# Patient Record
Sex: Female | Born: 1965
Health system: Southern US, Community
[De-identification: ages and names within clinical notes are randomized; demographics above are authoritative.]

## PROBLEM LIST (undated history)

## (undated) ENCOUNTER — Emergency Department (HOSPITAL_COMMUNITY): Payer: Medicaid Other | Source: Home / Self Care

## (undated) DIAGNOSIS — E7800 Pure hypercholesterolemia, unspecified: Secondary | ICD-10-CM

## (undated) DIAGNOSIS — C801 Malignant (primary) neoplasm, unspecified: Secondary | ICD-10-CM

## (undated) DIAGNOSIS — Z9289 Personal history of other medical treatment: Secondary | ICD-10-CM

## (undated) DIAGNOSIS — G4730 Sleep apnea, unspecified: Secondary | ICD-10-CM

## (undated) DIAGNOSIS — D649 Anemia, unspecified: Secondary | ICD-10-CM

## (undated) DIAGNOSIS — E1121 Type 2 diabetes mellitus with diabetic nephropathy: Secondary | ICD-10-CM

## (undated) DIAGNOSIS — I890 Lymphedema, not elsewhere classified: Secondary | ICD-10-CM

## (undated) DIAGNOSIS — I2510 Atherosclerotic heart disease of native coronary artery without angina pectoris: Secondary | ICD-10-CM

## (undated) DIAGNOSIS — J449 Chronic obstructive pulmonary disease, unspecified: Secondary | ICD-10-CM

## (undated) DIAGNOSIS — T4145XA Adverse effect of unspecified anesthetic, initial encounter: Secondary | ICD-10-CM

## (undated) DIAGNOSIS — J45909 Unspecified asthma, uncomplicated: Secondary | ICD-10-CM

## (undated) DIAGNOSIS — I10 Essential (primary) hypertension: Secondary | ICD-10-CM

## (undated) DIAGNOSIS — N185 Chronic kidney disease, stage 5: Secondary | ICD-10-CM

## (undated) DIAGNOSIS — E662 Morbid (severe) obesity with alveolar hypoventilation: Secondary | ICD-10-CM

## (undated) DIAGNOSIS — E119 Type 2 diabetes mellitus without complications: Secondary | ICD-10-CM

## (undated) DIAGNOSIS — I272 Pulmonary hypertension, unspecified: Secondary | ICD-10-CM

## (undated) DIAGNOSIS — L0390 Cellulitis, unspecified: Secondary | ICD-10-CM

## (undated) DIAGNOSIS — J189 Pneumonia, unspecified organism: Secondary | ICD-10-CM

## (undated) DIAGNOSIS — E1165 Type 2 diabetes mellitus with hyperglycemia: Secondary | ICD-10-CM

## (undated) DIAGNOSIS — IMO0001 Reserved for inherently not codable concepts without codable children: Secondary | ICD-10-CM

## (undated) DIAGNOSIS — I509 Heart failure, unspecified: Secondary | ICD-10-CM

## (undated) DIAGNOSIS — I129 Hypertensive chronic kidney disease with stage 1 through stage 4 chronic kidney disease, or unspecified chronic kidney disease: Secondary | ICD-10-CM

## (undated) DIAGNOSIS — K659 Peritonitis, unspecified: Secondary | ICD-10-CM

## (undated) DIAGNOSIS — M797 Fibromyalgia: Secondary | ICD-10-CM

## (undated) DIAGNOSIS — G935 Compression of brain: Secondary | ICD-10-CM

## (undated) DIAGNOSIS — G8929 Other chronic pain: Secondary | ICD-10-CM

## (undated) DIAGNOSIS — E785 Hyperlipidemia, unspecified: Secondary | ICD-10-CM

## (undated) DIAGNOSIS — Z9889 Other specified postprocedural states: Secondary | ICD-10-CM

## (undated) DIAGNOSIS — M1990 Unspecified osteoarthritis, unspecified site: Secondary | ICD-10-CM

## (undated) DIAGNOSIS — T7840XA Allergy, unspecified, initial encounter: Secondary | ICD-10-CM

## (undated) DIAGNOSIS — N289 Disorder of kidney and ureter, unspecified: Secondary | ICD-10-CM

## (undated) DIAGNOSIS — N189 Chronic kidney disease, unspecified: Secondary | ICD-10-CM

## (undated) DIAGNOSIS — IMO0002 Reserved for concepts with insufficient information to code with codable children: Secondary | ICD-10-CM

## (undated) DIAGNOSIS — F419 Anxiety disorder, unspecified: Secondary | ICD-10-CM

## (undated) DIAGNOSIS — I120 Hypertensive chronic kidney disease with stage 5 chronic kidney disease or end stage renal disease: Secondary | ICD-10-CM

## (undated) DIAGNOSIS — K5900 Constipation, unspecified: Secondary | ICD-10-CM

## (undated) DIAGNOSIS — Z9981 Dependence on supplemental oxygen: Secondary | ICD-10-CM

## (undated) HISTORY — DX: Lymphedema, not elsewhere classified: I89.0

## (undated) HISTORY — DX: Allergy, unspecified, initial encounter: T78.40XA

## (undated) HISTORY — DX: Anemia, unspecified: D64.9

## (undated) HISTORY — DX: Hyperlipidemia, unspecified: E78.5

## (undated) HISTORY — DX: Peritonitis, unspecified: K65.9

## (undated) HISTORY — DX: Chronic obstructive pulmonary disease, unspecified: J44.9

## (undated) HISTORY — DX: Type 2 diabetes mellitus with hyperglycemia: E11.65

## (undated) HISTORY — PX: THYROIDECTOMY, PARTIAL: SHX18

## (undated) HISTORY — DX: Essential (primary) hypertension: I10

## (undated) HISTORY — DX: Hypertensive chronic kidney disease with stage 1 through stage 4 chronic kidney disease, or unspecified chronic kidney disease: I12.9

## (undated) HISTORY — PX: ABDOMINAL HYSTERECTOMY: SHX81

## (undated) HISTORY — DX: Type 2 diabetes mellitus without complications: E11.9

## (undated) HISTORY — DX: Morbid (severe) obesity due to excess calories: E66.01

## (undated) HISTORY — DX: Unspecified osteoarthritis, unspecified site: M19.90

## (undated) HISTORY — PX: ADENOIDECTOMY: SUR15

## (undated) HISTORY — DX: Chronic kidney disease, stage 5: N18.5

## (undated) HISTORY — DX: Type 2 diabetes mellitus with diabetic nephropathy: E11.21

## (undated) HISTORY — PX: TONSILLECTOMY: SUR1361

## (undated) HISTORY — DX: Hypertensive chronic kidney disease with stage 5 chronic kidney disease or end stage renal disease: I12.0

## (undated) HISTORY — DX: Chronic kidney disease, unspecified: N18.9

## (undated) HISTORY — PX: OTHER SURGICAL HISTORY: SHX169

## (undated) HISTORY — PX: CRANIECTOMY SUBOCCIPITAL W/ CERVICAL LAMINECTOMY / CHIARI: SUR327

## (undated) HISTORY — DX: Sleep apnea, unspecified: G47.30

## (undated) HISTORY — DX: Compression of brain: G93.5

---

## 2001-04-29 ENCOUNTER — Emergency Department (HOSPITAL_COMMUNITY): Admission: EM | Admit: 2001-04-29 | Discharge: 2001-04-29 | Payer: Self-pay | Admitting: *Deleted

## 2001-07-03 ENCOUNTER — Emergency Department (HOSPITAL_COMMUNITY): Admission: EM | Admit: 2001-07-03 | Discharge: 2001-07-03 | Payer: Self-pay | Admitting: *Deleted

## 2001-07-03 ENCOUNTER — Encounter: Payer: Self-pay | Admitting: *Deleted

## 2002-02-08 ENCOUNTER — Emergency Department (HOSPITAL_COMMUNITY): Admission: EM | Admit: 2002-02-08 | Discharge: 2002-02-08 | Payer: Self-pay | Admitting: Emergency Medicine

## 2002-04-10 ENCOUNTER — Encounter: Admission: RE | Admit: 2002-04-10 | Discharge: 2002-05-02 | Payer: Self-pay | Admitting: Pulmonary Disease

## 2002-08-27 ENCOUNTER — Ambulatory Visit (HOSPITAL_COMMUNITY): Admission: RE | Admit: 2002-08-27 | Discharge: 2002-08-27 | Payer: Self-pay | Admitting: Pulmonary Disease

## 2002-10-15 ENCOUNTER — Ambulatory Visit (HOSPITAL_COMMUNITY): Admission: RE | Admit: 2002-10-15 | Discharge: 2002-10-15 | Payer: Self-pay | Admitting: Pulmonary Disease

## 2002-10-28 ENCOUNTER — Emergency Department (HOSPITAL_COMMUNITY): Admission: EM | Admit: 2002-10-28 | Discharge: 2002-10-28 | Payer: Self-pay | Admitting: Emergency Medicine

## 2002-10-28 ENCOUNTER — Encounter: Payer: Self-pay | Admitting: Emergency Medicine

## 2002-12-21 ENCOUNTER — Encounter: Payer: Self-pay | Admitting: Emergency Medicine

## 2002-12-21 ENCOUNTER — Inpatient Hospital Stay (HOSPITAL_COMMUNITY): Admission: EM | Admit: 2002-12-21 | Discharge: 2002-12-27 | Payer: Self-pay | Admitting: Emergency Medicine

## 2003-02-07 ENCOUNTER — Emergency Department (HOSPITAL_COMMUNITY): Admission: EM | Admit: 2003-02-07 | Discharge: 2003-02-08 | Payer: Self-pay | Admitting: *Deleted

## 2003-04-03 ENCOUNTER — Emergency Department (HOSPITAL_COMMUNITY): Admission: EM | Admit: 2003-04-03 | Discharge: 2003-04-03 | Payer: Self-pay | Admitting: Internal Medicine

## 2003-04-03 ENCOUNTER — Encounter: Payer: Self-pay | Admitting: Internal Medicine

## 2003-04-14 ENCOUNTER — Emergency Department (HOSPITAL_COMMUNITY): Admission: EM | Admit: 2003-04-14 | Discharge: 2003-04-14 | Payer: Self-pay | Admitting: *Deleted

## 2003-04-25 ENCOUNTER — Emergency Department (HOSPITAL_COMMUNITY): Admission: EM | Admit: 2003-04-25 | Discharge: 2003-04-25 | Payer: Self-pay | Admitting: Emergency Medicine

## 2003-05-01 ENCOUNTER — Encounter: Payer: Self-pay | Admitting: Otolaryngology

## 2003-05-01 ENCOUNTER — Ambulatory Visit (HOSPITAL_COMMUNITY): Admission: RE | Admit: 2003-05-01 | Discharge: 2003-05-01 | Payer: Self-pay | Admitting: Otolaryngology

## 2003-05-02 ENCOUNTER — Inpatient Hospital Stay (HOSPITAL_COMMUNITY): Admission: AD | Admit: 2003-05-02 | Discharge: 2003-05-05 | Payer: Self-pay | Admitting: Otolaryngology

## 2003-05-02 ENCOUNTER — Encounter (INDEPENDENT_AMBULATORY_CARE_PROVIDER_SITE_OTHER): Payer: Self-pay | Admitting: Specialist

## 2003-05-02 ENCOUNTER — Encounter: Payer: Self-pay | Admitting: Otolaryngology

## 2003-11-23 ENCOUNTER — Emergency Department (HOSPITAL_COMMUNITY): Admission: EM | Admit: 2003-11-23 | Discharge: 2003-11-23 | Payer: Self-pay | Admitting: Emergency Medicine

## 2004-01-02 ENCOUNTER — Ambulatory Visit (HOSPITAL_COMMUNITY): Admission: RE | Admit: 2004-01-02 | Discharge: 2004-01-02 | Payer: Self-pay | Admitting: Pulmonary Disease

## 2004-01-16 ENCOUNTER — Ambulatory Visit (HOSPITAL_COMMUNITY): Admission: RE | Admit: 2004-01-16 | Discharge: 2004-01-16 | Payer: Self-pay | Admitting: Pulmonary Disease

## 2004-01-29 ENCOUNTER — Encounter (HOSPITAL_COMMUNITY): Admission: RE | Admit: 2004-01-29 | Discharge: 2004-02-28 | Payer: Self-pay | Admitting: Orthopedic Surgery

## 2004-01-31 ENCOUNTER — Ambulatory Visit (HOSPITAL_COMMUNITY): Admission: RE | Admit: 2004-01-31 | Discharge: 2004-01-31 | Payer: Self-pay | Admitting: Orthopedic Surgery

## 2004-04-19 ENCOUNTER — Emergency Department (HOSPITAL_COMMUNITY): Admission: EM | Admit: 2004-04-19 | Discharge: 2004-04-19 | Payer: Self-pay | Admitting: Emergency Medicine

## 2004-07-15 ENCOUNTER — Ambulatory Visit (HOSPITAL_COMMUNITY): Admission: RE | Admit: 2004-07-15 | Discharge: 2004-07-15 | Payer: Self-pay | Admitting: Pulmonary Disease

## 2004-07-31 ENCOUNTER — Ambulatory Visit (HOSPITAL_COMMUNITY): Admission: RE | Admit: 2004-07-31 | Discharge: 2004-07-31 | Payer: Self-pay | Admitting: Pulmonary Disease

## 2004-09-20 ENCOUNTER — Emergency Department (HOSPITAL_COMMUNITY): Admission: EM | Admit: 2004-09-20 | Discharge: 2004-09-20 | Payer: Self-pay | Admitting: Emergency Medicine

## 2004-12-22 ENCOUNTER — Emergency Department (HOSPITAL_COMMUNITY): Admission: EM | Admit: 2004-12-22 | Discharge: 2004-12-22 | Payer: Self-pay | Admitting: Emergency Medicine

## 2005-07-26 ENCOUNTER — Ambulatory Visit (HOSPITAL_COMMUNITY): Admission: RE | Admit: 2005-07-26 | Discharge: 2005-07-26 | Payer: Self-pay | Admitting: Pulmonary Disease

## 2005-07-26 ENCOUNTER — Ambulatory Visit: Payer: Self-pay | Admitting: Cardiology

## 2005-09-12 ENCOUNTER — Emergency Department (HOSPITAL_COMMUNITY): Admission: EM | Admit: 2005-09-12 | Discharge: 2005-09-12 | Payer: Self-pay | Admitting: Emergency Medicine

## 2005-09-14 ENCOUNTER — Emergency Department (HOSPITAL_COMMUNITY): Admission: EM | Admit: 2005-09-14 | Discharge: 2005-09-14 | Payer: Self-pay | Admitting: Emergency Medicine

## 2005-09-15 ENCOUNTER — Emergency Department (HOSPITAL_COMMUNITY): Admission: EM | Admit: 2005-09-15 | Discharge: 2005-09-15 | Payer: Self-pay | Admitting: Emergency Medicine

## 2006-05-20 ENCOUNTER — Inpatient Hospital Stay (HOSPITAL_COMMUNITY): Admission: AD | Admit: 2006-05-20 | Discharge: 2006-05-26 | Payer: Self-pay | Admitting: Cardiology

## 2006-05-23 ENCOUNTER — Ambulatory Visit: Payer: Self-pay | Admitting: Infectious Diseases

## 2006-07-03 ENCOUNTER — Emergency Department (HOSPITAL_COMMUNITY): Admission: EM | Admit: 2006-07-03 | Discharge: 2006-07-03 | Payer: Self-pay | Admitting: Emergency Medicine

## 2006-10-28 ENCOUNTER — Emergency Department (HOSPITAL_COMMUNITY): Admission: EM | Admit: 2006-10-28 | Discharge: 2006-10-28 | Payer: Self-pay | Admitting: Emergency Medicine

## 2007-01-31 ENCOUNTER — Emergency Department (HOSPITAL_COMMUNITY): Admission: EM | Admit: 2007-01-31 | Discharge: 2007-01-31 | Payer: Self-pay | Admitting: Emergency Medicine

## 2007-05-17 ENCOUNTER — Ambulatory Visit: Payer: Self-pay | Admitting: Internal Medicine

## 2007-05-19 ENCOUNTER — Emergency Department (HOSPITAL_COMMUNITY): Admission: EM | Admit: 2007-05-19 | Discharge: 2007-05-19 | Payer: Self-pay | Admitting: Emergency Medicine

## 2007-05-26 ENCOUNTER — Encounter (INDEPENDENT_AMBULATORY_CARE_PROVIDER_SITE_OTHER): Payer: Self-pay | Admitting: Internal Medicine

## 2007-05-26 ENCOUNTER — Ambulatory Visit (HOSPITAL_COMMUNITY): Admission: RE | Admit: 2007-05-26 | Discharge: 2007-05-26 | Payer: Self-pay | Admitting: Internal Medicine

## 2007-05-26 ENCOUNTER — Ambulatory Visit: Payer: Self-pay | Admitting: Internal Medicine

## 2007-05-26 DIAGNOSIS — Z9889 Other specified postprocedural states: Secondary | ICD-10-CM

## 2007-05-26 HISTORY — DX: Other specified postprocedural states: Z98.890

## 2007-10-06 ENCOUNTER — Ambulatory Visit (HOSPITAL_COMMUNITY): Admission: RE | Admit: 2007-10-06 | Discharge: 2007-10-06 | Payer: Self-pay | Admitting: Pulmonary Disease

## 2008-08-18 ENCOUNTER — Emergency Department (HOSPITAL_COMMUNITY): Admission: EM | Admit: 2008-08-18 | Discharge: 2008-08-18 | Payer: Self-pay | Admitting: Emergency Medicine

## 2009-04-27 ENCOUNTER — Inpatient Hospital Stay (HOSPITAL_COMMUNITY): Admission: EM | Admit: 2009-04-27 | Discharge: 2009-05-01 | Payer: Self-pay | Admitting: Emergency Medicine

## 2009-04-28 ENCOUNTER — Other Ambulatory Visit: Payer: Self-pay | Admitting: Cardiovascular Disease

## 2009-04-29 ENCOUNTER — Other Ambulatory Visit: Payer: Self-pay | Admitting: Cardiovascular Disease

## 2009-04-30 ENCOUNTER — Other Ambulatory Visit: Payer: Self-pay | Admitting: Cardiovascular Disease

## 2009-05-01 ENCOUNTER — Other Ambulatory Visit: Payer: Self-pay | Admitting: Cardiovascular Disease

## 2009-05-01 DIAGNOSIS — Z9889 Other specified postprocedural states: Secondary | ICD-10-CM

## 2009-05-01 HISTORY — DX: Other specified postprocedural states: Z98.890

## 2009-09-22 ENCOUNTER — Emergency Department (HOSPITAL_COMMUNITY): Admission: EM | Admit: 2009-09-22 | Discharge: 2009-09-22 | Payer: Self-pay | Admitting: Emergency Medicine

## 2009-10-20 ENCOUNTER — Emergency Department (HOSPITAL_COMMUNITY): Admission: EM | Admit: 2009-10-20 | Discharge: 2009-10-21 | Payer: Self-pay | Admitting: Emergency Medicine

## 2010-02-27 ENCOUNTER — Emergency Department (HOSPITAL_COMMUNITY): Admission: EM | Admit: 2010-02-27 | Discharge: 2010-02-27 | Payer: Self-pay | Admitting: Emergency Medicine

## 2010-03-02 ENCOUNTER — Emergency Department (HOSPITAL_COMMUNITY): Admission: EM | Admit: 2010-03-02 | Discharge: 2010-03-02 | Payer: Self-pay | Admitting: Emergency Medicine

## 2010-05-27 ENCOUNTER — Ambulatory Visit: Payer: Self-pay | Admitting: Cardiology

## 2010-05-27 ENCOUNTER — Observation Stay (HOSPITAL_COMMUNITY): Admission: EM | Admit: 2010-05-27 | Discharge: 2010-05-30 | Payer: Self-pay | Admitting: Emergency Medicine

## 2010-05-28 ENCOUNTER — Encounter (INDEPENDENT_AMBULATORY_CARE_PROVIDER_SITE_OTHER): Payer: Self-pay | Admitting: Pulmonary Disease

## 2010-10-03 ENCOUNTER — Emergency Department (HOSPITAL_COMMUNITY)
Admission: EM | Admit: 2010-10-03 | Discharge: 2010-10-04 | Payer: Self-pay | Source: Home / Self Care | Admitting: Emergency Medicine

## 2011-01-10 ENCOUNTER — Encounter: Payer: Self-pay | Admitting: Pulmonary Disease

## 2011-03-03 LAB — BASIC METABOLIC PANEL
GFR calc non Af Amer: >60 mL/min (ref >60)
Potassium: 3.7 mEq/L (ref 3.5–5.1)
Sodium: 133 mEq/L — ABNORMAL LOW (ref 135–145)

## 2011-03-03 LAB — URINE MICROSCOPIC-ADD ON

## 2011-03-03 LAB — CBC
HCT: 38 % (ref 36.0–46.0)
Hemoglobin: 12.6 g/dL (ref 12.0–15.0)
RBC: 4.19 MIL/uL (ref 3.87–5.11)
RDW: 14.1 % (ref 11.5–15.5)
WBC: 7.3 10*3/uL (ref 4.0–10.5)

## 2011-03-03 LAB — URINALYSIS, ROUTINE W REFLEX MICROSCOPIC
Bilirubin Urine: NEGATIVE
Glucose, UA: >1000 mg/dL — AB
Ketones, ur: NEGATIVE mg/dL
pH: 6 (ref 5.0–8.0)

## 2011-03-03 LAB — DIFFERENTIAL
Basophils Absolute: 0 10*3/uL (ref 0.0–0.1)
Lymphocytes Relative: 30 % (ref 12–46)
Monocytes Absolute: 0.5 10*3/uL (ref 0.1–1.0)
Monocytes Relative: 7 % (ref 3–12)
Neutro Abs: 4.4 10*3/uL (ref 1.7–7.7)

## 2011-03-08 LAB — GLUCOSE, CAPILLARY
Glucose-Capillary: 237 mg/dL — ABNORMAL HIGH (ref 70–99)
Glucose-Capillary: 264 mg/dL — ABNORMAL HIGH (ref 70–99)
Glucose-Capillary: 269 mg/dL — ABNORMAL HIGH (ref 70–99)
Glucose-Capillary: 272 mg/dL — ABNORMAL HIGH (ref 70–99)
Glucose-Capillary: 290 mg/dL — ABNORMAL HIGH (ref 70–99)
Glucose-Capillary: 313 mg/dL — ABNORMAL HIGH (ref 70–99)

## 2011-03-08 LAB — D-DIMER, QUANTITATIVE: D-Dimer, Quant: 0.3 ug/mL-FEU (ref 0.00–0.48)

## 2011-03-08 LAB — BASIC METABOLIC PANEL
BUN: 13 mg/dL (ref 6–23)
BUN: 6 mg/dL (ref 6–23)
CO2: 34 mEq/L — ABNORMAL HIGH (ref 19–32)
Chloride: 95 mEq/L — ABNORMAL LOW (ref 96–112)
Chloride: 99 mEq/L (ref 96–112)
Creatinine, Ser: 0.98 mg/dL (ref 0.4–1.2)
GFR calc Af Amer: >60 mL/min (ref >60)
Potassium: 4 mEq/L (ref 3.5–5.1)
Potassium: 4.1 mEq/L (ref 3.5–5.1)

## 2011-03-08 LAB — POCT CARDIAC MARKERS
Myoglobin, poc: 86.9 ng/mL (ref 12–200)
Troponin i, poc: <0.05 ng/mL (ref 0.00–0.09)

## 2011-03-08 LAB — DIFFERENTIAL
Basophils Absolute: 0 10*3/uL (ref 0.0–0.1)
Basophils Relative: 1 % (ref 0–1)
Eosinophils Absolute: 0.2 10*3/uL (ref 0.0–0.7)
Eosinophils Relative: 2 % (ref 0–5)
Monocytes Absolute: 0.6 10*3/uL (ref 0.1–1.0)
Neutro Abs: 4.7 10*3/uL (ref 1.7–7.7)

## 2011-03-08 LAB — CBC
HCT: 37.9 % (ref 36.0–46.0)
Hemoglobin: 12.6 g/dL (ref 12.0–15.0)
MCHC: 33.1 g/dL (ref 30.0–36.0)
MCV: 88.9 fL (ref 78.0–100.0)
RDW: 14.9 % (ref 11.5–15.5)

## 2011-03-08 LAB — BRAIN NATRIURETIC PEPTIDE: Pro B Natriuretic peptide (BNP): <30 pg/mL (ref 0.0–100.0)

## 2011-03-08 LAB — URINALYSIS, ROUTINE W REFLEX MICROSCOPIC
Bilirubin Urine: NEGATIVE
Ketones, ur: NEGATIVE mg/dL
Nitrite: NEGATIVE
Protein, ur: >300 mg/dL — AB
Urobilinogen, UA: 0.2 mg/dL (ref 0.0–1.0)

## 2011-03-08 LAB — HEMOGLOBIN A1C: Hgb A1c MFr Bld: 11.6 % — ABNORMAL HIGH (ref <5.7)

## 2011-03-12 LAB — BASIC METABOLIC PANEL
Calcium: 9.1 mg/dL (ref 8.4–10.5)
GFR calc Af Amer: >60 mL/min (ref >60)
GFR calc non Af Amer: >60 mL/min (ref >60)
Glucose, Bld: 285 mg/dL — ABNORMAL HIGH (ref 70–99)
Potassium: 4 mEq/L (ref 3.5–5.1)
Sodium: 135 mEq/L (ref 135–145)

## 2011-03-12 LAB — CBC
HCT: 36 % (ref 36.0–46.0)
Hemoglobin: 12.2 g/dL (ref 12.0–15.0)
RBC: 4.07 MIL/uL (ref 3.87–5.11)
RDW: 15 % (ref 11.5–15.5)
WBC: 7.5 10*3/uL (ref 4.0–10.5)

## 2011-03-12 LAB — DIFFERENTIAL
Eosinophils Relative: 3 % (ref 0–5)
Lymphocytes Relative: 23 % (ref 12–46)
Lymphs Abs: 1.7 10*3/uL (ref 0.7–4.0)
Monocytes Absolute: 0.5 10*3/uL (ref 0.1–1.0)

## 2011-03-12 LAB — BRAIN NATRIURETIC PEPTIDE: Pro B Natriuretic peptide (BNP): <30 pg/mL (ref 0.0–100.0)

## 2011-03-24 LAB — URINE MICROSCOPIC-ADD ON

## 2011-03-24 LAB — URINALYSIS, ROUTINE W REFLEX MICROSCOPIC
Glucose, UA: NEGATIVE mg/dL
Leukocytes, UA: NEGATIVE
Protein, ur: >300 mg/dL — AB
pH: 7 (ref 5.0–8.0)

## 2011-03-24 LAB — URINE CULTURE: Colony Count: 15000

## 2011-03-30 LAB — LIPID PANEL
HDL: 37 mg/dL — ABNORMAL LOW (ref >39)
LDL Cholesterol: 86 mg/dL (ref 0–99)
Triglycerides: 220 mg/dL — ABNORMAL HIGH (ref <150)

## 2011-03-30 LAB — CBC
HCT: 34.6 % — ABNORMAL LOW (ref 36.0–46.0)
HCT: 34.8 % — ABNORMAL LOW (ref 36.0–46.0)
Hemoglobin: 11.6 g/dL — ABNORMAL LOW (ref 12.0–15.0)
Hemoglobin: 11.9 g/dL — ABNORMAL LOW (ref 12.0–15.0)
Hemoglobin: 12.6 g/dL (ref 12.0–15.0)
MCHC: 33.6 g/dL (ref 30.0–36.0)
MCHC: 33.8 g/dL (ref 30.0–36.0)
MCV: 88.6 fL (ref 78.0–100.0)
MCV: 89.9 fL (ref 78.0–100.0)
Platelets: 260 10*3/uL (ref 150–400)
Platelets: 262 K/uL (ref 150–400)
RBC: 3.85 MIL/uL — ABNORMAL LOW (ref 3.87–5.11)
RBC: 3.93 MIL/uL (ref 3.87–5.11)
RBC: 4.14 MIL/uL (ref 3.87–5.11)
RDW: 15.5 % (ref 11.5–15.5)
WBC: 8.4 10*3/uL (ref 4.0–10.5)
WBC: 8.4 10*3/uL (ref 4.0–10.5)
WBC: 9.7 K/uL (ref 4.0–10.5)

## 2011-03-30 LAB — CARDIAC PANEL(CRET KIN+CKTOT+MB+TROPI)
CK, MB: 0.6 ng/mL (ref 0.3–4.0)
CK, MB: 0.7 ng/mL (ref 0.3–4.0)
CK, MB: 0.9 ng/mL (ref 0.3–4.0)
CK, MB: 1.1 ng/mL (ref 0.3–4.0)
Relative Index: 0.8 (ref 0.0–2.5)
Relative Index: 0.9 (ref 0.0–2.5)
Relative Index: INVALID (ref 0.0–2.5)
Total CK: 119 U/L (ref 7–177)
Total CK: 93 U/L (ref 7–177)
Total CK: 98 U/L (ref 7–177)
Troponin I: 0.01 ng/mL (ref 0.00–0.06)
Troponin I: 0.02 ng/mL (ref 0.00–0.06)
Troponin I: 0.03 ng/mL (ref 0.00–0.06)
Troponin I: 0.03 ng/mL (ref 0.00–0.06)

## 2011-03-30 LAB — DIFFERENTIAL
Band Neutrophils: 0 % (ref 0–10)
Lymphocytes Relative: 24 % (ref 12–46)
Lymphs Abs: 2 10*3/uL (ref 0.7–4.0)
Monocytes Relative: 7 % (ref 3–12)
Neutro Abs: 5.6 10*3/uL (ref 1.7–7.7)

## 2011-03-30 LAB — URINALYSIS, ROUTINE W REFLEX MICROSCOPIC
Bilirubin Urine: NEGATIVE
Ketones, ur: NEGATIVE mg/dL
Leukocytes, UA: NEGATIVE
Nitrite: NEGATIVE
Protein, ur: >300 mg/dL — AB

## 2011-03-30 LAB — COMPREHENSIVE METABOLIC PANEL
Alkaline Phosphatase: 83 U/L (ref 39–117)
BUN: 16 mg/dL (ref 6–23)
CO2: 31 mEq/L (ref 19–32)
Chloride: 97 mEq/L (ref 96–112)
Creatinine, Ser: 0.96 mg/dL (ref 0.4–1.2)
GFR calc non Af Amer: >60 mL/min (ref >60)
Glucose, Bld: 153 mg/dL — ABNORMAL HIGH (ref 70–99)
Potassium: 3.2 mEq/L — ABNORMAL LOW (ref 3.5–5.1)
Total Bilirubin: 0.4 mg/dL (ref 0.3–1.2)

## 2011-03-30 LAB — GLUCOSE, CAPILLARY
Glucose-Capillary: 100 mg/dL — ABNORMAL HIGH (ref 70–99)
Glucose-Capillary: 106 mg/dL — ABNORMAL HIGH (ref 70–99)
Glucose-Capillary: 127 mg/dL — ABNORMAL HIGH (ref 70–99)
Glucose-Capillary: 135 mg/dL — ABNORMAL HIGH (ref 70–99)
Glucose-Capillary: 139 mg/dL — ABNORMAL HIGH (ref 70–99)
Glucose-Capillary: 142 mg/dL — ABNORMAL HIGH (ref 70–99)
Glucose-Capillary: 143 mg/dL — ABNORMAL HIGH (ref 70–99)
Glucose-Capillary: 144 mg/dL — ABNORMAL HIGH (ref 70–99)
Glucose-Capillary: 154 mg/dL — ABNORMAL HIGH (ref 70–99)
Glucose-Capillary: 158 mg/dL — ABNORMAL HIGH (ref 70–99)
Glucose-Capillary: 164 mg/dL — ABNORMAL HIGH (ref 70–99)
Glucose-Capillary: 168 mg/dL — ABNORMAL HIGH (ref 70–99)
Glucose-Capillary: 207 mg/dL — ABNORMAL HIGH (ref 70–99)
Glucose-Capillary: 272 mg/dL — ABNORMAL HIGH (ref 70–99)
Glucose-Capillary: 289 mg/dL — ABNORMAL HIGH (ref 70–99)

## 2011-03-30 LAB — POCT CARDIAC MARKERS
CKMB, poc: 1.4 ng/mL (ref 1.0–8.0)
Myoglobin, poc: 120 ng/mL (ref 12–200)
Troponin i, poc: <0.05 ng/mL (ref 0.00–0.09)

## 2011-03-30 LAB — BASIC METABOLIC PANEL
CO2: 33 mEq/L — ABNORMAL HIGH (ref 19–32)
Calcium: 9.2 mg/dL (ref 8.4–10.5)
GFR calc Af Amer: >60 mL/min (ref >60)
GFR calc non Af Amer: >60 mL/min (ref >60)
Sodium: 139 mEq/L (ref 135–145)

## 2011-03-30 LAB — TSH: TSH: 5.048 u[IU]/mL — ABNORMAL HIGH (ref 0.350–4.500)

## 2011-03-30 LAB — BASIC METABOLIC PANEL WITH GFR
BUN: 13 mg/dL (ref 6–23)
CO2: 31 meq/L (ref 19–32)
Calcium: 8.9 mg/dL (ref 8.4–10.5)
Chloride: 96 meq/L (ref 96–112)
Creatinine, Ser: 0.74 mg/dL (ref 0.4–1.2)
GFR calc Af Amer: >60 mL/min (ref >60)
GFR calc non Af Amer: >60 mL/min (ref >60)
Glucose, Bld: 218 mg/dL — ABNORMAL HIGH (ref 70–99)
Potassium: 3.7 meq/L (ref 3.5–5.1)
Sodium: 137 meq/L (ref 135–145)

## 2011-03-30 LAB — PREGNANCY, URINE: Preg Test, Ur: NEGATIVE

## 2011-03-30 LAB — BRAIN NATRIURETIC PEPTIDE: Pro B Natriuretic peptide (BNP): <30 pg/mL (ref 0.0–100.0)

## 2011-03-30 LAB — CLOTEST (H. PYLORI), BIOPSY: Helicobacter screen: NEGATIVE

## 2011-03-30 LAB — HEMOGLOBIN A1C: Mean Plasma Glucose: 226 mg/dL

## 2011-03-30 LAB — MAGNESIUM: Magnesium: 1.8 mg/dL (ref 1.5–2.5)

## 2011-03-30 LAB — HEPARIN LEVEL (UNFRACTIONATED): Heparin Unfractionated: 0.5 IU/mL (ref 0.30–0.70)

## 2011-05-04 NOTE — Discharge Summary (Signed)
[name], [name]              ACCOUNT NO.:  [id number]   MEDICAL RECORD NO.:  [id number]          PATIENT TYPE:  INP   LOCATION:  3736                         FACILITY:  [place]   PHYSICIAN:  Quay Burow, M.D.   DATE OF BIRTH:  1966-08-22   DATE OF ADMISSION:  04/28/2009  DATE OF DISCHARGE:  05/01/2009                               DISCHARGE SUMMARY   DISCHARGE DIAGNOSES:  1. Chest pain related status post cardiac catheterization with only 40-      50% lesion in her mid left anterior descending, not much change      from cath in 2007.  2. Seen by Gastrointestinal at this admission Dr. Penelope Coop.  She      underwent EGD.  She was found to have esophageal ulcers several.      She also had some small gastric ulcers.  Recommendations are to      treat with Carafate and proton pump inhibitor.  3. Hypertension.  4. Insulin-dependent diabetes mellitus.  This.  That was -4 was  5. Chronic pain.   LABORATORIES:  Sodium 137, potassium 3.7, BUN 13, creatinine 0.74, and  glucose 218.  Hemoglobin 1.6, hematocrit 34.6, WBCs 9.7, and platelets  262.  CK-MBs and troponins were negative.  Hemoglobin A1c was 9.5.  TSH  5.048.  Total cholesterol is 167, triglycerides 220, HDL of 37, and LDL  was 86, magnesium was 1.8.  Urine pregnancy was negative.  BNP on admit  was less than 30 and D-dimer was 0.31.  Chest x-ray showed no acute  disease.  Procedures cardiac catheterization, Apr 29, 2009 and EGD May 01, 2009.   DISCHARGE MEDICATIONS:  1. Torsemide 100 mg a day.  2. Midazolam 5 mg a day.  3. Metanx daily.  4. Lortab 10/500 3 times a day.  5. Lyrica 75 mg a day.  6. Humalog insulin pump.  7. Coreg 3.125 mg twice per day.  8. Aspirin 81 mg a day.  9. K-Dur 20 mEq twice per day.  10.Carafate 1 g in 10 mL suspension 1 hour before meals and at      bedtime.  11.Prilosec 20 mg twice per day.   Other follow up includes seen Dr. Gwenlyn Found on May 16, 2009, and 2:00 p.m.  and she should see Dr.  Penelope Coop.  She should call to see him in  approximately 3-4 weeks.   HOSPITAL COURSE:  Ms. Kerchner went to [facility] with  complaints of chest pain.  She was admitted there, she ruled out for an  MI.  She was seen by Dr. Gwenlyn Found and it was decided that she should be  transferred for further cardiac eval.  Because of her cardiac risk  factors and previous mild disease, she underwent cardiac cath on Apr 29, 2009, which revealed only a 43-50% LAD stenosis.  Postprocedure, she  complained about troubles dysphasia and also odynophagia, a GI consult  was called.  She was seen by Dr. Penelope Coop, and he found out that she had  recently taken a doxycycline, he thought may be she had esophagitis from  this.  She underwent EGD on  May 01, 2009, which did reveal distal-to-mid esophageal ulcers  consistent with pill ulcers, doxycycline induced.  She had 2 small  ulcers in her antrum which they were biopsied for H. Pylori.  His  recommendations were to treat with PPI and Carafate.      Cyndia Bent, N.P.      Quay Burow, M.D.  Electronically Signed    BB/MEDQ  D:  05/01/2009  T:  05/02/2009  Job:  [id number]   cc:   Wonda Horner, M.D.  Edward L. [name], M.D.

## 2011-05-04 NOTE — Consult Note (Signed)
[name], [name]              ACCOUNT NO.:  [id number]   MEDICAL RECORD NO.:  [id number]          PATIENT TYPE:  INP   LOCATION:  3736                         FACILITY:  [place]   PHYSICIAN:  Wonda Horner, M.D.   DATE OF BIRTH:  [birth year]   DATE OF CONSULTATION:  04/30/2009  DATE OF DISCHARGE:                                 CONSULTATION   REASON FOR CONSULTATION:  Pain with swallowing.   HISTORY OF PRESENT ILLNESS:  This is a 45 year old female, past medical  history of diabetes, hypertension, coronary artery disease, and  significant lymph edema bilaterally, who is admitted 4 days ago for  chest pain, approximately 3 days ago she developed pain with swallowing  of both liquids and solids and also sporadic pain in her throat.  Pain  is described as an intense burning knot at the base of the neck which  then moves down beneath the sternum and spreads out bilaterally under  the breast.  She had no vomiting or regurgitation.  She has stopped  eating and is reluctant to drink because of pain.  She has no abdominal  pain, no constipation, or diarrhea.  She has never had these symptoms  before.  She has had thyroid and tonsil surgery with no complications.  In the past, she has had occasional nausea with large meals as well as  heartburn, both have been mild in nature.  Of note, she has recently  finished a course of doxycycline for presumed cellulitis and she has  been given aspirin during this hospitalization.   PAST MEDICAL HISTORY:  1. Coronary artery disease, status post cardiac catheterization on May      11th, which shows mild-to-moderate LAD disease, nonobstructive.  2. Diabetes.  3. Hypertension.  4. Chronic lymphedema of both legs.  5. Obesity.  6. Colonoscopy in June 2008 showing few small sigmoid diverticula,      otherwise normal.  7. Obstructive sleep apnea.  8. Hyperlipidemia.  9. Asthma.  10.Status post thyroid resection.  11.Status post hysterectomy.  12.Status post tonsillectomy.   ALLERGIES:  1. PNEUMOVAX causes pain and fever.  2. CONTRAST MEDIA causes anaphylaxis.   HOME MEDICATIONS:  1. Insulin pump.  2. Lisinopril 20 mg daily.  3. Torsemide 100 mg daily.  4. Metolazone 5 mg daily.  5. Lortab 10/500 three to four tablets daily as needed for pain.  6. Lyrica.  7. Recent course of doxycycline.   FAMILY HISTORY:  Positive for coronary artery disease, positive for  diabetes, positive for hypertension, and positive for father having  received esophageal stretching in the past.   SOCIAL HISTORY:  The patient denies tobacco, alcohol, or other drug use.  She lives with her husband.   REVIEW OF SYSTEMS:  No constipation or diarrhea.  No fevers or chills.  No confusion, dysarthria, or localized weakness.  No oral ulcers.  Other  systems reviewed and are negative.   PHYSICAL EXAMINATION:  VITAL SIGNS:  Temperature 98.5, blood pressure  142/94, pulse 88, and oxygen saturation 93% on room air.  HEENT:  Pupils equal, round, and reactive.  Extraocular motion  intact.  Sclerae clear.  Mucous membranes are moist.  There are no oral lesions.  CARDIOVASCULAR:  Regular rate and rhythm.  No murmurs, rubs, or gallops.  LUNGS:  Clear to auscultation bilaterally.  ABDOMEN:  Bowel sounds positive, not soft, and nontender.  EXTREMITIES:  Lymphedema, nonpitting, 2+ pulses.  NEUROLOGIC:  Nonfocal.  Cranial nerves II through X are intact.   LABORATORY DATA:  White blood cells 9.7, hemoglobin 11.6, hematocrit  34.6, and platelets 262.  Sodium 137, potassium 3.7, chloride 96, bicarb  31, BUN 13, creatinine 0.74, glucose 218, magnesium 1.8, and calcium  8.9.  LFTs normal with the exception of albumin, which is 3.1.  Hemoglobin A1c is 9.5.  TSH is 5.04.  Total cholesterol 137, LDL 86, and  HDL 37.  Cardiac enzymes have been negative.   ASSESSMENT AND PLAN:  1. Odynophagia: story is most consistent with pill esophagitis given      her recent  completion of doxycycline course, acute onset of      symptoms, exacerbation of symptoms with swallowing, and specific      localization of pain.  The patient has no history of      gastroesophageal reflux disease or peptic ulcer disease.  She has      had no regurgitation to suggest a mass lesion. Plan is for EGD in      the morning.  We will start Carafate 4 times daily and thick liquid      diet today.   Thank you for this consult.      Myrtis Ser, MD  Electronically Signed     ______________________________  Wonda Horner, M.D.    CW/MEDQ  D:  04/30/2009  T:  05/01/2009  Job:  [id number]

## 2011-05-04 NOTE — Op Note (Signed)
[name], [name]              ACCOUNT NO.:  [id number]   MEDICAL RECORD NO.:  [id number]          PATIENT TYPE:  AMB   LOCATION:  DAY                           FACILITY:  [facility]   PHYSICIAN:  [name], M.D.    DATE OF BIRTH:  12/15/1966   DATE OF PROCEDURE:  05/26/2007  DATE OF DISCHARGE:                               OPERATIVE REPORT   PROCEDURE:  Colonoscopy.   INDICATIONS:  [name] is a 45 year old African American female with  history of diabetes who has been having nonbloody diarrhea for several  months.  Lately she has had as many as 15 bowel movements per day.  She  has not responded to symptomatic therapy.  She is undergoing diagnostic  colonoscopy.  The procedure risks were reviewed the patient, and  informed consent was obtained.   MEDICATIONS FOR CONSCIOUS SEDATION:  Demerol 50 mg IV, Versed 6 mg IV.   FINDINGS:  The procedure was performed in the endoscopy suite.  The  patient's vital signs and O2 saturations were monitored during the  procedure and remained stable.  The patient was placed in the left  lateral recumbent position, and rectal examination was performed.  No  abnormality noted on external or digital exam.  The Pentax videoscope  was placed in the rectum and advanced under vision into the sigmoid  colon and beyond.  The preparation was excellent.  She had a few small  scattered diverticula at sigmoid colon.  The scope was passed into the  cecum which was identified by the appendiceal orifice and ileocecal  valve.  Pictures were taken for the record.  As the scope was withdrawn,  the colonic mucosa was once again carefully examined and was normal  throughout.  Random biopsies were taken from the distal sigmoid colon  for routine histology.  The rectal mucosa was normal.  The scope was  retroflexed to examine the anorectal junction which was unremarkable.  The endoscope was straightened and withdrawn.  The patient tolerated the  procedure well.   FINAL DIAGNOSIS:  Few small diverticula at sigmoid colon, otherwise  normal colonoscopy.   Random biopsies taken from sigmoid colon looking for collagenous or  microscopic colitis.   RECOMMENDATIONS:  1. FiberChoice 2 tablets daily.  2. Levbid 1 tablet every morning.  3. OTC Imodium 2 mg twice a day.  4. She will keep a symptom diary for the next 4 weeks.   I will be contacting the patient with results of the biopsy and further  recommendations.      [name], M.D.  Electronically Signed     NR/MEDQ  D:  05/26/2007  T:  05/26/2007  Job:  [id number]   cc:   Percell Miller L. [name], M.D.  Fax: [phone]

## 2011-05-04 NOTE — H&P (Signed)
NAMEDIEDRE, [name]              ACCOUNT NO.:  [id number]   MEDICAL RECORD NO.:  [id number]          PATIENT TYPE:  INP   LOCATION:  E081                          FACILITY:  [facility]   PHYSICIAN:  Edward L. [name], M.D.DATE OF BIRTH:  09/09/1966   DATE OF ADMISSION:  04/27/2009  DATE OF DISCHARGE:  LH                              HISTORY & PHYSICAL   REASON FOR ADMISSION:  Chest discomfort.   HISTORY:  [name] is a 45 year old African American female with a  long known history of diabetes, morbid obesity, and lymphedema of both  legs.  She came to the emergency room because she had chest discomfort  that was started at about 3 o'clock, it was in her midsternal area and  went into her left arm.  It has caused her to be short of breath.  She  says when she came to the emergency room she received nitroglycerin  paste and her pain totally resolved.  She tells me that it is a pressure-  like sensation and feels like she is choking and squeezing.  In the  emergency room, she said it only lasted for a few seconds each time and  that the history that she gives me that it is lasting longer than that.  She says that otherwise she has been doing okay.  Her blood sugars have  been better.  We have been trying to get her set up with a pain clinic  because she has significant neuropathic pain in her feet.   PAST MEDICAL HISTORY:  Positive for hypertension, diabetes, lymphedema,  sleep apnea, and neuropathy.   FAMILY HISTORY:  Positive for coronary artery occlusive disease in  multiple family members, diabetes, and hypertension.  She has had a C-  section, hysterectomy, thyroidectomy, tonsillectomy and cardiac  catheterization.  Her cardiac catheterization was done in 2007 and was  not totally normal, which showed nonobstructive coronary artery disease.   SOCIAL HISTORY:  She is married, lives at home with her husband.  She  does not use any alcohol.  She does not smoke.  She does not use  any  illicit drugs.   MEDICATIONS:  1. She is on insulin pump.  2. Lisinopril 20 mg daily.  3. Torsemide 100 mg daily.  4. Metolazone 5 mg daily.  5. Metanx once a day.  6. Lortab 3-4 times a day as needed.  7. I have recently started on Lyrica for her neuropathy.   PHYSICAL EXAMINATION:  GENERAL:  She is a well-developed, well-nourished  [place] American female who has appears to be in no acute distress right  now.  VITAL SIGNS:  Temperature is 98, pulse 89, respirations 20, and  blood pressure 183/103.  Her height 65 inches and weight 158.5 kilos.  HEENT:  Her pupils are reactive.  Her nose and throat are clear.  NECK:  Supple without masses.  CHEST:  Clear without wheezes, rales, or rhonchi.  HEART:  Regular without murmur, gallop, or rub.  EXTREMITIES:  An lymphedema.   White count is 8,400, hemoglobin is 12.6, and platelets 311.  Cardiac  markers point  of care are negative.  D-dimer was normal.  BMET; glucose  is 235.  BNP was less than 30.   ASSESSMENT:  She has chest discomfort with a previous history of a  nonobstructive coronary artery disease 3 years ago, but she is an  insulin-dependent diabetic.  I am going to see about trying to get a  Cardiology consultation.  I am going to add some Lopressor, add aspirin,  and put on heparin and she is having some discomfort again.      Edward L. [name], M.D.  Electronically Signed     ELH/MEDQ  D:  04/28/2009  T:  04/28/2009  Job:  [id number]

## 2011-05-04 NOTE — Cardiovascular Report (Signed)
NAMENATSUMI, [name]              ACCOUNT NO.:  [id number]   MEDICAL RECORD NO.:  [id number]           PATIENT TYPE:   LOCATION:                                 FACILITY:   PHYSICIAN:  Quay Burow, M.D.   DATE OF BIRTH:  1966/06/14   DATE OF PROCEDURE:  DATE OF DISCHARGE:                            CARDIAC CATHETERIZATION   Diagnostic coronary arteriogram.   Ms. Stabler is a 45 year old mildly overweight married African American  female, mother of 3 children, who has a history of noncritical CAD by  cath performed by Dr. Tami Ribas, May 24, 2006.  At that time, she had a  40% segmental mid LAD.  She does have cardiac risk factors including  insulin-dependant diabetes, hypertension and family history.  She was  admitted to [facility] 2 days ago with chest pain which began  while she was in church and continued off and on that day into the  following day.  She ruled out for myocardial infarction by isoenzymes.  She has negative D-dimer and BMP.  Her EKG showed nonspecific changes.  Because of her chest pain and ongoing risk factors, she was transferred  from [facility] to [facility] for diagnostic coronary arteriography  to define her anatomy and rule out an ischemic etiology.   PROCEDURE DESCRIPTION:  The patient was brought to the second floor of  [facility] in the postabsorptive state.  She was  premedicated with p.o. Valium, IV Benadryl, Pepcid, and Solu-Medrol for  CONTRAST allergy prophylaxis.  Her right groin was prepped and shaved in  the usual sterile fashion.  Xylocaine 1% was used for local anesthesia.  A 6-French sheath was inserted into the right femoral artery using  standard Seldinger technique.  A 6-French right and left Judkins  diagnostic catheter as well as a 6-French pigtail catheter were used for  selective coronary angiography and left ventriculography respectively.  Visipaque dye was used for the entirety of the case.  Retrograde  aortic,  left ventricular, and pullback pressures were recorded.   HEMODYNAMIC RESULTS:  1. Aortic systolic pressure 314, diastolic pressure 99.  2. Ventricle systolic pressure 970, end-diastolic pressure 33.   SELECTIVE CORONARY ANGIOGRAPHY:  1. Left main normal.  2. LAD; the LAD had a 40-50% segmental stenosis in the mid portion      after the second small diagonal branch.  3. Circumflex; dominant and free of significant disease.  4. Right coronary artery; nondominant and free of significant disease.  5. Left ventriculography; RAO left ventriculogram was performed using      25 mL of Visipaque dye at 12 mL per second.  The overall LVEF was      estimated greater than 60% without focal wall motion abnormalities.   IMPRESSION:  Ms. Teska has mild-to-moderate segmental mid left  anterior descending disease which appears to be fairly similar by  description to her anatomy 3 years ago.  At this point, we will continue  with medical therapy, potentially obtain an outpatient stress test on  her.  If she continues to have chest pain, she may need  intracoronary  vascular ultrasound-guided intervention.   An ACT was measured.  The sheath was removed.  Pressure was held in the  groin and achieved hemostasis.  The patient left the lab in stable  condition.      Quay Burow, M.D.  Electronically Signed     JB/MEDQ  D:  04/29/2009  T:  04/30/2009  Job:  [id number]   cc:   [facility]  [facility].  Edward L. [name], M.D.

## 2011-05-04 NOTE — Consult Note (Signed)
NAMEANNELYSE, REY              ACCOUNT NO.:  [id number]   MEDICAL RECORD NO.:  [id number]          PATIENT TYPE:  INP   LOCATION:  O712                          FACILITY:  [facility]   PHYSICIAN:  Quay Burow, M.D.   DATE OF BIRTH:  08/13/1966   DATE OF CONSULTATION:  DATE OF DISCHARGE:                                 CONSULTATION   [name] is a 45 year old morbidly overweight married [place]  American female, mother of 3 with history of noncritical CAD by cath in  2007 (40% lesion by her account performed by Dr. Alla German.  Her  cardiac risk factor profile is positive for insulin-dependent diabetes  or insulin pump as well as family history of hypertension.  The patient  does not smoke.  She does have a history of chronic lymphedema.  She was  admitted to [facility] yesterday after developing chest pain  that was waxing and waning with radiation down her left arm and  shortness of breath.  She was ruled out for myocardial infarction.  A D-  dimer and BNP were negative.  She had recurrent chest pain today because  she is on IV heparin.   MEDICATIONS:  1. Insulin pump.  2. Demadex 100 mg a day.  3. Metolazone 5 mg a day.   ALLERGIES:  IV CONTRAST and LYRICA.   REVIEW OF SYSTEMS:  A 12-point review of systems is negative except as  already noted.   PHYSICAL EXAMINATION:  VITAL SIGNS:  Blood pressure is 158/98 with pulse  of 84.  LUNGS:  Clear.  NECK:  Carotids are 2+ without bruits.  NEUROLOGIC:  The patient is alert and oriented.  Neurologically intact.  HEART:  Regular rate and rhythm without murmurs, gallops, rubs, or  clicks.  ABDOMEN:  Soft and nontender, but obese.  EXTREMITIES:  She has chronic lymphedema.   A 12-lead EKG reveals sinus rhythm with lateral T-wave changes.   Her chest x-ray shows no acute disease.   IMPRESSION:  [name] had symptoms compatible with unstable angina  with positive risk factors, 3 years post cath, which revealed  mild  disease.  Based on the findings and the risk factor profile, I feel  compelled to re-look her coronary arteries.  I am going to transfer her  [facility] today, and we will catheter tomorrow.  She will need contrast  allergy prophylaxis.      Quay Burow, M.D.  Electronically Signed     JB/MEDQ  D:  04/28/2009  T:  04/29/2009  Job:  [id number]   cc:   [place] [name], M.D.  Fax: [phone]

## 2011-05-04 NOTE — Op Note (Signed)
[name], [name]              ACCOUNT NO.:  [id number]   MEDICAL RECORD NO.:  [id number]          PATIENT TYPE:  INP   LOCATION:  3736                         FACILITY:  [facility]   PHYSICIAN:  Wonda Horner, M.D.   DATE OF BIRTH:  1966-09-17   DATE OF PROCEDURE:  05/01/2009  DATE OF DISCHARGE:  05/01/2009                               OPERATIVE REPORT   INDICATION FOR PROCEDURE:  Odynophagia.   The patient has a history of having been taking doxycycline recently.   PREMEDICATIONS:  1. Fentanyl 75 mcg IV.  2. Versed 7.5 mg IV.   PROCEDURE:  With the patient in the left lateral decubitus position, the  Pentax gastroscope was inserted into the oropharynx and passed into the  esophagus.  It was advanced down the esophagus, then into the stomach,  and into the duodenum.  The second portion and bulb of the duodenum  looked normal.  In the antrum of the stomach, there were two small  ulcers.  These were small and no evidence of active bleeding, a biopsy  for CLOtest was obtained.  The body of the stomach looked normal.  The  fundus and cardia looked normal.  The esophagus revealed several ulcers  in a focal area in the distal-to-mid esophagus consistent with pill-  induced esophageal ulceration, which would be compatible with her  history of being on doxycycline.  There was no evidence of active  bleeding.  The rest of the esophagus looked normal.  The esophagogastric  junction area looked normal.  She tolerated the procedure well without  complications.   IMPRESSION:  1. Pill-induced esophageal ulcerations in the distal-to-mid esophagus.  2. Two small ulcers in the antrum of the stomach.   RECOMMEND:  Treating with proton pump inhibitor and Carafate slurry  q.i.d.           ______________________________  Wonda Horner, M.D.     SFG/MEDQ  D:  05/01/2009  T:  05/02/2009  Job:  [id number]   cc:   Quay Burow, M.D.

## 2011-05-04 NOTE — Discharge Summary (Signed)
[name], [name]              ACCOUNT NO.:  [id number]   MEDICAL RECORD NO.:  [id number]          PATIENT TYPE:  INP   LOCATION:  N235                          FACILITY:  [facility]   PHYSICIAN:  Edward L. [name], M.D.DATE OF BIRTH:  03-09-1966   DATE OF ADMISSION:  04/27/2009  DATE OF DISCHARGE:  05/10/2010LH                               DISCHARGE SUMMARY   FINAL DISCHARGE DIAGNOSES:  1. Chest discomfort.  2. Diabetes mellitus.  3. Hypertension.  4. Chronic lymphedema of the legs.  5. Sleep apnea.   HISTORY OF PRESENT ILLNESS:  Ms. Walsh is a 45 year old who has severe  diabetes for which she is on an insulin pump.  She was on a usual state  of fair health at home, when she developed a substernal chest discomfort  that was described as aching or pressure.  She came to the emergency  room.  She has had a previous cardiac catheterization in 2007, which  showed nonobstructive coronary artery disease, but this was not totally  clean.   PHYSICAL EXAMINATION:  GENERAL:  A well-developed, moderately obese  female who is in no acute distress.  CHEST:  Clear.  HEART:  Regular.  ABDOMEN: Soft.  EXTREMITIES:  She has marked lymphedema of both legs.   HOSPITAL COURSE:  She was started on serial cardiac enzymes and EKGs.  Consultation was obtained with Dr. Quay Burow, cardiologist, with  [facility].  Dr. Gwenlyn Found felt that cardiac  catheterization was indicated and she was therefore transferred to [facility] for cardiac catheterization.      Edward L. [name], M.D.  Electronically Signed     ELH/MEDQ  D:  04/28/2009  T:  04/29/2009  Job:  [id number]

## 2011-05-04 NOTE — Consult Note (Signed)
[name], [name]              ACCOUNT NO.:  [id number]   MEDICAL RECORD NO.:  [id number]          PATIENT TYPE:  AMB   LOCATION:                                FACILITY:  [facility]   PHYSICIAN:  Hildred Laser, M.D.    DATE OF BIRTH:  [birth year]   DATE OF CONSULTATION:  DATE OF DISCHARGE:                                 CONSULTATION   REQUESTING PHYSICIAN:  Dr. [name].   FAMILY PHYSICIAN:  Dr. Laural Golden.   REASON FOR CONSULTATION:  Chronic diarrhea.   HISTORY OF PRESENT ILLNESS:  [name] is a 45 year old female who  tells me she has had chronic diarrhea which is usually intermittent.  It  has been worse over the last year.  She has episodes approximately four  days a week.  She tells me if she eats after 7 o'clock in the evening or  before 10:00 a.m., she will have anywhere between 15 or more loose  watery bowel movements a day.  She denies any rectal bleeding or melena.  Denies any mucus in her stools.  She is diabetic with hemoglobin A1c in  the 13-range.  Blood sugars have been ranging 300 at home.  She does  have low abdominal cramping which is usually resolved post defecation.  Her symptoms have been worsening over the last year.  She tells me over  the last six weeks, however, she has had pretty much had daily diarrhea.  She also has nausea just before the loose stools.  This does resolve  when she goes to the bathroom.  She denies any vomiting.  She complains  of increased gas and borborygmus but rarely has heartburn or  indigestion.  Denies any anorexia or early satiety.   PAST MEDICAL HISTORY:  1. Diabetes mellitus on insulin.  2. Poorly controlled asthma.  3. Hypertension.  4. Left lower extremity lymphedema.  5. Hypercholesterolemia.   PAST SURGICAL HISTORY:  1. She had two cesarean sections.  2. Partial thyroidectomy.  3. Partial hysterectomy.  4. She had a Chiari malformation reduction.   CURRENT MEDICATIONS:  1. Lovenox 0.025/2.5 mg p.r.n.  2.  Hydrocodone 5/500 mg daily as needed.  3. Humulin insulin 70/30, 70 units b.i.d.  4. Klor-Con 20 mEq once daily.  5. Tekturna 300/25 mg daily.  6. Torsemide 50 mg daily.   ALLERGIES:  1. INTRAVENOUS DYE.  2. PNEUMOVAX.   FAMILY HISTORY:  Positive for multiple family members with colon cancer.  She had a sister diagnosed at age 43, a paternal grandmother diagnosed  in her 39s and a few aunts and cousins as well.  Her mother had a  massive MI and CVA at age 61 and is deceased.  Her father, age 53, had a  CVA last year.  She has multiple siblings.   SOCIAL HISTORY:  [name] is married.  She has three healthy  children.  She is a day Actuary.  She denies any tobacco or drug  use.  She occasionally has a glass of wine.   REVIEW OF SYSTEMS:  CONSTITUTIONAL:  Weight is down 9 pounds in the  last  six weeks.  Denies any fever or chills.  CARDIOVASCULAR:  Denies any  chest pain or palpitations.  PULMONARY:  She did has had a nonproductive  cough and has had some shortness of breath on exertion.  Denies any  hemoptysis.  GI:  See HPI.  GU:  Denies any dysuria, hematuria,  increased urinary frequency.   PHYSICAL EXAMINATION:  VITAL SIGNS:  Weight 337 pounds, height 65  inches, temperature 98.7.  Blood pressure 170/100, pulse 92.  GENERAL:  [name] is an morbidly obese African-American female who  is alert, oriented, pleasant, cooperative and in no acute distress.  HEENT.  __________.  Nonicteric.  Conjunctivae pink.  Oropharynx pink  and moist without any lesions.  NECK:  Supple without thyromegaly.  CHEST:  Heart regular rate and rhythm.  Normal S1 and S2.  No murmurs,  clicks, rubs or gallops.  LUNGS:  Clear to auscultation bilaterally.  ABDOMEN:  Protuberant with positive bowel sounds x4.  No bruits  auscultated.  Soft, nondistended.  She does have mild tenderness to the  left lower quadrant on deep palpation.  No rebound tenderness or  guarding.  No hepatosplenomegaly or  mass.  Exam is limited given the  patient's body habitus.  EXTREMITIES:  She has massive lymphedema to the left lower extremity.  No clubbing noted.  SKIN:  Brown, warm, and dry without rash or jaundice.   IMPRESSION:  [name] is a 45 year old female with chronic diarrhea.  Her symptoms have been intermittent; however, over the last six weeks,  she has had almost daily symptoms with up to 15 nonbloody loose, watery  bowel movements a day.  There is a significant family history for colon  cancer.  She is going to need further evaluation to rule out  inflammatory bowel disease and colon cancer.  She has poorly controlled  diabetes mellitus and another possibility is diabetic diarrhea.   PLAN:  1. I would like her to talk with Dr. [name] about adjusting her      insulin for better glycemic control.  2. Check CBC, sedimentation rate, MET7, and TSH.  3. Colonoscopy with Dr. Laural Golden in the near future.  Discussed the      procedure including risks and benefits to include but not limited      to bleeding, infection, perforation, and drug reaction.  She agrees      with the plan and      consent will be obtained.  She is to take 25 units of Lantus      insulin b.i.d. the day prior to and the morning of her procedure      which is approximately one-third of her does to prevent      hypoglycemia.   We would like to thank Dr. [name] for allowing [place] to participate in the  care of [name].      Les Pou, N.P.      Hildred Laser, M.D.  Electronically Signed    KC/MEDQ  D:  05/17/2007  T:  05/17/2007  Job:  [id number]   cc:   Percell Miller L. [name], M.D.  Fax: [phone]

## 2011-05-07 NOTE — Op Note (Signed)
NAME:  [name], GIESE                        ACCOUNT NO.:  [id number]   MEDICAL RECORD NO.:  [id number]                   PATIENT TYPE:  INP   LOCATION:  3312                                 FACILITY:  [facility]   PHYSICIAN:  Ileene Hutchinson T. Erik Obey, M.D.              DATE OF BIRTH:  06/28/1966   DATE OF PROCEDURE:  05/02/2003  DATE OF DISCHARGE:                                 OPERATIVE REPORT   PREOPERATIVE DIAGNOSES:  1. Recurrent streptococcal tonsillitis.  2. Obstructive adenotonsillar hypertrophy including sleep apnea.  3. Hypertrophic inferior turbinates with obstruction.   POSTOPERATIVE DIAGNOSES:  1. Recurrent streptococcal tonsillitis.  2. Obstructive adenotonsillar hypertrophy including sleep apnea.  3. Hypertrophic inferior turbinates with obstruction.   PROCEDURE:  Tonsillectomy, adenoidectomy, bilateral submucous resection  inferior turbinates.   SURGEON:  Marikay Alar. Erik Obey, M.D.   ANESTHESIA:  General orotracheal.   ESTIMATED BLOOD LOSS:  Minimal.   COMPLICATIONS:  None.   FINDINGS:  Obese, adult black female with an overall narrow nose and a  slight leftward deviation.  Very large bony inferior turbinates with a  partial obstruction of the nose.  Normal soft palate.  Tonsils are 3+ with a  _________ pharynx, 75% obstructive adenoids up the choanal level.   DESCRIPTION OF PROCEDURE:  With the patient in the comfortable supine  position, general orotracheal anesthesia was induced without  difficulty.  At an appropriate level the patient was placed in a slight sitting position.  A saline-moistened throat pack was placed.  Nasal vibrissa were trimmed.  Xylocaine 1% with 1:100,000 epinephrine 6 mL total was infiltrated into the  inferior turbinates on both sides for intraoperative hemostasis.  Xylocaine  with phenylephrine liquid was applied on cotton pledgets to the nasal septum  and inferior turbinates on both sides for additional hemostasis.  Additional  1%  Xylocaine with 1:100,000 epinephrine, 8 mL, was infiltrated into the  peritonsillar plane on each side using a Mossberg tongue blade for access.  Several minutes were allowed for hemostasis to take effect.  A sterile  preparation and draping of the mid phase was accomplished.   The materials were removed from the nose and observed to be intact and  correct in number.  The findings were as described above.  Beginning on the  right side, the anterior hood of the inferior turbinate was lysed just  behind the nasal valve.  The medial mucosa of the turbinate was incised in  an anterior upsloping fashion and a laterally based flap was developed.  The  turbinate was then infractured.  Using angled turbinate scissors, the  turbinate bone in the lateral mucosa was submucosally resected in a down  sloping fashion posteriorly taking virtually all of the anterior pole and  leaving and leaving virtually all of the posterior pole behind.  The bony  spicules were carefully removed.  The cut edges were cauterized.  The  turbinate was outfractured after laying  the flap back down and this side was  completed.  The left side was done in an identical fashion.  After  completing both turbinate reductions, a triple thickness Neosporin  impregnanted Telfa pack was placed along the turbinate on both sides.  A  shortened 6.5 mm nasal trumpet was placed on each side to allow some nasal  airway in the immediate postoperative period.  This completed the nasal  procedure.   The table was turned 90 degrees, and the patient placed in Trendelenburg.  The pharynx was suctioned free of a small amount of blood, and a throat pack  was removed.  The findings were as described above.  Taking care to protect lips, teeth, and endotracheal tube, the Crowe-Davis  mouth gag was introduced, expanded for visualization, and suspended from the  [place] stand in the standard fashion.  The palate retractor was used to  visualize the  nasopharynx with the findings as described above.  The tonsils  were very large and it was difficult to see the nasopharynx clearly.   Beginning on the left side, the tonsil was grasped and retracted medially.  The mucosa overlying the anterior and superior poles was coagulated and cut  then down to the capsule of the tonsil.  Using the cautery tip as a blunt  dissector, coagulating crossing vessels as identified and lysing fibrous  bands, the tonsil was dissected free of its muscular fossa from superiorly  downward.  The tonsil was removed in its entirety as determined by  examination of the tonsil and fossa.  A small additional quantity of cautery  rendered the fossa hemostatic.  After completing the left tonsillectomy, the  right side was done in an identical fashion.   After completing both tonsillectomies, a 14 French red rubber catheter was  passed through the nose and out the mouth and served as a Freight forwarder.  This allowed visualization up into the nasopharynx with the findings as  described above.  The adenoids were sufficiently bulky that it was felt  appropriate to simply coagulate them.  Therefore, sharp adenoid curets were  inserted and the adenoids were sucked free from the nasopharynx.  The  nasopharynx was high and forward, and it was difficult to reach the choana  level with the curets.  The adenoid tissue was carefully removed from the  field and passed off as specimen.  The pharynx was packed with saline  moistened tonsillar sponges for hemostasis.  These were allowed to remain in  place for several minutes.  Final hemostasis was observed, and at this point  the procedure was felt to have been completed.  The palate retractor and  mouth gag were relaxed for several minutes.  Upon re-expansion, hemostasis  was persistent.  At this point, the procedure was completed.  The palate retractor and mouth gag were relaxed and removed.  The patient was then  returned to  anesthesia, awakened, extubated and transferred to recovery in  stable condition.   COMMENT:  50 black female with a history of streptococcal  tonsillitis but also a history of large tonsils, adenoids, snoring, mouth  breathing, apnea and poor sleep and very large turbinates were the  indications for the several portions today's procedure.  Given her sleep  apnea, she will be transferred to [facility] in the postoperative period  for overnight observation.  Otherwise, I anticipate a routine postoperative  recovery with attention to analgesia, antibiosis, hydration.  We will watch  her oxygen saturation monitoring and her  pain medicine dosing.  She has  diabetes, and we will watch her sugars.  Anticipate discharge in 24 hours  after pack removal.                                               Ileene Hutchinson T. Erik Obey, M.D.   KTW/MEDQ  D:  05/02/2003  T:  05/03/2003  Job:  [id number]   cc:   Percell Miller L. [name], M.D.  [place]  [place] [address]  Fax: [phone]

## 2011-05-07 NOTE — Discharge Summary (Signed)
[name], [name]                        ACCOUNT NO.:  [id number]   MEDICAL RECORD NO.:  [id number]                   PATIENT TYPE:  INP   LOCATION:  A323                                 FACILITY:  [facility]   PHYSICIAN:  Edward L. [name], M.D.             DATE OF BIRTH:  [birth year]   DATE OF ADMISSION:  12/21/2002  DATE OF DISCHARGE:  12/27/2002                                 DISCHARGE SUMMARY   FINAL DISCHARGE DIAGNOSES:  1. Diverticulitis.  2. Hypertension.  3. Diabetes mellitus.  4. Massive obesity.   HISTORY:  The patient is a 45 year old who was in her usual state of fairly  poor health and whom came to the emergency room on the day of admission with  a history of left lower quadrant pain for about two days, nausea, and  vomiting.  Her evaluation in the emergency room included a CT scan of the  abdomen and she was found to have what appeared to be diverticulitis.  She  was admitted to the hospital with a diagnosis of diverticulitis.  She also  has a history of diabetes mellitus, which has been fairly well controlled  and has been relatively mild and not having to be treated to any great  extent.  She has not had any previous abdominal problems.  She also has a  history of chronic lymphedema.   MEDICATIONS:  Lotrel 5/10.   PHYSICAL EXAMINATION:  ABDOMEN:  She was quite tender in the low abdomen.  She did not have rebound tenderness.  EXTREMITIES:  She did have marked lymphedema of the lower extremities, which  is chronic.   LABORATORY DATA:  Her lab work shows that her white blood count was slightly  elevated.   Abdominal films, other than the CT, were normal.   HOSPITAL COURSE:  She was treated with intravenous antibiotics, IV fluids.  Her blood sugar was monitored and she eventually was started on Glucotrol 5  mg daily.  Her blood pressure was treated with her Lotrel 5/10 one daily.  Her abdominal pain continued to be moderately severe but eventually it got  to  the point that she was able to eat, she was able to ambulate some, and  was improved enough for discharge on December 27, 2002.   DISCHARGE MEDICATIONS:  1. Glucovance 5/500 one daily.  2. Lotrel 5/10 one daily.  3. Flagyl 500 mg t.i.d.  4. Cipro 500 mg b.i.d.    ADDENDUM:  Her hemoglobin A1c level to evaluate her long-term blood sugar  control was 12.7 suggesting that she has had poor control for some time.                                               Edward L. [name], M.D.    ELH/MEDQ  D:  12/27/2002  T:  12/28/2002  Job:  [id number]

## 2011-05-07 NOTE — Group Therapy Note (Signed)
[name], [name]                        ACCOUNT NO.:  [id number]   MEDICAL RECORD NO.:  [id number]                   PATIENT TYPE:  INP   LOCATION:  A323                                 FACILITY:  [facility]   PHYSICIAN:  Edward L. [name], M.D.             DATE OF BIRTH:  1966-11-29   DATE OF PROCEDURE:  DATE OF DISCHARGE:                                   PROGRESS NOTE   PROGRESS NOTE:  Ms. Kathlen Mody is in with diverticulitis, diabetes,  hypertension.  She now says that she is very achy all over.  Her stomach is  a little bit better but she remains with diffuse myalgias.   PHYSICAL EXAMINATION:  The exam of her abdomen shows that it is softer, but  she is still tender in the left lower quadrant.  CHEST:  Clear.  HEART:  Regular.  VITAL SIGNS:  Temperature is 97.5, pulse 84, respirations 20, blood pressure  176/87.  She started on her antihypertensive yesterday. Blood sugars are  ranging in the 290 range.  Weight 287.  Her I&O is +709.  \   PLAN:  My plan is to add Glucotrol XL 5 mg, have diabetic teaching done, and  put on 1800 calorie ADA diet, and because there has been a flu epidemic and  she does have flu-like symptoms, I am going to go ahead and treat her with  Flumadine.  No other new treatments right now.                                                Edward L. [name], M.D.    ELH/MEDQ  D:  12/25/2002  T:  12/25/2002  Job:  [id number]

## 2011-05-07 NOTE — Discharge Summary (Signed)
[name], [name]              ACCOUNT NO.:  [id number]   MEDICAL RECORD NO.:  [id number]          PATIENT TYPE:  INP   LOCATION:  3740                         FACILITY:  [facility]   PHYSICIAN:  Bryson Dames, M.D.DATE OF BIRTH:  May 02, 1966   DATE OF ADMISSION:  05/20/2006  DATE OF DISCHARGE:  05/26/2006                                 DISCHARGE SUMMARY   DISCHARGE DIAGNOSES:  1.  Chest pain and mild coronary disease at catheterization with good left      ventricular function.  2.  Insulin-dependent diabetes.  3.  Hypertension.  4.  Obesity and suspected sleep apnea.  5.  History of chronic lymphedema of the lower extremities.  6.  Pneumovax reaction this admission.   HOSPITAL COURSE:  The patient is a 45 year old female who is an insulin-  dependent diabetic.  She has chronic lower extremity edema from lymphedema.  She was at work when she developed substernal chest pain described as  pressure.  Her symptoms were worrisome for unstable angina.  She was  admitted as a transfer from [facility].  She was started on IV  nitrates and Lovenox, beta blocker and aspirin.  Her blood pressure was  elevated and we added an ACE inhibitor.  During routine screening, it was  found that she not had Pneumovax and this was automatically given.  The  patient developed fever and a local reaction to the Pneumovax.  She was seen  and consul by the ID service and followed.  Ultimately, she was catheterized  on May 24, 2006, by Dr. Tami Ribas.  This revealed normal RCA, normal left  main, normal dominant circumflex and normal LAD.  She had 40% distal LAD  narrowing and an EF of 60%.  Plan is for medical therapy.  She is discharged  May 26, 2006.   DISCHARGE MEDICATIONS:  1.  Prilosec OTC once a day.  2.  Coated aspirin daily.  3.  HCTZ 100 mg a day.  4.  Actos 30 mg a day.  5.  Tri-Chlor 145 mg a day.  6.  Lisinopril 10 mg a day.  7.  Coreg 10 mg a day SR.  8.  Norvasc 5 mg a day.  9.   Humulin NPH 60 units twice a day.   LABORATORY DATA AND X-RAY FINDINGS:  White count 11.5, hemoglobin 12.8,  hematocrit 38.5, platelets 333.  Sodium 137, potassium 4.3, BUN 14,  creatinine 0.8.  Troponins are negative.  BNP is less than 30.  Hemoglobin  A1c is 12.  TSH is 6.2.  D-dimer is 0.25.   CONDITION ON DISCHARGE:  The patient is discharged stable condition.   FOLLOW UP:  She will follow up with Dr. [name].  She will need a followup  TSH as an outpatient and follow-up of her diabetes as an outpatient.      Erlene Quan, P.A.    ______________________________  Bryson Dames, M.D.    Meryl Dare  D:  05/26/2006  T:  05/27/2006  Job:  [id number]   cc:   Percell Miller L. [name], M.D.  Fax: [phone]

## 2011-05-07 NOTE — Group Therapy Note (Signed)
   NAME:  [name], [name]                        ACCOUNT NO.:  [id number]   MEDICAL RECORD NO.:  [id number]                   PATIENT TYPE:  INP   LOCATION:  A323                                 FACILITY:  [facility]   PHYSICIAN:  Angus G. Everette Rank, M.D.              DATE OF BIRTH:  01/22/1966   DATE OF PROCEDURE:  12/22/2002  DATE OF DISCHARGE:                                   PROGRESS NOTE   SUBJECTIVE:  This patient was admitted with acute flare-up of  diverticulitis.  She continues to have intermittent sharp pain in the right  lower quadrant.   OBJECTIVE:  Vital signs:  Blood pressure 145/75, respirations 20, pulse 80,  temperature 97.7.  Heart:  Normal sinus rhythm with episodes of tachycardia.  Lungs:  Clear to P&A.  Abdomen:  The patient has tenderness over the left lower quadrant of the  abdomen.   ASSESSMENT:  The patient was admitted with diverticulitis.   PLAN:  To continue IV antibiotics, continue to follow the patient.                                                Angus G. Everette Rank, M.D.    AGM/MEDQ  D:  12/22/2002  T:  12/22/2002  Job:  [id number]

## 2011-05-07 NOTE — H&P (Signed)
   NAME:  [name], [name]                        ACCOUNT NO.:  [id number]   MEDICAL RECORD NO.:  [id number]                   PATIENT TYPE:  INP   LOCATION:  A323                                 FACILITY:  [facility]   PHYSICIAN:  Angus G. Everette Rank, M.D.              DATE OF BIRTH:  06-13-1966   DATE OF ADMISSION:  12/21/2002  DATE OF DISCHARGE:                                HISTORY & PHYSICAL   HISTORY OF PRESENT ILLNESS:  The patient is a 45 year old white female  admitted through the ED with a history of left lower quadrant pain of 2  days' duration, of moderate severity, some nausea and vomiting.  She was  evaluated by ED physician.  Abdominal CT scan showed evidence of  diverticulitis without abscess.  The patient apparently has chronic  lymphedema of undetermined etiology.  The patient was given a dose of Unasyn  in ED and subsequently was admitted.   SOCIAL HISTORY:  The patient does not drink or smoke.   FAMILY HISTORY:  Family history essentially uneventful.   PAST MEDICAL HISTORY:  The patient has history of hypertension in the past,  history of chronic lymphedema, history of previous diverticulitis.   PAST SURGICAL HISTORY:  The patient has history of hysterectomy and thyroid  surgery in the past.   ALLERGIES:  Has no known allergies.   REVIEW OF SYSTEMS:  CARDIOPULMONARY: No cough, hemoptysis, dyspnea.  GI:  Abdominal pain left lower quadrant, bouts of vomiting.   EXAMINATION:  GENERAL:  Alert white female.  VITAL SIGNS:  Blood pressure 137/87, pulse 84, temperature 98.  HEENT:  Eyes PERRLA.  TMs negative.  Oropharynx benign.  NECK:  Neck supple.  No JVD or thyroid abnormalities.  LUNGS:  Clear to P&A.  HEART:  Regular rhythm.  ABDOMEN:  No palpable organs or masses.  Tenderness in the left lower  quadrant.  Mild rebound tenderness.  SKIN:  Warm and dry.  EXTREMITIES:  Free of edema.  NEUROLOGICAL:  No focal deficit.   DIAGNOSES:  1. Diverticulitis.  2. Chronic  lymphedema.                                              Angus G. Everette Rank, M.D.     AGM/MEDQ  D:  12/21/2002  T:  12/22/2002  Job:  [id number]

## 2011-05-07 NOTE — Discharge Summary (Signed)
NAME:  [name], [name]                        ACCOUNT NO.:  [id number]   MEDICAL RECORD NO.:  [id number]                   PATIENT TYPE:  INP   LOCATION:  6962                                 FACILITY:  [facility]   PHYSICIAN:  Ileene Hutchinson T. Erik Obey, M.D.              DATE OF BIRTH:  07/25/1966   DATE OF ADMISSION:  05/02/2003  DATE OF DISCHARGE:  05/05/2003                                 DISCHARGE SUMMARY   CHIEF COMPLAINT:  Recurrent tonsillitis, sleep apnea.   HISTORY OF PRESENT ILLNESS:  A 45 year old black female with a one year  history of hypertrophic tonsils and adenoids. She has had strep tonsillitis  four times. She does have reflux. She does have witnessed snoring including  apnea.   PAST MEDICAL HISTORY:  No known allergies. She has diabetes, hypertension,  sleep apnea, and left leg lymphedema.   She takes Lotrel, Mobic, Glucovance, and torsemide.   She has had Cesarean section x2, hysterectomy, thyroidectomy, and Arnold-  Chiari decompression.   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 She does  not smoke nor drink.   FAMILY HISTORY:  Negative for bleeding or anesthesia reactions.   REVIEW OF SYSTEMS:  No suggestion of hypothyroidism.   PHYSICAL EXAMINATION:  GENERAL:  This is an obese, hyponasal, adult black  female in no distress. Mental status is appropriate.  HEENT:  She hears well in conversational speech. Voice is clear and  respirations unlabored through the mouth. The head is atraumatic and neck  supple. Cranial nerves intact. Ears are clear __________. Anterior nose is  congested. Oral cavity reveals 3-4+ tonsils with a normal soft palate.  NECK:  Without adenopathy.  LUNGS:  Lungs sounds are distant but unlabored.  BREASTS:  Did not exam breasts.  HEART:  With regular rate and rhythm without murmurs.  ABDOMEN:  Obese and active. Did not exam GI tract.  EXTREMITIES:  Reveals severe lymphedema of the left leg.   ADMISSION  DIAGNOSES:  1. Obstructive adenoid and tonsil hypertrophy including sleep apnea.  2. Hypertrophic inferior turbinates with obstruction.   HOSPITAL COURSE:  On the day of her admission, she was taken to the  operating room under general anesthesia. She underwent a tonsillectomy,  adenoidectomy, and SMR of inferior turbinates bilateral. Following surgery,  she was transferred to the 3300 step-down unit where she was stable but had  some oxygen desaturation requiring oxygen support, large amount of pain, and  poor p.o. intake. The first postoperative day, she was stable with blood  sugars running in the low 200s down into the mid 100s range. She was taking  p.o. poorly and requiring intravenous morphine for pain control. The nasal  packing and trumpets were removed on this day. On the second postoperative  day, she was in a similar status with large pain, poor p.o. intake, and  weaning oxygen. By the third postoperative day, she  was taking decent  fluids, having adequate pain relief on oral agents, and was discharged to  her home in the care of her family. She will use oral and nasal hygiene  instructions. She will advice diet as comfortable. She has prescriptions for  pain medicine and analgesia. She was given instructions regarding diet and  activity.   DIAGNOSES:  1. Obstructive adenoid and tonsillar hypertrophy with sleep apnea.  2. Hypertrophic inferior turbinates with obstruction.   PROCEDURE PERFORMED:  Tonsillectomy, adenoidectomy, bilateral SMR inferior  turbinates.   COMPLICATIONS:  None.   CONDITION ON DISCHARGE:  Ambulatory, pain controlled, and taking decent p.o.   RETURN VISIT:  One week scheduled.   PRESCRIPTIONS:  Roxicet liquid, Tylenol with codeine liquid, amoxicillin  liquid.   INSTRUCTIONS:  Written and given.                                               Marikay Alar. Erik Obey, M.D.    KTW/MEDQ  D:  05/22/2003  T:  05/23/2003  Job:  [id number]   cc:   Percell Miller L.  [name], M.D.  [place]  [place] [address]  Fax: [phone]

## 2011-05-07 NOTE — Cardiovascular Report (Signed)
NAME:  [name], [name]                  ACCOUNT NO.:  0   MEDICAL RECORD NO.:  [id number]           PATIENT TYPE:   LOCATION:                                 FACILITY:   PHYSICIAN:  Octavia Heir, MD  DATE OF BIRTH:  1966-08-30   DATE OF PROCEDURE:  05/24/2006  DATE OF DISCHARGE:                              CARDIAC CATHETERIZATION   PROCEDURES:  1.  Left heart catheterization.  2.  Coronary angiography.  3.  Left ventriculogram.  4.  Abdominal aortogram.   COMPLICATIONS:  None.   ATTENDING:  Octavia Heir, M.D.   INDICATIONS:  [name] is a 45 year old female with a history of  diabetes, hypertension, obesity who was admitted on May 20, 2006, with  complaint of substernal chest pain.  She was noted to be hypertensive on  admission with a blood pressure of 150/100.  She did subsequently rule out  for myocardial infarction, although she has had ongoing chest pain.  She is  now brought in for cardiac catheterization to rule out significant CAD.   DESCRIPTION OF PROCEDURE:  After gaining informed consent, the patient was  brought to the cardiac catheterization lab, prepped and draped in the usual  sterile fashion.  ECG monitor was established.  Utilizing the modified  Seldinger technique a 6-French arterial sheath was inserted in right femoral  artery.  A 6-French diagnostic catheter performed diagnostic angiography.   The left main is a large vessel with no severe disease.   The LAD is a large vessel that courses the apex with three diagonal  branches.  The LAD has had some mild mid LAD bridging up to 40% for the take  up of the third diagonal.  There is a TIMI-3 flow to the distal vessel.   First, second and third diagonals were all small vessels with no severe  disease.   Left circumflex was a large vessel which was dominant and gives rise to two  obtuse marginal branches as well as a PDA.   The first and second limbs of the large vessels bifurcate distally  with no  severe disease.   PDA is a medium size vessel which rises from the circumflex with no severe  disease.   The right coronary artery is a small, nondominant vessel with no severe  disease.   The left ventriculogram reveals a preserved EF at 60%.   Abdominal aortogram reveals no significant stenosis.   HEMODYNAMIC DATA:  Pressure 128/80; LV pressure 130/20, LVEDP 32.   CONCLUSION:  1.  Noncritical coronary artery disease.  2.  Normal left ventricular systolic function.  3.  No evidence of renal artery stenosis.  4.  Systemic hypertension.  5.  Elevated left ventricular end-diastolic pressure.      Octavia Heir, MD  Electronically Signed     RHM/MEDQ  D:  05/24/2006  T:  05/24/2006  Job:  [id number]   cc:   Percell Miller L. [name], M.D.  Fax: [phone]

## 2011-05-07 NOTE — Procedures (Signed)
[name], [name]              ACCOUNT NO.:  [id number]   MEDICAL RECORD NO.:  [id number]          PATIENT TYPE:  OUT   LOCATION:  RAD                           FACILITY:  [facility]   PHYSICIAN:  Jacqulyn Ducking, M.D. [facility] OF BIRTH:  02-21-1966   DATE OF PROCEDURE:  DATE OF DISCHARGE:                                  ECHOCARDIOGRAM   REFERRING PHYSICIAN:  Dr. [name].   CLINICAL DATA:  This 45 year old woman with chest pain an dyspnea.  M-Mode.  Aorta:  3.0, left atrium 4.3, septum 1.6, posterior wall 1.3, LV diastole  4.0, LV systole 2.8.   1.  Technically suboptimal but adequate echocardiographic study.  2.  Normal left and right atrial size; mild right ventricular enlargement.      Normal right ventricular wall thickness and function.  3.  Minimal aortic valvular sclerosis.  Very mild calcification of the      proximal ascending aorta.  4.  Normal tricuspid valve.  Physiologic regurgitation.  5.  Normal mitral valve.  Minimal annular calcification.  6.  Normal proximal pulmonary artery and pulmonic valve.  7.  Normal left ventricular size; mild hypertrophy, mild septal flattening.      Normal regional and global systolic function.  8.  Minimal posterior pericardial effusion.   IMPRESSION:  Slight right ventricular enlargement and septal flattening  raise the question of elevated right-sided pressure.  A definitive judgment  on this issue could not be established based upon this examination.  Other  findings as noted.      Jacqulyn Ducking, M.D. [facility]  Electronically Signed     RR/MEDQ  D:  07/26/2005  T:  07/27/2005  Job:  [id number]

## 2011-05-07 NOTE — Group Therapy Note (Signed)
   NAME:  [name], [name]                        ACCOUNT NO.:  [id number]   MEDICAL RECORD NO.:  [id number]                   PATIENT TYPE:  INP   LOCATION:  A323                                 FACILITY:  [facility]   PHYSICIAN:  Angus G. Everette Rank, M.D.              DATE OF BIRTH:  Dec 16, 1966   DATE OF PROCEDURE:  12/23/2002  DATE OF DISCHARGE:                                   PROGRESS NOTE   SUBJECTIVE:  The patient was admitted with left lower quadrant  pain/diverticulitis.  She does have lymphedema of her leg.  She remains  fairly comfortable following pain meds.   OBJECTIVE:  VITAL SIGNS:  Blood pressure 146/83, respirations 20, pulse 88,  temperature 98.2.  LUNGS:  Clear to P&A.  HEART:  Regular rhythm.  ABDOMEN:  No palpable organs or masses but tenderness in left lower  quadrant.   ASSESSMENT:  The patient was admitted to the hospital with diverticulitis.   PLAN:  Plan to continue current IV and oral antibiotic.  We will repeat CBC  today.                                               Angus G. Everette Rank, M.D.    AGM/MEDQ  D:  12/23/2002  T:  12/24/2002  Job:  [id number]

## 2011-05-07 NOTE — Group Therapy Note (Signed)
   NAMEALICIANA, RICCIARDI                        ACCOUNT NO.:  [id number]   MEDICAL RECORD NO.:  [id number]                   PATIENT TYPE:  INP   LOCATION:  A323                                 FACILITY:  [facility]   PHYSICIAN:  Edward L. [name], M.D.             DATE OF BIRTH:  October 16, 1966   DATE OF PROCEDURE:  DATE OF DISCHARGE:                                   PROGRESS NOTE   PROBLEM:  Diverticulitis.   The patient says she is a little better, but she still is still pretty  tender in her abdomen.  She has no other new complaints.  She is on a soft  diet and has tolerated that thus far.   Her exam shows that her temperature is 98.2, pulse 76, respirations 18,  blood pressure 142/87, weight 287.  Her abdominal  pain is rated at a 4, but  seems to be improving.  Her I&O is +638.  White blood count yesterday was  8000.   ASSESSMENT:  She is better.  Her blood sugar has been up on admission.  She  is not getting any Accu-Chek.  She is going to need to have that done.  She  does have what appears to be hyperglycemia and I believe she has actual  adult-onset diabetes mellitus.  We will check a hemoglobin A1c as well as  checking blood sugars by Accu-Chek.                                               Edward L. [name], M.D.    ELH/MEDQ  D:  12/24/2002  T:  12/24/2002  Job:  [id number]

## 2011-05-07 NOTE — Group Therapy Note (Signed)
   [name], [name]                        ACCOUNT NO.:  [id number]   MEDICAL RECORD NO.:  [id number]                   PATIENT TYPE:  INP   LOCATION:  A323                                 FACILITY:  [facility]   PHYSICIAN:  Edward L. [name], M.D.             DATE OF BIRTH:  [birth year]   DATE OF PROCEDURE:  12/27/2002  DATE OF DISCHARGE:  12/27/2002                                   PROGRESS NOTE   PROBLEM:  Diverticulitis, diabetes, hypertension.   SUBJECTIVE:  This patient says that she is feeling a lot better and wants to  go home today. She has no new complaints.  She denies any nausea or  vomiting.  Her abdominal pain is much improved.   OBJECTIVE:  Her exam, today, shows that her chest is clear.  Her abdomen is  much less tender.  Blood pressure 130/80.  She did get to 140/100 last  night.  Her abdomen is soft.   ASSESSMENT:  She is much improved.   PLAN:  The plan is to continue treatments, but for discharge home today.  She will be on oral medications.  Please see discharge summary for details.                                               Edward L. [name], M.D.    ELH/MEDQ  D:  12/27/2002  T:  12/28/2002  Job:  [id number]

## 2011-05-07 NOTE — Group Therapy Note (Signed)
   [name], [name]                        ACCOUNT NO.:  [id number]   MEDICAL RECORD NO.:  [id number]                   PATIENT TYPE:  INP   LOCATION:  A323                                 FACILITY:  [facility]   PHYSICIAN:  Edward L. [name], M.D.             DATE OF BIRTH:  [birth year]   DATE OF PROCEDURE:  12/26/2002  DATE OF DISCHARGE:                                   PROGRESS NOTE   PROBLEMS:  1. Diabetes.  2. Hypertension.  3. Diverticulitis.  4. Massive obesity.   SUBJECTIVE:  The patient says she is feeling a lot better today.  She has no  new complaints.  Her achiness, which was thought to be a flu-like syndrome,  has resolved.  Her blood sugars are better but not totally controlled.  She  has only been on the Glucotrol now for about two days.   PHYSICAL EXAMINATION:  VITAL SIGNS:  Her exam today shows her temperature is  98.8, pulse 86, respirations 20, blood pressure 143/94 which is much  improved.  Her blood sugar 212 this morning which is also much improved.  ABDOMEN:  Much less tender.  CHEST:  Clear.   ASSESSMENT:  She does look a great deal better.   PLAN:  My plan is to continue with her intravenous antibiotics, etc., to  make sure that this is a lasting improvement and probably home in the  morning.                                               Edward L. [name], M.D.    ELH/MEDQ  D:  12/26/2002  T:  12/26/2002  Job:  [id number]

## 2011-05-07 NOTE — H&P (Signed)
NAMETRIXY, LOYOLA              ACCOUNT NO.:  [id number]   MEDICAL RECORD NO.:  [id number]          PATIENT TYPE:  INP   LOCATION:  2904                         FACILITY:  [facility]   PHYSICIAN:  Bryson Dames, M.D.DATE OF BIRTH:  June 07, 1966   DATE OF ADMISSION:  05/20/2006  DATE OF DISCHARGE:                                HISTORY & PHYSICAL   CHIEF COMPLAINT:  Chest pain.   HISTORY OF PRESENT ILLNESS:  Ms. Howerton is a pleasant 45 year old female  from [place] who has been followed by Dr. [name].  She has a history of  insulin-dependent diabetes for 3 years.  She has chronic lymphedema.  She  was at work today when she developed a pressure in her chest which radiated  to her arm.  This was associated with some nausea and shortness of breath  and mild diaphoresis.  She was brought to [facility] ER.  Nitroglycerin  helps but her symptoms have recurred.  Her enzymes so far are negative.  She  is transferred to [facility] via [facility] for further evaluation.   PAST MEDICAL HISTORY:  Her past medical history is remarkable for insulin-  dependent diabetes.  She has hypertension which Dr. [name] is currently  following.  She has chronic lymphedema.  She has had a partial thyroidectomy  in the past for thyroid cancer.  She has had previous C-section x2 and has  had a hysterectomy in the past.   CURRENT MEDICATIONS:  Her current medications are Lantus 50 units b.i.d.,  Actos 30 mg a day, Vicodin p.r.n. for chronic leg pain and  hydrochlorothiazide 100 mg a day.   ALLERGIES:  SHE HAS NO KNOWN DRUG ALLERGIES.  SHE DOES GET A RASH FROM  STRAWBERRIES.   SOCIAL HISTORY:  She is married.  She has three children, one grandchild.  She never smoked.  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   FAMILY HISTORY:  Her family history is remarkable for coronary disease, her  mother had an MI in her 66s.  She has a sister who had heart failure.  Father's history is unremarkable for coronary disease.   REVIEW OF SYSTEMS:  Review of systems essentially unremarkable except for  noted above.  She denies any GI bleeding.  She denies any bladder problems  or hematuria.  She does have occasional palpitations.  She has not had  sustained tachycardia or syncope.   PHYSICAL EXAMINATION:  VITAL SIGNS:  BP 150/100, pulse 80, respirations 12.  GENERAL:  She is a well-developed, obese, African-American female in no  acute distress.  HEENT:  Normocephalic.  Extraocular movements are intact.  Sclerae are  nonicteric.  NECK:  Without JVD and without bruit.  CHEST:  Clear to auscultation and percussion.  CARDIAC:  Exam reveals regular rate and rhythm with a short soft systolic  murmur at the left sternal border.  Normal S1 and S2.  ABDOMEN:  Nontender, obese.  EXTREMITIES:  Reveal massive edema in both lower extremities, left greater  than right.  NEUROLOGIC:  Exam is grossly intact.  She is awake, alert and oriented,  cooperative and moves all  extremities without obvious deficit.   LABORATORY DATA:  EKG shows sinus rhythm without acute changes.  Labs from  [place]:  White count 8.9, hemoglobin 13.4, hematocrit 39.9, platelets  307.  Troponin is negative x1.  Sodium 137, potassium 2.7, BUN 6, creatinine  0.8.   IMPRESSION:  1.  Chest pain consistent with unstable angina.  2.  Insulin-dependent diabetes.  3.  Chronic lymphedema.  4.  Hypertension currently not treated.  5.  Family history of coronary disease.   PLAN:  The patient will be admitted to telemetry.  We will continue her  Lovenox which was started in [place].  She will need aspirin, beta  blocker and PPI.  She will probably need diagnostic catheterization.      Erlene Quan, P.A.    ______________________________  Bryson Dames, M.D.    Meryl Dare  D:  05/20/2006  T:  05/20/2006  Job:  [id number]

## 2011-10-04 ENCOUNTER — Ambulatory Visit (HOSPITAL_COMMUNITY)
Admission: RE | Admit: 2011-10-04 | Discharge: 2011-10-04 | Disposition: A | Discharge status: Home / Self Care | Payer: PRIVATE HEALTH INSURANCE | Source: Ambulatory Visit | Attending: Pulmonary Disease | Admitting: Pulmonary Disease

## 2011-10-04 DIAGNOSIS — I890 Lymphedema, not elsewhere classified: Secondary | ICD-10-CM | POA: Insufficient documentation

## 2011-10-04 DIAGNOSIS — IMO0001 Reserved for inherently not codable concepts without codable children: Secondary | ICD-10-CM | POA: Insufficient documentation

## 2011-10-04 DIAGNOSIS — M79609 Pain in unspecified limb: Secondary | ICD-10-CM | POA: Insufficient documentation

## 2011-10-04 DIAGNOSIS — M7989 Other specified soft tissue disorders: Secondary | ICD-10-CM | POA: Insufficient documentation

## 2011-10-04 DIAGNOSIS — R262 Difficulty in walking, not elsewhere classified: Secondary | ICD-10-CM | POA: Insufficient documentation

## 2011-10-04 NOTE — Progress Notes (Signed)
Physical Therapy Evaluation  Patient Details  Name: [name] MRN: [id number] Date of Birth: 1966-01-11  Today's Date: 10/04/2011 Time: 1130-1215 Time Calculation (min): 45 min Visit#: 1  of 12   Re-eval: 11/03/11  Charge:  Evaluation  Past Medical History: No past medical history on file. Past Surgical History: No past surgical history on file.  Subjective Symptoms/Limitations Symptoms: [name] states that she slowly notices inceases swelling in her LE 26 years ago.  The patient states that it was not out of control until five to six years ago.  She states that now both of her legs are affected.  The patient states that in the past she has worn TED hose but has not worn them in eight years.  The patient states that she has used the Jobst pump in the past but her legs outgrew  the machine and then the machine broke.  The patient states that she is on fluid pills for her edema.  The patient states that she started to have an infection in her left leg aroung October 2nd.   She states that she was in so much pain that  she could not walk on her leg.  The MD has placed her on antibiotics.  She states she is doing better now but she is still having significant pain. How long can you sit comfortably?: The patient states that she can only sit for three to five minutes. How long can you stand comfortably?: She is only able to walk for three to five minutes. How long can you walk comfortably?: The patient states that she is uses a cane to walk.  She started using the cane because it is difficult for her to walk without the cane due to balance approximately a year ago.  The patient can not walk over five minutes. Pain Assessment Currently in Pain?: Yes Pain Score:   8 Pain Location: Leg (when her leg is in a dependent position.) Pain Orientation: Left Pain Type: Chronic pain Pain Relieving Factors: elevation Effect of Pain on Daily Activities: increases      Objective: The patient is  unable to lift her left leg.  Measurements were taken and sent to scanner.  Both legs have lymphedema but the left is markedly affected.  There is a 24,842 cc difference.    Exercise/Treatments Given general ROM for LE exercises.    Physical Therapy Assessment and Plan PT Assessment and Plan Clinical Impression Statement: Pt with bilaterall lymphedema with left being more severe than right who will benefit skilled physical therapy to decrase fluid volume, educate and improve the quality of life Rehab Potential: Good Clinical Impairments Affecting Rehab Potential: Lymphedema, weakness  PT Frequency: Min 3X/week PT Duration: 4 weeks PT Treatment/Interventions: Patient/family education;Therapeutic exercise;Other (comment) (manual for reducing lymphedema) PT Plan: Pt still has active infection will begin therapy next week.    Goals PT Short Term Goals PT Short Term Goal 1: Pt will be I in skin care  PT Short Term Goal 2: Pt will verbalize precautions for decreaseing risk of infection PT Short Term Goal 3: Pt will be I in an execise program PT Short Term Goal 4: reduce volume by 25% PT Long Term Goals Time to Complete Long Term Goals: 4 weeks PT Long Term Goal 1: Reduce volume by 50% PT Long Term Goal 2: Verbalize understaning of the maintenance phase of treatment Long Term Goal 3: verbalize understanding of where tand how to be fitted for a compression garment  Problem  List Patient Active Problem List  Diagnoses  . Lymphedema of lower extremity    PT - End of Session Activity Tolerance: Patient tolerated treatment well General Behavior During Session: [facility] for tasks performed Cognition: [facility] for tasks performed   RUSSELL,CINDY 10/04/2011, 5:25 PM  Physician Documentation Your signature is required to indicate approval of the treatment plan as stated above.  Please sign and either send electronically or make a copy of this report for your files and return this physician signed  original.   Please mark one 1.__approve of plan  2. ___approve of plan with the following conditions.   ______________________________                                                          _____________________ Physician Signature                                                                                                             Date

## 2011-10-04 NOTE — Patient Instructions (Addendum)
HEP

## 2011-10-13 ENCOUNTER — Ambulatory Visit (HOSPITAL_COMMUNITY): Payer: PRIVATE HEALTH INSURANCE | Admitting: Physical Therapy

## 2011-10-19 ENCOUNTER — Ambulatory Visit (HOSPITAL_COMMUNITY): Payer: PRIVATE HEALTH INSURANCE | Admitting: Physical Therapy

## 2011-10-21 ENCOUNTER — Ambulatory Visit (HOSPITAL_COMMUNITY): Payer: PRIVATE HEALTH INSURANCE | Admitting: Physical Therapy

## 2011-10-21 DIAGNOSIS — Z9289 Personal history of other medical treatment: Secondary | ICD-10-CM

## 2011-10-21 HISTORY — DX: Personal history of other medical treatment: Z92.89

## 2011-10-26 ENCOUNTER — Other Ambulatory Visit: Payer: Self-pay

## 2011-10-26 ENCOUNTER — Encounter: Payer: Self-pay | Admitting: *Deleted

## 2011-10-26 ENCOUNTER — Ambulatory Visit (HOSPITAL_COMMUNITY)
Admission: RE | Admit: 2011-10-26 | Discharge: 2011-10-26 | Disposition: A | Discharge status: Home / Self Care | Payer: PRIVATE HEALTH INSURANCE | Source: Ambulatory Visit | Attending: Pulmonary Disease | Admitting: Pulmonary Disease

## 2011-10-26 ENCOUNTER — Inpatient Hospital Stay (HOSPITAL_COMMUNITY)
Admission: EM | Admit: 2011-10-26 | Discharge: 2011-11-01 | DRG: 603 | Disposition: A | Discharge status: Home Health | Payer: PRIVATE HEALTH INSURANCE | Attending: Pulmonary Disease | Admitting: Pulmonary Disease

## 2011-10-26 ENCOUNTER — Emergency Department (HOSPITAL_COMMUNITY): Payer: PRIVATE HEALTH INSURANCE

## 2011-10-26 DIAGNOSIS — L03119 Cellulitis of unspecified part of limb: Principal | ICD-10-CM | POA: Diagnosis present

## 2011-10-26 DIAGNOSIS — I890 Lymphedema, not elsewhere classified: Secondary | ICD-10-CM | POA: Diagnosis present

## 2011-10-26 DIAGNOSIS — L02419 Cutaneous abscess of limb, unspecified: Principal | ICD-10-CM | POA: Diagnosis present

## 2011-10-26 DIAGNOSIS — L03116 Cellulitis of left lower limb: Secondary | ICD-10-CM | POA: Diagnosis present

## 2011-10-26 DIAGNOSIS — E119 Type 2 diabetes mellitus without complications: Secondary | ICD-10-CM | POA: Diagnosis present

## 2011-10-26 DIAGNOSIS — M79609 Pain in unspecified limb: Secondary | ICD-10-CM | POA: Insufficient documentation

## 2011-10-26 DIAGNOSIS — L0390 Cellulitis, unspecified: Secondary | ICD-10-CM

## 2011-10-26 DIAGNOSIS — R6510 Systemic inflammatory response syndrome (SIRS) of non-infectious origin without acute organ dysfunction: Secondary | ICD-10-CM

## 2011-10-26 DIAGNOSIS — R262 Difficulty in walking, not elsewhere classified: Secondary | ICD-10-CM | POA: Insufficient documentation

## 2011-10-26 DIAGNOSIS — R739 Hyperglycemia, unspecified: Secondary | ICD-10-CM

## 2011-10-26 DIAGNOSIS — M7989 Other specified soft tissue disorders: Secondary | ICD-10-CM | POA: Insufficient documentation

## 2011-10-26 DIAGNOSIS — IMO0001 Reserved for inherently not codable concepts without codable children: Secondary | ICD-10-CM | POA: Insufficient documentation

## 2011-10-26 DIAGNOSIS — Z6841 Body Mass Index (BMI) 40.0 and over, adult: Secondary | ICD-10-CM

## 2011-10-26 HISTORY — DX: Lymphedema, not elsewhere classified: I89.0

## 2011-10-26 HISTORY — DX: Other chronic pain: G89.29

## 2011-10-26 LAB — COMPREHENSIVE METABOLIC PANEL
ALT: 12 U/L (ref 0–35)
AST: 14 U/L (ref 0–37)
CO2: 29 mEq/L (ref 19–32)
Calcium: 9.8 mg/dL (ref 8.4–10.5)
Creatinine, Ser: 0.77 mg/dL (ref 0.50–1.10)
GFR calc non Af Amer: >90 mL/min (ref >90)
Sodium: 133 mEq/L — ABNORMAL LOW (ref 135–145)
Total Protein: 8.2 g/dL (ref 6.0–8.3)

## 2011-10-26 LAB — URINALYSIS, ROUTINE W REFLEX MICROSCOPIC
Nitrite: NEGATIVE
Specific Gravity, Urine: 1.025 (ref 1.005–1.030)
Urobilinogen, UA: 0.2 mg/dL (ref 0.0–1.0)
pH: 6 (ref 5.0–8.0)

## 2011-10-26 LAB — D-DIMER, QUANTITATIVE: D-Dimer, Quant: 0.56 ug/mL-FEU — ABNORMAL HIGH (ref 0.00–0.48)

## 2011-10-26 LAB — CBC
HCT: 40.6 % (ref 36.0–46.0)
MCH: 28.9 pg (ref 26.0–34.0)
MCHC: 32.8 g/dL (ref 30.0–36.0)
MCV: 88.1 fL (ref 78.0–100.0)
Platelets: 332 10*3/uL (ref 150–400)
RDW: 15.1 % (ref 11.5–15.5)
WBC: 17.2 10*3/uL — ABNORMAL HIGH (ref 4.0–10.5)

## 2011-10-26 LAB — URINE MICROSCOPIC-ADD ON

## 2011-10-26 LAB — DIFFERENTIAL
Basophils Absolute: 0 10*3/uL (ref 0.0–0.1)
Basophils Relative: 0 % (ref 0–1)
Eosinophils Absolute: 0.1 10*3/uL (ref 0.0–0.7)
Eosinophils Relative: 1 % (ref 0–5)
Lymphocytes Relative: 6 % — ABNORMAL LOW (ref 12–46)
Monocytes Absolute: 0.6 10*3/uL (ref 0.1–1.0)

## 2011-10-26 LAB — POCT I-STAT TROPONIN I: Troponin i, poc: 0 ng/mL (ref 0.00–0.08)

## 2011-10-26 LAB — GLUCOSE, CAPILLARY: Glucose-Capillary: 288 mg/dL — ABNORMAL HIGH (ref 70–99)

## 2011-10-26 MED ORDER — HYDROMORPHONE HCL PF 1 MG/ML IJ SOLN
1.0000 mg | Freq: Once | INTRAMUSCULAR | Status: AC
  Administered 2011-10-26: 1 mg via INTRAVENOUS
  Filled 2011-10-26: qty 1

## 2011-10-26 MED ORDER — POTASSIUM CHLORIDE CRYS ER 20 MEQ PO TBCR
20.0000 meq | EXTENDED_RELEASE_TABLET | Freq: Three times a day (TID) | ORAL | Status: DC
  Administered 2011-10-26 – 2011-11-01 (×17): 20 meq via ORAL
  Filled 2011-10-26 (×17): qty 1

## 2011-10-26 MED ORDER — VANCOMYCIN HCL IN DEXTROSE 1-5 GM/200ML-% IV SOLN
1000.0000 mg | Freq: Once | INTRAVENOUS | Status: AC
  Administered 2011-10-26: 1000 mg via INTRAVENOUS
  Filled 2011-10-26: qty 200

## 2011-10-26 MED ORDER — ENOXAPARIN SODIUM 150 MG/ML ~~LOC~~ SOLN
1.0000 mg/kg | Freq: Once | SUBCUTANEOUS | Status: AC
  Administered 2011-10-26: 150 mg via SUBCUTANEOUS
  Filled 2011-10-26: qty 1

## 2011-10-26 MED ORDER — OXYCODONE-ACETAMINOPHEN 5-325 MG PO TABS
1.0000 | ORAL_TABLET | Freq: Four times a day (QID) | ORAL | Status: DC | PRN
  Administered 2011-10-27 – 2011-11-01 (×17): 1 via ORAL
  Filled 2011-10-26 (×17): qty 1

## 2011-10-26 MED ORDER — TORSEMIDE 20 MG PO TABS
50.0000 mg | ORAL_TABLET | Freq: Two times a day (BID) | ORAL | Status: DC
  Administered 2011-10-27 – 2011-10-31 (×8): 50 mg via ORAL
  Filled 2011-10-26 (×10): qty 3

## 2011-10-26 MED ORDER — ACETAMINOPHEN 325 MG PO TABS
650.0000 mg | ORAL_TABLET | Freq: Once | ORAL | Status: AC
  Administered 2011-10-26: 650 mg via ORAL
  Filled 2011-10-26: qty 2

## 2011-10-26 MED ORDER — ENOXAPARIN SODIUM 40 MG/0.4ML ~~LOC~~ SOLN
40.0000 mg | SUBCUTANEOUS | Status: DC
  Administered 2011-10-27: 40 mg via SUBCUTANEOUS
  Filled 2011-10-26: qty 0.4

## 2011-10-26 MED ORDER — SODIUM CHLORIDE 0.9 % IV SOLN
INTRAVENOUS | Status: DC
  Administered 2011-10-27 – 2011-10-28 (×2): via INTRAVENOUS
  Administered 2011-10-29: 1000 mL via INTRAVENOUS
  Administered 2011-10-30 – 2011-11-01 (×5): via INTRAVENOUS

## 2011-10-26 MED ORDER — PIPERACILLIN-TAZOBACTAM 3.375 G IVPB
3.3750 g | Freq: Once | INTRAVENOUS | Status: AC
  Administered 2011-10-26: 3.375 g via INTRAVENOUS
  Filled 2011-10-26: qty 50

## 2011-10-26 MED ORDER — SODIUM CHLORIDE 0.9 % IV BOLUS (SEPSIS)
1000.0000 mL | Freq: Once | INTRAVENOUS | Status: AC
  Administered 2011-10-26: 1000 mL via INTRAVENOUS

## 2011-10-26 NOTE — ED Notes (Signed)
Report given to Varney Baas, [place] nurse for room 304

## 2011-10-26 NOTE — ED Notes (Signed)
Pt c/o fever, chest pressure, dizziness and nausea since 2 pm. Pt states that she was lying down when the pain started. Also c/o chills and back pain.

## 2011-10-26 NOTE — Progress Notes (Signed)
Physical Therapy Treatment Patient Details  Name: [name] MRN: [id number] Date of Birth: 06-21-1966  Today's Date: 10/26/2011 Time: 1103-1208 Time Calculation (min): 65 min Visit#: 2  of 12   Re-eval: 11/03/11 Charges:  Manual 50 minutes    Subjective:  Pt. Reports she has finished antibiotics and wounds are now cleared up.  States she has not been at therapy due to illness but is much better now. Feels her legs are worse now than last visit due to be bed bound with illness. Pt. Became tearful, stating she wants to be able to wear shoes again and is very embarrassed of her condition.  Pt. Wishes to get husband involved so he can begin doing manual drainage as well. (he is wheelchair bound).  Pt. Reports 8/10 pain in B LE's.    Objective:  Pt. Presents with stage 3 lymphedema with distal fibrosis B LE's.  L LE is notably worse than R LE, with hyperkeratosis and papillomas present.  Pt. Will require extensive CLT.    Exercise/Treatments  Manual Therapy Manual Therapy:  (Manual Lymph Drainage)  Physical Therapy Assessment and Plan PT Assessment and Plan Clinical Impression Statement: Pt. will need extensive, daily complete decongestive therapy for her LE's.  Pt. given information to get bandaging kits for B LE's and also contacted Rolla Etienne at [place] [facility] to insure she had those available.  PT Treatment/Interventions:  (Manual lymph drainage) PT Plan: Continue to reduce lymph, begin bandaging when made available.     Problem List Patient Active Problem List  Diagnoses  . Lymphedema of lower extremity    PT - End of Session Activity Tolerance: Patient tolerated treatment well General Behavior During Session: [facility] for tasks performed Cognition: [facility] for tasks performed  Roseanne Reno B 10/26/2011, 3:57 PM

## 2011-10-26 NOTE — ED Provider Notes (Signed)
History     CSN: [id number] Arrival date & time: 10/26/2011  6:03 PM   First MD Initiated Contact with Patient 10/26/11 1811      Chief Complaint  Patient presents with  . Chest Pain    (Consider location/radiation/quality/duration/timing/severity/associated sxs/prior treatment) HPI Patient with chills and fever which began today followed by sharp anterior chest pain.  Some dyspnea and cough, nonproductive.  Patient states nauseated earlier but no vomiting or diarrhea.  Patient has been taking po well.  Patient states she had cellulitis lle three weeks ago treated outpatient with unknown abx until 1.5 weeks ago.    Past Medical History  Diagnosis Date  . Diabetes mellitus   . Chronic pain   . Lymph edema     Past Surgical History  Procedure Date  . Cesarean section      x 2  . Abdominal hysterectomy   . Tonsillectomy   . Adenoidectomy   . Thyroidectomy, partial   . Craniectomy suboccipital w/ cervical laminectomy / chiari     History reviewed. No pertinent family history.  History  Substance Use Topics  . Smoking status: Never Smoker   . Smokeless tobacco: Not on file  . Alcohol Use: No    OB History    Grav Para Term Preterm Abortions TAB SAB Ect Mult Living                  Review of Systems  All other systems reviewed and are negative.    Allergies  Other  Home Medications  No current outpatient prescriptions on file.  BP 223/113  Pulse 122  Temp(Src) 100.3 F (37.9 C) (Oral)  Resp 24  Ht _0  (1.651 m)  Wt 330 lb (149.687 kg)  BMI 54.91 kg/m2  SpO2 96%  Physical Exam  Nursing note and vitals reviewed. Constitutional: She is oriented to person, place, and time. She appears well-developed and well-nourished.  HENT:  Head: Normocephalic and atraumatic.  Eyes: Conjunctivae and EOM are normal. Pupils are equal, round, and reactive to light.  Neck: Normal range of motion. Neck supple.  Cardiovascular: Regular rhythm.  Tachycardia present.    Pulmonary/Chest: Effort normal and breath sounds normal.       Tender to palpation over midsternum  Abdominal: Soft. Bowel sounds are normal.  Musculoskeletal:       Legs swollen severely bilaterally with some erythema behind left knee with warmth l>right  Lymphadenopathy:       Left axillary: Lateral adenopathy: bilateral lower extremity with severe lymphedema.  Neurological: She is alert and oriented to person, place, and time.  Skin: Skin is warm and dry.    ED Course  Procedures (including critical care time)  Labs Reviewed - No data to display No results found.   No diagnosis found.   Date: 10/26/2011  Rate: 122  Rhythm: sinus tachycardia  QRS Axis: normal  Intervals: normal  ST/T Wave abnormalities: normal  Conduction Disutrbances:none  Narrative Interpretation:   Old EKG Reviewed: none available    MDM  Patient had 1 L of normal saline and continues to be tachycardic at 119.  The patient's blood pressure continues to be elevated with a systolic blood pressure of 150. Patient's white blood cell count is elevated at 17,200 with 90% neutrophils. I suspect cellulitis as the source. She is not hypertensive but continues tachycardic and is relatively decreased blood pressure from presentation. He is being treated aggressively with broad-spectrum antibiotics and IV fluids. She has sharp anterior chest  pain but does not clinically appear to be cardiac in nature. She has a chest x-Gurpreet Mariani that does not show acute infiltrate. EKG does not show any acute ischemia. She does have an elevated d-dimer in the face of her severe lymphedema is set up for DVT. She has an allergy to IV dye. She is anticoagulated he was Lovenox. She is also hyperglycemic and has had 1 L of normal saline and is having her blood sugar rechecked at this time.   I discussed her care with Dr. Legrand Rams 10 she'll be admitted to Dr. [name] service via Dr. Legrand Rams  Patient does not have any acute EKG changes. She does  not have CRITICAL CARE Performed by: Marsh Heckler S   Total critical care time 30 minutes  Critical care time was exclusive of separately billable procedures and treating other patients.  Critical care was necessary to treat or prevent imminent or life-threatening deterioration.  Critical care was time spent personally by me on the following activities: development of treatment plan with patient and/or surrogate as well as nursing, discussions with consultants, evaluation of patient's response to treatment, examination of patient, obtaining history from patient or surrogate, ordering and performing treatments and interventions, ordering and review of laboratory studies, ordering and review of radiographic studies, pulse oximetry and re-evaluation of patient's condition.    Shaune Pollack, MD 10/26/11 2012

## 2011-10-26 NOTE — ED Notes (Signed)
Bed 304 assigned

## 2011-10-27 ENCOUNTER — Telehealth (HOSPITAL_COMMUNITY): Payer: Self-pay

## 2011-10-27 DIAGNOSIS — E119 Type 2 diabetes mellitus without complications: Secondary | ICD-10-CM | POA: Diagnosis present

## 2011-10-27 DIAGNOSIS — L03116 Cellulitis of left lower limb: Secondary | ICD-10-CM | POA: Diagnosis present

## 2011-10-27 LAB — GLUCOSE, CAPILLARY
Glucose-Capillary: 256 mg/dL — ABNORMAL HIGH (ref 70–99)
Glucose-Capillary: 285 mg/dL — ABNORMAL HIGH (ref 70–99)
Glucose-Capillary: 81 mg/dL (ref 70–99)

## 2011-10-27 MED ORDER — SODIUM CHLORIDE 0.9 % IV SOLN
1250.0000 mg | Freq: Two times a day (BID) | INTRAVENOUS | Status: DC
  Administered 2011-10-27 – 2011-10-28 (×4): 1250 mg via INTRAVENOUS
  Filled 2011-10-27 (×5): qty 1250

## 2011-10-27 MED ORDER — PIPERACILLIN-TAZOBACTAM 3.375 G IVPB
3.3750 g | Freq: Once | INTRAVENOUS | Status: AC
  Administered 2011-10-27: 3.375 g via INTRAVENOUS
  Filled 2011-10-27: qty 50

## 2011-10-27 MED ORDER — INSULIN ASPART 100 UNIT/ML ~~LOC~~ SOLN
0.0000 [IU] | Freq: Three times a day (TID) | SUBCUTANEOUS | Status: DC
  Administered 2011-10-27: 11 [IU] via SUBCUTANEOUS
  Administered 2011-10-28 (×2): 3 [IU] via SUBCUTANEOUS
  Administered 2011-10-29: 4 [IU] via SUBCUTANEOUS
  Administered 2011-10-29: 7 [IU] via SUBCUTANEOUS
  Administered 2011-10-29 – 2011-10-30 (×4): 3 [IU] via SUBCUTANEOUS
  Administered 2011-10-31: 7 [IU] via SUBCUTANEOUS
  Administered 2011-10-31: 11 [IU] via SUBCUTANEOUS
  Administered 2011-10-31: 4 [IU] via SUBCUTANEOUS
  Administered 2011-11-01: 7 [IU] via SUBCUTANEOUS
  Administered 2011-11-01: 4 [IU] via SUBCUTANEOUS
  Filled 2011-10-27 (×2): qty 3

## 2011-10-27 MED ORDER — INSULIN ASPART 100 UNIT/ML ~~LOC~~ SOLN
40.0000 [IU] | Freq: Two times a day (BID) | SUBCUTANEOUS | Status: DC
  Administered 2011-10-27 – 2011-11-01 (×10): 40 [IU] via SUBCUTANEOUS
  Filled 2011-10-27: qty 3

## 2011-10-27 MED ORDER — INSULIN ASPART 100 UNIT/ML ~~LOC~~ SOLN: 0.0000 [IU] | Freq: Every day | SUBCUTANEOUS | Status: DC

## 2011-10-27 MED ORDER — PIPERACILLIN-TAZOBACTAM 3.375 G IVPB
3.3750 g | Freq: Three times a day (TID) | INTRAVENOUS | Status: DC
  Administered 2011-10-27 – 2011-11-01 (×14): 3.375 g via INTRAVENOUS
  Filled 2011-10-27 (×16): qty 50

## 2011-10-27 MED ORDER — PIPERACILLIN-TAZOBACTAM 3.375 G IVPB
INTRAVENOUS | Status: AC
  Filled 2011-10-27: qty 50

## 2011-10-27 NOTE — H&P (Signed)
[name], [name]              ACCOUNT NO.:  [id number]  MEDICAL RECORD NO.:  [id number]  LOCATION:  [facility]                           FACILITY:  [facility]  PHYSICIAN:  Madinah Quarry D. Legrand Rams, MD   DATE OF BIRTH:  08-26-1966  DATE OF ADMISSION:  10/26/2011 DATE OF DISCHARGE:  11/06/2012LH                             HISTORY & PHYSICAL   CHIEF COMPLAINT:  Pain and swelling of the lower extremity.  HISTORY OF PRESENT ILLNESS:  This is a 45 year old female patient with history of diabetes mellitus and chronic lower extremity lymphedema, came with above complaints.  The patient was recently treated for cellulitis of the lower extremity.  She completed her antibiotics.  She started developing pain, swelling, and fever.  She had a low-grade fever.  She was evaluated in the emergency room and was found to have painful swelling and irritation of the left lower extremity, where she had massive chronic lymphedema.  Her WBC also shows a significant leukocytosis.  The patient was started on a combination of IV antibiotics and she was admitted for further treatment.  PAST MEDICAL HISTORY: 1. Chronic lymphedema of lower extremities. 2. Diabetes mellitus. 3. Hypertension. 4. History of chest pain. 5. History of gastroenteritis.  CURRENT MEDICATIONS: 1. Humalog insulin 60 units subcu b.i.d. 2. Percocet 5/325 one tablet p.o. q.4 h. p.r.n. 3. Demadex 50 mg b.i.d. 4. Potassium 20 mEq t.i.d.  SOCIAL HISTORY:  Patient is married.  No history of alcohol, tobacco, or substance abuse.  REVIEW OF SYSTEMS:  The patient has generalized pain and weakness.  She has a low-grade fever.  No headache, chest pain, cough, shortness of breath, nausea, vomiting, abdominal pain, dysuria, urgency, or frequency of urination.  FAMILY HISTORY:  Her mother has history of breast cancer and cardiac disease.  Her father had no significant known illness.  PHYSICAL EXAMINATION:  GENERAL:  The patient is alert, awake,  and chronically sick looking and obese. VITAL SIGNS:  Blood pressure 145/69, pulse 106, respiratory rate 14, temperature 99.3 degrees Fahrenheit. HEENT:  Pupils are equal and reactive. NECK:  Supple. CHEST:  Decreased air entry.  Few rhonchi. CARDIOVASCULAR:  First and second heart sounds heard.  No murmur.  No gallop.  ABDOMEN:  Obese, soft, and lax.  Bowel sound is positive.  No mass or organomegaly. EXTREMITIES:  The patient has bilateral severe lymphedema, left is greater than the right.  The patient has also tenderness and swelling of the left leg.  LABORATORY DATA:  On admission CBC, WBC 17.2, hemoglobin 13.3, hematocrit 40.6, and platelets 332.  BMP, sodium 133, potassium 3.6, chloride 94, carbon dioxide 29, glucose 291, BUN 13, and calcium 9.8.  ASSESSMENT: 1. Cellulitis of the lower extremities. 2. Chronic lymphedema. 3. Diabetes mellitus. 4. Morbid obesity.  PLAN:  We will continue patient on a combination of IV antibiotics. Continue insulin therapy.  Continue pain management and supportive care.     Jabir Dahlem D. Legrand Rams, MD     TDF/MEDQ  D:  10/27/2011  T:  10/27/2011  Job:  [id number]

## 2011-10-27 NOTE — Progress Notes (Signed)
Spoke with patient about her current insulin regimen.  Patient states that she is no longer using an insulin pump but uses Humalog 60 units BID with meals.  She stated this was not the mixed insulin but just humalog.  No insulin orders placed for patient.  Called Dr. [name] and received orders for meal coverage and SSI as well.  Will continue to follow.

## 2011-10-27 NOTE — Progress Notes (Signed)
Subjective: She was admitted last night with cellulitis. She has severe lymphedema of both legs the left greater than the right. Her left leg shows cellulitis in the thigh. She says she feels a little bit better. She had flulike symptoms.  Objective: Vital signs in last 24 hours: Temp:  [99.1 F (37.3 C)-100.3 F (37.9 C)] 99.1 F (37.3 C) (11/07 3976) Pulse Rate:  [106-126] 111  (11/07 0542) Resp:  [14-28] 20  (11/07 0542) BP: (119-223)/(69-113) 119/75 mmHg (11/07 0542) SpO2:  [94 %-96 %] 95 % (11/07 0542) Weight:  [149.687 kg (330 lb)] 330 lb (149.687 kg) (11/06 2134) Weight change:     Intake/Output from previous day:    PHYSICAL EXAM General appearance: alert, cooperative and mild distress Resp: clear to auscultation bilaterally Cardio: regular rate and rhythm, S1, S2 normal, no murmur, click, rub or gallop GI: soft, non-tender; bowel sounds normal; no masses,  no organomegaly Extremities: She has marked lymphedema of both lower extremities left is greater than right. She has inflammation and swelling of her left thigh and her thigh is hot  Lab Results:    Basic Metabolic Panel:  Basename 10/26/11 1831  NA 133*  K 3.6  CL 94*  CO2 29  GLUCOSE 291*  BUN 13  CREATININE 0.77  CALCIUM 9.8  MG --  PHOS --   Liver Function Tests:  [facility] 10/26/11 1831  AST 14  ALT 12  ALKPHOS 89  BILITOT 0.5  PROT 8.2  ALBUMIN 3.4*   No results found for this basename: LIPASE:2,AMYLASE:2 in the last 72 hours No results found for this basename: AMMONIA:2 in the last 72 hours CBC:  Basename 10/26/11 1831  WBC 17.2*  NEUTROABS 15.4*  HGB 13.3  HCT 40.6  MCV 88.1  PLT 332   Cardiac Enzymes: No results found for this basename: CKTOTAL:3,CKMB:3,CKMBINDEX:3,TROPONINI:3 in the last 72 hours BNP: No results found for this basename: POCBNP:3 in the last 72 hours D-Dimer:  [facility] 10/26/11 1831  DDIMER 0.56*   CBG:  Basename 10/27/11 0733 10/26/11 1949  GLUCAP 256*  288*   Hemoglobin A1C: No results found for this basename: HGBA1C in the last 72 hours Fasting Lipid Panel: No results found for this basename: CHOL,HDL,LDLCALC,TRIG,CHOLHDL,LDLDIRECT in the last 72 hours Thyroid Function Tests: No results found for this basename: TSH,T4TOTAL,FREET4,T3FREE,THYROIDAB in the last 72 hours Anemia Panel: No results found for this basename: VITAMINB12,FOLATE,FERRITIN,TIBC,IRON,RETICCTPCT in the last 72 hours Coagulation: No results found for this basename: LABPROT:2,INR:2 in the last 72 hours Urine Drug Screen:  Alcohol Level: No results found for this basename: ETH:2 in the last 72 hours Urinalysis:  Misc. Labs:  ABGS No results found for this basename: PHART,PCO2,PO2ART,TCO2,HCO3 in the last 72 hours CULTURES No results found for this or any previous visit (from the past 240 hour(s)). Studies/Results: Dg Chest Portable 1 View  10/26/2011  *RADIOLOGY REPORT*  Clinical Data: Fever.  Cough.  PORTABLE CHEST - 1 VIEW  Comparison: 05/27/2010.  Findings: Cardiomegaly.  Central pulmonary vascular prominence.  No segmental infiltrate.  Limited evaluation of the mediastinum by technique.  The patient would eventually benefit from follow-up two-view chest with cardiac leads removed.  No gross pneumothorax.  IMPRESSION: Cardiomegaly with central pulmonary vascular prominence.  Please see above.  Original Report Authenticated By: Doug Sou, M.D.    Medications:  Prior to Admission:  Prescriptions prior to admission  Medication Sig Dispense Refill  . insulin lispro (HUMALOG) 100 UNIT/ML injection Inject 60 Units into the skin 2 (two) times  daily.        . oxyCODONE-acetaminophen (PERCOCET) 5-325 MG per tablet Take 1 tablet by mouth 4 (four) times daily as needed. For pain       . potassium chloride SA (K-DUR,KLOR-CON) 20 MEQ tablet Take 20 mEq by mouth 3 (three) times daily.        Marland Kitchen torsemide (DEMADEX) 100 MG tablet Take 50 mg by mouth 2 (two) times daily.          Scheduled:   . acetaminophen  650 mg Oral Once  . enoxaparin  1 mg/kg Subcutaneous Once  . enoxaparin (LOVENOX) injection  40 mg Subcutaneous Q24H  .  HYDROmorphone (DILAUDID) injection  1 mg Intravenous Once  . piperacillin-tazobactam (ZOSYN)  IV  3.375 g Intravenous Once  . piperacillin-tazobactam (ZOSYN)  IV  3.375 g Intravenous Once  . piperacillin-tazobactam (ZOSYN)  IV  3.375 g Intravenous Q8H  . potassium chloride  20 mEq Oral TID  . sodium chloride  1,000 mL Intravenous Once  . torsemide  50 mg Oral BID  . vancomycin  1,250 mg Intravenous Q12H  . vancomycin  1,000 mg Intravenous Once   Continuous:   . sodium chloride 75 mL/hr at 10/27/11 0039   SLP:[id number]  Assesment: She has cellulitis. She has chronic lymphedema. She has diabetes. She has morbid obesity Active Problems:  * No active hospital problems. *     Plan: Continue with vancomycin and Zosyn and treatment for her diabetes    LOS: 1 day   Ayham Word L 10/27/2011, 8:42 AM

## 2011-10-27 NOTE — Consult Note (Signed)
ANTIBIOTIC CONSULT NOTE - INITIAL  Pharmacy Consult for vancomycin and zosyn Indication: cellulitis  Allergies  Allergen Reactions  . Other     Pneumonia Vaccine   Patient Measurements: Height: _0  (165.1 cm) Weight: 330 lb (149.687 kg) IBW/kg (Calculated) : 57   Vital Signs: Temp: 99.1 F (37.3 C) (11/07 0017) Temp src: Oral (11/07 0542) BP: 119/75 mmHg (11/07 0542) Pulse Rate: 111  (11/07 0542) Intake/Output from previous day:   Intake/Output from this shift:    Labs:  [facility] 10/26/11 1831  WBC 17.2*  HGB 13.3  PLT 332  LABCREA --  CREATININE 0.77   Estimated Creatinine Clearance: 131.9 ml/min (by C-G formula based on Cr of 0.77). No results found for this basename: VANCOTROUGH:2,VANCOPEAK:2,VANCORANDOM:2,GENTTROUGH:2,GENTPEAK:2,GENTRANDOM:2,TOBRATROUGH:2,TOBRAPEAK:2,TOBRARND:2,AMIKACINPEAK:2,AMIKACINTROU:2,AMIKACIN:2, in the last 72 hours   Microbiology: No results found for this or any previous visit (from the past 720 hour(s)).  Medical History: Past Medical History  Diagnosis Date  . Diabetes mellitus   . Chronic pain   . Lymph edema    Medications:  Scheduled:    . acetaminophen  650 mg Oral Once  . enoxaparin  1 mg/kg Subcutaneous Once  . enoxaparin (LOVENOX) injection  40 mg Subcutaneous Q24H  .  HYDROmorphone (DILAUDID) injection  1 mg Intravenous Once  . piperacillin-tazobactam (ZOSYN)  IV  3.375 g Intravenous Once  . piperacillin-tazobactam (ZOSYN)  IV  3.375 g Intravenous Once  . piperacillin-tazobactam (ZOSYN)  IV  3.375 g Intravenous Q8H  . potassium chloride  20 mEq Oral TID  . sodium chloride  1,000 mL Intravenous Once  . torsemide  50 mg Oral BID  . vancomycin  1,250 mg Intravenous Q12H  . vancomycin  1,000 mg Intravenous Once   Assessment: Obese Excellent renal fxn  Goal of Therapy:  Vancomycin trough level 10-15 mcg/ml  Plan: Zosyn 3.375gm iv q8hrs Vancomycin 1261m iv q12hrs Check trough at steady state Labs per  protocol  HHart RobinsonsA 10/27/2011,10:35 AM

## 2011-10-27 NOTE — Progress Notes (Signed)
Pt states that she has been receiving therapy for treatment of lymphedema here at [facility].  She states that she is not having any problems with mobility and does not need PT for any other reason than lymphedema.  I am not certain that Dr. [name] ordered a PT eval for any other reason than assess of strength, mobility.  We will d/c orders due to pt's declining of PT.  Lymphedema therapy can be resumed as an outpatient

## 2011-10-27 NOTE — Progress Notes (Signed)
UR Chart Review Completed  

## 2011-10-28 ENCOUNTER — Inpatient Hospital Stay (HOSPITAL_COMMUNITY): Payer: PRIVATE HEALTH INSURANCE

## 2011-10-28 ENCOUNTER — Encounter (HOSPITAL_COMMUNITY): Payer: PRIVATE HEALTH INSURANCE

## 2011-10-28 ENCOUNTER — Ambulatory Visit (HOSPITAL_COMMUNITY): Payer: PRIVATE HEALTH INSURANCE | Admitting: Physical Therapy

## 2011-10-28 LAB — GLUCOSE, CAPILLARY: Glucose-Capillary: 67 mg/dL — ABNORMAL LOW (ref 70–99)

## 2011-10-28 MED ORDER — SODIUM CHLORIDE 0.9 % IJ SOLN
10.0000 mL | Freq: Two times a day (BID) | INTRAMUSCULAR | Status: DC
  Administered 2011-10-28 – 2011-10-31 (×6): 10 mL
  Filled 2011-10-28: qty 6

## 2011-10-28 MED ORDER — SODIUM CHLORIDE 0.9 % IJ SOLN
10.0000 mL | INTRAMUSCULAR | Status: DC | PRN
  Administered 2011-10-30 – 2011-11-01 (×3): 10 mL
  Filled 2011-10-28 (×3): qty 3

## 2011-10-28 MED ORDER — ONDANSETRON HCL 4 MG/2ML IJ SOLN
INTRAMUSCULAR | Status: AC
  Administered 2011-10-28: 16:00:00
  Filled 2011-10-28: qty 2

## 2011-10-28 MED ORDER — ONDANSETRON HCL 4 MG/2ML IJ SOLN
4.0000 mg | INTRAMUSCULAR | Status: DC | PRN
  Administered 2011-10-30: 4 mg via INTRAVENOUS
  Filled 2011-10-28: qty 2

## 2011-10-28 MED ORDER — ENOXAPARIN SODIUM 80 MG/0.8ML ~~LOC~~ SOLN
0.5000 mg/kg | SUBCUTANEOUS | Status: DC
  Administered 2011-10-28: 75 mg via SUBCUTANEOUS
  Filled 2011-10-28: qty 0.8

## 2011-10-28 MED ORDER — SODIUM CHLORIDE 0.9 % IJ SOLN
INTRAMUSCULAR | Status: AC
  Filled 2011-10-28: qty 3

## 2011-10-28 MED ORDER — SODIUM CHLORIDE 0.9 % IJ SOLN
10.0000 mL | Freq: Two times a day (BID) | INTRAMUSCULAR | Status: DC
  Administered 2011-10-29 – 2011-10-31 (×5): 10 mL
  Filled 2011-10-28 (×3): qty 6
  Filled 2011-10-28 (×3): qty 3

## 2011-10-28 MED ORDER — SODIUM CHLORIDE 0.9 % IJ SOLN: 10.0000 mL | INTRAMUSCULAR | Status: DC | PRN

## 2011-10-28 NOTE — OR Nursing (Signed)
picc unable to thread to right arm. Left arm accessed with difficulty picc d/c per radiology recommendations. Christinia Gully RN informed will contact MD. Recommendation to [facility] for interventional radiology.

## 2011-10-28 NOTE — Progress Notes (Signed)
Subjective: She was admitted with cellulitis of the leg. She also has severe lymphedema of both legs the left is worse than the right. She says she feels a little bit better and has little but less pain in her leg. Her blood sugar has been doing fairly well  Objective: Vital signs in last 24 hours: Temp:  [98.5 F (36.9 C)-100 F (37.8 C)] 99 F (37.2 C) (11/08 0614) Pulse Rate:  [105-113] 105  (11/08 0614) Resp:  [20] 20  (11/08 0614) BP: (142-157)/(84-93) 142/84 mmHg (11/08 0614) SpO2:  [93 %-95 %] 95 % (11/08 0614) Weight change:  Last BM Date: 10/26/11  Intake/Output from previous day: 11/07 0701 - 11/08 0700 In: 3611.3 [P.O.:1320; I.V.:2041.3; IV Piggyback:250] Out: 600 [Urine:600]  PHYSICAL EXAM General appearance: alert, cooperative and morbidly obese Resp: clear to auscultation bilaterally Cardio: regular rate and rhythm, S1, S2 normal, no murmur, click, rub or gallop GI: soft, non-tender; bowel sounds normal; no masses,  no organomegaly Extremities: She has marked lymphedema of both legs. The left thigh is less inflamed and irritated than yesterday.  Lab Results:    Basic Metabolic Panel:  Basename 10/26/11 1831  NA 133*  K 3.6  CL 94*  CO2 29  GLUCOSE 291*  BUN 13  CREATININE 0.77  CALCIUM 9.8  MG --  PHOS --   Liver Function Tests:  [facility] 10/26/11 1831  AST 14  ALT 12  ALKPHOS 89  BILITOT 0.5  PROT 8.2  ALBUMIN 3.4*   No results found for this basename: LIPASE:2,AMYLASE:2 in the last 72 hours No results found for this basename: AMMONIA:2 in the last 72 hours CBC:  Basename 10/26/11 1831  WBC 17.2*  NEUTROABS 15.4*  HGB 13.3  HCT 40.6  MCV 88.1  PLT 332   Cardiac Enzymes: No results found for this basename: CKTOTAL:3,CKMB:3,CKMBINDEX:3,TROPONINI:3 in the last 72 hours BNP: No results found for this basename: POCBNP:3 in the last 72 hours D-Dimer:  [facility] 10/26/11 1831  DDIMER 0.56*   CBG:  Basename 10/28/11 0719 10/27/11  2108 10/27/11 1705 10/27/11 1107 10/27/11 0733 10/26/11 1949  GLUCAP 146* 81 285* 266* 256* 288*   Hemoglobin A1C: No results found for this basename: HGBA1C in the last 72 hours Fasting Lipid Panel: No results found for this basename: CHOL,HDL,LDLCALC,TRIG,CHOLHDL,LDLDIRECT in the last 72 hours Thyroid Function Tests: No results found for this basename: TSH,T4TOTAL,FREET4,T3FREE,THYROIDAB in the last 72 hours Anemia Panel: No results found for this basename: VITAMINB12,FOLATE,FERRITIN,TIBC,IRON,RETICCTPCT in the last 72 hours Coagulation: No results found for this basename: LABPROT:2,INR:2 in the last 72 hours Urine Drug Screen:  Alcohol Level: No results found for this basename: ETH:2 in the last 72 hours Urinalysis:  Misc. Labs:  ABGS No results found for this basename: PHART,PCO2,PO2ART,TCO2,HCO3 in the last 72 hours CULTURES Recent Results (from the past 240 hour(s))  CULTURE, BLOOD (ROUTINE X 2)     Status: Normal (Preliminary result)   Collection Time   10/26/11  6:31 PM      Component Value Range Status Comment   Specimen Description RIGHT ANTECUBITAL   Final    Special Requests BOTTLES DRAWN AEROBIC AND ANAEROBIC 6CC   Final    Culture NO GROWTH 2 DAYS   Final    Report Status PENDING   Incomplete   CULTURE, BLOOD (ROUTINE X 2)     Status: Normal (Preliminary result)   Collection Time   10/26/11  6:41 PM      Component Value Range Status Comment   Specimen  Description BLOOD RIGHT WRIST DRAWN BY RN   Final    Special Requests BOTTLES DRAWN AEROBIC ONLY 5CC   Final    Culture NO GROWTH 2 DAYS   Final    Report Status PENDING   Incomplete    Studies/Results: Dg Chest Portable 1 View  10/26/2011  *RADIOLOGY REPORT*  Clinical Data: Fever.  Cough.  PORTABLE CHEST - 1 VIEW  Comparison: 05/27/2010.  Findings: Cardiomegaly.  Central pulmonary vascular prominence.  No segmental infiltrate.  Limited evaluation of the mediastinum by technique.  The patient would eventually  benefit from follow-up two-view chest with cardiac leads removed.  No gross pneumothorax.  IMPRESSION: Cardiomegaly with central pulmonary vascular prominence.  Please see above.  Original Report Authenticated By: Doug Sou, M.D.    Medications:  Prior to Admission:  Prescriptions prior to admission  Medication Sig Dispense Refill  . insulin lispro (HUMALOG) 100 UNIT/ML injection Inject 60 Units into the skin 2 (two) times daily.        Marland Kitchen oxyCODONE-acetaminophen (PERCOCET) 5-325 MG per tablet Take 1 tablet by mouth 4 (four) times daily as needed. For pain       . potassium chloride SA (K-DUR,KLOR-CON) 20 MEQ tablet Take 20 mEq by mouth 3 (three) times daily.        Marland Kitchen torsemide (DEMADEX) 100 MG tablet Take 50 mg by mouth 2 (two) times daily.         Scheduled:   . enoxaparin (LOVENOX) injection  40 mg Subcutaneous Q24H  . insulin aspart  0-20 Units Subcutaneous TID WC  . insulin aspart  0-5 Units Subcutaneous QHS  . insulin aspart  40 Units Subcutaneous BID AC  . piperacillin-tazobactam (ZOSYN)  IV  3.375 g Intravenous Once  . piperacillin-tazobactam (ZOSYN)  IV  3.375 g Intravenous Q8H  . potassium chloride  20 mEq Oral TID  . torsemide  50 mg Oral BID  . vancomycin  1,250 mg Intravenous Q12H   Continuous:   . sodium chloride 75 mL/hr at 10/27/11 0039   YBR:[id number]  Assesment: She has cellulitis of the leg as mentioned. This is improved. I think she needs to have a PICC line because she's going to be on IV antibiotics probably for about 7-10 days Principal Problem:  *Cellulitis of left leg Active Problems:  Lymphedema of lower extremity  Diabetes mellitus  Morbid obesity    Plan: PICC line placement continue his IV antibiotics    LOS: 2 days   Cason Dabney L 10/28/2011, 8:38 AM

## 2011-10-29 ENCOUNTER — Ambulatory Visit (HOSPITAL_COMMUNITY)
Admit: 2011-10-29 | Discharge: 2011-10-29 | Disposition: A | Discharge status: Home / Self Care | Payer: PRIVATE HEALTH INSURANCE | Attending: Pulmonary Disease | Admitting: Pulmonary Disease

## 2011-10-29 LAB — GLUCOSE, CAPILLARY
Glucose-Capillary: 122 mg/dL — ABNORMAL HIGH (ref 70–99)
Glucose-Capillary: 225 mg/dL — ABNORMAL HIGH (ref 70–99)
Glucose-Capillary: 179 mg/dL — ABNORMAL HIGH (ref 70–99)

## 2011-10-29 LAB — BASIC METABOLIC PANEL
BUN: 18 mg/dL (ref 6–23)
CO2: 28 mEq/L (ref 19–32)
Chloride: 96 mEq/L (ref 96–112)
Creatinine, Ser: 1.61 mg/dL — ABNORMAL HIGH (ref 0.50–1.10)
GFR calc Af Amer: 44 mL/min — ABNORMAL LOW (ref >90)
Glucose, Bld: 243 mg/dL — ABNORMAL HIGH (ref 70–99)

## 2011-10-29 MED ORDER — VANCOMYCIN HCL 1000 MG IV SOLR
1500.0000 mg | INTRAVENOUS | Status: DC
  Administered 2011-10-30 – 2011-11-01 (×3): 1500 mg via INTRAVENOUS
  Filled 2011-10-29 (×4): qty 1500

## 2011-10-29 MED ORDER — ALPRAZOLAM 0.25 MG PO TABS
0.5000 mg | ORAL_TABLET | Freq: Once | ORAL | Status: AC
  Administered 2011-10-29: 0.5 mg via ORAL

## 2011-10-29 MED ORDER — ONDANSETRON HCL 4 MG PO TABS
4.0000 mg | ORAL_TABLET | Freq: Four times a day (QID) | ORAL | Status: DC | PRN
  Administered 2011-10-29: 4 mg via ORAL
  Filled 2011-10-29: qty 1

## 2011-10-29 MED ORDER — ENOXAPARIN SODIUM 80 MG/0.8ML ~~LOC~~ SOLN
0.5000 mg/kg | SUBCUTANEOUS | Status: DC
  Administered 2011-10-29 – 2011-11-01 (×4): 75 mg via SUBCUTANEOUS
  Filled 2011-10-29 (×4): qty 0.8

## 2011-10-29 NOTE — Progress Notes (Signed)
Subjective: She is awake and alert. We attempted [facility] line placement yesterday but were unsuccessful. She is going to be sent to the interventional radiologist at [facility] today for that. She says she feels better. She still has some diffuse myalgias  Objective: Vital signs in last 24 hours: Temp:  [99.9 F (37.7 C)-100.6 F (38.1 C)] 99.9 F (37.7 C) (11/09 0551) Pulse Rate:  [91-119] 91  (11/09 0551) Resp:  [20-24] 24  (11/09 0551) BP: (146-168)/(82-100) 168/100 mmHg (11/09 0551) SpO2:  [91 %-94 %] 91 % (11/09 0551) Weight change:  Last BM Date: 10/26/11  Intake/Output from previous day: 11/08 0701 - 11/09 0700 In: 1955 [I.V.:1955] Out: -   PHYSICAL EXAM General appearance: alert, cooperative, mild distress and morbidly obese Resp: clear to auscultation bilaterally Cardio: regular rate and rhythm, S1, S2 normal, no murmur, click, rub or gallop GI: soft, non-tender; bowel sounds normal; no masses,  no organomegaly Extremities: Marked lymphedema bilaterally left greater than right but her leg is less swollen and tender  Lab Results:    Basic Metabolic Panel:  Basename 10/29/11 0442 10/26/11 1831  NA 133* 133*  K 4.0 3.6  CL 96 94*  CO2 28 29  GLUCOSE 243* 291*  BUN 18 13  CREATININE 1.61* 0.77  CALCIUM 8.3* 9.8  MG -- --  PHOS -- --   Liver Function Tests:  [facility] 10/26/11 1831  AST 14  ALT 12  ALKPHOS 89  BILITOT 0.5  PROT 8.2  ALBUMIN 3.4*   No results found for this basename: LIPASE:2,AMYLASE:2 in the last 72 hours No results found for this basename: AMMONIA:2 in the last 72 hours CBC:  Basename 10/26/11 1831  WBC 17.2*  NEUTROABS 15.4*  HGB 13.3  HCT 40.6  MCV 88.1  PLT 332   Cardiac Enzymes: No results found for this basename: CKTOTAL:3,CKMB:3,CKMBINDEX:3,TROPONINI:3 in the last 72 hours BNP: No results found for this basename: POCBNP:3 in the last 72 hours D-Dimer:  [facility] 10/26/11 1831  DDIMER 0.56*   CBG:  Basename  10/29/11 0745 10/28/11 2102 10/28/11 1402 10/28/11 1213 10/28/11 0719 10/27/11 2108  GLUCAP 225* 181* 111* 67* 146* 81   Hemoglobin A1C: No results found for this basename: HGBA1C in the last 72 hours Fasting Lipid Panel: No results found for this basename: CHOL,HDL,LDLCALC,TRIG,CHOLHDL,LDLDIRECT in the last 72 hours Thyroid Function Tests: No results found for this basename: TSH,T4TOTAL,FREET4,T3FREE,THYROIDAB in the last 72 hours Anemia Panel: No results found for this basename: VITAMINB12,FOLATE,FERRITIN,TIBC,IRON,RETICCTPCT in the last 72 hours Coagulation: No results found for this basename: LABPROT:2,INR:2 in the last 72 hours Urine Drug Screen:  Alcohol Level: No results found for this basename: ETH:2 in the last 72 hours Urinalysis:  Misc. Labs:  ABGS No results found for this basename: PHART,PCO2,PO2ART,TCO2,HCO3 in the last 72 hours CULTURES Recent Results (from the past 240 hour(s))  CULTURE, BLOOD (ROUTINE X 2)     Status: Normal (Preliminary result)   Collection Time   10/26/11  6:31 PM      Component Value Range Status Comment   Specimen Description RIGHT ANTECUBITAL   Final    Special Requests BOTTLES DRAWN AEROBIC AND ANAEROBIC 6CC   Final    Culture NO GROWTH 2 DAYS   Final    Report Status PENDING   Incomplete   CULTURE, BLOOD (ROUTINE X 2)     Status: Normal (Preliminary result)   Collection Time   10/26/11  6:41 PM      Component Value Range Status  Comment   Specimen Description BLOOD RIGHT WRIST DRAWN BY RN   Final    Special Requests BOTTLES DRAWN AEROBIC ONLY 5CC   Final    Culture NO GROWTH 2 DAYS   Final    Report Status PENDING   Incomplete    Studies/Results: Dg Chest 2 View  10/28/2011  *RADIOLOGY REPORT*  Clinical Data: PICC line placement  CHEST - 2 VIEW  Comparison: 10/28/2011  Findings: Left arm PICC line again identified, extending right lateral to mediastinum on PA view. On the lateral view, this appears to be anteriorly directed, likely  within a mediastinal draining vein. This should be repositioned. Cardiac silhouette appears enlarged. Mediastinal contours and pulmonary vascularity grossly normal. Linear subsegmental atelectasis or scarring left lower lobe. Lungs otherwise clear.  IMPRESSION: Left arm PICC line tip appears external to the mediastinum, suspect within and mediastinal draining vein. Recommend repositioning or replacement.  Original Report Authenticated By: Burnetta Sabin, M.D.   Dg Chest Portable 1 View  10/28/2011  *RADIOLOGY REPORT*  Clinical Data: Left PICC line placement.  PORTABLE CHEST - 1 VIEW  Comparison: 10/26/2011  Findings: Left PICC line is in place.  The tip projects over the medial right lung and appears to be beyond the SVC margin.  I cannot exclude that this is extravascular.  Mild cardiomegaly.  Bibasilar atelectasis.  No effusions.  IMPRESSION: Left PICC line tip projects in beyond the SVC margin over the medial right lung.  Cannot exclude this is extravascular.  Consider replacement.  Original Report Authenticated By: Raelyn Number, M.D.    Medications:  Prior to Admission:  Prescriptions prior to admission  Medication Sig Dispense Refill  . insulin lispro (HUMALOG) 100 UNIT/ML injection Inject 60 Units into the skin 2 (two) times daily.        Marland Kitchen oxyCODONE-acetaminophen (PERCOCET) 5-325 MG per tablet Take 1 tablet by mouth 4 (four) times daily as needed. For pain       . potassium chloride SA (K-DUR,KLOR-CON) 20 MEQ tablet Take 20 mEq by mouth 3 (three) times daily.        Marland Kitchen torsemide (DEMADEX) 100 MG tablet Take 50 mg by mouth 2 (two) times daily.         Scheduled:   . enoxaparin (LOVENOX) injection  0.5 mg/kg Subcutaneous Q24H  . insulin aspart  0-20 Units Subcutaneous TID WC  . insulin aspart  0-5 Units Subcutaneous QHS  . insulin aspart  40 Units Subcutaneous BID AC  . ondansetron      . piperacillin-tazobactam (ZOSYN)  IV  3.375 g Intravenous Q8H  . potassium chloride  20 mEq Oral TID    . sodium chloride  10 mL Intracatheter Q12H  . sodium chloride  10 mL Intracatheter Q12H  . sodium chloride      . torsemide  50 mg Oral BID  . vancomycin  1,250 mg Intravenous Q12H  . DISCONTD: enoxaparin (LOVENOX) injection  40 mg Subcutaneous Q24H  . DISCONTD: enoxaparin (LOVENOX) injection  0.5 mg/kg Subcutaneous Q24H   Continuous:   . sodium chloride 75 mL/hr at 10/28/11 1035   EQA:[id number], oxyCODONE-acetaminophen, sodium chloride, sodium chloride  Assesment: She has cellulitis of the left leg. It does seem to be improving. She has very poor venous access. She has lymphedema of her lower extremities left greater than right. She has morbid obesity. We were unable to place a PICC line. She is mildly hyponatremic but I don't think she needs treatment for that. Principal Problem:  *  Cellulitis of left leg Active Problems:  Lymphedema of lower extremity  Diabetes mellitus  Morbid obesity    Plan: She's going to be sent to the interventional radiologist to see if we can get a PICC line placed and then discussion about what to do with her IV antibiotics whether she wants to do this at home or at a outpatient clinic or facility    LOS: 3 days   Baylen Dea L 10/29/2011, 8:49 AM

## 2011-10-29 NOTE — Consult Note (Signed)
ANTIBIOTIC CONSULT NOTE - follow up  Pharmacy Consult for vancomycin and zosyn Indication: cellulitis  Allergies  Allergen Reactions  . Other     Pneumonia Vaccine   Patient Measurements: Height: 5' 5" (165.1 cm) Weight: 330 lb (149.687 kg) IBW/kg (Calculated) : 57   Vital Signs: Temp: 99.9 F (37.7 C) (11/09 0551) Temp src: Oral (11/09 0551) BP: 168/100 mmHg (11/09 0551) Pulse Rate: 91  (11/09 0551) Intake/Output from previous day: 11/08 0701 - 11/09 0700 In: 1955 [I.V.:1955] Out: -  Intake/Output from this shift:    Labs:  Basename 10/29/11 0442 10/26/11 1831  WBC -- 17.2*  HGB -- 13.3  PLT -- 332  LABCREA -- --  CREATININE 1.61* 0.77   Estimated Creatinine Clearance: 65.6 ml/min (by C-G formula based on Cr of 1.61).  Basename 10/29/11 0832  VANCOTROUGH >25.0*  VANCOPEAK --  [name] --  GENTTROUGH --  GENTPEAK --  GENTRANDOM --  TOBRATROUGH --  TOBRAPEAK --  TOBRARND --  AMIKACINPEAK --  AMIKACINTROU --  AMIKACIN --   Microbiology: Recent Results (from the past 720 hour(s))  CULTURE, BLOOD (ROUTINE X 2)     Status: Normal (Preliminary result)   Collection Time   10/26/11  6:31 PM      Component Value Range Status Comment   Specimen Description RIGHT ANTECUBITAL   Final    Special Requests BOTTLES DRAWN AEROBIC AND ANAEROBIC 6CC   Final    Culture NO GROWTH 2 DAYS   Final    Report Status PENDING   Incomplete   CULTURE, BLOOD (ROUTINE X 2)     Status: Normal (Preliminary result)   Collection Time   10/26/11  6:41 PM      Component Value Range Status Comment   Specimen Description BLOOD RIGHT WRIST DRAWN BY RN   Final    Special Requests BOTTLES DRAWN AEROBIC ONLY 5CC   Final    Culture NO GROWTH 2 DAYS   Final    Report Status PENDING   Incomplete    Medical History: Past Medical History  Diagnosis Date  . Diabetes mellitus   . Chronic pain   . Lymph edema    Medications:  Scheduled:     . enoxaparin (LOVENOX) injection  0.5 mg/kg  Subcutaneous Q24H  . insulin aspart  0-20 Units Subcutaneous TID WC  . insulin aspart  0-5 Units Subcutaneous QHS  . insulin aspart  40 Units Subcutaneous BID AC  . ondansetron      . piperacillin-tazobactam (ZOSYN)  IV  3.375 g Intravenous Q8H  . potassium chloride  20 mEq Oral TID  . sodium chloride  10 mL Intracatheter Q12H  . sodium chloride  10 mL Intracatheter Q12H  . sodium chloride      . torsemide  50 mg Oral BID  . DISCONTD: enoxaparin (LOVENOX) injection  40 mg Subcutaneous Q24H  . DISCONTD: enoxaparin (LOVENOX) injection  0.5 mg/kg Subcutaneous Q24H  . DISCONTD: vancomycin  1,250 mg Intravenous Q12H   Assessment: Obese SCr rising quickly (? Vancomycin induced) ClCr still OK Vancomycin trough level above goal (due to SCr rising most likely)  Goal of Therapy:  Vancomycin trough level 10-15 mcg/ml  Plan: Zosyn 3.375gm iv q8hrs Vancomycin 1548m iv q24hrs (dose adjusted 11/9) F/U trough at steady state Monitor renal fxn, SCr daily for now Labs per protocol  HHart RobinsonsA 10/29/2011,10:26 AM

## 2011-10-29 NOTE — Procedures (Signed)
Placement of right arm PICC.  Tip in SVC.  Length = 41 cm.

## 2011-10-29 NOTE — Progress Notes (Signed)
PT plans to return home with her family. PT getting picc iv line today. Pt to have advance home care  For iv antibiotics at d/c. Discussed with pt and Vaughan Basta from [facility].

## 2011-10-30 LAB — GLUCOSE, CAPILLARY
Glucose-Capillary: 136 mg/dL — ABNORMAL HIGH (ref 70–99)
Glucose-Capillary: 143 mg/dL — ABNORMAL HIGH (ref 70–99)
Glucose-Capillary: 136 mg/dL — ABNORMAL HIGH (ref 70–99)

## 2011-10-30 LAB — BASIC METABOLIC PANEL
BUN: 17 mg/dL (ref 6–23)
CO2: 28 mEq/L (ref 19–32)
Chloride: 98 mEq/L (ref 96–112)
Creatinine, Ser: 1.58 mg/dL — ABNORMAL HIGH (ref 0.50–1.10)
Glucose, Bld: 124 mg/dL — ABNORMAL HIGH (ref 70–99)
Potassium: 4.3 mEq/L (ref 3.5–5.1)

## 2011-10-30 NOTE — Progress Notes (Signed)
NAMEMARILYNE, [name]              ACCOUNT NO.:  [id number]  MEDICAL RECORD NO.:  [id number]  LOCATION:  A304                          FACILITY:  [facility]  PHYSICIAN:  Zai Chmiel L. [name], M.D.DATE OF BIRTH:  [birth year]  DATE OF PROCEDURE: DATE OF DISCHARGE:                                PROGRESS NOTE   [name] is admitted with cellulitis of the leg, and she has marked lymphedema of the legs.  She got a PICC line placed yesterday.  She says her legs are little worse today.  Her physical examination shows that she is awake and alert.  She is morbidly obese.  She has marked lymphedema of both legs, left is worse than the right, and her legs are little more erythematous and inflamed today.  My assessment then is that she has cellulitis of the legs.  She has lymphedema.  She has diabetes, which has been pretty well controlled.  In my plan, I had anticipated that she might be able to go home today, but since her legs are worse, I am going to hold off on that.  Continue with all of her other treatments, and probably discharge tomorrow since she has had a little more trouble.     Arjen Deringer L. [name], M.D.     ELH/MEDQ  D:  10/30/2011  T:  10/30/2011  Job:  [id number]

## 2011-10-30 NOTE — Progress Notes (Signed)
Pharmacist on-call informed of vanc level of 37.5,orders to follow.

## 2011-10-31 LAB — BASIC METABOLIC PANEL
BUN: 16 mg/dL (ref 6–23)
Chloride: 98 mEq/L (ref 96–112)
Creatinine, Ser: 1.48 mg/dL — ABNORMAL HIGH (ref 0.50–1.10)
GFR calc Af Amer: 48 mL/min — ABNORMAL LOW (ref >90)
GFR calc non Af Amer: 42 mL/min — ABNORMAL LOW (ref >90)
Glucose, Bld: 182 mg/dL — ABNORMAL HIGH (ref 70–99)
Potassium: 4.6 mEq/L (ref 3.5–5.1)

## 2011-10-31 LAB — CULTURE, BLOOD (ROUTINE X 2): Culture: NO GROWTH

## 2011-10-31 LAB — GLUCOSE, CAPILLARY
Glucose-Capillary: 182 mg/dL — ABNORMAL HIGH (ref 70–99)
Glucose-Capillary: 211 mg/dL — ABNORMAL HIGH (ref 70–99)
Glucose-Capillary: 276 mg/dL — ABNORMAL HIGH (ref 70–99)
Glucose-Capillary: 141 mg/dL — ABNORMAL HIGH (ref 70–99)

## 2011-10-31 MED ORDER — FUROSEMIDE 10 MG/ML IJ SOLN
40.0000 mg | Freq: Two times a day (BID) | INTRAMUSCULAR | Status: DC
  Administered 2011-10-31 (×2): 40 mg via INTRAVENOUS
  Filled 2011-10-31 (×2): qty 4

## 2011-10-31 NOTE — Progress Notes (Signed)
Subjective: She has more pain today. It seems like her leg is a little more swollen. She has no other new complaints.  Objective: Vital signs in last 24 hours: Temp:  [98.3 F (36.8 C)-98.8 F (37.1 C)] 98.8 F (37.1 C) (11/11 0523) Pulse Rate:  [94-97] 94  (11/11 0523) Resp:  [20] 20  (11/11 0523) BP: (115-174)/(75-89) 147/89 mmHg (11/11 0523) SpO2:  [91 %-94 %] 91 % (11/11 0523) Weight change:  Last BM Date: 10/31/11  Intake/Output from previous day: 11/10 0701 - 11/11 0700 In: 2216.3 [P.O.:240; I.V.:1826.3; IV Piggyback:150] Out: -   PHYSICAL EXAM General appearance: alert, cooperative and mild distress Resp: clear to auscultation bilaterally Cardio: regular rate and rhythm, S1, S2 normal, no murmur, click, rub or gallop GI: soft, non-tender; bowel sounds normal; no masses,  no organomegaly Extremities: Marked bilateral lymphedema and erythema and tenderness in the left thigh  Lab Results:    Basic Metabolic Panel:  Basename 10/31/11 0547 10/30/11 0557  NA 134* 135  K 4.6 4.3  CL 98 98  CO2 29 28  GLUCOSE 182* 124*  BUN 16 17  CREATININE 1.48* 1.58*  CALCIUM 8.7 8.2*  MG -- --  PHOS -- --   Liver Function Tests: No results found for this basename: AST:2,ALT:2,ALKPHOS:2,BILITOT:2,PROT:2,ALBUMIN:2 in the last 72 hours No results found for this basename: LIPASE:2,AMYLASE:2 in the last 72 hours No results found for this basename: AMMONIA:2 in the last 72 hours CBC: No results found for this basename: WBC:2,NEUTROABS:2,HGB:2,HCT:2,MCV:2,PLT:2 in the last 72 hours Cardiac Enzymes: No results found for this basename: CKTOTAL:3,CKMB:3,CKMBINDEX:3,TROPONINI:3 in the last 72 hours BNP: No results found for this basename: POCBNP:3 in the last 72 hours D-Dimer: No results found for this basename: DDIMER:2 in the last 72 hours CBG:  Basename 10/31/11 0741 10/30/11 2053 10/30/11 1645 10/30/11 1103 10/30/11 0734 10/29/11 2031  GLUCAP 182* 90 136* 143* 136* 80    Hemoglobin A1C: No results found for this basename: HGBA1C in the last 72 hours Fasting Lipid Panel: No results found for this basename: CHOL,HDL,LDLCALC,TRIG,CHOLHDL,LDLDIRECT in the last 72 hours Thyroid Function Tests: No results found for this basename: TSH,T4TOTAL,FREET4,T3FREE,THYROIDAB in the last 72 hours Anemia Panel: No results found for this basename: VITAMINB12,FOLATE,FERRITIN,TIBC,IRON,RETICCTPCT in the last 72 hours Coagulation: No results found for this basename: LABPROT:2,INR:2 in the last 72 hours Urine Drug Screen:  Alcohol Level: No results found for this basename: ETH:2 in the last 72 hours Urinalysis:  Misc. Labs:  ABGS No results found for this basename: PHART,PCO2,PO2ART,TCO2,HCO3 in the last 72 hours CULTURES Recent Results (from the past 240 hour(s))  CULTURE, BLOOD (ROUTINE X 2)     Status: Normal   Collection Time   10/26/11  6:31 PM      Component Value Range Status Comment   Specimen Description RIGHT ANTECUBITAL   Final    Special Requests BOTTLES DRAWN AEROBIC AND ANAEROBIC [place]   Final    Culture NO GROWTH 5 DAYS   Final    Report Status 10/31/2011 FINAL   Final   CULTURE, BLOOD (ROUTINE X 2)     Status: Normal   Collection Time   10/26/11  6:41 PM      Component Value Range Status Comment   Specimen Description BLOOD RIGHT WRIST DRAWN BY RN   Final    Special Requests BOTTLES DRAWN AEROBIC ONLY 5CC   Final    Culture NO GROWTH 5 DAYS   Final    Report Status 10/31/2011 FINAL   Final  Studies/Results: Ir US Guide Vasc Access Right  10/29/2011  *RADIOLOGY REPORT*  Clinical Data: Cellulitis and needs IV access.  PICC LINE PLACEMENT WITH ULTRASOUND AND FLUOROSCOPIC  GUIDANCE  Fluoroscopy Time: 0.3 minutes.  The right arm was prepped with chlorhexidine, draped in the usual sterile fashion using maximum barrier technique (cap and mask, sterile gown, sterile gloves, large sterile sheet, hand hygiene and cutaneous antisepsis) and infiltrated  locally with 1% Lidocaine.  Ultrasound demonstrated patency of the right basilic vein, and this was documented with an image.  Under real-time ultrasound guidance, this vein was accessed with a 21 gauge micropuncture needle and image documentation was performed.  The needle was exchanged over a guidewire for a peel-away sheath through which a five [place] dual lumen PICC trimmed to 41 cm was advanced, positioned with its tip at the lower SVC.  Fluoroscopy during the procedure and fluoro spot radiograph confirms appropriate catheter position.  The catheter was flushed, secured to the skin with Prolene sutures, and covered with a sterile dressing.  Complications:  None  IMPRESSION: Successful right arm PICC line placement with ultrasound and fluoroscopic guidance.  The catheter is ready for use.  Original Report Authenticated By: Markus Daft, M.D.   Ir Central Line Left  10/29/2011  *RADIOLOGY REPORT*  Clinical Data: Cellulitis and needs IV access.  PICC LINE PLACEMENT WITH ULTRASOUND AND FLUOROSCOPIC  GUIDANCE  Fluoroscopy Time: 0.3 minutes.  The right arm was prepped with chlorhexidine, draped in the usual sterile fashion using maximum barrier technique (cap and mask, sterile gown, sterile gloves, large sterile sheet, hand hygiene and cutaneous antisepsis) and infiltrated locally with 1% Lidocaine.  Ultrasound demonstrated patency of the right basilic vein, and this was documented with an image.  Under real-time ultrasound guidance, this vein was accessed with a 21 gauge micropuncture needle and image documentation was performed.  The needle was exchanged over a guidewire for a peel-away sheath through which a five [place] dual lumen PICC trimmed to 41 cm was advanced, positioned with its tip at the lower SVC.  Fluoroscopy during the procedure and fluoro spot radiograph confirms appropriate catheter position.  The catheter was flushed, secured to the skin with Prolene sutures, and covered with a sterile dressing.   Complications:  None  IMPRESSION: Successful right arm PICC line placement with ultrasound and fluoroscopic guidance.  The catheter is ready for use.  Original Report Authenticated By: Markus Daft, M.D.    Medications:  Scheduled:   . enoxaparin (LOVENOX) injection  0.5 mg/kg Subcutaneous Q24H  . furosemide  40 mg Intravenous Q12H  . insulin aspart  0-20 Units Subcutaneous TID WC  . insulin aspart  0-5 Units Subcutaneous QHS  . insulin aspart  40 Units Subcutaneous BID AC  . piperacillin-tazobactam (ZOSYN)  IV  3.375 g Intravenous Q8H  . potassium chloride  20 mEq Oral TID  . sodium chloride  10 mL Intracatheter Q12H  . sodium chloride  10 mL Intracatheter Q12H  . vancomycin  1,500 mg Intravenous Q24H  . DISCONTD: torsemide  50 mg Oral BID   Continuous:   . sodium chloride 75 mL/hr at 10/31/11 0252   BRK:[id number], ondansetron, oxyCODONE-acetaminophen, sodium chloride, sodium chloride  Assesment: She has cellulitis of the leg which is complicating her chronic lymphedema. She's having more trouble swallowing not going to discharge her home although that was my initial plan for today Principal Problem:  *Cellulitis of left leg Active Problems:  Lymphedema of lower extremity  Diabetes mellitus  Morbid obesity  Plan: She will continue with her IV antibiotics. I did not change any other treatments.    LOS: 5 days   [name] 10/31/2011, 9:45 AM

## 2011-11-01 LAB — GLUCOSE, CAPILLARY: Glucose-Capillary: 159 mg/dL — ABNORMAL HIGH (ref 70–99)

## 2011-11-01 LAB — BASIC METABOLIC PANEL
BUN: 14 mg/dL (ref 6–23)
CO2: 28 mEq/L (ref 19–32)
Calcium: 9 mg/dL (ref 8.4–10.5)
Glucose, Bld: 182 mg/dL — ABNORMAL HIGH (ref 70–99)
Sodium: 135 mEq/L (ref 135–145)

## 2011-11-01 MED ORDER — HEPARIN SOD (PORK) LOCK FLUSH 100 UNIT/ML IV SOLN
250.0000 [IU] | Freq: Once | INTRAVENOUS | Status: AC
  Administered 2011-11-01: 14:00:00 via INTRAVENOUS
  Filled 2011-11-01: qty 3

## 2011-11-01 MED ORDER — HEPARIN SOD (PORK) LOCK FLUSH 100 UNIT/ML IV SOLN: 250.0000 [IU] | INTRAVENOUS | Status: DC | PRN

## 2011-11-01 MED ORDER — DEXTROSE 5 % IV SOLN: 1.0000 g | INTRAVENOUS | Status: DC

## 2011-11-01 MED ORDER — HEPARIN SOD (PORK) LOCK FLUSH 100 UNIT/ML IV SOLN: 250.0000 [IU] | Freq: Once | INTRAVENOUS | Status: DC

## 2011-11-01 MED ORDER — VANCOMYCIN HCL 1000 MG IV SOLR: 1500.0000 mg | Freq: Two times a day (BID) | INTRAVENOUS | Status: DC

## 2011-11-01 MED ORDER — DEXTROSE 5 % IV SOLN
1.0000 g | INTRAVENOUS | Status: DC
  Administered 2011-11-01: 1 g via INTRAVENOUS
  Filled 2011-11-01 (×2): qty 10

## 2011-11-01 MED ORDER — VANCOMYCIN HCL 1000 MG IV SOLR: 1500.0000 mg | INTRAVENOUS | Status: DC

## 2011-11-01 NOTE — Consult Note (Signed)
ANTIBIOTIC CONSULT NOTE - follow up  Pharmacy Consult for vancomycin and zosyn Indication: cellulitis  Allergies  Allergen Reactions  . Other     Pneumonia Vaccine   Patient Measurements: Height: _0  (165.1 cm) Weight: 330 lb (149.687 kg) IBW/kg (Calculated) : 57   Vital Signs: Temp: 98.7 F (37.1 C) (11/12 0524) Temp src: Oral (11/11 2116) BP: 166/79 mmHg (11/12 0524) Pulse Rate: 93  (11/12 0524) Intake/Output from previous day: 11/11 0701 - 11/12 0700 In: 2205 [P.O.:240; I.V.:1815; IV Piggyback:150] Out: 900 [Urine:900] Intake/Output from this shift:    Labs:  Basename 11/01/11 0636 10/31/11 0547 10/30/11 0557  WBC -- -- --  HGB -- -- --  PLT -- -- --  LABCREA -- -- --  CREATININE 1.32* 1.48* 1.58*   Estimated Creatinine Clearance: 80 ml/min (by C-G formula based on Cr of 1.32).  Basename 11/01/11 0636 10/29/11 0832  VANCOTROUGH 11.4 37.6*  VANCOPEAK -- --  [name] -- --  GENTTROUGH -- --  GENTPEAK -- --  GENTRANDOM -- --  TOBRATROUGH -- --  TOBRAPEAK -- --  TOBRARND -- --  AMIKACINPEAK -- --  AMIKACINTROU -- --  AMIKACIN -- --   Microbiology: Recent Results (from the past 720 hour(s))  CULTURE, BLOOD (ROUTINE X 2)     Status: Normal   Collection Time   10/26/11  6:31 PM      Component Value Range Status Comment   Specimen Description RIGHT ANTECUBITAL   Final    Special Requests BOTTLES DRAWN AEROBIC AND ANAEROBIC [place]   Final    Culture NO GROWTH 5 DAYS   Final    Report Status 10/31/2011 FINAL   Final   CULTURE, BLOOD (ROUTINE X 2)     Status: Normal   Collection Time   10/26/11  6:41 PM      Component Value Range Status Comment   Specimen Description BLOOD RIGHT WRIST DRAWN BY RN   Final    Special Requests BOTTLES DRAWN AEROBIC ONLY 5CC   Final    Culture NO GROWTH 5 DAYS   Final    Report Status 10/31/2011 FINAL   Final    Medical History: Past Medical History  Diagnosis Date  . Diabetes mellitus   . Chronic pain   . Lymph edema     Medications:  Scheduled:     . cefTRIAXone (ROCEPHIN) IV  1 g Intravenous Q24H  . enoxaparin (LOVENOX) injection  0.5 mg/kg Subcutaneous Q24H  . insulin aspart  0-20 Units Subcutaneous TID WC  . insulin aspart  0-5 Units Subcutaneous QHS  . insulin aspart  40 Units Subcutaneous BID AC  . potassium chloride  20 mEq Oral TID  . sodium chloride  10 mL Intracatheter Q12H  . sodium chloride  10 mL Intracatheter Q12H  . vancomycin  1,500 mg Intravenous Q24H  . DISCONTD: furosemide  40 mg Intravenous Q12H  . DISCONTD: piperacillin-tazobactam (ZOSYN)  IV  3.375 g Intravenous Q8H  . DISCONTD: torsemide  50 mg Oral BID   Assessment: Obese Renal function parameters continue to improve. Vancomycin trough within target for cellulitis.  Goal of Therapy:  Vancomycin trough level 10-15 mcg/ml  Plan: Zosyn 3.375gm iv q8hrs Vancomycin 1512m iv q24hrs (dose adjusted 11/9) Monitor renal fxn, SCr daily for now Labs per protocol  VBriscoe [name] [name] 11/01/2011,8:19 AM

## 2011-11-01 NOTE — Discharge Summary (Signed)
Physician Discharge Summary  Patient ID: [name] MRN: [id number] DOB/AGE: [birth year] 45 y.o. Primary Care Physician:Fawna Cranmer L, MD Admit date: 10/26/2011 Discharge date: 11/01/2011    Discharge Diagnoses:   Principal Problem:  *Cellulitis of left leg Active Problems:  Lymphedema of lower extremity  Diabetes mellitus  Morbid obesity   Current Discharge Medication List    START taking these medications   Details  dextrose 5 % SOLN 50 mL with cefTRIAXone 1 G SOLR 1 g Inject 1 g into the vein daily. Qty: 1000 mg, Refills: 10    heparin lock flush 100 UNIT/ML SOLN 2.5 mLs (250 Units total) by Intracatheter route once. Qty: 250 vial, Refills: 0    sodium chloride 0.9 % SOLN 500 mL with vancomycin 1000 MG SOLR 1,500 mg Inject 1,500 mg into the vein daily. Qty: 1500 mg, Refills: 10      CONTINUE these medications which have NOT CHANGED   Details  insulin lispro (HUMALOG) 100 UNIT/ML injection Inject 60 Units into the skin 2 (two) times daily.      oxyCODONE-acetaminophen (PERCOCET) 5-325 MG per tablet Take 1 tablet by mouth 4 (four) times daily as needed. For pain     potassium chloride SA (K-DUR,KLOR-CON) 20 MEQ tablet Take 20 mEq by mouth 3 (three) times daily.      torsemide (DEMADEX) 100 MG tablet Take 50 mg by mouth 2 (two) times daily.          Discharged Condition: Improved    Consults: Radiology for PICC line  Significant Diagnostic Studies: Dg Chest 2 View  10/28/2011  *RADIOLOGY REPORT*  Clinical Data: PICC line placement  CHEST - 2 VIEW  Comparison: 10/28/2011  Findings: Left arm PICC line again identified, extending right lateral to mediastinum on PA view. On the lateral view, this appears to be anteriorly directed, likely within a mediastinal draining vein. This should be repositioned. Cardiac silhouette appears enlarged. Mediastinal contours and pulmonary vascularity grossly normal. Linear subsegmental atelectasis or scarring left lower lobe.  Lungs otherwise clear.  IMPRESSION: Left arm PICC line tip appears external to the mediastinum, suspect within and mediastinal draining vein. Recommend repositioning or replacement.  Original Report Authenticated By: Burnetta Sabin, M.D.   Ir US Guide Vasc Access Right  10/29/2011  *RADIOLOGY REPORT*  Clinical Data: Cellulitis and needs IV access.  PICC LINE PLACEMENT WITH ULTRASOUND AND FLUOROSCOPIC  GUIDANCE  Fluoroscopy Time: 0.3 minutes.  The right arm was prepped with chlorhexidine, draped in the usual sterile fashion using maximum barrier technique (cap and mask, sterile gown, sterile gloves, large sterile sheet, hand hygiene and cutaneous antisepsis) and infiltrated locally with 1% Lidocaine.  Ultrasound demonstrated patency of the right basilic vein, and this was documented with an image.  Under real-time ultrasound guidance, this vein was accessed with a 21 gauge micropuncture needle and image documentation was performed.  The needle was exchanged over a guidewire for a peel-away sheath through which a five [place] dual lumen PICC trimmed to 41 cm was advanced, positioned with its tip at the lower SVC.  Fluoroscopy during the procedure and fluoro spot radiograph confirms appropriate catheter position.  The catheter was flushed, secured to the skin with Prolene sutures, and covered with a sterile dressing.  Complications:  None  IMPRESSION: Successful right arm PICC line placement with ultrasound and fluoroscopic guidance.  The catheter is ready for use.  Original Report Authenticated By: Markus Daft, M.D.   Dg Chest Portable 1 View  10/28/2011  *RADIOLOGY REPORT*  Clinical Data: Left PICC line placement.  PORTABLE CHEST - 1 VIEW  Comparison: 10/26/2011  Findings: Left PICC line is in place.  The tip projects over the medial right lung and appears to be beyond the SVC margin.  I cannot exclude that this is extravascular.  Mild cardiomegaly.  Bibasilar atelectasis.  No effusions.  IMPRESSION: Left PICC line  tip projects in beyond the SVC margin over the medial right lung.  Cannot exclude this is extravascular.  Consider replacement.  Original Report Authenticated By: Raelyn Number, M.D.   Dg Chest Portable 1 View  10/26/2011  *RADIOLOGY REPORT*  Clinical Data: Fever.  Cough.  PORTABLE CHEST - 1 VIEW  Comparison: 05/27/2010.  Findings: Cardiomegaly.  Central pulmonary vascular prominence.  No segmental infiltrate.  Limited evaluation of the mediastinum by technique.  The patient would eventually benefit from follow-up two-view chest with cardiac leads removed.  No gross pneumothorax.  IMPRESSION: Cardiomegaly with central pulmonary vascular prominence.  Please see above.  Original Report Authenticated By: Doug Sou, M.D.   Ir Central Line Left  10/29/2011  *RADIOLOGY REPORT*  Clinical Data: Cellulitis and needs IV access.  PICC LINE PLACEMENT WITH ULTRASOUND AND FLUOROSCOPIC  GUIDANCE  Fluoroscopy Time: 0.3 minutes.  The right arm was prepped with chlorhexidine, draped in the usual sterile fashion using maximum barrier technique (cap and mask, sterile gown, sterile gloves, large sterile sheet, hand hygiene and cutaneous antisepsis) and infiltrated locally with 1% Lidocaine.  Ultrasound demonstrated patency of the right basilic vein, and this was documented with an image.  Under real-time ultrasound guidance, this vein was accessed with a 21 gauge micropuncture needle and image documentation was performed.  The needle was exchanged over a guidewire for a peel-away sheath through which a five [place] dual lumen PICC trimmed to 41 cm was advanced, positioned with its tip at the lower SVC.  Fluoroscopy during the procedure and fluoro spot radiograph confirms appropriate catheter position.  The catheter was flushed, secured to the skin with Prolene sutures, and covered with a sterile dressing.  Complications:  None  IMPRESSION: Successful right arm PICC line placement with ultrasound and fluoroscopic guidance.   The catheter is ready for use.  Original Report Authenticated By: Markus Daft, M.D.    Lab Results: Results for orders placed during the hospital encounter of 10/26/11 (from the past 48 hour(s))  GLUCOSE, CAPILLARY     Status: Abnormal   Collection Time   10/30/11 11:03 AM      Component Value Range Comment   Glucose-Capillary 143 (*) 70 - 99 (mg/dL)    Comment 1 Notify RN     GLUCOSE, CAPILLARY     Status: Abnormal   Collection Time   10/30/11  4:45 PM      Component Value Range Comment   Glucose-Capillary 136 (*) 70 - 99 (mg/dL)    Comment 1 Notify RN      Comment 2 Documented in Chart     GLUCOSE, CAPILLARY     Status: Normal   Collection Time   10/30/11  8:53 PM      Component Value Range Comment   Glucose-Capillary 90  70 - 99 (mg/dL)    Comment 1 Documented in Chart      Comment 2 Notify RN     BASIC METABOLIC PANEL     Status: Abnormal   Collection Time   10/31/11  5:47 AM      Component Value Range Comment   Sodium 134 (*)  135 - 145 (mEq/L)    Potassium 4.6  3.5 - 5.1 (mEq/L)    Chloride 98  96 - 112 (mEq/L)    CO2 29  19 - 32 (mEq/L)    Glucose, Bld 182 (*) 70 - 99 (mg/dL)    BUN 16  6 - 23 (mg/dL)    Creatinine, Ser 1.48 (*) 0.50 - 1.10 (mg/dL)    Calcium 8.7  8.4 - 10.5 (mg/dL)    GFR calc non Af Amer 42 (*) >90 (mL/min)    GFR calc Af Amer 48 (*) >90 (mL/min)   GLUCOSE, CAPILLARY     Status: Abnormal   Collection Time   10/31/11  7:41 AM      Component Value Range Comment   Glucose-Capillary 182 (*) 70 - 99 (mg/dL)    Comment 1 Notify RN     GLUCOSE, CAPILLARY     Status: Abnormal   Collection Time   10/31/11 11:23 AM      Component Value Range Comment   Glucose-Capillary 211 (*) 70 - 99 (mg/dL)    Comment 1 Notify RN     GLUCOSE, CAPILLARY     Status: Abnormal   Collection Time   10/31/11  4:11 PM      Component Value Range Comment   Glucose-Capillary 276 (*) 70 - 99 (mg/dL)    Comment 1 Notify RN      Comment 2 Documented in Chart     GLUCOSE,  CAPILLARY     Status: Abnormal   Collection Time   10/31/11  8:49 PM      Component Value Range Comment   Glucose-Capillary 141 (*) 70 - 99 (mg/dL)    Comment 1 Notify RN      Comment 2 Documented in Chart     BASIC METABOLIC PANEL     Status: Abnormal   Collection Time   11/01/11  6:36 AM      Component Value Range Comment   Sodium 135  135 - 145 (mEq/L)    Potassium 4.3  3.5 - 5.1 (mEq/L)    Chloride 99  96 - 112 (mEq/L)    CO2 28  19 - 32 (mEq/L)    Glucose, Bld 182 (*) 70 - 99 (mg/dL)    BUN 14  6 - 23 (mg/dL)    Creatinine, Ser 1.32 (*) 0.50 - 1.10 (mg/dL)    Calcium 9.0  8.4 - 10.5 (mg/dL)    GFR calc non Af Amer 48 (*) >90 (mL/min)    GFR calc Af Amer 55 (*) >90 (mL/min)   VANCOMYCIN, TROUGH     Status: Normal   Collection Time   11/01/11  6:36 AM      Component Value Range Comment   Vancomycin Tr 11.4  10.0 - 20.0 (ug/mL)   GLUCOSE, CAPILLARY     Status: Abnormal   Collection Time   11/01/11  7:18 AM      Component Value Range Comment   Glucose-Capillary 159 (*) 70 - 99 (mg/dL)    Comment 1 Notify RN      Comment 2 Documented in Chart      Recent Results (from the past 240 hour(s))  CULTURE, BLOOD (ROUTINE X 2)     Status: Normal   Collection Time   10/26/11  6:31 PM      Component Value Range Status Comment   Specimen Description RIGHT ANTECUBITAL   Final    Special Requests BOTTLES DRAWN AEROBIC AND ANAEROBIC 6CC  Final    Culture NO GROWTH 5 DAYS   Final    Report Status 10/31/2011 FINAL   Final   CULTURE, BLOOD (ROUTINE X 2)     Status: Normal   Collection Time   10/26/11  6:41 PM      Component Value Range Status Comment   Specimen Description BLOOD RIGHT WRIST DRAWN BY RN   Final    Special Requests BOTTLES DRAWN AEROBIC ONLY 5CC   Final    Culture NO GROWTH 5 DAYS   Final    Report Status 10/31/2011 FINAL   Final      Hospital Course: She was admitted with pain in her thigh. She has chronic lymphedema which was worse. She was started on treatment  with IV antibiotics and improved. Venous access was a problem and we attempted [facility] line here which was unsuccessful so she had to go to [facility] had PICC line placed by interventional radiologist. This was done. She had a little more pain and swelling after that so she stayed in the hospital until the 12th. She will be discharged home on IV antibiotics and improved  Discharge Exam: Blood pressure 166/79, pulse 93, temperature 98.7 F (37.1 C), temperature source Oral, resp. rate 20, height 5' 5" (1.651 m), weight 149.687 kg (330 lb), SpO2 93.00%. She still has marked lymphedema. She is morbidly obese. Her leg is swollen and minimally tender with much less erythema and tenderness than on admission  Disposition: Home with home health services. She will need Rocephin 1 g IV every 24 hours x10 more days and vancomycin 1.5 g IV every 24 hours x10 more days  Discharge Orders    Future Appointments: Provider: Department: Dept Phone: Center:   11/09/2011 10:15 AM Teena Irani, PTA [facility] [phone] None   11/10/2011 10:15 AM Amy Sula Soda, PTA [facility] [phone] None   11/15/2011 10:15 AM Amy Sula Soda, PTA [facility] [phone] None   11/16/2011 10:15 AM Amy Sula Soda, PTA [facility] [phone] None   11/17/2011 10:15 AM Amy Sula Soda, PTA [facility] [phone] None   11/18/2011 10:15 AM Amy Sula Soda, PTA [facility] [phone] None   12/20/2011 10:15 AM Amy Sula Soda, PTA [facility] [phone] None   12/22/2011 10:15 AM Amy Sula Soda, PTA [facility] [phone] None   12/23/2011 10:15 AM Amy Sula Soda, PTA [facility] [phone] None   12/27/2011 10:15 AM Amy Sula Soda, PTA [facility] [phone] None   12/28/2011 10:15 AM Amy Sula Soda, PTA [facility] [phone] None   12/29/2011 10:15 AM Amy Sula Soda, PTA [facility] [phone] None   12/30/2011 10:15 AM Amy Sula Soda,  PTA [facility] [phone] None     Future Orders Please Complete By Expires   Home Health      Questions: Responses:   To provide the following care/treatments PT    RN   Face-to-face encounter      Comments:   I Denisse Whitenack L certify that this patient is under my care and that I, or a nurse practitioner or physician's assistant working with me, had a face-to-face encounter that meets the physician face-to-face encounter requirements with this patient on 11/01/2011.       Questions: Responses:   The encounter with the patient was in whole, or in part, for the following medical condition, which is the primary reason for home health care cellulitis   I certify that, based on my findings, the following services are medically  necessary home health services Nursing    Physical therapy   My clinical findings support the need for the above services Infection requiring IV antibiotics   Further, I certify that my clinical findings support that this patient is homebound (i.e. absences from home require considerable and taxing effort and are for medical reasons or religious services or infrequently or of short duration when for other reasons) Ambulates short distances less than 300 feet   To provide the following care/treatments PT    RN   Discharge patient         Follow-up Information    [facility] up with [facility] [phone].         Signed: Merrell Rettinger L 11/01/2011, 9:00 AM

## 2011-11-01 NOTE — Progress Notes (Signed)
Subjective: She says she feels better. She has much less leg pain. She has no other new complaints. Her leg is better but she is still having some discomfort and of course-  Objective: Vital signs in last 24 hours: Temp:  [98.3 F (36.8 C)-98.8 F (37.1 C)] 98.7 F (37.1 C) (11/12 0524) Pulse Rate:  [90-95] 93  (11/12 0524) Resp:  [20] 20  (11/12 0524) BP: (132-166)/(73-81) 166/79 mmHg (11/12 0524) SpO2:  [93 %-96 %] 93 % (11/12 0524) Weight change:  Last BM Date: 10/31/11  Intake/Output from previous day: 11/11 0701 - 11/12 0700 In: 2205 [P.O.:240; I.V.:1815; IV Piggyback:150] Out: 900 [Urine:900]  PHYSICAL EXAM General appearance: alert, cooperative and morbidly obese Resp: clear to auscultation bilaterally Cardio: regular rate and rhythm, S1, S2 normal, no murmur, click, rub or gallop GI: soft, non-tender; bowel sounds normal; no masses,  no organomegaly Extremities: Lymphedema is marked bilaterally  Lab Results:    Basic Metabolic Panel:  Basename 11/01/11 0636 10/31/11 0547  NA 135 134*  K 4.3 4.6  CL 99 98  CO2 28 29  GLUCOSE 182* 182*  BUN 14 16  CREATININE 1.32* 1.48*  CALCIUM 9.0 8.7  MG -- --  PHOS -- --   Liver Function Tests: No results found for this basename: AST:2,ALT:2,ALKPHOS:2,BILITOT:2,PROT:2,ALBUMIN:2 in the last 72 hours No results found for this basename: LIPASE:2,AMYLASE:2 in the last 72 hours No results found for this basename: AMMONIA:2 in the last 72 hours CBC: No results found for this basename: WBC:2,NEUTROABS:2,HGB:2,HCT:2,MCV:2,PLT:2 in the last 72 hours Cardiac Enzymes: No results found for this basename: CKTOTAL:3,CKMB:3,CKMBINDEX:3,TROPONINI:3 in the last 72 hours BNP: No results found for this basename: POCBNP:3 in the last 72 hours D-Dimer: No results found for this basename: DDIMER:2 in the last 72 hours CBG:  Basename 11/01/11 0718 10/31/11 2049 10/31/11 1611 10/31/11 1123 10/31/11 0741 10/30/11 2053  GLUCAP 159* 141*  276* 211* 182* 90   Hemoglobin A1C: No results found for this basename: HGBA1C in the last 72 hours Fasting Lipid Panel: No results found for this basename: CHOL,HDL,LDLCALC,TRIG,CHOLHDL,LDLDIRECT in the last 72 hours Thyroid Function Tests: No results found for this basename: TSH,T4TOTAL,FREET4,T3FREE,THYROIDAB in the last 72 hours Anemia Panel: No results found for this basename: VITAMINB12,FOLATE,FERRITIN,TIBC,IRON,RETICCTPCT in the last 72 hours Coagulation: No results found for this basename: LABPROT:2,INR:2 in the last 72 hours Urine Drug Screen:  Alcohol Level: No results found for this basename: ETH:2 in the last 72 hours Urinalysis:  Misc. Labs:  ABGS No results found for this basename: PHART,PCO2,PO2ART,TCO2,HCO3 in the last 72 hours CULTURES Recent Results (from the past 240 hour(s))  CULTURE, BLOOD (ROUTINE X 2)     Status: Normal   Collection Time   10/26/11  6:31 PM      Component Value Range Status Comment   Specimen Description RIGHT ANTECUBITAL   Final    Special Requests BOTTLES DRAWN AEROBIC AND ANAEROBIC [place]   Final    Culture NO GROWTH 5 DAYS   Final    Report Status 10/31/2011 FINAL   Final   CULTURE, BLOOD (ROUTINE X 2)     Status: Normal   Collection Time   10/26/11  6:41 PM      Component Value Range Status Comment   Specimen Description BLOOD RIGHT WRIST DRAWN BY RN   Final    Special Requests BOTTLES DRAWN AEROBIC ONLY 5CC   Final    Culture NO GROWTH 5 DAYS   Final    Report Status 10/31/2011 FINAL  Final    Studies/Results: No results found.  Medications:  Scheduled:   . cefTRIAXone (ROCEPHIN) IV  1 g Intravenous Q24H  . enoxaparin (LOVENOX) injection  0.5 mg/kg Subcutaneous Q24H  . insulin aspart  0-20 Units Subcutaneous TID WC  . insulin aspart  0-5 Units Subcutaneous QHS  . insulin aspart  40 Units Subcutaneous BID AC  . potassium chloride  20 mEq Oral TID  . sodium chloride  10 mL Intracatheter Q12H  . sodium chloride  10 mL  Intracatheter Q12H  . vancomycin  1,500 mg Intravenous Q24H  . DISCONTD: furosemide  40 mg Intravenous Q12H  . DISCONTD: piperacillin-tazobactam (ZOSYN)  IV  3.375 g Intravenous Q8H  . DISCONTD: torsemide  50 mg Oral BID   Continuous:   . sodium chloride 75 mL/hr at 11/01/11 8563   JSH:[id number], ondansetron, oxyCODONE-acetaminophen, sodium chloride, sodium chloride, DISCONTD: heparin lock flush  Assesment: Her cellulitis is better and I think she's ready for discharge home Principal Problem:  *Cellulitis of left leg Active Problems:  Lymphedema of lower extremity  Diabetes mellitus  Morbid obesity    Plan: She will be discharged home on Rocephin and vancomycin for 10 more days    LOS: 6 days   Josi Roediger L 11/01/2011, 8:39 AM

## 2011-11-04 ENCOUNTER — Encounter (HOSPITAL_COMMUNITY): Payer: Self-pay | Admitting: Emergency Medicine

## 2011-11-04 ENCOUNTER — Inpatient Hospital Stay (HOSPITAL_COMMUNITY)
Admission: EM | Admit: 2011-11-04 | Discharge: 2011-11-22 | DRG: 070 | Disposition: A | Discharge status: Home / Self Care | Payer: PRIVATE HEALTH INSURANCE | Attending: Internal Medicine | Admitting: Internal Medicine

## 2011-11-04 ENCOUNTER — Emergency Department (HOSPITAL_COMMUNITY): Payer: PRIVATE HEALTH INSURANCE

## 2011-11-04 DIAGNOSIS — N179 Acute kidney failure, unspecified: Secondary | ICD-10-CM | POA: Diagnosis present

## 2011-11-04 DIAGNOSIS — G8929 Other chronic pain: Secondary | ICD-10-CM | POA: Diagnosis present

## 2011-11-04 DIAGNOSIS — I890 Lymphedema, not elsewhere classified: Secondary | ICD-10-CM | POA: Diagnosis present

## 2011-11-04 DIAGNOSIS — L02419 Cutaneous abscess of limb, unspecified: Secondary | ICD-10-CM | POA: Diagnosis present

## 2011-11-04 DIAGNOSIS — K5730 Diverticulosis of large intestine without perforation or abscess without bleeding: Secondary | ICD-10-CM | POA: Diagnosis present

## 2011-11-04 DIAGNOSIS — E1165 Type 2 diabetes mellitus with hyperglycemia: Secondary | ICD-10-CM | POA: Diagnosis present

## 2011-11-04 DIAGNOSIS — Z794 Long term (current) use of insulin: Secondary | ICD-10-CM

## 2011-11-04 DIAGNOSIS — E1129 Type 2 diabetes mellitus with other diabetic kidney complication: Secondary | ICD-10-CM | POA: Diagnosis present

## 2011-11-04 DIAGNOSIS — L03116 Cellulitis of left lower limb: Secondary | ICD-10-CM

## 2011-11-04 DIAGNOSIS — J45909 Unspecified asthma, uncomplicated: Secondary | ICD-10-CM | POA: Diagnosis present

## 2011-11-04 DIAGNOSIS — R0989 Other specified symptoms and signs involving the circulatory and respiratory systems: Secondary | ICD-10-CM | POA: Diagnosis not present

## 2011-11-04 DIAGNOSIS — E860 Dehydration: Secondary | ICD-10-CM | POA: Diagnosis present

## 2011-11-04 DIAGNOSIS — R0609 Other forms of dyspnea: Secondary | ICD-10-CM | POA: Diagnosis not present

## 2011-11-04 DIAGNOSIS — R7401 Elevation of levels of liver transaminase levels: Secondary | ICD-10-CM | POA: Diagnosis present

## 2011-11-04 DIAGNOSIS — E785 Hyperlipidemia, unspecified: Secondary | ICD-10-CM | POA: Diagnosis present

## 2011-11-04 DIAGNOSIS — E875 Hyperkalemia: Secondary | ICD-10-CM | POA: Diagnosis present

## 2011-11-04 DIAGNOSIS — R509 Fever, unspecified: Secondary | ICD-10-CM | POA: Diagnosis present

## 2011-11-04 DIAGNOSIS — R51 Headache: Secondary | ICD-10-CM | POA: Diagnosis present

## 2011-11-04 DIAGNOSIS — J189 Pneumonia, unspecified organism: Secondary | ICD-10-CM | POA: Diagnosis present

## 2011-11-04 DIAGNOSIS — D72819 Decreased white blood cell count, unspecified: Secondary | ICD-10-CM | POA: Diagnosis present

## 2011-11-04 DIAGNOSIS — E119 Type 2 diabetes mellitus without complications: Secondary | ICD-10-CM | POA: Diagnosis present

## 2011-11-04 DIAGNOSIS — E669 Obesity, unspecified: Secondary | ICD-10-CM | POA: Diagnosis present

## 2011-11-04 DIAGNOSIS — N058 Unspecified nephritic syndrome with other morphologic changes: Secondary | ICD-10-CM | POA: Diagnosis present

## 2011-11-04 DIAGNOSIS — G9341 Metabolic encephalopathy: Principal | ICD-10-CM | POA: Diagnosis present

## 2011-11-04 DIAGNOSIS — R112 Nausea with vomiting, unspecified: Secondary | ICD-10-CM

## 2011-11-04 DIAGNOSIS — I10 Essential (primary) hypertension: Secondary | ICD-10-CM | POA: Diagnosis present

## 2011-11-04 DIAGNOSIS — D649 Anemia, unspecified: Secondary | ICD-10-CM | POA: Diagnosis present

## 2011-11-04 DIAGNOSIS — R7402 Elevation of levels of lactic acid dehydrogenase (LDH): Secondary | ICD-10-CM | POA: Diagnosis not present

## 2011-11-04 DIAGNOSIS — Z6841 Body Mass Index (BMI) 40.0 and over, adult: Secondary | ICD-10-CM

## 2011-11-04 DIAGNOSIS — R4182 Altered mental status, unspecified: Secondary | ICD-10-CM | POA: Diagnosis present

## 2011-11-04 DIAGNOSIS — R197 Diarrhea, unspecified: Secondary | ICD-10-CM | POA: Diagnosis present

## 2011-11-04 HISTORY — DX: Other specified postprocedural states: Z98.890

## 2011-11-04 HISTORY — DX: Essential (primary) hypertension: I10

## 2011-11-04 HISTORY — DX: Cellulitis, unspecified: L03.90

## 2011-11-04 HISTORY — DX: Reserved for concepts with insufficient information to code with codable children: IMO0002

## 2011-11-04 HISTORY — DX: Pure hypercholesterolemia, unspecified: E78.00

## 2011-11-04 LAB — URINALYSIS, ROUTINE W REFLEX MICROSCOPIC
Bilirubin Urine: NEGATIVE
Ketones, ur: NEGATIVE mg/dL
Nitrite: NEGATIVE
Protein, ur: >300 mg/dL — AB
Urobilinogen, UA: 0.2 mg/dL (ref 0.0–1.0)

## 2011-11-04 LAB — COMPREHENSIVE METABOLIC PANEL
ALT: 26 U/L (ref 0–35)
Alkaline Phosphatase: 205 U/L — ABNORMAL HIGH (ref 39–117)
BUN: 10 mg/dL (ref 6–23)
CO2: 28 mEq/L (ref 19–32)
Chloride: 100 mEq/L (ref 96–112)
GFR calc Af Amer: 67 mL/min — ABNORMAL LOW (ref >90)
GFR calc non Af Amer: 58 mL/min — ABNORMAL LOW (ref >90)
Glucose, Bld: 98 mg/dL (ref 70–99)
Potassium: 3.7 mEq/L (ref 3.5–5.1)
Total Bilirubin: 0.3 mg/dL (ref 0.3–1.2)
Total Protein: 7.5 g/dL (ref 6.0–8.3)

## 2011-11-04 LAB — LIPASE, BLOOD: Lipase: 41 U/L (ref 11–59)

## 2011-11-04 LAB — CBC
MCHC: 32.3 g/dL (ref 30.0–36.0)
MCV: 87.2 fL (ref 78.0–100.0)
WBC: 8.2 10*3/uL (ref 4.0–10.5)

## 2011-11-04 MED ORDER — SODIUM CHLORIDE 0.9 % IV SOLN
INTRAVENOUS | Status: AC
  Administered 2011-11-04: 18:00:00 via INTRAVENOUS

## 2011-11-04 MED ORDER — ENOXAPARIN SODIUM 40 MG/0.4ML ~~LOC~~ SOLN
40.0000 mg | SUBCUTANEOUS | Status: DC
  Administered 2011-11-04: 40 mg via SUBCUTANEOUS
  Filled 2011-11-04: qty 0.4

## 2011-11-04 MED ORDER — ACETAMINOPHEN 325 MG PO TABS
650.0000 mg | ORAL_TABLET | Freq: Four times a day (QID) | ORAL | Status: DC | PRN
  Administered 2011-11-05 – 2011-11-06 (×4): 650 mg via ORAL
  Filled 2011-11-04 (×4): qty 2

## 2011-11-04 MED ORDER — OXYCODONE-ACETAMINOPHEN 5-325 MG PO TABS
1.0000 | ORAL_TABLET | Freq: Four times a day (QID) | ORAL | Status: DC | PRN
  Administered 2011-11-05 – 2011-11-10 (×6): 1 via ORAL
  Filled 2011-11-04 (×7): qty 1

## 2011-11-04 MED ORDER — VANCOMYCIN HCL IN DEXTROSE 1-5 GM/200ML-% IV SOLN
1000.0000 mg | Freq: Three times a day (TID) | INTRAVENOUS | Status: DC
  Administered 2011-11-04 – 2011-11-05 (×4): 1000 mg via INTRAVENOUS
  Filled 2011-11-04 (×12): qty 200

## 2011-11-04 MED ORDER — IBUPROFEN 400 MG PO TABS
400.0000 mg | ORAL_TABLET | Freq: Once | ORAL | Status: AC
  Administered 2011-11-04: 400 mg via ORAL
  Filled 2011-11-04: qty 1

## 2011-11-04 MED ORDER — SODIUM CHLORIDE 0.9 % IV BOLUS (SEPSIS)
1000.0000 mL | Freq: Once | INTRAVENOUS | Status: AC
  Administered 2011-11-04: 1000 mL via INTRAVENOUS

## 2011-11-04 MED ORDER — ACETAMINOPHEN 650 MG RE SUPP
650.0000 mg | RECTAL | Status: AC
  Administered 2011-11-04: 650 mg via RECTAL
  Filled 2011-11-04: qty 1

## 2011-11-04 MED ORDER — ZOLPIDEM TARTRATE 5 MG PO TABS
5.0000 mg | ORAL_TABLET | Freq: Every evening | ORAL | Status: DC | PRN
  Administered 2011-11-10: 5 mg via ORAL
  Filled 2011-11-04: qty 1

## 2011-11-04 MED ORDER — ONDANSETRON HCL 4 MG/2ML IJ SOLN
4.0000 mg | Freq: Four times a day (QID) | INTRAMUSCULAR | Status: DC | PRN
  Administered 2011-11-05 – 2011-11-10 (×8): 4 mg via INTRAVENOUS
  Filled 2011-11-04 (×9): qty 2

## 2011-11-04 MED ORDER — SODIUM CHLORIDE 0.9 % IJ SOLN
INTRAMUSCULAR | Status: AC
  Filled 2011-11-04: qty 6

## 2011-11-04 MED ORDER — ONDANSETRON HCL 4 MG PO TABS
4.0000 mg | ORAL_TABLET | Freq: Four times a day (QID) | ORAL | Status: DC | PRN
  Administered 2011-11-07 – 2011-11-09 (×2): 4 mg via ORAL
  Filled 2011-11-04 (×2): qty 1

## 2011-11-04 MED ORDER — POTASSIUM CHLORIDE CRYS ER 20 MEQ PO TBCR
20.0000 meq | EXTENDED_RELEASE_TABLET | Freq: Three times a day (TID) | ORAL | Status: DC
  Administered 2011-11-04 – 2011-11-09 (×14): 20 meq via ORAL
  Filled 2011-11-04 (×15): qty 1

## 2011-11-04 MED ORDER — VANCOMYCIN HCL IN DEXTROSE 1-5 GM/200ML-% IV SOLN
INTRAVENOUS | Status: AC
  Filled 2011-11-04: qty 400

## 2011-11-04 MED ORDER — DEXTROSE 5 % IV SOLN
INTRAVENOUS | Status: AC
  Filled 2011-11-04: qty 10

## 2011-11-04 MED ORDER — ACETAMINOPHEN 650 MG RE SUPP
650.0000 mg | Freq: Four times a day (QID) | RECTAL | Status: DC | PRN
  Filled 2011-11-04: qty 1

## 2011-11-04 MED ORDER — SODIUM CHLORIDE 0.9 % IV SOLN
INTRAVENOUS | Status: DC
  Administered 2011-11-04 – 2011-11-10 (×14): via INTRAVENOUS
  Administered 2011-11-11: 1000 mL via INTRAVENOUS

## 2011-11-04 MED ORDER — ONDANSETRON HCL 4 MG/2ML IJ SOLN
4.0000 mg | Freq: Once | INTRAMUSCULAR | Status: AC
  Administered 2011-11-04: 4 mg via INTRAVENOUS
  Filled 2011-11-04: qty 2

## 2011-11-04 MED ORDER — ONDANSETRON HCL 4 MG/2ML IJ SOLN
4.0000 mg | Freq: Three times a day (TID) | INTRAMUSCULAR | Status: DC | PRN
  Administered 2011-11-04: 4 mg via INTRAVENOUS
  Filled 2011-11-04: qty 2

## 2011-11-04 MED ORDER — DEXTROSE 5 % IV SOLN
1.0000 g | INTRAVENOUS | Status: DC
  Administered 2011-11-04 – 2011-11-09 (×6): 1 g via INTRAVENOUS
  Filled 2011-11-04 (×6): qty 10

## 2011-11-04 NOTE — ED Notes (Signed)
Attempted to call report to the floor.

## 2011-11-04 NOTE — H&P (Signed)
[name] MRN: [id number] DOB/AGE: 1966-10-04 45 y.o. Primary Care Physician:Emmani Lesueur L, MD Admit date: 11/04/2011 Chief Complaint: Nausea and vomiting HPI: This is a 45 year old who was in the hospital last week with cellulitis of her leg. She also has lymphedema. She was set up for discharge home on IV antibiotics. She initially did well then developed severe nausea and vomiting and diarrhea. This was felt to be secondary to IV Rocephin but she was taken off of Rocephin and has continued having problems. She had temperature as high as 103. She has not had any abdominal pain. She has not had a cough.  Past Medical History  Diagnosis Date  . Diabetes mellitus   . Chronic pain   . Lymph edema    Past Surgical History  Procedure Date  . Cesarean section      x 2  . Abdominal hysterectomy   . Tonsillectomy   . Adenoidectomy   . Thyroidectomy, partial   . Craniectomy suboccipital w/ cervical laminectomy / chiari         No family history on file. Present positive family history of obesity diabetes COPD CHF and lymphedema Social History:  reports that she has never smoked. She does not have any smokeless tobacco history on file. She reports that she does not drink alcohol or use illicit drugs.   Allergies:  Allergies  Allergen Reactions  . Contrast Media (Iodinated Diagnostic Agents) Swelling    Dye for cardiac cath.  . Other     Pneumonia Vaccine    Medications Prior to Admission  Medication Dose Route Frequency Provider Last Rate Last Dose  . 0.9 %  sodium chloride infusion   Intravenous STAT Threasa Beards, MD 100 mL/hr at 11/04/11 1743    . 0.9 %  sodium chloride infusion   Intravenous Continuous Alonza Bogus      . acetaminophen (TYLENOL) suppository 650 mg  650 mg Rectal STAT Threasa Beards, MD   650 mg at 11/04/11 1312  . acetaminophen (TYLENOL) tablet 650 mg  650 mg Oral Q6H PRN Alonza Bogus       Or  . acetaminophen (TYLENOL) suppository 650  mg  650 mg Rectal Q6H PRN Alonza Bogus      . cefTRIAXone (ROCEPHIN) 1 g in dextrose 5 % 50 mL IVPB  1 g Intravenous Q24H Alonza Bogus      . enoxaparin (LOVENOX) injection 40 mg  40 mg Subcutaneous Q24H Alonza Bogus      . ibuprofen (ADVIL,MOTRIN) tablet 400 mg  400 mg Oral Once Threasa Beards, MD   400 mg at 11/04/11 1500  . ondansetron (ZOFRAN) injection 4 mg  4 mg Intravenous Once Threasa Beards, MD   4 mg at 11/04/11 1312  . ondansetron (ZOFRAN) injection 4 mg  4 mg Intravenous Q8H PRN Threasa Beards, MD   4 mg at 11/04/11 1751  . ondansetron (ZOFRAN) tablet 4 mg  4 mg Oral Q6H PRN Alonza Bogus       Or  . ondansetron [facility]) injection 4 mg  4 mg Intravenous Q6H PRN Alonza Bogus      . oxyCODONE-acetaminophen (PERCOCET) 5-325 MG per tablet 1 tablet  1 tablet Oral QID PRN Alonza Bogus      . potassium chloride SA (K-DUR,KLOR-CON) CR tablet 20 mEq  20 mEq Oral TID Alonza Bogus      . sodium chloride 0.9 % bolus 1,000 mL  1,000  mL Intravenous Once Threasa Beards, MD   1,000 mL at 11/04/11 1312  . sodium chloride 0.9 % injection           . zolpidem (AMBIEN) tablet 5 mg  5 mg Oral QHS PRN Alonza Bogus       Medications Prior to Admission  Medication Sig Dispense Refill  . dextrose 5 % SOLN 50 mL with cefTRIAXone 1 G SOLR 1 g Inject 1 g into the vein daily.  1000 mg  10  . heparin lock flush 100 UNIT/ML SOLN 2.5 mLs (250 Units total) by Intracatheter route once.  250 vial  0  . insulin lispro (HUMALOG) 100 UNIT/ML injection Inject 60 Units into the skin 2 (two) times daily.        Marland Kitchen oxyCODONE-acetaminophen (PERCOCET) 5-325 MG per tablet Take 1 tablet by mouth 4 (four) times daily as needed. For pain       . potassium chloride SA (K-DUR,KLOR-CON) 20 MEQ tablet Take 20 mEq by mouth 3 (three) times daily.        . sodium chloride 0.9 % SOLN 500 mL with vancomycin 1000 MG SOLR 1,500 mg Inject 1,500 mg into the vein daily.  1500 mg  10  . torsemide (DEMADEX) 100  MG tablet Take 50 mg by mouth 2 (two) times daily.             YOV:ZCHYI from the symptoms mentioned above,there are no other symptoms referable to all systems reviewed.  Physical Exam: Blood pressure 159/95, pulse 85, temperature 99.5 F (37.5 C), temperature source Oral, resp. rate 24, SpO2 96.00%. She is morbidly obese. She looks acutely ill. Her mucous membranes are dry. Her neck is supple without masses. Her chest is fairly clear. Her heart is regular without murmur gallop or rub. Her abdomen is soft bowel sounds are hyperactive but she doesn't have any rebound tenderness. Her extremities showed lymphedema but the cellulitis looks better. Her central nervous system examination is intact  Results for orders placed during the hospital encounter of 11/04/11 (from the past 48 hour(s))  URINALYSIS, ROUTINE W REFLEX MICROSCOPIC     Status: Abnormal   Collection Time   11/04/11 12:57 PM      Component Value Range Comment   Color, Urine YELLOW  YELLOW     Appearance HAZY (*) CLEAR     Specific Gravity, Urine 1.020  1.005 - 1.030     pH 6.0  5.0 - 8.0     Glucose, UA NEGATIVE  NEGATIVE (mg/dL)    Hgb urine dipstick SMALL (*) NEGATIVE     Bilirubin Urine NEGATIVE  NEGATIVE     Ketones, ur NEGATIVE  NEGATIVE (mg/dL)    Protein, ur >300 (*) NEGATIVE (mg/dL)    Urobilinogen, UA 0.2  0.0 - 1.0 (mg/dL)    Nitrite NEGATIVE  NEGATIVE     Leukocytes, UA NEGATIVE  NEGATIVE    URINE MICROSCOPIC-ADD ON     Status: Abnormal   Collection Time   11/04/11 12:57 PM      Component Value Range Comment   Squamous Epithelial / LPF MANY (*) RARE     WBC, UA 3-6  <3 (WBC/hpf)    RBC / HPF 0-2  <3 (RBC/hpf)    Bacteria, UA MANY (*) RARE    CBC     Status: Abnormal   Collection Time   11/04/11  1:02 PM      Component Value Range Comment   WBC 8.2  4.0 -  10.5 (K/uL)    RBC 3.37 (*) 3.87 - 5.11 (MIL/uL)    Hemoglobin 9.5 (*) 12.0 - 15.0 (g/dL)    HCT 29.4 (*) 36.0 - 46.0 (%)    MCV 87.2  78.0 - 100.0  (fL)    MCH 28.2  26.0 - 34.0 (pg)    MCHC 32.3  30.0 - 36.0 (g/dL)    RDW 14.8  11.5 - 15.5 (%)    Platelets 351  150 - 400 (K/uL)   COMPREHENSIVE METABOLIC PANEL     Status: Abnormal   Collection Time   11/04/11  1:02 PM      Component Value Range Comment   Sodium 137  135 - 145 (mEq/L)    Potassium 3.7  3.5 - 5.1 (mEq/L)    Chloride 100  96 - 112 (mEq/L)    CO2 28  19 - 32 (mEq/L)    Glucose, Bld 98  70 - 99 (mg/dL)    BUN 10  6 - 23 (mg/dL)    Creatinine, Ser 1.13 (*) 0.50 - 1.10 (mg/dL)    Calcium 9.1  8.4 - 10.5 (mg/dL)    Total Protein 7.5  6.0 - 8.3 (g/dL)    Albumin 2.7 (*) 3.5 - 5.2 (g/dL)    AST 31  0 - 37 (U/L)    ALT 26  0 - 35 (U/L)    Alkaline Phosphatase 205 (*) 39 - 117 (U/L)    Total Bilirubin 0.3  0.3 - 1.2 (mg/dL)    GFR calc non Af Amer 58 (*) >90 (mL/min)    GFR calc Af Amer 67 (*) >90 (mL/min)   LIPASE, BLOOD     Status: Normal   Collection Time   11/04/11  1:02 PM      Component Value Range Comment   Lipase 41  11 - 59 (U/L)    Recent Results (from the past 240 hour(s))  CULTURE, BLOOD (ROUTINE X 2)     Status: Normal   Collection Time   10/26/11  6:31 PM      Component Value Range Status Comment   Specimen Description RIGHT ANTECUBITAL   Final    Special Requests BOTTLES DRAWN AEROBIC AND ANAEROBIC 6CC   Final    Culture NO GROWTH 5 DAYS   Final    Report Status 10/31/2011 FINAL   Final   CULTURE, BLOOD (ROUTINE X 2)     Status: Normal   Collection Time   10/26/11  6:41 PM      Component Value Range Status Comment   Specimen Description BLOOD RIGHT WRIST DRAWN BY RN   Final    Special Requests BOTTLES DRAWN AEROBIC ONLY 5CC   Final    Culture NO GROWTH 5 DAYS   Final    Report Status 10/31/2011 FINAL   Final     Dg Chest 2 View  10/28/2011  *RADIOLOGY REPORT*  Clinical Data: PICC line placement  CHEST - 2 VIEW  Comparison: 10/28/2011  Findings: Left arm PICC line again identified, extending right lateral to mediastinum on PA view. On the  lateral view, this appears to be anteriorly directed, likely within a mediastinal draining vein. This should be repositioned. Cardiac silhouette appears enlarged. Mediastinal contours and pulmonary vascularity grossly normal. Linear subsegmental atelectasis or scarring left lower lobe. Lungs otherwise clear.  IMPRESSION: Left arm PICC line tip appears external to the mediastinum, suspect within and mediastinal draining vein. Recommend repositioning or replacement.  Original Report Authenticated By: Burnetta Sabin,  M.D.   Ir US Guide Vasc Access Right  10/29/2011  *RADIOLOGY REPORT*  Clinical Data: Cellulitis and needs IV access.  PICC LINE PLACEMENT WITH ULTRASOUND AND FLUOROSCOPIC  GUIDANCE  Fluoroscopy Time: 0.3 minutes.  The right arm was prepped with chlorhexidine, draped in the usual sterile fashion using maximum barrier technique (cap and mask, sterile gown, sterile gloves, large sterile sheet, hand hygiene and cutaneous antisepsis) and infiltrated locally with 1% Lidocaine.  Ultrasound demonstrated patency of the right basilic vein, and this was documented with an image.  Under real-time ultrasound guidance, this vein was accessed with a 21 gauge micropuncture needle and image documentation was performed.  The needle was exchanged over a guidewire for a peel-away sheath through which a five [place] dual lumen PICC trimmed to 41 cm was advanced, positioned with its tip at the lower SVC.  Fluoroscopy during the procedure and fluoro spot radiograph confirms appropriate catheter position.  The catheter was flushed, secured to the skin with Prolene sutures, and covered with a sterile dressing.  Complications:  None  IMPRESSION: Successful right arm PICC line placement with ultrasound and fluoroscopic guidance.  The catheter is ready for use.  Original Report Authenticated By: Markus Daft, M.D.   Dg Chest Portable 1 View  11/04/2011  *RADIOLOGY REPORT*  Clinical Data: Cough, shortness of breath, fever  PORTABLE  CHEST - 1 VIEW  Comparison: Chest x-ray of 10/28/2011  Findings: A right upper extremity PICC line is present with the tip seen to the lower SVC.  No pneumothorax is noted.  Cardiomegaly is stable.  No bony abnormality is seen other than degenerative change throughout the thoracic spine.  IMPRESSION: Right PICC line tip in lower SVC.  No pneumothorax.  Original Report Authenticated By: Joretta Bachelor, M.D.   Dg Chest Portable 1 View  10/28/2011  *RADIOLOGY REPORT*  Clinical Data: Left PICC line placement.  PORTABLE CHEST - 1 VIEW  Comparison: 10/26/2011  Findings: Left PICC line is in place.  The tip projects over the medial right lung and appears to be beyond the SVC margin.  I cannot exclude that this is extravascular.  Mild cardiomegaly.  Bibasilar atelectasis.  No effusions.  IMPRESSION: Left PICC line tip projects in beyond the SVC margin over the medial right lung.  Cannot exclude this is extravascular.  Consider replacement.  Original Report Authenticated By: Raelyn Number, M.D.   Dg Chest Portable 1 View  10/26/2011  *RADIOLOGY REPORT*  Clinical Data: Fever.  Cough.  PORTABLE CHEST - 1 VIEW  Comparison: 05/27/2010.  Findings: Cardiomegaly.  Central pulmonary vascular prominence.  No segmental infiltrate.  Limited evaluation of the mediastinum by technique.  The patient would eventually benefit from follow-up two-view chest with cardiac leads removed.  No gross pneumothorax.  IMPRESSION: Cardiomegaly with central pulmonary vascular prominence.  Please see above.  Original Report Authenticated By: Doug Sou, M.D.   Ir Central Line Left  10/29/2011  *RADIOLOGY REPORT*  Clinical Data: Cellulitis and needs IV access.  PICC LINE PLACEMENT WITH ULTRASOUND AND FLUOROSCOPIC  GUIDANCE  Fluoroscopy Time: 0.3 minutes.  The right arm was prepped with chlorhexidine, draped in the usual sterile fashion using maximum barrier technique (cap and mask, sterile gown, sterile gloves, large sterile sheet, hand  hygiene and cutaneous antisepsis) and infiltrated locally with 1% Lidocaine.  Ultrasound demonstrated patency of the right basilic vein, and this was documented with an image.  Under real-time ultrasound guidance, this vein was accessed with a 21 gauge micropuncture needle  and image documentation was performed.  The needle was exchanged over a guidewire for a peel-away sheath through which a five [place] dual lumen PICC trimmed to 41 cm was advanced, positioned with its tip at the lower SVC.  Fluoroscopy during the procedure and fluoro spot radiograph confirms appropriate catheter position.  The catheter was flushed, secured to the skin with Prolene sutures, and covered with a sterile dressing.  Complications:  None  IMPRESSION: Successful right arm PICC line placement with ultrasound and fluoroscopic guidance.  The catheter is ready for use.  Original Report Authenticated By: Markus Daft, M.D.   Impression: She has what may be a gastroenteritis but I'm also concerned that this may be C. difficile colitis and she's been on antibiotics. She had cellulitis of the leg she now has improved and she has lymphedema Active Problems:  * No active hospital problems. *      Plan: I will check for C. difficile. She will continue with her IV antibiotics. She will receive IV fluids and medications for nausea.      Broxton Broady L 11/04/2011, 6:46 PM

## 2011-11-04 NOTE — ED Notes (Signed)
Pt assisted to bedside commode and then back to bed.

## 2011-11-04 NOTE — ED Provider Notes (Signed)
History   This chart was scribed for Threasa Beards, MD by Carolyne Littles. The patient was seen in room APA07/APA07 and the patient's care was started at 12:30PM.   CSN: [id number] Arrival date & time: 11/04/2011 11:44 AM   First MD Initiated Contact with Patient 11/04/11 1207      Chief Complaint  Patient presents with  . Fever / Vomiting     Fever    HPI [name] is a 45 y.o. female who presents to the Emergency Department complaining of  intermittent fever with constant nausea and vomiting that started 2 days ago and has been progressively worsening. She was recently discharged from hospital for cellulitis with PICC line for home IV abx- states she had nausea in the hospital, but no vomiting until the day after discharge. She stated that her fever was measured at 103 at the highest and that she vomits 4 to 5 times a day which prevents her from keeping any food or beverages down. She also confirmed watery diarrhea and abdominal pain described as soreness from vomiting as associated symptoms. She confirmed that she has a history of diabetes and lymph-edema.  Has not tried anything for the symptoms.  Drinking and eating make symptoms worse.  She has not been taking her blood sugar   Past Medical History  Diagnosis Date  . Diabetes mellitus   . Chronic pain   . Lymph edema     Past Surgical History  Procedure Date  . Cesarean section      x 2  . Abdominal hysterectomy   . Tonsillectomy   . Adenoidectomy   . Thyroidectomy, partial   . Craniectomy suboccipital w/ cervical laminectomy / chiari     No family history on file.  History  Substance Use Topics  . Smoking status: Never Smoker   . Smokeless tobacco: Not on file  . Alcohol Use: No    OB History    Grav Para Term Preterm Abortions TAB SAB Ect Mult Living                  Review of Systems 10 Systems reviewed and are negative for acute change except as noted in the HPI.  Allergies  Contrast media and  Other  Home Medications   Current Outpatient Rx  Name Route Sig Dispense Refill  . CEFTRIAXONE  1 GM IVPB MIXTURE Intravenous Inject 1 g into the vein daily. 1000 mg 10  . HEPARIN SODIUM LOCK FLUSH 100 UNIT/ML IV SOLN Intracatheter 2.5 mLs (250 Units total) by Intracatheter route once. 250 vial 0  . INSULIN LISPRO (HUMAN) 100 UNIT/ML  SOLN Subcutaneous Inject 60 Units into the skin 2 (two) times daily.      . OXYCODONE-ACETAMINOPHEN 5-325 MG PO TABS Oral Take 1 tablet by mouth 4 (four) times daily as needed. For pain     . POTASSIUM CHLORIDE CRYS CR 20 MEQ PO TBCR Oral Take 20 mEq by mouth 3 (three) times daily.      Marland Kitchen VANCOMYCIN 1500 MG IVPB Intravenous Inject 1,500 mg into the vein daily. 1500 mg 10  . VANCOMYCIN 1500 MG IVPB Intravenous Inject 1,500 mg into the vein every 12 (twelve) hours. 1500 mg 10  . TORSEMIDE 100 MG PO TABS Oral Take 50 mg by mouth 2 (two) times daily.        BP 171/86  Pulse 94  Temp(Src) 103.2 F (39.6 C) (Oral)  Resp 36  SpO2 97%  Physical Exam  Nursing  note and vitals reviewed. Constitutional: She is oriented to person, place, and time. She appears well-developed and well-nourished. No distress.       Uncomfortable appearing  HENT:  Head: Normocephalic and atraumatic.       Dry mucous membranes  Eyes: EOM are normal. Pupils are equal, round, and reactive to light.  Neck: Neck supple. No tracheal deviation present.  Cardiovascular: Normal rate and regular rhythm.   No murmur heard. Pulmonary/Chest: Effort normal and breath sounds normal. No respiratory distress.  Abdominal: There is tenderness (diffusely). There is no rebound and no guarding.       Hyperactive bowel sounds  Musculoskeletal: Normal range of motion.       Chronic lymph-edema on left medial thigh w/o erythema  Neurological: She is alert and oriented to person, place, and time.  Skin: Skin is warm and dry. No erythema.  Psychiatric: She has a normal mood and affect. Her behavior is  normal.    ED Course  Procedures (including critical care time)  DIAGNOSTIC STUDIES: Oxygen Saturation is 94% on room air, adequate by my interpretation.    COORDINATION OF CARE: 12:32PM-Discussed treatment plan with patient at bedside and patient agreed to plan.   Labs Reviewed  CBC - Abnormal; Notable for the following:    RBC 3.37 (*)    Hemoglobin 9.5 (*)    HCT 29.4 (*)    All other components within normal limits  COMPREHENSIVE METABOLIC PANEL - Abnormal; Notable for the following:    Creatinine, Ser 1.13 (*)    Albumin 2.7 (*)    Alkaline Phosphatase 205 (*)    GFR calc non Af Amer 58 (*)    GFR calc Af Amer 67 (*)    All other components within normal limits  URINALYSIS, ROUTINE W REFLEX MICROSCOPIC - Abnormal; Notable for the following:    Appearance HAZY (*)    Hgb urine dipstick SMALL (*)    Protein, ur >300 (*)    All other components within normal limits  URINE MICROSCOPIC-ADD ON - Abnormal; Notable for the following:    Squamous Epithelial / LPF MANY (*)    Bacteria, UA MANY (*)    All other components within normal limits  LIPASE, BLOOD   Dg Chest Portable 1 View  11/04/2011  *RADIOLOGY REPORT*  Clinical Data: Cough, shortness of breath, fever  PORTABLE CHEST - 1 VIEW  Comparison: Chest x-ray of 10/28/2011  Findings: A right upper extremity PICC line is present with the tip seen to the lower SVC.  No pneumothorax is noted.  Cardiomegaly is stable.  No bony abnormality is seen other than degenerative change throughout the thoracic spine.  IMPRESSION: Right PICC line tip in lower SVC.  No pneumothorax.  Original Report Authenticated By: Joretta Bachelor, M.D.     No diagnosis found.  3:12 PM  D/w Dr. [name], he will admit patient- requests temporary holding orders to be written.    MDM  Pt with fever, nausea/vomiting/diarrhea- appears dehydrated on exam.  Has PICC line in place- no signs of infection there- CXR reassuring, no UTI, abdomen with no significant  tenderness, area of cellulitis on left upper thigh appears to be improved if not entirely resolved.  No further vomiting after zofran, however will continue IV hydration- pt to be admitted by Dr. [name]   I personally performed the services described in this documentation, which was scribed in my presence. The recorded information has been reviewed and considered.    Orlean Patten  Linker, MD 11/04/11 1539

## 2011-11-04 NOTE — Progress Notes (Signed)
ANTIBIOTIC CONSULT NOTE - INITIAL  Pharmacy Consult for Vancomycin Indication: cellulitis  Allergies  Allergen Reactions  . Contrast Media (Iodinated Diagnostic Agents) Swelling    Dye for cardiac cath.  . Pneumococcal Vaccines     Patient Measurements: Height: _0  (165.1 cm) Weight: 355 lb 2.6 oz (161.1 kg) IBW/kg (Calculated) : 57  Adjusted Body Weight : 88.2 KG  Vital Signs: Temp: 99.5 F (37.5 C) (11/15 1902) Temp src: Oral (11/15 1902) BP: 159/95 mmHg (11/15 1902) Pulse Rate: 85  (11/15 1902) Intake/Output from previous day:   Intake/Output from this shift:    Labs:  Basename 11/04/11 1302  WBC 8.2  HGB 9.5*  PLT 351  LABCREA --  CREATININE 1.13*   Estimated Creatinine Clearance: 97.9 ml/min (by C-G formula based on Cr of 1.13).  No results found for this basename: VANCOTROUGH:2,VANCOPEAK:2,VANCORANDOM:2,GENTTROUGH:2,GENTPEAK:2,GENTRANDOM:2,TOBRATROUGH:2,TOBRAPEAK:2,TOBRARND:2,AMIKACINPEAK:2,AMIKACINTROU:2,AMIKACIN:2, in the last 72 hours   Microbiology: Recent Results (from the past 720 hour(s))  CULTURE, BLOOD (ROUTINE X 2)     Status: Normal   Collection Time   10/26/11  6:31 PM      Component Value Range Status Comment   Specimen Description RIGHT ANTECUBITAL   Final    Special Requests BOTTLES DRAWN AEROBIC AND ANAEROBIC [place]   Final    Culture NO GROWTH 5 DAYS   Final    Report Status 10/31/2011 FINAL   Final   CULTURE, BLOOD (ROUTINE X 2)     Status: Normal   Collection Time   10/26/11  6:41 PM      Component Value Range Status Comment   Specimen Description BLOOD RIGHT WRIST DRAWN BY RN   Final    Special Requests BOTTLES DRAWN AEROBIC ONLY 5CC   Final    Culture NO GROWTH 5 DAYS   Final    Report Status 10/31/2011 FINAL   Final     Medical History: Past Medical History  Diagnosis Date  . Diabetes mellitus   . Chronic pain   . Lymph edema     Medications:  Prescriptions prior to admission  Medication Sig Dispense Refill  .  Acetaminophen (RA FEVER REDUCER/PAIN RELIEVER PO) Take 1 tablet by mouth as needed. For fever       . dextrose 5 % SOLN 50 mL with cefTRIAXone 1 G SOLR 1 g Inject 1 g into the vein daily.  1000 mg  10  . heparin lock flush 100 UNIT/ML SOLN 2.5 mLs (250 Units total) by Intracatheter route once.  250 vial  0  . insulin lispro (HUMALOG) 100 UNIT/ML injection Inject 60 Units into the skin 2 (two) times daily.        Marland Kitchen oxyCODONE-acetaminophen (PERCOCET) 5-325 MG per tablet Take 1 tablet by mouth 4 (four) times daily as needed. For pain       . potassium chloride SA (K-DUR,KLOR-CON) 20 MEQ tablet Take 20 mEq by mouth 3 (three) times daily.        . sodium chloride 0.9 % SOLN 500 mL with vancomycin 1000 MG SOLR 1,500 mg Inject 1,500 mg into the vein daily.  1500 mg  10  . torsemide (DEMADEX) 100 MG tablet Take 50 mg by mouth 2 (two) times daily.         Assessment: Okay for Protocol Recent outpatient Vancomycin regimen completed.  Goal of Therapy:  Vancomycin trough level 10-15 mcg/ml  Plan:  Measure antibiotic drug levels at steady state Vancomycin 1013m IV every 8 hours.  HBiagio QuintR 11/04/2011,7:10 PM

## 2011-11-04 NOTE — ED Notes (Signed)
Per EMS pt experiencing nausea and vomiting starting this morning.  Per EMS pt has PICC line.  Per EMS pt is diabetic.  Per EMS pt was treated at [facility] for 6 days and released yesterday for cellulitis.  Per EMS pt temperature was 102.9 en route.

## 2011-11-05 LAB — CBC
Hemoglobin: 9.5 g/dL — ABNORMAL LOW (ref 12.0–15.0)
MCH: 29.1 pg (ref 26.0–34.0)
MCHC: 33.1 g/dL (ref 30.0–36.0)
MCV: 88 fL (ref 78.0–100.0)
RBC: 3.26 MIL/uL — ABNORMAL LOW (ref 3.87–5.11)

## 2011-11-05 LAB — GLUCOSE, CAPILLARY
Glucose-Capillary: 209 mg/dL — ABNORMAL HIGH (ref 70–99)
Glucose-Capillary: 202 mg/dL — ABNORMAL HIGH (ref 70–99)
Glucose-Capillary: 158 mg/dL — ABNORMAL HIGH (ref 70–99)

## 2011-11-05 LAB — BASIC METABOLIC PANEL
BUN: 12 mg/dL (ref 6–23)
CO2: 28 mEq/L (ref 19–32)
Calcium: 8.7 mg/dL (ref 8.4–10.5)
Glucose, Bld: 155 mg/dL — ABNORMAL HIGH (ref 70–99)
Potassium: 3.9 mEq/L (ref 3.5–5.1)
Sodium: 137 mEq/L (ref 135–145)

## 2011-11-05 MED ORDER — SODIUM CHLORIDE 0.9 % IJ SOLN
INTRAMUSCULAR | Status: AC
  Filled 2011-11-05: qty 3

## 2011-11-05 MED ORDER — INSULIN GLARGINE 100 UNIT/ML ~~LOC~~ SOLN
20.0000 [IU] | Freq: Every day | SUBCUTANEOUS | Status: DC
  Administered 2011-11-05 – 2011-11-11 (×6): 20 [IU] via SUBCUTANEOUS
  Filled 2011-11-05 (×2): qty 3

## 2011-11-05 MED ORDER — CIPROFLOXACIN IN D5W 400 MG/200ML IV SOLN
400.0000 mg | Freq: Two times a day (BID) | INTRAVENOUS | Status: DC
  Administered 2011-11-05 – 2011-11-11 (×13): 400 mg via INTRAVENOUS
  Filled 2011-11-05 (×16): qty 200

## 2011-11-05 MED ORDER — INSULIN ASPART 100 UNIT/ML ~~LOC~~ SOLN
0.0000 [IU] | Freq: Three times a day (TID) | SUBCUTANEOUS | Status: DC
  Administered 2011-11-05 (×2): 7 [IU] via SUBCUTANEOUS
  Administered 2011-11-06 – 2011-11-07 (×5): 4 [IU] via SUBCUTANEOUS
  Administered 2011-11-07 – 2011-11-08 (×2): 3 [IU] via SUBCUTANEOUS
  Administered 2011-11-09 – 2011-11-13 (×2): 4 [IU] via SUBCUTANEOUS
  Administered 2011-11-13 – 2011-11-16 (×5): 3 [IU] via SUBCUTANEOUS
  Administered 2011-11-16: 4 [IU] via SUBCUTANEOUS
  Administered 2011-11-16: 3 [IU] via SUBCUTANEOUS
  Administered 2011-11-17: 4 [IU] via SUBCUTANEOUS
  Administered 2011-11-17: 3 [IU] via SUBCUTANEOUS
  Administered 2011-11-17: 4 [IU] via SUBCUTANEOUS
  Administered 2011-11-18: 3 [IU] via SUBCUTANEOUS
  Administered 2011-11-18: 4 [IU] via SUBCUTANEOUS
  Administered 2011-11-18: 9 [IU] via SUBCUTANEOUS
  Administered 2011-11-19 – 2011-11-20 (×5): 4 [IU] via SUBCUTANEOUS
  Administered 2011-11-20: 11 [IU] via SUBCUTANEOUS
  Administered 2011-11-21 (×2): 7 [IU] via SUBCUTANEOUS
  Administered 2011-11-21: 4 [IU] via SUBCUTANEOUS
  Administered 2011-11-22: 3 [IU] via SUBCUTANEOUS
  Filled 2011-11-05 (×3): qty 3

## 2011-11-05 MED ORDER — ENOXAPARIN SODIUM 80 MG/0.8ML ~~LOC~~ SOLN
80.0000 mg | SUBCUTANEOUS | Status: DC
  Administered 2011-11-05 – 2011-11-12 (×7): 80 mg via SUBCUTANEOUS
  Filled 2011-11-05 (×8): qty 0.8

## 2011-11-05 MED ORDER — INSULIN ASPART 100 UNIT/ML ~~LOC~~ SOLN: 0.0000 [IU] | Freq: Every day | SUBCUTANEOUS | Status: DC

## 2011-11-05 MED ORDER — SODIUM CHLORIDE 0.9 % IJ SOLN
INTRAMUSCULAR | Status: AC
  Administered 2011-11-05: 10 mL
  Filled 2011-11-05: qty 3

## 2011-11-05 MED ORDER — PROMETHAZINE HCL 25 MG/ML IJ SOLN
12.5000 mg | INTRAMUSCULAR | Status: DC | PRN
  Administered 2011-11-05 – 2011-11-12 (×11): 12.5 mg via INTRAVENOUS
  Filled 2011-11-05 (×13): qty 1

## 2011-11-05 NOTE — Progress Notes (Signed)
Subjective: She continues to have fever and nausea. She's not had any diarrhea. She's complaining of some left flank pain now.  Objective: Vital signs in last 24 hours: Temp:  [98.4 F (36.9 C)-103.2 F (39.6 C)] 100.3 F (37.9 C) (11/16 0535) Pulse Rate:  [84-97] 97  (11/16 0535) Resp:  [20-36] 24  (11/16 0535) BP: (142-171)/(73-103) 147/91 mmHg (11/16 0535) SpO2:  [91 %-99 %] 91 % (11/16 0535) Weight:  [161.1 kg (355 lb 2.6 oz)-164.5 kg (362 lb 10.5 oz)] 362 lb 10.5 oz (164.5 kg) (11/16 0535) Weight change:  Last BM Date: 11/03/11  Intake/Output from previous day: 11/15 0701 - 11/16 0700 In: 739.6 [I.V.:489.6; IV Piggyback:250] Out: -   PHYSICAL EXAM General appearance: alert, moderate distress and morbidly obese Resp: clear to auscultation bilaterally Cardio: regular rate and rhythm, S1, S2 normal, no murmur, click, rub or gallop GI: soft, non-tender; bowel sounds normal; no masses,  no organomegaly Extremities: Lymphedema  Lab Results:    Basic Metabolic Panel:  Basename 11/05/11 0429 11/04/11 1302  NA 137 137  K 3.9 3.7  CL 100 100  CO2 28 28  GLUCOSE 155* 98  BUN 12 10  CREATININE 1.20* 1.13*  CALCIUM 8.7 9.1  MG -- --  PHOS -- --   Liver Function Tests:  [facility] 11/04/11 1302  AST 31  ALT 26  ALKPHOS 205*  BILITOT 0.3  PROT 7.5  ALBUMIN 2.7*    Basename 11/04/11 1302  LIPASE 41  AMYLASE --   No results found for this basename: AMMONIA:2 in the last 72 hours CBC:  Basename 11/05/11 0429 11/04/11 1302  WBC 7.6 8.2  NEUTROABS -- --  HGB 9.5* 9.5*  HCT 28.7* 29.4*  MCV 88.0 87.2  PLT 359 351   Cardiac Enzymes: No results found for this basename: CKTOTAL:3,CKMB:3,CKMBINDEX:3,TROPONINI:3 in the last 72 hours BNP: No results found for this basename: POCBNP:3 in the last 72 hours D-Dimer: No results found for this basename: DDIMER:2 in the last 72 hours CBG:  Basename 11/05/11 0810 11/04/11 2055  GLUCAP 155* 129*   Hemoglobin  A1C: No results found for this basename: HGBA1C in the last 72 hours Fasting Lipid Panel: No results found for this basename: CHOL,HDL,LDLCALC,TRIG,CHOLHDL,LDLDIRECT in the last 72 hours Thyroid Function Tests: No results found for this basename: TSH,T4TOTAL,FREET4,T3FREE,THYROIDAB in the last 72 hours Anemia Panel: No results found for this basename: VITAMINB12,FOLATE,FERRITIN,TIBC,IRON,RETICCTPCT in the last 72 hours Coagulation: No results found for this basename: LABPROT:2,INR:2 in the last 72 hours Urine Drug Screen:  Alcohol Level: No results found for this basename: ETH:2 in the last 72 hours Urinalysis:  Misc. Labs:  ABGS No results found for this basename: PHART,PCO2,PO2ART,TCO2,HCO3 in the last 72 hours CULTURES Recent Results (from the past 240 hour(s))  CULTURE, BLOOD (ROUTINE X 2)     Status: Normal   Collection Time   10/26/11  6:31 PM      Component Value Range Status Comment   Specimen Description RIGHT ANTECUBITAL   Final    Special Requests BOTTLES DRAWN AEROBIC AND ANAEROBIC [place]   Final    Culture NO GROWTH 5 DAYS   Final    Report Status 10/31/2011 FINAL   Final   CULTURE, BLOOD (ROUTINE X 2)     Status: Normal   Collection Time   10/26/11  6:41 PM      Component Value Range Status Comment   Specimen Description BLOOD RIGHT WRIST DRAWN BY RN   Final    Special Requests  BOTTLES DRAWN AEROBIC ONLY 5CC   Final    Culture NO GROWTH 5 DAYS   Final    Report Status 10/31/2011 FINAL   Final    Studies/Results: Dg Chest Portable 1 View  11/04/2011  *RADIOLOGY REPORT*  Clinical Data: Cough, shortness of breath, fever  PORTABLE CHEST - 1 VIEW  Comparison: Chest x-ray of 10/28/2011  Findings: A right upper extremity PICC line is present with the tip seen to the lower SVC.  No pneumothorax is noted.  Cardiomegaly is stable.  No bony abnormality is seen other than degenerative change throughout the thoracic spine.  IMPRESSION: Right PICC line tip in lower SVC.  No  pneumothorax.  Original Report Authenticated By: Joretta Bachelor, M.D.    Medications:  Scheduled:   . sodium chloride   Intravenous STAT  . acetaminophen  650 mg Rectal STAT  . cefTRIAXone (ROCEPHIN) IV  1 g Intravenous Q24H  . enoxaparin  40 mg Subcutaneous Q24H  . ibuprofen  400 mg Oral Once  . insulin aspart  0-20 Units Subcutaneous TID WC  . insulin aspart  0-5 Units Subcutaneous QHS  . insulin glargine  20 Units Subcutaneous Daily  . ondansetron  4 mg Intravenous Once  . potassium chloride SA  20 mEq Oral TID  . sodium chloride  1,000 mL Intravenous Once  . sodium chloride      . sodium chloride      . vancomycin  1,000 mg Intravenous Q8H   Continuous:   . sodium chloride 125 mL/hr at 11/04/11 2005   NID:[id number], acetaminophen, ondansetron (ZOFRAN) IV, ondansetron, oxyCODONE-acetaminophen, zolpidem, DISCONTD: ondansetron (ZOFRAN) IV  Assesment: She has had cellulitis of the leg. She now developed nausea vomiting and diarrhea and high fever. She's complaining of left flank pain so I'm at least somewhat concerned that she has a pyelonephritis Active Problems:  * No active hospital problems. *     Plan: I will have her start Cipro continue with her other treatments get more blood cultures and make sure her urine culture has been done    LOS: 1 day   [name] 11/05/2011, 9:22 AM

## 2011-11-05 NOTE — Progress Notes (Signed)
ANTIBIOTIC CONSULT NOTE - follow up  Pharmacy Consult for Vancomycin Cipro added 11/16 Indication: cellulitis  Allergies  Allergen Reactions  . Contrast Media (Iodinated Diagnostic Agents) Swelling    Dye for cardiac cath.  . Pneumococcal Vaccines    Patient Measurements: Height: _0  (165.1 cm) Weight: 362 lb 10.5 oz (164.5 kg) IBW/kg (Calculated) : 57  Adjusted Body Weight : 88.2 KG  Vital Signs: Temp: 100.3 F (37.9 C) (11/16 0535) Temp src: Oral (11/16 0535) BP: 147/91 mmHg (11/16 0535) Pulse Rate: 97  (11/16 0535) Intake/Output from previous day: 11/15 0701 - 11/16 0700 In: 739.6 [I.V.:489.6; IV Piggyback:250] Out: -  Intake/Output from this shift:    Labs:  [facility] 11/05/11 0429 11/04/11 1302  WBC 7.6 8.2  HGB 9.5* 9.5*  PLT 359 351  LABCREA -- --  CREATININE 1.20* 1.13*   Estimated Creatinine Clearance: 93.5 ml/min (by C-G formula based on Cr of 1.2).  No results found for this basename: VANCOTROUGH:2,VANCOPEAK:2,VANCORANDOM:2,GENTTROUGH:2,GENTPEAK:2,GENTRANDOM:2,TOBRATROUGH:2,TOBRAPEAK:2,TOBRARND:2,AMIKACINPEAK:2,AMIKACINTROU:2,AMIKACIN:2, in the last 72 hours   Microbiology: Recent Results (from the past 720 hour(s))  CULTURE, BLOOD (ROUTINE X 2)     Status: Normal   Collection Time   10/26/11  6:31 PM      Component Value Range Status Comment   Specimen Description RIGHT ANTECUBITAL   Final    Special Requests BOTTLES DRAWN AEROBIC AND ANAEROBIC [place]   Final    Culture NO GROWTH 5 DAYS   Final    Report Status 10/31/2011 FINAL   Final   CULTURE, BLOOD (ROUTINE X 2)     Status: Normal   Collection Time   10/26/11  6:41 PM      Component Value Range Status Comment   Specimen Description BLOOD RIGHT WRIST DRAWN BY RN   Final    Special Requests BOTTLES DRAWN AEROBIC ONLY 5CC   Final    Culture NO GROWTH 5 DAYS   Final    Report Status 10/31/2011 FINAL   Final    Medical History: Past Medical History  Diagnosis Date  . Diabetes mellitus   .  Chronic pain   . Lymph edema    Medications:  Prescriptions prior to admission  Medication Sig Dispense Refill  . Acetaminophen (RA FEVER REDUCER/PAIN RELIEVER PO) Take 1 tablet by mouth as needed. For fever       . dextrose 5 % SOLN 50 mL with cefTRIAXone 1 G SOLR 1 g Inject 1 g into the vein daily.  1000 mg  10  . heparin lock flush 100 UNIT/ML SOLN 2.5 mLs (250 Units total) by Intracatheter route once.  250 vial  0  . insulin lispro (HUMALOG) 100 UNIT/ML injection Inject 60 Units into the skin 2 (two) times daily.        Marland Kitchen oxyCODONE-acetaminophen (PERCOCET) 5-325 MG per tablet Take 1 tablet by mouth 4 (four) times daily as needed. For pain       . potassium chloride SA (K-DUR,KLOR-CON) 20 MEQ tablet Take 20 mEq by mouth 3 (three) times daily.        . sodium chloride 0.9 % SOLN 500 mL with vancomycin 1000 MG SOLR 1,500 mg Inject 1,500 mg into the vein daily.  1500 mg  10  . torsemide (DEMADEX) 100 MG tablet Take 50 mg by mouth 2 (two) times daily.         Assessment: SCr rising slightly Recent outpatient Vancomycin regimen completed.  Goal of Therapy:  Vancomycin trough level 10-15 mcg/ml  Plan:  Measure antibiotic  drug levels at steady state Vancomycin 1062m IV every 8 hours. Check trough tomorrow Cipro 4016miv q12hrs F/U micro data as appropriate Labs per protocol  HaHart Robinsons 11/05/2011,9:38 AM

## 2011-11-05 NOTE — Progress Notes (Signed)
Inpatient Diabetes Program Recommendations  AACE/ADA: New Consensus Statement on Inpatient Glycemic Control (2009)  Target Ranges:  Prepandial:   less than 140 mg/dL      Peak postprandial:   less than 180 mg/dL (1-2 hours)      Critically ill patients:  140 - 180 mg/dL   Reason for Visit: Elevated prandial glucose:  209 mg/dL  Inpatient Diabetes Program Recommendations Insulin - Meal Coverage: Add meal coverage:  Consider starting with Novolog 10 units TID:  Takes 60 units BID at home  Note: Added Lantus today.  Would add meal coverage as well but consider beginning with lower dose and titrating as needed.

## 2011-11-06 ENCOUNTER — Inpatient Hospital Stay (HOSPITAL_COMMUNITY): Payer: PRIVATE HEALTH INSURANCE

## 2011-11-06 LAB — DIFFERENTIAL
Basophils Absolute: 0 10*3/uL (ref 0.0–0.1)
Basophils Relative: 1 % (ref 0–1)
Eosinophils Relative: 0 % (ref 0–5)
Lymphocytes Relative: 17 % (ref 12–46)
Neutro Abs: 4.4 10*3/uL (ref 1.7–7.7)

## 2011-11-06 LAB — VANCOMYCIN, TROUGH: Vancomycin Tr: 25.5 ug/mL (ref 10.0–20.0)

## 2011-11-06 LAB — BASIC METABOLIC PANEL
CO2: 28 mEq/L (ref 19–32)
Calcium: 8.5 mg/dL (ref 8.4–10.5)
Chloride: 98 mEq/L (ref 96–112)
GFR calc Af Amer: 54 mL/min — ABNORMAL LOW (ref >90)
Sodium: 133 mEq/L — ABNORMAL LOW (ref 135–145)

## 2011-11-06 LAB — CBC
MCHC: 32.9 g/dL (ref 30.0–36.0)
Platelets: 337 10*3/uL (ref 150–400)
RDW: 15.4 % (ref 11.5–15.5)
WBC: 5.8 10*3/uL (ref 4.0–10.5)

## 2011-11-06 LAB — GLUCOSE, CAPILLARY: Glucose-Capillary: 171 mg/dL — ABNORMAL HIGH (ref 70–99)

## 2011-11-06 MED ORDER — SODIUM CHLORIDE 0.9 % IJ SOLN
INTRAMUSCULAR | Status: AC
  Administered 2011-11-06: 10 mL
  Filled 2011-11-06: qty 3

## 2011-11-06 MED ORDER — SODIUM CHLORIDE 0.9 % IJ SOLN
INTRAMUSCULAR | Status: AC
  Filled 2011-11-06: qty 3

## 2011-11-06 MED ORDER — ACETAMINOPHEN 325 MG PO TABS
ORAL_TABLET | ORAL | Status: AC
  Administered 2011-11-06: 650 mg via ORAL
  Filled 2011-11-06: qty 2

## 2011-11-06 MED ORDER — ACETAMINOPHEN 650 MG RE SUPP: 650.0000 mg | RECTAL | Status: DC | PRN

## 2011-11-06 MED ORDER — INSULIN ASPART 100 UNIT/ML ~~LOC~~ SOLN
5.0000 [IU] | Freq: Three times a day (TID) | SUBCUTANEOUS | Status: DC
  Administered 2011-11-07 – 2011-11-22 (×33): 5 [IU] via SUBCUTANEOUS

## 2011-11-06 MED ORDER — VANCOMYCIN HCL IN DEXTROSE 1-5 GM/200ML-% IV SOLN
1000.0000 mg | Freq: Two times a day (BID) | INTRAVENOUS | Status: DC
  Administered 2011-11-06 – 2011-11-07 (×3): 1000 mg via INTRAVENOUS

## 2011-11-06 MED ORDER — SODIUM CHLORIDE 0.9 % IJ SOLN
10.0000 mL | Freq: Two times a day (BID) | INTRAMUSCULAR | Status: DC
  Administered 2011-11-06 – 2011-11-11 (×10): 10 mL
  Filled 2011-11-06 (×9): qty 3

## 2011-11-06 MED ORDER — TOBRAMYCIN SULFATE 80 MG/2ML IJ SOLN
7.0000 mg/kg | INTRAVENOUS | Status: DC
  Administered 2011-11-06: 620 mg via INTRAVENOUS
  Filled 2011-11-06 (×3): qty 15.5

## 2011-11-06 MED ORDER — SODIUM CHLORIDE 0.9 % IJ SOLN
10.0000 mL | INTRAMUSCULAR | Status: DC | PRN
  Administered 2011-11-06 – 2011-11-07 (×5): 10 mL
  Filled 2011-11-06 (×6): qty 3

## 2011-11-06 MED ORDER — ENSURE CLINICAL ST REVIGOR PO LIQD
237.0000 mL | Freq: Three times a day (TID) | ORAL | Status: DC
  Administered 2011-11-07 – 2011-11-11 (×7): 237 mL via ORAL
  Administered 2011-11-12 – 2011-11-18 (×3): via ORAL

## 2011-11-06 MED ORDER — ACETAMINOPHEN 325 MG PO TABS
650.0000 mg | ORAL_TABLET | ORAL | Status: DC | PRN
  Administered 2011-11-06 – 2011-11-13 (×19): 650 mg via ORAL
  Filled 2011-11-06 (×19): qty 2

## 2011-11-06 NOTE — Progress Notes (Signed)
CRITICAL VALUE ALERT  Critical value received:  Vancomycin trough 25.5  Date of notification:  11/06/11  Time of notification:  0618  Critical value read back:yes  Nurse who received alert:  Manuela Schwartz, RN  MD notified (1st page):    Time of first page:    MD notified (2nd page):  Time of second page:  Responding MD:    Time MD responded:    Hart Robinsons, PharmD notified via phone of results order given to hold dose of vancomycin.

## 2011-11-06 NOTE — Progress Notes (Signed)
Subjective: She continues to have nausea. She continues to have fever despite 3 antibiotics. She has some flank pain and I think she may have a pyelonephritis. I will see if she can have a CT of the abdomen and pelvis but I think she may be too large for the equipment.  Objective: Vital signs in last 24 hours: Temp:  [100.5 F (38.1 C)-102.5 F (39.2 C)] 102.4 F (39.1 C) (11/17 0932) Pulse Rate:  [92-97] 92  (11/17 0605) Resp:  [20-24] 20  (11/17 0605) BP: (155-207)/(92-110) 163/101 mmHg (11/17 0605) SpO2:  [95 %-98 %] 98 % (11/17 0605) Weight change:  Last BM Date: 11/03/11  Intake/Output from previous day: 11/16 0701 - 11/17 0700 In: 4035 [P.O.:1060; I.V.:2575; IV Piggyback:400] Out: 1400 [Urine:1400]  PHYSICAL EXAM General appearance: alert, cooperative and moderate distress Resp: clear to auscultation bilaterally Cardio: regular rate and rhythm, S1, S2 normal, no murmur, click, rub or gallop GI: soft, non-tender; bowel sounds normal; no masses,  no organomegaly Extremities: Lymphedema  Lab Results:    Basic Metabolic Panel:  Basename 11/06/11 0509 11/05/11 0429  NA 133* 137  K 3.9 3.9  CL 98 100  CO2 28 28  GLUCOSE 164* 155*  BUN 12 12  CREATININE 1.36* 1.20*  CALCIUM 8.5 8.7  MG -- --  PHOS -- --   Liver Function Tests:  Basename 11/04/11 1302  AST 31  ALT 26  ALKPHOS 205*  BILITOT 0.3  PROT 7.5  ALBUMIN 2.7*    Basename 11/04/11 1302  LIPASE 41  AMYLASE --   No results found for this basename: AMMONIA:2 in the last 72 hours CBC:  Basename 11/06/11 0509 11/05/11 0429  WBC 5.8 7.6  NEUTROABS 4.4 --  HGB 9.8* 9.5*  HCT 29.8* 28.7*  MCV 87.6 88.0  PLT 337 359   Cardiac Enzymes: No results found for this basename: CKTOTAL:3,CKMB:3,CKMBINDEX:3,TROPONINI:3 in the last 72 hours BNP: No results found for this basename: POCBNP:3 in the last 72 hours D-Dimer: No results found for this basename: DDIMER:2 in the last 72 hours CBG:  Basename  11/06/11 0804 11/05/11 2157 11/05/11 1616 11/05/11 1103 11/05/11 0810 11/04/11 2055  GLUCAP 171* 158* 202* 209* 155* 129*   Hemoglobin A1C: No results found for this basename: HGBA1C in the last 72 hours Fasting Lipid Panel: No results found for this basename: CHOL,HDL,LDLCALC,TRIG,CHOLHDL,LDLDIRECT in the last 72 hours Thyroid Function Tests: No results found for this basename: TSH,T4TOTAL,FREET4,T3FREE,THYROIDAB in the last 72 hours Anemia Panel: No results found for this basename: VITAMINB12,FOLATE,FERRITIN,TIBC,IRON,RETICCTPCT in the last 72 hours Coagulation: No results found for this basename: LABPROT:2,INR:2 in the last 72 hours Urine Drug Screen:  Alcohol Level: No results found for this basename: ETH:2 in the last 72 hours Urinalysis:  Misc. Labs:  ABGS No results found for this basename: PHART,PCO2,PO2ART,TCO2,HCO3 in the last 72 hours CULTURES Recent Results (from the past 240 hour(s))  CULTURE, BLOOD (ROUTINE X 2)     Status: Normal (Preliminary result)   Collection Time   11/06/11 10:40 AM      Component Value Range Status Comment   Specimen Description BLOOD   Final    Special Requests Normal   Final    Culture PENDING   Incomplete    Report Status PENDING   Incomplete    Studies/Results: Dg Chest Portable 1 View  11/04/2011  *RADIOLOGY REPORT*  Clinical Data: Cough, shortness of breath, fever  PORTABLE CHEST - 1 VIEW  Comparison: Chest x-ray of 10/28/2011  Findings: A right  upper extremity PICC line is present with the tip seen to the lower SVC.  No pneumothorax is noted.  Cardiomegaly is stable.  No bony abnormality is seen other than degenerative change throughout the thoracic spine.  IMPRESSION: Right PICC line tip in lower SVC.  No pneumothorax.  Original Report Authenticated By: Joretta Bachelor, M.D.    Medications:  Prior to Admission:  Prescriptions prior to admission  Medication Sig Dispense Refill  . Acetaminophen (RA FEVER REDUCER/PAIN RELIEVER PO)  Take 1 tablet by mouth as needed. For fever       . dextrose 5 % SOLN 50 mL with cefTRIAXone 1 G SOLR 1 g Inject 1 g into the vein daily.  1000 mg  10  . heparin lock flush 100 UNIT/ML SOLN 2.5 mLs (250 Units total) by Intracatheter route once.  250 vial  0  . insulin lispro (HUMALOG) 100 UNIT/ML injection Inject 60 Units into the skin 2 (two) times daily.        Marland Kitchen oxyCODONE-acetaminophen (PERCOCET) 5-325 MG per tablet Take 1 tablet by mouth 4 (four) times daily as needed. For pain       . potassium chloride SA (K-DUR,KLOR-CON) 20 MEQ tablet Take 20 mEq by mouth 3 (three) times daily.        . sodium chloride 0.9 % SOLN 500 mL with vancomycin 1000 MG SOLR 1,500 mg Inject 1,500 mg into the vein daily.  1500 mg  10  . torsemide (DEMADEX) 100 MG tablet Take 50 mg by mouth 2 (two) times daily.         Scheduled:   . cefTRIAXone (ROCEPHIN) IV  1 g Intravenous Q24H  . ciprofloxacin  400 mg Intravenous Q12H  . enoxaparin  80 mg Subcutaneous Q24H  . feeding supplement  237 mL Oral TID WC  . insulin aspart  0-20 Units Subcutaneous TID WC  . insulin aspart  0-5 Units Subcutaneous QHS  . insulin glargine  20 Units Subcutaneous Daily  . potassium chloride SA  20 mEq Oral TID  . sodium chloride  10 mL Intracatheter Q12H  . sodium chloride      . sodium chloride      . sodium chloride      . vancomycin  1,000 mg Intravenous Q12H  . DISCONTD: enoxaparin  40 mg Subcutaneous Q24H  . DISCONTD: vancomycin  1,000 mg Intravenous Q8H   Continuous:   . sodium chloride 125 mL/hr at 11/06/11 1025   LDJ:[id number], acetaminophen, ondansetron (ZOFRAN) IV, ondansetron, oxyCODONE-acetaminophen, promethazine, sodium chloride, zolpidem  Assesment: She has cellulitis of the leg which I think is better. She has febrile illness and is not totally clear what the source of fever is. She has had multiple blood cultures which are negative so far a urine culture which is negative so far. The only thing we really have  to go by is the fact that she does have some flank pain and I added Cipro yesterday. However she is still having fever. I'm going to have her get a CT if possible. Active Problems:  * No active hospital problems. *     Plan: CT of the chest abdomen and pelvis if possible. I will add tobramycin.    LOS: 2 days   [name] L 11/06/2011, 11:29 AM

## 2011-11-06 NOTE — Progress Notes (Signed)
ANTIBIOTIC CONSULT NOTE - follow up  Pharmacy Consult for Vancomycin Cipro added 11/16 Indication: cellulitis  Allergies  Allergen Reactions  . Contrast Media (Iodinated Diagnostic Agents) Swelling    Dye for cardiac cath.  . Pneumococcal Vaccines    Patient Measurements: Height: _0  (165.1 cm) Weight: 362 lb 10.5 oz (164.5 kg) IBW/kg (Calculated) : 57  Adjusted Body Weight : 88.2 KG  Vital Signs: Temp: 100.8 F (38.2 C) (11/17 0605) Temp src: Oral (11/17 0605) BP: 163/101 mmHg (11/17 0605) Pulse Rate: 92  (11/17 0605) Intake/Output from previous day: 11/16 0701 - 11/17 0700 In: 8250 [P.O.:1060; I.V.:2575; IV Piggyback:400] Out: 1400 [IBBCW:8889] Intake/Output from this shift:    Labs:  [facility] 11/06/11 0509 11/05/11 0429 11/04/11 1302  WBC 5.8 7.6 8.2  HGB 9.8* 9.5* 9.5*  PLT 337 359 351  LABCREA -- -- --  CREATININE 1.36* 1.20* 1.13*   Estimated Creatinine Clearance: 82.5 ml/min (by C-G formula based on Cr of 1.36).   Basename 11/06/11 0509  VANCOTROUGH 25.5*  VANCOPEAK --  [name] --  GENTTROUGH --  GENTPEAK --  GENTRANDOM --  TOBRATROUGH --  TOBRAPEAK --  TOBRARND --  AMIKACINPEAK --  AMIKACINTROU --  AMIKACIN --    Microbiology: Recent Results (from the past 720 hour(s))  CULTURE, BLOOD (ROUTINE X 2)     Status: Normal   Collection Time   10/26/11  6:31 PM      Component Value Range Status Comment   Specimen Description RIGHT ANTECUBITAL   Final    Special Requests BOTTLES DRAWN AEROBIC AND ANAEROBIC [place]   Final    Culture NO GROWTH 5 DAYS   Final    Report Status 10/31/2011 FINAL   Final   CULTURE, BLOOD (ROUTINE X 2)     Status: Normal   Collection Time   10/26/11  6:41 PM      Component Value Range Status Comment   Specimen Description BLOOD RIGHT WRIST DRAWN BY RN   Final    Special Requests BOTTLES DRAWN AEROBIC ONLY 5CC   Final    Culture NO GROWTH 5 DAYS   Final    Report Status 10/31/2011 FINAL   Final    Medical  History: Past Medical History  Diagnosis Date  . Diabetes mellitus   . Chronic pain   . Lymph edema    Medications:  Prescriptions prior to admission  Medication Sig Dispense Refill  . Acetaminophen (RA FEVER REDUCER/PAIN RELIEVER PO) Take 1 tablet by mouth as needed. For fever       . dextrose 5 % SOLN 50 mL with cefTRIAXone 1 G SOLR 1 g Inject 1 g into the vein daily.  1000 mg  10  . heparin lock flush 100 UNIT/ML SOLN 2.5 mLs (250 Units total) by Intracatheter route once.  250 vial  0  . insulin lispro (HUMALOG) 100 UNIT/ML injection Inject 60 Units into the skin 2 (two) times daily.        Marland Kitchen oxyCODONE-acetaminophen (PERCOCET) 5-325 MG per tablet Take 1 tablet by mouth 4 (four) times daily as needed. For pain       . potassium chloride SA (K-DUR,KLOR-CON) 20 MEQ tablet Take 20 mEq by mouth 3 (three) times daily.        . sodium chloride 0.9 % SOLN 500 mL with vancomycin 1000 MG SOLR 1,500 mg Inject 1,500 mg into the vein daily.  1500 mg  10  . torsemide (DEMADEX) 100 MG tablet Take 50 mg by  mouth 2 (two) times daily.         Assessment: Trough level above goal SCr rising slightly (? vanco induced) Recent outpatient Vancomycin regimen completed.  Goal of Therapy:  Vancomycin trough level 10-15 mcg/ml  Plan:  Reduce Vancomycin to 1063m IV every 12 hrs Cipro 4049miv q12hrs F/U micro data as appropriate Labs per protocol  HaHart Robinsons 11/06/2011,8:36 AM

## 2011-11-06 NOTE — Progress Notes (Signed)
ANTIBIOTIC CONSULT NOTE - follow up  Pharmacy Consult for Tobramycin (added 11/17) due to ongoing fevers and possible pyelonephritis Also on: Vancomycin, Cipro, Rocephin  D/W Dr [name]  Indication: fevers, possible pyelonephritis, cellulitis  Allergies  Allergen Reactions  . Contrast Media (Iodinated Diagnostic Agents) Swelling    Dye for cardiac cath.  . Pneumococcal Vaccines    Patient Measurements: Height: _0  (165.1 cm) Weight: 362 lb 10.5 oz (164.5 kg) IBW/kg (Calculated) : 57  Adjusted Body Weight : 88.2 KG  Vital Signs: Temp: 102.4 F (39.1 C) (11/17 0932) Temp src: Oral (11/17 0932) BP: 163/101 mmHg (11/17 0605) Pulse Rate: 92  (11/17 0605) Intake/Output from previous day: 11/16 0701 - 11/17 0700 In: 4492 [P.O.:1060; I.V.:2575; IV Piggyback:400] Out: 1400 [Urine:1400] Intake/Output from this shift: Total I/O In: 20 [I.V.:20] Out: 600 [Urine:600]  Labs:  [facility] 11/06/11 0509 11/05/11 0429 11/04/11 1302  WBC 5.8 7.6 8.2  HGB 9.8* 9.5* 9.5*  PLT 337 359 351  LABCREA -- -- --  CREATININE 1.36* 1.20* 1.13*   Estimated Creatinine Clearance: 82.5 ml/min (by C-G formula based on Cr of 1.36).   Basename 11/06/11 0509  VANCOTROUGH 25.5*  VANCOPEAK --  [name] --  GENTTROUGH --  GENTPEAK --  GENTRANDOM --  TOBRATROUGH --  TOBRAPEAK --  TOBRARND --  AMIKACINPEAK --  AMIKACINTROU --  AMIKACIN --    Microbiology: Recent Results (from the past 720 hour(s))  CULTURE, BLOOD (ROUTINE X 2)     Status: Normal   Collection Time   10/26/11  6:31 PM      Component Value Range Status Comment   Specimen Description RIGHT ANTECUBITAL   Final    Special Requests BOTTLES DRAWN AEROBIC AND ANAEROBIC [place]   Final    Culture NO GROWTH 5 DAYS   Final    Report Status 10/31/2011 FINAL   Final   CULTURE, BLOOD (ROUTINE X 2)     Status: Normal   Collection Time   10/26/11  6:41 PM      Component Value Range Status Comment   Specimen Description BLOOD RIGHT WRIST  DRAWN BY RN   Final    Special Requests BOTTLES DRAWN AEROBIC ONLY 5CC   Final    Culture NO GROWTH 5 DAYS   Final    Report Status 10/31/2011 FINAL   Final   CULTURE, BLOOD (ROUTINE X 2)     Status: Normal (Preliminary result)   Collection Time   11/06/11 10:40 AM      Component Value Range Status Comment   Specimen Description BLOOD   Final    Special Requests Normal   Final    Culture PENDING   Incomplete    Report Status PENDING   Incomplete   CULTURE, BLOOD (ROUTINE X 2)     Status: Normal (Preliminary result)   Collection Time   11/06/11 11:19 AM      Component Value Range Status Comment   Specimen Description BLOOD   Final    Special Requests Normal   Final    Culture PENDING   Incomplete    Report Status PENDING   Incomplete    Medical History: Past Medical History  Diagnosis Date  . Diabetes mellitus   . Chronic pain   . Lymph edema    Medications:  Prescriptions prior to admission  Medication Sig Dispense Refill  . Acetaminophen (RA FEVER REDUCER/PAIN RELIEVER PO) Take 1 tablet by mouth as needed. For fever       .  dextrose 5 % SOLN 50 mL with cefTRIAXone 1 G SOLR 1 g Inject 1 g into the vein daily.  1000 mg  10  . heparin lock flush 100 UNIT/ML SOLN 2.5 mLs (250 Units total) by Intracatheter route once.  250 vial  0  . insulin lispro (HUMALOG) 100 UNIT/ML injection Inject 60 Units into the skin 2 (two) times daily.        Marland Kitchen oxyCODONE-acetaminophen (PERCOCET) 5-325 MG per tablet Take 1 tablet by mouth 4 (four) times daily as needed. For pain       . potassium chloride SA (K-DUR,KLOR-CON) 20 MEQ tablet Take 20 mEq by mouth 3 (three) times daily.        . sodium chloride 0.9 % SOLN 500 mL with vancomycin 1000 MG SOLR 1,500 mg Inject 1,500 mg into the vein daily.  1500 mg  10  . torsemide (DEMADEX) 100 MG tablet Take 50 mg by mouth 2 (two) times daily.         Assessment: SCr slowly rising and will now be on two potentially nephrotoxic ABX.. Will monitor  closely Fevers, source unclear Morbidly obese  Goal of Therapy:  Eradicate infection.  Plan:  Tobramycin 46m/kg of ADJUSTED body weight Random level tonight and graph per nomogram to assess clearance and adjust dosing if needed F/U cultures when available Narrow ABX regimen when feasible Labs per protocol, monitor SCr daily for now  HHart RobinsonsA 11/06/2011,12:00 PM

## 2011-11-07 DIAGNOSIS — R509 Fever, unspecified: Secondary | ICD-10-CM | POA: Diagnosis present

## 2011-11-07 LAB — URINE CULTURE
Colony Count: 8000
Culture  Setup Time: 201211172011

## 2011-11-07 LAB — GLUCOSE, CAPILLARY
Glucose-Capillary: 159 mg/dL — ABNORMAL HIGH (ref 70–99)
Glucose-Capillary: 199 mg/dL — ABNORMAL HIGH (ref 70–99)

## 2011-11-07 LAB — BASIC METABOLIC PANEL
BUN: 13 mg/dL (ref 6–23)
Chloride: 94 mEq/L — ABNORMAL LOW (ref 96–112)
Creatinine, Ser: 1.49 mg/dL — ABNORMAL HIGH (ref 0.50–1.10)
GFR calc Af Amer: 48 mL/min — ABNORMAL LOW (ref >90)
GFR calc non Af Amer: 41 mL/min — ABNORMAL LOW (ref >90)
Glucose, Bld: 164 mg/dL — ABNORMAL HIGH (ref 70–99)
Potassium: 4.1 mEq/L (ref 3.5–5.1)

## 2011-11-07 LAB — TOBRAMYCIN LEVEL, RANDOM: Tobramycin Rm: 4.1 ug/mL

## 2011-11-07 MED ORDER — METOPROLOL TARTRATE 1 MG/ML IV SOLN
5.0000 mg | Freq: Four times a day (QID) | INTRAVENOUS | Status: DC
  Administered 2011-11-07 – 2011-11-14 (×28): 5 mg via INTRAVENOUS
  Filled 2011-11-07 (×32): qty 5

## 2011-11-07 MED ORDER — PROMETHAZINE HCL 25 MG/ML IJ SOLN
12.5000 mg | INTRAMUSCULAR | Status: AC
  Administered 2011-11-07: 12.5 mg via INTRAVENOUS
  Filled 2011-11-07: qty 1

## 2011-11-07 MED ORDER — TOBRAMYCIN SULFATE 80 MG/2ML IJ SOLN
7.0000 mg/kg | INTRAVENOUS | Status: DC
  Administered 2011-11-07: 620 mg via INTRAVENOUS
  Filled 2011-11-07: qty 15.5

## 2011-11-07 MED ORDER — METOPROLOL TARTRATE 1 MG/ML IV SOLN
INTRAVENOUS | Status: AC
  Administered 2011-11-07: 5 mg via INTRAVENOUS
  Filled 2011-11-07: qty 5

## 2011-11-07 MED ORDER — SODIUM CHLORIDE 0.9 % IJ SOLN
INTRAMUSCULAR | Status: AC
  Administered 2011-11-07: 07:00:00
  Filled 2011-11-07: qty 3

## 2011-11-07 NOTE — Progress Notes (Addendum)
ANTIBIOTIC CONSULT NOTE - follow up  Pharmacy Consult for Tobramycin (added 11/17) due to ongoing fevers and possible pyelonephritis Also on: Vancomycin, Cipro, Rocephin  D/W Dr [name]  Indication: fevers, possible pyelonephritis, cellulitis  Allergies  Allergen Reactions  . Contrast Media (Iodinated Diagnostic Agents) Swelling    Dye for cardiac cath.  . Pneumococcal Vaccines    Patient Measurements: Height: _0  (165.1 cm) Weight: 366 lb 13.5 oz (166.4 kg) IBW/kg (Calculated) : 57  Adjusted Body Weight : 88.2 KG  Vital Signs: Temp: 102 F (38.9 C) (11/18 0719) Temp src: Oral (11/18 0533) BP: 177/109 mmHg (11/18 0533) Pulse Rate: 104  (11/18 0533) Intake/Output from previous day: 11/17 0701 - 11/18 0700 In: 4630.4 [P.O.:840; I.V.:3022.9; IV Piggyback:767.5] Out: 1225 [Urine:1225] Intake/Output from this shift:    Labs:  Basename 11/07/11 0632 11/06/11 0509 11/05/11 0429 11/04/11 1302  WBC -- 5.8 7.6 8.2  HGB -- 9.8* 9.5* 9.5*  PLT -- 337 359 351  LABCREA -- -- -- --  CREATININE 1.49* 1.36* 1.20* --   Estimated Creatinine Clearance: 75.9 ml/min (by C-G formula based on Cr of 1.49).   Basename 11/06/11 2353 11/06/11 0509  VANCOTROUGH -- 25.5*  VANCOPEAK -- --  [name] -- --  GENTTROUGH -- --  GENTPEAK -- --  GENTRANDOM -- --  TOBRATROUGH -- --  TOBRAPEAK -- --  TOBRARND 4.1 --  AMIKACINPEAK -- --  AMIKACINTROU -- --  AMIKACIN -- --    Microbiology: Recent Results (from the past 720 hour(s))  CULTURE, BLOOD (ROUTINE X 2)     Status: Normal   Collection Time   10/26/11  6:31 PM      Component Value Range Status Comment   Specimen Description RIGHT ANTECUBITAL   Final    Special Requests BOTTLES DRAWN AEROBIC AND ANAEROBIC [place]   Final    Culture NO GROWTH 5 DAYS   Final    Report Status 10/31/2011 FINAL   Final   CULTURE, BLOOD (ROUTINE X 2)     Status: Normal   Collection Time   10/26/11  6:41 PM      Component Value Range Status Comment   Specimen Description BLOOD RIGHT WRIST DRAWN BY RN   Final    Special Requests BOTTLES DRAWN AEROBIC ONLY 5CC   Final    Culture NO GROWTH 5 DAYS   Final    Report Status 10/31/2011 FINAL   Final   CULTURE, BLOOD (ROUTINE X 2)     Status: Normal (Preliminary result)   Collection Time   11/06/11 10:40 AM      Component Value Range Status Comment   Specimen Description BLOOD   Final    Special Requests Normal   Final    Culture NO GROWTH 1 DAY   Final    Report Status PENDING   Incomplete   CULTURE, BLOOD (ROUTINE X 2)     Status: Normal (Preliminary result)   Collection Time   11/06/11 11:19 AM      Component Value Range Status Comment   Specimen Description BLOOD   Final    Special Requests Normal   Final    Culture NO GROWTH 1 DAY   Final    Report Status PENDING   Incomplete   CULTURE, BLOOD (ROUTINE X 2)     Status: Normal (Preliminary result)   Collection Time   11/07/11  6:37 AM      Component Value Range Status Comment   Specimen Description BLOOD DRAWN BY  RN PICC LINE   Final    Special Requests     Final    Value: BOTTLES DRAWN AEROBIC AND ANAEROBIC 6CC EACH BOTTLE   Culture PENDING   Incomplete    Report Status PENDING   Incomplete    Medical History: Past Medical History  Diagnosis Date  . Diabetes mellitus   . Chronic pain   . Lymph edema    Medications:  Prescriptions prior to admission  Medication Sig Dispense Refill  . Acetaminophen (RA FEVER REDUCER/PAIN RELIEVER PO) Take 1 tablet by mouth as needed. For fever       . dextrose 5 % SOLN 50 mL with cefTRIAXone 1 G SOLR 1 g Inject 1 g into the vein daily.  1000 mg  10  . heparin lock flush 100 UNIT/ML SOLN 2.5 mLs (250 Units total) by Intracatheter route once.  250 vial  0  . insulin lispro (HUMALOG) 100 UNIT/ML injection Inject 60 Units into the skin 2 (two) times daily.        Marland Kitchen oxyCODONE-acetaminophen (PERCOCET) 5-325 MG per tablet Take 1 tablet by mouth 4 (four) times daily as needed. For pain       .  potassium chloride SA (K-DUR,KLOR-CON) 20 MEQ tablet Take 20 mEq by mouth 3 (three) times daily.        . sodium chloride 0.9 % SOLN 500 mL with vancomycin 1000 MG SOLR 1,500 mg Inject 1,500 mg into the vein daily.  1500 mg  10  . torsemide (DEMADEX) 100 MG tablet Take 50 mg by mouth 2 (two) times daily.         Assessment: SCr still rising and now on two potentially nephrotoxic ABX.. Will monitor closely Still spiking fevers despite 4 ABX, source unclear Morbidly obese Random Tobramycin level noted, suggests q36hr dosing most appropriate  Goal of Therapy:  Eradicate infection. Vancomycin trough level 15-20  Plan:  Tobramycin 76m/kg of ADJUSTED body weight q36 hrs F/U vancomycin trough level tomorrow am F/U cultures when available Narrow ABX regimen when feasible Labs per protocol, monitor SCr daily for now  HHart RobinsonsA 11/07/2011,8:11 AM

## 2011-11-07 NOTE — Progress Notes (Signed)
Subjective: She says she feels a little bit better today. She is now on 4 antibiotics and still having fever. She is still nauseated but perhaps a little bit less so  Objective: Vital signs in last 24 hours: Temp:  [100.4 F (38 C)-103 F (39.4 C)] 100.4 F (38 C) (11/18 1008) Pulse Rate:  [98-104] 102  (11/18 1008) Resp:  [18-20] 20  (11/18 1008) BP: (166-177)/(88-109) 166/90 mmHg (11/18 1008) SpO2:  [90 %-93 %] 93 % (11/18 1008) Weight:  [166.4 kg (366 lb 13.5 oz)] 366 lb 13.5 oz (166.4 kg) (11/18 0719) Weight change:  Last BM Date: 11/03/11  Intake/Output from previous day: 11/17 0701 - 11/18 0700 In: 4630.4 [P.O.:840; I.V.:3022.9; IV Piggyback:767.5] Out: 1225 [Urine:1225]  PHYSICAL EXAM General appearance: alert, cooperative, moderate distress and morbidly obese Resp: clear to auscultation bilaterally Cardio: regular rate and rhythm, S1, S2 normal, no murmur, click, rub or gallop GI: soft, non-tender; bowel sounds normal; no masses,  no organomegaly Extremities: Severe lymphedema  Lab Results:    Basic Metabolic Panel:  Basename 11/07/11 0632 11/06/11 0509  NA 128* 133*  K 4.1 3.9  CL 94* 98  CO2 26 28  GLUCOSE 164* 164*  BUN 13 12  CREATININE 1.49* 1.36*  CALCIUM 8.3* 8.5  MG -- --  PHOS -- --   Liver Function Tests:  Basename 11/04/11 1302  AST 31  ALT 26  ALKPHOS 205*  BILITOT 0.3  PROT 7.5  ALBUMIN 2.7*    Basename 11/04/11 1302  LIPASE 41  AMYLASE --   No results found for this basename: AMMONIA:2 in the last 72 hours CBC:  Basename 11/06/11 0509 11/05/11 0429  WBC 5.8 7.6  NEUTROABS 4.4 --  HGB 9.8* 9.5*  HCT 29.8* 28.7*  MCV 87.6 88.0  PLT 337 359   Cardiac Enzymes: No results found for this basename: CKTOTAL:3,CKMB:3,CKMBINDEX:3,TROPONINI:3 in the last 72 hours BNP: No results found for this basename: POCBNP:3 in the last 72 hours D-Dimer: No results found for this basename: DDIMER:2 in the last 72 hours CBG:  Basename  11/07/11 0735 11/06/11 2131 11/06/11 1717 11/06/11 1204 11/06/11 0804 11/05/11 2157  GLUCAP 159* 156* 170* 172* 171* 158*   Hemoglobin A1C: No results found for this basename: HGBA1C in the last 72 hours Fasting Lipid Panel: No results found for this basename: CHOL,HDL,LDLCALC,TRIG,CHOLHDL,LDLDIRECT in the last 72 hours Thyroid Function Tests: No results found for this basename: TSH,T4TOTAL,FREET4,T3FREE,THYROIDAB in the last 72 hours Anemia Panel: No results found for this basename: VITAMINB12,FOLATE,FERRITIN,TIBC,IRON,RETICCTPCT in the last 72 hours Coagulation: No results found for this basename: LABPROT:2,INR:2 in the last 72 hours Urine Drug Screen:  Alcohol Level: No results found for this basename: ETH:2 in the last 72 hours Urinalysis:  Misc. Labs:  ABGS No results found for this basename: PHART,PCO2,PO2ART,TCO2,HCO3 in the last 72 hours CULTURES Recent Results (from the past 240 hour(s))  CULTURE, BLOOD (ROUTINE X 2)     Status: Normal (Preliminary result)   Collection Time   11/06/11 10:40 AM      Component Value Range Status Comment   Specimen Description BLOOD   Final    Special Requests Normal   Final    Culture NO GROWTH 1 DAY   Final    Report Status PENDING   Incomplete   CULTURE, BLOOD (ROUTINE X 2)     Status: Normal (Preliminary result)   Collection Time   11/06/11 11:19 AM      Component Value Range Status Comment   Specimen  Description BLOOD   Final    Special Requests Normal   Final    Culture NO GROWTH 1 DAY   Final    Report Status PENDING   Incomplete   CLOSTRIDIUM DIFFICILE BY PCR     Status: Normal   Collection Time   11/07/11  5:43 AM      Component Value Range Status Comment   C difficile by pcr NEGATIVE  NEGATIVE  Final   CULTURE, BLOOD (ROUTINE X 2)     Status: Normal (Preliminary result)   Collection Time   11/07/11  6:37 AM      Component Value Range Status Comment   Specimen Description BLOOD DRAWN BY RN PICC LINE   Final    Special  Requests     Final    Value: BOTTLES DRAWN AEROBIC AND ANAEROBIC 6CC EACH BOTTLE   Culture PENDING   Incomplete    Report Status PENDING   Incomplete    Studies/Results: Ct Abdomen Pelvis Wo Contrast  11/06/2011  *RADIOLOGY REPORT*  Clinical Data:  Fever of unknown origin, nausea/vomiting  CT CHEST, ABDOMEN AND PELVIS WITHOUT CONTRAST  Technique:  Multidetector CT imaging of the chest, abdomen and pelvis was performed following the standard protocol without IV contrast.  Comparison:  Chest radiographs dated 11/04/2011  CT CHEST  Findings:  Motion degraded images.  Mild dependent bilateral lower lobe opacities, likely atelectasis. No pleural effusion or pneumothorax.  Status post right thyroidectomy.  Left thyroid is unremarkable.  Mild cardiomegaly.  No pericardial effusion.  Small prevascular lymph nodes.  No suspicious axillary lymphadenopathy.  Right subclavian PICC terminating in the lower SVC.  Mild degenerative changes of the thoracic spine.  IMPRESSION: Motion degraded images.  Suspected bilateral lower lobe atelectasis.  Mild cardiomegaly.  CT ABDOMEN AND PELVIS  Findings:  Evaluation is constrained by artifact related to body habitus and lack of intravenous contrast.  Liver, spleen, pancreas, and adrenal glands are grossly unremarkable.  Gallbladder is grossly unremarkable.  No definite inflammatory changes by CT.  No.  Kidneys are unremarkable.  No renal calculi or hydronephrosis.  No evidence of bowel obstruction.  Colonic diverticulosis, without associated inflammatory changes.  No evidence of abdominal aortic aneurysm.  Retroperitoneal/left pelvic lymphadenopathy, including:  --2.5 x 1.9 cm left para-aortic node (series 2/image 65) --2.4 x 2.0 cm left common iliac node (series 2/image 85) --3.2 x 1.6 cm left external iliac node (series 2/image 107)  No abdominopelvic ascites.  No evidence of intra-abdominal abscess.  Fat-containing umbilical hernia (series 2/image 95).  Status post hysterectomy.   No adnexal masses.  Bladder is unremarkable.  Soft tissue stranding in the left lateral abdominal wall (series 2/image 82), incompletely visualized.  Visualized osseous structures are within normal limits.  IMPRESSION: Evaluation is constrained by artifact related to body habitus and lack of intravenous contrast.  No evidence of intra-abdominal abscess.  Retroperitoneal/left pelvic lymphadenopathy, as described above.  Soft tissue stranding in the left lateral abdominal wall, incompletely visualized.  Original Report Authenticated By: Julian Hy, M.D.   Ct Chest Wo Contrast  11/06/2011  *RADIOLOGY REPORT*  Clinical Data:  Fever of unknown origin, nausea/vomiting  CT CHEST, ABDOMEN AND PELVIS WITHOUT CONTRAST  Technique:  Multidetector CT imaging of the chest, abdomen and pelvis was performed following the standard protocol without IV contrast.  Comparison:  Chest radiographs dated 11/04/2011  CT CHEST  Findings:  Motion degraded images.  Mild dependent bilateral lower lobe opacities, likely atelectasis. No pleural effusion or pneumothorax.  Status post right thyroidectomy.  Left thyroid is unremarkable.  Mild cardiomegaly.  No pericardial effusion.  Small prevascular lymph nodes.  No suspicious axillary lymphadenopathy.  Right subclavian PICC terminating in the lower SVC.  Mild degenerative changes of the thoracic spine.  IMPRESSION: Motion degraded images.  Suspected bilateral lower lobe atelectasis.  Mild cardiomegaly.  CT ABDOMEN AND PELVIS  Findings:  Evaluation is constrained by artifact related to body habitus and lack of intravenous contrast.  Liver, spleen, pancreas, and adrenal glands are grossly unremarkable.  Gallbladder is grossly unremarkable.  No definite inflammatory changes by CT.  No.  Kidneys are unremarkable.  No renal calculi or hydronephrosis.  No evidence of bowel obstruction.  Colonic diverticulosis, without associated inflammatory changes.  No evidence of abdominal aortic aneurysm.   Retroperitoneal/left pelvic lymphadenopathy, including:  --2.5 x 1.9 cm left para-aortic node (series 2/image 65) --2.4 x 2.0 cm left common iliac node (series 2/image 85) --3.2 x 1.6 cm left external iliac node (series 2/image 107)  No abdominopelvic ascites.  No evidence of intra-abdominal abscess.  Fat-containing umbilical hernia (series 2/image 95).  Status post hysterectomy.  No adnexal masses.  Bladder is unremarkable.  Soft tissue stranding in the left lateral abdominal wall (series 2/image 82), incompletely visualized.  Visualized osseous structures are within normal limits.  IMPRESSION: Evaluation is constrained by artifact related to body habitus and lack of intravenous contrast.  No evidence of intra-abdominal abscess.  Retroperitoneal/left pelvic lymphadenopathy, as described above.  Soft tissue stranding in the left lateral abdominal wall, incompletely visualized.  Original Report Authenticated By: Julian Hy, M.D.    Medications:  Prior to Admission:  Prescriptions prior to admission  Medication Sig Dispense Refill  . Acetaminophen (RA FEVER REDUCER/PAIN RELIEVER PO) Take 1 tablet by mouth as needed. For fever       . dextrose 5 % SOLN 50 mL with cefTRIAXone 1 G SOLR 1 g Inject 1 g into the vein daily.  1000 mg  10  . heparin lock flush 100 UNIT/ML SOLN 2.5 mLs (250 Units total) by Intracatheter route once.  250 vial  0  . insulin lispro (HUMALOG) 100 UNIT/ML injection Inject 60 Units into the skin 2 (two) times daily.        Marland Kitchen oxyCODONE-acetaminophen (PERCOCET) 5-325 MG per tablet Take 1 tablet by mouth 4 (four) times daily as needed. For pain       . potassium chloride SA (K-DUR,KLOR-CON) 20 MEQ tablet Take 20 mEq by mouth 3 (three) times daily.        . sodium chloride 0.9 % SOLN 500 mL with vancomycin 1000 MG SOLR 1,500 mg Inject 1,500 mg into the vein daily.  1500 mg  10  . torsemide (DEMADEX) 100 MG tablet Take 50 mg by mouth 2 (two) times daily.         Scheduled:   .  cefTRIAXone (ROCEPHIN) IV  1 g Intravenous Q24H  . ciprofloxacin  400 mg Intravenous Q12H  . enoxaparin  80 mg Subcutaneous Q24H  . feeding supplement  237 mL Oral TID WC  . insulin aspart  0-20 Units Subcutaneous TID WC  . insulin aspart  0-5 Units Subcutaneous QHS  . insulin aspart  5 Units Subcutaneous TID WC  . insulin glargine  20 Units Subcutaneous Daily  . metoprolol  5 mg Intravenous Q6H  . potassium chloride SA  20 mEq Oral TID  . sodium chloride  10 mL Intracatheter Q12H  . sodium chloride      .  sodium chloride      . tobramycin  7 mg/kg (Order-Specific) Intravenous Q36H  . vancomycin  1,000 mg Intravenous Q12H  . DISCONTD: tobramycin  7 mg/kg (Order-Specific) Intravenous Q24H   Continuous:   . sodium chloride 125 mL/hr at 11/07/11 0310   YEL:[id number], acetaminophen, ondansetron (ZOFRAN) IV, ondansetron, oxyCODONE-acetaminophen, promethazine, sodium chloride, zolpidem, DISCONTD: acetaminophen, DISCONTD: acetaminophen  Assesment: She has febrile illness with no positive cultures so far. I think she may have pyelonephritis based on her symptoms. She has had an episode of cellulitis of the leg. She has morbid obesity. She has lymphedema. She has been extremely nauseated but that is slightly better her CT scan did not show a definite cause of her fever Active Problems:  * No active hospital problems. *     Plan: Continue his for antibiotics and all of her other treatments she has made some improvement    LOS: 3 days   Summit Arroyave L 11/07/2011, 10:19 AM

## 2011-11-08 DIAGNOSIS — R197 Diarrhea, unspecified: Secondary | ICD-10-CM

## 2011-11-08 DIAGNOSIS — R509 Fever, unspecified: Secondary | ICD-10-CM

## 2011-11-08 DIAGNOSIS — R112 Nausea with vomiting, unspecified: Secondary | ICD-10-CM

## 2011-11-08 DIAGNOSIS — K219 Gastro-esophageal reflux disease without esophagitis: Secondary | ICD-10-CM

## 2011-11-08 LAB — GLUCOSE, CAPILLARY: Glucose-Capillary: 152 mg/dL — ABNORMAL HIGH (ref 70–99)

## 2011-11-08 LAB — BASIC METABOLIC PANEL
BUN: 16 mg/dL (ref 6–23)
Calcium: 8.2 mg/dL — ABNORMAL LOW (ref 8.4–10.5)
GFR calc Af Amer: 41 mL/min — ABNORMAL LOW (ref >90)
GFR calc non Af Amer: 35 mL/min — ABNORMAL LOW (ref >90)
Potassium: 4.4 mEq/L (ref 3.5–5.1)
Sodium: 133 mEq/L — ABNORMAL LOW (ref 135–145)

## 2011-11-08 MED ORDER — TOBRAMYCIN SULFATE 80 MG/2ML IJ SOLN
7.0000 mg/kg | INTRAVENOUS | Status: DC
  Administered 2011-11-09: 620 mg via INTRAVENOUS
  Filled 2011-11-08: qty 15.5

## 2011-11-08 MED ORDER — VANCOMYCIN HCL 1000 MG IV SOLR
1500.0000 mg | INTRAVENOUS | Status: DC
  Administered 2011-11-09: 1500 mg via INTRAVENOUS
  Filled 2011-11-08 (×2): qty 1500

## 2011-11-08 MED ORDER — SODIUM CHLORIDE 0.9 % IJ SOLN
INTRAMUSCULAR | Status: AC
  Filled 2011-11-08: qty 3

## 2011-11-08 NOTE — Progress Notes (Signed)
ANTIBIOTIC CONSULT NOTE - follow up  Pharmacy Consult for Tobramycin (added 11/17) due to ongoing fevers and possible pyelonephritis Also on: Vancomycin, Cipro, Rocephin  D/W Dr [name]  Indication: fevers, possible pyelonephritis, cellulitis  Allergies  Allergen Reactions  . Contrast Media (Iodinated Diagnostic Agents) Swelling    Dye for cardiac cath.  . Pneumococcal Vaccines    Patient Measurements: Height: _0  (165.1 cm) Weight: 367 lb 11.6 oz (166.8 kg) IBW/kg (Calculated) : 57  Adjusted Body Weight : 88.2 KG  Vital Signs: Temp: 99.3 F (37.4 C) (11/19 0719) Temp src: Oral (11/19 0719) BP: 148/91 mmHg (11/19 0719) Pulse Rate: 92  (11/19 0636) Intake/Output from previous day: 11/18 0701 - 11/19 0700 In: 6178.9 [P.O.:1320; I.V.:4048.9; IV Piggyback:450] Out: 800 [Urine:800] Intake/Output from this shift:    Labs:  Basename 11/08/11 0551 11/07/11 0632 11/06/11 0509  WBC -- -- 5.8  HGB -- -- 9.8*  PLT -- -- 337  LABCREA -- -- --  CREATININE 1.70* 1.49* 1.36*   Estimated Creatinine Clearance: 66.6 ml/min (by C-G formula based on Cr of 1.7).   Basename 11/08/11 0823 11/06/11 2353 11/06/11 0509  VANCOTROUGH 32.6* -- 25.5*  VANCOPEAK -- -- --  [name] -- -- --  GENTTROUGH -- -- --  GENTPEAK -- -- --  GENTRANDOM -- -- --  TOBRATROUGH -- -- --  TOBRAPEAK -- -- --  TOBRARND -- 4.1 --  AMIKACINPEAK -- -- --  AMIKACINTROU -- -- --  AMIKACIN -- -- --    Microbiology: Recent Results (from the past 720 hour(s))  CULTURE, BLOOD (ROUTINE X 2)     Status: Normal   Collection Time   10/26/11  6:31 PM      Component Value Range Status Comment   Specimen Description RIGHT ANTECUBITAL   Final    Special Requests BOTTLES DRAWN AEROBIC AND ANAEROBIC [place]   Final    Culture NO GROWTH 5 DAYS   Final    Report Status 10/31/2011 FINAL   Final   CULTURE, BLOOD (ROUTINE X 2)     Status: Normal   Collection Time   10/26/11  6:41 PM      Component Value Range Status  Comment   Specimen Description BLOOD RIGHT WRIST DRAWN BY RN   Final    Special Requests BOTTLES DRAWN AEROBIC ONLY 5CC   Final    Culture NO GROWTH 5 DAYS   Final    Report Status 10/31/2011 FINAL   Final   URINE CULTURE     Status: Normal   Collection Time   11/05/11  4:13 PM      Component Value Range Status Comment   Specimen Description URINE, CLEAN CATCH   Final    Special Requests NONE   Final    Setup Time [id number]   Final    Colony Count 8,000 COLONIES/ML   Final    Culture INSIGNIFICANT GROWTH   Final    Report Status 11/07/2011 FINAL   Final   CULTURE, BLOOD (ROUTINE X 2)     Status: Normal (Preliminary result)   Collection Time   11/06/11 10:40 AM      Component Value Range Status Comment   Specimen Description BLOOD   Final    Special Requests Normal   Final    Culture NO GROWTH 1 DAY   Final    Report Status PENDING   Incomplete   CULTURE, BLOOD (ROUTINE X 2)     Status: Normal (Preliminary result)   Collection  Time   11/06/11 11:19 AM      Component Value Range Status Comment   Specimen Description BLOOD   Final    Special Requests Normal   Final    Culture NO GROWTH 1 DAY   Final    Report Status PENDING   Incomplete   CLOSTRIDIUM DIFFICILE BY PCR     Status: Normal   Collection Time   11/07/11  5:43 AM      Component Value Range Status Comment   C difficile by pcr NEGATIVE  NEGATIVE  Final   CULTURE, BLOOD (ROUTINE X 2)     Status: Normal (Preliminary result)   Collection Time   11/07/11  6:37 AM      Component Value Range Status Comment   Specimen Description BLOOD DRAWN BY RN PICC LINE   Final    Special Requests     Final    Value: BOTTLES DRAWN AEROBIC AND ANAEROBIC 6CC EACH BOTTLE   Culture PENDING   Incomplete    Report Status PENDING   Incomplete    Medical History: Past Medical History  Diagnosis Date  . Diabetes mellitus   . Chronic pain   . Lymph edema    Medications:  Prescriptions prior to admission  Medication Sig Dispense  Refill  . Acetaminophen (RA FEVER REDUCER/PAIN RELIEVER PO) Take 1 tablet by mouth as needed. For fever       . dextrose 5 % SOLN 50 mL with cefTRIAXone 1 G SOLR 1 g Inject 1 g into the vein daily.  1000 mg  10  . heparin lock flush 100 UNIT/ML SOLN 2.5 mLs (250 Units total) by Intracatheter route once.  250 vial  0  . insulin lispro (HUMALOG) 100 UNIT/ML injection Inject 60 Units into the skin 2 (two) times daily.        Marland Kitchen oxyCODONE-acetaminophen (PERCOCET) 5-325 MG per tablet Take 1 tablet by mouth 4 (four) times daily as needed. For pain       . potassium chloride SA (K-DUR,KLOR-CON) 20 MEQ tablet Take 20 mEq by mouth 3 (three) times daily.        . sodium chloride 0.9 % SOLN 500 mL with vancomycin 1000 MG SOLR 1,500 mg Inject 1,500 mg into the vein daily.  1500 mg  10  . torsemide (DEMADEX) 100 MG tablet Take 50 mg by mouth 2 (two) times daily.         Assessment: SCr still rising and now on two potentially nephrotoxic ABX.. Will monitor closely Still spiking fevers despite 4 ABX, source unclear Morbidly obese Vancomycin trough level above goal Worsening renal fxn  Goal of Therapy:  Eradicate infection. Vancomycin trough level 15-20  Plan: Change vancomycin to 156m iv q24hrs Change Tobramycin 73mkg of ADJUSTED body weight q48 hrs due to rising SCr F/U cultures when available Narrow ABX regimen when feasible Labs per protocol, monitor SCr daily for now  HaHart Robinsons 11/08/2011,10:18 AM

## 2011-11-08 NOTE — Progress Notes (Signed)
Subjective: She says she feels better. Her temperature is much improved. She is still nauseated but not quite as badly.  Objective: Vital signs in last 24 hours: Temp:  [99.3 F (37.4 C)-102.3 F (39.1 C)] 99.3 F (37.4 C) (11/19 0719) Pulse Rate:  [87-102] 92  (11/19 0636) Resp:  [20-24] 22  (11/19 0636) BP: (148-223)/(84-112) 148/91 mmHg (11/19 0719) SpO2:  [93 %-98 %] 97 % (11/19 0636) Weight:  [166.8 kg (367 lb 11.6 oz)] 367 lb 11.6 oz (166.8 kg) (11/19 0719) Weight change:  Last BM Date: 11/08/11  Intake/Output from previous day: 11/18 0701 - 11/19 0700 In: 6178.9 [P.O.:1320; I.V.:4048.9; IV Piggyback:450] Out: 800 [Urine:800]  PHYSICAL EXAM General appearance: alert, cooperative, moderate distress and morbidly obese Resp: clear to auscultation bilaterally Cardio: regular rate and rhythm, S1, S2 normal, no murmur, click, rub or gallop GI: soft, non-tender; bowel sounds normal; no masses,  no organomegaly Extremities: extremities normal, atraumatic, no cyanosis or edema  Lab Results:    Basic Metabolic Panel:  Basename 11/08/11 0551 11/07/11 0632  NA 133* 128*  K 4.4 4.1  CL 100 94*  CO2 23 26  GLUCOSE 106* 164*  BUN 16 13  CREATININE 1.70* 1.49*  CALCIUM 8.2* 8.3*  MG -- --  PHOS -- --   Liver Function Tests: No results found for this basename: AST:2,ALT:2,ALKPHOS:2,BILITOT:2,PROT:2,ALBUMIN:2 in the last 72 hours No results found for this basename: LIPASE:2,AMYLASE:2 in the last 72 hours No results found for this basename: AMMONIA:2 in the last 72 hours CBC:  Basename 11/06/11 0509  WBC 5.8  NEUTROABS 4.4  HGB 9.8*  HCT 29.8*  MCV 87.6  PLT 337   Cardiac Enzymes: No results found for this basename: CKTOTAL:3,CKMB:3,CKMBINDEX:3,TROPONINI:3 in the last 72 hours BNP: No results found for this basename: POCBNP:3 in the last 72 hours D-Dimer: No results found for this basename: DDIMER:2 in the last 72 hours CBG:  Basename 11/08/11 0717 11/07/11  1655 11/07/11 1148 11/07/11 0735 11/06/11 2131 11/06/11 1717  GLUCAP 102* 130* 199* 159* 156* 170*   Hemoglobin A1C: No results found for this basename: HGBA1C in the last 72 hours Fasting Lipid Panel: No results found for this basename: CHOL,HDL,LDLCALC,TRIG,CHOLHDL,LDLDIRECT in the last 72 hours Thyroid Function Tests: No results found for this basename: TSH,T4TOTAL,FREET4,T3FREE,THYROIDAB in the last 72 hours Anemia Panel: No results found for this basename: VITAMINB12,FOLATE,FERRITIN,TIBC,IRON,RETICCTPCT in the last 72 hours Coagulation: No results found for this basename: LABPROT:2,INR:2 in the last 72 hours Urine Drug Screen:  Alcohol Level: No results found for this basename: ETH:2 in the last 72 hours Urinalysis:  Misc. Labs:  ABGS No results found for this basename: PHART,PCO2,PO2ART,TCO2,HCO3 in the last 72 hours CULTURES Recent Results (from the past 240 hour(s))  URINE CULTURE     Status: Normal   Collection Time   11/05/11  4:13 PM      Component Value Range Status Comment   Specimen Description URINE, CLEAN CATCH   Final    Special Requests NONE   Final    Setup Time [id number]   Final    Colony Count 8,000 COLONIES/ML   Final    Culture INSIGNIFICANT GROWTH   Final    Report Status 11/07/2011 FINAL   Final   CULTURE, BLOOD (ROUTINE X 2)     Status: Normal (Preliminary result)   Collection Time   11/06/11 10:40 AM      Component Value Range Status Comment   Specimen Description BLOOD   Final    Special Requests  Normal   Final    Culture NO GROWTH 1 DAY   Final    Report Status PENDING   Incomplete   CULTURE, BLOOD (ROUTINE X 2)     Status: Normal (Preliminary result)   Collection Time   11/06/11 11:19 AM      Component Value Range Status Comment   Specimen Description BLOOD   Final    Special Requests Normal   Final    Culture NO GROWTH 1 DAY   Final    Report Status PENDING   Incomplete   CLOSTRIDIUM DIFFICILE BY PCR     Status: Normal   Collection  Time   11/07/11  5:43 AM      Component Value Range Status Comment   C difficile by pcr NEGATIVE  NEGATIVE  Final   CULTURE, BLOOD (ROUTINE X 2)     Status: Normal (Preliminary result)   Collection Time   11/07/11  6:37 AM      Component Value Range Status Comment   Specimen Description BLOOD DRAWN BY RN PICC LINE   Final    Special Requests     Final    Value: BOTTLES DRAWN AEROBIC AND ANAEROBIC 6CC EACH BOTTLE   Culture PENDING   Incomplete    Report Status PENDING   Incomplete    Studies/Results: Ct Abdomen Pelvis Wo Contrast  11/06/2011  *RADIOLOGY REPORT*  Clinical Data:  Fever of unknown origin, nausea/vomiting  CT CHEST, ABDOMEN AND PELVIS WITHOUT CONTRAST  Technique:  Multidetector CT imaging of the chest, abdomen and pelvis was performed following the standard protocol without IV contrast.  Comparison:  Chest radiographs dated 11/04/2011  CT CHEST  Findings:  Motion degraded images.  Mild dependent bilateral lower lobe opacities, likely atelectasis. No pleural effusion or pneumothorax.  Status post right thyroidectomy.  Left thyroid is unremarkable.  Mild cardiomegaly.  No pericardial effusion.  Small prevascular lymph nodes.  No suspicious axillary lymphadenopathy.  Right subclavian PICC terminating in the lower SVC.  Mild degenerative changes of the thoracic spine.  IMPRESSION: Motion degraded images.  Suspected bilateral lower lobe atelectasis.  Mild cardiomegaly.  CT ABDOMEN AND PELVIS  Findings:  Evaluation is constrained by artifact related to body habitus and lack of intravenous contrast.  Liver, spleen, pancreas, and adrenal glands are grossly unremarkable.  Gallbladder is grossly unremarkable.  No definite inflammatory changes by CT.  No.  Kidneys are unremarkable.  No renal calculi or hydronephrosis.  No evidence of bowel obstruction.  Colonic diverticulosis, without associated inflammatory changes.  No evidence of abdominal aortic aneurysm.  Retroperitoneal/left pelvic  lymphadenopathy, including:  --2.5 x 1.9 cm left para-aortic node (series 2/image 65) --2.4 x 2.0 cm left common iliac node (series 2/image 85) --3.2 x 1.6 cm left external iliac node (series 2/image 107)  No abdominopelvic ascites.  No evidence of intra-abdominal abscess.  Fat-containing umbilical hernia (series 2/image 95).  Status post hysterectomy.  No adnexal masses.  Bladder is unremarkable.  Soft tissue stranding in the left lateral abdominal wall (series 2/image 82), incompletely visualized.  Visualized osseous structures are within normal limits.  IMPRESSION: Evaluation is constrained by artifact related to body habitus and lack of intravenous contrast.  No evidence of intra-abdominal abscess.  Retroperitoneal/left pelvic lymphadenopathy, as described above.  Soft tissue stranding in the left lateral abdominal wall, incompletely visualized.  Original Report Authenticated By: Julian Hy, M.D.   Ct Chest Wo Contrast  11/06/2011  *RADIOLOGY REPORT*  Clinical Data:  Fever of unknown origin,  nausea/vomiting  CT CHEST, ABDOMEN AND PELVIS WITHOUT CONTRAST  Technique:  Multidetector CT imaging of the chest, abdomen and pelvis was performed following the standard protocol without IV contrast.  Comparison:  Chest radiographs dated 11/04/2011  CT CHEST  Findings:  Motion degraded images.  Mild dependent bilateral lower lobe opacities, likely atelectasis. No pleural effusion or pneumothorax.  Status post right thyroidectomy.  Left thyroid is unremarkable.  Mild cardiomegaly.  No pericardial effusion.  Small prevascular lymph nodes.  No suspicious axillary lymphadenopathy.  Right subclavian PICC terminating in the lower SVC.  Mild degenerative changes of the thoracic spine.  IMPRESSION: Motion degraded images.  Suspected bilateral lower lobe atelectasis.  Mild cardiomegaly.  CT ABDOMEN AND PELVIS  Findings:  Evaluation is constrained by artifact related to body habitus and lack of intravenous contrast.  Liver,  spleen, pancreas, and adrenal glands are grossly unremarkable.  Gallbladder is grossly unremarkable.  No definite inflammatory changes by CT.  No.  Kidneys are unremarkable.  No renal calculi or hydronephrosis.  No evidence of bowel obstruction.  Colonic diverticulosis, without associated inflammatory changes.  No evidence of abdominal aortic aneurysm.  Retroperitoneal/left pelvic lymphadenopathy, including:  --2.5 x 1.9 cm left para-aortic node (series 2/image 65) --2.4 x 2.0 cm left common iliac node (series 2/image 85) --3.2 x 1.6 cm left external iliac node (series 2/image 107)  No abdominopelvic ascites.  No evidence of intra-abdominal abscess.  Fat-containing umbilical hernia (series 2/image 95).  Status post hysterectomy.  No adnexal masses.  Bladder is unremarkable.  Soft tissue stranding in the left lateral abdominal wall (series 2/image 82), incompletely visualized.  Visualized osseous structures are within normal limits.  IMPRESSION: Evaluation is constrained by artifact related to body habitus and lack of intravenous contrast.  No evidence of intra-abdominal abscess.  Retroperitoneal/left pelvic lymphadenopathy, as described above.  Soft tissue stranding in the left lateral abdominal wall, incompletely visualized.  Original Report Authenticated By: Julian Hy, M.D.    Medications:  Scheduled:   . cefTRIAXone (ROCEPHIN) IV  1 g Intravenous Q24H  . ciprofloxacin  400 mg Intravenous Q12H  . enoxaparin  80 mg Subcutaneous Q24H  . feeding supplement  237 mL Oral TID WC  . insulin aspart  0-20 Units Subcutaneous TID WC  . insulin aspart  0-5 Units Subcutaneous QHS  . insulin aspart  5 Units Subcutaneous TID WC  . insulin glargine  20 Units Subcutaneous Daily  . metoprolol  5 mg Intravenous Q6H  . potassium chloride SA  20 mEq Oral TID  . promethazine  12.5 mg Intravenous NOW  . sodium chloride  10 mL Intracatheter Q12H  . sodium chloride      . tobramycin  7 mg/kg (Order-Specific)  Intravenous Q36H  . vancomycin  1,000 mg Intravenous Q12H   Continuous:   . sodium chloride 125 mL/hr at 11/08/11 0609   FPB:[id number], acetaminophen, ondansetron (ZOFRAN) IV, ondansetron, oxyCODONE-acetaminophen, promethazine, sodium chloride, zolpidem  Assesment: I think she has pyelonephritis despite the fact that her urine culture is negative. She has flank pain high fever nausea and vomiting and had some indication that there may be something going on in the left flank on her CT. She is improving now. Principal Problem:  *Fever of unknown origin Active Problems:  Lymphedema of lower extremity  Cellulitis of left leg  Diabetes mellitus  Morbid obesity    Plan: Continue with current antibiotics.    LOS: 4 days   [name] L 11/08/2011, 8:52 AM

## 2011-11-09 ENCOUNTER — Inpatient Hospital Stay (HOSPITAL_COMMUNITY)
Admission: RE | Admit: 2011-11-09 | Payer: PRIVATE HEALTH INSURANCE | Source: Ambulatory Visit | Admitting: Physical Therapy

## 2011-11-09 ENCOUNTER — Encounter (HOSPITAL_COMMUNITY): Payer: Self-pay | Admitting: Urgent Care

## 2011-11-09 ENCOUNTER — Inpatient Hospital Stay (HOSPITAL_COMMUNITY): Payer: PRIVATE HEALTH INSURANCE

## 2011-11-09 LAB — GLUCOSE, CAPILLARY
Glucose-Capillary: 176 mg/dL — ABNORMAL HIGH (ref 70–99)
Glucose-Capillary: 104 mg/dL — ABNORMAL HIGH (ref 70–99)

## 2011-11-09 LAB — BASIC METABOLIC PANEL
CO2: 24 mEq/L (ref 19–32)
Calcium: 8.2 mg/dL — ABNORMAL LOW (ref 8.4–10.5)
GFR calc Af Amer: 33 mL/min — ABNORMAL LOW (ref >90)
GFR calc non Af Amer: 29 mL/min — ABNORMAL LOW (ref >90)
Sodium: 133 mEq/L — ABNORMAL LOW (ref 135–145)

## 2011-11-09 MED ORDER — FLORA-Q PO CAPS
1.0000 | ORAL_CAPSULE | Freq: Every day | ORAL | Status: DC
  Administered 2011-11-09 – 2011-11-22 (×15): 1 via ORAL
  Filled 2011-11-09 (×14): qty 1

## 2011-11-09 MED ORDER — ONDANSETRON HCL 4 MG/2ML IJ SOLN
4.0000 mg | Freq: Four times a day (QID) | INTRAMUSCULAR | Status: DC
  Administered 2011-11-09 – 2011-11-22 (×44): 4 mg via INTRAVENOUS
  Filled 2011-11-09 (×89): qty 2

## 2011-11-09 MED ORDER — PANTOPRAZOLE SODIUM 40 MG IV SOLR
40.0000 mg | Freq: Two times a day (BID) | INTRAVENOUS | Status: DC
  Administered 2011-11-09 – 2011-11-14 (×10): 40 mg via INTRAVENOUS
  Filled 2011-11-09 (×12): qty 40

## 2011-11-09 NOTE — Progress Notes (Signed)
ANTIBIOTIC CONSULT NOTE - follow up  Pharmacy Consult for Tobramycin (added 11/17) due to ongoing fevers and possible pyelonephritis Also on: Vancomycin, Cipro, Rocephin  D/W Dr [name]  Indication: fevers, possible pyelonephritis, cellulitis  Allergies  Allergen Reactions  . Contrast Media (Iodinated Diagnostic Agents) Swelling    Dye for cardiac cath.  . Pneumococcal Vaccines    Patient Measurements: Height: _0  (165.1 cm) Weight: 373 lb 14.4 oz (169.6 kg) IBW/kg (Calculated) : 57  Adjusted Body Weight : 88.2 KG  Vital Signs: Temp: 101 F (38.3 C) (11/20 0616) Temp src: Oral (11/20 0616) BP: 164/97 mmHg (11/20 0616) Pulse Rate: 85  (11/20 0616) Intake/Output from previous day: 11/19 0701 - 11/20 0700 In: 2160 [P.O.:600; I.V.:1310; IV Piggyback:250] Out: 400 [Urine:400] Intake/Output from this shift:    Labs:  Basename 11/09/11 0512 11/08/11 0551 11/07/11 0632  WBC -- -- --  HGB -- -- --  PLT -- -- --  LABCREA -- -- --  CREATININE 2.02* 1.70* 1.49*   Estimated Creatinine Clearance: 56.6 ml/min (by C-G formula based on Cr of 2.02).   Basename 11/08/11 0823 11/06/11 2353  VANCOTROUGH 32.6* --  VANCOPEAK -- --  [name] -- --  GENTTROUGH -- --  GENTPEAK -- --  GENTRANDOM -- --  TOBRATROUGH -- --  TOBRAPEAK -- --  TOBRARND -- 4.1  AMIKACINPEAK -- --  AMIKACINTROU -- --  AMIKACIN -- --    Microbiology: Recent Results (from the past 720 hour(s))  CULTURE, BLOOD (ROUTINE X 2)     Status: Normal   Collection Time   10/26/11  6:31 PM      Component Value Range Status Comment   Specimen Description RIGHT ANTECUBITAL   Final    Special Requests BOTTLES DRAWN AEROBIC AND ANAEROBIC [place]   Final    Culture NO GROWTH 5 DAYS   Final    Report Status 10/31/2011 FINAL   Final   CULTURE, BLOOD (ROUTINE X 2)     Status: Normal   Collection Time   10/26/11  6:41 PM      Component Value Range Status Comment   Specimen Description BLOOD RIGHT WRIST DRAWN BY RN    Final    Special Requests BOTTLES DRAWN AEROBIC ONLY 5CC   Final    Culture NO GROWTH 5 DAYS   Final    Report Status 10/31/2011 FINAL   Final   URINE CULTURE     Status: Normal   Collection Time   11/05/11  4:13 PM      Component Value Range Status Comment   Specimen Description URINE, CLEAN CATCH   Final    Special Requests NONE   Final    Setup Time [id number]   Final    Colony Count 8,000 COLONIES/ML   Final    Culture INSIGNIFICANT GROWTH   Final    Report Status 11/07/2011 FINAL   Final   CULTURE, BLOOD (ROUTINE X 2)     Status: Normal (Preliminary result)   Collection Time   11/06/11 10:40 AM      Component Value Range Status Comment   Specimen Description BLOOD PICC LINE DRAWN BY RN   Final    Special Requests BOTTLES DRAWN AEROBIC AND ANAEROBIC 6CC   Final    Culture NO GROWTH 2 DAYS   Final    Report Status PENDING   Incomplete   CULTURE, BLOOD (ROUTINE X 2)     Status: Normal (Preliminary result)   Collection Time   11/06/11  11:19 AM      Component Value Range Status Comment   Specimen Description BLOOD PICC LINE DRAWN BY RN   Final    Special Requests BOTTLES DRAWN AEROBIC AND ANAEROBIC 6CC   Final    Culture NO GROWTH 2 DAYS   Final    Report Status PENDING   Incomplete   CLOSTRIDIUM DIFFICILE BY PCR     Status: Normal   Collection Time   11/07/11  5:43 AM      Component Value Range Status Comment   C difficile by pcr NEGATIVE  NEGATIVE  Final   CULTURE, BLOOD (ROUTINE X 2)     Status: Normal (Preliminary result)   Collection Time   11/07/11  6:37 AM      Component Value Range Status Comment   Specimen Description BLOOD DRAWN BY RN PICC LINE   Final    Special Requests     Final    Value: BOTTLES DRAWN AEROBIC AND ANAEROBIC 6CC EACH BOTTLE   Culture NO GROWTH 1 DAY   Final    Report Status PENDING   Incomplete    Medical History: Past Medical History  Diagnosis Date  . Diabetes mellitus   . Chronic pain   . Lymph edema    Medications:  Prescriptions  prior to admission  Medication Sig Dispense Refill  . Acetaminophen (RA FEVER REDUCER/PAIN RELIEVER PO) Take 1 tablet by mouth as needed. For fever       . dextrose 5 % SOLN 50 mL with cefTRIAXone 1 G SOLR 1 g Inject 1 g into the vein daily.  1000 mg  10  . heparin lock flush 100 UNIT/ML SOLN 2.5 mLs (250 Units total) by Intracatheter route once.  250 vial  0  . insulin lispro (HUMALOG) 100 UNIT/ML injection Inject 60 Units into the skin 2 (two) times daily.        Marland Kitchen oxyCODONE-acetaminophen (PERCOCET) 5-325 MG per tablet Take 1 tablet by mouth 4 (four) times daily as needed. For pain       . potassium chloride SA (K-DUR,KLOR-CON) 20 MEQ tablet Take 20 mEq by mouth 3 (three) times daily.        . sodium chloride 0.9 % SOLN 500 mL with vancomycin 1000 MG SOLR 1,500 mg Inject 1,500 mg into the vein daily.  1500 mg  10  . torsemide (DEMADEX) 100 MG tablet Take 50 mg by mouth 2 (two) times daily.         Assessment: SCr still rising and on two potentially nephrotoxic ABX Will monitor closely Still spiking fevers despite 4 ABX, source unclear Morbidly obese Worsening renal fxn Cultures negative so far  Goal of Therapy:  Eradicate infection. Vancomycin trough level 15-20  Plan: vancomycin to 1540m iv q24hrs Tobramycin 783mkg of ADJUSTED body weight q48 hrs due to rising SCr F/U cultures  Narrow ABX regimen when feasible Labs per protocol, monitor SCr daily for now  HaHart Robinsons 11/09/2011,8:52 AM

## 2011-11-09 NOTE — Consult Note (Signed)
Pt has nausea and vomiting and diarrhea. HAVING 2 BMS A DAY. DIARRHEA MOST LIKELY 2o to antibiotic associated diarrhea. C DIFF PCR NEG. HOWEVER, she remains on broad spectrum ABx. WILL REASSESS NEED TO RE-TEST FOR  C DIFF ON A DAILY BASIS. THE ETIOLOGY OF HER NAUSEA AND VOMITING IS UNCLEAR. The diffrential diagnosis includes: GERD, gastroparesis, and meds, less likely H. Pylori gastritis. Limited CT eval due to body habitus-no obstruction. PT TOO HIGH RISK FOR ANESTHESIA AT [facility] DUE TO HER BMI OF 62. RECOMMEND EGD/TCS AS OP AT [facility] UNLESS PT MANIFESTS EVIDENCE FOR ACTIVE GIB. Discussed with pt, husband, and Dr. [name]. ATC and prn Zofran. PT UNABLE TO HAVE GES DUE TO DRY HEAVES.

## 2011-11-09 NOTE — Progress Notes (Signed)
UR Chart Review Completed  

## 2011-11-09 NOTE — Consult Note (Signed)
Referring Provider: Dr. [name] Primary Care Physician:  Alonza Bogus, MD Primary Gastroenterologist:  Dr. Oneida Alar Reason for Consultation:  Nausea and vomiting  HPI: [name] is a 45 y.o. female referred for nausea & vomiting.  She was treated and discharged him [facility] hospital 11/01/11 w/ cellulitis.  She tells me she was home 2 days and on Rocephin therapy, & she began to have severe nausea & vomiting.  She has not had anything to eat in over 1 week.  Vomiting TID every day.  Admitted w/ fever & chills.  Has been on intensive antibiotic therapy.  Current therapy includes Rocephin, Cipro, vancomycin, and tobramycin.  C/o profound weakness.  Diarrhea BID x 5 days.  Denies rectal bleeding or melena.  Denies abd pain or flank pain.  C/o dysuria.  No ill contacts. Was taking IBU 456m q4hrs for pain.  Denies any other NSAIDs.  Notes heartburn & indigestion 1 week ago.  Denies dysphagia or odynophagia.  Weight stable.  Poor appetite over past 7-10 days, otherwise normal.  Cellulitis suspected from chronic lymphedema & DM.  Blood sugars poorly controlled since she has been sick.  Prior to this her blood sugars have not been ideal either.  C diff PCR: Negative 11/18  11/06/2011 CT ABD/Pelvis without contrast-> Evaluation is constrained by artifact related to body habitus and lack of intravenous contrast.   No evidence of intra-abdominal abscess. Retroperitoneal/left pelvic lymphadenopathy.  Soft tissue stranding in the left lateral abdominal wall,  incompletely visualized.    Retroperitoneal/left pelvic lymphadenopathy, including:  --2.5 x 1.9 cm left para-aortic node (series 2/image 65)  --2.4 x 2.0 cm left common iliac node (series 2/image 85)  --3.2 x 1.6 cm left external iliac node (series 2/image 107)  No abdominopelvic ascites.  Fat-containing umbilical  hernia (series 2/image 95).  Status post hysterectomy. No adnexal masses.  Bladder is unremarkable.   CT Chest->Motion  degraded images.  Suspected bilateral lower lobe atelectasis.  Mild cardiomegaly.  Past Medical History  Diagnosis Date  . Diabetes mellitus   . Chronic pain   . Lymph edema   . Cellulitis   . Chiari malformation     s/p surgery  . S/P colonoscopy 05/26/2007    Dr. RLaural Goldensigmoid diverticulosis random biopsies benign  . CAD (coronary artery disease)   . Hypercholesterolemia   . Hypertension   . Asthma   . S/P endoscopy 05/01/2009    Dr. GPenelope Cooppill-induced esophageal ulcerations distal to midesophagus, 2 small ulcers in the antrum of the stomach    Past Surgical History  Procedure Date  . Cesarean section      x 2  . Abdominal hysterectomy   . Tonsillectomy   . Adenoidectomy   . Thyroidectomy, partial   . Craniectomy suboccipital w/ cervical laminectomy / chiari     Prior to Admission medications   Medication Sig Start Date End Date Taking? Authorizing Provider  Acetaminophen (RA FEVER REDUCER/PAIN RELIEVER PO) Take 1 tablet by mouth as needed. For fever    Yes Historical Provider, MD  dextrose 5 % SOLN 50 mL with cefTRIAXone 1 G SOLR 1 g Inject 1 g into the vein daily. 11/01/11  Yes EAlonza Bogus heparin lock flush 100 UNIT/ML SOLN 2.5 mLs (250 Units total) by Intracatheter route once. 11/01/11  Yes EAlonza Bogus insulin lispro (HUMALOG) 100 UNIT/ML injection Inject 60 Units into the skin 2 (two) times daily.     Yes Historical Provider, MD  oxyCODONE-acetaminophen (PERCOCET)  5-325 MG per tablet Take 1 tablet by mouth 4 (four) times daily as needed. For pain    Yes Historical Provider, MD  potassium chloride SA (K-DUR,KLOR-CON) 20 MEQ tablet Take 20 mEq by mouth 3 (three) times daily.     Yes Historical Provider, MD  sodium chloride 0.9 % SOLN 500 mL with vancomycin 1000 MG SOLR 1,500 mg Inject 1,500 mg into the vein daily. 11/01/11  Yes Alonza Bogus  torsemide (DEMADEX) 100 MG tablet Take 50 mg by mouth 2 (two) times daily.     Yes Historical Provider, MD     Current Facility-Administered Medications  Medication Dose Route Frequency Provider Last Rate Last Dose  . 0.9 %  sodium chloride infusion   Intravenous Continuous Alonza Bogus 125 mL/hr at 11/09/11 0231    . acetaminophen (TYLENOL) tablet 650 mg  650 mg Oral Q4H PRN Alonza Bogus   650 mg at 11/09/11 6438   Or  . acetaminophen (TYLENOL) suppository 650 mg  650 mg Rectal Q4H PRN Alonza Bogus      . cefTRIAXone (ROCEPHIN) 1 g in dextrose 5 % 50 mL IVPB  1 g Intravenous Q24H Alonza Bogus   1 g at 11/08/11 2205  . ciprofloxacin (CIPRO) IVPB 400 mg  400 mg Intravenous Q12H Scott A Hall, PHARMD   400 mg at 11/08/11 2318  . enoxaparin (LOVENOX) injection 80 mg  80 mg Subcutaneous Q24H Ena Dawley, PHARMD   80 mg at 11/08/11 2206  . feeding supplement (ENSURE CLINICAL STRENGTH) liquid 237 mL  237 mL Oral TID WC Edward L Hawkins   237 mL at 11/08/11 1747  . insulin aspart (novoLOG) injection 0-20 Units  0-20 Units Subcutaneous TID WC Alonza Bogus   4 Units at 11/09/11 3818  . insulin aspart (novoLOG) injection 0-5 Units  0-5 Units Subcutaneous QHS Alonza Bogus      . insulin aspart (novoLOG) injection 5 Units  5 Units Subcutaneous TID WC Alonza Bogus   5 Units at 11/09/11 4037  . insulin glargine (LANTUS) injection 20 Units  20 Units Subcutaneous Daily Alonza Bogus   20 Units at 11/08/11 1104  . metoprolol (LOPRESSOR) injection 5 mg  5 mg Intravenous Q6H Edward L Hawkins   5 mg at 11/09/11 5436  . ondansetron (ZOFRAN) tablet 4 mg  4 mg Oral Q6H PRN Alonza Bogus   4 mg at 11/09/11 0126   Or  . ondansetron [facility]) injection 4 mg  4 mg Intravenous Q6H PRN Alonza Bogus   4 mg at 11/08/11 0677  . oxyCODONE-acetaminophen (PERCOCET) 5-325 MG per tablet 1 tablet  1 tablet Oral QID PRN Alonza Bogus   1 tablet at 11/09/11 0042  . potassium chloride SA (K-DUR,KLOR-CON) CR tablet 20 mEq  20 mEq Oral TID Alonza Bogus   20 mEq at 11/08/11 2206  . promethazine  (PHENERGAN) injection 12.5 mg  12.5 mg Intravenous Q4H PRN Edward L Hawkins   12.5 mg at 11/08/11 1148  . sodium chloride 0.9 % injection 10 mL  10 mL Intracatheter Q12H Edward L Hawkins   10 mL at 11/08/11 2207  . sodium chloride 0.9 % injection 10 mL  10 mL Intracatheter PRN Alonza Bogus   10 mL at 11/07/11 1838  . sodium chloride 0.9 % injection           . tobramycin (NEBCIN) 620 mg in dextrose 5 % 100 mL IVPB  7 mg/kg (Order-Specific) Intravenous Q48H Scott A Hall, PHARMD      . vancomycin (VANCOCIN) 1,500 mg in sodium chloride 0.9 % 500 mL IVPB  1,500 mg Intravenous Q24H Scott A Hall, PHARMD      . zolpidem (AMBIEN) tablet 5 mg  5 mg Oral QHS PRN Alonza Bogus      . DISCONTD: tobramycin (NEBCIN) 620 mg in dextrose 5 % 100 mL IVPB  7 mg/kg (Order-Specific) Intravenous Q36H Ena Dawley, PHARMD   620 mg at 11/07/11 2202    Allergies as of 11/04/2011 - Review Complete 11/04/2011  Allergen Reaction Noted  . Contrast media (iodinated diagnostic agents) Swelling 11/04/2011  . Pneumococcal vaccines  11/04/2011    Family History  Problem Relation Age of Onset  . Colon cancer Mother 64  . Stroke Mother 69  . Coronary artery disease Mother    History   Social History  . Marital Status: Married    Spouse Name: N/A    Number of Children: 3  . Years of Education: N/A   Occupational History  . Not on file.   Social History Main Topics  . Smoking status: Never Smoker   . Smokeless tobacco: Not on file  . Alcohol Use: No  . Drug Use: No  . Sexually Active: Not on file   Other Topics Concern  . 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 Resp: Denies dyspnea at rest, dyspnea with exercise, cough, sputum, wheezing, coughing up blood, and pleurisy. GI: Denies vomiting blood,  jaundice, and fecal incontinence.   Denies dysphagia or odynophagia. GU : See history of present illness.  MS: Notes limitation of movement with chronic lymphedema. She tells me she is able to ambulate and take care of her disabled husband.  Derm: Denies rash, itching, dry skin, hives, moles, warts, or unhealing ulcers.  Psych: Denies depression, anxiety, memory loss, suicidal ideation, hallucinations, paranoia, and confusion. Heme: Denies bruising, bleeding. Neuro:  Denies any headaches, dizziness, paresthesias. Endo:  See history of present illness  Physical Exam: Vital signs in last 24 hours: Body mass index is 62.22 kg/(m^2). Temp:  [98.7 F (37.1 C)-101 F (38.3 C)] 101 F (38.3 C) (11/20 0616) Pulse Rate:  [74-88] 85  (11/20 0616) Resp:  [16-22] 16  (11/20 0616) BP: (144-169)/(91-98) 164/97 mmHg (11/20 0616) SpO2:  [95 %-98 %] 95 % (11/20 0616) Weight:  [373 lb 14.4 oz (169.6 kg)-374 lb 5.5 oz (169.8 kg)] 373 lb 14.4 oz (169.6 kg) (11/20 0500) Last BM Date: 11/09/11 General:   Alert,  Well-developed, morbidly obese black female, very pleasant and cooperative in NAD. Head:  Normocephalic and atraumatic. Eyes:  Sclera clear, no icterus.   Conjunctiva pink. Ears:  Normal auditory acuity. Nose:  No deformity, discharge,  or lesions. Mouth:  No deformity or lesions.  Oropharynx pink & moist. Neck:  Supple; no masses or thyromegaly. Lungs:  Clear throughout to auscultation.   No wheezes, crackles, or rhonchi. No acute distress. Heart:  Regular rate and rhythm; no murmurs, clicks, rubs,  or gallops. Abdomen:  Soft, obese, nontender and nondistended. No masses, hepatosplenomegaly or hernias noted. Exam is limited given patient's body habitus. Normal bowel sounds, without guarding, and without rebound.   Rectal:  Deferred..   Pulses:  Normal pulses noted. Extremities:  Severe lymphedema  bilateral lower extremities.  Neurologic:  Alert and  oriented x4;  grossly normal  neurologically. Skin:  Intact without significant lesions or rashes. Cervical Nodes:  No significant cervical adenopathy. Psych:  Alert and cooperative. Normal mood and affect.  Intake/Output from previous day: 11/19 0701 - 11/20 0700 In: 2160 [P.O.:600; I.V.:1310; IV Piggyback:250] Out: 400 [Urine:400]  Lab Results: CBC   Ref. Range 11/06/2011 05:09  WBC Latest Range: 4.0-10.5 K/uL 5.8  RBC Latest Range: 3.87-5.11 MIL/uL 3.40 (L)  HGB Latest Range: 12.0-15.0 g/dL 9.8 (L)  HCT Latest Range: 36.0-46.0 % 29.8 (L)  MCV Latest Range: 78.0-100.0 fL 87.6  MCH Latest Range: 26.0-34.0 pg 28.8  MCHC Latest Range: 30.0-36.0 g/dL 32.9  RDW Latest Range: 11.5-15.5 % 15.4  Platelets Latest Range: 150-400 K/uL 337   LFTS 11/04/11: Alb 2.7 AST 31 ALT 26 ALP 205 TB 0.3  BMET  Basename 11/09/11 0512 11/08/11 0551 11/07/11 0632  NA 133* 133* 128*  K 4.8 4.4 4.1  CL 101 100 94*  CO2 _0 GLUCOSE 160* 106* 164*  BUN _1 CREATININE 2.02* 1.70* 1.49*  CALCIUM 8.2* 8.2* 8.3*  Impression: [name] is a 45 y.o. female admitted with fever of unknown origin, nausea, vomiting, diarrhea that began after antibiotic treatment for cellulitis. C. difficile PCR has been negative. CT scan of abdomen and pelvis without contrast showed nonspecific adenopathy, but given her body habitus and lack of IV contrast exam findings are quite nonspecific. Diarrhea could be related to antibiotic therapy alone given the amount of antibiotics she has received vs pseudomembranous colitis. Stool studies may not be reliable at this point.  Her nausea and vomiting is likely due to diabetic gastroparesis versus gastritis versus peptic ulcer disease.     Plan: 1. Add twice a day PPI 2. Minimize use of antibiotics as appropriate 3. Add Flora Q. once daily 4. Maximize glycemic control 5. Agree with Zofran 4 mg when necessary every 6 hours and when necessary Phenergan 6. Gastric emptying study-pt is NPO  for study 7. Outpatient GI Consult at [facility] for EGD given BMI 62. 8. Colonoscopy at [facility] due June 2013 given family history of colon cancer, but may be able to be done at the same time as EGD given persistent diarrhea 9. Consider consult w/ Dr Tressie Stalker regarding RETROPERITONEAL/LEFT PELVIC ADENOPATHY.   LOS: 5 days   Vickey Huger  11/09/2011, 9:49 AM

## 2011-11-09 NOTE — Progress Notes (Signed)
Subjective: She still has nausea he says she's had more diarrhea. She is negative for C. difficile. Her cultures are negative so far. Her fever is better but not gone.  Objective: Vital signs in last 24 hours: Temp:  [98.7 F (37.1 C)-101 F (38.3 C)] 101 F (38.3 C) (11/20 0616) Pulse Rate:  [74-88] 85  (11/20 0616) Resp:  [16-22] 16  (11/20 0616) BP: (144-169)/(91-98) 164/97 mmHg (11/20 0616) SpO2:  [95 %-98 %] 95 % (11/20 0616) Weight:  [169.6 kg (373 lb 14.4 oz)-169.8 kg (374 lb 5.5 oz)] 373 lb 14.4 oz (169.6 kg) (11/20 0500) Weight change: 0.4 kg (14.1 oz) Last BM Date: 11/09/11  Intake/Output from previous day: 11/19 0701 - 11/20 0700 In: 2160 [P.O.:600; I.V.:1310; IV Piggyback:250] Out: 400 [Urine:400]  PHYSICAL EXAM General appearance: alert, cooperative, mild distress and morbidly obese Resp: clear to auscultation bilaterally Cardio: regular rate and rhythm, S1, S2 normal, no murmur, click, rub or gallop GI: soft, non-tender; bowel sounds normal; no masses,  no organomegaly Extremities: Lymphedema  Lab Results:    Basic Metabolic Panel:  Basename 11/09/11 0512 11/08/11 0551  NA 133* 133*  K 4.8 4.4  CL 101 100  CO2 24 23  GLUCOSE 160* 106*  BUN 21 16  CREATININE 2.02* 1.70*  CALCIUM 8.2* 8.2*  MG -- --  PHOS -- --   Liver Function Tests: No results found for this basename: AST:2,ALT:2,ALKPHOS:2,BILITOT:2,PROT:2,ALBUMIN:2 in the last 72 hours No results found for this basename: LIPASE:2,AMYLASE:2 in the last 72 hours No results found for this basename: AMMONIA:2 in the last 72 hours CBC: No results found for this basename: WBC:2,NEUTROABS:2,HGB:2,HCT:2,MCV:2,PLT:2 in the last 72 hours Cardiac Enzymes: No results found for this basename: CKTOTAL:3,CKMB:3,CKMBINDEX:3,TROPONINI:3 in the last 72 hours BNP: No results found for this basename: POCBNP:3 in the last 72 hours D-Dimer: No results found for this basename: DDIMER:2 in the last 72  hours CBG:  Basename 11/09/11 0759 11/08/11 2100 11/08/11 1633 11/08/11 1126 11/08/11 0717 11/07/11 1655  GLUCAP 176* 152* 134* 95 102* 130*   Hemoglobin A1C: No results found for this basename: HGBA1C in the last 72 hours Fasting Lipid Panel: No results found for this basename: CHOL,HDL,LDLCALC,TRIG,CHOLHDL,LDLDIRECT in the last 72 hours Thyroid Function Tests: No results found for this basename: TSH,T4TOTAL,FREET4,T3FREE,THYROIDAB in the last 72 hours Anemia Panel: No results found for this basename: VITAMINB12,FOLATE,FERRITIN,TIBC,IRON,RETICCTPCT in the last 72 hours Coagulation: No results found for this basename: LABPROT:2,INR:2 in the last 72 hours Urine Drug Screen:  Alcohol Level: No results found for this basename: ETH:2 in the last 72 hours Urinalysis:  Misc. Labs:  ABGS No results found for this basename: PHART,PCO2,PO2ART,TCO2,HCO3 in the last 72 hours CULTURES Recent Results (from the past 240 hour(s))  URINE CULTURE     Status: Normal   Collection Time   11/05/11  4:13 PM      Component Value Range Status Comment   Specimen Description URINE, CLEAN CATCH   Final    Special Requests NONE   Final    Setup Time [id number]   Final    Colony Count 8,000 COLONIES/ML   Final    Culture INSIGNIFICANT GROWTH   Final    Report Status 11/07/2011 FINAL   Final   CULTURE, BLOOD (ROUTINE X 2)     Status: Normal (Preliminary result)   Collection Time   11/06/11 10:40 AM      Component Value Range Status Comment   Specimen Description BLOOD PICC LINE DRAWN BY RN   Final  Special Requests BOTTLES DRAWN AEROBIC AND ANAEROBIC 6CC   Final    Culture NO GROWTH 2 DAYS   Final    Report Status PENDING   Incomplete   CULTURE, BLOOD (ROUTINE X 2)     Status: Normal (Preliminary result)   Collection Time   11/06/11 11:19 AM      Component Value Range Status Comment   Specimen Description BLOOD PICC LINE DRAWN BY RN   Final    Special Requests BOTTLES DRAWN AEROBIC AND  ANAEROBIC 6CC   Final    Culture NO GROWTH 2 DAYS   Final    Report Status PENDING   Incomplete   CLOSTRIDIUM DIFFICILE BY PCR     Status: Normal   Collection Time   11/07/11  5:43 AM      Component Value Range Status Comment   C difficile by pcr NEGATIVE  NEGATIVE  Final   CULTURE, BLOOD (ROUTINE X 2)     Status: Normal (Preliminary result)   Collection Time   11/07/11  6:37 AM      Component Value Range Status Comment   Specimen Description BLOOD DRAWN BY RN PICC LINE   Final    Special Requests     Final    Value: BOTTLES DRAWN AEROBIC AND ANAEROBIC 6CC EACH BOTTLE   Culture NO GROWTH 1 DAY   Final    Report Status PENDING   Incomplete    Studies/Results: No results found.  Medications:  Prior to Admission:  Prescriptions prior to admission  Medication Sig Dispense Refill  . Acetaminophen (RA FEVER REDUCER/PAIN RELIEVER PO) Take 1 tablet by mouth as needed. For fever       . dextrose 5 % SOLN 50 mL with cefTRIAXone 1 G SOLR 1 g Inject 1 g into the vein daily.  1000 mg  10  . heparin lock flush 100 UNIT/ML SOLN 2.5 mLs (250 Units total) by Intracatheter route once.  250 vial  0  . insulin lispro (HUMALOG) 100 UNIT/ML injection Inject 60 Units into the skin 2 (two) times daily.        Marland Kitchen oxyCODONE-acetaminophen (PERCOCET) 5-325 MG per tablet Take 1 tablet by mouth 4 (four) times daily as needed. For pain       . potassium chloride SA (K-DUR,KLOR-CON) 20 MEQ tablet Take 20 mEq by mouth 3 (three) times daily.        . sodium chloride 0.9 % SOLN 500 mL with vancomycin 1000 MG SOLR 1,500 mg Inject 1,500 mg into the vein daily.  1500 mg  10  . torsemide (DEMADEX) 100 MG tablet Take 50 mg by mouth 2 (two) times daily.         Scheduled:   . cefTRIAXone (ROCEPHIN) IV  1 g Intravenous Q24H  . ciprofloxacin  400 mg Intravenous Q12H  . enoxaparin  80 mg Subcutaneous Q24H  . feeding supplement  237 mL Oral TID WC  . insulin aspart  0-20 Units Subcutaneous TID WC  . insulin aspart  0-5  Units Subcutaneous QHS  . insulin aspart  5 Units Subcutaneous TID WC  . insulin glargine  20 Units Subcutaneous Daily  . metoprolol  5 mg Intravenous Q6H  . potassium chloride SA  20 mEq Oral TID  . sodium chloride  10 mL Intracatheter Q12H  . sodium chloride      . tobramycin  7 mg/kg (Order-Specific) Intravenous Q48H  . vancomycin  1,500 mg Intravenous Q24H  . DISCONTD: tobramycin  7  mg/kg (Order-Specific) Intravenous Q36H  . DISCONTD: vancomycin  1,000 mg Intravenous Q12H   Continuous:   . sodium chloride 125 mL/hr at 11/09/11 0231   UQJ:[id number], acetaminophen, ondansetron (ZOFRAN) IV, ondansetron, oxyCODONE-acetaminophen, promethazine, sodium chloride, zolpidem  Assesment: She has had nausea vomiting and fever. It is not totally clear what this is from. She's made slow progress but is still having trouble. Her cellulitis looks better. Principal Problem:  *Fever of unknown origin Active Problems:  Lymphedema of lower extremity  Cellulitis of left leg  Diabetes mellitus  Morbid obesity    Plan: I. have asked for GI consultation. She does have a positive for history of colon cancer. I would like to see what they think about her nausea and what else we can do about it    LOS: 5 days   Gaylyn Berish L 11/09/2011, 8:40 AM

## 2011-11-10 ENCOUNTER — Inpatient Hospital Stay (HOSPITAL_COMMUNITY): Payer: PRIVATE HEALTH INSURANCE

## 2011-11-10 ENCOUNTER — Ambulatory Visit (HOSPITAL_COMMUNITY): Payer: PRIVATE HEALTH INSURANCE | Admitting: Physical Therapy

## 2011-11-10 DIAGNOSIS — R7989 Other specified abnormal findings of blood chemistry: Secondary | ICD-10-CM

## 2011-11-10 DIAGNOSIS — R112 Nausea with vomiting, unspecified: Secondary | ICD-10-CM

## 2011-11-10 DIAGNOSIS — R509 Fever, unspecified: Secondary | ICD-10-CM

## 2011-11-10 LAB — BASIC METABOLIC PANEL
CO2: 23 mEq/L (ref 19–32)
Chloride: 103 mEq/L (ref 96–112)
Creatinine, Ser: 2.4 mg/dL — ABNORMAL HIGH (ref 0.50–1.10)
Glucose, Bld: 118 mg/dL — ABNORMAL HIGH (ref 70–99)
Sodium: 134 mEq/L — ABNORMAL LOW (ref 135–145)

## 2011-11-10 LAB — HEPATIC FUNCTION PANEL
ALT: 44 U/L — ABNORMAL HIGH (ref 0–35)
AST: 64 U/L — ABNORMAL HIGH (ref 0–37)
Albumin: 2 g/dL — ABNORMAL LOW (ref 3.5–5.2)
Indirect Bilirubin: 0.1 mg/dL — ABNORMAL LOW (ref 0.3–0.9)
Total Protein: 6.3 g/dL (ref 6.0–8.3)

## 2011-11-10 LAB — DIFFERENTIAL
Eosinophils Relative: 2 % (ref 0–5)
Lymphocytes Relative: 21 % (ref 12–46)
Lymphs Abs: 0.7 10*3/uL (ref 0.7–4.0)
Monocytes Absolute: 0.2 10*3/uL (ref 0.1–1.0)
Monocytes Relative: 7 % (ref 3–12)

## 2011-11-10 LAB — CBC
HCT: 28.6 % — ABNORMAL LOW (ref 36.0–46.0)
MCH: 28.1 pg (ref 26.0–34.0)
MCV: 87.5 fL (ref 78.0–100.0)
RBC: 3.27 MIL/uL — ABNORMAL LOW (ref 3.87–5.11)
WBC: 3.4 10*3/uL — ABNORMAL LOW (ref 4.0–10.5)

## 2011-11-10 LAB — GLUCOSE, CAPILLARY: Glucose-Capillary: 117 mg/dL — ABNORMAL HIGH (ref 70–99)

## 2011-11-10 MED ORDER — PHENOL 1.4 % MT LIQD
1.0000 | OROMUCOSAL | Status: DC | PRN
  Administered 2011-11-10: 1 via OROMUCOSAL
  Filled 2011-11-10: qty 177

## 2011-11-10 MED ORDER — METOCLOPRAMIDE HCL 5 MG/ML IJ SOLN
5.0000 mg | Freq: Three times a day (TID) | INTRAMUSCULAR | Status: DC
  Administered 2011-11-10 – 2011-11-22 (×44): 5 mg via INTRAVENOUS
  Filled 2011-11-10 (×55): qty 2

## 2011-11-10 MED ORDER — SODIUM POLYSTYRENE SULFONATE 15 GM/60ML PO SUSP
30.0000 g | Freq: Once | ORAL | Status: AC
  Administered 2011-11-10: 30 g via ORAL
  Filled 2011-11-10: qty 120

## 2011-11-10 MED ORDER — BOOST / RESOURCE BREEZE PO LIQD
1.0000 | Freq: Two times a day (BID) | ORAL | Status: DC
  Administered 2011-11-13 – 2011-11-15 (×3): 1 via ORAL
  Filled 2011-11-10 (×3): qty 1

## 2011-11-10 MED ORDER — FLUCONAZOLE 100 MG PO TABS
100.0000 mg | ORAL_TABLET | Freq: Every day | ORAL | Status: DC
  Administered 2011-11-10 – 2011-11-12 (×3): 100 mg via ORAL
  Filled 2011-11-10 (×3): qty 1

## 2011-11-10 NOTE — Progress Notes (Signed)
INITIAL ADULT NUTRITION ASSESSMENT Date: 11/10/2011   Time: 3:27 PM Reason for Assessment: Increased LOS  ASSESSMENT: Female 45 y.o.  Dx: Fever of unknown origin  Hx:  Past Medical History  Diagnosis Date  . Diabetes mellitus   . Chronic pain   . Lymph edema   . Cellulitis   . Chiari malformation     s/p surgery  . S/P colonoscopy 05/26/2007    Dr. Laural Golden sigmoid diverticulosis random biopsies benign  . CAD (coronary artery disease)   . Hypercholesterolemia   . Hypertension   . Asthma   . S/P endoscopy 05/01/2009    Dr. Penelope Coop pill-induced esophageal ulcerations distal to midesophagus, 2 small ulcers in the antrum of the stomach    Related Meds:  Scheduled Meds:   . ciprofloxacin  400 mg Intravenous Q12H  . enoxaparin  80 mg Subcutaneous Q24H  . feeding supplement  237 mL Oral TID WC  . Flora-Q  1 capsule Oral Daily  . insulin aspart  0-20 Units Subcutaneous TID WC  . insulin aspart  0-5 Units Subcutaneous QHS  . insulin aspart  5 Units Subcutaneous TID WC  . insulin glargine  20 Units Subcutaneous Daily  . metoCLOPramide (REGLAN) injection  5 mg Intravenous TID WC & HS  . metoprolol  5 mg Intravenous Q6H  . ondansetron  4 mg Intravenous QID  . pantoprazole (PROTONIX) IV  40 mg Intravenous Q12H  . sodium chloride  10 mL Intracatheter Q12H  . sodium polystyrene  30 g Oral Once  . DISCONTD: cefTRIAXone (ROCEPHIN) IV  1 g Intravenous Q24H  . DISCONTD: potassium chloride SA  20 mEq Oral TID  . DISCONTD: tobramycin  7 mg/kg (Order-Specific) Intravenous Q48H  . DISCONTD: vancomycin  1,500 mg Intravenous Q24H   Continuous Infusions:   . sodium chloride 125 mL/hr at 11/10/11 0611   PRN Meds:.acetaminophen, acetaminophen, ondansetron (ZOFRAN) IV, ondansetron, oxyCODONE-acetaminophen, promethazine, sodium chloride, zolpidem   Ht: _0  (165.1 cm)  Wt: 378 lb 15.5 oz (171.9 kg)  Ideal Wt: 57 kg  % Ideal Wt: 222%  Usual Wt: 378# % Usual Wt: 100%  Body mass index  is 63.06 kg/(m^2).  Food/Nutrition Related Hx: Pt admitted with fever of unknown origin after recent discharge from [facility] earlier this month. Pt has had poor appetite, due to nausea and vomiting; PO: 0-100%, with decline. Pt has been on a full liquid diet since 11/20, and on clear liquids from 11/15-11/20. Nursing reports pt will drink Ensure. Pt negative for C-diff. Gastric emptying study unable to be done, due to dry heaving.   Labs:  CBC    Component Value Date/Time   WBC 3.4* 11/10/2011 0602   RBC 3.27* 11/10/2011 0602   HGB 9.2* 11/10/2011 0602   HCT 28.6* 11/10/2011 0602   PLT 315 11/10/2011 0602   MCV 87.5 11/10/2011 0602   MCH 28.1 11/10/2011 0602   MCHC 32.2 11/10/2011 0602   RDW 16.4* 11/10/2011 0602   LYMPHSABS 0.7 11/10/2011 0602   MONOABS 0.2 11/10/2011 0602   EOSABS 0.1 11/10/2011 0602   BASOSABS 0.0 11/10/2011 0602     CMP     Component Value Date/Time   NA 134* 11/10/2011 0602   K 5.4* 11/10/2011 0602   CL 103 11/10/2011 0602   CO2 23 11/10/2011 0602   GLUCOSE 118* 11/10/2011 0602   BUN 23 11/10/2011 0602   CREATININE 2.40* 11/10/2011 0602   CALCIUM 8.3* 11/10/2011 0602   PROT 6.3 11/10/2011 0602   ALBUMIN  2.0* 11/10/2011 0602   AST 64* 11/10/2011 0602   ALT 44* 11/10/2011 0602   ALKPHOS 370* 11/10/2011 0602   BILITOT 0.2* 11/10/2011 0602   GFRNONAA 23* 11/10/2011 0602   GFRAA 27* 11/10/2011 0602     Lab Results  Component Value Date   HGBA1C  Value: 11.6 (NOTE)                                                                       According to the ADA Clinical Practice Recommendations for 2011, when HbA1c is used as a screening test:   >=6.5%   Diagnostic of Diabetes Mellitus           (if abnormal result  is confirmed)  5.7-6.4%   Increased risk of developing Diabetes Mellitus  References:Diagnosis and Classification of Diabetes Mellitus,Diabetes OZHY,8657,84(ONGEX 1):S62-S69 and Standards of Medical Care in         Diabetes - 2011,Diabetes BMWU,1324,40   (Suppl 1):S11-S61.* 05/28/2010   HGBA1C  Value: 9.5 (NOTE) The ADA recommends the following therapeutic goal for glycemic control related to Hgb A1c measurement: Goal of therapy: <6.5 Hgb A1c  Reference: American Diabetes Association: Clinical Practice Recommendations 2010, Diabetes Care, 2010, 33: (Suppl  1).* 04/29/2009   Lab Results  Component Value Date   LDLCALC  Value: 86        Total Cholesterol/HDL:CHD Risk Coronary Heart Disease Risk Table                     Men   Women  1/2 Average Risk   3.4   3.3  Average Risk       5.0   4.4  2 X Average Risk   9.6   7.1  3 X Average Risk  23.4   11.0        Use the calculated Patient Ratio above and the CHD Risk Table to determine the patient's CHD Risk.        ATP III CLASSIFICATION (LDL):  <100     mg/dL   Optimal  100-129  mg/dL   Near or Above                    Optimal  130-159  mg/dL   Borderline  160-189  mg/dL   High  >190     mg/dL   Very High 04/29/2009   CREATININE 2.40* 11/10/2011     Intake:   Intake/Output Summary (Last 24 hours) at 11/10/11 1542 Last data filed at 11/10/11 0845  Gross per 24 hour  Intake    720 ml  Output    300 ml  Net    420 ml     Diet Order: Full Liquid  Supplements/Tube Feeding: Ensure TID with meals  IVF:    sodium chloride Last Rate: 125 mL/hr at 11/10/11 1027    Estimated Nutritional Needs:   OZDG:6440-3474 kcals daily Protein:137-178 grams protein daily Fluid:2.6-2.9 L fluid daily  NUTRITION DIAGNOSIS: -Inadequate oral intake (NI-2.1).  Status: Ongoing  RELATED TO: nausea, vomiting, poor appetite  AS EVIDENCE BY: variable, declining intake (PO: 0-100%.   MONITORING/EVALUATION(Goals): 1) Pt will maintain weight of 378# 2) Pt will meet >75% of estimated energy needs  EDUCATION NEEDS: -  Education not appropriate at this time  INTERVENTION: Add Resource Breeze BID. Will follow for diet advancement.  In the long term, pt may benefit from outpatient nutritional counseling for diabetes and  weight management.   Dietitian #: [phone]  [place] approved criteria  -Non-severe (moderate) malnutrition in the context of acute illness or injury    Alene Mires 11/10/2011, 3:27 PM

## 2011-11-10 NOTE — Progress Notes (Signed)
Subjective: She says she feels somewhat better. She is still nauseated but not as badly. She has no new complaints.  Objective: Vital signs in last 24 hours: Temp:  [98.8 F (37.1 C)-103 F (39.4 C)] 98.8 F (37.1 C) (11/21 0522) Pulse Rate:  [80-86] 84  (11/21 0522) Resp:  [18-22] 18  (11/21 0522) BP: (145-170)/(77-94) 146/93 mmHg (11/21 0522) SpO2:  [97 %-100 %] 97 % (11/21 0522) Weight:  [171.9 kg (378 lb 15.5 oz)] 378 lb 15.5 oz (171.9 kg) (11/21 0522) Weight change: 5.1 kg (11 lb 3.9 oz) Last BM Date: 11/09/11  Intake/Output from previous day: 11/20 0701 - 11/21 0700 In: 2075.8 [P.O.:480; I.V.:895.8; IV Piggyback:700] Out: 300 [Urine:300]  PHYSICAL EXAM General appearance: alert, cooperative and morbidly obese Resp: clear to auscultation bilaterally Cardio: regular rate and rhythm, S1, S2 normal, no murmur, click, rub or gallop GI: soft, non-tender; bowel sounds normal; no masses,  no organomegaly Extremities: extremities normal, atraumatic, no cyanosis or edema  Lab Results:    Basic Metabolic Panel:  Basename 11/10/11 0602 11/09/11 0512  NA 134* 133*  K 5.4* 4.8  CL 103 101  CO2 23 24  GLUCOSE 118* 160*  BUN 23 21  CREATININE 2.40* 2.02*  CALCIUM 8.3* 8.2*  MG -- --  PHOS -- --   Liver Function Tests: No results found for this basename: AST:2,ALT:2,ALKPHOS:2,BILITOT:2,PROT:2,ALBUMIN:2 in the last 72 hours  Basename 11/09/11 0512  LIPASE --  AMYLASE 102   No results found for this basename: AMMONIA:2 in the last 72 hours CBC:  Basename 11/10/11 0602  WBC 3.4*  NEUTROABS 2.4  HGB 9.2*  HCT 28.6*  MCV 87.5  PLT 315   Cardiac Enzymes: No results found for this basename: CKTOTAL:3,CKMB:3,CKMBINDEX:3,TROPONINI:3 in the last 72 hours BNP: No results found for this basename: POCBNP:3 in the last 72 hours D-Dimer: No results found for this basename: DDIMER:2 in the last 72 hours CBG:  Basename 11/10/11 0755 11/09/11 2047 11/09/11 1653 11/09/11  1137 11/09/11 0759 11/08/11 2100  GLUCAP 117* 128* 116* 104* 176* 152*   Hemoglobin A1C: No results found for this basename: HGBA1C in the last 72 hours Fasting Lipid Panel: No results found for this basename: CHOL,HDL,LDLCALC,TRIG,CHOLHDL,LDLDIRECT in the last 72 hours Thyroid Function Tests: No results found for this basename: TSH,T4TOTAL,FREET4,T3FREE,THYROIDAB in the last 72 hours Anemia Panel: No results found for this basename: VITAMINB12,FOLATE,FERRITIN,TIBC,IRON,RETICCTPCT in the last 72 hours Coagulation: No results found for this basename: LABPROT:2,INR:2 in the last 72 hours Urine Drug Screen:  Alcohol Level: No results found for this basename: ETH:2 in the last 72 hours Urinalysis:  Misc. Labs:  ABGS No results found for this basename: PHART,PCO2,PO2ART,TCO2,HCO3 in the last 72 hours CULTURES Recent Results (from the past 240 hour(s))  URINE CULTURE     Status: Normal   Collection Time   11/05/11  4:13 PM      Component Value Range Status Comment   Specimen Description URINE, CLEAN CATCH   Final    Special Requests NONE   Final    Setup Time [id number]   Final    Colony Count 8,000 COLONIES/ML   Final    Culture INSIGNIFICANT GROWTH   Final    Report Status 11/07/2011 FINAL   Final   CULTURE, BLOOD (ROUTINE X 2)     Status: Normal (Preliminary result)   Collection Time   11/06/11 10:40 AM      Component Value Range Status Comment   Specimen Description BLOOD PICC LINE DRAWN BY RN  Final    Special Requests BOTTLES DRAWN AEROBIC AND ANAEROBIC 6CC   Final    Culture NO GROWTH 3 DAYS   Final    Report Status PENDING   Incomplete   CULTURE, BLOOD (ROUTINE X 2)     Status: Normal (Preliminary result)   Collection Time   11/06/11 11:19 AM      Component Value Range Status Comment   Specimen Description BLOOD PICC LINE DRAWN BY RN   Final    Special Requests BOTTLES DRAWN AEROBIC AND ANAEROBIC 6CC   Final    Culture NO GROWTH 3 DAYS   Final    Report Status  PENDING   Incomplete   CLOSTRIDIUM DIFFICILE BY PCR     Status: Normal   Collection Time   11/07/11  5:43 AM      Component Value Range Status Comment   C difficile by pcr NEGATIVE  NEGATIVE  Final   CULTURE, BLOOD (ROUTINE X 2)     Status: Normal (Preliminary result)   Collection Time   11/07/11  6:37 AM      Component Value Range Status Comment   Specimen Description BLOOD DRAWN BY RN PICC LINE   Final    Special Requests     Final    Value: BOTTLES DRAWN AEROBIC AND ANAEROBIC 6CC EACH BOTTLE   Culture NO GROWTH 2 DAYS   Final    Report Status PENDING   Incomplete    Studies/Results: No results found.  Medications:  Prior to Admission:  Prescriptions prior to admission  Medication Sig Dispense Refill  . Acetaminophen (RA FEVER REDUCER/PAIN RELIEVER PO) Take 1 tablet by mouth as needed. For fever       . dextrose 5 % SOLN 50 mL with cefTRIAXone 1 G SOLR 1 g Inject 1 g into the vein daily.  1000 mg  10  . heparin lock flush 100 UNIT/ML SOLN 2.5 mLs (250 Units total) by Intracatheter route once.  250 vial  0  . insulin lispro (HUMALOG) 100 UNIT/ML injection Inject 60 Units into the skin 2 (two) times daily.        Marland Kitchen oxyCODONE-acetaminophen (PERCOCET) 5-325 MG per tablet Take 1 tablet by mouth 4 (four) times daily as needed. For pain       . potassium chloride SA (K-DUR,KLOR-CON) 20 MEQ tablet Take 20 mEq by mouth 3 (three) times daily.        . sodium chloride 0.9 % SOLN 500 mL with vancomycin 1000 MG SOLR 1,500 mg Inject 1,500 mg into the vein daily.  1500 mg  10  . torsemide (DEMADEX) 100 MG tablet Take 50 mg by mouth 2 (two) times daily.         Scheduled:   . ciprofloxacin  400 mg Intravenous Q12H  . enoxaparin  80 mg Subcutaneous Q24H  . feeding supplement  237 mL Oral TID WC  . Flora-Q  1 capsule Oral Daily  . insulin aspart  0-20 Units Subcutaneous TID WC  . insulin aspart  0-5 Units Subcutaneous QHS  . insulin aspart  5 Units Subcutaneous TID WC  . insulin glargine  20  Units Subcutaneous Daily  . metoprolol  5 mg Intravenous Q6H  . ondansetron  4 mg Intravenous QID  . pantoprazole (PROTONIX) IV  40 mg Intravenous Q12H  . potassium chloride SA  20 mEq Oral TID  . sodium chloride  10 mL Intracatheter Q12H  . tobramycin  7 mg/kg (Order-Specific) Intravenous Q48H  .  DISCONTD: cefTRIAXone (ROCEPHIN) IV  1 g Intravenous Q24H  . DISCONTD: vancomycin  1,500 mg Intravenous Q24H   Continuous:   . sodium chloride 125 mL/hr at 11/10/11 0301   THY:[id number], acetaminophen, ondansetron (ZOFRAN) IV, ondansetron, oxyCODONE-acetaminophen, promethazine, sodium chloride, zolpidem  Assesment: She has fever. That seems to be improving. She has severe lymphedema and has been treated for cellulitis but should be finished with her antibiotics. She has hyperkalemia which is new today. Her renal function is not normal Principal Problem:  *Fever of unknown origin Active Problems:  Lymphedema of lower extremity  Cellulitis of left leg  Diabetes mellitus  Morbid obesity    Plan: She'll need some Kayexalate she can keep it down if not coming Kayexalate enema. All major she's off any potassium-containing medications. I will discontinue Rocephin and vancomycin today and see if that helps any with her situation. I wonder about putting her on something like Reglan to help with her nausea but will discuss that with the GI team    LOS: 6 days   Dyllan Kats L 11/10/2011, 8:36 AM

## 2011-11-10 NOTE — Progress Notes (Signed)
ANTIBIOTIC CONSULT NOTE - follow up  Pharmacy Consult for Cipro  D/W Dr [name]  Indication: fevers, possible pyelonephritis, cellulitis  Allergies  Allergen Reactions  . Contrast Media (Iodinated Diagnostic Agents) Swelling    Dye for cardiac cath.  . Pneumococcal Vaccines    Patient Measurements: Height: _0  (165.1 cm) Weight: 378 lb 15.5 oz (171.9 kg) IBW/kg (Calculated) : 57  Adjusted Body Weight : 88.2 KG  Vital Signs: Temp: 98.8 F (37.1 C) (11/21 0522) Temp src: Oral (11/21 0522) BP: 146/93 mmHg (11/21 0522) Pulse Rate: 84  (11/21 0522) Intake/Output from previous day: 11/20 0701 - 11/21 0700 In: 2075.8 [P.O.:480; I.V.:895.8; IV Piggyback:700] Out: 300 [Urine:300] Intake/Output from this shift:    Labs:  Basename 11/10/11 0602 11/09/11 0512 11/08/11 0551  WBC 3.4* -- --  HGB 9.2* -- --  PLT 315 -- --  LABCREA -- -- --  CREATININE 2.40* 2.02* 1.70*   Estimated Creatinine Clearance: 48.1 ml/min (by C-G formula based on Cr of 2.4).   Basename 11/08/11 0823  VANCOTROUGH 32.6*  VANCOPEAK --  [name] --  GENTTROUGH --  GENTPEAK --  GENTRANDOM --  TOBRATROUGH --  Shelah Lewandowsky --  TOBRARND --  AMIKACINPEAK --  AMIKACINTROU --  AMIKACIN --    Microbiology: Recent Results (from the past 720 hour(s))  CULTURE, BLOOD (ROUTINE X 2)     Status: Normal   Collection Time   10/26/11  6:31 PM      Component Value Range Status Comment   Specimen Description RIGHT ANTECUBITAL   Final    Special Requests BOTTLES DRAWN AEROBIC AND ANAEROBIC [place]   Final    Culture NO GROWTH 5 DAYS   Final    Report Status 10/31/2011 FINAL   Final   CULTURE, BLOOD (ROUTINE X 2)     Status: Normal   Collection Time   10/26/11  6:41 PM      Component Value Range Status Comment   Specimen Description BLOOD RIGHT WRIST DRAWN BY RN   Final    Special Requests BOTTLES DRAWN AEROBIC ONLY 5CC   Final    Culture NO GROWTH 5 DAYS   Final    Report Status 10/31/2011 FINAL   Final     URINE CULTURE     Status: Normal   Collection Time   11/05/11  4:13 PM      Component Value Range Status Comment   Specimen Description URINE, CLEAN CATCH   Final    Special Requests NONE   Final    Setup Time [id number]   Final    Colony Count 8,000 COLONIES/ML   Final    Culture INSIGNIFICANT GROWTH   Final    Report Status 11/07/2011 FINAL   Final   CULTURE, BLOOD (ROUTINE X 2)     Status: Normal (Preliminary result)   Collection Time   11/06/11 10:40 AM      Component Value Range Status Comment   Specimen Description BLOOD PICC LINE DRAWN BY RN   Final    Special Requests BOTTLES DRAWN AEROBIC AND ANAEROBIC 6CC   Final    Culture NO GROWTH 3 DAYS   Final    Report Status PENDING   Incomplete   CULTURE, BLOOD (ROUTINE X 2)     Status: Normal (Preliminary result)   Collection Time   11/06/11 11:19 AM      Component Value Range Status Comment   Specimen Description BLOOD PICC LINE DRAWN BY RN   Final  Special Requests BOTTLES DRAWN AEROBIC AND ANAEROBIC 6CC   Final    Culture NO GROWTH 3 DAYS   Final    Report Status PENDING   Incomplete   CLOSTRIDIUM DIFFICILE BY PCR     Status: Normal   Collection Time   11/07/11  5:43 AM      Component Value Range Status Comment   C difficile by pcr NEGATIVE  NEGATIVE  Final   CULTURE, BLOOD (ROUTINE X 2)     Status: Normal (Preliminary result)   Collection Time   11/07/11  6:37 AM      Component Value Range Status Comment   Specimen Description BLOOD DRAWN BY RN PICC LINE   Final    Special Requests     Final    Value: BOTTLES DRAWN AEROBIC AND ANAEROBIC 6CC EACH BOTTLE   Culture NO GROWTH 2 DAYS   Final    Report Status PENDING   Incomplete    Medical History: Past Medical History  Diagnosis Date  . Diabetes mellitus   . Chronic pain   . Lymph edema   . Cellulitis   . Chiari malformation     s/p surgery  . S/P colonoscopy 05/26/2007    Dr. Laural Golden sigmoid diverticulosis random biopsies benign  . CAD (coronary artery  disease)   . Hypercholesterolemia   . Hypertension   . Asthma   . S/P endoscopy 05/01/2009    Dr. Penelope Coop pill-induced esophageal ulcerations distal to midesophagus, 2 small ulcers in the antrum of the stomach   Medications:  Prescriptions prior to admission  Medication Sig Dispense Refill  . Acetaminophen (RA FEVER REDUCER/PAIN RELIEVER PO) Take 1 tablet by mouth as needed. For fever       . dextrose 5 % SOLN 50 mL with cefTRIAXone 1 G SOLR 1 g Inject 1 g into the vein daily.  1000 mg  10  . heparin lock flush 100 UNIT/ML SOLN 2.5 mLs (250 Units total) by Intracatheter route once.  250 vial  0  . insulin lispro (HUMALOG) 100 UNIT/ML injection Inject 60 Units into the skin 2 (two) times daily.        Marland Kitchen oxyCODONE-acetaminophen (PERCOCET) 5-325 MG per tablet Take 1 tablet by mouth 4 (four) times daily as needed. For pain       . potassium chloride SA (K-DUR,KLOR-CON) 20 MEQ tablet Take 20 mEq by mouth 3 (three) times daily.        . sodium chloride 0.9 % SOLN 500 mL with vancomycin 1000 MG SOLR 1,500 mg Inject 1,500 mg into the vein daily.  1500 mg  10  . torsemide (DEMADEX) 100 MG tablet Take 50 mg by mouth 2 (two) times daily.         Assessment: SCr still rising and on two potentially nephrotoxic ABX  Afebrile this AM Morbidly obese Worsening renal fxn Cultures negative so far  Goal of Therapy:  Eradicate infection.   Plan: Narrow ABX to Cipro monotherapy (discussed w/ Dr. [name]) F/U cultures  Labs per protocol, monitor SCr daily for now  Pricilla Larsson 11/10/2011,9:47 AM

## 2011-11-10 NOTE — Progress Notes (Addendum)
Subjective: [name] is a 45 y.o. female admitted with fever, vomiting, diarrhea. Recent hospitalization for cellulitis, went home on IV antibiotics. Continues to complain of vomiting. Several episodes during the night. Comes on quickly and sometimes without warning. Now having heartburn which usually not had in the past. Prior to recent hospitalization denies any issues of vomiting or diarrhea. Having couple loose stools daily since on multiple antibiotics. C. difficile PCR negative x1. No hematemesis or rectal bleeding. Worsening creatinine, on IV fluids at 125cc/hr. Tm 103 at 3pm yesterday. T 102.6 at 2am this morning. On Rocephin, Cipro, Vancomycin, Tobramycin. Blood cultures, urine culture negative. Lymphadenopathy on CT. Lymphedema (chronic). Complains of headache for a couple of days. Denies abdominal pain.  Objective: Vital signs in last 24 hours: Temp:  [98.8 F (37.1 C)-103 F (39.4 C)] 98.8 F (37.1 C) (11/21 0522) Pulse Rate:  [80-86] 84  (11/21 0522) Resp:  [18-22] 18  (11/21 0522) BP: (145-170)/(77-94) 146/93 mmHg (11/21 0522) SpO2:  [97 %-100 %] 97 % (11/21 0522) Weight:  [378 lb 15.5 oz (171.9 kg)] 378 lb 15.5 oz (171.9 kg) (11/21 0522) Last BM Date: 11/09/11  General:   Alert,  morbidly obese, very pleasant and cooperative in NAD Head:  Normocephalic and atraumatic. Eyes:  Sclera clear, no icterus.  Chest: CTA bilaterally without rales, rhonchi, crackles.    Heart:  Regular rate and rhythm; no murmurs, clicks, rubs,  or gallops. Abdomen:  Soft, nontender. Exam limited due to body habitus. Normal bowel sounds, without guarding, and without rebound.   Extremities:  Massive lymphedema, no erythema.  Neurologic:  Alert and  oriented x4;  grossly normal neurologically. Skin:  Intact without significant lesions or rashes. Psych:  Alert and cooperative. Normal mood and affect.  Intake/Output from previous day: 11/20 0701 - 11/21 0700 In: 2075.8 [P.O.:480; I.V.:895.8; IV  Piggyback:700] Out: 300 [Urine:300] Intake/Output this shift:    Lab Results: CBC  Basename 11/10/11 0602  WBC 3.4*  HGB 9.2*  HCT 28.6*  MCV 87.5  PLT 315   BMET  Basename 11/10/11 0602 11/09/11 0512 11/08/11 0551  NA 134* 133* 133*  K 5.4* 4.8 4.4  CL 103 101 100  CO2 _0 GLUCOSE 118* 160* 106*  BUN _1 CREATININE 2.40* 2.02* 1.70*  CALCIUM 8.3* 8.2* 8.2*   Lab Results  Component Value Date   ALT 26 11/04/2011   AST 31 11/04/2011   ALKPHOS 205* 11/04/2011   BILITOT 0.3 11/04/2011      Assessment: Intractable vomiting which began 2 days after recent discharge from the hospital. Denies abdominal pain. Elevated alkaline phosphatase on November 15 which was new. White cell count has not been elevated this admission. She also has anemia, hemoglobin 13.3 on November 6, 9.5 on November 15. No complaints of overt GI bleeding, nosebleeds, she is status post hysterectomy.  At this point we need to rule out biliary etiology for her vomiting. We need to evaluate for occult GI bleeding given her drop in her hemoglobin. I am also concerned about her rising creatinine, was normal on November 6 but now gradually inclined to 2.4.  If biliary etiology is ruled out, we need to consider head CT given her prior history of Chiari malformation.  Persistent fever, unknown etiology. Rising creatinine. May need to consider vasculitis, spinal tap.  Plan: 1. LFTs. 2. Hemoccult stool. 3. Abdominal ultrasound. 4. Consider discontinuing potassium. Kayexalate dose noted. 5. Add low dose IV Reglan.   LOS: 6  days   Neil Crouch  11/10/2011, 7:56 AM   lfts noteably elevated (query drug affect vs other process).  Abdominal U/s grossly negative for biliary pathology.  Enlarged lymph nodes are of uncertain significance but due to number, size and fever, further evaluation recommended.  Will recheck Lfts in am . Consider head CT to r/o CNS process which may be contributing nausea.

## 2011-11-11 ENCOUNTER — Inpatient Hospital Stay (HOSPITAL_COMMUNITY): Payer: PRIVATE HEALTH INSURANCE

## 2011-11-11 ENCOUNTER — Encounter (HOSPITAL_COMMUNITY): Payer: Self-pay | Admitting: Internal Medicine

## 2011-11-11 ENCOUNTER — Other Ambulatory Visit: Payer: Self-pay

## 2011-11-11 DIAGNOSIS — N179 Acute kidney failure, unspecified: Secondary | ICD-10-CM | POA: Diagnosis present

## 2011-11-11 DIAGNOSIS — E785 Hyperlipidemia, unspecified: Secondary | ICD-10-CM | POA: Diagnosis present

## 2011-11-11 DIAGNOSIS — I10 Essential (primary) hypertension: Secondary | ICD-10-CM | POA: Diagnosis present

## 2011-11-11 DIAGNOSIS — R4182 Altered mental status, unspecified: Secondary | ICD-10-CM | POA: Diagnosis present

## 2011-11-11 DIAGNOSIS — D649 Anemia, unspecified: Secondary | ICD-10-CM | POA: Diagnosis present

## 2011-11-11 DIAGNOSIS — E875 Hyperkalemia: Secondary | ICD-10-CM | POA: Diagnosis present

## 2011-11-11 DIAGNOSIS — D72819 Decreased white blood cell count, unspecified: Secondary | ICD-10-CM | POA: Diagnosis present

## 2011-11-11 LAB — BASIC METABOLIC PANEL
BUN: 24 mg/dL — ABNORMAL HIGH (ref 6–23)
CO2: 22 mEq/L (ref 19–32)
Chloride: 109 mEq/L (ref 96–112)
GFR calc Af Amer: 26 mL/min — ABNORMAL LOW (ref >90)
GFR calc Af Amer: 25 mL/min — ABNORMAL LOW (ref >90)
GFR calc non Af Amer: 21 mL/min — ABNORMAL LOW (ref >90)
Glucose, Bld: 94 mg/dL (ref 70–99)
Potassium: 4.8 mEq/L (ref 3.5–5.1)
Potassium: 5.2 mEq/L — ABNORMAL HIGH (ref 3.5–5.1)
Sodium: 134 mEq/L — ABNORMAL LOW (ref 135–145)

## 2011-11-11 LAB — TSH: TSH: 2.983 u[IU]/mL (ref 0.350–4.500)

## 2011-11-11 LAB — DIFFERENTIAL
Basophils Relative: 1 % (ref 0–1)
Eosinophils Absolute: 0.1 10*3/uL (ref 0.0–0.7)
Eosinophils Relative: 2 % (ref 0–5)
Lymphs Abs: 0.9 10*3/uL (ref 0.7–4.0)
Monocytes Absolute: 0.2 10*3/uL (ref 0.1–1.0)
Monocytes Absolute: 0.3 10*3/uL (ref 0.1–1.0)
Monocytes Relative: 6 % (ref 3–12)
Monocytes Relative: 7 % (ref 3–12)
Neutro Abs: 3 10*3/uL (ref 1.7–7.7)
Neutrophils Relative %: 72 % (ref 43–77)

## 2011-11-11 LAB — PROTIME-INR: Prothrombin Time: 14.2 seconds (ref 11.6–15.2)

## 2011-11-11 LAB — CBC
HCT: 26.9 % — ABNORMAL LOW (ref 36.0–46.0)
HCT: 28.8 % — ABNORMAL LOW (ref 36.0–46.0)
Hemoglobin: 8.8 g/dL — ABNORMAL LOW (ref 12.0–15.0)
MCH: 28.1 pg (ref 26.0–34.0)
MCHC: 32.7 g/dL (ref 30.0–36.0)
MCHC: 33 g/dL (ref 30.0–36.0)
MCV: 85.2 fL (ref 78.0–100.0)
RDW: 16.7 % — ABNORMAL HIGH (ref 11.5–15.5)

## 2011-11-11 LAB — GLUCOSE, CAPILLARY
Glucose-Capillary: 94 mg/dL (ref 70–99)
Glucose-Capillary: 93 mg/dL (ref 70–99)

## 2011-11-11 LAB — CARDIAC PANEL(CRET KIN+CKTOT+MB+TROPI)
CK, MB: 1.3 ng/mL (ref 0.3–4.0)
Total CK: 34 U/L (ref 7–177)
Troponin I: <0.3 ng/mL (ref <0.30)

## 2011-11-11 LAB — HEPATIC FUNCTION PANEL
AST: 49 U/L — ABNORMAL HIGH (ref 0–37)
Bilirubin, Direct: 0.1 mg/dL (ref 0.0–0.3)
Total Bilirubin: 0.1 mg/dL — ABNORMAL LOW (ref 0.3–1.2)

## 2011-11-11 LAB — CULTURE, BLOOD (ROUTINE X 2)
Culture: NO GROWTH
Culture: NO GROWTH

## 2011-11-11 LAB — HEMOGLOBIN A1C
Hgb A1c MFr Bld: 10.2 % — ABNORMAL HIGH (ref <5.7)
Mean Plasma Glucose: 246 mg/dL — ABNORMAL HIGH (ref <117)

## 2011-11-11 LAB — PROCALCITONIN: Procalcitonin: 0.27 ng/mL

## 2011-11-11 LAB — OCCULT BLOOD X 1 CARD TO LAB, STOOL: Fecal Occult Bld: POSITIVE

## 2011-11-11 LAB — VANCOMYCIN, TROUGH: Vancomycin Tr: 11.1 ug/mL (ref 10.0–20.0)

## 2011-11-11 LAB — C-REACTIVE PROTEIN: CRP: 0.81 mg/dL — ABNORMAL HIGH (ref <0.60)

## 2011-11-11 LAB — SEDIMENTATION RATE: Sed Rate: 127 mm/hr — ABNORMAL HIGH (ref 0–22)

## 2011-11-11 MED ORDER — DEXTROSE 5 % IV SOLN: 10.0000 mg/kg | Freq: Three times a day (TID) | INTRAVENOUS | Status: DC

## 2011-11-11 MED ORDER — LINEZOLID 2 MG/ML IV SOLN
600.0000 mg | Freq: Two times a day (BID) | INTRAVENOUS | Status: DC
  Filled 2011-11-11 (×3): qty 300

## 2011-11-11 MED ORDER — SODIUM CHLORIDE 0.9 % IJ SOLN
INTRAMUSCULAR | Status: AC
  Administered 2011-11-11: 10 mL
  Filled 2011-11-11: qty 3

## 2011-11-11 MED ORDER — ENOXAPARIN SODIUM 30 MG/0.3ML ~~LOC~~ SOLN: 30.0000 mg | SUBCUTANEOUS | Status: DC

## 2011-11-11 MED ORDER — SENNOSIDES-DOCUSATE SODIUM 8.6-50 MG PO TABS
1.0000 | ORAL_TABLET | Freq: Every day | ORAL | Status: DC | PRN
  Filled 2011-11-11: qty 1

## 2011-11-11 MED ORDER — HYDRALAZINE HCL 20 MG/ML IJ SOLN
10.0000 mg | Freq: Three times a day (TID) | INTRAMUSCULAR | Status: DC | PRN
  Administered 2011-11-12: 10 mg via INTRAVENOUS
  Filled 2011-11-11 (×2): qty 0.5

## 2011-11-11 MED ORDER — SODIUM CHLORIDE 0.9 % IV SOLN
500.0000 mg | Freq: Three times a day (TID) | INTRAVENOUS | Status: DC
  Administered 2011-11-11: 500 mg via INTRAVENOUS
  Filled 2011-11-11 (×4): qty 500

## 2011-11-11 MED ORDER — SODIUM CHLORIDE 0.9 % IV SOLN
INTRAVENOUS | Status: DC
  Administered 2011-11-11 – 2011-11-14 (×4): via INTRAVENOUS
  Administered 2011-11-17: 20 mL/h via INTRAVENOUS
  Administered 2011-11-18: 120 mL/h via INTRAVENOUS

## 2011-11-11 MED ORDER — LINEZOLID 2 MG/ML IV SOLN
600.0000 mg | Freq: Two times a day (BID) | INTRAVENOUS | Status: DC
  Administered 2011-11-11: 600 mg via INTRAVENOUS
  Filled 2011-11-11 (×3): qty 300

## 2011-11-11 MED ORDER — DEXTROSE 5 % IV SOLN
1.0000 g | Freq: Three times a day (TID) | INTRAVENOUS | Status: DC
  Administered 2011-11-11 – 2011-11-12 (×2): 1 g via INTRAVENOUS
  Filled 2011-11-11 (×5): qty 1

## 2011-11-11 MED ORDER — DEXTROSE 5 % IV SOLN
10.0000 mg/kg | Freq: Three times a day (TID) | INTRAVENOUS | Status: DC
  Administered 2011-11-11 – 2011-11-13 (×5): 570 mg via INTRAVENOUS
  Filled 2011-11-11 (×7): qty 11.4

## 2011-11-11 MED ORDER — CIPROFLOXACIN IN D5W 400 MG/200ML IV SOLN
400.0000 mg | Freq: Two times a day (BID) | INTRAVENOUS | Status: DC
  Administered 2011-11-12 (×2): 400 mg via INTRAVENOUS
  Filled 2011-11-11 (×3): qty 200

## 2011-11-11 NOTE — Progress Notes (Signed)
Called report to Sunday Spillers, Therapist, sports at [facility].  Verbalized understanding.  Pt transferred to [place] via [place].  Schonewitz, Eulis Canner 11/11/2011

## 2011-11-11 NOTE — Progress Notes (Signed)
ANTIBIOTIC CONSULT NOTE -   Pharmacy Consult for Cipro and Primaxin  D/W Dr [name]  Indication: fevers, possible pyelonephritis, cellulitis  Allergies  Allergen Reactions  . Contrast Media (Iodinated Diagnostic Agents) Swelling    Dye for cardiac cath.  . Pneumococcal Vaccines    Patient Measurements: Height: 5' 5" (165.1 cm) Weight: 378 lb 15.5 oz (171.9 kg) IBW/kg (Calculated) : 57  Adjusted Body Weight : 88.2 KG  Vital Signs: Temp: 101.3 F (38.5 C) (11/22 0812) Temp src: Oral (11/22 0812) BP: 144/91 mmHg (11/22 0603) Pulse Rate: 85  (11/22 0603) Intake/Output from previous day: 11/21 0701 - 11/22 0700 In: 360 [P.O.:360] Out: 550 [Urine:550] Intake/Output from this shift:    Labs:  Basename 11/11/11 0620 11/10/11 0602 11/09/11 0512  WBC 4.1 3.4* --  HGB 8.8* 9.2* --  PLT 280 315 --  LABCREA -- -- --  CREATININE 2.45* 2.40* 2.02*   Estimated Creatinine Clearance: 47.1 ml/min (by C-G formula based on Cr of 2.45).  No results found for this basename: VANCOTROUGH:2,VANCOPEAK:2,VANCORANDOM:2,GENTTROUGH:2,GENTPEAK:2,GENTRANDOM:2,TOBRATROUGH:2,TOBRAPEAK:2,TOBRARND:2,AMIKACINPEAK:2,AMIKACINTROU:2,AMIKACIN:2, in the last 72 hours  Microbiology: Recent Results (from the past 720 hour(s))  CULTURE, BLOOD (ROUTINE X 2)     Status: Normal   Collection Time   10/26/11  6:31 PM      Component Value Range Status Comment   Specimen Description RIGHT ANTECUBITAL   Final    Special Requests BOTTLES DRAWN AEROBIC AND ANAEROBIC [place]   Final    Culture NO GROWTH 5 DAYS   Final    Report Status 10/31/2011 FINAL   Final   CULTURE, BLOOD (ROUTINE X 2)     Status: Normal   Collection Time   10/26/11  6:41 PM      Component Value Range Status Comment   Specimen Description BLOOD RIGHT WRIST DRAWN BY RN   Final    Special Requests BOTTLES DRAWN AEROBIC ONLY 5CC   Final    Culture NO GROWTH 5 DAYS   Final    Report Status 10/31/2011 FINAL   Final   URINE CULTURE     Status: Normal     Collection Time   11/05/11  4:13 PM      Component Value Range Status Comment   Specimen Description URINE, CLEAN CATCH   Final    Special Requests NONE   Final    Setup Time [id number]   Final    Colony Count 8,000 COLONIES/ML   Final    Culture INSIGNIFICANT GROWTH   Final    Report Status 11/07/2011 FINAL   Final   CULTURE, BLOOD (ROUTINE X 2)     Status: Normal   Collection Time   11/06/11 10:40 AM      Component Value Range Status Comment   Specimen Description BLOOD PICC LINE DRAWN BY RN   Final    Special Requests BOTTLES DRAWN AEROBIC AND ANAEROBIC [place]   Final    Culture NO GROWTH 5 DAYS   Final    Report Status 11/11/2011 FINAL   Final   CULTURE, BLOOD (ROUTINE X 2)     Status: Normal   Collection Time   11/06/11 11:19 AM      Component Value Range Status Comment   Specimen Description BLOOD PICC LINE DRAWN BY RN   Final    Special Requests BOTTLES DRAWN AEROBIC AND ANAEROBIC [place]   Final    Culture NO GROWTH 5 DAYS   Final    Report Status 11/11/2011 FINAL   Final  CLOSTRIDIUM DIFFICILE BY PCR     Status: Normal   Collection Time   11/07/11  5:43 AM      Component Value Range Status Comment   C difficile by pcr NEGATIVE  NEGATIVE  Final   CULTURE, BLOOD (ROUTINE X 2)     Status: Normal (Preliminary result)   Collection Time   11/07/11  6:37 AM      Component Value Range Status Comment   Specimen Description BLOOD DRAWN BY RN PICC LINE   Final    Special Requests     Final    Value: BOTTLES DRAWN AEROBIC AND ANAEROBIC 6CC EACH BOTTLE   Culture NO GROWTH 4 DAYS   Final    Report Status PENDING   Incomplete    Medical History: Past Medical History  Diagnosis Date  . Diabetes mellitus   . Chronic pain   . Lymph edema   . Cellulitis   . Chiari malformation     s/p surgery  . S/P colonoscopy 05/26/2007    Dr. Laural Golden sigmoid diverticulosis random biopsies benign  . CAD (coronary artery disease)   . Hypercholesterolemia   . Hypertension   . Asthma   . S/P  endoscopy 05/01/2009    Dr. Penelope Coop pill-induced esophageal ulcerations distal to midesophagus, 2 small ulcers in the antrum of the stomach   Medications:  Prescriptions prior to admission  Medication Sig Dispense Refill  . Acetaminophen (RA FEVER REDUCER/PAIN RELIEVER PO) Take 1 tablet by mouth as needed. For fever       . dextrose 5 % SOLN 50 mL with cefTRIAXone 1 G SOLR 1 g Inject 1 g into the vein daily.  1000 mg  10  . heparin lock flush 100 UNIT/ML SOLN 2.5 mLs (250 Units total) by Intracatheter route once.  250 vial  0  . insulin lispro (HUMALOG) 100 UNIT/ML injection Inject 60 Units into the skin 2 (two) times daily.        Marland Kitchen oxyCODONE-acetaminophen (PERCOCET) 5-325 MG per tablet Take 1 tablet by mouth 4 (four) times daily as needed. For pain       . potassium chloride SA (K-DUR,KLOR-CON) 20 MEQ tablet Take 20 mEq by mouth 3 (three) times daily.        . sodium chloride 0.9 % SOLN 500 mL with vancomycin 1000 MG SOLR 1,500 mg Inject 1,500 mg into the vein daily.  1500 mg  10  . torsemide (DEMADEX) 100 MG tablet Take 50 mg by mouth 2 (two) times daily.         Assessment: SCr still rising   Afebrile yesterday, temperature 101 to 102 today Morbidly obese Worsening renal fxn  Goal of Therapy:  Eradicate infection.   Plan: Continue Cipro and add Primaxin Zyvox also started due to renal function F/U cultures  Labs per protocol, monitor SCr daily for now  [place], [employer] 11/11/2011,9:20 AM

## 2011-11-11 NOTE — Consult Note (Signed)
[facility] Consult Note  Date: 11/11/2011  Patient name: [name] Medical record number: [id number] Date of birth: [birth year] Age: 45 y.o. Gender: female   Chief Complaint: AMS, [place] Reason For Consult: AMS  History of Present Illness: [name] is an 45 y.o.morbidly obese, aa female transferred from [facility] for further evaluation and management of fever of unknown origin and altered mental status.  Had been treated with Rocephin and vancomycin and tobramycin for about 10 days for fever and LLE cellulitis. Her cellulitis looks much better, but she has continued having fever and this morning she is very sluggishly responsive and her renal function is continuing to get slightly worse.    Patient/pt husband denies history of seizure or stroke.        Meds Inpatient  Scheduled:   . ciprofloxacin  400 mg Intravenous Q12H  . enoxaparin  80 mg Subcutaneous Q24H  . feeding supplement  237 mL Oral TID WC  . feeding supplement  1 Container Oral BID BM  . Flora-Q  1 capsule Oral Daily  . fluconazole  100 mg Oral Daily  . imipenem-cilastatin  500 mg Intravenous Q8H  . insulin aspart  0-20 Units Subcutaneous TID WC  . insulin aspart  0-5 Units Subcutaneous QHS  . insulin aspart  5 Units Subcutaneous TID WC  . insulin glargine  20 Units Subcutaneous Daily  . linezolid  600 mg Intravenous Q12H  . metoCLOPramide (REGLAN) injection  5 mg Intravenous TID WC & HS  . metoprolol  5 mg Intravenous Q6H  . ondansetron  4 mg Intravenous QID  . pantoprazole (PROTONIX) IV  40 mg Intravenous Q12H  . DISCONTD: enoxaparin  30 mg Subcutaneous Q24H  . DISCONTD: linezolid  600 mg Intravenous Q12H  . DISCONTD: linezolid  600 mg Intravenous Q12H  . DISCONTD: sodium chloride  10 mL Intracatheter Q12H    Prior to Admission medications   Medication Sig Start Date End Date Taking? Authorizing Provider  Acetaminophen (RA FEVER REDUCER/PAIN RELIEVER PO) Take 1 tablet by mouth as  needed. For fever    Yes Historical Provider, MD  dextrose 5 % SOLN 50 mL with cefTRIAXone 1 G SOLR 1 g Inject 1 g into the vein daily. 11/01/11  Yes Alonza Bogus  heparin lock flush 100 UNIT/ML SOLN 2.5 mLs (250 Units total) by Intracatheter route once. 11/01/11  Yes Alonza Bogus  insulin lispro (HUMALOG) 100 UNIT/ML injection Inject 60 Units into the skin 2 (two) times daily.     Yes Historical Provider, MD  oxyCODONE-acetaminophen (PERCOCET) 5-325 MG per tablet Take 1 tablet by mouth 4 (four) times daily as needed. For pain    Yes Historical Provider, MD  potassium chloride SA (K-DUR,KLOR-CON) 20 MEQ tablet Take 20 mEq by mouth 3 (three) times daily.     Yes Historical Provider, MD  sodium chloride 0.9 % SOLN 500 mL with vancomycin 1000 MG SOLR 1,500 mg Inject 1,500 mg into the vein daily. 11/01/11  Yes Alonza Bogus  torsemide (DEMADEX) 100 MG tablet Take 50 mg by mouth 2 (two) times daily.     Yes Historical Provider, MD     Allergies: Contrast media and Pneumococcal vaccines  Past Medical History  Diagnosis Date  . Diabetes mellitus   . Chronic pain   . Lymph edema   . Cellulitis   . Chiari malformation     s/p surgery  . S/P colonoscopy 05/26/2007    Dr. Laural Golden sigmoid diverticulosis  random biopsies benign  . CAD (coronary artery disease)   . Hypercholesterolemia   . Hypertension   . Asthma   . S/P endoscopy 05/01/2009    Dr. Penelope Coop pill-induced esophageal ulcerations distal to midesophagus, 2 small ulcers in the antrum of the stomach   Past Surgical History  Procedure Date  . Cesarean section      x 2  . Abdominal hysterectomy   . Tonsillectomy   . Adenoidectomy   . Thyroidectomy, partial   . Craniectomy suboccipital w/ cervical laminectomy / chiari    Family History  Problem Relation Age of Onset  . Colon cancer Mother 57  . Stroke Mother 39  . Coronary artery disease Mother    Social History: Married.  Non ambulatory due to chronic LE lymphedema.   Reports that she has never smoked. She does not have any smokeless tobacco history on file. She reports that she does not drink alcohol or use illicit drugs.  Review of Systems: Limited due to patient confusion.  Examination:  Blood pressure 165/90, pulse 81, temperature 100.2 F (37.9 C), temperature source Oral, resp. rate 23, height _0  (1.651 m), weight 171.9 kg (378 lb 15.5 oz), SpO2 100.00%. In general, pt appears slightly SOB, lying reclined in bed, dry lips. Attended by husband.  FROM head/neck without nucal rigidity.  Mental status:   The patient is oriented to person, and place. Knows month and thanksgiving holiday.  Limited attention span and concentration. Language including repetition, naming, following commands are modestly intact.  Tends to perseverate.  No aphasia or dysarthria.  Cranial Nerves: Pupils are equally round and reactive to light. Visual fields full to confrontation. Extraocular movements are intact without nystagmus. Facial sensation intact. Muscles of facial expression are symmetric.Tongue protrusion midline.  Shoulder shrug intact  Motor:  Spontaneously moves bilat UE's and RLE, toes bilaterally.  Unable to move LLE due to lymphedema.     Reflexes:   Biceps  Triceps Brachioradialis Knee Ankle  Right 2+  2+  2+   2+ 0  Left  2+  2+  2+   2+ 0  Plantars: equivocal.  Coordination: can not accurately perform due to diminished attention and concentration.  Sensation: intact to light tough throughout.   Labs: CBC    Component Value Date/Time   WBC 4.1 11/11/2011 0620   RBC 3.09* 11/11/2011 0620   HGB 8.8* 11/11/2011 0620   HCT 26.9* 11/11/2011 0620   PLT 280 11/11/2011 0620   MCV 87.1 11/11/2011 0620   MCH 28.5 11/11/2011 0620   MCHC 32.7 11/11/2011 0620   RDW 16.7* 11/11/2011 0620   LYMPHSABS 0.7 11/11/2011 0620   MONOABS 0.2 11/11/2011 0620   EOSABS 0.1 11/11/2011 0620   BASOSABS 0.0 11/11/2011 0620   Chemistry    Component Value  Date/Time   NA 136 11/11/2011 0620   K 4.8 11/11/2011 0620   CL 109 11/11/2011 0620   CO2 22 11/11/2011 0620   GLUCOSE 94 11/11/2011 0620   BUN 24* 11/11/2011 0620   CREATININE 2.45* 11/11/2011 0620   CALCIUM 7.6* 11/11/2011 0620   MG 1.8 04/29/2009 0630   AST 49* 11/11/2011 0620   ALT 33 11/11/2011 0620   ALKPHOS 304* 11/11/2011 0620   BILITOT 0.1* 11/11/2011 0620   PROT 5.7* 11/11/2011 0620   ALBUMIN 1.8* 11/11/2011 0620   Lipid Panel    Component Value Date/Time   CHOL  Value: 167        ATP III CLASSIFICATION:  <200  mg/dL   Desirable  200-239  mg/dL   Borderline High  >=240    mg/dL   High        04/29/2009 0630   TRIG 220* 04/29/2009 0630   HDL 37* 04/29/2009 0630   CHOLHDL 4.5 04/29/2009 0630   VLDL 44* 04/29/2009 0630   LDLCALC  Value: 86        Total Cholesterol/HDL:CHD Risk Coronary Heart Disease Risk Table                     Men   Women  1/2 Average Risk   3.4   3.3  Average Risk       5.0   4.4  2 X Average Risk   9.6   7.1  3 X Average Risk  23.4   11.0        Use the calculated Patient Ratio above and the CHD Risk Table to determine the patient's CHD Risk.        ATP III CLASSIFICATION (LDL):  <100     mg/dL   Optimal  100-129  mg/dL   Near or Above                    Optimal  130-159  mg/dL   Borderline  160-189  mg/dL   High  >190     mg/dL   Very High 04/29/2009 0630   A1C    Component Value Date/Time   HGBA1C  Value: 11.6 (NOTE)                                                                       According to the ADA Clinical Practice Recommendations for 2011, when HbA1c is used as a screening test:   >=6.5%   Diagnostic of Diabetes Mellitus           (if abnormal result  is confirmed)  5.7-6.4%   Increased risk of developing Diabetes Mellitus  References:Diagnosis and Classification of Diabetes Mellitus,Diabetes FFMB,8466,59(DJTTS 1):S62-S69 and Standards of Medical Care in         Diabetes - 2011,Diabetes Care,2011,34  (Suppl 1):S11-S61.* 05/28/2010 0459   HGBA1C   Value: 9.5 (NOTE) The ADA recommends the following therapeutic goal for glycemic control related to Hgb A1c measurement: Goal of therapy: <6.5 Hgb A1c  Reference: American Diabetes Association: Clinical Practice Recommendations 2010, Diabetes Care, 2010, 33: (Suppl  1).* 04/29/2009 0630    Recent Results (from the past 240 hour(s))  URINE CULTURE     Status: Normal   Collection Time   11/05/11  4:13 PM      Component Value Range Status Comment   Specimen Description URINE, CLEAN CATCH   Final    Special Requests NONE   Final    Setup Time [id number]   Final    Colony Count 8,000 COLONIES/ML   Final    Culture INSIGNIFICANT GROWTH   Final    Report Status 11/07/2011 FINAL   Final   CULTURE, BLOOD (ROUTINE X 2)     Status: Normal   Collection Time   11/06/11 10:40 AM      Component Value Range Status Comment   Specimen Description BLOOD PICC LINE DRAWN BY RN  Final    Special Requests BOTTLES DRAWN AEROBIC AND ANAEROBIC [place]   Final    Culture NO GROWTH 5 DAYS   Final    Report Status 11/11/2011 FINAL   Final   CULTURE, BLOOD (ROUTINE X 2)     Status: Normal   Collection Time   11/06/11 11:19 AM      Component Value Range Status Comment   Specimen Description BLOOD PICC LINE DRAWN BY RN   Final    Special Requests BOTTLES DRAWN AEROBIC AND ANAEROBIC [place]   Final    Culture NO GROWTH 5 DAYS   Final    Report Status 11/11/2011 FINAL   Final   CLOSTRIDIUM DIFFICILE BY PCR     Status: Normal   Collection Time   11/07/11  5:43 AM      Component Value Range Status Comment   C difficile by pcr NEGATIVE  NEGATIVE  Final   CULTURE, BLOOD (ROUTINE X 2)     Status: Normal (Preliminary result)   Collection Time   11/07/11  6:37 AM      Component Value Range Status Comment   Specimen Description BLOOD DRAWN BY RN PICC LINE   Final    Special Requests     Final    Value: BOTTLES DRAWN AEROBIC AND ANAEROBIC 6CC EACH BOTTLE   Culture NO GROWTH 4 DAYS   Final    Report Status PENDING    Incomplete   MRSA PCR SCREENING     Status: Normal   Collection Time   11/11/11  2:39 PM      Component Value Range Status Comment   MRSA by PCR NEGATIVE  NEGATIVE  Final     Imaging: US Abdomen Complete  11/10/2011  *RADIOLOGY REPORT*  Clinical Data:  Elevated liver function tests.  Nausea vomiting. Diabetes.  COMPLETE ABDOMINAL ULTRASOUND  Comparison:  CT scan dated 11/06/2011  Findings:  Gallbladder:  No gallstones, gallbladder wall thickening, or pericholecystic fluid.Negative sonographic Murphy's sign.  Common bile duct:  Normal.  4.9 mm in diameter.  Liver:  Hepatomegaly with diffuse increased echogenicity consistent with hepatic steatosis.  No focal lesions.  No biliary ductal dilatation.  IVC:  Normal.  Pancreas:  Normal.  Spleen:  Normal.  9.9 cm in length.  Right Kidney:  15.6 cm in length.  Increased echogenicity of the renal parenchyma suggesting renal medical disease.  No hydronephrosis.  Left Kidney:  11.8 cm in length.  No hydronephrosis or mass. Detailed evaluation of the left kidney is suboptimal.  Abdominal aorta:  Maximal diameter 2.4 cm.  IMPRESSION: Hepatomegaly with hepatic steatosis. Slight increased echogenicity of the right renal parenchyma suggestive of renal medical disease. The left renal parenchyma is not well seen on this ultrasound but the kidney was not atrophic on the CT scan of 11/06/2011.  Original Report Authenticated By: Larey Seat, M.D.    Assessment [name] is a 45 y.o. AA female with altered mental status and FUO.  Has negative blood, urine and C.Diff cultures.  Exam most consistent with toxic metabolic/infectious etiology.  Cannot completely rule out seizure or cerebritis, although clinical picture does not support this.      Recommendations: 1. MRI head without contrast (due to renal fxn) 2. LP 3. Ammonia level 4. Consider EEG if remainder of w/u unremarkable.    Thank you for having [place] see [name] in consultation.  Will  follow with you.  Please call with any questions.  LOS: 7 days   Berlin Hun PA-C [facility] [phone] 11/11/2011  4:41 PM

## 2011-11-11 NOTE — Progress Notes (Signed)
Subjective: She is less responsive this morning. She continues to have fever. I had stopped vancomycin and tobramycin because of her kidney function also because both Rocephin and vancomycin had been on board now for about 10 days and should have been adequate treatment for her cellulitis. Her cellulitis looks much better. However she has continued having fever and this morning she is very sluggishly responsive. Her renal function is continuing to get slightly worse.  Objective: Vital signs in last 24 hours: Temp:  [99.6 F (37.6 C)-102.4 F (39.1 C)] 101.3 F (38.5 C) (11/22 5638) Pulse Rate:  [74-89] 85  (11/22 0603) Resp:  [18-24] 20  (11/22 0603) BP: (123-176)/(76-120) 144/91 mmHg (11/22 0603) SpO2:  [97 %-99 %] 97 % (11/22 0603) Weight change:  Last BM Date: 11/11/11  Intake/Output from previous day: 11/21 0701 - 11/22 0700 In: 360 [P.O.:360] Out: 550 [Urine:550]  PHYSICAL EXAM General appearance: no distress and morbidly obese Resp: clear to auscultation bilaterally Cardio: regular rate and rhythm, S1, S2 normal, no murmur, click, rub or gallop GI: soft, non-tender; bowel sounds normal; no masses,  no organomegaly Extremities: extremities normal, atraumatic, no cyanosis or edema  Lab Results:    Basic Metabolic Panel:  Basename 11/11/11 0620 11/10/11 0602  NA 136 134*  K 4.8 5.4*  CL 109 103  CO2 22 23  GLUCOSE 94 118*  BUN 24* 23  CREATININE 2.45* 2.40*  CALCIUM 7.6* 8.3*  MG -- --  PHOS -- --   Liver Function Tests:  [facility] 11/11/11 0620 11/10/11 0602  AST 49* 64*  ALT 33 44*  ALKPHOS 304* 370*  BILITOT 0.1* 0.2*  PROT 5.7* 6.3  ALBUMIN 1.8* 2.0*    Basename 11/09/11 0512  LIPASE --  AMYLASE 102   No results found for this basename: AMMONIA:2 in the last 72 hours CBC:  Basename 11/11/11 0620 11/10/11 0602  WBC 4.1 3.4*  NEUTROABS 3.0 2.4  HGB 8.8* 9.2*  HCT 26.9* 28.6*  MCV 87.1 87.5  PLT 280 315   Cardiac Enzymes: No results found  for this basename: CKTOTAL:3,CKMB:3,CKMBINDEX:3,TROPONINI:3 in the last 72 hours BNP: No results found for this basename: POCBNP:3 in the last 72 hours D-Dimer: No results found for this basename: DDIMER:2 in the last 72 hours CBG:  Basename 11/11/11 0740 11/10/11 2236 11/10/11 1626 11/10/11 1136 11/10/11 0755 11/09/11 2047  GLUCAP 94 99 96 114* 117* 128*   Hemoglobin A1C: No results found for this basename: HGBA1C in the last 72 hours Fasting Lipid Panel: No results found for this basename: CHOL,HDL,LDLCALC,TRIG,CHOLHDL,LDLDIRECT in the last 72 hours Thyroid Function Tests: No results found for this basename: TSH,T4TOTAL,FREET4,T3FREE,THYROIDAB in the last 72 hours Anemia Panel: No results found for this basename: VITAMINB12,FOLATE,FERRITIN,TIBC,IRON,RETICCTPCT in the last 72 hours Coagulation: No results found for this basename: LABPROT:2,INR:2 in the last 72 hours Urine Drug Screen:  Alcohol Level: No results found for this basename: ETH:2 in the last 72 hours Urinalysis:  Misc. Labs:  ABGS No results found for this basename: PHART,PCO2,PO2ART,TCO2,HCO3 in the last 72 hours CULTURES Recent Results (from the past 240 hour(s))  URINE CULTURE     Status: Normal   Collection Time   11/05/11  4:13 PM      Component Value Range Status Comment   Specimen Description URINE, CLEAN CATCH   Final    Special Requests NONE   Final    Setup Time [id number]   Final    Colony Count 8,000 COLONIES/ML   Final    Culture  INSIGNIFICANT GROWTH   Final    Report Status 11/07/2011 FINAL   Final   CULTURE, BLOOD (ROUTINE X 2)     Status: Normal   Collection Time   11/06/11 10:40 AM      Component Value Range Status Comment   Specimen Description BLOOD PICC LINE DRAWN BY RN   Final    Special Requests BOTTLES DRAWN AEROBIC AND ANAEROBIC [place]   Final    Culture NO GROWTH 5 DAYS   Final    Report Status 11/11/2011 FINAL   Final   CULTURE, BLOOD (ROUTINE X 2)     Status: Normal    Collection Time   11/06/11 11:19 AM      Component Value Range Status Comment   Specimen Description BLOOD PICC LINE DRAWN BY RN   Final    Special Requests BOTTLES DRAWN AEROBIC AND ANAEROBIC [place]   Final    Culture NO GROWTH 5 DAYS   Final    Report Status 11/11/2011 FINAL   Final   CLOSTRIDIUM DIFFICILE BY PCR     Status: Normal   Collection Time   11/07/11  5:43 AM      Component Value Range Status Comment   C difficile by pcr NEGATIVE  NEGATIVE  Final   CULTURE, BLOOD (ROUTINE X 2)     Status: Normal (Preliminary result)   Collection Time   11/07/11  6:37 AM      Component Value Range Status Comment   Specimen Description BLOOD DRAWN BY RN PICC LINE   Final    Special Requests     Final    Value: BOTTLES DRAWN AEROBIC AND ANAEROBIC 6CC EACH BOTTLE   Culture NO GROWTH 4 DAYS   Final    Report Status PENDING   Incomplete    Studies/Results: US Abdomen Complete  11/10/2011  *RADIOLOGY REPORT*  Clinical Data:  Elevated liver function tests.  Nausea vomiting. Diabetes.  COMPLETE ABDOMINAL ULTRASOUND  Comparison:  CT scan dated 11/06/2011  Findings:  Gallbladder:  No gallstones, gallbladder wall thickening, or pericholecystic fluid.Negative sonographic Murphy's sign.  Common bile duct:  Normal.  4.9 mm in diameter.  Liver:  Hepatomegaly with diffuse increased echogenicity consistent with hepatic steatosis.  No focal lesions.  No biliary ductal dilatation.  IVC:  Normal.  Pancreas:  Normal.  Spleen:  Normal.  9.9 cm in length.  Right Kidney:  15.6 cm in length.  Increased echogenicity of the renal parenchyma suggesting renal medical disease.  No hydronephrosis.  Left Kidney:  11.8 cm in length.  No hydronephrosis or mass. Detailed evaluation of the left kidney is suboptimal.  Abdominal aorta:  Maximal diameter 2.4 cm.  IMPRESSION: Hepatomegaly with hepatic steatosis. Slight increased echogenicity of the right renal parenchyma suggestive of renal medical disease. The left renal parenchyma is not  well seen on this ultrasound but the kidney was not atrophic on the CT scan of 11/06/2011.  Original Report Authenticated By: Larey Seat, M.D.    Medications:  Scheduled:   . ciprofloxacin  400 mg Intravenous Q12H  . enoxaparin  80 mg Subcutaneous Q24H  . feeding supplement  237 mL Oral TID WC  . feeding supplement  1 Container Oral BID BM  . [place]ora-Q  1 capsule Oral Daily  . fluconazole  100 mg Oral Daily  . imipenem-cilastatin  500 mg Intravenous Q8H  . insulin aspart  0-20 Units Subcutaneous TID WC  . insulin aspart  0-5 Units Subcutaneous QHS  . insulin  aspart  5 Units Subcutaneous TID WC  . insulin glargine  20 Units Subcutaneous Daily  . linezolid  600 mg Intravenous Q12H  . metoCLOPramide (REGLAN) injection  5 mg Intravenous TID WC & HS  . metoprolol  5 mg Intravenous Q6H  . ondansetron  4 mg Intravenous QID  . pantoprazole (PROTONIX) IV  40 mg Intravenous Q12H  . sodium chloride  10 mL Intracatheter Q12H  . sodium chloride      . sodium polystyrene  30 g Oral Once  . DISCONTD: linezolid  600 mg Intravenous Q12H  . DISCONTD: linezolid  600 mg Intravenous Q12H  . DISCONTD: potassium chloride SA  20 mEq Oral TID  . DISCONTD: tobramycin  7 mg/kg (Order-Specific) Intravenous Q48H   Continuous:   . sodium chloride 1,000 mL (11/11/11 0343)   IOE:[id number], acetaminophen, ondansetron (ZOFRAN) IV, ondansetron, oxyCODONE-acetaminophen, phenol, promethazine, sodium chloride, zolpidem  Assesment: she continues to have fever. Her renal function is slightly worse. She is poorly responsive this morning. She is arousable she's just not as responsive as normally. I'm concerned now that she may have some sort of a CNS infection and we have not been able to treat it with current antibiotics. She was on Rocephin previously which does cross the blood-brain barrier. I think she's going to need lumbar puncture probably a neurology consult and infectious disease consult because she still  having fevers and I cannot provide any of those services here. Principal Problem:  *Fever of unknown origin Active Problems:  Lymphedema of lower extremity  Cellulitis of left leg  Diabetes mellitus  Morbid obesity    Plan: I will plan discussion with the hospitalist service at [facility] and see if they will accept her in transfer    LOS: 7 days   Chananya Canizalez L 11/11/2011, 9:07 AM

## 2011-11-11 NOTE — Progress Notes (Signed)
CXR report called per Margreta Journey in radiology- Streaky peri hilar and bibasilar opacity beyond that expected for atelectasis and bilateral pneumonia suspected

## 2011-11-11 NOTE — H&P (Signed)
PCP:  Alonza Bogus, MD   DOA:  11/04/2011 11:44 AM  Chief Complaint:  Persistent fever up to 1 F, unknown origin.  HPI: Pt is 45 year old female with medical history outlined below who presents to [facility] transferred from [facility] for further evaluation and management of fever of unknown origin. Review of records indicate that patient has been initially admitted to [facility] on November 5th for cellulitis treatment of left lower extremity. In addition records indicate the patient has received antibiotics prior to being admitted to [facility] in an outpatient setting but was unable to tolerate antibiotics secondary to abdominal pain and vomiting. During the course of hospitalization at [facility] she has received vancomycin and Rocephin and cellulitis has improved however patient continued to have persistent fever of up to [place]. Patient currently reports feeling slightly better compared to several days ago but feels persistent tightness in lower extremities. She denies chills, but admits to having poor appetite, fatigue, nausea. She denies vomiting but has not been able to eat anything other than clear liquids over the past 10 days. She denies chest pain, shortness of breath, no other abdominal or urinary concerns. She also denies any specific focal weakness, no headache, no neck stiffness, no visual changes. She reports having chronic lower extremity lymphedema and explains that it looks like it's currently at her baseline. She denies recent sick contacts or exposures, no history of STDs, no recent history of traveling.  Patient was transferred to [facility] for further evaluation of fever of unknown origin.  Allergies: Allergies  Allergen Reactions  . Contrast Media (Iodinated Diagnostic Agents) Swelling    Dye for cardiac cath.  . Pneumococcal Vaccines     Prior to Admission medications   Medication Sig Start Date End Date Taking? Authorizing Provider  Acetaminophen (RA FEVER  REDUCER/PAIN RELIEVER PO) Take 1 tablet by mouth as needed. For fever    Yes Historical Provider, MD  dextrose 5 % SOLN 50 mL with cefTRIAXone 1 G SOLR 1 g Inject 1 g into the vein daily. 11/01/11  Yes Alonza Bogus  heparin lock flush 100 UNIT/ML SOLN 2.5 mLs (250 Units total) by Intracatheter route once. 11/01/11  Yes Alonza Bogus  insulin lispro (HUMALOG) 100 UNIT/ML injection Inject 60 Units into the skin 2 (two) times daily.     Yes Historical Provider, MD  oxyCODONE-acetaminophen (PERCOCET) 5-325 MG per tablet Take 1 tablet by mouth 4 (four) times daily as needed. For pain    Yes Historical Provider, MD  potassium chloride SA (K-DUR,KLOR-CON) 20 MEQ tablet Take 20 mEq by mouth 3 (three) times daily.     Yes Historical Provider, MD  sodium chloride 0.9 % SOLN 500 mL with vancomycin 1000 MG SOLR 1,500 mg Inject 1,500 mg into the vein daily. 11/01/11  Yes Alonza Bogus  torsemide (DEMADEX) 100 MG tablet Take 50 mg by mouth 2 (two) times daily.     Yes Historical Provider, MD    Past Medical History  Diagnosis Date  . Diabetes mellitus   . Chronic pain   . Lymph edema   . Cellulitis   . Chiari malformation     s/p surgery  . S/P colonoscopy 05/26/2007    Dr. Laural Golden sigmoid diverticulosis random biopsies benign  . CAD (coronary artery disease)   . Hypercholesterolemia   . Hypertension   . Asthma   . S/P endoscopy 05/01/2009    Dr. Penelope Coop pill-induced esophageal ulcerations distal to midesophagus, 2 small ulcers in  the antrum of the stomach    Past Surgical History  Procedure Date  . Cesarean section      x 2  . Abdominal hysterectomy   . Tonsillectomy   . Adenoidectomy   . Thyroidectomy, partial   . Craniectomy suboccipital w/ cervical laminectomy / chiari     Social History:  reports that she has never smoked. She does not have any smokeless tobacco history on file. She reports that she does not drink alcohol or use illicit drugs.  Family History  Problem  Relation Age of Onset  . Colon cancer Mother 38  . Stroke Mother 23  . Coronary artery disease Mother     Review of Systems:  Per HPI  Physical Exam:  Filed Vitals:   11/11/11 0900 11/11/11 1003 11/11/11 1100 11/11/11 1240  BP:  170/94  154/102  Pulse:  86  75  Temp: 100.5 F (38.1 C) 101.1 F (38.4 C) 100.4 F (38 C) 100.2 F (37.9 C)  TempSrc: Oral Oral Oral Oral  Resp:  20  20  Height:      Weight:      SpO2:  97%  97%    Constitutional: Vital signs reviewed.  Patient is a pleasant and cooperative to examination, alert and oriented to name and place only. She follows commands appropriately and does not appear to be in acute booster Head: Normocephalic and atraumatic Ear: TM normal bilaterally Mouth: no erythema or exudates, dry mucous membranes,  Eyes: PERRL, EOMI, conjunctivae normal, No scleral icterus.  Neck: Supple, Trachea midline normal ROM, No JVD evident but difficult to examine secondary to body habitus, no mass, thyromegaly, or carotid bruit present.  Cardiovascular: RRR, S1 normal, S2 normal, no MRG, pulses symmetric and intact bilaterally. Very distant heart sounds Pulmonary/Chest: CTAB, no wheezes, rales, or rhonchi. Poor inspiratory effort, decreased breath sounds at base Abdominal: Soft. Non-tender, non-distended, bowel sounds are normal, no masses, organomegaly, or guarding present.  GU: no CVA tenderness Ext: Lower extremity lymphedema bilateral, nontender to palpation, pulses - unable to assess secondary to severe lymphedema Hematology: no cervical, inginal, or axillary adenopathy appreciate Neurological: A&O to name, place, date of birth, Strenght is normal and symmetric bilaterally, cranial nerve II-XII are grossly intact, no focal motor deficit, sensory intact to light touch bilaterally.  Psychiatric: Normal mood and affect. speech and behavior is normal. Judgment and thought content normal. Cognition and memory are normal.   Labs on Admission:    Results for orders placed during the hospital encounter of 11/04/11 (from the past 48 hour(s))  GLUCOSE, CAPILLARY     Status: Abnormal   Collection Time   11/09/11  4:53 PM      Component Value Range Comment   Glucose-Capillary 116 (*) 70 - 99 (mg/dL)    Comment 1 Notify RN      Comment 2 Documented in Chart     GLUCOSE, CAPILLARY     Status: Abnormal   Collection Time   11/09/11  8:47 PM      Component Value Range Comment   Glucose-Capillary 128 (*) 70 - 99 (mg/dL)    Comment 1 Notify RN      Comment 2 Documented in Chart     BASIC METABOLIC PANEL     Status: Abnormal   Collection Time   11/10/11  6:02 AM      Component Value Range Comment   Sodium 134 (*) 135 - 145 (mEq/L)    Potassium 5.4 (*) 3.5 - 5.1 (mEq/L)  Chloride 103  96 - 112 (mEq/L)    CO2 23  19 - 32 (mEq/L)    Glucose, Bld 118 (*) 70 - 99 (mg/dL)    BUN 23  6 - 23 (mg/dL)    Creatinine, Ser 2.40 (*) 0.50 - 1.10 (mg/dL)    Calcium 8.3 (*) 8.4 - 10.5 (mg/dL)    GFR calc non Af Amer 23 (*) >90 (mL/min)    GFR calc Af Amer 27 (*) >90 (mL/min)   CBC     Status: Abnormal   Collection Time   11/10/11  6:02 AM      Component Value Range Comment   WBC 3.4 (*) 4.0 - 10.5 (K/uL)    RBC 3.27 (*) 3.87 - 5.11 (MIL/uL)    Hemoglobin 9.2 (*) 12.0 - 15.0 (g/dL)    HCT 28.6 (*) 36.0 - 46.0 (%)    MCV 87.5  78.0 - 100.0 (fL)    MCH 28.1  26.0 - 34.0 (pg)    MCHC 32.2  30.0 - 36.0 (g/dL)    RDW 16.4 (*) 11.5 - 15.5 (%)    Platelets 315  150 - 400 (K/uL)   DIFFERENTIAL     Status: Normal   Collection Time   11/10/11  6:02 AM      Component Value Range Comment   Neutrophils Relative 69  43 - 77 (%)    Neutro Abs 2.4  1.7 - 7.7 (K/uL)    Lymphocytes Relative 21  12 - 46 (%)    Lymphs Abs 0.7  0.7 - 4.0 (K/uL)    Monocytes Relative 7  3 - 12 (%)    Monocytes Absolute 0.2  0.1 - 1.0 (K/uL)    Eosinophils Relative 2  0 - 5 (%)    Eosinophils Absolute 0.1  0.0 - 0.7 (K/uL)    Basophils Relative 1  0 - 1 (%)    Basophils  Absolute 0.0  0.0 - 0.1 (K/uL)   HEPATIC FUNCTION PANEL     Status: Abnormal   Collection Time   11/10/11  6:02 AM      Component Value Range Comment   Total Protein 6.3  6.0 - 8.3 (g/dL)    Albumin 2.0 (*) 3.5 - 5.2 (g/dL)    AST 64 (*) 0 - 37 (U/L)    ALT 44 (*) 0 - 35 (U/L)    Alkaline Phosphatase 370 (*) 39 - 117 (U/L)    Total Bilirubin 0.2 (*) 0.3 - 1.2 (mg/dL)    Bilirubin, Direct 0.1  0.0 - 0.3 (mg/dL)    Indirect Bilirubin 0.1 (*) 0.3 - 0.9 (mg/dL)   GLUCOSE, CAPILLARY     Status: Abnormal   Collection Time   11/10/11  7:55 AM      Component Value Range Comment   Glucose-Capillary 117 (*) 70 - 99 (mg/dL)   GLUCOSE, CAPILLARY     Status: Abnormal   Collection Time   11/10/11 11:36 AM      Component Value Range Comment   Glucose-Capillary 114 (*) 70 - 99 (mg/dL)   GLUCOSE, CAPILLARY     Status: Normal   Collection Time   11/10/11  4:26 PM      Component Value Range Comment   Glucose-Capillary 96  70 - 99 (mg/dL)   GLUCOSE, CAPILLARY     Status: Normal   Collection Time   11/10/11 10:36 PM      Component Value Range Comment   Glucose-Capillary 99  70 - 99 (  mg/dL)   BASIC METABOLIC PANEL     Status: Abnormal   Collection Time   11/11/11  6:20 AM      Component Value Range Comment   Sodium 136  135 - 145 (mEq/L)    Potassium 4.8  3.5 - 5.1 (mEq/L)    Chloride 109  96 - 112 (mEq/L)    CO2 22  19 - 32 (mEq/L)    Glucose, Bld 94  70 - 99 (mg/dL)    BUN 24 (*) 6 - 23 (mg/dL)    Creatinine, Ser 2.45 (*) 0.50 - 1.10 (mg/dL)    Calcium 7.6 (*) 8.4 - 10.5 (mg/dL)    GFR calc non Af Amer 23 (*) >90 (mL/min)    GFR calc Af Amer 26 (*) >90 (mL/min)   CBC     Status: Abnormal   Collection Time   11/11/11  6:20 AM      Component Value Range Comment   WBC 4.1  4.0 - 10.5 (K/uL)    RBC 3.09 (*) 3.87 - 5.11 (MIL/uL)    Hemoglobin 8.8 (*) 12.0 - 15.0 (g/dL)    HCT 26.9 (*) 36.0 - 46.0 (%)    MCV 87.1  78.0 - 100.0 (fL)    MCH 28.5  26.0 - 34.0 (pg)    MCHC 32.7  30.0 -  36.0 (g/dL)    RDW 16.7 (*) 11.5 - 15.5 (%)    Platelets 280  150 - 400 (K/uL)   DIFFERENTIAL     Status: Normal   Collection Time   11/11/11  6:20 AM      Component Value Range Comment   Neutrophils Relative 75  43 - 77 (%)    Neutro Abs 3.0  1.7 - 7.7 (K/uL)    Lymphocytes Relative 17  12 - 46 (%)    Lymphs Abs 0.7  0.7 - 4.0 (K/uL)    Monocytes Relative 6  3 - 12 (%)    Monocytes Absolute 0.2  0.1 - 1.0 (K/uL)    Eosinophils Relative 2  0 - 5 (%)    Eosinophils Absolute 0.1  0.0 - 0.7 (K/uL)    Basophils Relative 1  0 - 1 (%)    Basophils Absolute 0.0  0.0 - 0.1 (K/uL)   HEPATIC FUNCTION PANEL     Status: Abnormal   Collection Time   11/11/11  6:20 AM      Component Value Range Comment   Total Protein 5.7 (*) 6.0 - 8.3 (g/dL)    Albumin 1.8 (*) 3.5 - 5.2 (g/dL)    AST 49 (*) 0 - 37 (U/L)    ALT 33  0 - 35 (U/L)    Alkaline Phosphatase 304 (*) 39 - 117 (U/L)    Total Bilirubin 0.1 (*) 0.3 - 1.2 (mg/dL)    Bilirubin, Direct 0.1  0.0 - 0.3 (mg/dL)    Indirect Bilirubin 0.0 (*) 0.3 - 0.9 (mg/dL)   GLUCOSE, CAPILLARY     Status: Normal   Collection Time   11/11/11  7:40 AM      Component Value Range Comment   Glucose-Capillary 94  70 - 99 (mg/dL)   OCCULT BLOOD X 1 CARD TO LAB, STOOL     Status: Normal   Collection Time   11/11/11 12:23 PM      Component Value Range Comment   Fecal Occult Bld POSITIVE       Radiological Exams on Admission: Dg Chest Portable 1 View 11/04/2011  IMPRESSION:  Right PICC line tip in lower SVC.  No pneumothorax.    Abd [place]  11/10/2011 IMPRESSION:  Hepatomegaly with hepatic steatosis. Slight increased echogenicity  of the right renal parenchyma suggestive of renal medical disease.  The left renal parenchyma is not well seen on this ultrasound but  the kidney was not atrophic on the CT scan of 11/06/2011.  CT abd/pelvis 11/06/2011 IMPRESSION:  Evaluation is constrained by artifact related to body habitus and  lack of intravenous contrast.   No evidence of intra-abdominal abscess.  Retroperitoneal/left pelvic lymphadenopathy, as described above.  Soft tissue stranding in the left lateral abdominal wall,  incompletely visualized.   Assessment/Plan Principal Problem:  *Altered mental state  - Unclear etiology at this time and per patient's husband patient is not at her baseline - This can certainly be multifactorial in etiology with differential including persistent fever, encephalopathy/meningitis (acute bacterial versus viral infection), HIV - I have discussed this case with Dr. [name] who is on call for infectious disease, his recommendation was to proceed with the pain the MRI of the brain and at this time we'll have to obtain MRI without contrast given pt's worsening renal function, we'll also place order for interventional radiology to do LP - We will order spinal fluid analysis including opening pressure, protein, glucose, cell count and differential, HSV PCR - obtain MRI of the head, ammonia level - neuro consult called, appreciate input  LE cellulitis - pt was initially admitted to [facility] for treatment of left lower extremity cellulitis and per records review has been on Rocephin and Vancomycin - Please note that Rocephin and vancomycin were discontinued at [facility] on November 21st, 2012 - Cellulitis now appears to be resolved   Fever of unknown origin - Unclear etiology, please see principal problem for further evaluation and management - Proceed with infectious etiology workup as noted in primary problem - Will also order blood work for vasculitis work up   Acute renal failure - Patient appears clinically dry. We will start her on normal saline at 125 cc per hour as recommended per nephrology - I spoke with Dr. Posey Pronto and he has aggred with vasculitis work up, ANA, ANCA, complement levels, CRP, ESR, UA and Urine Cx - continue IVF and obtain BMP in AM - advise was to call renal service if renal function worsening after  implementing above's measures   Anemia - FOBT positive - pt seen by GI at [facility] and they have started her on Reglan for nausea - nausea and vomiting was thought to be secondary to gastroparesis - gastric emptying study pending   Diabetes mellitus - uncontrolled - Obtain A1c - Continue sliding scale insulin and Lantus   Morbid obesity - BMI over 40   Hyperkalemia - No acidosis, potassium now within normal limit   Leukopenia - Now resolved and at baseline within normal   Transaminitis - Unclear etiology - Obtain hepatitis panel and CMET in the morning   Lymphedema of lower extremity - severe and chronic - per pt's report now at her baseline   HTN (hypertension) - continue same medication regimen   HLD (hyperlipidemia) - chronic  Plan of treatment and care was discussed with patient and husband who was present at baseline. He has verbalized understanding and agrees with the plan. Patient is full code  Time Spent on Admission: Over 60 minutes  MAGICK-Judith Campillo 11/11/2011, 2:39 PM

## 2011-11-12 ENCOUNTER — Inpatient Hospital Stay (HOSPITAL_COMMUNITY): Payer: PRIVATE HEALTH INSURANCE

## 2011-11-12 ENCOUNTER — Other Ambulatory Visit (HOSPITAL_COMMUNITY): Payer: PRIVATE HEALTH INSURANCE

## 2011-11-12 DIAGNOSIS — R509 Fever, unspecified: Secondary | ICD-10-CM

## 2011-11-12 DIAGNOSIS — G9340 Encephalopathy, unspecified: Secondary | ICD-10-CM

## 2011-11-12 LAB — CBC
HCT: 28.8 % — ABNORMAL LOW (ref 36.0–46.0)
Hemoglobin: 9.5 g/dL — ABNORMAL LOW (ref 12.0–15.0)
MCH: 28.1 pg (ref 26.0–34.0)
MCHC: 33 g/dL (ref 30.0–36.0)
MCV: 85.2 fL (ref 78.0–100.0)
RBC: 3.38 MIL/uL — ABNORMAL LOW (ref 3.87–5.11)

## 2011-11-12 LAB — URINALYSIS, ROUTINE W REFLEX MICROSCOPIC
Nitrite: NEGATIVE
Specific Gravity, Urine: 1.019 (ref 1.005–1.030)
Urobilinogen, UA: 0.2 mg/dL (ref 0.0–1.0)

## 2011-11-12 LAB — COMPREHENSIVE METABOLIC PANEL
ALT: 31 U/L (ref 0–35)
AST: 58 U/L — ABNORMAL HIGH (ref 0–37)
Albumin: 1.9 g/dL — ABNORMAL LOW (ref 3.5–5.2)
CO2: 21 mEq/L (ref 19–32)
Calcium: 8.1 mg/dL — ABNORMAL LOW (ref 8.4–10.5)
GFR calc non Af Amer: 20 mL/min — ABNORMAL LOW (ref >90)
Sodium: 135 mEq/L (ref 135–145)

## 2011-11-12 LAB — CARDIAC PANEL(CRET KIN+CKTOT+MB+TROPI)
Relative Index: INVALID (ref 0.0–2.5)
Total CK: 42 U/L (ref 7–177)
Total CK: 38 U/L (ref 7–177)

## 2011-11-12 LAB — HIV ANTIBODY (ROUTINE TESTING W REFLEX): HIV: NONREACTIVE

## 2011-11-12 LAB — GLUCOSE, CAPILLARY: Glucose-Capillary: 111 mg/dL — ABNORMAL HIGH (ref 70–99)

## 2011-11-12 MED ORDER — OXYCODONE-ACETAMINOPHEN 5-325 MG PO TABS
1.0000 | ORAL_TABLET | Freq: Four times a day (QID) | ORAL | Status: DC | PRN
  Administered 2011-11-12 – 2011-11-21 (×8): 1 via ORAL
  Filled 2011-11-12 (×8): qty 1

## 2011-11-12 MED ORDER — INSULIN GLARGINE 100 UNIT/ML ~~LOC~~ SOLN
10.0000 [IU] | Freq: Every day | SUBCUTANEOUS | Status: DC
  Administered 2011-11-13 – 2011-11-22 (×10): 10 [IU] via SUBCUTANEOUS
  Filled 2011-11-12: qty 3

## 2011-11-12 NOTE — Progress Notes (Addendum)
RN attempted to place foley without success.

## 2011-11-12 NOTE — Consult Note (Addendum)
Date of Admission:  11/04/2011  Date of Consult:  11/12/2011  Reason for Consult FUO and confusion Referring Physician: Dr. Retia [name] [name] is an 45 y.o. female. With diabetes mellitus and coronary artery disease and chronic lymphedema who was admitted to [facility] with cellulitis involving her left lower extremity. She was treated with intravenous antibiotics  vancomycin and Zosyn while at any [facility] was  discharged to home  with IV ceftriaxone and vancomycin. While, she felt well but approximately 3 days after discharge she developed fevers malaise nausea and vomiting. She was readmitted to [facility] and started on Achromycin Zosyn. She continued to have fevers and ciprofloxacin was added as was tobramycin. Despite all these various antibacterial antibiotics the patient continued to have high-grade fevers as well as intermittent confusion and largely correlated with her fevers  without any pain she had a CT of the chest that was unrevealing a CT of the abdomen and pelvis that did show some pelvic lymphadenopathy. There is no obvious intra-abdominal abscess found on exam.  Blood cultures and urine cultures were all negative for C. difficile by PCR was negative . She was ultimately transferred over to [name] for further evaluation and treatment. The admitting physician Dr. Burnard [name] contacted me yesterday about the patient and I agreed with plans for a lumbar puncture off of antibiotics to send for cell count differential protein glucose and herpes simplex by PCR. I also agree with getting an MRI of the brain as well. Unfortunately MRI was not obtained because the patient is to overweight to be put on the scanner. Additionally no lumbar puncture was attempted in part because interventional radiology did not want to do a lumbar puncture with her having had Lovenox within the past 24 hours. In the interval she is been placed on antibacterial therapy in the form of ceftazidime and (although  I don't feel she has an indication for antibacterial therapy at this point time and per disease as well as acyclovir. I did consult his assistant workup and management of this patient with fever of unknown origin and confusion. I did offer the patient lumbar puncture at the bedside and after lengthy discussion of the risks and benefits ultimately the patient and her husband refused to consent to allow me to do it at the bedside and prefer to have her have a lumbar puncture performed by interventional radiology. I spent greater than 60 minutes with the patient including greater than 50% of time in face to face counsel of the patient and in coordination of their care.    Past Medical History  Diagnosis Date  . Diabetes mellitus   . Chronic pain   . Lymph edema   . Cellulitis   . Chiari malformation     s/p surgery  . S/P colonoscopy 05/26/2007    Dr. Laural [name] sigmoid diverticulosis random biopsies benign  . CAD (coronary artery disease)   . Hypercholesterolemia   . Hypertension   . Asthma   . S/P endoscopy 05/01/2009    Dr. Penelope [name] pill-induced esophageal ulcerations distal to midesophagus, 2 small ulcers in the antrum of the stomach    Past Surgical History  Procedure Date  . Cesarean section      x 2  . Abdominal hysterectomy   . Tonsillectomy   . Adenoidectomy   . Thyroidectomy, partial   . Craniectomy suboccipital w/ cervical laminectomy / chiari   ergies:   Allergies  Allergen Reactions  . Contrast Media (  Iodinated Diagnostic Agents) Swelling    Dye for cardiac cath.  . Pneumococcal Vaccines     Medications: I have reviewed the patient's current medications. Anti-infectives     Start     Dose/Rate Route Frequency Ordered Stop   11/11/11 2359   ciprofloxacin (CIPRO) IVPB 400 mg  Status:  Discontinued        400 mg 200 mL/hr over 60 Minutes Intravenous Every 12 hours 11/11/11 2157 11/12/11 1519   11/11/11 2300   cefTAZidime (FORTAZ) 1 g in dextrose 5 % 50 mL IVPB  Status:   Discontinued        1 g 100 mL/hr over 30 Minutes Intravenous 3 times per day 11/11/11 2157 11/12/11 1519   11/11/11 1800   acyclovir (ZOVIRAX) 570 mg in dextrose 5 % 100 mL IVPB        10 mg/kg  57 kg (Ideal) 111.4 mL/hr over 60 Minutes Intravenous Every 8 hours 11/11/11 1726     11/11/11 1645   acyclovir (ZOVIRAX) 1,720 mg in dextrose 5 % 250 mL IVPB  Status:  Discontinued        10 mg/kg  171.9 kg 284.4 mL/hr over 60 Minutes Intravenous 3 times per day 11/11/11 1643 11/11/11 1722   11/11/11 1200   linezolid (ZYVOX) IVPB 600 mg  Status:  Discontinued        600 mg 300 mL/hr over 60 Minutes Intravenous Every 12 hours 11/11/11 0905 11/11/11 1643   11/11/11 1100   imipenem-cilastatin (PRIMAXIN) 500 mg in sodium chloride 0.9 % 100 mL IVPB  Status:  Discontinued        500 mg 200 mL/hr over 30 Minutes Intravenous Every 8 hours 11/11/11 0900 11/11/11 1643   11/11/11 1000   linezolid (ZYVOX) IVPB 600 mg  Status:  Discontinued        600 mg 300 mL/hr over 60 Minutes Intravenous Every 12 hours 11/11/11 0857 11/11/11 0902   11/11/11 0915   linezolid (ZYVOX) IVPB 600 mg  Status:  Discontinued        600 mg 300 mL/hr over 60 Minutes Intravenous Every 12 hours 11/11/11 0903 11/11/11 0904   11/10/11 1745   fluconazole (DIFLUCAN) tablet 100 mg  Status:  Discontinued        100 mg Oral Daily 11/10/11 1742 11/12/11 1521   11/09/11 2100   tobramycin (NEBCIN) 620 mg in dextrose 5 % 100 mL IVPB  Status:  Discontinued        7 mg/kg  88.5 kg (Order-Specific) 115.5 mL/hr over 60 Minutes Intravenous Every 48 hours 11/08/11 1018 11/10/11 0948   11/09/11 1000   vancomycin (VANCOCIN) 1,500 mg in sodium chloride 0.9 % 500 mL IVPB  Status:  Discontinued        1,500 mg 250 mL/hr over 120 Minutes Intravenous Every 24 hours 11/08/11 1015 11/10/11 0819   11/07/11 2300   tobramycin (NEBCIN) 620 mg in dextrose 5 % 100 mL IVPB  Status:  Discontinued        7 mg/kg  88.5 kg (Order-Specific) 115.5 mL/hr  over 60 Minutes Intravenous Every 36 hours 11/07/11 0757 11/08/11 1018   11/06/11 2100   vancomycin (VANCOCIN) IVPB 1000 mg/200 mL premix  Status:  Discontinued        1,000 mg 200 mL/hr over 60 Minutes Intravenous Every 12 hours 11/06/11 0841 11/08/11 0937   11/06/11 1230   tobramycin (NEBCIN) 620 mg in dextrose 5 % 100 mL IVPB  Status:  Discontinued  7 mg/kg  88.5 kg (Order-Specific) 115.5 mL/hr over 60 Minutes Intravenous Every 24 hours 11/06/11 1133 11/07/11 0757   11/05/11 1000   ciprofloxacin (CIPRO) IVPB 400 mg  Status:  Discontinued        400 mg 200 mL/hr over 60 Minutes Intravenous Every 12 hours 11/05/11 0938 11/11/11 1643   11/04/11 2200   vancomycin (VANCOCIN) IVPB 1000 mg/200 mL premix  Status:  Discontinued        1,000 mg 200 mL/hr over 60 Minutes Intravenous Every 8 hours 11/04/11 1914 11/06/11 0835   11/04/11 2000   cefTRIAXone (ROCEPHIN) 1 g in dextrose 5 % 50 mL IVPB  Status:  Discontinued        1 g 100 mL/hr over 30 Minutes Intravenous Every 24 hours 11/04/11 1846 11/10/11 4098          Social History:  reports that she has never smoked. She does not have any smokeless tobacco history on file. She reports that she does not drink alcohol or use illicit drugs.  Family History  Problem Relation Age of Onset  . Colon cancer Mother 75  . Stroke Mother 53  . Coronary artery disease Mother     As in HPI and primary teams notes otherwise 12 point review of systems is negative  Blood pressure 171/90, pulse 100, temperature 99.4 F (37.4 C), temperature source Oral, resp. rate 27, height _0  (1.651 m), weight 388 lb 0.2 oz (176 kg), SpO2 98.00%. General: Alert and awake, oriented x3, not in any acute distress. Morbidly obese HEENT: anicteric sclera, pupils reactive to light and accommodation, EOMI CVS distant heart sounds, regular  rate, normal r,  no murmur rubs or gallops Chest: clear to auscultation bilaterally, no wheezing, rales or rhonchi Abdomen:  soft nontender, nondistended, normal bowel sounds, Extremities: severe bilateterl lymphedema, could not appreciate any intense erythema or tenderness to palpation in the folds of her skin in her left large family her right lower extremity anteriorly or posteriorly. Skin: no rashes Lymph: no new lymphadenopathy Neuro: nonfocal, strength and sensation intact.    Results for orders placed during the hospital encounter of 11/04/11 (from the past 48 hour(s))  GLUCOSE, CAPILLARY     Status: Normal   Collection Time   11/10/11  4:26 PM      Component Value Range Comment   Glucose-Capillary 96  70 - 99 (mg/dL)   GLUCOSE, CAPILLARY     Status: Normal   Collection Time   11/10/11 10:36 PM      Component Value Range Comment   Glucose-Capillary 99  70 - 99 (mg/dL)   BASIC METABOLIC PANEL     Status: Abnormal   Collection Time   11/11/11  6:20 AM      Component Value Range Comment   Sodium 136  135 - 145 (mEq/L)    Potassium 4.8  3.5 - 5.1 (mEq/L)    Chloride 109  96 - 112 (mEq/L)    CO2 22  19 - 32 (mEq/L)    Glucose, Bld 94  70 - 99 (mg/dL)    BUN 24 (*) 6 - 23 (mg/dL)    Creatinine, Ser 2.45 (*) 0.50 - 1.10 (mg/dL)    Calcium 7.6 (*) 8.4 - 10.5 (mg/dL)    GFR calc non Af Amer 23 (*) >90 (mL/min)    GFR calc Af Amer 26 (*) >90 (mL/min)   CBC     Status: Abnormal   Collection Time   11/11/11  6:20 AM  Component Value Range Comment   WBC 4.1  4.0 - 10.5 (K/uL)    RBC 3.09 (*) 3.87 - 5.11 (MIL/uL)    Hemoglobin 8.8 (*) 12.0 - 15.0 (g/dL)    HCT 26.9 (*) 36.0 - 46.0 (%)    MCV 87.1  78.0 - 100.0 (fL)    MCH 28.5  26.0 - 34.0 (pg)    MCHC 32.7  30.0 - 36.0 (g/dL)    RDW 16.7 (*) 11.5 - 15.5 (%)    Platelets 280  150 - 400 (K/uL)   DIFFERENTIAL     Status: Normal   Collection Time   11/11/11  6:20 AM      Component Value Range Comment   Neutrophils Relative 75  43 - 77 (%)    Neutro Abs 3.0  1.7 - 7.7 (K/uL)    Lymphocytes Relative 17  12 - 46 (%)    Lymphs Abs 0.7  0.7 - 4.0  (K/uL)    Monocytes Relative 6  3 - 12 (%)    Monocytes Absolute 0.2  0.1 - 1.0 (K/uL)    Eosinophils Relative 2  0 - 5 (%)    Eosinophils Absolute 0.1  0.0 - 0.7 (K/uL)    Basophils Relative 1  0 - 1 (%)    Basophils Absolute 0.0  0.0 - 0.1 (K/uL)   HEPATIC FUNCTION PANEL     Status: Abnormal   Collection Time   11/11/11  6:20 AM      Component Value Range Comment   Total Protein 5.7 (*) 6.0 - 8.3 (g/dL)    Albumin 1.8 (*) 3.5 - 5.2 (g/dL)    AST 49 (*) 0 - 37 (U/L)    ALT 33  0 - 35 (U/L)    Alkaline Phosphatase 304 (*) 39 - 117 (U/L)    Total Bilirubin 0.1 (*) 0.3 - 1.2 (mg/dL)    Bilirubin, Direct 0.1  0.0 - 0.3 (mg/dL)    Indirect Bilirubin 0.0 (*) 0.3 - 0.9 (mg/dL)   GLUCOSE, CAPILLARY     Status: Normal   Collection Time   11/11/11  7:40 AM      Component Value Range Comment   Glucose-Capillary 94  70 - 99 (mg/dL)   OCCULT BLOOD X 1 CARD TO LAB, STOOL     Status: Normal   Collection Time   11/11/11 12:23 PM      Component Value Range Comment   Fecal Occult Bld POSITIVE     MRSA PCR SCREENING     Status: Normal   Collection Time   11/11/11  2:39 PM      Component Value Range Comment   MRSA by PCR NEGATIVE  NEGATIVE    CBC     Status: Abnormal   Collection Time   11/11/11  4:00 PM      Component Value Range Comment   WBC 4.6  4.0 - 10.5 (K/uL)    RBC 3.38 (*) 3.87 - 5.11 (MIL/uL)    Hemoglobin 9.5 (*) 12.0 - 15.0 (g/dL)    HCT 28.8 (*) 36.0 - 46.0 (%)    MCV 85.2  78.0 - 100.0 (fL)    MCH 28.1  26.0 - 34.0 (pg)    MCHC 33.0  30.0 - 36.0 (g/dL)    RDW 16.7 (*) 11.5 - 15.5 (%)    Platelets 303  150 - 400 (K/uL)   MAGNESIUM     Status: Normal   Collection Time   11/11/11  4:00 PM  Component Value Range Comment   Magnesium 1.8  1.5 - 2.5 (mg/dL)   PHOSPHORUS     Status: Normal   Collection Time   11/11/11  4:00 PM      Component Value Range Comment   Phosphorus 3.9  2.3 - 4.6 (mg/dL)   DIFFERENTIAL     Status: Normal   Collection Time   11/11/11  4:00  PM      Component Value Range Comment   Neutrophils Relative 72  43 - 77 (%)    Neutro Abs 3.3  1.7 - 7.7 (K/uL)    Lymphocytes Relative 19  12 - 46 (%)    Lymphs Abs 0.9  0.7 - 4.0 (K/uL)    Monocytes Relative 7  3 - 12 (%)    Monocytes Absolute 0.3  0.1 - 1.0 (K/uL)    Eosinophils Relative 2  0 - 5 (%)    Eosinophils Absolute 0.1  0.0 - 0.7 (K/uL)    Basophils Relative 0  0 - 1 (%)    Basophils Absolute 0.0  0.0 - 0.1 (K/uL)   APTT     Status: Abnormal   Collection Time   11/11/11  4:00 PM      Component Value Range Comment   aPTT 42 (*) 24 - 37 (seconds)   PROTIME-INR     Status: Normal   Collection Time   11/11/11  4:00 PM      Component Value Range Comment   Prothrombin Time 14.2  11.6 - 15.2 (seconds)    INR 1.08  0.00 - 1.49    PRO B NATRIURETIC PEPTIDE     Status: Abnormal   Collection Time   11/11/11  4:00 PM      Component Value Range Comment   BNP, POC 497.9 (*) 0 - 125 (pg/mL)   CARDIAC PANEL(CRET KIN+CKTOT+MB+TROPI)     Status: Normal   Collection Time   11/11/11  4:00 PM      Component Value Range Comment   Total CK 34  7 - 177 (U/L)    CK, MB 1.3  0.3 - 4.0 (ng/mL)    Troponin I <0.30  <0.30 (ng/mL)    Relative Index RELATIVE INDEX IS INVALID  0.0 - 2.5    HEMOGLOBIN A1C     Status: Abnormal   Collection Time   11/11/11  4:00 PM      Component Value Range Comment   Hemoglobin A1C 10.2 (*) <5.7 (%)    Mean Plasma Glucose 246 (*) <117 (mg/dL)   BASIC METABOLIC PANEL     Status: Abnormal   Collection Time   11/11/11  4:00 PM      Component Value Range Comment   Sodium 134 (*) 135 - 145 (mEq/L)    Potassium 5.2 (*) 3.5 - 5.1 (mEq/L)    Chloride 104  96 - 112 (mEq/L)    CO2 20  19 - 32 (mEq/L)    Glucose, Bld 92  70 - 99 (mg/dL)    BUN 27 (*) 6 - 23 (mg/dL)    Creatinine, Ser 2.58 (*) 0.50 - 1.10 (mg/dL)    Calcium 7.9 (*) 8.4 - 10.5 (mg/dL)    GFR calc non Af Amer 21 (*) >90 (mL/min)    GFR calc Af Amer 25 (*) >90 (mL/min)   SEDIMENTATION RATE      Status: Abnormal   Collection Time   11/11/11  4:00 PM      Component Value Range Comment  Sed Rate 127 (*) 0 - 22 (mm/hr)   LACTATE DEHYDROGENASE     Status: Abnormal   Collection Time   11/11/11  4:00 PM      Component Value Range Comment   LD 422 (*) 94 - 250 (U/L)   VANCOMYCIN, TROUGH     Status: Normal   Collection Time   11/11/11  4:00 PM      Component Value Range Comment   Vancomycin Tr 11.1  10.0 - 20.0 (ug/mL)   GLUCOSE, CAPILLARY     Status: Normal   Collection Time   11/11/11  5:14 PM      Component Value Range Comment   Glucose-Capillary 91  70 - 99 (mg/dL)    Comment 1 Documented in Chart      Comment 2 Notify RN     AMMONIA     Status: Normal   Collection Time   11/11/11  9:07 PM      Component Value Range Comment   Ammonia 47  11 - 60 (umol/L)   PROCALCITONIN     Status: Normal   Collection Time   11/11/11  9:08 PM      Component Value Range Comment   Procalcitonin 0.27     ANA     Status: Normal   Collection Time   11/11/11  9:08 PM      Component Value Range Comment   ANA NEGATIVE  NEGATIVE    C3 COMPLEMENT     Status: Normal   Collection Time   11/11/11  9:08 PM      Component Value Range Comment   C3 Complement 145  90 - 180 (mg/dL)   C4 COMPLEMENT     Status: Normal   Collection Time   11/11/11  9:08 PM      Component Value Range Comment   Complement C4, Body Fluid 40  10 - 40 (mg/dL)   C-REACTIVE PROTEIN     Status: Abnormal   Collection Time   11/11/11  9:08 PM      Component Value Range Comment   CRP 0.81 (*) <0.60 (mg/dL)   HIV ANTIBODY (ROUTINE TESTING)     Status: Normal   Collection Time   11/11/11  9:08 PM      Component Value Range Comment   HIV NON REACTIVE  NON REACTIVE    TSH     Status: Normal   Collection Time   11/11/11  9:08 PM      Component Value Range Comment   TSH 2.983  0.350 - 4.500 (uIU/mL)   GLUCOSE, CAPILLARY     Status: Normal   Collection Time   11/11/11 10:54 PM      Component Value Range Comment    Glucose-Capillary 93  70 - 99 (mg/dL)    Comment 1 Documented in Chart      Comment 2 Notify RN     CARDIAC PANEL(CRET KIN+CKTOT+MB+TROPI)     Status: Normal   Collection Time   11/12/11  2:30 AM      Component Value Range Comment   Total CK 42  7 - 177 (U/L)    CK, MB 1.4  0.3 - 4.0 (ng/mL)    Troponin I <0.30  <0.30 (ng/mL)    Relative Index RELATIVE INDEX IS INVALID  0.0 - 2.5    COMPREHENSIVE METABOLIC PANEL     Status: Abnormal   Collection Time   11/12/11  6:00 AM      Component Value  Range Comment   Sodium 135  135 - 145 (mEq/L)    Potassium 4.9  3.5 - 5.1 (mEq/L)    Chloride 104  96 - 112 (mEq/L)    CO2 21  19 - 32 (mEq/L)    Glucose, Bld 113 (*) 70 - 99 (mg/dL)    BUN 27 (*) 6 - 23 (mg/dL)    Creatinine, Ser 2.67 (*) 0.50 - 1.10 (mg/dL)    Calcium 8.1 (*) 8.4 - 10.5 (mg/dL)    Total Protein 5.9 (*) 6.0 - 8.3 (g/dL)    Albumin 1.9 (*) 3.5 - 5.2 (g/dL)    AST 58 (*) 0 - 37 (U/L)    ALT 31  0 - 35 (U/L)    Alkaline Phosphatase 284 (*) 39 - 117 (U/L)    Total Bilirubin 0.2 (*) 0.3 - 1.2 (mg/dL)    GFR calc non Af Amer 20 (*) >90 (mL/min)    GFR calc Af Amer 24 (*) >90 (mL/min)   CBC     Status: Abnormal   Collection Time   11/12/11  6:00 AM      Component Value Range Comment   WBC 4.7  4.0 - 10.5 (K/uL)    RBC 3.38 (*) 3.87 - 5.11 (MIL/uL)    Hemoglobin 9.5 (*) 12.0 - 15.0 (g/dL)    HCT 28.8 (*) 36.0 - 46.0 (%)    MCV 85.2  78.0 - 100.0 (fL)    MCH 28.1  26.0 - 34.0 (pg)    MCHC 33.0  30.0 - 36.0 (g/dL)    RDW 16.6 (*) 11.5 - 15.5 (%)    Platelets 290  150 - 400 (K/uL)   GLUCOSE, CAPILLARY     Status: Abnormal   Collection Time   11/12/11  8:32 AM      Component Value Range Comment   Glucose-Capillary 111 (*) 70 - 99 (mg/dL)    Comment 1 Notify RN     CARDIAC PANEL(CRET KIN+CKTOT+MB+TROPI)     Status: Normal   Collection Time   11/12/11  8:40 AM      Component Value Range Comment   Total CK 38  7 - 177 (U/L)    CK, MB 1.4  0.3 - 4.0 (ng/mL)    Troponin I  <0.30  <0.30 (ng/mL)    Relative Index RELATIVE INDEX IS INVALID  0.0 - 2.5    GLUCOSE, CAPILLARY     Status: Abnormal   Collection Time   11/12/11 12:00 PM      Component Value Range Comment   Glucose-Capillary 122 (*) 70 - 99 (mg/dL)    Comment 1 Notify RN         Component Value Date/Time   SDES BLOOD DRAWN BY RN PICC LINE 11/07/2011 0637   SPECREQUEST BOTTLES DRAWN AEROBIC AND ANAEROBIC [facility] EACH BOTTLE 11/07/2011 0637   CULT NO GROWTH 5 DAYS 11/07/2011 1224   REPTSTATUS 11/12/2011 FINAL 11/07/2011 8250   Dg Chest 2 View  11/11/2011  *RADIOLOGY REPORT*  Clinical Data: 45 year old female with shortness of breath, weakness, fever of unknown origin.  CHEST - 2 VIEW  Comparison: Chest CT 11/06/2011 and earlier.  Findings: AP and lateral views of the chest demonstrate coarse perihilar pulmonary opacity extending to both lung bases.  This appears beyond that expected for atelectasis.  Right side PICC line.  Cardiomegaly. Visualized tracheal air column is within normal limits.  No pneumothorax or pulmonary edema evident.  IMPRESSION: Streaky perihilar and bibasilar opacity beyond that expected for  atelectasis.  Bilateral pneumonia suspected.  Original Report Authenticated By: Randall An, M.D.   US Abdomen Complete  11/10/2011  *RADIOLOGY REPORT*  Clinical Data:  Elevated liver function tests.  Nausea vomiting. Diabetes.  COMPLETE ABDOMINAL ULTRASOUND  Comparison:  CT scan dated 11/06/2011  Findings:  Gallbladder:  No gallstones, gallbladder wall thickening, or pericholecystic fluid.Negative sonographic Murphy's sign.  Common bile duct:  Normal.  4.9 mm in diameter.  Liver:  Hepatomegaly with diffuse increased echogenicity consistent with hepatic steatosis.  No focal lesions.  No biliary ductal dilatation.  IVC:  Normal.  Pancreas:  Normal.  Spleen:  Normal.  9.9 cm in length.  Right Kidney:  15.6 cm in length.  Increased echogenicity of the renal parenchyma suggesting renal medical disease.  No  hydronephrosis.  Left Kidney:  11.8 cm in length.  No hydronephrosis or mass. Detailed evaluation of the left kidney is suboptimal.  Abdominal aorta:  Maximal diameter 2.4 cm.  IMPRESSION: Hepatomegaly with hepatic steatosis. Slight increased echogenicity of the right renal parenchyma suggestive of renal medical disease. The left renal parenchyma is not well seen on this ultrasound but the kidney was not atrophic on the CT scan of 11/06/2011.  Original Report Authenticated By: Larey Seat, M.D.     Recent Results (from the past 720 hour(s))  CULTURE, BLOOD (ROUTINE X 2)     Status: Normal   Collection Time   10/26/11  6:31 PM      Component Value Range Status Comment   Specimen Description RIGHT ANTECUBITAL   Final    Special Requests BOTTLES DRAWN AEROBIC AND ANAEROBIC 6CC   Final    Culture NO GROWTH 5 DAYS   Final    Report Status 10/31/2011 FINAL   Final   CULTURE, BLOOD (ROUTINE X 2)     Status: Normal   Collection Time   10/26/11  6:41 PM      Component Value Range Status Comment   Specimen Description BLOOD RIGHT WRIST DRAWN BY RN   Final    Special Requests BOTTLES DRAWN AEROBIC ONLY 5CC   Final    Culture NO GROWTH 5 DAYS   Final    Report Status 10/31/2011 FINAL   Final   URINE CULTURE     Status: Normal   Collection Time   11/05/11  4:13 PM      Component Value Range Status Comment   Specimen Description URINE, CLEAN CATCH   Final    Special Requests NONE   Final    Setup Time [id number]   Final    Colony Count 8,000 COLONIES/ML   Final    Culture INSIGNIFICANT GROWTH   Final    Report Status 11/07/2011 FINAL   Final   CULTURE, BLOOD (ROUTINE X 2)     Status: Normal   Collection Time   11/06/11 10:40 AM      Component Value Range Status Comment   Specimen Description BLOOD PICC LINE DRAWN BY RN   Final    Special Requests BOTTLES DRAWN AEROBIC AND ANAEROBIC [place]   Final    Culture NO GROWTH 5 DAYS   Final    Report Status 11/11/2011 FINAL   Final   CULTURE, BLOOD  (ROUTINE X 2)     Status: Normal   Collection Time   11/06/11 11:19 AM      Component Value Range Status Comment   Specimen Description BLOOD PICC LINE DRAWN BY RN   Final    Special Requests  BOTTLES DRAWN AEROBIC AND ANAEROBIC W[place]   Final    Culture NO GROWTH 5 DAYS   Final    Report Status 11/11/2011 FINAL   Final   CLOSTRIDIUM DIFFICILE BY PCR     Status: Normal   Collection Time   11/07/11  5:43 AM      Component Value Range Status Comment   C difficile by pcr NEGATIVE  NEGATIVE  Final   CULTURE, BLOOD (ROUTINE X 2)     Status: Normal   Collection Time   11/07/11  6:37 AM      Component Value Range Status Comment   Specimen Description BLOOD DRAWN BY RN PICC LINE   Final    Special Requests     Final    Value: BOTTLES DRAWN AEROBIC AND ANAEROBIC 6CC EACH BOTTLE   Culture NO GROWTH 5 DAYS   Final    Report Status 11/12/2011 FINAL   Final   MRSA PCR SCREENING     Status: Normal   Collection Time   11/11/11  2:39 PM      Component Value Range Status Comment   MRSA by PCR NEGATIVE  NEGATIVE  Final    Impression/Recommendation  FUO: Certainly the patient had a known source of infection previously with her cellulitis involving her lymphedematous left lower extremity. However this seems to resolve. Her current fevers are without a clear-cut cause.  certainly is within the realm of possibility that she could have a soft tissue infection deeper in her leg although on exam there really is not much tenderness. One could contemplate imaging but an MRI is not be doable this point in time in a house and a CT with contrast is not possible due to her renal insufficiency. I do wonder about her pelvic lymphadenopathy. Herpes encephalitis or varicella-zoster encephalitis are possible although unlikely. The patient's HIV antibody was negative taking several items out of the differential.   -I do agree with getting a lumbar puncture and sending a cell count and differential protein and glucose  culture as well as herpes simplex PCR varicella-zoster PCR. If there is sufficient CSF but also consider sending a [place] Nile virus antibody as well as an enterovirus by PCR. --I would see if they can be arranged to send the patient over to [facility] for an open MRI of the brain. The yeiled on  herpes simplex PCR or VZV on spinal fluid that is already been fused with acyclovir not very great. MRI ofbrain also could help exclde brain abscess --Will check a serum RPR --Will check a HIV RNA --We'll check viral hepatitis panel --I will check an LDH CPK and ANA ferritin,  cryoglobulins sedimentation rate C-reactive protein, cytomegalovirus Ebstein Barr virus serologies --We'll check Helicobacter pylori antibodies --We could consider trying to image her legs potentially with an MRI at an outside facility such as [facility] or [facility]  Confusion/Encephalopathy:  --could be directly due to the fever itself, viral en cephalitis as possible   Thank you so much for this interesting consult,   Alcide Evener 11/12/2011, 3:22 PM   Patient does not need droplet precautions, She does not have untreated meningococcus

## 2011-11-12 NOTE — Progress Notes (Signed)
Subjective: S: Appears somewhat dehydrated, complaining of headache but alert and oriented and able to follow commands.  Objective: Weight change:   Intake/Output Summary (Last 24 hours) at 11/12/11 1358 Last data filed at 11/12/11 1015  Gross per 24 hour  Intake 1539.5 ml  Output    850 ml  Net  689.5 ml   Blood pressure 171/90, pulse 100, temperature 99.4 F (37.4 C), temperature source Oral, resp. rate 27, height _0  (1.651 m), weight 176 kg (388 lb 0.2 oz), SpO2 98.00%.  Physical Exam: General: Alert and awake, oriented x3, not in any acute distress. HEENT: anicteric sclera, pupils reactive to light and accommodation, EOMI CVS: S1-S2 clear, no murmur rubs or gallops Chest: clear to auscultation bilaterally, no wheezing, rales or rhonchi Abdomen: soft nontender, nondistended, normal bowel sounds, no organomegaly Extremities: no cyanosis, clubbing or edema noted bilaterally Neuro: Cranial nerves II-XII intact, no focal neurological deficits  Lab Results: Basic Metabolic Panel:  Lab 31/49/70 0600 11/11/11 1600  NA 135 134*  K 4.9 5.2*  CL 104 104  CO2 21 20  GLUCOSE 113* 92  BUN 27* 27*  CREATININE 2.67* 2.58*  CALCIUM 8.1* 7.9*  MG -- 1.8  PHOS -- 3.9   Liver Function Tests:  Lab 11/12/11 0600 11/11/11 0620  AST 58* 49*  ALT 31 33  ALKPHOS 284* 304*  BILITOT 0.2* 0.1*  PROT 5.9* 5.7*  ALBUMIN 1.9* 1.8*    Lab 11/09/11 0512  LIPASE --  AMYLASE 102    Lab 11/11/11 2107  AMMONIA 47   CBC:  Lab 11/12/11 0600 11/11/11 1600  WBC 4.7 4.6  NEUTROABS -- 3.3  HGB 9.5* 9.5*  HCT 28.8* 28.8*  MCV 85.2 85.2  PLT 290 303   Cardiac Enzymes:  Lab 11/12/11 0840 11/12/11 0230 11/11/11 1600  CKTOTAL 38 42 34  CKMB 1.4 1.4 1.3  CKMBINDEX -- -- --  TROPONINI <0.30 <0.30 <0.30   BNP:  Lab 11/11/11 1600  POCBNP 497.9*   CBG:  Lab 11/12/11 1200 11/12/11 0832 11/11/11 2254 11/11/11 1714 11/11/11 0740  GLUCAP 122* 111* 93 91 94     Micro  Results: Recent Results (from the past 240 hour(s))  URINE CULTURE     Status: Normal   Collection Time   11/05/11  4:13 PM      Component Value Range Status Comment   Specimen Description URINE, CLEAN CATCH   Final    Special Requests NONE   Final    Setup Time [id number]   Final    Colony Count 8,000 COLONIES/ML   Final    Culture INSIGNIFICANT GROWTH   Final    Report Status 11/07/2011 FINAL   Final   CULTURE, BLOOD (ROUTINE X 2)     Status: Normal   Collection Time   11/06/11 10:40 AM      Component Value Range Status Comment   Specimen Description BLOOD PICC LINE DRAWN BY RN   Final    Special Requests BOTTLES DRAWN AEROBIC AND ANAEROBIC [place]   Final    Culture NO GROWTH 5 DAYS   Final    Report Status 11/11/2011 FINAL   Final   CULTURE, BLOOD (ROUTINE X 2)     Status: Normal   Collection Time   11/06/11 11:19 AM      Component Value Range Status Comment   Specimen Description BLOOD PICC LINE DRAWN BY RN   Final    Special Requests BOTTLES DRAWN AEROBIC AND ANAEROBIC 6CC  Final    Culture NO GROWTH 5 DAYS   Final    Report Status 11/11/2011 FINAL   Final   CLOSTRIDIUM DIFFICILE BY PCR     Status: Normal   Collection Time   11/07/11  5:43 AM      Component Value Range Status Comment   C difficile by pcr NEGATIVE  NEGATIVE  Final   CULTURE, BLOOD (ROUTINE X 2)     Status: Normal   Collection Time   11/07/11  6:37 AM      Component Value Range Status Comment   Specimen Description BLOOD DRAWN BY RN PICC LINE   Final    Special Requests     Final    Value: BOTTLES DRAWN AEROBIC AND ANAEROBIC 6CC EACH BOTTLE   Culture NO GROWTH 5 DAYS   Final    Report Status 11/12/2011 FINAL   Final   MRSA PCR SCREENING     Status: Normal   Collection Time   11/11/11  2:39 PM      Component Value Range Status Comment   MRSA by PCR NEGATIVE  NEGATIVE  Final     Studies/Results: Ct Abdomen Pelvis Wo Contrast  11/06/2011  *RADIOLOGY REPORT*  Clinical Data:  Fever of unknown origin,  nausea/vomiting  CT CHEST, ABDOMEN AND PELVIS WITHOUT CONTRAST  Technique:  Multidetector CT imaging of the chest, abdomen and pelvis was performed following the standard protocol without IV contrast.  Comparison:  Chest radiographs dated 11/04/2011  CT CHEST  Findings:  Motion degraded images.  Mild dependent bilateral lower lobe opacities, likely atelectasis. No pleural effusion or pneumothorax.  Status post right thyroidectomy.  Left thyroid is unremarkable.  Mild cardiomegaly.  No pericardial effusion.  Small prevascular lymph nodes.  No suspicious axillary lymphadenopathy.  Right subclavian PICC terminating in the lower SVC.  Mild degenerative changes of the thoracic spine.  IMPRESSION: Motion degraded images.  Suspected bilateral lower lobe atelectasis.  Mild cardiomegaly.  CT ABDOMEN AND PELVIS  Findings:  Evaluation is constrained by artifact related to body habitus and lack of intravenous contrast.  Liver, spleen, pancreas, and adrenal glands are grossly unremarkable.  Gallbladder is grossly unremarkable.  No definite inflammatory changes by CT.  No.  Kidneys are unremarkable.  No renal calculi or hydronephrosis.  No evidence of bowel obstruction.  Colonic diverticulosis, without associated inflammatory changes.  No evidence of abdominal aortic aneurysm.  Retroperitoneal/left pelvic lymphadenopathy, including:  --2.5 x 1.9 cm left para-aortic node (series 2/image 65) --2.4 x 2.0 cm left common iliac node (series 2/image 85) --3.2 x 1.6 cm left external iliac node (series 2/image 107)  No abdominopelvic ascites.  No evidence of intra-abdominal abscess.  Fat-containing umbilical hernia (series 2/image 95).  Status post hysterectomy.  No adnexal masses.  Bladder is unremarkable.  Soft tissue stranding in the left lateral abdominal wall (series 2/image 82), incompletely visualized.  Visualized osseous structures are within normal limits.  IMPRESSION: Evaluation is constrained by artifact related to body habitus  and lack of intravenous contrast.  No evidence of intra-abdominal abscess.  Retroperitoneal/left pelvic lymphadenopathy, as described above.  Soft tissue stranding in the left lateral abdominal wall, incompletely visualized.  Original Report Authenticated By: Julian Hy, M.D.   Dg Chest 2 View  11/11/2011  *RADIOLOGY REPORT*  Clinical Data: 45 year old female with shortness of breath, weakness, fever of unknown origin.  CHEST - 2 VIEW  Comparison: Chest CT 11/06/2011 and earlier.  Findings: AP and lateral views of the chest demonstrate coarse  perihilar pulmonary opacity extending to both lung bases.  This appears beyond that expected for atelectasis.  Right side PICC line.  Cardiomegaly. Visualized tracheal air column is within normal limits.  No pneumothorax or pulmonary edema evident.  IMPRESSION: Streaky perihilar and bibasilar opacity beyond that expected for atelectasis.  Bilateral pneumonia suspected.  Original Report Authenticated By: Randall An, M.D.   Dg Chest 2 View  10/28/2011  *RADIOLOGY REPORT*  Clinical Data: PICC line placement  CHEST - 2 VIEW  Comparison: 10/28/2011  Findings: Left arm PICC line again identified, extending right lateral to mediastinum on PA view. On the lateral view, this appears to be anteriorly directed, likely within a mediastinal draining vein. This should be repositioned. Cardiac silhouette appears enlarged. Mediastinal contours and pulmonary vascularity grossly normal. Linear subsegmental atelectasis or scarring left lower lobe. Lungs otherwise clear.  IMPRESSION: Left arm PICC line tip appears external to the mediastinum, suspect within and mediastinal draining vein. Recommend repositioning or replacement.  Original Report Authenticated By: Burnetta Sabin, M.D.   Ct Chest Wo Contrast  11/06/2011  *RADIOLOGY REPORT*  Clinical Data:  Fever of unknown origin, nausea/vomiting  CT CHEST, ABDOMEN AND PELVIS WITHOUT CONTRAST  Technique:  Multidetector CT imaging  of the chest, abdomen and pelvis was performed following the standard protocol without IV contrast.  Comparison:  Chest radiographs dated 11/04/2011  CT CHEST  Findings:  Motion degraded images.  Mild dependent bilateral lower lobe opacities, likely atelectasis. No pleural effusion or pneumothorax.  Status post right thyroidectomy.  Left thyroid is unremarkable.  Mild cardiomegaly.  No pericardial effusion.  Small prevascular lymph nodes.  No suspicious axillary lymphadenopathy.  Right subclavian PICC terminating in the lower SVC.  Mild degenerative changes of the thoracic spine.  IMPRESSION: Motion degraded images.  Suspected bilateral lower lobe atelectasis.  Mild cardiomegaly.  CT ABDOMEN AND PELVIS  Findings:  Evaluation is constrained by artifact related to body habitus and lack of intravenous contrast.  Liver, spleen, pancreas, and adrenal glands are grossly unremarkable.  Gallbladder is grossly unremarkable.  No definite inflammatory changes by CT.  No.  Kidneys are unremarkable.  No renal calculi or hydronephrosis.  No evidence of bowel obstruction.  Colonic diverticulosis, without associated inflammatory changes.  No evidence of abdominal aortic aneurysm.  Retroperitoneal/left pelvic lymphadenopathy, including:  --2.5 x 1.9 cm left para-aortic node (series 2/image 65) --2.4 x 2.0 cm left common iliac node (series 2/image 85) --3.2 x 1.6 cm left external iliac node (series 2/image 107)  No abdominopelvic ascites.  No evidence of intra-abdominal abscess.  Fat-containing umbilical hernia (series 2/image 95).  Status post hysterectomy.  No adnexal masses.  Bladder is unremarkable.  Soft tissue stranding in the left lateral abdominal wall (series 2/image 82), incompletely visualized.  Visualized osseous structures are within normal limits.  IMPRESSION: Evaluation is constrained by artifact related to body habitus and lack of intravenous contrast.  No evidence of intra-abdominal abscess.  Retroperitoneal/left  pelvic lymphadenopathy, as described above.  Soft tissue stranding in the left lateral abdominal wall, incompletely visualized.  Original Report Authenticated By: Julian Hy, M.D.   US Abdomen Complete  11/10/2011  *RADIOLOGY REPORT*  Clinical Data:  Elevated liver function tests.  Nausea vomiting. Diabetes.  COMPLETE ABDOMINAL ULTRASOUND  Comparison:  CT scan dated 11/06/2011  Findings:  Gallbladder:  No gallstones, gallbladder wall thickening, or pericholecystic fluid.Negative sonographic Murphy's sign.  Common bile duct:  Normal.  4.9 mm in diameter.  Liver:  Hepatomegaly with diffuse increased echogenicity consistent  with hepatic steatosis.  No focal lesions.  No biliary ductal dilatation.  IVC:  Normal.  Pancreas:  Normal.  Spleen:  Normal.  9.9 cm in length.  Right Kidney:  15.6 cm in length.  Increased echogenicity of the renal parenchyma suggesting renal medical disease.  No hydronephrosis.  Left Kidney:  11.8 cm in length.  No hydronephrosis or mass. Detailed evaluation of the left kidney is suboptimal.  Abdominal aorta:  Maximal diameter 2.4 cm.  IMPRESSION: Hepatomegaly with hepatic steatosis. Slight increased echogenicity of the right renal parenchyma suggestive of renal medical disease. The left renal parenchyma is not well seen on this ultrasound but the kidney was not atrophic on the CT scan of 11/06/2011.  Original Report Authenticated By: Larey Seat, M.D.   Ir US Guide Vasc Access Right  10/29/2011  *RADIOLOGY REPORT*  Clinical Data: Cellulitis and needs IV access.  PICC LINE PLACEMENT WITH ULTRASOUND AND FLUOROSCOPIC  GUIDANCE  Fluoroscopy Time: 0.3 minutes.  The right arm was prepped with chlorhexidine, draped in the usual sterile fashion using maximum barrier technique (cap and mask, sterile gown, sterile gloves, large sterile sheet, hand hygiene and cutaneous antisepsis) and infiltrated locally with 1% Lidocaine.  Ultrasound demonstrated patency of the right basilic vein, and  this was documented with an image.  Under real-time ultrasound guidance, this vein was accessed with a 21 gauge micropuncture needle and image documentation was performed.  The needle was exchanged over a guidewire for a peel-away sheath through which a five [place] dual lumen PICC trimmed to 41 cm was advanced, positioned with its tip at the lower SVC.  Fluoroscopy during the procedure and fluoro spot radiograph confirms appropriate catheter position.  The catheter was flushed, secured to the skin with Prolene sutures, and covered with a sterile dressing.  Complications:  None  IMPRESSION: Successful right arm PICC line placement with ultrasound and fluoroscopic guidance.  The catheter is ready for use.  Original Report Authenticated By: Markus Daft, M.D.   Dg Chest Portable 1 View  11/04/2011  *RADIOLOGY REPORT*  Clinical Data: Cough, shortness of breath, fever  PORTABLE CHEST - 1 VIEW  Comparison: Chest x-ray of 10/28/2011  Findings: A right upper extremity PICC line is present with the tip seen to the lower SVC.  No pneumothorax is noted.  Cardiomegaly is stable.  No bony abnormality is seen other than degenerative change throughout the thoracic spine.  IMPRESSION: Right PICC line tip in lower SVC.  No pneumothorax.  Original Report Authenticated By: Joretta Bachelor, M.D.   Dg Chest Portable 1 View  10/28/2011  *RADIOLOGY REPORT*  Clinical Data: Left PICC line placement.  PORTABLE CHEST - 1 VIEW  Comparison: 10/26/2011  Findings: Left PICC line is in place.  The tip projects over the medial right lung and appears to be beyond the SVC margin.  I cannot exclude that this is extravascular.  Mild cardiomegaly.  Bibasilar atelectasis.  No effusions.  IMPRESSION: Left PICC line tip projects in beyond the SVC margin over the medial right lung.  Cannot exclude this is extravascular.  Consider replacement.  Original Report Authenticated By: Raelyn Number, M.D.   Dg Chest Portable 1 View  10/26/2011  *RADIOLOGY  REPORT*  Clinical Data: Fever.  Cough.  PORTABLE CHEST - 1 VIEW  Comparison: 05/27/2010.  Findings: Cardiomegaly.  Central pulmonary vascular prominence.  No segmental infiltrate.  Limited evaluation of the mediastinum by technique.  The patient would eventually benefit from follow-up two-view chest with cardiac leads  removed.  No gross pneumothorax.  IMPRESSION: Cardiomegaly with central pulmonary vascular prominence.  Please see above.  Original Report Authenticated By: Doug Sou, M.D.   Ir Central Line Left  10/29/2011  *RADIOLOGY REPORT*  Clinical Data: Cellulitis and needs IV access.  PICC LINE PLACEMENT WITH ULTRASOUND AND FLUOROSCOPIC  GUIDANCE  Fluoroscopy Time: 0.3 minutes.  The right arm was prepped with chlorhexidine, draped in the usual sterile fashion using maximum barrier technique (cap and mask, sterile gown, sterile gloves, large sterile sheet, hand hygiene and cutaneous antisepsis) and infiltrated locally with 1% Lidocaine.  Ultrasound demonstrated patency of the right basilic vein, and this was documented with an image.  Under real-time ultrasound guidance, this vein was accessed with a 21 gauge micropuncture needle and image documentation was performed.  The needle was exchanged over a guidewire for a peel-away sheath through which a five [place] dual lumen PICC trimmed to 41 cm was advanced, positioned with its tip at the lower SVC.  Fluoroscopy during the procedure and fluoro spot radiograph confirms appropriate catheter position.  The catheter was flushed, secured to the skin with Prolene sutures, and covered with a sterile dressing.  Complications:  None  IMPRESSION: Successful right arm PICC line placement with ultrasound and fluoroscopic guidance.  The catheter is ready for use.  Original Report Authenticated By: Markus Daft, M.D.    Medications: Scheduled Meds:   . acyclovir  10 mg/kg (Ideal) Intravenous Q8H  . cefTAZidime (FORTAZ) IV  1 g Intravenous Q8H  . ciprofloxacin  400  mg Intravenous Q12H  . enoxaparin  80 mg Subcutaneous Q24H  . feeding supplement  237 mL Oral TID WC  . feeding supplement  1 Container Oral BID BM  . Flora-Q  1 capsule Oral Daily  . fluconazole  100 mg Oral Daily  . insulin aspart  0-20 Units Subcutaneous TID WC  . insulin aspart  0-5 Units Subcutaneous QHS  . insulin aspart  5 Units Subcutaneous TID WC  . insulin glargine  20 Units Subcutaneous Daily  . metoCLOPramide (REGLAN) injection  5 mg Intravenous TID WC & HS  . metoprolol  5 mg Intravenous Q6H  . ondansetron  4 mg Intravenous QID  . pantoprazole (PROTONIX) IV  40 mg Intravenous Q12H  . DISCONTD: acyclovir  10 mg/kg Intravenous Q8H  . DISCONTD: ciprofloxacin  400 mg Intravenous Q12H  . DISCONTD: enoxaparin  30 mg Subcutaneous Q24H  . DISCONTD: imipenem-cilastatin  500 mg Intravenous Q8H  . DISCONTD: linezolid  600 mg Intravenous Q12H  . DISCONTD: sodium chloride  10 mL Intracatheter Q12H   Continuous Infusions:   . sodium chloride 125 mL/hr at 11/11/11 1800  . DISCONTD: sodium chloride 1,000 mL (11/11/11 0343)   PRN Meds:.acetaminophen, acetaminophen, hydrALAZINE, oxyCODONE-acetaminophen, phenol, promethazine, senna-docusate, zolpidem, DISCONTD: ondansetron (ZOFRAN) IV, DISCONTD: ondansetron, DISCONTD: sodium chloride  Assessment/Plan: Principal Problem:  *Altered mental state: Unclear etiology at this time, possible multifactorial in etiology with differential including persistent fever, encephalopathy/meningitis (acute bacterial versus viral infection), HIV  - Discussed in detail with neurology, recommending CT scan of the head, EEG, lumbar puncture  - Infectious disease consultation has been obtained will await recommendations   LE cellulitis: Appears to be resolved, patient has significant lymphedema and morbid capacity   Fever of unknown origin  - Unclear etiology, please see principal problem for further evaluation and management, continue FUO workup  - Will also  order blood work for vasculitis work up   Acute renal failure/dehydration  - vasculitis work  up, ANA, ANCA, complement levels, CRP, ESR, UA and Urine Cx in progress - continue IVF,  renal ultrasound was done on 11/10/2011 which showed hepatomegaly with hepatic steatosis, slight increased echogenicity of the right renal parenchyma suggestive of renal medical disease. - Will discuss with renal service for formal recommendations if creatinine function worsening.    Anemia  - FOBT positive, pt seen by GI at [facility] and they have started her on Reglan for nausea  - nausea and vomiting was thought to be secondary to gastroparesis, gastric emptying study pending   Diabetes mellitus : A1c pending - uncontrolled, Continue sliding scale insulin and Lantus   Morbid obesity: BMI over 40   Transaminitis  - Unclear etiology, vasculitis workup in progress  - Obtain hepatitis panel and CMET in the morning   Lymphedema of lower extremity  - severe and chronic, per pt's report now at her baseline   HTN (hypertension)  - continue same medication regimen   HLD (hyperlipidemia)  - chronic, no statins secondary to tramsamnitis   Patient is full code  Disposition: Follow in step-down unit for next 24 hours for close monitoring, if hemodynamically stable can be transferred out to telemetry monitored floor tomorrow     LOS: 8 days   Jasie Meleski 11/12/2011, 1:58 PM

## 2011-11-12 NOTE — Progress Notes (Signed)
Subjective: Patient has no complaints other than felling weak all over.  she is alert oriented and able to follow all my commands. MRI was ordered, however patient is >300lbs and unable to obtain images.  She has flouro guided LP ordered for today   Objective: Vital signs in last 24 hours: Temp:  [99 F (37.2 C)-102.4 F (39.1 C)] 99 F (37.2 C) (11/23 0800) Pulse Rate:  [75-126] 84  (11/23 0700) Resp:  [14-29] 28  (11/23 0700) BP: (154-179)/(75-122) 167/89 mmHg (11/23 0700) SpO2:  [91 %-100 %] 100 % (11/23 0700) Weight:  [176 kg (388 lb 0.2 oz)] 388 lb 0.2 oz (176 kg) (11/23 0126)  Intake/Output from previous day: 11/22 0701 - 11/23 0700 In: 1539.5 [I.V.:1075; IV Piggyback:464.5] Out: 901 [Urine:900; Stool:1] Intake/Output this shift:   Nutritional status: Full Liquid  Past Medical History  Diagnosis Date  . Diabetes mellitus   . Chronic pain   . Lymph edema   . Cellulitis   . Chiari malformation     s/p surgery  . S/P colonoscopy 05/26/2007    Dr. Laural Golden sigmoid diverticulosis random biopsies benign  . CAD (coronary artery disease)   . Hypercholesterolemia   . Hypertension   . Asthma   . S/P endoscopy 05/01/2009    Dr. Penelope Coop pill-induced esophageal ulcerations distal to midesophagus, 2 small ulcers in the antrum of the stomach    Neurologic Exam: Mental Status: Alert, oriented, thought content appropriate.  Speech fluent without evidence of aphasia. Able to follow 3 step commands without difficulty. Cranial Nerves: II- Visual fields grossly intact. III/IV/VI-Extraocular movements intact.  Pupils reactive bilaterally. V/VII-Smile symmetric VIII-grossly intact IX/X-normal gag XI-bilateral shoulder shrug XII-midline tongue extension Motor: 4/5 bilaterally UE with normal tone and bulk.  When arms are held out in front of patient she has noticeable postural tremor right> left arm.  She can lift legs off bed but has a hard time keeping them antigravity. She has  significant lymphedema  edema of LE . Sensory: Pinprick and light touch intact throughout, bilaterally Deep Tendon Reflexes: 2+ and symmetric  Plantars: mute Cerebellar: Normal finger-to-nose,    Lab Results:  Basename 11/12/11 0600 11/11/11 1600  WBC 4.7 4.6  HGB 9.5* 9.5*  HCT 28.8* 28.8*  PLT 290 303  NA 135 134*  K 4.9 5.2*  CL 104 104  CO2 21 20  GLUCOSE 113* 92  BUN 27* 27*  CREATININE 2.67* 2.58*  CALCIUM 8.1* 7.9*  LABA1C -- --   Lipid Panel No results found for this basename: CHOL,TRIG,HDL,CHOLHDL,VLDL,LDLCALC in the last 72 hours  Studies/Results: Dg Chest 2 View  11/11/2011  *RADIOLOGY REPORT*  Clinical Data: 45 year old female with shortness of breath, weakness, fever of unknown origin.  CHEST - 2 VIEW  Comparison: Chest CT 11/06/2011 and earlier.  Findings: AP and lateral views of the chest demonstrate coarse perihilar pulmonary opacity extending to both lung bases.  This appears beyond that expected for atelectasis.  Right side PICC line.  Cardiomegaly. Visualized tracheal air column is within normal limits.  No pneumothorax or pulmonary edema evident.  IMPRESSION: Streaky perihilar and bibasilar opacity beyond that expected for atelectasis.  Bilateral pneumonia suspected.  Original Report Authenticated By: Randall An, M.D.   US Abdomen Complete  11/10/2011  *RADIOLOGY REPORT*  Clinical Data:  Elevated liver function tests.  Nausea vomiting. Diabetes.  COMPLETE ABDOMINAL ULTRASOUND  Comparison:  CT scan dated 11/06/2011  Findings:  Gallbladder:  No gallstones, gallbladder wall thickening, or pericholecystic fluid.Negative sonographic Murphy's  sign.  Common bile duct:  Normal.  4.9 mm in diameter.  Liver:  Hepatomegaly with diffuse increased echogenicity consistent with hepatic steatosis.  No focal lesions.  No biliary ductal dilatation.  IVC:  Normal.  Pancreas:  Normal.  Spleen:  Normal.  9.9 cm in length.  Right Kidney:  15.6 cm in length.  Increased echogenicity  of the renal parenchyma suggesting renal medical disease.  No hydronephrosis.  Left Kidney:  11.8 cm in length.  No hydronephrosis or mass. Detailed evaluation of the left kidney is suboptimal.  Abdominal aorta:  Maximal diameter 2.4 cm.  IMPRESSION: Hepatomegaly with hepatic steatosis. Slight increased echogenicity of the right renal parenchyma suggestive of renal medical disease. The left renal parenchyma is not well seen on this ultrasound but the kidney was not atrophic on the CT scan of 11/06/2011.  Original Report Authenticated By: Larey Seat, M.D.    Medications:  Scheduled:   . acyclovir  10 mg/kg (Ideal) Intravenous Q8H  . cefTAZidime (FORTAZ) IV  1 g Intravenous Q8H  . ciprofloxacin  400 mg Intravenous Q12H  . enoxaparin  80 mg Subcutaneous Q24H  . feeding supplement  237 mL Oral TID WC  . feeding supplement  1 Container Oral BID BM  . Flora-Q  1 capsule Oral Daily  . fluconazole  100 mg Oral Daily  . insulin aspart  0-20 Units Subcutaneous TID WC  . insulin aspart  0-5 Units Subcutaneous QHS  . insulin aspart  5 Units Subcutaneous TID WC  . insulin glargine  20 Units Subcutaneous Daily  . metoCLOPramide (REGLAN) injection  5 mg Intravenous TID WC & HS  . metoprolol  5 mg Intravenous Q6H  . ondansetron  4 mg Intravenous QID  . pantoprazole (PROTONIX) IV  40 mg Intravenous Q12H  . DISCONTD: acyclovir  10 mg/kg Intravenous Q8H  . DISCONTD: ciprofloxacin  400 mg Intravenous Q12H  . DISCONTD: enoxaparin  30 mg Subcutaneous Q24H  . DISCONTD: imipenem-cilastatin  500 mg Intravenous Q8H  . DISCONTD: linezolid  600 mg Intravenous Q12H  . DISCONTD: sodium chloride  10 mL Intracatheter Q12H    Assessment/Plan: Ms. CHAMYA HUNTON is a 45 y.o. AA female with altered mental status and FUO. Has negative blood, urine and C.Diff cultures. Exam most consistent with toxic metabolic/infectious etiology.    LP to be done today Ammonia 47  Will continue to follow.   Etta Quill  PA-C [facility] [phone]  11/12/2011, 9:51 AM

## 2011-11-12 NOTE — Progress Notes (Signed)
Two RNs attempted to place foley catheter without success.

## 2011-11-13 ENCOUNTER — Inpatient Hospital Stay (HOSPITAL_COMMUNITY): Payer: PRIVATE HEALTH INSURANCE

## 2011-11-13 LAB — RHEUMATOID FACTOR: Rhuematoid fact SerPl-aCnc: 16 IU/mL — ABNORMAL HIGH (ref <=14)

## 2011-11-13 LAB — CSF CELL COUNT WITH DIFFERENTIAL
Eosinophils, CSF: 0 % (ref 0–1)
Lymphs, CSF: 0 % — ABNORMAL LOW (ref 40–80)
Tube #: 3

## 2011-11-13 LAB — SEDIMENTATION RATE: Sed Rate: 134 mm/hr — ABNORMAL HIGH (ref 0–22)

## 2011-11-13 LAB — GLUCOSE, CAPILLARY
Glucose-Capillary: 113 mg/dL — ABNORMAL HIGH (ref 70–99)
Glucose-Capillary: 121 mg/dL — ABNORMAL HIGH (ref 70–99)
Glucose-Capillary: 125 mg/dL — ABNORMAL HIGH (ref 70–99)

## 2011-11-13 LAB — PROCALCITONIN: Procalcitonin: 0.34 ng/mL

## 2011-11-13 LAB — LACTATE DEHYDROGENASE: LDH: 466 U/L — ABNORMAL HIGH (ref 94–250)

## 2011-11-13 LAB — PROTEIN AND GLUCOSE, CSF
Glucose, CSF: 68 mg/dL (ref 43–76)
Total  Protein, CSF: 18 mg/dL (ref 15–45)

## 2011-11-13 MED ORDER — METOPROLOL TARTRATE 12.5 MG HALF TABLET
12.5000 mg | ORAL_TABLET | Freq: Two times a day (BID) | ORAL | Status: DC
  Administered 2011-11-13 – 2011-11-15 (×6): 12.5 mg via ORAL
  Filled 2011-11-13 (×10): qty 1

## 2011-11-13 MED ORDER — ALTEPLASE 100 MG IV SOLR: 2.0000 mg | Freq: Once | INTRAVENOUS | Status: DC

## 2011-11-13 MED ORDER — DEXTROSE 5 % IV SOLN
10.0000 mg/kg | Freq: Two times a day (BID) | INTRAVENOUS | Status: DC
  Administered 2011-11-14 – 2011-11-16 (×7): 570 mg via INTRAVENOUS
  Filled 2011-11-13 (×10): qty 11.4

## 2011-11-13 MED ORDER — CLONIDINE HCL 0.2 MG PO TABS
0.2000 mg | ORAL_TABLET | Freq: Once | ORAL | Status: AC
  Administered 2011-11-13: 0.2 mg via ORAL
  Filled 2011-11-13 (×2): qty 1

## 2011-11-13 MED ORDER — HYDRALAZINE HCL 20 MG/ML IJ SOLN
10.0000 mg | Freq: Four times a day (QID) | INTRAMUSCULAR | Status: DC | PRN
  Administered 2011-11-13 – 2011-11-15 (×3): 10 mg via INTRAVENOUS
  Filled 2011-11-13 (×6): qty 0.5

## 2011-11-13 MED ORDER — SODIUM CHLORIDE 0.9 % IJ SOLN
10.0000 mL | INTRAMUSCULAR | Status: DC | PRN
  Administered 2011-11-14 – 2011-11-20 (×8): 10 mL

## 2011-11-13 MED ORDER — AMLODIPINE BESYLATE 10 MG PO TABS
10.0000 mg | ORAL_TABLET | Freq: Every day | ORAL | Status: DC
  Administered 2011-11-13 – 2011-11-22 (×10): 10 mg via ORAL
  Filled 2011-11-13 (×11): qty 1

## 2011-11-13 MED ORDER — IBUPROFEN 400 MG PO TABS
400.0000 mg | ORAL_TABLET | Freq: Four times a day (QID) | ORAL | Status: DC | PRN
  Administered 2011-11-13: 400 mg via ORAL
  Filled 2011-11-13: qty 1

## 2011-11-13 MED ORDER — ALTEPLASE 2 MG IJ SOLR
2.0000 mg | Freq: Once | INTRAMUSCULAR | Status: DC
  Filled 2011-11-13: qty 2

## 2011-11-13 MED ORDER — ALTEPLASE 100 MG IV SOLR
2.0000 mg | Freq: Once | INTRAVENOUS | Status: AC
  Administered 2011-11-13: 2 mg
  Filled 2011-11-13 (×2): qty 2

## 2011-11-13 NOTE — Consult Note (Addendum)
Reason for Consult:ARF  :Pt is 45 year old female with medical history outlined below who presents to [facility] transferred from [facility] for further evaluation and management of fever of unknown origin. The patient has been initially admitted to [facility] on November 5th for cellulitis treatment of left lower extremity. In addition records indicate the patient has received antibiotics prior to being admitted to [facility] in an outpatient setting but was unable to tolerate antibiotics secondary to abdominal pain and vomiting. During the course of hospitalization at [facility] she has received vancomycin and Rocephin and cellulitis has improved however patient continued to have persistent fever of up to [place].  Renal consultation requesting due to increased Screat was 1.101m/dl on Nov 15 and progressively risen to 2.67 mg/dl today. She has a past history of documented proteinuria and sCreat of 0.728mdl and >30097ml protein on u/a on October 04, 2010.  Ultrasound shows: Right Kidney: 15.6 cm in length. Increased echogenicity of the  renal parenchyma suggesting renal medical disease. No  hydronephrosis.  Left Kidney: 11.8 cm in length. No hydronephrosis or mass.  Detailed evaluation of the left kidney is suboptimal.     Past Medical History  Diagnosis Date  . Diabetes mellitus   . Chronic pain   . Lymph edema   . Cellulitis   . Chiari malformation     s/p surgery  . S/P colonoscopy 05/26/2007    Dr. RehLaural Goldengmoid diverticulosis random biopsies benign  . CAD (coronary artery disease)   . Hypercholesterolemia   . Hypertension   . Asthma   . S/P endoscopy 05/01/2009    Dr. GanPenelope Coopll-induced esophageal ulcerations distal to midesophagus, 2 small ulcers in the antrum of the stomach    Past Surgical History  Procedure Date  . Cesarean section      x 2  . Abdominal hysterectomy   . Tonsillectomy   . Adenoidectomy   . Thyroidectomy, partial   . Craniectomy suboccipital w/ cervical  laminectomy / chiari     Family History  Problem Relation Age of Onset  . Colon cancer Mother 50 63 Stroke Mother 52 25 Coronary artery disease Mother     Social History:  reports that she has never smoked. She does not have any smokeless tobacco history on file. She reports that she does not drink alcohol or use illicit drugs.  Allergies:  Allergies  Allergen Reactions  . Contrast Media (Iodinated Diagnostic Agents) Swelling    Dye for cardiac cath.  . Pneumococcal Vaccines     Medications:  Scheduled:   . acyclovir  10 mg/kg (Ideal) Intravenous Q12H  . alteplase  2 mg Intracatheter Once  . feeding supplement  237 mL Oral TID WC  . feeding supplement  1 Container Oral BID BM  . Flora-Q  1 capsule Oral Daily  . insulin aspart  0-20 Units Subcutaneous TID WC  . insulin aspart  0-5 Units Subcutaneous QHS  . insulin aspart  5 Units Subcutaneous TID WC  . insulin glargine  10 Units Subcutaneous Daily  . metoCLOPramide (REGLAN) injection  5 mg Intravenous TID WC & HS  . metoprolol  5 mg Intravenous Q6H  . ondansetron  4 mg Intravenous QID  . pantoprazole (PROTONIX) IV  40 mg Intravenous Q12H  . DISCONTD: acyclovir  10 mg/kg (Ideal) Intravenous Q8H  . DISCONTD: alteplase  2 mg Intracatheter Once  . DISCONTD: alteplase  2 mg Intracatheter Once  . DISCONTD: cefTAZidime (FORTAZ) IV  1 g Intravenous Q8H  .  DISCONTD: ciprofloxacin  400 mg Intravenous Q12H  . DISCONTD: enoxaparin  80 mg Subcutaneous Q24H  . DISCONTD: fluconazole  100 mg Oral Daily  . DISCONTD: insulin glargine  20 Units Subcutaneous Daily    Results for orders placed during the hospital encounter of 11/04/11 (from the past 48 hour(s))  MRSA PCR SCREENING     Status: Normal   Collection Time   11/11/11  2:39 PM      Component Value Range Comment   MRSA by PCR NEGATIVE  NEGATIVE    CBC     Status: Abnormal   Collection Time   11/11/11  4:00 PM      Component Value Range Comment   WBC 4.6  4.0 - 10.5 (K/uL)      RBC 3.38 (*) 3.87 - 5.11 (MIL/uL)    Hemoglobin 9.5 (*) 12.0 - 15.0 (g/dL)    HCT 28.8 (*) 36.0 - 46.0 (%)    MCV 85.2  78.0 - 100.0 (fL)    MCH 28.1  26.0 - 34.0 (pg)    MCHC 33.0  30.0 - 36.0 (g/dL)    RDW 16.7 (*) 11.5 - 15.5 (%)    Platelets 303  150 - 400 (K/uL)   MAGNESIUM     Status: Normal   Collection Time   11/11/11  4:00 PM      Component Value Range Comment   Magnesium 1.8  1.5 - 2.5 (mg/dL)   PHOSPHORUS     Status: Normal   Collection Time   11/11/11  4:00 PM      Component Value Range Comment   Phosphorus 3.9  2.3 - 4.6 (mg/dL)   DIFFERENTIAL     Status: Normal   Collection Time   11/11/11  4:00 PM      Component Value Range Comment   Neutrophils Relative 72  43 - 77 (%)    Neutro Abs 3.3  1.7 - 7.7 (K/uL)    Lymphocytes Relative 19  12 - 46 (%)    Lymphs Abs 0.9  0.7 - 4.0 (K/uL)    Monocytes Relative 7  3 - 12 (%)    Monocytes Absolute 0.3  0.1 - 1.0 (K/uL)    Eosinophils Relative 2  0 - 5 (%)    Eosinophils Absolute 0.1  0.0 - 0.7 (K/uL)    Basophils Relative 0  0 - 1 (%)    Basophils Absolute 0.0  0.0 - 0.1 (K/uL)   APTT     Status: Abnormal   Collection Time   11/11/11  4:00 PM      Component Value Range Comment   aPTT 42 (*) 24 - 37 (seconds)   PROTIME-INR     Status: Normal   Collection Time   11/11/11  4:00 PM      Component Value Range Comment   Prothrombin Time 14.2  11.6 - 15.2 (seconds)    INR 1.08  0.00 - 1.49    PRO B NATRIURETIC PEPTIDE     Status: Abnormal   Collection Time   11/11/11  4:00 PM      Component Value Range Comment   BNP, POC 497.9 (*) 0 - 125 (pg/mL)   CARDIAC PANEL(CRET KIN+CKTOT+MB+TROPI)     Status: Normal   Collection Time   11/11/11  4:00 PM      Component Value Range Comment   Total CK 34  7 - 177 (U/L)    CK, MB 1.3  0.3 - 4.0 (ng/mL)  Troponin I <0.30  <0.30 (ng/mL)    Relative Index RELATIVE INDEX IS INVALID  0.0 - 2.5    HEMOGLOBIN A1C     Status: Abnormal   Collection Time   11/11/11  4:00 PM       Component Value Range Comment   Hemoglobin A1C 10.2 (*) <5.7 (%)    Mean Plasma Glucose 246 (*) <117 (mg/dL)   BASIC METABOLIC PANEL     Status: Abnormal   Collection Time   11/11/11  4:00 PM      Component Value Range Comment   Sodium 134 (*) 135 - 145 (mEq/L)    Potassium 5.2 (*) 3.5 - 5.1 (mEq/L)    Chloride 104  96 - 112 (mEq/L)    CO2 20  19 - 32 (mEq/L)    Glucose, Bld 92  70 - 99 (mg/dL)    BUN 27 (*) 6 - 23 (mg/dL)    Creatinine, Ser 2.58 (*) 0.50 - 1.10 (mg/dL)    Calcium 7.9 (*) 8.4 - 10.5 (mg/dL)    GFR calc non Af Amer 21 (*) >90 (mL/min)    GFR calc Af Amer 25 (*) >90 (mL/min)   SEDIMENTATION RATE     Status: Abnormal   Collection Time   11/11/11  4:00 PM      Component Value Range Comment   Sed Rate 127 (*) 0 - 22 (mm/hr)   LACTATE DEHYDROGENASE     Status: Abnormal   Collection Time   11/11/11  4:00 PM      Component Value Range Comment   LD 422 (*) 94 - 250 (U/L)   VANCOMYCIN, TROUGH     Status: Normal   Collection Time   11/11/11  4:00 PM      Component Value Range Comment   Vancomycin Tr 11.1  10.0 - 20.0 (ug/mL)   GLUCOSE, CAPILLARY     Status: Normal   Collection Time   11/11/11  5:14 PM      Component Value Range Comment   Glucose-Capillary 91  70 - 99 (mg/dL)    Comment 1 Documented in Chart      Comment 2 Notify RN     AMMONIA     Status: Normal   Collection Time   11/11/11  9:07 PM      Component Value Range Comment   Ammonia 47  11 - 60 (umol/L)   PROCALCITONIN     Status: Normal   Collection Time   11/11/11  9:08 PM      Component Value Range Comment   Procalcitonin 0.27     ANA     Status: Normal   Collection Time   11/11/11  9:08 PM      Component Value Range Comment   ANA NEGATIVE  NEGATIVE    C3 COMPLEMENT     Status: Normal   Collection Time   11/11/11  9:08 PM      Component Value Range Comment   C3 Complement 145  90 - 180 (mg/dL)   C4 COMPLEMENT     Status: Normal   Collection Time   11/11/11  9:08 PM      Component Value  Range Comment   Complement C4, Body Fluid 40  10 - 40 (mg/dL)   C-REACTIVE PROTEIN     Status: Abnormal   Collection Time   11/11/11  9:08 PM      Component Value Range Comment   CRP 0.81 (*) <0.60 (mg/dL)  HIV ANTIBODY (ROUTINE TESTING)     Status: Normal   Collection Time   11/11/11  9:08 PM      Component Value Range Comment   HIV NON REACTIVE  NON REACTIVE    TSH     Status: Normal   Collection Time   11/11/11  9:08 PM      Component Value Range Comment   TSH 2.983  0.350 - 4.500 (uIU/mL)   GLUCOSE, CAPILLARY     Status: Normal   Collection Time   11/11/11 10:54 PM      Component Value Range Comment   Glucose-Capillary 93  70 - 99 (mg/dL)    Comment 1 Documented in Chart      Comment 2 Notify RN     CARDIAC PANEL(CRET KIN+CKTOT+MB+TROPI)     Status: Normal   Collection Time   11/12/11  2:30 AM      Component Value Range Comment   Total CK 42  7 - 177 (U/L)    CK, MB 1.4  0.3 - 4.0 (ng/mL)    Troponin I <0.30  <0.30 (ng/mL)    Relative Index RELATIVE INDEX IS INVALID  0.0 - 2.5    COMPREHENSIVE METABOLIC PANEL     Status: Abnormal   Collection Time   11/12/11  6:00 AM      Component Value Range Comment   Sodium 135  135 - 145 (mEq/L)    Potassium 4.9  3.5 - 5.1 (mEq/L)    Chloride 104  96 - 112 (mEq/L)    CO2 21  19 - 32 (mEq/L)    Glucose, Bld 113 (*) 70 - 99 (mg/dL)    BUN 27 (*) 6 - 23 (mg/dL)    Creatinine, Ser 2.67 (*) 0.50 - 1.10 (mg/dL)    Calcium 8.1 (*) 8.4 - 10.5 (mg/dL)    Total Protein 5.9 (*) 6.0 - 8.3 (g/dL)    Albumin 1.9 (*) 3.5 - 5.2 (g/dL)    AST 58 (*) 0 - 37 (U/L)    ALT 31  0 - 35 (U/L)    Alkaline Phosphatase 284 (*) 39 - 117 (U/L)    Total Bilirubin 0.2 (*) 0.3 - 1.2 (mg/dL)    GFR calc non Af Amer 20 (*) >90 (mL/min)    GFR calc Af Amer 24 (*) >90 (mL/min)   CBC     Status: Abnormal   Collection Time   11/12/11  6:00 AM      Component Value Range Comment   WBC 4.7  4.0 - 10.5 (K/uL)    RBC 3.38 (*) 3.87 - 5.11 (MIL/uL)     Hemoglobin 9.5 (*) 12.0 - 15.0 (g/dL)    HCT 28.8 (*) 36.0 - 46.0 (%)    MCV 85.2  78.0 - 100.0 (fL)    MCH 28.1  26.0 - 34.0 (pg)    MCHC 33.0  30.0 - 36.0 (g/dL)    RDW 16.6 (*) 11.5 - 15.5 (%)    Platelets 290  150 - 400 (K/uL)   GLUCOSE, CAPILLARY     Status: Abnormal   Collection Time   11/12/11  8:32 AM      Component Value Range Comment   Glucose-Capillary 111 (*) 70 - 99 (mg/dL)    Comment 1 Notify RN     CARDIAC PANEL(CRET KIN+CKTOT+MB+TROPI)     Status: Normal   Collection Time   11/12/11  8:40 AM      Component Value Range Comment   Total  CK 38  7 - 177 (U/L)    CK, MB 1.4  0.3 - 4.0 (ng/mL)    Troponin I <0.30  <0.30 (ng/mL)    Relative Index RELATIVE INDEX IS INVALID  0.0 - 2.5    GLUCOSE, CAPILLARY     Status: Abnormal   Collection Time   11/12/11 12:00 PM      Component Value Range Comment   Glucose-Capillary 122 (*) 70 - 99 (mg/dL)    Comment 1 Notify RN     GLUCOSE, CAPILLARY     Status: Abnormal   Collection Time   11/12/11 10:20 PM      Component Value Range Comment   Glucose-Capillary 140 (*) 70 - 99 (mg/dL)   URINALYSIS, ROUTINE W REFLEX MICROSCOPIC     Status: Abnormal   Collection Time   11/12/11 11:00 PM      Component Value Range Comment   Color, Urine YELLOW  YELLOW     Appearance CLEAR  CLEAR     Specific Gravity, Urine 1.019  1.005 - 1.030     pH 6.0  5.0 - 8.0     Glucose, UA 100 (*) NEGATIVE (mg/dL)    Hgb urine dipstick MODERATE (*) NEGATIVE     Bilirubin Urine NEGATIVE  NEGATIVE     Ketones, ur NEGATIVE  NEGATIVE (mg/dL)    Protein, ur >300 (*) NEGATIVE (mg/dL)    Urobilinogen, UA 0.2  0.0 - 1.0 (mg/dL)    Nitrite NEGATIVE  NEGATIVE     Leukocytes, UA NEGATIVE  NEGATIVE    SODIUM, URINE, RANDOM     Status: Normal   Collection Time   11/12/11 11:00 PM      Component Value Range Comment   Sodium, Ur 77     CREATININE, URINE, RANDOM     Status: Normal   Collection Time   11/12/11 11:00 PM      Component Value Range Comment    Creatinine, Urine 78.83     URINE MICROSCOPIC-ADD ON     Status: Abnormal   Collection Time   11/12/11 11:00 PM      Component Value Range Comment   Squamous Epithelial / LPF MANY (*) RARE     WBC, UA 3-6  <3 (WBC/hpf)    RBC / HPF 3-6  <3 (RBC/hpf)    Bacteria, UA RARE  RARE    SEDIMENTATION RATE     Status: Abnormal   Collection Time   11/13/11  5:00 AM      Component Value Range Comment   Sed Rate 134 (*) 0 - 22 (mm/hr)   LACTATE DEHYDROGENASE     Status: Abnormal   Collection Time   11/13/11  5:00 AM      Component Value Range Comment   LD 466 (*) 94 - 250 (U/L)   CK     Status: Normal   Collection Time   11/13/11  5:00 AM      Component Value Range Comment   Total CK 32  7 - 177 (U/L)   PROCALCITONIN     Status: Normal   Collection Time   11/13/11  5:00 AM      Component Value Range Comment   Procalcitonin 0.34     GLUCOSE, CAPILLARY     Status: Abnormal   Collection Time   11/13/11  8:36 AM      Component Value Range Comment   Glucose-Capillary 113 (*) 70 - 99 (mg/dL)    Comment 1 Documented  in Chart      Comment 2 Notify RN     CSF CELL COUNT WITH DIFFERENTIAL     Status: Abnormal   Collection Time   11/13/11 10:00 AM      Component Value Range Comment   Tube # 3      Color, CSF COLORLESS  COLORLESS     Appearance, CSF CLEAR  CLEAR     Supernatant NOT INDICATED      RBC Count 59 (*) 0 (/cu mm)    WBC, CSF 2  0 - 5 (/cu mm)    Segmented Neutrophils-CSF TOO FEW TO COUNT, SMEAR AVAILABLE FOR REVIEW  0 - 6 (%)    Lymphs, CSF 0 (*) 40 - 80 (%)    Monocyte-Macrophage-Spinal Fluid 0 (*) 15 - 45 (%)    Eosinophils, CSF 0  0 - 1 (%)   PROTEIN AND GLUCOSE, CSF     Status: Normal   Collection Time   11/13/11 10:00 AM      Component Value Range Comment   Glucose, CSF 68  43 - 76 (mg/dL)    Total  Protein, CSF 18  15 - 45 (mg/dL)     Dg Chest 2 View  11/11/2011  *RADIOLOGY REPORT*  Clinical Data: 45 year old female with shortness of breath, weakness, fever of  unknown origin.  CHEST - 2 VIEW  Comparison: Chest CT 11/06/2011 and earlier.  Findings: [facility] and lateral views of the chest demonstrate coarse perihilar pulmonary opacity extending to both lung bases.  This appears beyond that expected for atelectasis.  Right side PICC line.  Cardiomegaly. Visualized tracheal air column is within normal limits.  No pneumothorax or pulmonary edema evident.  IMPRESSION: Streaky perihilar and bibasilar opacity beyond that expected for atelectasis.  Bilateral pneumonia suspected.  Original Report Authenticated By: Randall An, M.D.   Ct Head Wo Contrast  11/13/2011  *RADIOLOGY REPORT*  Clinical Data: Fever of unknown origin.  Altered mental status.  CT HEAD WITHOUT CONTRAST  Technique:  Contiguous axial images were obtained from the base of the skull through the vertex without contrast.  Comparison: 03/02/2010.  Findings: Mild motion artifact slightly limits evaluation.  Poor delineation of the sylvian fissure and the anterior interhemispheric fissure.  Etiology/significance indeterminate. This appearance can be seen with subtle subarachnoid hemorrhage however, no evidence of subarachnoid blood within the suprasellar cistern.  No evidence of intraventricular blood. This appearance can be seen with increased cerebral edema however, the thalami and lenticular nucleus as well as gray-white  differentiation is stable and well defined.  No intracranial mass lesion detected on this unenhanced exam. Chiari one malformation with postop changes post sub occipital craniectomy.  No hydrocephalus.  Question partially empty sella?  This can be seen with pseudotumor cerebri.  Exophthalmos.  Meningitis not excluded by unenhanced CT.  If there are progressive symptoms, MR imaging may be considered for further delineation.  No CT evidence of large acute infarct.  Small acute infarct cannot be excluded by CT. No intracranial mass lesion detected on this unenhanced exam.  IMPRESSION: Poor  delineation of the sylvian fissure and anterior interhemispheric fissure as discussed above.  Chiari one malformation with postop changes post sub occipital craniectomy.  Original Report Authenticated By: Doug Sou, M.D.   Dg Fluoro Guide Ndl Plc/bx  11/13/2011  *RADIOLOGY REPORT*  Clinical Data:  Headache, fever  LUMBAR PUNCTURE UNDER FLUOROSCOPY  Technique and findings: The procedure, risks (including but not limited to bleeding, infection, organ damage),  benefits, and alternatives were explained to the patient.  Questions regarding the procedure were encouraged and answered.  The patient understands and consents to the procedure. An appropriate skin entry site was determined fluoroscopically. Operator donned sterile gloves and mask. Skin site was marked, then prepped with Betadine, draped in usual sterile fashion, and infiltrated locally with 1% lidocaine. A 6" 20 gauge spinal needle advanced into the thecal sac at L3 from a right parasagittal interlaminar approach. Clear colorless CSF spontaneously returned, with a corrected opening pressure of 45 cm water. 52m CSF were collected and divided among 4 sterile vials for the requested laboratory studies. The needle was then removed. No immediate complication.  Fluoroscropy Time: 11 seconds  IMPRESSION  1. Technically successful lumbar puncture under fluoroscopy.  Original Report Authenticated By: DTrecia Rogers M.D.    Review of Systems  Constitutional: Positive for fever.  Skin:       Lymphedema    Blood pressure 195/102, pulse 96, temperature 99 F (37.2 C), temperature source Oral, resp. rate 29, height _0  (1.651 m), weight 177 kg (390 lb 3.4 oz), SpO2 97.00%. Physical Exam  Constitutional: She appears well-nourished.       Morbidly obese female  HENT:  Head: Normocephalic and atraumatic.  Eyes: Pupils are equal, round, and reactive to light.  Neck: Normal range of motion.  Cardiovascular: Normal rate and regular rhythm.     Respiratory: Effort normal and breath sounds normal.  GI: Soft. She exhibits distension.       Multiple redundant tissue with large panniculus  Musculoskeletal: Normal range of motion.       Marked edema in legs with folds of skin tender on left  Neurological:       Sl lethargic  Skin: Skin is dry.    Assessment/Plan: Acute Renal Injury of uncertain etiology, probably cytokine mediated related to infection Proteinuria due to Chronic Diabetic nephropathy  Supportive therapy, treat underlying infectious process. Check SPEP  Novali Vollman C 11/13/2011, 12:57 PM

## 2011-11-13 NOTE — Progress Notes (Signed)
Patient on Acyclovir IV empirically for HSV encephalitis. Noted Scr steadily increasing, GFR now < 50 ml/min. UOP low at 0.37m/kg/hr x 24h.   Plan: 1. Decrease Acyclovir dose to 135mkg/ IBW q 12 hours. 2. Will f/up SCr along with you and adjust acyclovir dose as needed  Zlata Alcaide K. PaPosey ProntoPharmD, BCPS.  Clinical Pharmacist Pager [phone] [id number]/24/2012 9:42 AM

## 2011-11-13 NOTE — Progress Notes (Signed)
Subjective: No new complaints  Objective: Weight change: 2 lb 3.3 oz (1 kg)  Intake/Output Summary (Last 24 hours) at 11/13/11 1219 Last data filed at 11/13/11 0900  Gross per 24 hour  Intake 3385.84 ml  Output    900 ml  Net 2485.84 ml   Blood pressure 195/102, pulse 96, temperature 99 F (37.2 C), temperature source Oral, resp. rate 29, height _0  (1.651 m), weight 390 lb 3.4 oz (177 kg), SpO2 97.00%. Temp:  [98.2 F (36.8 C)-99.2 F (37.3 C)] 99 F (37.2 C) (11/24 0834) Pulse Rate:  [78-97] 96  (11/24 1200) Resp:  [23-30] 29  (11/24 1200) BP: (157-195)/(86-102) 195/102 mmHg (11/24 1200) SpO2:  [95 %-99 %] 97 % (11/24 1200) Weight:  [390 lb 3.4 oz (177 kg)] 390 lb 3.4 oz (177 kg) (11/24 0040)  Physical Exam: General: Alert and awake, oriented x3, not in any acute distress. HEENT: anicteric sclera, pupils reactive to light and accommodation, EOMI CVS regular rate, normal r,  no murmur rubs or gallops Chest: clear to auscultation bilaterally, no wheezing, rales or rhonchi Abdomen: soft nontender, nondistended, normal bowel sounds, Extremities: severe lymphedema but no obvious cellulitis  Neuro: nonfocal  Lab Results:  Basename 11/12/11 0600 11/11/11 1600  WBC 4.7 4.6  HGB 9.5* 9.5*  HCT 28.8* 28.8*  PLT 290 303   BMET  Basename 11/12/11 0600 11/11/11 1600  NA 135 134*  K 4.9 5.2*  CL 104 104  CO2 21 20  GLUCOSE 113* 92  BUN 27* 27*  CREATININE 2.67* 2.58*  CALCIUM 8.1* 7.9*    Micro Results: Recent Results (from the past 240 hour(s))  URINE CULTURE     Status: Normal   Collection Time   11/05/11  4:13 PM      Component Value Range Status Comment   Specimen Description URINE, CLEAN CATCH   Final    Special Requests NONE   Final    Setup Time [id number]   Final    Colony Count 8,000 COLONIES/ML   Final    Culture INSIGNIFICANT GROWTH   Final    Report Status 11/07/2011 FINAL   Final   CULTURE, BLOOD (ROUTINE X 2)     Status: Normal   Collection  Time   11/06/11 10:40 AM      Component Value Range Status Comment   Specimen Description BLOOD PICC LINE DRAWN BY RN   Final    Special Requests BOTTLES DRAWN AEROBIC AND ANAEROBIC [place]   Final    Culture NO GROWTH 5 DAYS   Final    Report Status 11/11/2011 FINAL   Final   CULTURE, BLOOD (ROUTINE X 2)     Status: Normal   Collection Time   11/06/11 11:19 AM      Component Value Range Status Comment   Specimen Description BLOOD PICC LINE DRAWN BY RN   Final    Special Requests BOTTLES DRAWN AEROBIC AND ANAEROBIC [place]   Final    Culture NO GROWTH 5 DAYS   Final    Report Status 11/11/2011 FINAL   Final   CLOSTRIDIUM DIFFICILE BY PCR     Status: Normal   Collection Time   11/07/11  5:43 AM      Component Value Range Status Comment   C difficile by pcr NEGATIVE  NEGATIVE  Final   CULTURE, BLOOD (ROUTINE X 2)     Status: Normal   Collection Time   11/07/11  6:37 AM  Component Value Range Status Comment   Specimen Description BLOOD DRAWN BY RN PICC LINE   Final    Special Requests     Final    Value: BOTTLES DRAWN AEROBIC AND ANAEROBIC 6CC EACH BOTTLE   Culture NO GROWTH 5 DAYS   Final    Report Status 11/12/2011 FINAL   Final   MRSA PCR SCREENING     Status: Normal   Collection Time   11/11/11  2:39 PM      Component Value Range Status Comment   MRSA by PCR NEGATIVE  NEGATIVE  Final     Studies/Results: Ct Abdomen Pelvis Wo Contrast  11/06/2011  *RADIOLOGY REPORT*  Clinical Data:  Fever of unknown origin, nausea/vomiting  CT CHEST, ABDOMEN AND PELVIS WITHOUT CONTRAST  Technique:  Multidetector CT imaging of the chest, abdomen and pelvis was performed following the standard protocol without IV contrast.  Comparison:  Chest radiographs dated 11/04/2011  CT CHEST  Findings:  Motion degraded images.  Mild dependent bilateral lower lobe opacities, likely atelectasis. No pleural effusion or pneumothorax.  Status post right thyroidectomy.  Left thyroid is unremarkable.  Mild  cardiomegaly.  No pericardial effusion.  Small prevascular lymph nodes.  No suspicious axillary lymphadenopathy.  Right subclavian PICC terminating in the lower SVC.  Mild degenerative changes of the thoracic spine.  IMPRESSION: Motion degraded images.  Suspected bilateral lower lobe atelectasis.  Mild cardiomegaly.  CT ABDOMEN AND PELVIS  Findings:  Evaluation is constrained by artifact related to body habitus and lack of intravenous contrast.  Liver, spleen, pancreas, and adrenal glands are grossly unremarkable.  Gallbladder is grossly unremarkable.  No definite inflammatory changes by CT.  No.  Kidneys are unremarkable.  No renal calculi or hydronephrosis.  No evidence of bowel obstruction.  Colonic diverticulosis, without associated inflammatory changes.  No evidence of abdominal aortic aneurysm.  Retroperitoneal/left pelvic lymphadenopathy, including:  --2.5 x 1.9 cm left para-aortic node (series 2/image 65) --2.4 x 2.0 cm left common iliac node (series 2/image 85) --3.2 x 1.6 cm left external iliac node (series 2/image 107)  No abdominopelvic ascites.  No evidence of intra-abdominal abscess.  Fat-containing umbilical hernia (series 2/image 95).  Status post hysterectomy.  No adnexal masses.  Bladder is unremarkable.  Soft tissue stranding in the left lateral abdominal wall (series 2/image 82), incompletely visualized.  Visualized osseous structures are within normal limits.  IMPRESSION: Evaluation is constrained by artifact related to body habitus and lack of intravenous contrast.  No evidence of intra-abdominal abscess.  Retroperitoneal/left pelvic lymphadenopathy, as described above.  Soft tissue stranding in the left lateral abdominal wall, incompletely visualized.  Original Report Authenticated By: Julian Hy, M.D.   Dg Chest 2 View  11/11/2011  *RADIOLOGY REPORT*  Clinical Data: 45 year old female with shortness of breath, weakness, fever of unknown origin.  CHEST - 2 VIEW  Comparison: Chest CT  11/06/2011 and earlier.  Findings: AP and lateral views of the chest demonstrate coarse perihilar pulmonary opacity extending to both lung bases.  This appears beyond that expected for atelectasis.  Right side PICC line.  Cardiomegaly. Visualized tracheal air column is within normal limits.  No pneumothorax or pulmonary edema evident.  IMPRESSION: Streaky perihilar and bibasilar opacity beyond that expected for atelectasis.  Bilateral pneumonia suspected.  Original Report Authenticated By: Randall An, M.D.   Dg Chest 2 View  10/28/2011  *RADIOLOGY REPORT*  Clinical Data: PICC line placement  CHEST - 2 VIEW  Comparison: 10/28/2011  Findings: Left arm PICC  line again identified, extending right lateral to mediastinum on PA view. On the lateral view, this appears to be anteriorly directed, likely within a mediastinal draining vein. This should be repositioned. Cardiac silhouette appears enlarged. Mediastinal contours and pulmonary vascularity grossly normal. Linear subsegmental atelectasis or scarring left lower lobe. Lungs otherwise clear.  IMPRESSION: Left arm PICC line tip appears external to the mediastinum, suspect within and mediastinal draining vein. Recommend repositioning or replacement.  Original Report Authenticated By: Burnetta Sabin, M.D.   Ct Head Wo Contrast  11/13/2011  *RADIOLOGY REPORT*  Clinical Data: Fever of unknown origin.  Altered mental status.  CT HEAD WITHOUT CONTRAST  Technique:  Contiguous axial images were obtained from the base of the skull through the vertex without contrast.  Comparison: 03/02/2010.  Findings: Mild motion artifact slightly limits evaluation.  Poor delineation of the sylvian fissure and the anterior interhemispheric fissure.  Etiology/significance indeterminate. This appearance can be seen with subtle subarachnoid hemorrhage however, no evidence of subarachnoid blood within the suprasellar cistern.  No evidence of intraventricular blood. This appearance can be  seen with increased cerebral edema however, the thalami and lenticular nucleus as well as gray-white  differentiation is stable and well defined.  No intracranial mass lesion detected on this unenhanced exam. Chiari one malformation with postop changes post sub occipital craniectomy.  No hydrocephalus.  Question partially empty sella?  This can be seen with pseudotumor cerebri.  Exophthalmos.  Meningitis not excluded by unenhanced CT.  If there are progressive symptoms, MR imaging may be considered for further delineation.  No CT evidence of large acute infarct.  Small acute infarct cannot be excluded by CT. No intracranial mass lesion detected on this unenhanced exam.  IMPRESSION: Poor delineation of the sylvian fissure and anterior interhemispheric fissure as discussed above.  Chiari one malformation with postop changes post sub occipital craniectomy.  Original Report Authenticated By: Doug Sou, M.D.   Ct Chest Wo Contrast  11/06/2011  *RADIOLOGY REPORT*  Clinical Data:  Fever of unknown origin, nausea/vomiting  CT CHEST, ABDOMEN AND PELVIS WITHOUT CONTRAST  Technique:  Multidetector CT imaging of the chest, abdomen and pelvis was performed following the standard protocol without IV contrast.  Comparison:  Chest radiographs dated 11/04/2011  CT CHEST  Findings:  Motion degraded images.  Mild dependent bilateral lower lobe opacities, likely atelectasis. No pleural effusion or pneumothorax.  Status post right thyroidectomy.  Left thyroid is unremarkable.  Mild cardiomegaly.  No pericardial effusion.  Small prevascular lymph nodes.  No suspicious axillary lymphadenopathy.  Right subclavian PICC terminating in the lower SVC.  Mild degenerative changes of the thoracic spine.  IMPRESSION: Motion degraded images.  Suspected bilateral lower lobe atelectasis.  Mild cardiomegaly.  CT ABDOMEN AND PELVIS  Findings:  Evaluation is constrained by artifact related to body habitus and lack of intravenous contrast.   Liver, spleen, pancreas, and adrenal glands are grossly unremarkable.  Gallbladder is grossly unremarkable.  No definite inflammatory changes by CT.  No.  Kidneys are unremarkable.  No renal calculi or hydronephrosis.  No evidence of bowel obstruction.  Colonic diverticulosis, without associated inflammatory changes.  No evidence of abdominal aortic aneurysm.  Retroperitoneal/left pelvic lymphadenopathy, including:  --2.5 x 1.9 cm left para-aortic node (series 2/image 65) --2.4 x 2.0 cm left common iliac node (series 2/image 85) --3.2 x 1.6 cm left external iliac node (series 2/image 107)  No abdominopelvic ascites.  No evidence of intra-abdominal abscess.  Fat-containing umbilical hernia (series 2/image 95).  Status post  hysterectomy.  No adnexal masses.  Bladder is unremarkable.  Soft tissue stranding in the left lateral abdominal wall (series 2/image 82), incompletely visualized.  Visualized osseous structures are within normal limits.  IMPRESSION: Evaluation is constrained by artifact related to body habitus and lack of intravenous contrast.  No evidence of intra-abdominal abscess.  Retroperitoneal/left pelvic lymphadenopathy, as described above.  Soft tissue stranding in the left lateral abdominal wall, incompletely visualized.  Original Report Authenticated By: Julian Hy, M.D.   US Abdomen Complete  11/10/2011  *RADIOLOGY REPORT*  Clinical Data:  Elevated liver function tests.  Nausea vomiting. Diabetes.  COMPLETE ABDOMINAL ULTRASOUND  Comparison:  CT scan dated 11/06/2011  Findings:  Gallbladder:  No gallstones, gallbladder wall thickening, or pericholecystic fluid.Negative sonographic Murphy's sign.  Common bile duct:  Normal.  4.9 mm in diameter.  Liver:  Hepatomegaly with diffuse increased echogenicity consistent with hepatic steatosis.  No focal lesions.  No biliary ductal dilatation.  IVC:  Normal.  Pancreas:  Normal.  Spleen:  Normal.  9.9 cm in length.  Right Kidney:  15.6 cm in length.   Increased echogenicity of the renal parenchyma suggesting renal medical disease.  No hydronephrosis.  Left Kidney:  11.8 cm in length.  No hydronephrosis or mass. Detailed evaluation of the left kidney is suboptimal.  Abdominal aorta:  Maximal diameter 2.4 cm.  IMPRESSION: Hepatomegaly with hepatic steatosis. Slight increased echogenicity of the right renal parenchyma suggestive of renal medical disease. The left renal parenchyma is not well seen on this ultrasound but the kidney was not atrophic on the CT scan of 11/06/2011.  Original Report Authenticated By: Larey Seat, M.D.   Ir US Guide Vasc Access Right  10/29/2011  *RADIOLOGY REPORT*  Clinical Data: Cellulitis and needs IV access.  PICC LINE PLACEMENT WITH ULTRASOUND AND FLUOROSCOPIC  GUIDANCE  Fluoroscopy Time: 0.3 minutes.  The right arm was prepped with chlorhexidine, draped in the usual sterile fashion using maximum barrier technique (cap and mask, sterile gown, sterile gloves, large sterile sheet, hand hygiene and cutaneous antisepsis) and infiltrated locally with 1% Lidocaine.  Ultrasound demonstrated patency of the right basilic vein, and this was documented with an image.  Under real-time ultrasound guidance, this vein was accessed with a 21 gauge micropuncture needle and image documentation was performed.  The needle was exchanged over a guidewire for a peel-away sheath through which a five [place] dual lumen PICC trimmed to 41 cm was advanced, positioned with its tip at the lower SVC.  Fluoroscopy during the procedure and fluoro spot radiograph confirms appropriate catheter position.  The catheter was flushed, secured to the skin with Prolene sutures, and covered with a sterile dressing.  Complications:  None  IMPRESSION: Successful right arm PICC line placement with ultrasound and fluoroscopic guidance.  The catheter is ready for use.  Original Report Authenticated By: Markus Daft, M.D.   Dg Chest Portable 1 View  11/04/2011  *RADIOLOGY  REPORT*  Clinical Data: Cough, shortness of breath, fever  PORTABLE CHEST - 1 VIEW  Comparison: Chest x-ray of 10/28/2011  Findings: A right upper extremity PICC line is present with the tip seen to the lower SVC.  No pneumothorax is noted.  Cardiomegaly is stable.  No bony abnormality is seen other than degenerative change throughout the thoracic spine.  IMPRESSION: Right PICC line tip in lower SVC.  No pneumothorax.  Original Report Authenticated By: Joretta Bachelor, M.D.   Dg Chest Portable 1 View  10/28/2011  *RADIOLOGY REPORT*  Clinical Data:  Left PICC line placement.  PORTABLE CHEST - 1 VIEW  Comparison: 10/26/2011  Findings: Left PICC line is in place.  The tip projects over the medial right lung and appears to be beyond the SVC margin.  I cannot exclude that this is extravascular.  Mild cardiomegaly.  Bibasilar atelectasis.  No effusions.  IMPRESSION: Left PICC line tip projects in beyond the SVC margin over the medial right lung.  Cannot exclude this is extravascular.  Consider replacement.  Original Report Authenticated By: Raelyn Number, M.D.   Dg Chest Portable 1 View  10/26/2011  *RADIOLOGY REPORT*  Clinical Data: Fever.  Cough.  PORTABLE CHEST - 1 VIEW  Comparison: 05/27/2010.  Findings: Cardiomegaly.  Central pulmonary vascular prominence.  No segmental infiltrate.  Limited evaluation of the mediastinum by technique.  The patient would eventually benefit from follow-up two-view chest with cardiac leads removed.  No gross pneumothorax.  IMPRESSION: Cardiomegaly with central pulmonary vascular prominence.  Please see above.  Original Report Authenticated By: Doug Sou, M.D.   Dg Fluoro Guide Ndl Plc/bx  11/13/2011  *RADIOLOGY REPORT*  Clinical Data:  Headache, fever  LUMBAR PUNCTURE UNDER FLUOROSCOPY  Technique and findings: The procedure, risks (including but not limited to bleeding, infection, organ damage), benefits, and alternatives were explained to the patient.  Questions regarding  the procedure were encouraged and answered.  The patient understands and consents to the procedure. An appropriate skin entry site was determined fluoroscopically. Operator donned sterile gloves and mask. Skin site was marked, then prepped with Betadine, draped in usual sterile fashion, and infiltrated locally with 1% lidocaine. A 6" 20 gauge spinal needle advanced into the thecal sac at L3 from a right parasagittal interlaminar approach. Clear colorless CSF spontaneously returned, with a corrected opening pressure of 45 cm water. 73m CSF were collected and divided among 4 sterile vials for the requested laboratory studies. The needle was then removed. No immediate complication.  Fluoroscropy Time: 11 seconds  IMPRESSION  1. Technically successful lumbar puncture under fluoroscopy.  Original Report Authenticated By: DTrecia Rogers M.D.   Ir Central Line Left  10/29/2011  *RADIOLOGY REPORT*  Clinical Data: Cellulitis and needs IV access.  PICC LINE PLACEMENT WITH ULTRASOUND AND FLUOROSCOPIC  GUIDANCE  Fluoroscopy Time: 0.3 minutes.  The right arm was prepped with chlorhexidine, draped in the usual sterile fashion using maximum barrier technique (cap and mask, sterile gown, sterile gloves, large sterile sheet, hand hygiene and cutaneous antisepsis) and infiltrated locally with 1% Lidocaine.  Ultrasound demonstrated patency of the right basilic vein, and this was documented with an image.  Under real-time ultrasound guidance, this vein was accessed with a 21 gauge micropuncture needle and image documentation was performed.  The needle was exchanged over a guidewire for a peel-away sheath through which a five [place] lumen PICC trimmed to 41 cm was advanced, positioned with its tip at the lower SVC.  Fluoroscopy during the procedure and fluoro spot radiograph confirms appropriate catheter position.  The catheter was flushed, secured to the skin with Prolene sutures, and covered with a sterile dressing.   Complications:  None  IMPRESSION: Successful right arm PICC line placement with ultrasound and fluoroscopic guidance.  The catheter is ready for use.  Original Report Authenticated By: AMarkus Daft M.D.    Antibiotics:  Anti-infectives     Start     Dose/Rate Route Frequency Ordered Stop   11/13/11 1000   acyclovir (ZOVIRAX) 570 mg in dextrose 5 % 100 mL IVPB  10 mg/kg  57 kg (Ideal) 111.4 mL/hr over 60 Minutes Intravenous Every 12 hours 11/13/11 0939     11/11/11 2359   ciprofloxacin (CIPRO) IVPB 400 mg  Status:  Discontinued        400 mg 200 mL/hr over 60 Minutes Intravenous Every 12 hours 11/11/11 2157 11/12/11 1519   11/11/11 2300   cefTAZidime (FORTAZ) 1 g in dextrose 5 % 50 mL IVPB  Status:  Discontinued        1 g 100 mL/hr over 30 Minutes Intravenous 3 times per day 11/11/11 2157 11/12/11 1519   11/11/11 1800   acyclovir (ZOVIRAX) 570 mg in dextrose 5 % 100 mL IVPB  Status:  Discontinued        10 mg/kg  57 kg (Ideal) 111.4 mL/hr over 60 Minutes Intravenous Every 8 hours 11/11/11 1726 11/13/11 0939   11/11/11 1645   acyclovir (ZOVIRAX) 1,720 mg in dextrose 5 % 250 mL IVPB  Status:  Discontinued        10 mg/kg  171.9 kg 284.4 mL/hr over 60 Minutes Intravenous 3 times per day 11/11/11 1643 11/11/11 1722   11/11/11 1200   linezolid (ZYVOX) IVPB 600 mg  Status:  Discontinued        600 mg 300 mL/hr over 60 Minutes Intravenous Every 12 hours 11/11/11 0905 11/11/11 1643   11/11/11 1100   imipenem-cilastatin (PRIMAXIN) 500 mg in sodium chloride 0.9 % 100 mL IVPB  Status:  Discontinued        500 mg 200 mL/hr over 30 Minutes Intravenous Every 8 hours 11/11/11 0900 11/11/11 1643   11/11/11 1000   linezolid (ZYVOX) IVPB 600 mg  Status:  Discontinued        600 mg 300 mL/hr over 60 Minutes Intravenous Every 12 hours 11/11/11 0857 11/11/11 0902   11/11/11 0915   linezolid (ZYVOX) IVPB 600 mg  Status:  Discontinued        600 mg 300 mL/hr over 60 Minutes Intravenous Every  12 hours 11/11/11 0903 11/11/11 0904   11/10/11 1745   fluconazole (DIFLUCAN) tablet 100 mg  Status:  Discontinued        100 mg Oral Daily 11/10/11 1742 11/12/11 1521   11/09/11 2100   tobramycin (NEBCIN) 620 mg in dextrose 5 % 100 mL IVPB  Status:  Discontinued        7 mg/kg  88.5 kg (Order-Specific) 115.5 mL/hr over 60 Minutes Intravenous Every 48 hours 11/08/11 1018 11/10/11 0948   11/09/11 1000   vancomycin (VANCOCIN) 1,500 mg in sodium chloride 0.9 % 500 mL IVPB  Status:  Discontinued        1,500 mg 250 mL/hr over 120 Minutes Intravenous Every 24 hours 11/08/11 1015 11/10/11 0819   11/07/11 2300   tobramycin (NEBCIN) 620 mg in dextrose 5 % 100 mL IVPB  Status:  Discontinued        7 mg/kg  88.5 kg (Order-Specific) 115.5 mL/hr over 60 Minutes Intravenous Every 36 hours 11/07/11 0757 11/08/11 1018   11/06/11 2100   vancomycin (VANCOCIN) IVPB 1000 mg/200 mL premix  Status:  Discontinued        1,000 mg 200 mL/hr over 60 Minutes Intravenous Every 12 hours 11/06/11 0841 11/08/11 0937   11/06/11 1230   tobramycin (NEBCIN) 620 mg in dextrose 5 % 100 mL IVPB  Status:  Discontinued        7 mg/kg  88.5 kg (Order-Specific) 115.5 mL/hr over 60 Minutes Intravenous Every 24 hours  11/06/11 1133 11/07/11 0757   11/05/11 1000   ciprofloxacin (CIPRO) IVPB 400 mg  Status:  Discontinued        400 mg 200 mL/hr over 60 Minutes Intravenous Every 12 hours 11/05/11 0938 11/11/11 1643   11/04/11 2200   vancomycin (VANCOCIN) IVPB 1000 mg/200 mL premix  Status:  Discontinued        1,000 mg 200 mL/hr over 60 Minutes Intravenous Every 8 hours 11/04/11 1914 11/06/11 0835   11/04/11 2000   cefTRIAXone (ROCEPHIN) 1 g in dextrose 5 % 50 mL IVPB  Status:  Discontinued        1 g 100 mL/hr over 30 Minutes Intravenous Every 24 hours 11/04/11 1846 11/10/11 0819          Medications: Scheduled Meds:   . acyclovir  10 mg/kg (Ideal) Intravenous Q12H  . alteplase  2 mg Intracatheter Once  . feeding  supplement  237 mL Oral TID WC  . feeding supplement  1 Container Oral BID BM  . Flora-Q  1 capsule Oral Daily  . insulin aspart  0-20 Units Subcutaneous TID WC  . insulin aspart  0-5 Units Subcutaneous QHS  . insulin aspart  5 Units Subcutaneous TID WC  . insulin glargine  10 Units Subcutaneous Daily  . metoCLOPramide (REGLAN) injection  5 mg Intravenous TID WC & HS  . metoprolol  5 mg Intravenous Q6H  . ondansetron  4 mg Intravenous QID  . pantoprazole (PROTONIX) IV  40 mg Intravenous Q12H  . DISCONTD: acyclovir  10 mg/kg (Ideal) Intravenous Q8H  . DISCONTD: alteplase  2 mg Intracatheter Once  . DISCONTD: alteplase  2 mg Intracatheter Once  . DISCONTD: cefTAZidime (FORTAZ) IV  1 g Intravenous Q8H  . DISCONTD: ciprofloxacin  400 mg Intravenous Q12H  . DISCONTD: enoxaparin  80 mg Subcutaneous Q24H  . DISCONTD: fluconazole  100 mg Oral Daily  . DISCONTD: insulin glargine  20 Units Subcutaneous Daily   Continuous Infusions:   . sodium chloride 50 mL/hr at 11/13/11 0816   PRN Meds:.acetaminophen, acetaminophen, hydrALAZINE, ibuprofen, oxyCODONE-acetaminophen, phenol, senna-docusate, zolpidem, DISCONTD: hydrALAZINE, DISCONTD: oxyCODONE-acetaminophen, DISCONTD: promethazine  Assessment/Plan: [name] is a 45 y.o. female with With diabetes mellitus and coronary artery disease and chronic lymphedema who was admitted to [facility] with cellulitis involving her left lower extremity. She was treated with intravenous antibiotics vancomycin and Zosyn while at any [facility] was discharged to home with IV ceftriaxone and vancomycin. While, she felt well but approximately 3 days after discharge she developed fevers malaise nausea and vomiting. She was readmitted to [facility] and started on Achromycin Zosyn. She continued to have fevers and ciprofloxacin was added as was tobramycin. Despite all these various antibacterial antibiotics the patient continued to have high-grade fevers as well as  intermittent confusion and largely correlated with her fevers without any pain she had a CT of the chest that was unrevealing a CT of the abdomen and pelvis that did show some pelvic lymphadenopathy. There is no obvious intra-abdominal abscess found on exam. Blood cultures and urine cultures were all negative for C. difficile by PCR was negative . She was ultimately transferred over to [facility] for further evaluation and treatment. Her CXR here was read as showing findings concerning for pna> atelectasis (though she has been on very very broad spectrum antibiotics in interim with no other films for past 7 days) Her PCT is normal. She was started on acylovir and restarted on antibacterial antibiotics by [facility]  on admission when LP was delayed. I stopped her antibacterials yesterday. LP done today is completely benign. She is actually better today. MRI of brain not possible in house due to wight problems.  FUO: --followup on labs drawn, check RPR, would withold antibacterials at present  Encephalopathy: I am skeptical for HSV encephalitis and now PCR for HSV will be less sensitive on acyclovir, Patient needs MRI brain at open mri center Monday or as soon as possible  ?PNA: --She may have aspirated during her stay at [facility] . I am not overwhelmed by the CXR findings and we don not know what these findings mean in context over several weeks of broad spectrum antibiotics and interval of 7 days wihout interval imaging --should her fevers return and clinical status suggest PNA, I would get a Ct chest again to better elucidate lungs. I am very skeptical of pneumonia that is active at this time    LOS: 9 days   Alcide Evener 11/13/2011, 12:19 PM

## 2011-11-13 NOTE — Progress Notes (Signed)
Subjective: S: Patient seen and examined, husband at the bedside. She is much more alert and oriented today, no specific complaints, headache improved. No acute issues overnight, no temp spikes overnight. Per husband, mental status close to baseline.  Objective: Weight change: 1 kg (2 lb 3.3 oz)  Intake/Output Summary (Last 24 hours) at 11/13/11 0951 Last data filed at 11/13/11 0900  Gross per 24 hour  Intake 3675.42 ml  Output   1350 ml  Net 2325.42 ml   Blood pressure 175/94, pulse 78, temperature 99 F (37.2 C), temperature source Oral, resp. rate 30, height _0  (1.651 m), weight 177 kg (390 lb 3.4 oz), SpO2 98.00%.  Physical Exam: General: Alert and awake, oriented x3, not in any acute distress. HEENT: anicteric sclera, pupils reactive to light and accommodation, EOMI CVS: S1-S2 clear, no murmur rubs or gallops Chest: clear to auscultation bilaterally, no wheezing, rales or rhonchi Abdomen: soft nontender, nondistended, normal bowel sounds, no organomegaly Extremities: no cyanosis, clubbing or edema noted bilaterally Neuro: Cranial nerves II-XII intact, no focal neurological deficits  Lab Results: Basic Metabolic Panel:  Lab 18/56/31 0600 11/11/11 1600  NA 135 134*  K 4.9 5.2*  CL 104 104  CO2 21 20  GLUCOSE 113* 92  BUN 27* 27*  CREATININE 2.67* 2.58*  CALCIUM 8.1* 7.9*  MG -- 1.8  PHOS -- 3.9   Liver Function Tests:  Lab 11/12/11 0600 11/11/11 0620  AST 58* 49*  ALT 31 33  ALKPHOS 284* 304*  BILITOT 0.2* 0.1*  PROT 5.9* 5.7*  ALBUMIN 1.9* 1.8*    Lab 11/09/11 0512  LIPASE --  AMYLASE 102    Lab 11/11/11 2107  AMMONIA 47   CBC:  Lab 11/12/11 0600 11/11/11 1600  WBC 4.7 4.6  NEUTROABS -- 3.3  HGB 9.5* 9.5*  HCT 28.8* 28.8*  MCV 85.2 85.2  PLT 290 303   Cardiac Enzymes:  Lab 11/13/11 0500 11/12/11 0840 11/12/11 0230 11/11/11 1600  CKTOTAL 32 38 42 --  CKMB -- 1.4 1.4 1.3  CKMBINDEX -- -- -- --  TROPONINI -- <0.30 <0.30 <0.30    BNP:  Lab 11/11/11 1600  POCBNP 497.9*   CBG:  Lab 11/13/11 0836 11/12/11 2220 11/12/11 1200 11/12/11 0832 11/11/11 2254  GLUCAP 113* 140* 122* 111* 93     Micro Results: Recent Results (from the past 240 hour(s))  URINE CULTURE     Status: Normal   Collection Time   11/05/11  4:13 PM      Component Value Range Status Comment   Specimen Description URINE, CLEAN CATCH   Final    Special Requests NONE   Final    Setup Time [id number]   Final    Colony Count 8,000 COLONIES/ML   Final    Culture INSIGNIFICANT GROWTH   Final    Report Status 11/07/2011 FINAL   Final   CULTURE, BLOOD (ROUTINE X 2)     Status: Normal   Collection Time   11/06/11 10:40 AM      Component Value Range Status Comment   Specimen Description BLOOD PICC LINE DRAWN BY RN   Final    Special Requests BOTTLES DRAWN AEROBIC AND ANAEROBIC [place]   Final    Culture NO GROWTH 5 DAYS   Final    Report Status 11/11/2011 FINAL   Final   CULTURE, BLOOD (ROUTINE X 2)     Status: Normal   Collection Time   11/06/11 11:19 AM  Component Value Range Status Comment   Specimen Description BLOOD PICC LINE DRAWN BY RN   Final    Special Requests BOTTLES DRAWN AEROBIC AND ANAEROBIC [place]   Final    Culture NO GROWTH 5 DAYS   Final    Report Status 11/11/2011 FINAL   Final   CLOSTRIDIUM DIFFICILE BY PCR     Status: Normal   Collection Time   11/07/11  5:43 AM      Component Value Range Status Comment   C difficile by pcr NEGATIVE  NEGATIVE  Final   CULTURE, BLOOD (ROUTINE X 2)     Status: Normal   Collection Time   11/07/11  6:37 AM      Component Value Range Status Comment   Specimen Description BLOOD DRAWN BY RN PICC LINE   Final    Special Requests     Final    Value: BOTTLES DRAWN AEROBIC AND ANAEROBIC 6CC EACH BOTTLE   Culture NO GROWTH 5 DAYS   Final    Report Status 11/12/2011 FINAL   Final   MRSA PCR SCREENING     Status: Normal   Collection Time   11/11/11  2:39 PM      Component Value Range Status  Comment   MRSA by PCR NEGATIVE  NEGATIVE  Final     Studies/Results: Ct Abdomen Pelvis Wo Contrast  11/06/2011  *RADIOLOGY REPORT*  Clinical Data:  Fever of unknown origin, nausea/vomiting  CT CHEST, ABDOMEN AND PELVIS WITHOUT CONTRAST  Technique:  Multidetector CT imaging of the chest, abdomen and pelvis was performed following the standard protocol without IV contrast.  Comparison:  Chest radiographs dated 11/04/2011  CT CHEST  Findings:  Motion degraded images.  Mild dependent bilateral lower lobe opacities, likely atelectasis. No pleural effusion or pneumothorax.  Status post right thyroidectomy.  Left thyroid is unremarkable.  Mild cardiomegaly.  No pericardial effusion.  Small prevascular lymph nodes.  No suspicious axillary lymphadenopathy.  Right subclavian PICC terminating in the lower SVC.  Mild degenerative changes of the thoracic spine.  IMPRESSION: Motion degraded images.  Suspected bilateral lower lobe atelectasis.  Mild cardiomegaly.  CT ABDOMEN AND PELVIS  Findings:  Evaluation is constrained by artifact related to body habitus and lack of intravenous contrast.  Liver, spleen, pancreas, and adrenal glands are grossly unremarkable.  Gallbladder is grossly unremarkable.  No definite inflammatory changes by CT.  No.  Kidneys are unremarkable.  No renal calculi or hydronephrosis.  No evidence of bowel obstruction.  Colonic diverticulosis, without associated inflammatory changes.  No evidence of abdominal aortic aneurysm.  Retroperitoneal/left pelvic lymphadenopathy, including:  --2.5 x 1.9 cm left para-aortic node (series 2/image 65) --2.4 x 2.0 cm left common iliac node (series 2/image 85) --3.2 x 1.6 cm left external iliac node (series 2/image 107)  No abdominopelvic ascites.  No evidence of intra-abdominal abscess.  Fat-containing umbilical hernia (series 2/image 95).  Status post hysterectomy.  No adnexal masses.  Bladder is unremarkable.  Soft tissue stranding in the left lateral abdominal  wall (series 2/image 82), incompletely visualized.  Visualized osseous structures are within normal limits.  IMPRESSION: Evaluation is constrained by artifact related to body habitus and lack of intravenous contrast.  No evidence of intra-abdominal abscess.  Retroperitoneal/left pelvic lymphadenopathy, as described above.  Soft tissue stranding in the left lateral abdominal wall, incompletely visualized.  Original Report Authenticated By: Julian Hy, M.D.   Dg Chest 2 View  11/11/2011  *RADIOLOGY REPORT*  Clinical Data: 45 year old female with  shortness of breath, weakness, fever of unknown origin.  CHEST - 2 VIEW  Comparison: Chest CT 11/06/2011 and earlier.  Findings: AP and lateral views of the chest demonstrate coarse perihilar pulmonary opacity extending to both lung bases.  This appears beyond that expected for atelectasis.  Right side PICC line.  Cardiomegaly. Visualized tracheal air column is within normal limits.  No pneumothorax or pulmonary edema evident.  IMPRESSION: Streaky perihilar and bibasilar opacity beyond that expected for atelectasis.  Bilateral pneumonia suspected.  Original Report Authenticated By: Randall An, M.D.   Dg Chest 2 View  10/28/2011  *RADIOLOGY REPORT*  Clinical Data: PICC line placement  CHEST - 2 VIEW  Comparison: 10/28/2011  Findings: Left arm PICC line again identified, extending right lateral to mediastinum on PA view. On the lateral view, this appears to be anteriorly directed, likely within a mediastinal draining vein. This should be repositioned. Cardiac silhouette appears enlarged. Mediastinal contours and pulmonary vascularity grossly normal. Linear subsegmental atelectasis or scarring left lower lobe. Lungs otherwise clear.  IMPRESSION: Left arm PICC line tip appears external to the mediastinum, suspect within and mediastinal draining vein. Recommend repositioning or replacement.  Original Report Authenticated By: Burnetta Sabin, M.D.   Ct Chest Wo  Contrast  11/06/2011  *RADIOLOGY REPORT*  Clinical Data:  Fever of unknown origin, nausea/vomiting  CT CHEST, ABDOMEN AND PELVIS WITHOUT CONTRAST  Technique:  Multidetector CT imaging of the chest, abdomen and pelvis was performed following the standard protocol without IV contrast.  Comparison:  Chest radiographs dated 11/04/2011  CT CHEST  Findings:  Motion degraded images.  Mild dependent bilateral lower lobe opacities, likely atelectasis. No pleural effusion or pneumothorax.  Status post right thyroidectomy.  Left thyroid is unremarkable.  Mild cardiomegaly.  No pericardial effusion.  Small prevascular lymph nodes.  No suspicious axillary lymphadenopathy.  Right subclavian PICC terminating in the lower SVC.  Mild degenerative changes of the thoracic spine.  IMPRESSION: Motion degraded images.  Suspected bilateral lower lobe atelectasis.  Mild cardiomegaly.  CT ABDOMEN AND PELVIS  Findings:  Evaluation is constrained by artifact related to body habitus and lack of intravenous contrast.  Liver, spleen, pancreas, and adrenal glands are grossly unremarkable.  Gallbladder is grossly unremarkable.  No definite inflammatory changes by CT.  No.  Kidneys are unremarkable.  No renal calculi or hydronephrosis.  No evidence of bowel obstruction.  Colonic diverticulosis, without associated inflammatory changes.  No evidence of abdominal aortic aneurysm.  Retroperitoneal/left pelvic lymphadenopathy, including:  --2.5 x 1.9 cm left para-aortic node (series 2/image 65) --2.4 x 2.0 cm left common iliac node (series 2/image 85) --3.2 x 1.6 cm left external iliac node (series 2/image 107)  No abdominopelvic ascites.  No evidence of intra-abdominal abscess.  Fat-containing umbilical hernia (series 2/image 95).  Status post hysterectomy.  No adnexal masses.  Bladder is unremarkable.  Soft tissue stranding in the left lateral abdominal wall (series 2/image 82), incompletely visualized.  Visualized osseous structures are within  normal limits.  IMPRESSION: Evaluation is constrained by artifact related to body habitus and lack of intravenous contrast.  No evidence of intra-abdominal abscess.  Retroperitoneal/left pelvic lymphadenopathy, as described above.  Soft tissue stranding in the left lateral abdominal wall, incompletely visualized.  Original Report Authenticated By: Julian Hy, M.D.   US Abdomen Complete  11/10/2011  *RADIOLOGY REPORT*  Clinical Data:  Elevated liver function tests.  Nausea vomiting. Diabetes.  COMPLETE ABDOMINAL ULTRASOUND  Comparison:  CT scan dated 11/06/2011  Findings:  Gallbladder:  No gallstones, gallbladder wall thickening, or pericholecystic fluid.Negative sonographic Murphy's sign.  Common bile duct:  Normal.  4.9 mm in diameter.  Liver:  Hepatomegaly with diffuse increased echogenicity consistent with hepatic steatosis.  No focal lesions.  No biliary ductal dilatation.  IVC:  Normal.  Pancreas:  Normal.  Spleen:  Normal.  9.9 cm in length.  Right Kidney:  15.6 cm in length.  Increased echogenicity of the renal parenchyma suggesting renal medical disease.  No hydronephrosis.  Left Kidney:  11.8 cm in length.  No hydronephrosis or mass. Detailed evaluation of the left kidney is suboptimal.  Abdominal aorta:  Maximal diameter 2.4 cm.  IMPRESSION: Hepatomegaly with hepatic steatosis. Slight increased echogenicity of the right renal parenchyma suggestive of renal medical disease. The left renal parenchyma is not well seen on this ultrasound but the kidney was not atrophic on the CT scan of 11/06/2011.  Original Report Authenticated By: Larey Seat, M.D.   Ir US Guide Vasc Access Right  10/29/2011  *RADIOLOGY REPORT*  Clinical Data: Cellulitis and needs IV access.  PICC LINE PLACEMENT WITH ULTRASOUND AND FLUOROSCOPIC  GUIDANCE  Fluoroscopy Time: 0.3 minutes.  The right arm was prepped with chlorhexidine, draped in the usual sterile fashion using maximum barrier technique (cap and mask, sterile  gown, sterile gloves, large sterile sheet, hand hygiene and cutaneous antisepsis) and infiltrated locally with 1% Lidocaine.  Ultrasound demonstrated patency of the right basilic vein, and this was documented with an image.  Under real-time ultrasound guidance, this vein was accessed with a 21 gauge micropuncture needle and image documentation was performed.  The needle was exchanged over a guidewire for a peel-away sheath through which a five [place] dual lumen PICC trimmed to 41 cm was advanced, positioned with its tip at the lower SVC.  Fluoroscopy during the procedure and fluoro spot radiograph confirms appropriate catheter position.  The catheter was flushed, secured to the skin with Prolene sutures, and covered with a sterile dressing.  Complications:  None  IMPRESSION: Successful right arm PICC line placement with ultrasound and fluoroscopic guidance.  The catheter is ready for use.  Original Report Authenticated By: Markus Daft, M.D.   Dg Chest Portable 1 View  11/04/2011  *RADIOLOGY REPORT*  Clinical Data: Cough, shortness of breath, fever  PORTABLE CHEST - 1 VIEW  Comparison: Chest x-ray of 10/28/2011  Findings: A right upper extremity PICC line is present with the tip seen to the lower SVC.  No pneumothorax is noted.  Cardiomegaly is stable.  No bony abnormality is seen other than degenerative change throughout the thoracic spine.  IMPRESSION: Right PICC line tip in lower SVC.  No pneumothorax.  Original Report Authenticated By: Joretta Bachelor, M.D.   Dg Chest Portable 1 View  10/28/2011  *RADIOLOGY REPORT*  Clinical Data: Left PICC line placement.  PORTABLE CHEST - 1 VIEW  Comparison: 10/26/2011  Findings: Left PICC line is in place.  The tip projects over the medial right lung and appears to be beyond the SVC margin.  I cannot exclude that this is extravascular.  Mild cardiomegaly.  Bibasilar atelectasis.  No effusions.  IMPRESSION: Left PICC line tip projects in beyond the SVC margin over the medial  right lung.  Cannot exclude this is extravascular.  Consider replacement.  Original Report Authenticated By: Raelyn Number, M.D.   Dg Chest Portable 1 View  10/26/2011  *RADIOLOGY REPORT*  Clinical Data: Fever.  Cough.  PORTABLE CHEST - 1 VIEW  Comparison: 05/27/2010.  Findings:  Cardiomegaly.  Central pulmonary vascular prominence.  No segmental infiltrate.  Limited evaluation of the mediastinum by technique.  The patient would eventually benefit from follow-up two-view chest with cardiac leads removed.  No gross pneumothorax.  IMPRESSION: Cardiomegaly with central pulmonary vascular prominence.  Please see above.  Original Report Authenticated By: Doug Sou, M.D.   Ir Central Line Left  10/29/2011  *RADIOLOGY REPORT*  Clinical Data: Cellulitis and needs IV access.  PICC LINE PLACEMENT WITH ULTRASOUND AND FLUOROSCOPIC  GUIDANCE  Fluoroscopy Time: 0.3 minutes.  The right arm was prepped with chlorhexidine, draped in the usual sterile fashion using maximum barrier technique (cap and mask, sterile gown, sterile gloves, large sterile sheet, hand hygiene and cutaneous antisepsis) and infiltrated locally with 1% Lidocaine.  Ultrasound demonstrated patency of the right basilic vein, and this was documented with an image.  Under real-time ultrasound guidance, this vein was accessed with a 21 gauge micropuncture needle and image documentation was performed.  The needle was exchanged over a guidewire for a peel-away sheath through which a five [place] dual lumen PICC trimmed to 41 cm was advanced, positioned with its tip at the lower SVC.  Fluoroscopy during the procedure and fluoro spot radiograph confirms appropriate catheter position.  The catheter was flushed, secured to the skin with Prolene sutures, and covered with a sterile dressing.  Complications:  None  IMPRESSION: Successful right arm PICC line placement with ultrasound and fluoroscopic guidance.  The catheter is ready for use.  Original Report  Authenticated By: Markus Daft, M.D.    Medications: Scheduled Meds:    . acyclovir  10 mg/kg (Ideal) Intravenous Q12H  . feeding supplement  237 mL Oral TID WC  . feeding supplement  1 Container Oral BID BM  . Flora-Q  1 capsule Oral Daily  . insulin aspart  0-20 Units Subcutaneous TID WC  . insulin aspart  0-5 Units Subcutaneous QHS  . insulin aspart  5 Units Subcutaneous TID WC  . insulin glargine  10 Units Subcutaneous Daily  . metoCLOPramide (REGLAN) injection  5 mg Intravenous TID WC & HS  . metoprolol  5 mg Intravenous Q6H  . ondansetron  4 mg Intravenous QID  . pantoprazole (PROTONIX) IV  40 mg Intravenous Q12H  . DISCONTD: acyclovir  10 mg/kg (Ideal) Intravenous Q8H  . DISCONTD: cefTAZidime (FORTAZ) IV  1 g Intravenous Q8H  . DISCONTD: ciprofloxacin  400 mg Intravenous Q12H  . DISCONTD: enoxaparin  80 mg Subcutaneous Q24H  . DISCONTD: fluconazole  100 mg Oral Daily  . DISCONTD: insulin glargine  20 Units Subcutaneous Daily   Continuous Infusions:    . sodium chloride 50 mL/hr at 11/13/11 0816   PRN Meds:.acetaminophen, acetaminophen, hydrALAZINE, ibuprofen, oxyCODONE-acetaminophen, phenol, promethazine, senna-docusate, zolpidem, DISCONTD: hydrALAZINE, DISCONTD: oxyCODONE-acetaminophen  Interval history:  Patient is a 45 year old female with history of diabetes mellitus, coronary disease, chronic lymphedema, morbid obesity who was initially admitted to [facility] with lower extremity cellulitis. Patient was treated with the IV antibiotics including vancomycin and Zosyn and then subsequently discharged to home with IV Rocephin and vancomycin. Patient was readmitted on 11/04/2011 at [facility] with high-grade fevers and intermittent confusion. Blood cultures, urine cultures were all negative, C. difficile negative, CT of the chest was unremarkable except atelectasis. CT of the abdomen, pelvis showed some pelvic lymphadenopathy. Patient was subsequently  transferred to [facility] on 11/11/2011 for further workup.  Assessment/Plan: Principal Problem:  *Altered mental state/FUO: Mental status significantly improved today, close to  baseline - Spinal tap under fluoroscopy today, the CSF labs per Dr. Drucilla Schmidt (ID) and Neuro - Unclear etiology at this time, possible multifactorial in etiology with differential including persistent fever, encephalopathy/meningitis (acute bacterial versus viral infection), on Zovirax - Discussed in detail with neurology,CT head today, EEG if needed - Patient will likely need an open MRI of the brain if workup above is negative, per ID, consider MRI bilateral lower extremity. MRI is not feasible in-house due to weight issues. Patient will likely need open MRI, unfortunately [facility] appears to be closed today for scheduling. - Vasculitis workup also in progress. - I discussed with Dr. [name] (ID) regarding the chest x-ray on 11/11/2011 which showed streaky bilateral opacities questioning pneumonia, patient had the CT of the chest on 11/06/2011 which had shown bilateral atelectasis. Patient had received IV broad-spectrum antibiotics at [facility], per ID recommendations, hold off on any more antibiotics at this time as it could be residual/resolving pneumonia or atelectasis.   LE cellulitis: Appears to be resolved, patient has significant lymphedema and morbid obesity.   Acute renal failure/dehydration - Dehydration resolved  - vasculitis work up, ANA, ANCA, complement levels, CRP in progress. ESR elevated. And continue to hold Demadex from the outpatient medications. - continue IVF, renal ultrasound was done on 11/10/2011 which showed hepatomegaly with hepatic steatosis, slight increased echogenicity of the right renal parenchyma suggestive of renal medical disease. - I discussed with renal service today for formal recommendations. Await BMET from today.    Anemia  - FOBT  positive, pt seen by GI at [facility] and they have started her on Reglan for nausea  - nausea and vomiting was thought to be secondary to gastroparesis, gastric emptying study pending on Monday.  Diabetes mellitus : Uncontrolled, HbA1c 10.2 on 11/11/2011 - Blood sugars controlled inpatient, Continue sliding scale insulin and Lantus   Morbid obesity: BMI over 40   Transaminitis  - Likely secondary to hepatic steatosis, hepatitis panel pending    Lymphedema of lower extremity  - severe and chronic, per pt's report now at her baseline   HTN (hypertension)  - continue when necessary medications, decrease IV fluids  HLD (hyperlipidemia)  - chronic, no statins secondary to tramsamnitis   Patient is full code  Disposition: Hemodynamically stable, mental status significantly improved, transfer to telemetry floor.   LOS: 9 days   RAI,RIPUDEEP 11/13/2011, 9:51 AM

## 2011-11-13 NOTE — Procedures (Signed)
LP under fluoro @ L3 OP 45cmH2O 41m clear colorless CSF No complication No blood loss. [facility]

## 2011-11-13 NOTE — Progress Notes (Signed)
PT Cancellation Note  Treatment cancelled today due to medical issues with patient which prohibited therapy  Treatment cancelled today due to patient receiving procedure or test   Treatment cancelled today due to patient's refusal to participate   Treatment cancelled today due to went for an LP then was on bedrest for 4 hours.  Now just got back in the bed from the [facility].---x

## 2011-11-13 NOTE — Progress Notes (Signed)
Subjective: Pt out of room.  Husband reports her to be much more alert, minimal to no confusion.  Slightly tremulous.  Objective: BP 175/94  Pulse 78  Temp(Src) 99 F (37.2 C) (Oral)  Resp 30  Ht _0  (1.651 m)  Wt 177 kg (390 lb 3.4 oz)  BMI 64.94 kg/m2  SpO2 98%  Intake/Output from previous day: 11/23 0701 - 11/24 0700 In: 3825.4 [P.O.:840; I.V.:2985.4] Out: 1350 [Urine:1350] Intake/Output this shift: Total I/O In: 50 [I.V.:50] Out: -   Exam not performed, as pt is away in radiology dept.  Lab Results:  Basename 11/12/11 0600 11/11/11 1600  WBC 4.7 4.6  HGB 9.5* 9.5*  HCT 28.8* 28.8*  PLT 290 303  NA 135 134*  K 4.9 5.2*  CL 104 104  CO2 21 20  GLUCOSE 113* 92  BUN 27* 27*  CREATININE 2.67* 2.58*  CALCIUM 8.1* 7.9*  LABA1C -- --    Studies/Results: Dg Chest 2 View  11/11/2011  *RADIOLOGY REPORT*  Clinical Data: 45 year old female with shortness of breath, weakness, fever of unknown origin.  CHEST - 2 VIEW  Comparison: Chest CT 11/06/2011 and earlier.  Findings: AP and lateral views of the chest demonstrate coarse perihilar pulmonary opacity extending to both lung bases.  This appears beyond that expected for atelectasis.  Right side PICC line.  Cardiomegaly. Visualized tracheal air column is within normal limits.  No pneumothorax or pulmonary edema evident.  IMPRESSION: Streaky perihilar and bibasilar opacity beyond that expected for atelectasis.  Bilateral pneumonia suspected.  Original Report Authenticated By: Randall An, M.D.      Assessment/Plan: Persistent fever Confusion- improving per report.      LP:  To be done in IR   Had not been done yet, as pt was getting lovenox.  Pt was offered bedside LP by Dr. [name] with ID, and  after lengthy discussion of the risks and benefits ultimately the patient and her husband refused to consent.  They prefer to have a lumbar puncture performed by interventional radiology.  Dr [name] recommends a cell  count  and differential protein and glucose culture as well as herpes simplex PCR varicella-zoster PCR. If  there is sufficient CSF but also consider sending a [place] Nile virus antibody as well as an enterovirus by PCR.  Unfortunately MRI was not obtained because the patient is to overweight to be put on the scanner.   Neurology will follow up after LP.  Call with questions.  LOS: 9 days   Berlin Hun PA-C [facility] [phone] 11/13/2011  9:01 AM

## 2011-11-14 ENCOUNTER — Inpatient Hospital Stay (HOSPITAL_COMMUNITY): Payer: PRIVATE HEALTH INSURANCE

## 2011-11-14 ENCOUNTER — Other Ambulatory Visit: Payer: Self-pay

## 2011-11-14 DIAGNOSIS — I319 Disease of pericardium, unspecified: Secondary | ICD-10-CM

## 2011-11-14 LAB — CBC
HCT: 30.3 % — ABNORMAL LOW (ref 36.0–46.0)
HCT: 30.6 % — ABNORMAL LOW (ref 36.0–46.0)
MCH: 27.7 pg (ref 26.0–34.0)
MCV: 84.6 fL (ref 78.0–100.0)
MCV: 84.1 fL (ref 78.0–100.0)
Platelets: 287 10*3/uL (ref 150–400)
RBC: 3.58 MIL/uL — ABNORMAL LOW (ref 3.87–5.11)
RBC: 3.64 MIL/uL — ABNORMAL LOW (ref 3.87–5.11)
WBC: 6.2 10*3/uL (ref 4.0–10.5)

## 2011-11-14 LAB — GLUCOSE, CAPILLARY
Glucose-Capillary: 105 mg/dL — ABNORMAL HIGH (ref 70–99)
Glucose-Capillary: 101 mg/dL — ABNORMAL HIGH (ref 70–99)

## 2011-11-14 LAB — COMPREHENSIVE METABOLIC PANEL
AST: 122 U/L — ABNORMAL HIGH (ref 0–37)
Albumin: 1.9 g/dL — ABNORMAL LOW (ref 3.5–5.2)
BUN: 24 mg/dL — ABNORMAL HIGH (ref 6–23)
BUN: 24 mg/dL — ABNORMAL HIGH (ref 6–23)
CO2: 22 mEq/L (ref 19–32)
Chloride: 106 mEq/L (ref 96–112)
Creatinine, Ser: 2.61 mg/dL — ABNORMAL HIGH (ref 0.50–1.10)
Creatinine, Ser: 2.71 mg/dL — ABNORMAL HIGH (ref 0.50–1.10)
GFR calc Af Amer: 24 mL/min — ABNORMAL LOW (ref >90)
GFR calc non Af Amer: 21 mL/min — ABNORMAL LOW (ref >90)
Glucose, Bld: 124 mg/dL — ABNORMAL HIGH (ref 70–99)
Total Bilirubin: 0.2 mg/dL — ABNORMAL LOW (ref 0.3–1.2)
Total Protein: 6 g/dL (ref 6.0–8.3)

## 2011-11-14 LAB — BLOOD GAS, ARTERIAL
Bicarbonate: 20.8 mEq/L (ref 20.0–24.0)
O2 Content: 3 L/min
O2 Saturation: 98.9 %
Patient temperature: 98.6

## 2011-11-14 LAB — CRYPTOCOCCAL ANTIGEN: Crypto Ag: NEGATIVE

## 2011-11-14 LAB — RENAL FUNCTION PANEL
Albumin: 1.7 g/dL — ABNORMAL LOW (ref 3.5–5.2)
BUN: 26 mg/dL — ABNORMAL HIGH (ref 6–23)
Calcium: 8.1 mg/dL — ABNORMAL LOW (ref 8.4–10.5)
Phosphorus: 4.2 mg/dL (ref 2.3–4.6)
Potassium: 4.6 mEq/L (ref 3.5–5.1)
Sodium: 137 mEq/L (ref 135–145)

## 2011-11-14 LAB — DIFFERENTIAL
Eosinophils Relative: 5 % (ref 0–5)
Lymphocytes Relative: 14 % (ref 12–46)
Lymphs Abs: 0.9 10*3/uL (ref 0.7–4.0)
Monocytes Absolute: 0.7 10*3/uL (ref 0.1–1.0)

## 2011-11-14 LAB — CARDIAC PANEL(CRET KIN+CKTOT+MB+TROPI): Relative Index: INVALID (ref 0.0–2.5)

## 2011-11-14 LAB — D-DIMER, QUANTITATIVE: D-Dimer, Quant: 5.16 ug/mL-FEU — ABNORMAL HIGH (ref 0.00–0.48)

## 2011-11-14 MED ORDER — PANTOPRAZOLE SODIUM 40 MG PO TBEC
40.0000 mg | DELAYED_RELEASE_TABLET | Freq: Every day | ORAL | Status: DC
  Administered 2011-11-14 – 2011-11-16 (×3): 40 mg via ORAL
  Filled 2011-11-14 (×3): qty 1

## 2011-11-14 MED ORDER — LEVALBUTEROL HCL 0.63 MG/3ML IN NEBU
0.6300 mg | INHALATION_SOLUTION | Freq: Four times a day (QID) | RESPIRATORY_TRACT | Status: DC | PRN
  Administered 2011-11-14 – 2011-11-16 (×3): 0.63 mg via RESPIRATORY_TRACT
  Filled 2011-11-14 (×3): qty 3

## 2011-11-14 NOTE — Progress Notes (Addendum)
Subjective: LP performed by radiology yesterday. Tolerated well. Overall confusion improved.  Feeling better. Objective: BP 151/91  Pulse 84  Temp(Src) 98.4 F (36.9 C) (Oral)  Resp 22  Ht _0  (1.651 m)  Wt 175.814 kg (387 lb 9.6 oz)  BMI 64.50 kg/m2  SpO2 100%  Intake/Output from previous day: 11/24 0701 - 11/25 0700 In: 1711.4 [P.O.:780; I.V.:820; IV Piggyback:111.4] Out: 1 [Stool:1] Intake/Output this shift: Total I/O In: 120 [P.O.:120] Out: -   Medications:  Scheduled:   . acyclovir  10 mg/kg (Ideal) Intravenous Q12H  . alteplase  2 mg Intracatheter Once  . amLODipine  10 mg Oral Daily  . cloNIDine  0.2 mg Oral Once  . feeding supplement  237 mL Oral TID WC  . feeding supplement  1 Container Oral BID BM  . Flora-Q  1 capsule Oral Daily  . insulin aspart  0-20 Units Subcutaneous TID WC  . insulin aspart  0-5 Units Subcutaneous QHS  . insulin aspart  5 Units Subcutaneous TID WC  . insulin glargine  10 Units Subcutaneous Daily  . metoCLOPramide (REGLAN) injection  5 mg Intravenous TID WC & HS  . metoprolol  5 mg Intravenous Q6H  . metoprolol tartrate  12.5 mg Oral BID  . ondansetron  4 mg Intravenous QID  . pantoprazole (PROTONIX) IV  40 mg Intravenous Q12H  . DISCONTD: alteplase  2 mg Intracatheter Once  . DISCONTD: alteplase  2 mg Intracatheter Once   Neurologic Exam: Mental Status: Alert, oriented to self and place, and reason in hospital.  Date 1967.  After re-orientation, pt still had difficulty recalling correct year.  Month November, President Obama.  Unable to spell world backwards, or count backwards by 5's.  Unable to recall 3 objects. Speech fluent without evidence of aphasia. Able to follow 2 step commands without difficulty. Cranial Nerves: II-. Visual fields grossly intact. III/IV/VI-Extraocular movements intact.  Pupils reactive bilaterally. V/VII-Smile symmetric XII-midline tongue extension Motor: 5/5 bilaterally with normal tone and  bulk Sensory:  light touch intact throughout  Lab Results:  LP Tube # 3   CSF  COLORLESS CSF CLEAR  RBC Count 59 (H) 0 /cu mm  WBC, CSF 2 0 - 5 /cu mm  Segmented Neutrophils-CSF TOO FEW TO COUNT, SMEAR AVAILABLE FOR REVIEW  0 - 6 % Lymphs, CSF 0 (L)  40 - 80 % Monocyte-Macrophage-Spinal Fluid 0 (L)  15 - 45 % Eosinophils, CSF 0    Glucose, CSF 68  Total Protein, CSF 18  ESR 134 (h) CRP 0.61 (h) LDH 466 (h) RF 16 (h)  Ferritin 2214 (h) Procalcitonin 0.34  Basename 11/14/11 0530 11/12/11 0600  WBC 4.4 4.7  HGB 9.9* 9.5*  HCT 30.3* 28.8*  PLT 287 290  NA 137 135  K 4.6 4.9  CL 107 104  CO2 24 21  GLUCOSE 113* 113*  BUN 26* 27*  CREATININE 2.73* 2.67*  CALCIUM 8.1* 8.1*  LABA1C -- --   Lipid Panel No results found for this basename: CHOL,TRIG,HDL,CHOLHDL,VLDL,LDLCALC in the last 72 hours  Studies/Results: 11/13/2011   CT HEAD WITHOUT CONTRAST  Findings: Mild motion artifact slightly limits evaluation.  Poor delineation of the sylvian fissure and the anterior interhemispheric fissure.  Etiology/significance indeterminate. This appearance can be seen with subtle subarachnoid hemorrhage however, no evidence of subarachnoid blood within the suprasellar cistern.  No evidence of intraventricular blood. This appearance can be seen with increased cerebral edema however, the thalami and lenticular nucleus as well as gray-white  differentiation is stable and well defined.  No intracranial mass lesion detected on this unenhanced exam. Chiari one malformation with postop changes post sub occipital craniectomy.  No hydrocephalus.  Question partially empty sella?  This can be seen with pseudotumor cerebri.  Exophthalmos.  Meningitis not excluded by unenhanced CT.  If there are progressive symptoms, MR imaging may be considered for further delineation.  No CT evidence of large acute infarct.  Small acute infarct cannot be excluded by CT. No intracranial mass lesion detected on this  unenhanced exam.  IMPRESSION: Poor delineation of the sylvian fissure and anterior interhemispheric fissure as discussed above.  Chiari one malformation with postop changes post sub occipital craniectomy.  Original Report Authenticated By: Doug Sou, M.D.   Assessment/Plan: 1.Fever- resolved, etiology unclear.  On acyclovir. 2.Confusion- seems to be clearing, however, deficits persist  with higher executive functioning and short term   recall. 3.LP- performed.  No obvious infection.  Several results still  Pending. 4. Inflammatory markers elevated; likely related to illness.    Medicine following.  Plan:  1. Agree with ID that HSV encephalitis is not likely as patient should look worse than she currently does, but if she has no renal contraindications, acyclovir may be continued until PCR is back. Defer evaluation for possible pneumonia with ID. Agree that she needs a stand up MRI the sooner the better. Please do EEG on Monday. Will follow.   Berlin Hun PA-C [facility] [phone] 11/14/2011  10:27 AM   LOS: 10 days

## 2011-11-14 NOTE — Progress Notes (Signed)
  Echocardiogram 2D Echocardiogram has been performed.  [name] 11/14/2011, 1:53 PM

## 2011-11-14 NOTE — Progress Notes (Signed)
Physical Therapy Evaluation Patient Details Name: [name] MRN: [id number] DOB: 03-Jul-1966 Today's Date: 11/14/2011  Problem List:  Patient Active Problem List  Diagnoses  . Lymphedema of lower extremity  . Cellulitis of left leg  . Diabetes mellitus  . Morbid obesity  . Fever of unknown origin  . Acute renal failure  . Anemia  . Altered mental state  . Hyperkalemia  . Leukopenia  . HTN (hypertension)  . HLD (hyperlipidemia)  . Transaminitis    Past Medical History:  Past Medical History  Diagnosis Date  . Diabetes mellitus   . Chronic pain   . Lymph edema   . Cellulitis   . Chiari malformation     s/p surgery  . S/P colonoscopy 05/26/2007    Dr. Laural Golden sigmoid diverticulosis random biopsies benign  . CAD (coronary artery disease)   . Hypercholesterolemia   . Hypertension   . Asthma   . S/P endoscopy 05/01/2009    Dr. Penelope Coop pill-induced esophageal ulcerations distal to midesophagus, 2 small ulcers in the antrum of the stomach   Past Surgical History:  Past Surgical History  Procedure Date  . Cesarean section      x 2  . Abdominal hysterectomy   . Tonsillectomy   . Adenoidectomy   . Thyroidectomy, partial   . Craniectomy suboccipital w/ cervical laminectomy / chiari     PT Assessment/Plan/Recommendation PT Assessment Clinical Impression Statement: Mrs. Signer demonstrate significant decline in pre-admission functional status.  Bil LE edema  is pt's primary limiting factor.  Pt also c/o diziziness with activity.   PT Recommendation/Assessment: Patient will need skilled PT in the acute care venue PT Problem List: Decreased strength;Decreased range of motion;Decreased activity tolerance;Decreased mobility;Obesity;Decreased skin integrity;Pain;Cardiopulmonary status limiting activity Barriers to Discharge: None PT Therapy Diagnosis : Difficulty walking;Generalized weakness (Lymphedema ) PT Plan PT Frequency: Min 2X/week PT Treatment/Interventions:  Gait training;DME instruction;Functional mobility training;Therapeutic exercise;Patient/family education;Wheelchair mobility training PT Recommendation Recommendations for Other Services: Rehab consult Follow Up Recommendations: Inpatient Rehab Equipment Recommended: Defer to next venue PT Goals  Acute Rehab PT Goals PT Goal Formulation: With patient Time For Goal Achievement: 2 weeks Pt will Roll Supine to Right Side: with modified independence;with rail PT Goal: Rolling Supine to Right Side - Progress: Other (comment) Pt will Roll Supine to Left Side: with modified independence;with rail PT Goal: Rolling Supine to Left Side - Progress: Other (comment) Pt will go Supine/Side to Sit: with min assist PT Goal: Supine/Side to Sit - Progress: Other (comment) Pt will go Sit to Supine/Side: with mod assist PT Goal: Sit to Supine/Side - Progress: Other (comment) Pt will Transfer Bed to Chair/Chair to Bed: with mod assist PT Transfer Goal: Bed to Chair/Chair to Bed - Progress: Other (comment) Pt will Ambulate: 1 - 15 feet;with mod assist;with rolling walker PT Goal: Ambulate - Progress: Other (comment)  PT Evaluation Precautions/Restrictions    Prior Functioning  Home Living Lives With: Spouse;Other (Comment) Receives Help From: Family Type of Home: House Home Layout: One level;Able to live on main level with bedroom/bathroom Home Access: Level entry Bathroom Shower/Tub: Tub/shower unit;Curtain Bathroom Toilet: Standard Bathroom Accessibility: Yes How Accessible: Accessible via walker Home Adaptive Equipment: Walker - standard Prior Function Level of Independence: Independent with homemaking with wheelchair;Independent with gait;Independent with basic ADLs;Independent with homemaking with ambulation;Independent with transfers Driving: Yes Cognition Cognition Arousal/Alertness: Awake/alert Overall Cognitive Status: Appears within functional limits for tasks assessed Orientation  Level: Oriented X4 Sensation/Coordination Sensation Light Touch: Impaired by gross assessment Coordination  Gross Motor Movements are Fluid and Coordinated: No Fine Motor Movements are Fluid and Coordinated: Not tested Coordination and Movement Description: fluid with UE broken and forced with BIL LEs pt relying on momentum to move LEs Extremity Assessment RUE Assessment RUE Assessment: Within Functional Limits LUE Assessment LUE Assessment: Within Functional Limits RLE Assessment RLE Assessment: Exceptions to [facility] RLE AROM (degrees) Overall AROM Right Lower Extremity: Deficits;Due to premorbid status RLE Overall AROM Comments: limited flexion in all joints secondary to 4+ edema in blil LE  RLE PROM (degrees) Overall PROM Right Lower Extremity: Deficits;Due to premorbid status (Lymphedema) RLE Overall PROM Comments: See comment for AROM  RLE Strength RLE Overall Strength: Deficits;Due to premorbid status (Lymphedema) RLE Overall Strength Comments: pt unable to lift the wt of her LEs secondary to edema.   LLE Assessment LLE Assessment: Exceptions to [facility] (See comment for RLE) Mobility (including Balance) Bed Mobility Bed Mobility: Yes Rolling Left: 3: Mod assist;Patient percentage (comment);With rail (pt 50%) Rolling Left Details (indicate cue type and reason): Assist for Bil LE management  Left Sidelying to Sit: 2: Max assist;HOB elevated (comment degrees);Patient percentage (comment) (70 degrees pt 35%) Left Sidelying to Sit Details (indicate cue type and reason): Assist for L LE. Pt able to manage RLE and hips .   Supine to Sit:  (pt <20%) Sit to Supine - Left: HOB flat;1: +2 Total assist;Patient percentage (comment) (pt <20%) Sit to Supine - Left Details (indicate cue type and reason): Verbal cues for technique to lean to Left shoulder. Totatl Assist for bil LE on person for each LE secondary to excessive weight in LEs.  total assist to position hips secondary to fatigue.   (pt does  not tol lying in supine with head flat. ) Transfers Transfers: Yes Sit to Stand: 3: Mod assist;With upper extremity assist;From elevated surface;Patient percentage (comment);From bed (pt 60%) Sit to Stand Details (indicate cue type and reason): cues for hand placement on RW, bed in high position, assist for anterior wt shift and to support some of the pts wt to allow the pt to exend knees and hips.   Stand to Sit: 3: Mod assist;With upper extremity assist;To bed;Patient percentage (comment) (pt 70 % ) Stand to Sit Details: cues for safe technique, assist for controlled descent. second person assist to lower bed from high position prior to sitting to prevent pt from sliding forward off the bed.  Ambulation/Gait Ambulation/Gait: No Stairs: No Wheelchair Mobility Wheelchair Mobility: No  Posture/Postural Control Posture/Postural Control: No significant limitations Balance Balance Assessed: No Exercise    End of Session PT - End of Session Equipment Utilized During Treatment: Gait belt Activity Tolerance: Patient limited by fatigue Patient left: in bed;with bed alarm set Nurse Communication: Mobility status for transfers;Need for lift equipment General Behavior During Session: [facility] for tasks performed Cognition: [facility] for tasks performed  Janthony Holleman L. Emaan Gary DPT [id number] 11/14/2011, 2:44 PM

## 2011-11-14 NOTE — Progress Notes (Signed)
Progress note [facility] hospital[facility] for Dr. Clementeen Graham. Chief complaint. Dyspnea. History of present illness This middle-aged female with a complicated hospitalization including all leg cellulitis. Also mental status changes with a lumbar puncture pending results. She is on acyclovir empirically per infectious disease. She was found some renal issues and nephrology as consult them. Staff notes complaints of dyspnea and tachypnea in the 40s. I came to the floor to see her and requested the rapid response. I found the patient indeed with a respiratory rate of about 40 and shallow respirations. She complains of a note of chest pain but does confirm dyspnea. She states she is on no home oxygen or nebulizers. She states the dyspnea was of sudden onset. I did order Xopenex nebulizers which were given and the patient appears somewhat quite stable now with her respiratory rate down into the low 20s. Her oxygenation is in need 96% range on 2 L. We did do an arterial blood gas pH 7.335, PCO2 40.1, P. O2 was 116. Vital signs. Temperature 99.2, pulse 107, respirations initially 41 now at 22, blood pressure 190/107. Gen. appearance. This is an obese female initially with some respiratory distress now breathing easily status post nebulizer. Cardiac. Rhythm regular and the rate mildly tachycardic. There is mild jugular venous distention.. She has a significant lower extremity edema. No sacral edema noted. Lungs. Breath sounds are generally reduced in all fields. Breath sounds are however clear. I appreciate no crackles. Abdomen. Soft, obese, positive bowel sounds. No pain. Impression/plan #1 dyspnea. This appears resolved with nebulizer treatments. Her ABG appears benign. A stat chest x-ray ordered and is pending. I will also check a CBC with differential, B. natruretic peptide, comprehensive metabolic panel, a d-dimer. I note there had been a chest x-ray done 11/06/11 noting streaky perihilar and bibasilar opacities likely  atelectasis but bilateral pneumonia suspected. Infectious disease declined to add further antibiotics thinking that this was likely a resolving pneumonia. Will follow her for the other ordered results and change the treatment plan if this appears necessary.

## 2011-11-14 NOTE — Progress Notes (Signed)
Subjective: Patient seen and examined this am. Feels better, but c/o headaches. Low grade temp noted past 24hrs  Objective:  Vital signs in last 24 hours:  Filed Vitals:   11/14/11 0042 11/14/11 0540 11/14/11 0900 11/14/11 1300  BP: 171/105 163/97 151/91 146/81  Pulse: 89 87 84 96  Temp: 99.1 F (37.3 C) 99.1 F (37.3 C) 98.4 F (36.9 C) 99.7 F (37.6 C)  TempSrc: Oral Oral Oral Oral  Resp: _0 Height:      Weight:      SpO2: 95% 96% 100% 97%    Intake/Output from previous day:   Intake/Output Summary (Last 24 hours) at 11/14/11 1730 Last data filed at 11/14/11 1300  Gross per 24 hour  Intake 1611.4 ml  Output      0 ml  Net 1611.4 ml    Physical Exam:  General: middle aged obese female in not in any acute distress.  HEENT: no pallor, no icterus, moist oral mucosa CVS: S1-S2 clear, no murmur rubs or gallops  Chest: clear to auscultation bilaterally, no wheezing, rales or rhonchi  Abdomen: soft nontender, nondistended, normal bowel sounds, no organomegaly  Extremities: no cyanosis, clubbing, b/l chronic lymphadema Neuro: AAOX3 ranial nerves II-XII intact, no focal neurological deficits   Lab Results:  Basic Metabolic Panel:    Component Value Date/Time   NA 137 11/14/2011 0530   K 4.6 11/14/2011 0530   CL 107 11/14/2011 0530   CO2 24 11/14/2011 0530   BUN 26* 11/14/2011 0530   CREATININE 2.73* 11/14/2011 0530   GLUCOSE 113* 11/14/2011 0530   CALCIUM 8.1* 11/14/2011 0530   CBC:    Component Value Date/Time   WBC 4.4 11/14/2011 0530   HGB 9.9* 11/14/2011 0530   HCT 30.3* 11/14/2011 0530   PLT 287 11/14/2011 0530   MCV 84.6 11/14/2011 0530   NEUTROABS 3.3 11/11/2011 1600   LYMPHSABS 0.9 11/11/2011 1600   MONOABS 0.3 11/11/2011 1600   EOSABS 0.1 11/11/2011 1600   BASOSABS 0.0 11/11/2011 1600    Recent Results (from the past 240 hour(s))  URINE CULTURE     Status: Normal   Collection Time   11/05/11  4:13 PM      Component Value Range  Status Comment   Specimen Description URINE, CLEAN CATCH   Final    Special Requests NONE   Final    Setup Time [id number]   Final    Colony Count 8,000 COLONIES/ML   Final    Culture INSIGNIFICANT GROWTH   Final    Report Status 11/07/2011 FINAL   Final   CULTURE, BLOOD (ROUTINE X 2)     Status: Normal   Collection Time   11/06/11 10:40 AM      Component Value Range Status Comment   Specimen Description BLOOD PICC LINE DRAWN BY RN   Final    Special Requests BOTTLES DRAWN AEROBIC AND ANAEROBIC [place]   Final    Culture NO GROWTH 5 DAYS   Final    Report Status 11/11/2011 FINAL   Final   CULTURE, BLOOD (ROUTINE X 2)     Status: Normal   Collection Time   11/06/11 11:19 AM      Component Value Range Status Comment   Specimen Description BLOOD PICC LINE DRAWN BY RN   Final    Special Requests BOTTLES DRAWN AEROBIC AND ANAEROBIC 6CC   Final    Culture NO GROWTH 5 DAYS   Final    Report  Status 11/11/2011 FINAL   Final   CLOSTRIDIUM DIFFICILE BY PCR     Status: Normal   Collection Time   11/07/11  5:43 AM      Component Value Range Status Comment   C difficile by pcr NEGATIVE  NEGATIVE  Final   CULTURE, BLOOD (ROUTINE X 2)     Status: Normal   Collection Time   11/07/11  6:37 AM      Component Value Range Status Comment   Specimen Description BLOOD DRAWN BY RN PICC LINE   Final    Special Requests     Final    Value: BOTTLES DRAWN AEROBIC AND ANAEROBIC 6CC EACH BOTTLE   Culture NO GROWTH 5 DAYS   Final    Report Status 11/12/2011 FINAL   Final   MRSA PCR SCREENING     Status: Normal   Collection Time   11/11/11  2:39 PM      Component Value Range Status Comment   MRSA by PCR NEGATIVE  NEGATIVE  Final   URINE CULTURE     Status: Normal (Preliminary result)   Collection Time   11/12/11 11:00 PM      Component Value Range Status Comment   Specimen Description URINE, CLEAN CATCH   Final    Special Requests NONE   Final    Setup Time [id number]   Final    Colony Count PENDING    Incomplete    Culture Culture reincubated for better growth   Final    Report Status PENDING   Incomplete     Studies/Results: Ct Head Wo Contrast  11/13/2011  *RADIOLOGY REPORT*  Clinical Data: Fever of unknown origin.  Altered mental status.  CT HEAD WITHOUT CONTRAST  Technique:  Contiguous axial images were obtained from the base of the skull through the vertex without contrast.  Comparison: 03/02/2010.  Findings: Mild motion artifact slightly limits evaluation.  Poor delineation of the sylvian fissure and the anterior interhemispheric fissure.  Etiology/significance indeterminate. This appearance can be seen with subtle subarachnoid hemorrhage however, no evidence of subarachnoid blood within the suprasellar cistern.  No evidence of intraventricular blood. This appearance can be seen with increased cerebral edema however, the thalami and lenticular nucleus as well as gray-white  differentiation is stable and well defined.  No intracranial mass lesion detected on this unenhanced exam. Chiari one malformation with postop changes post sub occipital craniectomy.  No hydrocephalus.  Question partially empty sella?  This can be seen with pseudotumor cerebri.  Exophthalmos.  Meningitis not excluded by unenhanced CT.  If there are progressive symptoms, MR imaging may be considered for further delineation.  No CT evidence of large acute infarct.  Small acute infarct cannot be excluded by CT. No intracranial mass lesion detected on this unenhanced exam.  IMPRESSION: Poor delineation of the sylvian fissure and anterior interhemispheric fissure as discussed above.  Chiari one malformation with postop changes post sub occipital craniectomy.  Original Report Authenticated By: Doug Sou, M.D.   Dg Fluoro Guide Ndl Plc/bx  11/13/2011  *RADIOLOGY REPORT*  Clinical Data:  Headache, fever  LUMBAR PUNCTURE UNDER FLUOROSCOPY  Technique and findings: The procedure, risks (including but not limited to bleeding,  infection, organ damage), benefits, and alternatives were explained to the patient.  Questions regarding the procedure were encouraged and answered.  The patient understands and consents to the procedure. An appropriate skin entry site was determined fluoroscopically. Operator donned sterile gloves and mask. Skin site was marked, then prepped  with Betadine, draped in usual sterile fashion, and infiltrated locally with 1% lidocaine. A 6" 20 gauge spinal needle advanced into the thecal sac at L3 from a right parasagittal interlaminar approach. Clear colorless CSF spontaneously returned, with a corrected opening pressure of 45 cm water. 78m CSF were collected and divided among 4 sterile vials for the requested laboratory studies. The needle was then removed. No immediate complication.  Fluoroscropy Time: 11 seconds  IMPRESSION  1. Technically successful lumbar puncture under fluoroscopy.  Original Report Authenticated By: DTrecia Rogers M.D.    Medications: Scheduled Meds:   . acyclovir  10 mg/kg (Ideal) Intravenous Q12H  . amLODipine  10 mg Oral Daily  . feeding supplement  237 mL Oral TID WC  . feeding supplement  1 Container Oral BID BM  . Flora-Q  1 capsule Oral Daily  . insulin aspart  0-20 Units Subcutaneous TID WC  . insulin aspart  0-5 Units Subcutaneous QHS  . insulin aspart  5 Units Subcutaneous TID WC  . insulin glargine  10 Units Subcutaneous Daily  . metoCLOPramide (REGLAN) injection  5 mg Intravenous TID WC & HS  . metoprolol  5 mg Intravenous Q6H  . metoprolol tartrate  12.5 mg Oral BID  . ondansetron  4 mg Intravenous QID  . pantoprazole  40 mg Oral Q1200  . DISCONTD: pantoprazole (PROTONIX) IV  40 mg Intravenous Q12H   Continuous Infusions:   . sodium chloride 50 mL/hr at 11/14/11 0003   PRN Meds:.acetaminophen, acetaminophen, hydrALAZINE, ibuprofen, oxyCODONE-acetaminophen, phenol, senna-docusate, sodium chloride, zolpidem  ASSESSMENT  45year old female with  history of diabetes mellitus, coronary artery disease, chronic lymphedema, morbid obesity [facility]. Patient was treated with  IV  vancomycin and Zosyn and then subsequently discharged to home with IV Rocephin and vancomycin. [facility]. Blood cultures, urine cultures were all negative, C. difficile negative, CT of the chest was unremarkable except atelectasis. CT of the abdomen, pelvis showed some pelvic lymphadenopathy. [facility]   Plan:   *Altered mental status/FUO:  Mental status significantly improved now and  close to baseline  - Spinal tap under fluoroscopy done on 11/24  CSF study unremarkable so far except for high opening pressure as per IR . -follow final cx including cryptococcal ag - Unclear etiology at this time, possible multifactorial in etiology with differential including persistent fever, encephalopathy/meningitis (acute bacterial versus viral infection), started on IV acyclovir - Discussed in detail with neurology,CT head unremarkable. Neurology recommend neuropsych eval as outpt. No further recs - Patient will likely need an open MRI of the brain if workup above is negative..Mar[facility] KitchenMRI is not feasible in-house due to weight issues. Patient planned for  open MRI, unfortunately [facility] appears to be closed ( called today as well) and will call in am.  given high opening pressure ( 45 ) on LP, ID recommends checking for cryptococcal ag and possible repeat LP after MRI done. - Vasculitis workup  in progress.  -  chest x-ray on 11/11/2011 which showed streaky bilateral opacities questioning pneumonia, patient had the CT of the chest on 11/06/2011 which had shown bilateral atelectasis. [facility], hold off on any more antibiotics at this time as it could be residual/resolving pneumonia  or atelectasis.   LE cellulitis:   resolved, patient has significant lymphedema and morbid obesity.   Acute renal failure/dehydration  - Dehydration resolved  - vasculitis work up, ANA, ANCA, complement levels, CRP in progress. ESR elevated. continue to hold Demadex from the outpatient medications.  - continue IVF, renal ultrasound was done on 11/10/2011 which showed hepatomegaly with hepatic steatosis, slight increased echogenicity of the right renal parenchyma suggestive of renal medical disease.  -appreciate renal recs. Likely in the setting of infection vs AIN from meds. SPEP ordered  Monitor labs  in am  Anemia with nausea - FOBT positive, pt seen by GI at [facility] and they have started her on Reglan for nausea  - nausea and vomiting was thought to be secondary to gastroparesis, gastric emptying study pending on Monday.   Diabetes mellitus :  Uncontrolled, HbA1c 10.2 on 11/11/2011  - Blood sugars controlled inpatient, Continue sliding scale insulin and Lantus   Morbid obesity:  BMI over 40   Transaminitis  - Likely secondary to hepatic steatosis, hepatitis panel pending   Lymphedema of lower extremity  - severe and chronic, per pt's report now at her baseline   HTN (hypertension)  - continue when necessary medications, decrease IV fluids   HLD (hyperlipidemia)  - chronic, no statins secondary to tramsamnitis   Patient is full code   LOS: 10 days   [place], Derriana Oser 11/14/2011, 5:30 PM

## 2011-11-14 NOTE — Progress Notes (Signed)
S No complaints O:BP 151/91  Pulse 84  Temp(Src) 98.4 F (36.9 C) (Oral)  Resp 22  Ht _0  (1.651 m)  Wt 175.814 kg (387 lb 9.6 oz)  BMI 64.50 kg/m2  SpO2 100%  Intake/Output Summary (Last 24 hours) at 11/14/11 1141 Last data filed at 11/14/11 0900  Gross per 24 hour  Intake 1491.4 ml  Output      1 ml  Net 1490.4 ml   Weight change: -1.186 kg (-2 lb 9.8 oz) EXN:TZGYF and cooperative CVS:RRR Resp:clear but decreased VCB:SWHQP and nontender RFF:MBWGYKZ swelling and changes    . acyclovir  10 mg/kg (Ideal) Intravenous Q12H  . alteplase  2 mg Intracatheter Once  . amLODipine  10 mg Oral Daily  . cloNIDine  0.2 mg Oral Once  . feeding supplement  237 mL Oral TID WC  . feeding supplement  1 Container Oral BID BM  . Flora-Q  1 capsule Oral Daily  . insulin aspart  0-20 Units Subcutaneous TID WC  . insulin aspart  0-5 Units Subcutaneous QHS  . insulin aspart  5 Units Subcutaneous TID WC  . insulin glargine  10 Units Subcutaneous Daily  . metoCLOPramide (REGLAN) injection  5 mg Intravenous TID WC & HS  . metoprolol  5 mg Intravenous Q6H  . metoprolol tartrate  12.5 mg Oral BID  . ondansetron  4 mg Intravenous QID  . pantoprazole (PROTONIX) IV  40 mg Intravenous Q12H  . DISCONTD: alteplase  2 mg Intracatheter Once   Ct Head Wo Contrast  11/13/2011  *RADIOLOGY REPORT*  Clinical Data: Fever of unknown origin.  Altered mental status.  CT HEAD WITHOUT CONTRAST  Technique:  Contiguous axial images were obtained from the base of the skull through the vertex without contrast.  Comparison: 03/02/2010.  Findings: Mild motion artifact slightly limits evaluation.  Poor delineation of the sylvian fissure and the anterior interhemispheric fissure.  Etiology/significance indeterminate. This appearance can be seen with subtle subarachnoid hemorrhage however, no evidence of subarachnoid blood within the suprasellar cistern.  No evidence of intraventricular blood. This appearance can be seen  with increased cerebral edema however, the thalami and lenticular nucleus as well as gray-white  differentiation is stable and well defined.  No intracranial mass lesion detected on this unenhanced exam. Chiari one malformation with postop changes post sub occipital craniectomy.  No hydrocephalus.  Question partially empty sella?  This can be seen with pseudotumor cerebri.  Exophthalmos.  Meningitis not excluded by unenhanced CT.  If there are progressive symptoms, MR imaging may be considered for further delineation.  No CT evidence of large acute infarct.  Small acute infarct cannot be excluded by CT. No intracranial mass lesion detected on this unenhanced exam.  IMPRESSION: Poor delineation of the sylvian fissure and anterior interhemispheric fissure as discussed above.  Chiari one malformation with postop changes post sub occipital craniectomy.  Original Report Authenticated By: Doug Sou, M.D.   Dg Fluoro Guide Ndl Plc/bx  11/13/2011  *RADIOLOGY REPORT*  Clinical Data:  Headache, fever  LUMBAR PUNCTURE UNDER FLUOROSCOPY  Technique and findings: The procedure, risks (including but not limited to bleeding, infection, organ damage), benefits, and alternatives were explained to the patient.  Questions regarding the procedure were encouraged and answered.  The patient understands and consents to the procedure. An appropriate skin entry site was determined fluoroscopically. Operator donned sterile gloves and mask. Skin site was marked, then prepped with Betadine, draped in usual sterile fashion, and infiltrated locally with 1% lidocaine.  A 6" 20 gauge spinal needle advanced into the thecal sac at L3 from a right parasagittal interlaminar approach. Clear colorless CSF spontaneously returned, with a corrected opening pressure of 45 cm water. 78m CSF were collected and divided among 4 sterile vials for the requested laboratory studies. The needle was then removed. No immediate complication.  Fluoroscropy Time:  11 seconds  IMPRESSION  1. Technically successful lumbar puncture under fluoroscopy.  Original Report Authenticated By: DDillard CannonIII, M.D.   BMET  Lab 11/14/11 0530 11/12/11 0600 11/11/11 1600 11/11/11 0620 11/10/11 0602 11/09/11 0512 11/08/11 0551  NA 137 135 134* 136 134* 133* 133*  K 4.6 4.9 5.2* 4.8 5.4* 4.8 4.4  CL 107 104 104 109 103 101 100  CO2 _0 GLUCOSE 113* 113* 92 94 118* 160* 106*  BUN 26* 27* 27* 24* _1 CREATININE 2.73* 2.67* 2.58* 2.45* 2.40* 2.02* 1.70*  ALB -- -- -- -- -- -- --  CALCIUM 8.1* 8.1* 7.9* 7.6* 8.3* 8.2* 8.2*  PHOS 4.2 -- 3.9 -- -- -- --   CBC  Lab 11/14/11 0530 11/12/11 0600 11/11/11 1600 11/11/11 0620 11/10/11 0602  WBC 4.4 4.7 4.6 4.1 --  NEUTROABS -- -- 3.3 3.0 2.4  HGB 9.9* 9.5* 9.5* 8.8* --  HCT 30.3* 28.8* 28.8* 26.9* --  MCV 84.6 85.2 85.2 87.1 --  PLT 287 290 303 280 --     Assessment/Plan: Acute Renal Injury of uncertain etiology, probably cytokine mediated related to infection or perhaps AIN from meds Proteinuria due to Chronic Diabetic nephropathy  Supportive therapy, treat underlying infectious process. F/U SPEP   Trinidi Toppins C

## 2011-11-14 NOTE — Progress Notes (Addendum)
Subjective: Fatigued after PT  Objective: Weight change: -2 lb 9.8 oz (-1.186 kg)  Intake/Output Summary (Last 24 hours) at 11/14/11 1441 Last data filed at 11/14/11 1300  Gross per 24 hour  Intake 1611.4 ml  Output      1 ml  Net 1610.4 ml   Blood pressure 151/91, pulse 84, temperature 98.4 F (36.9 C), temperature source Oral, resp. rate 22, height _0  (1.651 m), weight 387 lb 9.6 oz (175.814 kg), SpO2 100.00%. Temp:  [98.4 F (36.9 C)-100 F (37.8 C)] 98.4 F (36.9 C) (11/25 0900) Pulse Rate:  [81-116] 84  (11/25 0900) Resp:  [22-35] 22  (11/25 0900) BP: (124-192)/(66-105) 151/91 mmHg (11/25 0900) SpO2:  [95 %-100 %] 100 % (11/25 0900) Weight:  [387 lb 9.6 oz (175.814 kg)] 387 lb 9.6 oz (175.814 kg) (11/24 2051)  Physical Exam: General: Alert and awake, oriented x3, not in any acute distress. HEENT: anicteric sclera, pupils reactive to light and accommodation, EOMI CVS regular rate, normal r,  no murmur rubs or gallops Chest: clear to auscultation bilaterally, no wheezing, rales or rhonchi Abdomen: soft nontender, nondistended, normal bowel sounds, Extremities: severe lymphedema but no obvious cellulitis  Neuro: nonfocal  Lab Results:  Basename 11/14/11 0530 11/12/11 0600  WBC 4.4 4.7  HGB 9.9* 9.5*  HCT 30.3* 28.8*  PLT 287 290   BMET  Basename 11/14/11 0530 11/12/11 0600  NA 137 135  K 4.6 4.9  CL 107 104  CO2 24 21  GLUCOSE 113* 113*  BUN 26* 27*  CREATININE 2.73* 2.67*  CALCIUM 8.1* 8.1*    Micro Results: Recent Results (from the past 240 hour(s))  URINE CULTURE     Status: Normal   Collection Time   11/05/11  4:13 PM      Component Value Range Status Comment   Specimen Description URINE, CLEAN CATCH   Final    Special Requests NONE   Final    Setup Time [id number]   Final    Colony Count 8,000 COLONIES/ML   Final    Culture INSIGNIFICANT GROWTH   Final    Report Status 11/07/2011 FINAL   Final   CULTURE, BLOOD (ROUTINE X 2)     Status:  Normal   Collection Time   11/06/11 10:40 AM      Component Value Range Status Comment   Specimen Description BLOOD PICC LINE DRAWN BY RN   Final    Special Requests BOTTLES DRAWN AEROBIC AND ANAEROBIC [place]   Final    Culture NO GROWTH 5 DAYS   Final    Report Status 11/11/2011 FINAL   Final   CULTURE, BLOOD (ROUTINE X 2)     Status: Normal   Collection Time   11/06/11 11:19 AM      Component Value Range Status Comment   Specimen Description BLOOD PICC LINE DRAWN BY RN   Final    Special Requests BOTTLES DRAWN AEROBIC AND ANAEROBIC [place]   Final    Culture NO GROWTH 5 DAYS   Final    Report Status 11/11/2011 FINAL   Final   CLOSTRIDIUM DIFFICILE BY PCR     Status: Normal   Collection Time   11/07/11  5:43 AM      Component Value Range Status Comment   C difficile by pcr NEGATIVE  NEGATIVE  Final   CULTURE, BLOOD (ROUTINE X 2)     Status: Normal   Collection Time   11/07/11  6:37 AM  Component Value Range Status Comment   Specimen Description BLOOD DRAWN BY RN PICC LINE   Final    Special Requests     Final    Value: BOTTLES DRAWN AEROBIC AND ANAEROBIC 6CC EACH BOTTLE   Culture NO GROWTH 5 DAYS   Final    Report Status 11/12/2011 FINAL   Final   MRSA PCR SCREENING     Status: Normal   Collection Time   11/11/11  2:39 PM      Component Value Range Status Comment   MRSA by PCR NEGATIVE  NEGATIVE  Final   URINE CULTURE     Status: Normal (Preliminary result)   Collection Time   11/12/11 11:00 PM      Component Value Range Status Comment   Specimen Description URINE, CLEAN CATCH   Final    Special Requests NONE   Final    Setup Time [id number]   Final    Colony Count PENDING   Incomplete    Culture Culture reincubated for better growth   Final    Report Status PENDING   Incomplete     Studies/Results: Ct Abdomen Pelvis Wo Contrast  11/06/2011  *RADIOLOGY REPORT*  Clinical Data:  Fever of unknown origin, nausea/vomiting  CT CHEST, ABDOMEN AND PELVIS WITHOUT CONTRAST   Technique:  Multidetector CT imaging of the chest, abdomen and pelvis was performed following the standard protocol without IV contrast.  Comparison:  Chest radiographs dated 11/04/2011  CT CHEST  Findings:  Motion degraded images.  Mild dependent bilateral lower lobe opacities, likely atelectasis. No pleural effusion or pneumothorax.  Status post right thyroidectomy.  Left thyroid is unremarkable.  Mild cardiomegaly.  No pericardial effusion.  Small prevascular lymph nodes.  No suspicious axillary lymphadenopathy.  Right subclavian PICC terminating in the lower SVC.  Mild degenerative changes of the thoracic spine.  IMPRESSION: Motion degraded images.  Suspected bilateral lower lobe atelectasis.  Mild cardiomegaly.  CT ABDOMEN AND PELVIS  Findings:  Evaluation is constrained by artifact related to body habitus and lack of intravenous contrast.  Liver, spleen, pancreas, and adrenal glands are grossly unremarkable.  Gallbladder is grossly unremarkable.  No definite inflammatory changes by CT.  No.  Kidneys are unremarkable.  No renal calculi or hydronephrosis.  No evidence of bowel obstruction.  Colonic diverticulosis, without associated inflammatory changes.  No evidence of abdominal aortic aneurysm.  Retroperitoneal/left pelvic lymphadenopathy, including:  --2.5 x 1.9 cm left para-aortic node (series 2/image 65) --2.4 x 2.0 cm left common iliac node (series 2/image 85) --3.2 x 1.6 cm left external iliac node (series 2/image 107)  No abdominopelvic ascites.  No evidence of intra-abdominal abscess.  Fat-containing umbilical hernia (series 2/image 95).  Status post hysterectomy.  No adnexal masses.  Bladder is unremarkable.  Soft tissue stranding in the left lateral abdominal wall (series 2/image 82), incompletely visualized.  Visualized osseous structures are within normal limits.  IMPRESSION: Evaluation is constrained by artifact related to body habitus and lack of intravenous contrast.  No evidence of  intra-abdominal abscess.  Retroperitoneal/left pelvic lymphadenopathy, as described above.  Soft tissue stranding in the left lateral abdominal wall, incompletely visualized.  Original Report Authenticated By: Julian Hy, M.D.   Dg Chest 2 View  11/11/2011  *RADIOLOGY REPORT*  Clinical Data: 45 year old female with shortness of breath, weakness, fever of unknown origin.  CHEST - 2 VIEW  Comparison: Chest CT 11/06/2011 and earlier.  Findings: AP and lateral views of the chest demonstrate coarse perihilar pulmonary opacity extending  to both lung bases.  This appears beyond that expected for atelectasis.  Right side PICC line.  Cardiomegaly. Visualized tracheal air column is within normal limits.  No pneumothorax or pulmonary edema evident.  IMPRESSION: Streaky perihilar and bibasilar opacity beyond that expected for atelectasis.  Bilateral pneumonia suspected.  Original Report Authenticated By: Randall An, M.D.   Dg Chest 2 View  10/28/2011  *RADIOLOGY REPORT*  Clinical Data: PICC line placement  CHEST - 2 VIEW  Comparison: 10/28/2011  Findings: Left arm PICC line again identified, extending right lateral to mediastinum on PA view. On the lateral view, this appears to be anteriorly directed, likely within a mediastinal draining vein. This should be repositioned. Cardiac silhouette appears enlarged. Mediastinal contours and pulmonary vascularity grossly normal. Linear subsegmental atelectasis or scarring left lower lobe. Lungs otherwise clear.  IMPRESSION: Left arm PICC line tip appears external to the mediastinum, suspect within and mediastinal draining vein. Recommend repositioning or replacement.  Original Report Authenticated By: Burnetta Sabin, M.D.   Ct Head Wo Contrast  11/13/2011  *RADIOLOGY REPORT*  Clinical Data: Fever of unknown origin.  Altered mental status.  CT HEAD WITHOUT CONTRAST  Technique:  Contiguous axial images were obtained from the base of the skull through the vertex without  contrast.  Comparison: 03/02/2010.  Findings: Mild motion artifact slightly limits evaluation.  Poor delineation of the sylvian fissure and the anterior interhemispheric fissure.  Etiology/significance indeterminate. This appearance can be seen with subtle subarachnoid hemorrhage however, no evidence of subarachnoid blood within the suprasellar cistern.  No evidence of intraventricular blood. This appearance can be seen with increased cerebral edema however, the thalami and lenticular nucleus as well as gray-white  differentiation is stable and well defined.  No intracranial mass lesion detected on this unenhanced exam. Chiari one malformation with postop changes post sub occipital craniectomy.  No hydrocephalus.  Question partially empty sella?  This can be seen with pseudotumor cerebri.  Exophthalmos.  Meningitis not excluded by unenhanced CT.  If there are progressive symptoms, MR imaging may be considered for further delineation.  No CT evidence of large acute infarct.  Small acute infarct cannot be excluded by CT. No intracranial mass lesion detected on this unenhanced exam.  IMPRESSION: Poor delineation of the sylvian fissure and anterior interhemispheric fissure as discussed above.  Chiari one malformation with postop changes post sub occipital craniectomy.  Original Report Authenticated By: Doug Sou, M.D.   Ct Chest Wo Contrast  11/06/2011  *RADIOLOGY REPORT*  Clinical Data:  Fever of unknown origin, nausea/vomiting  CT CHEST, ABDOMEN AND PELVIS WITHOUT CONTRAST  Technique:  Multidetector CT imaging of the chest, abdomen and pelvis was performed following the standard protocol without IV contrast.  Comparison:  Chest radiographs dated 11/04/2011  CT CHEST  Findings:  Motion degraded images.  Mild dependent bilateral lower lobe opacities, likely atelectasis. No pleural effusion or pneumothorax.  Status post right thyroidectomy.  Left thyroid is unremarkable.  Mild cardiomegaly.  No pericardial  effusion.  Small prevascular lymph nodes.  No suspicious axillary lymphadenopathy.  Right subclavian PICC terminating in the lower SVC.  Mild degenerative changes of the thoracic spine.  IMPRESSION: Motion degraded images.  Suspected bilateral lower lobe atelectasis.  Mild cardiomegaly.  CT ABDOMEN AND PELVIS  Findings:  Evaluation is constrained by artifact related to body habitus and lack of intravenous contrast.  Liver, spleen, pancreas, and adrenal glands are grossly unremarkable.  Gallbladder is grossly unremarkable.  No definite inflammatory changes by CT.  No.  Kidneys are unremarkable.  No renal calculi or hydronephrosis.  No evidence of bowel obstruction.  Colonic diverticulosis, without associated inflammatory changes.  No evidence of abdominal aortic aneurysm.  Retroperitoneal/left pelvic lymphadenopathy, including:  --2.5 x 1.9 cm left para-aortic node (series 2/image 65) --2.4 x 2.0 cm left common iliac node (series 2/image 85) --3.2 x 1.6 cm left external iliac node (series 2/image 107)  No abdominopelvic ascites.  No evidence of intra-abdominal abscess.  Fat-containing umbilical hernia (series 2/image 95).  Status post hysterectomy.  No adnexal masses.  Bladder is unremarkable.  Soft tissue stranding in the left lateral abdominal wall (series 2/image 82), incompletely visualized.  Visualized osseous structures are within normal limits.  IMPRESSION: Evaluation is constrained by artifact related to body habitus and lack of intravenous contrast.  No evidence of intra-abdominal abscess.  Retroperitoneal/left pelvic lymphadenopathy, as described above.  Soft tissue stranding in the left lateral abdominal wall, incompletely visualized.  Original Report Authenticated By: Julian Hy, M.D.   US Abdomen Complete  11/10/2011  *RADIOLOGY REPORT*  Clinical Data:  Elevated liver function tests.  Nausea vomiting. Diabetes.  COMPLETE ABDOMINAL ULTRASOUND  Comparison:  CT scan dated 11/06/2011  Findings:   Gallbladder:  No gallstones, gallbladder wall thickening, or pericholecystic fluid.Negative sonographic Murphy's sign.  Common bile duct:  Normal.  4.9 mm in diameter.  Liver:  Hepatomegaly with diffuse increased echogenicity consistent with hepatic steatosis.  No focal lesions.  No biliary ductal dilatation.  IVC:  Normal.  Pancreas:  Normal.  Spleen:  Normal.  9.9 cm in length.  Right Kidney:  15.6 cm in length.  Increased echogenicity of the renal parenchyma suggesting renal medical disease.  No hydronephrosis.  Left Kidney:  11.8 cm in length.  No hydronephrosis or mass. Detailed evaluation of the left kidney is suboptimal.  Abdominal aorta:  Maximal diameter 2.4 cm.  IMPRESSION: Hepatomegaly with hepatic steatosis. Slight increased echogenicity of the right renal parenchyma suggestive of renal medical disease. The left renal parenchyma is not well seen on this ultrasound but the kidney was not atrophic on the CT scan of 11/06/2011.  Original Report Authenticated By: Larey Seat, M.D.   Ir US Guide Vasc Access Right  10/29/2011  *RADIOLOGY REPORT*  Clinical Data: Cellulitis and needs IV access.  PICC LINE PLACEMENT WITH ULTRASOUND AND FLUOROSCOPIC  GUIDANCE  Fluoroscopy Time: 0.3 minutes.  The right arm was prepped with chlorhexidine, draped in the usual sterile fashion using maximum barrier technique (cap and mask, sterile gown, sterile gloves, large sterile sheet, hand hygiene and cutaneous antisepsis) and infiltrated locally with 1% Lidocaine.  Ultrasound demonstrated patency of the right basilic vein, and this was documented with an image.  Under real-time ultrasound guidance, this vein was accessed with a 21 gauge micropuncture needle and image documentation was performed.  The needle was exchanged over a guidewire for a peel-away sheath through which a five [place] dual lumen PICC trimmed to 41 cm was advanced, positioned with its tip at the lower SVC.  Fluoroscopy during the procedure and fluoro  spot radiograph confirms appropriate catheter position.  The catheter was flushed, secured to the skin with Prolene sutures, and covered with a sterile dressing.  Complications:  None  IMPRESSION: Successful right arm PICC line placement with ultrasound and fluoroscopic guidance.  The catheter is ready for use.  Original Report Authenticated By: Markus Daft, M.D.   Dg Chest Portable 1 View  11/04/2011  *RADIOLOGY REPORT*  Clinical Data: Cough, shortness of breath, fever  PORTABLE CHEST - 1 VIEW  Comparison: Chest x-ray of 10/28/2011  Findings: A right upper extremity PICC line is present with the tip seen to the lower SVC.  No pneumothorax is noted.  Cardiomegaly is stable.  No bony abnormality is seen other than degenerative change throughout the thoracic spine.  IMPRESSION: Right PICC line tip in lower SVC.  No pneumothorax.  Original Report Authenticated By: Joretta Bachelor, M.D.   Dg Chest Portable 1 View  10/28/2011  *RADIOLOGY REPORT*  Clinical Data: Left PICC line placement.  PORTABLE CHEST - 1 VIEW  Comparison: 10/26/2011  Findings: Left PICC line is in place.  The tip projects over the medial right lung and appears to be beyond the SVC margin.  I cannot exclude that this is extravascular.  Mild cardiomegaly.  Bibasilar atelectasis.  No effusions.  IMPRESSION: Left PICC line tip projects in beyond the SVC margin over the medial right lung.  Cannot exclude this is extravascular.  Consider replacement.  Original Report Authenticated By: Raelyn Number, M.D.   Dg Chest Portable 1 View  10/26/2011  *RADIOLOGY REPORT*  Clinical Data: Fever.  Cough.  PORTABLE CHEST - 1 VIEW  Comparison: 05/27/2010.  Findings: Cardiomegaly.  Central pulmonary vascular prominence.  No segmental infiltrate.  Limited evaluation of the mediastinum by technique.  The patient would eventually benefit from follow-up two-view chest with cardiac leads removed.  No gross pneumothorax.  IMPRESSION: Cardiomegaly with central pulmonary  vascular prominence.  Please see above.  Original Report Authenticated By: Doug Sou, M.D.   Dg Fluoro Guide Ndl Plc/bx  11/13/2011  *RADIOLOGY REPORT*  Clinical Data:  Headache, fever  LUMBAR PUNCTURE UNDER FLUOROSCOPY  Technique and findings: The procedure, risks (including but not limited to bleeding, infection, organ damage), benefits, and alternatives were explained to the patient.  Questions regarding the procedure were encouraged and answered.  The patient understands and consents to the procedure. An appropriate skin entry site was determined fluoroscopically. Operator donned sterile gloves and mask. Skin site was marked, then prepped with Betadine, draped in usual sterile fashion, and infiltrated locally with 1% lidocaine. A 6" 20 gauge spinal needle advanced into the thecal sac at L3 from a right parasagittal interlaminar approach. Clear colorless CSF spontaneously returned, with a corrected opening pressure of 45 cm water. 80m CSF were collected and divided among 4 sterile vials for the requested laboratory studies. The needle was then removed. No immediate complication.  Fluoroscropy Time: 11 seconds  IMPRESSION  1. Technically successful lumbar puncture under fluoroscopy.  Original Report Authenticated By: DTrecia Rogers M.D.   Ir Central Line Left  10/29/2011  *RADIOLOGY REPORT*  Clinical Data: Cellulitis and needs IV access.  PICC LINE PLACEMENT WITH ULTRASOUND AND FLUOROSCOPIC  GUIDANCE  Fluoroscopy Time: 0.3 minutes.  The right arm was prepped with chlorhexidine, draped in the usual sterile fashion using maximum barrier technique (cap and mask, sterile gown, sterile gloves, large sterile sheet, hand hygiene and cutaneous antisepsis) and infiltrated locally with 1% Lidocaine.  Ultrasound demonstrated patency of the right basilic vein, and this was documented with an image.  Under real-time ultrasound guidance, this vein was accessed with a 21 gauge micropuncture needle and image  documentation was performed.  The needle was exchanged over a guidewire for a peel-away sheath through which a five [place] lumen PICC trimmed to 41 cm was advanced, positioned with its tip at the lower SVC.  Fluoroscopy during the procedure and fluoro spot radiograph confirms appropriate catheter  position.  The catheter was flushed, secured to the skin with Prolene sutures, and covered with a sterile dressing.  Complications:  None  IMPRESSION: Successful right arm PICC line placement with ultrasound and fluoroscopic guidance.  The catheter is ready for use.  Original Report Authenticated By: Markus Daft, M.D.    Antibiotics:  Anti-infectives     Start     Dose/Rate Route Frequency Ordered Stop   11/13/11 1000   acyclovir (ZOVIRAX) 570 mg in dextrose 5 % 100 mL IVPB        10 mg/kg  57 kg (Ideal) 111.4 mL/hr over 60 Minutes Intravenous Every 12 hours 11/13/11 0939     11/11/11 2359   ciprofloxacin (CIPRO) IVPB 400 mg  Status:  Discontinued        400 mg 200 mL/hr over 60 Minutes Intravenous Every 12 hours 11/11/11 2157 11/12/11 1519   11/11/11 2300   cefTAZidime (FORTAZ) 1 g in dextrose 5 % 50 mL IVPB  Status:  Discontinued        1 g 100 mL/hr over 30 Minutes Intravenous 3 times per day 11/11/11 2157 11/12/11 1519   11/11/11 1800   acyclovir (ZOVIRAX) 570 mg in dextrose 5 % 100 mL IVPB  Status:  Discontinued        10 mg/kg  57 kg (Ideal) 111.4 mL/hr over 60 Minutes Intravenous Every 8 hours 11/11/11 1726 11/13/11 0939   11/11/11 1645   acyclovir (ZOVIRAX) 1,720 mg in dextrose 5 % 250 mL IVPB  Status:  Discontinued        10 mg/kg  171.9 kg 284.4 mL/hr over 60 Minutes Intravenous 3 times per day 11/11/11 1643 11/11/11 1722   11/11/11 1200   linezolid (ZYVOX) IVPB 600 mg  Status:  Discontinued        600 mg 300 mL/hr over 60 Minutes Intravenous Every 12 hours 11/11/11 0905 11/11/11 1643   11/11/11 1100   imipenem-cilastatin (PRIMAXIN) 500 mg in sodium chloride 0.9 % 100 mL IVPB   Status:  Discontinued        500 mg 200 mL/hr over 30 Minutes Intravenous Every 8 hours 11/11/11 0900 11/11/11 1643   11/11/11 1000   linezolid (ZYVOX) IVPB 600 mg  Status:  Discontinued        600 mg 300 mL/hr over 60 Minutes Intravenous Every 12 hours 11/11/11 0857 11/11/11 0902   11/11/11 0915   linezolid (ZYVOX) IVPB 600 mg  Status:  Discontinued        600 mg 300 mL/hr over 60 Minutes Intravenous Every 12 hours 11/11/11 0903 11/11/11 0904   11/10/11 1745   fluconazole (DIFLUCAN) tablet 100 mg  Status:  Discontinued        100 mg Oral Daily 11/10/11 1742 11/12/11 1521   11/09/11 2100   tobramycin (NEBCIN) 620 mg in dextrose 5 % 100 mL IVPB  Status:  Discontinued        7 mg/kg  88.5 kg (Order-Specific) 115.5 mL/hr over 60 Minutes Intravenous Every 48 hours 11/08/11 1018 11/10/11 0948   11/09/11 1000   vancomycin (VANCOCIN) 1,500 mg in sodium chloride 0.9 % 500 mL IVPB  Status:  Discontinued        1,500 mg 250 mL/hr over 120 Minutes Intravenous Every 24 hours 11/08/11 1015 11/10/11 0819   11/07/11 2300   tobramycin (NEBCIN) 620 mg in dextrose 5 % 100 mL IVPB  Status:  Discontinued        7 mg/kg  88.5 kg (Order-Specific)  115.5 mL/hr over 60 Minutes Intravenous Every 36 hours 11/07/11 0757 11/08/11 1018   11/06/11 2100   vancomycin (VANCOCIN) IVPB 1000 mg/200 mL premix  Status:  Discontinued        1,000 mg 200 mL/hr over 60 Minutes Intravenous Every 12 hours 11/06/11 0841 11/08/11 0937   11/06/11 1230   tobramycin (NEBCIN) 620 mg in dextrose 5 % 100 mL IVPB  Status:  Discontinued        7 mg/kg  88.5 kg (Order-Specific) 115.5 mL/hr over 60 Minutes Intravenous Every 24 hours 11/06/11 1133 11/07/11 0757   11/05/11 1000   ciprofloxacin (CIPRO) IVPB 400 mg  Status:  Discontinued        400 mg 200 mL/hr over 60 Minutes Intravenous Every 12 hours 11/05/11 0938 11/11/11 1643   11/04/11 2200   vancomycin (VANCOCIN) IVPB 1000 mg/200 mL premix  Status:  Discontinued        1,000  mg 200 mL/hr over 60 Minutes Intravenous Every 8 hours 11/04/11 1914 11/06/11 0835   11/04/11 2000   cefTRIAXone (ROCEPHIN) 1 g in dextrose 5 % 50 mL IVPB  Status:  Discontinued        1 g 100 mL/hr over 30 Minutes Intravenous Every 24 hours 11/04/11 1846 11/10/11 0819          Medications: Scheduled Meds:    . acyclovir  10 mg/kg (Ideal) Intravenous Q12H  . amLODipine  10 mg Oral Daily  . cloNIDine  0.2 mg Oral Once  . feeding supplement  237 mL Oral TID WC  . feeding supplement  1 Container Oral BID BM  . Flora-Q  1 capsule Oral Daily  . insulin aspart  0-20 Units Subcutaneous TID WC  . insulin aspart  0-5 Units Subcutaneous QHS  . insulin aspart  5 Units Subcutaneous TID WC  . insulin glargine  10 Units Subcutaneous Daily  . metoCLOPramide (REGLAN) injection  5 mg Intravenous TID WC & HS  . metoprolol  5 mg Intravenous Q6H  . metoprolol tartrate  12.5 mg Oral BID  . ondansetron  4 mg Intravenous QID  . pantoprazole (PROTONIX) IV  40 mg Intravenous Q12H   Continuous Infusions:    . sodium chloride 50 mL/hr at 11/14/11 0003   PRN Meds:.acetaminophen, acetaminophen, hydrALAZINE, ibuprofen, oxyCODONE-acetaminophen, phenol, senna-docusate, sodium chloride, zolpidem  Assessment/Plan: [name] is a 45 y.o. female with With diabetes mellitus and coronary artery disease and chronic lymphedema who was admitted to [facility] with cellulitis involving her left lower extremity. She was treated with intravenous antibiotics vancomycin and Zosyn while at any [facility] was discharged to home with IV ceftriaxone and vancomycin. While, she felt well but approximately 3 days after discharge she developed fevers malaise nausea and vomiting. She was readmitted to [facility] and started on Achromycin Zosyn. She continued to have fevers and ciprofloxacin was added as was tobramycin. Despite all these various antibacterial antibiotics the patient continued to have high-grade fevers as  well as intermittent confusion and largely correlated with her fevers without any pain she had a CT of the chest that was unrevealing a CT of the abdomen and pelvis that did show some pelvic lymphadenopathy. There is no obvious intra-abdominal abscess found on exam. Blood cultures and urine cultures were all negative for C. difficile by PCR was negative . She was ultimately transferred over to [facility] for further evaluation and treatment. Her CXR here was read as showing findings concerning for pna> atelectasis (though she  has been on very very broad spectrum antibiotics in interim with no other films for past 7 days) Her PCT is normal. She was started on acylovir and restarted on antibacterial antibiotics by [facility] on admission when LP was delayed. I stopped her antibacterials since then. LP  is completely benign. She is actually better today. MRI of brain not possible in house due to wight problems. Labs so far of note, ESR is >120, RF positive.  FUO:  - would withold antibacterials at present --followup on host of labs, her elevated ESR, RF do point to some significant inflammatory process, autoimmune or otherwise  Encephalopathy: I am skeptical for HSV encephalitis and now PCR for HSV will be less sensitive on acyclovir, Patient needs MRI brain at open mri center Monday or as soon as possible  ?PNA: --She may have aspirated during her stay at [facility] . I am not overwhelmed by the CXR findings and we don not know what these findings mean in context over several weeks of broad spectrum antibiotics and interval of 7 days wihout interval imaging --should her fevers return and clinical status suggest PNA, I would get a Ct chest again to better elucidate lungs. I am very skeptical of pneumonia that is active at this time   PPI use: It is not clear to me why this patient needs IV PPI twice daily. There are very few indications that require IV PPI and they are confined to active bleedign. I would  recommend stop the high dose IV PPi as it puts this patient at high risk for c difficile colitis    LOS: 10 days   Alcide Evener 11/14/2011, 2:41 PM  ADDENDUM: DR. HASSELL'S NOTES INDICATE THAT THE PATIENT HAD AN OPENING PRESSURE OF 45CM OF H20! I am trying to get ahold of him to see if this is accurate. Again CSF composition is NOT suggestive of infection. But this is a very high openign pressure. Will try to add cryptococcal antigen to CSF. I would also push to have MRI done and consider Repeat LP to recheck openign pressure

## 2011-11-15 ENCOUNTER — Inpatient Hospital Stay (HOSPITAL_COMMUNITY): Payer: PRIVATE HEALTH INSURANCE

## 2011-11-15 ENCOUNTER — Ambulatory Visit (HOSPITAL_COMMUNITY): Payer: PRIVATE HEALTH INSURANCE | Admitting: Physical Therapy

## 2011-11-15 LAB — CBC
Hemoglobin: 10.2 g/dL — ABNORMAL LOW (ref 12.0–15.0)
Hemoglobin: 11.2 g/dL — ABNORMAL LOW (ref 12.0–15.0)
MCH: 27.3 pg (ref 26.0–34.0)
MCH: 27.9 pg (ref 26.0–34.0)
MCHC: 32.3 g/dL (ref 30.0–36.0)
MCHC: 33.4 g/dL (ref 30.0–36.0)
MCV: 83.5 fL (ref 78.0–100.0)

## 2011-11-15 LAB — ANGIOTENSIN CONVERTING ENZYME: Angiotensin-Converting Enzyme: 53 U/L — ABNORMAL HIGH (ref 8–52)

## 2011-11-15 LAB — HEPATITIS PANEL, ACUTE
HCV Ab: NEGATIVE
Hep A IgM: NEGATIVE
Hep B C IgM: NEGATIVE

## 2011-11-15 LAB — CARDIAC PANEL(CRET KIN+CKTOT+MB+TROPI)
Relative Index: INVALID (ref 0.0–2.5)
Relative Index: INVALID (ref 0.0–2.5)
Total CK: 34 U/L (ref 7–177)
Total CK: 39 U/L (ref 7–177)
Troponin I: <0.3 ng/mL (ref <0.30)
Troponin I: <0.3 ng/mL (ref <0.30)

## 2011-11-15 LAB — RENAL FUNCTION PANEL
BUN: 24 mg/dL — ABNORMAL HIGH (ref 6–23)
Calcium: 7.8 mg/dL — ABNORMAL LOW (ref 8.4–10.5)
Creatinine, Ser: 2.66 mg/dL — ABNORMAL HIGH (ref 0.50–1.10)
Glucose, Bld: 130 mg/dL — ABNORMAL HIGH (ref 70–99)
Phosphorus: 4 mg/dL (ref 2.3–4.6)

## 2011-11-15 LAB — ANTI-NUCLEAR AB-TITER (ANA TITER): ANA Titer 1: 1:40 {titer} — ABNORMAL HIGH

## 2011-11-15 LAB — HERPES SIMPLEX VIRUS(HSV) DNA BY PCR: HSV 1 DNA: NOT DETECTED

## 2011-11-15 LAB — GLUCOSE, CAPILLARY
Glucose-Capillary: 131 mg/dL — ABNORMAL HIGH (ref 70–99)
Glucose-Capillary: 131 mg/dL — ABNORMAL HIGH (ref 70–99)

## 2011-11-15 LAB — EPSTEIN-BARR VIRUS VCA ANTIBODY PANEL
EBV EA IgG: 0.94 {ISR} — ABNORMAL HIGH
EBV NA IgG: 0.53 {ISR}
EBV VCA IgM: 0.52 {ISR}

## 2011-11-15 MED ORDER — TECHNETIUM TO 99M ALBUMIN AGGREGATED
6.0000 | Freq: Once | INTRAVENOUS | Status: AC | PRN
  Administered 2011-11-15: 6 via INTRAVENOUS

## 2011-11-15 MED ORDER — XENON XE 133 GAS
10.0000 | GAS_FOR_INHALATION | Freq: Once | RESPIRATORY_TRACT | Status: AC | PRN
Start: 2011-11-15 — End: 2011-11-15
  Administered 2011-11-15: 10 via RESPIRATORY_TRACT

## 2011-11-15 MED ORDER — HYDRALAZINE HCL 20 MG/ML IJ SOLN
10.0000 mg | Freq: Once | INTRAMUSCULAR | Status: AC
  Administered 2011-11-15: 10 mg via INTRAVENOUS
  Filled 2011-11-15 (×2): qty 0.5

## 2011-11-15 MED ORDER — FUROSEMIDE 10 MG/ML IJ SOLN
40.0000 mg | Freq: Once | INTRAMUSCULAR | Status: AC
  Administered 2011-11-15: 40 mg via INTRAVENOUS
  Filled 2011-11-15: qty 4

## 2011-11-15 MED ORDER — HEPARIN (PORCINE) IN NACL 100-0.45 UNIT/ML-% IJ SOLN
1900.0000 [IU]/h | INTRAMUSCULAR | Status: DC
  Administered 2011-11-15 (×2): 2000 [IU]/h via INTRAVENOUS
  Filled 2011-11-15 (×2): qty 250

## 2011-11-15 MED ORDER — HEPARIN BOLUS VIA INFUSION
5000.0000 [IU] | Freq: Once | INTRAVENOUS | Status: AC
  Administered 2011-11-15: 5000 [IU] via INTRAVENOUS
  Filled 2011-11-15: qty 5000

## 2011-11-15 NOTE — Progress Notes (Signed)
Bilateral lower extremity completed. Severely technically limited due to morbid obesity and lymphedema. Unable to image the majority of the leg. No obvious DVT or superficial thrombus in areas of vein imaged. Exam somewhat inconclusive.  [place], [place] D 11/15/2011, 5:27 PM

## 2011-11-15 NOTE — Progress Notes (Signed)
PT/OT/SLP Cancellation Note  Treatment cancelled today due to patient receiving procedure or test Nuclear medicine  Ariel Dimitri L. Chavie Kolinski DPT [phone]

## 2011-11-15 NOTE — Progress Notes (Signed)
Utilization Review Completed.  Markita Stcharles, [place] T  11/15/2011

## 2011-11-15 NOTE — Progress Notes (Signed)
ANTICOAGULATION CONSULT NOTE - Follow Up Consult  Pharmacy Consult for heparin Indication: r/o PE  Allergies  Allergen Reactions  . Contrast Media (Iodinated Diagnostic Agents) Swelling    Dye for cardiac cath.  . Pneumococcal Vaccines     Patient Measurements: Height: _0  (165.1 cm) Weight: 389 lb 3.2 oz (176.54 kg) IBW/kg (Calculated) : 57  Adjusted Body Weight:   Vital Signs: Temp: 99 F (37.2 C) (11/26 1000) Temp src: Oral (11/26 1000) BP: 162/98 mmHg (11/26 1000) Pulse Rate: 107  (11/26 1000)  Labs:  Basename 11/15/11 1420 11/15/11 0625 11/14/11 2200 11/14/11 2048 11/14/11 1830 11/13/11 0500  HGB 11.2* 10.2* -- -- -- --  HCT 33.5* 31.6* 30.6* -- -- --  PLT 290 306 274 -- -- --  APTT -- -- -- -- -- --  LABPROT -- -- -- -- -- --  INR -- -- -- -- -- --  HEPARINUNFRC 0.70 -- -- -- -- --  CREATININE -- 2.66* 2.71* -- 2.61* --  CKTOTAL -- 34 -- 40 -- 32  CKMB -- 1.7 -- 1.6 -- --  TROPONINI -- <0.30 -- <0.30 -- --   Estimated Creatinine Clearance: 44.2 ml/min (by C-G formula based on Cr of 2.66).   Assessment: Patient is a 45 y.o F on heparin for r/o PE.  S/p V/Q scan procedure today.  No PE noted with limited images obtained.  There was a delayed in getting heparin level this AM due to patient being off the unit.  Heparin level now back therapeutic but is at the higher end of therapeutic range.  Goal of Therapy:  Heparin level 0.3-0.7 units/ml   Plan:  1) Decrease heparin drip slightly to 1900 units/hr  Kharma Sampsel P 11/15/2011,3:17 PM

## 2011-11-15 NOTE — Progress Notes (Signed)
Subjective: Tired and generalized weakness.  Alert and oriented to time, date and place.    Objective: Vital signs in last 24 hours: Temp:  [98.6 F (37 C)-99.7 F (37.6 C)] 99 F (37.2 C) (11/26 1000) Pulse Rate:  [92-114] 107  (11/26 1000) Resp:  [20-41] 20  (11/26 1000) BP: (146-202)/(81-108) 162/98 mmHg (11/26 1000) SpO2:  [94 %-98 %] 97 % (11/26 1000) Weight:  [176.54 kg (389 lb 3.2 oz)] 389 lb 3.2 oz (176.54 kg) (11/25 2015)  Intake/Output from previous day: 11/25 0701 - 11/26 0700 In: 2363.8 [P.O.:1130; I.V.:1233.8] Out: 0  Intake/Output this shift: Total I/O In: 120 [P.O.:120] Out: -  Nutritional status: General  Past Medical History  Diagnosis Date  . Diabetes mellitus   . Chronic pain   . Lymph edema   . Cellulitis   . Chiari malformation     s/p surgery  . S/P colonoscopy 05/26/2007    Dr. Laural Golden sigmoid diverticulosis random biopsies benign  . CAD (coronary artery disease)   . Hypercholesterolemia   . Hypertension   . Asthma   . S/P endoscopy 05/01/2009    Dr. Penelope Coop pill-induced esophageal ulcerations distal to midesophagus, 2 small ulcers in the antrum of the stomach    Neurologic Exam: Mental Status: Alert, oriented, thought content appropriate.  Speech fluent without evidence of aphasia. Able to follow 3 step commands without difficulty. Cranial Nerves: II- Visual fields grossly intact. III/IV/VI-Extraocular movements intact.  Pupils reactive bilaterally. V/VII-Smile symmetric VIII-grossly intact IX/X-normal gag XI-bilateral shoulder shrug XII-midline tongue extension Motor: 4/5 bilaterally upper with normal tone no drift.  Bilateral lower extremities shows generalized weakness, states her legs feel very swollen. She is able to bend knees to 45 degrees antigravity but unable to hold leg up above bed.  She has significant lymphedema in bilateral LE Sensory: Pinprick and light touch intact throughout, bilaterally Deep Tendon Reflexes: 1+ and  symmetric Bil upper ext and negligible bil LE Plantars: Mute g bilaterally Cerebellar: Normal finger-to-nose, normal rapid alternating movements   Lab Results:  Basename 11/15/11 0625 11/14/11 2200  WBC 6.7 6.2  HGB 10.2* 10.2*  HCT 31.6* 30.6*  PLT 306 274  NA 140 138  K 4.3 4.3  CL 108 106  CO2 21 22  GLUCOSE 130* 128*  BUN 24* 24*  CREATININE 2.66* 2.71*  CALCIUM 7.8* 8.1*  LABA1C -- --   LP results  Glucose, CSF 68 Total Protein, CSF 18 WBC, CSF 2 Segmented Neutrophils-CSF TOO FEW TO COUNT, SMEAR AVAILABLE FOR REVIEW Lymphs, CSF 0 Monocyte-Macrophage-Spinal Fluid 0 Eosinophils, CSF 0 Appearance, CSF CLEAR Color, CSF COLORLESS Supernatant NOT INDICATED     Lipid Panel No results found for this basename: CHOL,TRIG,HDL,CHOLHDL,VLDL,LDLCALC in the last 72 hours  Studies/Results: Dg Chest Port 1 View  11/14/2011  *RADIOLOGY REPORT*  Clinical Data: Dyspnea.  PORTABLE CHEST - 1 VIEW  Comparison: Chest radiograph performed 11/11/2011  Findings: The lungs are well-aerated.  Bilateral central and mild left basilar airspace opacities are noted, with underlying vascular congestion, raising concern for mildly worsened pulmonary edema. Superimposed pneumonia cannot be excluded.  Small bilateral pleural effusions are suspected.  No pneumothorax is seen.  The cardiomediastinal silhouette is enlarged, similar in appearance to the prior study.  No acute osseous abnormalities are seen.  The patient's right PICC is noted ending about the distal SVC.  IMPRESSION: Bilateral central and mild left basilar airspace opacities, with underlying vascular congestion and cardiomegaly, raising concern for mildly worsened pulmonary edema.  Superimposed pneumonia cannot  be excluded.  Suspect small bilateral pleural effusions.  Original Report Authenticated By: Santa Lighter, M.D.    Medications:  Scheduled:   . acyclovir  10 mg/kg (Ideal) Intravenous Q12H  . amLODipine  10 mg Oral Daily  . feeding  supplement  237 mL Oral TID WC  . feeding supplement  1 Container Oral BID BM  . Flora-Q  1 capsule Oral Daily  . furosemide  40 mg Intravenous Once  . heparin  5,000 Units Intravenous Once  . hydrALAZINE  10 mg Intravenous Once  . insulin aspart  0-20 Units Subcutaneous TID WC  . insulin aspart  0-5 Units Subcutaneous QHS  . insulin aspart  5 Units Subcutaneous TID WC  . insulin glargine  10 Units Subcutaneous Daily  . metoCLOPramide (REGLAN) injection  5 mg Intravenous TID WC & HS  . metoprolol tartrate  12.5 mg Oral BID  . ondansetron  4 mg Intravenous QID  . pantoprazole  40 mg Oral Q1200  . DISCONTD: metoprolol  5 mg Intravenous Q6H  . DISCONTD: pantoprazole (PROTONIX) IV  40 mg Intravenous Q12H    Assessment/Plan: 45 YO female with  Altered mental status and FUO.  Confusion improving. LP was negative. EEG PENDING MRI unable to be obtained Continues on acyclovir although ID does not believe this represents HSV.  EEG pending to look for abnormalities representing HSV  Over night had SOB and tachypnea.  D-dimer 5.16.  To be further evaluated today with NM pulmonary Per & Vent  Discussed with Dr. Paulino Rily, if EEG is normal no further recommendations per neuro and will sign off.    Etta Quill PA-C [facility] [phone]  11/15/2011, 10:29 AM

## 2011-11-15 NOTE — Progress Notes (Addendum)
Subjective: Patient seen and examined this am . Overnight issues with tachypnea, tachycardia with drop in O2 sat and CXR showing mild vascular congestion. D dimer was done which was elevated and IV heparin initialed for anticoagulation. This morning patient denies any chest pain or SOB but feels very tired.  Objective:  Vital signs in last 24 hours:  Filed Vitals:   11/15/11 0228 11/15/11 0439 11/15/11 0743 11/15/11 1000  BP: 191/101 170/95 153/89 162/98  Pulse: 105 107 107 107  Temp: 98.9 F (37.2 C) 98.6 F (37 C) 98.8 F (37.1 C) 99 F (37.2 C)  TempSrc: Oral Oral Oral Oral  Resp: 32 _0 Height:      Weight:      SpO2: 97% 94% 96% 97%    Intake/Output from previous day:   Intake/Output Summary (Last 24 hours) at 11/15/11 1732 Last data filed at 11/15/11 0900  Gross per 24 hour  Intake 1233.83 ml  Output      0 ml  Net 1233.83 ml    Physical Exam: General: middle aged obese female in not in any acute distress.  HEENT: no pallor, no icterus, moist oral mucosa  CVS: S1-S2 clear, no murmur rubs or gallops  Chest: clear to auscultation bilaterally, no wheezing, rales or rhonchi  Abdomen: soft nontender, nondistended, normal bowel sounds, no organomegaly  Extremities: no cyanosis, clubbing, b/l chronic lymphadema  Neuro: AAOX3 ranial nerves II-XII intact, no focal neurological deficits    Lab Results:  Basic Metabolic Panel:    Component Value Date/Time   NA 140 11/15/2011 0625   K 4.3 11/15/2011 0625   CL 108 11/15/2011 0625   CO2 21 11/15/2011 0625   BUN 24* 11/15/2011 0625   CREATININE 2.66* 11/15/2011 0625   GLUCOSE 130* 11/15/2011 0625   CALCIUM 7.8* 11/15/2011 0625   CBC:    Component Value Date/Time   WBC 8.1 11/15/2011 1420   HGB 11.2* 11/15/2011 1420   HCT 33.5* 11/15/2011 1420   PLT 290 11/15/2011 1420   MCV 83.5 11/15/2011 1420   NEUTROABS 4.3 11/14/2011 2200   LYMPHSABS 0.9 11/14/2011 2200   MONOABS 0.7 11/14/2011 2200   EOSABS 0.3  11/14/2011 2200   BASOSABS 0.0 11/14/2011 2200    Recent Results (from the past 240 hour(s))  CULTURE, BLOOD (ROUTINE X 2)     Status: Normal   Collection Time   11/06/11 10:40 AM      Component Value Range Status Comment   Specimen Description BLOOD PICC LINE DRAWN BY RN   Final    Special Requests BOTTLES DRAWN AEROBIC AND ANAEROBIC [place]   Final    Culture NO GROWTH 5 DAYS   Final    Report Status 11/11/2011 FINAL   Final   CULTURE, BLOOD (ROUTINE X 2)     Status: Normal   Collection Time   11/06/11 11:19 AM      Component Value Range Status Comment   Specimen Description BLOOD PICC LINE DRAWN BY RN   Final    Special Requests BOTTLES DRAWN AEROBIC AND ANAEROBIC [place]   Final    Culture NO GROWTH 5 DAYS   Final    Report Status 11/11/2011 FINAL   Final   CLOSTRIDIUM DIFFICILE BY PCR     Status: Normal   Collection Time   11/07/11  5:43 AM      Component Value Range Status Comment   C difficile by pcr NEGATIVE  NEGATIVE  Final   CULTURE, BLOOD (  ROUTINE X 2)     Status: Normal   Collection Time   11/07/11  6:37 AM      Component Value Range Status Comment   Specimen Description BLOOD DRAWN BY RN PICC LINE   Final    Special Requests     Final    Value: BOTTLES DRAWN AEROBIC AND ANAEROBIC 6CC EACH BOTTLE   Culture NO GROWTH 5 DAYS   Final    Report Status 11/12/2011 FINAL   Final   MRSA PCR SCREENING     Status: Normal   Collection Time   11/11/11  2:39 PM      Component Value Range Status Comment   MRSA by PCR NEGATIVE  NEGATIVE  Final   URINE CULTURE     Status: Normal (Preliminary result)   Collection Time   11/12/11 11:00 PM      Component Value Range Status Comment   Specimen Description URINE, CLEAN CATCH   Final    Special Requests NONE   Final    Setup Time [id number]   Final    Colony Count 35,000 COLONIES/ML   Final    Culture ENTEROCOCCUS SPECIES   Final    Report Status PENDING   Incomplete     Studies/Results: Nm Pulmonary Per & Vent  11/15/2011   *RADIOLOGY REPORT*  Clinical Data:  Elevated D-dimer, dyspnea  NUCLEAR MEDICINE VENTILATION - PERFUSION LUNG SCAN  Technique:  Wash-in, equilibrium, and wash-out phase ventilation images were obtained using Xe-133 gas.  Perfusion images were obtained in multiple projections after intravenous injection of Tc- 21mMAA.  Radiopharmaceuticals:  10 mCi Xe-133 gas and 6 mCi Tc-961mAA.  Comparison:  None Correlation:  Chest radiograph 11/14/2011  Findings: Examination is severely limited by patient body habitus, exceeding weight limit of standard gamma camera.  Limited ventilation and perfusion images were obtained.  Ventilation exam in the anterior projections is normal.  No xenon retention.  Perfusion lung scan limited to the anterior and bilateral anterior oblique projections.  On the anterior view an apparent perfusion defect is seen at the right apex, not seen on remaining views, likely representing photon attenuation by the patient's chin/face. On the obtained images no worrisome segmental or subsegmental perfusion defects are identified.  For the limited images obtained, no worrisome findings are seen to suggest pulmonary embolism.  IMPRESSION: Limited ventilation and perfusion lung scan. The limited images obtained show no ventilatory or perfusion abnormalities.  Original Report Authenticated By: MABurnetta SabinM.D.   Dg Chest Port 1 View  11/14/2011  *RADIOLOGY REPORT*  Clinical Data: Dyspnea.  PORTABLE CHEST - 1 VIEW  Comparison: Chest radiograph performed 11/11/2011  Findings: The lungs are well-aerated.  Bilateral central and mild left basilar airspace opacities are noted, with underlying vascular congestion, raising concern for mildly worsened pulmonary edema. Superimposed pneumonia cannot be excluded.  Small bilateral pleural effusions are suspected.  No pneumothorax is seen.  The cardiomediastinal silhouette is enlarged, similar in appearance to the prior study.  No acute osseous abnormalities are seen.   The patient's right PICC is noted ending about the distal SVC.  IMPRESSION: Bilateral central and mild left basilar airspace opacities, with underlying vascular congestion and cardiomegaly, raising concern for mildly worsened pulmonary edema.  Superimposed pneumonia cannot be excluded.  Suspect small bilateral pleural effusions.  Original Report Authenticated By: JESanta LighterM.D.    Medications: Scheduled Meds:   . acyclovir  10 mg/kg (Ideal) Intravenous Q12H  . amLODipine  10 mg Oral  Daily  . feeding supplement  237 mL Oral TID WC  . feeding supplement  1 Container Oral BID BM  . Flora-Q  1 capsule Oral Daily  . furosemide  40 mg Intravenous Once  . heparin  5,000 Units Intravenous Once  . hydrALAZINE  10 mg Intravenous Once  . insulin aspart  0-20 Units Subcutaneous TID WC  . insulin aspart  0-5 Units Subcutaneous QHS  . insulin aspart  5 Units Subcutaneous TID WC  . insulin glargine  10 Units Subcutaneous Daily  . metoCLOPramide (REGLAN) injection  5 mg Intravenous TID WC & HS  . metoprolol tartrate  12.5 mg Oral BID  . ondansetron  4 mg Intravenous QID  . pantoprazole  40 mg Oral Q1200  . DISCONTD: metoprolol  5 mg Intravenous Q6H   Continuous Infusions:   . sodium chloride 10 mL/hr (11/15/11 0330)  . heparin 2,000 Units/hr (11/15/11 1038)   PRN Meds:.acetaminophen, acetaminophen, hydrALAZINE, levalbuterol, oxyCODONE-acetaminophen, phenol, senna-docusate, sodium chloride, technetium albumin aggregated, xenon xe 133, zolpidem, DISCONTD: ibuprofen  ASSESSMENT  45 year old female with history of diabetes mellitus, coronary artery disease, chronic lymphedema, morbid obesity who was initially admitted to [facility] with lower extremity cellulitis. Patient was treated with IV vancomycin and Zosyn and then subsequently discharged to home with IV Rocephin and vancomycin. Patient was readmitted on 11/04/2011 at [facility] with high-grade fevers and intermittent  confusion. Blood cultures, urine cultures were all negative, C. difficile negative, CT of the chest was unremarkable except atelectasis. CT of the abdomen, pelvis showed some pelvic lymphadenopathy. Patient was subsequently transferred to [facility] on 11/11/2011 for further workup.   Plan:  *Altered mental status/FUO:  Mental status significantly improved now and close to baseline  - Spinal tap under fluoroscopy done on 11/24 CSF study unremarkable so far except for high opening pressure as per IR . -follow final cx including cryptococcal ag  - Unclear etiology at this time, possible multifactorial in etiology with differential including persistent fever, encephalopathy/meningitis (acute bacterial versus viral infection), started on IV acyclovir  - Discussed in detail with neurology,CT head unremarkable. Neurology recommend neuropsych eval as outpt. No further recs  - Patient planned on  an open MRI of the brain if workup negative.  MRI is not feasible in-house due to weight issues. Patient however ahs been afebrile for past 3 days. LP negative except for high CSF pressure of 45. Cryptococcal ag negative. [facility]oke to [facility] for [facility] earlier today. Were not agreable to take her as she is inpatient and due to insurance issues.  Spoke with Id Dr Baxter Flattery today. Given patient being afebrile and no neurological symptoms agree with holding off on MRI  EEG done today. Results pending  Acute hypoxia on 11/25 Patient acutely hypoxic and tachycardic overnight. ABG reviewed. CXR shows increased vascular congestion chest x-ray on 11/11/2011 which showed streaky bilateral opacities questioning pneumonia, patient had the CT of the chest on 11/06/2011 which had shown bilateral atelectasis. Patient had received IV broad-spectrum antibiotics at [facility], per ID recommendations, hold off on any more antibiotics at this time as it could be residual/resolving pneumonia or atelectasis.  reepat cxr  showing increased vascular congestion and likely her symptoms over night could be due to this alone and not a PE. D/c fluids. Marland Kitchen 02 sats stable. Order lasix as needed.  VQ scan and doppler LE done today. Given large body habitus was not a clear conclusive study but there was o evidence of  PE or DVT. Will d/c heparin drip  As symptoms appear more likely tdue to volume overload. ECHO this admission showed normal EF with gr 2 diastolic dysfunction  LE cellulitis:  resolved, patient has significant lymphedema and morbid obesity.   Acute renal failure/dehydration  - Dehydration resolved  - vasculitis work up, ANA, ANCA, complement levels, CRP in progress. ESR elevated. continue to hold Demadex from the outpatient medications.  -d/c fluids, renal ultrasound was done on 11/10/2011 which showed hepatomegaly with hepatic steatosis, slight increased echogenicity of the right renal parenchyma suggestive of renal medical disease.  -appreciate renal recs. Likely in the setting of infection vs AIN from meds. SPEP ordered  Monitor labs in am   Anemia with nausea  - FOBT positive, pt seen by GI at [facility] and they have started her on Reglan for nausea  - nausea and vomiting was thought to be secondary to gastroparesis, gastric emptying study pending  -seen by GI in [facility], recommended PPI bid and follow up at [facility] for EGD  Diabetes mellitus :  Uncontrolled, HbA1c 10.2 on 11/11/2011  - Blood sugars controlled inpatient, Continue sliding scale insulin and Lantus   Morbid obesity:  BMI over 40   Transaminitis  - Likely secondary to hepatic steatosis, hepatitis panel pending   Lymphedema of lower extremity  - severe and chronic, per pt's report now at her baseline   HTN (hypertension)  - continue when necessary medications,   HLD (hyperlipidemia)  - chronic, no statins secondary to tramsamnitis   Patient is full code    LOS: 11 days   Traniya Prichett 11/15/2011, 5:32 PM

## 2011-11-15 NOTE — Progress Notes (Signed)
CARE MANAGEMENT NOTE 11/15/2011  Patient:  [name], [name]   Account Number:  [id number]  Date Initiated:  11/05/2011  Documentation initiated by:  Remo Lipps  Subjective/Objective Assessment:   45 yr old female admitted with vomiting recentlyd/c home on iv antx through [facility]  lives with her husband     Action/Plan:   pcp dr [name]   Anticipated DC Date:  11/17/2011   Anticipated DC Plan:  [place]  CM consult      [facility] Choice  Resumption Of Svcs/PTA Provider   Choice offered to / List presented to:          [facility] [address] arranged  HH-1 RN  [place].   Status of service:   Medicare Important Message given?   (If response is "NO", the following Medicare IM given date fields will be blank) Date Medicare IM given:   Date Additional Medicare IM given:    Discharge Disposition:  [place]  Per UR Regulation:    Comments:  11/26 spoke w debbie at [facility], they are still seeing pt and will resume hhc services at [place], [place] rn,bsn [phone]

## 2011-11-15 NOTE — Progress Notes (Signed)
ANTICOAGULATION CONSULT NOTE - Initial Consult  Pharmacy Consult for heparin Indication: r/o pulmonary embolus  Allergies  Allergen Reactions  . Contrast Media (Iodinated Diagnostic Agents) Swelling    Dye for cardiac cath.  . Pneumococcal Vaccines    Patient Measurements: Height: _0  (165.1 cm) Weight: 389 lb 3.2 oz (176.54 kg) IBW/kg (Calculated) : 57  Adjusted Body Weight: 102.8 kg  Vital Signs: Temp: 99.4 F (37.4 C) (11/25 2130) Temp src: Oral (11/25 2130) BP: 183/108 mmHg (11/25 2130) Pulse Rate: 107  (11/25 2130)  Labs:  Basename 11/14/11 2200 11/14/11 2048 11/14/11 1830 11/14/11 0530 11/13/11 0500 11/12/11 0840 11/12/11 0600 11/12/11 0230  HGB 10.2* -- -- 9.9* -- -- -- --  HCT 30.6* -- -- 30.3* -- -- 28.8* --  PLT 274 -- -- 287 -- -- 290 --  APTT -- -- -- -- -- -- -- --  LABPROT -- -- -- -- -- -- -- --  INR -- -- -- -- -- -- -- --  HEPARINUNFRC -- -- -- -- -- -- -- --  CREATININE 2.71* -- 2.61* 2.73* -- -- -- --  CKTOTAL -- 40 -- -- 32 38 -- --  CKMB -- 1.6 -- -- -- 1.4 -- 1.4  TROPONINI -- <0.30 -- -- -- <0.30 -- <0.30   Estimated Creatinine Clearance: 43.4 ml/min (by C-G formula based on Cr of 2.71).  Medical History: Past Medical History  Diagnosis Date  . Diabetes mellitus   . Chronic pain   . Lymph edema   . Cellulitis   . Chiari malformation     s/p surgery  . S/P colonoscopy 05/26/2007    Dr. Laural Golden sigmoid diverticulosis random biopsies benign  . CAD (coronary artery disease)   . Hypercholesterolemia   . Hypertension   . Asthma   . S/P endoscopy 05/01/2009    Dr. Penelope Coop pill-induced esophageal ulcerations distal to midesophagus, 2 small ulcers in the antrum of the stomach   Medications:  Scheduled:    . acyclovir  10 mg/kg (Ideal) Intravenous Q12H  . amLODipine  10 mg Oral Daily  . feeding supplement  237 mL Oral TID WC  . feeding supplement  1 Container Oral BID BM  . Flora-Q  1 capsule Oral Daily  . heparin  5,000 Units  Intravenous Once  . insulin aspart  0-20 Units Subcutaneous TID WC  . insulin aspart  0-5 Units Subcutaneous QHS  . insulin aspart  5 Units Subcutaneous TID WC  . insulin glargine  10 Units Subcutaneous Daily  . metoCLOPramide (REGLAN) injection  5 mg Intravenous TID WC & HS  . metoprolol tartrate  12.5 mg Oral BID  . ondansetron  4 mg Intravenous QID  . pantoprazole  40 mg Oral Q1200  . DISCONTD: metoprolol  5 mg Intravenous Q6H  . DISCONTD: pantoprazole (PROTONIX) IV  40 mg Intravenous Q12H   Assessment: 45yo female with complicated hospitalization now c/o dyspnea with RR in 40s, partially relieved with nebulizer tx, found with elevated D-dimer, awaiting VQ scan, to begin heparin for suspected PE.  Goal of Therapy:  Heparin level 0.3-0.7 units/ml   Plan:  Will give heparin bolus of 5000 units followed by gtt at 2000 units/hr and monitor heparin levels and CBC.  Rogue Bussing PharmD BCPS 11/15/2011,12:51 AM

## 2011-11-15 NOTE — Progress Notes (Signed)
11/15/2011 @ 0500 - Patient refused to allow Foley Catheter to be inserted.  Explained the process and indications to the patient and her husband.  Patient is still refusing Catheter.  Daughter also talked to patient via phone with no success.  She rec'd IV Lasix as ordered.  Her sacrum and vaginal folds are excoriated from incontinent episodes.  Bladder Scan showed 408cc of urine in bladder.  Will continue to monitor patient.  Shmuel Girgis, Thea Gist

## 2011-11-15 NOTE — Progress Notes (Signed)
Subjective: Awake, alert, husband at bedside  Objective: Vital signs in last 24 hours: Blood pressure 162/98, pulse 107, temperature 99 F (37.2 C), temperature source Oral, resp. rate 20, height _0  (1.651 m), weight 176.54 kg (389 lb 3.2 oz), SpO2 97.00%.   Intake/Output from previous day: 11/25 0701 - 11/26 0700 In: 2363.8 [P.O.:1130; I.V.:1233.8] Out: 0  Intake/Output this shift: Total I/O In: 120 [P.O.:120] Out: -   PHYSICAL EXAM General--awake, alert Chest--clear  Heart--no rub Abd--protiberant, nontender Extr--marked lymphedema  Lab Results:   Lab 11/15/11 0625 11/14/11 2200 11/14/11 1830 11/14/11 0530 11/11/11 1600  NA 140 138 138 -- --  K 4.3 4.3 4.3 -- --  CL 108 106 106 -- --  CO2 _1 -- --  BUN 24* 24* 24* -- --  CREATININE 2.66* 2.71* 2.61* -- --  EGFR -- -- -- -- --  GLUCOSE 130* -- -- -- --  CALCIUM 7.8* 8.1* 8.1* -- --  PHOS 4.0 -- -- 4.2 3.9      Basename 11/15/11 1420 11/15/11 0625  WBC 8.1 6.7  HGB 11.2* 10.2*  HCT 33.5* 31.6*  PLT 290 306    Scheduled:   . acyclovir  10 mg/kg (Ideal) Intravenous Q12H  . amLODipine  10 mg Oral Daily  . feeding supplement  237 mL Oral TID WC  . feeding supplement  1 Container Oral BID BM  . Flora-Q  1 capsule Oral Daily  . furosemide  40 mg Intravenous Once  . heparin  5,000 Units Intravenous Once  . hydrALAZINE  10 mg Intravenous Once  . insulin aspart  0-20 Units Subcutaneous TID WC  . insulin aspart  0-5 Units Subcutaneous QHS  . insulin aspart  5 Units Subcutaneous TID WC  . insulin glargine  10 Units Subcutaneous Daily  . metoCLOPramide (REGLAN) injection  5 mg Intravenous TID WC & HS  . metoprolol tartrate  12.5 mg Oral BID  . ondansetron  4 mg Intravenous QID  . pantoprazole  40 mg Oral Q1200  . DISCONTD: metoprolol  5 mg Intravenous Q6H   Continuous:   . sodium chloride 10 mL/hr (11/15/11 0330)  . heparin 2,000 Units/hr (11/15/11 1038)    Assessment/Plan: 1.  Acute Kidney  injury ? etiol.  Ibuprofen was on [facility] but it looks like she's received none since admission here on 22 Nov.  Cr no longer increasing.  I'm hopeful we'll see further decline in Cr over next few days.  2. High BP  On amlodpine and hydralazine 3.  DM--per primary svc.  LOS: 11 days   Hebe Merriwether F 11/15/2011,3:38 PM

## 2011-11-15 NOTE — Progress Notes (Signed)
Addendum triad hospitalist for Dr. Clementeen Graham. Addendum as follows. I saw this female patient earlier for dyspnea and tachypnea. Her metabolic panel has resulted showing sodium 138, potassium 4.3, chloride 106, CO2 22, BUN 24 and creatinine 2.71. CBC shows WBC 6.2, hemoglobin 10.2, hematocrit 30.6, platelets 274. Chest x-ray done this evening shows bilateral simple mild left basilar airspace opacities with underlying vascular congestion and cardiomegaly, raising concern for mildly worsened pulmonary edema. Superimposed pneumonia cannot be excluded. Suspect small bilateral pleural effusions. A 12-lead EKG looks unchanged. Blood gas done on 3 L O2 pH 7.335, PCO2 40.1, PO2 116, bicarbonate 20.8. Her cardiac enzymes to this point with the troponin less than 0.03. I feel she is probably at risk of DVT and pulmonary embolism given her obesity, peripheral vascular compromise, and current immobility. A d-dimer was obtained and this returns elevated at 5.16. I discussed this with Doctor Hal Hope we will obtain bilateral venous Doppler study to rule out DVT. We'll also obtain a VQ scan given her renal compromise to investigate for a pulmonary emboli. I see no contraindications to anticoagulation and the patient denies any prior problems with bleeding GI or otherwise. We will go and start her on a full dose heparin drip with the pharmacy to manage. It appears that possible treatment for pneumonia has been discussed with infectious disease consults and antibiotic treatment has been deferred to this point. I will not add further antibiotics at this point pending review by daytime rounding physician and infectious disease consult. The patient has been reevaluated and are currently appears quite stable including oxygen saturation. She has no current complaints and does not appear dyspneic.

## 2011-11-15 NOTE — Progress Notes (Signed)
PT/OT/SLP Cancellation Note   Treatment cancelled today due to patient receiving procedure or test:  Pt in Hemodialysis.   Kenni Newton L. Aivah Putman DPT [phone] 11/15/2011

## 2011-11-16 ENCOUNTER — Ambulatory Visit (HOSPITAL_COMMUNITY): Payer: PRIVATE HEALTH INSURANCE | Admitting: Physical Therapy

## 2011-11-16 LAB — BASIC METABOLIC PANEL
Chloride: 109 mEq/L (ref 96–112)
GFR calc Af Amer: 23 mL/min — ABNORMAL LOW (ref >90)
GFR calc non Af Amer: 20 mL/min — ABNORMAL LOW (ref >90)
Potassium: 4.5 mEq/L (ref 3.5–5.1)
Sodium: 140 mEq/L (ref 135–145)

## 2011-11-16 LAB — PROTEIN ELECTROPHORESIS, SERUM
Albumin ELP: 38.9 % — ABNORMAL LOW (ref 55.8–66.1)
M-Spike, %: NOT DETECTED g/dL
Total Protein ELP: 5.1 g/dL — ABNORMAL LOW (ref 6.0–8.3)

## 2011-11-16 LAB — GLUCOSE, CAPILLARY: Glucose-Capillary: 135 mg/dL — ABNORMAL HIGH (ref 70–99)

## 2011-11-16 MED ORDER — PANTOPRAZOLE SODIUM 40 MG PO TBEC
40.0000 mg | DELAYED_RELEASE_TABLET | Freq: Two times a day (BID) | ORAL | Status: DC
  Administered 2011-11-17 – 2011-11-22 (×12): 40 mg via ORAL
  Filled 2011-11-16 (×12): qty 1

## 2011-11-16 MED ORDER — METOPROLOL TARTRATE 25 MG PO TABS
25.0000 mg | ORAL_TABLET | Freq: Two times a day (BID) | ORAL | Status: DC
  Administered 2011-11-16 – 2011-11-17 (×3): 25 mg via ORAL
  Filled 2011-11-16 (×4): qty 1

## 2011-11-16 NOTE — Progress Notes (Signed)
Subjective: Patient informs feeling better. No overnight issues.  Objective:  Vital signs in last 24 hours:  Filed Vitals:   11/15/11 2154 11/16/11 0516 11/16/11 1000 11/16/11 1400  BP: 169/109 161/11 161/111 158/96  Pulse: 110 99 94 90  Temp: 98.8 F (37.1 C) 98.6 F (37 C) 98.6 F (37 C) 98.2 F (36.8 C)  TempSrc: Oral Oral Oral Oral  Resp: _0 Height:      Weight:      SpO2: 97% 94% 99% 98%    Intake/Output from previous day:   Intake/Output Summary (Last 24 hours) at 11/16/11 1626 Last data filed at 11/16/11 0900  Gross per 24 hour  Intake 559.33 ml  Output    950 ml  Net -390.67 ml    Physical Exam: General: middle aged obese female in not in any acute distress.  HEENT: no pallor, no icterus, moist oral mucosa  CVS: S1-S2 clear, no murmur rubs or gallops  Chest: clear to auscultation bilaterally, no wheezing, rales or rhonchi  Abdomen: soft nontender, nondistended, normal bowel sounds, no organomegaly  Extremities: no cyanosis, clubbing, b/l chronic lymphadema  Neuro: AAOX3 ranial nerves II-XII intact, no focal neurological deficits     Lab Results:  Basic Metabolic Panel:    Component Value Date/Time   NA 140 11/16/2011 0530   K 4.5 11/16/2011 0530   CL 109 11/16/2011 0530   CO2 22 11/16/2011 0530   BUN 23 11/16/2011 0530   CREATININE 2.70* 11/16/2011 0530   GLUCOSE 123* 11/16/2011 0530   CALCIUM 7.9* 11/16/2011 0530   CBC:    Component Value Date/Time   WBC 8.1 11/15/2011 1420   HGB 11.2* 11/15/2011 1420   HCT 33.5* 11/15/2011 1420   PLT 290 11/15/2011 1420   MCV 83.5 11/15/2011 1420   NEUTROABS 4.3 11/14/2011 2200   LYMPHSABS 0.9 11/14/2011 2200   MONOABS 0.7 11/14/2011 2200   EOSABS 0.3 11/14/2011 2200   BASOSABS 0.0 11/14/2011 2200    Recent Results (from the past 240 hour(s))  CLOSTRIDIUM DIFFICILE BY PCR     Status: Normal   Collection Time   11/07/11  5:43 AM      Component Value Range Status Comment   C difficile  by pcr NEGATIVE  NEGATIVE  Final   CULTURE, BLOOD (ROUTINE X 2)     Status: Normal   Collection Time   11/07/11  6:37 AM      Component Value Range Status Comment   Specimen Description BLOOD DRAWN BY RN PICC LINE   Final    Special Requests     Final    Value: BOTTLES DRAWN AEROBIC AND ANAEROBIC 6CC EACH BOTTLE   Culture NO GROWTH 5 DAYS   Final    Report Status 11/12/2011 FINAL   Final   MRSA PCR SCREENING     Status: Normal   Collection Time   11/11/11  2:39 PM      Component Value Range Status Comment   MRSA by PCR NEGATIVE  NEGATIVE  Final   URINE CULTURE     Status: Normal (Preliminary result)   Collection Time   11/12/11 11:00 PM      Component Value Range Status Comment   Specimen Description URINE, CLEAN CATCH   Final    Special Requests NONE   Final    Setup Time [id number]   Final    Colony Count 35,000 COLONIES/ML   Final    Culture ENTEROCOCCUS SPECIES   Final  Report Status PENDING   Incomplete     Studies/Results: Nm Pulmonary Per & Vent  11/15/2011  *RADIOLOGY REPORT*  Clinical Data:  Elevated D-dimer, dyspnea  NUCLEAR MEDICINE VENTILATION - PERFUSION LUNG SCAN  Technique:  Wash-in, equilibrium, and wash-out phase ventilation images were obtained using Xe-133 gas.  Perfusion images were obtained in multiple projections after intravenous injection of Tc- 84mMAA.  Radiopharmaceuticals:  10 mCi Xe-133 gas and 6 mCi Tc-971mAA.  Comparison:  None Correlation:  Chest radiograph 11/14/2011  Findings: Examination is severely limited by patient body habitus, exceeding weight limit of standard gamma camera.  Limited ventilation and perfusion images were obtained.  Ventilation exam in the anterior projections is normal.  No xenon retention.  Perfusion lung scan limited to the anterior and bilateral anterior oblique projections.  On the anterior view an apparent perfusion defect is seen at the right apex, not seen on remaining views, likely representing photon attenuation by  the patient's chin/face. On the obtained images no worrisome segmental or subsegmental perfusion defects are identified.  For the limited images obtained, no worrisome findings are seen to suggest pulmonary embolism.  IMPRESSION: Limited ventilation and perfusion lung scan. The limited images obtained show no ventilatory or perfusion abnormalities.  Original Report Authenticated By: MABurnetta SabinM.D.   Dg Chest Port 1 View  11/14/2011  *RADIOLOGY REPORT*  Clinical Data: Dyspnea.  PORTABLE CHEST - 1 VIEW  Comparison: Chest radiograph performed 11/11/2011  Findings: The lungs are well-aerated.  Bilateral central and mild left basilar airspace opacities are noted, with underlying vascular congestion, raising concern for mildly worsened pulmonary edema. Superimposed pneumonia cannot be excluded.  Small bilateral pleural effusions are suspected.  No pneumothorax is seen.  The cardiomediastinal silhouette is enlarged, similar in appearance to the prior study.  No acute osseous abnormalities are seen.  The patient's right PICC is noted ending about the distal SVC.  IMPRESSION: Bilateral central and mild left basilar airspace opacities, with underlying vascular congestion and cardiomegaly, raising concern for mildly worsened pulmonary edema.  Superimposed pneumonia cannot be excluded.  Suspect small bilateral pleural effusions.  Original Report Authenticated By: JESanta LighterM.D.    Medications: Scheduled Meds:   . acyclovir  10 mg/kg (Ideal) Intravenous Q12H  . amLODipine  10 mg Oral Daily  . feeding supplement  237 mL Oral TID WC  . Flora-Q  1 capsule Oral Daily  . insulin aspart  0-20 Units Subcutaneous TID WC  . insulin aspart  0-5 Units Subcutaneous QHS  . insulin aspart  5 Units Subcutaneous TID WC  . insulin glargine  10 Units Subcutaneous Daily  . metoCLOPramide (REGLAN) injection  5 mg Intravenous TID WC & HS  . metoprolol tartrate  25 mg Oral BID  . ondansetron  4 mg Intravenous QID  .  pantoprazole  40 mg Oral Q1200  . DISCONTD: feeding supplement  1 Container Oral BID BM  . DISCONTD: metoprolol tartrate  12.5 mg Oral BID   Continuous Infusions:   . sodium chloride 20 mL/hr at 11/15/11 2206  . DISCONTD: heparin 2,000 Units/hr (11/15/11 1038)   PRN Meds:.acetaminophen, acetaminophen, hydrALAZINE, levalbuterol, oxyCODONE-acetaminophen, phenol, senna-docusate, sodium chloride, zolpidem  ASSESSMENT  [age]ear old female with history of diabetes mellitus, coronary artery disease, chronic lymphedema, morbid obesity [facility]. Patient was treated with IV vancomycin and Zosyn and then subsequently discharged to home with IV Rocephin and vancomycin. [facility] high-grade fevers and intermittent confusion. Blood cultures, urine cultures were all negative, C. difficile negative, CT of the chest was unremarkable except atelectasis. CT of the abdomen, pelvis showed some pelvic lymphadenopathy. Patient was subsequently transferred to [facility] on 11/11/2011 for further workup.   Plan:  Altered mental status/FUO:  Mental status significantly improved now and close to baseline  - Spinal tap under fluoroscopy done on 11/24 CSF study unremarkable so far except for high opening pressure as per IR .  Culture including cryptococcal ag negative - Unclear etiology at this time, possible multifactorial in etiology with differential including persistent fever, encephalopathy/meningitis (acute bacterial versus viral infection), started on IV acyclovir ( day 4) - Discussed in detail with neurology,CT head unremarkable. Neurology recommend neuropsych eval as outpt. No further recs  - Patient planned on an open MRI of the brain if workup negative. MRI is not feasible in-house due to weight issues. Patient however has  been afebrile for past 3 days. LP negative except for  high CSF pressure of 45. Cryptococcal ag negative.  Spoke to [facility] radiology for MRI on 11/26. Were not agreable to take her as she is inpatient and due to insurance issues.  Spoke with Id Dr Baxter Flattery .  Given patient being afebrile and no neurological symptoms agree with holding off on MRI .  will discus the need to continue acyclovir ( day 4)  with ID . Elevated CMV ab and EBV IgG noted.  -EEG done on 11/26 . Negative for epileptiform activity. Neurology signed off.  Acute hypoxia on 11/25  Patient acutely hypoxic and tachycardic overnight. ABG reviewed. CXR shows increased vascular congestion  chest x-ray on 11/11/2011 which showed streaky bilateral opacities questioning pneumonia, patient had the CT of the chest on 11/06/2011 which had shown bilateral atelectasis. Patient had received IV broad-spectrum antibiotics at [facility], per ID recommendations, hold off on any more antibiotics at this time as it could be residual/resolving pneumonia or atelectasis.  reepat cxr showing increased vascular congestion and likely her symptoms over night could be due to this alone and not a PE. D/ced fluids. Marland Kitchen 02 sats stable. Order lasix as needed.  VQ scan and doppler LE done on 11/26. Given large body habitus was not a clear conclusive study but there was o evidence of PE or DVT.  d/ced heparin drip As symptoms appear more likely due to volume overload.  ECHO this admission showed normal EF with gr 2 diastolic dysfunction   LE cellulitis:  resolved, patient has significant lymphedema and morbid obesity.   Acute renal failure/dehydration  - Dehydration resolved  - vasculitis work up, ANA positive , ANCA, complement levels wnl , CRP elevated ( 0.63). ESR elevated. Elevated  LDH ( 466) , pos rh factor ( 16) continue to hold Demadex from the outpatient medications.  -d/c fluids, renal ultrasound was done on 11/10/2011 which showed hepatomegaly with hepatic steatosis, slight increased echogenicity of  the right renal parenchyma suggestive of renal medical disease.  -appreciate renal recs. Likely in the setting of infection vs AIN from meds. SPEP ordered  Monitor labs in am   Anemia with nausea  - FOBT positive, pt seen by GI at [facility] and they have started her on Reglan for nausea  - nausea and vomiting was thought to be secondary to gastroparesis, gastric emptying study pending  -seen by GI in [place], recommended PPI bid and follow up at [facility] for EGD   Diabetes mellitus :  Uncontrolled, HbA1c 10.2 on 11/11/2011  - Blood sugars controlled inpatient, Continue sliding scale insulin and Lantus   Morbid obesity:  BMI over 40   Transaminitis  - Likely secondary to hepatic steatosis, hepatitis panel negative  Lymphedema of lower extremity  - severe and chronic, per pt's report now at her baseline   HTN (hypertension)  - uncontrolled Cont metoprolol  added amlod Cont prn hydralazine   HLD (hyperlipidemia)  - chronic, no statins secondary to tramsamnitis    Patient is full code     Consults Renal ([place] [facility]) ID ( snider) Neurology ( signed off on 11/27)   pending issues Monitor improvement in renal function ID input regarding duration of acyclovir  Dispo:  home with services    LOS: 12 days   [place], [place] 11/16/2011, 4:26 PM

## 2011-11-16 NOTE — Procedures (Signed)
EEG# [id number].  HISTORY:  This is a 45 year old woman referred for rule out seizures, per report.  MEDICATIONS:  No anticonvulsive medications, per report.  CONDITION OF RECORDING:  This 16-lead EEG was performed with patient in awake and drowsy states.    Background rhythm: Background patterns in wakefulness were well-organized with a well-sustained posterior dominant rhythm of 3-6 Hz, symmetrical and reactive to eye opening and closing.Drowsiness was associated with mild change in voltage and slowing frequencies.    Abnormal potentials: no epileptiform activity or focal slowing was noted.  ACTIVATION PROCEDURES:  Hyperventilation was not performed.  Photic stimulation did not activate tracing.  EKG:  Single channel EKG monitoring detected tachycardia.  IMPRESSION:  This is an abnormal awake and drowsy EEG due to presence of moderate to severe diffuse background slowing.  The finding may be suggestive of moderate to severe diffuse cerebral dysfunction and may be due to toxic metabolic encephalopathy, neurodegenerative disorder, and/or bi-hemispheric structural abnormality.  Clinical correlation is suggested.          ______________________________ Justine Null, MD    KG:YJEH D:  11/15/2011 17:38:36  T:  11/16/2011 01:10:39  Job #:  [id number]

## 2011-11-16 NOTE — Progress Notes (Signed)
Subjective: Feels better, wants to go home  Objective: Vital signs in last 24 hours: Blood pressure 158/96, pulse 90, temperature 98.2 F (36.8 C), temperature source Oral, resp. rate 20, height _0  (1.651 m), weight 176.54 kg (389 lb 3.2 oz), SpO2 98.00%.   Intake/Output from previous day: 11/26 0701 - 11/27 0700 In: 439.3 [P.O.:120; I.V.:208.3; IV Piggyback:111] Out: 500 [Urine:500] Intake/Output this shift: Total I/O In: 240 [P.O.:240] Out: 450 [Urine:450]  PHYSICAL EXAM General--awake, alert, husband and friends at bedside Chest--clear Heart--no rub Abd--nontender Extr--lymphedema persists  Lab Results:   Lab 11/16/11 0530 11/15/11 0625 11/14/11 2200 11/14/11 0530 11/11/11 1600  NA 140 140 138 -- --  K 4.5 4.3 4.3 -- --  CL 109 108 106 -- --  CO2 _1 -- --  BUN 23 24* 24* -- --  CREATININE 2.70* 2.66* 2.71* -- --  EGFR -- -- -- -- --  GLUCOSE 123* -- -- -- --  CALCIUM 7.9* 7.8* 8.1* -- --  PHOS -- 4.0 -- 4.2 3.9    Renal fct still not decreasing  Basename 11/15/11 1420 11/15/11 0625  WBC 8.1 6.7  HGB 11.2* 10.2*  HCT 33.5* 31.6*  PLT 290 306    IV in R arm PICC line @ 20cc NS/hr ANA + 1:40, complement levels are not low Assessment/Plan: 1. Acute Kidney injury ? etiol.  Cr no longer increasing. I'm hopeful we'll see further decline in Cr over next few days. OK to d/c IVF 2. High BP On amlodpine and hydralazine  3. DM--per primary svc.     LOS: 12 days   Gena Laski F 11/16/2011,2:17 PM

## 2011-11-16 NOTE — Progress Notes (Signed)
Subjective: Patient is AOx3  Feels much better.  Has no complaints.    Objective: Vital signs in last 24 hours: Temp:  [98.6 F (37 C)-99 F (37.2 C)] 98.6 F (37 C) (11/27 0516) Pulse Rate:  [99-110] 99  (11/27 0516) Resp:  [18-20] 20  (11/27 0516) BP: (155-169)/(11-109) 161/11 mmHg (11/27 0516) SpO2:  [94 %-97 %] 94 % (11/27 0516)  Intake/Output from previous day: 11/26 0701 - 11/27 0700 In: 439.3 [P.O.:120; I.V.:208.3; IV Piggyback:111] Out: 500 [Urine:500] Intake/Output this shift:   Nutritional status:    Past Medical History  Diagnosis Date  . Diabetes mellitus   . Chronic pain   . Lymph edema   . Cellulitis   . Chiari malformation     s/p surgery  . S/P colonoscopy 05/26/2007    Dr. Laural Golden sigmoid diverticulosis random biopsies benign  . CAD (coronary artery disease)   . Hypercholesterolemia   . Hypertension   . Asthma   . S/P endoscopy 05/01/2009    Dr. Penelope Coop pill-induced esophageal ulcerations distal to midesophagus, 2 small ulcers in the antrum of the stomach    Neurologic Exam: Mental Status: Alert, oriented, thought content appropriate.  Able to follow 3 step commands without difficulty. Cranial Nerves: II-Visual fields grossly intact. III/IV/VI-Extraocular movements intact.  Pupils reactive bilaterally. V/VII-Smile symmetric VIII-grossly intact IX/X-normal gag XI-bilateral shoulder shrug XII-midline tongue extension Motor: 5/5 bilaterally upper bilateral LE has significant lymphedema which restricts movement.  Sensory: Pinprick and light touch intact throughout, bilaterally Deep Tendon Reflexes: 2+ and symmetric throughout Plantars: mute bilaterally Cerebellar: Normal finger-to-nose,    Lab Results:  Basename 11/16/11 0530 11/15/11 1420 11/15/11 0625  WBC -- 8.1 6.7  HGB -- 11.2* 10.2*  HCT -- 33.5* 31.6*  PLT -- 290 306  NA 140 -- 140  K 4.5 -- 4.3  CL 109 -- 108  CO2 22 -- 21  GLUCOSE 123* -- 130*  BUN 23 -- 24*  CREATININE 2.70*  -- 2.66*  CALCIUM 7.9* -- 7.8*  LABA1C -- -- --   Lipid Panel No results found for this basename: CHOL,TRIG,HDL,CHOLHDL,VLDL,LDLCALC in the last 72 hours  Studies/Results: Nm Pulmonary Per & Vent  11/15/2011  *RADIOLOGY REPORT*  Clinical Data:  Elevated D-dimer, dyspnea  NUCLEAR MEDICINE VENTILATION - PERFUSION LUNG SCAN  Technique:  Wash-in, equilibrium, and wash-out phase ventilation images were obtained using Xe-133 gas.  Perfusion images were obtained in multiple projections after intravenous injection of Tc- 82mMAA.  Radiopharmaceuticals:  10 mCi Xe-133 gas and 6 mCi Tc-9103mAA.  Comparison:  None Correlation:  Chest radiograph 11/14/2011  Findings: Examination is severely limited by patient body habitus, exceeding weight limit of standard gamma camera.  Limited ventilation and perfusion images were obtained.  Ventilation exam in the anterior projections is normal.  No xenon retention.  Perfusion lung scan limited to the anterior and bilateral anterior oblique projections.  On the anterior view an apparent perfusion defect is seen at the right apex, not seen on remaining views, likely representing photon attenuation by the patient's chin/face. On the obtained images no worrisome segmental or subsegmental perfusion defects are identified.  For the limited images obtained, no worrisome findings are seen to suggest pulmonary embolism.    IMPRESSION: Limited ventilation and perfusion lung scan. The limited images obtained show no ventilatory or perfusion abnormalities.  Original Report Authenticated By: MABurnetta SabinM.D.   Dg Chest Port 1 View  11/14/2011  *RADIOLOGY REPORT*  Clinical Data: Dyspnea.  PORTABLE CHEST - 1  VIEW  Comparison: Chest radiograph performed 11/11/2011  Findings: The lungs are well-aerated.  Bilateral central and mild left basilar airspace opacities are noted, with underlying vascular congestion, raising concern for mildly worsened pulmonary edema. Superimposed pneumonia  cannot be excluded.  Small bilateral pleural effusions are suspected.  No pneumothorax is seen.  The cardiomediastinal silhouette is enlarged, similar in appearance to the prior study.  No acute osseous abnormalities are seen.  The patient's right PICC is noted ending about the distal SVC.  IMPRESSION: Bilateral central and mild left basilar airspace opacities, with underlying vascular congestion and cardiomegaly, raising concern for mildly worsened pulmonary edema.  Superimposed pneumonia cannot be excluded.  Suspect small bilateral pleural effusions.  Original Report Authenticated By: Santa Lighter, M.D.    Medications:  Scheduled:   . acyclovir  10 mg/kg (Ideal) Intravenous Q12H  . amLODipine  10 mg Oral Daily  . feeding supplement  237 mL Oral TID WC  . feeding supplement  1 Container Oral BID BM  . Flora-Q  1 capsule Oral Daily  . insulin aspart  0-20 Units Subcutaneous TID WC  . insulin aspart  0-5 Units Subcutaneous QHS  . insulin aspart  5 Units Subcutaneous TID WC  . insulin glargine  10 Units Subcutaneous Daily  . metoCLOPramide (REGLAN) injection  5 mg Intravenous TID WC & HS  . metoprolol tartrate  25 mg Oral BID  . ondansetron  4 mg Intravenous QID  . pantoprazole  40 mg Oral Q1200  . DISCONTD: metoprolol tartrate  12.5 mg Oral BID   EEG# [id number].  HISTORY: This is a 45 year old woman referred for rule out seizures, per report.  MEDICATIONS: No anticonvulsive medications, per report.  CONDITION OF RECORDING: This 16-lead EEG was performed with patient in  awake and drowsy states.  Background rhythm: Background patterns in wakefulness were well-organized with a well-sustained posterior dominant rhythm of 3-6 Hz, symmetrical and reactive to eye opening and closing.Drowsiness was associated with mild change in voltage and slowing frequencies.  Abnormal potentials: no epileptiform activity or focal slowing was noted.  ACTIVATION PROCEDURES: Hyperventilation was not performed.  Photic  stimulation did not activate tracing.  EKG: Single channel EKG monitoring detected tachycardia.   IMPRESSION: This is an abnormal awake and drowsy EEG due to  presence of moderate to severe diffuse background slowing. The finding  may be suggestive of moderate to severe diffuse cerebral dysfunction and  may be due to toxic metabolic encephalopathy, neurodegenerative  disorder, and/or bi-hemispheric structural abnormality. Clinical  correlation is suggested.    Assessment/Plan:  45 YO female with Altered mental status and FUO. Now having SOB and elevated D-dimer   Confusion improved.   LP was negative.  EEG shows no epileptiform activity MRI unable to be obtained  Neuro S/O  Etta Quill PA-C [facility] [phone]  11/16/2011, 9:22 AM

## 2011-11-16 NOTE — Progress Notes (Signed)
Nutrition Follow Up  Patient transferred from [facility].  Diet: Heart Healthy with 75-100% intake reported by patient. She reports that her appetite is good and is tolerating the diet well. No c/o nausea or abdominal pain.   Supplements: Resource Breeze BID, patient drinking 100%, but states she does not feel like she needs anymore.   Weight: 176.5 kg  Meds reviewed.   IVF:     . sodium chloride 20 mL/hr at 11/15/11 2206  . DISCONTD: heparin 2,000 Units/hr (11/15/11 1038)   Labs:  CMP     Component Value Date/Time   NA 140 11/16/2011 0530   K 4.5 11/16/2011 0530   CL 109 11/16/2011 0530   CO2 22 11/16/2011 0530   GLUCOSE 123* 11/16/2011 0530   BUN 23 11/16/2011 0530   CREATININE 2.70* 11/16/2011 0530   CALCIUM 7.9* 11/16/2011 0530   PROT 6.0 11/14/2011 2200   ALBUMIN 1.8* 11/15/2011 0625   AST 127* 11/14/2011 2200   ALT 55* 11/14/2011 2200   ALKPHOS 292* 11/14/2011 2200   BILITOT 0.2* 11/14/2011 2200   GFRNONAA 20* 11/16/2011 0530   GFRAA 23* 11/16/2011 0530   Nutrition Dx: Inadequate oral intake, improved  Goal: 1. Patient will maintain weight, met 2. Patient will meet >75% of estimated needs, met  Intervention: D/C Resource Breeze at patient request due to adequate intake of meals.   Larey Seat, RD, LDN

## 2011-11-17 ENCOUNTER — Ambulatory Visit (HOSPITAL_COMMUNITY): Payer: PRIVATE HEALTH INSURANCE | Admitting: Physical Therapy

## 2011-11-17 LAB — GLUCOSE, CAPILLARY
Glucose-Capillary: 147 mg/dL — ABNORMAL HIGH (ref 70–99)
Glucose-Capillary: 152 mg/dL — ABNORMAL HIGH (ref 70–99)

## 2011-11-17 LAB — CBC
HCT: 31.8 % — ABNORMAL LOW (ref 36.0–46.0)
Hemoglobin: 10.6 g/dL — ABNORMAL LOW (ref 12.0–15.0)
MCH: 27.9 pg (ref 26.0–34.0)
MCV: 83.7 fL (ref 78.0–100.0)
RBC: 3.8 MIL/uL — ABNORMAL LOW (ref 3.87–5.11)

## 2011-11-17 LAB — MPO/PR-3 (ANCA) ANTIBODIES

## 2011-11-17 LAB — HIV-1 RNA QUANT-NO REFLEX-BLD: HIV 1 RNA Quant: <20 copies/mL (ref <20)

## 2011-11-17 LAB — RENAL FUNCTION PANEL
BUN: 22 mg/dL (ref 6–23)
Calcium: 7.9 mg/dL — ABNORMAL LOW (ref 8.4–10.5)
Creatinine, Ser: 2.62 mg/dL — ABNORMAL HIGH (ref 0.50–1.10)
Glucose, Bld: 158 mg/dL — ABNORMAL HIGH (ref 70–99)
Phosphorus: 3.9 mg/dL (ref 2.3–4.6)

## 2011-11-17 LAB — D-DIMER, QUANTITATIVE: D-Dimer, Quant: 2.63 ug/mL-FEU — ABNORMAL HIGH (ref 0.00–0.48)

## 2011-11-17 LAB — URINE CULTURE

## 2011-11-17 MED ORDER — IPRATROPIUM BROMIDE 0.02 % IN SOLN: 0.5000 mg | RESPIRATORY_TRACT | Status: DC | PRN

## 2011-11-17 MED ORDER — LEVALBUTEROL HCL 0.63 MG/3ML IN NEBU
0.6300 mg | INHALATION_SOLUTION | RESPIRATORY_TRACT | Status: DC | PRN
  Administered 2011-11-17: 0.63 mg via RESPIRATORY_TRACT
  Filled 2011-11-17: qty 3

## 2011-11-17 MED ORDER — METOPROLOL TARTRATE 50 MG PO TABS
50.0000 mg | ORAL_TABLET | Freq: Two times a day (BID) | ORAL | Status: DC
  Administered 2011-11-17 – 2011-11-22 (×10): 50 mg via ORAL
  Filled 2011-11-17 (×11): qty 1

## 2011-11-17 MED ORDER — GLUCAGON HCL (RDNA) 1 MG IJ SOLR
INTRAMUSCULAR | Status: AC
  Filled 2011-11-17: qty 1

## 2011-11-17 NOTE — Progress Notes (Signed)
Subjective: Awake, alert, wants to go home.. Still with foley cath Objective: Vital signs in last 24 hours: Blood pressure 176/91, pulse 97, temperature 98.8 F (37.1 C), temperature source Oral, resp. rate 20, height _0  (1.651 m), weight 176.54 kg (389 lb 3.2 oz), SpO2 98.00%.   Intake/Output from previous day: 11/27 0701 - 11/28 0700 In: 960.7 [P.O.:480; I.V.:480.7] Out: 1900 [Urine:1900] Intake/Output this shift: Total I/O In: 360 [P.O.:360] Out: -   PHYSICAL EXAM General--awake, alert Chest--clear  Heart--no rub Abd--corpulent Extr--lymphedema  Nurses aide reports excoriations on medial thighs and vaginal area.  They recommend leaving foley in for now  Lab Results:   Lab 11/17/11 0235 11/16/11 0530 11/15/11 0625 11/14/11 0530  NA 139 140 140 --  K 4.4 4.5 4.3 --  CL 107 109 108 --  CO2 _1 --  BUN 22 23 24* --  CREATININE 2.62* 2.70* 2.66* --  EGFR -- -- -- --  GLUCOSE 158* -- -- --  CALCIUM 7.9* 7.9* 7.8* --  PHOS 3.9 -- 4.0 4.2      Basename 11/17/11 0235 11/15/11 1420  WBC 8.0 8.1  HGB 10.6* 11.2*  HCT 31.8* 33.5*  PLT 276 290    Scheduled:   . amLODipine  10 mg Oral Daily  . feeding supplement  237 mL Oral TID WC  . Flora-Q  1 capsule Oral Daily  . insulin aspart  0-20 Units Subcutaneous TID WC  . insulin aspart  0-5 Units Subcutaneous QHS  . insulin aspart  5 Units Subcutaneous TID WC  . insulin glargine  10 Units Subcutaneous Daily  . metoCLOPramide (REGLAN) injection  5 mg Intravenous TID WC & HS  . metoprolol tartrate  25 mg Oral BID  . ondansetron  4 mg Intravenous QID  . pantoprazole  40 mg Oral BID AC  . DISCONTD: acyclovir  10 mg/kg (Ideal) Intravenous Q12H  . DISCONTD: feeding supplement  1 Container Oral BID BM  . DISCONTD: pantoprazole  40 mg Oral Q1200   Continuous:   . sodium chloride 20 mL/hr (11/17/11 0703)    Assessment/Plan:  1. Acute Kidney injury ?etiol.. Cr starting to decrease.  Baseline Cr0.77 on 01 Nov 2011. I'm hopeful we'll see further      decline in Cr over next few days.  CBC + renal profile in AM  2. High BP On amlodpine and hydralazine, metoprolol,  I'd suggest increasing B-blocker to50 BIDif BP stays up.        Avoid ACE inhib/ARBs for now 3. DM--per primary svc    LOS: 13 days   [name] F 11/17/2011,2:29 PM

## 2011-11-17 NOTE — Progress Notes (Signed)
Utilization Review Completed.Donne Anon T11/28/2012

## 2011-11-17 NOTE — Progress Notes (Signed)
CRITICAL VALUE ALERT  Critical value received:  Urine culture from 11/23 positive for VRE  Date of notification:  11/17/2011   Time of notification:  9:44 AM   Critical value read back:yes  Nurse who received alert:  Jennette Banker   MD notified (1st page):  Dr. Karleen Hampshire  Time of first page:  9:46 AM   MD notified (2nd page):  Time of second page:  Responding MD:  Karleen Hampshire  Time MD responded:  9:55 AM

## 2011-11-17 NOTE — Progress Notes (Signed)
Discussed in the long length of stay meeting Annabell Sabal [facility] 11/17/2011

## 2011-11-17 NOTE — Progress Notes (Signed)
Subjective: Patient feeling much better. Denies headache. Getting back to her usual self. Eating breakfast without difficulty. No fevers     . amLODipine  10 mg Oral Daily  . feeding supplement  237 mL Oral TID WC  . Flora-Q  1 capsule Oral Daily  . insulin aspart  0-20 Units Subcutaneous TID WC  . insulin aspart  0-5 Units Subcutaneous QHS  . insulin aspart  5 Units Subcutaneous TID WC  . insulin glargine  10 Units Subcutaneous Daily  . metoCLOPramide (REGLAN) injection  5 mg Intravenous TID WC & HS  . metoprolol tartrate  25 mg Oral BID  . ondansetron  4 mg Intravenous QID  . pantoprazole  40 mg Oral BID AC  . DISCONTD: acyclovir  10 mg/kg (Ideal) Intravenous Q12H  . DISCONTD: feeding supplement  1 Container Oral BID BM  . DISCONTD: metoprolol tartrate  12.5 mg Oral BID  . DISCONTD: pantoprazole  40 mg Oral Q1200    Objective: BP 152/88  Pulse 101  Temp(Src) 98.8 F (37.1 C) (Oral)  Resp 21  Ht _0  (1.651 m)  Wt 176.54 kg (389 lb 3.2 oz)  BMI 64.77 kg/m2  SpO2 94%  Physical Exam: General: Alert and awake, oriented x3, not in any acute distress. HEENT: anicteric sclera, pupils reactive to light and accommodation, EOMI CVS regular rate, normal r,  no murmur rubs or gallops Chest: clear to auscultation bilaterally, no wheezing, rales or rhonchi Abdomen: soft nontender, nondistended, normal bowel sounds, Extremities: severe lymphedema but no obvious cellulitis  Neuro: nonfocal  Lab Results:  Basename 11/17/11 0235 11/15/11 1420  WBC 8.0 8.1  HGB 10.6* 11.2*  HCT 31.8* 33.5*  PLT 276 290   BMET  Basename 11/17/11 0235 11/16/11 0530  NA 139 140  K 4.4 4.5  CL 107 109  CO2 23 22  GLUCOSE 158* 123*  BUN 22 23  CREATININE 2.62* 2.70*  CALCIUM 7.9* 7.9*   HSV PCR negative RPR negative Crypto ag CSF negative  Micro Results: Recent Results (from the past 240 hour(s))  CLOSTRIDIUM DIFFICILE BY PCR     Status: Normal   Collection Time   11/07/11  5:43 AM        Component Value Range Status Comment   C difficile by pcr NEGATIVE  NEGATIVE  Final   CULTURE, BLOOD (ROUTINE X 2)     Status: Normal   Collection Time   11/07/11  6:37 AM      Component Value Range Status Comment   Specimen Description BLOOD DRAWN BY RN PICC LINE   Final    Special Requests     Final    Value: BOTTLES DRAWN AEROBIC AND ANAEROBIC 6CC EACH BOTTLE   Culture NO GROWTH 5 DAYS   Final    Report Status 11/12/2011 FINAL   Final   MRSA PCR SCREENING     Status: Normal   Collection Time   11/11/11  2:39 PM      Component Value Range Status Comment   MRSA by PCR NEGATIVE  NEGATIVE  Final   URINE CULTURE     Status: Normal (Preliminary result)   Collection Time   11/12/11 11:00 PM      Component Value Range Status Comment   Specimen Description URINE, CLEAN CATCH   Final    Special Requests NONE   Final    Setup Time [id number]   Final    Colony Count 35,000 COLONIES/ML   Final    Culture  ENTEROCOCCUS SPECIES   Final    Report Status PENDING   Incomplete     Studies/Results:  EEG - abnormal, but not consistent with seizure Ct Abdomen Pelvis Wo Contrast  11/06/2011  *RADIOLOGY REPORT*  Clinical Data:  Fever of unknown origin, nausea/vomiting  CT CHEST, ABDOMEN AND PELVIS WITHOUT CONTRAST  Technique:  Multidetector CT imaging of the chest, abdomen and pelvis was performed following the standard protocol without IV contrast.  Comparison:  Chest radiographs dated 11/04/2011  CT CHEST  Findings:  Motion degraded images.  Mild dependent bilateral lower lobe opacities, likely atelectasis. No pleural effusion or pneumothorax.  Status post right thyroidectomy.  Left thyroid is unremarkable.  Mild cardiomegaly.  No pericardial effusion.  Small prevascular lymph nodes.  No suspicious axillary lymphadenopathy.  Right subclavian PICC terminating in the lower SVC.  Mild degenerative changes of the thoracic spine.  IMPRESSION: Motion degraded images.  Suspected bilateral lower lobe  atelectasis.  Mild cardiomegaly.  CT ABDOMEN AND PELVIS  Findings:  Evaluation is constrained by artifact related to body habitus and lack of intravenous contrast.  Liver, spleen, pancreas, and adrenal glands are grossly unremarkable.  Gallbladder is grossly unremarkable.  No definite inflammatory changes by CT.  No.  Kidneys are unremarkable.  No renal calculi or hydronephrosis.  No evidence of bowel obstruction.  Colonic diverticulosis, without associated inflammatory changes.  No evidence of abdominal aortic aneurysm.  Retroperitoneal/left pelvic lymphadenopathy, including:  --2.5 x 1.9 cm left para-aortic node (series 2/image 65) --2.4 x 2.0 cm left common iliac node (series 2/image 85) --3.2 x 1.6 cm left external iliac node (series 2/image 107)  No abdominopelvic ascites.  No evidence of intra-abdominal abscess.  Fat-containing umbilical hernia (series 2/image 95).  Status post hysterectomy.  No adnexal masses.  Bladder is unremarkable.  Soft tissue stranding in the left lateral abdominal wall (series 2/image 82), incompletely visualized.  Visualized osseous structures are within normal limits.  IMPRESSION: Evaluation is constrained by artifact related to body habitus and lack of intravenous contrast.  No evidence of intra-abdominal abscess.  Retroperitoneal/left pelvic lymphadenopathy, as described above.  Soft tissue stranding in the left lateral abdominal wall, incompletely visualized.  Original Report Authenticated By: Julian Hy, M.D.    11/13/2011  *RADIOLOGY REPORT*  Clinical Data: Fever of unknown origin.  Altered mental status.  CT HEAD WITHOUT CONTRAST  Technique:  Contiguous axial images were obtained from the base of the skull through the vertex without contrast.  Comparison: 03/02/2010.  Findings: Mild motion artifact slightly limits evaluation.  Poor delineation of the sylvian fissure and the anterior interhemispheric fissure.  Etiology/significance indeterminate. This appearance can be  seen with subtle subarachnoid hemorrhage however, no evidence of subarachnoid blood within the suprasellar cistern.  No evidence of intraventricular blood. This appearance can be seen with increased cerebral edema however, the thalami and lenticular nucleus as well as gray-white  differentiation is stable and well defined.  No intracranial mass lesion detected on this unenhanced exam. Chiari one malformation with postop changes post sub occipital craniectomy.  No hydrocephalus.  Question partially empty sella?  This can be seen with pseudotumor cerebri.  Exophthalmos.  Meningitis not excluded by unenhanced CT.  If there are progressive symptoms, MR imaging may be considered for further delineation.  No CT evidence of large acute infarct.  Small acute infarct cannot be excluded by CT. No intracranial mass lesion detected on this unenhanced exam.  IMPRESSION: Poor delineation of the sylvian fissure and anterior interhemispheric fissure as discussed above.  Chiari one malformation with postop changes post sub occipital craniectomy.  Original Report Authenticated By: Doug Sou, M.D.   Ct Chest Wo Contrast  11/06/2011  *RADIOLOGY REPORT*  Clinical Data:  Fever of unknown origin, nausea/vomiting  CT CHEST, ABDOMEN AND PELVIS WITHOUT CONTRAST  Technique:  Multidetector CT imaging of the chest, abdomen and pelvis was performed following the standard protocol without IV contrast.  Comparison:  Chest radiographs dated 11/04/2011  CT CHEST  Findings:  Motion degraded images.  Mild dependent bilateral lower lobe opacities, likely atelectasis. No pleural effusion or pneumothorax.  Status post right thyroidectomy.  Left thyroid is unremarkable.  Mild cardiomegaly.  No pericardial effusion.  Small prevascular lymph nodes.  No suspicious axillary lymphadenopathy.  Right subclavian PICC terminating in the lower SVC.  Mild degenerative changes of the thoracic spine.  IMPRESSION: Motion degraded images.  Suspected bilateral  lower lobe atelectasis.  Mild cardiomegaly.  CT ABDOMEN AND PELVIS  Findings:  Evaluation is constrained by artifact related to body habitus and lack of intravenous contrast.  Liver, spleen, pancreas, and adrenal glands are grossly unremarkable.  Gallbladder is grossly unremarkable.  No definite inflammatory changes by CT.  No.  Kidneys are unremarkable.  No renal calculi or hydronephrosis.  No evidence of bowel obstruction.  Colonic diverticulosis, without associated inflammatory changes.  No evidence of abdominal aortic aneurysm.  Retroperitoneal/left pelvic lymphadenopathy, including:  --2.5 x 1.9 cm left para-aortic node (series 2/image 65) --2.4 x 2.0 cm left common iliac node (series 2/image 85) --3.2 x 1.6 cm left external iliac node (series 2/image 107)  No abdominopelvic ascites.  No evidence of intra-abdominal abscess.  Fat-containing umbilical hernia (series 2/image 95).  Status post hysterectomy.  No adnexal masses.  Bladder is unremarkable.  Soft tissue stranding in the left lateral abdominal wall (series 2/image 82), incompletely visualized.  Visualized osseous structures are within normal limits.  IMPRESSION: Evaluation is constrained by artifact related to body habitus and lack of intravenous contrast.  No evidence of intra-abdominal abscess.  Retroperitoneal/left pelvic lymphadenopathy, as described above.  Soft tissue stranding in the left lateral abdominal wall, incompletely visualized.  Original Report Authenticated By: Julian Hy, M.D.   Assessment/Plan: FAREN FLORENCE is a 45 y.o. female with With diabetes mellitus and coronary artery disease and chronic lymphedema who was admitted to [facility] with cellulitis involving her left lower extremity. She was treated with intravenous antibiotics vancomycin and Zosyn while at any [facility] was discharged to home with IV ceftriaxone and vancomycin. While, she felt well but approximately 3 days after discharge she developed fevers  malaise nausea and vomiting. She was readmitted to [facility] and started on Achromycin Zosyn. She continued to have fevers and ciprofloxacin was added as was tobramycin. Despite all these various antibacterial antibiotics the patient continued to have high-grade fevers as well as intermittent confusion and largely correlated with her fevers without any pain she had a CT of the chest that was unrevealing a CT of the abdomen and pelvis that did show some pelvic lymphadenopathy. There is no obvious intra-abdominal abscess found on exam. Blood cultures and urine cultures were all negative for C. difficile by PCR was negative . She was ultimately transferred over to [facility] for further evaluation and treatment. Her CXR here was read as showing findings concerning for pna> atelectasis (though she has been on very very broad spectrum antibiotics in interim with no other films for past 7 days) Her PCT is normal. She was started on acylovir  and restarted on antibacterial antibiotics by [facility] on admission when LP was delayed. I stopped her antibacterials since then. LP  is completely benign. HSV PCR negative thus acyclovir discontinued. Patient improving dialy.   Labs so far of note, ESR is >120, RF positive.  FUO:  - resolved, although she may have autoimmune related process since RF is positive and ESR >120. Would consider follow up with rheumatology as outpatient  Encephalopathy: resolved. Patient CSF was not suggestive of bacterial or viral infection. Acyclovir discontinued since PCR is negative. Will not need to pursue brain MRI at this time. She did have high opening pressure on LP, which one would wonder if she had nph or would see with pseudotumor cerebri. Her mentation is back to baseline.   Will sign off. Please call if further questions.  Carlyle Basques MD Infectious Diseases 11/16/2011 8:30 am

## 2011-11-17 NOTE — Progress Notes (Signed)
Subjective: Comfortable denies any complaints. Patient expectantly the O. Objective: Vital signs in last 24 hours: Filed Vitals:   11/16/11 2200 11/16/11 2240 11/17/11 0543 11/17/11 1000  BP:  152/88 142/91 176/91  Pulse:  101 101 97  Temp:   98.5 F (36.9 C) 98.8 F (37.1 C)  TempSrc:   Oral Oral  Resp:  _0 Height:      Weight:      SpO2: 96% 94% 98% 98%   Weight change:   Intake/Output Summary (Last 24 hours) at 11/17/11 1442 Last data filed at 11/17/11 1300  Gross per 24 hour  Intake 1080.67 ml  Output   1450 ml  Net -369.33 ml   Physical Exam:   General Appearance:    Alert, cooperative, no distress,   Lungs:     Clear to auscultation bilaterally,   Heart:    Regular rate and rhythm, S1 and S2 normal,   Abdomen:     Soft, non-tender, bowel sounds active all four quadrants,     Extremities:   Bilateral lower extremity chronic lymphedema         Neurologic:   CNII-XII intact, normal strength, sensation and reflexes    throughout     Lab Results: Results for orders placed during the hospital encounter of 11/04/11 (from the past 24 hour(s))  GLUCOSE, CAPILLARY     Status: Abnormal   Collection Time   11/16/11  5:03 PM      Component Value Range   Glucose-Capillary 134 (*) 70 - 99 (mg/dL)  GLUCOSE, CAPILLARY     Status: Abnormal   Collection Time   11/16/11  9:34 PM      Component Value Range   Glucose-Capillary 134 (*) 70 - 99 (mg/dL)  TROPONIN I     Status: Normal   Collection Time   11/17/11 12:55 AM      Component Value Range   Troponin I <0.30  <0.30 (ng/mL)  D-DIMER, QUANTITATIVE     Status: Abnormal   Collection Time   11/17/11 12:55 AM      Component Value Range   D-Dimer, Quant 2.63 (*) 0.00 - 0.48 (ug/mL-FEU)  RENAL FUNCTION PANEL     Status: Abnormal   Collection Time   11/17/11  2:35 AM      Component Value Range   Sodium 139  135 - 145 (mEq/L)   Potassium 4.4  3.5 - 5.1 (mEq/L)   Chloride 107  96 - 112 (mEq/L)   CO2 23  19 - 32  (mEq/L)   Glucose, Bld 158 (*) 70 - 99 (mg/dL)   BUN 22  6 - 23 (mg/dL)   Creatinine, Ser 2.62 (*) 0.50 - 1.10 (mg/dL)   Calcium 7.9 (*) 8.4 - 10.5 (mg/dL)   Phosphorus 3.9  2.3 - 4.6 (mg/dL)   Albumin 1.7 (*) 3.5 - 5.2 (g/dL)   GFR calc non Af Amer 21 (*) >90 (mL/min)   GFR calc Af Amer 24 (*) >90 (mL/min)  CBC     Status: Abnormal   Collection Time   11/17/11  2:35 AM      Component Value Range   WBC 8.0  4.0 - 10.5 (K/uL)   RBC 3.80 (*) 3.87 - 5.11 (MIL/uL)   Hemoglobin 10.6 (*) 12.0 - 15.0 (g/dL)   HCT 31.8 (*) 36.0 - 46.0 (%)   MCV 83.7  78.0 - 100.0 (fL)   MCH 27.9  26.0 - 34.0 (pg)   MCHC 33.3  30.0 - 36.0 (g/dL)   RDW 17.5 (*) 11.5 - 15.5 (%)   Platelets 276  150 - 400 (K/uL)  GLUCOSE, CAPILLARY     Status: Abnormal   Collection Time   11/17/11  7:57 AM      Component Value Range   Glucose-Capillary 147 (*) 70 - 99 (mg/dL)  GLUCOSE, CAPILLARY     Status: Abnormal   Collection Time   11/17/11 12:00 PM      Component Value Range   Glucose-Capillary 192 (*) 70 - 99 (mg/dL)    Micro Results: Recent Results (from the past 240 hour(s))  MRSA PCR SCREENING     Status: Normal   Collection Time   11/11/11  2:39 PM      Component Value Range Status Comment   MRSA by PCR NEGATIVE  NEGATIVE  Final   URINE CULTURE     Status: Normal   Collection Time   11/12/11 11:00 PM      Component Value Range Status Comment   Specimen Description URINE, CLEAN CATCH   Final    Special Requests NONE   Final    Setup Time [id number]   Final    Colony Count 35,000 COLONIES/ML   Final    Culture     Final    Value: VANCOMYCIN RESISTANT ENTEROCOCCUS ISOLATED     Note: CRITICAL RESULT CALLED TO, READ BACK BY AND VERIFIED WITH: Leeroy Cha @ [id number] BY BOVET 11/17/11   Report Status 11/17/2011 FINAL   Final    Organism ID, Bacteria VANCOMYCIN RESISTANT ENTEROCOCCUS ISOLATED   Final    Studies/Results: No results found. Medications: Reviewed Scheduled Meds:   . amLODipine  10 mg Oral  Daily  . feeding supplement  237 mL Oral TID WC  . Flora-Q  1 capsule Oral Daily  . insulin aspart  0-20 Units Subcutaneous TID WC  . insulin aspart  0-5 Units Subcutaneous QHS  . insulin aspart  5 Units Subcutaneous TID WC  . insulin glargine  10 Units Subcutaneous Daily  . metoCLOPramide (REGLAN) injection  5 mg Intravenous TID WC & HS  . metoprolol tartrate  50 mg Oral BID  . ondansetron  4 mg Intravenous QID  . pantoprazole  40 mg Oral BID AC  . DISCONTD: acyclovir  10 mg/kg (Ideal) Intravenous Q12H  . DISCONTD: feeding supplement  1 Container Oral BID BM  . DISCONTD: metoprolol tartrate  25 mg Oral BID  . DISCONTD: pantoprazole  40 mg Oral Q1200   Continuous Infusions:   . sodium chloride 20 mL/hr (11/17/11 0703)   PRN Meds:.acetaminophen, acetaminophen, hydrALAZINE, ipratropium, levalbuterol, oxyCODONE-acetaminophen, phenol, senna-docusate, sodium chloride, zolpidem, DISCONTD: levalbuterol Assessment/Plan: Plan:  Altered mental status/FUO:  Mental status at baseline  - Spinal tap under fluoroscopy done on 11/24 CSF study unremarkable so far except for high opening pressure as per IR .  Culture including cryptococcal ag negative  - Unclear etiology at this time, possible multifactorial in etiology - Discussed in detail with neurology,CT head unremarkable. Neurology recommend neuropsych eval as outpt. No further recs  - Patient planned on an open MRI of the brain if workup negative. MRI is not feasible in-house due to weight issues. Patient however has been afebrile for past 3 days. LP negative except for high CSF pressure of 45. Cryptococcal ag negative.  [facility] on 11/26. Were not agreable to take her as she is inpatient and due to insurance issues.  Spoke with Id Dr Baxter Flattery .  Given patient being afebrile and no neurological symptoms agree with holding off on MRI .  Acyclovir discontinued and ID SIGNED OFF.Marland Kitchen Elevated CMV ab and EBV IgG noted.  -EEG done  on 11/26 . Negative for epileptiform activity. Neurology signed off.  Acute hypoxia on 11/25  Patient acutely hypoxic and tachycardic overnight. ABG reviewed. CXR shows increased vascular congestion  chest x-ray on 11/11/2011 which showed streaky bilateral opacities questioning pneumonia, patient had the CT of the chest on 11/06/2011 which had shown bilateral atelectasis. Patient had received IV broad-spectrum antibiotics at [facility], per ID recommendations, hold off on any more antibiotics at this time as it could be residual/resolving pneumonia or atelectasis.  reepat cxr showing increased vascular congestion and likely her symptoms over night could be due to this alone and not a PE. D/ced fluids. Marland Kitchen 02 sats stable. Order lasix as needed.  VQ scan and doppler LE done on 11/26. Given large body habitus was not a clear conclusive study but there was o evidence of PE or DVT. d/ced heparin drip As symptoms appear more likely due to volume overload.  ECHO this admission showed normal EF with gr 2 diastolic dysfunction  LE cellulitis:  resolved, patient has significant lymphedema and morbid obesity.  Acute renal failure/dehydration  - Dehydration resolved  - vasculitis work up, ANA positive , ANCA, complement levels wnl , CRP elevated ( 0.63). ESR elevated. Elevated LDH ( 466) , pos rh factor ( 16)  continue to hold Demadex from the outpatient medications.  -d/c fluids, renal ultrasound was done on 11/10/2011 which showed hepatomegaly with hepatic steatosis, slight increased echogenicity of the right renal parenchyma suggestive of renal medical disease.  -appreciate renal recs. Likely in the setting of infection vs AIN from meds. SPEP ordered  Monitor labs in am  Anemia with nausea  - FOBT positive, pt seen by GI at [facility] and they have started her on Reglan for nausea  - nausea and vomiting was thought to be secondary to gastroparesis, gastric emptying study pending  -seen by GI in [place],  recommended PPI bid and follow up at [facility] for EGD  Diabetes mellitus :  Uncontrolled, HbA1c 10.2 on 11/11/2011  - Blood sugars controlled inpatient, Continue sliding scale insulin and Lantus  Morbid obesity:  BMI over 40  Transaminitis  - Likely secondary to hepatic steatosis, hepatitis panel negative  Lymphedema of lower extremity  - severe and chronic, per pt's report now at her baseline  HTN (hypertension)  - uncontrolled  Cont metoprolol  added amlod  Cont prn hydralazine  HLD (hyperlipidemia)  - chronic, no statins secondary to tramsamnitis  Patient is full code   pending issues  Monitor improvement in renal function for the next 48 hours.  Dispo:  home with services    LOS: 13 days   [name] 11/17/2011, 2:42 PM

## 2011-11-17 NOTE — Progress Notes (Signed)
Physical Therapy Treatment Patient Details Name: [name] MRN: [id number] DOB: 1966/12/07 Today's Date: 11/17/2011  PT Assessment/Plan  PT - Assessment/Plan Comments on Treatment Session: pt mobility improving with exposure.  Pt left in Recliner. unable to elevate LEs with recliner secondary to LEs  too heavy.  Elevated  LEs on 3in1.  Instructed pt to inform RN if LE pain or discomfort  occur.   PT Plan: Discharge plan remains appropriate PT Frequency: Min 2X/week Follow Up Recommendations: Inpatient Rehab Equipment Recommended: Defer to next venue PT Goals  Acute Rehab PT Goals PT Goal Formulation: With patient Time For Goal Achievement: 2 weeks Pt will Roll Supine to Right Side: with modified independence;with rail PT Goal: Rolling Supine to Right Side - Progress: Progressing toward goal Pt will Roll Supine to Left Side: with modified independence;with rail PT Goal: Rolling Supine to Left Side - Progress: Progressing toward goal Pt will go Supine/Side to Sit: with min assist PT Goal: Supine/Side to Sit - Progress: Progressing toward goal Pt will go Sit to Supine/Side: with mod assist PT Goal: Sit to Supine/Side - Progress: Progressing toward goal Pt will Transfer Bed to Chair/Chair to Bed: with mod assist PT Transfer Goal: Bed to Chair/Chair to Bed - Progress: Progressing toward goal Pt will Ambulate: 1 - 15 feet;with mod assist;with rolling walker PT Goal: Ambulate - Progress: Progressing toward goal  PT Treatment Precautions/Restrictions  Precautions Precautions: Fall Restrictions Weight Bearing Restrictions: No Mobility (including Balance) Bed Mobility Bed Mobility: Yes Rolling Left: 2: Max assist;With rail (pt 40%) Rolling Left Details (indicate cue type and reason): pulling on rail with bil UE, assisti for Rt LE management and Rt hip rotation. Left Sidelying to Sit: 2: Max assist;HOB elevated (comment degrees);Patient percentage (comment) (pt 30%) Left  Sidelying to Sit Details (indicate cue type and reason): assist for bil LE and to scoot hips forward. Pt pulling on my hand for leverage more effectively than pulling on rail.  Transfers Transfers: Yes Sit to Stand: 3: Mod assist;With upper extremity assist;From elevated surface;Patient percentage (comment);From bed Sit to Stand Details (indicate cue type and reason): cues for hand placement on RW, assist for anterior wt shift  Stand to Sit: 3: Mod assist;With upper extremity assist;To bed;Patient percentage (comment) Stand to Sit Details: cues for hand placement on recliner and controlled descent Stand Pivot Transfers: 3: Mod assist Stand Pivot Transfer Details (indicate cue type and reason): cues for sequencing and assist for walker management. difficult to positon pt in walker secondary to width of pt's LEs. Ambulation/Gait Ambulation/Gait: No Stairs: No Wheelchair Mobility Wheelchair Mobility: No  Posture/Postural Control Posture/Postural Control: No significant limitations Balance Balance Assessed: No Exercise    End of Session PT - End of Session Equipment Utilized During Treatment: Gait belt Activity Tolerance: Patient limited by fatigue Patient left: in chair;with bed alarm set;with family/visitor present (see comment on treatment session.  ) Nurse Communication: Mobility status for transfers;Need for lift equipment General Behavior During Session: [name] for tasks performed Cognition: [name] for tasks performed  [name] L. [name] DPT [phone] 11/17/2011, 4:32 PM

## 2011-11-18 ENCOUNTER — Ambulatory Visit (HOSPITAL_COMMUNITY): Payer: PRIVATE HEALTH INSURANCE | Admitting: Physical Therapy

## 2011-11-18 LAB — RENAL FUNCTION PANEL
Albumin: 1.8 g/dL — ABNORMAL LOW (ref 3.5–5.2)
Chloride: 108 mEq/L (ref 96–112)
GFR calc Af Amer: 23 mL/min — ABNORMAL LOW (ref >90)
GFR calc non Af Amer: 20 mL/min — ABNORMAL LOW (ref >90)
Phosphorus: 4 mg/dL (ref 2.3–4.6)
Potassium: 4.3 mEq/L (ref 3.5–5.1)
Sodium: 141 mEq/L (ref 135–145)

## 2011-11-18 LAB — CBC
Platelets: 315 10*3/uL (ref 150–400)
RBC: 3.88 MIL/uL (ref 3.87–5.11)
RDW: 18 % — ABNORMAL HIGH (ref 11.5–15.5)
WBC: 6.4 10*3/uL (ref 4.0–10.5)

## 2011-11-18 LAB — GLUCOSE, CAPILLARY
Glucose-Capillary: 114 mg/dL — ABNORMAL HIGH (ref 70–99)
Glucose-Capillary: 127 mg/dL — ABNORMAL HIGH (ref 70–99)
Glucose-Capillary: 166 mg/dL — ABNORMAL HIGH (ref 70–99)

## 2011-11-18 MED ORDER — SODIUM CHLORIDE 0.9 % IV SOLN: INTRAVENOUS | Status: DC

## 2011-11-18 NOTE — Progress Notes (Signed)
Subjective: Pt wants to go home. No complaints.  Objective: Vital signs in last 24 hours: Filed Vitals:   11/17/11 2149 11/18/11 0506 11/18/11 0850 11/18/11 1409  BP: 138/84 184/95 120/84 150/97  Pulse: 88 104 96 80  Temp: 97.1 F (36.2 C) 98 F (36.7 C) 98.2 F (36.8 C) 98.1 F (36.7 C)  TempSrc: Oral Oral Oral Oral  Resp: _0 Height:      Weight:      SpO2: 99% 98% 99% 96%   Weight change:   Intake/Output Summary (Last 24 hours) at 11/18/11 1726 Last data filed at 11/18/11 1415  Gross per 24 hour  Intake    840 ml  Output   1150 ml  Net   -310 ml   Physical Exam:   General Appearance:    Alert, cooperative, no distress, appears stated age  Lungs:     Clear to auscultation bilaterally, respirations unlabored   Heart:    Regular rate and rhythm, S1 and S2 normal,  Abdomen:     Soft, non-tender, bowel sounds active all four quadrants,      Extremities:   Bilateral lower extremity chronic lymphedema          Neurologic:   CNII-XII intact, normal strength, sensation and reflexes    throughout     Lab Results: Results for orders placed during the hospital encounter of 11/04/11 (from the past 24 hour(s))  GLUCOSE, CAPILLARY     Status: Abnormal   Collection Time   11/17/11 10:00 PM      Component Value Range   Glucose-Capillary 114 (*) 70 - 99 (mg/dL)  RENAL FUNCTION PANEL     Status: Abnormal   Collection Time   11/18/11  5:53 AM      Component Value Range   Sodium 141  135 - 145 (mEq/L)   Potassium 4.3  3.5 - 5.1 (mEq/L)   Chloride 108  96 - 112 (mEq/L)   CO2 23  19 - 32 (mEq/L)   Glucose, Bld 141 (*) 70 - 99 (mg/dL)   BUN 23  6 - 23 (mg/dL)   Creatinine, Ser 2.70 (*) 0.50 - 1.10 (mg/dL)   Calcium 8.2 (*) 8.4 - 10.5 (mg/dL)   Phosphorus 4.0  2.3 - 4.6 (mg/dL)   Albumin 1.8 (*) 3.5 - 5.2 (g/dL)   GFR calc non Af Amer 20 (*) >90 (mL/min)   GFR calc Af Amer 23 (*) >90 (mL/min)  CBC     Status: Abnormal   Collection Time   11/18/11  5:53 AM    Component Value Range   WBC 6.4  4.0 - 10.5 (K/uL)   RBC 3.88  3.87 - 5.11 (MIL/uL)   Hemoglobin 10.5 (*) 12.0 - 15.0 (g/dL)   HCT 33.0 (*) 36.0 - 46.0 (%)   MCV 85.1  78.0 - 100.0 (fL)   MCH 27.1  26.0 - 34.0 (pg)   MCHC 31.8  30.0 - 36.0 (g/dL)   RDW 18.0 (*) 11.5 - 15.5 (%)   Platelets 315  150 - 400 (K/uL)  GLUCOSE, CAPILLARY     Status: Abnormal   Collection Time   11/18/11  7:24 AM      Component Value Range   Glucose-Capillary 127 (*) 70 - 99 (mg/dL)  GLUCOSE, CAPILLARY     Status: Abnormal   Collection Time   11/18/11 11:42 AM      Component Value Range   Glucose-Capillary 158 (*) 70 - 99 (mg/dL)  GLUCOSE, CAPILLARY     Status: Abnormal   Collection Time   11/18/11  5:07 PM      Component Value Range   Glucose-Capillary 153 (*) 70 - 99 (mg/dL)    Micro Results: Recent Results (from the past 240 hour(s))  MRSA PCR SCREENING     Status: Normal   Collection Time   11/11/11  2:39 PM      Component Value Range Status Comment   MRSA by PCR NEGATIVE  NEGATIVE  Final   URINE CULTURE     Status: Normal   Collection Time   11/12/11 11:00 PM      Component Value Range Status Comment   Specimen Description URINE, CLEAN CATCH   Final    Special Requests NONE   Final    Setup Time [id number]   Final    Colony Count 35,000 COLONIES/ML   Final    Culture     Final    Value: VANCOMYCIN RESISTANT ENTEROCOCCUS ISOLATED     Note: CRITICAL RESULT CALLED TO, READ BACK BY AND VERIFIED WITH: Leeroy Cha @ [id number] BY BOVET 11/17/11   Report Status 11/17/2011 FINAL   Final    Organism ID, Bacteria VANCOMYCIN RESISTANT ENTEROCOCCUS ISOLATED   Final    Studies/Results: No results found. Medications: reviewed. Scheduled Meds:   . amLODipine  10 mg Oral Daily  . feeding supplement  237 mL Oral TID WC  . Flora-Q  1 capsule Oral Daily  . insulin aspart  0-20 Units Subcutaneous TID WC  . insulin aspart  0-5 Units Subcutaneous QHS  . insulin aspart  5 Units Subcutaneous TID WC  .  insulin glargine  10 Units Subcutaneous Daily  . metoCLOPramide (REGLAN) injection  5 mg Intravenous TID WC & HS  . metoprolol tartrate  50 mg Oral BID  . ondansetron  4 mg Intravenous QID  . pantoprazole  40 mg Oral BID AC   Continuous Infusions:   . sodium chloride 20 mL/hr (11/17/11 0703)  . sodium chloride     PRN Meds:.acetaminophen, acetaminophen, hydrALAZINE, ipratropium, levalbuterol, oxyCODONE-acetaminophen, phenol, senna-docusate, sodium chloride, zolpidem Assessment/Plan: Altered mental status/FUO:  Mental status at baseline  - Spinal tap under fluoroscopy done on 11/24 CSF study unremarkable so far except for high opening pressure as per IR .  Culture including cryptococcal ag negative  - Unclear etiology at this time, possible multifactorial in etiology - Discussed in detail with neurology,CT head unremarkable. Neurology recommend neuropsych eval as outpt. No further recs  - Patient planned on an open MRI of the brain if workup negative. MRI is not feasible in-house due to weight issues. Patient however has been afebrile for past 3 days. LP negative except for high CSF pressure of 45. Cryptococcal ag negative.  [facility] for [facility] on 11/26. Were not agreable to take her as she is inpatient and due to insurance issues.  Spoke with Id Dr Baxter Flattery . Given patient being afebrile and no neurological symptoms agree with holding off on MRI .  Acyclovir discontinued and ID SIGNED OFF.Marland Kitchen Elevated CMV ab and EBV IgG noted.  -EEG done on 11/26 . Negative for epileptiform activity. Neurology signed off.  Acute hypoxia on 11/25  Patient acutely hypoxic and tachycardic overnight. ABG reviewed. CXR shows increased vascular congestion  chest x-ray on 11/11/2011 which showed streaky bilateral opacities questioning pneumonia, patient had the CT of the chest on 11/06/2011 which had shown bilateral atelectasis. Patient had received IV broad-spectrum [facility] at [facility], per  ID recommendations, hold off on any more antibiotics at this time as it could be residual/resolving pneumonia or atelectasis.  reepat cxr showing increased vascular congestion and likely her symptoms over night could be due to this alone and not a PE. D/ced fluids. Marland Kitchen 02 sats stable. Order lasix as needed.  VQ scan and doppler LE done on 11/26. Given large body habitus was not a clear conclusive study but there was o evidence of PE or DVT. d/ced heparin drip As symptoms appear more likely due to volume overload.  ECHO this admission showed normal EF with gr 2 diastolic dysfunction  LE cellulitis:  resolved, patient has significant lymphedema and morbid obesity.  Acute renal failure/dehydration  - Dehydration resolved  - vasculitis work up, ANA positive , ANCA, complement levels wnl , CRP elevated ( 0.63). ESR elevated. Elevated LDH ( 466) , pos rh factor ( 16)  continue to hold Demadex from the outpatient medications.  -d/c fluids, renal ultrasound was done on 11/10/2011 which showed hepatomegaly with hepatic steatosis, slight increased echogenicity of the right renal parenchyma suggestive of renal medical disease.  -appreciate renal recs. Likely in the setting of infection vs AIN from meds. SPEP ordered  Monitor labs in am  Anemia with nausea  - FOBT positive, pt seen by GI at [facility] and they have started her on Reglan for nausea  - nausea and vomiting was thought to be secondary to gastroparesis, gastric emptying study pending  -seen by GI in [place], recommended PPI bid and follow up at [facility] for EGD  Diabetes mellitus :  Uncontrolled, HbA1c 10.2 on 11/11/2011  - Blood sugars controlled inpatient, Continue sliding scale insulin and Lantus  Morbid obesity:  BMI over 40  Transaminitis  - Likely secondary to hepatic steatosis, hepatitis panel negative  Lymphedema of lower extremity  - severe and chronic, per pt's report now at her baseline  HTN (hypertension)  - uncontrolled  Cont  metoprolol  added amlodipine Cont prn hydralazine  HLD (hyperlipidemia)  - chronic, no statins secondary to tramsamnitis    pending issues  Monitor improvement in renal function for the next 24hours.  Dispo:  home with services   LOS: 14 days   [name] 11/18/2011, 5:26 PM

## 2011-11-18 NOTE — Progress Notes (Signed)
Subjective: Feels well wants to go home  Objective: Vital signs in last 24 hours: Blood pressure 120/84, pulse 96, temperature 98.2 [name] (36.8 C), temperature source Oral, resp. rate 20, height _0  (1.651 m), weight 176.54 kg (389 lb 3.2 oz), SpO2 99.00%.   Intake/Output from previous day: 11/28 0701 - 11/29 0700 In: 840 [P.O.:840] Out: 700 [Urine:700] Intake/Output this shift: Total I/O In: 120 [P.O.:120] Out: 350 [Urine:350] Wt Readings from Last 3 Encounters:  11/14/11 176.54 kg (389 lb 3.2 oz)  10/26/11 149.687 kg (330 lb)     PHYSICAL EXAM General--Awake, alert, wants to go home Chest--clear  Heart--no rub Abd--protuberant, nontender Extr--lymphedema, some edema of hands  Lab Results:   Lab 11/18/11 0553 11/17/11 0235 11/16/11 0530 11/15/11 0625  NA 141 139 140 --  K 4.3 4.4 4.5 --  CL 108 107 109 --  CO2 _1 --  BUN _2 --  CREATININE 2.70* 2.62* 2.70* --  EGFR -- -- -- --  GLUCOSE 141* -- -- --  CALCIUM 8.2* 7.9* 7.9* --  PHOS 4.0 3.9 -- 4.0      Basename 11/18/11 0553 11/17/11 0235  WBC 6.4 8.0  HGB 10.5* 10.6*  HCT 33.0* 31.8*  PLT 315 276    Scheduled:   . amLODipine  10 mg Oral Daily  . feeding supplement  237 mL Oral TID WC  . Flora-Q  1 capsule Oral Daily  . insulin aspart  0-20 Units Subcutaneous TID WC  . insulin aspart  0-5 Units Subcutaneous QHS  . insulin aspart  5 Units Subcutaneous TID WC  . insulin glargine  10 Units Subcutaneous Daily  . metoCLOPramide (REGLAN) injection  5 mg Intravenous TID WC & HS  . metoprolol tartrate  50 mg Oral BID  . ondansetron  4 mg Intravenous QID  . pantoprazole  40 mg Oral BID AC  . DISCONTD: metoprolol tartrate  25 mg Oral BID   Continuous:   . sodium chloride 20 mL/hr (11/17/11 0703)  . sodium chloride      Assessment/Plan:   1. Acute Kidney injury ?etiol.. Cr started to decrease but holding steady for now. Baseline Cr0.77 on 01 Nov 2011. I'm hopeful we'll see further decline  in Cr over next few days. I've restarted NS 2 120/hr for now  2. High BP On amlodpine and hydralazine, metoprolol, Avoid ACE inhib/ARBs for now  3. DM--per primary svc   LOS: 14 days   [name] 11/18/2011,12:38 PM

## 2011-11-19 LAB — GLUCOSE, CAPILLARY
Glucose-Capillary: 160 mg/dL — ABNORMAL HIGH (ref 70–99)
Glucose-Capillary: 169 mg/dL — ABNORMAL HIGH (ref 70–99)
Glucose-Capillary: 161 mg/dL — ABNORMAL HIGH (ref 70–99)
Glucose-Capillary: 186 mg/dL — ABNORMAL HIGH (ref 70–99)

## 2011-11-19 LAB — RENAL FUNCTION PANEL
Albumin: 1.7 g/dL — ABNORMAL LOW (ref 3.5–5.2)
BUN: 27 mg/dL — ABNORMAL HIGH (ref 6–23)
Calcium: 8.4 mg/dL (ref 8.4–10.5)
Chloride: 110 mEq/L (ref 96–112)
Creatinine, Ser: 2.95 mg/dL — ABNORMAL HIGH (ref 0.50–1.10)

## 2011-11-19 LAB — CBC
HCT: 30 % — ABNORMAL LOW (ref 36.0–46.0)
Hemoglobin: 9.7 g/dL — ABNORMAL LOW (ref 12.0–15.0)
MCH: 27.6 pg (ref 26.0–34.0)
RBC: 3.51 MIL/uL — ABNORMAL LOW (ref 3.87–5.11)

## 2011-11-19 LAB — QUANTIFERON TB GOLD ASSAY (BLOOD)

## 2011-11-19 MED ORDER — SODIUM CHLORIDE 0.9 % IV SOLN
INTRAVENOUS | Status: DC
  Administered 2011-11-21: 1000 mL via INTRAVENOUS

## 2011-11-19 MED ORDER — HEPARIN SODIUM (PORCINE) 5000 UNIT/ML IJ SOLN
5000.0000 [IU] | Freq: Three times a day (TID) | INTRAMUSCULAR | Status: DC
  Administered 2011-11-19 – 2011-11-22 (×9): 5000 [IU] via SUBCUTANEOUS
  Filled 2011-11-19 (×12): qty 1

## 2011-11-19 MED ORDER — FUROSEMIDE 10 MG/ML IJ SOLN
80.0000 mg | Freq: Four times a day (QID) | INTRAMUSCULAR | Status: AC
  Administered 2011-11-19 – 2011-11-20 (×4): 80 mg via INTRAVENOUS
  Filled 2011-11-19 (×4): qty 8

## 2011-11-19 MED ORDER — TORSEMIDE 20 MG/2ML IV SOLN
20.0000 mg | Freq: Four times a day (QID) | INTRAVENOUS | Status: DC
  Filled 2011-11-19 (×4): qty 2

## 2011-11-19 NOTE — Progress Notes (Signed)
Subjective: Very much interested to go home.  Objective: Vital signs in last 24 hours: Filed Vitals:   11/18/11 1731 11/18/11 2135 11/19/11 0300 11/19/11 1000  BP: 144/84 156/101 143/91 133/84  Pulse: 83 90 81 84  Temp: 97.9 F (36.6 C) 98.3 F (36.8 C) 97.8 F (36.6 C) 98.2 F (36.8 C)  TempSrc: Oral Oral Oral Oral  Resp: _0 Height:      Weight:  175.5 kg (386 lb 14.5 oz)    SpO2: 94% 90% 100% 97%   Weight change:   Intake/Output Summary (Last 24 hours) at 11/19/11 1514 Last data filed at 11/19/11 0900  Gross per 24 hour  Intake 2501.67 ml  Output    175 ml  Net 2326.67 ml   Physical Exam:   General Appearance:    Alert, cooperative, no distress, appears stated age  Lungs:     Clear to auscultation bilaterally, respirations unlabored   Heart:    Regular rate and rhythm, S1 and S2 normal  Abdomen:     Soft, non-tender, bowel sounds active all four quadrants,      Extremities:   Extremities chronic lymedema        Neurologic:   CNII-XII intact, normal strength, sensation and reflexes    throughout     Lab Results: Results for orders placed during the hospital encounter of 11/04/11 (from the past 24 hour(s))  GLUCOSE, CAPILLARY     Status: Abnormal   Collection Time   11/18/11  5:07 PM      Component Value Range   Glucose-Capillary 153 (*) 70 - 99 (mg/dL)  GLUCOSE, CAPILLARY     Status: Abnormal   Collection Time   11/18/11  9:41 PM      Component Value Range   Glucose-Capillary 166 (*) 70 - 99 (mg/dL)   Comment 1 Notify RN    RENAL FUNCTION PANEL     Status: Abnormal   Collection Time   11/19/11  6:05 AM      Component Value Range   Sodium 143  135 - 145 (mEq/L)   Potassium 4.6  3.5 - 5.1 (mEq/L)   Chloride 110  96 - 112 (mEq/L)   CO2 26  19 - 32 (mEq/L)   Glucose, Bld 183 (*) 70 - 99 (mg/dL)   BUN 27 (*) 6 - 23 (mg/dL)   Creatinine, Ser 2.95 (*) 0.50 - 1.10 (mg/dL)   Calcium 8.4  8.4 - 10.5 (mg/dL)   Phosphorus 4.0  2.3 - 4.6 (mg/dL)   Albumin 1.7 (*) 3.5 - 5.2 (g/dL)   GFR calc non Af Amer 18 (*) >90 (mL/min)   GFR calc Af Amer 21 (*) >90 (mL/min)  CBC     Status: Abnormal   Collection Time   11/19/11  6:05 AM      Component Value Range   WBC 6.6  4.0 - 10.5 (K/uL)   RBC 3.51 (*) 3.87 - 5.11 (MIL/uL)   Hemoglobin 9.7 (*) 12.0 - 15.0 (g/dL)   HCT 30.0 (*) 36.0 - 46.0 (%)   MCV 85.5  78.0 - 100.0 (fL)   MCH 27.6  26.0 - 34.0 (pg)   MCHC 32.3  30.0 - 36.0 (g/dL)   RDW 18.3 (*) 11.5 - 15.5 (%)   Platelets 320  150 - 400 (K/uL)  GLUCOSE, CAPILLARY     Status: Abnormal   Collection Time   11/19/11  7:43 AM      Component Value Range  Glucose-Capillary 160 (*) 70 - 99 (mg/dL)   Comment 1 Documented in Chart     Comment 2 Notify RN    GLUCOSE, CAPILLARY     Status: Abnormal   Collection Time   11/19/11 12:00 PM      Component Value Range   Glucose-Capillary 169 (*) 70 - 99 (mg/dL)   Comment 1 Documented in Chart     Comment 2 Notify RN      Micro Results: Recent Results (from the past 240 hour(s))  MRSA PCR SCREENING     Status: Normal   Collection Time   11/11/11  2:39 PM      Component Value Range Status Comment   MRSA by PCR NEGATIVE  NEGATIVE  Final   URINE CULTURE     Status: Normal   Collection Time   11/12/11 11:00 PM      Component Value Range Status Comment   Specimen Description URINE, CLEAN CATCH   Final    Special Requests NONE   Final    Setup Time [id number]   Final    Colony Count 35,000 COLONIES/ML   Final    Culture     Final    Value: VANCOMYCIN RESISTANT ENTEROCOCCUS ISOLATED     Note: CRITICAL RESULT CALLED TO, READ BACK BY AND VERIFIED WITH: Leeroy Cha @ [id number] BY BOVET 11/17/11   Report Status 11/17/2011 FINAL   Final    Organism ID, Bacteria VANCOMYCIN RESISTANT ENTEROCOCCUS ISOLATED   Final    Studies/Results: No results found. Medications:reviewed.  Scheduled Meds:   . amLODipine  10 mg Oral Daily  . feeding supplement  237 mL Oral TID WC  . Flora-Q  1 capsule Oral Daily   . heparin subcutaneous  5,000 Units Subcutaneous Q8H  . insulin aspart  0-20 Units Subcutaneous TID WC  . insulin aspart  0-5 Units Subcutaneous QHS  . insulin aspart  5 Units Subcutaneous TID WC  . insulin glargine  10 Units Subcutaneous Daily  . metoCLOPramide (REGLAN) injection  5 mg Intravenous TID WC & HS  . metoprolol tartrate  50 mg Oral BID  . ondansetron  4 mg Intravenous QID  . pantoprazole  40 mg Oral BID AC  . torsemide  20 mg Intravenous Q6H   Continuous Infusions:   . sodium chloride    . DISCONTD: sodium chloride 120 mL/hr (11/18/11 2135)  . DISCONTD: sodium chloride     PRN Meds:.acetaminophen, acetaminophen, hydrALAZINE, ipratropium, levalbuterol, oxyCODONE-acetaminophen, phenol, senna-docusate, sodium chloride, zolpidem Assessment/Plan:  LE cellulitis:  resolved, patient has significant lymphedema and morbid obesity.  Acute renal failure/dehydration  - Dehydration resolved  - vasculitis work up, ANA positive , ANCA, complement levels wnl , CRP elevated ( 0.63). ESR elevated. Elevated LDH ( 466) , pos rh factor ( 16)   , renal ultrasound was done on 11/10/2011 which showed hepatomegaly with hepatic steatosis, slight increased echogenicity of the right renal parenchyma suggestive of renal medical disease.  -appreciate renal recs. Likely in the setting of infection vs AIN from meds.   Anemia with nausea  - FOBT positive, pt seen by GI at [facility] and they have started her on Reglan for nausea  - nausea and vomiting was thought to be secondary to gastroparesis, gastric emptying study pending  -seen by GI in [place], recommended PPI bid and follow up at [facility] for EGD  Diabetes mellitus :  Uncontrolled, HbA1c 10.2 on 11/11/2011  - Blood sugars controlled inpatient, Continue sliding scale insulin and  Lantus  Morbid obesity:  BMI over 40  Transaminitis  - Likely secondary to hepatic steatosis, hepatitis panel negative  Lymphedema of lower extremity  - severe and  chronic, per pt's report now at her baseline  HTN (hypertension)  controlled  Cont metoprolol  And amlodipine  Cont prn hydralazine  HLD (hyperlipidemia)  - chronic, no statins secondary to tramsamnitis   VRE in urine ( 35,000): aysmptomatic, no need for treatment. Confirmed with Dr Baxter Flattery.   pending issues  Monitor improvement in renal function for the next 24 hours on torsemide iv for 4 doses. .  Dispo:  home with services          LOS: 15 days   Azelyn Batie 11/19/2011, 3:14 PM

## 2011-11-19 NOTE — Progress Notes (Signed)
PT NOTE: Spoke with RN about ordering a Bariatric recliner for [name].  Current chair unable to support weight of pt's LEs .    Bernie Ransford L. Caylin Nass DPT   [phone] 11/19/2011

## 2011-11-19 NOTE — Progress Notes (Signed)
Subjective: Wants to go home.  Feels well.  Says hands are more swollen with IV NS.  Say torsemide works better for her than furosemide  Objective: Vital signs in last 24 hours: Blood pressure 133/84, pulse 84, temperature 98.2 F (36.8 C), temperature source Oral, resp. rate 20, height 5' 5" (1.651 m), weight 175.5 kg (386 lb 14.5 oz), SpO2 97.00%.   Intake/Output from previous day: 11/29 0701 - 11/30 0700 In: 2741.7 [P.O.:960; I.V.:1781.7] Out: 625 [Urine:625] Intake/Output this shift: Total I/O In: 120 [P.O.:120] Out: -  Wt Readings from Last 3 Encounters:  11/18/11 175.5 kg (386 lb 14.5 oz)  10/26/11 149.687 kg (330 lb)     PHYSICAL EXAM General--Awake, alert, frustrated Chest--clear           Heart--no rub Abd--nontender Extr--lymphedema, dorsum of hands edematous  Lab Results:   Lab 11/19/11 0605 11/18/11 0553 11/17/11 0235  NA 143 141 139  K 4.6 4.3 4.4  CL 110 108 107  CO2 _0 BUN 27* 23 22  CREATININE 2.95* 2.70* 2.62*  EGFR -- -- --  GLUCOSE 183* -- --  CALCIUM 8.4 8.2* 7.9*  PHOS 4.0 4.0 3.9    VRE NOTED IN URINE  Basename 11/19/11 0605 11/18/11 0553  WBC 6.6 6.4  HGB 9.7* 10.5*  HCT 30.0* 33.0*  PLT 320 315    Scheduled: Continuous:  Assessment/Plan: 1. Acute Kidney injury ?etiol.. Cr started to decrease but now increasing.  Trial of IV NS didn't help.  Will Rx torsemide 20 mg iv q 6 hr for 4 doses 2. High BP On amlodpine and hydralazine, metoprolol, plus diuretic now Avoid ACE inhib/ARBs for now  3. DM--per primary svc 4.  Should rx VRE (? linezolid)    LOS: 15 days   Tevis Dunavan F 11/19/2011,1:40 PM

## 2011-11-19 NOTE — Progress Notes (Signed)
Physical Therapy Treatment Patient Details Name: [name] MRN: [id number] DOB: Apr 04, 1966 Today's Date: 11/19/2011  PT Assessment/Plan  PT - Assessment/Plan Comments on Treatment Session: pt mobility improving with exposure.  Pt left in Recliner. unable to elevate LEs with recliner secondary to LEs  too heavy.  Elevated  LEs on 3in1.  Instructed pt to inform RN if LE pain or discomfort  occur.   PT Plan: Discharge plan remains appropriate PT Frequency: Min 2X/week Follow Up Recommendations: Inpatient Rehab Equipment Recommended: Defer to next venue PT Goals  Acute Rehab PT Goals PT Goal Formulation: With patient Pt will Transfer Bed to Chair/Chair to Bed: with modified independence PT Transfer Goal: Bed to Chair/Chair to Bed - Progress: Revised (modified due to lack of progress/goal met) Pt will Ambulate: with rolling walker;16 - 50 feet;with modified independence PT Goal: Ambulate - Progress: Revised (modified due to lack of progress/goal met)  PT Treatment Precautions/Restrictions  Precautions Precautions: Fall Restrictions Weight Bearing Restrictions: No Mobility (including Balance) Bed Mobility Bed Mobility: No Rolling Left: Not tested (comment) (pt reports getting oob without assistance.) Left Sidelying to Sit: Not tested (comment) Supine to Sit: Not tested (comment) Sit to Supine - Left: Not tested (comment) Transfers Transfers: Yes Sit to Stand: 4: Min assist;From chair/3-in-1;With upper extremity assist (pt 90%) Sit to Stand Details (indicate cue type and reason): cues for hand placement on armrest of the chair.   Stand to Sit: 5: Supervision;To chair/3-in-1;With upper extremity assist;With armrests Stand to Sit Details: verbal cues for safe technique.  Ambulation/Gait Ambulation/Gait: Yes Ambulation/Gait Assistance: 5: Supervision Ambulation/Gait Assistance Details (indicate cue type and reason): verbal cues and assist to maintain safe distance from walker  pt unable to stay within walker secondary to excessive width of her LEs.   Ambulation Distance (Feet): 25 Feet (two standing rest breaks. ) Assistive device: Rolling walker (2 standing rest breaks.  ) Gait Pattern: Step-through pattern;Decreased stride length;Trunk flexed Stairs: No Wheelchair Mobility Wheelchair Mobility: No  Posture/Postural Control Posture/Postural Control: No significant limitations Balance Balance Assessed: No Exercise    End of Session PT - End of Session Activity Tolerance: Patient limited by fatigue Patient left: in chair;with bed alarm set;with family/visitor present Nurse Communication: Mobility status for transfers;Mobility status for ambulation General Behavior During Session: [facility] for tasks performed Cognition: [facility] for tasks performed  Melitta Tigue L. Jaquaya Coyle DPT [id number] Nelwyn [place]is[place] 11/19/2011, 4:55 PM

## 2011-11-20 LAB — GLUCOSE, CAPILLARY
Glucose-Capillary: 178 mg/dL — ABNORMAL HIGH (ref 70–99)
Glucose-Capillary: 259 mg/dL — ABNORMAL HIGH (ref 70–99)
Glucose-Capillary: 158 mg/dL — ABNORMAL HIGH (ref 70–99)

## 2011-11-20 LAB — RENAL FUNCTION PANEL
CO2: 25 mEq/L (ref 19–32)
Calcium: 8 mg/dL — ABNORMAL LOW (ref 8.4–10.5)
Chloride: 110 mEq/L (ref 96–112)
GFR calc Af Amer: 21 mL/min — ABNORMAL LOW (ref >90)
GFR calc non Af Amer: 18 mL/min — ABNORMAL LOW (ref >90)
Glucose, Bld: 149 mg/dL — ABNORMAL HIGH (ref 70–99)
Sodium: 142 mEq/L (ref 135–145)

## 2011-11-20 MED ORDER — TORSEMIDE 50 MG/5ML IV SOLN: 20.0000 mg | Freq: Four times a day (QID) | INTRAVENOUS | Status: DC

## 2011-11-20 MED ORDER — FUROSEMIDE 10 MG/ML IJ SOLN
40.0000 mg | Freq: Four times a day (QID) | INTRAMUSCULAR | Status: AC
  Administered 2011-11-20 – 2011-11-21 (×4): 40 mg via INTRAVENOUS
  Filled 2011-11-20 (×3): qty 4

## 2011-11-20 NOTE — Progress Notes (Signed)
Subjective: In good spirits. Understands why she has to stay in the hospital. Objective: Vital signs in last 24 hours: Filed Vitals:   11/20/11 0554 11/20/11 1006 11/20/11 1341 11/20/11 1652  BP: 136/75 157/99 162/82 136/86  Pulse: 81 87 77 77  Temp: 98 F (36.7 C) 98 F (36.7 C) 98.3 F (36.8 C) 97.8 F (36.6 C)  TempSrc: Oral Oral Oral Oral  Resp: _0 Height:      Weight:      SpO2: 94% 95% 97% 95%   Weight change:   Intake/Output Summary (Last 24 hours) at 11/20/11 1832 Last data filed at 11/20/11 1500  Gross per 24 hour  Intake   1115 ml  Output   1700 ml  Net   -585 ml   Physical Exam: Alert afebril comfortable cvs s1 and s2 heard Lungs clear abd soft and non tender, bowel sounds heard Ext 4+ chrnic lymphadema.  Neuro: no focal deficits.  Lab Results: Results for orders placed during the hospital encounter of 11/04/11 (from the past 24 hour(s))  RENAL FUNCTION PANEL     Status: Abnormal   Collection Time   11/20/11  5:00 AM      Component Value Range   Sodium 142  135 - 145 (mEq/L)   Potassium 4.3  3.5 - 5.1 (mEq/L)   Chloride 110  96 - 112 (mEq/L)   CO2 25  19 - 32 (mEq/L)   Glucose, Bld 149 (*) 70 - 99 (mg/dL)   BUN 25 (*) 6 - 23 (mg/dL)   Creatinine, Ser 2.91 (*) 0.50 - 1.10 (mg/dL)   Calcium 8.0 (*) 8.4 - 10.5 (mg/dL)   Phosphorus 4.2  2.3 - 4.6 (mg/dL)   Albumin 1.9 (*) 3.5 - 5.2 (g/dL)   GFR calc non Af Amer 18 (*) >90 (mL/min)   GFR calc Af Amer 21 (*) >90 (mL/min)  GLUCOSE, CAPILLARY     Status: Abnormal   Collection Time   11/20/11  8:18 AM      Component Value Range   Glucose-Capillary 178 (*) 70 - 99 (mg/dL)  GLUCOSE, CAPILLARY     Status: Abnormal   Collection Time   11/20/11 12:10 PM      Component Value Range   Glucose-Capillary 259 (*) 70 - 99 (mg/dL)  GLUCOSE, CAPILLARY     Status: Abnormal   Collection Time   11/20/11  4:39 PM      Component Value Range   Glucose-Capillary 179 (*) 70 - 99 (mg/dL)   Comment 1 Documented in  Chart     Comment 2 Notify RN    GLUCOSE, CAPILLARY     Status: Abnormal   Collection Time   11/20/11  8:55 PM      Component Value Range   Glucose-Capillary 158 (*) 70 - 99 (mg/dL)    Micro Results: Recent Results (from the past 240 hour(s))  MRSA PCR SCREENING     Status: Normal   Collection Time   11/11/11  2:39 PM      Component Value Range Status Comment   MRSA by PCR NEGATIVE  NEGATIVE  Final   URINE CULTURE     Status: Normal   Collection Time   11/12/11 11:00 PM      Component Value Range Status Comment   Specimen Description URINE, CLEAN CATCH   Final    Special Requests NONE   Final    Setup Time [id number]   Final    Colony Count  35,000 COLONIES/ML   Final    Culture     Final    Value: VANCOMYCIN RESISTANT ENTEROCOCCUS ISOLATED     Note: CRITICAL RESULT CALLED TO, READ BACK BY AND VERIFIED WITH: Leeroy Cha @ [id number] BY BOVET 11/17/11   Report Status 11/17/2011 FINAL   Final    Organism ID, Bacteria VANCOMYCIN RESISTANT ENTEROCOCCUS ISOLATED   Final    Studies/Results: No results found. Medications:  Scheduled Meds:   . amLODipine  10 mg Oral Daily  . feeding supplement  237 mL Oral TID WC  . Flora-Q  1 capsule Oral Daily  . furosemide  40 mg Intravenous Q6H  . furosemide  80 mg Intravenous Q6H  . heparin subcutaneous  5,000 Units Subcutaneous Q8H  . insulin aspart  0-20 Units Subcutaneous TID WC  . insulin aspart  0-5 Units Subcutaneous QHS  . insulin aspart  5 Units Subcutaneous TID WC  . insulin glargine  10 Units Subcutaneous Daily  . metoCLOPramide (REGLAN) injection  5 mg Intravenous TID WC & HS  . metoprolol tartrate  50 mg Oral BID  . ondansetron  4 mg Intravenous QID  . pantoprazole  40 mg Oral BID AC  . DISCONTD: torsemide  20 mg Intravenous QID   Continuous Infusions:   . sodium chloride 20 mL/hr at 11/20/11 0700   PRN Meds:.acetaminophen, acetaminophen, hydrALAZINE, ipratropium, levalbuterol, oxyCODONE-acetaminophen, phenol, senna-docusate,  sodium chloride, zolpidem Assessment/Plan:    LE cellulitis:  resolved, patient has significant lymphedema and morbid obesity.  Acute renal failure/dehydration  - Dehydration resolved  - vasculitis work up, ANA positive , ANCA, complement levels wnl , CRP elevated ( 0.63). ESR elevated. Elevated LDH ( 466) , pos rh factor ( 16)  , renal ultrasound was done on 11/10/2011 which showed hepatomegaly with hepatic steatosis, slight increased echogenicity of the right renal parenchyma suggestive of renal medical disease.  -appreciate renal recs. Likely in the setting of infection vs AIN from meds.  Anemia with nausea  - FOBT positive, pt seen by GI at [facility] and they have started her on Reglan for nausea  - nausea and vomiting was thought to be secondary to gastroparesis, gastric emptying study pending  -seen by GI in [place], recommended PPI bid and follow up at [facility] for EGD  Diabetes mellitus :  Uncontrolled, HbA1c 10.2 on 11/11/2011  - Blood sugars controlled inpatient, Continue sliding scale insulin and Lantus  Morbid obesity:  BMI over 40  Transaminitis  - Likely secondary to hepatic steatosis, hepatitis panel negative  Lymphedema of lower extremity  - severe and chronic, per pt's report now at her baseline  HTN (hypertension)  controlled  Cont metoprolol And amlodipine  Cont prn hydralazine  HLD (hyperlipidemia)  - chronic, no statins secondary to tramsamnitis  VRE in urine ( 35,000): aysmptomatic, no need for treatment. Confirmed with Dr Baxter Flattery.   pending issues  Monitor improvement in renal function for the next 24 hours on torsemide iv .Marland Kitchen  Dispo:  home with services    LOS: 16 days   [name] 11/20/2011, 6:32 PM

## 2011-11-20 NOTE — Consult Note (Signed)
Subjective: Pt feeling good.  Eating well, no complaints. UOP 1075 yesterday on IV torsemide, a 24 hr trial.  Creatinine down slightly.   Objective Vital signs in last 24 hours: Filed Vitals:   11/19/11 2156 11/20/11 0554 11/20/11 1006 11/20/11 1341  BP: 143/88 136/75 157/99 162/82  Pulse: 91 81 87 77  Temp: 97.1 F (36.2 C) 98 F (36.7 C) 98 F (36.7 C) 98.3 F (36.8 C)  TempSrc: Oral Oral Oral Oral  Resp: _0 Height:      Weight:      SpO2: 99% 94% 95% 97%   Weight change:   Intake/Output Summary (Last 24 hours) at 11/20/11 1425 Last data filed at 11/20/11 1300  Gross per 24 hour  Intake    875 ml  Output   1625 ml  Net   -750 ml   Labs: Basic Metabolic Panel:  Lab 22/29/79 0500 11/19/11 0605 11/18/11 0553 11/17/11 0235 11/16/11 0530 11/15/11 0625 11/14/11 2200 11/14/11 0530  NA 142 143 141 139 140 140 138 --  K 4.3 4.6 4.3 4.4 4.5 4.3 4.3 --  CL 110 110 108 107 109 108 106 --  CO2 _1 --  GLUCOSE 149* 183* 141* 158* 123* 130* 128* --  BUN 25* 27* _2 24* 24* --  CREATININE 2.91* 2.95* 2.70* 2.62* 2.70* 2.66* 2.71* --  ALB -- -- -- -- -- -- -- --  CALCIUM 8.0* 8.4 8.2* 7.9* 7.9* 7.8* 8.1* --  PHOS 4.2 4.0 4.0 3.9 -- 4.0 -- 4.2   Liver Function Tests:  Lab 11/20/11 0500 11/19/11 0605 11/18/11 0553 11/14/11 2200 11/14/11 1830  AST -- -- -- 127* 122*  ALT -- -- -- 55* 55*  ALKPHOS -- -- -- 292* 291*  BILITOT -- -- -- 0.2* 0.2*  PROT -- -- -- 6.0 6.0  ALBUMIN 1.9* 1.7* 1.8* -- --   No results found for this basename: LIPASE:3,AMYLASE:3 in the last 168 hours No results found for this basename: AMMONIA:3 in the last 168 hours CBC:  Lab 11/19/11 0605 11/18/11 0553 11/17/11 0235 11/15/11 1420 11/14/11 2200  WBC 6.6 6.4 8.0 8.1 --  NEUTROABS -- -- -- -- 4.3  HGB 9.7* 10.5* 10.6* 11.2* --  HCT 30.0* 33.0* 31.8* 33.5* --  MCV 85.5 85.1 83.7 83.5 --  PLT 320 315 276 290 --   PT/INR: _3 (inr:5) Cardiac Enzymes:  Lab  11/17/11 0055 11/15/11 1420 11/15/11 0625 11/14/11 2048  CKTOTAL -- 39 34 40  CKMB -- 1.6 1.7 1.6  CKMBINDEX -- -- -- --  TROPONINI <0.30 <0.30 <0.30 <0.30   CBG:  Lab 11/20/11 1210 11/20/11 0818 11/19/11 2030 11/19/11 1625 11/19/11 1200  GLUCAP 259* 178* 186* 161* 169*    Iron Studies: No results found for this basename: IRON:30,TIBC:30,TRANSFERRIN:30,FERRITIN:30 in the last 168 hours Studies/Results: No results found. Medications:    . sodium chloride 20 mL/hr at 11/20/11 0700      . amLODipine  10 mg Oral Daily  . feeding supplement  237 mL Oral TID WC  . Flora-Q  1 capsule Oral Daily  . furosemide  80 mg Intravenous Q6H  . heparin subcutaneous  5,000 Units Subcutaneous Q8H  . insulin aspart  0-20 Units Subcutaneous TID WC  . insulin aspart  0-5 Units Subcutaneous QHS  . insulin aspart  5 Units Subcutaneous TID WC  . insulin glargine  10 Units Subcutaneous Daily  . metoCLOPramide (REGLAN) injection  5  mg Intravenous TID WC & HS  . metoprolol tartrate  50 mg Oral BID  . ondansetron  4 mg Intravenous QID  . pantoprazole  40 mg Oral BID AC  . DISCONTD: torsemide  20 mg Intravenous Q6H    I  have reviewed scheduled and prn medications.  Physical Exam: General: alert, obese, NAD Heart: regular w/o rub Lungs: CTA bilat Abdomen: obese, soft, NT Extremities: marked obesity with pannicular folds about the lower and upper legs.  Not a lot of extra edema fluid.  Neuro: alert, 0x3  Problem/Plan: 1. AKI - uncertain cause, in setting of cellulitis of LE.  Cellulitis resolved, creat in high 2 range.  Trial of torsemide yesterday.  Creat down slightly.  Check again in am.  2. Chronic lymphedema of LE's with cellulitis, resolved.  3. Morbid obesity 4. Lytes/Nutrition - stable.     Bier D  11/20/2011,2:25 PM  LOS: 16 days  Cell #  [phone]

## 2011-11-21 LAB — RENAL FUNCTION PANEL
BUN: 24 mg/dL — ABNORMAL HIGH (ref 6–23)
CO2: 24 mEq/L (ref 19–32)
Chloride: 105 mEq/L (ref 96–112)
Creatinine, Ser: 2.75 mg/dL — ABNORMAL HIGH (ref 0.50–1.10)
GFR calc non Af Amer: 20 mL/min — ABNORMAL LOW (ref >90)
Glucose, Bld: 175 mg/dL — ABNORMAL HIGH (ref 70–99)

## 2011-11-21 LAB — GLUCOSE, CAPILLARY: Glucose-Capillary: 223 mg/dL — ABNORMAL HIGH (ref 70–99)

## 2011-11-21 LAB — CBC
HCT: 30.1 % — ABNORMAL LOW (ref 36.0–46.0)
Hemoglobin: 9.8 g/dL — ABNORMAL LOW (ref 12.0–15.0)
MCH: 27.8 pg (ref 26.0–34.0)
RBC: 3.53 MIL/uL — ABNORMAL LOW (ref 3.87–5.11)

## 2011-11-21 MED ORDER — TORSEMIDE 20 MG PO TABS
50.0000 mg | ORAL_TABLET | Freq: Two times a day (BID) | ORAL | Status: DC
  Filled 2011-11-21 (×2): qty 1

## 2011-11-21 NOTE — Consult Note (Signed)
Subjective: Pt feeling good.  Eating well, no complaints.  I/O were 1880 in and 2575 out.   Objective Vital signs in last 24 hours: Filed Vitals:   11/21/11 0540 11/21/11 0900 11/21/11 1300 11/21/11 1700  BP: 137/84 147/91 142/86 162/91  Pulse: 75 92 84 83  Temp: 97.5 F (36.4 C) 98.3 F (36.8 C) 98.1 F (36.7 C) 98.5 F (36.9 C)  TempSrc: Oral Oral Oral Oral  Resp: _0 Height:      Weight:      SpO2: 93% 94% 94% 95%   Weight change:   Intake/Output Summary (Last 24 hours) at 11/21/11 1954 Last data filed at 11/21/11 1900  Gross per 24 hour  Intake 1174.67 ml  Output   3225 ml  Net -2050.33 ml   Labs: Basic Metabolic Panel:  Lab 06/00/45 0500 11/20/11 0500 11/19/11 0605 11/18/11 0553 11/17/11 0235 11/16/11 0530 11/15/11 0625  NA 138 142 143 141 139 140 140  K 4.1 4.3 4.6 4.3 4.4 4.5 4.3  CL 105 110 110 108 107 109 108  CO2 _1 GLUCOSE 175* 149* 183* 141* 158* 123* 130*  BUN 24* 25* 27* _2 24*  CREATININE 2.75* 2.91* 2.95* 2.70* 2.62* 2.70* 2.66*  ALB -- -- -- -- -- -- --  CALCIUM 8.0* 8.0* 8.4 8.2* 7.9* 7.9* 7.8*  PHOS 4.2 4.2 4.0 4.0 3.9 -- 4.0   Liver Function Tests:  Lab 11/21/11 0500 11/20/11 0500 11/19/11 0605 11/14/11 2200  AST -- -- -- 127*  ALT -- -- -- 55*  ALKPHOS -- -- -- 292*  BILITOT -- -- -- 0.2*  PROT -- -- -- 6.0  ALBUMIN 2.0* 1.9* 1.7* --   No results found for this basename: LIPASE:3,AMYLASE:3 in the last 168 hours No results found for this basename: AMMONIA:3 in the last 168 hours CBC:  Lab 11/21/11 0500 11/19/11 0605 11/18/11 0553 11/17/11 0235 11/14/11 2200  WBC 9.1 6.6 6.4 8.0 --  NEUTROABS -- -- -- -- 4.3  HGB 9.8* 9.7* 10.5* 10.6* --  HCT 30.1* 30.0* 33.0* 31.8* --  MCV 85.3 85.5 85.1 83.7 --  PLT 322 320 315 276 --   PT/INR: _3 (inr:5) Cardiac Enzymes:  Lab 11/17/11 0055 11/15/11 1420 11/15/11 0625 11/14/11 2048  CKTOTAL -- 39 34 40  CKMB -- 1.6 1.7 1.6  CKMBINDEX -- -- -- --    TROPONINI <0.30 <0.30 <0.30 <0.30   CBG:  Lab 11/21/11 1657 11/21/11 1214 11/21/11 0739 11/20/11 2055 11/20/11 1639  GLUCAP 223* 207* 152* 158* 179*    Iron Studies: No results found for this basename: IRON:30,TIBC:30,TRANSFERRIN:30,FERRITIN:30 in the last 168 hours Studies/Results: No results found. Medications:    . sodium chloride 1,000 mL (11/21/11 0707)      . amLODipine  10 mg Oral Daily  . feeding supplement  237 mL Oral TID WC  . Flora-Q  1 capsule Oral Daily  . furosemide  40 mg Intravenous Q6H  . heparin subcutaneous  5,000 Units Subcutaneous Q8H  . insulin aspart  0-20 Units Subcutaneous TID WC  . insulin aspart  0-5 Units Subcutaneous QHS  . insulin aspart  5 Units Subcutaneous TID WC  . insulin glargine  10 Units Subcutaneous Daily  . metoCLOPramide (REGLAN) injection  5 mg Intravenous TID WC & HS  . metoprolol tartrate  50 mg Oral BID  . ondansetron  4 mg Intravenous QID  . pantoprazole  40 mg Oral BID  AC    I  have reviewed scheduled and prn medications.  Physical Exam: General: alert, obese, NAD Heart: regular w/o rub Lungs: CTA bilat Abdomen: obese, soft, NT Extremities: marked obesity with pannicular folds about the lower and upper legs.  Not a lot of extra edema fluid.  Neuro: alert, 0x3  Problem/Plan: 1. AKI - uncertain cause, in setting of cellulitis of LE.  Cellulitis resolved, creat improving, down to 2.75 today. Recommend keeping until creatinine a little closer to baseline.  Will resume pt's home dose of diuretic which was torsemide 50 mg po bid. 2. Chronic lymphedema of LE's with acute cellulitis, resolved.  3. Morbid obesity 4. Lytes/Nutrition - stable.  5. CKD - Creat 0.77 with >300 protein in Oct 2011.  Creat 1.15 on 11/04/2011.  [place] here R kidney 15 cm, L kidney 11.8 cm with suboptimal evaluation.    Galatia D  11/21/2011,7:54 PM  LOS: 17 days  Cell #  [phone]

## 2011-11-22 LAB — RENAL FUNCTION PANEL
CO2: 26 mEq/L (ref 19–32)
Calcium: 7.9 mg/dL — ABNORMAL LOW (ref 8.4–10.5)
GFR calc Af Amer: 25 mL/min — ABNORMAL LOW (ref >90)
GFR calc non Af Amer: 21 mL/min — ABNORMAL LOW (ref >90)
Phosphorus: 4.1 mg/dL (ref 2.3–4.6)
Potassium: 4 mEq/L (ref 3.5–5.1)
Sodium: 140 mEq/L (ref 135–145)

## 2011-11-22 MED ORDER — POTASSIUM CHLORIDE CRYS ER 20 MEQ PO TBCR: 20.0000 meq | EXTENDED_RELEASE_TABLET | Freq: Three times a day (TID) | ORAL | Status: DC

## 2011-11-22 MED ORDER — METOPROLOL TARTRATE 50 MG PO TABS: 50.0000 mg | ORAL_TABLET | Freq: Two times a day (BID) | ORAL | Status: DC

## 2011-11-22 MED ORDER — AMLODIPINE BESYLATE 10 MG PO TABS: 10.0000 mg | ORAL_TABLET | Freq: Every day | ORAL | Status: DC

## 2011-11-22 MED ORDER — PANTOPRAZOLE SODIUM 40 MG PO TBEC: 40.0000 mg | DELAYED_RELEASE_TABLET | Freq: Two times a day (BID) | ORAL | Status: DC

## 2011-11-22 NOTE — Progress Notes (Signed)
Subjective: Interval History:Pt feeling good. Eating well, no complaints    Objective: Vital signs in last 24 hours:  Temp:  [98.1 F (36.7 C)-98.5 F (36.9 C)] 98.5 F (36.9 C) (12/02 1700) Pulse Rate:  [74-84] 74  (12/02 2100) Resp:  [19-20] 20  (12/02 2100) BP: (133-162)/(61-91) 133/61 mmHg (12/02 2100) SpO2:  [90 %-95 %] 90 % (12/02 2100) Weight:  [149.7 kg (330 lb 0.5 oz)] 330 lb 0.5 oz (149.7 kg) (12/02 2100)  Weight change:   Intake/Output: I/O last 3 completed shifts: In: 1174.7 [P.O.:960; I.V.:214.7] Out: 3525 [Urine:3525]   Intake/Output this shift:     General: alert, obese, NAD  Heart: regular w/o rub  Lungs: CTA bilat  Abdomen: obese, soft, NT  Extremities: marked obesity with pannicular folds about the lower and upper legs. Not a lot of extra edema fluid.  Neuro: alert, 0x3   Lab Results:  Basename 11/21/11 0500  WBC 9.1  HGB 9.8*  HCT 30.1*  PLT 322   BMET  Basename 11/22/11 0500 11/21/11 0500 11/20/11 0500  NA 140 138 142  K 4.0 4.1 4.3  CL 106 105 110  CO2 _0 GLUCOSE 129* 175* 149*  BUN 23 24* 25*  CREATININE 2.59* 2.75* 2.91*  CALCIUM 7.9* 8.0* 8.0*  PHOS 4.1 4.2 4.2   LFT  Basename 11/22/11 0500  PROT --  ALBUMIN 1.8*  AST --  ALT --  ALKPHOS --  BILITOT --  BILIDIR --  IBILI --   PT/INR No results found for this basename: LABPROT:2,INR:2 in the last 72 hours Hepatitis Panel No results found for this basename: HEPBSAG,HCVAB,HEPAIGM,HEPBIGM in the last 72 hours  Studies/Results: No results found.  I have reviewed the patient's current medications.  Assessment/Plan: 1. AKI - uncertain cause, in setting of cellulitis of LE. Cellulitis resolved, creat improving, down to 2.75 today. Recommend keeping until creatinine a little closer to baseline. Will resume pt's home dose of diuretic which was torsemide 50 mg po bid.  2. Chronic lymphedema of LE's with acute cellulitis, resolved.  3. Morbid obesity  4.  Lytes/Nutrition - stable.  5. CKD - Creat 0.77 with >300 protein in Oct 2011. Creat 1.15 on 11/04/2011. [place] here R kidney 15 cm, L kidney 11.8 cm with suboptimal evaluation.  Will sign off Follow up with Dr Hassell Done at [facility] in 3-4 weeks       LOS: 18 [place]le W _1 _2 :12 AM

## 2011-11-22 NOTE — Discharge Summary (Signed)
Physician Discharge Summary  Patient ID: [name] MRN: [id number] DOB/AGE: [birth year] 45 y.o.  Admit date: 11/04/2011 Discharge date: 11/22/2011  Primary Care Physician:  Alonza Bogus, MD   Discharge Diagnoses:    Principal Problem:  1.Metabolic Encephalopathy resolved.   2.chronic Lymphedema of lower extremity  3.Cellulitis of left leg resolved.  4.Diabetes mellitus  5.Morbid obesity  6.VRE in urine asymptomatic  7.Acute renal failure improving  8.Anemia  9.Hyperkalemia resolved.  Leukopenia  HTN (hypertension)  HLD (hyperlipidemia)  Transaminitis    Current Discharge Medication List    START taking these medications   Details  amLODipine (NORVASC) 10 MG tablet Take 1 tablet (10 mg total) by mouth daily. Qty: 30 tablet, Refills: 0    metoprolol (LOPRESSOR) 50 MG tablet Take 1 tablet (50 mg total) by mouth 2 (two) times daily. Qty: 60 tablet, Refills: 0    pantoprazole (PROTONIX) 40 MG tablet Take 1 tablet (40 mg total) by mouth 2 (two) times daily before a meal. Qty: 60 tablet, Refills: 0      CONTINUE these medications which have CHANGED   Details  potassium chloride SA (K-DUR,KLOR-CON) 20 MEQ tablet Take 1 tablet (20 mEq total) by mouth 3 (three) times daily. Take 20 mEq by mouth 2 (three) times daily. Qty: 60 tablet, Refills: 0      CONTINUE these medications which have NOT CHANGED   Details  Acetaminophen (RA FEVER REDUCER/PAIN RELIEVER PO) Take 1 tablet by mouth as needed. For fever     insulin lispro (HUMALOG) 100 UNIT/ML injection Inject 60 Units into the skin 2 (two) times daily.      oxyCODONE-acetaminophen (PERCOCET) 5-325 MG per tablet Take 1 tablet by mouth 4 (four) times daily as needed. For pain     torsemide (DEMADEX) 100 MG tablet Take 50 mg by mouth 2 (two) times daily.        STOP taking these medications     dextrose 5 % SOLN 50 mL with cefTRIAXone 1 G SOLR 1 g      heparin lock flush 100 UNIT/ML SOLN      sodium  chloride 0.9 % SOLN 500 mL with vancomycin 1000 MG SOLR 1,500 mg      sodium chloride 0.9 % SOLN 500 mL with vancomycin 1000 MG SOLR 1,500 mg          Disposition and Follow-up:  Discharge without services to follow with Dr. Hassell Done in one to 2 weeks  Consults:  #1 neurology consult   #2 ID consult  #3 a renal consult   Significant Diagnostic Studies:  Dg Chest Portable 1 View  11/04/2011  *RADIOLOGY REPORT*  Clinical Data: Cough, shortness of breath, fever  PORTABLE CHEST - 1 VIEW  Comparison: Chest x-ray of 10/28/2011  Findings: A right upper extremity PICC line is present with the tip seen to the lower SVC.  No pneumothorax is noted.  Cardiomegaly is stable.  No bony abnormality is seen other than degenerative change throughout the thoracic spine.  IMPRESSION: Right PICC line tip in lower SVC.  No pneumothorax.  Original Report Authenticated By: Joretta Bachelor, M.D.    Brief H and P: For complete details please refer to admission H and P, but in brief was transferred from [facility] for fever of unknown origin. 45 year old female with history of diabetes mellitus, coronary artery disease, chronic lymphedema, morbid obesity who was initially admitted to [facility] with lower extremity cellulitis. Patient was treated with IV vancomycin and  Zosyn and then subsequently discharged to home with IV Rocephin and vancomycin. Patient was readmitted on 11/04/2011 at [facility] with high-grade fevers and intermittent confusion. Blood cultures, urine cultures were all negative, C. difficile negative, CT of the chest was unremarkable except atelectasis. CT of the abdomen, pelvis showed some pelvic lymphadenopathy. Patient was subsequently transferred to [facility] on 11/11/2011 for further workup.    Hospital Course:   *Altered mental status/FUO:  Mental status significantly improved now and close to baseline  - Spinal tap under fluoroscopy done on 11/24 CSF study  unremarkable so far except for high opening pressure as per IR . final cx including cryptococcal ag negative - Unclear etiology at this time, possible multifactorial in etiology with differential including persistent fever, encephalopathy/meningitis (acute bacterial versus viral infection), initially started on IV acyclovir, but stopped as cultures are negative.  - Discussed in detail with neurology,CT head unremarkable. Neurology recommend neuropsych eval as outpt. No further recs  - Patient planned on an open MRI of the brain if workup negative. MRI is not feasible in-house due to weight issues. Patient however ahs been afebrile for past 3 days. LP negative except for high CSF pressure of 45. Cryptococcal ag negative.  EEG was done was negative for seizures. Neurology signed off.  Acute hypoxia on 11/25  Patient acutely hypoxic and tachycardic on 11/26th ABG reviewed. CXR shows increased vascular congestion. chest x-ray on 11/11/2011 which showed streaky bilateral opacities questioning pneumonia, patient had the CT of the chest on 11/06/2011 which had shown bilateral atelectasis. Patient had received IV broad-spectrum antibiotics at [facility], per ID recommendations, held off on any more antibioticsas it could be /resolving pneumonia or atelectasis.  VQ scan and doppler LE done on 11/26. Given large body habitus was not a clear conclusive study but there was o evidence of PE or DVT. ECHO this admission showed normal EF with gr 2 diastolic dysfunction.   Acute renal failure Patient had normal creatinine 2 weeks prior to admission. - vasculitis work up, ANA positive , ANCA, complement levels wnl , CRP elevated ( 0.63). ESR elevated. Elevated LDH ( 466) , pos rh factor ( 16)   renal ultrasound was done on 11/10/2011 which showed hepatomegaly with hepatic steatosis, slight increased echogenicity of the right renal parenchyma suggestive of renal medical disease.   Likely in the setting of  infection vs AIN from meds. Creatinine improving on torsemide and she is to his she is being discharged on torsemide 50 mg twice a day and recommended to follow up with Dr. Hassell Done in one to 2 weeks   LE cellulitis:  resolved, patient has significant chronic lymphedema and morbid obesity.   Anemia with nausea  - FOBT positive, pt seen by GI at [facility] and they have started her on Reglan for nausea  - nausea and vomiting was thought to be secondary to gastroparesis,. -seen by GI in [place], recommended PPI bid and follow up at [facility] for EGD  Diabetes mellitus :  Uncontrolled, HbA1c 10.2 on 11/11/2011  - Blood sugars controlled inpatient, Continue HOME INSULIN. Morbid obesity:  BMI over 40  Transaminitis  - Likely secondary to hepatic steatosis, hepatitis panel negative  Lymphedema of lower extremity  - severe and chronic, per pt's report now at her baseline  HTN (hypertension)  Controlled Continue with metoprolol and amlodipine  HLD (hyperlipidemia)  - chronic, no statins secondary to tramsamnitis        Time spent on Discharge: 54  MIN  Signed: Nneka Blanda 11/22/2011, 11:55 AM     `

## 2011-11-22 NOTE — Progress Notes (Signed)
Pt. discharged to home after d/c summary reviewed and pt capable of re verbalizing medications and follow up appointments. Pt remains stable. No signs and symptoms of distress. Educated to return to ER in the event of SOB, dizziness, chest pain, or fainting. Jobe Igo, RN

## 2011-11-22 NOTE — Progress Notes (Signed)
Subjective: Interval History: Pt feeling good. Eating well, no complaints.    Objective: Vital signs in last 24 hours:  Temp:  [98.1 F (36.7 C)-98.5 F (36.9 C)] 98.5 F (36.9 C) (12/02 1700) Pulse Rate:  [74-84] 74  (12/02 2100) Resp:  [19-20] 20  (12/02 2100) BP: (133-162)/(61-91) 133/61 mmHg (12/02 2100) SpO2:  [90 %-95 %] 90 % (12/02 2100) Weight:  [149.7 kg (330 lb 0.5 oz)] 330 lb 0.5 oz (149.7 kg) (12/02 2100)  Weight change:   Intake/Output: I/O last 3 completed shifts: In: 1174.7 [P.O.:960; I.V.:214.7] Out: 3525 [Urine:3525]   Intake/Output this shift:     General: alert, obese, NAD  Heart: regular w/o rub  Lungs: CTA bilat  Abdomen: obese, soft, NT  Extremities: marked obesity with pannicular folds about the lower and upper legs. Not a lot of extra edema fluid.  Neuro: alert, 0x3  Lab Results:  Basename 11/21/11 0500  WBC 9.1  HGB 9.8*  HCT 30.1*  PLT 322   BMET  Basename 11/22/11 0500 11/21/11 0500 11/20/11 0500  NA 140 138 142  K 4.0 4.1 4.3  CL 106 105 110  CO2 _0 GLUCOSE 129* 175* 149*  BUN 23 24* 25*  CREATININE 2.59* 2.75* 2.91*  CALCIUM 7.9* 8.0* 8.0*  PHOS 4.1 4.2 4.2   LFT  Basename 11/22/11 0500  PROT --  ALBUMIN 1.8*  AST --  ALT --  ALKPHOS --  BILITOT --  BILIDIR --  IBILI --   PT/INR No results found for this basename: LABPROT:2,INR:2 in the last 72 hours Hepatitis Panel No results found for this basename: HEPBSAG,HCVAB,HEPAIGM,HEPBIGM in the last 72 hours  Studies/Results: No results found.  I have reviewed the patient's current medications.  Assessment/Plan: 1. AKI - uncertain cause, in setting of cellulitis of LE. Cellulitis resolved, creat improving, down to 2.75 today. Recommend keeping until creatinine a little closer to baseline. Will resume pt's home dose of diuretic which was torsemide 50 mg po bid.  2. Chronic lymphedema of LE's with acute cellulitis, resolved.  3. Morbid obesity  4.  Lytes/Nutrition - stable.  5. CKD - Creat 0.77 with >300 protein in Oct 2011. Creat 1.15 on 11/04/2011. [place] here R kidney 15 cm, L kidney 11.8 cm with suboptimal evaluation.   PatientP     LOS: 52 Jerek Meulemans W _1 _2 :02 AM

## 2011-11-22 NOTE — Progress Notes (Signed)
Per MD order, PICC line removed. Cath intact at 41cm. Vaseline pressure gauze to site, pressure held x 12mn. No bleeding to site. Pt instructed to keep dressing CDI x 24 hours. Avoid heavy lifting, pushing or pulling x 24 hours,  If bleeding occurs hold pressure, if bleeding does not stop contact MD or go to the ED. Pt does not have any questions.  TCatalina Pizza

## 2011-11-22 NOTE — Progress Notes (Signed)
Pt discharged

## 2011-11-22 NOTE — Progress Notes (Deleted)
Pt discharged to home after d/c summary reviewed and pt capable of re verbalizing medications and follow up appointments. Pt remains stable. No signs and symptoms of distress. Educated to return to ER in the event of SOB, dizziness, chest pain, or fainting. Jobe Igo, RN

## 2011-11-22 NOTE — Progress Notes (Signed)
CARE MANAGEMENT NOTE 11/22/2011  Patient:  [name], [name]   Account Number:  [id number]  Date Initiated:  11/05/2011  Documentation initiated by:  Remo Lipps  Subjective/Objective Assessment:   45 yr old female admitted with vomiting recentlyd/c home on iv antx through [facility]  lives with her husband     Action/Plan:   pcp dr [name]   Anticipated DC Date:  11/17/2011   Anticipated DC Plan:  [place]  CM consult      [facility] Choice  Resumption Of Svcs/PTA Provider   Choice offered to / List presented to:          [facility] arranged  HH-1 RN  [place].   Status of service:  Completed, signed off  Discharge Disposition:  [place]  Comments:  11/22/11 [place] MSN CCM OK with renal to discharge, [place] liaison notified.  11/17/11 [place] MSN CCM D/C planned for today but urine culture + VRE.  ID to make recommendations.  Per pt, she has good family support from spouse as well as sons and sisters who all live nearby.  11/26 spoke w debbie at [facility], they are still seeing pt and will resume hhc services at [place], Mon[place] rn,bsn [phone]

## 2011-12-20 ENCOUNTER — Ambulatory Visit (HOSPITAL_COMMUNITY): Payer: PRIVATE HEALTH INSURANCE | Admitting: Physical Therapy

## 2011-12-22 ENCOUNTER — Ambulatory Visit (HOSPITAL_COMMUNITY): Payer: PRIVATE HEALTH INSURANCE | Admitting: Physical Therapy

## 2011-12-23 ENCOUNTER — Ambulatory Visit (HOSPITAL_COMMUNITY): Payer: PRIVATE HEALTH INSURANCE | Admitting: Physical Therapy

## 2011-12-27 ENCOUNTER — Ambulatory Visit (HOSPITAL_COMMUNITY): Payer: PRIVATE HEALTH INSURANCE | Admitting: Physical Therapy

## 2011-12-28 ENCOUNTER — Ambulatory Visit (HOSPITAL_COMMUNITY): Payer: PRIVATE HEALTH INSURANCE | Admitting: Physical Therapy

## 2011-12-29 ENCOUNTER — Ambulatory Visit (HOSPITAL_COMMUNITY): Payer: PRIVATE HEALTH INSURANCE | Admitting: Physical Therapy

## 2011-12-30 ENCOUNTER — Ambulatory Visit (HOSPITAL_COMMUNITY): Payer: PRIVATE HEALTH INSURANCE | Admitting: Physical Therapy

## 2012-02-05 ENCOUNTER — Emergency Department (HOSPITAL_COMMUNITY): Payer: No Typology Code available for payment source

## 2012-02-05 ENCOUNTER — Encounter (HOSPITAL_COMMUNITY): Payer: Self-pay | Admitting: *Deleted

## 2012-02-05 ENCOUNTER — Emergency Department (HOSPITAL_COMMUNITY)
Admission: EM | Admit: 2012-02-05 | Discharge: 2012-02-05 | Disposition: A | Discharge status: Home / Self Care | Payer: No Typology Code available for payment source | Attending: Emergency Medicine | Admitting: Emergency Medicine

## 2012-02-05 DIAGNOSIS — Z9079 Acquired absence of other genital organ(s): Secondary | ICD-10-CM | POA: Insufficient documentation

## 2012-02-05 DIAGNOSIS — Z794 Long term (current) use of insulin: Secondary | ICD-10-CM | POA: Insufficient documentation

## 2012-02-05 DIAGNOSIS — K5732 Diverticulitis of large intestine without perforation or abscess without bleeding: Secondary | ICD-10-CM | POA: Insufficient documentation

## 2012-02-05 DIAGNOSIS — I10 Essential (primary) hypertension: Secondary | ICD-10-CM | POA: Insufficient documentation

## 2012-02-05 DIAGNOSIS — I890 Lymphedema, not elsewhere classified: Secondary | ICD-10-CM | POA: Insufficient documentation

## 2012-02-05 DIAGNOSIS — I2510 Atherosclerotic heart disease of native coronary artery without angina pectoris: Secondary | ICD-10-CM | POA: Insufficient documentation

## 2012-02-05 DIAGNOSIS — J45909 Unspecified asthma, uncomplicated: Secondary | ICD-10-CM | POA: Insufficient documentation

## 2012-02-05 DIAGNOSIS — E7800 Pure hypercholesterolemia, unspecified: Secondary | ICD-10-CM | POA: Insufficient documentation

## 2012-02-05 DIAGNOSIS — K5792 Diverticulitis of intestine, part unspecified, without perforation or abscess without bleeding: Secondary | ICD-10-CM

## 2012-02-05 DIAGNOSIS — E119 Type 2 diabetes mellitus without complications: Secondary | ICD-10-CM | POA: Insufficient documentation

## 2012-02-05 DIAGNOSIS — G8929 Other chronic pain: Secondary | ICD-10-CM | POA: Insufficient documentation

## 2012-02-05 DIAGNOSIS — Z9889 Other specified postprocedural states: Secondary | ICD-10-CM | POA: Insufficient documentation

## 2012-02-05 LAB — COMPREHENSIVE METABOLIC PANEL
Albumin: 3.2 g/dL — ABNORMAL LOW (ref 3.5–5.2)
BUN: 19 mg/dL (ref 6–23)
Creatinine, Ser: 1.1 mg/dL (ref 0.50–1.10)
GFR calc Af Amer: 69 mL/min — ABNORMAL LOW (ref >90)
Total Bilirubin: 0.3 mg/dL (ref 0.3–1.2)
Total Protein: 7.3 g/dL (ref 6.0–8.3)

## 2012-02-05 LAB — DIFFERENTIAL
Basophils Relative: 0 % (ref 0–1)
Eosinophils Absolute: 0.3 10*3/uL (ref 0.0–0.7)
Eosinophils Relative: 3 % (ref 0–5)
Monocytes Absolute: 0.6 10*3/uL (ref 0.1–1.0)
Monocytes Relative: 7 % (ref 3–12)

## 2012-02-05 LAB — CBC
HCT: 30.8 % — ABNORMAL LOW (ref 36.0–46.0)
Hemoglobin: 10.3 g/dL — ABNORMAL LOW (ref 12.0–15.0)
MCH: 29.5 pg (ref 26.0–34.0)
MCHC: 33.4 g/dL (ref 30.0–36.0)

## 2012-02-05 MED ORDER — SODIUM CHLORIDE 0.9 % IV BOLUS (SEPSIS)
500.0000 mL | Freq: Once | INTRAVENOUS | Status: AC
  Administered 2012-02-05: 09:00:00 via INTRAVENOUS

## 2012-02-05 MED ORDER — HYDROMORPHONE HCL PF 1 MG/ML IJ SOLN
1.0000 mg | Freq: Once | INTRAMUSCULAR | Status: AC
  Administered 2012-02-05: 1 mg via INTRAVENOUS
  Filled 2012-02-05: qty 1

## 2012-02-05 MED ORDER — PROMETHAZINE HCL 25 MG PO TABS: 12.5000 mg | ORAL_TABLET | Freq: Four times a day (QID) | ORAL | Status: DC | PRN

## 2012-02-05 MED ORDER — CIPROFLOXACIN IN D5W 400 MG/200ML IV SOLN
400.0000 mg | Freq: Once | INTRAVENOUS | Status: AC
  Administered 2012-02-05: 400 mg via INTRAVENOUS
  Filled 2012-02-05: qty 200

## 2012-02-05 MED ORDER — ONDANSETRON HCL 4 MG/2ML IJ SOLN
4.0000 mg | Freq: Once | INTRAMUSCULAR | Status: AC
  Administered 2012-02-05: 4 mg via INTRAVENOUS
  Filled 2012-02-05: qty 2

## 2012-02-05 MED ORDER — CIPROFLOXACIN HCL 500 MG PO TABS: 500.0000 mg | ORAL_TABLET | Freq: Two times a day (BID) | ORAL | Status: AC

## 2012-02-05 MED ORDER — METRONIDAZOLE 500 MG PO TABS: 500.0000 mg | ORAL_TABLET | Freq: Two times a day (BID) | ORAL | Status: AC

## 2012-02-05 MED ORDER — METRONIDAZOLE 500 MG PO TABS
500.0000 mg | ORAL_TABLET | Freq: Once | ORAL | Status: AC
  Administered 2012-02-05: 500 mg via ORAL
  Filled 2012-02-05: qty 1

## 2012-02-05 MED ORDER — SODIUM CHLORIDE 0.9 % IV SOLN: Freq: Once | INTRAVENOUS | Status: DC

## 2012-02-05 MED ORDER — OXYCODONE-ACETAMINOPHEN 5-325 MG PO TABS: 2.0000 | ORAL_TABLET | ORAL | Status: AC | PRN

## 2012-02-05 NOTE — ED Notes (Signed)
Patient transported to CT 

## 2012-02-05 NOTE — Discharge Instructions (Signed)
Diverticulitis Small pockets or "bubbles" can develop in the wall of the intestine. Diverticulitis is when those pockets become infected and inflamed. This causes stomach pain (usually on the left side). HOME CARE  Take all medicine as told by your doctor.   Try a clear liquid diet (broth, tea, or water) for as long as told by your doctor.   Keep all follow-up visits with your doctor.   You may be put on a low-fiber diet once you start feeling better. Here are foods that have low-fiber:   White breads, cereals, rice, and pasta.   Cooked fruits and vegetables or soft fresh fruits and vegetables without the skin.   Ground or well-cooked tender beef, ham, veal, lamb, pork, or poultry.   Eggs and seafood.   After you are doing well on the low-fiber diet, you may be put on a high-fiber diet. Here are ways to increase your fiber:   Choose whole-grain breads, cereals, pasta, and brown rice.   Choose fruits and vegetables with skin on. Do not overcook the vegetables.   Choose nuts, seeds, legumes, dried peas, beans, and lentils.   Look for food products that have more than 3 grams of fiber per serving on the food label.  GET HELP RIGHT AWAY IF:  Your pain does not get better or gets worse.   You have trouble eating food.   You are not pooping (having bowel movements) like normal.   You have a temperature by mouth above 102 F (38.9 C), not controlled by medicine.   You keep throwing up (vomiting).   You have bloody or black, tarry poop (stools).   You are getting worse and not better.  MAKE SURE YOU:   Understand these instructions.   Will watch your condition.   Will get help right away if you are not doing well or get worse.  Document Released: 05/24/2008 Document Revised: 08/18/2011 Document Reviewed: 10/27/2009 [facility] Patient Information 2012 [place].

## 2012-02-05 NOTE — ED Notes (Signed)
States she has chronic kidney disease and may possibly have to have dialysis in the future

## 2012-02-05 NOTE — ED Notes (Signed)
Lymphedema noted in both legs

## 2012-02-05 NOTE — ED Provider Notes (Signed)
This chart was scribed for [employer]. Olin Hauser, MD by Toniann Ket. The patient was seen in room APA14/AP[facility]14 and the patient's care was started at 8:35 AM.   CSN: [id number]  Arrival date & time 02/05/12  0811   First MD Initiated Contact with Patient 02/05/12 [phone]      Chief Complaint  Patient presents with  . Flank Pain    (Consider location/radiation/quality/duration/timing/severity/associated sxs/prior treatment) HPI  [name] is a 46 y.o. female who presents to the Emergency Department complaining of sudden onset, intermittent, gradually worsening, moderate to severe left lower abdominal pain onset several days ago.  Pt describes pain as constant when it comes and pain radiates nowhere.  Pt c/o associated vomiting (2 days ago), nausea, chills, diarrhea, hematuria.  Denies bloody stool, fever. Pt last ate yesterday at 7 pm (salmon patty and eggs).  Pt w/ h/o kidney disease, diverticulosis, polyps. There are no other associated symptoms and no other alleviating or aggravating factors.    PCP: [name]  Past Medical History  Diagnosis Date  . Diabetes mellitus   . Chronic pain   . Lymph edema   . Cellulitis   . Chiari malformation     s/p surgery  . S/P colonoscopy 05/26/2007    Dr. Laural Golden sigmoid diverticulosis random biopsies benign  . CAD (coronary artery disease)   . Hypercholesterolemia   . Hypertension   . Asthma   . S/P endoscopy 05/01/2009    Dr. Penelope Coop pill-induced esophageal ulcerations distal to midesophagus, 2 small ulcers in the antrum of the stomach    Past Surgical History  Procedure Date  . Cesarean section      x 2  . Abdominal hysterectomy   . Tonsillectomy   . Adenoidectomy   . Thyroidectomy, partial   . Craniectomy suboccipital w/ cervical laminectomy / chiari     Family History  Problem Relation Age of Onset  . Colon cancer Mother 13  . Stroke Mother 55  . Coronary artery disease Mother     History  Substance Use Topics  .  Smoking status: Never Smoker   . Smokeless tobacco: Not on file  . Alcohol Use: No    OB History    Grav Para Term Preterm Abortions TAB SAB Ect Mult Living                  Review of Systems  10 Systems reviewed and are negative for acute change except as noted in the HPI.  Allergies  Contrast media and Pneumococcal vaccines  Home Medications   Current Outpatient Rx  Name Route Sig Dispense Refill  . RA FEVER REDUCER/PAIN RELIEVER PO Oral Take 1 tablet by mouth as needed. For fever     . AMLODIPINE BESYLATE 10 MG PO TABS Oral Take 1 tablet (10 mg total) by mouth daily. 30 tablet 0  . INSULIN LISPRO (HUMAN) 100 UNIT/ML [place] SOLN Subcutaneous Inject 60 Units into the skin 2 (two) times daily.      Marland Kitchen METOPROLOL TARTRATE 50 MG PO TABS Oral Take 1 tablet (50 mg total) by mouth 2 (two) times daily. 60 tablet 0  . OXYCODONE-ACETAMINOPHEN 5-325 MG PO TABS Oral Take 1 tablet by mouth 4 (four) times daily as needed. For pain     . PANTOPRAZOLE SODIUM 40 MG PO TBEC Oral Take 1 tablet (40 mg total) by mouth 2 (two) times daily before a meal. 60 tablet 0  . POTASSIUM CHLORIDE CRYS ER 20 MEQ PO TBCR Oral  Take 1 tablet (20 mEq total) by mouth 3 (three) times daily. Take 20 mEq by mouth 2 (three) times daily. 60 tablet 0  . TORSEMIDE 100 MG PO TABS Oral Take 50 mg by mouth 2 (two) times daily.        BP 194/111  Pulse 95  Temp 98 F (36.7 C)  Resp 23  Ht _0  (1.651 m)  Wt 335 lb (151.955 kg)  BMI 55.75 kg/m2  SpO2 98%  Physical Exam  Nursing note and vitals reviewed. Constitutional: She is oriented to person, place, and time. She appears well-developed and well-nourished. No distress.  HENT:  Head: Normocephalic and atraumatic.  Eyes: EOM are normal. Pupils are equal, round, and reactive to light.  Neck: Normal range of motion. Neck supple. No tracheal deviation present.  Cardiovascular: Normal rate, regular rhythm and normal heart sounds.   Pulmonary/Chest: Effort normal. No  respiratory distress. She has wheezes.       End wheezes bilaterally  Abdominal: Soft. Bowel sounds are normal. She exhibits no distension. There is tenderness.       Focal left lower abdominal tenderness with rebound  Musculoskeletal: Normal range of motion.       Marked lymphedema bilaterally, unable to take pt, dp  Neurological: She is alert and oriented to person, place, and time. No sensory deficit.  Skin: Skin is warm and dry.  Psychiatric: She has a normal mood and affect. Her behavior is normal.    ED Course  Procedures (including critical care time) DIAGNOSTIC STUDIES: Oxygen Saturation is 98% on room air, normal by my interpretation.    COORDINATION OF CARE:  1:04: EDP at bedside.  All results reviewed and discussed with pt, questions answered, pt agreeable with plan. Pt is feeling better after pain medication. Pt is ready to be discharged.    Results for orders placed during the hospital encounter of 02/05/12  CBC      Component Value Range   WBC 9.5  4.0 - 10.5 (K/uL)   RBC 3.49 (*) 3.87 - 5.11 (MIL/uL)   Hemoglobin 10.3 (*) 12.0 - 15.0 (g/dL)   HCT 30.8 (*) 36.0 - 46.0 (%)   MCV 88.3  78.0 - 100.0 (fL)   MCH 29.5  26.0 - 34.0 (pg)   MCHC 33.4  30.0 - 36.0 (g/dL)   RDW 15.5  11.5 - 15.5 (%)   Platelets 271  150 - 400 (K/uL)  DIFFERENTIAL      Component Value Range   Neutrophils Relative 73  43 - 77 (%)   Neutro Abs 6.9  1.7 - 7.7 (K/uL)   Lymphocytes Relative 17  12 - 46 (%)   Lymphs Abs 1.6  0.7 - 4.0 (K/uL)   Monocytes Relative 7  3 - 12 (%)   Monocytes Absolute 0.6  0.1 - 1.0 (K/uL)   Eosinophils Relative 3  0 - 5 (%)   Eosinophils Absolute 0.3  0.0 - 0.7 (K/uL)   Basophils Relative 0  0 - 1 (%)   Basophils Absolute 0.0  0.0 - 0.1 (K/uL)  COMPREHENSIVE METABOLIC PANEL      Component Value Range   Sodium 135  135 - 145 (mEq/L)   Potassium 4.1  3.5 - 5.1 (mEq/L)   Chloride 98  96 - 112 (mEq/L)   CO2 28  19 - 32 (mEq/L)   Glucose, Bld 303 (*) 70 - 99  (mg/dL)   BUN 19  6 - 23 (mg/dL)   Creatinine, Ser 1.10  0.50 - 1.10 (mg/dL)   Calcium 10.2  8.4 - 10.5 (mg/dL)   Total Protein 7.3  6.0 - 8.3 (g/dL)   Albumin 3.2 (*) 3.5 - 5.2 (g/dL)   AST 11  0 - 37 (U/L)   ALT 10  0 - 35 (U/L)   Alkaline Phosphatase 94  39 - 117 (U/L)   Total Bilirubin 0.3  0.3 - 1.2 (mg/dL)   GFR calc non Af Amer 60 (*) >90 (mL/min)   GFR calc Af Amer 69 (*) >90 (mL/min)    Ct Abdomen Pelvis Wo Contrast  02/05/2012  *RADIOLOGY REPORT*  Clinical Data: Abdominal pain all over for few days.  History thyroid cancer.  Hysterectomy.  Left lower quadrant pain.  Rule out diverticulitis.  Diabetes.  CT ABDOMEN AND PELVIS WITHOUT CONTRAST  Technique:  Multidetector CT imaging of the abdomen and pelvis was performed following the standard protocol without intravenous contrast.  Comparison: 11/06/2011  Findings: Clear lung bases.  Mild cardiomegaly without pericardial or pleural effusion.  Exam is again moderately degraded by patient body habitus and lack of IV contrast.  Hepatomegaly which is moderate to severe.  Normal spleen, stomach, pancreas, gallbladder, biliary tract, adrenal glands.  The right kidney is displaced inferiorly by the enlarged liver.  No focal renal abnormality, and no hydronephrosis.  Retroperitoneal adenopathy is improved d.  A left periaortic node measures 1.2 cm on image 38 versus 1.9 cm on the prior exam.  No retrocrural adenopathy.  Sigmoid diverticula.  Suspect uncomplicated descending/sigmoid junction diverticulitis, including on image 64 and image 61.  This area is especially degraded by patient's size. Colonic stool burden suggests constipation.  Terminal ileum and appendix not well evaluated.  No right lower quadrant inflammation identified.  Normal small bowel without abdominal ascites.    No pneumatosis or free intraperitoneal air.  Left inguinal node measures 1.4 cm on image 80 versus 1.5 cm on the prior. Left common iliac node measures 1.4 cm on image 58  versus 2.0 cm on the prior.  Normal urinary bladder.  Hysterectomy.  No adnexal mass or significant free pelvic fluid.  No acute osseous abnormality.  IMPRESSION: 1.  Mild to moderately degraded exam, secondary lack of IV contrast and patient body habitus. 2.  Descending/sigmoid junction diverticulitis, uncomplicated. 3.  Hepatomegaly. 4.  Improved abdominal pelvic adenopathy.  Presumably reactive.  Original Report Authenticated By: Areta Haber, M.D.     No diagnosis found.    MDM  Patient with LLQ abdominal pain associated with nausea, vomiting and diarrhea. Labs are unremarkable. CT positive for diverticulitis. Received IVF, analgesic and antiemetic with improvement. Initiated antibiotic therapy. Pt feels improved after observation and/or treatment in ED.Pt stable in ED with no significant deterioration in condition.The patient appears reasonably screened and/or stabilized for discharge and I doubt any other medical condition or other [facility] requiring further screening, evaluation, or treatment in the ED at this time prior to discharge.  I personally performed the services described in this documentation, which was scribed in my presence. The recorded information has been reviewed and considered.   MDM Reviewed: previous chart, nursing note and vitals Interpretation: labs and CT scan       Gypsy Balsam. Olin Hauser, MD 02/05/12 1343

## 2012-02-05 NOTE — ED Notes (Signed)
Pt c/o left flank pain x 1 week. Pt also c/o nausea, vomiting and diarrhea.

## 2012-03-08 ENCOUNTER — Encounter (HOSPITAL_COMMUNITY): Payer: Self-pay

## 2012-03-08 ENCOUNTER — Inpatient Hospital Stay (HOSPITAL_COMMUNITY)
Admission: EM | Admit: 2012-03-08 | Discharge: 2012-03-13 | DRG: 603 | Disposition: A | Discharge status: Home Health | Payer: Medicaid Other | Attending: Pulmonary Disease | Admitting: Pulmonary Disease

## 2012-03-08 DIAGNOSIS — I10 Essential (primary) hypertension: Secondary | ICD-10-CM | POA: Diagnosis present

## 2012-03-08 DIAGNOSIS — I890 Lymphedema, not elsewhere classified: Secondary | ICD-10-CM | POA: Diagnosis present

## 2012-03-08 DIAGNOSIS — L0390 Cellulitis, unspecified: Secondary | ICD-10-CM

## 2012-03-08 DIAGNOSIS — N179 Acute kidney failure, unspecified: Secondary | ICD-10-CM | POA: Diagnosis present

## 2012-03-08 DIAGNOSIS — L03119 Cellulitis of unspecified part of limb: Principal | ICD-10-CM | POA: Diagnosis present

## 2012-03-08 DIAGNOSIS — R112 Nausea with vomiting, unspecified: Secondary | ICD-10-CM | POA: Diagnosis not present

## 2012-03-08 DIAGNOSIS — Z794 Long term (current) use of insulin: Secondary | ICD-10-CM

## 2012-03-08 DIAGNOSIS — R51 Headache: Secondary | ICD-10-CM | POA: Diagnosis not present

## 2012-03-08 DIAGNOSIS — L03116 Cellulitis of left lower limb: Secondary | ICD-10-CM | POA: Diagnosis present

## 2012-03-08 DIAGNOSIS — T368X5A Adverse effect of other systemic antibiotics, initial encounter: Secondary | ICD-10-CM | POA: Diagnosis not present

## 2012-03-08 DIAGNOSIS — L02419 Cutaneous abscess of limb, unspecified: Principal | ICD-10-CM | POA: Diagnosis present

## 2012-03-08 DIAGNOSIS — E119 Type 2 diabetes mellitus without complications: Secondary | ICD-10-CM | POA: Diagnosis present

## 2012-03-08 DIAGNOSIS — D649 Anemia, unspecified: Secondary | ICD-10-CM | POA: Diagnosis present

## 2012-03-08 LAB — BASIC METABOLIC PANEL
CO2: 31 mEq/L (ref 19–32)
Calcium: 9.6 mg/dL (ref 8.4–10.5)
Chloride: 89 mEq/L — ABNORMAL LOW (ref 96–112)
GFR calc Af Amer: 55 mL/min — ABNORMAL LOW (ref >90)
Sodium: 132 mEq/L — ABNORMAL LOW (ref 135–145)

## 2012-03-08 LAB — CBC
MCV: 89.1 fL (ref 78.0–100.0)
Platelets: 263 10*3/uL (ref 150–400)
Platelets: 246 10*3/uL (ref 150–400)
RBC: 3.97 MIL/uL (ref 3.87–5.11)
RDW: 13.3 % (ref 11.5–15.5)
WBC: 6.7 10*3/uL (ref 4.0–10.5)
WBC: 7 10*3/uL (ref 4.0–10.5)

## 2012-03-08 LAB — DIFFERENTIAL
Basophils Absolute: 0 10*3/uL (ref 0.0–0.1)
Basophils Relative: 0 % (ref 0–1)
Eosinophils Absolute: 0.2 10*3/uL (ref 0.0–0.7)
Eosinophils Relative: 3 % (ref 0–5)
Lymphocytes Relative: 24 % (ref 12–46)
Lymphs Abs: 1.6 10*3/uL (ref 0.7–4.0)
Monocytes Absolute: 0.5 10*3/uL (ref 0.1–1.0)
Monocytes Relative: 8 % (ref 3–12)
Neutro Abs: 4.4 10*3/uL (ref 1.7–7.7)
Neutrophils Relative %: 65 % (ref 43–77)

## 2012-03-08 LAB — LACTIC ACID, PLASMA: Lactic Acid, Venous: 2 mmol/L (ref 0.5–2.2)

## 2012-03-08 LAB — COMPREHENSIVE METABOLIC PANEL
Albumin: 3.3 g/dL — ABNORMAL LOW (ref 3.5–5.2)
Alkaline Phosphatase: 99 U/L (ref 39–117)
BUN: 24 mg/dL — ABNORMAL HIGH (ref 6–23)
Chloride: 93 mEq/L — ABNORMAL LOW (ref 96–112)
Glucose, Bld: 344 mg/dL — ABNORMAL HIGH (ref 70–99)
Potassium: 4 mEq/L (ref 3.5–5.1)
Total Bilirubin: 0.2 mg/dL — ABNORMAL LOW (ref 0.3–1.2)

## 2012-03-08 LAB — URINALYSIS, ROUTINE W REFLEX MICROSCOPIC
Bilirubin Urine: NEGATIVE
Glucose, UA: 100 mg/dL — AB
Ketones, ur: NEGATIVE mg/dL
Leukocytes, UA: NEGATIVE
Nitrite: NEGATIVE
Protein, ur: 100 mg/dL — AB
Specific Gravity, Urine: 1.02 (ref 1.005–1.030)
Urobilinogen, UA: 0.2 mg/dL (ref 0.0–1.0)
pH: 6 (ref 5.0–8.0)

## 2012-03-08 LAB — HEMOGLOBIN A1C: Hgb A1c MFr Bld: 10.6 % — ABNORMAL HIGH (ref <5.7)

## 2012-03-08 LAB — GLUCOSE, CAPILLARY
Glucose-Capillary: 277 mg/dL — ABNORMAL HIGH (ref 70–99)
Glucose-Capillary: 194 mg/dL — ABNORMAL HIGH (ref 70–99)

## 2012-03-08 LAB — URINE MICROSCOPIC-ADD ON

## 2012-03-08 MED ORDER — HEPARIN SODIUM (PORCINE) 5000 UNIT/ML IJ SOLN
5000.0000 [IU] | Freq: Three times a day (TID) | INTRAMUSCULAR | Status: DC
  Administered 2012-03-08 – 2012-03-13 (×16): 5000 [IU] via SUBCUTANEOUS
  Filled 2012-03-08 (×16): qty 1

## 2012-03-08 MED ORDER — LEVOFLOXACIN IN D5W 750 MG/150ML IV SOLN
750.0000 mg | Freq: Once | INTRAVENOUS | Status: AC
  Administered 2012-03-08: 750 mg via INTRAVENOUS
  Filled 2012-03-08: qty 150

## 2012-03-08 MED ORDER — ONDANSETRON HCL 4 MG/2ML IJ SOLN
4.0000 mg | Freq: Once | INTRAMUSCULAR | Status: AC
  Administered 2012-03-08: 4 mg via INTRAVENOUS
  Filled 2012-03-08: qty 2

## 2012-03-08 MED ORDER — ONDANSETRON HCL 4 MG PO TABS: 4.0000 mg | ORAL_TABLET | Freq: Four times a day (QID) | ORAL | Status: DC | PRN

## 2012-03-08 MED ORDER — VANCOMYCIN HCL IN DEXTROSE 1-5 GM/200ML-% IV SOLN
1000.0000 mg | Freq: Three times a day (TID) | INTRAVENOUS | Status: DC
  Administered 2012-03-08 – 2012-03-10 (×5): 1000 mg via INTRAVENOUS
  Filled 2012-03-08 (×6): qty 200

## 2012-03-08 MED ORDER — LEVOFLOXACIN IN D5W 750 MG/150ML IV SOLN
750.0000 mg | INTRAVENOUS | Status: DC
  Administered 2012-03-09 – 2012-03-13 (×5): 750 mg via INTRAVENOUS
  Filled 2012-03-08 (×6): qty 150

## 2012-03-08 MED ORDER — SODIUM CHLORIDE 0.9 % IJ SOLN
3.0000 mL | Freq: Two times a day (BID) | INTRAMUSCULAR | Status: DC
  Filled 2012-03-08 (×2): qty 3

## 2012-03-08 MED ORDER — ONDANSETRON HCL 4 MG/2ML IJ SOLN
4.0000 mg | Freq: Four times a day (QID) | INTRAMUSCULAR | Status: DC | PRN
  Administered 2012-03-09 – 2012-03-13 (×8): 4 mg via INTRAVENOUS
  Filled 2012-03-08 (×8): qty 2

## 2012-03-08 MED ORDER — SODIUM CHLORIDE 0.9 % IV SOLN
INTRAVENOUS | Status: DC
  Administered 2012-03-08 – 2012-03-09 (×2): via INTRAVENOUS
  Administered 2012-03-09: 950 mL via INTRAVENOUS
  Administered 2012-03-10: 10:00:00 via INTRAVENOUS
  Administered 2012-03-10: 125 mL/h via INTRAVENOUS
  Administered 2012-03-10 – 2012-03-13 (×7): via INTRAVENOUS

## 2012-03-08 MED ORDER — INSULIN ASPART 100 UNIT/ML ~~LOC~~ SOLN
0.0000 [IU] | Freq: Three times a day (TID) | SUBCUTANEOUS | Status: DC
  Administered 2012-03-08 (×2): 8 [IU] via SUBCUTANEOUS
  Administered 2012-03-08 – 2012-03-09 (×3): 5 [IU] via SUBCUTANEOUS
  Administered 2012-03-09 – 2012-03-10 (×2): 3 [IU] via SUBCUTANEOUS
  Administered 2012-03-10: 2 [IU] via SUBCUTANEOUS
  Administered 2012-03-10: 3 [IU] via SUBCUTANEOUS
  Administered 2012-03-11: 2 [IU] via SUBCUTANEOUS
  Administered 2012-03-11 – 2012-03-12 (×5): 3 [IU] via SUBCUTANEOUS
  Administered 2012-03-13: 2 [IU] via SUBCUTANEOUS
  Administered 2012-03-13: 3 [IU] via SUBCUTANEOUS

## 2012-03-08 MED ORDER — VANCOMYCIN HCL IN DEXTROSE 1-5 GM/200ML-% IV SOLN
1000.0000 mg | Freq: Three times a day (TID) | INTRAVENOUS | Status: DC
  Administered 2012-03-08: 1000 mg via INTRAVENOUS
  Filled 2012-03-08 (×6): qty 200

## 2012-03-08 MED ORDER — VANCOMYCIN HCL IN DEXTROSE 1-5 GM/200ML-% IV SOLN
1000.0000 mg | Freq: Once | INTRAVENOUS | Status: DC
  Administered 2012-03-08: 1000 mg via INTRAVENOUS
  Filled 2012-03-08: qty 200

## 2012-03-08 MED ORDER — INSULIN NPH (HUMAN) (ISOPHANE) 100 UNIT/ML ~~LOC~~ SUSP
30.0000 [IU] | Freq: Two times a day (BID) | SUBCUTANEOUS | Status: DC
  Administered 2012-03-08 – 2012-03-13 (×11): 30 [IU] via SUBCUTANEOUS
  Filled 2012-03-08 (×2): qty 10

## 2012-03-08 MED ORDER — HYDROCODONE-ACETAMINOPHEN 10-325 MG PO TABS
1.0000 | ORAL_TABLET | Freq: Four times a day (QID) | ORAL | Status: DC | PRN
  Administered 2012-03-09 (×2): 1 via ORAL
  Filled 2012-03-08 (×2): qty 1

## 2012-03-08 MED ORDER — ALBUTEROL SULFATE (5 MG/ML) 0.5% IN NEBU: 2.5000 mg | INHALATION_SOLUTION | RESPIRATORY_TRACT | Status: DC | PRN

## 2012-03-08 MED ORDER — PANTOPRAZOLE SODIUM 40 MG PO TBEC
40.0000 mg | DELAYED_RELEASE_TABLET | Freq: Two times a day (BID) | ORAL | Status: DC
  Administered 2012-03-08 – 2012-03-13 (×11): 40 mg via ORAL
  Filled 2012-03-08 (×11): qty 1

## 2012-03-08 MED ORDER — MORPHINE SULFATE 4 MG/ML IJ SOLN
4.0000 mg | Freq: Once | INTRAMUSCULAR | Status: AC
  Administered 2012-03-08: 4 mg via INTRAVENOUS
  Filled 2012-03-08: qty 1

## 2012-03-08 MED ORDER — SODIUM CHLORIDE 0.9 % IJ SOLN
INTRAMUSCULAR | Status: AC
  Administered 2012-03-08: 10 mL
  Filled 2012-03-08: qty 3

## 2012-03-08 MED ORDER — METOPROLOL TARTRATE 50 MG PO TABS
50.0000 mg | ORAL_TABLET | Freq: Two times a day (BID) | ORAL | Status: DC
  Administered 2012-03-08 – 2012-03-13 (×10): 50 mg via ORAL
  Filled 2012-03-08 (×11): qty 1

## 2012-03-08 MED ORDER — ACETAMINOPHEN 650 MG RE SUPP: 650.0000 mg | Freq: Four times a day (QID) | RECTAL | Status: DC | PRN

## 2012-03-08 MED ORDER — INSULIN ASPART 100 UNIT/ML ~~LOC~~ SOLN
0.0000 [IU] | Freq: Every day | SUBCUTANEOUS | Status: DC
  Administered 2012-03-09: 0 [IU] via SUBCUTANEOUS
  Administered 2012-03-11 – 2012-03-12 (×2): 2 [IU] via SUBCUTANEOUS

## 2012-03-08 MED ORDER — ACETAMINOPHEN 325 MG PO TABS
650.0000 mg | ORAL_TABLET | Freq: Four times a day (QID) | ORAL | Status: DC | PRN
  Administered 2012-03-10: 650 mg via ORAL
  Filled 2012-03-08 (×3): qty 2

## 2012-03-08 MED ORDER — LEVOFLOXACIN IN D5W 750 MG/150ML IV SOLN
INTRAVENOUS | Status: AC
  Filled 2012-03-08: qty 150

## 2012-03-08 MED ORDER — MORPHINE SULFATE 2 MG/ML IJ SOLN
2.0000 mg | INTRAMUSCULAR | Status: DC | PRN
  Administered 2012-03-08 – 2012-03-11 (×9): 2 mg via INTRAVENOUS
  Filled 2012-03-08 (×9): qty 1

## 2012-03-08 NOTE — Progress Notes (Signed)
I have reviewed Dr. Lyman Speller note and assumed her care

## 2012-03-08 NOTE — ED Notes (Signed)
Pt c/o "pain all over"  And fever.  Pt c/o pain to left flank

## 2012-03-08 NOTE — Consult Note (Signed)
ANTIBIOTIC CONSULT NOTE - INITIAL  Pharmacy Consult for vancomycin and levaquin Indication: cellulitis  Allergies  Allergen Reactions  . Contrast Media (Iodinated Diagnostic Agents) Hives and Swelling    Dye for cardiac cath. Tongue swells  . Pineapple Itching and Swelling  . Pneumococcal Vaccines Swelling    Turns skin black, and swelling  . Shellfish Allergy Itching and Swelling    Patient Measurements: Height: _0  (165.1 cm) Weight: 358 lb 14.5 oz (162.8 kg) IBW/kg (Calculated) : 57   Vital Signs: Temp: 99.7 F (37.6 C) (03/20 0449) Temp src: Oral (03/20 0449) BP: 137/86 mmHg (03/20 0449) Pulse Rate: 104  (03/20 0449) Intake/Output from previous day: 03/19 0701 - 03/20 0700 In: 262.5 [I.V.:112.5; IV Piggyback:150] Out: -  Intake/Output from this shift:    Labs:  Basename 03/08/12 0447 03/08/12 0055  WBC 7.0 6.7  HGB 10.5* 11.7*  PLT 246 263  LABCREA -- --  CREATININE 1.27* 1.33*   Estimated Creatinine Clearance: 87.7 ml/min (by C-G formula based on Cr of 1.27). No results found for this basename: VANCOTROUGH:2,VANCOPEAK:2,VANCORANDOM:2,GENTTROUGH:2,GENTPEAK:2,GENTRANDOM:2,TOBRATROUGH:2,TOBRAPEAK:2,TOBRARND:2,AMIKACINPEAK:2,AMIKACINTROU:2,AMIKACIN:2, in the last 72 hours   Microbiology: No results found for this or any previous visit (from the past 720 hour(s)).  Medical History: Past Medical History  Diagnosis Date  . Diabetes mellitus   . Chronic pain   . Lymph edema   . Cellulitis   . Chiari malformation     s/p surgery  . S/P colonoscopy 05/26/2007    Dr. Laural Golden sigmoid diverticulosis random biopsies benign  . CAD (coronary artery disease)   . Hypercholesterolemia   . Hypertension   . Asthma   . S/P endoscopy 05/01/2009    Dr. Penelope Coop pill-induced esophageal ulcerations distal to midesophagus, 2 small ulcers in the antrum of the stomach   Medications:  Scheduled:    . heparin  5,000 Units Subcutaneous Q8H  . insulin aspart  0-15 Units  Subcutaneous TID WC  . insulin aspart  0-5 Units Subcutaneous QHS  . insulin NPH  30 Units Subcutaneous BID AC  . levofloxacin (LEVAQUIN) IV  750 mg Intravenous Once  . levofloxacin (LEVAQUIN) IV  750 mg Intravenous Q24H  . metoprolol  50 mg Oral BID  . morphine  4 mg Intravenous Once  . ondansetron  4 mg Intravenous Once  . pantoprazole  40 mg Oral BID AC  . sodium chloride  3 mL Intravenous Q12H  . vancomycin  1,000 mg Intravenous Q8H  . DISCONTD: vancomycin  1,000 mg Intravenous Once   Assessment: Obesity Good renal fxn  Goal of Therapy:  Vancomycin trough level 10-15 mcg/ml Eradicate infection.  Plan: Vancomycin 1gm iv q8hrs Check trough at steady state Levaquin 765m iv q24hrs Labs per protocol  HHart RobinsonsA 03/08/2012,8:53 AM

## 2012-03-08 NOTE — ED Provider Notes (Signed)
History     CSN: [id number]  Arrival date & time 03/08/12  0118   First MD Initiated Contact with Patient 03/08/12 0144      Chief Complaint  Patient presents with  . Fever  . Flank Pain    (Consider location/radiation/quality/duration/timing/severity/associated sxs/prior treatment) HPI Comments: 46 year old female with a history of lymphedema, diabetes, cellulitis who presents with a complaint of pain all over her body for the last week, fever and chills which developed today. The symptoms were gradual in onset, persistent, gradually getting worse and now associated with a fever as high as 101. She does admit to having associated right flank pain where her kidney is. According to the medical record the patient had an extended admission to the hospital in November of 2012 related to a cellulitis of her leg that became a systemic infection which developed into sepsis.  The patient denies dysuria, diarrhea, back pain, blurred vision, headache, numbness, weakness, slurred speech, coughing or shortness of breath  The history is provided by the patient, medical records and a relative.    Past Medical History  Diagnosis Date  . Diabetes mellitus   . Chronic pain   . Lymph edema   . Cellulitis   . Chiari malformation     s/p surgery  . S/P colonoscopy 05/26/2007    Dr. Laural Golden sigmoid diverticulosis random biopsies benign  . CAD (coronary artery disease)   . Hypercholesterolemia   . Hypertension   . Asthma   . S/P endoscopy 05/01/2009    Dr. Penelope Coop pill-induced esophageal ulcerations distal to midesophagus, 2 small ulcers in the antrum of the stomach    Past Surgical History  Procedure Date  . Cesarean section      x 2  . Abdominal hysterectomy   . Tonsillectomy   . Adenoidectomy   . Thyroidectomy, partial   . Craniectomy suboccipital w/ cervical laminectomy / chiari     Family History  Problem Relation Age of Onset  . Colon cancer Mother 90  . Stroke Mother 19  .  Coronary artery disease Mother     History  Substance Use Topics  . Smoking status: Never Smoker   . Smokeless tobacco: Not on file  . Alcohol Use: No    OB History    Grav Para Term Preterm Abortions TAB SAB Ect Mult Living                  Review of Systems  All other systems reviewed and are negative.    Allergies  Contrast media; Pineapple; Pneumococcal vaccines; and Shellfish allergy  Home Medications   Current Outpatient Rx  Name Route Sig Dispense Refill  . ACETAMINOPHEN 325 MG PO TABS Oral Take 650 mg by mouth every 6 (six) hours as needed. fever    . AMLODIPINE BESYLATE 10 MG PO TABS Oral Take 1 tablet (10 mg total) by mouth daily. 30 tablet 0  . ENALAPRIL MALEATE 2.5 MG PO TABS Oral Take 2.5 mg by mouth daily.    Marland Kitchen HYDROCODONE-ACETAMINOPHEN 10-325 MG PO TABS Oral Take 1 tablet by mouth every 6 (six) hours as needed.    . INSULIN LISPRO (HUMAN) 100 UNIT/ML [place] SOLN Subcutaneous Inject 60 Units into the skin 2 (two) times daily.      Marland Kitchen METOPROLOL TARTRATE 50 MG PO TABS Oral Take 1 tablet (50 mg total) by mouth 2 (two) times daily. 60 tablet 0  . PANTOPRAZOLE SODIUM 40 MG PO TBEC Oral Take 1 tablet (40 mg  total) by mouth 2 (two) times daily before a meal. 60 tablet 0  . POTASSIUM CHLORIDE CRYS ER 20 MEQ PO TBCR Oral Take 1 tablet (20 mEq total) by mouth 3 (three) times daily. Take 20 mEq by mouth 2 (three) times daily. 60 tablet 0  . PROMETHAZINE HCL 12.5 MG PO TABS Oral Take 12.5 mg by mouth every 6 (six) hours as needed.    . TORSEMIDE 100 MG PO TABS Oral Take 50 mg by mouth 2 (two) times daily.     . OXYCODONE-ACETAMINOPHEN 5-325 MG PO TABS Oral Take 1 tablet by mouth 4 (four) times daily as needed. For pain       BP 166/90  Pulse 105  Temp(Src) 100.9 F (38.3 C) (Oral)  Resp 22  Ht _0  (1.651 m)  Wt 340 lb (154.223 kg)  BMI 56.58 kg/m2  SpO2 100%  Physical Exam  Nursing note and vitals reviewed. Constitutional: She appears well-developed and  well-nourished.       Uncomfortable appearing  HENT:  Head: Normocephalic and atraumatic.  Mouth/Throat: Oropharynx is clear and moist. No oropharyngeal exudate.  Eyes: Conjunctivae and EOM are normal. Pupils are equal, round, and reactive to light. Right eye exhibits no discharge. Left eye exhibits no discharge. No scleral icterus.  Neck: Normal range of motion. Neck supple. No JVD present. No thyromegaly present.  Cardiovascular: Regular rhythm, normal heart sounds and intact distal pulses.  Exam reveals no gallop and no friction rub.   No murmur heard.      Tachycardia  Pulmonary/Chest: Effort normal and breath sounds normal. No respiratory distress. She has no wheezes. She has no rales.  Abdominal: Soft. Bowel sounds are normal. She exhibits no distension and no mass. There is no tenderness.  Musculoskeletal: Normal range of motion. She exhibits edema and tenderness.       Bilateral severe lymphedema, left greater than right, left medial thigh with erythema tenderness and warmth  Lymphadenopathy:    She has no cervical adenopathy.  Neurological: She is alert. Coordination normal.  Skin: Skin is warm and dry. No rash noted. No erythema.       Erythema tenderness and warmth to the left inner thigh  Psychiatric: She has a normal mood and affect. Her behavior is normal.    ED Course  Procedures (including critical care time)  Labs Reviewed  CBC - Abnormal; Notable for the following:    Hemoglobin 11.7 (*)    HCT 35.1 (*)    All other components within normal limits  BASIC METABOLIC PANEL - Abnormal; Notable for the following:    Sodium 132 (*)    Chloride 89 (*)    Glucose, Bld 314 (*)    BUN 24 (*)    Creatinine, Ser 1.33 (*)    GFR calc non Af Amer 47 (*)    GFR calc Af Amer 55 (*)    All other components within normal limits  DIFFERENTIAL  LACTIC ACID, PLASMA  URINALYSIS, ROUTINE W REFLEX MICROSCOPIC  URINE CULTURE   No results found.   1. Cellulitis   2. Lymphedema        MDM  Patient has exam consistent with a cellulitis related to the lymphedema of her legs. Has temperature of 100.9, pulse of 105, blood pressure normal, sats normal. We'll proceed with workup for systemic inflammatory response syndrome likely related to cellulitis. Possible early sepsis, in and out catheterization for urine given right flank pain.  Laboratory data shows elevated creatinine at  1.3 though appears improved from prior admission when she was in acute renal failure. Hyponatremia at 132, glucose of 314.  White blood cell count of 6.7, hemoglobin 11.7, lactic acid of 2.0  Due to the elevated temperature and tachycardia with focal finding on the leg Will admit to the hospital for further IV antibiotics.  D/w Dr. Carilyn Goodpasture of [facility] who will admit.  Vancomycin started in ED  Johnna Acosta, MD 03/08/12 [phone]

## 2012-03-08 NOTE — H&P (Signed)
[name] is an 46 y.o. female.    PCP: Alonza Bogus, MD, MD   Chief Complaint: Fever and chills and pain in the legs  HPI: This is a 46 year old, African American female, who is morbidly obese, who is lymphedema, and diabetes, who was in her usual state of health till about a week ago, when she started experiencing pain in the legs. The pain started getting worse and was more so in the left leg compared to the right. Then she started getting a fever with chills. Dizziness. Nausea without any vomiting. She also complained of left flank pain, which is been on, and off since November. The legs got very, very tender and painful. The pain was 10 out of 10 in intensity. Currently, is 4 of 10 in intensity. She was diagnosed with diverticulitis back in February and completed a course of antibiotics for the same and it did get better. Denies any dysuria or hematuria. Denies any diarrhea.   Home Medications: Prior to Admission medications   Medication Sig Start Date End Date Taking? Authorizing Provider  acetaminophen (TYLENOL) 325 MG tablet Take 650 mg by mouth every 6 (six) hours as needed. fever   Yes Historical Provider, MD  amLODipine (NORVASC) 10 MG tablet Take 1 tablet (10 mg total) by mouth daily. 11/22/11 11/21/12 Yes Hosie Poisson, MD  enalapril (VASOTEC) 2.5 MG tablet Take 2.5 mg by mouth daily.   Yes Historical Provider, MD  HYDROcodone-acetaminophen (NORCO) 10-325 MG per tablet Take 1 tablet by mouth every 6 (six) hours as needed.   Yes Historical Provider, MD  insulin lispro (HUMALOG) 100 UNIT/ML injection Inject 60 Units into the skin 2 (two) times daily.     Yes Historical Provider, MD  metoprolol (LOPRESSOR) 50 MG tablet Take 1 tablet (50 mg total) by mouth 2 (two) times daily. 11/22/11 11/21/12 Yes Hosie Poisson, MD  pantoprazole (PROTONIX) 40 MG tablet Take 1 tablet (40 mg total) by mouth 2 (two) times daily before a meal. 11/22/11 11/21/12 Yes Hosie Poisson, MD  potassium chloride  SA (K-DUR,KLOR-CON) 20 MEQ tablet Take 1 tablet (20 mEq total) by mouth 3 (three) times daily. Take 20 mEq by mouth 2 (three) times daily. 11/22/11  Yes Hosie Poisson, MD  promethazine (PHENERGAN) 12.5 MG tablet Take 12.5 mg by mouth every 6 (six) hours as needed.   Yes Historical Provider, MD  torsemide (DEMADEX) 100 MG tablet Take 50 mg by mouth 2 (two) times daily.    Yes Historical Provider, MD  oxyCODONE-acetaminophen (PERCOCET) 5-325 MG per tablet Take 1 tablet by mouth 4 (four) times daily as needed. For pain     Historical Provider, MD    Allergies:  Allergies  Allergen Reactions  . Contrast Media (Iodinated Diagnostic Agents) Hives and Swelling    Dye for cardiac cath. Tongue swells  . Pineapple Itching and Swelling  . Pneumococcal Vaccines Swelling    Turns skin black, and swelling  . Shellfish Allergy Itching and Swelling    Past Medical History: Past Medical History  Diagnosis Date  . Diabetes mellitus   . Chronic pain   . Lymph edema   . Cellulitis   . Chiari malformation     s/p surgery  . S/P colonoscopy 05/26/2007    Dr. Laural Golden sigmoid diverticulosis random biopsies benign  . CAD (coronary artery disease)   . Hypercholesterolemia   . Hypertension   . Asthma   . S/P endoscopy 05/01/2009    Dr. Penelope Coop pill-induced esophageal ulcerations distal  to midesophagus, 2 small ulcers in the antrum of the stomach    Past Surgical History  Procedure Date  . Cesarean section      x 2  . Abdominal hysterectomy   . Tonsillectomy   . Adenoidectomy   . Thyroidectomy, partial   . Craniectomy suboccipital w/ cervical laminectomy / chiari     Social History:  reports that she has never smoked. She does not have any smokeless tobacco history on file. She reports that she does not drink alcohol or use illicit drugs.  Family History:  Family History  Problem Relation Age of Onset  . Colon cancer Mother 96  . Stroke Mother 38  . Coronary artery disease Mother     Review  of Systems - History obtained from the patient General ROS: negative Psychological ROS: negative Ophthalmic ROS: negative ENT ROS: negative Allergy and Immunology ROS: negative Hematological and Lymphatic ROS: negative Endocrine ROS: negative Respiratory ROS: negative Cardiovascular ROS: negative Gastrointestinal ROS: negative Genito-Urinary ROS: negative Musculoskeletal ROS: as in hpi Neurological ROS: negative Dermatological ROS: negative  Physical Examination Blood pressure 166/90, pulse 105, temperature 100.9 F (38.3 C), temperature source Oral, resp. rate 22, height _0  (1.651 m), weight 154.223 kg (340 lb), SpO2 100.00%.  General appearance: alert, cooperative, no distress and morbidly obese Head: Normocephalic, without obvious abnormality, atraumatic Eyes: conjunctivae/corneas clear. PERRL, EOM's intact.  Throat: lips, mucosa, and tongue normal; teeth and gums normal Neck: no adenopathy, no carotid bruit, no JVD, supple, symmetrical, trachea midline and thyroid not enlarged, symmetric, no tenderness/mass/nodules Resp: clear to auscultation bilaterally Cardio: regular rate and rhythm, S1, S2 normal, no murmur, click, rub or gallop GI: soft, non-tender; bowel sounds normal; no masses,  no organomegaly Extremities: Both the legs showed evidence for lymphedema. There is erythema noted in the left thigh medially. Both legs are tender to patient. No induration is present. No obvious pus collection is noted. Pulses: Difficult to palpate pedal pulses due to lymphedema Skin: Lymph edematous changes in the lower extremities Lymph nodes: Cervical, supraclavicular, and axillary nodes normal. Neurologic: Grossly normal  Laboratory Data: Results for orders placed during the hospital encounter of 03/08/12 (from the past 48 hour(s))  CBC     Status: Abnormal   Collection Time   03/08/12 12:55 AM      Component Value Range Comment   WBC 6.7  4.0 - 10.5 (K/uL)    RBC 3.97  3.87 - 5.11  (MIL/uL)    Hemoglobin 11.7 (*) 12.0 - 15.0 (g/dL)    HCT 35.1 (*) 36.0 - 46.0 (%)    MCV 88.4  78.0 - 100.0 (fL)    MCH 29.5  26.0 - 34.0 (pg)    MCHC 33.3  30.0 - 36.0 (g/dL)    RDW 13.1  11.5 - 15.5 (%)    Platelets 263  150 - 400 (K/uL)   DIFFERENTIAL     Status: Normal   Collection Time   03/08/12 12:55 AM      Component Value Range Comment   Neutrophils Relative 65  43 - 77 (%)    Neutro Abs 4.4  1.7 - 7.7 (K/uL)    Lymphocytes Relative 24  12 - 46 (%)    Lymphs Abs 1.6  0.7 - 4.0 (K/uL)    Monocytes Relative 8  3 - 12 (%)    Monocytes Absolute 0.5  0.1 - 1.0 (K/uL)    Eosinophils Relative 3  0 - 5 (%)    Eosinophils Absolute  0.2  0.0 - 0.7 (K/uL)    Basophils Relative 0  0 - 1 (%)    Basophils Absolute 0.0  0.0 - 0.1 (K/uL)   BASIC METABOLIC PANEL     Status: Abnormal   Collection Time   03/08/12 12:55 AM      Component Value Range Comment   Sodium 132 (*) 135 - 145 (mEq/L)    Potassium 3.9  3.5 - 5.1 (mEq/L)    Chloride 89 (*) 96 - 112 (mEq/L)    CO2 31  19 - 32 (mEq/L)    Glucose, Bld 314 (*) 70 - 99 (mg/dL)    BUN 24 (*) 6 - 23 (mg/dL)    Creatinine, Ser 1.33 (*) 0.50 - 1.10 (mg/dL)    Calcium 9.6  8.4 - 10.5 (mg/dL)    GFR calc non Af Amer 47 (*) >90 (mL/min)    GFR calc Af Amer 55 (*) >90 (mL/min)   LACTIC ACID, PLASMA     Status: Normal   Collection Time   03/08/12 12:58 AM      Component Value Range Comment   Lactic Acid, Venous 2.0  0.5 - 2.2 (mmol/L)     Radiology Reports: No results found.  Assessment/Plan  Principal Problem:  *Cellulitis of left leg Active Problems:  Lymphedema of lower extremity  Diabetes mellitus  Morbid obesity  Acute renal failure  Anemia  HTN (hypertension)   #1 cellulitis of the left leg: She'll be treated with vancomycin and Levaquin.  #2 mild acute renal failure. Will be treated with IV fluids. We will hold her ACE inhibitor.  #3 diabetes: Continue with insulin and sliding scale. HbA1c will be checked.  #4  hypertension: Continue metoprolol withholding parameters.  DVT, prophylaxis will be initiated.  Further management decisions will depend on results of further testing and patient's response to treatment.  Dr. [name] will assume care of this patient in the morning  [facility] Pager [phone]  03/08/2012, 3:14 AM

## 2012-03-08 NOTE — Progress Notes (Signed)
   CARE MANAGEMENT NOTE 03/08/2012  Patient:  [name], [name]   Account Number:  [id number]  Date Initiated:  03/08/2012  Documentation initiated by:  Theophilus Kinds  Subjective/Objective Assessment:   Pt admitted fromhome with cellulitis of leg. Pt lives with husband and does not work. Pt stated she just got her medications refilled. Pt has used [facility] in the past and if needs HH at discharge would like to use them again.     Action/Plan:   Madelaine Etienne, financial counselor, consulted to discuss hospital bill. May need to help pt with medications at discharge.   Anticipated DC Date:  03/15/2012   Anticipated DC Plan:  [place]  CM consult      Choice offered to / List presented to:             Status of service:  In process, will continue to follow Medicare Important Message given?   (If response is "NO", the following Medicare IM given date fields will be blank) Date Medicare IM given:   Date Additional Medicare IM given:    Discharge Disposition:  [place]  Per UR Regulation:    If discussed at Long Length of Stay Meetings, dates discussed:    Comments:  03/08/12 [place], RN BSN CM Pt admitted from home. Has no insurance. Financial counselor consulted. Will monitor for discharge needs.

## 2012-03-09 LAB — BASIC METABOLIC PANEL
BUN: 28 mg/dL — ABNORMAL HIGH (ref 6–23)
Creatinine, Ser: 1.41 mg/dL — ABNORMAL HIGH (ref 0.50–1.10)
GFR calc Af Amer: 51 mL/min — ABNORMAL LOW (ref >90)
GFR calc non Af Amer: 44 mL/min — ABNORMAL LOW (ref >90)
Glucose, Bld: 274 mg/dL — ABNORMAL HIGH (ref 70–99)

## 2012-03-09 LAB — DIFFERENTIAL
Basophils Absolute: 0 10*3/uL (ref 0.0–0.1)
Basophils Relative: 0 % (ref 0–1)
Eosinophils Absolute: 0.4 10*3/uL (ref 0.0–0.7)
Monocytes Absolute: 0.4 10*3/uL (ref 0.1–1.0)
Monocytes Relative: 6 % (ref 3–12)
Neutro Abs: 4.4 10*3/uL (ref 1.7–7.7)
Neutrophils Relative %: 68 % (ref 43–77)

## 2012-03-09 LAB — GLUCOSE, CAPILLARY
Glucose-Capillary: 231 mg/dL — ABNORMAL HIGH (ref 70–99)
Glucose-Capillary: 169 mg/dL — ABNORMAL HIGH (ref 70–99)

## 2012-03-09 LAB — CBC
HCT: 30.2 % — ABNORMAL LOW (ref 36.0–46.0)
Hemoglobin: 10.1 g/dL — ABNORMAL LOW (ref 12.0–15.0)
MCH: 30.1 pg (ref 26.0–34.0)
MCHC: 33.4 g/dL (ref 30.0–36.0)
RDW: 13.3 % (ref 11.5–15.5)

## 2012-03-09 LAB — URINE CULTURE: Colony Count: NO GROWTH

## 2012-03-09 LAB — MRSA PCR SCREENING: MRSA by PCR: NEGATIVE

## 2012-03-09 NOTE — Consult Note (Signed)
ANTIBIOTIC CONSULT NOTE   Pharmacy Consult for vancomycin and levaquin Indication: cellulitis  Allergies  Allergen Reactions  . Contrast Media (Iodinated Diagnostic Agents) Hives and Swelling    Dye for cardiac cath. Tongue swells  . Pineapple Itching and Swelling  . Pneumococcal Vaccines Swelling    Turns skin black, and swelling  . Shellfish Allergy Itching and Swelling   Patient Measurements: Height: _0  (165.1 cm) Weight: 358 lb 14.5 oz (162.8 kg) IBW/kg (Calculated) : 57   Vital Signs: Temp: 98.5 F (36.9 C) (03/21 1357) Temp src: Oral (03/21 0511) BP: 162/89 mmHg (03/21 1357) Pulse Rate: 89  (03/21 1357) Intake/Output from previous day: 03/20 0701 - 03/21 0700 In: 3844 [P.O.:720; I.V.:2724; IV Piggyback:400] Out: 500 [Urine:500] Intake/Output from this shift: Total I/O In: 360 [P.O.:360] Out: -   Labs:  Basename 03/09/12 0605 03/08/12 0447 03/08/12 0055  WBC 6.5 7.0 6.7  HGB 10.1* 10.5* 11.7*  PLT 220 246 263  LABCREA -- -- --  CREATININE 1.41* 1.27* 1.33*   Estimated Creatinine Clearance: 79 ml/min (by C-G formula based on Cr of 1.41). No results found for this basename: VANCOTROUGH:2,VANCOPEAK:2,VANCORANDOM:2,GENTTROUGH:2,GENTPEAK:2,GENTRANDOM:2,TOBRATROUGH:2,TOBRAPEAK:2,TOBRARND:2,AMIKACINPEAK:2,AMIKACINTROU:2,AMIKACIN:2, in the last 72 hours   Microbiology: Recent Results (from the past 720 hour(s))  URINE CULTURE     Status: Normal   Collection Time   03/08/12  2:39 AM      Component Value Range Status Comment   Specimen Description URINE, CATHETERIZED   Final    Special Requests NONE   Final    Culture  Setup Time [id number]   Final    Colony Count NO GROWTH   Final    Culture NO GROWTH   Final    Report Status 03/09/2012 FINAL   Final    Medical History: Past Medical History  Diagnosis Date  . Diabetes mellitus   . Chronic pain   . Lymph edema   . Cellulitis   . Chiari malformation     s/p surgery  . S/P colonoscopy 05/26/2007    Dr.  Laural Golden sigmoid diverticulosis random biopsies benign  . CAD (coronary artery disease)   . Hypercholesterolemia   . Hypertension   . Asthma   . S/P endoscopy 05/01/2009    Dr. Penelope Coop pill-induced esophageal ulcerations distal to midesophagus, 2 small ulcers in the antrum of the stomach   Medications:  Scheduled:     . heparin  5,000 Units Subcutaneous Q8H  . insulin aspart  0-15 Units Subcutaneous TID WC  . insulin aspart  0-5 Units Subcutaneous QHS  . insulin NPH  30 Units Subcutaneous BID AC  . levofloxacin (LEVAQUIN) IV  750 mg Intravenous Q24H  . metoprolol  50 mg Oral BID  . pantoprazole  40 mg Oral BID AC  . sodium chloride      . vancomycin  1,000 mg Intravenous Q8H  . DISCONTD: sodium chloride  3 mL Intravenous Q12H  . DISCONTD: vancomycin  1,000 mg Intravenous Q8H   Assessment: Obesity Good renal fxn  Goal of Therapy:  Vancomycin trough level 10-15 mcg/ml Eradicate infection.  Plan: Vancomycin 1gm iv q8hrs Check trough tomorrow am Levaquin 739m iv q24hrs Labs per protocol  HHart RobinsonsA 03/09/2012,2:06 PM

## 2012-03-09 NOTE — Progress Notes (Signed)
Inpatient Diabetes Program Recommendations  AACE/ADA: New Consensus Statement on Inpatient Glycemic Control  Target Ranges:  Prepandial:   less than 140 mg/dL      Peak postprandial:   less than 180 mg/dL (1-2 hours)      Critically ill patients:  140 - 180 mg/dL  Pager:  [phone] Hours:  8 am-10pm   Reason for Visit: Elevated glucose:  Results for JACKLYNE, BAIK (MRN [id number]) as of 03/09/2012 09:55  Ref. Range 03/08/2012 08:11 03/08/2012 11:21 03/08/2012 17:07 03/08/2012 20:53 03/09/2012 07:27  Glucose-Capillary Latest Range: 70-99 mg/dL 277 (H) 266 (H) 225 (H) 194 (H) 231 (H)    Inpatient Diabetes Program Recommendations Insulin - Basal: Increase NPH to 40 units BID Insulin - Meal Coverage: Add Novolog 6 units TID

## 2012-03-09 NOTE — Progress Notes (Signed)
Subjective: She says she feels better. She is having some headache and is still having leg pain. She has no other new complaints  Objective: Vital signs in last 24 hours: Temp:  [98.5 F (36.9 C)-99.1 F (37.3 C)] 99.1 F (37.3 C) (03/21 0511) Pulse Rate:  [75-84] 80  (03/21 0511) Resp:  [20] 20  (03/21 0511) BP: (106-145)/(52-86) 136/86 mmHg (03/21 0511) SpO2:  [93 %-97 %] 93 % (03/21 0511) Weight change:  Last BM Date: 03/07/12  Intake/Output from previous day: 03/20 0701 - 03/21 0700 In: 3844 [P.O.:720; I.V.:2724; IV Piggyback:400] Out: 500 [Urine:500]  PHYSICAL EXAM General appearance: alert, cooperative, no distress and morbidly obese Resp: clear to auscultation bilaterally Cardio: regular rate and rhythm, S1, S2 normal, no murmur, click, rub or gallop GI: soft, non-tender; bowel sounds normal; no masses,  no organomegaly Extremities: She has marked lymphedema and cellulitis of the left thigh  Lab Results:    Basic Metabolic Panel:  Basename 03/09/12 0605 03/08/12 0447  NA 134* 134*  K 4.3 4.0  CL 96 93*  CO2 28 30  GLUCOSE 274* 344*  BUN 28* 24*  CREATININE 1.41* 1.27*  CALCIUM 9.0 9.2  MG -- --  PHOS -- --   Liver Function Tests:  Basename 03/08/12 0447  AST 10  ALT 11  ALKPHOS 99  BILITOT 0.2*  PROT 7.1  ALBUMIN 3.3*   No results found for this basename: LIPASE:2,AMYLASE:2 in the last 72 hours No results found for this basename: AMMONIA:2 in the last 72 hours CBC:  Basename 03/09/12 0605 03/08/12 0447 03/08/12 0055  WBC 6.5 7.0 --  NEUTROABS 4.4 -- 4.4  HGB 10.1* 10.5* --  HCT 30.2* 31.1* --  MCV 90.1 89.1 --  PLT 220 246 --   Cardiac Enzymes: No results found for this basename: CKTOTAL:3,CKMB:3,CKMBINDEX:3,TROPONINI:3 in the last 72 hours BNP: No results found for this basename: PROBNP:3 in the last 72 hours D-Dimer: No results found for this basename: DDIMER:2 in the last 72 hours CBG:  Basename 03/09/12 0727 03/08/12 2053 03/08/12  1707 03/08/12 1121 03/08/12 0811  GLUCAP 231* 194* 225* 266* 277*   Hemoglobin A1C:  Basename 03/08/12 0447  HGBA1C 10.6*   Fasting Lipid Panel: No results found for this basename: CHOL,HDL,LDLCALC,TRIG,CHOLHDL,LDLDIRECT in the last 72 hours Thyroid Function Tests: No results found for this basename: TSH,T4TOTAL,FREET4,T3FREE,THYROIDAB in the last 72 hours Anemia Panel: No results found for this basename: VITAMINB12,FOLATE,FERRITIN,TIBC,IRON,RETICCTPCT in the last 72 hours Coagulation: No results found for this basename: LABPROT:2,INR:2 in the last 72 hours Urine Drug Screen: Drugs of Abuse  No results found for this basename: labopia, cocainscrnur, labbenz, amphetmu, thcu, labbarb    Alcohol Level: No results found for this basename: ETH:2 in the last 72 hours Urinalysis:  Basename 03/08/12 0239  COLORURINE YELLOW  LABSPEC 1.020  PHURINE 6.0  GLUCOSEU 100*  HGBUR SMALL*  BILIRUBINUR NEGATIVE  KETONESUR NEGATIVE  PROTEINUR 100*  UROBILINOGEN 0.2  NITRITE NEGATIVE  LEUKOCYTESUR NEGATIVE   Misc. Labs:  ABGS No results found for this basename: PHART,PCO2,PO2ART,TCO2,HCO3 in the last 72 hours CULTURES Recent Results (from the past 240 hour(s))  URINE CULTURE     Status: Normal   Collection Time   03/08/12  2:39 AM      Component Value Range Status Comment   Specimen Description URINE, CATHETERIZED   Final    Special Requests NONE   Final    Culture  Setup Time [id number]   Final    Colony Count NO GROWTH  Final    Culture NO GROWTH   Final    Report Status 03/09/2012 FINAL   Final    Studies/Results: No results found.  Medications:  Prior to Admission:  Prescriptions prior to admission  Medication Sig Dispense Refill  . acetaminophen (TYLENOL) 325 MG tablet Take 650 mg by mouth every 6 (six) hours as needed. fever      . amLODipine (NORVASC) 10 MG tablet Take 1 tablet (10 mg total) by mouth daily.  30 tablet  0  . enalapril (VASOTEC) 2.5 MG tablet Take 2.5  mg by mouth daily.      Marland Kitchen HYDROcodone-acetaminophen (NORCO) 10-325 MG per tablet Take 1 tablet by mouth every 6 (six) hours as needed.      . insulin regular (NOVOLIN R,HUMULIN R) 100 units/mL injection Inject 60 Units into the skin 2 (two) times daily.      . metoprolol (LOPRESSOR) 50 MG tablet Take 1 tablet (50 mg total) by mouth 2 (two) times daily.  60 tablet  0  . pantoprazole (PROTONIX) 40 MG tablet Take 1 tablet (40 mg total) by mouth 2 (two) times daily before a meal.  60 tablet  0  . potassium chloride 20 MEQ/15ML (10%) solution Take by mouth daily.      Marland Kitchen torsemide (DEMADEX) 100 MG tablet Take 50 mg by mouth 2 (two) times daily.        Scheduled:   . heparin  5,000 Units Subcutaneous Q8H  . insulin aspart  0-15 Units Subcutaneous TID WC  . insulin aspart  0-5 Units Subcutaneous QHS  . insulin NPH  30 Units Subcutaneous BID AC  . levofloxacin (LEVAQUIN) IV  750 mg Intravenous Q24H  . metoprolol  50 mg Oral BID  . pantoprazole  40 mg Oral BID AC  . sodium chloride  3 mL Intravenous Q12H  . sodium chloride      . vancomycin  1,000 mg Intravenous Q8H  . DISCONTD: vancomycin  1,000 mg Intravenous Q8H   Continuous:   . sodium chloride 125 mL/hr at 03/09/12 0205   IBB:[id number], acetaminophen, albuterol, HYDROcodone-acetaminophen, morphine, ondansetron (ZOFRAN) IV, ondansetron  Assesment: She has cellulitis of the leg. She has marked lymphedema. She is diabetic. She had a similar episode about 6 months ago and ended up with a severe encephalopathy. The headache that she has started is concerning Principal Problem:  *Cellulitis of left leg Active Problems:  Lymphedema of lower extremity  Diabetes mellitus  Morbid obesity  Acute renal failure  Anemia  HTN (hypertension)    Plan: Continue with current treatments    LOS: 1 day   Decie Verne L 03/09/2012, 7:55 AM

## 2012-03-10 LAB — VANCOMYCIN, RANDOM: Vancomycin Rm: 36.3 ug/mL

## 2012-03-10 MED ORDER — SODIUM CHLORIDE 0.9 % IJ SOLN
INTRAMUSCULAR | Status: AC
  Administered 2012-03-10: 10 mL
  Filled 2012-03-10: qty 3

## 2012-03-10 MED ORDER — SODIUM CHLORIDE 0.9 % IV SOLN
600.0000 mg | Freq: Two times a day (BID) | INTRAVENOUS | Status: DC
  Administered 2012-03-10 – 2012-03-13 (×7): 600 mg via INTRAVENOUS
  Filled 2012-03-10 (×9): qty 600

## 2012-03-10 MED ORDER — SODIUM CHLORIDE 0.9 % IJ SOLN
INTRAMUSCULAR | Status: AC
  Filled 2012-03-10: qty 3

## 2012-03-10 MED ORDER — PROMETHAZINE HCL 25 MG/ML IJ SOLN
25.0000 mg | Freq: Four times a day (QID) | INTRAMUSCULAR | Status: DC | PRN
  Administered 2012-03-10 (×2): 25 mg via INTRAVENOUS
  Filled 2012-03-10 (×2): qty 1

## 2012-03-10 NOTE — Progress Notes (Signed)
Patient began having nausea and vomiting during the night.  Zosyn helped briefly but she began vomiting clear liquid at 5:30am intermittently until 0730 this morning.  During this time her b/p was 169/107. Temp. Elevated to 102.6.  On call physician was notified and blood cultures were ordered.  Continuing to monitor until Dr. [name] arrives to see her.

## 2012-03-10 NOTE — Progress Notes (Signed)
Subjective: She has developed some nausea fever and headache. Her blood pressure has come up. This is very similar to the situation that occurred last year when she had cellulitis developed severe headaches fever change in mental status and I'm concerned that it may be related to taking intravenous vancomycin.  Objective: Vital signs in last 24 hours: Temp:  [98.5 F (36.9 C)-101.6 F (38.7 C)] 101.6 F (38.7 C) (03/22 0621) Pulse Rate:  [82-95] 92  (03/22 0621) Resp:  [20] 20  (03/22 0621) BP: (105-169)/(68-107) 169/107 mmHg (03/22 0621) SpO2:  [90 %-93 %] 90 % (03/22 0621) Weight change:  Last BM Date: 03/08/12  Intake/Output from previous day: 03/21 0701 - 03/22 0700 In: 360 [P.O.:360] Out: 1800 [Urine:1800]  PHYSICAL EXAM General appearance: alert, cooperative, moderate distress and morbidly obese Resp: clear to auscultation bilaterally Cardio: regular rate and rhythm, S1, S2 normal, no murmur, click, rub or gallop GI: soft, non-tender; bowel sounds normal; no masses,  no organomegaly Extremities: Continued changes of cellulitis in the area of lymphedema above her knee  Lab Results:    Basic Metabolic Panel:  Basename 03/09/12 0605 03/08/12 0447  NA 134* 134*  K 4.3 4.0  CL 96 93*  CO2 28 30  GLUCOSE 274* 344*  BUN 28* 24*  CREATININE 1.41* 1.27*  CALCIUM 9.0 9.2  MG -- --  PHOS -- --   Liver Function Tests:  Basename 03/08/12 0447  AST 10  ALT 11  ALKPHOS 99  BILITOT 0.2*  PROT 7.1  ALBUMIN 3.3*   No results found for this basename: LIPASE:2,AMYLASE:2 in the last 72 hours No results found for this basename: AMMONIA:2 in the last 72 hours CBC:  Basename 03/09/12 0605 03/08/12 0447 03/08/12 0055  WBC 6.5 7.0 --  NEUTROABS 4.4 -- 4.4  HGB 10.1* 10.5* --  HCT 30.2* 31.1* --  MCV 90.1 89.1 --  PLT 220 246 --   Cardiac Enzymes: No results found for this basename: CKTOTAL:3,CKMB:3,CKMBINDEX:3,TROPONINI:3 in the last 72 hours BNP: No results found  for this basename: PROBNP:3 in the last 72 hours D-Dimer: No results found for this basename: DDIMER:2 in the last 72 hours CBG:  Basename 03/10/12 0747 03/09/12 2101 03/09/12 1651 03/09/12 1119 03/09/12 0727 03/08/12 2053  GLUCAP 196* 174* 216* 169* 231* 194*   Hemoglobin A1C:  Basename 03/08/12 0447  HGBA1C 10.6*   Fasting Lipid Panel: No results found for this basename: CHOL,HDL,LDLCALC,TRIG,CHOLHDL,LDLDIRECT in the last 72 hours Thyroid Function Tests: No results found for this basename: TSH,T4TOTAL,FREET4,T3FREE,THYROIDAB in the last 72 hours Anemia Panel: No results found for this basename: VITAMINB12,FOLATE,FERRITIN,TIBC,IRON,RETICCTPCT in the last 72 hours Coagulation: No results found for this basename: LABPROT:2,INR:2 in the last 72 hours Urine Drug Screen: Drugs of Abuse  No results found for this basename: labopia, cocainscrnur, labbenz, amphetmu, thcu, labbarb    Alcohol Level: No results found for this basename: ETH:2 in the last 72 hours Urinalysis:  Basename 03/08/12 0239  COLORURINE YELLOW  LABSPEC 1.020  PHURINE 6.0  GLUCOSEU 100*  HGBUR SMALL*  BILIRUBINUR NEGATIVE  KETONESUR NEGATIVE  PROTEINUR 100*  UROBILINOGEN 0.2  NITRITE NEGATIVE  LEUKOCYTESUR NEGATIVE   Misc. Labs:  ABGS No results found for this basename: PHART,PCO2,PO2ART,TCO2,HCO3 in the last 72 hours CULTURES Recent Results (from the past 240 hour(s))  URINE CULTURE     Status: Normal   Collection Time   03/08/12  2:39 AM      Component Value Range Status Comment   Specimen Description URINE, CATHETERIZED  Final    Special Requests NONE   Final    Culture  Setup Time [id number]   Final    Colony Count NO GROWTH   Final    Culture NO GROWTH   Final    Report Status 03/09/2012 FINAL   Final   MRSA PCR SCREENING     Status: Normal   Collection Time   03/09/12 10:57 AM      Component Value Range Status Comment   MRSA by PCR NEGATIVE  NEGATIVE  Final   CULTURE, BLOOD (ROUTINE X  2)     Status: Normal (Preliminary result)   Collection Time   03/10/12  8:12 AM      Component Value Range Status Comment   Specimen Description Blood LEFT HAND   Final    Special Requests NONE 6CC   Final    Culture PENDING   Incomplete    Report Status PENDING   Incomplete   CULTURE, BLOOD (ROUTINE X 2)     Status: Normal (Preliminary result)   Collection Time   03/10/12  8:14 AM      Component Value Range Status Comment   Specimen Description Blood LEFT ARM   Final    Special Requests NONE [facility]   Final    Culture PENDING   Incomplete    Report Status PENDING   Incomplete    Studies/Results: No results found.  Medications:  Prior to Admission:  Prescriptions prior to admission  Medication Sig Dispense Refill  . acetaminophen (TYLENOL) 325 MG tablet Take 650 mg by mouth every 6 (six) hours as needed. fever      . amLODipine (NORVASC) 10 MG tablet Take 1 tablet (10 mg total) by mouth daily.  30 tablet  0  . enalapril (VASOTEC) 2.5 MG tablet Take 2.5 mg by mouth daily.      Marland Kitchen HYDROcodone-acetaminophen (NORCO) 10-325 MG per tablet Take 1 tablet by mouth every 6 (six) hours as needed.      . insulin regular (NOVOLIN R,HUMULIN R) 100 units/mL injection Inject 60 Units into the skin 2 (two) times daily.      . metoprolol (LOPRESSOR) 50 MG tablet Take 1 tablet (50 mg total) by mouth 2 (two) times daily.  60 tablet  0  . pantoprazole (PROTONIX) 40 MG tablet Take 1 tablet (40 mg total) by mouth 2 (two) times daily before a meal.  60 tablet  0  . potassium chloride 20 MEQ/15ML (10%) solution Take by mouth daily.      Marland Kitchen torsemide (DEMADEX) 100 MG tablet Take 50 mg by mouth 2 (two) times daily.        Scheduled:   . ceFTAROline (TEFLARO) IV  600 mg Intravenous Q12H  . heparin  5,000 Units Subcutaneous Q8H  . insulin aspart  0-15 Units Subcutaneous TID WC  . insulin aspart  0-5 Units Subcutaneous QHS  . insulin NPH  30 Units Subcutaneous BID AC  . levofloxacin (LEVAQUIN) IV  750 mg  Intravenous Q24H  . metoprolol  50 mg Oral BID  . pantoprazole  40 mg Oral BID AC  . sodium chloride      . DISCONTD: sodium chloride  3 mL Intravenous Q12H  . DISCONTD: vancomycin  1,000 mg Intravenous Q8H   Continuous:   . sodium chloride 125 mL/hr (03/10/12 0015)   SLH:[id number], acetaminophen, albuterol, HYDROcodone-acetaminophen, morphine, ondansetron (ZOFRAN) IV, ondansetron  Assesment: She has cellulitis of the leg. She is developing nausea and vomiting headache and fever.  This may be a drug reaction Principal Problem:  *Cellulitis of left leg Active Problems:  Lymphedema of lower extremity  Diabetes mellitus  Morbid obesity  Acute renal failure  Anemia  HTN (hypertension)    Plan: I will have her switch to Teflaro    LOS: 2 days   [name] L 03/10/2012, 8:37 AM

## 2012-03-11 ENCOUNTER — Inpatient Hospital Stay (HOSPITAL_COMMUNITY): Payer: Medicaid Other

## 2012-03-11 LAB — BASIC METABOLIC PANEL
BUN: 21 mg/dL (ref 6–23)
Calcium: 9.3 mg/dL (ref 8.4–10.5)
Creatinine, Ser: 1.37 mg/dL — ABNORMAL HIGH (ref 0.50–1.10)
GFR calc non Af Amer: 46 mL/min — ABNORMAL LOW (ref >90)
Glucose, Bld: 173 mg/dL — ABNORMAL HIGH (ref 70–99)
Sodium: 135 mEq/L (ref 135–145)

## 2012-03-11 LAB — DIFFERENTIAL
Eosinophils Absolute: 0.4 10*3/uL (ref 0.0–0.7)
Eosinophils Relative: 6 % — ABNORMAL HIGH (ref 0–5)
Lymphs Abs: 1.8 10*3/uL (ref 0.7–4.0)
Monocytes Absolute: 0.7 10*3/uL (ref 0.1–1.0)
Monocytes Relative: 10 % (ref 3–12)

## 2012-03-11 LAB — CBC
MCH: 29.8 pg (ref 26.0–34.0)
MCV: 91.4 fL (ref 78.0–100.0)
Platelets: 230 10*3/uL (ref 150–400)
RBC: 3.25 MIL/uL — ABNORMAL LOW (ref 3.87–5.11)

## 2012-03-11 MED ORDER — SODIUM CHLORIDE 0.9 % IJ SOLN: 10.0000 mL | INTRAMUSCULAR | Status: DC | PRN

## 2012-03-11 MED ORDER — SODIUM CHLORIDE 0.9 % IJ SOLN
10.0000 mL | Freq: Two times a day (BID) | INTRAMUSCULAR | Status: DC
  Administered 2012-03-11 – 2012-03-13 (×4): 10 mL
  Filled 2012-03-11 (×2): qty 3
  Filled 2012-03-11: qty 6
  Filled 2012-03-11: qty 3

## 2012-03-11 MED ORDER — POTASSIUM CHLORIDE 20 MEQ/15ML (10%) PO LIQD
20.0000 meq | Freq: Two times a day (BID) | ORAL | Status: DC
  Administered 2012-03-11 – 2012-03-13 (×5): 20 meq via ORAL
  Filled 2012-03-11 (×5): qty 30

## 2012-03-11 MED ORDER — TORSEMIDE 20 MG PO TABS
50.0000 mg | ORAL_TABLET | Freq: Two times a day (BID) | ORAL | Status: DC
  Administered 2012-03-11 – 2012-03-13 (×4): 50 mg via ORAL
  Filled 2012-03-11 (×4): qty 3

## 2012-03-11 NOTE — Progress Notes (Signed)
NAMEALESIA, OSHIELDS              ACCOUNT NO.:  [id number]  MEDICAL RECORD NO.:  [id number]  LOCATION:  A453                          FACILITY:  [facility]  PHYSICIAN:  Geoge Lawrance L. [name], M.D.DATE OF BIRTH:  17-Aug-1966  DATE OF PROCEDURE: DATE OF DISCHARGE:                                PROGRESS NOTE   Ms. Yost was admitted with cellulitis of the leg.  She developed increased fever, complained of headache, started having increased nausea and vomiting, which was reminiscent of a problem that she had the last time she was in the hospital for this, and I was concerned that this was related to taking vancomycin.  She was switched off of vancomycin yesterday and does seem to be better.  She says she still has some headache and nausea, but not much.  She has had a T-max of about 99.1. Her blood pressure is still up somewhat.  Her chest is clear.  Her heart is regular.  She has morbid obesity, significant lymphedema in both legs left more than right, and she still has what seems to be some cellulitis in the left leg.  ASSESSMENT:  Though then is that she has diabetes and has been pretty well controlled.  She has cellulitis of her leg.  She has lymphedema I think.  She had a reaction to vancomycin.  I think she is overall better and my plan is to continue with her antibiotics, etc.  She is going to get a PICC line and then we discussed the possibility that she might be able to go home, and finish her antibiotics at home.  If she wants to do that and she understands that is a possibility.  I did not change any other medications, and she will continue with what she is doing.     Modesta Sammons L. [name], M.D.     ELH/MEDQ  D:  03/11/2012  T:  03/11/2012  Job:  [id number]

## 2012-03-12 LAB — GLUCOSE, CAPILLARY
Glucose-Capillary: 173 mg/dL — ABNORMAL HIGH (ref 70–99)
Glucose-Capillary: 204 mg/dL — ABNORMAL HIGH (ref 70–99)

## 2012-03-12 MED ORDER — DOCUSATE SODIUM 100 MG PO CAPS
100.0000 mg | ORAL_CAPSULE | Freq: Two times a day (BID) | ORAL | Status: DC
  Administered 2012-03-12 (×2): 100 mg via ORAL
  Filled 2012-03-12 (×3): qty 1

## 2012-03-12 NOTE — Progress Notes (Signed)
Subjective: She says she feels much better. She is somewhat constipated. Her leg is improved and she does not have any inflammation. Her blood sugar has been okay  Objective: Vital signs in last 24 hours: Temp:  [98.2 F (36.8 C)-98.7 F (37.1 C)] 98.3 F (36.8 C) (03/24 0414) Pulse Rate:  [73-76] 73  (03/24 0414) Resp:  [17-18] 18  (03/24 0414) BP: (135-167)/(85-101) 135/85 mmHg (03/24 0414) SpO2:  [91 %-97 %] 97 % (03/24 0414) Weight change:  Last BM Date: 03/08/12  Intake/Output from previous day: 03/23 0701 - 03/24 0700 In: 2480 [P.O.:620; I.V.:1608; IV Piggyback:252] Out: -   PHYSICAL EXAM General appearance: alert, cooperative, no distress and morbidly obese Resp: clear to auscultation bilaterally Cardio: regular rate and rhythm, S1, S2 normal, no murmur, click, rub or gallop GI: soft, non-tender; bowel sounds normal; no masses,  no organomegaly Extremities: Much improved with less inflammation and no heat  Lab Results:    Basic Metabolic Panel:  Basename 03/11/12 0410  NA 135  K 4.2  CL 99  CO2 28  GLUCOSE 173*  BUN 21  CREATININE 1.37*  CALCIUM 9.3  MG --  PHOS --   Liver Function Tests: No results found for this basename: AST:2,ALT:2,ALKPHOS:2,BILITOT:2,PROT:2,ALBUMIN:2 in the last 72 hours No results found for this basename: LIPASE:2,AMYLASE:2 in the last 72 hours No results found for this basename: AMMONIA:2 in the last 72 hours CBC:  Basename 03/11/12 0410  WBC 7.1  NEUTROABS 4.1  HGB 9.7*  HCT 29.7*  MCV 91.4  PLT 230   Cardiac Enzymes: No results found for this basename: CKTOTAL:3,CKMB:3,CKMBINDEX:3,TROPONINI:3 in the last 72 hours BNP: No results found for this basename: PROBNP:3 in the last 72 hours D-Dimer: No results found for this basename: DDIMER:2 in the last 72 hours CBG:  Basename 03/12/12 0728 03/11/12 2155 03/11/12 1626 03/11/12 1211 03/11/12 0730 03/10/12 2127  GLUCAP 176* 203* 141* 186* 164* 161*   Hemoglobin A1C: No  results found for this basename: HGBA1C in the last 72 hours Fasting Lipid Panel: No results found for this basename: CHOL,HDL,LDLCALC,TRIG,CHOLHDL,LDLDIRECT in the last 72 hours Thyroid Function Tests: No results found for this basename: TSH,T4TOTAL,FREET4,T3FREE,THYROIDAB in the last 72 hours Anemia Panel: No results found for this basename: VITAMINB12,FOLATE,FERRITIN,TIBC,IRON,RETICCTPCT in the last 72 hours Coagulation: No results found for this basename: LABPROT:2,INR:2 in the last 72 hours Urine Drug Screen: Drugs of Abuse  No results found for this basename: labopia, cocainscrnur, labbenz, amphetmu, thcu, labbarb    Alcohol Level: No results found for this basename: ETH:2 in the last 72 hours Urinalysis: No results found for this basename: COLORURINE:2,APPERANCEUR:2,LABSPEC:2,PHURINE:2,GLUCOSEU:2,HGBUR:2,BILIRUBINUR:2,KETONESUR:2,PROTEINUR:2,UROBILINOGEN:2,NITRITE:2,LEUKOCYTESUR:2 in the last 72 hours Misc. Labs:  ABGS No results found for this basename: PHART,PCO2,PO2ART,TCO2,HCO3 in the last 72 hours CULTURES Recent Results (from the past 240 hour(s))  URINE CULTURE     Status: Normal   Collection Time   03/08/12  2:39 AM      Component Value Range Status Comment   Specimen Description URINE, CATHETERIZED   Final    Special Requests NONE   Final    Culture  Setup Time [id number]   Final    Colony Count NO GROWTH   Final    Culture NO GROWTH   Final    Report Status 03/09/2012 FINAL   Final   MRSA PCR SCREENING     Status: Normal   Collection Time   03/09/12 10:57 AM      Component Value Range Status Comment   MRSA by PCR NEGATIVE  NEGATIVE  Final   CULTURE, BLOOD (ROUTINE X 2)     Status: Normal (Preliminary result)   Collection Time   03/10/12  8:12 AM      Component Value Range Status Comment   Specimen Description Blood LEFT HAND   Final    Special Requests NONE 6CC   Final    Culture NO GROWTH 2 DAYS   Final    Report Status PENDING   Incomplete   CULTURE,  BLOOD (ROUTINE X 2)     Status: Normal (Preliminary result)   Collection Time   03/10/12  8:14 AM      Component Value Range Status Comment   Specimen Description Blood LEFT ARM   Final    Special Requests NONE [facility]   Final    Culture NO GROWTH 2 DAYS   Final    Report Status PENDING   Incomplete    Studies/Results: Dg Chest Port 1 View  03/11/2012  *RADIOLOGY REPORT*  Clinical Data: Confirm PICC line placement  PORTABLE CHEST - 1 VIEW  Comparison: 11/14/2011  Findings: Right arm PICC with its tip at the cavoatrial junction.  The lungs are essentially clear, noting active bilateral lower lobe atelectasis. No pleural effusion or pneumothorax.  Mild cardiomegaly.  IMPRESSION: Right arm PICC with its tip at the cavoatrial junction.  No pneumothorax.  Original Report Authenticated By: Julian Hy, M.D.    Medications:  Prior to Admission:  Prescriptions prior to admission  Medication Sig Dispense Refill  . acetaminophen (TYLENOL) 325 MG tablet Take 650 mg by mouth every 6 (six) hours as needed. fever      . amLODipine (NORVASC) 10 MG tablet Take 1 tablet (10 mg total) by mouth daily.  30 tablet  0  . enalapril (VASOTEC) 2.5 MG tablet Take 2.5 mg by mouth daily.      Marland Kitchen HYDROcodone-acetaminophen (NORCO) 10-325 MG per tablet Take 1 tablet by mouth every 6 (six) hours as needed.      . insulin regular (NOVOLIN R,HUMULIN R) 100 units/mL injection Inject 60 Units into the skin 2 (two) times daily.      . metoprolol (LOPRESSOR) 50 MG tablet Take 1 tablet (50 mg total) by mouth 2 (two) times daily.  60 tablet  0  . pantoprazole (PROTONIX) 40 MG tablet Take 1 tablet (40 mg total) by mouth 2 (two) times daily before a meal.  60 tablet  0  . potassium chloride 20 MEQ/15ML (10%) solution Take by mouth daily.      Marland Kitchen torsemide (DEMADEX) 100 MG tablet Take 50 mg by mouth 2 (two) times daily.        Scheduled:   . ceFTAROline (TEFLARO) IV  600 mg Intravenous Q12H  . docusate sodium  100 mg Oral BID  .  heparin  5,000 Units Subcutaneous Q8H  . insulin aspart  0-15 Units Subcutaneous TID WC  . insulin aspart  0-5 Units Subcutaneous QHS  . insulin NPH  30 Units Subcutaneous BID AC  . levofloxacin (LEVAQUIN) IV  750 mg Intravenous Q24H  . metoprolol  50 mg Oral BID  . pantoprazole  40 mg Oral BID AC  . potassium chloride  20 mEq Oral BID  . sodium chloride  10-40 mL Intracatheter Q12H  . torsemide  50 mg Oral BID   Continuous:   . sodium chloride 125 mL/hr at 03/12/12 0207   UEA:[id number], acetaminophen, albuterol, HYDROcodone-acetaminophen, morphine, ondansetron (ZOFRAN) IV, ondansetron, promethazine, sodium chloride  Assesment: She had cellulitis of  the leg which is improving. She has chronic lymphedema. She is morbidly obese. She had problems tolerating vancomycin which I do not believe is a true allergy Principal Problem:  *Cellulitis of left leg Active Problems:  Lymphedema of lower extremity  Diabetes mellitus  Morbid obesity  Acute renal failure  Anemia  HTN (hypertension)    Plan: I will add Colace she may be a with the discharge tomorrow to finish her antibiotics at home.    LOS: 4 days   Deloyce Walthers L 03/12/2012, 10:13 AM

## 2012-03-13 LAB — GLUCOSE, CAPILLARY: Glucose-Capillary: 148 mg/dL — ABNORMAL HIGH (ref 70–99)

## 2012-03-13 MED ORDER — LEVOFLOXACIN 500 MG PO TABS: 500.0000 mg | ORAL_TABLET | Freq: Every day | ORAL | Status: AC

## 2012-03-13 NOTE — Progress Notes (Signed)
   CARE MANAGEMENT NOTE 03/13/2012  Patient:  CINZIA, DEVOS   Account Number:  [id number]  Date Initiated:  03/08/2012  Documentation initiated by:  Theophilus Kinds  Subjective/Objective Assessment:   Pt admitted fromhome with cellulitis of leg. Pt lives with husband and does not work. Pt stated she just got her medications refilled. Pt has used [facility] in the past and if needs HH at discharge would like to use them again.     Action/Plan:   Madelaine Etienne, financial counselor, consulted to discuss hospital bill. May need to help pt with medications at discharge.   Anticipated DC Date:  03/15/2012   Anticipated DC Plan:  [place]  CM consult      [facility] Choice  HOME HEALTH   Choice offered to / List presented to:  C-1 Patient        [place] arranged  HH-1 RN  [employer].   Status of service:  Completed, signed off Medicare Important Message given?  NA - LOS <3 / Initial given by admissions (If response is "NO", the following Medicare IM given date fields will be blank) Date Medicare IM given:   Date Additional Medicare IM given:    Discharge Disposition:  [place]  Per UR Regulation:    If discussed at Long Length of Stay Meetings, dates discussed:    Comments:  03/13/12 [place], RN BSN CM Pt discharged home with IV AB, Tefloro IV BID 600 mg. [facility] arranged for RN and PT. Romualdo Bolk is aware and will collect information from pts chart. [facility] added to pts AVS. Pt nurse made aware. No DME needs at this time.  03/08/12 [place], RN BSN CM Pt admitted from home. Has no insurance. Financial counselor consulted. Will monitor for discharge needs.

## 2012-03-13 NOTE — Progress Notes (Signed)
Discharge instructions given. Patient verbalized understanding. Patient was discharged from the floor via wheelchair with staff. Patient was in stable condition. All questions answered prior to discharge.

## 2012-03-13 NOTE — Discharge Summary (Signed)
Physician Discharge Summary  Patient ID: [name] MRN: [id number] DOB/AGE: Aug 09, 1966 46 y.o. Primary Care Physician:Gail Creekmore L, MD, MD Admit date: 03/08/2012 Discharge date: 03/13/2012    Discharge Diagnoses:   Principal Problem:  *Cellulitis of left leg Active Problems:  Lymphedema of lower extremity  Diabetes mellitus  Morbid obesity  Acute renal failure  Anemia  HTN (hypertension)  vancomycin intolerance  Medication List  As of 03/13/2012  8:40 AM   TAKE these medications         acetaminophen 325 MG tablet   Commonly known as: TYLENOL   Take 650 mg by mouth every 6 (six) hours as needed. fever      amLODipine 10 MG tablet   Commonly known as: NORVASC   Take 1 tablet (10 mg total) by mouth daily.      enalapril 2.5 MG tablet   Commonly known as: VASOTEC   Take 2.5 mg by mouth daily.      HYDROcodone-acetaminophen 10-325 MG per tablet   Commonly known as: NORCO   Take 1 tablet by mouth every 6 (six) hours as needed.      insulin regular 100 units/mL injection   Commonly known as: NOVOLIN R,HUMULIN R   Inject 60 Units into the skin 2 (two) times daily.      levofloxacin 500 MG tablet   Commonly known as: LEVAQUIN   Take 1 tablet (500 mg total) by mouth daily.      metoprolol 50 MG tablet   Commonly known as: LOPRESSOR   Take 1 tablet (50 mg total) by mouth 2 (two) times daily.      pantoprazole 40 MG tablet   Commonly known as: PROTONIX   Take 1 tablet (40 mg total) by mouth 2 (two) times daily before a meal.      potassium chloride 20 MEQ/15ML (10%) solution   Take by mouth daily.      torsemide 100 MG tablet   Commonly known as: DEMADEX   Take 50 mg by mouth 2 (two) times daily.            Discharged Condition: Improved    Consults: None  Significant Diagnostic Studies: Dg Chest Port 1 View  03/11/2012  *RADIOLOGY REPORT*  Clinical Data: Confirm PICC line placement  PORTABLE CHEST - 1 VIEW  Comparison: 11/14/2011  Findings:  Right arm PICC with its tip at the cavoatrial junction.  The lungs are essentially clear, noting active bilateral lower lobe atelectasis. No pleural effusion or pneumothorax.  Mild cardiomegaly.  IMPRESSION: Right arm PICC with its tip at the cavoatrial junction.  No pneumothorax.  Original Report Authenticated By: Julian Hy, M.D.    Lab Results: Basic Metabolic Panel:  Basename 03/11/12 0410  NA 135  K 4.2  CL 99  CO2 28  GLUCOSE 173*  BUN 21  CREATININE 1.37*  CALCIUM 9.3  MG --  PHOS --   Liver Function Tests: No results found for this basename: AST:2,ALT:2,ALKPHOS:2,BILITOT:2,PROT:2,ALBUMIN:2 in the last 72 hours   CBC:  Basename 03/11/12 0410  WBC 7.1  NEUTROABS 4.1  HGB 9.7*  HCT 29.7*  MCV 91.4  PLT 230    Recent Results (from the past 240 hour(s))  URINE CULTURE     Status: Normal   Collection Time   03/08/12  2:39 AM      Component Value Range Status Comment   Specimen Description URINE, CATHETERIZED   Final    Special Requests NONE   Final  Culture  Setup Time [id number]   Final    Colony Count NO GROWTH   Final    Culture NO GROWTH   Final    Report Status 03/09/2012 FINAL   Final   MRSA PCR SCREENING     Status: Normal   Collection Time   03/09/12 10:57 AM      Component Value Range Status Comment   MRSA by PCR NEGATIVE  NEGATIVE  Final   CULTURE, BLOOD (ROUTINE X 2)     Status: Normal (Preliminary result)   Collection Time   03/10/12  8:12 AM      Component Value Range Status Comment   Specimen Description Blood LEFT HAND   Final    Special Requests NONE 6CC   Final    Culture NO GROWTH 2 DAYS   Final    Report Status PENDING   Incomplete   CULTURE, BLOOD (ROUTINE X 2)     Status: Normal (Preliminary result)   Collection Time   03/10/12  8:14 AM      Component Value Range Status Comment   Specimen Description Blood LEFT ARM   Final    Special Requests NONE 6CC   Final    Culture NO GROWTH 2 DAYS   Final    Report Status PENDING    Incomplete      Hospital Course: She was admitted with cellulitis of the leg. She was started on vancomycin and Levaquin and initially improved. However then she began having headaches nausea vomiting and fever which is similar to a problem that she had with vancomycin previously. Vancomycin was discontinued and she was started on Teflaro and continued on Levaquin. She improved markedly. He was on sliding scale insulin and received diuretics  Discharge Exam: Blood pressure 155/96, pulse 72, temperature 98.1 F (36.7 C), temperature source Oral, resp. rate 20, height _0  (1.651 m), weight 162.8 kg (358 lb 14.5 oz), SpO2 93.00%. She has lymphedema of both legs the left is worse in the right. The area of cellulitis on her left thigh is much improved. She is morbidly obese. Her chest is clear. She is awake and alert. Her heart is regular. Her abdomen is soft without masses  Disposition: Home with home health services and IV antibiotics  Discharge Orders    Future Orders Please Complete By Expires   Home Health      Questions: Responses:   To provide the following care/treatments PT    RN   Face-to-face encounter      Comments:   I Lilyan Prete L certify that this patient is under my care and that I, or a nurse practitioner or physician's assistant working with me, had a face-to-face encounter that meets the physician face-to-face encounter requirements with this patient on 03/13/2012.       Questions: Responses:   The encounter with the patient was in whole, or in part, for the following medical condition, which is the primary reason for home health care Cellulitis of leg   I certify that, based on my findings, the following services are medically necessary home health services Nursing    Physical therapy   My clinical findings support the need for the above services Infection requiring IV antibiotics   Further, I certify that my clinical findings support that this patient is homebound  (i.e. absences from home require considerable and taxing effort and are for medical reasons or religious services or infrequently or of short duration when for other reasons) Ambulates short  distances less than 300 feet   To provide the following care/treatments PT    RN   Discharge patient         Follow-up Information    Follow up with Alonza Bogus, MD .         Signed: Alonza Bogus Pager [phone]  03/13/2012, 8:40 AM

## 2012-03-13 NOTE — Discharge Instructions (Addendum)
[facility] registered nurse and physical therapy. [facility] will be coming to administer IV antibiotics.Cellulitis Cellulitis is an infection of the skin and the tissue beneath it. The area is typically red and tender. It is caused by germs (bacteria) (usually staph or strep) that enter the body through cuts or sores. Cellulitis most commonly occurs in the arms or lower legs.  HOME CARE INSTRUCTIONS   If you are given a prescription for medications which kill germs (antibiotics), take as directed until finished.   If the infection is on the arm or leg, keep the limb elevated as able.   Use a warm cloth several times per day to relieve pain and encourage healing.   See your caregiver for recheck of the infected site as directed if problems arise.   Only take over-the-counter or prescription medicines for pain, discomfort, or fever as directed by your caregiver.  SEEK MEDICAL CARE IF:   The area of redness (inflammation) is spreading, there are red streaks coming from the infected site, or if a part of the infection begins to turn dark in color.   The joint or bone underneath the infected skin becomes painful after the skin has healed.   The infection returns in the same or another area after it seems to have gone away.   A boil or bump swells up. This may be an abscess.   New, unexplained problems such as pain or fever develop.  SEEK IMMEDIATE MEDICAL CARE IF:   You have a fever.   You or your child feels drowsy or lethargic.   There is vomiting, diarrhea, or lasting discomfort or feeling ill (malaise) with muscle aches and pains.  MAKE SURE YOU:   Understand these instructions.   Will watch your condition.   Will get help right away if you are not doing well or get worse.  Document Released: 09/15/2005 Document Revised: 11/25/2011 Document Reviewed: 07/24/2008 [facility] Patient Information 2012 [place].Cellulitis Cellulitis is an infection of the skin  and the tissue beneath it. The area is typically red and tender. It is caused by germs (bacteria) (usually staph or strep) that enter the body through cuts or sores. Cellulitis most commonly occurs in the arms or lower legs.  HOME CARE INSTRUCTIONS   If you are given a prescription for medications which kill germs (antibiotics), take as directed until finished.   If the infection is on the arm or leg, keep the limb elevated as able.   Use a warm cloth several times per day to relieve pain and encourage healing.   See your caregiver for recheck of the infected site as directed if problems arise.   Only take over-the-counter or prescription medicines for pain, discomfort, or fever as directed by your caregiver.  SEEK MEDICAL CARE IF:   The area of redness (inflammation) is spreading, there are red streaks coming from the infected site, or if a part of the infection begins to turn dark in color.   The joint or bone underneath the infected skin becomes painful after the skin has healed.   The infection returns in the same or another area after it seems to have gone away.   A boil or bump swells up. This may be an abscess.   New, unexplained problems such as pain or fever develop.  SEEK IMMEDIATE MEDICAL CARE IF:   You have a fever.   You or your child feels drowsy or lethargic.   There is vomiting, diarrhea, or lasting  discomfort or feeling ill (malaise) with muscle aches and pains.  MAKE SURE YOU:   Understand these instructions.   Will watch your condition.   Will get help right away if you are not doing well or get worse.  Document Released: 09/15/2005 Document Revised: 11/25/2011 Document Reviewed: 07/24/2008 [facility] Patient Information 2012 [place].Peripherally Inserted Central Catheter (PICC) Home Guide A peripherally inserted central catheter (PICC) is a long, thin, flexible tube that is inserted into a vein in the upper arm. It is a form of intravenous (IV)  access. It is considered to be a "central" line because the tip of the PICC ends in a large vein in your chest. This large vein is called the superior vena cava (SVC). The PICC tip ends in the SVC because there is a lot of blood flow in the SVC. This allows medicines and IV fluids to be quickly distributed throughout the body. The PICC is inserted using a sterile technique by a specially trained nurse or physician. After the PICC is inserted, a chest X-ray is done to be sure it is in the correct place.  A PICC may be placed for different reasons, such as:  To give medicines and liquid nutrition that can only be given through a central line. Examples are:   Certain antibiotic treatments.   Chemotherapy.   Total parenteral nutrition (TPN).   To take frequent blood samples.   To give IV fluids and blood products.   If there is difficulty placing a peripheral intravenous (PIV) catheter.  If taken care of properly, a PICC can remain in place for several months. A PICC can also allow patients to go home early. Medicine and PICC care can be managed at home by a family member or home healthcare team. RISKS AND COMPLICATIONS Possible problems with a PICC can occasionally occur. This may include:  A clot (thrombus) forming in or at the tip of the PICC. This can cause the PICC to become clogged. A "clot-busting" medicine called tissue plasminogen activator (tPA) can be inserted into the PICC to help break up the clot.   Inflammation of the vein (phlebitis) in which the PICC is placed. Signs of inflammation may include redness, pain at the insertion site, red streaks, or being able to feel a "cord" in the vein where the PICC is located.   Infection in the PICC or at the insertion site. Signs of infection may include fever, chills, redness, swelling, or pus drainage from the PICC insertion site.   PICC movement (malposition). The PICC tip may migrate from its original position due to excessive physical  activity, forceful coughing, sneezing, or vomiting.   A break or cut in the PICC. It is important to not use scissors near the PICC.   Nerve or tendon irritation or injury during PICC insertion.  HOME CARE INSTRUCTIONS Activity  You may bend your arm and move it freely. If your PICC is near or at the bend of your elbow, avoid activity with repeated motion at the elbow.   Avoid lifting heavy objects as instructed by your caregiver.   Avoid using a crutch with the arm on the same side as your PICC. You may need to use a walker.  PICC Dressing  Keep your PICC bandage (dressing) clean and dry to prevent infection.   Ask your caregiver when you may shower. Ask your caregiver to teach you how to wrap the PICC when you do take a shower.   Do not bathe, swim, or  use hot tubs when you have a PICC.   Change the PICC dressing as instructed by your caregiver.   Change your PICC dressing if it becomes loose or wet.  General PICC Care  Check the PICC insertion site daily for leakage, redness, swelling, or pain.   Flush the PICC as directed by your caregiver. Let your caregiver know right away if the PICC is difficult to flush or does not flush. Do not use force to flush the PICC.   Do not use a syringe that is less than 10 mLs to flush the PICC.   Never pull or tug on the PICC.   Avoid blood pressure checks on the arm with the PICC.   Keep your PICC identification card with you at all times.   Do not take the PICC out yourself. Only a trained clinical professional should remove the PICC.  SEEK IMMEDIATE MEDICAL CARE IF:  Your PICC is accidently pulled all the way out. If this happens, cover the insertion site with a bandage or gauze dressing. Do not throw the PICC away. Your caregiver will need to inspect it.   Your PICC was tugged or pulled and has partially come out. Do not  push the PICC back in.   There is any type of drainage, redness, or swelling where the PICC enters the skin.     You cannot flush the PICC, it is difficult to flush, or the PICC leaks around the insertion site when it is flushed.   You hear a "flushing" sound when the PICC is flushed.   You have pain, discomfort, or numbness in your arm, shoulder, or jaw on the same side as the PICC .   You feel your heart "racing" or skipping beats.   You notice a hole or tear in the PICC.   You develop chills or a fever.  MAKE SURE YOU:   Understand these instructions.   Will watch your condition.   Will get help right away if you are not doing well or get worse.  Document Released: 06/12/2003 Document Revised: 11/25/2011 Document Reviewed: 04/12/2011 [facility] Patient Information 2012 [place].

## 2012-03-13 NOTE — Progress Notes (Signed)
Subjective: She feels well and like to go home. She has had minimal nausea. Her constipation is better. Her blood sugar has been better. Her leg feels well.  Objective: Vital signs in last 24 hours: Temp:  [98 F (36.7 C)-98.3 F (36.8 C)] 98.1 F (36.7 C) (03/25 0414) Pulse Rate:  [72-77] 72  (03/25 0414) Resp:  [19-20] 20  (03/25 0414) BP: (146-166)/(91-96) 155/96 mmHg (03/25 0414) SpO2:  [93 %-97 %] 93 % (03/25 0414) Weight change:  Last BM Date: 03/13/12  Intake/Output from previous day: 03/24 0701 - 03/25 0700 In: 3850 [P.O.:1080; I.V.:2420; IV Piggyback:350] Out: 1200 [Urine:1200]  PHYSICAL EXAM General appearance: alert, cooperative and morbidly obese Resp: clear to auscultation bilaterally Cardio: regular rate and rhythm, S1, S2 normal, no murmur, click, rub or gallop GI: soft, non-tender; bowel sounds normal; no masses,  no organomegaly Extremities: Lymphedema bilaterally left greater than right in the changes of cellulitis are much improved  Lab Results:    Basic Metabolic Panel:  Basename 03/11/12 0410  NA 135  K 4.2  CL 99  CO2 28  GLUCOSE 173*  BUN 21  CREATININE 1.37*  CALCIUM 9.3  MG --  PHOS --   Liver Function Tests: No results found for this basename: AST:2,ALT:2,ALKPHOS:2,BILITOT:2,PROT:2,ALBUMIN:2 in the last 72 hours No results found for this basename: LIPASE:2,AMYLASE:2 in the last 72 hours No results found for this basename: AMMONIA:2 in the last 72 hours CBC:  Basename 03/11/12 0410  WBC 7.1  NEUTROABS 4.1  HGB 9.7*  HCT 29.7*  MCV 91.4  PLT 230   Cardiac Enzymes: No results found for this basename: CKTOTAL:3,CKMB:3,CKMBINDEX:3,TROPONINI:3 in the last 72 hours BNP: No results found for this basename: PROBNP:3 in the last 72 hours D-Dimer: No results found for this basename: DDIMER:2 in the last 72 hours CBG:  Basename 03/13/12 0725 03/12/12 2020 03/12/12 1657 03/12/12 1117 03/12/12 0728 03/11/12 2155  GLUCAP 148* 204* 173*  191* 176* 203*   Hemoglobin A1C: No results found for this basename: HGBA1C in the last 72 hours Fasting Lipid Panel: No results found for this basename: CHOL,HDL,LDLCALC,TRIG,CHOLHDL,LDLDIRECT in the last 72 hours Thyroid Function Tests: No results found for this basename: TSH,T4TOTAL,FREET4,T3FREE,THYROIDAB in the last 72 hours Anemia Panel: No results found for this basename: VITAMINB12,FOLATE,FERRITIN,TIBC,IRON,RETICCTPCT in the last 72 hours Coagulation: No results found for this basename: LABPROT:2,INR:2 in the last 72 hours Urine Drug Screen: Drugs of Abuse  No results found for this basename: labopia, cocainscrnur, labbenz, amphetmu, thcu, labbarb    Alcohol Level: No results found for this basename: ETH:2 in the last 72 hours Urinalysis: No results found for this basename: COLORURINE:2,APPERANCEUR:2,LABSPEC:2,PHURINE:2,GLUCOSEU:2,HGBUR:2,BILIRUBINUR:2,KETONESUR:2,PROTEINUR:2,UROBILINOGEN:2,NITRITE:2,LEUKOCYTESUR:2 in the last 72 hours Misc. Labs:  ABGS No results found for this basename: PHART,PCO2,PO2ART,TCO2,HCO3 in the last 72 hours CULTURES Recent Results (from the past 240 hour(s))  URINE CULTURE     Status: Normal   Collection Time   03/08/12  2:39 AM      Component Value Range Status Comment   Specimen Description URINE, CATHETERIZED   Final    Special Requests NONE   Final    Culture  Setup Time [id number]   Final    Colony Count NO GROWTH   Final    Culture NO GROWTH   Final    Report Status 03/09/2012 FINAL   Final   MRSA PCR SCREENING     Status: Normal   Collection Time   03/09/12 10:57 AM      Component Value Range Status Comment  MRSA by PCR NEGATIVE  NEGATIVE  Final   CULTURE, BLOOD (ROUTINE X 2)     Status: Normal (Preliminary result)   Collection Time   03/10/12  8:12 AM      Component Value Range Status Comment   Specimen Description Blood LEFT HAND   Final    Special Requests NONE 6CC   Final    Culture NO GROWTH 2 DAYS   Final    Report  Status PENDING   Incomplete   CULTURE, BLOOD (ROUTINE X 2)     Status: Normal (Preliminary result)   Collection Time   03/10/12  8:14 AM      Component Value Range Status Comment   Specimen Description Blood LEFT ARM   Final    Special Requests NONE [facility]   Final    Culture NO GROWTH 2 DAYS   Final    Report Status PENDING   Incomplete    Studies/Results: Dg Chest Port 1 View  03/11/2012  *RADIOLOGY REPORT*  Clinical Data: Confirm PICC line placement  PORTABLE CHEST - 1 VIEW  Comparison: 11/14/2011  Findings: Right arm PICC with its tip at the cavoatrial junction.  The lungs are essentially clear, noting active bilateral lower lobe atelectasis. No pleural effusion or pneumothorax.  Mild cardiomegaly.  IMPRESSION: Right arm PICC with its tip at the cavoatrial junction.  No pneumothorax.  Original Report Authenticated By: Julian Hy, M.D.    Medications:  Prior to Admission:  Prescriptions prior to admission  Medication Sig Dispense Refill  . acetaminophen (TYLENOL) 325 MG tablet Take 650 mg by mouth every 6 (six) hours as needed. fever      . amLODipine (NORVASC) 10 MG tablet Take 1 tablet (10 mg total) by mouth daily.  30 tablet  0  . enalapril (VASOTEC) 2.5 MG tablet Take 2.5 mg by mouth daily.      Marland Kitchen HYDROcodone-acetaminophen (NORCO) 10-325 MG per tablet Take 1 tablet by mouth every 6 (six) hours as needed.      . insulin regular (NOVOLIN R,HUMULIN R) 100 units/mL injection Inject 60 Units into the skin 2 (two) times daily.      . metoprolol (LOPRESSOR) 50 MG tablet Take 1 tablet (50 mg total) by mouth 2 (two) times daily.  60 tablet  0  . pantoprazole (PROTONIX) 40 MG tablet Take 1 tablet (40 mg total) by mouth 2 (two) times daily before a meal.  60 tablet  0  . potassium chloride 20 MEQ/15ML (10%) solution Take by mouth daily.      Marland Kitchen torsemide (DEMADEX) 100 MG tablet Take 50 mg by mouth 2 (two) times daily.        Scheduled:   . ceFTAROline (TEFLARO) IV  600 mg Intravenous Q12H    . docusate sodium  100 mg Oral BID  . heparin  5,000 Units Subcutaneous Q8H  . insulin aspart  0-15 Units Subcutaneous TID WC  . insulin aspart  0-5 Units Subcutaneous QHS  . insulin NPH  30 Units Subcutaneous BID AC  . levofloxacin (LEVAQUIN) IV  750 mg Intravenous Q24H  . metoprolol  50 mg Oral BID  . pantoprazole  40 mg Oral BID AC  . potassium chloride  20 mEq Oral BID  . sodium chloride  10-40 mL Intracatheter Q12H  . torsemide  50 mg Oral BID   Continuous:   . sodium chloride 125 mL/hr at 03/13/12 0509   LFY:BOFBPZWCHENID, acetaminophen, albuterol, HYDROcodone-acetaminophen, morphine, ondansetron (ZOFRAN) IV, ondansetron, promethazine, sodium chloride  Assesment: She has cellulitis of the leg which is much better. She wants to go home. I'm seeing if I can arrange for her to be treated with outpatient IV antibiotics for another week. She can take Levaquin by mouth. Principal Problem:  *Cellulitis of left leg Active Problems:  Lymphedema of lower extremity  Diabetes mellitus  Morbid obesity  Acute renal failure  Anemia  HTN (hypertension)    Plan: Discharge today if we can make arrangements    LOS: 5 days   Raynelle Fujikawa L 03/13/2012, 8:11 AM

## 2012-03-14 NOTE — Progress Notes (Signed)
Utilization review completed.  

## 2012-03-15 LAB — CULTURE, BLOOD (ROUTINE X 2)

## 2012-06-30 ENCOUNTER — Inpatient Hospital Stay (HOSPITAL_COMMUNITY)
Admission: EM | Admit: 2012-06-30 | Discharge: 2012-07-08 | DRG: 603 | Disposition: A | Discharge status: Home Health | Payer: Medicaid Other | Attending: Pulmonary Disease | Admitting: Pulmonary Disease

## 2012-06-30 ENCOUNTER — Encounter (HOSPITAL_COMMUNITY): Payer: Self-pay

## 2012-06-30 ENCOUNTER — Emergency Department (HOSPITAL_COMMUNITY): Payer: Medicaid Other

## 2012-06-30 DIAGNOSIS — Z881 Allergy status to other antibiotic agents status: Secondary | ICD-10-CM

## 2012-06-30 DIAGNOSIS — Q054 Unspecified spina bifida with hydrocephalus: Secondary | ICD-10-CM

## 2012-06-30 DIAGNOSIS — Z91013 Allergy to seafood: Secondary | ICD-10-CM

## 2012-06-30 DIAGNOSIS — I129 Hypertensive chronic kidney disease with stage 1 through stage 4 chronic kidney disease, or unspecified chronic kidney disease: Secondary | ICD-10-CM | POA: Diagnosis present

## 2012-06-30 DIAGNOSIS — Z887 Allergy status to serum and vaccine status: Secondary | ICD-10-CM

## 2012-06-30 DIAGNOSIS — I2510 Atherosclerotic heart disease of native coronary artery without angina pectoris: Secondary | ICD-10-CM | POA: Diagnosis present

## 2012-06-30 DIAGNOSIS — Z91041 Radiographic dye allergy status: Secondary | ICD-10-CM

## 2012-06-30 DIAGNOSIS — L03116 Cellulitis of left lower limb: Secondary | ICD-10-CM | POA: Diagnosis present

## 2012-06-30 DIAGNOSIS — Z91018 Allergy to other foods: Secondary | ICD-10-CM

## 2012-06-30 DIAGNOSIS — E7800 Pure hypercholesterolemia, unspecified: Secondary | ICD-10-CM | POA: Diagnosis present

## 2012-06-30 DIAGNOSIS — I890 Lymphedema, not elsewhere classified: Secondary | ICD-10-CM | POA: Diagnosis present

## 2012-06-30 DIAGNOSIS — L02419 Cutaneous abscess of limb, unspecified: Principal | ICD-10-CM | POA: Diagnosis present

## 2012-06-30 DIAGNOSIS — I10 Essential (primary) hypertension: Secondary | ICD-10-CM | POA: Diagnosis present

## 2012-06-30 DIAGNOSIS — E119 Type 2 diabetes mellitus without complications: Secondary | ICD-10-CM | POA: Diagnosis present

## 2012-06-30 DIAGNOSIS — E785 Hyperlipidemia, unspecified: Secondary | ICD-10-CM | POA: Diagnosis present

## 2012-06-30 DIAGNOSIS — J45909 Unspecified asthma, uncomplicated: Secondary | ICD-10-CM | POA: Diagnosis present

## 2012-06-30 DIAGNOSIS — I803 Phlebitis and thrombophlebitis of lower extremities, unspecified: Secondary | ICD-10-CM | POA: Diagnosis present

## 2012-06-30 DIAGNOSIS — Z794 Long term (current) use of insulin: Secondary | ICD-10-CM

## 2012-06-30 DIAGNOSIS — Z6841 Body Mass Index (BMI) 40.0 and over, adult: Secondary | ICD-10-CM

## 2012-06-30 DIAGNOSIS — N189 Chronic kidney disease, unspecified: Secondary | ICD-10-CM | POA: Diagnosis present

## 2012-06-30 LAB — BASIC METABOLIC PANEL
CO2: 28 mEq/L (ref 19–32)
Calcium: 9.9 mg/dL (ref 8.4–10.5)
GFR calc non Af Amer: 63 mL/min — ABNORMAL LOW (ref >90)
Glucose, Bld: 252 mg/dL — ABNORMAL HIGH (ref 70–99)
Potassium: 3.6 mEq/L (ref 3.5–5.1)
Sodium: 136 mEq/L (ref 135–145)

## 2012-06-30 LAB — CBC WITH DIFFERENTIAL/PLATELET
Basophils Absolute: 0 10*3/uL (ref 0.0–0.1)
Eosinophils Absolute: 0.2 10*3/uL (ref 0.0–0.7)
Eosinophils Relative: 2 % (ref 0–5)
Lymphocytes Relative: 24 % (ref 12–46)
Lymphs Abs: 2 10*3/uL (ref 0.7–4.0)
Neutrophils Relative %: 70 % (ref 43–77)
Platelets: 314 10*3/uL (ref 150–400)
RBC: 3.8 MIL/uL — ABNORMAL LOW (ref 3.87–5.11)
RDW: 14.9 % (ref 11.5–15.5)
WBC: 8.4 10*3/uL (ref 4.0–10.5)

## 2012-06-30 LAB — LACTIC ACID, PLASMA: Lactic Acid, Venous: 1.5 mmol/L (ref 0.5–2.2)

## 2012-06-30 MED ORDER — ONDANSETRON HCL 4 MG/2ML IJ SOLN
4.0000 mg | Freq: Four times a day (QID) | INTRAMUSCULAR | Status: DC | PRN
  Administered 2012-07-01 – 2012-07-03 (×4): 4 mg via INTRAVENOUS
  Filled 2012-06-30 (×4): qty 2

## 2012-06-30 MED ORDER — CLINDAMYCIN PHOSPHATE 300 MG/50ML IV SOLN
900.0000 mg | Freq: Once | INTRAVENOUS | Status: DC
  Filled 2012-06-30: qty 150

## 2012-06-30 MED ORDER — DEXTROSE 5 % IV SOLN
900.0000 mg | Freq: Once | INTRAVENOUS | Status: DC
  Filled 2012-06-30: qty 6

## 2012-06-30 MED ORDER — AMLODIPINE BESYLATE 5 MG PO TABS
10.0000 mg | ORAL_TABLET | Freq: Every day | ORAL | Status: DC
  Administered 2012-06-30 – 2012-07-08 (×7): 10 mg via ORAL
  Filled 2012-06-30 (×9): qty 2

## 2012-06-30 MED ORDER — INSULIN ASPART 100 UNIT/ML ~~LOC~~ SOLN
0.0000 [IU] | Freq: Three times a day (TID) | SUBCUTANEOUS | Status: DC
  Administered 2012-07-01: 7 [IU] via SUBCUTANEOUS
  Administered 2012-07-01 – 2012-07-02 (×4): 4 [IU] via SUBCUTANEOUS
  Administered 2012-07-02: 3 [IU] via SUBCUTANEOUS
  Administered 2012-07-03: 4 [IU] via SUBCUTANEOUS
  Administered 2012-07-03: 3 [IU] via SUBCUTANEOUS
  Administered 2012-07-05 – 2012-07-06 (×3): 4 [IU] via SUBCUTANEOUS

## 2012-06-30 MED ORDER — INSULIN ASPART 100 UNIT/ML ~~LOC~~ SOLN
0.0000 [IU] | Freq: Every day | SUBCUTANEOUS | Status: DC
  Administered 2012-06-30: 2 [IU] via SUBCUTANEOUS

## 2012-06-30 MED ORDER — CLINDAMYCIN PHOSPHATE 900 MG/50ML IV SOLN
INTRAVENOUS | Status: AC
  Filled 2012-06-30: qty 50

## 2012-06-30 MED ORDER — POTASSIUM CHLORIDE 20 MEQ/15ML (10%) PO LIQD
20.0000 meq | Freq: Two times a day (BID) | ORAL | Status: DC
  Administered 2012-06-30 – 2012-07-01 (×3): 20 meq via ORAL
  Administered 2012-07-02: 15 meq via ORAL
  Administered 2012-07-02 – 2012-07-08 (×10): 20 meq via ORAL
  Filled 2012-06-30 (×14): qty 30

## 2012-06-30 MED ORDER — ONDANSETRON HCL 4 MG PO TABS: 4.0000 mg | ORAL_TABLET | Freq: Four times a day (QID) | ORAL | Status: DC | PRN

## 2012-06-30 MED ORDER — HYDROCODONE-ACETAMINOPHEN 5-325 MG PO TABS
1.0000 | ORAL_TABLET | ORAL | Status: DC | PRN
Start: 2012-06-30 — End: 2012-07-08
  Administered 2012-06-30 – 2012-07-01 (×2): 2 via ORAL
  Administered 2012-07-01: 1 via ORAL
  Administered 2012-07-01 – 2012-07-02 (×2): 2 via ORAL
  Administered 2012-07-02 – 2012-07-04 (×2): 1 via ORAL
  Administered 2012-07-04: 2 via ORAL
  Administered 2012-07-04: 1 via ORAL
  Filled 2012-06-30 (×3): qty 2
  Filled 2012-06-30 (×4): qty 1
  Filled 2012-06-30 (×2): qty 2

## 2012-06-30 MED ORDER — SODIUM CHLORIDE 0.9 % IV SOLN
INTRAVENOUS | Status: DC
  Administered 2012-06-30 – 2012-07-03 (×2): via INTRAVENOUS
  Administered 2012-07-04: 20 mL/h via INTRAVENOUS

## 2012-06-30 MED ORDER — CLINDAMYCIN PHOSPHATE 900 MG/50ML IV SOLN: 900.0000 mg | Freq: Once | INTRAVENOUS | Status: AC

## 2012-06-30 MED ORDER — HYDROMORPHONE HCL PF 1 MG/ML IJ SOLN
1.0000 mg | INTRAMUSCULAR | Status: DC | PRN
  Administered 2012-07-01 – 2012-07-07 (×12): 1 mg via INTRAVENOUS
  Filled 2012-06-30 (×12): qty 1

## 2012-06-30 MED ORDER — INSULIN GLARGINE 100 UNIT/ML ~~LOC~~ SOLN
40.0000 [IU] | Freq: Two times a day (BID) | SUBCUTANEOUS | Status: DC
  Administered 2012-06-30 – 2012-07-08 (×14): 40 [IU] via SUBCUTANEOUS

## 2012-06-30 MED ORDER — CLINDAMYCIN PHOSPHATE 900 MG/50ML IV SOLN
INTRAVENOUS | Status: AC
  Administered 2012-06-30: 900 mg
  Filled 2012-06-30: qty 50

## 2012-06-30 MED ORDER — PROMETHAZINE HCL 12.5 MG PO TABS
25.0000 mg | ORAL_TABLET | ORAL | Status: DC | PRN
Start: 2012-06-30 — End: 2012-07-08
  Administered 2012-07-03 – 2012-07-06 (×3): 25 mg via ORAL
  Filled 2012-06-30: qty 2
  Filled 2012-06-30 (×3): qty 1

## 2012-06-30 MED ORDER — HYDROMORPHONE HCL PF 1 MG/ML IJ SOLN
1.0000 mg | Freq: Once | INTRAMUSCULAR | Status: AC
  Administered 2012-06-30: 1 mg via INTRAVENOUS
  Filled 2012-06-30: qty 1

## 2012-06-30 MED ORDER — CLINDAMYCIN PHOSPHATE 900 MG/50ML IV SOLN
900.0000 mg | Freq: Three times a day (TID) | INTRAVENOUS | Status: DC
  Administered 2012-06-30 – 2012-07-01 (×2): 900 mg via INTRAVENOUS
  Filled 2012-06-30 (×9): qty 50

## 2012-06-30 MED ORDER — ENOXAPARIN SODIUM 40 MG/0.4ML ~~LOC~~ SOLN
40.0000 mg | SUBCUTANEOUS | Status: DC
  Administered 2012-06-30 – 2012-07-04 (×5): 40 mg via SUBCUTANEOUS
  Filled 2012-06-30 (×5): qty 0.4

## 2012-06-30 MED ORDER — ENALAPRIL MALEATE 5 MG PO TABS
2.5000 mg | ORAL_TABLET | Freq: Every day | ORAL | Status: DC
  Administered 2012-06-30 – 2012-07-08 (×7): 2.5 mg via ORAL
  Filled 2012-06-30 (×5): qty 1
  Filled 2012-06-30: qty 2
  Filled 2012-06-30 (×4): qty 1

## 2012-06-30 MED ORDER — ACETAMINOPHEN 650 MG RE SUPP: 650.0000 mg | Freq: Four times a day (QID) | RECTAL | Status: DC | PRN

## 2012-06-30 MED ORDER — ONDANSETRON HCL 4 MG/2ML IJ SOLN
4.0000 mg | Freq: Once | INTRAMUSCULAR | Status: AC
  Administered 2012-06-30: 4 mg via INTRAVENOUS
  Filled 2012-06-30: qty 2

## 2012-06-30 MED ORDER — TORSEMIDE 20 MG PO TABS
50.0000 mg | ORAL_TABLET | Freq: Two times a day (BID) | ORAL | Status: DC
  Administered 2012-07-01 – 2012-07-08 (×13): 50 mg via ORAL
  Filled 2012-06-30 (×15): qty 3

## 2012-06-30 MED ORDER — ALUM & MAG HYDROXIDE-SIMETH 200-200-20 MG/5ML PO SUSP: 30.0000 mL | Freq: Four times a day (QID) | ORAL | Status: DC | PRN

## 2012-06-30 MED ORDER — ACETAMINOPHEN 325 MG PO TABS
650.0000 mg | ORAL_TABLET | Freq: Four times a day (QID) | ORAL | Status: DC | PRN
  Administered 2012-07-02: 650 mg via ORAL
  Filled 2012-06-30: qty 2

## 2012-06-30 MED ORDER — PANTOPRAZOLE SODIUM 40 MG PO TBEC
40.0000 mg | DELAYED_RELEASE_TABLET | Freq: Two times a day (BID) | ORAL | Status: DC
  Administered 2012-07-01 – 2012-07-08 (×14): 40 mg via ORAL
  Filled 2012-06-30 (×15): qty 1

## 2012-06-30 MED ORDER — METOPROLOL TARTRATE 50 MG PO TABS
50.0000 mg | ORAL_TABLET | Freq: Two times a day (BID) | ORAL | Status: DC
  Administered 2012-06-30 – 2012-07-08 (×13): 50 mg via ORAL
  Filled 2012-06-30 (×9): qty 1
  Filled 2012-06-30: qty 2
  Filled 2012-06-30 (×6): qty 1

## 2012-06-30 NOTE — ED Notes (Signed)
Pt aware of admission request and agrees with staying. Waiting for Hospitalist at this time.

## 2012-06-30 NOTE — ED Provider Notes (Signed)
History     CSN: [id number]  Arrival date & time 06/30/12  1057   First MD Initiated Contact with Patient 06/30/12 1139      Chief Complaint  Patient presents with  . Leg Swelling    (Consider location/radiation/quality/duration/timing/severity/associated sxs/prior treatment) HPI Comments: Patient with hx of recurrent cellulitis and lymph edema c/o pain and redness to her left upper leg for one week.  States symptoms feel similar to previous skin infections.  Pain is worse with weight bearing and movement.  She also c/o fever and chills last evening.  She denies vomiting, chest pain or numbness.    Patient is a 46 y.o. female presenting with rash. The history is provided by the patient.  Rash  This is a recurrent problem. The current episode started more than 1 week ago. The problem has been gradually worsening. The problem is associated with an unknown factor. The maximum temperature recorded prior to her arrival was 100 to 100.9 F. The fever has been present for 1 to 2 days. The rash is present on the left upper leg. The pain is moderate. The pain has been constant since onset. Associated symptoms include pain. Pertinent negatives include no blisters, no itching and no weeping. The treatment provided no relief.    Past Medical History  Diagnosis Date  . Diabetes mellitus   . Chronic pain   . Lymph edema   . Cellulitis   . Chiari malformation     s/p surgery  . S/P colonoscopy 05/26/2007    Dr. Laural Golden sigmoid diverticulosis random biopsies benign  . CAD (coronary artery disease)   . Hypercholesterolemia   . Hypertension   . Asthma   . S/P endoscopy 05/01/2009    Dr. Penelope Coop pill-induced esophageal ulcerations distal to midesophagus, 2 small ulcers in the antrum of the stomach    Past Surgical History  Procedure Date  . Cesarean section      x 2  . Abdominal hysterectomy   . Tonsillectomy   . Adenoidectomy   . Thyroidectomy, partial   . Craniectomy suboccipital w/  cervical laminectomy / chiari     Family History  Problem Relation Age of Onset  . Colon cancer Mother 27  . Stroke Mother 43  . Coronary artery disease Mother     History  Substance Use Topics  . Smoking status: Never Smoker   . Smokeless tobacco: Never Used  . Alcohol Use: No    OB History    Grav Para Term Preterm Abortions TAB SAB Ect Mult Living                  Review of Systems  Constitutional: Positive for fever. Negative for chills, activity change and appetite change.  HENT: Negative for facial swelling, neck pain and neck stiffness.   Cardiovascular: Negative for chest pain.  Gastrointestinal: Negative for nausea, vomiting and abdominal pain.  Genitourinary: Negative for dysuria and difficulty urinating.  Musculoskeletal: Negative for joint swelling and arthralgias.  Skin: Positive for color change and rash. Negative for itching and wound.       Abscess   Neurological: Negative for dizziness, facial asymmetry, light-headedness and headaches.  Hematological: Negative for adenopathy.  All other systems reviewed and are negative.    Allergies  Contrast media; Pineapple; Pneumococcal vaccines; Shellfish allergy; and Vancomycin  Home Medications   Current Outpatient Rx  Name Route Sig Dispense Refill  . ACETAMINOPHEN 325 MG PO TABS Oral Take 650 mg by mouth every 6 (six)  hours as needed. fever    . AMLODIPINE BESYLATE 10 MG PO TABS Oral Take 1 tablet (10 mg total) by mouth daily. 30 tablet 0  . ENALAPRIL MALEATE 2.5 MG PO TABS Oral Take 2.5 mg by mouth daily.    Marland Kitchen HYDROCODONE-ACETAMINOPHEN 10-325 MG PO TABS Oral Take 1 tablet by mouth 4 (four) times daily.     . INSULIN REGULAR HUMAN 100 UNIT/ML IJ SOLN Subcutaneous Inject 60 Units into the skin 2 (two) times daily.    Marland Kitchen METOPROLOL TARTRATE 50 MG PO TABS Oral Take 1 tablet (50 mg total) by mouth 2 (two) times daily. 60 tablet 0  . PANTOPRAZOLE SODIUM 40 MG PO TBEC Oral Take 1 tablet (40 mg total) by mouth 2  (two) times daily before a meal. 60 tablet 0  . POTASSIUM CHLORIDE 20 MEQ/15ML (10%) PO LIQD Oral Take 20 mEq by mouth 2 (two) times daily.     Marland Kitchen PROMETHAZINE HCL 25 MG PO TABS Oral Take 25 mg by mouth 4 (four) times daily as needed. nausea    . TORSEMIDE 100 MG PO TABS Oral Take 50 mg by mouth 2 (two) times daily.     Marland Kitchen PROMETHAZINE HCL 25 MG PO TABS Oral Take 0.5 tablets (12.5 mg total) by mouth every 6 (six) hours as needed for nausea. 10 tablet 0    BP 159/89  Pulse 84  Temp 98.1 F (36.7 C) (Oral)  Resp 20  SpO2 97%  Physical Exam  Nursing note and vitals reviewed. Constitutional: She is oriented to person, place, and time. She appears well-developed and well-nourished. No distress.       Morbidly obese  HENT:  Head: Normocephalic and atraumatic.  Mouth/Throat: Oropharynx is clear and moist.  Neck: Normal range of motion. Neck supple.  Cardiovascular: Normal rate, regular rhythm and normal heart sounds.   Pulmonary/Chest: Effort normal and breath sounds normal.  Musculoskeletal: She exhibits edema and tenderness.  Lymphadenopathy:    She has no cervical adenopathy.  Neurological: She is alert and oriented to person, place, and time. She exhibits normal muscle tone. Coordination normal.  Skin: Skin is warm and dry. Rash noted. There is erythema.          Mild erythema to the medial aspect of the left thigh.  Distal sensation intact.  DP pulses are equal and brisk.  No obvious open wounds or drainage    ED Course  Procedures (including critical care time)  Labs Reviewed  CBC WITH DIFFERENTIAL - Abnormal; Notable for the following:    RBC 3.80 (*)     Hemoglobin 11.1 (*)     HCT 33.5 (*)     All other components within normal limits  BASIC METABOLIC PANEL - Abnormal; Notable for the following:    Glucose, Bld 252 (*)     GFR calc non Af Amer 63 (*)     GFR calc Af Amer 73 (*)     All other components within normal limits  LACTIC ACID, PLASMA  CULTURE, BLOOD (ROUTINE  X 2)  CULTURE, BLOOD (ROUTINE X 2)   Dg Chest Portable 1 View  06/30/2012  *RADIOLOGY REPORT*  Clinical Data: Shortness of breath  PORTABLE CHEST - 1 VIEW  Comparison: Portable exam 1229 hours compared to 03/11/2012  Findings: Enlargement of cardiac silhouette with pulmonary vascular congestion. Mediastinal contours normal. Underpenetration at lung bases. Question mild right basilar atelectasis. No definite infiltrate, pleural effusion, or pneumothorax. No acute osseous findings.  IMPRESSION: Enlargement of  cardiac silhouette with pulmonary vascular congestion. Question mild right basilar atelectasis.  Original Report Authenticated By: Burnetta Sabin, M.D.     Blood cultures pending   MDM    Previous ED charts and nursing notes reviewed.  Pt has hx of cellulitis with previous admissions.    Patient is resting, NAD.  Vitals remain stable.  Will consult for admit.  Pt also seen by EDP and care plan discussed.     Consulted Dr. Willey Blade and he will admit.    Will start clindamycin, pain medication given.       Maurie Olesen L. [place], [place] 06/30/12 1501

## 2012-06-30 NOTE — Progress Notes (Signed)
Called Dr. Willey Blade and informed him patient is in her room. Dr. Willey Blade stated he will be in to see her.

## 2012-06-30 NOTE — ED Notes (Signed)
Pt reports has lymphedema and left leg is always larger than the right but for the past few days, swelling has gotten worse.  Pt c/o sob with little exertion, and lower back pain.  Pt says heart races with any exertion.

## 2012-06-30 NOTE — ED Provider Notes (Signed)
Medical screening examination/treatment/procedure(s) were conducted as a shared visit with non-physician practitioner(s) and myself.  I personally evaluated the patient during the encounter 46 yo woman with chronic lymphedema has developed cellulitis of her left thigh.  Recommend admission for IV antibiotics.  Mylinda Latina III, MD 06/30/12 2138

## 2012-06-30 NOTE — Progress Notes (Signed)
Received report from [facility] in ED.

## 2012-07-01 LAB — HEMOGLOBIN A1C
Hgb A1c MFr Bld: 11 % — ABNORMAL HIGH (ref <5.7)
Mean Plasma Glucose: 269 mg/dL — ABNORMAL HIGH (ref <117)

## 2012-07-01 LAB — GLUCOSE, CAPILLARY
Glucose-Capillary: 153 mg/dL — ABNORMAL HIGH (ref 70–99)
Glucose-Capillary: 161 mg/dL — ABNORMAL HIGH (ref 70–99)

## 2012-07-01 MED ORDER — SODIUM CHLORIDE 0.9 % IV SOLN
600.0000 mg | Freq: Two times a day (BID) | INTRAVENOUS | Status: DC
  Administered 2012-07-01 – 2012-07-08 (×14): 600 mg via INTRAVENOUS
  Filled 2012-07-01 (×19): qty 600

## 2012-07-01 MED ORDER — SODIUM CHLORIDE 0.9 % IJ SOLN
INTRAMUSCULAR | Status: AC
  Filled 2012-07-01: qty 3

## 2012-07-01 NOTE — Progress Notes (Signed)
Subjective: She says she feels a little bit better. She was admitted yesterday with cellulitis  Objective: Vital signs in last 24 hours: Temp:  [97.8 F (36.6 C)-98.3 F (36.8 C)] 98.3 F (36.8 C) (07/13 0509) Pulse Rate:  [73-98] 73  (07/13 0509) Resp:  [20-26] 20  (07/13 0509) BP: (138-189)/(80-96) 138/94 mmHg (07/13 0509) SpO2:  [90 %-100 %] 98 % (07/13 0509) Weight:  [78.109 kg (172 lb 3.2 oz)-175.8 kg (387 lb 9.1 oz)] 78.109 kg (172 lb 3.2 oz) (07/13 0509) Weight change:  Last BM Date: 06/30/12  Intake/Output from previous day: 07/12 0701 - 07/13 0700 In: 802 [P.O.:480; I.V.:222; IV Piggyback:100] Out: -   PHYSICAL EXAM General appearance: alert, cooperative, mild distress and morbidly obese Resp: clear to auscultation bilaterally Cardio: regular rate and rhythm, S1, S2 normal, no murmur, click, rub or gallop GI: soft, non-tender; bowel sounds normal; no masses,  no organomegaly Extremities: She has marked lymphedema bilaterally. Her left thigh is erythematous and inflamed and tender  Lab Results:    Basic Metabolic Panel:  Basename 06/30/12 1206  NA 136  K 3.6  CL 97  CO2 28  GLUCOSE 252*  BUN 17  CREATININE 1.05  CALCIUM 9.9  MG --  PHOS --   Liver Function Tests: No results found for this basename: AST:2,ALT:2,ALKPHOS:2,BILITOT:2,PROT:2,ALBUMIN:2 in the last 72 hours No results found for this basename: LIPASE:2,AMYLASE:2 in the last 72 hours No results found for this basename: AMMONIA:2 in the last 72 hours CBC:  Basename 06/30/12 1206  WBC 8.4  NEUTROABS 5.8  HGB 11.1*  HCT 33.5*  MCV 88.2  PLT 314   Cardiac Enzymes: No results found for this basename: CKTOTAL:3,CKMB:3,CKMBINDEX:3,TROPONINI:3 in the last 72 hours BNP: No results found for this basename: PROBNP:3 in the last 72 hours D-Dimer: No results found for this basename: DDIMER:2 in the last 72 hours CBG:  Basename 07/01/12 0758 06/30/12 2055 06/30/12 1628  GLUCAP 208* 234* 183*    Hemoglobin A1C:  Basename 06/30/12 1206  HGBA1C 11.0*   Fasting Lipid Panel: No results found for this basename: CHOL,HDL,LDLCALC,TRIG,CHOLHDL,LDLDIRECT in the last 72 hours Thyroid Function Tests: No results found for this basename: TSH,T4TOTAL,FREET4,T3FREE,THYROIDAB in the last 72 hours Anemia Panel: No results found for this basename: VITAMINB12,FOLATE,FERRITIN,TIBC,IRON,RETICCTPCT in the last 72 hours Coagulation: No results found for this basename: LABPROT:2,INR:2 in the last 72 hours Urine Drug Screen: Drugs of Abuse  No results found for this basename: labopia, cocainscrnur, labbenz, amphetmu, thcu, labbarb    Alcohol Level: No results found for this basename: ETH:2 in the last 72 hours Urinalysis: No results found for this basename: COLORURINE:2,APPERANCEUR:2,LABSPEC:2,PHURINE:2,GLUCOSEU:2,HGBUR:2,BILIRUBINUR:2,KETONESUR:2,PROTEINUR:2,UROBILINOGEN:2,NITRITE:2,LEUKOCYTESUR:2 in the last 72 hours Misc. Labs:  ABGS No results found for this basename: PHART,PCO2,PO2ART,TCO2,HCO3 in the last 72 hours CULTURES Recent Results (from the past 240 hour(s))  CULTURE, BLOOD (ROUTINE X 2)     Status: Normal (Preliminary result)   Collection Time   06/30/12 12:08 PM      Component Value Range Status Comment   Specimen Description BLOOD LEFT ARM   Final    Special Requests BOTTLES DRAWN AEROBIC AND ANAEROBIC  8  CC  EACH   Final    Culture NO GROWTH 1 DAY   Final    Report Status PENDING   Incomplete   CULTURE, BLOOD (ROUTINE X 2)     Status: Normal (Preliminary result)   Collection Time   06/30/12 12:15 PM      Component Value Range Status Comment   Specimen Description BLOOD  RIGHT HAND   Final    Special Requests BOTTLES DRAWN AEROBIC ONLY  6 CC   Final    Culture NO GROWTH 1 DAY   Final    Report Status PENDING   Incomplete    Studies/Results: Dg Chest Portable 1 View  06/30/2012  *RADIOLOGY REPORT*  Clinical Data: Shortness of breath  PORTABLE CHEST - 1 VIEW  Comparison:  Portable exam 1229 hours compared to 03/11/2012  Findings: Enlargement of cardiac silhouette with pulmonary vascular congestion. Mediastinal contours normal. Underpenetration at lung bases. Question mild right basilar atelectasis. No definite infiltrate, pleural effusion, or pneumothorax. No acute osseous findings.  IMPRESSION: Enlargement of cardiac silhouette with pulmonary vascular congestion. Question mild right basilar atelectasis.  Original Report Authenticated By: Burnetta Sabin, M.D.    Medications:  Prior to Admission:  Prescriptions prior to admission  Medication Sig Dispense Refill  . acetaminophen (TYLENOL) 325 MG tablet Take 650 mg by mouth every 6 (six) hours as needed. fever      . amLODipine (NORVASC) 10 MG tablet Take 1 tablet (10 mg total) by mouth daily.  30 tablet  0  . enalapril (VASOTEC) 2.5 MG tablet Take 2.5 mg by mouth daily.      Marland Kitchen HYDROcodone-acetaminophen (NORCO) 10-325 MG per tablet Take 1 tablet by mouth 4 (four) times daily.       . insulin regular (NOVOLIN R,HUMULIN R) 100 units/mL injection Inject 60 Units into the skin 2 (two) times daily.      . metoprolol (LOPRESSOR) 50 MG tablet Take 1 tablet (50 mg total) by mouth 2 (two) times daily.  60 tablet  0  . pantoprazole (PROTONIX) 40 MG tablet Take 1 tablet (40 mg total) by mouth 2 (two) times daily before a meal.  60 tablet  0  . potassium chloride 20 MEQ/15ML (10%) solution Take 20 mEq by mouth 2 (two) times daily.       . promethazine (PHENERGAN) 25 MG tablet Take 25 mg by mouth 4 (four) times daily as needed. nausea      . torsemide (DEMADEX) 100 MG tablet Take 50 mg by mouth 2 (two) times daily.       . promethazine (PHENERGAN) 25 MG tablet Take 0.5 tablets (12.5 mg total) by mouth every 6 (six) hours as needed for nausea.  10 tablet  0   Scheduled:   . amLODipine  10 mg Oral Daily  . ceFTAROline (TEFLARO) IV  600 mg Intravenous Q12H  . clindamycin      . clindamycin  900 mg Intravenous Once  . enalapril   2.5 mg Oral Daily  . enoxaparin (LOVENOX) injection  40 mg Subcutaneous Q24H  . HYDROmorphone  1 mg Intravenous Once  . insulin aspart  0-20 Units Subcutaneous TID WC  . insulin aspart  0-5 Units Subcutaneous QHS  . insulin glargine  40 Units Subcutaneous BID  . metoprolol  50 mg Oral BID  . ondansetron  4 mg Intravenous Once  . pantoprazole  40 mg Oral BID AC  . potassium chloride  20 mEq Oral BID  . sodium chloride      . torsemide  50 mg Oral BID  . DISCONTD: clindamycin (CLEOCIN) 900 mg IVPB (ADD-Vant)  900 mg Intravenous Once  . DISCONTD: clindamycin  900 mg Intravenous Once  . DISCONTD: clindamycin (CLEOCIN) IV  900 mg Intravenous Q8H   Continuous:   . sodium chloride 20 mL/hr at 06/30/12 1910   DXI:[id number], acetaminophen, alum & mag hydroxide-simeth,  HYDROcodone-acetaminophen, HYDROmorphone (DILAUDID) injection, ondansetron (ZOFRAN) IV, ondansetron, promethazine  Assesment: She has cellulitis of the thigh. She has chronic lymphedema. She is diabetic. She has morbid obesity. Active Problems:  * No active hospital problems. *     Plan: I'm going to switch her to teflaro from her current clindamycin    LOS: 1 day   Traquan Duarte L 07/01/2012, 9:10 AM

## 2012-07-02 LAB — GLUCOSE, CAPILLARY
Glucose-Capillary: 189 mg/dL — ABNORMAL HIGH (ref 70–99)
Glucose-Capillary: 187 mg/dL — ABNORMAL HIGH (ref 70–99)
Glucose-Capillary: 130 mg/dL — ABNORMAL HIGH (ref 70–99)
Glucose-Capillary: 133 mg/dL — ABNORMAL HIGH (ref 70–99)

## 2012-07-02 NOTE — Progress Notes (Signed)
Wet heating pad removed. Patient denies any needs at this time. Call light in reach. Family at bedside. Will continue to monitor.

## 2012-07-02 NOTE — Progress Notes (Signed)
Wet heating pad applied to patient's left thigh.

## 2012-07-02 NOTE — Progress Notes (Signed)
Heating pad removed. Will reapply in four hours.

## 2012-07-02 NOTE — Progress Notes (Signed)
Wet head pad applied to patient's thigh.

## 2012-07-02 NOTE — Progress Notes (Signed)
NAMERUDY, DOMEK              ACCOUNT NO.:  [id number]  MEDICAL RECORD NO.:  [id number]  LOCATION:  A307                          FACILITY:  [facility]  PHYSICIAN:  Ree Alcalde L. [name], M.D.DATE OF BIRTH:  September 12, 1966  DATE OF PROCEDURE: DATE OF DISCHARGE:                                PROGRESS NOTE   [name] was admitted with cellulitis of the thigh.  She says she is doing fairly well this morning, but is having more pain.  She has no other new complaints.  Her breathing is okay.  PHYSICAL EXAMINATION:  GENERAL:  Shows her temperature is 98.9, pulse 73, respirations 20, blood pressure 126/77, O2 sats 93%.  Her temperature was as high as 100.5. CHEST:  Clear. HEART:  Regular. ABDOMEN:  Soft. EXTREMITIES:  Showed no edema. CENTRAL NERVOUS SYSTEM:  Grossly intact. EXTREMITIES:  Her thigh is still swollen and tender.  ASSESSMENT:  She has cellulitis.  She has diabetes.  She has morbid obesity.  She has chronic lymphedema.  My plan is for her to continue on her current treatments and medications.  No changes.     Alyla Pietila L. [name], M.D.     ELH/MEDQ  D:  07/02/2012  T:  07/02/2012  Job:  [id number]

## 2012-07-03 LAB — GLUCOSE, CAPILLARY
Glucose-Capillary: 155 mg/dL — ABNORMAL HIGH (ref 70–99)
Glucose-Capillary: 113 mg/dL — ABNORMAL HIGH (ref 70–99)

## 2012-07-03 LAB — CBC WITH DIFFERENTIAL/PLATELET
Basophils Absolute: 0 10*3/uL (ref 0.0–0.1)
Basophils Relative: 1 % (ref 0–1)
Lymphocytes Relative: 15 % (ref 12–46)
MCHC: 31.8 g/dL (ref 30.0–36.0)
Neutro Abs: 4.4 10*3/uL (ref 1.7–7.7)
Platelets: 273 10*3/uL (ref 150–400)
RDW: 15.2 % (ref 11.5–15.5)
WBC: 6.2 10*3/uL (ref 4.0–10.5)

## 2012-07-03 LAB — BASIC METABOLIC PANEL
BUN: 29 mg/dL — ABNORMAL HIGH (ref 6–23)
Calcium: 9.5 mg/dL (ref 8.4–10.5)
Creatinine, Ser: 1.94 mg/dL — ABNORMAL HIGH (ref 0.50–1.10)
GFR calc Af Amer: 35 mL/min — ABNORMAL LOW (ref >90)
GFR calc non Af Amer: 30 mL/min — ABNORMAL LOW (ref >90)

## 2012-07-03 MED ORDER — ONDANSETRON 8 MG/NS 50 ML IVPB
8.0000 mg | Freq: Four times a day (QID) | INTRAVENOUS | Status: DC | PRN
  Administered 2012-07-05: 8 mg via INTRAVENOUS
  Filled 2012-07-03: qty 8

## 2012-07-03 MED ORDER — MECLIZINE HCL 12.5 MG PO TABS
25.0000 mg | ORAL_TABLET | Freq: Three times a day (TID) | ORAL | Status: DC
  Administered 2012-07-03 – 2012-07-08 (×15): 25 mg via ORAL
  Filled 2012-07-03: qty 1
  Filled 2012-07-03 (×6): qty 2
  Filled 2012-07-03: qty 1
  Filled 2012-07-03: qty 2
  Filled 2012-07-03: qty 1
  Filled 2012-07-03 (×4): qty 2
  Filled 2012-07-03: qty 1
  Filled 2012-07-03 (×3): qty 2

## 2012-07-03 MED ORDER — ONDANSETRON HCL 4 MG PO TABS
8.0000 mg | ORAL_TABLET | Freq: Four times a day (QID) | ORAL | Status: DC | PRN
  Administered 2012-07-04 – 2012-07-06 (×3): 8 mg via ORAL
  Filled 2012-07-03 (×3): qty 2

## 2012-07-03 NOTE — Care Management Note (Unsigned)
    Page 1 of 2   07/07/2012     11:57:39 AM   CARE MANAGEMENT NOTE 07/07/2012  Patient:  [name], [name]   Account Number:  [id number]  Date Initiated:  07/03/2012  Documentation initiated by:  Theophilus Kinds  Subjective/Objective Assessment:   Pt admitted from home with recurrent cellulitis. Pt lives with handicap husband and will return home at discharge. Pt has used [facility] in the past for IV Ab therapy.     Action/Plan:   Pt would like to used [facility] at discharge for more IV AB therapy. Pt will have a port a cath placed this admission for IV therapy. No DME needs noted at this time.   Anticipated DC Date:  07/06/2012   Anticipated DC Plan:  [place]  CM consult      [facility] Choice  HOME HEALTH   Choice offered to / List presented to:  C-1 Patient        [place] arranged  HH-1 RN  IV Antibiotics      [place].   Status of service:  In process, will continue to follow Medicare Important Message given?   (If response is "NO", the following Medicare IM given date fields will be blank) Date Medicare IM given:   Date Additional Medicare IM given:    Discharge Disposition:    Per UR Regulation:    If discussed at Long Length of Stay Meetings, dates discussed:    Comments:  07/07/12 [place], RN BSN Cm Pt potential for discharge on 07/08/12 with IV AB with [facility]. CM spoke with MD about [place] orders and how to order IV AB. Instructions will be left with weekend staff on what will need to be done for pts discharge. Pt is aware of discharge arrangements. [facility] is aware of referral.  07/03/12 [place], RN BSN CM

## 2012-07-03 NOTE — H&P (Signed)
[name], ASLESON              ACCOUNT NO.:  [id number]  MEDICAL RECORD NO.:  [id number]  LOCATION:                                 FACILITY:  PHYSICIAN:  Paula Compton. Willey Blade, MD       DATE OF BIRTH:  [birth year]  DATE OF ADMISSION:  06/30/2012 DATE OF DISCHARGE:  LH                             HISTORY & PHYSICAL   CHIEF COMPLAINT:  Left leg pain.  HISTORY OF PRESENT ILLNESS:  This patient is a 46 year old African American female who presented to the emergency room with increasing pain in the left thigh over the past 3 days.  She had developed a fever with redness and increased swelling in the left thigh.  She has a history of chronic lymphedema.  She has had similar previous episodes.  The last being in March.  She had an episode in November 2012 as well.  She was evaluated in the emergency room and treated for cellulitis with clindamycin.  PAST MEDICAL HISTORY: 1. Chronic lymph edema. 2. Diabetes. 3. Status post Chiari malformation decompression. 4. Coronary artery disease. 5. Hyperlipidemia. 6. Hypertension. 7. Asthma. 8. Hysterectomy. 9. Tonsillectomy. 10.Adenoidectomy. 11.Partial thyroidectomy.  MEDICATIONS:  Amlodipine 10 mg daily, Vasotec 2.5 mg daily, hydrocodone 10/325 q.i.d. p.r.n., Novolin 70/30 60 units twice a day, metoprolol 50 mg b.i.d., pantoprazole 40 mg b.i.d., potassium 20 mEq b.i.d., promethazine 25 mg q.4 p.r.n., torsemide 50 mg b.i.d.  ALLERGIES:  VANCOMYCIN CONTRAST DYE, PNEUMOCOCCAL VACCINE.  SOCIAL HISTORY:  She does not smoke, drink, or use recreational drugs.  FAMILY HISTORY:  Colon cancer, stroke and heart disease in her mother.  REVIEW OF SYSTEMS:  She denies any leg ulcers.  She has had no chest pain, increased difficulty breathing, vomiting, change in bowel habits or difficulty voiding.  PHYSICAL EXAMINATION:  VITAL SIGNS:  Temperature 98.1, pulse 84, blood pressure 159/89, respirations 20. GENERAL:  Morbidly obese, alert in no acute  distress. HEENT: Eyes, nose, and oropharynx are unremarkable. NECK:  Supple with no JVD or lymphadenopathy. LUNGS:  Clear. HEART:  Regular with no murmurs. ABDOMEN:  Soft and nontender with no hepatosplenomegaly. EXTREMITIES:  Massive edema in the legs.  There is tenderness and redness in the left medial thigh.  Pulses are not palpable in the feet due to her edema. NEURO:  No focal weakness. LYMPH NODES:  No cervical or supraclavicular enlargement.  LABORATORY DATA:  White count 8.4, hemoglobin 11.1, platelets 314,000. Sodium 136, potassium 3.6, bicarb 28, BUN 17, creatinine 1.05, glucose 252, lactic acid 1.5.  The chest x-ray reveals no acute infiltrate.  IMPRESSION/PLAN: 1. Left thigh cellulitis with massive chronic lymphedema.  She is     allergic to vancomycin.  She has a normal white count and is     currently afebrile.  She will be treated initially with     clindamycin.  She will be evaluated further by her personal     physician Dr. [name] on July 13. 2. Diabetes.  We will modify her 70/30 Novolin regimen to Lantus twice     a day with sliding scale NovoLog. 3. Hypertension.  Continue current combination therapy. 4. History of asthma. 5. History of Arnold-Chiari malformation.  Paula Compton. Willey Blade, MD     ROF/MEDQ  D:  07/01/2012  T:  07/01/2012  Job:  [id number]

## 2012-07-03 NOTE — Progress Notes (Signed)
UR chart review completed.

## 2012-07-03 NOTE — Consult Note (Signed)
Reason for Consult:Access, chronic thrombophlebitis Referring Physician: [name] is an 46 y.o. female.  HPI: Patient presented to [facility] with cellulitis involving the Left thigh.  She has had 2 similar episodes in the last 6 months.  Both apparently responded to antibiotic therapy.  She has not been seen by infectious disease.  She has had two prior PICC lines for home abx treatment.  Patient states that it is difficult to obtain IV access.    Past Medical History  Diagnosis Date  . Diabetes mellitus   . Chronic pain   . Lymph edema   . Cellulitis   . Chiari malformation     s/p surgery  . S/P colonoscopy 05/26/2007    Dr. Laural [name] sigmoid diverticulosis random biopsies benign  . CAD (coronary artery disease)   . Hypercholesterolemia   . Hypertension   . Asthma   . S/P endoscopy 05/01/2009    Dr. Penelope [name] pill-induced esophageal ulcerations distal to midesophagus, 2 small ulcers in the antrum of the stomach    Past Surgical History  Procedure Date  . Cesarean section      x 2  . Abdominal hysterectomy   . Tonsillectomy   . Adenoidectomy   . Thyroidectomy, partial   . Craniectomy suboccipital w/ cervical laminectomy / chiari     Family History  Problem Relation Age of Onset  . Colon cancer Mother 38  . Stroke Mother 60  . Coronary artery disease Mother     Social History:  reports that she has never smoked. She has never used smokeless tobacco. She reports that she does not drink alcohol or use illicit drugs.  Allergies:  Allergies  Allergen Reactions  . Contrast Media (Iodinated Diagnostic Agents) Hives and Swelling    Dye for cardiac cath. Tongue swells  . Pineapple Itching and Swelling  . Pneumococcal Vaccines Swelling    Turns skin black, and swelling  . Shellfish Allergy Itching and Swelling  . Vancomycin     Medications:  I have reviewed the patient's current medications. Prior to Admission:  Prescriptions prior to admission  Medication  Sig Dispense Refill  . acetaminophen (TYLENOL) 325 MG tablet Take 650 mg by mouth every 6 (six) hours as needed. fever      . amLODipine (NORVASC) 10 MG tablet Take 1 tablet (10 mg total) by mouth daily.  30 tablet  0  . enalapril (VASOTEC) 2.5 MG tablet Take 2.5 mg by mouth daily.      Marland Kitchen HYDROcodone-acetaminophen (NORCO) 10-325 MG per tablet Take 1 tablet by mouth 4 (four) times daily.       . insulin regular (NOVOLIN R,HUMULIN R) 100 units/mL injection Inject 60 Units into the skin 2 (two) times daily.      . metoprolol (LOPRESSOR) 50 MG tablet Take 1 tablet (50 mg total) by mouth 2 (two) times daily.  60 tablet  0  . pantoprazole (PROTONIX) 40 MG tablet Take 1 tablet (40 mg total) by mouth 2 (two) times daily before a meal.  60 tablet  0  . potassium chloride 20 MEQ/15ML (10%) solution Take 20 mEq by mouth 2 (two) times daily.       . promethazine (PHENERGAN) 25 MG tablet Take 25 mg by mouth 4 (four) times daily as needed. nausea      . torsemide (DEMADEX) 100 MG tablet Take 50 mg by mouth 2 (two) times daily.       . promethazine (PHENERGAN) 25 MG tablet Take 0.5  tablets (12.5 mg total) by mouth every 6 (six) hours as needed for nausea.  10 tablet  0   Scheduled:   . amLODipine  10 mg Oral Daily  . ceFTAROline (TEFLARO) IV  600 mg Intravenous Q12H  . enalapril  2.5 mg Oral Daily  . enoxaparin (LOVENOX) injection  40 mg Subcutaneous Q24H  . insulin aspart  0-20 Units Subcutaneous TID WC  . insulin aspart  0-5 Units Subcutaneous QHS  . insulin glargine  40 Units Subcutaneous BID  . meclizine  25 mg Oral TID  . metoprolol  50 mg Oral BID  . pantoprazole  40 mg Oral BID AC  . potassium chloride  20 mEq Oral BID  . torsemide  50 mg Oral BID   Continuous:   . sodium chloride 20 mL/hr at 07/03/12 2016   WGN:[id number], acetaminophen, alum & mag hydroxide-simeth, HYDROcodone-acetaminophen, HYDROmorphone (DILAUDID) injection, ondansetron (ZOFRAN) IV, ondansetron, promethazine, DISCONTD:  ondansetron (ZOFRAN) IV, DISCONTD: ondansetron  Results for orders placed during the hospital encounter of 06/30/12 (from the past 48 hour(s))  GLUCOSE, CAPILLARY     Status: Abnormal   Collection Time   07/02/12  7:40 AM      Component Value Range Comment   Glucose-Capillary 189 (*) 70 - 99 mg/dL   GLUCOSE, CAPILLARY     Status: Abnormal   Collection Time   07/02/12 11:35 AM      Component Value Range Comment   Glucose-Capillary 187 (*) 70 - 99 mg/dL   GLUCOSE, CAPILLARY     Status: Abnormal   Collection Time   07/02/12  4:29 PM      Component Value Range Comment   Glucose-Capillary 130 (*) 70 - 99 mg/dL   GLUCOSE, CAPILLARY     Status: Abnormal   Collection Time   07/02/12  8:39 PM      Component Value Range Comment   Glucose-Capillary 133 (*) 70 - 99 mg/dL   BASIC METABOLIC PANEL     Status: Abnormal   Collection Time   07/03/12  4:54 AM      Component Value Range Comment   Sodium 138  135 - 145 mEq/L    Potassium 4.9  3.5 - 5.1 mEq/L    Chloride 99  96 - 112 mEq/L    CO2 30  19 - 32 mEq/L    Glucose, Bld 148 (*) 70 - 99 mg/dL    BUN 29 (*) 6 - 23 mg/dL    Creatinine, Ser 1.94 (*) 0.50 - 1.10 mg/dL    Calcium 9.5  8.4 - 10.5 mg/dL    GFR calc non Af Amer 30 (*) >90 mL/min    GFR calc Af Amer 35 (*) >90 mL/min   CBC WITH DIFFERENTIAL     Status: Abnormal   Collection Time   07/03/12  4:54 AM      Component Value Range Comment   WBC 6.2  4.0 - 10.5 K/uL    RBC 3.74 (*) 3.87 - 5.11 MIL/uL    Hemoglobin 10.7 (*) 12.0 - 15.0 g/dL    HCT 33.6 (*) 36.0 - 46.0 %    MCV 89.8  78.0 - 100.0 fL    MCH 28.6  26.0 - 34.0 pg    MCHC 31.8  30.0 - 36.0 g/dL    RDW 15.2  11.5 - 15.5 %    Platelets 273  150 - 400 K/uL    Neutrophils Relative 70  43 - 77 %    Neutro  Abs 4.4  1.7 - 7.7 K/uL    Lymphocytes Relative 15  12 - 46 %    Lymphs Abs 0.9  0.7 - 4.0 K/uL    Monocytes Relative 10  3 - 12 %    Monocytes Absolute 0.6  0.1 - 1.0 K/uL    Eosinophils Relative 5  0 - 5 %     Eosinophils Absolute 0.3  0.0 - 0.7 K/uL    Basophils Relative 1  0 - 1 %    Basophils Absolute 0.0  0.0 - 0.1 K/uL   GLUCOSE, CAPILLARY     Status: Abnormal   Collection Time   07/03/12  7:25 AM      Component Value Range Comment   Glucose-Capillary 149 (*) 70 - 99 mg/dL    Comment 1 Notify RN     GLUCOSE, CAPILLARY     Status: Abnormal   Collection Time   07/03/12 11:12 AM      Component Value Range Comment   Glucose-Capillary 155 (*) 70 - 99 mg/dL    Comment 1 Notify RN     GLUCOSE, CAPILLARY     Status: Abnormal   Collection Time   07/03/12  4:48 PM      Component Value Range Comment   Glucose-Capillary 104 (*) 70 - 99 mg/dL    Comment 1 Notify RN      Comment 2 Documented in Chart     GLUCOSE, CAPILLARY     Status: Abnormal   Collection Time   07/03/12  8:40 PM      Component Value Range Comment   Glucose-Capillary 113 (*) 70 - 99 mg/dL    Comment 1 Notify RN      Comment 2 Documented in Chart       No results found.  Review of Systems  Constitutional: Negative.   HENT: Negative.   Eyes: Negative.   Respiratory: Positive for shortness of breath.   Cardiovascular: Negative.   Gastrointestinal: Positive for heartburn.  Genitourinary: Negative.   Musculoskeletal: Positive for joint pain.  Skin: Negative.   Neurological: Negative.   Endo/Heme/Allergies: Negative.   Psychiatric/Behavioral: Negative.    Blood pressure 156/78, pulse 79, temperature 98.8 F (37.1 C), temperature source Oral, resp. rate 20, height 5' 5" (1.651 m), weight 176.6 kg (389 lb 5.3 oz), SpO2 96.00%. Physical Exam  Constitutional: She appears well-developed and well-nourished.       Morbidly obese,  Severe lymphedema bilateral lower extremities.    HENT:  Head: Normocephalic and atraumatic.  Eyes: Conjunctivae and EOM are normal. Pupils are equal, round, and reactive to light. No scleral icterus.  Neck: Normal range of motion. Neck supple. No tracheal deviation present. No thyromegaly present.    Cardiovascular: Normal rate and regular rhythm.   Respiratory: Effort normal and breath sounds normal.  GI: Soft.       obese  Lymphadenopathy:    She has no cervical adenopathy.  Skin: Skin is warm. There is erythema.    Assessment/Plan: Chronic thrombophlebitis.  Unfortunately, patient is not a candidate for a PICC line due to being on their "do not attempt" list.  Currently patient is a poor candidate for a port as with an active infection, there is a higher risk of port seeding.  This was discussed with the patient.  If she wishes to proceed, she can be placed on the Wednesday schedule.    [name] C 07/03/2012, 9:24 PM

## 2012-07-03 NOTE — Progress Notes (Signed)
[name], [name]              ACCOUNT NO.:  [id number]  MEDICAL RECORD NO.:  [id number]  LOCATION:  A307                          FACILITY:  [facility]  PHYSICIAN:  Jocilyn Trego L. [name], M.D.DATE OF BIRTH:  August 30, 1966  DATE OF PROCEDURE: DATE OF DISCHARGE:                                PROGRESS NOTE   [name] was admitted with cellulitis of the leg.  This is in an area of chronic lymphedema.  She says she is having some nausea, but otherwise doing okay.  PHYSICAL EXAMINATION:  GENERAL:  Shows that she is awake and alert.  Her temperature is 98.8, pulse 90, respirations 18, blood pressure 122/75. CHEST:  Clear. HEART:  Regular. ABDOMEN:  Soft. EXTREMITIES:  Showed no edema.  Her leg looks better.  My plan then is to continue with her current treatments, increase her medications for nausea, and follow.     [name] L. [name], M.D.     ELH/MEDQ  D:  07/03/2012  T:  07/03/2012  Job:  [id number]

## 2012-07-04 LAB — GLUCOSE, CAPILLARY
Glucose-Capillary: 95 mg/dL (ref 70–99)
Glucose-Capillary: 114 mg/dL — ABNORMAL HIGH (ref 70–99)

## 2012-07-04 LAB — BASIC METABOLIC PANEL
BUN: 28 mg/dL — ABNORMAL HIGH (ref 6–23)
Creatinine, Ser: 1.75 mg/dL — ABNORMAL HIGH (ref 0.50–1.10)
GFR calc non Af Amer: 34 mL/min — ABNORMAL LOW (ref >90)
Glucose, Bld: 153 mg/dL — ABNORMAL HIGH (ref 70–99)
Potassium: 4.2 mEq/L (ref 3.5–5.1)

## 2012-07-04 LAB — CBC WITH DIFFERENTIAL/PLATELET
Basophils Relative: 0 % (ref 0–1)
Eosinophils Absolute: 0.4 10*3/uL (ref 0.0–0.7)
Eosinophils Relative: 4 % (ref 0–5)
HCT: 32.5 % — ABNORMAL LOW (ref 36.0–46.0)
Hemoglobin: 10.6 g/dL — ABNORMAL LOW (ref 12.0–15.0)
Lymphs Abs: 1 10*3/uL (ref 0.7–4.0)
MCH: 28.9 pg (ref 26.0–34.0)
MCHC: 32.6 g/dL (ref 30.0–36.0)
MCV: 88.6 fL (ref 78.0–100.0)
Monocytes Absolute: 0.8 10*3/uL (ref 0.1–1.0)
Monocytes Relative: 9 % (ref 3–12)
Neutrophils Relative %: 77 % (ref 43–77)
RBC: 3.67 MIL/uL — ABNORMAL LOW (ref 3.87–5.11)

## 2012-07-04 LAB — SURGICAL PCR SCREEN: MRSA, PCR: NEGATIVE

## 2012-07-04 MED ORDER — BIOTENE DRY MOUTH MT LIQD
15.0000 mL | Freq: Two times a day (BID) | OROMUCOSAL | Status: DC
  Administered 2012-07-04 – 2012-07-08 (×9): 15 mL via OROMUCOSAL

## 2012-07-04 MED ORDER — CHLORHEXIDINE GLUCONATE 4 % EX LIQD: 1.0000 "application " | Freq: Once | CUTANEOUS | Status: DC

## 2012-07-04 NOTE — Progress Notes (Signed)
07/04/12 1120 Patient stated she was to not eat anything this morning until confirmed with Dr Geroge Baseman whether port placement would be today or tomorrow per Dr [name] this am. Discussed with Dr Geroge Baseman, stated ok for her to eat, port placement to be scheduled for tomorrow. Cindie Crumbly

## 2012-07-04 NOTE — Progress Notes (Signed)
CBG: 65  Treatment: 15 GM carbohydrate snack  Symptoms: None  Follow-up CBG: Time:1135 CBG Result:95  Possible Reasons for Event: Inadequate meal intake  Comments/MD notified:Patient did not eat breakfast this am, awaiting confirmation of port placement to be done, pt thought was today, will be scheduled for tomorrow per Dr Geroge Baseman. Notified Dr [name] of CBG results. Orders received to hold Lantus dose for this morning and also hold tomorrow morning Lantus as well. Will notify pharmacy to hold dose.     Cindie Crumbly

## 2012-07-04 NOTE — Progress Notes (Signed)
Subjective: She says she feels better. She has less pain in her leg. The note from Dr. Geroge Baseman is noted and appreciated  Objective: Vital signs in last 24 hours: Temp:  [98.4 F (36.9 C)-98.8 F (37.1 C)] 98.4 F (36.9 C) (07/16 0534) Pulse Rate:  [78-90] 78  (07/16 0534) Resp:  [19-20] 20  (07/16 0534) BP: (122-156)/(75-85) 153/85 mmHg (07/16 0534) SpO2:  [90 %-96 %] 90 % (07/16 0534) Weight:  [175.1 kg (386 lb 0.4 oz)] 175.1 kg (386 lb 0.4 oz) (07/16 0534) Weight change: -1.5 kg (-3 lb 4.9 oz) Last BM Date: 07/02/12  Intake/Output from previous day: 07/15 0701 - 07/16 0700 In: 693 [P.O.:240; I.V.:453] Out: 1000 [Urine:1000]  PHYSICAL EXAM General appearance: alert, cooperative, mild distress and morbidly obese Resp: clear to auscultation bilaterally Cardio: regular rate and rhythm, S1, S2 normal, no murmur, click, rub or gallop GI: soft, non-tender; bowel sounds normal; no masses,  no organomegaly Extremities: Chronic lymphedema. Her left thigh is still firm slightly warm and tender  Lab Results:    Basic Metabolic Panel:  Basename 07/03/12 0454  NA 138  K 4.9  CL 99  CO2 30  GLUCOSE 148*  BUN 29*  CREATININE 1.94*  CALCIUM 9.5  MG --  PHOS --   Liver Function Tests: No results found for this basename: AST:2,ALT:2,ALKPHOS:2,BILITOT:2,PROT:2,ALBUMIN:2 in the last 72 hours No results found for this basename: LIPASE:2,AMYLASE:2 in the last 72 hours No results found for this basename: AMMONIA:2 in the last 72 hours CBC:  Basename 07/03/12 0454  WBC 6.2  NEUTROABS 4.4  HGB 10.7*  HCT 33.6*  MCV 89.8  PLT 273   Cardiac Enzymes: No results found for this basename: CKTOTAL:3,CKMB:3,CKMBINDEX:3,TROPONINI:3 in the last 72 hours BNP: No results found for this basename: PROBNP:3 in the last 72 hours D-Dimer: No results found for this basename: DDIMER:2 in the last 72 hours CBG:  Basename 07/04/12 0712 07/03/12 2040 07/03/12 1648 07/03/12 1112 07/03/12 0725  07/02/12 2039  GLUCAP 72 113* 104* 155* 149* 133*   Hemoglobin A1C: No results found for this basename: HGBA1C in the last 72 hours Fasting Lipid Panel: No results found for this basename: CHOL,HDL,LDLCALC,TRIG,CHOLHDL,LDLDIRECT in the last 72 hours Thyroid Function Tests: No results found for this basename: TSH,T4TOTAL,FREET4,T3FREE,THYROIDAB in the last 72 hours Anemia Panel: No results found for this basename: VITAMINB12,FOLATE,FERRITIN,TIBC,IRON,RETICCTPCT in the last 72 hours Coagulation: No results found for this basename: LABPROT:2,INR:2 in the last 72 hours Urine Drug Screen: Drugs of Abuse  No results found for this basename: labopia, cocainscrnur, labbenz, amphetmu, thcu, labbarb    Alcohol Level: No results found for this basename: ETH:2 in the last 72 hours Urinalysis: No results found for this basename: COLORURINE:2,APPERANCEUR:2,LABSPEC:2,PHURINE:2,GLUCOSEU:2,HGBUR:2,BILIRUBINUR:2,KETONESUR:2,PROTEINUR:2,UROBILINOGEN:2,NITRITE:2,LEUKOCYTESUR:2 in the last 72 hours Misc. Labs:  ABGS No results found for this basename: PHART,PCO2,PO2ART,TCO2,HCO3 in the last 72 hours CULTURES Recent Results (from the past 240 hour(s))  CULTURE, BLOOD (ROUTINE X 2)     Status: Normal (Preliminary result)   Collection Time   06/30/12 12:08 PM      Component Value Range Status Comment   Specimen Description BLOOD LEFT ARM   Final    Special Requests BOTTLES DRAWN AEROBIC AND ANAEROBIC  8  CC  EACH   Final    Culture NO GROWTH 3 DAYS   Final    Report Status PENDING   Incomplete   CULTURE, BLOOD (ROUTINE X 2)     Status: Normal (Preliminary result)   Collection Time   06/30/12 12:15 PM  Component Value Range Status Comment   Specimen Description BLOOD RIGHT HAND   Final    Special Requests BOTTLES DRAWN AEROBIC ONLY  6 CC   Final    Culture NO GROWTH 3 DAYS   Final    Report Status PENDING   Incomplete    Studies/Results: No results found.  Medications:  Prior to Admission:    Prescriptions prior to admission  Medication Sig Dispense Refill  . acetaminophen (TYLENOL) 325 MG tablet Take 650 mg by mouth every 6 (six) hours as needed. fever      . amLODipine (NORVASC) 10 MG tablet Take 1 tablet (10 mg total) by mouth daily.  30 tablet  0  . enalapril (VASOTEC) 2.5 MG tablet Take 2.5 mg by mouth daily.      Marland Kitchen HYDROcodone-acetaminophen (NORCO) 10-325 MG per tablet Take 1 tablet by mouth 4 (four) times daily.       . insulin regular (NOVOLIN R,HUMULIN R) 100 units/mL injection Inject 60 Units into the skin 2 (two) times daily.      . metoprolol (LOPRESSOR) 50 MG tablet Take 1 tablet (50 mg total) by mouth 2 (two) times daily.  60 tablet  0  . pantoprazole (PROTONIX) 40 MG tablet Take 1 tablet (40 mg total) by mouth 2 (two) times daily before a meal.  60 tablet  0  . potassium chloride 20 MEQ/15ML (10%) solution Take 20 mEq by mouth 2 (two) times daily.       . promethazine (PHENERGAN) 25 MG tablet Take 25 mg by mouth 4 (four) times daily as needed. nausea      . torsemide (DEMADEX) 100 MG tablet Take 50 mg by mouth 2 (two) times daily.       . promethazine (PHENERGAN) 25 MG tablet Take 0.5 tablets (12.5 mg total) by mouth every 6 (six) hours as needed for nausea.  10 tablet  0   Scheduled:   . amLODipine  10 mg Oral Daily  . ceFTAROline (TEFLARO) IV  600 mg Intravenous Q12H  . enalapril  2.5 mg Oral Daily  . enoxaparin (LOVENOX) injection  40 mg Subcutaneous Q24H  . insulin aspart  0-20 Units Subcutaneous TID WC  . insulin aspart  0-5 Units Subcutaneous QHS  . insulin glargine  40 Units Subcutaneous BID  . meclizine  25 mg Oral TID  . metoprolol  50 mg Oral BID  . pantoprazole  40 mg Oral BID AC  . potassium chloride  20 mEq Oral BID  . torsemide  50 mg Oral BID   Continuous:   . sodium chloride 20 mL/hr at 07/03/12 2016   VHS:[id number], acetaminophen, alum & mag hydroxide-simeth, HYDROcodone-acetaminophen, HYDROmorphone (DILAUDID) injection, ondansetron  (ZOFRAN) IV, ondansetron, promethazine  Assesment: She has cellulitis of the leg. She has chronic lymphedema. She has diabetes which is stable now. She has morbid obesity. She has had problems with IV access Principal Problem:  *Cellulitis of left leg Active Problems:  Lymphedema of lower extremity  Diabetes mellitus  Morbid obesity  HTN (hypertension)    Plan: I will discuss with Dr. Geroge Baseman today. Will also discuss with infectious disease.    LOS: 4 days   [name] L 07/04/2012, 8:54 AM

## 2012-07-05 ENCOUNTER — Encounter (HOSPITAL_COMMUNITY)
Admission: EM | Disposition: A | Discharge status: Home Health | Payer: Self-pay | Source: Home / Self Care | Attending: Pulmonary Disease

## 2012-07-05 ENCOUNTER — Inpatient Hospital Stay (HOSPITAL_COMMUNITY): Payer: Medicaid Other

## 2012-07-05 ENCOUNTER — Encounter (HOSPITAL_COMMUNITY): Payer: Self-pay | Admitting: Anesthesiology

## 2012-07-05 ENCOUNTER — Encounter (HOSPITAL_COMMUNITY): Payer: Self-pay | Admitting: *Deleted

## 2012-07-05 ENCOUNTER — Inpatient Hospital Stay (HOSPITAL_COMMUNITY): Payer: Medicaid Other | Admitting: Anesthesiology

## 2012-07-05 HISTORY — PX: PORTACATH PLACEMENT: SHX2246

## 2012-07-05 LAB — CULTURE, BLOOD (ROUTINE X 2)

## 2012-07-05 LAB — GLUCOSE, CAPILLARY
Glucose-Capillary: 94 mg/dL (ref 70–99)
Glucose-Capillary: 155 mg/dL — ABNORMAL HIGH (ref 70–99)

## 2012-07-05 SURGERY — INSERTION, TUNNELED CENTRAL VENOUS DEVICE, WITH PORT
Anesthesia: Monitor Anesthesia Care | Site: Chest | Laterality: Left | Wound class: Clean

## 2012-07-05 MED ORDER — FENTANYL CITRATE 0.05 MG/ML IJ SOLN
INTRAMUSCULAR | Status: DC | PRN
Start: 2012-07-05 — End: 2012-07-05
  Administered 2012-07-05: 50 ug via INTRAVENOUS

## 2012-07-05 MED ORDER — HYDROCODONE-ACETAMINOPHEN 5-325 MG PO TABS: 1.0000 | ORAL_TABLET | ORAL | Status: DC | PRN

## 2012-07-05 MED ORDER — MIDAZOLAM HCL 2 MG/2ML IJ SOLN
1.0000 mg | INTRAMUSCULAR | Status: DC | PRN
  Administered 2012-07-05: 2 mg via INTRAVENOUS

## 2012-07-05 MED ORDER — PROPOFOL 10 MG/ML IV BOLUS
INTRAVENOUS | Status: DC | PRN
  Administered 2012-07-05: 10 mg via INTRAVENOUS

## 2012-07-05 MED ORDER — FENTANYL CITRATE 0.05 MG/ML IJ SOLN: 25.0000 ug | INTRAMUSCULAR | Status: DC | PRN

## 2012-07-05 MED ORDER — HEPARIN SODIUM (PORCINE) 1000 UNIT/ML IJ SOLN
INTRAMUSCULAR | Status: DC | PRN
  Administered 2012-07-05: 1000 [IU] via INTRAVENOUS

## 2012-07-05 MED ORDER — LACTATED RINGERS IV SOLN
INTRAVENOUS | Status: DC
  Administered 2012-07-05: 1000 mL via INTRAVENOUS

## 2012-07-05 MED ORDER — ENOXAPARIN SODIUM 80 MG/0.8ML ~~LOC~~ SOLN
80.0000 mg | SUBCUTANEOUS | Status: DC
  Administered 2012-07-05 – 2012-07-07 (×3): 80 mg via SUBCUTANEOUS
  Filled 2012-07-05 (×3): qty 0.8

## 2012-07-05 MED ORDER — PROPOFOL 10 MG/ML IV EMUL
INTRAVENOUS | Status: DC | PRN
  Administered 2012-07-05: 25 ug/kg/min via INTRAVENOUS

## 2012-07-05 MED ORDER — MIDAZOLAM HCL 5 MG/5ML IJ SOLN
INTRAMUSCULAR | Status: DC | PRN
  Administered 2012-07-05: 2 mg via INTRAVENOUS

## 2012-07-05 MED ORDER — MIDAZOLAM HCL 2 MG/2ML IJ SOLN
INTRAMUSCULAR | Status: AC
  Filled 2012-07-05: qty 2

## 2012-07-05 MED ORDER — ONDANSETRON HCL 4 MG/2ML IJ SOLN
INTRAMUSCULAR | Status: AC
  Filled 2012-07-05: qty 2

## 2012-07-05 MED ORDER — ONDANSETRON HCL 4 MG/2ML IJ SOLN
4.0000 mg | Freq: Once | INTRAMUSCULAR | Status: AC
  Administered 2012-07-05: 4 mg via INTRAVENOUS

## 2012-07-05 MED ORDER — LIDOCAINE HCL (PF) 1 % IJ SOLN
INTRAMUSCULAR | Status: DC | PRN
  Administered 2012-07-05: 20 mL

## 2012-07-05 MED ORDER — FENTANYL CITRATE 0.05 MG/ML IJ SOLN
INTRAMUSCULAR | Status: AC
  Filled 2012-07-05: qty 2

## 2012-07-05 MED ORDER — LIDOCAINE HCL (PF) 1 % IJ SOLN
INTRAMUSCULAR | Status: AC
  Filled 2012-07-05: qty 30

## 2012-07-05 MED ORDER — SODIUM CHLORIDE 0.9 % IJ SOLN
INTRAMUSCULAR | Status: DC | PRN
  Administered 2012-07-05: 1 mL via INTRAVENOUS

## 2012-07-05 MED ORDER — ONDANSETRON HCL 4 MG/2ML IJ SOLN: 4.0000 mg | Freq: Once | INTRAMUSCULAR | Status: DC | PRN

## 2012-07-05 SURGICAL SUPPLY — 36 items
APPLIER CLIP 9.375 SM OPEN (CLIP)
BAG DECANTER FOR FLEXI CONT (MISCELLANEOUS) ×2 IMPLANT
BAG HAMPER (MISCELLANEOUS) ×2 IMPLANT
BENZOIN TINCTURE PRP APPL 2/3 (GAUZE/BANDAGES/DRESSINGS) ×2 IMPLANT
CATH HICKMAN DUAL 12.0 (CATHETERS) IMPLANT
CLIP APPLIE 9.375 SM OPEN (CLIP) IMPLANT
CLOTH BEACON ORANGE TIMEOUT ST (SAFETY) ×2 IMPLANT
COVER LIGHT HANDLE STERIS (MISCELLANEOUS) ×4 IMPLANT
DECANTER SPIKE VIAL GLASS SM (MISCELLANEOUS) ×2 IMPLANT
DRAPE C-ARM FOLDED MOBILE STRL (DRAPES) ×2 IMPLANT
DURAPREP 6ML APPLICATOR 50/CS (WOUND CARE) IMPLANT
ELECT REM PT RETURN 9FT ADLT (ELECTROSURGICAL) ×2
ELECTRODE REM PT RTRN 9FT ADLT (ELECTROSURGICAL) ×1 IMPLANT
GLOVE BIOGEL PI IND STRL 7.5 (GLOVE) ×1 IMPLANT
GLOVE BIOGEL PI INDICATOR 7.5 (GLOVE) ×1
GLOVE ECLIPSE 7.0 STRL STRAW (GLOVE) ×2 IMPLANT
GOWN STRL REIN XL XLG (GOWN DISPOSABLE) ×4 IMPLANT
IV NS 500ML (IV SOLUTION) ×1
IV NS 500ML BAXH (IV SOLUTION) ×1 IMPLANT
KIT PORT POWER 8FR ISP MRI (CATHETERS) ×2 IMPLANT
KIT ROOM TURNOVER APOR (KITS) ×2 IMPLANT
MANIFOLD NEPTUNE II (INSTRUMENTS) ×2 IMPLANT
NEEDLE HYPO 18GX1.5 BLUNT FILL (NEEDLE) ×2 IMPLANT
NEEDLE HYPO 25X1 1.5 SAFETY (NEEDLE) ×2 IMPLANT
NS IRRIG 1000ML POUR BTL (IV SOLUTION) ×2 IMPLANT
PACK MINOR (CUSTOM PROCEDURE TRAY) ×2 IMPLANT
PAD ARMBOARD 7.5X6 YLW CONV (MISCELLANEOUS) ×2 IMPLANT
SET BASIN LINEN APH (SET/KITS/TRAYS/PACK) ×2 IMPLANT
SET INTRODUCER 12FR PACEMAKER (SHEATH) IMPLANT
SHEATH COOK PEEL AWAY SET 8F (SHEATH) IMPLANT
STRIP CLOSURE SKIN 1/2X4 (GAUZE/BANDAGES/DRESSINGS) ×2 IMPLANT
SUT MNCRL AB 4-0 PS2 18 (SUTURE) ×2 IMPLANT
SUT VIC AB 3-0 SH 27 (SUTURE) ×1
SUT VIC AB 3-0 SH 27X BRD (SUTURE) ×1 IMPLANT
SYR CONTROL 10ML LL (SYRINGE) ×2 IMPLANT
SYRINGE 10CC LL (SYRINGE) ×2 IMPLANT

## 2012-07-05 NOTE — Op Note (Signed)
Patient:  [name]  DOB:  11/26/1966  MRN:  [id number]   Preop Diagnosis:  Chronic thrombophlebitis  Postop Diagnosis:  The same  Procedure:  Left subclavian Port-A-Cath insertion  Surgeon:  Dr. Chelsea Primus  Anes:  MAC sedation with 1% lidocaine for local  Indications:  Patient is a 46 year old female presented to [facility] with a history of recurrent cellulitis of her right thigh. Patient's has significant comorbidities including morbid obesity and lymphedema. She is going to require long-term antibiotics per her primary care physician. Patient is not a candidate for a PICC line due to unsuccessful attempts in the past. Patient does require long-term antibiotics via IV access. Risks benefits alternatives of a port placement were discussed at length the patient including but not limited to the risk of bleeding, infection, pneumothorax, intraoperative cardiac and pulmonary events. Her questions and concerns are addressed. Patient was consented for the planned procedure.  Procedure note:  Patient is taken to the or is placed in supine position on the or table time the MAC sedation is administered. At this point her left neck and chest wall are prepped with Hibiclens solution. She's placed into a Trendelenburg position after drapes are placed in standard fashion. Local anesthetic is instilled along the planned course of venipuncture. An 18-gauge introducer needle utilized to identify the left subclavian vein. A J-wire is advanced. Fluoroscopy is utilized confirm that the course of the J-wire is down the superior vena cava. After confirmation the J-wire was clamped to the drapes. Attention is now turned to creation of the subcuticular pocket.  Local anesthetic is again instilled. The initial skin incision was created with a 15 blade scalpel. The port is enlarged with a combination of blunt and electrocautery dissection. Once adequately sized the catheter is advanced from the port  sites with a stab incision site. This is done using a subcuticular tunneling device. The insertion sheath dilator combination was then advanced down the J-wire under visualization with fluoroscopy. The course of the dilator removed without difficulty down the J-wire. The J-wire and dilator removed in standard fashion. The catheter is advanced. Confirmation that the catheter is in the superior vena cava is made with fluoroscopy. The insertion sheath was then split and removed in standard fashion. The catheter is trimmed at 26.5 cm. This is elected to the port which is placed into the subcuticular pocket. It is accessed with a Charisse March needle. Good venous return was obtained. 3000 units of heparin were instilled. The course of the catheter is visualized with fluoroscopy in her no sharp angulations or kinking noted. At this point the deep subcuticular 2 at both sites was closed with a 3-0 Vicryl and running continuous fashion. The skin edges were reapproximated using a 4-0 Monocryl. The skin was washed dried moist dry towel. Benzoin is applied around incision. Half-inch are suture placed. The drapes removed the patient left come out of sedation and stretcher the PACU in stable condition. At the conclusion of procedure all instrument, sponge, needle counts are correct. Patient tolerated procedure she well.  A stat portable chest x-ray is ordered and is currently pending in the PACU for confirmation of placement and complications.  Complications:  None apparent  EBL:  Minimal  Specimen:  None

## 2012-07-05 NOTE — Anesthesia Preprocedure Evaluation (Addendum)
Anesthesia Evaluation  Patient identified by MRN, date of birth, ID band Patient awake    Reviewed: Allergy & Precautions, H&P , NPO status , Patient's Chart, lab work & pertinent test results, reviewed documented beta blocker date and time   Airway Mallampati: II  Neck ROM: Full    Dental  (+) Teeth Intact   Pulmonary asthma ,  breath sounds clear to auscultation        Cardiovascular hypertension, Pt. on medications + CAD Rhythm:Regular Rate:Normal     Neuro/Psych    GI/Hepatic PUD, GERD-  Medicated and Controlled,  Endo/Other  Well Controlled, Type 2, Oral Hypoglycemic AgentsMorbid obesity  Renal/GU      Musculoskeletal   Abdominal (+) + obese,   Peds  Hematology   Anesthesia Other Findings   Reproductive/Obstetrics                          Anesthesia Physical Anesthesia Plan  ASA: III  Anesthesia Plan: MAC   Post-op Pain Management:    Induction: Intravenous  Airway Management Planned: Simple Face Mask  Additional Equipment:   Intra-op Plan:   Post-operative Plan:   Informed Consent: I have reviewed the patients History and Physical, chart, labs and discussed the procedure including the risks, benefits and alternatives for the proposed anesthesia with the patient or authorized representative who has indicated his/her understanding and acceptance.     Plan Discussed with:   Anesthesia Plan Comments:         Anesthesia Quick Evaluation

## 2012-07-05 NOTE — Anesthesia Postprocedure Evaluation (Addendum)
  Anesthesia Post-op Note  Patient: [name]  Procedure(s) Performed: Procedure(s) (LRB): INSERTION PORT-A-CATH (Left)  Patient Location: PACU  Anesthesia Type: MAC  Level of Consciousness: awake, alert  and oriented  Airway and Oxygen Therapy: Patient Spontanous Breathing and Patient connected to face mask oxygen  Post-op Pain: mild  Post-op Assessment: Post-op Vital signs reviewed, Patient's Cardiovascular Status Stable, Respiratory Function Stable, Patent Airway and No signs of Nausea or vomiting  Post-op Vital Signs: Reviewed and stable  Complications: No apparent anesthesia complications 3/91/22  Patient doing ok, VSS.  No apparent anesthesia complications.

## 2012-07-05 NOTE — Transfer of Care (Signed)
Immediate Anesthesia Transfer of Care Note  Patient: [name]  Procedure(s) Performed: Procedure(s) (LRB): INSERTION PORT-A-CATH (Left)  Patient Location: PACU  Anesthesia Type: MAC  Level of Consciousness: awake, alert  and oriented  Airway & Oxygen Therapy: Patient Spontanous Breathing and Patient connected to face mask oxygen  Post-op Assessment: Report given to PACU RN  Post vital signs: Reviewed and stable  Complications: No apparent anesthesia complications

## 2012-07-05 NOTE — Progress Notes (Signed)
Day of Surgery  Subjective: No acute change.  Objective: Vital signs in last 24 hours: Temp:  [98.5 F (36.9 C)-99 F (37.2 C)] 99 F (37.2 C) (07/17 0945) Pulse Rate:  [67-78] 70  (07/17 0945) Resp:  [20-27] 26  (07/17 1000) BP: (125-143)/(70-89) 139/89 mmHg (07/17 1000) SpO2:  [92 %-100 %] 100 % (07/17 1000) Weight:  [167.5 kg (369 lb 4.3 oz)] 167.5 kg (369 lb 4.3 oz) (07/17 0520) Last BM Date: 07/03/12  Intake/Output from previous day: 07/16 0701 - 07/17 0700 In: 1150 [P.O.:600; IV Piggyback:550] Out: 2376 [Urine:4850] Intake/Output this shift:    General appearance: alert, no distress and morbidly obese Resp: clear to auscultation bilaterally Cardio: regular rate and rhythm  Lab Results:   [facility] 07/04/12 2232 07/03/12 0454  WBC 9.3 6.2  HGB 10.6* 10.7*  HCT 32.5* 33.6*  PLT 261 273   BMET  Basename 07/04/12 2232 07/03/12 0454  NA 136 138  K 4.2 4.9  CL 93* 99  CO2 35* 30  GLUCOSE 153* 148*  BUN 28* 29*  CREATININE 1.75* 1.94*  CALCIUM 9.8 9.5   PT/INR No results found for this basename: LABPROT:2,INR:2 in the last 72 hours ABG No results found for this basename: PHART:2,PCO2:2,PO2:2,HCO3:2 in the last 72 hours  Studies/Results: No results found.  Anti-infectives: Anti-infectives     Start     Dose/Rate Route Frequency Ordered Stop   07/01/12 1000   ceftaroline (TEFLARO) 600 mg in sodium chloride 0.9 % 250 mL IVPB        600 mg 250 mL/hr over 60 Minutes Intravenous Every 12 hours 07/01/12 0847     06/30/12 2200   clindamycin (CLEOCIN) IVPB 900 mg  Status:  Discontinued        900 mg 100 mL/hr over 30 Minutes Intravenous 3 times per day 06/30/12 1855 07/01/12 0847   06/30/12 1530   clindamycin (CLEOCIN) IVPB 300 mg  Status:  Discontinued        900 mg 300 mL/hr over 30 Minutes Intravenous  Once 06/30/12 1523 06/30/12 1528   06/30/12 1530   clindamycin (CLEOCIN) IVPB 900 mg        900 mg 100 mL/hr over 30 Minutes Intravenous  Once 06/30/12  1527 06/30/12 1600   06/30/12 1517   clindamycin (CLEOCIN) 900 MG/50ML IVPB     Comments: [employer] Ramp: cabinet override         06/30/12 1517 06/30/12 1515   06/30/12 1500   clindamycin (CLEOCIN) 900 mg in dextrose 5 % 100 mL IVPB  Status:  Discontinued        900 mg 200 mL/hr over 30 Minutes Intravenous  Once 06/30/12 1452 06/30/12 1523          Assessment/Plan: s/p Procedure(s) (LRB): INSERTION PORT-A-CATH (N/A) Risks, benefits, and alternatives were discussed in with the patient. Her questions and concerns were addressed. Patient is consented for the planned procedure.  LOS: 5 days    Kaevon Cotta C 07/05/2012

## 2012-07-05 NOTE — Progress Notes (Signed)
Subjective: She says she feels better. She is still having some nausea but not as much. She is set for Port-A-Cath placement today. I discussed her situation with Dr. Drucilla Schmidt the infectious disease specialist and he feels that she could be seen as an outpatient and may be benefited from suppressive therapy  Objective: Vital signs in last 24 hours: Temp:  [98.5 F (36.9 C)-99 F (37.2 C)] 99 F (37.2 C) (07/17 0520) Pulse Rate:  [67-78] 67  (07/17 0520) Resp:  [20] 20  (07/17 0520) BP: (125-143)/(70-84) 125/70 mmHg (07/17 0520) SpO2:  [92 %-96 %] 96 % (07/17 0520) Weight:  [167.5 kg (369 lb 4.3 oz)] 167.5 kg (369 lb 4.3 oz) (07/17 0520) Weight change: -7.6 kg (-16 lb 12.1 oz) Last BM Date: 07/03/12  Intake/Output from previous day: 07/16 0701 - 07/17 0700 In: 1150 [P.O.:600; IV Piggyback:550] Out: 0816 [Urine:4550]  PHYSICAL EXAM General appearance: alert, cooperative and morbidly obese Resp: clear to auscultation bilaterally Cardio: regular rate and rhythm, S1, S2 normal, no murmur, click, rub or gallop GI: soft, non-tender; bowel sounds normal; no masses,  no organomegaly Extremities: Her left thigh shows lymphedema and is still firm but not as warm  Lab Results:    Basic Metabolic Panel:  Basename 07/04/12 2232 07/03/12 0454  NA 136 138  K 4.2 4.9  CL 93* 99  CO2 35* 30  GLUCOSE 153* 148*  BUN 28* 29*  CREATININE 1.75* 1.94*  CALCIUM 9.8 9.5  MG -- --  PHOS -- --   Liver Function Tests: No results found for this basename: AST:2,ALT:2,ALKPHOS:2,BILITOT:2,PROT:2,ALBUMIN:2 in the last 72 hours No results found for this basename: LIPASE:2,AMYLASE:2 in the last 72 hours No results found for this basename: AMMONIA:2 in the last 72 hours CBC:  Basename 07/04/12 2232 07/03/12 0454  WBC 9.3 6.2  NEUTROABS 7.2 4.4  HGB 10.6* 10.7*  HCT 32.5* 33.6*  MCV 88.6 89.8  PLT 261 273   Cardiac Enzymes: No results found for this basename:  CKTOTAL:3,CKMB:3,CKMBINDEX:3,TROPONINI:3 in the last 72 hours BNP: No results found for this basename: PROBNP:3 in the last 72 hours D-Dimer: No results found for this basename: DDIMER:2 in the last 72 hours CBG:  Basename 07/05/12 0733 07/04/12 2043 07/04/12 1601 07/04/12 1129 07/04/12 1113 07/04/12 0712  GLUCAP 100* 115* 114* 95 65* 72   Hemoglobin A1C: No results found for this basename: HGBA1C in the last 72 hours Fasting Lipid Panel: No results found for this basename: CHOL,HDL,LDLCALC,TRIG,CHOLHDL,LDLDIRECT in the last 72 hours Thyroid Function Tests: No results found for this basename: TSH,T4TOTAL,FREET4,T3FREE,THYROIDAB in the last 72 hours Anemia Panel: No results found for this basename: VITAMINB12,FOLATE,FERRITIN,TIBC,IRON,RETICCTPCT in the last 72 hours Coagulation: No results found for this basename: LABPROT:2,INR:2 in the last 72 hours Urine Drug Screen: Drugs of Abuse  No results found for this basename: labopia, cocainscrnur, labbenz, amphetmu, thcu, labbarb    Alcohol Level: No results found for this basename: ETH:2 in the last 72 hours Urinalysis: No results found for this basename: COLORURINE:2,APPERANCEUR:2,LABSPEC:2,PHURINE:2,GLUCOSEU:2,HGBUR:2,BILIRUBINUR:2,KETONESUR:2,PROTEINUR:2,UROBILINOGEN:2,NITRITE:2,LEUKOCYTESUR:2 in the last 72 hours Misc. Labs:  ABGS No results found for this basename: PHART,PCO2,PO2ART,TCO2,HCO3 in the last 72 hours CULTURES Recent Results (from the past 240 hour(s))  CULTURE, BLOOD (ROUTINE X 2)     Status: Normal (Preliminary result)   Collection Time   06/30/12 12:08 PM      Component Value Range Status Comment   Specimen Description BLOOD LEFT ARM   Final    Special Requests BOTTLES DRAWN AEROBIC AND ANAEROBIC 8CC  Final    Culture NO GROWTH 4 DAYS   Final    Report Status PENDING   Incomplete   CULTURE, BLOOD (ROUTINE X 2)     Status: Normal (Preliminary result)   Collection Time   06/30/12 12:15 PM      Component Value  Range Status Comment   Specimen Description BLOOD RIGHT HAND   Final    Special Requests BOTTLES DRAWN AEROBIC ONLY  6 CC   Final    Culture NO GROWTH 4 DAYS   Final    Report Status PENDING   Incomplete   SURGICAL PCR SCREEN     Status: Normal   Collection Time   07/04/12  8:16 PM      Component Value Range Status Comment   MRSA, PCR NEGATIVE  NEGATIVE Final    Staphylococcus aureus NEGATIVE  NEGATIVE Final    Studies/Results: No results found.  Medications:  Scheduled:   . amLODipine  10 mg Oral Daily  . antiseptic oral rinse  15 mL Mouth Rinse BID  . ceFTAROline (TEFLARO) IV  600 mg Intravenous Q12H  . chlorhexidine  1 application Topical Once  . enalapril  2.5 mg Oral Daily  . enoxaparin (LOVENOX) injection  40 mg Subcutaneous Q24H  . insulin aspart  0-20 Units Subcutaneous TID WC  . insulin aspart  0-5 Units Subcutaneous QHS  . insulin glargine  40 Units Subcutaneous BID  . meclizine  25 mg Oral TID  . metoprolol  50 mg Oral BID  . pantoprazole  40 mg Oral BID AC  . potassium chloride  20 mEq Oral BID  . torsemide  50 mg Oral BID   Continuous:   . sodium chloride 20 mL/hr (07/04/12 2010)   NSQ:[id number], acetaminophen, alum & mag hydroxide-simeth, HYDROcodone-acetaminophen, HYDROmorphone (DILAUDID) injection, ondansetron (ZOFRAN) IV, ondansetron, promethazine  Assesment: She has recurrent cellulitis of the leg. This is a least partially related to her chronic lymphedema. She has diabetes. Principal Problem:  *Cellulitis of left leg Active Problems:  Lymphedema of lower extremity  Diabetes mellitus  Morbid obesity  HTN (hypertension)    Plan: She will have Port-A-Cath placement today and may be able to be discharged tomorrow on IV antibiotics    LOS: 5 days   Dusty Raczkowski L 07/05/2012, 8:47 AM

## 2012-07-05 NOTE — Progress Notes (Signed)
Chest xray good no pneumothorax  Per dr ziegler .

## 2012-07-06 LAB — GLUCOSE, CAPILLARY
Glucose-Capillary: 169 mg/dL — ABNORMAL HIGH (ref 70–99)
Glucose-Capillary: 192 mg/dL — ABNORMAL HIGH (ref 70–99)
Glucose-Capillary: 115 mg/dL — ABNORMAL HIGH (ref 70–99)

## 2012-07-06 LAB — BASIC METABOLIC PANEL
CO2: 32 mEq/L (ref 19–32)
Calcium: 9 mg/dL (ref 8.4–10.5)
Chloride: 93 mEq/L — ABNORMAL LOW (ref 96–112)
Glucose, Bld: 155 mg/dL — ABNORMAL HIGH (ref 70–99)
Potassium: 5 mEq/L (ref 3.5–5.1)
Sodium: 134 mEq/L — ABNORMAL LOW (ref 135–145)

## 2012-07-06 MED ORDER — METOCLOPRAMIDE HCL 10 MG PO TABS
5.0000 mg | ORAL_TABLET | Freq: Three times a day (TID) | ORAL | Status: DC
  Administered 2012-07-06 – 2012-07-08 (×9): 5 mg via ORAL
  Filled 2012-07-06: qty 2
  Filled 2012-07-06 (×8): qty 1

## 2012-07-06 MED ORDER — ONDANSETRON HCL 4 MG/2ML IJ SOLN
INTRAMUSCULAR | Status: AC
  Filled 2012-07-06: qty 4

## 2012-07-06 MED ORDER — ONDANSETRON 8 MG/NS 50 ML IVPB
8.0000 mg | Freq: Four times a day (QID) | INTRAVENOUS | Status: DC | PRN
  Administered 2012-07-06: 8 mg via INTRAVENOUS
  Filled 2012-07-06: qty 8

## 2012-07-06 NOTE — Addendum Note (Signed)
Addendum  created 07/06/12 0931 by Ollen Bowl, CRNA   Modules edited:Notes Section

## 2012-07-06 NOTE — Progress Notes (Signed)
Refused am Torsemide 11m PO & Liquid Potassium 20MEQ  PO due to complaints of nausea.

## 2012-07-06 NOTE — Progress Notes (Signed)
She says she feels about the same. She continues to have nausea. Her leg is better. She tells the that she's been nauseated home as well as in the hospital. Her Port-A-Cath was placed yesterday and seems to be doing okay. She has no other new complaints.  Her physical examination shows a morbidly obese African American female in no acute distress. Her pupils are reactive. Her nose and throat clear. Her neck is supple. Her temperature is 99.4 pulse 73 respirations 20 blood pressure 167 O2 sats 90% of her leg still shows marked lymphedema and it is still firm and mildly erythematous  By assessment is that she has lymphedema. She has diabetes which is stable. She has morbid obesity. She has cellulitis in the area of lymphedema on her leg. She now has a Port-A-Cath in place she has acute on chronic nausea  I would add Reglan. Because her renal function was marginal I'm going to repeat basic metabolic profile today

## 2012-07-06 NOTE — Progress Notes (Signed)
UR Chart Review Completed

## 2012-07-07 ENCOUNTER — Encounter (HOSPITAL_COMMUNITY): Payer: Self-pay | Admitting: General Surgery

## 2012-07-07 LAB — CREATININE, SERUM
Creatinine, Ser: 2.17 mg/dL — ABNORMAL HIGH (ref 0.50–1.10)
GFR calc Af Amer: 30 mL/min — ABNORMAL LOW (ref >90)
GFR calc non Af Amer: 26 mL/min — ABNORMAL LOW (ref >90)

## 2012-07-07 LAB — GLUCOSE, CAPILLARY
Glucose-Capillary: 112 mg/dL — ABNORMAL HIGH (ref 70–99)
Glucose-Capillary: 179 mg/dL — ABNORMAL HIGH (ref 70–99)

## 2012-07-07 MED ORDER — MAGIC MOUTHWASH
10.0000 mL | Freq: Four times a day (QID) | ORAL | Status: DC
  Administered 2012-07-07 – 2012-07-08 (×5): 10 mL via ORAL
  Filled 2012-07-07 (×5): qty 10

## 2012-07-07 NOTE — Progress Notes (Signed)
She says she feels better today but still nauseated and not quite "right". Her renal function had worsened yesterday but is better today. She's not having as much pain  She is awake and alert. She has marked lymphedema of both legs and her cellulitis of her left leg is improving although she still somewhat warm than the 5. Her temperature is 90.8, pulse 63, respirations are 19 blood pressure 132/76 and O2 sats 91%. Her creatinine has come down to 2.17  She has cellulitis of the leg which is improving. She has poor venous access and now has a Port-A-Cath in place. She has acute on chronic renal failure which is improved. She has morbid obesity which is of course unchanged. She has lymphedema of both legs unchanged. She is mildly hyponatremic. She has had significant problems with abdominal discomfort nausea and vomiting and that's better with Reglan she is diabetic  I think she's probably going to require one more day of inpatient care. I will plan for discharge tomorrow

## 2012-07-08 LAB — GLUCOSE, CAPILLARY: Glucose-Capillary: 118 mg/dL — ABNORMAL HIGH (ref 70–99)

## 2012-07-08 LAB — BASIC METABOLIC PANEL
CO2: 32 mEq/L (ref 19–32)
Calcium: 9.2 mg/dL (ref 8.4–10.5)
Creatinine, Ser: 2.1 mg/dL — ABNORMAL HIGH (ref 0.50–1.10)
GFR calc non Af Amer: 27 mL/min — ABNORMAL LOW (ref >90)

## 2012-07-08 MED ORDER — SODIUM CHLORIDE 0.9 % IV SOLN: 600.0000 mg | Freq: Two times a day (BID) | INTRAVENOUS | Status: DC

## 2012-07-08 MED ORDER — HEPARIN SOD (PORK) LOCK FLUSH 100 UNIT/ML IV SOLN: 500.0000 [IU] | INTRAVENOUS | Status: DC | PRN

## 2012-07-08 MED ORDER — HEPARIN SOD (PORK) LOCK FLUSH 100 UNIT/ML IV SOLN
500.0000 [IU] | INTRAVENOUS | Status: DC
  Administered 2012-07-08: 500 [IU]
  Filled 2012-07-08: qty 5

## 2012-07-08 NOTE — Progress Notes (Signed)
[facility] set up and paperwork faxed.

## 2012-07-08 NOTE — Progress Notes (Signed)
She says she feels much better and wants to go home. She has no new complaints. She has a Port-A-Cath in place.  She is awake and alert. She is morbidly obese. Her temperature is 97.5 pulse 62 respirations 20 blood pressure 132/83 O2 sats 94% her BUN is 38 creatinine 2.10 and both of those are somewhat better. Her lymphedema is about the same but her leg feels better. Her chest is clear.  My assessment is that she is improved and ready for discharge  Discharge home today please see discharge summary for details

## 2012-07-10 NOTE — Discharge Summary (Signed)
Physician Discharge Summary  Patient ID: [name] MRN: [id number] DOB/AGE: 03/29/1966 46 y.o. Primary Care Physician:Luisdavid Hamblin L, MD Admit date: 06/30/2012 Discharge date: 07/10/2012    Discharge Diagnoses:   Principal Problem:  *Cellulitis of left leg Active Problems:  Lymphedema of lower extremity  Diabetes mellitus  Morbid obesity  HTN (hypertension) chronic renal failure  Medication List  As of 07/10/2012  8:52 AM   TAKE these medications         acetaminophen 325 MG tablet   Commonly known as: TYLENOL   Take 650 mg by mouth every 6 (six) hours as needed. fever      amLODipine 10 MG tablet   Commonly known as: NORVASC   Take 1 tablet (10 mg total) by mouth daily.      enalapril 2.5 MG tablet   Commonly known as: VASOTEC   Take 2.5 mg by mouth daily.      HYDROcodone-acetaminophen 10-325 MG per tablet   Commonly known as: NORCO   Take 1 tablet by mouth 4 (four) times daily.      insulin regular 100 units/mL injection   Commonly known as: NOVOLIN R,HUMULIN R   Inject 60 Units into the skin 2 (two) times daily.      metoprolol 50 MG tablet   Commonly known as: LOPRESSOR   Take 1 tablet (50 mg total) by mouth 2 (two) times daily.      pantoprazole 40 MG tablet   Commonly known as: PROTONIX   Take 1 tablet (40 mg total) by mouth 2 (two) times daily before a meal.      potassium chloride 20 MEQ/15ML (10%) solution   Take 20 mEq by mouth 2 (two) times daily.      promethazine 25 MG tablet   Commonly known as: PHENERGAN   Take 25 mg by mouth 4 (four) times daily as needed. nausea      promethazine 25 MG tablet   Commonly known as: PHENERGAN   Take 0.5 tablets (12.5 mg total) by mouth every 6 (six) hours as needed for nausea.      sodium chloride 0.9 % SOLN 250 mL with ceftaroline 600 MG SOLR 600 mg   Inject 600 mg into the vein every 12 (twelve) hours.      torsemide 100 MG tablet   Commonly known as: DEMADEX   Take 50 mg by mouth 2 (two) times  daily.            Discharged Condition: Improved    Consults: Surgery for Port-A-Cath placement  Significant Diagnostic Studies: Dg Chest Port 1 View  07/05/2012  *RADIOLOGY REPORT*  Clinical Data: Left Port-A-Cath placement  PORTABLE CHEST - 1 VIEW  Comparison: Portable exam 1154 hours compared to 06/30/2012  Findings: New left subclavian Port-A-Cath, tip projecting over SVC. No pneumothorax. Enlargement of cardiac silhouette with pulmonary vascular congestion. Bibasilar atelectasis. Lungs otherwise clear. No pleural effusion or acute osseous findings.  IMPRESSION: No pneumothorax following left subclavian Port-A-Cath insertion. Enlargement of cardiac silhouette with pulmonary vascular congestion. Bibasilar atelectasis.  Findings discussed with Dr. Geroge Baseman on 07/05/2012 at 1159 hours.  Original Report Authenticated By: Burnetta Sabin, M.D.   Dg Chest Portable 1 View  06/30/2012  *RADIOLOGY REPORT*  Clinical Data: Shortness of breath  PORTABLE CHEST - 1 VIEW  Comparison: Portable exam 1229 hours compared to 03/11/2012  Findings: Enlargement of cardiac silhouette with pulmonary vascular congestion. Mediastinal contours normal. Underpenetration at lung bases. Question mild right basilar atelectasis. No definite  infiltrate, pleural effusion, or pneumothorax. No acute osseous findings.  IMPRESSION: Enlargement of cardiac silhouette with pulmonary vascular congestion. Question mild right basilar atelectasis.  Original Report Authenticated By: Burnetta Sabin, M.D.   Dg C-arm 1-60 Min-no Report  07/05/2012  CLINICAL DATA: port-a-cath insertion   C-ARM 1-60 MINUTES  Fluoroscopy was utilized by the requesting physician.  No radiographic  interpretation.      Lab Results: Basic Metabolic Panel:  Basename 07/08/12 0658  NA 136  K 5.0  CL 97  CO2 32  GLUCOSE 105*  BUN 38*  CREATININE 2.10*  CALCIUM 9.2  MG --  PHOS --   Liver Function Tests: No results found for this basename:  AST:2,ALT:2,ALKPHOS:2,BILITOT:2,PROT:2,ALBUMIN:2 in the last 72 hours   CBC: No results found for this basename: WBC:2,NEUTROABS:2,HGB:2,HCT:2,MCV:2,PLT:2 in the last 72 hours  Recent Results (from the past 240 hour(s))  CULTURE, BLOOD (ROUTINE X 2)     Status: Normal   Collection Time   06/30/12 12:08 PM      Component Value Range Status Comment   Specimen Description BLOOD LEFT ARM   Final    Special Requests BOTTLES DRAWN AEROBIC AND ANAEROBIC 8CC   Final    Culture NO GROWTH 5 DAYS   Final    Report Status 07/05/2012 FINAL   Final   CULTURE, BLOOD (ROUTINE X 2)     Status: Normal   Collection Time   06/30/12 12:15 PM      Component Value Range Status Comment   Specimen Description BLOOD RIGHT HAND   Final    Special Requests BOTTLES DRAWN AEROBIC ONLY  6 CC   Final    Culture NO GROWTH 5 DAYS   Final    Report Status 07/05/2012 FINAL   Final   SURGICAL PCR SCREEN     Status: Normal   Collection Time   07/04/12  8:16 PM      Component Value Range Status Comment   MRSA, PCR NEGATIVE  NEGATIVE Final    Staphylococcus aureus NEGATIVE  NEGATIVE Final      Hospital Course: She was admitted with recurrent cellulitis of the leg. She has chronic lymphedema and has recurrent cellulitis partially at least because of this. She developed fever and chills. IV access is an issue so she had Port-A-Cath placed because she was not able to have a PICC line here or at her last admission her renal function down to around a bit but her creatinine is approximately 2  Discharge Exam: Blood pressure 132/83, pulse 62, temperature 97.5 F (36.4 C), temperature source Oral, resp. rate 20, height _0  (1.651 m), weight 170.4 kg (375 lb 10.6 oz), SpO2 94.00%. She is morbidly obese. She is awake and alert. Her chest is clear. Her leg is much better. The Port-A-Cath is functioning normally  Disposition: Home with home health services  Discharge Orders    Future Orders Please Complete By Expires   Home  Health      Questions: Responses:   To provide the following care/treatments PT    RN   Face-to-face encounter      Scheduling Instructions:   Ok to give antibiotics via PAC. Lab flush etc per [facility] protocol   Comments:   I Kriya Westra L certify that this patient is under my care and that I, or a nurse practitioner or physician's assistant working with me, had a face-to-face encounter that meets the physician face-to-face encounter requirements with this patient on 07/08/2012.   Questions: Responses:  The encounter with the patient was in whole, or in part, for the following medical condition, which is the primary reason for home health care cellulitis   I certify that, based on my findings, the following services are medically necessary home health services Nursing    Physical therapy   My clinical findings support the need for the above services Infection requiring IV antibiotics   Further, I certify that my clinical findings support that this patient is homebound (i.e. absences from home require considerable and taxing effort and are for medical reasons or religious services or infrequently or of short duration when for other reasons) Ambulates short distances less than 300 feet   To provide the following care/treatments PT    RN   Discharge patient         Follow-up Information    Schedule an appointment as soon as possible for a visit with Olinda.         SignedAlonza Bogus Pager [phone]  07/10/2012, 8:52 AM

## 2012-08-10 ENCOUNTER — Encounter (HOSPITAL_COMMUNITY): Payer: Medicaid Other | Attending: Pulmonary Disease

## 2012-08-10 DIAGNOSIS — Z95828 Presence of other vascular implants and grafts: Secondary | ICD-10-CM

## 2012-08-10 DIAGNOSIS — L02419 Cutaneous abscess of limb, unspecified: Secondary | ICD-10-CM | POA: Insufficient documentation

## 2012-08-10 DIAGNOSIS — Z452 Encounter for adjustment and management of vascular access device: Secondary | ICD-10-CM | POA: Insufficient documentation

## 2012-08-10 MED ORDER — HEPARIN SOD (PORK) LOCK FLUSH 100 UNIT/ML IV SOLN
500.0000 [IU] | Freq: Once | INTRAVENOUS | Status: AC
  Administered 2012-08-10: 500 [IU] via INTRAVENOUS

## 2012-08-10 MED ORDER — SODIUM CHLORIDE 0.9 % IJ SOLN
10.0000 mL | INTRAMUSCULAR | Status: DC | PRN
  Administered 2012-08-10: 10 mL via INTRAVENOUS

## 2012-08-10 NOTE — Progress Notes (Signed)
[name] presented for Portacath access and flush. Proper placement of portacath confirmed by CXR. Portacath located lt chest wall accessed with  H 20 needle. Good blood return present. Portacath flushed with 14m NS and 500U/575mHeparin and needle removed intact. Procedure without incident. Patient tolerated procedure well.

## 2012-08-31 ENCOUNTER — Encounter (HOSPITAL_COMMUNITY): Payer: Self-pay

## 2012-08-31 ENCOUNTER — Emergency Department (HOSPITAL_COMMUNITY): Payer: Medicaid Other

## 2012-08-31 ENCOUNTER — Inpatient Hospital Stay (HOSPITAL_COMMUNITY)
Admission: EM | Admit: 2012-08-31 | Discharge: 2012-09-09 | DRG: 602 | Disposition: A | Discharge status: Home / Self Care | Payer: Medicaid Other | Attending: Pulmonary Disease | Admitting: Pulmonary Disease

## 2012-08-31 ENCOUNTER — Inpatient Hospital Stay (HOSPITAL_COMMUNITY): Payer: Medicaid Other

## 2012-08-31 DIAGNOSIS — I2510 Atherosclerotic heart disease of native coronary artery without angina pectoris: Secondary | ICD-10-CM | POA: Diagnosis present

## 2012-08-31 DIAGNOSIS — E0789 Other specified disorders of thyroid: Secondary | ICD-10-CM | POA: Diagnosis present

## 2012-08-31 DIAGNOSIS — E662 Morbid (severe) obesity with alveolar hypoventilation: Secondary | ICD-10-CM | POA: Diagnosis present

## 2012-08-31 DIAGNOSIS — E119 Type 2 diabetes mellitus without complications: Secondary | ICD-10-CM | POA: Diagnosis present

## 2012-08-31 DIAGNOSIS — G8929 Other chronic pain: Secondary | ICD-10-CM | POA: Diagnosis present

## 2012-08-31 DIAGNOSIS — N183 Chronic kidney disease, stage 3 unspecified: Secondary | ICD-10-CM | POA: Diagnosis present

## 2012-08-31 DIAGNOSIS — N2581 Secondary hyperparathyroidism of renal origin: Secondary | ICD-10-CM | POA: Diagnosis present

## 2012-08-31 DIAGNOSIS — L0390 Cellulitis, unspecified: Secondary | ICD-10-CM

## 2012-08-31 DIAGNOSIS — R112 Nausea with vomiting, unspecified: Secondary | ICD-10-CM | POA: Diagnosis present

## 2012-08-31 DIAGNOSIS — Z6841 Body Mass Index (BMI) 40.0 and over, adult: Secondary | ICD-10-CM

## 2012-08-31 DIAGNOSIS — L03116 Cellulitis of left lower limb: Secondary | ICD-10-CM | POA: Diagnosis present

## 2012-08-31 DIAGNOSIS — Z794 Long term (current) use of insulin: Secondary | ICD-10-CM

## 2012-08-31 DIAGNOSIS — I890 Lymphedema, not elsewhere classified: Secondary | ICD-10-CM | POA: Diagnosis present

## 2012-08-31 DIAGNOSIS — Z881 Allergy status to other antibiotic agents status: Secondary | ICD-10-CM

## 2012-08-31 DIAGNOSIS — Z79899 Other long term (current) drug therapy: Secondary | ICD-10-CM

## 2012-08-31 DIAGNOSIS — E875 Hyperkalemia: Secondary | ICD-10-CM | POA: Diagnosis present

## 2012-08-31 DIAGNOSIS — I10 Essential (primary) hypertension: Secondary | ICD-10-CM | POA: Diagnosis present

## 2012-08-31 DIAGNOSIS — Z887 Allergy status to serum and vaccine status: Secondary | ICD-10-CM

## 2012-08-31 DIAGNOSIS — I129 Hypertensive chronic kidney disease with stage 1 through stage 4 chronic kidney disease, or unspecified chronic kidney disease: Secondary | ICD-10-CM | POA: Diagnosis present

## 2012-08-31 DIAGNOSIS — Z91013 Allergy to seafood: Secondary | ICD-10-CM

## 2012-08-31 DIAGNOSIS — E872 Acidosis, unspecified: Secondary | ICD-10-CM | POA: Diagnosis present

## 2012-08-31 DIAGNOSIS — Z91041 Radiographic dye allergy status: Secondary | ICD-10-CM

## 2012-08-31 DIAGNOSIS — N179 Acute kidney failure, unspecified: Secondary | ICD-10-CM | POA: Diagnosis present

## 2012-08-31 DIAGNOSIS — E7800 Pure hypercholesterolemia, unspecified: Secondary | ICD-10-CM | POA: Diagnosis present

## 2012-08-31 DIAGNOSIS — L02419 Cutaneous abscess of limb, unspecified: Principal | ICD-10-CM | POA: Diagnosis present

## 2012-08-31 DIAGNOSIS — D649 Anemia, unspecified: Secondary | ICD-10-CM | POA: Diagnosis present

## 2012-08-31 DIAGNOSIS — J45909 Unspecified asthma, uncomplicated: Secondary | ICD-10-CM | POA: Diagnosis present

## 2012-08-31 DIAGNOSIS — J189 Pneumonia, unspecified organism: Secondary | ICD-10-CM | POA: Diagnosis present

## 2012-08-31 LAB — URINALYSIS, ROUTINE W REFLEX MICROSCOPIC
Glucose, UA: NEGATIVE mg/dL
Hgb urine dipstick: NEGATIVE
Ketones, ur: NEGATIVE mg/dL
Leukocytes, UA: NEGATIVE
Protein, ur: NEGATIVE mg/dL
pH: 7 (ref 5.0–8.0)

## 2012-08-31 LAB — BLOOD GAS, ARTERIAL
Acid-Base Excess: 3.8 mmol/L — ABNORMAL HIGH (ref 0.0–2.0)
TCO2: 29.8 mmol/L (ref 0–100)
pCO2 arterial: 79.1 mmHg (ref 35.0–45.0)
pO2, Arterial: 261 mmHg — ABNORMAL HIGH (ref 80.0–100.0)

## 2012-08-31 LAB — CBC WITH DIFFERENTIAL/PLATELET
Basophils Absolute: 0 10*3/uL (ref 0.0–0.1)
Basophils Relative: 1 % (ref 0–1)
Eosinophils Absolute: 0.4 10*3/uL (ref 0.0–0.7)
Hemoglobin: 10.2 g/dL — ABNORMAL LOW (ref 12.0–15.0)
MCH: 27.3 pg (ref 26.0–34.0)
MCHC: 31.6 g/dL (ref 30.0–36.0)
Neutro Abs: 5.1 10*3/uL (ref 1.7–7.7)
Neutrophils Relative %: 62 % (ref 43–77)
Platelets: 304 10*3/uL (ref 150–400)
RDW: 15.8 % — ABNORMAL HIGH (ref 11.5–15.5)

## 2012-08-31 LAB — COMPREHENSIVE METABOLIC PANEL
AST: 14 U/L (ref 0–37)
Albumin: 3.1 g/dL — ABNORMAL LOW (ref 3.5–5.2)
Alkaline Phosphatase: 92 U/L (ref 39–117)
Chloride: 96 mEq/L (ref 96–112)
Potassium: 4.8 mEq/L (ref 3.5–5.1)
Total Bilirubin: 0.2 mg/dL — ABNORMAL LOW (ref 0.3–1.2)
Total Protein: 7.6 g/dL (ref 6.0–8.3)

## 2012-08-31 LAB — GLUCOSE, CAPILLARY: Glucose-Capillary: 182 mg/dL — ABNORMAL HIGH (ref 70–99)

## 2012-08-31 MED ORDER — DEXTROSE 5 % IV SOLN
1.0000 g | Freq: Once | INTRAVENOUS | Status: AC
  Administered 2012-08-31: 1 g via INTRAVENOUS
  Filled 2012-08-31: qty 10

## 2012-08-31 MED ORDER — ONDANSETRON HCL 4 MG/2ML IJ SOLN: 4.0000 mg | Freq: Four times a day (QID) | INTRAMUSCULAR | Status: DC | PRN

## 2012-08-31 MED ORDER — SODIUM CHLORIDE 0.9 % IV SOLN
600.0000 mg | Freq: Two times a day (BID) | INTRAVENOUS | Status: DC
  Administered 2012-08-31 – 2012-09-08 (×18): 600 mg via INTRAVENOUS
  Filled 2012-08-31 (×24): qty 600

## 2012-08-31 MED ORDER — ACETAMINOPHEN 325 MG PO TABS: 650.0000 mg | ORAL_TABLET | Freq: Four times a day (QID) | ORAL | Status: DC | PRN

## 2012-08-31 MED ORDER — METOPROLOL TARTRATE 50 MG PO TABS
50.0000 mg | ORAL_TABLET | Freq: Two times a day (BID) | ORAL | Status: DC
  Administered 2012-08-31 – 2012-09-09 (×18): 50 mg via ORAL
  Filled 2012-08-31 (×18): qty 1

## 2012-08-31 MED ORDER — AMLODIPINE BESYLATE 5 MG PO TABS
10.0000 mg | ORAL_TABLET | Freq: Every day | ORAL | Status: DC
  Administered 2012-08-31 – 2012-09-09 (×10): 10 mg via ORAL
  Filled 2012-08-31 (×10): qty 2

## 2012-08-31 MED ORDER — INSULIN GLARGINE 100 UNIT/ML ~~LOC~~ SOLN
30.0000 [IU] | Freq: Every day | SUBCUTANEOUS | Status: DC
  Administered 2012-08-31 – 2012-09-06 (×6): 30 [IU] via SUBCUTANEOUS

## 2012-08-31 MED ORDER — ONDANSETRON HCL 4 MG/2ML IJ SOLN
4.0000 mg | Freq: Once | INTRAMUSCULAR | Status: AC
  Administered 2012-08-31: 4 mg via INTRAVENOUS
  Filled 2012-08-31: qty 2

## 2012-08-31 MED ORDER — INSULIN ASPART 100 UNIT/ML ~~LOC~~ SOLN
0.0000 [IU] | Freq: Every day | SUBCUTANEOUS | Status: DC
  Administered 2012-08-31: 2 [IU] via SUBCUTANEOUS

## 2012-08-31 MED ORDER — SODIUM CHLORIDE 0.9 % IV SOLN
INTRAVENOUS | Status: DC
  Administered 2012-08-31: 20 mL/h via INTRAVENOUS
  Administered 2012-09-02: 500 mL via INTRAVENOUS
  Administered 2012-09-02 – 2012-09-03 (×2): via INTRAVENOUS
  Administered 2012-09-03: 1000 mL via INTRAVENOUS
  Administered 2012-09-03: 20:00:00 via INTRAVENOUS
  Administered 2012-09-04: 1000 mL via INTRAVENOUS
  Administered 2012-09-04 (×2): via INTRAVENOUS

## 2012-08-31 MED ORDER — SODIUM CHLORIDE 0.9 % IJ SOLN
INTRAMUSCULAR | Status: AC
  Filled 2012-08-31: qty 3

## 2012-08-31 MED ORDER — HYDROMORPHONE HCL PF 1 MG/ML IJ SOLN
1.0000 mg | Freq: Once | INTRAMUSCULAR | Status: AC
  Administered 2012-08-31: 1 mg via INTRAVENOUS
  Filled 2012-08-31: qty 1

## 2012-08-31 MED ORDER — ONDANSETRON HCL 4 MG PO TABS: 4.0000 mg | ORAL_TABLET | Freq: Four times a day (QID) | ORAL | Status: DC | PRN

## 2012-08-31 MED ORDER — ONDANSETRON HCL 4 MG/2ML IJ SOLN
4.0000 mg | Freq: Four times a day (QID) | INTRAMUSCULAR | Status: DC | PRN
  Administered 2012-08-31 – 2012-09-01 (×3): 4 mg via INTRAVENOUS
  Filled 2012-08-31 (×2): qty 2

## 2012-08-31 MED ORDER — ALBUTEROL SULFATE (5 MG/ML) 0.5% IN NEBU
2.5000 mg | INHALATION_SOLUTION | RESPIRATORY_TRACT | Status: DC | PRN
  Administered 2012-08-31 – 2012-09-05 (×3): 2.5 mg via RESPIRATORY_TRACT
  Filled 2012-08-31 (×5): qty 0.5

## 2012-08-31 MED ORDER — POTASSIUM CHLORIDE 20 MEQ/15ML (10%) PO LIQD
20.0000 meq | Freq: Two times a day (BID) | ORAL | Status: DC
  Administered 2012-08-31 – 2012-09-01 (×2): 20 meq via ORAL
  Filled 2012-08-31 (×3): qty 30

## 2012-08-31 MED ORDER — BIOTENE DRY MOUTH MT LIQD
15.0000 mL | Freq: Two times a day (BID) | OROMUCOSAL | Status: DC
  Administered 2012-09-01 – 2012-09-08 (×16): 15 mL via OROMUCOSAL

## 2012-08-31 MED ORDER — ENALAPRIL MALEATE 5 MG PO TABS
2.5000 mg | ORAL_TABLET | Freq: Every day | ORAL | Status: DC
  Administered 2012-08-31 – 2012-09-01 (×2): 2.5 mg via ORAL
  Filled 2012-08-31 (×2): qty 1

## 2012-08-31 MED ORDER — TORSEMIDE 20 MG PO TABS
50.0000 mg | ORAL_TABLET | Freq: Two times a day (BID) | ORAL | Status: DC
  Administered 2012-08-31 – 2012-09-01 (×3): 50 mg via ORAL
  Filled 2012-08-31 (×3): qty 3

## 2012-08-31 MED ORDER — INSULIN ASPART 100 UNIT/ML ~~LOC~~ SOLN
6.0000 [IU] | Freq: Three times a day (TID) | SUBCUTANEOUS | Status: DC
  Administered 2012-09-06 – 2012-09-08 (×5): 6 [IU] via SUBCUTANEOUS

## 2012-08-31 MED ORDER — INSULIN ASPART 100 UNIT/ML ~~LOC~~ SOLN
0.0000 [IU] | Freq: Three times a day (TID) | SUBCUTANEOUS | Status: DC
  Administered 2012-09-01: 3 [IU] via SUBCUTANEOUS
  Administered 2012-09-01 (×2): 4 [IU] via SUBCUTANEOUS
  Administered 2012-09-02 (×2): 3 [IU] via SUBCUTANEOUS
  Administered 2012-09-05: 2 [IU] via SUBCUTANEOUS
  Administered 2012-09-06 – 2012-09-08 (×4): 3 [IU] via SUBCUTANEOUS

## 2012-08-31 MED ORDER — DOCUSATE SODIUM 100 MG PO CAPS
100.0000 mg | ORAL_CAPSULE | Freq: Two times a day (BID) | ORAL | Status: DC
  Administered 2012-08-31 – 2012-09-09 (×17): 100 mg via ORAL
  Filled 2012-08-31 (×17): qty 1

## 2012-08-31 MED ORDER — HYDROMORPHONE HCL PF 1 MG/ML IJ SOLN
1.0000 mg | INTRAMUSCULAR | Status: DC | PRN
  Administered 2012-08-31 – 2012-09-08 (×16): 1 mg via INTRAVENOUS
  Filled 2012-08-31: qty 6
  Filled 2012-08-31: qty 1
  Filled 2012-08-31: qty 6
  Filled 2012-08-31 (×3): qty 1
  Filled 2012-08-31 (×4): qty 6
  Filled 2012-08-31 (×3): qty 1
  Filled 2012-08-31: qty 3
  Filled 2012-08-31: qty 1
  Filled 2012-08-31 (×2): qty 6
  Filled 2012-08-31: qty 1
  Filled 2012-08-31: qty 6
  Filled 2012-08-31 (×2): qty 1
  Filled 2012-08-31: qty 6
  Filled 2012-08-31 (×4): qty 1

## 2012-08-31 MED ORDER — ACETAMINOPHEN 650 MG RE SUPP
650.0000 mg | Freq: Four times a day (QID) | RECTAL | Status: DC | PRN
  Administered 2012-08-31: 650 mg via RECTAL
  Filled 2012-08-31: qty 1

## 2012-08-31 MED ORDER — ENOXAPARIN SODIUM 40 MG/0.4ML ~~LOC~~ SOLN: 40.0000 mg | SUBCUTANEOUS | Status: DC

## 2012-08-31 MED ORDER — ALUM & MAG HYDROXIDE-SIMETH 200-200-20 MG/5ML PO SUSP
30.0000 mL | Freq: Four times a day (QID) | ORAL | Status: DC | PRN
  Administered 2012-09-03 – 2012-09-04 (×2): 30 mL via ORAL
  Filled 2012-08-31 (×2): qty 30

## 2012-08-31 MED ORDER — VANCOMYCIN HCL IN DEXTROSE 1-5 GM/200ML-% IV SOLN
1000.0000 mg | Freq: Once | INTRAVENOUS | Status: DC
  Filled 2012-08-31: qty 200

## 2012-08-31 MED ORDER — ENOXAPARIN SODIUM 100 MG/ML ~~LOC~~ SOLN
90.0000 mg | SUBCUTANEOUS | Status: DC
  Administered 2012-08-31 – 2012-09-08 (×9): 90 mg via SUBCUTANEOUS
  Filled 2012-08-31 (×9): qty 1

## 2012-08-31 MED ORDER — HYDROCODONE-ACETAMINOPHEN 5-325 MG PO TABS
1.0000 | ORAL_TABLET | ORAL | Status: DC | PRN
  Administered 2012-09-06 – 2012-09-08 (×4): 2 via ORAL
  Filled 2012-08-31 (×4): qty 2

## 2012-08-31 NOTE — ED Provider Notes (Signed)
History   This chart was scribed for Maudry Diego, MD by Kathreen Cornfield. The patient was seen in room APA07/[facility]07 and the patient's care was started at 9:44AM    CSN: [id number]  Arrival date & time 08/31/12  0841   First MD Initiated Contact with Patient 08/31/12 0944      Chief Complaint  Patient presents with  . Fever    (Consider location/radiation/quality/duration/timing/severity/associated sxs/prior treatment) Patient is a 46 y.o. female presenting with fever. The history is provided by the patient. No language interpreter was used.  Fever Primary symptoms of the febrile illness include fever and myalgias. Primary symptoms do not include cough, nausea or vomiting. The current episode started today. This is a new problem. The problem has been gradually worsening.  The fever began today. The fever has been gradually worsening since its onset. The maximum temperature recorded prior to her arrival was unknown. The temperature was taken by an oral thermometer.  Myalgias began today. The myalgias have been gradually worsening since their onset. The myalgias are present in the legs. The myalgias are aching. The discomfort from the myalgias is moderate. The myalgias are associated with tenderness and swelling. The myalgias are not associated with weakness.    PCP is Dr. [name].   Past Medical History  Diagnosis Date  . Diabetes mellitus   . Chronic pain   . Lymph edema   . Cellulitis   . Chiari malformation     s/p surgery  . S/P colonoscopy 05/26/2007    Dr. Laural Golden sigmoid diverticulosis random biopsies benign  . CAD (coronary artery disease)   . Hypercholesterolemia   . Hypertension   . Asthma   . S/P endoscopy 05/01/2009    Dr. Penelope Coop pill-induced esophageal ulcerations distal to midesophagus, 2 small ulcers in the antrum of the stomach    Past Surgical History  Procedure Date  . Cesarean section      x 2  . Abdominal hysterectomy   . Tonsillectomy   . Adenoidectomy     . Thyroidectomy, partial   . Craniectomy suboccipital w/ cervical laminectomy / chiari   . Portacath placement 07/05/2012    Procedure: INSERTION PORT-A-CATH;  Surgeon: Donato Heinz, MD;  Location: [facility];  Service: General;  Laterality: Left;  subclavian    Family History  Problem Relation Age of Onset  . Colon cancer Mother 49  . Stroke Mother 24  . Coronary artery disease Mother     History  Substance Use Topics  . Smoking status: Never Smoker   . Smokeless tobacco: Never Used  . Alcohol Use: No    OB History    Grav Para Term Preterm Abortions TAB SAB Ect Mult Living                  Review of Systems  Constitutional: Positive for fever.  Respiratory: Negative for cough.   Gastrointestinal: Negative for nausea and vomiting.  Musculoskeletal: Positive for myalgias.  Neurological: Negative for weakness.  All other systems reviewed and are negative.    Allergies  Vancomycin; Contrast media; Pineapple; Pneumococcal vaccines; and Shellfish allergy  Home Medications   Current Outpatient Rx  Name Route Sig Dispense Refill  . ACETAMINOPHEN 325 MG PO TABS Oral Take 650 mg by mouth every 6 (six) hours as needed. fever    . AMLODIPINE BESYLATE 10 MG PO TABS Oral Take 1 tablet (10 mg total) by mouth daily. 30 tablet 0  . DOCUSATE SODIUM 100 MG PO CAPS  Oral Take 100 mg by mouth 2 (two) times daily as needed. For constipation    . ENALAPRIL MALEATE 2.5 MG PO TABS Oral Take 2.5 mg by mouth daily.    Marland Kitchen HYDROCODONE-ACETAMINOPHEN 10-325 MG PO TABS Oral Take 1 tablet by mouth 4 (four) times daily as needed. For pain    . INSULIN ISOPHANE & REGULAR (70-30) 100 UNIT/ML [place] SUSP Subcutaneous Inject 60 Units into the skin 2 (two) times daily.    Marland Kitchen METOPROLOL TARTRATE 50 MG PO TABS Oral Take 1 tablet (50 mg total) by mouth 2 (two) times daily. 60 tablet 0  . ONDANSETRON HCL 8 MG PO TABS Oral Take 8 mg by mouth 4 (four) times daily as needed. For nausea    . POTASSIUM CHLORIDE 20  MEQ/15ML (10%) PO LIQD Oral Take 20 mEq by mouth 2 (two) times daily.     . TORSEMIDE 100 MG PO TABS Oral Take 50 mg by mouth 2 (two) times daily.       BP 146/77  Pulse 95  Temp 99.9 F (37.7 C) (Oral)  Resp 23  Ht _0  (1.651 m)  Wt 375 lb (170.099 kg)  BMI 62.40 kg/m2  SpO2 89%  Physical Exam  Nursing note and vitals reviewed. Constitutional: She is oriented to person, place, and time. She appears well-developed.  HENT:  Head: Normocephalic and atraumatic.  Eyes: Conjunctivae normal and EOM are normal. No scleral icterus.  Neck: Neck supple. No thyromegaly present.  Cardiovascular: Normal rate and regular rhythm.  Exam reveals no gallop and no friction rub.   No murmur heard. Pulmonary/Chest: No stridor. She has no wheezes. She has no rales. She exhibits no tenderness.  Abdominal: She exhibits no distension. There is no tenderness. There is no rebound.  Musculoskeletal: Normal range of motion. She exhibits edema and tenderness.       Severe edema located at the legs bilaterally and moderate tenderness and erythema located at the right thigh.   Lymphadenopathy:    She has no cervical adenopathy.  Neurological: She is oriented to person, place, and time. Coordination normal.  Skin: No rash noted. No erythema.  Psychiatric: She has a normal mood and affect. Her behavior is normal.    ED Course  Procedures (including critical care time)  DIAGNOSTIC STUDIES: Oxygen Saturation is 89% on [place], low by my interpretation.    COORDINATION OF CARE:    9:49AM- Pain management and follow up with Dr. [name] discussed. Pt agrees with treatment.   Results for orders placed during the hospital encounter of 08/31/12  CBC WITH DIFFERENTIAL      Component Value Range   WBC 8.3  4.0 - 10.5 K/uL   RBC 3.74 (*) 3.87 - 5.11 MIL/uL   Hemoglobin 10.2 (*) 12.0 - 15.0 g/dL   HCT 32.3 (*) 36.0 - 46.0 %   MCV 86.4  78.0 - 100.0 fL   MCH 27.3  26.0 - 34.0 pg   MCHC 31.6  30.0 - 36.0 g/dL    RDW 15.8 (*) 11.5 - 15.5 %   Platelets 304  150 - 400 K/uL   Neutrophils Relative 62  43 - 77 %   Neutro Abs 5.1  1.7 - 7.7 K/uL   Lymphocytes Relative 27  12 - 46 %   Lymphs Abs 2.2  0.7 - 4.0 K/uL   Monocytes Relative 6  3 - 12 %   Monocytes Absolute 0.5  0.1 - 1.0 K/uL   Eosinophils Relative 5  0 - 5 %   Eosinophils Absolute 0.4  0.0 - 0.7 K/uL   Basophils Relative 1  0 - 1 %   Basophils Absolute 0.0  0.0 - 0.1 K/uL  COMPREHENSIVE METABOLIC PANEL      Component Value Range   Sodium 133 (*) 135 - 145 mEq/L   Potassium 4.8  3.5 - 5.1 mEq/L   Chloride 96  96 - 112 mEq/L   CO2 31  19 - 32 mEq/L   Glucose, Bld 193 (*) 70 - 99 mg/dL   BUN 23  6 - 23 mg/dL   Creatinine, Ser 1.47 (*) 0.50 - 1.10 mg/dL   Calcium 9.1  8.4 - 10.5 mg/dL   Total Protein 7.6  6.0 - 8.3 g/dL   Albumin 3.1 (*) 3.5 - 5.2 g/dL   AST 14  0 - 37 U/L   ALT 12  0 - 35 U/L   Alkaline Phosphatase 92  39 - 117 U/L   Total Bilirubin 0.2 (*) 0.3 - 1.2 mg/dL   GFR calc non Af Amer 42 (*) >90 mL/min   GFR calc Af Amer 48 (*) >90 mL/min  URINALYSIS, ROUTINE W REFLEX MICROSCOPIC      Component Value Range   Color, Urine YELLOW  YELLOW   APPearance CLEAR  CLEAR   Specific Gravity, Urine 1.015  1.005 - 1.030   pH 7.0  5.0 - 8.0   Glucose, UA NEGATIVE  NEGATIVE mg/dL   Hgb urine dipstick NEGATIVE  NEGATIVE   Bilirubin Urine NEGATIVE  NEGATIVE   Ketones, ur NEGATIVE  NEGATIVE mg/dL   Protein, ur NEGATIVE  NEGATIVE mg/dL   Urobilinogen, UA 0.2  0.0 - 1.0 mg/dL   Nitrite NEGATIVE  NEGATIVE   Leukocytes, UA NEGATIVE  NEGATIVE   Dg Chest Port 1 View  08/31/2012  *RADIOLOGY REPORT*  Clinical Data: Fever  PORTABLE CHEST - 1 VIEW  Comparison: 07/05/2012  Findings: Study is limited by patient's large body habitus. Cardiomegaly again noted.  No acute infiltrate or pulmonary edema. Mild basilar atelectasis.  Lung bases partially obscured by overlying chest wall tissue.  Stable left subclavian Port-A-Cath position with tip in  SVC.  IMPRESSION: Limited study by patient's large body habitus.  Cardiomegaly again noted.  No acute infiltrate or pulmonary edema.  Mild basilar atelectasis.   Original Report Authenticated By: Lahoma Crocker, M.D.       No diagnosis found.    MDM     The chart was scribed for me under my direct supervision.  I personally performed the history, physical, and medical decision making and all procedures in the evaluation of this patient.Maudry Diego, MD 08/31/12 1356

## 2012-08-31 NOTE — ED Notes (Signed)
Awaiting for bariatric bed to be placed in patient's admission room before patient will be transported upstairs.

## 2012-08-31 NOTE — Progress Notes (Signed)
Teflaro dose reviewed for renal function; doses ordered appropriate for current renal function.  Pharmacy will sign off.  Thank you. Netta Cedars, PharmD, BCPS 08/31/2012_0 :01 PM

## 2012-08-31 NOTE — ED Notes (Signed)
Pt woke this am, with fever and body aches, reports h/o cellulitis and thinks may be the same.

## 2012-08-31 NOTE — Progress Notes (Addendum)
At 2000, the nurse tech came and got the nurse. The patient was not responding to any questions, she was shaking. Her oxygen saturation was in the 70's on 3L and her heart rate was up in the 120's. Patient's temp was also up at 101.2. Patient's blood sugar was checked and it was 172. Respiratory had been called and patient was put on a nonrebreather and on telemetry. Patient became alert and oriented. Dr. Legrand Rams was called and new orders were given for an ABG, chest xray, blood cultures and neb treatments every 4 hours PRN. Tylenol supp was given to bring fever down. Results came back and the pCO2 was 79.1 and pO2 was 261, so the nonrebreather was taken off and 6L [place] was put on keeping the patient's oxygen sat at 90-91%. Chest xray came back as well and all results were called to Dr. Legrand Rams. He then gave orders to get a repeat ABG in the morning and transfer patient to the ICU. At 2110, RN gave report to ICU nurse and patient was bought up to the ICU at 2120.

## 2012-09-01 ENCOUNTER — Inpatient Hospital Stay (HOSPITAL_COMMUNITY): Payer: Medicaid Other

## 2012-09-01 LAB — CBC
Hemoglobin: 10.1 g/dL — ABNORMAL LOW (ref 12.0–15.0)
MCH: 27.1 pg (ref 26.0–34.0)
MCHC: 30.7 g/dL (ref 30.0–36.0)
MCV: 88.2 fL (ref 78.0–100.0)
RBC: 3.73 MIL/uL — ABNORMAL LOW (ref 3.87–5.11)

## 2012-09-01 LAB — BLOOD GAS, ARTERIAL
FIO2: 50 %
pCO2 arterial: 64.5 mmHg (ref 35.0–45.0)
pH, Arterial: 7.288 — ABNORMAL LOW (ref 7.350–7.450)
pO2, Arterial: 50.9 mmHg — ABNORMAL LOW (ref 80.0–100.0)

## 2012-09-01 LAB — BASIC METABOLIC PANEL
BUN: 25 mg/dL — ABNORMAL HIGH (ref 6–23)
CO2: 28 mEq/L (ref 19–32)
Calcium: 8.8 mg/dL (ref 8.4–10.5)
Glucose, Bld: 215 mg/dL — ABNORMAL HIGH (ref 70–99)
Potassium: 5.8 mEq/L — ABNORMAL HIGH (ref 3.5–5.1)
Sodium: 134 mEq/L — ABNORMAL LOW (ref 135–145)

## 2012-09-01 LAB — GLUCOSE, CAPILLARY: Glucose-Capillary: 182 mg/dL — ABNORMAL HIGH (ref 70–99)

## 2012-09-01 MED ORDER — SODIUM CHLORIDE 0.9 % IJ SOLN
INTRAMUSCULAR | Status: AC
  Administered 2012-09-01: 10 mL
  Filled 2012-09-01: qty 3

## 2012-09-01 MED ORDER — PROMETHAZINE HCL 25 MG/ML IJ SOLN
12.5000 mg | INTRAMUSCULAR | Status: DC | PRN
  Administered 2012-09-01 – 2012-09-07 (×17): 12.5 mg via INTRAVENOUS
  Filled 2012-09-01 (×19): qty 1

## 2012-09-01 MED ORDER — ONDANSETRON HCL 4 MG PO TABS
4.0000 mg | ORAL_TABLET | ORAL | Status: DC | PRN
  Administered 2012-09-09: 4 mg via ORAL
  Filled 2012-09-01 (×2): qty 1

## 2012-09-01 MED ORDER — ONDANSETRON HCL 4 MG/2ML IJ SOLN
4.0000 mg | INTRAMUSCULAR | Status: DC | PRN
  Administered 2012-09-01 – 2012-09-08 (×15): 4 mg via INTRAVENOUS
  Filled 2012-09-01 (×15): qty 2

## 2012-09-01 NOTE — Progress Notes (Signed)
I was called by the nursing staff; the patient has decreased urine output; labs reviewed; am labs with increased creatinine and K. K supplementation stopped. The patient also complaining of leg cramps.   We will repeat CMP, obtain magnesium and phosphorus. For now I will discontinue the enalapril since could contribute to hyperkaliemia.

## 2012-09-01 NOTE — Progress Notes (Signed)
CRITICAL VALUE ALERT  Critical value received:  ABG  ph 7.28  pCO2 64.5  pO2 50.9  Bicarb. 29.9  O2 sat 81%  Date of notification:  09/01/12   Time of notification:  1045am  Critical value read back: yes  Nurse who received alert:  Bascom Levels, RN     MD notified (1st page):  Dr. [name] (called his office)  Time of first page:  1050am  MD notified (2nd page): n/a  Time of second page: n/a  Responding MD:  Dr. [name]  Time MD responded:  1050am  New order received to place pt on Bipap.  RT made aware.

## 2012-09-01 NOTE — Plan of Care (Signed)
Problem: Consults Goal: General Medical Patient Education See Patient Education Module for specific education.  Patient transferred to ICU after episode of Resp. Distress overnight. Goal: Skin Care Protocol Initiated - if indicated If consults are not indicated, leave blank or document N/A  Outcome: Progressing Lower leg pain continues Goal: Nutrition Consult-if indicated Outcome: Progressing Nauseated and vomiting incidents during the day.  Resting at present Goal: Diabetes Guidelines if Diabetic/Glucose > 140 If diabetic or lab glucose is > 140 mg/dl - Initiate Diabetes/Hyperglycemia Guidelines & Document Interventions  Outcome: Progressing Last Blood sugar 146  Problem: Phase I Progression Outcomes Goal: Pain controlled with appropriate interventions Outcome: Progressing Resting at present.  Last treated for pain at 1430 Goal: OOB as tolerated unless otherwise ordered Outcome: Progressing Up to bsc with assistance. Up in chair for 2 hrs. today Goal: Initial discharge plan identified Outcome: Progressing Lives at home with wife Goal: Hemodynamically stable Outcome: Progressing Hypotensive earlier.  WDL at present

## 2012-09-01 NOTE — Progress Notes (Signed)
D- Pt woke up and needed to void and upon removing her BiPAP mask she became nauseated and vomited a small mount of clear emesis.  A- Patient assisted back to bed after voiding in [facility].  Dr. Legrand Rams called and he increased Zofran to Q 4 hrs prn nausea.  Pt was given a dose of Zofran IVP.  Pt not placed back on BiPAP due to risk of aspiration if she were to vomit with in use.  Pt placed on venturi mask at 50% with oxygen saturation at 92%.  R-Pt in bed with venturi mask in place and is resting comfortably.  All vitals signs are WDL.  Nursing staff to continue to monitor.

## 2012-09-01 NOTE — H&P (Signed)
[name] MRN: [id number] DOB/AGE: 05/07/1966 46 y.o. Primary Care Physician:Nasiya Pascual L, MD Admit date: 08/31/2012 Chief Complaint: cellulitis HPI: She came to the hospital because of leg pain but also was nauseated. She had temperature to 102. She has vomited some. She has multiple medical problems including diabetes chronic pain severe chronic lymphedema recurrent cellulitis hypertension and obesity hypoventilation  Past Medical History  Diagnosis Date  . Diabetes mellitus   . Chronic pain   . Lymph edema   . Cellulitis   . Chiari malformation     s/p surgery  . S/P colonoscopy 05/26/2007    Dr. Laural Golden sigmoid diverticulosis random biopsies benign  . CAD (coronary artery disease)   . Hypercholesterolemia   . Hypertension   . Asthma   . S/P endoscopy 05/01/2009    Dr. Penelope Coop pill-induced esophageal ulcerations distal to midesophagus, 2 small ulcers in the antrum of the stomach   Past Surgical History  Procedure Date  . Cesarean section      x 2  . Abdominal hysterectomy   . Tonsillectomy   . Adenoidectomy   . Thyroidectomy, partial   . Craniectomy suboccipital w/ cervical laminectomy / chiari   . Portacath placement 07/05/2012    Procedure: INSERTION PORT-A-CATH;  Surgeon: Donato Heinz, MD;  Location: [facility];  Service: General;  Laterality: Left;  subclavian        Family History  Problem Relation Age of Onset  . Colon cancer Mother 91  . Stroke Mother 28  . Coronary artery disease Mother     Social History:  reports that she has never smoked. She has never used smokeless tobacco. She reports that she does not drink alcohol or use illicit drugs.   Allergies:  Allergies  Allergen Reactions  . Vancomycin Nausea And Vomiting    Infusion "made me feel like I was dying" had to be readmitted to hospital  . Contrast Media (Iodinated Diagnostic Agents) Hives and Swelling    Dye for cardiac cath. Tongue swells  . Pineapple Itching and Swelling  .  Pneumococcal Vaccines Swelling    Turns skin black, and swelling  . Shellfish Allergy Itching and Swelling    Medications Prior to Admission  Medication Sig Dispense Refill  . acetaminophen (TYLENOL) 325 MG tablet Take 650 mg by mouth every 6 (six) hours as needed. fever      . amLODipine (NORVASC) 10 MG tablet Take 1 tablet (10 mg total) by mouth daily.  30 tablet  0  . docusate sodium (COLACE) 100 MG capsule Take 100 mg by mouth 2 (two) times daily as needed. For constipation      . enalapril (VASOTEC) 2.5 MG tablet Take 2.5 mg by mouth daily.      Marland Kitchen HYDROcodone-acetaminophen (NORCO) 10-325 MG per tablet Take 1 tablet by mouth 4 (four) times daily as needed. For pain      . insulin NPH-insulin regular (NOVOLIN 70/30) (70-30) 100 UNIT/ML injection Inject 60 Units into the skin 2 (two) times daily.      . metoprolol (LOPRESSOR) 50 MG tablet Take 1 tablet (50 mg total) by mouth 2 (two) times daily.  60 tablet  0  . ondansetron (ZOFRAN) 8 MG tablet Take 8 mg by mouth 4 (four) times daily as needed. For nausea      . potassium chloride 20 MEQ/15ML (10%) solution Take 20 mEq by mouth 2 (two) times daily.       Marland Kitchen torsemide (DEMADEX) 100 MG tablet Take 50 mg  by mouth 2 (two) times daily.            ZOX:WRUEA from the symptoms mentioned above,there are no other symptoms referable to all systems reviewed.  Physical Exam: Blood pressure 107/58, pulse 72, temperature 99.3 F (37.4 C), temperature source Oral, resp. rate 16, height 5' 5" (1.651 m), weight 173.6 kg (382 lb 11.5 oz), SpO2 85.00%. She is morbidly obese. She is awake and alert. She is in mild distress. Her pupils are reactive. Her nose and throat clear. Her neck is supple. Her chest is relatively clear. She has some decreased breath sounds. Her heart is regular without gallop. Her abdomen is soft. Extremities showed severe lymphedema in both were hot and erythematous. Central nervous system examination is grossly intact    Basename  09/01/12 0420 08/31/12 0957  WBC 11.8* 8.3  NEUTROABS -- 5.1  HGB 10.1* 10.2*  HCT 32.9* 32.3*  MCV 88.2 86.4  PLT 313 304    Basename 09/01/12 0420 08/31/12 0957  NA 134* 133*  K 5.8* 4.8  CL 98 96  CO2 28 31  GLUCOSE 215* 193*  BUN 25* 23  CREATININE 1.97* 1.47*  CALCIUM 8.8 9.1  MG -- --  lablast2(ast:2,ALT:2,alkphos:2,bilitot:2,prot:2,albumin:2)@    Recent Results (from the past 240 hour(s))  CULTURE, BLOOD (ROUTINE X 2)     Status: Normal (Preliminary result)   Collection Time   08/31/12  8:54 PM      Component Value Range Status Comment   Specimen Description BLOOD LEFT ANTECUBITAL   Final    Special Requests BOTTLES DRAWN AEROBIC AND ANAEROBIC 6CC EACH   Final    Culture NO GROWTH 1 DAY   Final    Report Status PENDING   Incomplete   CULTURE, BLOOD (ROUTINE X 2)     Status: Normal (Preliminary result)   Collection Time   08/31/12  8:54 PM      Component Value Range Status Comment   Specimen Description BLOOD PORTA CATH   Final    Special Requests     Final    Value: BOTTLES DRAWN AEROBIC AND ANAEROBIC AEB 10CC ANA 5CC   Culture NO GROWTH 1 DAY   Final    Report Status PENDING   Incomplete   MRSA PCR SCREENING     Status: Normal   Collection Time   08/31/12  9:03 PM      Component Value Range Status Comment   MRSA by PCR NEGATIVE  NEGATIVE Final      Dg Chest Port 1 View  08/31/2012  *RADIOLOGY REPORT*  Clinical Data: 46 year old female with low oxygenation, fever.  PORTABLE CHEST - 1 VIEW  Comparison: 1013 hours the same day and earlier.  Findings : AP portable upright view 2021 hours.  Stable left chest Port-A- Cath.  Increased coarse bilateral basilar predominant pulmonary opacity, greater on the right. Stable cardiomegaly and mediastinal contours.  Continued low lung volumes.  IMPRESSION: Increased right greater than left coarse pulmonary opacity suspicious for bilateral pneumonia.   Original Report Authenticated By: Randall An, M.D.    Dg Chest Port 1  View  08/31/2012  *RADIOLOGY REPORT*  Clinical Data: Fever  PORTABLE CHEST - 1 VIEW  Comparison: 07/05/2012  Findings: Study is limited by patient's large body habitus. Cardiomegaly again noted.  No acute infiltrate or pulmonary edema. Mild basilar atelectasis.  Lung bases partially obscured by overlying chest wall tissue.  Stable left subclavian Port-A-Cath position with tip in SVC.  IMPRESSION: Limited study by  patient's large body habitus.  Cardiomegaly again noted.  No acute infiltrate or pulmonary edema.  Mild basilar atelectasis.   Original Report Authenticated By: Lahoma Crocker, M.D.    Impression: She has cellulitis of both legs. She has nausea which is a frequent accompaniment of this problem. She has multiple other medical problems including lymphedema, morbid obesity and diabetes Active Problems:  * No active hospital problems. *      Plan: She'll be started on antibiotics. She'll be on oxygen. She will continue with her treatments.      Sten Dematteo L Pager [phone]  09/01/2012, 9:06 AM

## 2012-09-01 NOTE — Progress Notes (Signed)
Per Dr. [name] order, at 1135 an attempt was made to initate  BiPAP . Patient had been vomiting and was still sick on stomach. She was also receiving aroma therapy, BiPAP placement was defrred at this time. RN did assist patient into chair which has helped patient remain alert and increased SPO2 while on 50% VM.

## 2012-09-02 ENCOUNTER — Other Ambulatory Visit: Payer: Self-pay

## 2012-09-02 LAB — COMPREHENSIVE METABOLIC PANEL
ALT: 10 U/L (ref 0–35)
Alkaline Phosphatase: 84 U/L (ref 39–117)
BUN: 36 mg/dL — ABNORMAL HIGH (ref 6–23)
CO2: 26 mEq/L (ref 19–32)
Calcium: 8.7 mg/dL (ref 8.4–10.5)
GFR calc Af Amer: 21 mL/min — ABNORMAL LOW (ref >90)
GFR calc non Af Amer: 18 mL/min — ABNORMAL LOW (ref >90)
Glucose, Bld: 156 mg/dL — ABNORMAL HIGH (ref 70–99)
Potassium: 6 mEq/L — ABNORMAL HIGH (ref 3.5–5.1)
Sodium: 133 mEq/L — ABNORMAL LOW (ref 135–145)

## 2012-09-02 LAB — POTASSIUM: Potassium: 4.7 mEq/L (ref 3.5–5.1)

## 2012-09-02 LAB — GLUCOSE, CAPILLARY
Glucose-Capillary: 126 mg/dL — ABNORMAL HIGH (ref 70–99)
Glucose-Capillary: 103 mg/dL — ABNORMAL HIGH (ref 70–99)
Glucose-Capillary: 105 mg/dL — ABNORMAL HIGH (ref 70–99)

## 2012-09-02 LAB — CBC
HCT: 32 % — ABNORMAL LOW (ref 36.0–46.0)
Hemoglobin: 10.2 g/dL — ABNORMAL LOW (ref 12.0–15.0)
MCH: 27.9 pg (ref 26.0–34.0)
RBC: 3.66 MIL/uL — ABNORMAL LOW (ref 3.87–5.11)

## 2012-09-02 MED ORDER — SODIUM CHLORIDE 0.9 % IJ SOLN
INTRAMUSCULAR | Status: AC
  Administered 2012-09-02: 13:00:00
  Filled 2012-09-02: qty 3

## 2012-09-02 MED ORDER — FUROSEMIDE 10 MG/ML IJ SOLN
60.0000 mg | Freq: Two times a day (BID) | INTRAMUSCULAR | Status: DC
  Administered 2012-09-02 – 2012-09-03 (×4): 60 mg via INTRAVENOUS
  Filled 2012-09-02 (×4): qty 6

## 2012-09-02 MED ORDER — BIOTENE DRY MOUTH MT LIQD
15.0000 mL | Freq: Two times a day (BID) | OROMUCOSAL | Status: DC
  Administered 2012-09-02 – 2012-09-04 (×3): 15 mL via OROMUCOSAL

## 2012-09-02 MED ORDER — MOXIFLOXACIN HCL IN NACL 400 MG/250ML IV SOLN
400.0000 mg | INTRAVENOUS | Status: DC
  Administered 2012-09-02 – 2012-09-04 (×3): 400 mg via INTRAVENOUS
  Filled 2012-09-02 (×4): qty 250

## 2012-09-02 MED ORDER — SODIUM POLYSTYRENE SULFONATE 15 GM/60ML PO SUSP
30.0000 g | Freq: Once | ORAL | Status: AC
  Administered 2012-09-02: 30 g via ORAL
  Filled 2012-09-02: qty 120

## 2012-09-02 MED ORDER — CHLORHEXIDINE GLUCONATE 0.12 % MT SOLN
15.0000 mL | Freq: Two times a day (BID) | OROMUCOSAL | Status: DC
  Administered 2012-09-02 – 2012-09-03 (×4): 15 mL via OROMUCOSAL
  Filled 2012-09-02 (×4): qty 15

## 2012-09-02 MED ORDER — SODIUM CHLORIDE 0.9 % IV BOLUS (SEPSIS)
500.0000 mL | Freq: Once | INTRAVENOUS | Status: AC
  Administered 2012-09-02: 500 mL via INTRAVENOUS

## 2012-09-02 NOTE — Progress Notes (Signed)
Subjective: She was transferred to the intensive care unit last night because of increasing respiratory problems. She is continuing to have nausea and vomiting. Her chest x-ray looks like she may have had some aspiration and that should be covered fairly well by the teflaro. She has right upper quadrant pain. Her laboratory work is pending  Objective: Vital signs in last 24 hours: Temp:  [97.3 F (36.3 C)-99.1 F (37.3 C)] 97.9 F (36.6 C) (09/14 0000) Pulse Rate:  [58-95] 58  (09/14 0800) Resp:  [14-24] 18  (09/14 0800) BP: (96-125)/(51-82) 108/69 mmHg (09/14 0400) SpO2:  [64 %-97 %] 96 % (09/14 0800) Weight:  [176.1 kg (388 lb 3.7 oz)] 176.1 kg (388 lb 3.7 oz) (09/14 0500) Weight change: 6.001 kg (13 lb 3.7 oz) Last BM Date: 09/02/12  Intake/Output from previous day: 09/13 0701 - 09/14 0700 In: 984 [I.V.:480; IV Piggyback:504] Out: 201 [Urine:200; Stool:1]  PHYSICAL EXAM General appearance: alert, cooperative and moderate distress Resp: rhonchi bilaterally Cardio: regular rate and rhythm, S1, S2 normal, no murmur, click, rub or gallop GI: Mild right upper quadrant tenderness Extremities: extremities normal, atraumatic, no cyanosis or edema  Lab Results:    Basic Metabolic Panel:  Basename 09/02/12 0406 09/01/12 2352 09/01/12 0420  NA -- 133* 134*  K 5.1 6.0* --  CL -- 97 98  CO2 -- 26 28  GLUCOSE -- 156* 215*  BUN -- 36* 25*  CREATININE -- 2.99* 1.97*  CALCIUM -- 8.7 8.8  MG -- 2.1 --  PHOS -- 5.4* --   Liver Function Tests:  [facility] 09/01/12 2352 08/31/12 0957  AST 12 14  ALT 10 12  ALKPHOS 84 92  BILITOT 0.5 0.2*  PROT 7.5 7.6  ALBUMIN 3.0* 3.1*    Basename 09/01/12 2352  LIPASE 24  AMYLASE --   No results found for this basename: AMMONIA:2 in the last 72 hours CBC:  Basename 09/01/12 2352 09/01/12 0420 08/31/12 0957  WBC 8.1 11.8* --  NEUTROABS -- -- 5.1  HGB 10.2* 10.1* --  HCT 32.0* 32.9* --  MCV 87.4 88.2 --  PLT 283 313 --   Cardiac  Enzymes: No results found for this basename: CKTOTAL:3,CKMB:3,CKMBINDEX:3,TROPONINI:3 in the last 72 hours BNP: No results found for this basename: PROBNP:3 in the last 72 hours D-Dimer: No results found for this basename: DDIMER:2 in the last 72 hours CBG:  Basename 09/02/12 0719 09/01/12 2126 09/01/12 1628 09/01/12 1209 09/01/12 0738 08/31/12 2146  GLUCAP 136* 158* 146* 170* 182* 220*   Hemoglobin A1C: No results found for this basename: HGBA1C in the last 72 hours Fasting Lipid Panel: No results found for this basename: CHOL,HDL,LDLCALC,TRIG,CHOLHDL,LDLDIRECT in the last 72 hours Thyroid Function Tests: No results found for this basename: TSH,T4TOTAL,FREET4,T3FREE,THYROIDAB in the last 72 hours Anemia Panel: No results found for this basename: VITAMINB12,FOLATE,FERRITIN,TIBC,IRON,RETICCTPCT in the last 72 hours Coagulation: No results found for this basename: LABPROT:2,INR:2 in the last 72 hours Urine Drug Screen: Drugs of Abuse  No results found for this basename: labopia, cocainscrnur, labbenz, amphetmu, thcu, labbarb    Alcohol Level: No results found for this basename: ETH:2 in the last 72 hours Urinalysis:  Basename 08/31/12 1144  COLORURINE YELLOW  LABSPEC 1.015  PHURINE 7.0  GLUCOSEU NEGATIVE  HGBUR NEGATIVE  BILIRUBINUR NEGATIVE  KETONESUR NEGATIVE  PROTEINUR NEGATIVE  UROBILINOGEN 0.2  NITRITE NEGATIVE  LEUKOCYTESUR NEGATIVE   Misc. Labs:  ABGS  Basename 09/01/12 1035  PHART 7.288*  PO2ART 50.9*  TCO2 28.4  HCO3 29.9*  CULTURES Recent Results (from the past 240 hour(s))  CULTURE, BLOOD (ROUTINE X 2)     Status: Normal (Preliminary result)   Collection Time   08/31/12  8:54 PM      Component Value Range Status Comment   Specimen Description BLOOD LEFT ANTECUBITAL   Final    Special Requests BOTTLES DRAWN AEROBIC AND ANAEROBIC 6CC EACH   Final    Culture NO GROWTH 1 DAY   Final    Report Status PENDING   Incomplete   CULTURE, BLOOD (ROUTINE X 2)      Status: Normal (Preliminary result)   Collection Time   08/31/12  8:54 PM      Component Value Range Status Comment   Specimen Description BLOOD PORTA CATH   Final    Special Requests     Final    Value: BOTTLES DRAWN AEROBIC AND ANAEROBIC AEB 10CC ANA 5CC   Culture NO GROWTH 1 DAY   Final    Report Status PENDING   Incomplete   MRSA PCR SCREENING     Status: Normal   Collection Time   08/31/12  9:03 PM      Component Value Range Status Comment   MRSA by PCR NEGATIVE  NEGATIVE Final    Studies/Results: Dg Chest Port 1 View  08/31/2012  *RADIOLOGY REPORT*  Clinical Data: 46 year old female with low oxygenation, fever.  PORTABLE CHEST - 1 VIEW  Comparison: 1013 hours the same day and earlier.  Findings : AP portable upright view 2021 hours.  Stable left chest Port-A- Cath.  Increased coarse bilateral basilar predominant pulmonary opacity, greater on the right. Stable cardiomegaly and mediastinal contours.  Continued low lung volumes.  IMPRESSION: Increased right greater than left coarse pulmonary opacity suspicious for bilateral pneumonia.   Original Report Authenticated By: Randall An, M.D.    Dg Chest Port 1 View  08/31/2012  *RADIOLOGY REPORT*  Clinical Data: Fever  PORTABLE CHEST - 1 VIEW  Comparison: 07/05/2012  Findings: Study is limited by patient's large body habitus. Cardiomegaly again noted.  No acute infiltrate or pulmonary edema. Mild basilar atelectasis.  Lung bases partially obscured by overlying chest wall tissue.  Stable left subclavian Port-A-Cath position with tip in SVC.  IMPRESSION: Limited study by patient's large body habitus.  Cardiomegaly again noted.  No acute infiltrate or pulmonary edema.  Mild basilar atelectasis.   Original Report Authenticated By: Lahoma Crocker, M.D.    US Abdomen Limited Ruq  09/01/2012  *RADIOLOGY REPORT*  Clinical Data: Nausea.  Evaluate gallbladder  LIMITED ABDOMINAL ULTRASOUND  Comparison:  None.  Findings: The gallbladder appears well  distended.  Some internal specular echoes are identified suspicious for a small amount of sludge.  No cholelithiasis is evident.  No gallbladder wall thickening or pericholecystic fluid is seen.  The common bile duct measures 2.9 mm and has a normal appearance.  The liver is homogeneous in echotexture with no focal parenchymal abnormality noted.  The right lobe of the liver measures 22 cm in length and this may be due to the presence of a Reidel's lobe or indicate some hepatic enlargement.  No signs of intrahepatic ductal dilatation are seen.  No right upper quadrant fluid is noted.  Limited views of the right kidney shows no signs of hydronephrosis. Limited views of the pancreatic head and body appear normal in size and echotexture  IMPRESSION: Findings suggestive of a small amount of gallbladder sludge with no evidence for cholelithiasis or cholecystitis.  Prominent right lobe  of the liver question hepatic enlargement or Reidel's lobe.   Original Report Authenticated By: Ander Gaster, M.D.     Medications:  Scheduled:   . amLODipine  10 mg Oral Daily  . antiseptic oral rinse  15 mL Mouth Rinse BID  . antiseptic oral rinse  15 mL Mouth Rinse q12n4p  . ceFTAROline (TEFLARO) IV  600 mg Intravenous Q12H  . chlorhexidine  15 mL Mouth Rinse BID  . docusate sodium  100 mg Oral BID  . enoxaparin (LOVENOX) injection  90 mg Subcutaneous Q24H  . insulin aspart  0-20 Units Subcutaneous TID WC  . insulin aspart  0-5 Units Subcutaneous QHS  . insulin aspart  6 Units Subcutaneous TID WC  . insulin glargine  30 Units Subcutaneous QHS  . metoprolol  50 mg Oral BID  . sodium chloride  500 mL Intravenous Once  . sodium chloride      . sodium chloride      . sodium polystyrene  30 g Oral Once  . DISCONTD: enalapril  2.5 mg Oral Daily  . DISCONTD: potassium chloride  20 mEq Oral BID  . DISCONTD: torsemide  50 mg Oral BID   Continuous:   . sodium chloride 20 mL/hr at 09/02/12 0800    CLE:[id number], acetaminophen, albuterol, alum & mag hydroxide-simeth, HYDROcodone-acetaminophen, HYDROmorphone (DILAUDID) injection, ondansetron (ZOFRAN) IV, ondansetron, promethazine  Assesment: She has cellulitis of the legs. She may have aspirated. She has obesity hypoventilation. She has mild chronic renal failure and BMET pending. Active Problems:  * No active hospital problems. *     Plan: She will have ultrasound of the abdomen on going to add Phenergan for her nausea continue with all of her other treatments including BiPAP if needed. BiPAP is somewhat problemlematic because of her vomiting    LOS: 2 days   Talyia Allende L 09/02/2012, 8:44 AM

## 2012-09-02 NOTE — Progress Notes (Signed)
Hyperkalemia   Protocol for hyperkalemia initiated.

## 2012-09-02 NOTE — Consult Note (Signed)
Reason for Consult: Acute on chronic kidney disease Referring Physician: [name] is an 46 y.o. female.  HPI: Her she is a patient who has history of her chronic renal failure stage III presently had came with complaints of her nausea and also vomiting. She was found to have hyperkalemia and worsening of renal failure. According to the patient she was told about a year ago she has chronic renal failure and thought to be secondary to  Diabetes  versus hypertension and also  do to medications. Since her last discharge patient was having continuous nausea on and off. Presently also she has diarrhea. Presently she complains of weakness and also some difficulty breathing. According to the patient she has been on home oxygen since her last discharge. Hence difficulty breathing is not worse than before.  Past Medical History  Diagnosis Date  . Diabetes mellitus   . Chronic pain   . Lymph edema   . Cellulitis   . Chiari malformation     s/p surgery  . S/P colonoscopy 05/26/2007    Dr. Laural Golden sigmoid diverticulosis random biopsies benign  . CAD (coronary artery disease)   . Hypercholesterolemia   . Hypertension   . Asthma   . S/P endoscopy 05/01/2009    Dr. Penelope Coop pill-induced esophageal ulcerations distal to midesophagus, 2 small ulcers in the antrum of the stomach    Past Surgical History  Procedure Date  . Cesarean section      x 2  . Abdominal hysterectomy   . Tonsillectomy   . Adenoidectomy   . Thyroidectomy, partial   . Craniectomy suboccipital w/ cervical laminectomy / chiari   . Portacath placement 07/05/2012    Procedure: INSERTION PORT-A-CATH;  Surgeon: Donato Heinz, MD;  Location: [facility];  Service: General;  Laterality: Left;  subclavian    Family History  Problem Relation Age of Onset  . Colon cancer Mother 14  . Stroke Mother 16  . Coronary artery disease Mother     Social History:  reports that she has never smoked. She has never used smokeless  tobacco. She reports that she does not drink alcohol or use illicit drugs.  Allergies:  Allergies  Allergen Reactions  . Vancomycin Nausea And Vomiting    Infusion "made me feel like I was dying" had to be readmitted to hospital  . Contrast Media (Iodinated Diagnostic Agents) Hives and Swelling    Dye for cardiac cath. Tongue swells  . Pineapple Itching and Swelling  . Pneumococcal Vaccines Swelling    Turns skin black, and swelling  . Shellfish Allergy Itching and Swelling    Medications: I have reviewed the patient's current medications.  Results for orders placed during the hospital encounter of 08/31/12 (from the past 48 hour(s))  CBC WITH DIFFERENTIAL     Status: Abnormal   Collection Time   08/31/12  9:57 AM      Component Value Range Comment   WBC 8.3  4.0 - 10.5 K/uL    RBC 3.74 (*) 3.87 - 5.11 MIL/uL    Hemoglobin 10.2 (*) 12.0 - 15.0 g/dL    HCT 32.3 (*) 36.0 - 46.0 %    MCV 86.4  78.0 - 100.0 fL    MCH 27.3  26.0 - 34.0 pg    MCHC 31.6  30.0 - 36.0 g/dL    RDW 15.8 (*) 11.5 - 15.5 %    Platelets 304  150 - 400 K/uL    Neutrophils Relative 62  43 - 77 %    Neutro Abs 5.1  1.7 - 7.7 K/uL    Lymphocytes Relative 27  12 - 46 %    Lymphs Abs 2.2  0.7 - 4.0 K/uL    Monocytes Relative 6  3 - 12 %    Monocytes Absolute 0.5  0.1 - 1.0 K/uL    Eosinophils Relative 5  0 - 5 %    Eosinophils Absolute 0.4  0.0 - 0.7 K/uL    Basophils Relative 1  0 - 1 %    Basophils Absolute 0.0  0.0 - 0.1 K/uL   COMPREHENSIVE METABOLIC PANEL     Status: Abnormal   Collection Time   08/31/12  9:57 AM      Component Value Range Comment   Sodium 133 (*) 135 - 145 mEq/L    Potassium 4.8  3.5 - 5.1 mEq/L    Chloride 96  96 - 112 mEq/L    CO2 31  19 - 32 mEq/L    Glucose, Bld 193 (*) 70 - 99 mg/dL    BUN 23  6 - 23 mg/dL    Creatinine, Ser 1.47 (*) 0.50 - 1.10 mg/dL    Calcium 9.1  8.4 - 10.5 mg/dL    Total Protein 7.6  6.0 - 8.3 g/dL    Albumin 3.1 (*) 3.5 - 5.2 g/dL    AST 14  0 - 37  U/L    ALT 12  0 - 35 U/L    Alkaline Phosphatase 92  39 - 117 U/L    Total Bilirubin 0.2 (*) 0.3 - 1.2 mg/dL    GFR calc non Af Amer 42 (*) >90 mL/min    GFR calc Af Amer 48 (*) >90 mL/min   URINALYSIS, ROUTINE W REFLEX MICROSCOPIC     Status: Normal   Collection Time   08/31/12 11:44 AM      Component Value Range Comment   Color, Urine YELLOW  YELLOW    APPearance CLEAR  CLEAR    Specific Gravity, Urine 1.015  1.005 - 1.030    pH 7.0  5.0 - 8.0    Glucose, UA NEGATIVE  NEGATIVE mg/dL    Hgb urine dipstick NEGATIVE  NEGATIVE    Bilirubin Urine NEGATIVE  NEGATIVE    Ketones, ur NEGATIVE  NEGATIVE mg/dL    Protein, ur NEGATIVE  NEGATIVE mg/dL    Urobilinogen, UA 0.2  0.0 - 1.0 mg/dL    Nitrite NEGATIVE  NEGATIVE    Leukocytes, UA NEGATIVE  NEGATIVE MICROSCOPIC NOT DONE ON URINES WITH NEGATIVE PROTEIN, BLOOD, LEUKOCYTES, NITRITE, OR GLUCOSE <1000 mg/dL.  GLUCOSE, CAPILLARY     Status: Abnormal   Collection Time   08/31/12  7:56 PM      Component Value Range Comment   Glucose-Capillary 182 (*) 70 - 99 mg/dL    Comment 1 Notify RN     BLOOD GAS, ARTERIAL     Status: Abnormal   Collection Time   08/31/12  8:20 PM      Component Value Range Comment   FIO2 100.00      Delivery systems NON-REBREATHER OXYGEN MASK      pH, Arterial 7.218 (*) 7.350 - 7.450    pCO2 arterial 79.1 (*) 35.0 - 45.0 mmHg    pO2, Arterial 261.0 (*) 80.0 - 100.0 mmHg    Bicarbonate 31.1 (*) 20.0 - 24.0 mEq/L    TCO2 29.8  0 - 100 mmol/L    Acid-Base Excess  3.8 (*) 0.0 - 2.0 mmol/L    O2 Saturation 99.5      Patient temperature 37.0      Collection site RIGHT RADIAL      Drawn by [id number]      Sample type ARTERIAL      Allens test (pass/fail) PASS  PASS   CULTURE, BLOOD (ROUTINE X 2)     Status: Normal (Preliminary result)   Collection Time   08/31/12  8:54 PM      Component Value Range Comment   Specimen Description BLOOD LEFT ANTECUBITAL      Special Requests BOTTLES DRAWN AEROBIC AND ANAEROBIC 6CC EACH        Culture NO GROWTH 1 DAY      Report Status PENDING     CULTURE, BLOOD (ROUTINE X 2)     Status: Normal (Preliminary result)   Collection Time   08/31/12  8:54 PM      Component Value Range Comment   Specimen Description BLOOD PORTA CATH      Special Requests        Value: BOTTLES DRAWN AEROBIC AND ANAEROBIC AEB 10CC ANA 5CC   Culture NO GROWTH 1 DAY      Report Status PENDING     MRSA PCR SCREENING     Status: Normal   Collection Time   08/31/12  9:03 PM      Component Value Range Comment   MRSA by PCR NEGATIVE  NEGATIVE   GLUCOSE, CAPILLARY     Status: Abnormal   Collection Time   08/31/12  9:46 PM      Component Value Range Comment   Glucose-Capillary 220 (*) 70 - 99 mg/dL    Comment 1 Documented in Chart      Comment 2 Notify RN     BASIC METABOLIC PANEL     Status: Abnormal   Collection Time   09/01/12  4:20 AM      Component Value Range Comment   Sodium 134 (*) 135 - 145 mEq/L    Potassium 5.8 (*) 3.5 - 5.1 mEq/L DELTA CHECK NOTED   Chloride 98  96 - 112 mEq/L    CO2 28  19 - 32 mEq/L    Glucose, Bld 215 (*) 70 - 99 mg/dL    BUN 25 (*) 6 - 23 mg/dL    Creatinine, Ser 1.97 (*) 0.50 - 1.10 mg/dL    Calcium 8.8  8.4 - 10.5 mg/dL    GFR calc non Af Amer 29 (*) >90 mL/min    GFR calc Af Amer 34 (*) >90 mL/min   CBC     Status: Abnormal   Collection Time   09/01/12  4:20 AM      Component Value Range Comment   WBC 11.8 (*) 4.0 - 10.5 K/uL    RBC 3.73 (*) 3.87 - 5.11 MIL/uL    Hemoglobin 10.1 (*) 12.0 - 15.0 g/dL    HCT 32.9 (*) 36.0 - 46.0 %    MCV 88.2  78.0 - 100.0 fL    MCH 27.1  26.0 - 34.0 pg    MCHC 30.7  30.0 - 36.0 g/dL    RDW 15.9 (*) 11.5 - 15.5 %    Platelets 313  150 - 400 K/uL   GLUCOSE, CAPILLARY     Status: Abnormal   Collection Time   09/01/12  7:38 AM      Component Value Range Comment   Glucose-Capillary 182 (*) 70 - 99  mg/dL   BLOOD GAS, ARTERIAL     Status: Abnormal   Collection Time   09/01/12 10:35 AM      Component Value Range Comment    FIO2 50.00      Delivery systems OXYGEN MASK      pH, Arterial 7.288 (*) 7.350 - 7.450    pCO2 arterial 64.5 (*) 35.0 - 45.0 mmHg    pO2, Arterial 50.9 (*) 80.0 - 100.0 mmHg    Bicarbonate 29.9 (*) 20.0 - 24.0 mEq/L    TCO2 28.4  0 - 100 mmol/L    Acid-Base Excess 3.7 (*) 0.0 - 2.0 mmol/L    O2 Saturation 81.5      Patient temperature 37.0      Collection site RIGHT RADIAL      Drawn by COLLECTED BY RT      Sample type ARTERIAL      Allens test (pass/fail) PASS  PASS   GLUCOSE, CAPILLARY     Status: Abnormal   Collection Time   09/01/12 12:09 PM      Component Value Range Comment   Glucose-Capillary 170 (*) 70 - 99 mg/dL   GLUCOSE, CAPILLARY     Status: Abnormal   Collection Time   09/01/12  4:28 PM      Component Value Range Comment   Glucose-Capillary 146 (*) 70 - 99 mg/dL    Comment 1 Documented in Chart      Comment 2 Notify RN     GLUCOSE, CAPILLARY     Status: Abnormal   Collection Time   09/01/12  9:26 PM      Component Value Range Comment   Glucose-Capillary 158 (*) 70 - 99 mg/dL   CBC     Status: Abnormal   Collection Time   09/01/12 11:52 PM      Component Value Range Comment   WBC 8.1  4.0 - 10.5 K/uL    RBC 3.66 (*) 3.87 - 5.11 MIL/uL    Hemoglobin 10.2 (*) 12.0 - 15.0 g/dL    HCT 32.0 (*) 36.0 - 46.0 %    MCV 87.4  78.0 - 100.0 fL    MCH 27.9  26.0 - 34.0 pg    MCHC 31.9  30.0 - 36.0 g/dL    RDW 16.1 (*) 11.5 - 15.5 %    Platelets 283  150 - 400 K/uL   COMPREHENSIVE METABOLIC PANEL     Status: Abnormal   Collection Time   09/01/12 11:52 PM      Component Value Range Comment   Sodium 133 (*) 135 - 145 mEq/L    Potassium 6.0 (*) 3.5 - 5.1 mEq/L    Chloride 97  96 - 112 mEq/L    CO2 26  19 - 32 mEq/L    Glucose, Bld 156 (*) 70 - 99 mg/dL    BUN 36 (*) 6 - 23 mg/dL    Creatinine, Ser 2.99 (*) 0.50 - 1.10 mg/dL DELTA CHECK NOTED   Calcium 8.7  8.4 - 10.5 mg/dL    Total Protein 7.5  6.0 - 8.3 g/dL    Albumin 3.0 (*) 3.5 - 5.2 g/dL    AST 12  0 - 37 U/L     ALT 10  0 - 35 U/L    Alkaline Phosphatase 84  39 - 117 U/L    Total Bilirubin 0.5  0.3 - 1.2 mg/dL    GFR calc non Af Amer 18 (*) >90 mL/min  GFR calc Af Amer 21 (*) >90 mL/min   MAGNESIUM     Status: Normal   Collection Time   09/01/12 11:52 PM      Component Value Range Comment   Magnesium 2.1  1.5 - 2.5 mg/dL   PHOSPHORUS     Status: Abnormal   Collection Time   09/01/12 11:52 PM      Component Value Range Comment   Phosphorus 5.4 (*) 2.3 - 4.6 mg/dL   LIPASE, BLOOD     Status: Normal   Collection Time   09/01/12 11:52 PM      Component Value Range Comment   Lipase 24  11 - 59 U/L   POTASSIUM     Status: Normal   Collection Time   09/02/12  4:06 AM      Component Value Range Comment   Potassium 5.1  3.5 - 5.1 mEq/L   GLUCOSE, CAPILLARY     Status: Abnormal   Collection Time   09/02/12  7:19 AM      Component Value Range Comment   Glucose-Capillary 136 (*) 70 - 99 mg/dL    Comment 1 Notify RN       Dg Chest Port 1 View  08/31/2012  *RADIOLOGY REPORT*  Clinical Data: 46 year old female with low oxygenation, fever.  PORTABLE CHEST - 1 VIEW  Comparison: 1013 hours the same day and earlier.  Findings : [facility] portable upright view 2021 hours.  Stable left chest Port-A- Cath.  Increased coarse bilateral basilar predominant pulmonary opacity, greater on the right. Stable cardiomegaly and mediastinal contours.  Continued low lung volumes.  IMPRESSION: Increased right greater than left coarse pulmonary opacity suspicious for bilateral pneumonia.   Original Report Authenticated By: Randall An, M.D.    Dg Chest Port 1 View  08/31/2012  *RADIOLOGY REPORT*  Clinical Data: Fever  PORTABLE CHEST - 1 VIEW  Comparison: 07/05/2012  Findings: Study is limited by patient's large body habitus. Cardiomegaly again noted.  No acute infiltrate or pulmonary edema. Mild basilar atelectasis.  Lung bases partially obscured by overlying chest wall tissue.  Stable left subclavian Port-A-Cath position with tip  in SVC.  IMPRESSION: Limited study by patient's large body habitus.  Cardiomegaly again noted.  No acute infiltrate or pulmonary edema.  Mild basilar atelectasis.   Original Report Authenticated By: Lahoma Crocker, M.D.    US Abdomen Limited Ruq  09/01/2012  *RADIOLOGY REPORT*  Clinical Data: Nausea.  Evaluate gallbladder  LIMITED ABDOMINAL ULTRASOUND  Comparison:  None.  Findings: The gallbladder appears well distended.  Some internal specular echoes are identified suspicious for a small amount of sludge.  No cholelithiasis is evident.  No gallbladder wall thickening or pericholecystic fluid is seen.  The common bile duct measures 2.9 mm and has a normal appearance.  The liver is homogeneous in echotexture with no focal parenchymal abnormality noted.  The right lobe of the liver measures 22 cm in length and this may be due to the presence of a Reidel's lobe or indicate some hepatic enlargement.  No signs of intrahepatic ductal dilatation are seen.  No right upper quadrant fluid is noted.  Limited views of the right kidney shows no signs of hydronephrosis. Limited views of the pancreatic head and body appear normal in size and echotexture  IMPRESSION: Findings suggestive of a small amount of gallbladder sludge with no evidence for cholelithiasis or cholecystitis.  Prominent right lobe of the liver question hepatic enlargement or Reidel's lobe.   Original Report Authenticated  By: Ander Gaster, M.D.     Review of Systems  Constitutional: Positive for fever.  HENT: Positive for congestion.   Cardiovascular: Positive for orthopnea and leg swelling.  Gastrointestinal: Positive for nausea, vomiting and diarrhea.   Blood pressure 108/69, pulse 58, temperature 97.9 F (36.6 C), temperature source Axillary, resp. rate 18, height 5' 5" (1.651 m), weight 176.1 kg (388 lb 3.7 oz), SpO2 96.00%. Physical Exam  Constitutional: She is oriented to person, place, and time.  Eyes: No scleral icterus.  Neck: No JVD  present.  Cardiovascular:  No murmur heard. Respiratory: She has wheezes.  GI: There is no rebound.  Musculoskeletal: She exhibits edema.  Neurological: She is alert and oriented to person, place, and time.    Assessment/Plan: Problem #1 acute kidney injury superimposed on chronic the present increasing BUN and creatinine could be secondary to prerenal syndrome. Patient seems to have underlying stage III chronic renal failure. Problem #2 hyperkalemia most likely secondary to type IV RTA. Problem #3 history of anemia Problem #5 history of respiratory acidosis Problem #5 hypertension blood pressure seems reasonably controlled Problem #6 history of diabetes Problem #7 history of her obesity Problem #8 history of chronic lymphedema Problem #9 history of cellulitis of her legs Problem #10 history of hypoxemia she is on oxygen most likely secondary to hypoventilation syndrome. Plan: I agree with  starting her on IV fluid We'll start her also on Lasix to help [place] manage her potassium and also improve her urine output. We'll check her basic metabolic panel, phosphorus and CBC in the morning.  [name] S 09/02/2012, 9:01 AM

## 2012-09-02 NOTE — Progress Notes (Signed)
Subjective: She says she feels a little better. She is still very nauseated. She received some Kayexalate in the night.  Her breathing is better.  Objective: Vital signs in last 24 hours: Temp:  [97.3 F (36.3 C)-99.1 F (37.3 C)] 97.9 F (36.6 C) (09/14 0000) Pulse Rate:  [58-95] 58  (09/14 0800) Resp:  [14-24] 18  (09/14 0800) BP: (96-125)/(51-82) 108/69 mmHg (09/14 0400) SpO2:  [64 %-97 %] 96 % (09/14 0800) Weight:  [176.1 kg (388 lb 3.7 oz)] 176.1 kg (388 lb 3.7 oz) (09/14 0500) Weight change: 6.001 kg (13 lb 3.7 oz) Last BM Date: 09/02/12  Intake/Output from previous day: 09/13 0701 - 09/14 0700 In: 984 [I.V.:480; IV Piggyback:504] Out: 201 [Urine:200; Stool:1]  PHYSICAL EXAM General appearance: alert, cooperative and mild distress Resp: rhonchi bilaterally Cardio: regular rate and rhythm, S1, S2 normal, no murmur, click, rub or gallop GI: soft, non-tender; bowel sounds normal; no masses,  no organomegaly Extremities: extremities normal, atraumatic, no cyanosis or edema  Lab Results:    Basic Metabolic Panel:  Basename 09/02/12 0406 09/01/12 2352 09/01/12 0420  NA -- 133* 134*  K 5.1 6.0* --  CL -- 97 98  CO2 -- 26 28  GLUCOSE -- 156* 215*  BUN -- 36* 25*  CREATININE -- 2.99* 1.97*  CALCIUM -- 8.7 8.8  MG -- 2.1 --  PHOS -- 5.4* --   Liver Function Tests:  [facility] 09/01/12 2352 08/31/12 0957  AST 12 14  ALT 10 12  ALKPHOS 84 92  BILITOT 0.5 0.2*  PROT 7.5 7.6  ALBUMIN 3.0* 3.1*    Basename 09/01/12 2352  LIPASE 24  AMYLASE --   No results found for this basename: AMMONIA:2 in the last 72 hours CBC:  Basename 09/01/12 2352 09/01/12 0420 08/31/12 0957  WBC 8.1 11.8* --  NEUTROABS -- -- 5.1  HGB 10.2* 10.1* --  HCT 32.0* 32.9* --  MCV 87.4 88.2 --  PLT 283 313 --   Cardiac Enzymes: No results found for this basename: CKTOTAL:3,CKMB:3,CKMBINDEX:3,TROPONINI:3 in the last 72 hours BNP: No results found for this basename: PROBNP:3 in the last  72 hours D-Dimer: No results found for this basename: DDIMER:2 in the last 72 hours CBG:  Basename 09/02/12 0719 09/01/12 2126 09/01/12 1628 09/01/12 1209 09/01/12 0738 08/31/12 2146  GLUCAP 136* 158* 146* 170* 182* 220*   Hemoglobin A1C: No results found for this basename: HGBA1C in the last 72 hours Fasting Lipid Panel: No results found for this basename: CHOL,HDL,LDLCALC,TRIG,CHOLHDL,LDLDIRECT in the last 72 hours Thyroid Function Tests: No results found for this basename: TSH,T4TOTAL,FREET4,T3FREE,THYROIDAB in the last 72 hours Anemia Panel: No results found for this basename: VITAMINB12,FOLATE,FERRITIN,TIBC,IRON,RETICCTPCT in the last 72 hours Coagulation: No results found for this basename: LABPROT:2,INR:2 in the last 72 hours Urine Drug Screen: Drugs of Abuse  No results found for this basename: labopia, cocainscrnur, labbenz, amphetmu, thcu, labbarb    Alcohol Level: No results found for this basename: ETH:2 in the last 72 hours Urinalysis:  Basename 08/31/12 1144  COLORURINE YELLOW  LABSPEC 1.015  PHURINE 7.0  GLUCOSEU NEGATIVE  HGBUR NEGATIVE  BILIRUBINUR NEGATIVE  KETONESUR NEGATIVE  PROTEINUR NEGATIVE  UROBILINOGEN 0.2  NITRITE NEGATIVE  LEUKOCYTESUR NEGATIVE   Misc. Labs:  ABGS  Basename 09/01/12 1035  PHART 7.288*  PO2ART 50.9*  TCO2 28.4  HCO3 29.9*   CULTURES Recent Results (from the past 240 hour(s))  CULTURE, BLOOD (ROUTINE X 2)     Status: Normal (Preliminary result)   Collection  Time   08/31/12  8:54 PM      Component Value Range Status Comment   Specimen Description BLOOD LEFT ANTECUBITAL   Final    Special Requests BOTTLES DRAWN AEROBIC AND ANAEROBIC 6CC EACH   Final    Culture NO GROWTH 1 DAY   Final    Report Status PENDING   Incomplete   CULTURE, BLOOD (ROUTINE X 2)     Status: Normal (Preliminary result)   Collection Time   08/31/12  8:54 PM      Component Value Range Status Comment   Specimen Description BLOOD PORTA CATH   Final     Special Requests     Final    Value: BOTTLES DRAWN AEROBIC AND ANAEROBIC AEB 10CC ANA 5CC   Culture NO GROWTH 1 DAY   Final    Report Status PENDING   Incomplete   MRSA PCR SCREENING     Status: Normal   Collection Time   08/31/12  9:03 PM      Component Value Range Status Comment   MRSA by PCR NEGATIVE  NEGATIVE Final    Studies/Results: Dg Chest Port 1 View  08/31/2012  *RADIOLOGY REPORT*  Clinical Data: 46 year old female with low oxygenation, fever.  PORTABLE CHEST - 1 VIEW  Comparison: 1013 hours the same day and earlier.  Findings : AP portable upright view 2021 hours.  Stable left chest Port-A- Cath.  Increased coarse bilateral basilar predominant pulmonary opacity, greater on the right. Stable cardiomegaly and mediastinal contours.  Continued low lung volumes.  IMPRESSION: Increased right greater than left coarse pulmonary opacity suspicious for bilateral pneumonia.   Original Report Authenticated By: Randall An, M.D.    Dg Chest Port 1 View  08/31/2012  *RADIOLOGY REPORT*  Clinical Data: Fever  PORTABLE CHEST - 1 VIEW  Comparison: 07/05/2012  Findings: Study is limited by patient's large body habitus. Cardiomegaly again noted.  No acute infiltrate or pulmonary edema. Mild basilar atelectasis.  Lung bases partially obscured by overlying chest wall tissue.  Stable left subclavian Port-A-Cath position with tip in SVC.  IMPRESSION: Limited study by patient's large body habitus.  Cardiomegaly again noted.  No acute infiltrate or pulmonary edema.  Mild basilar atelectasis.   Original Report Authenticated By: Lahoma Crocker, M.D.    US Abdomen Limited Ruq  09/01/2012  *RADIOLOGY REPORT*  Clinical Data: Nausea.  Evaluate gallbladder  LIMITED ABDOMINAL ULTRASOUND  Comparison:  None.  Findings: The gallbladder appears well distended.  Some internal specular echoes are identified suspicious for a small amount of sludge.  No cholelithiasis is evident.  No gallbladder wall thickening or  pericholecystic fluid is seen.  The common bile duct measures 2.9 mm and has a normal appearance.  The liver is homogeneous in echotexture with no focal parenchymal abnormality noted.  The right lobe of the liver measures 22 cm in length and this may be due to the presence of a Reidel's lobe or indicate some hepatic enlargement.  No signs of intrahepatic ductal dilatation are seen.  No right upper quadrant fluid is noted.  Limited views of the right kidney shows no signs of hydronephrosis. Limited views of the pancreatic head and body appear normal in size and echotexture  IMPRESSION: Findings suggestive of a small amount of gallbladder sludge with no evidence for cholelithiasis or cholecystitis.  Prominent right lobe of the liver question hepatic enlargement or Reidel's lobe.   Original Report Authenticated By: Ander Gaster, M.D.     Medications:  Scheduled:   . amLODipine  10 mg Oral Daily  . antiseptic oral rinse  15 mL Mouth Rinse BID  . antiseptic oral rinse  15 mL Mouth Rinse q12n4p  . ceFTAROline (TEFLARO) IV  600 mg Intravenous Q12H  . chlorhexidine  15 mL Mouth Rinse BID  . docusate sodium  100 mg Oral BID  . enoxaparin (LOVENOX) injection  90 mg Subcutaneous Q24H  . insulin aspart  0-20 Units Subcutaneous TID WC  . insulin aspart  0-5 Units Subcutaneous QHS  . insulin aspart  6 Units Subcutaneous TID WC  . insulin glargine  30 Units Subcutaneous QHS  . metoprolol  50 mg Oral BID  . sodium chloride  500 mL Intravenous Once  . sodium chloride      . sodium chloride      . sodium polystyrene  30 g Oral Once  . DISCONTD: enalapril  2.5 mg Oral Daily  . DISCONTD: potassium chloride  20 mEq Oral BID  . DISCONTD: torsemide  50 mg Oral BID   Continuous:   . sodium chloride 20 mL/hr at 09/02/12 0800   XMD:[id number], acetaminophen, albuterol, alum & mag hydroxide-simeth, HYDROcodone-acetaminophen, HYDROmorphone (DILAUDID) injection, ondansetron (ZOFRAN) IV, ondansetron,  promethazine  Assesment: She was admitted with cellulitis of the legs. She has multiple other medical problems. Her renal function is worse. She was hyperkalemic and that has been treated. I think she's probably volume depleted although she does have marked edema of the legs this is chronic lymphedema Active Problems:  * No active hospital problems. *     Plan: Continue current treatments. She will have IV fluids. I have asked for nephrology consult    LOS: 2 days   Kimi Bordeau L 09/02/2012, 8:47 AM

## 2012-09-03 ENCOUNTER — Other Ambulatory Visit: Payer: Self-pay

## 2012-09-03 ENCOUNTER — Inpatient Hospital Stay (HOSPITAL_COMMUNITY): Payer: Medicaid Other

## 2012-09-03 LAB — CBC
MCH: 27.5 pg (ref 26.0–34.0)
MCHC: 31.8 g/dL (ref 30.0–36.0)
MCV: 86.4 fL (ref 78.0–100.0)
Platelets: 290 10*3/uL (ref 150–400)
RDW: 16.2 % — ABNORMAL HIGH (ref 11.5–15.5)

## 2012-09-03 LAB — BASIC METABOLIC PANEL
BUN: 43 mg/dL — ABNORMAL HIGH (ref 6–23)
Calcium: 8.5 mg/dL (ref 8.4–10.5)
Creatinine, Ser: 2.49 mg/dL — ABNORMAL HIGH (ref 0.50–1.10)
GFR calc non Af Amer: 22 mL/min — ABNORMAL LOW (ref >90)
Glucose, Bld: 115 mg/dL — ABNORMAL HIGH (ref 70–99)
Sodium: 132 mEq/L — ABNORMAL LOW (ref 135–145)

## 2012-09-03 LAB — BLOOD GAS, ARTERIAL
Acid-Base Excess: 2.6 mmol/L — ABNORMAL HIGH (ref 0.0–2.0)
O2 Content: 6 L/min
O2 Saturation: 89.9 %
Patient temperature: 37
pCO2 arterial: 59.1 mmHg (ref 35.0–45.0)

## 2012-09-03 MED ORDER — CHLORHEXIDINE GLUCONATE CLOTH 2 % EX PADS: 6.0000 | MEDICATED_PAD | Freq: Every day | CUTANEOUS | Status: DC

## 2012-09-03 MED ORDER — SODIUM CHLORIDE 0.9 % IJ SOLN
INTRAMUSCULAR | Status: AC
  Administered 2012-09-03: 10 mL
  Filled 2012-09-03: qty 3

## 2012-09-03 MED ORDER — SODIUM CHLORIDE 0.9 % IJ SOLN
INTRAMUSCULAR | Status: AC
  Administered 2012-09-03: 13:00:00
  Filled 2012-09-03: qty 3

## 2012-09-03 MED ORDER — MUPIROCIN 2 % EX OINT: 1.0000 "application " | TOPICAL_OINTMENT | Freq: Two times a day (BID) | CUTANEOUS | Status: DC

## 2012-09-03 MED ORDER — SODIUM CHLORIDE 0.9 % IJ SOLN
INTRAMUSCULAR | Status: AC
  Administered 2012-09-03: 08:00:00
  Filled 2012-09-03: qty 3

## 2012-09-03 NOTE — Progress Notes (Signed)
Subjective: Interval History: has no complaint of difficulty breathing. Since she is some weakness appetite is fair. She denies any nausea vomiting..  Objective: Vital signs in last 24 hours: Temp:  [97.2 F (36.2 C)-98 F (36.7 C)] 97.2 F (36.2 C) (09/15 0400) Pulse Rate:  [55-133] 68  (09/15 0805) Resp:  [17-28] 20  (09/15 0700) BP: (91-160)/(51-132) 116/69 mmHg (09/15 0700) SpO2:  [48 %-100 %] 90 % (09/15 0805) Weight:  [176.2 kg (388 lb 7.2 oz)] 176.2 kg (388 lb 7.2 oz) (09/15 0500) Weight change: 0.1 kg (3.5 oz)  Intake/Output from previous day: 09/14 0701 - 09/15 0700 In: 3153.3 [I.V.:2641.3; IV Piggyback:512] Out: 8250 [Urine:1450; Stool:1] Intake/Output this shift:    General appearance: alert, cooperative and no distress Resp: diminished breath sounds bilaterally Cardio: regular rate and rhythm, S1, S2 normal, no murmur, click, rub or gallop GI: Obese -- difficult to palpate organomegaly. Extremities: edema Some edema but difficult to assess because of chronic lymphedema of the legs.  Lab Results:  Basename 09/03/12 0512 09/01/12 2352  WBC 7.9 8.1  HGB 10.3* 10.2*  HCT 32.4* 32.0*  PLT 290 283   BMET:  Basename 09/03/12 0512 09/02/12 1815 09/01/12 2352  NA 132* -- 133*  K 4.4 4.8 --  CL 98 -- 97  CO2 26 -- 26  GLUCOSE 115* -- 156*  BUN 43* -- 36*  CREATININE 2.49* -- 2.99*  CALCIUM 8.5 -- 8.7   No results found for this basename: PTH:2 in the last 72 hours Iron Studies: No results found for this basename: IRON,TIBC,TRANSFERRIN,FERRITIN in the last 72 hours  Studies/Results: US Abdomen Limited Ruq  09/01/2012  *RADIOLOGY REPORT*  Clinical Data: Nausea.  Evaluate gallbladder  LIMITED ABDOMINAL ULTRASOUND  Comparison:  None.  Findings: The gallbladder appears well distended.  Some internal specular echoes are identified suspicious for a small amount of sludge.  No cholelithiasis is evident.  No gallbladder wall thickening or pericholecystic fluid is seen.   The common bile duct measures 2.9 mm and has a normal appearance.  The liver is homogeneous in echotexture with no focal parenchymal abnormality noted.  The right lobe of the liver measures 22 cm in length and this may be due to the presence of a Reidel's lobe or indicate some hepatic enlargement.  No signs of intrahepatic ductal dilatation are seen.  No right upper quadrant fluid is noted.  Limited views of the right kidney shows no signs of hydronephrosis. Limited views of the pancreatic head and body appear normal in size and echotexture  IMPRESSION: Findings suggestive of a small amount of gallbladder sludge with no evidence for cholelithiasis or cholecystitis.  Prominent right lobe of the liver question hepatic enlargement or Reidel's lobe.   Original Report Authenticated By: Ander Gaster, M.D.     I have reviewed the patient's current medications.  Assessment/Plan: Problem #1 acute kidney injury superimposed on chronic her BUN is 43 creatinine is 2.49 renal function seems to be improving. Presently she is comforted to none oliguric. Problem #2 hyperkalemia potassium is normal Problem #3 hypertension her blood pressure seems to be somewhat better Problem #4 history of diabetes Problem #5 history of morbid obesity  Problem #6 history of anemia her hemoglobin and hematocrit is stable  problem #7 history of cellulitis of her legs Problem #8 hyperphosphatemia phosphorus has corrected. Plan: We'll continue his present management and we'll follow her blood work.    LOS: 3 days   Cassity Christian S 09/03/2012,9:04 AM

## 2012-09-03 NOTE — Progress Notes (Signed)
Pt c/o " burning pain acrossed chest and shortness of breath" at 1800.Dr Karie Kirks notified by phone. EKG obtained, showed no changes since last EKG. Maalox given, and albuterol nebulizer treatment. Pt denies pain at this time.

## 2012-09-03 NOTE — Progress Notes (Signed)
Subjective: She looks better this morning and says she feels a little bit better. She is not as nauseated. Her breathing is okay. Her legs are about the same  Objective: Vital signs in last 24 hours: Temp:  [97.2 F (36.2 C)-98 F (36.7 C)] 97.2 F (36.2 C) (09/15 0400) Pulse Rate:  [55-133] 56  (09/15 0700) Resp:  [17-28] 20  (09/15 0700) BP: (91-160)/(51-132) 116/69 mmHg (09/15 0700) SpO2:  [48 %-100 %] 90 % (09/15 0700) Weight:  [176.2 kg (388 lb 7.2 oz)] 176.2 kg (388 lb 7.2 oz) (09/15 0500) Weight change: 0.1 kg (3.5 oz) Last BM Date: 09/02/12  Intake/Output from previous day: 09/14 0701 - 09/15 0700 In: 3153.3 [I.V.:2641.3; IV Piggyback:512] Out: 5784 [Urine:1450; Stool:1]  PHYSICAL EXAM General appearance: alert, cooperative, mild distress and morbidly obese Resp: diminished breath sounds bilaterally Cardio: regular rate and rhythm, S1, S2 normal, no murmur, click, rub or gallop GI: soft, non-tender; bowel sounds normal; no masses,  no organomegaly Extremities: extremities normal, atraumatic, no cyanosis or edema  Lab Results:    Basic Metabolic Panel:  Basename 09/03/12 0512 09/02/12 1815 09/01/12 2352  NA 132* -- 133*  K 4.4 4.8 --  CL 98 -- 97  CO2 26 -- 26  GLUCOSE 115* -- 156*  BUN 43* -- 36*  CREATININE 2.49* -- 2.99*  CALCIUM 8.5 -- 8.7  MG -- -- 2.1  PHOS 4.4 -- 5.4*   Liver Function Tests:  [facility] 09/01/12 2352 08/31/12 0957  AST 12 14  ALT 10 12  ALKPHOS 84 92  BILITOT 0.5 0.2*  PROT 7.5 7.6  ALBUMIN 3.0* 3.1*    Basename 09/01/12 2352  LIPASE 24  AMYLASE --   No results found for this basename: AMMONIA:2 in the last 72 hours CBC:  Basename 09/03/12 0512 09/01/12 2352 08/31/12 0957  WBC 7.9 8.1 --  NEUTROABS -- -- 5.1  HGB 10.3* 10.2* --  HCT 32.4* 32.0* --  MCV 86.4 87.4 --  PLT 290 283 --   Cardiac Enzymes: No results found for this basename: CKTOTAL:3,CKMB:3,CKMBINDEX:3,TROPONINI:3 in the last 72 hours BNP: No results  found for this basename: PROBNP:3 in the last 72 hours D-Dimer: No results found for this basename: DDIMER:2 in the last 72 hours CBG:  Basename 09/02/12 2054 09/02/12 1638 09/02/12 1117 09/02/12 0719 09/01/12 2126 09/01/12 1628  GLUCAP 105* 103* 126* 136* 158* 146*   Hemoglobin A1C: No results found for this basename: HGBA1C in the last 72 hours Fasting Lipid Panel: No results found for this basename: CHOL,HDL,LDLCALC,TRIG,CHOLHDL,LDLDIRECT in the last 72 hours Thyroid Function Tests: No results found for this basename: TSH,T4TOTAL,FREET4,T3FREE,THYROIDAB in the last 72 hours Anemia Panel: No results found for this basename: VITAMINB12,FOLATE,FERRITIN,TIBC,IRON,RETICCTPCT in the last 72 hours Coagulation: No results found for this basename: LABPROT:2,INR:2 in the last 72 hours Urine Drug Screen: Drugs of Abuse  No results found for this basename: labopia, cocainscrnur, labbenz, amphetmu, thcu, labbarb    Alcohol Level: No results found for this basename: ETH:2 in the last 72 hours Urinalysis:  Basename 08/31/12 1144  COLORURINE YELLOW  LABSPEC 1.015  PHURINE 7.0  GLUCOSEU NEGATIVE  HGBUR NEGATIVE  BILIRUBINUR NEGATIVE  KETONESUR NEGATIVE  PROTEINUR NEGATIVE  UROBILINOGEN 0.2  NITRITE NEGATIVE  LEUKOCYTESUR NEGATIVE   Misc. Labs:  ABGS  Basename 09/03/12 0618  PHART 7.303*  PO2ART 65.1*  TCO2 27.0  HCO3 28.4*   CULTURES Recent Results (from the past 240 hour(s))  CULTURE, BLOOD (ROUTINE X 2)     Status:  Normal (Preliminary result)   Collection Time   08/31/12  8:54 PM      Component Value Range Status Comment   Specimen Description BLOOD LEFT ANTECUBITAL   Final    Special Requests BOTTLES DRAWN AEROBIC AND ANAEROBIC 6CC EACH   Final    Culture NO GROWTH 3 DAYS   Final    Report Status PENDING   Incomplete   CULTURE, BLOOD (ROUTINE X 2)     Status: Normal (Preliminary result)   Collection Time   08/31/12  8:54 PM      Component Value Range Status Comment    Specimen Description BLOOD PORTA CATH   Final    Special Requests     Final    Value: BOTTLES DRAWN AEROBIC AND ANAEROBIC AEB 10CC ANA 5CC   Culture NO GROWTH 3 DAYS   Final    Report Status PENDING   Incomplete   MRSA PCR SCREENING     Status: Normal   Collection Time   08/31/12  9:03 PM      Component Value Range Status Comment   MRSA by PCR NEGATIVE  NEGATIVE Final    Studies/Results: US Abdomen Limited Ruq  09/01/2012  *RADIOLOGY REPORT*  Clinical Data: Nausea.  Evaluate gallbladder  LIMITED ABDOMINAL ULTRASOUND  Comparison:  None.  Findings: The gallbladder appears well distended.  Some internal specular echoes are identified suspicious for a small amount of sludge.  No cholelithiasis is evident.  No gallbladder wall thickening or pericholecystic fluid is seen.  The common bile duct measures 2.9 mm and has a normal appearance.  The liver is homogeneous in echotexture with no focal parenchymal abnormality noted.  The right lobe of the liver measures 22 cm in length and this may be due to the presence of a Reidel's lobe or indicate some hepatic enlargement.  No signs of intrahepatic ductal dilatation are seen.  No right upper quadrant fluid is noted.  Limited views of the right kidney shows no signs of hydronephrosis. Limited views of the pancreatic head and body appear normal in size and echotexture  IMPRESSION: Findings suggestive of a small amount of gallbladder sludge with no evidence for cholelithiasis or cholecystitis.  Prominent right lobe of the liver question hepatic enlargement or Reidel's lobe.   Original Report Authenticated By: Ander Gaster, M.D.     Medications:  Prior to Admission:  Prescriptions prior to admission  Medication Sig Dispense Refill  . acetaminophen (TYLENOL) 325 MG tablet Take 650 mg by mouth every 6 (six) hours as needed. fever      . amLODipine (NORVASC) 10 MG tablet Take 1 tablet (10 mg total) by mouth daily.  30 tablet  0  . docusate sodium (COLACE) 100  MG capsule Take 100 mg by mouth 2 (two) times daily as needed. For constipation      . enalapril (VASOTEC) 2.5 MG tablet Take 2.5 mg by mouth daily.      Marland Kitchen HYDROcodone-acetaminophen (NORCO) 10-325 MG per tablet Take 1 tablet by mouth 4 (four) times daily as needed. For pain      . insulin NPH-insulin regular (NOVOLIN 70/30) (70-30) 100 UNIT/ML injection Inject 60 Units into the skin 2 (two) times daily.      . metoprolol (LOPRESSOR) 50 MG tablet Take 1 tablet (50 mg total) by mouth 2 (two) times daily.  60 tablet  0  . ondansetron (ZOFRAN) 8 MG tablet Take 8 mg by mouth 4 (four) times daily as needed. For nausea      .  potassium chloride 20 MEQ/15ML (10%) solution Take 20 mEq by mouth 2 (two) times daily.       Marland Kitchen torsemide (DEMADEX) 100 MG tablet Take 50 mg by mouth 2 (two) times daily.        Scheduled:   . amLODipine  10 mg Oral Daily  . antiseptic oral rinse  15 mL Mouth Rinse BID  . antiseptic oral rinse  15 mL Mouth Rinse q12n4p  . ceFTAROline (TEFLARO) IV  600 mg Intravenous Q12H  . chlorhexidine  15 mL Mouth Rinse BID  . docusate sodium  100 mg Oral BID  . enoxaparin (LOVENOX) injection  90 mg Subcutaneous Q24H  . furosemide  60 mg Intravenous BID  . insulin aspart  0-20 Units Subcutaneous TID WC  . insulin aspart  0-5 Units Subcutaneous QHS  . insulin aspart  6 Units Subcutaneous TID WC  . insulin glargine  30 Units Subcutaneous QHS  . metoprolol  50 mg Oral BID  . moxifloxacin  400 mg Intravenous Q24H  . sodium chloride      . DISCONTD: Chlorhexidine Gluconate Cloth  6 each Topical Q0600  . DISCONTD: mupirocin ointment  1 application Nasal BID   Continuous:   . sodium chloride 125 mL/hr at 09/03/12 0700   XPF:[id number], acetaminophen, albuterol, alum & mag hydroxide-simeth, HYDROcodone-acetaminophen, HYDROmorphone (DILAUDID) injection, ondansetron (ZOFRAN) IV, ondansetron, promethazine  Assesment: She has cellulitis of the legs. She has pneumonia. She has morbid  obesity. She has obesity hypoventilation. She has acute on chronic renal failure which is improved her potassium was elevated and that is improved Active Problems:  * No active hospital problems. *     Plan: No change in treatments    LOS: 3 days   Tristy Udovich L 09/03/2012, 7:42 AM

## 2012-09-03 NOTE — Progress Notes (Signed)
CRITICAL VALUE ALERT  Critical value received:  CO2=59.1  Date of notification:  09/03/12  Time of notification:  0620  Critical value read back:yes  Nurse who received alert:  Manuela Schwartz, RN   MD notified (1st page):    Time of first page:    MD notified (2nd page):  Time of second page:  Responding MD:    Time MD responded:   Spoke with Elzie Rings RN with [facility] about this and she stated the doctor had seen it. No additional orders noted at this time.  Pt did not sleep with BiPAP last PM.  Nursing staff to continue to monitor.

## 2012-09-03 NOTE — Progress Notes (Signed)
Pt has declined BiPAP so far tonight. Pt may need at home as she already wears O2 when she sleeps

## 2012-09-04 DIAGNOSIS — E662 Morbid (severe) obesity with alveolar hypoventilation: Secondary | ICD-10-CM | POA: Diagnosis present

## 2012-09-04 DIAGNOSIS — J189 Pneumonia, unspecified organism: Secondary | ICD-10-CM | POA: Diagnosis present

## 2012-09-04 LAB — GLUCOSE, CAPILLARY: Glucose-Capillary: 92 mg/dL (ref 70–99)

## 2012-09-04 LAB — COMPREHENSIVE METABOLIC PANEL
ALT: 20 U/L (ref 0–35)
Alkaline Phosphatase: 97 U/L (ref 39–117)
CO2: 27 mEq/L (ref 19–32)
Chloride: 103 mEq/L (ref 96–112)
GFR calc Af Amer: 34 mL/min — ABNORMAL LOW (ref >90)
GFR calc non Af Amer: 29 mL/min — ABNORMAL LOW (ref >90)
Glucose, Bld: 88 mg/dL (ref 70–99)
Potassium: 4.2 mEq/L (ref 3.5–5.1)
Sodium: 139 mEq/L (ref 135–145)
Total Bilirubin: 0.3 mg/dL (ref 0.3–1.2)

## 2012-09-04 MED ORDER — SODIUM CHLORIDE 0.9 % IJ SOLN
INTRAMUSCULAR | Status: AC
  Filled 2012-09-04: qty 3

## 2012-09-04 MED ORDER — TORSEMIDE 20 MG PO TABS
20.0000 mg | ORAL_TABLET | Freq: Once | ORAL | Status: AC
  Administered 2012-09-04: 20 mg via ORAL
  Filled 2012-09-04: qty 1

## 2012-09-04 NOTE — Progress Notes (Signed)
Tried pt on nasal cpap 12 ,35% as she  Drops sats while asleep

## 2012-09-04 NOTE — Care Management Note (Unsigned)
    Page 1 of 1   09/04/2012     2:49:56 PM   CARE MANAGEMENT NOTE 09/04/2012  Patient:  [name], [name]   Account Number:  [id number]  Date Initiated:  09/04/2012  Documentation initiated by:  Theophilus Kinds  Subjective/Objective Assessment:   Pt admitted from home with cellulitis and pneumonia. Pt lives with husband and will return home with husband at discharge. Pt has used [facility] in the past for IVAB.     Action/Plan:   Pt may need IV AB at discharge. Will follow for CM/HH needs.   Anticipated DC Date:  09/06/2012   Anticipated DC Plan:  SKILLED NURSING FACILITY  In-house referral  Clinical Social Worker      DC Planning Services  CM consult      Choice offered to / List presented to:             Status of service:  In process, will continue to follow Medicare Important Message given?   (If response is "NO", the following Medicare IM given date fields will be blank) Date Medicare IM given:   Date Additional Medicare IM given:    Discharge Disposition:    Per UR Regulation:    If discussed at Long Length of Stay Meetings, dates discussed:    Comments:  09/04/12 Cass, RN BSN CM

## 2012-09-04 NOTE — Progress Notes (Signed)
UR Chart Review Completed

## 2012-09-04 NOTE — Progress Notes (Signed)
Pt set between CPAP 8 and 6 , mainly 8 fio2 30%  sats 96

## 2012-09-04 NOTE — Progress Notes (Signed)
Subjective: Interval History: has complaints increasing leg swelling. She denies any difficulty breathing no orthopnea or paroxysmal nocturnal dyspnea. She denies also any nausea vomiting overall she says she is feeling better..  Objective: Vital signs in last 24 hours: Temp:  [97.9 F (36.6 C)-98.8 F (37.1 C)] 97.9 F (36.6 C) (09/16 0000) Pulse Rate:  [51-68] 62  (09/16 0600) Resp:  [17-24] 24  (09/16 0600) BP: (67-127)/(48-109) 125/74 mmHg (09/16 0600) SpO2:  [84 %-98 %] 95 % (09/16 0600) Weight change:   Intake/Output from previous day: 09/15 0701 - 09/16 0700 In: 4017 [P.O.:120; I.V.:2875; IV Piggyback:1022] Out: 1500 [Urine:1500] Intake/Output this shift:    General appearance: alert, cooperative and no distress Resp: diminished breath sounds bilaterally Cardio: regular rate and rhythm, S1, S2 normal, no murmur, click, rub or gallop Extremities: edema Patient with lymphedema difficult to assess whether she has increased swelling or note.  Lab Results:  Basename 09/03/12 0512 09/01/12 2352  WBC 7.9 8.1  HGB 10.3* 10.2*  HCT 32.4* 32.0*  PLT 290 283   BMET:  Basename 09/04/12 0500 09/03/12 0512  NA 139 132*  K 4.2 4.4  CL 103 98  CO2 27 26  GLUCOSE 88 115*  BUN 39* 43*  CREATININE 1.99* 2.49*  CALCIUM 8.6 8.5   No results found for this basename: PTH:2 in the last 72 hours Iron Studies: No results found for this basename: IRON,TIBC,TRANSFERRIN,FERRITIN in the last 72 hours  Studies/Results: Dg Chest Port 1 View  09/03/2012  *RADIOLOGY REPORT*  Clinical Data: Follow-up pneumonia  PORTABLE CHEST - 1 VIEW  Comparison: 09/12  Findings: Left subclavian power port is unchanged with its tip in the SVC at the azygos level.  Artifact overlies chest.  Bilateral pulmonary infiltrates are improving.  No areas worsening or new findings.  IMPRESSION: Improving bilateral pulmonary infiltrates.   Original Report Authenticated By: Jules Schick, M.D.     I have reviewed the  patient's current medications.  Assessment/Plan: Problem #1 acute kidney injury superimposed on chronic her belly 79 creatinine is 1.99 renal function has improved. Problem #2 hyponatremia sodium 139. Problem #3 anemia Problem #4 hypertension blood pressure seems to be reasonably controlled. Problem #5 history of diabetes Problem #6 history of cellulitis patient presently a febrile with normal white blood cell count. Problem #7 history of hyperkalemia potassium is 4.2 hence corrected. Problem #8 history of lymphedema presently the patient is none oliguric but she is positive about 2 L. Plan: We'll decrease her IV fluid to 55 cc per hour We'll  continue with the IV Lasix and the probably changed to Demadex. We'll check her basic metabolic panel in the morning.    LOS: 4 days   Makaylin Carlo S 09/04/2012,7:18 AM

## 2012-09-04 NOTE — Progress Notes (Signed)
Subjective: She says she doesn't feel well today. She has no specific complaints. She feels like her leg swelling is worse.  Objective: Vital signs in last 24 hours: Temp:  [97.9 F (36.6 C)-98.8 F (37.1 C)] 97.9 F (36.6 C) (09/16 0000) Pulse Rate:  [51-68] 62  (09/16 0600) Resp:  [17-24] 24  (09/16 0600) BP: (67-127)/(48-109) 125/74 mmHg (09/16 0600) SpO2:  [84 %-98 %] 95 % (09/16 0600) Weight change:  Last BM Date: 09/02/12  Intake/Output from previous day: 09/15 0701 - 09/16 0700 In: 4017 [P.O.:120; I.V.:2875; IV Piggyback:1022] Out: 1500 [Urine:1500]  PHYSICAL EXAM General appearance: alert, cooperative, mild distress and morbidly obese Resp: clear to auscultation bilaterally Cardio: regular rate and rhythm, S1, S2 normal, no murmur, click, rub or gallop GI: Obese without masses Extremities: Marked lymphedema and still some changes of cellulitis  Lab Results:    Basic Metabolic Panel:  Basename 09/04/12 0500 09/03/12 0512 09/01/12 2352  NA 139 132* --  K 4.2 4.4 --  CL 103 98 --  CO2 27 26 --  GLUCOSE 88 115* --  BUN 39* 43* --  CREATININE 1.99* 2.49* --  CALCIUM 8.6 8.5 --  MG -- -- 2.1  PHOS -- 4.4 5.4*   Liver Function Tests:  Basename 09/04/12 0500 09/01/12 2352  AST 21 12  ALT 20 10  ALKPHOS 97 84  BILITOT 0.3 0.5  PROT 7.0 7.5  ALBUMIN 2.7* 3.0*    Basename 09/01/12 2352  LIPASE 24  AMYLASE --   No results found for this basename: AMMONIA:2 in the last 72 hours CBC:  Basename 09/03/12 0512 09/01/12 2352  WBC 7.9 8.1  NEUTROABS -- --  HGB 10.3* 10.2*  HCT 32.4* 32.0*  MCV 86.4 87.4  PLT 290 283   Cardiac Enzymes: No results found for this basename: CKTOTAL:3,CKMB:3,CKMBINDEX:3,TROPONINI:3 in the last 72 hours BNP: No results found for this basename: PROBNP:3 in the last 72 hours D-Dimer: No results found for this basename: DDIMER:2 in the last 72 hours CBG:  Basename 09/03/12 2208 09/03/12 1617 09/03/12 1230 09/03/12 0745  09/02/12 2054 09/02/12 1638  GLUCAP 100* 121* 101* 114* 105* 103*   Hemoglobin A1C: No results found for this basename: HGBA1C in the last 72 hours Fasting Lipid Panel: No results found for this basename: CHOL,HDL,LDLCALC,TRIG,CHOLHDL,LDLDIRECT in the last 72 hours Thyroid Function Tests: No results found for this basename: TSH,T4TOTAL,FREET4,T3FREE,THYROIDAB in the last 72 hours Anemia Panel: No results found for this basename: VITAMINB12,FOLATE,FERRITIN,TIBC,IRON,RETICCTPCT in the last 72 hours Coagulation: No results found for this basename: LABPROT:2,INR:2 in the last 72 hours Urine Drug Screen: Drugs of Abuse  No results found for this basename: labopia, cocainscrnur, labbenz, amphetmu, thcu, labbarb    Alcohol Level: No results found for this basename: ETH:2 in the last 72 hours Urinalysis: No results found for this basename: COLORURINE:2,APPERANCEUR:2,LABSPEC:2,PHURINE:2,GLUCOSEU:2,HGBUR:2,BILIRUBINUR:2,KETONESUR:2,PROTEINUR:2,UROBILINOGEN:2,NITRITE:2,LEUKOCYTESUR:2 in the last 72 hours Misc. Labs:  ABGS  Basename 09/03/12 0618  PHART 7.303*  PO2ART 65.1*  TCO2 27.0  HCO3 28.4*   CULTURES Recent Results (from the past 240 hour(s))  CULTURE, BLOOD (ROUTINE X 2)     Status: Normal (Preliminary result)   Collection Time   08/31/12  8:54 PM      Component Value Range Status Comment   Specimen Description BLOOD LEFT ANTECUBITAL   Final    Special Requests BOTTLES DRAWN AEROBIC AND ANAEROBIC [facility] EACH   Final    Culture NO GROWTH 3 DAYS   Final    Report Status PENDING   Incomplete  CULTURE, BLOOD (ROUTINE X 2)     Status: Normal (Preliminary result)   Collection Time   08/31/12  8:54 PM      Component Value Range Status Comment   Specimen Description BLOOD PORTA CATH   Final    Special Requests     Final    Value: BOTTLES DRAWN AEROBIC AND ANAEROBIC AEB 10CC ANA 5CC   Culture NO GROWTH 3 DAYS   Final    Report Status PENDING   Incomplete   MRSA PCR SCREENING      Status: Normal   Collection Time   08/31/12  9:03 PM      Component Value Range Status Comment   MRSA by PCR NEGATIVE  NEGATIVE Final    Studies/Results: Dg Chest Port 1 View  09/03/2012  *RADIOLOGY REPORT*  Clinical Data: Follow-up pneumonia  PORTABLE CHEST - 1 VIEW  Comparison: 09/12  Findings: Left subclavian power port is unchanged with its tip in the SVC at the azygos level.  Artifact overlies chest.  Bilateral pulmonary infiltrates are improving.  No areas worsening or new findings.  IMPRESSION: Improving bilateral pulmonary infiltrates.   Original Report Authenticated By: Jules Schick, M.D.     Medications:  Prior to Admission:  Prescriptions prior to admission  Medication Sig Dispense Refill  . acetaminophen (TYLENOL) 325 MG tablet Take 650 mg by mouth every 6 (six) hours as needed. fever      . amLODipine (NORVASC) 10 MG tablet Take 1 tablet (10 mg total) by mouth daily.  30 tablet  0  . docusate sodium (COLACE) 100 MG capsule Take 100 mg by mouth 2 (two) times daily as needed. For constipation      . enalapril (VASOTEC) 2.5 MG tablet Take 2.5 mg by mouth daily.      Marland Kitchen HYDROcodone-acetaminophen (NORCO) 10-325 MG per tablet Take 1 tablet by mouth 4 (four) times daily as needed. For pain      . insulin NPH-insulin regular (NOVOLIN 70/30) (70-30) 100 UNIT/ML injection Inject 60 Units into the skin 2 (two) times daily.      . metoprolol (LOPRESSOR) 50 MG tablet Take 1 tablet (50 mg total) by mouth 2 (two) times daily.  60 tablet  0  . ondansetron (ZOFRAN) 8 MG tablet Take 8 mg by mouth 4 (four) times daily as needed. For nausea      . potassium chloride 20 MEQ/15ML (10%) solution Take 20 mEq by mouth 2 (two) times daily.       Marland Kitchen torsemide (DEMADEX) 100 MG tablet Take 50 mg by mouth 2 (two) times daily.        Scheduled:   . amLODipine  10 mg Oral Daily  . antiseptic oral rinse  15 mL Mouth Rinse BID  . antiseptic oral rinse  15 mL Mouth Rinse q12n4p  . ceFTAROline (TEFLARO) IV   600 mg Intravenous Q12H  . docusate sodium  100 mg Oral BID  . enoxaparin (LOVENOX) injection  90 mg Subcutaneous Q24H  . insulin aspart  0-20 Units Subcutaneous TID WC  . insulin aspart  0-5 Units Subcutaneous QHS  . insulin aspart  6 Units Subcutaneous TID WC  . insulin glargine  30 Units Subcutaneous QHS  . metoprolol  50 mg Oral BID  . moxifloxacin  400 mg Intravenous Q24H  . sodium chloride      . sodium chloride      . sodium chloride      . torsemide  20 mg Oral  Once  . DISCONTD: chlorhexidine  15 mL Mouth Rinse BID  . DISCONTD: furosemide  60 mg Intravenous BID   Continuous:   . sodium chloride 125 mL/hr at 09/04/12 0600   FJU:[id number], acetaminophen, albuterol, alum & mag hydroxide-simeth, HYDROcodone-acetaminophen, HYDROmorphone (DILAUDID) injection, ondansetron (ZOFRAN) IV, ondansetron, promethazine  Assesment: She has cellulitis. She has pneumonia which is improving by chest x-ray. She has obesity hypoventilation. She has chronic lymphedema. She has acute on chronic renal failure which is improved Active Problems:  * No active hospital problems. *     Plan: Continue current medications and treatments    LOS: 4 days   Derryck Shahan L 09/04/2012, 7:55 AM

## 2012-09-05 DIAGNOSIS — R112 Nausea with vomiting, unspecified: Secondary | ICD-10-CM | POA: Diagnosis present

## 2012-09-05 DIAGNOSIS — E875 Hyperkalemia: Secondary | ICD-10-CM | POA: Diagnosis not present

## 2012-09-05 LAB — CULTURE, BLOOD (ROUTINE X 2)

## 2012-09-05 LAB — BASIC METABOLIC PANEL
GFR calc non Af Amer: 41 mL/min — ABNORMAL LOW (ref >90)
Glucose, Bld: 75 mg/dL (ref 70–99)
Potassium: 4.3 mEq/L (ref 3.5–5.1)
Sodium: 140 mEq/L (ref 135–145)

## 2012-09-05 LAB — GLUCOSE, CAPILLARY
Glucose-Capillary: 94 mg/dL (ref 70–99)
Glucose-Capillary: 136 mg/dL — ABNORMAL HIGH (ref 70–99)

## 2012-09-05 MED ORDER — FLUCONAZOLE 100 MG PO TABS
100.0000 mg | ORAL_TABLET | Freq: Every day | ORAL | Status: DC
  Administered 2012-09-06 – 2012-09-09 (×4): 100 mg via ORAL
  Filled 2012-09-05 (×4): qty 1

## 2012-09-05 MED ORDER — MOXIFLOXACIN HCL 400 MG PO TABS
400.0000 mg | ORAL_TABLET | ORAL | Status: DC
  Administered 2012-09-05 – 2012-09-08 (×4): 400 mg via ORAL
  Filled 2012-09-05 (×4): qty 1

## 2012-09-05 NOTE — Progress Notes (Signed)
PHARMACIST - PHYSICIAN COMMUNICATION DR:   [name] CONCERNING: Antibiotic IV to Oral Route Change Policy  RECOMMENDATION: This patient is receiving Avelox by the intravenous route.  Based on criteria approved by the Pharmacy and Therapeutics Committee, the antibiotic(s) is/are being converted to the equivalent oral dose form(s).   DESCRIPTION: These criteria include:  Patient being treated for a respiratory tract infection, urinary tract infection, or cellulitis  The patient is not neutropenic and does not exhibit a GI malabsorption state  The patient is eating (either orally or via tube) and/or has been taking other orally administered medications for a least 24 hours  The patient is improving clinically and has a Tmax < 100.5  If you have questions about this conversion, please contact the Pharmacy Department  _0   [phone] )  [facility] _1   [phone] )  [facility]  _2   [phone] )  [facility] _3   [phone] )  [place], PharmD, BCPS 09/05/2012_4 :07 AM

## 2012-09-05 NOTE — Progress Notes (Signed)
Patient placed on BIPAP 8/4 5 lpm;patient monitored with pulse ox to check saturations.

## 2012-09-05 NOTE — Progress Notes (Signed)
Subjective: She says she feels better. She has been able to eat a little bit. She has no other new complaints. Her breathing is okay. Her legs feel better  Objective: Vital signs in last 24 hours: Temp:  [97.8 F (36.6 C)-98.9 F (37.2 C)] 97.9 F (36.6 C) (09/17 0414) Pulse Rate:  [63-75] 66  (09/17 0600) Resp:  [18-27] 27  (09/17 0600) BP: (114-151)/(58-86) 136/65 mmHg (09/17 0600) SpO2:  [92 %-100 %] 96 % (09/17 0600) Weight:  [176 kg (388 lb 0.2 oz)] 176 kg (388 lb 0.2 oz) (09/17 0414) Weight change:  Last BM Date: 09/02/12  Intake/Output from previous day: 09/16 0701 - 09/17 0700 In: 2519 [P.O.:540; I.V.:1225; IV Piggyback:754] Out: 1800 [Urine:1800]  PHYSICAL EXAM General appearance: alert, cooperative, no distress and morbidly obese Resp: clear to auscultation bilaterally Cardio: regular rate and rhythm, S1, S2 normal, no murmur, click, rub or gallop GI: soft, non-tender; bowel sounds normal; no masses,  no organomegaly Extremities: Marked lymphedema but less erythema  Lab Results:    Basic Metabolic Panel:  Basename 09/05/12 0422 09/04/12 0500 09/03/12 0512  NA 140 139 --  K 4.3 4.2 --  CL 104 103 --  CO2 30 27 --  GLUCOSE 75 88 --  BUN 28* 39* --  CREATININE 1.50* 1.99* --  CALCIUM 8.7 8.6 --  MG -- -- --  PHOS -- -- 4.4   Liver Function Tests:  Basename 09/04/12 0500  AST 21  ALT 20  ALKPHOS 97  BILITOT 0.3  PROT 7.0  ALBUMIN 2.7*   No results found for this basename: LIPASE:2,AMYLASE:2 in the last 72 hours No results found for this basename: AMMONIA:2 in the last 72 hours CBC:  Basename 09/03/12 0512  WBC 7.9  NEUTROABS --  HGB 10.3*  HCT 32.4*  MCV 86.4  PLT 290   Cardiac Enzymes: No results found for this basename: CKTOTAL:3,CKMB:3,CKMBINDEX:3,TROPONINI:3 in the last 72 hours BNP: No results found for this basename: PROBNP:3 in the last 72 hours D-Dimer: No results found for this basename: DDIMER:2 in the last 72  hours CBG:  Basename 09/04/12 2151 09/04/12 1619 09/04/12 1120 09/04/12 0742 09/03/12 2208 09/03/12 1617  GLUCAP 82 80 85 92 100* 121*   Hemoglobin A1C: No results found for this basename: HGBA1C in the last 72 hours Fasting Lipid Panel: No results found for this basename: CHOL,HDL,LDLCALC,TRIG,CHOLHDL,LDLDIRECT in the last 72 hours Thyroid Function Tests: No results found for this basename: TSH,T4TOTAL,FREET4,T3FREE,THYROIDAB in the last 72 hours Anemia Panel: No results found for this basename: VITAMINB12,FOLATE,FERRITIN,TIBC,IRON,RETICCTPCT in the last 72 hours Coagulation: No results found for this basename: LABPROT:2,INR:2 in the last 72 hours Urine Drug Screen: Drugs of Abuse  No results found for this basename: labopia, cocainscrnur, labbenz, amphetmu, thcu, labbarb    Alcohol Level: No results found for this basename: ETH:2 in the last 72 hours Urinalysis: No results found for this basename: COLORURINE:2,APPERANCEUR:2,LABSPEC:2,PHURINE:2,GLUCOSEU:2,HGBUR:2,BILIRUBINUR:2,KETONESUR:2,PROTEINUR:2,UROBILINOGEN:2,NITRITE:2,LEUKOCYTESUR:2 in the last 72 hours Misc. Labs:  ABGS  Basename 09/03/12 0618  PHART 7.303*  PO2ART 65.1*  TCO2 27.0  HCO3 28.4*   CULTURES Recent Results (from the past 240 hour(s))  CULTURE, BLOOD (ROUTINE X 2)     Status: Normal (Preliminary result)   Collection Time   08/31/12  8:54 PM      Component Value Range Status Comment   Specimen Description BLOOD LEFT ANTECUBITAL   Final    Special Requests BOTTLES DRAWN AEROBIC AND ANAEROBIC 6CC EACH   Final    Culture NO GROWTH 4 DAYS  Final    Report Status PENDING   Incomplete   CULTURE, BLOOD (ROUTINE X 2)     Status: Normal (Preliminary result)   Collection Time   08/31/12  8:54 PM      Component Value Range Status Comment   Specimen Description BLOOD PORTA CATH   Final    Special Requests     Final    Value: BOTTLES DRAWN AEROBIC AND ANAEROBIC AEB 10CC ANA 5CC   Culture NO GROWTH 4 DAYS    Final    Report Status PENDING   Incomplete   MRSA PCR SCREENING     Status: Normal   Collection Time   08/31/12  9:03 PM      Component Value Range Status Comment   MRSA by PCR NEGATIVE  NEGATIVE Final    Studies/Results: No results found.  Medications:  Prior to Admission:  Prescriptions prior to admission  Medication Sig Dispense Refill  . acetaminophen (TYLENOL) 325 MG tablet Take 650 mg by mouth every 6 (six) hours as needed. fever      . amLODipine (NORVASC) 10 MG tablet Take 1 tablet (10 mg total) by mouth daily.  30 tablet  0  . docusate sodium (COLACE) 100 MG capsule Take 100 mg by mouth 2 (two) times daily as needed. For constipation      . enalapril (VASOTEC) 2.5 MG tablet Take 2.5 mg by mouth daily.      Marland Kitchen HYDROcodone-acetaminophen (NORCO) 10-325 MG per tablet Take 1 tablet by mouth 4 (four) times daily as needed. For pain      . insulin NPH-insulin regular (NOVOLIN 70/30) (70-30) 100 UNIT/ML injection Inject 60 Units into the skin 2 (two) times daily.      . metoprolol (LOPRESSOR) 50 MG tablet Take 1 tablet (50 mg total) by mouth 2 (two) times daily.  60 tablet  0  . ondansetron (ZOFRAN) 8 MG tablet Take 8 mg by mouth 4 (four) times daily as needed. For nausea      . potassium chloride 20 MEQ/15ML (10%) solution Take 20 mEq by mouth 2 (two) times daily.       Marland Kitchen torsemide (DEMADEX) 100 MG tablet Take 50 mg by mouth 2 (two) times daily.        Scheduled:   . amLODipine  10 mg Oral Daily  . antiseptic oral rinse  15 mL Mouth Rinse BID  . antiseptic oral rinse  15 mL Mouth Rinse q12n4p  . ceFTAROline (TEFLARO) IV  600 mg Intravenous Q12H  . docusate sodium  100 mg Oral BID  . enoxaparin (LOVENOX) injection  90 mg Subcutaneous Q24H  . insulin aspart  0-20 Units Subcutaneous TID WC  . insulin aspart  0-5 Units Subcutaneous QHS  . insulin aspart  6 Units Subcutaneous TID WC  . insulin glargine  30 Units Subcutaneous QHS  . metoprolol  50 mg Oral BID  . moxifloxacin  400 mg  Intravenous Q24H  . sodium chloride       Continuous:   . DISCONTD: sodium chloride 50 mL/hr at 09/05/12 0600   WUJ:[id number], acetaminophen, albuterol, alum & mag hydroxide-simeth, HYDROcodone-acetaminophen, HYDROmorphone (DILAUDID) injection, ondansetron (ZOFRAN) IV, ondansetron, promethazine  Assesment: She is much improved. Her cellulitis is better. Her renal function is better. Her breathing is better. Active Problems:  Lymphedema of lower extremity  Cellulitis of left leg  Diabetes mellitus  Morbid obesity  Acute renal failure  HTN (hypertension)  Obesity hypoventilation syndrome  Community acquired pneumonia  Plan: Transfer from ICU to telemetry    LOS: 5 days   Amedeo Detweiler L 09/05/2012, 8:16 AM

## 2012-09-05 NOTE — Progress Notes (Signed)
Patient refused cpap tonight.

## 2012-09-05 NOTE — Progress Notes (Signed)
Pt to be transferred per Md order. Pt transferred via wheelchair. Report called to RN.

## 2012-09-05 NOTE — Progress Notes (Signed)
INITIAL ADULT NUTRITION ASSESSMENT Date: 09/05/2012   Time: 12:46 PM Reason for Assessment: Poor PO and cellulitis   ASSESSMENT: Female 46 y.o.  Dx: cellulitis  INTERVENTION: 1. I have encouraged the patient to increase PO intake and consume adequate amounts of protein to help with wound healing.  2. Will order patient high protein snacks BID.  3. RD to follow for nutrition plan of care.   Hx:  Past Medical History  Diagnosis Date  . Diabetes mellitus   . Chronic pain   . Lymph edema   . Cellulitis   . Chiari malformation     s/p surgery  . S/P colonoscopy 05/26/2007    Dr. Laural Golden sigmoid diverticulosis random biopsies benign  . CAD (coronary artery disease)   . Hypercholesterolemia   . Hypertension   . Asthma   . S/P endoscopy 05/01/2009    Dr. Penelope Coop pill-induced esophageal ulcerations distal to midesophagus, 2 small ulcers in the antrum of the stomach    Related Meds:  Scheduled Meds:   . amLODipine  10 mg Oral Daily  . antiseptic oral rinse  15 mL Mouth Rinse BID  . ceFTAROline (TEFLARO) IV  600 mg Intravenous Q12H  . docusate sodium  100 mg Oral BID  . enoxaparin (LOVENOX) injection  90 mg Subcutaneous Q24H  . insulin aspart  0-20 Units Subcutaneous TID WC  . insulin aspart  0-5 Units Subcutaneous QHS  . insulin aspart  6 Units Subcutaneous TID WC  . insulin glargine  30 Units Subcutaneous QHS  . metoprolol  50 mg Oral BID  . moxifloxacin  400 mg Oral Q24H  . sodium chloride      . DISCONTD: antiseptic oral rinse  15 mL Mouth Rinse q12n4p  . DISCONTD: moxifloxacin  400 mg Intravenous Q24H   Continuous Infusions:   . DISCONTD: sodium chloride Stopped (09/05/12 0830)   PRN Meds:.acetaminophen, acetaminophen, albuterol, alum & mag hydroxide-simeth, HYDROcodone-acetaminophen, HYDROmorphone (DILAUDID) injection, ondansetron (ZOFRAN) IV, ondansetron, promethazine    Ht: _0  (165.1 cm)  Wt: 388 lb 0.2 oz (176 kg)  Ideal Wt: 57 kg % Ideal Wt: 310% Wt  Readings from Last 10 Encounters:  09/05/12 388 lb 0.2 oz (176 kg)  07/08/12 375 lb 10.6 oz (170.4 kg)  07/08/12 375 lb 10.6 oz (170.4 kg)  03/08/12 358 lb 14.5 oz (162.8 kg)  02/05/12 335 lb (151.955 kg)  11/21/11 330 lb 0.5 oz (149.7 kg)  10/26/11 330 lb (149.687 kg)    Body mass index is 64.57 kg/(m^2). (Extreme Obesity class III)  Food/Nutrition Related Hx: Patient reported her appetite has been poor over the past week. PO intake documented 25% at meals on carb modified diet. Patient reported she was eating less than 25% of her meals a few days PTA. Noted patient with marked lymph edema per MD.   Labs:  CMP     Component Value Date/Time   NA 140 09/05/2012 0422   K 4.3 09/05/2012 0422   CL 104 09/05/2012 0422   CO2 30 09/05/2012 0422   GLUCOSE 75 09/05/2012 0422   BUN 28* 09/05/2012 0422   CREATININE 1.50* 09/05/2012 0422   CALCIUM 8.7 09/05/2012 0422   PROT 7.0 09/04/2012 0500   ALBUMIN 2.7* 09/04/2012 0500   AST 21 09/04/2012 0500   ALT 20 09/04/2012 0500   ALKPHOS 97 09/04/2012 0500   BILITOT 0.3 09/04/2012 0500   GFRNONAA 41* 09/05/2012 0422   GFRAA 47* 09/05/2012 0422     Intake/Output Summary (Last 24  hours) at 09/05/12 1249 Last data filed at 09/05/12 5176  Gross per 24 hour  Intake   2069 ml  Output   1800 ml  Net    269 ml     Diet Order: Carb Control  Supplements/Tube Feeding: none at this time   IVF:    DISCONTD: sodium chloride Last Rate: Stopped (09/05/12 0830)    Estimated Nutritional Needs:   Kcal: 1420-1700 Protein: 141-176 grams  Fluid: 1 ml per kcal intake   NUTRITION DIAGNOSIS: -Increased nutrient needs (NI-5.1).  Status: Ongoing  Inadequate oral intake r/t poor appetite a/e/b PO intake 25% at meals   RELATED TO: wound healing  AS EVIDENCE BY: cellulitis of left leg  MONITORING/EVALUATION(Goals): PO intake, weights, labs, skin 1. PO intake > 50% at meals and snacks.  EDUCATION NEEDS: -No education needs identified at this  time  INTERVENTION: 1. I have encouraged the patient to increase PO intake and consume adequate amounts of protein to help with wound healing.  2. Will order patient high protein snacks BID.  3. RD to follow for nutrition plan of care.   Dietitian [phone]  DOCUMENTATION CODES Per approved criteria  -Morbid Obesity    Loyce Dys [facility] 09/05/2012, 12:46 PM

## 2012-09-05 NOTE — Progress Notes (Signed)
Subjective: Interval History: has no complaint of difficulty breathing. She said she is feeling better today and she doesn't have any nausea vomiting..  Objective: Vital signs in last 24 hours: Temp:  [97.8 F (36.6 C)-98.9 F (37.2 C)] 97.9 F (36.6 C) (09/17 0414) Pulse Rate:  [63-75] 66  (09/17 0600) Resp:  [18-27] 27  (09/17 0600) BP: (114-151)/(58-86) 136/65 mmHg (09/17 0600) SpO2:  [92 %-100 %] 96 % (09/17 0600) Weight:  [176 kg (388 lb 0.2 oz)] 176 kg (388 lb 0.2 oz) (09/17 0414) Weight change:   Intake/Output from previous day: 09/16 0701 - 09/17 0700 In: 2519 [P.O.:540; I.V.:1225; IV Piggyback:754] Out: 1800 [Urine:1800] Intake/Output this shift:    General appearance: alert, cooperative and no distress Resp: clear to auscultation bilaterally Cardio: regular rate and rhythm, S1, S2 normal, no murmur, click, rub or gallop GI: Obese and difficult to palpate organomegaly. Extremities: edema Possibly 1-2+ edema.  Lab Results:  [facility] 09/03/12 0512  WBC 7.9  HGB 10.3*  HCT 32.4*  PLT 290   BMET:  Basename 09/05/12 0422 09/04/12 0500  NA 140 139  K 4.3 4.2  CL 104 103  CO2 30 27  GLUCOSE 75 88  BUN 28* 39*  CREATININE 1.50* 1.99*  CALCIUM 8.7 8.6   No results found for this basename: PTH:2 in the last 72 hours Iron Studies: No results found for this basename: IRON,TIBC,TRANSFERRIN,FERRITIN in the last 72 hours  Studies/Results: No results found.  I have reviewed the patient's current medications.  Assessment/Plan: Problem #1 acute kidney injury superimposed on chronic her BUN is 28 creatinine is 1.5 renal function has improved progressively. Problem #2 hypertension blood pressure seems to be reasonably controlled Problem #3 hyperkalemia potassium is 4.3 normalized. Problem #4 history of anemia Problem #5 history of morbid obesity Problem #6 history of cellulitis Problem #7 is to diabetes Plan: We'll DC IV fluid and will continue his other  medications as before.    LOS: 5 days   Bessy Reaney S 09/05/2012,7:40 AM   He is a

## 2012-09-06 LAB — BASIC METABOLIC PANEL
BUN: 22 mg/dL (ref 6–23)
Calcium: 8.8 mg/dL (ref 8.4–10.5)
Creatinine, Ser: 1.37 mg/dL — ABNORMAL HIGH (ref 0.50–1.10)
GFR calc non Af Amer: 45 mL/min — ABNORMAL LOW (ref >90)
Glucose, Bld: 141 mg/dL — ABNORMAL HIGH (ref 70–99)
Potassium: 4.6 mEq/L (ref 3.5–5.1)

## 2012-09-06 LAB — GLUCOSE, CAPILLARY: Glucose-Capillary: 121 mg/dL — ABNORMAL HIGH (ref 70–99)

## 2012-09-06 MED ORDER — METOCLOPRAMIDE HCL 10 MG PO TABS
5.0000 mg | ORAL_TABLET | Freq: Three times a day (TID) | ORAL | Status: DC
  Administered 2012-09-06 – 2012-09-09 (×11): 5 mg via ORAL
  Filled 2012-09-06 (×11): qty 1

## 2012-09-06 MED ORDER — SODIUM CHLORIDE 0.9 % IJ SOLN
INTRAMUSCULAR | Status: AC
  Filled 2012-09-06: qty 3

## 2012-09-06 NOTE — Progress Notes (Signed)
Patient was asked if she wanted to try to wear her BIPAP unit again tonight and she refused;will continue to monitor via pulse ox.

## 2012-09-06 NOTE — Progress Notes (Signed)
Subjective: She says she feels better. She is able to eat a little more. She has no other new complaints. Her legs are better  Objective: Vital signs in last 24 hours: Temp:  [97.9 F (36.6 C)-98.5 F (36.9 C)] 98.5 F (36.9 C) (09/18 6767) Pulse Rate:  [67-77] 67  (09/18 0608) Resp:  [20-28] 20  (09/18 0608) BP: (129-158)/(73-88) 147/84 mmHg (09/18 0608) SpO2:  [90 %-93 %] 93 % (09/18 2094) Weight change:  Last BM Date: 09/05/12  Intake/Output from previous day: 09/17 0701 - 09/18 0700 In: 867 [P.O.:490; I.V.:125; IV Piggyback:252] Out: 350 [Urine:350]  PHYSICAL EXAM General appearance: alert, cooperative, mild distress and morbidly obese Resp: rhonchi bilaterally Cardio: regular rate and rhythm, S1, S2 normal, no murmur, click, rub or gallop GI: soft, non-tender; bowel sounds normal; no masses,  no organomegaly Extremities: Lymphedema but much less cellulitis  Lab Results:    Basic Metabolic Panel:  Basename 09/06/12 0455 09/05/12 0422  NA 140 140  K 4.6 4.3  CL 103 104  CO2 30 30  GLUCOSE 141* 75  BUN 22 28*  CREATININE 1.37* 1.50*  CALCIUM 8.8 8.7  MG -- --  PHOS -- --   Liver Function Tests:  Basename 09/04/12 0500  AST 21  ALT 20  ALKPHOS 97  BILITOT 0.3  PROT 7.0  ALBUMIN 2.7*   No results found for this basename: LIPASE:2,AMYLASE:2 in the last 72 hours No results found for this basename: AMMONIA:2 in the last 72 hours CBC: No results found for this basename: WBC:2,NEUTROABS:2,HGB:2,HCT:2,MCV:2,PLT:2 in the last 72 hours Cardiac Enzymes: No results found for this basename: CKTOTAL:3,CKMB:3,CKMBINDEX:3,TROPONINI:3 in the last 72 hours BNP: No results found for this basename: PROBNP:3 in the last 72 hours D-Dimer: No results found for this basename: DDIMER:2 in the last 72 hours CBG:  Basename 09/06/12 0725 09/05/12 2208 09/05/12 1613 09/05/12 1148 09/05/12 0717 09/04/12 2151  GLUCAP 121* 145* 136* 94 84 82   Hemoglobin A1C: No results  found for this basename: HGBA1C in the last 72 hours Fasting Lipid Panel: No results found for this basename: CHOL,HDL,LDLCALC,TRIG,CHOLHDL,LDLDIRECT in the last 72 hours Thyroid Function Tests: No results found for this basename: TSH,T4TOTAL,FREET4,T3FREE,THYROIDAB in the last 72 hours Anemia Panel: No results found for this basename: VITAMINB12,FOLATE,FERRITIN,TIBC,IRON,RETICCTPCT in the last 72 hours Coagulation: No results found for this basename: LABPROT:2,INR:2 in the last 72 hours Urine Drug Screen: Drugs of Abuse  No results found for this basename: labopia, cocainscrnur, labbenz, amphetmu, thcu, labbarb    Alcohol Level: No results found for this basename: ETH:2 in the last 72 hours Urinalysis: No results found for this basename: COLORURINE:2,APPERANCEUR:2,LABSPEC:2,PHURINE:2,GLUCOSEU:2,HGBUR:2,BILIRUBINUR:2,KETONESUR:2,PROTEINUR:2,UROBILINOGEN:2,NITRITE:2,LEUKOCYTESUR:2 in the last 72 hours Misc. Labs:  ABGS No results found for this basename: PHART,PCO2,PO2ART,TCO2,HCO3 in the last 72 hours CULTURES Recent Results (from the past 240 hour(s))  CULTURE, BLOOD (ROUTINE X 2)     Status: Normal   Collection Time   08/31/12  8:54 PM      Component Value Range Status Comment   Specimen Description BLOOD LEFT ANTECUBITAL   Final    Special Requests BOTTLES DRAWN AEROBIC AND ANAEROBIC [place]   Final    Culture NO GROWTH 5 DAYS   Final    Report Status 09/05/2012 FINAL   Final   CULTURE, BLOOD (ROUTINE X 2)     Status: Normal   Collection Time   08/31/12  8:54 PM      Component Value Range Status Comment   Specimen Description BLOOD PORTA CATH DRAWN BY  RN   Final    Special Requests     Final    Value: BOTTLES DRAWN AEROBIC AND ANAEROBIC AEB=10CC ANA=5CC   Culture NO GROWTH 5 DAYS   Final    Report Status 09/05/2012 FINAL   Final   MRSA PCR SCREENING     Status: Normal   Collection Time   08/31/12  9:03 PM      Component Value Range Status Comment   MRSA by PCR NEGATIVE   NEGATIVE Final    Studies/Results: No results found.  Medications:  Prior to Admission:  Prescriptions prior to admission  Medication Sig Dispense Refill  . acetaminophen (TYLENOL) 325 MG tablet Take 650 mg by mouth every 6 (six) hours as needed. fever      . amLODipine (NORVASC) 10 MG tablet Take 1 tablet (10 mg total) by mouth daily.  30 tablet  0  . docusate sodium (COLACE) 100 MG capsule Take 100 mg by mouth 2 (two) times daily as needed. For constipation      . enalapril (VASOTEC) 2.5 MG tablet Take 2.5 mg by mouth daily.      Marland Kitchen HYDROcodone-acetaminophen (NORCO) 10-325 MG per tablet Take 1 tablet by mouth 4 (four) times daily as needed. For pain      . insulin NPH-insulin regular (NOVOLIN 70/30) (70-30) 100 UNIT/ML injection Inject 60 Units into the skin 2 (two) times daily.      . metoprolol (LOPRESSOR) 50 MG tablet Take 1 tablet (50 mg total) by mouth 2 (two) times daily.  60 tablet  0  . ondansetron (ZOFRAN) 8 MG tablet Take 8 mg by mouth 4 (four) times daily as needed. For nausea      . potassium chloride 20 MEQ/15ML (10%) solution Take 20 mEq by mouth 2 (two) times daily.       Marland Kitchen torsemide (DEMADEX) 100 MG tablet Take 50 mg by mouth 2 (two) times daily.        Scheduled:   . amLODipine  10 mg Oral Daily  . antiseptic oral rinse  15 mL Mouth Rinse BID  . ceFTAROline (TEFLARO) IV  600 mg Intravenous Q12H  . docusate sodium  100 mg Oral BID  . enoxaparin (LOVENOX) injection  90 mg Subcutaneous Q24H  . fluconazole  100 mg Oral Daily  . insulin aspart  0-20 Units Subcutaneous TID WC  . insulin aspart  0-5 Units Subcutaneous QHS  . insulin aspart  6 Units Subcutaneous TID WC  . insulin glargine  30 Units Subcutaneous QHS  . metoprolol  50 mg Oral BID  . moxifloxacin  400 mg Oral Q24H  . DISCONTD: antiseptic oral rinse  15 mL Mouth Rinse q12n4p  . DISCONTD: moxifloxacin  400 mg Intravenous Q24H   Continuous:  IRC:[id number], acetaminophen, albuterol, alum & mag  hydroxide-simeth, HYDROcodone-acetaminophen, HYDROmorphone (DILAUDID) injection, ondansetron (ZOFRAN) IV, ondansetron, promethazine  Assesment: She was admitted with cellulitis of the leg. She also had pneumonia. She has been having problems with acute renal failure. Her appetite is not good. She has morbid obesity. She has chronic lymphedema which is unchanged. She has diabetes which is doing fairly well Active Problems:  Lymphedema of lower extremity  Cellulitis of left leg  Diabetes mellitus  Morbid obesity  Acute renal failure  HTN (hypertension)  Obesity hypoventilation syndrome  Community acquired pneumonia  Nausea & vomiting  Hyperkalemia, diminished renal excretion    Plan: I would like to see her able to eat better before she goes home. There  apparently was some sort of problem with the Port-A-Cath last night I will see how that works today    LOS: 6 days   [name] 09/06/2012, 9:13 AM

## 2012-09-06 NOTE — Progress Notes (Signed)
[name]  MRN: [id number]  DOB/AGE: 05/05/1966 46 y.o.  Primary Care Physician:HAWKINS,EDWARD L, MD  Admit date: 08/31/2012  Chief Complaint:  Chief Complaint  Patient presents with  . Fever    S-Pt presented on  08/31/2012 with  Chief Complaint  Patient presents with  . Fever  .    Pt c/onausea this am -otherwise feeling better overall.   Meds    . amLODipine  10 mg Oral Daily  . antiseptic oral rinse  15 mL Mouth Rinse BID  . ceFTAROline (TEFLARO) IV  600 mg Intravenous Q12H  . docusate sodium  100 mg Oral BID  . enoxaparin (LOVENOX) injection  90 mg Subcutaneous Q24H  . fluconazole  100 mg Oral Daily  . insulin aspart  0-20 Units Subcutaneous TID WC  . insulin aspart  0-5 Units Subcutaneous QHS  . insulin aspart  6 Units Subcutaneous TID WC  . insulin glargine  30 Units Subcutaneous QHS  . metoprolol  50 mg Oral BID  . moxifloxacin  400 mg Oral Q24H  . DISCONTD: antiseptic oral rinse  15 mL Mouth Rinse q12n4p  . DISCONTD: moxifloxacin  400 mg Intravenous Q24H      Physical Exam: Vital signs in last 24 hours: Temp:  [97.9 F (36.6 C)-98.5 F (36.9 C)] 98.5 F (36.9 C) (09/18 0947) Pulse Rate:  [67-77] 67  (09/18 0608) Resp:  [20-22] 20  (09/18 0608) BP: (129-158)/(73-88) 147/84 mmHg (09/18 0608) SpO2:  [90 %-93 %] 93 % (09/18 0962) Weight change:  Last BM Date: 09/05/12  Intake/Output from previous day: 09/17 0701 - 09/18 0700 In: 867 [P.O.:490; I.V.:125; IV Piggyback:252] Out: 350 [Urine:350]     Physical Exam: General- pt is awake,alert, oriented to time place and person Resp- No acute REsp distress, decreased breath sounds at bases. CVS- S1S2 regular in rate and rhythm GIT- BS+, soft, NT, ND,Mobidly obese EXT- chronic edema  Lab Results:  BMET  Basename 09/06/12 0455 09/05/12 0422  NA 140 140  K 4.6 4.3  CL 103 104  CO2 30 30  GLUCOSE 141* 75  BUN 22 28*  CREATININE 1.37* 1.50*  CALCIUM 8.8 8.7   TRend Creat  2.99===>1.99==>1.5==>1.37   MICRO Recent Results (from the past 240 hour(s))  CULTURE, BLOOD (ROUTINE X 2)     Status: Normal   Collection Time   08/31/12  8:54 PM      Component Value Range Status Comment   Specimen Description BLOOD LEFT ANTECUBITAL   Final    Special Requests BOTTLES DRAWN AEROBIC AND ANAEROBIC [place]   Final    Culture NO GROWTH 5 DAYS   Final    Report Status 09/05/2012 FINAL   Final   CULTURE, BLOOD (ROUTINE X 2)     Status: Normal   Collection Time   08/31/12  8:54 PM      Component Value Range Status Comment   Specimen Description BLOOD PORTA CATH DRAWN BY RN   Final    Special Requests     Final    Value: BOTTLES DRAWN AEROBIC AND ANAEROBIC AEB=10CC ANA=5CC   Culture NO GROWTH 5 DAYS   Final    Report Status 09/05/2012 FINAL   Final   MRSA PCR SCREENING     Status: Normal   Collection Time   08/31/12  9:03 PM      Component Value Range Status Comment   MRSA by PCR NEGATIVE  NEGATIVE Final       Lab Results  Component Value Date   CALCIUM 8.8 09/06/2012   PHOS 4.4 09/03/2012          Impression: 1)Renal  AKI secondary to Prerenal                AKI on CKD                AKI now improving-creat down to 1.37                CKD stage 3.               CKD since  2012               CKD secondary to DM/HTN                Progression of CKD marked with AKI                  2)HTN Target Organ damage  CKD CAD  Medication-  On Calcium Channel Blockers On Beta blockers  3)Anemia HGb at goal (9--11)   4)CKD Mineral-Bone Disorder PTH not avail . Secondary Hyperparathyroidism w/u pending  Phosphorus at goal. Vitamin 25-OH will check.  5)Hyperkalemia  6)ID- cellulitis  7)Acid base Co2 at goal     Plan:  Will ask for CKD-BMd labs Will follow [facility] Will continue current tx       Mahki Spikes S 09/06/2012, 10:46 AM

## 2012-09-06 NOTE — Progress Notes (Signed)
Attempted to draw blood from [facility] to Left chest with little blood returned, reaccessed PAC, little blood return noted, flushes well, patient denies any discomfort, will report off to day shift nurse to follow up on.

## 2012-09-07 LAB — CBC
HCT: 32.9 % — ABNORMAL LOW (ref 36.0–46.0)
Hemoglobin: 10.2 g/dL — ABNORMAL LOW (ref 12.0–15.0)
MCH: 27.5 pg (ref 26.0–34.0)
MCHC: 31 g/dL (ref 30.0–36.0)
MCV: 88.7 fL (ref 78.0–100.0)
Platelets: 259 10*3/uL (ref 150–400)
RBC: 3.71 MIL/uL — ABNORMAL LOW (ref 3.87–5.11)
RDW: 16.7 % — ABNORMAL HIGH (ref 11.5–15.5)
WBC: 8.7 10*3/uL (ref 4.0–10.5)

## 2012-09-07 LAB — GLUCOSE, CAPILLARY
Glucose-Capillary: 85 mg/dL (ref 70–99)
Glucose-Capillary: 109 mg/dL — ABNORMAL HIGH (ref 70–99)

## 2012-09-07 LAB — BASIC METABOLIC PANEL
BUN: 23 mg/dL (ref 6–23)
CO2: 29 mEq/L (ref 19–32)
Calcium: 9 mg/dL (ref 8.4–10.5)
Chloride: 104 mEq/L (ref 96–112)
Creatinine, Ser: 1.4 mg/dL — ABNORMAL HIGH (ref 0.50–1.10)
GFR calc Af Amer: 51 mL/min — ABNORMAL LOW (ref >90)
GFR calc non Af Amer: 44 mL/min — ABNORMAL LOW (ref >90)
Glucose, Bld: 93 mg/dL (ref 70–99)
Potassium: 4.4 mEq/L (ref 3.5–5.1)
Sodium: 139 mEq/L (ref 135–145)

## 2012-09-07 LAB — VITAMIN D 25 HYDROXY (VIT D DEFICIENCY, FRACTURES): Vit D, 25-Hydroxy: <10 ng/mL — ABNORMAL LOW (ref 30–89)

## 2012-09-07 LAB — PTH, INTACT AND CALCIUM
Calcium, Total (PTH): 8.5 mg/dL (ref 8.4–10.5)
PTH: 60.6 pg/mL (ref 14.0–72.0)

## 2012-09-07 MED ORDER — NYSTATIN 100000 UNIT/ML MT SUSP
5.0000 mL | Freq: Four times a day (QID) | OROMUCOSAL | Status: DC
  Administered 2012-09-07 – 2012-09-09 (×6): 500000 [IU] via ORAL
  Filled 2012-09-07 (×6): qty 5

## 2012-09-07 MED ORDER — TORSEMIDE 20 MG PO TABS
20.0000 mg | ORAL_TABLET | Freq: Once | ORAL | Status: AC
  Administered 2012-09-07: 20 mg via ORAL
  Filled 2012-09-07: qty 1

## 2012-09-07 MED ORDER — SODIUM CHLORIDE 0.9 % IJ SOLN
INTRAMUSCULAR | Status: AC
  Administered 2012-09-07: 02:00:00
  Filled 2012-09-07: qty 3

## 2012-09-07 NOTE — Progress Notes (Signed)
Subjective: Interval History: has complaints occasional dry cough. She denies any fever chills or sweating. Patient also denies any nausea no vomiting..  Objective: Vital signs in last 24 hours: Temp:  [98 F (36.7 C)-98.6 F (37 C)] 98.1 F (36.7 C) (09/19 0642) Pulse Rate:  [64-70] 70  (09/19 0642) Resp:  [20] 20  (09/19 0642) BP: (122-136)/(75-87) 134/87 mmHg (09/19 0642) SpO2:  [90 %-94 %] 90 % (09/19 0642) Weight change:   Intake/Output from previous day: 09/18 0701 - 09/19 0700 In: 600 [P.O.:600] Out: -  Intake/Output this shift:   Her generally patient is alert in no apparent distress. Chest decreased breath sound bilaterally she doesn't have any wheezing. Heart exam revealed regular rate and rhythm distant S1-S2 no murmur. Abdomen obese nontender and difficult to palpate organomegaly.  Extremities she has possibly 1+ edema.   Lab Results:  [facility] 09/07/12 0441  WBC 8.7  HGB 10.2*  HCT 32.9*  PLT 259   BMET:  Basename 09/07/12 0441 09/06/12 0455  NA 139 140  K 4.4 4.6  CL 104 103  CO2 29 30  GLUCOSE 93 141*  BUN 23 22  CREATININE 1.40* 1.37*  CALCIUM 9.0 8.8   No results found for this basename: PTH:2 in the last 72 hours Iron Studies: No results found for this basename: IRON,TIBC,TRANSFERRIN,FERRITIN in the last 72 hours  Studies/Results: No results found.  I have reviewed the patient's current medications.  Assessment/Plan: Problem #1 acute kidney injury superimposed on chronic her BUN is 23 creatinine is 1.4 her renal function has returned to her baseline. Presently her patient doesn't have any uremic sign and symptoms. Problem #2 hyperkalemia potassium is 4.4 has resolved Problem #3 anemia her hemoglobin is 10.2 hematocrit 32.9 he seems to be a compression of iron deficiency anemia and anemia of chronic disease. Problem #4 morbid obesity Problem #5 history of diabetes Problem #6 history of chronic lymphedema of lower extremities Problem #7  history of hypertension blood pressure seems to be reasonably controlled. Plan: Continue with oral diuretics Since her her renal function has returned to her baseline I will sign off.   Tthank you    LOS: 7 days   Kazuki Ingle S 09/07/2012,7:39 AM

## 2012-09-08 LAB — GLUCOSE, CAPILLARY
Glucose-Capillary: 81 mg/dL (ref 70–99)
Glucose-Capillary: 110 mg/dL — ABNORMAL HIGH (ref 70–99)
Glucose-Capillary: 141 mg/dL — ABNORMAL HIGH (ref 70–99)
Glucose-Capillary: 80 mg/dL (ref 70–99)

## 2012-09-08 MED ORDER — SODIUM CHLORIDE 0.9 % IJ SOLN
10.0000 mL | Freq: Two times a day (BID) | INTRAMUSCULAR | Status: DC
  Administered 2012-09-08: 20 mL
  Administered 2012-09-08 – 2012-09-09 (×2): 10 mL
  Filled 2012-09-08: qty 3
  Filled 2012-09-08: qty 6

## 2012-09-08 MED ORDER — ALTEPLASE 2 MG IJ SOLR
2.0000 mg | Freq: Once | INTRAMUSCULAR | Status: AC
  Administered 2012-09-08: 2 mg
  Filled 2012-09-08: qty 2

## 2012-09-08 MED ORDER — STERILE WATER FOR INJECTION IJ SOLN
2.2000 mL | Freq: Once | INTRAMUSCULAR | Status: AC
  Administered 2012-09-08: 2.2 mL via INTRAMUSCULAR

## 2012-09-08 MED ORDER — SODIUM CHLORIDE 0.9 % IJ SOLN
10.0000 mL | INTRAMUSCULAR | Status: DC | PRN
  Administered 2012-09-08 – 2012-09-09 (×2): 10 mL
  Filled 2012-09-08: qty 3

## 2012-09-08 NOTE — Progress Notes (Signed)
Pt refused Bipap device

## 2012-09-08 NOTE — Progress Notes (Signed)
Patient refused to wear CPAP again;will continue to monitor patient through the night.

## 2012-09-08 NOTE — Progress Notes (Signed)
Inserted cathflo x 2 doses into port a cath due to unable to withdraw blood, doses dwelled x 30 minutes each time, unable to withdraw blood after each dose, flush was sluggish, but able to flush.

## 2012-09-08 NOTE — Progress Notes (Signed)
Subjective: She does not feel well. She is having trouble with her Port-A-Cath.she is still nauseated  Objective: Vital signs in last 24 hours: Temp:  [98.2 F (36.8 C)-98.4 F (36.9 C)] 98.4 F (36.9 C) (09/20 1131) Pulse Rate:  [66-73] 66  (09/20 1131) Resp:  [20] 20  (09/20 1131) BP: (127-135)/(77-84) 132/77 mmHg (09/20 1131) SpO2:  [89 %-90 %] 90 % (09/20 1131) Weight change:  Last BM Date: 09/05/12  Intake/Output from previous day: 09/19 0701 - 09/20 0700 In: 1212 [P.O.:960; IV Piggyback:252] Out: -   PHYSICAL EXAM General appearance: alert, cooperative, no distress and morbidly obese Resp: clear to auscultation bilaterally Cardio: regular rate and rhythm, S1, S2 normal, no murmur, click, rub or gallop GI: soft, non-tender; bowel sounds normal; no masses,  no organomegaly Extremities: her cellulitis is better her lymphedema is unchanged  Lab Results:    Basic Metabolic Panel:  Basename 09/07/12 0441 09/06/12 1056  NA 139 --  K 4.4 --  CL 104 --  CO2 29 --  GLUCOSE 93 --  BUN 23 --  CREATININE 1.40* --  CALCIUM 9.0 8.5  MG -- --  PHOS -- --   Liver Function Tests: No results found for this basename: AST:2,ALT:2,ALKPHOS:2,BILITOT:2,PROT:2,ALBUMIN:2 in the last 72 hours No results found for this basename: LIPASE:2,AMYLASE:2 in the last 72 hours No results found for this basename: AMMONIA:2 in the last 72 hours CBC:  Basename 09/07/12 0441  WBC 8.7  NEUTROABS --  HGB 10.2*  HCT 32.9*  MCV 88.7  PLT 259   Cardiac Enzymes: No results found for this basename: CKTOTAL:3,CKMB:3,CKMBINDEX:3,TROPONINI:3 in the last 72 hours BNP: No results found for this basename: PROBNP:3 in the last 72 hours D-Dimer: No results found for this basename: DDIMER:2 in the last 72 hours CBG:  Basename 09/08/12 1242 09/08/12 0756 09/07/12 2108 09/07/12 1728 09/07/12 1113 09/07/12 0734  GLUCAP 110* 81 95 144* 109* 85   Hemoglobin A1C: No results found for this basename:  HGBA1C in the last 72 hours Fasting Lipid Panel: No results found for this basename: CHOL,HDL,LDLCALC,TRIG,CHOLHDL,LDLDIRECT in the last 72 hours Thyroid Function Tests: No results found for this basename: TSH,T4TOTAL,FREET4,T3FREE,THYROIDAB in the last 72 hours Anemia Panel: No results found for this basename: VITAMINB12,FOLATE,FERRITIN,TIBC,IRON,RETICCTPCT in the last 72 hours Coagulation: No results found for this basename: LABPROT:2,INR:2 in the last 72 hours Urine Drug Screen: Drugs of Abuse  No results found for this basename: labopia, cocainscrnur, labbenz, amphetmu, thcu, labbarb    Alcohol Level: No results found for this basename: ETH:2 in the last 72 hours Urinalysis: No results found for this basename: COLORURINE:2,APPERANCEUR:2,LABSPEC:2,PHURINE:2,GLUCOSEU:2,HGBUR:2,BILIRUBINUR:2,KETONESUR:2,PROTEINUR:2,UROBILINOGEN:2,NITRITE:2,LEUKOCYTESUR:2 in the last 72 hours Misc. Labs:  ABGS No results found for this basename: PHART,PCO2,PO2ART,TCO2,HCO3 in the last 72 hours CULTURES Recent Results (from the past 240 hour(s))  CULTURE, BLOOD (ROUTINE X 2)     Status: Normal   Collection Time   08/31/12  8:54 PM      Component Value Range Status Comment   Specimen Description BLOOD LEFT ANTECUBITAL   Final    Special Requests BOTTLES DRAWN AEROBIC AND ANAEROBIC [place]   Final    Culture NO GROWTH 5 DAYS   Final    Report Status 09/05/2012 FINAL   Final   CULTURE, BLOOD (ROUTINE X 2)     Status: Normal   Collection Time   08/31/12  8:54 PM      Component Value Range Status Comment   Specimen Description BLOOD PORTA CATH DRAWN BY RN   Final  Special Requests     Final    Value: BOTTLES DRAWN AEROBIC AND ANAEROBIC AEB=10CC ANA=5CC   Culture NO GROWTH 5 DAYS   Final    Report Status 09/05/2012 FINAL   Final   MRSA PCR SCREENING     Status: Normal   Collection Time   08/31/12  9:03 PM      Component Value Range Status Comment   MRSA by PCR NEGATIVE  NEGATIVE Final     Studies/Results: No results found.  Medications:  Prior to Admission:  Prescriptions prior to admission  Medication Sig Dispense Refill  . acetaminophen (TYLENOL) 325 MG tablet Take 650 mg by mouth every 6 (six) hours as needed. fever      . amLODipine (NORVASC) 10 MG tablet Take 1 tablet (10 mg total) by mouth daily.  30 tablet  0  . docusate sodium (COLACE) 100 MG capsule Take 100 mg by mouth 2 (two) times daily as needed. For constipation      . enalapril (VASOTEC) 2.5 MG tablet Take 2.5 mg by mouth daily.      Marland Kitchen HYDROcodone-acetaminophen (NORCO) 10-325 MG per tablet Take 1 tablet by mouth 4 (four) times daily as needed. For pain      . insulin NPH-insulin regular (NOVOLIN 70/30) (70-30) 100 UNIT/ML injection Inject 60 Units into the skin 2 (two) times daily.      . metoprolol (LOPRESSOR) 50 MG tablet Take 1 tablet (50 mg total) by mouth 2 (two) times daily.  60 tablet  0  . ondansetron (ZOFRAN) 8 MG tablet Take 8 mg by mouth 4 (four) times daily as needed. For nausea      . potassium chloride 20 MEQ/15ML (10%) solution Take 20 mEq by mouth 2 (two) times daily.       Marland Kitchen torsemide (DEMADEX) 100 MG tablet Take 50 mg by mouth 2 (two) times daily.        Scheduled:   . alteplase  2 mg Intracatheter Once  . alteplase  2 mg Intracatheter Once  . amLODipine  10 mg Oral Daily  . antiseptic oral rinse  15 mL Mouth Rinse BID  . ceFTAROline (TEFLARO) IV  600 mg Intravenous Q12H  . docusate sodium  100 mg Oral BID  . enoxaparin (LOVENOX) injection  90 mg Subcutaneous Q24H  . fluconazole  100 mg Oral Daily  . insulin aspart  0-20 Units Subcutaneous TID WC  . insulin aspart  0-5 Units Subcutaneous QHS  . insulin aspart  6 Units Subcutaneous TID WC  . insulin glargine  30 Units Subcutaneous QHS  . metoCLOPramide  5 mg Oral TID AC & HS  . metoprolol  50 mg Oral BID  . moxifloxacin  400 mg Oral Q24H  . nystatin  5 mL Oral QID  . sodium chloride  10-40 mL Intracatheter Q12H  . sterile water  (preservative free)  2.2 mL Injection Once   Continuous:  WJX:BJYNWGNFAOZHY, acetaminophen, albuterol, alum & mag hydroxide-simeth, HYDROcodone-acetaminophen, HYDROmorphone (DILAUDID) injection, ondansetron (ZOFRAN) IV, ondansetron, promethazine, sodium chloride  Assesment:she has cellulitis which is improving. She has lymphedema. She has pneumonia which is better Active Problems:  Lymphedema of lower extremity  Cellulitis of left leg  Diabetes mellitus  Morbid obesity  Acute renal failure  HTN (hypertension)  Obesity hypoventilation syndrome  Community acquired pneumonia  Nausea & vomiting  Hyperkalemia, diminished renal excretion    Plan:no change in treatments. Her surgeon will evaluate the PAC    LOS: 8 days  Timotheus Salm L 09/08/2012, 5:00 PM

## 2012-09-09 LAB — GLUCOSE, CAPILLARY: Glucose-Capillary: 96 mg/dL (ref 70–99)

## 2012-09-09 MED ORDER — HEPARIN SOD (PORK) LOCK FLUSH 100 UNIT/ML IV SOLN
500.0000 [IU] | INTRAVENOUS | Status: DC | PRN
  Administered 2012-09-09: 500 [IU]

## 2012-09-09 MED ORDER — SODIUM CHLORIDE 0.9 % IJ SOLN
INTRAMUSCULAR | Status: AC
  Filled 2012-09-09: qty 6

## 2012-09-09 MED ORDER — HEPARIN SOD (PORK) LOCK FLUSH 100 UNIT/ML IV SOLN
500.0000 [IU] | INTRAVENOUS | Status: DC
  Filled 2012-09-09: qty 5

## 2012-09-09 MED ORDER — CEFTAROLINE FOSAMIL 600 MG IV SOLR: 600.0000 mg | Freq: Two times a day (BID) | INTRAVENOUS | Status: DC

## 2012-09-09 NOTE — Discharge Summary (Signed)
Physician Discharge Summary  Patient ID: [name] MRN: [id number] DOB/AGE: 02/10/1966 46 y.o. Primary Care Physician:Alma Mohiuddin L, MD Admit date: 08/31/2012 Discharge date: 09/09/2012    Discharge Diagnoses:   Active Problems:  Lymphedema of lower extremity  Cellulitis of left leg  Diabetes mellitus  Morbid obesity  Acute renal failure  HTN (hypertension)  Obesity hypoventilation syndrome  Community acquired pneumonia  Nausea & vomiting  Hyperkalemia, diminished renal excretion     Medication List     As of 09/09/2012  9:03 AM    TAKE these medications         acetaminophen 325 MG tablet   Commonly known as: TYLENOL   Take 650 mg by mouth every 6 (six) hours as needed. fever      amLODipine 10 MG tablet   Commonly known as: NORVASC   Take 1 tablet (10 mg total) by mouth daily.      docusate sodium 100 MG capsule   Commonly known as: COLACE   Take 100 mg by mouth 2 (two) times daily as needed. For constipation      enalapril 2.5 MG tablet   Commonly known as: VASOTEC   Take 2.5 mg by mouth daily.      HYDROcodone-acetaminophen 10-325 MG per tablet   Commonly known as: NORCO   Take 1 tablet by mouth 4 (four) times daily as needed. For pain      insulin NPH-insulin regular (70-30) 100 UNIT/ML injection   Commonly known as: NOVOLIN 70/30   Inject 60 Units into the skin 2 (two) times daily.      metoprolol 50 MG tablet   Commonly known as: LOPRESSOR   Take 1 tablet (50 mg total) by mouth 2 (two) times daily.      ondansetron 8 MG tablet   Commonly known as: ZOFRAN   Take 8 mg by mouth 4 (four) times daily as needed. For nausea      potassium chloride 20 MEQ/15ML (10%) solution   Take 20 mEq by mouth 2 (two) times daily.      sodium chloride 0.9 % SOLN 250 mL with ceftaroline 600 MG SOLR 600 mg   Inject 600 mg into the vein every 12 (twelve) hours.      torsemide 100 MG tablet   Commonly known as: DEMADEX   Take 50 mg by mouth 2 (two) times  daily.        Discharged Condition: Improved    Consults: General surgery  Significant Diagnostic Studies: Dg Chest Port 1 View  09/03/2012  *RADIOLOGY REPORT*  Clinical Data: Follow-up pneumonia  PORTABLE CHEST - 1 VIEW  Comparison: 09/12  Findings: Left subclavian power port is unchanged with its tip in the SVC at the azygos level.  Artifact overlies chest.  Bilateral pulmonary infiltrates are improving.  No areas worsening or new findings.  IMPRESSION: Improving bilateral pulmonary infiltrates.   Original Report Authenticated By: Jules Schick, M.D.    Dg Chest Port 1 View  08/31/2012  *RADIOLOGY REPORT*  Clinical Data: 46 year old female with low oxygenation, fever.  PORTABLE CHEST - 1 VIEW  Comparison: 1013 hours the same day and earlier.  Findings : AP portable upright view 2021 hours.  Stable left chest Port-A- Cath.  Increased coarse bilateral basilar predominant pulmonary opacity, greater on the right. Stable cardiomegaly and mediastinal contours.  Continued low lung volumes.  IMPRESSION: Increased right greater than left coarse pulmonary opacity suspicious for bilateral pneumonia.   Original Report Authenticated By: H.LEE  Neale Burly, M.D.    Dg Chest Port 1 View  08/31/2012  *RADIOLOGY REPORT*  Clinical Data: Fever  PORTABLE CHEST - 1 VIEW  Comparison: 07/05/2012  Findings: Study is limited by patient's large body habitus. Cardiomegaly again noted.  No acute infiltrate or pulmonary edema. Mild basilar atelectasis.  Lung bases partially obscured by overlying chest wall tissue.  Stable left subclavian Port-A-Cath position with tip in SVC.  IMPRESSION: Limited study by patient's large body habitus.  Cardiomegaly again noted.  No acute infiltrate or pulmonary edema.  Mild basilar atelectasis.   Original Report Authenticated By: Lahoma Crocker, M.D.    US Abdomen Limited Ruq  09/01/2012  *RADIOLOGY REPORT*  Clinical Data: Nausea.  Evaluate gallbladder  LIMITED ABDOMINAL ULTRASOUND  Comparison:   None.  Findings: The gallbladder appears well distended.  Some internal specular echoes are identified suspicious for a small amount of sludge.  No cholelithiasis is evident.  No gallbladder wall thickening or pericholecystic fluid is seen.  The common bile duct measures 2.9 mm and has a normal appearance.  The liver is homogeneous in echotexture with no focal parenchymal abnormality noted.  The right lobe of the liver measures 22 cm in length and this may be due to the presence of a Reidel's lobe or indicate some hepatic enlargement.  No signs of intrahepatic ductal dilatation are seen.  No right upper quadrant fluid is noted.  Limited views of the right kidney shows no signs of hydronephrosis. Limited views of the pancreatic head and body appear normal in size and echotexture  IMPRESSION: Findings suggestive of a small amount of gallbladder sludge with no evidence for cholelithiasis or cholecystitis.  Prominent right lobe of the liver question hepatic enlargement or Reidel's lobe.   Original Report Authenticated By: Ander Gaster, M.D.     Lab Results: Basic Metabolic Panel:  Basename 09/07/12 0441 09/06/12 1056  NA 139 --  K 4.4 --  CL 104 --  CO2 29 --  GLUCOSE 93 --  BUN 23 --  CREATININE 1.40* --  CALCIUM 9.0 8.5  MG -- --  PHOS -- --   Liver Function Tests: No results found for this basename: AST:2,ALT:2,ALKPHOS:2,BILITOT:2,PROT:2,ALBUMIN:2 in the last 72 hours   CBC:  Basename 09/07/12 0441  WBC 8.7  NEUTROABS --  HGB 10.2*  HCT 32.9*  MCV 88.7  PLT 259    Recent Results (from the past 240 hour(s))  CULTURE, BLOOD (ROUTINE X 2)     Status: Normal   Collection Time   08/31/12  8:54 PM      Component Value Range Status Comment   Specimen Description BLOOD LEFT ANTECUBITAL   Final    Special Requests BOTTLES DRAWN AEROBIC AND ANAEROBIC [place]   Final    Culture NO GROWTH 5 DAYS   Final    Report Status 09/05/2012 FINAL   Final   CULTURE, BLOOD (ROUTINE X 2)      Status: Normal   Collection Time   08/31/12  8:54 PM      Component Value Range Status Comment   Specimen Description BLOOD PORTA CATH DRAWN BY RN   Final    Special Requests     Final    Value: BOTTLES DRAWN AEROBIC AND ANAEROBIC AEB=10CC ANA=5CC   Culture NO GROWTH 5 DAYS   Final    Report Status 09/05/2012 FINAL   Final   MRSA PCR SCREENING     Status: Normal   Collection Time   08/31/12  9:03 PM      Component Value Range Status Comment   MRSA by PCR NEGATIVE  NEGATIVE Final      Hospital Course: She came to the emergency with a temperature of 103. She was complaining of cellulitis in her legs. She has had multiple bouts of similar problems. She has severe lymphedema of her legs bilaterally which predisposes her to this. She had nausea and vomiting as well. On the second hospital day she was noted to have pneumonia. He is felt that she probably has sleep apnea but this has not been documented.she was treated with intravenous antibiotics and improved. She had difficulty with nausea and vomiting. This resolved. She had some trouble with her Port-A-Cath and it was checked by her general surgeon we still cannot withdraw blood from it.  Discharge Exam: Blood pressure 140/74, pulse 68, temperature 97.9 F (36.6 C), temperature source Oral, resp. rate 20, height _0  (1.651 m), weight 176 kg (388 lb 0.2 oz), SpO2 97.00%. She is morbidly obese. Her chest is clear. Her heart is regular. Her abdomen is soft. Extremities showed much improved as far as cellulitis but still with severe lymphedema  Disposition: Home with home health services on IV antibiotics for 5 more days      Discharge Orders    Future Appointments: Provider: Department: Dept Phone: Center:   09/21/2012 1:15 PM [facility] [phone] None     Future Orders Please Complete By Expires   Home Health      Questions: Responses:   To provide the following care/treatments PT    RN   Face-to-face  encounter      Comments:   I Timisha Mondry L certify that this patient is under my care and that I, or a nurse practitioner or physician's assistant working with me, had a face-to-face encounter that meets the physician face-to-face encounter requirements with this patient on 09/09/2012.   Questions: Responses:   The encounter with the patient was in whole, or in part, for the following medical condition, which is the primary reason for home health care cellulitis   I certify that, based on my findings, the following services are medically necessary home health services Nursing    Physical therapy   My clinical findings support the need for the above services Infection requiring IV antibiotics   Further, I certify that my clinical findings support that this patient is homebound due to: Ambulates short distances less than 300 feet   To provide the following care/treatments PT    RN   Discharge patient         Follow-up Information    Follow up with [facility] S, MD. In 4 weeks.   Contact information:   [address] [address] [phone]          Signed: Alonza Bogus Pager [phone]  09/09/2012, 9:03 AM

## 2012-09-09 NOTE — Progress Notes (Signed)
Subjective: She says she feels better, has less complaints of abdominal discomfort and wants to go home  Objective: Vital signs in last 24 hours: Temp:  [97.9 F (36.6 C)-98.6 F (37 C)] 97.9 F (36.6 C) (09/21 0554) Pulse Rate:  [66-72] 68  (09/21 0554) Resp:  [20] 20  (09/21 0554) BP: (117-140)/(70-77) 140/74 mmHg (09/21 0554) SpO2:  [90 %-100 %] 97 % (09/21 0554) Weight change:  Last BM Date: 09/05/12  Intake/Output from previous day: 09/20 0701 - 09/21 0700 In: 9163 [P.O.:1200; I.V.:20; IV Piggyback:252] Out: 200 [Urine:200]  PHYSICAL EXAM General appearance: alert, cooperative, no distress and morbidly obese Resp: clear to auscultation bilaterally Cardio: regular rate and rhythm, S1, S2 normal, no murmur, click, rub or gallop GI: soft, non-tender; bowel sounds normal; no masses,  no organomegaly Extremities: Marked lymphedema cellulitis is much improved  Lab Results:    Basic Metabolic Panel:  Basename 09/07/12 0441 09/06/12 1056  NA 139 --  K 4.4 --  CL 104 --  CO2 29 --  GLUCOSE 93 --  BUN 23 --  CREATININE 1.40* --  CALCIUM 9.0 8.5  MG -- --  PHOS -- --   Liver Function Tests: No results found for this basename: AST:2,ALT:2,ALKPHOS:2,BILITOT:2,PROT:2,ALBUMIN:2 in the last 72 hours No results found for this basename: LIPASE:2,AMYLASE:2 in the last 72 hours No results found for this basename: AMMONIA:2 in the last 72 hours CBC:  Basename 09/07/12 0441  WBC 8.7  NEUTROABS --  HGB 10.2*  HCT 32.9*  MCV 88.7  PLT 259   Cardiac Enzymes: No results found for this basename: CKTOTAL:3,CKMB:3,CKMBINDEX:3,TROPONINI:3 in the last 72 hours BNP: No results found for this basename: PROBNP:3 in the last 72 hours D-Dimer: No results found for this basename: DDIMER:2 in the last 72 hours CBG:  Basename 09/09/12 0753 09/08/12 2136 09/08/12 1658 09/08/12 1242 09/08/12 0756 09/07/12 2108  GLUCAP 96 80 141* 110* 81 95   Hemoglobin A1C: No results found for  this basename: HGBA1C in the last 72 hours Fasting Lipid Panel: No results found for this basename: CHOL,HDL,LDLCALC,TRIG,CHOLHDL,LDLDIRECT in the last 72 hours Thyroid Function Tests: No results found for this basename: TSH,T4TOTAL,FREET4,T3FREE,THYROIDAB in the last 72 hours Anemia Panel: No results found for this basename: VITAMINB12,FOLATE,FERRITIN,TIBC,IRON,RETICCTPCT in the last 72 hours Coagulation: No results found for this basename: LABPROT:2,INR:2 in the last 72 hours Urine Drug Screen: Drugs of Abuse  No results found for this basename: labopia, cocainscrnur, labbenz, amphetmu, thcu, labbarb    Alcohol Level: No results found for this basename: ETH:2 in the last 72 hours Urinalysis: No results found for this basename: COLORURINE:2,APPERANCEUR:2,LABSPEC:2,PHURINE:2,GLUCOSEU:2,HGBUR:2,BILIRUBINUR:2,KETONESUR:2,PROTEINUR:2,UROBILINOGEN:2,NITRITE:2,LEUKOCYTESUR:2 in the last 72 hours Misc. Labs:  ABGS No results found for this basename: PHART,PCO2,PO2ART,TCO2,HCO3 in the last 72 hours CULTURES Recent Results (from the past 240 hour(s))  CULTURE, BLOOD (ROUTINE X 2)     Status: Normal   Collection Time   08/31/12  8:54 PM      Component Value Range Status Comment   Specimen Description BLOOD LEFT ANTECUBITAL   Final    Special Requests BOTTLES DRAWN AEROBIC AND ANAEROBIC [place]   Final    Culture NO GROWTH 5 DAYS   Final    Report Status 09/05/2012 FINAL   Final   CULTURE, BLOOD (ROUTINE X 2)     Status: Normal   Collection Time   08/31/12  8:54 PM      Component Value Range Status Comment   Specimen Description BLOOD PORTA CATH DRAWN BY RN   Final  Special Requests     Final    Value: BOTTLES DRAWN AEROBIC AND ANAEROBIC AEB=10CC ANA=5CC   Culture NO GROWTH 5 DAYS   Final    Report Status 09/05/2012 FINAL   Final   MRSA PCR SCREENING     Status: Normal   Collection Time   08/31/12  9:03 PM      Component Value Range Status Comment   MRSA by PCR NEGATIVE  NEGATIVE  Final    Studies/Results: No results found.  Medications:  Prior to Admission:  Prescriptions prior to admission  Medication Sig Dispense Refill  . acetaminophen (TYLENOL) 325 MG tablet Take 650 mg by mouth every 6 (six) hours as needed. fever      . amLODipine (NORVASC) 10 MG tablet Take 1 tablet (10 mg total) by mouth daily.  30 tablet  0  . docusate sodium (COLACE) 100 MG capsule Take 100 mg by mouth 2 (two) times daily as needed. For constipation      . enalapril (VASOTEC) 2.5 MG tablet Take 2.5 mg by mouth daily.      Marland Kitchen HYDROcodone-acetaminophen (NORCO) 10-325 MG per tablet Take 1 tablet by mouth 4 (four) times daily as needed. For pain      . insulin NPH-insulin regular (NOVOLIN 70/30) (70-30) 100 UNIT/ML injection Inject 60 Units into the skin 2 (two) times daily.      . metoprolol (LOPRESSOR) 50 MG tablet Take 1 tablet (50 mg total) by mouth 2 (two) times daily.  60 tablet  0  . ondansetron (ZOFRAN) 8 MG tablet Take 8 mg by mouth 4 (four) times daily as needed. For nausea      . potassium chloride 20 MEQ/15ML (10%) solution Take 20 mEq by mouth 2 (two) times daily.       Marland Kitchen torsemide (DEMADEX) 100 MG tablet Take 50 mg by mouth 2 (two) times daily.        Scheduled:   . alteplase  2 mg Intracatheter Once  . alteplase  2 mg Intracatheter Once  . amLODipine  10 mg Oral Daily  . antiseptic oral rinse  15 mL Mouth Rinse BID  . ceFTAROline (TEFLARO) IV  600 mg Intravenous Q12H  . docusate sodium  100 mg Oral BID  . enoxaparin (LOVENOX) injection  90 mg Subcutaneous Q24H  . fluconazole  100 mg Oral Daily  . insulin aspart  0-20 Units Subcutaneous TID WC  . insulin aspart  0-5 Units Subcutaneous QHS  . insulin aspart  6 Units Subcutaneous TID WC  . insulin glargine  30 Units Subcutaneous QHS  . metoCLOPramide  5 mg Oral TID AC & HS  . metoprolol  50 mg Oral BID  . moxifloxacin  400 mg Oral Q24H  . nystatin  5 mL Oral QID  . sodium chloride  10-40 mL Intracatheter Q12H  . sterile  water (preservative free)  2.2 mL Injection Once   Continuous:  HLK:[id number], acetaminophen, albuterol, alum & mag hydroxide-simeth, HYDROcodone-acetaminophen, HYDROmorphone (DILAUDID) injection, ondansetron (ZOFRAN) IV, ondansetron, promethazine, sodium chloride  Assesment: She is markedly improved. I think she's ready for discharge. Active Problems:  Lymphedema of lower extremity  Cellulitis of left leg  Diabetes mellitus  Morbid obesity  Acute renal failure  HTN (hypertension)  Obesity hypoventilation syndrome  Community acquired pneumonia  Nausea & vomiting  Hyperkalemia, diminished renal excretion    Plan: Discharge home with home health services. She will receive 5 more days of intravenous antibiotics    LOS: 9 days  Barak Bialecki L 09/09/2012, 8:30 AM

## 2012-09-09 NOTE — Progress Notes (Signed)
Pt discharged to personal vehicle driven by relative. Discharge instructions and meds reviewed with pt with good understanding. Currently voices no c/o pain or discomfort.

## 2012-09-09 NOTE — Progress Notes (Signed)
Pt discharged to personal vehicle via w/c. Discharge instructions and meds reviewed with pt with good understanding. Currently, pt voices no c/o pain or discomfort.

## 2012-09-21 ENCOUNTER — Ambulatory Visit (HOSPITAL_COMMUNITY): Payer: Self-pay

## 2013-04-04 ENCOUNTER — Emergency Department (HOSPITAL_COMMUNITY)
Admission: EM | Admit: 2013-04-04 | Discharge: 2013-04-04 | Disposition: A | Discharge status: Home / Self Care | Payer: Medicaid Other | Attending: Emergency Medicine | Admitting: Emergency Medicine

## 2013-04-04 ENCOUNTER — Emergency Department (HOSPITAL_COMMUNITY): Payer: Medicaid Other

## 2013-04-04 ENCOUNTER — Encounter (HOSPITAL_COMMUNITY): Payer: Self-pay

## 2013-04-04 DIAGNOSIS — G8929 Other chronic pain: Secondary | ICD-10-CM | POA: Insufficient documentation

## 2013-04-04 DIAGNOSIS — Z90710 Acquired absence of both cervix and uterus: Secondary | ICD-10-CM | POA: Insufficient documentation

## 2013-04-04 DIAGNOSIS — R197 Diarrhea, unspecified: Secondary | ICD-10-CM | POA: Insufficient documentation

## 2013-04-04 DIAGNOSIS — I10 Essential (primary) hypertension: Secondary | ICD-10-CM | POA: Insufficient documentation

## 2013-04-04 DIAGNOSIS — Z8669 Personal history of other diseases of the nervous system and sense organs: Secondary | ICD-10-CM | POA: Insufficient documentation

## 2013-04-04 DIAGNOSIS — I890 Lymphedema, not elsewhere classified: Secondary | ICD-10-CM | POA: Insufficient documentation

## 2013-04-04 DIAGNOSIS — R109 Unspecified abdominal pain: Secondary | ICD-10-CM

## 2013-04-04 DIAGNOSIS — Z872 Personal history of diseases of the skin and subcutaneous tissue: Secondary | ICD-10-CM | POA: Insufficient documentation

## 2013-04-04 DIAGNOSIS — Z3202 Encounter for pregnancy test, result negative: Secondary | ICD-10-CM | POA: Insufficient documentation

## 2013-04-04 DIAGNOSIS — Z862 Personal history of diseases of the blood and blood-forming organs and certain disorders involving the immune mechanism: Secondary | ICD-10-CM | POA: Insufficient documentation

## 2013-04-04 DIAGNOSIS — Z8719 Personal history of other diseases of the digestive system: Secondary | ICD-10-CM | POA: Insufficient documentation

## 2013-04-04 DIAGNOSIS — Z9889 Other specified postprocedural states: Secondary | ICD-10-CM | POA: Insufficient documentation

## 2013-04-04 DIAGNOSIS — Z9981 Dependence on supplemental oxygen: Secondary | ICD-10-CM | POA: Insufficient documentation

## 2013-04-04 DIAGNOSIS — Z8639 Personal history of other endocrine, nutritional and metabolic disease: Secondary | ICD-10-CM | POA: Insufficient documentation

## 2013-04-04 DIAGNOSIS — R112 Nausea with vomiting, unspecified: Secondary | ICD-10-CM | POA: Insufficient documentation

## 2013-04-04 DIAGNOSIS — E119 Type 2 diabetes mellitus without complications: Secondary | ICD-10-CM | POA: Insufficient documentation

## 2013-04-04 DIAGNOSIS — Z794 Long term (current) use of insulin: Secondary | ICD-10-CM | POA: Insufficient documentation

## 2013-04-04 DIAGNOSIS — Z87442 Personal history of urinary calculi: Secondary | ICD-10-CM | POA: Insufficient documentation

## 2013-04-04 DIAGNOSIS — I2510 Atherosclerotic heart disease of native coronary artery without angina pectoris: Secondary | ICD-10-CM | POA: Insufficient documentation

## 2013-04-04 DIAGNOSIS — J45909 Unspecified asthma, uncomplicated: Secondary | ICD-10-CM | POA: Insufficient documentation

## 2013-04-04 DIAGNOSIS — R1011 Right upper quadrant pain: Secondary | ICD-10-CM | POA: Insufficient documentation

## 2013-04-04 DIAGNOSIS — Z79899 Other long term (current) drug therapy: Secondary | ICD-10-CM | POA: Insufficient documentation

## 2013-04-04 LAB — COMPREHENSIVE METABOLIC PANEL
ALT: 7 U/L (ref 0–35)
AST: 10 U/L (ref 0–37)
Albumin: 2.9 g/dL — ABNORMAL LOW (ref 3.5–5.2)
CO2: 31 mEq/L (ref 19–32)
Calcium: 9.4 mg/dL (ref 8.4–10.5)
Chloride: 96 mEq/L (ref 96–112)
Creatinine, Ser: 1.06 mg/dL (ref 0.50–1.10)
GFR calc non Af Amer: 62 mL/min — ABNORMAL LOW (ref >90)
Sodium: 135 mEq/L (ref 135–145)

## 2013-04-04 LAB — CBC WITH DIFFERENTIAL/PLATELET
Basophils Absolute: 0 10*3/uL (ref 0.0–0.1)
Basophils Relative: 0 % (ref 0–1)
Lymphocytes Relative: 20 % (ref 12–46)
MCHC: 32.6 g/dL (ref 30.0–36.0)
Monocytes Absolute: 0.5 10*3/uL (ref 0.1–1.0)
Neutro Abs: 6.1 10*3/uL (ref 1.7–7.7)
Platelets: 269 10*3/uL (ref 150–400)
RDW: 16.2 % — ABNORMAL HIGH (ref 11.5–15.5)
WBC: 8.7 10*3/uL (ref 4.0–10.5)

## 2013-04-04 LAB — URINALYSIS, ROUTINE W REFLEX MICROSCOPIC
Glucose, UA: 250 mg/dL — AB
Ketones, ur: NEGATIVE mg/dL
Leukocytes, UA: NEGATIVE
Nitrite: NEGATIVE
Specific Gravity, Urine: 1.02 (ref 1.005–1.030)
pH: 7 (ref 5.0–8.0)

## 2013-04-04 LAB — URINE MICROSCOPIC-ADD ON

## 2013-04-04 LAB — LACTIC ACID, PLASMA: Lactic Acid, Venous: 1.3 mmol/L (ref 0.5–2.2)

## 2013-04-04 LAB — PREGNANCY, URINE: Preg Test, Ur: NEGATIVE

## 2013-04-04 MED ORDER — HYDROMORPHONE HCL PF 1 MG/ML IJ SOLN
1.0000 mg | Freq: Once | INTRAMUSCULAR | Status: AC
  Administered 2013-04-04: 1 mg via INTRAVENOUS
  Filled 2013-04-04: qty 1

## 2013-04-04 MED ORDER — HYDROCODONE-ACETAMINOPHEN 5-325 MG PO TABS: 2.0000 | ORAL_TABLET | ORAL | Status: DC | PRN

## 2013-04-04 MED ORDER — ONDANSETRON HCL 4 MG PO TABS: 4.0000 mg | ORAL_TABLET | Freq: Four times a day (QID) | ORAL | Status: DC

## 2013-04-04 MED ORDER — ONDANSETRON HCL 4 MG/2ML IJ SOLN
4.0000 mg | Freq: Once | INTRAMUSCULAR | Status: AC
  Administered 2013-04-04: 4 mg via INTRAVENOUS
  Filled 2013-04-04: qty 2

## 2013-04-04 MED ORDER — CEPHALEXIN 500 MG PO CAPS: 500.0000 mg | ORAL_CAPSULE | Freq: Four times a day (QID) | ORAL | Status: DC

## 2013-04-04 MED ORDER — ONDANSETRON HCL 4 MG PO TABS
4.0000 mg | ORAL_TABLET | Freq: Once | ORAL | Status: AC
  Administered 2013-04-04: 4 mg via ORAL
  Filled 2013-04-04: qty 1

## 2013-04-04 MED ORDER — HEPARIN SOD (PORK) LOCK FLUSH 100 UNIT/ML IV SOLN
INTRAVENOUS | Status: AC
  Administered 2013-04-04: 500 [IU]
  Filled 2013-04-04: qty 5

## 2013-04-04 NOTE — ED Notes (Signed)
Pt c/o RUQ pain and NVD x1 week. Pt states eating makes pain worse. Pt states "sometimes" pain radiates to back.

## 2013-04-04 NOTE — ED Provider Notes (Signed)
History  This chart was scribed for Ezequiel Essex, MD by Roxan Diesel, ED scribe.  This patient was seen in room [facility] and the patient's care was started at 12:35 PM.  CSN: [id number]  Arrival date & time 04/04/13  1200      Chief Complaint  Patient presents with  . Abdominal Pain     The history is provided by the patient. No language interpreter was used.     HPI Comments: [name] is a 47 y.o. female who presents to the Emergency Department complaining of gradual onset, progressively worsening constant RUQ abdominal pain that began one week ago. Pain radiates laterally to right mid back. Pt states pain is exacerbated by eating. There is associated emesis, diarrhea, and nausea. She denies dysuria or hematuria.  Pt reports similar symptoms in the past that resolved on their own.  She is on oxygen at home for COPD. Patient's other medical history includes lymphedema, recurrent cellulitis in lower extremities, DM, diverticulitis, and kidney stones.  She states symptoms feel distinct from her experiences with kidney stones. Her surgical history includes hysterectomy. No history of cholecystectomy.   Past Medical History  Diagnosis Date  . Diabetes mellitus   . Chronic pain   . Lymph edema   . Cellulitis   . Chiari malformation     s/p surgery  . S/P colonoscopy 05/26/2007    Dr. Laural Golden sigmoid diverticulosis random biopsies benign  . CAD (coronary artery disease)   . Hypercholesterolemia   . Hypertension   . Asthma   . S/P endoscopy 05/01/2009    Dr. Penelope Coop pill-induced esophageal ulcerations distal to midesophagus, 2 small ulcers in the antrum of the stomach    Past Surgical History  Procedure Laterality Date  . Cesarean section       x 2  . Abdominal hysterectomy    . Tonsillectomy    . Adenoidectomy    . Thyroidectomy, partial    . Craniectomy suboccipital w/ cervical laminectomy / chiari    . Portacath placement  07/05/2012    Procedure:  INSERTION PORT-A-CATH;  Surgeon: Donato Heinz, MD;  Location: [facility];  Service: General;  Laterality: Left;  subclavian    Family History  Problem Relation Age of Onset  . Colon cancer Mother 48  . Stroke Mother 62  . Coronary artery disease Mother     History  Substance Use Topics  . Smoking status: Never Smoker   . Smokeless tobacco: Never Used  . Alcohol Use: No    OB History   Grav Para Term Preterm Abortions TAB SAB Ect Mult Living                  Review of Systems A complete 10 system review of systems was obtained and all systems are negative except as noted in the HPI and PMH.    Allergies  Vancomycin; Contrast media; Pineapple; Pneumococcal vaccines; and Shellfish allergy  Home Medications   Current Outpatient Rx  Name  Route  Sig  Dispense  Refill  . acetaminophen (TYLENOL) 325 MG tablet   Oral   Take 650 mg by mouth every 6 (six) hours as needed. fever         . ALPRAZolam (XANAX) 0.5 MG tablet   Oral   Take 0.5 mg by mouth 3 (three) times daily as needed for sleep.         . diphenhydrAMINE (BENADRYL) 25 mg capsule   Oral   Take 25 mg  by mouth every 6 (six) hours as needed for itching or allergies.         Marland Kitchen docusate sodium (COLACE) 100 MG capsule   Oral   Take 100 mg by mouth 2 (two) times daily as needed. For constipation         . insulin NPH-insulin regular (NOVOLIN 70/30) (70-30) 100 UNIT/ML injection   Subcutaneous   Inject 60 Units into the skin 2 (two) times daily.         . metoprolol (LOPRESSOR) 50 MG tablet   Oral   Take 50 mg by mouth 2 (two) times daily.         Marland Kitchen oxyCODONE (OXYCONTIN) 10 MG 12 hr tablet   Oral   Take 10 mg by mouth every 6 (six) hours as needed for pain.         . potassium chloride SA (K-DUR,KLOR-CON) 20 MEQ tablet   Oral   Take 20 mEq by mouth 3 (three) times daily.         Marland Kitchen torsemide (DEMADEX) 100 MG tablet   Oral   Take 50 mg by mouth 2 (two) times daily.          Marland Kitchen EXPIRED:  amLODipine (NORVASC) 10 MG tablet   Oral   Take 1 tablet (10 mg total) by mouth daily.   30 tablet   0   . ondansetron (ZOFRAN) 8 MG tablet   Oral   Take 8 mg by mouth 4 (four) times daily as needed. For nausea           Triage Vitals: BP 158/107  Pulse 118  Temp(Src) 98.1 F (36.7 C) (Oral)  Resp 22  Ht _0  (1.651 m)  Wt 370 lb (167.831 kg)  BMI 61.57 kg/m2  SpO2 100%  Physical Exam  Constitutional: She is oriented to person, place, and time. She appears well-developed and well-nourished.  HENT:  Head: Normocephalic and atraumatic.  Eyes: Conjunctivae and EOM are normal. Pupils are equal, round, and reactive to light.  Neck: Normal range of motion. Neck supple.  Cardiovascular: Normal rate.   No murmur heard. Pulmonary/Chest: Breath sounds normal. No respiratory distress.  Abdominal: Soft. There is tenderness in the right upper quadrant. There is guarding.  Positive Murphy's sign.  Musculoskeletal: Normal range of motion. She exhibits edema (Massive lower extremity lymphedema).  Neurological: She is alert and oriented to person, place, and time.    ED Course  Procedures (including critical care time)  DIAGNOSTIC STUDIES: Oxygen Saturation is 100% on room air, normal by my interpretation.    COORDINATION OF CARE: 12:31 PM-Discussed likelihood of gallbladder inflammation, and treatment plan which includes labs, imaging, and possible cholecystectomy with pt at bedside and pt agreed to plan.    Labs Reviewed  CBC WITH DIFFERENTIAL - Abnormal; Notable for the following:    Hemoglobin 10.8 (*)    HCT 33.1 (*)    RDW 16.2 (*)    All other components within normal limits  COMPREHENSIVE METABOLIC PANEL - Abnormal; Notable for the following:    Glucose, Bld 269 (*)    Albumin 2.9 (*)    GFR calc non Af Amer 62 (*)    GFR calc Af Amer 72 (*)    All other components within normal limits  LIPASE, BLOOD - Abnormal; Notable for the following:    Lipase 66 (*)    All  other components within normal limits  URINALYSIS, ROUTINE W REFLEX MICROSCOPIC - Abnormal; Notable for the  following:    Glucose, UA 250 (*)    Hgb urine dipstick MODERATE (*)    Protein, ur >300 (*)    All other components within normal limits  URINE MICROSCOPIC-ADD ON - Abnormal; Notable for the following:    Squamous Epithelial / LPF FEW (*)    All other components within normal limits  URINE CULTURE  LACTIC ACID, PLASMA  PREGNANCY, URINE    Ct Abdomen Pelvis Wo Contrast  04/04/2013  *RADIOLOGY REPORT*  Clinical Data: Right upper quadrant pain for 1 week.  Pain radiating to the back.  CT ABDOMEN AND PELVIS WITHOUT CONTRAST  Technique:  Multidetector CT imaging of the abdomen and pelvis was performed following the standard protocol without intravenous contrast.  Comparison: CT 02/05/2012.  Findings: Lung Bases: Mild respiratory motion is present on the examination.  Atelectasis in both lower lobes.  Liver:  Enlarged.  No focal mass lesion. Unenhanced CT was performed per clinician order.  Lack of IV contrast limits sensitivity and specificity, especially for evaluation of abdominal/pelvic solid viscera.  Spleen:  Normal.  Gallbladder:  No calcified gallstones.  Common bile duct:  Normal.  Pancreas:  Normal.  Adrenal glands:  Normal.  Kidneys:  Left kidney and ureter appear normal.  No renal calculi. The right ureter appears normal as well.  There is subtle right perinephric stranding suggesting ascending urinary tract infection. Correlation with urinalysis recommended.  Unchanged right retrocaval lymph node measuring 12 mm short axis (image 29 series 2).  Stomach:  Grossly normal.  Small bowel:  Normal.  Colon:   Colonic diverticulosis.  No diverticulitis or inflammatory changes of colon.  Pelvic Genitourinary:  Hysterectomy.  The adnexa appear within normal limits.  Right ovarian cysts.  Bones:  No aggressive osseous lesions.  Lumbar facet arthrosis. Fat containing periumbilical hernia.  Scarring  in the lower abdominal wall.  Vasculature: Grossly normal.  IMPRESSION:  1.  Subtle right perinephric stranding is suspicious for a ascending urinary tract infection.  Correlation with urinalysis recommended. 2.  Hysterectomy.  Postop changes the anterior abdominal wall with small fat containing periumbilical hernia. 3.  Stable right retroperitoneal lymphadenopathy, nonspecific. 4.  Unchanged hepatomegaly   Original Report Authenticated By: Dereck Ligas, M.D.    US Abdomen Limited Ruq  04/04/2013  *RADIOLOGY REPORT*  Clinical Data:  Right upper quadrant pain.  Nausea and vomiting.  LIMITED ABDOMINAL ULTRASOUND - RIGHT UPPER QUADRANT  Comparison:  Right upper quadrant ultrasound 09/01/2012.  Findings:  Gallbladder:  No stones, gallbladder wall thickening or pericholecystic fluid. Sonographer reports negative Murphy's sign.  Common bile duct:  Measures 0.4 cm.  Liver:  The liver demonstrates increased echogenicity and coarsened echotexture consistent with fatty infiltration.  The liver measures approximately 20 cm cranial-caudal.  There is normal flow in the portal vein.  IMPRESSION:  1.  Normal-appearing gallbladder.  No acute finding. 2.  Fatty infiltration of the liver.                    Original Report Authenticated By: Orlean Patten, M.D.      No diagnosis found.    MDM  One-week history of right upper quadrant pain with nausea, vomiting diarrhea. Unable to eat since last night. Denies fever. Denies chest pain or shortness of breath. On chronic home O2 at 2 L.  Massively obese with right upper quadrant pain to palpation. Lipase 66. LFTs normal.  Gallbladder appears normal on ultrasound. The patient's pain is improved with medications. No vomiting and ED.  UTI suspected on CT but UA not convincing for infection. No evidence of kidney stone. Culture sent.     I personally performed the services described in this documentation, which was scribed in my presence. The recorded information  has been reviewed and is accurate.    Ezequiel Essex, MD 04/04/13 [phone]

## 2013-04-04 NOTE — ED Notes (Signed)
Pt c/o RUQ with n/v/d x 1 week.  Says pain has been getting progressively worse.

## 2013-04-27 ENCOUNTER — Other Ambulatory Visit (HOSPITAL_COMMUNITY): Payer: Self-pay | Admitting: Pulmonary Disease

## 2013-04-27 DIAGNOSIS — R109 Unspecified abdominal pain: Secondary | ICD-10-CM

## 2013-05-01 ENCOUNTER — Ambulatory Visit (HOSPITAL_COMMUNITY)
Admission: RE | Admit: 2013-05-01 | Discharge: 2013-05-01 | Disposition: A | Discharge status: Home / Self Care | Payer: Medicaid Other | Source: Ambulatory Visit | Attending: Pulmonary Disease | Admitting: Pulmonary Disease

## 2013-05-03 ENCOUNTER — Encounter (HOSPITAL_COMMUNITY)
Admission: RE | Admit: 2013-05-03 | Discharge: 2013-05-03 | Disposition: A | Discharge status: Home / Self Care | Payer: Medicaid Other | Source: Ambulatory Visit | Attending: Pulmonary Disease | Admitting: Pulmonary Disease

## 2013-05-03 ENCOUNTER — Encounter (HOSPITAL_COMMUNITY): Payer: Self-pay

## 2013-05-03 DIAGNOSIS — R109 Unspecified abdominal pain: Secondary | ICD-10-CM | POA: Insufficient documentation

## 2013-05-03 DIAGNOSIS — E119 Type 2 diabetes mellitus without complications: Secondary | ICD-10-CM | POA: Insufficient documentation

## 2013-05-03 DIAGNOSIS — I10 Essential (primary) hypertension: Secondary | ICD-10-CM | POA: Insufficient documentation

## 2013-05-03 MED ORDER — TECHNETIUM TC 99M MEBROFENIN IV KIT
5.0000 | PACK | Freq: Once | INTRAVENOUS | Status: AC | PRN
  Administered 2013-05-03: 4.8 via INTRAVENOUS

## 2013-05-03 MED ORDER — HEPARIN SOD (PORK) LOCK FLUSH 100 UNIT/ML IV SOLN
INTRAVENOUS | Status: AC
  Filled 2013-05-03: qty 5

## 2013-05-03 MED ORDER — SINCALIDE 5 MCG IJ SOLR
INTRAMUSCULAR | Status: AC
  Administered 2013-05-03: 3.63 ug
  Filled 2013-05-03: qty 5

## 2013-05-03 MED ORDER — MORPHINE SULFATE 4 MG/ML IJ SOLN
INTRAMUSCULAR | Status: AC
  Filled 2013-05-03: qty 1

## 2013-05-03 MED ORDER — MORPHINE SULFATE 4 MG/ML IJ SOLN: 3.0000 mg | Freq: Once | INTRAMUSCULAR | Status: DC

## 2013-05-03 NOTE — Progress Notes (Signed)
Power port accessed with 20G Huber needle per protocol. Blood return noted. Needle removed after procedure. Port flushed with 22m 0.9% NS  and 521mHeparin 100u/mL.  Morphine 36m48mrdered for gallbladder study. 4mg79mrpuject taken out of [facility] and 1mg 45mted. Dr. BolesThornton Papasorder before administration of med due to ability to now visualize gallbladder. Remaining 36mg o35morphine wasted witnessed by Elisa Petra Kuba

## 2013-05-16 ENCOUNTER — Ambulatory Visit: Payer: Medicaid Other | Admitting: Orthopedic Surgery

## 2013-05-30 ENCOUNTER — Ambulatory Visit (INDEPENDENT_AMBULATORY_CARE_PROVIDER_SITE_OTHER): Payer: Medicaid Other

## 2013-05-30 ENCOUNTER — Encounter: Payer: Self-pay | Admitting: Orthopedic Surgery

## 2013-05-30 ENCOUNTER — Ambulatory Visit (INDEPENDENT_AMBULATORY_CARE_PROVIDER_SITE_OTHER): Payer: Medicaid Other | Admitting: Orthopedic Surgery

## 2013-05-30 VITALS — BP 170/98 | Ht 65.0 in | Wt 342.0 lb

## 2013-05-30 DIAGNOSIS — M25519 Pain in unspecified shoulder: Secondary | ICD-10-CM

## 2013-05-30 DIAGNOSIS — M25511 Pain in right shoulder: Secondary | ICD-10-CM

## 2013-05-30 DIAGNOSIS — M719 Bursopathy, unspecified: Secondary | ICD-10-CM

## 2013-05-30 DIAGNOSIS — M75101 Unspecified rotator cuff tear or rupture of right shoulder, not specified as traumatic: Secondary | ICD-10-CM

## 2013-05-30 NOTE — Patient Instructions (Addendum)
aRotator Cuff Tendonitis  The rotator cuff is the collection of all the muscles and tendons (the supraspinatus, infraspinatus, subscapularis, and teres minor muscles and their tendons) that help your shoulder stay in place. This unit holds the head of the upper arm bone (humerus) in the cup (fossa) of the shoulder blade (scapula). Basically, it connects the arm to the shoulder. Tendinitis is a swelling and irritation of the tissue, called cord like structures (tendons) that connect muscle to bone. It usually is caused by overusing the joint involved. When the tissue surrounding a tendon (the synovium) becomes inflamed, it is called tenosynovitis. This also is often the result of overuse in people whose jobs require repetitive (over and over again) types of motion.     You have received a steroid shot. 15% of patients experience increased pain at the injection site with in the next 24 hours. This is best treated with ice and tylenol extra strength 2 tabs every 8 hours. If you are still having pain please call the office.   Start home exercises   Continue your oxycodone 10 mg

## 2013-05-30 NOTE — Progress Notes (Signed)
Patient ID: [name], female   DOB: May 01, 1966, 47 y.o.   MRN: [id number] Chief Complaint  Patient presents with  . Shoulder Pain    Right shoulder pain, no injury. Consult from Dr. [name].   History right shoulder pain 2 months. Gradual onset. Treatment medication and rest. Throbbing 6/10 intermittent pain with catching and difficulty and painful for elevation  System review chills fatigue blurred vision shortness of breath wheezing cough nausea vomiting constipation poor healing of the skin anxiety adverse food reaction and seasonal allergies  Past Medical History  Diagnosis Date  . Diabetes mellitus   . Chronic pain   . Lymph edema   . Cellulitis   . Chiari malformation     s/p surgery  . S/P colonoscopy 05/26/2007    Dr. Laural Golden sigmoid diverticulosis random biopsies benign  . CAD (coronary artery disease)   . Hypercholesterolemia   . Hypertension   . Asthma   . S/P endoscopy 05/01/2009    Dr. Penelope Coop pill-induced esophageal ulcerations distal to midesophagus, 2 small ulcers in the antrum of the stomach   Past Surgical History  Procedure Laterality Date  . Cesarean section       x 2  . Abdominal hysterectomy    . Tonsillectomy    . Adenoidectomy    . Thyroidectomy, partial    . Craniectomy suboccipital w/ cervical laminectomy / chiari    . Portacath placement  07/05/2012    Procedure: INSERTION PORT-A-CATH;  Surgeon: Donato Heinz, MD;  Location: [facility];  Service: General;  Laterality: Left;  subclavian    BP 170/98  Ht _0  (1.651 m)  Wt 342 lb (155.13 kg)  BMI 56.91 kg/m2 This is a very obese female with normal grooming. Oriented x3. Mood and affect normal. Ambulating with a cane and oxygen. Normal range of motion in the left shoulder no tenderness. Stability confirmed with apprehension test. strength normal. Skin normal.  Right shoulder tenderness posterior acromion, anterolateral acromion deltoid. Painful range of motion at 90 painful ARC of motion  90-150. Apprehension normal. Rotator cuff strength mild weakness supraspinatus. Skin normal.  Radiographs ordered and shows no acute fracture see report   Encounter Diagnoses  Name Primary?  . Pain in joint, shoulder region, right Yes  . Rotator cuff syndrome of right shoulder     Recommend subacromial injection and home stretching exercise program  Shoulder Injection Procedure Note   Pre-operative Diagnosis: right  RC Syndrome  Post-operative Diagnosis: same  Indications: pain   Anesthesia: ethyl chloride   Procedure Details   Verbal consent was obtained for the procedure. The shoulder was prepped withalcohol and the skin was anesthetized. A 20 gauge needle was advanced into the subacromial space through posterior approach without difficulty  The space was then injected with 3 ml 1% lidocaine and 1 ml of depomedrol. The injection site was cleansed with isopropyl alcohol and a dressing was applied.  Complications:  None; patient tolerated the procedure well.

## 2013-06-27 ENCOUNTER — Ambulatory Visit (HOSPITAL_COMMUNITY)
Admission: RE | Admit: 2013-06-27 | Discharge: 2013-06-27 | Disposition: A | Discharge status: Home / Self Care | Payer: Medicaid Other | Source: Ambulatory Visit | Attending: Pulmonary Disease | Admitting: Pulmonary Disease

## 2013-06-27 DIAGNOSIS — M67919 Unspecified disorder of synovium and tendon, unspecified shoulder: Secondary | ICD-10-CM | POA: Insufficient documentation

## 2013-06-27 DIAGNOSIS — IMO0001 Reserved for inherently not codable concepts without codable children: Secondary | ICD-10-CM | POA: Insufficient documentation

## 2013-06-27 DIAGNOSIS — I10 Essential (primary) hypertension: Secondary | ICD-10-CM | POA: Insufficient documentation

## 2013-06-27 DIAGNOSIS — J4489 Other specified chronic obstructive pulmonary disease: Secondary | ICD-10-CM | POA: Insufficient documentation

## 2013-06-27 DIAGNOSIS — Z9181 History of falling: Secondary | ICD-10-CM | POA: Insufficient documentation

## 2013-06-27 DIAGNOSIS — M549 Dorsalgia, unspecified: Secondary | ICD-10-CM | POA: Insufficient documentation

## 2013-06-27 DIAGNOSIS — M719 Bursopathy, unspecified: Secondary | ICD-10-CM | POA: Insufficient documentation

## 2013-06-27 DIAGNOSIS — J449 Chronic obstructive pulmonary disease, unspecified: Secondary | ICD-10-CM | POA: Insufficient documentation

## 2013-06-27 DIAGNOSIS — E119 Type 2 diabetes mellitus without complications: Secondary | ICD-10-CM | POA: Insufficient documentation

## 2013-06-27 NOTE — Evaluation (Signed)
Occupational Therapy Evaluation  Patient Details  Name: [name] MRN: [id number] Date of Birth: 08-17-1966   06/27/13       [name] is a 47 year old female who has been referred to occupational therapy for assessment for need of a power wheelchair.  [name] is morbidly obese.  She has type II diabetes, hypertension, severe BLE lymphedema, COPD, fibromyalgia, chronic kidney disease, back pain, right shoulder bursitis, and a history of cellulitis.  She has fallen 3 times in the past 6 months.       [name] lives in a one story home with a ramped entrance.  She lives with her husband, who is wheelchair bound.  She is able to complete dressing activities, grooming activities, and bathing activities.  She is not able to transfer in and out of the tub due to not being able to lift the weight of her legs      [name] would like a power wheelchair to improve her safety and independence in her home.  She is not able to ambulate from her bedroom to her kitchen without becoming extremely fatigued.  She has bladder urgency, and often does not make it to the bathroom and is incontinent, due the extended amount of time it takes for her to ambulate from her bedroom to her bathroom.  She does have an aide that assists with IADLs and driving, 3.5 hours a day, and does not feel safe to ambulate freely about her home.  A FULL PHYSICAL ASSESSMENT REVEALS THE FOLLOWING  Existing Equipment: [name] has a cane and a shower seat.   Transfers: [name] transferred from sit to stand and stand to sit independently this date.   Ambulation:  [name] ambulated 50 feet x 2 this date with her cane.  She became very short of breath by the end of each walk. Balance:  WFL static.   Head and Neck: WNL  Trunk: WFL Pelvis: WNL  Hip: Right limited to 60%, left limited to 50% AROM.  Knees: Right limited to 60%, left limited to 50% AROM.  Feet and Ankles: bilateral limited to 25%  AROM.  Upper Extremities: [name] has WFL AROM in her BUE.  Her right  upper extremity strength is 4/5 and her left upper extremity strength 5/5.   Her grip strength is 39 pounds in her right hand and 54 pounds in her left hand. Lower Extremities: [name] has 3+/5 strength in lower extremities. Weight Shifting Ability: [name] is independent with weight shifting. Skin Integrity: BLE weep due to extreme lymphedema. Activity Tolerance:  Poor.  [name] is on 3 L O2 at all times.  She becomes winded with walking, talking, and during manual muscle testing this date.   GOALS/OBJECTIVE OF SEATING INTERVENTION  Recommendations:  [name] has functional deficits in the areas of mobility and self care, which are a result of the above listed diagnosis.  Specific functional limitations include: The patient is unable to walk without the use of an assistive device and very short distances due to poor sustained activity tolerance and shortness of breath.  She is unable to functionally propel a lightweight or standard manual wheelchair for independent mobility within the home due to shortness of breath, poor sustained activity tolerance, morbid obesity, and right shoulder bursitis. [name] will use the power wheelchair on a daily basis as her primary means of mobility.    The recommended wheelchair meets current and future positioning  needs, accessibility, durability, and safety requirements for functional use within patient's home environment. It is the least expensive power wheelchair that meets the patients current and future needs.   If you require any further information concerning [name]'s positioning, independence or mobility needs; or any further information why a lesser device will not work, please do not hesitate to contact me at [facility], [address], [place] [address] [phone].   ____________________ __________  Penelope Galas, OTR/L     Date   Today's Date: 06/27/2013           06/27/2013, 4:22 PM  Physician Documentation Your signature is required to indicate approval of the treatment plan as stated above.  Please sign and either send electronically or make a copy of this report for your files and return this physician signed original.  Please mark one 1.__approve of plan  2. ___approve of plan with the following conditions.   ______________________________                                                          _____________________ Physician Signature                                                                                                             Date

## 2013-08-11 ENCOUNTER — Emergency Department (HOSPITAL_COMMUNITY)
Admission: EM | Admit: 2013-08-11 | Discharge: 2013-08-12 | Disposition: A | Discharge status: Home / Self Care | Payer: Medicaid Other | Attending: Emergency Medicine | Admitting: Emergency Medicine

## 2013-08-11 ENCOUNTER — Encounter (HOSPITAL_COMMUNITY): Payer: Self-pay | Admitting: *Deleted

## 2013-08-11 DIAGNOSIS — R6883 Chills (without fever): Secondary | ICD-10-CM | POA: Insufficient documentation

## 2013-08-11 DIAGNOSIS — Z794 Long term (current) use of insulin: Secondary | ICD-10-CM | POA: Insufficient documentation

## 2013-08-11 DIAGNOSIS — Z90710 Acquired absence of both cervix and uterus: Secondary | ICD-10-CM | POA: Insufficient documentation

## 2013-08-11 DIAGNOSIS — IMO0001 Reserved for inherently not codable concepts without codable children: Secondary | ICD-10-CM | POA: Insufficient documentation

## 2013-08-11 DIAGNOSIS — J4489 Other specified chronic obstructive pulmonary disease: Secondary | ICD-10-CM | POA: Insufficient documentation

## 2013-08-11 DIAGNOSIS — E119 Type 2 diabetes mellitus without complications: Secondary | ICD-10-CM | POA: Insufficient documentation

## 2013-08-11 DIAGNOSIS — Z9089 Acquired absence of other organs: Secondary | ICD-10-CM | POA: Insufficient documentation

## 2013-08-11 DIAGNOSIS — Z79899 Other long term (current) drug therapy: Secondary | ICD-10-CM | POA: Insufficient documentation

## 2013-08-11 DIAGNOSIS — Z862 Personal history of diseases of the blood and blood-forming organs and certain disorders involving the immune mechanism: Secondary | ICD-10-CM | POA: Insufficient documentation

## 2013-08-11 DIAGNOSIS — J449 Chronic obstructive pulmonary disease, unspecified: Secondary | ICD-10-CM | POA: Insufficient documentation

## 2013-08-11 DIAGNOSIS — I10 Essential (primary) hypertension: Secondary | ICD-10-CM | POA: Insufficient documentation

## 2013-08-11 DIAGNOSIS — G8929 Other chronic pain: Secondary | ICD-10-CM

## 2013-08-11 DIAGNOSIS — Z872 Personal history of diseases of the skin and subcutaneous tissue: Secondary | ICD-10-CM | POA: Insufficient documentation

## 2013-08-11 DIAGNOSIS — I890 Lymphedema, not elsewhere classified: Secondary | ICD-10-CM | POA: Insufficient documentation

## 2013-08-11 DIAGNOSIS — Z8639 Personal history of other endocrine, nutritional and metabolic disease: Secondary | ICD-10-CM | POA: Insufficient documentation

## 2013-08-11 DIAGNOSIS — R51 Headache: Secondary | ICD-10-CM | POA: Insufficient documentation

## 2013-08-11 DIAGNOSIS — I2510 Atherosclerotic heart disease of native coronary artery without angina pectoris: Secondary | ICD-10-CM | POA: Insufficient documentation

## 2013-08-11 DIAGNOSIS — Z9889 Other specified postprocedural states: Secondary | ICD-10-CM | POA: Insufficient documentation

## 2013-08-11 HISTORY — DX: Fibromyalgia: M79.7

## 2013-08-11 MED ORDER — SODIUM CHLORIDE 0.9 % IV SOLN
INTRAVENOUS | Status: DC
  Administered 2013-08-11: 1000 mL via INTRAVENOUS

## 2013-08-11 MED ORDER — ONDANSETRON HCL 4 MG/2ML IJ SOLN
4.0000 mg | Freq: Once | INTRAMUSCULAR | Status: AC
  Administered 2013-08-11: 4 mg via INTRAVENOUS
  Filled 2013-08-11: qty 2

## 2013-08-11 MED ORDER — HYDROMORPHONE HCL PF 1 MG/ML IJ SOLN
1.0000 mg | Freq: Once | INTRAMUSCULAR | Status: AC
  Administered 2013-08-12: 1 mg via INTRAVENOUS
  Filled 2013-08-11: qty 1

## 2013-08-11 NOTE — ED Provider Notes (Signed)
CSN: [id number]     Arrival date & time 08/11/13  2215 History  This chart was scribed for Wynetta Fines, MD, by Neta Ehlers, ED Scribe. This patient was seen in room APA07/APA07 and the patient's care was started at 11:16 PM.   First MD Initiated Contact with Patient 08/11/13 2315     Chief Complaint  Patient presents with  . Generalized Pain     The history is provided by the patient. No language interpreter was used.   HPI Comments: [name] is a 47 y.o. female, with a h/o fibromyalgia, who presents to the Emergency Department and whose chief complaint is severe generalized pain "from head to toe". She has had chills, a headache, and pressure with voiding.The pt denies fever, nausea, emesis, or burning dysuria. She reports that palpation and movement increases the pain. She is on oxygen for COPD. she has chronic lymphedema of the lower extremities.  Past Medical History  Diagnosis Date  . Diabetes mellitus   . Chronic pain   . Lymph edema   . Cellulitis   . Chiari malformation     s/p surgery  . S/P colonoscopy 05/26/2007    Dr. Laural Golden sigmoid diverticulosis random biopsies benign  . CAD (coronary artery disease)   . Hypercholesterolemia   . Hypertension   . Asthma   . S/P endoscopy 05/01/2009    Dr. Penelope Coop pill-induced esophageal ulcerations distal to midesophagus, 2 small ulcers in the antrum of the stomach  . Fibromyalgia    Past Surgical History  Procedure Laterality Date  . Cesarean section       x 2  . Abdominal hysterectomy    . Tonsillectomy    . Adenoidectomy    . Thyroidectomy, partial    . Craniectomy suboccipital w/ cervical laminectomy / chiari    . Portacath placement  07/05/2012    Procedure: INSERTION PORT-A-CATH;  Surgeon: Donato Heinz, MD;  Location: [facility];  Service: General;  Laterality: Left;  subclavian   Family History  Problem Relation Age of Onset  . Colon cancer Mother 74  . Stroke Mother 69  . Coronary artery disease Mother     History  Substance Use Topics  . Smoking status: Never Smoker   . Smokeless tobacco: Never Used  . Alcohol Use: No   No OB history provided.  Review of Systems  All other systems reviewed and are negative.    A complete 10 system review of systems was obtained, and all systems were negative except where indicated in the HPI and PE.   Allergies  Vancomycin; Contrast media; Pineapple; Pneumococcal vaccines; and Shellfish allergy  Home Medications   Current Outpatient Rx  Name  Route  Sig  Dispense  Refill  . ALPRAZolam (XANAX) 0.5 MG tablet   Oral   Take 0.5 mg by mouth 3 (three) times daily as needed for sleep.         Marland Kitchen amLODipine (NORVASC) 10 MG tablet   Oral   Take 10 mg by mouth daily.         Marland Kitchen docusate sodium (COLACE) 100 MG capsule   Oral   Take 100 mg by mouth 2 (two) times daily as needed. For constipation         . insulin NPH-insulin regular (NOVOLIN 70/30) (70-30) 100 UNIT/ML injection   Subcutaneous   Inject 60 Units into the skin 2 (two) times daily.         . metoprolol (LOPRESSOR) 50 MG  tablet   Oral   Take 50 mg by mouth 2 (two) times daily.         Marland Kitchen oxyCODONE (OXYCONTIN) 10 MG 12 hr tablet   Oral   Take 10 mg by mouth every 6 (six) hours as needed for pain.         . potassium chloride SA (K-DUR,KLOR-CON) 20 MEQ tablet   Oral   Take 20 mEq by mouth 3 (three) times daily.         Marland Kitchen torsemide (DEMADEX) 100 MG tablet   Oral   Take 50 mg by mouth 2 (two) times daily.          Marland Kitchen acetaminophen (TYLENOL) 325 MG tablet   Oral   Take 650 mg by mouth every 6 (six) hours as needed. fever          Triage Vitals: BP 167/71  Pulse 79  Temp(Src) 98.9 F (37.2 C) (Oral)  Resp 24  Ht _0  (1.651 m)  Wt 330 lb (149.687 kg)  BMI 54.91 kg/m2  SpO2 94%  Physical Exam  Nursing note and vitals reviewed. Constitutional: She is oriented to person, place, and time. She appears well-developed and well-nourished. No distress.  HENT:   Head: Normocephalic and atraumatic.  Eyes: EOM are normal.  Neck: Neck supple. No tracheal deviation present.  Cardiovascular: Normal rate.   Pulmonary/Chest: Effort normal. No respiratory distress.  Abdominal: Soft. Bowel sounds are normal. She exhibits no distension. There is no tenderness.  Musculoskeletal: Normal range of motion. She exhibits edema and tenderness (Diffuse).  Chronic lymphedema. Lymphedema of lower extremities with left greater than right. She has pain associated with the lymphedema.   Neurological: She is alert and oriented to person, place, and time.  Skin: Skin is warm and dry.  Psychiatric: She has a normal mood and affect. Her behavior is normal.    ED Course   DIAGNOSTIC STUDIES:  Oxygen Saturation is 94% on room air, adequate by my interpretation.    COORDINATION OF CARE:  11:19 PM- Discussed treatment plan with patient, and the patient agreed to the plan.   Procedures (including critical care time)  MDM   Nursing notes and vitals signs, including pulse oximetry, reviewed.  Summary of this visit's results, reviewed by myself:  Labs:  Results for orders placed during the hospital encounter of 08/11/13 (from the past 24 hour(s))  URINALYSIS, ROUTINE W REFLEX MICROSCOPIC     Status: Abnormal   Collection Time    08/11/13 11:39 PM      Result Value Range   Color, Urine YELLOW  YELLOW   APPearance CLEAR  CLEAR   Specific Gravity, Urine 1.025  1.005 - 1.030   pH 6.0  5.0 - 8.0   Glucose, UA NEGATIVE  NEGATIVE mg/dL   Hgb urine dipstick MODERATE (*) NEGATIVE   Bilirubin Urine NEGATIVE  NEGATIVE   Ketones, ur NEGATIVE  NEGATIVE mg/dL   Protein, ur >300 (*) NEGATIVE mg/dL   Urobilinogen, UA 0.2  0.0 - 1.0 mg/dL   Nitrite NEGATIVE  NEGATIVE   Leukocytes, UA NEGATIVE  NEGATIVE  URINE MICROSCOPIC-ADD ON     Status: Abnormal   Collection Time    08/11/13 11:39 PM      Result Value Range   Squamous Epithelial / LPF FEW (*) RARE   WBC, UA 0-2  <3  WBC/hpf   RBC / HPF 3-6  <3 RBC/hpf   Bacteria, UA RARE  RARE  COMPREHENSIVE METABOLIC PANEL  Status: Abnormal   Collection Time    08/11/13 11:40 PM      Result Value Range   Sodium 133 (*) 135 - 145 mEq/L   Potassium 4.5  3.5 - 5.1 mEq/L   Chloride 98  96 - 112 mEq/L   CO2 30  19 - 32 mEq/L   Glucose, Bld 173 (*) 70 - 99 mg/dL   BUN 38 (*) 6 - 23 mg/dL   Creatinine, Ser 1.70 (*) 0.50 - 1.10 mg/dL   Calcium 9.3  8.4 - 10.5 mg/dL   Total Protein 8.4 (*) 6.0 - 8.3 g/dL   Albumin 3.0 (*) 3.5 - 5.2 g/dL   AST 15  0 - 37 U/L   ALT 12  0 - 35 U/L   Alkaline Phosphatase 102  39 - 117 U/L   Total Bilirubin 0.3  0.3 - 1.2 mg/dL   GFR calc non Af Amer 35 (*) >90 mL/min   GFR calc Af Amer 40 (*) >90 mL/min  CBC WITH DIFFERENTIAL     Status: Abnormal   Collection Time    08/11/13 11:40 PM      Result Value Range   WBC 8.6  4.0 - 10.5 K/uL   RBC 3.84 (*) 3.87 - 5.11 MIL/uL   Hemoglobin 10.7 (*) 12.0 - 15.0 g/dL   HCT 33.8 (*) 36.0 - 46.0 %   MCV 88.0  78.0 - 100.0 fL   MCH 27.9  26.0 - 34.0 pg   MCHC 31.7  30.0 - 36.0 g/dL   RDW 16.2 (*) 11.5 - 15.5 %   Platelets 283  150 - 400 K/uL   Neutrophils Relative % 60  43 - 77 %   Neutro Abs 5.2  1.7 - 7.7 K/uL   Lymphocytes Relative 32  12 - 46 %   Lymphs Abs 2.8  0.7 - 4.0 K/uL   Monocytes Relative 6  3 - 12 %   Monocytes Absolute 0.5  0.1 - 1.0 K/uL   Eosinophils Relative 2  0 - 5 %   Eosinophils Absolute 0.2  0.0 - 0.7 K/uL   Basophils Relative 0  0 - 1 %   Basophils Absolute 0.0  0.0 - 0.1 K/uL  CK     Status: None   Collection Time    08/11/13 11:40 PM      Result Value Range   Total CK 62  7 - 177 U/L   12:32 AM The patient is feeling much better after 1 mg of IV Dilaudid. We will give him a milligram of Dilaudid and she will follow up with Dr. [name] in the morning who manages her chronic pain.  I personally performed the services described in this documentation, which was scribed in my presence.  The recorded  information has been reviewed and is accurate.   Wynetta Fines, MD 08/12/13 [phone]

## 2013-08-11 NOTE — ED Notes (Signed)
Pt arrived by EMS from home. Called out for muscle pain & headache.

## 2013-08-12 LAB — URINALYSIS, ROUTINE W REFLEX MICROSCOPIC
Bilirubin Urine: NEGATIVE
Ketones, ur: NEGATIVE mg/dL
Protein, ur: >300 mg/dL — AB
Urobilinogen, UA: 0.2 mg/dL (ref 0.0–1.0)

## 2013-08-12 LAB — COMPREHENSIVE METABOLIC PANEL
Alkaline Phosphatase: 102 U/L (ref 39–117)
BUN: 38 mg/dL — ABNORMAL HIGH (ref 6–23)
Calcium: 9.3 mg/dL (ref 8.4–10.5)
Creatinine, Ser: 1.7 mg/dL — ABNORMAL HIGH (ref 0.50–1.10)
GFR calc Af Amer: 40 mL/min — ABNORMAL LOW (ref >90)
Glucose, Bld: 173 mg/dL — ABNORMAL HIGH (ref 70–99)
Potassium: 4.5 mEq/L (ref 3.5–5.1)
Total Protein: 8.4 g/dL — ABNORMAL HIGH (ref 6.0–8.3)

## 2013-08-12 LAB — CBC WITH DIFFERENTIAL/PLATELET
Basophils Absolute: 0 10*3/uL (ref 0.0–0.1)
HCT: 33.8 % — ABNORMAL LOW (ref 36.0–46.0)
Lymphocytes Relative: 32 % (ref 12–46)
Monocytes Absolute: 0.5 10*3/uL (ref 0.1–1.0)
Neutro Abs: 5.2 10*3/uL (ref 1.7–7.7)
RBC: 3.84 MIL/uL — ABNORMAL LOW (ref 3.87–5.11)
RDW: 16.2 % — ABNORMAL HIGH (ref 11.5–15.5)
WBC: 8.6 10*3/uL (ref 4.0–10.5)

## 2013-08-12 LAB — URINE MICROSCOPIC-ADD ON

## 2013-08-12 LAB — CK: Total CK: 62 U/L (ref 7–177)

## 2013-08-12 MED ORDER — HYDROMORPHONE HCL PF 1 MG/ML IJ SOLN
1.0000 mg | Freq: Once | INTRAMUSCULAR | Status: AC
  Administered 2013-08-12: 1 mg via INTRAVENOUS
  Filled 2013-08-12: qty 1

## 2013-08-12 MED ORDER — HEPARIN SOD (PORK) LOCK FLUSH 100 UNIT/ML IV SOLN
INTRAVENOUS | Status: AC
  Administered 2013-08-12: 01:00:00
  Filled 2013-08-12: qty 5

## 2013-08-12 NOTE — ED Notes (Signed)
Pt carried to car in wheelchair. No questions from pt or family.

## 2014-08-29 LAB — MICROALBUMIN, URINE: Microalb, Ur: 155.93

## 2014-10-08 ENCOUNTER — Other Ambulatory Visit (HOSPITAL_COMMUNITY): Payer: Self-pay | Admitting: Respiratory Therapy

## 2014-10-08 DIAGNOSIS — G4730 Sleep apnea, unspecified: Secondary | ICD-10-CM

## 2014-11-06 ENCOUNTER — Encounter (HOSPITAL_COMMUNITY): Payer: Self-pay

## 2014-11-06 ENCOUNTER — Emergency Department (HOSPITAL_COMMUNITY): Payer: Medicaid Other

## 2014-11-06 ENCOUNTER — Emergency Department (HOSPITAL_COMMUNITY)
Admission: EM | Admit: 2014-11-06 | Discharge: 2014-11-06 | Disposition: A | Discharge status: Home / Self Care | Payer: Medicaid Other | Attending: Emergency Medicine | Admitting: Emergency Medicine

## 2014-11-06 DIAGNOSIS — G8929 Other chronic pain: Secondary | ICD-10-CM | POA: Diagnosis not present

## 2014-11-06 DIAGNOSIS — I10 Essential (primary) hypertension: Secondary | ICD-10-CM | POA: Insufficient documentation

## 2014-11-06 DIAGNOSIS — Z79899 Other long term (current) drug therapy: Secondary | ICD-10-CM | POA: Diagnosis not present

## 2014-11-06 DIAGNOSIS — Z872 Personal history of diseases of the skin and subcutaneous tissue: Secondary | ICD-10-CM | POA: Diagnosis not present

## 2014-11-06 DIAGNOSIS — Z794 Long term (current) use of insulin: Secondary | ICD-10-CM | POA: Diagnosis not present

## 2014-11-06 DIAGNOSIS — E780 Pure hypercholesterolemia: Secondary | ICD-10-CM | POA: Diagnosis not present

## 2014-11-06 DIAGNOSIS — E119 Type 2 diabetes mellitus without complications: Secondary | ICD-10-CM | POA: Insufficient documentation

## 2014-11-06 DIAGNOSIS — J45901 Unspecified asthma with (acute) exacerbation: Secondary | ICD-10-CM | POA: Diagnosis not present

## 2014-11-06 DIAGNOSIS — I2510 Atherosclerotic heart disease of native coronary artery without angina pectoris: Secondary | ICD-10-CM | POA: Diagnosis not present

## 2014-11-06 DIAGNOSIS — R0602 Shortness of breath: Secondary | ICD-10-CM

## 2014-11-06 LAB — INFLUENZA PANEL BY PCR (TYPE A & B)
H1N1FLUPCR: NOT DETECTED
Influenza A By PCR: NEGATIVE
Influenza B By PCR: NEGATIVE

## 2014-11-06 LAB — COMPREHENSIVE METABOLIC PANEL
ALBUMIN: 3.5 g/dL (ref 3.5–5.2)
ALT: 12 U/L (ref 0–35)
ANION GAP: 10 (ref 5–15)
AST: 13 U/L (ref 0–37)
Alkaline Phosphatase: 134 U/L — ABNORMAL HIGH (ref 39–117)
BUN: 31 mg/dL — AB (ref 6–23)
CALCIUM: 9.1 mg/dL (ref 8.4–10.5)
CO2: 35 mEq/L — ABNORMAL HIGH (ref 19–32)
CREATININE: 1.68 mg/dL — AB (ref 0.50–1.10)
Chloride: 94 mEq/L — ABNORMAL LOW (ref 96–112)
GFR calc non Af Amer: 35 mL/min — ABNORMAL LOW (ref >90)
GFR, EST AFRICAN AMERICAN: 41 mL/min — AB (ref >90)
GLUCOSE: 212 mg/dL — AB (ref 70–99)
Potassium: 4.5 mEq/L (ref 3.7–5.3)
Sodium: 139 mEq/L (ref 137–147)
TOTAL PROTEIN: 8.4 g/dL — AB (ref 6.0–8.3)
Total Bilirubin: 0.5 mg/dL (ref 0.3–1.2)

## 2014-11-06 LAB — CBC WITH DIFFERENTIAL/PLATELET
BASOS PCT: 0 % (ref 0–1)
Basophils Absolute: 0 10*3/uL (ref 0.0–0.1)
EOS ABS: 0.2 10*3/uL (ref 0.0–0.7)
EOS PCT: 2 % (ref 0–5)
HCT: 36.7 % (ref 36.0–46.0)
HEMOGLOBIN: 11.5 g/dL — AB (ref 12.0–15.0)
LYMPHS ABS: 1.5 10*3/uL (ref 0.7–4.0)
Lymphocytes Relative: 23 % (ref 12–46)
MCH: 27.2 pg (ref 26.0–34.0)
MCHC: 31.3 g/dL (ref 30.0–36.0)
MCV: 86.8 fL (ref 78.0–100.0)
MONO ABS: 0.5 10*3/uL (ref 0.1–1.0)
MONOS PCT: 7 % (ref 3–12)
NEUTROS PCT: 68 % (ref 43–77)
Neutro Abs: 4.5 10*3/uL (ref 1.7–7.7)
Platelets: 231 10*3/uL (ref 150–400)
RBC: 4.23 MIL/uL (ref 3.87–5.11)
RDW: 17 % — ABNORMAL HIGH (ref 11.5–15.5)
WBC: 6.6 10*3/uL (ref 4.0–10.5)

## 2014-11-06 LAB — URINE MICROSCOPIC-ADD ON

## 2014-11-06 LAB — URINALYSIS, ROUTINE W REFLEX MICROSCOPIC
BILIRUBIN URINE: NEGATIVE
Glucose, UA: NEGATIVE mg/dL
Ketones, ur: NEGATIVE mg/dL
LEUKOCYTES UA: NEGATIVE
NITRITE: NEGATIVE
PH: 7 (ref 5.0–8.0)
Protein, ur: 100 mg/dL — AB
SPECIFIC GRAVITY, URINE: 1.02 (ref 1.005–1.030)
UROBILINOGEN UA: 0.2 mg/dL (ref 0.0–1.0)

## 2014-11-06 LAB — CK: Total CK: 44 U/L (ref 7–177)

## 2014-11-06 MED ORDER — HYDROCODONE-ACETAMINOPHEN 5-325 MG PO TABS: 2.0000 | ORAL_TABLET | ORAL | Status: DC | PRN

## 2014-11-06 MED ORDER — OSELTAMIVIR PHOSPHATE 75 MG PO CAPS: 75.0000 mg | ORAL_CAPSULE | Freq: Two times a day (BID) | ORAL | Status: DC

## 2014-11-06 MED ORDER — OXYCODONE-ACETAMINOPHEN 5-325 MG PO TABS
2.0000 | ORAL_TABLET | Freq: Once | ORAL | Status: AC
  Administered 2014-11-06: 2 via ORAL
  Filled 2014-11-06: qty 2

## 2014-11-06 NOTE — ED Notes (Signed)
[place] here to pickup patient.

## 2014-11-06 NOTE — Discharge Instructions (Signed)
Please call your doctor for a followup appointment within 24-48 hours. When you talk to your doctor please let them know that you were seen in the emergency department and have them acquire all of your records so that they can discuss the findings with you and formulate a treatment plan to fully care for your new and ongoing problems.  Your tests are normal.

## 2014-11-06 NOTE — ED Notes (Signed)
Pt c/o of SOB, generalized body aches, headaches and nausea times 2 days.

## 2014-11-06 NOTE — ED Provider Notes (Signed)
CSN: [id number]     Arrival date & time 11/06/14  1210 History   First MD Initiated Contact with Patient 11/06/14 1253     Chief Complaint  Patient presents with  . Shortness of Breath     (Consider location/radiation/quality/duration/timing/severity/associated sxs/prior Treatment) HPI Comments: Pt feelsdiffuse body aches (worse than usual)  + chills, nausea and more swelling thyan usual.  Sx started 2 days ago - is constant, gradually worsening, no9 better with home O2 (chornic).  On 247m torsemide / day.  No CHF.  Has hx of CKD.  No sick contacts.  No travel.    Patient is a 48y.o. female presenting with shortness of breath. The history is provided by the patient.  Shortness of Breath   Past Medical History  Diagnosis Date  . Diabetes mellitus   . Chronic pain   . Lymph edema   . Cellulitis   . Chiari malformation     s/p surgery  . S/P colonoscopy 05/26/2007    Dr. RLaural Goldensigmoid diverticulosis random biopsies benign  . CAD (coronary artery disease)   . Hypercholesterolemia   . Hypertension   . Asthma   . S/P endoscopy 05/01/2009    Dr. GPenelope Cooppill-induced esophageal ulcerations distal to midesophagus, 2 small ulcers in the antrum of the stomach  . Fibromyalgia    Past Surgical History  Procedure Laterality Date  . Cesarean section       x 2  . Abdominal hysterectomy    . Tonsillectomy    . Adenoidectomy    . Thyroidectomy, partial    . Craniectomy suboccipital w/ cervical laminectomy / chiari    . Portacath placement  07/05/2012    Procedure: INSERTION PORT-A-CATH;  Surgeon: BDonato Heinz MD;  Location: [facility];  Service: General;  Laterality: Left;  subclavian   Family History  Problem Relation Age of Onset  . Colon cancer Mother 554 . Stroke Mother 521 . Coronary artery disease Mother    History  Substance Use Topics  . Smoking status: Never Smoker   . Smokeless tobacco: Never Used  . Alcohol Use: No   OB History    No data available     Review  of Systems  Respiratory: Positive for shortness of breath.   All other systems reviewed and are negative.     Allergies  Vancomycin; Contrast media; Pineapple; Pneumococcal vaccines; and Shellfish allergy  Home Medications   Prior to Admission medications   Medication Sig Start Date End Date Taking? Authorizing Provider  acetaminophen (TYLENOL) 325 MG tablet Take 650 mg by mouth every 6 (six) hours as needed. fever   Yes Historical Provider, MD  ALPRAZolam (XANAX) 0.5 MG tablet Take 0.5 mg by mouth 3 (three) times daily as needed for anxiety or sleep.    Yes Historical Provider, MD  amLODipine (NORVASC) 10 MG tablet Take 10 mg by mouth daily.   Yes Historical Provider, MD  diphenhydrAMINE (BENADRYL) 25 mg capsule Take 25 mg by mouth every 6 (six) hours as needed for allergies.   Yes Historical Provider, MD  docusate sodium (COLACE) 100 MG capsule Take 100 mg by mouth 2 (two) times daily as needed. For constipation   Yes Historical Provider, MD  insulin NPH-insulin regular (NOVOLIN 70/30) (70-30) 100 UNIT/ML injection Inject 60 Units into the skin 2 (two) times daily.   Yes Historical Provider, MD  metoprolol (LOPRESSOR) 50 MG tablet Take 50 mg by mouth 2 (two) times daily.   Yes Historical  Provider, MD  Oxycodone HCl 10 MG TABS Take 10 mg by mouth 4 (four) times daily.   Yes Historical Provider, MD  potassium chloride SA (K-DUR,KLOR-CON) 20 MEQ tablet Take 20 mEq by mouth 3 (three) times daily.   Yes Historical Provider, MD  torsemide (DEMADEX) 100 MG tablet Take 100 mg by mouth 2 (two) times daily.    Yes Historical Provider, MD  HYDROcodone-acetaminophen (NORCO/VICODIN) 5-325 MG per tablet Take 2 tablets by mouth every 4 (four) hours as needed. 11/06/14   Johnna Acosta, MD  oseltamivir (TAMIFLU) 75 MG capsule Take 1 capsule (75 mg total) by mouth every 12 (twelve) hours. 11/06/14   Johnna Acosta, MD   BP 117/96 mmHg  Pulse 68  Temp(Src) 97.9 F (36.6 C) (Oral)  Resp 9  Ht _0   (1.651 m)  Wt 340 lb (154.223 kg)  BMI 56.58 kg/m2  SpO2 96% Physical Exam  Constitutional: She appears well-developed and well-nourished. No distress.  Morbidly obese  HENT:  Head: Normocephalic and atraumatic.  Mouth/Throat: Oropharynx is clear and moist. No oropharyngeal exudate.  Eyes: Conjunctivae and EOM are normal. Pupils are equal, round, and reactive to light. Right eye exhibits no discharge. Left eye exhibits no discharge. No scleral icterus.  Neck: Normal range of motion. Neck supple. No JVD present. No thyromegaly present.  Cardiovascular: Normal rate, regular rhythm, normal heart sounds and intact distal pulses.  Exam reveals no gallop and no friction rub.   No murmur heard. Pulmonary/Chest: Effort normal and breath sounds normal. No respiratory distress. She has no wheezes. She has no rales.  Normal sounding lungs, no rales or wheezing, no increased work of breathing  Abdominal: Soft. Bowel sounds are normal. She exhibits no distension and no mass. There is no tenderness.  Musculoskeletal: Normal range of motion. She exhibits edema. She exhibits no tenderness.  Severe bilateral lower extremity edema, lymphedema, severe, no redness, no warmth, no erythema  Lymphadenopathy:    She has no cervical adenopathy.  Neurological: She is alert. Coordination normal.  Skin: Skin is warm and dry. No rash noted. No erythema.  Psychiatric: She has a normal mood and affect. Her behavior is normal.  Nursing note and vitals reviewed.   ED Course  Procedures (including critical care time) Labs Review Labs Reviewed  CBC WITH DIFFERENTIAL - Abnormal; Notable for the following:    Hemoglobin 11.5 (*)    RDW 17.0 (*)    All other components within normal limits  COMPREHENSIVE METABOLIC PANEL - Abnormal; Notable for the following:    Chloride 94 (*)    CO2 35 (*)    Glucose, Bld 212 (*)    BUN 31 (*)    Creatinine, Ser 1.68 (*)    Total Protein 8.4 (*)    Alkaline Phosphatase 134 (*)     GFR calc non Af Amer 35 (*)    GFR calc Af Amer 41 (*)    All other components within normal limits  URINALYSIS, ROUTINE W REFLEX MICROSCOPIC - Abnormal; Notable for the following:    Hgb urine dipstick SMALL (*)    Protein, ur 100 (*)    All other components within normal limits  INFLUENZA PANEL BY PCR (TYPE A & B, H1N1)  CK  URINE MICROSCOPIC-ADD ON    Imaging Review Dg Chest Port 1 View  11/06/2014   CLINICAL DATA:  Shortness of breath for 3 days.  EXAM: PORTABLE CHEST - 1 VIEW  COMPARISON:  09/03/2012  FINDINGS: Examination is  limited by body habitus, underpenetration an overlying artifact from leads and wires.  The heart is enlarged but stable. The Port-A-Cath is stable. The lungs are grossly clear but examination is limited. No obvious pleural effusions or pneumothorax.  IMPRESSION: Limited examination due to body habitus.  Cardiac enlargement without obvious acute pulmonary findings.   Electronically Signed   By: Kalman Jewels M.D.   On: 11/06/2014 13:52     EKG Interpretation   Date/Time:  Wednesday November 06 2014 12:30:31 EST Ventricular Rate:  70 PR Interval:  148 QRS Duration: 83 QT Interval:  410 QTC Calculation: 442 R Axis:   52 Text Interpretation:  Sinus rhythm Low voltage, precordial leads Baseline  wander in lead(s) V1 Normal ECG since last tracing no significant change  Confirmed by Blakleigh Straw  MD, Tyresse Jayson ([id number]) on 11/06/2014 1:18:32 PM      MDM   Final diagnoses:  SOB (shortness of breath)    The patient has normal vital signs, her EKG is unremarkable, her chest x-ray shows no signs of pleural effusions, pulmonary edema or other significant new findings.  Her labs show no leukocytosis or anemia and she does not have rhabdomyolysis. I suspect that her symptoms are coming from a viral illness causing chills weakness fatigue nausea and myalgias. Urinalysis pending  Labs unremarkable - pt informed and is well appearing, stable for d/c.  Meds given in  ED:  Medications  oxyCODONE-acetaminophen (PERCOCET/ROXICET) 5-325 MG per tablet 2 tablet (2 tablets Oral Given 11/06/14 1405)    Discharge Medication List as of 11/06/2014  3:46 PM    START taking these medications   Details  HYDROcodone-acetaminophen (NORCO/VICODIN) 5-325 MG per tablet Take 2 tablets by mouth every 4 (four) hours as needed., Starting 11/06/2014, Until Discontinued, Print    oseltamivir (TAMIFLU) 75 MG capsule Take 1 capsule (75 mg total) by mouth every 12 (twelve) hours., Starting 11/06/2014, Until Discontinued, Print            Johnna Acosta, MD 11/07/14 2055

## 2014-11-06 NOTE — ED Notes (Signed)
In by EMS Flu like symptoms 4 hours.  CBG- 234.  VSS.

## 2014-12-02 ENCOUNTER — Emergency Department (HOSPITAL_COMMUNITY): Payer: Medicaid Other

## 2014-12-02 ENCOUNTER — Encounter (HOSPITAL_COMMUNITY): Payer: Self-pay | Admitting: Emergency Medicine

## 2014-12-02 ENCOUNTER — Emergency Department (HOSPITAL_COMMUNITY)
Admission: EM | Admit: 2014-12-02 | Discharge: 2014-12-03 | Disposition: A | Discharge status: Home / Self Care | Payer: Medicaid Other | Attending: Emergency Medicine | Admitting: Emergency Medicine

## 2014-12-02 DIAGNOSIS — J45909 Unspecified asthma, uncomplicated: Secondary | ICD-10-CM | POA: Diagnosis not present

## 2014-12-02 DIAGNOSIS — I2510 Atherosclerotic heart disease of native coronary artery without angina pectoris: Secondary | ICD-10-CM | POA: Diagnosis not present

## 2014-12-02 DIAGNOSIS — R509 Fever, unspecified: Secondary | ICD-10-CM | POA: Diagnosis not present

## 2014-12-02 DIAGNOSIS — Z794 Long term (current) use of insulin: Secondary | ICD-10-CM | POA: Diagnosis not present

## 2014-12-02 DIAGNOSIS — E780 Pure hypercholesterolemia: Secondary | ICD-10-CM | POA: Diagnosis not present

## 2014-12-02 DIAGNOSIS — I10 Essential (primary) hypertension: Secondary | ICD-10-CM | POA: Insufficient documentation

## 2014-12-02 DIAGNOSIS — Z90710 Acquired absence of both cervix and uterus: Secondary | ICD-10-CM | POA: Diagnosis not present

## 2014-12-02 DIAGNOSIS — E119 Type 2 diabetes mellitus without complications: Secondary | ICD-10-CM | POA: Diagnosis not present

## 2014-12-02 DIAGNOSIS — E662 Morbid (severe) obesity with alveolar hypoventilation: Secondary | ICD-10-CM | POA: Insufficient documentation

## 2014-12-02 DIAGNOSIS — Z79899 Other long term (current) drug therapy: Secondary | ICD-10-CM | POA: Diagnosis not present

## 2014-12-02 DIAGNOSIS — G8929 Other chronic pain: Secondary | ICD-10-CM | POA: Insufficient documentation

## 2014-12-02 DIAGNOSIS — Z9981 Dependence on supplemental oxygen: Secondary | ICD-10-CM | POA: Insufficient documentation

## 2014-12-02 DIAGNOSIS — R109 Unspecified abdominal pain: Secondary | ICD-10-CM | POA: Diagnosis not present

## 2014-12-02 DIAGNOSIS — Z87728 Personal history of other specified (corrected) congenital malformations of nervous system and sense organs: Secondary | ICD-10-CM | POA: Insufficient documentation

## 2014-12-02 DIAGNOSIS — Z872 Personal history of diseases of the skin and subcutaneous tissue: Secondary | ICD-10-CM | POA: Diagnosis not present

## 2014-12-02 HISTORY — DX: Personal history of other medical treatment: Z92.89

## 2014-12-02 HISTORY — DX: Morbid (severe) obesity with alveolar hypoventilation: E66.2

## 2014-12-02 LAB — URINALYSIS, ROUTINE W REFLEX MICROSCOPIC
Bilirubin Urine: NEGATIVE
GLUCOSE, UA: NEGATIVE mg/dL
KETONES UR: NEGATIVE mg/dL
LEUKOCYTES UA: NEGATIVE
NITRITE: NEGATIVE
PH: 7 (ref 5.0–8.0)
PROTEIN: 100 mg/dL — AB
Specific Gravity, Urine: 1.015 (ref 1.005–1.030)
Urobilinogen, UA: 0.2 mg/dL (ref 0.0–1.0)

## 2014-12-02 LAB — COMPREHENSIVE METABOLIC PANEL
ALBUMIN: 3.4 g/dL — AB (ref 3.5–5.2)
ALT: 13 U/L (ref 0–35)
AST: 20 U/L (ref 0–37)
Alkaline Phosphatase: 143 U/L — ABNORMAL HIGH (ref 39–117)
Anion gap: 9 (ref 5–15)
BUN: 20 mg/dL (ref 6–23)
CALCIUM: 9.4 mg/dL (ref 8.4–10.5)
CO2: 33 mEq/L — ABNORMAL HIGH (ref 19–32)
CREATININE: 1.33 mg/dL — AB (ref 0.50–1.10)
Chloride: 96 mEq/L (ref 96–112)
GFR calc Af Amer: 54 mL/min — ABNORMAL LOW (ref >90)
GFR calc non Af Amer: 46 mL/min — ABNORMAL LOW (ref >90)
Glucose, Bld: 111 mg/dL — ABNORMAL HIGH (ref 70–99)
Potassium: 4.5 mEq/L (ref 3.7–5.3)
SODIUM: 138 meq/L (ref 137–147)
Total Bilirubin: 0.7 mg/dL (ref 0.3–1.2)
Total Protein: 8.6 g/dL — ABNORMAL HIGH (ref 6.0–8.3)

## 2014-12-02 LAB — CBC WITH DIFFERENTIAL/PLATELET
Basophils Absolute: 0 10*3/uL (ref 0.0–0.1)
Basophils Relative: 0 % (ref 0–1)
EOS PCT: 1 % (ref 0–5)
Eosinophils Absolute: 0.1 10*3/uL (ref 0.0–0.7)
HEMATOCRIT: 38.1 % (ref 36.0–46.0)
HEMOGLOBIN: 11.7 g/dL — AB (ref 12.0–15.0)
LYMPHS ABS: 0.9 10*3/uL (ref 0.7–4.0)
LYMPHS PCT: 7 % — AB (ref 12–46)
MCH: 26.5 pg (ref 26.0–34.0)
MCHC: 30.7 g/dL (ref 30.0–36.0)
MCV: 86.2 fL (ref 78.0–100.0)
MONO ABS: 0.6 10*3/uL (ref 0.1–1.0)
MONOS PCT: 5 % (ref 3–12)
Neutro Abs: 11.9 10*3/uL — ABNORMAL HIGH (ref 1.7–7.7)
Neutrophils Relative %: 87 % — ABNORMAL HIGH (ref 43–77)
Platelets: 222 10*3/uL (ref 150–400)
RBC: 4.42 MIL/uL (ref 3.87–5.11)
RDW: 17.2 % — ABNORMAL HIGH (ref 11.5–15.5)
WBC: 13.6 10*3/uL — AB (ref 4.0–10.5)

## 2014-12-02 LAB — LACTIC ACID, PLASMA: LACTIC ACID, VENOUS: 0.9 mmol/L (ref 0.5–2.2)

## 2014-12-02 LAB — URINE MICROSCOPIC-ADD ON

## 2014-12-02 MED ORDER — ACETAMINOPHEN 500 MG PO TABS
1000.0000 mg | ORAL_TABLET | Freq: Once | ORAL | Status: AC
  Administered 2014-12-02: 1000 mg via ORAL
  Filled 2014-12-02: qty 2

## 2014-12-02 MED ORDER — SODIUM CHLORIDE 0.9 % IV SOLN
INTRAVENOUS | Status: DC
  Administered 2014-12-02: 20:00:00 via INTRAVENOUS

## 2014-12-02 NOTE — ED Notes (Signed)
Patient ambulatory to restroom with with assistance, top hat place in commode to catch urine.

## 2014-12-02 NOTE — Discharge Instructions (Signed)
Emergency Department Resource Guide 1) Find a Doctor and Pay Out of Pocket Although you won't have to find out who is covered by your insurance plan, it is a good idea to ask around and get recommendations. You will then need to call the office and see if the doctor you have chosen will accept you as a new patient and what types of options they offer for patients who are self-pay. Some doctors offer discounts or will set up payment plans for their patients who do not have insurance, but you will need to ask so you aren't surprised when you get to your appointment.  2) Contact Your Local Health Department Not all health departments have doctors that can see patients for sick visits, but many do, so it is worth a call to see if yours does. If you don't know where your local health department is, you can check in your phone book. The CDC also has a tool to help you locate your state's health department, and many state websites also have listings of all of their local health departments.  3) Find a [facility] If your illness is not likely to be very severe or complicated, you may want to try a walk in clinic. These are popping up all over the country in pharmacies, drugstores, and shopping centers. They're usually staffed by nurse practitioners or physician assistants that have been trained to treat common illnesses and complaints. They're usually fairly quick and inexpensive. However, if you have serious medical issues or chronic medical problems, these are probably not your best option.  No Primary Care Doctor: - Call Health Connect at  [phone] - they can help you locate a primary care doctor that  accepts your insurance, provides certain services, etc. - Physician Referral Service- [phone]  Chronic Pain Problems: Organization         Address  Phone   Notes  [facility]  [phone] Patients need to be referred by their primary care doctor.   Medication  Assistance: Organization         Address  Phone   Notes  [facility] Medication [facility] [address] [place], [place] [address] [phone] --Must be a resident of [place] [facility] -- Must have NO insurance coverage whatsoever (no Medicaid/ Medicare, etc.) -- The pt. MUST have a primary care doctor that directs their care regularly and follows them in the community   [facility]  [phone]   [employer]  [phone]    Agencies that provide inexpensive medical care: Organization         Address  Phone   Notes  [place]  [phone]   [facility]    [phone]   [facility] [place], [place] [address] [phone]   [place] [address] [place] [phone]   Planned Parenthood    [phone]   [facility]    [phone]   [place] and [address], [place] Phone:  [phone], Fax:  [phone] Hours of Operation:  9 am - 6 pm, M-F.  Also accepts Medicaid/Medicare and self-pay.  [facility] for [place], [address],  Phone: [phone], Fax: [phone]. Hours of Operation:  8:30 am - 5:30 pm, M-F.  Also accepts Medicaid and self-pay.  [facility] [place] [address], [place] Phone: [phone]   [place], [place], [place] [phone], Ext. [phone] Mondays & Thursdays: 7-9 AM.  First 15 patients are seen on a first come, first serve basis.    [place] Providers:  Organization         Address  Phone   Notes  [facility] [address], [place] [phone] Also accepts self-pay patients.  [facility] [address], [place]  [phone]   [place], [address], [place]  [phone]   [facility] [address], [place] [phone]   Lucianne Lei [address], [place]   [phone] Only accepts [place] Access [place] patients after they have their name applied to their card.   Self-Pay (no insurance) in [facility]:  Organization         Address  Phone   Notes  Sickle Cell Patients, [facility] [place] [phone]   [facility] [place] [phone]   [facility] [place]  [place], [place], [place] [phone]   [facility]/Dr. Osei-Bonsu  [address] [place] or [address], [place] [phone] Phone number for both [name] and [place] locations is the same.  [facility] [address] [place] [phone]   [facility] [address] [place] or [address] [phone]   [facility] [address], [place] [phone], phone; [phone], fax Sees patients 1st and 3rd Saturday of every month.  Must not qualify for public or private insurance (i.e. Medicaid, Medicare, [place] Health Choice, Veterans' Benefits)  Household income should be no more than 200% of the poverty level The clinic cannot treat you if you are pregnant or think you are pregnant  Sexually transmitted diseases are not treated at the clinic.    Dental Care: Organization         Address  Phone  Notes  [place] [facility] [place] [phone] Accepts children up to age 4 who are enrolled in [place] or [place]; pregnant women with a Medicaid card; and children who have applied for Medicaid or [place] Health Choice, but were declined, whose parents can pay a reduced fee at time of service.  [facility]  [facility]  [address], [place] [phone] Accepts children up to age 35 who are enrolled in [place] or [place]; pregnant women with a Medicaid card; and children who have applied for Medicaid or [place] Health Choice, but were declined, whose parents can pay a reduced fee at time of service.  [facility]  [place] [phone] Patients are seen by appointment only. Walk-ins are not accepted. [place] will see patients 43 years of age and older. Monday - Tuesday (8am-5pm) Most Wednesdays (8:30-5pm) $30 per visit, cash only  [facility] Adult [facility] Access PROGRAM  [address], [facility] [phone] Patients are seen by appointment only. Walk-ins are not accepted. [place] will see patients 40 years of age and older. One  Wednesday Evening (Monthly: Volunteer Based).  $30 per visit, cash only  °[facility]  [phone] for adults; Children under age 4, call Graduate Pediatric Dentistry at [phone]. Children aged 4-14, please call [phone] to request a pediatric application. ° Dental services are provided in all areas of dental care including fillings, crowns and bridges, complete and partial dentures, implants, gum treatment, root canals, and extractions. Preventive care is also provided. Treatment is provided to both adults and children. °Patients are selected via a lottery and there is often a waiting list. °  °[facility] [address], °[place] ° ([phone] [email or web address] °  °[facility] [address], [place], [place] [phone], Ext. [phone] Second and Fourth Thursday of each month, opens at 6:30 AM; Clinic ends at 9 AM.  Patients are seen on a first-come first-served basis, and a limited number are seen during each clinic.  ° °[facility] ° [address], [place], [place] [phone]    Eligibility Requirements °You must have lived in [place], [place], or [place] counties for at least the last three months. °  You cannot be eligible for state or federal sponsored healthcare insurance, including Veterans Administration, Medicaid, or Medicare. °  You generally cannot be eligible for healthcare insurance through your employer.  °  How to apply: °Eligibility screenings are held every Tuesday and Wednesday afternoon from 1:00 pm until 4:00 pm. You do not need an appointment for the interview!  °[facility] [address], [place], [place] [phone]   °[facility]  [phone]   °[facility]  [phone]   °[facility]  [phone]   ° °Behavioral Health Resources in the Community: °Intensive Outpatient Programs °Organization         Address  Phone  Notes  °[facility] [address], [place], [place] [phone]   °[facility] [address], [place], [place] [phone]   °[facility] [address], [place], [place] ° [phone]   °[facility] [address],  °[place], [place] 1-800-853-5163 or 336-641-4981   °Substance Abuse Resources °Organization         Address  Phone  Notes  °[facility]  336-882-2125   °[facility]  [phone]   °[facility]  [phone]   °[facility]  336-845-3988   °Residential & Outpatient Substance Abuse Program  1-800-659-3381   °Psychological Services °Organization         Address  Phone  Notes  °[facility]  336- [phone]   °[facility]  336- [phone]   °[facility] [address], [place] 1-800-853-5163 or 336-641-4981   ° °Mobile Crisis Teams °Organization         Address  Phone  Notes  °[facility], Mobile Crisis Care Unit  1-877-626-1772   °Assertive °[facility]otherapeutic Services ° [address]  [place], [place] [phone]   °Sharon DeEsch [address] °[place] [phone]   ° °Self-Help/Support Groups °Organization         Address  Phone             Notes  °[facility] [place] - variety of support groups  336- [phone] Call for more information  °Narcotics Anonymous (NA), [facility] [address], °[place]  2 meetings at this location  ° °  Residential Treatment Programs Organization         Address  Phone  Notes  [facility] [address],    [place]  [phone]   [facility]  [address] [place] [address], [place], [place]   [place], [place] [phone] Admissions: 8am-3pm M-F  [facility]stance [place] [address]    [place], [place] [phone]   [facility] [address] [place], [place], [place]   [facility] [address]  [place], [place]   [facility] - Intensive Outpatient [address], [place], [place]   [facility] [address]  [place], [place] [phone]-([phone] or [phone]   [facility] ([facility]) [address] [place], [place] Accepts Medicaid  [facility] [address]  [place] [phone] Substance Abuse/Addiction Treatment   [facility] Organization         Address  Phone  Notes  [facility]  [phone]   Domenic Schwab, PhD [address] [place]lis [place], [place]   [phone] or [phone]   [place], [place] [phone]   [facility] [address] [place], [place] [phone] Insurance/Medicaid/sponsorship through [facility] [address]                                    [place], [place] [phone] [place] [address], [place] [phone]    Dr. Adele Schilder  [phone]   [facility] Dept. 1) [address], [place] 2) [place] 3)  [address], [place] [phone]  [phone]   [place] [phone] or [phone] (After Hours)      Take your usual prescriptions as previously directed. Take over the counter tylenol, as directed on packaging, as needed for discomfort or fevers.  Call your regular medical doctor tomorrow to schedule a follow up appointment within the next 2 days.  Return to the Emergency Department immediately sooner if worsening.

## 2014-12-02 NOTE — ED Notes (Signed)
Patient arrives from home with c/o bilateral flank pain and fever since yesterday. Febrile upon arrival. Alert.

## 2014-12-02 NOTE — ED Provider Notes (Signed)
CSN: [id number]     Arrival date & time 12/02/14  1842 History   First MD Initiated Contact with Patient 12/02/14 1845     Chief Complaint  Patient presents with  . Flank Pain      HPI Pt was seen at 1910. Per pt, c/o gradual onset and persistence of constant bilat flank "pain" for the past 2 days. Has been associated with subjective home fevers/chills and mild nausea. Denies any other symptoms. Denies vomiting/diarrhea, no abd pain, no dysuria/hemtuaria, no rash, no CP/SOB, no cough, no sore throat.    Past Medical History  Diagnosis Date  . Diabetes mellitus   . Chronic pain   . Lymph edema   . Cellulitis   . Chiari malformation     s/p surgery  . S/P colonoscopy 05/26/2007    Dr. Laural [place] sigmoid diverticulosis random biopsies benign  . CAD (coronary artery disease)   . Hypercholesterolemia   . Hypertension   . Asthma   . S/P endoscopy 05/01/2009    Dr. Penelope Coop pill-induced esophageal ulcerations distal to midesophagus, 2 small ulcers in the antrum of the stomach  . Fibromyalgia   . On home O2   . Obesity hypoventilation syndrome   . Hx of echocardiogram 10/2011    EF 55-60%   Past Surgical History  Procedure Laterality Date  . Cesarean section       x 2  . Abdominal hysterectomy    . Tonsillectomy    . Adenoidectomy    . Thyroidectomy, partial    . Craniectomy suboccipital w/ cervical laminectomy / chiari    . Portacath placement  07/05/2012    Procedure: INSERTION PORT-A-CATH;  Surgeon: Donato Heinz, MD;  Location: [facility];  Service: General;  Laterality: Left;  subclavian   Family History  Problem Relation Age of Onset  . Colon cancer Mother 53  . Stroke Mother 47  . Coronary artery disease Mother    History  Substance Use Topics  . Smoking status: Never Smoker   . Smokeless tobacco: Never Used  . Alcohol Use: No    Review of Systems ROS: Statement: All systems negative except as marked or noted in the HPI; Constitutional: +fever and chills. ; ; Eyes:  Negative for eye pain, redness and discharge. ; ; ENMT: Negative for ear pain, hoarseness, nasal congestion, sinus pressure and sore throat. ; ; Cardiovascular: Negative for chest pain, palpitations, diaphoresis, dyspnea and peripheral edema. ; ; Respiratory: Negative for cough, wheezing and stridor. ; ; Gastrointestinal: +nausea. Negative for vomiting, diarrhea, abdominal pain, blood in stool, hematemesis, jaundice and rectal bleeding. . ; ; Genitourinary: +flank pain. Negative for dysuria and hematuria. ; ; Musculoskeletal: Negative for back pain and neck pain. Negative for swelling and trauma.; ; Skin: Negative for pruritus, rash, abrasions, blisters, bruising and skin lesion.; ; Neuro: Negative for headache, lightheadedness and neck stiffness. Negative for weakness, altered level of consciousness , altered mental status, extremity weakness, paresthesias, involuntary movement, seizure and syncope.      Allergies  Vancomycin; Contrast media; Pineapple; Pneumococcal vaccines; and Shellfish allergy  Home Medications   Prior to Admission medications   Medication Sig Start Date End Date Taking? Authorizing Provider  ALPRAZolam Duanne Moron) 0.5 MG tablet Take 0.5 mg by mouth 3 (three) times daily as needed for anxiety or sleep.    Yes Historical Provider, MD  amLODipine (NORVASC) 10 MG tablet Take 10 mg by mouth daily.   Yes Historical Provider, MD  docusate sodium (COLACE) 100 MG  capsule Take 100 mg by mouth 2 (two) times daily as needed. For constipation   Yes Historical Provider, MD  insulin NPH-insulin regular (NOVOLIN 70/30) (70-30) 100 UNIT/ML injection Inject 60 Units into the skin 2 (two) times daily.   Yes Historical Provider, MD  Magnesium 250 MG TABS Take 250 mg by mouth daily.   Yes Historical Provider, MD  metoprolol (LOPRESSOR) 50 MG tablet Take 50 mg by mouth 2 (two) times daily.   Yes Historical Provider, MD  Oxycodone HCl 10 MG TABS Take 10 mg by mouth 4 (four) times daily.   Yes Historical  Provider, MD  potassium chloride SA (K-DUR,KLOR-CON) 20 MEQ tablet Take 20 mEq by mouth 3 (three) times daily.   Yes Historical Provider, MD  torsemide (DEMADEX) 100 MG tablet Take 100 mg by mouth 2 (two) times daily.    Yes Historical Provider, MD  acetaminophen (TYLENOL) 325 MG tablet Take 650 mg by mouth every 6 (six) hours as needed. fever    Historical Provider, MD  diphenhydrAMINE (BENADRYL) 25 mg capsule Take 25 mg by mouth every 6 (six) hours as needed for allergies.    Historical Provider, MD  HYDROcodone-acetaminophen (NORCO/VICODIN) 5-325 MG per tablet Take 2 tablets by mouth every 4 (four) hours as needed. Patient not taking: Reported on 12/02/2014 11/06/14   Johnna Acosta, MD  oseltamivir (TAMIFLU) 75 MG capsule Take 1 capsule (75 mg total) by mouth every 12 (twelve) hours. Patient not taking: Reported on 12/02/2014 11/06/14   Johnna Acosta, MD   BP 151/95 mmHg  Pulse 77  Temp(Src) 101.8 F (38.8 C) (Oral)  Resp 22  Wt 350 lb (158.759 kg)  SpO2 95% Physical Exam  1915: Physical examination:  Nursing notes reviewed; Vital signs and O2 SAT reviewed;  Constitutional: Well developed, Well nourished, Well hydrated, In no acute distress; Head:  Normocephalic, atraumatic; Eyes: EOMI, PERRL, No scleral icterus; ENMT: Mouth and pharynx normal, Mucous membranes moist; Neck: Supple, Full range of motion, No lymphadenopathy; Cardiovascular: Regular rate and rhythm, No gallop; Respiratory: Breath sounds clear & equal bilaterally, No wheezes.  Speaking full sentences with ease, Normal respiratory effort/excursion; Chest: Nontender, Movement normal; Abdomen: Soft, Nontender, Nondistended, Normal bowel sounds; Genitourinary: No CVA tenderness; Spine:  No midline CS, TS, LS tenderness. +TTP bilat lumbar paraspinal muscles. No rash.;; Extremities: Pulses normal, No tenderness, +bilat LE chronic lymphedema.; Neuro: AA&Ox3, Major CN grossly intact.  Speech clear. No gross focal motor or sensory  deficits in extremities.; Skin: Color normal, Warm, Dry.   ED Course  Procedures    EKG Interpretation None      MDM  MDM Reviewed: previous chart, nursing note and vitals Reviewed previous: labs Interpretation: labs, x-ray and CT scan     Results for orders placed or performed during the hospital encounter of 12/02/14  Urinalysis, Routine w reflex microscopic  Result Value Ref Range   Color, Urine YELLOW YELLOW   APPearance CLEAR CLEAR   Specific Gravity, Urine 1.015 1.005 - 1.030   pH 7.0 5.0 - 8.0   Glucose, UA NEGATIVE NEGATIVE mg/dL   Hgb urine dipstick SMALL (A) NEGATIVE   Bilirubin Urine NEGATIVE NEGATIVE   Ketones, ur NEGATIVE NEGATIVE mg/dL   Protein, ur 100 (A) NEGATIVE mg/dL   Urobilinogen, UA 0.2 0.0 - 1.0 mg/dL   Nitrite NEGATIVE NEGATIVE   Leukocytes, UA NEGATIVE NEGATIVE  Comprehensive metabolic panel  Result Value Ref Range   Sodium 138 137 - 147 mEq/L   Potassium 4.5 3.7 -  5.3 mEq/L   Chloride 96 96 - 112 mEq/L   CO2 33 (H) 19 - 32 mEq/L   Glucose, Bld 111 (H) 70 - 99 mg/dL   BUN 20 6 - 23 mg/dL   Creatinine, Ser 1.33 (H) 0.50 - 1.10 mg/dL   Calcium 9.4 8.4 - 10.5 mg/dL   Total Protein 8.6 (H) 6.0 - 8.3 g/dL   Albumin 3.4 (L) 3.5 - 5.2 g/dL   AST 20 0 - 37 U/L   ALT 13 0 - 35 U/L   Alkaline Phosphatase 143 (H) 39 - 117 U/L   Total Bilirubin 0.7 0.3 - 1.2 mg/dL   GFR calc non Af Amer 46 (L) >90 mL/min   GFR calc Af Amer 54 (L) >90 mL/min   Anion gap 9 5 - 15  CBC with Differential  Result Value Ref Range   WBC 13.6 (H) 4.0 - 10.5 K/uL   RBC 4.42 3.87 - 5.11 MIL/uL   Hemoglobin 11.7 (L) 12.0 - 15.0 g/dL   HCT 38.1 36.0 - 46.0 %   MCV 86.2 78.0 - 100.0 fL   MCH 26.5 26.0 - 34.0 pg   MCHC 30.7 30.0 - 36.0 g/dL   RDW 17.2 (H) 11.5 - 15.5 %   Platelets 222 150 - 400 K/uL   Neutrophils Relative % 87 (H) 43 - 77 %   Neutro Abs 11.9 (H) 1.7 - 7.7 K/uL   Lymphocytes Relative 7 (L) 12 - 46 %   Lymphs Abs 0.9 0.7 - 4.0 K/uL   Monocytes Relative  5 3 - 12 %   Monocytes Absolute 0.6 0.1 - 1.0 K/uL   Eosinophils Relative 1 0 - 5 %   Eosinophils Absolute 0.1 0.0 - 0.7 K/uL   Basophils Relative 0 0 - 1 %   Basophils Absolute 0.0 0.0 - 0.1 K/uL  Lactic acid, plasma  Result Value Ref Range   Lactic Acid, Venous 0.9 0.5 - 2.2 mmol/L  Urine microscopic-add on  Result Value Ref Range   Squamous Epithelial / LPF MANY (A) RARE   WBC, UA 3-6 <3 WBC/hpf   RBC / HPF 3-6 <3 RBC/hpf   Bacteria, UA FEW (A) RARE   Ct Abdomen Pelvis Wo Contrast 12/02/2014   CLINICAL DATA:  Bilateral flank pain.  EXAM: CT ABDOMEN AND PELVIS WITHOUT CONTRAST  TECHNIQUE: Multidetector CT imaging of the abdomen and pelvis was performed following the standard protocol without IV contrast.  COMPARISON:  CT scan of April 04, 2013.  FINDINGS: No significant abnormality is noted in the visualized lung bases. No significant osseous abnormality is noted.  No gallstones are noted. Stable hepatomegaly. No focal abnormality is noted in the liver, spleen or pancreas on these unenhanced images. Adrenal glands appear normal. No hydronephrosis or renal obstruction is noted. No renal or ureteral calculi are noted. There is no evidence of bowel obstruction. Urinary bladder appears normal. Status post hysterectomy. Stable right ovarian cyst. Left ovary appears normal.  No abnormal fluid collection is noted. Stable retroperitoneal adenopathy is noted.  IMPRESSION: No hydronephrosis or renal obstruction is noted. No renal or ureteral calculi are noted.  Stable retroperitoneal adenopathy compared to prior exam.   Electronically Signed   By: Sabino Dick M.D.   On: 12/02/2014 22:40   Dg Chest 2 View 12/02/2014   CLINICAL DATA:  Bilateral flank pain and fever for 1 day  EXAM: CHEST  2 VIEW  COMPARISON:  11/06/2014  FINDINGS: Stable left subclavian Port-A-Cath device. Moderate cardiomegaly. Vascular  congestion. No definite consolidation or edema.  IMPRESSION: Cardiomegaly and vascular congestion.    Electronically Signed   By: Maryclare Bean M.D.   On: 12/02/2014 20:41    2330:  BUN/Cr elevated but improved from baseline. APAP only given for fever given hx of CKD. No clear UTI on Udip; UC is pending. CXR suggests vascular congestion, but pt is without hx of CHF and had normal echocardiogram in 2012; doubt acute CHF today. Workup otherwise reassuring. Pt has tol PO well while in the ED without N/V. No stooling while in the ED. Pt has ambulated with steady upright gait, resps easy, NAD. Pt states she wants to go home now. Dx and testing d/w pt and family.  Questions answered.  Verb understanding, agreeable to d/c home with outpt f/u.   Francine Graven, DO 12/04/14 [phone]

## 2014-12-04 LAB — URINE CULTURE: Colony Count: 50000

## 2015-09-11 ENCOUNTER — Encounter (HOSPITAL_COMMUNITY): Payer: Self-pay | Admitting: Emergency Medicine

## 2015-09-11 ENCOUNTER — Emergency Department (HOSPITAL_COMMUNITY): Payer: Medicaid Other

## 2015-09-11 ENCOUNTER — Emergency Department (HOSPITAL_COMMUNITY)
Admission: EM | Admit: 2015-09-11 | Discharge: 2015-09-11 | Disposition: A | Discharge status: Home / Self Care | Payer: Medicaid Other | Attending: Emergency Medicine | Admitting: Emergency Medicine

## 2015-09-11 DIAGNOSIS — Y998 Other external cause status: Secondary | ICD-10-CM | POA: Insufficient documentation

## 2015-09-11 DIAGNOSIS — G8929 Other chronic pain: Secondary | ICD-10-CM | POA: Insufficient documentation

## 2015-09-11 DIAGNOSIS — M25561 Pain in right knee: Secondary | ICD-10-CM

## 2015-09-11 DIAGNOSIS — Y9289 Other specified places as the place of occurrence of the external cause: Secondary | ICD-10-CM | POA: Insufficient documentation

## 2015-09-11 DIAGNOSIS — T148XXA Other injury of unspecified body region, initial encounter: Secondary | ICD-10-CM

## 2015-09-11 DIAGNOSIS — S8991XA Unspecified injury of right lower leg, initial encounter: Secondary | ICD-10-CM | POA: Insufficient documentation

## 2015-09-11 DIAGNOSIS — S8992XA Unspecified injury of left lower leg, initial encounter: Secondary | ICD-10-CM | POA: Insufficient documentation

## 2015-09-11 DIAGNOSIS — J45909 Unspecified asthma, uncomplicated: Secondary | ICD-10-CM | POA: Insufficient documentation

## 2015-09-11 DIAGNOSIS — S0083XA Contusion of other part of head, initial encounter: Secondary | ICD-10-CM

## 2015-09-11 DIAGNOSIS — Z87728 Personal history of other specified (corrected) congenital malformations of nervous system and sense organs: Secondary | ICD-10-CM | POA: Insufficient documentation

## 2015-09-11 DIAGNOSIS — Z8739 Personal history of other diseases of the musculoskeletal system and connective tissue: Secondary | ICD-10-CM | POA: Insufficient documentation

## 2015-09-11 DIAGNOSIS — Z9981 Dependence on supplemental oxygen: Secondary | ICD-10-CM | POA: Insufficient documentation

## 2015-09-11 DIAGNOSIS — I10 Essential (primary) hypertension: Secondary | ICD-10-CM | POA: Insufficient documentation

## 2015-09-11 DIAGNOSIS — M25562 Pain in left knee: Secondary | ICD-10-CM

## 2015-09-11 DIAGNOSIS — W19XXXA Unspecified fall, initial encounter: Secondary | ICD-10-CM

## 2015-09-11 DIAGNOSIS — E119 Type 2 diabetes mellitus without complications: Secondary | ICD-10-CM | POA: Insufficient documentation

## 2015-09-11 DIAGNOSIS — I2510 Atherosclerotic heart disease of native coronary artery without angina pectoris: Secondary | ICD-10-CM | POA: Insufficient documentation

## 2015-09-11 DIAGNOSIS — W01198A Fall on same level from slipping, tripping and stumbling with subsequent striking against other object, initial encounter: Secondary | ICD-10-CM | POA: Insufficient documentation

## 2015-09-11 DIAGNOSIS — S90812A Abrasion, left foot, initial encounter: Secondary | ICD-10-CM | POA: Insufficient documentation

## 2015-09-11 DIAGNOSIS — Y9301 Activity, walking, marching and hiking: Secondary | ICD-10-CM | POA: Insufficient documentation

## 2015-09-11 DIAGNOSIS — Z872 Personal history of diseases of the skin and subcutaneous tissue: Secondary | ICD-10-CM | POA: Insufficient documentation

## 2015-09-11 DIAGNOSIS — Z23 Encounter for immunization: Secondary | ICD-10-CM | POA: Insufficient documentation

## 2015-09-11 MED ORDER — OXYCODONE-ACETAMINOPHEN 5-325 MG PO TABS
1.0000 | ORAL_TABLET | Freq: Once | ORAL | Status: AC
  Administered 2015-09-11: 1 via ORAL
  Filled 2015-09-11: qty 1

## 2015-09-11 MED ORDER — OXYCODONE-ACETAMINOPHEN 5-325 MG PO TABS: 1.0000 | ORAL_TABLET | Freq: Four times a day (QID) | ORAL | Status: DC | PRN

## 2015-09-11 MED ORDER — HYDROGEN PEROXIDE 3 % EX SOLN
CUTANEOUS | Status: AC
  Filled 2015-09-11: qty 473

## 2015-09-11 MED ORDER — TETANUS-DIPHTH-ACELL PERTUSSIS 5-2.5-18.5 LF-MCG/0.5 IM SUSP
0.5000 mL | Freq: Once | INTRAMUSCULAR | Status: AC
  Administered 2015-09-11: 0.5 mL via INTRAMUSCULAR
  Filled 2015-09-11: qty 0.5

## 2015-09-11 MED ORDER — BACITRACIN ZINC 500 UNIT/GM EX OINT
TOPICAL_OINTMENT | CUTANEOUS | Status: AC
  Filled 2015-09-11: qty 0.9

## 2015-09-11 NOTE — ED Notes (Signed)
Patient reports fell forward into brick wall tonight. States she slipped when trying to use potty chair. Hematoma noted above left eye. Skin tear noted to left lower leg. Patient complaining of pain to head and lower legs bilaterally.

## 2015-09-11 NOTE — ED Notes (Signed)
Patient ambulated 4 steps, turned around and sat down in a chair. MD aware.

## 2015-09-11 NOTE — ED Notes (Signed)
Patient ambulated in room tolerated well. O2 _0 % afterward.

## 2015-09-11 NOTE — Discharge Instructions (Signed)
You were seen today after a fall. He had evidence of a hematoma to your forehead. Exam is reassuring. No imaging was indicated at the time of your visit. If you develop vomiting, worsening headache, or any new or worsening symptoms she should be reevaluated. You also noted to have an abrasion on the left foot. Apply bacitracin and monitor for signs and symptoms of infection.  Abrasion An abrasion is a cut or scrape of the skin. Abrasions do not extend through all layers of the skin and most heal within 10 days. It is important to care for your abrasion properly to prevent infection. CAUSES  Most abrasions are caused by falling on, or gliding across, the ground or other surface. When your skin rubs on something, the outer and inner layer of skin rubs off, causing an abrasion. DIAGNOSIS  Your caregiver will be able to diagnose an abrasion during a physical exam.  TREATMENT  Your treatment depends on how large and deep the abrasion is. Generally, your abrasion will be cleaned with water and a mild soap to remove any dirt or debris. An antibiotic ointment may be put over the abrasion to prevent an infection. A bandage (dressing) may be wrapped around the abrasion to keep it from getting dirty.  You may need a tetanus shot if:  You cannot remember when you had your last tetanus shot.  You have never had a tetanus shot.  The injury broke your skin. If you get a tetanus shot, your arm may swell, get red, and feel warm to the touch. This is common and not a problem. If you need a tetanus shot and you choose not to have one, there is a rare chance of getting tetanus. Sickness from tetanus can be serious.  HOME CARE INSTRUCTIONS   If a dressing was applied, change it at least once a day or as directed by your caregiver. If the bandage sticks, soak it off with warm water.   Wash the area with water and a mild soap to remove all the ointment 2 times a day. Rinse off the soap and pat the area dry with a  clean towel.   Reapply any ointment as directed by your caregiver. This will help prevent infection and keep the bandage from sticking. Use gauze over the wound and under the dressing to help keep the bandage from sticking.   Change your dressing right away if it becomes wet or dirty.   Only take over-the-counter or prescription medicines for pain, discomfort, or fever as directed by your caregiver.   Follow up with your caregiver within 24-48 hours for a wound check, or as directed. If you were not given a wound-check appointment, look closely at your abrasion for redness, swelling, or pus. These are signs of infection. SEEK IMMEDIATE MEDICAL CARE IF:   You have increasing pain in the wound.   You have redness, swelling, or tenderness around the wound.   You have pus coming from the wound.   You have a fever or persistent symptoms for more than 2-3 days.  You have a fever and your symptoms suddenly get worse.  You have a bad smell coming from the wound or dressing.  MAKE SURE YOU:   Understand these instructions.  Will watch your condition.  Will get help right away if you are not doing well or get worse. Document Released: 09/15/2005 Document Revised: 11/22/2012 Document Reviewed: 11/09/2011 [facility] Patient Information 2015 [place], [place]. This information is not intended to replace advice  given to you by your health care provider. Make sure you discuss any questions you have with your health care provider. Head Injury You have received a head injury. It does not appear serious at this time. Headaches and vomiting are common following head injury. It should be easy to awaken from sleeping. Sometimes it is necessary for you to stay in the emergency department for a while for observation. Sometimes admission to the hospital may be needed. After injuries such as yours, most problems occur within the first 24 hours, but side effects may occur up to 7-10 days after the injury.  It is important for you to carefully monitor your condition and contact your health care provider or seek immediate medical care if there is a change in your condition. WHAT ARE THE TYPES OF HEAD INJURIES? Head injuries can be as minor as a bump. Some head injuries can be more severe. More severe head injuries include:  A jarring injury to the brain (concussion).  A bruise of the brain (contusion). This mean there is bleeding in the brain that can cause swelling.  A cracked skull (skull fracture).  Bleeding in the brain that collects, clots, and forms a bump (hematoma). WHAT CAUSES A HEAD INJURY? A serious head injury is most likely to happen to someone who is in a car wreck and is not wearing a seat belt. Other causes of major head injuries include bicycle or motorcycle accidents, sports injuries, and falls. HOW ARE HEAD INJURIES DIAGNOSED? A complete history of the event leading to the injury and your current symptoms will be helpful in diagnosing head injuries. Many times, pictures of the brain, such as CT or MRI are needed to see the extent of the injury. Often, an overnight hospital stay is necessary for observation.  WHEN SHOULD I SEEK IMMEDIATE MEDICAL CARE?  You should get help right away if:  You have confusion or drowsiness.  You feel sick to your stomach (nauseous) or have continued, forceful vomiting.  You have dizziness or unsteadiness that is getting worse.  You have severe, continued headaches not relieved by medicine. Only take over-the-counter or prescription medicines for pain, fever, or discomfort as directed by your health care provider.  You do not have normal function of the arms or legs or are unable to walk.  You notice changes in the black spots in the center of the colored part of your eye (pupil).  You have a clear or bloody fluid coming from your nose or ears.  You have a loss of vision. During the next 24 hours after the injury, you must stay with someone  who can watch you for the warning signs. This person should contact local emergency services (911 in the [place] if you have seizures, you become unconscious, or you are unable to wake up. HOW CAN I PREVENT A HEAD INJURY IN THE FUTURE? The most important factor for preventing major head injuries is avoiding motor vehicle accidents. To minimize the potential for damage to your head, it is crucial to wear seat belts while riding in motor vehicles. Wearing helmets while bike riding and playing collision sports (like football) is also helpful. Also, avoiding dangerous activities around the house will further help reduce your risk of head injury.  WHEN CAN I RETURN TO NORMAL ACTIVITIES AND ATHLETICS? You should be reevaluated by your health care provider before returning to these activities. If you have any of the following symptoms, you should not return to activities or contact sports until  1 week after the symptoms have stopped:  Persistent headache.  Dizziness or vertigo.  Poor attention and concentration.  Confusion.  Memory problems.  Nausea or vomiting.  Fatigue or tire easily.  Irritability.  Intolerant of bright lights or loud noises.  Anxiety or depression.  Disturbed sleep. MAKE SURE YOU:   Understand these instructions.  Will watch your condition.  Will get help right away if you are not doing well or get worse. Document Released: 12/06/2005 Document Revised: 12/11/2013 Document Reviewed: 08/13/2013 [facility] Patient Information 2015 [place], [place]. This information is not intended to replace advice given to you by your health care provider. Make sure you discuss any questions you have with your health care provider.

## 2015-09-11 NOTE — ED Provider Notes (Signed)
CSN: [id number]     Arrival date & time 09/11/15  0125 History   First MD Initiated Contact with Patient 09/11/15 0129     Chief Complaint  Patient presents with  . Fall     (Consider location/radiation/quality/duration/timing/severity/associated sxs/prior Treatment) HPI  This is a 49 year old female with history of diabetes, lymphedema, chronic pain. Presents following a fall. Patient reports that she was walking back to get into bed when she fell forward hitting her head on the upper wall. She reports a mechanical fall. Denies syncope or loss of consciousness. Denies any vomiting following a fall. She is not on any anticoagulate. Patient reports that she fell onto her knees and was unable to get up. At baseline she walks with a cane and has difficulty with mobility secondary to her edema for legs. Unknown last tetanus shot. Currently she is complaining of 10 out of 10 headache and leg pain. Denies any vision changes. She noted a cut her left foot.  Past Medical History  Diagnosis Date  . Diabetes mellitus   . Chronic pain   . Lymph edema   . Cellulitis   . Chiari malformation     s/p surgery  . S/P colonoscopy 05/26/2007    Dr. Laural Golden sigmoid diverticulosis random biopsies benign  . CAD (coronary artery disease)   . Hypercholesterolemia   . Hypertension   . Asthma   . S/P endoscopy 05/01/2009    Dr. Penelope Coop pill-induced esophageal ulcerations distal to midesophagus, 2 small ulcers in the antrum of the stomach  . Fibromyalgia   . On home O2   . Obesity hypoventilation syndrome   . Hx of echocardiogram 10/2011    EF 55-60%   Past Surgical History  Procedure Laterality Date  . Cesarean section       x 2  . Abdominal hysterectomy    . Tonsillectomy    . Adenoidectomy    . Thyroidectomy, partial    . Craniectomy suboccipital w/ cervical laminectomy / chiari    . Portacath placement  07/05/2012    Procedure: INSERTION PORT-A-CATH;  Surgeon: Donato Heinz, MD;  Location: [facility];  Service: General;  Laterality: Left;  subclavian   Family History  Problem Relation Age of Onset  . Colon cancer Mother 31  . Stroke Mother 48  . Coronary artery disease Mother    Social History  Substance Use Topics  . Smoking status: Never Smoker   . Smokeless tobacco: Never Used  . Alcohol Use: No   OB History    No data available     Review of Systems  Constitutional: Negative for fever.  Respiratory: Negative for chest tightness and shortness of breath.   Cardiovascular: Negative for chest pain.  Gastrointestinal: Negative for nausea, vomiting and abdominal pain.  Genitourinary: Negative for dysuria.  Musculoskeletal: Negative for back pain and neck pain.       Bilateral knee pain  Skin: Positive for wound.  Neurological: Positive for headaches.  All other systems reviewed and are negative.     Allergies  Vancomycin; Contrast media; Pineapple; Pneumococcal vaccines; and Shellfish allergy  Home Medications   Prior to Admission medications   Medication Sig Start Date End Date Taking? Authorizing Siera [place]yersdorf  acetaminophen (TYLENOL) 325 MG tablet Take 650 mg by mouth every 6 (six) hours as needed. fever    Historical Delores Edelstein, MD  ALPRAZolam Duanne Moron) 0.5 MG tablet Take 0.5 mg by mouth 3 (three) times daily as needed for anxiety or sleep.  Historical Kalei Meda, MD  amLODipine (NORVASC) 10 MG tablet Take 10 mg by mouth daily.    Historical Zamorah Ailes, MD  diphenhydrAMINE (BENADRYL) 25 mg capsule Take 25 mg by mouth every 6 (six) hours as needed for allergies.    Historical Stewart Sasaki, MD  docusate sodium (COLACE) 100 MG capsule Take 100 mg by mouth 2 (two) times daily as needed. For constipation    Historical Khadejah Son, MD  HYDROcodone-acetaminophen (NORCO/VICODIN) 5-325 MG per tablet Take 2 tablets by mouth every 4 (four) hours as needed. Patient not taking: Reported on 12/02/2014 11/06/14   Noemi Chapel, MD  insulin NPH-insulin regular (NOVOLIN 70/30) (70-30) 100  UNIT/ML injection Inject 60 Units into the skin 2 (two) times daily.    Historical Rilynne Lonsway, MD  Magnesium 250 MG TABS Take 250 mg by mouth daily.    Historical Brooklee Michelin, MD  metoprolol (LOPRESSOR) 50 MG tablet Take 50 mg by mouth 2 (two) times daily.    Historical Nekisha Mcdiarmid, MD  oseltamivir (TAMIFLU) 75 MG capsule Take 1 capsule (75 mg total) by mouth every 12 (twelve) hours. Patient not taking: Reported on 12/02/2014 11/06/14   Noemi Chapel, MD  Oxycodone HCl 10 MG TABS Take 10 mg by mouth 4 (four) times daily.    Historical Sharia Averitt, MD  oxyCODONE-acetaminophen (PERCOCET/ROXICET) 5-325 MG per tablet Take 1 tablet by mouth every 6 (six) hours as needed for severe pain. 09/11/15   Merryl Hacker, MD  potassium chloride SA (K-DUR,KLOR-CON) 20 MEQ tablet Take 20 mEq by mouth 3 (three) times daily.    Historical Donnabelle Blanchard, MD  torsemide (DEMADEX) 100 MG tablet Take 100 mg by mouth 2 (two) times daily.     Historical Zalan Shidler, MD   BP 148/73 mmHg  Pulse 82  Temp(Src) 98.1 F (36.7 C) (Oral)  Resp 17  Ht _0  (1.651 m)  Wt 350 lb (158.759 kg)  BMI 58.24 kg/m2  SpO2 95% Physical Exam  Constitutional: She is oriented to person, place, and time. No distress.  Morbidly obese  HENT:  Mouth/Throat: Oropharynx is clear and moist.  3 x 3 cm hematoma noted over the mid to left forehead, no overlying abrasion, no raccoon eyes or Battle sign noted, midface stable  Eyes: Pupils are equal, round, and reactive to light.  Neck: Normal range of motion. Neck supple.  No midline step-off or deformity  Cardiovascular: Normal rate, regular rhythm and normal heart sounds.   Pulmonary/Chest: No respiratory distress. She has no wheezes.  Exam limited by body habitus, nasal cannula in place, normal effort, no respiratory distress,  fair air movement  Abdominal: Soft. There is no tenderness.  Musculoskeletal:  Lower extremity exam is limited secondary to severe lymphedema, range of motion bilateral knees and  hips appears intact, no obvious deformities  Neurological: She is alert and oriented to person, place, and time.  Skin: No erythema.  1 x 1 cm abrasion over the dorsum of the left foot, oozing noted  Psychiatric: She has a normal mood and affect.  Nursing note and vitals reviewed.   ED Course  Procedures (including critical care time) Labs Review Labs Reviewed - No data to display  Imaging Review Dg Knee Complete 4 Views Left  09/11/2015   CLINICAL DATA:  BILATERAL knee pain, fell this morning, history obesity, lymphedema, diabetes mellitus, hypertension  EXAM: LEFT KNEE - COMPLETE 4+ VIEW  COMPARISON:  None  FINDINGS: Exam limited by body habitus.  Scattered soft tissue swelling.  Medial compartment joint space narrowing and minimal spur  formation.  No definite acute fracture, dislocation or bone destruction on nonstandard imaging.  IMPRESSION: No definite acute osseous abnormalities.  Degenerative changes.   Electronically Signed   By: Lavonia Dana M.D.   On: 09/11/2015 02:37   Dg Knee Complete 4 Views Right  09/11/2015   CLINICAL DATA:  Obesity, lymphedema, fell this morning, BILATERAL knee pain, diabetes mellitus, hypertension  EXAM: RIGHT KNEE - COMPLETE 4+ VIEW  COMPARISON:  None  FINDINGS: Medial compartment joint space narrowing.  Question mild widening of the lateral compartment versus projectional artifact.  Diffuse soft tissue swelling.  Exam limited by body habitus.  No gross evidence of fracture or dislocation.  IMPRESSION: Degenerative changes and regional soft tissue swelling without definite acute bony abnormalities.   Electronically Signed   By: Lavonia Dana M.D.   On: 09/11/2015 02:36   I have personally reviewed and evaluated these images and lab results as part of my medical decision-making.   EKG Interpretation None      MDM   Final diagnoses:  Fall, initial encounter  Traumatic hematoma of forehead, initial encounter  Abrasion  Bilateral knee pain    Patient  presents following a fall. Nontoxic on exam. ABCs intact. Patient is difficult to examine given the severe lymphedema of the lower extremities. She does have a notable hematoma to the forehead and a small abrasion to her left foot. Per [place] CT head rules, CT imaging is not indicated as patient would be low risk for bleed.  Plain films of bilateral knees obtained. Left foot wound was washed and dressed. Discussed with patient that while normally I might place one suture, given history of diabetes and extensive lymphedema, I feel this would put her at increased risk for infection. Plain films are negative. Patient reports some improvement of headache Percocet. She ambulated at her baseline as she normally walks with a cane. Denies any other pain. Will discharge patient with a short course of pain medication. Ice to hematoma to forehead.  After history, exam, and medical workup I feel the patient has been appropriately medically screened and is safe for discharge home. Pertinent diagnoses were discussed with the patient. Patient was given return precautions.     Merryl Hacker, MD 09/11/15 [phone]

## 2015-11-13 ENCOUNTER — Inpatient Hospital Stay (HOSPITAL_COMMUNITY)
Admission: EM | Admit: 2015-11-13 | Discharge: 2015-12-05 | DRG: 602 | Disposition: A | Discharge status: Home Health | Payer: Medicaid Other | Attending: Pulmonary Disease | Admitting: Pulmonary Disease

## 2015-11-13 ENCOUNTER — Encounter (HOSPITAL_COMMUNITY): Payer: Self-pay

## 2015-11-13 ENCOUNTER — Emergency Department (HOSPITAL_COMMUNITY): Payer: Medicaid Other

## 2015-11-13 DIAGNOSIS — J9620 Acute and chronic respiratory failure, unspecified whether with hypoxia or hypercapnia: Secondary | ICD-10-CM | POA: Diagnosis not present

## 2015-11-13 DIAGNOSIS — N19 Unspecified kidney failure: Secondary | ICD-10-CM | POA: Diagnosis not present

## 2015-11-13 DIAGNOSIS — I132 Hypertensive heart and chronic kidney disease with heart failure and with stage 5 chronic kidney disease, or end stage renal disease: Secondary | ICD-10-CM | POA: Diagnosis present

## 2015-11-13 DIAGNOSIS — I2510 Atherosclerotic heart disease of native coronary artery without angina pectoris: Secondary | ICD-10-CM | POA: Diagnosis not present

## 2015-11-13 DIAGNOSIS — E119 Type 2 diabetes mellitus without complications: Secondary | ICD-10-CM

## 2015-11-13 DIAGNOSIS — E11649 Type 2 diabetes mellitus with hypoglycemia without coma: Secondary | ICD-10-CM | POA: Diagnosis present

## 2015-11-13 DIAGNOSIS — E1165 Type 2 diabetes mellitus with hyperglycemia: Secondary | ICD-10-CM | POA: Diagnosis not present

## 2015-11-13 DIAGNOSIS — Z800 Family history of malignant neoplasm of digestive organs: Secondary | ICD-10-CM

## 2015-11-13 DIAGNOSIS — J45909 Unspecified asthma, uncomplicated: Secondary | ICD-10-CM | POA: Diagnosis present

## 2015-11-13 DIAGNOSIS — G8929 Other chronic pain: Secondary | ICD-10-CM | POA: Diagnosis not present

## 2015-11-13 DIAGNOSIS — R069 Unspecified abnormalities of breathing: Secondary | ICD-10-CM | POA: Diagnosis not present

## 2015-11-13 DIAGNOSIS — N170 Acute kidney failure with tubular necrosis: Secondary | ICD-10-CM | POA: Diagnosis present

## 2015-11-13 DIAGNOSIS — M79604 Pain in right leg: Secondary | ICD-10-CM | POA: Diagnosis present

## 2015-11-13 DIAGNOSIS — E875 Hyperkalemia: Secondary | ICD-10-CM | POA: Diagnosis not present

## 2015-11-13 DIAGNOSIS — I5032 Chronic diastolic (congestive) heart failure: Secondary | ICD-10-CM | POA: Diagnosis present

## 2015-11-13 DIAGNOSIS — Z7401 Bed confinement status: Secondary | ICD-10-CM

## 2015-11-13 DIAGNOSIS — N186 End stage renal disease: Secondary | ICD-10-CM | POA: Diagnosis not present

## 2015-11-13 DIAGNOSIS — M797 Fibromyalgia: Secondary | ICD-10-CM | POA: Diagnosis not present

## 2015-11-13 DIAGNOSIS — Z992 Dependence on renal dialysis: Secondary | ICD-10-CM | POA: Diagnosis not present

## 2015-11-13 DIAGNOSIS — L0390 Cellulitis, unspecified: Secondary | ICD-10-CM | POA: Diagnosis present

## 2015-11-13 DIAGNOSIS — R092 Respiratory arrest: Secondary | ICD-10-CM | POA: Diagnosis present

## 2015-11-13 DIAGNOSIS — J449 Chronic obstructive pulmonary disease, unspecified: Secondary | ICD-10-CM | POA: Diagnosis not present

## 2015-11-13 DIAGNOSIS — K920 Hematemesis: Secondary | ICD-10-CM | POA: Diagnosis not present

## 2015-11-13 DIAGNOSIS — E1122 Type 2 diabetes mellitus with diabetic chronic kidney disease: Secondary | ICD-10-CM | POA: Diagnosis not present

## 2015-11-13 DIAGNOSIS — T413X5A Adverse effect of local anesthetics, initial encounter: Secondary | ICD-10-CM | POA: Diagnosis not present

## 2015-11-13 DIAGNOSIS — E871 Hypo-osmolality and hyponatremia: Secondary | ICD-10-CM | POA: Diagnosis not present

## 2015-11-13 DIAGNOSIS — Z8249 Family history of ischemic heart disease and other diseases of the circulatory system: Secondary | ICD-10-CM | POA: Diagnosis not present

## 2015-11-13 DIAGNOSIS — E43 Unspecified severe protein-calorie malnutrition: Secondary | ICD-10-CM | POA: Insufficient documentation

## 2015-11-13 DIAGNOSIS — Z8711 Personal history of peptic ulcer disease: Secondary | ICD-10-CM | POA: Diagnosis not present

## 2015-11-13 DIAGNOSIS — R279 Unspecified lack of coordination: Secondary | ICD-10-CM | POA: Diagnosis not present

## 2015-11-13 DIAGNOSIS — D638 Anemia in other chronic diseases classified elsewhere: Secondary | ICD-10-CM | POA: Diagnosis present

## 2015-11-13 DIAGNOSIS — Z6841 Body Mass Index (BMI) 40.0 and over, adult: Secondary | ICD-10-CM

## 2015-11-13 DIAGNOSIS — J9611 Chronic respiratory failure with hypoxia: Secondary | ICD-10-CM | POA: Diagnosis not present

## 2015-11-13 DIAGNOSIS — I878 Other specified disorders of veins: Secondary | ICD-10-CM | POA: Diagnosis present

## 2015-11-13 DIAGNOSIS — E7800 Pure hypercholesterolemia, unspecified: Secondary | ICD-10-CM | POA: Diagnosis present

## 2015-11-13 DIAGNOSIS — J9622 Acute and chronic respiratory failure with hypercapnia: Secondary | ICD-10-CM | POA: Diagnosis not present

## 2015-11-13 DIAGNOSIS — N183 Chronic kidney disease, stage 3 unspecified: Secondary | ICD-10-CM

## 2015-11-13 DIAGNOSIS — L03116 Cellulitis of left lower limb: Secondary | ICD-10-CM | POA: Diagnosis not present

## 2015-11-13 DIAGNOSIS — Z9119 Patient's noncompliance with other medical treatment and regimen: Secondary | ICD-10-CM | POA: Diagnosis not present

## 2015-11-13 DIAGNOSIS — E662 Morbid (severe) obesity with alveolar hypoventilation: Secondary | ICD-10-CM | POA: Diagnosis present

## 2015-11-13 DIAGNOSIS — I890 Lymphedema, not elsewhere classified: Secondary | ICD-10-CM

## 2015-11-13 DIAGNOSIS — Z823 Family history of stroke: Secondary | ICD-10-CM | POA: Diagnosis not present

## 2015-11-13 DIAGNOSIS — D649 Anemia, unspecified: Secondary | ICD-10-CM | POA: Diagnosis not present

## 2015-11-13 DIAGNOSIS — L03119 Cellulitis of unspecified part of limb: Secondary | ICD-10-CM | POA: Diagnosis not present

## 2015-11-13 DIAGNOSIS — J9621 Acute and chronic respiratory failure with hypoxia: Secondary | ICD-10-CM | POA: Diagnosis present

## 2015-11-13 DIAGNOSIS — Z794 Long term (current) use of insulin: Secondary | ICD-10-CM | POA: Diagnosis not present

## 2015-11-13 DIAGNOSIS — L03115 Cellulitis of right lower limb: Secondary | ICD-10-CM | POA: Diagnosis present

## 2015-11-13 DIAGNOSIS — I469 Cardiac arrest, cause unspecified: Secondary | ICD-10-CM | POA: Diagnosis not present

## 2015-11-13 DIAGNOSIS — Z9981 Dependence on supplemental oxygen: Secondary | ICD-10-CM | POA: Diagnosis not present

## 2015-11-13 DIAGNOSIS — N179 Acute kidney failure, unspecified: Secondary | ICD-10-CM | POA: Diagnosis not present

## 2015-11-13 DIAGNOSIS — I10 Essential (primary) hypertension: Secondary | ICD-10-CM | POA: Diagnosis not present

## 2015-11-13 DIAGNOSIS — M7989 Other specified soft tissue disorders: Secondary | ICD-10-CM | POA: Diagnosis not present

## 2015-11-13 HISTORY — DX: Chronic obstructive pulmonary disease, unspecified: J44.9

## 2015-11-13 LAB — CBC WITH DIFFERENTIAL/PLATELET
BASOS ABS: 0 10*3/uL (ref 0.0–0.1)
Basophils Relative: 0 %
Eosinophils Absolute: 0.1 10*3/uL (ref 0.0–0.7)
Eosinophils Relative: 2 %
HEMATOCRIT: 33.5 % — AB (ref 36.0–46.0)
HEMOGLOBIN: 10.6 g/dL — AB (ref 12.0–15.0)
LYMPHS PCT: 18 %
Lymphs Abs: 1.3 10*3/uL (ref 0.7–4.0)
MCH: 28.1 pg (ref 26.0–34.0)
MCHC: 31.6 g/dL (ref 30.0–36.0)
MCV: 88.9 fL (ref 78.0–100.0)
MONO ABS: 0.5 10*3/uL (ref 0.1–1.0)
Monocytes Relative: 7 %
NEUTROS ABS: 5.2 10*3/uL (ref 1.7–7.7)
NEUTROS PCT: 73 %
Platelets: 235 10*3/uL (ref 150–400)
RBC: 3.77 MIL/uL — ABNORMAL LOW (ref 3.87–5.11)
RDW: 16.5 % — ABNORMAL HIGH (ref 11.5–15.5)
WBC: 7.2 10*3/uL (ref 4.0–10.5)

## 2015-11-13 LAB — COMPREHENSIVE METABOLIC PANEL
ALK PHOS: 134 U/L — AB (ref 38–126)
ALT: 10 U/L — AB (ref 14–54)
AST: 13 U/L — ABNORMAL LOW (ref 15–41)
Albumin: 2.8 g/dL — ABNORMAL LOW (ref 3.5–5.0)
Anion gap: 6 (ref 5–15)
BUN: 31 mg/dL — AB (ref 6–20)
CO2: 31 mmol/L (ref 22–32)
CREATININE: 1.62 mg/dL — AB (ref 0.44–1.00)
Calcium: 8.8 mg/dL — ABNORMAL LOW (ref 8.9–10.3)
Chloride: 102 mmol/L (ref 101–111)
GFR calc Af Amer: 42 mL/min — ABNORMAL LOW (ref >60)
GFR, EST NON AFRICAN AMERICAN: 36 mL/min — AB (ref >60)
Glucose, Bld: 195 mg/dL — ABNORMAL HIGH (ref 65–99)
Potassium: 5.3 mmol/L — ABNORMAL HIGH (ref 3.5–5.1)
Sodium: 139 mmol/L (ref 135–145)
TOTAL PROTEIN: 7.3 g/dL (ref 6.5–8.1)
Total Bilirubin: 0.8 mg/dL (ref 0.3–1.2)

## 2015-11-13 LAB — BRAIN NATRIURETIC PEPTIDE: B NATRIURETIC PEPTIDE 5: 315 pg/mL — AB (ref 0.0–100.0)

## 2015-11-13 LAB — I-STAT CG4 LACTIC ACID, ED: Lactic Acid, Venous: 0.99 mmol/L (ref 0.5–2.0)

## 2015-11-13 MED ORDER — NYSTATIN 100000 UNIT/GM EX POWD
CUTANEOUS | Status: AC
  Filled 2015-11-13: qty 15

## 2015-11-13 MED ORDER — OXYCODONE HCL 10 MG PO TABS: 10.0000 mg | ORAL_TABLET | Freq: Four times a day (QID) | ORAL | Status: DC | PRN

## 2015-11-13 MED ORDER — METOPROLOL TARTRATE 50 MG PO TABS
50.0000 mg | ORAL_TABLET | Freq: Two times a day (BID) | ORAL | Status: DC
  Administered 2015-11-13 – 2015-11-27 (×24): 50 mg via ORAL
  Filled 2015-11-13 (×29): qty 1

## 2015-11-13 MED ORDER — ONDANSETRON HCL 4 MG/2ML IJ SOLN
4.0000 mg | Freq: Four times a day (QID) | INTRAMUSCULAR | Status: DC | PRN
  Administered 2015-11-14 – 2015-11-22 (×16): 4 mg via INTRAVENOUS
  Filled 2015-11-13 (×26): qty 2

## 2015-11-13 MED ORDER — CLINDAMYCIN PHOSPHATE 600 MG/50ML IV SOLN
600.0000 mg | Freq: Four times a day (QID) | INTRAVENOUS | Status: DC
  Administered 2015-11-13 – 2015-11-24 (×41): 600 mg via INTRAVENOUS
  Filled 2015-11-13 (×44): qty 50

## 2015-11-13 MED ORDER — ENOXAPARIN SODIUM 40 MG/0.4ML ~~LOC~~ SOLN
40.0000 mg | SUBCUTANEOUS | Status: DC
  Administered 2015-11-13: 40 mg via SUBCUTANEOUS
  Filled 2015-11-13: qty 0.4

## 2015-11-13 MED ORDER — MORPHINE SULFATE (PF) 4 MG/ML IV SOLN
4.0000 mg | Freq: Once | INTRAVENOUS | Status: AC
  Administered 2015-11-13: 4 mg via INTRAVENOUS
  Filled 2015-11-13: qty 1

## 2015-11-13 MED ORDER — ACETAMINOPHEN 650 MG RE SUPP
650.0000 mg | Freq: Four times a day (QID) | RECTAL | Status: DC | PRN
Start: 2015-11-13 — End: 2015-11-28

## 2015-11-13 MED ORDER — DIPHENHYDRAMINE HCL 25 MG PO CAPS
25.0000 mg | ORAL_CAPSULE | Freq: Four times a day (QID) | ORAL | Status: DC | PRN
  Administered 2015-11-27: 25 mg via ORAL
  Filled 2015-11-13: qty 1

## 2015-11-13 MED ORDER — SODIUM CHLORIDE 0.9 % IV SOLN: 250.0000 mL | INTRAVENOUS | Status: DC | PRN

## 2015-11-13 MED ORDER — SODIUM CHLORIDE 0.9 % IJ SOLN
3.0000 mL | INTRAMUSCULAR | Status: DC | PRN
  Administered 2015-11-25: 3 mL via INTRAVENOUS
  Filled 2015-11-13: qty 3

## 2015-11-13 MED ORDER — SODIUM CHLORIDE 0.9 % IJ SOLN
3.0000 mL | Freq: Two times a day (BID) | INTRAMUSCULAR | Status: DC
  Administered 2015-11-14 – 2015-11-27 (×8): 3 mL via INTRAVENOUS
  Administered 2015-11-28: 22:00:00 via INTRAVENOUS
  Administered 2015-11-29 – 2015-12-05 (×7): 3 mL via INTRAVENOUS

## 2015-11-13 MED ORDER — INSULIN ASPART PROT & ASPART (70-30 MIX) 100 UNIT/ML ~~LOC~~ SUSP
60.0000 [IU] | Freq: Two times a day (BID) | SUBCUTANEOUS | Status: DC
  Administered 2015-11-14 (×2): 60 [IU] via SUBCUTANEOUS
  Filled 2015-11-13: qty 10

## 2015-11-13 MED ORDER — POTASSIUM CHLORIDE CRYS ER 20 MEQ PO TBCR: 20.0000 meq | EXTENDED_RELEASE_TABLET | Freq: Three times a day (TID) | ORAL | Status: DC

## 2015-11-13 MED ORDER — ACETAMINOPHEN 325 MG PO TABS
650.0000 mg | ORAL_TABLET | ORAL | Status: DC | PRN
  Administered 2015-11-18 – 2015-11-25 (×2): 650 mg via ORAL
  Filled 2015-11-13 (×4): qty 2

## 2015-11-13 MED ORDER — SODIUM CHLORIDE 0.9 % IJ SOLN
3.0000 mL | Freq: Two times a day (BID) | INTRAMUSCULAR | Status: DC
  Administered 2015-11-13 – 2015-11-27 (×16): 3 mL via INTRAVENOUS

## 2015-11-13 MED ORDER — OXYCODONE HCL 5 MG PO TABS
10.0000 mg | ORAL_TABLET | Freq: Four times a day (QID) | ORAL | Status: DC | PRN
  Administered 2015-11-13 – 2015-11-27 (×15): 10 mg via ORAL
  Filled 2015-11-13 (×19): qty 2

## 2015-11-13 MED ORDER — FUROSEMIDE 10 MG/ML IJ SOLN
40.0000 mg | Freq: Two times a day (BID) | INTRAMUSCULAR | Status: AC
  Administered 2015-11-13 – 2015-11-14 (×2): 40 mg via INTRAVENOUS
  Filled 2015-11-13 (×2): qty 4

## 2015-11-13 MED ORDER — ALPRAZOLAM 0.5 MG PO TABS
0.5000 mg | ORAL_TABLET | Freq: Three times a day (TID) | ORAL | Status: DC | PRN
  Administered 2015-11-14 – 2015-11-27 (×6): 0.5 mg via ORAL
  Filled 2015-11-13 (×8): qty 1

## 2015-11-13 MED ORDER — CLINDAMYCIN PHOSPHATE 600 MG/50ML IV SOLN
600.0000 mg | Freq: Once | INTRAVENOUS | Status: AC
  Administered 2015-11-13: 600 mg via INTRAVENOUS
  Filled 2015-11-13: qty 50

## 2015-11-13 MED ORDER — ONDANSETRON HCL 4 MG/2ML IJ SOLN
4.0000 mg | Freq: Once | INTRAMUSCULAR | Status: AC
  Administered 2015-11-13: 4 mg via INTRAVENOUS
  Filled 2015-11-13: qty 2

## 2015-11-13 MED ORDER — NYSTATIN 100000 UNIT/GM EX POWD
Freq: Two times a day (BID) | CUTANEOUS | Status: DC
  Administered 2015-11-13 – 2015-12-04 (×34): via TOPICAL
  Administered 2015-12-05: 1 via TOPICAL
  Filled 2015-11-13 (×5): qty 15

## 2015-11-13 MED ORDER — CLINDAMYCIN PHOSPHATE 600 MG/50ML IV SOLN
INTRAVENOUS | Status: AC
  Filled 2015-11-13: qty 100

## 2015-11-13 MED ORDER — ACETAMINOPHEN 325 MG PO TABS
650.0000 mg | ORAL_TABLET | Freq: Four times a day (QID) | ORAL | Status: DC | PRN
  Administered 2015-11-15: 650 mg via ORAL

## 2015-11-13 NOTE — ED Notes (Signed)
Hospitalist at bedside.

## 2015-11-13 NOTE — ED Notes (Signed)
Attempt made to give report.

## 2015-11-13 NOTE — ED Provider Notes (Signed)
CSN: [id number]     Arrival date & time 11/13/15  1420 History   First MD Initiated Contact with Patient 11/13/15 1455     Chief Complaint  Patient presents with  . Leg Pain     (Consider location/radiation/quality/duration/timing/severity/associated sxs/prior Treatment) HPI Comments: Patient with one week history of worsening bilateral lower extremity edema weeping and odor for the past 3 days. Right side is worse than the left. Denies any falls or injury. Denies fever. States that order is malodorous and is similar to when she had cellulitis several years ago. Has a history of lymphedema, COPD on home oxygen she takes torsemide twice daily and states compliance. Denies any history of heart failure. Describes intermittent chest pain over the past several days as well in the left side of her chest lasting for 15-20 seconds at a time it does not radiate. Denies any nausea, dizziness or lightheadedness. She's had a poor appetite. Normally ambulates with a walker or cane but has not been able to do so for the past several days.Harvie Bridge   The history is provided by the patient.    Past Medical History  Diagnosis Date  . Diabetes mellitus   . Chronic pain   . Lymph edema   . Cellulitis   . Chiari malformation     s/p surgery  . S/P colonoscopy 05/26/2007    Dr. Laural Golden sigmoid diverticulosis random biopsies benign  . CAD (coronary artery disease)   . Hypercholesterolemia   . Hypertension   . COPD (chronic obstructive pulmonary disease) ([place])   . S/P endoscopy 05/01/2009    Dr. Penelope Coop pill-induced esophageal ulcerations distal to midesophagus, 2 small ulcers in the antrum of the stomach  . Fibromyalgia   . On home O2   . Obesity hypoventilation syndrome ([place])   . Hx of echocardiogram 10/2011    EF 55-60%   Past Surgical History  Procedure Laterality Date  . Cesarean section       x 2  . Abdominal hysterectomy    . Tonsillectomy    . Adenoidectomy    . Thyroidectomy, partial     . Craniectomy suboccipital w/ cervical laminectomy / chiari    . Portacath placement  07/05/2012    Procedure: INSERTION PORT-A-CATH;  Surgeon: Donato Heinz, MD;  Location: [facility];  Service: General;  Laterality: Left;  subclavian   Family History  Problem Relation Age of Onset  . Colon cancer Mother 92  . Stroke Mother 34  . Coronary artery disease Mother    Social History  Substance Use Topics  . Smoking status: Never Smoker   . Smokeless tobacco: Never Used  . Alcohol Use: No   OB History    No data available     Review of Systems  Constitutional: Positive for activity change and appetite change. Negative for fever.  HENT: Negative for congestion.   Eyes: Negative for visual disturbance.  Respiratory: Positive for shortness of breath. Negative for cough and chest tightness.   Cardiovascular: Positive for chest pain and leg swelling.  Gastrointestinal: Negative for nausea, vomiting and abdominal pain.  Genitourinary: Negative for dysuria, hematuria, vaginal bleeding and vaginal discharge.  Musculoskeletal: Positive for myalgias and arthralgias.  Skin: Negative for rash.  Neurological: Positive for weakness. Negative for dizziness and headaches.    A complete 10 system review of systems was obtained and all systems are negative except as noted in the HPI and PMH.    Allergies  Vancomycin; Contrast media; Pineapple; Pneumococcal  vaccines; and Shellfish allergy  Home Medications   Prior to Admission medications   Medication Sig Start Date End Date Taking? Authorizing Provider  ALPRAZolam Duanne Moron) 0.5 MG tablet Take 0.5 mg by mouth 3 (three) times daily as needed for anxiety or sleep.    Yes Historical Provider, MD  amLODipine (NORVASC) 10 MG tablet Take 10 mg by mouth daily.   Yes Historical Provider, MD  insulin NPH-insulin regular (NOVOLIN 70/30) (70-30) 100 UNIT/ML injection Inject 60 Units into the skin 2 (two) times daily.   Yes Historical Provider, MD  metoprolol  (LOPRESSOR) 50 MG tablet Take 50 mg by mouth 2 (two) times daily.   Yes Historical Provider, MD  Oxycodone HCl 10 MG TABS Take 10 mg by mouth 4 (four) times daily as needed (pain).    Yes Historical Provider, MD  potassium chloride SA (K-DUR,KLOR-CON) 20 MEQ tablet Take 20 mEq by mouth 3 (three) times daily.   Yes Historical Provider, MD  torsemide (DEMADEX) 100 MG tablet Take 100 mg by mouth 2 (two) times daily.    Yes Historical Provider, MD  acetaminophen (TYLENOL) 325 MG tablet Take 650 mg by mouth every 6 (six) hours as needed. fever    Historical Provider, MD  diphenhydrAMINE (BENADRYL) 25 mg capsule Take 25 mg by mouth every 6 (six) hours as needed for allergies.    Historical Provider, MD  docusate sodium (COLACE) 100 MG capsule Take 100 mg by mouth 2 (two) times daily as needed. For constipation    Historical Provider, MD  HYDROcodone-acetaminophen (NORCO/VICODIN) 5-325 MG per tablet Take 2 tablets by mouth every 4 (four) hours as needed. Patient not taking: Reported on 12/02/2014 11/06/14   Noemi Chapel, MD  oseltamivir (TAMIFLU) 75 MG capsule Take 1 capsule (75 mg total) by mouth every 12 (twelve) hours. Patient not taking: Reported on 12/02/2014 11/06/14   Noemi Chapel, MD  oxyCODONE-acetaminophen (PERCOCET/ROXICET) 5-325 MG per tablet Take 1 tablet by mouth every 6 (six) hours as needed for severe pain. 09/11/15   Merryl Hacker, MD   BP 156/81 mmHg  Pulse 74  Temp(Src) 97.9 F (36.6 C) (Oral)  Resp 18  SpO2 99% Physical Exam  Constitutional: She is oriented to person, place, and time. She appears well-developed and well-nourished. No distress.  HENT:  Head: Normocephalic and atraumatic.  Mouth/Throat: Oropharynx is clear and moist. No oropharyngeal exudate.  Eyes: Conjunctivae and EOM are normal. Pupils are equal, round, and reactive to light.  Neck: Normal range of motion. Neck supple.  No meningismus.  Cardiovascular: Normal rate, regular rhythm, normal heart sounds and  intact distal pulses.   No murmur heard. Pulmonary/Chest: Effort normal and breath sounds normal. No respiratory distress.  Abdominal: Soft. There is no tenderness. There is no rebound and no guarding.  Musculoskeletal: Normal range of motion. She exhibits edema and tenderness.  Massive edema and lymphedema bilateral lower extremities. There is areas of hyperkeratosis and hyperpigmentation to the bilateral lower extremities. There is foul-smelling clear discharge from bilateral lower legs. Unable to palpate DP or PT pulses.  Neurological: She is alert and oriented to person, place, and time. No cranial nerve deficit. She exhibits normal muscle tone. Coordination normal.  No ataxia on finger to nose bilaterally. No pronator drift. 5/5 strength throughout. CN 2-12 intact.Equal grip strength. Sensation intact.   Skin: Skin is warm.  Psychiatric: She has a normal mood and affect. Her behavior is normal.  Nursing note and vitals reviewed.   ED Course  Procedures (including  critical care time) Labs Review Labs Reviewed  CBC WITH DIFFERENTIAL/PLATELET - Abnormal; Notable for the following:    RBC 3.77 (*)    Hemoglobin 10.6 (*)    HCT 33.5 (*)    RDW 16.5 (*)    All other components within normal limits  COMPREHENSIVE METABOLIC PANEL - Abnormal; Notable for the following:    Potassium 5.3 (*)    Glucose, Bld 195 (*)    BUN 31 (*)    Creatinine, Ser 1.62 (*)    Calcium 8.8 (*)    Albumin 2.8 (*)    AST 13 (*)    ALT 10 (*)    Alkaline Phosphatase 134 (*)    GFR calc non Af Amer 36 (*)    GFR calc Af Amer 42 (*)    All other components within normal limits  BRAIN NATRIURETIC PEPTIDE - Abnormal; Notable for the following:    B Natriuretic Peptide 315.0 (*)    All other components within normal limits  CULTURE, BLOOD (ROUTINE X 2)  CULTURE, BLOOD (ROUTINE X 2)  URINALYSIS, ROUTINE W REFLEX MICROSCOPIC (NOT AT [facility])  I-STAT CG4 LACTIC ACID, ED  I-STAT CG4 LACTIC ACID, ED    Imaging  Review Dg Chest Portable 1 View  11/13/2015  CLINICAL DATA:  Shortness of Breath EXAM: PORTABLE CHEST 1 VIEW COMPARISON:  December 02, 2014 FINDINGS: Port-A-Cath tip is in the superior vena cava. No pneumothorax. There is cardiomegaly with pulmonary venous hypertension. There is no frank edema or consolidation. No adenopathy. IMPRESSION: Pulmonary vascular congestion.  No frank edema or consolidation. Electronically Signed   By: Lowella Grip III M.D.   On: 11/13/2015 15:45   I have personally reviewed and evaluated these images and lab results as part of my medical decision-making.   EKG Interpretation   Date/Time:  Thursday November 13 2015 16:06:50 EST Ventricular Rate:  77 PR Interval:  141 QRS Duration: 77 QT Interval:  395 QTC Calculation: 447 R Axis:   63 Text Interpretation:  Sinus rhythm Borderline low voltage, extremity leads  Baseline wander in lead(s) V1 No significant change was found Confirmed by  Wyvonnia Dusky  MD, Carlosdaniel Grob [phone]) on 11/13/2015 4:12:46 PM      MDM   Final diagnoses:  Cellulitis of lower extremity, unspecified laterality   Massive lymphedema in legs swelling with foul-smelling discharge and probable cellulitis.  No leukocytosis, afebrile.  Creatinine is at baseline.  BNP 315 with mild congestion on Xray. No increased work of breathing.  Difficult exam but will treat for probable cellulitis with leg swelling and odor.  Patient started on IV clindamycin. Admission for cellulitis discussed with Dr. Sarajane Jews.  Ezequiel [place], MD 11/13/15 Carollee Massed

## 2015-11-13 NOTE — ED Notes (Signed)
Pt reports pain in bilateral lower extremities x 1 week, weeping edema and odor started 3 days ago.

## 2015-11-13 NOTE — ED Notes (Signed)
MD at bedside.

## 2015-11-13 NOTE — ED Notes (Signed)
Lab at bedside

## 2015-11-13 NOTE — H&P (Addendum)
History and Physical  [name] AES:[id number] DOB: 1966/05/17 DOA: 11/13/2015  Referring physician: Dr. Wyvonnia Dusky in ED PCP: Alonza Bogus, MD   Chief Complaint: leg swelling and pain  HPI:  8 yow PMH BLE lymphedema, grade 2 diastolic dysfunction, morbid obesity presented to ED with h/o progressive BLE edema, pain and weeping. Initial evaluation was concerning for early cellulitis.  History obtained from patient and husband at bedside. H/o LE cellulitis in past.   Last 2 weeks has had increasing BLE, torsemide now ineffective. Legs heavier and now difficult to ambulate and even lift legs onto bed. Oxycodone ineffective at pain relief. Increased weeping BLE, R<L and pain R<L. Pain both legs entirely. No SOB.    In the emergency department afebrile, VSS, stable O2 requirement. Treated with morphine and clindamycin. Pertinent labs: potassium 5.3, creatinine 1.62 (close to baseline), Hgb stable 10.6, normal WBC EKG: Independently reviewed. SR, no acute changes Imaging: independently reviewed CXR, no acute disease  Review of Systems:  Reports subjective fever at night for a few nights; some blurred vision lately; intermittent transient chest pain over last 2 days; nausea; rash under breasts  Negative for sore throat,  SOB, dysuria, bleeding, v/abdominal pain.  Past Medical History  Diagnosis Date  . Diabetes mellitus   . Chronic pain   . Lymph edema   . Cellulitis   . Chiari malformation     s/p surgery  . S/P colonoscopy 05/26/2007    Dr. Laural Golden sigmoid diverticulosis random biopsies benign  . CAD (coronary artery disease)   . Hypercholesterolemia   . Hypertension   . Asthma   . S/P endoscopy 05/01/2009    Dr. Penelope Coop pill-induced esophageal ulcerations distal to midesophagus, 2 small ulcers in the antrum of the stomach  . Fibromyalgia   . On home O2   . Obesity hypoventilation syndrome ([place])   . Hx of echocardiogram 10/2011    EF 55-60%    Past Surgical History    Procedure Laterality Date  . Cesarean section       x 2  . Abdominal hysterectomy    . Tonsillectomy    . Adenoidectomy    . Thyroidectomy, partial    . Craniectomy suboccipital w/ cervical laminectomy / chiari    . Portacath placement  07/05/2012    Procedure: INSERTION PORT-A-CATH;  Surgeon: Donato Heinz, MD;  Location: [facility];  Service: General;  Laterality: Left;  subclavian    Social History:  reports that she has never smoked. She has never used smokeless tobacco. She reports that she does not drink alcohol or use illicit drugs. lives with their spouse Partial assistance  Allergies  Allergen Reactions  . Vancomycin Nausea And Vomiting    Infusion "made me feel like I was dying" had to be readmitted to hospital  . Contrast Media [Iodinated Diagnostic Agents] Hives and Swelling    Dye for cardiac cath. Tongue swells  . Pineapple Itching and Swelling  . Pneumococcal Vaccines Swelling    Turns skin black, and swelling  . Shellfish Allergy Itching and Swelling    Family History  Problem Relation Age of Onset  . Colon cancer Mother 22  . Stroke Mother 28  . Coronary artery disease Mother      Prior to Admission medications   Medication Sig Start Date End Date Taking? Authorizing Provider  ALPRAZolam Duanne Moron) 0.5 MG tablet Take 0.5 mg by mouth 3 (three) times daily as needed for anxiety or sleep.    Yes Historical Provider,  MD  amLODipine (NORVASC) 10 MG tablet Take 10 mg by mouth daily.   Yes Historical Provider, MD  insulin NPH-insulin regular (NOVOLIN 70/30) (70-30) 100 UNIT/ML injection Inject 60 Units into the skin 2 (two) times daily.   Yes Historical Provider, MD  metoprolol (LOPRESSOR) 50 MG tablet Take 50 mg by mouth 2 (two) times daily.   Yes Historical Provider, MD  Oxycodone HCl 10 MG TABS Take 10 mg by mouth 4 (four) times daily as needed (pain).    Yes Historical Provider, MD  potassium chloride SA (K-DUR,KLOR-CON) 20 MEQ tablet Take 20 mEq by mouth 3  (three) times daily.   Yes Historical Provider, MD  torsemide (DEMADEX) 100 MG tablet Take 100 mg by mouth 2 (two) times daily.    Yes Historical Provider, MD  acetaminophen (TYLENOL) 325 MG tablet Take 650 mg by mouth every 6 (six) hours as needed. fever    Historical Provider, MD  diphenhydrAMINE (BENADRYL) 25 mg capsule Take 25 mg by mouth every 6 (six) hours as needed for allergies.    Historical Provider, MD  docusate sodium (COLACE) 100 MG capsule Take 100 mg by mouth 2 (two) times daily as needed. For constipation    Historical Provider, MD  HYDROcodone-acetaminophen (NORCO/VICODIN) 5-325 MG per tablet Take 2 tablets by mouth every 4 (four) hours as needed. Patient not taking: Reported on 12/02/2014 11/06/14   Noemi Chapel, MD  oseltamivir (TAMIFLU) 75 MG capsule Take 1 capsule (75 mg total) by mouth every 12 (twelve) hours. Patient not taking: Reported on 12/02/2014 11/06/14   Noemi Chapel, MD  oxyCODONE-acetaminophen (PERCOCET/ROXICET) 5-325 MG per tablet Take 1 tablet by mouth every 6 (six) hours as needed for severe pain. 09/11/15   Merryl Hacker, MD   Physical Exam: Filed Vitals:   11/13/15 1427 11/13/15 1500  BP: 159/84 166/82  Pulse: 73 66  Temp: 97.9 F (36.6 C)   TempSrc: Oral   Resp: 22 24  SpO2: 96% 99%    General: Examined in Ed. Appears calm, mildly uncomfortable Eyes: PERRL, normal lids, irises  ENT: grossly normal hearing, lips & tongue Neck: appears grossly normal Cardiovascular: RRR, no m/r/g. Massive lymphedema BLE. Respiratory: CTA bilaterally, no w/r/r. Normal respiratory effort. Abdomen: soft, ntnd; obese Skin: hyperkeratotic skin lower legs bilaterally with crevasses; some bullae RLE > LLE. No open wounds noted. Has difficulty lifting legs. Pulses in foot not accessible secondary to edema. Bilateral soles of feet pink and have normal capillary refill Musculoskeletal: grossly normal tone BUE/BLE Psychiatric: grossly normal mood and affect, speech fluent  and appropriate Neurologic: grossly non-focal.  Wt Readings from Last 3 Encounters:  09/11/15 158.759 kg (350 lb)  12/02/14 158.759 kg (350 lb)  11/06/14 154.223 kg (340 lb)    Labs on Admission:  Basic Metabolic Panel:  Recent Labs Lab 11/13/15 1500  NA 139  K 5.3*  CL 102  CO2 31  GLUCOSE 195*  BUN 31*  CREATININE 1.62*  CALCIUM 8.8*    Liver Function Tests:  Recent Labs Lab 11/13/15 1500  AST 13*  ALT 10*  ALKPHOS 134*  BILITOT 0.8  PROT 7.3  ALBUMIN 2.8*    CBC:  Recent Labs Lab 11/13/15 1500  WBC 7.2  NEUTROABS 5.2  HGB 10.6*  HCT 33.5*  MCV 88.9  PLT 235      Radiological Exams on Admission: Dg Chest Portable 1 View  11/13/2015  CLINICAL DATA:  Shortness of Breath EXAM: PORTABLE CHEST 1 VIEW COMPARISON:  December 02, 2014  FINDINGS: Port-A-Cath tip is in the superior vena cava. No pneumothorax. There is cardiomegaly with pulmonary venous hypertension. There is no frank edema or consolidation. No adenopathy. IMPRESSION: Pulmonary vascular congestion.  No frank edema or consolidation. Electronically Signed   By: Lowella Grip III M.D.   On: 11/13/2015 15:45      Principal Problem:   Cellulitis of leg without foot, right Active Problems:   Lymphedema of lower extremity   Diabetes mellitus (HCC)   Morbid obesity (HCC)   Hyperkalemia   Chronic diastolic CHF (congestive heart failure) (HCC)   Chronic respiratory failure with hypoxia (HCC)   CKD (chronic kidney disease), stage III   Anemia of chronic disease   Assessment/Plan 1. Suspected early RLE cellulitis; normal lactate. No evidence to suggest sepsis. 2. Acute on chronic bilateral LE lymphedema 3. Possible acute on chronic diastolic CHF (LVEF 99-77%, grade 2 diastolic dysfunction 41/4239) 4. Bilateral LE weakness secondary to edema 5. Chronic hypoxic respiratory failure 6. CKD stage III, appears stable; modest hyperkalemia (except resolution with diuresis). 7. DM on insulin, AG 6,  random glucose 195 8. Anemia of chronic disease 9. Chronic pain   Admit to medical bed  Empiric abx. Given vancomycin allergy, limitations of exam, will cover Strep/Staph with Clindamycin.  IV diuresis, insert foley for aggressive diuresis, skin care. Mobility very limited at this point.  Nystatin powder to skin  Code Status: Full code  DVT prophylaxis: Lovenox Family Communication: discussed in detail with husband at bedside Disposition Plan/Anticipated LOS: admit to Dr. [name], 2-3 days  Time spent: 60 minutes  Murray Hodgkins, MD  [facility] Pager [phone] 11/13/2015, 4:52 PM

## 2015-11-14 LAB — BASIC METABOLIC PANEL
Anion gap: 4 — ABNORMAL LOW (ref 5–15)
Anion gap: 4 — ABNORMAL LOW (ref 5–15)
BUN: 34 mg/dL — ABNORMAL HIGH (ref 6–20)
BUN: 35 mg/dL — ABNORMAL HIGH (ref 6–20)
CALCIUM: 8.4 mg/dL — AB (ref 8.9–10.3)
CALCIUM: 8.6 mg/dL — AB (ref 8.9–10.3)
CHLORIDE: 101 mmol/L (ref 101–111)
CHLORIDE: 101 mmol/L (ref 101–111)
CO2: 32 mmol/L (ref 22–32)
CO2: 32 mmol/L (ref 22–32)
CREATININE: 1.98 mg/dL — AB (ref 0.44–1.00)
CREATININE: 2.3 mg/dL — AB (ref 0.44–1.00)
GFR calc Af Amer: 33 mL/min — ABNORMAL LOW (ref >60)
GFR calc non Af Amer: 28 mL/min — ABNORMAL LOW (ref >60)
GFR calc non Af Amer: 24 mL/min — ABNORMAL LOW (ref >60)
GFR, EST AFRICAN AMERICAN: 28 mL/min — AB (ref >60)
GLUCOSE: 220 mg/dL — AB (ref 65–99)
GLUCOSE: 201 mg/dL — AB (ref 65–99)
POTASSIUM: 6.5 mmol/L — AB (ref 3.5–5.1)
Potassium: 6.7 mmol/L (ref 3.5–5.1)
Sodium: 137 mmol/L (ref 135–145)
Sodium: 137 mmol/L (ref 135–145)

## 2015-11-14 LAB — GLUCOSE, CAPILLARY
GLUCOSE-CAPILLARY: 92 mg/dL (ref 65–99)
GLUCOSE-CAPILLARY: 60 mg/dL — AB (ref 65–99)
GLUCOSE-CAPILLARY: 81 mg/dL (ref 65–99)
Glucose-Capillary: 178 mg/dL — ABNORMAL HIGH (ref 65–99)
Glucose-Capillary: 193 mg/dL — ABNORMAL HIGH (ref 65–99)

## 2015-11-14 MED ORDER — SODIUM POLYSTYRENE SULFONATE 15 GM/60ML PO SUSP
15.0000 g | Freq: Once | ORAL | Status: AC
  Administered 2015-11-14: 15 g via ORAL
  Filled 2015-11-14: qty 60

## 2015-11-14 MED ORDER — DEXTROSE 50 % IV SOLN
INTRAVENOUS | Status: AC
  Administered 2015-11-14: 50 mL via INTRAVENOUS
  Filled 2015-11-14: qty 50

## 2015-11-14 MED ORDER — ENOXAPARIN SODIUM 100 MG/ML ~~LOC~~ SOLN
100.0000 mg | SUBCUTANEOUS | Status: DC
  Administered 2015-11-14 – 2015-11-17 (×4): 100 mg via SUBCUTANEOUS
  Filled 2015-11-14 (×4): qty 1

## 2015-11-14 MED ORDER — DEXTROSE 50 % IV SOLN
50.0000 mL | Freq: Once | INTRAVENOUS | Status: AC
  Administered 2015-11-14: 50 mL via INTRAVENOUS

## 2015-11-14 NOTE — Progress Notes (Signed)
CRITICAL VALUE ALERT  Critical value received:  K+ 6.5  Date of notification:  11/14/15  Time of notification:  0645  Critical value read back:Yes.    Nurse who received alert:  Stann Mainland  MD notified (1st page):  Dr Marily Memos  Time of first page:  0650  MD notified (2nd page):  Time of second page:  Responding MD:  Dr Marily Memos  Time MD responded: [phone]

## 2015-11-14 NOTE — Progress Notes (Signed)
Subjective: Patient was admitted due to cellulitis of the lower extremities. Patient is morbidly obese and chronic venous stasis of the lower extremities.  Objective: Vital signs in last 24 hours: Temp:  [97.6 F (36.4 C)-98.6 F (37 C)] 97.6 F (36.4 C) (11/25 0454) Pulse Rate:  [54-75] 54 (11/25 0454) Resp:  [18-24] 20 (11/25 0454) BP: (100-166)/(60-84) 100/60 mmHg (11/25 0454) SpO2:  [94 %-99 %] 94 % (11/25 0454) FiO2 (%):  [0 %] 0 % (11/24 1931) Weight:  [207.747 kg (458 lb)-212.283 kg (468 lb)] 207.747 kg (458 lb) (11/25 4103) Weight change:     Intake/Output from previous day: 11/24 0701 - 11/25 0700 In: 240 [P.O.:240] Out: -   PHYSICAL EXAM General appearance: alert and no distress Resp: clear to auscultation bilaterally Cardio: S1, S2 normal GI: soft, non-tender; bowel sounds normal; no masses,  no organomegaly Extremities: venous stasis dermatitis noted and elephanthasis  Lab Results:  Results for orders placed or performed during the hospital encounter of 11/13/15 (from the past 48 hour(s))  CBC with Differential/Platelet     Status: Abnormal   Collection Time: 11/13/15  3:00 PM  Result Value Ref Range   WBC 7.2 4.0 - 10.5 K/uL   RBC 3.77 (L) 3.87 - 5.11 MIL/uL   Hemoglobin 10.6 (L) 12.0 - 15.0 g/dL   HCT 33.5 (L) 36.0 - 46.0 %   MCV 88.9 78.0 - 100.0 fL   MCH 28.1 26.0 - 34.0 pg   MCHC 31.6 30.0 - 36.0 g/dL   RDW 16.5 (H) 11.5 - 15.5 %   Platelets 235 150 - 400 K/uL   Neutrophils Relative % 73 %   Neutro Abs 5.2 1.7 - 7.7 K/uL   Lymphocytes Relative 18 %   Lymphs Abs 1.3 0.7 - 4.0 K/uL   Monocytes Relative 7 %   Monocytes Absolute 0.5 0.1 - 1.0 K/uL   Eosinophils Relative 2 %   Eosinophils Absolute 0.1 0.0 - 0.7 K/uL   Basophils Relative 0 %   Basophils Absolute 0.0 0.0 - 0.1 K/uL  Comprehensive metabolic panel     Status: Abnormal   Collection Time: 11/13/15  3:00 PM  Result Value Ref Range   Sodium 139 135 - 145 mmol/L   Potassium 5.3 (H) 3.5 -  5.1 mmol/L   Chloride 102 101 - 111 mmol/L   CO2 31 22 - 32 mmol/L   Glucose, Bld 195 (H) 65 - 99 mg/dL   BUN 31 (H) 6 - 20 mg/dL   Creatinine, Ser 1.62 (H) 0.44 - 1.00 mg/dL   Calcium 8.8 (L) 8.9 - 10.3 mg/dL   Total Protein 7.3 6.5 - 8.1 g/dL   Albumin 2.8 (L) 3.5 - 5.0 g/dL   AST 13 (L) 15 - 41 U/L   ALT 10 (L) 14 - 54 U/L   Alkaline Phosphatase 134 (H) 38 - 126 U/L   Total Bilirubin 0.8 0.3 - 1.2 mg/dL   GFR calc non Af Amer 36 (L) >60 mL/min   GFR calc Af Amer 42 (L) >60 mL/min    Comment: (NOTE) The eGFR has been calculated using the CKD EPI equation. This calculation has not been validated in all clinical situations. eGFR's persistently <60 mL/min signify possible Chronic Kidney Disease.    Anion gap 6 5 - 15  Brain natriuretic peptide     Status: Abnormal   Collection Time: 11/13/15  3:00 PM  Result Value Ref Range   B Natriuretic Peptide 315.0 (H) 0.0 - 100.0 pg/mL  Blood culture (routine x 2)     Status: None (Preliminary result)   Collection Time: 11/13/15  3:31 PM  Result Value Ref Range   Specimen Description BLOOD LEFT ARM    Special Requests      BOTTLES DRAWN AEROBIC AND ANAEROBIC AEB 10CC ANA6CC   Culture NO GROWTH < 24 HOURS    Report Status PENDING   Blood culture (routine x 2)     Status: None (Preliminary result)   Collection Time: 11/13/15  3:37 PM  Result Value Ref Range   Specimen Description BLOOD RIGHT ARM    Special Requests      BOTTLES DRAWN AEROBIC AND ANAEROBIC AEB 6CC ANA 4CC   Culture NO GROWTH < 24 HOURS    Report Status PENDING   I-Stat CG4 Lactic Acid, ED     Status: None   Collection Time: 11/13/15  3:39 PM  Result Value Ref Range   Lactic Acid, Venous 0.99 0.5 - 2.0 mmol/L  Basic metabolic panel     Status: Abnormal   Collection Time: 11/14/15  5:38 AM  Result Value Ref Range   Sodium 137 135 - 145 mmol/L   Potassium 6.5 (HH) 3.5 - 5.1 mmol/L    Comment: CRITICAL RESULT CALLED TO, READ BACK BY AND VERIFIED WITH: MARTIN,M AT  6:45AM ON 11/14/15 BY FESTERMAN,C    Chloride 101 101 - 111 mmol/L   CO2 32 22 - 32 mmol/L   Glucose, Bld 220 (H) 65 - 99 mg/dL   BUN 34 (H) 6 - 20 mg/dL   Creatinine, Ser 1.98 (H) 0.44 - 1.00 mg/dL   Calcium 8.4 (L) 8.9 - 10.3 mg/dL   GFR calc non Af Amer 28 (L) >60 mL/min   GFR calc Af Amer 33 (L) >60 mL/min    Comment: (NOTE) The eGFR has been calculated using the CKD EPI equation. This calculation has not been validated in all clinical situations. eGFR's persistently <60 mL/min signify possible Chronic Kidney Disease.    Anion gap 4 (L) 5 - 15  Glucose, capillary     Status: Abnormal   Collection Time: 11/14/15  9:32 AM  Result Value Ref Range   Glucose-Capillary 178 (H) 65 - 99 mg/dL   Comment 1 Notify RN    Comment 2 Document in Chart     ABGS No results for input(s): PHART, PO2ART, TCO2, HCO3 in the last 72 hours.  Invalid input(s): PCO2 CULTURES Recent Results (from the past 240 hour(s))  Blood culture (routine x 2)     Status: None (Preliminary result)   Collection Time: 11/13/15  3:31 PM  Result Value Ref Range Status   Specimen Description BLOOD LEFT ARM  Final   Special Requests   Final    BOTTLES DRAWN AEROBIC AND ANAEROBIC AEB 10CC ANA6CC   Culture NO GROWTH < 24 HOURS  Final   Report Status PENDING  Incomplete  Blood culture (routine x 2)     Status: None (Preliminary result)   Collection Time: 11/13/15  3:37 PM  Result Value Ref Range Status   Specimen Description BLOOD RIGHT ARM  Final   Special Requests   Final    BOTTLES DRAWN AEROBIC AND ANAEROBIC AEB 6CC ANA 4CC   Culture NO GROWTH < 24 HOURS  Final   Report Status PENDING  Incomplete   Studies/Results: Dg Chest Portable 1 View  11/13/2015  CLINICAL DATA:  Shortness of Breath EXAM: PORTABLE CHEST 1 VIEW COMPARISON:  December 02, 2014 FINDINGS: Port-A-Cath  tip is in the superior vena cava. No pneumothorax. There is cardiomegaly with pulmonary venous hypertension. There is no frank edema or  consolidation. No adenopathy. IMPRESSION: Pulmonary vascular congestion.  No frank edema or consolidation. Electronically Signed   By: Lowella Grip III M.D.   On: 11/13/2015 15:45    Medications: I have reviewed the patient's current medications.  Assesment:   Principal Problem:   Cellulitis of leg without foot, right Active Problems:   Lymphedema of lower extremity   Diabetes mellitus (HCC)   Morbid obesity (HCC)   Hyperkalemia   Chronic diastolic CHF (congestive heart failure) (HCC)   Chronic respiratory failure with hypoxia (HCC)   CKD (chronic kidney disease), stage III   Anemia of chronic disease    Plan:  Medications reviewed Continue IV antibiotic Continue regular treatment    LOS: 1 day   Saphronia Ozdemir 11/14/2015, 10:17 AM

## 2015-11-14 NOTE — Progress Notes (Signed)
Paged Dr. [name] to notify of critical lab value Potassium level = 6.7.

## 2015-11-14 NOTE — Care Management Note (Signed)
Case Management Note  Patient Details  Name: KALANIE FEWELL MRN: [id number] Date of Birth: 08/27/1966  Subjective/Objective:                  Pt admitted from home with celluitis. Pt lives with her husband and will return home at discharge. Pt has a cane, walker, home O2 with [facility], and neb machine for home use. Pt stated that pts husband provides total assistance to pt for ADL's.  Action/Plan: Financial counselor is aware of self pay status. Pt has had medicaid in the past. If pt needs home health services at discharge, will need with [facility] as well as any DME needs. ? Need for MATCH at discharge.  Expected Discharge Date:  11/18/15               Expected Discharge Plan:  Home/Self Care  In-House Referral:  Financial Counselor  Discharge planning Services  CM Consult  Post Acute Care Choice:  NA Choice offered to:  NA  DME Arranged:    DME Agency:     HH Arranged:    HH Agency:     Status of Service:  In process, will continue to follow  Medicare Important Message Given:    Date Medicare IM Given:    Medicare IM give by:    Date Additional Medicare IM Given:    Additional Medicare Important Message give by:     If discussed at [place] of Stay Meetings, dates discussed:    Additional Comments:  Joylene Draft, RN 11/14/2015, 9:15 AM

## 2015-11-15 LAB — GLUCOSE, CAPILLARY
GLUCOSE-CAPILLARY: 49 mg/dL — AB (ref 65–99)
GLUCOSE-CAPILLARY: 82 mg/dL (ref 65–99)
Glucose-Capillary: 52 mg/dL — ABNORMAL LOW (ref 65–99)
Glucose-Capillary: 71 mg/dL (ref 65–99)
Glucose-Capillary: 67 mg/dL (ref 65–99)

## 2015-11-15 LAB — BASIC METABOLIC PANEL
Anion gap: 5 (ref 5–15)
BUN: 39 mg/dL — AB (ref 6–20)
CALCIUM: 8.4 mg/dL — AB (ref 8.9–10.3)
CO2: 31 mmol/L (ref 22–32)
CREATININE: 3.05 mg/dL — AB (ref 0.44–1.00)
Chloride: 101 mmol/L (ref 101–111)
GFR calc Af Amer: 20 mL/min — ABNORMAL LOW (ref >60)
GFR calc non Af Amer: 17 mL/min — ABNORMAL LOW (ref >60)
GLUCOSE: 55 mg/dL — AB (ref 65–99)
Potassium: 6.6 mmol/L (ref 3.5–5.1)
Sodium: 137 mmol/L (ref 135–145)

## 2015-11-15 LAB — URINALYSIS, ROUTINE W REFLEX MICROSCOPIC
Glucose, UA: NEGATIVE mg/dL
KETONES UR: NEGATIVE mg/dL
NITRITE: NEGATIVE
Protein, ur: 100 mg/dL — AB
SPECIFIC GRAVITY, URINE: 1.02 (ref 1.005–1.030)
pH: 6.5 (ref 5.0–8.0)

## 2015-11-15 LAB — URINE MICROSCOPIC-ADD ON

## 2015-11-15 LAB — HIV ANTIBODY (ROUTINE TESTING W REFLEX): HIV Screen 4th Generation wRfx: NONREACTIVE

## 2015-11-15 MED ORDER — SODIUM CHLORIDE 0.45 % IV SOLN
INTRAVENOUS | Status: DC
  Administered 2015-11-15 – 2015-11-18 (×6): via INTRAVENOUS

## 2015-11-15 MED ORDER — FUROSEMIDE 10 MG/ML IJ SOLN
200.0000 mg | Freq: Two times a day (BID) | INTRAVENOUS | Status: DC
  Administered 2015-11-15 – 2015-11-28 (×26): 200 mg via INTRAVENOUS
  Filled 2015-11-15 (×32): qty 20

## 2015-11-15 MED ORDER — SODIUM POLYSTYRENE SULFONATE 15 GM/60ML PO SUSP
30.0000 g | Freq: Once | ORAL | Status: AC
  Administered 2015-11-15: 30 g via ORAL
  Filled 2015-11-15: qty 120

## 2015-11-15 NOTE — Progress Notes (Signed)
Patient has had only 75 ml of tea colored urine output since the start of this shift at 1900.  Foley assess for patency and it seems to be patent.  The foley is below the level of the bladder.  Dr. Willey Blade notified via phone and he said to let Dr. [name] no in am.

## 2015-11-15 NOTE — Consult Note (Addendum)
Reason for Consult: Hyperkalemia, acute kidney injury superimposed on chronic Referring Physician: [name] is an 49 y.o. female.  HPI: She is a patient was history of hypertension, morbid obesity, lymphedema, chronic renal failure stage III presently came to the hospital because of complaining of cellulitis of her foot. Patient also states that she has some nausea, vomiting, unable to keep anything down and decreased urine output the last couple of days. Patient also has some occasional difficulty in breathing. She denies any diarrhea, and no abdominal pain. When she was evaluated patient was found to have hyperkalemia and acute kidney injury superimposed on chronic hence admitted to the hospital.  Past Medical History  Diagnosis Date  . Diabetes mellitus   . Chronic pain   . Lymph edema   . Cellulitis   . Chiari malformation     s/p surgery  . S/P colonoscopy 05/26/2007    Dr. Laural Golden sigmoid diverticulosis random biopsies benign  . CAD (coronary artery disease)   . Hypercholesterolemia   . Hypertension   . COPD (chronic obstructive pulmonary disease) ([place])   . S/P endoscopy 05/01/2009    Dr. Penelope Coop pill-induced esophageal ulcerations distal to midesophagus, 2 small ulcers in the antrum of the stomach  . Fibromyalgia   . On home O2   . Obesity hypoventilation syndrome ([place])   . Hx of echocardiogram 10/2011    EF 55-60%    Past Surgical History  Procedure Laterality Date  . Cesarean section       x 2  . Abdominal hysterectomy    . Tonsillectomy    . Adenoidectomy    . Thyroidectomy, partial    . Craniectomy suboccipital w/ cervical laminectomy / chiari    . Portacath placement  07/05/2012    Procedure: INSERTION PORT-A-CATH;  Surgeon: Donato Heinz, MD;  Location: [facility];  Service: General;  Laterality: Left;  subclavian    Family History  Problem Relation Age of Onset  . Colon cancer Mother 62  . Stroke Mother 11  . Coronary artery disease Mother      Social History:  reports that she has never smoked. She has never used smokeless tobacco. She reports that she does not drink alcohol or use illicit drugs.  Allergies:  Allergies  Allergen Reactions  . Vancomycin Nausea And Vomiting    Infusion "made me feel like I was dying" had to be readmitted to hospital  . Contrast Media [Iodinated Diagnostic Agents] Hives and Swelling    Dye for cardiac cath. Tongue swells  . Pineapple Itching and Swelling  . Pneumococcal Vaccines Swelling    Turns skin black, and swelling  . Shellfish Allergy Itching and Swelling    Medications: I have reviewed the patient's current medications.  Results for orders placed or performed during the hospital encounter of 11/13/15 (from the past 48 hour(s))  CBC with Differential/Platelet     Status: Abnormal   Collection Time: 11/13/15  3:00 PM  Result Value Ref Range   WBC 7.2 4.0 - 10.5 K/uL   RBC 3.77 (L) 3.87 - 5.11 MIL/uL   Hemoglobin 10.6 (L) 12.0 - 15.0 g/dL   HCT 33.5 (L) 36.0 - 46.0 %   MCV 88.9 78.0 - 100.0 fL   MCH 28.1 26.0 - 34.0 pg   MCHC 31.6 30.0 - 36.0 g/dL   RDW 16.5 (H) 11.5 - 15.5 %   Platelets 235 150 - 400 K/uL   Neutrophils Relative % 73 %  Neutro Abs 5.2 1.7 - 7.7 K/uL   Lymphocytes Relative 18 %   Lymphs Abs 1.3 0.7 - 4.0 K/uL   Monocytes Relative 7 %   Monocytes Absolute 0.5 0.1 - 1.0 K/uL   Eosinophils Relative 2 %   Eosinophils Absolute 0.1 0.0 - 0.7 K/uL   Basophils Relative 0 %   Basophils Absolute 0.0 0.0 - 0.1 K/uL  Comprehensive metabolic panel     Status: Abnormal   Collection Time: 11/13/15  3:00 PM  Result Value Ref Range   Sodium 139 135 - 145 mmol/L   Potassium 5.3 (H) 3.5 - 5.1 mmol/L   Chloride 102 101 - 111 mmol/L   CO2 31 22 - 32 mmol/L   Glucose, Bld 195 (H) 65 - 99 mg/dL   BUN 31 (H) 6 - 20 mg/dL   Creatinine, Ser 1.62 (H) 0.44 - 1.00 mg/dL   Calcium 8.8 (L) 8.9 - 10.3 mg/dL   Total Protein 7.3 6.5 - 8.1 g/dL   Albumin 2.8 (L) 3.5 - 5.0 g/dL    AST 13 (L) 15 - 41 U/L   ALT 10 (L) 14 - 54 U/L   Alkaline Phosphatase 134 (H) 38 - 126 U/L   Total Bilirubin 0.8 0.3 - 1.2 mg/dL   GFR calc non Af Amer 36 (L) >60 mL/min   GFR calc Af Amer 42 (L) >60 mL/min    Comment: (NOTE) The eGFR has been calculated using the CKD EPI equation. This calculation has not been validated in all clinical situations. eGFR's persistently <60 mL/min signify possible Chronic Kidney Disease.    Anion gap 6 5 - 15  Brain natriuretic peptide     Status: Abnormal   Collection Time: 11/13/15  3:00 PM  Result Value Ref Range   B Natriuretic Peptide 315.0 (H) 0.0 - 100.0 pg/mL  Blood culture (routine x 2)     Status: None (Preliminary result)   Collection Time: 11/13/15  3:31 PM  Result Value Ref Range   Specimen Description BLOOD LEFT ARM    Special Requests      BOTTLES DRAWN AEROBIC AND ANAEROBIC AEB 10CC ANA6CC   Culture NO GROWTH 2 DAYS    Report Status PENDING   Blood culture (routine x 2)     Status: None (Preliminary result)   Collection Time: 11/13/15  3:37 PM  Result Value Ref Range   Specimen Description BLOOD RIGHT ARM    Special Requests      BOTTLES DRAWN AEROBIC AND ANAEROBIC AEB 6CC ANA 4CC   Culture NO GROWTH 2 DAYS    Report Status PENDING   I-Stat CG4 Lactic Acid, ED     Status: None   Collection Time: 11/13/15  3:39 PM  Result Value Ref Range   Lactic Acid, Venous 0.99 0.5 - 2.0 mmol/L  Basic metabolic panel     Status: Abnormal   Collection Time: 11/14/15  5:38 AM  Result Value Ref Range   Sodium 137 135 - 145 mmol/L   Potassium 6.5 (HH) 3.5 - 5.1 mmol/L    Comment: CRITICAL RESULT CALLED TO, READ BACK BY AND VERIFIED WITH: MARTIN,M AT 6:45AM ON 11/14/15 BY FESTERMAN,C    Chloride 101 101 - 111 mmol/L   CO2 32 22 - 32 mmol/L   Glucose, Bld 220 (H) 65 - 99 mg/dL   BUN 34 (H) 6 - 20 mg/dL   Creatinine, Ser 1.98 (H) 0.44 - 1.00 mg/dL   Calcium 8.4 (L) 8.9 - 10.3 mg/dL  GFR calc non Af Amer 28 (L) >60 mL/min   GFR calc Af  Amer 33 (L) >60 mL/min    Comment: (NOTE) The eGFR has been calculated using the CKD EPI equation. This calculation has not been validated in all clinical situations. eGFR's persistently <60 mL/min signify possible Chronic Kidney Disease.    Anion gap 4 (L) 5 - 15  HIV antibody     Status: None   Collection Time: 11/14/15  5:38 AM  Result Value Ref Range   HIV Screen 4th Generation wRfx Non Reactive Non Reactive    Comment: (NOTE) Performed At: [facility] [place], [place] [id number] Lindon Romp MD [id number]   Glucose, capillary     Status: Abnormal   Collection Time: 11/14/15  9:32 AM  Result Value Ref Range   Glucose-Capillary 178 (H) 65 - 99 mg/dL   Comment 1 Notify RN    Comment 2 Document in Chart   Glucose, capillary     Status: Abnormal   Collection Time: 11/14/15 11:37 AM  Result Value Ref Range   Glucose-Capillary 193 (H) 65 - 99 mg/dL   Comment 1 Notify RN    Comment 2 Document in Chart   Basic metabolic panel     Status: Abnormal   Collection Time: 11/14/15 11:51 AM  Result Value Ref Range   Sodium 137 135 - 145 mmol/L   Potassium 6.7 (HH) 3.5 - 5.1 mmol/L    Comment: CRITICAL RESULT CALLED TO, READ BACK BY AND VERIFIED WITH: VAUGHN,J AT 12:25PM ON 11/14/15 BY FESTERMAN,C    Chloride 101 101 - 111 mmol/L   CO2 32 22 - 32 mmol/L   Glucose, Bld 201 (H) 65 - 99 mg/dL   BUN 35 (H) 6 - 20 mg/dL   Creatinine, Ser 2.30 (H) 0.44 - 1.00 mg/dL   Calcium 8.6 (L) 8.9 - 10.3 mg/dL   GFR calc non Af Amer 24 (L) >60 mL/min   GFR calc Af Amer 28 (L) >60 mL/min    Comment: (NOTE) The eGFR has been calculated using the CKD EPI equation. This calculation has not been validated in all clinical situations. eGFR's persistently <60 mL/min signify possible Chronic Kidney Disease.    Anion gap 4 (L) 5 - 15  Glucose, capillary     Status: None   Collection Time: 11/14/15  6:59 PM  Result Value Ref Range   Glucose-Capillary 92 65 - 99 mg/dL    Comment 1 Notify RN    Comment 2 Document in Chart   Glucose, capillary     Status: Abnormal   Collection Time: 11/14/15  8:52 PM  Result Value Ref Range   Glucose-Capillary 60 (L) 65 - 99 mg/dL   Comment 1 Notify RN    Comment 2 Document in Chart   Glucose, capillary     Status: None   Collection Time: 11/14/15  9:41 PM  Result Value Ref Range   Glucose-Capillary 81 65 - 99 mg/dL  Urinalysis, Routine w reflex microscopic (not at [facility])     Status: Abnormal   Collection Time: 11/15/15  5:32 AM  Result Value Ref Range   Color, Urine BROWN (A) YELLOW    Comment: BIOCHEMICALS MAY BE AFFECTED BY COLOR   APPearance CLOUDY (A) CLEAR   Specific Gravity, Urine 1.020 1.005 - 1.030   pH 6.5 5.0 - 8.0   Glucose, UA NEGATIVE NEGATIVE mg/dL   Hgb urine dipstick LARGE (A) NEGATIVE   Bilirubin Urine SMALL (A)  NEGATIVE   Ketones, ur NEGATIVE NEGATIVE mg/dL   Protein, ur 100 (A) NEGATIVE mg/dL   Nitrite NEGATIVE NEGATIVE   Leukocytes, UA MODERATE (A) NEGATIVE  Urine microscopic-add on     Status: Abnormal   Collection Time: 11/15/15  5:32 AM  Result Value Ref Range   Squamous Epithelial / LPF 0-5 (A) NONE SEEN    Comment: Please note change in reference range.   WBC, UA 6-30 0 - 5 WBC/hpf    Comment: Please note change in reference range.   RBC / HPF TOO NUMEROUS TO COUNT 0 - 5 RBC/hpf    Comment: Please note change in reference range.   Bacteria, UA FEW (A) NONE SEEN    Comment: Please note change in reference range.  Basic metabolic panel     Status: Abnormal   Collection Time: 11/15/15  7:16 AM  Result Value Ref Range   Sodium 137 135 - 145 mmol/L   Potassium 6.6 (HH) 3.5 - 5.1 mmol/L    Comment: CRITICAL RESULT CALLED TO, READ BACK BY AND VERIFIED WITH: VON,J AT 0277 ON 11/15/2015 BY WOODS, M    Chloride 101 101 - 111 mmol/L   CO2 31 22 - 32 mmol/L   Glucose, Bld 55 (L) 65 - 99 mg/dL   BUN 39 (H) 6 - 20 mg/dL   Creatinine, Ser 3.05 (H) 0.44 - 1.00 mg/dL   Calcium 8.4 (L) 8.9 -  10.3 mg/dL   GFR calc non Af Amer 17 (L) >60 mL/min   GFR calc Af Amer 20 (L) >60 mL/min    Comment: (NOTE) The eGFR has been calculated using the CKD EPI equation. This calculation has not been validated in all clinical situations. eGFR's persistently <60 mL/min signify possible Chronic Kidney Disease.    Anion gap 5 5 - 15  Glucose, capillary     Status: Abnormal   Collection Time: 11/15/15  7:29 AM  Result Value Ref Range   Glucose-Capillary 49 (L) 65 - 99 mg/dL  Glucose, capillary     Status: Abnormal   Collection Time: 11/15/15  8:00 AM  Result Value Ref Range   Glucose-Capillary 52 (L) 65 - 99 mg/dL    Dg Chest Portable 1 View  11/13/2015  CLINICAL DATA:  Shortness of Breath EXAM: PORTABLE CHEST 1 VIEW COMPARISON:  December 02, 2014 FINDINGS: Port-A-Cath tip is in the superior vena cava. No pneumothorax. There is cardiomegaly with pulmonary venous hypertension. There is no frank edema or consolidation. No adenopathy. IMPRESSION: Pulmonary vascular congestion.  No frank edema or consolidation. Electronically Signed   By: Lowella Grip III M.D.   On: 11/13/2015 15:45    Review of Systems  Constitutional: Positive for chills.  Respiratory: Positive for shortness of breath.   Cardiovascular: Positive for leg swelling.  Gastrointestinal: Positive for nausea and vomiting. Negative for abdominal pain and diarrhea.  Musculoskeletal: Positive for back pain.  Neurological: Positive for tremors.   Blood pressure 96/51, pulse 59, temperature 97.5 F (36.4 C), temperature source Oral, resp. rate 18, height _0  (1.651 m), weight 458 lb (207.747 kg), SpO2 91 %. Physical Exam  Constitutional: She is oriented to person, place, and time. No distress.  Eyes: No scleral icterus.  Neck: No JVD present.  Respiratory: No respiratory distress. She has no wheezes. She has no rales.  GI: There is no tenderness.  Obase and difficult to palpate organomegaly  Musculoskeletal: She exhibits  edema.  Neurological: She is alert and oriented to person, place,  and time.    Assessment/Plan: Problem #1 hyperkalemia: This could be secondary to high potassium intake/worsening of renal failure/type IV RTA. This has been a recurrent issue for the patient. Problem #2 acute kidney injury superimposed on chronic. Most likely secondary to prerenal however ATN and other etiologies cannot be ruled out. Problem #3 chronic renal failure: Stage III and long-standing. This was thought to be secondary to diabetes/hypertension/obesity related glomerulopathy. Problem #4 lymphedema Problem #5 cellulitis of her right leg Problem #6 hypertension: Her blood pressure is reasonably controlled Problem #7 diabetes Problem #8 morbid obesity Problem #9 anemia Plan: Agree with present treatment We'll start patient on half-normal saline at 100 mL per hour We'll start patient on Lasix 200 mg IV twice a day so as to help [place] with her hyperkalemia We'll check her basic metabolic panel in the morning.  [name] S 11/15/2015, 10:14 AM

## 2015-11-15 NOTE — Progress Notes (Signed)
Subjective: She was admitted with cellulitis of the legs. She has acute on chronic renal failure as well.  Objective: Vital signs in last 24 hours: Temp:  [97.5 F (36.4 C)-97.9 F (36.6 C)] 97.5 F (36.4 C) (11/26 0528) Pulse Rate:  [53-64] 59 (11/26 0528) Resp:  [18-20] 18 (11/26 0528) BP: (96-119)/(51-72) 96/51 mmHg (11/26 0528) SpO2:  [91 %-96 %] 91 % (11/26 0528) Weight:  [207.747 kg (458 lb)] 207.747 kg (458 lb) (11/26 0528) Weight change: -4.536 kg (-10 lb) Last BM Date: 11/12/15  Intake/Output from previous day: 11/25 0701 - 11/26 0700 In: 313 [P.O.:240; I.V.:3] Out: 185 [Urine:185]  PHYSICAL EXAM General appearance: alert, cooperative, mild distress and morbidly obese Resp: clear to auscultation bilaterally Cardio: regular rate and rhythm, S1, S2 normal, no murmur, click, rub or gallop GI: soft, non-tender; bowel sounds normal; no masses,  no organomegaly Extremities: She has significant lymphedema of both legs. The right lower extremity mostly in the calf area is inflamed and irritated. It is weeping some fluid.  Lab Results:  Results for orders placed or performed during the hospital encounter of 11/13/15 (from the past 48 hour(s))  CBC with Differential/Platelet     Status: Abnormal   Collection Time: 11/13/15  3:00 PM  Result Value Ref Range   WBC 7.2 4.0 - 10.5 K/uL   RBC 3.77 (L) 3.87 - 5.11 MIL/uL   Hemoglobin 10.6 (L) 12.0 - 15.0 g/dL   HCT 33.5 (L) 36.0 - 46.0 %   MCV 88.9 78.0 - 100.0 fL   MCH 28.1 26.0 - 34.0 pg   MCHC 31.6 30.0 - 36.0 g/dL   RDW 16.5 (H) 11.5 - 15.5 %   Platelets 235 150 - 400 K/uL   Neutrophils Relative % 73 %   Neutro Abs 5.2 1.7 - 7.7 K/uL   Lymphocytes Relative 18 %   Lymphs Abs 1.3 0.7 - 4.0 K/uL   Monocytes Relative 7 %   Monocytes Absolute 0.5 0.1 - 1.0 K/uL   Eosinophils Relative 2 %   Eosinophils Absolute 0.1 0.0 - 0.7 K/uL   Basophils Relative 0 %   Basophils Absolute 0.0 0.0 - 0.1 K/uL  Comprehensive metabolic  panel     Status: Abnormal   Collection Time: 11/13/15  3:00 PM  Result Value Ref Range   Sodium 139 135 - 145 mmol/L   Potassium 5.3 (H) 3.5 - 5.1 mmol/L   Chloride 102 101 - 111 mmol/L   CO2 31 22 - 32 mmol/L   Glucose, Bld 195 (H) 65 - 99 mg/dL   BUN 31 (H) 6 - 20 mg/dL   Creatinine, Ser 1.62 (H) 0.44 - 1.00 mg/dL   Calcium 8.8 (L) 8.9 - 10.3 mg/dL   Total Protein 7.3 6.5 - 8.1 g/dL   Albumin 2.8 (L) 3.5 - 5.0 g/dL   AST 13 (L) 15 - 41 U/L   ALT 10 (L) 14 - 54 U/L   Alkaline Phosphatase 134 (H) 38 - 126 U/L   Total Bilirubin 0.8 0.3 - 1.2 mg/dL   GFR calc non Af Amer 36 (L) >60 mL/min   GFR calc Af Amer 42 (L) >60 mL/min    Comment: (NOTE) The eGFR has been calculated using the CKD EPI equation. This calculation has not been validated in all clinical situations. eGFR's persistently <60 mL/min signify possible Chronic Kidney Disease.    Anion gap 6 5 - 15  Brain natriuretic peptide     Status: Abnormal   Collection Time:  11/13/15  3:00 PM  Result Value Ref Range   B Natriuretic Peptide 315.0 (H) 0.0 - 100.0 pg/mL  Blood culture (routine x 2)     Status: None (Preliminary result)   Collection Time: 11/13/15  3:31 PM  Result Value Ref Range   Specimen Description BLOOD LEFT ARM    Special Requests      BOTTLES DRAWN AEROBIC AND ANAEROBIC AEB 10CC ANA6CC   Culture NO GROWTH 2 DAYS    Report Status PENDING   Blood culture (routine x 2)     Status: None (Preliminary result)   Collection Time: 11/13/15  3:37 PM  Result Value Ref Range   Specimen Description BLOOD RIGHT ARM    Special Requests      BOTTLES DRAWN AEROBIC AND ANAEROBIC AEB 6CC ANA 4CC   Culture NO GROWTH 2 DAYS    Report Status PENDING   I-Stat CG4 Lactic Acid, ED     Status: None   Collection Time: 11/13/15  3:39 PM  Result Value Ref Range   Lactic Acid, Venous 0.99 0.5 - 2.0 mmol/L  Basic metabolic panel     Status: Abnormal   Collection Time: 11/14/15  5:38 AM  Result Value Ref Range   Sodium 137 135  - 145 mmol/L   Potassium 6.5 (HH) 3.5 - 5.1 mmol/L    Comment: CRITICAL RESULT CALLED TO, READ BACK BY AND VERIFIED WITH: MARTIN,M AT 6:45AM ON 11/14/15 BY FESTERMAN,C    Chloride 101 101 - 111 mmol/L   CO2 32 22 - 32 mmol/L   Glucose, Bld 220 (H) 65 - 99 mg/dL   BUN 34 (H) 6 - 20 mg/dL   Creatinine, Ser 1.98 (H) 0.44 - 1.00 mg/dL   Calcium 8.4 (L) 8.9 - 10.3 mg/dL   GFR calc non Af Amer 28 (L) >60 mL/min   GFR calc Af Amer 33 (L) >60 mL/min    Comment: (NOTE) The eGFR has been calculated using the CKD EPI equation. This calculation has not been validated in all clinical situations. eGFR's persistently <60 mL/min signify possible Chronic Kidney Disease.    Anion gap 4 (L) 5 - 15  HIV antibody     Status: None   Collection Time: 11/14/15  5:38 AM  Result Value Ref Range   HIV Screen 4th Generation wRfx Non Reactive Non Reactive    Comment: (NOTE) Performed At: [facility] [address] [place], [place] [id number] Lindon Romp MD GQ:[id number]   Glucose, capillary     Status: Abnormal   Collection Time: 11/14/15  9:32 AM  Result Value Ref Range   Glucose-Capillary 178 (H) 65 - 99 mg/dL   Comment 1 Notify RN    Comment 2 Document in Chart   Glucose, capillary     Status: Abnormal   Collection Time: 11/14/15 11:37 AM  Result Value Ref Range   Glucose-Capillary 193 (H) 65 - 99 mg/dL   Comment 1 Notify RN    Comment 2 Document in Chart   Basic metabolic panel     Status: Abnormal   Collection Time: 11/14/15 11:51 AM  Result Value Ref Range   Sodium 137 135 - 145 mmol/L   Potassium 6.7 (HH) 3.5 - 5.1 mmol/L    Comment: CRITICAL RESULT CALLED TO, READ BACK BY AND VERIFIED WITH: VAUGHN,J AT 12:25PM ON 11/14/15 BY FESTERMAN,C    Chloride 101 101 - 111 mmol/L   CO2 32 22 - 32 mmol/L   Glucose, Bld 201 (H) 65 -  99 mg/dL   BUN 35 (H) 6 - 20 mg/dL   Creatinine, Ser 2.30 (H) 0.44 - 1.00 mg/dL   Calcium 8.6 (L) 8.9 - 10.3 mg/dL   GFR calc non Af Amer 24 (L) >60  mL/min   GFR calc Af Amer 28 (L) >60 mL/min    Comment: (NOTE) The eGFR has been calculated using the CKD EPI equation. This calculation has not been validated in all clinical situations. eGFR's persistently <60 mL/min signify possible Chronic Kidney Disease.    Anion gap 4 (L) 5 - 15  Glucose, capillary     Status: None   Collection Time: 11/14/15  6:59 PM  Result Value Ref Range   Glucose-Capillary 92 65 - 99 mg/dL   Comment 1 Notify RN    Comment 2 Document in Chart   Glucose, capillary     Status: Abnormal   Collection Time: 11/14/15  8:52 PM  Result Value Ref Range   Glucose-Capillary 60 (L) 65 - 99 mg/dL   Comment 1 Notify RN    Comment 2 Document in Chart   Glucose, capillary     Status: None   Collection Time: 11/14/15  9:41 PM  Result Value Ref Range   Glucose-Capillary 81 65 - 99 mg/dL  Urinalysis, Routine w reflex microscopic (not at [facility])     Status: Abnormal   Collection Time: 11/15/15  5:32 AM  Result Value Ref Range   Color, Urine BROWN (A) YELLOW    Comment: BIOCHEMICALS MAY BE AFFECTED BY COLOR   APPearance CLOUDY (A) CLEAR   Specific Gravity, Urine 1.020 1.005 - 1.030   pH 6.5 5.0 - 8.0   Glucose, UA NEGATIVE NEGATIVE mg/dL   Hgb urine dipstick LARGE (A) NEGATIVE   Bilirubin Urine SMALL (A) NEGATIVE   Ketones, ur NEGATIVE NEGATIVE mg/dL   Protein, ur 100 (A) NEGATIVE mg/dL   Nitrite NEGATIVE NEGATIVE   Leukocytes, UA MODERATE (A) NEGATIVE  Urine microscopic-add on     Status: Abnormal   Collection Time: 11/15/15  5:32 AM  Result Value Ref Range   Squamous Epithelial / LPF 0-5 (A) NONE SEEN    Comment: Please note change in reference range.   WBC, UA 6-30 0 - 5 WBC/hpf    Comment: Please note change in reference range.   RBC / HPF TOO NUMEROUS TO COUNT 0 - 5 RBC/hpf    Comment: Please note change in reference range.   Bacteria, UA FEW (A) NONE SEEN    Comment: Please note change in reference range.  Basic metabolic panel     Status: Abnormal    Collection Time: 11/15/15  7:16 AM  Result Value Ref Range   Sodium 137 135 - 145 mmol/L   Potassium 6.6 (HH) 3.5 - 5.1 mmol/L    Comment: CRITICAL RESULT CALLED TO, READ BACK BY AND VERIFIED WITH: VON,J AT 9233 ON 11/15/2015 BY WOODS, M    Chloride 101 101 - 111 mmol/L   CO2 31 22 - 32 mmol/L   Glucose, Bld 55 (L) 65 - 99 mg/dL   BUN 39 (H) 6 - 20 mg/dL   Creatinine, Ser 3.05 (H) 0.44 - 1.00 mg/dL   Calcium 8.4 (L) 8.9 - 10.3 mg/dL   GFR calc non Af Amer 17 (L) >60 mL/min   GFR calc Af Amer 20 (L) >60 mL/min    Comment: (NOTE) The eGFR has been calculated using the CKD EPI equation. This calculation has not been validated in all clinical situations.  eGFR's persistently <60 mL/min signify possible Chronic Kidney Disease.    Anion gap 5 5 - 15  Glucose, capillary     Status: Abnormal   Collection Time: 11/15/15  7:29 AM  Result Value Ref Range   Glucose-Capillary 49 (L) 65 - 99 mg/dL  Glucose, capillary     Status: Abnormal   Collection Time: 11/15/15  8:00 AM  Result Value Ref Range   Glucose-Capillary 52 (L) 65 - 99 mg/dL    ABGS No results for input(s): PHART, PO2ART, TCO2, HCO3 in the last 72 hours.  Invalid input(s): PCO2 CULTURES Recent Results (from the past 240 hour(s))  Blood culture (routine x 2)     Status: None (Preliminary result)   Collection Time: 11/13/15  3:31 PM  Result Value Ref Range Status   Specimen Description BLOOD LEFT ARM  Final   Special Requests   Final    BOTTLES DRAWN AEROBIC AND ANAEROBIC AEB 10CC ANA6CC   Culture NO GROWTH 2 DAYS  Final   Report Status PENDING  Incomplete  Blood culture (routine x 2)     Status: None (Preliminary result)   Collection Time: 11/13/15  3:37 PM  Result Value Ref Range Status   Specimen Description BLOOD RIGHT ARM  Final   Special Requests   Final    BOTTLES DRAWN AEROBIC AND ANAEROBIC AEB 6CC ANA 4CC   Culture NO GROWTH 2 DAYS  Final   Report Status PENDING  Incomplete   Studies/Results: Dg Chest  Portable 1 View  11/13/2015  CLINICAL DATA:  Shortness of Breath EXAM: PORTABLE CHEST 1 VIEW COMPARISON:  December 02, 2014 FINDINGS: Port-A-Cath tip is in the superior vena cava. No pneumothorax. There is cardiomegaly with pulmonary venous hypertension. There is no frank edema or consolidation. No adenopathy. IMPRESSION: Pulmonary vascular congestion.  No frank edema or consolidation. Electronically Signed   By: Lowella Grip III M.D.   On: 11/13/2015 15:45    Medications:  Prior to Admission:  Prescriptions prior to admission  Medication Sig Dispense Refill Last Dose  . ALPRAZolam (XANAX) 0.5 MG tablet Take 0.5 mg by mouth 3 (three) times daily as needed for anxiety or sleep.    11/12/2015 at Unknown time  . amLODipine (NORVASC) 10 MG tablet Take 10 mg by mouth daily.   11/12/2015 at Unknown time  . insulin NPH-insulin regular (NOVOLIN 70/30) (70-30) 100 UNIT/ML injection Inject 60 Units into the skin 2 (two) times daily.   11/12/2015 at Unknown time  . metoprolol (LOPRESSOR) 50 MG tablet Take 50 mg by mouth 2 (two) times daily.   11/12/2015 at Unknown time  . Oxycodone HCl 10 MG TABS Take 10 mg by mouth 4 (four) times daily as needed (pain).    Past Week at Unknown time  . potassium chloride SA (K-DUR,KLOR-CON) 20 MEQ tablet Take 20 mEq by mouth 3 (three) times daily.   11/12/2015 at Unknown time  . torsemide (DEMADEX) 100 MG tablet Take 100 mg by mouth 2 (two) times daily.    11/12/2015 at Unknown time  . acetaminophen (TYLENOL) 325 MG tablet Take 650 mg by mouth every 6 (six) hours as needed. fever   unknown  . diphenhydrAMINE (BENADRYL) 25 mg capsule Take 25 mg by mouth every 6 (six) hours as needed for allergies.   unknown  . docusate sodium (COLACE) 100 MG capsule Take 100 mg by mouth 2 (two) times daily as needed. For constipation   unknown  . HYDROcodone-acetaminophen (NORCO/VICODIN) 5-325 MG per tablet  Take 2 tablets by mouth every 4 (four) hours as needed. (Patient not taking:  Reported on 12/02/2014) 10 tablet 0   . oseltamivir (TAMIFLU) 75 MG capsule Take 1 capsule (75 mg total) by mouth every 12 (twelve) hours. (Patient not taking: Reported on 12/02/2014) 10 capsule 0   . oxyCODONE-acetaminophen (PERCOCET/ROXICET) 5-325 MG per tablet Take 1 tablet by mouth every 6 (six) hours as needed for severe pain. 15 tablet 0 unknown   Scheduled: . clindamycin (CLEOCIN) IV  600 mg Intravenous 4 times per day  . enoxaparin (LOVENOX) injection  100 mg Subcutaneous Q24H  . furosemide  200 mg Intravenous BID  . insulin aspart protamine- aspart  60 Units Subcutaneous BID WC  . metoprolol  50 mg Oral BID  . nystatin   Topical BID  . sodium chloride  3 mL Intravenous Q12H  . sodium chloride  3 mL Intravenous Q12H  . sodium polystyrene  30 g Oral Once   Continuous: . sodium chloride     DGL:OVFIEP chloride, acetaminophen **OR** acetaminophen, acetaminophen, ALPRAZolam, diphenhydrAMINE, ondansetron (ZOFRAN) IV, oxyCODONE, sodium chloride  Assesment: She was admitted with cellulitis of the leg. This is related to chronic lymphedema. This is a recurrent problem. In addition to that she has diabetes which is pretty well controlled. She has chronic diastolic heart failure and that seems to be doing okay. She has an acute on chronic kidney disease and nephrology consultation has been requested. She is hyperkalemic and I've given her a dose of Kayexalate. She is morbidly obese which is unchanged Principal Problem:   Cellulitis of leg without foot, right Active Problems:   Lymphedema of lower extremity   Diabetes mellitus (HCC)   Morbid obesity (HCC)   Hyperkalemia   Chronic diastolic CHF (congestive heart failure) (HCC)   Chronic respiratory failure with hypoxia (HCC)   CKD (chronic kidney disease), stage III   Anemia of chronic disease    Plan: Continue current treatment. Nephrology consultation. Kayexalate today.    LOS: 2 days   Denson Niccoli L 11/15/2015, 10:44  AM

## 2015-11-15 NOTE — Progress Notes (Signed)
CRITICAL VALUE ALERT  Critical value received:  K= 6.6  Date of notification:  11/15/15  Time of notification:  0815  Critical value read back:Yes.    Nurse who received alert:  Rosealee Albee   MD notified (1st page):  Dr. [name]   Time of first page:  0815  MD notified (2nd page):  Time of second page:  Responding MD:    Time MD responded:

## 2015-11-15 NOTE — Progress Notes (Signed)
Hypoglycemic Event  CBG: 60 11/14/15 2052  Treatment: D50 IV 50 mL  Symptoms: None  Follow-up CBG: Time:2141 CBG Result:81  Possible Reasons for Event: Vomiting and Inadequate meal intake  Comments/MD notified:Nursing staff to continue to monitor    [name]

## 2015-11-16 LAB — BASIC METABOLIC PANEL
ANION GAP: 9 (ref 5–15)
BUN: 45 mg/dL — ABNORMAL HIGH (ref 6–20)
CO2: 29 mmol/L (ref 22–32)
Calcium: 8.5 mg/dL — ABNORMAL LOW (ref 8.9–10.3)
Chloride: 99 mmol/L — ABNORMAL LOW (ref 101–111)
Creatinine, Ser: 3.52 mg/dL — ABNORMAL HIGH (ref 0.44–1.00)
GFR, EST AFRICAN AMERICAN: 16 mL/min — AB (ref >60)
GFR, EST NON AFRICAN AMERICAN: 14 mL/min — AB (ref >60)
GLUCOSE: 66 mg/dL (ref 65–99)
POTASSIUM: 5.7 mmol/L — AB (ref 3.5–5.1)
SODIUM: 137 mmol/L (ref 135–145)

## 2015-11-16 LAB — GLUCOSE, CAPILLARY
GLUCOSE-CAPILLARY: 63 mg/dL — AB (ref 65–99)
GLUCOSE-CAPILLARY: 88 mg/dL (ref 65–99)
GLUCOSE-CAPILLARY: 96 mg/dL (ref 65–99)
GLUCOSE-CAPILLARY: 92 mg/dL (ref 65–99)

## 2015-11-16 MED ORDER — METOLAZONE 5 MG PO TABS
5.0000 mg | ORAL_TABLET | Freq: Two times a day (BID) | ORAL | Status: DC
  Administered 2015-11-16 – 2015-11-27 (×23): 5 mg via ORAL
  Filled 2015-11-16 (×25): qty 1

## 2015-11-16 MED ORDER — ONDANSETRON HCL 4 MG/2ML IJ SOLN
4.0000 mg | Freq: Once | INTRAMUSCULAR | Status: AC
  Administered 2015-11-16: 4 mg via INTRAVENOUS

## 2015-11-16 MED ORDER — SODIUM POLYSTYRENE SULFONATE 15 GM/60ML PO SUSP
30.0000 g | Freq: Once | ORAL | Status: AC
  Administered 2015-11-16: 30 g via ORAL
  Filled 2015-11-16: qty 120

## 2015-11-16 NOTE — Progress Notes (Signed)
Subjective: She has diarrhea from Kayexalate. She has no other new complaints.  Objective: Vital signs in last 24 hours: Temp:  [97.6 F (36.4 C)-98.3 F (36.8 C)] 98.3 F (36.8 C) (11/27 0526) Pulse Rate:  [56-66] 66 (11/27 0526) Resp:  [18] 18 (11/27 0526) BP: (98-136)/(53-89) 125/89 mmHg (11/27 0526) SpO2:  [92 %-97 %] 96 % (11/27 0526) Weight:  [205.026 kg (452 lb)] 205.026 kg (452 lb) (11/27 0526) Weight change: -2.722 kg (-6 lb) Last BM Date: 11/15/15  Intake/Output from previous day: 11/26 0701 - 11/27 0700 In: 524 [P.O.:240; I.V.:284] Out: 200 [Urine:200]  PHYSICAL EXAM General appearance: alert, cooperative, mild distress and morbidly obese Resp: clear to auscultation bilaterally Cardio: regular rate and rhythm, S1, S2 normal, no murmur, click, rub or gallop GI: soft, non-tender; bowel sounds normal; no masses,  no organomegaly Extremities: Severe lymphedema. Her right leg has an odor and laterally has an area that is draining  Lab Results:  Results for orders placed or performed during the hospital encounter of 11/13/15 (from the past 48 hour(s))  Glucose, capillary     Status: Abnormal   Collection Time: 11/14/15 11:37 AM  Result Value Ref Range   Glucose-Capillary 193 (H) 65 - 99 mg/dL   Comment 1 Notify RN    Comment 2 Document in Chart   Basic metabolic panel     Status: Abnormal   Collection Time: 11/14/15 11:51 AM  Result Value Ref Range   Sodium 137 135 - 145 mmol/L   Potassium 6.7 (HH) 3.5 - 5.1 mmol/L    Comment: CRITICAL RESULT CALLED TO, READ BACK BY AND VERIFIED WITH: VAUGHN,J AT 12:25PM ON 11/14/15 BY FESTERMAN,C    Chloride 101 101 - 111 mmol/L   CO2 32 22 - 32 mmol/L   Glucose, Bld 201 (H) 65 - 99 mg/dL   BUN 35 (H) 6 - 20 mg/dL   Creatinine, Ser 2.30 (H) 0.44 - 1.00 mg/dL   Calcium 8.6 (L) 8.9 - 10.3 mg/dL   GFR calc non Af Amer 24 (L) >60 mL/min   GFR calc Af Amer 28 (L) >60 mL/min    Comment: (NOTE) The eGFR has been calculated using  the CKD EPI equation. This calculation has not been validated in all clinical situations. eGFR's persistently <60 mL/min signify possible Chronic Kidney Disease.    Anion gap 4 (L) 5 - 15  Glucose, capillary     Status: None   Collection Time: 11/14/15  6:59 PM  Result Value Ref Range   Glucose-Capillary 92 65 - 99 mg/dL   Comment 1 Notify RN    Comment 2 Document in Chart   Glucose, capillary     Status: Abnormal   Collection Time: 11/14/15  8:52 PM  Result Value Ref Range   Glucose-Capillary 60 (L) 65 - 99 mg/dL   Comment 1 Notify RN    Comment 2 Document in Chart   Glucose, capillary     Status: None   Collection Time: 11/14/15  9:41 PM  Result Value Ref Range   Glucose-Capillary 81 65 - 99 mg/dL  Urinalysis, Routine w reflex microscopic (not at [facility])     Status: Abnormal   Collection Time: 11/15/15  5:32 AM  Result Value Ref Range   Color, Urine BROWN (A) YELLOW    Comment: BIOCHEMICALS MAY BE AFFECTED BY COLOR   APPearance CLOUDY (A) CLEAR   Specific Gravity, Urine 1.020 1.005 - 1.030   pH 6.5 5.0 - 8.0   Glucose, UA  NEGATIVE NEGATIVE mg/dL   Hgb urine dipstick LARGE (A) NEGATIVE   Bilirubin Urine SMALL (A) NEGATIVE   Ketones, ur NEGATIVE NEGATIVE mg/dL   Protein, ur 100 (A) NEGATIVE mg/dL   Nitrite NEGATIVE NEGATIVE   Leukocytes, UA MODERATE (A) NEGATIVE  Urine microscopic-add on     Status: Abnormal   Collection Time: 11/15/15  5:32 AM  Result Value Ref Range   Squamous Epithelial / LPF 0-5 (A) NONE SEEN    Comment: Please note change in reference range.   WBC, UA 6-30 0 - 5 WBC/hpf    Comment: Please note change in reference range.   RBC / HPF TOO NUMEROUS TO COUNT 0 - 5 RBC/hpf    Comment: Please note change in reference range.   Bacteria, UA FEW (A) NONE SEEN    Comment: Please note change in reference range.  Basic metabolic panel     Status: Abnormal   Collection Time: 11/15/15  7:16 AM  Result Value Ref Range   Sodium 137 135 - 145 mmol/L   Potassium  6.6 (HH) 3.5 - 5.1 mmol/L    Comment: CRITICAL RESULT CALLED TO, READ BACK BY AND VERIFIED WITH: VON,J AT 4431 ON 11/15/2015 BY WOODS, M    Chloride 101 101 - 111 mmol/L   CO2 31 22 - 32 mmol/L   Glucose, Bld 55 (L) 65 - 99 mg/dL   BUN 39 (H) 6 - 20 mg/dL   Creatinine, Ser 3.05 (H) 0.44 - 1.00 mg/dL   Calcium 8.4 (L) 8.9 - 10.3 mg/dL   GFR calc non Af Amer 17 (L) >60 mL/min   GFR calc Af Amer 20 (L) >60 mL/min    Comment: (NOTE) The eGFR has been calculated using the CKD EPI equation. This calculation has not been validated in all clinical situations. eGFR's persistently <60 mL/min signify possible Chronic Kidney Disease.    Anion gap 5 5 - 15  Glucose, capillary     Status: Abnormal   Collection Time: 11/15/15  7:29 AM  Result Value Ref Range   Glucose-Capillary 49 (L) 65 - 99 mg/dL  Glucose, capillary     Status: Abnormal   Collection Time: 11/15/15  8:00 AM  Result Value Ref Range   Glucose-Capillary 52 (L) 65 - 99 mg/dL  Glucose, capillary     Status: None   Collection Time: 11/15/15 10:39 AM  Result Value Ref Range   Glucose-Capillary 82 65 - 99 mg/dL  Glucose, capillary     Status: None   Collection Time: 11/15/15  4:33 PM  Result Value Ref Range   Glucose-Capillary 71 65 - 99 mg/dL  Glucose, capillary     Status: None   Collection Time: 11/15/15  8:53 PM  Result Value Ref Range   Glucose-Capillary 67 65 - 99 mg/dL   Comment 1 Notify RN    Comment 2 Document in Chart   Basic metabolic panel     Status: Abnormal   Collection Time: 11/16/15  6:09 AM  Result Value Ref Range   Sodium 137 135 - 145 mmol/L   Potassium 5.7 (H) 3.5 - 5.1 mmol/L   Chloride 99 (L) 101 - 111 mmol/L   CO2 29 22 - 32 mmol/L   Glucose, Bld 66 65 - 99 mg/dL   BUN 45 (H) 6 - 20 mg/dL   Creatinine, Ser 3.52 (H) 0.44 - 1.00 mg/dL   Calcium 8.5 (L) 8.9 - 10.3 mg/dL   GFR calc non Af Amer 14 (L) >60  mL/min   GFR calc Af Amer 16 (L) >60 mL/min    Comment: (NOTE) The eGFR has been calculated  using the CKD EPI equation. This calculation has not been validated in all clinical situations. eGFR's persistently <60 mL/min signify possible Chronic Kidney Disease.    Anion gap 9 5 - 15  Glucose, capillary     Status: Abnormal   Collection Time: 11/16/15  7:33 AM  Result Value Ref Range   Glucose-Capillary 63 (L) 65 - 99 mg/dL    ABGS No results for input(s): PHART, PO2ART, TCO2, HCO3 in the last 72 hours.  Invalid input(s): PCO2 CULTURES Recent Results (from the past 240 hour(s))  Blood culture (routine x 2)     Status: None (Preliminary result)   Collection Time: 11/13/15  3:31 PM  Result Value Ref Range Status   Specimen Description BLOOD LEFT ARM  Final   Special Requests   Final    BOTTLES DRAWN AEROBIC AND ANAEROBIC AEB 10CC ANA6CC   Culture NO GROWTH 3 DAYS  Final   Report Status PENDING  Incomplete  Blood culture (routine x 2)     Status: None (Preliminary result)   Collection Time: 11/13/15  3:37 PM  Result Value Ref Range Status   Specimen Description BLOOD RIGHT ARM  Final   Special Requests   Final    BOTTLES DRAWN AEROBIC AND ANAEROBIC AEB 6CC ANA 4CC   Culture NO GROWTH 3 DAYS  Final   Report Status PENDING  Incomplete   Studies/Results: No results found.  Medications:  Prior to Admission:  Prescriptions prior to admission  Medication Sig Dispense Refill Last Dose  . ALPRAZolam (XANAX) 0.5 MG tablet Take 0.5 mg by mouth 3 (three) times daily as needed for anxiety or sleep.    11/12/2015 at Unknown time  . amLODipine (NORVASC) 10 MG tablet Take 10 mg by mouth daily.   11/12/2015 at Unknown time  . insulin NPH-insulin regular (NOVOLIN 70/30) (70-30) 100 UNIT/ML injection Inject 60 Units into the skin 2 (two) times daily.   11/12/2015 at Unknown time  . metoprolol (LOPRESSOR) 50 MG tablet Take 50 mg by mouth 2 (two) times daily.   11/12/2015 at Unknown time  . Oxycodone HCl 10 MG TABS Take 10 mg by mouth 4 (four) times daily as needed (pain).    Past Week  at Unknown time  . potassium chloride SA (K-DUR,KLOR-CON) 20 MEQ tablet Take 20 mEq by mouth 3 (three) times daily.   11/12/2015 at Unknown time  . torsemide (DEMADEX) 100 MG tablet Take 100 mg by mouth 2 (two) times daily.    11/12/2015 at Unknown time  . acetaminophen (TYLENOL) 325 MG tablet Take 650 mg by mouth every 6 (six) hours as needed. fever   unknown  . diphenhydrAMINE (BENADRYL) 25 mg capsule Take 25 mg by mouth every 6 (six) hours as needed for allergies.   unknown  . docusate sodium (COLACE) 100 MG capsule Take 100 mg by mouth 2 (two) times daily as needed. For constipation   unknown  . HYDROcodone-acetaminophen (NORCO/VICODIN) 5-325 MG per tablet Take 2 tablets by mouth every 4 (four) hours as needed. (Patient not taking: Reported on 12/02/2014) 10 tablet 0   . oseltamivir (TAMIFLU) 75 MG capsule Take 1 capsule (75 mg total) by mouth every 12 (twelve) hours. (Patient not taking: Reported on 12/02/2014) 10 capsule 0   . oxyCODONE-acetaminophen (PERCOCET/ROXICET) 5-325 MG per tablet Take 1 tablet by mouth every 6 (six) hours as needed  for severe pain. 15 tablet 0 unknown   Scheduled: . clindamycin (CLEOCIN) IV  600 mg Intravenous 4 times per day  . enoxaparin (LOVENOX) injection  100 mg Subcutaneous Q24H  . furosemide  200 mg Intravenous BID  . insulin aspart protamine- aspart  60 Units Subcutaneous BID WC  . metolazone  5 mg Oral BID  . metoprolol  50 mg Oral BID  . nystatin   Topical BID  . sodium chloride  3 mL Intravenous Q12H  . sodium chloride  3 mL Intravenous Q12H   Continuous: . sodium chloride 125 mL/hr at 11/16/15 0931   ALC:MIMJII chloride, acetaminophen **OR** acetaminophen, acetaminophen, ALPRAZolam, diphenhydrAMINE, ondansetron (ZOFRAN) IV, oxyCODONE, sodium chloride  Assesment: She has cellulitis of her right leg. This is associated with her chronic lymphedema. She has morbid obesity which is unchanged. She has kidney disease and she still has an elevated  potassium level so that's going to be treated again. She has chronic hypoxic respiratory failure and chronic diastolic heart failure. Principal Problem:   Cellulitis of leg without foot, right Active Problems:   Lymphedema of lower extremity   Diabetes mellitus (HCC)   Morbid obesity (HCC)   Hyperkalemia   Chronic diastolic CHF (congestive heart failure) (HCC)   Chronic respiratory failure with hypoxia (HCC)   CKD (chronic kidney disease), stage III   Anemia of chronic disease    Plan: Continue current IV antibiotics. Try warm compresses on her leg. Continue with treatments for renal failure including Kayexalate    LOS: 3 days   [name] L 11/16/2015, 10:38 AM

## 2015-11-16 NOTE — Progress Notes (Signed)
Subjective: Complains of some diarrhea otherwise feels better. She has some nausea but no vomiting. Presently denies any difficulty breathing.   Objective: Vital signs in last 24 hours: Temp:  [97.6 F (36.4 C)-98.3 F (36.8 C)] 98.3 F (36.8 C) (11/27 0526) Pulse Rate:  [56-66] 66 (11/27 0526) Resp:  [18] 18 (11/27 0526) BP: (98-136)/(53-89) 125/89 mmHg (11/27 0526) SpO2:  [92 %-97 %] 96 % (11/27 0526) Weight:  [452 lb (205.026 kg)] 452 lb (205.026 kg) (11/27 0526)  Intake/Output from previous day: 11/26 0701 - 11/27 0700 In: 524 [P.O.:240; I.V.:284] Out: 200 [Urine:200] Intake/Output this shift:     Recent Labs  11/13/15 1500  HGB 10.6*    Recent Labs  11/13/15 1500  WBC 7.2  RBC 3.77*  HCT 33.5*  PLT 235    Recent Labs  11/15/15 0716 11/16/15 0609  NA 137 137  K 6.6* 5.7*  CL 101 99*  CO2 31 29  BUN 39* 45*  CREATININE 3.05* 3.52*  GLUCOSE 55* 66  CALCIUM 8.4* 8.5*   No results for input(s): LABPT, INR in the last 72 hours.  Generally patient is alert and in no apparent distress Chest decreased breath sound. Heart exam regular rate and rhythm Abdomen obese Extremities patient with chronic lymphedema   Assessment/Plan:  Problem #1 acute kidney injury superimposed on chronic. Presently her BUN and creatinine continued to increase. Patient is still oliguric. Problem #2 hyperkalemia: Her potassium 5.7 high but improving Problem #3 diabetes Problem #4 morbid obesity Problem #5 chronic renal failure: Stage III Problem #6 anemia Problem #7 cellulitis of her left foot. Plan:1] We'll increase IV fluid to 1 25 mL per hour 2] will add metolazone 5 mg by mouth twice a day 3] we will give her 30 g of kayexalate one dose 4]We'll check her basic metabolic panel in the morning    Ciin Brazzel S 11/16/2015, 8:13 AM

## 2015-11-17 ENCOUNTER — Inpatient Hospital Stay (HOSPITAL_COMMUNITY): Payer: Medicaid Other

## 2015-11-17 LAB — BASIC METABOLIC PANEL
Anion gap: 7 (ref 5–15)
BUN: 54 mg/dL — AB (ref 6–20)
CHLORIDE: 99 mmol/L — AB (ref 101–111)
CO2: 28 mmol/L (ref 22–32)
Calcium: 7.8 mg/dL — ABNORMAL LOW (ref 8.9–10.3)
Creatinine, Ser: 4.13 mg/dL — ABNORMAL HIGH (ref 0.44–1.00)
GFR calc Af Amer: 14 mL/min — ABNORMAL LOW (ref >60)
GFR calc non Af Amer: 12 mL/min — ABNORMAL LOW (ref >60)
GLUCOSE: 101 mg/dL — AB (ref 65–99)
POTASSIUM: 6.3 mmol/L — AB (ref 3.5–5.1)
SODIUM: 134 mmol/L — AB (ref 135–145)

## 2015-11-17 LAB — CBC
HCT: 35.1 % — ABNORMAL LOW (ref 36.0–46.0)
Hemoglobin: 11.1 g/dL — ABNORMAL LOW (ref 12.0–15.0)
MCH: 28.5 pg (ref 26.0–34.0)
MCHC: 31.6 g/dL (ref 30.0–36.0)
MCV: 90 fL (ref 78.0–100.0)
PLATELETS: 262 10*3/uL (ref 150–400)
RBC: 3.9 MIL/uL (ref 3.87–5.11)
RDW: 17.6 % — AB (ref 11.5–15.5)
WBC: 10.3 10*3/uL (ref 4.0–10.5)

## 2015-11-17 LAB — ALT: ALT: 19 U/L (ref 14–54)

## 2015-11-17 LAB — GLUCOSE, CAPILLARY
GLUCOSE-CAPILLARY: 94 mg/dL (ref 65–99)
Glucose-Capillary: 95 mg/dL (ref 65–99)
Glucose-Capillary: 95 mg/dL (ref 65–99)

## 2015-11-17 MED ORDER — HEPARIN SODIUM (PORCINE) 1000 UNIT/ML IJ SOLN
INTRAMUSCULAR | Status: AC
  Administered 2015-11-17: 3200 [IU] via INTRAVENOUS_CENTRAL
  Filled 2015-11-17: qty 6

## 2015-11-17 MED ORDER — SODIUM CHLORIDE 0.9 % IJ SOLN
10.0000 mL | INTRAMUSCULAR | Status: DC | PRN
  Administered 2015-11-17 – 2015-12-04 (×5): 10 mL via INTRAVENOUS
  Filled 2015-11-17 (×5): qty 10

## 2015-11-17 MED ORDER — SODIUM POLYSTYRENE SULFONATE 15 GM/60ML PO SUSP: 60.0000 g | ORAL | Status: DC

## 2015-11-17 MED ORDER — ALTEPLASE 2 MG IJ SOLR: 2.0000 mg | Freq: Once | INTRAMUSCULAR | Status: DC | PRN

## 2015-11-17 MED ORDER — SODIUM CHLORIDE 0.9 % IV SOLN: 100.0000 mL | INTRAVENOUS | Status: DC | PRN

## 2015-11-17 MED ORDER — TUBERCULIN PPD 5 UNIT/0.1ML ID SOLN
5.0000 [IU] | Freq: Once | INTRADERMAL | Status: AC
  Administered 2015-11-17: 5 [IU] via INTRADERMAL
  Filled 2015-11-17: qty 0.1

## 2015-11-17 MED ORDER — HEPARIN SODIUM (PORCINE) 1000 UNIT/ML DIALYSIS
1000.0000 [IU] | INTRAMUSCULAR | Status: DC | PRN
Start: 2015-11-17 — End: 2015-11-18
  Administered 2015-11-17 – 2015-11-18 (×2): 3200 [IU] via INTRAVENOUS_CENTRAL

## 2015-11-17 MED ORDER — SODIUM CHLORIDE 0.9 % IJ SOLN
INTRAMUSCULAR | Status: AC
  Administered 2015-11-17: 10 mL via INTRAVENOUS
  Filled 2015-11-17: qty 12

## 2015-11-17 NOTE — Progress Notes (Signed)
Subjective: Complains of nausea and vomiting. Presently she's not been able to keep anything down. Patient however denies any difficulty in breathing.  Objective: Vital signs in last 24 hours: Temp:  [97.5 F (36.4 C)-98.5 F (36.9 C)] 97.5 F (36.4 C) (11/28 0427) Pulse Rate:  [57-72] 72 (11/28 0820) Resp:  [18-20] 20 (11/28 0427) BP: (119-130)/(65-81) 126/81 mmHg (11/28 0427) SpO2:  [93 %-97 %] 93 % (11/28 0427) Weight:  [461 lb (209.108 kg)] 461 lb (209.108 kg) (11/28 0427)  Intake/Output from previous day: 11/27 0701 - 11/28 0700 In: 1620 [P.O.:120; I.V.:1500] Out: 325 [Urine:325] Intake/Output this shift:    No results for input(s): HGB in the last 72 hours. No results for input(s): WBC, RBC, HCT, PLT in the last 72 hours.  Recent Labs  11/16/15 0609 11/17/15 0551  NA 137 134*  K 5.7* 6.3*  CL 99* 99*  CO2 29 28  BUN 45* 54*  CREATININE 3.52* 4.13*  GLUCOSE 66 101*  CALCIUM 8.5* 7.8*   No results for input(s): LABPT, INR in the last 72 hours.  Generally patient is alert and in no apparent distress Chest decreased breath sound. Heart exam regular rate and rhythm Abdomen obese Extremities patient with chronic lymphedema   Assessment/Plan:  Problem #1 acute kidney injury superimposed on chronic. Patient remains oliguric despite of high-dose of Lasix and metolazone. She still has some nausea and vomiting. Her BUN and creatinine continued to increase. Problem #2 hyperkalemia: Her potassium 6.3. Her potassium continued to increase. Patient unable to take kayexalat orally. Problem #3 diabetes: Patient has episode of hypoglycemia but asymptomatic. Problem #4 morbid obesity Problem #5 chronic renal failure: Stage III Problem #6 anemia Problem #7 cellulitis of her left foot. I discussed with the patient about the problem of dialysis. Since patient is very obese and immobile A to be very hard to get outpatient dialysis if she continued to need one. However patient once  to try dialysis and think about it. Plan:1] We'll consider dialysis if we are able to get a femoral catheter placed. 2] we will give her 60 g of kayexalate rectally 3 doses. 3]We'll check her basic metabolic panel in the morning    [facility] S 11/17/2015, 8:56 AM

## 2015-11-17 NOTE — Procedures (Signed)
Central Venous Dialysis Catheter Insertion Procedure Note [name] [id number] May 23, 1966  Procedure: Insertion of Central Venous Catheter Indications: acute renal failure  Procedure Details Consent: Risks of procedure as well as the alternatives and risks of each were explained to the (patient/caregiver).  Consent for procedure obtained. Time Out: Verified patient identification, verified procedure, site/side was marked, verified correct patient position, special equipment/implants available, medications/allergies/relevent history reviewed, required imaging and test results available.  Performed  Maximum sterile technique was used including antiseptics, cap, gloves, gown, hand hygiene, mask and sheet. Skin prep: Chlorhexidine; local anesthetic administered A antimicrobial bonded/coated double lumen catheter was placed in the right femoral vein due to patient being a dialysis patient using the Seldinger technique.  Evaluation Blood flow good Complications: No apparent complications Patient did tolerate procedure well.   [name] A 11/17/2015, 12:40 PM

## 2015-11-17 NOTE — Progress Notes (Signed)
Late entry 11/17/2015 at about 1400:    Notified Dr. [name] that I had not started the Kayexalate due the patient being prepared for acute dialysis treatment.  Voiced to him that a dialysis/central line catheter had to be placed and that the patient was currently having the treatment.  Dr. [name] stated that if she was having dialysis she would not need the Kayexalate.  So I d/c'd the order.

## 2015-11-17 NOTE — Procedures (Signed)
HEMODIALYSIS TREATMENT NOTE:  2 hour first-ever hemodialysis treatment completed via newly placed right femoral temporary catheter. Goal met: 1.4 liters removed without interruption in ultrafiltration. All blood was reinfused. Pt hemodynamically stable throughout HD session with SBP ranging 120-150.  Report called to Sharen Hones, RN.  Rockwell Alexandria, RN, CDN

## 2015-11-17 NOTE — Progress Notes (Signed)
Subjective: She is awake and alert but having a lot of nausea now. She did not take Kayexalate yesterday so her potassium level is still elevated. She has no other new complaints.  Objective: Vital signs in last 24 hours: Temp:  [97.5 F (36.4 C)-98.5 F (36.9 C)] 97.5 F (36.4 C) (11/28 0427) Pulse Rate:  [57-72] 72 (11/28 0820) Resp:  [18-20] 20 (11/28 0427) BP: (119-130)/(65-81) 126/81 mmHg (11/28 0427) SpO2:  [93 %-97 %] 93 % (11/28 0427) Weight:  [209.108 kg (461 lb)] 209.108 kg (461 lb) (11/28 0427) Weight change: 4.082 kg (9 lb) Last BM Date: 11/15/15  Intake/Output from previous day: 11/27 0701 - 11/28 0700 In: 1620 [P.O.:120; I.V.:1500] Out: 325 [Urine:325]  PHYSICAL EXAM General appearance: alert, cooperative, moderate distress and morbidly obese Resp: clear to auscultation bilaterally Cardio: regular rate and rhythm, S1, S2 normal, no murmur, click, rub or gallop GI: soft, non-tender; bowel sounds normal; no masses,  no organomegaly Extremities: She still has lymphedema of her leg and still has inflammatory changes laterally  Lab Results:  Results for orders placed or performed during the hospital encounter of 11/13/15 (from the past 48 hour(s))  Glucose, capillary     Status: None   Collection Time: 11/15/15 10:39 AM  Result Value Ref Range   Glucose-Capillary 82 65 - 99 mg/dL  Glucose, capillary     Status: None   Collection Time: 11/15/15  4:33 PM  Result Value Ref Range   Glucose-Capillary 71 65 - 99 mg/dL  Glucose, capillary     Status: None   Collection Time: 11/15/15  8:53 PM  Result Value Ref Range   Glucose-Capillary 67 65 - 99 mg/dL   Comment 1 Notify RN    Comment 2 Document in Chart   Basic metabolic panel     Status: Abnormal   Collection Time: 11/16/15  6:09 AM  Result Value Ref Range   Sodium 137 135 - 145 mmol/L   Potassium 5.7 (H) 3.5 - 5.1 mmol/L   Chloride 99 (L) 101 - 111 mmol/L   CO2 29 22 - 32 mmol/L   Glucose, Bld 66 65 - 99 mg/dL    BUN 45 (H) 6 - 20 mg/dL   Creatinine, Ser 3.52 (H) 0.44 - 1.00 mg/dL   Calcium 8.5 (L) 8.9 - 10.3 mg/dL   GFR calc non Af Amer 14 (L) >60 mL/min   GFR calc Af Amer 16 (L) >60 mL/min    Comment: (NOTE) The eGFR has been calculated using the CKD EPI equation. This calculation has not been validated in all clinical situations. eGFR's persistently <60 mL/min signify possible Chronic Kidney Disease.    Anion gap 9 5 - 15  Glucose, capillary     Status: Abnormal   Collection Time: 11/16/15  7:33 AM  Result Value Ref Range   Glucose-Capillary 63 (L) 65 - 99 mg/dL  Glucose, capillary     Status: None   Collection Time: 11/16/15 11:41 AM  Result Value Ref Range   Glucose-Capillary 88 65 - 99 mg/dL  Glucose, capillary     Status: None   Collection Time: 11/16/15  4:45 PM  Result Value Ref Range   Glucose-Capillary 96 65 - 99 mg/dL  Glucose, capillary     Status: None   Collection Time: 11/16/15  8:45 PM  Result Value Ref Range   Glucose-Capillary 92 65 - 99 mg/dL   Comment 1 Notify RN    Comment 2 Document in Chart   Basic metabolic  panel     Status: Abnormal   Collection Time: 11/17/15  5:51 AM  Result Value Ref Range   Sodium 134 (L) 135 - 145 mmol/L   Potassium 6.3 (HH) 3.5 - 5.1 mmol/L    Comment: CRITICAL RESULT CALLED TO, READ BACK BY AND VERIFIED WITH: WATKINS,T AT 7:40AM ON 11/17/15 BY FESTERMAN,C    Chloride 99 (L) 101 - 111 mmol/L   CO2 28 22 - 32 mmol/L   Glucose, Bld 101 (H) 65 - 99 mg/dL   BUN 54 (H) 6 - 20 mg/dL   Creatinine, Ser 4.13 (H) 0.44 - 1.00 mg/dL   Calcium 7.8 (L) 8.9 - 10.3 mg/dL   GFR calc non Af Amer 12 (L) >60 mL/min   GFR calc Af Amer 14 (L) >60 mL/min    Comment: (NOTE) The eGFR has been calculated using the CKD EPI equation. This calculation has not been validated in all clinical situations. eGFR's persistently <60 mL/min signify possible Chronic Kidney Disease.    Anion gap 7 5 - 15  Glucose, capillary     Status: None   Collection Time:  11/17/15  7:48 AM  Result Value Ref Range   Glucose-Capillary 95 65 - 99 mg/dL   Comment 1 Notify RN     ABGS No results for input(s): PHART, PO2ART, TCO2, HCO3 in the last 72 hours.  Invalid input(s): PCO2 CULTURES Recent Results (from the past 240 hour(s))  Blood culture (routine x 2)     Status: None (Preliminary result)   Collection Time: 11/13/15  3:31 PM  Result Value Ref Range Status   Specimen Description BLOOD LEFT ARM  Final   Special Requests   Final    BOTTLES DRAWN AEROBIC AND ANAEROBIC AEB 10CC ANA6CC   Culture NO GROWTH 3 DAYS  Final   Report Status PENDING  Incomplete  Blood culture (routine x 2)     Status: None (Preliminary result)   Collection Time: 11/13/15  3:37 PM  Result Value Ref Range Status   Specimen Description BLOOD RIGHT ARM  Final   Special Requests   Final    BOTTLES DRAWN AEROBIC AND ANAEROBIC AEB 6CC ANA 4CC   Culture NO GROWTH 3 DAYS  Final   Report Status PENDING  Incomplete   Studies/Results: No results found.  Medications:  Prior to Admission:  Prescriptions prior to admission  Medication Sig Dispense Refill Last Dose  . ALPRAZolam (XANAX) 0.5 MG tablet Take 0.5 mg by mouth 3 (three) times daily as needed for anxiety or sleep.    11/12/2015 at Unknown time  . amLODipine (NORVASC) 10 MG tablet Take 10 mg by mouth daily.   11/12/2015 at Unknown time  . insulin NPH-insulin regular (NOVOLIN 70/30) (70-30) 100 UNIT/ML injection Inject 60 Units into the skin 2 (two) times daily.   11/12/2015 at Unknown time  . metoprolol (LOPRESSOR) 50 MG tablet Take 50 mg by mouth 2 (two) times daily.   11/12/2015 at Unknown time  . Oxycodone HCl 10 MG TABS Take 10 mg by mouth 4 (four) times daily as needed (pain).    Past Week at Unknown time  . potassium chloride SA (K-DUR,KLOR-CON) 20 MEQ tablet Take 20 mEq by mouth 3 (three) times daily.   11/12/2015 at Unknown time  . torsemide (DEMADEX) 100 MG tablet Take 100 mg by mouth 2 (two) times daily.     11/12/2015 at Unknown time  . acetaminophen (TYLENOL) 325 MG tablet Take 650 mg by mouth every 6 (six)  hours as needed. fever   unknown  . diphenhydrAMINE (BENADRYL) 25 mg capsule Take 25 mg by mouth every 6 (six) hours as needed for allergies.   unknown  . docusate sodium (COLACE) 100 MG capsule Take 100 mg by mouth 2 (two) times daily as needed. For constipation   unknown  . HYDROcodone-acetaminophen (NORCO/VICODIN) 5-325 MG per tablet Take 2 tablets by mouth every 4 (four) hours as needed. (Patient not taking: Reported on 12/02/2014) 10 tablet 0   . oseltamivir (TAMIFLU) 75 MG capsule Take 1 capsule (75 mg total) by mouth every 12 (twelve) hours. (Patient not taking: Reported on 12/02/2014) 10 capsule 0   . oxyCODONE-acetaminophen (PERCOCET/ROXICET) 5-325 MG per tablet Take 1 tablet by mouth every 6 (six) hours as needed for severe pain. 15 tablet 0 unknown   Scheduled: . clindamycin (CLEOCIN) IV  600 mg Intravenous 4 times per day  . enoxaparin (LOVENOX) injection  100 mg Subcutaneous Q24H  . furosemide  200 mg Intravenous BID  . insulin aspart protamine- aspart  60 Units Subcutaneous BID WC  . metolazone  5 mg Oral BID  . metoprolol  50 mg Oral BID  . nystatin   Topical BID  . sodium chloride  3 mL Intravenous Q12H  . sodium chloride  3 mL Intravenous Q12H   Continuous: . sodium chloride 125 mL/hr at 11/17/15 0511   KJZ:PHXTAV chloride, acetaminophen **OR** acetaminophen, acetaminophen, ALPRAZolam, diphenhydrAMINE, ondansetron (ZOFRAN) IV, oxyCODONE, sodium chloride  Assesment: She was admitted with cellulitis of her leg. She has acute on chronic renal failure. Her potassium is still elevated but she did not take her Kayexalate yesterday. This morning she is vomiting so I don't think we can give her Kayexalate. She is not making urine so she may have to have dialysis. Principal Problem:   Cellulitis of leg without foot, right Active Problems:   Lymphedema of lower extremity    Diabetes mellitus (HCC)   Morbid obesity (HCC)   Hyperkalemia   Chronic diastolic CHF (congestive heart failure) (HCC)   Chronic respiratory failure with hypoxia (HCC)   CKD (chronic kidney disease), stage III   Anemia of chronic disease    Plan: Continue with current treatments continue medications    LOS: 4 days   Scott Vanderveer L 11/17/2015, 8:52 AM

## 2015-11-18 LAB — BLOOD GAS, ARTERIAL
Acid-Base Excess: 0.3 mmol/L (ref 0.0–2.0)
Bicarbonate: 22.8 mEq/L (ref 20.0–24.0)
DRAWN BY: 25788
O2 SAT: 89.6 %
PCO2 ART: 79.7 mmHg — AB (ref 35.0–45.0)
PO2 ART: 73.3 mmHg — AB (ref 80.0–100.0)
pH, Arterial: 7.172 — CL (ref 7.350–7.450)

## 2015-11-18 LAB — CBC
HCT: 33.8 % — ABNORMAL LOW (ref 36.0–46.0)
HEMOGLOBIN: 10.4 g/dL — AB (ref 12.0–15.0)
MCH: 27.8 pg (ref 26.0–34.0)
MCHC: 30.8 g/dL (ref 30.0–36.0)
MCV: 90.4 fL (ref 78.0–100.0)
Platelets: 269 10*3/uL (ref 150–400)
RBC: 3.74 MIL/uL — AB (ref 3.87–5.11)
RDW: 17.7 % — ABNORMAL HIGH (ref 11.5–15.5)
WBC: 9 10*3/uL (ref 4.0–10.5)

## 2015-11-18 LAB — BASIC METABOLIC PANEL
ANION GAP: 9 (ref 5–15)
BUN: 53 mg/dL — ABNORMAL HIGH (ref 6–20)
CALCIUM: 7.9 mg/dL — AB (ref 8.9–10.3)
CHLORIDE: 97 mmol/L — AB (ref 101–111)
CO2: 28 mmol/L (ref 22–32)
CREATININE: 4.32 mg/dL — AB (ref 0.44–1.00)
GFR calc non Af Amer: 11 mL/min — ABNORMAL LOW (ref >60)
GFR, EST AFRICAN AMERICAN: 13 mL/min — AB (ref >60)
GLUCOSE: 104 mg/dL — AB (ref 65–99)
Potassium: 5.8 mmol/L — ABNORMAL HIGH (ref 3.5–5.1)
Sodium: 134 mmol/L — ABNORMAL LOW (ref 135–145)

## 2015-11-18 LAB — CULTURE, BLOOD (ROUTINE X 2)
CULTURE: NO GROWTH
CULTURE: NO GROWTH

## 2015-11-18 LAB — GLUCOSE, CAPILLARY
GLUCOSE-CAPILLARY: 91 mg/dL (ref 65–99)
GLUCOSE-CAPILLARY: 82 mg/dL (ref 65–99)
GLUCOSE-CAPILLARY: 86 mg/dL (ref 65–99)

## 2015-11-18 LAB — HEPATITIS B SURFACE ANTIGEN: Hepatitis B Surface Ag: NEGATIVE

## 2015-11-18 LAB — HEPATITIS C ANTIBODY

## 2015-11-18 LAB — HEPATITIS B SURFACE ANTIBODY,QUALITATIVE: HEP B S AB: NONREACTIVE

## 2015-11-18 LAB — MRSA PCR SCREENING: MRSA by PCR: NEGATIVE

## 2015-11-18 LAB — HEPATITIS B CORE ANTIBODY, TOTAL: Hep B Core Total Ab: NEGATIVE

## 2015-11-18 MED ORDER — SODIUM CHLORIDE 0.9 % IV SOLN: 100.0000 mL | INTRAVENOUS | Status: DC | PRN

## 2015-11-18 MED ORDER — ALTEPLASE 2 MG IJ SOLR: 2.0000 mg | Freq: Once | INTRAMUSCULAR | Status: DC | PRN

## 2015-11-18 MED ORDER — LIDOCAINE-PRILOCAINE 2.5-2.5 % EX CREA: 1.0000 "application " | TOPICAL_CREAM | CUTANEOUS | Status: DC | PRN

## 2015-11-18 MED ORDER — HEPARIN SODIUM (PORCINE) 1000 UNIT/ML IJ SOLN
INTRAMUSCULAR | Status: AC
  Administered 2015-11-18: 3200 [IU] via INTRAVENOUS_CENTRAL
  Filled 2015-11-18: qty 7

## 2015-11-18 MED ORDER — ENOXAPARIN SODIUM 30 MG/0.3ML ~~LOC~~ SOLN
30.0000 mg | SUBCUTANEOUS | Status: DC
  Administered 2015-11-18 – 2015-11-26 (×9): 30 mg via SUBCUTANEOUS
  Filled 2015-11-18 (×9): qty 0.3

## 2015-11-18 MED ORDER — LIDOCAINE HCL (PF) 1 % IJ SOLN: 5.0000 mL | INTRAMUSCULAR | Status: DC | PRN

## 2015-11-18 MED ORDER — PENTAFLUOROPROP-TETRAFLUOROETH EX AERO: 1.0000 "application " | INHALATION_SPRAY | CUTANEOUS | Status: DC | PRN

## 2015-11-18 NOTE — Progress Notes (Signed)
Patient transferred to ICU bed 6 by RN, two techs, and Respiratory therapist. Patient stable, able to travel on venturi face mask. Patient's family members at the bedside during transportation. Report given at the ICU bedside to [facility], [place]

## 2015-11-18 NOTE — Procedures (Signed)
   HEMODIALYSIS TREATMENT NOTE:  4 hour HD completed via right femoral temporary catheter. Goal met: 3.6 liters removed; no interruption in UF. All blood reinfused. Report given to L. Hylton, RN.  Rockwell Alexandria, RN, CDN

## 2015-11-18 NOTE — Progress Notes (Addendum)
Subjective: Nausea and vomiting is better and denies any difficulty in breathijng  Objective: Vital signs in last 24 hours: Temp:  [97.5 F (36.4 C)-97.7 F (36.5 C)] 97.5 F (36.4 C) (11/29 0548) Pulse Rate:  [51-59] 54 (11/29 0548) Resp:  [16-20] 20 (11/29 0548) BP: (112-158)/(60-85) 112/60 mmHg (11/29 0548) SpO2:  [92 %-94 %] 92 % (11/29 0548) Weight:  [460 lb 12.2 oz (209 kg)-468 lb (212.283 kg)] 468 lb (212.283 kg) (11/29 0548)  Intake/Output from previous day: 11/28 0701 - 11/29 0700 In: -  Out: 3704 [Urine:150] Intake/Output this shift:     Recent Labs  11/17/15 1555 11/18/15 0613  HGB 11.1* 10.4*    Recent Labs  11/17/15 1555 11/18/15 0613  WBC 10.3 9.0  RBC 3.90 3.74*  HCT 35.1* 33.8*  PLT 262 269    Recent Labs  11/17/15 0551 11/18/15 0613  NA 134* 134*  K 6.3* 5.8*  CL 99* 97*  CO2 28 28  BUN 54* 53*  CREATININE 4.13* 4.32*  GLUCOSE 101* 104*  CALCIUM 7.8* 7.9*   No results for input([name]): LABPT, INR in the last 72 hours.  Generally patient is alert and in no apparent distress Chest decreased breath sound. Heart exam regular rate and rhythm Abdomen obese Extremities patient with chronic lymphedema   Assessment/Plan:  Problem #1 acute kidney injury superimposed on chronic. Patient remains oliguric . Her nausea and vomiting has improved. Patient has a short dialysis yesterday. roblem #2 hyperkalemia: Her potassium 5.8 . Her potassium has improved but still high Problem #3 diabetes: Patient has episode of hypoglycemia but asymptomatic. Problem #4 morbid obesity Problem #5 chronic renal failure: Stage III Problem #6 anemia: Her hemoglobin is still with with in the suggested amount Problem #7 cellulitis of her left foot. Plan:1] We'll dialyze patient today for 4 hours 2] we will use 2 k bath  3]We'll check her basic metabolic panel in the morning 4] We will remove 3 liters if sbp >90    [name] 11/18/2015, 8:45 AM

## 2015-11-18 NOTE — Progress Notes (Addendum)
Subjective: This morning she is a little bit confused and sluggish. I think she probably has sleep apnea at baseline but has not had C Pap at home she reports. She did do dialysis yesterday and says she's not having any nausea now. She has no other new complaints  Objective: Vital signs in last 24 hours: Temp:  [97.5 F (36.4 C)-97.7 F (36.5 C)] 97.5 F (36.4 C) (11/29 0548) Pulse Rate:  [51-59] 54 (11/29 0548) Resp:  [16-20] 20 (11/29 0548) BP: (112-158)/(60-85) 112/60 mmHg (11/29 0548) SpO2:  [92 %-94 %] 92 % (11/29 0548) Weight:  [209 kg (460 lb 12.2 oz)-212.283 kg (468 lb)] 212.283 kg (468 lb) (11/29 0548) Weight change: -0.108 kg (-3.8 oz) Last BM Date: 11/17/15  Intake/Output from previous day: 11/28 0701 - 11/29 0700 In: -  Out: 1533 [Urine:150]  PHYSICAL EXAM General appearance: alert and Confused morbidly obese Resp: rhonchi bilaterally Cardio: regular rate and rhythm, S1, S2 normal, no murmur, click, rub or gallop GI: soft, non-tender; bowel sounds normal; no masses,  no organomegaly Extremities: She has significant lymphedema of both legs. Her right leg looks a little bit better with treatment for cellulitis.  Lab Results:  Results for orders placed or performed during the hospital encounter of 11/13/15 (from the past 48 hour(s))  Glucose, capillary     Status: None   Collection Time: 11/16/15 11:41 AM  Result Value Ref Range   Glucose-Capillary 88 65 - 99 mg/dL  Glucose, capillary     Status: None   Collection Time: 11/16/15  4:45 PM  Result Value Ref Range   Glucose-Capillary 96 65 - 99 mg/dL  Glucose, capillary     Status: None   Collection Time: 11/16/15  8:45 PM  Result Value Ref Range   Glucose-Capillary 92 65 - 99 mg/dL   Comment 1 Notify RN    Comment 2 Document in Chart   Basic metabolic panel     Status: Abnormal   Collection Time: 11/17/15  5:51 AM  Result Value Ref Range   Sodium 134 (L) 135 - 145 mmol/L   Potassium 6.3 (HH) 3.5 - 5.1 mmol/L     Comment: CRITICAL RESULT CALLED TO, READ BACK BY AND VERIFIED WITH: WATKINS,T AT 7:40AM ON 11/17/15 BY FESTERMAN,C    Chloride 99 (L) 101 - 111 mmol/L   CO2 28 22 - 32 mmol/L   Glucose, Bld 101 (H) 65 - 99 mg/dL   BUN 54 (H) 6 - 20 mg/dL   Creatinine, Ser 4.13 (H) 0.44 - 1.00 mg/dL   Calcium 7.8 (L) 8.9 - 10.3 mg/dL   GFR calc non Af Amer 12 (L) >60 mL/min   GFR calc Af Amer 14 (L) >60 mL/min    Comment: (NOTE) The eGFR has been calculated using the CKD EPI equation. This calculation has not been validated in all clinical situations. eGFR's persistently <60 mL/min signify possible Chronic Kidney Disease.    Anion gap 7 5 - 15  Glucose, capillary     Status: None   Collection Time: 11/17/15  7:48 AM  Result Value Ref Range   Glucose-Capillary 95 65 - 99 mg/dL   Comment 1 Notify RN   ALT     Status: None   Collection Time: 11/17/15  9:48 AM  Result Value Ref Range   ALT 19 14 - 54 U/L  Glucose, capillary     Status: None   Collection Time: 11/17/15 12:59 PM  Result Value Ref Range   Glucose-Capillary 95  65 - 99 mg/dL   Comment 1 Notify RN   Hepatitis B core antibody, total     Status: None   Collection Time: 11/17/15  3:55 PM  Result Value Ref Range   Hep B Core Total Ab Negative Negative    Comment: (NOTE) Performed At: [facility] [address] [place], [place] [id number] Lindon Romp MD JO:[id number]   CBC     Status: Abnormal   Collection Time: 11/17/15  3:55 PM  Result Value Ref Range   WBC 10.3 4.0 - 10.5 K/uL   RBC 3.90 3.87 - 5.11 MIL/uL   Hemoglobin 11.1 (L) 12.0 - 15.0 g/dL   HCT 35.1 (L) 36.0 - 46.0 %   MCV 90.0 78.0 - 100.0 fL   MCH 28.5 26.0 - 34.0 pg   MCHC 31.6 30.0 - 36.0 g/dL   RDW 17.6 (H) 11.5 - 15.5 %   Platelets 262 150 - 400 K/uL  Glucose, capillary     Status: None   Collection Time: 11/17/15  5:35 PM  Result Value Ref Range   Glucose-Capillary 94 65 - 99 mg/dL   Comment 1 Notify RN   Basic metabolic panel     Status:  Abnormal   Collection Time: 11/18/15  6:13 AM  Result Value Ref Range   Sodium 134 (L) 135 - 145 mmol/L   Potassium 5.8 (H) 3.5 - 5.1 mmol/L   Chloride 97 (L) 101 - 111 mmol/L   CO2 28 22 - 32 mmol/L   Glucose, Bld 104 (H) 65 - 99 mg/dL   BUN 53 (H) 6 - 20 mg/dL   Creatinine, Ser 4.32 (H) 0.44 - 1.00 mg/dL   Calcium 7.9 (L) 8.9 - 10.3 mg/dL   GFR calc non Af Amer 11 (L) >60 mL/min   GFR calc Af Amer 13 (L) >60 mL/min    Comment: (NOTE) The eGFR has been calculated using the CKD EPI equation. This calculation has not been validated in all clinical situations. eGFR's persistently <60 mL/min signify possible Chronic Kidney Disease.    Anion gap 9 5 - 15  CBC     Status: Abnormal   Collection Time: 11/18/15  6:13 AM  Result Value Ref Range   WBC 9.0 4.0 - 10.5 K/uL   RBC 3.74 (L) 3.87 - 5.11 MIL/uL   Hemoglobin 10.4 (L) 12.0 - 15.0 g/dL   HCT 33.8 (L) 36.0 - 46.0 %   MCV 90.4 78.0 - 100.0 fL   MCH 27.8 26.0 - 34.0 pg   MCHC 30.8 30.0 - 36.0 g/dL   RDW 17.7 (H) 11.5 - 15.5 %   Platelets 269 150 - 400 K/uL  Glucose, capillary     Status: None   Collection Time: 11/18/15  7:41 AM  Result Value Ref Range   Glucose-Capillary 91 65 - 99 mg/dL   Comment 1 Notify RN     ABGS No results for input(s): PHART, PO2ART, TCO2, HCO3 in the last 72 hours.  Invalid input(s): PCO2 CULTURES Recent Results (from the past 240 hour(s))  Blood culture (routine x 2)     Status: None (Preliminary result)   Collection Time: 11/13/15  3:31 PM  Result Value Ref Range Status   Specimen Description BLOOD LEFT ARM  Final   Special Requests   Final    BOTTLES DRAWN AEROBIC AND ANAEROBIC AEB 10CC ANA6CC   Culture NO GROWTH 4 DAYS  Final   Report Status PENDING  Incomplete  Blood culture (routine x  2)     Status: None (Preliminary result)   Collection Time: 11/13/15  3:37 PM  Result Value Ref Range Status   Specimen Description BLOOD RIGHT ARM  Final   Special Requests   Final    BOTTLES DRAWN  AEROBIC AND ANAEROBIC AEB 6CC ANA 4CC   Culture NO GROWTH 4 DAYS  Final   Report Status PENDING  Incomplete   Studies/Results: US Renal  11/17/2015  CLINICAL DATA:  49 year old female with chronic renal failure, beginning dialysis. Subsequent encounter. EXAM: RENAL / URINARY TRACT ULTRASOUND COMPLETE COMPARISON:  Noncontrast CT Abdomen and Pelvis 12/02/2014 FINDINGS: Large body habitus. Right Kidney: Length: 13.6 cm. Echogenicity within normal limits. No mass or hydronephrosis visualized. Left Kidney: Length: Could not be identified despite multiple attempts. Bladder: Decompressed via Foley catheter. IMPRESSION: Negative sonographic appearance of the right kidney. The left kidney could not be visualized despite multiple attempts. Electronically Signed   By: Genevie Ann M.D.   On: 11/17/2015 11:10    Medications:  Prior to Admission:  Prescriptions prior to admission  Medication Sig Dispense Refill Last Dose  . ALPRAZolam (XANAX) 0.5 MG tablet Take 0.5 mg by mouth 3 (three) times daily as needed for anxiety or sleep.    11/12/2015 at Unknown time  . amLODipine (NORVASC) 10 MG tablet Take 10 mg by mouth daily.   11/12/2015 at Unknown time  . insulin NPH-insulin regular (NOVOLIN 70/30) (70-30) 100 UNIT/ML injection Inject 60 Units into the skin 2 (two) times daily.   11/12/2015 at Unknown time  . metoprolol (LOPRESSOR) 50 MG tablet Take 50 mg by mouth 2 (two) times daily.   11/12/2015 at Unknown time  . Oxycodone HCl 10 MG TABS Take 10 mg by mouth 4 (four) times daily as needed (pain).    Past Week at Unknown time  . potassium chloride SA (K-DUR,KLOR-CON) 20 MEQ tablet Take 20 mEq by mouth 3 (three) times daily.   11/12/2015 at Unknown time  . torsemide (DEMADEX) 100 MG tablet Take 100 mg by mouth 2 (two) times daily.    11/12/2015 at Unknown time  . acetaminophen (TYLENOL) 325 MG tablet Take 650 mg by mouth every 6 (six) hours as needed. fever   unknown  . diphenhydrAMINE (BENADRYL) 25 mg capsule  Take 25 mg by mouth every 6 (six) hours as needed for allergies.   unknown  . docusate sodium (COLACE) 100 MG capsule Take 100 mg by mouth 2 (two) times daily as needed. For constipation   unknown  . HYDROcodone-acetaminophen (NORCO/VICODIN) 5-325 MG per tablet Take 2 tablets by mouth every 4 (four) hours as needed. (Patient not taking: Reported on 12/02/2014) 10 tablet 0   . oseltamivir (TAMIFLU) 75 MG capsule Take 1 capsule (75 mg total) by mouth every 12 (twelve) hours. (Patient not taking: Reported on 12/02/2014) 10 capsule 0   . oxyCODONE-acetaminophen (PERCOCET/ROXICET) 5-325 MG per tablet Take 1 tablet by mouth every 6 (six) hours as needed for severe pain. 15 tablet 0 unknown   Scheduled: . clindamycin (CLEOCIN) IV  600 mg Intravenous 4 times per day  . enoxaparin (LOVENOX) injection  100 mg Subcutaneous Q24H  . furosemide  200 mg Intravenous BID  . insulin aspart protamine- aspart  60 Units Subcutaneous BID WC  . metolazone  5 mg Oral BID  . metoprolol  50 mg Oral BID  . nystatin   Topical BID  . sodium chloride  3 mL Intravenous Q12H  . sodium chloride  3 mL Intravenous Q12H  .  tuberculin  5 Units Intradermal Once   Continuous: . sodium chloride 125 mL/hr at 11/17/15 2146   BTD:HRCBUL chloride, sodium chloride, sodium chloride, acetaminophen **OR** acetaminophen, acetaminophen, ALPRAZolam, alteplase, diphenhydrAMINE, heparin, ondansetron (ZOFRAN) IV, oxyCODONE, sodium chloride, sodium chloride, sodium chloride  Assesment: She was admitted with cellulitis of the leg. This is related to her chronic lymphedema. She has acute on chronic kidney disease and received dialysis yesterday and seems to be feeling a little bit better.  She is a little confused this morning which may be related to sleep apnea.  She has morbid obesity which is unchanged  She has obesity hypoventilation.  She has chronic diastolic heart failure and that appears to be stable Principal Problem:   Cellulitis  of leg without foot, right Active Problems:   Lymphedema of lower extremity   Diabetes mellitus (HCC)   Morbid obesity (HCC)   Hyperkalemia   Chronic diastolic CHF (congestive heart failure) (HCC)   Chronic respiratory failure with hypoxia (HCC)   CKD (chronic kidney disease), stage III   Anemia of chronic disease    Plan: Check arterial blood gas today. Continue other treatments. Blood gas severely abnormal. With respiratory failure renal failureetc she needs to go to ICU and start on bipap.    LOS: 5 days   Jeison Delpilar L 11/18/2015, 8:49 AM

## 2015-11-18 NOTE — Care Management Note (Signed)
Case Management Note  Patient Details  Name: MARLA POULIOT MRN: [id number] Date of Birth: Mar 17, 1966  Pt transferred to higher level of care, Will cont to follow for DC planning.   Expected Discharge Date:  11/18/15               Expected Discharge Plan:  Home/Self Care  In-House Referral:  Financial Counselor  Discharge planning Services  CM Consult  Post Acute Care Choice:  NA Choice offered to:  NA  DME Arranged:    DME Agency:     HH Arranged:    HH Agency:     Status of Service:  In process, will continue to follow  Medicare Important Message Given:    Date Medicare IM Given:    Medicare IM give by:    Date Additional Medicare IM Given:    Additional Medicare Important Message give by:     If discussed at [place] of Stay Meetings, dates discussed: 11/18/2015   Additional Comments:  Sherald Barge, RN 11/18/2015, 2:01 PM

## 2015-11-18 NOTE — Progress Notes (Signed)
CRITICAL VALUE ALERT  Critical value received:  Blood gas. Ph: 7.172    Co2: 79.7  Date of notification:  11/18/2015  Time of notification:  0940  Critical value read back:Yes.    Nurse who received alert:  Stann Ore   MD notified (1st page):  Dr. [name]  Time of first page:  [phone]  MD notified (2nd page):  Time of second page:  Responding MD:  Dr. [name]  Time MD responded:  Dr. [name]

## 2015-11-19 DIAGNOSIS — J9620 Acute and chronic respiratory failure, unspecified whether with hypoxia or hypercapnia: Secondary | ICD-10-CM | POA: Diagnosis not present

## 2015-11-19 LAB — BASIC METABOLIC PANEL
ANION GAP: 9 (ref 5–15)
BUN: 43 mg/dL — ABNORMAL HIGH (ref 6–20)
CHLORIDE: 96 mmol/L — AB (ref 101–111)
CO2: 29 mmol/L (ref 22–32)
Calcium: 8 mg/dL — ABNORMAL LOW (ref 8.9–10.3)
Creatinine, Ser: 3.87 mg/dL — ABNORMAL HIGH (ref 0.44–1.00)
GFR calc non Af Amer: 13 mL/min — ABNORMAL LOW (ref >60)
GFR, EST AFRICAN AMERICAN: 15 mL/min — AB (ref >60)
Glucose, Bld: 90 mg/dL (ref 65–99)
POTASSIUM: 5 mmol/L (ref 3.5–5.1)
Sodium: 134 mmol/L — ABNORMAL LOW (ref 135–145)

## 2015-11-19 LAB — CBC
HCT: 33.9 % — ABNORMAL LOW (ref 36.0–46.0)
HEMOGLOBIN: 10.7 g/dL — AB (ref 12.0–15.0)
MCH: 28.1 pg (ref 26.0–34.0)
MCHC: 31.6 g/dL (ref 30.0–36.0)
MCV: 89 fL (ref 78.0–100.0)
Platelets: 234 10*3/uL (ref 150–400)
RBC: 3.81 MIL/uL — AB (ref 3.87–5.11)
RDW: 17.5 % — ABNORMAL HIGH (ref 11.5–15.5)
WBC: 8.6 10*3/uL (ref 4.0–10.5)

## 2015-11-19 LAB — GLUCOSE, CAPILLARY
GLUCOSE-CAPILLARY: 76 mg/dL (ref 65–99)
GLUCOSE-CAPILLARY: 91 mg/dL (ref 65–99)
GLUCOSE-CAPILLARY: 81 mg/dL (ref 65–99)
Glucose-Capillary: 80 mg/dL (ref 65–99)

## 2015-11-19 LAB — PHOSPHORUS: PHOSPHORUS: 6.5 mg/dL — AB (ref 2.5–4.6)

## 2015-11-19 MED ORDER — GLUCERNA SHAKE PO LIQD: 237.0000 mL | Freq: Two times a day (BID) | ORAL | Status: DC

## 2015-11-19 MED ORDER — INSULIN ASPART 100 UNIT/ML ~~LOC~~ SOLN
0.0000 [IU] | Freq: Three times a day (TID) | SUBCUTANEOUS | Status: DC
  Administered 2015-11-22: 1 [IU] via SUBCUTANEOUS
  Administered 2015-11-24 – 2015-11-25 (×4): 3 [IU] via SUBCUTANEOUS
  Administered 2015-11-26 (×2): 4 [IU] via SUBCUTANEOUS
  Administered 2015-11-26: 7 [IU] via SUBCUTANEOUS
  Administered 2015-11-27 (×2): 4 [IU] via SUBCUTANEOUS
  Administered 2015-11-28 – 2015-12-01 (×4): 3 [IU] via SUBCUTANEOUS
  Administered 2015-12-01 – 2015-12-04 (×9): 4 [IU] via SUBCUTANEOUS
  Administered 2015-12-04: 3 [IU] via SUBCUTANEOUS
  Administered 2015-12-05: 7 [IU] via SUBCUTANEOUS
  Administered 2015-12-05: 4 [IU] via SUBCUTANEOUS

## 2015-11-19 MED ORDER — INSULIN ASPART 100 UNIT/ML ~~LOC~~ SOLN: 0.0000 [IU] | Freq: Every day | SUBCUTANEOUS | Status: DC

## 2015-11-19 NOTE — Progress Notes (Signed)
Subjective: She looks better this morning. She is on BiPAP and more alert. She has no new complaints. She was able to have dialysis yesterday and 3.6 L were removed. She is not having as much nausea. She still says she is short of breath.  Objective: Vital signs in last 24 hours: Temp:  [96.5 F (35.8 C)-98.1 F (36.7 C)] 98.1 F (36.7 C) (11/30 0830) Pulse Rate:  [45-58] 51 (11/30 0700) Resp:  [13-19] 15 (11/30 0700) BP: (109-146)/(61-89) 125/73 mmHg (11/30 0700) SpO2:  [93 %-100 %] 99 % (11/30 0700) FiO2 (%):  [40 %] 40 % (11/30 0400) Weight:  [200 kg (440 lb 14.7 oz)-212 kg (467 lb 6 oz)] 200 kg (440 lb 14.7 oz) (11/30 0400) Weight change: 3 kg (6 lb 9.8 oz) Last BM Date: 11/18/15  Intake/Output from previous day: 11/29 0701 - 11/30 0700 In: 1000 [P.O.:120; I.V.:100; IV Piggyback:780] Out: 3800 [Urine:200]  PHYSICAL EXAM General appearance: alert, cooperative, mild distress and morbidly obese Resp: Diminished breath sounds and prolonged expiratory phase bilaterally Cardio: regular rate and rhythm, S1, S2 normal, no murmur, click, rub or gallop GI: soft, non-tender; bowel sounds normal; no masses,  no organomegaly Extremities: Lymphedema essentially unchanged. The cellulitis of her leg looks better  Lab Results:  Results for orders placed or performed during the hospital encounter of 11/13/15 (from the past 48 hour(s))  ALT     Status: None   Collection Time: 11/17/15  9:48 AM  Result Value Ref Range   ALT 19 14 - 54 U/L  Glucose, capillary     Status: None   Collection Time: 11/17/15 12:59 PM  Result Value Ref Range   Glucose-Capillary 95 65 - 99 mg/dL   Comment 1 Notify RN   Hepatitis B core antibody, total     Status: None   Collection Time: 11/17/15  3:55 PM  Result Value Ref Range   Hep B Core Total Ab Negative Negative    Comment: (NOTE) Performed At: [facility] [address] [place], [place] [id number] [place] William F MD Ph:[phone]   CBC      Status: Abnormal   Collection Time: 11/17/15  3:55 PM  Result Value Ref Range   WBC 10.3 4.0 - 10.5 K/uL   RBC 3.90 3.87 - 5.11 MIL/uL   Hemoglobin 11.1 (L) 12.0 - 15.0 g/dL   HCT 35.1 (L) 36.0 - 46.0 %   MCV 90.0 78.0 - 100.0 fL   MCH 28.5 26.0 - 34.0 pg   MCHC 31.6 30.0 - 36.0 g/dL   RDW 17.6 (H) 11.5 - 15.5 %   Platelets 262 150 - 400 K/uL  Glucose, capillary     Status: None   Collection Time: 11/17/15  5:35 PM  Result Value Ref Range   Glucose-Capillary 94 65 - 99 mg/dL   Comment 1 Notify RN   Basic metabolic panel     Status: Abnormal   Collection Time: 11/18/15  6:13 AM  Result Value Ref Range   Sodium 134 (L) 135 - 145 mmol/L   Potassium 5.8 (H) 3.5 - 5.1 mmol/L   Chloride 97 (L) 101 - 111 mmol/L   CO2 28 22 - 32 mmol/L   Glucose, Bld 104 (H) 65 - 99 mg/dL   BUN 53 (H) 6 - 20 mg/dL   Creatinine, Ser 4.32 (H) 0.44 - 1.00 mg/dL   Calcium 7.9 (L) 8.9 - 10.3 mg/dL   GFR calc non Af Amer 11 (L) >60 mL/min   GFR   calc Af Amer 13 (L) >60 mL/min    Comment: (NOTE) The eGFR has been calculated using the CKD EPI equation. This calculation has not been validated in all clinical situations. eGFR's persistently <60 mL/min signify possible Chronic Kidney Disease.    Anion gap 9 5 - 15  CBC     Status: Abnormal   Collection Time: 11/18/15  6:13 AM  Result Value Ref Range   WBC 9.0 4.0 - 10.5 K/uL   RBC 3.74 (L) 3.87 - 5.11 MIL/uL   Hemoglobin 10.4 (L) 12.0 - 15.0 g/dL   HCT 33.8 (L) 36.0 - 46.0 %   MCV 90.4 78.0 - 100.0 fL   MCH 27.8 26.0 - 34.0 pg   MCHC 30.8 30.0 - 36.0 g/dL   RDW 17.7 (H) 11.5 - 15.5 %   Platelets 269 150 - 400 K/uL  Glucose, capillary     Status: None   Collection Time: 11/18/15  7:41 AM  Result Value Ref Range   Glucose-Capillary 91 65 - 99 mg/dL   Comment 1 Notify RN   Blood gas, arterial     Status: Abnormal   Collection Time: 11/18/15  9:30 AM  Result Value Ref Range   pH, Arterial 7.172 (LL) 7.350 - 7.450    Comment: CRITICAL RESULT CALLED  TO, READ BACK BY AND VERIFIED WITH: JAMIE COVINGTON RN BY ROBINPOWELLRRT ON 11/18/15 AT 0940    pCO2 arterial 79.7 (HH) 35.0 - 45.0 mmHg    Comment: CRITICAL RESULT CALLED TO, READ BACK BY AND VERIFIED WITH:  JAMIE COVINGTON RN BY ROBIN POWELL RRT ON 11/18/15 AT 0940    pO2, Arterial 73.3 (L) 80.0 - 100.0 mmHg   Bicarbonate 22.8 20.0 - 24.0 mEq/L   Acid-Base Excess 0.3 0.0 - 2.0 mmol/L   O2 Saturation 89.6 %   Collection site LEFT RADIAL    Drawn by [id number]    Sample type ARTERIAL    Allens test (pass/fail) PASS PASS  MRSA PCR Screening     Status: None   Collection Time: 11/18/15 11:45 AM  Result Value Ref Range   MRSA by PCR NEGATIVE NEGATIVE    Comment:        The GeneXpert MRSA Assay (FDA approved for NASAL specimens only), is one component of a comprehensive MRSA colonization surveillance program. It is not intended to diagnose MRSA infection nor to guide or monitor treatment for MRSA infections.   Glucose, capillary     Status: None   Collection Time: 11/18/15  4:50 PM  Result Value Ref Range   Glucose-Capillary 82 65 - 99 mg/dL   Comment 1 Notify RN    Comment 2 Document in Chart   Glucose, capillary     Status: None   Collection Time: 11/18/15  8:22 PM  Result Value Ref Range   Glucose-Capillary 86 65 - 99 mg/dL   Comment 1 Notify RN    Comment 2 Document in Chart   Basic metabolic panel     Status: Abnormal   Collection Time: 11/19/15  8:00 AM  Result Value Ref Range   Sodium 134 (L) 135 - 145 mmol/L   Potassium 5.0 3.5 - 5.1 mmol/L   Chloride 96 (L) 101 - 111 mmol/L   CO2 29 22 - 32 mmol/L   Glucose, Bld 90 65 - 99 mg/dL   BUN 43 (H) 6 - 20 mg/dL   Creatinine, Ser 3.87 (H) 0.44 - 1.00 mg/dL   Calcium 8.0 (L) 8.9 - 10.3 mg/dL     GFR calc non Af Amer 13 (L) >60 mL/min   GFR calc Af Amer 15 (L) >60 mL/min    Comment: (NOTE) The eGFR has been calculated using the CKD EPI equation. This calculation has not been validated in all clinical situations. eGFR's  persistently <60 mL/min signify possible Chronic Kidney Disease.    Anion gap 9 5 - 15  CBC     Status: Abnormal   Collection Time: 11/19/15  8:00 AM  Result Value Ref Range   WBC 8.6 4.0 - 10.5 K/uL   RBC 3.81 (L) 3.87 - 5.11 MIL/uL   Hemoglobin 10.7 (L) 12.0 - 15.0 g/dL   HCT 33.9 (L) 36.0 - 46.0 %   MCV 89.0 78.0 - 100.0 fL   MCH 28.1 26.0 - 34.0 pg   MCHC 31.6 30.0 - 36.0 g/dL   RDW 17.5 (H) 11.5 - 15.5 %   Platelets 234 150 - 400 K/uL  Phosphorus     Status: Abnormal   Collection Time: 11/19/15  8:00 AM  Result Value Ref Range   Phosphorus 6.5 (H) 2.5 - 4.6 mg/dL    ABGS  Recent Labs  11/18/15 0930  PHART 7.172*  PO2ART 73.3*  HCO3 22.8   CULTURES Recent Results (from the past 240 hour(s))  Blood culture (routine x 2)     Status: None   Collection Time: 11/13/15  3:31 PM  Result Value Ref Range Status   Specimen Description BLOOD LEFT ARM  Final   Special Requests   Final    BOTTLES DRAWN AEROBIC AND ANAEROBIC AEB=10CC ANA=6CC   Culture NO GROWTH 5 DAYS  Final   Report Status 11/18/2015 FINAL  Final  Blood culture (routine x 2)     Status: None   Collection Time: 11/13/15  3:37 PM  Result Value Ref Range Status   Specimen Description BLOOD RIGHT ARM  Final   Special Requests   Final    BOTTLES DRAWN AEROBIC AND ANAEROBIC AEB 6CC ANA 4CC   Culture NO GROWTH 5 DAYS  Final   Report Status 11/18/2015 FINAL  Final  MRSA PCR Screening     Status: None   Collection Time: 11/18/15 11:45 AM  Result Value Ref Range Status   MRSA by PCR NEGATIVE NEGATIVE Final    Comment:        The GeneXpert MRSA Assay (FDA approved for NASAL specimens only), is one component of a comprehensive MRSA colonization surveillance program. It is not intended to diagnose MRSA infection nor to guide or monitor treatment for MRSA infections.    Studies/Results: Us Renal  11/17/2015  CLINICAL DATA:  49-year-old female with chronic renal failure, beginning dialysis. Subsequent  encounter. EXAM: RENAL / URINARY TRACT ULTRASOUND COMPLETE COMPARISON:  Noncontrast CT Abdomen and Pelvis 12/02/2014 FINDINGS: Large body habitus. Right Kidney: Length: 13.6 cm. Echogenicity within normal limits. No mass or hydronephrosis visualized. Left Kidney: Length: Could not be identified despite multiple attempts. Bladder: Decompressed via Foley catheter. IMPRESSION: Negative sonographic appearance of the right kidney. The left kidney could not be visualized despite multiple attempts. Electronically Signed   By: H  Hall M.D.   On: 11/17/2015 11:10    Medications:  Prior to Admission:  Prescriptions prior to admission  Medication Sig Dispense Refill Last Dose  . ALPRAZolam (XANAX) 0.5 MG tablet Take 0.5 mg by mouth 3 (three) times daily as needed for anxiety or sleep.    11/12/2015 at Unknown time  . amLODipine (NORVASC) 10 MG tablet Take 10 mg   by mouth daily.   11/12/2015 at Unknown time  . insulin NPH-insulin regular (NOVOLIN 70/30) (70-30) 100 UNIT/ML injection Inject 60 Units into the skin 2 (two) times daily.   11/12/2015 at Unknown time  . metoprolol (LOPRESSOR) 50 MG tablet Take 50 mg by mouth 2 (two) times daily.   11/12/2015 at Unknown time  . Oxycodone HCl 10 MG TABS Take 10 mg by mouth 4 (four) times daily as needed (pain).    Past Week at Unknown time  . potassium chloride SA (K-DUR,KLOR-CON) 20 MEQ tablet Take 20 mEq by mouth 3 (three) times daily.   11/12/2015 at Unknown time  . torsemide (DEMADEX) 100 MG tablet Take 100 mg by mouth 2 (two) times daily.    11/12/2015 at Unknown time  . acetaminophen (TYLENOL) 325 MG tablet Take 650 mg by mouth every 6 (six) hours as needed. fever   unknown  . diphenhydrAMINE (BENADRYL) 25 mg capsule Take 25 mg by mouth every 6 (six) hours as needed for allergies.   unknown  . docusate sodium (COLACE) 100 MG capsule Take 100 mg by mouth 2 (two) times daily as needed. For constipation   unknown  . HYDROcodone-acetaminophen (NORCO/VICODIN) 5-325 MG  per tablet Take 2 tablets by mouth every 4 (four) hours as needed. (Patient not taking: Reported on 12/02/2014) 10 tablet 0   . oseltamivir (TAMIFLU) 75 MG capsule Take 1 capsule (75 mg total) by mouth every 12 (twelve) hours. (Patient not taking: Reported on 12/02/2014) 10 capsule 0   . oxyCODONE-acetaminophen (PERCOCET/ROXICET) 5-325 MG per tablet Take 1 tablet by mouth every 6 (six) hours as needed for severe pain. 15 tablet 0 unknown   Scheduled: . clindamycin (CLEOCIN) IV  600 mg Intravenous 4 times per day  . enoxaparin (LOVENOX) injection  30 mg Subcutaneous Q24H  . furosemide  200 mg Intravenous BID  . insulin aspart protamine- aspart  60 Units Subcutaneous BID WC  . metolazone  5 mg Oral BID  . metoprolol  50 mg Oral BID  . nystatin   Topical BID  . sodium chloride  3 mL Intravenous Q12H  . sodium chloride  3 mL Intravenous Q12H  . tuberculin  5 Units Intradermal Once   Continuous:  ZYS:AYTKZS chloride, sodium chloride, sodium chloride, sodium chloride, sodium chloride, acetaminophen **OR** acetaminophen, acetaminophen, ALPRAZolam, alteplase, alteplase, diphenhydrAMINE, lidocaine (PF), lidocaine-prilocaine, ondansetron (ZOFRAN) IV, oxyCODONE, pentafluoroprop-tetrafluoroeth, sodium chloride, sodium chloride, sodium chloride  Assesment: She was admitted with cellulitis of her leg. She had been able to be managed on the floor until yesterday morning. Yesterday morning she was confused and had developed acute on chronic respiratory failure requiring BiPAP. She has required BiPAP through the day yesterday. Although she has not been diagnosed with sleep apnea I believe she does have likely sleep apnea and certainly obesity hypoventilation syndrome. She has chronic hypoxic respiratory failure.  She has acute renal failure and has required dialysis. She has been oliguric  She has morbid obesity and associated lymphedema which is unchanged  She has diabetes and because she's not been able to  eat very well I'm going to need to change her insulin  She has cellulitis of the leg and that looks a little better.  She has multiple electrolyte imbalances being managed with dialysis and they are improving  She has chronic diastolic heart failure and had 3.6 L removed yesterday   Principal Problem:   Cellulitis of leg without foot, right Active Problems:   Lymphedema of lower extremity  Diabetes mellitus ([place])   Morbid obesity ([facility])   Hyperkalemia   Chronic diastolic CHF (congestive heart failure) (HCC)   Chronic respiratory failure with hypoxia (H[facility])   CKD (chronic kidney disease), stage III   Anemia of chronic disease    Plan: Continue in the intensive care unit. She has a complicated situation with respiratory and renal failure. She is being managed with BiPAP and dialysis at this point. She has improved somewhat. Change the way she is being given insulin. Continue with everything else.    LOS: 6 days   Lynnann Knudsen L 11/19/2015, 8:40 AM

## 2015-11-19 NOTE — Progress Notes (Signed)
Initial Nutrition Assessment  DOCUMENTATION CODES:  Morbid obesity  INTERVENTION:  Glucerna Shake po BID, each supplement provides 220 kcal and 10 grams of protein  Snacks/Food preferences taken  NUTRITION DIAGNOSIS:  Inadequate oral intake related to poor appetite as evidenced by Reportedly having nothing to eat x 1 week.  GOAL:  Patient will meet greater than or equal to 90% of their needs  MONITOR:  PO intake, Supplement acceptance, Labs, Weight trends  REASON FOR ASSESSMENT:  Low [place]    ASSESSMENT:  49 y/o female PMH DM2, CAS, HTN, Hypercholesterolemia,  BLE lymphedema, grade 2 diastolic dysfunction, morbid obesity presented to ED with h/o progressive BLE edema, n,v, poor PO intake, SOB, decreased UOP, pain and weeping. Initial evaluation was concerning for early cellulitis. During admission devleoped  Acute respiratory failure (subsequent transfer to ICU) and AKI on CKD3 s/p dialysis 11/28 and 11/29  Spoke with family member at bedside as pt was asleep on RD arrival. He reports that Thanksgiving morning, pt experienced very difficult breathing and went to the hospital. Since that day she has had nothing to eat (nursing/emr documentation confirms no intake). She has simply not had any appetite at all and the smell of food would make very nauseated.  PTA she did not take any vit/min supplements and did not follow a DM diet.   Family member is unsure of UBW as pt would not tell him. However, he thought ~450 was "probably about right". Per emr records, she looks to have gained ~100 lbs in <1 year.   Pt has tried ensure in the past, but has not liked. We can try Glucerna. Also took snack/meal preferences that pt may have appetite for.   NFPE: Only visual due to pt sleeping WDL  Diet Order:  Diet Heart Room service appropriate?: Yes; Fluid consistency:: Thin  Skin:  Cellulitis BLE  Last BM:  11/29  Height:  Ht Readings from Last 1 Encounters:  11/13/15 5' 5" (1.651 m)    Weight:  Wt Readings from Last 1 Encounters:  11/19/15 440 lb 14.7 oz (200 kg)    Wt Readings from Last 10 Encounters:  11/19/15 440 lb 14.7 oz (200 kg)  09/11/15 350 lb (158.759 kg)  12/02/14 350 lb (158.759 kg)  11/06/14 340 lb (154.223 kg)  08/11/13 330 lb (149.687 kg)  05/30/13 342 lb (155.13 kg)  04/04/13 370 lb (167.831 kg)  09/05/12 388 lb 0.2 oz (176 kg)  07/08/12 375 lb 10.6 oz (170.4 kg)  03/08/12 358 lb 14.5 oz (162.8 kg)   Ideal Body Weight:  56.82 kg  BMI:  Body mass index is 73.37 kg/(m^2).  SBW for age/ht: 81 kg  Estimated Nutritional Needs:  Kcal:  1250-1400 kcals Protein:  49-65 g (.6-.8 g/kg SBW) Fluid:  Per MD  EDUCATION NEEDS:  No education needs identified at this time  Burtis Junes RD, LDN Nutrition Pager: [phone] 11/19/2015 2:11 PM

## 2015-11-19 NOTE — Progress Notes (Signed)
[name]  MRN: [id number]  DOB/AGE: 1966-09-27 49 y.o.  Primary Care Physician:HAWKINS,EDWARD L, MD  Admit date: 11/13/2015  Chief Complaint:  Chief Complaint  Patient presents with  . Leg Pain    S-Pt presented on  11/13/2015 with  Chief Complaint  Patient presents with  . Leg Pain  .    Pt today feels better.Pt is asking questions about her lab results.   meds . clindamycin (CLEOCIN) IV  600 mg Intravenous 4 times per day  . enoxaparin (LOVENOX) injection  30 mg Subcutaneous Q24H  . furosemide  200 mg Intravenous BID  . insulin aspart  0-20 Units Subcutaneous TID WC  . insulin aspart  0-5 Units Subcutaneous QHS  . metolazone  5 mg Oral BID  . metoprolol  50 mg Oral BID  . nystatin   Topical BID  . sodium chloride  3 mL Intravenous Q12H  . sodium chloride  3 mL Intravenous Q12H  . tuberculin  5 Units Intradermal Once     Physical Exam: Vital signs in last 24 hours: Temp:  [96.5 F (35.8 C)-98.1 F (36.7 C)] 98.1 F (36.7 C) (11/30 0830) Pulse Rate:  [45-58] 51 (11/30 0700) Resp:  [13-19] 15 (11/30 0700) BP: (109-146)/(61-89) 125/73 mmHg (11/30 0700) SpO2:  [93 %-100 %] 99 % (11/30 0700) FiO2 (%):  [40 %] 40 % (11/30 0400) Weight:  [440 lb 14.7 oz (200 kg)-467 lb 6 oz (212 kg)] 440 lb 14.7 oz (200 kg) (11/30 0400) Weight change: 6 lb 9.8 oz (3 kg) Last BM Date: 11/18/15  Intake/Output from previous day: 11/29 0701 - 11/30 0700 In: 1000 [P.O.:120; I.V.:100; IV Piggyback:780] Out: 3800 [Urine:200]     Physical Exam: General- pt is awake,alert, following commands. Resp- No acute REsp distress, CTA B/L NO Rhonchi CVS- S1S2 regular in rate and rhythm GIT- BS+, soft, NT, Morbidly obese EXT- Chronic  LE Edema, NO Cyanosis   Lab Results: CBC  Recent Labs  11/18/15 0613 11/19/15 0800  WBC 9.0 8.6  HGB 10.4* 10.7*  HCT 33.8* 33.9*  PLT 269 234    BMET  Recent Labs  11/18/15 0613 11/19/15 0800  NA 134* 134*  K 5.8* 5.0  CL 97* 96*   CO2 28 29  GLUCOSE 104* 90  BUN 53* 43*  CREATININE 4.32* 3.87*  CALCIUM 7.9* 8.0*    MICRO Recent Results (from the past 240 hour(s))  Blood culture (routine x 2)     Status: None   Collection Time: 11/13/15  3:31 PM  Result Value Ref Range Status   Specimen Description BLOOD LEFT ARM  Final   Special Requests   Final    BOTTLES DRAWN AEROBIC AND ANAEROBIC AEB=10CC ANA=6CC   Culture NO GROWTH 5 DAYS  Final   Report Status 11/18/2015 FINAL  Final  Blood culture (routine x 2)     Status: None   Collection Time: 11/13/15  3:37 PM  Result Value Ref Range Status   Specimen Description BLOOD RIGHT ARM  Final   Special Requests   Final    BOTTLES DRAWN AEROBIC AND ANAEROBIC AEB 6CC ANA 4CC   Culture NO GROWTH 5 DAYS  Final   Report Status 11/18/2015 FINAL  Final  MRSA PCR Screening     Status: None   Collection Time: 11/18/15 11:45 AM  Result Value Ref Range Status   MRSA by PCR NEGATIVE NEGATIVE Final    Comment:        The GeneXpert MRSA Assay (  FDA approved for NASAL specimens only), is one component of a comprehensive MRSA colonization surveillance program. It is not intended to diagnose MRSA infection nor to guide or monitor treatment for MRSA infections.       Lab Results  Component Value Date   PTH 60.6 09/06/2012   CALCIUM 8.0* 11/19/2015   PHOS 6.5* 11/19/2015               Impression: 1)Renal  AKI secondary to ATN               AKI on CKD               CKD stage 3  .               CKD since 2012               CKD secondary to DM/HTN/ Obesity related glomerulopathy                Progression of CKD marked with AKI                Pt initiated on renal replacement therapy on 11/17/15  2)HTN  Medication- On Diuretics On Beta blockers   3)Anemia HGb stable  4)CKD Mineral-Bone Disorder Secondary Hyperparathyroidism absent. Phosphorus not at goal. Calcium when corrected for low albumin is  at goal.  5)ID-admitted with cellulitis PMD  following  6)Electrolytes Hyperkalemic    Started on  Lasix    GIven Kayexalate     Later innitaited on renal replacment therapy      Now better  Hyponatremic   stable   7)Acid base Co2 at goal     Plan:  Will continue current care Pt still oliguric, will most likely need HD in am      Charels Stambaugh S 11/19/2015, 9:49 AM

## 2015-11-20 LAB — BASIC METABOLIC PANEL
ANION GAP: 8 (ref 5–15)
BUN: 50 mg/dL — ABNORMAL HIGH (ref 6–20)
CALCIUM: 8.1 mg/dL — AB (ref 8.9–10.3)
CO2: 29 mmol/L (ref 22–32)
CREATININE: 4.08 mg/dL — AB (ref 0.44–1.00)
Chloride: 98 mmol/L — ABNORMAL LOW (ref 101–111)
GFR calc non Af Amer: 12 mL/min — ABNORMAL LOW (ref >60)
GFR, EST AFRICAN AMERICAN: 14 mL/min — AB (ref >60)
Glucose, Bld: 84 mg/dL (ref 65–99)
Potassium: 5.1 mmol/L (ref 3.5–5.1)
SODIUM: 135 mmol/L (ref 135–145)

## 2015-11-20 LAB — GLUCOSE, CAPILLARY
GLUCOSE-CAPILLARY: 80 mg/dL (ref 65–99)
GLUCOSE-CAPILLARY: 95 mg/dL (ref 65–99)
Glucose-Capillary: 90 mg/dL (ref 65–99)
Glucose-Capillary: 92 mg/dL (ref 65–99)

## 2015-11-20 MED ORDER — SODIUM CHLORIDE 0.9 % IV SOLN
INTRAVENOUS | Status: DC
  Administered 2015-11-20: 11:00:00 via INTRAVENOUS
  Administered 2015-11-21: 75 mL/h via INTRAVENOUS
  Administered 2015-11-22 – 2015-11-26 (×3): via INTRAVENOUS

## 2015-11-20 NOTE — Progress Notes (Signed)
Patient resting with eyes closed, oxygen remains at 5L [place], husband at bedside.  Requested door be closed for patient to rest.

## 2015-11-20 NOTE — Progress Notes (Signed)
Nutrition Follow-up  DOCUMENTATION CODES:  Morbid obesity  INTERVENTION:  Pt still with no intake, now x 8 days. If no intake x 10 days, Enteral Nutrition would be warranted  D/C Glucerna per pt request, Continue to offer snacks/supplements  NUTRITION DIAGNOSIS:  Inadequate oral intake related to poor appetite as evidenced by Reportedly having nothing to eat x 1 week.  Ongoing, worsening  GOAL:  Patient will meet greater than or equal to 90% of their needs  MONITOR:  PO intake, Supplement acceptance, Labs, Weight trends   ASSESSMENT:  49 y/o female PMH DM2, CAS, HTN, Hypercholesterolemia,  BLE lymphedema, grade 2 diastolic dysfunction, morbid obesity presented to ED with h/o progressive BLE edema, n,v, poor PO intake, SOB, decreased UOP, pain and weeping. Initial evaluation was concerning for early cellulitis. During admission devleoped  Acute respiratory failure (subsequent transfer to ICU) and AKI on CKD3 s/p dialysis 11/28 and 11/29  Interval Hx 11/30-12/1: Patient still w/ nausea still. Still no intake. Weighed yesterday as 363 lbs?  Briefly followed up with pt as she was asleep during initial assessment yesterday. She still has had nothing to eat. She states she does not like Glucerna. She could not think of any foods at all she had an appetite for; she has no appetite. Advised pt that she does need nutrition and if she is unable to eat in the next couple days, enteral nutrition would be reccommended. Encouraged her to try to eat/drink anything she could.  Diet Order:  Diet Heart Room service appropriate?: Yes; Fluid consistency:: Thin  Skin:  Cellulitis BLE  Last BM:  11/29  Height:  Ht Readings from Last 1 Encounters:  11/13/15 _0  (1.651 m)   Weight:  Wt Readings from Last 1 Encounters:  11/20/15 363 lb (164.656 kg)    Wt Readings from Last 10 Encounters:  11/20/15 363 lb (164.656 kg)  09/11/15 350 lb (158.759 kg)  12/02/14 350 lb (158.759 kg)  11/06/14 340  lb (154.223 kg)  08/11/13 330 lb (149.687 kg)  05/30/13 342 lb (155.13 kg)  04/04/13 370 lb (167.831 kg)  09/05/12 388 lb 0.2 oz (176 kg)  07/08/12 375 lb 10.6 oz (170.4 kg)  03/08/12 358 lb 14.5 oz (162.8 kg)   Ideal Body Weight:  56.82 kg  BMI:  Body mass index is 60.41 kg/(m^2).  SBW for age/ht: 81 kg  Estimated Nutritional Needs:  Kcal:  1250-1400 kcals Protein:  49-65 g (.6-.8 g/kg SBW) Fluid:  Per MD  EDUCATION NEEDS:  No education needs identified at this time  Burtis Junes RD, LDN Nutrition Pager: [phone] 11/20/2015 12:00 PM

## 2015-11-20 NOTE — Progress Notes (Signed)
Subjective: Patient complains of still some nausea. She didn't have any vomiting. Presently denies any difficulty in breathing.  Objective: Vital signs in last 24 hours: Temp:  [97.5 F (36.4 C)-98.2 F (36.8 C)] 97.7 F (36.5 C) (12/01 0800) Pulse Rate:  [28-66] 62 (12/01 0700) Resp:  [13-21] 21 (12/01 0700) BP: (107-145)/(58-92) 119/66 mmHg (12/01 0700) SpO2:  [87 %-100 %] 93 % (12/01 0700) Weight:  [363 lb (164.656 kg)] 363 lb (164.656 kg) (12/01 0500)  Intake/Output from previous day: 11/30 0701 - 12/01 0700 In: 240 [IV Piggyback:240] Out: 900 [Urine:900] Intake/Output this shift:     Recent Labs  11/17/15 1555 11/18/15 0613 11/19/15 0800  HGB 11.1* 10.4* 10.7*    Recent Labs  11/18/15 0613 11/19/15 0800  WBC 9.0 8.6  RBC 3.74* 3.81*  HCT 33.8* 33.9*  PLT 269 234    Recent Labs  11/19/15 0800 11/20/15 0510  NA 134* 135  K 5.0 5.1  CL 96* 98*  CO2 29 29  BUN 43* 50*  CREATININE 3.87* 4.08*  GLUCOSE 90 84  CALCIUM 8.0* 8.1*   No results for input(s): LABPT, INR in the last 72 hours.  Generally patient is alert and in no apparent distress Chest decreased breath sound. Heart exam regular rate and rhythm Abdomen obese Extremities patient with chronic lymphedema   Assessment/Plan:  Problem #1 acute kidney injury superimposed on chronic. Patient has received 2 days of dialysis. The last dialysis was on 11/18/2015. She didn't receive any dialysis yesterday. Presently patient had 900 mL of urine output hence becoming non-oliguric. There is slight increase in BUN and creatinine otherwise stable. roblem #2 hyperkalemia: Her potassium 5.1 corrected. Problem #3 diabetes: Her blood sugar is reasonably controlled. Problem #4 morbid obesity Problem #5 chronic renal failure: Stage III Problem #6 anemia: Her hemoglobin is stable. Problem #7 cellulitis of her left foot. Problem #8 nausea and vomiting. Etiology of this moment not clear. At this moment dialysis  even didn't help her. Possibly GI problem. Plan:1] We'll hold dialysis today. 2] we will start patient on normal saline at 75 mL per hour 3] continue his Lasix 4]We'll check her basic metabolic panel in the morning     [facility] S 11/20/2015, 8:13 AM

## 2015-11-20 NOTE — Progress Notes (Addendum)
Arrived from ICU to room 326 via bed with air mattress, IV of NS infusing at 75, Cleocin completed, CBG 90 at 1200 and no coverage required, husband present in wheelchair, O2 at 2L oxygen tank for transport, connected to wall at O2 2L [place].  No distress noted at this time.  Foley catheter intact with amber urine.

## 2015-11-20 NOTE — Care Management Note (Signed)
Case Management Note  Patient Details  Name: CHANDLAR STAEBELL MRN: [id number] Date of Birth: 1966/01/13  Subjective/Objective:                    Action/Plan:   Expected Discharge Date:  11/18/15               Expected Discharge Plan:  Home/Self Care  In-House Referral:  Financial Counselor  Discharge planning Services  CM Consult  Post Acute Care Choice:  NA Choice offered to:  NA  DME Arranged:    DME Agency:     HH Arranged:    [place] Agency:     Status of Service:  In process, will continue to follow  Medicare Important Message Given:    Date Medicare IM Given:    Medicare IM give by:    Date Additional Medicare IM Given:    Additional Medicare Important Message give by:     If discussed at [place] of Stay Meetings, dates discussed:  11/20/2015  Additional Comments:  Sherald Barge, RN 11/20/2015, 3:15 PM

## 2015-11-20 NOTE — Progress Notes (Signed)
RT to room, increased oxygen to 5L and patient sat 93%.  Will continue to monitor.

## 2015-11-20 NOTE — Progress Notes (Signed)
Called report to Hampton Abbot, RN on dept 300. Verbalized understanding. Pt transferred to room 327 in safe and stable condition.

## 2015-11-20 NOTE — Progress Notes (Signed)
Subjective: She is generally better. Her nausea has improved and she has not vomited. She has not required BiPAP for the last 16 hours or so. She is making a little bit more urine.  Objective: Vital signs in last 24 hours: Temp:  [97.5 F (36.4 C)-98.2 F (36.8 C)] 97.7 F (36.5 C) (12/01 0800) Pulse Rate:  [28-66] 62 (12/01 0700) Resp:  [13-21] 21 (12/01 0700) BP: (107-145)/(58-92) 119/66 mmHg (12/01 0700) SpO2:  [87 %-98 %] 93 % (12/01 0700) Weight:  [164.656 kg (363 lb)] 164.656 kg (363 lb) (12/01 0500) Weight change: -47.344 kg (-104 lb 6 oz) Last BM Date: 11/18/15  Intake/Output from previous day: 11/30 0701 - 12/01 0700 In: 240 [IV Piggyback:240] Out: 900 [Urine:900]  PHYSICAL EXAM General appearance: alert, cooperative, mild distress and morbidly obese Resp: Bilaterally diminished breath sounds Cardio: regular rate and rhythm, S1, S2 normal, no murmur, click, rub or gallop GI: soft, non-tender; bowel sounds normal; no masses,  no organomegaly Extremities: She has significant lymphedema. Her cellulitis is slowly improving  Lab Results:  Results for orders placed or performed during the hospital encounter of 11/13/15 (from the past 48 hour(s))  Blood gas, arterial     Status: Abnormal   Collection Time: 11/18/15  9:30 AM  Result Value Ref Range   pH, Arterial 7.172 (LL) 7.350 - 7.450    Comment: CRITICAL RESULT CALLED TO, READ BACK BY AND VERIFIED WITH: JAMIE COVINGTON RN BY ROBINPOWELLRRT ON 11/18/15 AT 0940    pCO2 arterial 79.7 (HH) 35.0 - 45.0 mmHg    Comment: CRITICAL RESULT CALLED TO, READ BACK BY AND VERIFIED WITH:  JAMIE COVINGTON RN BY ROBIN POWELL RRT ON 11/18/15 AT 0940    pO2, Arterial 73.3 (L) 80.0 - 100.0 mmHg   Bicarbonate 22.8 20.0 - 24.0 mEq/L   Acid-Base Excess 0.3 0.0 - 2.0 mmol/L   O2 Saturation 89.6 %   Collection site LEFT RADIAL    Drawn by [id number]    Sample type ARTERIAL    Allens test (pass/fail) PASS PASS  MRSA PCR Screening     Status:  None   Collection Time: 11/18/15 11:45 AM  Result Value Ref Range   MRSA by PCR NEGATIVE NEGATIVE    Comment:        The GeneXpert MRSA Assay (FDA approved for NASAL specimens only), is one component of a comprehensive MRSA colonization surveillance program. It is not intended to diagnose MRSA infection nor to guide or monitor treatment for MRSA infections.   Glucose, capillary     Status: None   Collection Time: 11/18/15  4:50 PM  Result Value Ref Range   Glucose-Capillary 82 65 - 99 mg/dL   Comment 1 Notify RN    Comment 2 Document in Chart   Glucose, capillary     Status: None   Collection Time: 11/18/15  8:22 PM  Result Value Ref Range   Glucose-Capillary 86 65 - 99 mg/dL   Comment 1 Notify RN    Comment 2 Document in Chart   Glucose, capillary     Status: None   Collection Time: 11/19/15  7:44 AM  Result Value Ref Range   Glucose-Capillary 80 65 - 99 mg/dL   Comment 1 Notify RN    Comment 2 Document in Chart   Basic metabolic panel     Status: Abnormal   Collection Time: 11/19/15  8:00 AM  Result Value Ref Range   Sodium 134 (L) 135 - 145 mmol/L   Potassium  5.0 3.5 - 5.1 mmol/L   Chloride 96 (L) 101 - 111 mmol/L   CO2 29 22 - 32 mmol/L   Glucose, Bld 90 65 - 99 mg/dL   BUN 43 (H) 6 - 20 mg/dL   Creatinine, Ser 3.87 (H) 0.44 - 1.00 mg/dL   Calcium 8.0 (L) 8.9 - 10.3 mg/dL   GFR calc non Af Amer 13 (L) >60 mL/min   GFR calc Af Amer 15 (L) >60 mL/min    Comment: (NOTE) The eGFR has been calculated using the CKD EPI equation. This calculation has not been validated in all clinical situations. eGFR's persistently <60 mL/min signify possible Chronic Kidney Disease.    Anion gap 9 5 - 15  CBC     Status: Abnormal   Collection Time: 11/19/15  8:00 AM  Result Value Ref Range   WBC 8.6 4.0 - 10.5 K/uL   RBC 3.81 (L) 3.87 - 5.11 MIL/uL   Hemoglobin 10.7 (L) 12.0 - 15.0 g/dL   HCT 33.9 (L) 36.0 - 46.0 %   MCV 89.0 78.0 - 100.0 fL   MCH 28.1 26.0 - 34.0 pg   MCHC  31.6 30.0 - 36.0 g/dL   RDW 17.5 (H) 11.5 - 15.5 %   Platelets 234 150 - 400 K/uL  Phosphorus     Status: Abnormal   Collection Time: 11/19/15  8:00 AM  Result Value Ref Range   Phosphorus 6.5 (H) 2.5 - 4.6 mg/dL  Glucose, capillary     Status: None   Collection Time: 11/19/15 11:24 AM  Result Value Ref Range   Glucose-Capillary 76 65 - 99 mg/dL   Comment 1 Notify RN    Comment 2 Document in Chart   Glucose, capillary     Status: None   Collection Time: 11/19/15  4:18 PM  Result Value Ref Range   Glucose-Capillary 91 65 - 99 mg/dL   Comment 1 Notify RN    Comment 2 Document in Chart   Glucose, capillary     Status: None   Collection Time: 11/19/15  9:31 PM  Result Value Ref Range   Glucose-Capillary 81 65 - 99 mg/dL   Comment 1 Notify RN   Basic metabolic panel     Status: Abnormal   Collection Time: 11/20/15  5:10 AM  Result Value Ref Range   Sodium 135 135 - 145 mmol/L   Potassium 5.1 3.5 - 5.1 mmol/L   Chloride 98 (L) 101 - 111 mmol/L   CO2 29 22 - 32 mmol/L   Glucose, Bld 84 65 - 99 mg/dL   BUN 50 (H) 6 - 20 mg/dL   Creatinine, Ser 4.08 (H) 0.44 - 1.00 mg/dL   Calcium 8.1 (L) 8.9 - 10.3 mg/dL   GFR calc non Af Amer 12 (L) >60 mL/min   GFR calc Af Amer 14 (L) >60 mL/min    Comment: (NOTE) The eGFR has been calculated using the CKD EPI equation. This calculation has not been validated in all clinical situations. eGFR's persistently <60 mL/min signify possible Chronic Kidney Disease.    Anion gap 8 5 - 15  Glucose, capillary     Status: None   Collection Time: 11/20/15  7:36 AM  Result Value Ref Range   Glucose-Capillary 80 65 - 99 mg/dL   Comment 1 Notify RN    Comment 2 Document in Chart     ABGS  Recent Labs  11/18/15 0930  PHART 7.172*  PO2ART 73.3*  HCO3 22.8  CULTURES Recent Results (from the past 240 hour(s))  Blood culture (routine x 2)     Status: None   Collection Time: 11/13/15  3:31 PM  Result Value Ref Range Status   Specimen  Description BLOOD LEFT ARM  Final   Special Requests   Final    BOTTLES DRAWN AEROBIC AND ANAEROBIC AEB=10CC ANA=6CC   Culture NO GROWTH 5 DAYS  Final   Report Status 11/18/2015 FINAL  Final  Blood culture (routine x 2)     Status: None   Collection Time: 11/13/15  3:37 PM  Result Value Ref Range Status   Specimen Description BLOOD RIGHT ARM  Final   Special Requests   Final    BOTTLES DRAWN AEROBIC AND ANAEROBIC AEB 6CC ANA 4CC   Culture NO GROWTH 5 DAYS  Final   Report Status 11/18/2015 FINAL  Final  MRSA PCR Screening     Status: None   Collection Time: 11/18/15 11:45 AM  Result Value Ref Range Status   MRSA by PCR NEGATIVE NEGATIVE Final    Comment:        The GeneXpert MRSA Assay (FDA approved for NASAL specimens only), is one component of a comprehensive MRSA colonization surveillance program. It is not intended to diagnose MRSA infection nor to guide or monitor treatment for MRSA infections.    Studies/Results: No results found.  Medications:  Prior to Admission:  Prescriptions prior to admission  Medication Sig Dispense Refill Last Dose  . ALPRAZolam (XANAX) 0.5 MG tablet Take 0.5 mg by mouth 3 (three) times daily as needed for anxiety or sleep.    11/12/2015 at Unknown time  . amLODipine (NORVASC) 10 MG tablet Take 10 mg by mouth daily.   11/12/2015 at Unknown time  . insulin NPH-insulin regular (NOVOLIN 70/30) (70-30) 100 UNIT/ML injection Inject 60 Units into the skin 2 (two) times daily.   11/12/2015 at Unknown time  . metoprolol (LOPRESSOR) 50 MG tablet Take 50 mg by mouth 2 (two) times daily.   11/12/2015 at Unknown time  . Oxycodone HCl 10 MG TABS Take 10 mg by mouth 4 (four) times daily as needed (pain).    Past Week at Unknown time  . potassium chloride SA (K-DUR,KLOR-CON) 20 MEQ tablet Take 20 mEq by mouth 3 (three) times daily.   11/12/2015 at Unknown time  . torsemide (DEMADEX) 100 MG tablet Take 100 mg by mouth 2 (two) times daily.    11/12/2015 at  Unknown time  . acetaminophen (TYLENOL) 325 MG tablet Take 650 mg by mouth every 6 (six) hours as needed. fever   unknown  . diphenhydrAMINE (BENADRYL) 25 mg capsule Take 25 mg by mouth every 6 (six) hours as needed for allergies.   unknown  . docusate sodium (COLACE) 100 MG capsule Take 100 mg by mouth 2 (two) times daily as needed. For constipation   unknown  . HYDROcodone-acetaminophen (NORCO/VICODIN) 5-325 MG per tablet Take 2 tablets by mouth every 4 (four) hours as needed. (Patient not taking: Reported on 12/02/2014) 10 tablet 0   . oseltamivir (TAMIFLU) 75 MG capsule Take 1 capsule (75 mg total) by mouth every 12 (twelve) hours. (Patient not taking: Reported on 12/02/2014) 10 capsule 0   . oxyCODONE-acetaminophen (PERCOCET/ROXICET) 5-325 MG per tablet Take 1 tablet by mouth every 6 (six) hours as needed for severe pain. 15 tablet 0 unknown   Scheduled: . clindamycin (CLEOCIN) IV  600 mg Intravenous 4 times per day  . enoxaparin (LOVENOX) injection  30  mg Subcutaneous Q24H  . feeding supplement (GLUCERNA SHAKE)  237 mL Oral BID BM  . furosemide  200 mg Intravenous BID  . insulin aspart  0-20 Units Subcutaneous TID WC  . insulin aspart  0-5 Units Subcutaneous QHS  . metolazone  5 mg Oral BID  . metoprolol  50 mg Oral BID  . nystatin   Topical BID  . sodium chloride  3 mL Intravenous Q12H  . sodium chloride  3 mL Intravenous Q12H   Continuous: . sodium chloride     Z[facility]:EYEMVV chloride, sodium chloride, sodium chloride, sodium chloride, sodium chloride, acetaminophen **OR** acetaminophen, acetaminophen, ALPRAZolam, alteplase, alteplase, diphenhydrAMINE, lidocaine (PF), lidocaine-prilocaine, ondansetron (ZOFRAN) IV, oxyCODONE, pentafluoroprop-tetrafluoroeth, sodium chloride, sodium chloride, sodium chloride  Assesment: She was admitted with cellulitis of the leg. Her situation is complicated by acute on chronic renal failure and she's required dialysis. She is making a bit more urine now.  She also had obesity hypoventilation and acute on chronic respiratory failure requiring BiPAP. She has improved. Her cellulitis is better. She has chronic diastolic heart failure but her intake and output is negative so far. She has diabetes which is relatively well controlled. Principal Problem:   Cellulitis of leg without foot, right Active Problems:   Lymphedema of lower extremity   Diabetes mellitus (HCC)   Morbid obesity (HCC)   Hyperkalemia   Obesity hypoventilation syndrome (HCC)   Chronic diastolic CHF (congestive heart failure) (HCC)   Chronic respiratory failure with hypoxia (HCC)   CKD (chronic kidney disease), stage III   Anemia of chronic disease   Acute on chronic respiratory failure ([place])    Plan: Okay to transfer from ICU    LOS: 7 days   Herndon Grill L 11/20/2015, 9:00 AM

## 2015-11-21 LAB — COMPREHENSIVE METABOLIC PANEL
ALT: 24 U/L (ref 14–54)
ANION GAP: 12 (ref 5–15)
AST: 21 U/L (ref 15–41)
Albumin: 3.1 g/dL — ABNORMAL LOW (ref 3.5–5.0)
Alkaline Phosphatase: 110 U/L (ref 38–126)
BUN: 58 mg/dL — ABNORMAL HIGH (ref 6–20)
CHLORIDE: 95 mmol/L — AB (ref 101–111)
CO2: 28 mmol/L (ref 22–32)
CREATININE: 4.25 mg/dL — AB (ref 0.44–1.00)
Calcium: 8.4 mg/dL — ABNORMAL LOW (ref 8.9–10.3)
GFR, EST AFRICAN AMERICAN: 13 mL/min — AB (ref >60)
GFR, EST NON AFRICAN AMERICAN: 11 mL/min — AB (ref >60)
Glucose, Bld: 108 mg/dL — ABNORMAL HIGH (ref 65–99)
POTASSIUM: 5.3 mmol/L — AB (ref 3.5–5.1)
Sodium: 135 mmol/L (ref 135–145)
Total Bilirubin: 1.4 mg/dL — ABNORMAL HIGH (ref 0.3–1.2)
Total Protein: 7.5 g/dL (ref 6.5–8.1)

## 2015-11-21 LAB — GLUCOSE, CAPILLARY
GLUCOSE-CAPILLARY: 111 mg/dL — AB (ref 65–99)
GLUCOSE-CAPILLARY: 121 mg/dL — AB (ref 65–99)
GLUCOSE-CAPILLARY: 89 mg/dL (ref 65–99)

## 2015-11-21 LAB — CBC
HCT: 32 % — ABNORMAL LOW (ref 36.0–46.0)
Hemoglobin: 10.5 g/dL — ABNORMAL LOW (ref 12.0–15.0)
MCH: 28.5 pg (ref 26.0–34.0)
MCHC: 32.8 g/dL (ref 30.0–36.0)
MCV: 86.7 fL (ref 78.0–100.0)
PLATELETS: 216 10*3/uL (ref 150–400)
RBC: 3.69 MIL/uL — AB (ref 3.87–5.11)
RDW: 17 % — ABNORMAL HIGH (ref 11.5–15.5)
WBC: 11.4 10*3/uL — AB (ref 4.0–10.5)

## 2015-11-21 MED ORDER — ONDANSETRON HCL 4 MG/2ML IJ SOLN
4.0000 mg | Freq: Four times a day (QID) | INTRAMUSCULAR | Status: DC
  Administered 2015-11-21 – 2015-12-05 (×41): 4 mg via INTRAVENOUS
  Filled 2015-11-21 (×35): qty 2

## 2015-11-21 MED ORDER — SODIUM CHLORIDE 0.9 % IV SOLN: 100.0000 mL | INTRAVENOUS | Status: DC | PRN

## 2015-11-21 MED ORDER — HEPARIN SODIUM (PORCINE) 1000 UNIT/ML IJ SOLN
INTRAMUSCULAR | Status: AC
  Filled 2015-11-21: qty 7

## 2015-11-21 MED ORDER — HEPARIN SODIUM (PORCINE) 1000 UNIT/ML DIALYSIS
1000.0000 [IU] | INTRAMUSCULAR | Status: DC | PRN
  Filled 2015-11-21: qty 1

## 2015-11-21 NOTE — Procedures (Signed)
   HEMODIALYSIS TREATMENT NOTE:  4 hour heparin-free dialysis completed via right femoral temporary Hd catheter. Catheter dressing changed yesterday with 2-staff assist. Goal met: 3.8 liters removed. No interruption in ultrafiltration. All blood was reinfused. "Potassium Finder" handout provided to patient. Report called to Sharen Hones, RN.  Rockwell [place], RN, CDN

## 2015-11-21 NOTE — Care Management Note (Signed)
Case Management Note  Patient Details  Name: [name] MRN: [id number] Date of Birth: 1966-10-17  Subjective/Objective:                    Action/Plan:   Expected Discharge Date:  11/18/15               Expected Discharge Plan:  Home/Self Care  In-House Referral:  Financial Counselor  Discharge planning Services  CM Consult  Post Acute Care Choice:  Home Health, Durable Medical Equipment Choice offered to:  Patient  DME Arranged:    DME Agency:     HH Arranged:    [place] Agency:     Status of Service:  In process, will continue to follow  Medicare Important Message Given:    Date Medicare IM Given:    Medicare IM give by:    Date Additional Medicare IM Given:    Additional Medicare Important Message give by:     If discussed at [place] of Stay Meetings, dates discussed:    Additional Comments: Pt not eating well. MD ? Need for possible TPN. Pt receiving dialysis on as needed basis according to bloodwork. Will continue to follow for discharge planning needs. Christinia Gully [place], RN 11/21/2015, 1:15 PM

## 2015-11-21 NOTE — Progress Notes (Signed)
Subjective: She still doesn't feel well. She is not eating. She complains of headache and she's a little confused again. She says her leg is doing better.  Objective: Vital signs in last 24 hours: Temp:  [97.8 F (36.6 C)-98.1 F (36.7 C)] 98.1 F (36.7 C) (12/02 0551) Pulse Rate:  [60-141] 60 (12/02 0551) Resp:  [16-24] 16 (12/01 2144) BP: (138-159)/(75-83) 147/76 mmHg (12/02 0551) SpO2:  [91 %-100 %] 99 % (12/02 0551) Weight:  [198.222 kg (437 lb)] 198.222 kg (437 lb) (12/02 0551) Weight change: 33.566 kg (74 lb) Last BM Date: 11/18/15  Intake/Output from previous day: 12/01 0701 - 12/02 0700 In: 200 [P.O.:200] Out: 875 [Urine:875]  PHYSICAL EXAM General appearance: alert, mild distress and morbidly obese Resp: clear to auscultation bilaterally Cardio: regular rate and rhythm, S1, S2 normal, no murmur, click, rub or gallop GI: soft, non-tender; bowel sounds normal; no masses,  no organomegaly Extremities: She has chronic lymphedema but her cellulitis is improving  Lab Results:  Results for orders placed or performed during the hospital encounter of 11/13/15 (from the past 48 hour(s))  Glucose, capillary     Status: None   Collection Time: 11/19/15 11:24 AM  Result Value Ref Range   Glucose-Capillary 76 65 - 99 mg/dL   Comment 1 Notify RN    Comment 2 Document in Chart   Glucose, capillary     Status: None   Collection Time: 11/19/15  4:18 PM  Result Value Ref Range   Glucose-Capillary 91 65 - 99 mg/dL   Comment 1 Notify RN    Comment 2 Document in Chart   Glucose, capillary     Status: None   Collection Time: 11/19/15  9:31 PM  Result Value Ref Range   Glucose-Capillary 81 65 - 99 mg/dL   Comment 1 Notify RN   Basic metabolic panel     Status: Abnormal   Collection Time: 11/20/15  5:10 AM  Result Value Ref Range   Sodium 135 135 - 145 mmol/L   Potassium 5.1 3.5 - 5.1 mmol/L   Chloride 98 (L) 101 - 111 mmol/L   CO2 29 22 - 32 mmol/L   Glucose, Bld 84 65 - 99  mg/dL   BUN 50 (H) 6 - 20 mg/dL   Creatinine, Ser 4.08 (H) 0.44 - 1.00 mg/dL   Calcium 8.1 (L) 8.9 - 10.3 mg/dL   GFR calc non Af Amer 12 (L) >60 mL/min   GFR calc Af Amer 14 (L) >60 mL/min    Comment: (NOTE) The eGFR has been calculated using the CKD EPI equation. This calculation has not been validated in all clinical situations. eGFR's persistently <60 mL/min signify possible Chronic Kidney Disease.    Anion gap 8 5 - 15  Glucose, capillary     Status: None   Collection Time: 11/20/15  7:36 AM  Result Value Ref Range   Glucose-Capillary 80 65 - 99 mg/dL   Comment 1 Notify RN    Comment 2 Document in Chart   Glucose, capillary     Status: None   Collection Time: 11/20/15 11:44 AM  Result Value Ref Range   Glucose-Capillary 90 65 - 99 mg/dL   Comment 1 Notify RN    Comment 2 Document in Chart   Glucose, capillary     Status: None   Collection Time: 11/20/15  4:03 PM  Result Value Ref Range   Glucose-Capillary 92 65 - 99 mg/dL   Comment 1 Notify RN  Comment 2 Document in Chart   Glucose, capillary     Status: None   Collection Time: 11/20/15  9:40 PM  Result Value Ref Range   Glucose-Capillary 95 65 - 99 mg/dL   Comment 1 Notify RN    Comment 2 Document in Chart   Comprehensive metabolic panel     Status: Abnormal   Collection Time: 11/21/15  6:27 AM  Result Value Ref Range   Sodium 135 135 - 145 mmol/L   Potassium 5.3 (H) 3.5 - 5.1 mmol/L   Chloride 95 (L) 101 - 111 mmol/L   CO2 28 22 - 32 mmol/L   Glucose, Bld 108 (H) 65 - 99 mg/dL   BUN 58 (H) 6 - 20 mg/dL   Creatinine, Ser 4.25 (H) 0.44 - 1.00 mg/dL   Calcium 8.4 (L) 8.9 - 10.3 mg/dL   Total Protein 7.5 6.5 - 8.1 g/dL   Albumin 3.1 (L) 3.5 - 5.0 g/dL   AST 21 15 - 41 U/L   ALT 24 14 - 54 U/L   Alkaline Phosphatase 110 38 - 126 U/L   Total Bilirubin 1.4 (H) 0.3 - 1.2 mg/dL   GFR calc non Af Amer 11 (L) >60 mL/min   GFR calc Af Amer 13 (L) >60 mL/min    Comment: (NOTE) The eGFR has been calculated using  the CKD EPI equation. This calculation has not been validated in all clinical situations. eGFR's persistently <60 mL/min signify possible Chronic Kidney Disease.    Anion gap 12 5 - 15  Glucose, capillary     Status: Abnormal   Collection Time: 11/21/15  7:53 AM  Result Value Ref Range   Glucose-Capillary 111 (H) 65 - 99 mg/dL   Comment 1 Notify RN    Comment 2 Document in Chart     ABGS  Recent Labs  11/18/15 0930  PHART 7.172*  PO2ART 73.3*  HCO3 22.8   CULTURES Recent Results (from the past 240 hour(s))  Blood culture (routine x 2)     Status: None   Collection Time: 11/13/15  3:31 PM  Result Value Ref Range Status   Specimen Description BLOOD LEFT ARM  Final   Special Requests   Final    BOTTLES DRAWN AEROBIC AND ANAEROBIC AEB=10CC ANA=6CC   Culture NO GROWTH 5 DAYS  Final   Report Status 11/18/2015 FINAL  Final  Blood culture (routine x 2)     Status: None   Collection Time: 11/13/15  3:37 PM  Result Value Ref Range Status   Specimen Description BLOOD RIGHT ARM  Final   Special Requests   Final    BOTTLES DRAWN AEROBIC AND ANAEROBIC AEB 6CC ANA 4CC   Culture NO GROWTH 5 DAYS  Final   Report Status 11/18/2015 FINAL  Final  MRSA PCR Screening     Status: None   Collection Time: 11/18/15 11:45 AM  Result Value Ref Range Status   MRSA by PCR NEGATIVE NEGATIVE Final    Comment:        The GeneXpert MRSA Assay (FDA approved for NASAL specimens only), is one component of a comprehensive MRSA colonization surveillance program. It is not intended to diagnose MRSA infection nor to guide or monitor treatment for MRSA infections.    Studies/Results: No results found.  Medications:  Prior to Admission:  Prescriptions prior to admission  Medication Sig Dispense Refill Last Dose  . ALPRAZolam (XANAX) 0.5 MG tablet Take 0.5 mg by mouth 3 (three) times daily as  needed for anxiety or sleep.    11/12/2015 at Unknown time  . amLODipine (NORVASC) 10 MG tablet Take 10  mg by mouth daily.   11/12/2015 at Unknown time  . insulin NPH-insulin regular (NOVOLIN 70/30) (70-30) 100 UNIT/ML injection Inject 60 Units into the skin 2 (two) times daily.   11/12/2015 at Unknown time  . metoprolol (LOPRESSOR) 50 MG tablet Take 50 mg by mouth 2 (two) times daily.   11/12/2015 at Unknown time  . Oxycodone HCl 10 MG TABS Take 10 mg by mouth 4 (four) times daily as needed (pain).    Past Week at Unknown time  . potassium chloride SA (K-DUR,KLOR-CON) 20 MEQ tablet Take 20 mEq by mouth 3 (three) times daily.   11/12/2015 at Unknown time  . torsemide (DEMADEX) 100 MG tablet Take 100 mg by mouth 2 (two) times daily.    11/12/2015 at Unknown time  . acetaminophen (TYLENOL) 325 MG tablet Take 650 mg by mouth every 6 (six) hours as needed. fever   unknown  . diphenhydrAMINE (BENADRYL) 25 mg capsule Take 25 mg by mouth every 6 (six) hours as needed for allergies.   unknown  . docusate sodium (COLACE) 100 MG capsule Take 100 mg by mouth 2 (two) times daily as needed. For constipation   unknown  . HYDROcodone-acetaminophen (NORCO/VICODIN) 5-325 MG per tablet Take 2 tablets by mouth every 4 (four) hours as needed. (Patient not taking: Reported on 12/02/2014) 10 tablet 0   . oseltamivir (TAMIFLU) 75 MG capsule Take 1 capsule (75 mg total) by mouth every 12 (twelve) hours. (Patient not taking: Reported on 12/02/2014) 10 capsule 0   . oxyCODONE-acetaminophen (PERCOCET/ROXICET) 5-325 MG per tablet Take 1 tablet by mouth every 6 (six) hours as needed for severe pain. 15 tablet 0 unknown   Scheduled: . clindamycin (CLEOCIN) IV  600 mg Intravenous 4 times per day  . enoxaparin (LOVENOX) injection  30 mg Subcutaneous Q24H  . furosemide  200 mg Intravenous BID  . insulin aspart  0-20 Units Subcutaneous TID WC  . insulin aspart  0-5 Units Subcutaneous QHS  . metolazone  5 mg Oral BID  . metoprolol  50 mg Oral BID  . nystatin   Topical BID  . ondansetron (ZOFRAN) IV  4 mg Intravenous 4 times per  day  . sodium chloride  3 mL Intravenous Q12H  . sodium chloride  3 mL Intravenous Q12H   Continuous: . sodium chloride 75 mL/hr (11/21/15 0003)   OJJ:[id number] chloride, sodium chloride, sodium chloride, sodium chloride, sodium chloride, acetaminophen **OR** acetaminophen, acetaminophen, ALPRAZolam, alteplase, alteplase, diphenhydrAMINE, lidocaine (PF), lidocaine-prilocaine, ondansetron (ZOFRAN) IV, oxyCODONE, pentafluoroprop-tetrafluoroeth, sodium chloride, sodium chloride, sodium chloride  Assesment: She was admitted with cellulitis of the leg. This is related to chronic lymphedema. She has multiple other medical problems including morbid obesity obesity hypoventilation and chronic hypoxic respiratory failure. She is still requiring oxygen. She has been confused and I think that the combination of hypoxemia/hypercapnia and uremia. Although her urine output has improved her creatinine has not and she is going to have dialysis today. Her cellulitis is better but I'd like her to have 2 more days of IV antibiotics considering her severe lymphedema. She has morbid obesity which is going to complicate her treatments. Principal Problem:   Cellulitis of leg without foot, right Active Problems:   Lymphedema of lower extremity   Diabetes mellitus (HCC)   Morbid obesity (HCC)   Hyperkalemia   Obesity hypoventilation syndrome (HCC)   Chronic diastolic  CHF (congestive heart failure) (HCC)   Chronic respiratory failure with hypoxia (HCC)   CKD (chronic kidney disease), stage III   Anemia of chronic disease   Acute on chronic respiratory failure ([place])    Plan: Continue treatment. Plan for dialysis. I'm going to have her get scheduled medications for nausea. Have her use BiPAP.    LOS: 8 days   Linzi Ohlinger L 11/21/2015, 8:54 AM

## 2015-11-21 NOTE — Progress Notes (Signed)
Subjective: Patient complains of not feeling good. Patient is still with some nausea but no vomiting. Her breathing is okay.  Objective: Vital signs in last 24 hours: Temp:  [97.8 F (36.6 C)-98.1 F (36.7 C)] 98.1 F (36.7 C) (12/02 0551) Pulse Rate:  [60-141] 60 (12/02 0551) Resp:  [16-24] 16 (12/01 2144) BP: (138-159)/(75-83) 147/76 mmHg (12/02 0551) SpO2:  [91 %-100 %] 99 % (12/02 0551) Weight:  [437 lb (198.222 kg)] 437 lb (198.222 kg) (12/02 0551)  Intake/Output from previous day: 12/01 0701 - 12/02 0700 In: 200 [P.O.:200] Out: 875 [Urine:875] Intake/Output this shift:     Recent Labs  11/19/15 0800  HGB 10.7*    Recent Labs  11/19/15 0800  WBC 8.6  RBC 3.81*  HCT 33.9*  PLT 234    Recent Labs  11/20/15 0510 11/21/15 0627  NA 135 135  K 5.1 5.3*  CL 98* 95*  CO2 29 28  BUN 50* 58*  CREATININE 4.08* 4.25*  GLUCOSE 84 108*  CALCIUM 8.1* 8.4*   No results for input(s): LABPT, INR in the last 72 hours.  Generally patient is alert and in no apparent distress Chest decreased breath sound. Heart exam regular rate and rhythm Abdomen obese Extremities patient with chronic lymphedema   Assessment/Plan:  Problem #1 acute kidney injury superimposed on chronic. Her BUN and creatinine continued to increase. Patient is nonoliguric however her urine output doesn't seem to be increasing. Still she has about 900 mL of urine output while she on Lasix and metolazone. She is on 5 L of oxygen. Problem #2 hyperkalemia: Her potassium 5.3 slowly increasing Problem #3 diabetes: Her blood sugar is reasonably controlled. Problem #4 morbid obesity: Presently she is bed bound. Problem #5 chronic renal failure: Stage III Problem #6 anemia: Her hemoglobin is stable. Problem #7 cellulitis of her left foot. Patient is on antibiotics: She is a febrile and her white blood cell count is normal. Problem #8 nausea and vomiting. This could be secondary to diabetic gastropathy.  Patient with some nausea but no vomiting. She claims she is slightly better. Plan:1] We'll make arrangements for dialysis today for 4 hours. 2] we will remove 3-1/2 L and her blood pressure tolerates. 3]We'll check her basic metabolic panel in the morning     [facility] S 11/21/2015, 8:38 AM

## 2015-11-22 LAB — GLUCOSE, CAPILLARY
GLUCOSE-CAPILLARY: 123 mg/dL — AB (ref 65–99)
Glucose-Capillary: 128 mg/dL — ABNORMAL HIGH (ref 65–99)

## 2015-11-22 MED ORDER — METOCLOPRAMIDE HCL 5 MG/ML IJ SOLN
5.0000 mg | Freq: Three times a day (TID) | INTRAMUSCULAR | Status: DC
  Administered 2015-11-22 – 2015-11-28 (×17): 5 mg via INTRAVENOUS
  Filled 2015-11-22: qty 2
  Filled 2015-11-22: qty 1
  Filled 2015-11-22 (×6): qty 2
  Filled 2015-11-22 (×2): qty 1
  Filled 2015-11-22 (×11): qty 2

## 2015-11-22 MED ORDER — CLINDAMYCIN PHOSPHATE 600 MG/50ML IV SOLN
INTRAVENOUS | Status: AC
  Filled 2015-11-22: qty 50

## 2015-11-22 MED ORDER — NYSTATIN 100000 UNIT/GM EX POWD
CUTANEOUS | Status: AC
  Filled 2015-11-22: qty 15

## 2015-11-22 NOTE — Progress Notes (Signed)
Subjective: She's a little bit better this morning. She is a little bit less confused but still was confused last night. She was able to eat a little bit this morning. Her renal function has not improved  Objective: Vital signs in last 24 hours: Temp:  [97.6 F (36.4 C)-97.8 F (36.6 C)] 97.7 F (36.5 C) (12/03 0656) Pulse Rate:  [57-68] 61 (12/03 0656) Resp:  [16-20] 20 (12/03 0656) BP: (135-166)/(59-85) 137/69 mmHg (12/03 0656) SpO2:  [94 %-100 %] 97 % (12/03 0656) Weight:  [196.408 kg (433 lb)-200.036 kg (441 lb)] 196.408 kg (433 lb) (12/02 1710) Weight change: 1.814 kg (4 lb) Last BM Date: 11/18/15  Intake/Output from previous day: 12/02 0701 - 12/03 0700 In: -  Out: 4200 [Urine:400]  PHYSICAL EXAM General appearance: alert, cooperative, mild distress and morbidly obese Resp: clear to auscultation bilaterally Cardio: regular rate and rhythm, S1, S2 normal, no murmur, click, rub or gallop GI: soft, non-tender; bowel sounds normal; no masses,  no organomegaly Extremities: She has lymphedema of both legs but the cellulitis of her right lower leg is better  Lab Results:  Results for orders placed or performed during the hospital encounter of 11/13/15 (from the past 48 hour(s))  Glucose, capillary     Status: None   Collection Time: 11/20/15 11:44 AM  Result Value Ref Range   Glucose-Capillary 90 65 - 99 mg/dL   Comment 1 Notify RN    Comment 2 Document in Chart   Glucose, capillary     Status: None   Collection Time: 11/20/15  4:03 PM  Result Value Ref Range   Glucose-Capillary 92 65 - 99 mg/dL   Comment 1 Notify RN    Comment 2 Document in Chart   Glucose, capillary     Status: None   Collection Time: 11/20/15  9:40 PM  Result Value Ref Range   Glucose-Capillary 95 65 - 99 mg/dL   Comment 1 Notify RN    Comment 2 Document in Chart   Comprehensive metabolic panel     Status: Abnormal   Collection Time: 11/21/15  6:27 AM  Result Value Ref Range   Sodium 135 135 -  145 mmol/L   Potassium 5.3 (H) 3.5 - 5.1 mmol/L   Chloride 95 (L) 101 - 111 mmol/L   CO2 28 22 - 32 mmol/L   Glucose, Bld 108 (H) 65 - 99 mg/dL   BUN 58 (H) 6 - 20 mg/dL   Creatinine, Ser 4.25 (H) 0.44 - 1.00 mg/dL   Calcium 8.4 (L) 8.9 - 10.3 mg/dL   Total Protein 7.5 6.5 - 8.1 g/dL   Albumin 3.1 (L) 3.5 - 5.0 g/dL   AST 21 15 - 41 U/L   ALT 24 14 - 54 U/L   Alkaline Phosphatase 110 38 - 126 U/L   Total Bilirubin 1.4 (H) 0.3 - 1.2 mg/dL   GFR calc non Af Amer 11 (L) >60 mL/min   GFR calc Af Amer 13 (L) >60 mL/min    Comment: (NOTE) The eGFR has been calculated using the CKD EPI equation. This calculation has not been validated in all clinical situations. eGFR's persistently <60 mL/min signify possible Chronic Kidney Disease.    Anion gap 12 5 - 15  Glucose, capillary     Status: Abnormal   Collection Time: 11/21/15  7:53 AM  Result Value Ref Range   Glucose-Capillary 111 (H) 65 - 99 mg/dL   Comment 1 Notify RN    Comment 2 Document in  Chart   CBC     Status: Abnormal   Collection Time: 11/21/15 11:34 AM  Result Value Ref Range   WBC 11.4 (H) 4.0 - 10.5 K/uL   RBC 3.69 (L) 3.87 - 5.11 MIL/uL   Hemoglobin 10.5 (L) 12.0 - 15.0 g/dL   HCT 32.0 (L) 36.0 - 46.0 %   MCV 86.7 78.0 - 100.0 fL   MCH 28.5 26.0 - 34.0 pg   MCHC 32.8 30.0 - 36.0 g/dL   RDW 17.0 (H) 11.5 - 15.5 %   Platelets 216 150 - 400 K/uL  Glucose, capillary     Status: Abnormal   Collection Time: 11/21/15 11:37 AM  Result Value Ref Range   Glucose-Capillary 121 (H) 65 - 99 mg/dL   Comment 1 Notify RN    Comment 2 Document in Chart   Glucose, capillary     Status: None   Collection Time: 11/21/15  9:56 PM  Result Value Ref Range   Glucose-Capillary 89 65 - 99 mg/dL   Comment 1 Notify RN    Comment 2 Document in Chart     ABGS No results for input(s): PHART, PO2ART, TCO2, HCO3 in the last 72 hours.  Invalid input(s): PCO2 CULTURES Recent Results (from the past 240 hour(s))  Blood culture (routine x  2)     Status: None   Collection Time: 11/13/15  3:31 PM  Result Value Ref Range Status   Specimen Description BLOOD LEFT ARM  Final   Special Requests   Final    BOTTLES DRAWN AEROBIC AND ANAEROBIC AEB=10CC ANA=6CC   Culture NO GROWTH 5 DAYS  Final   Report Status 11/18/2015 FINAL  Final  Blood culture (routine x 2)     Status: None   Collection Time: 11/13/15  3:37 PM  Result Value Ref Range Status   Specimen Description BLOOD RIGHT ARM  Final   Special Requests   Final    BOTTLES DRAWN AEROBIC AND ANAEROBIC AEB 6CC ANA 4CC   Culture NO GROWTH 5 DAYS  Final   Report Status 11/18/2015 FINAL  Final  MRSA PCR Screening     Status: None   Collection Time: 11/18/15 11:45 AM  Result Value Ref Range Status   MRSA by PCR NEGATIVE NEGATIVE Final    Comment:        The GeneXpert MRSA Assay (FDA approved for NASAL specimens only), is one component of a comprehensive MRSA colonization surveillance program. It is not intended to diagnose MRSA infection nor to guide or monitor treatment for MRSA infections.    Studies/Results: No results found.  Medications:  Prior to Admission:  Prescriptions prior to admission  Medication Sig Dispense Refill Last Dose  . ALPRAZolam (XANAX) 0.5 MG tablet Take 0.5 mg by mouth 3 (three) times daily as needed for anxiety or sleep.    11/12/2015 at Unknown time  . amLODipine (NORVASC) 10 MG tablet Take 10 mg by mouth daily.   11/12/2015 at Unknown time  . insulin NPH-insulin regular (NOVOLIN 70/30) (70-30) 100 UNIT/ML injection Inject 60 Units into the skin 2 (two) times daily.   11/12/2015 at Unknown time  . metoprolol (LOPRESSOR) 50 MG tablet Take 50 mg by mouth 2 (two) times daily.   11/12/2015 at Unknown time  . Oxycodone HCl 10 MG TABS Take 10 mg by mouth 4 (four) times daily as needed (pain).    Past Week at Unknown time  . potassium chloride SA (K-DUR,KLOR-CON) 20 MEQ tablet Take 20 mEq by  mouth 3 (three) times daily.   11/12/2015 at Unknown time   . torsemide (DEMADEX) 100 MG tablet Take 100 mg by mouth 2 (two) times daily.    11/12/2015 at Unknown time  . acetaminophen (TYLENOL) 325 MG tablet Take 650 mg by mouth every 6 (six) hours as needed. fever   unknown  . diphenhydrAMINE (BENADRYL) 25 mg capsule Take 25 mg by mouth every 6 (six) hours as needed for allergies.   unknown  . docusate sodium (COLACE) 100 MG capsule Take 100 mg by mouth 2 (two) times daily as needed. For constipation   unknown  . HYDROcodone-acetaminophen (NORCO/VICODIN) 5-325 MG per tablet Take 2 tablets by mouth every 4 (four) hours as needed. (Patient not taking: Reported on 12/02/2014) 10 tablet 0   . oseltamivir (TAMIFLU) 75 MG capsule Take 1 capsule (75 mg total) by mouth every 12 (twelve) hours. (Patient not taking: Reported on 12/02/2014) 10 capsule 0   . oxyCODONE-acetaminophen (PERCOCET/ROXICET) 5-325 MG per tablet Take 1 tablet by mouth every 6 (six) hours as needed for severe pain. 15 tablet 0 unknown   Scheduled: . clindamycin (CLEOCIN) IV  600 mg Intravenous 4 times per day  . enoxaparin (LOVENOX) injection  30 mg Subcutaneous Q24H  . furosemide  200 mg Intravenous BID  . insulin aspart  0-20 Units Subcutaneous TID WC  . insulin aspart  0-5 Units Subcutaneous QHS  . metoCLOPramide (REGLAN) injection  5 mg Intravenous 3 times per day  . metolazone  5 mg Oral BID  . metoprolol  50 mg Oral BID  . nystatin   Topical BID  . ondansetron (ZOFRAN) IV  4 mg Intravenous 4 times per day  . sodium chloride  3 mL Intravenous Q12H  . sodium chloride  3 mL Intravenous Q12H   Continuous: . sodium chloride 75 mL/hr at 11/22/15 0502   ASN:[id number] chloride, sodium chloride, sodium chloride, sodium chloride, sodium chloride, sodium chloride, sodium chloride, acetaminophen **OR** acetaminophen, acetaminophen, ALPRAZolam, alteplase, alteplase, diphenhydrAMINE, heparin, lidocaine (PF), lidocaine-prilocaine, ondansetron (ZOFRAN) IV, oxyCODONE,  pentafluoroprop-tetrafluoroeth, sodium chloride, sodium chloride, sodium chloride  Assesment: She was admitted with cellulitis of her leg. This is associated with her chronic lymphedema. She has developed acute on chronic renal failure and her renal function is not improving. She had acute on chronic respiratory failure which is better. She still has some confusion that I think is multifactorial but I would like her to get BiPAP at night and see if that helps. She has morbid obesity. She has diabetes at baseline is not been controlled at home but is better here. She is not eating well also nutrition is getting to be a problem Principal Problem:   Cellulitis of leg without foot, right Active Problems:   Lymphedema of lower extremity   Diabetes mellitus (HCC)   Morbid obesity (HCC)   Hyperkalemia   Obesity hypoventilation syndrome (HCC)   Chronic diastolic CHF (congestive heart failure) (HCC)   Chronic respiratory failure with hypoxia (HCC)   CKD (chronic kidney disease), stage III   Anemia of chronic disease   Acute on chronic respiratory failure ([place])    Plan: I'm going to add Reglan. GI consult tomorrow. PICC line because were not going to be able to give her TPN through her current central line if she needs that. It looks like she may require more at least moderate term dialysis.    LOS: 9 days   Stefanee Mckell L 11/22/2015, 10:40 AM

## 2015-11-22 NOTE — Progress Notes (Signed)
Subjective: Patient complains of still nausea and vomiting. Patient says that she is not able to eat. She denies any difficulty breathing.  Objective: Vital signs in last 24 hours: Temp:  [97.6 F (36.4 C)-97.8 F (36.6 C)] 97.7 F (36.5 C) (12/03 0656) Pulse Rate:  [57-68] 61 (12/03 0656) Resp:  [16-20] 20 (12/03 0656) BP: (135-166)/(59-85) 137/69 mmHg (12/03 0656) SpO2:  [94 %-100 %] 97 % (12/03 0656) Weight:  [433 lb (196.408 kg)-441 lb (200.036 kg)] 433 lb (196.408 kg) (12/02 1710)  Intake/Output from previous day: 12/02 0701 - 12/03 0700 In: -  Out: 4200 [Urine:400] Intake/Output this shift:     Recent Labs  11/21/15 1134  HGB 10.5*    Recent Labs  11/21/15 1134  WBC 11.4*  RBC 3.69*  HCT 32.0*  PLT 216    Recent Labs  11/20/15 0510 11/21/15 0627  NA 135 135  K 5.1 5.3*  CL 98* 95*  CO2 29 28  BUN 50* 58*  CREATININE 4.08* 4.25*  GLUCOSE 84 108*  CALCIUM 8.1* 8.4*   No results for input(s): LABPT, INR in the last 72 hours.  Patient presently gagging and throwing up. She was given Zofran without much improvement. Chest decreased breath sound. Heart exam regular rate and rhythm Abdomen : obese and nontender. Extremities patient with chronic lymphedema   Assessment/Plan:  Problem #1 acute kidney injury superimposed on chronic. Patient status post hemodialysis yesterday for 4 hours. She continued to have recurrent nausea and vomiting. Problem #2 hyperkalemia: Her potassium 5.3 yesterday before dialysis. No additional blood work today Problem #3 diabetes: Her blood sugar is reasonably controlled. Problem #4 morbid obesity: Presently she is bed bound.  Problem #5 chronic renal failure: Stage III Problem #6 anemia: Her hemoglobin is low but stable. Problem #7 cellulitis of her left foot. Patient is on antibiotics: She is a febrile and her white blood cell count is normal. Problem #8 nausea and vomiting. This could be secondary to diabetic gastropathy.  No significant change. Plan:1] patient does require dialysis today 2]We'll check her basic metabolic panel , phosphorus and CBC in the morning     Angline Schweigert S 11/22/2015, 9:29 AM

## 2015-11-23 LAB — CBC
HCT: 31.9 % — ABNORMAL LOW (ref 36.0–46.0)
HEMOGLOBIN: 10.3 g/dL — AB (ref 12.0–15.0)
MCH: 28.2 pg (ref 26.0–34.0)
MCHC: 32.3 g/dL (ref 30.0–36.0)
MCV: 87.4 fL (ref 78.0–100.0)
Platelets: 225 10*3/uL (ref 150–400)
RBC: 3.65 MIL/uL — AB (ref 3.87–5.11)
RDW: 18 % — ABNORMAL HIGH (ref 11.5–15.5)
WBC: 8.1 10*3/uL (ref 4.0–10.5)

## 2015-11-23 LAB — GLUCOSE, CAPILLARY
GLUCOSE-CAPILLARY: 117 mg/dL — AB (ref 65–99)
GLUCOSE-CAPILLARY: 122 mg/dL — AB (ref 65–99)
Glucose-Capillary: 114 mg/dL — ABNORMAL HIGH (ref 65–99)
Glucose-Capillary: 105 mg/dL — ABNORMAL HIGH (ref 65–99)
Glucose-Capillary: 123 mg/dL — ABNORMAL HIGH (ref 65–99)

## 2015-11-23 LAB — BASIC METABOLIC PANEL
ANION GAP: 12 (ref 5–15)
BUN: 54 mg/dL — ABNORMAL HIGH (ref 6–20)
CALCIUM: 8.6 mg/dL — AB (ref 8.9–10.3)
CHLORIDE: 97 mmol/L — AB (ref 101–111)
CO2: 26 mmol/L (ref 22–32)
CREATININE: 3.82 mg/dL — AB (ref 0.44–1.00)
GFR calc non Af Amer: 13 mL/min — ABNORMAL LOW (ref >60)
GFR, EST AFRICAN AMERICAN: 15 mL/min — AB (ref >60)
GLUCOSE: 110 mg/dL — AB (ref 65–99)
Potassium: 4.7 mmol/L (ref 3.5–5.1)
Sodium: 135 mmol/L (ref 135–145)

## 2015-11-23 LAB — PHOSPHORUS: Phosphorus: 5.7 mg/dL — ABNORMAL HIGH (ref 2.5–4.6)

## 2015-11-23 NOTE — Progress Notes (Signed)
Late entry:  Notified Dr. Lowanda Foster today that the IV nurse asked me to clear with him due to her creatinine if she could have a PICC placed.  He stated that it would be okay to be placed.  This was voiced to Richarda Osmond the IV nurse and the House supervisor for that time.  This was approx. 10:30 am today.

## 2015-11-23 NOTE — Progress Notes (Signed)
Subjective: She says she feels significantly better today. She has been eating and keeping food down. She is much less confused.  Objective: Vital signs in last 24 hours: Temp:  [97.7 F (36.5 C)-98.5 F (36.9 C)] 98.2 F (36.8 C) (12/04 0402) Pulse Rate:  [59-61] 61 (12/04 0402) Resp:  [18-20] 20 (12/04 0402) BP: (119-160)/(66-88) 119/66 mmHg (12/04 0402) SpO2:  [95 %-100 %] 95 % (12/04 0402) Weight change:  Last BM Date:  (pt does not remember)  Intake/Output from previous day: 12/03 0701 - 12/04 0700 In: 220 [P.O.:120; IV Piggyback:100] Out: 600 [Urine:600]  PHYSICAL EXAM General appearance: alert, cooperative, mild distress and morbidly obese Resp: clear to auscultation bilaterally Cardio: regular rate and rhythm, S1, S2 normal, no murmur, click, rub or gallop GI: soft, non-tender; bowel sounds normal; no masses,  no organomegaly Extremities: She has significant lymphedema but her cellulitis is better. Today will be the last day of IV antibiotics  Lab Results:  Results for orders placed or performed during the hospital encounter of 11/13/15 (from the past 48 hour(s))  CBC     Status: Abnormal   Collection Time: 11/21/15 11:34 AM  Result Value Ref Range   WBC 11.4 (H) 4.0 - 10.5 K/uL   RBC 3.69 (L) 3.87 - 5.11 MIL/uL   Hemoglobin 10.5 (L) 12.0 - 15.0 g/dL   HCT 32.0 (L) 36.0 - 46.0 %   MCV 86.7 78.0 - 100.0 fL   MCH 28.5 26.0 - 34.0 pg   MCHC 32.8 30.0 - 36.0 g/dL   RDW 17.0 (H) 11.5 - 15.5 %   Platelets 216 150 - 400 K/uL  Glucose, capillary     Status: Abnormal   Collection Time: 11/21/15 11:37 AM  Result Value Ref Range   Glucose-Capillary 121 (H) 65 - 99 mg/dL   Comment 1 Notify RN    Comment 2 Document in Chart   Glucose, capillary     Status: None   Collection Time: 11/21/15  9:56 PM  Result Value Ref Range   Glucose-Capillary 89 65 - 99 mg/dL   Comment 1 Notify RN    Comment 2 Document in Chart   Glucose, capillary     Status: Abnormal   Collection  Time: 11/22/15 12:04 PM  Result Value Ref Range   Glucose-Capillary 123 (H) 65 - 99 mg/dL  Glucose, capillary     Status: Abnormal   Collection Time: 11/22/15  5:21 PM  Result Value Ref Range   Glucose-Capillary 128 (H) 65 - 99 mg/dL   Comment 1 Notify RN    Comment 2 Document in Chart   Glucose, capillary     Status: Abnormal   Collection Time: 11/22/15 10:11 PM  Result Value Ref Range   Glucose-Capillary 114 (H) 65 - 99 mg/dL   Comment 1 Notify RN   Phosphorus     Status: Abnormal   Collection Time: 11/23/15  6:08 AM  Result Value Ref Range   Phosphorus 5.7 (H) 2.5 - 4.6 mg/dL  CBC     Status: Abnormal   Collection Time: 11/23/15  6:08 AM  Result Value Ref Range   WBC 8.1 4.0 - 10.5 K/uL   RBC 3.65 (L) 3.87 - 5.11 MIL/uL   Hemoglobin 10.3 (L) 12.0 - 15.0 g/dL   HCT 31.9 (L) 36.0 - 46.0 %   MCV 87.4 78.0 - 100.0 fL   MCH 28.2 26.0 - 34.0 pg   MCHC 32.3 30.0 - 36.0 g/dL   RDW 18.0 (H) 11.5 -  15.5 %   Platelets 225 150 - 400 K/uL  Glucose, capillary     Status: Abnormal   Collection Time: 11/23/15  8:14 AM  Result Value Ref Range   Glucose-Capillary 105 (H) 65 - 99 mg/dL    ABGS No results for input(s): PHART, PO2ART, TCO2, HCO3 in the last 72 hours.  Invalid input(s): PCO2 CULTURES Recent Results (from the past 240 hour(s))  Blood culture (routine x 2)     Status: None   Collection Time: 11/13/15  3:31 PM  Result Value Ref Range Status   Specimen Description BLOOD LEFT ARM  Final   Special Requests   Final    BOTTLES DRAWN AEROBIC AND ANAEROBIC AEB=10CC ANA=6CC   Culture NO GROWTH 5 DAYS  Final   Report Status 11/18/2015 FINAL  Final  Blood culture (routine x 2)     Status: None   Collection Time: 11/13/15  3:37 PM  Result Value Ref Range Status   Specimen Description BLOOD RIGHT ARM  Final   Special Requests   Final    BOTTLES DRAWN AEROBIC AND ANAEROBIC AEB 6CC ANA 4CC   Culture NO GROWTH 5 DAYS  Final   Report Status 11/18/2015 FINAL  Final  MRSA PCR  Screening     Status: None   Collection Time: 11/18/15 11:45 AM  Result Value Ref Range Status   MRSA by PCR NEGATIVE NEGATIVE Final    Comment:        The GeneXpert MRSA Assay (FDA approved for NASAL specimens only), is one component of a comprehensive MRSA colonization surveillance program. It is not intended to diagnose MRSA infection nor to guide or monitor treatment for MRSA infections.    Studies/Results: No results found.  Medications:  Prior to Admission:  Prescriptions prior to admission  Medication Sig Dispense Refill Last Dose  . ALPRAZolam (XANAX) 0.5 MG tablet Take 0.5 mg by mouth 3 (three) times daily as needed for anxiety or sleep.    11/12/2015 at Unknown time  . amLODipine (NORVASC) 10 MG tablet Take 10 mg by mouth daily.   11/12/2015 at Unknown time  . insulin NPH-insulin regular (NOVOLIN 70/30) (70-30) 100 UNIT/ML injection Inject 60 Units into the skin 2 (two) times daily.   11/12/2015 at Unknown time  . metoprolol (LOPRESSOR) 50 MG tablet Take 50 mg by mouth 2 (two) times daily.   11/12/2015 at Unknown time  . Oxycodone HCl 10 MG TABS Take 10 mg by mouth 4 (four) times daily as needed (pain).    Past Week at Unknown time  . potassium chloride SA (K-DUR,KLOR-CON) 20 MEQ tablet Take 20 mEq by mouth 3 (three) times daily.   11/12/2015 at Unknown time  . torsemide (DEMADEX) 100 MG tablet Take 100 mg by mouth 2 (two) times daily.    11/12/2015 at Unknown time  . acetaminophen (TYLENOL) 325 MG tablet Take 650 mg by mouth every 6 (six) hours as needed. fever   unknown  . diphenhydrAMINE (BENADRYL) 25 mg capsule Take 25 mg by mouth every 6 (six) hours as needed for allergies.   unknown  . docusate sodium (COLACE) 100 MG capsule Take 100 mg by mouth 2 (two) times daily as needed. For constipation   unknown  . HYDROcodone-acetaminophen (NORCO/VICODIN) 5-325 MG per tablet Take 2 tablets by mouth every 4 (four) hours as needed. (Patient not taking: Reported on 12/02/2014) 10  tablet 0   . oseltamivir (TAMIFLU) 75 MG capsule Take 1 capsule (75 mg total) by mouth  every 12 (twelve) hours. (Patient not taking: Reported on 12/02/2014) 10 capsule 0   . oxyCODONE-acetaminophen (PERCOCET/ROXICET) 5-325 MG per tablet Take 1 tablet by mouth every 6 (six) hours as needed for severe pain. 15 tablet 0 unknown   Scheduled: . clindamycin (CLEOCIN) IV  600 mg Intravenous 4 times per day  . enoxaparin (LOVENOX) injection  30 mg Subcutaneous Q24H  . furosemide  200 mg Intravenous BID  . insulin aspart  0-20 Units Subcutaneous TID WC  . insulin aspart  0-5 Units Subcutaneous QHS  . metoCLOPramide (REGLAN) injection  5 mg Intravenous 3 times per day  . metolazone  5 mg Oral BID  . metoprolol  50 mg Oral BID  . nystatin   Topical BID  . ondansetron (ZOFRAN) IV  4 mg Intravenous 4 times per day  . sodium chloride  3 mL Intravenous Q12H  . sodium chloride  3 mL Intravenous Q12H   Continuous: . sodium chloride 75 mL/hr at 11/22/15 0502   GMW:NUUVOZ chloride, sodium chloride, sodium chloride, sodium chloride, sodium chloride, sodium chloride, sodium chloride, acetaminophen **OR** acetaminophen, acetaminophen, ALPRAZolam, alteplase, alteplase, diphenhydrAMINE, heparin, lidocaine (PF), lidocaine-prilocaine, ondansetron (ZOFRAN) IV, oxyCODONE, pentafluoroprop-tetrafluoroeth, sodium chloride, sodium chloride, sodium chloride  Assesment: She was admitted with cellulitis of her leg. She has severe lymphedema at baseline. She has morbid obesity which is unchanged and obesity hypoventilation syndrome from that. She is much better after using BiPAP last night. She has developed acute on chronic renal failure and although she is making urine her renal function is not improving. She may require ongoing dialysis. She was significantly nauseated and that has improved also. I think some of this may be related to diabetic gastroparesis since she is much better with Reglan I had requested GI consultation  but I don't think we need that now Principal Problem:   Cellulitis of leg without foot, right Active Problems:   Lymphedema of lower extremity   Diabetes mellitus (HCC)   Morbid obesity (HCC)   Hyperkalemia   Obesity hypoventilation syndrome (HCC)   Chronic diastolic CHF (congestive heart failure) (HCC)   Chronic respiratory failure with hypoxia (HCC)   CKD (chronic kidney disease), stage III   Anemia of chronic disease   Acute on chronic respiratory failure ([place])    Plan: Continue current treatments. Discontinue IV antibiotics after today. Continue monitoring renal function. Continue BiPAP at night. She may require permacath placement.    LOS: 10 days   Anissa Abbs L 11/23/2015, 10:41 AM

## 2015-11-23 NOTE — Progress Notes (Signed)
UNable to get PICC wire to thread. Will attempt again tomorrow.

## 2015-11-23 NOTE — Progress Notes (Signed)
Late entry. 22:18 pt reported her gown was wet in her stomach area and did not know why. With me blocking the pt and the curtain closed more than midway, enough for no one to see her. She asked me "why was I lifting her gown and looking at her stomach area? And that she wanted the curtain completely closed" and asked for my name. She later apologized and said she gets confused at times.

## 2015-11-23 NOTE — Progress Notes (Signed)
Subjective: Patient states that she's feeling much better today. She has some nausea but no vomiting. And at this moment she seemed to tolerate feeding. Patient also denies any difficulty breathing and no orthopnea.  Objective: Vital signs in last 24 hours: Temp:  [97.7 F (36.5 C)-98.5 F (36.9 C)] 98.2 F (36.8 C) (12/04 0402) Pulse Rate:  [59-61] 61 (12/04 0402) Resp:  [18-20] 20 (12/04 0402) BP: (119-160)/(66-88) 119/66 mmHg (12/04 0402) SpO2:  [95 %-100 %] 95 % (12/04 0402)  Intake/Output from previous day: 12/03 0701 - 12/04 0700 In: 220 [P.O.:120; IV Piggyback:100] Out: 600 [Urine:600] Intake/Output this shift:     Recent Labs  11/21/15 1134 11/23/15 0608  HGB 10.5* 10.3*    Recent Labs  11/21/15 1134 11/23/15 0608  WBC 11.4* 8.1  RBC 3.69* 3.65*  HCT 32.0* 31.9*  PLT 216 225    Recent Labs  11/21/15 0627  NA 135  K 5.3*  CL 95*  CO2 28  BUN 58*  CREATININE 4.25*  GLUCOSE 108*  CALCIUM 8.4*   No results for input(s): LABPT, INR in the last 72 hours.  Patient presently alert and in no apparent distress. Chest decreased breath sound. Heart exam regular rate and rhythm Abdomen : obese and nontender. Extremities patient with chronic lymphedema   Assessment/Plan:  Problem #1 acute kidney injury superimposed on chronic. Patient status post hemodialysis on Friday. Her basic metabolic panel is pending. Patient is asymptomatic today. She has already 600 mL of urine output even though she is on high dose of Lasix. Problem #2 hyperkalemia: She is status post hemodialysis on Friday with low potassium baths. As stated above her blood work is pending today. Problem #3 diabetes: Her blood sugar is reasonably controlled. Problem #4 morbid obesity: Presently she is bed bound.  Problem #5 chronic renal failure: Stage III Problem #6 anemia: Her hemoglobin is low but stable. Problem #7 cellulitis of her left foot. Patient is on antibiotics: She is a febrile and  her white blood cell count is normal. Problem #8 nausea and vomiting. This could be secondary to diabetic gastropathy. No significant change. Plan:1] patient does require dialysis today 2] we'll follow her blood work and make a decision about dialysis tomorrow. 3] patient and her family's at this moment have a realistic expectation. Her son was asking me whether he can donate a kidney. I spent some time and explained to him that she is a poor candidate for surgery specially with severe morbid obesity. He was though I explained to him the difficulty of making an outpatient hemodialysis arrangement still they want to continue with dialysis. If patient continued to require dialysis then we may need to send her to please follow to have a tunneled catheter placed and removed femoral catheter this week. 4]We'll check her basic metabolic panel , phosphorus and CBC in the morning     [name] S 11/23/2015, 10:11 AM

## 2015-11-24 LAB — BASIC METABOLIC PANEL
ANION GAP: 9 (ref 5–15)
BUN: 59 mg/dL — ABNORMAL HIGH (ref 6–20)
CO2: 27 mmol/L (ref 22–32)
Calcium: 8.3 mg/dL — ABNORMAL LOW (ref 8.9–10.3)
Chloride: 100 mmol/L — ABNORMAL LOW (ref 101–111)
Creatinine, Ser: 3.91 mg/dL — ABNORMAL HIGH (ref 0.44–1.00)
GFR calc non Af Amer: 13 mL/min — ABNORMAL LOW (ref >60)
GFR, EST AFRICAN AMERICAN: 14 mL/min — AB (ref >60)
Glucose, Bld: 129 mg/dL — ABNORMAL HIGH (ref 65–99)
Potassium: 4.3 mmol/L (ref 3.5–5.1)
Sodium: 136 mmol/L (ref 135–145)

## 2015-11-24 LAB — GLUCOSE, CAPILLARY
GLUCOSE-CAPILLARY: 125 mg/dL — AB (ref 65–99)
Glucose-Capillary: 149 mg/dL — ABNORMAL HIGH (ref 65–99)
Glucose-Capillary: 129 mg/dL — ABNORMAL HIGH (ref 65–99)
Glucose-Capillary: 167 mg/dL — ABNORMAL HIGH (ref 65–99)

## 2015-11-24 MED ORDER — SODIUM CHLORIDE 0.9 % IJ SOLN
10.0000 mL | Freq: Two times a day (BID) | INTRAMUSCULAR | Status: DC
  Administered 2015-11-25 – 2015-12-05 (×16): 10 mL

## 2015-11-24 MED ORDER — SODIUM CHLORIDE 0.9 % IV SOLN: 100.0000 mL | INTRAVENOUS | Status: DC | PRN

## 2015-11-24 MED ORDER — SODIUM CHLORIDE 0.9 % IJ SOLN
10.0000 mL | INTRAMUSCULAR | Status: DC | PRN
  Administered 2015-11-28: 10 mL
  Filled 2015-11-24: qty 40

## 2015-11-24 NOTE — Care Management Note (Signed)
Case Management Note  Patient Details  Name: [name] MRN: [id number] Date of Birth: Sep 11, 1966  Subjective/Objective:                    Action/Plan:   Expected Discharge Date:  11/18/15               Expected Discharge Plan:  Home/Self Care  In-House Referral:  Financial Counselor  Discharge planning Services  CM Consult  Post Acute Care Choice:  Home Health, Durable Medical Equipment Choice offered to:  Patient  DME Arranged:    DME Agency:     HH Arranged:    [place] Agency:     Status of Service:  In process, will continue to follow  Medicare Important Message Given:    Date Medicare IM Given:    Medicare IM give by:    Date Additional Medicare IM Given:    Additional Medicare Important Message give by:     If discussed at [place] of Stay Meetings, dates discussed:    Additional Comments: Pt now eating better. Dialysis is being determined on a daily basis according to daily labs. Christinia Gully [place], RN 11/24/2015, 3:16 PM

## 2015-11-24 NOTE — Progress Notes (Signed)
Peripherally Inserted Central Catheter/Midline Placement  The IV Nurse has discussed with the patient and/or persons authorized to consent for the patient, the purpose of this procedure and the potential benefits and risks involved with this procedure.  The benefits include less needle sticks, lab draws from the catheter and patient may be discharged home with the catheter.  Risks include, but not limited to, infection, bleeding, blood clot (thrombus formation), and puncture of an artery; nerve damage and irregular heat beat.  Alternatives to this procedure were also discussed.  PICC/Midline Placement Documentation  PICC Single Lumen 11/24/15 PICC Right Cephalic 44 cm 0 cm (Active)  Indication for Insertion or Continuance of Line Limited venous access - need for IV therapy >5 days (PICC only) 11/24/2015  4:33 PM  Exposed Catheter (cm) 0 cm 11/24/2015  4:33 PM  Site Assessment Clean;Dry;Intact 11/24/2015  4:33 PM  Line Status Flushed;Saline locked;Blood return noted 11/24/2015  4:33 PM  Dressing Type Transparent;Securing device 11/24/2015  4:33 PM  Dressing Status Clean;Dry;Intact;Antimicrobial disc in place 11/24/2015  4:33 PM  Line Care Connections checked and tightened 11/24/2015  4:33 PM  Dressing Intervention New dressing 11/24/2015  4:33 PM  Dressing Change Due 12/01/15 11/24/2015  4:33 PM       [name] 11/24/2015, 4:34 PM

## 2015-11-24 NOTE — Progress Notes (Signed)
Subjective: Patient denies any nausea or vomiting. Patient seems to be tolerating feeding. She claims she is feeling much much better. No difficulty in breathing and patient is not on a BiPAP.  Objective: Vital signs in last 24 hours: Temp:  [97.5 F (36.4 C)-98 F (36.7 C)] 97.5 F (36.4 C) (12/05 0500) Pulse Rate:  [65-66] 65 (12/05 0500) Resp:  [16] 16 (12/05 0500) BP: (148-155)/(75-79) 148/75 mmHg (12/05 0500) SpO2:  [94 %-98 %] 94 % (12/05 0756) Weight:  [426 lb (193.232 kg)] 426 lb (193.232 kg) (12/05 0500)  Intake/Output from previous day: 12/04 0701 - 12/05 0700 In: -  Out: 900 [Urine:900] Intake/Output this shift: Total I/O In: 120 [P.O.:120] Out: -    Recent Labs  11/21/15 1134 11/23/15 0608  HGB 10.5* 10.3*    Recent Labs  11/21/15 1134 11/23/15 0608  WBC 11.4* 8.1  RBC 3.69* 3.65*  HCT 32.0* 31.9*  PLT 216 225    Recent Labs  11/23/15 0608 11/24/15 0731  NA 135 136  K 4.7 4.3  CL 97* 100*  CO2 26 27  BUN 54* 59*  CREATININE 3.82* 3.91*  GLUCOSE 110* 129*  CALCIUM 8.6* 8.3*   No results for input(s): LABPT, INR in the last 72 hours.  Patient presently alert and in no apparent distress. Chest decreased breath sound. Heart exam regular rate and rhythm Abdomen : obese and nontender. Extremities patient with chronic lymphedema   Assessment/Plan:  Problem #1 acute kidney injury superimposed on chronic. Patient status post hemodialysis on Friday. Her BUN and creatinine at this moment is stable. Patient does not have any uremic signs and symptoms. She had about 900 mL of urine output Problem #2 hyperkalemia: Her potassium remains normal Problem #3 diabetes: Her blood sugar is reasonably controlled. Problem #4 morbid obesity: Presently she is bed bound.  Problem #5 chronic renal failure: Stage III Problem #6 anemia: Her hemoglobin is low but stable. Problem #7 cellulitis of her left foot. Patient is on antibiotics: She is a febrile and her  white blood cell count is normal. Problem #8 nausea and vomiting. This could be secondary to diabetic gastropathy. Seems to have improved. Plan:1] patient does require dialysis today 2] we'll follow her blood work and make a decision on daily basis. 3] will continue his diuretics. 4]We'll check her basic metabolic panel , phosphorus and CBC in the morning     Valley Ke S 11/24/2015, 10:33 AM

## 2015-11-24 NOTE — Progress Notes (Signed)
Subjective: She says she feels a little better. She did not use BiPAP last night because the respiratory department was exceptionally busy. She is a little more confused this morning. PICC line was unable to be placed yesterday but this will be attempted again today. Her renal function was slightly better yesterday but I'm not sure how much of that is related to having receiving dialysis versus actual improvement in her kidney function.  Objective: Vital signs in last 24 hours: Temp:  [97.5 F (36.4 C)-98 F (36.7 C)] 97.5 F (36.4 C) (12/05 0500) Pulse Rate:  [65-66] 65 (12/05 0500) Resp:  [16] 16 (12/05 0500) BP: (148-155)/(75-79) 148/75 mmHg (12/05 0500) SpO2:  [94 %-98 %] 94 % (12/05 0756) Weight:  [193.232 kg (426 lb)] 193.232 kg (426 lb) (12/05 0500) Weight change:  Last BM Date: 11/23/15  Intake/Output from previous day: 12/04 0701 - 12/05 0700 In: -  Out: 900 [Urine:900]  PHYSICAL EXAM General appearance: alert, cooperative, mild distress, morbidly obese and Mildly confused Resp: clear to auscultation bilaterally Cardio: regular rate and rhythm, S1, S2 normal, no murmur, click, rub or gallop GI: soft, non-tender; bowel sounds normal; no masses,  no organomegaly Extremities: She has lymphedema but the cellulitis seems to have resolved.  Lab Results:  Results for orders placed or performed during the hospital encounter of 11/13/15 (from the past 48 hour(s))  Glucose, capillary     Status: Abnormal   Collection Time: 11/22/15 12:04 PM  Result Value Ref Range   Glucose-Capillary 123 (H) 65 - 99 mg/dL  Glucose, capillary     Status: Abnormal   Collection Time: 11/22/15  5:21 PM  Result Value Ref Range   Glucose-Capillary 128 (H) 65 - 99 mg/dL   Comment 1 Notify RN    Comment 2 Document in Chart   Glucose, capillary     Status: Abnormal   Collection Time: 11/22/15 10:11 PM  Result Value Ref Range   Glucose-Capillary 114 (H) 65 - 99 mg/dL   Comment 1 Notify RN    Phosphorus     Status: Abnormal   Collection Time: 11/23/15  6:08 AM  Result Value Ref Range   Phosphorus 5.7 (H) 2.5 - 4.6 mg/dL  Basic metabolic panel     Status: Abnormal   Collection Time: 11/23/15  6:08 AM  Result Value Ref Range   Sodium 135 135 - 145 mmol/L   Potassium 4.7 3.5 - 5.1 mmol/L   Chloride 97 (L) 101 - 111 mmol/L   CO2 26 22 - 32 mmol/L   Glucose, Bld 110 (H) 65 - 99 mg/dL   BUN 54 (H) 6 - 20 mg/dL   Creatinine, Ser 3.82 (H) 0.44 - 1.00 mg/dL   Calcium 8.6 (L) 8.9 - 10.3 mg/dL   GFR calc non Af Amer 13 (L) >60 mL/min   GFR calc Af Amer 15 (L) >60 mL/min    Comment: (NOTE) The eGFR has been calculated using the CKD EPI equation. This calculation has not been validated in all clinical situations. eGFR's persistently <60 mL/min signify possible Chronic Kidney Disease.    Anion gap 12 5 - 15  CBC     Status: Abnormal   Collection Time: 11/23/15  6:08 AM  Result Value Ref Range   WBC 8.1 4.0 - 10.5 K/uL   RBC 3.65 (L) 3.87 - 5.11 MIL/uL   Hemoglobin 10.3 (L) 12.0 - 15.0 g/dL   HCT 31.9 (L) 36.0 - 46.0 %   MCV 87.4 78.0 - 100.0  fL   MCH 28.2 26.0 - 34.0 pg   MCHC 32.3 30.0 - 36.0 g/dL   RDW 18.0 (H) 11.5 - 15.5 %   Platelets 225 150 - 400 K/uL  Glucose, capillary     Status: Abnormal   Collection Time: 11/23/15  8:14 AM  Result Value Ref Range   Glucose-Capillary 105 (H) 65 - 99 mg/dL  Glucose, capillary     Status: Abnormal   Collection Time: 11/23/15 11:52 AM  Result Value Ref Range   Glucose-Capillary 117 (H) 65 - 99 mg/dL   Comment 1 Notify RN   Glucose, capillary     Status: Abnormal   Collection Time: 11/23/15  4:51 PM  Result Value Ref Range   Glucose-Capillary 122 (H) 65 - 99 mg/dL  Glucose, capillary     Status: Abnormal   Collection Time: 11/23/15  9:36 PM  Result Value Ref Range   Glucose-Capillary 123 (H) 65 - 99 mg/dL   Comment 1 Notify RN    Comment 2 Document in Chart   Glucose, capillary     Status: Abnormal   Collection Time:  11/24/15  7:28 AM  Result Value Ref Range   Glucose-Capillary 125 (H) 65 - 99 mg/dL    ABGS No results for input(s): PHART, PO2ART, TCO2, HCO3 in the last 72 hours.  Invalid input(s): PCO2 CULTURES Recent Results (from the past 240 hour(s))  MRSA PCR Screening     Status: None   Collection Time: 11/18/15 11:45 AM  Result Value Ref Range Status   MRSA by PCR NEGATIVE NEGATIVE Final    Comment:        The GeneXpert MRSA Assay (FDA approved for NASAL specimens only), is one component of a comprehensive MRSA colonization surveillance program. It is not intended to diagnose MRSA infection nor to guide or monitor treatment for MRSA infections.    Studies/Results: No results found.  Medications:  Prior to Admission:  Prescriptions prior to admission  Medication Sig Dispense Refill Last Dose  . ALPRAZolam (XANAX) 0.5 MG tablet Take 0.5 mg by mouth 3 (three) times daily as needed for anxiety or sleep.    11/12/2015 at Unknown time  . amLODipine (NORVASC) 10 MG tablet Take 10 mg by mouth daily.   11/12/2015 at Unknown time  . insulin NPH-insulin regular (NOVOLIN 70/30) (70-30) 100 UNIT/ML injection Inject 60 Units into the skin 2 (two) times daily.   11/12/2015 at Unknown time  . metoprolol (LOPRESSOR) 50 MG tablet Take 50 mg by mouth 2 (two) times daily.   11/12/2015 at Unknown time  . Oxycodone HCl 10 MG TABS Take 10 mg by mouth 4 (four) times daily as needed (pain).    Past Week at Unknown time  . potassium chloride SA (K-DUR,KLOR-CON) 20 MEQ tablet Take 20 mEq by mouth 3 (three) times daily.   11/12/2015 at Unknown time  . torsemide (DEMADEX) 100 MG tablet Take 100 mg by mouth 2 (two) times daily.    11/12/2015 at Unknown time  . acetaminophen (TYLENOL) 325 MG tablet Take 650 mg by mouth every 6 (six) hours as needed. fever   unknown  . diphenhydrAMINE (BENADRYL) 25 mg capsule Take 25 mg by mouth every 6 (six) hours as needed for allergies.   unknown  . docusate sodium (COLACE)  100 MG capsule Take 100 mg by mouth 2 (two) times daily as needed. For constipation   unknown  . HYDROcodone-acetaminophen (NORCO/VICODIN) 5-325 MG per tablet Take 2 tablets by mouth every 4 (  four) hours as needed. (Patient not taking: Reported on 12/02/2014) 10 tablet 0   . oseltamivir (TAMIFLU) 75 MG capsule Take 1 capsule (75 mg total) by mouth every 12 (twelve) hours. (Patient not taking: Reported on 12/02/2014) 10 capsule 0   . oxyCODONE-acetaminophen (PERCOCET/ROXICET) 5-325 MG per tablet Take 1 tablet by mouth every 6 (six) hours as needed for severe pain. 15 tablet 0 unknown   Scheduled: . enoxaparin (LOVENOX) injection  30 mg Subcutaneous Q24H  . furosemide  200 mg Intravenous BID  . insulin aspart  0-20 Units Subcutaneous TID WC  . insulin aspart  0-5 Units Subcutaneous QHS  . metoCLOPramide (REGLAN) injection  5 mg Intravenous 3 times per day  . metolazone  5 mg Oral BID  . metoprolol  50 mg Oral BID  . nystatin   Topical BID  . ondansetron (ZOFRAN) IV  4 mg Intravenous 4 times per day  . sodium chloride  3 mL Intravenous Q12H  . sodium chloride  3 mL Intravenous Q12H   Continuous: . sodium chloride 75 mL/hr at 11/22/15 0502   CQP:EAKLTY chloride, sodium chloride, sodium chloride, sodium chloride, sodium chloride, sodium chloride, sodium chloride, acetaminophen **OR** acetaminophen, acetaminophen, ALPRAZolam, alteplase, alteplase, diphenhydrAMINE, heparin, lidocaine (PF), lidocaine-prilocaine, ondansetron (ZOFRAN) IV, oxyCODONE, pentafluoroprop-tetrafluoroeth, sodium chloride, sodium chloride, sodium chloride  Assesment: She was admitted with cellulitis of the leg. Her cellulitis has resolved She has multiple other medical problems. She has developed acute on chronic renal failure and has a somewhat better urine output. She has required dialysis.  She has diabetes and I think she has some diabetic gastroparesis. She had significant nausea on admission some of which may have been  uremia but also may have been related to gastroparesis. She is better with Reglan.  She has chronic lymphedema of the lower extremities which is unchanged  She has morbid obesity with obesity hypoventilation and is on BiPAP at night  She has chronic diastolic heart failure which seems to be well controlled at this point Principal Problem:   Cellulitis of leg without foot, right Active Problems:   Lymphedema of lower extremity   Diabetes mellitus (HCC)   Morbid obesity (HCC)   Hyperkalemia   Obesity hypoventilation syndrome (HCC)   Chronic diastolic CHF (congestive heart failure) (HCC)   Chronic respiratory failure with hypoxia (HCC)   CKD (chronic kidney disease), stage III   Anemia of chronic disease   Acute on chronic respiratory failure ([place])    Plan: Repeat PICC line attempt today. Await laboratory work. Continue with BiPAP. If she is going to need ongoing dialysis she will need permacath placement. Will discuss with nephrology    LOS: 11 days   Rylynn Kobs L 11/24/2015, 8:42 AM

## 2015-11-25 LAB — BASIC METABOLIC PANEL
Anion gap: 7 (ref 5–15)
BUN: 66 mg/dL — AB (ref 6–20)
CO2: 29 mmol/L (ref 22–32)
CREATININE: 3.88 mg/dL — AB (ref 0.44–1.00)
Calcium: 8.5 mg/dL — ABNORMAL LOW (ref 8.9–10.3)
Chloride: 99 mmol/L — ABNORMAL LOW (ref 101–111)
GFR calc Af Amer: 15 mL/min — ABNORMAL LOW (ref >60)
GFR, EST NON AFRICAN AMERICAN: 13 mL/min — AB (ref >60)
GLUCOSE: 135 mg/dL — AB (ref 65–99)
POTASSIUM: 4.4 mmol/L (ref 3.5–5.1)
SODIUM: 135 mmol/L (ref 135–145)

## 2015-11-25 LAB — GLUCOSE, CAPILLARY
Glucose-Capillary: 122 mg/dL — ABNORMAL HIGH (ref 65–99)
Glucose-Capillary: 111 mg/dL — ABNORMAL HIGH (ref 65–99)
Glucose-Capillary: 147 mg/dL — ABNORMAL HIGH (ref 65–99)
Glucose-Capillary: 157 mg/dL — ABNORMAL HIGH (ref 65–99)

## 2015-11-25 MED ORDER — PROMETHAZINE HCL 25 MG/ML IJ SOLN
12.5000 mg | Freq: Four times a day (QID) | INTRAMUSCULAR | Status: DC | PRN
  Administered 2015-11-25 – 2015-11-30 (×2): 12.5 mg via INTRAVENOUS
  Filled 2015-11-25 (×2): qty 1

## 2015-11-25 MED ORDER — PRO-STAT SUGAR FREE PO LIQD
30.0000 mL | Freq: Two times a day (BID) | ORAL | Status: DC
  Administered 2015-11-25 – 2015-12-05 (×10): 30 mL via ORAL
  Filled 2015-11-25 (×20): qty 30

## 2015-11-25 NOTE — Progress Notes (Signed)
Nutrition Follow-up  DOCUMENTATION CODES:  Morbid obesity  INTERVENTION:  Magic cup BID s, each supplement provides 290 kcal and 9 grams of protein  30 ml Prostat BID, each 30 mls provides 100 kcal and 15 grams of Protein.   Pt still struggling with oral intake. Has not been able to meet >50% of estimated needs for all, but 1 day of her admission.  If patient cannot meet >50% of estimated needs in immediate future, Enteral nutrition is warranted  NUTRITION DIAGNOSIS:  Inadequate oral intake related to poor appetite as evidenced by eating <50% of needs for greater than 10 days  Ongoing, worsening  GOAL:  Patient will meet greater than or equal to 90% of their needs  MONITOR:  PO intake, Supplement acceptance, Labs, Weight trends   ASSESSMENT:  49 y/o female PMH DM2, CAS, HTN, Hypercholesterolemia,  BLE lymphedema, grade 2 diastolic dysfunction, morbid obesity presented to ED with h/o progressive BLE edema, n,v, poor PO intake, SOB, decreased UOP, pain and weeping. Initial evaluation was concerning for early cellulitis. During admission devleoped  Acute respiratory failure (subsequent transfer to ICU) and AKI on CKD3 s/p dialysis 11/28 and 11/29  Interval Hx 11/30-12/1: Patient still w/ nausea still. Still no intake. Weighed yesterday as 363 lbs?  Interval Hx: 12/1-12/6: Had dialysis again on 12/2. Oral intake has finally begun, but is still very limited due to ongoing N/V/fatigue/confusion. The only day she ate somewhat well was 12/5. AKI appears to be biggest issue at the moment.   On RD arrival, pt once again is asleep. Spoke with family member. He reports that patients appetite has improved however day she is having significant nausea and vomiting again. Asked if there are any foods patients seems to tolerate better/worse. He did not think so. Saw pt had drank her juice this morning. Will try Prostat BID mixed in jucie and magic cup now that she is off [place] diet.   Pt ate ~50% of  meals yesterday however sounds to be struggling again today. If she continues to not be able to meet >50% of her estimated needs then Enteral nutrition will still be warranted  Diet Order:  Diet Heart Room service appropriate?: Yes; Fluid consistency:: Thin  Skin:  Cellulitis BLE  Last BM:  11/29  Height:  Ht Readings from Last 1 Encounters:  11/13/15 _0  (1.651 m)   Weight:  Wt Readings from Last 1 Encounters:  11/24/15 426 lb (193.232 kg)    Wt Readings from Last 10 Encounters:  11/24/15 426 lb (193.232 kg)  09/11/15 350 lb (158.759 kg)  12/02/14 350 lb (158.759 kg)  11/06/14 340 lb (154.223 kg)  08/11/13 330 lb (149.687 kg)  05/30/13 342 lb (155.13 kg)  04/04/13 370 lb (167.831 kg)  09/05/12 388 lb 0.2 oz (176 kg)  07/08/12 375 lb 10.6 oz (170.4 kg)  03/08/12 358 lb 14.5 oz (162.8 kg)   Ideal Body Weight:  56.82 kg  BMI:  Body mass index is 70.89 kg/(m^2).  SBW for age/ht: 81 kg  Estimated Nutritional Needs:  Kcal:  1250-1400 kcals Protein:  49-65 g (.6-.8 g/kg SBW) Fluid:  Per MD  EDUCATION NEEDS:  No education needs identified at this time  Burtis Junes RD, LDN Nutrition Pager: [phone] 11/25/2015 11:58 AM

## 2015-11-25 NOTE — Care Management Note (Signed)
Case Management Note  Patient Details  Name: [name] MRN: [id number] Date of Birth: 1966-04-01  Subjective/Objective:                    Action/Plan:   Expected Discharge Date:  11/18/15               Expected Discharge Plan:  Home/Self Care  In-House Referral:  Financial Counselor  Discharge planning Services  CM Consult  Post Acute Care Choice:  Home Health, Durable Medical Equipment Choice offered to:  Patient  DME Arranged:    DME Agency:     HH Arranged:    [place] Agency:     Status of Service:  In process, will continue to follow  Medicare Important Message Given:    Date Medicare IM Given:    Medicare IM give by:    Date Additional Medicare IM Given:    Additional Medicare Important Message give by:     If discussed at [place] of Stay Meetings, dates discussed:11/25/15    Additional Comments:  Joylene Draft, RN 11/25/2015, 3:49 PM

## 2015-11-25 NOTE — Progress Notes (Signed)
Subjective: Patient continued to feel better. She has episode of nausea yesterday has Y she is feeling good. She is able to tolerate feeding. She denies any difficulty breathing.  Objective: Vital signs in last 24 hours: Temp:  [97.8 F (36.6 C)-98.5 F (36.9 C)] 98.2 F (36.8 C) (12/06 0542) Pulse Rate:  [64-69] 64 (12/06 0542) Resp:  [15-18] 15 (12/06 0542) BP: (112-152)/(49-80) 112/49 mmHg (12/06 0542) SpO2:  [94 %-100 %] 100 % (12/06 0542)  Intake/Output from previous day: 12/05 0701 - 12/06 0700 In: 600 [P.O.:600] Out: 1200 [Urine:1200] Intake/Output this shift:     Recent Labs  11/23/15 0608  HGB 10.3*    Recent Labs  11/23/15 0608  WBC 8.1  RBC 3.65*  HCT 31.9*  PLT 225    Recent Labs  11/23/15 0608 11/24/15 0731  NA 135 136  K 4.7 4.3  CL 97* 100*  CO2 26 27  BUN 54* 59*  CREATININE 3.82* 3.91*  GLUCOSE 110* 129*  CALCIUM 8.6* 8.3*   No results for input(s): LABPT, INR in the last 72 hours.  Patient presently alert and in no apparent distress. Chest decreased breath sound. Heart exam regular rate and rhythm Abdomen : obese and nontender. Extremities patient with chronic lymphedema   Assessment/Plan:  Problem #1 acute kidney injury superimposed on chronic. Patient has remained asymptomatic. Marland Kitchen Patient's urine output has improved and she has about 1200 mL of urine output. Her last dialysis was on Friday. Her renal function seems stablizing  Problem #2 hyperkalemia: Her potassium remains normal Problem #3 diabetes: Her blood sugar is reasonably controlled. Problem #4 morbid obesity: Presently she is bed bound.  Problem #5 chronic renal failure: Stage III Problem #6 anemia: Her hemoglobin is low but stable. Problem #7 cellulitis of her left foot. Patient is on antibiotics: She is a febrile and her white blood cell count is normal. Problem #8 nausea and vomiting. Most likely secondary to diabetic gastropathy and improving. Problem#9 Metabolic bone  disease: Her calcium is normal and phosphorus is slightly high but stable Plan:1] patient does require dialysis today 2] will continue his diuretics. 3]We'll check her basic metabolic panel if her renal function showed some improvement or remain stable we will ask surgery to remove her femoral cathteter     [facility] S 11/25/2015, 7:28 AM

## 2015-11-25 NOTE — Progress Notes (Signed)
Patient attempted BIPAP but said it was too much, RN took off and placed back on [facility]

## 2015-11-25 NOTE — Progress Notes (Signed)
Talked with pt about wearing BIPAP machine tonight but she refused and said it was to strong the night before and wasn't willing to try again. Stated that it made her head hurt. Nurse informed. SPO2 95% on 4lpm cann

## 2015-11-25 NOTE — Progress Notes (Signed)
Subjective: She feels better. She still been a little confused. She is not wearing BiPAP. Her renal function seems pretty stable  Objective: Vital signs in last 24 hours: Temp:  [97.8 F (36.6 C)-98.5 F (36.9 C)] 98.2 F (36.8 C) (12/06 0542) Pulse Rate:  [64-69] 64 (12/06 0542) Resp:  [15-18] 15 (12/06 0542) BP: (112-152)/(49-80) 112/49 mmHg (12/06 0542) SpO2:  [94 %-100 %] 99 % (12/06 0907) Weight change:  Last BM Date: 11/23/15  Intake/Output from previous day: 12/05 0701 - 12/06 0700 In: 600 [P.O.:600] Out: 1200 [Urine:1200]  PHYSICAL EXAM General appearance: alert, cooperative, no distress and morbidly obese Resp: clear to auscultation bilaterally Cardio: regular rate and rhythm, S1, S2 normal, no murmur, click, rub or gallop GI: soft, non-tender; bowel sounds normal; no masses,  no organomegaly Extremities: Chronic lymphedema but cellulitis has resolved  Lab Results:  Results for orders placed or performed during the hospital encounter of 11/13/15 (from the past 48 hour(s))  Glucose, capillary     Status: Abnormal   Collection Time: 11/23/15 11:52 AM  Result Value Ref Range   Glucose-Capillary 117 (H) 65 - 99 mg/dL   Comment 1 Notify RN   Glucose, capillary     Status: Abnormal   Collection Time: 11/23/15  4:51 PM  Result Value Ref Range   Glucose-Capillary 122 (H) 65 - 99 mg/dL  Glucose, capillary     Status: Abnormal   Collection Time: 11/23/15  9:36 PM  Result Value Ref Range   Glucose-Capillary 123 (H) 65 - 99 mg/dL   Comment 1 Notify RN    Comment 2 Document in Chart   Glucose, capillary     Status: Abnormal   Collection Time: 11/24/15  7:28 AM  Result Value Ref Range   Glucose-Capillary 125 (H) 65 - 99 mg/dL  Basic metabolic panel     Status: Abnormal   Collection Time: 11/24/15  7:31 AM  Result Value Ref Range   Sodium 136 135 - 145 mmol/L   Potassium 4.3 3.5 - 5.1 mmol/L   Chloride 100 (L) 101 - 111 mmol/L   CO2 27 22 - 32 mmol/L   Glucose, Bld  129 (H) 65 - 99 mg/dL   BUN 59 (H) 6 - 20 mg/dL   Creatinine, Ser 3.91 (H) 0.44 - 1.00 mg/dL   Calcium 8.3 (L) 8.9 - 10.3 mg/dL   GFR calc non Af Amer 13 (L) >60 mL/min   GFR calc Af Amer 14 (L) >60 mL/min    Comment: (NOTE) The eGFR has been calculated using the CKD EPI equation. This calculation has not been validated in all clinical situations. eGFR's persistently <60 mL/min signify possible Chronic Kidney Disease.    Anion gap 9 5 - 15  Glucose, capillary     Status: Abnormal   Collection Time: 11/24/15 11:27 AM  Result Value Ref Range   Glucose-Capillary 149 (H) 65 - 99 mg/dL  Glucose, capillary     Status: Abnormal   Collection Time: 11/24/15  4:51 PM  Result Value Ref Range   Glucose-Capillary 129 (H) 65 - 99 mg/dL  Glucose, capillary     Status: Abnormal   Collection Time: 11/24/15  9:14 PM  Result Value Ref Range   Glucose-Capillary 167 (H) 65 - 99 mg/dL   Comment 1 Notify RN    Comment 2 Document in Chart   Glucose, capillary     Status: Abnormal   Collection Time: 11/25/15  7:47 AM  Result Value Ref Range  Glucose-Capillary 122 (H) 65 - 99 mg/dL  Basic metabolic panel     Status: Abnormal   Collection Time: 11/25/15  7:59 AM  Result Value Ref Range   Sodium 135 135 - 145 mmol/L   Potassium 4.4 3.5 - 5.1 mmol/L   Chloride 99 (L) 101 - 111 mmol/L   CO2 29 22 - 32 mmol/L   Glucose, Bld 135 (H) 65 - 99 mg/dL   BUN 66 (H) 6 - 20 mg/dL   Creatinine, Ser 3.88 (H) 0.44 - 1.00 mg/dL   Calcium 8.5 (L) 8.9 - 10.3 mg/dL   GFR calc non Af Amer 13 (L) >60 mL/min   GFR calc Af Amer 15 (L) >60 mL/min    Comment: (NOTE) The eGFR has been calculated using the CKD EPI equation. This calculation has not been validated in all clinical situations. eGFR's persistently <60 mL/min signify possible Chronic Kidney Disease.    Anion gap 7 5 - 15    ABGS No results for input(s): PHART, PO2ART, TCO2, HCO3 in the last 72 hours.  Invalid input(s): PCO2 CULTURES Recent Results  (from the past 240 hour(s))  MRSA PCR Screening     Status: None   Collection Time: 11/18/15 11:45 AM  Result Value Ref Range Status   MRSA by PCR NEGATIVE NEGATIVE Final    Comment:        The GeneXpert MRSA Assay (FDA approved for NASAL specimens only), is one component of a comprehensive MRSA colonization surveillance program. It is not intended to diagnose MRSA infection nor to guide or monitor treatment for MRSA infections.    Studies/Results: No results found.  Medications:  Prior to Admission:  Prescriptions prior to admission  Medication Sig Dispense Refill Last Dose  . ALPRAZolam (XANAX) 0.5 MG tablet Take 0.5 mg by mouth 3 (three) times daily as needed for anxiety or sleep.    11/12/2015 at Unknown time  . amLODipine (NORVASC) 10 MG tablet Take 10 mg by mouth daily.   11/12/2015 at Unknown time  . insulin NPH-insulin regular (NOVOLIN 70/30) (70-30) 100 UNIT/ML injection Inject 60 Units into the skin 2 (two) times daily.   11/12/2015 at Unknown time  . metoprolol (LOPRESSOR) 50 MG tablet Take 50 mg by mouth 2 (two) times daily.   11/12/2015 at Unknown time  . Oxycodone HCl 10 MG TABS Take 10 mg by mouth 4 (four) times daily as needed (pain).    Past Week at Unknown time  . potassium chloride SA (K-DUR,KLOR-CON) 20 MEQ tablet Take 20 mEq by mouth 3 (three) times daily.   11/12/2015 at Unknown time  . torsemide (DEMADEX) 100 MG tablet Take 100 mg by mouth 2 (two) times daily.    11/12/2015 at Unknown time  . acetaminophen (TYLENOL) 325 MG tablet Take 650 mg by mouth every 6 (six) hours as needed. fever   unknown  . diphenhydrAMINE (BENADRYL) 25 mg capsule Take 25 mg by mouth every 6 (six) hours as needed for allergies.   unknown  . docusate sodium (COLACE) 100 MG capsule Take 100 mg by mouth 2 (two) times daily as needed. For constipation   unknown  . HYDROcodone-acetaminophen (NORCO/VICODIN) 5-325 MG per tablet Take 2 tablets by mouth every 4 (four) hours as needed. (Patient  not taking: Reported on 12/02/2014) 10 tablet 0   . oseltamivir (TAMIFLU) 75 MG capsule Take 1 capsule (75 mg total) by mouth every 12 (twelve) hours. (Patient not taking: Reported on 12/02/2014) 10 capsule 0   .  oxyCODONE-acetaminophen (PERCOCET/ROXICET) 5-325 MG per tablet Take 1 tablet by mouth every 6 (six) hours as needed for severe pain. 15 tablet 0 unknown   Scheduled: . enoxaparin (LOVENOX) injection  30 mg Subcutaneous Q24H  . furosemide  200 mg Intravenous BID  . insulin aspart  0-20 Units Subcutaneous TID WC  . insulin aspart  0-5 Units Subcutaneous QHS  . metoCLOPramide (REGLAN) injection  5 mg Intravenous 3 times per day  . metolazone  5 mg Oral BID  . metoprolol  50 mg Oral BID  . nystatin   Topical BID  . ondansetron (ZOFRAN) IV  4 mg Intravenous 4 times per day  . sodium chloride  10-40 mL Intracatheter Q12H  . sodium chloride  3 mL Intravenous Q12H  . sodium chloride  3 mL Intravenous Q12H   Continuous: . sodium chloride 75 mL/hr at 11/22/15 0502   LSL:HTDSKA chloride, sodium chloride, sodium chloride, sodium chloride, sodium chloride, sodium chloride, sodium chloride, acetaminophen **OR** acetaminophen, acetaminophen, ALPRAZolam, alteplase, alteplase, diphenhydrAMINE, heparin, lidocaine (PF), lidocaine-prilocaine, ondansetron (ZOFRAN) IV, oxyCODONE, pentafluoroprop-tetrafluoroeth, sodium chloride, sodium chloride, sodium chloride, sodium chloride  Assesment: She was admitted with cellulitis of her leg. This has resolved. She has developed acute on chronic renal failure and has required dialysis. She is doing better. Her renal function seems to have stabilized song going to see if we need to keep the central line in her groin. She has a PICC line now. She may need permacath if she is going to need further dialysis. Principal Problem:   Cellulitis of leg without foot, right Active Problems:   Lymphedema of lower extremity   Diabetes mellitus (HCC)   Morbid obesity (HCC)    Hyperkalemia   Obesity hypoventilation syndrome (HCC)   Chronic diastolic CHF (congestive heart failure) (HCC)   Chronic respiratory failure with hypoxia (HCC)   CKD (chronic kidney disease), stage III   Anemia of chronic disease   Acute on chronic respiratory failure ([place])    Plan: Discuss with Dr. Lowanda Foster and see about central line versus permacath continue other treatments. She will need sleep study as an outpatient    LOS: 12 days   Everest Brod L 11/25/2015, 9:23 AM

## 2015-11-26 LAB — BASIC METABOLIC PANEL
Anion gap: 6 (ref 5–15)
BUN: 70 mg/dL — ABNORMAL HIGH (ref 6–20)
CALCIUM: 8.4 mg/dL — AB (ref 8.9–10.3)
CHLORIDE: 98 mmol/L — AB (ref 101–111)
CO2: 30 mmol/L (ref 22–32)
CREATININE: 4.28 mg/dL — AB (ref 0.44–1.00)
GFR calc non Af Amer: 11 mL/min — ABNORMAL LOW (ref >60)
GFR, EST AFRICAN AMERICAN: 13 mL/min — AB (ref >60)
Glucose, Bld: 200 mg/dL — ABNORMAL HIGH (ref 65–99)
Potassium: 4.3 mmol/L (ref 3.5–5.1)
SODIUM: 134 mmol/L — AB (ref 135–145)

## 2015-11-26 LAB — GLUCOSE, CAPILLARY
GLUCOSE-CAPILLARY: 166 mg/dL — AB (ref 65–99)
Glucose-Capillary: 201 mg/dL — ABNORMAL HIGH (ref 65–99)
Glucose-Capillary: 196 mg/dL — ABNORMAL HIGH (ref 65–99)

## 2015-11-26 NOTE — Progress Notes (Signed)
Subjective: She says she feels better. Her nausea has improved. Her renal function is not quite as good  Objective: Vital signs in last 24 hours: Temp:  [97.4 F (36.3 C)-98 F (36.7 C)] 97.4 F (36.3 C) (12/07 0658) Pulse Rate:  [60-66] 60 (12/07 0658) Resp:  [16-20] 20 (12/07 0658) BP: (126-133)/(66-80) 126/66 mmHg (12/07 0658) SpO2:  [93 %-100 %] 98 % (12/07 0827) Weight:  [215.912 kg (476 lb)] 215.912 kg (476 lb) (12/07 0658) Weight change:  Last BM Date: 11/25/15  Intake/Output from previous day: 12/06 0701 - 12/07 0700 In: 860 [P.O.:600; I.V.:120; IV Piggyback:140] Out: 350 [Urine:350]  PHYSICAL EXAM General appearance: alert, cooperative, mild distress and morbidly obese Resp: clear to auscultation bilaterally Cardio: regular rate and rhythm, S1, S2 normal, no murmur, click, rub or gallop GI: soft, non-tender; bowel sounds normal; no masses,  no organomegaly Extremities: She has significant lymphedema but cellulitis has resolved  Lab Results:  Results for orders placed or performed during the hospital encounter of 11/13/15 (from the past 48 hour(s))  Glucose, capillary     Status: Abnormal   Collection Time: 11/24/15 11:27 AM  Result Value Ref Range   Glucose-Capillary 149 (H) 65 - 99 mg/dL  Glucose, capillary     Status: Abnormal   Collection Time: 11/24/15  4:51 PM  Result Value Ref Range   Glucose-Capillary 129 (H) 65 - 99 mg/dL  Glucose, capillary     Status: Abnormal   Collection Time: 11/24/15  9:14 PM  Result Value Ref Range   Glucose-Capillary 167 (H) 65 - 99 mg/dL   Comment 1 Notify RN    Comment 2 Document in Chart   Glucose, capillary     Status: Abnormal   Collection Time: 11/25/15  7:47 AM  Result Value Ref Range   Glucose-Capillary 122 (H) 65 - 99 mg/dL  Basic metabolic panel     Status: Abnormal   Collection Time: 11/25/15  7:59 AM  Result Value Ref Range   Sodium 135 135 - 145 mmol/L   Potassium 4.4 3.5 - 5.1 mmol/L   Chloride 99 (L) 101 -  111 mmol/L   CO2 29 22 - 32 mmol/L   Glucose, Bld 135 (H) 65 - 99 mg/dL   BUN 66 (H) 6 - 20 mg/dL   Creatinine, Ser 3.88 (H) 0.44 - 1.00 mg/dL   Calcium 8.5 (L) 8.9 - 10.3 mg/dL   GFR calc non Af Amer 13 (L) >60 mL/min   GFR calc Af Amer 15 (L) >60 mL/min    Comment: (NOTE) The eGFR has been calculated using the CKD EPI equation. This calculation has not been validated in all clinical situations. eGFR's persistently <60 mL/min signify possible Chronic Kidney Disease.    Anion gap 7 5 - 15  Glucose, capillary     Status: Abnormal   Collection Time: 11/25/15 11:43 AM  Result Value Ref Range   Glucose-Capillary 111 (H) 65 - 99 mg/dL  Glucose, capillary     Status: Abnormal   Collection Time: 11/25/15  4:25 PM  Result Value Ref Range   Glucose-Capillary 147 (H) 65 - 99 mg/dL   Comment 1 Notify RN    Comment 2 Document in Chart   Glucose, capillary     Status: Abnormal   Collection Time: 11/25/15  8:39 PM  Result Value Ref Range   Glucose-Capillary 157 (H) 65 - 99 mg/dL   Comment 1 Notify RN    Comment 2 Document in Chart   Basic  metabolic panel     Status: Abnormal   Collection Time: 11/26/15  5:25 AM  Result Value Ref Range   Sodium 134 (L) 135 - 145 mmol/L   Potassium 4.3 3.5 - 5.1 mmol/L   Chloride 98 (L) 101 - 111 mmol/L   CO2 30 22 - 32 mmol/L   Glucose, Bld 200 (H) 65 - 99 mg/dL   BUN 70 (H) 6 - 20 mg/dL   Creatinine, Ser 4.28 (H) 0.44 - 1.00 mg/dL   Calcium 8.4 (L) 8.9 - 10.3 mg/dL   GFR calc non Af Amer 11 (L) >60 mL/min   GFR calc Af Amer 13 (L) >60 mL/min    Comment: (NOTE) The eGFR has been calculated using the CKD EPI equation. This calculation has not been validated in all clinical situations. eGFR's persistently <60 mL/min signify possible Chronic Kidney Disease.    Anion gap 6 5 - 15  Glucose, capillary     Status: Abnormal   Collection Time: 11/26/15  7:35 AM  Result Value Ref Range   Glucose-Capillary 166 (H) 65 - 99 mg/dL   Comment 1 Notify RN     Comment 2 Document in Chart     ABGS No results for input(s): PHART, PO2ART, TCO2, HCO3 in the last 72 hours.  Invalid input(s): PCO2 CULTURES Recent Results (from the past 240 hour(s))  MRSA PCR Screening     Status: None   Collection Time: 11/18/15 11:45 AM  Result Value Ref Range Status   MRSA by PCR NEGATIVE NEGATIVE Final    Comment:        The GeneXpert MRSA Assay (FDA approved for NASAL specimens only), is one component of a comprehensive MRSA colonization surveillance program. It is not intended to diagnose MRSA infection nor to guide or monitor treatment for MRSA infections.    Studies/Results: No results found.  Medications:  Prior to Admission:  Prescriptions prior to admission  Medication Sig Dispense Refill Last Dose  . ALPRAZolam (XANAX) 0.5 MG tablet Take 0.5 mg by mouth 3 (three) times daily as needed for anxiety or sleep.    11/12/2015 at Unknown time  . amLODipine (NORVASC) 10 MG tablet Take 10 mg by mouth daily.   11/12/2015 at Unknown time  . insulin NPH-insulin regular (NOVOLIN 70/30) (70-30) 100 UNIT/ML injection Inject 60 Units into the skin 2 (two) times daily.   11/12/2015 at Unknown time  . metoprolol (LOPRESSOR) 50 MG tablet Take 50 mg by mouth 2 (two) times daily.   11/12/2015 at Unknown time  . Oxycodone HCl 10 MG TABS Take 10 mg by mouth 4 (four) times daily as needed (pain).    Past Week at Unknown time  . potassium chloride SA (K-DUR,KLOR-CON) 20 MEQ tablet Take 20 mEq by mouth 3 (three) times daily.   11/12/2015 at Unknown time  . torsemide (DEMADEX) 100 MG tablet Take 100 mg by mouth 2 (two) times daily.    11/12/2015 at Unknown time  . acetaminophen (TYLENOL) 325 MG tablet Take 650 mg by mouth every 6 (six) hours as needed. fever   unknown  . diphenhydrAMINE (BENADRYL) 25 mg capsule Take 25 mg by mouth every 6 (six) hours as needed for allergies.   unknown  . docusate sodium (COLACE) 100 MG capsule Take 100 mg by mouth 2 (two) times daily as  needed. For constipation   unknown  . HYDROcodone-acetaminophen (NORCO/VICODIN) 5-325 MG per tablet Take 2 tablets by mouth every 4 (four) hours as needed. (Patient not taking:  Reported on 12/02/2014) 10 tablet 0   . oseltamivir (TAMIFLU) 75 MG capsule Take 1 capsule (75 mg total) by mouth every 12 (twelve) hours. (Patient not taking: Reported on 12/02/2014) 10 capsule 0   . oxyCODONE-acetaminophen (PERCOCET/ROXICET) 5-325 MG per tablet Take 1 tablet by mouth every 6 (six) hours as needed for severe pain. 15 tablet 0 unknown   Scheduled: . enoxaparin (LOVENOX) injection  30 mg Subcutaneous Q24H  . feeding supplement (PRO-STAT SUGAR FREE 64)  30 mL Oral BID  . furosemide  200 mg Intravenous BID  . insulin aspart  0-20 Units Subcutaneous TID WC  . insulin aspart  0-5 Units Subcutaneous QHS  . metoCLOPramide (REGLAN) injection  5 mg Intravenous 3 times per day  . metolazone  5 mg Oral BID  . metoprolol  50 mg Oral BID  . nystatin   Topical BID  . ondansetron (ZOFRAN) IV  4 mg Intravenous 4 times per day  . sodium chloride  10-40 mL Intracatheter Q12H  . sodium chloride  3 mL Intravenous Q12H  . sodium chloride  3 mL Intravenous Q12H   Continuous: . sodium chloride 75 mL/hr at 11/26/15 7741   OIN:OMVEHM chloride, sodium chloride, sodium chloride, sodium chloride, sodium chloride, sodium chloride, sodium chloride, acetaminophen **OR** acetaminophen, acetaminophen, ALPRAZolam, alteplase, alteplase, diphenhydrAMINE, heparin, lidocaine (PF), lidocaine-prilocaine, ondansetron (ZOFRAN) IV, oxyCODONE, pentafluoroprop-tetrafluoroeth, promethazine, sodium chloride, sodium chloride, sodium chloride, sodium chloride  Assesment: She was admitted with cellulitis of her right leg and has been treated with IV antibiotics. She is better. She has chronic lymphedema of the leg.  She developed acute on chronic renal failure and has required dialysis. She may require chronic dialysis.  She is morbidly obese and  essentially bedbound. She will need PT consultation we get the central venous catheter out of her femoral  I think she has obesity hypoventilation but she is not tolerating BiPAP Principal Problem:   Cellulitis of leg without foot, right Active Problems:   Lymphedema of lower extremity   Diabetes mellitus (HCC)   Morbid obesity (HCC)   Hyperkalemia   Obesity hypoventilation syndrome (HCC)   Chronic diastolic CHF (congestive heart failure) (HCC)   Chronic respiratory failure with hypoxia (HCC)   CKD (chronic kidney disease), stage III   Anemia of chronic disease   Acute on chronic respiratory failure ([place])    Plan: Continue current treatments. Discussed with renal consultants. Depending on their feeling she may need more dialysis and may need permacath    LOS: 13 days   Antasia Haider L 11/26/2015, 9:05 AM

## 2015-11-26 NOTE — Progress Notes (Signed)
[name]  MRN: [id number]  DOB/AGE: [birth year] 49 y.o.  Primary Care Physician:HAWKINS,EDWARD L, MD  Admit date: 11/13/2015  Chief Complaint:  Chief Complaint  Patient presents with  . Leg Pain    S-Pt presented on  11/13/2015 with  Chief Complaint  Patient presents with  . Leg Pain  .    Pt says she is better than before but still has swelling.  meds . enoxaparin (LOVENOX) injection  30 mg Subcutaneous Q24H  . feeding supplement (PRO-STAT SUGAR FREE 64)  30 mL Oral BID  . furosemide  200 mg Intravenous BID  . insulin aspart  0-20 Units Subcutaneous TID WC  . insulin aspart  0-5 Units Subcutaneous QHS  . metoCLOPramide (REGLAN) injection  5 mg Intravenous 3 times per day  . metolazone  5 mg Oral BID  . metoprolol  50 mg Oral BID  . nystatin   Topical BID  . ondansetron (ZOFRAN) IV  4 mg Intravenous 4 times per day  . sodium chloride  10-40 mL Intracatheter Q12H  . sodium chloride  3 mL Intravenous Q12H  . sodium chloride  3 mL Intravenous Q12H     Physical Exam: Vital signs in last 24 hours: Temp:  [97.4 F (36.3 C)-98.1 F (36.7 C)] 98.1 F (36.7 C) (12/07 1426) Pulse Rate:  [60-66] 60 (12/07 1426) Resp:  [16-20] 18 (12/07 1426) BP: (121-126)/(66-72) 121/70 mmHg (12/07 1426) SpO2:  [93 %-98 %] 96 % (12/07 1426) Weight:  [476 lb (215.912 kg)] 476 lb (215.912 kg) (12/07 0658) Weight change:  Last BM Date: 11/25/15  Intake/Output from previous day: 12/06 0701 - 12/07 0700 In: 860 [P.O.:600; I.V.:120; IV Piggyback:140] Out: 350 [Urine:350] Total I/O In: 320 [P.O.:320] Out: 300 [Urine:300]   Physical Exam: General- pt is awake,alert, following commands. Resp- No acute REsp distress, decreased Bs at bases CVS- S1S2 regular in rate and rhythm GIT- BS+, soft, NT, Morbidly obese EXT- Chronic  LE lympheedema, NO Cyanosis, chronic venous stasis changes   Lab Results: HGb 10.3 on 11/23/15   BMET  Recent Labs  11/25/15 0759 11/26/15 0525  NA  135 134*  K 4.4 4.3  CL 99* 98*  CO2 29 30  GLUCOSE 135* 200*  BUN 66* 70*  CREATININE 3.88* 4.28*  CALCIUM 8.5* 8.4*    Creat Trend            12/2   HD      12/4 no HD after this 2016  4.25    =>       3.82=>3.91=>3.88=>4.28   MICRO Recent Results (from the past 240 hour(s))  MRSA PCR Screening     Status: None   Collection Time: 11/18/15 11:45 AM  Result Value Ref Range Status   MRSA by PCR NEGATIVE NEGATIVE Final    Comment:        The GeneXpert MRSA Assay (FDA approved for NASAL specimens only), is one component of a comprehensive MRSA colonization surveillance program. It is not intended to diagnose MRSA infection nor to guide or monitor treatment for MRSA infections.       Lab Results  Component Value Date   PTH 60.6 09/06/2012   CALCIUM 8.4* 11/26/2015   PHOS 5.7* 11/23/2015               Impression: 1)Renal  AKI secondary to ATN               AKI on CKD  CKD stage 3  .               CKD since 2012               CKD secondary to DM/HTN/ Obesity related glomerulopathy                Progression of CKD marked with AKI                Pt initiated on renal replacement therapy on 11/17/15                Pt last HD was on 11/21/15                Pt did not have HD after 11/21/15 as Crea was improving                Pt urine output now low again and Creat higher                Pt will most likely need HD again in am , if creat trends higher             2)HTN  Medication- On Diuretics On Beta blockers   3)Anemia HGb stable  4)CKD Mineral-Bone Disorder Secondary Hyperparathyroidism absent. Phosphorus not at goal. Calcium when corrected for low albumin is  at goal.  5)ID-admitted with cellulitis PMD following  6)Electrolytes Hyperkalemic    Started on  Lasix    GIven Kayexalate     Later was on renal replacment therapy      Now better  Hyponatremic   stable   7)Acid base Co2 at goal     Plan:  Will  continue current care Will decide in am regarding need of HD again. Pt may need tunneled cath     Racine S 11/26/2015, 4:42 PM

## 2015-11-27 LAB — CBC
HCT: 32.7 % — ABNORMAL LOW (ref 36.0–46.0)
Hemoglobin: 10.4 g/dL — ABNORMAL LOW (ref 12.0–15.0)
MCH: 28.2 pg (ref 26.0–34.0)
MCHC: 31.8 g/dL (ref 30.0–36.0)
MCV: 88.6 fL (ref 78.0–100.0)
PLATELETS: 224 10*3/uL (ref 150–400)
RBC: 3.69 MIL/uL — ABNORMAL LOW (ref 3.87–5.11)
RDW: 18.5 % — AB (ref 11.5–15.5)
WBC: 9.3 10*3/uL (ref 4.0–10.5)

## 2015-11-27 LAB — GLUCOSE, CAPILLARY
GLUCOSE-CAPILLARY: 180 mg/dL — AB (ref 65–99)
GLUCOSE-CAPILLARY: 160 mg/dL — AB (ref 65–99)
Glucose-Capillary: 158 mg/dL — ABNORMAL HIGH (ref 65–99)
Glucose-Capillary: 136 mg/dL — ABNORMAL HIGH (ref 65–99)

## 2015-11-27 LAB — CREATININE, SERUM
Creatinine, Ser: 4.58 mg/dL — ABNORMAL HIGH (ref 0.44–1.00)
GFR calc Af Amer: 12 mL/min — ABNORMAL LOW (ref >60)
GFR calc non Af Amer: 10 mL/min — ABNORMAL LOW (ref >60)

## 2015-11-27 LAB — APTT: aPTT: 36 seconds (ref 24–37)

## 2015-11-27 LAB — PROTIME-INR
INR: 1.28 (ref 0.00–1.49)
PROTHROMBIN TIME: 16.2 s — AB (ref 11.6–15.2)

## 2015-11-27 MED ORDER — SODIUM CHLORIDE 0.9 % IJ SOLN
INTRAMUSCULAR | Status: AC
  Filled 2015-11-27: qty 12

## 2015-11-27 MED ORDER — SODIUM CHLORIDE 0.9 % IV SOLN: 100.0000 mL | INTRAVENOUS | Status: DC | PRN

## 2015-11-27 MED ORDER — HEPARIN SODIUM (PORCINE) 1000 UNIT/ML IJ SOLN
INTRAMUSCULAR | Status: AC
  Filled 2015-11-27: qty 7

## 2015-11-27 MED ORDER — ENOXAPARIN SODIUM 30 MG/0.3ML ~~LOC~~ SOLN
30.0000 mg | SUBCUTANEOUS | Status: DC
  Filled 2015-11-27: qty 0.3

## 2015-11-27 MED ORDER — CEFAZOLIN SODIUM 10 G IJ SOLR
3.0000 g | INTRAMUSCULAR | Status: DC
  Filled 2015-11-27: qty 3000

## 2015-11-27 NOTE — Clinical Social Work Note (Signed)
Clinical Social Work Assessment  Patient Details  Name: [name] MRN: [id number] Date of Birth: 1966-04-16  Date of referral:  11/27/15               Reason for consult:  Other (Comment Required) (dialysis)                Permission sought to share information with:    Permission granted to share information::     Name::        Agency::     Relationship::     Contact Information:     Housing/Transportation Living arrangements for the past 2 months:  Single Family Home Source of Information:  Spouse Patient Interpreter Needed:  None Criminal Activity/Legal Involvement Pertinent to Current Situation/Hospitalization:  No - Comment as needed Significant Relationships:  Adult Children, Spouse Lives with:  Spouse Do you feel safe going back to the place where you live?   (unsure at this time) Need for family participation in patient care:  Yes (Comment)  Care giving concerns:  Will be determined after PT evaluation.    Social Worker assessment / plan:  CSW met with pt's husband in room. Pt currently in dialysis. CSW referred due to new dialysis. Pt lives with husband and they have 3 children. Two sons live locally, but pt's husband indicates that their daughter who lives in [place] is really their best support. CSW discussed d/c planning and he states they haven't talked much about it yet, but are very hopeful to go home. He acknowledges that pt is very weak and at baseline has very limited mobility. She has a BSC that at baseline she is able to transfer to. He states that pt can get into a vehicle with great difficulty. He is aware of [facility] as one possible transport resource if needed. Pt's husband is also concerned that pt has been forgetful and somewhat confused. He plans to share information with children to see how much assistance they would be willing to provide at home. CSW discussed that pt would have PT evaluation when appropriate so that further d/c planning could be  completed. He is aware and agreeable. Pt to have tunneled catheter placed tomorrow. He requests for pt's dialysis to be set up at [facility]. CSW sent referral and spoke with representative confirming information received.   Employment status:  Unemployed Forensic scientist:  Self Pay (Medicaid Pending) PT Recommendations:  Not assessed at this time Information / Referral to community resources:  Other (Comment Required) ([place])  Patient/Family's Response to care:  Pt's husband is very aware of difficulty associated with pt's situation and was very appreciative of visit and assistance.   Patient/Family's Understanding of and Emotional Response to Diagnosis, Current Treatment, and Prognosis:  Pt's husband became emotional during discussion as he shared that pt is scared about dialysis and they are overwhelmed regarding next steps. CSW explained process and provided support.   Emotional Assessment Appearance:  Other (Comment Required (Pt in dialysis at time of visit) Attitude/Demeanor/Rapport:  Unable to Assess Affect (typically observed):  Unable to Assess Orientation:    Alcohol / Substance use:  Not Applicable Psych involvement (Current and /or in the community):  No (Comment)  Discharge Needs  Concerns to be addressed:  Discharge Planning Concerns Readmission within the last 30 days:  No Current discharge risk:  Physical Impairment, Chronically ill Barriers to Discharge:  Continued Medical Work up   Salome Arnt, [place] 11/27/2015, 3:56 PM [phone]

## 2015-11-27 NOTE — Procedures (Signed)
   HEMODIALYSIS TREATMENT NOTE:  4.5 hour heparin-free dialysis completed via right femoral temporary catheter. Goal met: 3.5 liters removed; no interruption in ultrafiltration. All blood was reinfused. Catheter ports saline-locked as cath is to be removed post HD. Report called to [facility], LPN.  Rockwell [place], RN, CDN

## 2015-11-27 NOTE — Progress Notes (Signed)
Patient ID: [name], female   DOB: [birth year], 49 y.o.   MRN: [id number]   Tunneled HD cath placement scheduled for 12/9 at [facility] See orders Pt must be at [place] by ambulance by 1100 am 12/9---will return to [facility] after procedure

## 2015-11-27 NOTE — Care Management Note (Signed)
Case Management Note  Patient Details  Name: [name] MRN: [id number] Date of Birth: 02-15-1966  Subjective/Objective:                    Action/Plan:   Expected Discharge Date:  11/18/15               Expected Discharge Plan:  Home/Self Care  In-House Referral:  Financial Counselor  Discharge planning Services  CM Consult  Post Acute Care Choice:  Home Health, Durable Medical Equipment Choice offered to:  Patient  DME Arranged:    DME Agency:     HH Arranged:    [place] Agency:     Status of Service:  In process, will continue to follow  Medicare Important Message Given:    Date Medicare IM Given:    Medicare IM give by:    Date Additional Medicare IM Given:    Additional Medicare Important Message give by:     If discussed at [place] of Stay Meetings, dates discussed: 11/27/15   Additional Comments:  Joylene Draft, RN 11/27/2015, 1:50 PM

## 2015-11-27 NOTE — Progress Notes (Signed)
Pt refuse to wear BIPAP machine

## 2015-11-27 NOTE — Progress Notes (Signed)
Subjective: She says she feels okay. It appears that she is going to need at least moderate term dialysis. She is going to have dialysis today.  Objective: Vital signs in last 24 hours: Temp:  [98 F (36.7 C)-98.1 F (36.7 C)] 98 F (36.7 C) (12/08 0726) Pulse Rate:  [60-64] 64 (12/08 0726) Resp:  [18-20] 20 (12/08 0726) BP: (121-137)/(70-88) 133/88 mmHg (12/08 0726) SpO2:  [96 %-99 %] 97 % (12/08 0726) Weight:  [208.201 kg (459 lb)] 208.201 kg (459 lb) (12/08 0726) Weight change:  Last BM Date: 11/25/15  Intake/Output from previous day: 12/07 0701 - 12/08 0700 In: 2737.5 [P.O.:520; I.V.:2077.5; IV Piggyback:140] Out: 450 [Urine:450]  PHYSICAL EXAM General appearance: alert, cooperative, mild distress and morbidly obese Resp: diminished breath sounds bilaterally Cardio: regular rate and rhythm, S1, S2 normal, no murmur, click, rub or gallop GI: soft, non-tender; bowel sounds normal; no masses,  no organomegaly Extremities: She has lymphedema and had cellulitis but the cellulitis is better  Lab Results:  Results for orders placed or performed during the hospital encounter of 11/13/15 (from the past 48 hour(s))  Glucose, capillary     Status: Abnormal   Collection Time: 11/25/15 11:43 AM  Result Value Ref Range   Glucose-Capillary 111 (H) 65 - 99 mg/dL  Glucose, capillary     Status: Abnormal   Collection Time: 11/25/15  4:25 PM  Result Value Ref Range   Glucose-Capillary 147 (H) 65 - 99 mg/dL   Comment 1 Notify RN    Comment 2 Document in Chart   Glucose, capillary     Status: Abnormal   Collection Time: 11/25/15  8:39 PM  Result Value Ref Range   Glucose-Capillary 157 (H) 65 - 99 mg/dL   Comment 1 Notify RN    Comment 2 Document in Chart   Basic metabolic panel     Status: Abnormal   Collection Time: 11/26/15  5:25 AM  Result Value Ref Range   Sodium 134 (L) 135 - 145 mmol/L   Potassium 4.3 3.5 - 5.1 mmol/L   Chloride 98 (L) 101 - 111 mmol/L   CO2 30 22 - 32  mmol/L   Glucose, Bld 200 (H) 65 - 99 mg/dL   BUN 70 (H) 6 - 20 mg/dL   Creatinine, Ser 4.28 (H) 0.44 - 1.00 mg/dL   Calcium 8.4 (L) 8.9 - 10.3 mg/dL   GFR calc non Af Amer 11 (L) >60 mL/min   GFR calc Af Amer 13 (L) >60 mL/min    Comment: (NOTE) The eGFR has been calculated using the CKD EPI equation. This calculation has not been validated in all clinical situations. eGFR's persistently <60 mL/min signify possible Chronic Kidney Disease.    Anion gap 6 5 - 15  Glucose, capillary     Status: Abnormal   Collection Time: 11/26/15  7:35 AM  Result Value Ref Range   Glucose-Capillary 166 (H) 65 - 99 mg/dL   Comment 1 Notify RN    Comment 2 Document in Chart   Glucose, capillary     Status: Abnormal   Collection Time: 11/26/15 11:47 AM  Result Value Ref Range   Glucose-Capillary 201 (H) 65 - 99 mg/dL   Comment 1 Notify RN    Comment 2 Document in Chart   Glucose, capillary     Status: Abnormal   Collection Time: 11/26/15  4:20 PM  Result Value Ref Range   Glucose-Capillary 196 (H) 65 - 99 mg/dL   Comment 1 Notify RN  Comment 2 Document in Chart   Glucose, capillary     Status: Abnormal   Collection Time: 11/26/15 10:16 PM  Result Value Ref Range   Glucose-Capillary 180 (H) 65 - 99 mg/dL   Comment 1 Notify RN    Comment 2 Document in Chart   Creatinine, serum     Status: Abnormal   Collection Time: 11/27/15  5:52 AM  Result Value Ref Range   Creatinine, Ser 4.58 (H) 0.44 - 1.00 mg/dL   GFR calc non Af Amer 10 (L) >60 mL/min   GFR calc Af Amer 12 (L) >60 mL/min    Comment: (NOTE) The eGFR has been calculated using the CKD EPI equation. This calculation has not been validated in all clinical situations. eGFR's persistently <60 mL/min signify possible Chronic Kidney Disease.   Glucose, capillary     Status: Abnormal   Collection Time: 11/27/15  7:23 AM  Result Value Ref Range   Glucose-Capillary 160 (H) 65 - 99 mg/dL   Comment 1 Notify RN    Comment 2 Document in  Chart     ABGS No results for input(s): PHART, PO2ART, TCO2, HCO3 in the last 72 hours.  Invalid input(s): PCO2 CULTURES Recent Results (from the past 240 hour(s))  MRSA PCR Screening     Status: None   Collection Time: 11/18/15 11:45 AM  Result Value Ref Range Status   MRSA by PCR NEGATIVE NEGATIVE Final    Comment:        The GeneXpert MRSA Assay (FDA approved for NASAL specimens only), is one component of a comprehensive MRSA colonization surveillance program. It is not intended to diagnose MRSA infection nor to guide or monitor treatment for MRSA infections.    Studies/Results: No results found.  Medications:  Prior to Admission:  Prescriptions prior to admission  Medication Sig Dispense Refill Last Dose  . ALPRAZolam (XANAX) 0.5 MG tablet Take 0.5 mg by mouth 3 (three) times daily as needed for anxiety or sleep.    11/12/2015 at Unknown time  . amLODipine (NORVASC) 10 MG tablet Take 10 mg by mouth daily.   11/12/2015 at Unknown time  . insulin NPH-insulin regular (NOVOLIN 70/30) (70-30) 100 UNIT/ML injection Inject 60 Units into the skin 2 (two) times daily.   11/12/2015 at Unknown time  . metoprolol (LOPRESSOR) 50 MG tablet Take 50 mg by mouth 2 (two) times daily.   11/12/2015 at Unknown time  . Oxycodone HCl 10 MG TABS Take 10 mg by mouth 4 (four) times daily as needed (pain).    Past Week at Unknown time  . potassium chloride SA (K-DUR,KLOR-CON) 20 MEQ tablet Take 20 mEq by mouth 3 (three) times daily.   11/12/2015 at Unknown time  . torsemide (DEMADEX) 100 MG tablet Take 100 mg by mouth 2 (two) times daily.    11/12/2015 at Unknown time  . acetaminophen (TYLENOL) 325 MG tablet Take 650 mg by mouth every 6 (six) hours as needed. fever   unknown  . diphenhydrAMINE (BENADRYL) 25 mg capsule Take 25 mg by mouth every 6 (six) hours as needed for allergies.   unknown  . docusate sodium (COLACE) 100 MG capsule Take 100 mg by mouth 2 (two) times daily as needed. For  constipation   unknown  . HYDROcodone-acetaminophen (NORCO/VICODIN) 5-325 MG per tablet Take 2 tablets by mouth every 4 (four) hours as needed. (Patient not taking: Reported on 12/02/2014) 10 tablet 0   . oseltamivir (TAMIFLU) 75 MG capsule Take 1 capsule (75  mg total) by mouth every 12 (twelve) hours. (Patient not taking: Reported on 12/02/2014) 10 capsule 0   . oxyCODONE-acetaminophen (PERCOCET/ROXICET) 5-325 MG per tablet Take 1 tablet by mouth every 6 (six) hours as needed for severe pain. 15 tablet 0 unknown   Scheduled: . enoxaparin (LOVENOX) injection  30 mg Subcutaneous Q24H  . feeding supplement (PRO-STAT SUGAR FREE 64)  30 mL Oral BID  . furosemide  200 mg Intravenous BID  . insulin aspart  0-20 Units Subcutaneous TID WC  . insulin aspart  0-5 Units Subcutaneous QHS  . metoCLOPramide (REGLAN) injection  5 mg Intravenous 3 times per day  . metolazone  5 mg Oral BID  . metoprolol  50 mg Oral BID  . nystatin   Topical BID  . ondansetron (ZOFRAN) IV  4 mg Intravenous 4 times per day  . sodium chloride  10-40 mL Intracatheter Q12H  . sodium chloride  3 mL Intravenous Q12H  . sodium chloride  3 mL Intravenous Q12H   Continuous:  SEL:TRVUYE chloride, sodium chloride, sodium chloride, sodium chloride, sodium chloride, sodium chloride, sodium chloride, acetaminophen **OR** acetaminophen, acetaminophen, ALPRAZolam, alteplase, alteplase, diphenhydrAMINE, heparin, lidocaine (PF), lidocaine-prilocaine, ondansetron (ZOFRAN) IV, oxyCODONE, pentafluoroprop-tetrafluoroeth, promethazine, sodium chloride, sodium chloride, sodium chloride, sodium chloride  Assesment: She has cellulitis of the leg which has resolved. She has chronic lymphedema. She has morbid obesity. She has obesity hypoventilation syndrome and she does not tolerate BiPAP. I think she is a little confused every morning the cause of this problem. She has acute on chronic renal failure and it appears that she is going to require at least  moderate term dialysis. Principal Problem:   Cellulitis of leg without foot, right Active Problems:   Lymphedema of lower extremity   Diabetes mellitus (HCC)   Morbid obesity (HCC)   Hyperkalemia   Obesity hypoventilation syndrome (HCC)   Chronic diastolic CHF (congestive heart failure) (HCC)   Chronic respiratory failure with hypoxia (HCC)   CKD (chronic kidney disease), stage III   Anemia of chronic disease   Acute on chronic respiratory failure ([place])    Plan: She will dialyze today. Remove the catheter in her groin after that and send her for permacath. She can then have a PT consultation etc. and we can start making plans for home care    LOS: 14 days   Nasri Boakye L 11/27/2015, 8:56 AM

## 2015-11-27 NOTE — Progress Notes (Signed)
Subjective: Patient feels much better today. She denies any nausea no vomiting. Her appetite is good and denies also any difficulty breathing.  Objective: Vital signs in last 24 hours: Temp:  [98 F (36.7 C)-98.1 F (36.7 C)] 98 F (36.7 C) (12/08 0726) Pulse Rate:  [60-64] 64 (12/08 0726) Resp:  [18-20] 20 (12/08 0726) BP: (121-137)/(70-88) 133/88 mmHg (12/08 0726) SpO2:  [96 %-99 %] 97 % (12/08 0726) Weight:  [459 lb (208.201 kg)] 459 lb (208.201 kg) (12/08 0726)  Intake/Output from previous day: 12/07 0701 - 12/08 0700 In: 2737.5 [P.O.:520; I.V.:2077.5; IV Piggyback:140] Out: 450 [Urine:450] Intake/Output this shift:    No results for input(s): HGB in the last 72 hours. No results for input(s): WBC, RBC, HCT, PLT in the last 72 hours.  Recent Labs  11/25/15 0759 11/26/15 0525 11/27/15 0552  NA 135 134*  --   K 4.4 4.3  --   CL 99* 98*  --   CO2 29 30  --   BUN 66* 70*  --   CREATININE 3.88* 4.28* 4.58*  GLUCOSE 135* 200*  --   CALCIUM 8.5* 8.4*  --    No results for input(s): LABPT, INR in the last 72 hours.  Patient presently alert and in no apparent distress. Chest decreased breath sound. Heart exam regular rate and rhythm Abdomen : obese and nontender. Extremities patient with chronic lymphedema   Assessment/Plan:  Problem #1 acute kidney injury superimposed on chronic. Her renal function starts declining after initial sign of some stabilization. Her urine output is also declining even though she is on large dose of Lasix. Patient however at this moment denies any nausea or vomiting. Problem #2 hyperkalemia: Her potassium remains normal . Her potassium is pending from this morning. Problem #3 diabetes: Her blood sugar is reasonably controlled. Problem #4 morbid obesity: Presently she is bed bound.  Problem #5 chronic renal failure: Stage III Problem #6 anemia: Her hemoglobin is low but stable. Presently no new blood work. Problem #7 cellulitis of her left  foot. Patient is on antibiotics: She is a febrile and her white blood cell count is normal. Problem #8 nausea and vomiting. This could be secondary to diabetic gastropathy. Seems to have improved. Plan:1] we'll DC IV fluid 2] we'll dialyze patient today 3] we'll remove femoral catheter after dialysis today. 4] we'll send patient for tunneled catheter placement either tomorrow or the day after tomorrow. 5]We'll check her basic metabolic panel , phosphorus and CBC in the morning     Kedarius Aloisi S 11/27/2015, 7:52 AM

## 2015-11-28 ENCOUNTER — Ambulatory Visit (HOSPITAL_COMMUNITY)
Admission: RE | Admit: 2015-11-28 | Discharge: 2015-11-28 | Disposition: A | Discharge status: Home / Self Care | Payer: MEDICAID | Source: Ambulatory Visit | Attending: Nephrology | Admitting: Nephrology

## 2015-11-28 ENCOUNTER — Encounter (HOSPITAL_COMMUNITY): Payer: Self-pay

## 2015-11-28 DIAGNOSIS — I129 Hypertensive chronic kidney disease with stage 1 through stage 4 chronic kidney disease, or unspecified chronic kidney disease: Secondary | ICD-10-CM | POA: Insufficient documentation

## 2015-11-28 DIAGNOSIS — E669 Obesity, unspecified: Secondary | ICD-10-CM | POA: Insufficient documentation

## 2015-11-28 DIAGNOSIS — E1122 Type 2 diabetes mellitus with diabetic chronic kidney disease: Secondary | ICD-10-CM | POA: Insufficient documentation

## 2015-11-28 DIAGNOSIS — R092 Respiratory arrest: Secondary | ICD-10-CM | POA: Insufficient documentation

## 2015-11-28 DIAGNOSIS — I469 Cardiac arrest, cause unspecified: Secondary | ICD-10-CM | POA: Insufficient documentation

## 2015-11-28 DIAGNOSIS — N19 Unspecified kidney failure: Secondary | ICD-10-CM | POA: Insufficient documentation

## 2015-11-28 DIAGNOSIS — I2510 Atherosclerotic heart disease of native coronary artery without angina pectoris: Secondary | ICD-10-CM | POA: Insufficient documentation

## 2015-11-28 DIAGNOSIS — Z794 Long term (current) use of insulin: Secondary | ICD-10-CM | POA: Insufficient documentation

## 2015-11-28 DIAGNOSIS — J9620 Acute and chronic respiratory failure, unspecified whether with hypoxia or hypercapnia: Secondary | ICD-10-CM

## 2015-11-28 DIAGNOSIS — E875 Hyperkalemia: Secondary | ICD-10-CM | POA: Insufficient documentation

## 2015-11-28 DIAGNOSIS — N179 Acute kidney failure, unspecified: Secondary | ICD-10-CM | POA: Insufficient documentation

## 2015-11-28 DIAGNOSIS — Z9981 Dependence on supplemental oxygen: Secondary | ICD-10-CM | POA: Insufficient documentation

## 2015-11-28 DIAGNOSIS — N189 Chronic kidney disease, unspecified: Secondary | ICD-10-CM | POA: Insufficient documentation

## 2015-11-28 DIAGNOSIS — T8859XA Other complications of anesthesia, initial encounter: Secondary | ICD-10-CM

## 2015-11-28 DIAGNOSIS — J449 Chronic obstructive pulmonary disease, unspecified: Secondary | ICD-10-CM | POA: Insufficient documentation

## 2015-11-28 HISTORY — DX: Other complications of anesthesia, initial encounter: T88.59XA

## 2015-11-28 HISTORY — PX: OTHER SURGICAL HISTORY: SHX169

## 2015-11-28 LAB — BASIC METABOLIC PANEL
ANION GAP: 8 (ref 5–15)
Anion gap: 9 (ref 5–15)
BUN: 59 mg/dL — AB (ref 6–20)
BUN: 55 mg/dL — ABNORMAL HIGH (ref 6–20)
CALCIUM: 8.7 mg/dL — AB (ref 8.9–10.3)
CHLORIDE: 98 mmol/L — AB (ref 101–111)
CO2: 29 mmol/L (ref 22–32)
CO2: 28 mmol/L (ref 22–32)
Calcium: 8.7 mg/dL — ABNORMAL LOW (ref 8.9–10.3)
Chloride: 97 mmol/L — ABNORMAL LOW (ref 101–111)
Creatinine, Ser: 4.08 mg/dL — ABNORMAL HIGH (ref 0.44–1.00)
Creatinine, Ser: 4.28 mg/dL — ABNORMAL HIGH (ref 0.44–1.00)
GFR calc Af Amer: 14 mL/min — ABNORMAL LOW (ref >60)
GFR calc non Af Amer: 11 mL/min — ABNORMAL LOW (ref >60)
GFR, EST AFRICAN AMERICAN: 13 mL/min — AB (ref >60)
GFR, EST NON AFRICAN AMERICAN: 12 mL/min — AB (ref >60)
GLUCOSE: 155 mg/dL — AB (ref 65–99)
Glucose, Bld: 180 mg/dL — ABNORMAL HIGH (ref 65–99)
POTASSIUM: 4.3 mmol/L (ref 3.5–5.1)
POTASSIUM: 4.3 mmol/L (ref 3.5–5.1)
SODIUM: 135 mmol/L (ref 135–145)
Sodium: 134 mmol/L — ABNORMAL LOW (ref 135–145)

## 2015-11-28 LAB — GLUCOSE, CAPILLARY
GLUCOSE-CAPILLARY: 112 mg/dL — AB (ref 65–99)
GLUCOSE-CAPILLARY: 140 mg/dL — AB (ref 65–99)
GLUCOSE-CAPILLARY: 123 mg/dL — AB (ref 65–99)
Glucose-Capillary: 136 mg/dL — ABNORMAL HIGH (ref 65–99)

## 2015-11-28 LAB — TROPONIN I

## 2015-11-28 LAB — CBC
HCT: 32.8 % — ABNORMAL LOW (ref 36.0–46.0)
HCT: 33.2 % — ABNORMAL LOW (ref 36.0–46.0)
HEMOGLOBIN: 10.3 g/dL — AB (ref 12.0–15.0)
Hemoglobin: 10.4 g/dL — ABNORMAL LOW (ref 12.0–15.0)
MCH: 28.3 pg (ref 26.0–34.0)
MCH: 27.5 pg (ref 26.0–34.0)
MCHC: 31.7 g/dL (ref 30.0–36.0)
MCHC: 31 g/dL (ref 30.0–36.0)
MCV: 89.1 fL (ref 78.0–100.0)
MCV: 88.5 fL (ref 78.0–100.0)
PLATELETS: 213 10*3/uL (ref 150–400)
Platelets: 212 10*3/uL (ref 150–400)
RBC: 3.68 MIL/uL — AB (ref 3.87–5.11)
RBC: 3.75 MIL/uL — AB (ref 3.87–5.11)
RDW: 18.7 % — ABNORMAL HIGH (ref 11.5–15.5)
RDW: 18.2 % — ABNORMAL HIGH (ref 11.5–15.5)
WBC: 8 10*3/uL (ref 4.0–10.5)
WBC: 9 10*3/uL (ref 4.0–10.5)

## 2015-11-28 LAB — LACTIC ACID, PLASMA: Lactic Acid, Venous: 0.8 mmol/L (ref 0.5–2.0)

## 2015-11-28 LAB — MRSA PCR SCREENING: MRSA by PCR: NEGATIVE

## 2015-11-28 MED ORDER — FENTANYL CITRATE (PF) 100 MCG/2ML IJ SOLN
INTRAMUSCULAR | Status: AC | PRN
  Administered 2015-11-28: 100 ug via INTRAVENOUS

## 2015-11-28 MED ORDER — PANTOPRAZOLE SODIUM 40 MG PO TBEC
40.0000 mg | DELAYED_RELEASE_TABLET | Freq: Every day | ORAL | Status: DC
  Administered 2015-11-29: 40 mg via ORAL
  Filled 2015-11-28: qty 1

## 2015-11-28 MED ORDER — LIDOCAINE-EPINEPHRINE (PF) 1 %-1:200000 IJ SOLN
INTRAMUSCULAR | Status: AC
  Filled 2015-11-28: qty 30

## 2015-11-28 MED ORDER — FLUMAZENIL 0.5 MG/5ML IV SOLN
INTRAVENOUS | Status: AC
  Filled 2015-11-28: qty 5

## 2015-11-28 MED ORDER — DEXTROSE 5 % IV SOLN: 3.0000 g | INTRAVENOUS | Status: DC

## 2015-11-28 MED ORDER — CEFAZOLIN SODIUM 1-5 GM-% IV SOLN
1.0000 g | INTRAVENOUS | Status: AC
  Filled 2015-11-28: qty 50

## 2015-11-28 MED ORDER — ONDANSETRON HCL 4 MG/2ML IJ SOLN
INTRAMUSCULAR | Status: AC
  Filled 2015-11-28: qty 2

## 2015-11-28 MED ORDER — SODIUM CHLORIDE 0.9 % IJ SOLN
10.0000 mL | Freq: Two times a day (BID) | INTRAMUSCULAR | Status: DC
  Administered 2015-11-28 – 2015-12-02 (×6): 10 mL
  Administered 2015-12-02: 20 mL
  Administered 2015-12-03 – 2015-12-05 (×6): 10 mL

## 2015-11-28 MED ORDER — HEPARIN SODIUM (PORCINE) 1000 UNIT/ML IJ SOLN
INTRAMUSCULAR | Status: AC
  Filled 2015-11-28: qty 1

## 2015-11-28 MED ORDER — MIDAZOLAM HCL 2 MG/2ML IJ SOLN
INTRAMUSCULAR | Status: AC | PRN
  Administered 2015-11-28: 2 mg via INTRAVENOUS

## 2015-11-28 MED ORDER — CEFAZOLIN SODIUM-DEXTROSE 2-3 GM-% IV SOLR
2.0000 g | INTRAVENOUS | Status: AC
  Administered 2015-11-29: 2000 mg via INTRAVENOUS
  Filled 2015-11-28: qty 50

## 2015-11-28 MED ORDER — NALOXONE HCL 2 MG/2ML IJ SOSY
PREFILLED_SYRINGE | INTRAMUSCULAR | Status: AC
  Filled 2015-11-28: qty 2

## 2015-11-28 MED ORDER — FENTANYL CITRATE (PF) 100 MCG/2ML IJ SOLN
INTRAMUSCULAR | Status: AC
  Filled 2015-11-28: qty 2

## 2015-11-28 MED ORDER — SODIUM CHLORIDE 0.9 % IJ SOLN: 10.0000 mL | INTRAMUSCULAR | Status: DC | PRN

## 2015-11-28 MED ORDER — MIDAZOLAM HCL 2 MG/2ML IJ SOLN
INTRAMUSCULAR | Status: AC
  Filled 2015-11-28: qty 2

## 2015-11-28 NOTE — Care Management Note (Signed)
Case Management Note  Patient Details  Name: [name] MRN: [id number] Date of Birth: Mar 26, 1966  Subjective/Objective:                    Action/Plan:   Expected Discharge Date:  11/18/15               Expected Discharge Plan:  [place]  In-House Referral:  Development worker, community, Clinical Social Work  Discharge planning Services  CM Consult, [facility] Program  Post Acute Care Choice:  Home Health, Durable Medical Equipment Choice offered to:  Patient  DME Arranged:   Optician, dispensing) DME Agency:  [place]:  RN, PT, Nurse's Aide [place] Agency:  [place]  Status of Service:  In process, will continue to follow  Medicare Important Message Given:    Date Medicare IM Given:    Medicare IM give by:    Date Additional Medicare IM Given:    Additional Medicare Important Message give by:     If discussed at [place] of Stay Meetings, dates discussed:    Additional Comments: CM spoke with pts husband about discharge plans. Pts husband would like to wait on hospital bed. Husband would like hoyer lift and RN, PT, and aide with [facility] (as pt has no insurance). Pt having tunneled catheter placed today for dialysis. CSW is working on arranging outpt dialysis. Anticipate discharge once dialysis arrangements are made. Christinia Gully [place], RN 11/28/2015, 11:18 AM

## 2015-11-28 NOTE — Consult Note (Signed)
Chief Complaint: Patient was seen in consultation today for tunneled hemodialysis catheter placement at the request of [place]  Referring Physician(s): Befekadu,Belayenh  History of Present Illness: [name] is a 49 y.o. female   Admitted 11/24 to [facility] with weakness; LE edema Treated for cellulitis Hyperkalemia; DM; HTN; obesity Acute renal injury/chronic renal failure--need for dialysis determined Dialysis through Rt femoral temp cath placed 11/17/15 per Dr Aviva Signs  Worsening renal function regardless dialysis Request for tunneled dialysis catheter placement   Past Medical History  Diagnosis Date  . Diabetes mellitus   . Chronic pain   . Lymph edema   . Cellulitis   . Chiari malformation     s/p surgery  . S/P colonoscopy 05/26/2007    Dr. Laural Golden sigmoid diverticulosis random biopsies benign  . CAD (coronary artery disease)   . Hypercholesterolemia   . Hypertension   . COPD (chronic obstructive pulmonary disease) ([place])   . S/P endoscopy 05/01/2009    Dr. Penelope Coop pill-induced esophageal ulcerations distal to midesophagus, 2 small ulcers in the antrum of the stomach  . Fibromyalgia   . On home O2   . Obesity hypoventilation syndrome ([place])   . Hx of echocardiogram 10/2011    EF 55-60%    Past Surgical History  Procedure Laterality Date  . Cesarean section       x 2  . Abdominal hysterectomy    . Tonsillectomy    . Adenoidectomy    . Thyroidectomy, partial    . Craniectomy suboccipital w/ cervical laminectomy / chiari    . Portacath placement  07/05/2012    Procedure: INSERTION PORT-A-CATH;  Surgeon: Donato Heinz, MD;  Location: [facility];  Service: General;  Laterality: Left;  subclavian    Allergies: Vancomycin; Contrast media; Pineapple; Pneumococcal vaccines; and Shellfish allergy  Medications: Prior to Admission medications   Medication Sig Start Date End Date Taking? Authorizing Provider  acetaminophen (TYLENOL) 325 MG tablet  Take 650 mg by mouth every 6 (six) hours as needed. fever    Historical Provider, MD  ALPRAZolam Duanne Moron) 0.5 MG tablet Take 0.5 mg by mouth 3 (three) times daily as needed for anxiety or sleep.     Historical Provider, MD  amLODipine (NORVASC) 10 MG tablet Take 10 mg by mouth daily.    Historical Provider, MD  diphenhydrAMINE (BENADRYL) 25 mg capsule Take 25 mg by mouth every 6 (six) hours as needed for allergies.    Historical Provider, MD  docusate sodium (COLACE) 100 MG capsule Take 100 mg by mouth 2 (two) times daily as needed. For constipation    Historical Provider, MD  HYDROcodone-acetaminophen (NORCO/VICODIN) 5-325 MG per tablet Take 2 tablets by mouth every 4 (four) hours as needed. Patient not taking: Reported on 12/02/2014 11/06/14   Noemi Chapel, MD  insulin NPH-insulin regular (NOVOLIN 70/30) (70-30) 100 UNIT/ML injection Inject 60 Units into the skin 2 (two) times daily.    Historical Provider, MD  metoprolol (LOPRESSOR) 50 MG tablet Take 50 mg by mouth 2 (two) times daily.    Historical Provider, MD  oseltamivir (TAMIFLU) 75 MG capsule Take 1 capsule (75 mg total) by mouth every 12 (twelve) hours. Patient not taking: Reported on 12/02/2014 11/06/14   Noemi Chapel, MD  Oxycodone HCl 10 MG TABS Take 10 mg by mouth 4 (four) times daily as needed (pain).     Historical Provider, MD  oxyCODONE-acetaminophen (PERCOCET/ROXICET) 5-325 MG per tablet Take 1 tablet by mouth every 6 (six) hours  as needed for severe pain. 09/11/15   Merryl Hacker, MD  potassium chloride SA (K-DUR,KLOR-CON) 20 MEQ tablet Take 20 mEq by mouth 3 (three) times daily.    Historical Provider, MD  torsemide (DEMADEX) 100 MG tablet Take 100 mg by mouth 2 (two) times daily.     Historical Provider, MD     Family History  Problem Relation Age of Onset  . Colon cancer Mother 1  . Stroke Mother 31  . Coronary artery disease Mother     Social History   Social History  . Marital Status: Married    Spouse Name:  N/A  . Number of Children: 3  . Years of Education: N/A   Social History Main Topics  . Smoking status: Never Smoker   . Smokeless tobacco: Never Used  . Alcohol Use: No  . Drug Use: No  . Sexual Activity: No   Other Topics Concern  . 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  All other systems are negative.  Review of Systems  Constitutional: Positive for activity change, appetite change and fatigue. Negative for fever.  Cardiovascular: Negative for chest pain.  Gastrointestinal: Negative for abdominal pain.  Genitourinary: Positive for decreased urine volume.  Musculoskeletal: Positive for gait problem.  Neurological: Positive for dizziness, weakness and light-headedness.  Psychiatric/Behavioral: Negative for confusion.    Vital Signs: BP 136/73 mmHg  Pulse 62  Resp 14  SpO2 100%  Physical Exam  Constitutional: She is oriented to person, place, and time.  Morbid obese  Cardiovascular: Normal rate and regular rhythm.   Pulmonary/Chest: Effort normal. She has wheezes.  Abdominal: Soft. Bowel sounds are normal. She exhibits distension. There is no tenderness.  Musculoskeletal: Normal range of motion.  Neurological: She is alert and oriented to person, place, and time.  Skin: Skin is warm and dry.  Psychiatric: She has a normal mood and affect. Her behavior is normal. Judgment and thought content normal.  Sister at bedside  Nursing note and vitals reviewed.   Mallampati Score:  MD Evaluation Airway: WNL Heart: WNL Abdomen: WNL Chest/ Lungs: WNL ASA  Classification: 3 Mallampati/Airway Score: Two  Imaging: US Renal  11/17/2015  CLINICAL DATA:  49 year old female with chronic renal failure, beginning dialysis. Subsequent encounter. EXAM: RENAL / URINARY TRACT ULTRASOUND COMPLETE COMPARISON:  Noncontrast CT Abdomen and Pelvis  12/02/2014 FINDINGS: Large body habitus. Right Kidney: Length: 13.6 cm. Echogenicity within normal limits. No mass or hydronephrosis visualized. Left Kidney: Length: Could not be identified despite multiple attempts. Bladder: Decompressed via Foley catheter. IMPRESSION: Negative sonographic appearance of the right kidney. The left kidney could not be visualized despite multiple attempts. Electronically Signed   By: Genevie Ann M.D.   On: 11/17/2015 11:10   Dg Chest Portable 1 View  11/13/2015  CLINICAL DATA:  Shortness of Breath EXAM: PORTABLE CHEST 1 VIEW COMPARISON:  December 02, 2014 FINDINGS: Port-A-Cath tip is in the superior vena cava. No pneumothorax. There is cardiomegaly with pulmonary venous hypertension. There is no frank edema or consolidation. No adenopathy. IMPRESSION: Pulmonary vascular congestion.  No frank edema or consolidation. Electronically Signed   By: Lowella Grip III M.D.   On: 11/13/2015 15:45    Labs:  CBC:  Recent Labs  11/21/15 1134 11/23/15 0608 11/27/15 0552 11/28/15 0608  WBC 11.4* 8.1 9.3 8.0  HGB 10.5* 10.3*  10.4* 10.4*  HCT 32.0* 31.9* 32.7* 32.8*  PLT 216 225 224 213    COAGS:  Recent Labs  11/27/15 1314  INR 1.28  APTT 36    BMP:  Recent Labs  11/24/15 0731 11/25/15 0759 11/26/15 0525 11/27/15 0552 11/28/15 0608  NA 136 135 134*  --  134*  K 4.3 4.4 4.3  --  4.3  CL 100* 99* 98*  --  97*  CO2 _0 --  29  GLUCOSE 129* 135* 200*  --  155*  BUN 59* 66* 70*  --  59*  CALCIUM 8.3* 8.5* 8.4*  --  8.7*  CREATININE 3.91* 3.88* 4.28* 4.58* 4.08*  GFRNONAA 13* 13* 11* 10* 12*  GFRAA 14* 15* 13* 12* 14*    LIVER FUNCTION TESTS:  Recent Labs  12/02/14 2009 11/13/15 1500 11/17/15 0948 11/21/15 0627  BILITOT 0.7 0.8  --  1.4*  AST 20 13*  --  21  ALT 13 10* 19 24  ALKPHOS 143* 134*  --  110  PROT 8.6* 7.3  --  7.5  ALBUMIN 3.4* 2.8*  --  3.1*    TUMOR MARKERS: No results for input(s): AFPTM, CEA, CA199, CHROMGRNA in  the last 8760 hours.  Assessment and Plan:  Worsening renal fxn post AKI on CKF Rt fem temp cath in place since 11/17/15 No recovery Scheduled for tunneled dialysis catheter placement Risks and Benefits discussed with the patient including, but not limited to bleeding, infection, vascular injury, pneumothorax which may require chest tube placement, air embolism or even death All of the patient's questions were answered, patient is agreeable to proceed. Consent signed and in chart.   Thank you for this interesting consult.  I greatly enjoyed meeting YINA RIVIERE and look forward to participating in their care.  A copy of this report was sent to the requesting provider on this date.  Signed: Annaliza Zia A 11/28/2015, 11:52 AM   I spent a total of 20 Minutes    in face to face in clinical consultation, greater than 50% of which was counseling/coordinating care for tunneled dialysis catheter placement

## 2015-11-28 NOTE — Progress Notes (Signed)
Patient ID: [name], female   DOB: 11-25-1966, 49 y.o.   MRN: [id number]  Patient transferred down from [facility] for HD cath insertion.  She received 75m versed and 100 umg Fentanyl for conscious sedation.  She shortly there after had a resp. Arrest.  This was recognized immediately.  She received Narcan and romazicon to reverse her.  Face shield O2 and 2 rounds of chest compressions for a weak thready radial pulse.  She slowly aroused and awakened after approx 2 mins.  She is now Hemodynamically stable and following commands.  Procedure aborted.  tx to ICU for observation.  D/W daughter and Husband.  All questions answered.  Events reviewed with Dr. RChase Callerwith critical care.

## 2015-11-28 NOTE — Clinical Social Work Note (Signed)
CSW discussed financial forms and release of information from [facility]. Pt assisted in completion and signed. These were faxed to [facility]. CSW also discussed PT recommendation for SNF. Both pt and husband request that home be considered first with SNF only if absolutely necessary. Pt's husband spoke with son, Randall Hiss last night and they report he is willing to help out at home. They are aware that pt will require lift for transfers and husband indicates that they will need to re-arrange some things at home to accommodate this. CSW encouraged them to do that this weekend to prepare for return home. Updated Development worker, community.   Benay Pike, [place]

## 2015-11-28 NOTE — Progress Notes (Signed)
Subjective: Patient offers no complaints. Patient denies any nausea or vomiting. Denies also any difficulty breathing.  Objective: Vital signs in last 24 hours: Temp:  [97.7 F (36.5 C)-97.9 F (36.6 C)] 97.7 F (36.5 C) (12/09 0602) Pulse Rate:  [58-68] 59 (12/09 0602) Resp:  [16-20] 16 (12/09 0602) BP: (118-160)/(50-87) 122/69 mmHg (12/09 0602) SpO2:  [94 %-98 %] 97 % (12/09 0602) Weight:  [430 lb 1.9 oz (195.1 kg)-437 lb (198.222 kg)] 430 lb 1.9 oz (195.1 kg) (12/08 1615)  Intake/Output from previous day: 12/08 0701 - 12/09 0700 In: 2335.5 [P.O.:720; I.V.:1475.5; IV Piggyback:140] Out: 3600 [Urine:100] Intake/Output this shift:     Recent Labs  11/27/15 0552 11/28/15 0608  HGB 10.4* 10.4*    Recent Labs  11/27/15 0552 11/28/15 0608  WBC 9.3 8.0  RBC 3.69* 3.68*  HCT 32.7* 32.8*  PLT 224 213    Recent Labs  11/26/15 0525 11/27/15 0552 11/28/15 0608  NA 134*  --  134*  K 4.3  --  4.3  CL 98*  --  97*  CO2 30  --  29  BUN 70*  --  59*  CREATININE 4.28* 4.58* 4.08*  GLUCOSE 200*  --  155*  CALCIUM 8.4*  --  8.7*    Recent Labs  11/27/15 1314  INR 1.28    Patient somewhat sleepy but arousable. Chest decreased breath sound. Heart exam regular rate and rhythm Abdomen : obese and nontender. Extremities patient with chronic lymphedema   Assessment/Plan:  Problem #1 acute kidney injury superimposed on chronic. Patient status post hemodialysis for 4 hours and half yesterday. We're able to move about 3 and half liters. Presently she is asymptomatic.  Problem #2 hyperkalemia: Her potassium remains normal . potassium is pending from this morning. Problem #3 diabetes: Her blood sugar is reasonably controlled. Problem #4 morbid obesity: Presently she is bed bound.  Problem #5 chronic renal failure: Stage III Problem #6 anemia: Her hemoglobin is with  in our target goal. Problem #7 cellulitis of her left foot. Patient is on antibiotics: She is a febrile and  her white blood cell count is normal. Problem #8 nausea and vomiting. Patient still has some nausea but no vomiting. She is feeling much better. Plan:1] patient will have tunneled catheter today 2] we'll do dialysis tomorrow for 4 hours.  3]We'll check her basic metabolic,Phosphorus and CBC in the morning     Tandi Hanko S 11/28/2015, 8:22 AM

## 2015-11-28 NOTE — Progress Notes (Signed)
Patient ID: [name], female   DOB: Sep 14, 1966, 49 y.o.   MRN: [id number] Patient stable and resting on BiPAP. Alert and responsive HD stable Will assess in AM regarding Permcath placement under local only  Events discussed again with pt and family. All questions answered.

## 2015-11-28 NOTE — Progress Notes (Signed)
Utilization review completed

## 2015-11-28 NOTE — Progress Notes (Signed)
Subjective: She is scheduled for permacath placement today. This morning she is a little sleepy and I suspect that's because she's been refusing to use BiPAP at night. She says she will try again. She has no other new complaints.  Objective: Vital signs in last 24 hours: Temp:  [97.7 F (36.5 C)-97.9 F (36.6 C)] 97.7 F (36.5 C) (12/09 0602) Pulse Rate:  [58-68] 59 (12/09 0602) Resp:  [16-20] 16 (12/09 0602) BP: (118-160)/(50-87) 122/69 mmHg (12/09 0602) SpO2:  [94 %-98 %] 97 % (12/09 0602) Weight:  [195.1 kg (430 lb 1.9 oz)-198.222 kg (437 lb)] 195.1 kg (430 lb 1.9 oz) (12/08 1615) Weight change:  Last BM Date: 11/25/15  Intake/Output from previous day: 12/08 0701 - 12/09 0700 In: 2335.5 [P.O.:720; I.V.:1475.5; IV Piggyback:140] Out: 3600 [Urine:100]  PHYSICAL EXAM General appearance: alert, cooperative and mild distress Resp: clear to auscultation bilaterally Cardio: regular rate and rhythm, S1, S2 normal, no murmur, click, rub or gallop GI: soft, non-tender; bowel sounds normal; no masses,  no organomegaly Extremities: extremities normal, atraumatic, no cyanosis or edema  Lab Results:  Results for orders placed or performed during the hospital encounter of 11/13/15 (from the past 48 hour(s))  Glucose, capillary     Status: Abnormal   Collection Time: 11/26/15 11:47 AM  Result Value Ref Range   Glucose-Capillary 201 (H) 65 - 99 mg/dL   Comment 1 Notify RN    Comment 2 Document in Chart   Glucose, capillary     Status: Abnormal   Collection Time: 11/26/15  4:20 PM  Result Value Ref Range   Glucose-Capillary 196 (H) 65 - 99 mg/dL   Comment 1 Notify RN    Comment 2 Document in Chart   Glucose, capillary     Status: Abnormal   Collection Time: 11/26/15 10:16 PM  Result Value Ref Range   Glucose-Capillary 180 (H) 65 - 99 mg/dL   Comment 1 Notify RN    Comment 2 Document in Chart   Creatinine, serum     Status: Abnormal   Collection Time: 11/27/15  5:52 AM  Result  Value Ref Range   Creatinine, Ser 4.58 (H) 0.44 - 1.00 mg/dL   GFR calc non Af Amer 10 (L) >60 mL/min   GFR calc Af Amer 12 (L) >60 mL/min    Comment: (NOTE) The eGFR has been calculated using the CKD EPI equation. This calculation has not been validated in all clinical situations. eGFR's persistently <60 mL/min signify possible Chronic Kidney Disease.   CBC     Status: Abnormal   Collection Time: 11/27/15  5:52 AM  Result Value Ref Range   WBC 9.3 4.0 - 10.5 K/uL   RBC 3.69 (L) 3.87 - 5.11 MIL/uL   Hemoglobin 10.4 (L) 12.0 - 15.0 g/dL   HCT 32.7 (L) 36.0 - 46.0 %   MCV 88.6 78.0 - 100.0 fL   MCH 28.2 26.0 - 34.0 pg   MCHC 31.8 30.0 - 36.0 g/dL   RDW 18.5 (H) 11.5 - 15.5 %   Platelets 224 150 - 400 K/uL  Glucose, capillary     Status: Abnormal   Collection Time: 11/27/15  7:23 AM  Result Value Ref Range   Glucose-Capillary 160 (H) 65 - 99 mg/dL   Comment 1 Notify RN    Comment 2 Document in Chart   Protime-INR     Status: Abnormal   Collection Time: 11/27/15  1:14 PM  Result Value Ref Range   Prothrombin Time 16.2 (H)  11.6 - 15.2 seconds   INR 1.28 0.00 - 1.49  APTT     Status: None   Collection Time: 11/27/15  1:14 PM  Result Value Ref Range   aPTT 36 24 - 37 seconds  Glucose, capillary     Status: Abnormal   Collection Time: 11/27/15  4:33 PM  Result Value Ref Range   Glucose-Capillary 158 (H) 65 - 99 mg/dL   Comment 1 Notify RN    Comment 2 Document in Chart   Glucose, capillary     Status: Abnormal   Collection Time: 11/27/15  9:47 PM  Result Value Ref Range   Glucose-Capillary 136 (H) 65 - 99 mg/dL   Comment 1 Notify RN    Comment 2 Document in Chart   Basic metabolic panel     Status: Abnormal   Collection Time: 11/28/15  6:08 AM  Result Value Ref Range   Sodium 134 (L) 135 - 145 mmol/L   Potassium 4.3 3.5 - 5.1 mmol/L   Chloride 97 (L) 101 - 111 mmol/L   CO2 29 22 - 32 mmol/L   Glucose, Bld 155 (H) 65 - 99 mg/dL   BUN 59 (H) 6 - 20 mg/dL   Creatinine,  Ser 4.08 (H) 0.44 - 1.00 mg/dL   Calcium 8.7 (L) 8.9 - 10.3 mg/dL   GFR calc non Af Amer 12 (L) >60 mL/min   GFR calc Af Amer 14 (L) >60 mL/min    Comment: (NOTE) The eGFR has been calculated using the CKD EPI equation. This calculation has not been validated in all clinical situations. eGFR's persistently <60 mL/min signify possible Chronic Kidney Disease.    Anion gap 8 5 - 15  CBC     Status: Abnormal   Collection Time: 11/28/15  6:08 AM  Result Value Ref Range   WBC 8.0 4.0 - 10.5 K/uL   RBC 3.68 (L) 3.87 - 5.11 MIL/uL   Hemoglobin 10.4 (L) 12.0 - 15.0 g/dL   HCT 32.8 (L) 36.0 - 46.0 %   MCV 89.1 78.0 - 100.0 fL   MCH 28.3 26.0 - 34.0 pg   MCHC 31.7 30.0 - 36.0 g/dL   RDW 18.7 (H) 11.5 - 15.5 %   Platelets 213 150 - 400 K/uL  Glucose, capillary     Status: Abnormal   Collection Time: 11/28/15  8:10 AM  Result Value Ref Range   Glucose-Capillary 112 (H) 65 - 99 mg/dL   Comment 1 Notify RN    Comment 2 Document in Chart     ABGS No results for input(s): PHART, PO2ART, TCO2, HCO3 in the last 72 hours.  Invalid input(s): PCO2 CULTURES Recent Results (from the past 240 hour(s))  MRSA PCR Screening     Status: None   Collection Time: 11/18/15 11:45 AM  Result Value Ref Range Status   MRSA by PCR NEGATIVE NEGATIVE Final    Comment:        The GeneXpert MRSA Assay (FDA approved for NASAL specimens only), is one component of a comprehensive MRSA colonization surveillance program. It is not intended to diagnose MRSA infection nor to guide or monitor treatment for MRSA infections.    Studies/Results: No results found.  Medications:  Prior to Admission:  Prescriptions prior to admission  Medication Sig Dispense Refill Last Dose  . ALPRAZolam (XANAX) 0.5 MG tablet Take 0.5 mg by mouth 3 (three) times daily as needed for anxiety or sleep.    11/12/2015 at Unknown time  . amLODipine (  NORVASC) 10 MG tablet Take 10 mg by mouth daily.   11/12/2015 at Unknown time  .  insulin NPH-insulin regular (NOVOLIN 70/30) (70-30) 100 UNIT/ML injection Inject 60 Units into the skin 2 (two) times daily.   11/12/2015 at Unknown time  . metoprolol (LOPRESSOR) 50 MG tablet Take 50 mg by mouth 2 (two) times daily.   11/12/2015 at Unknown time  . Oxycodone HCl 10 MG TABS Take 10 mg by mouth 4 (four) times daily as needed (pain).    Past Week at Unknown time  . potassium chloride SA (K-DUR,KLOR-CON) 20 MEQ tablet Take 20 mEq by mouth 3 (three) times daily.   11/12/2015 at Unknown time  . torsemide (DEMADEX) 100 MG tablet Take 100 mg by mouth 2 (two) times daily.    11/12/2015 at Unknown time  . acetaminophen (TYLENOL) 325 MG tablet Take 650 mg by mouth every 6 (six) hours as needed. fever   unknown  . diphenhydrAMINE (BENADRYL) 25 mg capsule Take 25 mg by mouth every 6 (six) hours as needed for allergies.   unknown  . docusate sodium (COLACE) 100 MG capsule Take 100 mg by mouth 2 (two) times daily as needed. For constipation   unknown  . HYDROcodone-acetaminophen (NORCO/VICODIN) 5-325 MG per tablet Take 2 tablets by mouth every 4 (four) hours as needed. (Patient not taking: Reported on 12/02/2014) 10 tablet 0   . oseltamivir (TAMIFLU) 75 MG capsule Take 1 capsule (75 mg total) by mouth every 12 (twelve) hours. (Patient not taking: Reported on 12/02/2014) 10 capsule 0   . oxyCODONE-acetaminophen (PERCOCET/ROXICET) 5-325 MG per tablet Take 1 tablet by mouth every 6 (six) hours as needed for severe pain. 15 tablet 0 unknown   Scheduled: .  ceFAZolin (ANCEF) IV  2 g Intravenous to XRAY   Followed by  .  ceFAZolin (ANCEF) IV  1 g Intravenous to XRAY  . enoxaparin (LOVENOX) injection  30 mg Subcutaneous Q24H  . feeding supplement (PRO-STAT SUGAR FREE 64)  30 mL Oral BID  . furosemide  200 mg Intravenous BID  . insulin aspart  0-20 Units Subcutaneous TID WC  . insulin aspart  0-5 Units Subcutaneous QHS  . metoCLOPramide (REGLAN) injection  5 mg Intravenous 3 times per day  .  metolazone  5 mg Oral BID  . metoprolol  50 mg Oral BID  . nystatin   Topical BID  . ondansetron (ZOFRAN) IV  4 mg Intravenous 4 times per day  . sodium chloride  10-40 mL Intracatheter Q12H  . sodium chloride  3 mL Intravenous Q12H  . sodium chloride  3 mL Intravenous Q12H   Continuous:  YQM:VHQION chloride, sodium chloride, sodium chloride, sodium chloride, sodium chloride, sodium chloride, sodium chloride, sodium chloride, sodium chloride, acetaminophen **OR** acetaminophen, acetaminophen, ALPRAZolam, alteplase, alteplase, diphenhydrAMINE, heparin, lidocaine (PF), lidocaine-prilocaine, ondansetron (ZOFRAN) IV, oxyCODONE, pentafluoroprop-tetrafluoroeth, promethazine, sodium chloride, sodium chloride, sodium chloride, sodium chloride  Assesment: She was admitted with cellulitis of the leg which is related to her chronic lymphedema. The cellulitis is better. She has morbid obesity and obesity hypoventilation. She is frequently mildly confused in the mornings because she won't wear her BiPAP. She has chronic renal failure and is going to get a permacath for dialysis Principal Problem:   Cellulitis of leg without foot, right Active Problems:   Lymphedema of lower extremity   Diabetes mellitus (HCC)   Morbid obesity (HCC)   Hyperkalemia   Obesity hypoventilation syndrome (HCC)   Chronic diastolic CHF (congestive heart failure) ([place])  Chronic respiratory failure with hypoxia (HCC)   CKD (chronic kidney disease), stage III   Anemia of chronic disease   Acute on chronic respiratory failure ([place])    Plan: For permacath placement. She says she will try BiPAP tonight. PT consultation once a permacath is placed and we get the central venous line out    LOS: 15 days   Miles Leyda L 11/28/2015, 9:07 AM

## 2015-11-28 NOTE — Progress Notes (Signed)
RT note: Responded to initial Call of Rapid Response, upon arrival, pt was being ventilated via AMBU bag by IR staff. RT took over to ventilate. Pt had oral airway already placed. MD initiated compressions. Code Blue was called. After Narcan, pt began breathing spontaneously, became more responsive. Pt was then placed on NRB. She was transported to 2M07 on NRB with RT, Rapid Response RN, IR RN without complication.

## 2015-11-28 NOTE — Consult Note (Addendum)
[facility] Consultation Note Requesting Physician:  Dr. Chase Caller Reason for Consult:  Renal failure, s/p arrest in IR during attempted [facility] placement Primary Nephrologist: Dr. Hinda Lenis HPI: The patient is a 49 y.o. year-old  AAF with a background of MO, chronic lymphedema with elephantiasis, diastolic dysfunction, baseline CKD 3/4 (baseline creatinines available during 2016 appear to have been running 1.6-2.3; felt d/t DM). She was admitted to [facility] on 11/13/15 with RLE cellulitus, worsening edema, and creatinine on 1.62 - developed AKI on CKD with associated hyperkalemia. Was diuretic unresponsive and d/t N/V could not tolerate conservative Rx for hyperkalemia.  It appears she received HD on 11/28, 11/29, 12/2, 12/8 via a temporary right femoral catheter placed 11/28. She was transferred here today ostensibly for placement of TDC in IR and was then to be transferred back to [facility].  She received versed and fentanyl for conscious sedation and shortly afterward had a respiratory followed by cardiac arrest. She received Narcan, romazicon and brief chest compressions and was said to have wakened after about 2 minutes.  She is transferred to the ICU for observation. The current plan is to tray again to place catheter in IR without sedation and if stable over next few days transfer back to [facility].  She is presently in ICU, on BIPAP.  Creatinine trending ober the past year for reference is as follows: 11/28/2015 06:08 AM 4.08* 0.44 - 1.00 mg/dL Final  11/27/2015 05:52 AM 4.58*   HD 0.44 - 1.00 mg/dL Final  11/26/2015 05:25 AM 4.28* 0.44 - 1.00 mg/dL Final  11/25/2015 07:59 AM 3.88* 0.44 - 1.00 mg/dL Final  11/24/2015 07:31 AM 3.91* 0.44 - 1.00 mg/dL Final  11/23/2015 06:08 AM 3.82* 0.44 - 1.00 mg/dL Final  11/21/2015 06:27 AM 4.25*   HD 0.44 - 1.00 mg/dL Final  11/20/2015 05:10 AM 4.08* 0.44 - 1.00 mg/dL Final  11/19/2015 08:00 AM 3.87*   HD 0.44 - 1.00 mg/dL Final  11/18/2015 06:13 AM 4.32*   HD  0.44 - 1.00 mg/dL Final  11/17/2015 05:51 AM 4.13* 0.44 - 1.00 mg/dL Final  11/16/2015 06:09 AM 3.52* 0.44 - 1.00 mg/dL Final  11/15/2015 07:16 AM 3.05* 0.44 - 1.00 mg/dL Final  11/14/2015 11:51 AM 2.30* 0.44 - 1.00 mg/dL Final  11/14/2015 05:38 AM 1.98* 0.44 - 1.00 mg/dL Final  11/13/2015 03:00 PM 1.62* 0.44 - 1.00 mg/dL Final  12/02/2014 08:09 PM 1.33* 0.50 - 1.10 mg/dL Final  11/20/2011 05:00 AM 2.91* 0.50 - 1.10 mg/dL Final    Past Medical History  Diagnosis Date  . Diabetes mellitus   . Chronic pain   . Lymph edema   . Cellulitis   . Chiari malformation     s/p surgery  . S/P colonoscopy 05/26/2007    Dr. Laural Golden sigmoid diverticulosis random biopsies benign  . CAD (coronary artery disease)   . Hypercholesterolemia   . Hypertension   . COPD (chronic obstructive pulmonary disease) ([place])   . S/P endoscopy 05/01/2009    Dr. Penelope Coop pill-induced esophageal ulcerations distal to midesophagus, 2 small ulcers in the antrum of the stomach  . Fibromyalgia   . On home O2   . Obesity hypoventilation syndrome ([place])   . Hx of echocardiogram 10/2011    EF 55-60%    Past Surgical History  Procedure Laterality Date  . Cesarean section       x 2  . Abdominal hysterectomy    . Tonsillectomy    . Adenoidectomy    . Thyroidectomy, partial    .  Craniectomy suboccipital w/ cervical laminectomy / chiari    . Portacath placement  07/05/2012    Procedure: INSERTION PORT-A-CATH;  Surgeon: Donato Heinz, MD;  Location: [facility];  Service: General;  Laterality: Left;  subclavian     Family History  Problem Relation Age of Onset  . Colon cancer Mother 43  . Stroke Mother 85  . Coronary artery disease Mother    Social History:  reports that she has never smoked. She has never used smokeless tobacco. She reports that she does not drink alcohol or use illicit drugs.   Allergies  Allergen Reactions  . Vancomycin Nausea And Vomiting    Infusion "made me feel like I was dying" had to be  readmitted to hospital  . Contrast Media [Iodinated Diagnostic Agents] Hives and Swelling    Dye for cardiac cath. Tongue swells  . Pineapple Itching and Swelling  . Pneumococcal Vaccines Swelling    Turns skin black, and swelling  . Shellfish Allergy Itching and Swelling    Home medications: Prior to Admission medications   Medication Sig Start Date End Date Taking? Authorizing Provider  ALPRAZolam Duanne Moron) 0.5 MG tablet Take 0.5 mg by mouth 3 (three) times daily as needed for anxiety or sleep.    Yes Historical Provider, MD  amLODipine (NORVASC) 10 MG tablet Take 10 mg by mouth daily.   Yes Historical Provider, MD  insulin NPH-insulin regular (NOVOLIN 70/30) (70-30) 100 UNIT/ML injection Inject 60 Units into the skin 2 (two) times daily.   Yes Historical Provider, MD  metoprolol (LOPRESSOR) 50 MG tablet Take 50 mg by mouth 2 (two) times daily.   Yes Historical Provider, MD  Oxycodone HCl 10 MG TABS Take 10 mg by mouth 4 (four) times daily as needed (pain).    Yes Historical Provider, MD  potassium chloride SA (K-DUR,KLOR-CON) 20 MEQ tablet Take 20 mEq by mouth 3 (three) times daily.   Yes Historical Provider, MD  torsemide (DEMADEX) 100 MG tablet Take 100 mg by mouth 2 (two) times daily.    Yes Historical Provider, MD  acetaminophen (TYLENOL) 325 MG tablet Take 650 mg by mouth every 6 (six) hours as needed. fever    Historical Provider, MD  diphenhydrAMINE (BENADRYL) 25 mg capsule Take 25 mg by mouth every 6 (six) hours as needed for allergies.    Historical Provider, MD  docusate sodium (COLACE) 100 MG capsule Take 100 mg by mouth 2 (two) times daily as needed. For constipation    Historical Provider, MD  HYDROcodone-acetaminophen (NORCO/VICODIN) 5-325 MG per tablet Take 2 tablets by mouth every 4 (four) hours as needed. Patient not taking: Reported on 12/02/2014 11/06/14   Noemi Chapel, MD  oseltamivir (TAMIFLU) 75 MG capsule Take 1 capsule (75 mg total) by mouth every 12 (twelve)  hours. Patient not taking: Reported on 12/02/2014 11/06/14   Noemi Chapel, MD  oxyCODONE-acetaminophen (PERCOCET/ROXICET) 5-325 MG per tablet Take 1 tablet by mouth every 6 (six) hours as needed for severe pain. 09/11/15   Merryl Hacker, MD    Inpatient medications: .  ceFAZolin (ANCEF) IV  2 g Intravenous to XRAY   Followed by  .  ceFAZolin (ANCEF) IV  1 g Intravenous to XRAY  . enoxaparin (LOVENOX) injection  30 mg Subcutaneous Q24H  . feeding supplement (PRO-STAT SUGAR FREE 64)  30 mL Oral BID  . insulin aspart  0-20 Units Subcutaneous TID WC  . insulin aspart  0-5 Units Subcutaneous QHS  . nystatin   Topical  BID  . ondansetron (ZOFRAN) IV  4 mg Intravenous 4 times per day  . pantoprazole  40 mg Oral Q1200  . sodium chloride  10-40 mL Intracatheter Q12H  . sodium chloride  3 mL Intravenous Q12H    Review of Systems Difficult to obtain as pt on BIPAP   Physical Exam:  BP 146/78 mmHg  Pulse 57  Temp(Src) 98.1 F (36.7 C) (Oral)  Resp 20  Ht _0  (1.651 m)  Wt 430 lb 1.9 oz (195.1 kg)  BMI 71.58 kg/m2  SpO2 100%  Gen: MO AAF. Wearing BIPAP. Smiles, nodes but difficult to understand Skin: Massive elephantiasis with alligator type skin on legs (per sister "like this for 30 years") PICC in right arm, portacath in left chest Neck: Cannot see her neck veins Chest: Ant clear Heart: Regular. Distant heart sounds. O1H08 No S3. Abdomen: Massive pannus with abd wall edema. Cannot assess for organomegaly. Not tender Ext: Massive legs. Elephantiasis.  With assistance able to pull up pannus - located her femoral HD catheter in the right groin. Neuro: Awake, sleepy, on BIPAP   Labs: Basic Metabolic Panel:  Recent Labs Lab 11/23/15 0608 11/24/15 0731 11/25/15 0759 11/26/15 0525 11/27/15 0552 11/28/15 0608  NA 135 136 135 134*  --  134*  K 4.7 4.3 4.4 4.3  --  4.3  CL 97* 100* 99* 98*  --  97*  CO2 _1 --  29  GLUCOSE 110* 129* 135* 200*  --  155*  BUN 54*  59* 66* 70*  --  59*  CREATININE 3.82* 3.91* 3.88* 4.28* 4.58* 4.08*  CALCIUM 8.6* 8.3* 8.5* 8.4*  --  8.7*  PHOS 5.7*  --   --   --   --   --      Recent Labs Lab 11/23/15 0608 11/27/15 0552 11/28/15 0608  WBC 8.1 9.3 8.0  HGB 10.3* 10.4* 10.4*  HCT 31.9* 32.7* 32.8*  MCV 87.4 88.6 89.1  PLT 225 224 213    Recent Labs Lab 11/27/15 0723 11/27/15 1633 11/27/15 2147 11/28/15 0810 11/28/15 1408  GLUCAP 160* 56* 66* 53* 65*     Background 49 y.o. year-old  AAF with a background of MO, chronic lymphedema with massive elephantiasis (moves from bed to chair only), diastolic dysfunction, baseline CKD 3/4 (baseline creatinines available during 2016 appear to have been running 1.6-2.3; felt d/t DM). She was admitted to [facility] on 11/13/15 with RLE cellulitus, worsening edema, and creatinine on 1.62 - developed AKI on CKD with associated hyperkalemia. Was diuretic unresponsive and d/t N/V could not tolerate conservative Rx for hyperkalemia.  It appears she received HD on 11/28, 11/29, 12/2, 12/8 via a temporary right femoral catheter placed 11/28. She was transferred here today ostensibly for placement of TDC in IR and was then to be transferred back to [facility].  She received versed and fentanyl for conscious sedation and shortly afterward had a respiratory arrest. She received Narcan, romazicon and brief chest compressions and was said to have wakened after about 2 minutes.  She is transferred to the ICU for observation. The current plan is to tray again to place catheter in IR without sedation and if stable over next few days transfer back to [facility]. We are asked to follow and to address any emerging dialysis needs.  Assessment/Recommendations  1. AKI on CKD - Oligoanuric.  Presently HD dependent. Has been receiving HD at [facility] since 11/17/15. Still has femoral catheter in place. TDC aborted today  after resp arrest post sedation. Dr. Chase Caller has spoken w/[facility] - they decline to do the catheter in  the OR and have requested that IR place catheter without sedation. Not sure when this will be done.  Today she has no urgent HD needs. Anticipate will need HD tomorrow. Femoral catheter still in place. Anticipate will need HD tomorrow - certainly for volume.  2. LE cellulitis 3. Massive lymphedema with elephantiasis of LE's 4. OHV/hypercarbic resp fx - BIPAP (per notes has frequently declined to wear during [facility] admission results in mild int confusion; ABG 11/29 w/pH 7.1, pCO2 79.7 on 11/29 and not repeated since that time). 5. Chronic diastolic CHF 6. DM 7. HTN 8. MO 9. Anemia - no Fe studies on record. Ordered. 10. CKD-MBD. Last PTH I see is from 2013. Check PTH and phos.   HD orders written for tomorrow - will use fem cath unless [facility] has been placed in the interim.   Jamal Maes,  MD [facility] [phone] pager 11/28/2015, 2:47 PM

## 2015-11-28 NOTE — Progress Notes (Signed)
   11/28/15 2005  Oxygen Therapy/Pulse Ox  O2 Device Nasal Cannula  O2 Therapy Oxygen  O2 Flow Rate (L/min) 2 L/min  SpO2 100 %  Patient requested to be off the Bipap and placed on nasal cannula.

## 2015-11-28 NOTE — Progress Notes (Signed)
Pt. Released to [facility] for transport to [facility] for Dialysis port insertion procedure.

## 2015-11-28 NOTE — Sedation Documentation (Signed)
Procedure cancelled due to respiratory arrest. Patient stabilized with narcan, romazicon, rapid response call. Patient transferred to ICU 69m7 awake, alert, with stable vitals. [facility] given to [facility] - JSoyla Dryer RN

## 2015-11-28 NOTE — Code Documentation (Signed)
CODE BLUE NOTE  Patient Name: [name]   MRN: [id number]   Date of Birth/ Sex: 11-29-1966 , female      Admission Date: 11/28/2015  Attending Provider: Fran Lowes, MD  Primary Diagnosis: Renal failure Cellulitis    Indication: Pt was in her usual state of health until this PM, when she was in IR for attempted [facility] placement and went into respiratory arrest followed by cardiac arrest after receiving fentanyl and versed. Code blue was subsequently called. At the time of arrival on scene, ACLS protocol was underway. She received narcan and romazicon and subsequently regained consciousness.     Technical Description:  - CPR performance duration:  2 cycles  - Was defibrillation or cardioversion used? No   - Was external pacer placed? No  - Was patient intubated pre/post CPR? No, placed on non-rebreather    Medications Administered: Y = Yes; Blank = No Amiodarone  N  Atropine  N  Calcium  N  Epinephrine  N  Lidocaine  N  Magnesium  N  Norepinephrine  N  Phenylephrine  N  Sodium bicarbonate  N  Vasopressin  N    Post CPR evaluation:  - Final Status - Was patient successfully resuscitated ? Yes - What is current rhythm? NSR - What is current hemodynamic status? Stable   Miscellaneous Information:  - Labs sent, including: None  - Primary team notified?  No  - Family Notified? Yes  - Additional notes/ transfer status:  Transferred to CCM for further monitoring       Rogue Bussing, MD  11/28/2015, 10:09 PM

## 2015-11-28 NOTE — Significant Event (Signed)
Rapid Response Event Note  Overview: Rapid Response Team call to IR for unresponsive patient Time Called: [place] Time: 1232 Event Type: Neurologic, Respiratory  Initial Focused Assessment:  IR Staff reports calling RRT for unresponsive patient post sedation for HD tunnelled cathter placement.  On our arrival a Code Blue had been called and chest compressions were in process.  Dr. Annamaria Boots -IR radiologist was present at bedside - patient had been given 2 mg Versed and 100 mcg Fentanyl for sedation - Dr. Annamaria Boots ordered the Romazicon and Narcan for reversal.  BVM per RT.  Post reversal patient became alert and breathing on own.  NRB mask.  Good pulse - see CODE BLUE sheet for details.     Interventions: BVM per RT.  Post reversal patient became alert and breathing on own.  NRB mask.  Good pulse - see CODE BLUE sheet for details.  Transferred to 2M07 with NRB mask. Handoff to [employer].  Family updated by Dr. Annamaria Boots.       Event Summary: Name of Physician Notified: Dr. Annamaria Boots at  (present during procedure)  Name of Consulting Physician Notified: Dr. Chase Caller at 1245  Outcome: Transferred (Comment)  Event End Time: 1330  Quin Hoop

## 2015-11-28 NOTE — H&P (Addendum)
PULMONARY / CRITICAL CARE MEDICINE   Name: [name] MRN: [id number] DOB: 1966/12/01    ADMISSION DATE:  11/13/2015 CONSULTATION DATE:  11/28/15  REFERRING MD:  Dr Larena Glassman  - IR  CHIEF COMPLAINT:  Post sedation respiratory arrest  HISTORY OF PRESENT ILLNESS:  -History from review of the outside chart at [facility] and in discussion with Dr. Noelle Penner of interventional radiology and speaking to the family and patient   49 year old morbildy obese AA female admitted originally 11/13/2015 to [facility]. She has morbid obesity associated with obesity hypoventilation syndrome and chronic lymphedema with elephantiasis leg in association with diastolic dysfunction. It appears that she was originally admitted for cellulitis of the leg associated with a chronic lymphedema. Course of the illness was characterized by frequent refusal to wear BiPAP and also mild intermittent confusion in the morning and grogginess. During this illness she had acute kidney injury superimposed on chronic. She underwent a right femoral dialysis catheter placement in the hospital and had undergone intermittent hemodialysis. First dialysis was 11/21/2015 with repeat dialysis on 11/27/2015. On 11/28/2015 she came as an transferred to interventional radiology at [facility] for placement of a tunneled permacath catheter placement. She received 2 mg Versed and 100 g fentanyl for conscious sedation after which she had a respiratory arrest. This was followed by cardiac arrest and the brief CPR with return of spontaneous circulation, normal blood pressure and normal mental status. She was then transferred on facemask oxygen to the medical intensive care unit at [facility]. Critical care medicine at [facility] is now admitting the patient 11/28/2015. At this point in time she is awake although a bit groggy and fully oriented with normal vital signs and rapidly weaning off  oxygen.  EVENTS 11/13/2015 - admit to [facility] cellultis 11/21/15 - rt groin HD cath and first HD 11/26/15 or 12/8./16- 2nd HD at [facility]. Ongoing intermittent delirium include noncompliance with BiPAP noted at [facility] 11/28/15 - respiratory followed by cardiac arrest following conscious sedation drugs at [facility] IR suite. Return of spontaneous circulation, normal blood pressure and mental status following CPR  PAST MEDICAL HISTORY :  She  has a past medical history of Diabetes mellitus; Chronic pain; Lymph edema; Cellulitis; Chiari malformation; S/P colonoscopy (05/26/2007); CAD (coronary artery disease); Hypercholesterolemia; Hypertension; COPD (chronic obstructive pulmonary disease) ([place]); S/P endoscopy (05/01/2009); Fibromyalgia; On home O2; Obesity hypoventilation syndrome ([place]); and echocardiogram (10/2011).  PAST SURGICAL HISTORY: She  has past surgical history that includes Cesarean section; Abdominal hysterectomy; Tonsillectomy; Adenoidectomy; Thyroidectomy, partial; Craniectomy suboccipital w/ cervical laminectomy / Chiari; and Portacath placement (07/05/2012).  Allergies  Allergen Reactions  . Vancomycin Nausea And Vomiting    Infusion "made me feel like I was dying" had to be readmitted to hospital  . Contrast Media [Iodinated Diagnostic Agents] Hives and Swelling    Dye for cardiac cath. Tongue swells  . Pineapple Itching and Swelling  . Pneumococcal Vaccines Swelling    Turns skin black, and swelling  . Shellfish Allergy Itching and Swelling    No current facility-administered medications on file prior to encounter.   Current Outpatient Prescriptions on File Prior to Encounter  Medication Sig  . ALPRAZolam (XANAX) 0.5 MG tablet Take 0.5 mg by mouth 3 (three) times daily as needed for anxiety or sleep.   Marland Kitchen amLODipine (NORVASC) 10 MG tablet Take 10 mg by mouth daily.  . insulin NPH-insulin regular (NOVOLIN 70/30) (70-30) 100 UNIT/ML  injection Inject 60 Units  into the skin 2 (two) times daily.  . metoprolol (LOPRESSOR) 50 MG tablet Take 50 mg by mouth 2 (two) times daily.  . Oxycodone HCl 10 MG TABS Take 10 mg by mouth 4 (four) times daily as needed (pain).   . potassium chloride SA (K-DUR,KLOR-CON) 20 MEQ tablet Take 20 mEq by mouth 3 (three) times daily.  Marland Kitchen torsemide (DEMADEX) 100 MG tablet Take 100 mg by mouth 2 (two) times daily.   Marland Kitchen acetaminophen (TYLENOL) 325 MG tablet Take 650 mg by mouth every 6 (six) hours as needed. fever  . diphenhydrAMINE (BENADRYL) 25 mg capsule Take 25 mg by mouth every 6 (six) hours as needed for allergies.  Marland Kitchen docusate sodium (COLACE) 100 MG capsule Take 100 mg by mouth 2 (two) times daily as needed. For constipation  . HYDROcodone-acetaminophen (NORCO/VICODIN) 5-325 MG per tablet Take 2 tablets by mouth every 4 (four) hours as needed. (Patient not taking: Reported on 12/02/2014)  . oseltamivir (TAMIFLU) 75 MG capsule Take 1 capsule (75 mg total) by mouth every 12 (twelve) hours. (Patient not taking: Reported on 12/02/2014)  . oxyCODONE-acetaminophen (PERCOCET/ROXICET) 5-325 MG per tablet Take 1 tablet by mouth every 6 (six) hours as needed for severe pain.  . [DISCONTINUED] insulin lispro (HUMALOG) 100 UNIT/ML injection Inject 60 Units into the skin 2 (two) times daily.      FAMILY HISTORY:  Her indicated that her mother is deceased. She indicated that her father is alive. She indicated that her sister is alive. She indicated that only one of her two brothers is alive.   SOCIAL HISTORY: She  reports that she has never smoked. She has never used smokeless tobacco. She reports that she does not drink alcohol or use illicit drugs.  REVIEW OF SYSTEMS:   According to history of present illness   VITAL SIGNS: BP 143/85 mmHg  Pulse 56  Temp(Src) 97.7 F (36.5 C) (Oral)  Resp 8  Ht _0  (1.651 m)  Wt 195.1 kg (430 lb 1.9 oz)  BMI 71.58 kg/m2  SpO2 100%  HEMODYNAMICS:    VENTILATOR SETTINGS:    INTAKE /  OUTPUT: I/O last 3 completed shifts: In: 2635.5 [P.O.:720; I.V.:1775.5; IV Piggyback:140] Out: 8315 [Urine:250; Other:3500]  PHYSICAL EXAMINATION: General:  Morbidly obese female. Looks chronically unwell Neuro:  Alert and oriented 3. Is able to give history. Seems to recognize family members and seems to have intact cognitive capacity. Slow to answer and a bit groggy  HEENT:  Mallampati class IV Cardiovascular:  Normal blood pressure and heart rate. Normal heart sounds Lungs:  Clear to auscultation bilaterally but given obesity difficult to auscultate and hear clearly Abdomen:  Significant visible obesity present soft. Musculoskeletal:  Chronic elephantiasis of bilaterally Skin:  Elephantiasis of the skin bilaterally lower extremities  LABS: PULMONARY No results for input(s): PHART, PCO2ART, PO2ART, HCO3, TCO2, O2SAT in the last 168 hours.  Invalid input(s): PCO2, PO2  CBC  Recent Labs Lab 11/23/15 0608 11/27/15 0552 11/28/15 0608  HGB 10.3* 10.4* 10.4*  HCT 31.9* 32.7* 32.8*  WBC 8.1 9.3 8.0  PLT 225 224 213    COAGULATION  Recent Labs Lab 11/27/15 1314  INR 1.28    CARDIAC  No results for input(s): TROPONINI in the last 168 hours. No results for input(s): PROBNP in the last 168 hours.   CHEMISTRY  Recent Labs Lab 11/23/15 1761 11/24/15 0731 11/25/15 0759 11/26/15 0525 11/27/15 0552 11/28/15 0608  NA 135 136 135 134*  --  134*  K 4.7 4.3 4.4 4.3  --  4.3  CL 97* 100* 99* 98*  --  97*  CO2 _0 --  29  GLUCOSE 110* 129* 135* 200*  --  155*  BUN 54* 59* 66* 70*  --  59*  CREATININE 3.82* 3.91* 3.88* 4.28* 4.58* 4.08*  CALCIUM 8.6* 8.3* 8.5* 8.4*  --  8.7*  PHOS 5.7*  --   --   --   --   --    Estimated Creatinine Clearance: 29.5 mL/min (by C-G formula based on Cr of 4.08).   LIVER  Recent Labs Lab 11/27/15 1314  INR 1.28     INFECTIOUS No results for input(s): LATICACIDVEN, PROCALCITON in the last 168 hours.   ENDOCRINE CBG  (last 3)   Recent Labs  11/27/15 1633 11/27/15 2147 11/28/15 0810  GLUCAP 158* 136* 112*         IMAGING x48h  - image(s) personally visualized  -   highlighted in bold No results found.  MICRO    DISCUSSION: Morbidly obese female with acute on chronic renal failure based on sleep apnea noncompliant with BiPAP. Prolonged hospitalization at [facility] for cellulitis and acute on chronic kidney failure. Status post respiratory arrest following conscious sedation but with return to sporting circulation, blood pressure and mental status. In medical ICU at [facility] for observation 11/28/2015. Need to sort out mechanism for permacath placement  ASSESSMENT / PLAN:  PULMONARY A: Respiratory followed by cardiac arrest following conscious sedation drugs. Return as spontaneous circulation, blood pressure and mental status. At this point appears a little bit groggy but maintaining saturations   P:   BiPAP continuous but definitely daily at bedtime  CARDIOVASCULAR A:  Cardiac arrest following respiratory arrest but with return of spontaneous circulation blood pressure P:  Monitor Hold lopressors scheduled Serial trop  RENAL A:   Acute on chronic renal failure. Status post right groin HD cath and dialysis 11/21/2015 and also December 7 or eighth 2016. In need of permacath  P:   Renal consult Dr. Lorrene Reid will be called Dc diuresis (l;asix and metalazone) an HD orders from [facility] can do fresh [facility] orders Any permacath placement  - d/w  Dr Reesa Chew of IR - they will re-eval 11/29/15 for lidocaine based placement ([facility] feels migth be risky for gA)  GASTROINTESTINAL A:   In need of renal, modified diet. On  3 anti0emetics for nausea including reglan P:   Nothing by mouth for 24 hours post respiratory arrest Dc reglan due to renal dz Ok for zofran and phenergan for the moment proptonix  HEMATOLOGIC A:   Anemia of chronic disease and critical illness P:   PRBC for hemoglobin less than 7 g percent lovenox dvt proph   INFECTIOUS A:   Admitted 11/13/2015 for cellulitis of the lower extremity P:   Continue antibiotics that were being administered at [facility] - cefazolin 128/16 [place] 11/24 >> 12/5    ENDOCRINE A:   At risk for hyperglycemia   P:   Sliding scale insulin  NEUROLOGIC A:   Appears to be on chronic pain meds and benzodiazepine Post CPR appears neurologically intact but a little bit groggy P:   RASS goal: 0 Monitor with BiPAP Hold all baseline Xanax and oxycodone   FAMILY  - Updates: Dr. Scarlette Ar updated over the phone from the bedside in the presence of nurse. Patient's sister also updated at the bedside   -  Inter-disciplinary family meet or Palliative Care meeting due by:  Seventh day which is 12/05/2015. Initial interdisciplinary meeting was held in the presence of nurse Mendel Ryder with the patient and family members. Explain current illness. Expectation is full code. Anticipate slow improvement with BiPAP. After that with the help of renal consult figure out mechanism to place permacath under anesthesia. This would all have to be done possibly at [facility]. Expected discharge is several days from now       The patient is critically ill with multiple organ systems failure and requires high complexity decision making for assessment and support, frequent evaluation and titration of therapies, application of advanced monitoring technologies and extensive interpretation of multiple databases.   Critical Care Time devoted to patient care services described in this note is  45  Minutes. This time reflects time of care of this signee Dr Brand Males. This critical care time does not reflect procedure time, or teaching time or supervisory time of PA/NP/Med student/Med Resident etc but could involve care discussion time    Dr. Brand Males, M.D., [facility].C.P Pulmonary and Critical Care Medicine Staff Physician [place] [facility] Pager: [phone], If no answer or between  15:00h - 7:00h: call [phone]  11/28/2015 1:48 PM

## 2015-11-28 NOTE — Evaluation (Signed)
Physical Therapy Evaluation Patient Details Name: [name] MRN: [id number] DOB: 24-Oct-1966 Today's Date: 11/28/2015   History of Present Illness  Pt is a 49 year old morbidly obese female who was admitted due to LE cellulitis.  She has been bedbound since her admission on 11-13-15.  Prior to that she was able to transfer bed to [facility], sometimes needing assist.  She lives with her husband who mobilizes in a w/c.  She has not walked in at least a year.  Clinical Impression   Pt was seen at bedside for evaluation.  Her husband was present.  She was alert and cooperative but did appear slightly confused at times.  She has developed significant weakness throughout and is now totally bedbound.  There is decreased LE joint motion and strength is generally 2/5 to 2+/5.  Because she had been able to transfer  Bed to chair within the past month, she should have the potential to regain this ability.  For this reason, I am recommending SNF at d/c.  Pt would be agreeable to this.  We will have to initially use a lift for transferring her bed to chair.  She should be able to tolerate sitting with no difficulty.    Follow Up Recommendations SNF    Equipment Recommendations   (will need a Hoyer lift if discharging home)    Recommendations for Other Services   OT    Precautions / Restrictions Precautions Precautions: Fall Restrictions Weight Bearing Restrictions: No      Mobility  Bed Mobility               General bed mobility comments: unable to mobilize in the bed due to her girth  Transfers                 General transfer comment: will need a full lift to get bed to chair  Ambulation/Gait             General Gait Details: not Architect    Modified Rankin (Stroke Patients Only)       Balance Overall balance assessment:  (unable to test)                                           Pertinent  Vitals/Pain Pain Assessment: No/denies pain (at rest...has soreness in LE joints due to stiffness)    Home Living Family/patient expects to be discharged to:: Skilled nursing facility Living Arrangements: Spouse/significant other               Additional Comments: son lives with them as well    Prior Function Level of Independence: Needs assistance   Gait / Transfers Assistance Needed: transferred bed to [facility] with min to no assist...not ambulatory  ADL's / Homemaking Assistance Needed: assist with bathing and dressing, all household activities        Hand Dominance   Dominant Hand: Right    Extremity/Trunk Assessment   Upper Extremity Assessment: Generalized weakness           Lower Extremity Assessment: Generalized weakness;RLE deficits/detail;LLE deficits/detail RLE Deficits / Details: severe lymphedema which limits ROM of all LE joints...is able to achieve about5 40* knee flexion...strength generally 2/5 LLE Deficits / Details: lymphedema not as severe as in LLE with slightly more ROM in all  joints...strength 2+/5     Communication   Communication: No difficulties  Cognition Arousal/Alertness: Awake/alert Behavior During Therapy: WFL for tasks assessed/performed Overall Cognitive Status: Within Functional Limits for tasks assessed                      General Comments      Exercises General Exercises - Upper Extremity Shoulder Flexion: AROM;AAROM;Both;10 reps;Supine General Exercises - Lower Extremity Ankle Circles/Pumps: AROM;Both;20 reps;Supine Short Arc Quad: AAROM;Both;10 reps;Supine      Assessment/Plan    PT Assessment Patient needs continued PT services  PT Diagnosis Generalized weakness   PT Problem List    PT Treatment Interventions     PT Goals (Current goals can be found in the Care Plan section) Acute Rehab PT Goals Patient Stated Goal: none stated PT Goal Formulation: With patient/family Time For Goal Achievement:  12/12/15 Potential to Achieve Goals: Fair Additional Goals Additional Goal #1: will tolerate sitting in a chair for 1 hour    Frequency     Barriers to discharge        Co-evaluation               End of Session Equipment Utilized During Treatment: Oxygen Activity Tolerance: Patient tolerated treatment well Patient left: in bed;with call bell/phone within reach Nurse Communication: Mobility status;Need for lift equipment         Time: [phone] PT Time Calculation (min) (ACUTE ONLY): 35 min   Charges:   PT Evaluation $Initial PT Evaluation Tier I: 1 Procedure PT Treatments $Therapeutic Exercise: 8-22 mins   PT G CodesSable Feil  PT 11/28/2015, 10:33 AM [phone]

## 2015-11-29 ENCOUNTER — Inpatient Hospital Stay (HOSPITAL_COMMUNITY): Payer: Medicaid Other

## 2015-11-29 DIAGNOSIS — E662 Morbid (severe) obesity with alveolar hypoventilation: Secondary | ICD-10-CM

## 2015-11-29 DIAGNOSIS — I469 Cardiac arrest, cause unspecified: Secondary | ICD-10-CM

## 2015-11-29 DIAGNOSIS — E43 Unspecified severe protein-calorie malnutrition: Secondary | ICD-10-CM | POA: Insufficient documentation

## 2015-11-29 LAB — RENAL FUNCTION PANEL
ALBUMIN: 2.4 g/dL — AB (ref 3.5–5.0)
ANION GAP: 8 (ref 5–15)
BUN: 61 mg/dL — ABNORMAL HIGH (ref 6–20)
CALCIUM: 8.7 mg/dL — AB (ref 8.9–10.3)
CO2: 29 mmol/L (ref 22–32)
CREATININE: 4.39 mg/dL — AB (ref 0.44–1.00)
Chloride: 99 mmol/L — ABNORMAL LOW (ref 101–111)
GFR, EST AFRICAN AMERICAN: 13 mL/min — AB (ref >60)
GFR, EST NON AFRICAN AMERICAN: 11 mL/min — AB (ref >60)
Glucose, Bld: 122 mg/dL — ABNORMAL HIGH (ref 65–99)
PHOSPHORUS: 5.1 mg/dL — AB (ref 2.5–4.6)
Potassium: 4.4 mmol/L (ref 3.5–5.1)
SODIUM: 136 mmol/L (ref 135–145)

## 2015-11-29 LAB — HEPATIC FUNCTION PANEL
ALBUMIN: 2.3 g/dL — AB (ref 3.5–5.0)
ALT: 11 U/L — AB (ref 14–54)
AST: 11 U/L — AB (ref 15–41)
Alkaline Phosphatase: 104 U/L (ref 38–126)
Bilirubin, Direct: 0.5 mg/dL (ref 0.1–0.5)
Indirect Bilirubin: 0.8 mg/dL (ref 0.3–0.9)
TOTAL PROTEIN: 6.4 g/dL — AB (ref 6.5–8.1)
Total Bilirubin: 1.3 mg/dL — ABNORMAL HIGH (ref 0.3–1.2)

## 2015-11-29 LAB — CBC
HCT: 30 % — ABNORMAL LOW (ref 36.0–46.0)
HEMOGLOBIN: 9.5 g/dL — AB (ref 12.0–15.0)
MCH: 27.5 pg (ref 26.0–34.0)
MCHC: 31.7 g/dL (ref 30.0–36.0)
MCV: 87 fL (ref 78.0–100.0)
PLATELETS: 214 10*3/uL (ref 150–400)
RBC: 3.45 MIL/uL — AB (ref 3.87–5.11)
RDW: 17.8 % — ABNORMAL HIGH (ref 11.5–15.5)
WBC: 6.7 10*3/uL (ref 4.0–10.5)

## 2015-11-29 LAB — IRON AND TIBC
IRON: 40 ug/dL (ref 28–170)
Saturation Ratios: 14 % (ref 10.4–31.8)
TIBC: 277 ug/dL (ref 250–450)
UIBC: 237 ug/dL

## 2015-11-29 LAB — GLUCOSE, CAPILLARY
Glucose-Capillary: 105 mg/dL — ABNORMAL HIGH (ref 65–99)
Glucose-Capillary: 116 mg/dL — ABNORMAL HIGH (ref 65–99)
Glucose-Capillary: 140 mg/dL — ABNORMAL HIGH (ref 65–99)

## 2015-11-29 LAB — PROTIME-INR
INR: 1.37 (ref 0.00–1.49)
Prothrombin Time: 17 seconds — ABNORMAL HIGH (ref 11.6–15.2)

## 2015-11-29 LAB — TROPONIN I: Troponin I: <0.03 ng/mL (ref <0.031)

## 2015-11-29 MED ORDER — HEPARIN SODIUM (PORCINE) 1000 UNIT/ML IJ SOLN
INTRAMUSCULAR | Status: AC
  Filled 2015-11-29: qty 1

## 2015-11-29 MED ORDER — LIDOCAINE HCL (PF) 1 % IJ SOLN
INTRAMUSCULAR | Status: AC
  Filled 2015-11-29: qty 30

## 2015-11-29 MED ORDER — SODIUM CHLORIDE 0.9 % IV SOLN: 100.0000 mL | INTRAVENOUS | Status: DC | PRN

## 2015-11-29 MED ORDER — OXYCODONE HCL 5 MG PO TABS
10.0000 mg | ORAL_TABLET | Freq: Four times a day (QID) | ORAL | Status: DC | PRN
  Administered 2015-11-29 – 2015-12-04 (×6): 10 mg via ORAL
  Filled 2015-11-29 (×6): qty 2

## 2015-11-29 MED ORDER — METOPROLOL TARTRATE 25 MG PO TABS
25.0000 mg | ORAL_TABLET | Freq: Two times a day (BID) | ORAL | Status: DC
  Administered 2015-11-30 – 2015-12-05 (×9): 25 mg via ORAL
  Filled 2015-11-29 (×13): qty 1

## 2015-11-29 MED ORDER — CEFAZOLIN SODIUM-DEXTROSE 2-3 GM-% IV SOLR
INTRAVENOUS | Status: AC
  Administered 2015-11-29: 2000 mg via INTRAVENOUS
  Filled 2015-11-29: qty 50

## 2015-11-29 MED ORDER — EPINEPHRINE HCL 0.1 MG/ML IJ SOSY
PREFILLED_SYRINGE | INTRAMUSCULAR | Status: AC
  Filled 2015-11-29: qty 10

## 2015-11-29 MED ORDER — GELATIN ABSORBABLE 12-7 MM EX MISC
CUTANEOUS | Status: AC
  Filled 2015-11-29: qty 1

## 2015-11-29 MED ORDER — NEPRO/CARBSTEADY PO LIQD
237.0000 mL | ORAL | Status: DC
  Administered 2015-11-29 – 2015-11-30 (×2): 237 mL via ORAL
  Filled 2015-11-29 (×5): qty 237

## 2015-11-29 MED ORDER — ATROPINE SULFATE 0.1 MG/ML IJ SOLN
INTRAMUSCULAR | Status: AC
  Filled 2015-11-29: qty 10

## 2015-11-29 MED ORDER — SODIUM CHLORIDE 0.9 % IV SOLN
125.0000 mg | Freq: Once | INTRAVENOUS | Status: DC
Start: 2015-11-29 — End: 2015-12-05
  Filled 2015-11-29: qty 10

## 2015-11-29 NOTE — Progress Notes (Signed)
[facility] PCCM  Case discussed with Dr. [name] this morning, he is willing to accept her in transfer this afternoon after HD is completed.  She desires to go back to [facility].  Will discuss with care link.  Roselie Awkward, MD [facility] PCCM Pager: [phone] Cell: [phone] After 3pm or if no response, call [phone]

## 2015-11-29 NOTE — Progress Notes (Signed)
PULMONARY / CRITICAL CARE MEDICINE   Name: [name] MRN: [id number] DOB: February 25, 1966    ADMISSION DATE:  11/13/2015 CONSULTATION DATE:  11/28/15  REFERRING MD:  Dr Larena Glassman  - IR  CHIEF COMPLAINT:  Post sedation respiratory arrest  Brief: Admitted to [facility] on 11/24 with leg cellulitis, hypoxemia due to CHF and obesity hypoventilation syndrome.  Developed AKI, had a cardiac arrest with perm cath placement after conscious sedation on 12/9, so transferred to [facility].  EVENTS 11/13/2015 - admit to [facility] cellultis 11/21/15 - rt groin HD cath and first HD 11/26/15 or 12/8./16- 2nd HD at [facility]. Ongoing intermittent delirium include noncompliance with BiPAP noted at [facility] 11/28/15 - respiratory followed by cardiac arrest following conscious sedation drugs at [facility] IR suite. Return of spontaneous circulation, normal blood pressure and mental status following CPR  SUBJECTIVE: No acute events since admission to our ICU after cardiac arrest yesterday, slept with BIPAP  VITAL SIGNS: BP 117/58 mmHg  Pulse 48  Temp(Src) 97.8 F (36.6 C) (Oral)  Resp 19  Ht _0  (1.651 m)  Wt 200.943 kg (443 lb)  BMI 73.72 kg/m2  SpO2 100%  HEMODYNAMICS:    VENTILATOR SETTINGS: Vent Mode:  [-]  FiO2 (%):  [35 %-40 %] 35 %  INTAKE / OUTPUT: I/O last 3 completed shifts: In: 23 [I.V.:23] Out: 1000 [Urine:1000]  PHYSICAL EXAMINATION:  Gen: morbid obesity, no distress HEENT: NCAT, OP clear PULM: CTA B normal effort CV: RRR, no mgr GI: BS+, soft, nontender MSK: normal bulk, tone Derm: chronic edema Neuro: A&Ox4, maew   LABS: PULMONARY No results for input(s): PHART, PCO2ART, PO2ART, HCO3, TCO2, O2SAT in the last 168 hours.  Invalid input(s): PCO2, PO2  CBC  Recent Labs Lab 11/28/15 0608 11/28/15 1530 11/29/15 0410  HGB 10.4* 10.3* 9.5*  HCT 32.8* 33.2* 30.0*  WBC 8.0 9.0 6.7  PLT 213 212 214    COAGULATION  Recent Labs Lab 11/27/15 1314  11/29/15 0410  INR 1.28 1.37    CARDIAC    Recent Labs Lab 11/28/15 1530 11/28/15 2017 11/29/15 0410  TROPONINI <0.03 <0.03 <0.03   No results for input(s): PROBNP in the last 168 hours.   CHEMISTRY  Recent Labs Lab 11/23/15 [phone]  11/25/15 0759 11/26/15 0525 11/27/15 0552 11/28/15 0608 11/28/15 1530 11/29/15 0410  NA 135  < > 135 134*  --  134* 135 136  K 4.7  < > 4.4 4.3  --  4.3 4.3 4.4  CL 97*  < > 99* 98*  --  97* 98* 99*  CO2 26  < > 29 30  --  _1 GLUCOSE 110*  < > 135* 200*  --  155* 180* 122*  BUN 54*  < > 66* 70*  --  59* 55* 61*  CREATININE 3.82*  < > 3.88* 4.28* 4.58* 4.08* 4.28* 4.39*  CALCIUM 8.6*  < > 8.5* 8.4*  --  8.7* 8.7* 8.7*  PHOS 5.7*  --   --   --   --   --   --  5.1*  < > = values in this interval not displayed. Estimated Creatinine Clearance: 28 mL/min (by C-G formula based on Cr of 4.39).   LIVER  Recent Labs Lab 11/27/15 1314 11/29/15 0410  AST  --  11*  ALT  --  11*  ALKPHOS  --  104  BILITOT  --  1.3*  PROT  --  6.4*  ALBUMIN  --  2.3*  2.4*  INR 1.28 1.37     INFECTIOUS  Recent Labs Lab 11/28/15 1530  LATICACIDVEN 0.8     ENDOCRINE CBG (last 3)   Recent Labs  11/28/15 1707 11/28/15 2157 11/29/15 0800  GLUCAP 140* 123* 105*         IMAGING x48h  - image(s) personally visualized  -   highlighted in bold No results found.  MICRO    DISCUSSION: Morbidly obese female with acute on chronic renal failure based on sleep apnea noncompliant with BiPAP. Prolonged hospitalization at [facility] for cellulitis and acute on chronic kidney failure. Status post respiratory arrest following conscious sedation but with return to sporting circulation, blood pressure and mental status. Now doing well with out evidence of neurologic injury 12/10.  Plan HD with local anesthesia, HD, then transfer back to [facility] today.  ASSESSMENT / PLAN:  PULMONARY A: Respiratory followed by cardiac arrest > resolved Obesity  hypoventilation syndrome  P:   BiPAP qHS No conscious sedation for perm cath today  CARDIOVASCULAR A:  Cardiac arrest following respiratory arrest but with return of spontaneous circulation blood pressure P:  Tele Restart lopressor after HD cath today   RENAL A:   Acute on chronic renal failure. Status post right groin HD cath and dialysis 11/21/2015 and also December 7 or eighth 2016.  P:   HD today after permcath   GASTROINTESTINAL A:   In need of renal, modified diet. On  3 anti-emetics for nausea including reglan P:   Advance diet after permcath Ok for zofran and phenergan for the moment proptonix  HEMATOLOGIC A:   Anemia of chronic disease and critical illness P:  PRBC for hemoglobin less than 7 g percent lovenox dvt proph   INFECTIOUS A:   Admitted 11/13/2015 for cellulitis of the lower extremity P:   Continue antibiotics that were being administered at [facility] - cefazolin 128/16 [place] 11/24 >> 12/5    ENDOCRINE A:   At risk for hyperglycemia   P:   Sliding scale insulin  NEUROLOGIC A:   Chronic pain meds and benzodiazepine P:   RASS goal: 0 Monitor with BiPAP Hold all baseline Xanax and oxycodone   FAMILY  - Updates: Husband updated bedside  Roselie Awkward, MD [facility] [facility] Pager: [phone] Cell: [phone] After 3pm or if no response, call [phone]  11/29/2015 9:48 AM

## 2015-11-29 NOTE — Sedation Documentation (Signed)
Patient denies pain and is resting comfortably.

## 2015-11-29 NOTE — Progress Notes (Addendum)
Nutrition Follow-up  DOCUMENTATION CODES:  Morbid obesity, Severe malnutrition in context of acute illness/injury   Pt meets criteria for SEVERE MALNUTRITION in the context of ACUTE ILLNESS as evidenced by estimated energy intake of < or equal to 50% of needs for > or equal to 5 days and severe fluid accumulation.  INTERVENTION:  Magic cup BID s, each supplement provides 290 kcal and 9 grams of protein  30 ml Prostat BID, each 30 mls provides 100 kcal and 15 grams of Protein.   Nepro Shake po q 24 hours, each supplement provides 425 kcal and 19 grams protein  Pt continues struggling with oral intake. Has not been able to meet >50% of estimated needs for all, but 1 day of her admission.  If patient cannot meet >50% of estimated needs in immediate future, Enteral nutrition is warranted  NUTRITION DIAGNOSIS:  Inadequate oral intake related to poor appetite as evidenced by very minimal nutrition x 16 days  Ongoing, worsening  GOAL:  Patient will meet greater than or equal to 90% of their needs  MONITOR:  PO intake, Supplement acceptance, Labs, Weight trends   ASSESSMENT:  49 y/o female PMH DM2, CAS, HTN, Hypercholesterolemia,  BLE lymphedema, grade 2 diastolic dysfunction, morbid obesity presented to ED with h/o progressive BLE edema, n,v, poor PO intake, SOB, decreased UOP, pain and weeping. Initial evaluation was concerning for early cellulitis. During admission devleoped  Acute respiratory failure (subsequent transfer to ICU) and AKI on CKD3 s/p dialysis 11/28 and 11/29  Interval Hx 11/30-12/1: Patient still w/ nausea still. Still no intake. Weighed yesterday as 363 lbs?  Interval Hx: 12/1-12/6: Had dialysis again on 12/2. Oral intake has finally begun, but is still very limited due to ongoing N/V/fatigue/confusion. The only day she ate somewhat well was 12/5. AKI appears to be biggest issue at the moment.   Interval Hx 12/6-12/10: Dialysis again 12/8. Determined pt will need  prolonged dialysis. Transferred [facility] placement. Coded after versed/fentyl administration. Admitted to [facility].  Pt remains NPO at this time as she is going for another attempt at [facility] placement was unable to get her catheter placed yesterday.   She again has gone days without eating. Last meal appears to be 12/8. She did begin to consistently eat 12/5-12/8, but still appears <50% needs. Per pt report today, she states she thinks she has had "a half meal in total" throughout her 16 day admission. She does remember taking prostat a few times.    She went her first 7 days with no intake and has further struggled with ongoing n/v, loss of appetite, and NPO statuses throughout admission.   As of right now she states "I am starving". This is very good to hear. However, as of now she still remains appropriate for enteral nutrition as she has had very minimal oral intake throughout her admission.   She was agreeable to trying wide range of oral supplements.   Diet Order:  Diet NPO time specified Except for: Sips with Meds  Skin:  Cellulitis BLE  Last BM:  12/6  Height:  Ht Readings from Last 1 Encounters:  11/13/15 _0  (1.651 m)   Weight:  Wt Readings from Last 1 Encounters:  11/29/15 443 lb (200.943 kg)    Wt Readings from Last 10 Encounters:  11/29/15 443 lb (200.943 kg)  09/11/15 350 lb (158.759 kg)  12/02/14 350 lb (158.759 kg)  11/06/14 340 lb (154.223 kg)  08/11/13 330 lb (149.687 kg)  05/30/13 342  lb (155.13 kg)  04/04/13 370 lb (167.831 kg)  09/05/12 388 lb 0.2 oz (176 kg)  07/08/12 375 lb 10.6 oz (170.4 kg)  03/08/12 358 lb 14.5 oz (162.8 kg)   Ideal Body Weight:  56.82 kg  BMI:  Body mass index is 73.72 kg/(m^2).  SBW for age/ht: 63 kg  Estimated Nutritional Needs:  Kcal:  1900-2200 (30-35 kcal/kg SBW) Protein:  88-101 g (1.4-1.6 g/kg sbw) Fluid:  Per MD  EDUCATION NEEDS:  No education needs identified at this time  Burtis Junes RD, LDN Nutrition Pager:  [phone] 11/29/2015 12:31 PM

## 2015-11-29 NOTE — Progress Notes (Signed)
Patient taken off of Bipap and placed on 4L nasal cannula.  Patient sats currently 95% and is tolerating well.  Informed patient to call RT if she felt as though she needed to go back on Bipap.  Will continue to monitor.

## 2015-11-29 NOTE — Sedation Documentation (Signed)
Pt doing well, tolerating procedure with lidocaine at site. A/O x 4 denies any pain/discomfort/SOB at this time.

## 2015-11-29 NOTE — Procedures (Signed)
Successful RT IJ HD CATH INSERTION NO COMP STABLE TIP SVC/RA READY FOR USE FULL REPORT IN PACS

## 2015-11-29 NOTE — Progress Notes (Signed)
[facility] Rounding Note  Subjective: Feeling fine this AM Talking on the phone and wondering when [facility] to be placed (I just learned will be done this AM) No CP or SOB Off BIPAP right now and wearing nasal cannula  Vital signs in last 24 hours: Filed Vitals:   11/29/15 0534 11/29/15 0600 11/29/15 0737 11/29/15 0757  BP:  103/57 116/56   Pulse: 60 64 69   Temp:    97.8 F (36.6 C)  TempSrc:    Oral  Resp: _0 Height:      Weight:      SpO2: 96% 93%     Weight change: -7.258 kg (-16 lb)  Intake/Output Summary (Last 24 hours) at 11/29/15 0842 Last data filed at 11/29/15 0600  Gross per 24 hour  Intake     10 ml  Output   1000 ml  Net   -990 ml    Physical Exam:  Blood pressure 116/56, pulse 69, temperature 97.8 F (36.6 C), temperature source Oral, resp. rate 14, height _1  (1.651 m), weight 200.943 kg (443 lb), SpO2 93 %. Gen: MO AAF. Wearing nasal cannula. Wide awake, alert and appropriate - very pleasant Skin: Massive elephantiasis with alligator type skin on legs  PICC in right arm, portacath in left chest Neck: Cannot see neck veins Chest: Ant clear Heart: Regular. Distant heart sounds. [id number] No S3. Abdomen: Massive pannus with abd wall edema. Not tender Ext: Massive legs. Elephantiasis.  With assistance yesterday able to pull up pannus - located her femoral HD catheter in the right groin  (did not examine that today). Neuro: Awake, alert, oriented and on [place]  Labs:   Recent Labs Lab 11/23/15 0608 11/24/15 0731 11/25/15 0759 11/26/15 0525 11/27/15 0552 11/28/15 0608 11/28/15 1530 11/29/15 0410  NA 135 136 135 134*  --  134* 135 136  K 4.7 4.3 4.4 4.3  --  4.3 4.3 4.4  CL 97* 100* 99* 98*  --  97* 98* 99*  CO2 _2 --  _3 GLUCOSE 110* 129* 135* 200*  --  155* 180* 122*  BUN 54* 59* 66* 70*  --  59* 55* 61*  CREATININE 3.82* 3.91* 3.88* 4.28* 4.58* 4.08* 4.28* 4.39*  CALCIUM 8.6* 8.3* 8.5* 8.4*  --  8.7* 8.7* 8.7*   PHOS 5.7*  --   --   --   --   --   --  5.1*     Recent Labs Lab 11/29/15 0410  AST 11*  ALT 11*  ALKPHOS 104  BILITOT 1.3*  PROT 6.4*  ALBUMIN 2.3*  2.4*    Recent Labs Lab 11/27/15 0552 11/28/15 0608 11/28/15 1530 11/29/15 0410  WBC 9.3 8.0 9.0 6.7  HGB 10.4* 10.4* 10.3* 9.5*  HCT 32.7* 32.8* 33.2* 30.0*  MCV 88.6 89.1 88.5 87.0  PLT 224 213 212 214     Recent Labs Lab 11/28/15 1530 11/28/15 2017 11/29/15 0410  TROPONINI <0.03 <0.03 <0.03     Recent Labs Lab 11/28/15 0810 11/28/15 1408 11/28/15 1707 11/28/15 2157 11/29/15 0800  GLUCAP 112* 136* 140* 123* 105*     Results for Lindsay, [name] (MRN [id number]) as of 11/29/2015 08:50  Ref. Range 11/29/2015 04:10  Iron Latest Ref Range: 28-170 ug/dL 40  UIBC Latest Units: ug/dL 237  TIBC Latest Ref Range: 250-450 ug/dL 277  Saturation Ratios Latest Ref Range: 10.4-31.8 % 14     Medications .  ceFAZolin (ANCEF) IV  2 g Intravenous to XRAY   Followed by  .  ceFAZolin (ANCEF) IV  1 g Intravenous to XRAY  . feeding supplement (PRO-STAT SUGAR FREE 64)  30 mL Oral BID  . insulin aspart  0-20 Units Subcutaneous TID WC  . insulin aspart  0-5 Units Subcutaneous QHS  . nystatin   Topical BID  . ondansetron (ZOFRAN) IV  4 mg Intravenous 4 times per day  . pantoprazole  40 mg Oral Q1200  . sodium chloride  10-40 mL Intracatheter Q12H  . sodium chloride  10-40 mL Intracatheter Q12H  . sodium chloride  3 mL Intravenous Q12H   Background 49 y.o. year-old AAF with a background of MO, chronic lymphedema with massive elephantiasis (moves from bed to chair only), diastolic dysfunction, baseline CKD 3/4 (baseline creatinines available during 2016 appear to have been running 1.6-2.3; felt d/t DM). She was admitted to [facility] on 11/13/15 with RLE cellulitus, worsening edema, and creatinine 1.62 - developed AKI on CKD with associated hyperkalemia. Was diuretic unresponsive and kayexalate intolerant.  She received HD on  11/28, 11/29, 12/2, 12/8 via a temporary right femoral catheter placed 11/28. She was transferred 12/9 ostensibly for placement of TDC in IR and was then to be transferred back to [facility]. She received versed and fentanyl for conscious sedation and shortly afterward had a respiratory arrest. Responded to Narcan, romazicon and brief chest compressions awakened after about 2 minutes. She was transferred to the ICU for observation. The current plan is to try again to place catheter in IR without sedation and if stable over next few days transfer back to [facility]. We were asked to follow and to address any emerging dialysis needs.  Assessment/Recommendations  1. AKI on CKD - Oligoanuric. Presently HD dependent. Receiving HD at [facility] since 11/17/15. Still has R femoral catheter in place. [employer] aborted 12/9 after resp arrest post sedation. IR to place today with local only. Has HD orders for today after [facility] placed and if it works, will pull the femoral (I called HD with the order modification - appears night shift RN released orders so cannot "modify on paper"...).  2. Massive lymphedema with elephantiasis of LE's 3. OHV/hypercarbic resp failure - BIPAP at night (per notes has frequently declined to wear during [facility] admission results in mild int confusion; ABG 11/29 w/pH 7.1, pCO2 79.7 on 11/29 and not repeated since that time). 4. Chronic diastolic CHF 5. DM 6. HTN 7. MO 8. Anemia - tsat low. Start ferrlecit with HD - give dose while here at [facility] 9. CKD-MBD. Last PTH I see is from 2013. PTH pending. Phos 5.1 no binders. 10. Disposition - TDC placement, HD then anticipate back to [facility] as was the original plan.   Jamal Maes, MD [facility] [phone] Pager 11/29/2015, 8:42 AM

## 2015-11-30 DIAGNOSIS — K920 Hematemesis: Secondary | ICD-10-CM

## 2015-11-30 DIAGNOSIS — R110 Nausea: Secondary | ICD-10-CM

## 2015-11-30 LAB — BASIC METABOLIC PANEL
ANION GAP: 9 (ref 5–15)
BUN: 38 mg/dL — ABNORMAL HIGH (ref 6–20)
CO2: 29 mmol/L (ref 22–32)
Calcium: 8.1 mg/dL — ABNORMAL LOW (ref 8.9–10.3)
Chloride: 97 mmol/L — ABNORMAL LOW (ref 101–111)
Creatinine, Ser: 3.11 mg/dL — ABNORMAL HIGH (ref 0.44–1.00)
GFR calc Af Amer: 19 mL/min — ABNORMAL LOW (ref >60)
GFR, EST NON AFRICAN AMERICAN: 16 mL/min — AB (ref >60)
GLUCOSE: 126 mg/dL — AB (ref 65–99)
POTASSIUM: 3.9 mmol/L (ref 3.5–5.1)
Sodium: 135 mmol/L (ref 135–145)

## 2015-11-30 LAB — CBC
HCT: 29.5 % — ABNORMAL LOW (ref 36.0–46.0)
Hemoglobin: 9.3 g/dL — ABNORMAL LOW (ref 12.0–15.0)
MCH: 27 pg (ref 26.0–34.0)
MCHC: 31.5 g/dL (ref 30.0–36.0)
MCV: 85.5 fL (ref 78.0–100.0)
PLATELETS: 207 10*3/uL (ref 150–400)
RBC: 3.45 MIL/uL — AB (ref 3.87–5.11)
RDW: 17.2 % — AB (ref 11.5–15.5)
WBC: 6.6 10*3/uL (ref 4.0–10.5)

## 2015-11-30 LAB — GLUCOSE, CAPILLARY
GLUCOSE-CAPILLARY: 111 mg/dL — AB (ref 65–99)
GLUCOSE-CAPILLARY: 113 mg/dL — AB (ref 65–99)
Glucose-Capillary: 115 mg/dL — ABNORMAL HIGH (ref 65–99)
Glucose-Capillary: 138 mg/dL — ABNORMAL HIGH (ref 65–99)
Glucose-Capillary: 159 mg/dL — ABNORMAL HIGH (ref 65–99)

## 2015-11-30 LAB — PARATHYROID HORMONE, INTACT (NO CA): PTH: 127 pg/mL — AB (ref 15–65)

## 2015-11-30 MED ORDER — PANTOPRAZOLE SODIUM 40 MG IV SOLR
40.0000 mg | Freq: Two times a day (BID) | INTRAVENOUS | Status: DC
  Administered 2015-11-30 – 2015-12-05 (×11): 40 mg via INTRAVENOUS
  Filled 2015-11-30 (×13): qty 40

## 2015-11-30 NOTE — Progress Notes (Signed)
PULMONARY / CRITICAL CARE MEDICINE   Name: [name] MRN: [id number] DOB: 10-12-1966    ADMISSION DATE:  11/13/2015 CONSULTATION DATE:  11/28/15  REFERRING MD:  Dr Larena Glassman  - IR  CHIEF COMPLAINT:  Post sedation respiratory arrest  Brief: Admitted to [facility] on 11/24 with leg cellulitis, hypoxemia due to CHF and obesity hypoventilation syndrome.  Developed AKI, had a cardiac arrest with perm cath placement after conscious sedation on 12/9, so transferred to [facility].  EVENTS 11/13/2015 - admit to [place] cellultis 11/21/15 - rt groin HD cath and first HD 11/26/15 or 12/8./16- 2nd HD at [facility]. Ongoing intermittent delirium include noncompliance with BiPAP noted at [facility] 11/28/15 - respiratory followed by cardiac arrest following conscious sedation drugs at [facility] IR suite. Return of spontaneous circulation, normal blood pressure and mental status following CPR 12/10 tunneled HD cath placed, dialyzed 12/11 threw up blood  SUBJECTIVE:  Threw up some blood this morning No belly pain Eating OK HD stable  VITAL SIGNS: BP 160/80 mmHg  Pulse 90  Temp(Src) 98.6 F (37 C) (Oral)  Resp 20  Ht _0  (1.651 m)  Wt 199.129 kg (439 lb)  BMI 73.05 kg/m2  SpO2 97%  HEMODYNAMICS:    VENTILATOR SETTINGS:    INTAKE / OUTPUT: I/O last 3 completed shifts: In: -  Out: 3452 [Urine:925; Other:2527]  PHYSICAL EXAMINATION:  Gen: morbid obesity, no distress HEENT: NCAT, OP clear PULM: CTA B normal effort CV: RRR, no mgr GI: BS+, soft, nontender MSK: normal bulk, tone Derm: chronic edema Neuro: A&Ox4, maew   LABS: PULMONARY No results for input(s): PHART, PCO2ART, PO2ART, HCO3, TCO2, O2SAT in the last 168 hours.  Invalid input(s): PCO2, PO2  CBC  Recent Labs Lab 11/28/15 0608 11/28/15 1530 11/29/15 0410  HGB 10.4* 10.3* 9.5*  HCT 32.8* 33.2* 30.0*  WBC 8.0 9.0 6.7  PLT 213 212 214    COAGULATION  Recent Labs Lab 11/27/15 1314  11/29/15 0410  INR 1.28 1.37    CARDIAC    Recent Labs Lab 11/28/15 1530 11/28/15 2017 11/29/15 0410  TROPONINI <0.03 <0.03 <0.03   No results for input(s): PROBNP in the last 168 hours.   CHEMISTRY  Recent Labs Lab 11/26/15 0525 11/27/15 0552 11/28/15 0608 11/28/15 1530 11/29/15 0410 11/30/15 0242  NA 134*  --  134* 135 136 135  K 4.3  --  4.3 4.3 4.4 3.9  CL 98*  --  97* 98* 99* 97*  CO2 30  --  _1 GLUCOSE 200*  --  155* 180* 122* 126*  BUN 70*  --  59* 55* 61* 38*  CREATININE 4.28* 4.58* 4.08* 4.28* 4.39* 3.11*  CALCIUM 8.4*  --  8.7* 8.7* 8.7* 8.1*  PHOS  --   --   --   --  5.1*  --    Estimated Creatinine Clearance: 39.3 mL/min (by C-G formula based on Cr of 3.11).   LIVER  Recent Labs Lab 11/27/15 1314 11/29/15 0410  AST  --  11*  ALT  --  11*  ALKPHOS  --  104  BILITOT  --  1.3*  PROT  --  6.4*  ALBUMIN  --  2.3*  2.4*  INR 1.28 1.37     INFECTIOUS  Recent Labs Lab 11/28/15 1530  LATICACIDVEN 0.8     ENDOCRINE CBG (last 3)   Recent Labs  11/29/15 1753 11/30/15 0055 11/30/15 0807  GLUCAP 140* 113* 115*  IMAGING x48h  - image(s) personally visualized  -   highlighted in bold Ir Fluoro Guide Cv Line Right  11/29/2015  CLINICAL DATA:  End-stage renal disease EXAM: ULTRASOUND GUIDANCE FOR VASCULAR ACCESS RIGHT INTERNAL JUGULAR PERMANENT HEMODIALYSIS CATHETER Date:  12/10/201612/09/2015 11:37 am Radiologist:  M. Daryll Brod, MD Guidance:  Ultrasound and fluoroscopic FLUOROSCOPY TIME:  0.8 minutes, 7.1 mGy MEDICATIONS AND MEDICAL HISTORY: 2 g Ancef administered within 1 hour of the procedure, 1% lidocaine locally ANESTHESIA/SEDATION: None. CONTRAST:  None COMPLICATIONS: None immediate PROCEDURE: Informed consent was obtained from the patient following explanation of the procedure, risks, benefits and alternatives. The patient understands, agrees and consents for the procedure. All questions were addressed. A time  out was performed. Maximal barrier sterile technique utilized including caps, mask, sterile gowns, sterile gloves, large sterile drape, hand hygiene, and 2% chlorhexidine scrub. Under sterile conditions and local anesthesia, right internal jugular micropuncture venous access was performed with ultrasound. Images were obtained for documentation. A guide wire was inserted followed by a transitional dilator. Next, a 0.035 guidewire was advanced into the IVC with a 5-French catheter. Measurements were obtained from the right venotomy site to the proximal right atrium. In the right infraclavicular chest, a subcutaneous tunnel was created under sterile conditions and local anesthesia. 1% lidocaine with epinephrine was utilized for this. The 23 cm tip to cuff dialysis catheter was tunneled subcutaneously to the venotomy site and inserted into the SVC/RA junction through a valved peel-away sheath. Position was confirmed with fluoroscopy. Images were obtained for documentation. Blood was aspirated from the catheter followed by saline and heparin flushes. The appropriate volume and strength of heparin was instilled in each lumen. Caps were applied. The catheter was secured at the tunnel site with Gelfoam and a pursestring suture. The venotomy site was closed with subcuticular Vicryl suture. Dermabond was applied to the small right neck incision. A dry sterile dressing was applied. The catheter is ready for use. No immediate complications. IMPRESSION: Ultrasound and fluoroscopically guided right internal jugular tunneled hemodialysis catheter (23 cm tip to cuff dialysis catheter). Electronically Signed   By: Jerilynn Mages.  Shick M.D.   On: 11/29/2015 12:13   Ir US Guide Vasc Access Right  11/29/2015  CLINICAL DATA:  End-stage renal disease EXAM: ULTRASOUND GUIDANCE FOR VASCULAR ACCESS RIGHT INTERNAL JUGULAR PERMANENT HEMODIALYSIS CATHETER Date:  12/10/201612/09/2015 11:37 am Radiologist:  M. Daryll Brod, MD Guidance:  Ultrasound and  fluoroscopic FLUOROSCOPY TIME:  0.8 minutes, 7.1 mGy MEDICATIONS AND MEDICAL HISTORY: 2 g Ancef administered within 1 hour of the procedure, 1% lidocaine locally ANESTHESIA/SEDATION: None. CONTRAST:  None COMPLICATIONS: None immediate PROCEDURE: Informed consent was obtained from the patient following explanation of the procedure, risks, benefits and alternatives. The patient understands, agrees and consents for the procedure. All questions were addressed. A time out was performed. Maximal barrier sterile technique utilized including caps, mask, sterile gowns, sterile gloves, large sterile drape, hand hygiene, and 2% chlorhexidine scrub. Under sterile conditions and local anesthesia, right internal jugular micropuncture venous access was performed with ultrasound. Images were obtained for documentation. A guide wire was inserted followed by a transitional dilator. Next, a 0.035 guidewire was advanced into the IVC with a 5-French catheter. Measurements were obtained from the right venotomy site to the proximal right atrium. In the right infraclavicular chest, a subcutaneous tunnel was created under sterile conditions and local anesthesia. 1% lidocaine with epinephrine was utilized for this. The 23 cm tip to cuff dialysis catheter was tunneled subcutaneously to the venotomy site and  inserted into the SVC/RA junction through a valved peel-away sheath. Position was confirmed with fluoroscopy. Images were obtained for documentation. Blood was aspirated from the catheter followed by saline and heparin flushes. The appropriate volume and strength of heparin was instilled in each lumen. Caps were applied. The catheter was secured at the tunnel site with Gelfoam and a pursestring suture. The venotomy site was closed with subcuticular Vicryl suture. Dermabond was applied to the small right neck incision. A dry sterile dressing was applied. The catheter is ready for use. No immediate complications. IMPRESSION: Ultrasound and  fluoroscopically guided right internal jugular tunneled hemodialysis catheter (23 cm tip to cuff dialysis catheter). Electronically Signed   By: Jerilynn Mages.  Shick M.D.   On: 11/29/2015 12:13    MICRO    DISCUSSION: Morbidly obese female with acute on chronic renal failure based on sleep apnea noncompliant with BiPAP. Prolonged hospitalization at [facility] for cellulitis and acute on chronic kidney failure. Status post respiratory arrest following conscious sedation but with return to sporting circulation, blood pressure and mental status. Now doing well with out evidence of neurologic injury 12/10.  Received HD cath and HD yesterday.  Slept OK but threw up "a handful" of bright red blood this morning.  Not clear to me if this is a mallory weis type care or ulcerative but we need to keep her at [facility] today to monitor this.  ASSESSMENT / PLAN:  PULMONARY A: Respiratory followed by cardiac arrest > resolved Obesity hypoventilation syndrome  P:   BiPAP qHS Monitor O2  CARDIOVASCULAR A:  Cardiac arrest following respiratory arrest but with return of spontaneous circulation blood pressure > now stable Hypertension P:  Tele Continue metoprolol   RENAL A:   Acute on chronic renal failure. Status post tunneled HD cath P:   HD per renal Renal diet Monitor BMET   GASTROINTESTINAL A:   Hematemesis 12/11, no belly pain, tenderness or melena, hemodynamically stable > ddx mallory weis vs less likely gastric/duodenal ulcer P:   Start IV PPI BID Repeat CBC today Monitor for bleeding Continue diet for now [facility] for zofran and phenergan for the moment   HEMATOLOGIC A:   Anemia of chronic disease and critical illness See GI P:  PRBC for hemoglobin less than 7 g percent Hold lovenox   INFECTIOUS A:   Admitted 11/13/2015 for cellulitis of the lower extremity P:   Continue antibiotics that were being administered at [facility] - cefazolin 128/16 [place] 11/24 >>  12/5   ENDOCRINE A:   At risk for hyperglycemia   P:   Sliding scale insulin  NEUROLOGIC A:   Chronic pain meds and benzodiazepine P:   Monitor with BiPAP Hold all baseline Xanax and oxycodone   FAMILY  - Updates: Husband updated bedside 12/11  Roselie Awkward, MD [place] [facility] Pager: [phone] Cell: [phone] After 3pm or if no response, call [phone]  11/30/2015 10:36 AM

## 2015-11-30 NOTE — Progress Notes (Signed)
Patient currently not wearing bipap at this time.  Will continue to monitor patient.

## 2015-11-30 NOTE — Progress Notes (Signed)
[name]  Subjective: TDC R IJ placed yesterday - no sedation Worked well for HD  Vital signs in last 24 hours: Filed Vitals:   11/30/15 0600 11/30/15 0700 11/30/15 0800 11/30/15 0805  BP: 114/56 127/66 116/58   Pulse: 67 66 62   Temp:    98.6 F (37 C)  TempSrc:    Oral  Resp: _0 Height:      Weight:      SpO2: 100% 86% 99%    Weight change: -1.943 kg (-4 lb 4.6 oz)  Intake/Output Summary (Last 24 hours) at 11/30/15 0847 Last data filed at 11/30/15 0600  Gross per 24 hour  Intake      0 ml  Output   2777 ml  Net  -2777 ml    Physical Exam:  Blood pressure 116/58, pulse 62, temperature 98.6 F (37 C), temperature source Oral, resp. rate 9, height _1  (1.651 m), weight 199.129 kg (439 lb), SpO2 99 %. Gen: MO AAF Very pleasant "ready to get back to [facility]" PICC in right arm, portacath in left chest, new TDC R IJ (12/10) Neck: Cannot see neck veins Chest: Ant clear Heart: Regular. Distant heart sounds. O3A91 No S3. Abdomen: Massive pannus with abd wall edema. Not tender Ext: Massive legs. Elephantiasis.   Labs:   Recent Labs Lab 11/24/15 0731 11/25/15 0759 11/26/15 0525 11/27/15 0552 11/28/15 0608 11/28/15 1530 11/29/15 0410 11/30/15 0242  NA 136 135 134*  --  134* 135 136 135  K 4.3 4.4 4.3  --  4.3 4.3 4.4 3.9  CL 100* 99* 98*  --  97* 98* 99* 97*  CO2 _2 --  _3 GLUCOSE 129* 135* 200*  --  155* 180* 122* 126*  BUN 59* 66* 70*  --  59* 55* 61* 38*  CREATININE 3.91* 3.88* 4.28* 4.58* 4.08* 4.28* 4.39* 3.11*  CALCIUM 8.3* 8.5* 8.4*  --  8.7* 8.7* 8.7* 8.1*  PHOS  --   --   --   --   --   --  5.1*  --      Recent Labs Lab 11/29/15 0410  AST 11*  ALT 11*  ALKPHOS 104  BILITOT 1.3*  PROT 6.4*  ALBUMIN 2.3*  2.4*    Recent Labs Lab 11/27/15 0552 11/28/15 0608 11/28/15 1530 11/29/15 0410  WBC 9.3 8.0 9.0 6.7  HGB 10.4* 10.4* 10.3* 9.5*  HCT 32.7* 32.8* 33.2* 30.0*  MCV 88.6  89.1 88.5 87.0  PLT 224 213 212 214     Recent Labs Lab 11/28/15 1530 11/28/15 2017 11/29/15 0410  TROPONINI <0.03 <0.03 <0.03     Recent Labs Lab 11/29/15 0800 11/29/15 1326 11/29/15 1753 11/30/15 0055 11/30/15 0807  GLUCAP 105* 116* 140* 113* 115*     Results for [name], [name] (MRN [id number]) as of 11/29/2015 08:50  Ref. Range 11/29/2015 04:10  Iron Latest Ref Range: 28-170 ug/dL 40  UIBC Latest Units: ug/dL 237  TIBC Latest Ref Range: 250-450 ug/dL 277  Saturation Ratios Latest Ref Range: 10.4-31.8 % 14     Medications . feeding supplement (NEPRO CARB STEADY)  237 mL Oral Q24H  . feeding supplement (PRO-STAT SUGAR FREE 64)  30 mL Oral BID  . ferric gluconate (FERRLECIT/NULECIT) IV  125 mg Intravenous Once  . insulin aspart  0-20 Units Subcutaneous TID WC  . insulin aspart  0-5 Units Subcutaneous QHS  . metoprolol tartrate  25 mg Oral BID  . nystatin   Topical BID  . ondansetron (ZOFRAN) IV  4 mg Intravenous 4 times per day  . pantoprazole  40 mg Oral Q1200  . sodium chloride  10-40 mL Intracatheter Q12H  . sodium chloride  10-40 mL Intracatheter Q12H  . sodium chloride  3 mL Intravenous Q12H   Background 49 y.o. year-old AAF with a background of MO, chronic lymphedema with massive elephantiasis (moves from bed to chair only), diastolic dysfunction, baseline CKD 3/4 (baseline creatinines available during 2016 appear to have been running 1.6-2.3; felt d/t DM). She was admitted to [facility] on 11/13/15 with RLE cellulitus, worsening edema, and creatinine 1.62 - developed AKI on CKD with associated hyperkalemia. Was diuretic unresponsive and kayexalate intolerant.  She received HD on 11/28, 11/29, 12/2, 12/8 via a temporary right femoral catheter placed 11/28. She was transferred 12/9 ostensibly for placement of TDC in IR and was then to be transferred back to [facility]. She received versed and fentanyl for conscious sedation and shortly afterward had a respiratory arrest.  Responded to Narcan, romazicon and brief chest compressions awakened after about 2 minutes. She was transferred to the ICU for observation. We were asked to follow and to address any emerging dialysis needs.  Assessment/Recommendations  1. AKI on CKD - Oligoanuric. Presently HD dependent. Receiving HD at [facility] since 11/17/15.  [place] aborted 12/9 after resp arrest post sedation.  Was successfully placed 12/10 without sedation and worked well for HD yesterday. Will get fem cath out.  RN aware 2. Massive lymphedema with elephantiasis of LE's 3. OHV/hypercarbic resp failure - BIPAP at night  4. Chronic diastolic CHF 5. DM 6. HTN 7. MO 8. Anemia - tsat low. I had ordered dose of Ferrlecit to be given with HD yesterday - not given.  This can be further managed by Nephrology at [facility] 9. CKD-MBD. Last PTH I see is from 2013. PTH ordered/still pending. Phos 5.1 no binders. 10. Disposition - Pt to return to [facility] today. Will try to get fem cath out before transfer (if we can get IV team here before transport comes)   Jamal Maes, MD [place] Pager 11/30/2015, 8:47 AM

## 2015-12-01 LAB — CBC WITH DIFFERENTIAL/PLATELET
BASOS ABS: 0 10*3/uL (ref 0.0–0.1)
Basophils Absolute: 0 10*3/uL (ref 0.0–0.1)
Basophils Relative: 0 %
Basophils Relative: 0 %
EOS PCT: 3 %
Eosinophils Absolute: 0.2 10*3/uL (ref 0.0–0.7)
Eosinophils Absolute: 0.2 10*3/uL (ref 0.0–0.7)
Eosinophils Relative: 2 %
HEMATOCRIT: 36.2 % (ref 36.0–46.0)
HEMATOCRIT: 28.5 % — AB (ref 36.0–46.0)
HEMOGLOBIN: 11.3 g/dL — AB (ref 12.0–15.0)
HEMOGLOBIN: 9.2 g/dL — AB (ref 12.0–15.0)
LYMPHS PCT: 19 %
Lymphocytes Relative: 19 %
Lymphs Abs: 1 10*3/uL (ref 0.7–4.0)
Lymphs Abs: 1.1 10*3/uL (ref 0.7–4.0)
MCH: 26.8 pg (ref 26.0–34.0)
MCH: 28 pg (ref 26.0–34.0)
MCHC: 31.2 g/dL (ref 30.0–36.0)
MCHC: 32.3 g/dL (ref 30.0–36.0)
MCV: 86 fL (ref 78.0–100.0)
MCV: 86.6 fL (ref 78.0–100.0)
MONO ABS: 0.4 10*3/uL (ref 0.1–1.0)
MONOS PCT: 10 %
MONOS PCT: 7 %
Monocytes Absolute: 0.5 10*3/uL (ref 0.1–1.0)
NEUTROS ABS: 4.4 10*3/uL (ref 1.7–7.7)
NEUTROS PCT: 68 %
NEUTROS PCT: 72 %
Neutro Abs: 3.3 10*3/uL (ref 1.7–7.7)
Platelets: 94 10*3/uL — ABNORMAL LOW (ref 150–400)
Platelets: 187 10*3/uL (ref 150–400)
RBC: 4.21 MIL/uL (ref 3.87–5.11)
RBC: 3.29 MIL/uL — ABNORMAL LOW (ref 3.87–5.11)
RDW: 17.6 % — ABNORMAL HIGH (ref 11.5–15.5)
RDW: 17.3 % — AB (ref 11.5–15.5)
WBC: 5 10*3/uL (ref 4.0–10.5)
WBC: 6.2 10*3/uL (ref 4.0–10.5)

## 2015-12-01 LAB — BASIC METABOLIC PANEL
Anion gap: 9 (ref 5–15)
BUN: 43 mg/dL — AB (ref 6–20)
CHLORIDE: 98 mmol/L — AB (ref 101–111)
CO2: 27 mmol/L (ref 22–32)
Calcium: 8.4 mg/dL — ABNORMAL LOW (ref 8.9–10.3)
Creatinine, Ser: 3.31 mg/dL — ABNORMAL HIGH (ref 0.44–1.00)
GFR calc non Af Amer: 15 mL/min — ABNORMAL LOW (ref >60)
GFR, EST AFRICAN AMERICAN: 18 mL/min — AB (ref >60)
Glucose, Bld: 144 mg/dL — ABNORMAL HIGH (ref 65–99)
POTASSIUM: 3.7 mmol/L (ref 3.5–5.1)
SODIUM: 134 mmol/L — AB (ref 135–145)

## 2015-12-01 LAB — GLUCOSE, CAPILLARY
Glucose-Capillary: 143 mg/dL — ABNORMAL HIGH (ref 65–99)
Glucose-Capillary: 184 mg/dL — ABNORMAL HIGH (ref 65–99)
Glucose-Capillary: 164 mg/dL — ABNORMAL HIGH (ref 65–99)
Glucose-Capillary: 182 mg/dL — ABNORMAL HIGH (ref 65–99)

## 2015-12-01 MED ORDER — POTASSIUM CHLORIDE CRYS ER 20 MEQ PO TBCR: 40.0000 meq | EXTENDED_RELEASE_TABLET | Freq: Once | ORAL | Status: DC

## 2015-12-01 NOTE — Progress Notes (Signed)
Report called to Ronalee Belts RN at [facility]. Will call [facility] fo transport. Patient informed.

## 2015-12-01 NOTE — Clinical Social Work Note (Signed)
CSW received call from Cecil Cranker [phone]) with guest services at [facility]. Pt has chair time set at [facility] for Tuesday, Thursday, Saturdays at 11:25. [facility] CSW notified.   [name], [place]

## 2015-12-01 NOTE — Progress Notes (Signed)
Pt was on a bariatric SizeWise bed after transfer.  The SizeWise bed has a mechanical failure that rendered it unusable, so she was tranfered to regualr ICU bed.  Nursing staff will place pt on a bariatric bed if one come available.  Currently, there are none in the facility.

## 2015-12-01 NOTE — Progress Notes (Signed)
   HEMODIALYSIS NURSING NOTE:  Pt has transferred back to [facility] from [place].  Had plans for hemodialysis today at [facility] but transferred prior to receiving treatment.  [facility] nephrologist's note copied below. Labs/assessment reviewed with Dr. Lowanda Foster.  Plan for HD tomorrow.   Disposition- transfer back to [facility] today. Can have HD at [facility] either later today or tomorrow per Dr. Florentina Addison discretion. Volume and electrolytes stable to hold off on HD.  [place] RN, CDN

## 2015-12-01 NOTE — Progress Notes (Signed)
PULMONARY / CRITICAL CARE MEDICINE   Name: [name] MRN: [id number] DOB: 1966/04/22    ADMISSION DATE:  11/13/2015 CONSULTATION DATE:  11/28/15  REFERRING MD:  Dr Larena Glassman  - IR  CHIEF COMPLAINT:  Post sedation respiratory arrest  Brief: Admitted to [facility] on 11/24 with leg cellulitis, hypoxemia due to CHF and obesity hypoventilation syndrome.  Developed AKI, had a cardiac arrest with perm cath placement after conscious sedation on 12/9, so transferred to [facility].  EVENTS 11/24 - admit to [facility] 11/21/15 - rt groin HD cath and first HD 12/07 or 12/8 - 2nd HD at [facility]. Ongoing intermittent delirium include noncompliance with BiPAP noted at [facility] 12/09 - respiratory followed by cardiac arrest following conscious sedation drugs at [facility] IR suite. Return of spontaneous circulation, normal blood pressure and mental status following CPR 12/10 - tunneled HD cath placed, dialyzed 12/11 - threw up blood  SUBJECTIVE: Had hematemesis on 12/11. Denies any further hematemesis. Patient is having normal bowel movements without melena or hematochezia per nursing report. Continues to deny any abdominal pain or nausea. No chest pain or pressure.  REVIEW OF SYSTEMS: No dyspnea or cough. No subjective fever, chills, or sweats.  VITAL SIGNS: BP 137/75 mmHg  Pulse 74  Temp(Src) 98.7 F (37.1 C) (Oral)  Resp 18  Ht _0  (1.651 m)  Wt 429 lb (194.593 kg)  BMI 71.39 kg/m2  SpO2 92%  HEMODYNAMICS:    VENTILATOR SETTINGS:    INTAKE / OUTPUT: I/O last 3 completed shifts: In: 240 [P.O.:240] Out: 4098 [Urine:750; Other:2527]  PHYSICAL EXAMINATION: General:  Awake. Alert. No acute distress. Watching TV.  Integument:  Warm & dry. No rash on exposed skin.  HEENT:  Moist mucus membranes. No oral ulcers. No scleral injection or icterus.  Cardiovascular:  Regular rate. Chronic lower extremity edema. No appreciable JVD given body habitus.  Pulmonary:  Good  aeration & clear to auscultation bilaterally. Normal work of breathing. Abdomen: Soft. Normal bowel sounds. Protuberant. Grossly nontender. Neurological: Oriented person, place, time and situation. CN 2-12 in tact. Grossly nonfocal.   LABS: PULMONARY No results for input(s): PHART, PCO2ART, PO2ART, HCO3, TCO2, O2SAT in the last 168 hours.  Invalid input(s): PCO2, PO2  CBC  Recent Labs Lab 11/29/15 0410 11/30/15 1558 12/01/15 0429  HGB 9.5* 9.3* 11.3*  HCT 30.0* 29.5* 36.2  WBC 6.7 6.6 5.0  PLT 214 207 94*    COAGULATION  Recent Labs Lab 11/27/15 1314 11/29/15 0410  INR 1.28 1.37    CARDIAC    Recent Labs Lab 11/28/15 1530 11/28/15 2017 11/29/15 0410  TROPONINI <0.03 <0.03 <0.03   No results for input(s): PROBNP in the last 168 hours.   CHEMISTRY  Recent Labs Lab 11/28/15 0608 11/28/15 1530 11/29/15 0410 11/30/15 0242 12/01/15 0429  NA 134* 135 136 135 134*  K 4.3 4.3 4.4 3.9 3.7  CL 97* 98* 99* 97* 98*  CO2 _1 GLUCOSE 155* 180* 122* 126* 144*  BUN 59* 55* 61* 38* 43*  CREATININE 4.08* 4.28* 4.39* 3.11* 3.31*  CALCIUM 8.7* 8.7* 8.7* 8.1* 8.4*  PHOS  --   --  5.1*  --   --    Estimated Creatinine Clearance: 36.4 mL/min (by C-G formula based on Cr of 3.31).   LIVER  Recent Labs Lab 11/27/15 1314 11/29/15 0410  AST  --  11*  ALT  --  11*  ALKPHOS  --  104  BILITOT  --  1.3*  PROT  --  6.4*  ALBUMIN  --  2.3*  2.4*  INR 1.28 1.37     INFECTIOUS  Recent Labs Lab 11/28/15 1530  LATICACIDVEN 0.8     ENDOCRINE CBG (last 3)   Recent Labs  11/30/15 1632 11/30/15 2156 12/01/15 0759  GLUCAP 138* 159* 143*         IMAGING x48h  - image(s) personally visualized  -   highlighted in bold Ir Fluoro Guide Cv Line Right  11/29/2015  CLINICAL DATA:  End-stage renal disease EXAM: ULTRASOUND GUIDANCE FOR VASCULAR ACCESS RIGHT INTERNAL JUGULAR PERMANENT HEMODIALYSIS CATHETER Date:  12/10/201612/09/2015 11:37 am  Radiologist:  M. Daryll Brod, MD Guidance:  Ultrasound and fluoroscopic FLUOROSCOPY TIME:  0.8 minutes, 7.1 mGy MEDICATIONS AND MEDICAL HISTORY: 2 g Ancef administered within 1 hour of the procedure, 1% lidocaine locally ANESTHESIA/SEDATION: None. CONTRAST:  None COMPLICATIONS: None immediate PROCEDURE: Informed consent was obtained from the patient following explanation of the procedure, risks, benefits and alternatives. The patient understands, agrees and consents for the procedure. All questions were addressed. A time out was performed. Maximal barrier sterile technique utilized including caps, mask, sterile gowns, sterile gloves, large sterile drape, hand hygiene, and 2% chlorhexidine scrub. Under sterile conditions and local anesthesia, right internal jugular micropuncture venous access was performed with ultrasound. Images were obtained for documentation. A guide wire was inserted followed by a transitional dilator. Next, a 0.035 guidewire was advanced into the IVC with a 5-French catheter. Measurements were obtained from the right venotomy site to the proximal right atrium. In the right infraclavicular chest, a subcutaneous tunnel was created under sterile conditions and local anesthesia. 1% lidocaine with epinephrine was utilized for this. The 23 cm tip to cuff dialysis catheter was tunneled subcutaneously to the venotomy site and inserted into the SVC/RA junction through a valved peel-away sheath. Position was confirmed with fluoroscopy. Images were obtained for documentation. Blood was aspirated from the catheter followed by saline and heparin flushes. The appropriate volume and strength of heparin was instilled in each lumen. Caps were applied. The catheter was secured at the tunnel site with Gelfoam and a pursestring suture. The venotomy site was closed with subcuticular Vicryl suture. Dermabond was applied to the small right neck incision. A dry sterile dressing was applied. The catheter is ready for  use. No immediate complications. IMPRESSION: Ultrasound and fluoroscopically guided right internal jugular tunneled hemodialysis catheter (23 cm tip to cuff dialysis catheter). Electronically Signed   By: Jerilynn Mages.  Shick M.D.   On: 11/29/2015 12:13   Ir US Guide Vasc Access Right  11/29/2015  CLINICAL DATA:  End-stage renal disease EXAM: ULTRASOUND GUIDANCE FOR VASCULAR ACCESS RIGHT INTERNAL JUGULAR PERMANENT HEMODIALYSIS CATHETER Date:  12/10/201612/09/2015 11:37 am Radiologist:  M. Daryll Brod, MD Guidance:  Ultrasound and fluoroscopic FLUOROSCOPY TIME:  0.8 minutes, 7.1 mGy MEDICATIONS AND MEDICAL HISTORY: 2 g Ancef administered within 1 hour of the procedure, 1% lidocaine locally ANESTHESIA/SEDATION: None. CONTRAST:  None COMPLICATIONS: None immediate PROCEDURE: Informed consent was obtained from the patient following explanation of the procedure, risks, benefits and alternatives. The patient understands, agrees and consents for the procedure. All questions were addressed. A time out was performed. Maximal barrier sterile technique utilized including caps, mask, sterile gowns, sterile gloves, large sterile drape, hand hygiene, and 2% chlorhexidine scrub. Under sterile conditions and local anesthesia, right internal jugular micropuncture venous access was performed with ultrasound. Images were obtained for documentation. A guide wire was inserted followed by  a transitional dilator. Next, a 0.035 guidewire was advanced into the IVC with a 5-French catheter. Measurements were obtained from the right venotomy site to the proximal right atrium. In the right infraclavicular chest, a subcutaneous tunnel was created under sterile conditions and local anesthesia. 1% lidocaine with epinephrine was utilized for this. The 23 cm tip to cuff dialysis catheter was tunneled subcutaneously to the venotomy site and inserted into the SVC/RA junction through a valved peel-away sheath. Position was confirmed with fluoroscopy. Images  were obtained for documentation. Blood was aspirated from the catheter followed by saline and heparin flushes. The appropriate volume and strength of heparin was instilled in each lumen. Caps were applied. The catheter was secured at the tunnel site with Gelfoam and a pursestring suture. The venotomy site was closed with subcuticular Vicryl suture. Dermabond was applied to the small right neck incision. A dry sterile dressing was applied. The catheter is ready for use. No immediate complications. IMPRESSION: Ultrasound and fluoroscopically guided right internal jugular tunneled hemodialysis catheter (23 cm tip to cuff dialysis catheter). Electronically Signed   By: Jerilynn Mages.  Shick M.D.   On: 11/29/2015 12:13    ASSESSMENT / PLAN:  PULMONARY A: Respiratory followed by cardiac arrest - Resolved Obesity hypoventilation syndrome  P:   BiPAP qHS Monitor pulse ox  CARDIOVASCULAR A:  Cardiac arrest following respiratory arrest - ROSC & now stable.  H/O Hypertension  P:  Monitor in Telemetry Lopressor bid  RENAL A:   Acute on Chronic Renal Failure - Status post tunneled HD cath.  P:   Nephrology following  HD per Nephrology Monitoring electrolytes daily  GASTROINTESTINAL A:   Hematemesis 12/11 - None since. Differential includes Mallory Weiss Tear vs PUD less likely.   P:   Protonix IV BID Renal Diet w/ Fluid Restriction Repeat CBC today Monitor for bleeding Continue diet for now Zofran & Phenergan PRN Consider GI consult for possible EGD   HEMATOLOGIC A:   Anemia - Stable Hgb. Questionable bleeding. Thrombocytopenia - Question lab error.  P:  PRBC for hemoglobin less than 7 g percent Hold Chemical DVT Prophylaxis Repeat CBC now  INFECTIOUS A:   LLE Cellulitis - Admitted 11/24.  P:   Continue antibiotics that were being administered at [facility] - Cefazolin [place] 11/24 - 12/5   ENDOCRINE A:   Hyperglycemia   P:   Accu-Checks qAC & HS SSI per  algorithm  NEUROLOGIC A:   Chronic pain - Chronic Narcotics & Benzos.  P:   Monitor with BiPAP qhs Hold all baseline Xanax and oxycodone   FAMILY  - Updates: Husband updated bedside 12/11 by Dr.McQuaid.  TODAY'S SUMMARY:  Morbidly obese female with acute on chronic renal failure based on sleep apnea noncompliant with BiPAP. Prolonged hospitalization at [facility] for cellulitis and acute on chronic kidney failure. Status post respiratory arrest following conscious sedation but with return to sporting circulation, blood pressure and mental status. Now doing well with out evidence of neurologic injury 12/10.  Patient did have brief hematemesis on 12/11 in AM. No further signs of active bleeding. Repeating cell count with CBC this morning. Plan to transfer the patient back to [facility] for ongoing care. Transfer discussed with Dr. [name] by Dr. Lake Bells on 12/11.  I have spent a total of  31 minutes of critical care time today caring for the patient & reviewing the patient's electronic medical record.  Sonia Baller Ashok Cordia, M.D. [facility] Pulmonary & Critical Care Pager:  [phone] After 3pm or  if no response, call [phone] 12/01/2015 9:48 AM

## 2015-12-01 NOTE — Progress Notes (Signed)
Patient ID: [name], female   DOB: 11-05-1966, 49 y.o.   MRN: [id number] S:Feels much better today, no more N/V O:BP 134/68 mmHg  Pulse 71  Temp(Src) 98.5 F (36.9 C) (Oral)  Resp 17  Ht 5' 5" (1.651 m)  Wt 194.593 kg (429 lb)  BMI 71.39 kg/m2  SpO2 100%  Intake/Output Summary (Last 24 hours) at 12/01/15 0857 Last data filed at 12/01/15 0600  Gross per 24 hour  Intake    240 ml  Output    500 ml  Net   -260 ml   Intake/Output: I/O last 3 completed shifts: In: 240 [P.O.:240] Out: 9485 [Urine:750; Other:2527]  Intake/Output this shift:    Weight change: -4.407 kg (-9 lb 11.5 oz) Gen:WD obese AAF in NAD CVS:no rub Resp:cta IOE:VOJJKK XFG:HWEXHB elephantiasis of lower extremities   Recent Labs Lab 11/25/15 0759 11/26/15 0525 11/27/15 0552 11/28/15 0608 11/28/15 1530 11/29/15 0410 11/30/15 0242 12/01/15 0429  NA 135 134*  --  134* 135 136 135 134*  K 4.4 4.3  --  4.3 4.3 4.4 3.9 3.7  CL 99* 98*  --  97* 98* 99* 97* 98*  CO2 29 30  --  _0 GLUCOSE 135* 200*  --  155* 180* 122* 126* 144*  BUN 66* 70*  --  59* 55* 61* 38* 43*  CREATININE 3.88* 4.28* 4.58* 4.08* 4.28* 4.39* 3.11* 3.31*  ALBUMIN  --   --   --   --   --  2.3*  2.4*  --   --   CALCIUM 8.5* 8.4*  --  8.7* 8.7* 8.7* 8.1* 8.4*  PHOS  --   --   --   --   --  5.1*  --   --   AST  --   --   --   --   --  11*  --   --   ALT  --   --   --   --   --  11*  --   --    Liver Function Tests:  Recent Labs Lab 11/29/15 0410  AST 11*  ALT 11*  ALKPHOS 104  BILITOT 1.3*  PROT 6.4*  ALBUMIN 2.3*  2.4*   No results for input(s): LIPASE, AMYLASE in the last 168 hours. No results for input(s): AMMONIA in the last 168 hours. CBC:  Recent Labs Lab 11/28/15 0608 11/28/15 1530 11/29/15 0410 11/30/15 1558 12/01/15 0429  WBC 8.0 9.0 6.7 6.6 5.0  NEUTROABS  --   --   --   --  3.3  HGB 10.4* 10.3* 9.5* 9.3* 11.3*  HCT 32.8* 33.2* 30.0* 29.5* 36.2  MCV 89.1 88.5 87.0 85.5 86.0  PLT 213  212 214 207 94*   Cardiac Enzymes:  Recent Labs Lab 11/28/15 1530 11/28/15 2017 11/29/15 0410  TROPONINI <0.03 <0.03 <0.03   CBG:  Recent Labs Lab 11/30/15 0055 11/30/15 0807 11/30/15 1213 11/30/15 1632 11/30/15 2156  GLUCAP 113* 115* 111* 138* 159*    Iron Studies:  Recent Labs  11/29/15 0410  IRON 40  TIBC 277   Studies/Results: Ir Fluoro Guide Cv Line Right  11/29/2015  CLINICAL DATA:  End-stage renal disease EXAM: ULTRASOUND GUIDANCE FOR VASCULAR ACCESS RIGHT INTERNAL JUGULAR PERMANENT HEMODIALYSIS CATHETER Date:  12/10/201612/09/2015 11:37 am Radiologist:  M. Daryll Brod, MD Guidance:  Ultrasound and fluoroscopic FLUOROSCOPY TIME:  0.8 minutes, 7.1 mGy MEDICATIONS AND MEDICAL HISTORY: 2 g Ancef administered within 1 hour of  the procedure, 1% lidocaine locally ANESTHESIA/SEDATION: None. CONTRAST:  None COMPLICATIONS: None immediate PROCEDURE: Informed consent was obtained from the patient following explanation of the procedure, risks, benefits and alternatives. The patient understands, agrees and consents for the procedure. All questions were addressed. A time out was performed. Maximal barrier sterile technique utilized including caps, mask, sterile gowns, sterile gloves, large sterile drape, hand hygiene, and 2% chlorhexidine scrub. Under sterile conditions and local anesthesia, right internal jugular micropuncture venous access was performed with ultrasound. Images were obtained for documentation. A guide wire was inserted followed by a transitional dilator. Next, a 0.035 guidewire was advanced into the IVC with a 5-French catheter. Measurements were obtained from the right venotomy site to the proximal right atrium. In the right infraclavicular chest, a subcutaneous tunnel was created under sterile conditions and local anesthesia. 1% lidocaine with epinephrine was utilized for this. The 23 cm tip to cuff dialysis catheter was tunneled subcutaneously to the venotomy site and  inserted into the SVC/RA junction through a valved peel-away sheath. Position was confirmed with fluoroscopy. Images were obtained for documentation. Blood was aspirated from the catheter followed by saline and heparin flushes. The appropriate volume and strength of heparin was instilled in each lumen. Caps were applied. The catheter was secured at the tunnel site with Gelfoam and a pursestring suture. The venotomy site was closed with subcuticular Vicryl suture. Dermabond was applied to the small right neck incision. A dry sterile dressing was applied. The catheter is ready for use. No immediate complications. IMPRESSION: Ultrasound and fluoroscopically guided right internal jugular tunneled hemodialysis catheter (23 cm tip to cuff dialysis catheter). Electronically Signed   By: Jerilynn Mages.  Shick M.D.   On: 11/29/2015 12:13   Ir US Guide Vasc Access Right  11/29/2015  CLINICAL DATA:  End-stage renal disease EXAM: ULTRASOUND GUIDANCE FOR VASCULAR ACCESS RIGHT INTERNAL JUGULAR PERMANENT HEMODIALYSIS CATHETER Date:  12/10/201612/09/2015 11:37 am Radiologist:  M. Daryll Brod, MD Guidance:  Ultrasound and fluoroscopic FLUOROSCOPY TIME:  0.8 minutes, 7.1 mGy MEDICATIONS AND MEDICAL HISTORY: 2 g Ancef administered within 1 hour of the procedure, 1% lidocaine locally ANESTHESIA/SEDATION: None. CONTRAST:  None COMPLICATIONS: None immediate PROCEDURE: Informed consent was obtained from the patient following explanation of the procedure, risks, benefits and alternatives. The patient understands, agrees and consents for the procedure. All questions were addressed. A time out was performed. Maximal barrier sterile technique utilized including caps, mask, sterile gowns, sterile gloves, large sterile drape, hand hygiene, and 2% chlorhexidine scrub. Under sterile conditions and local anesthesia, right internal jugular micropuncture venous access was performed with ultrasound. Images were obtained for documentation. A guide wire was  inserted followed by a transitional dilator. Next, a 0.035 guidewire was advanced into the IVC with a 5-French catheter. Measurements were obtained from the right venotomy site to the proximal right atrium. In the right infraclavicular chest, a subcutaneous tunnel was created under sterile conditions and local anesthesia. 1% lidocaine with epinephrine was utilized for this. The 23 cm tip to cuff dialysis catheter was tunneled subcutaneously to the venotomy site and inserted into the SVC/RA junction through a valved peel-away sheath. Position was confirmed with fluoroscopy. Images were obtained for documentation. Blood was aspirated from the catheter followed by saline and heparin flushes. The appropriate volume and strength of heparin was instilled in each lumen. Caps were applied. The catheter was secured at the tunnel site with Gelfoam and a pursestring suture. The venotomy site was closed with subcuticular Vicryl suture. Dermabond was applied to the small  right neck incision. A dry sterile dressing was applied. The catheter is ready for use. No immediate complications. IMPRESSION: Ultrasound and fluoroscopically guided right internal jugular tunneled hemodialysis catheter (23 cm tip to cuff dialysis catheter). Electronically Signed   By: Jerilynn Mages.  Shick M.D.   On: 11/29/2015 12:13   . feeding supplement (NEPRO CARB STEADY)  237 mL Oral Q24H  . feeding supplement (PRO-STAT SUGAR FREE 64)  30 mL Oral BID  . ferric gluconate (FERRLECIT/NULECIT) IV  125 mg Intravenous Once  . insulin aspart  0-20 Units Subcutaneous TID WC  . insulin aspart  0-5 Units Subcutaneous QHS  . metoprolol tartrate  25 mg Oral BID  . nystatin   Topical BID  . ondansetron (ZOFRAN) IV  4 mg Intravenous 4 times per day  . pantoprazole (PROTONIX) IV  40 mg Intravenous Q12H  . sodium chloride  10-40 mL Intracatheter Q12H  . sodium chloride  10-40 mL Intracatheter Q12H  . sodium chloride  3 mL Intravenous Q12H    BMET    Component Value  Date/Time   NA 134* 12/01/2015 0429   K 3.7 12/01/2015 0429   CL 98* 12/01/2015 0429   CO2 27 12/01/2015 0429   GLUCOSE 144* 12/01/2015 0429   BUN 43* 12/01/2015 0429   CREATININE 3.31* 12/01/2015 0429   CALCIUM 8.4* 12/01/2015 0429   CALCIUM 8.5 09/06/2012 1056   GFRNONAA 15* 12/01/2015 0429   GFRAA 18* 12/01/2015 0429   CBC    Component Value Date/Time   WBC 5.0 12/01/2015 0429   RBC 4.21 12/01/2015 0429   HGB 11.3* 12/01/2015 0429   HCT 36.2 12/01/2015 0429   PLT 94* 12/01/2015 0429   MCV 86.0 12/01/2015 0429   MCH 26.8 12/01/2015 0429   MCHC 31.2 12/01/2015 0429   RDW 17.6* 12/01/2015 0429   LYMPHSABS 1.0 12/01/2015 0429   MONOABS 0.5 12/01/2015 0429   EOSABS 0.2 12/01/2015 0429   BASOSABS 0.0 12/01/2015 0429   Background per Dr. Sanda Klein progress note 11/30/15 49 y.o. year-old AAF with a background of [place], chronic lymphedema with massive elephantiasis (moves from bed to chair only), diastolic dysfunction, baseline CKD 3/4 (baseline creatinines available during 2016 appear to have been running 1.6-2.3; felt d/t DM). She was admitted to [facility] on 11/13/15 with RLE cellulitus, worsening edema, and creatinine 1.62 - developed AKI on CKD with associated hyperkalemia. Was diuretic unresponsive and kayexalate intolerant. She received HD on 11/28, 11/29, 12/2, 12/8 via a temporary right femoral catheter placed 11/28. She was transferred 12/9 ostensibly for placement of TDC in IR and was then to be transferred back to [facility]. She received versed and fentanyl for conscious sedation and shortly afterward had a respiratory arrest. Responded to Narcan, romazicon and brief chest compressions awakened after about 2 minutes. She was transferred to the ICU for observation. We were asked to follow and to address any emerging dialysis needs.   Assessment/Plan:  1. AKI/CKD- oliguric/anuric and currently HD-dependent.  Followed by Dr. Lowanda Foster and s/p first HD at [facility] on 11/17/15.  Now with [facility] and  was to transfer back to [facility] until developed GIB.  Will transfer back to [facility] today as Hgb stable and no recurrent bleeding.  Can have HD at [facility].    2. Morbid obesity 3. OHV/hypercarbic respiratory failure- BiPAP qhs 4. Hematemesis- H/H increased to 11.3 without transfusion- ?spurious value.  Plan per PCCM. 5. Chronic diastolic CHF 6. HTN 7. Anemia of chronic disease- s/p IV Iron with HD.   8.  Lymphedema/elephantiasis of lower extremities 9. CKD-MBD- iPTH pending, phos 5.1.  Continue to follow and initiate binders as needed.  10. Vascular access- s/p [facility] 11/29/15, temporary fem catheter removed yesterday. 11. Disposition- transfer back to [facility] today.  Can have HD at [facility] either later today or tomorrow per Dr. Florentina Addison discretion.  Volume and electrolytes stable to hold off on HD.  [place] A

## 2015-12-02 LAB — BLOOD GAS, ARTERIAL
Acid-Base Excess: 4.6 mmol/L — ABNORMAL HIGH (ref 0.0–2.0)
BICARBONATE: 28.2 meq/L — AB (ref 20.0–24.0)
DRAWN BY: 27407
O2 CONTENT: 3 L/min
O2 Saturation: 97.9 %
PCO2 ART: 48.5 mmHg — AB (ref 35.0–45.0)
PO2 ART: 112 mmHg — AB (ref 80.0–100.0)
TCO2: 13.8 mmol/L (ref 0–100)
pH, Arterial: 7.397 (ref 7.350–7.450)

## 2015-12-02 LAB — CBC WITH DIFFERENTIAL/PLATELET
BASOS ABS: 0 10*3/uL (ref 0.0–0.1)
Basophils Relative: 0 %
Eosinophils Absolute: 0.2 10*3/uL (ref 0.0–0.7)
Eosinophils Relative: 3 %
HEMATOCRIT: 28.7 % — AB (ref 36.0–46.0)
Hemoglobin: 9 g/dL — ABNORMAL LOW (ref 12.0–15.0)
LYMPHS ABS: 1.3 10*3/uL (ref 0.7–4.0)
LYMPHS PCT: 22 %
MCH: 27.4 pg (ref 26.0–34.0)
MCHC: 31.4 g/dL (ref 30.0–36.0)
MCV: 87.2 fL (ref 78.0–100.0)
MONOS PCT: 10 %
Monocytes Absolute: 0.6 10*3/uL (ref 0.1–1.0)
Neutro Abs: 4 10*3/uL (ref 1.7–7.7)
Neutrophils Relative %: 65 %
PLATELETS: 205 10*3/uL (ref 150–400)
RBC: 3.29 MIL/uL — AB (ref 3.87–5.11)
RDW: 17.5 % — ABNORMAL HIGH (ref 11.5–15.5)
WBC: 6.1 10*3/uL (ref 4.0–10.5)

## 2015-12-02 LAB — RENAL FUNCTION PANEL
Albumin: 2.5 g/dL — ABNORMAL LOW (ref 3.5–5.0)
Anion gap: 5 (ref 5–15)
BUN: 49 mg/dL — AB (ref 6–20)
CHLORIDE: 98 mmol/L — AB (ref 101–111)
CO2: 31 mmol/L (ref 22–32)
CREATININE: 3.64 mg/dL — AB (ref 0.44–1.00)
Calcium: 8.4 mg/dL — ABNORMAL LOW (ref 8.9–10.3)
GFR calc Af Amer: 16 mL/min — ABNORMAL LOW (ref >60)
GFR calc non Af Amer: 14 mL/min — ABNORMAL LOW (ref >60)
GLUCOSE: 185 mg/dL — AB (ref 65–99)
POTASSIUM: 3.5 mmol/L (ref 3.5–5.1)
Phosphorus: 3.7 mg/dL (ref 2.5–4.6)
Sodium: 134 mmol/L — ABNORMAL LOW (ref 135–145)

## 2015-12-02 LAB — GLUCOSE, CAPILLARY
GLUCOSE-CAPILLARY: 153 mg/dL — AB (ref 65–99)
Glucose-Capillary: 194 mg/dL — ABNORMAL HIGH (ref 65–99)
Glucose-Capillary: 169 mg/dL — ABNORMAL HIGH (ref 65–99)

## 2015-12-02 LAB — MAGNESIUM: Magnesium: 2 mg/dL (ref 1.7–2.4)

## 2015-12-02 MED ORDER — SODIUM CHLORIDE 0.9 % IJ SOLN
INTRAMUSCULAR | Status: AC
  Administered 2015-12-02: 10 mL via INTRAVENOUS
  Filled 2015-12-02: qty 12

## 2015-12-02 MED ORDER — EPOETIN ALFA 10000 UNIT/ML IJ SOLN
10000.0000 [IU] | INTRAMUSCULAR | Status: DC
  Administered 2015-12-02 – 2015-12-04 (×2): 10000 [IU] via INTRAVENOUS
  Filled 2015-12-02 (×2): qty 1

## 2015-12-02 MED ORDER — EPOETIN ALFA 10000 UNIT/ML IJ SOLN
INTRAMUSCULAR | Status: AC
  Administered 2015-12-02: 10000 [IU] via INTRAVENOUS
  Filled 2015-12-02: qty 1

## 2015-12-02 MED ORDER — HEPARIN SODIUM (PORCINE) 1000 UNIT/ML IJ SOLN
INTRAMUSCULAR | Status: AC
  Administered 2015-12-02: 3600 [IU]
  Filled 2015-12-02: qty 6

## 2015-12-02 MED ORDER — POLYETHYLENE GLYCOL 3350 17 G PO PACK: 17.0000 g | PACK | Freq: Every day | ORAL | Status: DC

## 2015-12-02 MED ORDER — SODIUM CHLORIDE 0.9 % IV SOLN: 100.0000 mL | INTRAVENOUS | Status: DC | PRN

## 2015-12-02 MED ORDER — EPOETIN ALFA 10000 UNIT/ML IJ SOLN
10000.0000 [IU] | INTRAMUSCULAR | Status: DC
Start: 2015-12-02 — End: 2015-12-02
  Filled 2015-12-02: qty 1

## 2015-12-02 MED ORDER — POLYETHYLENE GLYCOL 3350 17 G PO PACK
17.0000 g | PACK | Freq: Every day | ORAL | Status: DC
  Administered 2015-12-02 – 2015-12-05 (×3): 17 g via ORAL
  Filled 2015-12-02 (×4): qty 1

## 2015-12-02 NOTE — Procedures (Signed)
HEMODIALYSIS TREATMENT NOTE:  4.5 hour heparin-free dialysis completed via right IJ tunneled catheter. Exit site unremarkable. Pt tolerated treatment in recliner without problems. Goal met: 3.3 liters without interruption in ultrafiltration. All blood was reinfused. Report called to Stann Mainland, RN.  Rockwell Alexandria, RN, CDN

## 2015-12-02 NOTE — Progress Notes (Signed)
Subjective: Patient feeling much better. Presently she doesn't have any nausea or vomiting. She denies also any difficulty breathing.  Objective: Vital signs in last 24 hours: Temp:  [96.6 F (35.9 C)-98.1 F (36.7 C)] 96.6 F (35.9 C) (12/13 0758) Pulse Rate:  [47-85] 85 (12/13 0600) Resp:  [0-21] 21 (12/13 0600) BP: (106-162)/(57-90) 142/90 mmHg (12/13 0600) SpO2:  [96 %-100 %] 96 % (12/13 0600)  Intake/Output from previous day: 12/12 0701 - 12/13 0700 In: 600 [P.O.:600] Out: -  Intake/Output this shift:     Recent Labs  11/30/15 1558 12/01/15 0429 12/01/15 1300 12/02/15 0528  HGB 9.3* 11.3* 9.2* 9.0*    Recent Labs  12/01/15 1300 12/02/15 0528  WBC 6.2 6.1  RBC 3.29* 3.29*  HCT 28.5* 28.7*  PLT 187 205    Recent Labs  12/01/15 0429 12/02/15 0528  NA 134* 134*  K 3.7 3.5  CL 98* 98*  CO2 27 31  BUN 43* 49*  CREATININE 3.31* 3.64*  GLUCOSE 144* 185*  CALCIUM 8.4* 8.4*   No results for input(s): LABPT, INR in the last 72 hours.  Patient alert and in no apparent distress. Chest decreased breath sound. No wheezing. Heart exam regular rate and rhythm Abdomen : obese and nontender. Extremities patient with chronic lymphedema   Assessment/Plan:  Problem #1 renal failure: Presently she doesn't have any uremic signs symptoms. Patient has a tunneled catheter for dialysis.  Problem #2 hyperkalemia: Her potassium remains normal .  Problem #3 diabetes: Her blood sugar is reasonably controlled. Problem #4 morbid obesity: Presently she is bed bound.  Problem #5 chronic renal failure: Stage III Problem #6 anemia: Her hemoglobin is below our target goal. Presently she started on Epogen. Problem #7 cellulitis of her left foot. Patient is on antibiotics: She is a febrile and her white blood cell count is normal. Problem #8 nausea and vomiting. Patient seems to feel much better. Still she has some nausea but no vomiting. Problem #9 status post respiratory  arrest Problem #10 history of sleep apnea: Once EPAP. Plan:1] we'll make arrangements for patient to get dialysis today. 4 and half hours 2] we'll try to get 3 L. Her blood pressure tolerates. 3] we'll give Epogen 10,000 units IV after each dialysis 4]We'll check her basic metabolic,Phosphorus and CBC in the morning     [name] S 12/02/2015, 9:03 AM

## 2015-12-02 NOTE — Progress Notes (Signed)
Gave report to Alma Downs, RN on dept 300. Verbalized understanding.  Pt to be transferred to room 308 after dialysis is complete.

## 2015-12-02 NOTE — Progress Notes (Signed)
Subjective: She was transferred back here from [facility] ([place] yesterday at her request. She has multiple medical problems including obesity hypoventilation requiring BiPAP, acute on chronic renal failure requiring dialysis, morbid obesity, chronic lymphedema with an episode of cellulitis that has improved, diabetes. She is much improved. She is wearing BiPAP at night now.  Objective: Vital signs in last 24 hours: Temp:  [96.6 F (35.9 C)-98.1 F (36.7 C)] 96.6 F (35.9 C) (12/13 0758) Pulse Rate:  [47-85] 85 (12/13 0600) Resp:  [0-21] 21 (12/13 0600) BP: (106-162)/(57-90) 142/90 mmHg (12/13 0600) SpO2:  [92 %-100 %] 96 % (12/13 0600) Weight change:  Last BM Date: 11/30/15  Intake/Output from previous day: 12/12 0701 - 12/13 0700 In: 600 [P.O.:600] Out: -   PHYSICAL EXAM General appearance: alert, cooperative and no distress Resp: clear to auscultation bilaterally Cardio: regular rate and rhythm, S1, S2 normal, no murmur, click, rub or gallop GI: soft, non-tender; bowel sounds normal; no masses,  no organomegaly Extremities: extremities normal, atraumatic, no cyanosis or edema  Lab Results:  Results for orders placed or performed during the hospital encounter of 11/13/15 (from the past 48 hour(s))  Glucose, capillary     Status: Abnormal   Collection Time: 11/30/15 12:13 PM  Result Value Ref Range   Glucose-Capillary 111 (H) 65 - 99 mg/dL  CBC     Status: Abnormal   Collection Time: 11/30/15  3:58 PM  Result Value Ref Range   WBC 6.6 4.0 - 10.5 K/uL   RBC 3.45 (L) 3.87 - 5.11 MIL/uL   Hemoglobin 9.3 (L) 12.0 - 15.0 g/dL   HCT 29.5 (L) 36.0 - 46.0 %   MCV 85.5 78.0 - 100.0 fL   MCH 27.0 26.0 - 34.0 pg   MCHC 31.5 30.0 - 36.0 g/dL   RDW 17.2 (H) 11.5 - 15.5 %   Platelets 207 150 - 400 K/uL  Glucose, capillary     Status: Abnormal   Collection Time: 11/30/15  4:32 PM  Result Value Ref Range   Glucose-Capillary 138 (H) 65 - 99 mg/dL  Glucose, capillary     Status: Abnormal    Collection Time: 11/30/15  9:56 PM  Result Value Ref Range   Glucose-Capillary 159 (H) 65 - 99 mg/dL   Comment 1 Notify RN   Basic metabolic panel     Status: Abnormal   Collection Time: 12/01/15  4:29 AM  Result Value Ref Range   Sodium 134 (L) 135 - 145 mmol/L   Potassium 3.7 3.5 - 5.1 mmol/L   Chloride 98 (L) 101 - 111 mmol/L   CO2 27 22 - 32 mmol/L   Glucose, Bld 144 (H) 65 - 99 mg/dL   BUN 43 (H) 6 - 20 mg/dL   Creatinine, Ser 3.31 (H) 0.44 - 1.00 mg/dL   Calcium 8.4 (L) 8.9 - 10.3 mg/dL   GFR calc non Af Amer 15 (L) >60 mL/min   GFR calc Af Amer 18 (L) >60 mL/min    Comment: (NOTE) The eGFR has been calculated using the CKD EPI equation. This calculation has not been validated in all clinical situations. eGFR's persistently <60 mL/min signify possible Chronic Kidney Disease.    Anion gap 9 5 - 15  CBC with Differential/Platelet     Status: Abnormal   Collection Time: 12/01/15  4:29 AM  Result Value Ref Range   WBC 5.0 4.0 - 10.5 K/uL    Comment: WHITE COUNT CONFIRMED ON SMEAR   RBC 4.21 3.87 - 5.11 MIL/uL  Hemoglobin 11.3 (L) 12.0 - 15.0 g/dL   HCT 36.2 36.0 - 46.0 %   MCV 86.0 78.0 - 100.0 fL   MCH 26.8 26.0 - 34.0 pg   MCHC 31.2 30.0 - 36.0 g/dL   RDW 17.6 (H) 11.5 - 15.5 %   Platelets 94 (L) 150 - 400 K/uL    Comment: DELTA CHECK NOTED SPECIMEN CHECKED FOR CLOTS PLATELET COUNT CONFIRMED BY SMEAR    Neutrophils Relative % 68 %   Lymphocytes Relative 19 %   Monocytes Relative 10 %   Eosinophils Relative 3 %   Basophils Relative 0 %   Neutro Abs 3.3 1.7 - 7.7 K/uL   Lymphs Abs 1.0 0.7 - 4.0 K/uL   Monocytes Absolute 0.5 0.1 - 1.0 K/uL   Eosinophils Absolute 0.2 0.0 - 0.7 K/uL   Basophils Absolute 0.0 0.0 - 0.1 K/uL   RBC Morphology TARGET CELLS   Glucose, capillary     Status: Abnormal   Collection Time: 12/01/15  7:59 AM  Result Value Ref Range   Glucose-Capillary 143 (H) 65 - 99 mg/dL  Glucose, capillary     Status: Abnormal   Collection Time:  12/01/15 11:56 AM  Result Value Ref Range   Glucose-Capillary 184 (H) 65 - 99 mg/dL  CBC with Differential/Platelet     Status: Abnormal   Collection Time: 12/01/15  1:00 PM  Result Value Ref Range   WBC 6.2 4.0 - 10.5 K/uL   RBC 3.29 (L) 3.87 - 5.11 MIL/uL   Hemoglobin 9.2 (L) 12.0 - 15.0 g/dL   HCT 28.5 (L) 36.0 - 46.0 %   MCV 86.6 78.0 - 100.0 fL   MCH 28.0 26.0 - 34.0 pg   MCHC 32.3 30.0 - 36.0 g/dL   RDW 17.3 (H) 11.5 - 15.5 %   Platelets 187 150 - 400 K/uL    Comment: REPEATED TO VERIFY   Neutrophils Relative % 72 %   Neutro Abs 4.4 1.7 - 7.7 K/uL   Lymphocytes Relative 19 %   Lymphs Abs 1.1 0.7 - 4.0 K/uL   Monocytes Relative 7 %   Monocytes Absolute 0.4 0.1 - 1.0 K/uL   Eosinophils Relative 2 %   Eosinophils Absolute 0.2 0.0 - 0.7 K/uL   Basophils Relative 0 %   Basophils Absolute 0.0 0.0 - 0.1 K/uL  Glucose, capillary     Status: Abnormal   Collection Time: 12/01/15  5:58 PM  Result Value Ref Range   Glucose-Capillary 164 (H) 65 - 99 mg/dL   Comment 1 Document in Chart   Glucose, capillary     Status: Abnormal   Collection Time: 12/01/15  9:49 PM  Result Value Ref Range   Glucose-Capillary 182 (H) 65 - 99 mg/dL  Renal function panel     Status: Abnormal   Collection Time: 12/02/15  5:28 AM  Result Value Ref Range   Sodium 134 (L) 135 - 145 mmol/L   Potassium 3.5 3.5 - 5.1 mmol/L   Chloride 98 (L) 101 - 111 mmol/L   CO2 31 22 - 32 mmol/L   Glucose, Bld 185 (H) 65 - 99 mg/dL   BUN 49 (H) 6 - 20 mg/dL   Creatinine, Ser 3.64 (H) 0.44 - 1.00 mg/dL   Calcium 8.4 (L) 8.9 - 10.3 mg/dL   Phosphorus 3.7 2.5 - 4.6 mg/dL   Albumin 2.5 (L) 3.5 - 5.0 g/dL   GFR calc non Af Amer 14 (L) >60 mL/min   GFR calc Af Wyvonnia Lora  16 (L) >60 mL/min    Comment: (NOTE) The eGFR has been calculated using the CKD EPI equation. This calculation has not been validated in all clinical situations. eGFR's persistently <60 mL/min signify possible Chronic Kidney Disease.    Anion gap 5 5 - 15   CBC with Differential/Platelet     Status: Abnormal   Collection Time: 12/02/15  5:28 AM  Result Value Ref Range   WBC 6.1 4.0 - 10.5 K/uL   RBC 3.29 (L) 3.87 - 5.11 MIL/uL   Hemoglobin 9.0 (L) 12.0 - 15.0 g/dL   HCT 28.7 (L) 36.0 - 46.0 %   MCV 87.2 78.0 - 100.0 fL   MCH 27.4 26.0 - 34.0 pg   MCHC 31.4 30.0 - 36.0 g/dL   RDW 17.5 (H) 11.5 - 15.5 %   Platelets 205 150 - 400 K/uL   Neutrophils Relative % 65 %   Neutro Abs 4.0 1.7 - 7.7 K/uL   Lymphocytes Relative 22 %   Lymphs Abs 1.3 0.7 - 4.0 K/uL   Monocytes Relative 10 %   Monocytes Absolute 0.6 0.1 - 1.0 K/uL   Eosinophils Relative 3 %   Eosinophils Absolute 0.2 0.0 - 0.7 K/uL   Basophils Relative 0 %   Basophils Absolute 0.0 0.0 - 0.1 K/uL  Magnesium     Status: None   Collection Time: 12/02/15  5:28 AM  Result Value Ref Range   Magnesium 2.0 1.7 - 2.4 mg/dL  Glucose, capillary     Status: Abnormal   Collection Time: 12/02/15  7:45 AM  Result Value Ref Range   Glucose-Capillary 153 (H) 65 - 99 mg/dL   Comment 1 Document in Chart     ABGS No results for input(s): PHART, PO2ART, TCO2, HCO3 in the last 72 hours.  Invalid input(s): PCO2 CULTURES Recent Results (from the past 240 hour(s))  MRSA PCR Screening     Status: None   Collection Time: 11/28/15  2:15 PM  Result Value Ref Range Status   MRSA by PCR NEGATIVE NEGATIVE Final    Comment:        The GeneXpert MRSA Assay (FDA approved for NASAL specimens only), is one component of a comprehensive MRSA colonization surveillance program. It is not intended to diagnose MRSA infection nor to guide or monitor treatment for MRSA infections.    Studies/Results: No results found.  Medications:  Prior to Admission:  Prescriptions prior to admission  Medication Sig Dispense Refill Last Dose  . ALPRAZolam (XANAX) 0.5 MG tablet Take 0.5 mg by mouth 3 (three) times daily as needed for anxiety or sleep.    11/12/2015 at Unknown time  . amLODipine (NORVASC) 10 MG  tablet Take 10 mg by mouth daily.   11/12/2015 at Unknown time  . insulin NPH-insulin regular (NOVOLIN 70/30) (70-30) 100 UNIT/ML injection Inject 60 Units into the skin 2 (two) times daily.   11/12/2015 at Unknown time  . metoprolol (LOPRESSOR) 50 MG tablet Take 50 mg by mouth 2 (two) times daily.   11/12/2015 at Unknown time  . Oxycodone HCl 10 MG TABS Take 10 mg by mouth 4 (four) times daily as needed (pain).    Past Week at Unknown time  . potassium chloride SA (K-DUR,KLOR-CON) 20 MEQ tablet Take 20 mEq by mouth 3 (three) times daily.   11/12/2015 at Unknown time  . torsemide (DEMADEX) 100 MG tablet Take 100 mg by mouth 2 (two) times daily.    11/12/2015 at Unknown time  . acetaminophen (  TYLENOL) 325 MG tablet Take 650 mg by mouth every 6 (six) hours as needed. fever   unknown  . diphenhydrAMINE (BENADRYL) 25 mg capsule Take 25 mg by mouth every 6 (six) hours as needed for allergies.   unknown  . docusate sodium (COLACE) 100 MG capsule Take 100 mg by mouth 2 (two) times daily as needed. For constipation   unknown  . HYDROcodone-acetaminophen (NORCO/VICODIN) 5-325 MG per tablet Take 2 tablets by mouth every 4 (four) hours as needed. (Patient not taking: Reported on 12/02/2014) 10 tablet 0   . oseltamivir (TAMIFLU) 75 MG capsule Take 1 capsule (75 mg total) by mouth every 12 (twelve) hours. (Patient not taking: Reported on 12/02/2014) 10 capsule 0   . oxyCODONE-acetaminophen (PERCOCET/ROXICET) 5-325 MG per tablet Take 1 tablet by mouth every 6 (six) hours as needed for severe pain. 15 tablet 0 unknown   Scheduled: . feeding supplement (NEPRO CARB STEADY)  237 mL Oral Q24H  . feeding supplement (PRO-STAT SUGAR FREE 64)  30 mL Oral BID  . ferric gluconate (FERRLECIT/NULECIT) IV  125 mg Intravenous Once  . insulin aspart  0-20 Units Subcutaneous TID WC  . insulin aspart  0-5 Units Subcutaneous QHS  . metoprolol tartrate  25 mg Oral BID  . nystatin   Topical BID  . ondansetron (ZOFRAN) IV  4 mg  Intravenous 4 times per day  . pantoprazole (PROTONIX) IV  40 mg Intravenous Q12H  . sodium chloride  10-40 mL Intracatheter Q12H  . sodium chloride  10-40 mL Intracatheter Q12H  . sodium chloride  3 mL Intravenous Q12H   Continuous:  YPG:ATAOOL chloride, sodium chloride, alteplase, alteplase, ondansetron (ZOFRAN) IV, oxyCODONE, promethazine, sodium chloride, sodium chloride, sodium chloride, sodium chloride  Assesment: She was admitted with cellulitis of the right leg. This was treated and has resolved. She developed increasing problems with renal failure and now is going to require dialysis. She went to get a dialysis catheter placed and had respiratory arrest after receiving sedation. At baseline she has acute on chronic respiratory failure and obesity hypoventilation syndrome. She has severe protein calorie malnutrition. Principal Problem:   Cellulitis of leg without foot, right Active Problems:   Lymphedema of lower extremity   Diabetes mellitus (HCC)   Morbid obesity (HCC)   Hyperkalemia   Obesity hypoventilation syndrome (HCC)   Chronic diastolic CHF (congestive heart failure) (HCC)   Chronic respiratory failure with hypoxia (HCC)   CKD (chronic kidney disease), stage III   Anemia of chronic disease   Acute on chronic respiratory failure (HCC)   Respiratory arrest (HCC)   Protein-calorie malnutrition, severe    Plan: I think she can move from the ICU after she has dialysis today. Potential discharge in the next 48 hours    LOS: 19 days   Berenise Hunton L 12/02/2015, 8:43 AM

## 2015-12-02 NOTE — Clinical Social Work Note (Addendum)
CSW notified pt of outpatient dialysis schedule (Tuesday, Thursday, Saturday at 11:25). Pt is aware and agreeable and will work on transportation for Thursday in anticipation of d/c. She states her family is preparing the home today for hospital bed and lift. CSW notified guest services at [facility] of possible outpatient dialysis starting Thursday. Discussed with RN need for pt use lift for transfer to chair as pt will need to be in chair for outpatient dialysis.   Benay Pike, [place]

## 2015-12-02 NOTE — Progress Notes (Signed)
BIPAP removed at 0600. Pt wore machine from 2250 until I removed this am. Pt states she feels ok and rested well. Very proud of her effects, cooperation and compliance with order.

## 2015-12-03 LAB — CBC
HEMATOCRIT: 31 % — AB (ref 36.0–46.0)
HEMOGLOBIN: 9.9 g/dL — AB (ref 12.0–15.0)
MCH: 28.2 pg (ref 26.0–34.0)
MCHC: 31.9 g/dL (ref 30.0–36.0)
MCV: 88.3 fL (ref 78.0–100.0)
Platelets: 222 10*3/uL (ref 150–400)
RBC: 3.51 MIL/uL — ABNORMAL LOW (ref 3.87–5.11)
RDW: 17.5 % — ABNORMAL HIGH (ref 11.5–15.5)
WBC: 7.3 10*3/uL (ref 4.0–10.5)

## 2015-12-03 LAB — GLUCOSE, CAPILLARY
GLUCOSE-CAPILLARY: 154 mg/dL — AB (ref 65–99)
GLUCOSE-CAPILLARY: 150 mg/dL — AB (ref 65–99)
Glucose-Capillary: 160 mg/dL — ABNORMAL HIGH (ref 65–99)
Glucose-Capillary: 158 mg/dL — ABNORMAL HIGH (ref 65–99)

## 2015-12-03 LAB — BLOOD GAS, ARTERIAL
Acid-Base Excess: 5.1 mmol/L — ABNORMAL HIGH (ref 0.0–2.0)
Bicarbonate: 28.5 mEq/L — ABNORMAL HIGH (ref 20.0–24.0)
Drawn by: 317771
O2 Content: 4 L/min
O2 SAT: 96.3 %
PCO2 ART: 54.1 mmHg — AB (ref 35.0–45.0)
PO2 ART: 89.6 mmHg (ref 80.0–100.0)
TCO2: 12.4 mmol/L (ref 0–100)
pH, Arterial: 7.366 (ref 7.350–7.450)

## 2015-12-03 LAB — BASIC METABOLIC PANEL
ANION GAP: 3 — AB (ref 5–15)
BUN: 32 mg/dL — ABNORMAL HIGH (ref 6–20)
CHLORIDE: 101 mmol/L (ref 101–111)
CO2: 32 mmol/L (ref 22–32)
Calcium: 8.2 mg/dL — ABNORMAL LOW (ref 8.9–10.3)
Creatinine, Ser: 2.66 mg/dL — ABNORMAL HIGH (ref 0.44–1.00)
GFR calc Af Amer: 23 mL/min — ABNORMAL LOW (ref >60)
GFR calc non Af Amer: 20 mL/min — ABNORMAL LOW (ref >60)
GLUCOSE: 162 mg/dL — AB (ref 65–99)
POTASSIUM: 3.7 mmol/L (ref 3.5–5.1)
Sodium: 136 mmol/L (ref 135–145)

## 2015-12-03 LAB — PHOSPHORUS: Phosphorus: 2.8 mg/dL (ref 2.5–4.6)

## 2015-12-03 MED ORDER — NYSTATIN 100000 UNIT/GM EX POWD
CUTANEOUS | Status: AC
  Filled 2015-12-03: qty 15

## 2015-12-03 NOTE — Progress Notes (Signed)
Subjective: She was transferred from the ICU yesterday and is in a regular room now with telemetry. She had dialysis yesterday she has no new complaints except she is sleepy because of the bed change  Objective: Vital signs in last 24 hours: Temp:  [97.7 F (36.5 C)-98.4 F (36.9 C)] 98.4 F (36.9 C) (12/14 0507) Pulse Rate:  [53-76] 76 (12/14 0755) Resp:  [15-20] 18 (12/14 0755) BP: (131-176)/(59-104) 131/61 mmHg (12/14 0507) SpO2:  [96 %-100 %] 96 % (12/14 0755) Weight:  [190.057 kg (419 lb)] 190.057 kg (419 lb) (12/14 0507) Weight change:  Last BM Date: 11/30/15  Intake/Output from previous day: 12/13 0701 - 12/14 0700 In: -  Out: 4250 [Urine:950]  PHYSICAL EXAM General appearance: alert, cooperative, mild distress and morbidly obese Resp: diminished breath sounds bilaterally Cardio: regular rate and rhythm, S1, S2 normal, no murmur, click, rub or gallop GI: soft, non-tender; bowel sounds normal; no masses,  no organomegaly Extremities: Significant lymphedema  Lab Results:  Results for orders placed or performed during the hospital encounter of 11/13/15 (from the past 48 hour(s))  Glucose, capillary     Status: Abnormal   Collection Time: 12/01/15 11:56 AM  Result Value Ref Range   Glucose-Capillary 184 (H) 65 - 99 mg/dL  CBC with Differential/Platelet     Status: Abnormal   Collection Time: 12/01/15  1:00 PM  Result Value Ref Range   WBC 6.2 4.0 - 10.5 K/uL   RBC 3.29 (L) 3.87 - 5.11 MIL/uL   Hemoglobin 9.2 (L) 12.0 - 15.0 g/dL   HCT 28.5 (L) 36.0 - 46.0 %   MCV 86.6 78.0 - 100.0 fL   MCH 28.0 26.0 - 34.0 pg   MCHC 32.3 30.0 - 36.0 g/dL   RDW 17.3 (H) 11.5 - 15.5 %   Platelets 187 150 - 400 K/uL    Comment: REPEATED TO VERIFY   Neutrophils Relative % 72 %   Neutro Abs 4.4 1.7 - 7.7 K/uL   Lymphocytes Relative 19 %   Lymphs Abs 1.1 0.7 - 4.0 K/uL   Monocytes Relative 7 %   Monocytes Absolute 0.4 0.1 - 1.0 K/uL   Eosinophils Relative 2 %   Eosinophils  Absolute 0.2 0.0 - 0.7 K/uL   Basophils Relative 0 %   Basophils Absolute 0.0 0.0 - 0.1 K/uL  Glucose, capillary     Status: Abnormal   Collection Time: 12/01/15  5:58 PM  Result Value Ref Range   Glucose-Capillary 164 (H) 65 - 99 mg/dL   Comment 1 Document in Chart   Glucose, capillary     Status: Abnormal   Collection Time: 12/01/15  9:49 PM  Result Value Ref Range   Glucose-Capillary 182 (H) 65 - 99 mg/dL  Renal function panel     Status: Abnormal   Collection Time: 12/02/15  5:28 AM  Result Value Ref Range   Sodium 134 (L) 135 - 145 mmol/L   Potassium 3.5 3.5 - 5.1 mmol/L   Chloride 98 (L) 101 - 111 mmol/L   CO2 31 22 - 32 mmol/L   Glucose, Bld 185 (H) 65 - 99 mg/dL   BUN 49 (H) 6 - 20 mg/dL   Creatinine, Ser 3.64 (H) 0.44 - 1.00 mg/dL   Calcium 8.4 (L) 8.9 - 10.3 mg/dL   Phosphorus 3.7 2.5 - 4.6 mg/dL   Albumin 2.5 (L) 3.5 - 5.0 g/dL   GFR calc non Af Amer 14 (L) >60 mL/min   GFR calc Af Amer 16 (  L) >60 mL/min    Comment: (NOTE) The eGFR has been calculated using the CKD EPI equation. This calculation has not been validated in all clinical situations. eGFR's persistently <60 mL/min signify possible Chronic Kidney Disease.    Anion gap 5 5 - 15  CBC with Differential/Platelet     Status: Abnormal   Collection Time: 12/02/15  5:28 AM  Result Value Ref Range   WBC 6.1 4.0 - 10.5 K/uL   RBC 3.29 (L) 3.87 - 5.11 MIL/uL   Hemoglobin 9.0 (L) 12.0 - 15.0 g/dL   HCT 28.7 (L) 36.0 - 46.0 %   MCV 87.2 78.0 - 100.0 fL   MCH 27.4 26.0 - 34.0 pg   MCHC 31.4 30.0 - 36.0 g/dL   RDW 17.5 (H) 11.5 - 15.5 %   Platelets 205 150 - 400 K/uL   Neutrophils Relative % 65 %   Neutro Abs 4.0 1.7 - 7.7 K/uL   Lymphocytes Relative 22 %   Lymphs Abs 1.3 0.7 - 4.0 K/uL   Monocytes Relative 10 %   Monocytes Absolute 0.6 0.1 - 1.0 K/uL   Eosinophils Relative 3 %   Eosinophils Absolute 0.2 0.0 - 0.7 K/uL   Basophils Relative 0 %   Basophils Absolute 0.0 0.0 - 0.1 K/uL  Magnesium      Status: None   Collection Time: 12/02/15  5:28 AM  Result Value Ref Range   Magnesium 2.0 1.7 - 2.4 mg/dL  Glucose, capillary     Status: Abnormal   Collection Time: 12/02/15  7:45 AM  Result Value Ref Range   Glucose-Capillary 153 (H) 65 - 99 mg/dL   Comment 1 Document in Chart   Glucose, capillary     Status: Abnormal   Collection Time: 12/02/15 11:30 AM  Result Value Ref Range   Glucose-Capillary 194 (H) 65 - 99 mg/dL   Comment 1 Document in Chart   Blood gas, arterial     Status: Abnormal   Collection Time: 12/02/15  5:10 PM  Result Value Ref Range   O2 Content 3.0 L/min   Delivery systems NASAL CANNULA    pH, Arterial 7.397 7.350 - 7.450   pCO2 arterial 48.5 (H) 35.0 - 45.0 mmHg   pO2, Arterial 112 (H) 80.0 - 100.0 mmHg   Bicarbonate 28.2 (H) 20.0 - 24.0 mEq/L   TCO2 13.8 0 - 100 mmol/L   Acid-Base Excess 4.6 (H) 0.0 - 2.0 mmol/L   O2 Saturation 97.9 %   Collection site LEFT RADIAL    Drawn by [id number]    Sample type ARTERIAL DRAW    Allens test (pass/fail) PASS PASS  Glucose, capillary     Status: Abnormal   Collection Time: 12/02/15  8:41 PM  Result Value Ref Range   Glucose-Capillary 169 (H) 65 - 99 mg/dL  Blood gas, arterial     Status: Abnormal   Collection Time: 12/03/15  4:20 AM  Result Value Ref Range   O2 Content 4.0 L/min   Delivery systems NASAL CANNULA    pH, Arterial 7.366 7.350 - 7.450   pCO2 arterial 54.1 (H) 35.0 - 45.0 mmHg   pO2, Arterial 89.6 80.0 - 100.0 mmHg   Bicarbonate 28.5 (H) 20.0 - 24.0 mEq/L   TCO2 12.4 0 - 100 mmol/L   Acid-Base Excess 5.1 (H) 0.0 - 2.0 mmol/L   O2 Saturation 96.3 %   Collection site RIGHT RADIAL    Drawn by [id number]    Sample type ARTERIAL DRAW  Allens test (pass/fail) PASS PASS  Basic metabolic panel     Status: Abnormal   Collection Time: 12/03/15  6:31 AM  Result Value Ref Range   Sodium 136 135 - 145 mmol/L   Potassium 3.7 3.5 - 5.1 mmol/L   Chloride 101 101 - 111 mmol/L   CO2 32 22 - 32 mmol/L   Glucose,  Bld 162 (H) 65 - 99 mg/dL   BUN 32 (H) 6 - 20 mg/dL   Creatinine, Ser 2.66 (H) 0.44 - 1.00 mg/dL   Calcium 8.2 (L) 8.9 - 10.3 mg/dL   GFR calc non Af Amer 20 (L) >60 mL/min   GFR calc Af Amer 23 (L) >60 mL/min    Comment: (NOTE) The eGFR has been calculated using the CKD EPI equation. This calculation has not been validated in all clinical situations. eGFR's persistently <60 mL/min signify possible Chronic Kidney Disease.    Anion gap 3 (L) 5 - 15  CBC     Status: Abnormal   Collection Time: 12/03/15  6:31 AM  Result Value Ref Range   WBC 7.3 4.0 - 10.5 K/uL   RBC 3.51 (L) 3.87 - 5.11 MIL/uL   Hemoglobin 9.9 (L) 12.0 - 15.0 g/dL   HCT 31.0 (L) 36.0 - 46.0 %   MCV 88.3 78.0 - 100.0 fL   MCH 28.2 26.0 - 34.0 pg   MCHC 31.9 30.0 - 36.0 g/dL   RDW 17.5 (H) 11.5 - 15.5 %   Platelets 222 150 - 400 K/uL  Phosphorus     Status: None   Collection Time: 12/03/15  6:31 AM  Result Value Ref Range   Phosphorus 2.8 2.5 - 4.6 mg/dL  Glucose, capillary     Status: Abnormal   Collection Time: 12/03/15  7:30 AM  Result Value Ref Range   Glucose-Capillary 154 (H) 65 - 99 mg/dL   Comment 1 Notify RN    Comment 2 Document in Chart     ABGS  Recent Labs  12/03/15 0420  PHART 7.366  PO2ART 89.6  TCO2 12.4  HCO3 28.5*   CULTURES Recent Results (from the past 240 hour(s))  MRSA PCR Screening     Status: None   Collection Time: 11/28/15  2:15 PM  Result Value Ref Range Status   MRSA by PCR NEGATIVE NEGATIVE Final    Comment:        The GeneXpert MRSA Assay (FDA approved for NASAL specimens only), is one component of a comprehensive MRSA colonization surveillance program. It is not intended to diagnose MRSA infection nor to guide or monitor treatment for MRSA infections.    Studies/Results: No results found.  Medications:  Prior to Admission:  Prescriptions prior to admission  Medication Sig Dispense Refill Last Dose  . ALPRAZolam (XANAX) 0.5 MG tablet Take 0.5 mg by  mouth 3 (three) times daily as needed for anxiety or sleep.    11/12/2015 at Unknown time  . amLODipine (NORVASC) 10 MG tablet Take 10 mg by mouth daily.   11/12/2015 at Unknown time  . insulin NPH-insulin regular (NOVOLIN 70/30) (70-30) 100 UNIT/ML injection Inject 60 Units into the skin 2 (two) times daily.   11/12/2015 at Unknown time  . metoprolol (LOPRESSOR) 50 MG tablet Take 50 mg by mouth 2 (two) times daily.   11/12/2015 at Unknown time  . Oxycodone HCl 10 MG TABS Take 10 mg by mouth 4 (four) times daily as needed (pain).    Past Week at Unknown time  .  potassium chloride SA (K-DUR,KLOR-CON) 20 MEQ tablet Take 20 mEq by mouth 3 (three) times daily.   11/12/2015 at Unknown time  . torsemide (DEMADEX) 100 MG tablet Take 100 mg by mouth 2 (two) times daily.    11/12/2015 at Unknown time  . acetaminophen (TYLENOL) 325 MG tablet Take 650 mg by mouth every 6 (six) hours as needed. fever   unknown  . diphenhydrAMINE (BENADRYL) 25 mg capsule Take 25 mg by mouth every 6 (six) hours as needed for allergies.   unknown  . docusate sodium (COLACE) 100 MG capsule Take 100 mg by mouth 2 (two) times daily as needed. For constipation   unknown  . HYDROcodone-acetaminophen (NORCO/VICODIN) 5-325 MG per tablet Take 2 tablets by mouth every 4 (four) hours as needed. (Patient not taking: Reported on 12/02/2014) 10 tablet 0   . oseltamivir (TAMIFLU) 75 MG capsule Take 1 capsule (75 mg total) by mouth every 12 (twelve) hours. (Patient not taking: Reported on 12/02/2014) 10 capsule 0   . oxyCODONE-acetaminophen (PERCOCET/ROXICET) 5-325 MG per tablet Take 1 tablet by mouth every 6 (six) hours as needed for severe pain. 15 tablet 0 unknown   Scheduled: . epoetin (EPOGEN/PROCRIT) injection  10,000 Units Intravenous Q T,Th,Sa-HD  . feeding supplement (NEPRO CARB STEADY)  237 mL Oral Q24H  . feeding supplement (PRO-STAT SUGAR FREE 64)  30 mL Oral BID  . ferric gluconate (FERRLECIT/NULECIT) IV  125 mg Intravenous Once   . insulin aspart  0-20 Units Subcutaneous TID WC  . insulin aspart  0-5 Units Subcutaneous QHS  . metoprolol tartrate  25 mg Oral BID  . nystatin   Topical BID  . ondansetron (ZOFRAN) IV  4 mg Intravenous 4 times per day  . pantoprazole (PROTONIX) IV  40 mg Intravenous Q12H  . polyethylene glycol  17 g Oral Daily  . sodium chloride  10-40 mL Intracatheter Q12H  . sodium chloride  10-40 mL Intracatheter Q12H  . sodium chloride  3 mL Intravenous Q12H   Continuous:  LPF:XTKWIO chloride, sodium chloride, sodium chloride, sodium chloride, alteplase, alteplase, ondansetron (ZOFRAN) IV, oxyCODONE, promethazine, sodium chloride, sodium chloride, sodium chloride, sodium chloride  Assesment: She was admitted with cellulitis of the foot. This is better. This is related to her chronic lymphedema. She has morbid obesity with obesity hypoventilation syndrome and were trying to get her qualified for a positive pressure device at home. She has chronic hypoxic respiratory failure and that's about the same. She has chronic kidney disease and is now going to require dialysis. She has diabetes which is doing pretty well. She had a respiratory arrest associated with her obesity hypoventilation and the use of sedatives to allow her to have permacath placement Principal Problem:   Cellulitis of leg without foot, right Active Problems:   Lymphedema of lower extremity   Diabetes mellitus (HCC)   Morbid obesity (HCC)   Hyperkalemia   Obesity hypoventilation syndrome (HCC)   Chronic diastolic CHF (congestive heart failure) (HCC)   Chronic respiratory failure with hypoxia (HCC)   CKD (chronic kidney disease), stage III   Anemia of chronic disease   Acute on chronic respiratory failure (HCC)   Respiratory arrest (HCC)   Protein-calorie malnutrition, severe    Plan: Continue current treatments. Potential discharge tomorrow after dialysis    LOS: 20 days   Kashmere Staffa L 12/03/2015, 8:54 AM

## 2015-12-03 NOTE — Progress Notes (Signed)
[name]  MRN: [id number]  DOB/AGE: 49-03-1966 49 y.o.  Primary Care Physician:HAWKINS,EDWARD L, MD  Admit date: 11/13/2015  Chief Complaint:  Chief Complaint  Patient presents with  . Leg Pain    S-Pt presented on  11/13/2015 with  Chief Complaint  Patient presents with  . Leg Pain  .    Pt offers no new concerns.Pt says " I am feeling so much better"  meds . epoetin (EPOGEN/PROCRIT) injection  10,000 Units Intravenous Q T,Th,Sa-HD  . feeding supplement (NEPRO CARB STEADY)  237 mL Oral Q24H  . feeding supplement (PRO-STAT SUGAR FREE 64)  30 mL Oral BID  . ferric gluconate (FERRLECIT/NULECIT) IV  125 mg Intravenous Once  . insulin aspart  0-20 Units Subcutaneous TID WC  . insulin aspart  0-5 Units Subcutaneous QHS  . metoprolol tartrate  25 mg Oral BID  . nystatin   Topical BID  . ondansetron (ZOFRAN) IV  4 mg Intravenous 4 times per day  . pantoprazole (PROTONIX) IV  40 mg Intravenous Q12H  . polyethylene glycol  17 g Oral Daily  . sodium chloride  10-40 mL Intracatheter Q12H  . sodium chloride  10-40 mL Intracatheter Q12H  . sodium chloride  3 mL Intravenous Q12H     Physical Exam: Vital signs in last 24 hours: Temp:  [97.7 F (36.5 C)-98.4 F (36.9 C)] 98.4 F (36.9 C) (12/14 0507) Pulse Rate:  [53-76] 76 (12/14 0755) Resp:  [15-20] 18 (12/14 0755) BP: (131-176)/(59-104) 131/61 mmHg (12/14 0507) SpO2:  [96 %-100 %] 96 % (12/14 0755) Weight:  [419 lb (190.057 kg)] 419 lb (190.057 kg) (12/14 0507) Weight change:  Last BM Date: 11/30/15  Intake/Output from previous day: 12/13 0701 - 12/14 0700 In: -  Out: 4250 [Urine:950]     Physical Exam: General- pt is awake,alert, following commands. Resp- No acute REsp distress, decreased Bs at bases CVS- S1S2 regular in rate and rhythm GIT- BS+, soft, NT, Morbidly obese EXT- Chronic  LE lympheedema, NO Cyanosis, chronic venous stasis changes Access- Tunelled cath in situ  Lab Results: CBC     Component Value Date/Time   WBC 7.3 12/03/2015 0631   RBC 3.51* 12/03/2015 0631   HGB 9.9* 12/03/2015 0631   HCT 31.0* 12/03/2015 0631   PLT 222 12/03/2015 0631   MCV 88.3 12/03/2015 0631   MCH 28.2 12/03/2015 0631   MCHC 31.9 12/03/2015 0631   RDW 17.5* 12/03/2015 0631   LYMPHSABS 1.3 12/02/2015 0528   MONOABS 0.6 12/02/2015 0528   EOSABS 0.2 12/02/2015 0528   BASOSABS 0.0 12/02/2015 0528       BMET  Recent Labs  12/02/15 0528 12/03/15 0631  NA 134* 136  K 3.5 3.7  CL 98* 101  CO2 31 32  GLUCOSE 185* 162*  BUN 49* 32*  CREATININE 3.64* 2.66*  CALCIUM 8.4* 8.2*     MICRO Recent Results (from the past 240 hour(s))  MRSA PCR Screening     Status: None   Collection Time: 11/28/15  2:15 PM  Result Value Ref Range Status   MRSA by PCR NEGATIVE NEGATIVE Final    Comment:        The GeneXpert MRSA Assay (FDA approved for NASAL specimens only), is one component of a comprehensive MRSA colonization surveillance program. It is not intended to diagnose MRSA infection nor to guide or monitor treatment for MRSA infections.       Lab Results  Component Value Date   PTH 127* 11/29/2015  CALCIUM 8.2* 12/03/2015   PHOS 2.8 12/03/2015               Impression: 1)Renal  AKI secondary to ATN               AKI on CKD               CKD stage 3  .               CKD since 2012               CKD secondary to DM/HTN/ Obesity related glomerulopathy                Progression of CKD marked with AKI                Pt initiated on renal replacement therapy on 11/17/15                Pt  HD was held from 11/21/15 to 11/26/15                BUt Pt urine output becameand Creat higher                Pt now on HD the patient last dialyzed yesterday          2)HTN  Medication- On Beta blockers   3)Anemia HGb stable  4)CKD Mineral-Bone Disorder PTH-High Secondary Hyperparathyroidism Present. Phosphorus now at goal. Calcium when corrected for low albumin  is  at goal.  5)ID-admitted with cellulitis PMD following  6)Electrolytes Hyperkalemic    Started on  Lasix    GIven Kayexalate     Later was on renal replacment therapy      Now better  Hyponatremic   stable   7)Acid base Co2 at goal     Plan:  Will continue current care Will dialyze in am      Tishawna Larouche S 12/03/2015, 9:00 AM

## 2015-12-04 LAB — RENAL FUNCTION PANEL
ALBUMIN: 2.7 g/dL — AB (ref 3.5–5.0)
ANION GAP: 10 (ref 5–15)
BUN: 37 mg/dL — ABNORMAL HIGH (ref 6–20)
CALCIUM: 8.5 mg/dL — AB (ref 8.9–10.3)
CO2: 29 mmol/L (ref 22–32)
CREATININE: 4.09 mg/dL — AB (ref 0.44–1.00)
Chloride: 95 mmol/L — ABNORMAL LOW (ref 101–111)
GFR, EST AFRICAN AMERICAN: 14 mL/min — AB (ref >60)
GFR, EST NON AFRICAN AMERICAN: 12 mL/min — AB (ref >60)
Glucose, Bld: 194 mg/dL — ABNORMAL HIGH (ref 65–99)
PHOSPHORUS: 3.5 mg/dL (ref 2.5–4.6)
Potassium: 3.8 mmol/L (ref 3.5–5.1)
SODIUM: 134 mmol/L — AB (ref 135–145)

## 2015-12-04 LAB — BLOOD GAS, ARTERIAL
ACID-BASE EXCESS: 4.5 mmol/L — AB (ref 0.0–2.0)
BICARBONATE: 27.8 meq/L — AB (ref 20.0–24.0)
Drawn by: 317771
O2 CONTENT: 3 L/min
O2 SAT: 96.5 %
PCO2 ART: 56.2 mmHg — AB (ref 35.0–45.0)
PO2 ART: 91 mmHg (ref 80.0–100.0)
pH, Arterial: 7.345 — ABNORMAL LOW (ref 7.350–7.450)

## 2015-12-04 LAB — GLUCOSE, CAPILLARY
Glucose-Capillary: 148 mg/dL — ABNORMAL HIGH (ref 65–99)
Glucose-Capillary: 157 mg/dL — ABNORMAL HIGH (ref 65–99)
Glucose-Capillary: 142 mg/dL — ABNORMAL HIGH (ref 65–99)
Glucose-Capillary: 157 mg/dL — ABNORMAL HIGH (ref 65–99)

## 2015-12-04 LAB — CBC
HCT: 30 % — ABNORMAL LOW (ref 36.0–46.0)
HEMOGLOBIN: 9.4 g/dL — AB (ref 12.0–15.0)
MCH: 28.1 pg (ref 26.0–34.0)
MCHC: 31.3 g/dL (ref 30.0–36.0)
MCV: 89.8 fL (ref 78.0–100.0)
Platelets: 227 10*3/uL (ref 150–400)
RBC: 3.34 MIL/uL — AB (ref 3.87–5.11)
RDW: 18.4 % — ABNORMAL HIGH (ref 11.5–15.5)
WBC: 7.9 10*3/uL (ref 4.0–10.5)

## 2015-12-04 MED ORDER — HEPARIN SODIUM (PORCINE) 1000 UNIT/ML IJ SOLN
INTRAMUSCULAR | Status: AC
  Filled 2015-12-04: qty 7

## 2015-12-04 MED ORDER — METOPROLOL TARTRATE 50 MG PO TABS
25.0000 mg | ORAL_TABLET | Freq: Two times a day (BID) | ORAL | Status: DC
Start: 2015-12-04 — End: 2018-06-27

## 2015-12-04 MED ORDER — EPOETIN ALFA 10000 UNIT/ML IJ SOLN
INTRAMUSCULAR | Status: AC
  Administered 2015-12-04: 10000 [IU] via INTRAVENOUS
  Filled 2015-12-04: qty 1

## 2015-12-04 MED ORDER — SODIUM CHLORIDE 0.9 % IJ SOLN
INTRAMUSCULAR | Status: AC
Start: 2015-12-04 — End: 2015-12-04
  Administered 2015-12-04: 10 mL via INTRAVENOUS
  Filled 2015-12-04: qty 12

## 2015-12-04 NOTE — Procedures (Signed)
   HEMODIALYSIS TREATMENT NOTE:  4 hour heparin-free dialysis completed via right IJ tunneled catheter. Exit site unremarkable. Goal met: 2.6 liters removed without interruption in ultrafiltration. All blood was returned. Report called to Rosealee Albee, RN.  Rockwell Alexandria, RN, CDN

## 2015-12-04 NOTE — Progress Notes (Signed)
Subjective: Patient continued to feel much better. Presently she denies any nausea or vomiting. Her appetite is good and no difficulty breathing.  Objective: Vital signs in last 24 hours: Temp:  [98.1 F (36.7 C)-98.8 F (37.1 C)] 98.1 F (36.7 C) (12/15 0618) Pulse Rate:  [61-76] 61 (12/15 0618) Resp:  [18-20] 20 (12/15 0618) BP: (113-128)/(54-67) 113/58 mmHg (12/15 0618) SpO2:  [95 %-96 %] 95 % (12/15 0618)  Intake/Output from previous day: 12/14 0701 - 12/15 0700 In: 723 [P.O.:720; I.V.:3] Out: 200 [Urine:200] Intake/Output this shift: Total I/O In: 3 [I.V.:3] Out: -    Recent Labs  12/01/15 1300 12/02/15 0528 12/03/15 0631  HGB 9.2* 9.0* 9.9*    Recent Labs  12/02/15 0528 12/03/15 0631  WBC 6.1 7.3  RBC 3.29* 3.51*  HCT 28.7* 31.0*  PLT 205 222    Recent Labs  12/02/15 0528 12/03/15 0631  NA 134* 136  K 3.5 3.7  CL 98* 101  CO2 31 32  BUN 49* 32*  CREATININE 3.64* 2.66*  GLUCOSE 185* 162*  CALCIUM 8.4* 8.2*   No results for input(s): LABPT, INR in the last 72 hours.  Patient alert and in no apparent distress. Chest decreased breath sound. No wheezing. Heart exam regular rate and rhythm Abdomen : obese and nontender. Extremities patient with chronic lymphedema   Assessment/Plan:  Problem #1 renal failure: Patient presently on dialysis. She is asymptomatic. Problem #2 hyperkalemia: Her potassium has corrected Problem #3 diabetes: Her blood sugar is reasonably controlled. Problem #4 morbid obesity: Presently patient is losing some weight. Problem #5 chronic renal failure: Stage III Problem #6 anemia: Her hemoglobin is below our target goal. Her hemoglobin is stable. Presently she is getting Epogen on dialysis. Problem #7 cellulitis of her left foot. Patient is on antibiotics: She is a febrile and her white blood cell count is normal. Problem #8 nausea and vomiting. Has resolved. Patient does not have any abdominal pain. Her appetite is also  improving. Problem #9 status post respiratory arrest Problem #10 history of sleep apnea: Once CPAP. Plan:1] we'll make arrangements for patient to get dialysis today for 4 hours today. 2] we'll try to get 3 L. Her blood pressure tolerates. 3] we'll continue with present management.     Kyal Arts S 12/04/2015, 9:08 AM

## 2015-12-04 NOTE — Discharge Summary (Signed)
Physician Discharge Summary  Patient ID: [name] MRN: [id number] DOB/AGE: May 27, 1966 49 y.o. Primary Care Physician:Carlin Attridge L, MD Admit date: 11/13/2015 Discharge date: 12/04/2015    Discharge Diagnoses:   Principal Problem:   Cellulitis of leg without foot, right Active Problems:   Lymphedema of lower extremity   Diabetes mellitus (HCC)   Morbid obesity (HCC)   Hyperkalemia   Obesity hypoventilation syndrome (HCC)   Chronic diastolic CHF (congestive heart failure) (HCC)   Chronic respiratory failure with hypoxia (HCC)   CKD (chronic kidney disease), stage III   Anemia of chronic disease   Acute on chronic respiratory failure (HCC)   Respiratory arrest ([place])   Protein-calorie malnutrition, severe     Medication List    STOP taking these medications        HYDROcodone-acetaminophen 5-325 MG tablet  Commonly known as:  NORCO/VICODIN     oseltamivir 75 MG capsule  Commonly known as:  TAMIFLU     oxyCODONE-acetaminophen 5-325 MG tablet  Commonly known as:  PERCOCET/ROXICET     potassium chloride SA 20 MEQ tablet  Commonly known as:  K-DUR,KLOR-CON     torsemide 100 MG tablet  Commonly known as:  DEMADEX      TAKE these medications        acetaminophen 325 MG tablet  Commonly known as:  TYLENOL  Take 650 mg by mouth every 6 (six) hours as needed. fever     ALPRAZolam 0.5 MG tablet  Commonly known as:  XANAX  Take 0.5 mg by mouth 3 (three) times daily as needed for anxiety or sleep.     amLODipine 10 MG tablet  Commonly known as:  NORVASC  Take 10 mg by mouth daily.     diphenhydrAMINE 25 mg capsule  Commonly known as:  BENADRYL  Take 25 mg by mouth every 6 (six) hours as needed for allergies.     docusate sodium 100 MG capsule  Commonly known as:  COLACE  Take 100 mg by mouth 2 (two) times daily as needed. For constipation     insulin NPH-regular Human (70-30) 100 UNIT/ML injection  Commonly known as:  NOVOLIN 70/30  Inject 60 Units  into the skin 2 (two) times daily.     metoprolol 50 MG tablet  Commonly known as:  LOPRESSOR  Take 0.5 tablets (25 mg total) by mouth 2 (two) times daily.     Oxycodone HCl 10 MG Tabs  Take 10 mg by mouth 4 (four) times daily as needed (pain).        Discharged Condition: Improved    Consults: Interventional radiology, nephrology, critical care  Significant Diagnostic Studies: US Renal  11/17/2015  CLINICAL DATA:  49 year old female with chronic renal failure, beginning dialysis. Subsequent encounter. EXAM: RENAL / URINARY TRACT ULTRASOUND COMPLETE COMPARISON:  Noncontrast CT Abdomen and Pelvis 12/02/2014 FINDINGS: Large body habitus. Right Kidney: Length: 13.6 cm. Echogenicity within normal limits. No mass or hydronephrosis visualized. Left Kidney: Length: Could not be identified despite multiple attempts. Bladder: Decompressed via Foley catheter. IMPRESSION: Negative sonographic appearance of the right kidney. The left kidney could not be visualized despite multiple attempts. Electronically Signed   By: Genevie Ann M.D.   On: 11/17/2015 11:10   Ir Fluoro Guide Cv Line Right  11/29/2015  CLINICAL DATA:  End-stage renal disease EXAM: ULTRASOUND GUIDANCE FOR VASCULAR ACCESS RIGHT INTERNAL JUGULAR PERMANENT HEMODIALYSIS CATHETER Date:  12/10/201612/09/2015 11:37 am Radiologist:  M. Daryll Brod, MD Guidance:  Ultrasound and fluoroscopic FLUOROSCOPY TIME:  0.8 minutes, 7.1 mGy MEDICATIONS AND MEDICAL HISTORY: 2 g Ancef administered within 1 hour of the procedure, 1% lidocaine locally ANESTHESIA/SEDATION: None. CONTRAST:  None COMPLICATIONS: None immediate PROCEDURE: Informed consent was obtained from the patient following explanation of the procedure, risks, benefits and alternatives. The patient understands, agrees and consents for the procedure. All questions were addressed. A time out was performed. Maximal barrier sterile technique utilized including caps, mask, sterile gowns, sterile gloves,  large sterile drape, hand hygiene, and 2% chlorhexidine scrub. Under sterile conditions and local anesthesia, right internal jugular micropuncture venous access was performed with ultrasound. Images were obtained for documentation. A guide wire was inserted followed by a transitional dilator. Next, a 0.035 guidewire was advanced into the IVC with a 5-French catheter. Measurements were obtained from the right venotomy site to the proximal right atrium. In the right infraclavicular chest, a subcutaneous tunnel was created under sterile conditions and local anesthesia. 1% lidocaine with epinephrine was utilized for this. The 23 cm tip to cuff dialysis catheter was tunneled subcutaneously to the venotomy site and inserted into the SVC/RA junction through a valved peel-away sheath. Position was confirmed with fluoroscopy. Images were obtained for documentation. Blood was aspirated from the catheter followed by saline and heparin flushes. The appropriate volume and strength of heparin was instilled in each lumen. Caps were applied. The catheter was secured at the tunnel site with Gelfoam and a pursestring suture. The venotomy site was closed with subcuticular Vicryl suture. Dermabond was applied to the small right neck incision. A dry sterile dressing was applied. The catheter is ready for use. No immediate complications. IMPRESSION: Ultrasound and fluoroscopically guided right internal jugular tunneled hemodialysis catheter (23 cm tip to cuff dialysis catheter). Electronically Signed   By: Jerilynn Mages.  Shick M.D.   On: 11/29/2015 12:13   Ir US Guide Vasc Access Right  11/29/2015  CLINICAL DATA:  End-stage renal disease EXAM: ULTRASOUND GUIDANCE FOR VASCULAR ACCESS RIGHT INTERNAL JUGULAR PERMANENT HEMODIALYSIS CATHETER Date:  12/10/201612/09/2015 11:37 am Radiologist:  M. Daryll Brod, MD Guidance:  Ultrasound and fluoroscopic FLUOROSCOPY TIME:  0.8 minutes, 7.1 mGy MEDICATIONS AND MEDICAL HISTORY: 2 g Ancef administered  within 1 hour of the procedure, 1% lidocaine locally ANESTHESIA/SEDATION: None. CONTRAST:  None COMPLICATIONS: None immediate PROCEDURE: Informed consent was obtained from the patient following explanation of the procedure, risks, benefits and alternatives. The patient understands, agrees and consents for the procedure. All questions were addressed. A time out was performed. Maximal barrier sterile technique utilized including caps, mask, sterile gowns, sterile gloves, large sterile drape, hand hygiene, and 2% chlorhexidine scrub. Under sterile conditions and local anesthesia, right internal jugular micropuncture venous access was performed with ultrasound. Images were obtained for documentation. A guide wire was inserted followed by a transitional dilator. Next, a 0.035 guidewire was advanced into the IVC with a 5-French catheter. Measurements were obtained from the right venotomy site to the proximal right atrium. In the right infraclavicular chest, a subcutaneous tunnel was created under sterile conditions and local anesthesia. 1% lidocaine with epinephrine was utilized for this. The 23 cm tip to cuff dialysis catheter was tunneled subcutaneously to the venotomy site and inserted into the SVC/RA junction through a valved peel-away sheath. Position was confirmed with fluoroscopy. Images were obtained for documentation. Blood was aspirated from the catheter followed by saline and heparin flushes. The appropriate volume and strength of heparin was instilled in each lumen. Caps were applied. The catheter was secured at the tunnel site with Gelfoam and a pursestring  suture. The venotomy site was closed with subcuticular Vicryl suture. Dermabond was applied to the small right neck incision. A dry sterile dressing was applied. The catheter is ready for use. No immediate complications. IMPRESSION: Ultrasound and fluoroscopically guided right internal jugular tunneled hemodialysis catheter (23 cm tip to cuff dialysis  catheter). Electronically Signed   By: Jerilynn Mages.  Shick M.D.   On: 11/29/2015 12:13   Dg Chest Portable 1 View  11/13/2015  CLINICAL DATA:  Shortness of Breath EXAM: PORTABLE CHEST 1 VIEW COMPARISON:  December 02, 2014 FINDINGS: Port-A-Cath tip is in the superior vena cava. No pneumothorax. There is cardiomegaly with pulmonary venous hypertension. There is no frank edema or consolidation. No adenopathy. IMPRESSION: Pulmonary vascular congestion.  No frank edema or consolidation. Electronically Signed   By: Lowella Grip III M.D.   On: 11/13/2015 15:45    Lab Results: Basic Metabolic Panel:  Recent Labs  12/02/15 0528 12/03/15 0631  NA 134* 136  K 3.5 3.7  CL 98* 101  CO2 31 32  GLUCOSE 185* 162*  BUN 49* 32*  CREATININE 3.64* 2.66*  CALCIUM 8.4* 8.2*  MG 2.0  --   PHOS 3.7 2.8   Liver Function Tests:  Recent Labs  12/02/15 0528  ALBUMIN 2.5*     CBC:  Recent Labs  12/01/15 1300 12/02/15 0528 12/03/15 0631  WBC 6.2 6.1 7.3  NEUTROABS 4.4 4.0  --   HGB 9.2* 9.0* 9.9*  HCT 28.5* 28.7* 31.0*  MCV 86.6 87.2 88.3  PLT 187 205 222    Recent Results (from the past 240 hour(s))  MRSA PCR Screening     Status: None   Collection Time: 11/28/15  2:15 PM  Result Value Ref Range Status   MRSA by PCR NEGATIVE NEGATIVE Final    Comment:        The GeneXpert MRSA Assay (FDA approved for NASAL specimens only), is one component of a comprehensive MRSA colonization surveillance program. It is not intended to diagnose MRSA infection nor to guide or monitor treatment for MRSA infections.      Hospital Course: This is a 49 year old who came to the hospital because of cellulitis of her leg. At baseline she has morbid obesity obesity hypoventilation chronic respiratory failure chronic kidney disease diabetes and lymphedema. She was treated for cellulitis and this improved but her renal function deteriorated to the point that she required dialysis. She also had trouble with  confusion in the mornings it was related to obesity hypoventilation and chronic respiratory failure. It became obvious that she was going to require dialysis at home and she was sent to interventional radiology for permacath placement but when she received sedation to have this done she had respiratory arrest. She maintained on the critical care service and then was transferred back here to [facility] when she was stabilized. Over the next several days she has improved and is ready for discharge although she is high risk for return  Discharge Exam: Blood pressure 113/58, pulse 61, temperature 98.1 F (36.7 C), temperature source Oral, resp. rate 20, height _0  (1.651 m), weight 190.057 kg (419 lb), SpO2 95 %. She is awake and alert. She is morbidly obese. Her chest shows diminished breath sounds. Her heart is regular. Her lymphedema is unchanged  Disposition: Home with home health services      Discharge Instructions    Discharge patient    Complete by:  As directed   After dialysis     Face-to-face  encounter (required for Medicare/Medicaid patients)    Complete by:  As directed   I Kitzia Camus L certify that this patient is under my care and that I, or a nurse practitioner or physician's assistant working with me, had a face-to-face encounter that meets the physician face-to-face encounter requirements with this patient on 12/04/2015. The encounter with the patient was in whole, or in part for the following medical condition(s) which is the primary reason for home health care (List medical condition): Cellulitis/renal failure/respiratory failure  The encounter with the patient was in whole, or in part, for the following medical condition, which is the primary reason for home health care:  Cellulitis/renal failure/respiratory failure  I certify that, based on my findings, the following services are medically necessary home health services:   Nursing Physical therapy    Reason for  Medically Necessary Home Health Services:  Skilled Nursing- Change/Decline in Patient Status  My clinical findings support the need for the above services:  Unable to leave home safely without assistance and/or assistive device  Further, I certify that my clinical findings support that this patient is homebound due to:  Unable to leave home safely without assistance     Home Health    Complete by:  As directed   To provide the following care/treatments:   PT RN               Signed: Tracie Dore L   12/04/2015, 9:22 AM

## 2015-12-04 NOTE — Clinical Social Work Note (Signed)
Per MD, okay to start outpatient dialysis Tuesday. Pt aware and is working on transportation. CSW notified [facility] and pt will arrive prior to 11:25 appointment to complete paperwork.   Benay Pike, [place]

## 2015-12-04 NOTE — Progress Notes (Signed)
Subjective: She says she feels much better. Her breathing is okay. She has no other new complaints.  Objective: Vital signs in last 24 hours: Temp:  [98.1 F (36.7 C)-98.8 F (37.1 C)] 98.1 F (36.7 C) (12/15 0618) Pulse Rate:  [61-76] 61 (12/15 0618) Resp:  [18-20] 20 (12/15 0618) BP: (113-128)/(54-67) 113/58 mmHg (12/15 0618) SpO2:  [95 %-96 %] 95 % (12/15 0618) Weight change:  Last BM Date: 11/30/15  Intake/Output from previous day: 12/14 0701 - 12/15 0700 In: 723 [P.O.:720; I.V.:3] Out: 200 [Urine:200]  PHYSICAL EXAM General appearance: alert, cooperative, no distress and morbidly obese Resp: diminished breath sounds bilaterally Cardio: regular rate and rhythm, S1, S2 normal, no murmur, click, rub or gallop GI: soft, non-tender; bowel sounds normal; no masses,  no organomegaly Extremities: Chronic lymphedema which is unchanged  Lab Results:  Results for orders placed or performed during the hospital encounter of 11/13/15 (from the past 48 hour(s))  Glucose, capillary     Status: Abnormal   Collection Time: 12/02/15 11:30 AM  Result Value Ref Range   Glucose-Capillary 194 (H) 65 - 99 mg/dL   Comment 1 Document in Chart   Blood gas, arterial     Status: Abnormal   Collection Time: 12/02/15  5:10 PM  Result Value Ref Range   O2 Content 3.0 L/min   Delivery systems NASAL CANNULA    pH, Arterial 7.397 7.350 - 7.450   pCO2 arterial 48.5 (H) 35.0 - 45.0 mmHg   pO2, Arterial 112 (H) 80.0 - 100.0 mmHg   Bicarbonate 28.2 (H) 20.0 - 24.0 mEq/L   TCO2 13.8 0 - 100 mmol/L   Acid-Base Excess 4.6 (H) 0.0 - 2.0 mmol/L   O2 Saturation 97.9 %   Collection site LEFT RADIAL    Drawn by [id number]    Sample type ARTERIAL DRAW    Allens test (pass/fail) PASS PASS  Glucose, capillary     Status: Abnormal   Collection Time: 12/02/15  8:41 PM  Result Value Ref Range   Glucose-Capillary 169 (H) 65 - 99 mg/dL  Blood gas, arterial     Status: Abnormal   Collection Time: 12/03/15  4:20 AM   Result Value Ref Range   O2 Content 4.0 L/min   Delivery systems NASAL CANNULA    pH, Arterial 7.366 7.350 - 7.450   pCO2 arterial 54.1 (H) 35.0 - 45.0 mmHg   pO2, Arterial 89.6 80.0 - 100.0 mmHg   Bicarbonate 28.5 (H) 20.0 - 24.0 mEq/L   TCO2 12.4 0 - 100 mmol/L   Acid-Base Excess 5.1 (H) 0.0 - 2.0 mmol/L   O2 Saturation 96.3 %   Collection site RIGHT RADIAL    Drawn by [id number]    Sample type ARTERIAL DRAW    Allens test (pass/fail) PASS PASS  Basic metabolic panel     Status: Abnormal   Collection Time: 12/03/15  6:31 AM  Result Value Ref Range   Sodium 136 135 - 145 mmol/L   Potassium 3.7 3.5 - 5.1 mmol/L   Chloride 101 101 - 111 mmol/L   CO2 32 22 - 32 mmol/L   Glucose, Bld 162 (H) 65 - 99 mg/dL   BUN 32 (H) 6 - 20 mg/dL   Creatinine, Ser 2.66 (H) 0.44 - 1.00 mg/dL   Calcium 8.2 (L) 8.9 - 10.3 mg/dL   GFR calc non Af Amer 20 (L) >60 mL/min   GFR calc Af Amer 23 (L) >60 mL/min    Comment: (NOTE) The eGFR has  been calculated using the CKD EPI equation. This calculation has not been validated in all clinical situations. eGFR's persistently <60 mL/min signify possible Chronic Kidney Disease.    Anion gap 3 (L) 5 - 15  CBC     Status: Abnormal   Collection Time: 12/03/15  6:31 AM  Result Value Ref Range   WBC 7.3 4.0 - 10.5 K/uL   RBC 3.51 (L) 3.87 - 5.11 MIL/uL   Hemoglobin 9.9 (L) 12.0 - 15.0 g/dL   HCT 31.0 (L) 36.0 - 46.0 %   MCV 88.3 78.0 - 100.0 fL   MCH 28.2 26.0 - 34.0 pg   MCHC 31.9 30.0 - 36.0 g/dL   RDW 17.5 (H) 11.5 - 15.5 %   Platelets 222 150 - 400 K/uL  Phosphorus     Status: None   Collection Time: 12/03/15  6:31 AM  Result Value Ref Range   Phosphorus 2.8 2.5 - 4.6 mg/dL  Glucose, capillary     Status: Abnormal   Collection Time: 12/03/15  7:30 AM  Result Value Ref Range   Glucose-Capillary 154 (H) 65 - 99 mg/dL   Comment 1 Notify RN    Comment 2 Document in Chart   Glucose, capillary     Status: Abnormal   Collection Time: 12/03/15 11:14 AM   Result Value Ref Range   Glucose-Capillary 150 (H) 65 - 99 mg/dL   Comment 1 Notify RN    Comment 2 Document in Chart   Glucose, capillary     Status: Abnormal   Collection Time: 12/03/15  4:32 PM  Result Value Ref Range   Glucose-Capillary 160 (H) 65 - 99 mg/dL   Comment 1 Notify RN    Comment 2 Document in Chart   Glucose, capillary     Status: Abnormal   Collection Time: 12/03/15  9:44 PM  Result Value Ref Range   Glucose-Capillary 158 (H) 65 - 99 mg/dL   Comment 1 Notify RN    Comment 2 Document in Chart   Blood gas, arterial     Status: Abnormal   Collection Time: 12/04/15  5:00 AM  Result Value Ref Range   O2 Content 3.0 L/min   Delivery systems NASAL CANNULA    pH, Arterial 7.345 (L) 7.350 - 7.450   pCO2 arterial 56.2 (H) 35.0 - 45.0 mmHg   pO2, Arterial 91.0 80.0 - 100.0 mmHg   Bicarbonate 27.8 (H) 20.0 - 24.0 mEq/L   Acid-Base Excess 4.5 (H) 0.0 - 2.0 mmol/L   O2 Saturation 96.5 %   Collection site RIGHT RADIAL    Drawn by [id number]    Sample type ARTERIAL DRAW    Allens test (pass/fail) PASS PASS  Glucose, capillary     Status: Abnormal   Collection Time: 12/04/15  7:15 AM  Result Value Ref Range   Glucose-Capillary 148 (H) 65 - 99 mg/dL   Comment 1 Notify RN    Comment 2 Document in Chart     ABGS  Recent Labs  12/03/15 0420 12/04/15 0500  PHART 7.366 7.345*  PO2ART 89.6 91.0  TCO2 12.4  --   HCO3 28.5* 27.8*   CULTURES Recent Results (from the past 240 hour(s))  MRSA PCR Screening     Status: None   Collection Time: 11/28/15  2:15 PM  Result Value Ref Range Status   MRSA by PCR NEGATIVE NEGATIVE Final    Comment:        The GeneXpert MRSA Assay (FDA approved for NASAL specimens  only), is one component of a comprehensive MRSA colonization surveillance program. It is not intended to diagnose MRSA infection nor to guide or monitor treatment for MRSA infections.    Studies/Results: No results found.  Medications:  Prior to Admission:   Prescriptions prior to admission  Medication Sig Dispense Refill Last Dose  . ALPRAZolam (XANAX) 0.5 MG tablet Take 0.5 mg by mouth 3 (three) times daily as needed for anxiety or sleep.    11/12/2015 at Unknown time  . amLODipine (NORVASC) 10 MG tablet Take 10 mg by mouth daily.   11/12/2015 at Unknown time  . insulin NPH-insulin regular (NOVOLIN 70/30) (70-30) 100 UNIT/ML injection Inject 60 Units into the skin 2 (two) times daily.   11/12/2015 at Unknown time  . metoprolol (LOPRESSOR) 50 MG tablet Take 50 mg by mouth 2 (two) times daily.   11/12/2015 at Unknown time  . Oxycodone HCl 10 MG TABS Take 10 mg by mouth 4 (four) times daily as needed (pain).    Past Week at Unknown time  . potassium chloride SA (K-DUR,KLOR-CON) 20 MEQ tablet Take 20 mEq by mouth 3 (three) times daily.   11/12/2015 at Unknown time  . torsemide (DEMADEX) 100 MG tablet Take 100 mg by mouth 2 (two) times daily.    11/12/2015 at Unknown time  . acetaminophen (TYLENOL) 325 MG tablet Take 650 mg by mouth every 6 (six) hours as needed. fever   unknown  . diphenhydrAMINE (BENADRYL) 25 mg capsule Take 25 mg by mouth every 6 (six) hours as needed for allergies.   unknown  . docusate sodium (COLACE) 100 MG capsule Take 100 mg by mouth 2 (two) times daily as needed. For constipation   unknown  . HYDROcodone-acetaminophen (NORCO/VICODIN) 5-325 MG per tablet Take 2 tablets by mouth every 4 (four) hours as needed. (Patient not taking: Reported on 12/02/2014) 10 tablet 0   . oseltamivir (TAMIFLU) 75 MG capsule Take 1 capsule (75 mg total) by mouth every 12 (twelve) hours. (Patient not taking: Reported on 12/02/2014) 10 capsule 0   . oxyCODONE-acetaminophen (PERCOCET/ROXICET) 5-325 MG per tablet Take 1 tablet by mouth every 6 (six) hours as needed for severe pain. 15 tablet 0 unknown   Scheduled: . epoetin (EPOGEN/PROCRIT) injection  10,000 Units Intravenous Q T,Th,Sa-HD  . feeding supplement (NEPRO CARB STEADY)  237 mL Oral Q24H  .  feeding supplement (PRO-STAT SUGAR FREE 64)  30 mL Oral BID  . ferric gluconate (FERRLECIT/NULECIT) IV  125 mg Intravenous Once  . insulin aspart  0-20 Units Subcutaneous TID WC  . insulin aspart  0-5 Units Subcutaneous QHS  . metoprolol tartrate  25 mg Oral BID  . nystatin   Topical BID  . ondansetron (ZOFRAN) IV  4 mg Intravenous 4 times per day  . pantoprazole (PROTONIX) IV  40 mg Intravenous Q12H  . polyethylene glycol  17 g Oral Daily  . sodium chloride  10-40 mL Intracatheter Q12H  . sodium chloride  10-40 mL Intracatheter Q12H  . sodium chloride  3 mL Intravenous Q12H   Continuous:  GXQ:JJHERD chloride, sodium chloride, sodium chloride, sodium chloride, alteplase, alteplase, ondansetron (ZOFRAN) IV, oxyCODONE, promethazine, sodium chloride, sodium chloride, sodium chloride, sodium chloride  Assesment: She was admitted with cellulitis of her leg. This is related to chronic lymphedema. She then developed acute on chronic renal failure and has been requiring dialysis. She has obesity hypoventilation and acute on chronic respiratory failure. She had a respiratory arrest. She is much improved in general. Principal  Problem:   Cellulitis of leg without foot, right Active Problems:   Lymphedema of lower extremity   Diabetes mellitus (HCC)   Morbid obesity (HCC)   Hyperkalemia   Obesity hypoventilation syndrome (HCC)   Chronic diastolic CHF (congestive heart failure) (HCC)   Chronic respiratory failure with hypoxia (HCC)   CKD (chronic kidney disease), stage III   Anemia of chronic disease   Acute on chronic respiratory failure (HCC)   Respiratory arrest ([place])   Protein-calorie malnutrition, severe    Plan: Potential discharge home today after dialysis    LOS: 21 days   [name] L 12/04/2015, 9:14 AM

## 2015-12-05 ENCOUNTER — Encounter (HOSPITAL_COMMUNITY): Payer: Self-pay

## 2015-12-05 LAB — CBC
HCT: 29.9 % — ABNORMAL LOW (ref 36.0–46.0)
HEMOGLOBIN: 9.4 g/dL — AB (ref 12.0–15.0)
MCH: 28 pg (ref 26.0–34.0)
MCHC: 31.4 g/dL (ref 30.0–36.0)
MCV: 89 fL (ref 78.0–100.0)
PLATELETS: 219 10*3/uL (ref 150–400)
RBC: 3.36 MIL/uL — AB (ref 3.87–5.11)
RDW: 18 % — AB (ref 11.5–15.5)
WBC: 8.9 10*3/uL (ref 4.0–10.5)

## 2015-12-05 LAB — RENAL FUNCTION PANEL
ANION GAP: 6 (ref 5–15)
Albumin: 2.7 g/dL — ABNORMAL LOW (ref 3.5–5.0)
BUN: 29 mg/dL — ABNORMAL HIGH (ref 6–20)
CHLORIDE: 98 mmol/L — AB (ref 101–111)
CO2: 29 mmol/L (ref 22–32)
Calcium: 8.2 mg/dL — ABNORMAL LOW (ref 8.9–10.3)
Creatinine, Ser: 3.89 mg/dL — ABNORMAL HIGH (ref 0.44–1.00)
GFR calc non Af Amer: 13 mL/min — ABNORMAL LOW (ref >60)
GFR, EST AFRICAN AMERICAN: 15 mL/min — AB (ref >60)
Glucose, Bld: 225 mg/dL — ABNORMAL HIGH (ref 65–99)
POTASSIUM: 4.3 mmol/L (ref 3.5–5.1)
Phosphorus: 3.2 mg/dL (ref 2.5–4.6)
Sodium: 133 mmol/L — ABNORMAL LOW (ref 135–145)

## 2015-12-05 LAB — PULMONARY FUNCTION TEST
FEF 25-75 PRE: 0.97 L/s
FEF2575-%Pred-Pre: 38 %
FEV1-%PRED-PRE: 39 %
FEV1-Pre: 0.96 L
FEV1FVC-%PRED-PRE: 104 %
FEV6-%PRED-PRE: 38 %
FEV6-PRE: 1.12 L
FEV6FVC-%Pred-Pre: 103 %
FVC-%Pred-Pre: 37 %
FVC-Pre: 1.12 L
PRE FEV1/FVC RATIO: 86 %
Pre FEV6/FVC Ratio: 100 %

## 2015-12-05 LAB — GLUCOSE, CAPILLARY
GLUCOSE-CAPILLARY: 112 mg/dL — AB (ref 65–99)
Glucose-Capillary: 198 mg/dL — ABNORMAL HIGH (ref 65–99)
Glucose-Capillary: 213 mg/dL — ABNORMAL HIGH (ref 65–99)

## 2015-12-05 MED ORDER — ALUM & MAG HYDROXIDE-SIMETH 200-200-20 MG/5ML PO SUSP
30.0000 mL | Freq: Four times a day (QID) | ORAL | Status: DC | PRN
  Administered 2015-12-05: 30 mL via ORAL
  Filled 2015-12-05: qty 30

## 2015-12-05 MED ORDER — SODIUM CHLORIDE 0.9 % IJ SOLN
INTRAMUSCULAR | Status: AC
  Filled 2015-12-05: qty 12

## 2015-12-05 MED ORDER — SODIUM CHLORIDE 0.9 % IV SOLN: 100.0000 mL | INTRAVENOUS | Status: DC | PRN

## 2015-12-05 MED ORDER — HEPARIN SODIUM (PORCINE) 1000 UNIT/ML IJ SOLN
INTRAMUSCULAR | Status: AC
  Filled 2015-12-05: qty 6

## 2015-12-05 MED ORDER — HEPARIN SODIUM (PORCINE) 1000 UNIT/ML DIALYSIS
1000.0000 [IU] | INTRAMUSCULAR | Status: DC | PRN
  Filled 2015-12-05: qty 1

## 2015-12-05 MED ORDER — HEPARIN SOD (PORK) LOCK FLUSH 100 UNIT/ML IV SOLN
500.0000 [IU] | INTRAVENOUS | Status: AC | PRN
  Administered 2015-12-05: 500 [IU]
  Filled 2015-12-05: qty 5

## 2015-12-05 NOTE — Care Management Note (Signed)
Case Management Note  Patient Details  Name: [name] MRN: [id number] Date of Birth: 06-06-1966  Subjective/Objective:                    Action/Plan:   Expected Discharge Date:  11/18/15               Expected Discharge Plan:  [place]  In-House Referral:  Financial Counselor, Clinical Social Work  Discharge planning Services  CM Consult, [facility] Program  Post Acute Care Choice:  Home Health, Durable Medical Equipment Choice offered to:  Patient  DME Arranged:  Hospital bed, Bipap, Trapeze, Other see comment (hoyer lift) DME Agency:  [place]:  RN, Nurse's Aide, PT [place] Agency:  [place]  Status of Service:  Completed, signed off  Medicare Important Message Given:    Date Medicare IM Given:    Medicare IM give by:    Date Additional Medicare IM Given:    Additional Medicare Important Message give by:     If discussed at [place] of Stay Meetings, dates discussed:    Additional Comments: Pt discharged home today with [facility] RN, Pt and aide (pt has no insurance). Romualdo Bolk of [facility] is aware and will collect the pts information from the chart. [place] services to start within 48 hours of discharge. Pts hospital bed and biapap will be delivered to pts home today. Pt already has home O2 with [facility] in place. Pt is aware of dialysis schedule and transportation has been arranged by pt. Pt and pts nurse aware of discharge arrangements. Christinia Gully [name], RN 12/05/2015, 10:56 AM

## 2015-12-05 NOTE — Progress Notes (Signed)
She was unable to be discharged yesterday but is ready for discharge today. She will have dialysis before she goes in to start outpatient dialysis on Tuesday.

## 2015-12-05 NOTE — Progress Notes (Signed)
Subjective: Patient offers no complaint. She does not have any nausea or vomiting. Her appetite is good.  Objective: Vital signs in last 24 hours: Temp:  [97.8 F (36.6 C)-98.5 F (36.9 C)] 97.8 F (36.6 C) (12/16 0534) Pulse Rate:  [58-85] 85 (12/16 0534) Resp:  [20-22] 20 (12/16 0534) BP: (93-144)/(53-79) 93/66 mmHg (12/16 0534) SpO2:  [88 %-97 %] 97 % (12/16 0547) Weight:  [303 lb (137.44 kg)] 303 lb (137.44 kg) (12/16 0547)  Intake/Output from previous day: 12/15 0701 - 12/16 0700 In: 723 [P.O.:720; I.V.:3] Out: 2751 [Urine:150; Stool:1] Intake/Output this shift: Total I/O In: 120 [P.O.:120] Out: -    Recent Labs  12/03/15 0631 12/04/15 1530  HGB 9.9* 9.4*    Recent Labs  12/03/15 0631 12/04/15 1530  WBC 7.3 7.9  RBC 3.51* 3.34*  HCT 31.0* 30.0*  PLT 222 227    Recent Labs  12/03/15 0631 12/04/15 1529  NA 136 134*  K 3.7 3.8  CL 101 95*  CO2 32 29  BUN 32* 37*  CREATININE 2.66* 4.09*  GLUCOSE 162* 194*  CALCIUM 8.2* 8.5*   No results for input(s): LABPT, INR in the last 72 hours.  Patient alert and in no apparent distress. Chest decreased breath sound. No wheezing. Heart exam regular rate and rhythm Abdomen : obese and nontender. Extremities patient with chronic lymphedema   Assessment/Plan:  Problem #1 renal failure: Status post hemodialysis yesterday. Presently she didn't have any uremic signs and symptoms. Problem #2 hyperkalemia: Her potassium is normal Problem #3 diabetes: Her blood sugar is reasonably controlled. Problem #4 morbid obesity: Presently patient is losing some weight. Problem #5anemia: Her hemoglobin is stable. Patient is on Epogen during dialysis. Problem #6 cellulitis of her left foot. Patient is on antibiotics: She is a febrile and her white blood cell count is normal. Problem #7 nausea and vomiting. Has resolved.  Problem #9 status post respiratory arrest Problem #9 history of sleep apnea: Once CPAP. Plan:1] we'll make  arrangements for patient to get dialysis today for 3 hours. Patient states that she has difficulty getting transportation over the weekend. Hence she may not be able to come to dialysis until Monday or Tuesday.     Nahiara Kretzschmar S 12/05/2015, 9:14 AM

## 2015-12-05 NOTE — Progress Notes (Addendum)
Simple Spirometry preformed this am: Her Predicated values according to age/height/gender/race are:  FVC predicated:  3.02  FEV1 predicated:  2.43   FEV1/FVC predicated:  82  Her performance results are:   FVC:  1.12  (37%)  FEV1:  0.96  (39%)  FEV1/FVC:  86

## 2015-12-05 NOTE — Procedures (Signed)
HEMODIALYSIS TREATMENT NOTE:  3 hour heparin-free dialysis completed via right IJ tunneled catheter. Goal met. 2.7 liters removed. All blood returned. Report called to primary RN.  Rockwell Alexandria, RN, CDN

## 2015-12-05 NOTE — Progress Notes (Signed)
Pt complains of heartburn. Asked nurse to page MD for something for heartburn. MD ordered Maalox PRN.

## 2015-12-05 NOTE — Discharge Summary (Signed)
This is an addendum to previous discharge summary. She stayed another night due to late dialysis. She is worried about getting to dialysis on 12/06/2015 so she was dialyzed again on 12/05/2015 and will be discharged after that.

## 2015-12-07 ENCOUNTER — Encounter (HOSPITAL_COMMUNITY): Payer: Self-pay | Admitting: *Deleted

## 2015-12-07 ENCOUNTER — Inpatient Hospital Stay (HOSPITAL_COMMUNITY)
Admission: EM | Admit: 2015-12-07 | Discharge: 2015-12-10 | DRG: 444 | Disposition: A | Discharge status: Home Health | Payer: Medicaid Other | Attending: Pulmonary Disease | Admitting: Pulmonary Disease

## 2015-12-07 DIAGNOSIS — R1111 Vomiting without nausea: Secondary | ICD-10-CM | POA: Diagnosis not present

## 2015-12-07 DIAGNOSIS — Z823 Family history of stroke: Secondary | ICD-10-CM

## 2015-12-07 DIAGNOSIS — I2510 Atherosclerotic heart disease of native coronary artery without angina pectoris: Secondary | ICD-10-CM | POA: Diagnosis present

## 2015-12-07 DIAGNOSIS — M797 Fibromyalgia: Secondary | ICD-10-CM | POA: Diagnosis present

## 2015-12-07 DIAGNOSIS — R748 Abnormal levels of other serum enzymes: Secondary | ICD-10-CM

## 2015-12-07 DIAGNOSIS — R109 Unspecified abdominal pain: Secondary | ICD-10-CM | POA: Diagnosis present

## 2015-12-07 DIAGNOSIS — J449 Chronic obstructive pulmonary disease, unspecified: Secondary | ICD-10-CM | POA: Diagnosis present

## 2015-12-07 DIAGNOSIS — E785 Hyperlipidemia, unspecified: Secondary | ICD-10-CM | POA: Diagnosis present

## 2015-12-07 DIAGNOSIS — N19 Unspecified kidney failure: Secondary | ICD-10-CM | POA: Diagnosis present

## 2015-12-07 DIAGNOSIS — R112 Nausea with vomiting, unspecified: Secondary | ICD-10-CM

## 2015-12-07 DIAGNOSIS — Z794 Long term (current) use of insulin: Secondary | ICD-10-CM

## 2015-12-07 DIAGNOSIS — K828 Other specified diseases of gallbladder: Secondary | ICD-10-CM

## 2015-12-07 DIAGNOSIS — E119 Type 2 diabetes mellitus without complications: Secondary | ICD-10-CM

## 2015-12-07 DIAGNOSIS — E1122 Type 2 diabetes mellitus with diabetic chronic kidney disease: Secondary | ICD-10-CM | POA: Diagnosis present

## 2015-12-07 DIAGNOSIS — I5032 Chronic diastolic (congestive) heart failure: Secondary | ICD-10-CM | POA: Diagnosis present

## 2015-12-07 DIAGNOSIS — I120 Hypertensive chronic kidney disease with stage 5 chronic kidney disease or end stage renal disease: Secondary | ICD-10-CM | POA: Diagnosis present

## 2015-12-07 DIAGNOSIS — N186 End stage renal disease: Secondary | ICD-10-CM | POA: Diagnosis present

## 2015-12-07 DIAGNOSIS — Z800 Family history of malignant neoplasm of digestive organs: Secondary | ICD-10-CM

## 2015-12-07 DIAGNOSIS — Z992 Dependence on renal dialysis: Secondary | ICD-10-CM

## 2015-12-07 DIAGNOSIS — Z9981 Dependence on supplemental oxygen: Secondary | ICD-10-CM

## 2015-12-07 DIAGNOSIS — K2970 Gastritis, unspecified, without bleeding: Secondary | ICD-10-CM | POA: Diagnosis not present

## 2015-12-07 DIAGNOSIS — E871 Hypo-osmolality and hyponatremia: Secondary | ICD-10-CM | POA: Diagnosis present

## 2015-12-07 DIAGNOSIS — K811 Chronic cholecystitis: Secondary | ICD-10-CM | POA: Diagnosis present

## 2015-12-07 DIAGNOSIS — E7800 Pure hypercholesterolemia, unspecified: Secondary | ICD-10-CM | POA: Diagnosis present

## 2015-12-07 DIAGNOSIS — E662 Morbid (severe) obesity with alveolar hypoventilation: Secondary | ICD-10-CM | POA: Diagnosis present

## 2015-12-07 DIAGNOSIS — I890 Lymphedema, not elsewhere classified: Secondary | ICD-10-CM

## 2015-12-07 DIAGNOSIS — I10 Essential (primary) hypertension: Secondary | ICD-10-CM | POA: Diagnosis present

## 2015-12-07 DIAGNOSIS — D638 Anemia in other chronic diseases classified elsewhere: Secondary | ICD-10-CM | POA: Diagnosis present

## 2015-12-07 DIAGNOSIS — E43 Unspecified severe protein-calorie malnutrition: Secondary | ICD-10-CM | POA: Diagnosis present

## 2015-12-07 DIAGNOSIS — Z6841 Body Mass Index (BMI) 40.0 and over, adult: Secondary | ICD-10-CM

## 2015-12-07 DIAGNOSIS — Z8249 Family history of ischemic heart disease and other diseases of the circulatory system: Secondary | ICD-10-CM

## 2015-12-07 DIAGNOSIS — J9611 Chronic respiratory failure with hypoxia: Secondary | ICD-10-CM | POA: Diagnosis present

## 2015-12-07 LAB — CBC WITH DIFFERENTIAL/PLATELET
BASOS ABS: 0 10*3/uL (ref 0.0–0.1)
Basophils Relative: 0 %
EOS PCT: 1 %
Eosinophils Absolute: 0.1 10*3/uL (ref 0.0–0.7)
HCT: 32.3 % — ABNORMAL LOW (ref 36.0–46.0)
HEMOGLOBIN: 10.2 g/dL — AB (ref 12.0–15.0)
LYMPHS ABS: 1.1 10*3/uL (ref 0.7–4.0)
LYMPHS PCT: 11 %
MCH: 28.1 pg (ref 26.0–34.0)
MCHC: 31.6 g/dL (ref 30.0–36.0)
MCV: 89 fL (ref 78.0–100.0)
Monocytes Absolute: 0.6 10*3/uL (ref 0.1–1.0)
Monocytes Relative: 5 %
NEUTROS PCT: 83 %
Neutro Abs: 8.7 10*3/uL — ABNORMAL HIGH (ref 1.7–7.7)
PLATELETS: 268 10*3/uL (ref 150–400)
RBC: 3.63 MIL/uL — AB (ref 3.87–5.11)
RDW: 17.8 % — ABNORMAL HIGH (ref 11.5–15.5)
WBC: 10.5 10*3/uL (ref 4.0–10.5)

## 2015-12-07 NOTE — ED Notes (Signed)
Pt arrived to er via [facility] with c/o abd pain, vomiting blood that started today, pt states that she was recently discharged from the hospital for cellulitis

## 2015-12-08 ENCOUNTER — Observation Stay (HOSPITAL_COMMUNITY): Payer: Medicaid Other

## 2015-12-08 ENCOUNTER — Emergency Department (HOSPITAL_COMMUNITY): Payer: Medicaid Other

## 2015-12-08 DIAGNOSIS — R109 Unspecified abdominal pain: Secondary | ICD-10-CM | POA: Diagnosis not present

## 2015-12-08 DIAGNOSIS — N186 End stage renal disease: Secondary | ICD-10-CM | POA: Diagnosis not present

## 2015-12-08 DIAGNOSIS — Z794 Long term (current) use of insulin: Secondary | ICD-10-CM

## 2015-12-08 DIAGNOSIS — I890 Lymphedema, not elsewhere classified: Secondary | ICD-10-CM

## 2015-12-08 DIAGNOSIS — I10 Essential (primary) hypertension: Secondary | ICD-10-CM

## 2015-12-08 DIAGNOSIS — R112 Nausea with vomiting, unspecified: Secondary | ICD-10-CM | POA: Diagnosis not present

## 2015-12-08 DIAGNOSIS — R1013 Epigastric pain: Secondary | ICD-10-CM

## 2015-12-08 DIAGNOSIS — Z992 Dependence on renal dialysis: Secondary | ICD-10-CM | POA: Diagnosis not present

## 2015-12-08 DIAGNOSIS — K811 Chronic cholecystitis: Secondary | ICD-10-CM | POA: Diagnosis not present

## 2015-12-08 DIAGNOSIS — E1122 Type 2 diabetes mellitus with diabetic chronic kidney disease: Secondary | ICD-10-CM | POA: Diagnosis not present

## 2015-12-08 DIAGNOSIS — N184 Chronic kidney disease, stage 4 (severe): Secondary | ICD-10-CM

## 2015-12-08 DIAGNOSIS — K828 Other specified diseases of gallbladder: Secondary | ICD-10-CM | POA: Diagnosis not present

## 2015-12-08 DIAGNOSIS — E662 Morbid (severe) obesity with alveolar hypoventilation: Secondary | ICD-10-CM | POA: Diagnosis not present

## 2015-12-08 LAB — HEMOGLOBIN AND HEMATOCRIT, BLOOD
HEMATOCRIT: 33.1 % — AB (ref 36.0–46.0)
HEMATOCRIT: 33.8 % — AB (ref 36.0–46.0)
HEMOGLOBIN: 10.5 g/dL — AB (ref 12.0–15.0)
HEMOGLOBIN: 10.9 g/dL — AB (ref 12.0–15.0)

## 2015-12-08 LAB — CBC
HCT: 32 % — ABNORMAL LOW (ref 36.0–46.0)
Hemoglobin: 10.1 g/dL — ABNORMAL LOW (ref 12.0–15.0)
MCH: 27.8 pg (ref 26.0–34.0)
MCHC: 31.6 g/dL (ref 30.0–36.0)
MCV: 88.2 fL (ref 78.0–100.0)
PLATELETS: 271 10*3/uL (ref 150–400)
RBC: 3.63 MIL/uL — AB (ref 3.87–5.11)
RDW: 17.5 % — AB (ref 11.5–15.5)
WBC: 12.8 10*3/uL — AB (ref 4.0–10.5)

## 2015-12-08 LAB — COMPREHENSIVE METABOLIC PANEL
ALBUMIN: 3 g/dL — AB (ref 3.5–5.0)
ALT: 7 U/L — AB (ref 14–54)
AST: 15 U/L (ref 15–41)
Alkaline Phosphatase: 131 U/L — ABNORMAL HIGH (ref 38–126)
Anion gap: 9 (ref 5–15)
BILIRUBIN TOTAL: 0.8 mg/dL (ref 0.3–1.2)
BUN: 32 mg/dL — AB (ref 6–20)
CHLORIDE: 99 mmol/L — AB (ref 101–111)
CO2: 29 mmol/L (ref 22–32)
CREATININE: 4.37 mg/dL — AB (ref 0.44–1.00)
Calcium: 8.7 mg/dL — ABNORMAL LOW (ref 8.9–10.3)
GFR calc Af Amer: 13 mL/min — ABNORMAL LOW (ref >60)
GFR, EST NON AFRICAN AMERICAN: 11 mL/min — AB (ref >60)
GLUCOSE: 199 mg/dL — AB (ref 65–99)
POTASSIUM: 4 mmol/L (ref 3.5–5.1)
Sodium: 137 mmol/L (ref 135–145)
TOTAL PROTEIN: 7.5 g/dL (ref 6.5–8.1)

## 2015-12-08 LAB — CREATININE, SERUM
CREATININE: 4.25 mg/dL — AB (ref 0.44–1.00)
GFR, EST AFRICAN AMERICAN: 13 mL/min — AB (ref >60)
GFR, EST NON AFRICAN AMERICAN: 11 mL/min — AB (ref >60)

## 2015-12-08 LAB — GLUCOSE, CAPILLARY
GLUCOSE-CAPILLARY: 192 mg/dL — AB (ref 65–99)
GLUCOSE-CAPILLARY: 178 mg/dL — AB (ref 65–99)
Glucose-Capillary: 175 mg/dL — ABNORMAL HIGH (ref 65–99)
Glucose-Capillary: 160 mg/dL — ABNORMAL HIGH (ref 65–99)

## 2015-12-08 LAB — LIPASE, BLOOD
LIPASE: 154 U/L — AB (ref 11–51)
Lipase: 98 U/L — ABNORMAL HIGH (ref 11–51)

## 2015-12-08 MED ORDER — ALBUTEROL SULFATE (2.5 MG/3ML) 0.083% IN NEBU: 2.5000 mg | INHALATION_SOLUTION | Freq: Four times a day (QID) | RESPIRATORY_TRACT | Status: DC | PRN

## 2015-12-08 MED ORDER — MORPHINE SULFATE (PF) 2 MG/ML IV SOLN
2.0000 mg | INTRAVENOUS | Status: DC | PRN
  Administered 2015-12-08 (×2): 2 mg via INTRAVENOUS
  Filled 2015-12-08 (×3): qty 1

## 2015-12-08 MED ORDER — ONDANSETRON HCL 4 MG/2ML IJ SOLN
4.0000 mg | Freq: Once | INTRAMUSCULAR | Status: AC
  Administered 2015-12-08: 4 mg via INTRAVENOUS
  Filled 2015-12-08: qty 2

## 2015-12-08 MED ORDER — SODIUM CHLORIDE 0.9 % IV BOLUS (SEPSIS)
1000.0000 mL | Freq: Once | INTRAVENOUS | Status: DC
  Administered 2015-12-08: 1000 mL via INTRAVENOUS

## 2015-12-08 MED ORDER — PIPERACILLIN-TAZOBACTAM 3.375 G IVPB: 3.3750 g | Freq: Once | INTRAVENOUS | Status: DC

## 2015-12-08 MED ORDER — ALPRAZOLAM 0.5 MG PO TABS: 0.5000 mg | ORAL_TABLET | Freq: Three times a day (TID) | ORAL | Status: DC | PRN

## 2015-12-08 MED ORDER — MORPHINE SULFATE (PF) 4 MG/ML IV SOLN
6.0000 mg | Freq: Once | INTRAVENOUS | Status: AC
  Administered 2015-12-08: 6 mg via INTRAVENOUS
  Filled 2015-12-08: qty 2

## 2015-12-08 MED ORDER — INSULIN ASPART 100 UNIT/ML ~~LOC~~ SOLN
0.0000 [IU] | Freq: Three times a day (TID) | SUBCUTANEOUS | Status: DC
  Administered 2015-12-08 – 2015-12-09 (×5): 2 [IU] via SUBCUTANEOUS
  Administered 2015-12-09 – 2015-12-10 (×2): 1 [IU] via SUBCUTANEOUS
  Administered 2015-12-10: 2 [IU] via SUBCUTANEOUS

## 2015-12-08 MED ORDER — PIPERACILLIN SOD-TAZOBACTAM SO 2.25 (2-0.25) G IV SOLR
2.2500 g | Freq: Three times a day (TID) | INTRAVENOUS | Status: DC
  Administered 2015-12-08 – 2015-12-10 (×7): 2.25 g via INTRAVENOUS
  Filled 2015-12-08 (×10): qty 2.25

## 2015-12-08 MED ORDER — AMLODIPINE BESYLATE 5 MG PO TABS
10.0000 mg | ORAL_TABLET | Freq: Every day | ORAL | Status: DC
  Administered 2015-12-08 – 2015-12-10 (×3): 10 mg via ORAL
  Filled 2015-12-08 (×3): qty 2

## 2015-12-08 MED ORDER — PIPERACILLIN-TAZOBACTAM 3.375 G IVPB
3.3750 g | Freq: Three times a day (TID) | INTRAVENOUS | Status: DC
  Administered 2015-12-08: 3.375 g via INTRAVENOUS

## 2015-12-08 MED ORDER — CETYLPYRIDINIUM CHLORIDE 0.05 % MT LIQD
7.0000 mL | Freq: Two times a day (BID) | OROMUCOSAL | Status: DC
  Administered 2015-12-08 – 2015-12-10 (×5): 7 mL via OROMUCOSAL

## 2015-12-08 MED ORDER — INSULIN ASPART PROT & ASPART (70-30 MIX) 100 UNIT/ML ~~LOC~~ SUSP
60.0000 [IU] | Freq: Two times a day (BID) | SUBCUTANEOUS | Status: DC
  Filled 2015-12-08: qty 10

## 2015-12-08 MED ORDER — OXYCODONE HCL 5 MG PO TABS
10.0000 mg | ORAL_TABLET | Freq: Four times a day (QID) | ORAL | Status: DC | PRN
  Administered 2015-12-10: 10 mg via ORAL
  Filled 2015-12-08 (×2): qty 2

## 2015-12-08 MED ORDER — SODIUM CHLORIDE 0.9 % IV SOLN
INTRAVENOUS | Status: DC
  Administered 2015-12-08 (×2): via INTRAVENOUS

## 2015-12-08 MED ORDER — METOPROLOL TARTRATE 25 MG PO TABS
25.0000 mg | ORAL_TABLET | Freq: Two times a day (BID) | ORAL | Status: DC
  Administered 2015-12-08 – 2015-12-10 (×5): 25 mg via ORAL
  Filled 2015-12-08 (×5): qty 1

## 2015-12-08 MED ORDER — DIPHENHYDRAMINE HCL 25 MG PO CAPS: 25.0000 mg | ORAL_CAPSULE | Freq: Four times a day (QID) | ORAL | Status: DC | PRN

## 2015-12-08 NOTE — Progress Notes (Signed)
Subjective: She was discharged home several days ago and has come back with abdominal pain. Apparently she had been in her usual state of poor health when she developed fairly sudden onset of epigastric pain. She says she feels a little better. She had nausea as well. CT of the abdomen showed some soft tissue stranding but also a distended gallbladder and ultrasound is pending.  Objective: Vital signs in last 24 hours: Temp:  [97.6 F (36.4 C)-98.4 F (36.9 C)] 98.4 F (36.9 C) (12/19 0517) Pulse Rate:  [60-75] 70 (12/19 0346) Resp:  [16-22] 22 (12/19 0230) BP: (142-173)/(68-90) 142/68 mmHg (12/19 0517) SpO2:  [91 %-100 %] 100 % (12/19 0517) Weight:  [186.882 kg (412 lb)] 186.882 kg (412 lb) (12/19 0517) Weight change:  Last BM Date: 12/07/15  Intake/Output from previous day:    PHYSICAL EXAM General appearance: alert, cooperative, moderate distress and morbidly obese Resp: She has diminished breath sounds bilaterally Cardio: regular rate and rhythm, S1, S2 normal, no murmur, click, rub or gallop GI: She is tender in the epigastric region. I don't see anything that looks like cellulitis of her abdominal wall Extremities: She has chronic lymphedema of the lower extremities  Lab Results:  Results for orders placed or performed during the hospital encounter of 12/07/15 (from the past 48 hour(s))  Comprehensive metabolic panel     Status: Abnormal   Collection Time: 12/07/15 10:22 PM  Result Value Ref Range   Sodium 137 135 - 145 mmol/L   Potassium 4.0 3.5 - 5.1 mmol/L   Chloride 99 (L) 101 - 111 mmol/L   CO2 29 22 - 32 mmol/L   Glucose, Bld 199 (H) 65 - 99 mg/dL   BUN 32 (H) 6 - 20 mg/dL   Creatinine, Ser 4.37 (H) 0.44 - 1.00 mg/dL   Calcium 8.7 (L) 8.9 - 10.3 mg/dL   Total Protein 7.5 6.5 - 8.1 g/dL   Albumin 3.0 (L) 3.5 - 5.0 g/dL   AST 15 15 - 41 U/L   ALT 7 (L) 14 - 54 U/L   Alkaline Phosphatase 131 (H) 38 - 126 U/L   Total Bilirubin 0.8 0.3 - 1.2 mg/dL   GFR calc non  Af Amer 11 (L) >60 mL/min   GFR calc Af Amer 13 (L) >60 mL/min    Comment: (NOTE) The eGFR has been calculated using the CKD EPI equation. This calculation has not been validated in all clinical situations. eGFR's persistently <60 mL/min signify possible Chronic Kidney Disease.    Anion gap 9 5 - 15  Lipase, blood     Status: Abnormal   Collection Time: 12/07/15 10:22 PM  Result Value Ref Range   Lipase 154 (H) 11 - 51 U/L  CBC with Differential     Status: Abnormal   Collection Time: 12/07/15 10:22 PM  Result Value Ref Range   WBC 10.5 4.0 - 10.5 K/uL   RBC 3.63 (L) 3.87 - 5.11 MIL/uL   Hemoglobin 10.2 (L) 12.0 - 15.0 g/dL   HCT 32.3 (L) 36.0 - 46.0 %   MCV 89.0 78.0 - 100.0 fL   MCH 28.1 26.0 - 34.0 pg   MCHC 31.6 30.0 - 36.0 g/dL   RDW 17.8 (H) 11.5 - 15.5 %   Platelets 268 150 - 400 K/uL   Neutrophils Relative % 83 %   Neutro Abs 8.7 (H) 1.7 - 7.7 K/uL   Lymphocytes Relative 11 %   Lymphs Abs 1.1 0.7 - 4.0 K/uL   Monocytes  Relative 5 %   Monocytes Absolute 0.6 0.1 - 1.0 K/uL   Eosinophils Relative 1 %   Eosinophils Absolute 0.1 0.0 - 0.7 K/uL   Basophils Relative 0 %   Basophils Absolute 0.0 0.0 - 0.1 K/uL  CBC     Status: Abnormal   Collection Time: 12/08/15  5:45 AM  Result Value Ref Range   WBC 12.8 (H) 4.0 - 10.5 K/uL   RBC 3.63 (L) 3.87 - 5.11 MIL/uL   Hemoglobin 10.1 (L) 12.0 - 15.0 g/dL   HCT 32.0 (L) 36.0 - 46.0 %   MCV 88.2 78.0 - 100.0 fL   MCH 27.8 26.0 - 34.0 pg   MCHC 31.6 30.0 - 36.0 g/dL   RDW 17.5 (H) 11.5 - 15.5 %   Platelets 271 150 - 400 K/uL  Creatinine, serum     Status: Abnormal   Collection Time: 12/08/15  5:45 AM  Result Value Ref Range   Creatinine, Ser 4.25 (H) 0.44 - 1.00 mg/dL   GFR calc non Af Amer 11 (L) >60 mL/min   GFR calc Af Amer 13 (L) >60 mL/min    Comment: (NOTE) The eGFR has been calculated using the CKD EPI equation. This calculation has not been validated in all clinical situations. eGFR's persistently <60 mL/min  signify possible Chronic Kidney Disease.   Lipase, blood     Status: Abnormal   Collection Time: 12/08/15  5:45 AM  Result Value Ref Range   Lipase 98 (H) 11 - 51 U/L  Glucose, capillary     Status: Abnormal   Collection Time: 12/08/15  7:31 AM  Result Value Ref Range   Glucose-Capillary 175 (H) 65 - 99 mg/dL    ABGS No results for input(s): PHART, PO2ART, TCO2, HCO3 in the last 72 hours.  Invalid input(s): PCO2 CULTURES Recent Results (from the past 240 hour(s))  MRSA PCR Screening     Status: None   Collection Time: 11/28/15  2:15 PM  Result Value Ref Range Status   MRSA by PCR NEGATIVE NEGATIVE Final    Comment:        The GeneXpert MRSA Assay (FDA approved for NASAL specimens only), is one component of a comprehensive MRSA colonization surveillance program. It is not intended to diagnose MRSA infection nor to guide or monitor treatment for MRSA infections.    Studies/Results: Ct Abdomen Pelvis Wo Contrast  12/08/2015  CLINICAL DATA:  49 year old female with epigastric pain. Elevated lipase. EXAM: CT ABDOMEN AND PELVIS WITHOUT CONTRAST TECHNIQUE: Multidetector CT imaging of the abdomen and pelvis was performed following the standard protocol without IV contrast. COMPARISON:  CT dated 12/02/2014 FINDINGS: Evaluation of this exam is limited in the absence of intravenous contrast. Evaluation is also limited due to patient's body habitus. Partially visualized small left pleural effusion. Bibasilar linear atelectasis/scarring. No intra-abdominal free air. No free fluid. The liver appears grossly unremarkable. The gallbladder is mildly distended. No calcified stone identified. Ultrasound is recommended for better evaluation of the gallbladder. The pancreas, spleen, and adrenal glands appear unremarkable. There is no hydronephrosis or nephrolithiasis. The urinary bladder is partially distended and appears grossly unremarkable. Hysterectomy. Sigmoid diverticulosis without active  inflammation. There is moderate stool throughout the colon. No evidence of bowel obstruction. The appendix is not visualized with certainty. No inflammatory changes identified in the right lower quadrant. The abdominal aorta and IVC appear grossly unremarkable. No portal venous gas identified. Stable appearing top-normal retroperitoneal lymph nodes. Midline vertical anterior pelvic wall incisional scar. There is  diffuse subcutaneous soft tissue stranding predominantly involving the anterior pelvic wall. No drainable fluid collection. Degenerative changes of the spine. No acute fractures. IMPRESSION: Mildly distended gallbladder. Ultrasound is recommended for further evaluation. Constipation.  No evidence of bowel obstruction or inflammation. Sigmoid diverticulosis. No hydronephrosis or nephrolithiasis. Diffuse subcutaneous soft tissue stranding and edema predominantly involving the anterior abdominal wall. No drainable fluid collection/abscess. Electronically Signed   By: Anner Crete M.D.   On: 12/08/2015 03:15    Medications:  Prior to Admission:  Prescriptions prior to admission  Medication Sig Dispense Refill Last Dose  . albuterol (PROVENTIL) (2.5 MG/3ML) 0.083% nebulizer solution Take 2.5 mg by nebulization every 6 (six) hours as needed for wheezing or shortness of breath.   11/09/2015  . ALPRAZolam (XANAX) 0.5 MG tablet Take 0.5 mg by mouth 3 (three) times daily as needed for anxiety or sleep.    Past Month  . amLODipine (NORVASC) 10 MG tablet Take 10 mg by mouth daily.   12/06/2015  . insulin NPH-insulin regular (NOVOLIN 70/30) (70-30) 100 UNIT/ML injection Inject 60 Units into the skin 2 (two) times daily.   12/04/2015  . metoprolol (LOPRESSOR) 50 MG tablet Take 0.5 tablets (25 mg total) by mouth 2 (two) times daily. 30 tablet 12 12/06/2015 at 1800  . Oxycodone HCl 10 MG TABS Take 10 mg by mouth 4 (four) times daily as needed (pain).    12/07/2015  . acetaminophen (TYLENOL) 325 MG tablet  Take 650 mg by mouth every 6 (six) hours as needed. fever   unknown  . diphenhydrAMINE (BENADRYL) 25 mg capsule Take 25 mg by mouth every 6 (six) hours as needed for allergies.   More Than A Month   Scheduled: . amLODipine  10 mg Oral Daily  . antiseptic oral rinse  7 mL Mouth Rinse BID  . insulin aspart  0-9 Units Subcutaneous TID WC  . insulin aspart protamine- aspart  60 Units Subcutaneous BID WC  . metoprolol  25 mg Oral BID   Continuous: . sodium chloride 10 mL/hr at 12/08/15 0515   WNU:[id number], ALPRAZolam, diphenhydrAMINE, morphine injection, oxyCODONE  Assesment: She was admitted with abdominal pain. This may be related to her gallbladder. She is a very poor surgical candidate so I think she would be treated conservatively.  She has obesity hypoventilation and has been on BiPAP at home.  She has morbid obesity  She has recent onset of dialysis dependent renal failure  She has diabetes which is fairly well controlled  She has hypertension which is doing okay  She has chronic lymphedema stable  She had respiratory arrest when she received sedation for permacath placement Active Problems:   Lymphedema of lower extremity   Diabetes mellitus ([place])   HTN (hypertension)   Abdominal pain    Plan: Ultrasound of the abdomen. I will treat her with IV antibiotics. Nephrology consultation. Continue the rest of her treatments      Delane Wessinger L 12/08/2015, 8:38 AM

## 2015-12-08 NOTE — Progress Notes (Signed)
ANTIBIOTIC CONSULT NOTE - INITIAL  Pharmacy Consult for Zosyn Indication: intra abdominal infection  Allergies  Allergen Reactions  . Contrast Media [Iodinated Diagnostic Agents] Anaphylaxis, Hives and Swelling    Dye for cardiac cath. Tongue swells  . Pineapple Anaphylaxis, Itching and Swelling  . Vancomycin Nausea And Vomiting    Infusion "made me feel like I was dying" had to be readmitted to hospital  . Pneumococcal Vaccines Swelling    Turns skin black, and bodily swelling  . Shellfish Allergy Itching and Swelling    Facial swelling     Patient Measurements: Height: _0  (165.1 cm) Weight: (!) 412 lb (186.882 kg) IBW/kg (Calculated) : 57  Vital Signs: Temp: 98.4 F (36.9 C) (12/19 0517) Temp Source: Oral (12/19 0517) BP: 142/68 mmHg (12/19 0517) Pulse Rate: 70 (12/19 0346) Intake/Output from previous day:   Intake/Output from this shift: Total I/O In: -  Out: 1 [Urine:1]  Labs:  Recent Labs  12/05/15 1155 12/07/15 2222 12/08/15 0545  WBC 8.9 10.5 12.8*  HGB 9.4* 10.2* 10.1*  PLT 219 268 271  CREATININE 3.89* 4.37* 4.25*   Estimated Creatinine Clearance: 27.6 mL/min (by C-G formula based on Cr of 4.25). No results for input(s): VANCOTROUGH, VANCOPEAK, VANCORANDOM, GENTTROUGH, GENTPEAK, GENTRANDOM, TOBRATROUGH, TOBRAPEAK, TOBRARND, AMIKACINPEAK, AMIKACINTROU, AMIKACIN in the last 72 hours.   Microbiology: Recent Results (from the past 720 hour(s))  Blood culture (routine x 2)     Status: None   Collection Time: 11/13/15  3:31 PM  Result Value Ref Range Status   Specimen Description BLOOD LEFT ARM  Final   Special Requests   Final    BOTTLES DRAWN AEROBIC AND ANAEROBIC AEB=10CC ANA=6CC   Culture NO GROWTH 5 DAYS  Final   Report Status 11/18/2015 FINAL  Final  Blood culture (routine x 2)     Status: None   Collection Time: 11/13/15  3:37 PM  Result Value Ref Range Status   Specimen Description BLOOD RIGHT ARM  Final   Special Requests   Final   BOTTLES DRAWN AEROBIC AND ANAEROBIC AEB 6CC ANA 4CC   Culture NO GROWTH 5 DAYS  Final   Report Status 11/18/2015 FINAL  Final  MRSA PCR Screening     Status: None   Collection Time: 11/18/15 11:45 AM  Result Value Ref Range Status   MRSA by PCR NEGATIVE NEGATIVE Final    Comment:        The GeneXpert MRSA Assay (FDA approved for NASAL specimens only), is one component of a comprehensive MRSA colonization surveillance program. It is not intended to diagnose MRSA infection nor to guide or monitor treatment for MRSA infections.   MRSA PCR Screening     Status: None   Collection Time: 11/28/15  2:15 PM  Result Value Ref Range Status   MRSA by PCR NEGATIVE NEGATIVE Final    Comment:        The GeneXpert MRSA Assay (FDA approved for NASAL specimens only), is one component of a comprehensive MRSA colonization surveillance program. It is not intended to diagnose MRSA infection nor to guide or monitor treatment for MRSA infections.     Medical History: Past Medical History  Diagnosis Date  . Diabetes mellitus   . Chronic pain   . Lymph edema   . Cellulitis   . Chiari malformation     s/p surgery  . S/P colonoscopy 05/26/2007    Dr. Laural Golden sigmoid diverticulosis random biopsies benign  . CAD (coronary artery disease)   .  Hypercholesterolemia   . Hypertension   . COPD (chronic obstructive pulmonary disease) ([place])   . S/P endoscopy 05/01/2009    Dr. Penelope Coop pill-induced esophageal ulcerations distal to midesophagus, 2 small ulcers in the antrum of the stomach  . Fibromyalgia   . On home O2   . Obesity hypoventilation syndrome ([place])   . Hx of echocardiogram 10/2011    EF 55-60%    Medications:  Scheduled:  . amLODipine  10 mg Oral Daily  . antiseptic oral rinse  7 mL Mouth Rinse BID  . insulin aspart  0-9 Units Subcutaneous TID WC  . insulin aspart protamine- aspart  60 Units Subcutaneous BID WC  . metoprolol  25 mg Oral BID  . piperacillin-tazobactam (ZOSYN)  IV   3.375 g Intravenous 3 times per day   Assessment: 49 yo readmitted with abdominal pain, nausea and vomiting. CT abdomen showed some soft tissue stranding but also a distended gallbladder. Ultrasound is pending. ESRD on HD MWF. She is afebrile, wbc elevated 12.8. Empiric tx with Zosyn  Goal of Therapy:  resolution of infection  Plan:  Zosyn 2.25gm IV q8h Follow up culture results  Monitor V/S and labs  Isac Sarna, BS Vena [place], BCPS Clinical Pharmacist Pager [phone] 12/08/2015,9:54 AM

## 2015-12-08 NOTE — Consult Note (Signed)
Reason for Consult: Renal failure Referring Physician: [name] is an 49 y.o. female.  HPI: She is a patient with history of hypertension, diabetes, sleep apnea, lymphedema, renal failure recently started on dialysis. Presently patient came with some abdominal pain, nausea and vomiting for the last 24 hours. Patient had similar episodes the last time she was here but was thought to be secondary to uremia improved after dialysis. Presently she started having problem since last night. When she was evaluated patient was found to have slightly elevated lipase and some mild distention of gallbladder. Presently patient denies any difficulty in breathing.  Past Medical History  Diagnosis Date  . Diabetes mellitus   . Chronic pain   . Lymph edema   . Cellulitis   . Chiari malformation     s/p surgery  . S/P colonoscopy 05/26/2007    Dr. Laural Golden sigmoid diverticulosis random biopsies benign  . CAD (coronary artery disease)   . Hypercholesterolemia   . Hypertension   . COPD (chronic obstructive pulmonary disease) ([place])   . S/P endoscopy 05/01/2009    Dr. Penelope Coop pill-induced esophageal ulcerations distal to midesophagus, 2 small ulcers in the antrum of the stomach  . Fibromyalgia   . On home O2   . Obesity hypoventilation syndrome ([place])   . Hx of echocardiogram 10/2011    EF 55-60%    Past Surgical History  Procedure Laterality Date  . Cesarean section       x 2  . Abdominal hysterectomy    . Tonsillectomy    . Adenoidectomy    . Thyroidectomy, partial    . Craniectomy suboccipital w/ cervical laminectomy / chiari    . Portacath placement  07/05/2012    Procedure: INSERTION PORT-A-CATH;  Surgeon: Donato Heinz, MD;  Location: [facility];  Service: General;  Laterality: Left;  subclavian    Family History  Problem Relation Age of Onset  . Colon cancer Mother 46  . Stroke Mother 107  . Coronary artery disease Mother     Social History:  reports that she has never  smoked. She has never used smokeless tobacco. She reports that she does not drink alcohol or use illicit drugs.  Allergies:  Allergies  Allergen Reactions  . Contrast Media [Iodinated Diagnostic Agents] Anaphylaxis, Hives and Swelling    Dye for cardiac cath. Tongue swells  . Pineapple Anaphylaxis, Itching and Swelling  . Vancomycin Nausea And Vomiting    Infusion "made me feel like I was dying" had to be readmitted to hospital  . Pneumococcal Vaccines Swelling    Turns skin black, and bodily swelling  . Shellfish Allergy Itching and Swelling    Facial swelling     Medications: I have reviewed the patient's current medications.  Results for orders placed or performed during the hospital encounter of 12/07/15 (from the past 48 hour(s))  Comprehensive metabolic panel     Status: Abnormal   Collection Time: 12/07/15 10:22 PM  Result Value Ref Range   Sodium 137 135 - 145 mmol/L   Potassium 4.0 3.5 - 5.1 mmol/L   Chloride 99 (L) 101 - 111 mmol/L   CO2 29 22 - 32 mmol/L   Glucose, Bld 199 (H) 65 - 99 mg/dL   BUN 32 (H) 6 - 20 mg/dL   Creatinine, Ser 4.37 (H) 0.44 - 1.00 mg/dL   Calcium 8.7 (L) 8.9 - 10.3 mg/dL   Total Protein 7.5 6.5 - 8.1 g/dL   Albumin 3.0 (L)  3.5 - 5.0 g/dL   AST 15 15 - 41 U/L   ALT 7 (L) 14 - 54 U/L   Alkaline Phosphatase 131 (H) 38 - 126 U/L   Total Bilirubin 0.8 0.3 - 1.2 mg/dL   GFR calc non Af Amer 11 (L) >60 mL/min   GFR calc Af Amer 13 (L) >60 mL/min    Comment: (NOTE) The eGFR has been calculated using the CKD EPI equation. This calculation has not been validated in all clinical situations. eGFR's persistently <60 mL/min signify possible Chronic Kidney Disease.    Anion gap 9 5 - 15  Lipase, blood     Status: Abnormal   Collection Time: 12/07/15 10:22 PM  Result Value Ref Range   Lipase 154 (H) 11 - 51 U/L  CBC with Differential     Status: Abnormal   Collection Time: 12/07/15 10:22 PM  Result Value Ref Range   WBC 10.5 4.0 - 10.5 K/uL    RBC 3.63 (L) 3.87 - 5.11 MIL/uL   Hemoglobin 10.2 (L) 12.0 - 15.0 g/dL   HCT 32.3 (L) 36.0 - 46.0 %   MCV 89.0 78.0 - 100.0 fL   MCH 28.1 26.0 - 34.0 pg   MCHC 31.6 30.0 - 36.0 g/dL   RDW 17.8 (H) 11.5 - 15.5 %   Platelets 268 150 - 400 K/uL   Neutrophils Relative % 83 %   Neutro Abs 8.7 (H) 1.7 - 7.7 K/uL   Lymphocytes Relative 11 %   Lymphs Abs 1.1 0.7 - 4.0 K/uL   Monocytes Relative 5 %   Monocytes Absolute 0.6 0.1 - 1.0 K/uL   Eosinophils Relative 1 %   Eosinophils Absolute 0.1 0.0 - 0.7 K/uL   Basophils Relative 0 %   Basophils Absolute 0.0 0.0 - 0.1 K/uL  CBC     Status: Abnormal   Collection Time: 12/08/15  5:45 AM  Result Value Ref Range   WBC 12.8 (H) 4.0 - 10.5 K/uL   RBC 3.63 (L) 3.87 - 5.11 MIL/uL   Hemoglobin 10.1 (L) 12.0 - 15.0 g/dL   HCT 32.0 (L) 36.0 - 46.0 %   MCV 88.2 78.0 - 100.0 fL   MCH 27.8 26.0 - 34.0 pg   MCHC 31.6 30.0 - 36.0 g/dL   RDW 17.5 (H) 11.5 - 15.5 %   Platelets 271 150 - 400 K/uL  Creatinine, serum     Status: Abnormal   Collection Time: 12/08/15  5:45 AM  Result Value Ref Range   Creatinine, Ser 4.25 (H) 0.44 - 1.00 mg/dL   GFR calc non Af Amer 11 (L) >60 mL/min   GFR calc Af Amer 13 (L) >60 mL/min    Comment: (NOTE) The eGFR has been calculated using the CKD EPI equation. This calculation has not been validated in all clinical situations. eGFR's persistently <60 mL/min signify possible Chronic Kidney Disease.   Lipase, blood     Status: Abnormal   Collection Time: 12/08/15  5:45 AM  Result Value Ref Range   Lipase 98 (H) 11 - 51 U/L  Glucose, capillary     Status: Abnormal   Collection Time: 12/08/15  7:31 AM  Result Value Ref Range   Glucose-Capillary 175 (H) 65 - 99 mg/dL    Ct Abdomen Pelvis Wo Contrast  12/08/2015  CLINICAL DATA:  49 year old female with epigastric pain. Elevated lipase. EXAM: CT ABDOMEN AND PELVIS WITHOUT CONTRAST TECHNIQUE: Multidetector CT imaging of the abdomen and pelvis was performed following the  standard protocol without IV contrast. COMPARISON:  CT dated 12/02/2014 FINDINGS: Evaluation of this exam is limited in the absence of intravenous contrast. Evaluation is also limited due to patient's body habitus. Partially visualized small left pleural effusion. Bibasilar linear atelectasis/scarring. No intra-abdominal free air. No free fluid. The liver appears grossly unremarkable. The gallbladder is mildly distended. No calcified stone identified. Ultrasound is recommended for better evaluation of the gallbladder. The pancreas, spleen, and adrenal glands appear unremarkable. There is no hydronephrosis or nephrolithiasis. The urinary bladder is partially distended and appears grossly unremarkable. Hysterectomy. Sigmoid diverticulosis without active inflammation. There is moderate stool throughout the colon. No evidence of bowel obstruction. The appendix is not visualized with certainty. No inflammatory changes identified in the right lower quadrant. The abdominal aorta and IVC appear grossly unremarkable. No portal venous gas identified. Stable appearing top-normal retroperitoneal lymph nodes. Midline vertical anterior pelvic wall incisional scar. There is diffuse subcutaneous soft tissue stranding predominantly involving the anterior pelvic wall. No drainable fluid collection. Degenerative changes of the spine. No acute fractures. IMPRESSION: Mildly distended gallbladder. Ultrasound is recommended for further evaluation. Constipation.  No evidence of bowel obstruction or inflammation. Sigmoid diverticulosis. No hydronephrosis or nephrolithiasis. Diffuse subcutaneous soft tissue stranding and edema predominantly involving the anterior abdominal wall. No drainable fluid collection/abscess. Electronically Signed   By: Anner Crete M.D.   On: 12/08/2015 03:15    Review of Systems  Constitutional: Negative for fever and chills.  Respiratory: Negative for cough and shortness of breath.   Cardiovascular:  Negative for orthopnea.  Gastrointestinal: Positive for nausea, vomiting and abdominal pain. Negative for diarrhea.   Blood pressure 142/68, pulse 70, temperature 98.4 F (36.9 C), temperature source Oral, resp. rate 22, height 5' 5" (1.651 m), weight 412 lb (186.882 kg), SpO2 100 %. Physical Exam  Constitutional: She is oriented to person, place, and time. No distress.  Eyes: No scleral icterus.  Neck: No JVD present.  Cardiovascular: Normal rate and regular rhythm.   No murmur heard. GI: She exhibits distension. There is no tenderness. There is no rebound.  Musculoskeletal: She exhibits edema.  Neurological: She is alert and oriented to person, place, and time.    Assessment/Plan: Problem #1 nausea and vomiting with abdominal pain possibly secondary to cholecystitis. Patient presently afebrile but her white blood cell count is high. Problem #2 renal failure: Status post hemodialysis on Friday. Presently her BUN and creatinine is within reasonable range potassium is normal. Problem #3 anemia: Her hemoglobin is within our target goal Problem #4 hypertension: Her blood pressure is reasonably controlled Problem #5 history of diabetes Problem #6 sleep apnea Problem #7 fluid management: Patient doesn't have any sign of fluid overload. Plan: Patient doesn't require dialysis today 2] we'll make arrangements for patient to get dialysis tomorrow which will put her on her regular dialysis day. 3] will check basic metabolic panel and phosphorus in the morning.  Yatzari Jonsson S 12/08/2015, 8:06 AM

## 2015-12-08 NOTE — Clinical Social Work Note (Signed)
CSW notified Theressa Stamps that pt was in hospital and would not be at first outpatient appointment tomorrow. Pt requested that CSW provide this information to [place]. No other issues reported. She states things were going fairly well at home over the weekend.   [name], [place]

## 2015-12-08 NOTE — H&P (Signed)
PCP:   Alonza Bogus, MD   Chief Complaint:  Abdominal pain  HPI: 49 year old female who  has a past medical history of Diabetes mellitus; Chronic pain; Lymph edema; Cellulitis; Chiari malformation; S/P colonoscopy (05/26/2007); CAD (coronary artery disease); Hypercholesterolemia; Hypertension; COPD (chronic obstructive pulmonary disease) ([place]); S/P endoscopy (05/01/2009); Fibromyalgia; On home O2; Obesity hypoventilation syndrome ([place]); and echocardiogram (10/2011). Patient has end-stage renal disease and is on hemodialysis 2000 Saturday. Last emesis was on Friday. They came to the ED with complaints of epigastric pain. Also had several episodes of vomiting. She says that first episode of vomitus was bloody. No diarrhea. Denies any chest pain or shortness of breath. No recent use of NSAIDs or alcohol use. In the ED she was found to have elevated lipase of 154. CT scan of the abdomen and pelvis was done which showed mildly distended gallbladder with no calcified stone. Pancreas normal.  Allergies:   Allergies  Allergen Reactions  . Contrast Media [Iodinated Diagnostic Agents] Anaphylaxis, Hives and Swelling    Dye for cardiac cath. Tongue swells  . Pineapple Anaphylaxis, Itching and Swelling  . Vancomycin Nausea And Vomiting    Infusion "made me feel like I was dying" had to be readmitted to hospital  . Pneumococcal Vaccines Swelling    Turns skin black, and bodily swelling  . Shellfish Allergy Itching and Swelling    Facial swelling       Past Medical History  Diagnosis Date  . Diabetes mellitus   . Chronic pain   . Lymph edema   . Cellulitis   . Chiari malformation     s/p surgery  . S/P colonoscopy 05/26/2007    Dr. Laural Golden sigmoid diverticulosis random biopsies benign  . CAD (coronary artery disease)   . Hypercholesterolemia   . Hypertension   . COPD (chronic obstructive pulmonary disease) ([place])   . S/P endoscopy 05/01/2009    Dr. Penelope Coop pill-induced esophageal  ulcerations distal to midesophagus, 2 small ulcers in the antrum of the stomach  . Fibromyalgia   . On home O2   . Obesity hypoventilation syndrome ([place])   . Hx of echocardiogram 10/2011    EF 55-60%    Past Surgical History  Procedure Laterality Date  . Cesarean section       x 2  . Abdominal hysterectomy    . Tonsillectomy    . Adenoidectomy    . Thyroidectomy, partial    . Craniectomy suboccipital w/ cervical laminectomy / chiari    . Portacath placement  07/05/2012    Procedure: INSERTION PORT-A-CATH;  Surgeon: Donato Heinz, MD;  Location: [facility];  Service: General;  Laterality: Left;  subclavian    Prior to Admission medications   Medication Sig Start Date End Date Taking? Authorizing Provider  albuterol (PROVENTIL) (2.5 MG/3ML) 0.083% nebulizer solution Take 2.5 mg by nebulization every 6 (six) hours as needed for wheezing or shortness of breath.   Yes Historical Provider, MD  ALPRAZolam Duanne Moron) 0.5 MG tablet Take 0.5 mg by mouth 3 (three) times daily as needed for anxiety or sleep.    Yes Historical Provider, MD  amLODipine (NORVASC) 10 MG tablet Take 10 mg by mouth daily.   Yes Historical Provider, MD  insulin NPH-insulin regular (NOVOLIN 70/30) (70-30) 100 UNIT/ML injection Inject 60 Units into the skin 2 (two) times daily.   Yes Historical Provider, MD  metoprolol (LOPRESSOR) 50 MG tablet Take 0.5 tablets (25 mg total) by mouth 2 (two) times daily. 12/04/15  Yes  [name], MD  Oxycodone HCl 10 MG TABS Take 10 mg by mouth 4 (four) times daily as needed (pain).    Yes Historical Provider, MD  acetaminophen (TYLENOL) 325 MG tablet Take 650 mg by mouth every 6 (six) hours as needed. fever    Historical Provider, MD  diphenhydrAMINE (BENADRYL) 25 mg capsule Take 25 mg by mouth every 6 (six) hours as needed for allergies.    Historical Provider, MD    Social History:  reports that she has never smoked. She has never used smokeless tobacco. She reports that she does not  drink alcohol or use illicit drugs.  Family History  Problem Relation Age of Onset  . Colon cancer Mother 68  . Stroke Mother 43  . Coronary artery disease Mother     Danley Danker Weights   12/07/15 2207  Weight: 186.882 kg (412 lb)    All the positives are listed in BOLD  Review of Systems:  HEENT: Headache, blurred vision, runny nose, sore throat Neck: Hypothyroidism, hyperthyroidism,,lymphadenopathy Chest : Shortness of breath, history of COPD, Asthma Heart : Chest pain, history of coronary arterey disease GI:  Nausea, vomiting, diarrhea, constipation, GERD GU: Dysuria, urgency, frequency of urination, hematuria Neuro: Stroke, seizures, syncope Psych: Depression, anxiety, hallucinations   Physical Exam: Blood pressure 144/88, pulse 70, temperature 97.6 F (36.4 C), temperature source Oral, resp. rate 22, height _0  (1.651 m), weight 186.882 kg (412 lb), SpO2 95 %. Constitutional:   Patient is a morbidly obese female* in no acute distress and cooperative with exam. Head: Normocephalic and atraumatic Mouth: Mucus membranes moist Eyes: PERRL, EOMI, conjunctivae normal Neck: Supple, No Thyromegaly Cardiovascular: RRR, S1 normal, S2 normal Pulmonary/Chest: CTAB, no wheezes, rales, or rhonchi Abdominal: Soft. Positive epigastric tenderness non-distended, bowel sounds are normal, no masses, organomegaly, or guarding present.  Neurological: A&O x3, Strength is normal and symmetric bilaterally, cranial nerve II-XII are grossly intact, no focal motor deficit, sensory intact to light touch bilaterally.  Extremities : Marked lymphedema noted in the left lower extremity  Labs on Admission:  Basic Metabolic Panel:  Recent Labs Lab 12/02/15 0528 12/03/15 0631 12/04/15 1529 12/05/15 1155 12/07/15 2222  NA 134* 136 134* 133* 137  K 3.5 3.7 3.8 4.3 4.0  CL 98* 101 95* 98* 99*  CO2 31 32 _1 GLUCOSE 185* 162* 194* 225* 199*  BUN 49* 32* 37* 29* 32*  CREATININE 3.64* 2.66*  4.09* 3.89* 4.37*  CALCIUM 8.4* 8.2* 8.5* 8.2* 8.7*  MG 2.0  --   --   --   --   PHOS 3.7 2.8 3.5 3.2  --    Liver Function Tests:  Recent Labs Lab 12/02/15 0528 12/04/15 1529 12/05/15 1155 12/07/15 2222  AST  --   --   --  15  ALT  --   --   --  7*  ALKPHOS  --   --   --  131*  BILITOT  --   --   --  0.8  PROT  --   --   --  7.5  ALBUMIN 2.5* 2.7* 2.7* 3.0*    Recent Labs Lab 12/07/15 2222  LIPASE 154*   No results for input(s): AMMONIA in the last 168 hours. CBC:  Recent Labs Lab 12/01/15 0429 12/01/15 1300 12/02/15 0528 12/03/15 0631 12/04/15 1530 12/05/15 1155 12/07/15 2222  WBC 5.0 6.2 6.1 7.3 7.9 8.9 10.5  NEUTROABS 3.3 4.4 4.0  --   --   --  8.7*  HGB 11.3* 9.2* 9.0* 9.9* 9.4* 9.4* 10.2*  HCT 36.2 28.5* 28.7* 31.0* 30.0* 29.9* 32.3*  MCV 86.0 86.6 87.2 88.3 89.8 89.0 89.0  PLT 94* 187 205 222 227 219 268   Cardiac Enzymes: No results for input(s): CKTOTAL, CKMB, CKMBINDEX, TROPONINI in the last 168 hours.  BNP (last 3 results)  Recent Labs  11/13/15 1500  BNP 315.0*    ProBNP (last 3 results) No results for input(s): PROBNP in the last 8760 hours.  CBG:  Recent Labs Lab 12/04/15 1826 12/04/15 2023 12/05/15 0737 12/05/15 1113 12/05/15 1639  GLUCAP 142* 157* 198* 213* 112*    Radiological Exams on Admission: Ct Abdomen Pelvis Wo Contrast  12/08/2015  CLINICAL DATA:  49 year old female with epigastric pain. Elevated lipase. EXAM: CT ABDOMEN AND PELVIS WITHOUT CONTRAST TECHNIQUE: Multidetector CT imaging of the abdomen and pelvis was performed following the standard protocol without IV contrast. COMPARISON:  CT dated 12/02/2014 FINDINGS: Evaluation of this exam is limited in the absence of intravenous contrast. Evaluation is also limited due to patient's body habitus. Partially visualized small left pleural effusion. Bibasilar linear atelectasis/scarring. No intra-abdominal free air. No free fluid. The liver appears grossly unremarkable.  The gallbladder is mildly distended. No calcified stone identified. Ultrasound is recommended for better evaluation of the gallbladder. The pancreas, spleen, and adrenal glands appear unremarkable. There is no hydronephrosis or nephrolithiasis. The urinary bladder is partially distended and appears grossly unremarkable. Hysterectomy. Sigmoid diverticulosis without active inflammation. There is moderate stool throughout the colon. No evidence of bowel obstruction. The appendix is not visualized with certainty. No inflammatory changes identified in the right lower quadrant. The abdominal aorta and IVC appear grossly unremarkable. No portal venous gas identified. Stable appearing top-normal retroperitoneal lymph nodes. Midline vertical anterior pelvic wall incisional scar. There is diffuse subcutaneous soft tissue stranding predominantly involving the anterior pelvic wall. No drainable fluid collection. Degenerative changes of the spine. No acute fractures. IMPRESSION: Mildly distended gallbladder. Ultrasound is recommended for further evaluation. Constipation.  No evidence of bowel obstruction or inflammation. Sigmoid diverticulosis. No hydronephrosis or nephrolithiasis. Diffuse subcutaneous soft tissue stranding and edema predominantly involving the anterior abdominal wall. No drainable fluid collection/abscess. Electronically Signed   By: Anner Crete M.D.   On: 12/08/2015 03:15       Assessment/Plan Active Problems:   Lymphedema of lower extremity   Diabetes mellitus (HCC)   HTN (hypertension)   Abdominal pain  Abdominal pain Patient presented with abdominal pain, mild elevation of lipase. CT scan showing mildly distended gallbladder. Will admit the patient under observation Obtain abdominal ultrasound to rule out cholecystitis Morphine when necessary for pain. Repeat lipase in a.m.  Diabetes mellitus Continue NPH 60 units twice a day *Sliding-scale insulin with NovoLog.  End-stage renal  disease Patient on dialysis Tuesdays Thursdays and Saturday [facility] consult nephrology in a.m.  ? Blood in vomitus Patient says that she had first episode of vomiting which was bloody, hemoglobin has been stable 10.2 previous hemoglobin on 12/05/2015 was 9.4. We'll check H&H every 6 hours.  Code status: Full code  Family discussion: Admission, patients condition and plan of care including tests being ordered have been discussed with the patient and  who indicate understanding and agree with the plan and Code Status.   Time Spent on Admission: 55 min  [place] [facility] Pager: [phone] 12/08/2015, 4:23 AM  If 7PM-7AM, please contact night-coverage  [email or web address]  Password [email or web address]

## 2015-12-08 NOTE — Progress Notes (Addendum)
Inpatient Diabetes Program Recommendations  AACE/ADA: New Consensus Statement on Inpatient Glycemic Control (2015)  Target Ranges:  Prepandial:   less than 140 mg/dL      Peak postprandial:   less than 180 mg/dL (1-2 hours)      Critically ill patients:  140 - 180 mg/dL  Results for [name], [name] (MRN [id number]) as of 12/08/2015 08:58  Ref. Range 12/05/2015 16:39 12/08/2015 07:31  Glucose-Capillary Latest Ref Range: 65-99 mg/dL 112 (H) 175 (H)   Review of Glycemic Control  Diabetes history: DM2 Outpatient Diabetes medications: 70/30 60 units BID Current orders for Inpatient glycemic control: 70/30 60 units BID, Novolog 0-9 units TID with meals  Inpatient Diabetes Program Recommendations: Insulin - Basal: Ordered 70/30 60 units BID. Please consider discontinuing 70/30 at this time and use Novolog correction for glycemic control.  Correction (SSI): Please consider changing CBGs and Novolog correction to Q4H.  Note: Patient is currently NPO and fasting glucose is 175 mg/dl this morning. Noted in reviewing the chart that patient was recently in the hospital and only Novolog correction was used for inpatient glycemic control and glucose was fairly controlled with only Novolog correction. Recommend discontinuing 70/30 and use only Novolog correction Q4H at this time. Discussed recommendations with Corene Cornea, RN.  Thanks, Barnie Alderman, RN, MSN, CDE Diabetes Coordinator Inpatient Diabetes Program [phone] (Team Pager from [place] to [place]) [phone] ([facility] office) [phone] [facility] office) [phone] [facility] office)

## 2015-12-08 NOTE — ED Provider Notes (Signed)
TIME SEEN: 12:55 AM  CHIEF COMPLAINT: Abdominal pain, vomiting  HPI: Pt is a 49 y.o. female with history of morbid obesity, hypertension, hyperlipidemia, diabetes, obesity hypoventilation syndrome, COPD, end-stage renal disease on hemodialysis Tuesdays, Thursdays and Saturdays was last dialyzed on Friday 12/16 who presents to the emergency department complaints of epigastric and left lower quadrant sharp abdominal pain that started at 6 PM. Also has had several episodes of vomiting. Nursing notes state that she saw blood in her vomit but she denies this as well as her husband. No diarrhea. Had a normal bowel movement before the symptoms started without blood or melena. No fever. No history of heavy NSAID use, alcohol use. Has never had similar symptoms. Denies history of abdominal surgery. States she's been told she has problems with her gallbladder but on imaging in 2014 and 2015 she had no gallstones. Never had pancreatitis. She denies any chest pain or shortness of breath.  ROS: See HPI Constitutional: no fever  Eyes: no drainage  ENT: no runny nose   Cardiovascular:  no chest pain  Resp: no SOB  GI:  vomiting GU: no dysuria Integumentary: no rash  Allergy: no hives  Musculoskeletal: no leg swelling  Neurological: no slurred speech ROS otherwise negative  PAST MEDICAL HISTORY/PAST SURGICAL HISTORY:  Past Medical History  Diagnosis Date  . Diabetes mellitus   . Chronic pain   . Lymph edema   . Cellulitis   . Chiari malformation     s/p surgery  . S/P colonoscopy 05/26/2007    Dr. Laural Golden sigmoid diverticulosis random biopsies benign  . CAD (coronary artery disease)   . Hypercholesterolemia   . Hypertension   . COPD (chronic obstructive pulmonary disease) ([place])   . S/P endoscopy 05/01/2009    Dr. Penelope Coop pill-induced esophageal ulcerations distal to midesophagus, 2 small ulcers in the antrum of the stomach  . Fibromyalgia   . On home O2   . Obesity hypoventilation syndrome ([place])    . Hx of echocardiogram 10/2011    EF 55-60%    MEDICATIONS:  Prior to Admission medications   Medication Sig Start Date End Date Taking? Authorizing Provider  albuterol (PROVENTIL) (2.5 MG/3ML) 0.083% nebulizer solution Take 2.5 mg by nebulization every 6 (six) hours as needed for wheezing or shortness of breath.   Yes Historical Provider, MD  ALPRAZolam Duanne Moron) 0.5 MG tablet Take 0.5 mg by mouth 3 (three) times daily as needed for anxiety or sleep.    Yes Historical Provider, MD  amLODipine (NORVASC) 10 MG tablet Take 10 mg by mouth daily.   Yes Historical Provider, MD  insulin NPH-insulin regular (NOVOLIN 70/30) (70-30) 100 UNIT/ML injection Inject 60 Units into the skin 2 (two) times daily.   Yes Historical Provider, MD  metoprolol (LOPRESSOR) 50 MG tablet Take 0.5 tablets (25 mg total) by mouth 2 (two) times daily. 12/04/15  Yes [name], MD  Oxycodone HCl 10 MG TABS Take 10 mg by mouth 4 (four) times daily as needed (pain).    Yes Historical Provider, MD  acetaminophen (TYLENOL) 325 MG tablet Take 650 mg by mouth every 6 (six) hours as needed. fever    Historical Provider, MD  diphenhydrAMINE (BENADRYL) 25 mg capsule Take 25 mg by mouth every 6 (six) hours as needed for allergies.    Historical Provider, MD    ALLERGIES:  Allergies  Allergen Reactions  . Contrast Media [Iodinated Diagnostic Agents] Anaphylaxis, Hives and Swelling    Dye for cardiac cath. Tongue swells  .  Pineapple Anaphylaxis, Itching and Swelling  . Vancomycin Nausea And Vomiting    Infusion "made me feel like I was dying" had to be readmitted to hospital  . Pneumococcal Vaccines Swelling    Turns skin black, and bodily swelling  . Shellfish Allergy Itching and Swelling    Facial swelling     SOCIAL HISTORY:  Social History  Substance Use Topics  . Smoking status: Never Smoker   . Smokeless tobacco: Never Used  . Alcohol Use: No    FAMILY HISTORY: Family History  Problem Relation Age of Onset   . Colon cancer Mother 89  . Stroke Mother 35  . Coronary artery disease Mother     EXAM: BP 159/84 mmHg  Pulse 62  Temp(Src) 97.6 F (36.4 C) (Oral)  Resp 20  Ht _0  (1.651 m)  Wt 412 lb (186.882 kg)  BMI 68.56 kg/m2  SpO2 100% CONSTITUTIONAL: Alert and oriented and responds appropriately to questions. Chronically ill-appearing, morbidly obese HEAD: Normocephalic EYES: Conjunctivae clear, PERRL ENT: normal nose; no rhinorrhea; moist mucous membranes; pharynx without lesions noted NECK: Supple, no meningismus, no LAD  CARD: RRR; S1 and S2 appreciated; no murmurs, no clicks, no rubs, no gallops RESP: Normal chest excursion without splinting or tachypnea; breath sounds clear and equal bilaterally; no wheezes, no rhonchi, no rales, no hypoxia or respiratory distress, speaking full sentences CHEST:  Patient has a dialysis catheter in the right chest without induration, fluctuance, erythema, warmth or tenderness; port in the left chest wall with no surrounding erythema, warmth, fluctuance or induration or tenderness ABD/GI: Normal bowel sounds; non-distended; soft, tender in the epigastric region with voluntary guarding, no rebound, no peritoneal signs, tender in the left lower quadrant without guarding, negative Murphy sign, no tenderness at McBurney's point BACK:  The back appears normal and is non-tender to palpation, there is no CVA tenderness EXT: Normal ROM in all joints; non-tender to palpation; no edema; normal capillary refill; no cyanosis, no calf tenderness or swelling    SKIN: Normal color for age and race; warm NEURO: Moves all extremities equally, sensation to light touch intact diffusely, cranial nerves II through XII intact PSYCH: The patient's mood and manner are appropriate. Grooming and personal hygiene are appropriate.  MEDICAL DECISION MAKING: Patient here with complaints of epigastric pain, left lower quadrant pain. Labs show elevated lipase. She has incisional  disease on hemodialysis. Potassium is 4.0. No leukocytosis but does have predominant neutrophils. Given she is morbidly obese my exam is severely limited. She has no history of pancreatitis and has never had any gallstones seen on previous imaging. I do feel she will need a CT scan today for further evaluation other suspect her pain may be secondary to pancreatitis given her lipase and location of pain. We'll give IV morphine, Zofran. She is getting IV fluids at [facility] to keep her port open. No sign of volume overload on exam.  ED PROGRESS: 3:50 AM  Pt has not had any further vomiting since receiving IV Zofran. Pain is returning. We'll give a second dose of morphine. CT scan shows a mildly distended gallbladder with no calcified stones. Her pancreas appears normal. There is also diffuse soft tissue stranding throughout the abdominal wall but no cellulitis on exam and there is no fluid collection suggestive of an abscess. She has no leukocytosis or fever. I doubt that she has cholecystitis. It is unlikely that this is gallstone pancreatitis given she has otherwise normal LFTs. Have offered patient the option of going  home and coming back in the morning for an ultrasound that she feels uncomfortable with this plan. Discussed with Dr. Darrick Meigs who is the hospitalist on call and he agrees to admit patient for observation for pain control and to have an ultrasound performed in the morning. Patient is comfortable with this plan. Will admit to medical bed, observation.    [place], DO 12/08/15 [phone]

## 2015-12-09 DIAGNOSIS — D638 Anemia in other chronic diseases classified elsewhere: Secondary | ICD-10-CM | POA: Diagnosis present

## 2015-12-09 DIAGNOSIS — Z9981 Dependence on supplemental oxygen: Secondary | ICD-10-CM | POA: Diagnosis not present

## 2015-12-09 DIAGNOSIS — Z6841 Body Mass Index (BMI) 40.0 and over, adult: Secondary | ICD-10-CM | POA: Diagnosis not present

## 2015-12-09 DIAGNOSIS — Z800 Family history of malignant neoplasm of digestive organs: Secondary | ICD-10-CM | POA: Diagnosis not present

## 2015-12-09 DIAGNOSIS — I2510 Atherosclerotic heart disease of native coronary artery without angina pectoris: Secondary | ICD-10-CM | POA: Diagnosis present

## 2015-12-09 DIAGNOSIS — J9611 Chronic respiratory failure with hypoxia: Secondary | ICD-10-CM | POA: Diagnosis present

## 2015-12-09 DIAGNOSIS — I120 Hypertensive chronic kidney disease with stage 5 chronic kidney disease or end stage renal disease: Secondary | ICD-10-CM | POA: Diagnosis present

## 2015-12-09 DIAGNOSIS — E1122 Type 2 diabetes mellitus with diabetic chronic kidney disease: Secondary | ICD-10-CM | POA: Diagnosis present

## 2015-12-09 DIAGNOSIS — Z8249 Family history of ischemic heart disease and other diseases of the circulatory system: Secondary | ICD-10-CM | POA: Diagnosis not present

## 2015-12-09 DIAGNOSIS — Z992 Dependence on renal dialysis: Secondary | ICD-10-CM | POA: Diagnosis not present

## 2015-12-09 DIAGNOSIS — E785 Hyperlipidemia, unspecified: Secondary | ICD-10-CM | POA: Diagnosis present

## 2015-12-09 DIAGNOSIS — K811 Chronic cholecystitis: Secondary | ICD-10-CM | POA: Diagnosis present

## 2015-12-09 DIAGNOSIS — I10 Essential (primary) hypertension: Secondary | ICD-10-CM | POA: Diagnosis not present

## 2015-12-09 DIAGNOSIS — N186 End stage renal disease: Secondary | ICD-10-CM | POA: Diagnosis not present

## 2015-12-09 DIAGNOSIS — Z823 Family history of stroke: Secondary | ICD-10-CM | POA: Diagnosis not present

## 2015-12-09 DIAGNOSIS — E7800 Pure hypercholesterolemia, unspecified: Secondary | ICD-10-CM | POA: Diagnosis present

## 2015-12-09 DIAGNOSIS — M797 Fibromyalgia: Secondary | ICD-10-CM | POA: Diagnosis present

## 2015-12-09 DIAGNOSIS — E871 Hypo-osmolality and hyponatremia: Secondary | ICD-10-CM | POA: Diagnosis present

## 2015-12-09 DIAGNOSIS — J449 Chronic obstructive pulmonary disease, unspecified: Secondary | ICD-10-CM | POA: Diagnosis present

## 2015-12-09 DIAGNOSIS — Z794 Long term (current) use of insulin: Secondary | ICD-10-CM | POA: Diagnosis not present

## 2015-12-09 DIAGNOSIS — E662 Morbid (severe) obesity with alveolar hypoventilation: Secondary | ICD-10-CM | POA: Diagnosis not present

## 2015-12-09 DIAGNOSIS — I890 Lymphedema, not elsewhere classified: Secondary | ICD-10-CM | POA: Diagnosis present

## 2015-12-09 DIAGNOSIS — R1032 Left lower quadrant pain: Secondary | ICD-10-CM | POA: Diagnosis present

## 2015-12-09 LAB — CBC
HCT: 31.3 % — ABNORMAL LOW (ref 36.0–46.0)
Hemoglobin: 9.9 g/dL — ABNORMAL LOW (ref 12.0–15.0)
MCH: 27.7 pg (ref 26.0–34.0)
MCHC: 31.6 g/dL (ref 30.0–36.0)
MCV: 87.7 fL (ref 78.0–100.0)
PLATELETS: 253 10*3/uL (ref 150–400)
RBC: 3.57 MIL/uL — ABNORMAL LOW (ref 3.87–5.11)
RDW: 17.3 % — AB (ref 11.5–15.5)
WBC: 25.9 10*3/uL — ABNORMAL HIGH (ref 4.0–10.5)

## 2015-12-09 LAB — HEMOGLOBIN A1C
Hgb A1c MFr Bld: 8.1 % — ABNORMAL HIGH (ref 4.8–5.6)
MEAN PLASMA GLUCOSE: 186 mg/dL

## 2015-12-09 LAB — GLUCOSE, CAPILLARY
GLUCOSE-CAPILLARY: 160 mg/dL — AB (ref 65–99)
Glucose-Capillary: 165 mg/dL — ABNORMAL HIGH (ref 65–99)
Glucose-Capillary: 124 mg/dL — ABNORMAL HIGH (ref 65–99)
Glucose-Capillary: 130 mg/dL — ABNORMAL HIGH (ref 65–99)

## 2015-12-09 LAB — BASIC METABOLIC PANEL
Anion gap: 10 (ref 5–15)
BUN: 41 mg/dL — AB (ref 6–20)
CALCIUM: 8.6 mg/dL — AB (ref 8.9–10.3)
CO2: 28 mmol/L (ref 22–32)
CREATININE: 4.75 mg/dL — AB (ref 0.44–1.00)
Chloride: 98 mmol/L — ABNORMAL LOW (ref 101–111)
GFR calc Af Amer: 11 mL/min — ABNORMAL LOW (ref >60)
GFR, EST NON AFRICAN AMERICAN: 10 mL/min — AB (ref >60)
GLUCOSE: 187 mg/dL — AB (ref 65–99)
Potassium: 4.8 mmol/L (ref 3.5–5.1)
SODIUM: 136 mmol/L (ref 135–145)

## 2015-12-09 LAB — HEMOGLOBIN AND HEMATOCRIT, BLOOD
HCT: 30.9 % — ABNORMAL LOW (ref 36.0–46.0)
HCT: 29.2 % — ABNORMAL LOW (ref 36.0–46.0)
HEMOGLOBIN: 9.5 g/dL — AB (ref 12.0–15.0)
Hemoglobin: 9.9 g/dL — ABNORMAL LOW (ref 12.0–15.0)

## 2015-12-09 LAB — PHOSPHORUS: Phosphorus: 5 mg/dL — ABNORMAL HIGH (ref 2.5–4.6)

## 2015-12-09 MED ORDER — ALTEPLASE 2 MG IJ SOLR: 2.0000 mg | Freq: Once | INTRAMUSCULAR | Status: DC | PRN

## 2015-12-09 MED ORDER — SODIUM CHLORIDE 0.9 % IV SOLN: 100.0000 mL | INTRAVENOUS | Status: DC | PRN

## 2015-12-09 MED ORDER — EPOETIN ALFA 10000 UNIT/ML IJ SOLN
INTRAMUSCULAR | Status: AC
  Administered 2015-12-09: 10000 [IU] via INTRAVENOUS
  Filled 2015-12-09: qty 1

## 2015-12-09 MED ORDER — ONDANSETRON HCL 4 MG/2ML IJ SOLN
4.0000 mg | Freq: Four times a day (QID) | INTRAMUSCULAR | Status: DC | PRN
  Administered 2015-12-09: 4 mg via INTRAVENOUS
  Filled 2015-12-09: qty 2

## 2015-12-09 MED ORDER — HEPARIN SODIUM (PORCINE) 1000 UNIT/ML DIALYSIS
1000.0000 [IU] | INTRAMUSCULAR | Status: DC | PRN
  Administered 2015-12-09: 3800 [IU] via INTRAVENOUS_CENTRAL

## 2015-12-09 MED ORDER — HEPARIN SODIUM (PORCINE) 1000 UNIT/ML IJ SOLN
INTRAMUSCULAR | Status: AC
  Administered 2015-12-09: 3800 [IU] via INTRAVENOUS_CENTRAL
  Filled 2015-12-09: qty 7

## 2015-12-09 MED ORDER — EPOETIN ALFA 10000 UNIT/ML IJ SOLN
10000.0000 [IU] | INTRAMUSCULAR | Status: DC
  Administered 2015-12-09: 10000 [IU] via INTRAVENOUS

## 2015-12-09 MED ORDER — SODIUM CHLORIDE 0.9 % IJ SOLN: 10.0000 mL | INTRAMUSCULAR | Status: DC | PRN

## 2015-12-09 NOTE — Progress Notes (Signed)
Subjective: She feels better. She has no nausea now. Her abdominal pain is better. However her white blood count is 25,000 this morning. Her ultrasound showed what appeared to be chronic cholecystitis  Objective: Vital signs in last 24 hours: Temp:  [98.4 F (36.9 C)-100.1 F (37.8 C)] 98.4 F (36.9 C) (12/20 0606) Pulse Rate:  [71-81] 77 (12/20 0606) Resp:  [18] 18 (12/20 0606) BP: (133-153)/(71-84) 133/84 mmHg (12/20 0606) SpO2:  [92 %-95 %] 94 % (12/20 0606) Weight change:  Last BM Date: 12/08/15  Intake/Output from previous day: 12/19 0701 - 12/20 0700 In: 110 [I.V.:110] Out: 201 [Urine:201]  PHYSICAL EXAM General appearance: alert, cooperative and no distress Resp: clear to auscultation bilaterally Cardio: regular rate and rhythm, S1, S2 normal, no murmur, click, rub or gallop GI: soft, non-tender; bowel sounds normal; no masses,  no organomegaly Extremities: Chronic lymphedema  Lab Results:  Results for orders placed or performed during the hospital encounter of 12/07/15 (from the past 48 hour(s))  Comprehensive metabolic panel     Status: Abnormal   Collection Time: 12/07/15 10:22 PM  Result Value Ref Range   Sodium 137 135 - 145 mmol/L   Potassium 4.0 3.5 - 5.1 mmol/L   Chloride 99 (L) 101 - 111 mmol/L   CO2 29 22 - 32 mmol/L   Glucose, Bld 199 (H) 65 - 99 mg/dL   BUN 32 (H) 6 - 20 mg/dL   Creatinine, Ser 4.37 (H) 0.44 - 1.00 mg/dL   Calcium 8.7 (L) 8.9 - 10.3 mg/dL   Total Protein 7.5 6.5 - 8.1 g/dL   Albumin 3.0 (L) 3.5 - 5.0 g/dL   AST 15 15 - 41 U/L   ALT 7 (L) 14 - 54 U/L   Alkaline Phosphatase 131 (H) 38 - 126 U/L   Total Bilirubin 0.8 0.3 - 1.2 mg/dL   GFR calc non Af Amer 11 (L) >60 mL/min   GFR calc Af Amer 13 (L) >60 mL/min    Comment: (NOTE) The eGFR has been calculated using the CKD EPI equation. This calculation has not been validated in all clinical situations. eGFR's persistently <60 mL/min signify possible Chronic Kidney Disease.    Anion gap 9 5 - 15  Lipase, blood     Status: Abnormal   Collection Time: 12/07/15 10:22 PM  Result Value Ref Range   Lipase 154 (H) 11 - 51 U/L  CBC with Differential     Status: Abnormal   Collection Time: 12/07/15 10:22 PM  Result Value Ref Range   WBC 10.5 4.0 - 10.5 K/uL   RBC 3.63 (L) 3.87 - 5.11 MIL/uL   Hemoglobin 10.2 (L) 12.0 - 15.0 g/dL   HCT 32.3 (L) 36.0 - 46.0 %   MCV 89.0 78.0 - 100.0 fL   MCH 28.1 26.0 - 34.0 pg   MCHC 31.6 30.0 - 36.0 g/dL   RDW 17.8 (H) 11.5 - 15.5 %   Platelets 268 150 - 400 K/uL   Neutrophils Relative % 83 %   Neutro Abs 8.7 (H) 1.7 - 7.7 K/uL   Lymphocytes Relative 11 %   Lymphs Abs 1.1 0.7 - 4.0 K/uL   Monocytes Relative 5 %   Monocytes Absolute 0.6 0.1 - 1.0 K/uL   Eosinophils Relative 1 %   Eosinophils Absolute 0.1 0.0 - 0.7 K/uL   Basophils Relative 0 %   Basophils Absolute 0.0 0.0 - 0.1 K/uL  Hemoglobin A1c     Status: Abnormal   Collection Time: 12/08/15  5:45 AM  Result Value Ref Range   Hgb A1c MFr Bld 8.1 (H) 4.8 - 5.6 %    Comment: (NOTE)         Pre-diabetes: 5.7 - 6.4         Diabetes: >6.4         Glycemic control for adults with diabetes: <7.0    Mean Plasma Glucose 186 mg/dL    Comment: (NOTE) Performed At: [facility] [place], [place] [id number] Lindon Romp MD [id number]   CBC     Status: Abnormal   Collection Time: 12/08/15  5:45 AM  Result Value Ref Range   WBC 12.8 (H) 4.0 - 10.5 K/uL   RBC 3.63 (L) 3.87 - 5.11 MIL/uL   Hemoglobin 10.1 (L) 12.0 - 15.0 g/dL   HCT 32.0 (L) 36.0 - 46.0 %   MCV 88.2 78.0 - 100.0 fL   MCH 27.8 26.0 - 34.0 pg   MCHC 31.6 30.0 - 36.0 g/dL   RDW 17.5 (H) 11.5 - 15.5 %   Platelets 271 150 - 400 K/uL  Creatinine, serum     Status: Abnormal   Collection Time: 12/08/15  5:45 AM  Result Value Ref Range   Creatinine, Ser 4.25 (H) 0.44 - 1.00 mg/dL   GFR calc non Af Amer 11 (L) >60 mL/min   GFR calc Af Amer 13 (L) >60 mL/min    Comment: (NOTE) The  eGFR has been calculated using the CKD EPI equation. This calculation has not been validated in all clinical situations. eGFR's persistently <60 mL/min signify possible Chronic Kidney Disease.   Lipase, blood     Status: Abnormal   Collection Time: 12/08/15  5:45 AM  Result Value Ref Range   Lipase 98 (H) 11 - 51 U/L  Glucose, capillary     Status: Abnormal   Collection Time: 12/08/15  7:31 AM  Result Value Ref Range   Glucose-Capillary 175 (H) 65 - 99 mg/dL  Glucose, capillary     Status: Abnormal   Collection Time: 12/08/15 11:42 AM  Result Value Ref Range   Glucose-Capillary 192 (H) 65 - 99 mg/dL  Hemoglobin and hematocrit, blood     Status: Abnormal   Collection Time: 12/08/15  1:23 PM  Result Value Ref Range   Hemoglobin 10.5 (L) 12.0 - 15.0 g/dL   HCT 33.1 (L) 36.0 - 46.0 %  Glucose, capillary     Status: Abnormal   Collection Time: 12/08/15  4:12 PM  Result Value Ref Range   Glucose-Capillary 178 (H) 65 - 99 mg/dL   Comment 1 Notify RN    Comment 2 Document in Chart   Hemoglobin and hematocrit, blood     Status: Abnormal   Collection Time: 12/08/15  8:54 PM  Result Value Ref Range   Hemoglobin 10.9 (L) 12.0 - 15.0 g/dL   HCT 33.8 (L) 36.0 - 46.0 %  Glucose, capillary     Status: Abnormal   Collection Time: 12/08/15  9:08 PM  Result Value Ref Range   Glucose-Capillary 160 (H) 65 - 99 mg/dL  CBC     Status: Abnormal   Collection Time: 12/09/15  6:02 AM  Result Value Ref Range   WBC 25.9 (H) 4.0 - 10.5 K/uL   RBC 3.57 (L) 3.87 - 5.11 MIL/uL   Hemoglobin 9.9 (L) 12.0 - 15.0 g/dL   HCT 31.3 (L) 36.0 - 46.0 %   MCV 87.7 78.0 - 100.0 fL   MCH 27.7  26.0 - 34.0 pg   MCHC 31.6 30.0 - 36.0 g/dL   RDW 17.3 (H) 11.5 - 15.5 %   Platelets 253 150 - 400 K/uL  Basic metabolic panel     Status: Abnormal   Collection Time: 12/09/15  6:02 AM  Result Value Ref Range   Sodium 136 135 - 145 mmol/L   Potassium 4.8 3.5 - 5.1 mmol/L   Chloride 98 (L) 101 - 111 mmol/L   CO2 28 22 -  32 mmol/L   Glucose, Bld 187 (H) 65 - 99 mg/dL   BUN 41 (H) 6 - 20 mg/dL   Creatinine, Ser 4.75 (H) 0.44 - 1.00 mg/dL   Calcium 8.6 (L) 8.9 - 10.3 mg/dL   GFR calc non Af Amer 10 (L) >60 mL/min   GFR calc Af Amer 11 (L) >60 mL/min    Comment: (NOTE) The eGFR has been calculated using the CKD EPI equation. This calculation has not been validated in all clinical situations. eGFR's persistently <60 mL/min signify possible Chronic Kidney Disease.    Anion gap 10 5 - 15  Phosphorus     Status: Abnormal   Collection Time: 12/09/15  6:02 AM  Result Value Ref Range   Phosphorus 5.0 (H) 2.5 - 4.6 mg/dL  Glucose, capillary     Status: Abnormal   Collection Time: 12/09/15  7:24 AM  Result Value Ref Range   Glucose-Capillary 165 (H) 65 - 99 mg/dL   Comment 1 Notify RN    Comment 2 Document in Chart     ABGS No results for input(s): PHART, PO2ART, TCO2, HCO3 in the last 72 hours.  Invalid input(s): PCO2 CULTURES No results found for this or any previous visit (from the past 240 hour(s)). Studies/Results: Ct Abdomen Pelvis Wo Contrast  12/08/2015  CLINICAL DATA:  49 year old female with epigastric pain. Elevated lipase. EXAM: CT ABDOMEN AND PELVIS WITHOUT CONTRAST TECHNIQUE: Multidetector CT imaging of the abdomen and pelvis was performed following the standard protocol without IV contrast. COMPARISON:  CT dated 12/02/2014 FINDINGS: Evaluation of this exam is limited in the absence of intravenous contrast. Evaluation is also limited due to patient's body habitus. Partially visualized small left pleural effusion. Bibasilar linear atelectasis/scarring. No intra-abdominal free air. No free fluid. The liver appears grossly unremarkable. The gallbladder is mildly distended. No calcified stone identified. Ultrasound is recommended for better evaluation of the gallbladder. The pancreas, spleen, and adrenal glands appear unremarkable. There is no hydronephrosis or nephrolithiasis. The urinary bladder is  partially distended and appears grossly unremarkable. Hysterectomy. Sigmoid diverticulosis without active inflammation. There is moderate stool throughout the colon. No evidence of bowel obstruction. The appendix is not visualized with certainty. No inflammatory changes identified in the right lower quadrant. The abdominal aorta and IVC appear grossly unremarkable. No portal venous gas identified. Stable appearing top-normal retroperitoneal lymph nodes. Midline vertical anterior pelvic wall incisional scar. There is diffuse subcutaneous soft tissue stranding predominantly involving the anterior pelvic wall. No drainable fluid collection. Degenerative changes of the spine. No acute fractures. IMPRESSION: Mildly distended gallbladder. Ultrasound is recommended for further evaluation. Constipation.  No evidence of bowel obstruction or inflammation. Sigmoid diverticulosis. No hydronephrosis or nephrolithiasis. Diffuse subcutaneous soft tissue stranding and edema predominantly involving the anterior abdominal wall. No drainable fluid collection/abscess. Electronically Signed   By: Anner Crete M.D.   On: 12/08/2015 03:15   US Abdomen Limited Ruq  12/08/2015  CLINICAL DATA:  Abdominal pain and nausea for the past 3 days, distended gallbladder on  abdominal CT scan today. EXAM: US ABDOMEN LIMITED - RIGHT UPPER QUADRANT COMPARISON:  CT scan of the abdomen dated December 08, 2015 FINDINGS: Gallbladder: The gallbladder is mildly distended. There is wall thickening to 3.6 mm. Sludge is present but no stones are evident. There is a small amount of pericholecystic fluid. There is no positive sonographic Murphy's sign. Common bile duct: Diameter: 7.3-7.9 mm.  No intraluminal echoes are observed. Liver: The hepatic echotexture is mildly increased. There is no focal mass or ductal dilation demonstrated on this ultrasound. IMPRESSION: 1. Gallbladder distention, minimal wall thickening, sludge, and pericholecystic fluid vs  trace ascites. This could reflect acute or chronic cholecystitis. The lack of a positive sonographic Murphy's sign favors chronic cholecystitis. 2. Increased hepatic echotexture is consistent with fatty infiltrative change. Electronically Signed   By: David  Martinique M.D.   On: 12/08/2015 09:49    Medications:  Prior to Admission:  Prescriptions prior to admission  Medication Sig Dispense Refill Last Dose  . albuterol (PROVENTIL) (2.5 MG/3ML) 0.083% nebulizer solution Take 2.5 mg by nebulization every 6 (six) hours as needed for wheezing or shortness of breath.   11/09/2015  . ALPRAZolam (XANAX) 0.5 MG tablet Take 0.5 mg by mouth 3 (three) times daily as needed for anxiety or sleep.    Past Month  . amLODipine (NORVASC) 10 MG tablet Take 10 mg by mouth daily.   12/06/2015  . insulin NPH-insulin regular (NOVOLIN 70/30) (70-30) 100 UNIT/ML injection Inject 60 Units into the skin 2 (two) times daily.   12/04/2015  . metoprolol (LOPRESSOR) 50 MG tablet Take 0.5 tablets (25 mg total) by mouth 2 (two) times daily. 30 tablet 12 12/06/2015 at 1800  . Oxycodone HCl 10 MG TABS Take 10 mg by mouth 4 (four) times daily as needed (pain).    12/07/2015  . acetaminophen (TYLENOL) 325 MG tablet Take 650 mg by mouth every 6 (six) hours as needed. fever   unknown  . diphenhydrAMINE (BENADRYL) 25 mg capsule Take 25 mg by mouth every 6 (six) hours as needed for allergies.   More Than A Month   Scheduled: . amLODipine  10 mg Oral Daily  . antiseptic oral rinse  7 mL Mouth Rinse BID  . insulin aspart  0-9 Units Subcutaneous TID WC  . metoprolol  25 mg Oral BID  . piperacillin-tazobactam (ZOSYN)  IV  2.25 g Intravenous Q8H   Continuous: . sodium chloride 10 mL/hr at 12/08/15 0956   BLT:[id number], ALPRAZolam, diphenhydrAMINE, morphine injection, ondansetron (ZOFRAN) IV, oxyCODONE  Assesment: She was admitted with chronic cholecystitis. She is better. Her white blood count is still very elevated however. She has  end-stage chronic renal disease on dialysis which is stable. She has chronic hypoxic respiratory failure on BiPAP at night at home. She is to do dialysis today. Active Problems:   Lymphedema of lower extremity   Diabetes mellitus (HCC)   HTN (hypertension)   Abdominal pain    Plan: I think she can probably do antibiotics as an outpatient perhaps even by mouth. I like to see her white blood count better at least before she is discharged      Nethra Mehlberg L 12/09/2015, 8:54 AM

## 2015-12-09 NOTE — Progress Notes (Signed)
Subjective: Patient is feeling much better. Her abdominal pain has improved and she doesn't have any nausea or vomiting.   Objective: Vital signs in last 24 hours: Temp:  [98.4 F (36.9 C)-100.1 F (37.8 C)] 98.4 F (36.9 C) (12/20 0606) Pulse Rate:  [71-81] 77 (12/20 0606) Resp:  [18] 18 (12/20 0606) BP: (133-153)/(71-84) 133/84 mmHg (12/20 0606) SpO2:  [92 %-95 %] 94 % (12/20 0606)  Intake/Output from previous day: 12/19 0701 - 12/20 0700 In: 110 [I.V.:110] Out: 201 [Urine:201] Intake/Output this shift:     Recent Labs  12/07/15 2222 12/08/15 0545 12/08/15 1323 12/08/15 2054 12/09/15 0602  HGB 10.2* 10.1* 10.5* 10.9* 9.9*    Recent Labs  12/08/15 0545  12/08/15 2054 12/09/15 0602  WBC 12.8*  --   --  25.9*  RBC 3.63*  --   --  3.57*  HCT 32.0*  < > 33.8* 31.3*  PLT 271  --   --  253  < > = values in this interval not displayed.  Recent Labs  12/07/15 2222 12/08/15 0545 12/09/15 0602  NA 137  --  136  K 4.0  --  4.8  CL 99*  --  98*  CO2 29  --  28  BUN 32*  --  41*  CREATININE 4.37* 4.25* 4.75*  GLUCOSE 199*  --  187*  CALCIUM 8.7*  --  8.6*   No results for input(s): LABPT, INR in the last 72 hours.  Generally patient is alert and in no apparent distress Chest decreased breath sound bilaterally, no wheezing Heart exam revealed regular rate and rhythm no murmur Abdomen: Slight tenderness around suprapubic area. Positive bowel sound no rebound tenderness Extremities: Patient with significant lymphedema and chronic changes.  Assessment/Plan: Problem #1 abdominal pain: To to be possibly from gallbladder disease. Patient is on antibiotics. She is a febrile however her white blood cell count is high. Blood cultures no growth. Problem #2 end-stage renal disease: She is status post hemodialysis on Friday. Her potassium is normal. Problem #3 anemia: Her hemoglobin and hematocrit is below the target goal. Presently patient is on Epogen Problem #4  metabolic bone disease: Her calcium and phosphorus is range Problem #5 diabetes Problem #6 hypertension: Her blood pressure is reasonably controlled Problem #7 sleep apnea Plan: We'll make arrangements for patient to get dialysis today We'll remove about 3 L if blood pressure tolerates We'll use Epogen 10,000 units after each dialysis.   Diyana Starrett S 12/09/2015, 7:32 AM

## 2015-12-09 NOTE — Procedures (Signed)
HEMODIALYSIS TREATMENT NOTE:  4.5 hour heparin-free dialysis completed via right IJ tunneled catheter. Exit site unremarkable. Goal exceeded (due to over-estimation of saline flushesneeded for circuit patency).  3 liter goal; 3.8 liters removed without interruption in UF and with stable BP.  All blood was returned. Report given to Rosealee Albee, RN.  Rockwell Alexandria, RN, CDN

## 2015-12-10 LAB — CBC WITH DIFFERENTIAL/PLATELET
BASOS PCT: 0 %
Basophils Absolute: 0 10*3/uL (ref 0.0–0.1)
EOS PCT: 0 %
Eosinophils Absolute: 0 10*3/uL (ref 0.0–0.7)
HEMATOCRIT: 27.9 % — AB (ref 36.0–46.0)
Hemoglobin: 9 g/dL — ABNORMAL LOW (ref 12.0–15.0)
Lymphocytes Relative: 6 %
Lymphs Abs: 1.6 10*3/uL (ref 0.7–4.0)
MCH: 28.1 pg (ref 26.0–34.0)
MCHC: 32.3 g/dL (ref 30.0–36.0)
MCV: 87.2 fL (ref 78.0–100.0)
MONO ABS: 1.8 10*3/uL — AB (ref 0.1–1.0)
MONOS PCT: 7 %
NEUTROS ABS: 20.7 10*3/uL — AB (ref 1.7–7.7)
Neutrophils Relative %: 87 %
PLATELETS: 278 10*3/uL (ref 150–400)
RBC: 3.2 MIL/uL — ABNORMAL LOW (ref 3.87–5.11)
RDW: 17.3 % — AB (ref 11.5–15.5)
WBC: 24.1 10*3/uL — ABNORMAL HIGH (ref 4.0–10.5)

## 2015-12-10 LAB — BASIC METABOLIC PANEL
ANION GAP: 8 (ref 5–15)
BUN: 30 mg/dL — AB (ref 6–20)
CALCIUM: 8.1 mg/dL — AB (ref 8.9–10.3)
CO2: 31 mmol/L (ref 22–32)
Chloride: 94 mmol/L — ABNORMAL LOW (ref 101–111)
Creatinine, Ser: 4.07 mg/dL — ABNORMAL HIGH (ref 0.44–1.00)
GFR calc Af Amer: 14 mL/min — ABNORMAL LOW (ref >60)
GFR calc non Af Amer: 12 mL/min — ABNORMAL LOW (ref >60)
GLUCOSE: 169 mg/dL — AB (ref 65–99)
Potassium: 4.1 mmol/L (ref 3.5–5.1)
Sodium: 133 mmol/L — ABNORMAL LOW (ref 135–145)

## 2015-12-10 LAB — GLUCOSE, CAPILLARY
GLUCOSE-CAPILLARY: 142 mg/dL — AB (ref 65–99)
Glucose-Capillary: 170 mg/dL — ABNORMAL HIGH (ref 65–99)

## 2015-12-10 LAB — HEMOGLOBIN AND HEMATOCRIT, BLOOD
HCT: 27.8 % — ABNORMAL LOW (ref 36.0–46.0)
Hemoglobin: 8.8 g/dL — ABNORMAL LOW (ref 12.0–15.0)

## 2015-12-10 MED ORDER — AMOXICILLIN 500 MG PO TABS: 500.0000 mg | ORAL_TABLET | Freq: Two times a day (BID) | ORAL | Status: DC

## 2015-12-10 MED ORDER — HEPARIN SOD (PORK) LOCK FLUSH 100 UNIT/ML IV SOLN
500.0000 [IU] | INTRAVENOUS | Status: AC | PRN
  Administered 2015-12-10: 500 [IU]
  Filled 2015-12-10: qty 5

## 2015-12-10 NOTE — Clinical Documentation Improvement (Signed)
Internal Medicine  Can the diagnosis of anemia be further specified? Please document findings in next progress note; NOT in BPA drop down box. Thank you!   Iron Deficiency Anemia  Nutritional anemia, including the nutrition or mineral deficits  Chronic Blood Loss Anemia, including the suspected or known cause  Anemia of chronic disease, including the associated chronic disease state  Other  Clinically Undetermined  Document any associated diagnoses/conditions.  Has ESRD; on Hemodialysis  Supporting Information:  H&H = 10/32, 9.9/31 for current admission  Please exercise your independent, professional judgment when responding. A specific answer is not anticipated or expected.  Thank You,  Zoila Shutter RN, BSN, [place] [phone]; Cell: [phone]

## 2015-12-10 NOTE — Progress Notes (Signed)
Patient BP 95/53, P in 70's.  MD paged and made aware.  No new orders.

## 2015-12-10 NOTE — Progress Notes (Signed)
Subjective: She feels well and wants to go home. She says her abdomen is okay. She's not having any nausea. No abdominal pain.  Objective: Vital signs in last 24 hours: Temp:  [98.1 F (36.7 C)-98.4 F (36.9 C)] 98.2 F (36.8 C) (12/21 0611) Pulse Rate:  [70-79] 77 (12/21 0611) Resp:  [20] 20 (12/21 0611) BP: (118-163)/(62-90) 132/72 mmHg (12/21 0611) SpO2:  [93 %-95 %] 94 % (12/21 0611) Weight:  [179.624 kg (396 lb)-189.604 kg (418 lb)] 179.624 kg (396 lb) (12/20 1715) Weight change:  Last BM Date: 12/08/15  Intake/Output from previous day: 12/20 0701 - 12/21 0700 In: 810 [P.O.:340; I.V.:370; IV Piggyback:100] Out: 3900 [Urine:100]  PHYSICAL EXAM General appearance: alert, cooperative, no distress and morbidly obese Resp: diminished breath sounds bilaterally Cardio: regular rate and rhythm, S1, S2 normal, no murmur, click, rub or gallop GI: Her abdomen is soft with no tenderness bowel sounds are present and active Extremities: She has chronic lymphedema  Lab Results:  Results for orders placed or performed during the hospital encounter of 12/07/15 (from the past 48 hour(s))  Glucose, capillary     Status: Abnormal   Collection Time: 12/08/15 11:42 AM  Result Value Ref Range   Glucose-Capillary 192 (H) 65 - 99 mg/dL  Hemoglobin and hematocrit, blood     Status: Abnormal   Collection Time: 12/08/15  1:23 PM  Result Value Ref Range   Hemoglobin 10.5 (L) 12.0 - 15.0 g/dL   HCT 33.1 (L) 36.0 - 46.0 %  Glucose, capillary     Status: Abnormal   Collection Time: 12/08/15  4:12 PM  Result Value Ref Range   Glucose-Capillary 178 (H) 65 - 99 mg/dL   Comment 1 Notify RN    Comment 2 Document in Chart   Hemoglobin and hematocrit, blood     Status: Abnormal   Collection Time: 12/08/15  8:54 PM  Result Value Ref Range   Hemoglobin 10.9 (L) 12.0 - 15.0 g/dL   HCT 33.8 (L) 36.0 - 46.0 %  Glucose, capillary     Status: Abnormal   Collection Time: 12/08/15  9:08 PM  Result Value  Ref Range   Glucose-Capillary 160 (H) 65 - 99 mg/dL  CBC     Status: Abnormal   Collection Time: 12/09/15  6:02 AM  Result Value Ref Range   WBC 25.9 (H) 4.0 - 10.5 K/uL   RBC 3.57 (L) 3.87 - 5.11 MIL/uL   Hemoglobin 9.9 (L) 12.0 - 15.0 g/dL   HCT 31.3 (L) 36.0 - 46.0 %   MCV 87.7 78.0 - 100.0 fL   MCH 27.7 26.0 - 34.0 pg   MCHC 31.6 30.0 - 36.0 g/dL   RDW 17.3 (H) 11.5 - 15.5 %   Platelets 253 150 - 400 K/uL  Basic metabolic panel     Status: Abnormal   Collection Time: 12/09/15  6:02 AM  Result Value Ref Range   Sodium 136 135 - 145 mmol/L   Potassium 4.8 3.5 - 5.1 mmol/L   Chloride 98 (L) 101 - 111 mmol/L   CO2 28 22 - 32 mmol/L   Glucose, Bld 187 (H) 65 - 99 mg/dL   BUN 41 (H) 6 - 20 mg/dL   Creatinine, Ser 4.75 (H) 0.44 - 1.00 mg/dL   Calcium 8.6 (L) 8.9 - 10.3 mg/dL   GFR calc non Af Amer 10 (L) >60 mL/min   GFR calc Af Amer 11 (L) >60 mL/min    Comment: (NOTE) The eGFR has been  calculated using the CKD EPI equation. This calculation has not been validated in all clinical situations. eGFR's persistently <60 mL/min signify possible Chronic Kidney Disease.    Anion gap 10 5 - 15  Phosphorus     Status: Abnormal   Collection Time: 12/09/15  6:02 AM  Result Value Ref Range   Phosphorus 5.0 (H) 2.5 - 4.6 mg/dL  Glucose, capillary     Status: Abnormal   Collection Time: 12/09/15  7:24 AM  Result Value Ref Range   Glucose-Capillary 165 (H) 65 - 99 mg/dL   Comment 1 Notify RN    Comment 2 Document in Chart   Glucose, capillary     Status: Abnormal   Collection Time: 12/09/15 11:22 AM  Result Value Ref Range   Glucose-Capillary 160 (H) 65 - 99 mg/dL   Comment 1 Notify RN    Comment 2 Document in Chart   Hemoglobin and hematocrit, blood     Status: Abnormal   Collection Time: 12/09/15  1:12 PM  Result Value Ref Range   Hemoglobin 9.9 (L) 12.0 - 15.0 g/dL   HCT 30.9 (L) 36.0 - 46.0 %  Glucose, capillary     Status: Abnormal   Collection Time: 12/09/15  5:48 PM   Result Value Ref Range   Glucose-Capillary 124 (H) 65 - 99 mg/dL   Comment 1 Notify RN    Comment 2 Document in Chart   Glucose, capillary     Status: Abnormal   Collection Time: 12/09/15  8:40 PM  Result Value Ref Range   Glucose-Capillary 130 (H) 65 - 99 mg/dL   Comment 1 Notify RN    Comment 2 Document in Chart   Hemoglobin and hematocrit, blood     Status: Abnormal   Collection Time: 12/09/15  9:12 PM  Result Value Ref Range   Hemoglobin 9.5 (L) 12.0 - 15.0 g/dL   HCT 29.2 (L) 36.0 - 14.9 %  Basic metabolic panel     Status: Abnormal   Collection Time: 12/10/15  5:51 AM  Result Value Ref Range   Sodium 133 (L) 135 - 145 mmol/L   Potassium 4.1 3.5 - 5.1 mmol/L   Chloride 94 (L) 101 - 111 mmol/L   CO2 31 22 - 32 mmol/L   Glucose, Bld 169 (H) 65 - 99 mg/dL   BUN 30 (H) 6 - 20 mg/dL   Creatinine, Ser 4.07 (H) 0.44 - 1.00 mg/dL   Calcium 8.1 (L) 8.9 - 10.3 mg/dL   GFR calc non Af Amer 12 (L) >60 mL/min   GFR calc Af Amer 14 (L) >60 mL/min    Comment: (NOTE) The eGFR has been calculated using the CKD EPI equation. This calculation has not been validated in all clinical situations. eGFR's persistently <60 mL/min signify possible Chronic Kidney Disease.    Anion gap 8 5 - 15  CBC with Differential/Platelet     Status: Abnormal   Collection Time: 12/10/15  5:51 AM  Result Value Ref Range   WBC 24.1 (H) 4.0 - 10.5 K/uL   RBC 3.20 (L) 3.87 - 5.11 MIL/uL   Hemoglobin 9.0 (L) 12.0 - 15.0 g/dL   HCT 27.9 (L) 36.0 - 46.0 %   MCV 87.2 78.0 - 100.0 fL   MCH 28.1 26.0 - 34.0 pg   MCHC 32.3 30.0 - 36.0 g/dL   RDW 17.3 (H) 11.5 - 15.5 %   Platelets 278 150 - 400 K/uL   Neutrophils Relative % 87 %  Neutro Abs 20.7 (H) 1.7 - 7.7 K/uL   Lymphocytes Relative 6 %   Lymphs Abs 1.6 0.7 - 4.0 K/uL   Monocytes Relative 7 %   Monocytes Absolute 1.8 (H) 0.1 - 1.0 K/uL   Eosinophils Relative 0 %   Eosinophils Absolute 0.0 0.0 - 0.7 K/uL   Basophils Relative 0 %   Basophils Absolute 0.0  0.0 - 0.1 K/uL  Glucose, capillary     Status: Abnormal   Collection Time: 12/10/15  7:49 AM  Result Value Ref Range   Glucose-Capillary 142 (H) 65 - 99 mg/dL   Comment 1 Notify RN     ABGS No results for input(s): PHART, PO2ART, TCO2, HCO3 in the last 72 hours.  Invalid input(s): PCO2 CULTURES No results found for this or any previous visit (from the past 240 hour(s)). Studies/Results: US Abdomen Limited Ruq  12/08/2015  CLINICAL DATA:  Abdominal pain and nausea for the past 3 days, distended gallbladder on abdominal CT scan today. EXAM: US ABDOMEN LIMITED - RIGHT UPPER QUADRANT COMPARISON:  CT scan of the abdomen dated December 08, 2015 FINDINGS: Gallbladder: The gallbladder is mildly distended. There is wall thickening to 3.6 mm. Sludge is present but no stones are evident. There is a small amount of pericholecystic fluid. There is no positive sonographic Murphy's sign. Common bile duct: Diameter: 7.3-7.9 mm.  No intraluminal echoes are observed. Liver: The hepatic echotexture is mildly increased. There is no focal mass or ductal dilation demonstrated on this ultrasound. IMPRESSION: 1. Gallbladder distention, minimal wall thickening, sludge, and pericholecystic fluid vs trace ascites. This could reflect acute or chronic cholecystitis. The lack of a positive sonographic Murphy's sign favors chronic cholecystitis. 2. Increased hepatic echotexture is consistent with fatty infiltrative change. Electronically Signed   By: David  Martinique M.D.   On: 12/08/2015 09:49    Medications:  Prior to Admission:  Prescriptions prior to admission  Medication Sig Dispense Refill Last Dose  . albuterol (PROVENTIL) (2.5 MG/3ML) 0.083% nebulizer solution Take 2.5 mg by nebulization every 6 (six) hours as needed for wheezing or shortness of breath.   11/09/2015  . ALPRAZolam (XANAX) 0.5 MG tablet Take 0.5 mg by mouth 3 (three) times daily as needed for anxiety or sleep.    Past Month  . amLODipine (NORVASC) 10  MG tablet Take 10 mg by mouth daily.   12/06/2015  . insulin NPH-insulin regular (NOVOLIN 70/30) (70-30) 100 UNIT/ML injection Inject 60 Units into the skin 2 (two) times daily.   12/04/2015  . metoprolol (LOPRESSOR) 50 MG tablet Take 0.5 tablets (25 mg total) by mouth 2 (two) times daily. 30 tablet 12 12/06/2015 at 1800  . Oxycodone HCl 10 MG TABS Take 10 mg by mouth 4 (four) times daily as needed (pain).    12/07/2015  . acetaminophen (TYLENOL) 325 MG tablet Take 650 mg by mouth every 6 (six) hours as needed. fever   unknown  . diphenhydrAMINE (BENADRYL) 25 mg capsule Take 25 mg by mouth every 6 (six) hours as needed for allergies.   More Than A Month   Scheduled: . amLODipine  10 mg Oral Daily  . antiseptic oral rinse  7 mL Mouth Rinse BID  . epoetin (EPOGEN/PROCRIT) injection  10,000 Units Intravenous Q T,Th,Sa-HD  . insulin aspart  0-9 Units Subcutaneous TID WC  . metoprolol  25 mg Oral BID  . piperacillin-tazobactam (ZOSYN)  IV  2.25 g Intravenous Q8H   Continuous: . sodium chloride 10 mL/hr at 12/08/15 [id number]  SHF:WYOVZC chloride, sodium chloride, albuterol, ALPRAZolam, alteplase, diphenhydrAMINE, heparin, morphine injection, ondansetron (ZOFRAN) IV, oxyCODONE, sodium chloride, sodium chloride  Assesment: She was admitted with chronic cholecystitis. She's not a good operative candidate. She has multiple other medical problems as listed including diabetes which is pretty well controlled, renal failure now requiring dialysis, morbid obesity with obesity hypoventilation syndrome on BiPAP at home anemia of chronic disease chronic hypoxic respiratory failure and chronic lymphedema Principal Problem:   Chronic cholecystitis Active Problems:   Lymphedema of lower extremity   Diabetes mellitus (HCC)   HTN (hypertension)   Obesity hypoventilation syndrome (HCC)   Chronic diastolic CHF (congestive heart failure) (HCC)   Chronic respiratory failure with hypoxia (HCC)   Anemia of chronic  disease   Renal failure   Protein-calorie malnutrition, severe   Abdominal pain    Plan: I think she is okay to be discharged today on oral antibiotics. She will need to resume dialysis at home.    LOS: 1 day   Mahrukh Seguin L 12/10/2015, 8:46 AM

## 2015-12-10 NOTE — Clinical Social Work Note (Signed)
CSW received a call from [facility] guest services reporting that pt does not qualify for transportation. Pt is aware and states she will work on transportation for tomorrow's appointment. CSW notified Theressa Stamps that pt will start outpatient dialysis tomorrow on normal schedule.   Benay Pike, [place]

## 2015-12-10 NOTE — Discharge Summary (Signed)
Physician Discharge Summary  Patient ID: [name] MRN: [id number] DOB/AGE: 05-16-1966 49 y.o. Primary Care Physician:Khylan Sawyer L, MD Admit date: 12/07/2015 Discharge date: 12/10/2015    Discharge Diagnoses:   Principal Problem:   Chronic cholecystitis Active Problems:   Lymphedema of lower extremity   Diabetes mellitus (HCC)   HTN (hypertension)   Obesity hypoventilation syndrome (HCC)   Chronic diastolic CHF (congestive heart failure) (HCC)   Chronic respiratory failure with hypoxia (HCC)   Anemia of chronic disease   Renal failure   Protein-calorie malnutrition, severe   Abdominal pain     Medication List    TAKE these medications        acetaminophen 325 MG tablet  Commonly known as:  TYLENOL  Take 650 mg by mouth every 6 (six) hours as needed. fever     albuterol (2.5 MG/3ML) 0.083% nebulizer solution  Commonly known as:  PROVENTIL  Take 2.5 mg by nebulization every 6 (six) hours as needed for wheezing or shortness of breath.     ALPRAZolam 0.5 MG tablet  Commonly known as:  XANAX  Take 0.5 mg by mouth 3 (three) times daily as needed for anxiety or sleep.     amLODipine 10 MG tablet  Commonly known as:  NORVASC  Take 10 mg by mouth daily.     amoxicillin 500 MG tablet  Commonly known as:  AMOXIL  Take 1 tablet (500 mg total) by mouth 2 (two) times daily.     diphenhydrAMINE 25 mg capsule  Commonly known as:  BENADRYL  Take 25 mg by mouth every 6 (six) hours as needed for allergies.     insulin NPH-regular Human (70-30) 100 UNIT/ML injection  Commonly known as:  NOVOLIN 70/30  Inject 60 Units into the skin 2 (two) times daily.     metoprolol 50 MG tablet  Commonly known as:  LOPRESSOR  Take 0.5 tablets (25 mg total) by mouth 2 (two) times daily.     Oxycodone HCl 10 MG Tabs  Take 10 mg by mouth 4 (four) times daily as needed (pain).        Discharged Condition: Improved    Consults: Nephrology  Significant Diagnostic  Studies: Ct Abdomen Pelvis Wo Contrast  12/08/2015  CLINICAL DATA:  49 year old female with epigastric pain. Elevated lipase. EXAM: CT ABDOMEN AND PELVIS WITHOUT CONTRAST TECHNIQUE: Multidetector CT imaging of the abdomen and pelvis was performed following the standard protocol without IV contrast. COMPARISON:  CT dated 12/02/2014 FINDINGS: Evaluation of this exam is limited in the absence of intravenous contrast. Evaluation is also limited due to patient's body habitus. Partially visualized small left pleural effusion. Bibasilar linear atelectasis/scarring. No intra-abdominal free air. No free fluid. The liver appears grossly unremarkable. The gallbladder is mildly distended. No calcified stone identified. Ultrasound is recommended for better evaluation of the gallbladder. The pancreas, spleen, and adrenal glands appear unremarkable. There is no hydronephrosis or nephrolithiasis. The urinary bladder is partially distended and appears grossly unremarkable. Hysterectomy. Sigmoid diverticulosis without active inflammation. There is moderate stool throughout the colon. No evidence of bowel obstruction. The appendix is not visualized with certainty. No inflammatory changes identified in the right lower quadrant. The abdominal aorta and IVC appear grossly unremarkable. No portal venous gas identified. Stable appearing top-normal retroperitoneal lymph nodes. Midline vertical anterior pelvic wall incisional scar. There is diffuse subcutaneous soft tissue stranding predominantly involving the anterior pelvic wall. No drainable fluid collection. Degenerative changes of the spine. No acute fractures. IMPRESSION: Mildly distended gallbladder.  Ultrasound is recommended for further evaluation. Constipation.  No evidence of bowel obstruction or inflammation. Sigmoid diverticulosis. No hydronephrosis or nephrolithiasis. Diffuse subcutaneous soft tissue stranding and edema predominantly involving the anterior abdominal wall. No  drainable fluid collection/abscess. Electronically Signed   By: Anner Crete M.D.   On: 12/08/2015 03:15   US Renal  11/17/2015  CLINICAL DATA:  49 year old female with chronic renal failure, beginning dialysis. Subsequent encounter. EXAM: RENAL / URINARY TRACT ULTRASOUND COMPLETE COMPARISON:  Noncontrast CT Abdomen and Pelvis 12/02/2014 FINDINGS: Large body habitus. Right Kidney: Length: 13.6 cm. Echogenicity within normal limits. No mass or hydronephrosis visualized. Left Kidney: Length: Could not be identified despite multiple attempts. Bladder: Decompressed via Foley catheter. IMPRESSION: Negative sonographic appearance of the right kidney. The left kidney could not be visualized despite multiple attempts. Electronically Signed   By: Genevie Ann M.D.   On: 11/17/2015 11:10   Ir Fluoro Guide Cv Line Right  11/29/2015  CLINICAL DATA:  End-stage renal disease EXAM: ULTRASOUND GUIDANCE FOR VASCULAR ACCESS RIGHT INTERNAL JUGULAR PERMANENT HEMODIALYSIS CATHETER Date:  12/10/201612/09/2015 11:37 am Radiologist:  M. Daryll Brod, MD Guidance:  Ultrasound and fluoroscopic FLUOROSCOPY TIME:  0.8 minutes, 7.1 mGy MEDICATIONS AND MEDICAL HISTORY: 2 g Ancef administered within 1 hour of the procedure, 1% lidocaine locally ANESTHESIA/SEDATION: None. CONTRAST:  None COMPLICATIONS: None immediate PROCEDURE: Informed consent was obtained from the patient following explanation of the procedure, risks, benefits and alternatives. The patient understands, agrees and consents for the procedure. All questions were addressed. A time out was performed. Maximal barrier sterile technique utilized including caps, mask, sterile gowns, sterile gloves, large sterile drape, hand hygiene, and 2% chlorhexidine scrub. Under sterile conditions and local anesthesia, right internal jugular micropuncture venous access was performed with ultrasound. Images were obtained for documentation. A guide wire was inserted followed by a transitional  dilator. Next, a 0.035 guidewire was advanced into the IVC with a 5-French catheter. Measurements were obtained from the right venotomy site to the proximal right atrium. In the right infraclavicular chest, a subcutaneous tunnel was created under sterile conditions and local anesthesia. 1% lidocaine with epinephrine was utilized for this. The 23 cm tip to cuff dialysis catheter was tunneled subcutaneously to the venotomy site and inserted into the SVC/RA junction through a valved peel-away sheath. Position was confirmed with fluoroscopy. Images were obtained for documentation. Blood was aspirated from the catheter followed by saline and heparin flushes. The appropriate volume and strength of heparin was instilled in each lumen. Caps were applied. The catheter was secured at the tunnel site with Gelfoam and a pursestring suture. The venotomy site was closed with subcuticular Vicryl suture. Dermabond was applied to the small right neck incision. A dry sterile dressing was applied. The catheter is ready for use. No immediate complications. IMPRESSION: Ultrasound and fluoroscopically guided right internal jugular tunneled hemodialysis catheter (23 cm tip to cuff dialysis catheter). Electronically Signed   By: Jerilynn Mages.  Shick M.D.   On: 11/29/2015 12:13   Ir US Guide Vasc Access Right  11/29/2015  CLINICAL DATA:  End-stage renal disease EXAM: ULTRASOUND GUIDANCE FOR VASCULAR ACCESS RIGHT INTERNAL JUGULAR PERMANENT HEMODIALYSIS CATHETER Date:  12/10/201612/09/2015 11:37 am Radiologist:  M. Daryll Brod, MD Guidance:  Ultrasound and fluoroscopic FLUOROSCOPY TIME:  0.8 minutes, 7.1 mGy MEDICATIONS AND MEDICAL HISTORY: 2 g Ancef administered within 1 hour of the procedure, 1% lidocaine locally ANESTHESIA/SEDATION: None. CONTRAST:  None COMPLICATIONS: None immediate PROCEDURE: Informed consent was obtained from the patient following explanation of the procedure,  risks, benefits and alternatives. The patient understands, agrees  and consents for the procedure. All questions were addressed. A time out was performed. Maximal barrier sterile technique utilized including caps, mask, sterile gowns, sterile gloves, large sterile drape, hand hygiene, and 2% chlorhexidine scrub. Under sterile conditions and local anesthesia, right internal jugular micropuncture venous access was performed with ultrasound. Images were obtained for documentation. A guide wire was inserted followed by a transitional dilator. Next, a 0.035 guidewire was advanced into the IVC with a 5-French catheter. Measurements were obtained from the right venotomy site to the proximal right atrium. In the right infraclavicular chest, a subcutaneous tunnel was created under sterile conditions and local anesthesia. 1% lidocaine with epinephrine was utilized for this. The 23 cm tip to cuff dialysis catheter was tunneled subcutaneously to the venotomy site and inserted into the SVC/RA junction through a valved peel-away sheath. Position was confirmed with fluoroscopy. Images were obtained for documentation. Blood was aspirated from the catheter followed by saline and heparin flushes. The appropriate volume and strength of heparin was instilled in each lumen. Caps were applied. The catheter was secured at the tunnel site with Gelfoam and a pursestring suture. The venotomy site was closed with subcuticular Vicryl suture. Dermabond was applied to the small right neck incision. A dry sterile dressing was applied. The catheter is ready for use. No immediate complications. IMPRESSION: Ultrasound and fluoroscopically guided right internal jugular tunneled hemodialysis catheter (23 cm tip to cuff dialysis catheter). Electronically Signed   By: Jerilynn Mages.  Shick M.D.   On: 11/29/2015 12:13   Dg Chest Portable 1 View  11/13/2015  CLINICAL DATA:  Shortness of Breath EXAM: PORTABLE CHEST 1 VIEW COMPARISON:  December 02, 2014 FINDINGS: Port-A-Cath tip is in the superior vena cava. No pneumothorax. There  is cardiomegaly with pulmonary venous hypertension. There is no frank edema or consolidation. No adenopathy. IMPRESSION: Pulmonary vascular congestion.  No frank edema or consolidation. Electronically Signed   By: Lowella Grip III M.D.   On: 11/13/2015 15:45   US Abdomen Limited Ruq  12/08/2015  CLINICAL DATA:  Abdominal pain and nausea for the past 3 days, distended gallbladder on abdominal CT scan today. EXAM: US ABDOMEN LIMITED - RIGHT UPPER QUADRANT COMPARISON:  CT scan of the abdomen dated December 08, 2015 FINDINGS: Gallbladder: The gallbladder is mildly distended. There is wall thickening to 3.6 mm. Sludge is present but no stones are evident. There is a small amount of pericholecystic fluid. There is no positive sonographic Murphy's sign. Common bile duct: Diameter: 7.3-7.9 mm.  No intraluminal echoes are observed. Liver: The hepatic echotexture is mildly increased. There is no focal mass or ductal dilation demonstrated on this ultrasound. IMPRESSION: 1. Gallbladder distention, minimal wall thickening, sludge, and pericholecystic fluid vs trace ascites. This could reflect acute or chronic cholecystitis. The lack of a positive sonographic Murphy's sign favors chronic cholecystitis. 2. Increased hepatic echotexture is consistent with fatty infiltrative change. Electronically Signed   By: David  Martinique M.D.   On: 12/08/2015 09:49    Lab Results: Basic Metabolic Panel:  Recent Labs  12/09/15 0602 12/10/15 0551  NA 136 133*  K 4.8 4.1  CL 98* 94*  CO2 28 31  GLUCOSE 187* 169*  BUN 41* 30*  CREATININE 4.75* 4.07*  CALCIUM 8.6* 8.1*  PHOS 5.0*  --    Liver Function Tests:  Recent Labs  12/07/15 2222  AST 15  ALT 7*  ALKPHOS 131*  BILITOT 0.8  PROT 7.5  ALBUMIN 3.0*  CBC:  Recent Labs  12/07/15 2222  12/09/15 0602  12/09/15 2112 12/10/15 0551  WBC 10.5  < > 25.9*  --   --  24.1*  NEUTROABS 8.7*  --   --   --   --  20.7*  HGB 10.2*  < > 9.9*  < > 9.5* 9.0*  HCT  32.3*  < > 31.3*  < > 29.2* 27.9*  MCV 89.0  < > 87.7  --   --  87.2  PLT 268  < > 253  --   --  278  < > = values in this interval not displayed.  No results found for this or any previous visit (from the past 240 hour(s)).   Hospital Course: This is a 49 year old with multiple medical problems who came to the emergency department because of abdominal pain. She has recently been started on dialysis for end-stage renal disease. During her last hospitalization she suffered cardiopulmonary arrest when she received sedation to have a permacath placed. CT scan was suggestive of chronic cholecystitis and ultrasound confirmed. She is a very poor operative candidate. She improved with antibiotics and by the time of discharge was back at baseline. She is able to eat is not complaining of abdominal pain.  Discharge Exam: Blood pressure 132/72, pulse 77, temperature 98.2 F (36.8 C), temperature source Oral, resp. rate 20, height _0  (1.651 m), weight 179.624 kg (396 lb), SpO2 94 %. She is awake and alert. She is morbidly obese. Her chest is clear with diminished breath sounds. Her abdomen is soft she has chronic lymphedema of both lower extremities  Disposition: Home with home health services      Discharge Instructions    Discharge patient    Complete by:  As directed      Face-to-face encounter (required for Medicare/Medicaid patients)    Complete by:  As directed   I Ashlyn Cabler L certify that this patient is under my care and that I, or a nurse practitioner or physician's assistant working with me, had a face-to-face encounter that meets the physician face-to-face encounter requirements with this patient on 12/10/2015. The encounter with the patient was in whole, or in part for the following medical condition(s) which is the primary reason for home health care (List medical condition): Chronic cholecystitis/morbid obesity/chronic renal failure/chronic respiratory failure  The encounter with  the patient was in whole, or in part, for the following medical condition, which is the primary reason for home health care:  Chronic cholecystitis/morbid obesity/chronic renal failure/chronic respiratory failure  I certify that, based on my findings, the following services are medically necessary home health services:   Nursing Physical therapy    Reason for Medically Necessary Home Health Services:  Skilled Nursing- Change/Decline in Patient Status  My clinical findings support the need for the above services:  Shortness of breath with activity  Further, I certify that my clinical findings support that this patient is homebound due to:  Shortness of Breath with activity     Home Health    Complete by:  As directed   To provide the following care/treatments:   RN PT               Signed: Joanna Borawski L   12/10/2015, 9:06 AM

## 2015-12-10 NOTE — Progress Notes (Signed)
Patient discharging home.  Port deaccessed - WNL.  Reviewed DC instructions and meds.  Instructed to follow up with PCP.  Aware of plans for dialysis tomorrow.  Verbalizes understanding.  Awaiting arrival of EMS for transport.

## 2015-12-10 NOTE — Progress Notes (Signed)
[name]  MRN: [id number]  DOB/AGE: 1966/04/28 49 y.o.  Primary Care Physician:HAWKINS,EDWARD L, MD  Admit date: 12/07/2015  Chief Complaint:  Chief Complaint  Patient presents with  . Abdominal Pain    S-Pt presented on  12/07/2015 with  Chief Complaint  Patient presents with  . Abdominal Pain  .    Pt offers no new concerns.Pt says " her belly pain is better, I will take my antibiotics and I will be better"  meds . amLODipine  10 mg Oral Daily  . antiseptic oral rinse  7 mL Mouth Rinse BID  . epoetin (EPOGEN/PROCRIT) injection  10,000 Units Intravenous Q T,Th,Sa-HD  . insulin aspart  0-9 Units Subcutaneous TID WC  . metoprolol  25 mg Oral BID  . piperacillin-tazobactam (ZOSYN)  IV  2.25 g Intravenous Q8H     Physical Exam: Vital signs in last 24 hours: Temp:  [98.1 F (36.7 C)-98.4 F (36.9 C)] 98.2 F (36.8 C) (12/21 5284) Pulse Rate:  [70-79] 77 (12/21 0611) Resp:  [20] 20 (12/21 0611) BP: (118-163)/(62-90) 132/72 mmHg (12/21 0611) SpO2:  [93 %-95 %] 94 % (12/21 0611) Weight:  [396 lb (179.624 kg)-418 lb (189.604 kg)] 396 lb (179.624 kg) (12/20 1715) Weight change:  Last BM Date: 12/08/15  Intake/Output from previous day: 12/20 0701 - 12/21 0700 In: 810 [P.O.:340; I.V.:370; IV Piggyback:100] Out: 3900 [Urine:100]     Physical Exam: General- pt is awake,alert, following commands. Resp- No acute REsp distress, decreased Bs at bases CVS- S1S2 regular in rate and rhythm GIT- BS+, soft, NT, Morbidly obese EXT- Chronic  LE lympheedema, NO Cyanosis, chronic venous stasis changes Access- Tunelled cath in situ  Lab Results: CBC    Component Value Date/Time   WBC 24.1* 12/10/2015 0551   RBC 3.20* 12/10/2015 0551   HGB 9.0* 12/10/2015 0551   HCT 27.9* 12/10/2015 0551   PLT 278 12/10/2015 0551   MCV 87.2 12/10/2015 0551   MCH 28.1 12/10/2015 0551   MCHC 32.3 12/10/2015 0551   RDW 17.3* 12/10/2015 0551   LYMPHSABS 1.6 12/10/2015 0551   MONOABS  1.8* 12/10/2015 0551   EOSABS 0.0 12/10/2015 0551   BASOSABS 0.0 12/10/2015 0551       BMET  Recent Labs  12/09/15 0602 12/10/15 0551  NA 136 133*  K 4.8 4.1  CL 98* 94*  CO2 28 31  GLUCOSE 187* 169*  BUN 41* 30*  CREATININE 4.75* 4.07*  CALCIUM 8.6* 8.1*     MICRO No results found for this or any previous visit (from the past 240 hour(s)).    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 Phosphorus now at goal. Calcium when corrected for low albumin is  at goal.  5)IGI- pt with chronic cholecystitis Primary MD following  6)Electrolytes Hyperkalemic    Started on  Lasix    GIven Kayexalate     Later was on renal replacment therapy      Now better  Hyponatremic   stable   7)Acid base Co2 at goal     Plan:  Will continue current care Will dialyze in am if still inpt otherwisew will go to outpt chair     Thaily Hackworth S 12/10/2015, 8:02 AM

## 2015-12-10 NOTE — Clinical Social Work Note (Signed)
[facility] called and reports pt's chair time has changed to 10:50 and for first appointment tomorrow, pt will need to arrive at 10:00. Pt notified. She is requesting transport home via [facility]. CSW confirmed address and that family would be present upon arrival.  Benay Pike, [place]

## 2015-12-10 NOTE — Care Management Note (Signed)
Case Management Note  Patient Details  Name: [name] MRN: [id number] Date of Birth: 05/06/1966  Subjective/Objective:                  Pt is from home, lives with her husband and requires assistance with ADL's. Pt was active with [facility] services through [facility] prior to admission. Pt has home O2 and BiPAP through [facility] prior to admission. Pt will begin OP HD on Thursday at [facility] in [place], [facility] working with pt also.   Action/Plan: Pt discharging home today with resumption of [facility] services with the addition of PT. Pt aware if Medicaid is approved she will not longer be able to receive PT services. Pt aware HH has 48 hours to resume services. Pt being transported via EMS. Pt aware of HD schedule.   Expected Discharge Date:   12/10/2015               Expected Discharge Plan:  [place]  In-House Referral:  Financial Counselor, Clinical Social Work  Discharge planning Services  CM Consult  Post Acute Care Choice:  Resumption of Svcs/PTA Provider Choice offered to:  Patient  DME Arranged:    DME Agency:     HH Arranged:  RN, PT [place] Agency:  [facility]  Status of Service:  Completed, signed off  Medicare Important Message Given:    Date Medicare IM Given:    Medicare IM give by:    Date Additional Medicare IM Given:    Additional Medicare Important Message give by:     If discussed at [place] of Stay Meetings, dates discussed:    Additional Comments:  Sherald Barge, RN 12/10/2015, 10:16 AM

## 2015-12-17 DIAGNOSIS — N186 End stage renal disease: Secondary | ICD-10-CM | POA: Diagnosis not present

## 2015-12-17 DIAGNOSIS — Z992 Dependence on renal dialysis: Secondary | ICD-10-CM | POA: Diagnosis not present

## 2015-12-22 NOTE — Discharge Summary (Deleted)
NAME:  [name], [name]                   ACCOUNT NO.:  MEDICAL RECORD NO.:  [id number]  LOCATION:                                 FACILITY:  PHYSICIAN:  Adeleine Pask L. [name], M.D.DATE OF BIRTH:  November 18, 1966  DATE OF ADMISSION: DATE OF DISCHARGE:  12/16/2016LH                              DISCHARGE SUMMARY   ADDENDUM  DISCHARGE DIAGNOSES:  End stage renal disease.     Malcome Ambrocio L. [name], M.D.     ELH/MEDQ  D:  12/20/2015  T:  12/20/2015  Job:  [id number]

## 2015-12-22 NOTE — Discharge Summary (Signed)
NAME:  [name], [name]                   ACCOUNT NO.:  MEDICAL RECORD NO.:  [id number]  LOCATION:                                 FACILITY:  PHYSICIAN:  Sanaii Caporaso L. [name], M.D.DATE OF BIRTH:  November 18, 1966  DATE OF ADMISSION: DATE OF DISCHARGE:  12/16/2016LH                              DISCHARGE SUMMARY   ADDENDUM  DISCHARGE DIAGNOSES:  End stage renal disease.     [name] L. [name], M.D.     ELH/MEDQ  D:  12/20/2015  T:  12/20/2015  Job:  [id number]

## 2016-01-06 ENCOUNTER — Other Ambulatory Visit: Payer: Self-pay | Admitting: Interventional Radiology

## 2016-01-20 DIAGNOSIS — N186 End stage renal disease: Secondary | ICD-10-CM | POA: Diagnosis not present

## 2016-01-20 DIAGNOSIS — Z992 Dependence on renal dialysis: Secondary | ICD-10-CM | POA: Diagnosis not present

## 2016-02-17 DIAGNOSIS — N186 End stage renal disease: Secondary | ICD-10-CM | POA: Diagnosis not present

## 2016-02-17 DIAGNOSIS — Z992 Dependence on renal dialysis: Secondary | ICD-10-CM | POA: Diagnosis not present

## 2016-02-18 ENCOUNTER — Emergency Department (HOSPITAL_COMMUNITY)
Admission: EM | Admit: 2016-02-18 | Discharge: 2016-02-18 | Disposition: A | Discharge status: Home / Self Care | Payer: Medicare Other | Attending: Emergency Medicine | Admitting: Emergency Medicine

## 2016-02-18 ENCOUNTER — Encounter (HOSPITAL_COMMUNITY): Payer: Self-pay | Admitting: Emergency Medicine

## 2016-02-18 ENCOUNTER — Emergency Department (HOSPITAL_COMMUNITY): Payer: Medicare Other

## 2016-02-18 DIAGNOSIS — I2510 Atherosclerotic heart disease of native coronary artery without angina pectoris: Secondary | ICD-10-CM | POA: Insufficient documentation

## 2016-02-18 DIAGNOSIS — R2241 Localized swelling, mass and lump, right lower limb: Secondary | ICD-10-CM | POA: Diagnosis present

## 2016-02-18 DIAGNOSIS — M7989 Other specified soft tissue disorders: Secondary | ICD-10-CM | POA: Diagnosis not present

## 2016-02-18 DIAGNOSIS — I10 Essential (primary) hypertension: Secondary | ICD-10-CM | POA: Diagnosis not present

## 2016-02-18 DIAGNOSIS — M79605 Pain in left leg: Secondary | ICD-10-CM | POA: Diagnosis not present

## 2016-02-18 DIAGNOSIS — M79651 Pain in right thigh: Secondary | ICD-10-CM | POA: Insufficient documentation

## 2016-02-18 DIAGNOSIS — G8929 Other chronic pain: Secondary | ICD-10-CM | POA: Insufficient documentation

## 2016-02-18 DIAGNOSIS — Z794 Long term (current) use of insulin: Secondary | ICD-10-CM | POA: Diagnosis not present

## 2016-02-18 DIAGNOSIS — Z9289 Personal history of other medical treatment: Secondary | ICD-10-CM | POA: Diagnosis not present

## 2016-02-18 DIAGNOSIS — Z9889 Other specified postprocedural states: Secondary | ICD-10-CM | POA: Diagnosis not present

## 2016-02-18 DIAGNOSIS — E7800 Pure hypercholesterolemia, unspecified: Secondary | ICD-10-CM | POA: Diagnosis not present

## 2016-02-18 DIAGNOSIS — R279 Unspecified lack of coordination: Secondary | ICD-10-CM | POA: Diagnosis not present

## 2016-02-18 DIAGNOSIS — Z992 Dependence on renal dialysis: Secondary | ICD-10-CM | POA: Insufficient documentation

## 2016-02-18 DIAGNOSIS — E119 Type 2 diabetes mellitus without complications: Secondary | ICD-10-CM | POA: Insufficient documentation

## 2016-02-18 DIAGNOSIS — J449 Chronic obstructive pulmonary disease, unspecified: Secondary | ICD-10-CM | POA: Diagnosis not present

## 2016-02-18 DIAGNOSIS — R52 Pain, unspecified: Secondary | ICD-10-CM | POA: Diagnosis not present

## 2016-02-18 DIAGNOSIS — Z7401 Bed confinement status: Secondary | ICD-10-CM | POA: Diagnosis not present

## 2016-02-18 HISTORY — DX: Disorder of kidney and ureter, unspecified: N28.9

## 2016-02-18 MED ORDER — OXYCODONE HCL 5 MG PO TABS
10.0000 mg | ORAL_TABLET | Freq: Once | ORAL | Status: AC
  Administered 2016-02-18: 10 mg via ORAL
  Filled 2016-02-18: qty 2

## 2016-02-18 MED ORDER — CEPHALEXIN 500 MG PO CAPS: 500.0000 mg | ORAL_CAPSULE | Freq: Four times a day (QID) | ORAL | Status: DC

## 2016-02-18 NOTE — ED Provider Notes (Signed)
CSN: [id number]     Arrival date & time 02/18/16  1138 History   First MD Initiated Contact with Patient 02/18/16 1336     Chief Complaint  Patient presents with  . Abscess     (Consider location/radiation/quality/duration/timing/severity/associated sxs/prior Treatment) HPI this is a 50 year old female who is morbidly obese and is on dialysis who presents today complaining of some swelling and pain in her right medial thigh. She has had some cellulitis in the past. She states that she is concerned about an abscess and a clot. This has been present for several days. She has not noted any redness, warmth, or fever. She denies history of previous clots. She states that the pain is moderate and is worsened with palpation or movement. She denies any chest pain or shortness of breath.  Past Medical History  Diagnosis Date  . Diabetes mellitus   . Chronic pain   . Lymph edema   . Cellulitis   . Chiari malformation     s/p surgery  . S/P colonoscopy 05/26/2007    Dr. Laural Golden sigmoid diverticulosis random biopsies benign  . CAD (coronary artery disease)   . Hypercholesterolemia   . Hypertension   . COPD (chronic obstructive pulmonary disease) ([place])   . S/P endoscopy 05/01/2009    Dr. Penelope Coop pill-induced esophageal ulcerations distal to midesophagus, 2 small ulcers in the antrum of the stomach  . Fibromyalgia   . On home O2   . Obesity hypoventilation syndrome ([place])   . Hx of echocardiogram 10/2011    EF 55-60%  . Renal disorder     Pt started dialysis in Dec.   Past Surgical History  Procedure Laterality Date  . Cesarean section       x 2  . Abdominal hysterectomy    . Tonsillectomy    . Adenoidectomy    . Thyroidectomy, partial    . Craniectomy suboccipital w/ cervical laminectomy / chiari    . Portacath placement  07/05/2012    Procedure: INSERTION PORT-A-CATH;  Surgeon: Donato Heinz, MD;  Location: [facility];  Service: General;  Laterality: Left;  subclavian   Family History    Problem Relation Age of Onset  . Colon cancer Mother 72  . Stroke Mother 73  . Coronary artery disease Mother    Social History  Substance Use Topics  . Smoking status: Never Smoker   . Smokeless tobacco: Never Used  . Alcohol Use: No   OB History    Gravida Para Term Preterm AB TAB SAB Ectopic Multiple Living   _0 Review of Systems  All other systems reviewed and are negative.     Allergies  Contrast media; Pineapple; Vancomycin; Pneumococcal vaccines; and Shellfish allergy  Home Medications   Prior to Admission medications   Medication Sig Start Date End Date Taking? Authorizing Provider  acetaminophen (TYLENOL) 325 MG tablet Take 650 mg by mouth every 6 (six) hours as needed. fever   Yes Historical Provider, MD  albuterol (PROVENTIL) (2.5 MG/3ML) 0.083% nebulizer solution Take 2.5 mg by nebulization every 6 (six) hours as needed for wheezing or shortness of breath.   Yes Historical Provider, MD  ALPRAZolam Duanne Moron) 0.5 MG tablet Take 0.5 mg by mouth 3 (three) times daily as needed for anxiety or sleep.    Yes Historical Provider, MD  amLODipine (NORVASC) 10 MG tablet Take 10 mg by mouth daily.   Yes Historical Provider, MD  diphenhydrAMINE (BENADRYL) 25 mg capsule Take 25 mg by mouth every 6 (six) hours as needed for allergies.   Yes Historical Provider, MD  insulin NPH-insulin regular (NOVOLIN 70/30) (70-30) 100 UNIT/ML injection Inject 60 Units into the skin 2 (two) times daily.   Yes Historical Provider, MD  metoprolol (LOPRESSOR) 50 MG tablet Take 0.5 tablets (25 mg total) by mouth 2 (two) times daily. 12/04/15  Yes [name], MD  Multiple Vitamins-Minerals (MULTIVITAMIN WITH MINERALS) tablet Take 1 tablet by mouth daily.   Yes Historical Provider, MD  Oxycodone HCl 10 MG TABS Take 10 mg by mouth 4 (four) times daily as needed (pain).    Yes Historical Provider, MD  amoxicillin (AMOXIL) 500 MG tablet Take 1 tablet (500 mg total) by mouth 2 (two)  times daily. Patient not taking: Reported on 02/18/2016 12/10/15   [name], MD   BP 122/80 mmHg  Pulse 74  Temp(Src) 98.6 F (37 C) (Oral)  Resp 20  Ht _0  (1.651 m)  Wt 131.543 kg  BMI 48.26 kg/m2  SpO2 98% Physical Exam  Constitutional: She is oriented to person, place, and time. She appears well-developed and well-nourished. No distress.  Morbidly obese  HENT:  Head: Normocephalic and atraumatic.  Right Ear: External ear normal.  Left Ear: External ear normal.  Nose: Nose normal.  Eyes: Conjunctivae and EOM are normal. Pupils are equal, round, and reactive to light.  Neck: Normal range of motion. Neck supple.  Pulmonary/Chest: Effort normal.  Musculoskeletal: Normal range of motion. She exhibits tenderness.       Legs: Tender firm area noted medial aspect of right thigh. It is nonfluctuant and not warm. Does not appear to be consistent with abscess.  Neurological: She is alert and oriented to person, place, and time. She exhibits normal muscle tone. Coordination normal.  Skin: Skin is warm and dry.  Psychiatric: She has a normal mood and affect. Her behavior is normal. Thought content normal.  Nursing note and vitals reviewed.   ED Course  Procedures (including critical care time) Labs Review Labs Reviewed - No data to display  Imaging Review US Venous Img Lower Unilateral Right  02/18/2016  CLINICAL DATA:  Right leg pain and swelling for 6 days. History of cellulitis. EXAM: RIGHT LOWER EXTREMITY VENOUS DOPPLER ULTRASOUND TECHNIQUE: Gray-scale sonography with graded compression, as well as color Doppler and duplex ultrasound, were performed to evaluate the deep venous system from the level of the common femoral vein through the popliteal and proximal calf veins. Spectral Doppler was utilized to evaluate flow at rest and with distal augmentation maneuvers. COMPARISON:  None. FINDINGS: Left common femoral artery is patent without thrombus. The left saphenofemoral  junction is patent. Normal compressibility, augmentation and color Doppler flow in the right common femoral vein, right femoral vein and right popliteal vein. The right saphenofemoral junction is patent. Right profunda femoral vein is patent without thrombus. Limited evaluation of the right calf veins but no clear evidence for thrombus. Visualized right great saphenous vein is patent. IMPRESSION: Negative for deep venous thrombosis in right lower extremity. Limited evaluation of the right calf as described. Electronically Signed   By: Markus Daft M.D.   On: 02/18/2016 14:58   I have personally reviewed and evaluated these images and lab results as part of my medical decision-making.   MDM   Final diagnoses:  Pain of right thigh   Pain right medial thigh of unclear etiology.  [place] negative for dvt.  Plan warm compresses  and elevation.  Plan antibiotic and close follow up.     Pattricia Boss, MD 02/18/16 [phone]

## 2016-02-18 NOTE — ED Notes (Signed)
Pt had requested that EMS be called to transport her home. Pt aware that it would be a while because there are no trucks to transport

## 2016-02-18 NOTE — ED Notes (Signed)
Pt reports swelling and tenderness to the R inner, upper thigh. Onset of symptoms 1 week ago. Pt reports fever night before last but none today.

## 2016-02-18 NOTE — Discharge Instructions (Signed)

## 2016-02-18 NOTE — ED Notes (Signed)
Pt received morphine en route given by [facility]

## 2016-02-19 DIAGNOSIS — D631 Anemia in chronic kidney disease: Secondary | ICD-10-CM | POA: Diagnosis not present

## 2016-02-19 DIAGNOSIS — D509 Iron deficiency anemia, unspecified: Secondary | ICD-10-CM | POA: Diagnosis not present

## 2016-02-19 DIAGNOSIS — R110 Nausea: Secondary | ICD-10-CM | POA: Diagnosis not present

## 2016-02-19 DIAGNOSIS — R1110 Vomiting, unspecified: Secondary | ICD-10-CM | POA: Diagnosis not present

## 2016-02-19 DIAGNOSIS — N186 End stage renal disease: Secondary | ICD-10-CM | POA: Diagnosis not present

## 2016-02-19 DIAGNOSIS — Z23 Encounter for immunization: Secondary | ICD-10-CM | POA: Diagnosis not present

## 2016-02-19 DIAGNOSIS — Z992 Dependence on renal dialysis: Secondary | ICD-10-CM | POA: Diagnosis not present

## 2016-02-21 DIAGNOSIS — D631 Anemia in chronic kidney disease: Secondary | ICD-10-CM | POA: Diagnosis not present

## 2016-02-21 DIAGNOSIS — R110 Nausea: Secondary | ICD-10-CM | POA: Diagnosis not present

## 2016-02-21 DIAGNOSIS — N186 End stage renal disease: Secondary | ICD-10-CM | POA: Diagnosis not present

## 2016-02-21 DIAGNOSIS — D509 Iron deficiency anemia, unspecified: Secondary | ICD-10-CM | POA: Diagnosis not present

## 2016-02-21 DIAGNOSIS — Z992 Dependence on renal dialysis: Secondary | ICD-10-CM | POA: Diagnosis not present

## 2016-02-21 DIAGNOSIS — Z23 Encounter for immunization: Secondary | ICD-10-CM | POA: Diagnosis not present

## 2016-02-24 ENCOUNTER — Other Ambulatory Visit: Payer: Self-pay

## 2016-02-24 DIAGNOSIS — N186 End stage renal disease: Secondary | ICD-10-CM

## 2016-02-24 DIAGNOSIS — D631 Anemia in chronic kidney disease: Secondary | ICD-10-CM | POA: Diagnosis not present

## 2016-02-24 DIAGNOSIS — D509 Iron deficiency anemia, unspecified: Secondary | ICD-10-CM | POA: Diagnosis not present

## 2016-02-24 DIAGNOSIS — Z01810 Encounter for preprocedural cardiovascular examination: Secondary | ICD-10-CM

## 2016-02-24 DIAGNOSIS — R110 Nausea: Secondary | ICD-10-CM | POA: Diagnosis not present

## 2016-02-24 DIAGNOSIS — Z992 Dependence on renal dialysis: Secondary | ICD-10-CM | POA: Diagnosis not present

## 2016-02-24 DIAGNOSIS — Z23 Encounter for immunization: Secondary | ICD-10-CM | POA: Diagnosis not present

## 2016-02-26 DIAGNOSIS — N186 End stage renal disease: Secondary | ICD-10-CM | POA: Diagnosis not present

## 2016-02-26 DIAGNOSIS — Z992 Dependence on renal dialysis: Secondary | ICD-10-CM | POA: Diagnosis not present

## 2016-02-26 DIAGNOSIS — D631 Anemia in chronic kidney disease: Secondary | ICD-10-CM | POA: Diagnosis not present

## 2016-02-26 DIAGNOSIS — Z23 Encounter for immunization: Secondary | ICD-10-CM | POA: Diagnosis not present

## 2016-02-26 DIAGNOSIS — R110 Nausea: Secondary | ICD-10-CM | POA: Diagnosis not present

## 2016-02-26 DIAGNOSIS — D509 Iron deficiency anemia, unspecified: Secondary | ICD-10-CM | POA: Diagnosis not present

## 2016-02-28 DIAGNOSIS — Z23 Encounter for immunization: Secondary | ICD-10-CM | POA: Diagnosis not present

## 2016-02-28 DIAGNOSIS — N186 End stage renal disease: Secondary | ICD-10-CM | POA: Diagnosis not present

## 2016-02-28 DIAGNOSIS — Z992 Dependence on renal dialysis: Secondary | ICD-10-CM | POA: Diagnosis not present

## 2016-02-28 DIAGNOSIS — R110 Nausea: Secondary | ICD-10-CM | POA: Diagnosis not present

## 2016-02-28 DIAGNOSIS — D509 Iron deficiency anemia, unspecified: Secondary | ICD-10-CM | POA: Diagnosis not present

## 2016-02-28 DIAGNOSIS — D631 Anemia in chronic kidney disease: Secondary | ICD-10-CM | POA: Diagnosis not present

## 2016-03-01 DIAGNOSIS — M797 Fibromyalgia: Secondary | ICD-10-CM | POA: Diagnosis not present

## 2016-03-01 DIAGNOSIS — N186 End stage renal disease: Secondary | ICD-10-CM | POA: Diagnosis not present

## 2016-03-01 DIAGNOSIS — I5032 Chronic diastolic (congestive) heart failure: Secondary | ICD-10-CM | POA: Diagnosis not present

## 2016-03-01 DIAGNOSIS — D631 Anemia in chronic kidney disease: Secondary | ICD-10-CM | POA: Diagnosis not present

## 2016-03-01 DIAGNOSIS — L03115 Cellulitis of right lower limb: Secondary | ICD-10-CM | POA: Diagnosis not present

## 2016-03-01 DIAGNOSIS — E662 Morbid (severe) obesity with alveolar hypoventilation: Secondary | ICD-10-CM | POA: Diagnosis not present

## 2016-03-01 DIAGNOSIS — E1122 Type 2 diabetes mellitus with diabetic chronic kidney disease: Secondary | ICD-10-CM | POA: Diagnosis not present

## 2016-03-01 DIAGNOSIS — J449 Chronic obstructive pulmonary disease, unspecified: Secondary | ICD-10-CM | POA: Diagnosis not present

## 2016-03-01 DIAGNOSIS — I132 Hypertensive heart and chronic kidney disease with heart failure and with stage 5 chronic kidney disease, or end stage renal disease: Secondary | ICD-10-CM | POA: Diagnosis not present

## 2016-03-01 DIAGNOSIS — I2510 Atherosclerotic heart disease of native coronary artery without angina pectoris: Secondary | ICD-10-CM | POA: Diagnosis not present

## 2016-03-01 DIAGNOSIS — I890 Lymphedema, not elsewhere classified: Secondary | ICD-10-CM | POA: Diagnosis not present

## 2016-03-01 DIAGNOSIS — Z992 Dependence on renal dialysis: Secondary | ICD-10-CM | POA: Diagnosis not present

## 2016-03-03 DIAGNOSIS — L03115 Cellulitis of right lower limb: Secondary | ICD-10-CM | POA: Diagnosis not present

## 2016-03-03 DIAGNOSIS — I132 Hypertensive heart and chronic kidney disease with heart failure and with stage 5 chronic kidney disease, or end stage renal disease: Secondary | ICD-10-CM | POA: Diagnosis not present

## 2016-03-03 DIAGNOSIS — I890 Lymphedema, not elsewhere classified: Secondary | ICD-10-CM | POA: Diagnosis not present

## 2016-03-03 DIAGNOSIS — E1122 Type 2 diabetes mellitus with diabetic chronic kidney disease: Secondary | ICD-10-CM | POA: Diagnosis not present

## 2016-03-03 DIAGNOSIS — N186 End stage renal disease: Secondary | ICD-10-CM | POA: Diagnosis not present

## 2016-03-03 DIAGNOSIS — I5032 Chronic diastolic (congestive) heart failure: Secondary | ICD-10-CM | POA: Diagnosis not present

## 2016-03-04 DIAGNOSIS — R110 Nausea: Secondary | ICD-10-CM | POA: Diagnosis not present

## 2016-03-04 DIAGNOSIS — Z992 Dependence on renal dialysis: Secondary | ICD-10-CM | POA: Diagnosis not present

## 2016-03-04 DIAGNOSIS — N186 End stage renal disease: Secondary | ICD-10-CM | POA: Diagnosis not present

## 2016-03-04 DIAGNOSIS — Z23 Encounter for immunization: Secondary | ICD-10-CM | POA: Diagnosis not present

## 2016-03-04 DIAGNOSIS — D509 Iron deficiency anemia, unspecified: Secondary | ICD-10-CM | POA: Diagnosis not present

## 2016-03-04 DIAGNOSIS — D631 Anemia in chronic kidney disease: Secondary | ICD-10-CM | POA: Diagnosis not present

## 2016-03-05 DIAGNOSIS — N186 End stage renal disease: Secondary | ICD-10-CM | POA: Diagnosis not present

## 2016-03-05 DIAGNOSIS — E1122 Type 2 diabetes mellitus with diabetic chronic kidney disease: Secondary | ICD-10-CM | POA: Diagnosis not present

## 2016-03-05 DIAGNOSIS — I132 Hypertensive heart and chronic kidney disease with heart failure and with stage 5 chronic kidney disease, or end stage renal disease: Secondary | ICD-10-CM | POA: Diagnosis not present

## 2016-03-05 DIAGNOSIS — L03115 Cellulitis of right lower limb: Secondary | ICD-10-CM | POA: Diagnosis not present

## 2016-03-05 DIAGNOSIS — I890 Lymphedema, not elsewhere classified: Secondary | ICD-10-CM | POA: Diagnosis not present

## 2016-03-05 DIAGNOSIS — I5032 Chronic diastolic (congestive) heart failure: Secondary | ICD-10-CM | POA: Diagnosis not present

## 2016-03-06 DIAGNOSIS — D631 Anemia in chronic kidney disease: Secondary | ICD-10-CM | POA: Diagnosis not present

## 2016-03-06 DIAGNOSIS — Z992 Dependence on renal dialysis: Secondary | ICD-10-CM | POA: Diagnosis not present

## 2016-03-06 DIAGNOSIS — D509 Iron deficiency anemia, unspecified: Secondary | ICD-10-CM | POA: Diagnosis not present

## 2016-03-06 DIAGNOSIS — N186 End stage renal disease: Secondary | ICD-10-CM | POA: Diagnosis not present

## 2016-03-06 DIAGNOSIS — Z23 Encounter for immunization: Secondary | ICD-10-CM | POA: Diagnosis not present

## 2016-03-06 DIAGNOSIS — R110 Nausea: Secondary | ICD-10-CM | POA: Diagnosis not present

## 2016-03-07 DIAGNOSIS — I132 Hypertensive heart and chronic kidney disease with heart failure and with stage 5 chronic kidney disease, or end stage renal disease: Secondary | ICD-10-CM | POA: Diagnosis not present

## 2016-03-07 DIAGNOSIS — L03115 Cellulitis of right lower limb: Secondary | ICD-10-CM | POA: Diagnosis not present

## 2016-03-07 DIAGNOSIS — N186 End stage renal disease: Secondary | ICD-10-CM | POA: Diagnosis not present

## 2016-03-07 DIAGNOSIS — I890 Lymphedema, not elsewhere classified: Secondary | ICD-10-CM | POA: Diagnosis not present

## 2016-03-07 DIAGNOSIS — E1122 Type 2 diabetes mellitus with diabetic chronic kidney disease: Secondary | ICD-10-CM | POA: Diagnosis not present

## 2016-03-07 DIAGNOSIS — I5032 Chronic diastolic (congestive) heart failure: Secondary | ICD-10-CM | POA: Diagnosis not present

## 2016-03-08 ENCOUNTER — Encounter: Payer: Self-pay | Admitting: Surgery

## 2016-03-08 DIAGNOSIS — E1122 Type 2 diabetes mellitus with diabetic chronic kidney disease: Secondary | ICD-10-CM | POA: Diagnosis not present

## 2016-03-08 DIAGNOSIS — I5032 Chronic diastolic (congestive) heart failure: Secondary | ICD-10-CM | POA: Diagnosis not present

## 2016-03-08 DIAGNOSIS — N186 End stage renal disease: Secondary | ICD-10-CM | POA: Diagnosis not present

## 2016-03-08 DIAGNOSIS — I132 Hypertensive heart and chronic kidney disease with heart failure and with stage 5 chronic kidney disease, or end stage renal disease: Secondary | ICD-10-CM | POA: Diagnosis not present

## 2016-03-08 DIAGNOSIS — I890 Lymphedema, not elsewhere classified: Secondary | ICD-10-CM | POA: Diagnosis not present

## 2016-03-08 DIAGNOSIS — L03115 Cellulitis of right lower limb: Secondary | ICD-10-CM | POA: Diagnosis not present

## 2016-03-09 DIAGNOSIS — R110 Nausea: Secondary | ICD-10-CM | POA: Diagnosis not present

## 2016-03-09 DIAGNOSIS — Z992 Dependence on renal dialysis: Secondary | ICD-10-CM | POA: Diagnosis not present

## 2016-03-09 DIAGNOSIS — Z23 Encounter for immunization: Secondary | ICD-10-CM | POA: Diagnosis not present

## 2016-03-09 DIAGNOSIS — N186 End stage renal disease: Secondary | ICD-10-CM | POA: Diagnosis not present

## 2016-03-09 DIAGNOSIS — D509 Iron deficiency anemia, unspecified: Secondary | ICD-10-CM | POA: Diagnosis not present

## 2016-03-09 DIAGNOSIS — D631 Anemia in chronic kidney disease: Secondary | ICD-10-CM | POA: Diagnosis not present

## 2016-03-11 DIAGNOSIS — D631 Anemia in chronic kidney disease: Secondary | ICD-10-CM | POA: Diagnosis not present

## 2016-03-11 DIAGNOSIS — N186 End stage renal disease: Secondary | ICD-10-CM | POA: Diagnosis not present

## 2016-03-11 DIAGNOSIS — Z992 Dependence on renal dialysis: Secondary | ICD-10-CM | POA: Diagnosis not present

## 2016-03-11 DIAGNOSIS — D509 Iron deficiency anemia, unspecified: Secondary | ICD-10-CM | POA: Diagnosis not present

## 2016-03-11 DIAGNOSIS — Z23 Encounter for immunization: Secondary | ICD-10-CM | POA: Diagnosis not present

## 2016-03-11 DIAGNOSIS — R110 Nausea: Secondary | ICD-10-CM | POA: Diagnosis not present

## 2016-03-12 ENCOUNTER — Ambulatory Visit (HOSPITAL_COMMUNITY)
Admission: RE | Admit: 2016-03-12 | Discharge: 2016-03-12 | Disposition: A | Discharge status: Home / Self Care | Payer: Medicare Other | Source: Ambulatory Visit | Attending: Surgery | Admitting: Surgery

## 2016-03-12 ENCOUNTER — Encounter: Payer: Self-pay | Admitting: Surgery

## 2016-03-12 ENCOUNTER — Ambulatory Visit (INDEPENDENT_AMBULATORY_CARE_PROVIDER_SITE_OTHER)
Admission: RE | Admit: 2016-03-12 | Discharge: 2016-03-12 | Disposition: A | Discharge status: Home / Self Care | Payer: Medicare Other | Source: Ambulatory Visit | Attending: Surgery | Admitting: Surgery

## 2016-03-12 ENCOUNTER — Encounter (HOSPITAL_COMMUNITY): Payer: Self-pay | Admitting: *Deleted

## 2016-03-12 ENCOUNTER — Ambulatory Visit (INDEPENDENT_AMBULATORY_CARE_PROVIDER_SITE_OTHER): Payer: Medicare Other | Admitting: Surgery

## 2016-03-12 ENCOUNTER — Other Ambulatory Visit: Payer: Self-pay

## 2016-03-12 VITALS — BP 120/86 | HR 69 | Temp 98.4°F | Resp 16 | Ht 65.0 in | Wt 271.0 lb

## 2016-03-12 DIAGNOSIS — Z01810 Encounter for preprocedural cardiovascular examination: Secondary | ICD-10-CM | POA: Diagnosis not present

## 2016-03-12 DIAGNOSIS — Z992 Dependence on renal dialysis: Secondary | ICD-10-CM | POA: Diagnosis not present

## 2016-03-12 DIAGNOSIS — N186 End stage renal disease: Secondary | ICD-10-CM | POA: Diagnosis not present

## 2016-03-12 DIAGNOSIS — E119 Type 2 diabetes mellitus without complications: Secondary | ICD-10-CM | POA: Insufficient documentation

## 2016-03-12 DIAGNOSIS — I110 Hypertensive heart disease with heart failure: Secondary | ICD-10-CM | POA: Insufficient documentation

## 2016-03-12 DIAGNOSIS — F419 Anxiety disorder, unspecified: Secondary | ICD-10-CM | POA: Insufficient documentation

## 2016-03-12 DIAGNOSIS — I509 Heart failure, unspecified: Secondary | ICD-10-CM | POA: Diagnosis not present

## 2016-03-12 DIAGNOSIS — E7800 Pure hypercholesterolemia, unspecified: Secondary | ICD-10-CM | POA: Diagnosis not present

## 2016-03-12 NOTE — Progress Notes (Signed)
Mrs Leitz denies any chest pain, she states that she sometimes has an iregular heart beat when she is on dialysis and she be comes short of breath, the subsides when tech slows the rate down.   Patient reports that CBG have been running 96 -103.  I instructed patient to take 48 units of 70/30 Sunday evening and no Insulin the morning of surgery.  I have attempted to contact this patient by phone with the following results:   I instructed patient to check CBG to check CBG and if it is less than 70 to treat it with Glucose Gel, Glucose tablets or 1/2 cup of clear juice like apple juice or cranberry juice, or 1/2 cup of regular soda. (not cream soda). I instructed patient to recheck CBG in 15 minutes and if CBG is not greater than 70, to  Call [phone] (pre- op). If it is before pre-op opens to retreat as before and recheck CBG in 15 minutes. I told patient to make note of time that liquid is taken and amount, that surgical time may have to be adjusted.   Mrs Korol had an Respiratory Arrest 11/28/15 after receiving sedition for a hemodialysis catheter, this happened in Interventional Radiology.  Patient received Naran and Romazicon and came around.  I printed this event for the chart and notified Dr Conrad [place]- he had no orders.

## 2016-03-12 NOTE — Progress Notes (Signed)
   Patient name: [name] MRN: [id number] DOB: 09/28/1966 Sex: female   Referred by: Dr. Befekadu  Reason for referral:  Chief Complaint  Patient presents with  . New Evaluation    Eval for perm access    HISTORY OF PRESENT ILLNESS: This is a 50-year-old female who presents today for permanent dialysis access.  She is right-handed.  She started dialysis in December 2017.  She states that she had a catheter placed for which she had a cardiac arrest and required a prolonged hospital stay.  She has recovered from that and is now in need of a more permanent access.  She states that she has lost approximately 170 pounds since that time, most of this has been fluid, however she has improved her diet and is trying to improve her activity level.  She is wheelchair bound since her hospitalization but has been working with therapy and canal stand with a walker and a cane.  The patient suffers from diabetes.  She states her blood sugars have been well controlled in the 90-95 range.  She has been bed bound because of lower extremity cellulitis which has led to sepsis and was associated with her decline at the beginning of the year.  She is on home oxygen continuously at 2 L secondary to COPD.  She is a nonsmoker and has never smoked.  She is on multiple medications for hypertension.  Past Medical History  Diagnosis Date  . Diabetes mellitus   . Chronic pain   . Lymph edema   . Cellulitis   . Chiari malformation     s/p surgery  . S/P colonoscopy 05/26/2007    Dr. Rehman-> sigmoid diverticulosis random biopsies benign  . CAD (coronary artery disease)   . Hypercholesterolemia   . Hypertension   . COPD (chronic obstructive pulmonary disease) ([facility])   . S/P endoscopy 05/01/2009    Dr. Ganem-> pill-induced esophageal ulcerations distal to midesophagus, 2 small ulcers in the antrum of the stomach  . Fibromyalgia   . On home O2   . Obesity hypoventilation syndrome ([facility])   . Hx of echocardiogram  10/2011    EF 55-60%  . Renal disorder     Pt started dialysis in Dec.    Past Surgical History  Procedure Laterality Date  . Cesarean section       x 2  . Abdominal hysterectomy    . Tonsillectomy    . Adenoidectomy    . Thyroidectomy, partial    . Craniectomy suboccipital w/ cervical laminectomy / chiari    . Portacath placement  07/05/2012    Procedure: INSERTION PORT-A-CATH;  Surgeon: Brent C Ziegler, MD;  Location: [facility];  Service: General;  Laterality: Left;  subclavian    Social History   Social History  . Marital Status: Married    Spouse Name: N/A  . Number of Children: 3  . Years of Education: N/A   Occupational History  . Not on file.   Social History Main Topics  . Smoking status: Never Smoker   . Smokeless tobacco: Never Used  . Alcohol Use: No  . Drug Use: No  . Sexual Activity: No   Other Topics Concern  . 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 Colon cancer Mother 50  . Stroke Mother 52  . Coronary artery disease Mother     Allergies   as of 03/12/2016 - Review Complete 03/12/2016  Allergen Reaction Noted  . Contrast media [iodinated diagnostic agents] Anaphylaxis, Hives, and Swelling 11/04/2011  . Pineapple Anaphylaxis, Itching, and Swelling 02/05/2012  . Vancomycin Nausea And Vomiting 03/13/2012  . Pneumococcal vaccines Swelling 11/04/2011  . Shellfish allergy Itching and Swelling 02/05/2012    Current Outpatient Prescriptions on File Prior to Visit  Medication Sig Dispense Refill  . acetaminophen (TYLENOL) 325 MG tablet Take 650 mg by mouth every 6 (six) hours as needed. fever    . albuterol (PROVENTIL) (2.5 MG/3ML) 0.083% nebulizer solution Take 2.5 mg by nebulization every 6 (six) hours as needed for wheezing or shortness of breath.    . ALPRAZolam (XANAX) 0.5 MG tablet Take 0.5 mg by mouth 3 (three) times daily as needed for anxiety or sleep.     .  amLODipine (NORVASC) 10 MG tablet Take 10 mg by mouth daily.    . diphenhydrAMINE (BENADRYL) 25 mg capsule Take 25 mg by mouth every 6 (six) hours as needed for allergies.    . metoprolol (LOPRESSOR) 50 MG tablet Take 0.5 tablets (25 mg total) by mouth 2 (two) times daily. 30 tablet 12  . Oxycodone HCl 10 MG TABS Take 10 mg by mouth 4 (four) times daily as needed (pain).     . amoxicillin (AMOXIL) 500 MG tablet Take 1 tablet (500 mg total) by mouth 2 (two) times daily. (Patient not taking: Reported on 02/18/2016) 30 tablet 1  . cephALEXin (KEFLEX) 500 MG capsule Take 1 capsule (500 mg total) by mouth 4 (four) times daily. (Patient not taking: Reported on 03/12/2016) 20 capsule 0  . insulin NPH-insulin regular (NOVOLIN 70/30) (70-30) 100 UNIT/ML injection Inject 60 Units into the skin 2 (two) times daily. Reported on 03/12/2016    . Multiple Vitamins-Minerals (MULTIVITAMIN WITH MINERALS) tablet Take 1 tablet by mouth daily. Reported on 03/12/2016    . [DISCONTINUED] insulin lispro (HUMALOG) 100 UNIT/ML injection Inject 60 Units into the skin 2 (two) times daily.       No current facility-administered medications on file prior to visit.     REVIEW OF SYSTEMS: Cardiovascular: No chest pain, chest pressure,  Positive irregular heartbeat.  Positive phlebitis.  Positive for leg swelling Pulmonary: Positive for continuous home oxygen, shortness of breath with exertion Neurologic: No weakness, paresthesias, aphasia, or amaurosis. No dizziness. Hematologic: No bleeding problems or clotting disorders. Musculoskeletal: No joint pain or joint swelling. Gastrointestinal: No blood in stool or hematemesis Genitourinary: No dysuria or hematuria. Psychiatric:: No history of major depression. Integumentary: No rashes or ulcers. Constitutional: No fever or chills.  PHYSICAL EXAMINATION:  Filed Vitals:   03/12/16 1502  BP: 120/86  Pulse: 69  Temp: 98.4 F (36.9 C)  Resp: 16  Height: 5' 5" (1.651 m)    Weight: 271 lb (122.925 kg)  SpO2: 97%   Body mass index is 45.1 kg/(m^2). General: The patient appears their stated age.   HEENT:  No gross abnormalities Pulmonary: Respirations are non-labored Musculoskeletal: There are no major deformities.   Neurologic: No focal weakness or paresthesias are detected, Skin: There are no ulcer or rashes noted. Psychiatric: The patient has normal affect. Cardiovascular: There is a regular rate and rhythm without significant murmur appreciated.Significant lower extremity edema.  Palpable radial and brachial pulse bilaterally.  Diagnostic Studies: I have reviewed her vascular lab studies.  She appears to have an adequate left cephalic vein.  Diameter measurements are between 0.32 and 0.56 beginning at the wrist.  Arterial   waveforms are triphasic bilaterally     Assessment:  End-stage renal disease Plan: I discussed proceeding with a left radiocephalic fistula.  If her vein is inadequate she understands she may require a brachiocephalic fistula.  I discussed the risks and benefits of the procedure which include but are not limited to the need for future interventions, the risk of non-maturity, and the risk of steal syndrome.  All of her questions were answered.  She wants to get this procedure expedited as she has been using a catheter for several months.  She has on dialysis Tuesday Thursday Saturday and so I will try to arrange for her operation at the earliest availability on a Monday Wednesday Friday.  She understands this may be with one of my partners if I'm not available.  We will try to do this using local anesthesia with mild IV sedation given her underlying medical issues     V. Wells Brabham IV, M.D. [facility] Office: [phone] Pager:  [phone]   

## 2016-03-13 DIAGNOSIS — D509 Iron deficiency anemia, unspecified: Secondary | ICD-10-CM | POA: Diagnosis not present

## 2016-03-13 DIAGNOSIS — Z23 Encounter for immunization: Secondary | ICD-10-CM | POA: Diagnosis not present

## 2016-03-13 DIAGNOSIS — Z992 Dependence on renal dialysis: Secondary | ICD-10-CM | POA: Diagnosis not present

## 2016-03-13 DIAGNOSIS — N186 End stage renal disease: Secondary | ICD-10-CM | POA: Diagnosis not present

## 2016-03-13 DIAGNOSIS — R110 Nausea: Secondary | ICD-10-CM | POA: Diagnosis not present

## 2016-03-13 DIAGNOSIS — D631 Anemia in chronic kidney disease: Secondary | ICD-10-CM | POA: Diagnosis not present

## 2016-03-14 DIAGNOSIS — I132 Hypertensive heart and chronic kidney disease with heart failure and with stage 5 chronic kidney disease, or end stage renal disease: Secondary | ICD-10-CM | POA: Diagnosis not present

## 2016-03-14 DIAGNOSIS — N186 End stage renal disease: Secondary | ICD-10-CM | POA: Diagnosis not present

## 2016-03-14 DIAGNOSIS — L03115 Cellulitis of right lower limb: Secondary | ICD-10-CM | POA: Diagnosis not present

## 2016-03-14 DIAGNOSIS — I890 Lymphedema, not elsewhere classified: Secondary | ICD-10-CM | POA: Diagnosis not present

## 2016-03-14 DIAGNOSIS — E1122 Type 2 diabetes mellitus with diabetic chronic kidney disease: Secondary | ICD-10-CM | POA: Diagnosis not present

## 2016-03-14 DIAGNOSIS — I5032 Chronic diastolic (congestive) heart failure: Secondary | ICD-10-CM | POA: Diagnosis not present

## 2016-03-14 MED ORDER — DEXTROSE 5 % IV SOLN
1.5000 g | INTRAVENOUS | Status: AC
  Administered 2016-03-15: 1.5 g via INTRAVENOUS
  Filled 2016-03-14: qty 1.5

## 2016-03-15 ENCOUNTER — Other Ambulatory Visit: Payer: Self-pay | Admitting: *Deleted

## 2016-03-15 ENCOUNTER — Encounter (HOSPITAL_COMMUNITY)
Admission: RE | Disposition: A | Discharge status: Home / Self Care | Payer: Self-pay | Source: Ambulatory Visit | Attending: Vascular Surgery

## 2016-03-15 ENCOUNTER — Ambulatory Visit (HOSPITAL_COMMUNITY): Payer: Medicare Other | Admitting: Certified Registered Nurse Anesthetist

## 2016-03-15 ENCOUNTER — Encounter (HOSPITAL_COMMUNITY): Payer: Self-pay | Admitting: Surgery

## 2016-03-15 ENCOUNTER — Ambulatory Visit (HOSPITAL_COMMUNITY)
Admission: RE | Admit: 2016-03-15 | Discharge: 2016-03-15 | Disposition: A | Discharge status: Home / Self Care | Payer: Medicare Other | Source: Ambulatory Visit | Attending: Vascular Surgery | Admitting: Vascular Surgery

## 2016-03-15 DIAGNOSIS — J449 Chronic obstructive pulmonary disease, unspecified: Secondary | ICD-10-CM | POA: Diagnosis not present

## 2016-03-15 DIAGNOSIS — Z794 Long term (current) use of insulin: Secondary | ICD-10-CM | POA: Diagnosis not present

## 2016-03-15 DIAGNOSIS — N186 End stage renal disease: Secondary | ICD-10-CM | POA: Insufficient documentation

## 2016-03-15 DIAGNOSIS — Z992 Dependence on renal dialysis: Secondary | ICD-10-CM | POA: Diagnosis not present

## 2016-03-15 DIAGNOSIS — Z993 Dependence on wheelchair: Secondary | ICD-10-CM | POA: Diagnosis not present

## 2016-03-15 DIAGNOSIS — Z91041 Radiographic dye allergy status: Secondary | ICD-10-CM | POA: Diagnosis not present

## 2016-03-15 DIAGNOSIS — I120 Hypertensive chronic kidney disease with stage 5 chronic kidney disease or end stage renal disease: Secondary | ICD-10-CM | POA: Diagnosis not present

## 2016-03-15 DIAGNOSIS — N185 Chronic kidney disease, stage 5: Secondary | ICD-10-CM | POA: Diagnosis not present

## 2016-03-15 DIAGNOSIS — I2510 Atherosclerotic heart disease of native coronary artery without angina pectoris: Secondary | ICD-10-CM | POA: Insufficient documentation

## 2016-03-15 DIAGNOSIS — Z9981 Dependence on supplemental oxygen: Secondary | ICD-10-CM | POA: Diagnosis not present

## 2016-03-15 DIAGNOSIS — E1122 Type 2 diabetes mellitus with diabetic chronic kidney disease: Secondary | ICD-10-CM | POA: Insufficient documentation

## 2016-03-15 DIAGNOSIS — Z4931 Encounter for adequacy testing for hemodialysis: Secondary | ICD-10-CM

## 2016-03-15 DIAGNOSIS — G8929 Other chronic pain: Secondary | ICD-10-CM | POA: Diagnosis not present

## 2016-03-15 DIAGNOSIS — I509 Heart failure, unspecified: Secondary | ICD-10-CM | POA: Diagnosis not present

## 2016-03-15 HISTORY — DX: Personal history of other medical treatment: Z92.89

## 2016-03-15 HISTORY — DX: Anxiety disorder, unspecified: F41.9

## 2016-03-15 HISTORY — DX: Sleep apnea, unspecified: G47.30

## 2016-03-15 HISTORY — DX: Adverse effect of unspecified anesthetic, initial encounter: T41.45XA

## 2016-03-15 HISTORY — PX: AV FISTULA PLACEMENT: SHX1204

## 2016-03-15 HISTORY — DX: Heart failure, unspecified: I50.9

## 2016-03-15 HISTORY — DX: Reserved for inherently not codable concepts without codable children: IMO0001

## 2016-03-15 LAB — POCT I-STAT 4, (NA,K, GLUC, HGB,HCT)
GLUCOSE: 148 mg/dL — AB (ref 65–99)
HCT: 46 % (ref 36.0–46.0)
Hemoglobin: 15.6 g/dL — ABNORMAL HIGH (ref 12.0–15.0)
Potassium: 3.4 mmol/L — ABNORMAL LOW (ref 3.5–5.1)
Sodium: 136 mmol/L (ref 135–145)

## 2016-03-15 LAB — GLUCOSE, CAPILLARY
GLUCOSE-CAPILLARY: 130 mg/dL — AB (ref 65–99)
Glucose-Capillary: 134 mg/dL — ABNORMAL HIGH (ref 65–99)

## 2016-03-15 SURGERY — ARTERIOVENOUS (AV) FISTULA CREATION
Anesthesia: Monitor Anesthesia Care | Site: Arm Lower | Laterality: Left

## 2016-03-15 MED ORDER — MIDAZOLAM HCL 2 MG/2ML IJ SOLN
INTRAMUSCULAR | Status: AC
  Filled 2016-03-15: qty 2

## 2016-03-15 MED ORDER — FENTANYL CITRATE (PF) 100 MCG/2ML IJ SOLN: 25.0000 ug | INTRAMUSCULAR | Status: DC | PRN

## 2016-03-15 MED ORDER — SODIUM CHLORIDE 0.9 % IV SOLN
INTRAVENOUS | Status: DC | PRN
  Administered 2016-03-15: 500 mL

## 2016-03-15 MED ORDER — ONDANSETRON HCL 4 MG/2ML IJ SOLN
INTRAMUSCULAR | Status: AC
  Filled 2016-03-15: qty 4

## 2016-03-15 MED ORDER — STERILE WATER FOR INJECTION IJ SOLN
INTRAMUSCULAR | Status: AC
  Filled 2016-03-15: qty 10

## 2016-03-15 MED ORDER — LIDOCAINE-EPINEPHRINE 0.5 %-1:200000 IJ SOLN
INTRAMUSCULAR | Status: DC | PRN
  Administered 2016-03-15: 15 mL

## 2016-03-15 MED ORDER — LIDOCAINE HCL (CARDIAC) 20 MG/ML IV SOLN
INTRAVENOUS | Status: DC | PRN
  Administered 2016-03-15: 40 mg via INTRATRACHEAL

## 2016-03-15 MED ORDER — SUCCINYLCHOLINE CHLORIDE 20 MG/ML IJ SOLN
INTRAMUSCULAR | Status: AC
  Filled 2016-03-15: qty 2

## 2016-03-15 MED ORDER — PHENYLEPHRINE HCL 10 MG/ML IJ SOLN
10.0000 mg | INTRAVENOUS | Status: DC | PRN
  Administered 2016-03-15: 25 ug/min via INTRAVENOUS

## 2016-03-15 MED ORDER — OXYCODONE HCL 10 MG PO TABS: 10.0000 mg | ORAL_TABLET | Freq: Three times a day (TID) | ORAL | Status: DC | PRN

## 2016-03-15 MED ORDER — MIDAZOLAM HCL 5 MG/5ML IJ SOLN
INTRAMUSCULAR | Status: DC | PRN
  Administered 2016-03-15 (×2): 1 mg via INTRAVENOUS

## 2016-03-15 MED ORDER — 0.9 % SODIUM CHLORIDE (POUR BTL) OPTIME
TOPICAL | Status: DC | PRN
  Administered 2016-03-15: 1000 mL

## 2016-03-15 MED ORDER — SODIUM CHLORIDE 0.9 % IV SOLN
INTRAVENOUS | Status: DC
  Administered 2016-03-15 (×3): via INTRAVENOUS

## 2016-03-15 MED ORDER — FENTANYL CITRATE (PF) 100 MCG/2ML IJ SOLN
INTRAMUSCULAR | Status: DC | PRN
  Administered 2016-03-15 (×3): 25 ug via INTRAVENOUS

## 2016-03-15 MED ORDER — EPHEDRINE SULFATE 50 MG/ML IJ SOLN
INTRAMUSCULAR | Status: AC
  Filled 2016-03-15: qty 1

## 2016-03-15 MED ORDER — EPHEDRINE SULFATE 50 MG/ML IJ SOLN
INTRAMUSCULAR | Status: DC | PRN
  Administered 2016-03-15: 5 mg via INTRAVENOUS

## 2016-03-15 MED ORDER — CHLORHEXIDINE GLUCONATE CLOTH 2 % EX PADS: 6.0000 | MEDICATED_PAD | Freq: Once | CUTANEOUS | Status: DC

## 2016-03-15 MED ORDER — FENTANYL CITRATE (PF) 250 MCG/5ML IJ SOLN
INTRAMUSCULAR | Status: AC
  Filled 2016-03-15: qty 5

## 2016-03-15 MED ORDER — LIDOCAINE HCL (CARDIAC) 20 MG/ML IV SOLN
INTRAVENOUS | Status: AC
  Filled 2016-03-15: qty 10

## 2016-03-15 MED ORDER — PHENYLEPHRINE HCL 10 MG/ML IJ SOLN
INTRAMUSCULAR | Status: DC | PRN
  Administered 2016-03-15 (×2): 80 ug via INTRAVENOUS

## 2016-03-15 MED ORDER — DEXMEDETOMIDINE HCL IN NACL 200 MCG/50ML IV SOLN
INTRAVENOUS | Status: AC
  Filled 2016-03-15: qty 50

## 2016-03-15 MED ORDER — PROPOFOL 500 MG/50ML IV EMUL
INTRAVENOUS | Status: DC | PRN
  Administered 2016-03-15: 75 ug/kg/min via INTRAVENOUS

## 2016-03-15 MED ORDER — PROMETHAZINE HCL 25 MG/ML IJ SOLN: 6.2500 mg | INTRAMUSCULAR | Status: DC | PRN

## 2016-03-15 MED ORDER — LIDOCAINE-EPINEPHRINE 0.5 %-1:200000 IJ SOLN
INTRAMUSCULAR | Status: AC
  Filled 2016-03-15: qty 1

## 2016-03-15 SURGICAL SUPPLY — 37 items
ARMBAND PINK RESTRICT EXTREMIT (MISCELLANEOUS) ×3 IMPLANT
BENZOIN TINCTURE PRP APPL 2/3 (GAUZE/BANDAGES/DRESSINGS) ×3 IMPLANT
CANISTER SUCTION 2500CC (MISCELLANEOUS) ×3 IMPLANT
CANNULA VESSEL 3MM 2 BLNT TIP (CANNULA) IMPLANT
CLIP LIGATING EXTRA MED SLVR (CLIP) ×3 IMPLANT
CLIP LIGATING EXTRA SM BLUE (MISCELLANEOUS) ×3 IMPLANT
CLOSURE STERI-STRIP 1/2X4 (GAUZE/BANDAGES/DRESSINGS) ×1
CLOSURE WOUND 1/2 X4 (GAUZE/BANDAGES/DRESSINGS) ×1
CLSR STERI-STRIP ANTIMIC 1/2X4 (GAUZE/BANDAGES/DRESSINGS) ×2 IMPLANT
COVER PROBE W GEL 5X96 (DRAPES) ×3 IMPLANT
DECANTER SPIKE VIAL GLASS SM (MISCELLANEOUS) ×3 IMPLANT
ELECT REM PT RETURN 9FT ADLT (ELECTROSURGICAL) ×3
ELECTRODE REM PT RTRN 9FT ADLT (ELECTROSURGICAL) ×1 IMPLANT
GAUZE SPONGE 4X4 12PLY STRL (GAUZE/BANDAGES/DRESSINGS) ×3 IMPLANT
GEL ULTRASOUND 20GR AQUASONIC (MISCELLANEOUS) ×3 IMPLANT
GLOVE BIOGEL PI IND STRL 6.5 (GLOVE) ×4 IMPLANT
GLOVE BIOGEL PI IND STRL 7.5 (GLOVE) ×1 IMPLANT
GLOVE BIOGEL PI INDICATOR 6.5 (GLOVE) ×8
GLOVE BIOGEL PI INDICATOR 7.5 (GLOVE) ×2
GLOVE ECLIPSE 6.5 STRL STRAW (GLOVE) ×6 IMPLANT
GLOVE ECLIPSE 7.0 STRL STRAW (GLOVE) ×3 IMPLANT
GLOVE SS BIOGEL STRL SZ 7.5 (GLOVE) ×1 IMPLANT
GLOVE SUPERSENSE BIOGEL SZ 7.5 (GLOVE) ×2
GOWN STRL REUS W/ TWL LRG LVL3 (GOWN DISPOSABLE) ×4 IMPLANT
GOWN STRL REUS W/TWL LRG LVL3 (GOWN DISPOSABLE) ×8
KIT BASIN OR (CUSTOM PROCEDURE TRAY) ×3 IMPLANT
KIT ROOM TURNOVER OR (KITS) ×3 IMPLANT
NS IRRIG 1000ML POUR BTL (IV SOLUTION) ×3 IMPLANT
PACK CV ACCESS (CUSTOM PROCEDURE TRAY) ×3 IMPLANT
PAD ARMBOARD 7.5X6 YLW CONV (MISCELLANEOUS) ×6 IMPLANT
STRIP CLOSURE SKIN 1/2X4 (GAUZE/BANDAGES/DRESSINGS) ×2 IMPLANT
SUT PROLENE 6 0 CC (SUTURE) ×3 IMPLANT
SUT VIC AB 3-0 SH 27 (SUTURE) ×2
SUT VIC AB 3-0 SH 27X BRD (SUTURE) ×1 IMPLANT
TAPE CLOTH SURG 4X10 WHT LF (GAUZE/BANDAGES/DRESSINGS) ×3 IMPLANT
UNDERPAD 30X30 INCONTINENT (UNDERPADS AND DIAPERS) ×3 IMPLANT
WATER STERILE IRR 1000ML POUR (IV SOLUTION) ×3 IMPLANT

## 2016-03-15 NOTE — Transfer of Care (Signed)
Immediate Anesthesia Transfer of Care Note  Patient: [name]  Procedure(s) Performed: Procedure(s): CREATION OF LEFT ARM ARTERIOVENOUS (AV) FISTULA   (Left)  Patient Location: PACU  Anesthesia Type:MAC  Level of Consciousness: awake, alert , oriented and patient cooperative  Airway & Oxygen Therapy: Patient Spontanous Breathing and Patient connected to nasal cannula oxygen  Post-op Assessment: Report given to RN and Post -op Vital signs reviewed and stable  Post vital signs: Reviewed and stable  Last Vitals:  Filed Vitals:   03/15/16 0759  BP: 128/73  Pulse: 87  Temp: 37.1 C  Resp: 18    Complications: No apparent anesthesia complications

## 2016-03-15 NOTE — Anesthesia Postprocedure Evaluation (Signed)
Anesthesia Post Note  Patient: [name]  Procedure(s) Performed: Procedure(s) (LRB): CREATION OF LEFT ARM ARTERIOVENOUS (AV) FISTULA   (Left)  Patient location during evaluation: PACU Anesthesia Type: MAC Level of consciousness: awake and alert Pain management: pain level controlled Vital Signs Assessment: post-procedure vital signs reviewed and stable Respiratory status: spontaneous breathing, nonlabored ventilation, respiratory function stable and patient connected to nasal cannula oxygen Cardiovascular status: blood pressure returned to baseline and stable Postop Assessment: no signs of nausea or vomiting Anesthetic complications: no    Last Vitals:  Filed Vitals:   03/15/16 0759 03/15/16 1212  BP: 128/73   Pulse: 87   Temp: 37.1 C 36.5 C  Resp: 18     Last Pain:  Filed Vitals:   03/15/16 1221  PainSc: 5                  Nayleen Janosik S

## 2016-03-15 NOTE — Op Note (Signed)
    OPERATIVE REPORT  DATE OF SURGERY: 03/15/2016  PATIENT: [name], 50 y.o. female MRN: [id number]  DOB: 07-Feb-1966  PRE-OPERATIVE DIAGNOSIS: End-stage renal disease  POST-OPERATIVE DIAGNOSIS:  Same  PROCEDURE: Left radiocephalic AV fistula  SURGEON:  Curt Jews, M.D.  PHYSICIAN ASSISTANT: Collins  ANESTHESIA:  Local with sedation  EBL: Minimal ml  Total I/O In: 300 [I.V.:250; IV Piggyback:50] Out: 10 [Blood:10]  BLOOD ADMINISTERED: None  DRAINS: None  SPECIMEN: None  COUNTS CORRECT:  YES  PLAN OF CARE: PACU   PATIENT DISPOSITION:  PACU - hemodynamically stable  PROCEDURE DETAILS: Patient was taken to the operating placed supine position with the area of the left arm was prepped and draped in usual sterile fashion. SonoSite ultrasound was used to visualize the cephalic vein which was of adequate caliber at the wrist and throughout the forearm and antecubital space. The vein ran to the dorsal side of the hand. Using local anesthesia incision was made over the cephalic vein at the wrist and carried down to isolate the vein. Tributary branches were ligated with 4-0 silk ties and divided. The vein was mobilized further proximally and distally. The vein was ligated distally and divided and was gently dilated with heparinized saline was adequate for fistula creation. Separate incision was made over the radial pulse at the wrist and carried down to isolate the radial artery which was of good caliber. A tunnel was created between the vein mobilization incision and the radial artery and the vein was brought into approximation with the radial artery. Care was taken to prevent twisting and kinking of the vein. The artery was occluded proximally and distally with Serafin clamps and was opened with 11 blade incision longitudinally with Potts scissors. The vein was cut to appropriate length and was spatulated and sewn end-to-side to the artery with a running 6-0 Prolene suture.  Clamps were removed and good thrill was noted. The wound irrigated with saline. Hemostasis tablet cautery. The wounds were closed with 3-0 Vicryl in the subcutaneous and subcuticular tissue. Benzoin and Steri-Strips were applied   Curt Jews, M.D. 03/15/2016 2:15 PM

## 2016-03-15 NOTE — H&P (View-Only) (Signed)
Patient name: [name] MRN: [id number] DOB: 08/04/1966 Sex: female   Referred by: Dr. Lowanda Foster  Reason for referral:  Chief Complaint  Patient presents with  . New Evaluation    Eval for perm access    HISTORY OF PRESENT ILLNESS: This is a 50 year old female who presents today for permanent dialysis access.  She is right-handed.  She started dialysis in December 2017.  She states that she had a catheter placed for which she had a cardiac arrest and required a prolonged hospital stay.  She has recovered from that and is now in need of a more permanent access.  She states that she has lost approximately 170 pounds since that time, most of this has been fluid, however she has improved her diet and is trying to improve her activity level.  She is wheelchair bound since her hospitalization but has been working with therapy and canal stand with a walker and a cane.  The patient suffers from diabetes.  She states her blood sugars have been well controlled in the 90-95 range.  She has been bed bound because of lower extremity cellulitis which has led to sepsis and was associated with her decline at the beginning of the year.  She is on home oxygen continuously at 2 L secondary to COPD.  She is a nonsmoker and has never smoked.  She is on multiple medications for hypertension.  Past Medical History  Diagnosis Date  . Diabetes mellitus   . Chronic pain   . Lymph edema   . Cellulitis   . Chiari malformation     s/p surgery  . S/P colonoscopy 05/26/2007    Dr. Laural Golden sigmoid diverticulosis random biopsies benign  . CAD (coronary artery disease)   . Hypercholesterolemia   . Hypertension   . COPD (chronic obstructive pulmonary disease) ([place])   . S/P endoscopy 05/01/2009    Dr. Penelope Coop pill-induced esophageal ulcerations distal to midesophagus, 2 small ulcers in the antrum of the stomach  . Fibromyalgia   . On home O2   . Obesity hypoventilation syndrome ([place])   . Hx of echocardiogram  10/2011    EF 55-60%  . Renal disorder     Pt started dialysis in Dec.    Past Surgical History  Procedure Laterality Date  . Cesarean section       x 2  . Abdominal hysterectomy    . Tonsillectomy    . Adenoidectomy    . Thyroidectomy, partial    . Craniectomy suboccipital w/ cervical laminectomy / chiari    . Portacath placement  07/05/2012    Procedure: INSERTION PORT-A-CATH;  Surgeon: Donato Heinz, MD;  Location: [facility];  Service: General;  Laterality: Left;  subclavian    Social History   Social History  . Marital Status: Married    Spouse Name: N/A  . Number of Children: 3  . Years of Education: N/A   Occupational History  . Not on file.   Social History Main Topics  . Smoking status: Never Smoker   . Smokeless tobacco: Never Used  . Alcohol Use: No  . Drug Use: No  . Sexual Activity: No   Other Topics Concern  . 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 Colon cancer Mother 68  . Stroke Mother 19  . Coronary artery disease Mother     Allergies  as of 03/12/2016 - Review Complete 03/12/2016  Allergen Reaction Noted  . Contrast media [iodinated diagnostic agents] Anaphylaxis, Hives, and Swelling 11/04/2011  . Pineapple Anaphylaxis, Itching, and Swelling 02/05/2012  . Vancomycin Nausea And Vomiting 03/13/2012  . Pneumococcal vaccines Swelling 11/04/2011  . Shellfish allergy Itching and Swelling 02/05/2012    Current Outpatient Prescriptions on File Prior to Visit  Medication Sig Dispense Refill  . acetaminophen (TYLENOL) 325 MG tablet Take 650 mg by mouth every 6 (six) hours as needed. fever    . albuterol (PROVENTIL) (2.5 MG/3ML) 0.083% nebulizer solution Take 2.5 mg by nebulization every 6 (six) hours as needed for wheezing or shortness of breath.    . ALPRAZolam (XANAX) 0.5 MG tablet Take 0.5 mg by mouth 3 (three) times daily as needed for anxiety or sleep.     Marland Kitchen  amLODipine (NORVASC) 10 MG tablet Take 10 mg by mouth daily.    . diphenhydrAMINE (BENADRYL) 25 mg capsule Take 25 mg by mouth every 6 (six) hours as needed for allergies.    . metoprolol (LOPRESSOR) 50 MG tablet Take 0.5 tablets (25 mg total) by mouth 2 (two) times daily. 30 tablet 12  . Oxycodone HCl 10 MG TABS Take 10 mg by mouth 4 (four) times daily as needed (pain).     Marland Kitchen amoxicillin (AMOXIL) 500 MG tablet Take 1 tablet (500 mg total) by mouth 2 (two) times daily. (Patient not taking: Reported on 02/18/2016) 30 tablet 1  . cephALEXin (KEFLEX) 500 MG capsule Take 1 capsule (500 mg total) by mouth 4 (four) times daily. (Patient not taking: Reported on 03/12/2016) 20 capsule 0  . insulin NPH-insulin regular (NOVOLIN 70/30) (70-30) 100 UNIT/ML injection Inject 60 Units into the skin 2 (two) times daily. Reported on 03/12/2016    . Multiple Vitamins-Minerals (MULTIVITAMIN WITH MINERALS) tablet Take 1 tablet by mouth daily. Reported on 03/12/2016    . [DISCONTINUED] insulin lispro (HUMALOG) 100 UNIT/ML injection Inject 60 Units into the skin 2 (two) times daily.       No current facility-administered medications on file prior to visit.     REVIEW OF SYSTEMS: Cardiovascular: No chest pain, chest pressure,  Positive irregular heartbeat.  Positive phlebitis.  Positive for leg swelling Pulmonary: Positive for continuous home oxygen, shortness of breath with exertion Neurologic: No weakness, paresthesias, aphasia, or amaurosis. No dizziness. Hematologic: No bleeding problems or clotting disorders. Musculoskeletal: No joint pain or joint swelling. Gastrointestinal: No blood in stool or hematemesis Genitourinary: No dysuria or hematuria. Psychiatric:: No history of major depression. Integumentary: No rashes or ulcers. Constitutional: No fever or chills.  PHYSICAL EXAMINATION:  Filed Vitals:   03/12/16 1502  BP: 120/86  Pulse: 69  Temp: 98.4 F (36.9 C)  Resp: 16  Height: _0  (1.651 m)    Weight: 271 lb (122.925 kg)  SpO2: 97%   Body mass index is 45.1 kg/(m^2). General: The patient appears their stated age.   HEENT:  No gross abnormalities Pulmonary: Respirations are non-labored Musculoskeletal: There are no major deformities.   Neurologic: No focal weakness or paresthesias are detected, Skin: There are no ulcer or rashes noted. Psychiatric: The patient has normal affect. Cardiovascular: There is a regular rate and rhythm without significant murmur appreciated.Significant lower extremity edema.  Palpable radial and brachial pulse bilaterally.  Diagnostic Studies: I have reviewed her vascular lab studies.  She appears to have an adequate left cephalic vein.  Diameter measurements are between 0.32 and 0.56 beginning at the wrist.  Arterial  waveforms are triphasic bilaterally     Assessment:  End-stage renal disease Plan: I discussed proceeding with a left radiocephalic fistula.  If her vein is inadequate she understands she may require a brachiocephalic fistula.  I discussed the risks and benefits of the procedure which include but are not limited to the need for future interventions, the risk of non-maturity, and the risk of steal syndrome.  All of her questions were answered.  She wants to get this procedure expedited as she has been using a catheter for several months.  She has on dialysis Tuesday Thursday Saturday and so I will try to arrange for her operation at the earliest availability on a Monday Wednesday Friday.  She understands this may be with one of my partners if I'm not available.  We will try to do this using local anesthesia with mild IV sedation given her underlying medical issues     V. Leia Alf, M.D. [facility] Office: [phone] Pager:  [phone]

## 2016-03-15 NOTE — Interval H&P Note (Signed)
History and Physical Interval Note:  03/15/2016 10:06 AM  [name]  has presented today for surgery, with the diagnosis of End Stage Renal Disease N18.6  The various methods of treatment have been discussed with the patient and family. After consideration of risks, benefits and other options for treatment, the patient has consented to  Procedure(s): ARTERIOVENOUS (AV) FISTULA CREATION (Left) as a surgical intervention .  The patient's history has been reviewed, patient examined, no change in status, stable for surgery.  I have reviewed the patient's chart and labs.  Questions were answered to the patient's satisfaction.     Curt Jews

## 2016-03-15 NOTE — Anesthesia Preprocedure Evaluation (Addendum)
Anesthesia Evaluation  Patient identified by MRN, date of birth, ID band Patient awake    Reviewed: Allergy & Precautions, NPO status , Patient's Chart, lab work & pertinent test results  Airway Mallampati: II  TM Distance: >3 FB Neck ROM: Full    Dental no notable dental hx.    Pulmonary neg pulmonary ROS, sleep apnea ,    Pulmonary exam normal breath sounds clear to auscultation       Cardiovascular hypertension, Pt. on medications +CHF  Normal cardiovascular exam Rhythm:Regular Rate:Normal     Neuro/Psych negative neurological ROS  negative psych ROS   GI/Hepatic negative GI ROS, Neg liver ROS,   Endo/Other  diabetesMorbid obesity  Renal/GU DialysisRenal disease  negative genitourinary   Musculoskeletal negative musculoskeletal ROS (+)   Abdominal   Peds negative pediatric ROS (+)  Hematology negative hematology ROS (+)   Anesthesia Other Findings   Reproductive/Obstetrics negative OB ROS                            Anesthesia Physical Anesthesia Plan  ASA: III  Anesthesia Plan: MAC   Post-op Pain Management:    Induction: Intravenous  Airway Management Planned: Simple Face Mask  Additional Equipment:   Intra-op Plan:   Post-operative Plan:   Informed Consent: I have reviewed the patients History and Physical, chart, labs and discussed the procedure including the risks, benefits and alternatives for the proposed anesthesia with the patient or authorized representative who has indicated his/her understanding and acceptance.   Dental advisory given  Plan Discussed with: CRNA and Surgeon  Anesthesia Plan Comments: (Patient prefers MAC)       Anesthesia Quick Evaluation

## 2016-03-16 ENCOUNTER — Encounter (HOSPITAL_COMMUNITY): Payer: Self-pay | Admitting: Vascular Surgery

## 2016-03-16 DIAGNOSIS — Z992 Dependence on renal dialysis: Secondary | ICD-10-CM | POA: Diagnosis not present

## 2016-03-16 DIAGNOSIS — D631 Anemia in chronic kidney disease: Secondary | ICD-10-CM | POA: Diagnosis not present

## 2016-03-16 DIAGNOSIS — D509 Iron deficiency anemia, unspecified: Secondary | ICD-10-CM | POA: Diagnosis not present

## 2016-03-16 DIAGNOSIS — Z23 Encounter for immunization: Secondary | ICD-10-CM | POA: Diagnosis not present

## 2016-03-16 DIAGNOSIS — R110 Nausea: Secondary | ICD-10-CM | POA: Diagnosis not present

## 2016-03-16 DIAGNOSIS — N186 End stage renal disease: Secondary | ICD-10-CM | POA: Diagnosis not present

## 2016-03-17 ENCOUNTER — Telehealth: Payer: Self-pay | Admitting: Vascular Surgery

## 2016-03-17 DIAGNOSIS — N186 End stage renal disease: Secondary | ICD-10-CM | POA: Diagnosis not present

## 2016-03-17 DIAGNOSIS — I132 Hypertensive heart and chronic kidney disease with heart failure and with stage 5 chronic kidney disease, or end stage renal disease: Secondary | ICD-10-CM | POA: Diagnosis not present

## 2016-03-17 DIAGNOSIS — I5032 Chronic diastolic (congestive) heart failure: Secondary | ICD-10-CM | POA: Diagnosis not present

## 2016-03-17 DIAGNOSIS — L03115 Cellulitis of right lower limb: Secondary | ICD-10-CM | POA: Diagnosis not present

## 2016-03-17 DIAGNOSIS — E1122 Type 2 diabetes mellitus with diabetic chronic kidney disease: Secondary | ICD-10-CM | POA: Diagnosis not present

## 2016-03-17 DIAGNOSIS — I890 Lymphedema, not elsewhere classified: Secondary | ICD-10-CM | POA: Diagnosis not present

## 2016-03-17 NOTE — Telephone Encounter (Signed)
-----  Message from Mena Goes, RN sent at 03/15/2016  4:35 PM EDT ----- Regarding: schedule   ----- Message -----    From: Ulyses Amor, PA-C    Sent: 03/15/2016  11:57 AM      To: [facility] Charge Pool  F/U in 6 weeks with  Dr. Donnetta Hutching s/p AV fistula left forearm duplex

## 2016-03-18 DIAGNOSIS — Z23 Encounter for immunization: Secondary | ICD-10-CM | POA: Diagnosis not present

## 2016-03-18 DIAGNOSIS — Z992 Dependence on renal dialysis: Secondary | ICD-10-CM | POA: Diagnosis not present

## 2016-03-18 DIAGNOSIS — D509 Iron deficiency anemia, unspecified: Secondary | ICD-10-CM | POA: Diagnosis not present

## 2016-03-18 DIAGNOSIS — D631 Anemia in chronic kidney disease: Secondary | ICD-10-CM | POA: Diagnosis not present

## 2016-03-18 DIAGNOSIS — R110 Nausea: Secondary | ICD-10-CM | POA: Diagnosis not present

## 2016-03-18 DIAGNOSIS — N186 End stage renal disease: Secondary | ICD-10-CM | POA: Diagnosis not present

## 2016-03-19 DIAGNOSIS — E1122 Type 2 diabetes mellitus with diabetic chronic kidney disease: Secondary | ICD-10-CM | POA: Diagnosis not present

## 2016-03-19 DIAGNOSIS — Z992 Dependence on renal dialysis: Secondary | ICD-10-CM | POA: Diagnosis not present

## 2016-03-19 DIAGNOSIS — L03115 Cellulitis of right lower limb: Secondary | ICD-10-CM | POA: Diagnosis not present

## 2016-03-19 DIAGNOSIS — N186 End stage renal disease: Secondary | ICD-10-CM | POA: Diagnosis not present

## 2016-03-19 DIAGNOSIS — I132 Hypertensive heart and chronic kidney disease with heart failure and with stage 5 chronic kidney disease, or end stage renal disease: Secondary | ICD-10-CM | POA: Diagnosis not present

## 2016-03-19 DIAGNOSIS — I890 Lymphedema, not elsewhere classified: Secondary | ICD-10-CM | POA: Diagnosis not present

## 2016-03-19 DIAGNOSIS — I5032 Chronic diastolic (congestive) heart failure: Secondary | ICD-10-CM | POA: Diagnosis not present

## 2016-03-20 DIAGNOSIS — Z992 Dependence on renal dialysis: Secondary | ICD-10-CM | POA: Diagnosis not present

## 2016-03-20 DIAGNOSIS — N186 End stage renal disease: Secondary | ICD-10-CM | POA: Diagnosis not present

## 2016-03-20 DIAGNOSIS — Z23 Encounter for immunization: Secondary | ICD-10-CM | POA: Diagnosis not present

## 2016-03-20 DIAGNOSIS — D509 Iron deficiency anemia, unspecified: Secondary | ICD-10-CM | POA: Diagnosis not present

## 2016-03-20 DIAGNOSIS — N2581 Secondary hyperparathyroidism of renal origin: Secondary | ICD-10-CM | POA: Diagnosis not present

## 2016-03-22 DIAGNOSIS — E1122 Type 2 diabetes mellitus with diabetic chronic kidney disease: Secondary | ICD-10-CM | POA: Diagnosis not present

## 2016-03-22 DIAGNOSIS — I890 Lymphedema, not elsewhere classified: Secondary | ICD-10-CM | POA: Diagnosis not present

## 2016-03-22 DIAGNOSIS — L03115 Cellulitis of right lower limb: Secondary | ICD-10-CM | POA: Diagnosis not present

## 2016-03-22 DIAGNOSIS — N186 End stage renal disease: Secondary | ICD-10-CM | POA: Diagnosis not present

## 2016-03-22 DIAGNOSIS — I5032 Chronic diastolic (congestive) heart failure: Secondary | ICD-10-CM | POA: Diagnosis not present

## 2016-03-22 DIAGNOSIS — I132 Hypertensive heart and chronic kidney disease with heart failure and with stage 5 chronic kidney disease, or end stage renal disease: Secondary | ICD-10-CM | POA: Diagnosis not present

## 2016-03-23 DIAGNOSIS — N186 End stage renal disease: Secondary | ICD-10-CM | POA: Diagnosis not present

## 2016-03-23 DIAGNOSIS — N2581 Secondary hyperparathyroidism of renal origin: Secondary | ICD-10-CM | POA: Diagnosis not present

## 2016-03-23 DIAGNOSIS — Z992 Dependence on renal dialysis: Secondary | ICD-10-CM | POA: Diagnosis not present

## 2016-03-23 DIAGNOSIS — D509 Iron deficiency anemia, unspecified: Secondary | ICD-10-CM | POA: Diagnosis not present

## 2016-03-23 DIAGNOSIS — Z23 Encounter for immunization: Secondary | ICD-10-CM | POA: Diagnosis not present

## 2016-03-24 DIAGNOSIS — N186 End stage renal disease: Secondary | ICD-10-CM | POA: Diagnosis not present

## 2016-03-24 DIAGNOSIS — I5032 Chronic diastolic (congestive) heart failure: Secondary | ICD-10-CM | POA: Diagnosis not present

## 2016-03-24 DIAGNOSIS — L03115 Cellulitis of right lower limb: Secondary | ICD-10-CM | POA: Diagnosis not present

## 2016-03-24 DIAGNOSIS — E1122 Type 2 diabetes mellitus with diabetic chronic kidney disease: Secondary | ICD-10-CM | POA: Diagnosis not present

## 2016-03-24 DIAGNOSIS — I890 Lymphedema, not elsewhere classified: Secondary | ICD-10-CM | POA: Diagnosis not present

## 2016-03-24 DIAGNOSIS — I132 Hypertensive heart and chronic kidney disease with heart failure and with stage 5 chronic kidney disease, or end stage renal disease: Secondary | ICD-10-CM | POA: Diagnosis not present

## 2016-03-25 DIAGNOSIS — Z992 Dependence on renal dialysis: Secondary | ICD-10-CM | POA: Diagnosis not present

## 2016-03-25 DIAGNOSIS — N2581 Secondary hyperparathyroidism of renal origin: Secondary | ICD-10-CM | POA: Diagnosis not present

## 2016-03-25 DIAGNOSIS — D509 Iron deficiency anemia, unspecified: Secondary | ICD-10-CM | POA: Diagnosis not present

## 2016-03-25 DIAGNOSIS — Z23 Encounter for immunization: Secondary | ICD-10-CM | POA: Diagnosis not present

## 2016-03-25 DIAGNOSIS — N186 End stage renal disease: Secondary | ICD-10-CM | POA: Diagnosis not present

## 2016-03-28 DIAGNOSIS — I890 Lymphedema, not elsewhere classified: Secondary | ICD-10-CM | POA: Diagnosis not present

## 2016-03-28 DIAGNOSIS — I132 Hypertensive heart and chronic kidney disease with heart failure and with stage 5 chronic kidney disease, or end stage renal disease: Secondary | ICD-10-CM | POA: Diagnosis not present

## 2016-03-28 DIAGNOSIS — N186 End stage renal disease: Secondary | ICD-10-CM | POA: Diagnosis not present

## 2016-03-28 DIAGNOSIS — E1122 Type 2 diabetes mellitus with diabetic chronic kidney disease: Secondary | ICD-10-CM | POA: Diagnosis not present

## 2016-03-28 DIAGNOSIS — L03115 Cellulitis of right lower limb: Secondary | ICD-10-CM | POA: Diagnosis not present

## 2016-03-28 DIAGNOSIS — I5032 Chronic diastolic (congestive) heart failure: Secondary | ICD-10-CM | POA: Diagnosis not present

## 2016-03-30 DIAGNOSIS — D509 Iron deficiency anemia, unspecified: Secondary | ICD-10-CM | POA: Diagnosis not present

## 2016-03-30 DIAGNOSIS — N2581 Secondary hyperparathyroidism of renal origin: Secondary | ICD-10-CM | POA: Diagnosis not present

## 2016-03-30 DIAGNOSIS — Z23 Encounter for immunization: Secondary | ICD-10-CM | POA: Diagnosis not present

## 2016-03-30 DIAGNOSIS — N186 End stage renal disease: Secondary | ICD-10-CM | POA: Diagnosis not present

## 2016-03-30 DIAGNOSIS — Z992 Dependence on renal dialysis: Secondary | ICD-10-CM | POA: Diagnosis not present

## 2016-03-31 DIAGNOSIS — L03115 Cellulitis of right lower limb: Secondary | ICD-10-CM | POA: Diagnosis not present

## 2016-03-31 DIAGNOSIS — N186 End stage renal disease: Secondary | ICD-10-CM | POA: Diagnosis not present

## 2016-03-31 DIAGNOSIS — I132 Hypertensive heart and chronic kidney disease with heart failure and with stage 5 chronic kidney disease, or end stage renal disease: Secondary | ICD-10-CM | POA: Diagnosis not present

## 2016-03-31 DIAGNOSIS — I890 Lymphedema, not elsewhere classified: Secondary | ICD-10-CM | POA: Diagnosis not present

## 2016-03-31 DIAGNOSIS — I5032 Chronic diastolic (congestive) heart failure: Secondary | ICD-10-CM | POA: Diagnosis not present

## 2016-03-31 DIAGNOSIS — E1122 Type 2 diabetes mellitus with diabetic chronic kidney disease: Secondary | ICD-10-CM | POA: Diagnosis not present

## 2016-04-01 DIAGNOSIS — D509 Iron deficiency anemia, unspecified: Secondary | ICD-10-CM | POA: Diagnosis not present

## 2016-04-01 DIAGNOSIS — Z992 Dependence on renal dialysis: Secondary | ICD-10-CM | POA: Diagnosis not present

## 2016-04-01 DIAGNOSIS — N2581 Secondary hyperparathyroidism of renal origin: Secondary | ICD-10-CM | POA: Diagnosis not present

## 2016-04-01 DIAGNOSIS — Z23 Encounter for immunization: Secondary | ICD-10-CM | POA: Diagnosis not present

## 2016-04-01 DIAGNOSIS — N186 End stage renal disease: Secondary | ICD-10-CM | POA: Diagnosis not present

## 2016-04-03 DIAGNOSIS — Z992 Dependence on renal dialysis: Secondary | ICD-10-CM | POA: Diagnosis not present

## 2016-04-03 DIAGNOSIS — N2581 Secondary hyperparathyroidism of renal origin: Secondary | ICD-10-CM | POA: Diagnosis not present

## 2016-04-03 DIAGNOSIS — D509 Iron deficiency anemia, unspecified: Secondary | ICD-10-CM | POA: Diagnosis not present

## 2016-04-03 DIAGNOSIS — N186 End stage renal disease: Secondary | ICD-10-CM | POA: Diagnosis not present

## 2016-04-03 DIAGNOSIS — Z23 Encounter for immunization: Secondary | ICD-10-CM | POA: Diagnosis not present

## 2016-04-06 DIAGNOSIS — D509 Iron deficiency anemia, unspecified: Secondary | ICD-10-CM | POA: Diagnosis not present

## 2016-04-06 DIAGNOSIS — Z23 Encounter for immunization: Secondary | ICD-10-CM | POA: Diagnosis not present

## 2016-04-06 DIAGNOSIS — N186 End stage renal disease: Secondary | ICD-10-CM | POA: Diagnosis not present

## 2016-04-06 DIAGNOSIS — N2581 Secondary hyperparathyroidism of renal origin: Secondary | ICD-10-CM | POA: Diagnosis not present

## 2016-04-06 DIAGNOSIS — Z992 Dependence on renal dialysis: Secondary | ICD-10-CM | POA: Diagnosis not present

## 2016-04-07 ENCOUNTER — Emergency Department (HOSPITAL_COMMUNITY): Payer: Medicare Other

## 2016-04-07 ENCOUNTER — Encounter (HOSPITAL_COMMUNITY): Payer: Self-pay

## 2016-04-07 ENCOUNTER — Emergency Department (HOSPITAL_COMMUNITY)
Admission: EM | Admit: 2016-04-07 | Discharge: 2016-04-07 | Disposition: A | Discharge status: Home / Self Care | Payer: Medicare Other | Attending: Emergency Medicine | Admitting: Emergency Medicine

## 2016-04-07 DIAGNOSIS — E119 Type 2 diabetes mellitus without complications: Secondary | ICD-10-CM | POA: Insufficient documentation

## 2016-04-07 DIAGNOSIS — R0600 Dyspnea, unspecified: Secondary | ICD-10-CM | POA: Diagnosis not present

## 2016-04-07 DIAGNOSIS — I890 Lymphedema, not elsewhere classified: Secondary | ICD-10-CM | POA: Diagnosis not present

## 2016-04-07 DIAGNOSIS — R609 Edema, unspecified: Secondary | ICD-10-CM | POA: Diagnosis not present

## 2016-04-07 DIAGNOSIS — R05 Cough: Secondary | ICD-10-CM | POA: Diagnosis not present

## 2016-04-07 DIAGNOSIS — I5032 Chronic diastolic (congestive) heart failure: Secondary | ICD-10-CM | POA: Diagnosis not present

## 2016-04-07 DIAGNOSIS — I132 Hypertensive heart and chronic kidney disease with heart failure and with stage 5 chronic kidney disease, or end stage renal disease: Secondary | ICD-10-CM | POA: Diagnosis not present

## 2016-04-07 DIAGNOSIS — N186 End stage renal disease: Secondary | ICD-10-CM | POA: Diagnosis not present

## 2016-04-07 DIAGNOSIS — I110 Hypertensive heart disease with heart failure: Secondary | ICD-10-CM | POA: Insufficient documentation

## 2016-04-07 DIAGNOSIS — I10 Essential (primary) hypertension: Secondary | ICD-10-CM | POA: Diagnosis not present

## 2016-04-07 DIAGNOSIS — I509 Heart failure, unspecified: Secondary | ICD-10-CM | POA: Diagnosis not present

## 2016-04-07 DIAGNOSIS — Z794 Long term (current) use of insulin: Secondary | ICD-10-CM | POA: Diagnosis not present

## 2016-04-07 DIAGNOSIS — R0682 Tachypnea, not elsewhere classified: Secondary | ICD-10-CM | POA: Diagnosis not present

## 2016-04-07 DIAGNOSIS — Z79899 Other long term (current) drug therapy: Secondary | ICD-10-CM | POA: Diagnosis not present

## 2016-04-07 DIAGNOSIS — L03115 Cellulitis of right lower limb: Secondary | ICD-10-CM | POA: Diagnosis not present

## 2016-04-07 DIAGNOSIS — E1122 Type 2 diabetes mellitus with diabetic chronic kidney disease: Secondary | ICD-10-CM | POA: Diagnosis not present

## 2016-04-07 DIAGNOSIS — R0602 Shortness of breath: Secondary | ICD-10-CM | POA: Diagnosis not present

## 2016-04-07 LAB — BRAIN NATRIURETIC PEPTIDE: B NATRIURETIC PEPTIDE 5: 56 pg/mL (ref 0.0–100.0)

## 2016-04-07 MED ORDER — ALPRAZOLAM 0.5 MG PO TABS
1.0000 mg | ORAL_TABLET | Freq: Once | ORAL | Status: AC
  Administered 2016-04-07: 1 mg via ORAL
  Filled 2016-04-07: qty 2

## 2016-04-07 MED ORDER — PREDNISONE 20 MG PO TABS: 40.0000 mg | ORAL_TABLET | Freq: Every day | ORAL | Status: DC

## 2016-04-07 MED ORDER — ALBUTEROL (5 MG/ML) CONTINUOUS INHALATION SOLN
10.0000 mg/h | INHALATION_SOLUTION | RESPIRATORY_TRACT | Status: DC
  Administered 2016-04-07: 10 mg/h via RESPIRATORY_TRACT
  Filled 2016-04-07: qty 20

## 2016-04-07 MED ORDER — PREDNISONE 50 MG PO TABS
60.0000 mg | ORAL_TABLET | Freq: Once | ORAL | Status: AC
  Administered 2016-04-07: 60 mg via ORAL
  Filled 2016-04-07: qty 1

## 2016-04-07 NOTE — ED Provider Notes (Signed)
CSN: [id number]     Arrival date & time 04/07/16  1611 History   First MD Initiated Contact with Patient 04/07/16 1618     Chief Complaint  Patient presents with  . Shortness of Breath     (Consider location/radiation/quality/duration/timing/severity/associated sxs/prior Treatment) HPI Patient presents with concern of dyspnea. Patient began feeling symptoms soon after completing a session of physical therapy earlier today, approximately 6 hours ago. Since that time the patient has had persistent dyspnea, though it is improved somewhat after 3 albuterol sessions. Patient was well prior to the onset of symptoms. Patient acknowledges a history of COPD, is on oxygen continuously. No recent other health issues, no fever, chest pain, belly pain, cough, nausea, vomiting. Now, patient claims of mild dyspnea.   Past Medical History  Diagnosis Date  . Chronic pain   . Lymph edema   . Cellulitis   . Chiari malformation     s/p surgery  . S/P colonoscopy 05/26/2007    Dr. Laural Golden sigmoid diverticulosis random biopsies benign  . Hypercholesterolemia   . Hypertension   . S/P endoscopy 05/01/2009    Dr. Penelope Coop pill-induced esophageal ulcerations distal to midesophagus, 2 small ulcers in the antrum of the stomach  . Fibromyalgia   . On home O2   . Obesity hypoventilation syndrome ([place])   . Hx of echocardiogram 10/2011    EF 55-60%  . Renal disorder     Pt started dialysis in Dec.  . Complication of anesthesia 11/28/15    Resp arrest after  conscious  sedation  . CHF (congestive heart failure) ([place])   . Shortness of breath dyspnea     with any exertion or if heart rate  is irregular while on dialysis  . Diabetes mellitus     Type II  . Anxiety   . History of blood transfusion     hemorrage duinrg pregancy  . Sleep apnea    Past Surgical History  Procedure Laterality Date  . Cesarean section       x 2  . Abdominal hysterectomy    . Tonsillectomy    . Adenoidectomy    .  Thyroidectomy, partial    . Craniectomy suboccipital w/ cervical laminectomy / chiari    . Portacath placement  07/05/2012    Procedure: INSERTION PORT-A-CATH;  Surgeon: Donato Heinz, MD;  Location: [facility];  Service: General;  Laterality: Left;  subclavian  . Av fistula placement Left 03/15/2016    Procedure: CREATION OF LEFT ARM ARTERIOVENOUS (AV) FISTULA  ;  Surgeon: Rosetta Posner, MD;  Location: [facility];  Service: Vascular;  Laterality: Left;   Family History  Problem Relation Age of Onset  . Colon cancer Mother 40  . Stroke Mother 36  . Coronary artery disease Mother    Social History  Substance Use Topics  . Smoking status: Never Smoker   . Smokeless tobacco: Never Used  . Alcohol Use: No   OB History    Gravida Para Term Preterm AB TAB SAB Ectopic Multiple Living   _0 Review of Systems  Constitutional:       Per HPI, otherwise negative  HENT:       Per HPI, otherwise negative  Respiratory:       Per HPI, otherwise negative  Cardiovascular:       Per HPI, otherwise negative  Gastrointestinal: Negative for vomiting.  Endocrine:  Negative aside from HPI  Genitourinary:       Neg aside from HPI   Musculoskeletal:       Per HPI, otherwise negative  Skin: Negative.   Neurological: Negative for syncope.      Allergies  Contrast media; Pineapple; Vancomycin; Pneumococcal vaccines; and Shellfish allergy  Home Medications   Prior to Admission medications   Medication Sig Start Date End Date Taking? Authorizing Provider  ALPRAZolam Duanne Moron) 0.5 MG tablet Take 0.5 mg by mouth 3 (three) times daily as needed for anxiety or sleep.    Yes Historical Provider, MD  amLODipine (NORVASC) 10 MG tablet Take 10 mg by mouth daily.   Yes Historical Provider, MD  b complex-vitamin c-folic acid (NEPHRO-VITE) 0.8 MG TABS tablet Take 1 tablet by mouth daily.   Yes Historical Provider, MD  insulin NPH-insulin regular (NOVOLIN 70/30) (70-30) 100 UNIT/ML injection  Inject 60 Units into the skin 2 (two) times daily. Reported on 03/12/2016   Yes Historical Provider, MD  metoprolol (LOPRESSOR) 50 MG tablet Take 0.5 tablets (25 mg total) by mouth 2 (two) times daily. 12/04/15  Yes [name], MD  acetaminophen (TYLENOL) 325 MG tablet Take 650 mg by mouth every 6 (six) hours as needed for fever.     Historical Provider, MD  albuterol (PROVENTIL) (2.5 MG/3ML) 0.083% nebulizer solution Take 2.5 mg by nebulization every 6 (six) hours as needed for wheezing or shortness of breath.    Historical Provider, MD  diphenhydrAMINE (BENADRYL) 25 mg capsule Take 25 mg by mouth every 6 (six) hours as needed for allergies.    Historical Provider, MD  ondansetron (ZOFRAN-ODT) 4 MG disintegrating tablet Take 4 mg by mouth every 8 (eight) hours as needed for nausea or vomiting.    Historical Provider, MD  Oxycodone HCl 10 MG TABS Take 1 tablet (10 mg total) by mouth 3 (three) times daily as needed (pain). 03/15/16   Ulyses Amor, PA-C   BP 135/58 mmHg  Pulse 94  Temp(Src) 97.8 F (36.6 C) (Oral)  Resp 17  Ht _0  (1.651 m)  Wt 262 lb (118.842 kg)  BMI 43.60 kg/m2  SpO2 99% Physical Exam  Constitutional: She is oriented to person, place, and time. She appears well-developed and well-nourished. No distress.  Morbidly obese female sitting upright in bed, nasal cannula in place, speaking clearly  HENT:  Head: Normocephalic and atraumatic.  Eyes: Conjunctivae and EOM are normal.  Cardiovascular: Normal rate and regular rhythm.   Pulmonary/Chest: No stridor. She has decreased breath sounds. She has no wheezes.  Abdominal: She exhibits no distension.  Musculoskeletal: She exhibits no edema.  Neurological: She is alert and oriented to person, place, and time. No cranial nerve deficit.  Skin: Skin is warm and dry.  Psychiatric: She has a normal mood and affect.  Nursing note and vitals reviewed.   ED Course  Procedures (including critical care time) Labs Review Labs  Reviewed  BRAIN NATRIURETIC PEPTIDE    Imaging Review Dg Chest 2 View  04/07/2016  CLINICAL DATA:  Nonproductive cough, chest pain and sore throat after physical therapy today. EXAM: CHEST  2 VIEW COMPARISON:  11/13/2015. FINDINGS: The dialysis catheter is an good position without complicating features. There is also a stable left-sided power port. The heart is mildly enlarged but stable. Stable moderate tortuosity of the thoracic aorta. Mild vascular congestion but no infiltrates or effusions. No overt pulmonary edema. The bony thorax is intact. IMPRESSION: Cardiac enlargement and vascular congestion without overt  pulmonary edema. Electronically Signed   By: Marijo Sanes M.D.   On: 04/07/2016 17:45   I have personally reviewed and evaluated these images and lab results as part of my medical decision-making.   EKG Interpretation   Date/Time:  Wednesday April 07 2016 16:18:37 EDT Ventricular Rate:  75 PR Interval:  146 QRS Duration: 100 QT Interval:  427 QTC Calculation: 477 R Axis:   92 Text Interpretation:  Sinus rhythm Consider left atrial enlargement  Probable lateral infarct, old Sinus rhythm Artifact Borderline ECG  Confirmed by Carmin Muskrat  MD ([id number]) on 04/07/2016 4:21:14 PM     Pulse oxygenation 100% with nasal cannula abnormal  7:13 PM Following several breathing treatments the patient is substantially improved, smiling, in no distress. We discussed the need for close monitoring.  Patient discharged in stable condition.  MDM  With multiple medical issues, chronic CHF, COPD presents with dyspnea. Here, no evidence for overt fluid overload. Patient improved after several breathing sessions, and with no x-ray evidence for pneumonia, the patient was discharged in stable condition with scheduled albuterol sessions, steroids.  Carmin Muskrat, MD 04/07/16 [phone]

## 2016-04-07 NOTE — ED Notes (Signed)
Pt states she had just finished PT at home when she became SOB. States she has COPD and is on oxygen at home. Pt states she administered one albuterol breathing treatment prior to EMS arriving and was given two albuterol breathing treatments by EMS

## 2016-04-07 NOTE — Discharge Instructions (Signed)
As discussed, today's evaluation has been largely reassuring. To minimize your breathing difficulty, please use albuterol every 4 hours for the next 2 days in addition to the prescribed steroids.  Return here for concerning changes in your condition.

## 2016-04-08 DIAGNOSIS — N186 End stage renal disease: Secondary | ICD-10-CM | POA: Diagnosis not present

## 2016-04-08 DIAGNOSIS — D509 Iron deficiency anemia, unspecified: Secondary | ICD-10-CM | POA: Diagnosis not present

## 2016-04-08 DIAGNOSIS — N2581 Secondary hyperparathyroidism of renal origin: Secondary | ICD-10-CM | POA: Diagnosis not present

## 2016-04-08 DIAGNOSIS — Z23 Encounter for immunization: Secondary | ICD-10-CM | POA: Diagnosis not present

## 2016-04-08 DIAGNOSIS — Z992 Dependence on renal dialysis: Secondary | ICD-10-CM | POA: Diagnosis not present

## 2016-04-09 DIAGNOSIS — I5032 Chronic diastolic (congestive) heart failure: Secondary | ICD-10-CM | POA: Diagnosis not present

## 2016-04-09 DIAGNOSIS — I890 Lymphedema, not elsewhere classified: Secondary | ICD-10-CM | POA: Diagnosis not present

## 2016-04-09 DIAGNOSIS — I132 Hypertensive heart and chronic kidney disease with heart failure and with stage 5 chronic kidney disease, or end stage renal disease: Secondary | ICD-10-CM | POA: Diagnosis not present

## 2016-04-09 DIAGNOSIS — N186 End stage renal disease: Secondary | ICD-10-CM | POA: Diagnosis not present

## 2016-04-09 DIAGNOSIS — L03115 Cellulitis of right lower limb: Secondary | ICD-10-CM | POA: Diagnosis not present

## 2016-04-09 DIAGNOSIS — E1122 Type 2 diabetes mellitus with diabetic chronic kidney disease: Secondary | ICD-10-CM | POA: Diagnosis not present

## 2016-04-10 DIAGNOSIS — N2581 Secondary hyperparathyroidism of renal origin: Secondary | ICD-10-CM | POA: Diagnosis not present

## 2016-04-10 DIAGNOSIS — Z992 Dependence on renal dialysis: Secondary | ICD-10-CM | POA: Diagnosis not present

## 2016-04-10 DIAGNOSIS — Z23 Encounter for immunization: Secondary | ICD-10-CM | POA: Diagnosis not present

## 2016-04-10 DIAGNOSIS — N186 End stage renal disease: Secondary | ICD-10-CM | POA: Diagnosis not present

## 2016-04-10 DIAGNOSIS — D509 Iron deficiency anemia, unspecified: Secondary | ICD-10-CM | POA: Diagnosis not present

## 2016-04-13 DIAGNOSIS — Z23 Encounter for immunization: Secondary | ICD-10-CM | POA: Diagnosis not present

## 2016-04-13 DIAGNOSIS — N2581 Secondary hyperparathyroidism of renal origin: Secondary | ICD-10-CM | POA: Diagnosis not present

## 2016-04-13 DIAGNOSIS — N186 End stage renal disease: Secondary | ICD-10-CM | POA: Diagnosis not present

## 2016-04-13 DIAGNOSIS — Z992 Dependence on renal dialysis: Secondary | ICD-10-CM | POA: Diagnosis not present

## 2016-04-13 DIAGNOSIS — D509 Iron deficiency anemia, unspecified: Secondary | ICD-10-CM | POA: Diagnosis not present

## 2016-04-14 DIAGNOSIS — I132 Hypertensive heart and chronic kidney disease with heart failure and with stage 5 chronic kidney disease, or end stage renal disease: Secondary | ICD-10-CM | POA: Diagnosis not present

## 2016-04-14 DIAGNOSIS — N186 End stage renal disease: Secondary | ICD-10-CM | POA: Diagnosis not present

## 2016-04-14 DIAGNOSIS — E1122 Type 2 diabetes mellitus with diabetic chronic kidney disease: Secondary | ICD-10-CM | POA: Diagnosis not present

## 2016-04-14 DIAGNOSIS — I5032 Chronic diastolic (congestive) heart failure: Secondary | ICD-10-CM | POA: Diagnosis not present

## 2016-04-14 DIAGNOSIS — I890 Lymphedema, not elsewhere classified: Secondary | ICD-10-CM | POA: Diagnosis not present

## 2016-04-14 DIAGNOSIS — L03115 Cellulitis of right lower limb: Secondary | ICD-10-CM | POA: Diagnosis not present

## 2016-04-15 DIAGNOSIS — N2581 Secondary hyperparathyroidism of renal origin: Secondary | ICD-10-CM | POA: Diagnosis not present

## 2016-04-15 DIAGNOSIS — Z992 Dependence on renal dialysis: Secondary | ICD-10-CM | POA: Diagnosis not present

## 2016-04-15 DIAGNOSIS — Z23 Encounter for immunization: Secondary | ICD-10-CM | POA: Diagnosis not present

## 2016-04-15 DIAGNOSIS — D509 Iron deficiency anemia, unspecified: Secondary | ICD-10-CM | POA: Diagnosis not present

## 2016-04-15 DIAGNOSIS — N186 End stage renal disease: Secondary | ICD-10-CM | POA: Diagnosis not present

## 2016-04-16 DIAGNOSIS — E1122 Type 2 diabetes mellitus with diabetic chronic kidney disease: Secondary | ICD-10-CM | POA: Diagnosis not present

## 2016-04-16 DIAGNOSIS — N186 End stage renal disease: Secondary | ICD-10-CM | POA: Diagnosis not present

## 2016-04-16 DIAGNOSIS — L03115 Cellulitis of right lower limb: Secondary | ICD-10-CM | POA: Diagnosis not present

## 2016-04-16 DIAGNOSIS — I132 Hypertensive heart and chronic kidney disease with heart failure and with stage 5 chronic kidney disease, or end stage renal disease: Secondary | ICD-10-CM | POA: Diagnosis not present

## 2016-04-16 DIAGNOSIS — I890 Lymphedema, not elsewhere classified: Secondary | ICD-10-CM | POA: Diagnosis not present

## 2016-04-16 DIAGNOSIS — I5032 Chronic diastolic (congestive) heart failure: Secondary | ICD-10-CM | POA: Diagnosis not present

## 2016-04-17 DIAGNOSIS — D509 Iron deficiency anemia, unspecified: Secondary | ICD-10-CM | POA: Diagnosis not present

## 2016-04-17 DIAGNOSIS — Z23 Encounter for immunization: Secondary | ICD-10-CM | POA: Diagnosis not present

## 2016-04-17 DIAGNOSIS — N186 End stage renal disease: Secondary | ICD-10-CM | POA: Diagnosis not present

## 2016-04-17 DIAGNOSIS — N2581 Secondary hyperparathyroidism of renal origin: Secondary | ICD-10-CM | POA: Diagnosis not present

## 2016-04-17 DIAGNOSIS — Z992 Dependence on renal dialysis: Secondary | ICD-10-CM | POA: Diagnosis not present

## 2016-04-18 DIAGNOSIS — N186 End stage renal disease: Secondary | ICD-10-CM | POA: Diagnosis not present

## 2016-04-18 DIAGNOSIS — Z992 Dependence on renal dialysis: Secondary | ICD-10-CM | POA: Diagnosis not present

## 2016-04-21 DIAGNOSIS — I132 Hypertensive heart and chronic kidney disease with heart failure and with stage 5 chronic kidney disease, or end stage renal disease: Secondary | ICD-10-CM | POA: Diagnosis not present

## 2016-04-21 DIAGNOSIS — N2581 Secondary hyperparathyroidism of renal origin: Secondary | ICD-10-CM | POA: Diagnosis not present

## 2016-04-21 DIAGNOSIS — N186 End stage renal disease: Secondary | ICD-10-CM | POA: Diagnosis not present

## 2016-04-21 DIAGNOSIS — D509 Iron deficiency anemia, unspecified: Secondary | ICD-10-CM | POA: Diagnosis not present

## 2016-04-21 DIAGNOSIS — E1122 Type 2 diabetes mellitus with diabetic chronic kidney disease: Secondary | ICD-10-CM | POA: Diagnosis not present

## 2016-04-21 DIAGNOSIS — I890 Lymphedema, not elsewhere classified: Secondary | ICD-10-CM | POA: Diagnosis not present

## 2016-04-21 DIAGNOSIS — I5032 Chronic diastolic (congestive) heart failure: Secondary | ICD-10-CM | POA: Diagnosis not present

## 2016-04-21 DIAGNOSIS — L03115 Cellulitis of right lower limb: Secondary | ICD-10-CM | POA: Diagnosis not present

## 2016-04-21 DIAGNOSIS — Z992 Dependence on renal dialysis: Secondary | ICD-10-CM | POA: Diagnosis not present

## 2016-04-22 ENCOUNTER — Encounter: Payer: Self-pay | Admitting: Vascular Surgery

## 2016-04-22 DIAGNOSIS — N186 End stage renal disease: Secondary | ICD-10-CM | POA: Diagnosis not present

## 2016-04-22 DIAGNOSIS — N2581 Secondary hyperparathyroidism of renal origin: Secondary | ICD-10-CM | POA: Diagnosis not present

## 2016-04-22 DIAGNOSIS — Z992 Dependence on renal dialysis: Secondary | ICD-10-CM | POA: Diagnosis not present

## 2016-04-22 DIAGNOSIS — D509 Iron deficiency anemia, unspecified: Secondary | ICD-10-CM | POA: Diagnosis not present

## 2016-04-23 ENCOUNTER — Encounter: Payer: Self-pay | Admitting: Vascular Surgery

## 2016-04-23 DIAGNOSIS — N186 End stage renal disease: Secondary | ICD-10-CM | POA: Diagnosis not present

## 2016-04-23 DIAGNOSIS — I132 Hypertensive heart and chronic kidney disease with heart failure and with stage 5 chronic kidney disease, or end stage renal disease: Secondary | ICD-10-CM | POA: Diagnosis not present

## 2016-04-23 DIAGNOSIS — I890 Lymphedema, not elsewhere classified: Secondary | ICD-10-CM | POA: Diagnosis not present

## 2016-04-23 DIAGNOSIS — L03115 Cellulitis of right lower limb: Secondary | ICD-10-CM | POA: Diagnosis not present

## 2016-04-23 DIAGNOSIS — E1122 Type 2 diabetes mellitus with diabetic chronic kidney disease: Secondary | ICD-10-CM | POA: Diagnosis not present

## 2016-04-23 DIAGNOSIS — I5032 Chronic diastolic (congestive) heart failure: Secondary | ICD-10-CM | POA: Diagnosis not present

## 2016-04-24 DIAGNOSIS — N186 End stage renal disease: Secondary | ICD-10-CM | POA: Diagnosis not present

## 2016-04-24 DIAGNOSIS — D509 Iron deficiency anemia, unspecified: Secondary | ICD-10-CM | POA: Diagnosis not present

## 2016-04-24 DIAGNOSIS — Z992 Dependence on renal dialysis: Secondary | ICD-10-CM | POA: Diagnosis not present

## 2016-04-24 DIAGNOSIS — N2581 Secondary hyperparathyroidism of renal origin: Secondary | ICD-10-CM | POA: Diagnosis not present

## 2016-04-27 ENCOUNTER — Ambulatory Visit (INDEPENDENT_AMBULATORY_CARE_PROVIDER_SITE_OTHER): Payer: Medicare Other | Admitting: Vascular Surgery

## 2016-04-27 ENCOUNTER — Encounter: Payer: Self-pay | Admitting: Vascular Surgery

## 2016-04-27 ENCOUNTER — Ambulatory Visit (HOSPITAL_COMMUNITY)
Admission: RE | Admit: 2016-04-27 | Discharge: 2016-04-27 | Disposition: A | Discharge status: Home / Self Care | Payer: Medicare Other | Source: Ambulatory Visit | Attending: Vascular Surgery | Admitting: Vascular Surgery

## 2016-04-27 ENCOUNTER — Other Ambulatory Visit: Payer: Self-pay

## 2016-04-27 VITALS — BP 100/79 | HR 85 | Temp 98.0°F | Resp 16 | Ht 65.0 in | Wt 262.0 lb

## 2016-04-27 DIAGNOSIS — Z4931 Encounter for adequacy testing for hemodialysis: Secondary | ICD-10-CM | POA: Diagnosis not present

## 2016-04-27 DIAGNOSIS — D509 Iron deficiency anemia, unspecified: Secondary | ICD-10-CM | POA: Diagnosis not present

## 2016-04-27 DIAGNOSIS — E7800 Pure hypercholesterolemia, unspecified: Secondary | ICD-10-CM | POA: Insufficient documentation

## 2016-04-27 DIAGNOSIS — E1122 Type 2 diabetes mellitus with diabetic chronic kidney disease: Secondary | ICD-10-CM | POA: Insufficient documentation

## 2016-04-27 DIAGNOSIS — I132 Hypertensive heart and chronic kidney disease with heart failure and with stage 5 chronic kidney disease, or end stage renal disease: Secondary | ICD-10-CM | POA: Insufficient documentation

## 2016-04-27 DIAGNOSIS — I509 Heart failure, unspecified: Secondary | ICD-10-CM | POA: Diagnosis not present

## 2016-04-27 DIAGNOSIS — G4730 Sleep apnea, unspecified: Secondary | ICD-10-CM | POA: Diagnosis not present

## 2016-04-27 DIAGNOSIS — N186 End stage renal disease: Secondary | ICD-10-CM

## 2016-04-27 DIAGNOSIS — Z992 Dependence on renal dialysis: Secondary | ICD-10-CM

## 2016-04-27 DIAGNOSIS — N2581 Secondary hyperparathyroidism of renal origin: Secondary | ICD-10-CM | POA: Diagnosis not present

## 2016-04-27 NOTE — Progress Notes (Signed)
   Patient name: [name] MRN: [id number] DOB: 1966-11-19 Sex: female  REASON FOR VISIT: Follow-up from AV fistula creation left radiocephalic on 33/35/4562  HPI: [name] is a 50 y.o. female here today for follow-up. She is currently dialyzing via a right IJ catheter. She has a left radiocephalic fistula. She's had that nice healing of her incisions. She has no steal symptoms.  Current Outpatient Prescriptions  Medication Sig Dispense Refill  . acetaminophen (TYLENOL) 325 MG tablet Take 650 mg by mouth every 6 (six) hours as needed for fever.     Marland Kitchen albuterol (PROVENTIL) (2.5 MG/3ML) 0.083% nebulizer solution Take 2.5 mg by nebulization every 6 (six) hours as needed for wheezing or shortness of breath.    . ALPRAZolam (XANAX) 0.5 MG tablet Take 0.5 mg by mouth 3 (three) times daily as needed for anxiety or sleep.     Marland Kitchen amLODipine (NORVASC) 10 MG tablet Take 10 mg by mouth daily.    Marland Kitchen b complex-vitamin c-folic acid (NEPHRO-VITE) 0.8 MG TABS tablet Take 1 tablet by mouth daily.    . diphenhydrAMINE (BENADRYL) 25 mg capsule Take 25 mg by mouth every 6 (six) hours as needed for allergies.    Marland Kitchen insulin NPH-insulin regular (NOVOLIN 70/30) (70-30) 100 UNIT/ML injection Inject 60 Units into the skin 2 (two) times daily. Reported on 03/12/2016    . metoprolol (LOPRESSOR) 50 MG tablet Take 0.5 tablets (25 mg total) by mouth 2 (two) times daily. 30 tablet 12  . ondansetron (ZOFRAN-ODT) 4 MG disintegrating tablet Take 4 mg by mouth every 8 (eight) hours as needed for nausea or vomiting.    . Oxycodone HCl 10 MG TABS Take 1 tablet (10 mg total) by mouth 3 (three) times daily as needed (pain). 15 tablet 0  . predniSONE (DELTASONE) 20 MG tablet Take 2 tablets (40 mg total) by mouth daily with breakfast. For the next four days 8 tablet 0  . [DISCONTINUED] insulin lispro (HUMALOG) 100 UNIT/ML injection Inject 60 Units into the skin 2 (two) times daily.       No current facility-administered  medications for this visit.       PHYSICAL EXAM: Filed Vitals:   04/27/16 1357  BP: 100/79  Pulse: 85  Temp: 98 F (36.7 C)  Resp: 16  Height: _0  (1.651 m)  Weight: 262 lb (118.842 kg)  SpO2: 95%    GENERAL: The patient is a well-nourished female, in no acute distress. The vital signs are documented above. Good thrill with good size maturation at the wrist on the left radiocephalic fistula. She does have a good thrill in her forearm but the vein does appear to run quite deep. I imaged this with SonoSite ultrasound that she does have a very nice caliber vein but this does run nearly a centimeter under the skin  Formal duplex of this shows the same with diameter and the 6 mm range but does run deep  MEDICAL ISSUES: Good size maturation of her left radiocephalic fistula. Have recommended superficial mobilization of her vein as an outpatient. She has hemodialysis on Tuesday Thursday and Saturday. Will schedule this next week for one of my partners as an outpatient.  Curt Jews [facility] [place]: [phone]

## 2016-04-28 DIAGNOSIS — E1122 Type 2 diabetes mellitus with diabetic chronic kidney disease: Secondary | ICD-10-CM | POA: Diagnosis not present

## 2016-04-28 DIAGNOSIS — I132 Hypertensive heart and chronic kidney disease with heart failure and with stage 5 chronic kidney disease, or end stage renal disease: Secondary | ICD-10-CM | POA: Diagnosis not present

## 2016-04-28 DIAGNOSIS — N186 End stage renal disease: Secondary | ICD-10-CM | POA: Diagnosis not present

## 2016-04-28 DIAGNOSIS — I890 Lymphedema, not elsewhere classified: Secondary | ICD-10-CM | POA: Diagnosis not present

## 2016-04-28 DIAGNOSIS — I5032 Chronic diastolic (congestive) heart failure: Secondary | ICD-10-CM | POA: Diagnosis not present

## 2016-04-28 DIAGNOSIS — L03115 Cellulitis of right lower limb: Secondary | ICD-10-CM | POA: Diagnosis not present

## 2016-04-29 DIAGNOSIS — N2581 Secondary hyperparathyroidism of renal origin: Secondary | ICD-10-CM | POA: Diagnosis not present

## 2016-04-29 DIAGNOSIS — Z992 Dependence on renal dialysis: Secondary | ICD-10-CM | POA: Diagnosis not present

## 2016-04-29 DIAGNOSIS — N186 End stage renal disease: Secondary | ICD-10-CM | POA: Diagnosis not present

## 2016-04-29 DIAGNOSIS — D509 Iron deficiency anemia, unspecified: Secondary | ICD-10-CM | POA: Diagnosis not present

## 2016-04-30 DIAGNOSIS — J449 Chronic obstructive pulmonary disease, unspecified: Secondary | ICD-10-CM | POA: Diagnosis not present

## 2016-04-30 DIAGNOSIS — D631 Anemia in chronic kidney disease: Secondary | ICD-10-CM | POA: Diagnosis not present

## 2016-04-30 DIAGNOSIS — I132 Hypertensive heart and chronic kidney disease with heart failure and with stage 5 chronic kidney disease, or end stage renal disease: Secondary | ICD-10-CM | POA: Diagnosis not present

## 2016-04-30 DIAGNOSIS — I890 Lymphedema, not elsewhere classified: Secondary | ICD-10-CM | POA: Diagnosis not present

## 2016-04-30 DIAGNOSIS — E1122 Type 2 diabetes mellitus with diabetic chronic kidney disease: Secondary | ICD-10-CM | POA: Diagnosis not present

## 2016-04-30 DIAGNOSIS — E662 Morbid (severe) obesity with alveolar hypoventilation: Secondary | ICD-10-CM | POA: Diagnosis not present

## 2016-04-30 DIAGNOSIS — I5032 Chronic diastolic (congestive) heart failure: Secondary | ICD-10-CM | POA: Diagnosis not present

## 2016-04-30 DIAGNOSIS — Z992 Dependence on renal dialysis: Secondary | ICD-10-CM | POA: Diagnosis not present

## 2016-04-30 DIAGNOSIS — I2510 Atherosclerotic heart disease of native coronary artery without angina pectoris: Secondary | ICD-10-CM | POA: Diagnosis not present

## 2016-04-30 DIAGNOSIS — R531 Weakness: Secondary | ICD-10-CM | POA: Diagnosis not present

## 2016-04-30 DIAGNOSIS — M797 Fibromyalgia: Secondary | ICD-10-CM | POA: Diagnosis not present

## 2016-04-30 DIAGNOSIS — N186 End stage renal disease: Secondary | ICD-10-CM | POA: Diagnosis not present

## 2016-05-01 DIAGNOSIS — Z992 Dependence on renal dialysis: Secondary | ICD-10-CM | POA: Diagnosis not present

## 2016-05-01 DIAGNOSIS — D509 Iron deficiency anemia, unspecified: Secondary | ICD-10-CM | POA: Diagnosis not present

## 2016-05-01 DIAGNOSIS — N186 End stage renal disease: Secondary | ICD-10-CM | POA: Diagnosis not present

## 2016-05-01 DIAGNOSIS — N2581 Secondary hyperparathyroidism of renal origin: Secondary | ICD-10-CM | POA: Diagnosis not present

## 2016-05-03 DIAGNOSIS — E1122 Type 2 diabetes mellitus with diabetic chronic kidney disease: Secondary | ICD-10-CM | POA: Diagnosis not present

## 2016-05-03 DIAGNOSIS — I890 Lymphedema, not elsewhere classified: Secondary | ICD-10-CM | POA: Diagnosis not present

## 2016-05-03 DIAGNOSIS — I132 Hypertensive heart and chronic kidney disease with heart failure and with stage 5 chronic kidney disease, or end stage renal disease: Secondary | ICD-10-CM | POA: Diagnosis not present

## 2016-05-03 DIAGNOSIS — N186 End stage renal disease: Secondary | ICD-10-CM | POA: Diagnosis not present

## 2016-05-03 DIAGNOSIS — R531 Weakness: Secondary | ICD-10-CM | POA: Diagnosis not present

## 2016-05-03 DIAGNOSIS — I5032 Chronic diastolic (congestive) heart failure: Secondary | ICD-10-CM | POA: Diagnosis not present

## 2016-05-04 ENCOUNTER — Encounter (HOSPITAL_COMMUNITY): Payer: Self-pay | Admitting: *Deleted

## 2016-05-04 NOTE — Progress Notes (Addendum)
[name] reports that she stopped taking Insulin, "last A1C was 5 something."  Reports that Dr [name] is her PCP and manages DM, she has not told him that she stopped Insulin. I will request office records. Patient is not sure if A1c was collected at PCP office or [facility] in [place].

## 2016-05-05 ENCOUNTER — Encounter (HOSPITAL_COMMUNITY): Payer: Self-pay | Admitting: Vascular Surgery

## 2016-05-05 DIAGNOSIS — N186 End stage renal disease: Secondary | ICD-10-CM | POA: Diagnosis not present

## 2016-05-05 DIAGNOSIS — Z992 Dependence on renal dialysis: Secondary | ICD-10-CM | POA: Diagnosis not present

## 2016-05-05 DIAGNOSIS — N2581 Secondary hyperparathyroidism of renal origin: Secondary | ICD-10-CM | POA: Diagnosis not present

## 2016-05-05 DIAGNOSIS — D509 Iron deficiency anemia, unspecified: Secondary | ICD-10-CM | POA: Diagnosis not present

## 2016-05-05 NOTE — Progress Notes (Signed)
Anesthesia Chart Review: SAME DAY WORK-UP.  Patient is a 50 year old female scheduled for superficialization of left arm AVF on 05/07/16 by Dr. Kellie Simmering. She is s/p creation of left radiocephalic AVF on 5/64/33 (under MAC).  History includes morbid obesity, ESRD on TTS, home O2 (2L/[place]), HTN, hypercholesterolemia, Chiari malformation s/p craniectomy suboccipital with cervical laminectomy, fibromyalgia, OSA with obesity hypoventilation syndrome, asthma, DM2, CAD (40-50% LAD '10), CHF, anxiety, lymphedema, thyroid cancer s/p partial thyroidectomy, T&A '04, hysterectomy, left [place]-a-cath 07/05/12 (for chronic thrombophlebitis, failed PICC line attempts).  For anesthesia complications, she had respiratory arrest in IR after receiving 59m Versed and 100 umg Fentanyl for conscious sedation in preparation of placement of a permanent hemodialysis catheter. Respiratory arrest was recognized immediately, and she received Narcan and romazicon to reverse her. Face shield O2 and two rounds of chest compression for a weak tready radial pulse. She slowly aroused and awakened after approximately 2 minutes. The procedure was aborted, and she was transferred to the ICU for observation. She ultimately underwent placement of a right IJ tunneled hemodialysis catheter in IR on 12/09/15 without local lidocaine and no IV sedation.    PCP is Dr. ESinda Du Nephrologist is Dr. BLowanda Foster When asked about cardiology follow-up, she reported that she had only seen cardiology in the hospital (Dr. BGwenlyn Found2010, Dr. MTami Ribas2007). She denied further chest pain and reports she tolerated hemodialysis without hypotension or chest pain. She denied any acute respiratory issues.    Meds include albuterol, Xanax, Norvasc, Lopressor, Zofran, oxycodone, promethazine. Patient told her PAT phone interviewing RN that she thought her last A1c was "5 something." She is no longer taking her insulin. She had been on Novolin 70/30. She tells me that she  is still checking her CBGs twice a day at home with results ranging from 95-120. She tells me that she is actually unsure of when her last A1c was done. We discussed that for anesthesia purposes, we would want her fasting glucose < 200, but for long term purposes she should be routine follow-up with her medical doctor for continued diabetes management to help prevent long term complications.   04/07/16 EKG: SR, consider LAE, cannot rule out infarct infarct (old). Small, lateral q waves in V5-6 appear insignificant. Overall, I think tracing is similar to 11/14/15 except T wave in V2 is now negative.   11/14/11 Echo: Study Conclusions - Left ventricle: The cavity size was normal. Wall thickness was increased in a pattern of moderate LVH. Systolic function was normal. The estimated ejection fraction was in the range of 55% to 60%. Regional wall motion abnormalities cannot be excluded. Features are consistent with a pseudonormal left ventricular filling pattern, with concomitant abnormal relaxation and increased filling pressure (grade 2 diastolic dysfunction). - Aortic valve: Trivial regurgitation. - Left atrium: The atrium was mildly dilated. - Right atrium: The atrium was mildly dilated. - Pericardium, extracardiac: A small pericardial effusion was identified.  04/29/09 Cardiac Cath (Dr. JQuay Burow: SELECTIVE CORONARY ANGIOGRAPHY: 1. Left main normal. 2. LAD; the LAD had a 40-50% segmental stenosis in the mid portion  after the second small diagonal branch. 3. Circumflex; dominant and free of significant disease. 4. Right coronary artery; nondominant and free of significant disease. 5. Left ventriculography; RAO left ventriculogram was performed using  25 mL of Visipaque dye at 12 mL per second. The overall LVEF was  estimated greater than 60% without focal wall motion abnormalities. IMPRESSION: Ms. WStradleyhas mild-to-moderate segmental mid left anterior  descending disease  which appears to be fairly similar by description to her anatomy 3 years ago. At this point, we will continue with medical therapy, potentially obtain an outpatient stress test on her. If she continues to have chest pain, she may need intracoronary vascular ultrasound-guided intervention.  04/07/16 CXR: IMPRESSION: Cardiac enlargement and vascular congestion without overt pulmonary edema.  12/05/15 Spirometry: FVC 1.12 (37%), FEV1 0.96 (39%), FEF25-75% 0.97 (38%). Spirometry shows severe ventilatory defect with small airway airflow obstruction.   She is for labs on arrival which will include a glucose.   Patient with 40-50% LAD lesion in 2010. I did encourage her to discuss with Dr. [name] when he felt she should follow-up again with cardiology, but because she denied chest pain, is tolerating hemodialysis three times a week, and tolerated AVF creation 03/15/16 then I would anticipate that she could proceed with vascular access procedure as planned as long as labs are acceptable and no acute changes. Anesthesiologist Dr. Linna Caprice agrees with this plan.  [facility] Short Stay Center/Anesthesiology Phone [phone] 05/05/2016 2:31 PM

## 2016-05-06 DIAGNOSIS — N2581 Secondary hyperparathyroidism of renal origin: Secondary | ICD-10-CM | POA: Diagnosis not present

## 2016-05-06 DIAGNOSIS — E1122 Type 2 diabetes mellitus with diabetic chronic kidney disease: Secondary | ICD-10-CM | POA: Diagnosis not present

## 2016-05-06 DIAGNOSIS — Z992 Dependence on renal dialysis: Secondary | ICD-10-CM | POA: Diagnosis not present

## 2016-05-06 DIAGNOSIS — I890 Lymphedema, not elsewhere classified: Secondary | ICD-10-CM | POA: Diagnosis not present

## 2016-05-06 DIAGNOSIS — D509 Iron deficiency anemia, unspecified: Secondary | ICD-10-CM | POA: Diagnosis not present

## 2016-05-06 DIAGNOSIS — I132 Hypertensive heart and chronic kidney disease with heart failure and with stage 5 chronic kidney disease, or end stage renal disease: Secondary | ICD-10-CM | POA: Diagnosis not present

## 2016-05-06 DIAGNOSIS — I5032 Chronic diastolic (congestive) heart failure: Secondary | ICD-10-CM | POA: Diagnosis not present

## 2016-05-06 DIAGNOSIS — N186 End stage renal disease: Secondary | ICD-10-CM | POA: Diagnosis not present

## 2016-05-06 DIAGNOSIS — R531 Weakness: Secondary | ICD-10-CM | POA: Diagnosis not present

## 2016-05-07 DIAGNOSIS — E1122 Type 2 diabetes mellitus with diabetic chronic kidney disease: Secondary | ICD-10-CM | POA: Diagnosis not present

## 2016-05-07 DIAGNOSIS — I2510 Atherosclerotic heart disease of native coronary artery without angina pectoris: Secondary | ICD-10-CM | POA: Diagnosis not present

## 2016-05-07 DIAGNOSIS — Z794 Long term (current) use of insulin: Secondary | ICD-10-CM | POA: Diagnosis not present

## 2016-05-07 DIAGNOSIS — I120 Hypertensive chronic kidney disease with stage 5 chronic kidney disease or end stage renal disease: Secondary | ICD-10-CM | POA: Diagnosis not present

## 2016-05-07 DIAGNOSIS — Z992 Dependence on renal dialysis: Secondary | ICD-10-CM | POA: Diagnosis not present

## 2016-05-07 DIAGNOSIS — N186 End stage renal disease: Secondary | ICD-10-CM | POA: Diagnosis not present

## 2016-05-08 DIAGNOSIS — D509 Iron deficiency anemia, unspecified: Secondary | ICD-10-CM | POA: Diagnosis not present

## 2016-05-08 DIAGNOSIS — N186 End stage renal disease: Secondary | ICD-10-CM | POA: Diagnosis not present

## 2016-05-08 DIAGNOSIS — Z992 Dependence on renal dialysis: Secondary | ICD-10-CM | POA: Diagnosis not present

## 2016-05-08 DIAGNOSIS — N2581 Secondary hyperparathyroidism of renal origin: Secondary | ICD-10-CM | POA: Diagnosis not present

## 2016-05-09 MED ORDER — SODIUM CHLORIDE 0.9 % IV SOLN
INTRAVENOUS | Status: DC
  Administered 2016-05-10: 10 mL/h via INTRAVENOUS
  Administered 2016-05-10: 13:00:00 via INTRAVENOUS

## 2016-05-09 MED ORDER — DEXTROSE 5 % IV SOLN
1.5000 g | INTRAVENOUS | Status: AC
  Administered 2016-05-10: 1.5 g via INTRAVENOUS
  Filled 2016-05-09: qty 1.5

## 2016-05-10 ENCOUNTER — Ambulatory Visit (HOSPITAL_COMMUNITY): Payer: Medicare Other | Admitting: Vascular Surgery

## 2016-05-10 ENCOUNTER — Encounter (HOSPITAL_COMMUNITY)
Admission: RE | Disposition: A | Discharge status: Home / Self Care | Payer: Self-pay | Source: Ambulatory Visit | Attending: Vascular Surgery

## 2016-05-10 ENCOUNTER — Ambulatory Visit (HOSPITAL_COMMUNITY)
Admission: RE | Admit: 2016-05-10 | Discharge: 2016-05-10 | Disposition: A | Discharge status: Home / Self Care | Payer: Medicare Other | Source: Ambulatory Visit | Attending: Vascular Surgery | Admitting: Vascular Surgery

## 2016-05-10 ENCOUNTER — Encounter (HOSPITAL_COMMUNITY): Payer: Self-pay | Admitting: Anesthesiology

## 2016-05-10 DIAGNOSIS — I2510 Atherosclerotic heart disease of native coronary artery without angina pectoris: Secondary | ICD-10-CM | POA: Diagnosis not present

## 2016-05-10 DIAGNOSIS — Z794 Long term (current) use of insulin: Secondary | ICD-10-CM | POA: Insufficient documentation

## 2016-05-10 DIAGNOSIS — I120 Hypertensive chronic kidney disease with stage 5 chronic kidney disease or end stage renal disease: Secondary | ICD-10-CM | POA: Insufficient documentation

## 2016-05-10 DIAGNOSIS — T82898A Other specified complication of vascular prosthetic devices, implants and grafts, initial encounter: Secondary | ICD-10-CM | POA: Diagnosis not present

## 2016-05-10 DIAGNOSIS — M797 Fibromyalgia: Secondary | ICD-10-CM | POA: Diagnosis not present

## 2016-05-10 DIAGNOSIS — Z992 Dependence on renal dialysis: Secondary | ICD-10-CM | POA: Insufficient documentation

## 2016-05-10 DIAGNOSIS — E1122 Type 2 diabetes mellitus with diabetic chronic kidney disease: Secondary | ICD-10-CM | POA: Diagnosis not present

## 2016-05-10 DIAGNOSIS — N186 End stage renal disease: Secondary | ICD-10-CM | POA: Insufficient documentation

## 2016-05-10 DIAGNOSIS — I509 Heart failure, unspecified: Secondary | ICD-10-CM | POA: Diagnosis not present

## 2016-05-10 HISTORY — DX: Pneumonia, unspecified organism: J18.9

## 2016-05-10 HISTORY — DX: Unspecified asthma, uncomplicated: J45.909

## 2016-05-10 HISTORY — DX: Atherosclerotic heart disease of native coronary artery without angina pectoris: I25.10

## 2016-05-10 HISTORY — DX: Malignant (primary) neoplasm, unspecified: C80.1

## 2016-05-10 HISTORY — DX: Constipation, unspecified: K59.00

## 2016-05-10 HISTORY — PX: FISTULA SUPERFICIALIZATION: SHX6341

## 2016-05-10 LAB — POCT I-STAT 4, (NA,K, GLUC, HGB,HCT)
Glucose, Bld: 173 mg/dL — ABNORMAL HIGH (ref 65–99)
HCT: 41 % (ref 36.0–46.0)
Hemoglobin: 13.9 g/dL (ref 12.0–15.0)
Potassium: 4.2 mmol/L (ref 3.5–5.1)
SODIUM: 134 mmol/L — AB (ref 135–145)

## 2016-05-10 LAB — GLUCOSE, CAPILLARY: Glucose-Capillary: 151 mg/dL — ABNORMAL HIGH (ref 65–99)

## 2016-05-10 SURGERY — FISTULA SUPERFICIALIZATION
Anesthesia: General | Site: Arm Upper | Laterality: Left

## 2016-05-10 MED ORDER — LIDOCAINE-EPINEPHRINE 0.5 %-1:200000 IJ SOLN
INTRAMUSCULAR | Status: AC
  Filled 2016-05-10: qty 1

## 2016-05-10 MED ORDER — CHLORHEXIDINE GLUCONATE CLOTH 2 % EX PADS: 6.0000 | MEDICATED_PAD | Freq: Once | CUTANEOUS | Status: DC

## 2016-05-10 MED ORDER — EPHEDRINE SULFATE 50 MG/ML IJ SOLN
INTRAMUSCULAR | Status: DC | PRN
  Administered 2016-05-10: 10 mg via INTRAVENOUS

## 2016-05-10 MED ORDER — METOPROLOL TARTRATE 12.5 MG HALF TABLET
ORAL_TABLET | ORAL | Status: AC
  Administered 2016-05-10: 25 mg
  Filled 2016-05-10: qty 2

## 2016-05-10 MED ORDER — LIDOCAINE-EPINEPHRINE 0.5 %-1:200000 IJ SOLN
INTRAMUSCULAR | Status: DC | PRN
  Administered 2016-05-10: 29 mL

## 2016-05-10 MED ORDER — PHENYLEPHRINE HCL 10 MG/ML IJ SOLN
INTRAMUSCULAR | Status: DC | PRN
  Administered 2016-05-10: 120 ug via INTRAVENOUS
  Administered 2016-05-10: 160 ug via INTRAVENOUS

## 2016-05-10 MED ORDER — DEXTROSE 5 % IV SOLN
10.0000 mg | INTRAVENOUS | Status: DC | PRN
  Administered 2016-05-10: 40 ug/min via INTRAVENOUS

## 2016-05-10 MED ORDER — ONDANSETRON HCL 4 MG/2ML IJ SOLN: 4.0000 mg | Freq: Once | INTRAMUSCULAR | Status: DC | PRN

## 2016-05-10 MED ORDER — FENTANYL CITRATE (PF) 250 MCG/5ML IJ SOLN
INTRAMUSCULAR | Status: AC
  Filled 2016-05-10: qty 5

## 2016-05-10 MED ORDER — SODIUM CHLORIDE 0.9 % IV SOLN
INTRAVENOUS | Status: DC | PRN
  Administered 2016-05-10: 500 mL

## 2016-05-10 MED ORDER — MEPERIDINE HCL 25 MG/ML IJ SOLN: 6.2500 mg | INTRAMUSCULAR | Status: DC | PRN

## 2016-05-10 MED ORDER — 0.9 % SODIUM CHLORIDE (POUR BTL) OPTIME
TOPICAL | Status: DC | PRN
  Administered 2016-05-10: 1000 mL

## 2016-05-10 MED ORDER — MIDAZOLAM HCL 5 MG/5ML IJ SOLN
INTRAMUSCULAR | Status: DC | PRN
  Administered 2016-05-10: 2 mg via INTRAVENOUS

## 2016-05-10 MED ORDER — SODIUM CHLORIDE 0.9 % IV SOLN: INTRAVENOUS | Status: DC

## 2016-05-10 MED ORDER — HYDROMORPHONE HCL 1 MG/ML IJ SOLN: 0.2500 mg | INTRAMUSCULAR | Status: DC | PRN

## 2016-05-10 MED ORDER — PROPOFOL 500 MG/50ML IV EMUL
INTRAVENOUS | Status: DC | PRN
Start: 2016-05-10 — End: 2016-05-10
  Administered 2016-05-10: 75 ug/kg/min via INTRAVENOUS

## 2016-05-10 MED ORDER — OXYCODONE HCL 10 MG PO TABS: 10.0000 mg | ORAL_TABLET | Freq: Three times a day (TID) | ORAL | Status: DC | PRN

## 2016-05-10 MED ORDER — FENTANYL CITRATE (PF) 100 MCG/2ML IJ SOLN
INTRAMUSCULAR | Status: DC | PRN
Start: 2016-05-10 — End: 2016-05-10
  Administered 2016-05-10: 50 ug via INTRAVENOUS

## 2016-05-10 MED ORDER — PROPOFOL 10 MG/ML IV BOLUS
INTRAVENOUS | Status: AC
  Filled 2016-05-10: qty 20

## 2016-05-10 MED ORDER — MIDAZOLAM HCL 2 MG/2ML IJ SOLN
INTRAMUSCULAR | Status: AC
  Filled 2016-05-10: qty 2

## 2016-05-10 SURGICAL SUPPLY — 30 items
BANDAGE ACE 4X5 VEL STRL LF (GAUZE/BANDAGES/DRESSINGS) ×3 IMPLANT
BENZOIN TINCTURE PRP APPL 2/3 (GAUZE/BANDAGES/DRESSINGS) ×3 IMPLANT
BNDG GAUZE ELAST 4 BULKY (GAUZE/BANDAGES/DRESSINGS) ×3 IMPLANT
CANISTER SUCTION 2500CC (MISCELLANEOUS) ×3 IMPLANT
CLIP LIGATING EXTRA MED SLVR (CLIP) ×3 IMPLANT
CLIP LIGATING EXTRA SM BLUE (MISCELLANEOUS) ×3 IMPLANT
CLOSURE STERI-STRIP 1/2X4 (GAUZE/BANDAGES/DRESSINGS) ×1
CLOSURE WOUND 1/2 X4 (GAUZE/BANDAGES/DRESSINGS) ×1
CLSR STERI-STRIP ANTIMIC 1/2X4 (GAUZE/BANDAGES/DRESSINGS) ×2 IMPLANT
COVER PROBE W GEL 5X96 (DRAPES) ×3 IMPLANT
DECANTER SPIKE VIAL GLASS SM (MISCELLANEOUS) ×3 IMPLANT
ELECT REM PT RETURN 9FT ADLT (ELECTROSURGICAL) ×3
ELECTRODE REM PT RTRN 9FT ADLT (ELECTROSURGICAL) ×1 IMPLANT
GAUZE SPONGE 4X4 12PLY STRL (GAUZE/BANDAGES/DRESSINGS) ×3 IMPLANT
GEL ULTRASOUND 20GR AQUASONIC (MISCELLANEOUS) IMPLANT
GLOVE SS BIOGEL STRL SZ 7.5 (GLOVE) ×1 IMPLANT
GLOVE SUPERSENSE BIOGEL SZ 7.5 (GLOVE) ×2
GOWN STRL REUS W/ TWL LRG LVL3 (GOWN DISPOSABLE) ×3 IMPLANT
GOWN STRL REUS W/TWL LRG LVL3 (GOWN DISPOSABLE) ×6
KIT BASIN OR (CUSTOM PROCEDURE TRAY) ×3 IMPLANT
KIT ROOM TURNOVER OR (KITS) ×3 IMPLANT
NS IRRIG 1000ML POUR BTL (IV SOLUTION) ×3 IMPLANT
PACK CV ACCESS (CUSTOM PROCEDURE TRAY) ×3 IMPLANT
PAD ARMBOARD 7.5X6 YLW CONV (MISCELLANEOUS) ×6 IMPLANT
STRIP CLOSURE SKIN 1/2X4 (GAUZE/BANDAGES/DRESSINGS) ×2 IMPLANT
SUT PROLENE 6 0 CC (SUTURE) ×3 IMPLANT
SUT VIC AB 3-0 SH 27 (SUTURE) ×4
SUT VIC AB 3-0 SH 27X BRD (SUTURE) ×2 IMPLANT
UNDERPAD 30X30 INCONTINENT (UNDERPADS AND DIAPERS) ×3 IMPLANT
WATER STERILE IRR 1000ML POUR (IV SOLUTION) ×3 IMPLANT

## 2016-05-10 NOTE — Anesthesia Postprocedure Evaluation (Signed)
Anesthesia Post Note  Patient: [name]  Procedure(s) Performed: Procedure(s) (LRB): Left Arm FISTULA SUPERFICIALIZATION (Left)  Patient location during evaluation: PACU Anesthesia Type: MAC Level of consciousness: awake and alert Pain management: pain level controlled Vital Signs Assessment: post-procedure vital signs reviewed and stable Respiratory status: spontaneous breathing, nonlabored ventilation, respiratory function stable and patient connected to nasal cannula oxygen Cardiovascular status: stable and blood pressure returned to baseline Anesthetic complications: no    Last Vitals:  Filed Vitals:   05/10/16 1501 05/10/16 1509  BP: 104/58 122/61  Pulse: 72 77  Temp: 36.7 C   Resp: 17     Last Pain:  Filed Vitals:   05/10/16 1510  PainSc: 0-No pain                 Kaleel Schmieder DAVID

## 2016-05-10 NOTE — Transfer of Care (Signed)
Immediate Anesthesia Transfer of Care Note  Patient: [name]  Procedure(s) Performed: Procedure(s): Left Arm FISTULA SUPERFICIALIZATION (Left)  Patient Location: PACU  Anesthesia Type:MAC  Level of Consciousness: awake, alert , oriented and patient cooperative  Airway & Oxygen Therapy: Patient Spontanous Breathing  Post-op Assessment: Report given to RN, Post -op Vital signs reviewed and stable and Patient moving all extremities  Post vital signs: Reviewed and stable  Last Vitals:  Filed Vitals:   05/10/16 1053  BP: 148/69  Pulse: 86  Temp: 36.7 C  Resp: 16    Last Pain: There were no vitals filed for this visit.    Patients Stated Pain Goal: 3 (69/86/14 8307)  Complications: No apparent anesthesia complications

## 2016-05-10 NOTE — Anesthesia Preprocedure Evaluation (Addendum)
Anesthesia Evaluation  Patient identified by MRN, date of birth, ID band Patient awake    Reviewed: Allergy & Precautions, NPO status , Patient's Chart, lab work & pertinent test results, reviewed documented beta blocker date and time   Airway Mallampati: II  TM Distance: >3 FB Neck ROM: Full    Dental  (+) Edentulous Upper, Partial Lower, Dental Advisory Given   Pulmonary shortness of breath and with exertion, asthma , sleep apnea, Continuous Positive Airway Pressure Ventilation and Oxygen sleep apnea ,    Pulmonary exam normal        Cardiovascular hypertension, Pt. on medications and Pt. on home beta blockers + CAD and +CHF  Normal cardiovascular exam     Neuro/Psych    GI/Hepatic   Endo/Other  diabetes, Type 2Morbid obesity  Renal/GU Dialysis and ESRFRenal disease     Musculoskeletal  (+) Fibromyalgia -  Abdominal   Peds  Hematology  (+) anemia ,   Anesthesia Other Findings   Reproductive/Obstetrics                           Anesthesia Physical Anesthesia Plan  ASA: III  Anesthesia Plan: MAC   Post-op Pain Management:    Induction: Intravenous  Airway Management Planned: Natural Airway  Additional Equipment:   Intra-op Plan:   Post-operative Plan: Extubation in OR  Informed Consent: I have reviewed the patients History and Physical, chart, labs and discussed the procedure including the risks, benefits and alternatives for the proposed anesthesia with the patient or authorized representative who has indicated his/her understanding and acceptance.   Dental advisory given  Plan Discussed with: CRNA and Surgeon  Anesthesia Plan Comments:        Anesthesia Quick Evaluation

## 2016-05-10 NOTE — Discharge Instructions (Signed)
° ° °  05/10/2016 [name] [id number] 07-29-1966  Surgeon(s): Rosetta Posner, MD  Procedure(s): Left Arm FISTULA SUPERFICIALIZATION  x Do not stick graft for 6 weeks

## 2016-05-10 NOTE — Interval H&P Note (Signed)
History and Physical Interval Note:  05/10/2016 12:16 PM  [name]  has presented today for surgery, with the diagnosis of End Stage Renal Disease N18.6  The various methods of treatment have been discussed with the patient and family. After consideration of risks, benefits and other options for treatment, the patient has consented to  Procedure(s): FISTULA SUPERFICIALIZATION (Left) as a surgical intervention .  The patient's history has been reviewed, patient examined, no change in status, stable for surgery.  I have reviewed the patient's chart and labs.  Questions were answered to the patient's satisfaction.     Curt Jews

## 2016-05-10 NOTE — H&P (View-Only) (Signed)
   Patient name: [name] MRN: [id number] DOB: October 28, 1966 Sex: female  REASON FOR VISIT: Follow-up from AV fistula creation left radiocephalic on 86/75/4492  HPI: [name] is a 50 y.o. female here today for follow-up. She is currently dialyzing via a right IJ catheter. She has a left radiocephalic fistula. She's had that nice healing of her incisions. She has no steal symptoms.  Current Outpatient Prescriptions  Medication Sig Dispense Refill  . acetaminophen (TYLENOL) 325 MG tablet Take 650 mg by mouth every 6 (six) hours as needed for fever.     Marland Kitchen albuterol (PROVENTIL) (2.5 MG/3ML) 0.083% nebulizer solution Take 2.5 mg by nebulization every 6 (six) hours as needed for wheezing or shortness of breath.    . ALPRAZolam (XANAX) 0.5 MG tablet Take 0.5 mg by mouth 3 (three) times daily as needed for anxiety or sleep.     Marland Kitchen amLODipine (NORVASC) 10 MG tablet Take 10 mg by mouth daily.    Marland Kitchen b complex-vitamin c-folic acid (NEPHRO-VITE) 0.8 MG TABS tablet Take 1 tablet by mouth daily.    . diphenhydrAMINE (BENADRYL) 25 mg capsule Take 25 mg by mouth every 6 (six) hours as needed for allergies.    Marland Kitchen insulin NPH-insulin regular (NOVOLIN 70/30) (70-30) 100 UNIT/ML injection Inject 60 Units into the skin 2 (two) times daily. Reported on 03/12/2016    . metoprolol (LOPRESSOR) 50 MG tablet Take 0.5 tablets (25 mg total) by mouth 2 (two) times daily. 30 tablet 12  . ondansetron (ZOFRAN-ODT) 4 MG disintegrating tablet Take 4 mg by mouth every 8 (eight) hours as needed for nausea or vomiting.    . Oxycodone HCl 10 MG TABS Take 1 tablet (10 mg total) by mouth 3 (three) times daily as needed (pain). 15 tablet 0  . predniSONE (DELTASONE) 20 MG tablet Take 2 tablets (40 mg total) by mouth daily with breakfast. For the next four days 8 tablet 0  . [DISCONTINUED] insulin lispro (HUMALOG) 100 UNIT/ML injection Inject 60 Units into the skin 2 (two) times daily.       No current facility-administered  medications for this visit.       PHYSICAL EXAM: Filed Vitals:   04/27/16 1357  BP: 100/79  Pulse: 85  Temp: 98 F (36.7 C)  Resp: 16  Height: _0  (1.651 m)  Weight: 262 lb (118.842 kg)  SpO2: 95%    GENERAL: The patient is a well-nourished female, in no acute distress. The vital signs are documented above. Good thrill with good size maturation at the wrist on the left radiocephalic fistula. She does have a good thrill in her forearm but the vein does appear to run quite deep. I imaged this with SonoSite ultrasound that she does have a very nice caliber vein but this does run nearly a centimeter under the skin  Formal duplex of this shows the same with diameter and the 6 mm range but does run deep  MEDICAL ISSUES: Good size maturation of her left radiocephalic fistula. Have recommended superficial mobilization of her vein as an outpatient. She has hemodialysis on Tuesday Thursday and Saturday. Will schedule this next week for one of my partners as an outpatient.  Curt Jews [facility] [place]: [phone]

## 2016-05-10 NOTE — Op Note (Signed)
    OPERATIVE REPORT  DATE OF SURGERY: 05/10/2016  PATIENT: [name], 50 y.o. female MRN: [id number]  DOB: [birth year]  PRE-OPERATIVE DIAGNOSIS: End-stage renal disease with poorly functioning left radiocephalic fistula related to deep location of cephalic vein  POST-OPERATIVE DIAGNOSIS:  Same  PROCEDURE: Superficial mobilization of left cephalic vein  SURGEON:  Curt Jews, M.D.  PHYSICIAN ASSISTANT: Pervis Hocking [facility]  ANESTHESIA:  Local with sedation  EBL: Minimal ml  Total I/O In: 400 [I.V.:400] Out: 25 [Blood:25]  BLOOD ADMINISTERED: None  DRAINS: None  SPECIMEN: None  COUNTS CORRECT:  YES  PLAN OF CARE: PACU   PATIENT DISPOSITION:  PACU - hemodynamically stable  PROCEDURE DETAILS: The patient was taken to the operating room placed supine position where the area of the left arm was prepped and draped in usual sterile fashion. Using SonoSite ultrasound the course of the radiocephalic fistula was marked from the wrist to the antecubital space. Local anesthesia was used and 2 incisions were made 1 near the antecubital space and one near the distal forearm. Subcutaneous fat was mobilized from below the skin incisions and also in the skin bridge between the 2 incisions. Approximately 2 cm of distance of fat overlying the cephalic vein was removed. The fascia overlying the cephalic vein was also removed. There were several tributary branches and these were ligated with 3-0 silk ties and divided. The vein size itself was excellent. The wounds were irrigated with saline. Wounds were closed with 3-0 Vicryl in the subcuticular tissue only. There was an excellent thrill. Were with benzoin and Steri-Strips followed by Kerlix and an Ace wrap. The patient was transferred to the recovery room in stable condition   Curt Jews, M.D. 05/10/2016 2:51 PM

## 2016-05-11 ENCOUNTER — Encounter (HOSPITAL_COMMUNITY): Payer: Self-pay | Admitting: Vascular Surgery

## 2016-05-11 ENCOUNTER — Telehealth: Payer: Self-pay | Admitting: Vascular Surgery

## 2016-05-11 DIAGNOSIS — N2581 Secondary hyperparathyroidism of renal origin: Secondary | ICD-10-CM | POA: Diagnosis not present

## 2016-05-11 DIAGNOSIS — N186 End stage renal disease: Secondary | ICD-10-CM | POA: Diagnosis not present

## 2016-05-11 DIAGNOSIS — Z992 Dependence on renal dialysis: Secondary | ICD-10-CM | POA: Diagnosis not present

## 2016-05-11 DIAGNOSIS — D509 Iron deficiency anemia, unspecified: Secondary | ICD-10-CM | POA: Diagnosis not present

## 2016-05-11 NOTE — Telephone Encounter (Signed)
Sched appt 6/13 at 1:00. Spoke to pt to inform them of appt.

## 2016-05-11 NOTE — Telephone Encounter (Signed)
-----  Message from Mena Goes, RN sent at 05/10/2016  3:14 PM EDT ----- Regarding: schedule   ----- Message -----    From: Alvia Grove, PA-C    Sent: 05/10/2016   2:31 PM      To: [facility] Charge Pool  S/p superficialization left forearm arm AVF 05/10/16  F/u in 3-4 weeks with Dr. Donnetta Hutching. No duplex.  Thanks Maudie Mercury

## 2016-05-14 DIAGNOSIS — N186 End stage renal disease: Secondary | ICD-10-CM | POA: Diagnosis not present

## 2016-05-14 DIAGNOSIS — D509 Iron deficiency anemia, unspecified: Secondary | ICD-10-CM | POA: Diagnosis not present

## 2016-05-14 DIAGNOSIS — N2581 Secondary hyperparathyroidism of renal origin: Secondary | ICD-10-CM | POA: Diagnosis not present

## 2016-05-14 DIAGNOSIS — Z992 Dependence on renal dialysis: Secondary | ICD-10-CM | POA: Diagnosis not present

## 2016-05-15 DIAGNOSIS — D509 Iron deficiency anemia, unspecified: Secondary | ICD-10-CM | POA: Diagnosis not present

## 2016-05-15 DIAGNOSIS — Z992 Dependence on renal dialysis: Secondary | ICD-10-CM | POA: Diagnosis not present

## 2016-05-15 DIAGNOSIS — N2581 Secondary hyperparathyroidism of renal origin: Secondary | ICD-10-CM | POA: Diagnosis not present

## 2016-05-15 DIAGNOSIS — N186 End stage renal disease: Secondary | ICD-10-CM | POA: Diagnosis not present

## 2016-05-18 DIAGNOSIS — Z992 Dependence on renal dialysis: Secondary | ICD-10-CM | POA: Diagnosis not present

## 2016-05-18 DIAGNOSIS — N186 End stage renal disease: Secondary | ICD-10-CM | POA: Diagnosis not present

## 2016-05-18 DIAGNOSIS — D509 Iron deficiency anemia, unspecified: Secondary | ICD-10-CM | POA: Diagnosis not present

## 2016-05-18 DIAGNOSIS — N2581 Secondary hyperparathyroidism of renal origin: Secondary | ICD-10-CM | POA: Diagnosis not present

## 2016-05-19 DIAGNOSIS — N186 End stage renal disease: Secondary | ICD-10-CM | POA: Diagnosis not present

## 2016-05-19 DIAGNOSIS — Z992 Dependence on renal dialysis: Secondary | ICD-10-CM | POA: Diagnosis not present

## 2016-05-20 ENCOUNTER — Encounter: Payer: Self-pay | Admitting: Family

## 2016-05-20 ENCOUNTER — Ambulatory Visit (INDEPENDENT_AMBULATORY_CARE_PROVIDER_SITE_OTHER): Payer: Medicare Other | Admitting: Family

## 2016-05-20 ENCOUNTER — Ambulatory Visit (HOSPITAL_COMMUNITY)
Admission: RE | Admit: 2016-05-20 | Discharge: 2016-05-20 | Disposition: A | Discharge status: Home / Self Care | Payer: Medicare Other | Source: Ambulatory Visit | Attending: Vascular Surgery | Admitting: Vascular Surgery

## 2016-05-20 ENCOUNTER — Other Ambulatory Visit: Payer: Self-pay

## 2016-05-20 VITALS — BP 107/69 | HR 91 | Temp 98.3°F | Resp 16

## 2016-05-20 DIAGNOSIS — M79632 Pain in left forearm: Secondary | ICD-10-CM | POA: Insufficient documentation

## 2016-05-20 DIAGNOSIS — N186 End stage renal disease: Secondary | ICD-10-CM | POA: Diagnosis not present

## 2016-05-20 DIAGNOSIS — I509 Heart failure, unspecified: Secondary | ICD-10-CM | POA: Insufficient documentation

## 2016-05-20 DIAGNOSIS — F419 Anxiety disorder, unspecified: Secondary | ICD-10-CM | POA: Insufficient documentation

## 2016-05-20 DIAGNOSIS — Z992 Dependence on renal dialysis: Secondary | ICD-10-CM | POA: Diagnosis not present

## 2016-05-20 DIAGNOSIS — E7800 Pure hypercholesterolemia, unspecified: Secondary | ICD-10-CM | POA: Insufficient documentation

## 2016-05-20 DIAGNOSIS — E119 Type 2 diabetes mellitus without complications: Secondary | ICD-10-CM | POA: Diagnosis not present

## 2016-05-20 DIAGNOSIS — I2510 Atherosclerotic heart disease of native coronary artery without angina pectoris: Secondary | ICD-10-CM | POA: Diagnosis not present

## 2016-05-20 DIAGNOSIS — B9561 Methicillin susceptible Staphylococcus aureus infection as the cause of diseases classified elsewhere: Secondary | ICD-10-CM | POA: Diagnosis not present

## 2016-05-20 DIAGNOSIS — R230 Cyanosis: Secondary | ICD-10-CM | POA: Diagnosis not present

## 2016-05-20 DIAGNOSIS — R208 Other disturbances of skin sensation: Secondary | ICD-10-CM | POA: Insufficient documentation

## 2016-05-20 DIAGNOSIS — I110 Hypertensive heart disease with heart failure: Secondary | ICD-10-CM | POA: Insufficient documentation

## 2016-05-20 DIAGNOSIS — D509 Iron deficiency anemia, unspecified: Secondary | ICD-10-CM | POA: Diagnosis not present

## 2016-05-20 DIAGNOSIS — N2581 Secondary hyperparathyroidism of renal origin: Secondary | ICD-10-CM | POA: Diagnosis not present

## 2016-05-20 DIAGNOSIS — R200 Anesthesia of skin: Secondary | ICD-10-CM

## 2016-05-20 DIAGNOSIS — D631 Anemia in chronic kidney disease: Secondary | ICD-10-CM | POA: Diagnosis not present

## 2016-05-20 DIAGNOSIS — T82848A Pain from vascular prosthetic devices, implants and grafts, initial encounter: Secondary | ICD-10-CM

## 2016-05-20 DIAGNOSIS — R7881 Bacteremia: Secondary | ICD-10-CM | POA: Diagnosis not present

## 2016-05-20 NOTE — Progress Notes (Signed)
    Postoperative Access Visit   History of Present Illness  [name] is a 50 y.o. year old female patient of Dr. Donnetta Hutching and Dr. Trula Slade who is s/p superficial mobilization of left cephalic vein on 3/32/95 for End-stage renal disease. She is also s/p creation of left radiocephalic AV fistula on 1/88/41. She dialyzes T-TH-SAT via right tunneled HD catheter.  She returns today with c/o swelling and pain in her left hand/forearm that she first noticed this morning, no swelling or pain until this morning. She denies laying on her left side. Pt states the swelling has decreased some as this day has progressed, but the tenderness along the superficialization route on her left forearm remains. Her left hand has felt cold since the creation of the fistula on 03/15/16.  She is now taking doxycycline 100 mg po bid for the last 4 days for cellulitis in her right lower leg which she states has improved. She lost 152 pounds from 11/17/15 until now; pt states HD pulled a lot of fluid off, and she changed her diet drastically. She no longer needs any DM medications. She has COPD, states she never smoked, but was exposed to secondhand smoke. She is using supplemental oxygen now via nasal canula.  She was not able to walk much due to lymphedema and cellulitis several times in both lower legs. She is now receiving physical therapy and is able to walk some with a walker.  Pt has a follow up appointment scheduled 06/01/16 with Dr. Donnetta Hutching.  The patient's left forearm incisions are healed.  The patient is able to complete their activities of daily living.    For VQI Use Only  PRE-ADM LIVING: Home  AMB STATUS: Wheelchair  Physical Examination Filed Vitals:   05/20/16 1532  BP: 107/69  Pulse: 91  Temp: 98.3 F (36.8 C)  Resp: 16  SpO2: 99%   There is no weight on file to calculate BMI.  Left UE: Incisions are healed, skin feels normal and warm, left hand grip is 4/5, sensation in digits is  intact, palpable thrill, bruit can be auscultated. Left radial pulse is 2+ palpable. Fingertips of left hand are warm with no cyanosis. Left forearm and hand has no erythema, no drainage, and minimal swelling.  Left upper extremity arterial duplex today indicates perfusion to all 5 fingers is WNL and without compression of the radio cephalic fistula.   Medical Decision Making  [name] is a 50 y.o. year old female who presents s/p superficial mobilization of left cephalic vein on 6/60/63 for End-stage renal disease. She is also s/p creation of left radiocephalic AV fistula on 0/16/01. She dialyzes T-TH-SAT via right tunneled HD catheter. She has good arterial perfusion to all fingertips of left hand, 2+ palpable left radial pulse, no signs of infection, minimal swelling of left UE. Follow up with Dr. Donnetta Hutching as already scheduled on 06/01/16; sooner for more concerns re AV fistula.    Han Vejar, Sharmon Leyden, RN, MSN, FNP-C [facility] [place] Office: [phone]  05/20/2016, 7:04 PM  Clinic MD: Oneida Alar

## 2016-05-21 DIAGNOSIS — I890 Lymphedema, not elsewhere classified: Secondary | ICD-10-CM | POA: Diagnosis not present

## 2016-05-21 DIAGNOSIS — N186 End stage renal disease: Secondary | ICD-10-CM | POA: Diagnosis not present

## 2016-05-21 DIAGNOSIS — R531 Weakness: Secondary | ICD-10-CM | POA: Diagnosis not present

## 2016-05-21 DIAGNOSIS — I5032 Chronic diastolic (congestive) heart failure: Secondary | ICD-10-CM | POA: Diagnosis not present

## 2016-05-21 DIAGNOSIS — E1122 Type 2 diabetes mellitus with diabetic chronic kidney disease: Secondary | ICD-10-CM | POA: Diagnosis not present

## 2016-05-21 DIAGNOSIS — I132 Hypertensive heart and chronic kidney disease with heart failure and with stage 5 chronic kidney disease, or end stage renal disease: Secondary | ICD-10-CM | POA: Diagnosis not present

## 2016-05-22 DIAGNOSIS — B9561 Methicillin susceptible Staphylococcus aureus infection as the cause of diseases classified elsewhere: Secondary | ICD-10-CM | POA: Diagnosis not present

## 2016-05-22 DIAGNOSIS — N2581 Secondary hyperparathyroidism of renal origin: Secondary | ICD-10-CM | POA: Diagnosis not present

## 2016-05-22 DIAGNOSIS — D631 Anemia in chronic kidney disease: Secondary | ICD-10-CM | POA: Diagnosis not present

## 2016-05-22 DIAGNOSIS — R7881 Bacteremia: Secondary | ICD-10-CM | POA: Diagnosis not present

## 2016-05-22 DIAGNOSIS — N186 End stage renal disease: Secondary | ICD-10-CM | POA: Diagnosis not present

## 2016-05-22 DIAGNOSIS — D509 Iron deficiency anemia, unspecified: Secondary | ICD-10-CM | POA: Diagnosis not present

## 2016-05-25 DIAGNOSIS — I5032 Chronic diastolic (congestive) heart failure: Secondary | ICD-10-CM | POA: Diagnosis not present

## 2016-05-25 DIAGNOSIS — I890 Lymphedema, not elsewhere classified: Secondary | ICD-10-CM | POA: Diagnosis not present

## 2016-05-25 DIAGNOSIS — R531 Weakness: Secondary | ICD-10-CM | POA: Diagnosis not present

## 2016-05-25 DIAGNOSIS — E1122 Type 2 diabetes mellitus with diabetic chronic kidney disease: Secondary | ICD-10-CM | POA: Diagnosis not present

## 2016-05-25 DIAGNOSIS — D631 Anemia in chronic kidney disease: Secondary | ICD-10-CM | POA: Diagnosis not present

## 2016-05-25 DIAGNOSIS — R7881 Bacteremia: Secondary | ICD-10-CM | POA: Diagnosis not present

## 2016-05-25 DIAGNOSIS — B9561 Methicillin susceptible Staphylococcus aureus infection as the cause of diseases classified elsewhere: Secondary | ICD-10-CM | POA: Diagnosis not present

## 2016-05-25 DIAGNOSIS — N186 End stage renal disease: Secondary | ICD-10-CM | POA: Diagnosis not present

## 2016-05-25 DIAGNOSIS — D509 Iron deficiency anemia, unspecified: Secondary | ICD-10-CM | POA: Diagnosis not present

## 2016-05-25 DIAGNOSIS — N2581 Secondary hyperparathyroidism of renal origin: Secondary | ICD-10-CM | POA: Diagnosis not present

## 2016-05-25 DIAGNOSIS — I132 Hypertensive heart and chronic kidney disease with heart failure and with stage 5 chronic kidney disease, or end stage renal disease: Secondary | ICD-10-CM | POA: Diagnosis not present

## 2016-05-26 DIAGNOSIS — I132 Hypertensive heart and chronic kidney disease with heart failure and with stage 5 chronic kidney disease, or end stage renal disease: Secondary | ICD-10-CM | POA: Diagnosis not present

## 2016-05-26 DIAGNOSIS — R531 Weakness: Secondary | ICD-10-CM | POA: Diagnosis not present

## 2016-05-26 DIAGNOSIS — N186 End stage renal disease: Secondary | ICD-10-CM | POA: Diagnosis not present

## 2016-05-26 DIAGNOSIS — E1122 Type 2 diabetes mellitus with diabetic chronic kidney disease: Secondary | ICD-10-CM | POA: Diagnosis not present

## 2016-05-26 DIAGNOSIS — I5032 Chronic diastolic (congestive) heart failure: Secondary | ICD-10-CM | POA: Diagnosis not present

## 2016-05-26 DIAGNOSIS — I890 Lymphedema, not elsewhere classified: Secondary | ICD-10-CM | POA: Diagnosis not present

## 2016-05-27 ENCOUNTER — Encounter: Payer: Self-pay | Admitting: Vascular Surgery

## 2016-05-27 DIAGNOSIS — N2581 Secondary hyperparathyroidism of renal origin: Secondary | ICD-10-CM | POA: Diagnosis not present

## 2016-05-27 DIAGNOSIS — R7881 Bacteremia: Secondary | ICD-10-CM | POA: Diagnosis not present

## 2016-05-27 DIAGNOSIS — D509 Iron deficiency anemia, unspecified: Secondary | ICD-10-CM | POA: Diagnosis not present

## 2016-05-27 DIAGNOSIS — D631 Anemia in chronic kidney disease: Secondary | ICD-10-CM | POA: Diagnosis not present

## 2016-05-27 DIAGNOSIS — N186 End stage renal disease: Secondary | ICD-10-CM | POA: Diagnosis not present

## 2016-05-27 DIAGNOSIS — B9561 Methicillin susceptible Staphylococcus aureus infection as the cause of diseases classified elsewhere: Secondary | ICD-10-CM | POA: Diagnosis not present

## 2016-05-29 DIAGNOSIS — D631 Anemia in chronic kidney disease: Secondary | ICD-10-CM | POA: Diagnosis not present

## 2016-05-29 DIAGNOSIS — B9561 Methicillin susceptible Staphylococcus aureus infection as the cause of diseases classified elsewhere: Secondary | ICD-10-CM | POA: Diagnosis not present

## 2016-05-29 DIAGNOSIS — R7881 Bacteremia: Secondary | ICD-10-CM | POA: Diagnosis not present

## 2016-05-29 DIAGNOSIS — D509 Iron deficiency anemia, unspecified: Secondary | ICD-10-CM | POA: Diagnosis not present

## 2016-05-29 DIAGNOSIS — N186 End stage renal disease: Secondary | ICD-10-CM | POA: Diagnosis not present

## 2016-05-29 DIAGNOSIS — N2581 Secondary hyperparathyroidism of renal origin: Secondary | ICD-10-CM | POA: Diagnosis not present

## 2016-05-31 DIAGNOSIS — R7881 Bacteremia: Secondary | ICD-10-CM | POA: Diagnosis not present

## 2016-05-31 DIAGNOSIS — D509 Iron deficiency anemia, unspecified: Secondary | ICD-10-CM | POA: Diagnosis not present

## 2016-05-31 DIAGNOSIS — D631 Anemia in chronic kidney disease: Secondary | ICD-10-CM | POA: Diagnosis not present

## 2016-05-31 DIAGNOSIS — N186 End stage renal disease: Secondary | ICD-10-CM | POA: Diagnosis not present

## 2016-05-31 DIAGNOSIS — Z992 Dependence on renal dialysis: Secondary | ICD-10-CM | POA: Diagnosis not present

## 2016-05-31 DIAGNOSIS — B9561 Methicillin susceptible Staphylococcus aureus infection as the cause of diseases classified elsewhere: Secondary | ICD-10-CM | POA: Diagnosis not present

## 2016-05-31 DIAGNOSIS — N2581 Secondary hyperparathyroidism of renal origin: Secondary | ICD-10-CM | POA: Diagnosis not present

## 2016-06-01 ENCOUNTER — Encounter: Payer: Self-pay | Admitting: Vascular Surgery

## 2016-06-01 ENCOUNTER — Ambulatory Visit (INDEPENDENT_AMBULATORY_CARE_PROVIDER_SITE_OTHER): Payer: Medicare Other | Admitting: Vascular Surgery

## 2016-06-01 VITALS — BP 104/69 | HR 71 | Temp 97.3°F | Resp 18 | Ht 65.0 in | Wt 269.0 lb

## 2016-06-01 DIAGNOSIS — N186 End stage renal disease: Secondary | ICD-10-CM

## 2016-06-01 DIAGNOSIS — Z992 Dependence on renal dialysis: Secondary | ICD-10-CM

## 2016-06-01 NOTE — Progress Notes (Signed)
POST OPERATIVE OFFICE NOTE    CC:  F/u for surgery  HPI:  This is a 50 y.o. female who is s/p Superficialization left radiocephalic fistula.  She did have symptoms of steal and had an arterial UE study that showed well perfused left UE.  Her symptoms are gone.  She reports no fever, chills, numbness or motor loss in her UE.    Allergies  Allergen Reactions  . Contrast Media [Iodinated Diagnostic Agents] Anaphylaxis, Hives and Swelling    Dye for cardiac cath. Tongue swells  . Pineapple Anaphylaxis, Itching and Swelling  . Pneumococcal Vaccines Swelling    Turns skin black, and bodily swelling  . Shellfish Allergy Itching and Swelling    Facial swelling   . Vancomycin Nausea And Vomiting    Infusion "made me feel like I was dying" had to be readmitted to hospital    Current Outpatient Prescriptions  Medication Sig Dispense Refill  . acetaminophen (TYLENOL) 325 MG tablet Take 650 mg by mouth every 6 (six) hours as needed for moderate pain or fever.     Marland Kitchen albuterol (PROVENTIL) (2.5 MG/3ML) 0.083% nebulizer solution Take 2.5 mg by nebulization every 6 (six) hours as needed for wheezing or shortness of breath.    . ALPRAZolam (XANAX) 0.5 MG tablet Take 0.5 mg by mouth 3 (three) times daily as needed for anxiety or sleep.     Marland Kitchen amLODipine (NORVASC) 10 MG tablet Take 10 mg by mouth daily.    Marland Kitchen b complex-vitamin c-folic acid (NEPHRO-VITE) 0.8 MG TABS tablet Take 1 tablet by mouth 3 (three) times daily.     . diphenhydrAMINE (BENADRYL) 25 mg capsule Take 25 mg by mouth every 6 (six) hours as needed for allergies.    Marland Kitchen insulin NPH-insulin regular (NOVOLIN 70/30) (70-30) 100 UNIT/ML injection Inject 60 Units into the skin 2 (two) times daily. Reported on 03/12/2016    . metoprolol (LOPRESSOR) 50 MG tablet Take 0.5 tablets (25 mg total) by mouth 2 (two) times daily. 30 tablet 12  . ondansetron (ZOFRAN-ODT) 4 MG disintegrating tablet Take 4 mg by mouth every 8 (eight) hours as needed for nausea or  vomiting.    . Oxycodone HCl 10 MG TABS Take 1 tablet (10 mg total) by mouth 3 (three) times daily as needed (pain). 15 tablet 0  . promethazine (PHENERGAN) 12.5 MG tablet Take 12.5 mg by mouth every 6 (six) hours as needed for nausea or vomiting (alternates with Zofran).    . Sucroferric Oxyhydroxide (VELPHORO PO) Take 500 mg by mouth. Takes 2 tablets with each meal and one tablet with each snack.    . [DISCONTINUED] insulin lispro (HUMALOG) 100 UNIT/ML injection Inject 60 Units into the skin 2 (two) times daily.       No current facility-administered medications for this visit.     ROS:  See HPI  Physical Exam:  Filed Vitals:   06/01/16 1313 06/01/16 1316  BP:  104/69  Pulse:  71  Temp:  97.3 F (36.3 C)  Resp: 18 18    Incision:  Well healed Extremities:  Bilateral UE Neuro vascular and motor intact equal bil.  Palpable thrill through out the fistula.   Assessment/Plan:  This is a 50 y.o. female who is s/p: Superficial mobilization of left cephalic vein  The fistula may be used on or after 06/10/2016.  F/U with Dr. Donnetta Hutching PRN.     Theda Sers, EMMA MAUREEN PA-C [facility]   Clinic MD:  Pt seen  and examined with Dr. Donnetta Hutching I have examined the patient, reviewed and agree with above.Does have very nice maturation. Image this vein with SonoSite and looks like has good caliber throughout. Feel that she has a very high chance that this will be successful dialysis. Will wait a total of one month out from her superficial mobilization which would be 06/10/2016 and would begin using her fistula that time  Curt Jews, MD 06/01/2016 1:57 PM

## 2016-06-03 DIAGNOSIS — D509 Iron deficiency anemia, unspecified: Secondary | ICD-10-CM | POA: Diagnosis not present

## 2016-06-03 DIAGNOSIS — B9561 Methicillin susceptible Staphylococcus aureus infection as the cause of diseases classified elsewhere: Secondary | ICD-10-CM | POA: Diagnosis not present

## 2016-06-03 DIAGNOSIS — R7881 Bacteremia: Secondary | ICD-10-CM | POA: Diagnosis not present

## 2016-06-03 DIAGNOSIS — N186 End stage renal disease: Secondary | ICD-10-CM | POA: Diagnosis not present

## 2016-06-03 DIAGNOSIS — N2581 Secondary hyperparathyroidism of renal origin: Secondary | ICD-10-CM | POA: Diagnosis not present

## 2016-06-03 DIAGNOSIS — D631 Anemia in chronic kidney disease: Secondary | ICD-10-CM | POA: Diagnosis not present

## 2016-06-05 DIAGNOSIS — D509 Iron deficiency anemia, unspecified: Secondary | ICD-10-CM | POA: Diagnosis not present

## 2016-06-05 DIAGNOSIS — R7881 Bacteremia: Secondary | ICD-10-CM | POA: Diagnosis not present

## 2016-06-05 DIAGNOSIS — D631 Anemia in chronic kidney disease: Secondary | ICD-10-CM | POA: Diagnosis not present

## 2016-06-05 DIAGNOSIS — N2581 Secondary hyperparathyroidism of renal origin: Secondary | ICD-10-CM | POA: Diagnosis not present

## 2016-06-05 DIAGNOSIS — B9561 Methicillin susceptible Staphylococcus aureus infection as the cause of diseases classified elsewhere: Secondary | ICD-10-CM | POA: Diagnosis not present

## 2016-06-05 DIAGNOSIS — N186 End stage renal disease: Secondary | ICD-10-CM | POA: Diagnosis not present

## 2016-06-06 ENCOUNTER — Emergency Department (HOSPITAL_COMMUNITY): Payer: Medicare Other

## 2016-06-06 ENCOUNTER — Encounter (HOSPITAL_COMMUNITY): Payer: Self-pay | Admitting: Emergency Medicine

## 2016-06-06 ENCOUNTER — Other Ambulatory Visit (HOSPITAL_COMMUNITY): Payer: Self-pay

## 2016-06-06 ENCOUNTER — Inpatient Hospital Stay (HOSPITAL_COMMUNITY)
Admission: EM | Admit: 2016-06-06 | Discharge: 2016-06-11 | DRG: 689 | Disposition: A | Discharge status: Home Health | Payer: Medicare Other | Attending: Pulmonary Disease | Admitting: Pulmonary Disease

## 2016-06-06 ENCOUNTER — Other Ambulatory Visit: Payer: Self-pay

## 2016-06-06 DIAGNOSIS — E119 Type 2 diabetes mellitus without complications: Secondary | ICD-10-CM

## 2016-06-06 DIAGNOSIS — Z85850 Personal history of malignant neoplasm of thyroid: Secondary | ICD-10-CM | POA: Diagnosis not present

## 2016-06-06 DIAGNOSIS — J45909 Unspecified asthma, uncomplicated: Secondary | ICD-10-CM | POA: Diagnosis present

## 2016-06-06 DIAGNOSIS — Z992 Dependence on renal dialysis: Secondary | ICD-10-CM | POA: Diagnosis not present

## 2016-06-06 DIAGNOSIS — G4730 Sleep apnea, unspecified: Secondary | ICD-10-CM | POA: Diagnosis present

## 2016-06-06 DIAGNOSIS — E1122 Type 2 diabetes mellitus with diabetic chronic kidney disease: Secondary | ICD-10-CM | POA: Diagnosis present

## 2016-06-06 DIAGNOSIS — D649 Anemia, unspecified: Secondary | ICD-10-CM | POA: Diagnosis present

## 2016-06-06 DIAGNOSIS — E7800 Pure hypercholesterolemia, unspecified: Secondary | ICD-10-CM | POA: Diagnosis present

## 2016-06-06 DIAGNOSIS — E662 Morbid (severe) obesity with alveolar hypoventilation: Secondary | ICD-10-CM | POA: Diagnosis present

## 2016-06-06 DIAGNOSIS — M1990 Unspecified osteoarthritis, unspecified site: Secondary | ICD-10-CM | POA: Diagnosis present

## 2016-06-06 DIAGNOSIS — Z794 Long term (current) use of insulin: Secondary | ICD-10-CM | POA: Diagnosis not present

## 2016-06-06 DIAGNOSIS — I10 Essential (primary) hypertension: Secondary | ICD-10-CM | POA: Diagnosis not present

## 2016-06-06 DIAGNOSIS — I2510 Atherosclerotic heart disease of native coronary artery without angina pectoris: Secondary | ICD-10-CM | POA: Diagnosis present

## 2016-06-06 DIAGNOSIS — R109 Unspecified abdominal pain: Secondary | ICD-10-CM | POA: Diagnosis not present

## 2016-06-06 DIAGNOSIS — N390 Urinary tract infection, site not specified: Principal | ICD-10-CM | POA: Diagnosis present

## 2016-06-06 DIAGNOSIS — K829 Disease of gallbladder, unspecified: Secondary | ICD-10-CM | POA: Diagnosis not present

## 2016-06-06 DIAGNOSIS — K811 Chronic cholecystitis: Secondary | ICD-10-CM | POA: Diagnosis present

## 2016-06-06 DIAGNOSIS — R509 Fever, unspecified: Secondary | ICD-10-CM | POA: Diagnosis present

## 2016-06-06 DIAGNOSIS — I132 Hypertensive heart and chronic kidney disease with heart failure and with stage 5 chronic kidney disease, or end stage renal disease: Secondary | ICD-10-CM | POA: Diagnosis present

## 2016-06-06 DIAGNOSIS — I959 Hypotension, unspecified: Secondary | ICD-10-CM | POA: Diagnosis present

## 2016-06-06 DIAGNOSIS — J810 Acute pulmonary edema: Secondary | ICD-10-CM | POA: Diagnosis not present

## 2016-06-06 DIAGNOSIS — F419 Anxiety disorder, unspecified: Secondary | ICD-10-CM | POA: Diagnosis present

## 2016-06-06 DIAGNOSIS — E871 Hypo-osmolality and hyponatremia: Secondary | ICD-10-CM | POA: Diagnosis present

## 2016-06-06 DIAGNOSIS — R7881 Bacteremia: Secondary | ICD-10-CM

## 2016-06-06 DIAGNOSIS — N184 Chronic kidney disease, stage 4 (severe): Secondary | ICD-10-CM

## 2016-06-06 DIAGNOSIS — Z9981 Dependence on supplemental oxygen: Secondary | ICD-10-CM

## 2016-06-06 DIAGNOSIS — B957 Other staphylococcus as the cause of diseases classified elsewhere: Secondary | ICD-10-CM

## 2016-06-06 DIAGNOSIS — K8020 Calculus of gallbladder without cholecystitis without obstruction: Secondary | ICD-10-CM

## 2016-06-06 DIAGNOSIS — M797 Fibromyalgia: Secondary | ICD-10-CM | POA: Diagnosis present

## 2016-06-06 DIAGNOSIS — Z79899 Other long term (current) drug therapy: Secondary | ICD-10-CM | POA: Diagnosis not present

## 2016-06-06 DIAGNOSIS — N186 End stage renal disease: Secondary | ICD-10-CM | POA: Diagnosis present

## 2016-06-06 DIAGNOSIS — I890 Lymphedema, not elsewhere classified: Secondary | ICD-10-CM

## 2016-06-06 DIAGNOSIS — O864 Pyrexia of unknown origin following delivery: Secondary | ICD-10-CM

## 2016-06-06 DIAGNOSIS — G8929 Other chronic pain: Secondary | ICD-10-CM | POA: Diagnosis present

## 2016-06-06 DIAGNOSIS — I509 Heart failure, unspecified: Secondary | ICD-10-CM | POA: Diagnosis present

## 2016-06-06 DIAGNOSIS — Z6841 Body Mass Index (BMI) 40.0 and over, adult: Secondary | ICD-10-CM | POA: Diagnosis not present

## 2016-06-06 DIAGNOSIS — E1129 Type 2 diabetes mellitus with other diabetic kidney complication: Secondary | ICD-10-CM | POA: Diagnosis not present

## 2016-06-06 DIAGNOSIS — J9611 Chronic respiratory failure with hypoxia: Secondary | ICD-10-CM | POA: Diagnosis present

## 2016-06-06 DIAGNOSIS — K5730 Diverticulosis of large intestine without perforation or abscess without bleeding: Secondary | ICD-10-CM | POA: Diagnosis not present

## 2016-06-06 DIAGNOSIS — R0602 Shortness of breath: Secondary | ICD-10-CM | POA: Diagnosis not present

## 2016-06-06 LAB — CBC WITH DIFFERENTIAL/PLATELET
BASOS ABS: 0 10*3/uL (ref 0.0–0.1)
BASOS PCT: 0 %
Eosinophils Absolute: 0.1 10*3/uL (ref 0.0–0.7)
Eosinophils Relative: 1 %
HEMATOCRIT: 39.9 % (ref 36.0–46.0)
Hemoglobin: 13.1 g/dL (ref 12.0–15.0)
Lymphocytes Relative: 5 %
Lymphs Abs: 1.1 10*3/uL (ref 0.7–4.0)
MCH: 30.6 pg (ref 26.0–34.0)
MCHC: 32.8 g/dL (ref 30.0–36.0)
MCV: 93.2 fL (ref 78.0–100.0)
MONO ABS: 0.7 10*3/uL (ref 0.1–1.0)
Monocytes Relative: 3 %
NEUTROS ABS: 20.7 10*3/uL — AB (ref 1.7–7.7)
NEUTROS PCT: 91 %
Platelets: 302 10*3/uL (ref 150–400)
RBC: 4.28 MIL/uL (ref 3.87–5.11)
RDW: 17.4 % — AB (ref 11.5–15.5)
WBC: 22.6 10*3/uL — AB (ref 4.0–10.5)

## 2016-06-06 LAB — COMPREHENSIVE METABOLIC PANEL
ALK PHOS: 131 U/L — AB (ref 38–126)
ALT: 30 U/L (ref 14–54)
AST: 31 U/L (ref 15–41)
Albumin: 4.1 g/dL (ref 3.5–5.0)
Anion gap: 11 (ref 5–15)
BILIRUBIN TOTAL: 0.7 mg/dL (ref 0.3–1.2)
BUN: 48 mg/dL — ABNORMAL HIGH (ref 6–20)
CO2: 22 mmol/L (ref 22–32)
Calcium: 9.3 mg/dL (ref 8.9–10.3)
Chloride: 98 mmol/L — ABNORMAL LOW (ref 101–111)
Creatinine, Ser: 6.4 mg/dL — ABNORMAL HIGH (ref 0.44–1.00)
GFR, EST AFRICAN AMERICAN: 8 mL/min — AB (ref >60)
GFR, EST NON AFRICAN AMERICAN: 7 mL/min — AB (ref >60)
GLUCOSE: 164 mg/dL — AB (ref 65–99)
POTASSIUM: 4.4 mmol/L (ref 3.5–5.1)
Sodium: 131 mmol/L — ABNORMAL LOW (ref 135–145)
TOTAL PROTEIN: 8.7 g/dL — AB (ref 6.5–8.1)

## 2016-06-06 LAB — URINALYSIS, ROUTINE W REFLEX MICROSCOPIC
Glucose, UA: NEGATIVE mg/dL
Hgb urine dipstick: NEGATIVE
LEUKOCYTES UA: NEGATIVE
NITRITE: NEGATIVE
PROTEIN: 100 mg/dL — AB
Specific Gravity, Urine: >1.03 — ABNORMAL HIGH (ref 1.005–1.030)
pH: 5 (ref 5.0–8.0)

## 2016-06-06 LAB — URINE MICROSCOPIC-ADD ON

## 2016-06-06 LAB — INFLUENZA PANEL BY PCR (TYPE A & B)
H1N1 flu by pcr: NOT DETECTED
INFLAPCR: NEGATIVE
Influenza B By PCR: NEGATIVE

## 2016-06-06 LAB — TROPONIN I: Troponin I: 0.03 ng/mL (ref <0.031)

## 2016-06-06 LAB — LACTIC ACID, PLASMA
LACTIC ACID, VENOUS: 1.6 mmol/L (ref 0.5–2.0)
Lactic Acid, Venous: 1.8 mmol/L (ref 0.5–2.0)

## 2016-06-06 MED ORDER — ACETAMINOPHEN 325 MG PO TABS: 650.0000 mg | ORAL_TABLET | Freq: Once | ORAL | Status: DC | PRN

## 2016-06-06 MED ORDER — ACETAMINOPHEN 325 MG PO TABS
650.0000 mg | ORAL_TABLET | Freq: Once | ORAL | Status: AC | PRN
  Administered 2016-06-06: 650 mg via ORAL
  Filled 2016-06-06: qty 2

## 2016-06-06 MED ORDER — PIPERACILLIN-TAZOBACTAM 3.375 G IVPB 30 MIN
3.3750 g | Freq: Once | INTRAVENOUS | Status: AC
Start: 2016-06-06 — End: 2016-06-06
  Administered 2016-06-06: 3.375 g via INTRAVENOUS
  Filled 2016-06-06: qty 50

## 2016-06-06 MED ORDER — ONDANSETRON HCL 4 MG/2ML IJ SOLN
4.0000 mg | Freq: Once | INTRAMUSCULAR | Status: AC
  Administered 2016-06-06: 4 mg via INTRAVENOUS
  Filled 2016-06-06: qty 2

## 2016-06-06 MED ORDER — SODIUM CHLORIDE 0.9 % IV SOLN
INTRAVENOUS | Status: DC
  Administered 2016-06-06 – 2016-06-07 (×2): via INTRAVENOUS

## 2016-06-06 MED ORDER — HYDROMORPHONE HCL 1 MG/ML IJ SOLN
1.0000 mg | Freq: Once | INTRAMUSCULAR | Status: AC
  Administered 2016-06-06: 1 mg via INTRAVENOUS
  Filled 2016-06-06: qty 1

## 2016-06-06 MED ORDER — PIPERACILLIN-TAZOBACTAM IN DEX 2-0.25 GM/50ML IV SOLN
2.2500 g | Freq: Three times a day (TID) | INTRAVENOUS | Status: DC
  Administered 2016-06-07 (×2): 2.25 g via INTRAVENOUS
  Filled 2016-06-06 (×5): qty 50

## 2016-06-06 MED ORDER — SODIUM CHLORIDE 0.9 % IV SOLN
500.0000 mg | Freq: Once | INTRAVENOUS | Status: AC
  Administered 2016-06-06: 500 mg via INTRAVENOUS
  Filled 2016-06-06: qty 10

## 2016-06-06 MED ORDER — DAPTOMYCIN 500 MG IV SOLR
INTRAVENOUS | Status: AC
  Filled 2016-06-06: qty 10

## 2016-06-06 MED ORDER — ACETAMINOPHEN 325 MG PO TABS
650.0000 mg | ORAL_TABLET | Freq: Once | ORAL | Status: AC
  Administered 2016-06-06: 650 mg via ORAL
  Filled 2016-06-06: qty 2

## 2016-06-06 NOTE — ED Notes (Signed)
Attempted to access port again for second iv site, was able to get blood return but unable to flush saline, pt states that her port has not been flushed in several months,

## 2016-06-06 NOTE — ED Notes (Signed)
AC notified of need for antibiotic,

## 2016-06-06 NOTE — ED Provider Notes (Signed)
CSN: [id number]     Arrival date & time 06/06/16  1626 History   First MD Initiated Contact with Patient 06/06/16 1648     Chief Complaint  Patient presents with  . Fever     (Consider location/radiation/quality/duration/timing/severity/associated sxs/prior Treatment) HPI  50 year old female presents with acute chills and pain. She was at church when she started having chills and felt like she had aches all over. Feels similar to when she's had the flu a couple years ago. Patient is on dialysis. Last dialysis was yesterday. Found to have a fever of 102.4 here. Has been feeling short of breath since the chills started. No cough but is having pain over her left kidney/left flank. She urinates every 3 days but has not noticed dysuria. No abdominal pain. No headaches. Chronically has lymphedema but has not noticed any new swelling or redness. She has a new fistula being made in her left forearm and states that it hurt a couple days ago but has not been hurting as bad since. The chest site does not hurt. No redness in either.  Past Medical History  Diagnosis Date  . Chronic pain   . Lymph edema   . Cellulitis   . Chiari malformation     s/p surgery  . S/P colonoscopy 05/26/2007    Dr. Laural Golden sigmoid diverticulosis random biopsies benign  . Hypercholesterolemia   . Hypertension   . S/P endoscopy 05/01/2009    Dr. Penelope Coop pill-induced esophageal ulcerations distal to midesophagus, 2 small ulcers in the antrum of the stomach  . Fibromyalgia   . On home O2   . Obesity hypoventilation syndrome ([place])   . Hx of echocardiogram 10/2011    EF 55-60%  . CHF (congestive heart failure) ([place])   . Shortness of breath dyspnea     with any exertion or if heart rate  is irregular while on dialysis  . Diabetes mellitus     Type II  . Anxiety   . History of blood transfusion     hemorrage duinrg pregancy  . Sleep apnea   . Asthma     as a child  . Pneumonia     in past  . Renal disorder     Pt  started dialysis in Dec.2016  . Constipation   . Cancer ([place])     thyroid  . Coronary artery disease     40-50% mid LAD 04/29/09, Medical tx. (Dr. Gwenlyn Found)  . Complication of anesthesia 11/28/15    Resp arrest after  conscious  sedation   Past Surgical History  Procedure Laterality Date  . Cesarean section       x 2  . Abdominal hysterectomy    . Tonsillectomy    . Adenoidectomy    . Thyroidectomy, partial    . Craniectomy suboccipital w/ cervical laminectomy / chiari    . Portacath placement  07/05/2012    Procedure: INSERTION PORT-A-CATH;  Surgeon: Donato Heinz, MD;  Location: [facility];  Service: General;  Laterality: Left;  subclavian  . Av fistula placement Left 03/15/2016    Procedure: CREATION OF LEFT ARM ARTERIOVENOUS (AV) FISTULA  ;  Surgeon: Rosetta Posner, MD;  Location: [place];  Service: Vascular;  Laterality: Left;  . .hemodialysis catheter Right 11/28/2015  . Fistula superficialization Left 05/10/2016    Procedure: Left Arm FISTULA SUPERFICIALIZATION;  Surgeon: Rosetta Posner, MD;  Location[facility];  Service: Vascular;  Laterality: Left;   Family History  Problem Relation Age of  Onset  . Colon cancer Mother 9  . Stroke Mother 32  . Coronary artery disease Mother    Social History  Substance Use Topics  . Smoking status: Never Smoker   . Smokeless tobacco: Never Used  . Alcohol Use: No   OB History    Gravida Para Term Preterm AB TAB SAB Ectopic Multiple Living   _0 Review of Systems  Constitutional: Positive for fever and chills.  Respiratory: Positive for shortness of breath. Negative for cough.   Cardiovascular: Positive for leg swelling (chronic). Negative for chest pain.  Gastrointestinal: Negative for vomiting and abdominal pain.  Genitourinary: Positive for flank pain. Negative for dysuria.  Musculoskeletal: Negative for neck stiffness.  Neurological: Negative for headaches.  All other systems reviewed and are negative.     Allergies   Contrast media; Pineapple; Pneumococcal vaccines; Shellfish allergy; and Vancomycin  Home Medications   Prior to Admission medications   Medication Sig Start Date End Date Taking? Authorizing Provider  acetaminophen (TYLENOL) 325 MG tablet Take 650 mg by mouth every 6 (six) hours as needed for moderate pain or fever.     Historical Provider, MD  albuterol (PROVENTIL) (2.5 MG/3ML) 0.083% nebulizer solution Take 2.5 mg by nebulization every 6 (six) hours as needed for wheezing or shortness of breath.    Historical Provider, MD  ALPRAZolam Duanne Moron) 0.5 MG tablet Take 0.5 mg by mouth 3 (three) times daily as needed for anxiety or sleep.     Historical Provider, MD  amLODipine (NORVASC) 10 MG tablet Take 10 mg by mouth daily.    Historical Provider, MD  b complex-vitamin c-folic acid (NEPHRO-VITE) 0.8 MG TABS tablet Take 1 tablet by mouth 3 (three) times daily.     Historical Provider, MD  diphenhydrAMINE (BENADRYL) 25 mg capsule Take 25 mg by mouth every 6 (six) hours as needed for allergies.    Historical Provider, MD  insulin NPH-insulin regular (NOVOLIN 70/30) (70-30) 100 UNIT/ML injection Inject 60 Units into the skin 2 (two) times daily. Reported on 03/12/2016    Historical Provider, MD  metoprolol (LOPRESSOR) 50 MG tablet Take 0.5 tablets (25 mg total) by mouth 2 (two) times daily. 12/04/15   [name], MD  ondansetron (ZOFRAN-ODT) 4 MG disintegrating tablet Take 4 mg by mouth every 8 (eight) hours as needed for nausea or vomiting.    Historical Provider, MD  Oxycodone HCl 10 MG TABS Take 1 tablet (10 mg total) by mouth 3 (three) times daily as needed (pain). 05/10/16   Alvia Grove, PA-C  promethazine (PHENERGAN) 12.5 MG tablet Take 12.5 mg by mouth every 6 (six) hours as needed for nausea or vomiting (alternates with Zofran).    Historical Provider, MD  Sucroferric Oxyhydroxide (VELPHORO PO) Take 500 mg by mouth. Takes 2 tablets with each meal and one tablet with each snack.     Historical Provider, MD   BP 143/68 mmHg  Pulse 94  Temp(Src) 102.4 F (39.1 C) (Oral)  Resp 20  Ht _1  (1.651 m)  Wt 269 lb (122.018 kg)  BMI 44.76 kg/m2  SpO2 99% Physical Exam  Constitutional: She is oriented to person, place, and time. She appears well-developed and well-nourished.  HENT:  Head: Normocephalic and atraumatic.  Right Ear: External ear normal.  Left Ear: External ear normal.  Nose: Nose normal.  Eyes: Right eye exhibits no discharge. Left eye exhibits no discharge.  Cardiovascular: Normal rate,  regular rhythm and normal heart sounds.   Pulmonary/Chest: Effort normal and breath sounds normal.  Right chest dialysis catheter with no tenderness, redness, swelling or pus  Abdominal: Soft. She exhibits no distension. There is no tenderness. There is CVA tenderness (bilateral, left > right).  Musculoskeletal: She exhibits edema (chronically appearing swollen lower extremities bilaterally).  Left forearm fistula with + thrill. NO obvious redness/swellign  Neurological: She is alert and oriented to person, place, and time.  Skin: Skin is warm and dry. No erythema.  Nursing note and vitals reviewed.   ED Course  Procedures (including critical care time) Labs Review Labs Reviewed  COMPREHENSIVE METABOLIC PANEL - Abnormal; Notable for the following:    Sodium 131 (*)    Chloride 98 (*)    Glucose, Bld 164 (*)    BUN 48 (*)    Creatinine, Ser 6.40 (*)    Total Protein 8.7 (*)    Alkaline Phosphatase 131 (*)    GFR calc non Af Amer 7 (*)    GFR calc Af Amer 8 (*)    All other components within normal limits  CBC WITH DIFFERENTIAL/PLATELET - Abnormal; Notable for the following:    WBC 22.6 (*)    RDW 17.4 (*)    Neutro Abs 20.7 (*)    All other components within normal limits  URINALYSIS, ROUTINE W REFLEX MICROSCOPIC (NOT AT [facility]) - Abnormal; Notable for the following:    Specific Gravity, Urine >1.030 (*)    Bilirubin Urine SMALL (*)    Ketones, ur TRACE (*)     Protein, ur 100 (*)    All other components within normal limits  URINE MICROSCOPIC-ADD ON - Abnormal; Notable for the following:    Squamous Epithelial / LPF 0-5 (*)    Bacteria, UA MANY (*)    All other components within normal limits  CULTURE, BLOOD (ROUTINE X 2)  CULTURE, BLOOD (ROUTINE X 2)  LACTIC ACID, PLASMA  LACTIC ACID, PLASMA  INFLUENZA PANEL BY PCR (TYPE A & B, H1N1)    Imaging Review Dg Chest 2 View  06/06/2016  CLINICAL DATA:  Fever, body aches. EXAM: CHEST  2 VIEW COMPARISON:  04/07/2016 FINDINGS: Large bore right internal jugular approach central venous catheter, and left subclavian approach injectable port are stable in position. The cardiac silhouette is enlarged. Mediastinal contours appear intact. There is no evidence of focal airspace consolidation, pleural effusion or pneumothorax. There is mild interstitial pulmonary edema. Osseous structures are without acute abnormality. Soft tissues are grossly normal. IMPRESSION: Enlarged cardiac silhouette with mild interstitial pulmonary edema. Electronically Signed   By: Fidela Salisbury M.D.   On: 06/06/2016 18:16   Ct Chest Wo Contrast  06/06/2016  CLINICAL DATA:  Chills with body aches and generalized abdominal pain. Increasing shortness of breath beginning this afternoon. Dialysis yesterday. EXAM: CT CHEST, ABDOMEN AND PELVIS WITHOUT CONTRAST TECHNIQUE: Multidetector CT imaging of the chest, abdomen and pelvis was performed following the standard protocol without IV contrast. COMPARISON:  CT abdomen/ pelvis 12/08/2015 and 12/02/2014 as well as CT chest 11/06/2011 FINDINGS: CT CHEST Lungs are well inflated without consolidation or effusion. Minimal linear scarring over the lingula. Airways are normal. Left subclavian Port-A-Cath has tip over the SVC. Right IJ dialysis catheter has tip over the right atrium. Mild cardiomegaly. Mediastinal structures otherwise within normal. No hilar or mediastinal adenopathy. CT ABDOMEN AND  PELVIS The liver, spleen, pancreas and adrenal glands are within normal. Gallbladder is slightly contracted as there is a small  focus of air over the nondependent portion of the gallbladder. Subtle hazy attenuation of the adjacent fat as cannot exclude an inflammatory process involving the gallbladder. Kidneys are normal in size without hydronephrosis or nephrolithiasis. Ureters are within normal. Vascular structures are unremarkable. The stomach is within normal. Small bowel is within normal. Appendix is not well visualized. Minimal diverticulosis of the colon. Mesentery is within normal. Small umbilical hernia containing only peritoneal fat unchanged. Pelvic images demonstrate surgical absence of the uterus. The bladder and rectum are within normal. Right ovary is smaller measuring 5 cm. Left ovary not visualized. Remaining bones soft tissues are within normal. IMPRESSION: No acute cardiopulmonary disease. Small focus of air within the nondependent portion of the gallbladder. Gallbladder slightly contracted with hazy attenuation of the adjacent fat as cannot exclude an acute inflammatory or infectious process. Consider right upper quadrant ultrasound for further evaluation. Minimal diverticulosis of the colon. Small umbilical hernia containing only mesenteric fat. Mild stable cardiomegaly. Electronically Signed   By: Marin Olp M.D.   On: 06/06/2016 20:29   Ct Renal Stone Study  06/06/2016  CLINICAL DATA:  Chills with body aches and generalized abdominal pain. Increasing shortness of breath beginning this afternoon. Dialysis yesterday. EXAM: CT CHEST, ABDOMEN AND PELVIS WITHOUT CONTRAST TECHNIQUE: Multidetector CT imaging of the chest, abdomen and pelvis was performed following the standard protocol without IV contrast. COMPARISON:  CT abdomen/ pelvis 12/08/2015 and 12/02/2014 as well as CT chest 11/06/2011 FINDINGS: CT CHEST Lungs are well inflated without consolidation or effusion. Minimal linear scarring  over the lingula. Airways are normal. Left subclavian Port-A-Cath has tip over the SVC. Right IJ dialysis catheter has tip over the right atrium. Mild cardiomegaly. Mediastinal structures otherwise within normal. No hilar or mediastinal adenopathy. CT ABDOMEN AND PELVIS The liver, spleen, pancreas and adrenal glands are within normal. Gallbladder is slightly contracted as there is a small focus of air over the nondependent portion of the gallbladder. Subtle hazy attenuation of the adjacent fat as cannot exclude an inflammatory process involving the gallbladder. Kidneys are normal in size without hydronephrosis or nephrolithiasis. Ureters are within normal. Vascular structures are unremarkable. The stomach is within normal. Small bowel is within normal. Appendix is not well visualized. Minimal diverticulosis of the colon. Mesentery is within normal. Small umbilical hernia containing only peritoneal fat unchanged. Pelvic images demonstrate surgical absence of the uterus. The bladder and rectum are within normal. Right ovary is smaller measuring 5 cm. Left ovary not visualized. Remaining bones soft tissues are within normal. IMPRESSION: No acute cardiopulmonary disease. Small focus of air within the nondependent portion of the gallbladder. Gallbladder slightly contracted with hazy attenuation of the adjacent fat as cannot exclude an acute inflammatory or infectious process. Consider right upper quadrant ultrasound for further evaluation. Minimal diverticulosis of the colon. Small umbilical hernia containing only mesenteric fat. Mild stable cardiomegaly. Electronically Signed   By: Marin Olp M.D.   On: 06/06/2016 20:29   I have personally reviewed and evaluated these images and lab results as part of my medical decision-making.   EKG Interpretation None      MDM   Final diagnoses:  Fever    Patient presents with a fever and unclear source. Could be urinary tract given bacteria and 6-30 WBCs. However  there are no leukocytes and nitrites. This would however explain bilateral flank pain. CT showed no obvious etiology including no ureteral stone or pneumonia. None of her access sites including her chest port, dialysis catheter, and left forearm  fistula appear overtly infected. Given transient swelling and pain of her fistula she'll probably need this evaluated. CT notes some possible gallbladder etiology but she has no tenderness on repeat exam. Will get an ultrasound. Discussed with Dr. Maudie Mercury who will admit to telemetry on observation. Given daptomycin and Zosyn given her vancomycin allergy. Discussed with ID, Dr. Baxter Flattery.    Sherwood Gambler, MD 06/06/16 2103

## 2016-06-06 NOTE — ED Notes (Signed)
Katharine Look RN was able to use port to obtain additional lab work but port stopped flushing, additional RN unable to flush port as well,

## 2016-06-06 NOTE — ED Notes (Signed)
Dr Maudie Mercury aware of vital signs, additional orders given, room assignment changed, bed control notified,

## 2016-06-06 NOTE — ED Notes (Signed)
Pt states she began feeling bad today at church with chills, aches all over, and increasing shortness of breath.  States she had dialysis yesterday and ended an hour early.

## 2016-06-06 NOTE — H&P (Addendum)
TRH H&P   Patient Demographics:    [name], is a 50 y.o. female  MRN: [id number]   DOB - [birth year]  Admit Date - 06/06/2016  Outpatient Primary MD for the patient is [facility], [place], Dr. [name] (pcp)  Referring MD/NP/PA: Sherwood Gambler  Outpatient Specialists: Lowanda Foster (nephrology)  Patient coming from: home  Chief Complaint  Patient presents with  . Fever      HPI:    [name]  is a 50 y.o. female, ESRD on HD (T, T, S), Hypertension, Dm2, apparently c/o fever starting today while at [facility].  Pt went to lunch and couldn't eat and therefore  Presented to ED for evaluation. Pt felt general malaise.    In ED,  Pt noted to be febrile to 103.1, and wbc 22.6. Pt had normal lactic acid.  bp slightly borderline low. uti noted on ua.  + left flank pain.   Pt will be admitted for fever and uti.      Review of systems:    In addition to the HPI above,  No Fever-chills, No Headache, No changes with Vision or hearing, No problems swallowing food or Liquids, No Chest pain, Cough or Shortness of Breath, No Abdominal pain, No Nausea or Vommitting, Bowel movements are regular, No Blood in stool or Urine, No dysuria, No new skin rashes or bruises, No new joints pains-aches,  No new weakness, tingling, numbness in any extremity, No recent weight gain or loss, No polyuria, polydypsia or polyphagia, No significant Mental Stressors.  A full 10 point Review of Systems was done, except as stated above, all other Review of Systems were negative.   With Past History of the following :    Past Medical History  Diagnosis Date  . Chronic pain   . Lymph edema   . Cellulitis   . Chiari malformation     s/p surgery  . S/P colonoscopy 05/26/2007    Dr. Laural Golden sigmoid diverticulosis random biopsies benign  . Hypercholesterolemia   . Hypertension   . S/P  endoscopy 05/01/2009    Dr. Penelope Coop pill-induced esophageal ulcerations distal to midesophagus, 2 small ulcers in the antrum of the stomach  . Fibromyalgia   . On home O2   . Obesity hypoventilation syndrome ([place])   . Hx of echocardiogram 10/2011    EF 55-60%  . CHF (congestive heart failure) ([place])   . Shortness of breath dyspnea     with any exertion or if heart rate  is irregular while on dialysis  . Diabetes mellitus     Type II  . Anxiety   . History of blood transfusion     hemorrage duinrg pregancy  . Sleep apnea   . Asthma     as a child  . Pneumonia     in past  . Renal disorder     Pt started dialysis in  Dec.2016  . Constipation   . Cancer ([place])     thyroid  . Coronary artery disease     40-50% mid LAD 04/29/09, Medical tx. (Dr. Gwenlyn Found)  . Complication of anesthesia 11/28/15    Resp arrest after  conscious  sedation  . Chiari malformation       Past Surgical History  Procedure Laterality Date  . Cesarean section       x 2  . Abdominal hysterectomy    . Tonsillectomy    . Adenoidectomy    . Thyroidectomy, partial    . Craniectomy suboccipital w/ cervical laminectomy / chiari    . Portacath placement  07/05/2012    Procedure: INSERTION PORT-A-CATH;  Surgeon: Donato Heinz, MD;  Location: [facility];  Service: General;  Laterality: Left;  subclavian  . Av fistula placement Left 03/15/2016    Procedure: CREATION OF LEFT ARM ARTERIOVENOUS (AV) FISTULA  ;  Surgeon: Rosetta Posner, MD;  Location: [facility];  Service: Vascular;  Laterality: Left;  . .hemodialysis catheter Right 11/28/2015  . Fistula superficialization Left 05/10/2016    Procedure: Left Arm FISTULA SUPERFICIALIZATION;  Surgeon: Rosetta Posner, MD;  Location: [facility];  Service: Vascular;  Laterality: Left;      Social History:     Social History  Substance Use Topics  . Smoking status: Never Smoker   . Smokeless tobacco: Never Used  . Alcohol Use: No     Lives - at home w husband  Mobility - ambulates  without assistance   Family History :     Family History  Problem Relation Age of Onset  . Colon cancer Mother 3  . Stroke Mother 22  . Coronary artery disease Mother       Home Medications:   Prior to Admission medications   Medication Sig Start Date End Date Taking? Authorizing Provider  acetaminophen (TYLENOL) 325 MG tablet Take 650 mg by mouth every 6 (six) hours as needed for moderate pain or fever.     Historical Provider, MD  albuterol (PROVENTIL) (2.5 MG/3ML) 0.083% nebulizer solution Take 2.5 mg by nebulization every 6 (six) hours as needed for wheezing or shortness of breath.    Historical Provider, MD  ALPRAZolam Duanne Moron) 0.5 MG tablet Take 0.5 mg by mouth 3 (three) times daily as needed for anxiety or sleep.     Historical Provider, MD  amLODipine (NORVASC) 10 MG tablet Take 10 mg by mouth daily.    Historical Provider, MD  b complex-vitamin c-folic acid (NEPHRO-VITE) 0.8 MG TABS tablet Take 1 tablet by mouth 3 (three) times daily.     Historical Provider, MD  diphenhydrAMINE (BENADRYL) 25 mg capsule Take 25 mg by mouth every 6 (six) hours as needed for allergies.    Historical Provider, MD  insulin NPH-insulin regular (NOVOLIN 70/30) (70-30) 100 UNIT/ML injection Inject 60 Units into the skin 2 (two) times daily. Reported on 03/12/2016    Historical Provider, MD  metoprolol (LOPRESSOR) 50 MG tablet Take 0.5 tablets (25 mg total) by mouth 2 (two) times daily. 12/04/15   [name], MD  ondansetron (ZOFRAN-ODT) 4 MG disintegrating tablet Take 4 mg by mouth every 8 (eight) hours as needed for nausea or vomiting.    Historical Provider, MD  Oxycodone HCl 10 MG TABS Take 1 tablet (10 mg total) by mouth 3 (three) times daily as needed (pain). 05/10/16   Alvia Grove, PA-C  promethazine (PHENERGAN) 12.5 MG tablet Take 12.5 mg by mouth every  6 (six) hours as needed for nausea or vomiting (alternates with Zofran).    Historical Provider, MD  Sucroferric Oxyhydroxide (VELPHORO  PO) Take 500 mg by mouth. Takes 2 tablets with each meal and one tablet with each snack.    Historical Provider, MD     Allergies:     Allergies  Allergen Reactions  . Contrast Media [Iodinated Diagnostic Agents] Anaphylaxis, Hives and Swelling    Dye for cardiac cath. Tongue swells  . Pineapple Anaphylaxis, Itching and Swelling  . Pneumococcal Vaccines Swelling    Turns skin black, and bodily swelling  . Shellfish Allergy Itching and Swelling    Facial swelling   . Vancomycin Nausea And Vomiting    Infusion "made me feel like I was dying" had to be readmitted to hospital     Physical Exam:   Vitals  Blood pressure 97/61, pulse 103, temperature 103.1 F (39.5 C), temperature source Oral, resp. rate 20, height _0  (1.651 m), weight 122.018 kg (269 lb), SpO2 98 %.   1. General  lying in bed in NAD,    2. Normal affect and insight, Not Suicidal or Homicidal, Awake Alert, Oriented X 3.  3. No F.N deficits, ALL C.Nerves Intact, Strength 5/5 all 4 extremities, Sensation intact all 4 extremities, Plantars down going.  4. Ears and Eyes appear Normal, Conjunctivae clear, PERRLA. Moist Oral Mucosa.  5. Supple Neck, No JVD, No cervical lymphadenopathy appriciated, No Carotid Bruits.  6. Symmetrical Chest wall movement, Good air movement bilaterally, CTAB.  7. RRR, No Gallops, Rubs.  + 2/6 sem rusb.   No Parasternal Heave.  8. Positive Bowel Sounds, Abdomen Soft, No tenderness, No organomegaly appriciated,No rebound -guarding or rigidity.  9.  No Cyanosis, Normal Skin Turgor, No Skin Rash or Bruise.  +lymphedema of the left>right distal lower ext,  R upper chest hickman,  Left forearm AVF  + thrill.   10. Good muscle tone,  joints appear normal , no effusions, Normal ROM.  11. No Palpable Lymph Nodes in Neck or Axillae     Data Review:    CBC  Recent Labs Lab 06/06/16 1700  WBC 22.6*  HGB 13.1  HCT 39.9  PLT 302  MCV 93.2  MCH 30.6  MCHC 32.8  RDW 17.4*   LYMPHSABS 1.1  MONOABS 0.7  EOSABS 0.1  BASOSABS 0.0   ------------------------------------------------------------------------------------------------------------------  Chemistries   Recent Labs Lab 06/06/16 1700  NA 131*  K 4.4  CL 98*  CO2 22  GLUCOSE 164*  BUN 48*  CREATININE 6.40*  CALCIUM 9.3  AST 31  ALT 30  ALKPHOS 131*  BILITOT 0.7   ------------------------------------------------------------------------------------------------------------------ estimated creatinine clearance is 13.9 mL/min (by C-G formula based on Cr of 6.4). ------------------------------------------------------------------------------------------------------------------ No results for input(s): TSH, T4TOTAL, T3FREE, THYROIDAB in the last 72 hours.  Invalid input(s): FREET3  Coagulation profile No results for input(s): INR, PROTIME in the last 168 hours. ------------------------------------------------------------------------------------------------------------------- No results for input(s): DDIMER in the last 72 hours. -------------------------------------------------------------------------------------------------------------------  Cardiac Enzymes No results for input(s): CKMB, TROPONINI, MYOGLOBIN in the last 168 hours.  Invalid input(s): CK ------------------------------------------------------------------------------------------------------------------    Component Value Date/Time   BNP 56.0 04/07/2016 1715     ---------------------------------------------------------------------------------------------------------------  Urinalysis    Component Value Date/Time   COLORURINE YELLOW 06/06/2016 1721   APPEARANCEUR CLEAR 06/06/2016 1721   LABSPEC >1.030* 06/06/2016 1721   PHURINE 5.0 06/06/2016 1721   GLUCOSEU NEGATIVE 06/06/2016 1721   HGBUR NEGATIVE 06/06/2016 1721   BILIRUBINUR SMALL* 06/06/2016 1721  KETONESUR TRACE* 06/06/2016 1721   PROTEINUR 100* 06/06/2016 1721    UROBILINOGEN 0.2 12/02/2014 1925   NITRITE NEGATIVE 06/06/2016 1721   LEUKOCYTESUR NEGATIVE 06/06/2016 1721    ----------------------------------------------------------------------------------------------------------------   Imaging Results:    Dg Chest 2 View  06/06/2016  CLINICAL DATA:  Fever, body aches. EXAM: CHEST  2 VIEW COMPARISON:  04/07/2016 FINDINGS: Large bore right internal jugular approach central venous catheter, and left subclavian approach injectable port are stable in position. The cardiac silhouette is enlarged. Mediastinal contours appear intact. There is no evidence of focal airspace consolidation, pleural effusion or pneumothorax. There is mild interstitial pulmonary edema. Osseous structures are without acute abnormality. Soft tissues are grossly normal. IMPRESSION: Enlarged cardiac silhouette with mild interstitial pulmonary edema. Electronically Signed   By: Fidela Salisbury M.D.   On: 06/06/2016 18:16   Ct Chest Wo Contrast  06/06/2016  CLINICAL DATA:  Chills with body aches and generalized abdominal pain. Increasing shortness of breath beginning this afternoon. Dialysis yesterday. EXAM: CT CHEST, ABDOMEN AND PELVIS WITHOUT CONTRAST TECHNIQUE: Multidetector CT imaging of the chest, abdomen and pelvis was performed following the standard protocol without IV contrast. COMPARISON:  CT abdomen/ pelvis 12/08/2015 and 12/02/2014 as well as CT chest 11/06/2011 FINDINGS: CT CHEST Lungs are well inflated without consolidation or effusion. Minimal linear scarring over the lingula. Airways are normal. Left subclavian Port-A-Cath has tip over the SVC. Right IJ dialysis catheter has tip over the right atrium. Mild cardiomegaly. Mediastinal structures otherwise within normal. No hilar or mediastinal adenopathy. CT ABDOMEN AND PELVIS The liver, spleen, pancreas and adrenal glands are within normal. Gallbladder is slightly contracted as there is a small focus of air over the nondependent  portion of the gallbladder. Subtle hazy attenuation of the adjacent fat as cannot exclude an inflammatory process involving the gallbladder. Kidneys are normal in size without hydronephrosis or nephrolithiasis. Ureters are within normal. Vascular structures are unremarkable. The stomach is within normal. Small bowel is within normal. Appendix is not well visualized. Minimal diverticulosis of the colon. Mesentery is within normal. Small umbilical hernia containing only peritoneal fat unchanged. Pelvic images demonstrate surgical absence of the uterus. The bladder and rectum are within normal. Right ovary is smaller measuring 5 cm. Left ovary not visualized. Remaining bones soft tissues are within normal. IMPRESSION: No acute cardiopulmonary disease. Small focus of air within the nondependent portion of the gallbladder. Gallbladder slightly contracted with hazy attenuation of the adjacent fat as cannot exclude an acute inflammatory or infectious process. Consider right upper quadrant ultrasound for further evaluation. Minimal diverticulosis of the colon. Small umbilical hernia containing only mesenteric fat. Mild stable cardiomegaly. Electronically Signed   By: Marin Olp M.D.   On: 06/06/2016 20:29   Ct Renal Stone Study  06/06/2016  CLINICAL DATA:  Chills with body aches and generalized abdominal pain. Increasing shortness of breath beginning this afternoon. Dialysis yesterday. EXAM: CT CHEST, ABDOMEN AND PELVIS WITHOUT CONTRAST TECHNIQUE: Multidetector CT imaging of the chest, abdomen and pelvis was performed following the standard protocol without IV contrast. COMPARISON:  CT abdomen/ pelvis 12/08/2015 and 12/02/2014 as well as CT chest 11/06/2011 FINDINGS: CT CHEST Lungs are well inflated without consolidation or effusion. Minimal linear scarring over the lingula. Airways are normal. Left subclavian Port-A-Cath has tip over the SVC. Right IJ dialysis catheter has tip over the right atrium. Mild  cardiomegaly. Mediastinal structures otherwise within normal. No hilar or mediastinal adenopathy. CT ABDOMEN AND PELVIS The liver, spleen, pancreas and adrenal glands are within  normal. Gallbladder is slightly contracted as there is a small focus of air over the nondependent portion of the gallbladder. Subtle hazy attenuation of the adjacent fat as cannot exclude an inflammatory process involving the gallbladder. Kidneys are normal in size without hydronephrosis or nephrolithiasis. Ureters are within normal. Vascular structures are unremarkable. The stomach is within normal. Small bowel is within normal. Appendix is not well visualized. Minimal diverticulosis of the colon. Mesentery is within normal. Small umbilical hernia containing only peritoneal fat unchanged. Pelvic images demonstrate surgical absence of the uterus. The bladder and rectum are within normal. Right ovary is smaller measuring 5 cm. Left ovary not visualized. Remaining bones soft tissues are within normal. IMPRESSION: No acute cardiopulmonary disease. Small focus of air within the nondependent portion of the gallbladder. Gallbladder slightly contracted with hazy attenuation of the adjacent fat as cannot exclude an acute inflammatory or infectious process. Consider right upper quadrant ultrasound for further evaluation. Minimal diverticulosis of the colon. Small umbilical hernia containing only mesenteric fat. Mild stable cardiomegaly. Electronically Signed   By: Marin Olp M.D.   On: 06/06/2016 20:29       Assessment & Plan:    Principal Problem:   Fever Active Problems:   Diabetes mellitus ([place])   HTN (hypertension)   UTI (lower urinary tract infection)   ESRD (end stage renal disease) ([place])    1. Fever, secondary to uti? Blood culture x2 tx with cubicin and zosyn iv pharmacy to dose.  Pt has vanco allergy  2.  Hypotension,  Pt sbp 88 on monitor,  Will hydrate gently with ns iv.   3. ESRD on HD (T, T, S),  Nephrology  consult placed in computer for Dr. Hinda Lenis.  Appreciate his input.   4.  Hyponatremia Ns iv as above  5. Dm2 fsbs ac and qhs, iss Cont current regimen  6. Abnormality of gallbladder on CT  Ultrasound ordered  7. Nausea  zofran prn  8.  Tachcyardia Check tsh, check trop i q6h x3  Check cardiac echo.   DVT Prophylaxis Heparin - SCDs  AM Labs Ordered, also please review Full Orders  Family Communication: Admission, patients condition and plan of care including tests being ordered have been discussed with the patient  who indicate understanding and agree with the plan and Code Status.  Code Status FULL CODE  Likely DC to  home  Condition GUARDED    Consults called: nephrology consult placed in computer  Admission status: inpatient  Time spent in minutes : 45 minutes   Jani Gravel M.D on 06/06/2016 at 9:31 PM  Between 7am to 7pm - Pager - [phone]. After 7pm go to [email or web address] - password [email or web address]  [facility] - Office  [phone]

## 2016-06-06 NOTE — ED Notes (Signed)
Dr Regenia Skeeter notified of fever 103. No new orders

## 2016-06-07 ENCOUNTER — Encounter (HOSPITAL_COMMUNITY): Payer: Self-pay

## 2016-06-07 ENCOUNTER — Inpatient Hospital Stay (HOSPITAL_COMMUNITY): Payer: Medicare Other

## 2016-06-07 DIAGNOSIS — R509 Fever, unspecified: Secondary | ICD-10-CM

## 2016-06-07 LAB — COMPREHENSIVE METABOLIC PANEL
ALT: 22 U/L (ref 14–54)
AST: 22 U/L (ref 15–41)
Albumin: 3.1 g/dL — ABNORMAL LOW (ref 3.5–5.0)
Alkaline Phosphatase: 91 U/L (ref 38–126)
Anion gap: 10 (ref 5–15)
BUN: 56 mg/dL — AB (ref 6–20)
CHLORIDE: 101 mmol/L (ref 101–111)
CO2: 22 mmol/L (ref 22–32)
Calcium: 8.3 mg/dL — ABNORMAL LOW (ref 8.9–10.3)
Creatinine, Ser: 7.46 mg/dL — ABNORMAL HIGH (ref 0.44–1.00)
GFR calc Af Amer: 7 mL/min — ABNORMAL LOW (ref >60)
GFR, EST NON AFRICAN AMERICAN: 6 mL/min — AB (ref >60)
Glucose, Bld: 164 mg/dL — ABNORMAL HIGH (ref 65–99)
POTASSIUM: 4.5 mmol/L (ref 3.5–5.1)
Sodium: 133 mmol/L — ABNORMAL LOW (ref 135–145)
Total Bilirubin: 1.1 mg/dL (ref 0.3–1.2)
Total Protein: 6.9 g/dL (ref 6.5–8.1)

## 2016-06-07 LAB — GLUCOSE, CAPILLARY
GLUCOSE-CAPILLARY: 177 mg/dL — AB (ref 65–99)
Glucose-Capillary: 175 mg/dL — ABNORMAL HIGH (ref 65–99)
Glucose-Capillary: 169 mg/dL — ABNORMAL HIGH (ref 65–99)
Glucose-Capillary: 92 mg/dL (ref 65–99)
Glucose-Capillary: 126 mg/dL — ABNORMAL HIGH (ref 65–99)

## 2016-06-07 LAB — ECHOCARDIOGRAM COMPLETE
CHL CUP MV DEC (S): 239
E/e' ratio: 6.26
EWDT: 239 ms
FS: 50 % — AB (ref 28–44)
Height: 65 in
IV/PV OW: 1.25
LA ID, A-P, ES: 37 mm
LA diam end sys: 37 mm
LA vol index: 24.7 mL/m2
LA vol: 57.6 mL
LADIAMINDEX: 1.59 cm/m2
LAVOLA4C: 53.9 mL
LV SIMPSON'S DISK: 69
LV TDI E'LATERAL: 15.2
LV TDI E'MEDIAL: 6.31
LV dias vol index: 16 mL/m2
LV e' LATERAL: 15.2 cm/s
LVDIAVOL: 38 mL — AB (ref 46–106)
LVEEAVG: 6.26
LVEEMED: 6.26
LVOT area: 2.27 cm2
LVOT diameter: 17 mm
LVSYSVOL: 12 mL — AB (ref 14–42)
LVSYSVOLIN: 5 mL/m2
MVPG: 4 mmHg
MVPKAVEL: 78.6 m/s
MVPKEVEL: 95.1 m/s
PW: 12 mm — AB (ref 0.6–1.1)
RV TAPSE: 24.4 mm
Reg peak vel: 451 cm/s
Stroke v: 26 ml
TRMAXVEL: 451 cm/s
Weight: 4705.5 oz

## 2016-06-07 LAB — CBC
HEMATOCRIT: 32.4 % — AB (ref 36.0–46.0)
HEMOGLOBIN: 10.4 g/dL — AB (ref 12.0–15.0)
MCH: 30.1 pg (ref 26.0–34.0)
MCHC: 32.1 g/dL (ref 30.0–36.0)
MCV: 93.6 fL (ref 78.0–100.0)
Platelets: 264 10*3/uL (ref 150–400)
RBC: 3.46 MIL/uL — AB (ref 3.87–5.11)
RDW: 17.5 % — AB (ref 11.5–15.5)
WBC: 25.6 10*3/uL — AB (ref 4.0–10.5)

## 2016-06-07 LAB — BLOOD CULTURE ID PANEL (REFLEXED)
ACINETOBACTER BAUMANNII: NOT DETECTED
CARBAPENEM RESISTANCE: NOT DETECTED
Candida albicans: NOT DETECTED
Candida glabrata: NOT DETECTED
Candida krusei: NOT DETECTED
Candida parapsilosis: NOT DETECTED
Candida tropicalis: NOT DETECTED
ENTEROBACTERIACEAE SPECIES: NOT DETECTED
ENTEROCOCCUS SPECIES: NOT DETECTED
Enterobacter cloacae complex: NOT DETECTED
Escherichia coli: NOT DETECTED
Haemophilus influenzae: NOT DETECTED
Klebsiella oxytoca: NOT DETECTED
Klebsiella pneumoniae: NOT DETECTED
LISTERIA MONOCYTOGENES: NOT DETECTED
Methicillin resistance: NOT DETECTED
NEISSERIA MENINGITIDIS: NOT DETECTED
PSEUDOMONAS AERUGINOSA: NOT DETECTED
Proteus species: NOT DETECTED
SERRATIA MARCESCENS: NOT DETECTED
STAPHYLOCOCCUS AUREUS BCID: NOT DETECTED
STAPHYLOCOCCUS SPECIES: DETECTED — AB
STREPTOCOCCUS AGALACTIAE: NOT DETECTED
STREPTOCOCCUS PYOGENES: NOT DETECTED
STREPTOCOCCUS SPECIES: NOT DETECTED
Streptococcus pneumoniae: NOT DETECTED
VANCOMYCIN RESISTANCE: NOT DETECTED

## 2016-06-07 LAB — TROPONIN I
TROPONIN I: 0.03 ng/mL (ref <0.031)
Troponin I: 0.03 ng/mL (ref <0.031)

## 2016-06-07 LAB — TSH: TSH: 1.402 u[IU]/mL (ref 0.350–4.500)

## 2016-06-07 LAB — GAMMA GT: GGT: 125 U/L — AB (ref 7–50)

## 2016-06-07 LAB — MRSA PCR SCREENING: MRSA by PCR: NEGATIVE

## 2016-06-07 LAB — SEDIMENTATION RATE: Sed Rate: 55 mm/hr — ABNORMAL HIGH (ref 0–22)

## 2016-06-07 MED ORDER — SODIUM CHLORIDE 0.9% FLUSH
3.0000 mL | Freq: Two times a day (BID) | INTRAVENOUS | Status: DC
  Administered 2016-06-07 – 2016-06-10 (×6): 3 mL via INTRAVENOUS

## 2016-06-07 MED ORDER — METOPROLOL TARTRATE 25 MG PO TABS
25.0000 mg | ORAL_TABLET | Freq: Two times a day (BID) | ORAL | Status: DC
  Administered 2016-06-09 – 2016-06-11 (×3): 25 mg via ORAL
  Filled 2016-06-07 (×5): qty 1

## 2016-06-07 MED ORDER — SODIUM CHLORIDE 0.9 % IV BOLUS (SEPSIS)
250.0000 mL | Freq: Once | INTRAVENOUS | Status: AC
  Administered 2016-06-07: 250 mL via INTRAVENOUS

## 2016-06-07 MED ORDER — SODIUM CHLORIDE 0.9% FLUSH
3.0000 mL | Freq: Two times a day (BID) | INTRAVENOUS | Status: DC
  Administered 2016-06-07 – 2016-06-10 (×4): 3 mL via INTRAVENOUS

## 2016-06-07 MED ORDER — SODIUM CHLORIDE 0.9 % IV SOLN: 250.0000 mL | INTRAVENOUS | Status: DC | PRN

## 2016-06-07 MED ORDER — AMLODIPINE BESYLATE 5 MG PO TABS: 10.0000 mg | ORAL_TABLET | Freq: Every day | ORAL | Status: DC

## 2016-06-07 MED ORDER — ACETAMINOPHEN 325 MG PO TABS: 650.0000 mg | ORAL_TABLET | Freq: Four times a day (QID) | ORAL | Status: DC | PRN

## 2016-06-07 MED ORDER — HEPARIN SODIUM (PORCINE) 5000 UNIT/ML IJ SOLN
5000.0000 [IU] | Freq: Three times a day (TID) | INTRAMUSCULAR | Status: DC
  Administered 2016-06-07 – 2016-06-11 (×13): 5000 [IU] via SUBCUTANEOUS
  Filled 2016-06-07 (×12): qty 1

## 2016-06-07 MED ORDER — ALBUTEROL SULFATE (2.5 MG/3ML) 0.083% IN NEBU: 2.5000 mg | INHALATION_SOLUTION | Freq: Four times a day (QID) | RESPIRATORY_TRACT | Status: DC | PRN

## 2016-06-07 MED ORDER — PERFLUTREN LIPID MICROSPHERE
1.0000 mL | INTRAVENOUS | Status: AC | PRN
  Administered 2016-06-07: 2 mL via INTRAVENOUS
  Filled 2016-06-07: qty 10

## 2016-06-07 MED ORDER — SUCROFERRIC OXYHYDROXIDE 500 MG PO CHEW: 500.0000 mg | CHEWABLE_TABLET | Freq: Every day | ORAL | Status: DC

## 2016-06-07 MED ORDER — INSULIN ASPART 100 UNIT/ML ~~LOC~~ SOLN
0.0000 [IU] | Freq: Three times a day (TID) | SUBCUTANEOUS | Status: DC
  Administered 2016-06-07 (×2): 2 [IU] via SUBCUTANEOUS
  Administered 2016-06-11: 1 [IU] via SUBCUTANEOUS

## 2016-06-07 MED ORDER — INSULIN ASPART 100 UNIT/ML ~~LOC~~ SOLN: 0.0000 [IU] | Freq: Every day | SUBCUTANEOUS | Status: DC

## 2016-06-07 MED ORDER — SODIUM CHLORIDE 0.9% FLUSH: 3.0000 mL | INTRAVENOUS | Status: DC | PRN

## 2016-06-07 MED ORDER — SEVELAMER CARBONATE 800 MG PO TABS
1600.0000 mg | ORAL_TABLET | Freq: Three times a day (TID) | ORAL | Status: DC
  Administered 2016-06-07 – 2016-06-08 (×2): 1600 mg via ORAL
  Filled 2016-06-07: qty 2

## 2016-06-07 MED ORDER — ALPRAZOLAM 0.5 MG PO TABS
0.5000 mg | ORAL_TABLET | Freq: Three times a day (TID) | ORAL | Status: DC | PRN
  Administered 2016-06-08 – 2016-06-10 (×3): 0.5 mg via ORAL
  Filled 2016-06-07 (×3): qty 1

## 2016-06-07 MED ORDER — SEVELAMER CARBONATE 800 MG PO TABS
800.0000 mg | ORAL_TABLET | ORAL | Status: DC
Start: 2016-06-07 — End: 2016-06-08
  Administered 2016-06-07: 800 mg via ORAL
  Filled 2016-06-07: qty 1

## 2016-06-07 MED ORDER — INSULIN ASPART PROT & ASPART (70-30 MIX) 100 UNIT/ML ~~LOC~~ SUSP
60.0000 [IU] | Freq: Two times a day (BID) | SUBCUTANEOUS | Status: DC
  Administered 2016-06-07 – 2016-06-08 (×2): 60 [IU] via SUBCUTANEOUS
  Filled 2016-06-07: qty 10

## 2016-06-07 MED ORDER — PIPERACILLIN SOD-TAZOBACTAM SO 2.25 (2-0.25) G IV SOLR
2.2500 g | Freq: Three times a day (TID) | INTRAVENOUS | Status: DC
  Administered 2016-06-07 – 2016-06-09 (×6): 2.25 g via INTRAVENOUS
  Filled 2016-06-07 (×9): qty 2.25

## 2016-06-07 MED ORDER — NOREPINEPHRINE BITARTRATE 1 MG/ML IV SOLN
INTRAVENOUS | Status: AC
  Filled 2016-06-07: qty 4

## 2016-06-07 MED ORDER — OXYCODONE HCL 5 MG PO TABS
10.0000 mg | ORAL_TABLET | Freq: Three times a day (TID) | ORAL | Status: DC | PRN
  Administered 2016-06-07 – 2016-06-11 (×8): 10 mg via ORAL
  Filled 2016-06-07 (×8): qty 2

## 2016-06-07 MED ORDER — SUCROFERRIC OXYHYDROXIDE 500 MG PO CHEW: 500.0000 mg | CHEWABLE_TABLET | ORAL | Status: DC

## 2016-06-07 MED ORDER — RENA-VITE PO TABS
1.0000 | ORAL_TABLET | Freq: Every day | ORAL | Status: DC
  Administered 2016-06-07 – 2016-06-10 (×4): 1 via ORAL
  Filled 2016-06-07 (×4): qty 1

## 2016-06-07 MED ORDER — SUCROFERRIC OXYHYDROXIDE 500 MG PO CHEW
1000.0000 mg | CHEWABLE_TABLET | Freq: Three times a day (TID) | ORAL | Status: DC
  Filled 2016-06-07: qty 2

## 2016-06-07 MED ORDER — SODIUM CHLORIDE 0.9 % IV SOLN
500.0000 mg | INTRAVENOUS | Status: DC
  Administered 2016-06-08: 500 mg via INTRAVENOUS
  Filled 2016-06-07: qty 10

## 2016-06-07 MED ORDER — DIPHENHYDRAMINE HCL 25 MG PO CAPS: 25.0000 mg | ORAL_CAPSULE | Freq: Four times a day (QID) | ORAL | Status: DC | PRN

## 2016-06-07 MED ORDER — SODIUM CHLORIDE 0.9 % IV SOLN
0.0000 ug/min | INTRAVENOUS | Status: DC
  Administered 2016-06-07: 5.013 ug/min via INTRAVENOUS
  Administered 2016-06-07: 4 ug/min via INTRAVENOUS
  Filled 2016-06-07 (×2): qty 4

## 2016-06-07 MED ORDER — ACETAMINOPHEN 650 MG RE SUPP: 650.0000 mg | Freq: Four times a day (QID) | RECTAL | Status: DC | PRN

## 2016-06-07 MED ORDER — PROMETHAZINE HCL 12.5 MG PO TABS: 12.5000 mg | ORAL_TABLET | Freq: Four times a day (QID) | ORAL | Status: DC | PRN

## 2016-06-07 MED ORDER — ONDANSETRON 4 MG PO TBDP
4.0000 mg | ORAL_TABLET | Freq: Three times a day (TID) | ORAL | Status: DC | PRN
  Administered 2016-06-08 – 2016-06-10 (×3): 4 mg via ORAL
  Filled 2016-06-07 (×5): qty 1

## 2016-06-07 NOTE — Progress Notes (Signed)
CRITICAL VALUE ALERT  Critical value received:  Gram positive Cocci in clusters blood cultures  Date of notification:  06/07/2016  Time of notification:  4356  Critical value read back:yes  Nurse who received alert:  E.Concha Sudol RN  MD notified (1st page): Dr. [name]  Time of first page:  [phone]  MD notified (2nd page):  Time of second page:  Responding MD:  Dr. [name]  Time MD responded:  [phone]

## 2016-06-07 NOTE — Care Management Note (Signed)
Case Management Note  Patient Details  Name: ADEENA BERNABE MRN: [id number] Date of Birth: 1966-06-18  Subjective/Objective:                  Pt admitted with ?fever. Pt is from home, lives with her husband. Pt is mostly ind with ADL's. Pt recently discharged from [facility] services through [facility]. Pt states she ambulates some with a walker. Pt has hospital bed, WC, home O2 and CPAP through [facility]. Pt receive HD T/TH/Sa at [facility] in [place]. Pt's husband at bedside. Pt states she wants to use [facility] again for [facility] services if needed.  Action/Plan: Anticipate return home with [facility] services. Will cont to follow for DC planning.   Expected Discharge Date:    06/11/2016              Expected Discharge Plan:  [place]  In-House Referral:  NA  Discharge planning Services  CM Consult  Post Acute Care Choice:  Home Health Choice offered to:  Patient  DME Arranged:    DME Agency:     HH Arranged:  RN [place] Agency:  [facility]  Status of Service:  In process, will continue to follow  Medicare Important Message Given:  Yes Date Medicare IM Given:    Medicare IM give by:    Date Additional Medicare IM Given:    Additional Medicare Important Message give by:     If discussed at [place] of Stay Meetings, dates discussed:    Additional Comments:  Sherald Barge, RN 06/07/2016, 2:11 PM

## 2016-06-07 NOTE — Care Management Important Message (Signed)
Important Message  Patient Details  Name: [name] MRN: [id number] Date of Birth: 23-Oct-1966   Medicare Important Message Given:  Yes    Sherald Barge, RN 06/07/2016, 2:08 PM

## 2016-06-07 NOTE — Progress Notes (Signed)
Order obtained. Maintaining a sterile area after cleaning site and allowing to dry, a single attempt to access port was successful, no difficulty. Port flushes easily and blood return is easy to obtain. Currently infusing. Clean Dry and intact. Site is clean and WNL.

## 2016-06-07 NOTE — Progress Notes (Signed)
Subjective: She was admitted yesterday with fever to 103.1. The source is not clear yet. She complains of pain in her right ear. Her urinalysis looked like it might be infected. She has other sources including a dialysis catheter and a Port-A-Cath as well as a fistula. She has had previous episodes of cellulitis but she's not having any issues with pain in her legs now.  Objective: Vital signs in last 24 hours: Temp:  [99.7 F (37.6 C)-103.1 F (39.5 C)] 99.7 F (37.6 C) (06/18 2253) Pulse Rate:  [73-107] 74 (06/19 0700) Resp:  [18-34] 28 (06/19 0700) BP: (63-143)/(40-72) 91/52 mmHg (06/19 0700) SpO2:  [93 %-100 %] 97 % (06/19 0700) FiO2 (%):  [28 %] 28 % (06/19 0315) Weight:  [122.018 kg (269 lb)-133.4 kg (294 lb 1.5 oz)] 133.4 kg (294 lb 1.5 oz) (06/19 0400) Weight change:     Intake/Output from previous day: 06/18 0701 - 06/19 0700 In: 630.4 [I.V.:630.4] Out: -   PHYSICAL EXAM General appearance: alert, cooperative, mild distress and morbidly obese Resp: clear to auscultation bilaterally Cardio: regular rate and rhythm, S1, S2 normal, no murmur, click, rub or gallop GI: soft, non-tender; bowel sounds normal; no masses,  no organomegaly Extremities: She has lymphedema bilaterally but does not show evidence of cellulitis  Lab Results:  Results for orders placed or performed during the hospital encounter of 06/06/16 (from the past 48 hour(s))  Comprehensive metabolic panel     Status: Abnormal   Collection Time: 06/06/16  5:00 PM  Result Value Ref Range   Sodium 131 (L) 135 - 145 mmol/L   Potassium 4.4 3.5 - 5.1 mmol/L   Chloride 98 (L) 101 - 111 mmol/L   CO2 22 22 - 32 mmol/L   Glucose, Bld 164 (H) 65 - 99 mg/dL   BUN 48 (H) 6 - 20 mg/dL   Creatinine, Ser 6.40 (H) 0.44 - 1.00 mg/dL   Calcium 9.3 8.9 - 10.3 mg/dL   Total Protein 8.7 (H) 6.5 - 8.1 g/dL   Albumin 4.1 3.5 - 5.0 g/dL   AST 31 15 - 41 U/L   ALT 30 14 - 54 U/L   Alkaline Phosphatase 131 (H) 38 - 126 U/L    Total Bilirubin 0.7 0.3 - 1.2 mg/dL   GFR calc non Af Amer 7 (L) >60 mL/min   GFR calc Af Amer 8 (L) >60 mL/min    Comment: (NOTE) The eGFR has been calculated using the CKD EPI equation. This calculation has not been validated in all clinical situations. eGFR's persistently <60 mL/min signify possible Chronic Kidney Disease.    Anion gap 11 5 - 15  CBC with Differential     Status: Abnormal   Collection Time: 06/06/16  5:00 PM  Result Value Ref Range   WBC 22.6 (H) 4.0 - 10.5 K/uL   RBC 4.28 3.87 - 5.11 MIL/uL   Hemoglobin 13.1 12.0 - 15.0 g/dL   HCT 39.9 36.0 - 46.0 %   MCV 93.2 78.0 - 100.0 fL   MCH 30.6 26.0 - 34.0 pg   MCHC 32.8 30.0 - 36.0 g/dL   RDW 17.4 (H) 11.5 - 15.5 %   Platelets 302 150 - 400 K/uL   Neutrophils Relative % 91 %   Neutro Abs 20.7 (H) 1.7 - 7.7 K/uL   Lymphocytes Relative 5 %   Lymphs Abs 1.1 0.7 - 4.0 K/uL   Monocytes Relative 3 %   Monocytes Absolute 0.7 0.1 - 1.0 K/uL   Eosinophils  Relative 1 %   Eosinophils Absolute 0.1 0.0 - 0.7 K/uL   Basophils Relative 0 %   Basophils Absolute 0.0 0.0 - 0.1 K/uL  Lactic acid, plasma     Status: None   Collection Time: 06/06/16  5:00 PM  Result Value Ref Range   Lactic Acid, Venous 1.8 0.5 - 2.0 mmol/L  Culture, blood (routine x 2)     Status: None (Preliminary result)   Collection Time: 06/06/16  5:16 PM  Result Value Ref Range   Specimen Description BLOOD RIGHT HAND    Special Requests BOTTLES DRAWN AEROBIC AND ANAEROBIC 6CC EACH    Culture PENDING    Report Status PENDING   Urinalysis, Routine w reflex microscopic     Status: Abnormal   Collection Time: 06/06/16  5:21 PM  Result Value Ref Range   Color, Urine YELLOW YELLOW   APPearance CLEAR CLEAR   Specific Gravity, Urine >1.030 (H) 1.005 - 1.030   pH 5.0 5.0 - 8.0   Glucose, UA NEGATIVE NEGATIVE mg/dL   Hgb urine dipstick NEGATIVE NEGATIVE   Bilirubin Urine SMALL (A) NEGATIVE   Ketones, ur TRACE (A) NEGATIVE mg/dL   Protein, ur 100 (A)  NEGATIVE mg/dL   Nitrite NEGATIVE NEGATIVE   Leukocytes, UA NEGATIVE NEGATIVE  Urine microscopic-add on     Status: Abnormal   Collection Time: 06/06/16  5:21 PM  Result Value Ref Range   Squamous Epithelial / LPF 0-5 (A) NONE SEEN   WBC, UA 6-30 0 - 5 WBC/hpf   RBC / HPF 0-5 0 - 5 RBC/hpf   Bacteria, UA MANY (A) NONE SEEN  Culture, blood (routine x 2)     Status: None (Preliminary result)   Collection Time: 06/06/16  5:45 PM  Result Value Ref Range   Specimen Description BLOOD PORT DRAW    Special Requests BOTTLES DRAWN AEROBIC AND ANAEROBIC 6CC EACH    Culture PENDING    Report Status PENDING   Influenza panel by PCR (type A & B, H1N1)     Status: None   Collection Time: 06/06/16  5:45 PM  Result Value Ref Range   Influenza A By PCR NEGATIVE NEGATIVE   Influenza B By PCR NEGATIVE NEGATIVE   H1N1 flu by pcr NOT DETECTED NOT DETECTED    Comment:        The Xpert Flu assay (FDA approved for nasal aspirates or washes and nasopharyngeal swab specimens), is intended as an aid in the diagnosis of influenza and should not be used as a sole basis for treatment.   Lactic acid, plasma     Status: None   Collection Time: 06/06/16  8:24 PM  Result Value Ref Range   Lactic Acid, Venous 1.6 0.5 - 2.0 mmol/L  Troponin I (q 6hr x 3)     Status: None   Collection Time: 06/06/16 10:51 PM  Result Value Ref Range   Troponin I 0.03 <0.031 ng/mL    Comment:        NO INDICATION OF MYOCARDIAL INJURY.   Glucose, capillary     Status: Abnormal   Collection Time: 06/07/16  2:05 AM  Result Value Ref Range   Glucose-Capillary 175 (H) 65 - 99 mg/dL  Troponin I (q 6hr x 3)     Status: None   Collection Time: 06/07/16  4:24 AM  Result Value Ref Range   Troponin I 0.03 <0.031 ng/mL    Comment:        NO INDICATION  OF MYOCARDIAL INJURY.   Sedimentation rate     Status: Abnormal   Collection Time: 06/07/16  4:24 AM  Result Value Ref Range   Sed Rate 55 (H) 0 - 22 mm/hr  Comprehensive  metabolic panel     Status: Abnormal   Collection Time: 06/07/16  4:24 AM  Result Value Ref Range   Sodium 133 (L) 135 - 145 mmol/L   Potassium 4.5 3.5 - 5.1 mmol/L   Chloride 101 101 - 111 mmol/L   CO2 22 22 - 32 mmol/L   Glucose, Bld 164 (H) 65 - 99 mg/dL   BUN 56 (H) 6 - 20 mg/dL   Creatinine, Ser 7.46 (H) 0.44 - 1.00 mg/dL   Calcium 8.3 (L) 8.9 - 10.3 mg/dL   Total Protein 6.9 6.5 - 8.1 g/dL   Albumin 3.1 (L) 3.5 - 5.0 g/dL   AST 22 15 - 41 U/L   ALT 22 14 - 54 U/L   Alkaline Phosphatase 91 38 - 126 U/L   Total Bilirubin 1.1 0.3 - 1.2 mg/dL   GFR calc non Af Amer 6 (L) >60 mL/min   GFR calc Af Amer 7 (L) >60 mL/min    Comment: (NOTE) The eGFR has been calculated using the CKD EPI equation. This calculation has not been validated in all clinical situations. eGFR's persistently <60 mL/min signify possible Chronic Kidney Disease.    Anion gap 10 5 - 15    ABGS No results for input(s): PHART, PO2ART, TCO2, HCO3 in the last 72 hours.  Invalid input(s): PCO2 CULTURES Recent Results (from the past 240 hour(s))  Culture, blood (routine x 2)     Status: None (Preliminary result)   Collection Time: 06/06/16  5:16 PM  Result Value Ref Range Status   Specimen Description BLOOD RIGHT HAND  Final   Special Requests BOTTLES DRAWN AEROBIC AND ANAEROBIC [facility] EACH  Final   Culture PENDING  Incomplete   Report Status PENDING  Incomplete  Culture, blood (routine x 2)     Status: None (Preliminary result)   Collection Time: 06/06/16  5:45 PM  Result Value Ref Range Status   Specimen Description BLOOD PORT DRAW  Final   Special Requests BOTTLES DRAWN AEROBIC AND ANAEROBIC [place]  Final   Culture PENDING  Incomplete   Report Status PENDING  Incomplete   Studies/Results: Dg Chest 2 View  06/06/2016  CLINICAL DATA:  Fever, body aches. EXAM: CHEST  2 VIEW COMPARISON:  04/07/2016 FINDINGS: Large bore right internal jugular approach central venous catheter, and left subclavian approach  injectable port are stable in position. The cardiac silhouette is enlarged. Mediastinal contours appear intact. There is no evidence of focal airspace consolidation, pleural effusion or pneumothorax. There is mild interstitial pulmonary edema. Osseous structures are without acute abnormality. Soft tissues are grossly normal. IMPRESSION: Enlarged cardiac silhouette with mild interstitial pulmonary edema. Electronically Signed   By: Fidela Salisbury M.D.   On: 06/06/2016 18:16   Ct Chest Wo Contrast  06/06/2016  CLINICAL DATA:  Chills with body aches and generalized abdominal pain. Increasing shortness of breath beginning this afternoon. Dialysis yesterday. EXAM: CT CHEST, ABDOMEN AND PELVIS WITHOUT CONTRAST TECHNIQUE: Multidetector CT imaging of the chest, abdomen and pelvis was performed following the standard protocol without IV contrast. COMPARISON:  CT abdomen/ pelvis 12/08/2015 and 12/02/2014 as well as CT chest 11/06/2011 FINDINGS: CT CHEST Lungs are well inflated without consolidation or effusion. Minimal linear scarring over the lingula. Airways are normal. Left subclavian  Port-A-Cath has tip over the SVC. Right IJ dialysis catheter has tip over the right atrium. Mild cardiomegaly. Mediastinal structures otherwise within normal. No hilar or mediastinal adenopathy. CT ABDOMEN AND PELVIS The liver, spleen, pancreas and adrenal glands are within normal. Gallbladder is slightly contracted as there is a small focus of air over the nondependent portion of the gallbladder. Subtle hazy attenuation of the adjacent fat as cannot exclude an inflammatory process involving the gallbladder. Kidneys are normal in size without hydronephrosis or nephrolithiasis. Ureters are within normal. Vascular structures are unremarkable. The stomach is within normal. Small bowel is within normal. Appendix is not well visualized. Minimal diverticulosis of the colon. Mesentery is within normal. Small umbilical hernia containing only  peritoneal fat unchanged. Pelvic images demonstrate surgical absence of the uterus. The bladder and rectum are within normal. Right ovary is smaller measuring 5 cm. Left ovary not visualized. Remaining bones soft tissues are within normal. IMPRESSION: No acute cardiopulmonary disease. Small focus of air within the nondependent portion of the gallbladder. Gallbladder slightly contracted with hazy attenuation of the adjacent fat as cannot exclude an acute inflammatory or infectious process. Consider right upper quadrant ultrasound for further evaluation. Minimal diverticulosis of the colon. Small umbilical hernia containing only mesenteric fat. Mild stable cardiomegaly. Electronically Signed   By: Marin Olp M.D.   On: 06/06/2016 20:29   Ct Renal Stone Study  06/06/2016  CLINICAL DATA:  Chills with body aches and generalized abdominal pain. Increasing shortness of breath beginning this afternoon. Dialysis yesterday. EXAM: CT CHEST, ABDOMEN AND PELVIS WITHOUT CONTRAST TECHNIQUE: Multidetector CT imaging of the chest, abdomen and pelvis was performed following the standard protocol without IV contrast. COMPARISON:  CT abdomen/ pelvis 12/08/2015 and 12/02/2014 as well as CT chest 11/06/2011 FINDINGS: CT CHEST Lungs are well inflated without consolidation or effusion. Minimal linear scarring over the lingula. Airways are normal. Left subclavian Port-A-Cath has tip over the SVC. Right IJ dialysis catheter has tip over the right atrium. Mild cardiomegaly. Mediastinal structures otherwise within normal. No hilar or mediastinal adenopathy. CT ABDOMEN AND PELVIS The liver, spleen, pancreas and adrenal glands are within normal. Gallbladder is slightly contracted as there is a small focus of air over the nondependent portion of the gallbladder. Subtle hazy attenuation of the adjacent fat as cannot exclude an inflammatory process involving the gallbladder. Kidneys are normal in size without hydronephrosis or  nephrolithiasis. Ureters are within normal. Vascular structures are unremarkable. The stomach is within normal. Small bowel is within normal. Appendix is not well visualized. Minimal diverticulosis of the colon. Mesentery is within normal. Small umbilical hernia containing only peritoneal fat unchanged. Pelvic images demonstrate surgical absence of the uterus. The bladder and rectum are within normal. Right ovary is smaller measuring 5 cm. Left ovary not visualized. Remaining bones soft tissues are within normal. IMPRESSION: No acute cardiopulmonary disease. Small focus of air within the nondependent portion of the gallbladder. Gallbladder slightly contracted with hazy attenuation of the adjacent fat as cannot exclude an acute inflammatory or infectious process. Consider right upper quadrant ultrasound for further evaluation. Minimal diverticulosis of the colon. Small umbilical hernia containing only mesenteric fat. Mild stable cardiomegaly. Electronically Signed   By: Marin Olp M.D.   On: 06/06/2016 20:29    Medications:  Prior to Admission:  Prescriptions prior to admission  Medication Sig Dispense Refill Last Dose  . acetaminophen (TYLENOL) 325 MG tablet Take 650 mg by mouth every 6 (six) hours as needed for moderate pain or fever.  Unknown at Unknown time  . albuterol (PROVENTIL) (2.5 MG/3ML) 0.083% nebulizer solution Take 2.5 mg by nebulization every 6 (six) hours as needed for wheezing or shortness of breath.   Unknown at Unknown time  . ALPRAZolam (XANAX) 0.5 MG tablet Take 0.5 mg by mouth 3 (three) times daily as needed for anxiety or sleep.    Unknown at Unknown time  . amLODipine (NORVASC) 10 MG tablet Take 10 mg by mouth daily.   Unknown at Unknown time  . b complex-vitamin c-folic acid (NEPHRO-VITE) 0.8 MG TABS tablet Take 1 tablet by mouth 3 (three) times daily.    Unknown at Unknown time  . diphenhydrAMINE (BENADRYL) 25 mg capsule Take 25 mg by mouth every 6 (six) hours as needed for  allergies.   Unknown at Unknown time  . insulin NPH-insulin regular (NOVOLIN 70/30) (70-30) 100 UNIT/ML injection Inject 60 Units into the skin 2 (two) times daily. Reported on 03/12/2016   Unknown at Unknown time  . metoprolol (LOPRESSOR) 50 MG tablet Take 0.5 tablets (25 mg total) by mouth 2 (two) times daily. 30 tablet 12 Unknown at Unknown time  . ondansetron (ZOFRAN-ODT) 4 MG disintegrating tablet Take 4 mg by mouth every 8 (eight) hours as needed for nausea or vomiting.   Unknown at Unknown time  . Oxycodone HCl 10 MG TABS Take 1 tablet (10 mg total) by mouth 3 (three) times daily as needed (pain). 15 tablet 0 Unknown at Unknown time  . promethazine (PHENERGAN) 12.5 MG tablet Take 12.5 mg by mouth every 6 (six) hours as needed for nausea or vomiting (alternates with Zofran).   Unknown at Unknown time  . Sucroferric Oxyhydroxide (VELPHORO PO) Take 500 mg by mouth. Takes 2 tablets with each meal and one tablet with each snack.   Unknown at Unknown time   Scheduled: . amLODipine  10 mg Oral Daily  . heparin  5,000 Units Subcutaneous Q8H  . insulin aspart  0-5 Units Subcutaneous QHS  . insulin aspart  0-9 Units Subcutaneous TID WC  . insulin aspart protamine- aspart  60 Units Subcutaneous BID WC  . metoprolol  25 mg Oral BID  . multivitamin  1 tablet Oral QHS  . piperacillin-tazobactam (ZOSYN)  IV  2.25 g Intravenous Q8H  . sodium chloride flush  3 mL Intravenous Q12H  . sodium chloride flush  3 mL Intravenous Q12H  . sucroferric oxyhydroxide  500 mg Oral TID WC   Continuous: . sodium chloride 75 mL/hr at 06/07/16 0600  . norepinephrine (LEVOPHED) Adult infusion 6 mcg/min (06/07/16 0600)   ZDG:UYQIHK chloride, acetaminophen **OR** acetaminophen, acetaminophen, albuterol, ALPRAZolam, diphenhydrAMINE, ondansetron, oxyCODONE, promethazine, sodium chloride flush  Assesment: She has febrile illness. She has multiple potential sources. This is being investigated.  She has morbid obesity but  has lost a substantial amount of weight and is doing much better in general  She has hypertension which is well controlled and in fact her blood pressure was somewhat low on admission  She has end-stage renal disease on dialysis and has done well with that  She has diabetes and her blood sugar has been okay Principal Problem:   Fever Active Problems:   Diabetes mellitus ([place])   HTN (hypertension)   UTI (lower urinary tract infection)   ESRD (end stage renal disease) ([place])    Plan: Continue IV antibiotics. Consider getting venous access out depending on results of blood cultures. Continue other treatments. Nephrology consultation is underway.    LOS: 1 day  Latice Waitman L 06/07/2016, 7:22 AM

## 2016-06-07 NOTE — Progress Notes (Signed)
Pharmacy Antibiotic Note  [name] is a 50 y.o. female admitted on 06/06/2016 with Fever, suspected bacteremia.  Pharmacy has been consulted for [place] dosing.  Dr Regenia Skeeter (EDP) D/W Dr Baxter Flattery (ID).  Pt with ESRD requiring dialysis.   Plan:  Cubicin 560m (641mKg/ABW per dose) IV q48hrs (renally adjusted)  Zosyn 2.25gm IV q8h (renally adjusted)  Monitor labs, progress c/s  Duration of therapy per MD  Height: 5' 5" (165.1 cm) Weight: 294 lb 1.5 oz (133.4 kg) IBW/kg (Calculated) : 57  Temp (24hrs), Avg:101.7 F (38.7 C), Min:99 F (37.2 C), Max:103.1 F (39.5 C)   Recent Labs Lab 06/06/16 1700 06/06/16 2024 06/07/16 0424  WBC 22.6*  --  25.6*  CREATININE 6.40*  --  7.46*  LATICACIDVEN 1.8 1.6  --     Estimated Creatinine Clearance: 12.6 mL/min (by C-G formula based on Cr of 7.46).    Allergies  Allergen Reactions  . Contrast Media [Iodinated Diagnostic Agents] Anaphylaxis, Hives and Swelling    Dye for cardiac cath. Tongue swells  . Pineapple Anaphylaxis, Itching and Swelling  . Pneumococcal Vaccines Swelling    Turns skin black, and bodily swelling  . Shellfish Allergy Itching and Swelling    Facial swelling   . Vancomycin Nausea And Vomiting    Infusion "made me feel like I was dying" had to be readmitted to hospital   Anti-infectives    Start     Dose/Rate Route Frequency Ordered Stop   06/08/16 2000  DAPTOmycin (CUBICIN) 500 mg in sodium chloride 0.9 % IVPB     500 mg 220 mL/hr over 30 Minutes Intravenous Every 48 hours 06/07/16 1127     06/07/16 0200  piperacillin-tazobactam (ZOSYN) IVPB 2.25 g     2.25 g 100 mL/hr over 30 Minutes Intravenous Every 8 hours 06/06/16 2031     06/06/16 2100  DAPTOmycin (CUBICIN) 500 mg in sodium chloride 0.9 % IVPB     500 mg 220 mL/hr over 30 Minutes Intravenous  Once 06/06/16 1849 06/06/16 2112   06/06/16 1830  piperacillin-tazobactam (ZOSYN) IVPB 3.375 g     3.375 g 100 mL/hr over 30 Minutes Intravenous   Once 06/06/16 1826 06/06/16 1900     Recent Results (from the past 240 hour(s))  Culture, blood (routine x 2)     Status: None (Preliminary result)   Collection Time: 06/06/16  5:16 PM  Result Value Ref Range Status   Specimen Description BLOOD RIGHT HAND  Final   Special Requests BOTTLES DRAWN AEROBIC AND ANAEROBIC 6CC EACH  Final   Culture NO GROWTH < 24 HOURS  Final   Report Status PENDING  Incomplete  Culture, blood (routine x 2)     Status: None (Preliminary result)   Collection Time: 06/06/16  5:45 PM  Result Value Ref Range Status   Specimen Description BLOOD PORT DRAW  Final   Special Requests BOTTLES DRAWN AEROBIC AND ANAEROBIC 6CC EACH  Final   Culture NO GROWTH < 24 HOURS  Final   Report Status PENDING  Incomplete   Thank you for allowing pharmacy to be a part of this patient's care.  ScHart RobinsonsPharmD Clinical Pharmacist Pager:  [phone]/19/2017 11:34 AM

## 2016-06-07 NOTE — Consult Note (Signed)
Reason for Consult: End-stage renal disease Referring Physician: [name] is an 50 y.o. female.  HPI: She is a patient with history of diabetes, hypertension, sleep apnea and end-stage renal disease on maintenance hemodialysis presently came with complaints of fever, chills and sweating when she was in church yesterday. Patient denies any cough or sputum production. She complains of also generalized ache. She makes occasional urine and she denies any urgency or frequency. Presently she did she denies any difficulty breathing.  Past Medical History  Diagnosis Date  . Chronic pain   . Lymph edema   . Cellulitis   . Chiari malformation     s/p surgery  . S/P colonoscopy 05/26/2007    Dr. Laural Golden sigmoid diverticulosis random biopsies benign  . Hypercholesterolemia   . Hypertension   . S/P endoscopy 05/01/2009    Dr. Penelope Coop pill-induced esophageal ulcerations distal to midesophagus, 2 small ulcers in the antrum of the stomach  . Fibromyalgia   . On home O2   . Obesity hypoventilation syndrome ([place])   . Hx of echocardiogram 10/2011    EF 55-60%  . CHF (congestive heart failure) ([place])   . Shortness of breath dyspnea     with any exertion or if heart rate  is irregular while on dialysis  . Diabetes mellitus     Type II  . Anxiety   . History of blood transfusion     hemorrage duinrg pregancy  . Sleep apnea   . Asthma     as a child  . Pneumonia     in past  . Renal disorder     Pt started dialysis in Dec.2016  . Constipation   . Cancer ([place])     thyroid  . Coronary artery disease     40-50% mid LAD 04/29/09, Medical tx. (Dr. Gwenlyn Found)  . Complication of anesthesia 11/28/15    Resp arrest after  conscious  sedation  . Chiari malformation     Past Surgical History  Procedure Laterality Date  . Cesarean section       x 2  . Abdominal hysterectomy    . Tonsillectomy    . Adenoidectomy    . Thyroidectomy, partial    . Craniectomy suboccipital w/ cervical  laminectomy / chiari    . Portacath placement  07/05/2012    Procedure: INSERTION PORT-A-CATH;  Surgeon: Donato Heinz, MD;  Location: [facility];  Service: General;  Laterality: Left;  subclavian  . Av fistula placement Left 03/15/2016    Procedure: CREATION OF LEFT ARM ARTERIOVENOUS (AV) FISTULA  ;  Surgeon: Rosetta Posner, MD;  Location: [facility];  Service: Vascular;  Laterality: Left;  . .hemodialysis catheter Right 11/28/2015  . Fistula superficialization Left 05/10/2016    Procedure: Left Arm FISTULA SUPERFICIALIZATION;  Surgeon: Rosetta Posner, MD;  Location: [facility];  Service: Vascular;  Laterality: Left;    Family History  Problem Relation Age of Onset  . Colon cancer Mother 22  . Stroke Mother 52  . Coronary artery disease Mother     Social History:  reports that she has never smoked. She has never used smokeless tobacco. She reports that she does not drink alcohol or use illicit drugs.  Allergies:  Allergies  Allergen Reactions  . Contrast Media [Iodinated Diagnostic Agents] Anaphylaxis, Hives and Swelling    Dye for cardiac cath. Tongue swells  . Pineapple Anaphylaxis, Itching and Swelling  . Pneumococcal Vaccines Swelling    Turns skin  black, and bodily swelling  . Shellfish Allergy Itching and Swelling    Facial swelling   . Vancomycin Nausea And Vomiting    Infusion "made me feel like I was dying" had to be readmitted to hospital    Medications: I have reviewed the patient's current medications.  Results for orders placed or performed during the hospital encounter of 06/06/16 (from the past 48 hour(s))  Comprehensive metabolic panel     Status: Abnormal   Collection Time: 06/06/16  5:00 PM  Result Value Ref Range   Sodium 131 (L) 135 - 145 mmol/L   Potassium 4.4 3.5 - 5.1 mmol/L   Chloride 98 (L) 101 - 111 mmol/L   CO2 22 22 - 32 mmol/L   Glucose, Bld 164 (H) 65 - 99 mg/dL   BUN 48 (H) 6 - 20 mg/dL   Creatinine, Ser 6.40 (H) 0.44 - 1.00 mg/dL   Calcium 9.3 8.9 - 10.3  mg/dL   Total Protein 8.7 (H) 6.5 - 8.1 g/dL   Albumin 4.1 3.5 - 5.0 g/dL   AST 31 15 - 41 U/L   ALT 30 14 - 54 U/L   Alkaline Phosphatase 131 (H) 38 - 126 U/L   Total Bilirubin 0.7 0.3 - 1.2 mg/dL   GFR calc non Af Amer 7 (L) >60 mL/min   GFR calc Af Amer 8 (L) >60 mL/min    Comment: (NOTE) The eGFR has been calculated using the CKD EPI equation. This calculation has not been validated in all clinical situations. eGFR's persistently <60 mL/min signify possible Chronic Kidney Disease.    Anion gap 11 5 - 15  CBC with Differential     Status: Abnormal   Collection Time: 06/06/16  5:00 PM  Result Value Ref Range   WBC 22.6 (H) 4.0 - 10.5 K/uL   RBC 4.28 3.87 - 5.11 MIL/uL   Hemoglobin 13.1 12.0 - 15.0 g/dL   HCT 39.9 36.0 - 46.0 %   MCV 93.2 78.0 - 100.0 fL   MCH 30.6 26.0 - 34.0 pg   MCHC 32.8 30.0 - 36.0 g/dL   RDW 17.4 (H) 11.5 - 15.5 %   Platelets 302 150 - 400 K/uL   Neutrophils Relative % 91 %   Neutro Abs 20.7 (H) 1.7 - 7.7 K/uL   Lymphocytes Relative 5 %   Lymphs Abs 1.1 0.7 - 4.0 K/uL   Monocytes Relative 3 %   Monocytes Absolute 0.7 0.1 - 1.0 K/uL   Eosinophils Relative 1 %   Eosinophils Absolute 0.1 0.0 - 0.7 K/uL   Basophils Relative 0 %   Basophils Absolute 0.0 0.0 - 0.1 K/uL  Lactic acid, plasma     Status: None   Collection Time: 06/06/16  5:00 PM  Result Value Ref Range   Lactic Acid, Venous 1.8 0.5 - 2.0 mmol/L  Culture, blood (routine x 2)     Status: None (Preliminary result)   Collection Time: 06/06/16  5:16 PM  Result Value Ref Range   Specimen Description BLOOD RIGHT HAND    Special Requests BOTTLES DRAWN AEROBIC AND ANAEROBIC 6CC EACH    Culture PENDING    Report Status PENDING   Urinalysis, Routine w reflex microscopic     Status: Abnormal   Collection Time: 06/06/16  5:21 PM  Result Value Ref Range   Color, Urine YELLOW YELLOW   APPearance CLEAR CLEAR   Specific Gravity, Urine >1.030 (H) 1.005 - 1.030   pH 5.0 5.0 - 8.0  Glucose, UA  NEGATIVE NEGATIVE mg/dL   Hgb urine dipstick NEGATIVE NEGATIVE   Bilirubin Urine SMALL (A) NEGATIVE   Ketones, ur TRACE (A) NEGATIVE mg/dL   Protein, ur 100 (A) NEGATIVE mg/dL   Nitrite NEGATIVE NEGATIVE   Leukocytes, UA NEGATIVE NEGATIVE  Urine microscopic-add on     Status: Abnormal   Collection Time: 06/06/16  5:21 PM  Result Value Ref Range   Squamous Epithelial / LPF 0-5 (A) NONE SEEN   WBC, UA 6-30 0 - 5 WBC/hpf   RBC / HPF 0-5 0 - 5 RBC/hpf   Bacteria, UA MANY (A) NONE SEEN  Culture, blood (routine x 2)     Status: None (Preliminary result)   Collection Time: 06/06/16  5:45 PM  Result Value Ref Range   Specimen Description BLOOD PORT DRAW    Special Requests BOTTLES DRAWN AEROBIC AND ANAEROBIC 6CC EACH    Culture PENDING    Report Status PENDING   Influenza panel by PCR (type A & B, H1N1)     Status: None   Collection Time: 06/06/16  5:45 PM  Result Value Ref Range   Influenza A By PCR NEGATIVE NEGATIVE   Influenza B By PCR NEGATIVE NEGATIVE   H1N1 flu by pcr NOT DETECTED NOT DETECTED    Comment:        The Xpert Flu assay (FDA approved for nasal aspirates or washes and nasopharyngeal swab specimens), is intended as an aid in the diagnosis of influenza and should not be used as a sole basis for treatment.   Lactic acid, plasma     Status: None   Collection Time: 06/06/16  8:24 PM  Result Value Ref Range   Lactic Acid, Venous 1.6 0.5 - 2.0 mmol/L  Troponin I (q 6hr x 3)     Status: None   Collection Time: 06/06/16 10:51 PM  Result Value Ref Range   Troponin I 0.03 <0.031 ng/mL    Comment:        NO INDICATION OF MYOCARDIAL INJURY.   Glucose, capillary     Status: Abnormal   Collection Time: 06/07/16  2:05 AM  Result Value Ref Range   Glucose-Capillary 175 (H) 65 - 99 mg/dL  Troponin I (q 6hr x 3)     Status: None   Collection Time: 06/07/16  4:24 AM  Result Value Ref Range   Troponin I 0.03 <0.031 ng/mL    Comment:        NO INDICATION OF MYOCARDIAL  INJURY.   Sedimentation rate     Status: Abnormal   Collection Time: 06/07/16  4:24 AM  Result Value Ref Range   Sed Rate 55 (H) 0 - 22 mm/hr  Comprehensive metabolic panel     Status: Abnormal   Collection Time: 06/07/16  4:24 AM  Result Value Ref Range   Sodium 133 (L) 135 - 145 mmol/L   Potassium 4.5 3.5 - 5.1 mmol/L   Chloride 101 101 - 111 mmol/L   CO2 22 22 - 32 mmol/L   Glucose, Bld 164 (H) 65 - 99 mg/dL   BUN 56 (H) 6 - 20 mg/dL   Creatinine, Ser 7.46 (H) 0.44 - 1.00 mg/dL   Calcium 8.3 (L) 8.9 - 10.3 mg/dL   Total Protein 6.9 6.5 - 8.1 g/dL   Albumin 3.1 (L) 3.5 - 5.0 g/dL   AST 22 15 - 41 U/L   ALT 22 14 - 54 U/L   Alkaline Phosphatase 91 38 - 126  U/L   Total Bilirubin 1.1 0.3 - 1.2 mg/dL   GFR calc non Af Amer 6 (L) >60 mL/min   GFR calc Af Amer 7 (L) >60 mL/min    Comment: (NOTE) The eGFR has been calculated using the CKD EPI equation. This calculation has not been validated in all clinical situations. eGFR's persistently <60 mL/min signify possible Chronic Kidney Disease.    Anion gap 10 5 - 15    Dg Chest 2 View  06/06/2016  CLINICAL DATA:  Fever, body aches. EXAM: CHEST  2 VIEW COMPARISON:  04/07/2016 FINDINGS: Large bore right internal jugular approach central venous catheter, and left subclavian approach injectable port are stable in position. The cardiac silhouette is enlarged. Mediastinal contours appear intact. There is no evidence of focal airspace consolidation, pleural effusion or pneumothorax. There is mild interstitial pulmonary edema. Osseous structures are without acute abnormality. Soft tissues are grossly normal. IMPRESSION: Enlarged cardiac silhouette with mild interstitial pulmonary edema. Electronically Signed   By: Fidela Salisbury M.D.   On: 06/06/2016 18:16   Ct Chest Wo Contrast  06/06/2016  CLINICAL DATA:  Chills with body aches and generalized abdominal pain. Increasing shortness of breath beginning this afternoon. Dialysis yesterday.  EXAM: CT CHEST, ABDOMEN AND PELVIS WITHOUT CONTRAST TECHNIQUE: Multidetector CT imaging of the chest, abdomen and pelvis was performed following the standard protocol without IV contrast. COMPARISON:  CT abdomen/ pelvis 12/08/2015 and 12/02/2014 as well as CT chest 11/06/2011 FINDINGS: CT CHEST Lungs are well inflated without consolidation or effusion. Minimal linear scarring over the lingula. Airways are normal. Left subclavian Port-A-Cath has tip over the SVC. Right IJ dialysis catheter has tip over the right atrium. Mild cardiomegaly. Mediastinal structures otherwise within normal. No hilar or mediastinal adenopathy. CT ABDOMEN AND PELVIS The liver, spleen, pancreas and adrenal glands are within normal. Gallbladder is slightly contracted as there is a small focus of air over the nondependent portion of the gallbladder. Subtle hazy attenuation of the adjacent fat as cannot exclude an inflammatory process involving the gallbladder. Kidneys are normal in size without hydronephrosis or nephrolithiasis. Ureters are within normal. Vascular structures are unremarkable. The stomach is within normal. Small bowel is within normal. Appendix is not well visualized. Minimal diverticulosis of the colon. Mesentery is within normal. Small umbilical hernia containing only peritoneal fat unchanged. Pelvic images demonstrate surgical absence of the uterus. The bladder and rectum are within normal. Right ovary is smaller measuring 5 cm. Left ovary not visualized. Remaining bones soft tissues are within normal. IMPRESSION: No acute cardiopulmonary disease. Small focus of air within the nondependent portion of the gallbladder. Gallbladder slightly contracted with hazy attenuation of the adjacent fat as cannot exclude an acute inflammatory or infectious process. Consider right upper quadrant ultrasound for further evaluation. Minimal diverticulosis of the colon. Small umbilical hernia containing only mesenteric fat. Mild stable  cardiomegaly. Electronically Signed   By: Marin Olp M.D.   On: 06/06/2016 20:29   Ct Renal Stone Study  06/06/2016  CLINICAL DATA:  Chills with body aches and generalized abdominal pain. Increasing shortness of breath beginning this afternoon. Dialysis yesterday. EXAM: CT CHEST, ABDOMEN AND PELVIS WITHOUT CONTRAST TECHNIQUE: Multidetector CT imaging of the chest, abdomen and pelvis was performed following the standard protocol without IV contrast. COMPARISON:  CT abdomen/ pelvis 12/08/2015 and 12/02/2014 as well as CT chest 11/06/2011 FINDINGS: CT CHEST Lungs are well inflated without consolidation or effusion. Minimal linear scarring over the lingula. Airways are normal. Left subclavian Port-A-Cath has tip over  the SVC. Right IJ dialysis catheter has tip over the right atrium. Mild cardiomegaly. Mediastinal structures otherwise within normal. No hilar or mediastinal adenopathy. CT ABDOMEN AND PELVIS The liver, spleen, pancreas and adrenal glands are within normal. Gallbladder is slightly contracted as there is a small focus of air over the nondependent portion of the gallbladder. Subtle hazy attenuation of the adjacent fat as cannot exclude an inflammatory process involving the gallbladder. Kidneys are normal in size without hydronephrosis or nephrolithiasis. Ureters are within normal. Vascular structures are unremarkable. The stomach is within normal. Small bowel is within normal. Appendix is not well visualized. Minimal diverticulosis of the colon. Mesentery is within normal. Small umbilical hernia containing only peritoneal fat unchanged. Pelvic images demonstrate surgical absence of the uterus. The bladder and rectum are within normal. Right ovary is smaller measuring 5 cm. Left ovary not visualized. Remaining bones soft tissues are within normal. IMPRESSION: No acute cardiopulmonary disease. Small focus of air within the nondependent portion of the gallbladder. Gallbladder slightly contracted with hazy  attenuation of the adjacent fat as cannot exclude an acute inflammatory or infectious process. Consider right upper quadrant ultrasound for further evaluation. Minimal diverticulosis of the colon. Small umbilical hernia containing only mesenteric fat. Mild stable cardiomegaly. Electronically Signed   By: Marin Olp M.D.   On: 06/06/2016 20:29    Review of Systems  Constitutional: Positive for fever and chills.  Respiratory: Negative for cough, sputum production and shortness of breath.   Cardiovascular: Negative for orthopnea and leg swelling.  Gastrointestinal: Negative for nausea and vomiting.  Musculoskeletal:       Generalized body ache   Blood pressure 91/52, pulse 74, temperature 99.7 F (37.6 C), temperature source Oral, resp. rate 28, height _0  (1.651 m), weight 133.4 kg (294 lb 1.5 oz), SpO2 97 %. Physical Exam  Constitutional: She is oriented to person, place, and time. No distress.  Neck: No JVD present.  Cardiovascular: Normal rate and regular rhythm.   No murmur heard. Respiratory: No respiratory distress. She has no wheezes. She has no rales.  GI: She exhibits no distension. There is no tenderness.  Musculoskeletal: She exhibits no edema.  Neurological: She is alert and oriented to person, place, and time.    Assessment/Plan: Problem #1 history of fever or chills. Source of infection at this moment not clear. When she was in emergency room temperature was 13.1 and her white blood cell count was 22.6. Source of infection could be her tunneled catheter/permacath/ UTI. Patient is on antibiotics and blood culture is pending. Problem #2 end-stage renal disease: She is status post hemodialysis on Saturday. Presently patient doesn't have any uremic signs and symptoms and also no difficulty breathing. Problem 3 history of sleep apnea Problem #4 history of hypertension: Her blood pressure seems to be somewhat low and that could be because of her infection. Problem #5 anemia:  Her hemoglobin is above target goal Problem #6 metabolic bone disease: Her calcium is range Problem #7 history of diabetes Plan: 1]Patient does require dialysis today 2]We'll dialyze patient in the morning. 3]We'll check her basic metabolic panel and CBC in the morning. 4]Possibly would repeat her blood culture during dialysis.  Daleen Steinhaus S 06/07/2016, 7:20 AM

## 2016-06-08 ENCOUNTER — Inpatient Hospital Stay (HOSPITAL_COMMUNITY): Payer: Medicare Other

## 2016-06-08 LAB — GLUCOSE, CAPILLARY
GLUCOSE-CAPILLARY: 93 mg/dL (ref 65–99)
GLUCOSE-CAPILLARY: 75 mg/dL (ref 65–99)
GLUCOSE-CAPILLARY: 60 mg/dL — AB (ref 65–99)
GLUCOSE-CAPILLARY: 87 mg/dL (ref 65–99)

## 2016-06-08 LAB — RENAL FUNCTION PANEL
ALBUMIN: 2.9 g/dL — AB (ref 3.5–5.0)
Anion gap: 11 (ref 5–15)
BUN: 66 mg/dL — AB (ref 6–20)
CALCIUM: 8.3 mg/dL — AB (ref 8.9–10.3)
CHLORIDE: 101 mmol/L (ref 101–111)
CO2: 21 mmol/L — ABNORMAL LOW (ref 22–32)
CREATININE: 8.04 mg/dL — AB (ref 0.44–1.00)
GFR calc Af Amer: 6 mL/min — ABNORMAL LOW (ref >60)
GFR, EST NON AFRICAN AMERICAN: 5 mL/min — AB (ref >60)
Glucose, Bld: 92 mg/dL (ref 65–99)
PHOSPHORUS: 7.8 mg/dL — AB (ref 2.5–4.6)
Potassium: 4.3 mmol/L (ref 3.5–5.1)
SODIUM: 133 mmol/L — AB (ref 135–145)

## 2016-06-08 LAB — CBC
HEMATOCRIT: 29.3 % — AB (ref 36.0–46.0)
HEMOGLOBIN: 9.9 g/dL — AB (ref 12.0–15.0)
MCH: 31.3 pg (ref 26.0–34.0)
MCHC: 33.8 g/dL (ref 30.0–36.0)
MCV: 92.7 fL (ref 78.0–100.0)
PLATELETS: 242 10*3/uL (ref 150–400)
RBC: 3.16 MIL/uL — AB (ref 3.87–5.11)
RDW: 17.8 % — ABNORMAL HIGH (ref 11.5–15.5)
WBC: 9.9 10*3/uL (ref 4.0–10.5)

## 2016-06-08 MED ORDER — SODIUM CHLORIDE 0.9 % IV SOLN: 100.0000 mL | INTRAVENOUS | Status: DC | PRN

## 2016-06-08 MED ORDER — LIDOCAINE-PRILOCAINE 2.5-2.5 % EX CREA: 1.0000 "application " | TOPICAL_CREAM | CUTANEOUS | Status: DC | PRN

## 2016-06-08 MED ORDER — PENTAFLUOROPROP-TETRAFLUOROETH EX AERO: 1.0000 "application " | INHALATION_SPRAY | CUTANEOUS | Status: DC | PRN

## 2016-06-08 MED ORDER — SEVELAMER CARBONATE 800 MG PO TABS: 800.0000 mg | ORAL_TABLET | Freq: Two times a day (BID) | ORAL | Status: DC | PRN

## 2016-06-08 MED ORDER — SUCROFERRIC OXYHYDROXIDE 500 MG PO CHEW: 1000.0000 mg | CHEWABLE_TABLET | Freq: Every day | ORAL | Status: DC

## 2016-06-08 MED ORDER — LIDOCAINE HCL (PF) 1 % IJ SOLN: 5.0000 mL | INTRAMUSCULAR | Status: DC | PRN

## 2016-06-08 MED ORDER — SEVELAMER CARBONATE 800 MG PO TABS
2400.0000 mg | ORAL_TABLET | Freq: Three times a day (TID) | ORAL | Status: DC
  Administered 2016-06-08 – 2016-06-11 (×9): 2400 mg via ORAL
  Filled 2016-06-08 (×9): qty 3

## 2016-06-08 MED ORDER — ALTEPLASE 2 MG IJ SOLR: 2.0000 mg | Freq: Once | INTRAMUSCULAR | Status: DC | PRN

## 2016-06-08 MED ORDER — HEPARIN SODIUM (PORCINE) 1000 UNIT/ML DIALYSIS: 1000.0000 [IU] | INTRAMUSCULAR | Status: DC | PRN

## 2016-06-08 NOTE — Procedures (Signed)
   HEMODIALYSIS TREATMENT NOTE:  4.5 hour heparin-free dialysis completed via right IJ tunneled catheter. Exit site unremarkable. Goal met; 1 liter removed without interruption in ultrafiltration. All blood was returned.  Levophed weaned during HD tx by primary RN.  Pt hemodynamically stable.  Report given to Lawrence Hylton, RN.  Angela Poteat, RN, CDN 

## 2016-06-08 NOTE — Progress Notes (Signed)
Finished dialysis. Been off levophed for over 1 hour systolic still 237.

## 2016-06-08 NOTE — Progress Notes (Signed)
Subjective: She says she feels better. Both of her blood cultures are positive for what appears to be Staphylococcus but full identification is still pending. Blood culture ID panel (reflexed) shows Staphylococcus species but not Staphylococcus aureus. As mentioned for the identification is still pending. Her white blood count is better. She says she feels better.  Objective: Vital signs in last 24 hours: Temp:  [97.3 F (36.3 C)-98.8 F (37.1 C)] 97.3 F (36.3 C) (06/20 0400) Pulse Rate:  [60-77] 62 (06/20 0600) Resp:  [12-29] 15 (06/20 0600) BP: (77-115)/(45-70) 92/60 mmHg (06/20 0600) SpO2:  [94 %-100 %] 98 % (06/20 0600) Weight:  [138.1 kg (304 lb 7.3 oz)] 138.1 kg (304 lb 7.3 oz) (06/20 0600) Weight change: 16.082 kg (35 lb 7.3 oz) Last BM Date: 06/05/16  Intake/Output from previous day: 06/19 0701 - 06/20 0700 In: 1885 [P.O.:720; I.V.:1065; IV Piggyback:100] Out: 250 [Urine:250]  PHYSICAL EXAM General appearance: alert, cooperative, mild distress and morbidly obese Resp: clear to auscultation bilaterally Cardio: regular rate and rhythm, S1, S2 normal, no murmur, click, rub or gallop GI: soft, non-tender; bowel sounds normal; no masses,  no organomegaly Extremities: She still has significant lymphedema but does not show evidence of cellulitis  Lab Results:  Results for orders placed or performed during the hospital encounter of 06/06/16 (from the past 48 hour(s))  TSH     Status: None   Collection Time: 06/06/16  4:45 PM  Result Value Ref Range   TSH 1.402 0.350 - 4.500 uIU/mL  Comprehensive metabolic panel     Status: Abnormal   Collection Time: 06/06/16  5:00 PM  Result Value Ref Range   Sodium 131 (L) 135 - 145 mmol/L   Potassium 4.4 3.5 - 5.1 mmol/L   Chloride 98 (L) 101 - 111 mmol/L   CO2 22 22 - 32 mmol/L   Glucose, Bld 164 (H) 65 - 99 mg/dL   BUN 48 (H) 6 - 20 mg/dL   Creatinine, Ser 6.40 (H) 0.44 - 1.00 mg/dL   Calcium 9.3 8.9 - 10.3 mg/dL   Total Protein  8.7 (H) 6.5 - 8.1 g/dL   Albumin 4.1 3.5 - 5.0 g/dL   AST 31 15 - 41 U/L   ALT 30 14 - 54 U/L   Alkaline Phosphatase 131 (H) 38 - 126 U/L   Total Bilirubin 0.7 0.3 - 1.2 mg/dL   GFR calc non Af Amer 7 (L) >60 mL/min   GFR calc Af Amer 8 (L) >60 mL/min    Comment: (NOTE) The eGFR has been calculated using the CKD EPI equation. This calculation has not been validated in all clinical situations. eGFR's persistently <60 mL/min signify possible Chronic Kidney Disease.    Anion gap 11 5 - 15  CBC with Differential     Status: Abnormal   Collection Time: 06/06/16  5:00 PM  Result Value Ref Range   WBC 22.6 (H) 4.0 - 10.5 K/uL   RBC 4.28 3.87 - 5.11 MIL/uL   Hemoglobin 13.1 12.0 - 15.0 g/dL   HCT 39.9 36.0 - 46.0 %   MCV 93.2 78.0 - 100.0 fL   MCH 30.6 26.0 - 34.0 pg   MCHC 32.8 30.0 - 36.0 g/dL   RDW 17.4 (H) 11.5 - 15.5 %   Platelets 302 150 - 400 K/uL   Neutrophils Relative % 91 %   Neutro Abs 20.7 (H) 1.7 - 7.7 K/uL   Lymphocytes Relative 5 %   Lymphs Abs 1.1 0.7 - 4.0 K/uL  Monocytes Relative 3 %   Monocytes Absolute 0.7 0.1 - 1.0 K/uL   Eosinophils Relative 1 %   Eosinophils Absolute 0.1 0.0 - 0.7 K/uL   Basophils Relative 0 %   Basophils Absolute 0.0 0.0 - 0.1 K/uL  Lactic acid, plasma     Status: None   Collection Time: 06/06/16  5:00 PM  Result Value Ref Range   Lactic Acid, Venous 1.8 0.5 - 2.0 mmol/L  Culture, blood (routine x 2)     Status: None (Preliminary result)   Collection Time: 06/06/16  5:16 PM  Result Value Ref Range   Specimen Description BLOOD RIGHT HAND    Special Requests BOTTLES DRAWN AEROBIC AND ANAEROBIC 6CC EACH    Culture  Setup Time      GRAM POSITIVE COCCI IN CLUSTERS RECOVERED FROM BOTH BOTTLES Gram Stain Report Called to,Read Back By and Verified With: [place]. AT 1310 ON 06/07/2016 BY BAUGHAM,M. Performed at [facility] ID to follow Performed at [facility]    Culture PENDING    Report Status PENDING    Blood Culture ID Panel (Reflexed)     Status: Abnormal   Collection Time: 06/06/16  5:16 PM  Result Value Ref Range   Enterococcus species NOT DETECTED NOT DETECTED   Vancomycin resistance NOT DETECTED NOT DETECTED   Listeria monocytogenes NOT DETECTED NOT DETECTED   Staphylococcus species DETECTED (A) NOT DETECTED    Comment: CRITICAL RESULT CALLED TO, READ BACK BY AND VERIFIED WITH: F.SMITH 06/07/16 _0  BY V.WILKINS    Staphylococcus aureus NOT DETECTED NOT DETECTED   Methicillin resistance NOT DETECTED NOT DETECTED   Streptococcus species NOT DETECTED NOT DETECTED   Streptococcus agalactiae NOT DETECTED NOT DETECTED   Streptococcus pneumoniae NOT DETECTED NOT DETECTED   Streptococcus pyogenes NOT DETECTED NOT DETECTED   Acinetobacter baumannii NOT DETECTED NOT DETECTED   Enterobacteriaceae species NOT DETECTED NOT DETECTED   Enterobacter cloacae complex NOT DETECTED NOT DETECTED   Escherichia coli NOT DETECTED NOT DETECTED   Klebsiella oxytoca NOT DETECTED NOT DETECTED   Klebsiella pneumoniae NOT DETECTED NOT DETECTED   Proteus species NOT DETECTED NOT DETECTED   Serratia marcescens NOT DETECTED NOT DETECTED   Carbapenem resistance NOT DETECTED NOT DETECTED   Haemophilus influenzae NOT DETECTED NOT DETECTED   Neisseria meningitidis NOT DETECTED NOT DETECTED   Pseudomonas aeruginosa NOT DETECTED NOT DETECTED   Candida albicans NOT DETECTED NOT DETECTED   Candida glabrata NOT DETECTED NOT DETECTED   Candida krusei NOT DETECTED NOT DETECTED   Candida parapsilosis NOT DETECTED NOT DETECTED   Candida tropicalis NOT DETECTED NOT DETECTED    Comment: Performed at [facility]  Urinalysis, Routine w reflex microscopic     Status: Abnormal   Collection Time: 06/06/16  5:21 PM  Result Value Ref Range   Color, Urine YELLOW YELLOW   APPearance CLEAR CLEAR   Specific Gravity, Urine >1.030 (H) 1.005 - 1.030   pH 5.0 5.0 - 8.0   Glucose, UA NEGATIVE NEGATIVE mg/dL   Hgb urine  dipstick NEGATIVE NEGATIVE   Bilirubin Urine SMALL (A) NEGATIVE   Ketones, ur TRACE (A) NEGATIVE mg/dL   Protein, ur 100 (A) NEGATIVE mg/dL   Nitrite NEGATIVE NEGATIVE   Leukocytes, UA NEGATIVE NEGATIVE  Urine microscopic-add on     Status: Abnormal   Collection Time: 06/06/16  5:21 PM  Result Value Ref Range   Squamous Epithelial / LPF 0-5 (A) NONE SEEN   WBC, UA 6-30 0 -  5 WBC/hpf   RBC / HPF 0-5 0 - 5 RBC/hpf   Bacteria, UA MANY (A) NONE SEEN  Culture, blood (routine x 2)     Status: None (Preliminary result)   Collection Time: 06/06/16  5:45 PM  Result Value Ref Range   Specimen Description BLOOD PORT DRAW    Special Requests BOTTLES DRAWN AEROBIC AND ANAEROBIC 6CC EACH    Culture  Setup Time      GRAM POSITIVE COCCI IN CLUSTERS RECOVERED FROM BOTH BOTTLES Gram Stain Report Called to,Read Back By and Verified With: [place]. AT 1310 ON 06/07/2016 BY BAUGHAM,M. Performed at [facility]    Culture PENDING    Report Status PENDING   Influenza panel by PCR (type A & B, H1N1)     Status: None   Collection Time: 06/06/16  5:45 PM  Result Value Ref Range   Influenza A By PCR NEGATIVE NEGATIVE   Influenza B By PCR NEGATIVE NEGATIVE   H1N1 flu by pcr NOT DETECTED NOT DETECTED    Comment:        The Xpert Flu assay (FDA approved for nasal aspirates or washes and nasopharyngeal swab specimens), is intended as an aid in the diagnosis of influenza and should not be used as a sole basis for treatment.   Lactic acid, plasma     Status: None   Collection Time: 06/06/16  8:24 PM  Result Value Ref Range   Lactic Acid, Venous 1.6 0.5 - 2.0 mmol/L  Troponin I (q 6hr x 3)     Status: None   Collection Time: 06/06/16 10:51 PM  Result Value Ref Range   Troponin I 0.03 <0.031 ng/mL    Comment:        NO INDICATION OF MYOCARDIAL INJURY.   MRSA PCR Screening     Status: None   Collection Time: 06/07/16  2:00 AM  Result Value Ref Range   MRSA by PCR NEGATIVE NEGATIVE     Comment:        The GeneXpert MRSA Assay (FDA approved for NASAL specimens only), is one component of a comprehensive MRSA colonization surveillance program. It is not intended to diagnose MRSA infection nor to guide or monitor treatment for MRSA infections.   Glucose, capillary     Status: Abnormal   Collection Time: 06/07/16  2:05 AM  Result Value Ref Range   Glucose-Capillary 175 (H) 65 - 99 mg/dL  Gamma GT     Status: Abnormal   Collection Time: 06/07/16  4:24 AM  Result Value Ref Range   GGT 125 (H) 7 - 50 U/L    Comment: Performed at [facility]  Troponin I (q 6hr x 3)     Status: None   Collection Time: 06/07/16  4:24 AM  Result Value Ref Range   Troponin I 0.03 <0.031 ng/mL    Comment:        NO INDICATION OF MYOCARDIAL INJURY.   Sedimentation rate     Status: Abnormal   Collection Time: 06/07/16  4:24 AM  Result Value Ref Range   Sed Rate 55 (H) 0 - 22 mm/hr  Comprehensive metabolic panel     Status: Abnormal   Collection Time: 06/07/16  4:24 AM  Result Value Ref Range   Sodium 133 (L) 135 - 145 mmol/L   Potassium 4.5 3.5 - 5.1 mmol/L   Chloride 101 101 - 111 mmol/L   CO2 22 22 - 32 mmol/L   Glucose, Bld 164 (  H) 65 - 99 mg/dL   BUN 56 (H) 6 - 20 mg/dL   Creatinine, Ser 7.46 (H) 0.44 - 1.00 mg/dL   Calcium 8.3 (L) 8.9 - 10.3 mg/dL   Total Protein 6.9 6.5 - 8.1 g/dL   Albumin 3.1 (L) 3.5 - 5.0 g/dL   AST 22 15 - 41 U/L   ALT 22 14 - 54 U/L   Alkaline Phosphatase 91 38 - 126 U/L   Total Bilirubin 1.1 0.3 - 1.2 mg/dL   GFR calc non Af Amer 6 (L) >60 mL/min   GFR calc Af Amer 7 (L) >60 mL/min    Comment: (NOTE) The eGFR has been calculated using the CKD EPI equation. This calculation has not been validated in all clinical situations. eGFR's persistently <60 mL/min signify possible Chronic Kidney Disease.    Anion gap 10 5 - 15  CBC     Status: Abnormal   Collection Time: 06/07/16  4:24 AM  Result Value Ref Range   WBC 25.6 (H) 4.0 - 10.5 K/uL    RBC 3.46 (L) 3.87 - 5.11 MIL/uL   Hemoglobin 10.4 (L) 12.0 - 15.0 g/dL    Comment: RESULT REPEATED AND VERIFIED DELTA CHECK NOTED    HCT 32.4 (L) 36.0 - 46.0 %   MCV 93.6 78.0 - 100.0 fL   MCH 30.1 26.0 - 34.0 pg   MCHC 32.1 30.0 - 36.0 g/dL   RDW 17.5 (H) 11.5 - 15.5 %   Platelets 264 150 - 400 K/uL  Glucose, capillary     Status: Abnormal   Collection Time: 06/07/16  7:40 AM  Result Value Ref Range   Glucose-Capillary 177 (H) 65 - 99 mg/dL   Comment 1 Notify RN    Comment 2 Document in Chart   Troponin I (q 6hr x 3)     Status: None   Collection Time: 06/07/16  9:20 AM  Result Value Ref Range   Troponin I 0.03 <0.031 ng/mL    Comment:        NO INDICATION OF MYOCARDIAL INJURY.   Glucose, capillary     Status: Abnormal   Collection Time: 06/07/16 11:27 AM  Result Value Ref Range   Glucose-Capillary 169 (H) 65 - 99 mg/dL   Comment 1 Notify RN    Comment 2 Document in Chart   Glucose, capillary     Status: None   Collection Time: 06/07/16  4:08 PM  Result Value Ref Range   Glucose-Capillary 92 65 - 99 mg/dL   Comment 1 Notify RN    Comment 2 Document in Chart   Glucose, capillary     Status: Abnormal   Collection Time: 06/07/16  9:23 PM  Result Value Ref Range   Glucose-Capillary 126 (H) 65 - 99 mg/dL  CBC     Status: Abnormal   Collection Time: 06/08/16  5:13 AM  Result Value Ref Range   WBC 9.9 4.0 - 10.5 K/uL   RBC 3.16 (L) 3.87 - 5.11 MIL/uL   Hemoglobin 9.9 (L) 12.0 - 15.0 g/dL   HCT 29.3 (L) 36.0 - 46.0 %   MCV 92.7 78.0 - 100.0 fL   MCH 31.3 26.0 - 34.0 pg   MCHC 33.8 30.0 - 36.0 g/dL   RDW 17.8 (H) 11.5 - 15.5 %   Platelets 242 150 - 400 K/uL  Renal function panel     Status: Abnormal   Collection Time: 06/08/16  5:13 AM  Result Value Ref Range   Sodium  133 (L) 135 - 145 mmol/L   Potassium 4.3 3.5 - 5.1 mmol/L   Chloride 101 101 - 111 mmol/L   CO2 21 (L) 22 - 32 mmol/L   Glucose, Bld 92 65 - 99 mg/dL   BUN 66 (H) 6 - 20 mg/dL   Creatinine, Ser  8.04 (H) 0.44 - 1.00 mg/dL   Calcium 8.3 (L) 8.9 - 10.3 mg/dL   Phosphorus 7.8 (H) 2.5 - 4.6 mg/dL   Albumin 2.9 (L) 3.5 - 5.0 g/dL   GFR calc non Af Amer 5 (L) >60 mL/min   GFR calc Af Amer 6 (L) >60 mL/min    Comment: (NOTE) The eGFR has been calculated using the CKD EPI equation. This calculation has not been validated in all clinical situations. eGFR's persistently <60 mL/min signify possible Chronic Kidney Disease.    Anion gap 11 5 - 15  Glucose, capillary     Status: None   Collection Time: 06/08/16  7:35 AM  Result Value Ref Range   Glucose-Capillary 93 65 - 99 mg/dL   Comment 1 Notify RN    Comment 2 Document in Chart     ABGS No results for input(s): PHART, PO2ART, TCO2, HCO3 in the last 72 hours.  Invalid input(s): PCO2 CULTURES Recent Results (from the past 240 hour(s))  Culture, blood (routine x 2)     Status: None (Preliminary result)   Collection Time: 06/06/16  5:16 PM  Result Value Ref Range Status   Specimen Description BLOOD RIGHT HAND  Final   Special Requests BOTTLES DRAWN AEROBIC AND ANAEROBIC [place]  Final   Culture  Setup Time   Final    GRAM POSITIVE COCCI IN CLUSTERS RECOVERED FROM BOTH BOTTLES Gram Stain Report Called to,Read Back By and Verified With: [place]. AT 2979 ON 06/07/2016 BY BAUGHAM,M. Performed at [facility] ID to follow Performed at [facility]    Culture PENDING  Incomplete   Report Status PENDING  Incomplete  Blood Culture ID Panel (Reflexed)     Status: Abnormal   Collection Time: 06/06/16  5:16 PM  Result Value Ref Range Status   Enterococcus species NOT DETECTED NOT DETECTED Final   Vancomycin resistance NOT DETECTED NOT DETECTED Final   Listeria monocytogenes NOT DETECTED NOT DETECTED Final   Staphylococcus species DETECTED (A) NOT DETECTED Final    Comment: CRITICAL RESULT CALLED TO, READ BACK BY AND VERIFIED WITH: F.SMITH 06/07/16 _0  BY V.WILKINS    Staphylococcus aureus NOT DETECTED  NOT DETECTED Final   Methicillin resistance NOT DETECTED NOT DETECTED Final   Streptococcus species NOT DETECTED NOT DETECTED Final   Streptococcus agalactiae NOT DETECTED NOT DETECTED Final   Streptococcus pneumoniae NOT DETECTED NOT DETECTED Final   Streptococcus pyogenes NOT DETECTED NOT DETECTED Final   Acinetobacter baumannii NOT DETECTED NOT DETECTED Final   Enterobacteriaceae species NOT DETECTED NOT DETECTED Final   Enterobacter cloacae complex NOT DETECTED NOT DETECTED Final   Escherichia coli NOT DETECTED NOT DETECTED Final   Klebsiella oxytoca NOT DETECTED NOT DETECTED Final   Klebsiella pneumoniae NOT DETECTED NOT DETECTED Final   Proteus species NOT DETECTED NOT DETECTED Final   Serratia marcescens NOT DETECTED NOT DETECTED Final   Carbapenem resistance NOT DETECTED NOT DETECTED Final   Haemophilus influenzae NOT DETECTED NOT DETECTED Final   Neisseria meningitidis NOT DETECTED NOT DETECTED Final   Pseudomonas aeruginosa NOT DETECTED NOT DETECTED Final   Candida albicans NOT DETECTED NOT DETECTED Final   Candida glabrata  NOT DETECTED NOT DETECTED Final   Candida krusei NOT DETECTED NOT DETECTED Final   Candida parapsilosis NOT DETECTED NOT DETECTED Final   Candida tropicalis NOT DETECTED NOT DETECTED Final    Comment: Performed at [facility]  Culture, blood (routine x 2)     Status: None (Preliminary result)   Collection Time: 06/06/16  5:45 PM  Result Value Ref Range Status   Specimen Description BLOOD PORT DRAW  Final   Special Requests BOTTLES DRAWN AEROBIC AND ANAEROBIC [place]  Final   Culture  Setup Time   Final    GRAM POSITIVE COCCI IN CLUSTERS RECOVERED FROM BOTH BOTTLES Gram Stain Report Called to,Read Back By and Verified With: [place]. AT 4327 ON 06/07/2016 BY BAUGHAM,M. Performed at [facility]    Culture PENDING  Incomplete   Report Status PENDING  Incomplete  MRSA PCR Screening     Status: None   Collection Time: 06/07/16  2:00 AM   Result Value Ref Range Status   MRSA by PCR NEGATIVE NEGATIVE Final    Comment:        The GeneXpert MRSA Assay (FDA approved for NASAL specimens only), is one component of a comprehensive MRSA colonization surveillance program. It is not intended to diagnose MRSA infection nor to guide or monitor treatment for MRSA infections.    Studies/Results: Dg Chest 2 View  06/06/2016  CLINICAL DATA:  Fever, body aches. EXAM: CHEST  2 VIEW COMPARISON:  04/07/2016 FINDINGS: Large bore right internal jugular approach central venous catheter, and left subclavian approach injectable port are stable in position. The cardiac silhouette is enlarged. Mediastinal contours appear intact. There is no evidence of focal airspace consolidation, pleural effusion or pneumothorax. There is mild interstitial pulmonary edema. Osseous structures are without acute abnormality. Soft tissues are grossly normal. IMPRESSION: Enlarged cardiac silhouette with mild interstitial pulmonary edema. Electronically Signed   By: Fidela Salisbury M.D.   On: 06/06/2016 18:16   Ct Chest Wo Contrast  06/06/2016  CLINICAL DATA:  Chills with body aches and generalized abdominal pain. Increasing shortness of breath beginning this afternoon. Dialysis yesterday. EXAM: CT CHEST, ABDOMEN AND PELVIS WITHOUT CONTRAST TECHNIQUE: Multidetector CT imaging of the chest, abdomen and pelvis was performed following the standard protocol without IV contrast. COMPARISON:  CT abdomen/ pelvis 12/08/2015 and 12/02/2014 as well as CT chest 11/06/2011 FINDINGS: CT CHEST Lungs are well inflated without consolidation or effusion. Minimal linear scarring over the lingula. Airways are normal. Left subclavian Port-A-Cath has tip over the SVC. Right IJ dialysis catheter has tip over the right atrium. Mild cardiomegaly. Mediastinal structures otherwise within normal. No hilar or mediastinal adenopathy. CT ABDOMEN AND PELVIS The liver, spleen, pancreas and adrenal glands  are within normal. Gallbladder is slightly contracted as there is a small focus of air over the nondependent portion of the gallbladder. Subtle hazy attenuation of the adjacent fat as cannot exclude an inflammatory process involving the gallbladder. Kidneys are normal in size without hydronephrosis or nephrolithiasis. Ureters are within normal. Vascular structures are unremarkable. The stomach is within normal. Small bowel is within normal. Appendix is not well visualized. Minimal diverticulosis of the colon. Mesentery is within normal. Small umbilical hernia containing only peritoneal fat unchanged. Pelvic images demonstrate surgical absence of the uterus. The bladder and rectum are within normal. Right ovary is smaller measuring 5 cm. Left ovary not visualized. Remaining bones soft tissues are within normal. IMPRESSION: No acute cardiopulmonary disease. Small focus of air within the nondependent portion  of the gallbladder. Gallbladder slightly contracted with hazy attenuation of the adjacent fat as cannot exclude an acute inflammatory or infectious process. Consider right upper quadrant ultrasound for further evaluation. Minimal diverticulosis of the colon. Small umbilical hernia containing only mesenteric fat. Mild stable cardiomegaly. Electronically Signed   By: Marin Olp M.D.   On: 06/06/2016 20:29   Ct Renal Stone Study  06/06/2016  CLINICAL DATA:  Chills with body aches and generalized abdominal pain. Increasing shortness of breath beginning this afternoon. Dialysis yesterday. EXAM: CT CHEST, ABDOMEN AND PELVIS WITHOUT CONTRAST TECHNIQUE: Multidetector CT imaging of the chest, abdomen and pelvis was performed following the standard protocol without IV contrast. COMPARISON:  CT abdomen/ pelvis 12/08/2015 and 12/02/2014 as well as CT chest 11/06/2011 FINDINGS: CT CHEST Lungs are well inflated without consolidation or effusion. Minimal linear scarring over the lingula. Airways are normal. Left subclavian  Port-A-Cath has tip over the SVC. Right IJ dialysis catheter has tip over the right atrium. Mild cardiomegaly. Mediastinal structures otherwise within normal. No hilar or mediastinal adenopathy. CT ABDOMEN AND PELVIS The liver, spleen, pancreas and adrenal glands are within normal. Gallbladder is slightly contracted as there is a small focus of air over the nondependent portion of the gallbladder. Subtle hazy attenuation of the adjacent fat as cannot exclude an inflammatory process involving the gallbladder. Kidneys are normal in size without hydronephrosis or nephrolithiasis. Ureters are within normal. Vascular structures are unremarkable. The stomach is within normal. Small bowel is within normal. Appendix is not well visualized. Minimal diverticulosis of the colon. Mesentery is within normal. Small umbilical hernia containing only peritoneal fat unchanged. Pelvic images demonstrate surgical absence of the uterus. The bladder and rectum are within normal. Right ovary is smaller measuring 5 cm. Left ovary not visualized. Remaining bones soft tissues are within normal. IMPRESSION: No acute cardiopulmonary disease. Small focus of air within the nondependent portion of the gallbladder. Gallbladder slightly contracted with hazy attenuation of the adjacent fat as cannot exclude an acute inflammatory or infectious process. Consider right upper quadrant ultrasound for further evaluation. Minimal diverticulosis of the colon. Small umbilical hernia containing only mesenteric fat. Mild stable cardiomegaly. Electronically Signed   By: Marin Olp M.D.   On: 06/06/2016 20:29   US Abdomen Limited Ruq  06/07/2016  CLINICAL DATA:  Postpartum fever of unknown etiology EXAM: US ABDOMEN LIMITED - RIGHT UPPER QUADRANT COMPARISON:  Abdominal CT scan dated June 06, 2016 and December 08, 2015. FINDINGS: Gallbladder: The gallbladder is contracted. There is dirty shadowing which may reflect air previously demonstrated in the biliary  tree and gallbladder. No discrete stones are observed. Common bile duct: Diameter: 4.5 mm Liver: The hepatic echotexture is mildly heterogeneous. No definite intra hepatic ductal air collections are observed. IMPRESSION: Limited study revealing a contracted gallbladder. This may contain air and reflect emphysematous cholecystitis. No definite stones are observed on this study. On the CT scan of December 08, 2015 tiny calcifications were noted in the gallbladder likely reflecting stones. No intra or extrahepatic bile duct gas collections are observed. These results will be called to the ordering clinician or representative by the Radiologist Assistant, and communication documented in the PACS or zVision Dashboard. Electronically Signed   By: David  [place] M.D.   On: 06/07/2016 11:00    Medications:  Prior to Admission:  Prescriptions prior to admission  Medication Sig Dispense Refill Last Dose  . ALPRAZolam (XANAX) 0.5 MG tablet Take 0.5 mg by mouth 3 (three) times daily as needed for anxiety  or sleep.    unknown  . ondansetron (ZOFRAN-ODT) 4 MG disintegrating tablet Take 4 mg by mouth every 8 (eight) hours as needed for nausea or vomiting.   unknown  . Oxycodone HCl 10 MG TABS Take 1 tablet (10 mg total) by mouth 3 (three) times daily as needed (pain). 15 tablet 0 unknown  . sevelamer carbonate (RENVELA) 800 MG tablet Take 1,600 mg by mouth 3 (three) times daily with meals. Takes 2 tablets with meals and 1 with snacks     . acetaminophen (TYLENOL) 325 MG tablet Take 650 mg by mouth every 6 (six) hours as needed for moderate pain or fever.      Marland Kitchen albuterol (PROVENTIL) (2.5 MG/3ML) 0.083% nebulizer solution Take 2.5 mg by nebulization every 6 (six) hours as needed for wheezing or shortness of breath.     Marland Kitchen amLODipine (NORVASC) 10 MG tablet Take 10 mg by mouth daily.     Marland Kitchen b complex-vitamin c-folic acid (NEPHRO-VITE) 0.8 MG TABS tablet Take 1 tablet by mouth 3 (three) times daily.      . diphenhydrAMINE  (BENADRYL) 25 mg capsule Take 25 mg by mouth every 6 (six) hours as needed for allergies.     Marland Kitchen insulin NPH-insulin regular (NOVOLIN 70/30) (70-30) 100 UNIT/ML injection Inject 60 Units into the skin 2 (two) times daily. Reported on 03/12/2016     . metoprolol (LOPRESSOR) 50 MG tablet Take 0.5 tablets (25 mg total) by mouth 2 (two) times daily. 30 tablet 12   . promethazine (PHENERGAN) 12.5 MG tablet Take 12.5 mg by mouth every 6 (six) hours as needed for nausea or vomiting (alternates with Zofran).     . Sucroferric Oxyhydroxide (VELPHORO PO) Take 500 mg by mouth daily with breakfast.       Scheduled: . DAPTOmycin (CUBICIN)  IV  500 mg Intravenous Q48H  . heparin  5,000 Units Subcutaneous Q8H  . insulin aspart  0-5 Units Subcutaneous QHS  . insulin aspart  0-9 Units Subcutaneous TID WC  . insulin aspart protamine- aspart  60 Units Subcutaneous BID WC  . metoprolol  25 mg Oral BID  . multivitamin  1 tablet Oral QHS  . piperacillin-tazobactam (ZOSYN)  IV  2.25 g Intravenous Q8H  . sodium chloride flush  3 mL Intravenous Q12H  . sodium chloride flush  3 mL Intravenous Q12H  . sucroferric oxyhydroxide  1,000 mg Oral Q breakfast   Continuous: . sodium chloride 75 mL/hr at 06/08/16 0600  . norepinephrine (LEVOPHED) Adult infusion 4 mcg/min (06/08/16 0500)   ERD:EYCXKG chloride, sodium chloride, sodium chloride, acetaminophen **OR** acetaminophen, acetaminophen, albuterol, ALPRAZolam, alteplase, diphenhydrAMINE, heparin, ondansetron, oxyCODONE, promethazine, sodium chloride flush  Assesment: She was admitted with febrile illness and has positive blood cultures. Her echocardiogram does not show vegetations but is transthoracic. She has a dialysis catheter as well as a Port-A-Cath in place area her blood cultures do not appear to be positive for Staphylococcus aureus.  CT showed questionable air in the area of the gallbladder but she really doesn't have any symptoms related to that. In the past  we've been concerned that she may have cholelithiasis but she was not felt to be an operative candidate. She has now lost a substantial amount of weight and may be a better operative candidate if this needs to be done. She will need to have right upper quadrant ultrasound.  She has end-stage renal disease on dialysis and is going to have dialysis today.  She has diabetes which is  fairly well controlled  She has chronic lymphedema and previous episodes of cellulitis but that does not appear to be the source of her febrile illness this time  Her urinalysis showed white blood cells but negative nitrite culture was not done.  She has morbid obesity but is down substantially Principal Problem:   Fever Active Problems:   Diabetes mellitus (HCC)   HTN (hypertension)   UTI (lower urinary tract infection)   ESRD (end stage renal disease) ([facility])    Plan: Continue current treatment. Ultrasound of gallbladder area. Dialysis today. Continue with IV antibiotics until we have full identification and sensitivities.    LOS: 2 days   Mychele Seyller L 06/08/2016, 8:10 AM

## 2016-06-08 NOTE — Progress Notes (Addendum)
Subjective: Interval History: has no complaint of complaints of fever or chills. Patient is feeling much better. Presently she denies any difficulty breathing..  Objective: Vital signs in last 24 hours: Temp:  [97.3 F (36.3 C)-99 F (37.2 C)] 97.3 F (36.3 C) (06/20 0400) Pulse Rate:  [60-82] 62 (06/20 0600) Resp:  [12-29] 15 (06/20 0600) BP: (77-115)/(45-70) 92/60 mmHg (06/20 0600) SpO2:  [94 %-100 %] 98 % (06/20 0600) Weight:  [138.1 kg (304 lb 7.3 oz)] 138.1 kg (304 lb 7.3 oz) (06/20 0600) Weight change: 16.082 kg (35 lb 7.3 oz)  Intake/Output from previous day: 06/19 0701 - 06/20 0700 In: 1885 [P.O.:720; I.V.:1065; IV Piggyback:100] Out: 250 [Urine:250] Intake/Output this shift:   Generally patient is alert and in no apparent distress Chest: Decreased breath sound bilaterally Heart exam regular rate and rhythm no   extremities she has trace edema/venous stasis.   Lab Results:  Recent Labs  06/07/16 0424 06/08/16 0513  WBC 25.6* 9.9  HGB 10.4* 9.9*  HCT 32.4* 29.3*  PLT 264 242   BMET:  Recent Labs  06/07/16 0424 06/08/16 0513  NA 133* 133*  K 4.5 4.3  CL 101 101  CO2 22 21*  GLUCOSE 164* 92  BUN 56* 66*  CREATININE 7.46* 8.04*  CALCIUM 8.3* 8.3*   No results for input(s): PTH in the last 72 hours. Iron Studies: No results for input(s): IRON, TIBC, TRANSFERRIN, FERRITIN in the last 72 hours.  Studies/Results: Dg Chest 2 View  06/06/2016  CLINICAL DATA:  Fever, body aches. EXAM: CHEST  2 VIEW COMPARISON:  04/07/2016 FINDINGS: Large bore right internal jugular approach central venous catheter, and left subclavian approach injectable port are stable in position. The cardiac silhouette is enlarged. Mediastinal contours appear intact. There is no evidence of focal airspace consolidation, pleural effusion or pneumothorax. There is mild interstitial pulmonary edema. Osseous structures are without acute abnormality. Soft tissues are grossly normal. IMPRESSION:  Enlarged cardiac silhouette with mild interstitial pulmonary edema. Electronically Signed   By: Fidela Salisbury M.D.   On: 06/06/2016 18:16   Ct Chest Wo Contrast  06/06/2016  CLINICAL DATA:  Chills with body aches and generalized abdominal pain. Increasing shortness of breath beginning this afternoon. Dialysis yesterday. EXAM: CT CHEST, ABDOMEN AND PELVIS WITHOUT CONTRAST TECHNIQUE: Multidetector CT imaging of the chest, abdomen and pelvis was performed following the standard protocol without IV contrast. COMPARISON:  CT abdomen/ pelvis 12/08/2015 and 12/02/2014 as well as CT chest 11/06/2011 FINDINGS: CT CHEST Lungs are well inflated without consolidation or effusion. Minimal linear scarring over the lingula. Airways are normal. Left subclavian Port-A-Cath has tip over the SVC. Right IJ dialysis catheter has tip over the right atrium. Mild cardiomegaly. Mediastinal structures otherwise within normal. No hilar or mediastinal adenopathy. CT ABDOMEN AND PELVIS The liver, spleen, pancreas and adrenal glands are within normal. Gallbladder is slightly contracted as there is a small focus of air over the nondependent portion of the gallbladder. Subtle hazy attenuation of the adjacent fat as cannot exclude an inflammatory process involving the gallbladder. Kidneys are normal in size without hydronephrosis or nephrolithiasis. Ureters are within normal. Vascular structures are unremarkable. The stomach is within normal. Small bowel is within normal. Appendix is not well visualized. Minimal diverticulosis of the colon. Mesentery is within normal. Small umbilical hernia containing only peritoneal fat unchanged. Pelvic images demonstrate surgical absence of the uterus. The bladder and rectum are within normal. Right ovary is smaller measuring 5 cm. Left ovary not visualized. Remaining bones  soft tissues are within normal. IMPRESSION: No acute cardiopulmonary disease. Small focus of air within the nondependent portion of  the gallbladder. Gallbladder slightly contracted with hazy attenuation of the adjacent fat as cannot exclude an acute inflammatory or infectious process. Consider right upper quadrant ultrasound for further evaluation. Minimal diverticulosis of the colon. Small umbilical hernia containing only mesenteric fat. Mild stable cardiomegaly. Electronically Signed   By: Marin Olp M.D.   On: 06/06/2016 20:29   Ct Renal Stone Study  06/06/2016  CLINICAL DATA:  Chills with body aches and generalized abdominal pain. Increasing shortness of breath beginning this afternoon. Dialysis yesterday. EXAM: CT CHEST, ABDOMEN AND PELVIS WITHOUT CONTRAST TECHNIQUE: Multidetector CT imaging of the chest, abdomen and pelvis was performed following the standard protocol without IV contrast. COMPARISON:  CT abdomen/ pelvis 12/08/2015 and 12/02/2014 as well as CT chest 11/06/2011 FINDINGS: CT CHEST Lungs are well inflated without consolidation or effusion. Minimal linear scarring over the lingula. Airways are normal. Left subclavian Port-A-Cath has tip over the SVC. Right IJ dialysis catheter has tip over the right atrium. Mild cardiomegaly. Mediastinal structures otherwise within normal. No hilar or mediastinal adenopathy. CT ABDOMEN AND PELVIS The liver, spleen, pancreas and adrenal glands are within normal. Gallbladder is slightly contracted as there is a small focus of air over the nondependent portion of the gallbladder. Subtle hazy attenuation of the adjacent fat as cannot exclude an inflammatory process involving the gallbladder. Kidneys are normal in size without hydronephrosis or nephrolithiasis. Ureters are within normal. Vascular structures are unremarkable. The stomach is within normal. Small bowel is within normal. Appendix is not well visualized. Minimal diverticulosis of the colon. Mesentery is within normal. Small umbilical hernia containing only peritoneal fat unchanged. Pelvic images demonstrate surgical absence of the  uterus. The bladder and rectum are within normal. Right ovary is smaller measuring 5 cm. Left ovary not visualized. Remaining bones soft tissues are within normal. IMPRESSION: No acute cardiopulmonary disease. Small focus of air within the nondependent portion of the gallbladder. Gallbladder slightly contracted with hazy attenuation of the adjacent fat as cannot exclude an acute inflammatory or infectious process. Consider right upper quadrant ultrasound for further evaluation. Minimal diverticulosis of the colon. Small umbilical hernia containing only mesenteric fat. Mild stable cardiomegaly. Electronically Signed   By: Marin Olp M.D.   On: 06/06/2016 20:29   US Abdomen Limited Ruq  06/07/2016  CLINICAL DATA:  Postpartum fever of unknown etiology EXAM: US ABDOMEN LIMITED - RIGHT UPPER QUADRANT COMPARISON:  Abdominal CT scan dated June 06, 2016 and December 08, 2015. FINDINGS: Gallbladder: The gallbladder is contracted. There is dirty shadowing which may reflect air previously demonstrated in the biliary tree and gallbladder. No discrete stones are observed. Common bile duct: Diameter: 4.5 mm Liver: The hepatic echotexture is mildly heterogeneous. No definite intra hepatic ductal air collections are observed. IMPRESSION: Limited study revealing a contracted gallbladder. This may contain air and reflect emphysematous cholecystitis. No definite stones are observed on this study. On the CT scan of December 08, 2015 tiny calcifications were noted in the gallbladder likely reflecting stones. No intra or extrahepatic bile duct gas collections are observed. These results will be called to the ordering clinician or representative by the Radiologist Assistant, and communication documented in the PACS or [facility] Dashboard. Electronically Signed   By: David  Martinique M.D.   On: 06/07/2016 11:00    I have reviewed the patient's current medications.  Assessment/Plan: Problem #1 end-stage renal disease: She is status  post  hemodialysis on Saturday. Presently patient is asymptomatic with normal potassium. Problem #2 fever: Presently patient is afebrile and her white blood cell count is normal. Blood culture is positive for staph species and organism identification and sensitivities pending. Problem #3 metabolic bone disease: Her calcium is in range but phosphorus is high. Presently patient is on Velphoro 500 mg by mouth 3 times a day with meals and one with snack. Patient is also on Renvela Problem #4 anemia: Her hemoglobin is slightly below our target goal Problem #5 hypotension: Possibly a combination of sepsis and antihypertensive medications Problem #6 fluid management: Patient presents today doesn't have significant signs of fluid overload Problem #7 nutrition Plan: 1] We'll DC Velphoro 2 we'll change her Renvela to  800 mg 3 tablets by mouth 3 times a day with meals and one with snack  3] we'll make arrangements for patient to get dialysis today 4] we'll check her CBC and renal panel in the morning 5] we'll DC amlodipine   LOS: 2 days   Letty Salvi S 06/08/2016,7:36 AM   [email or web address]

## 2016-06-09 LAB — GLUCOSE, CAPILLARY
GLUCOSE-CAPILLARY: 103 mg/dL — AB (ref 65–99)
GLUCOSE-CAPILLARY: 106 mg/dL — AB (ref 65–99)
Glucose-Capillary: 68 mg/dL (ref 65–99)
Glucose-Capillary: 91 mg/dL (ref 65–99)
Glucose-Capillary: 124 mg/dL — ABNORMAL HIGH (ref 65–99)

## 2016-06-09 LAB — CBC
HCT: 29.6 % — ABNORMAL LOW (ref 36.0–46.0)
Hemoglobin: 9.7 g/dL — ABNORMAL LOW (ref 12.0–15.0)
MCH: 30.6 pg (ref 26.0–34.0)
MCHC: 32.8 g/dL (ref 30.0–36.0)
MCV: 93.4 fL (ref 78.0–100.0)
PLATELETS: 243 10*3/uL (ref 150–400)
RBC: 3.17 MIL/uL — AB (ref 3.87–5.11)
RDW: 17.7 % — ABNORMAL HIGH (ref 11.5–15.5)
WBC: 6.2 10*3/uL (ref 4.0–10.5)

## 2016-06-09 LAB — CULTURE, BLOOD (ROUTINE X 2)

## 2016-06-09 LAB — RENAL FUNCTION PANEL
Albumin: 2.7 g/dL — ABNORMAL LOW (ref 3.5–5.0)
Anion gap: 8 (ref 5–15)
BUN: 30 mg/dL — ABNORMAL HIGH (ref 6–20)
CALCIUM: 8.4 mg/dL — AB (ref 8.9–10.3)
CO2: 29 mmol/L (ref 22–32)
CREATININE: 4.98 mg/dL — AB (ref 0.44–1.00)
Chloride: 98 mmol/L — ABNORMAL LOW (ref 101–111)
GFR calc non Af Amer: 9 mL/min — ABNORMAL LOW (ref >60)
GFR, EST AFRICAN AMERICAN: 11 mL/min — AB (ref >60)
GLUCOSE: 84 mg/dL (ref 65–99)
Phosphorus: 5.5 mg/dL — ABNORMAL HIGH (ref 2.5–4.6)
Potassium: 3.8 mmol/L (ref 3.5–5.1)
SODIUM: 135 mmol/L (ref 135–145)

## 2016-06-09 LAB — HEMOGLOBIN A1C
Hgb A1c MFr Bld: 7.3 % — ABNORMAL HIGH (ref 4.8–5.6)
Mean Plasma Glucose: 163 mg/dL

## 2016-06-09 LAB — HEPATITIS B SURFACE ANTIGEN: Hepatitis B Surface Ag: NEGATIVE

## 2016-06-09 MED ORDER — CEFAZOLIN SODIUM-DEXTROSE 2-4 GM/100ML-% IV SOLN
2.0000 g | Freq: Once | INTRAVENOUS | Status: AC
  Administered 2016-06-09: 2 g via INTRAVENOUS
  Filled 2016-06-09: qty 100

## 2016-06-09 MED ORDER — CEFAZOLIN SODIUM-DEXTROSE 2-4 GM/100ML-% IV SOLN
2.0000 g | INTRAVENOUS | Status: DC
  Administered 2016-06-10 (×2): 2 g via INTRAVENOUS
  Filled 2016-06-09 (×2): qty 100

## 2016-06-09 NOTE — Progress Notes (Signed)
Subjective: She has no fever now. She says today she doesn't feel well with generalized aching and she feels kind of like she did when she came to the hospital she is afebrile. Her blood cultures are positive for Staphylococcus but we don't have full identification yet. Gallbladder ultrasound does show that she has what may be some air in the gallbladder. She had previous episodes of problems with right upper quadrant discomfort and nausea which she's having again and at that time she was not felt to be a surgical candidate because of super morbid obesity, recent development of end-stage renal disease on dialysis. Since that time she has lost approximately 150 pounds  Objective: Vital signs in last 24 hours: Temp:  [97.1 F (36.2 C)-97.8 F (36.6 C)] 97.1 F (36.2 C) (06/21 0755) Pulse Rate:  [65-84] 74 (06/21 0800) Resp:  [14-21] 14 (06/21 0800) BP: (74-135)/(35-118) 90/65 mmHg (06/21 0800) SpO2:  [92 %-99 %] 96 % (06/21 0800) FiO2 (%):  [28 %] 28 % (06/21 0733) Weight:  [137.5 kg (303 lb 2.1 oz)-138.1 kg (304 lb 7.3 oz)] 137.5 kg (303 lb 2.1 oz) (06/21 0500) Weight change: 0 kg (0 lb) Last BM Date: 06/07/16  Intake/Output from previous day: 06/20 0701 - 06/21 0700 In: 940 [P.O.:840; IV Piggyback:100] Out: 1150 [Urine:100]  PHYSICAL EXAM General appearance: alert, cooperative, mild distress and morbidly obese Resp: clear to auscultation bilaterally Cardio: regular rate and rhythm, S1, S2 normal, no murmur, click, rub or gallop GI: She has some right upper quadrant tenderness Extremities: Significant lymphedema  Lab Results:  Results for orders placed or performed during the hospital encounter of 06/06/16 (from the past 48 hour(s))  Troponin I (q 6hr x 3)     Status: None   Collection Time: 06/07/16  9:20 AM  Result Value Ref Range   Troponin I 0.03 <0.031 ng/mL    Comment:        NO INDICATION OF MYOCARDIAL INJURY.   Glucose, capillary     Status: Abnormal   Collection  Time: 06/07/16 11:27 AM  Result Value Ref Range   Glucose-Capillary 169 (H) 65 - 99 mg/dL   Comment 1 Notify RN    Comment 2 Document in Chart   Glucose, capillary     Status: None   Collection Time: 06/07/16  4:08 PM  Result Value Ref Range   Glucose-Capillary 92 65 - 99 mg/dL   Comment 1 Notify RN    Comment 2 Document in Chart   Glucose, capillary     Status: Abnormal   Collection Time: 06/07/16  9:23 PM  Result Value Ref Range   Glucose-Capillary 126 (H) 65 - 99 mg/dL  CBC     Status: Abnormal   Collection Time: 06/08/16  5:13 AM  Result Value Ref Range   WBC 9.9 4.0 - 10.5 K/uL   RBC 3.16 (L) 3.87 - 5.11 MIL/uL   Hemoglobin 9.9 (L) 12.0 - 15.0 g/dL   HCT 29.3 (L) 36.0 - 46.0 %   MCV 92.7 78.0 - 100.0 fL   MCH 31.3 26.0 - 34.0 pg   MCHC 33.8 30.0 - 36.0 g/dL   RDW 17.8 (H) 11.5 - 15.5 %   Platelets 242 150 - 400 K/uL  Renal function panel     Status: Abnormal   Collection Time: 06/08/16  5:13 AM  Result Value Ref Range   Sodium 133 (L) 135 - 145 mmol/L   Potassium 4.3 3.5 - 5.1 mmol/L   Chloride 101 101 -  111 mmol/L   CO2 21 (L) 22 - 32 mmol/L   Glucose, Bld 92 65 - 99 mg/dL   BUN 66 (H) 6 - 20 mg/dL   Creatinine, Ser 8.04 (H) 0.44 - 1.00 mg/dL   Calcium 8.3 (L) 8.9 - 10.3 mg/dL   Phosphorus 7.8 (H) 2.5 - 4.6 mg/dL   Albumin 2.9 (L) 3.5 - 5.0 g/dL   GFR calc non Af Amer 5 (L) >60 mL/min   GFR calc Af Amer 6 (L) >60 mL/min    Comment: (NOTE) The eGFR has been calculated using the CKD EPI equation. This calculation has not been validated in all clinical situations. eGFR's persistently <60 mL/min signify possible Chronic Kidney Disease.    Anion gap 11 5 - 15  Hepatitis B surface antigen     Status: None   Collection Time: 06/08/16  5:13 AM  Result Value Ref Range   Hepatitis B Surface Ag Negative Negative    Comment: (NOTE) Performed At: [facility] [place], [place] [id number] Lindon Romp MD [id number]   Glucose, capillary      Status: None   Collection Time: 06/08/16  7:35 AM  Result Value Ref Range   Glucose-Capillary 93 65 - 99 mg/dL   Comment 1 Notify RN    Comment 2 Document in Chart   Glucose, capillary     Status: None   Collection Time: 06/08/16 11:30 AM  Result Value Ref Range   Glucose-Capillary 75 65 - 99 mg/dL   Comment 1 Notify RN    Comment 2 Document in Chart   Glucose, capillary     Status: Abnormal   Collection Time: 06/08/16  4:16 PM  Result Value Ref Range   Glucose-Capillary 60 (L) 65 - 99 mg/dL   Comment 1 Notify RN    Comment 2 Document in Chart   Glucose, capillary     Status: None   Collection Time: 06/08/16  9:15 PM  Result Value Ref Range   Glucose-Capillary 87 65 - 99 mg/dL  CBC     Status: Abnormal   Collection Time: 06/09/16  4:45 AM  Result Value Ref Range   WBC 6.2 4.0 - 10.5 K/uL   RBC 3.17 (L) 3.87 - 5.11 MIL/uL   Hemoglobin 9.7 (L) 12.0 - 15.0 g/dL   HCT 29.6 (L) 36.0 - 46.0 %   MCV 93.4 78.0 - 100.0 fL   MCH 30.6 26.0 - 34.0 pg   MCHC 32.8 30.0 - 36.0 g/dL   RDW 17.7 (H) 11.5 - 15.5 %   Platelets 243 150 - 400 K/uL  Renal function panel     Status: Abnormal   Collection Time: 06/09/16  4:45 AM  Result Value Ref Range   Sodium 135 135 - 145 mmol/L   Potassium 3.8 3.5 - 5.1 mmol/L   Chloride 98 (L) 101 - 111 mmol/L   CO2 29 22 - 32 mmol/L   Glucose, Bld 84 65 - 99 mg/dL   BUN 30 (H) 6 - 20 mg/dL   Creatinine, Ser 4.98 (H) 0.44 - 1.00 mg/dL    Comment: DELTA CHECK NOTED   Calcium 8.4 (L) 8.9 - 10.3 mg/dL   Phosphorus 5.5 (H) 2.5 - 4.6 mg/dL   Albumin 2.7 (L) 3.5 - 5.0 g/dL   GFR calc non Af Amer 9 (L) >60 mL/min   GFR calc Af Amer 11 (L) >60 mL/min    Comment: (NOTE) The eGFR has been calculated using the CKD EPI  equation. This calculation has not been validated in all clinical situations. eGFR's persistently <60 mL/min signify possible Chronic Kidney Disease.    Anion gap 8 5 - 15  Glucose, capillary     Status: None   Collection Time: 06/09/16   7:20 AM  Result Value Ref Range   Glucose-Capillary 68 65 - 99 mg/dL   Comment 1 Notify RN    Comment 2 Document in Chart     ABGS No results for input(s): PHART, PO2ART, TCO2, HCO3 in the last 72 hours.  Invalid input(s): PCO2 CULTURES Recent Results (from the past 240 hour(s))  Culture, blood (routine x 2)     Status: Abnormal (Preliminary result)   Collection Time: 06/06/16  5:16 PM  Result Value Ref Range Status   Specimen Description BLOOD RIGHT HAND  Final   Special Requests BOTTLES DRAWN AEROBIC AND ANAEROBIC 6CC EACH  Final   Culture  Setup Time   Final    GRAM POSITIVE COCCI IN CLUSTERS RECOVERED FROM BOTH BOTTLES Gram Stain Report Called to,Read Back By and Verified With: SPANGLER,E. AT 1310 ON 06/07/2016 BY BAUGHAM,M. Performed at [place] ID to follow Performed at [place] (A)  Final   Report Status PENDING  Incomplete  Blood Culture ID Panel (Reflexed)     Status: Abnormal   Collection Time: 06/06/16  5:16 PM  Result Value Ref Range Status   Enterococcus species NOT DETECTED NOT DETECTED Final   Vancomycin resistance NOT DETECTED NOT DETECTED Final   Listeria monocytogenes NOT DETECTED NOT DETECTED Final   Staphylococcus species DETECTED (A) NOT DETECTED Final    Comment: CRITICAL RESULT CALLED TO, READ BACK BY AND VERIFIED WITH: F.SMITH 06/07/16 _0  BY V.WILKINS    Staphylococcus aureus NOT DETECTED NOT DETECTED Final   Methicillin resistance NOT DETECTED NOT DETECTED Final   Streptococcus species NOT DETECTED NOT DETECTED Final   Streptococcus agalactiae NOT DETECTED NOT DETECTED Final   Streptococcus pneumoniae NOT DETECTED NOT DETECTED Final   Streptococcus pyogenes NOT DETECTED NOT DETECTED Final   Acinetobacter baumannii NOT DETECTED NOT DETECTED Final   Enterobacteriaceae species NOT DETECTED NOT DETECTED Final   Enterobacter cloacae complex NOT DETECTED NOT DETECTED Final    Escherichia coli NOT DETECTED NOT DETECTED Final   Klebsiella oxytoca NOT DETECTED NOT DETECTED Final   Klebsiella pneumoniae NOT DETECTED NOT DETECTED Final   Proteus species NOT DETECTED NOT DETECTED Final   Serratia marcescens NOT DETECTED NOT DETECTED Final   Carbapenem resistance NOT DETECTED NOT DETECTED Final   Haemophilus influenzae NOT DETECTED NOT DETECTED Final   Neisseria meningitidis NOT DETECTED NOT DETECTED Final   Pseudomonas aeruginosa NOT DETECTED NOT DETECTED Final   Candida albicans NOT DETECTED NOT DETECTED Final   Candida glabrata NOT DETECTED NOT DETECTED Final   Candida krusei NOT DETECTED NOT DETECTED Final   Candida parapsilosis NOT DETECTED NOT DETECTED Final   Candida tropicalis NOT DETECTED NOT DETECTED Final    Comment: Performed at [facility]  Culture, blood (routine x 2)     Status: None (Preliminary result)   Collection Time: 06/06/16  5:45 PM  Result Value Ref Range Status   Specimen Description BLOOD PORTA CATH DRAWN BY RN  Final   Special Requests BOTTLES DRAWN AEROBIC AND ANAEROBIC [facility] EACH  Final   Culture  Setup Time   Final    GRAM POSITIVE COCCI IN CLUSTERS RECOVERED FROM BOTH BOTTLES Gram Stain Report Called  to,Read Back By and Verified With: [place]. AT 1310 ON 06/07/2016 BY BAUGHAM,M. Performed at [place] AND SUSCEPTIBILITIES TO FOLLOW Performed at [facility]    Report Status PENDING  Incomplete  MRSA PCR Screening     Status: None   Collection Time: 06/07/16  2:00 AM  Result Value Ref Range Status   MRSA by PCR NEGATIVE NEGATIVE Final    Comment:        The GeneXpert MRSA Assay (FDA approved for NASAL specimens only), is one component of a comprehensive MRSA colonization surveillance program. It is not intended to diagnose MRSA infection nor to guide or monitor treatment for MRSA infections.    Studies/Results: US Abdomen Limited  Ruq  06/07/2016  CLINICAL DATA:  Postpartum fever of unknown etiology EXAM: US ABDOMEN LIMITED - RIGHT UPPER QUADRANT COMPARISON:  Abdominal CT scan dated June 06, 2016 and December 08, 2015. FINDINGS: Gallbladder: The gallbladder is contracted. There is dirty shadowing which may reflect air previously demonstrated in the biliary tree and gallbladder. No discrete stones are observed. Common bile duct: Diameter: 4.5 mm Liver: The hepatic echotexture is mildly heterogeneous. No definite intra hepatic ductal air collections are observed. IMPRESSION: Limited study revealing a contracted gallbladder. This may contain air and reflect emphysematous cholecystitis. No definite stones are observed on this study. On the CT scan of December 08, 2015 tiny calcifications were noted in the gallbladder likely reflecting stones. No intra or extrahepatic bile duct gas collections are observed. These results will be called to the ordering clinician or representative by the Radiologist Assistant, and communication documented in the PACS or zVision Dashboard. Electronically Signed   By: David  [place] M.D.   On: 06/07/2016 11:00    Medications:  Prior to Admission:  Prescriptions prior to admission  Medication Sig Dispense Refill Last Dose  . acetaminophen (TYLENOL) 325 MG tablet Take 650 mg by mouth every 6 (six) hours as needed for moderate pain or fever.    Past Week at Unknown time  . albuterol (PROVENTIL) (2.5 MG/3ML) 0.083% nebulizer solution Take 2.5 mg by nebulization every 6 (six) hours as needed for wheezing or shortness of breath.   Past Week at Unknown time  . ALPRAZolam (XANAX) 0.5 MG tablet Take 0.5 mg by mouth 3 (three) times daily as needed for anxiety or sleep.    Past Week at Unknown time  . amLODipine (NORVASC) 10 MG tablet Take 10 mg by mouth daily.   06/08/2016 at Unknown time  . b complex-vitamin c-folic acid (NEPHRO-VITE) 0.8 MG TABS tablet Take 1 tablet by mouth 3 (three) times daily.    Past Week at  Unknown time  . diphenhydrAMINE (BENADRYL) 25 mg capsule Take 25 mg by mouth every 6 (six) hours as needed for allergies.   unknown  . insulin NPH-insulin regular (NOVOLIN 70/30) (70-30) 100 UNIT/ML injection Inject into the skin 2 (two) times daily. Per sliding scale   06/07/2016 at Unknown time  . metoprolol (LOPRESSOR) 50 MG tablet Take 0.5 tablets (25 mg total) by mouth 2 (two) times daily. 30 tablet 12 Past Week at Unknown time  . ondansetron (ZOFRAN-ODT) 4 MG disintegrating tablet Take 4 mg by mouth every 8 (eight) hours as needed for nausea or vomiting.   Past Week at Unknown time  . Oxycodone HCl 10 MG TABS Take 1 tablet (10 mg total) by mouth 3 (three) times daily as needed (pain).  15 tablet 0 06/07/2016 at Unknown time  . promethazine (PHENERGAN) 12.5 MG tablet Take 12.5 mg by mouth every 6 (six) hours as needed for nausea or vomiting (alternates with Zofran).   Past Month at Unknown time  . sevelamer carbonate (RENVELA) 800 MG tablet Take 1,600 mg by mouth 3 (three) times daily with meals. Takes 2 tablets with meals and 1 with snacks   06/08/2016 at Unknown time  . Sucroferric Oxyhydroxide (VELPHORO PO) Take 500 mg by mouth daily with breakfast.    Past Week at Unknown time   Scheduled: . DAPTOmycin (CUBICIN)  IV  500 mg Intravenous Q48H  . heparin  5,000 Units Subcutaneous Q8H  . insulin aspart  0-5 Units Subcutaneous QHS  . insulin aspart  0-9 Units Subcutaneous TID WC  . insulin aspart protamine- aspart  60 Units Subcutaneous BID WC  . metoprolol  25 mg Oral BID  . multivitamin  1 tablet Oral QHS  . piperacillin-tazobactam (ZOSYN)  IV  2.25 g Intravenous Q8H  . sevelamer carbonate  2,400 mg Oral TID WC  . sodium chloride flush  3 mL Intravenous Q12H  . sodium chloride flush  3 mL Intravenous Q12H   Continuous: . norepinephrine (LEVOPHED) Adult infusion Stopped (06/08/16 1300)   XHF:SFSELT chloride, sodium chloride, sodium chloride, sodium chloride, sodium chloride, acetaminophen  **OR** acetaminophen, acetaminophen, albuterol, ALPRAZolam, alteplase, diphenhydrAMINE, heparin, lidocaine (PF), lidocaine-prilocaine, ondansetron, oxyCODONE, pentafluoroprop-tetrafluoroeth, promethazine, sevelamer carbonate, sodium chloride flush  Assesment: She was admitted with fever and has bacteremia. She is improving. She has end-stage renal disease on dialysis and that seems to be doing okay. She has right upper quadrant abdominal discomfort and had what may be some air in the area of the gallbladder. I'm going to ask for surgical assessment as to whether she is a candidate for surgery now that she has lost so much weight. I don't think she necessarily needs surgery now with her bacteremia. Principal Problem:   Fever Active Problems:   Diabetes mellitus (HCC)   HTN (hypertension)   UTI (lower urinary tract infection)   ESRD (end stage renal disease) ([place])    Plan: Continue IV antibiotics. No change in treatments.    LOS: 3 days   Turrell Severt L 06/09/2016, 8:22 AM

## 2016-06-09 NOTE — Care Management Important Message (Signed)
Important Message  Patient Details  Name: [name] MRN: [id number] Date of Birth: 12/24/1965   Medicare Important Message Given:  Yes    Sherald Barge, RN 06/09/2016, 11:24 AM

## 2016-06-09 NOTE — Progress Notes (Signed)
Inpatient Diabetes Program Recommendations  AACE/ADA: New Consensus Statement on Inpatient Glycemic Control (2015)  Target Ranges:  Prepandial:   less than 140 mg/dL      Peak postprandial:   less than 180 mg/dL (1-2 hours)      Critically ill patients:  140 - 180 mg/dL   Results for [name], [name] (MRN [id number]) as of 06/09/2016 12:28  Ref. Range 06/08/2016 07:35 06/08/2016 11:30 06/08/2016 16:16 06/08/2016 21:15 06/09/2016 07:20 06/09/2016 09:01 06/09/2016 11:10  Glucose-Capillary Latest Ref Range: 65-99 mg/dL 93 75 60 (L) 87 68 91 103 (H)   Review of Glycemic Control  Diabetes history: DM2 Outpatient Diabetes medications: 70/30 60 units BID with meals (if needed; only takes if CBG greater than 160 mg/dl) Current orders for Inpatient glycemic control: 70/30 60 units BID, Novolog 0-9 units TID with meals, Novolog 0-5 units QHS  Inpatient Diabetes Program Recommendations: Insulin - Basal: While inpatient, please consider discontinuing 70/30 at this time and just use Novolog correction insulin as ordered for glycemic control.   Note: Spoke with patient over the phone about diabetes and home regimen for diabetes control. Patient reports that she is followed by PCP for diabetes management and currently she takes 70/30 60 units BID with meals if needed (if glucose is over 160 mg/dl) as an outpatient for diabetes control. Patient reports that she is not using 70/30 very often at home because her glucose has been running so well. Inquired about 70/30 use over the past 1 week and patient reports that she only took it one time over the course of the past week. Patient states that she has lost 150 lbs and her glucose has been much better since losing the weight and she rarely has to take the 70/30 insulin.  Glucose has ranged from 60-103 mg/dl over the past 30 hours.   Thanks, Barnie Alderman, RN, MSN, CDE Diabetes Coordinator Inpatient Diabetes Program [phone] (Team Pager) [phone] ([facility]  office) [phone] [facility] office) [phone] [facility] office)

## 2016-06-09 NOTE — Progress Notes (Signed)
Blood pressure at time of bed transfer blood pressure was 90 systolic. Talked to [facility] and decided to keep here on floor for now. BP has been variable from 156 to mid 85 systolic but average above 95 during day.

## 2016-06-09 NOTE — Progress Notes (Signed)
Pharmacy Antibiotic Note follow up  [name] is a 50 y.o. female admitted on 06/06/2016 with Fever, suspected bacteremia.  Pharmacy has been consulted for [place] dosing.  Dr Regenia Skeeter (EDP) D/W Dr Baxter Flattery (ID).  Pt with ESRD requiring dialysis.  Blood cultures positive for Coag neg staph(CNS), methacillin sensitive. Discussed with Dr. Megan Salon from ID Her echocardiogram does not show vegetations but is transthoracic. She has a dialysis catheter as well as a Port-A-Cath. Discussed with ID(Dr. Megan Salon) and he recommended Cefazolin 14 days with HD after repeat blood cx negative. He did not think it was necessary to remove catheters with CNS.  Plan: Cefazolin 2gm now, and 2gm after each HD Repeat blood cx x 2  Height: _0  (165.1 cm) Weight: (!) 303 lb 2.1 oz (137.5 kg) IBW/kg (Calculated) : 57  Temp (24hrs), Avg:97.4 F (36.3 C), Min:97.1 F (36.2 C), Max:97.8 F (36.6 C)   Recent Labs Lab 06/06/16 1700 06/06/16 2024 06/07/16 0424 06/08/16 0513 06/09/16 0445  WBC 22.6*  --  25.6* 9.9 6.2  CREATININE 6.40*  --  7.46* 8.04* 4.98*  LATICACIDVEN 1.8 1.6  --   --   --     Estimated Creatinine Clearance: 19.2 mL/min (by C-G formula based on Cr of 4.98).    Allergies  Allergen Reactions  . Contrast Media [Iodinated Diagnostic Agents] Anaphylaxis, Hives and Swelling    Dye for cardiac cath. Tongue swells  . Pineapple Anaphylaxis, Itching and Swelling  . Pneumococcal Vaccines Swelling    Turns skin black, and bodily swelling  . Shellfish Allergy Itching and Swelling    Facial swelling   . Vancomycin Nausea And Vomiting    Infusion "made me feel like I was dying" had to be readmitted to hospital   Anti-infectives    Start     Dose/Rate Route Frequency Ordered Stop   06/10/16 1200  ceFAZolin (ANCEF) IVPB 2g/100 mL premix     2 g 200 mL/hr over 30 Minutes Intravenous Every T-Th-Sa (Hemodialysis) 06/09/16 1451     06/09/16 1600  ceFAZolin (ANCEF) IVPB 2g/100 mL  premix     2 g 200 mL/hr over 30 Minutes Intravenous  Once 06/09/16 1451     06/08/16 2000  DAPTOmycin (CUBICIN) 500 mg in sodium chloride 0.9 % IVPB  Status:  Discontinued     500 mg 220 mL/hr over 30 Minutes Intravenous Every 48 hours 06/07/16 1127 06/09/16 1451   06/07/16 1800  piperacillin-tazobactam (ZOSYN) 2.25 g in dextrose 5 % 50 mL IVPB  Status:  Discontinued     2.25 g 100 mL/hr over 30 Minutes Intravenous Every 8 hours 06/07/16 1149 06/09/16 1451   06/07/16 0200  piperacillin-tazobactam (ZOSYN) IVPB 2.25 g  Status:  Discontinued     2.25 g 100 mL/hr over 30 Minutes Intravenous Every 8 hours 06/06/16 2031 06/07/16 1149   06/06/16 2100  DAPTOmycin (CUBICIN) 500 mg in sodium chloride 0.9 % IVPB     500 mg 220 mL/hr over 30 Minutes Intravenous  Once 06/06/16 1849 06/06/16 2112   06/06/16 1830  piperacillin-tazobactam (ZOSYN) IVPB 3.375 g     3.375 g 100 mL/hr over 30 Minutes Intravenous  Once 06/06/16 1826 06/06/16 1900     Recent Results (from the past 240 hour(s))  Culture, blood (routine x 2)     Status: Abnormal   Collection Time: 06/06/16  5:16 PM  Result Value Ref Range Status   Specimen Description BLOOD RIGHT HAND  Final   Special Requests  BOTTLES DRAWN AEROBIC AND ANAEROBIC 6CC EACH  Final   Culture  Setup Time   Final    GRAM POSITIVE COCCI IN CLUSTERS RECOVERED FROM BOTH BOTTLES Gram Stain Report Called to,Read Back By and Verified With: SPANGLER,E. AT 1310 ON 06/07/2016 BY BAUGHAM,M. Performed at [facility] ID to follow Performed at [facility] (COAGULASE NEGATIVE) (A)  Final   Report Status 06/09/2016 FINAL  Final   Organism ID, Bacteria STAPHYLOCOCCUS SPECIES (COAGULASE NEGATIVE)  Final      Susceptibility   Staphylococcus species (coagulase negative) - MIC*    CIPROFLOXACIN <=0.5 SENSITIVE Sensitive     ERYTHROMYCIN >=8 RESISTANT Resistant     GENTAMICIN <=0.5 SENSITIVE Sensitive     OXACILLIN  <=0.25 SENSITIVE Sensitive     TETRACYCLINE <=1 SENSITIVE Sensitive     VANCOMYCIN <=0.5 SENSITIVE Sensitive     TRIMETH/SULFA <=10 SENSITIVE Sensitive     CLINDAMYCIN >=8 RESISTANT Resistant     RIFAMPIN <=0.5 SENSITIVE Sensitive     Inducible Clindamycin NEGATIVE Sensitive     * STAPHYLOCOCCUS SPECIES (COAGULASE NEGATIVE)  Blood Culture ID Panel (Reflexed)     Status: Abnormal   Collection Time: 06/06/16  5:16 PM  Result Value Ref Range Status   Enterococcus species NOT DETECTED NOT DETECTED Final   Vancomycin resistance NOT DETECTED NOT DETECTED Final   Listeria monocytogenes NOT DETECTED NOT DETECTED Final   Staphylococcus species DETECTED (A) NOT DETECTED Final    Comment: CRITICAL RESULT CALLED TO, READ BACK BY AND VERIFIED WITH: F.SMITH 06/07/16 _0  BY V.WILKINS    Staphylococcus aureus NOT DETECTED NOT DETECTED Final   Methicillin resistance NOT DETECTED NOT DETECTED Final   Streptococcus species NOT DETECTED NOT DETECTED Final   Streptococcus agalactiae NOT DETECTED NOT DETECTED Final   Streptococcus pneumoniae NOT DETECTED NOT DETECTED Final   Streptococcus pyogenes NOT DETECTED NOT DETECTED Final   Acinetobacter baumannii NOT DETECTED NOT DETECTED Final   Enterobacteriaceae species NOT DETECTED NOT DETECTED Final   Enterobacter cloacae complex NOT DETECTED NOT DETECTED Final   Escherichia coli NOT DETECTED NOT DETECTED Final   Klebsiella oxytoca NOT DETECTED NOT DETECTED Final   Klebsiella pneumoniae NOT DETECTED NOT DETECTED Final   Proteus species NOT DETECTED NOT DETECTED Final   Serratia marcescens NOT DETECTED NOT DETECTED Final   Carbapenem resistance NOT DETECTED NOT DETECTED Final   Haemophilus influenzae NOT DETECTED NOT DETECTED Final   Neisseria meningitidis NOT DETECTED NOT DETECTED Final   Pseudomonas aeruginosa NOT DETECTED NOT DETECTED Final   Candida albicans NOT DETECTED NOT DETECTED Final   Candida glabrata NOT DETECTED NOT DETECTED Final   Candida  krusei NOT DETECTED NOT DETECTED Final   Candida parapsilosis NOT DETECTED NOT DETECTED Final   Candida tropicalis NOT DETECTED NOT DETECTED Final    Comment: Performed at [facility]  Culture, blood (routine x 2)     Status: Abnormal   Collection Time: 06/06/16  5:45 PM  Result Value Ref Range Status   Specimen Description BLOOD PORTA CATH DRAWN BY RN  Final   Special Requests BOTTLES DRAWN AEROBIC AND ANAEROBIC 6CC EACH  Final   Culture  Setup Time   Final    GRAM POSITIVE COCCI IN CLUSTERS RECOVERED FROM BOTH BOTTLES Gram Stain Report Called to,Read Back By and Verified With: SPANGLER,E. AT 1310 ON 06/07/2016 BY BAUGHAM,M. Performed at [facility]    Culture (A)  Final    STAPHYLOCOCCUS SPECIES (  COAGULASE NEGATIVE) SUSCEPTIBILITIES PERFORMED ON PREVIOUS CULTURE WITHIN THE LAST 5 DAYS. Performed at [facility]    Report Status 06/09/2016 FINAL  Final  MRSA PCR Screening     Status: None   Collection Time: 06/07/16  2:00 AM  Result Value Ref Range Status   MRSA by PCR NEGATIVE NEGATIVE Final    Comment:        The GeneXpert MRSA Assay (FDA approved for NASAL specimens only), is one component of a comprehensive MRSA colonization surveillance program. It is not intended to diagnose MRSA infection nor to guide or monitor treatment for MRSA infections.    Thank you for allowing pharmacy to be a part of this patient's care. Isac Sarna, BS Pharm D, [place] Clinical Pharmacist Pager [phone]  06/09/2016 2:51 PM

## 2016-06-10 LAB — GLUCOSE, CAPILLARY
GLUCOSE-CAPILLARY: 102 mg/dL — AB (ref 65–99)
GLUCOSE-CAPILLARY: 145 mg/dL — AB (ref 65–99)
Glucose-Capillary: 120 mg/dL — ABNORMAL HIGH (ref 65–99)

## 2016-06-10 LAB — RENAL FUNCTION PANEL
ALBUMIN: 3 g/dL — AB (ref 3.5–5.0)
ANION GAP: 10 (ref 5–15)
BUN: 40 mg/dL — ABNORMAL HIGH (ref 6–20)
CALCIUM: 8.7 mg/dL — AB (ref 8.9–10.3)
CO2: 26 mmol/L (ref 22–32)
Chloride: 98 mmol/L — ABNORMAL LOW (ref 101–111)
Creatinine, Ser: 7.02 mg/dL — ABNORMAL HIGH (ref 0.44–1.00)
GFR calc non Af Amer: 6 mL/min — ABNORMAL LOW (ref >60)
GFR, EST AFRICAN AMERICAN: 7 mL/min — AB (ref >60)
Glucose, Bld: 177 mg/dL — ABNORMAL HIGH (ref 65–99)
PHOSPHORUS: 5.7 mg/dL — AB (ref 2.5–4.6)
Potassium: 4.3 mmol/L (ref 3.5–5.1)
SODIUM: 134 mmol/L — AB (ref 135–145)

## 2016-06-10 LAB — CBC
HCT: 32 % — ABNORMAL LOW (ref 36.0–46.0)
HEMOGLOBIN: 10.4 g/dL — AB (ref 12.0–15.0)
MCH: 30.7 pg (ref 26.0–34.0)
MCHC: 32.5 g/dL (ref 30.0–36.0)
MCV: 94.4 fL (ref 78.0–100.0)
Platelets: 245 10*3/uL (ref 150–400)
RBC: 3.39 MIL/uL — AB (ref 3.87–5.11)
RDW: 17.6 % — ABNORMAL HIGH (ref 11.5–15.5)
WBC: 6.3 10*3/uL (ref 4.0–10.5)

## 2016-06-10 MED ORDER — SODIUM CHLORIDE 0.9 % IV SOLN: 100.0000 mL | INTRAVENOUS | Status: DC | PRN

## 2016-06-10 MED ORDER — SEVELAMER CARBONATE 800 MG PO TABS
ORAL_TABLET | ORAL | Status: AC
  Administered 2016-06-10: 2400 mg via ORAL
  Filled 2016-06-10: qty 3

## 2016-06-10 NOTE — Care Management Note (Signed)
Case Management Note  Patient Details  Name: [name] MRN: [id number] Date of Birth: [birth year]   Expected Discharge Date:    06/11/2016              Expected Discharge Plan:  [place]  In-House Referral:  NA  Discharge planning Services  CM Consult  Post Acute Care Choice:  Home Health Choice offered to:  Patient  DME Arranged:    DME Agency:     HH Arranged:  RN Webb Agency:  [place]  Status of Service:  In process, will continue to follow  If discussed at Long Length of Stay Meetings, dates discussed:    Additional Comments: Anticipate DC home tomorrow, pt will need IV abx with HD for the next 6 weeks. RN at [facility] in [place] has been notified and will be faxed DC summary when available. Pt will need order to resume [facility] RN/PT services at DC. Santiago Glad of [facility] is aware of potential DC and will be updated.   Sherald Barge, RN 06/10/2016, 11:03 AM

## 2016-06-10 NOTE — Progress Notes (Signed)
Subjective: Interval History: Patient feels much better. Presently she doesn't offer any complaints. Appetite is good no difficulty breathing.  Objective: Vital signs in last 24 hours: Temp:  [97.8 F (36.6 C)-98.5 F (36.9 C)] 98.1 F (36.7 C) (06/22 0748) Pulse Rate:  [59-70] 64 (06/22 0700) Resp:  [14-25] 21 (06/22 0700) BP: (83-128)/(48-70) 119/70 mmHg (06/22 0700) SpO2:  [93 %-100 %] 97 % (06/22 0700) FiO2 (%):  [28 %] 28 % (06/21 1800) Weight change:   Intake/Output from previous day: 06/21 0701 - 06/22 0700 In: 1230 [P.O.:960; I.V.:120; IV Piggyback:150] Out: -  Intake/Output this shift:   Generally patient is alert and in no apparent distress Chest: Decreased breath sound bilaterally Heart exam regular rate and rhythm no   extremities she has trace edema/venous stasis.   Lab Results:  Recent Labs  06/08/16 0513 06/09/16 0445  WBC 9.9 6.2  HGB 9.9* 9.7*  HCT 29.3* 29.6*  PLT 242 243   BMET:   Recent Labs  06/08/16 0513 06/09/16 0445  NA 133* 135  K 4.3 3.8  CL 101 98*  CO2 21* 29  GLUCOSE 92 84  BUN 66* 30*  CREATININE 8.04* 4.98*  CALCIUM 8.3* 8.4*   No results for input(s): PTH in the last 72 hours. Iron Studies: No results for input(s): IRON, TIBC, TRANSFERRIN, FERRITIN in the last 72 hours.  Studies/Results: No results found.  I have reviewed the patient's current medications.  Assessment/Plan: Problem #1 end-stage renal disease: She is status post hemodialysis on Tuesday. Presently she doesn't have any uremic signs and symptoms. Problem #2 fever: Presently patient is afebrile and her white blood cell count is normal. Blood culture is positive for coag-negative staph sensitive to oxacillin. Presently patient is started on Ancef. The suggestion is to continue with antibiotics for 14 days. Repeat culture is pending. Problem #3 metabolic bone disease: Her calcium is in range but phosphorus is high. But her phosphorus has come down to 5.5.  Presently she is on Renvela. Problem #4 anemia: Her hemoglobin is slightly below our target goal. Patient is on Epogen Problem #5 hypotension: Possibly a combination of sepsis and antihypertensive medications. Her blood pressure slightly low but seems to be better. Problem #6 fluid management: Patient presents today doesn't have significant signs of fluid overload Problem #7 nutrition Plan: 1] We'll dialyze patient today for 4 hours and have. 2] we will remove 3 L if her blood pressure tolerates. 3] we'll use low temperature dialysate temperature.   LOS: 4 days   Akaylah Lalley S 06/10/2016,9:01 AM   [email or web address]

## 2016-06-10 NOTE — Progress Notes (Signed)
Inpatient Diabetes Program Recommendations  AACE/ADA: New Consensus Statement on Inpatient Glycemic Control (2015)  Target Ranges:  Prepandial:   less than 140 mg/dL      Peak postprandial:   less than 180 mg/dL (1-2 hours)      Critically ill patients:  140 - 180 mg/dL  Results for ZANYLA, KLEBBA (MRN [id number]) as of 06/10/2016 09:22  Ref. Range 06/09/2016 07:20 06/09/2016 09:01 06/09/2016 11:10 06/09/2016 16:27 06/09/2016 21:25 06/10/2016 07:24  Glucose-Capillary Latest Ref Range: 65-99 mg/dL 68 91 103 (H) 106 (H) 124 (H) 120 (H)   Review of Glycemic Control  Diabetes history: DM2 Outpatient Diabetes medications: 70/30 60 units BID with meals (if needed; only takes if CBG greater than 160 mg/dl) Current orders for Inpatient glycemic control: 70/30 60 units BID, Novolog 0-9 units TID with meals, Novolog 0-5 units QHS  Inpatient Diabetes Program Recommendations: Insulin - Basal: NO 70/30 was given at all yesterday and per conversation with patient on 06/09/16 patient is rarely having to take 70/30 at home. While inpatient, please consider discontinuing 70/30 and just use Novolog correction insulin as ordered for glycemic control.   Thanks, Barnie Alderman, RN, MSN, CDE Diabetes Coordinator Inpatient Diabetes Program [phone] (Team Pager from [place] to [place]) [phone] ([facility] office) [phone] [facility] office) [phone] [facility] ([place]) office)

## 2016-06-10 NOTE — Procedures (Signed)
HEMODIALYSIS TREATMENT NOTE:  4.5 hour heparin-free dialysis completed via right IJ tunneled catheter. Exit site unremarkable. Goal met: 3.4 liters removed without interruption in ultrafiltration. All blood was returned.  Afebrile and hemodynamically stable throughout HD session with SBP 110-130.  Cefazolin given at end of HD.  Pt to transfer to 337.  Report called to [place], Therapist, sports.  Rockwell Alexandria, RN, CDN

## 2016-06-10 NOTE — Progress Notes (Signed)
Subjective: She says she feels better. Information from pharmacy/ID noted and appreciated. Her antibiotics have been adjusted. She's not having any right upper quadrant discomfort or nausea now. Blood cultures have been repeated and are pending  Objective: Vital signs in last 24 hours: Temp:  [97.8 F (36.6 C)-98.5 F (36.9 C)] 98.1 F (36.7 C) (06/22 0748) Pulse Rate:  [59-71] 64 (06/22 0700) Resp:  [13-25] 21 (06/22 0700) BP: (83-128)/(48-70) 119/70 mmHg (06/22 0700) SpO2:  [93 %-100 %] 97 % (06/22 0700) FiO2 (%):  [28 %] 28 % (06/21 1800) Weight change:  Last BM Date: 06/07/16  Intake/Output from previous day: 06/21 0701 - 06/22 0700 In: 71 [P.O.:960; I.V.:120; IV Piggyback:150] Out: -   PHYSICAL EXAM General appearance: alert, cooperative, no distress and moderately obese Resp: clear to auscultation bilaterally Cardio: regular rate and rhythm, S1, S2 normal, no murmur, click, rub or gallop GI: soft, non-tender; bowel sounds normal; no masses,  no organomegaly Extremities: Chronic lymphedema  Lab Results:  Results for orders placed or performed during the hospital encounter of 06/06/16 (from the past 48 hour(s))  Glucose, capillary     Status: None   Collection Time: 06/08/16 11:30 AM  Result Value Ref Range   Glucose-Capillary 75 65 - 99 mg/dL   Comment 1 Notify RN    Comment 2 Document in Chart   Glucose, capillary     Status: Abnormal   Collection Time: 06/08/16  4:16 PM  Result Value Ref Range   Glucose-Capillary 60 (L) 65 - 99 mg/dL   Comment 1 Notify RN    Comment 2 Document in Chart   Glucose, capillary     Status: None   Collection Time: 06/08/16  9:15 PM  Result Value Ref Range   Glucose-Capillary 87 65 - 99 mg/dL  CBC     Status: Abnormal   Collection Time: 06/09/16  4:45 AM  Result Value Ref Range   WBC 6.2 4.0 - 10.5 K/uL   RBC 3.17 (L) 3.87 - 5.11 MIL/uL   Hemoglobin 9.7 (L) 12.0 - 15.0 g/dL   HCT 29.6 (L) 36.0 - 46.0 %   MCV 93.4 78.0 - 100.0  fL   MCH 30.6 26.0 - 34.0 pg   MCHC 32.8 30.0 - 36.0 g/dL   RDW 17.7 (H) 11.5 - 15.5 %   Platelets 243 150 - 400 K/uL  Renal function panel     Status: Abnormal   Collection Time: 06/09/16  4:45 AM  Result Value Ref Range   Sodium 135 135 - 145 mmol/L   Potassium 3.8 3.5 - 5.1 mmol/L   Chloride 98 (L) 101 - 111 mmol/L   CO2 29 22 - 32 mmol/L   Glucose, Bld 84 65 - 99 mg/dL   BUN 30 (H) 6 - 20 mg/dL   Creatinine, Ser 4.98 (H) 0.44 - 1.00 mg/dL    Comment: DELTA CHECK NOTED   Calcium 8.4 (L) 8.9 - 10.3 mg/dL   Phosphorus 5.5 (H) 2.5 - 4.6 mg/dL   Albumin 2.7 (L) 3.5 - 5.0 g/dL   GFR calc non Af Amer 9 (L) >60 mL/min   GFR calc Af Amer 11 (L) >60 mL/min    Comment: (NOTE) The eGFR has been calculated using the CKD EPI equation. This calculation has not been validated in all clinical situations. eGFR's persistently <60 mL/min signify possible Chronic Kidney Disease.    Anion gap 8 5 - 15  Glucose, capillary     Status: None   Collection  Time: 06/09/16  7:20 AM  Result Value Ref Range   Glucose-Capillary 68 65 - 99 mg/dL   Comment 1 Notify RN    Comment 2 Document in Chart   Glucose, capillary     Status: None   Collection Time: 06/09/16  9:01 AM  Result Value Ref Range   Glucose-Capillary 91 65 - 99 mg/dL   Comment 1 Notify RN    Comment 2 Document in Chart   Glucose, capillary     Status: Abnormal   Collection Time: 06/09/16 11:10 AM  Result Value Ref Range   Glucose-Capillary 103 (H) 65 - 99 mg/dL   Comment 1 Notify RN    Comment 2 Document in Chart   Glucose, capillary     Status: Abnormal   Collection Time: 06/09/16  4:27 PM  Result Value Ref Range   Glucose-Capillary 106 (H) 65 - 99 mg/dL   Comment 1 Notify RN    Comment 2 Document in Chart   Glucose, capillary     Status: Abnormal   Collection Time: 06/09/16  9:25 PM  Result Value Ref Range   Glucose-Capillary 124 (H) 65 - 99 mg/dL  Glucose, capillary     Status: Abnormal   Collection Time: 06/10/16  7:24 AM   Result Value Ref Range   Glucose-Capillary 120 (H) 65 - 99 mg/dL   Comment 1 Notify RN    Comment 2 Document in Chart     ABGS No results for input(s): PHART, PO2ART, TCO2, HCO3 in the last 72 hours.  Invalid input(s): PCO2 CULTURES Recent Results (from the past 240 hour(s))  Culture, blood (routine x 2)     Status: Abnormal   Collection Time: 06/06/16  5:16 PM  Result Value Ref Range Status   Specimen Description BLOOD RIGHT HAND  Final   Special Requests BOTTLES DRAWN AEROBIC AND ANAEROBIC 6CC EACH  Final   Culture  Setup Time   Final    GRAM POSITIVE COCCI IN CLUSTERS RECOVERED FROM BOTH BOTTLES Gram Stain Report Called to,Read Back By and Verified With: SPANGLER,E. AT 1310 ON 06/07/2016 BY BAUGHAM,M. Performed at [facility] ID to follow Performed at [facility] (COAGULASE NEGATIVE) (A)  Final   Report Status 06/09/2016 FINAL  Final   Organism ID, Bacteria STAPHYLOCOCCUS SPECIES (COAGULASE NEGATIVE)  Final      Susceptibility   Staphylococcus species (coagulase negative) - MIC*    CIPROFLOXACIN <=0.5 SENSITIVE Sensitive     ERYTHROMYCIN >=8 RESISTANT Resistant     GENTAMICIN <=0.5 SENSITIVE Sensitive     OXACILLIN <=0.25 SENSITIVE Sensitive     TETRACYCLINE <=1 SENSITIVE Sensitive     VANCOMYCIN <=0.5 SENSITIVE Sensitive     TRIMETH/SULFA <=10 SENSITIVE Sensitive     CLINDAMYCIN >=8 RESISTANT Resistant     RIFAMPIN <=0.5 SENSITIVE Sensitive     Inducible Clindamycin NEGATIVE Sensitive     * STAPHYLOCOCCUS SPECIES (COAGULASE NEGATIVE)  Blood Culture ID Panel (Reflexed)     Status: Abnormal   Collection Time: 06/06/16  5:16 PM  Result Value Ref Range Status   Enterococcus species NOT DETECTED NOT DETECTED Final   Vancomycin resistance NOT DETECTED NOT DETECTED Final   Listeria monocytogenes NOT DETECTED NOT DETECTED Final   Staphylococcus species DETECTED (A) NOT DETECTED Final    Comment: CRITICAL RESULT  CALLED TO, READ BACK BY AND VERIFIED WITH: F.SMITH 06/07/16 _0  BY V.WILKINS    Staphylococcus aureus NOT DETECTED NOT DETECTED Final  Methicillin resistance NOT DETECTED NOT DETECTED Final   Streptococcus species NOT DETECTED NOT DETECTED Final   Streptococcus agalactiae NOT DETECTED NOT DETECTED Final   Streptococcus pneumoniae NOT DETECTED NOT DETECTED Final   Streptococcus pyogenes NOT DETECTED NOT DETECTED Final   Acinetobacter baumannii NOT DETECTED NOT DETECTED Final   Enterobacteriaceae species NOT DETECTED NOT DETECTED Final   Enterobacter cloacae complex NOT DETECTED NOT DETECTED Final   Escherichia coli NOT DETECTED NOT DETECTED Final   Klebsiella oxytoca NOT DETECTED NOT DETECTED Final   Klebsiella pneumoniae NOT DETECTED NOT DETECTED Final   Proteus species NOT DETECTED NOT DETECTED Final   Serratia marcescens NOT DETECTED NOT DETECTED Final   Carbapenem resistance NOT DETECTED NOT DETECTED Final   Haemophilus influenzae NOT DETECTED NOT DETECTED Final   Neisseria meningitidis NOT DETECTED NOT DETECTED Final   Pseudomonas aeruginosa NOT DETECTED NOT DETECTED Final   Candida albicans NOT DETECTED NOT DETECTED Final   Candida glabrata NOT DETECTED NOT DETECTED Final   Candida krusei NOT DETECTED NOT DETECTED Final   Candida parapsilosis NOT DETECTED NOT DETECTED Final   Candida tropicalis NOT DETECTED NOT DETECTED Final    Comment: Performed at [facility]  Culture, blood (routine x 2)     Status: Abnormal   Collection Time: 06/06/16  5:45 PM  Result Value Ref Range Status   Specimen Description BLOOD PORTA CATH DRAWN BY RN  Final   Special Requests BOTTLES DRAWN AEROBIC AND ANAEROBIC 6CC EACH  Final   Culture  Setup Time   Final    GRAM POSITIVE COCCI IN CLUSTERS RECOVERED FROM BOTH BOTTLES Gram Stain Report Called to,Read Back By and Verified With: SPANGLER,E. AT 64 ON 06/07/2016 BY BAUGHAM,M. Performed at [facility]    Culture (A)  Final     STAPHYLOCOCCUS SPECIES (COAGULASE NEGATIVE) SUSCEPTIBILITIES PERFORMED ON PREVIOUS CULTURE WITHIN THE LAST 5 DAYS. Performed at [facility]    Report Status 06/09/2016 FINAL  Final  MRSA PCR Screening     Status: None   Collection Time: 06/07/16  2:00 AM  Result Value Ref Range Status   MRSA by PCR NEGATIVE NEGATIVE Final    Comment:        The GeneXpert MRSA Assay (FDA approved for NASAL specimens only), is one component of a comprehensive MRSA colonization surveillance program. It is not intended to diagnose MRSA infection nor to guide or monitor treatment for MRSA infections.    Studies/Results: No results found.  Medications:  Prior to Admission:  Prescriptions prior to admission  Medication Sig Dispense Refill Last Dose  . acetaminophen (TYLENOL) 325 MG tablet Take 650 mg by mouth every 6 (six) hours as needed for moderate pain or fever.    Past Week at Unknown time  . albuterol (PROVENTIL) (2.5 MG/3ML) 0.083% nebulizer solution Take 2.5 mg by nebulization every 6 (six) hours as needed for wheezing or shortness of breath.   Past Week at Unknown time  . ALPRAZolam (XANAX) 0.5 MG tablet Take 0.5 mg by mouth 3 (three) times daily as needed for anxiety or sleep.    Past Week at Unknown time  . amLODipine (NORVASC) 10 MG tablet Take 10 mg by mouth daily.   06/08/2016 at Unknown time  . b complex-vitamin c-folic acid (NEPHRO-VITE) 0.8 MG TABS tablet Take 1 tablet by mouth 3 (three) times daily.    Past Week at Unknown time  . diphenhydrAMINE (BENADRYL) 25 mg capsule Take 25 mg by mouth every 6 (six)  hours as needed for allergies.   unknown  . insulin NPH-insulin regular (NOVOLIN 70/30) (70-30) 100 UNIT/ML injection Inject into the skin 2 (two) times daily. Per sliding scale   06/07/2016 at Unknown time  . metoprolol (LOPRESSOR) 50 MG tablet Take 0.5 tablets (25 mg total) by mouth 2 (two) times daily. 30 tablet 12 Past Week at Unknown time  . ondansetron (ZOFRAN-ODT) 4 MG  disintegrating tablet Take 4 mg by mouth every 8 (eight) hours as needed for nausea or vomiting.   Past Week at Unknown time  . Oxycodone HCl 10 MG TABS Take 1 tablet (10 mg total) by mouth 3 (three) times daily as needed (pain). 15 tablet 0 06/07/2016 at Unknown time  . promethazine (PHENERGAN) 12.5 MG tablet Take 12.5 mg by mouth every 6 (six) hours as needed for nausea or vomiting (alternates with Zofran).   Past Month at Unknown time  . sevelamer carbonate (RENVELA) 800 MG tablet Take 1,600 mg by mouth 3 (three) times daily with meals. Takes 2 tablets with meals and 1 with snacks   06/08/2016 at Unknown time  . Sucroferric Oxyhydroxide (VELPHORO PO) Take 500 mg by mouth daily with breakfast.    Past Week at Unknown time   Scheduled: .  ceFAZolin (ANCEF) IV  2 g Intravenous Q T,Th,Sa-HD  . heparin  5,000 Units Subcutaneous Q8H  . insulin aspart  0-5 Units Subcutaneous QHS  . insulin aspart  0-9 Units Subcutaneous TID WC  . insulin aspart protamine- aspart  60 Units Subcutaneous BID WC  . metoprolol  25 mg Oral BID  . multivitamin  1 tablet Oral QHS  . sevelamer carbonate  2,400 mg Oral TID WC  . sodium chloride flush  3 mL Intravenous Q12H  . sodium chloride flush  3 mL Intravenous Q12H   Continuous: . norepinephrine (LEVOPHED) Adult infusion Stopped (06/08/16 1300)   ZSW:FUXNAT chloride, sodium chloride, sodium chloride, sodium chloride, sodium chloride, acetaminophen **OR** acetaminophen, acetaminophen, albuterol, ALPRAZolam, alteplase, diphenhydrAMINE, heparin, lidocaine (PF), lidocaine-prilocaine, ondansetron, oxyCODONE, pentafluoroprop-tetrafluoroeth, promethazine, sevelamer carbonate, sodium chloride flush  Assesment: She was remitted with febrile illness and has bacteremia. She is being treated and has improved.  She has end-stage renal disease on dialysis and dialysis is ongoing  She has morbid obesity but has lost 150 pounds some of which was fluid. She is much more active  now  She has diabetes which is well controlled on current treatment  She has abnormalities in her gallbladder and I'm seeing if she would be a candidate for surgery at a later date  She has chronic lymphedema unchanged Principal Problem:   Fever Active Problems:   Diabetes mellitus ([place])   HTN (hypertension)   UTI (lower urinary tract infection)   ESRD (end stage renal disease) ([place])    Plan: Transfer to the floor. Blood cultures are pending. She may be able to be discharged tomorrow and will receive IV antibiotics after dialysis for the next 6 weeks    LOS: 4 days   Layann Bluett L 06/10/2016, 8:21 AM

## 2016-06-10 NOTE — Progress Notes (Signed)
Pt transferred to 337 from dialysis. Belongings w/ pt and family at bedside. Pt sitting up in chair.

## 2016-06-11 DIAGNOSIS — B957 Other staphylococcus as the cause of diseases classified elsewhere: Secondary | ICD-10-CM | POA: Diagnosis present

## 2016-06-11 DIAGNOSIS — R7881 Bacteremia: Secondary | ICD-10-CM | POA: Diagnosis present

## 2016-06-11 LAB — GLUCOSE, CAPILLARY
GLUCOSE-CAPILLARY: 118 mg/dL — AB (ref 65–99)
Glucose-Capillary: 133 mg/dL — ABNORMAL HIGH (ref 65–99)

## 2016-06-11 MED ORDER — AMLODIPINE BESYLATE 10 MG PO TABS: 5.0000 mg | ORAL_TABLET | Freq: Every day | ORAL | Status: DC

## 2016-06-11 NOTE — Progress Notes (Signed)
Subjective: She feels well. No complaints. Second set of blood cultures is negative now at 2 days.  Objective: Vital signs in last 24 hours: Temp:  [97.7 F (36.5 C)-98.6 F (37 C)] 98.5 F (36.9 C) (06/23 0612) Pulse Rate:  [55-74] 65 (06/23 0612) Resp:  [16-21] 20 (06/23 0612) BP: (96-139)/(50-77) 103/60 mmHg (06/23 0612) SpO2:  [92 %-100 %] 96 % (06/23 0612) Weight:  [136.5 kg (300 lb 14.9 oz)-137.2 kg (302 lb 7.5 oz)] 136.5 kg (300 lb 14.9 oz) (06/23 0612) Weight change:  Last BM Date: 06/08/16  Intake/Output from previous day: 06/22 0701 - 06/23 0700 In: 440 [P.O.:240; IV Piggyback:200] Out: 3300   PHYSICAL EXAM General appearance: alert, cooperative and no distress Resp: clear to auscultation bilaterally Cardio: regular rate and rhythm, S1, S2 normal, no murmur, click, rub or gallop GI: soft, non-tender; bowel sounds normal; no masses,  no organomegaly Extremities: Chronic lymphedema  Lab Results:  Results for orders placed or performed during the hospital encounter of 06/06/16 (from the past 48 hour(s))  Glucose, capillary     Status: None   Collection Time: 06/09/16  9:01 AM  Result Value Ref Range   Glucose-Capillary 91 65 - 99 mg/dL   Comment 1 Notify RN    Comment 2 Document in Chart   Glucose, capillary     Status: Abnormal   Collection Time: 06/09/16 11:10 AM  Result Value Ref Range   Glucose-Capillary 103 (H) 65 - 99 mg/dL   Comment 1 Notify RN    Comment 2 Document in Chart   Culture, blood (Routine X 2) w Reflex to ID Panel     Status: None (Preliminary result)   Collection Time: 06/09/16  2:34 PM  Result Value Ref Range   Specimen Description BLOOD PORTA CATH DRAWN BY RN    Special Requests      BOTTLES DRAWN AEROBIC AND ANAEROBIC AEB=8CC ANA=6CC   Culture NO GROWTH 2 DAYS    Report Status PENDING   Culture, blood (Routine X 2) w Reflex to ID Panel     Status: None (Preliminary result)   Collection Time: 06/09/16  2:45 PM  Result Value Ref Range    Specimen Description BLOOD RIGHT FOREARM    Special Requests      BOTTLES DRAWN AEROBIC AND ANAEROBIC AEB=8CC ANA=6CC   Culture NO GROWTH 2 DAYS    Report Status PENDING   Glucose, capillary     Status: Abnormal   Collection Time: 06/09/16  4:27 PM  Result Value Ref Range   Glucose-Capillary 106 (H) 65 - 99 mg/dL   Comment 1 Notify RN    Comment 2 Document in Chart   Glucose, capillary     Status: Abnormal   Collection Time: 06/09/16  9:25 PM  Result Value Ref Range   Glucose-Capillary 124 (H) 65 - 99 mg/dL  Glucose, capillary     Status: Abnormal   Collection Time: 06/10/16  7:24 AM  Result Value Ref Range   Glucose-Capillary 120 (H) 65 - 99 mg/dL   Comment 1 Notify RN    Comment 2 Document in Chart   Renal function panel     Status: Abnormal   Collection Time: 06/10/16 10:45 AM  Result Value Ref Range   Sodium 134 (L) 135 - 145 mmol/L   Potassium 4.3 3.5 - 5.1 mmol/L   Chloride 98 (L) 101 - 111 mmol/L   CO2 26 22 - 32 mmol/L   Glucose, Bld 177 (H) 65 - 99  mg/dL   BUN 40 (H) 6 - 20 mg/dL   Creatinine, Ser 7.02 (H) 0.44 - 1.00 mg/dL   Calcium 8.7 (L) 8.9 - 10.3 mg/dL   Phosphorus 5.7 (H) 2.5 - 4.6 mg/dL   Albumin 3.0 (L) 3.5 - 5.0 g/dL   GFR calc non Af Amer 6 (L) >60 mL/min   GFR calc Af Amer 7 (L) >60 mL/min    Comment: (NOTE) The eGFR has been calculated using the CKD EPI equation. This calculation has not been validated in all clinical situations. eGFR's persistently <60 mL/min signify possible Chronic Kidney Disease.    Anion gap 10 5 - 15  CBC     Status: Abnormal   Collection Time: 06/10/16 10:45 AM  Result Value Ref Range   WBC 6.3 4.0 - 10.5 K/uL   RBC 3.39 (L) 3.87 - 5.11 MIL/uL   Hemoglobin 10.4 (L) 12.0 - 15.0 g/dL   HCT 32.0 (L) 36.0 - 46.0 %   MCV 94.4 78.0 - 100.0 fL   MCH 30.7 26.0 - 34.0 pg   MCHC 32.5 30.0 - 36.0 g/dL   RDW 17.6 (H) 11.5 - 15.5 %   Platelets 245 150 - 400 K/uL  Glucose, capillary     Status: Abnormal   Collection Time:  06/10/16  4:35 PM  Result Value Ref Range   Glucose-Capillary 102 (H) 65 - 99 mg/dL   Comment 1 Notify RN    Comment 2 Document in Chart   Glucose, capillary     Status: Abnormal   Collection Time: 06/10/16 10:08 PM  Result Value Ref Range   Glucose-Capillary 145 (H) 65 - 99 mg/dL   Comment 1 Notify RN    Comment 2 Document in Chart   Glucose, capillary     Status: Abnormal   Collection Time: 06/11/16  7:29 AM  Result Value Ref Range   Glucose-Capillary 118 (H) 65 - 99 mg/dL   Comment 1 Notify RN    Comment 2 Document in Chart     ABGS No results for input(s): PHART, PO2ART, TCO2, HCO3 in the last 72 hours.  Invalid input(s): PCO2 CULTURES Recent Results (from the past 240 hour(s))  Culture, blood (routine x 2)     Status: Abnormal   Collection Time: 06/06/16  5:16 PM  Result Value Ref Range Status   Specimen Description BLOOD RIGHT HAND  Final   Special Requests BOTTLES DRAWN AEROBIC AND ANAEROBIC 6CC EACH  Final   Culture  Setup Time   Final    GRAM POSITIVE COCCI IN CLUSTERS RECOVERED FROM BOTH BOTTLES Gram Stain Report Called to,Read Back By and Verified With: SPANGLER,E. AT 1310 ON 06/07/2016 BY BAUGHAM,M. Performed at [facility] ID to follow Performed at [facility]    Culture STAPHYLOCOCCUS SPECIES (COAGULASE NEGATIVE) (A)  Final   Report Status 06/09/2016 FINAL  Final   Organism ID, Bacteria STAPHYLOCOCCUS SPECIES (COAGULASE NEGATIVE)  Final      Susceptibility   Staphylococcus species (coagulase negative) - MIC*    CIPROFLOXACIN <=0.5 SENSITIVE Sensitive     ERYTHROMYCIN >=8 RESISTANT Resistant     GENTAMICIN <=0.5 SENSITIVE Sensitive     OXACILLIN <=0.25 SENSITIVE Sensitive     TETRACYCLINE <=1 SENSITIVE Sensitive     VANCOMYCIN <=0.5 SENSITIVE Sensitive     TRIMETH/SULFA <=10 SENSITIVE Sensitive     CLINDAMYCIN >=8 RESISTANT Resistant     RIFAMPIN <=0.5 SENSITIVE Sensitive     Inducible Clindamycin NEGATIVE Sensitive     *  STAPHYLOCOCCUS SPECIES (COAGULASE NEGATIVE)  Blood Culture ID Panel (Reflexed)     Status: Abnormal   Collection Time: 06/06/16  5:16 PM  Result Value Ref Range Status   Enterococcus species NOT DETECTED NOT DETECTED Final   Vancomycin resistance NOT DETECTED NOT DETECTED Final   Listeria monocytogenes NOT DETECTED NOT DETECTED Final   Staphylococcus species DETECTED (A) NOT DETECTED Final    Comment: CRITICAL RESULT CALLED TO, READ BACK BY AND VERIFIED WITH: F.SMITH 06/07/16 _0  BY V.WILKINS    Staphylococcus aureus NOT DETECTED NOT DETECTED Final   Methicillin resistance NOT DETECTED NOT DETECTED Final   Streptococcus species NOT DETECTED NOT DETECTED Final   Streptococcus agalactiae NOT DETECTED NOT DETECTED Final   Streptococcus pneumoniae NOT DETECTED NOT DETECTED Final   Streptococcus pyogenes NOT DETECTED NOT DETECTED Final   Acinetobacter baumannii NOT DETECTED NOT DETECTED Final   Enterobacteriaceae species NOT DETECTED NOT DETECTED Final   Enterobacter cloacae complex NOT DETECTED NOT DETECTED Final   Escherichia coli NOT DETECTED NOT DETECTED Final   Klebsiella oxytoca NOT DETECTED NOT DETECTED Final   Klebsiella pneumoniae NOT DETECTED NOT DETECTED Final   Proteus species NOT DETECTED NOT DETECTED Final   Serratia marcescens NOT DETECTED NOT DETECTED Final   Carbapenem resistance NOT DETECTED NOT DETECTED Final   Haemophilus influenzae NOT DETECTED NOT DETECTED Final   Neisseria meningitidis NOT DETECTED NOT DETECTED Final   Pseudomonas aeruginosa NOT DETECTED NOT DETECTED Final   Candida albicans NOT DETECTED NOT DETECTED Final   Candida glabrata NOT DETECTED NOT DETECTED Final   Candida krusei NOT DETECTED NOT DETECTED Final   Candida parapsilosis NOT DETECTED NOT DETECTED Final   Candida tropicalis NOT DETECTED NOT DETECTED Final    Comment: Performed at [facility]  Culture, blood (routine x 2)     Status: Abnormal   Collection Time: 06/06/16  5:45 PM   Result Value Ref Range Status   Specimen Description BLOOD PORTA CATH DRAWN BY RN  Final   Special Requests BOTTLES DRAWN AEROBIC AND ANAEROBIC 6CC EACH  Final   Culture  Setup Time   Final    GRAM POSITIVE COCCI IN CLUSTERS RECOVERED FROM BOTH BOTTLES Gram Stain Report Called to,Read Back By and Verified With: SPANGLER,E. AT 45 ON 06/07/2016 BY BAUGHAM,M. Performed at [facility]    Culture (A)  Final    STAPHYLOCOCCUS SPECIES (COAGULASE NEGATIVE) SUSCEPTIBILITIES PERFORMED ON PREVIOUS CULTURE WITHIN THE LAST 5 DAYS. Performed at [facility]    Report Status 06/09/2016 FINAL  Final  MRSA PCR Screening     Status: None   Collection Time: 06/07/16  2:00 AM  Result Value Ref Range Status   MRSA by PCR NEGATIVE NEGATIVE Final    Comment:        The GeneXpert MRSA Assay (FDA approved for NASAL specimens only), is one component of a comprehensive MRSA colonization surveillance program. It is not intended to diagnose MRSA infection nor to guide or monitor treatment for MRSA infections.   Culture, blood (Routine X 2) w Reflex to ID Panel     Status: None (Preliminary result)   Collection Time: 06/09/16  2:34 PM  Result Value Ref Range Status   Specimen Description BLOOD PORTA CATH DRAWN BY RN  Final   Special Requests   Final    BOTTLES DRAWN AEROBIC AND ANAEROBIC AEB=8CC ANA=6CC   Culture NO GROWTH 2 DAYS  Final   Report Status PENDING  Incomplete  Culture, blood (Routine X  2) w Reflex to ID Panel     Status: None (Preliminary result)   Collection Time: 06/09/16  2:45 PM  Result Value Ref Range Status   Specimen Description BLOOD RIGHT FOREARM  Final   Special Requests   Final    BOTTLES DRAWN AEROBIC AND ANAEROBIC AEB=8CC ANA=6CC   Culture NO GROWTH 2 DAYS  Final   Report Status PENDING  Incomplete   Studies/Results: No results found.  Medications:  Prior to Admission:  Prescriptions prior to admission  Medication Sig Dispense Refill Last Dose  .  acetaminophen (TYLENOL) 325 MG tablet Take 650 mg by mouth every 6 (six) hours as needed for moderate pain or fever.    Past Week at Unknown time  . albuterol (PROVENTIL) (2.5 MG/3ML) 0.083% nebulizer solution Take 2.5 mg by nebulization every 6 (six) hours as needed for wheezing or shortness of breath.   Past Week at Unknown time  . ALPRAZolam (XANAX) 0.5 MG tablet Take 0.5 mg by mouth 3 (three) times daily as needed for anxiety or sleep.    Past Week at Unknown time  . amLODipine (NORVASC) 10 MG tablet Take 10 mg by mouth daily.   06/08/2016 at Unknown time  . b complex-vitamin c-folic acid (NEPHRO-VITE) 0.8 MG TABS tablet Take 1 tablet by mouth 3 (three) times daily.    Past Week at Unknown time  . diphenhydrAMINE (BENADRYL) 25 mg capsule Take 25 mg by mouth every 6 (six) hours as needed for allergies.   unknown  . insulin NPH-insulin regular (NOVOLIN 70/30) (70-30) 100 UNIT/ML injection Inject into the skin 2 (two) times daily. Per sliding scale   06/07/2016 at Unknown time  . metoprolol (LOPRESSOR) 50 MG tablet Take 0.5 tablets (25 mg total) by mouth 2 (two) times daily. 30 tablet 12 Past Week at Unknown time  . ondansetron (ZOFRAN-ODT) 4 MG disintegrating tablet Take 4 mg by mouth every 8 (eight) hours as needed for nausea or vomiting.   Past Week at Unknown time  . Oxycodone HCl 10 MG TABS Take 1 tablet (10 mg total) by mouth 3 (three) times daily as needed (pain). 15 tablet 0 06/07/2016 at Unknown time  . promethazine (PHENERGAN) 12.5 MG tablet Take 12.5 mg by mouth every 6 (six) hours as needed for nausea or vomiting (alternates with Zofran).   Past Month at Unknown time  . sevelamer carbonate (RENVELA) 800 MG tablet Take 1,600 mg by mouth 3 (three) times daily with meals. Takes 2 tablets with meals and 1 with snacks   06/08/2016 at Unknown time  . Sucroferric Oxyhydroxide (VELPHORO PO) Take 500 mg by mouth daily with breakfast.    Past Week at Unknown time   Scheduled: .  ceFAZolin (ANCEF) IV  2  g Intravenous Q T,Th,Sa-HD  . heparin  5,000 Units Subcutaneous Q8H  . insulin aspart  0-5 Units Subcutaneous QHS  . insulin aspart  0-9 Units Subcutaneous TID WC  . metoprolol  25 mg Oral BID  . multivitamin  1 tablet Oral QHS  . sevelamer carbonate  2,400 mg Oral TID WC  . sodium chloride flush  3 mL Intravenous Q12H  . sodium chloride flush  3 mL Intravenous Q12H   Continuous:  CLE:XNTZGY chloride, sodium chloride, sodium chloride, sodium chloride, sodium chloride, sodium chloride, sodium chloride, acetaminophen **OR** acetaminophen, albuterol, ALPRAZolam, alteplase, diphenhydrAMINE, heparin, lidocaine (PF), lidocaine-prilocaine, ondansetron, oxyCODONE, pentafluoroprop-tetrafluoroeth, promethazine, sevelamer carbonate, sodium chloride flush  Assesment: She was admitted with staph bacteremia. This is probably from her  dialysis catheter. Blood cultures are negative now at 48 hours. She is on appropriate treatment.  She has diabetes but she says at home she has not been taking her insulin and I discontinued her insulin here because her blood sugars have been excellent  She has obesity but has lost approximately 150 pounds.  She has hypertension which is doing well and in fact her blood pressure has been somewhat low so I think we can probably modify her medications  She has end-stage renal disease on dialysis  She has chronic lymphedema and that is unchanged  She has chronic back and leg pain likely from arthritis related to her weight.  She has chronic anxiety and that's doing better  She has had abnormalities of her gallbladder and previously was not felt to be an operative candidate because of her morbid obesity and starting on dialysis. However she's doing much better now and I have discussed her situation with Dr. Arnoldo Morale who will see her here in the hospital set her up for outpatient evaluation and see if she is now a candidate for surgery Principal Problem:   Fever Active  Problems:   Diabetes mellitus ([place])   HTN (hypertension)   UTI (lower urinary tract infection)   ESRD (end stage renal disease) ([place])    Plan: Discharge home today she will receive 6 weeks of IV antibiotics which will be given after dialysis    LOS: 5 days   [name] L 06/11/2016, 8:29 AM

## 2016-06-11 NOTE — Discharge Planning (Addendum)
Pt IV and tele removed.  DC papers given, explained and educated.  Pt told of suggested FU appts and script sent to pharm.  RN assessment and VS revealed stability for DC to home.  Pt will be wheeled to front and family transporting home via car.

## 2016-06-11 NOTE — Discharge Summary (Signed)
Physician Discharge Summary  Patient ID: [name] MRN: [id number] DOB/AGE: 50-Nov-1967 50 y.o. Primary Care Physician:BEFEKADU,BELAYENH S, MD Admit date: 06/06/2016 Discharge date: 06/11/2016    Discharge Diagnoses:   Principal Problem:   Fever Active Problems:   Lymphedema of lower extremity   Diabetes mellitus ([place])   Morbid obesity (HCC)   HTN (hypertension)   Obesity hypoventilation syndrome (HCC)   Chronic respiratory failure with hypoxia (HCC)   Chronic cholecystitis   UTI (lower urinary tract infection)   ESRD (end stage renal disease) (HCC)   Coagulase negative Staphylococcus bacteremia     Medication List    STOP taking these medications        insulin NPH-regular Human (70-30) 100 UNIT/ML injection  Commonly known as:  NOVOLIN 70/30      TAKE these medications        acetaminophen 325 MG tablet  Commonly known as:  TYLENOL  Take 650 mg by mouth every 6 (six) hours as needed for moderate pain or fever.     albuterol (2.5 MG/3ML) 0.083% nebulizer solution  Commonly known as:  PROVENTIL  Take 2.5 mg by nebulization every 6 (six) hours as needed for wheezing or shortness of breath.     ALPRAZolam 0.5 MG tablet  Commonly known as:  XANAX  Take 0.5 mg by mouth 3 (three) times daily as needed for anxiety or sleep.     amLODipine 10 MG tablet  Commonly known as:  NORVASC  Take 0.5 tablets (5 mg total) by mouth daily.     b complex-vitamin c-folic acid 0.8 MG Tabs tablet  Take 1 tablet by mouth 3 (three) times daily.     diphenhydrAMINE 25 mg capsule  Commonly known as:  BENADRYL  Take 25 mg by mouth every 6 (six) hours as needed for allergies.     metoprolol 50 MG tablet  Commonly known as:  LOPRESSOR  Take 0.5 tablets (25 mg total) by mouth 2 (two) times daily.     ondansetron 4 MG disintegrating tablet  Commonly known as:  ZOFRAN-ODT  Take 4 mg by mouth every 8 (eight) hours as needed for nausea or vomiting.     Oxycodone HCl 10 MG Tabs   Take 1 tablet (10 mg total) by mouth 3 (three) times daily as needed (pain).     promethazine 12.5 MG tablet  Commonly known as:  PHENERGAN  Take 12.5 mg by mouth every 6 (six) hours as needed for nausea or vomiting (alternates with Zofran).     sevelamer carbonate 800 MG tablet  Commonly known as:  RENVELA  Take 1,600 mg by mouth 3 (three) times daily with meals. Takes 2 tablets with meals and 1 with snacks     VELPHORO PO  Take 500 mg by mouth daily with breakfast.        Discharged Condition:Improved    Consults: Nephrology general surgery  Significant Diagnostic Studies: Dg Chest 2 View  06/06/2016  CLINICAL DATA:  Fever, body aches. EXAM: CHEST  2 VIEW COMPARISON:  04/07/2016 FINDINGS: Large bore right internal jugular approach central venous catheter, and left subclavian approach injectable port are stable in position. The cardiac silhouette is enlarged. Mediastinal contours appear intact. There is no evidence of focal airspace consolidation, pleural effusion or pneumothorax. There is mild interstitial pulmonary edema. Osseous structures are without acute abnormality. Soft tissues are grossly normal. IMPRESSION: Enlarged cardiac silhouette with mild interstitial pulmonary edema. Electronically Signed   By: Linwood Dibbles.D.  On: 06/06/2016 18:16   Ct Chest Wo Contrast  06/06/2016  CLINICAL DATA:  Chills with body aches and generalized abdominal pain. Increasing shortness of breath beginning this afternoon. Dialysis yesterday. EXAM: CT CHEST, ABDOMEN AND PELVIS WITHOUT CONTRAST TECHNIQUE: Multidetector CT imaging of the chest, abdomen and pelvis was performed following the standard protocol without IV contrast. COMPARISON:  CT abdomen/ pelvis 12/08/2015 and 12/02/2014 as well as CT chest 11/06/2011 FINDINGS: CT CHEST Lungs are well inflated without consolidation or effusion. Minimal linear scarring over the lingula. Airways are normal. Left subclavian Port-A-Cath has tip  over the SVC. Right IJ dialysis catheter has tip over the right atrium. Mild cardiomegaly. Mediastinal structures otherwise within normal. No hilar or mediastinal adenopathy. CT ABDOMEN AND PELVIS The liver, spleen, pancreas and adrenal glands are within normal. Gallbladder is slightly contracted as there is a small focus of air over the nondependent portion of the gallbladder. Subtle hazy attenuation of the adjacent fat as cannot exclude an inflammatory process involving the gallbladder. Kidneys are normal in size without hydronephrosis or nephrolithiasis. Ureters are within normal. Vascular structures are unremarkable. The stomach is within normal. Small bowel is within normal. Appendix is not well visualized. Minimal diverticulosis of the colon. Mesentery is within normal. Small umbilical hernia containing only peritoneal fat unchanged. Pelvic images demonstrate surgical absence of the uterus. The bladder and rectum are within normal. Right ovary is smaller measuring 5 cm. Left ovary not visualized. Remaining bones soft tissues are within normal. IMPRESSION: No acute cardiopulmonary disease. Small focus of air within the nondependent portion of the gallbladder. Gallbladder slightly contracted with hazy attenuation of the adjacent fat as cannot exclude an acute inflammatory or infectious process. Consider right upper quadrant ultrasound for further evaluation. Minimal diverticulosis of the colon. Small umbilical hernia containing only mesenteric fat. Mild stable cardiomegaly. Electronically Signed   By: Marin Olp M.D.   On: 06/06/2016 20:29   Ct Renal Stone Study  06/06/2016  CLINICAL DATA:  Chills with body aches and generalized abdominal pain. Increasing shortness of breath beginning this afternoon. Dialysis yesterday. EXAM: CT CHEST, ABDOMEN AND PELVIS WITHOUT CONTRAST TECHNIQUE: Multidetector CT imaging of the chest, abdomen and pelvis was performed following the standard protocol without IV contrast.  COMPARISON:  CT abdomen/ pelvis 12/08/2015 and 12/02/2014 as well as CT chest 11/06/2011 FINDINGS: CT CHEST Lungs are well inflated without consolidation or effusion. Minimal linear scarring over the lingula. Airways are normal. Left subclavian Port-A-Cath has tip over the SVC. Right IJ dialysis catheter has tip over the right atrium. Mild cardiomegaly. Mediastinal structures otherwise within normal. No hilar or mediastinal adenopathy. CT ABDOMEN AND PELVIS The liver, spleen, pancreas and adrenal glands are within normal. Gallbladder is slightly contracted as there is a small focus of air over the nondependent portion of the gallbladder. Subtle hazy attenuation of the adjacent fat as cannot exclude an inflammatory process involving the gallbladder. Kidneys are normal in size without hydronephrosis or nephrolithiasis. Ureters are within normal. Vascular structures are unremarkable. The stomach is within normal. Small bowel is within normal. Appendix is not well visualized. Minimal diverticulosis of the colon. Mesentery is within normal. Small umbilical hernia containing only peritoneal fat unchanged. Pelvic images demonstrate surgical absence of the uterus. The bladder and rectum are within normal. Right ovary is smaller measuring 5 cm. Left ovary not visualized. Remaining bones soft tissues are within normal. IMPRESSION: No acute cardiopulmonary disease. Small focus of air within the nondependent portion of the gallbladder. Gallbladder slightly contracted with hazy  attenuation of the adjacent fat as cannot exclude an acute inflammatory or infectious process. Consider right upper quadrant ultrasound for further evaluation. Minimal diverticulosis of the colon. Small umbilical hernia containing only mesenteric fat. Mild stable cardiomegaly. Electronically Signed   By: Marin Olp M.D.   On: 06/06/2016 20:29   US Abdomen Limited Ruq  06/07/2016  CLINICAL DATA:  Postpartum fever of unknown etiology EXAM: US ABDOMEN  LIMITED - RIGHT UPPER QUADRANT COMPARISON:  Abdominal CT scan dated June 06, 2016 and December 08, 2015. FINDINGS: Gallbladder: The gallbladder is contracted. There is dirty shadowing which may reflect air previously demonstrated in the biliary tree and gallbladder. No discrete stones are observed. Common bile duct: Diameter: 4.5 mm Liver: The hepatic echotexture is mildly heterogeneous. No definite intra hepatic ductal air collections are observed. IMPRESSION: Limited study revealing a contracted gallbladder. This may contain air and reflect emphysematous cholecystitis. No definite stones are observed on this study. On the CT scan of December 08, 2015 tiny calcifications were noted in the gallbladder likely reflecting stones. No intra or extrahepatic bile duct gas collections are observed. These results will be called to the ordering clinician or representative by the Radiologist Assistant, and communication documented in the PACS or zVision Dashboard. Electronically Signed   By: David  Martinique M.D.   On: 06/07/2016 11:00    Lab Results: Basic Metabolic Panel:  Recent Labs  06/09/16 0445 06/10/16 1045  NA 135 134*  K 3.8 4.3  CL 98* 98*  CO2 29 26  GLUCOSE 84 177*  BUN 30* 40*  CREATININE 4.98* 7.02*  CALCIUM 8.4* 8.7*  PHOS 5.5* 5.7*   Liver Function Tests:  Recent Labs  06/09/16 0445 06/10/16 1045  ALBUMIN 2.7* 3.0*     CBC:  Recent Labs  06/09/16 0445 06/10/16 1045  WBC 6.2 6.3  HGB 9.7* 10.4*  HCT 29.6* 32.0*  MCV 93.4 94.4  PLT 243 245    Recent Results (from the past 240 hour(s))  Culture, blood (routine x 2)     Status: Abnormal   Collection Time: 06/06/16  5:16 PM  Result Value Ref Range Status   Specimen Description BLOOD RIGHT HAND  Final   Special Requests BOTTLES DRAWN AEROBIC AND ANAEROBIC 6CC EACH  Final   Culture  Setup Time   Final    GRAM POSITIVE COCCI IN CLUSTERS RECOVERED FROM BOTH BOTTLES Gram Stain Report Called to,Read Back By and Verified  With: SPANGLER,E. AT 1310 ON 06/07/2016 BY BAUGHAM,M. Performed at [facility] ID to follow Performed at [facility] (COAGULASE NEGATIVE) (A)  Final   Report Status 06/09/2016 FINAL  Final   Organism ID, Bacteria STAPHYLOCOCCUS SPECIES (COAGULASE NEGATIVE)  Final      Susceptibility   Staphylococcus species (coagulase negative) - MIC*    CIPROFLOXACIN <=0.5 SENSITIVE Sensitive     ERYTHROMYCIN >=8 RESISTANT Resistant     GENTAMICIN <=0.5 SENSITIVE Sensitive     OXACILLIN <=0.25 SENSITIVE Sensitive     TETRACYCLINE <=1 SENSITIVE Sensitive     VANCOMYCIN <=0.5 SENSITIVE Sensitive     TRIMETH/SULFA <=10 SENSITIVE Sensitive     CLINDAMYCIN >=8 RESISTANT Resistant     RIFAMPIN <=0.5 SENSITIVE Sensitive     Inducible Clindamycin NEGATIVE Sensitive     * STAPHYLOCOCCUS SPECIES (COAGULASE NEGATIVE)  Blood Culture ID Panel (Reflexed)     Status: Abnormal   Collection Time: 06/06/16  5:16 PM  Result Value Ref Range Status  Enterococcus species NOT DETECTED NOT DETECTED Final   Vancomycin resistance NOT DETECTED NOT DETECTED Final   Listeria monocytogenes NOT DETECTED NOT DETECTED Final   Staphylococcus species DETECTED (A) NOT DETECTED Final    Comment: CRITICAL RESULT CALLED TO, READ BACK BY AND VERIFIED WITH: F.SMITH 06/07/16 _0  BY V.WILKINS    Staphylococcus aureus NOT DETECTED NOT DETECTED Final   Methicillin resistance NOT DETECTED NOT DETECTED Final   Streptococcus species NOT DETECTED NOT DETECTED Final   Streptococcus agalactiae NOT DETECTED NOT DETECTED Final   Streptococcus pneumoniae NOT DETECTED NOT DETECTED Final   Streptococcus pyogenes NOT DETECTED NOT DETECTED Final   Acinetobacter baumannii NOT DETECTED NOT DETECTED Final   Enterobacteriaceae species NOT DETECTED NOT DETECTED Final   Enterobacter cloacae complex NOT DETECTED NOT DETECTED Final   Escherichia coli NOT DETECTED NOT DETECTED Final   Klebsiella  oxytoca NOT DETECTED NOT DETECTED Final   Klebsiella pneumoniae NOT DETECTED NOT DETECTED Final   Proteus species NOT DETECTED NOT DETECTED Final   Serratia marcescens NOT DETECTED NOT DETECTED Final   Carbapenem resistance NOT DETECTED NOT DETECTED Final   Haemophilus influenzae NOT DETECTED NOT DETECTED Final   Neisseria meningitidis NOT DETECTED NOT DETECTED Final   Pseudomonas aeruginosa NOT DETECTED NOT DETECTED Final   Candida albicans NOT DETECTED NOT DETECTED Final   Candida glabrata NOT DETECTED NOT DETECTED Final   Candida krusei NOT DETECTED NOT DETECTED Final   Candida parapsilosis NOT DETECTED NOT DETECTED Final   Candida tropicalis NOT DETECTED NOT DETECTED Final    Comment: Performed at [facility]  Culture, blood (routine x 2)     Status: Abnormal   Collection Time: 06/06/16  5:45 PM  Result Value Ref Range Status   Specimen Description BLOOD PORTA CATH DRAWN BY RN  Final   Special Requests BOTTLES DRAWN AEROBIC AND ANAEROBIC 6CC EACH  Final   Culture  Setup Time   Final    GRAM POSITIVE COCCI IN CLUSTERS RECOVERED FROM BOTH BOTTLES Gram Stain Report Called to,Read Back By and Verified With: SPANGLER,E. AT 67 ON 06/07/2016 BY BAUGHAM,M. Performed at [facility]    Culture (A)  Final    STAPHYLOCOCCUS SPECIES (COAGULASE NEGATIVE) SUSCEPTIBILITIES PERFORMED ON PREVIOUS CULTURE WITHIN THE LAST 5 DAYS. Performed at [facility]    Report Status 06/09/2016 FINAL  Final  MRSA PCR Screening     Status: None   Collection Time: 06/07/16  2:00 AM  Result Value Ref Range Status   MRSA by PCR NEGATIVE NEGATIVE Final    Comment:        The GeneXpert MRSA Assay (FDA approved for NASAL specimens only), is one component of a comprehensive MRSA colonization surveillance program. It is not intended to diagnose MRSA infection nor to guide or monitor treatment for MRSA infections.   Culture, blood (Routine X 2) w Reflex to ID Panel     Status: None  (Preliminary result)   Collection Time: 06/09/16  2:34 PM  Result Value Ref Range Status   Specimen Description BLOOD PORTA CATH DRAWN BY RN  Final   Special Requests   Final    BOTTLES DRAWN AEROBIC AND ANAEROBIC AEB=8CC ANA=6CC   Culture NO GROWTH 2 DAYS  Final   Report Status PENDING  Incomplete  Culture, blood (Routine X 2) w Reflex to ID Panel     Status: None (Preliminary result)   Collection Time: 06/09/16  2:45 PM  Result Value Ref Range Status   Specimen  Description BLOOD RIGHT FOREARM  Final   Special Requests   Final    BOTTLES DRAWN AEROBIC AND ANAEROBIC AEB=8CC ANA=6CC   Culture NO GROWTH 2 DAYS  Final   Report Status PENDING  Incomplete     Hospital Course: This is a 50 year old who is on chronic hemodialysis with a permacath. She was in her usual state of fair health when she developed fever. She came to the emergency department she was found to have fever of approximately 103. Blood culture was positive for staph aureus which was coagulase-negative. She was initially treated with broad-spectrum antibiotics and once the cultures were obtained and identification was complete she was changed to Ancef after dialysis. She improved over the next several days. She had abnormalities in the area of her gallbladder on CT and ultrasound and I discussed her situation with Dr. Arnoldo Morale. She has been known to have what is thought to be chronic cholecystitis in the past but when she was evaluated last time she had just started hemodialysis and because of her severe morbid obesity and multiple other comorbidities she was not felt to be a surgical candidate. She is being reevaluated to see if at a later time she would be a surgical candidate. She will require 6 weeks of IV Ancef 2 g after each dialysis and the start date of her antibiotics was 06/09/2016. Current blood culture is negative at 48 hours  Discharge Exam: Blood pressure 103/60, pulse 65, temperature 98.5 F (36.9 C), temperature  source Oral, resp. rate 20, height 5' 5" (1.651 m), weight 136.5 kg (300 lb 14.9 oz), SpO2 96 %. She is obese but has lost over 150 pounds. She looks comfortable. She has chronic lymphedema.  Disposition: Home with home health services to resume dialysis. I did not the Ancef as it will be ordered at her dialysis unit. I did decrease her blood pressure medications because she's been relatively hypotensive and I stopped her insulin at least for now      Discharge Instructions    Discharge patient    Complete by:  As directed      Face-to-face encounter (required for Medicare/Medicaid patients)    Complete by:  As directed   I Jayesh Marbach L certify that this patient is under my care and that I, or a nurse practitioner or physician's assistant working with me, had a face-to-face encounter that meets the physician face-to-face encounter requirements with this patient on 06/11/2016. The encounter with the patient was in whole, or in part for the following medical condition(s) which is the primary reason for home health care (List medical condition): Staphylococcus bacteremia  The encounter with the patient was in whole, or in part, for the following medical condition, which is the primary reason for home health care:  Staphylococcus bacteremia  I certify that, based on my findings, the following services are medically necessary home health services:   Nursing Physical therapy    Reason for Medically Necessary Home Health Services:  Skilled Nursing- Change/Decline in Patient Status  My clinical findings support the need for the above services:  Unable to leave home safely without assistance and/or assistive device  Further, I certify that my clinical findings support that this patient is homebound due to:  Ambulates short distances less than 300 feet     Home Health    Complete by:  As directed   To provide the following care/treatments:   PT RN  Follow-up Information    Call  Jamesetta So, MD.   Specialty:  General Surgery   Why:  As needed   Contact information:   [id number] [place] [address] [phone]       Signed: Alonza Bogus   06/11/2016, 8:42 AM

## 2016-06-11 NOTE — Progress Notes (Signed)
Subjective: Interval History: Patient does not have any complaints. She denies any fever, chills or sweating. Denies also any difficulty breathing.  Objective: Vital signs in last 24 hours: Temp:  [97.7 F (36.5 C)-98.6 F (37 C)] 98.5 F (36.9 C) (06/23 0612) Pulse Rate:  [55-74] 65 (06/23 0612) Resp:  [16-21] 20 (06/23 0612) BP: (96-139)/(50-77) 103/60 mmHg (06/23 0612) SpO2:  [92 %-100 %] 96 % (06/23 0612) Weight:  [136.5 kg (300 lb 14.9 oz)-137.2 kg (302 lb 7.5 oz)] 136.5 kg (300 lb 14.9 oz) (06/23 0612) Weight change:   Intake/Output from previous day: 06/22 0701 - 06/23 0700 In: 440 [P.O.:240; IV Piggyback:200] Out: 3300  Intake/Output this shift:   Generally patient is alert and in no apparent distress Chest: Decreased breath sound bilaterally Heart exam regular rate and rhythm no murmur  extremities she has trace edema/venous stasis.   Lab Results:  Recent Labs  06/09/16 0445 06/10/16 1045  WBC 6.2 6.3  HGB 9.7* 10.4*  HCT 29.6* 32.0*  PLT 243 245   BMET:   Recent Labs  06/09/16 0445 06/10/16 1045  NA 135 134*  K 3.8 4.3  CL 98* 98*  CO2 29 26  GLUCOSE 84 177*  BUN 30* 40*  CREATININE 4.98* 7.02*  CALCIUM 8.4* 8.7*   No results for input(s): PTH in the last 72 hours. Iron Studies: No results for input(s): IRON, TIBC, TRANSFERRIN, FERRITIN in the last 72 hours.  Studies/Results: No results found.  I have reviewed the patient's current medications.  Assessment/Plan: Problem #1 end-stage renal disease: She is status post hemodialysis Yesterday. Her potassium is normal and she doesn't have any uremic signs and symptoms. Problem #2 fever: Presently patient is afebrile and her white blood cell count is normal. Blood culture is positive for coag-negative staph sensitive to oxacillin. Presently patient is started on Ancef. The suggestion is to continue with antibiotics for 14 days. Repeat blood culture from 06/09/16 does not have any gross. Problem #3  metabolic bone disease: Her calcium is in range But her phosphorus is slightly high. Patient remained on a binder. Problem #4 anemia: Her hemoglobin has come up to a target goal.. Patient is on Epogen Problem #5 hypotension: Possibly a combination of sepsis and antihypertensive medications. Her blood pressure slightly low but seems to be better. Problem #6 fluid management: Patient presents today doesn't have significant signs of fluid overload. We are able to fall about 3.3 L yesterday. Problem #7 nutrition Plan: 1] patient does require dialysis today 2] her next dialysis will be tomorrow and could be done as an outpatient if patient is discharged.   LOS: 5 days   [name] S 06/11/2016,8:05 AM   [email or web address]

## 2016-06-12 DIAGNOSIS — D509 Iron deficiency anemia, unspecified: Secondary | ICD-10-CM | POA: Diagnosis not present

## 2016-06-12 DIAGNOSIS — R7881 Bacteremia: Secondary | ICD-10-CM | POA: Diagnosis not present

## 2016-06-12 DIAGNOSIS — N186 End stage renal disease: Secondary | ICD-10-CM | POA: Diagnosis not present

## 2016-06-12 DIAGNOSIS — N2581 Secondary hyperparathyroidism of renal origin: Secondary | ICD-10-CM | POA: Diagnosis not present

## 2016-06-12 DIAGNOSIS — B9561 Methicillin susceptible Staphylococcus aureus infection as the cause of diseases classified elsewhere: Secondary | ICD-10-CM | POA: Diagnosis not present

## 2016-06-12 DIAGNOSIS — D631 Anemia in chronic kidney disease: Secondary | ICD-10-CM | POA: Diagnosis not present

## 2016-06-14 LAB — CULTURE, BLOOD (ROUTINE X 2)
CULTURE: NO GROWTH
CULTURE: NO GROWTH

## 2016-06-15 DIAGNOSIS — R7881 Bacteremia: Secondary | ICD-10-CM | POA: Diagnosis not present

## 2016-06-15 DIAGNOSIS — N2581 Secondary hyperparathyroidism of renal origin: Secondary | ICD-10-CM | POA: Diagnosis not present

## 2016-06-15 DIAGNOSIS — D509 Iron deficiency anemia, unspecified: Secondary | ICD-10-CM | POA: Diagnosis not present

## 2016-06-15 DIAGNOSIS — N186 End stage renal disease: Secondary | ICD-10-CM | POA: Diagnosis not present

## 2016-06-15 DIAGNOSIS — B9561 Methicillin susceptible Staphylococcus aureus infection as the cause of diseases classified elsewhere: Secondary | ICD-10-CM | POA: Diagnosis not present

## 2016-06-15 DIAGNOSIS — D631 Anemia in chronic kidney disease: Secondary | ICD-10-CM | POA: Diagnosis not present

## 2016-06-16 DIAGNOSIS — N186 End stage renal disease: Secondary | ICD-10-CM | POA: Diagnosis not present

## 2016-06-16 DIAGNOSIS — E1122 Type 2 diabetes mellitus with diabetic chronic kidney disease: Secondary | ICD-10-CM | POA: Diagnosis not present

## 2016-06-16 DIAGNOSIS — I132 Hypertensive heart and chronic kidney disease with heart failure and with stage 5 chronic kidney disease, or end stage renal disease: Secondary | ICD-10-CM | POA: Diagnosis not present

## 2016-06-16 DIAGNOSIS — I890 Lymphedema, not elsewhere classified: Secondary | ICD-10-CM | POA: Diagnosis not present

## 2016-06-16 DIAGNOSIS — I5032 Chronic diastolic (congestive) heart failure: Secondary | ICD-10-CM | POA: Diagnosis not present

## 2016-06-16 DIAGNOSIS — R531 Weakness: Secondary | ICD-10-CM | POA: Diagnosis not present

## 2016-06-17 DIAGNOSIS — R7881 Bacteremia: Secondary | ICD-10-CM | POA: Diagnosis not present

## 2016-06-17 DIAGNOSIS — D631 Anemia in chronic kidney disease: Secondary | ICD-10-CM | POA: Diagnosis not present

## 2016-06-17 DIAGNOSIS — N186 End stage renal disease: Secondary | ICD-10-CM | POA: Diagnosis not present

## 2016-06-17 DIAGNOSIS — B9561 Methicillin susceptible Staphylococcus aureus infection as the cause of diseases classified elsewhere: Secondary | ICD-10-CM | POA: Diagnosis not present

## 2016-06-17 DIAGNOSIS — N2581 Secondary hyperparathyroidism of renal origin: Secondary | ICD-10-CM | POA: Diagnosis not present

## 2016-06-17 DIAGNOSIS — D509 Iron deficiency anemia, unspecified: Secondary | ICD-10-CM | POA: Diagnosis not present

## 2016-06-18 DIAGNOSIS — I5032 Chronic diastolic (congestive) heart failure: Secondary | ICD-10-CM | POA: Diagnosis not present

## 2016-06-18 DIAGNOSIS — E1122 Type 2 diabetes mellitus with diabetic chronic kidney disease: Secondary | ICD-10-CM | POA: Diagnosis not present

## 2016-06-18 DIAGNOSIS — N186 End stage renal disease: Secondary | ICD-10-CM | POA: Diagnosis not present

## 2016-06-18 DIAGNOSIS — R531 Weakness: Secondary | ICD-10-CM | POA: Diagnosis not present

## 2016-06-18 DIAGNOSIS — I132 Hypertensive heart and chronic kidney disease with heart failure and with stage 5 chronic kidney disease, or end stage renal disease: Secondary | ICD-10-CM | POA: Diagnosis not present

## 2016-06-18 DIAGNOSIS — I890 Lymphedema, not elsewhere classified: Secondary | ICD-10-CM | POA: Diagnosis not present

## 2016-06-18 DIAGNOSIS — Z992 Dependence on renal dialysis: Secondary | ICD-10-CM | POA: Diagnosis not present

## 2016-06-19 DIAGNOSIS — B9561 Methicillin susceptible Staphylococcus aureus infection as the cause of diseases classified elsewhere: Secondary | ICD-10-CM | POA: Diagnosis not present

## 2016-06-19 DIAGNOSIS — R7881 Bacteremia: Secondary | ICD-10-CM | POA: Diagnosis not present

## 2016-06-19 DIAGNOSIS — Z992 Dependence on renal dialysis: Secondary | ICD-10-CM | POA: Diagnosis not present

## 2016-06-19 DIAGNOSIS — N2581 Secondary hyperparathyroidism of renal origin: Secondary | ICD-10-CM | POA: Diagnosis not present

## 2016-06-19 DIAGNOSIS — N186 End stage renal disease: Secondary | ICD-10-CM | POA: Diagnosis not present

## 2016-06-19 DIAGNOSIS — D631 Anemia in chronic kidney disease: Secondary | ICD-10-CM | POA: Diagnosis not present

## 2016-06-19 DIAGNOSIS — D509 Iron deficiency anemia, unspecified: Secondary | ICD-10-CM | POA: Diagnosis not present

## 2016-06-22 DIAGNOSIS — D509 Iron deficiency anemia, unspecified: Secondary | ICD-10-CM | POA: Diagnosis not present

## 2016-06-22 DIAGNOSIS — R7881 Bacteremia: Secondary | ICD-10-CM | POA: Diagnosis not present

## 2016-06-22 DIAGNOSIS — D631 Anemia in chronic kidney disease: Secondary | ICD-10-CM | POA: Diagnosis not present

## 2016-06-22 DIAGNOSIS — Z992 Dependence on renal dialysis: Secondary | ICD-10-CM | POA: Diagnosis not present

## 2016-06-22 DIAGNOSIS — N2581 Secondary hyperparathyroidism of renal origin: Secondary | ICD-10-CM | POA: Diagnosis not present

## 2016-06-22 DIAGNOSIS — N186 End stage renal disease: Secondary | ICD-10-CM | POA: Diagnosis not present

## 2016-06-23 DIAGNOSIS — I10 Essential (primary) hypertension: Secondary | ICD-10-CM | POA: Diagnosis not present

## 2016-06-23 DIAGNOSIS — I890 Lymphedema, not elsewhere classified: Secondary | ICD-10-CM | POA: Diagnosis not present

## 2016-06-23 DIAGNOSIS — N185 Chronic kidney disease, stage 5: Secondary | ICD-10-CM | POA: Diagnosis not present

## 2016-06-24 DIAGNOSIS — R531 Weakness: Secondary | ICD-10-CM | POA: Diagnosis not present

## 2016-06-24 DIAGNOSIS — I890 Lymphedema, not elsewhere classified: Secondary | ICD-10-CM | POA: Diagnosis not present

## 2016-06-24 DIAGNOSIS — N2581 Secondary hyperparathyroidism of renal origin: Secondary | ICD-10-CM | POA: Diagnosis not present

## 2016-06-24 DIAGNOSIS — N186 End stage renal disease: Secondary | ICD-10-CM | POA: Diagnosis not present

## 2016-06-24 DIAGNOSIS — I5032 Chronic diastolic (congestive) heart failure: Secondary | ICD-10-CM | POA: Diagnosis not present

## 2016-06-24 DIAGNOSIS — D631 Anemia in chronic kidney disease: Secondary | ICD-10-CM | POA: Diagnosis not present

## 2016-06-24 DIAGNOSIS — I132 Hypertensive heart and chronic kidney disease with heart failure and with stage 5 chronic kidney disease, or end stage renal disease: Secondary | ICD-10-CM | POA: Diagnosis not present

## 2016-06-24 DIAGNOSIS — Z992 Dependence on renal dialysis: Secondary | ICD-10-CM | POA: Diagnosis not present

## 2016-06-24 DIAGNOSIS — E1122 Type 2 diabetes mellitus with diabetic chronic kidney disease: Secondary | ICD-10-CM | POA: Diagnosis not present

## 2016-06-24 DIAGNOSIS — D509 Iron deficiency anemia, unspecified: Secondary | ICD-10-CM | POA: Diagnosis not present

## 2016-06-24 DIAGNOSIS — R7881 Bacteremia: Secondary | ICD-10-CM | POA: Diagnosis not present

## 2016-06-25 DIAGNOSIS — R7881 Bacteremia: Secondary | ICD-10-CM | POA: Diagnosis not present

## 2016-06-25 DIAGNOSIS — Z992 Dependence on renal dialysis: Secondary | ICD-10-CM | POA: Diagnosis not present

## 2016-06-25 DIAGNOSIS — N2581 Secondary hyperparathyroidism of renal origin: Secondary | ICD-10-CM | POA: Diagnosis not present

## 2016-06-25 DIAGNOSIS — N186 End stage renal disease: Secondary | ICD-10-CM | POA: Diagnosis not present

## 2016-06-25 DIAGNOSIS — D631 Anemia in chronic kidney disease: Secondary | ICD-10-CM | POA: Diagnosis not present

## 2016-06-25 DIAGNOSIS — D509 Iron deficiency anemia, unspecified: Secondary | ICD-10-CM | POA: Diagnosis not present

## 2016-06-28 ENCOUNTER — Other Ambulatory Visit: Payer: Self-pay

## 2016-06-28 DIAGNOSIS — E1122 Type 2 diabetes mellitus with diabetic chronic kidney disease: Secondary | ICD-10-CM | POA: Diagnosis not present

## 2016-06-28 DIAGNOSIS — R531 Weakness: Secondary | ICD-10-CM | POA: Diagnosis not present

## 2016-06-28 DIAGNOSIS — I5032 Chronic diastolic (congestive) heart failure: Secondary | ICD-10-CM | POA: Diagnosis not present

## 2016-06-28 DIAGNOSIS — Z91041 Radiographic dye allergy status: Secondary | ICD-10-CM

## 2016-06-28 DIAGNOSIS — I890 Lymphedema, not elsewhere classified: Secondary | ICD-10-CM | POA: Diagnosis not present

## 2016-06-28 DIAGNOSIS — I132 Hypertensive heart and chronic kidney disease with heart failure and with stage 5 chronic kidney disease, or end stage renal disease: Secondary | ICD-10-CM | POA: Diagnosis not present

## 2016-06-28 DIAGNOSIS — N186 End stage renal disease: Secondary | ICD-10-CM | POA: Diagnosis not present

## 2016-06-28 MED ORDER — PREDNISONE 50 MG PO TABS: ORAL_TABLET | ORAL | Status: DC

## 2016-06-28 NOTE — Progress Notes (Signed)
Patient ID: [name], female   DOB: 01-11-1966, 50 y.o.   MRN: [id number] Rec'd request for Fistulogram left arm AVF, from [facility].  Reported clots are being pulled from the Fistula during treatment.  Fistulogram scheduled for 07/02/16 @ 10:30 AM.  Discussed pre-procedure instructions with pt.  Advised that the 13 hr. Prednisone/ Benadryl Prep will be ordered, due to Contrast allergy, and was given verbal instructions, as to when to take the doses  pre-procedure.  Pt. Verb. Understanding.

## 2016-06-29 DIAGNOSIS — D509 Iron deficiency anemia, unspecified: Secondary | ICD-10-CM | POA: Diagnosis not present

## 2016-06-29 DIAGNOSIS — I890 Lymphedema, not elsewhere classified: Secondary | ICD-10-CM | POA: Diagnosis not present

## 2016-06-29 DIAGNOSIS — M797 Fibromyalgia: Secondary | ICD-10-CM | POA: Diagnosis not present

## 2016-06-29 DIAGNOSIS — I5032 Chronic diastolic (congestive) heart failure: Secondary | ICD-10-CM | POA: Diagnosis not present

## 2016-06-29 DIAGNOSIS — R7881 Bacteremia: Secondary | ICD-10-CM | POA: Diagnosis not present

## 2016-06-29 DIAGNOSIS — I132 Hypertensive heart and chronic kidney disease with heart failure and with stage 5 chronic kidney disease, or end stage renal disease: Secondary | ICD-10-CM | POA: Diagnosis not present

## 2016-06-29 DIAGNOSIS — J449 Chronic obstructive pulmonary disease, unspecified: Secondary | ICD-10-CM | POA: Diagnosis not present

## 2016-06-29 DIAGNOSIS — E1122 Type 2 diabetes mellitus with diabetic chronic kidney disease: Secondary | ICD-10-CM | POA: Diagnosis not present

## 2016-06-29 DIAGNOSIS — R531 Weakness: Secondary | ICD-10-CM | POA: Diagnosis not present

## 2016-06-29 DIAGNOSIS — E662 Morbid (severe) obesity with alveolar hypoventilation: Secondary | ICD-10-CM | POA: Diagnosis not present

## 2016-06-29 DIAGNOSIS — N186 End stage renal disease: Secondary | ICD-10-CM | POA: Diagnosis not present

## 2016-06-29 DIAGNOSIS — Z992 Dependence on renal dialysis: Secondary | ICD-10-CM | POA: Diagnosis not present

## 2016-06-29 DIAGNOSIS — D631 Anemia in chronic kidney disease: Secondary | ICD-10-CM | POA: Diagnosis not present

## 2016-06-29 DIAGNOSIS — I2510 Atherosclerotic heart disease of native coronary artery without angina pectoris: Secondary | ICD-10-CM | POA: Diagnosis not present

## 2016-06-29 DIAGNOSIS — N2581 Secondary hyperparathyroidism of renal origin: Secondary | ICD-10-CM | POA: Diagnosis not present

## 2016-06-30 DIAGNOSIS — E1122 Type 2 diabetes mellitus with diabetic chronic kidney disease: Secondary | ICD-10-CM | POA: Diagnosis not present

## 2016-06-30 DIAGNOSIS — I890 Lymphedema, not elsewhere classified: Secondary | ICD-10-CM | POA: Diagnosis not present

## 2016-06-30 DIAGNOSIS — I5032 Chronic diastolic (congestive) heart failure: Secondary | ICD-10-CM | POA: Diagnosis not present

## 2016-06-30 DIAGNOSIS — I132 Hypertensive heart and chronic kidney disease with heart failure and with stage 5 chronic kidney disease, or end stage renal disease: Secondary | ICD-10-CM | POA: Diagnosis not present

## 2016-06-30 DIAGNOSIS — N186 End stage renal disease: Secondary | ICD-10-CM | POA: Diagnosis not present

## 2016-06-30 DIAGNOSIS — R531 Weakness: Secondary | ICD-10-CM | POA: Diagnosis not present

## 2016-07-01 DIAGNOSIS — Z992 Dependence on renal dialysis: Secondary | ICD-10-CM | POA: Diagnosis not present

## 2016-07-01 DIAGNOSIS — D509 Iron deficiency anemia, unspecified: Secondary | ICD-10-CM | POA: Diagnosis not present

## 2016-07-01 DIAGNOSIS — D631 Anemia in chronic kidney disease: Secondary | ICD-10-CM | POA: Diagnosis not present

## 2016-07-01 DIAGNOSIS — N186 End stage renal disease: Secondary | ICD-10-CM | POA: Diagnosis not present

## 2016-07-01 DIAGNOSIS — N2581 Secondary hyperparathyroidism of renal origin: Secondary | ICD-10-CM | POA: Diagnosis not present

## 2016-07-01 DIAGNOSIS — R7881 Bacteremia: Secondary | ICD-10-CM | POA: Diagnosis not present

## 2016-07-02 ENCOUNTER — Ambulatory Visit (HOSPITAL_COMMUNITY)
Admission: RE | Admit: 2016-07-02 | Discharge: 2016-07-02 | Disposition: A | Discharge status: Home / Self Care | Payer: Medicare Other | Source: Ambulatory Visit | Attending: Vascular Surgery | Admitting: Vascular Surgery

## 2016-07-02 ENCOUNTER — Ambulatory Visit (INDEPENDENT_AMBULATORY_CARE_PROVIDER_SITE_OTHER)
Admit: 2016-07-02 | Discharge: 2016-07-02 | Disposition: A | Discharge status: Home / Self Care | Payer: Medicare Other | Attending: Vascular Surgery | Admitting: Vascular Surgery

## 2016-07-02 ENCOUNTER — Other Ambulatory Visit: Payer: Self-pay | Admitting: Vascular Surgery

## 2016-07-02 ENCOUNTER — Encounter (HOSPITAL_COMMUNITY)
Admission: RE | Disposition: A | Discharge status: Home / Self Care | Payer: Self-pay | Source: Ambulatory Visit | Attending: Vascular Surgery

## 2016-07-02 ENCOUNTER — Other Ambulatory Visit (HOSPITAL_COMMUNITY): Payer: Self-pay | Admitting: Vascular Surgery

## 2016-07-02 DIAGNOSIS — T82590A Other mechanical complication of surgically created arteriovenous fistula, initial encounter: Secondary | ICD-10-CM

## 2016-07-02 DIAGNOSIS — Z95828 Presence of other vascular implants and grafts: Secondary | ICD-10-CM | POA: Insufficient documentation

## 2016-07-02 DIAGNOSIS — L988 Other specified disorders of the skin and subcutaneous tissue: Secondary | ICD-10-CM | POA: Insufficient documentation

## 2016-07-02 SURGERY — INVASIVE LAB ABORTED CASE

## 2016-07-02 MED ORDER — SODIUM CHLORIDE 0.9% FLUSH: 3.0000 mL | INTRAVENOUS | Status: DC | PRN

## 2016-07-02 SURGICAL SUPPLY — 9 items
BAG SNAP BAND KOVER 36X36 (MISCELLANEOUS) IMPLANT
COVER DOME SNAP 22 D (MISCELLANEOUS) IMPLANT
COVER PRB 48X5XTLSCP FOLD TPE (BAG) IMPLANT
COVER PROBE 5X48 (BAG)
PROTECTION STATION PRESSURIZED (MISCELLANEOUS)
STATION PROTECTION PRESSURIZED (MISCELLANEOUS) IMPLANT
STOPCOCK MORSE 400PSI 3WAY (MISCELLANEOUS) IMPLANT
TRAY PV CATH (CUSTOM PROCEDURE TRAY) IMPLANT
TUBING CIL FLEX 10 FLL-RA (TUBING) IMPLANT

## 2016-07-02 NOTE — Progress Notes (Signed)
Pt with history of anaphylaxis with contrast.  Numbness in mouth and tongue swelling about 10 years ago.  She has a patent fistula with easily palpable thrill today.  She has a working catheter.  I believe safest thing at this point would be to duplex the fistula first to see if any obvious narrowing then consider operative revision.  Duplex arranged at our office today.  Ruta Hinds, MD [facility] Office: [phone] Pager: [phone]

## 2016-07-03 DIAGNOSIS — N2581 Secondary hyperparathyroidism of renal origin: Secondary | ICD-10-CM | POA: Diagnosis not present

## 2016-07-03 DIAGNOSIS — R7881 Bacteremia: Secondary | ICD-10-CM | POA: Diagnosis not present

## 2016-07-03 DIAGNOSIS — D631 Anemia in chronic kidney disease: Secondary | ICD-10-CM | POA: Diagnosis not present

## 2016-07-03 DIAGNOSIS — N186 End stage renal disease: Secondary | ICD-10-CM | POA: Diagnosis not present

## 2016-07-03 DIAGNOSIS — Z992 Dependence on renal dialysis: Secondary | ICD-10-CM | POA: Diagnosis not present

## 2016-07-03 DIAGNOSIS — D509 Iron deficiency anemia, unspecified: Secondary | ICD-10-CM | POA: Diagnosis not present

## 2016-07-05 DIAGNOSIS — I132 Hypertensive heart and chronic kidney disease with heart failure and with stage 5 chronic kidney disease, or end stage renal disease: Secondary | ICD-10-CM | POA: Diagnosis not present

## 2016-07-05 DIAGNOSIS — R531 Weakness: Secondary | ICD-10-CM | POA: Diagnosis not present

## 2016-07-05 DIAGNOSIS — I5032 Chronic diastolic (congestive) heart failure: Secondary | ICD-10-CM | POA: Diagnosis not present

## 2016-07-05 DIAGNOSIS — E1122 Type 2 diabetes mellitus with diabetic chronic kidney disease: Secondary | ICD-10-CM | POA: Diagnosis not present

## 2016-07-05 DIAGNOSIS — N186 End stage renal disease: Secondary | ICD-10-CM | POA: Diagnosis not present

## 2016-07-05 DIAGNOSIS — I890 Lymphedema, not elsewhere classified: Secondary | ICD-10-CM | POA: Diagnosis not present

## 2016-07-06 DIAGNOSIS — N2581 Secondary hyperparathyroidism of renal origin: Secondary | ICD-10-CM | POA: Diagnosis not present

## 2016-07-06 DIAGNOSIS — N186 End stage renal disease: Secondary | ICD-10-CM | POA: Diagnosis not present

## 2016-07-06 DIAGNOSIS — R7881 Bacteremia: Secondary | ICD-10-CM | POA: Diagnosis not present

## 2016-07-06 DIAGNOSIS — D509 Iron deficiency anemia, unspecified: Secondary | ICD-10-CM | POA: Diagnosis not present

## 2016-07-06 DIAGNOSIS — D631 Anemia in chronic kidney disease: Secondary | ICD-10-CM | POA: Diagnosis not present

## 2016-07-06 DIAGNOSIS — Z992 Dependence on renal dialysis: Secondary | ICD-10-CM | POA: Diagnosis not present

## 2016-07-07 DIAGNOSIS — I890 Lymphedema, not elsewhere classified: Secondary | ICD-10-CM | POA: Diagnosis not present

## 2016-07-07 DIAGNOSIS — R531 Weakness: Secondary | ICD-10-CM | POA: Diagnosis not present

## 2016-07-07 DIAGNOSIS — I132 Hypertensive heart and chronic kidney disease with heart failure and with stage 5 chronic kidney disease, or end stage renal disease: Secondary | ICD-10-CM | POA: Diagnosis not present

## 2016-07-07 DIAGNOSIS — N186 End stage renal disease: Secondary | ICD-10-CM | POA: Diagnosis not present

## 2016-07-07 DIAGNOSIS — E1122 Type 2 diabetes mellitus with diabetic chronic kidney disease: Secondary | ICD-10-CM | POA: Diagnosis not present

## 2016-07-07 DIAGNOSIS — I5032 Chronic diastolic (congestive) heart failure: Secondary | ICD-10-CM | POA: Diagnosis not present

## 2016-07-08 DIAGNOSIS — N2581 Secondary hyperparathyroidism of renal origin: Secondary | ICD-10-CM | POA: Diagnosis not present

## 2016-07-08 DIAGNOSIS — N186 End stage renal disease: Secondary | ICD-10-CM | POA: Diagnosis not present

## 2016-07-08 DIAGNOSIS — D631 Anemia in chronic kidney disease: Secondary | ICD-10-CM | POA: Diagnosis not present

## 2016-07-08 DIAGNOSIS — Z992 Dependence on renal dialysis: Secondary | ICD-10-CM | POA: Diagnosis not present

## 2016-07-08 DIAGNOSIS — D509 Iron deficiency anemia, unspecified: Secondary | ICD-10-CM | POA: Diagnosis not present

## 2016-07-08 DIAGNOSIS — R7881 Bacteremia: Secondary | ICD-10-CM | POA: Diagnosis not present

## 2016-07-10 DIAGNOSIS — R7881 Bacteremia: Secondary | ICD-10-CM | POA: Diagnosis not present

## 2016-07-10 DIAGNOSIS — N2581 Secondary hyperparathyroidism of renal origin: Secondary | ICD-10-CM | POA: Diagnosis not present

## 2016-07-10 DIAGNOSIS — N186 End stage renal disease: Secondary | ICD-10-CM | POA: Diagnosis not present

## 2016-07-10 DIAGNOSIS — D509 Iron deficiency anemia, unspecified: Secondary | ICD-10-CM | POA: Diagnosis not present

## 2016-07-10 DIAGNOSIS — Z992 Dependence on renal dialysis: Secondary | ICD-10-CM | POA: Diagnosis not present

## 2016-07-10 DIAGNOSIS — D631 Anemia in chronic kidney disease: Secondary | ICD-10-CM | POA: Diagnosis not present

## 2016-07-13 DIAGNOSIS — D631 Anemia in chronic kidney disease: Secondary | ICD-10-CM | POA: Diagnosis not present

## 2016-07-13 DIAGNOSIS — N2581 Secondary hyperparathyroidism of renal origin: Secondary | ICD-10-CM | POA: Diagnosis not present

## 2016-07-13 DIAGNOSIS — R7881 Bacteremia: Secondary | ICD-10-CM | POA: Diagnosis not present

## 2016-07-13 DIAGNOSIS — N186 End stage renal disease: Secondary | ICD-10-CM | POA: Diagnosis not present

## 2016-07-13 DIAGNOSIS — Z992 Dependence on renal dialysis: Secondary | ICD-10-CM | POA: Diagnosis not present

## 2016-07-13 DIAGNOSIS — D509 Iron deficiency anemia, unspecified: Secondary | ICD-10-CM | POA: Diagnosis not present

## 2016-07-14 DIAGNOSIS — E1122 Type 2 diabetes mellitus with diabetic chronic kidney disease: Secondary | ICD-10-CM | POA: Diagnosis not present

## 2016-07-14 DIAGNOSIS — N186 End stage renal disease: Secondary | ICD-10-CM | POA: Diagnosis not present

## 2016-07-14 DIAGNOSIS — R531 Weakness: Secondary | ICD-10-CM | POA: Diagnosis not present

## 2016-07-14 DIAGNOSIS — I5032 Chronic diastolic (congestive) heart failure: Secondary | ICD-10-CM | POA: Diagnosis not present

## 2016-07-14 DIAGNOSIS — I890 Lymphedema, not elsewhere classified: Secondary | ICD-10-CM | POA: Diagnosis not present

## 2016-07-14 DIAGNOSIS — I132 Hypertensive heart and chronic kidney disease with heart failure and with stage 5 chronic kidney disease, or end stage renal disease: Secondary | ICD-10-CM | POA: Diagnosis not present

## 2016-07-15 DIAGNOSIS — Z992 Dependence on renal dialysis: Secondary | ICD-10-CM | POA: Diagnosis not present

## 2016-07-15 DIAGNOSIS — R7881 Bacteremia: Secondary | ICD-10-CM | POA: Diagnosis not present

## 2016-07-15 DIAGNOSIS — N2581 Secondary hyperparathyroidism of renal origin: Secondary | ICD-10-CM | POA: Diagnosis not present

## 2016-07-15 DIAGNOSIS — N186 End stage renal disease: Secondary | ICD-10-CM | POA: Diagnosis not present

## 2016-07-15 DIAGNOSIS — D631 Anemia in chronic kidney disease: Secondary | ICD-10-CM | POA: Diagnosis not present

## 2016-07-15 DIAGNOSIS — D509 Iron deficiency anemia, unspecified: Secondary | ICD-10-CM | POA: Diagnosis not present

## 2016-07-16 DIAGNOSIS — I132 Hypertensive heart and chronic kidney disease with heart failure and with stage 5 chronic kidney disease, or end stage renal disease: Secondary | ICD-10-CM | POA: Diagnosis not present

## 2016-07-16 DIAGNOSIS — I5032 Chronic diastolic (congestive) heart failure: Secondary | ICD-10-CM | POA: Diagnosis not present

## 2016-07-16 DIAGNOSIS — Z452 Encounter for adjustment and management of vascular access device: Secondary | ICD-10-CM | POA: Diagnosis not present

## 2016-07-16 DIAGNOSIS — I890 Lymphedema, not elsewhere classified: Secondary | ICD-10-CM | POA: Diagnosis not present

## 2016-07-16 DIAGNOSIS — R531 Weakness: Secondary | ICD-10-CM | POA: Diagnosis not present

## 2016-07-16 DIAGNOSIS — E1122 Type 2 diabetes mellitus with diabetic chronic kidney disease: Secondary | ICD-10-CM | POA: Diagnosis not present

## 2016-07-16 DIAGNOSIS — N186 End stage renal disease: Secondary | ICD-10-CM | POA: Diagnosis not present

## 2016-07-17 DIAGNOSIS — D631 Anemia in chronic kidney disease: Secondary | ICD-10-CM | POA: Diagnosis not present

## 2016-07-17 DIAGNOSIS — R7881 Bacteremia: Secondary | ICD-10-CM | POA: Diagnosis not present

## 2016-07-17 DIAGNOSIS — N2581 Secondary hyperparathyroidism of renal origin: Secondary | ICD-10-CM | POA: Diagnosis not present

## 2016-07-17 DIAGNOSIS — Z992 Dependence on renal dialysis: Secondary | ICD-10-CM | POA: Diagnosis not present

## 2016-07-17 DIAGNOSIS — D509 Iron deficiency anemia, unspecified: Secondary | ICD-10-CM | POA: Diagnosis not present

## 2016-07-17 DIAGNOSIS — N186 End stage renal disease: Secondary | ICD-10-CM | POA: Diagnosis not present

## 2016-07-19 DIAGNOSIS — Z992 Dependence on renal dialysis: Secondary | ICD-10-CM | POA: Diagnosis not present

## 2016-07-19 DIAGNOSIS — N186 End stage renal disease: Secondary | ICD-10-CM | POA: Diagnosis not present

## 2016-07-20 DIAGNOSIS — R110 Nausea: Secondary | ICD-10-CM | POA: Diagnosis not present

## 2016-07-20 DIAGNOSIS — D509 Iron deficiency anemia, unspecified: Secondary | ICD-10-CM | POA: Diagnosis not present

## 2016-07-20 DIAGNOSIS — R1110 Vomiting, unspecified: Secondary | ICD-10-CM | POA: Diagnosis not present

## 2016-07-20 DIAGNOSIS — D631 Anemia in chronic kidney disease: Secondary | ICD-10-CM | POA: Diagnosis not present

## 2016-07-20 DIAGNOSIS — B9561 Methicillin susceptible Staphylococcus aureus infection as the cause of diseases classified elsewhere: Secondary | ICD-10-CM | POA: Diagnosis not present

## 2016-07-20 DIAGNOSIS — N186 End stage renal disease: Secondary | ICD-10-CM | POA: Diagnosis not present

## 2016-07-20 DIAGNOSIS — Z992 Dependence on renal dialysis: Secondary | ICD-10-CM | POA: Diagnosis not present

## 2016-07-20 DIAGNOSIS — Z23 Encounter for immunization: Secondary | ICD-10-CM | POA: Diagnosis not present

## 2016-07-20 DIAGNOSIS — N2581 Secondary hyperparathyroidism of renal origin: Secondary | ICD-10-CM | POA: Diagnosis not present

## 2016-07-20 DIAGNOSIS — R7881 Bacteremia: Secondary | ICD-10-CM | POA: Diagnosis not present

## 2016-07-21 DIAGNOSIS — I5032 Chronic diastolic (congestive) heart failure: Secondary | ICD-10-CM | POA: Diagnosis not present

## 2016-07-21 DIAGNOSIS — I890 Lymphedema, not elsewhere classified: Secondary | ICD-10-CM | POA: Diagnosis not present

## 2016-07-21 DIAGNOSIS — R531 Weakness: Secondary | ICD-10-CM | POA: Diagnosis not present

## 2016-07-21 DIAGNOSIS — N186 End stage renal disease: Secondary | ICD-10-CM | POA: Diagnosis not present

## 2016-07-21 DIAGNOSIS — I132 Hypertensive heart and chronic kidney disease with heart failure and with stage 5 chronic kidney disease, or end stage renal disease: Secondary | ICD-10-CM | POA: Diagnosis not present

## 2016-07-21 DIAGNOSIS — E1122 Type 2 diabetes mellitus with diabetic chronic kidney disease: Secondary | ICD-10-CM | POA: Diagnosis not present

## 2016-07-24 DIAGNOSIS — D509 Iron deficiency anemia, unspecified: Secondary | ICD-10-CM | POA: Diagnosis not present

## 2016-07-24 DIAGNOSIS — D631 Anemia in chronic kidney disease: Secondary | ICD-10-CM | POA: Diagnosis not present

## 2016-07-24 DIAGNOSIS — N2581 Secondary hyperparathyroidism of renal origin: Secondary | ICD-10-CM | POA: Diagnosis not present

## 2016-07-24 DIAGNOSIS — R7881 Bacteremia: Secondary | ICD-10-CM | POA: Diagnosis not present

## 2016-07-24 DIAGNOSIS — N186 End stage renal disease: Secondary | ICD-10-CM | POA: Diagnosis not present

## 2016-07-24 DIAGNOSIS — R110 Nausea: Secondary | ICD-10-CM | POA: Diagnosis not present

## 2016-07-27 DIAGNOSIS — N186 End stage renal disease: Secondary | ICD-10-CM | POA: Diagnosis not present

## 2016-07-27 DIAGNOSIS — D509 Iron deficiency anemia, unspecified: Secondary | ICD-10-CM | POA: Diagnosis not present

## 2016-07-27 DIAGNOSIS — N2581 Secondary hyperparathyroidism of renal origin: Secondary | ICD-10-CM | POA: Diagnosis not present

## 2016-07-27 DIAGNOSIS — R7881 Bacteremia: Secondary | ICD-10-CM | POA: Diagnosis not present

## 2016-07-27 DIAGNOSIS — D631 Anemia in chronic kidney disease: Secondary | ICD-10-CM | POA: Diagnosis not present

## 2016-07-27 DIAGNOSIS — R110 Nausea: Secondary | ICD-10-CM | POA: Diagnosis not present

## 2016-07-28 DIAGNOSIS — R531 Weakness: Secondary | ICD-10-CM | POA: Diagnosis not present

## 2016-07-28 DIAGNOSIS — I890 Lymphedema, not elsewhere classified: Secondary | ICD-10-CM | POA: Diagnosis not present

## 2016-07-28 DIAGNOSIS — E1122 Type 2 diabetes mellitus with diabetic chronic kidney disease: Secondary | ICD-10-CM | POA: Diagnosis not present

## 2016-07-28 DIAGNOSIS — N186 End stage renal disease: Secondary | ICD-10-CM | POA: Diagnosis not present

## 2016-07-28 DIAGNOSIS — I132 Hypertensive heart and chronic kidney disease with heart failure and with stage 5 chronic kidney disease, or end stage renal disease: Secondary | ICD-10-CM | POA: Diagnosis not present

## 2016-07-28 DIAGNOSIS — I5032 Chronic diastolic (congestive) heart failure: Secondary | ICD-10-CM | POA: Diagnosis not present

## 2016-07-29 DIAGNOSIS — R7881 Bacteremia: Secondary | ICD-10-CM | POA: Diagnosis not present

## 2016-07-29 DIAGNOSIS — R110 Nausea: Secondary | ICD-10-CM | POA: Diagnosis not present

## 2016-07-29 DIAGNOSIS — N2581 Secondary hyperparathyroidism of renal origin: Secondary | ICD-10-CM | POA: Diagnosis not present

## 2016-07-29 DIAGNOSIS — D509 Iron deficiency anemia, unspecified: Secondary | ICD-10-CM | POA: Diagnosis not present

## 2016-07-29 DIAGNOSIS — D631 Anemia in chronic kidney disease: Secondary | ICD-10-CM | POA: Diagnosis not present

## 2016-07-29 DIAGNOSIS — N186 End stage renal disease: Secondary | ICD-10-CM | POA: Diagnosis not present

## 2016-07-30 DIAGNOSIS — N2581 Secondary hyperparathyroidism of renal origin: Secondary | ICD-10-CM | POA: Diagnosis not present

## 2016-07-30 DIAGNOSIS — R7881 Bacteremia: Secondary | ICD-10-CM | POA: Diagnosis not present

## 2016-07-30 DIAGNOSIS — N186 End stage renal disease: Secondary | ICD-10-CM | POA: Diagnosis not present

## 2016-07-30 DIAGNOSIS — R110 Nausea: Secondary | ICD-10-CM | POA: Diagnosis not present

## 2016-07-30 DIAGNOSIS — D509 Iron deficiency anemia, unspecified: Secondary | ICD-10-CM | POA: Diagnosis not present

## 2016-07-30 DIAGNOSIS — D631 Anemia in chronic kidney disease: Secondary | ICD-10-CM | POA: Diagnosis not present

## 2016-08-03 DIAGNOSIS — Z992 Dependence on renal dialysis: Secondary | ICD-10-CM | POA: Diagnosis not present

## 2016-08-03 DIAGNOSIS — R110 Nausea: Secondary | ICD-10-CM | POA: Diagnosis not present

## 2016-08-03 DIAGNOSIS — D631 Anemia in chronic kidney disease: Secondary | ICD-10-CM | POA: Diagnosis not present

## 2016-08-03 DIAGNOSIS — D509 Iron deficiency anemia, unspecified: Secondary | ICD-10-CM | POA: Diagnosis not present

## 2016-08-03 DIAGNOSIS — R7881 Bacteremia: Secondary | ICD-10-CM | POA: Diagnosis not present

## 2016-08-03 DIAGNOSIS — N186 End stage renal disease: Secondary | ICD-10-CM | POA: Diagnosis not present

## 2016-08-03 DIAGNOSIS — N2581 Secondary hyperparathyroidism of renal origin: Secondary | ICD-10-CM | POA: Diagnosis not present

## 2016-08-04 DIAGNOSIS — E1122 Type 2 diabetes mellitus with diabetic chronic kidney disease: Secondary | ICD-10-CM | POA: Diagnosis not present

## 2016-08-04 DIAGNOSIS — R531 Weakness: Secondary | ICD-10-CM | POA: Diagnosis not present

## 2016-08-04 DIAGNOSIS — I132 Hypertensive heart and chronic kidney disease with heart failure and with stage 5 chronic kidney disease, or end stage renal disease: Secondary | ICD-10-CM | POA: Diagnosis not present

## 2016-08-04 DIAGNOSIS — N186 End stage renal disease: Secondary | ICD-10-CM | POA: Diagnosis not present

## 2016-08-04 DIAGNOSIS — I5032 Chronic diastolic (congestive) heart failure: Secondary | ICD-10-CM | POA: Diagnosis not present

## 2016-08-04 DIAGNOSIS — I890 Lymphedema, not elsewhere classified: Secondary | ICD-10-CM | POA: Diagnosis not present

## 2016-08-05 DIAGNOSIS — N2581 Secondary hyperparathyroidism of renal origin: Secondary | ICD-10-CM | POA: Diagnosis not present

## 2016-08-05 DIAGNOSIS — D509 Iron deficiency anemia, unspecified: Secondary | ICD-10-CM | POA: Diagnosis not present

## 2016-08-05 DIAGNOSIS — R110 Nausea: Secondary | ICD-10-CM | POA: Diagnosis not present

## 2016-08-05 DIAGNOSIS — N186 End stage renal disease: Secondary | ICD-10-CM | POA: Diagnosis not present

## 2016-08-05 DIAGNOSIS — D631 Anemia in chronic kidney disease: Secondary | ICD-10-CM | POA: Diagnosis not present

## 2016-08-05 DIAGNOSIS — R7881 Bacteremia: Secondary | ICD-10-CM | POA: Diagnosis not present

## 2016-08-06 DIAGNOSIS — N186 End stage renal disease: Secondary | ICD-10-CM | POA: Diagnosis not present

## 2016-08-06 DIAGNOSIS — I132 Hypertensive heart and chronic kidney disease with heart failure and with stage 5 chronic kidney disease, or end stage renal disease: Secondary | ICD-10-CM | POA: Diagnosis not present

## 2016-08-06 DIAGNOSIS — R531 Weakness: Secondary | ICD-10-CM | POA: Diagnosis not present

## 2016-08-06 DIAGNOSIS — I5032 Chronic diastolic (congestive) heart failure: Secondary | ICD-10-CM | POA: Diagnosis not present

## 2016-08-06 DIAGNOSIS — I890 Lymphedema, not elsewhere classified: Secondary | ICD-10-CM | POA: Diagnosis not present

## 2016-08-06 DIAGNOSIS — E1122 Type 2 diabetes mellitus with diabetic chronic kidney disease: Secondary | ICD-10-CM | POA: Diagnosis not present

## 2016-08-07 DIAGNOSIS — R110 Nausea: Secondary | ICD-10-CM | POA: Diagnosis not present

## 2016-08-07 DIAGNOSIS — D509 Iron deficiency anemia, unspecified: Secondary | ICD-10-CM | POA: Diagnosis not present

## 2016-08-07 DIAGNOSIS — R7881 Bacteremia: Secondary | ICD-10-CM | POA: Diagnosis not present

## 2016-08-07 DIAGNOSIS — D631 Anemia in chronic kidney disease: Secondary | ICD-10-CM | POA: Diagnosis not present

## 2016-08-07 DIAGNOSIS — N2581 Secondary hyperparathyroidism of renal origin: Secondary | ICD-10-CM | POA: Diagnosis not present

## 2016-08-07 DIAGNOSIS — N186 End stage renal disease: Secondary | ICD-10-CM | POA: Diagnosis not present

## 2016-08-10 DIAGNOSIS — D509 Iron deficiency anemia, unspecified: Secondary | ICD-10-CM | POA: Diagnosis not present

## 2016-08-10 DIAGNOSIS — N186 End stage renal disease: Secondary | ICD-10-CM | POA: Diagnosis not present

## 2016-08-10 DIAGNOSIS — N2581 Secondary hyperparathyroidism of renal origin: Secondary | ICD-10-CM | POA: Diagnosis not present

## 2016-08-10 DIAGNOSIS — D631 Anemia in chronic kidney disease: Secondary | ICD-10-CM | POA: Diagnosis not present

## 2016-08-10 DIAGNOSIS — R7881 Bacteremia: Secondary | ICD-10-CM | POA: Diagnosis not present

## 2016-08-10 DIAGNOSIS — R110 Nausea: Secondary | ICD-10-CM | POA: Diagnosis not present

## 2016-08-11 ENCOUNTER — Inpatient Hospital Stay (HOSPITAL_COMMUNITY)
Admission: EM | Admit: 2016-08-11 | Discharge: 2016-08-15 | DRG: 871 | Disposition: A | Discharge status: Home Health | Payer: Medicare Other | Attending: Internal Medicine | Admitting: Internal Medicine

## 2016-08-11 ENCOUNTER — Encounter (HOSPITAL_COMMUNITY): Payer: Self-pay | Admitting: Emergency Medicine

## 2016-08-11 ENCOUNTER — Observation Stay (HOSPITAL_COMMUNITY): Payer: Medicare Other

## 2016-08-11 ENCOUNTER — Emergency Department (HOSPITAL_COMMUNITY): Payer: Medicare Other

## 2016-08-11 ENCOUNTER — Inpatient Hospital Stay (HOSPITAL_COMMUNITY): Payer: Medicare Other

## 2016-08-11 DIAGNOSIS — J9611 Chronic respiratory failure with hypoxia: Secondary | ICD-10-CM | POA: Diagnosis present

## 2016-08-11 DIAGNOSIS — Z91013 Allergy to seafood: Secondary | ICD-10-CM

## 2016-08-11 DIAGNOSIS — Z992 Dependence on renal dialysis: Secondary | ICD-10-CM

## 2016-08-11 DIAGNOSIS — E662 Morbid (severe) obesity with alveolar hypoventilation: Secondary | ICD-10-CM | POA: Diagnosis present

## 2016-08-11 DIAGNOSIS — N186 End stage renal disease: Secondary | ICD-10-CM | POA: Diagnosis present

## 2016-08-11 DIAGNOSIS — G4733 Obstructive sleep apnea (adult) (pediatric): Secondary | ICD-10-CM | POA: Diagnosis present

## 2016-08-11 DIAGNOSIS — Z8249 Family history of ischemic heart disease and other diseases of the circulatory system: Secondary | ICD-10-CM

## 2016-08-11 DIAGNOSIS — G8929 Other chronic pain: Secondary | ICD-10-CM | POA: Diagnosis present

## 2016-08-11 DIAGNOSIS — N2581 Secondary hyperparathyroidism of renal origin: Secondary | ICD-10-CM | POA: Diagnosis present

## 2016-08-11 DIAGNOSIS — E119 Type 2 diabetes mellitus without complications: Secondary | ICD-10-CM

## 2016-08-11 DIAGNOSIS — A419 Sepsis, unspecified organism: Secondary | ICD-10-CM | POA: Diagnosis present

## 2016-08-11 DIAGNOSIS — I890 Lymphedema, not elsewhere classified: Secondary | ICD-10-CM

## 2016-08-11 DIAGNOSIS — Z887 Allergy status to serum and vaccine status: Secondary | ICD-10-CM

## 2016-08-11 DIAGNOSIS — E785 Hyperlipidemia, unspecified: Secondary | ICD-10-CM | POA: Diagnosis present

## 2016-08-11 DIAGNOSIS — L03119 Cellulitis of unspecified part of limb: Secondary | ICD-10-CM | POA: Diagnosis present

## 2016-08-11 DIAGNOSIS — Z91041 Radiographic dye allergy status: Secondary | ICD-10-CM

## 2016-08-11 DIAGNOSIS — E1122 Type 2 diabetes mellitus with diabetic chronic kidney disease: Secondary | ICD-10-CM | POA: Diagnosis present

## 2016-08-11 DIAGNOSIS — Z881 Allergy status to other antibiotic agents status: Secondary | ICD-10-CM

## 2016-08-11 DIAGNOSIS — I120 Hypertensive chronic kidney disease with stage 5 chronic kidney disease or end stage renal disease: Secondary | ICD-10-CM | POA: Diagnosis not present

## 2016-08-11 DIAGNOSIS — D649 Anemia, unspecified: Secondary | ICD-10-CM | POA: Diagnosis present

## 2016-08-11 DIAGNOSIS — M797 Fibromyalgia: Secondary | ICD-10-CM | POA: Diagnosis present

## 2016-08-11 DIAGNOSIS — Z79899 Other long term (current) drug therapy: Secondary | ICD-10-CM | POA: Diagnosis not present

## 2016-08-11 DIAGNOSIS — R0989 Other specified symptoms and signs involving the circulatory and respiratory systems: Secondary | ICD-10-CM | POA: Diagnosis not present

## 2016-08-11 DIAGNOSIS — F419 Anxiety disorder, unspecified: Secondary | ICD-10-CM | POA: Diagnosis present

## 2016-08-11 DIAGNOSIS — Z6841 Body Mass Index (BMI) 40.0 and over, adult: Secondary | ICD-10-CM | POA: Diagnosis not present

## 2016-08-11 DIAGNOSIS — I132 Hypertensive heart and chronic kidney disease with heart failure and with stage 5 chronic kidney disease, or end stage renal disease: Secondary | ICD-10-CM | POA: Diagnosis present

## 2016-08-11 DIAGNOSIS — R7881 Bacteremia: Secondary | ICD-10-CM | POA: Diagnosis present

## 2016-08-11 DIAGNOSIS — Z9981 Dependence on supplemental oxygen: Secondary | ICD-10-CM

## 2016-08-11 DIAGNOSIS — R200 Anesthesia of skin: Secondary | ICD-10-CM | POA: Diagnosis present

## 2016-08-11 DIAGNOSIS — L03116 Cellulitis of left lower limb: Secondary | ICD-10-CM | POA: Diagnosis present

## 2016-08-11 DIAGNOSIS — I5032 Chronic diastolic (congestive) heart failure: Secondary | ICD-10-CM | POA: Diagnosis present

## 2016-08-11 DIAGNOSIS — I10 Essential (primary) hypertension: Secondary | ICD-10-CM | POA: Diagnosis present

## 2016-08-11 DIAGNOSIS — R202 Paresthesia of skin: Secondary | ICD-10-CM | POA: Diagnosis present

## 2016-08-11 DIAGNOSIS — I2510 Atherosclerotic heart disease of native coronary artery without angina pectoris: Secondary | ICD-10-CM | POA: Diagnosis present

## 2016-08-11 DIAGNOSIS — B957 Other staphylococcus as the cause of diseases classified elsewhere: Secondary | ICD-10-CM | POA: Diagnosis present

## 2016-08-11 DIAGNOSIS — E1129 Type 2 diabetes mellitus with other diabetic kidney complication: Secondary | ICD-10-CM | POA: Diagnosis not present

## 2016-08-11 DIAGNOSIS — K5792 Diverticulitis of intestine, part unspecified, without perforation or abscess without bleeding: Secondary | ICD-10-CM

## 2016-08-11 DIAGNOSIS — Z91018 Allergy to other foods: Secondary | ICD-10-CM

## 2016-08-11 LAB — CBC WITH DIFFERENTIAL/PLATELET
Basophils Absolute: 0 10*3/uL (ref 0.0–0.1)
Basophils Relative: 0 %
Eosinophils Absolute: 0.1 10*3/uL (ref 0.0–0.7)
Eosinophils Relative: 0 %
HEMATOCRIT: 38.4 % (ref 36.0–46.0)
HEMOGLOBIN: 12.8 g/dL (ref 12.0–15.0)
LYMPHS ABS: 1.1 10*3/uL (ref 0.7–4.0)
Lymphocytes Relative: 5 %
MCH: 32.2 pg (ref 26.0–34.0)
MCHC: 33.3 g/dL (ref 30.0–36.0)
MCV: 96.5 fL (ref 78.0–100.0)
MONOS PCT: 2 %
Monocytes Absolute: 0.6 10*3/uL (ref 0.1–1.0)
NEUTROS ABS: 21.1 10*3/uL — AB (ref 1.7–7.7)
NEUTROS PCT: 93 %
Platelets: 165 10*3/uL (ref 150–400)
RBC: 3.98 MIL/uL (ref 3.87–5.11)
RDW: 14.7 % (ref 11.5–15.5)
WBC: 22.8 10*3/uL — ABNORMAL HIGH (ref 4.0–10.5)

## 2016-08-11 LAB — URINALYSIS, ROUTINE W REFLEX MICROSCOPIC
Bilirubin Urine: NEGATIVE
Glucose, UA: NEGATIVE mg/dL
KETONES UR: NEGATIVE mg/dL
LEUKOCYTES UA: NEGATIVE
NITRITE: NEGATIVE
PH: 5.5 (ref 5.0–8.0)
Protein, ur: 100 mg/dL — AB
SPECIFIC GRAVITY, URINE: 1.02 (ref 1.005–1.030)

## 2016-08-11 LAB — URINE MICROSCOPIC-ADD ON

## 2016-08-11 LAB — I-STAT CG4 LACTIC ACID, ED
LACTIC ACID, VENOUS: 1.51 mmol/L (ref 0.5–1.9)
Lactic Acid, Venous: 1.81 mmol/L (ref 0.5–1.9)

## 2016-08-11 LAB — BASIC METABOLIC PANEL
Anion gap: 12 (ref 5–15)
BUN: 47 mg/dL — AB (ref 6–20)
CHLORIDE: 104 mmol/L (ref 101–111)
CO2: 19 mmol/L — AB (ref 22–32)
CREATININE: 7.21 mg/dL — AB (ref 0.44–1.00)
Calcium: 8.5 mg/dL — ABNORMAL LOW (ref 8.9–10.3)
GFR calc Af Amer: 7 mL/min — ABNORMAL LOW (ref >60)
GFR calc non Af Amer: 6 mL/min — ABNORMAL LOW (ref >60)
GLUCOSE: 147 mg/dL — AB (ref 65–99)
Potassium: 4.4 mmol/L (ref 3.5–5.1)
Sodium: 135 mmol/L (ref 135–145)

## 2016-08-11 LAB — HEPATIC FUNCTION PANEL
ALT: 22 U/L (ref 14–54)
AST: 27 U/L (ref 15–41)
Albumin: 4.2 g/dL (ref 3.5–5.0)
Alkaline Phosphatase: 136 U/L — ABNORMAL HIGH (ref 38–126)
BILIRUBIN DIRECT: 0.3 mg/dL (ref 0.1–0.5)
BILIRUBIN INDIRECT: 0.6 mg/dL (ref 0.3–0.9)
Total Bilirubin: 0.9 mg/dL (ref 0.3–1.2)
Total Protein: 7.9 g/dL (ref 6.5–8.1)

## 2016-08-11 MED ORDER — PIPERACILLIN-TAZOBACTAM 3.375 G IVPB 30 MIN
3.3750 g | Freq: Once | INTRAVENOUS | Status: AC
  Administered 2016-08-11: 3.375 g via INTRAVENOUS
  Filled 2016-08-11: qty 50

## 2016-08-11 MED ORDER — ACETAMINOPHEN 500 MG PO TABS
ORAL_TABLET | ORAL | Status: AC
  Filled 2016-08-11: qty 2

## 2016-08-11 MED ORDER — ACETAMINOPHEN 500 MG PO TABS
1000.0000 mg | ORAL_TABLET | Freq: Once | ORAL | Status: AC
  Administered 2016-08-11: 1000 mg via ORAL

## 2016-08-11 MED ORDER — SODIUM CHLORIDE 0.9 % IV SOLN
Freq: Once | INTRAVENOUS | Status: AC
  Administered 2016-08-11: 23:00:00 via INTRAVENOUS

## 2016-08-11 MED ORDER — DOXYCYCLINE HYCLATE 100 MG IV SOLR
INTRAVENOUS | Status: AC
  Filled 2016-08-11: qty 100

## 2016-08-11 MED ORDER — DOXYCYCLINE HYCLATE 100 MG IV SOLR
100.0000 mg | Freq: Two times a day (BID) | INTRAVENOUS | Status: DC
  Administered 2016-08-11: 100 mg via INTRAVENOUS
  Filled 2016-08-11 (×2): qty 100

## 2016-08-11 NOTE — ED Triage Notes (Signed)
Pt c/o general malaise, chest congestion, HA, and chills that began this morning. Pt states she took Oxycodone at 0900 this morning. Pt had dialysis yesterday.

## 2016-08-11 NOTE — ED Notes (Addendum)
Went to transport patient back to room. Pt's son became very ugly with this RN as I attempted to wheel patient back to room. Security standing by. Pt's son not allowed back in patient care area at this time due to behavior.

## 2016-08-11 NOTE — ED Notes (Signed)
Reported that pt refused to have labs drawn and wants port a cath accessed for labs

## 2016-08-11 NOTE — Progress Notes (Signed)
There are no stepdown or ICU beds at [facility], spoke to ED physician Dr Kathrynn Humble, will transfer the patient to [facility] ED. She will be moved to stepdown from [facility] ED once bed becomes available.

## 2016-08-11 NOTE — ED Notes (Signed)
Pt returned from MRI, reported that pt unable to have MRI done due to size

## 2016-08-11 NOTE — ED Notes (Signed)
edp notified of pt's temp and request for pain med

## 2016-08-11 NOTE — ED Provider Notes (Addendum)
[facility] DEPT Provider Note   CSN: [id number] Arrival date & time: 08/11/16  1446     History   Chief Complaint Chief Complaint  Patient presents with  . Fatigue    HPI [name] is a 50 y.o. female.  Patient stated that today she started with chills and aching all over and weakness    Fever   This is a new problem. The current episode started 6 to 12 hours ago. The problem occurs constantly. The problem has not changed since onset.Her temperature was unmeasured prior to arrival. Pertinent negatives include no chest pain, no diarrhea, no congestion, no headaches and no cough. She has tried nothing for the symptoms. The treatment provided no relief.    Past Medical History:  Diagnosis Date  . Anxiety   . Asthma    as a child  . Cancer ([place])    thyroid  . Cellulitis   . CHF (congestive heart failure) ([place])   . Chiari malformation    s/p surgery  . Chiari malformation   . Chronic pain   . Complication of anesthesia 11/28/15   Resp arrest after  conscious  sedation  . Constipation   . Coronary artery disease    40-50% mid LAD 04/29/09, Medical tx. (Dr. Gwenlyn Found)  . Diabetes mellitus    Type II  . Fibromyalgia   . History of blood transfusion    hemorrage duinrg pregancy  . Hx of echocardiogram 10/2011   EF 55-60%  . Hypercholesterolemia   . Hypertension   . Lymph edema   . Obesity hypoventilation syndrome ([place])   . On home O2   . Pneumonia    in past  . Renal disorder    Pt started dialysis in Dec.2016  . S/P colonoscopy 05/26/2007   Dr. Laural Golden sigmoid diverticulosis random biopsies benign  . S/P endoscopy 05/01/2009   Dr. Penelope Coop pill-induced esophageal ulcerations distal to midesophagus, 2 small ulcers in the antrum of the stomach  . Shortness of breath dyspnea    with any exertion or if heart rate  is irregular while on dialysis  . Sleep apnea     Patient Active Problem List   Diagnosis Date Noted  . Sepsis ([place]) 08/11/2016  . Coagulase  negative Staphylococcus bacteremia 06/11/2016  . Fever 06/06/2016  . UTI (lower urinary tract infection) 06/06/2016  . ESRD (end stage renal disease) ([place]) 06/06/2016  . Chronic cholecystitis 12/09/2015  . Abdominal pain 12/08/2015  . Protein-calorie malnutrition, severe 11/29/2015  . Respiratory arrest ([place]) 11/28/2015  . Renal failure   . Acute on chronic respiratory failure ([place]) 11/19/2015  . Cellulitis of leg without foot, right 11/13/2015  . Chronic diastolic CHF (congestive heart failure) ([place]) 11/13/2015  . Chronic respiratory failure with hypoxia ([place]) 11/13/2015  . CKD (chronic kidney disease), stage III 11/13/2015  . Anemia of chronic disease 11/13/2015  . Rotator cuff syndrome of right shoulder 05/30/2013  . Pain in joint, shoulder region 05/30/2013  . Nausea & vomiting 09/05/2012  . Hyperkalemia, diminished renal excretion 09/05/2012  . Obesity hypoventilation syndrome ([place]) 09/04/2012  . Hyperkalemia 11/11/2011  . Leukopenia 11/11/2011  . HTN (hypertension) 11/11/2011  . HLD (hyperlipidemia) 11/11/2011  . Transaminitis 11/11/2011  . Diabetes mellitus ([place]) 10/27/2011  . Morbid obesity ([place]) 10/27/2011  . Lymphedema of lower extremity 10/04/2011    Past Surgical History:  Procedure Laterality Date  . Marland KitchenHemodialysis catheter Right 11/28/2015  . ABDOMINAL HYSTERECTOMY    . ADENOIDECTOMY    . AV  FISTULA PLACEMENT Left 03/15/2016   Procedure: CREATION OF LEFT ARM ARTERIOVENOUS (AV) FISTULA  ;  Surgeon: Rosetta Posner, MD;  Location: [facility];  Service: Vascular;  Laterality: Left;  . CESAREAN SECTION      x 2  . CRANIECTOMY SUBOCCIPITAL W/ CERVICAL LAMINECTOMY / CHIARI    . FISTULA SUPERFICIALIZATION Left 05/10/2016   Procedure: Left Arm FISTULA SUPERFICIALIZATION;  Surgeon: Rosetta Posner, MD;  Location: [facility];  Service: Vascular;  Laterality: Left;  . PORTACATH PLACEMENT  07/05/2012   Procedure: INSERTION PORT-A-CATH;  Surgeon: Donato Heinz, MD;  Location: [facility];   Service: General;  Laterality: Left;  subclavian  . THYROIDECTOMY, PARTIAL    . TONSILLECTOMY      OB History    Gravida Para Term Preterm AB Living   _0 SAB TAB Ectopic Multiple Live Births   1               Home Medications    Prior to Admission medications   Medication Sig Start Date End Date Taking? Authorizing Provider  acetaminophen (TYLENOL) 325 MG tablet Take 650 mg by mouth every 6 (six) hours as needed for moderate pain or fever.    Yes Historical Provider, MD  albuterol (PROVENTIL) (2.5 MG/3ML) 0.083% nebulizer solution Take 2.5 mg by nebulization every 6 (six) hours as needed for wheezing or shortness of breath.   Yes Historical Provider, MD  ALPRAZolam Duanne Moron) 0.5 MG tablet Take 0.5 mg by mouth 3 (three) times daily as needed for anxiety or sleep.    Yes Historical Provider, MD  amLODipine (NORVASC) 10 MG tablet Take 0.5 tablets (5 mg total) by mouth daily. 06/11/16  Yes [name], MD  diphenhydrAMINE (BENADRYL) 25 mg capsule Take 25 mg by mouth every 6 (six) hours as needed for allergies.   Yes Historical Provider, MD  metoprolol (LOPRESSOR) 50 MG tablet Take 0.5 tablets (25 mg total) by mouth 2 (two) times daily. 12/04/15  Yes [name], MD  multivitamin (RENA-VIT) TABS tablet Take 1 tablet by mouth daily.   Yes Historical Provider, MD  ondansetron (ZOFRAN-ODT) 4 MG disintegrating tablet Take 4 mg by mouth every 8 (eight) hours as needed for nausea or vomiting.   Yes Historical Provider, MD  Oxycodone HCl 10 MG TABS Take 1 tablet (10 mg total) by mouth 3 (three) times daily as needed (pain). 05/10/16  Yes Alvia Grove, PA-C  sevelamer carbonate (RENVELA) 800 MG tablet Take 1,600-2,700 mg by mouth 3 (three) times daily with meals. Takes 3 tablets with meals and 2 with snacks   Yes Historical Provider, MD  predniSONE (DELTASONE) 50 MG tablet Take 1 tab at 9:30 PM 7/13; take 1 tab at 3:30 AM 7/14; take 1 tab at 9:30 AM, 7/14 prior to procedure. (take  last dose with Benadryl 50 mg.) Patient not taking: Reported on 08/11/2016 06/28/16   Elam Dutch, MD    Family History Family History  Problem Relation Age of Onset  . Colon cancer Mother 49  . Stroke Mother 10  . Coronary artery disease Mother     Social History Social History  Substance Use Topics  . Smoking status: Never Smoker  . Smokeless tobacco: Never Used  . Alcohol use No     Allergies   Contrast media [iodinated diagnostic agents]; Pineapple; Pneumococcal vaccines; Shellfish allergy; and Vancomycin   Review of Systems Review of Systems  Constitutional: Positive for fever. Negative for  appetite change and fatigue.  HENT: Negative for congestion, ear discharge and sinus pressure.   Eyes: Negative for discharge.  Respiratory: Negative for cough.   Cardiovascular: Negative for chest pain.  Gastrointestinal: Negative for abdominal pain and diarrhea.  Genitourinary: Negative for frequency and hematuria.  Musculoskeletal: Positive for myalgias. Negative for back pain.  Skin: Negative for rash.  Neurological: Negative for seizures and headaches.  Psychiatric/Behavioral: Negative for hallucinations.     Physical Exam Updated Vital Signs BP 150/76   Pulse 92   Temp (!) 104.5 F (40.3 C) (Rectal)   Resp (!) 31   Ht _0  (1.651 m)   Wt 270 lb (122.5 kg)   SpO2 97%   BMI 44.93 kg/m   Physical Exam  Constitutional: She is oriented to person, place, and time. She appears well-developed.  HENT:  Head: Normocephalic.  Eyes: Conjunctivae and EOM are normal. No scleral icterus.  Neck: Neck supple. No thyromegaly present.  Cardiovascular: Regular rhythm.  Exam reveals no gallop and no friction rub.   No murmur heard. Tachycardic  Pulmonary/Chest: No stridor. She has no wheezes. She has no rales. She exhibits no tenderness.  Fast respiratory rate  Abdominal: She exhibits no distension. There is no tenderness. There is no rebound.  Musculoskeletal: Normal  range of motion. She exhibits no edema.  Lymphadenopathy:    She has no cervical adenopathy.  Neurological: She is oriented to person, place, and time. She exhibits normal muscle tone. Coordination normal.  Skin: No rash noted. No erythema.  Psychiatric: She has a normal mood and affect. Her behavior is normal.     ED Treatments / Results  Labs (all labs ordered are listed, but only abnormal results are displayed) Labs Reviewed  CBC WITH DIFFERENTIAL/PLATELET - Abnormal; Notable for the following:       Result Value   WBC 22.8 (*)    Neutro Abs 21.1 (*)    All other components within normal limits  BASIC METABOLIC PANEL - Abnormal; Notable for the following:    CO2 19 (*)    Glucose, Bld 147 (*)    BUN 47 (*)    Creatinine, Ser 7.21 (*)    Calcium 8.5 (*)    GFR calc non Af Amer 6 (*)    GFR calc Af Amer 7 (*)    All other components within normal limits  HEPATIC FUNCTION PANEL - Abnormal; Notable for the following:    Alkaline Phosphatase 136 (*)    All other components within normal limits  URINALYSIS, ROUTINE W REFLEX MICROSCOPIC (NOT AT [facility]) - Abnormal; Notable for the following:    APPearance HAZY (*)    Hgb urine dipstick MODERATE (*)    Protein, ur 100 (*)    All other components within normal limits  URINE MICROSCOPIC-ADD ON - Abnormal; Notable for the following:    Squamous Epithelial / LPF 6-30 (*)    Bacteria, UA FEW (*)    All other components within normal limits  CULTURE, BLOOD (ROUTINE X 2)  CULTURE, BLOOD (ROUTINE X 2)  I-STAT CG4 LACTIC ACID, ED    EKG  EKG Interpretation None       Radiology Dg Chest 2 View  Result Date: 08/11/2016 CLINICAL DATA:  Chest congestion with headache and chills starting this morning. EXAM: CHEST  2 VIEW COMPARISON:  06/06/2016 FINDINGS: The lungs are clear wiithout focal pneumonia, edema, pneumothorax or pleural effusion. Left Port-A-Cath tip overlies the distal SVC. The cardio pericardial silhouette is enlarged.  The  visualized bony structures of the thorax are intact. IMPRESSION: No acute cardiopulmonary findings. Electronically Signed   By: Misty Stanley M.D.   On: 08/11/2016 15:47    Procedures Procedures (including critical care time)  Medications Ordered in ED Medications  piperacillin-tazobactam (ZOSYN) IVPB 3.375 g (0 g Intravenous Stopped 08/11/16 1959)  acetaminophen (TYLENOL) tablet 1,000 mg (1,000 mg Oral Given 08/11/16 2021)     Initial Impression / Assessment and Plan / ED Course  I have reviewed the triage vital signs and the nursing notes.  Pertinent labs & imaging results that were available during my care of the patient were reviewed by me and considered in my medical decision making (see chart for details).  Clinical Course  CRITICAL CARE Performed by: Daisuke Bailey L Total critical care time: 40 minutes Critical care time was exclusive of separately billable procedures and treating other patients. Critical care was necessary to treat or prevent imminent or life-threatening deterioration. Critical care was time spent personally by me on the following activities: development of treatment plan with patient and/or surrogate as well as nursing, discussions with consultants, evaluation of patient's response to treatment, examination of patient, obtaining history from patient or surrogate, ordering and performing treatments and interventions, ordering and review of laboratory studies, ordering and review of radiographic studies, pulse oximetry and re-evaluation of patient's condition.   Patient's clinical picture including fever and elevated white blood count suggest sepsis. She will be admitted to stepdown blood cultures have been drawn she is started on antibiotics  Final Clinical Impressions(s) / ED Diagnoses   Final diagnoses:  Acute onset sepsis [facility])  Dr. Darrick Meigs wanted to get a CT of the abdomen. The CT scanner is broken. The patient is being transferred to [facility] for  admission and CT scan  New Prescriptions New Prescriptions   No medications on file     Milton Ferguson, MD 08/11/16 2028    Milton Ferguson, MD 08/11/16 2029    Milton Ferguson, MD 08/11/16 2132

## 2016-08-11 NOTE — ED Notes (Signed)
Report given to [facility]

## 2016-08-11 NOTE — ED Notes (Addendum)
Called supervisor for vibramycin iv, not loaded in our pyxis

## 2016-08-11 NOTE — ED Notes (Signed)
Went to waiting room to call another patient for triage. Patient's family member came in front of me and started hollering "Why haven't ya'll called her back yet. She's been sitting out here for two hours with chest pain. I keep seeing other people go back and ya'll keep leaving her out here." I attempted to explain triage process to family member, and he hollered "I don't care. She shouldn't still be sitting out hereCountrywide Financial [facility] to speak with patient and family in waiting room.

## 2016-08-11 NOTE — H&P (Addendum)
TR[facility] H&P   Patient Demographics:    [name], is a 50 y.o. female  MRN: [id number]  DOB - 1966-02-10  Admit Date - 08/11/2016  Outpatient Primary MD for the patient is [facility], MD  Referring MD/NP/PA: Dr Roderic Palau  Patient coming from: Home  Chief Complaint  Patient presents with  . Fatigue      HPI:    [name]  is a 50 y.o. female, with h/o Hypertension, ESRD on hemodialysis, hyperlipidemia, chronic diastolic heart failure who came to the hospital with 1 day history of fever and chills. Patient says this morning she started having fever, generalized body aches, cough, runny nose. She denies nausea vomiting or diarrhea. No shortness of breath. She also complains of chest pain. Denies dysuria. In the ED patient found to have leukocytosis with white count 22,000, fever of 104.5, tachypnea, tachycardia. Lactic acid 1.8, started empirically on IV Zosyn. Vancomycin not given due to vancomycin allergy.    Review of systems:    In addition to the HPI above,  No Headache, No changes with Vision or hearing, No problems swallowing food or Liquids, No Chest pain, Cough or Shortness of Breath, No Abdominal pain, No Nausea or Vomiting, bowel movements are regular, No Blood in stool or Urine, No dysuria, No new skin rashes or bruises, No new joints pains-aches,  No new weakness, tingling, numbness in any extremity, No recent weight gain or loss, No polyuria, polydypsia or polyphagia, No significant Mental Stressors.  A full 10 point Review of Systems was done, except as stated above, all other Review of Systems were negative.   With Past History of the following :    Past Medical History:  Diagnosis Date  . Anxiety   . Asthma    as a child  . Cancer ([place])    thyroid  . Cellulitis   . CHF (congestive heart failure) ([place])   . Chiari malformation    s/p surgery  . Chiari  malformation   . Chronic pain   . Complication of anesthesia 11/28/15   Resp arrest after  conscious  sedation  . Constipation   . Coronary artery disease    40-50% mid LAD 04/29/09, Medical tx. (Dr. Gwenlyn Found)  . Diabetes mellitus    Type II  . Fibromyalgia   . History of blood transfusion    hemorrage duinrg pregancy  . Hx of echocardiogram 10/2011   EF 55-60%  . Hypercholesterolemia   . Hypertension   . Lymph edema   . Obesity hypoventilation syndrome ([place])   . On home O2   . Pneumonia    in past  . Renal disorder    Pt started dialysis in Dec.2016  . S/P colonoscopy 05/26/2007   Dr. Laural Golden sigmoid diverticulosis random biopsies benign  . S/P endoscopy 05/01/2009   Dr. Penelope Coop pill-induced esophageal ulcerations distal to midesophagus, 2 small ulcers in the antrum of the stomach  . Shortness of breath dyspnea    with any exertion or if heart rate  is irregular while on dialysis  . Sleep apnea       Past Surgical History:  Procedure Laterality Date  . Marland KitchenHemodialysis catheter Right 11/28/2015  . ABDOMINAL HYSTERECTOMY    . ADENOIDECTOMY    . AV FISTULA PLACEMENT Left 03/15/2016   Procedure: CREATION OF LEFT ARM ARTERIOVENOUS (AV) FISTULA  ;  Surgeon: Rosetta Posner, MD;  Location: [facility];  Service: Vascular;  Laterality: Left;  . CESAREAN SECTION      x 2  . CRANIECTOMY SUBOCCIPITAL W/ CERVICAL LAMINECTOMY / CHIARI    . FISTULA SUPERFICIALIZATION Left 05/10/2016   Procedure: Left Arm FISTULA SUPERFICIALIZATION;  Surgeon: Rosetta Posner, MD;  Location: [place];  Service: Vascular;  Laterality: Left;  . PORTACATH PLACEMENT  07/05/2012   Procedure: INSERTION PORT-A-CATH;  Surgeon: Donato Heinz, MD;  Location[facility];  Service: General;  Laterality: Left;  subclavian  . THYROIDECTOMY, PARTIAL    . TONSILLECTOMY        Social History:     Social History  Substance Use Topics  . Smoking status: Never Smoker  . Smokeless tobacco: Never Used  . Alcohol use No        Family  History :     Family History  Problem Relation Age of Onset  . Colon cancer Mother 71  . Stroke Mother 24  . Coronary artery disease Mother       Home Medications:   Prior to Admission medications   Medication Sig Start Date End Date Taking? Authorizing Provider  acetaminophen (TYLENOL) 325 MG tablet Take 650 mg by mouth every 6 (six) hours as needed for moderate pain or fever.    Yes Historical Provider, MD  albuterol (PROVENTIL) (2.5 MG/3ML) 0.083% nebulizer solution Take 2.5 mg by nebulization every 6 (six) hours as needed for wheezing or shortness of breath.   Yes Historical Provider, MD  ALPRAZolam Duanne Moron) 0.5 MG tablet Take 0.5 mg by mouth 3 (three) times daily as needed for anxiety or sleep.    Yes Historical Provider, MD  amLODipine (NORVASC) 10 MG tablet Take 0.5 tablets (5 mg total) by mouth daily. 06/11/16  Yes [name], MD  diphenhydrAMINE (BENADRYL) 25 mg capsule Take 25 mg by mouth every 6 (six) hours as needed for allergies.   Yes Historical Provider, MD  metoprolol (LOPRESSOR) 50 MG tablet Take 0.5 tablets (25 mg total) by mouth 2 (two) times daily. 12/04/15  Yes [name], MD  multivitamin (RENA-VIT) TABS tablet Take 1 tablet by mouth daily.   Yes Historical Provider, MD  ondansetron (ZOFRAN-ODT) 4 MG disintegrating tablet Take 4 mg by mouth every 8 (eight) hours as needed for nausea or vomiting.   Yes Historical Provider, MD  Oxycodone HCl 10 MG TABS Take 1 tablet (10 mg total) by mouth 3 (three) times daily as needed (pain). 05/10/16  Yes Alvia Grove, PA-C  sevelamer carbonate (RENVELA) 800 MG tablet Take 1,600-2,700 mg by mouth 3 (three) times daily with meals. Takes 3 tablets with meals and 2 with snacks   Yes Historical Provider, MD  predniSONE (DELTASONE) 50 MG tablet Take 1 tab at 9:30 PM 7/13; take 1 tab at 3:30 AM 7/14; take 1 tab at 9:30 AM, 7/14 prior to procedure. (take last dose with Benadryl 50 mg.) Patient not taking: Reported on 08/11/2016  06/28/16   Elam Dutch, MD     Allergies:     Allergies  Allergen Reactions  . Contrast Media [Iodinated Diagnostic Agents] Anaphylaxis,  Hives and Swelling    Dye for cardiac cath. Tongue swells  . Pineapple Anaphylaxis, Itching and Swelling  . Pneumococcal Vaccines Swelling    Turns skin black, and bodily swelling  . Shellfish Allergy Itching and Swelling    Facial swelling   . Vancomycin Nausea And Vomiting    Infusion "made me feel like I was dying" had to be readmitted to hospital     Physical Exam:   Vitals  Blood pressure 150/76, pulse 92, temperature (!) 104.5 F (40.3 C), temperature source Rectal, resp. rate (!) 31, height _0  (1.651 m), weight 114.5 kg (252 lb 8 oz), SpO2 97 %.   1. General African-American female lying in bed in NAD, cooperative with exam  2. Normal affect and insight, Awake Alert, Oriented X 3.  3. No F.N deficits, ALL C.Nerves Intact, Strength 5/5 all 4 extremities, Sensation intact all 4 extremities, Plantars down going.  4. Ears and Eyes appear Normal, Conjunctivae clear, PERRLA. Moist Oral Mucosa.  5. Supple Neck, No JVD, No cervical lymphadenopathy appriciated, No Carotid Bruits.  6. Symmetrical Chest wall movement, Good air movement bilaterally, CTAB.  7. RRR, No Gallops, Rubs or Murmurs, No Parasternal Heave.No Leg edema  8. Positive Bowel Sounds, Abdomen Soft, positive left lower quadrant tenderness to palpation, No organomegaly appriciated,No rebound -guarding or rigidity.  9.  No Cyanosis, Normal Skin Turgor, No Skin Rash or Bruise.  10. Left upper extremity- warm to touch, no erythema noted.      Data Review:    CBC  Recent Labs Lab 08/11/16 1831  WBC 22.8*  HGB 12.8  HCT 38.4  PLT 165  MCV 96.5  MCH 32.2  MCHC 33.3  RDW 14.7  LYMPHSABS 1.1  MONOABS 0.6  EOSABS 0.1  BASOSABS 0.0    ------------------------------------------------------------------------------------------------------------------  Chemistries   Recent Labs Lab 08/11/16 1831  NA 135  K 4.4  CL 104  CO2 19*  GLUCOSE 147*  BUN 47*  CREATININE 7.21*  CALCIUM 8.5*  AST 27  ALT 22  ALKPHOS 136*  BILITOT 0.9   ------------------------------------------------------------------------------------------------------------------  ------------------------------------------------------------------------------------------------------------------ GFR: Estimated Creatinine Clearance: 11.8 mL/min (by C-G formula based on SCr of 7.21 mg/dL). Liver Function Tests:  Recent Labs Lab 08/11/16 1831  AST 27  ALT 22  ALKPHOS 136*  BILITOT 0.9  PROT 7.9  ALBUMIN 4.2    --------------------------------------------------------------------------------------------------------------- Urine analysis:    Component Value Date/Time   COLORURINE YELLOW 08/11/2016 1800   APPEARANCEUR HAZY (A) 08/11/2016 1800   LABSPEC 1.020 08/11/2016 1800   PHURINE 5.5 08/11/2016 1800   GLUCOSEU NEGATIVE 08/11/2016 1800   HGBUR MODERATE (A) 08/11/2016 1800   BILIRUBINUR NEGATIVE 08/11/2016 1800   KETONESUR NEGATIVE 08/11/2016 1800   PROTEINUR 100 (A) 08/11/2016 1800   UROBILINOGEN 0.2 12/02/2014 1925   NITRITE NEGATIVE 08/11/2016 1800   LEUKOCYTESUR NEGATIVE 08/11/2016 1800      ----------------------------------------------------------------------------------------------------------------   Imaging Results:    Dg Chest 2 View  Result Date: 08/11/2016 CLINICAL DATA:  Chest congestion with headache and chills starting this morning. EXAM: CHEST  2 VIEW COMPARISON:  06/06/2016 FINDINGS: The lungs are clear wiithout focal pneumonia, edema, pneumothorax or pleural effusion. Left Port-A-Cath tip overlies the distal SVC. The cardio pericardial silhouette is enlarged. The visualized bony structures of the thorax are intact.  IMPRESSION: No acute cardiopulmonary findings. Electronically Signed   By: Misty Stanley M.D.   On: 08/11/2016 15:47    My personal review of EKG: Rhythm NSR   Assessment & Plan:  Active Problems:   Sepsis ([place])   ESRD   Hypertenion  1. Sepsis- patient presenting with sepsis, cultures 2 drawn, started on IV Zosyn. We'll start IV doxycycline to cover for MRSA, patient has a vancomycin allergy. Patient does not require fluid boluses as blood pressure is stable, also she has end-stage renal disease on hemodialysis. Lactic acid 1.8 2. Abdominal tenderness- patient has left lower quadrant tenderness on palpation, patient to be  transferred to [facility] to obtain CT abdomen pelvis without contrast ( patient has contrast allergy). CT scan not available at [facility] tonight. 3. ESRD-on hemodialysis, consult nephrology in a.m.    DVT Prophylaxis-   Heparin  AM Labs Ordered, also please review Full Orders  Family Communication: Admission, patients condition and plan of care including tests being ordered have been discussed with the patient and her husband at bedside* who indicate understanding and agree with the plan and Code Status.  Code Status:  Full code  Admission status: Observation    Time spent in minutes : 60 min   [name] S M.D on 08/11/2016 at 9:08 PM  Between 7am to 7pm - Pager - [phone]. After 7pm go to [email or web address] - password [facility]  [facility] - Office  [phone]

## 2016-08-12 ENCOUNTER — Inpatient Hospital Stay (HOSPITAL_COMMUNITY): Payer: Medicare Other

## 2016-08-12 DIAGNOSIS — Z992 Dependence on renal dialysis: Secondary | ICD-10-CM

## 2016-08-12 DIAGNOSIS — A419 Sepsis, unspecified organism: Principal | ICD-10-CM

## 2016-08-12 DIAGNOSIS — N186 End stage renal disease: Secondary | ICD-10-CM

## 2016-08-12 LAB — CBC
HCT: 34.5 % — ABNORMAL LOW (ref 36.0–46.0)
HCT: 32.6 % — ABNORMAL LOW (ref 36.0–46.0)
Hemoglobin: 11 g/dL — ABNORMAL LOW (ref 12.0–15.0)
Hemoglobin: 10.3 g/dL — ABNORMAL LOW (ref 12.0–15.0)
MCH: 30.6 pg (ref 26.0–34.0)
MCH: 30.7 pg (ref 26.0–34.0)
MCHC: 31.9 g/dL (ref 30.0–36.0)
MCHC: 31.6 g/dL (ref 30.0–36.0)
MCV: 96.1 fL (ref 78.0–100.0)
MCV: 97.3 fL (ref 78.0–100.0)
PLATELETS: 174 10*3/uL (ref 150–400)
Platelets: 167 10*3/uL (ref 150–400)
RBC: 3.59 MIL/uL — ABNORMAL LOW (ref 3.87–5.11)
RBC: 3.35 MIL/uL — ABNORMAL LOW (ref 3.87–5.11)
RDW: 15 % (ref 11.5–15.5)
RDW: 14.9 % (ref 11.5–15.5)
WBC: 19 10*3/uL — ABNORMAL HIGH (ref 4.0–10.5)
WBC: 14.3 10*3/uL — ABNORMAL HIGH (ref 4.0–10.5)

## 2016-08-12 LAB — COMPREHENSIVE METABOLIC PANEL
ALT: 15 U/L (ref 14–54)
AST: 16 U/L (ref 15–41)
Albumin: 3 g/dL — ABNORMAL LOW (ref 3.5–5.0)
Alkaline Phosphatase: 91 U/L (ref 38–126)
Anion gap: 11 (ref 5–15)
BILIRUBIN TOTAL: 1.1 mg/dL (ref 0.3–1.2)
BUN: 48 mg/dL — AB (ref 6–20)
CO2: 22 mmol/L (ref 22–32)
CREATININE: 7.74 mg/dL — AB (ref 0.44–1.00)
Calcium: 8.3 mg/dL — ABNORMAL LOW (ref 8.9–10.3)
Chloride: 102 mmol/L (ref 101–111)
GFR calc Af Amer: 6 mL/min — ABNORMAL LOW (ref >60)
GFR, EST NON AFRICAN AMERICAN: 5 mL/min — AB (ref >60)
GLUCOSE: 123 mg/dL — AB (ref 65–99)
POTASSIUM: 4.4 mmol/L (ref 3.5–5.1)
Sodium: 135 mmol/L (ref 135–145)
TOTAL PROTEIN: 6.2 g/dL — AB (ref 6.5–8.1)

## 2016-08-12 LAB — RENAL FUNCTION PANEL
Albumin: 2.9 g/dL — ABNORMAL LOW (ref 3.5–5.0)
Anion gap: 13 (ref 5–15)
BUN: 52 mg/dL — ABNORMAL HIGH (ref 6–20)
CO2: 20 mmol/L — ABNORMAL LOW (ref 22–32)
Calcium: 8.3 mg/dL — ABNORMAL LOW (ref 8.9–10.3)
Chloride: 103 mmol/L (ref 101–111)
Creatinine, Ser: 7.81 mg/dL — ABNORMAL HIGH (ref 0.44–1.00)
GFR calc Af Amer: 6 mL/min — ABNORMAL LOW (ref >60)
GFR calc non Af Amer: 5 mL/min — ABNORMAL LOW (ref >60)
Glucose, Bld: 105 mg/dL — ABNORMAL HIGH (ref 65–99)
Phosphorus: 5.9 mg/dL — ABNORMAL HIGH (ref 2.5–4.6)
Potassium: 4.6 mmol/L (ref 3.5–5.1)
Sodium: 136 mmol/L (ref 135–145)

## 2016-08-12 LAB — MRSA PCR SCREENING: MRSA by PCR: NEGATIVE

## 2016-08-12 MED ORDER — DEXTROSE 5 % IV SOLN
100.0000 mg | Freq: Two times a day (BID) | INTRAVENOUS | Status: DC
  Administered 2016-08-12: 100 mg via INTRAVENOUS
  Filled 2016-08-12 (×5): qty 100

## 2016-08-12 MED ORDER — DOXERCALCIFEROL 4 MCG/2ML IV SOLN
INTRAVENOUS | Status: AC
  Administered 2016-08-12: 5 ug via INTRAVENOUS
  Filled 2016-08-12: qty 2

## 2016-08-12 MED ORDER — ACETAMINOPHEN 650 MG RE SUPP: 650.0000 mg | Freq: Four times a day (QID) | RECTAL | Status: DC | PRN

## 2016-08-12 MED ORDER — SODIUM CHLORIDE 0.9 % IV SOLN: 100.0000 mL | INTRAVENOUS | Status: DC | PRN

## 2016-08-12 MED ORDER — PIPERACILLIN-TAZOBACTAM 3.375 G IVPB
3.3750 g | Freq: Two times a day (BID) | INTRAVENOUS | Status: DC
  Administered 2016-08-12 – 2016-08-13 (×3): 3.375 g via INTRAVENOUS
  Filled 2016-08-12 (×5): qty 50

## 2016-08-12 MED ORDER — ALPRAZOLAM 0.5 MG PO TABS
0.5000 mg | ORAL_TABLET | Freq: Three times a day (TID) | ORAL | Status: DC | PRN
  Filled 2016-08-12: qty 1

## 2016-08-12 MED ORDER — BARIUM SULFATE 2.1 % PO SUSP
ORAL | Status: AC
  Filled 2016-08-12: qty 2

## 2016-08-12 MED ORDER — ORAL CARE MOUTH RINSE
7.0000 mL | Freq: Two times a day (BID) | OROMUCOSAL | Status: DC
  Administered 2016-08-12 – 2016-08-15 (×4): 7 mL via OROMUCOSAL

## 2016-08-12 MED ORDER — LIDOCAINE HCL (PF) 1 % IJ SOLN: 5.0000 mL | INTRAMUSCULAR | Status: DC | PRN

## 2016-08-12 MED ORDER — HEPARIN SODIUM (PORCINE) 5000 UNIT/ML IJ SOLN
5000.0000 [IU] | Freq: Three times a day (TID) | INTRAMUSCULAR | Status: DC
  Administered 2016-08-12 – 2016-08-15 (×8): 5000 [IU] via SUBCUTANEOUS
  Filled 2016-08-12 (×8): qty 1

## 2016-08-12 MED ORDER — SODIUM CHLORIDE 0.9 % IV BOLUS (SEPSIS)
2000.0000 mL | Freq: Once | INTRAVENOUS | Status: AC
  Administered 2016-08-12: 2000 mL via INTRAVENOUS

## 2016-08-12 MED ORDER — ALTEPLASE 2 MG IJ SOLR: 2.0000 mg | Freq: Once | INTRAMUSCULAR | Status: DC | PRN

## 2016-08-12 MED ORDER — DARBEPOETIN ALFA 25 MCG/0.42ML IJ SOSY
PREFILLED_SYRINGE | INTRAMUSCULAR | Status: AC
  Administered 2016-08-12: 25 ug via INTRAVENOUS
  Filled 2016-08-12: qty 0.42

## 2016-08-12 MED ORDER — ACETAMINOPHEN 325 MG PO TABS: 650.0000 mg | ORAL_TABLET | Freq: Four times a day (QID) | ORAL | Status: DC | PRN

## 2016-08-12 MED ORDER — ONDANSETRON HCL 4 MG PO TABS: 4.0000 mg | ORAL_TABLET | Freq: Four times a day (QID) | ORAL | Status: DC | PRN

## 2016-08-12 MED ORDER — SODIUM CHLORIDE 0.9 % IV BOLUS (SEPSIS): 1000.0000 mL | Freq: Once | INTRAVENOUS | Status: DC

## 2016-08-12 MED ORDER — LIDOCAINE-PRILOCAINE 2.5-2.5 % EX CREA
1.0000 "application " | TOPICAL_CREAM | CUTANEOUS | Status: DC | PRN
  Filled 2016-08-12: qty 5

## 2016-08-12 MED ORDER — DARBEPOETIN ALFA 25 MCG/0.42ML IJ SOSY
25.0000 ug | PREFILLED_SYRINGE | INTRAMUSCULAR | Status: DC
  Administered 2016-08-12: 25 ug via INTRAVENOUS

## 2016-08-12 MED ORDER — ONDANSETRON HCL 4 MG/2ML IJ SOLN: 4.0000 mg | Freq: Four times a day (QID) | INTRAMUSCULAR | Status: DC | PRN

## 2016-08-12 MED ORDER — HEPARIN SODIUM (PORCINE) 1000 UNIT/ML DIALYSIS
1000.0000 [IU] | INTRAMUSCULAR | Status: DC | PRN
  Filled 2016-08-12: qty 1

## 2016-08-12 MED ORDER — PENTAFLUOROPROP-TETRAFLUOROETH EX AERO: 1.0000 "application " | INHALATION_SPRAY | CUTANEOUS | Status: DC | PRN

## 2016-08-12 MED ORDER — HYDROMORPHONE HCL 1 MG/ML IJ SOLN
1.0000 mg | INTRAMUSCULAR | Status: DC | PRN
  Administered 2016-08-12: 0.5 mg via INTRAVENOUS
  Administered 2016-08-12 (×2): 1 mg via INTRAVENOUS
  Administered 2016-08-12: 0.5 mg via INTRAVENOUS
  Administered 2016-08-13 (×2): 1 mg via INTRAVENOUS
  Filled 2016-08-12 (×5): qty 1

## 2016-08-12 MED ORDER — SODIUM CHLORIDE 0.9 % IV SOLN
INTRAVENOUS | Status: DC
  Administered 2016-08-12: 08:00:00 via INTRAVENOUS

## 2016-08-12 MED ORDER — DOXERCALCIFEROL 4 MCG/2ML IV SOLN
5.0000 ug | INTRAVENOUS | Status: DC
  Administered 2016-08-12 – 2016-08-14 (×2): 5 ug via INTRAVENOUS
  Filled 2016-08-12: qty 4

## 2016-08-12 MED ORDER — DOXERCALCIFEROL 4 MCG/2ML IV SOLN
INTRAVENOUS | Status: AC
  Filled 2016-08-12: qty 2

## 2016-08-12 MED ORDER — HEPARIN SODIUM (PORCINE) 1000 UNIT/ML DIALYSIS
20.0000 [IU]/kg | INTRAMUSCULAR | Status: DC | PRN
  Filled 2016-08-12: qty 3

## 2016-08-12 NOTE — Progress Notes (Signed)
CRITICAL VALUE ALERT  Critical value received:  POSITIVE blood cultures - Gram POSITIVE cocci in AEROBIC bottle (from [facility] lab)  Date of notification:  08/12/16  Time of notification:  2145  Critical value read back:Yes.    Nurse who received alert:  Sylvan Cheese, RN   MD notified (1st page):  Tylene Fantasia

## 2016-08-12 NOTE — Progress Notes (Signed)
Hemodialysis- Tolerated well without issue. No UF per order. Report called to [facility] on 3S. Pt currently has no complaints. Vitals stable.

## 2016-08-12 NOTE — Procedures (Signed)
  I was present at this dialysis session, have reviewed the session itself and made  appropriate changes Kelly Splinter MD [place] pager [phone]    cell [phone] 08/12/2016, 3:39 PM

## 2016-08-12 NOTE — ED Notes (Signed)
Report given to Cyril Mourning, dialysis RN

## 2016-08-12 NOTE — Consult Note (Signed)
[facility] Renal Consultation Note    Indication for Consultation:  Management of ESRD/hemodialysis; anemia, hypertension/volume and secondary hyperparathyroidism PCP: [name], MD  HPI: [name] is a 50 y.o. female  With ESRD, HTN, DM, hx morbid obesity with greater than 150 pound weight loss with chronic lymphedema, MSSE bacteremia 05/2016 that was treated with 6 weeks of Ancef q HD  from 06/09/16 per June discharge summary. 2 D Echo 6/19 was negative for vegetations with EF 60 - 65%.  She has been dialyzing with left AVF.  Pt presented to the ED last evening with fever, chills, weakness that had started that morning.  Temp was 104.5 and WBC 22.  CXR was neg. CT of the abdomen and pelvis showed no definitive explanation for her high fever and leukocytosis. After cultures, she was treated with IV doxy (due to Vanc allergy) and Zosyn. She said her last hemodialysis 8/22 was eventful, in fact without any problems at all.  She has had no recent work on her dialysis access.  She ambulates minimally and lives with her disabled husband.At present, she has some nausea and left sided abdominal pain, though she was able to eat some. She has no sores that she is aware of. No dysuria (still makes a small amount of urine), no diarrhea, CP, SOB or cough. Her usually BP run 120 - 140 /70 80s by her report. She has lost about 150 pounds since starting HD last December via fluid loss from dialysis and dieting.  She would like to lose another 100#.  Past Medical History:  Diagnosis Date  . Anxiety   . Asthma    as a child  . Cancer ([place])    thyroid  . Cellulitis   . CHF (congestive heart failure) ([place])   . Chiari malformation    s/p surgery  . Chiari malformation   . Chronic pain   . Complication of anesthesia 11/28/15   Resp arrest after  conscious  sedation  . Constipation   . Coronary artery disease    40-50% mid LAD 04/29/09, Medical tx. (Dr. Gwenlyn Found)  . Diabetes mellitus    Type II  . Fibromyalgia   . History of blood transfusion    hemorrage duinrg pregancy  . Hx of echocardiogram 10/2011   EF 55-60%  . Hypercholesterolemia   . Hypertension   . Lymph edema   . Obesity hypoventilation syndrome ([place])   . On home O2   . Pneumonia    in past  . Renal disorder    Pt started dialysis in Dec.2016  . S/P colonoscopy 05/26/2007   Dr. Laural Golden sigmoid diverticulosis random biopsies benign  . S/P endoscopy 05/01/2009   Dr. Penelope Coop pill-induced esophageal ulcerations distal to midesophagus, 2 small ulcers in the antrum of the stomach  . Shortness of breath dyspnea    with any exertion or if heart rate  is irregular while on dialysis  . Sleep apnea    Past Surgical History:  Procedure Laterality Date  . Marland KitchenHemodialysis catheter Right 11/28/2015  . ABDOMINAL HYSTERECTOMY    . ADENOIDECTOMY    . AV FISTULA PLACEMENT Left 03/15/2016   Procedure: CREATION OF LEFT ARM ARTERIOVENOUS (AV) FISTULA  ;  Surgeon: Rosetta Posner, MD;  Location: [facility];  Service: Vascular;  Laterality: Left;  . CESAREAN SECTION      x 2  . CRANIECTOMY SUBOCCIPITAL W/ CERVICAL LAMINECTOMY / CHIARI    . FISTULA SUPERFICIALIZATION Left 05/10/2016   Procedure: Left  Arm FISTULA SUPERFICIALIZATION;  Surgeon: Rosetta Posner, MD;  Location: [facility];  Service: Vascular;  Laterality: Left;  . PORTACATH PLACEMENT  07/05/2012   Procedure: INSERTION PORT-A-CATH;  Surgeon: Donato Heinz, MD;  Location: [facility];  Service: General;  Laterality: Left;  subclavian  . THYROIDECTOMY, PARTIAL    . TONSILLECTOMY     Family History  Problem Relation Age of Onset  . Colon cancer Mother 29  . Stroke Mother 21  . Coronary artery disease Mother    Social History:  reports that she has never smoked. She has never used smokeless tobacco. She reports that she does not drink alcohol or use drugs. Allergies  Allergen Reactions  . Contrast Media [Iodinated Diagnostic Agents] Anaphylaxis, Hives and Swelling    Dye for  cardiac cath. Tongue swells  . Pineapple Anaphylaxis, Itching and Swelling  . Pneumococcal Vaccines Swelling    Turns skin black, and bodily swelling  . Shellfish Allergy Itching and Swelling    Facial swelling   . Vancomycin Nausea And Vomiting    Infusion "made me feel like I was dying" had to be readmitted to hospital   Prior to Admission medications   Medication Sig Start Date End Date Taking? Authorizing Provider  acetaminophen (TYLENOL) 325 MG tablet Take 650 mg by mouth every 6 (six) hours as needed for moderate pain or fever.    Yes Historical Provider, MD  albuterol (PROVENTIL) (2.5 MG/3ML) 0.083% nebulizer solution Take 2.5 mg by nebulization every 6 (six) hours as needed for wheezing or shortness of breath.   Yes Historical Provider, MD  ALPRAZolam Duanne Moron) 0.5 MG tablet Take 0.5 mg by mouth 3 (three) times daily as needed for anxiety or sleep.    Yes Historical Provider, MD  amLODipine (NORVASC) 10 MG tablet Take 0.5 tablets (5 mg total) by mouth daily. 06/11/16  Yes [name], MD  diphenhydrAMINE (BENADRYL) 25 mg capsule Take 25 mg by mouth every 6 (six) hours as needed for allergies.   Yes Historical Provider, MD  metoprolol (LOPRESSOR) 50 MG tablet Take 0.5 tablets (25 mg total) by mouth 2 (two) times daily. 12/04/15  Yes [name], MD  multivitamin (RENA-VIT) TABS tablet Take 1 tablet by mouth daily.   Yes Historical Provider, MD  ondansetron (ZOFRAN-ODT) 4 MG disintegrating tablet Take 4 mg by mouth every 8 (eight) hours as needed for nausea or vomiting.   Yes Historical Provider, MD  Oxycodone HCl 10 MG TABS Take 1 tablet (10 mg total) by mouth 3 (three) times daily as needed (pain). 05/10/16  Yes Alvia Grove, PA-C  sevelamer carbonate (RENVELA) 800 MG tablet Take 1,600-2,700 mg by mouth 3 (three) times daily with meals. Takes 3 tablets with meals and 2 with snacks   Yes Historical Provider, MD  predniSONE (DELTASONE) 50 MG tablet Take 1 tab at 9:30 PM 7/13; take 1  tab at 3:30 AM 7/14; take 1 tab at 9:30 AM, 7/14 prior to procedure. (take last dose with Benadryl 50 mg.) Patient not taking: Reported on 08/11/2016 06/28/16   Elam Dutch, MD   Current Facility-Administered Medications  Medication Dose Route Frequency Provider Last Rate Last Dose  . 0.9 %  sodium chloride infusion   Intravenous Continuous Oswald Hillock, MD 10 mL/hr at 08/12/16 0731    . acetaminophen (TYLENOL) tablet 650 mg  650 mg Oral Q6H PRN Oswald Hillock, MD       Or  . acetaminophen (TYLENOL) suppository 650 mg  650  mg Rectal Q6H PRN Oswald Hillock, MD      . ALPRAZolam Duanne Moron) tablet 0.5 mg  0.5 mg Oral TID PRN Oswald Hillock, MD      . Barium Sulfate 2.1 % SUSP           . Darbepoetin Alfa (ARANESP) injection 25 mcg  25 mcg Intravenous Q Thu-HD Alric Seton, PA-C      . doxercalciferol (HECTOROL) injection 5 mcg  5 mcg Intravenous Q T,Th,Sa-HD Alric Seton, PA-C      . doxycycline (VIBRAMYCIN) 100 mg in dextrose 5 % 250 mL IVPB  100 mg Intravenous Q12H Oswald Hillock, MD      . heparin injection 5,000 Units  5,000 Units Subcutaneous Q8H Oswald Hillock, MD   5,000 Units at 08/12/16 1125  . HYDROmorphone (DILAUDID) injection 1 mg  1 mg Intravenous Q4H PRN Oswald Hillock, MD   1 mg at 08/12/16 0657  . ondansetron (ZOFRAN) tablet 4 mg  4 mg Oral Q6H PRN Oswald Hillock, MD       Or  . ondansetron (ZOFRAN) injection 4 mg  4 mg Intravenous Q6H PRN Oswald Hillock, MD      . piperacillin-tazobactam (ZOSYN) IVPB 3.375 g  3.375 g Intravenous Q12H Franky Macho, [facility]   Stopped at 08/12/16 1127   Current Outpatient Prescriptions  Medication Sig Dispense Refill  . acetaminophen (TYLENOL) 325 MG tablet Take 650 mg by mouth every 6 (six) hours as needed for moderate pain or fever.     Marland Kitchen albuterol (PROVENTIL) (2.5 MG/3ML) 0.083% nebulizer solution Take 2.5 mg by nebulization every 6 (six) hours as needed for wheezing or shortness of breath.    . ALPRAZolam (XANAX) 0.5 MG tablet Take 0.5 mg by mouth 3 (three)  times daily as needed for anxiety or sleep.     Marland Kitchen amLODipine (NORVASC) 10 MG tablet Take 0.5 tablets (5 mg total) by mouth daily. 15 tablet 12  . diphenhydrAMINE (BENADRYL) 25 mg capsule Take 25 mg by mouth every 6 (six) hours as needed for allergies.    . metoprolol (LOPRESSOR) 50 MG tablet Take 0.5 tablets (25 mg total) by mouth 2 (two) times daily. 30 tablet 12  . multivitamin (RENA-VIT) TABS tablet Take 1 tablet by mouth daily.    . ondansetron (ZOFRAN-ODT) 4 MG disintegrating tablet Take 4 mg by mouth every 8 (eight) hours as needed for nausea or vomiting.    . Oxycodone HCl 10 MG TABS Take 1 tablet (10 mg total) by mouth 3 (three) times daily as needed (pain). 15 tablet 0  . sevelamer carbonate (RENVELA) 800 MG tablet Take 1,600-2,700 mg by mouth 3 (three) times daily with meals. Takes 3 tablets with meals and 2 with snacks    . predniSONE (DELTASONE) 50 MG tablet Take 1 tab at 9:30 PM 7/13; take 1 tab at 3:30 AM 7/14; take 1 tab at 9:30 AM, 7/14 prior to procedure. (take last dose with Benadryl 50 mg.) (Patient not taking: Reported on 08/11/2016) 3 tablet 0   Labs: Basic Metabolic Panel:  Recent Labs Lab 08/11/16 1831 08/12/16 0650  NA 135 135  K 4.4 4.4  CL 104 102  CO2 19* 22  GLUCOSE 147* 123*  BUN 47* 48*  CREATININE 7.21* 7.74*  CALCIUM 8.5* 8.3*   Liver Function Tests:  Recent Labs Lab 08/11/16 1831 08/12/16 0650  AST 27 16  ALT 22 15  ALKPHOS 136* 91  BILITOT 0.9 1.1  PROT  7.9 6.2*  ALBUMIN 4.2 3.0*   CBC:  Recent Labs Lab 08/11/16 1831 08/12/16 0650  WBC 22.8* 19.0*  NEUTROABS 21.1*  --   HGB 12.8 11.0*  HCT 38.4 34.5*  MCV 96.5 96.1  PLT 165 174   Studies/Results: Ct Abdomen Pelvis Wo Contrast  Result Date: 08/12/2016 CLINICAL DATA:  Abdominal pain and fever. EXAM: CT ABDOMEN AND PELVIS WITHOUT CONTRAST TECHNIQUE: Multidetector CT imaging of the abdomen and pelvis was performed following the standard protocol without IV contrast. COMPARISON:   Multiple priors most recent CT 06/06/2016. CT 12/02/2014 FINDINGS: Lower chest:  Linear bibasilar atelectasis.  No pleural fluid. Liver: Steatosis.  Prominent in size. Hepatobiliary: Previous focus of air in the gallbladder is no longer seen. Gallbladder appears contracted and is not well visualized. Pancreas: No ductal dilatation or inflammation. Spleen: Normal. Adrenal glands: No nodule. Kidneys: No hydronephrosis. Mild nonspecific perinephric edema is unchanged. No urolithiasis. Stomach/Bowel: Stomach physiologically distended. There are no dilated or thickened small bowel loops. Colonic wall thickening versus peristalsis involving the hepatic flexure and ascending colon. No adjacent pericolonic edema. Multifocal colonic diverticulosis without acute diverticulitis. The appendix is normal. Vascular/Lymphatic: Multiple retroperitoneal lymph nodes which have been present dating back to 2014, many of which are stable, some of which have decreased in size. Enlarged left external iliac and bilateral inguinal lymph nodes are also unchanged. No new or progressive adenopathy. Abdominal aorta is normal in caliber. Mild atherosclerosis without aneurysm. Reproductive: Enlarged right ovary measuring 5.5 cm, unchanged. Uterus appears surgically absent. Bladder: Minimally distended, no wall thickening. Other: No free air, free fluid, or intra-abdominal fluid collection. Fat containing umbilical hernia. Musculoskeletal: There are no acute or suspicious osseous abnormalities. Degenerative change in the spine with primarily facet arthropathy. IMPRESSION: 1. Apparent wall thickening involving the hepatic flexure and ascending colon which may be due to peristalsis, decompressed colon, or less likely mild colitis. There is no adjacent inflammation. 2. Retroperitoneal and pelvic adenopathy which is chronic, stable or mildly improved dating back to 2014, suggesting reactive etiology. 3. Chronic findings include colonic diverticulosis  without diverticulitis, hepatic steatosis, and chronically enlarged right ovary. Electronically Signed   By: Jeb Levering M.D.   On: 08/12/2016 03:22   Dg Chest 2 View  Result Date: 08/11/2016 CLINICAL DATA:  Chest congestion with headache and chills starting this morning. EXAM: CHEST  2 VIEW COMPARISON:  06/06/2016 FINDINGS: The lungs are clear wiithout focal pneumonia, edema, pneumothorax or pleural effusion. Left Port-A-Cath tip overlies the distal SVC. The cardio pericardial silhouette is enlarged. The visualized bony structures of the thorax are intact. IMPRESSION: No acute cardiopulmonary findings. Electronically Signed   By: Misty Stanley M.D.   On: 08/11/2016 15:47    ROS: As per HPI otherwise negative.  Physical Exam: Vitals:   08/12/16 0730 08/12/16 0830 08/12/16 0900 08/12/16 1100  BP: (!) 92/46 (!) 98/54 (!) 97/54 98/56  Pulse: 87 80 84 77  Resp: _0 Temp:      TempSrc:      SpO2: 97% 99% 98% 98%  Weight:      Height:         General: pleasant morbidly obese AAF hot to touch Head: NCAT sclera not icteric several lower teeth otherwise edentulous Neck: Supple, thick Lungs: CTA bilaterally without wheezes, rales, or rhonchi. Breathing isunlabored. Heart: RRR with S1 S2 no rub Abdomen: soft obese, large pannus left sided tenderness Lower extremities:  L > R chronic lymphedema no significant edema,  no open  wounds on feet/legs Neuro: A & O  X 3. Moves all extremities spontaneously. Psych:  Responds to questions appropriately with a normal affect. Dialysis Access: left AVF - warm to touch - but no overt evidence of infection  Dialysis Orders: DAVITA Reids EDW 132 4.75 hours 2 K 2.5 Ca Qb 400 DFR 600  Heparin 200 bolus and 1000 per hour left lower AVF Epogen 2000 hectorol 5 venofer 50 q week- no recent labs available  Assessment/Plan: 1. Sepsis with hx of Vanc allergy- on IV doxy/zosyn - per primary - no clear source though onset was the day following a dialysis  treatment.   2. ESRD -  TTS K 4.4 - plan HD today -will keep even due to relative hypotension cxr clear- limit time to 4 hours today 3. BP/volume  - on norvasc/MTP - holding meds -keep even today 4. Anemia  - Hgb 11 - on very low dose ESA as outpatient - dose low dose Aranesp 25 - hold on weekly Fe for now 5. Metabolic bone disease -  Ca 8.3, renvela 4 ac and 2 with snacks - not taking sensipar even though on outpt med list; continue hectorol 6. Nutrition -alb 3- renal diet/vits 7. Chronic lymphedema  Myriam Jacobson, PA-C [facility] Beeper [phone] 08/12/2016, 11:31 AM   Pt seen, examined and agree w A/P as above. ESRD patient with high fever, unclear source. Patient has port-a-cath and L arm AVF that do not show signs of infection.  Imaging studies are negative.  On empiric abx, cultures pending, not toxic or septic in appearance.  For HD today.  Will follow.  Kelly Splinter MD [employer] pager [phone]    cell [phone] 08/12/2016, 3:35 PM

## 2016-08-12 NOTE — Progress Notes (Signed)
PROGRESS NOTE  [name]  YQI:[id number] DOB: [birth year]  DOA: 08/11/2016 PCP: Harriett Sine, MD   Brief Narrative:  50 year old female with PMH of ESRD on TTS HD (Dr. Lowanda Foster), HTN, DM, morbid obesity with approximately 160 pound intentional weight loss since December 2016, lower extremity lymphedema with recurrent cellulitis-last episode approximately a year ago, MSSE bacteremia, CAD, HLD, OHS, on chronic home oxygen 2 L/m, OSA on nightly CPAP, initially presented to [facility] ED on 8/23 with acute onset of high fevers, generalized body aches, headaches, runny nose and pleuritic anterior chest wall pain. In the ED, met sepsis criteria with fever of 104, leukocytosis of 22,000, chest x-ray negative and CT abdomen without clear reason for her sepsis picture. No sick contacts at home. She was started on IV Zosyn and IV doxycycline (intolerant of IV vancomycin) and transferred to [facility] due to acuity of illness and need for stepdown bed. She states that she does not ambulate at home and moves around with a wheelchair. Last hemodialysis 8/22. Nonsmoker. Has left Port-A-Cath and left arm AV fistula. Makes minimal urine and no dysuria reported. Initially was normotensive but on arrival to [facility] ED, became hypotensive and received IV fluid bolus of about 2.5 L. Nephrology consulted for dialysis needs.   Assessment & Plan:   Active Problems:   Sepsis ([place])   Sepsis  - Unclear etiology. DD: Viral illness, rule out bacteremia related to dialysis.  - Chest x-ray without acute findings. CT abdomen: Apparent wall thickening involving the hepatic flexure and ascending colon >less likely mild colitis in the absence of diarrhea. Patient does have mild left-sided abdominal pain and nausea without vomiting.  - Lactate normal. - Blood cultures 2: Negative to date. - Initially normotensive but became hypotensive and [facility] ED and was bolused with IV fluids and blood pressures improved. - Empirically started on IV  vancomycin and doxycycline. Discussed with infectious M.D. on call, okay to continue current regimen but if she continues to spike fever or becomes unstable then consider changing doxycycline to oral linezolid and formally consulting them.  ESRD on TTS HD - Last dialysis 8/22. Nephrology consulted for HD. Discussed with Dr. Jonnie Finner  Hypertension - As indicated above was transiently hypotensive. Hold antihypertensives.  Anemia - Follow CBCs per  Chronic lower extremity lymphedema - No acute findings  Chronic respiratory failure with hypoxia/OSA - Continue nightly CPAP and oxygen supplementation.  Morbid obesity - Patient has intentionally lost 160 pounds since December 2016.  DM type II - States that she was able to come off of diabetic medications after her weight loss.   DVT prophylaxis: Subcutaneous heparin Code Status: Full  Family Communication: Discussed with patient. No family at bedside.  Disposition Plan: DC home when medically stable, possibly in 2-3 days. Admitted to stepdown unit.    Consultants:   Nephrology   Procedures:   None   Antimicrobials:   IV vancomycin 8/23 >  IV doxycycline 8/23 >    Subjective: Headache and body aches better. No further chest pain. Denies dyspnea, cough, earache, headache, diarrhea. Had mild nausea early on but improved. Normal BM 2 days ago. Mild intermittent left-sided abdominal pain.   Objective:  Vitals:   08/12/16 1649 08/12/16 1708 08/12/16 1730 08/12/16 1750  BP: (!) 101/54 (!) 93/52 107/56 113/57  Pulse: 66 67 63 63  Resp: _0 Temp:    98 F (36.7 C)  TempSrc:    Oral  SpO2:    99%  Weight:  Height:        Intake/Output Summary (Last 24 hours) at 08/12/16 1821 Last data filed at 08/12/16 1750  Gross per 24 hour  Intake             2000 ml  Output                0 ml  Net             2000 ml   Filed Weights   08/11/16 1507 08/11/16 2050  Weight: 122.5 kg (270 lb) 114.5 kg (252 lb 8 oz)     Examination:  General exam: Moderately built and morbidly obese female lying comfortably propped up in the gurney in [facility] ED. Seen this morning.  Respiratory system: Clear to auscultation. Respiratory effort normal. left Port-A-Cath site without acute findings  Cardiovascular system: S1 & S2 heard, RRR. No JVD, murmurs, rubs, gallops or clicks.  Gastrointestinal system: Abdomen is nondistended, soft. Mild left mid quadrant tenderness without peritoneal signs. No organomegaly or masses felt. Normal bowel sounds heard. Central nervous system: Alert and oriented. No focal neurological deficits. Extremities: Symmetric 5 x 5 power. Bilateral lower extremity chronic lymphedema changes without acute findings. Left arm AV fistula site without acute findings.  Skin: No rashes, lesions or ulcers Psychiatry: Judgement and insight appear normal. Mood & affect appropriate.     Data Reviewed: I have personally reviewed following labs and imaging studies  CBC:  Recent Labs Lab 08/11/16 1831 08/12/16 0650 08/12/16 1345  WBC 22.8* 19.0* 14.3*  NEUTROABS 21.1*  --   --   HGB 12.8 11.0* 10.3*  HCT 38.4 34.5* 32.6*  MCV 96.5 96.1 97.3  PLT 165 174 256   Basic Metabolic Panel:  Recent Labs Lab 08/11/16 1831 08/12/16 0650 08/12/16 1345  NA 135 135 136  K 4.4 4.4 4.6  CL 104 102 103  CO2 19* 22 20*  GLUCOSE 147* 123* 105*  BUN 47* 48* 52*  CREATININE 7.21* 7.74* 7.81*  CALCIUM 8.5* 8.3* 8.3*  PHOS  --   --  5.9*   GFR: Estimated Creatinine Clearance: 10.9 mL/min (by C-G formula based on SCr of 7.81 mg/dL). Liver Function Tests:  Recent Labs Lab 08/11/16 1831 08/12/16 0650 08/12/16 1345  AST 27 16  --   ALT 22 15  --   ALKPHOS 136* 91  --   BILITOT 0.9 1.1  --   PROT 7.9 6.2*  --   ALBUMIN 4.2 3.0* 2.9*   No results for input(s): LIPASE, AMYLASE in the last 168 hours. No results for input(s): AMMONIA in the last 168 hours. Coagulation Profile: No results for input(s):  INR, PROTIME in the last 168 hours. Cardiac Enzymes: No results for input(s): CKTOTAL, CKMB, CKMBINDEX, TROPONINI in the last 168 hours. BNP (last 3 results) No results for input(s): PROBNP in the last 8760 hours. HbA1C: No results for input(s): HGBA1C in the last 72 hours. CBG: No results for input(s): GLUCAP in the last 168 hours. Lipid Profile: No results for input(s): CHOL, HDL, LDLCALC, TRIG, CHOLHDL, LDLDIRECT in the last 72 hours. Thyroid Function Tests: No results for input(s): TSH, T4TOTAL, FREET4, T3FREE, THYROIDAB in the last 72 hours. Anemia Panel: No results for input(s): VITAMINB12, FOLATE, FERRITIN, TIBC, IRON, RETICCTPCT in the last 72 hours.  Sepsis Labs:  Recent Labs Lab 08/11/16 1843 08/11/16 2226  LATICACIDVEN 1.81 1.51    Recent Results (from the past 240 hour(s))  Blood Culture (routine x 2)  Status: None (Preliminary result)   Collection Time: 08/11/16  6:31 PM  Result Value Ref Range Status   Specimen Description PORTA CATH  Final   Special Requests BOTTLES DRAWN AEROBIC AND ANAEROBIC 6CC  Final   Culture NO GROWTH < 24 HOURS  Final   Report Status PENDING  Incomplete  Blood Culture (routine x 2)     Status: None (Preliminary result)   Collection Time: 08/11/16  6:41 PM  Result Value Ref Range Status   Specimen Description BLOOD RIGHT ARM  Final   Special Requests BOTTLES DRAWN AEROBIC AND ANAEROBIC 6CC  Final   Culture NO GROWTH < 24 HOURS  Final   Report Status PENDING  Incomplete         Radiology Studies: Ct Abdomen Pelvis Wo Contrast  Result Date: 08/12/2016 CLINICAL DATA:  Abdominal pain and fever. EXAM: CT ABDOMEN AND PELVIS WITHOUT CONTRAST TECHNIQUE: Multidetector CT imaging of the abdomen and pelvis was performed following the standard protocol without IV contrast. COMPARISON:  Multiple priors most recent CT 06/06/2016. CT 12/02/2014 FINDINGS: Lower chest:  Linear bibasilar atelectasis.  No pleural fluid. Liver: Steatosis.   Prominent in size. Hepatobiliary: Previous focus of air in the gallbladder is no longer seen. Gallbladder appears contracted and is not well visualized. Pancreas: No ductal dilatation or inflammation. Spleen: Normal. Adrenal glands: No nodule. Kidneys: No hydronephrosis. Mild nonspecific perinephric edema is unchanged. No urolithiasis. Stomach/Bowel: Stomach physiologically distended. There are no dilated or thickened small bowel loops. Colonic wall thickening versus peristalsis involving the hepatic flexure and ascending colon. No adjacent pericolonic edema. Multifocal colonic diverticulosis without acute diverticulitis. The appendix is normal. Vascular/Lymphatic: Multiple retroperitoneal lymph nodes which have been present dating back to 2014, many of which are stable, some of which have decreased in size. Enlarged left external iliac and bilateral inguinal lymph nodes are also unchanged. No new or progressive adenopathy. Abdominal aorta is normal in caliber. Mild atherosclerosis without aneurysm. Reproductive: Enlarged right ovary measuring 5.5 cm, unchanged. Uterus appears surgically absent. Bladder: Minimally distended, no wall thickening. Other: No free air, free fluid, or intra-abdominal fluid collection. Fat containing umbilical hernia. Musculoskeletal: There are no acute or suspicious osseous abnormalities. Degenerative change in the spine with primarily facet arthropathy. IMPRESSION: 1. Apparent wall thickening involving the hepatic flexure and ascending colon which may be due to peristalsis, decompressed colon, or less likely mild colitis. There is no adjacent inflammation. 2. Retroperitoneal and pelvic adenopathy which is chronic, stable or mildly improved dating back to 2014, suggesting reactive etiology. 3. Chronic findings include colonic diverticulosis without diverticulitis, hepatic steatosis, and chronically enlarged right ovary. Electronically Signed   By: Jeb Levering M.D.   On: 08/12/2016  03:22   Dg Chest 2 View  Result Date: 08/11/2016 CLINICAL DATA:  Chest congestion with headache and chills starting this morning. EXAM: CHEST  2 VIEW COMPARISON:  06/06/2016 FINDINGS: The lungs are clear wiithout focal pneumonia, edema, pneumothorax or pleural effusion. Left Port-A-Cath tip overlies the distal SVC. The cardio pericardial silhouette is enlarged. The visualized bony structures of the thorax are intact. IMPRESSION: No acute cardiopulmonary findings. Electronically Signed   By: Misty Stanley M.D.   On: 08/11/2016 15:47        Scheduled Meds: . darbepoetin (ARANESP) injection - DIALYSIS  25 mcg Intravenous Q Thu-HD  . doxercalciferol  5 mcg Intravenous Q T,Th,Sa-HD  . heparin  5,000 Units Subcutaneous Q8H   Continuous Infusions: . sodium chloride 10 mL/hr at 08/12/16 0731  . sodium  chloride    . sodium chloride    . doxycycline (VIBRAMYCIN) IV    . piperacillin-tazobactam (ZOSYN)  IV Stopped (08/12/16 1127)     LOS: 1 day    Time spent: 35 minutes    Hank Walling, MD [facility] Pager [phone]  If 7PM-7AM, please contact night-coverage [email or web address] Password [facility] 08/12/2016, 6:21 PM

## 2016-08-12 NOTE — ED Notes (Signed)
Patient transferred here from [facility] for admission for sepsis. Pt had fatigue,cough, congestion and fever  Onset today, pt is a dialysis pt and had dialysis yesterday. reports symptoms then started, pt afebrile on arrival today. Reports generalized body pain on arrival here.

## 2016-08-12 NOTE — Progress Notes (Signed)
Pharmacy Antibiotic Note  [name] is a 50 y.o. female admitted on 08/11/2016 with sepsis.  Pharmacy has been consulted for Zosyn dosing. Pt with ESRD.  Pt received Zosyn 3.375gm IV at [facility] ED ~1900  Plan: Zosyn 3.375gm IV q12h - each dose over 4 hours Will f/u micro data, HD tolerance, and pt's clinical condition   Height: 5' 5" (165.1 cm) Weight: 252 lb 8 oz (114.5 kg) IBW/kg (Calculated) : 57  Temp (24hrs), Avg:101.3 F (38.5 C), Min:98.4 F (36.9 C), Max:104.5 F (40.3 C)   Recent Labs Lab 08/11/16 1831 08/11/16 1843 08/11/16 2226  WBC 22.8*  --   --   CREATININE 7.21*  --   --   LATICACIDVEN  --  1.81 1.51    Estimated Creatinine Clearance: 11.8 mL/min (by C-G formula based on SCr of 7.21 mg/dL).    Allergies  Allergen Reactions  . Contrast Media [Iodinated Diagnostic Agents] Anaphylaxis, Hives and Swelling    Dye for cardiac cath. Tongue swells  . Pineapple Anaphylaxis, Itching and Swelling  . Pneumococcal Vaccines Swelling    Turns skin black, and bodily swelling  . Shellfish Allergy Itching and Swelling    Facial swelling   . Vancomycin Nausea And Vomiting    Infusion "made me feel like I was dying" had to be readmitted to hospital    Antimicrobials this admission: 8/23 Zosyn >>   Microbiology results: 8/23 BCx x2:   Thank you for allowing pharmacy to be a part of this patient's care.  Sherlon Handing, PharmD, BCPS Clinical pharmacist, pager [phone] 08/12/2016 6:34 AM

## 2016-08-12 NOTE — ED Provider Notes (Signed)
Patient transferred for sepsis, abdominal pain, and need for CT scan.  She has been accepted by [facility] to [facility].  Will page to inform them that the patient has arrived.  I have ordered the CT scan as well as more IVF for SBP of 90.  Temp is now 100.0 orally.  Will continue to closely monitor.  SBP is now in the low 100s.  CT does not show any acute surgical issues.  Patient will remain in the ED until stepdown bed opens.   Everlene Balls, MD 08/12/16 [phone]

## 2016-08-13 ENCOUNTER — Inpatient Hospital Stay (HOSPITAL_COMMUNITY): Payer: Medicare Other

## 2016-08-13 DIAGNOSIS — R7881 Bacteremia: Secondary | ICD-10-CM

## 2016-08-13 DIAGNOSIS — E785 Hyperlipidemia, unspecified: Secondary | ICD-10-CM

## 2016-08-13 DIAGNOSIS — R200 Anesthesia of skin: Secondary | ICD-10-CM | POA: Diagnosis present

## 2016-08-13 DIAGNOSIS — R509 Fever, unspecified: Secondary | ICD-10-CM

## 2016-08-13 DIAGNOSIS — Z95828 Presence of other vascular implants and grafts: Secondary | ICD-10-CM

## 2016-08-13 DIAGNOSIS — I890 Lymphedema, not elsewhere classified: Secondary | ICD-10-CM

## 2016-08-13 DIAGNOSIS — R202 Paresthesia of skin: Secondary | ICD-10-CM

## 2016-08-13 DIAGNOSIS — I10 Essential (primary) hypertension: Secondary | ICD-10-CM

## 2016-08-13 LAB — CBC
HEMATOCRIT: 31 % — AB (ref 36.0–46.0)
HEMOGLOBIN: 9.7 g/dL — AB (ref 12.0–15.0)
MCH: 30.6 pg (ref 26.0–34.0)
MCHC: 31.3 g/dL (ref 30.0–36.0)
MCV: 97.8 fL (ref 78.0–100.0)
Platelets: 151 10*3/uL (ref 150–400)
RBC: 3.17 MIL/uL — ABNORMAL LOW (ref 3.87–5.11)
RDW: 14.9 % (ref 11.5–15.5)
WBC: 9 10*3/uL (ref 4.0–10.5)

## 2016-08-13 LAB — BLOOD CULTURE ID PANEL (REFLEXED)
Acinetobacter baumannii: NOT DETECTED
CANDIDA ALBICANS: NOT DETECTED
CANDIDA GLABRATA: NOT DETECTED
CANDIDA KRUSEI: NOT DETECTED
CANDIDA PARAPSILOSIS: NOT DETECTED
CANDIDA TROPICALIS: NOT DETECTED
Carbapenem resistance: NOT DETECTED
ENTEROBACTER CLOACAE COMPLEX: NOT DETECTED
ESCHERICHIA COLI: NOT DETECTED
Enterobacteriaceae species: NOT DETECTED
Enterococcus species: NOT DETECTED
HAEMOPHILUS INFLUENZAE: NOT DETECTED
KLEBSIELLA OXYTOCA: NOT DETECTED
KLEBSIELLA PNEUMONIAE: NOT DETECTED
Listeria monocytogenes: NOT DETECTED
METHICILLIN RESISTANCE: DETECTED — AB
Neisseria meningitidis: NOT DETECTED
PROTEUS SPECIES: NOT DETECTED
Pseudomonas aeruginosa: NOT DETECTED
STREPTOCOCCUS PNEUMONIAE: NOT DETECTED
Serratia marcescens: NOT DETECTED
Staphylococcus aureus (BCID): NOT DETECTED
Staphylococcus species: DETECTED — AB
Streptococcus agalactiae: NOT DETECTED
Streptococcus pyogenes: NOT DETECTED
Streptococcus species: NOT DETECTED
Vancomycin resistance: NOT DETECTED

## 2016-08-13 LAB — CK: Total CK: 33 U/L — ABNORMAL LOW (ref 38–234)

## 2016-08-13 LAB — PROTIME-INR
INR: 1.15
PROTHROMBIN TIME: 14.8 s (ref 11.4–15.2)

## 2016-08-13 LAB — GLUCOSE, CAPILLARY: Glucose-Capillary: 170 mg/dL — ABNORMAL HIGH (ref 65–99)

## 2016-08-13 MED ORDER — LORAZEPAM 2 MG/ML IJ SOLN
1.0000 mg | Freq: Once | INTRAMUSCULAR | Status: AC
  Administered 2016-08-13: 1 mg via INTRAVENOUS
  Filled 2016-08-13: qty 1

## 2016-08-13 MED ORDER — OXYCODONE HCL 5 MG PO TABS
10.0000 mg | ORAL_TABLET | Freq: Three times a day (TID) | ORAL | Status: DC | PRN
  Administered 2016-08-13 – 2016-08-15 (×3): 10 mg via ORAL
  Filled 2016-08-13 (×3): qty 2

## 2016-08-13 MED ORDER — ALBUTEROL SULFATE (2.5 MG/3ML) 0.083% IN NEBU: 2.5000 mg | INHALATION_SOLUTION | Freq: Four times a day (QID) | RESPIRATORY_TRACT | Status: DC | PRN

## 2016-08-13 MED ORDER — SODIUM CHLORIDE 0.9 % IV SOLN
915.0000 mg | INTRAVENOUS | Status: DC
  Administered 2016-08-13: 915 mg via INTRAVENOUS
  Filled 2016-08-13: qty 18.3

## 2016-08-13 MED ORDER — ONDANSETRON 4 MG PO TBDP
4.0000 mg | ORAL_TABLET | Freq: Three times a day (TID) | ORAL | Status: DC | PRN
  Administered 2016-08-14: 4 mg via ORAL
  Filled 2016-08-13: qty 1

## 2016-08-13 MED ORDER — RENA-VITE PO TABS
1.0000 | ORAL_TABLET | Freq: Every day | ORAL | Status: DC
  Administered 2016-08-13 – 2016-08-14 (×2): 1 via ORAL
  Filled 2016-08-13 (×2): qty 1

## 2016-08-13 NOTE — Progress Notes (Signed)
Pt arrived to floor at 4.30pm.

## 2016-08-13 NOTE — Progress Notes (Signed)
PHARMACY - PHYSICIAN COMMUNICATION CRITICAL VALUE ALERT - BLOOD CULTURE IDENTIFICATION (BCID)  Results for orders placed or performed during the hospital encounter of 08/11/16  Blood Culture ID Panel (Reflexed) (Collected: 08/11/2016  6:41 PM)  Result Value Ref Range   Enterococcus species NOT DETECTED NOT DETECTED   Vancomycin resistance NOT DETECTED NOT DETECTED   Listeria monocytogenes NOT DETECTED NOT DETECTED   Staphylococcus species DETECTED (A) NOT DETECTED   Staphylococcus aureus NOT DETECTED NOT DETECTED   Methicillin resistance DETECTED (A) NOT DETECTED   Streptococcus species NOT DETECTED NOT DETECTED   Streptococcus agalactiae NOT DETECTED NOT DETECTED   Streptococcus pneumoniae NOT DETECTED NOT DETECTED   Streptococcus pyogenes NOT DETECTED NOT DETECTED   Acinetobacter baumannii NOT DETECTED NOT DETECTED   Enterobacteriaceae species NOT DETECTED NOT DETECTED   Enterobacter cloacae complex NOT DETECTED NOT DETECTED   Escherichia coli NOT DETECTED NOT DETECTED   Klebsiella oxytoca NOT DETECTED NOT DETECTED   Klebsiella pneumoniae NOT DETECTED NOT DETECTED   Proteus species NOT DETECTED NOT DETECTED   Serratia marcescens NOT DETECTED NOT DETECTED   Carbapenem resistance NOT DETECTED NOT DETECTED   Haemophilus influenzae NOT DETECTED NOT DETECTED   Neisseria meningitidis NOT DETECTED NOT DETECTED   Pseudomonas aeruginosa NOT DETECTED NOT DETECTED   Candida albicans NOT DETECTED NOT DETECTED   Candida glabrata NOT DETECTED NOT DETECTED   Candida krusei NOT DETECTED NOT DETECTED   Candida parapsilosis NOT DETECTED NOT DETECTED   Candida tropicalis NOT DETECTED NOT DETECTED    Name of physician (or Provider) Contacted: [place]  Changes to prescribed antibiotics required: no change needed, WBC improving, temp improving, MD to f/u w/ ID.  Leodis Sias T 08/13/2016  4:42 AM

## 2016-08-13 NOTE — Care Management Note (Addendum)
Case Management Note  Patient Details  Name: [name] MRN: [id number] Date of Birth: 15-Dec-1966  Subjective/Objective:   Patient lives with spouse at home, she has a rolling walker and a cane and a hospital bed.  She states she was actively getting HHPT with an agency but could not remember the name, she will let me know later today.  She has home oxygen with [facility] and her husband will bring her tank to hospital for her to travel to go home with at dc.  She has Medicare insurance and states her Medicaid is about to go through also.  She has transportation at discharge.  NCM will cont to follow for dc needs.   79- Patient states she is active with [facility] , NCM spoke with Jairy at [facility] at  [phone] to confirm, she said yes she is active with [facility] and HHPT, will resume at discharge.                  Action/Plan:   Expected Discharge Date:                  Expected Discharge Plan:  [place]  In-House Referral:     Discharge planning Services  CM Consult  Post Acute Care Choice:    Choice offered to:     DME Arranged:    DME Agency:     HH Arranged:    [place] Agency:     Status of Service:  In process, will continue to follow  If discussed at Long Length of Stay Meetings, dates discussed:    Additional Comments:  Zenon Mayo, RN 08/13/2016, 11:06 AM

## 2016-08-13 NOTE — Progress Notes (Signed)
[facility] Progress Note   Subjective: no c/o's, fevers down, feeling better.  Wants more fluid off w HD tomorrow.  Blood cx's 2/2 + for GPC, prob coag neg staph  Vitals:   08/13/16 0340 08/13/16 0500 08/13/16 0838 08/13/16 0900  BP:   122/62   Pulse:  80    Resp: 16  (!) 21   Temp:    97.6 F (36.4 C)  TempSrc:    Axillary  SpO2: 92% 96%    Weight:      Height:        Inpatient medications: . antiseptic oral rinse  7 mL Mouth Rinse BID  . darbepoetin (ARANESP) injection - DIALYSIS  25 mcg Intravenous Q Thu-HD  . doxercalciferol  5 mcg Intravenous Q T,Th,Sa-HD  . doxycycline (VIBRAMYCIN) IV  100 mg Intravenous Q12H  . heparin  5,000 Units Subcutaneous Q8H  . piperacillin-tazobactam (ZOSYN)  IV  3.375 g Intravenous Q12H   . sodium chloride 10 mL/hr at 08/13/16 0000   sodium chloride, sodium chloride, acetaminophen **OR** acetaminophen, ALPRAZolam, alteplase, heparin, heparin, HYDROmorphone (DILAUDID) injection, lidocaine (PF), lidocaine-prilocaine, ondansetron **OR** ondansetron (ZOFRAN) IV, pentafluoroprop-tetrafluoroeth  Exam: Obese pleasant AAF No jvd Chest clear bilat to bases RRR S1S2 Abd obese , large L pannus no erythema Ext chronic lymphedema w tissue redundancy, no open wounds Mild edema L forearm AVF +bruit, no signs of infection L chest port no erythema or tenderness  Dialysis: [facility]    4h 20mn  132kg  Hep 2000 then 1000/ hr  LFA AVF EPO 2000  Hect 5  Venofer 50/wk      Assessment: 1.  Fever/ gram pos sepsis - unclear origin, not likely related to AVF.  Fistulas gen speaking do not get infected, exceptions being wound infection, button-hole infection.  Consider port infection.  Have d/w primary.  2.  HTN/ volume - BP's better, up to 120 /70.  Wt's are inaccurate. Pt feels like she needs fluid off 3.  Hx L chest port - was placed due to recurrent cellulitis admissions and poor IV access 4.  DM 5.  Morbid obesity 6.  Hx MSSE  bacteremia June 2017 - this may be recurrence of that same infection  Plan - HD tomorrow   RKelly SplinterMD [facility] pager [phone]   cell [phone]/25/2017, 9:35 AM    Recent Labs Lab 08/11/16 1831 08/12/16 0650 08/12/16 1345  NA 135 135 136  K 4.4 4.4 4.6  CL 104 102 103  CO2 19* 22 20*  GLUCOSE 147* 123* 105*  BUN 47* 48* 52*  CREATININE 7.21* 7.74* 7.81*  CALCIUM 8.5* 8.3* 8.3*  PHOS  --   --  5.9*    Recent Labs Lab 08/11/16 1831 08/12/16 0650 08/12/16 1345  AST 27 16  --   ALT 22 15  --   ALKPHOS 136* 91  --   BILITOT 0.9 1.1  --   PROT 7.9 6.2*  --   ALBUMIN 4.2 3.0* 2.9*    Recent Labs Lab 08/11/16 1831 08/12/16 0650 08/12/16 1345 08/13/16 0335  WBC 22.8* 19.0* 14.3* 9.0  NEUTROABS 21.1*  --   --   --   HGB 12.8 11.0* 10.3* 9.7*  HCT 38.4 34.5* 32.6* 31.0*  MCV 96.5 96.1 97.3 97.8  PLT 165 174 167 151   Iron/TIBC/Ferritin/ %Sat    Component Value Date/Time   IRON 40 11/29/2015 0410   TIBC 277 11/29/2015 0410   FERRITIN 2214 (  H) 11/13/2011 0500   IRONPCTSAT 14 11/29/2015 0410

## 2016-08-13 NOTE — Progress Notes (Addendum)
  Pharmacy Antibiotic Note  [name] is a 50 y.o. female admitted on 08/11/2016 with sepsis, MRSE bacteremia - source possibly from port-a-cath (ID recommends to remove).  Pharmacy has been consulted per ID for daptomycin dosing - plan 14 days of therapy. ESRD on HD TTS (on schedule). Previously on doxy IV/Zosyn. Reported intolerance to vancomycin. Afebrile, wbc down to wnl, LA 1.51. Noted patient recently lost 160 lb intentionally - monitor for changes in weight.  Plan: Daptomycin 919m (~867mkg) IV q48h - plan 14 days of therapy per ID Baseline CK and weekly Monitor clinical progress, c/s, renal function  Height: _0  (165.1 cm) Weight: 252 lb (114.3 kg) IBW/kg (Calculated) : 57  Temp (24hrs), Avg:98.3 F (36.8 C), Min:97.6 F (36.4 C), Max:99.3 F (37.4 C)   Recent Labs Lab 08/11/16 1831 08/11/16 1843 08/11/16 2226 08/12/16 0650 08/12/16 1345 08/13/16 0335  WBC 22.8*  --   --  19.0* 14.3* 9.0  CREATININE 7.21*  --   --  7.74* 7.81*  --   LATICACIDVEN  --  1.81 1.51  --   --   --     Estimated Creatinine Clearance: 10.9 mL/min (by C-G formula based on SCr of 7.81 mg/dL).    Allergies  Allergen Reactions  . Contrast Media [Iodinated Diagnostic Agents] Anaphylaxis, Hives and Swelling    Dye for cardiac cath. Tongue swells  . Pineapple Anaphylaxis, Itching and Swelling  . Pneumococcal Vaccines Swelling    Turns skin black, and bodily swelling  . Shellfish Allergy Itching and Swelling    Facial swelling   . Vancomycin Nausea And Vomiting    Infusion "made me feel like I was dying" had to be readmitted to hospital    Antimicrobials this admission: 8/23 Zosyn>>8/25 8/24 Doxy IV>>8/25 8/25 dapto>>  Dose adjustments this admission:   Microbiology results: 8/24 mrsa pcr: neg 8/23 BCx2: 2/2 GPC 8/23 BCID: MRSE   Lindsay LampPharmD, BCPS Clinical Pharmacist Pager [phone]/25/2017 3:01 PM

## 2016-08-13 NOTE — Progress Notes (Addendum)
PROGRESS NOTE  [name]  JQZ:[id number] DOB: [birth year]  DOA: 08/11/2016 PCP: Harriett Sine, MD   Brief Narrative:  50 year old female with PMH of ESRD on TTS HD (Dr. Lowanda Foster), HTN, DM, morbid obesity with approximately 160 pound intentional weight loss since December 2016, lower extremity lymphedema with recurrent cellulitis-last episode approximately a year ago, MSSE bacteremia, CAD, HLD, OHS, on chronic home oxygen 2 L/m, OSA on nightly CPAP, initially presented to [facility] ED on 8/23 with acute onset of high fevers, generalized body aches, headaches, runny nose and pleuritic anterior chest wall pain. In the ED, met sepsis criteria with fever of 104, leukocytosis of 22,000, chest x-ray negative and CT abdomen without clear reason for her sepsis picture. No sick contacts at home. She was started on IV Zosyn and IV doxycycline (intolerant of IV vancomycin) and transferred to [facility] due to acuity of illness and need for stepdown bed. She states that she does not ambulate at home and moves around with a wheelchair. Last hemodialysis 8/22. Nonsmoker. Has left Port-A-Cath and left arm AV fistula. Makes minimal urine and no dysuria reported. Initially was normotensive but on arrival to [facility] ED, became hypotensive and received IV fluid bolus of about 2.5 L. Nephrology consulted for dialysis needs.   Assessment & Plan:   Active Problems:   Lymphedema of lower extremity   Diabetes mellitus (HCC)   Morbid obesity (HCC)   HTN (hypertension)   HLD (hyperlipidemia)   Obesity hypoventilation syndrome (HCC)   Recurrent cellulitis of lower leg   Chronic diastolic CHF (congestive heart failure) (HCC)   ESRD (end stage renal disease) (HCC)   Coagulase negative Staphylococcus bacteremia   Sepsis (HCC)   Numbness and tingling in right hand   Sepsis secondary to recurrent coagulase-negative staph bacteremia - Chest x-ray without acute findings. CT abdomen: Apparent wall thickening involving the hepatic  flexure and ascending colon >less likely mild colitis in the absence of diarrhea. Patient does have mild left-sided abdominal pain and nausea without vomiting.  - Lactate normal. - Blood cultures 2: Coagulase-negative staph-one of these cultures was from Port-A-Cath. - Initially normotensive but became hypotensive and [facility] ED and was bolused with IV fluids and blood pressures improved. - Empirically started on IV vancomycin and doxycycline.  - On 8/25, both blood cultures drawn on admission showed coagulase-negative staph. One of these cultures was from Port-A-Cath. Infectious disease was consulted. She has had recurrent coagulase-negative staph bacteremia. The source is not entirely clear but ID is concerned about it coming from her Port-A-Cath. Recommend removing Port-A-Cath (no longer needs Port-A-Cath). Antibiotics changed to IV daptomycin and plan 14 days of therapy.  ESRD on TTS HD - Nephrology consulted for HD. She underwent hemodialysis on 8/24.  Hypertension - As indicated above was transiently hypotensive. Hold antihypertensives. Hypotension resolved.  Anemia - Follow CBCs. Slight drop in her hemoglobin likely secondary to acute illness.  Chronic lower extremity lymphedema - No acute findings  Chronic respiratory failure with hypoxia/OSA - Continue nightly CPAP and oxygen supplementation.  Morbid obesity - Patient has intentionally lost 160 pounds since December 2016.  DM type II - States that she was able to come off of diabetic medications after her weight loss.  Right upper extremity numbness - Unclear etiology. Low index of suspicion for stroke. Checking MRI brain and C-spine without contrast.   DVT prophylaxis: Subcutaneous heparin Code Status: Full  Family Communication: Discussed with patient. No family at bedside.  Disposition Plan: DC home when medically stable, possibly in  1-2 days. Admitted to stepdown unit. Transfer to medical floor 8/25.   Consultants:    Nephrology   Infectious Disease  Procedures:   HD 8/24  Antimicrobials:   IV vancomycin 8/23 > 8/25  IV doxycycline 8/23 > 8/25   Subjective: States that she feels a whole lot better. Headache and body ache and 50% improved. Complaints of new onset right upper extremity numbness from fingers to elbow, intermittent, not associated with weakness. She has similar symptoms in left upper extremity which are chronic. No other strokelike symptoms.  Objective:  Vitals:   08/13/16 0500 08/13/16 0838 08/13/16 0900 08/13/16 1236  BP:  122/62  111/65  Pulse: 80   70  Resp:  (!) 21  17  Temp:   97.6 F (36.4 C) 98 F (36.7 C)  TempSrc:   Axillary Oral  SpO2: 96%   99%  Weight:      Height:        Intake/Output Summary (Last 24 hours) at 08/13/16 1406 Last data filed at 08/13/16 1100  Gross per 24 hour  Intake          1184.83 ml  Output              300 ml  Net           884.83 ml   Filed Weights   08/11/16 1507 08/11/16 2050 08/12/16 1845  Weight: 122.5 kg (270 lb) 114.5 kg (252 lb 8 oz) 114.3 kg (252 lb)    Examination:  General exam: Moderately built and morbidly obese female lying comfortably propped up in bed.  Respiratory system: Clear to auscultation. Respiratory effort normal. left Port-A-Cath site without acute findings  Cardiovascular system: S1 & S2 heard, RRR. No JVD, murmurs, rubs, gallops or clicks. Telemetry: Sinus rhythm. Gastrointestinal system: Abdomen is nondistended, soft. Mild left mid quadrant tenderness (better than yesterday) without peritoneal signs. No organomegaly or masses felt. Normal bowel sounds heard. Central nervous system: Alert and oriented. No focal neurological deficits. Extremities: Symmetric 5 x 5 power. Bilateral lower extremity chronic lymphedema changes without acute findings. Left arm AV fistula site without acute findings. Right upper extremity with good grip and questionable patchy sensory abnormality distal to mid  forearm. Skin: No rashes, lesions or ulcers Psychiatry: Judgement and insight appear normal. Mood & affect appropriate.     Data Reviewed: I have personally reviewed following labs and imaging studies  CBC:  Recent Labs Lab 08/11/16 1831 08/12/16 0650 08/12/16 1345 08/13/16 0335  WBC 22.8* 19.0* 14.3* 9.0  NEUTROABS 21.1*  --   --   --   HGB 12.8 11.0* 10.3* 9.7*  HCT 38.4 34.5* 32.6* 31.0*  MCV 96.5 96.1 97.3 97.8  PLT 165 174 167 655   Basic Metabolic Panel:  Recent Labs Lab 08/11/16 1831 08/12/16 0650 08/12/16 1345  NA 135 135 136  K 4.4 4.4 4.6  CL 104 102 103  CO2 19* 22 20*  GLUCOSE 147* 123* 105*  BUN 47* 48* 52*  CREATININE 7.21* 7.74* 7.81*  CALCIUM 8.5* 8.3* 8.3*  PHOS  --   --  5.9*   GFR: Estimated Creatinine Clearance: 10.9 mL/min (by C-G formula based on SCr of 7.81 mg/dL). Liver Function Tests:  Recent Labs Lab 08/11/16 1831 08/12/16 0650 08/12/16 1345  AST 27 16  --   ALT 22 15  --   ALKPHOS 136* 91  --   BILITOT 0.9 1.1  --   PROT 7.9 6.2*  --  ALBUMIN 4.2 3.0* 2.9*   No results for input(s): LIPASE, AMYLASE in the last 168 hours. No results for input(s): AMMONIA in the last 168 hours. Coagulation Profile: No results for input(s): INR, PROTIME in the last 168 hours. Cardiac Enzymes: No results for input(s): CKTOTAL, CKMB, CKMBINDEX, TROPONINI in the last 168 hours. BNP (last 3 results) No results for input(s): PROBNP in the last 8760 hours. HbA1C: No results for input(s): HGBA1C in the last 72 hours. CBG: No results for input(s): GLUCAP in the last 168 hours. Lipid Profile: No results for input(s): CHOL, HDL, LDLCALC, TRIG, CHOLHDL, LDLDIRECT in the last 72 hours. Thyroid Function Tests: No results for input(s): TSH, T4TOTAL, FREET4, T3FREE, THYROIDAB in the last 72 hours. Anemia Panel: No results for input(s): VITAMINB12, FOLATE, FERRITIN, TIBC, IRON, RETICCTPCT in the last 72 hours.  Sepsis Labs:  Recent Labs Lab  08/11/16 1843 08/11/16 2226  LATICACIDVEN 1.81 1.51    Recent Results (from the past 240 hour(s))  Blood Culture (routine x 2)     Status: None (Preliminary result)   Collection Time: 08/11/16  6:31 PM  Result Value Ref Range Status   Specimen Description PORTA CATH  Final   Special Requests BOTTLES DRAWN AEROBIC AND ANAEROBIC 6CC  Final   Culture  Setup Time   Final    GRAM POSITIVE COCCI Gram Stain Report Called to,Read Back By and Verified With: MILLER,J ON 08/12/16 AT 2150 BY LOY,C PERFORMED AT [facility]    Culture   Final    GRAM POSITIVE COCCI CULTURE REINCUBATED FOR BETTER GROWTH Performed at [facility]    Report Status PENDING  Incomplete  Blood Culture (routine x 2)     Status: None (Preliminary result)   Collection Time: 08/11/16  6:41 PM  Result Value Ref Range Status   Specimen Description BLOOD RIGHT ARM  Final   Special Requests BOTTLES DRAWN AEROBIC AND ANAEROBIC 6CC  Final   Culture  Setup Time   Final    GRAM POSITIVE COCCI Gram Stain Report Called to,Read Back By and Verified With: WORLEY J AT [place] AT 3354 ON [id number] BY FORSYTH K AEROBIC BOTTLE ONLY CRITICAL RESULT CALLED TO, READ BACK BY AND VERIFIED WITH: Colin Rhein PHARMD [id number] 08/13/16 A BROWNING    Culture   Final    GRAM POSITIVE COCCI CULTURE REINCUBATED FOR BETTER GROWTH Performed at [facility]    Report Status PENDING  Incomplete  Blood Culture ID Panel (Reflexed)     Status: Abnormal   Collection Time: 08/11/16  6:41 PM  Result Value Ref Range Status   Enterococcus species NOT DETECTED NOT DETECTED Final   Vancomycin resistance NOT DETECTED NOT DETECTED Final   Listeria monocytogenes NOT DETECTED NOT DETECTED Final   Staphylococcus species DETECTED (A) NOT DETECTED Final    Comment: CRITICAL RESULT CALLED TO, READ BACK BY AND VERIFIED WITH: Colin Rhein PHARMD [id number] 08/13/16 A BROWNING    Staphylococcus aureus NOT DETECTED NOT DETECTED Final   Methicillin resistance DETECTED (A) NOT DETECTED  Final    Comment: CRITICAL RESULT CALLED TO, READ BACK BY AND VERIFIED WITH: Colin Rhein PHARMD [id number] 08/13/16 A BROWNING    Streptococcus species NOT DETECTED NOT DETECTED Final   Streptococcus agalactiae NOT DETECTED NOT DETECTED Final   Streptococcus pneumoniae NOT DETECTED NOT DETECTED Final   Streptococcus pyogenes NOT DETECTED NOT DETECTED Final   Acinetobacter baumannii NOT DETECTED NOT DETECTED Final   Enterobacteriaceae species NOT DETECTED NOT DETECTED Final   Enterobacter  cloacae complex NOT DETECTED NOT DETECTED Final   Escherichia coli NOT DETECTED NOT DETECTED Final   Klebsiella oxytoca NOT DETECTED NOT DETECTED Final   Klebsiella pneumoniae NOT DETECTED NOT DETECTED Final   Proteus species NOT DETECTED NOT DETECTED Final   Serratia marcescens NOT DETECTED NOT DETECTED Final   Carbapenem resistance NOT DETECTED NOT DETECTED Final   Haemophilus influenzae NOT DETECTED NOT DETECTED Final   Neisseria meningitidis NOT DETECTED NOT DETECTED Final   Pseudomonas aeruginosa NOT DETECTED NOT DETECTED Final   Candida albicans NOT DETECTED NOT DETECTED Final   Candida glabrata NOT DETECTED NOT DETECTED Final   Candida krusei NOT DETECTED NOT DETECTED Final   Candida parapsilosis NOT DETECTED NOT DETECTED Final   Candida tropicalis NOT DETECTED NOT DETECTED Final    Comment: Performed at [facility]  MRSA PCR Screening     Status: None   Collection Time: 08/12/16  6:58 PM  Result Value Ref Range Status   MRSA by PCR NEGATIVE NEGATIVE Final    Comment:        The GeneXpert MRSA Assay (FDA approved for NASAL specimens only), is one component of a comprehensive MRSA colonization surveillance program. It is not intended to diagnose MRSA infection nor to guide or monitor treatment for MRSA infections.          Radiology Studies: Ct Abdomen Pelvis Wo Contrast  Result Date: 08/12/2016 CLINICAL DATA:  Abdominal pain and fever. EXAM: CT ABDOMEN AND PELVIS WITHOUT CONTRAST  TECHNIQUE: Multidetector CT imaging of the abdomen and pelvis was performed following the standard protocol without IV contrast. COMPARISON:  Multiple priors most recent CT 06/06/2016. CT 12/02/2014 FINDINGS: Lower chest:  Linear bibasilar atelectasis.  No pleural fluid. Liver: Steatosis.  Prominent in size. Hepatobiliary: Previous focus of air in the gallbladder is no longer seen. Gallbladder appears contracted and is not well visualized. Pancreas: No ductal dilatation or inflammation. Spleen: Normal. Adrenal glands: No nodule. Kidneys: No hydronephrosis. Mild nonspecific perinephric edema is unchanged. No urolithiasis. Stomach/Bowel: Stomach physiologically distended. There are no dilated or thickened small bowel loops. Colonic wall thickening versus peristalsis involving the hepatic flexure and ascending colon. No adjacent pericolonic edema. Multifocal colonic diverticulosis without acute diverticulitis. The appendix is normal. Vascular/Lymphatic: Multiple retroperitoneal lymph nodes which have been present dating back to 2014, many of which are stable, some of which have decreased in size. Enlarged left external iliac and bilateral inguinal lymph nodes are also unchanged. No new or progressive adenopathy. Abdominal aorta is normal in caliber. Mild atherosclerosis without aneurysm. Reproductive: Enlarged right ovary measuring 5.5 cm, unchanged. Uterus appears surgically absent. Bladder: Minimally distended, no wall thickening. Other: No free air, free fluid, or intra-abdominal fluid collection. Fat containing umbilical hernia. Musculoskeletal: There are no acute or suspicious osseous abnormalities. Degenerative change in the spine with primarily facet arthropathy. IMPRESSION: 1. Apparent wall thickening involving the hepatic flexure and ascending colon which may be due to peristalsis, decompressed colon, or less likely mild colitis. There is no adjacent inflammation. 2. Retroperitoneal and pelvic adenopathy which  is chronic, stable or mildly improved dating back to 2014, suggesting reactive etiology. 3. Chronic findings include colonic diverticulosis without diverticulitis, hepatic steatosis, and chronically enlarged right ovary. Electronically Signed   By: Jeb Levering M.D.   On: 08/12/2016 03:22   Dg Chest 2 View  Result Date: 08/11/2016 CLINICAL DATA:  Chest congestion with headache and chills starting this morning. EXAM: CHEST  2 VIEW COMPARISON:  06/06/2016 FINDINGS: The lungs are clear  wiithout focal pneumonia, edema, pneumothorax or pleural effusion. Left Port-A-Cath tip overlies the distal SVC. The cardio pericardial silhouette is enlarged. The visualized bony structures of the thorax are intact. IMPRESSION: No acute cardiopulmonary findings. Electronically Signed   By: Misty Stanley M.D.   On: 08/11/2016 15:47        Scheduled Meds: . antiseptic oral rinse  7 mL Mouth Rinse BID  . darbepoetin (ARANESP) injection - DIALYSIS  25 mcg Intravenous Q Thu-HD  . doxercalciferol  5 mcg Intravenous Q T,Th,Sa-HD  . doxycycline (VIBRAMYCIN) IV  100 mg Intravenous Q12H  . heparin  5,000 Units Subcutaneous Q8H  . piperacillin-tazobactam (ZOSYN)  IV  3.375 g Intravenous Q12H   Continuous Infusions: . sodium chloride 10 mL/hr at 08/13/16 0000     LOS: 2 days    Time spent: 34 minutes    Treesa Mccully, MD [facility] Pager [phone]  If 7PM-7AM, please contact night-coverage [email or web address] Password [email or web address] 08/13/2016, 2:06 PM

## 2016-08-13 NOTE — Consult Note (Signed)
[facility]    Date of Admission:  08/11/2016           Day 2 piperacillin tazobactam        Day 2 doxycycline       Reason for Consult: Methicillin-resistant coagulase-negative staph bacteremia    Referring Physician: Dr. Irine Seal  Active Problems:   Coagulase negative Staphylococcus bacteremia   Lymphedema of lower extremity   Diabetes mellitus ([place])   Morbid obesity ([place])   HTN (hypertension)   HLD (hyperlipidemia)   Obesity hypoventilation syndrome (HCC)   Recurrent cellulitis of lower leg   Chronic diastolic CHF (congestive heart failure) (HCC)   ESRD (end stage renal disease) (HCC)   Sepsis (HCC)   Numbness and tingling in right hand   . antiseptic oral rinse  7 mL Mouth Rinse BID  . darbepoetin (ARANESP) injection - DIALYSIS  25 mcg Intravenous Q Thu-HD  . doxercalciferol  5 mcg Intravenous Q T,Th,Sa-HD  . doxycycline (VIBRAMYCIN) IV  100 mg Intravenous Q12H  . heparin  5,000 Units Subcutaneous Q8H  . piperacillin-tazobactam (ZOSYN)  IV  3.375 g Intravenous Q12H    Recommendations: 1. Change antibiotics to IV daptomycin dosed after hemodialysis for 14 days 2. Please call Dr. Lita Mains [phone] for any infectious disease questions this weekend   Assessment: [name] has had recurrent coagulase-negative staph bacteremia. The source is not entirely clear but I'm concerned about it coming from her Port-A-Cath. She no longer needs the Port-A-Cath now that she can receive IV antibiotics through her AV fistula after hemodialysis. I would recommend having the Port-A-Cath removed. I will change her to IV daptomycin and plan on 14 days of therapy. She has no clinical evidence of endocarditis. This episode of bacteremia was probably caught very early and she is responding promptly to therapy. I do not feel strongly that she needs echocardiography.   HPI: [name] is a 50 y.o. female with end-stage renal  disease on hemodialysis via a left arm AV fistula. She had sudden onset of high fever to 104.5 associated with severe shaking chills, headache and hurting all over leading to admission 2 days ago. Both admission blood cultures have grown methicillin-resistant coagulase-negative staph. She was started on empiric piperacillin tazobactam and doxycycline. Vancomycin was not used because she has a history of an adverse reaction several years ago consisting of "nausea, vomiting and her arms turning black". She defervesced promptly and is feeling much better now. She had methicillin sensitive coagulase-negative staph bacteremia in June. She has had a Port-A-Cath for the last several years. She used to have problems with recurrent lower extremity cellulitis secondary to her morbid obesity and chronic lymphedema. The Port-A-Cath was placed to facilitate frequent IV antibiotic therapy before she started hemodialysis. Since starting hemodialysis she has lost 160 pounds on purpose and her leg swelling has been much less of a problem.   Review of Systems: Review of Systems  Constitutional: Positive for chills, fever and weight loss. Negative for diaphoresis and malaise/fatigue.  Respiratory: Negative for cough, sputum production and shortness of breath.   Cardiovascular: Negative for chest pain.  Gastrointestinal: Negative for abdominal pain, diarrhea, nausea and vomiting.  Musculoskeletal: Negative for joint pain and neck pain.  Skin: Negative for rash.  Neurological: Positive for tingling, sensory change and headaches.       She has noticed some recent numbness in her right hand and arm.  Past Medical History:  Diagnosis Date  . Anxiety   . Asthma    as a child  . Cancer ([place])    thyroid  . Cellulitis   . CHF (congestive heart failure) ([place])   . Chiari malformation    s/p surgery  . Chiari malformation   . Chronic pain   . Complication of anesthesia 11/28/15   Resp arrest after  conscious   sedation  . Constipation   . Coronary artery disease    40-50% mid LAD 04/29/09, Medical tx. (Dr. Gwenlyn Found)  . Diabetes mellitus    Type II  . Fibromyalgia   . History of blood transfusion    hemorrage duinrg pregancy  . Hx of echocardiogram 10/2011   EF 55-60%  . Hypercholesterolemia   . Hypertension   . Lymph edema   . Obesity hypoventilation syndrome ([place])   . On home O2   . Pneumonia    in past  . Renal disorder    Pt started dialysis in Dec.2016  . S/P colonoscopy 05/26/2007   Dr. Laural Golden sigmoid diverticulosis random biopsies benign  . S/P endoscopy 05/01/2009   Dr. Penelope Coop pill-induced esophageal ulcerations distal to midesophagus, 2 small ulcers in the antrum of the stomach  . Shortness of breath dyspnea    with any exertion or if heart rate  is irregular while on dialysis  . Sleep apnea     Social History  Substance Use Topics  . Smoking status: Never Smoker  . Smokeless tobacco: Never Used  . Alcohol use No    Family History  Problem Relation Age of Onset  . Colon cancer Mother 94  . Stroke Mother 33  . Coronary artery disease Mother    Allergies  Allergen Reactions  . Contrast Media [Iodinated Diagnostic Agents] Anaphylaxis, Hives and Swelling    Dye for cardiac cath. Tongue swells  . Pineapple Anaphylaxis, Itching and Swelling  . Pneumococcal Vaccines Swelling    Turns skin black, and bodily swelling  . Shellfish Allergy Itching and Swelling    Facial swelling   . Vancomycin Nausea And Vomiting    Infusion "made me feel like I was dying" had to be readmitted to hospital    OBJECTIVE: Blood pressure 122/62, pulse 80, temperature 97.6 F (36.4 C), temperature source Axillary, resp. rate (!) 21, height _0  (1.651 m), weight 252 lb (114.3 kg), SpO2 96 %.  Physical Exam  Constitutional: She is oriented to person, place, and time.  She is alert and in good spirits.  HENT:  Mouth/Throat: No oropharyngeal exudate.  Eyes: Conjunctivae are normal.    Cardiovascular: Normal rate and regular rhythm.   No murmur heard. Pulmonary/Chest: Effort normal. She has no wheezes. She has no rales.  Left anterior chest Port-A-Cath site looks okay.  Abdominal: Soft. She exhibits no mass. There is no tenderness.  Musculoskeletal: Normal range of motion.  Left arm AV fistula looks good.  Neurological: She is alert and oriented to person, place, and time.  Skin: No rash noted.  Psychiatric: Mood and affect normal.    Lab Results Lab Results  Component Value Date   WBC 9.0 08/13/2016   HGB 9.7 (L) 08/13/2016   HCT 31.0 (L) 08/13/2016   MCV 97.8 08/13/2016   PLT 151 08/13/2016    Lab Results  Component Value Date   CREATININE 7.81 (H) 08/12/2016   BUN 52 (H) 08/12/2016   NA 136 08/12/2016   K 4.6 08/12/2016   CL 103 08/12/2016  CO2 20 (L) 08/12/2016    Lab Results  Component Value Date   ALT 15 08/12/2016   AST 16 08/12/2016   ALKPHOS 91 08/12/2016   BILITOT 1.1 08/12/2016     Microbiology: Recent Results (from the past 240 hour(s))  Blood Culture (routine x 2)     Status: None (Preliminary result)   Collection Time: 08/11/16  6:31 PM  Result Value Ref Range Status   Specimen Description PORTA CATH  Final   Special Requests BOTTLES DRAWN AEROBIC AND ANAEROBIC 6CC  Final   Culture  Setup Time   Final    GRAM POSITIVE COCCI Gram Stain Report Called to,Read Back By and Verified With: MILLER,J ON 08/12/16 AT 2150 BY LOY,C PERFORMED AT [facility]    Culture   Final    GRAM POSITIVE COCCI CULTURE REINCUBATED FOR BETTER GROWTH Performed at [facility]    Report Status PENDING  Incomplete  Blood Culture (routine x 2)     Status: None (Preliminary result)   Collection Time: 08/11/16  6:41 PM  Result Value Ref Range Status   Specimen Description BLOOD RIGHT ARM  Final   Special Requests BOTTLES DRAWN AEROBIC AND ANAEROBIC 6CC  Final   Culture  Setup Time   Final    GRAM POSITIVE COCCI Gram Stain Report Called to,Read Back By  and Verified With: WORLEY J AT [place] [id number] BY FORSYTH K AEROBIC BOTTLE ONLY CRITICAL RESULT CALLED TO, READ BACK BY AND VERIFIED WITH: Colin Rhein PHARMD [id number] 08/13/16 A BROWNING    Culture   Final    GRAM POSITIVE COCCI CULTURE REINCUBATED FOR BETTER GROWTH Performed at [facility]    Report Status PENDING  Incomplete  Blood Culture ID Panel (Reflexed)     Status: Abnormal   Collection Time: 08/11/16  6:41 PM  Result Value Ref Range Status   Enterococcus species NOT DETECTED NOT DETECTED Final   Vancomycin resistance NOT DETECTED NOT DETECTED Final   Listeria monocytogenes NOT DETECTED NOT DETECTED Final   Staphylococcus species DETECTED (A) NOT DETECTED Final    Comment: CRITICAL RESULT CALLED TO, READ BACK BY AND VERIFIED WITH: Colin Rhein PHARMD [id number] 08/13/16 A BROWNING    Staphylococcus aureus NOT DETECTED NOT DETECTED Final   Methicillin resistance DETECTED (A) NOT DETECTED Final    Comment: CRITICAL RESULT CALLED TO, READ BACK BY AND VERIFIED WITH: Colin Rhein PHARMD [id number] 08/13/16 A BROWNING    Streptococcus species NOT DETECTED NOT DETECTED Final   Streptococcus agalactiae NOT DETECTED NOT DETECTED Final   Streptococcus pneumoniae NOT DETECTED NOT DETECTED Final   Streptococcus pyogenes NOT DETECTED NOT DETECTED Final   Acinetobacter baumannii NOT DETECTED NOT DETECTED Final   Enterobacteriaceae species NOT DETECTED NOT DETECTED Final   Enterobacter cloacae complex NOT DETECTED NOT DETECTED Final   Escherichia coli NOT DETECTED NOT DETECTED Final   Klebsiella oxytoca NOT DETECTED NOT DETECTED Final   Klebsiella pneumoniae NOT DETECTED NOT DETECTED Final   Proteus species NOT DETECTED NOT DETECTED Final   Serratia marcescens NOT DETECTED NOT DETECTED Final   Carbapenem resistance NOT DETECTED NOT DETECTED Final   Haemophilus influenzae NOT DETECTED NOT DETECTED Final   Neisseria meningitidis NOT DETECTED NOT DETECTED Final   Pseudomonas aeruginosa NOT DETECTED NOT  DETECTED Final   Candida albicans NOT DETECTED NOT DETECTED Final   Candida glabrata NOT DETECTED NOT DETECTED Final   Candida krusei NOT DETECTED NOT DETECTED Final   Candida parapsilosis NOT DETECTED NOT  DETECTED Final   Candida tropicalis NOT DETECTED NOT DETECTED Final    Comment: Performed at [facility]  MRSA PCR Screening     Status: None   Collection Time: 08/12/16  6:58 PM  Result Value Ref Range Status   MRSA by PCR NEGATIVE NEGATIVE Final    Comment:        The GeneXpert MRSA Assay (FDA approved for NASAL specimens only), is one component of a comprehensive MRSA colonization surveillance program. It is not intended to diagnose MRSA infection nor to guide or monitor treatment for MRSA infections.     Michel Bickers, MD [facility] [phone] pager   [phone] cell 08/13/2016, 12:13 PM

## 2016-08-14 ENCOUNTER — Inpatient Hospital Stay (HOSPITAL_COMMUNITY): Payer: Medicare Other

## 2016-08-14 ENCOUNTER — Encounter (HOSPITAL_COMMUNITY): Payer: Self-pay | Admitting: Interventional Radiology

## 2016-08-14 HISTORY — PX: IR GENERIC HISTORICAL: IMG1180011

## 2016-08-14 LAB — CBC
HCT: 34.5 % — ABNORMAL LOW (ref 36.0–46.0)
Hemoglobin: 11 g/dL — ABNORMAL LOW (ref 12.0–15.0)
MCH: 31.3 pg (ref 26.0–34.0)
MCHC: 31.9 g/dL (ref 30.0–36.0)
MCV: 98.3 fL (ref 78.0–100.0)
PLATELETS: 177 10*3/uL (ref 150–400)
RBC: 3.51 MIL/uL — AB (ref 3.87–5.11)
RDW: 14.8 % (ref 11.5–15.5)
WBC: 6.4 10*3/uL (ref 4.0–10.5)

## 2016-08-14 LAB — RENAL FUNCTION PANEL
ALBUMIN: 2.9 g/dL — AB (ref 3.5–5.0)
Anion gap: 10 (ref 5–15)
BUN: 40 mg/dL — AB (ref 6–20)
CO2: 24 mmol/L (ref 22–32)
CREATININE: 6.62 mg/dL — AB (ref 0.44–1.00)
Calcium: 9 mg/dL (ref 8.9–10.3)
Chloride: 99 mmol/L — ABNORMAL LOW (ref 101–111)
GFR calc Af Amer: 8 mL/min — ABNORMAL LOW (ref >60)
GFR calc non Af Amer: 7 mL/min — ABNORMAL LOW (ref >60)
GLUCOSE: 101 mg/dL — AB (ref 65–99)
PHOSPHORUS: 6.2 mg/dL — AB (ref 2.5–4.6)
POTASSIUM: 4.3 mmol/L (ref 3.5–5.1)
SODIUM: 133 mmol/L — AB (ref 135–145)

## 2016-08-14 LAB — GLUCOSE, CAPILLARY
GLUCOSE-CAPILLARY: 110 mg/dL — AB (ref 65–99)
GLUCOSE-CAPILLARY: 154 mg/dL — AB (ref 65–99)
GLUCOSE-CAPILLARY: 241 mg/dL — AB (ref 65–99)

## 2016-08-14 MED ORDER — PENTAFLUOROPROP-TETRAFLUOROETH EX AERO: 1.0000 "application " | INHALATION_SPRAY | CUTANEOUS | Status: DC | PRN

## 2016-08-14 MED ORDER — CHLORHEXIDINE GLUCONATE 4 % EX LIQD
CUTANEOUS | Status: AC
  Filled 2016-08-14: qty 15

## 2016-08-14 MED ORDER — MIDAZOLAM HCL 2 MG/2ML IJ SOLN
INTRAMUSCULAR | Status: AC
  Filled 2016-08-14: qty 2

## 2016-08-14 MED ORDER — SODIUM CHLORIDE 0.9 % IV SOLN
915.0000 mg | INTRAVENOUS | Status: DC
  Administered 2016-08-14: 915 mg via INTRAVENOUS
  Filled 2016-08-14 (×2): qty 18.3

## 2016-08-14 MED ORDER — FENTANYL CITRATE (PF) 100 MCG/2ML IJ SOLN
INTRAMUSCULAR | Status: AC
  Filled 2016-08-14: qty 2

## 2016-08-14 MED ORDER — MIDAZOLAM HCL 2 MG/2ML IJ SOLN
INTRAMUSCULAR | Status: AC | PRN
Start: 2016-08-14 — End: 2016-08-14
  Administered 2016-08-14 (×2): 1 mg via INTRAVENOUS

## 2016-08-14 MED ORDER — HEPARIN SODIUM (PORCINE) 1000 UNIT/ML DIALYSIS: 1000.0000 [IU] | INTRAMUSCULAR | Status: DC | PRN

## 2016-08-14 MED ORDER — LIDOCAINE-EPINEPHRINE 1 %-1:200000 IJ SOLN
INTRAMUSCULAR | Status: AC
Start: 2016-08-14 — End: 2016-08-14
  Administered 2016-08-14: 10 mL
  Filled 2016-08-14: qty 30

## 2016-08-14 MED ORDER — DOXERCALCIFEROL 4 MCG/2ML IV SOLN
INTRAVENOUS | Status: AC
  Filled 2016-08-14: qty 4

## 2016-08-14 MED ORDER — SODIUM CHLORIDE 0.9 % IV SOLN: 100.0000 mL | INTRAVENOUS | Status: DC | PRN

## 2016-08-14 MED ORDER — HEPARIN SODIUM (PORCINE) 1000 UNIT/ML DIALYSIS
5500.0000 [IU] | Freq: Once | INTRAMUSCULAR | Status: DC
  Filled 2016-08-14: qty 6

## 2016-08-14 MED ORDER — FENTANYL CITRATE (PF) 100 MCG/2ML IJ SOLN
INTRAMUSCULAR | Status: AC | PRN
  Administered 2016-08-14 (×2): 50 ug via INTRAVENOUS

## 2016-08-14 MED ORDER — ALTEPLASE 2 MG IJ SOLR: 2.0000 mg | Freq: Once | INTRAMUSCULAR | Status: DC | PRN

## 2016-08-14 MED ORDER — LIDOCAINE HCL (PF) 1 % IJ SOLN: 5.0000 mL | INTRAMUSCULAR | Status: DC | PRN

## 2016-08-14 MED ORDER — LIDOCAINE-PRILOCAINE 2.5-2.5 % EX CREA: 1.0000 "application " | TOPICAL_CREAM | CUTANEOUS | Status: DC | PRN

## 2016-08-14 MED ORDER — SEVELAMER CARBONATE 800 MG PO TABS
3200.0000 mg | ORAL_TABLET | Freq: Three times a day (TID) | ORAL | Status: DC
  Administered 2016-08-14 – 2016-08-15 (×4): 3200 mg via ORAL
  Filled 2016-08-14 (×4): qty 4

## 2016-08-14 NOTE — Procedures (Signed)
Left chest PORT catheter removed No complication No blood loss. See complete dictation in [facility].

## 2016-08-14 NOTE — Progress Notes (Signed)
PROGRESS NOTE  [name]  XBJ:[id number] DOB: 07-20-1966  DOA: 08/11/2016 PCP: Harriett Sine, MD   Brief Narrative:  50 year old female with PMH of ESRD on TTS HD (Dr. Lowanda Foster), HTN, DM, morbid obesity with approximately 160 pound intentional weight loss since December 2016, lower extremity lymphedema with recurrent cellulitis-last episode approximately a year ago, MSSE bacteremia, CAD, HLD, OHS, on chronic home oxygen 2 L/m, OSA on nightly CPAP, initially presented to [facility] ED on 8/23 with acute onset of high fevers, generalized body aches, headaches, runny nose and pleuritic anterior chest wall pain. In the ED, met sepsis criteria with fever of 104, leukocytosis of 22,000, chest x-ray negative and CT abdomen without clear reason for her sepsis picture. No sick contacts at home. She was started on IV Zosyn and IV doxycycline (intolerant of IV vancomycin) and transferred to [facility] due to acuity of illness and need for stepdown bed. She states that she does not ambulate at home and moves around with a wheelchair. Last hemodialysis 8/22. Nonsmoker. Has left Port-A-Cath and left arm AV fistula. Makes minimal urine and no dysuria reported. Initially was normotensive but on arrival to [facility] ED, became hypotensive and received IV fluid bolus of about 2.5 L. Nephrology consulted for dialysis needs. Sepsis physiology resolved. ID consulted for recurrent Coag Neg Staph bacteremia. Port A Cath to be removed 8/26 then HD. Possible DC 8/27   Assessment & Plan:   Active Problems:   Lymphedema of lower extremity   Diabetes mellitus (HCC)   Morbid obesity (HCC)   HTN (hypertension)   HLD (hyperlipidemia)   Obesity hypoventilation syndrome (HCC)   Recurrent cellulitis of lower leg   Chronic diastolic CHF (congestive heart failure) (HCC)   ESRD (end stage renal disease) (HCC)   Coagulase negative Staphylococcus bacteremia   Sepsis (HCC)   Numbness and tingling in right hand   Sepsis secondary to  recurrent coagulase-negative staph bacteremia - Chest x-ray without acute findings. CT abdomen: Apparent wall thickening involving the hepatic flexure and ascending colon >less likely mild colitis in the absence of diarrhea. Patient does have mild left-sided abdominal pain and nausea without vomiting.  - Lactate normal. - Blood cultures 2: Coagulase-negative staph-one of these cultures was from Port-A-Cath. - Initially normotensive but became hypotensive and [facility] ED and was bolused with IV fluids and blood pressures improved. - Empirically started on IV vancomycin and doxycycline.  - On 8/25, both blood cultures drawn on admission showed coagulase-negative staph. One of these cultures was from Port-A-Cath. Infectious disease was consulted. She has had recurrent coagulase-negative staph bacteremia. The source is not entirely clear but ID is concerned about it coming from her Port-A-Cath. Recommend removing Port-A-Cath (no longer needs Port-A-Cath). Antibiotics changed to IV daptomycin and plan 14 days of therapy. - Sepsis physiology resolved. - Port A Cath to be removed 8/26 then HD.   ESRD on TTS HD - Nephrology consulted for HD. She underwent hemodialysis on 8/24. For HD today after procedure.  Hypertension - As indicated above was transiently hypotensive. Hold antihypertensives. Hypotension resolved. Consider resuming meds at DC.  Anemia - Slight drop in her hemoglobin likely secondary to acute illness. Stable  Chronic lower extremity lymphedema - No acute findings  Chronic respiratory failure with hypoxia/OSA - Continue nightly CPAP and oxygen supplementation.  Morbid obesity - Patient has intentionally lost 160 pounds since December 2016.  DM type II - States that she was able to come off of diabetic medications after her weight loss.  Right upper  extremity numbness - Unclear etiology. Low index of suspicion for stroke. MRI brain and C-spine without contrast> no acute findings to  explain. Improved.   DVT prophylaxis: Subcutaneous heparin Code Status: Full  Family Communication: Discussed with patient. No family at bedside.  Disposition Plan: DC home when medically stable, possibly in 1-2 days. Admitted to stepdown unit. Transferred to medical floor 8/25. Possible DC home 8/27   Consultants:   Nephrology   Infectious Disease  IR  Procedures:   HD 8/24  Antimicrobials:   IV vancomycin 8/23 > 8/25  IV doxycycline 8/23 > 8/25  IV Daptomycin 8/26>   Subjective: Feels better. No pain reported. RUE numbness now limited to finger tips.  Objective:  Vitals:   08/13/16 1236 08/13/16 1722 08/13/16 2017 08/14/16 0413  BP: 111/65 (!) 121/59 133/69 113/61  Pulse: 70 73 80 65  Resp: _0 Temp: 98 F (36.7 C) 98 F (36.7 C) 99.2 F (37.3 C) 97.5 F (36.4 C)  TempSrc: Oral Oral Oral Oral  SpO2: 99% 94% 97% 100%  Weight:   121.6 kg (268 lb 1.3 oz)   Height:        Intake/Output Summary (Last 24 hours) at 08/14/16 0837 Last data filed at 08/14/16 0414  Gross per 24 hour  Intake              750 ml  Output              300 ml  Net              450 ml   Filed Weights   08/12/16 1845 08/13/16 2017  Weight: 114.3 kg (252 lb) 121.6 kg (268 lb 1.3 oz)    Examination:  General exam: Moderately built and morbidly obese female lying comfortably propped up in bed.  Respiratory system: Clear to auscultation. Respiratory effort normal. left Port-A-Cath site without acute findings  Cardiovascular system: S1 & S2 heard, RRR. No JVD, murmurs, rubs, gallops or clicks.  Gastrointestinal system: Abdomen is nondistended, soft. Mild left mid quadrant tenderness (better than yesterday) without peritoneal signs. No organomegaly or masses felt. Normal bowel sounds heard. Central nervous system: Alert and oriented. No focal neurological deficits. Extremities: Symmetric 5 x 5 power. Bilateral lower extremity chronic lymphedema changes without acute findings.  Left arm AV fistula site without acute findings. Right upper extremity with good grip and no sensory deficits.. Skin: No rashes, lesions or ulcers Psychiatry: Judgement and insight appear normal. Mood & affect appropriate.     Data Reviewed: I have personally reviewed following labs and imaging studies  CBC:  Recent Labs Lab 08/11/16 1831 08/12/16 0650 08/12/16 1345 08/13/16 0335 08/14/16 0549  WBC 22.8* 19.0* 14.3* 9.0 6.4  NEUTROABS 21.1*  --   --   --   --   HGB 12.8 11.0* 10.3* 9.7* 11.0*  HCT 38.4 34.5* 32.6* 31.0* 34.5*  MCV 96.5 96.1 97.3 97.8 98.3  PLT 165 174 167 151 282   Basic Metabolic Panel:  Recent Labs Lab 08/11/16 1831 08/12/16 0650 08/12/16 1345  NA 135 135 136  K 4.4 4.4 4.6  CL 104 102 103  CO2 19* 22 20*  GLUCOSE 147* 123* 105*  BUN 47* 48* 52*  CREATININE 7.21* 7.74* 7.81*  CALCIUM 8.5* 8.3* 8.3*  PHOS  --   --  5.9*   GFR: Estimated Creatinine Clearance: 11.3 mL/min (by C-G formula based on SCr of 7.81 mg/dL). Liver Function Tests:  Recent Labs Lab  08/11/16 1831 08/12/16 0650 08/12/16 1345  AST 27 16  --   ALT 22 15  --   ALKPHOS 136* 91  --   BILITOT 0.9 1.1  --   PROT 7.9 6.2*  --   ALBUMIN 4.2 3.0* 2.9*   No results for input(s): LIPASE, AMYLASE in the last 168 hours. No results for input(s): AMMONIA in the last 168 hours. Coagulation Profile:  Recent Labs Lab 08/13/16 1658  INR 1.15   Cardiac Enzymes:  Recent Labs Lab 08/13/16 1658  CKTOTAL 33*   BNP (last 3 results) No results for input(s): PROBNP in the last 8760 hours. HbA1C: No results for input(s): HGBA1C in the last 72 hours. CBG:  Recent Labs Lab 08/13/16 2012 08/14/16 0718  GLUCAP 170* 110*   Lipid Profile: No results for input(s): CHOL, HDL, LDLCALC, TRIG, CHOLHDL, LDLDIRECT in the last 72 hours. Thyroid Function Tests: No results for input(s): TSH, T4TOTAL, FREET4, T3FREE, THYROIDAB in the last 72 hours. Anemia Panel: No results for input(s):  VITAMINB12, FOLATE, FERRITIN, TIBC, IRON, RETICCTPCT in the last 72 hours.  Sepsis Labs:  Recent Labs Lab 08/11/16 1843 08/11/16 2226  LATICACIDVEN 1.81 1.51    Recent Results (from the past 240 hour(s))  Blood Culture (routine x 2)     Status: None (Preliminary result)   Collection Time: 08/11/16  6:31 PM  Result Value Ref Range Status   Specimen Description PORTA CATH  Final   Special Requests BOTTLES DRAWN AEROBIC AND ANAEROBIC 6CC  Final   Culture  Setup Time   Final    GRAM POSITIVE COCCI Gram Stain Report Called to,Read Back By and Verified With: MILLER,J ON 08/12/16 AT 2150 BY LOY,C PERFORMED AT [facility]    Culture   Final    GRAM POSITIVE COCCI CULTURE REINCUBATED FOR BETTER GROWTH Performed at [facility]    Report Status PENDING  Incomplete  Blood Culture (routine x 2)     Status: None (Preliminary result)   Collection Time: 08/11/16  6:41 PM  Result Value Ref Range Status   Specimen Description BLOOD RIGHT ARM  Final   Special Requests BOTTLES DRAWN AEROBIC AND ANAEROBIC 6CC  Final   Culture  Setup Time   Final    GRAM POSITIVE COCCI Gram Stain Report Called to,Read Back By and Verified With: WORLEY J AT [place] AT [id number] ON [id number] BY FORSYTH K AEROBIC BOTTLE ONLY CRITICAL RESULT CALLED TO, READ BACK BY AND VERIFIED WITH: Colin Rhein PHARMD [id number] 08/13/16 A BROWNING    Culture   Final    GRAM POSITIVE COCCI CULTURE REINCUBATED FOR BETTER GROWTH Performed at [facility]    Report Status PENDING  Incomplete  Blood Culture ID Panel (Reflexed)     Status: Abnormal   Collection Time: 08/11/16  6:41 PM  Result Value Ref Range Status   Enterococcus species NOT DETECTED NOT DETECTED Final   Vancomycin resistance NOT DETECTED NOT DETECTED Final   Listeria monocytogenes NOT DETECTED NOT DETECTED Final   Staphylococcus species DETECTED (A) NOT DETECTED Final    Comment: CRITICAL RESULT CALLED TO, READ BACK BY AND VERIFIED WITH: Colin Rhein PHARMD [id number] 08/13/16 A BROWNING      Staphylococcus aureus NOT DETECTED NOT DETECTED Final   Methicillin resistance DETECTED (A) NOT DETECTED Final    Comment: CRITICAL RESULT CALLED TO, READ BACK BY AND VERIFIED WITH: Colin Rhein PHARMD [id number] 08/13/16 A BROWNING    Streptococcus species NOT DETECTED NOT DETECTED Final   Streptococcus  agalactiae NOT DETECTED NOT DETECTED Final   Streptococcus pneumoniae NOT DETECTED NOT DETECTED Final   Streptococcus pyogenes NOT DETECTED NOT DETECTED Final   Acinetobacter baumannii NOT DETECTED NOT DETECTED Final   Enterobacteriaceae species NOT DETECTED NOT DETECTED Final   Enterobacter cloacae complex NOT DETECTED NOT DETECTED Final   Escherichia coli NOT DETECTED NOT DETECTED Final   Klebsiella oxytoca NOT DETECTED NOT DETECTED Final   Klebsiella pneumoniae NOT DETECTED NOT DETECTED Final   Proteus species NOT DETECTED NOT DETECTED Final   Serratia marcescens NOT DETECTED NOT DETECTED Final   Carbapenem resistance NOT DETECTED NOT DETECTED Final   Haemophilus influenzae NOT DETECTED NOT DETECTED Final   Neisseria meningitidis NOT DETECTED NOT DETECTED Final   Pseudomonas aeruginosa NOT DETECTED NOT DETECTED Final   Candida albicans NOT DETECTED NOT DETECTED Final   Candida glabrata NOT DETECTED NOT DETECTED Final   Candida krusei NOT DETECTED NOT DETECTED Final   Candida parapsilosis NOT DETECTED NOT DETECTED Final   Candida tropicalis NOT DETECTED NOT DETECTED Final    Comment: Performed at [facility]  MRSA PCR Screening     Status: None   Collection Time: 08/12/16  6:58 PM  Result Value Ref Range Status   MRSA by PCR NEGATIVE NEGATIVE Final    Comment:        The GeneXpert MRSA Assay (FDA approved for NASAL specimens only), is one component of a comprehensive MRSA colonization surveillance program. It is not intended to diagnose MRSA infection nor to guide or monitor treatment for MRSA infections.          Radiology Studies: Mr Brain Wo Contrast  Result  Date: 08/13/2016 CLINICAL DATA:  Numbness and tingling in the hands.  Headache. EXAM: MRI HEAD WITHOUT CONTRAST TECHNIQUE: Multiplanar, multiecho pulse sequences of the brain and surrounding structures were obtained without intravenous contrast. COMPARISON:  CT 11/13/2011 FINDINGS: There has been previous of occipital craniectomy. Cerebellar tonsils extend down as far as mid C2. There is no evidence of old or acute brain infarction, mass lesion, hemorrhage, hydrocephalus or extra-axial collection. No sign of demyelinating disease. Insignificant at 9 mm choroid plexus cyst at the foramen of Luschka region on the right. No pituitary mass. No significant sinus disease. Major vessels at the base of the brain show flow. IMPRESSION: No acute or significant finding. Previous suboccipital craniectomy for treatment of Chiari malformation. Electronically Signed   By: Nelson Chimes M.D.   On: 08/13/2016 15:13   Mr Cervical Spine Wo Contrast  Result Date: 08/13/2016 CLINICAL DATA:  Numbness and tingling in hands. Dialysis patient. Fever EXAM: MRI CERVICAL SPINE WITHOUT CONTRAST TECHNIQUE: Multiplanar, multisequence MR imaging of the cervical spine was performed. No intravenous contrast was administered. COMPARISON:  MRI head 08/13/2016 FINDINGS: Image quality degraded by moderate motion. Alignment: Normal alignment Vertebrae: Diffusely abnormal bone marrow which is low signal on T1 and T2 compatible with chronic hemodialysis and renal osteodystrophy. No fracture No evidence of discitis/ osteomyelitis in the cervical spine. Cord: Focal fluid collection within the spinal cord at the C3-4 level measuring approximately 3 x 10 mm on sagittal images. This may be a small syrinx related to Chiari malformation. Posterior Fossa, vertebral arteries, paraspinal tissues: Prior Chiari decompression. There is volume loss and hyperintensity in the left cerebellar tonsil compatible with prior chronic infarction. Disc levels: C2-3:  Negative  C3-4:  Small central disc protrusion without spinal stenosis C4-5:  Small central disc protrusion without spinal stenosis C5-6:  Small central disc protrusion without  spinal stenosis C6-7:  Negative C7-T1:  Negative IMPRESSION: No evidence of spondylitis in the cervical spine. Renal osteodystrophy Chiari malformation with chronic infarction left cerebellar tonsil. Small syrinx in the cord at the C3-4 level. Electronically Signed   By: Franchot Gallo M.D.   On: 08/13/2016 16:03        Scheduled Meds: . DAPTOmycin (CUBICIN)  IV  915 mg Intravenous Q48H  . darbepoetin (ARANESP) injection - DIALYSIS  25 mcg Intravenous Q Thu-HD  . doxercalciferol  5 mcg Intravenous Q T,Th,Sa-HD  . heparin  5,000 Units Subcutaneous Q8H  . mouth rinse  7 mL Mouth Rinse BID  . multivitamin  1 tablet Oral QHS   Continuous Infusions: . sodium chloride 10 mL/hr at 08/13/16 0000     LOS: 3 days    Time spent: 52 minutes    HONGALGI,ANAND, MD [facility] Pager [phone]  If 7PM-7AM, please contact night-coverage [email or web address] Password [facility] 08/14/2016, 8:37 AM

## 2016-08-14 NOTE — Progress Notes (Signed)
  Pharmacy Antibiotic Note  [name] is a 50 y.o. female admitted on 08/11/2016 with sepsis, MRSE bacteremia - source possibly from port-a-cath (ID recommends to remove).  Pharmacy has been consulted per ID for daptomycin dosing - plan 14 days of therapy.   ESRD on HD TTS (on schedule). Previously on doxy IV/Zosyn. Reported intolerance to vancomycin. Afebrile, wbc down to wnl, LA 1.51. Noted patient recently lost 160 lb intentionally - monitor for changes in weight.  Baseline CK = 33  Plan: Change Daptomycin 93m (~822mkg) IV q HD (T Th Sat) Plan 14 days of therapy per ID Baseline CK and weekly Monitor clinical progress, c/s, renal function  Height: _0  (165.1 cm) Weight: 299 lb 13.2 oz (136 kg) IBW/kg (Calculated) : 57  Temp (24hrs), Avg:98.1 F (36.7 C), Min:97.5 F (36.4 C), Max:99.2 F (37.3 C)   Recent Labs Lab 08/11/16 1831 08/11/16 1843 08/11/16 2226 08/12/16 0650 08/12/16 1345 08/13/16 0335 08/14/16 0549  WBC 22.8*  --   --  19.0* 14.3* 9.0 6.4  CREATININE 7.21*  --   --  7.74* 7.81*  --   --   LATICACIDVEN  --  1.81 1.51  --   --   --   --     Estimated Creatinine Clearance: 12.1 mL/min (by C-G formula based on SCr of 7.81 mg/dL).    Allergies  Allergen Reactions  . Contrast Media [Iodinated Diagnostic Agents] Anaphylaxis, Hives and Swelling    Dye for cardiac cath. Tongue swells  . Pineapple Anaphylaxis, Itching and Swelling  . Pneumococcal Vaccines Swelling    Turns skin black, and bodily swelling  . Shellfish Allergy Itching and Swelling    Facial swelling   . Vancomycin Nausea And Vomiting    Infusion "made me feel like I was dying" had to be readmitted to hospital    Antimicrobials this admission: 8/23 Zosyn>>8/25 8/24 Doxy IV>>8/25 8/25 dapto>>  Dose adjustments this admission:   Microbiology results: 8/24 mrsa pcr: neg 8/23 BCx2: 2/2 GPC 8/23 BCID: MRSE  Thank you LiAnette GuarneriPharmD [phone]/26/2017 1:01 PM

## 2016-08-14 NOTE — Progress Notes (Signed)
Patient placed on nasal CPAP and tolerating well. 2l bleed in with mask and patient will call when ready to come off. 2L is home O2 status

## 2016-08-14 NOTE — Sedation Documentation (Signed)
Patient denies pain and is resting comfortably.

## 2016-08-14 NOTE — Progress Notes (Signed)
Patient refused CPAP for tonight and will call if she changes her ind

## 2016-08-14 NOTE — Progress Notes (Signed)
[facility] Progress Note  Assessment/Plan: 1. MRSE bacteremia - prior bacteremia in June was MSSE; Pt has Vanc alllergy - on dapto - have called her home dialysis unit ([facility]) and advised them of the need to order daptomycin starting Tuesday for 14 days - need clarification of exact dosing as she will be getting it q HD not every 48 hours (Sat dose will be higher); Port  Removed by IR 2. ESRD - TTS on HD labs pending 3. Anemia - hgb 11- Aranesp 25 given 8/24 4. Secondary hyperparathyroidism - resume renvela P 5.9 5. HTN/volume - titrate EDW BP controlled - goal set for 5.5 L - not sure that she can get that much off but that is what she wants weights here highly variable and below outpt EDW; -outpt orders from [facility] say EDW is 132 - she says she is getting to 129 kg. 6. Nutrition - renal diet 7. Disp - anticipate d/c tomorrow  Myriam Jacobson, PA-C [place] [phone] 08/14/2016,10:33 AM  LOS: 3 days   Pt seen, examined and agree w A/P as above.  Kelly Splinter MD [place] pager [phone]    cell [phone] 08/14/2016, 11:33 AM   Subjective:   Feeling better drowsy from meds given in IR. She feels her volume is up.    Objective Vitals:   08/14/16 0951 08/14/16 1000 08/14/16 1005 08/14/16 1023  BP: 128/72 129/77 122/72 127/77  Pulse: 70 70 72 73  Resp: _0 Temp:      TempSrc:      SpO2: 96% 96% 96% 96%  Weight:      Height:       Physical Exam General: NAD Heart: RRR (dressing on chest where port was removed) Lungs: no rales Abdomen: obese soft  Extremities:chronic lymphedema, edema not sig Dialysis Access: left lower AVF + bruit  Dialysis Orders: [facility]    4h 100mn  132kg  Hep 2000 then 1000/ hr  LFA AVF EPO 2000  Hect 5  Venofer 50/wk  Additional Objective Labs: Basic Metabolic Panel:  Recent Labs Lab 08/11/16 1831 08/12/16 0650 08/12/16 1345  NA 135 135 136  K 4.4  4.4 4.6  CL 104 102 103  CO2 19* 22 20*  GLUCOSE 147* 123* 105*  BUN 47* 48* 52*  CREATININE 7.21* 7.74* 7.81*  CALCIUM 8.5* 8.3* 8.3*  PHOS  --   --  5.9*   Liver Function Tests:  Recent Labs Lab 08/11/16 1831 08/12/16 0650 08/12/16 1345  AST 27 16  --   ALT 22 15  --   ALKPHOS 136* 91  --   BILITOT 0.9 1.1  --   PROT 7.9 6.2*  --   ALBUMIN 4.2 3.0* 2.9*  CBC:  Recent Labs Lab 08/11/16 1831 08/12/16 0650 08/12/16 1345 08/13/16 0335 08/14/16 0549  WBC 22.8* 19.0* 14.3* 9.0 6.4  NEUTROABS 21.1*  --   --   --   --   HGB 12.8 11.0* 10.3* 9.7* 11.0*  HCT 38.4 34.5* 32.6* 31.0* 34.5*  MCV 96.5 96.1 97.3 97.8 98.3  PLT 165 174 167 151 177   Blood Culture    Component Value Date/Time   SDES BLOOD RIGHT ARM 08/11/2016 1841   SPECREQUEST BOTTLES DRAWN AEROBIC AND ANAEROBIC 6CC 08/11/2016 1841   CULT STAPHYLOCOCCUS SPECIES (COAGULASE NEGATIVE) (A) 08/11/2016 1841   REPTSTATUS PENDING 08/11/2016 1841    Cardiac Enzymes:  Recent Labs Lab 08/13/16 1658  CKTOTAL 33*   CBG:  Recent Labs Lab 08/13/16 2012 08/14/16 0718  GLUCAP 170* 110*   Iron Studies: No results for input(s): IRON, TIBC, TRANSFERRIN, FERRITIN in the last 72 hours. Lab Results  Component Value Date   INR 1.15 08/13/2016   INR 1.37 11/29/2015   INR 1.28 11/27/2015   Studies/Results: Mr Brain Wo Contrast  Result Date: 08/13/2016 CLINICAL DATA:  Numbness and tingling in the hands.  Headache. EXAM: MRI HEAD WITHOUT CONTRAST TECHNIQUE: Multiplanar, multiecho pulse sequences of the brain and surrounding structures were obtained without intravenous contrast. COMPARISON:  CT 11/13/2011 FINDINGS: There has been previous of occipital craniectomy. Cerebellar tonsils extend down as far as mid C2. There is no evidence of old or acute brain infarction, mass lesion, hemorrhage, hydrocephalus or extra-axial collection. No sign of demyelinating disease. Insignificant at 9 mm choroid plexus cyst at the foramen  of [place]ch[place] region on the right. No pituitary mass. No significant sinus disease. Major vessels at the base of the brain show flow. IMPRESSION: No acute or significant finding. Previous suboccipital craniectomy for treatment of Chiari malformation. Electronically Signed   By: Nelson Chimes M.D.   On: 08/13/2016 15:13   Mr Cervical Spine Wo Contrast  Result Date: 08/13/2016 CLINICAL DATA:  Numbness and tingling in hands. Dialysis patient. Fever EXAM: MRI CERVICAL SPINE WITHOUT CONTRAST TECHNIQUE: Multiplanar, multisequence MR imaging of the cervical spine was performed. No intravenous contrast was administered. COMPARISON:  MRI head 08/13/2016 FINDINGS: Image quality degraded by moderate motion. Alignment: Normal alignment Vertebrae: Diffusely abnormal bone marrow which is low signal on T1 and T2 compatible with chronic hemodialysis and renal osteodystrophy. No fracture No evidence of discitis/ osteomyelitis in the cervical spine. Cord: Focal fluid collection within the spinal cord at the C3-4 level measuring approximately 3 x 10 mm on sagittal images. This may be a small syrinx related to Chiari malformation. Posterior Fossa, vertebral arteries, paraspinal tissues: Prior Chiari decompression. There is volume loss and hyperintensity in the left cerebellar tonsil compatible with prior chronic infarction. Disc levels: C2-3:  Negative C3-4:  Small central disc protrusion without spinal stenosis C4-5:  Small central disc protrusion without spinal stenosis C5-6:  Small central disc protrusion without spinal stenosis C6-7:  Negative C7-T1:  Negative IMPRESSION: No evidence of spondylitis in the cervical spine. Renal osteodystrophy Chiari malformation with chronic infarction left cerebellar tonsil. Small syrinx in the cord at the C3-4 level. Electronically Signed   By: Franchot Gallo M.D.   On: 08/13/2016 16:03   Ir Removal Tun Cv Cath W/o Fl  Result Date: 08/14/2016 CLINICAL DATA:  Suspected line infection EXAM: EXAM  TUNNELED PORT CATHETER REMOVAL TECHNIQUE: The procedure, risks (including but not limited to bleeding, infection, organ damage ), benefits, and alternatives were explained to the patient. Questions regarding the procedure were encouraged and answered. The patient understands and consents to the procedure. Intravenous Fentanyl and Versed were administered as conscious sedation during continuous monitoring of the patient's level of consciousness and physiological / cardiorespiratory status by the radiology RN, with a total moderate sedation time of 15 minutes. Overlying skin prepped with chlorhexidine, draped in usual sterile fashion, infiltrated locally with 1% lidocaine. A small incision was made over the scar from previous placement. The port catheter was dissected free from the underlying soft tissues and removed intact. Hemostasis was achieved. The port pocket was closed with deep interrupted and subcuticular continuous 3-0 Monocryl sutures, then covered with Dermabond. The patient tolerated the procedure well. COMPLICATIONS: COMPLICATIONS None immediate  IMPRESSION: 1.  Technically successful tunneled Port catheter removal. Electronically Signed   By: Lucrezia Europe M.D.   On: 08/14/2016 10:19   Medications: . sodium chloride 10 mL/hr at 08/13/16 0000   . DAPTOmycin (CUBICIN)  IV  915 mg Intravenous Q48H  . darbepoetin (ARANESP) injection - DIALYSIS  25 mcg Intravenous Q Thu-HD  . doxercalciferol  5 mcg Intravenous Q T,Th,Sa-HD  . fentaNYL      . heparin  5,000 Units Subcutaneous Q8H  . mouth rinse  7 mL Mouth Rinse BID  . midazolam      . multivitamin  1 tablet Oral QHS

## 2016-08-14 NOTE — Sedation Documentation (Signed)
Patient denies pain and is resting comfortably.  

## 2016-08-15 ENCOUNTER — Encounter (HOSPITAL_COMMUNITY): Payer: Self-pay

## 2016-08-15 LAB — GLUCOSE, CAPILLARY
GLUCOSE-CAPILLARY: 142 mg/dL — AB (ref 65–99)
GLUCOSE-CAPILLARY: 133 mg/dL — AB (ref 65–99)
Glucose-Capillary: 107 mg/dL — ABNORMAL HIGH (ref 65–99)

## 2016-08-15 LAB — CULTURE, BLOOD (ROUTINE X 2)

## 2016-08-15 MED ORDER — SODIUM CHLORIDE 0.9 % IV SOLN: 1000.0000 mg | INTRAVENOUS | Status: DC

## 2016-08-15 MED ORDER — SODIUM CHLORIDE 0.9 % IV SOLN: 1000.0000 mg | INTRAVENOUS | 0 refills | Status: DC

## 2016-08-15 NOTE — Discharge Summary (Addendum)
Physician Discharge Summary  [name]:[id number] DOB: 04-18-1966  PCP: Alonza Bogus, MD  Admit date: 08/11/2016 Discharge date: 08/15/2016  Admitted From: Home Disposition:  Home  Recommendations for Outpatient Follow-up:  1. Dr. [name], PCP in 5 days 2. Dr. Fran Lowes, Nephrology: Continue hemodialysis on Tuesdays, Thursdays and Saturdays. 3. IV daptomycin post dialysis 14 days. Start date: 08/13/16. This has been arranged prior to discharge by nephrology service with patient's outpatient dialysis unit. Patient will need lab monitoring-please see details below.  Home Health: Resume home health PT. RN. Equipment/Devices: None    Discharge Condition: Improved and stable.  CODE STATUS: Full  Diet recommendation: Heart healthy diet.  Discharge Diagnoses:  Active Problems:   Lymphedema of lower extremity   Diabetes mellitus (HCC)   Morbid obesity (HCC)   HTN (hypertension)   HLD (hyperlipidemia)   Obesity hypoventilation syndrome (HCC)   Recurrent cellulitis of lower leg   Chronic diastolic CHF (congestive heart failure) (HCC)   ESRD (end stage renal disease) (HCC)   Coagulase negative Staphylococcus bacteremia   Sepsis (HCC)   Numbness and tingling in right hand   Brief/Interim Summary: Brief Narrative:  50 year old female with PMH of ESRD on TTS HD (Dr. Lowanda Foster), HTN, DM, morbid obesity with approximately 160 pound intentional weight loss since December 2016, lower extremity lymphedema with recurrent cellulitis-last episode approximately a year ago, MSSE bacteremia, CAD, HLD, OHS, on chronic home oxygen 2 L/m, OSA on nightly CPAP, initially presented to [facility] ED on 8/23 with acute onset of high fevers, generalized body aches, headaches, runny nose and pleuritic anterior chest wall pain. In the ED, met sepsis criteria with fever of 104, leukocytosis of 22,000, chest x-ray negative and CT abdomen without clear reason for her sepsis picture. No sick  contacts at home. She was started on IV Zosyn and IV doxycycline (intolerant of IV vancomycin) and transferred to [facility] due to acuity of illness and need for stepdown bed. She states that she does not ambulate at home and moves around with a wheelchair. Last hemodialysis 8/22. Nonsmoker. Has left Port-A-Cath and left arm AV fistula. Makes minimal urine and no dysuria reported. Initially was normotensive but on arrival to [facility] ED, became hypotensive and received IV fluid bolus of about 2.5 L. Nephrology consulted for dialysis needs. Sepsis physiology resolved. ID consulted for recurrent Coag Neg Staph bacteremia. Port A Cath to be removed 8/26 then HD. Possible DC 8/27   Assessment & Plan:   Sepsis secondary to recurrent coagulase-negative staph bacteremia - Chest x-ray without acute findings. CT abdomen: Apparent wall thickening involving the hepatic flexure and ascending colon >less likely mild colitis in the absence of diarrhea. Patient does have mild left-sided abdominal pain and nausea without vomiting.  - Lactate normal. - Blood cultures 2: Coagulase-negative staph-one of these cultures was from Port-A-Cath. - Initially normotensive but became hypotensive and [facility] ED and was bolused with IV fluids and blood pressures improved. - Empirically started on IV vancomycin and doxycycline.  - On 8/25, both blood cultures drawn on admission showed coagulase-negative staph. One of these cultures was from Port-A-Cath. Infectious disease was consulted. She has had recurrent coagulase-negative staph bacteremia. The source is not entirely clear but ID is concerned about it coming from her Port-A-Cath. Recommend removing Port-A-Cath (no longer needs Port-A-Cath). Antibiotics changed to IV daptomycin and plan 14 days of therapy. - Sepsis physiology resolved. - Port A Cath removed by IR on 8/26. Had low-grade fever off 99.57F yesterday evening. Discussed with  infectious disease M.D. on call today, no need for  surveillance blood cultures. - Discussed with Dr. Jonnie Finner and Ms. Reinaldo Meeker, Nephrology regarding planned discharge for today, they have arranged for outpatient IV antibiotics postdialysis at patient's primary dialysis Center. Recommend following labs as per pharmacy recommendation below.  Pharmacy recommendation: Adjust Daptomycin to 1000 mg (~40m/kg) IV q HD (T Th Sat) Plan 14 days of therapy per ID Baseline CK and weekly Monitor clinical progress, c/s, renal function  ESRD on TTS HD - Nephrology consulted for HD. She underwent hemodialysis on 8/24 & 8/26 after Port-A-Cath was removed.   Hypertension - As indicated above was transiently hypotensive.  - Antihypertensives were held in the hospital. Hypotension has resolved. Resume home antihypertensives at discharge.  Anemia - Slight drop in her hemoglobin likely secondary to acute illness. Stable  Chronic lower extremity lymphedema - No acute findings  Chronic respiratory failure with hypoxia/OSA - Continue nightly CPAP and oxygen supplementation.  Morbid obesity/Body mass index is 49.56 kg/m. - Patient has intentionally lost 160 pounds since December 2016.  DM type II - States that she was able to come off of diabetic medications after her weight loss. - CBGs reasonably controlled off medications in the hospital.  Right upper extremity numbness - Unclear etiology. Low index of suspicion for stroke. MRI brain and C-spine without contrast> no acute findings to explain. Resolved.. Small syrinx noted at C3-4 levels. If has recurrent symptoms, consider outpatient neurosurgery consultation.   I offered to discuss with family and update her care but she respectfully declined.   Consultants:   Nephrology   Infectious Disease  IR  Procedures:   HD    Discharge Instructions  Discharge Instructions    Call MD for:  difficulty breathing, headache or visual disturbances    Complete by:  As directed   Call MD  for:  extreme fatigue    Complete by:  As directed   Call MD for:  persistant dizziness or light-headedness    Complete by:  As directed   Call MD for:  persistant nausea and vomiting    Complete by:  As directed   Call MD for:  redness, tenderness, or signs of infection (pain, swelling, redness, odor or green/yellow discharge around incision site)    Complete by:  As directed   Call MD for:  severe uncontrolled pain    Complete by:  As directed   Call MD for:  temperature >100.4    Complete by:  As directed   Diet - low sodium heart healthy    Complete by:  As directed   Increase activity slowly    Complete by:  As directed       Medication List    TAKE these medications   acetaminophen 325 MG tablet Commonly known as:  TYLENOL Take 650 mg by mouth every 6 (six) hours as needed for moderate pain or fever.   albuterol (2.5 MG/3ML) 0.083% nebulizer solution Commonly known as:  PROVENTIL Take 2.5 mg by nebulization every 6 (six) hours as needed for wheezing or shortness of breath.   ALPRAZolam 0.5 MG tablet Commonly known as:  XANAX Take 0.5 mg by mouth 3 (three) times daily as needed for anxiety or sleep.   amLODipine 10 MG tablet Commonly known as:  NORVASC Take 0.5 tablets (5 mg total) by mouth daily.   DAPTOmycin 1,000 mg in sodium chloride 0.9 % 100 mL Inject 1,000 mg into the vein every Tuesday, Thursday, and Saturday at 6 PM.  To be given after hemodialysis. Total duration: 14 days. Start date: 08/13/16. Start taking on:  08/17/2016   diphenhydrAMINE 25 mg capsule Commonly known as:  BENADRYL Take 25 mg by mouth every 6 (six) hours as needed for allergies.   metoprolol 50 MG tablet Commonly known as:  LOPRESSOR Take 0.5 tablets (25 mg total) by mouth 2 (two) times daily.   multivitamin Tabs tablet Take 1 tablet by mouth daily.   ondansetron 4 MG disintegrating tablet Commonly known as:  ZOFRAN-ODT Take 4 mg by mouth every 8 (eight) hours as needed for nausea or  vomiting.   Oxycodone HCl 10 MG Tabs Take 1 tablet (10 mg total) by mouth 3 (three) times daily as needed (pain).   sevelamer carbonate 800 MG tablet Commonly known as:  RENVELA Take 1,600-2,700 mg by mouth 3 (three) times daily with meals. Takes 3 tablets with meals and 2 with snacks      Follow-up Information    [facility] .   Why:  HHRN,HHPT Contact information: [place] [address] [phone]        Alonza Bogus, MD. Schedule an appointment as soon as possible for a visit in 5 day(s).   Specialty:  Pulmonary Disease Contact information: [address] [address] [phone]        [facility], MD .   Specialty:  Nephrology Why:  Follow-up regarding your regular hemodialysis on Tuesdays, Thursdays and Saturdays. You should be getting IV antibiotics after each dialysis for 14 days. Contact information: [place] [address] [phone]          Allergies  Allergen Reactions  . Contrast Media [Iodinated Diagnostic Agents] Anaphylaxis, Hives and Swelling    Dye for cardiac cath. Tongue swells  . Pineapple Anaphylaxis, Itching and Swelling  . Pneumococcal Vaccines Swelling    Turns skin black, and bodily swelling  . Shellfish Allergy Itching and Swelling    Facial swelling   . Vancomycin Nausea And Vomiting    Infusion "made me feel like I was dying" had to be readmitted to hospital    Procedures/Studies: Ct Abdomen Pelvis Wo Contrast  Result Date: 08/12/2016 CLINICAL DATA:  Abdominal pain and fever. EXAM: CT ABDOMEN AND PELVIS WITHOUT CONTRAST TECHNIQUE: Multidetector CT imaging of the abdomen and pelvis was performed following the standard protocol without IV contrast. COMPARISON:  Multiple priors most recent CT 06/06/2016. CT 12/02/2014 FINDINGS: Lower chest:  Linear bibasilar atelectasis.  No pleural fluid. Liver: Steatosis.  Prominent in size. Hepatobiliary:  Previous focus of air in the gallbladder is no longer seen. Gallbladder appears contracted and is not well visualized. Pancreas: No ductal dilatation or inflammation. Spleen: Normal. Adrenal glands: No nodule. Kidneys: No hydronephrosis. Mild nonspecific perinephric edema is unchanged. No urolithiasis. Stomach/Bowel: Stomach physiologically distended. There are no dilated or thickened small bowel loops. Colonic wall thickening versus peristalsis involving the hepatic flexure and ascending colon. No adjacent pericolonic edema. Multifocal colonic diverticulosis without acute diverticulitis. The appendix is normal. Vascular/Lymphatic: Multiple retroperitoneal lymph nodes which have been present dating back to 2014, many of which are stable, some of which have decreased in size. Enlarged left external iliac and bilateral inguinal lymph nodes are also unchanged. No new or progressive adenopathy. Abdominal aorta is normal in caliber. Mild atherosclerosis without aneurysm. Reproductive: Enlarged right ovary measuring 5.5 cm, unchanged. Uterus appears surgically absent. Bladder: Minimally distended, no wall thickening. Other: No free air, free fluid, or intra-abdominal  fluid collection. Fat containing umbilical hernia. Musculoskeletal: There are no acute or suspicious osseous abnormalities. Degenerative change in the spine with primarily facet arthropathy. IMPRESSION: 1. Apparent wall thickening involving the hepatic flexure and ascending colon which may be due to peristalsis, decompressed colon, or less likely mild colitis. There is no adjacent inflammation. 2. Retroperitoneal and pelvic adenopathy which is chronic, stable or mildly improved dating back to 2014, suggesting reactive etiology. 3. Chronic findings include colonic diverticulosis without diverticulitis, hepatic steatosis, and chronically enlarged right ovary. Electronically Signed   By: Jeb Levering M.D.   On: 08/12/2016 03:22   Dg Chest 2 View  Result  Date: 08/11/2016 CLINICAL DATA:  Chest congestion with headache and chills starting this morning. EXAM: CHEST  2 VIEW COMPARISON:  06/06/2016 FINDINGS: The lungs are clear wiithout focal pneumonia, edema, pneumothorax or pleural effusion. Left Port-A-Cath tip overlies the distal SVC. The cardio pericardial silhouette is enlarged. The visualized bony structures of the thorax are intact. IMPRESSION: No acute cardiopulmonary findings. Electronically Signed   By: Misty Stanley M.D.   On: 08/11/2016 15:47   Mr Brain Wo Contrast  Result Date: 08/13/2016 CLINICAL DATA:  Numbness and tingling in the hands.  Headache. EXAM: MRI HEAD WITHOUT CONTRAST TECHNIQUE: Multiplanar, multiecho pulse sequences of the brain and surrounding structures were obtained without intravenous contrast. COMPARISON:  CT 11/13/2011 FINDINGS: There has been previous of occipital craniectomy. Cerebellar tonsils extend down as far as mid C2. There is no evidence of old or acute brain infarction, mass lesion, hemorrhage, hydrocephalus or extra-axial collection. No sign of demyelinating disease. Insignificant at 9 mm choroid plexus cyst at the foramen of Luschka region on the right. No pituitary mass. No significant sinus disease. Major vessels at the base of the brain show flow. IMPRESSION: No acute or significant finding. Previous suboccipital craniectomy for treatment of Chiari malformation. Electronically Signed   By: Nelson Chimes M.D.   On: 08/13/2016 15:13   Mr Cervical Spine Wo Contrast  Result Date: 08/13/2016 CLINICAL DATA:  Numbness and tingling in hands. Dialysis patient. Fever EXAM: MRI CERVICAL SPINE WITHOUT CONTRAST TECHNIQUE: Multiplanar, multisequence MR imaging of the cervical spine was performed. No intravenous contrast was administered. COMPARISON:  MRI head 08/13/2016 FINDINGS: Image quality degraded by moderate motion. Alignment: Normal alignment Vertebrae: Diffusely abnormal bone marrow which is low signal on T1 and T2  compatible with chronic hemodialysis and renal osteodystrophy. No fracture No evidence of discitis/ osteomyelitis in the cervical spine. Cord: Focal fluid collection within the spinal cord at the C3-4 level measuring approximately 3 x 10 mm on sagittal images. This may be a small syrinx related to Chiari malformation. Posterior Fossa, vertebral arteries, paraspinal tissues: Prior Chiari decompression. There is volume loss and hyperintensity in the left cerebellar tonsil compatible with prior chronic infarction. Disc levels: C2-3:  Negative C3-4:  Small central disc protrusion without spinal stenosis C4-5:  Small central disc protrusion without spinal stenosis C5-6:  Small central disc protrusion without spinal stenosis C6-7:  Negative C7-T1:  Negative IMPRESSION: No evidence of spondylitis in the cervical spine. Renal osteodystrophy Chiari malformation with chronic infarction left cerebellar tonsil. Small syrinx in the cord at the C3-4 level. Electronically Signed   By: Franchot Gallo M.D.   On: 08/13/2016 16:03   Ir Removal Tun Cv Cath W/o Fl  Result Date: 08/14/2016 CLINICAL DATA:  Suspected line infection EXAM: EXAM TUNNELED PORT CATHETER REMOVAL TECHNIQUE: The procedure, risks (including but not limited to bleeding, infection, organ damage ), benefits,  and alternatives were explained to the patient. Questions regarding the procedure were encouraged and answered. The patient understands and consents to the procedure. Intravenous Fentanyl and Versed were administered as conscious sedation during continuous monitoring of the patient's level of consciousness and physiological / cardiorespiratory status by the radiology RN, with a total moderate sedation time of 15 minutes. Overlying skin prepped with chlorhexidine, draped in usual sterile fashion, infiltrated locally with 1% lidocaine. A small incision was made over the scar from previous placement. The port catheter was dissected free from the underlying soft  tissues and removed intact. Hemostasis was achieved. The port pocket was closed with deep interrupted and subcuticular continuous 3-0 Monocryl sutures, then covered with Dermabond. The patient tolerated the procedure well. COMPLICATIONS: COMPLICATIONS None immediate IMPRESSION: 1.  Technically successful tunneled Port catheter removal. Electronically Signed   By: Lucrezia Europe M.D.   On: 08/14/2016 10:19      Subjective: Denies complaints. No fever, pain or numbness.  Discharge Exam:  Vitals:   08/14/16 2007 08/15/16 0407 08/15/16 0841 08/15/16 0955  BP: (!) 102/53 (!) 103/52  118/61  Pulse: 80 65  72  Resp: _0 Temp: 99.5 F (37.5 C) 97.9 F (36.6 C)  99.1 F (37.3 C)  TempSrc: Axillary Oral  Oral  SpO2: 98% 100%  100%  Weight: 135.1 kg (297 lb 13.5 oz)  135.1 kg (297 lb 13.5 oz)   Height:        General exam: Moderately built and morbidly obese female lying comfortably propped up in bed.  Respiratory system: Clear to auscultation. Respiratory effort normal.   Cardiovascular system: S1 & S2 heard, RRR. No JVD, murmurs, rubs, gallops or clicks.  Gastrointestinal system: Abdomen is nondistended, soft & non tender. No organomegaly or masses felt. Normal bowel sounds heard. Central nervous system: Alert and oriented. No focal neurological deficits. Extremities: Symmetric 5 x 5 power. Bilateral lower extremity chronic lymphedema changes without acute findings. Left arm AV fistula site without acute findings. Right upper extremity with good grip and no sensory deficits.. Skin: No rashes, lesions or ulcers Psychiatry: Judgement and insight appear normal. Mood & affect appropriate.     The results of significant diagnostics from this hospitalization (including imaging, microbiology, ancillary and laboratory) are listed below for reference.     Microbiology: Recent Results (from the past 240 hour(s))  Blood Culture (routine x 2)     Status: Abnormal   Collection Time: 08/11/16   6:31 PM  Result Value Ref Range Status   Specimen Description PORTA CATH  Final   Special Requests BOTTLES DRAWN AEROBIC AND ANAEROBIC 6CC  Final   Culture  Setup Time   Final    GRAM POSITIVE COCCI Gram Stain Report Called to,Read Back By and Verified With: MILLER,J ON 08/12/16 AT 2150 BY LOY,C PERFORMED AT [facility]    Culture STAPHYLOCOCCUS SPECIES (COAGULASE NEGATIVE) (A)  Final   Report Status 08/15/2016 FINAL  Final   Organism ID, Bacteria STAPHYLOCOCCUS SPECIES (COAGULASE NEGATIVE)  Final      Susceptibility   Staphylococcus species (coagulase negative) - MIC*    CIPROFLOXACIN >=8 RESISTANT Resistant     ERYTHROMYCIN >=8 RESISTANT Resistant     GENTAMICIN 8 INTERMEDIATE Intermediate     OXACILLIN >=4 RESISTANT Resistant     TETRACYCLINE 2 SENSITIVE Sensitive     VANCOMYCIN 1 SENSITIVE Sensitive     TRIMETH/SULFA 160 RESISTANT Resistant     CLINDAMYCIN >=8 RESISTANT Resistant     RIFAMPIN <=  0.5 SENSITIVE Sensitive     Inducible Clindamycin NEGATIVE Sensitive     * STAPHYLOCOCCUS SPECIES (COAGULASE NEGATIVE)  Blood Culture (routine x 2)     Status: Abnormal   Collection Time: 08/11/16  6:41 PM  Result Value Ref Range Status   Specimen Description BLOOD RIGHT ARM  Final   Special Requests BOTTLES DRAWN AEROBIC AND ANAEROBIC 6CC  Final   Culture  Setup Time   Final    GRAM POSITIVE COCCI Gram Stain Report Called to,Read Back By and Verified With: WORLEY J AT [place] AT [id number] ON [id number] BY FORSYTH K AEROBIC BOTTLE ONLY CRITICAL RESULT CALLED TO, READ BACK BY AND VERIFIED WITH: Colin Rhein PHARMD [id number] 08/13/16 A BROWNING    Culture (A)  Final    STAPHYLOCOCCUS SPECIES (COAGULASE NEGATIVE) SUSCEPTIBILITIES PERFORMED ON PREVIOUS CULTURE WITHIN THE LAST 5 DAYS. Performed at [facility]    Report Status 08/15/2016 FINAL  Final  Blood Culture ID Panel (Reflexed)     Status: Abnormal   Collection Time: 08/11/16  6:41 PM  Result Value Ref Range Status   Enterococcus species NOT DETECTED  NOT DETECTED Final   Vancomycin resistance NOT DETECTED NOT DETECTED Final   Listeria monocytogenes NOT DETECTED NOT DETECTED Final   Staphylococcus species DETECTED (A) NOT DETECTED Final    Comment: CRITICAL RESULT CALLED TO, READ BACK BY AND VERIFIED WITH: Colin Rhein PHARMD [id number] 08/13/16 A BROWNING    Staphylococcus aureus NOT DETECTED NOT DETECTED Final   Methicillin resistance DETECTED (A) NOT DETECTED Final    Comment: CRITICAL RESULT CALLED TO, READ BACK BY AND VERIFIED WITH: Colin Rhein PHARMD [id number] 08/13/16 A BROWNING    Streptococcus species NOT DETECTED NOT DETECTED Final   Streptococcus agalactiae NOT DETECTED NOT DETECTED Final   Streptococcus pneumoniae NOT DETECTED NOT DETECTED Final   Streptococcus pyogenes NOT DETECTED NOT DETECTED Final   Acinetobacter baumannii NOT DETECTED NOT DETECTED Final   Enterobacteriaceae species NOT DETECTED NOT DETECTED Final   Enterobacter cloacae complex NOT DETECTED NOT DETECTED Final   Escherichia coli NOT DETECTED NOT DETECTED Final   Klebsiella oxytoca NOT DETECTED NOT DETECTED Final   Klebsiella pneumoniae NOT DETECTED NOT DETECTED Final   Proteus species NOT DETECTED NOT DETECTED Final   Serratia marcescens NOT DETECTED NOT DETECTED Final   Carbapenem resistance NOT DETECTED NOT DETECTED Final   Haemophilus influenzae NOT DETECTED NOT DETECTED Final   Neisseria meningitidis NOT DETECTED NOT DETECTED Final   Pseudomonas aeruginosa NOT DETECTED NOT DETECTED Final   Candida albicans NOT DETECTED NOT DETECTED Final   Candida glabrata NOT DETECTED NOT DETECTED Final   Candida krusei NOT DETECTED NOT DETECTED Final   Candida parapsilosis NOT DETECTED NOT DETECTED Final   Candida tropicalis NOT DETECTED NOT DETECTED Final    Comment: Performed at [facility]  MRSA PCR Screening     Status: None   Collection Time: 08/12/16  6:58 PM  Result Value Ref Range Status   MRSA by PCR NEGATIVE NEGATIVE Final    Comment:        The GeneXpert MRSA  Assay (FDA approved for NASAL specimens only), is one component of a comprehensive MRSA colonization surveillance program. It is not intended to diagnose MRSA infection nor to guide or monitor treatment for MRSA infections.      Labs: BNP (last 3 results)  Recent Labs  11/13/15 1500 04/07/16 1715  BNP 315.0* 09.3   Basic Metabolic Panel:  Recent Labs Lab 08/11/16 1831 08/12/16  0931 08/12/16 1345 08/14/16 1100  NA 135 135 136 133*  K 4.4 4.4 4.6 4.3  CL 104 102 103 99*  CO2 19* 22 20* 24  GLUCOSE 147* 123* 105* 101*  BUN 47* 48* 52* 40*  CREATININE 7.21* 7.74* 7.81* 6.62*  CALCIUM 8.5* 8.3* 8.3* 9.0  PHOS  --   --  5.9* 6.2*   Liver Function Tests:  Recent Labs Lab 08/11/16 1831 08/12/16 0650 08/12/16 1345 08/14/16 1100  AST 27 16  --   --   ALT 22 15  --   --   ALKPHOS 136* 91  --   --   BILITOT 0.9 1.1  --   --   PROT 7.9 6.2*  --   --   ALBUMIN 4.2 3.0* 2.9* 2.9*   No results for input(s): LIPASE, AMYLASE in the last 168 hours. No results for input(s): AMMONIA in the last 168 hours. CBC:  Recent Labs Lab 08/11/16 1831 08/12/16 0650 08/12/16 1345 08/13/16 0335 08/14/16 0549  WBC 22.8* 19.0* 14.3* 9.0 6.4  NEUTROABS 21.1*  --   --   --   --   HGB 12.8 11.0* 10.3* 9.7* 11.0*  HCT 38.4 34.5* 32.6* 31.0* 34.5*  MCV 96.5 96.1 97.3 97.8 98.3  PLT 165 174 167 151 177   Cardiac Enzymes:  Recent Labs Lab 08/13/16 1658  CKTOTAL 33*   BNP: Invalid input(s): POCBNP CBG:  Recent Labs Lab 08/14/16 0718 08/14/16 1632 08/14/16 2010 08/15/16 0750 08/15/16 1158  GLUCAP 110* 154* 241* 107* 142*   Urinalysis    Component Value Date/Time   COLORURINE YELLOW 08/11/2016 1800   APPEARANCEUR HAZY (A) 08/11/2016 1800   LABSPEC 1.020 08/11/2016 1800   PHURINE 5.5 08/11/2016 1800   GLUCOSEU NEGATIVE 08/11/2016 1800   HGBUR MODERATE (A) 08/11/2016 1800   BILIRUBINUR NEGATIVE 08/11/2016 1800   KETONESUR NEGATIVE 08/11/2016 1800   PROTEINUR 100  (A) 08/11/2016 1800   UROBILINOGEN 0.2 12/02/2014 1925   NITRITE NEGATIVE 08/11/2016 1800   LEUKOCYTESUR NEGATIVE 08/11/2016 1800      Time coordinating discharge: Over 30 minutes  SIGNED:  Vernell Leep, MD, FACP, [place]. [facility] Pager [phone]  If 7PM-7AM, please contact night-coverage [email or web address] Password [email or web address] 08/15/2016, 3:12 PM

## 2016-08-15 NOTE — Progress Notes (Addendum)
[facility] Progress Note  Assessment/Plan: 1. MRSE bacteremia - prior bacteremia in June was MSSE; Pt has Vanc alllergy - on dapto - have called her home dialysis unit ([facility]) and advised them of the need to order daptomycin starting Tuesday for 14 days - discussed dosing with pharmacy today re: dosing for discharge - initial dosing on dapto based on ED weight of 114kg was false low.  Plan increase dapto dose for d/c 1 gm q HD. She has not had any repeat BC drawn since starting on dapto.  Still had temp up to 99.9 after port removed earlier in the day. Plan repeat blood cultures and consider Echo. 2. ESRD - TTS - next HD Tuesday 3. Anemia - hgb 11- Aranesp 25 given 8/24 4. Secondary hyperparathyroidism - resume renvela P 5.9 5. HTN/volume - UF almost 5.5 L - Saturday. BP lowish - wts still about EDW 6. Nutrition - renal diet alb 2.9 7. Disp -would consider holding on d/c today due to ^ temp last pm  Myriam Jacobson, PA-C [facility] Beeper [phone] 08/15/2016,8:08 AM  LOS: 4 days   Pt seen, examined and agree w A/P as above. D/W primary, not of sure of dc or not today yet.  We will arrange (either way) for outpatient AB to be given at the [facility] unit after dc.   Kelly Splinter MD [place] pager [phone]    cell [phone] 08/15/2016, 12:13 PM    Subjective:  MD said I was going home.  Objective Vitals:   08/14/16 1515 08/14/16 1747 08/14/16 2007 08/15/16 0407  BP: 116/64 (!) 106/47 (!) 102/53 (!) 103/52  Pulse: 74 80 80 65  Resp: _0 Temp: 98.3 F (36.8 C) 99.9 F (37.7 C) 99.5 F (37.5 C) 97.9 F (36.6 C)  TempSrc: Oral Oral Axillary Oral  SpO2: 98% 98% 98% 100%  Weight: 134.2 kg (295 lb 13.7 oz)  135.1 kg (297 lb 13.5 oz)   Height:       Physical Exam General: NAD Heart: RRR  Lungs: no rales Abdomen: obese soft  Extremities:chronic lymphedema, edema not sig Dialysis Access: left  lower AVF + bruit  Dialysis Orders: [facility]    4h 28mn  132kg  Hep 2000 then 1000/ hr  LFA AVF EPO 2000  Hect 5  Venofer 50/wk  Additional Objective Labs: Basic Metabolic Panel:  Recent Labs Lab 08/12/16 0650 08/12/16 1345 08/14/16 1100  NA 135 136 133*  K 4.4 4.6 4.3  CL 102 103 99*  CO2 22 20* 24  GLUCOSE 123* 105* 101*  BUN 48* 52* 40*  CREATININE 7.74* 7.81* 6.62*  CALCIUM 8.3* 8.3* 9.0  PHOS  --  5.9* 6.2*   Liver Function Tests:  Recent Labs Lab 08/11/16 1831 08/12/16 0650 08/12/16 1345 08/14/16 1100  AST 27 16  --   --   ALT 22 15  --   --   ALKPHOS 136* 91  --   --   BILITOT 0.9 1.1  --   --   PROT 7.9 6.2*  --   --   ALBUMIN 4.2 3.0* 2.9* 2.9*  CBC:  Recent Labs Lab 08/11/16 1831 08/12/16 0650 08/12/16 1345 08/13/16 0335 08/14/16 0549  WBC 22.8* 19.0* 14.3* 9.0 6.4  NEUTROABS 21.1*  --   --   --   --   HGB 12.8 11.0* 10.3* 9.7* 11.0*  HCT 38.4 34.5* 32.6* 31.0* 34.5*  MCV  96.5 96.1 97.3 97.8 98.3  PLT 165 174 167 151 177   Blood Culture    Component Value Date/Time   SDES BLOOD RIGHT ARM 08/11/2016 1841   SPECREQUEST BOTTLES DRAWN AEROBIC AND ANAEROBIC 6CC 08/11/2016 1841   CULT STAPHYLOCOCCUS SPECIES (COAGULASE NEGATIVE) (A) 08/11/2016 1841   REPTSTATUS PENDING 08/11/2016 1841    Cardiac Enzymes:  Recent Labs Lab 08/13/16 1658  CKTOTAL 33*   CBG:  Recent Labs Lab 08/13/16 2012 08/14/16 0718 08/14/16 1632 08/14/16 2010 08/15/16 0750  GLUCAP 170* 110* 154* 241* 107*   Iron Studies: No results for input(s): IRON, TIBC, TRANSFERRIN, FERRITIN in the last 72 hours. Lab Results  Component Value Date   INR 1.15 08/13/2016   INR 1.37 11/29/2015   INR 1.28 11/27/2015   Studies/Results: Mr Brain Wo Contrast  Result Date: 08/13/2016 CLINICAL DATA:  Numbness and tingling in the hands.  Headache. EXAM: MRI HEAD WITHOUT CONTRAST TECHNIQUE: Multiplanar, multiecho pulse sequences of the brain and surrounding structures were  obtained without intravenous contrast. COMPARISON:  CT 11/13/2011 FINDINGS: There has been previous of occipital craniectomy. Cerebellar tonsils extend down as far as mid C2. There is no evidence of old or acute brain infarction, mass lesion, hemorrhage, hydrocephalus or extra-axial collection. No sign of demyelinating disease. Insignificant at 9 mm choroid plexus cyst at the foramen of Luschka region on the right. No pituitary mass. No significant sinus disease. Major vessels at the base of the brain show flow. IMPRESSION: No acute or significant finding. Previous suboccipital craniectomy for treatment of Chiari malformation. Electronically Signed   By: Nelson Chimes M.D.   On: 08/13/2016 15:13   Mr Cervical Spine Wo Contrast  Result Date: 08/13/2016 CLINICAL DATA:  Numbness and tingling in hands. Dialysis patient. Fever EXAM: MRI CERVICAL SPINE WITHOUT CONTRAST TECHNIQUE: Multiplanar, multisequence MR imaging of the cervical spine was performed. No intravenous contrast was administered. COMPARISON:  MRI head 08/13/2016 FINDINGS: Image quality degraded by moderate motion. Alignment: Normal alignment Vertebrae: Diffusely abnormal bone marrow which is low signal on T1 and T2 compatible with chronic hemodialysis and renal osteodystrophy. No fracture No evidence of discitis/ osteomyelitis in the cervical spine. Cord: Focal fluid collection within the spinal cord at the C3-4 level measuring approximately 3 x 10 mm on sagittal images. This may be a small syrinx related to Chiari malformation. Posterior Fossa, vertebral arteries, paraspinal tissues: Prior Chiari decompression. There is volume loss and hyperintensity in the left cerebellar tonsil compatible with prior chronic infarction. Disc levels: C2-3:  Negative C3-4:  Small central disc protrusion without spinal stenosis C4-5:  Small central disc protrusion without spinal stenosis C5-6:  Small central disc protrusion without spinal stenosis C6-7:  Negative C7-T1:   Negative IMPRESSION: No evidence of spondylitis in the cervical spine. Renal osteodystrophy Chiari malformation with chronic infarction left cerebellar tonsil. Small syrinx in the cord at the C3-4 level. Electronically Signed   By: Franchot Gallo M.D.   On: 08/13/2016 16:03   Ir Removal Tun Cv Cath W/o Fl  Result Date: 08/14/2016 CLINICAL DATA:  Suspected line infection EXAM: EXAM TUNNELED PORT CATHETER REMOVAL TECHNIQUE: The procedure, risks (including but not limited to bleeding, infection, organ damage ), benefits, and alternatives were explained to the patient. Questions regarding the procedure were encouraged and answered. The patient understands and consents to the procedure. Intravenous Fentanyl and Versed were administered as conscious sedation during continuous monitoring of the patient's level of consciousness and physiological / cardiorespiratory status by the radiology RN, with a  total moderate sedation time of 15 minutes. Overlying skin prepped with chlorhexidine, draped in usual sterile fashion, infiltrated locally with 1% lidocaine. A small incision was made over the scar from previous placement. The port catheter was dissected free from the underlying soft tissues and removed intact. Hemostasis was achieved. The port pocket was closed with deep interrupted and subcuticular continuous 3-0 Monocryl sutures, then covered with Dermabond. The patient tolerated the procedure well. COMPLICATIONS: COMPLICATIONS None immediate IMPRESSION: 1.  Technically successful tunneled Port catheter removal. Electronically Signed   By: Lucrezia Europe M.D.   On: 08/14/2016 10:19   Medications: . sodium chloride 10 mL/hr at 08/13/16 0000   . DAPTOmycin (CUBICIN)  IV  915 mg Intravenous Q T,Th,Sat-1800  . darbepoetin (ARANESP) injection - DIALYSIS  25 mcg Intravenous Q Thu-HD  . doxercalciferol  5 mcg Intravenous Q T,Th,Sa-HD  . heparin  5,000 Units Subcutaneous Q8H  . mouth rinse  7 mL Mouth Rinse BID  .  multivitamin  1 tablet Oral QHS  . sevelamer carbonate  3,200 mg Oral TID WC

## 2016-08-15 NOTE — Progress Notes (Signed)
  Pharmacy Antibiotic Note  [name] is a 50 y.o. female admitted on 08/11/2016 with sepsis, MRSE bacteremia - source possibly from port-a-cath (ID recommends to remove).  Pharmacy has been consulted per ID for daptomycin dosing - plan 14 days of therapy.   ESRD on HD TTS (on schedule). Afebrile, wbc down to wnl, LA 1.51. Noted patient recently lost 160 lb intentionally - monitor for changes in weight. Pt will be discharged home on daptomycin. Current weight = 135 kg  Baseline CK = 33  Plan: Adjust Daptomycin to 1000 mg (~68m/kg) IV q HD (T Th Sat) Plan 14 days of therapy per ID Baseline CK and weekly Monitor clinical progress, c/s, renal function  Height: _0  (165.1 cm) Weight: 297 lb 13.5 oz (135.1 kg) IBW/kg (Calculated) : 57  Temp (24hrs), Avg:98.6 F (37 C), Min:97.9 F (36.6 C), Max:99.9 F (37.7 C)   Recent Labs Lab 08/11/16 1831 08/11/16 1843 08/11/16 2226 08/12/16 0650 08/12/16 1345 08/13/16 0335 08/14/16 0549 08/14/16 1100  WBC 22.8*  --   --  19.0* 14.3* 9.0 6.4  --   CREATININE 7.21*  --   --  7.74* 7.81*  --   --  6.62*  LATICACIDVEN  --  1.81 1.51  --   --   --   --   --     Estimated Creatinine Clearance: 14.2 mL/min (by C-G formula based on SCr of 6.62 mg/dL).    Allergies  Allergen Reactions  . Contrast Media [Iodinated Diagnostic Agents] Anaphylaxis, Hives and Swelling    Dye for cardiac cath. Tongue swells  . Pineapple Anaphylaxis, Itching and Swelling  . Pneumococcal Vaccines Swelling    Turns skin black, and bodily swelling  . Shellfish Allergy Itching and Swelling    Facial swelling   . Vancomycin Nausea And Vomiting    Infusion "made me feel like I was dying" had to be readmitted to hospital    Antimicrobials this admission: 8/23 Zosyn>>8/25 8/24 Doxy IV>>8/25 8/25 dapto>>  Dose adjustments this admission:   Microbiology results: 8/24 mrsa pcr: neg 8/23 BCx2: 2/2 GPC 8/23 BCID: MRSE  Thank you LAnette Guarneri  PharmD [phone]/27/2017 8:28 AM

## 2016-08-15 NOTE — Discharge Instructions (Signed)
Bacteremia Bacteremia is the presence of bacteria in the blood. A small amount of bacteria may not cause any symptoms. Sometimes, the bacteria spread and cause infection in other parts of the body, such as the heart, joints, bones, or brain. Having a great amount of bacteria can cause a serious, sometimes life-threatening infection called sepsis. CAUSES This condition is caused by bacteria that get into the blood. Bacteria can enter the blood:  During a dental or medical procedure.  After you brush your teeth so hard that the gums bleed.  Through a scrape or cut on your skin. More severe types of bacteremia can be caused by:  A bacterial infection, such as pneumonia, that spreads to the blood.  Using a dirty needle. RISK FACTORS This condition is more likely to develop in:  Children and elderly adults.  People who have a long-lasting (chronic) disease or medical condition.  People who have an artificial joint or heart valve.  People who have heart valve disease.  People who have a tube, such as a catheter or IV tube, that has been inserted for a medical treatment.  People who have a weak body defense system (immune system).  People who use IV drugs. SYMPTOMS Usually, this condition does not cause symptoms when it is mild. When it is more serious, it may cause:  Fever.  Chills.  Racing heart.  Shortness of breath.  Dizziness.  Weakness.  Confusion.  Nausea or vomiting.  Diarrhea. Bacteremia that has spread to other parts of the body may cause symptoms in those areas. DIAGNOSIS This condition may be diagnosed with a physical exam and tests, such as:  A complete blood count (CBC). This test looks for signs of infection.  Blood cultures. These look for bacteria in your blood.  Tests of any IV tubes. These look for a source of infection.  Urine tests.  Imaging tests, such as an X-ray, CT scan, MRI, or heart ultrasound. TREATMENT If the condition is mild,  treatment is usually not needed. Usually, the body's immune system will remove the bacteria. If the condition is more serious, it may be treated with:  Antibiotic medicines through an IV tube. These may be given for about 2 weeks. At first, the antibiotic that is given may kill most types of blood bacteria. If your test results show that a certain kind of bacteria is causing problems, the antibiotic may be changed to kill only the bacteria that are causing problems.  Antibiotics taken by mouth.  Removing any catheter or IV tube that is a source of infection.  Blood pressure and breathing support, if needed.  Surgery to control the source or spread of infection, if needed. HOME CARE INSTRUCTIONS  Take over-the-counter and prescription medicines only as told by your health care provider.  If you were prescribed an antibiotic, take it as told by your health care provider. Do not stop taking the antibiotic even if you start to feel better.  Rest at home until your condition is under control.  Drink enough fluid to keep your urine clear or pale yellow.  Keep all follow-up visits as told by your health care provider. This is important. PREVENTION Take these actions to help prevent future episodes of bacteremia:  Get all vaccinations as recommended by your health care provider.  Clean and cover scrapes or cuts.  Bathe regularly.  Wash your hands often.  Before any dental or surgical procedure, ask your health care provider if you should take an antibiotic. [place]  CARE IF:  Your symptoms get worse.  You continue to have symptoms after treatment.  You develop new symptoms after treatment. SEEK IMMEDIATE MEDICAL CARE IF:  You have chest pain or trouble breathing.  You develop confusion, dizziness, or weakness.  You develop pale skin.   This information is not intended to replace advice given to you by your health care provider. Make sure you discuss any questions you have  with your health care provider.   Document Released: 09/19/2006 Document Revised: 08/27/2015 Document Reviewed: 02/08/2015 Elsevier Interactive Patient Education [employer].

## 2016-08-16 ENCOUNTER — Encounter (HOSPITAL_COMMUNITY): Payer: Self-pay

## 2016-08-16 LAB — GLUCOSE, CAPILLARY: GLUCOSE-CAPILLARY: 141 mg/dL — AB (ref 65–99)

## 2016-08-16 NOTE — Progress Notes (Signed)
Reviewed with patient discharge paperwork, prescriptions, follow-up appointments, and answered all questions. D/C'd IV. All belongings sent with patient. Patient escorted downstairs by wheelchair to home with family.  Bud Face, RN

## 2016-08-17 ENCOUNTER — Encounter (HOSPITAL_COMMUNITY): Payer: Self-pay

## 2016-08-17 ENCOUNTER — Emergency Department (HOSPITAL_COMMUNITY)
Admission: EM | Admit: 2016-08-17 | Discharge: 2016-08-17 | Disposition: A | Discharge status: Home / Self Care | Payer: Medicare Other | Attending: Emergency Medicine | Admitting: Emergency Medicine

## 2016-08-17 DIAGNOSIS — R7881 Bacteremia: Secondary | ICD-10-CM | POA: Diagnosis not present

## 2016-08-17 DIAGNOSIS — R69 Illness, unspecified: Secondary | ICD-10-CM | POA: Diagnosis not present

## 2016-08-17 DIAGNOSIS — N183 Chronic kidney disease, stage 3 (moderate): Secondary | ICD-10-CM | POA: Insufficient documentation

## 2016-08-17 DIAGNOSIS — Z85850 Personal history of malignant neoplasm of thyroid: Secondary | ICD-10-CM | POA: Insufficient documentation

## 2016-08-17 DIAGNOSIS — J45909 Unspecified asthma, uncomplicated: Secondary | ICD-10-CM | POA: Insufficient documentation

## 2016-08-17 DIAGNOSIS — I5032 Chronic diastolic (congestive) heart failure: Secondary | ICD-10-CM | POA: Insufficient documentation

## 2016-08-17 DIAGNOSIS — N2581 Secondary hyperparathyroidism of renal origin: Secondary | ICD-10-CM | POA: Diagnosis not present

## 2016-08-17 DIAGNOSIS — Z79899 Other long term (current) drug therapy: Secondary | ICD-10-CM | POA: Insufficient documentation

## 2016-08-17 DIAGNOSIS — E1122 Type 2 diabetes mellitus with diabetic chronic kidney disease: Secondary | ICD-10-CM | POA: Insufficient documentation

## 2016-08-17 DIAGNOSIS — Z7951 Long term (current) use of inhaled steroids: Secondary | ICD-10-CM | POA: Insufficient documentation

## 2016-08-17 DIAGNOSIS — I130 Hypertensive heart and chronic kidney disease with heart failure and stage 1 through stage 4 chronic kidney disease, or unspecified chronic kidney disease: Secondary | ICD-10-CM | POA: Insufficient documentation

## 2016-08-17 DIAGNOSIS — N186 End stage renal disease: Secondary | ICD-10-CM | POA: Diagnosis not present

## 2016-08-17 DIAGNOSIS — D509 Iron deficiency anemia, unspecified: Secondary | ICD-10-CM | POA: Diagnosis not present

## 2016-08-17 DIAGNOSIS — D631 Anemia in chronic kidney disease: Secondary | ICD-10-CM | POA: Diagnosis not present

## 2016-08-17 DIAGNOSIS — R110 Nausea: Secondary | ICD-10-CM | POA: Diagnosis not present

## 2016-08-17 MED ORDER — SODIUM CHLORIDE 0.9 % IV SOLN
1000.0000 mg | Freq: Once | INTRAVENOUS | Status: AC
  Administered 2016-08-17: 1000 mg via INTRAVENOUS
  Filled 2016-08-17: qty 20

## 2016-08-17 NOTE — ED Provider Notes (Signed)
[facility] DEPT Provider Note   CSN: [id number] Arrival date & time: 08/17/16  1149  By signing my name below, I, Soijett Blue, attest that this documentation has been prepared under the direction and in the presence of Carmin Muskrat, MD. Electronically Signed: Soijett Blue, ED Scribe. 08/17/16. 12:28 PM.   History   Chief Complaint Chief Complaint  Patient presents with  . Other    Needs antibiotic    HPI  [name] is a 50 y.o. female who presents to the Emergency Department complaining of issues with port-a-cath onset today. Pt notes that she is here from dialysis due to her port-a-cath being infected and she notes that she needs a dose of IV daptomycin. Pt reports that they didn't have the antibiotic at the dialysis center and they sent her here for further evaluation. Pt notes that they informed her that they will have the antibiotic later this week. She states that she is having associated symptoms of sore throat. She states that she has not tried any medications for the with no relief for her symptoms. She denies fever, vomiting, and any other symptoms.   The history is provided by the patient. No language interpreter was used.    Past Medical History:  Diagnosis Date  . Anxiety   . Asthma    as a child  . Cancer ([place])    thyroid  . Cellulitis   . CHF (congestive heart failure) ([place])   . Chiari malformation    s/p surgery  . Chiari malformation   . Chronic pain   . Complication of anesthesia 11/28/15   Resp arrest after  conscious  sedation  . Constipation   . Coronary artery disease    40-50% mid LAD 04/29/09, Medical tx. (Dr. Gwenlyn Found)  . Diabetes mellitus    Type II  . Fibromyalgia   . History of blood transfusion    hemorrage duinrg pregancy  . Hx of echocardiogram 10/2011   EF 55-60%  . Hypercholesterolemia   . Hypertension   . Lymph edema   . Obesity hypoventilation syndrome ([place])   . On home O2   . Pneumonia    in past  . Renal disorder      Pt started dialysis in Dec.2016  . S/P colonoscopy 05/26/2007   Dr. Laural Golden sigmoid diverticulosis random biopsies benign  . S/P endoscopy 05/01/2009   Dr. Penelope Coop pill-induced esophageal ulcerations distal to midesophagus, 2 small ulcers in the antrum of the stomach  . Shortness of breath dyspnea    with any exertion or if heart rate  is irregular while on dialysis  . Sleep apnea     Patient Active Problem List   Diagnosis Date Noted  . Numbness and tingling in right hand 08/13/2016  . Sepsis ([place]) 08/11/2016  . Coagulase negative Staphylococcus bacteremia 06/11/2016  . UTI (lower urinary tract infection) 06/06/2016  . ESRD (end stage renal disease) ([place]) 06/06/2016  . Chronic cholecystitis 12/09/2015  . Protein-calorie malnutrition, severe 11/29/2015  . Respiratory arrest ([place]) 11/28/2015  . Recurrent cellulitis of lower leg 11/13/2015  . Chronic diastolic CHF (congestive heart failure) ([place]) 11/13/2015  . Chronic respiratory failure with hypoxia ([place]) 11/13/2015  . CKD (chronic kidney disease), stage III 11/13/2015  . Anemia of chronic disease 11/13/2015  . Rotator cuff syndrome of right shoulder 05/30/2013  . Pain in joint, shoulder region 05/30/2013  . Obesity hypoventilation syndrome ([place]) 09/04/2012  . Leukopenia 11/11/2011  . HTN (hypertension) 11/11/2011  . HLD (hyperlipidemia) 11/11/2011  .  Transaminitis 11/11/2011  . Diabetes mellitus ([place]) 10/27/2011  . Morbid obesity ([place]) 10/27/2011  . Lymphedema of lower extremity 10/04/2011    Past Surgical History:  Procedure Laterality Date  . Marland KitchenHemodialysis catheter Right 11/28/2015  . ABDOMINAL HYSTERECTOMY    . ADENOIDECTOMY    . AV FISTULA PLACEMENT Left 03/15/2016   Procedure: CREATION OF LEFT ARM ARTERIOVENOUS (AV) FISTULA  ;  Surgeon: Rosetta Posner, MD;  Location: [place];  Service: Vascular;  Laterality: Left;  . CESAREAN SECTION      x 2  . CRANIECTOMY SUBOCCIPITAL W/ CERVICAL LAMINECTOMY / CHIARI    . FISTULA  SUPERFICIALIZATION Left 05/10/2016   Procedure: Left Arm FISTULA SUPERFICIALIZATION;  Surgeon: Rosetta Posner, MD;  Location: [facility];  Service: Vascular;  Laterality: Left;  . IR GENERIC HISTORICAL  08/14/2016   IR REMOVAL TUN ACCESS W/ PORT W/O FL MOD SED 08/14/2016 Arne Cleveland, MD [facility]-INTERV RAD  . PORTACATH PLACEMENT  07/05/2012   Procedure: INSERTION PORT-A-CATH;  Surgeon: Donato Heinz, MD;  Location: [facility];  Service: General;  Laterality: Left;  subclavian  . THYROIDECTOMY, PARTIAL    . TONSILLECTOMY      OB History    Gravida Para Term Preterm AB Living   _0 SAB TAB Ectopic Multiple Live Births   1               Home Medications    Prior to Admission medications   Medication Sig Start Date End Date Taking? Authorizing Provider  acetaminophen (TYLENOL) 325 MG tablet Take 650 mg by mouth every 6 (six) hours as needed for moderate pain or fever.     Historical Provider, MD  albuterol (PROVENTIL) (2.5 MG/3ML) 0.083% nebulizer solution Take 2.5 mg by nebulization every 6 (six) hours as needed for wheezing or shortness of breath.    Historical Provider, MD  ALPRAZolam Duanne Moron) 0.5 MG tablet Take 0.5 mg by mouth 3 (three) times daily as needed for anxiety or sleep.     Historical Provider, MD  amLODipine (NORVASC) 10 MG tablet Take 0.5 tablets (5 mg total) by mouth daily. 06/11/16   [name], MD  DAPTOmycin 1,000 mg in sodium chloride 0.9 % 100 mL Inject 1,000 mg into the vein every Tuesday, Thursday, and Saturday at 6 PM. To be given after hemodialysis. Total duration: 14 days. Start date: 08/13/16. 08/17/16   Modena Jansky, MD  diphenhydrAMINE (BENADRYL) 25 mg capsule Take 25 mg by mouth every 6 (six) hours as needed for allergies.    Historical Provider, MD  metoprolol (LOPRESSOR) 50 MG tablet Take 0.5 tablets (25 mg total) by mouth 2 (two) times daily. 12/04/15   [name], MD  multivitamin (RENA-VIT) TABS tablet Take 1 tablet by mouth daily.    Historical  Provider, MD  ondansetron (ZOFRAN-ODT) 4 MG disintegrating tablet Take 4 mg by mouth every 8 (eight) hours as needed for nausea or vomiting.    Historical Provider, MD  Oxycodone HCl 10 MG TABS Take 1 tablet (10 mg total) by mouth 3 (three) times daily as needed (pain). 05/10/16   Alvia Grove, PA-C  sevelamer carbonate (RENVELA) 800 MG tablet Take 1,600-2,700 mg by mouth 3 (three) times daily with meals. Takes 3 tablets with meals and 2 with snacks    Historical Provider, MD    Family History Family History  Problem Relation Age of Onset  . Colon cancer Mother 17  . Stroke Mother  67  . Coronary artery disease Mother     Social History Social History  Substance Use Topics  . Smoking status: Never Smoker  . Smokeless tobacco: Never Used  . Alcohol use No     Allergies   Contrast media [iodinated diagnostic agents]; Pineapple; Pneumococcal vaccines; Shellfish allergy; and Vancomycin   Review of Systems Review of Systems  Constitutional:       Per HPI, otherwise negative  HENT:       Per HPI, otherwise negative  Respiratory:       Per HPI, otherwise negative  Cardiovascular:       Per HPI, otherwise negative  Gastrointestinal: Negative for vomiting.  Endocrine:       Negative aside from HPI  Genitourinary:       Neg aside from HPI   Musculoskeletal:       Per HPI, otherwise negative  Skin: Negative.   Neurological: Negative for syncope.     Physical Exam Updated Vital Signs BP 134/74 (BP Location: Right Arm)   Pulse 70   Temp 97.8 F (36.6 C) (Oral)   Resp 18   Ht _0  (1.651 m)   Wt 252 lb (114.3 kg)   SpO2 100%   BMI 41.93 kg/m   Physical Exam  Constitutional: She is oriented to person, place, and time. She appears well-developed and well-nourished. No distress.  HENT:  Head: Normocephalic and atraumatic.  Mouth/Throat: Uvula is midline, oropharynx is clear and moist and mucous membranes are normal. No oropharyngeal exudate, posterior oropharyngeal  edema or posterior oropharyngeal erythema.  Eyes: Conjunctivae and EOM are normal.  Cardiovascular: Normal rate and regular rhythm.   Pulmonary/Chest: Effort normal and breath sounds normal. No stridor. No respiratory distress.  Abdominal: She exhibits no distension.  Musculoskeletal: She exhibits no edema.  Neurological: She is alert and oriented to person, place, and time. No cranial nerve deficit.  Skin: Skin is warm and dry. No erythema.  Left AV fistula site without surrounding erythema. Left port-a-cath excision without surrounding erythema.  Psychiatric: She has a normal mood and affect.  Nursing note and vitals reviewed.    ED Treatments / Results  DIAGNOSTIC STUDIES: Oxygen Saturation is 100% on RA, nl by my interpretation.    COORDINATION OF CARE: 12:22 PM Discussed treatment plan with pt at bedside which includes daptomycin and pt agreed to plan.   EMR reviewed, notable for recent admission with relevant d/c summary as below:  Sepsis secondary to recurrent coagulase-negative staph bacteremia - Chest x-ray without acute findings. CT abdomen: Apparent wall thickening involving the hepatic flexure and ascending colon >less likely mild colitis in the absence of diarrhea. Patient does have mild left-sided abdominal pain and nausea without vomiting.  - Lactate normal. - Blood cultures 2: Coagulase-negative staph-one of these cultures was from Port-A-Cath. - Initially normotensive but became hypotensive and [facility] ED and was bolused with IV fluids and blood pressures improved. - Empirically started on IV vancomycin and doxycycline.  - On 8/25, both blood cultures drawn on admission showed coagulase-negative staph. One of these cultures was from Port-A-Cath. Infectious disease was consulted. She has had recurrent coagulase-negative staph bacteremia. The source is not entirely clear but ID is concerned about it coming from her Port-A-Cath. Recommend removing Port-A-Cath (no longer needs  Port-A-Cath). Antibiotics changed to IV daptomycin and plan 14 days of therapy. - Sepsis physiology resolved. - Port A Cath removed by IR on 8/26. Had low-grade fever off 99.48F yesterday evening. Discussed with infectious disease  M.D. on call today, no need for surveillance blood cultures. - Discussed with Dr. Jonnie Finner and Ms. Reinaldo Meeker, Nephrology regarding planned discharge for today, they have arranged for outpatient IV antibiotics postdialysis at patient's primary dialysis Center. Recommend following labs as per pharmacy recommendation below.   Pharmacy recommendation: Adjust Daptomycin to 1000 mg (~75m/kg) IV q HD (T Th Sat) Plan 14 days of therapy per ID Baseline CK and weekly Monitor clinical progress, c/s, renal function  Radiology No results found.  Procedures Procedures (including critical care time)  Medications Ordered in ED Medications  DAPTOmycin (CUBICIN) 1,000 mg in sodium chloride 0.9 % IVPB (not administered)   Following administration of the IV antibiotics the patient remained in unremarkable condition, no new complaints.  Initial Impression / Assessment and Plan / ED Course   Patient with recent admission for sepsis presents with fever for antibiotic provision. The patient is awake and alert, with no evidence for new bacteremia, decompensated state. Patient required placement of IV for administration of IV antibiotics. This was well tolerated, with no notable changes in her condition, patient discharged in stable condition to continue outpatient therapy.    RCarmin Muskrat MD 08/17/16 1314

## 2016-08-17 NOTE — Progress Notes (Signed)
  Pharmacy Antibiotic Note  [name] is a 50 y.o. female admitted on 08/17/2016 with sepsis, MRSE bacteremia - source possibly from port-a-cath (ID recommends to remove).  Pharmacy has been consulted per ID for daptomycin dosing - plan 14 days of therapy.  She presents to [facility] ED today for dose of daptomycin as dialysis center does not have   Baseline CK = 33  Plan: Daptomycin 1000 mg (~40m/kg) IV today Plan 14 days of therapy per ID Baseline CK and weekly Monitor clinical progress  Height: 5' 5" (165.1 cm) Weight: 252 lb (114.3 kg) IBW/kg (Calculated) : 57  Temp (24hrs), Avg:97.8 F (36.6 C), Min:97.8 F (36.6 C), Max:97.8 F (36.6 C)   Recent Labs Lab 08/11/16 1831 08/11/16 1843 08/11/16 2226 08/12/16 0650 08/12/16 1345 08/13/16 0335 08/14/16 0549 08/14/16 1100  WBC 22.8*  --   --  19.0* 14.3* 9.0 6.4  --   CREATININE 7.21*  --   --  7.74* 7.81*  --   --  6.62*  LATICACIDVEN  --  1.81 1.51  --   --   --   --   --     Estimated Creatinine Clearance: 12.8 mL/min (by C-G formula based on SCr of 6.62 mg/dL).    Allergies  Allergen Reactions  . Contrast Media [Iodinated Diagnostic Agents] Anaphylaxis, Hives and Swelling    Dye for cardiac cath. Tongue swells  . Pineapple Anaphylaxis, Itching and Swelling  . Pneumococcal Vaccines Swelling    Turns skin black, and bodily swelling  . Shellfish Allergy Itching and Swelling    Facial swelling   . Vancomycin Nausea And Vomiting    Infusion "made me feel like I was dying" had to be readmitted to hospital    Antimicrobials this admission: 8/23 Zosyn>>8/25 8/24 Doxy IV>>8/25 8/25 dapto>>   Thank you LExcell SeltzerPharmD 08/17/2016 12:30 PM

## 2016-08-17 NOTE — ED Triage Notes (Signed)
Pt reports her portacath was infected and she needs a dose of daptomycin IV to be started today.  Per dialysis they can't get the medication until tomorrow.  Pt c/o sore throat.

## 2016-08-17 NOTE — Discharge Instructions (Signed)
As discussed, your evaluation today has been largely reassuring.  But, it is important that you monitor your condition carefully, and do not hesitate to return to the ED if you develop new, or concerning changes in your condition.  Otherwise, please follow-up with your physician for appropriate ongoing care.  Please be sure to continue the home care, antibiotic regimen, as described during your recent hospitalization.

## 2016-08-19 DIAGNOSIS — D509 Iron deficiency anemia, unspecified: Secondary | ICD-10-CM | POA: Diagnosis not present

## 2016-08-19 DIAGNOSIS — Z992 Dependence on renal dialysis: Secondary | ICD-10-CM | POA: Diagnosis not present

## 2016-08-19 DIAGNOSIS — R7881 Bacteremia: Secondary | ICD-10-CM | POA: Diagnosis not present

## 2016-08-19 DIAGNOSIS — D631 Anemia in chronic kidney disease: Secondary | ICD-10-CM | POA: Diagnosis not present

## 2016-08-19 DIAGNOSIS — N2581 Secondary hyperparathyroidism of renal origin: Secondary | ICD-10-CM | POA: Diagnosis not present

## 2016-08-19 DIAGNOSIS — N186 End stage renal disease: Secondary | ICD-10-CM | POA: Diagnosis not present

## 2016-08-19 DIAGNOSIS — R110 Nausea: Secondary | ICD-10-CM | POA: Diagnosis not present

## 2016-08-21 DIAGNOSIS — B9561 Methicillin susceptible Staphylococcus aureus infection as the cause of diseases classified elsewhere: Secondary | ICD-10-CM | POA: Diagnosis not present

## 2016-08-21 DIAGNOSIS — Z992 Dependence on renal dialysis: Secondary | ICD-10-CM | POA: Diagnosis not present

## 2016-08-21 DIAGNOSIS — Z23 Encounter for immunization: Secondary | ICD-10-CM | POA: Diagnosis not present

## 2016-08-21 DIAGNOSIS — R7881 Bacteremia: Secondary | ICD-10-CM | POA: Diagnosis not present

## 2016-08-21 DIAGNOSIS — N2581 Secondary hyperparathyroidism of renal origin: Secondary | ICD-10-CM | POA: Diagnosis not present

## 2016-08-21 DIAGNOSIS — D631 Anemia in chronic kidney disease: Secondary | ICD-10-CM | POA: Diagnosis not present

## 2016-08-21 DIAGNOSIS — D509 Iron deficiency anemia, unspecified: Secondary | ICD-10-CM | POA: Diagnosis not present

## 2016-08-21 DIAGNOSIS — N186 End stage renal disease: Secondary | ICD-10-CM | POA: Diagnosis not present

## 2016-08-24 DIAGNOSIS — N2581 Secondary hyperparathyroidism of renal origin: Secondary | ICD-10-CM | POA: Diagnosis not present

## 2016-08-24 DIAGNOSIS — Z23 Encounter for immunization: Secondary | ICD-10-CM | POA: Diagnosis not present

## 2016-08-24 DIAGNOSIS — N186 End stage renal disease: Secondary | ICD-10-CM | POA: Diagnosis not present

## 2016-08-24 DIAGNOSIS — R7881 Bacteremia: Secondary | ICD-10-CM | POA: Diagnosis not present

## 2016-08-24 DIAGNOSIS — D631 Anemia in chronic kidney disease: Secondary | ICD-10-CM | POA: Diagnosis not present

## 2016-08-24 DIAGNOSIS — D509 Iron deficiency anemia, unspecified: Secondary | ICD-10-CM | POA: Diagnosis not present

## 2016-08-26 DIAGNOSIS — N2581 Secondary hyperparathyroidism of renal origin: Secondary | ICD-10-CM | POA: Diagnosis not present

## 2016-08-26 DIAGNOSIS — I890 Lymphedema, not elsewhere classified: Secondary | ICD-10-CM | POA: Diagnosis not present

## 2016-08-26 DIAGNOSIS — E1122 Type 2 diabetes mellitus with diabetic chronic kidney disease: Secondary | ICD-10-CM | POA: Diagnosis not present

## 2016-08-26 DIAGNOSIS — I5032 Chronic diastolic (congestive) heart failure: Secondary | ICD-10-CM | POA: Diagnosis not present

## 2016-08-26 DIAGNOSIS — Z23 Encounter for immunization: Secondary | ICD-10-CM | POA: Diagnosis not present

## 2016-08-26 DIAGNOSIS — N186 End stage renal disease: Secondary | ICD-10-CM | POA: Diagnosis not present

## 2016-08-26 DIAGNOSIS — R531 Weakness: Secondary | ICD-10-CM | POA: Diagnosis not present

## 2016-08-26 DIAGNOSIS — D509 Iron deficiency anemia, unspecified: Secondary | ICD-10-CM | POA: Diagnosis not present

## 2016-08-26 DIAGNOSIS — I132 Hypertensive heart and chronic kidney disease with heart failure and with stage 5 chronic kidney disease, or end stage renal disease: Secondary | ICD-10-CM | POA: Diagnosis not present

## 2016-08-26 DIAGNOSIS — D631 Anemia in chronic kidney disease: Secondary | ICD-10-CM | POA: Diagnosis not present

## 2016-08-26 DIAGNOSIS — R7881 Bacteremia: Secondary | ICD-10-CM | POA: Diagnosis not present

## 2016-08-28 DIAGNOSIS — J449 Chronic obstructive pulmonary disease, unspecified: Secondary | ICD-10-CM | POA: Diagnosis not present

## 2016-08-28 DIAGNOSIS — N186 End stage renal disease: Secondary | ICD-10-CM | POA: Diagnosis not present

## 2016-08-28 DIAGNOSIS — I5032 Chronic diastolic (congestive) heart failure: Secondary | ICD-10-CM | POA: Diagnosis not present

## 2016-08-28 DIAGNOSIS — E1122 Type 2 diabetes mellitus with diabetic chronic kidney disease: Secondary | ICD-10-CM | POA: Diagnosis not present

## 2016-08-28 DIAGNOSIS — I890 Lymphedema, not elsewhere classified: Secondary | ICD-10-CM | POA: Diagnosis not present

## 2016-08-28 DIAGNOSIS — R531 Weakness: Secondary | ICD-10-CM | POA: Diagnosis not present

## 2016-08-28 DIAGNOSIS — Z9981 Dependence on supplemental oxygen: Secondary | ICD-10-CM | POA: Diagnosis not present

## 2016-08-28 DIAGNOSIS — D509 Iron deficiency anemia, unspecified: Secondary | ICD-10-CM | POA: Diagnosis not present

## 2016-08-28 DIAGNOSIS — E662 Morbid (severe) obesity with alveolar hypoventilation: Secondary | ICD-10-CM | POA: Diagnosis not present

## 2016-08-28 DIAGNOSIS — I2510 Atherosclerotic heart disease of native coronary artery without angina pectoris: Secondary | ICD-10-CM | POA: Diagnosis not present

## 2016-08-28 DIAGNOSIS — N2581 Secondary hyperparathyroidism of renal origin: Secondary | ICD-10-CM | POA: Diagnosis not present

## 2016-08-28 DIAGNOSIS — I132 Hypertensive heart and chronic kidney disease with heart failure and with stage 5 chronic kidney disease, or end stage renal disease: Secondary | ICD-10-CM | POA: Diagnosis not present

## 2016-08-28 DIAGNOSIS — D631 Anemia in chronic kidney disease: Secondary | ICD-10-CM | POA: Diagnosis not present

## 2016-08-28 DIAGNOSIS — Z23 Encounter for immunization: Secondary | ICD-10-CM | POA: Diagnosis not present

## 2016-08-28 DIAGNOSIS — R7881 Bacteremia: Secondary | ICD-10-CM | POA: Diagnosis not present

## 2016-08-28 DIAGNOSIS — Z992 Dependence on renal dialysis: Secondary | ICD-10-CM | POA: Diagnosis not present

## 2016-08-28 DIAGNOSIS — M797 Fibromyalgia: Secondary | ICD-10-CM | POA: Diagnosis not present

## 2016-08-30 DIAGNOSIS — D631 Anemia in chronic kidney disease: Secondary | ICD-10-CM | POA: Diagnosis not present

## 2016-08-30 DIAGNOSIS — D509 Iron deficiency anemia, unspecified: Secondary | ICD-10-CM | POA: Diagnosis not present

## 2016-08-30 DIAGNOSIS — R7881 Bacteremia: Secondary | ICD-10-CM | POA: Diagnosis not present

## 2016-08-30 DIAGNOSIS — N186 End stage renal disease: Secondary | ICD-10-CM | POA: Diagnosis not present

## 2016-08-30 DIAGNOSIS — Z23 Encounter for immunization: Secondary | ICD-10-CM | POA: Diagnosis not present

## 2016-08-30 DIAGNOSIS — N2581 Secondary hyperparathyroidism of renal origin: Secondary | ICD-10-CM | POA: Diagnosis not present

## 2016-08-31 DIAGNOSIS — I5032 Chronic diastolic (congestive) heart failure: Secondary | ICD-10-CM | POA: Diagnosis not present

## 2016-08-31 DIAGNOSIS — I890 Lymphedema, not elsewhere classified: Secondary | ICD-10-CM | POA: Diagnosis not present

## 2016-08-31 DIAGNOSIS — N186 End stage renal disease: Secondary | ICD-10-CM | POA: Diagnosis not present

## 2016-08-31 DIAGNOSIS — R531 Weakness: Secondary | ICD-10-CM | POA: Diagnosis not present

## 2016-08-31 DIAGNOSIS — I132 Hypertensive heart and chronic kidney disease with heart failure and with stage 5 chronic kidney disease, or end stage renal disease: Secondary | ICD-10-CM | POA: Diagnosis not present

## 2016-08-31 DIAGNOSIS — E1122 Type 2 diabetes mellitus with diabetic chronic kidney disease: Secondary | ICD-10-CM | POA: Diagnosis not present

## 2016-09-01 DIAGNOSIS — N186 End stage renal disease: Secondary | ICD-10-CM | POA: Diagnosis not present

## 2016-09-01 DIAGNOSIS — I890 Lymphedema, not elsewhere classified: Secondary | ICD-10-CM | POA: Diagnosis not present

## 2016-09-01 DIAGNOSIS — R531 Weakness: Secondary | ICD-10-CM | POA: Diagnosis not present

## 2016-09-01 DIAGNOSIS — I5032 Chronic diastolic (congestive) heart failure: Secondary | ICD-10-CM | POA: Diagnosis not present

## 2016-09-01 DIAGNOSIS — E1122 Type 2 diabetes mellitus with diabetic chronic kidney disease: Secondary | ICD-10-CM | POA: Diagnosis not present

## 2016-09-01 DIAGNOSIS — I132 Hypertensive heart and chronic kidney disease with heart failure and with stage 5 chronic kidney disease, or end stage renal disease: Secondary | ICD-10-CM | POA: Diagnosis not present

## 2016-09-02 DIAGNOSIS — R7881 Bacteremia: Secondary | ICD-10-CM | POA: Diagnosis not present

## 2016-09-02 DIAGNOSIS — N2581 Secondary hyperparathyroidism of renal origin: Secondary | ICD-10-CM | POA: Diagnosis not present

## 2016-09-02 DIAGNOSIS — D631 Anemia in chronic kidney disease: Secondary | ICD-10-CM | POA: Diagnosis not present

## 2016-09-02 DIAGNOSIS — Z992 Dependence on renal dialysis: Secondary | ICD-10-CM | POA: Diagnosis not present

## 2016-09-02 DIAGNOSIS — D509 Iron deficiency anemia, unspecified: Secondary | ICD-10-CM | POA: Diagnosis not present

## 2016-09-02 DIAGNOSIS — Z23 Encounter for immunization: Secondary | ICD-10-CM | POA: Diagnosis not present

## 2016-09-02 DIAGNOSIS — N186 End stage renal disease: Secondary | ICD-10-CM | POA: Diagnosis not present

## 2016-09-04 DIAGNOSIS — Z23 Encounter for immunization: Secondary | ICD-10-CM | POA: Diagnosis not present

## 2016-09-04 DIAGNOSIS — D509 Iron deficiency anemia, unspecified: Secondary | ICD-10-CM | POA: Diagnosis not present

## 2016-09-04 DIAGNOSIS — R7881 Bacteremia: Secondary | ICD-10-CM | POA: Diagnosis not present

## 2016-09-04 DIAGNOSIS — N2581 Secondary hyperparathyroidism of renal origin: Secondary | ICD-10-CM | POA: Diagnosis not present

## 2016-09-04 DIAGNOSIS — N186 End stage renal disease: Secondary | ICD-10-CM | POA: Diagnosis not present

## 2016-09-04 DIAGNOSIS — D631 Anemia in chronic kidney disease: Secondary | ICD-10-CM | POA: Diagnosis not present

## 2016-09-07 DIAGNOSIS — N186 End stage renal disease: Secondary | ICD-10-CM | POA: Diagnosis not present

## 2016-09-07 DIAGNOSIS — D509 Iron deficiency anemia, unspecified: Secondary | ICD-10-CM | POA: Diagnosis not present

## 2016-09-07 DIAGNOSIS — R7881 Bacteremia: Secondary | ICD-10-CM | POA: Diagnosis not present

## 2016-09-07 DIAGNOSIS — Z23 Encounter for immunization: Secondary | ICD-10-CM | POA: Diagnosis not present

## 2016-09-07 DIAGNOSIS — N2581 Secondary hyperparathyroidism of renal origin: Secondary | ICD-10-CM | POA: Diagnosis not present

## 2016-09-07 DIAGNOSIS — D631 Anemia in chronic kidney disease: Secondary | ICD-10-CM | POA: Diagnosis not present

## 2016-09-08 DIAGNOSIS — I5032 Chronic diastolic (congestive) heart failure: Secondary | ICD-10-CM | POA: Diagnosis not present

## 2016-09-08 DIAGNOSIS — N186 End stage renal disease: Secondary | ICD-10-CM | POA: Diagnosis not present

## 2016-09-08 DIAGNOSIS — E1122 Type 2 diabetes mellitus with diabetic chronic kidney disease: Secondary | ICD-10-CM | POA: Diagnosis not present

## 2016-09-08 DIAGNOSIS — R531 Weakness: Secondary | ICD-10-CM | POA: Diagnosis not present

## 2016-09-08 DIAGNOSIS — I132 Hypertensive heart and chronic kidney disease with heart failure and with stage 5 chronic kidney disease, or end stage renal disease: Secondary | ICD-10-CM | POA: Diagnosis not present

## 2016-09-08 DIAGNOSIS — I890 Lymphedema, not elsewhere classified: Secondary | ICD-10-CM | POA: Diagnosis not present

## 2016-09-09 DIAGNOSIS — N2581 Secondary hyperparathyroidism of renal origin: Secondary | ICD-10-CM | POA: Diagnosis not present

## 2016-09-09 DIAGNOSIS — N186 End stage renal disease: Secondary | ICD-10-CM | POA: Diagnosis not present

## 2016-09-09 DIAGNOSIS — D509 Iron deficiency anemia, unspecified: Secondary | ICD-10-CM | POA: Diagnosis not present

## 2016-09-09 DIAGNOSIS — Z23 Encounter for immunization: Secondary | ICD-10-CM | POA: Diagnosis not present

## 2016-09-09 DIAGNOSIS — R7881 Bacteremia: Secondary | ICD-10-CM | POA: Diagnosis not present

## 2016-09-09 DIAGNOSIS — D631 Anemia in chronic kidney disease: Secondary | ICD-10-CM | POA: Diagnosis not present

## 2016-09-11 DIAGNOSIS — N186 End stage renal disease: Secondary | ICD-10-CM | POA: Diagnosis not present

## 2016-09-11 DIAGNOSIS — D631 Anemia in chronic kidney disease: Secondary | ICD-10-CM | POA: Diagnosis not present

## 2016-09-11 DIAGNOSIS — Z23 Encounter for immunization: Secondary | ICD-10-CM | POA: Diagnosis not present

## 2016-09-11 DIAGNOSIS — D509 Iron deficiency anemia, unspecified: Secondary | ICD-10-CM | POA: Diagnosis not present

## 2016-09-11 DIAGNOSIS — N2581 Secondary hyperparathyroidism of renal origin: Secondary | ICD-10-CM | POA: Diagnosis not present

## 2016-09-11 DIAGNOSIS — R7881 Bacteremia: Secondary | ICD-10-CM | POA: Diagnosis not present

## 2016-09-13 DIAGNOSIS — E1122 Type 2 diabetes mellitus with diabetic chronic kidney disease: Secondary | ICD-10-CM | POA: Diagnosis not present

## 2016-09-13 DIAGNOSIS — I132 Hypertensive heart and chronic kidney disease with heart failure and with stage 5 chronic kidney disease, or end stage renal disease: Secondary | ICD-10-CM | POA: Diagnosis not present

## 2016-09-13 DIAGNOSIS — R531 Weakness: Secondary | ICD-10-CM | POA: Diagnosis not present

## 2016-09-13 DIAGNOSIS — I5032 Chronic diastolic (congestive) heart failure: Secondary | ICD-10-CM | POA: Diagnosis not present

## 2016-09-13 DIAGNOSIS — N186 End stage renal disease: Secondary | ICD-10-CM | POA: Diagnosis not present

## 2016-09-13 DIAGNOSIS — I890 Lymphedema, not elsewhere classified: Secondary | ICD-10-CM | POA: Diagnosis not present

## 2016-09-14 DIAGNOSIS — D509 Iron deficiency anemia, unspecified: Secondary | ICD-10-CM | POA: Diagnosis not present

## 2016-09-14 DIAGNOSIS — R7881 Bacteremia: Secondary | ICD-10-CM | POA: Diagnosis not present

## 2016-09-14 DIAGNOSIS — N186 End stage renal disease: Secondary | ICD-10-CM | POA: Diagnosis not present

## 2016-09-14 DIAGNOSIS — D631 Anemia in chronic kidney disease: Secondary | ICD-10-CM | POA: Diagnosis not present

## 2016-09-14 DIAGNOSIS — N2581 Secondary hyperparathyroidism of renal origin: Secondary | ICD-10-CM | POA: Diagnosis not present

## 2016-09-14 DIAGNOSIS — Z23 Encounter for immunization: Secondary | ICD-10-CM | POA: Diagnosis not present

## 2016-09-16 DIAGNOSIS — Z23 Encounter for immunization: Secondary | ICD-10-CM | POA: Diagnosis not present

## 2016-09-16 DIAGNOSIS — R7881 Bacteremia: Secondary | ICD-10-CM | POA: Diagnosis not present

## 2016-09-16 DIAGNOSIS — N2581 Secondary hyperparathyroidism of renal origin: Secondary | ICD-10-CM | POA: Diagnosis not present

## 2016-09-16 DIAGNOSIS — D509 Iron deficiency anemia, unspecified: Secondary | ICD-10-CM | POA: Diagnosis not present

## 2016-09-16 DIAGNOSIS — N186 End stage renal disease: Secondary | ICD-10-CM | POA: Diagnosis not present

## 2016-09-16 DIAGNOSIS — D631 Anemia in chronic kidney disease: Secondary | ICD-10-CM | POA: Diagnosis not present

## 2016-09-18 DIAGNOSIS — R7881 Bacteremia: Secondary | ICD-10-CM | POA: Diagnosis not present

## 2016-09-18 DIAGNOSIS — Z23 Encounter for immunization: Secondary | ICD-10-CM | POA: Diagnosis not present

## 2016-09-18 DIAGNOSIS — N2581 Secondary hyperparathyroidism of renal origin: Secondary | ICD-10-CM | POA: Diagnosis not present

## 2016-09-18 DIAGNOSIS — D509 Iron deficiency anemia, unspecified: Secondary | ICD-10-CM | POA: Diagnosis not present

## 2016-09-18 DIAGNOSIS — Z992 Dependence on renal dialysis: Secondary | ICD-10-CM | POA: Diagnosis not present

## 2016-09-18 DIAGNOSIS — D631 Anemia in chronic kidney disease: Secondary | ICD-10-CM | POA: Diagnosis not present

## 2016-09-18 DIAGNOSIS — N186 End stage renal disease: Secondary | ICD-10-CM | POA: Diagnosis not present

## 2016-09-20 DIAGNOSIS — D631 Anemia in chronic kidney disease: Secondary | ICD-10-CM | POA: Diagnosis not present

## 2016-09-20 DIAGNOSIS — N186 End stage renal disease: Secondary | ICD-10-CM | POA: Diagnosis not present

## 2016-09-20 DIAGNOSIS — Z992 Dependence on renal dialysis: Secondary | ICD-10-CM | POA: Diagnosis not present

## 2016-09-20 DIAGNOSIS — N2581 Secondary hyperparathyroidism of renal origin: Secondary | ICD-10-CM | POA: Diagnosis not present

## 2016-09-20 DIAGNOSIS — D509 Iron deficiency anemia, unspecified: Secondary | ICD-10-CM | POA: Diagnosis not present

## 2016-09-21 DIAGNOSIS — I890 Lymphedema, not elsewhere classified: Secondary | ICD-10-CM | POA: Diagnosis not present

## 2016-09-21 DIAGNOSIS — N186 End stage renal disease: Secondary | ICD-10-CM | POA: Diagnosis not present

## 2016-09-21 DIAGNOSIS — E1122 Type 2 diabetes mellitus with diabetic chronic kidney disease: Secondary | ICD-10-CM | POA: Diagnosis not present

## 2016-09-21 DIAGNOSIS — R531 Weakness: Secondary | ICD-10-CM | POA: Diagnosis not present

## 2016-09-21 DIAGNOSIS — I132 Hypertensive heart and chronic kidney disease with heart failure and with stage 5 chronic kidney disease, or end stage renal disease: Secondary | ICD-10-CM | POA: Diagnosis not present

## 2016-09-21 DIAGNOSIS — I5032 Chronic diastolic (congestive) heart failure: Secondary | ICD-10-CM | POA: Diagnosis not present

## 2016-09-22 DIAGNOSIS — N2581 Secondary hyperparathyroidism of renal origin: Secondary | ICD-10-CM | POA: Diagnosis not present

## 2016-09-22 DIAGNOSIS — D631 Anemia in chronic kidney disease: Secondary | ICD-10-CM | POA: Diagnosis not present

## 2016-09-22 DIAGNOSIS — Z992 Dependence on renal dialysis: Secondary | ICD-10-CM | POA: Diagnosis not present

## 2016-09-22 DIAGNOSIS — D509 Iron deficiency anemia, unspecified: Secondary | ICD-10-CM | POA: Diagnosis not present

## 2016-09-22 DIAGNOSIS — N186 End stage renal disease: Secondary | ICD-10-CM | POA: Diagnosis not present

## 2016-09-24 DIAGNOSIS — Z992 Dependence on renal dialysis: Secondary | ICD-10-CM | POA: Diagnosis not present

## 2016-09-24 DIAGNOSIS — N2581 Secondary hyperparathyroidism of renal origin: Secondary | ICD-10-CM | POA: Diagnosis not present

## 2016-09-24 DIAGNOSIS — D509 Iron deficiency anemia, unspecified: Secondary | ICD-10-CM | POA: Diagnosis not present

## 2016-09-24 DIAGNOSIS — D631 Anemia in chronic kidney disease: Secondary | ICD-10-CM | POA: Diagnosis not present

## 2016-09-24 DIAGNOSIS — N186 End stage renal disease: Secondary | ICD-10-CM | POA: Diagnosis not present

## 2016-09-27 DIAGNOSIS — N186 End stage renal disease: Secondary | ICD-10-CM | POA: Diagnosis not present

## 2016-09-27 DIAGNOSIS — D631 Anemia in chronic kidney disease: Secondary | ICD-10-CM | POA: Diagnosis not present

## 2016-09-27 DIAGNOSIS — N2581 Secondary hyperparathyroidism of renal origin: Secondary | ICD-10-CM | POA: Diagnosis not present

## 2016-09-27 DIAGNOSIS — D509 Iron deficiency anemia, unspecified: Secondary | ICD-10-CM | POA: Diagnosis not present

## 2016-09-27 DIAGNOSIS — Z992 Dependence on renal dialysis: Secondary | ICD-10-CM | POA: Diagnosis not present

## 2016-09-28 DIAGNOSIS — E1122 Type 2 diabetes mellitus with diabetic chronic kidney disease: Secondary | ICD-10-CM | POA: Diagnosis not present

## 2016-09-28 DIAGNOSIS — N186 End stage renal disease: Secondary | ICD-10-CM | POA: Diagnosis not present

## 2016-09-28 DIAGNOSIS — R531 Weakness: Secondary | ICD-10-CM | POA: Diagnosis not present

## 2016-09-28 DIAGNOSIS — I5032 Chronic diastolic (congestive) heart failure: Secondary | ICD-10-CM | POA: Diagnosis not present

## 2016-09-28 DIAGNOSIS — I890 Lymphedema, not elsewhere classified: Secondary | ICD-10-CM | POA: Diagnosis not present

## 2016-09-28 DIAGNOSIS — I132 Hypertensive heart and chronic kidney disease with heart failure and with stage 5 chronic kidney disease, or end stage renal disease: Secondary | ICD-10-CM | POA: Diagnosis not present

## 2016-09-29 DIAGNOSIS — D631 Anemia in chronic kidney disease: Secondary | ICD-10-CM | POA: Diagnosis not present

## 2016-09-29 DIAGNOSIS — N2581 Secondary hyperparathyroidism of renal origin: Secondary | ICD-10-CM | POA: Diagnosis not present

## 2016-09-29 DIAGNOSIS — Z992 Dependence on renal dialysis: Secondary | ICD-10-CM | POA: Diagnosis not present

## 2016-09-29 DIAGNOSIS — D509 Iron deficiency anemia, unspecified: Secondary | ICD-10-CM | POA: Diagnosis not present

## 2016-09-29 DIAGNOSIS — N186 End stage renal disease: Secondary | ICD-10-CM | POA: Diagnosis not present

## 2016-09-30 DIAGNOSIS — R531 Weakness: Secondary | ICD-10-CM | POA: Diagnosis not present

## 2016-09-30 DIAGNOSIS — I5032 Chronic diastolic (congestive) heart failure: Secondary | ICD-10-CM | POA: Diagnosis not present

## 2016-09-30 DIAGNOSIS — E1122 Type 2 diabetes mellitus with diabetic chronic kidney disease: Secondary | ICD-10-CM | POA: Diagnosis not present

## 2016-09-30 DIAGNOSIS — I890 Lymphedema, not elsewhere classified: Secondary | ICD-10-CM | POA: Diagnosis not present

## 2016-09-30 DIAGNOSIS — I132 Hypertensive heart and chronic kidney disease with heart failure and with stage 5 chronic kidney disease, or end stage renal disease: Secondary | ICD-10-CM | POA: Diagnosis not present

## 2016-09-30 DIAGNOSIS — N186 End stage renal disease: Secondary | ICD-10-CM | POA: Diagnosis not present

## 2016-10-01 DIAGNOSIS — N2581 Secondary hyperparathyroidism of renal origin: Secondary | ICD-10-CM | POA: Diagnosis not present

## 2016-10-01 DIAGNOSIS — Z992 Dependence on renal dialysis: Secondary | ICD-10-CM | POA: Diagnosis not present

## 2016-10-01 DIAGNOSIS — D509 Iron deficiency anemia, unspecified: Secondary | ICD-10-CM | POA: Diagnosis not present

## 2016-10-01 DIAGNOSIS — D631 Anemia in chronic kidney disease: Secondary | ICD-10-CM | POA: Diagnosis not present

## 2016-10-01 DIAGNOSIS — N186 End stage renal disease: Secondary | ICD-10-CM | POA: Diagnosis not present

## 2016-10-04 DIAGNOSIS — N2581 Secondary hyperparathyroidism of renal origin: Secondary | ICD-10-CM | POA: Diagnosis not present

## 2016-10-04 DIAGNOSIS — D509 Iron deficiency anemia, unspecified: Secondary | ICD-10-CM | POA: Diagnosis not present

## 2016-10-04 DIAGNOSIS — Z992 Dependence on renal dialysis: Secondary | ICD-10-CM | POA: Diagnosis not present

## 2016-10-04 DIAGNOSIS — D631 Anemia in chronic kidney disease: Secondary | ICD-10-CM | POA: Diagnosis not present

## 2016-10-04 DIAGNOSIS — N186 End stage renal disease: Secondary | ICD-10-CM | POA: Diagnosis not present

## 2016-10-05 DIAGNOSIS — I132 Hypertensive heart and chronic kidney disease with heart failure and with stage 5 chronic kidney disease, or end stage renal disease: Secondary | ICD-10-CM | POA: Diagnosis not present

## 2016-10-05 DIAGNOSIS — I890 Lymphedema, not elsewhere classified: Secondary | ICD-10-CM | POA: Diagnosis not present

## 2016-10-05 DIAGNOSIS — R531 Weakness: Secondary | ICD-10-CM | POA: Diagnosis not present

## 2016-10-05 DIAGNOSIS — N186 End stage renal disease: Secondary | ICD-10-CM | POA: Diagnosis not present

## 2016-10-05 DIAGNOSIS — I5032 Chronic diastolic (congestive) heart failure: Secondary | ICD-10-CM | POA: Diagnosis not present

## 2016-10-05 DIAGNOSIS — E1122 Type 2 diabetes mellitus with diabetic chronic kidney disease: Secondary | ICD-10-CM | POA: Diagnosis not present

## 2016-10-06 DIAGNOSIS — Z992 Dependence on renal dialysis: Secondary | ICD-10-CM | POA: Diagnosis not present

## 2016-10-06 DIAGNOSIS — D509 Iron deficiency anemia, unspecified: Secondary | ICD-10-CM | POA: Diagnosis not present

## 2016-10-06 DIAGNOSIS — N2581 Secondary hyperparathyroidism of renal origin: Secondary | ICD-10-CM | POA: Diagnosis not present

## 2016-10-06 DIAGNOSIS — N186 End stage renal disease: Secondary | ICD-10-CM | POA: Diagnosis not present

## 2016-10-06 DIAGNOSIS — D631 Anemia in chronic kidney disease: Secondary | ICD-10-CM | POA: Diagnosis not present

## 2016-10-08 DIAGNOSIS — Z992 Dependence on renal dialysis: Secondary | ICD-10-CM | POA: Diagnosis not present

## 2016-10-08 DIAGNOSIS — N186 End stage renal disease: Secondary | ICD-10-CM | POA: Diagnosis not present

## 2016-10-08 DIAGNOSIS — D509 Iron deficiency anemia, unspecified: Secondary | ICD-10-CM | POA: Diagnosis not present

## 2016-10-08 DIAGNOSIS — N2581 Secondary hyperparathyroidism of renal origin: Secondary | ICD-10-CM | POA: Diagnosis not present

## 2016-10-08 DIAGNOSIS — D631 Anemia in chronic kidney disease: Secondary | ICD-10-CM | POA: Diagnosis not present

## 2016-10-11 DIAGNOSIS — N186 End stage renal disease: Secondary | ICD-10-CM | POA: Diagnosis not present

## 2016-10-11 DIAGNOSIS — D631 Anemia in chronic kidney disease: Secondary | ICD-10-CM | POA: Diagnosis not present

## 2016-10-11 DIAGNOSIS — Z992 Dependence on renal dialysis: Secondary | ICD-10-CM | POA: Diagnosis not present

## 2016-10-11 DIAGNOSIS — D509 Iron deficiency anemia, unspecified: Secondary | ICD-10-CM | POA: Diagnosis not present

## 2016-10-11 DIAGNOSIS — N2581 Secondary hyperparathyroidism of renal origin: Secondary | ICD-10-CM | POA: Diagnosis not present

## 2016-10-12 DIAGNOSIS — I890 Lymphedema, not elsewhere classified: Secondary | ICD-10-CM | POA: Diagnosis not present

## 2016-10-12 DIAGNOSIS — E1122 Type 2 diabetes mellitus with diabetic chronic kidney disease: Secondary | ICD-10-CM | POA: Diagnosis not present

## 2016-10-12 DIAGNOSIS — I132 Hypertensive heart and chronic kidney disease with heart failure and with stage 5 chronic kidney disease, or end stage renal disease: Secondary | ICD-10-CM | POA: Diagnosis not present

## 2016-10-12 DIAGNOSIS — N186 End stage renal disease: Secondary | ICD-10-CM | POA: Diagnosis not present

## 2016-10-12 DIAGNOSIS — I5032 Chronic diastolic (congestive) heart failure: Secondary | ICD-10-CM | POA: Diagnosis not present

## 2016-10-12 DIAGNOSIS — R531 Weakness: Secondary | ICD-10-CM | POA: Diagnosis not present

## 2016-10-13 DIAGNOSIS — D631 Anemia in chronic kidney disease: Secondary | ICD-10-CM | POA: Diagnosis not present

## 2016-10-13 DIAGNOSIS — D509 Iron deficiency anemia, unspecified: Secondary | ICD-10-CM | POA: Diagnosis not present

## 2016-10-13 DIAGNOSIS — N2581 Secondary hyperparathyroidism of renal origin: Secondary | ICD-10-CM | POA: Diagnosis not present

## 2016-10-13 DIAGNOSIS — Z992 Dependence on renal dialysis: Secondary | ICD-10-CM | POA: Diagnosis not present

## 2016-10-13 DIAGNOSIS — N186 End stage renal disease: Secondary | ICD-10-CM | POA: Diagnosis not present

## 2016-10-14 DIAGNOSIS — I5032 Chronic diastolic (congestive) heart failure: Secondary | ICD-10-CM | POA: Diagnosis not present

## 2016-10-14 DIAGNOSIS — I890 Lymphedema, not elsewhere classified: Secondary | ICD-10-CM | POA: Diagnosis not present

## 2016-10-14 DIAGNOSIS — I132 Hypertensive heart and chronic kidney disease with heart failure and with stage 5 chronic kidney disease, or end stage renal disease: Secondary | ICD-10-CM | POA: Diagnosis not present

## 2016-10-14 DIAGNOSIS — E1122 Type 2 diabetes mellitus with diabetic chronic kidney disease: Secondary | ICD-10-CM | POA: Diagnosis not present

## 2016-10-14 DIAGNOSIS — R531 Weakness: Secondary | ICD-10-CM | POA: Diagnosis not present

## 2016-10-14 DIAGNOSIS — N186 End stage renal disease: Secondary | ICD-10-CM | POA: Diagnosis not present

## 2016-10-15 DIAGNOSIS — D509 Iron deficiency anemia, unspecified: Secondary | ICD-10-CM | POA: Diagnosis not present

## 2016-10-15 DIAGNOSIS — D631 Anemia in chronic kidney disease: Secondary | ICD-10-CM | POA: Diagnosis not present

## 2016-10-15 DIAGNOSIS — N186 End stage renal disease: Secondary | ICD-10-CM | POA: Diagnosis not present

## 2016-10-15 DIAGNOSIS — Z992 Dependence on renal dialysis: Secondary | ICD-10-CM | POA: Diagnosis not present

## 2016-10-15 DIAGNOSIS — N2581 Secondary hyperparathyroidism of renal origin: Secondary | ICD-10-CM | POA: Diagnosis not present

## 2016-10-18 DIAGNOSIS — N186 End stage renal disease: Secondary | ICD-10-CM | POA: Diagnosis not present

## 2016-10-18 DIAGNOSIS — Z992 Dependence on renal dialysis: Secondary | ICD-10-CM | POA: Diagnosis not present

## 2016-10-18 DIAGNOSIS — D631 Anemia in chronic kidney disease: Secondary | ICD-10-CM | POA: Diagnosis not present

## 2016-10-18 DIAGNOSIS — D509 Iron deficiency anemia, unspecified: Secondary | ICD-10-CM | POA: Diagnosis not present

## 2016-10-18 DIAGNOSIS — N2581 Secondary hyperparathyroidism of renal origin: Secondary | ICD-10-CM | POA: Diagnosis not present

## 2016-10-19 DIAGNOSIS — Z992 Dependence on renal dialysis: Secondary | ICD-10-CM | POA: Diagnosis not present

## 2016-10-19 DIAGNOSIS — N186 End stage renal disease: Secondary | ICD-10-CM | POA: Diagnosis not present

## 2016-10-20 DIAGNOSIS — N186 End stage renal disease: Secondary | ICD-10-CM | POA: Diagnosis not present

## 2016-10-20 DIAGNOSIS — I132 Hypertensive heart and chronic kidney disease with heart failure and with stage 5 chronic kidney disease, or end stage renal disease: Secondary | ICD-10-CM | POA: Diagnosis not present

## 2016-10-20 DIAGNOSIS — E1122 Type 2 diabetes mellitus with diabetic chronic kidney disease: Secondary | ICD-10-CM | POA: Diagnosis not present

## 2016-10-20 DIAGNOSIS — D631 Anemia in chronic kidney disease: Secondary | ICD-10-CM | POA: Diagnosis not present

## 2016-10-20 DIAGNOSIS — D509 Iron deficiency anemia, unspecified: Secondary | ICD-10-CM | POA: Diagnosis not present

## 2016-10-20 DIAGNOSIS — I890 Lymphedema, not elsewhere classified: Secondary | ICD-10-CM | POA: Diagnosis not present

## 2016-10-20 DIAGNOSIS — R110 Nausea: Secondary | ICD-10-CM | POA: Diagnosis not present

## 2016-10-20 DIAGNOSIS — Z992 Dependence on renal dialysis: Secondary | ICD-10-CM | POA: Diagnosis not present

## 2016-10-20 DIAGNOSIS — R1110 Vomiting, unspecified: Secondary | ICD-10-CM | POA: Diagnosis not present

## 2016-10-20 DIAGNOSIS — I5032 Chronic diastolic (congestive) heart failure: Secondary | ICD-10-CM | POA: Diagnosis not present

## 2016-10-20 DIAGNOSIS — R531 Weakness: Secondary | ICD-10-CM | POA: Diagnosis not present

## 2016-10-20 DIAGNOSIS — N2581 Secondary hyperparathyroidism of renal origin: Secondary | ICD-10-CM | POA: Diagnosis not present

## 2016-10-22 DIAGNOSIS — D509 Iron deficiency anemia, unspecified: Secondary | ICD-10-CM | POA: Diagnosis not present

## 2016-10-22 DIAGNOSIS — R110 Nausea: Secondary | ICD-10-CM | POA: Diagnosis not present

## 2016-10-22 DIAGNOSIS — N2581 Secondary hyperparathyroidism of renal origin: Secondary | ICD-10-CM | POA: Diagnosis not present

## 2016-10-22 DIAGNOSIS — D631 Anemia in chronic kidney disease: Secondary | ICD-10-CM | POA: Diagnosis not present

## 2016-10-22 DIAGNOSIS — N186 End stage renal disease: Secondary | ICD-10-CM | POA: Diagnosis not present

## 2016-10-22 DIAGNOSIS — Z992 Dependence on renal dialysis: Secondary | ICD-10-CM | POA: Diagnosis not present

## 2016-10-25 DIAGNOSIS — D509 Iron deficiency anemia, unspecified: Secondary | ICD-10-CM | POA: Diagnosis not present

## 2016-10-25 DIAGNOSIS — D631 Anemia in chronic kidney disease: Secondary | ICD-10-CM | POA: Diagnosis not present

## 2016-10-25 DIAGNOSIS — N186 End stage renal disease: Secondary | ICD-10-CM | POA: Diagnosis not present

## 2016-10-25 DIAGNOSIS — Z992 Dependence on renal dialysis: Secondary | ICD-10-CM | POA: Diagnosis not present

## 2016-10-25 DIAGNOSIS — N2581 Secondary hyperparathyroidism of renal origin: Secondary | ICD-10-CM | POA: Diagnosis not present

## 2016-10-25 DIAGNOSIS — R110 Nausea: Secondary | ICD-10-CM | POA: Diagnosis not present

## 2016-10-27 DIAGNOSIS — R110 Nausea: Secondary | ICD-10-CM | POA: Diagnosis not present

## 2016-10-27 DIAGNOSIS — N2581 Secondary hyperparathyroidism of renal origin: Secondary | ICD-10-CM | POA: Diagnosis not present

## 2016-10-27 DIAGNOSIS — D509 Iron deficiency anemia, unspecified: Secondary | ICD-10-CM | POA: Diagnosis not present

## 2016-10-27 DIAGNOSIS — D631 Anemia in chronic kidney disease: Secondary | ICD-10-CM | POA: Diagnosis not present

## 2016-10-27 DIAGNOSIS — N186 End stage renal disease: Secondary | ICD-10-CM | POA: Diagnosis not present

## 2016-10-27 DIAGNOSIS — Z992 Dependence on renal dialysis: Secondary | ICD-10-CM | POA: Diagnosis not present

## 2016-10-29 DIAGNOSIS — N186 End stage renal disease: Secondary | ICD-10-CM | POA: Diagnosis not present

## 2016-10-29 DIAGNOSIS — D509 Iron deficiency anemia, unspecified: Secondary | ICD-10-CM | POA: Diagnosis not present

## 2016-10-29 DIAGNOSIS — D631 Anemia in chronic kidney disease: Secondary | ICD-10-CM | POA: Diagnosis not present

## 2016-10-29 DIAGNOSIS — Z992 Dependence on renal dialysis: Secondary | ICD-10-CM | POA: Diagnosis not present

## 2016-10-29 DIAGNOSIS — N2581 Secondary hyperparathyroidism of renal origin: Secondary | ICD-10-CM | POA: Diagnosis not present

## 2016-10-29 DIAGNOSIS — R110 Nausea: Secondary | ICD-10-CM | POA: Diagnosis not present

## 2016-11-01 DIAGNOSIS — N2581 Secondary hyperparathyroidism of renal origin: Secondary | ICD-10-CM | POA: Diagnosis not present

## 2016-11-01 DIAGNOSIS — R110 Nausea: Secondary | ICD-10-CM | POA: Diagnosis not present

## 2016-11-01 DIAGNOSIS — D631 Anemia in chronic kidney disease: Secondary | ICD-10-CM | POA: Diagnosis not present

## 2016-11-01 DIAGNOSIS — D509 Iron deficiency anemia, unspecified: Secondary | ICD-10-CM | POA: Diagnosis not present

## 2016-11-01 DIAGNOSIS — Z992 Dependence on renal dialysis: Secondary | ICD-10-CM | POA: Diagnosis not present

## 2016-11-01 DIAGNOSIS — N186 End stage renal disease: Secondary | ICD-10-CM | POA: Diagnosis not present

## 2016-11-03 DIAGNOSIS — N186 End stage renal disease: Secondary | ICD-10-CM | POA: Diagnosis not present

## 2016-11-03 DIAGNOSIS — Z992 Dependence on renal dialysis: Secondary | ICD-10-CM | POA: Diagnosis not present

## 2016-11-03 DIAGNOSIS — D631 Anemia in chronic kidney disease: Secondary | ICD-10-CM | POA: Diagnosis not present

## 2016-11-03 DIAGNOSIS — N2581 Secondary hyperparathyroidism of renal origin: Secondary | ICD-10-CM | POA: Diagnosis not present

## 2016-11-03 DIAGNOSIS — D509 Iron deficiency anemia, unspecified: Secondary | ICD-10-CM | POA: Diagnosis not present

## 2016-11-03 DIAGNOSIS — R110 Nausea: Secondary | ICD-10-CM | POA: Diagnosis not present

## 2016-11-05 DIAGNOSIS — D509 Iron deficiency anemia, unspecified: Secondary | ICD-10-CM | POA: Diagnosis not present

## 2016-11-05 DIAGNOSIS — D631 Anemia in chronic kidney disease: Secondary | ICD-10-CM | POA: Diagnosis not present

## 2016-11-05 DIAGNOSIS — R110 Nausea: Secondary | ICD-10-CM | POA: Diagnosis not present

## 2016-11-05 DIAGNOSIS — N2581 Secondary hyperparathyroidism of renal origin: Secondary | ICD-10-CM | POA: Diagnosis not present

## 2016-11-05 DIAGNOSIS — N186 End stage renal disease: Secondary | ICD-10-CM | POA: Diagnosis not present

## 2016-11-05 DIAGNOSIS — Z992 Dependence on renal dialysis: Secondary | ICD-10-CM | POA: Diagnosis not present

## 2016-11-08 DIAGNOSIS — R110 Nausea: Secondary | ICD-10-CM | POA: Diagnosis not present

## 2016-11-08 DIAGNOSIS — D631 Anemia in chronic kidney disease: Secondary | ICD-10-CM | POA: Diagnosis not present

## 2016-11-08 DIAGNOSIS — Z992 Dependence on renal dialysis: Secondary | ICD-10-CM | POA: Diagnosis not present

## 2016-11-08 DIAGNOSIS — N186 End stage renal disease: Secondary | ICD-10-CM | POA: Diagnosis not present

## 2016-11-08 DIAGNOSIS — N2581 Secondary hyperparathyroidism of renal origin: Secondary | ICD-10-CM | POA: Diagnosis not present

## 2016-11-08 DIAGNOSIS — D509 Iron deficiency anemia, unspecified: Secondary | ICD-10-CM | POA: Diagnosis not present

## 2016-11-10 DIAGNOSIS — D631 Anemia in chronic kidney disease: Secondary | ICD-10-CM | POA: Diagnosis not present

## 2016-11-10 DIAGNOSIS — D509 Iron deficiency anemia, unspecified: Secondary | ICD-10-CM | POA: Diagnosis not present

## 2016-11-10 DIAGNOSIS — N2581 Secondary hyperparathyroidism of renal origin: Secondary | ICD-10-CM | POA: Diagnosis not present

## 2016-11-10 DIAGNOSIS — Z992 Dependence on renal dialysis: Secondary | ICD-10-CM | POA: Diagnosis not present

## 2016-11-10 DIAGNOSIS — N186 End stage renal disease: Secondary | ICD-10-CM | POA: Diagnosis not present

## 2016-11-10 DIAGNOSIS — R110 Nausea: Secondary | ICD-10-CM | POA: Diagnosis not present

## 2016-11-12 DIAGNOSIS — R110 Nausea: Secondary | ICD-10-CM | POA: Diagnosis not present

## 2016-11-12 DIAGNOSIS — N2581 Secondary hyperparathyroidism of renal origin: Secondary | ICD-10-CM | POA: Diagnosis not present

## 2016-11-12 DIAGNOSIS — N186 End stage renal disease: Secondary | ICD-10-CM | POA: Diagnosis not present

## 2016-11-12 DIAGNOSIS — D509 Iron deficiency anemia, unspecified: Secondary | ICD-10-CM | POA: Diagnosis not present

## 2016-11-12 DIAGNOSIS — Z992 Dependence on renal dialysis: Secondary | ICD-10-CM | POA: Diagnosis not present

## 2016-11-12 DIAGNOSIS — D631 Anemia in chronic kidney disease: Secondary | ICD-10-CM | POA: Diagnosis not present

## 2016-11-15 DIAGNOSIS — D631 Anemia in chronic kidney disease: Secondary | ICD-10-CM | POA: Diagnosis not present

## 2016-11-15 DIAGNOSIS — N186 End stage renal disease: Secondary | ICD-10-CM | POA: Diagnosis not present

## 2016-11-15 DIAGNOSIS — Z992 Dependence on renal dialysis: Secondary | ICD-10-CM | POA: Diagnosis not present

## 2016-11-15 DIAGNOSIS — N2581 Secondary hyperparathyroidism of renal origin: Secondary | ICD-10-CM | POA: Diagnosis not present

## 2016-11-15 DIAGNOSIS — R110 Nausea: Secondary | ICD-10-CM | POA: Diagnosis not present

## 2016-11-15 DIAGNOSIS — D509 Iron deficiency anemia, unspecified: Secondary | ICD-10-CM | POA: Diagnosis not present

## 2016-11-17 DIAGNOSIS — N186 End stage renal disease: Secondary | ICD-10-CM | POA: Diagnosis not present

## 2016-11-17 DIAGNOSIS — Z992 Dependence on renal dialysis: Secondary | ICD-10-CM | POA: Diagnosis not present

## 2016-11-17 DIAGNOSIS — R110 Nausea: Secondary | ICD-10-CM | POA: Diagnosis not present

## 2016-11-17 DIAGNOSIS — N2581 Secondary hyperparathyroidism of renal origin: Secondary | ICD-10-CM | POA: Diagnosis not present

## 2016-11-17 DIAGNOSIS — D631 Anemia in chronic kidney disease: Secondary | ICD-10-CM | POA: Diagnosis not present

## 2016-11-17 DIAGNOSIS — D509 Iron deficiency anemia, unspecified: Secondary | ICD-10-CM | POA: Diagnosis not present

## 2016-11-18 DIAGNOSIS — Z992 Dependence on renal dialysis: Secondary | ICD-10-CM | POA: Diagnosis not present

## 2016-11-18 DIAGNOSIS — N186 End stage renal disease: Secondary | ICD-10-CM | POA: Diagnosis not present

## 2016-11-19 ENCOUNTER — Other Ambulatory Visit: Payer: Self-pay

## 2016-11-19 DIAGNOSIS — N2581 Secondary hyperparathyroidism of renal origin: Secondary | ICD-10-CM | POA: Diagnosis not present

## 2016-11-19 DIAGNOSIS — R110 Nausea: Secondary | ICD-10-CM | POA: Diagnosis not present

## 2016-11-19 DIAGNOSIS — D509 Iron deficiency anemia, unspecified: Secondary | ICD-10-CM | POA: Diagnosis not present

## 2016-11-19 DIAGNOSIS — R1110 Vomiting, unspecified: Secondary | ICD-10-CM | POA: Diagnosis not present

## 2016-11-19 DIAGNOSIS — Z992 Dependence on renal dialysis: Secondary | ICD-10-CM | POA: Diagnosis not present

## 2016-11-19 DIAGNOSIS — D631 Anemia in chronic kidney disease: Secondary | ICD-10-CM | POA: Diagnosis not present

## 2016-11-19 DIAGNOSIS — N186 End stage renal disease: Secondary | ICD-10-CM | POA: Diagnosis not present

## 2016-11-19 NOTE — Progress Notes (Signed)
rec'd request from Dr. Lowanda Foster for Fistulogram of left arm AVF, due to "high pitch bruit in AVF."  Fistulgram scheduled for Thursday, 11/25/16.  Instructions faxed to [facility].

## 2016-11-22 DIAGNOSIS — D631 Anemia in chronic kidney disease: Secondary | ICD-10-CM | POA: Diagnosis not present

## 2016-11-22 DIAGNOSIS — N2581 Secondary hyperparathyroidism of renal origin: Secondary | ICD-10-CM | POA: Diagnosis not present

## 2016-11-22 DIAGNOSIS — N186 End stage renal disease: Secondary | ICD-10-CM | POA: Diagnosis not present

## 2016-11-22 DIAGNOSIS — D509 Iron deficiency anemia, unspecified: Secondary | ICD-10-CM | POA: Diagnosis not present

## 2016-11-22 DIAGNOSIS — R110 Nausea: Secondary | ICD-10-CM | POA: Diagnosis not present

## 2016-11-22 DIAGNOSIS — Z992 Dependence on renal dialysis: Secondary | ICD-10-CM | POA: Diagnosis not present

## 2016-11-24 DIAGNOSIS — N186 End stage renal disease: Secondary | ICD-10-CM | POA: Diagnosis not present

## 2016-11-24 DIAGNOSIS — N2581 Secondary hyperparathyroidism of renal origin: Secondary | ICD-10-CM | POA: Diagnosis not present

## 2016-11-24 DIAGNOSIS — D509 Iron deficiency anemia, unspecified: Secondary | ICD-10-CM | POA: Diagnosis not present

## 2016-11-24 DIAGNOSIS — Z992 Dependence on renal dialysis: Secondary | ICD-10-CM | POA: Diagnosis not present

## 2016-11-24 DIAGNOSIS — D631 Anemia in chronic kidney disease: Secondary | ICD-10-CM | POA: Diagnosis not present

## 2016-11-24 DIAGNOSIS — R110 Nausea: Secondary | ICD-10-CM | POA: Diagnosis not present

## 2016-11-25 ENCOUNTER — Encounter (HOSPITAL_COMMUNITY): Payer: Self-pay | Admitting: Vascular Surgery

## 2016-11-25 ENCOUNTER — Ambulatory Visit (HOSPITAL_COMMUNITY)
Admission: RE | Admit: 2016-11-25 | Discharge: 2016-11-25 | Disposition: A | Discharge status: Home / Self Care | Payer: Medicare Other | Source: Ambulatory Visit | Attending: Vascular Surgery | Admitting: Vascular Surgery

## 2016-11-25 ENCOUNTER — Encounter (HOSPITAL_COMMUNITY)
Admission: RE | Disposition: A | Discharge status: Home / Self Care | Payer: Self-pay | Source: Ambulatory Visit | Attending: Vascular Surgery

## 2016-11-25 DIAGNOSIS — T82898A Other specified complication of vascular prosthetic devices, implants and grafts, initial encounter: Secondary | ICD-10-CM

## 2016-11-25 DIAGNOSIS — F419 Anxiety disorder, unspecified: Secondary | ICD-10-CM | POA: Insufficient documentation

## 2016-11-25 DIAGNOSIS — Z91018 Allergy to other foods: Secondary | ICD-10-CM | POA: Insufficient documentation

## 2016-11-25 DIAGNOSIS — Y828 Other medical devices associated with adverse incidents: Secondary | ICD-10-CM | POA: Insufficient documentation

## 2016-11-25 DIAGNOSIS — Z800 Family history of malignant neoplasm of digestive organs: Secondary | ICD-10-CM | POA: Insufficient documentation

## 2016-11-25 DIAGNOSIS — I132 Hypertensive heart and chronic kidney disease with heart failure and with stage 5 chronic kidney disease, or end stage renal disease: Secondary | ICD-10-CM | POA: Insufficient documentation

## 2016-11-25 DIAGNOSIS — Z9981 Dependence on supplemental oxygen: Secondary | ICD-10-CM | POA: Insufficient documentation

## 2016-11-25 DIAGNOSIS — Z8701 Personal history of pneumonia (recurrent): Secondary | ICD-10-CM | POA: Insufficient documentation

## 2016-11-25 DIAGNOSIS — I509 Heart failure, unspecified: Secondary | ICD-10-CM | POA: Insufficient documentation

## 2016-11-25 DIAGNOSIS — Z6841 Body Mass Index (BMI) 40.0 and over, adult: Secondary | ICD-10-CM | POA: Insufficient documentation

## 2016-11-25 DIAGNOSIS — E662 Morbid (severe) obesity with alveolar hypoventilation: Secondary | ICD-10-CM | POA: Insufficient documentation

## 2016-11-25 DIAGNOSIS — T82510A Breakdown (mechanical) of surgically created arteriovenous fistula, initial encounter: Secondary | ICD-10-CM | POA: Insufficient documentation

## 2016-11-25 DIAGNOSIS — E7800 Pure hypercholesterolemia, unspecified: Secondary | ICD-10-CM | POA: Insufficient documentation

## 2016-11-25 DIAGNOSIS — Z79891 Long term (current) use of opiate analgesic: Secondary | ICD-10-CM | POA: Insufficient documentation

## 2016-11-25 DIAGNOSIS — Z823 Family history of stroke: Secondary | ICD-10-CM | POA: Insufficient documentation

## 2016-11-25 DIAGNOSIS — Z8249 Family history of ischemic heart disease and other diseases of the circulatory system: Secondary | ICD-10-CM | POA: Insufficient documentation

## 2016-11-25 DIAGNOSIS — Z79899 Other long term (current) drug therapy: Secondary | ICD-10-CM | POA: Insufficient documentation

## 2016-11-25 DIAGNOSIS — Z91041 Radiographic dye allergy status: Secondary | ICD-10-CM | POA: Insufficient documentation

## 2016-11-25 DIAGNOSIS — Z992 Dependence on renal dialysis: Secondary | ICD-10-CM | POA: Insufficient documentation

## 2016-11-25 DIAGNOSIS — N186 End stage renal disease: Secondary | ICD-10-CM

## 2016-11-25 DIAGNOSIS — Z85850 Personal history of malignant neoplasm of thyroid: Secondary | ICD-10-CM | POA: Insufficient documentation

## 2016-11-25 DIAGNOSIS — E1122 Type 2 diabetes mellitus with diabetic chronic kidney disease: Secondary | ICD-10-CM | POA: Insufficient documentation

## 2016-11-25 DIAGNOSIS — Z887 Allergy status to serum and vaccine status: Secondary | ICD-10-CM | POA: Insufficient documentation

## 2016-11-25 DIAGNOSIS — Z881 Allergy status to other antibiotic agents status: Secondary | ICD-10-CM | POA: Insufficient documentation

## 2016-11-25 DIAGNOSIS — Z91013 Allergy to seafood: Secondary | ICD-10-CM | POA: Insufficient documentation

## 2016-11-25 DIAGNOSIS — Z90710 Acquired absence of both cervix and uterus: Secondary | ICD-10-CM | POA: Insufficient documentation

## 2016-11-25 HISTORY — PX: PERIPHERAL VASCULAR CATHETERIZATION: SHX172C

## 2016-11-25 LAB — POCT I-STAT, CHEM 8
BUN: 34 mg/dL — AB (ref 6–20)
CALCIUM ION: 1.22 mmol/L (ref 1.15–1.40)
CHLORIDE: 99 mmol/L — AB (ref 101–111)
CREATININE: 4.3 mg/dL — AB (ref 0.44–1.00)
Glucose, Bld: 155 mg/dL — ABNORMAL HIGH (ref 65–99)
HCT: 35 % — ABNORMAL LOW (ref 36.0–46.0)
Hemoglobin: 11.9 g/dL — ABNORMAL LOW (ref 12.0–15.0)
Potassium: 4 mmol/L (ref 3.5–5.1)
SODIUM: 139 mmol/L (ref 135–145)
TCO2: 28 mmol/L (ref 0–100)

## 2016-11-25 SURGERY — A/V FISTULAGRAM
Anesthesia: LOCAL | Laterality: Left

## 2016-11-25 MED ORDER — FAMOTIDINE IN NACL 20-0.9 MG/50ML-% IV SOLN
INTRAVENOUS | Status: AC
  Administered 2016-11-25: 20 mg
  Filled 2016-11-25: qty 50

## 2016-11-25 MED ORDER — SODIUM CHLORIDE 0.9% FLUSH: 3.0000 mL | INTRAVENOUS | Status: DC | PRN

## 2016-11-25 MED ORDER — SODIUM CHLORIDE 0.9% FLUSH: 3.0000 mL | Freq: Two times a day (BID) | INTRAVENOUS | Status: DC

## 2016-11-25 MED ORDER — LIDOCAINE HCL (PF) 1 % IJ SOLN
INTRAMUSCULAR | Status: AC
  Filled 2016-11-25: qty 30

## 2016-11-25 MED ORDER — SODIUM CHLORIDE 0.9 % IV SOLN: 250.0000 mL | INTRAVENOUS | Status: DC | PRN

## 2016-11-25 MED ORDER — LIDOCAINE HCL (PF) 1 % IJ SOLN
INTRAMUSCULAR | Status: DC | PRN
  Administered 2016-11-25: 2 mL

## 2016-11-25 MED ORDER — DIPHENHYDRAMINE HCL 50 MG/ML IJ SOLN
INTRAMUSCULAR | Status: AC
  Administered 2016-11-25: 25 mg
  Filled 2016-11-25: qty 1

## 2016-11-25 MED ORDER — IODIXANOL 320 MG/ML IV SOLN
INTRAVENOUS | Status: DC | PRN
  Administered 2016-11-25: 30 mL via INTRAVENOUS

## 2016-11-25 MED ORDER — HEPARIN (PORCINE) IN NACL 2-0.9 UNIT/ML-% IJ SOLN
INTRAMUSCULAR | Status: AC
  Filled 2016-11-25: qty 500

## 2016-11-25 MED ORDER — ACETAMINOPHEN 325 MG PO TABS: 650.0000 mg | ORAL_TABLET | ORAL | Status: DC | PRN

## 2016-11-25 MED ORDER — DIPHENHYDRAMINE HCL 50 MG/ML IJ SOLN: 25.0000 mg | INTRAMUSCULAR | Status: DC

## 2016-11-25 MED ORDER — HEPARIN (PORCINE) IN NACL 2-0.9 UNIT/ML-% IJ SOLN
INTRAMUSCULAR | Status: DC | PRN
  Administered 2016-11-25: 500 mL

## 2016-11-25 MED ORDER — METHYLPREDNISOLONE SODIUM SUCC 125 MG IJ SOLR: 125.0000 mg | INTRAMUSCULAR | Status: DC

## 2016-11-25 MED ORDER — FAMOTIDINE IN NACL 20-0.9 MG/50ML-% IV SOLN: 20.0000 mg | INTRAVENOUS | Status: DC

## 2016-11-25 MED ORDER — METHYLPREDNISOLONE SODIUM SUCC 125 MG IJ SOLR
INTRAMUSCULAR | Status: AC
  Administered 2016-11-25: 125 mg
  Filled 2016-11-25: qty 2

## 2016-11-25 SURGICAL SUPPLY — 11 items
BAG SNAP BAND KOVER 36X36 (MISCELLANEOUS) ×2 IMPLANT
COVER DOME SNAP 22 D (MISCELLANEOUS) ×2 IMPLANT
COVER PRB 48X5XTLSCP FOLD TPE (BAG) ×2 IMPLANT
COVER PROBE 5X48 (BAG) ×2
KIT MICROINTRODUCER STIFF 5F (SHEATH) ×2 IMPLANT
PROTECTION STATION PRESSURIZED (MISCELLANEOUS) ×2
STATION PROTECTION PRESSURIZED (MISCELLANEOUS) ×1 IMPLANT
STOPCOCK MORSE 400PSI 3WAY (MISCELLANEOUS) ×2 IMPLANT
TRAY PV CATH (CUSTOM PROCEDURE TRAY) ×2 IMPLANT
TUBING CIL FLEX 10 FLL-RA (TUBING) ×2 IMPLANT
WIRE TORQFLEX AUST .018X40CM (WIRE) ×2 IMPLANT

## 2016-11-25 NOTE — Op Note (Signed)
OPERATIVE NOTE   PROCEDURE: 1. left radiocephalic arteriovenous fistula cannulation under ultrasound guidance 2. left arm fistulogram  PRE-OPERATIVE DIAGNOSIS: Malfunctioning left radiocephalic arteriovenous fistula  POST-OPERATIVE DIAGNOSIS: same as above   SURGEON: Adele Barthel, MD  ANESTHESIA: local  ESTIMATED BLOOD LOSS: 5 cc  FINDING(S): 1. Widely patent fistula with co-dominant drainage for forearm via cephalic and brachial venous system 2. Widely patent central venous system  SPECIMEN(S):  None  CONTRAST: 30 cc  INDICATIONS: [name] is a 50 y.o. (February 05, 1966) female  presents with high pitch bruit in left radiocephalic arteriovenous fistula.  The patient is scheduled for left arm fistulogram.  The patient is aware the risks include but are not limited to: bleeding, infection, thrombosis of the cannulated access, and possible anaphylactic reaction to the contrast.  The patient is aware of the risks of the procedure and elects to proceed forward.  DESCRIPTION: After full informed written consent was obtained, the patient was brought back to the angiography suite and placed supine upon the angiography table.  Note, the patient had been premedicated with histamine blockers and IV solumedrol for prior history of contrast reaction.  The patient was connected to monitoring equipment.  The left fprearm was prepped and draped in the standard fashion for a right arm fistulogram.  Under ultrasound guidance, the left radiocephalic arteriovenous fistula was cannulated with a micropuncture needle.  The microwire was advanced into the fistula and the needle was exchanged for the a microsheath, which was lodged 2 cm into the access.  The wire was removed and the sheath was connected to the IV extension tubing.  Hand injections were completed to image the access from the antecubitum up to the level of superior vena cava.  The central venous structures were also imaged by hand  injections.  Based on the images, this patient will need: no intervention.  A 4-0 Monocryl purse-string suture was sewn around the sheath.  The sheath was removed while tying down the suture.  A sterile bandage was applied to the puncture site.   COMPLICATIONS: none  CONDITION: stable  Adele Barthel, MD [facility] Office: [phone] Pager: [phone]  11/25/2016 9:36 AM

## 2016-11-25 NOTE — H&P (Signed)
Brief History and Physical  History of Present Illness  [name] is a 50 y.o. female who presents with chief complaint: whistling sound in L forearm fistula.  The patient presents today for L arm fistulogram, possible intervention.    Past Medical History:  Diagnosis Date  . Anxiety   . Asthma    as a child  . Cancer ([place])    thyroid  . Cellulitis   . CHF (congestive heart failure) ([place])   . Chiari malformation    s/p surgery  . Chiari malformation   . Chronic pain   . Complication of anesthesia 11/28/15   Resp arrest after  conscious  sedation  . Constipation   . Coronary artery disease    40-50% mid LAD 04/29/09, Medical tx. (Dr. Gwenlyn Found)  . Diabetes mellitus    Type II  . Fibromyalgia   . History of blood transfusion    hemorrage duinrg pregancy  . Hx of echocardiogram 10/2011   EF 55-60%  . Hypercholesterolemia   . Hypertension   . Lymph edema   . Obesity hypoventilation syndrome ([place])   . On home O2   . Pneumonia    in past  . Renal disorder    Pt started dialysis in Dec.2016  . S/P colonoscopy 05/26/2007   Dr. Laural Golden sigmoid diverticulosis random biopsies benign  . S/P endoscopy 05/01/2009   Dr. Penelope Coop pill-induced esophageal ulcerations distal to midesophagus, 2 small ulcers in the antrum of the stomach  . Shortness of breath dyspnea    with any exertion or if heart rate  is irregular while on dialysis  . Sleep apnea     Past Surgical History:  Procedure Laterality Date  . Marland KitchenHemodialysis catheter Right 11/28/2015  . ABDOMINAL HYSTERECTOMY    . ADENOIDECTOMY    . AV FISTULA PLACEMENT Left 03/15/2016   Procedure: CREATION OF LEFT ARM ARTERIOVENOUS (AV) FISTULA  ;  Surgeon: Rosetta Posner, MD;  Location: [place];  Service: Vascular;  Laterality: Left;  . CESAREAN SECTION      x 2  . CRANIECTOMY SUBOCCIPITAL W/ CERVICAL LAMINECTOMY / CHIARI    . FISTULA SUPERFICIALIZATION Left 05/10/2016   Procedure: Left Arm FISTULA SUPERFICIALIZATION;  Surgeon: Rosetta Posner, MD;  Location: [facility] OR;  Service: Vascular;  Laterality: Left;  . IR GENERIC HISTORICAL  08/14/2016   IR REMOVAL TUN ACCESS W/ PORT W/O FL MOD SED 08/14/2016 Arne Cleveland, MD [facility]-INTERV RAD  . PORTACATH PLACEMENT  07/05/2012   Procedure: INSERTION PORT-A-CATH;  Surgeon: Donato Heinz, MD;  Location: [facility];  Service: General;  Laterality: Left;  subclavian  . THYROIDECTOMY, PARTIAL    . TONSILLECTOMY      Social History   Social History  . Marital status: Married    Spouse name: N/A  . Number of children: 3  . Years of education: N/A   Occupational History  . Not on file.   Social History Main Topics  . Smoking status: Never Smoker  . Smokeless tobacco: Never Used  . Alcohol use No  . Drug use: No  . Sexual activity: No   Other Topics Concern  . 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 Colon cancer Mother 85  . Stroke Mother 29  . Coronary artery disease Mother     No current facility-administered medications on file prior to encounter.  Current Outpatient Prescriptions on File Prior to Encounter  Medication Sig Dispense Refill  . albuterol (PROVENTIL) (2.5 MG/3ML) 0.083% nebulizer solution Take 2.5 mg by nebulization every 6 (six) hours as needed for wheezing or shortness of breath.    . ALPRAZolam (XANAX) 0.5 MG tablet Take 0.5 mg by mouth 3 (three) times daily as needed for anxiety or sleep.     Marland Kitchen amLODipine (NORVASC) 10 MG tablet Take 0.5 tablets (5 mg total) by mouth daily. 15 tablet 12  . metoprolol (LOPRESSOR) 50 MG tablet Take 0.5 tablets (25 mg total) by mouth 2 (two) times daily. 30 tablet 12  . multivitamin (RENA-VIT) TABS tablet Take 1 tablet by mouth daily.    . ondansetron (ZOFRAN-ODT) 4 MG disintegrating tablet Take 4 mg by mouth every 8 (eight) hours as needed for nausea or vomiting.    . Oxycodone HCl 10 MG TABS Take 1 tablet (10 mg total) by mouth 3 (three)  times daily as needed (pain). (Patient taking differently: Take 10 mg by mouth 4 (four) times daily as needed (pain). ) 15 tablet 0  . sevelamer carbonate (RENVELA) 800 MG tablet Take 1,600-3,200 mg by mouth See admin instructions. Take 3200 mg by mouth 3 times daily with meals and take 1600 mg by mouth with snacks.    Marland Kitchen acetaminophen (TYLENOL) 325 MG tablet Take 650 mg by mouth every 6 (six) hours as needed for moderate pain or fever.     Marland Kitchen DAPTOmycin 1,000 mg in sodium chloride 0.9 % 100 mL Inject 1,000 mg into the vein every Tuesday, Thursday, and Saturday at 6 PM. To be given after hemodialysis. Total duration: 14 days. Start date: 08/13/16. (Patient not taking: Reported on 11/25/2016) 1 Dose 0  . diphenhydrAMINE (BENADRYL) 25 mg capsule Take 25 mg by mouth every 6 (six) hours as needed for allergies.    . [DISCONTINUED] insulin lispro (HUMALOG) 100 UNIT/ML injection Inject 60 Units into the skin 2 (two) times daily.        Allergies  Allergen Reactions  . Contrast Media [Iodinated Diagnostic Agents] Anaphylaxis, Hives, Swelling and Other (See Comments)    Dye for cardiac cath. Tongue swells  . Pineapple Anaphylaxis, Itching, Swelling and Other (See Comments)    Pt able to eat now 11/25/16  . Pneumococcal Vaccines Swelling    Turns skin black, and bodily swelling  . Shellfish Allergy Itching, Swelling and Other (See Comments)    Facial swelling - Pt able to eat now 11/25/16  . Vancomycin Nausea And Vomiting and Other (See Comments)    Infusion "made me feel like I was dying" had to be readmitted to hospital    Review of Systems: As listed above, otherwise negative.  Physical Examination  Vitals:   11/25/16 0710  BP: (!) 163/82  Pulse: 86  Resp: 18  Temp: 99.1 F (37.3 C)  TempSrc: Oral  SpO2: 97%  Weight: 299 lb 13.2 oz (136 kg)  Height: _0  (1.651 m)    General: A&O x 3, WDWN  Pulmonary: Sym exp, good air movt, CTAB, no rales, rhonchi, & wheezing  Cardiac: RRR, Nl S1,  S2, no Murmurs, rubs or gallops  Gastrointestinal: soft, NTND, -G/R, - HSM, - masses, - CVAT B  Musculoskeletal: M/S 5/5 throughout , Extremities without ischemic changes , palpable thrill and bruit in L RC AVF, serpiginious fistula  Laboratory See Thayer is a 50 y.o. female who presents with: end stage renal disease-HD:  M/W/F , possible venous stenosis    The patient is scheduled for: L arm fistulogram, possible intervention I discussed with the patient the nature of angiographic procedures, especially the limited patencies of any endovascular intervention.  The patient is aware of that the risks of an angiographic procedure include but are not limited to: bleeding, infection, access site complications, renal failure, embolization, rupture of vessel, dissection, possible need for emergent surgical intervention, possible need for surgical procedures to treat the patient's pathology, and stroke and death.    In this patient, she has a history of anaphylaxis to contrast agents.  She has been premedicated but she is aware of the continued risk.  The patient is aware of the risks and agrees to proceed.  Adele Barthel, MD [facility] Office: [phone] Pager: [phone]  11/25/2016, 8:30 AM

## 2016-11-25 NOTE — Discharge Instructions (Signed)
Fistulogram, Care After Introduction Refer to this sheet in the next few weeks. These instructions provide you with information on caring for yourself after your procedure. Your health care provider may also give you more specific instructions. Your treatment has been planned according to current medical practices, but problems sometimes occur. Call your health care provider if you have any problems or questions after your procedure. What can I expect after the procedure? After your procedure, it is typical to have the following:  A small amount of discomfort in the area where the catheters were placed.  A small amount of bruising around the fistula.  Sleepiness and fatigue. Follow these instructions at home:  Rest at home for the day following your procedure.  Do not drive or operate heavy machinery while taking pain medicine.  Take medicines only as directed by your health care provider.  Do not take baths, swim, or use a hot tub until your health care provider approves. You may shower 24 hours after the procedure or as directed by your health care provider.  There are many different ways to close and cover an incision, including stitches, skin glue, and adhesive strips. Follow your health care provider's instructions on:  Incision care.  Bandage (dressing) changes and removal.  Incision closure removal.  Monitor your dialysis fistula carefully. Contact a health care provider if:  You have drainage, redness, swelling, or pain at your catheter site.  You have a fever.  You have chills. Get help right away if:  You feel weak.  You have trouble balancing.  You have trouble moving your arms or legs.  You have problems with your speech or vision.  You can no longer feel a vibration or buzz when you put your fingers over your dialysis fistula.  The limb that was used for the procedure:  Swells.  Is painful.  Is cold.  Is discolored, such as blue or pale  white. This information is not intended to replace advice given to you by your health care provider. Make sure you discuss any questions you have with your health care provider. Document Released: 04/22/2014 Document Revised: 05/13/2016 Document Reviewed: 01/25/2014  2017 Elsevier

## 2016-11-26 DIAGNOSIS — N2581 Secondary hyperparathyroidism of renal origin: Secondary | ICD-10-CM | POA: Diagnosis not present

## 2016-11-26 DIAGNOSIS — D509 Iron deficiency anemia, unspecified: Secondary | ICD-10-CM | POA: Diagnosis not present

## 2016-11-26 DIAGNOSIS — N186 End stage renal disease: Secondary | ICD-10-CM | POA: Diagnosis not present

## 2016-11-26 DIAGNOSIS — R110 Nausea: Secondary | ICD-10-CM | POA: Diagnosis not present

## 2016-11-26 DIAGNOSIS — D631 Anemia in chronic kidney disease: Secondary | ICD-10-CM | POA: Diagnosis not present

## 2016-11-26 DIAGNOSIS — Z992 Dependence on renal dialysis: Secondary | ICD-10-CM | POA: Diagnosis not present

## 2016-11-29 DIAGNOSIS — N186 End stage renal disease: Secondary | ICD-10-CM | POA: Diagnosis not present

## 2016-11-29 DIAGNOSIS — D509 Iron deficiency anemia, unspecified: Secondary | ICD-10-CM | POA: Diagnosis not present

## 2016-11-29 DIAGNOSIS — D631 Anemia in chronic kidney disease: Secondary | ICD-10-CM | POA: Diagnosis not present

## 2016-11-29 DIAGNOSIS — R110 Nausea: Secondary | ICD-10-CM | POA: Diagnosis not present

## 2016-11-29 DIAGNOSIS — Z992 Dependence on renal dialysis: Secondary | ICD-10-CM | POA: Diagnosis not present

## 2016-11-29 DIAGNOSIS — N2581 Secondary hyperparathyroidism of renal origin: Secondary | ICD-10-CM | POA: Diagnosis not present

## 2016-12-01 DIAGNOSIS — R110 Nausea: Secondary | ICD-10-CM | POA: Diagnosis not present

## 2016-12-01 DIAGNOSIS — D509 Iron deficiency anemia, unspecified: Secondary | ICD-10-CM | POA: Diagnosis not present

## 2016-12-01 DIAGNOSIS — D631 Anemia in chronic kidney disease: Secondary | ICD-10-CM | POA: Diagnosis not present

## 2016-12-01 DIAGNOSIS — Z992 Dependence on renal dialysis: Secondary | ICD-10-CM | POA: Diagnosis not present

## 2016-12-01 DIAGNOSIS — N186 End stage renal disease: Secondary | ICD-10-CM | POA: Diagnosis not present

## 2016-12-01 DIAGNOSIS — N2581 Secondary hyperparathyroidism of renal origin: Secondary | ICD-10-CM | POA: Diagnosis not present

## 2016-12-03 DIAGNOSIS — D509 Iron deficiency anemia, unspecified: Secondary | ICD-10-CM | POA: Diagnosis not present

## 2016-12-03 DIAGNOSIS — Z992 Dependence on renal dialysis: Secondary | ICD-10-CM | POA: Diagnosis not present

## 2016-12-03 DIAGNOSIS — R110 Nausea: Secondary | ICD-10-CM | POA: Diagnosis not present

## 2016-12-03 DIAGNOSIS — D631 Anemia in chronic kidney disease: Secondary | ICD-10-CM | POA: Diagnosis not present

## 2016-12-03 DIAGNOSIS — N186 End stage renal disease: Secondary | ICD-10-CM | POA: Diagnosis not present

## 2016-12-03 DIAGNOSIS — N2581 Secondary hyperparathyroidism of renal origin: Secondary | ICD-10-CM | POA: Diagnosis not present

## 2016-12-06 DIAGNOSIS — D631 Anemia in chronic kidney disease: Secondary | ICD-10-CM | POA: Diagnosis not present

## 2016-12-06 DIAGNOSIS — D509 Iron deficiency anemia, unspecified: Secondary | ICD-10-CM | POA: Diagnosis not present

## 2016-12-06 DIAGNOSIS — N2581 Secondary hyperparathyroidism of renal origin: Secondary | ICD-10-CM | POA: Diagnosis not present

## 2016-12-06 DIAGNOSIS — N186 End stage renal disease: Secondary | ICD-10-CM | POA: Diagnosis not present

## 2016-12-06 DIAGNOSIS — Z992 Dependence on renal dialysis: Secondary | ICD-10-CM | POA: Diagnosis not present

## 2016-12-06 DIAGNOSIS — R110 Nausea: Secondary | ICD-10-CM | POA: Diagnosis not present

## 2016-12-08 DIAGNOSIS — N186 End stage renal disease: Secondary | ICD-10-CM | POA: Diagnosis not present

## 2016-12-08 DIAGNOSIS — D631 Anemia in chronic kidney disease: Secondary | ICD-10-CM | POA: Diagnosis not present

## 2016-12-08 DIAGNOSIS — R110 Nausea: Secondary | ICD-10-CM | POA: Diagnosis not present

## 2016-12-08 DIAGNOSIS — N2581 Secondary hyperparathyroidism of renal origin: Secondary | ICD-10-CM | POA: Diagnosis not present

## 2016-12-08 DIAGNOSIS — Z992 Dependence on renal dialysis: Secondary | ICD-10-CM | POA: Diagnosis not present

## 2016-12-08 DIAGNOSIS — D509 Iron deficiency anemia, unspecified: Secondary | ICD-10-CM | POA: Diagnosis not present

## 2016-12-10 DIAGNOSIS — N2581 Secondary hyperparathyroidism of renal origin: Secondary | ICD-10-CM | POA: Diagnosis not present

## 2016-12-10 DIAGNOSIS — D631 Anemia in chronic kidney disease: Secondary | ICD-10-CM | POA: Diagnosis not present

## 2016-12-10 DIAGNOSIS — Z992 Dependence on renal dialysis: Secondary | ICD-10-CM | POA: Diagnosis not present

## 2016-12-10 DIAGNOSIS — R110 Nausea: Secondary | ICD-10-CM | POA: Diagnosis not present

## 2016-12-10 DIAGNOSIS — D509 Iron deficiency anemia, unspecified: Secondary | ICD-10-CM | POA: Diagnosis not present

## 2016-12-10 DIAGNOSIS — N186 End stage renal disease: Secondary | ICD-10-CM | POA: Diagnosis not present

## 2016-12-12 DIAGNOSIS — D631 Anemia in chronic kidney disease: Secondary | ICD-10-CM | POA: Diagnosis not present

## 2016-12-12 DIAGNOSIS — D509 Iron deficiency anemia, unspecified: Secondary | ICD-10-CM | POA: Diagnosis not present

## 2016-12-12 DIAGNOSIS — N2581 Secondary hyperparathyroidism of renal origin: Secondary | ICD-10-CM | POA: Diagnosis not present

## 2016-12-12 DIAGNOSIS — N186 End stage renal disease: Secondary | ICD-10-CM | POA: Diagnosis not present

## 2016-12-12 DIAGNOSIS — R110 Nausea: Secondary | ICD-10-CM | POA: Diagnosis not present

## 2016-12-12 DIAGNOSIS — Z992 Dependence on renal dialysis: Secondary | ICD-10-CM | POA: Diagnosis not present

## 2016-12-15 DIAGNOSIS — N2581 Secondary hyperparathyroidism of renal origin: Secondary | ICD-10-CM | POA: Diagnosis not present

## 2016-12-15 DIAGNOSIS — R110 Nausea: Secondary | ICD-10-CM | POA: Diagnosis not present

## 2016-12-15 DIAGNOSIS — N186 End stage renal disease: Secondary | ICD-10-CM | POA: Diagnosis not present

## 2016-12-15 DIAGNOSIS — D509 Iron deficiency anemia, unspecified: Secondary | ICD-10-CM | POA: Diagnosis not present

## 2016-12-15 DIAGNOSIS — Z992 Dependence on renal dialysis: Secondary | ICD-10-CM | POA: Diagnosis not present

## 2016-12-15 DIAGNOSIS — D631 Anemia in chronic kidney disease: Secondary | ICD-10-CM | POA: Diagnosis not present

## 2016-12-17 DIAGNOSIS — D631 Anemia in chronic kidney disease: Secondary | ICD-10-CM | POA: Diagnosis not present

## 2016-12-17 DIAGNOSIS — R110 Nausea: Secondary | ICD-10-CM | POA: Diagnosis not present

## 2016-12-17 DIAGNOSIS — Z992 Dependence on renal dialysis: Secondary | ICD-10-CM | POA: Diagnosis not present

## 2016-12-17 DIAGNOSIS — D509 Iron deficiency anemia, unspecified: Secondary | ICD-10-CM | POA: Diagnosis not present

## 2016-12-17 DIAGNOSIS — N2581 Secondary hyperparathyroidism of renal origin: Secondary | ICD-10-CM | POA: Diagnosis not present

## 2016-12-17 DIAGNOSIS — N186 End stage renal disease: Secondary | ICD-10-CM | POA: Diagnosis not present

## 2016-12-19 DIAGNOSIS — Z992 Dependence on renal dialysis: Secondary | ICD-10-CM | POA: Diagnosis not present

## 2016-12-19 DIAGNOSIS — N186 End stage renal disease: Secondary | ICD-10-CM | POA: Diagnosis not present

## 2016-12-20 DIAGNOSIS — R110 Nausea: Secondary | ICD-10-CM | POA: Diagnosis not present

## 2016-12-20 DIAGNOSIS — D631 Anemia in chronic kidney disease: Secondary | ICD-10-CM | POA: Diagnosis not present

## 2016-12-20 DIAGNOSIS — N2581 Secondary hyperparathyroidism of renal origin: Secondary | ICD-10-CM | POA: Diagnosis not present

## 2016-12-20 DIAGNOSIS — N186 End stage renal disease: Secondary | ICD-10-CM | POA: Diagnosis not present

## 2016-12-20 DIAGNOSIS — Z992 Dependence on renal dialysis: Secondary | ICD-10-CM | POA: Diagnosis not present

## 2016-12-20 DIAGNOSIS — D509 Iron deficiency anemia, unspecified: Secondary | ICD-10-CM | POA: Diagnosis not present

## 2016-12-20 DIAGNOSIS — R1110 Vomiting, unspecified: Secondary | ICD-10-CM | POA: Diagnosis not present

## 2016-12-22 DIAGNOSIS — Z992 Dependence on renal dialysis: Secondary | ICD-10-CM | POA: Diagnosis not present

## 2016-12-22 DIAGNOSIS — R110 Nausea: Secondary | ICD-10-CM | POA: Diagnosis not present

## 2016-12-22 DIAGNOSIS — N186 End stage renal disease: Secondary | ICD-10-CM | POA: Diagnosis not present

## 2016-12-22 DIAGNOSIS — D631 Anemia in chronic kidney disease: Secondary | ICD-10-CM | POA: Diagnosis not present

## 2016-12-22 DIAGNOSIS — D509 Iron deficiency anemia, unspecified: Secondary | ICD-10-CM | POA: Diagnosis not present

## 2016-12-22 DIAGNOSIS — N2581 Secondary hyperparathyroidism of renal origin: Secondary | ICD-10-CM | POA: Diagnosis not present

## 2016-12-27 DIAGNOSIS — D631 Anemia in chronic kidney disease: Secondary | ICD-10-CM | POA: Diagnosis not present

## 2016-12-27 DIAGNOSIS — N186 End stage renal disease: Secondary | ICD-10-CM | POA: Diagnosis not present

## 2016-12-27 DIAGNOSIS — Z992 Dependence on renal dialysis: Secondary | ICD-10-CM | POA: Diagnosis not present

## 2016-12-27 DIAGNOSIS — R110 Nausea: Secondary | ICD-10-CM | POA: Diagnosis not present

## 2016-12-27 DIAGNOSIS — N2581 Secondary hyperparathyroidism of renal origin: Secondary | ICD-10-CM | POA: Diagnosis not present

## 2016-12-27 DIAGNOSIS — D509 Iron deficiency anemia, unspecified: Secondary | ICD-10-CM | POA: Diagnosis not present

## 2016-12-29 DIAGNOSIS — N186 End stage renal disease: Secondary | ICD-10-CM | POA: Diagnosis not present

## 2016-12-29 DIAGNOSIS — Z992 Dependence on renal dialysis: Secondary | ICD-10-CM | POA: Diagnosis not present

## 2016-12-29 DIAGNOSIS — N2581 Secondary hyperparathyroidism of renal origin: Secondary | ICD-10-CM | POA: Diagnosis not present

## 2016-12-29 DIAGNOSIS — D509 Iron deficiency anemia, unspecified: Secondary | ICD-10-CM | POA: Diagnosis not present

## 2016-12-29 DIAGNOSIS — D631 Anemia in chronic kidney disease: Secondary | ICD-10-CM | POA: Diagnosis not present

## 2016-12-29 DIAGNOSIS — R110 Nausea: Secondary | ICD-10-CM | POA: Diagnosis not present

## 2016-12-31 ENCOUNTER — Inpatient Hospital Stay (HOSPITAL_COMMUNITY)
Admission: EM | Admit: 2016-12-31 | Discharge: 2017-01-06 | DRG: 871 | Disposition: A | Discharge status: Home / Self Care | Payer: Medicare Other | Attending: Pulmonary Disease | Admitting: Pulmonary Disease

## 2016-12-31 ENCOUNTER — Emergency Department (HOSPITAL_COMMUNITY): Payer: Medicare Other

## 2016-12-31 ENCOUNTER — Encounter (HOSPITAL_COMMUNITY): Payer: Self-pay | Admitting: *Deleted

## 2016-12-31 DIAGNOSIS — J189 Pneumonia, unspecified organism: Secondary | ICD-10-CM | POA: Diagnosis not present

## 2016-12-31 DIAGNOSIS — E7800 Pure hypercholesterolemia, unspecified: Secondary | ICD-10-CM | POA: Diagnosis present

## 2016-12-31 DIAGNOSIS — I890 Lymphedema, not elsewhere classified: Secondary | ICD-10-CM | POA: Diagnosis present

## 2016-12-31 DIAGNOSIS — N186 End stage renal disease: Secondary | ICD-10-CM | POA: Diagnosis present

## 2016-12-31 DIAGNOSIS — Z91018 Allergy to other foods: Secondary | ICD-10-CM

## 2016-12-31 DIAGNOSIS — R509 Fever, unspecified: Secondary | ICD-10-CM | POA: Diagnosis not present

## 2016-12-31 DIAGNOSIS — J9611 Chronic respiratory failure with hypoxia: Secondary | ICD-10-CM | POA: Diagnosis present

## 2016-12-31 DIAGNOSIS — E1122 Type 2 diabetes mellitus with diabetic chronic kidney disease: Secondary | ICD-10-CM | POA: Diagnosis present

## 2016-12-31 DIAGNOSIS — I132 Hypertensive heart and chronic kidney disease with heart failure and with stage 5 chronic kidney disease, or end stage renal disease: Secondary | ICD-10-CM | POA: Diagnosis present

## 2016-12-31 DIAGNOSIS — I2510 Atherosclerotic heart disease of native coronary artery without angina pectoris: Secondary | ICD-10-CM | POA: Diagnosis present

## 2016-12-31 DIAGNOSIS — A419 Sepsis, unspecified organism: Secondary | ICD-10-CM | POA: Diagnosis not present

## 2016-12-31 DIAGNOSIS — R0602 Shortness of breath: Secondary | ICD-10-CM | POA: Diagnosis not present

## 2016-12-31 DIAGNOSIS — Z8711 Personal history of peptic ulcer disease: Secondary | ICD-10-CM

## 2016-12-31 DIAGNOSIS — E785 Hyperlipidemia, unspecified: Secondary | ICD-10-CM | POA: Diagnosis present

## 2016-12-31 DIAGNOSIS — Z9981 Dependence on supplemental oxygen: Secondary | ICD-10-CM | POA: Diagnosis not present

## 2016-12-31 DIAGNOSIS — J9801 Acute bronchospasm: Secondary | ICD-10-CM

## 2016-12-31 DIAGNOSIS — R061 Stridor: Secondary | ICD-10-CM | POA: Diagnosis not present

## 2016-12-31 DIAGNOSIS — E8889 Other specified metabolic disorders: Secondary | ICD-10-CM | POA: Diagnosis present

## 2016-12-31 DIAGNOSIS — I878 Other specified disorders of veins: Secondary | ICD-10-CM | POA: Diagnosis present

## 2016-12-31 DIAGNOSIS — E662 Morbid (severe) obesity with alveolar hypoventilation: Secondary | ICD-10-CM | POA: Diagnosis present

## 2016-12-31 DIAGNOSIS — E1129 Type 2 diabetes mellitus with other diabetic kidney complication: Secondary | ICD-10-CM | POA: Diagnosis not present

## 2016-12-31 DIAGNOSIS — R0902 Hypoxemia: Secondary | ICD-10-CM

## 2016-12-31 DIAGNOSIS — Z6841 Body Mass Index (BMI) 40.0 and over, adult: Secondary | ICD-10-CM

## 2016-12-31 DIAGNOSIS — N2581 Secondary hyperparathyroidism of renal origin: Secondary | ICD-10-CM | POA: Diagnosis present

## 2016-12-31 DIAGNOSIS — K5900 Constipation, unspecified: Secondary | ICD-10-CM | POA: Diagnosis not present

## 2016-12-31 DIAGNOSIS — D631 Anemia in chronic kidney disease: Secondary | ICD-10-CM | POA: Diagnosis not present

## 2016-12-31 DIAGNOSIS — E871 Hypo-osmolality and hyponatremia: Secondary | ICD-10-CM | POA: Diagnosis present

## 2016-12-31 DIAGNOSIS — J45909 Unspecified asthma, uncomplicated: Secondary | ICD-10-CM | POA: Diagnosis present

## 2016-12-31 DIAGNOSIS — Z992 Dependence on renal dialysis: Secondary | ICD-10-CM | POA: Diagnosis not present

## 2016-12-31 DIAGNOSIS — I10 Essential (primary) hypertension: Secondary | ICD-10-CM | POA: Diagnosis present

## 2016-12-31 DIAGNOSIS — Z8249 Family history of ischemic heart disease and other diseases of the circulatory system: Secondary | ICD-10-CM

## 2016-12-31 DIAGNOSIS — Y95 Nosocomial condition: Secondary | ICD-10-CM | POA: Diagnosis present

## 2016-12-31 DIAGNOSIS — I872 Venous insufficiency (chronic) (peripheral): Secondary | ICD-10-CM | POA: Diagnosis present

## 2016-12-31 DIAGNOSIS — Z887 Allergy status to serum and vaccine status: Secondary | ICD-10-CM

## 2016-12-31 DIAGNOSIS — J9621 Acute and chronic respiratory failure with hypoxia: Secondary | ICD-10-CM | POA: Diagnosis present

## 2016-12-31 DIAGNOSIS — E119 Type 2 diabetes mellitus without complications: Secondary | ICD-10-CM

## 2016-12-31 DIAGNOSIS — M898X9 Other specified disorders of bone, unspecified site: Secondary | ICD-10-CM | POA: Diagnosis present

## 2016-12-31 DIAGNOSIS — D509 Iron deficiency anemia, unspecified: Secondary | ICD-10-CM | POA: Diagnosis not present

## 2016-12-31 DIAGNOSIS — F419 Anxiety disorder, unspecified: Secondary | ICD-10-CM | POA: Diagnosis present

## 2016-12-31 DIAGNOSIS — Z79899 Other long term (current) drug therapy: Secondary | ICD-10-CM

## 2016-12-31 DIAGNOSIS — D638 Anemia in other chronic diseases classified elsewhere: Secondary | ICD-10-CM | POA: Diagnosis present

## 2016-12-31 DIAGNOSIS — E1165 Type 2 diabetes mellitus with hyperglycemia: Secondary | ICD-10-CM | POA: Diagnosis present

## 2016-12-31 DIAGNOSIS — R05 Cough: Secondary | ICD-10-CM | POA: Diagnosis not present

## 2016-12-31 DIAGNOSIS — Z91013 Allergy to seafood: Secondary | ICD-10-CM

## 2016-12-31 DIAGNOSIS — I5032 Chronic diastolic (congestive) heart failure: Secondary | ICD-10-CM | POA: Diagnosis present

## 2016-12-31 DIAGNOSIS — G8929 Other chronic pain: Secondary | ICD-10-CM | POA: Diagnosis present

## 2016-12-31 DIAGNOSIS — Z881 Allergy status to other antibiotic agents status: Secondary | ICD-10-CM

## 2016-12-31 DIAGNOSIS — Z90710 Acquired absence of both cervix and uterus: Secondary | ICD-10-CM

## 2016-12-31 DIAGNOSIS — Z91041 Radiographic dye allergy status: Secondary | ICD-10-CM

## 2016-12-31 DIAGNOSIS — Z823 Family history of stroke: Secondary | ICD-10-CM

## 2016-12-31 DIAGNOSIS — Z8719 Personal history of other diseases of the digestive system: Secondary | ICD-10-CM

## 2016-12-31 DIAGNOSIS — M797 Fibromyalgia: Secondary | ICD-10-CM | POA: Diagnosis present

## 2016-12-31 DIAGNOSIS — R110 Nausea: Secondary | ICD-10-CM | POA: Diagnosis not present

## 2016-12-31 LAB — CBC WITH DIFFERENTIAL/PLATELET
BASOS ABS: 0 10*3/uL (ref 0.0–0.1)
Basophils Relative: 0 %
EOS ABS: 0.2 10*3/uL (ref 0.0–0.7)
EOS PCT: 2 %
HCT: 33.7 % — ABNORMAL LOW (ref 36.0–46.0)
HEMOGLOBIN: 11.2 g/dL — AB (ref 12.0–15.0)
LYMPHS ABS: 1.5 10*3/uL (ref 0.7–4.0)
LYMPHS PCT: 15 %
MCH: 31.8 pg (ref 26.0–34.0)
MCHC: 33.2 g/dL (ref 30.0–36.0)
MCV: 95.7 fL (ref 78.0–100.0)
Monocytes Absolute: 0.4 10*3/uL (ref 0.1–1.0)
Monocytes Relative: 4 %
NEUTROS PCT: 79 %
Neutro Abs: 7.8 10*3/uL — ABNORMAL HIGH (ref 1.7–7.7)
PLATELETS: 220 10*3/uL (ref 150–400)
RBC: 3.52 MIL/uL — AB (ref 3.87–5.11)
RDW: 13.8 % (ref 11.5–15.5)
WBC: 9.9 10*3/uL (ref 4.0–10.5)

## 2016-12-31 LAB — I-STAT CG4 LACTIC ACID, ED: LACTIC ACID, VENOUS: 0.88 mmol/L (ref 0.5–1.9)

## 2016-12-31 LAB — COMPREHENSIVE METABOLIC PANEL
ALT: 17 U/L (ref 14–54)
AST: 20 U/L (ref 15–41)
Albumin: 3.5 g/dL (ref 3.5–5.0)
Alkaline Phosphatase: 104 U/L (ref 38–126)
Anion gap: 10 (ref 5–15)
BILIRUBIN TOTAL: 0.5 mg/dL (ref 0.3–1.2)
BUN: 20 mg/dL (ref 6–20)
CHLORIDE: 94 mmol/L — AB (ref 101–111)
CO2: 29 mmol/L (ref 22–32)
Calcium: 8.5 mg/dL — ABNORMAL LOW (ref 8.9–10.3)
Creatinine, Ser: 3.46 mg/dL — ABNORMAL HIGH (ref 0.44–1.00)
GFR, EST AFRICAN AMERICAN: 17 mL/min — AB (ref >60)
GFR, EST NON AFRICAN AMERICAN: 14 mL/min — AB (ref >60)
Glucose, Bld: 126 mg/dL — ABNORMAL HIGH (ref 65–99)
Potassium: 3.4 mmol/L — ABNORMAL LOW (ref 3.5–5.1)
Sodium: 133 mmol/L — ABNORMAL LOW (ref 135–145)
TOTAL PROTEIN: 7.6 g/dL (ref 6.5–8.1)

## 2016-12-31 LAB — INFLUENZA PANEL BY PCR (TYPE A & B)
Influenza A By PCR: NEGATIVE
Influenza B By PCR: NEGATIVE

## 2016-12-31 LAB — RAPID STREP SCREEN (MED CTR MEBANE ONLY): STREPTOCOCCUS, GROUP A SCREEN (DIRECT): NEGATIVE

## 2016-12-31 MED ORDER — RENA-VITE PO TABS
1.0000 | ORAL_TABLET | Freq: Every day | ORAL | Status: DC
  Administered 2017-01-01 – 2017-01-06 (×6): 1 via ORAL
  Filled 2016-12-31 (×6): qty 1

## 2016-12-31 MED ORDER — DOXYCYCLINE HYCLATE 100 MG IV SOLR
INTRAVENOUS | Status: AC
  Filled 2016-12-31: qty 100

## 2016-12-31 MED ORDER — OXYCODONE HCL 5 MG PO TABS
10.0000 mg | ORAL_TABLET | Freq: Four times a day (QID) | ORAL | Status: DC | PRN
  Administered 2016-12-31 – 2017-01-06 (×12): 10 mg via ORAL
  Filled 2016-12-31 (×13): qty 2

## 2016-12-31 MED ORDER — ALBUTEROL SULFATE (2.5 MG/3ML) 0.083% IN NEBU
2.5000 mg | INHALATION_SOLUTION | Freq: Once | RESPIRATORY_TRACT | Status: AC
  Administered 2016-12-31: 2.5 mg via RESPIRATORY_TRACT
  Filled 2016-12-31: qty 3

## 2016-12-31 MED ORDER — DOXYCYCLINE HYCLATE 100 MG IV SOLR
100.0000 mg | Freq: Two times a day (BID) | INTRAVENOUS | Status: DC
  Administered 2016-12-31 – 2017-01-06 (×12): 100 mg via INTRAVENOUS
  Filled 2016-12-31 (×14): qty 100

## 2016-12-31 MED ORDER — SEVELAMER CARBONATE 800 MG PO TABS
1600.0000 mg | ORAL_TABLET | ORAL | Status: DC
  Administered 2017-01-03: 1600 mg via ORAL
  Filled 2016-12-31 (×2): qty 2

## 2016-12-31 MED ORDER — SEVELAMER CARBONATE 800 MG PO TABS
3200.0000 mg | ORAL_TABLET | Freq: Three times a day (TID) | ORAL | Status: DC
  Administered 2017-01-01 – 2017-01-06 (×15): 3200 mg via ORAL
  Filled 2016-12-31 (×14): qty 4

## 2016-12-31 MED ORDER — PIPERACILLIN-TAZOBACTAM 3.375 G IVPB: 3.3750 g | Freq: Three times a day (TID) | INTRAVENOUS | Status: DC

## 2016-12-31 MED ORDER — ACETAMINOPHEN 650 MG RE SUPP: 650.0000 mg | Freq: Four times a day (QID) | RECTAL | Status: DC | PRN

## 2016-12-31 MED ORDER — ONDANSETRON 4 MG PO TBDP
4.0000 mg | ORAL_TABLET | Freq: Three times a day (TID) | ORAL | Status: DC | PRN
  Administered 2017-01-01 – 2017-01-06 (×6): 4 mg via ORAL
  Filled 2016-12-31 (×6): qty 1

## 2016-12-31 MED ORDER — IPRATROPIUM-ALBUTEROL 0.5-2.5 (3) MG/3ML IN SOLN
3.0000 mL | Freq: Once | RESPIRATORY_TRACT | Status: AC
  Administered 2016-12-31: 3 mL via RESPIRATORY_TRACT
  Filled 2016-12-31: qty 3

## 2016-12-31 MED ORDER — PIPERACILLIN-TAZOBACTAM 3.375 G IVPB 30 MIN
3.3750 g | Freq: Once | INTRAVENOUS | Status: AC
  Administered 2016-12-31: 3.375 g via INTRAVENOUS
  Filled 2016-12-31: qty 50

## 2016-12-31 MED ORDER — ALPRAZOLAM 0.5 MG PO TABS: 0.5000 mg | ORAL_TABLET | Freq: Three times a day (TID) | ORAL | Status: DC | PRN

## 2016-12-31 MED ORDER — ALBUTEROL SULFATE (2.5 MG/3ML) 0.083% IN NEBU
2.5000 mg | INHALATION_SOLUTION | Freq: Four times a day (QID) | RESPIRATORY_TRACT | Status: DC
  Filled 2016-12-31: qty 3

## 2016-12-31 MED ORDER — SEVELAMER CARBONATE 800 MG PO TABS: 1600.0000 mg | ORAL_TABLET | ORAL | Status: DC

## 2016-12-31 MED ORDER — ACETAMINOPHEN 325 MG PO TABS: 650.0000 mg | ORAL_TABLET | Freq: Four times a day (QID) | ORAL | Status: DC | PRN

## 2016-12-31 MED ORDER — HEPARIN SODIUM (PORCINE) 5000 UNIT/ML IJ SOLN
5000.0000 [IU] | Freq: Three times a day (TID) | INTRAMUSCULAR | Status: DC
  Administered 2016-12-31 – 2017-01-06 (×16): 5000 [IU] via SUBCUTANEOUS
  Filled 2016-12-31 (×16): qty 1

## 2016-12-31 MED ORDER — PIPERACILLIN-TAZOBACTAM 3.375 G IVPB
3.3750 g | Freq: Two times a day (BID) | INTRAVENOUS | Status: DC
  Administered 2017-01-01 – 2017-01-06 (×11): 3.375 g via INTRAVENOUS
  Filled 2016-12-31 (×11): qty 50

## 2016-12-31 MED ORDER — CINACALCET HCL 30 MG PO TABS
30.0000 mg | ORAL_TABLET | Freq: Every day | ORAL | Status: DC
  Administered 2017-01-01 – 2017-01-06 (×6): 30 mg via ORAL
  Filled 2016-12-31 (×7): qty 1

## 2016-12-31 MED ORDER — ALBUTEROL SULFATE (2.5 MG/3ML) 0.083% IN NEBU
2.5000 mg | INHALATION_SOLUTION | RESPIRATORY_TRACT | Status: DC | PRN
  Administered 2017-01-01: 2.5 mg via RESPIRATORY_TRACT

## 2016-12-31 MED ORDER — VANCOMYCIN HCL IN DEXTROSE 1-5 GM/200ML-% IV SOLN: 1000.0000 mg | Freq: Once | INTRAVENOUS | Status: DC

## 2016-12-31 MED ORDER — SODIUM CHLORIDE 0.9 % IV SOLN
1000.0000 mL | INTRAVENOUS | Status: DC
  Administered 2016-12-31 – 2017-01-01 (×2): 1000 mL via INTRAVENOUS

## 2016-12-31 NOTE — Progress Notes (Signed)
Pharmacy Antibiotic Note  [name] is a 51 y.o. female admitted on 12/31/2016 with sepsis.  Pharmacy has been consulted for Zosyn and doxycycline dosing.  Plan: Zosyn 3.375gm IV x 1 in ED then, 3.375gm IV q12h EID , infused over 4 hours Doxycycline 125m IV q12h F/U cxs and clinical progress [place] tx as indicated  Height: _0  (165.1 cm) Weight: 295 lb 6.7 oz (134 kg) IBW/kg (Calculated) : 57  Temp (24hrs), Avg:101.1 F (38.4 C), Min:100.6 F (38.1 C), Max:102 F (38.9 C)   Recent Labs Lab 12/31/16 1908 12/31/16 2012  WBC 9.9  --   CREATININE 3.46*  --   LATICACIDVEN  --  0.88    Estimated Creatinine Clearance: 27 mL/min (by C-G formula based on SCr of 3.46 mg/dL (H)).    Allergies  Allergen Reactions  . Contrast Media [Iodinated Diagnostic Agents] Anaphylaxis, Hives, Swelling and Other (See Comments)    Dye for cardiac cath. Tongue swells  . Pneumococcal Vaccines Swelling    Turns skin black, and bodily swelling  . Shellfish Allergy Itching, Swelling and Other (See Comments)    Facial swelling - Pt able to eat now 11/25/16  . Vancomycin Nausea And Vomiting and Other (See Comments)    Infusion "made me feel like I was dying" had to be readmitted to hospital    Antimicrobials this admission: Zosyn 1/12 >> Doxycycline 1/12 >>   Microbiology results: 1/12 BCx: pending 1/12 Influenzae panel is negative 1/12 Grp A Strep: pending   Thank you for allowing pharmacy to be a part of this patient's care.  LIsac Sarna BS PVena Austria BCaliforniaClinical Pharmacist Pager #[phone] 68541/11/2017 9:59 PM

## 2016-12-31 NOTE — ED Triage Notes (Signed)
Temp for 2 days.  Non productive cough.  Dialysis pt.  Received full treatment today.  Room air sat 81% per fire department.  Pt sats now 95 on 6 liters [place].

## 2016-12-31 NOTE — ED Provider Notes (Signed)
[facility] DEPT Provider Note   CSN: [id number] Arrival date & time: 12/31/16  [place]     History   Chief Complaint Chief Complaint  Patient presents with  . Shortness of Breath    HPI [name] is a 51 y.o. female.   Shortness of Breath     Pt was seen at 1855.  Per pt, c/o gradual onset and persistence of constant sore throat, runny/stuffy nose, sinus congestion, SOB and cough for the past 2 days. Has been associated with "wheezing" and fevers. LD usual HD was today (rarely makes urine at baseline). EMS states pt's O2 Sat was "81% R/A" on their arrival to scene; improved to 95% on O2 6L N/C. APAP given by EMS. Endorses others in household with similar symptoms. Denies rash, no CP/palpitations, no N/V/D, no abd pain.    Past Medical History:  Diagnosis Date  . Anxiety   . Asthma    as a child  . Cancer ([place])    thyroid  . Cellulitis   . CHF (congestive heart failure) ([place])   . Chiari malformation    s/p surgery  . Chiari malformation   . Chronic pain   . Complication of anesthesia 11/28/15   Resp arrest after  conscious  sedation  . Constipation   . Coronary artery disease    40-50% mid LAD 04/29/09, Medical tx. (Dr. Gwenlyn Found)  . Diabetes mellitus    Type II  . Fibromyalgia   . History of blood transfusion    hemorrage duinrg pregancy  . Hx of echocardiogram 10/2011   EF 55-60%  . Hypercholesterolemia   . Hypertension   . Lymph edema   . Obesity hypoventilation syndrome ([place])   . On home O2   . Pneumonia    in past  . Renal disorder    Pt started dialysis in Dec.2016  . S/P colonoscopy 05/26/2007   Dr. Laural Golden sigmoid diverticulosis random biopsies benign  . S/P endoscopy 05/01/2009   Dr. Penelope Coop pill-induced esophageal ulcerations distal to midesophagus, 2 small ulcers in the antrum of the stomach  . Shortness of breath dyspnea    with any exertion or if heart rate  is irregular while on dialysis  . Sleep apnea     Patient Active Problem List   Diagnosis Date Noted  . Numbness and tingling in right hand 08/13/2016  . Sepsis ([place]) 08/11/2016  . Coagulase negative Staphylococcus bacteremia 06/11/2016  . UTI (lower urinary tract infection) 06/06/2016  . ESRD (end stage renal disease) ([place]) 06/06/2016  . Chronic cholecystitis 12/09/2015  . Protein-calorie malnutrition, severe 11/29/2015  . Respiratory arrest ([place]) 11/28/2015  . Recurrent cellulitis of lower leg 11/13/2015  . Chronic diastolic CHF (congestive heart failure) ([place]) 11/13/2015  . Chronic respiratory failure with hypoxia ([place]) 11/13/2015  . ESRD on dialysis ([place]) 11/13/2015  . Anemia of chronic disease 11/13/2015  . Rotator cuff syndrome of right shoulder 05/30/2013  . Pain in joint, shoulder region 05/30/2013  . Obesity hypoventilation syndrome ([place]) 09/04/2012  . Leukopenia 11/11/2011  . HTN (hypertension) 11/11/2011  . HLD (hyperlipidemia) 11/11/2011  . Transaminitis 11/11/2011  . Diabetes mellitus ([place]) 10/27/2011  . Morbid obesity ([place]) 10/27/2011  . Lymphedema of lower extremity 10/04/2011    Past Surgical History:  Procedure Laterality Date  . Marland KitchenHemodialysis catheter Right 11/28/2015  . ABDOMINAL HYSTERECTOMY    . ADENOIDECTOMY    . AV FISTULA PLACEMENT Left 03/15/2016   Procedure: CREATION OF LEFT ARM ARTERIOVENOUS (AV) FISTULA  ;  Surgeon: Rosetta Posner, MD;  Location: [facility];  Service: Vascular;  Laterality: Left;  . CESAREAN SECTION      x 2  . CRANIECTOMY SUBOCCIPITAL W/ CERVICAL LAMINECTOMY / CHIARI    . FISTULA SUPERFICIALIZATION Left 05/10/2016   Procedure: Left Arm FISTULA SUPERFICIALIZATION;  Surgeon: Rosetta Posner, MD;  Location: [facility] OR;  Service: Vascular;  Laterality: Left;  . IR GENERIC HISTORICAL  08/14/2016   IR REMOVAL TUN ACCESS W/ PORT W/O FL MOD SED 08/14/2016 Arne Cleveland, MD [facility]-INTER[facility] RAD  . PERIPHERAL VASCULAR CATHETERIZATION Left 11/25/2016   Procedure: A/V Fistulagram;  Surgeon: Conrad Deadwood, MD;  Location: [facility];   Service: Cardiovascular;  Laterality: Left;  arm  . PORTACATH PLACEMENT  07/05/2012   Procedure: INSERTION PORT-A-CATH;  Surgeon: Donato Heinz, MD;  Location: [facility];  Service: General;  Laterality: Left;  subclavian  . THYROIDECTOMY, PARTIAL    . TONSILLECTOMY      OB History    Gravida Para Term Preterm AB Living   _0 SAB TAB Ectopic Multiple Live Births   1               Home Medications    Prior to Admission medications   Medication Sig Start Date End Date Taking? Authorizing Provider  acetaminophen (TYLENOL) 325 MG tablet Take 650 mg by mouth every 6 (six) hours as needed for moderate pain or fever.     Historical Provider, MD  albuterol (PROVENTIL) (2.5 MG/3ML) 0.083% nebulizer solution Take 2.5 mg by nebulization every 6 (six) hours as needed for wheezing or shortness of breath.    Historical Provider, MD  ALPRAZolam Duanne Moron) 0.5 MG tablet Take 0.5 mg by mouth 3 (three) times daily as needed for anxiety or sleep.     Historical Provider, MD  amLODipine (NORVASC) 10 MG tablet Take 0.5 tablets (5 mg total) by mouth daily. 06/11/16   [name], MD  cinacalcet (SENSIPAR) 30 MG tablet Take 30 mg by mouth daily.    Historical Provider, MD  DAPTOmycin 1,000 mg in sodium chloride 0.9 % 100 mL Inject 1,000 mg into the vein every Tuesday, Thursday, and Saturday at 6 PM. To be given after hemodialysis. Total duration: 14 days. Start date: 08/13/16. Patient not taking: Reported on 11/25/2016 08/17/16   Modena Jansky, MD  diphenhydrAMINE (BENADRYL) 25 mg capsule Take 25 mg by mouth every 6 (six) hours as needed for allergies.    Historical Provider, MD  metoprolol (LOPRESSOR) 50 MG tablet Take 0.5 tablets (25 mg total) by mouth 2 (two) times daily. 12/04/15   [name], MD  multivitamin (RENA-VIT) TABS tablet Take 1 tablet by mouth daily.    Historical Provider, MD  ondansetron (ZOFRAN-ODT) 4 MG disintegrating tablet Take 4 mg by mouth every 8 (eight) hours as needed for  nausea or vomiting.    Historical Provider, MD  Oxycodone HCl 10 MG TABS Take 1 tablet (10 mg total) by mouth 3 (three) times daily as needed (pain). Patient taking differently: Take 10 mg by mouth 4 (four) times daily as needed (pain).  05/10/16   Alvia Grove, PA-C  sevelamer carbonate (RENVELA) 800 MG tablet Take 1,600-3,200 mg by mouth See admin instructions. Take 3200 mg by mouth 3 times daily with meals and take 1600 mg by mouth with snacks.    Historical Provider, MD    Family History Family History  Problem Relation Age of Onset  .  Colon cancer Mother 44  . Stroke Mother 58  . Coronary artery disease Mother     Social History Social History  Substance Use Topics  . Smoking status: Never Smoker  . Smokeless tobacco: Never Used  . Alcohol use No     Allergies   Contrast media [iodinated diagnostic agents]; Pineapple; Pneumococcal vaccines; Shellfish allergy; and Vancomycin   Review of Systems Review of Systems  Respiratory: Positive for shortness of breath.   ROS: Statement: All systems negative except as marked or noted in the HPI; Constitutional: +fever and chills. ; ; Eyes: Negative for eye pain, redness and discharge. ; ; ENMT: Negative for ear pain, hoarseness, +nasal congestion, rhinorrhea, sinus pressure and sore throat. ; ; Cardiovascular: Negative for chest pain, palpitations, diaphoresis, and peripheral edema. ; ; Respiratory: +cough, wheezing, SOB. Negative for stridor. ; ; Gastrointestinal: Negative for nausea, vomiting, diarrhea, abdominal pain, blood in stool, hematemesis, jaundice and rectal bleeding. . ; ; Genitourinary: Negative for dysuria, flank pain and hematuria. ; ; Musculoskeletal: Negative for back pain and neck pain. Negative for swelling and trauma.; ; Skin: Negative for pruritus, rash, abrasions, blisters, bruising and skin lesion.; ; Neuro: Negative for headache, lightheadedness and neck stiffness. Negative for weakness, altered level of  consciousness, altered mental status, extremity weakness, paresthesias, involuntary movement, seizure and syncope.      Physical Exam Updated Vital Signs BP 111/57   Pulse 107   Temp 102 F (38.9 C) (Oral)   Resp 24   Ht _0  (1.651 m)   Wt 295 lb 6.7 oz (134 kg)   SpO2 98%   BMI 49.16 kg/m    Patient Vitals for the past 24 hrs:  BP Temp Temp src Pulse Resp SpO2 Height Weight  12/31/16 1857 111/57 102 F (38.9 C) Oral 107 24 98 % - -  12/31/16 1852 121/62 100.8 F (38.2 C) Oral 109 22 99 % _1  (1.651 m) 295 lb 6.7 oz (134 kg)     Physical Exam 1900: Physical examination:  Nursing notes reviewed; Vital signs and O2 SAT reviewed; +febrile.;; Constitutional: Well developed, Well nourished, Well hydrated, In no acute distress; Head:  Normocephalic, atraumatic; Eyes: EOMI, PERRL, No scleral icterus; ENMT: Mouth and pharynx normal, Mucous membranes moist; Neck: Supple, Full range of motion, No lymphadenopathy; Cardiovascular: Tachycardic rate and rhythm, No gallop; Respiratory: Breath sounds coarse & equal bilaterally, scattered wheezes. No audible wheezing. +moist cough. Speaking full sentences with ease, Normal respiratory effort/excursion; Chest: Nontender, Movement normal; Abdomen: Soft, Nontender, Nondistended, Normal bowel sounds; Genitourinary: No CVA tenderness; Extremities: Pulses normal, +LUE AV fistula with palp thrill. No tenderness, No edema, No calf edema or asymmetry.; Neuro: AA&Ox3, Major CN grossly intact.  Speech clear. No gross focal motor or sensory deficits in extremities.; Skin: Color normal, Warm, Dry.     ED Treatments / Results  Labs (all labs ordered are listed, but only abnormal results are displayed)   EKG  EKG Interpretation  Date/Time:  Friday December 31 2016 19:49:56 EST Ventricular Rate:  98 PR Interval:    QRS Duration: 87 QT Interval:  380 QTC Calculation: 486 R Axis:   88 Text Interpretation:  Sinus rhythm Low voltage, precordial leads  Nonspecific T abnrm, anterolateral leads Borderline prolonged QT interval When compared with ECG of 08/11/2016 Nonspecific ST and T wave abnormality is now Present Confirmed by [facility]  MD, Nunzio Cory [phone]) on 12/31/2016 7:57:22 PM       Radiology   Procedures Procedures (including critical care  time)  Medications Ordered in ED Medications  0.9 %  sodium chloride infusion (not administered)  piperacillin-tazobactam (ZOSYN) IVPB 3.375 g (not administered)  doxycycline (VIBRAMYCIN) 100 mg in dextrose 5 % 250 mL IVPB (not administered)     Initial Impression / Assessment and Plan / ED Course  I have reviewed the triage vital signs and the nursing notes.  Pertinent labs & imaging results that were available during my care of the patient were reviewed by me and considered in my medical decision making (see chart for details).  MDM Reviewed: previous chart, nursing note and vitals Reviewed previous: labs and ECG Interpretation: labs, x-ray and ECG Total time providing critical care: 30-74 minutes. This excludes time spent performing separately reportable procedures and services. Consults: admitting MD   CRITICAL CARE Performed by: Alfonzo Feller Total critical care time: 35 minutes Critical care time was exclusive of separately billable procedures and treating other patients. Critical care was necessary to treat or prevent imminent or life-threatening deterioration. Critical care was time spent personally by me on the following activities: development of treatment plan with patient and/or surrogate as well as nursing, discussions with consultants, evaluation of patient's response to treatment, examination of patient, obtaining history from patient or surrogate, ordering and performing treatments and interventions, ordering and review of laboratory studies, ordering and review of radiographic studies, pulse oximetry and re-evaluation of patient's condition.   Results for orders  placed or performed during the hospital encounter of 12/31/16  Rapid strep screen  Result Value Ref Range   Streptococcus, Group A Screen (Direct) NEGATIVE NEGATIVE  Blood Culture (routine x 2)  Result Value Ref Range   Specimen Description RIGHT ANTECUBITAL    Special Requests BOTTLES DRAWN AEROBIC ONLY 6CC ONLY    Culture PENDING    Report Status PENDING   Blood Culture (routine x 2)  Result Value Ref Range   Specimen Description RIGHT ANTECUBITAL    Special Requests BOTTLES DRAWN AEROBIC AND ANAEROBIC [facility] EACH    Culture PENDING    Report Status PENDING   Comprehensive metabolic panel  Result Value Ref Range   Sodium 133 (L) 135 - 145 mmol/L   Potassium 3.4 (L) 3.5 - 5.1 mmol/L   Chloride 94 (L) 101 - 111 mmol/L   CO2 29 22 - 32 mmol/L   Glucose, Bld 126 (H) 65 - 99 mg/dL   BUN 20 6 - 20 mg/dL   Creatinine, Ser 3.46 (H) 0.44 - 1.00 mg/dL   Calcium 8.5 (L) 8.9 - 10.3 mg/dL   Total Protein 7.6 6.5 - 8.1 g/dL   Albumin 3.5 3.5 - 5.0 g/dL   AST 20 15 - 41 U/L   ALT 17 14 - 54 U/L   Alkaline Phosphatase 104 38 - 126 U/L   Total Bilirubin 0.5 0.3 - 1.2 mg/dL   GFR calc non Af Amer 14 (L) >60 mL/min   GFR calc Af Amer 17 (L) >60 mL/min   Anion gap 10 5 - 15  CBC WITH DIFFERENTIAL  Result Value Ref Range   WBC 9.9 4.0 - 10.5 K/uL   RBC 3.52 (L) 3.87 - 5.11 MIL/uL   Hemoglobin 11.2 (L) 12.0 - 15.0 g/dL   HCT 33.7 (L) 36.0 - 46.0 %   MCV 95.7 78.0 - 100.0 fL   MCH 31.8 26.0 - 34.0 pg   MCHC 33.2 30.0 - 36.0 g/dL   RDW 13.8 11.5 - 15.5 %   Platelets 220 150 - 400 K/uL   Neutrophils  Relative % 79 %   Neutro Abs 7.8 (H) 1.7 - 7.7 K/uL   Lymphocytes Relative 15 %   Lymphs Abs 1.5 0.7 - 4.0 K/uL   Monocytes Relative 4 %   Monocytes Absolute 0.4 0.1 - 1.0 K/uL   Eosinophils Relative 2 %   Eosinophils Absolute 0.2 0.0 - 0.7 K/uL   Basophils Relative 0 %   Basophils Absolute 0.0 0.0 - 0.1 K/uL  Influenza panel by PCR (type A & B, H1N1)  Result Value Ref Range   Influenza A  By PCR NEGATIVE NEGATIVE   Influenza B By PCR NEGATIVE NEGATIVE  I-Stat CG4 Lactic Acid, ED  (not at  [facility])  Result Value Ref Range   Lactic Acid, Venous 0.88 0.5 - 1.9 mmol/L   Dg Chest 2 View Result Date: 12/31/2016 CLINICAL DATA:  Code sepsis. Acute onset of cough, fever and shortness of breath. Initial encounter. EXAM: CHEST  2 VIEW COMPARISON:  Chest radiograph performed 08/11/2016 FINDINGS: The lungs are well-aerated. Vascular congestion is noted. Increased interstitial markings raise concern for pulmonary edema, though pneumonia could have a similar appearance. No pleural effusion or pneumothorax is seen. The heart is borderline enlarged. No acute osseous abnormalities are seen. IMPRESSION: Vascular congestion and borderline cardiomegaly. Increased interstitial markings raise concern for pulmonary edema, though pneumonia could have a similar appearance. Electronically Signed   By: Garald Balding M.D.   On: 12/31/2016 19:51    2110:  Code Sepsis called on pt's arrival: pt already received APAP by EMS en route. Neb tx ordered for wheezing with improvement. BP remains stable. IV abx ordered for pneumonia after BC and UC obtained. Dx and testing d/w pt and family.  Questions answered.  Verb understanding, agreeable to admit.  T/C to [facility]. [place] Comment, case discussed, including:  HPI, pertinent PM/SHx, VS/PE, dx testing, ED course and treatment:  Agreeable to admit, requests he will come to the ED for evaluation.    Final Clinical Impressions(s) / ED Diagnoses   Final diagnoses:  None    New Prescriptions New Prescriptions   No medications on file      Francine Graven, DO 01/04/17 1715

## 2016-12-31 NOTE — H&P (Signed)
History and Physical    [name] FMB:[id number] DOB: [birth year] DOA: 12/31/2016  PCP: Alonza Bogus, MD  Patient coming from: Home.    Chief Complaint:  Fever 102 and coughs.   HPI: [name] is an 51 y.o. female with hx of ESRD on HD (M W Fr), hx of anxiety, HTN, DM, morbid obesity with approximately 160 pound intentional weight loss since December 2016, lower extremity lymphedema with recurrent cellulitis. MSSE bacteremia, CAD, HLD, OHS, on chronic home oxygen 2 L/m, OSA on nightly CPAP, presented to the ER with temp 102 and non productive coughs.  Evaluation with CXR showed possible PNA vs edema.  She just had dialysis today.  She has no leukocytosis, and K of 3.4, and Cr of 3.4  Due to her multiple allergies, she was given IV Zosyn and IV Doxycycline as she has allergy to vancomycin. Hospitalist was asked to admit her for HCAP.   ED Course:  See above.  Rewiew of Systems:  Constitutional: Negative for malaise, fever and chills. No significant weight loss or weight gain Eyes: Negative for eye pain, redness and discharge, diplopia, visual changes, or flashes of light. ENMT: Negative for ear pain, hoarseness, nasal congestion, sinus pressure and sore throat. No headaches; tinnitus, drooling, or problem swallowing. Cardiovascular: Negative for chest pain, palpitations, diaphoresis,  and peripheral edema. ; No orthopnea, PND Respiratory: Negative for cough, hemoptysis, wheezing and stridor. No pleuritic chestpain. Gastrointestinal: Negative for diarrhea, constipation,  melena, blood in stool, hematemesis, jaundice and rectal bleeding.    Genitourinary: Negative for frequency, dysuria, incontinence,flank pain and hematuria; Musculoskeletal: Negative for back pain and neck pain. Negative for swelling and trauma.;  Skin: . Negative for pruritus, rash, abrasions, bruising and skin lesion.; ulcerations Neuro: Negative for headache, lightheadedness and neck stiffness. Negative for  weakness, altered level of consciousness , altered mental status, extremity weakness, burning feet, involuntary movement, seizure and syncope.  Psych: negative for anxiety, depression, insomnia, tearfulness, panic attacks, hallucinations, paranoia, suicidal or homicidal ideation    Past Medical History:  Diagnosis Date  . Anxiety   . Asthma    as a child  . Cancer ([place])    thyroid  . Cellulitis   . CHF (congestive heart failure) ([place])   . Chiari malformation    s/p surgery  . Chiari malformation   . Chronic pain   . Complication of anesthesia 11/28/15   Resp arrest after  conscious  sedation  . Constipation   . Coronary artery disease    40-50% mid LAD 04/29/09, Medical tx. (Dr. Gwenlyn Found)  . Diabetes mellitus    Type II  . Fibromyalgia   . History of blood transfusion    hemorrage duinrg pregancy  . Hx of echocardiogram 10/2011   EF 55-60%  . Hypercholesterolemia   . Hypertension   . Lymph edema   . Obesity hypoventilation syndrome ([place])   . On home O2   . Pneumonia    in past  . Renal disorder    Pt started dialysis in Dec.2016  . S/P colonoscopy 05/26/2007   Dr. Laural Golden sigmoid diverticulosis random biopsies benign  . S/P endoscopy 05/01/2009   Dr. Penelope Coop pill-induced esophageal ulcerations distal to midesophagus, 2 small ulcers in the antrum of the stomach  . Shortness of breath dyspnea    with any exertion or if heart rate  is irregular while on dialysis  . Sleep apnea     Past Surgical History:  Procedure Laterality Date  . Marland KitchenHemodialysis catheter  Right 11/28/2015  . ABDOMINAL HYSTERECTOMY    . ADENOIDECTOMY    . AV FISTULA PLACEMENT Left 03/15/2016   Procedure: CREATION OF LEFT ARM ARTERIOVENOUS (AV) FISTULA  ;  Surgeon: Rosetta Posner, MD;  Location: [facility];  Service: Vascular;  Laterality: Left;  . CESAREAN SECTION      x 2  . CRANIECTOMY SUBOCCIPITAL W/ CERVICAL LAMINECTOMY / CHIARI    . FISTULA SUPERFICIALIZATION Left 05/10/2016   Procedure: Left Arm FISTULA  SUPERFICIALIZATION;  Surgeon: Rosetta Posner, MD;  Location: [facility] OR;  Service: Vascular;  Laterality: Left;  . IR GENERIC HISTORICAL  08/14/2016   IR REMOVAL TUN ACCESS W/ PORT W/O FL MOD SED 08/14/2016 Arne Cleveland, MD [facility]  . PERIPHERAL VASCULAR CATHETERIZATION Left 11/25/2016   Procedure: A/V Fistulagram;  Surgeon: Conrad Cheshire, MD;  Location: [facility];  Service: Cardiovascular;  Laterality: Left;  arm  . PORTACATH PLACEMENT  07/05/2012   Procedure: INSERTION PORT-A-CATH;  Surgeon: Donato Heinz, MD;  Location: [facility];  Service: General;  Laterality: Left;  subclavian  . THYROIDECTOMY, PARTIAL    . TONSILLECTOMY       reports that she has never smoked. She has never used smokeless tobacco. She reports that she does not drink alcohol or use drugs.  Allergies  Allergen Reactions  . Contrast Media [Iodinated Diagnostic Agents] Anaphylaxis, Hives, Swelling and Other (See Comments)    Dye for cardiac cath. Tongue swells  . Pineapple Anaphylaxis, Itching, Swelling and Other (See Comments)    Pt able to eat now 11/25/16  . Pneumococcal Vaccines Swelling    Turns skin black, and bodily swelling  . Shellfish Allergy Itching, Swelling and Other (See Comments)    Facial swelling - Pt able to eat now 11/25/16  . Vancomycin Nausea And Vomiting and Other (See Comments)    Infusion "made me feel like I was dying" had to be readmitted to hospital    Family History  Problem Relation Age of Onset  . Colon cancer Mother 1  . Stroke Mother 70  . Coronary artery disease Mother      Prior to Admission medications   Medication Sig Start Date End Date Taking? Authorizing Provider  acetaminophen (TYLENOL) 325 MG tablet Take 650 mg by mouth every 6 (six) hours as needed for moderate pain or fever.     Historical Provider, MD  albuterol (PROVENTIL) (2.5 MG/3ML) 0.083% nebulizer solution Take 2.5 mg by nebulization every 6 (six) hours as needed for wheezing or shortness of breath.     Historical Provider, MD  ALPRAZolam Duanne Moron) 0.5 MG tablet Take 0.5 mg by mouth 3 (three) times daily as needed for anxiety or sleep.     Historical Provider, MD  amLODipine (NORVASC) 10 MG tablet Take 0.5 tablets (5 mg total) by mouth daily. 06/11/16   [name], MD  cinacalcet (SENSIPAR) 30 MG tablet Take 30 mg by mouth daily.    Historical Provider, MD  DAPTOmycin 1,000 mg in sodium chloride 0.9 % 100 mL Inject 1,000 mg into the vein every Tuesday, Thursday, and Saturday at 6 PM. To be given after hemodialysis. Total duration: 14 days. Start date: 08/13/16. Patient not taking: Reported on 11/25/2016 08/17/16   Modena Jansky, MD  diphenhydrAMINE (BENADRYL) 25 mg capsule Take 25 mg by mouth every 6 (six) hours as needed for allergies.    Historical Provider, MD  metoprolol (LOPRESSOR) 50 MG tablet Take 0.5 tablets (25 mg total) by mouth 2 (  two) times daily. 12/04/15   [name], MD  multivitamin (RENA-VIT) TABS tablet Take 1 tablet by mouth daily.    Historical Provider, MD  ondansetron (ZOFRAN-ODT) 4 MG disintegrating tablet Take 4 mg by mouth every 8 (eight) hours as needed for nausea or vomiting.    Historical Provider, MD  Oxycodone HCl 10 MG TABS Take 1 tablet (10 mg total) by mouth 3 (three) times daily as needed (pain). Patient taking differently: Take 10 mg by mouth 4 (four) times daily as needed (pain).  05/10/16   Alvia Grove, PA-C  sevelamer carbonate (RENVELA) 800 MG tablet Take 1,600-3,200 mg by mouth See admin instructions. Take 3200 mg by mouth 3 times daily with meals and take 1600 mg by mouth with snacks.    Historical Provider, MD    Physical Exam: Vitals:   12/31/16 1852 12/31/16 1857 12/31/16 2100 12/31/16 2111  BP: 121/62 111/57 100/63   Pulse: 109 107 96   Resp: _0 Temp: 100.8 F (38.2 C) 102 F (38.9 C)  100.6 F (38.1 C)  TempSrc: Oral Oral  Oral  SpO2: 99% 98% 98%   Weight: 134 kg (295 lb 6.7 oz)     Height: _1  (1.651 m)          Constitutional: NAD, calm, comfortable Vitals:   12/31/16 1852 12/31/16 1857 12/31/16 2100 12/31/16 2111  BP: 121/62 111/57 100/63   Pulse: 109 107 96   Resp: _2 Temp: 100.8 F (38.2 C) 102 F (38.9 C)  100.6 F (38.1 C)  TempSrc: Oral Oral  Oral  SpO2: 99% 98% 98%   Weight: 134 kg (295 lb 6.7 oz)     Height: _3  (1.651 m)      Eyes: PERRL, lids and conjunctivae normal ENMT: Mucous membranes are moist. Posterior pharynx clear of any exudate or lesions.Normal dentition.  Neck: normal, supple, no masses, no thyromegaly Respiratory: clear to auscultation bilaterally, no wheezing, no crackles. Normal respiratory effort. No accessory muscle use.  Cardiovascular: Regular rate and rhythm, no murmurs / rubs / gallops. No extremity edema. 2+ pedal pulses. No carotid bruits.  Abdomen: no tenderness, no masses palpated. No hepatosplenomegaly. Bowel sounds positive.  Musculoskeletal: no clubbing / cyanosis. No joint deformity upper and lower extremities. Good ROM, no contractures. Normal muscle tone.  Skin: no rashes, lesions, ulcers. No induration Neurologic: CN 2-12 grossly intact. Sensation intact, DTR normal. Strength 5/5 in all 4.  Psychiatric: Normal judgment and insight. Alert and oriented x 3. Normal mood.     Labs on Admission: I have personally reviewed following labs and imaging studies  CBC:  Recent Labs Lab 12/31/16 1908  WBC 9.9  NEUTROABS 7.8*  HGB 11.2*  HCT 33.7*  MCV 95.7  PLT 416   Basic Metabolic Panel:  Recent Labs Lab 12/31/16 1908  NA 133*  K 3.4*  CL 94*  CO2 29  GLUCOSE 126*  BUN 20  CREATININE 3.46*  CALCIUM 8.5*   GFR: Estimated Creatinine Clearance: 27 mL/min (by C-G formula based on SCr of 3.46 mg/dL (H)). Liver Function Tests:  Recent Labs Lab 12/31/16 1908  AST 20  ALT 17  ALKPHOS 104  BILITOT 0.5  PROT 7.6  ALBUMIN 3.5   Urine analysis:    Component Value Date/Time   COLORURINE YELLOW 08/11/2016 1800    APPEARANCEUR HAZY (A) 08/11/2016 1800   LABSPEC 1.020 08/11/2016 1800   PHURINE 5.5 08/11/2016 1800   GLUCOSEU NEGATIVE  08/11/2016 1800   HGBUR MODERATE (A) 08/11/2016 1800   BILIRUBINUR NEGATIVE 08/11/2016 1800   KETONESUR NEGATIVE 08/11/2016 1800   PROTEINUR 100 (A) 08/11/2016 1800   UROBILINOGEN 0.2 12/02/2014 1925   NITRITE NEGATIVE 08/11/2016 1800   LEUKOCYTESUR NEGATIVE 08/11/2016 1800    Recent Results (from the past 240 hour(s))  Rapid strep screen     Status: None   Collection Time: 12/31/16  7:03 PM  Result Value Ref Range Status   Streptococcus, Group A Screen (Direct) NEGATIVE NEGATIVE Final    Comment: (NOTE) A Rapid Antigen test may result negative if the antigen level in the sample is below the detection level of this test. The FDA has not cleared this test as a stand-alone test therefore the rapid antigen negative result has reflexed to a Group A Strep culture.   Blood Culture (routine x 2)     Status: None (Preliminary result)   Collection Time: 12/31/16  7:08 PM  Result Value Ref Range Status   Specimen Description RIGHT ANTECUBITAL  Final   Special Requests BOTTLES DRAWN AEROBIC ONLY 6CC ONLY  Final   Culture PENDING  Incomplete   Report Status PENDING  Incomplete  Blood Culture (routine x 2)     Status: None (Preliminary result)   Collection Time: 12/31/16  7:14 PM  Result Value Ref Range Status   Specimen Description RIGHT ANTECUBITAL  Final   Special Requests BOTTLES DRAWN AEROBIC AND ANAEROBIC [facility] EACH  Final   Culture PENDING  Incomplete   Report Status PENDING  Incomplete     Radiological Exams on Admission: Dg Chest 2 View  Result Date: 12/31/2016 CLINICAL DATA:  Code sepsis. Acute onset of cough, fever and shortness of breath. Initial encounter. EXAM: CHEST  2 VIEW COMPARISON:  Chest radiograph performed 08/11/2016 FINDINGS: The lungs are well-aerated. Vascular congestion is noted. Increased interstitial markings raise concern for pulmonary edema,  though pneumonia could have a similar appearance. No pleural effusion or pneumothorax is seen. The heart is borderline enlarged. No acute osseous abnormalities are seen. IMPRESSION: Vascular congestion and borderline cardiomegaly. Increased interstitial markings raise concern for pulmonary edema, though pneumonia could have a similar appearance. Electronically Signed   By: Garald Balding M.D.   On: 12/31/2016 19:51    EKG: Independently reviewed.   Assessment/Plan Active Problems:   Diabetes mellitus ([place])   Morbid obesity ([facility])   HTN (hypertension)   HLD (hyperlipidemia)   Chronic diastolic CHF (congestive heart failure) ([facility])   Chronic respiratory failure with hypoxia ([facility])   ESRD on dialysis ([place])   Anemia of chronic disease   Sepsis ([place])   HCAP (healthcare-associated pneumonia)    PLAN:   HCAP:  Given her high temperature, with coughs, and recent ultrafiltration (today), I suspect she has HCAP instead of vascular congestion.  Will continue with IV antibiotics.    DM:  Stable, will continue with SSI and CBG Q ac and Hs.  ESRD on HD: She will require routine dialysis on Monday.   HTN:  Given soft BP, will hold her anti HTN meds.  Give IVF gently.   Note she has hx of chronic diastolic CHF>   OSA:  Continue with CPAP Q hs.   DVT prophylaxis:  Heparin SQ Code Status: FULL CODE.  Family Communication: sister at bedside.  Disposition Plan: home when appropriate.  Consults called: None. Admission status: Inpatient.    Chayna Surratt MD FACP. [facility]  If 7PM-7AM, please contact night-coverage [email or web address] Password [facility]  12/31/2016, 9:24  PM  

## 2017-01-01 LAB — MRSA PCR SCREENING: MRSA by PCR: NEGATIVE

## 2017-01-01 LAB — CBC
HCT: 32.3 % — ABNORMAL LOW (ref 36.0–46.0)
Hemoglobin: 10.8 g/dL — ABNORMAL LOW (ref 12.0–15.0)
MCH: 32.2 pg (ref 26.0–34.0)
MCHC: 33.4 g/dL (ref 30.0–36.0)
MCV: 96.4 fL (ref 78.0–100.0)
PLATELETS: 211 10*3/uL (ref 150–400)
RBC: 3.35 MIL/uL — AB (ref 3.87–5.11)
RDW: 14 % (ref 11.5–15.5)
WBC: 9.3 10*3/uL (ref 4.0–10.5)

## 2017-01-01 LAB — COMPREHENSIVE METABOLIC PANEL
ALT: 17 U/L (ref 14–54)
AST: 20 U/L (ref 15–41)
Albumin: 3.5 g/dL (ref 3.5–5.0)
Alkaline Phosphatase: 96 U/L (ref 38–126)
Anion gap: 10 (ref 5–15)
BUN: 28 mg/dL — ABNORMAL HIGH (ref 6–20)
CHLORIDE: 96 mmol/L — AB (ref 101–111)
CO2: 28 mmol/L (ref 22–32)
CREATININE: 4.52 mg/dL — AB (ref 0.44–1.00)
Calcium: 8.3 mg/dL — ABNORMAL LOW (ref 8.9–10.3)
GFR calc Af Amer: 12 mL/min — ABNORMAL LOW (ref >60)
GFR calc non Af Amer: 10 mL/min — ABNORMAL LOW (ref >60)
Glucose, Bld: 157 mg/dL — ABNORMAL HIGH (ref 65–99)
POTASSIUM: 3.7 mmol/L (ref 3.5–5.1)
SODIUM: 134 mmol/L — AB (ref 135–145)
Total Bilirubin: 0.7 mg/dL (ref 0.3–1.2)
Total Protein: 7.3 g/dL (ref 6.5–8.1)

## 2017-01-01 MED ORDER — HYDROCODONE-HOMATROPINE 5-1.5 MG/5ML PO SYRP
5.0000 mL | ORAL_SOLUTION | ORAL | Status: DC | PRN
  Administered 2017-01-03 – 2017-01-04 (×2): 5 mL via ORAL
  Filled 2017-01-01 (×2): qty 5

## 2017-01-01 MED ORDER — SODIUM CHLORIDE 0.9 % IV SOLN
1000.0000 mL | INTRAVENOUS | Status: DC
  Administered 2017-01-03: 1000 mL via INTRAVENOUS

## 2017-01-01 MED ORDER — IPRATROPIUM-ALBUTEROL 0.5-2.5 (3) MG/3ML IN SOLN
3.0000 mL | RESPIRATORY_TRACT | Status: DC
  Administered 2017-01-01 – 2017-01-06 (×26): 3 mL via RESPIRATORY_TRACT
  Filled 2017-01-01 (×27): qty 3

## 2017-01-01 MED ORDER — DOXYCYCLINE HYCLATE 100 MG IV SOLR
INTRAVENOUS | Status: AC
  Filled 2017-01-01: qty 100

## 2017-01-01 MED ORDER — GUAIFENESIN ER 600 MG PO TB12
1200.0000 mg | ORAL_TABLET | Freq: Two times a day (BID) | ORAL | Status: DC
  Administered 2017-01-01 – 2017-01-06 (×11): 1200 mg via ORAL
  Filled 2017-01-01 (×11): qty 2

## 2017-01-01 MED ORDER — METHYLPREDNISOLONE SODIUM SUCC 40 MG IJ SOLR
40.0000 mg | Freq: Two times a day (BID) | INTRAMUSCULAR | Status: DC
  Administered 2017-01-01 – 2017-01-06 (×11): 40 mg via INTRAVENOUS
  Filled 2017-01-01 (×11): qty 1

## 2017-01-01 NOTE — Progress Notes (Signed)
Patient has IV fluids infusing.  Patient on dialysis with decreased output.  RN called Dr. [name] called and notified of above.  Dr. [name] gave order to decrease fluids to NS kvo.  Patient rate changed and oncoming nurse notified

## 2017-01-01 NOTE — Progress Notes (Signed)
Subjective: she was admitted  with HCAP.Marland Kitchen She says she feels better but she still coughing a lot. She's not coughing much up. She has no other new complaints. No nausea vomiting diarrhea.  Objective: Vital signs in last 24 hours: Temp:  [99.1 F (37.3 C)-102 F (38.9 C)] 99.1 F (37.3 C) (01/13 0615) Pulse Rate:  [92-109] 92 (01/13 0615) Resp:  [14-24] 18 (01/13 0615) BP: (100-121)/(57-80) 119/63 (01/13 0615) SpO2:  [94 %-100 %] 94 % (01/13 0855) Weight:  [134 kg (295 lb 6.7 oz)] 134 kg (295 lb 6.7 oz) (01/12 1852) Weight change:  Last BM Date: 12/29/16  Intake/Output from previous day: 01/12 0701 - 01/13 0700 In: 1516.7 [I.V.:1166.7; IV Piggyback:350] Out: -   PHYSICAL EXAM General appearance: alert, cooperative, moderate distress and morbidly obese Resp: rhonchi bilaterally Cardio: regular rate and rhythm, S1, S2 normal, no murmur, click, rub or gallop GI: soft, non-tender; bowel sounds normal; no masses,  no organomegaly Extremities: extremities normal, atraumatic, no cyanosis or edema Skin warm and dry. Pupils react  Lab Results:  Results for orders placed or performed during the hospital encounter of 12/31/16 (from the past 48 hour(s))  Rapid strep screen     Status: None   Collection Time: 12/31/16  7:03 PM  Result Value Ref Range   Streptococcus, Group A Screen (Direct) NEGATIVE NEGATIVE    Comment: (NOTE) A Rapid Antigen test may result negative if the antigen level in the sample is below the detection level of this test. The FDA has not cleared this test as a stand-alone test therefore the rapid antigen negative result has reflexed to a Group A Strep culture.   Comprehensive metabolic panel     Status: Abnormal   Collection Time: 12/31/16  7:08 PM  Result Value Ref Range   Sodium 133 (L) 135 - 145 mmol/L   Potassium 3.4 (L) 3.5 - 5.1 mmol/L   Chloride 94 (L) 101 - 111 mmol/L   CO2 29 22 - 32 mmol/L   Glucose, Bld 126 (H) 65 - 99 mg/dL   BUN 20 6 - 20 mg/dL    Creatinine, Ser 3.46 (H) 0.44 - 1.00 mg/dL   Calcium 8.5 (L) 8.9 - 10.3 mg/dL   Total Protein 7.6 6.5 - 8.1 g/dL   Albumin 3.5 3.5 - 5.0 g/dL   AST 20 15 - 41 U/L   ALT 17 14 - 54 U/L   Alkaline Phosphatase 104 38 - 126 U/L   Total Bilirubin 0.5 0.3 - 1.2 mg/dL   GFR calc non Af Amer 14 (L) >60 mL/min   GFR calc Af Amer 17 (L) >60 mL/min    Comment: (NOTE) The eGFR has been calculated using the CKD EPI equation. This calculation has not been validated in all clinical situations. eGFR's persistently <60 mL/min signify possible Chronic Kidney Disease.    Anion gap 10 5 - 15  CBC WITH DIFFERENTIAL     Status: Abnormal   Collection Time: 12/31/16  7:08 PM  Result Value Ref Range   WBC 9.9 4.0 - 10.5 K/uL   RBC 3.52 (L) 3.87 - 5.11 MIL/uL   Hemoglobin 11.2 (L) 12.0 - 15.0 g/dL   HCT 33.7 (L) 36.0 - 46.0 %   MCV 95.7 78.0 - 100.0 fL   MCH 31.8 26.0 - 34.0 pg   MCHC 33.2 30.0 - 36.0 g/dL   RDW 13.8 11.5 - 15.5 %   Platelets 220 150 - 400 K/uL   Neutrophils Relative % 79 %  Neutro Abs 7.8 (H) 1.7 - 7.7 K/uL   Lymphocytes Relative 15 %   Lymphs Abs 1.5 0.7 - 4.0 K/uL   Monocytes Relative 4 %   Monocytes Absolute 0.4 0.1 - 1.0 K/uL   Eosinophils Relative 2 %   Eosinophils Absolute 0.2 0.0 - 0.7 K/uL   Basophils Relative 0 %   Basophils Absolute 0.0 0.0 - 0.1 K/uL  Blood Culture (routine x 2)     Status: None (Preliminary result)   Collection Time: 12/31/16  7:08 PM  Result Value Ref Range   Specimen Description RIGHT ANTECUBITAL    Special Requests BOTTLES DRAWN AEROBIC ONLY 6CC ONLY    Culture PENDING    Report Status PENDING   Blood Culture (routine x 2)     Status: None (Preliminary result)   Collection Time: 12/31/16  7:14 PM  Result Value Ref Range   Specimen Description RIGHT ANTECUBITAL    Special Requests BOTTLES DRAWN AEROBIC AND ANAEROBIC 6CC EACH    Culture PENDING    Report Status PENDING   Influenza panel by PCR (type A & B, H1N1)     Status: None    Collection Time: 12/31/16  7:56 PM  Result Value Ref Range   Influenza A By PCR NEGATIVE NEGATIVE   Influenza B By PCR NEGATIVE NEGATIVE    Comment: (NOTE) The Xpert Xpress Flu assay is intended as an aid in the diagnosis of  influenza and should not be used as a sole basis for treatment.  This  assay is FDA approved for nasopharyngeal swab specimens only. Nasal  washings and aspirates are unacceptable for Xpert Xpress Flu testing.   I-Stat CG4 Lactic Acid, ED  (not at  [facility])     Status: None   Collection Time: 12/31/16  8:12 PM  Result Value Ref Range   Lactic Acid, Venous 0.88 0.5 - 1.9 mmol/L  MRSA PCR Screening     Status: None   Collection Time: 12/31/16 10:57 PM  Result Value Ref Range   MRSA by PCR NEGATIVE NEGATIVE    Comment:        The GeneXpert MRSA Assay (FDA approved for NASAL specimens only), is one component of a comprehensive MRSA colonization surveillance program. It is not intended to diagnose MRSA infection nor to guide or monitor treatment for MRSA infections.   Comprehensive metabolic panel     Status: Abnormal   Collection Time: 01/01/17  6:47 AM  Result Value Ref Range   Sodium 134 (L) 135 - 145 mmol/L   Potassium 3.7 3.5 - 5.1 mmol/L   Chloride 96 (L) 101 - 111 mmol/L   CO2 28 22 - 32 mmol/L   Glucose, Bld 157 (H) 65 - 99 mg/dL   BUN 28 (H) 6 - 20 mg/dL   Creatinine, Ser 4.52 (H) 0.44 - 1.00 mg/dL   Calcium 8.3 (L) 8.9 - 10.3 mg/dL   Total Protein 7.3 6.5 - 8.1 g/dL   Albumin 3.5 3.5 - 5.0 g/dL   AST 20 15 - 41 U/L   ALT 17 14 - 54 U/L   Alkaline Phosphatase 96 38 - 126 U/L   Total Bilirubin 0.7 0.3 - 1.2 mg/dL   GFR calc non Af Amer 10 (L) >60 mL/min   GFR calc Af Amer 12 (L) >60 mL/min    Comment: (NOTE) The eGFR has been calculated using the CKD EPI equation. This calculation has not been validated in all clinical situations. eGFR's persistently <60 mL/min signify possible  Chronic Kidney Disease.    Anion gap 10 5 - 15  CBC     Status:  Abnormal   Collection Time: 01/01/17  6:47 AM  Result Value Ref Range   WBC 9.3 4.0 - 10.5 K/uL   RBC 3.35 (L) 3.87 - 5.11 MIL/uL   Hemoglobin 10.8 (L) 12.0 - 15.0 g/dL   HCT 32.3 (L) 36.0 - 46.0 %   MCV 96.4 78.0 - 100.0 fL   MCH 32.2 26.0 - 34.0 pg   MCHC 33.4 30.0 - 36.0 g/dL   RDW 14.0 11.5 - 15.5 %   Platelets 211 150 - 400 K/uL    ABGS No results for input(s): PHART, PO2ART, TCO2, HCO3 in the last 72 hours.  Invalid input(s): PCO2 CULTURES Recent Results (from the past 240 hour(s))  Rapid strep screen     Status: None   Collection Time: 12/31/16  7:03 PM  Result Value Ref Range Status   Streptococcus, Group A Screen (Direct) NEGATIVE NEGATIVE Final    Comment: (NOTE) A Rapid Antigen test may result negative if the antigen level in the sample is below the detection level of this test. The FDA has not cleared this test as a stand-alone test therefore the rapid antigen negative result has reflexed to a Group A Strep culture.   Blood Culture (routine x 2)     Status: None (Preliminary result)   Collection Time: 12/31/16  7:08 PM  Result Value Ref Range Status   Specimen Description RIGHT ANTECUBITAL  Final   Special Requests BOTTLES DRAWN AEROBIC ONLY 6CC ONLY  Final   Culture PENDING  Incomplete   Report Status PENDING  Incomplete  Blood Culture (routine x 2)     Status: None (Preliminary result)   Collection Time: 12/31/16  7:14 PM  Result Value Ref Range Status   Specimen Description RIGHT ANTECUBITAL  Final   Special Requests BOTTLES DRAWN AEROBIC AND ANAEROBIC [facility] EACH  Final   Culture PENDING  Incomplete   Report Status PENDING  Incomplete  MRSA PCR Screening     Status: None   Collection Time: 12/31/16 10:57 PM  Result Value Ref Range Status   MRSA by PCR NEGATIVE NEGATIVE Final    Comment:        The GeneXpert MRSA Assay (FDA approved for NASAL specimens only), is one component of a comprehensive MRSA colonization surveillance program. It is  not intended to diagnose MRSA infection nor to guide or monitor treatment for MRSA infections.    Studies/Results: Dg Chest 2 View  Result Date: 12/31/2016 CLINICAL DATA:  Code sepsis. Acute onset of cough, fever and shortness of breath. Initial encounter. EXAM: CHEST  2 VIEW COMPARISON:  Chest radiograph performed 08/11/2016 FINDINGS: The lungs are well-aerated. Vascular congestion is noted. Increased interstitial markings raise concern for pulmonary edema, though pneumonia could have a similar appearance. No pleural effusion or pneumothorax is seen. The heart is borderline enlarged. No acute osseous abnormalities are seen. IMPRESSION: Vascular congestion and borderline cardiomegaly. Increased interstitial markings raise concern for pulmonary edema, though pneumonia could have a similar appearance. Electronically Signed   By: Garald Balding M.D.   On: 12/31/2016 19:51    Medications:  Prior to Admission:  Prescriptions Prior to Admission  Medication Sig Dispense Refill Last Dose  . acetaminophen (TYLENOL) 325 MG tablet Take 650 mg by mouth every 6 (six) hours as needed for moderate pain or fever.    12/31/2016 at Unknown time  . albuterol (PROVENTIL) (2.5 MG/3ML)  0.083% nebulizer solution Take 2.5 mg by nebulization every 6 (six) hours as needed for wheezing or shortness of breath.   Past Month at Unknown time  . ALPRAZolam (XANAX) 0.5 MG tablet Take 0.5 mg by mouth 3 (three) times daily as needed for anxiety or sleep.    12/28/2016 at Unknown time  . amLODipine (NORVASC) 10 MG tablet Take 0.5 tablets (5 mg total) by mouth daily. 15 tablet 12 12/30/2016 at Unknown time  . cinacalcet (SENSIPAR) 30 MG tablet Take 30 mg by mouth daily.   12/30/2016 at Unknown time  . diphenhydrAMINE (BENADRYL) 25 mg capsule Take 25 mg by mouth every 6 (six) hours as needed for allergies.   unknown  . metoprolol (LOPRESSOR) 50 MG tablet Take 0.5 tablets (25 mg total) by mouth 2 (two) times daily. 30 tablet 12  12/30/2016 at 1800p  . multivitamin (RENA-VIT) TABS tablet Take 1 tablet by mouth daily.   12/30/2016 at Unknown time  . Oxycodone HCl 10 MG TABS Take 1 tablet (10 mg total) by mouth 3 (three) times daily as needed (pain). (Patient taking differently: Take 10 mg by mouth 4 (four) times daily as needed (pain). ) 15 tablet 0 12/29/2016 at Unknown time  . sevelamer carbonate (RENVELA) 800 MG tablet Take 1,600-3,200 mg by mouth See admin instructions. Take 3200 mg by mouth 3 times daily with meals and take 1600 mg by mouth with snacks.   12/30/2016 at Unknown time   Scheduled: . cinacalcet  30 mg Oral Q breakfast  . doxycycline (VIBRAMYCIN) IV  100 mg Intravenous Q12H  . guaiFENesin  1,200 mg Oral BID  . heparin  5,000 Units Subcutaneous Q8H  . ipratropium-albuterol  3 mL Nebulization Q4H  . methylPREDNISolone (SOLU-MEDROL) injection  40 mg Intravenous Q12H  . multivitamin  1 tablet Oral Daily  . piperacillin-tazobactam (ZOSYN)  IV  3.375 g Intravenous Q12H  . sevelamer carbonate  1,600 mg Oral With snacks  . sevelamer carbonate  3,200 mg Oral TID WC   Continuous: . sodium chloride 1,000 mL (12/31/16 2035)   XLK:GMWNUUVOZDGUY **OR** acetaminophen, acetaminophen, albuterol, ALPRAZolam, HYDROcodone-homatropine, ondansetron, oxyCODONE  Assesment: She was admitted with healthcare associated pneumonia. She is better but still coughing. No new complaints. She's not coughing anything up. She has end-stage renal disease on dialysis. She has chronic hypoxic respiratory failure which is stable. She is septic but that's better. Active Problems:   Diabetes mellitus ([place])   Morbid obesity (H[facility])   HTN (hypertension)   HLD (hyperlipidemia)   Chronic diastolic CHF (congestive heart failure) (H[facility])   Chronic respiratory failure with hypoxia (H[facility])   ESRD on dialysis ([place])   Anemia of chronic disease   Sepsis ([place])   HCAP (healthcare-associated pneumonia)    Plan: Continue current treatments. No other new  treatments. Add medication for cough.    LOS: 1 day   Siniyah Evangelist L 01/01/2017, 10:21 AM

## 2017-01-01 NOTE — Consult Note (Signed)
JADIA CAPERS MRN: [id number] DOB/AGE: [birth year] 51 y.o. Primary Care Physician:HAWKINS,EDWARD L, MD Admit date: 12/31/2016 Chief Complaint:  Chief Complaint  Patient presents with  . Shortness of Breath   HPI: Pt is  a 51 year old  African American  female with past medical hx of ESRD on HD (Mon/Wed/Fri) who presented to the ER with temp 102 and non productive coughs.  HPI dates back to past few days when pt stared having cough, non productive. Pt also c/o cough. Pt c/o feeling malaise Pt c/o nausea Pt came to ER as she was not feeling good, upon evaluation in ER. Pt had CXR which showed possible PNA .Pt was  admitted her for HCAP.  Pt seen on 3rd floor, Pt says " I still have cough"   Past Medical History:  Diagnosis Date  . Anxiety   . Asthma    as a child  . Cancer ([place])    thyroid  . Cellulitis   . CHF (congestive heart failure) ([place])   . Chiari malformation    s/p surgery  . Chiari malformation   . Chronic pain   . Complication of anesthesia 11/28/15   Resp arrest after  conscious  sedation  . Constipation   . Coronary artery disease    40-50% mid LAD 04/29/09, Medical tx. (Dr. Gwenlyn Found)  . Diabetes mellitus    Type II  . Fibromyalgia   . History of blood transfusion    hemorrage duinrg pregancy  . Hx of echocardiogram 10/2011   EF 55-60%  . Hypercholesterolemia   . Hypertension   . Lymph edema   . Obesity hypoventilation syndrome ([place])   . On home O2   . Pneumonia    in past  . Renal disorder    Pt started dialysis in Dec.2016  . S/P colonoscopy 05/26/2007   Dr. Laural Golden sigmoid diverticulosis random biopsies benign  . S/P endoscopy 05/01/2009   Dr. Penelope Coop pill-induced esophageal ulcerations distal to midesophagus, 2 small ulcers in the antrum of the stomach  . Shortness of breath dyspnea    with any exertion or if heart rate  is irregular while on dialysis  . Sleep apnea        Family History  Problem Relation Age of Onset  . Colon cancer Mother  11  . Stroke Mother 85  . Coronary artery disease Mother     Social History:  reports that she has never smoked. She has never used smokeless tobacco. She reports that she does not drink alcohol or use drugs.   Allergies:  Allergies  Allergen Reactions  . Contrast Media [Iodinated Diagnostic Agents] Anaphylaxis, Hives, Swelling and Other (See Comments)    Dye for cardiac cath. Tongue swells  . Pneumococcal Vaccines Swelling    Turns skin black, and bodily swelling  . Shellfish Allergy Itching, Swelling and Other (See Comments)    Facial swelling - Pt able to eat now 11/25/16  . Vancomycin Nausea And Vomiting and Other (See Comments)    Infusion "made me feel like I was dying" had to be readmitted to hospital    Medications Prior to Admission  Medication Sig Dispense Refill  . acetaminophen (TYLENOL) 325 MG tablet Take 650 mg by mouth every 6 (six) hours as needed for moderate pain or fever.     Marland Kitchen albuterol (PROVENTIL) (2.5 MG/3ML) 0.083% nebulizer solution Take 2.5 mg by nebulization every 6 (six) hours as needed for wheezing or shortness of breath.    . ALPRAZolam (  XANAX) 0.5 MG tablet Take 0.5 mg by mouth 3 (three) times daily as needed for anxiety or sleep.     Marland Kitchen amLODipine (NORVASC) 10 MG tablet Take 0.5 tablets (5 mg total) by mouth daily. 15 tablet 12  . cinacalcet (SENSIPAR) 30 MG tablet Take 30 mg by mouth daily.    . diphenhydrAMINE (BENADRYL) 25 mg capsule Take 25 mg by mouth every 6 (six) hours as needed for allergies.    . metoprolol (LOPRESSOR) 50 MG tablet Take 0.5 tablets (25 mg total) by mouth 2 (two) times daily. 30 tablet 12  . multivitamin (RENA-VIT) TABS tablet Take 1 tablet by mouth daily.    . Oxycodone HCl 10 MG TABS Take 1 tablet (10 mg total) by mouth 3 (three) times daily as needed (pain). (Patient taking differently: Take 10 mg by mouth 4 (four) times daily as needed (pain). ) 15 tablet 0  . sevelamer carbonate (RENVELA) 800 MG tablet Take 1,600-3,200 mg by  mouth See admin instructions. Take 3200 mg by mouth 3 times daily with meals and take 1600 mg by mouth with snacks.         FGH:WEXHB from the symptoms mentioned above,there are no other symptoms referable to all systems reviewed.  . cinacalcet  30 mg Oral Q breakfast  . doxycycline (VIBRAMYCIN) IV  100 mg Intravenous Q12H  . guaiFENesin  1,200 mg Oral BID  . heparin  5,000 Units Subcutaneous Q8H  . ipratropium-albuterol  3 mL Nebulization Q4H  . methylPREDNISolone (SOLU-MEDROL) injection  40 mg Intravenous Q12H  . multivitamin  1 tablet Oral Daily  . piperacillin-tazobactam (ZOSYN)  IV  3.375 g Intravenous Q12H  . sevelamer carbonate  1,600 mg Oral With snacks  . sevelamer carbonate  3,200 mg Oral TID WC        Physical Exam: Vital signs in last 24 hours: Temp:  [99.1 F (37.3 C)-102 F (38.9 C)] 99.1 F (37.3 C) (01/13 0615) Pulse Rate:  [92-109] 92 (01/13 0615) Resp:  [14-24] 18 (01/13 0615) BP: (100-121)/(57-80) 119/63 (01/13 0615) SpO2:  [94 %-100 %] 94 % (01/13 1243) Weight:  [295 lb 6.7 oz (134 kg)] 295 lb 6.7 oz (134 kg) (01/12 1852) Weight change:  Last BM Date: 12/29/16  Intake/Output from previous day: 01/12 0701 - 01/13 0700 In: 1516.7 [I.V.:1166.7; IV Piggyback:350] Out: -  Total I/O In: 120 [P.O.:120] Out: -    Physical Exam: General- pt is awake,alert, oriented to time place and person Resp- No acute REsp distress,  Rhonchi present CVS- S1S2 regular ij rate and rhythm GIT- BS+, soft, NT, ND, obese EXT- NO LE Edema, Cyanosis CNS- CN 2-12 grossly intact. Moving all 4 extremities Psych- normal mod and affect Access-AVF   Lab Results: CBC  Recent Labs  12/31/16 1908 01/01/17 0647  WBC 9.9 9.3  HGB 11.2* 10.8*  HCT 33.7* 32.3*  PLT 220 211    BMET  Recent Labs  12/31/16 1908 01/01/17 0647  NA 133* 134*  K 3.4* 3.7  CL 94* 96*  CO2 29 28  GLUCOSE 126* 157*  BUN 20 28*  CREATININE 3.46* 4.52*  CALCIUM 8.5* 8.3*     MICRO Recent Results (from the past 240 hour(s))  Rapid strep screen     Status: None   Collection Time: 12/31/16  7:03 PM  Result Value Ref Range Status   Streptococcus, Group A Screen (Direct) NEGATIVE NEGATIVE Final    Comment: (NOTE) A Rapid Antigen test may result negative if the antigen  level in the sample is below the detection level of this test. The FDA has not cleared this test as a stand-alone test therefore the rapid antigen negative result has reflexed to a Group A Strep culture.   Blood Culture (routine x 2)     Status: None (Preliminary result)   Collection Time: 12/31/16  7:08 PM  Result Value Ref Range Status   Specimen Description RIGHT ANTECUBITAL  Final   Special Requests BOTTLES DRAWN AEROBIC ONLY 6CC ONLY  Final   Culture PENDING  Incomplete   Report Status PENDING  Incomplete  Blood Culture (routine x 2)     Status: None (Preliminary result)   Collection Time: 12/31/16  7:14 PM  Result Value Ref Range Status   Specimen Description RIGHT ANTECUBITAL  Final   Special Requests BOTTLES DRAWN AEROBIC AND ANAEROBIC [facility] EACH  Final   Culture PENDING  Incomplete   Report Status PENDING  Incomplete  MRSA PCR Screening     Status: None   Collection Time: 12/31/16 10:57 PM  Result Value Ref Range Status   MRSA by PCR NEGATIVE NEGATIVE Final    Comment:        The GeneXpert MRSA Assay (FDA approved for NASAL specimens only), is one component of a comprehensive MRSA colonization surveillance program. It is not intended to diagnose MRSA infection nor to guide or monitor treatment for MRSA infections.       Lab Results  Component Value Date   PTH 127 (H) 11/29/2015   CALCIUM 8.3 (L) 01/01/2017   CAION 1.22 11/25/2016   PHOS 6.2 (H) 08/14/2016      Impression: 1)Renal  ESRD on HD                Pt is on Mon/Wed/Friday schedule                Pt was last dialyzed yesterday.                No nee of Hd today.   2)HTN BP at goal  3)Anemia IN  ESRD the target HGb goal  Is 9--11. hgb at goal Will keep on epo  4)CKD Mineral-Bone Disorder Secondary Hyperparathyroidism  present      On sensipar Phosphorus not at goal.      On binders  5)ID-admitted with Pneumonia  PMD following  6)Elecvtrolytes  Normokalemic  Hyponatremic Sec to ESRD  7)Acid base Co2 at goal   8) Morbid obesity-Pt is actively trying to loose weight.      Encouraged pt to continue to do the same.   Plan:  Agree with current tx and plan NO need of HD today       Taino Maertens S 01/01/2017, 1:34 PM

## 2017-01-02 MED ORDER — EPOETIN ALFA 2000 UNIT/ML IJ SOLN
2000.0000 [IU] | INTRAMUSCULAR | Status: DC
  Administered 2017-01-03 – 2017-01-05 (×2): 2000 [IU] via SUBCUTANEOUS
  Filled 2017-01-02 (×3): qty 1

## 2017-01-02 NOTE — Progress Notes (Signed)
Subjective: She was admitted with pneumonia. At baseline she has end-stage renal disease on dialysis morbid obesity with obesity hypoventilation. She is better but is still coughing nonproductively. She is moderately short of breath with exertion  Objective: Vital signs in last 24 hours: Temp:  [97.8 F (36.6 C)-98.9 F (37.2 C)] 97.8 F (36.6 C) (01/14 0543) Pulse Rate:  [79-81] 79 (01/14 0543) Resp:  [18] 18 (01/14 0543) BP: (112-121)/(61-65) 121/65 (01/14 0543) SpO2:  [93 %-97 %] 93 % (01/14 0735) Weight change:  Last BM Date: 12/29/16  Intake/Output from previous day: 01/13 0701 - 01/14 0700 In: 982.5 [P.O.:360; I.V.:72.5; IV Piggyback:550] Out: 250 [Urine:250]  PHYSICAL EXAM General appearance: alert, cooperative, mild distress and morbidly obese Resp: rhonchi bilaterally Cardio: regular rate and rhythm, S1, S2 normal, no murmur, click, rub or gallop GI: soft, non-tender; bowel sounds normal; no masses,  no organomegaly Extremities: She has chronic lymphedema Pupils react. Mucous membranes are moist  Lab Results:  Results for orders placed or performed during the hospital encounter of 12/31/16 (from the past 48 hour(s))  Rapid strep screen     Status: None   Collection Time: 12/31/16  7:03 PM  Result Value Ref Range   Streptococcus, Group A Screen (Direct) NEGATIVE NEGATIVE    Comment: (NOTE) A Rapid Antigen test may result negative if the antigen level in the sample is below the detection level of this test. The FDA has not cleared this test as a stand-alone test therefore the rapid antigen negative result has reflexed to a Group A Strep culture.   Comprehensive metabolic panel     Status: Abnormal   Collection Time: 12/31/16  7:08 PM  Result Value Ref Range   Sodium 133 (L) 135 - 145 mmol/L   Potassium 3.4 (L) 3.5 - 5.1 mmol/L   Chloride 94 (L) 101 - 111 mmol/L   CO2 29 22 - 32 mmol/L   Glucose, Bld 126 (H) 65 - 99 mg/dL   BUN 20 6 - 20 mg/dL   Creatinine,  Ser 3.46 (H) 0.44 - 1.00 mg/dL   Calcium 8.5 (L) 8.9 - 10.3 mg/dL   Total Protein 7.6 6.5 - 8.1 g/dL   Albumin 3.5 3.5 - 5.0 g/dL   AST 20 15 - 41 U/L   ALT 17 14 - 54 U/L   Alkaline Phosphatase 104 38 - 126 U/L   Total Bilirubin 0.5 0.3 - 1.2 mg/dL   GFR calc non Af Amer 14 (L) >60 mL/min   GFR calc Af Amer 17 (L) >60 mL/min    Comment: (NOTE) The eGFR has been calculated using the CKD EPI equation. This calculation has not been validated in all clinical situations. eGFR's persistently <60 mL/min signify possible Chronic Kidney Disease.    Anion gap 10 5 - 15  CBC WITH DIFFERENTIAL     Status: Abnormal   Collection Time: 12/31/16  7:08 PM  Result Value Ref Range   WBC 9.9 4.0 - 10.5 K/uL   RBC 3.52 (L) 3.87 - 5.11 MIL/uL   Hemoglobin 11.2 (L) 12.0 - 15.0 g/dL   HCT 33.7 (L) 36.0 - 46.0 %   MCV 95.7 78.0 - 100.0 fL   MCH 31.8 26.0 - 34.0 pg   MCHC 33.2 30.0 - 36.0 g/dL   RDW 13.8 11.5 - 15.5 %   Platelets 220 150 - 400 K/uL   Neutrophils Relative % 79 %   Neutro Abs 7.8 (H) 1.7 - 7.7 K/uL   Lymphocytes Relative 15 %  Lymphs Abs 1.5 0.7 - 4.0 K/uL   Monocytes Relative 4 %   Monocytes Absolute 0.4 0.1 - 1.0 K/uL   Eosinophils Relative 2 %   Eosinophils Absolute 0.2 0.0 - 0.7 K/uL   Basophils Relative 0 %   Basophils Absolute 0.0 0.0 - 0.1 K/uL  Blood Culture (routine x 2)     Status: None (Preliminary result)   Collection Time: 12/31/16  7:08 PM  Result Value Ref Range   Specimen Description RIGHT ANTECUBITAL    Special Requests BOTTLES DRAWN AEROBIC ONLY 6CC ONLY    Culture NO GROWTH 2 DAYS    Report Status PENDING   Blood Culture (routine x 2)     Status: None (Preliminary result)   Collection Time: 12/31/16  7:14 PM  Result Value Ref Range   Specimen Description RIGHT ANTECUBITAL    Special Requests BOTTLES DRAWN AEROBIC AND ANAEROBIC 6CC EACH    Culture NO GROWTH 2 DAYS    Report Status PENDING   Influenza panel by PCR (type A & B, H1N1)     Status: None    Collection Time: 12/31/16  7:56 PM  Result Value Ref Range   Influenza A By PCR NEGATIVE NEGATIVE   Influenza B By PCR NEGATIVE NEGATIVE    Comment: (NOTE) The Xpert Xpress Flu assay is intended as an aid in the diagnosis of  influenza and should not be used as a sole basis for treatment.  This  assay is FDA approved for nasopharyngeal swab specimens only. Nasal  washings and aspirates are unacceptable for Xpert Xpress Flu testing.   I-Stat CG4 Lactic Acid, ED  (not at  [facility])     Status: None   Collection Time: 12/31/16  8:12 PM  Result Value Ref Range   Lactic Acid, Venous 0.88 0.5 - 1.9 mmol/L  MRSA PCR Screening     Status: None   Collection Time: 12/31/16 10:57 PM  Result Value Ref Range   MRSA by PCR NEGATIVE NEGATIVE    Comment:        The GeneXpert MRSA Assay (FDA approved for NASAL specimens only), is one component of a comprehensive MRSA colonization surveillance program. It is not intended to diagnose MRSA infection nor to guide or monitor treatment for MRSA infections.   Comprehensive metabolic panel     Status: Abnormal   Collection Time: 01/01/17  6:47 AM  Result Value Ref Range   Sodium 134 (L) 135 - 145 mmol/L   Potassium 3.7 3.5 - 5.1 mmol/L   Chloride 96 (L) 101 - 111 mmol/L   CO2 28 22 - 32 mmol/L   Glucose, Bld 157 (H) 65 - 99 mg/dL   BUN 28 (H) 6 - 20 mg/dL   Creatinine, Ser 4.52 (H) 0.44 - 1.00 mg/dL   Calcium 8.3 (L) 8.9 - 10.3 mg/dL   Total Protein 7.3 6.5 - 8.1 g/dL   Albumin 3.5 3.5 - 5.0 g/dL   AST 20 15 - 41 U/L   ALT 17 14 - 54 U/L   Alkaline Phosphatase 96 38 - 126 U/L   Total Bilirubin 0.7 0.3 - 1.2 mg/dL   GFR calc non Af Amer 10 (L) >60 mL/min   GFR calc Af Amer 12 (L) >60 mL/min    Comment: (NOTE) The eGFR has been calculated using the CKD EPI equation. This calculation has not been validated in all clinical situations. eGFR's persistently <60 mL/min signify possible Chronic Kidney Disease.    Anion gap 10 5 -  15  CBC     Status:  Abnormal   Collection Time: 01/01/17  6:47 AM  Result Value Ref Range   WBC 9.3 4.0 - 10.5 K/uL   RBC 3.35 (L) 3.87 - 5.11 MIL/uL   Hemoglobin 10.8 (L) 12.0 - 15.0 g/dL   HCT 32.3 (L) 36.0 - 46.0 %   MCV 96.4 78.0 - 100.0 fL   MCH 32.2 26.0 - 34.0 pg   MCHC 33.4 30.0 - 36.0 g/dL   RDW 14.0 11.5 - 15.5 %   Platelets 211 150 - 400 K/uL    ABGS No results for input(s): PHART, PO2ART, TCO2, HCO3 in the last 72 hours.  Invalid input(s): PCO2 CULTURES Recent Results (from the past 240 hour(s))  Rapid strep screen     Status: None   Collection Time: 12/31/16  7:03 PM  Result Value Ref Range Status   Streptococcus, Group A Screen (Direct) NEGATIVE NEGATIVE Final    Comment: (NOTE) A Rapid Antigen test may result negative if the antigen level in the sample is below the detection level of this test. The FDA has not cleared this test as a stand-alone test therefore the rapid antigen negative result has reflexed to a Group A Strep culture.   Blood Culture (routine x 2)     Status: None (Preliminary result)   Collection Time: 12/31/16  7:08 PM  Result Value Ref Range Status   Specimen Description RIGHT ANTECUBITAL  Final   Special Requests BOTTLES DRAWN AEROBIC ONLY 6CC ONLY  Final   Culture NO GROWTH 2 DAYS  Final   Report Status PENDING  Incomplete  Blood Culture (routine x 2)     Status: None (Preliminary result)   Collection Time: 12/31/16  7:14 PM  Result Value Ref Range Status   Specimen Description RIGHT ANTECUBITAL  Final   Special Requests BOTTLES DRAWN AEROBIC AND ANAEROBIC 6CC EACH  Final   Culture NO GROWTH 2 DAYS  Final   Report Status PENDING  Incomplete  MRSA PCR Screening     Status: None   Collection Time: 12/31/16 10:57 PM  Result Value Ref Range Status   MRSA by PCR NEGATIVE NEGATIVE Final    Comment:        The GeneXpert MRSA Assay (FDA approved for NASAL specimens only), is one component of a comprehensive MRSA colonization surveillance program. It is  not intended to diagnose MRSA infection nor to guide or monitor treatment for MRSA infections.    Studies/Results: Dg Chest 2 View  Result Date: 12/31/2016 CLINICAL DATA:  Code sepsis. Acute onset of cough, fever and shortness of breath. Initial encounter. EXAM: CHEST  2 VIEW COMPARISON:  Chest radiograph performed 08/11/2016 FINDINGS: The lungs are well-aerated. Vascular congestion is noted. Increased interstitial markings raise concern for pulmonary edema, though pneumonia could have a similar appearance. No pleural effusion or pneumothorax is seen. The heart is borderline enlarged. No acute osseous abnormalities are seen. IMPRESSION: Vascular congestion and borderline cardiomegaly. Increased interstitial markings raise concern for pulmonary edema, though pneumonia could have a similar appearance. Electronically Signed   By: Garald Balding M.D.   On: 12/31/2016 19:51    Medications:  Prior to Admission:  Prescriptions Prior to Admission  Medication Sig Dispense Refill Last Dose  . acetaminophen (TYLENOL) 325 MG tablet Take 650 mg by mouth every 6 (six) hours as needed for moderate pain or fever.    12/31/2016 at Unknown time  . albuterol (PROVENTIL) (2.5 MG/3ML) 0.083% nebulizer solution Take 2.5  mg by nebulization every 6 (six) hours as needed for wheezing or shortness of breath.   Past Month at Unknown time  . ALPRAZolam (XANAX) 0.5 MG tablet Take 0.5 mg by mouth 3 (three) times daily as needed for anxiety or sleep.    12/28/2016 at Unknown time  . amLODipine (NORVASC) 10 MG tablet Take 0.5 tablets (5 mg total) by mouth daily. 15 tablet 12 12/30/2016 at Unknown time  . cinacalcet (SENSIPAR) 30 MG tablet Take 30 mg by mouth daily.   12/30/2016 at Unknown time  . diphenhydrAMINE (BENADRYL) 25 mg capsule Take 25 mg by mouth every 6 (six) hours as needed for allergies.   unknown  . metoprolol (LOPRESSOR) 50 MG tablet Take 0.5 tablets (25 mg total) by mouth 2 (two) times daily. 30 tablet 12  12/30/2016 at 1800p  . multivitamin (RENA-VIT) TABS tablet Take 1 tablet by mouth daily.   12/30/2016 at Unknown time  . Oxycodone HCl 10 MG TABS Take 1 tablet (10 mg total) by mouth 3 (three) times daily as needed (pain). (Patient taking differently: Take 10 mg by mouth 4 (four) times daily as needed (pain). ) 15 tablet 0 12/29/2016 at Unknown time  . sevelamer carbonate (RENVELA) 800 MG tablet Take 1,600-3,200 mg by mouth See admin instructions. Take 3200 mg by mouth 3 times daily with meals and take 1600 mg by mouth with snacks.   12/30/2016 at Unknown time   Scheduled: . cinacalcet  30 mg Oral Q breakfast  . doxycycline (VIBRAMYCIN) IV  100 mg Intravenous Q12H  . guaiFENesin  1,200 mg Oral BID  . heparin  5,000 Units Subcutaneous Q8H  . ipratropium-albuterol  3 mL Nebulization Q4H  . methylPREDNISolone (SOLU-MEDROL) injection  40 mg Intravenous Q12H  . multivitamin  1 tablet Oral Daily  . piperacillin-tazobactam (ZOSYN)  IV  3.375 g Intravenous Q12H  . sevelamer carbonate  1,600 mg Oral With snacks  . sevelamer carbonate  3,200 mg Oral TID WC   Continuous: . sodium chloride 1,000 mL (01/01/17 1945)   NPY:[id number] **OR** acetaminophen, acetaminophen, albuterol, ALPRAZolam, HYDROcodone-homatropine, ondansetron, oxyCODONE  Assesment: She was admitted with healthcare associated pneumonia and appear to be septic on admission. She has chronic hypoxic respiratory failure with acute exacerbation. She has end-stage renal disease on dialysis. She is generally improved but she still has significant cough and shortness of breath and she's not ready for discharge yet. Active Problems:   Diabetes mellitus ([place])   Morbid obesity ([facility])   HTN (hypertension)   HLD (hyperlipidemia)   Chronic diastolic CHF (congestive heart failure) ([facility])   Chronic respiratory failure with hypoxia ([facility])   ESRD on dialysis ([place])   Anemia of chronic disease   Sepsis ([place])   HCAP (healthcare-associated  pneumonia)    Plan: Continue current medications and treatments.    LOS: 2 days   Meliana Canner L 01/02/2017, 9:56 AM

## 2017-01-02 NOTE — Progress Notes (Addendum)
[name]  MRN: [id number]  DOB/AGE: August 02, 1966 51 y.o.  Primary Care Physician:[name],Lindsay L, MD  Admit date: 12/31/2016  Chief Complaint:  Chief Complaint  Patient presents with  . Shortness of Breath    S-Pt presented on  12/31/2016 with  Chief Complaint  Patient presents with  . Shortness of Breath  .    Pt offers no new concerns.    Pt says " my cough is better"   meds . cinacalcet  30 mg Oral Q breakfast  . doxycycline (VIBRAMYCIN) IV  100 mg Intravenous Q12H  . guaiFENesin  1,200 mg Oral BID  . heparin  5,000 Units Subcutaneous Q8H  . ipratropium-albuterol  3 mL Nebulization Q4H  . methylPREDNISolone (SOLU-MEDROL) injection  40 mg Intravenous Q12H  . multivitamin  1 tablet Oral Daily  . piperacillin-tazobactam (ZOSYN)  IV  3.375 g Intravenous Q12H  . sevelamer carbonate  1,600 mg Oral With snacks  . sevelamer carbonate  3,200 mg Oral TID WC     Physical Exam: Vital signs in last 24 hours: Temp:  [97.8 F (36.6 C)-98.9 F (37.2 C)] 97.8 F (36.6 C) (01/14 0543) Pulse Rate:  [79-81] 79 (01/14 0543) Resp:  [18] 18 (01/14 0543) BP: (112-121)/(61-65) 121/65 (01/14 0543) SpO2:  [93 %-97 %] 93 % (01/14 0735) Weight change:  Last BM Date: 12/29/16  Intake/Output from previous day: 01/13 0701 - 01/14 0700 In: 982.5 [P.O.:360; I.V.:72.5; IV Piggyback:550] Out: 250 [Urine:250] No intake/output data recorded.   Physical Exam: General- pt is awake,alert, following commands. Resp- No acute REsp distress, ronchi present CVS- S1S2 regular in rate and rhythm GIT- BS+, soft, NT, Morbidly obese EXT- Chronic  LE lympheedema, NO Cyanosis, chronic venous stasis changes Access- AVF  Lab Results: CBC    Component Value Date/Time   WBC 9.3 01/01/2017 0647   RBC 3.35 (L) 01/01/2017 0647   HGB 10.8 (L) 01/01/2017 0647   HCT 32.3 (L) 01/01/2017 0647   PLT 211 01/01/2017 0647   MCV 96.4 01/01/2017 0647   MCH 32.2 01/01/2017 0647   MCHC 33.4 01/01/2017 0647    RDW 14.0 01/01/2017 0647   LYMPHSABS 1.5 12/31/2016 1908   MONOABS 0.4 12/31/2016 1908   EOSABS 0.2 12/31/2016 1908   BASOSABS 0.0 12/31/2016 1908       BMET  Recent Labs  12/31/16 1908 01/01/17 0647  NA 133* 134*  K 3.4* 3.7  CL 94* 96*  CO2 29 28  GLUCOSE 126* 157*  BUN 20 28*  CREATININE 3.46* 4.52*  CALCIUM 8.5* 8.3*     MICRO Recent Results (from the past 240 hour(s))  Rapid strep screen     Status: None   Collection Time: 12/31/16  7:03 PM  Result Value Ref Range Status   Streptococcus, Group A Screen (Direct) NEGATIVE NEGATIVE Final    Comment: (NOTE) A Rapid Antigen test may result negative if the antigen level in the sample is below the detection level of this test. The FDA has not cleared this test as a stand-alone test therefore the rapid antigen negative result has reflexed to a Group A Strep culture.   Blood Culture (routine x 2)     Status: None (Preliminary result)   Collection Time: 12/31/16  7:08 PM  Result Value Ref Range Status   Specimen Description RIGHT ANTECUBITAL  Final   Special Requests BOTTLES DRAWN AEROBIC ONLY 6CC ONLY  Final   Culture NO GROWTH 2 DAYS  Final   Report Status PENDING  Incomplete  Blood Culture (routine x 2)     Status: None (Preliminary result)   Collection Time: 12/31/16  7:14 PM  Result Value Ref Range Status   Specimen Description RIGHT ANTECUBITAL  Final   Special Requests BOTTLES DRAWN AEROBIC AND ANAEROBIC 6CC EACH  Final   Culture NO GROWTH 2 DAYS  Final   Report Status PENDING  Incomplete  MRSA PCR Screening     Status: None   Collection Time: 12/31/16 10:57 PM  Result Value Ref Range Status   MRSA by PCR NEGATIVE NEGATIVE Final    Comment:        The GeneXpert MRSA Assay (FDA approved for NASAL specimens only), is one component of a comprehensive MRSA colonization surveillance program. It is not intended to diagnose MRSA infection nor to guide or monitor treatment for MRSA infections.        Lab Results  Component Value Date   PTH 127 (H) 11/29/2015   CALCIUM 8.3 (L) 01/01/2017   CAION 1.22 11/25/2016   PHOS 6.2 (H) 08/14/2016       Impression: 1)Renal  ESRD on HD                Pt is on Mon/Wed/Friday schedule                Pt was last dialyzed on  Friday.                No need of Hd today.   2)HTN BP at goal  3)Anemia IN ESRD the target HGb goal  Is 9--11. hgb at goal Will keep on epo  4)CKD Mineral-Bone Disorder Secondary Hyperparathyroidism  present      On sensipar Phosphorus not at goal.      On binders  5)ID-admitted with Pneumonia  PMD following  6)Electrolytes  Normokalemic  Hyponatremic Sec to ESRD  7)Acid base Co2 at goal   8) Morbid obesity-Pt is actively trying to loose weight.      Encouraged pt to continue to do the same.   Plan:  Will continue current care Will dialyze in am if still inpt.     [name] S 01/02/2017, 8:30 AM

## 2017-01-03 LAB — CULTURE, GROUP A STREP (THRC)

## 2017-01-03 LAB — BASIC METABOLIC PANEL
Anion gap: 15 (ref 5–15)
BUN: 67 mg/dL — ABNORMAL HIGH (ref 6–20)
CO2: 22 mmol/L (ref 22–32)
Calcium: 8 mg/dL — ABNORMAL LOW (ref 8.9–10.3)
Chloride: 90 mmol/L — ABNORMAL LOW (ref 101–111)
Creatinine, Ser: 6.79 mg/dL — ABNORMAL HIGH (ref 0.44–1.00)
GFR calc Af Amer: 7 mL/min — ABNORMAL LOW (ref >60)
GFR calc non Af Amer: 6 mL/min — ABNORMAL LOW (ref >60)
Glucose, Bld: 333 mg/dL — ABNORMAL HIGH (ref 65–99)
Potassium: 4.7 mmol/L (ref 3.5–5.1)
Sodium: 127 mmol/L — ABNORMAL LOW (ref 135–145)

## 2017-01-03 LAB — CBC
HCT: 32.6 % — ABNORMAL LOW (ref 36.0–46.0)
Hemoglobin: 10.6 g/dL — ABNORMAL LOW (ref 12.0–15.0)
MCH: 31.2 pg (ref 26.0–34.0)
MCHC: 32.5 g/dL (ref 30.0–36.0)
MCV: 95.9 fL (ref 78.0–100.0)
Platelets: 191 10*3/uL (ref 150–400)
RBC: 3.4 MIL/uL — ABNORMAL LOW (ref 3.87–5.11)
RDW: 13 % (ref 11.5–15.5)
WBC: 10.1 10*3/uL (ref 4.0–10.5)

## 2017-01-03 LAB — PHOSPHORUS: Phosphorus: 6.3 mg/dL — ABNORMAL HIGH (ref 2.5–4.6)

## 2017-01-03 MED ORDER — HEPARIN SODIUM (PORCINE) 1000 UNIT/ML IJ SOLN
INTRAMUSCULAR | Status: AC
  Administered 2017-01-03: 2700 [IU] via INTRAVENOUS_CENTRAL
  Filled 2017-01-03: qty 6

## 2017-01-03 MED ORDER — EPOETIN ALFA 4000 UNIT/ML IJ SOLN
INTRAMUSCULAR | Status: AC
  Administered 2017-01-03: 2000 [IU]
  Filled 2017-01-03: qty 1

## 2017-01-03 MED ORDER — LIDOCAINE-PRILOCAINE 2.5-2.5 % EX CREA: 1.0000 "application " | TOPICAL_CREAM | CUTANEOUS | Status: DC | PRN

## 2017-01-03 MED ORDER — SALINE SPRAY 0.65 % NA SOLN
1.0000 | NASAL | Status: DC | PRN
  Administered 2017-01-04: 1 via NASAL
  Filled 2017-01-03: qty 44

## 2017-01-03 MED ORDER — LIDOCAINE HCL (PF) 1 % IJ SOLN: 5.0000 mL | INTRAMUSCULAR | Status: DC | PRN

## 2017-01-03 MED ORDER — SODIUM CHLORIDE 0.9 % IV SOLN: 100.0000 mL | INTRAVENOUS | Status: DC | PRN

## 2017-01-03 MED ORDER — PENTAFLUOROPROP-TETRAFLUOROETH EX AERO: 1.0000 "application " | INHALATION_SPRAY | CUTANEOUS | Status: DC | PRN

## 2017-01-03 MED ORDER — HEPARIN SODIUM (PORCINE) 1000 UNIT/ML DIALYSIS
20.0000 [IU]/kg | INTRAMUSCULAR | Status: DC | PRN
  Filled 2017-01-03: qty 3

## 2017-01-03 NOTE — Progress Notes (Addendum)
[name]  MRN: [id number]  DOB/AGE: May 16, 1966 51 y.o.  Primary Care Physician:Lindsay L, MD  Admit date: 12/31/2016  Chief Complaint:  Chief Complaint  Patient presents with  . Shortness of Breath    [name]-Pt presented on  12/31/2016 with  Chief Complaint  Patient presents with  . Shortness of Breath  .    Pt offers no new concerns.    Pt says " my cough is better"     Pt main query was " When will I get my dialysis today?"  meds . cinacalcet  30 mg Oral Q breakfast  . doxycycline (VIBRAMYCIN) IV  100 mg Intravenous Q12H  . epoetin (EPOGEN/PROCRIT) injection  2,000 Units Subcutaneous Q M,W,F-HD  . guaiFENesin  1,200 mg Oral BID  . heparin  5,000 Units Subcutaneous Q8H  . ipratropium-albuterol  3 mL Nebulization Q4H  . methylPREDNISolone (SOLU-MEDROL) injection  40 mg Intravenous Q12H  . multivitamin  1 tablet Oral Daily  . piperacillin-tazobactam (ZOSYN)  IV  3.375 g Intravenous Q12H  . sevelamer carbonate  1,600 mg Oral With snacks  . sevelamer carbonate  3,200 mg Oral TID WC     Physical Exam: Vital signs in last 24 hours: Temp:  [98 F (36.7 C)-98.6 F (37 C)] 98 F (36.7 C) (01/15 0500) Pulse Rate:  [60-65] 60 (01/15 0500) Resp:  [18-23] 23 (01/15 0500) BP: (119-129)/(62-72) 129/72 (01/15 0500) SpO2:  [94 %-99 %] 99 % (01/15 0814) FiO2 (%):  [28 %] 28 % (01/14 2239) Weight:  [307 lb 1.6 oz (139.3 kg)] 307 lb 1.6 oz (139.3 kg) (01/15 0500) Weight change:  Last BM Date: 12/29/16  Intake/Output from previous day: 01/14 0701 - 01/15 0700 In: 360 [P.O.:360] Out: 1 [Urine:1] No intake/output data recorded.   Physical Exam: General- pt is awake,alert, following commands. Resp- No acute REsp distress, ronchi present CVS- S1S2 regular in rate and rhythm GIT- BS+, soft, NT, Morbidly obese EXT- Chronic  LE lympheedema, NO Cyanosis, chronic venous stasis changes Access- AVF  Lab Results: CBC    Component Value Date/Time   WBC 10.1 01/03/2017  0541   RBC 3.40 ([name]) 01/03/2017 0541   HGB 10.6 ([name]) 01/03/2017 0541   HCT 32.6 ([name]) 01/03/2017 0541   PLT 191 01/03/2017 0541   MCV 95.9 01/03/2017 0541   MCH 31.2 01/03/2017 0541   MCHC 32.5 01/03/2017 0541   RDW 13.0 01/03/2017 0541   LYMPHSABS 1.5 12/31/2016 1908   MONOABS 0.4 12/31/2016 1908   EOSABS 0.2 12/31/2016 1908   BASOSABS 0.0 12/31/2016 1908       BMET  Recent Labs  01/01/17 0647 01/03/17 0541  NA 134* 127*  K 3.7 4.7  CL 96* 90*  CO2 28 22  GLUCOSE 157* 333*  BUN 28* 67*  CREATININE 4.52* 6.79*  CALCIUM 8.3* 8.0*     MICRO Recent Results (from the past 240 hour([name]))  Rapid strep screen     Status: None   Collection Time: 12/31/16  7:03 PM  Result Value Ref Range Status   Streptococcus, Group A Screen (Direct) NEGATIVE NEGATIVE Final    Comment: (NOTE) A Rapid Antigen test may result negative if the antigen level in the sample is below the detection level of this test. The FDA has not cleared this test as a stand-alone test therefore the rapid antigen negative result has reflexed to a Group A Strep culture.   Culture, group A strep     Status: None (Preliminary result)   Collection  Time: 12/31/16  7:03 PM  Result Value Ref Range Status   Specimen Description THROAT  Final   Special Requests NONE Reflexed from [id number]  Final   Culture   Final    CULTURE REINCUBATED FOR BETTER GROWTH Performed at [name]    Report Status PENDING  Incomplete  Blood Culture (routine x 2)     Status: None (Preliminary result)   Collection Time: 12/31/16  7:08 PM  Result Value Ref Range Status   Specimen Description RIGHT ANTECUBITAL  Final   Special Requests BOTTLES DRAWN AEROBIC ONLY 6CC ONLY  Final   Culture NO GROWTH 3 DAYS  Final   Report Status PENDING  Incomplete  Blood Culture (routine x 2)     Status: None (Preliminary result)   Collection Time: 12/31/16  7:14 PM  Result Value Ref Range Status   Specimen Description RIGHT ANTECUBITAL  Final    Special Requests BOTTLES DRAWN AEROBIC AND ANAEROBIC 6CC EACH  Final   Culture NO GROWTH 3 DAYS  Final   Report Status PENDING  Incomplete  MRSA PCR Screening     Status: None   Collection Time: 12/31/16 10:57 PM  Result Value Ref Range Status   MRSA by PCR NEGATIVE NEGATIVE Final    Comment:        The GeneXpert MRSA Assay (FDA approved for NASAL specimens only), is one component of a comprehensive MRSA colonization surveillance program. It is not intended to diagnose MRSA infection nor to guide or monitor treatment for MRSA infections.       Lab Results  Component Value Date   PTH 127 (H) 11/29/2015   CALCIUM 8.0 ([name]) 01/03/2017   CAION 1.22 11/25/2016   PHOS 6.3 (H) 01/03/2017       Impression: 1)Renal  ESRD on HD                Pt is on Mon/Wed/Friday schedule               will dialyze today  2)HTN BP at goal  3)Anemia IN ESRD the target HGb goal  Is 9--11. hgb at goal Will keep on epo  4)CKD Mineral-Bone Disorder Secondary Hyperparathyroidism  present      On sensipar Phosphorus not at goal.      On binders  5)ID-admitted with Pneumonia  PMD following  6)Electrolytes  Normokalemic  Hyponatremic Sec to ESRD  7)Acid base Co2 at goal   8) Morbid obesity-Pt is actively trying to loose weight.      Encouraged pt to continue to do the same.   Plan:  Will dialyze today  Addendum Pt seen on HD   [name] 01/03/2017, 9:18 AM

## 2017-01-03 NOTE — Progress Notes (Signed)
Subjective: She says she feels better. No new complaints. She is scheduled for dialysis later today. Her cough is less and still mostly nonproductive. She is still somewhat short of breath when she moves around. She's not been up. No nausea vomiting diarrhea. No chest pain.  Objective: Vital signs in last 24 hours: Temp:  [98 F (36.7 C)-98.6 F (37 C)] 98 F (36.7 C) (01/15 0500) Pulse Rate:  [60-65] 60 (01/15 0500) Resp:  [18-23] 23 (01/15 0500) BP: (119-129)/(62-72) 129/72 (01/15 0500) SpO2:  [94 %-99 %] 99 % (01/15 0814) FiO2 (%):  [28 %] 28 % (01/14 2239) Weight:  [139.3 kg (307 lb 1.6 oz)] 139.3 kg (307 lb 1.6 oz) (01/15 0500) Weight change:  Last BM Date: 12/29/16  Intake/Output from previous day: 01/14 0701 - 01/15 0700 In: 360 [P.O.:360] Out: 1 [Urine:1]  PHYSICAL EXAM General appearance: alert, cooperative, mild distress and morbidly obese Resp: rhonchi bilaterally Cardio: regular rate and rhythm, S1, S2 normal, no murmur, click, rub or gallop GI: soft, non-tender; bowel sounds normal; no masses,  no organomegaly Extremities: Chronic lymphedema of both legs Skin warm and dry. Mucous membranes are moist  Lab Results:  Results for orders placed or performed during the hospital encounter of 12/31/16 (from the past 48 hour(s))  Basic metabolic panel     Status: Abnormal   Collection Time: 01/03/17  5:41 AM  Result Value Ref Range   Sodium 127 (L) 135 - 145 mmol/L    Comment: DELTA CHECK NOTED   Potassium 4.7 3.5 - 5.1 mmol/L   Chloride 90 (L) 101 - 111 mmol/L   CO2 22 22 - 32 mmol/L   Glucose, Bld 333 (H) 65 - 99 mg/dL   BUN 67 (H) 6 - 20 mg/dL   Creatinine, Ser 6.79 (H) 0.44 - 1.00 mg/dL   Calcium 8.0 (L) 8.9 - 10.3 mg/dL   GFR calc non Af Amer 6 (L) >60 mL/min   GFR calc Af Amer 7 (L) >60 mL/min    Comment: (NOTE) The eGFR has been calculated using the CKD EPI equation. This calculation has not been validated in all clinical situations. eGFR's persistently  <60 mL/min signify possible Chronic Kidney Disease.    Anion gap 15 5 - 15  CBC     Status: Abnormal   Collection Time: 01/03/17  5:41 AM  Result Value Ref Range   WBC 10.1 4.0 - 10.5 K/uL   RBC 3.40 (L) 3.87 - 5.11 MIL/uL   Hemoglobin 10.6 (L) 12.0 - 15.0 g/dL   HCT 32.6 (L) 36.0 - 46.0 %   MCV 95.9 78.0 - 100.0 fL   MCH 31.2 26.0 - 34.0 pg   MCHC 32.5 30.0 - 36.0 g/dL   RDW 13.0 11.5 - 15.5 %   Platelets 191 150 - 400 K/uL  Phosphorus     Status: Abnormal   Collection Time: 01/03/17  5:41 AM  Result Value Ref Range   Phosphorus 6.3 (H) 2.5 - 4.6 mg/dL    ABGS No results for input(s): PHART, PO2ART, TCO2, HCO3 in the last 72 hours.  Invalid input(s): PCO2 CULTURES Recent Results (from the past 240 hour(s))  Rapid strep screen     Status: None   Collection Time: 12/31/16  7:03 PM  Result Value Ref Range Status   Streptococcus, Group A Screen (Direct) NEGATIVE NEGATIVE Final    Comment: (NOTE) A Rapid Antigen test may result negative if the antigen level in the sample is below the detection level of  this test. The FDA has not cleared this test as a stand-alone test therefore the rapid antigen negative result has reflexed to a Group A Strep culture.   Culture, group A strep     Status: None (Preliminary result)   Collection Time: 12/31/16  7:03 PM  Result Value Ref Range Status   Specimen Description THROAT  Final   Special Requests NONE Reflexed from [id number]  Final   Culture   Final    CULTURE REINCUBATED FOR BETTER GROWTH Performed at [facility]    Report Status PENDING  Incomplete  Blood Culture (routine x 2)     Status: None (Preliminary result)   Collection Time: 12/31/16  7:08 PM  Result Value Ref Range Status   Specimen Description RIGHT ANTECUBITAL  Final   Special Requests BOTTLES DRAWN AEROBIC ONLY 6CC ONLY  Final   Culture NO GROWTH 3 DAYS  Final   Report Status PENDING  Incomplete  Blood Culture (routine x 2)     Status: None (Preliminary  result)   Collection Time: 12/31/16  7:14 PM  Result Value Ref Range Status   Specimen Description RIGHT ANTECUBITAL  Final   Special Requests BOTTLES DRAWN AEROBIC AND ANAEROBIC 6CC EACH  Final   Culture NO GROWTH 3 DAYS  Final   Report Status PENDING  Incomplete  MRSA PCR Screening     Status: None   Collection Time: 12/31/16 10:57 PM  Result Value Ref Range Status   MRSA by PCR NEGATIVE NEGATIVE Final    Comment:        The GeneXpert MRSA Assay (FDA approved for NASAL specimens only), is one component of a comprehensive MRSA colonization surveillance program. It is not intended to diagnose MRSA infection nor to guide or monitor treatment for MRSA infections.    Studies/Results: No results found.  Medications:  Prior to Admission:  Prescriptions Prior to Admission  Medication Sig Dispense Refill Last Dose  . acetaminophen (TYLENOL) 325 MG tablet Take 650 mg by mouth every 6 (six) hours as needed for moderate pain or fever.    12/31/2016 at Unknown time  . albuterol (PROVENTIL) (2.5 MG/3ML) 0.083% nebulizer solution Take 2.5 mg by nebulization every 6 (six) hours as needed for wheezing or shortness of breath.   Past Month at Unknown time  . ALPRAZolam (XANAX) 0.5 MG tablet Take 0.5 mg by mouth 3 (three) times daily as needed for anxiety or sleep.    12/28/2016 at Unknown time  . amLODipine (NORVASC) 10 MG tablet Take 0.5 tablets (5 mg total) by mouth daily. 15 tablet 12 12/30/2016 at Unknown time  . cinacalcet (SENSIPAR) 30 MG tablet Take 30 mg by mouth daily.   12/30/2016 at Unknown time  . diphenhydrAMINE (BENADRYL) 25 mg capsule Take 25 mg by mouth every 6 (six) hours as needed for allergies.   unknown  . metoprolol (LOPRESSOR) 50 MG tablet Take 0.5 tablets (25 mg total) by mouth 2 (two) times daily. 30 tablet 12 12/30/2016 at 1800p  . multivitamin (RENA-VIT) TABS tablet Take 1 tablet by mouth daily.   12/30/2016 at Unknown time  . Oxycodone HCl 10 MG TABS Take 1 tablet (10 mg  total) by mouth 3 (three) times daily as needed (pain). (Patient taking differently: Take 10 mg by mouth 4 (four) times daily as needed (pain). ) 15 tablet 0 12/29/2016 at Unknown time  . sevelamer carbonate (RENVELA) 800 MG tablet Take 1,600-3,200 mg by mouth See admin instructions. Take 3200 mg by mouth   3 times daily with meals and take 1600 mg by mouth with snacks.   12/30/2016 at Unknown time   Scheduled: . cinacalcet  30 mg Oral Q breakfast  . doxycycline (VIBRAMYCIN) IV  100 mg Intravenous Q12H  . epoetin (EPOGEN/PROCRIT) injection  2,000 Units Subcutaneous Q M,W,F-HD  . guaiFENesin  1,200 mg Oral BID  . heparin  5,000 Units Subcutaneous Q8H  . ipratropium-albuterol  3 mL Nebulization Q4H  . methylPREDNISolone (SOLU-MEDROL) injection  40 mg Intravenous Q12H  . multivitamin  1 tablet Oral Daily  . piperacillin-tazobactam (ZOSYN)  IV  3.375 g Intravenous Q12H  . sevelamer carbonate  1,600 mg Oral With snacks  . sevelamer carbonate  3,200 mg Oral TID WC   Continuous: . sodium chloride 1,000 mL (01/03/17 0520)   PRN:acetaminophen **OR** acetaminophen, acetaminophen, albuterol, ALPRAZolam, HYDROcodone-homatropine, ondansetron, oxyCODONE  Assesment: She was admitted with healthcare associated pneumonia and mild sepsis from that. She is much improved. She has acute on chronic hypoxic respiratory failure. She has obesity with some element of obesity hypoventilation. She has chronic diastolic heart failure managed by dialysis. She has end-stage renal disease on dialysis and is scheduled for dialysis today. Active Problems:   Diabetes mellitus (HCC)   Morbid obesity (HCC)   HTN (hypertension)   HLD (hyperlipidemia)   Chronic diastolic CHF (congestive heart failure) (HCC)   Chronic respiratory failure with hypoxia (HCC)   ESRD on dialysis (HCC)   Anemia of chronic disease   Sepsis (HCC)   HCAP (healthcare-associated pneumonia)    Plan: Dialysis today. Home in the next 48 hours    LOS:  3 days   HAWKINS,EDWARD L 01/03/2017, 8:50 AM  

## 2017-01-03 NOTE — Progress Notes (Signed)
Inpatient Diabetes Program Recommendations  AACE/ADA: New Consensus Statement on Inpatient Glycemic Control (2015)  Target Ranges:  Prepandial:   less than 140 mg/dL      Peak postprandial:   less than 180 mg/dL (1-2 hours)      Critically ill patients:  140 - 180 mg/dL   Lab Results  Component Value Date   GLUCAP 133 (H) 08/15/2016   HGBA1C 7.3 (H) 06/07/2016    Review of Glycemic Control Results for [name], [name] (MRN [id number]) as of 01/03/2017 09:29  Ref. Range 01/03/2017 05:41  Glucose Latest Ref Range: 65 - 99 mg/dL 333 (H)   Diabetes history: DM2 Outpatient Diabetes medications: None Current orders for Inpatient glycemic control: None  Inpatient Diabetes Program Recommendations:  Lab glucose this am 333.  Please consider: -A1c to determine prehospital glycemic control -Novolog correction 0-9 units tid + 0-5 units hs  Thank you, Bethena Roys E. Lenor Provencher, RN, MSN, CDE Inpatient Glycemic Control Team Team Pager [phone] (8am-5pm) 01/03/2017 10:01 AM

## 2017-01-03 NOTE — Progress Notes (Signed)
Pharmacy Antibiotic Note  [name] is a 51 y.o. female admitted on 12/31/2016 with sepsis.  Pharmacy has been consulted for Zosyn and doxycycline dosing. Day#4 abx. Patient is feeling better. Cough is less, nonproductive. Afebrile, WBC normal.   Plan: Contine Zosyn 3.375gm IV x 1 in ED then, 3.375gm IV q12h EID , infused over 4 hours Continue Doxycycline 140m IV q12h F/U cxs and clinical progress Transition to po as indicated  Height: _0  (165.1 cm) Weight: (!) 307 lb 1.6 oz (139.3 kg) IBW/kg (Calculated) : 57  Temp (24hrs), Avg:98.3 F (36.8 C), Min:98 F (36.7 C), Max:98.6 F (37 C)   Recent Labs Lab 12/31/16 1908 12/31/16 2012 01/01/17 0647 01/03/17 0541  WBC 9.9  --  9.3 10.1  CREATININE 3.46*  --  4.52* 6.79*  LATICACIDVEN  --  0.88  --   --     Estimated Creatinine Clearance: 14.1 mL/min (by C-G formula based on SCr of 6.79 mg/dL (H)).    Allergies  Allergen Reactions  . Contrast Media [Iodinated Diagnostic Agents] Anaphylaxis, Hives, Swelling and Other (See Comments)    Dye for cardiac cath. Tongue swells  . Pneumococcal Vaccines Swelling    Turns skin black, and bodily swelling  . Shellfish Allergy Itching, Swelling and Other (See Comments)    Facial swelling - Pt able to eat now 11/25/16  . Vancomycin Nausea And Vomiting and Other (See Comments)    Infusion "made me feel like I was dying" had to be readmitted to hospital    Antimicrobials this admission: Zosyn 1/12 >> Doxycycline 1/12 >>   Microbiology results: 1/12 BCx: ngtd 1/12 Influenzae panel is negative 1/12 Grp A Strep: pending   Thank you for allowing pharmacy to be a part of this patient's care.  LIsac Sarna BS Pharm D, BCaliforniaClinical Pharmacist Pager #[phone]/15/2018 9:58 AM

## 2017-01-04 LAB — GLUCOSE, CAPILLARY
Glucose-Capillary: 280 mg/dL — ABNORMAL HIGH (ref 65–99)
Glucose-Capillary: 334 mg/dL — ABNORMAL HIGH (ref 65–99)
Glucose-Capillary: 231 mg/dL — ABNORMAL HIGH (ref 65–99)

## 2017-01-04 MED ORDER — INSULIN ASPART 100 UNIT/ML ~~LOC~~ SOLN
0.0000 [IU] | Freq: Three times a day (TID) | SUBCUTANEOUS | Status: DC
  Administered 2017-01-04: 15 [IU] via SUBCUTANEOUS
  Administered 2017-01-04: 11 [IU] via SUBCUTANEOUS
  Administered 2017-01-05: 3 [IU] via SUBCUTANEOUS
  Administered 2017-01-05 – 2017-01-06 (×2): 7 [IU] via SUBCUTANEOUS

## 2017-01-04 MED ORDER — INSULIN ASPART 100 UNIT/ML ~~LOC~~ SOLN
0.0000 [IU] | Freq: Every day | SUBCUTANEOUS | Status: DC
  Administered 2017-01-04 – 2017-01-05 (×2): 2 [IU] via SUBCUTANEOUS

## 2017-01-04 MED ORDER — BISACODYL 5 MG PO TBEC
10.0000 mg | DELAYED_RELEASE_TABLET | Freq: Once | ORAL | Status: AC
  Administered 2017-01-04: 10 mg via ORAL
  Filled 2017-01-04: qty 2

## 2017-01-04 NOTE — Progress Notes (Signed)
Subjective: She is still very weak. She still short of breath and coughing. She got up some yesterday but was pretty short of breath with minimal exertion. She's not having any nausea or vomiting. No other new complaints.  Objective: Vital signs in last 24 hours: Temp:  [98 F (36.7 C)-98.3 F (36.8 C)] 98.3 F (36.8 C) (01/16 0500) Pulse Rate:  [66-84] 72 (01/16 0500) Resp:  [18-20] 18 (01/16 0500) BP: (111-149)/(56-92) 124/69 (01/16 0500) SpO2:  [94 %-98 %] 94 % (01/16 0816) Weight:  [139.5 kg (307 lb 8.7 oz)] 139.5 kg (307 lb 8.7 oz) (01/15 1250) Weight change: 0.2 kg (7.1 oz) Last BM Date: 12/29/16  Intake/Output from previous day: 01/15 0701 - 01/16 0700 In: 1742.2 [P.O.:240; I.V.:422.2; IV Piggyback:1080] Out: -   PHYSICAL EXAM General appearance: alert, cooperative, mild distress and morbidly obese Resp: rhonchi bilaterally Cardio: regular rate and rhythm, S1, S2 normal, no murmur, click, rub or gallop GI: soft, non-tender; bowel sounds normal; no masses,  no organomegaly Extremities: Chronic lymphedema bilaterally Mucous membranes are moist. Pupils react  Lab Results:  Results for orders placed or performed during the hospital encounter of 12/31/16 (from the past 48 hour(s))  Basic metabolic panel     Status: Abnormal   Collection Time: 01/03/17  5:41 AM  Result Value Ref Range   Sodium 127 (L) 135 - 145 mmol/L    Comment: DELTA CHECK NOTED   Potassium 4.7 3.5 - 5.1 mmol/L   Chloride 90 (L) 101 - 111 mmol/L   CO2 22 22 - 32 mmol/L   Glucose, Bld 333 (H) 65 - 99 mg/dL   BUN 67 (H) 6 - 20 mg/dL   Creatinine, Ser 6.79 (H) 0.44 - 1.00 mg/dL   Calcium 8.0 (L) 8.9 - 10.3 mg/dL   GFR calc non Af Amer 6 (L) >60 mL/min   GFR calc Af Amer 7 (L) >60 mL/min    Comment: (NOTE) The eGFR has been calculated using the CKD EPI equation. This calculation has not been validated in all clinical situations. eGFR's persistently <60 mL/min signify possible Chronic  Kidney Disease.    Anion gap 15 5 - 15  CBC     Status: Abnormal   Collection Time: 01/03/17  5:41 AM  Result Value Ref Range   WBC 10.1 4.0 - 10.5 K/uL   RBC 3.40 (L) 3.87 - 5.11 MIL/uL   Hemoglobin 10.6 (L) 12.0 - 15.0 g/dL   HCT 32.6 (L) 36.0 - 46.0 %   MCV 95.9 78.0 - 100.0 fL   MCH 31.2 26.0 - 34.0 pg   MCHC 32.5 30.0 - 36.0 g/dL   RDW 13.0 11.5 - 15.5 %   Platelets 191 150 - 400 K/uL  Phosphorus     Status: Abnormal   Collection Time: 01/03/17  5:41 AM  Result Value Ref Range   Phosphorus 6.3 (H) 2.5 - 4.6 mg/dL    ABGS No results for input(s): PHART, PO2ART, TCO2, HCO3 in the last 72 hours.  Invalid input(s): PCO2 CULTURES Recent Results (from the past 240 hour(s))  Rapid strep screen     Status: None   Collection Time: 12/31/16  7:03 PM  Result Value Ref Range Status   Streptococcus, Group A Screen (Direct) NEGATIVE NEGATIVE Final    Comment: (NOTE) A Rapid Antigen test may result negative if the antigen level in the sample is below the detection level of this test. The FDA has not cleared this test as a stand-alone test therefore the  rapid antigen negative result has reflexed to a Group A Strep culture.   Culture, group A strep     Status: None   Collection Time: 12/31/16  7:03 PM  Result Value Ref Range Status   Specimen Description THROAT  Final   Special Requests NONE Reflexed from [id number]  Final   Culture   Final    NO GROUP A STREP (S.PYOGENES) ISOLATED Performed at [facility]    Report Status 01/03/2017 FINAL  Final  Blood Culture (routine x 2)     Status: None (Preliminary result)   Collection Time: 12/31/16  7:08 PM  Result Value Ref Range Status   Specimen Description RIGHT ANTECUBITAL  Final   Special Requests BOTTLES DRAWN AEROBIC ONLY 6CC ONLY  Final   Culture NO GROWTH 3 DAYS  Final   Report Status PENDING  Incomplete  Blood Culture (routine x 2)     Status: None (Preliminary result)   Collection Time: 12/31/16  7:14 PM  Result  Value Ref Range Status   Specimen Description RIGHT ANTECUBITAL  Final   Special Requests BOTTLES DRAWN AEROBIC AND ANAEROBIC 6CC EACH  Final   Culture NO GROWTH 3 DAYS  Final   Report Status PENDING  Incomplete  MRSA PCR Screening     Status: None   Collection Time: 12/31/16 10:57 PM  Result Value Ref Range Status   MRSA by PCR NEGATIVE NEGATIVE Final    Comment:        The GeneXpert MRSA Assay (FDA approved for NASAL specimens only), is one component of a comprehensive MRSA colonization surveillance program. It is not intended to diagnose MRSA infection nor to guide or monitor treatment for MRSA infections.    Studies/Results: No results found.  Medications:  Prior to Admission:  Prescriptions Prior to Admission  Medication Sig Dispense Refill Last Dose  . acetaminophen (TYLENOL) 325 MG tablet Take 650 mg by mouth every 6 (six) hours as needed for moderate pain or fever.    12/31/2016 at Unknown time  . albuterol (PROVENTIL) (2.5 MG/3ML) 0.083% nebulizer solution Take 2.5 mg by nebulization every 6 (six) hours as needed for wheezing or shortness of breath.   Past Month at Unknown time  . ALPRAZolam (XANAX) 0.5 MG tablet Take 0.5 mg by mouth 3 (three) times daily as needed for anxiety or sleep.    12/28/2016 at Unknown time  . amLODipine (NORVASC) 10 MG tablet Take 0.5 tablets (5 mg total) by mouth daily. 15 tablet 12 12/30/2016 at Unknown time  . cinacalcet (SENSIPAR) 30 MG tablet Take 30 mg by mouth daily.   12/30/2016 at Unknown time  . diphenhydrAMINE (BENADRYL) 25 mg capsule Take 25 mg by mouth every 6 (six) hours as needed for allergies.   unknown  . metoprolol (LOPRESSOR) 50 MG tablet Take 0.5 tablets (25 mg total) by mouth 2 (two) times daily. 30 tablet 12 12/30/2016 at 1800p  . multivitamin (RENA-VIT) TABS tablet Take 1 tablet by mouth daily.   12/30/2016 at Unknown time  . Oxycodone HCl 10 MG TABS Take 1 tablet (10 mg total) by mouth 3 (three) times daily as needed (pain).  (Patient taking differently: Take 10 mg by mouth 4 (four) times daily as needed (pain). ) 15 tablet 0 12/29/2016 at Unknown time  . sevelamer carbonate (RENVELA) 800 MG tablet Take 1,600-3,200 mg by mouth See admin instructions. Take 3200 mg by mouth 3 times daily with meals and take 1600 mg by mouth with snacks.  12/30/2016 at Unknown time   Scheduled: . cinacalcet  30 mg Oral Q breakfast  . doxycycline (VIBRAMYCIN) IV  100 mg Intravenous Q12H  . epoetin (EPOGEN/PROCRIT) injection  2,000 Units Subcutaneous Q M,W,F-HD  . guaiFENesin  1,200 mg Oral BID  . heparin  5,000 Units Subcutaneous Q8H  . ipratropium-albuterol  3 mL Nebulization Q4H  . methylPREDNISolone (SOLU-MEDROL) injection  40 mg Intravenous Q12H  . multivitamin  1 tablet Oral Daily  . piperacillin-tazobactam (ZOSYN)  IV  3.375 g Intravenous Q12H  . sevelamer carbonate  1,600 mg Oral With snacks  . sevelamer carbonate  3,200 mg Oral TID WC   Continuous: . sodium chloride 1,000 mL (01/03/17 1822)   OIN:OMVEHM chloride, sodium chloride, acetaminophen **OR** acetaminophen, acetaminophen, albuterol, ALPRAZolam, heparin, HYDROcodone-homatropine, lidocaine (PF), lidocaine-prilocaine, ondansetron, oxyCODONE, pentafluoroprop-tetrafluoroeth, sodium chloride  Assesment: She was admitted with healthcare associated pneumonia. She is slowly improving. She's not ready for discharge yet. She is an end-stage renal disease on dialysis which is ongoing. She has diabetes. She's had some elevated blood sugars. Active Problems:   Diabetes mellitus ([place])   Morbid obesity (HCC)   HTN (hypertension)   HLD (hyperlipidemia)   Chronic diastolic CHF (congestive heart failure) (HCC)   Chronic respiratory failure with hypoxia (HCC)   ESRD on dialysis ([place])   Anemia of chronic disease   Sepsis ([place])   HCAP (healthcare-associated pneumonia)    Plan: Continue current treatments. Potential discharge tomorrow    LOS: 4 days   [name]  L 01/04/2017, 8:26 AM

## 2017-01-04 NOTE — Progress Notes (Addendum)
Subjective: Interval History: has no complaint of nausea or vomiting. Her cough is much better. Presently she denies any difficulty breathing..  Objective: Vital signs in last 24 hours: Temp:  [98 F (36.7 C)-98.3 F (36.8 C)] 98.3 F (36.8 C) (01/16 0500) Pulse Rate:  [66-84] 72 (01/16 0500) Resp:  [18-20] 18 (01/16 0500) BP: (111-149)/(56-92) 124/69 (01/16 0500) SpO2:  [95 %-99 %] 96 % (01/16 0500) Weight:  [139.5 kg (307 lb 8.7 oz)] 139.5 kg (307 lb 8.7 oz) (01/15 1250) Weight change: 0.2 kg (7.1 oz)  Intake/Output from previous day: 01/15 0701 - 01/16 0700 In: 1742.2 [P.O.:240; I.V.:422.2; IV Piggyback:1080] Out: -  Intake/Output this shift: No intake/output data recorded.  General appearance: alert, cooperative and no distress Resp: diminished breath sounds bilaterally and posterior - bilateral and wheezes bilaterally Cardio: regular rate and rhythm GI: Obese, nontender and positive bowel sound Extremities: venous stasis dermatitis noted and Patient with chronic venous stasis changes.  Lab Results:  Recent Labs  01/03/17 0541  WBC 10.1  HGB 10.6*  HCT 32.6*  PLT 191   BMET:  Recent Labs  01/03/17 0541  NA 127*  K 4.7  CL 90*  CO2 22  GLUCOSE 333*  BUN 67*  CREATININE 6.79*  CALCIUM 8.0*   No results for input(s): PTH in the last 72 hours. Iron Studies: No results for input(s): IRON, TIBC, TRANSFERRIN, FERRITIN in the last 72 hours.  Studies/Results: No results found.  I have reviewed the patient's current medications.  Assessment/Plan: Problem #1 pneumonia: Presently cough is better. Patient is afebrile and her white blood cell count is normal. Problem #2 end-stage renal disease: She is status post hemodialysis yesterday. Her potassium was normal. Patient presently does not have any uremic  signs and  symptoms. Problem #3 anemia: Her hemoglobin is lower target goal Problem #4 fluid management: Patient intake between dialysis days is high most of  the time. When able to remove about 3- L yesterday. Patient denies any difficulty breathing.  Problem #5 metabolic bone disease: Her calcium is range but phosphorus is high. Presently patient is on a binder Problem #6 hypertension: Her blood pressure is reasonably controlled Problem #7 morbid obesity: Patient has lost  significant weight since she was started on dialysis. Plan: We'll make arrangements for patient to get dialysis tomorrow We'll dialyze her for 41/2 hours  and remove about 3-1/2 L blood pressure tolerates.      LOS: 4 days   Tanith Dagostino S 01/04/2017,8:00 AM

## 2017-01-05 LAB — GLUCOSE, CAPILLARY
Glucose-Capillary: 235 mg/dL — ABNORMAL HIGH (ref 65–99)
Glucose-Capillary: 140 mg/dL — ABNORMAL HIGH (ref 65–99)
Glucose-Capillary: 223 mg/dL — ABNORMAL HIGH (ref 65–99)

## 2017-01-05 LAB — CULTURE, BLOOD (ROUTINE X 2)
CULTURE: NO GROWTH
Culture: NO GROWTH

## 2017-01-05 LAB — CBC WITH DIFFERENTIAL/PLATELET
Basophils Absolute: 0 10*3/uL (ref 0.0–0.1)
Basophils Relative: 0 %
Eosinophils Absolute: 0 10*3/uL (ref 0.0–0.7)
Eosinophils Relative: 0 %
HCT: 32.5 % — ABNORMAL LOW (ref 36.0–46.0)
Hemoglobin: 10.8 g/dL — ABNORMAL LOW (ref 12.0–15.0)
Lymphocytes Relative: 13 %
Lymphs Abs: 1.4 10*3/uL (ref 0.7–4.0)
MCH: 31.1 pg (ref 26.0–34.0)
MCHC: 33.2 g/dL (ref 30.0–36.0)
MCV: 93.7 fL (ref 78.0–100.0)
Monocytes Absolute: 0.7 10*3/uL (ref 0.1–1.0)
Monocytes Relative: 6 %
Neutro Abs: 8.8 10*3/uL — ABNORMAL HIGH (ref 1.7–7.7)
Neutrophils Relative %: 81 %
Platelets: 238 10*3/uL (ref 150–400)
RBC: 3.47 MIL/uL — ABNORMAL LOW (ref 3.87–5.11)
RDW: 13 % (ref 11.5–15.5)
WBC: 10.9 10*3/uL — ABNORMAL HIGH (ref 4.0–10.5)

## 2017-01-05 LAB — RENAL FUNCTION PANEL
Albumin: 3.3 g/dL — ABNORMAL LOW (ref 3.5–5.0)
Anion gap: 14 (ref 5–15)
BUN: 74 mg/dL — AB (ref 6–20)
CO2: 19 mmol/L — AB (ref 22–32)
Calcium: 8 mg/dL — ABNORMAL LOW (ref 8.9–10.3)
Chloride: 96 mmol/L — ABNORMAL LOW (ref 101–111)
Creatinine, Ser: 5.5 mg/dL — ABNORMAL HIGH (ref 0.44–1.00)
GFR calc Af Amer: 10 mL/min — ABNORMAL LOW (ref >60)
GFR calc non Af Amer: 8 mL/min — ABNORMAL LOW (ref >60)
GLUCOSE: 228 mg/dL — AB (ref 65–99)
PHOSPHORUS: 5.1 mg/dL — AB (ref 2.5–4.6)
Potassium: 5.1 mmol/L (ref 3.5–5.1)
Sodium: 129 mmol/L — ABNORMAL LOW (ref 135–145)

## 2017-01-05 LAB — HEMOGLOBIN A1C
HEMOGLOBIN A1C: 8.1 % — AB (ref 4.8–5.6)
Mean Plasma Glucose: 186 mg/dL

## 2017-01-05 MED ORDER — SEVELAMER CARBONATE 800 MG PO TABS
ORAL_TABLET | ORAL | Status: AC
  Filled 2017-01-05: qty 2

## 2017-01-05 MED ORDER — EPOETIN ALFA 4000 UNIT/ML IJ SOLN
INTRAMUSCULAR | Status: AC
  Filled 2017-01-05: qty 1

## 2017-01-05 MED ORDER — LINACLOTIDE 145 MCG PO CAPS
145.0000 ug | ORAL_CAPSULE | Freq: Every day | ORAL | Status: DC
  Administered 2017-01-06: 145 ug via ORAL
  Filled 2017-01-05: qty 1

## 2017-01-05 MED ORDER — INSULIN GLARGINE 100 UNIT/ML ~~LOC~~ SOLN
10.0000 [IU] | Freq: Every day | SUBCUTANEOUS | Status: DC
  Administered 2017-01-05 – 2017-01-06 (×2): 10 [IU] via SUBCUTANEOUS
  Filled 2017-01-05 (×3): qty 0.1

## 2017-01-05 MED ORDER — POTASSIUM CHLORIDE 20 MEQ PO PACK: 20.0000 meq | PACK | Freq: Every day | ORAL | Status: DC

## 2017-01-05 NOTE — Progress Notes (Signed)
Subjective: She was seen in dialysis. She says she is doing some better now. She is still coughing. She is still short of breath. Her blood sugar has been up. No other new complaints. She is constipated. No nausea vomiting or diarrhea. No abdominal pain or chest pain  Objective: Vital signs in last 24 hours: Temp:  [98.1 F (36.7 C)-98.5 F (36.9 C)] 98.1 F (36.7 C) (01/17 0542) Pulse Rate:  [70-84] 71 (01/17 0542) Resp:  [17-18] 18 (01/17 0542) BP: (127-131)/(63-72) 129/66 (01/17 0542) SpO2:  [94 %-100 %] 98 % (01/17 0800) Weight change:  Last BM Date: 12/29/16  Intake/Output from previous day: 01/16 0701 - 01/17 0700 In: 600 [P.O.:600] Out: -   PHYSICAL EXAM General appearance: alert, cooperative and mild distress Resp: rhonchi bilaterally Cardio: regular rate and rhythm, S1, S2 normal, no murmur, click, rub or gallop GI: soft, non-tender; bowel sounds normal; no masses,  no organomegaly Extremities: Chronic bilateral lymphedema is unchanged Skin warm and dry. Pupils reactive. Mucous membranes are moist  Lab Results:  Results for orders placed or performed during the hospital encounter of 12/31/16 (from the past 48 hour(s))  Hemoglobin A1c     Status: Abnormal   Collection Time: 01/04/17  8:58 AM  Result Value Ref Range   Hgb A1c MFr Bld 8.1 (H) 4.8 - 5.6 %    Comment: (NOTE)         Pre-diabetes: 5.7 - 6.4         Diabetes: >6.4         Glycemic control for adults with diabetes: <7.0    Mean Plasma Glucose 186 mg/dL    Comment: (NOTE) Performed At: [facility] [place], [place] [id number] Lindon Romp MD KZ:[id number]   Glucose, capillary     Status: Abnormal   Collection Time: 01/04/17 10:59 AM  Result Value Ref Range   Glucose-Capillary 280 (H) 65 - 99 mg/dL  Glucose, capillary     Status: Abnormal   Collection Time: 01/04/17  4:02 PM  Result Value Ref Range   Glucose-Capillary 334 (H) 65 - 99 mg/dL   Comment 1 Notify RN    Glucose, capillary     Status: Abnormal   Collection Time: 01/04/17 10:28 PM  Result Value Ref Range   Glucose-Capillary 231 (H) 65 - 99 mg/dL   Comment 1 Notify RN    Comment 2 Document in Chart   Renal function panel     Status: Abnormal   Collection Time: 01/05/17  6:22 AM  Result Value Ref Range   Sodium 129 (L) 135 - 145 mmol/L   Potassium 5.1 3.5 - 5.1 mmol/L   Chloride 96 (L) 101 - 111 mmol/L   CO2 19 (L) 22 - 32 mmol/L   Glucose, Bld 228 (H) 65 - 99 mg/dL   BUN 74 (H) 6 - 20 mg/dL   Creatinine, Ser 5.50 (H) 0.44 - 1.00 mg/dL   Calcium 8.0 (L) 8.9 - 10.3 mg/dL   Phosphorus 5.1 (H) 2.5 - 4.6 mg/dL   Albumin 3.3 (L) 3.5 - 5.0 g/dL   GFR calc non Af Amer 8 (L) >60 mL/min   GFR calc Af Amer 10 (L) >60 mL/min    Comment: (NOTE) The eGFR has been calculated using the CKD EPI equation. This calculation has not been validated in all clinical situations. eGFR's persistently <60 mL/min signify possible Chronic Kidney Disease.    Anion gap 14 5 - 15  Glucose, capillary  Status: Abnormal   Collection Time: 01/05/17  7:37 AM  Result Value Ref Range   Glucose-Capillary 235 (H) 65 - 99 mg/dL   Comment 1 Notify RN     ABGS No results for input(s): PHART, PO2ART, TCO2, HCO3 in the last 72 hours.  Invalid input(s): PCO2 CULTURES Recent Results (from the past 240 hour(s))  Rapid strep screen     Status: None   Collection Time: 12/31/16  7:03 PM  Result Value Ref Range Status   Streptococcus, Group A Screen (Direct) NEGATIVE NEGATIVE Final    Comment: (NOTE) A Rapid Antigen test may result negative if the antigen level in the sample is below the detection level of this test. The FDA has not cleared this test as a stand-alone test therefore the rapid antigen negative result has reflexed to a Group A Strep culture.   Culture, group A strep     Status: None   Collection Time: 12/31/16  7:03 PM  Result Value Ref Range Status   Specimen Description THROAT  Final   Special  Requests NONE Reflexed from [id number]  Final   Culture   Final    NO GROUP A STREP (S.PYOGENES) ISOLATED Performed at [facility]    Report Status 01/03/2017 FINAL  Final  Blood Culture (routine x 2)     Status: None (Preliminary result)   Collection Time: 12/31/16  7:08 PM  Result Value Ref Range Status   Specimen Description RIGHT ANTECUBITAL  Final   Special Requests BOTTLES DRAWN AEROBIC ONLY 6CC ONLY  Final   Culture NO GROWTH 4 DAYS  Final   Report Status PENDING  Incomplete  Blood Culture (routine x 2)     Status: None (Preliminary result)   Collection Time: 12/31/16  7:14 PM  Result Value Ref Range Status   Specimen Description RIGHT ANTECUBITAL  Final   Special Requests BOTTLES DRAWN AEROBIC AND ANAEROBIC 6CC EACH  Final   Culture NO GROWTH 4 DAYS  Final   Report Status PENDING  Incomplete  MRSA PCR Screening     Status: None   Collection Time: 12/31/16 10:57 PM  Result Value Ref Range Status   MRSA by PCR NEGATIVE NEGATIVE Final    Comment:        The GeneXpert MRSA Assay (FDA approved for NASAL specimens only), is one component of a comprehensive MRSA colonization surveillance program. It is not intended to diagnose MRSA infection nor to guide or monitor treatment for MRSA infections.    Studies/Results: No results found.  Medications:  Prior to Admission:  Prescriptions Prior to Admission  Medication Sig Dispense Refill Last Dose  . acetaminophen (TYLENOL) 325 MG tablet Take 650 mg by mouth every 6 (six) hours as needed for moderate pain or fever.    12/31/2016 at Unknown time  . albuterol (PROVENTIL) (2.5 MG/3ML) 0.083% nebulizer solution Take 2.5 mg by nebulization every 6 (six) hours as needed for wheezing or shortness of breath.   Past Month at Unknown time  . ALPRAZolam (XANAX) 0.5 MG tablet Take 0.5 mg by mouth 3 (three) times daily as needed for anxiety or sleep.    12/28/2016 at Unknown time  . amLODipine (NORVASC) 10 MG tablet Take 0.5 tablets (5  mg total) by mouth daily. 15 tablet 12 12/30/2016 at Unknown time  . cinacalcet (SENSIPAR) 30 MG tablet Take 30 mg by mouth daily.   12/30/2016 at Unknown time  . diphenhydrAMINE (BENADRYL) 25 mg capsule Take 25 mg by mouth every  6 (six) hours as needed for allergies.   unknown  . metoprolol (LOPRESSOR) 50 MG tablet Take 0.5 tablets (25 mg total) by mouth 2 (two) times daily. 30 tablet 12 12/30/2016 at 1800p  . multivitamin (RENA-VIT) TABS tablet Take 1 tablet by mouth daily.   12/30/2016 at Unknown time  . Oxycodone HCl 10 MG TABS Take 1 tablet (10 mg total) by mouth 3 (three) times daily as needed (pain). (Patient taking differently: Take 10 mg by mouth 4 (four) times daily as needed (pain). ) 15 tablet 0 12/29/2016 at Unknown time  . sevelamer carbonate (RENVELA) 800 MG tablet Take 1,600-3,200 mg by mouth See admin instructions. Take 3200 mg by mouth 3 times daily with meals and take 1600 mg by mouth with snacks.   12/30/2016 at Unknown time   Scheduled: . cinacalcet  30 mg Oral Q breakfast  . doxycycline (VIBRAMYCIN) IV  100 mg Intravenous Q12H  . epoetin (EPOGEN/PROCRIT) injection  2,000 Units Subcutaneous Q M,W,F-HD  . guaiFENesin  1,200 mg Oral BID  . heparin  5,000 Units Subcutaneous Q8H  . insulin aspart  0-20 Units Subcutaneous TID WC  . insulin aspart  0-5 Units Subcutaneous QHS  . ipratropium-albuterol  3 mL Nebulization Q4H  . methylPREDNISolone (SOLU-MEDROL) injection  40 mg Intravenous Q12H  . multivitamin  1 tablet Oral Daily  . piperacillin-tazobactam (ZOSYN)  IV  3.375 g Intravenous Q12H  . sevelamer carbonate  1,600 mg Oral With snacks  . sevelamer carbonate  3,200 mg Oral TID WC   Continuous: . sodium chloride 1,000 mL (01/03/17 1822)   TEL:MRAJHH chloride, sodium chloride, acetaminophen **OR** acetaminophen, acetaminophen, albuterol, ALPRAZolam, heparin, HYDROcodone-homatropine, lidocaine (PF), lidocaine-prilocaine, ondansetron, oxyCODONE, pentafluoroprop-tetrafluoroeth,  sodium chloride  Assesment: She was admitted with healthcare associated pneumonia and acute on chronic hypoxic respiratory failure. She has been septic but that's improved. She has end-stage renal disease on dialysis and is in dialysis now. She has diabetes and is on sliding scale but is probably going to need some Lantus also. This is likely because of her steroids. She is significantly better now. She needs something for constipation. Active Problems:   Diabetes mellitus ([place])   Morbid obesity (HCC)   HTN (hypertension)   HLD (hyperlipidemia)   Chronic diastolic CHF (congestive heart failure) (HCC)   Chronic respiratory failure with hypoxia (HCC)   ESRD on dialysis ([place])   Anemia of chronic disease   Sepsis ([place])   HCAP (healthcare-associated pneumonia)    Plan: Potential discharge tomorrow. Add Lantus. Treat her constipation.    LOS: 5 days   Eben Choinski L 01/05/2017, 9:51 AM

## 2017-01-05 NOTE — Progress Notes (Signed)
[name]  MRN: [id number]  DOB/AGE: 51/08/1966 51 y.o.  Primary Care Physician:HAWKINS,EDWARD L, MD  Admit date: 12/31/2016  Chief Complaint:  Chief Complaint  Patient presents with  . Shortness of Breath    S-Pt presented on  12/31/2016 with  Chief Complaint  Patient presents with  . Shortness of Breath  .    Pt offers no new concerns.    Pt says " I still have cough though better"    meds . cinacalcet  30 mg Oral Q breakfast  . doxycycline (VIBRAMYCIN) IV  100 mg Intravenous Q12H  . epoetin (EPOGEN/PROCRIT) injection  2,000 Units Subcutaneous Q M,W,F-HD  . guaiFENesin  1,200 mg Oral BID  . heparin  5,000 Units Subcutaneous Q8H  . insulin aspart  0-20 Units Subcutaneous TID WC  . insulin aspart  0-5 Units Subcutaneous QHS  . insulin glargine  10 Units Subcutaneous Daily  . ipratropium-albuterol  3 mL Nebulization Q4H  . linaclotide  145 mcg Oral QAC breakfast  . methylPREDNISolone (SOLU-MEDROL) injection  40 mg Intravenous Q12H  . multivitamin  1 tablet Oral Daily  . piperacillin-tazobactam (ZOSYN)  IV  3.375 g Intravenous Q12H  . sevelamer carbonate  1,600 mg Oral With snacks  . sevelamer carbonate  3,200 mg Oral TID WC     Physical Exam: Vital signs in last 24 hours: Temp:  [98.1 F (36.7 C)-98.5 F (36.9 C)] 98.4 F (36.9 C) (01/17 0850) Pulse Rate:  [70-84] 78 (01/17 1030) Resp:  [17-20] 20 (01/17 0850) BP: (127-163)/(63-87) 133/76 (01/17 1030) SpO2:  [94 %-100 %] 98 % (01/17 0850) Weight:  [307 lb 8.7 oz (139.5 kg)] 307 lb 8.7 oz (139.5 kg) (01/17 0850) Weight change:  Last BM Date: 12/29/16  Intake/Output from previous day: 01/16 0701 - 01/17 0700 In: 600 [P.O.:600] Out: -  Total I/O In: 120 [P.O.:120] Out: -    Physical Exam: General- pt is awake,alert, following commands. Resp- No acute REsp distress, ronchi present CVS- S1S2 regular in rate and rhythm GIT- BS+, soft, NT, Morbidly obese EXT- Chronic  LE lympheedema, NO Cyanosis,  chronic venous stasis changes Access- AVF  Lab Results: CBC    Component Value Date/Time   WBC 10.1 01/03/2017 0541   RBC 3.40 (L) 01/03/2017 0541   HGB 10.6 (L) 01/03/2017 0541   HCT 32.6 (L) 01/03/2017 0541   PLT 191 01/03/2017 0541   MCV 95.9 01/03/2017 0541   MCH 31.2 01/03/2017 0541   MCHC 32.5 01/03/2017 0541   RDW 13.0 01/03/2017 0541   LYMPHSABS 1.5 12/31/2016 1908   MONOABS 0.4 12/31/2016 1908   EOSABS 0.2 12/31/2016 1908   BASOSABS 0.0 12/31/2016 1908       BMET  Recent Labs  01/03/17 0541 01/05/17 0622  NA 127* 129*  K 4.7 5.1  CL 90* 96*  CO2 22 19*  GLUCOSE 333* 228*  BUN 67* 74*  CREATININE 6.79* 5.50*  CALCIUM 8.0* 8.0*     MICRO Recent Results (from the past 240 hour(s))  Rapid strep screen     Status: None   Collection Time: 12/31/16  7:03 PM  Result Value Ref Range Status   Streptococcus, Group A Screen (Direct) NEGATIVE NEGATIVE Final    Comment: (NOTE) A Rapid Antigen test may result negative if the antigen level in the sample is below the detection level of this test. The FDA has not cleared this test as a stand-alone test therefore the rapid antigen negative result has reflexed to a Group  A Strep culture.   Culture, group A strep     Status: None   Collection Time: 12/31/16  7:03 PM  Result Value Ref Range Status   Specimen Description THROAT  Final   Special Requests NONE Reflexed from [id number]  Final   Culture   Final    NO GROUP A STREP (S.PYOGENES) ISOLATED Performed at [facility]    Report Status 01/03/2017 FINAL  Final  Blood Culture (routine x 2)     Status: None (Preliminary result)   Collection Time: 12/31/16  7:08 PM  Result Value Ref Range Status   Specimen Description RIGHT ANTECUBITAL  Final   Special Requests BOTTLES DRAWN AEROBIC ONLY 6CC ONLY  Final   Culture NO GROWTH 4 DAYS  Final   Report Status PENDING  Incomplete  Blood Culture (routine x 2)     Status: None (Preliminary result)   Collection  Time: 12/31/16  7:14 PM  Result Value Ref Range Status   Specimen Description RIGHT ANTECUBITAL  Final   Special Requests BOTTLES DRAWN AEROBIC AND ANAEROBIC 6CC EACH  Final   Culture NO GROWTH 4 DAYS  Final   Report Status PENDING  Incomplete  MRSA PCR Screening     Status: None   Collection Time: 12/31/16 10:57 PM  Result Value Ref Range Status   MRSA by PCR NEGATIVE NEGATIVE Final    Comment:        The GeneXpert MRSA Assay (FDA approved for NASAL specimens only), is one component of a comprehensive MRSA colonization surveillance program. It is not intended to diagnose MRSA infection nor to guide or monitor treatment for MRSA infections.       Lab Results  Component Value Date   PTH 127 (H) 11/29/2015   CALCIUM 8.0 (L) 01/05/2017   CAION 1.22 11/25/2016   PHOS 5.1 (H) 01/05/2017       Impression: 1)Renal  ESRD on HD                Pt is on Mon/Wed/Friday schedule                will dialyze today  2)HTN BP at goal  3)Anemia IN ESRD the target HGb goal  Is 9--11. hgb at goal Will keep on epo  4)CKD Mineral-Bone Disorder Secondary Hyperparathyroidism  present      On sensipar Phosphorus not at goal.      On binders  5)ID-admitted with Pneumonia  PMD following  6)Electrolytes  Normokalemic  Hyponatremic Sec to ESRD  7)Acid base Co2 at goal   8) Morbid obesity-Pt is actively trying to loose weight.      Encouraged pt to continue to do the same.   Plan:  Will dialyze today  Addendum Pt seen on HD Pt tolerating tx well.   Karstyn Birkey S 01/05/2017, 10:47 AM

## 2017-01-05 NOTE — Progress Notes (Signed)
Inpatient Diabetes Program Recommendations  AACE/ADA: New Consensus Statement on Inpatient Glycemic Control (2015)  Target Ranges:  Prepandial:   less than 140 mg/dL      Peak postprandial:   less than 180 mg/dL (1-2 hours)      Critically ill patients:  140 - 180 mg/dL   Results for [name], [name] (MRN [id number]) as of 01/05/2017 08:38  Ref. Range 01/04/2017 10:59 01/04/2017 16:02 01/04/2017 22:28 01/05/2017 07:37  Glucose-Capillary Latest Ref Range: 65 - 99 mg/dL 280 (H) 334 (H) 231 (H) 235 (H)   Review of Glycemic Control  Diabetes history: DM2 Outpatient Diabetes medications: None Current orders for Inpatient glycemic control: Novolog 0-20 units TID with meals, Novolog 0-5 units QHS  Inpatient Diabetes Program Recommendations:  Insulin - Basal: If steroids are continued as ordered, please consider ordering Lantus 10 units Q24H. Please note if Lantus is ordered as requested, it will need to be adjusted as steroids are tapered. Insulin - Meal Coverage: If steroids are continued as ordered, please consider ordering Novolog 4 units TID with meals for meal coverage if patient eats at least 50% of meal. HgbA1C: A1C 8.1% on 01/04/17 indicating an average glucose of 186 mg/dl over the past 2-3 months. MD may want to consider ordering outpatient DM oral medication at time of discharge.  Thanks, Barnie Alderman, RN, MSN, CDE Diabetes Coordinator Inpatient Diabetes Program [phone] (Team Pager from 8am to 5pm)

## 2017-01-06 LAB — GLUCOSE, CAPILLARY: GLUCOSE-CAPILLARY: 204 mg/dL — AB (ref 65–99)

## 2017-01-06 MED ORDER — INSULIN GLARGINE 100 UNIT/ML SOLOSTAR PEN: 10.0000 [IU] | PEN_INJECTOR | Freq: Every day | SUBCUTANEOUS | 11 refills | Status: DC

## 2017-01-06 MED ORDER — DOXYCYCLINE HYCLATE 100 MG PO TABS: 100.0000 mg | ORAL_TABLET | Freq: Two times a day (BID) | ORAL | Status: DC

## 2017-01-06 MED ORDER — PREDNISONE 10 MG (21) PO TBPK: ORAL_TABLET | ORAL | 0 refills | Status: DC

## 2017-01-06 MED ORDER — DOXYCYCLINE HYCLATE 50 MG PO CAPS: 100.0000 mg | ORAL_CAPSULE | Freq: Two times a day (BID) | ORAL | 0 refills | Status: DC

## 2017-01-06 MED ORDER — IPRATROPIUM-ALBUTEROL 0.5-2.5 (3) MG/3ML IN SOLN: 3.0000 mL | Freq: Three times a day (TID) | RESPIRATORY_TRACT | Status: DC

## 2017-01-06 NOTE — Discharge Summary (Signed)
Physician Discharge Summary  Patient ID: [name] MRN: [id number] DOB/AGE: 04-02-1966 51 y.o. Primary Care Physician:Madysin Crisp L, MD Admit date: 12/31/2016 Discharge date: 01/06/2017    Discharge Diagnoses:   Active Problems:   Diabetes mellitus ([place])   Morbid obesity (HCC)   HTN (hypertension)   HLD (hyperlipidemia)   Chronic diastolic CHF (congestive heart failure) (HCC)   Chronic respiratory failure with hypoxia (HCC)   ESRD on dialysis ([place])   Anemia of chronic disease   Sepsis ([place])   HCAP (healthcare-associated pneumonia)   Allergies as of 01/06/2017      Reactions   Contrast Media [iodinated Diagnostic Agents] Anaphylaxis, Hives, Swelling, Other (See Comments)   Dye for cardiac cath. Tongue swells   Pneumococcal Vaccines Swelling   Turns skin black, and bodily swelling   Shellfish Allergy Itching, Swelling, Other (See Comments)   Facial swelling - Pt able to eat now 11/25/16   Vancomycin Nausea And Vomiting, Other (See Comments)   Infusion "made me feel like I was dying" had to be readmitted to hospital      Medication List    TAKE these medications   acetaminophen 325 MG tablet Commonly known as:  TYLENOL Take 650 mg by mouth every 6 (six) hours as needed for moderate pain or fever.   albuterol (2.5 MG/3ML) 0.083% nebulizer solution Commonly known as:  PROVENTIL Take 2.5 mg by nebulization every 6 (six) hours as needed for wheezing or shortness of breath.   ALPRAZolam 0.5 MG tablet Commonly known as:  XANAX Take 0.5 mg by mouth 3 (three) times daily as needed for anxiety or sleep.   amLODipine 10 MG tablet Commonly known as:  NORVASC Take 0.5 tablets (5 mg total) by mouth daily.   cinacalcet 30 MG tablet Commonly known as:  SENSIPAR Take 30 mg by mouth daily.   diphenhydrAMINE 25 mg capsule Commonly known as:  BENADRYL Take 25 mg by mouth every 6 (six) hours as needed for allergies.   doxycycline 50 MG capsule Commonly known as:   VIBRAMYCIN Take 2 capsules (100 mg total) by mouth 2 (two) times daily.   Insulin Glargine 100 UNIT/ML Solostar Pen Commonly known as:  LANTUS SOLOSTAR Inject 10 Units into the skin daily at 10 pm.   metoprolol 50 MG tablet Commonly known as:  LOPRESSOR Take 0.5 tablets (25 mg total) by mouth 2 (two) times daily.   multivitamin Tabs tablet Take 1 tablet by mouth daily.   Oxycodone HCl 10 MG Tabs Take 1 tablet (10 mg total) by mouth 3 (three) times daily as needed (pain). What changed:  when to take this   predniSONE 10 MG (21) Tbpk tablet Commonly known as:  STERAPRED UNI-PAK 21 TAB Take by package instructions   sevelamer carbonate 800 MG tablet Commonly known as:  RENVELA Take 1,600-3,200 mg by mouth See admin instructions. Take 3200 mg by mouth 3 times daily with meals and take 1600 mg by mouth with snacks.       Discharged Condition:Improved    Consults: Nephrology  Significant Diagnostic Studies: Dg Chest 2 View  Result Date: 12/31/2016 CLINICAL DATA:  Code sepsis. Acute onset of cough, fever and shortness of breath. Initial encounter. EXAM: CHEST  2 VIEW COMPARISON:  Chest radiograph performed 08/11/2016 FINDINGS: The lungs are well-aerated. Vascular congestion is noted. Increased interstitial markings raise concern for pulmonary edema, though pneumonia could have a similar appearance. No pleural effusion or pneumothorax is seen. The heart is borderline enlarged. No acute osseous abnormalities  are seen. IMPRESSION: Vascular congestion and borderline cardiomegaly. Increased interstitial markings raise concern for pulmonary edema, though pneumonia could have a similar appearance. Electronically Signed   By: Garald Balding M.D.   On: 12/31/2016 19:51    Lab Results: Basic Metabolic Panel:  Recent Labs  01/05/17 0622  NA 129*  K 5.1  CL 96*  CO2 19*  GLUCOSE 228*  BUN 74*  CREATININE 5.50*  CALCIUM 8.0*  PHOS 5.1*   Liver Function Tests:  Recent Labs   01/05/17 0622  ALBUMIN 3.3*     CBC:  Recent Labs  01/05/17 1048  WBC 10.9*  NEUTROABS 8.8*  HGB 10.8*  HCT 32.5*  MCV 93.7  PLT 238    Recent Results (from the past 240 hour(s))  Rapid strep screen     Status: None   Collection Time: 12/31/16  7:03 PM  Result Value Ref Range Status   Streptococcus, Group A Screen (Direct) NEGATIVE NEGATIVE Final    Comment: (NOTE) A Rapid Antigen test may result negative if the antigen level in the sample is below the detection level of this test. The FDA has not cleared this test as a stand-alone test therefore the rapid antigen negative result has reflexed to a Group A Strep culture.   Culture, group A strep     Status: None   Collection Time: 12/31/16  7:03 PM  Result Value Ref Range Status   Specimen Description THROAT  Final   Special Requests NONE Reflexed from [id number]  Final   Culture   Final    NO GROUP A STREP (S.PYOGENES) ISOLATED Performed at [facility]    Report Status 01/03/2017 FINAL  Final  Blood Culture (routine x 2)     Status: None   Collection Time: 12/31/16  7:08 PM  Result Value Ref Range Status   Specimen Description RIGHT ANTECUBITAL  Final   Special Requests BOTTLES DRAWN AEROBIC ONLY 6CC ONLY  Final   Culture NO GROWTH 5 DAYS  Final   Report Status 01/05/2017 FINAL  Final  Blood Culture (routine x 2)     Status: None   Collection Time: 12/31/16  7:14 PM  Result Value Ref Range Status   Specimen Description RIGHT ANTECUBITAL  Final   Special Requests BOTTLES DRAWN AEROBIC AND ANAEROBIC 6CC EACH  Final   Culture NO GROWTH 5 DAYS  Final   Report Status 01/05/2017 FINAL  Final  MRSA PCR Screening     Status: None   Collection Time: 12/31/16 10:57 PM  Result Value Ref Range Status   MRSA by PCR NEGATIVE NEGATIVE Final    Comment:        The GeneXpert MRSA Assay (FDA approved for NASAL specimens only), is one component of a comprehensive MRSA colonization surveillance program. It is  not intended to diagnose MRSA infection nor to guide or monitor treatment for MRSA infections.      Hospital Course: This is a 51 year old who has end-stage renal disease on dialysis. She has morbid obesity diabetes chronic hypoxic respiratory failure and she is in the hospital with pneumonia. She has healthcare associated pneumonia. She was treated with IV antibiotics IV steroids and improved. By the time of discharge she was back at baseline. She is still wearing oxygen. Her blood sugar was more than previously and she's going to have to go home on insulin although she has been able to come off that previously. I think it's because she is sick and because she has  been on steroids.  Discharge Exam: Blood pressure (!) 146/72, pulse 65, temperature 97.5 F (36.4 C), temperature source Oral, resp. rate 18, height _0  (1.651 m), weight (!) 138 kg (304 lb 3.8 oz), SpO2 97 %. She is awake and alert. Chest is clear. She is morbidly obese. She has chronic lymphedema.  Disposition: Home to restart home dialysis tomorrow  Discharge Instructions    Call MD for:  persistant nausea and vomiting    Complete by:  As directed    Call MD for:  temperature >100.4    Complete by:  As directed    Diet - low sodium heart healthy    Complete by:  As directed    Increase activity slowly    Complete by:  As directed       Follow-up [place], MD. Schedule an appointment as soon as possible for a visit in 1 week(s).   Specialty:  Pulmonary Disease Contact information: [address] [place] [address] [phone]           Signed: Sirius Woodford L   01/06/2017, 10:55 AM

## 2017-01-06 NOTE — Progress Notes (Signed)
Rn educated pt on discharge instructions, new medications ordered as well as follow up appts. Pt verbalized understanding and had no questions.

## 2017-01-06 NOTE — Progress Notes (Signed)
Subjective: She feels much better. She wants to go home. Her cough is less.  Objective: Vital signs in last 24 hours: Temp:  [97.5 F (36.4 C)-98.2 F (36.8 C)] 97.5 F (36.4 C) (01/18 0550) Pulse Rate:  [65-80] 65 (01/18 0550) Resp:  [18-20] 18 (01/18 0550) BP: (112-155)/(60-95) 146/72 (01/18 0550) SpO2:  [95 %-100 %] 97 % (01/18 0852) Weight:  [138 kg (304 lb 3.8 oz)] 138 kg (304 lb 3.8 oz) (01/18 0550) Weight change:  Last BM Date: 12/29/16  Intake/Output from previous day: 01/17 0701 - 01/18 0700 In: 170 [P.O.:120; IV Piggyback:50] Out: 4000   PHYSICAL EXAM General appearance: alert, cooperative, no distress and morbidly obese Resp: clear to auscultation bilaterally Cardio: regular rate and rhythm, S1, S2 normal, no murmur, click, rub or gallop GI: soft, non-tender; bowel sounds normal; no masses,  no organomegaly Extremities: Chronic bilateral lymphedema is unchanged Mucous membranes are moist. Pupils are reactive.  Lab Results:  Results for orders placed or performed during the hospital encounter of 12/31/16 (from the past 48 hour(s))  Glucose, capillary     Status: Abnormal   Collection Time: 01/04/17 10:59 AM  Result Value Ref Range   Glucose-Capillary 280 (H) 65 - 99 mg/dL  Glucose, capillary     Status: Abnormal   Collection Time: 01/04/17  4:02 PM  Result Value Ref Range   Glucose-Capillary 334 (H) 65 - 99 mg/dL   Comment 1 Notify RN   Glucose, capillary     Status: Abnormal   Collection Time: 01/04/17 10:28 PM  Result Value Ref Range   Glucose-Capillary 231 (H) 65 - 99 mg/dL   Comment 1 Notify RN    Comment 2 Document in Chart   Renal function panel     Status: Abnormal   Collection Time: 01/05/17  6:22 AM  Result Value Ref Range   Sodium 129 (L) 135 - 145 mmol/L   Potassium 5.1 3.5 - 5.1 mmol/L   Chloride 96 (L) 101 - 111 mmol/L   CO2 19 (L) 22 - 32 mmol/L   Glucose, Bld 228 (H) 65 - 99 mg/dL   BUN 74 (H) 6 - 20 mg/dL   Creatinine, Ser 5.50 (H)  0.44 - 1.00 mg/dL   Calcium 8.0 (L) 8.9 - 10.3 mg/dL   Phosphorus 5.1 (H) 2.5 - 4.6 mg/dL   Albumin 3.3 (L) 3.5 - 5.0 g/dL   GFR calc non Af Amer 8 (L) >60 mL/min   GFR calc Af Amer 10 (L) >60 mL/min    Comment: (NOTE) The eGFR has been calculated using the CKD EPI equation. This calculation has not been validated in all clinical situations. eGFR's persistently <60 mL/min signify possible Chronic Kidney Disease.    Anion gap 14 5 - 15  Glucose, capillary     Status: Abnormal   Collection Time: 01/05/17  7:37 AM  Result Value Ref Range   Glucose-Capillary 235 (H) 65 - 99 mg/dL   Comment 1 Notify RN   CBC with Differential/Platelet     Status: Abnormal   Collection Time: 01/05/17 10:48 AM  Result Value Ref Range   WBC 10.9 (H) 4.0 - 10.5 K/uL   RBC 3.47 (L) 3.87 - 5.11 MIL/uL   Hemoglobin 10.8 (L) 12.0 - 15.0 g/dL   HCT 32.5 (L) 36.0 - 46.0 %   MCV 93.7 78.0 - 100.0 fL   MCH 31.1 26.0 - 34.0 pg   MCHC 33.2 30.0 - 36.0 g/dL   RDW 13.0 11.5 - 15.5 %  Platelets 238 150 - 400 K/uL   Neutrophils Relative % 81 %   Lymphocytes Relative 13 %   Monocytes Relative 6 %   Eosinophils Relative 0 %   Basophils Relative 0 %   Neutro Abs 8.8 (H) 1.7 - 7.7 K/uL   Lymphs Abs 1.4 0.7 - 4.0 K/uL   Monocytes Absolute 0.7 0.1 - 1.0 K/uL   Eosinophils Absolute 0.0 0.0 - 0.7 K/uL   Basophils Absolute 0.0 0.0 - 0.1 K/uL  Glucose, capillary     Status: Abnormal   Collection Time: 01/05/17  4:38 PM  Result Value Ref Range   Glucose-Capillary 140 (H) 65 - 99 mg/dL  Glucose, capillary     Status: Abnormal   Collection Time: 01/05/17  8:48 PM  Result Value Ref Range   Glucose-Capillary 223 (H) 65 - 99 mg/dL   Comment 1 Notify RN    Comment 2 Document in Chart   Glucose, capillary     Status: Abnormal   Collection Time: 01/06/17  8:01 AM  Result Value Ref Range   Glucose-Capillary 204 (H) 65 - 99 mg/dL   Comment 1 Notify RN     ABGS No results for input(s): PHART, PO2ART, TCO2, HCO3 in the  last 72 hours.  Invalid input(s): PCO2 CULTURES Recent Results (from the past 240 hour(s))  Rapid strep screen     Status: None   Collection Time: 12/31/16  7:03 PM  Result Value Ref Range Status   Streptococcus, Group A Screen (Direct) NEGATIVE NEGATIVE Final    Comment: (NOTE) A Rapid Antigen test may result negative if the antigen level in the sample is below the detection level of this test. The FDA has not cleared this test as a stand-alone test therefore the rapid antigen negative result has reflexed to a Group A Strep culture.   Culture, group A strep     Status: None   Collection Time: 12/31/16  7:03 PM  Result Value Ref Range Status   Specimen Description THROAT  Final   Special Requests NONE Reflexed from [id number]  Final   Culture   Final    NO GROUP A STREP (S.PYOGENES) ISOLATED Performed at [facility]    Report Status 01/03/2017 FINAL  Final  Blood Culture (routine x 2)     Status: None   Collection Time: 12/31/16  7:08 PM  Result Value Ref Range Status   Specimen Description RIGHT ANTECUBITAL  Final   Special Requests BOTTLES DRAWN AEROBIC ONLY 6CC ONLY  Final   Culture NO GROWTH 5 DAYS  Final   Report Status 01/05/2017 FINAL  Final  Blood Culture (routine x 2)     Status: None   Collection Time: 12/31/16  7:14 PM  Result Value Ref Range Status   Specimen Description RIGHT ANTECUBITAL  Final   Special Requests BOTTLES DRAWN AEROBIC AND ANAEROBIC 6CC EACH  Final   Culture NO GROWTH 5 DAYS  Final   Report Status 01/05/2017 FINAL  Final  MRSA PCR Screening     Status: None   Collection Time: 12/31/16 10:57 PM  Result Value Ref Range Status   MRSA by PCR NEGATIVE NEGATIVE Final    Comment:        The GeneXpert MRSA Assay (FDA approved for NASAL specimens only), is one component of a comprehensive MRSA colonization surveillance program. It is not intended to diagnose MRSA infection nor to guide or monitor treatment for MRSA infections.     Studies/Results: No results found.  Medications:  Prior to Admission:  Prescriptions Prior to Admission  Medication Sig Dispense Refill Last Dose  . acetaminophen (TYLENOL) 325 MG tablet Take 650 mg by mouth every 6 (six) hours as needed for moderate pain or fever.    12/31/2016 at Unknown time  . albuterol (PROVENTIL) (2.5 MG/3ML) 0.083% nebulizer solution Take 2.5 mg by nebulization every 6 (six) hours as needed for wheezing or shortness of breath.   Past Month at Unknown time  . ALPRAZolam (XANAX) 0.5 MG tablet Take 0.5 mg by mouth 3 (three) times daily as needed for anxiety or sleep.    12/28/2016 at Unknown time  . amLODipine (NORVASC) 10 MG tablet Take 0.5 tablets (5 mg total) by mouth daily. 15 tablet 12 12/30/2016 at Unknown time  . cinacalcet (SENSIPAR) 30 MG tablet Take 30 mg by mouth daily.   12/30/2016 at Unknown time  . diphenhydrAMINE (BENADRYL) 25 mg capsule Take 25 mg by mouth every 6 (six) hours as needed for allergies.   unknown  . metoprolol (LOPRESSOR) 50 MG tablet Take 0.5 tablets (25 mg total) by mouth 2 (two) times daily. 30 tablet 12 12/30/2016 at 1800p  . multivitamin (RENA-VIT) TABS tablet Take 1 tablet by mouth daily.   12/30/2016 at Unknown time  . Oxycodone HCl 10 MG TABS Take 1 tablet (10 mg total) by mouth 3 (three) times daily as needed (pain). (Patient taking differently: Take 10 mg by mouth 4 (four) times daily as needed (pain). ) 15 tablet 0 12/29/2016 at Unknown time  . sevelamer carbonate (RENVELA) 800 MG tablet Take 1,600-3,200 mg by mouth See admin instructions. Take 3200 mg by mouth 3 times daily with meals and take 1600 mg by mouth with snacks.   12/30/2016 at Unknown time   Scheduled: . cinacalcet  30 mg Oral Q breakfast  . doxycycline  100 mg Oral Q12H  . epoetin (EPOGEN/PROCRIT) injection  2,000 Units Subcutaneous Q M,W,F-HD  . guaiFENesin  1,200 mg Oral BID  . heparin  5,000 Units Subcutaneous Q8H  . insulin aspart  0-20 Units Subcutaneous TID WC  .  insulin aspart  0-5 Units Subcutaneous QHS  . insulin glargine  10 Units Subcutaneous Daily  . ipratropium-albuterol  3 mL Nebulization TID  . linaclotide  145 mcg Oral QAC breakfast  . methylPREDNISolone (SOLU-MEDROL) injection  40 mg Intravenous Q12H  . multivitamin  1 tablet Oral Daily  . piperacillin-tazobactam (ZOSYN)  IV  3.375 g Intravenous Q12H  . sevelamer carbonate  1,600 mg Oral With snacks  . sevelamer carbonate  3,200 mg Oral TID WC   Continuous: . sodium chloride 1,000 mL (01/03/17 1822)   GUR:KYHCWC chloride, sodium chloride, acetaminophen **OR** acetaminophen, acetaminophen, albuterol, ALPRAZolam, heparin, HYDROcodone-homatropine, lidocaine (PF), lidocaine-prilocaine, ondansetron, oxyCODONE, pentafluoroprop-tetrafluoroeth, sodium chloride  Assesment: She was admitted with healthcare associated pneumonia and sepsis. She has acute on chronic hypoxic respiratory failure. She has chronic diastolic heart failure but that has not been much of an issue. She is much improved. She has end-stage renal disease on dialysis and will be scheduled for dialysis tomorrow. She is ready for discharge. Active Problems:   Diabetes mellitus ([place])   Morbid obesity ([facility])   HTN (hypertension)   HLD (hyperlipidemia)   Chronic diastolic CHF (congestive heart failure) ([facility])   Chronic respiratory failure with hypoxia ([facility])   ESRD on dialysis ([place])   Anemia of chronic disease   Sepsis ([place])   HCAP (healthcare-associated pneumonia)    Plan: Discharge home today  LOS: 6 days   Leala Bryand L 01/06/2017, 10:53 AM

## 2017-01-06 NOTE — Progress Notes (Signed)
Pharmacy Antibiotic Note  [name] is a 51 y.o. female admitted on 12/31/2016 with sepsis.  Pharmacy has been consulted for Zosyn and doxycycline dosing.  Day#7 abx. Patient is feeling better. Cough is less, nonproductive. Afebrile, WBC stable.   Plan: Contine Zosyn 3.375gm IV x 1 in ED then, 3.375gm IV q12h EID , infused over 4 hours Continue Doxycycline 155m IV q12h (change to PO) F/U cxs and clinical progress  Height: _0  (165.1 cm) Weight: (!) 304 lb 3.8 oz (138 kg) IBW/kg (Calculated) : 57  Temp (24hrs), Avg:97.9 F (36.6 C), Min:97.5 F (36.4 C), Max:98.2 F (36.8 C)   Recent Labs Lab 12/31/16 1908 12/31/16 2012 01/01/17 0647 01/03/17 0541 01/05/17 0622 01/05/17 1048  WBC 9.9  --  9.3 10.1  --  10.9*  CREATININE 3.46*  --  4.52* 6.79* 5.50*  --   LATICACIDVEN  --  0.88  --   --   --   --     Estimated Creatinine Clearance: 17.3 mL/min (by C-G formula based on SCr of 5.5 mg/dL (H)).    Allergies  Allergen Reactions  . Contrast Media [Iodinated Diagnostic Agents] Anaphylaxis, Hives, Swelling and Other (See Comments)    Dye for cardiac cath. Tongue swells  . Pneumococcal Vaccines Swelling    Turns skin black, and bodily swelling  . Shellfish Allergy Itching, Swelling and Other (See Comments)    Facial swelling - Pt able to eat now 11/25/16  . Vancomycin Nausea And Vomiting and Other (See Comments)    Infusion "made me feel like I was dying" had to be readmitted to hospital   Antimicrobials this admission: Zosyn 1/12 >> Doxycycline 1/12 >>   Microbiology results: 1/12 BCx: ngtd 1/12 Influenzae panel is negative 1/12 Grp A Strep: pending   Thank you for allowing pharmacy to be a part of this patient's care.  HPricilla Larsson [facility] 01/06/2017 10:27 AM

## 2017-01-06 NOTE — Progress Notes (Signed)
Subjective: Interval History: Patient claims to be feeling much better. Presently she doesn't have any cough and no sputum production. Denies also any difficulty breathing.  Objective: Vital signs in last 24 hours: Temp:  [97.5 F (36.4 C)-98.2 F (36.8 C)] 97.5 F (36.4 C) (01/18 0550) Pulse Rate:  [65-80] 65 (01/18 0550) Resp:  [18-20] 18 (01/18 0550) BP: (112-155)/(60-95) 146/72 (01/18 0550) SpO2:  [95 %-100 %] 97 % (01/18 0852) Weight:  [138 kg (304 lb 3.8 oz)] 138 kg (304 lb 3.8 oz) (01/18 0550) Weight change:   Intake/Output from previous day: 01/17 0701 - 01/18 0700 In: 170 [P.O.:120; IV Piggyback:50] Out: 4000  Intake/Output this shift: No intake/output data recorded.  General appearance: alert, cooperative and no distress Resp: diminished breath sounds bilaterally and posterior - bilateral and wheezes bilaterally Cardio: regular rate and rhythm GI: Obese, nontender and positive bowel sound Extremities: venous stasis dermatitis noted and Patient with chronic venous stasis changes.  Lab Results:  Recent Labs  01/05/17 1048  WBC 10.9*  HGB 10.8*  HCT 32.5*  PLT 238   BMET:   Recent Labs  01/05/17 0622  NA 129*  K 5.1  CL 96*  CO2 19*  GLUCOSE 228*  BUN 74*  CREATININE 5.50*  CALCIUM 8.0*   No results for input(s): PTH in the last 72 hours. Iron Studies: No results for input(s): IRON, TIBC, TRANSFERRIN, FERRITIN in the last 72 hours.  Studies/Results: No results found.  I have reviewed the patient's current medications.  Assessment/Plan: Problem #1 pneumonia: Presently cough is better. Patient remains on antibiotics. Presently she is afebrile and feeling much better.. Problem #2 end-stage renal disease: She is status post hemodialysis yesterday. Her potassium is  normal. Patient presently does not have any uremic  signs and  symptoms. Problem #3 anemia: Her hemoglobin is lower target goal Problem #4 fluid management: Patient intake between  dialysis days is high most of the time. Patient presently denies any difficulty breathing. We are able to remove about 4 L with dialysis yesterday. Problem #5 metabolic bone disease: Her calcium is normal and phosphorus has come down toward target goal.  Problem #6 hypertension: Her blood pressure is reasonably controlled Problem #7 morbid obesity: Patient has  significant weight since she was started on dialysis. Plan: We'll make arrangements for patient to get dialysis tomorrow We'll dialyze her for 41/2 hours  and remove about 4 L blood pressure tolerates.      LOS: 6 days   Jayln Branscom S 01/06/2017,9:42 AM

## 2017-01-07 DIAGNOSIS — N2581 Secondary hyperparathyroidism of renal origin: Secondary | ICD-10-CM | POA: Diagnosis not present

## 2017-01-07 DIAGNOSIS — Z992 Dependence on renal dialysis: Secondary | ICD-10-CM | POA: Diagnosis not present

## 2017-01-07 DIAGNOSIS — D631 Anemia in chronic kidney disease: Secondary | ICD-10-CM | POA: Diagnosis not present

## 2017-01-07 DIAGNOSIS — R110 Nausea: Secondary | ICD-10-CM | POA: Diagnosis not present

## 2017-01-07 DIAGNOSIS — D509 Iron deficiency anemia, unspecified: Secondary | ICD-10-CM | POA: Diagnosis not present

## 2017-01-07 DIAGNOSIS — N186 End stage renal disease: Secondary | ICD-10-CM | POA: Diagnosis not present

## 2017-01-10 DIAGNOSIS — N2581 Secondary hyperparathyroidism of renal origin: Secondary | ICD-10-CM | POA: Diagnosis not present

## 2017-01-10 DIAGNOSIS — D509 Iron deficiency anemia, unspecified: Secondary | ICD-10-CM | POA: Diagnosis not present

## 2017-01-10 DIAGNOSIS — Z992 Dependence on renal dialysis: Secondary | ICD-10-CM | POA: Diagnosis not present

## 2017-01-10 DIAGNOSIS — D631 Anemia in chronic kidney disease: Secondary | ICD-10-CM | POA: Diagnosis not present

## 2017-01-10 DIAGNOSIS — R110 Nausea: Secondary | ICD-10-CM | POA: Diagnosis not present

## 2017-01-10 DIAGNOSIS — N186 End stage renal disease: Secondary | ICD-10-CM | POA: Diagnosis not present

## 2017-01-12 DIAGNOSIS — N2581 Secondary hyperparathyroidism of renal origin: Secondary | ICD-10-CM | POA: Diagnosis not present

## 2017-01-12 DIAGNOSIS — Z992 Dependence on renal dialysis: Secondary | ICD-10-CM | POA: Diagnosis not present

## 2017-01-12 DIAGNOSIS — D509 Iron deficiency anemia, unspecified: Secondary | ICD-10-CM | POA: Diagnosis not present

## 2017-01-12 DIAGNOSIS — D631 Anemia in chronic kidney disease: Secondary | ICD-10-CM | POA: Diagnosis not present

## 2017-01-12 DIAGNOSIS — N186 End stage renal disease: Secondary | ICD-10-CM | POA: Diagnosis not present

## 2017-01-12 DIAGNOSIS — R110 Nausea: Secondary | ICD-10-CM | POA: Diagnosis not present

## 2017-01-14 DIAGNOSIS — N186 End stage renal disease: Secondary | ICD-10-CM | POA: Diagnosis not present

## 2017-01-14 DIAGNOSIS — R110 Nausea: Secondary | ICD-10-CM | POA: Diagnosis not present

## 2017-01-14 DIAGNOSIS — N2581 Secondary hyperparathyroidism of renal origin: Secondary | ICD-10-CM | POA: Diagnosis not present

## 2017-01-14 DIAGNOSIS — D509 Iron deficiency anemia, unspecified: Secondary | ICD-10-CM | POA: Diagnosis not present

## 2017-01-14 DIAGNOSIS — Z992 Dependence on renal dialysis: Secondary | ICD-10-CM | POA: Diagnosis not present

## 2017-01-14 DIAGNOSIS — D631 Anemia in chronic kidney disease: Secondary | ICD-10-CM | POA: Diagnosis not present

## 2017-01-17 DIAGNOSIS — N186 End stage renal disease: Secondary | ICD-10-CM | POA: Diagnosis not present

## 2017-01-17 DIAGNOSIS — D509 Iron deficiency anemia, unspecified: Secondary | ICD-10-CM | POA: Diagnosis not present

## 2017-01-17 DIAGNOSIS — R110 Nausea: Secondary | ICD-10-CM | POA: Diagnosis not present

## 2017-01-17 DIAGNOSIS — Z992 Dependence on renal dialysis: Secondary | ICD-10-CM | POA: Diagnosis not present

## 2017-01-17 DIAGNOSIS — D631 Anemia in chronic kidney disease: Secondary | ICD-10-CM | POA: Diagnosis not present

## 2017-01-17 DIAGNOSIS — N2581 Secondary hyperparathyroidism of renal origin: Secondary | ICD-10-CM | POA: Diagnosis not present

## 2017-01-18 ENCOUNTER — Encounter (HOSPITAL_COMMUNITY): Payer: Self-pay | Admitting: Emergency Medicine

## 2017-01-18 ENCOUNTER — Inpatient Hospital Stay (HOSPITAL_COMMUNITY)
Admission: EM | Admit: 2017-01-18 | Discharge: 2017-02-01 | DRG: 871 | Disposition: A | Discharge status: Skilled Nursing Facility | Payer: Medicare Other | Attending: Pulmonary Disease | Admitting: Pulmonary Disease

## 2017-01-18 ENCOUNTER — Emergency Department (HOSPITAL_COMMUNITY): Payer: Medicare Other

## 2017-01-18 DIAGNOSIS — Z992 Dependence on renal dialysis: Secondary | ICD-10-CM

## 2017-01-18 DIAGNOSIS — R5081 Fever presenting with conditions classified elsewhere: Secondary | ICD-10-CM | POA: Diagnosis not present

## 2017-01-18 DIAGNOSIS — I132 Hypertensive heart and chronic kidney disease with heart failure and with stage 5 chronic kidney disease, or end stage renal disease: Secondary | ICD-10-CM | POA: Diagnosis present

## 2017-01-18 DIAGNOSIS — L03119 Cellulitis of unspecified part of limb: Secondary | ICD-10-CM | POA: Diagnosis present

## 2017-01-18 DIAGNOSIS — I890 Lymphedema, not elsewhere classified: Secondary | ICD-10-CM | POA: Diagnosis present

## 2017-01-18 DIAGNOSIS — L03116 Cellulitis of left lower limb: Secondary | ICD-10-CM | POA: Diagnosis present

## 2017-01-18 DIAGNOSIS — Z6841 Body Mass Index (BMI) 40.0 and over, adult: Secondary | ICD-10-CM

## 2017-01-18 DIAGNOSIS — J45909 Unspecified asthma, uncomplicated: Secondary | ICD-10-CM | POA: Diagnosis present

## 2017-01-18 DIAGNOSIS — J9611 Chronic respiratory failure with hypoxia: Secondary | ICD-10-CM | POA: Diagnosis present

## 2017-01-18 DIAGNOSIS — N2581 Secondary hyperparathyroidism of renal origin: Secondary | ICD-10-CM | POA: Diagnosis present

## 2017-01-18 DIAGNOSIS — Z8249 Family history of ischemic heart disease and other diseases of the circulatory system: Secondary | ICD-10-CM

## 2017-01-18 DIAGNOSIS — D631 Anemia in chronic kidney disease: Secondary | ICD-10-CM | POA: Diagnosis not present

## 2017-01-18 DIAGNOSIS — R7989 Other specified abnormal findings of blood chemistry: Secondary | ICD-10-CM | POA: Diagnosis present

## 2017-01-18 DIAGNOSIS — M79604 Pain in right leg: Secondary | ICD-10-CM | POA: Diagnosis not present

## 2017-01-18 DIAGNOSIS — K7200 Acute and subacute hepatic failure without coma: Secondary | ICD-10-CM | POA: Diagnosis present

## 2017-01-18 DIAGNOSIS — I2510 Atherosclerotic heart disease of native coronary artery without angina pectoris: Secondary | ICD-10-CM | POA: Diagnosis present

## 2017-01-18 DIAGNOSIS — M6281 Muscle weakness (generalized): Secondary | ICD-10-CM | POA: Diagnosis not present

## 2017-01-18 DIAGNOSIS — E871 Hypo-osmolality and hyponatremia: Secondary | ICD-10-CM | POA: Diagnosis present

## 2017-01-18 DIAGNOSIS — I50812 Chronic right heart failure: Secondary | ICD-10-CM | POA: Diagnosis present

## 2017-01-18 DIAGNOSIS — Z452 Encounter for adjustment and management of vascular access device: Secondary | ICD-10-CM | POA: Diagnosis not present

## 2017-01-18 DIAGNOSIS — Z881 Allergy status to other antibiotic agents status: Secondary | ICD-10-CM

## 2017-01-18 DIAGNOSIS — L0390 Cellulitis, unspecified: Secondary | ICD-10-CM | POA: Diagnosis not present

## 2017-01-18 DIAGNOSIS — I959 Hypotension, unspecified: Secondary | ICD-10-CM | POA: Diagnosis present

## 2017-01-18 DIAGNOSIS — R112 Nausea with vomiting, unspecified: Secondary | ICD-10-CM

## 2017-01-18 DIAGNOSIS — A419 Sepsis, unspecified organism: Secondary | ICD-10-CM | POA: Diagnosis present

## 2017-01-18 DIAGNOSIS — G9341 Metabolic encephalopathy: Secondary | ICD-10-CM | POA: Diagnosis present

## 2017-01-18 DIAGNOSIS — D696 Thrombocytopenia, unspecified: Secondary | ICD-10-CM | POA: Diagnosis present

## 2017-01-18 DIAGNOSIS — N184 Chronic kidney disease, stage 4 (severe): Secondary | ICD-10-CM

## 2017-01-18 DIAGNOSIS — M899 Disorder of bone, unspecified: Secondary | ICD-10-CM | POA: Diagnosis present

## 2017-01-18 DIAGNOSIS — B9689 Other specified bacterial agents as the cause of diseases classified elsewhere: Secondary | ICD-10-CM | POA: Diagnosis present

## 2017-01-18 DIAGNOSIS — Z91041 Radiographic dye allergy status: Secondary | ICD-10-CM

## 2017-01-18 DIAGNOSIS — I248 Other forms of acute ischemic heart disease: Secondary | ICD-10-CM | POA: Diagnosis present

## 2017-01-18 DIAGNOSIS — R0989 Other specified symptoms and signs involving the circulatory and respiratory systems: Secondary | ICD-10-CM | POA: Diagnosis not present

## 2017-01-18 DIAGNOSIS — E119 Type 2 diabetes mellitus without complications: Secondary | ICD-10-CM

## 2017-01-18 DIAGNOSIS — E8889 Other specified metabolic disorders: Secondary | ICD-10-CM | POA: Diagnosis present

## 2017-01-18 DIAGNOSIS — E785 Hyperlipidemia, unspecified: Secondary | ICD-10-CM | POA: Diagnosis not present

## 2017-01-18 DIAGNOSIS — I878 Other specified disorders of veins: Secondary | ICD-10-CM | POA: Diagnosis present

## 2017-01-18 DIAGNOSIS — I10 Essential (primary) hypertension: Secondary | ICD-10-CM | POA: Diagnosis not present

## 2017-01-18 DIAGNOSIS — E1122 Type 2 diabetes mellitus with diabetic chronic kidney disease: Secondary | ICD-10-CM | POA: Diagnosis present

## 2017-01-18 DIAGNOSIS — R600 Localized edema: Secondary | ICD-10-CM | POA: Diagnosis not present

## 2017-01-18 DIAGNOSIS — Z887 Allergy status to serum and vaccine status: Secondary | ICD-10-CM

## 2017-01-18 DIAGNOSIS — E662 Morbid (severe) obesity with alveolar hypoventilation: Secondary | ICD-10-CM | POA: Diagnosis present

## 2017-01-18 DIAGNOSIS — I2729 Other secondary pulmonary hypertension: Secondary | ICD-10-CM | POA: Diagnosis present

## 2017-01-18 DIAGNOSIS — R6889 Other general symptoms and signs: Secondary | ICD-10-CM | POA: Diagnosis present

## 2017-01-18 DIAGNOSIS — Z95828 Presence of other vascular implants and grafts: Secondary | ICD-10-CM

## 2017-01-18 DIAGNOSIS — J189 Pneumonia, unspecified organism: Secondary | ICD-10-CM | POA: Diagnosis present

## 2017-01-18 DIAGNOSIS — R778 Other specified abnormalities of plasma proteins: Secondary | ICD-10-CM | POA: Diagnosis present

## 2017-01-18 DIAGNOSIS — M79605 Pain in left leg: Secondary | ICD-10-CM | POA: Diagnosis not present

## 2017-01-18 DIAGNOSIS — R079 Chest pain, unspecified: Secondary | ICD-10-CM | POA: Diagnosis not present

## 2017-01-18 DIAGNOSIS — I5032 Chronic diastolic (congestive) heart failure: Secondary | ICD-10-CM | POA: Diagnosis present

## 2017-01-18 DIAGNOSIS — R0602 Shortness of breath: Secondary | ICD-10-CM | POA: Diagnosis not present

## 2017-01-18 DIAGNOSIS — Z79899 Other long term (current) drug therapy: Secondary | ICD-10-CM

## 2017-01-18 DIAGNOSIS — A4150 Gram-negative sepsis, unspecified: Principal | ICD-10-CM | POA: Diagnosis present

## 2017-01-18 DIAGNOSIS — S46001D Unspecified injury of muscle(s) and tendon(s) of the rotator cuff of right shoulder, subsequent encounter: Secondary | ICD-10-CM | POA: Diagnosis not present

## 2017-01-18 DIAGNOSIS — R6521 Severe sepsis with septic shock: Secondary | ICD-10-CM | POA: Diagnosis present

## 2017-01-18 DIAGNOSIS — R279 Unspecified lack of coordination: Secondary | ICD-10-CM | POA: Diagnosis not present

## 2017-01-18 DIAGNOSIS — Z91013 Allergy to seafood: Secondary | ICD-10-CM

## 2017-01-18 DIAGNOSIS — I2781 Cor pulmonale (chronic): Secondary | ICD-10-CM | POA: Diagnosis present

## 2017-01-18 DIAGNOSIS — R509 Fever, unspecified: Secondary | ICD-10-CM | POA: Diagnosis present

## 2017-01-18 DIAGNOSIS — N186 End stage renal disease: Secondary | ICD-10-CM

## 2017-01-18 DIAGNOSIS — K829 Disease of gallbladder, unspecified: Secondary | ICD-10-CM | POA: Diagnosis not present

## 2017-01-18 DIAGNOSIS — R05 Cough: Secondary | ICD-10-CM | POA: Diagnosis not present

## 2017-01-18 DIAGNOSIS — Z794 Long term (current) use of insulin: Secondary | ICD-10-CM

## 2017-01-18 DIAGNOSIS — Z9289 Personal history of other medical treatment: Secondary | ICD-10-CM

## 2017-01-18 LAB — COMPREHENSIVE METABOLIC PANEL
ALT: 20 U/L (ref 14–54)
ANION GAP: 14 (ref 5–15)
AST: 19 U/L (ref 15–41)
Albumin: 3.8 g/dL (ref 3.5–5.0)
Alkaline Phosphatase: 112 U/L (ref 38–126)
BUN: 59 mg/dL — ABNORMAL HIGH (ref 6–20)
CALCIUM: 8.8 mg/dL — AB (ref 8.9–10.3)
CO2: 25 mmol/L (ref 22–32)
Chloride: 98 mmol/L — ABNORMAL LOW (ref 101–111)
Creatinine, Ser: 4.86 mg/dL — ABNORMAL HIGH (ref 0.44–1.00)
GFR calc non Af Amer: 10 mL/min — ABNORMAL LOW (ref >60)
GFR, EST AFRICAN AMERICAN: 11 mL/min — AB (ref >60)
Glucose, Bld: 143 mg/dL — ABNORMAL HIGH (ref 65–99)
POTASSIUM: 3.8 mmol/L (ref 3.5–5.1)
SODIUM: 137 mmol/L (ref 135–145)
Total Bilirubin: 0.6 mg/dL (ref 0.3–1.2)
Total Protein: 7.5 g/dL (ref 6.5–8.1)

## 2017-01-18 LAB — CBC WITH DIFFERENTIAL/PLATELET
BASOS ABS: 0 10*3/uL (ref 0.0–0.1)
BASOS PCT: 0 %
Eosinophils Absolute: 0.2 10*3/uL (ref 0.0–0.7)
Eosinophils Relative: 1 %
HEMATOCRIT: 35.8 % — AB (ref 36.0–46.0)
Hemoglobin: 12.1 g/dL (ref 12.0–15.0)
Lymphocytes Relative: 4 %
Lymphs Abs: 0.8 10*3/uL (ref 0.7–4.0)
MCH: 32.3 pg (ref 26.0–34.0)
MCHC: 33.8 g/dL (ref 30.0–36.0)
MCV: 95.5 fL (ref 78.0–100.0)
MONO ABS: 0.6 10*3/uL (ref 0.1–1.0)
Monocytes Relative: 3 %
NEUTROS ABS: 18.7 10*3/uL — AB (ref 1.7–7.7)
NEUTROS PCT: 92 %
Platelets: 201 10*3/uL (ref 150–400)
RBC: 3.75 MIL/uL — ABNORMAL LOW (ref 3.87–5.11)
RDW: 14.6 % (ref 11.5–15.5)
WBC: 20.4 10*3/uL — ABNORMAL HIGH (ref 4.0–10.5)

## 2017-01-18 LAB — GLUCOSE, CAPILLARY: GLUCOSE-CAPILLARY: 210 mg/dL — AB (ref 65–99)

## 2017-01-18 LAB — INFLUENZA PANEL BY PCR (TYPE A & B)
INFLBPCR: NEGATIVE
Influenza A By PCR: NEGATIVE

## 2017-01-18 LAB — LIPASE, BLOOD: Lipase: 75 U/L — ABNORMAL HIGH (ref 11–51)

## 2017-01-18 LAB — APTT: aPTT: 28 seconds (ref 24–36)

## 2017-01-18 LAB — LACTIC ACID, PLASMA
LACTIC ACID, VENOUS: 2.3 mmol/L — AB (ref 0.5–1.9)
LACTIC ACID, VENOUS: 4 mmol/L — AB (ref 0.5–1.9)
Lactic Acid, Venous: 3.2 mmol/L (ref 0.5–1.9)

## 2017-01-18 LAB — PROTIME-INR
INR: 1.03
Prothrombin Time: 13.5 seconds (ref 11.4–15.2)

## 2017-01-18 LAB — TROPONIN I: Troponin I: <0.03 ng/mL (ref <0.03)

## 2017-01-18 LAB — PROCALCITONIN: PROCALCITONIN: 0.26 ng/mL

## 2017-01-18 MED ORDER — OXYCODONE HCL 5 MG PO TABS
10.0000 mg | ORAL_TABLET | Freq: Three times a day (TID) | ORAL | Status: DC | PRN
  Administered 2017-01-18 – 2017-02-01 (×23): 10 mg via ORAL
  Filled 2017-01-18 (×23): qty 2

## 2017-01-18 MED ORDER — ALBUTEROL SULFATE (2.5 MG/3ML) 0.083% IN NEBU
2.5000 mg | INHALATION_SOLUTION | Freq: Once | RESPIRATORY_TRACT | Status: AC
  Administered 2017-01-18: 2.5 mg via RESPIRATORY_TRACT
  Filled 2017-01-18: qty 3

## 2017-01-18 MED ORDER — LEVALBUTEROL HCL 1.25 MG/0.5ML IN NEBU
1.2500 mg | INHALATION_SOLUTION | Freq: Four times a day (QID) | RESPIRATORY_TRACT | Status: DC | PRN
  Administered 2017-01-18: 1.25 mg via RESPIRATORY_TRACT

## 2017-01-18 MED ORDER — AMLODIPINE BESYLATE 5 MG PO TABS: 5.0000 mg | ORAL_TABLET | Freq: Every day | ORAL | Status: DC

## 2017-01-18 MED ORDER — ONDANSETRON HCL 4 MG/2ML IJ SOLN
4.0000 mg | Freq: Four times a day (QID) | INTRAMUSCULAR | Status: DC | PRN
  Administered 2017-01-18 – 2017-02-01 (×11): 4 mg via INTRAVENOUS
  Filled 2017-01-18 (×10): qty 2

## 2017-01-18 MED ORDER — SODIUM CHLORIDE 0.9 % IV BOLUS (SEPSIS): 1000.0000 mL | Freq: Once | INTRAVENOUS | Status: DC

## 2017-01-18 MED ORDER — SODIUM CHLORIDE 0.9 % IV BOLUS (SEPSIS)
250.0000 mL | Freq: Once | INTRAVENOUS | Status: AC
  Administered 2017-01-18: 250 mL via INTRAVENOUS

## 2017-01-18 MED ORDER — ONDANSETRON HCL 4 MG PO TABS
4.0000 mg | ORAL_TABLET | Freq: Four times a day (QID) | ORAL | Status: DC | PRN
  Administered 2017-01-24 – 2017-01-26 (×4): 4 mg via ORAL
  Filled 2017-01-18 (×4): qty 1

## 2017-01-18 MED ORDER — SODIUM CHLORIDE 0.9 % IV BOLUS (SEPSIS)
500.0000 mL | Freq: Once | INTRAVENOUS | Status: AC
  Administered 2017-01-18: 500 mL via INTRAVENOUS

## 2017-01-18 MED ORDER — PIPERACILLIN-TAZOBACTAM 3.375 G IVPB 30 MIN: 3.3750 g | Freq: Once | INTRAVENOUS | Status: DC

## 2017-01-18 MED ORDER — ONDANSETRON HCL 4 MG/2ML IJ SOLN: 4.0000 mg | INTRAMUSCULAR | Status: DC | PRN

## 2017-01-18 MED ORDER — LEVOFLOXACIN IN D5W 750 MG/150ML IV SOLN
750.0000 mg | Freq: Once | INTRAVENOUS | Status: DC
  Filled 2017-01-18: qty 150

## 2017-01-18 MED ORDER — MORPHINE SULFATE (PF) 4 MG/ML IV SOLN: 4.0000 mg | INTRAVENOUS | Status: DC | PRN

## 2017-01-18 MED ORDER — SODIUM CHLORIDE 0.9 % IN NEBU
INHALATION_SOLUTION | RESPIRATORY_TRACT | Status: AC
  Administered 2017-01-18: 3 mL
  Filled 2017-01-18: qty 3

## 2017-01-18 MED ORDER — LEVOFLOXACIN IN D5W 500 MG/100ML IV SOLN
500.0000 mg | INTRAVENOUS | Status: DC
  Administered 2017-01-18 – 2017-01-22 (×3): 500 mg via INTRAVENOUS
  Filled 2017-01-18 (×2): qty 100

## 2017-01-18 MED ORDER — ALPRAZOLAM 0.5 MG PO TABS
0.5000 mg | ORAL_TABLET | Freq: Three times a day (TID) | ORAL | Status: DC | PRN
  Administered 2017-01-18 – 2017-01-20 (×2): 0.5 mg via ORAL
  Filled 2017-01-18 (×2): qty 1

## 2017-01-18 MED ORDER — LEVALBUTEROL HCL 1.25 MG/0.5ML IN NEBU
INHALATION_SOLUTION | RESPIRATORY_TRACT | Status: AC
  Filled 2017-01-18: qty 0.5

## 2017-01-18 MED ORDER — INSULIN ASPART 100 UNIT/ML ~~LOC~~ SOLN
0.0000 [IU] | Freq: Three times a day (TID) | SUBCUTANEOUS | Status: DC
  Administered 2017-01-19 (×2): 2 [IU] via SUBCUTANEOUS
  Administered 2017-01-19: 3 [IU] via SUBCUTANEOUS
  Administered 2017-01-20 – 2017-01-21 (×4): 1 [IU] via SUBCUTANEOUS
  Administered 2017-01-21 – 2017-01-22 (×3): 2 [IU] via SUBCUTANEOUS
  Administered 2017-01-23: 1 [IU] via SUBCUTANEOUS
  Administered 2017-01-23 (×3): 2 [IU] via SUBCUTANEOUS
  Administered 2017-01-24: 3 [IU] via SUBCUTANEOUS
  Administered 2017-01-24: 2 [IU] via SUBCUTANEOUS
  Administered 2017-01-24 – 2017-01-25 (×2): 3 [IU] via SUBCUTANEOUS
  Administered 2017-01-25: 5 [IU] via SUBCUTANEOUS
  Administered 2017-01-25: 3 [IU] via SUBCUTANEOUS
  Administered 2017-01-26: 2 [IU] via SUBCUTANEOUS
  Administered 2017-01-26: 3 [IU] via SUBCUTANEOUS
  Administered 2017-01-26 – 2017-01-27 (×4): 2 [IU] via SUBCUTANEOUS
  Administered 2017-01-28: 3 [IU] via SUBCUTANEOUS
  Administered 2017-01-28: 7 [IU] via SUBCUTANEOUS
  Administered 2017-01-29: 1 [IU] via SUBCUTANEOUS
  Administered 2017-01-29 – 2017-01-30 (×3): 2 [IU] via SUBCUTANEOUS
  Administered 2017-01-30: 5 [IU] via SUBCUTANEOUS
  Administered 2017-01-30: 2 [IU] via SUBCUTANEOUS
  Administered 2017-01-31: 1 [IU] via SUBCUTANEOUS
  Administered 2017-01-31 – 2017-02-01 (×3): 2 [IU] via SUBCUTANEOUS
  Administered 2017-02-01: 3 [IU] via SUBCUTANEOUS

## 2017-01-18 MED ORDER — ACETAMINOPHEN 500 MG PO TABS
1000.0000 mg | ORAL_TABLET | Freq: Once | ORAL | Status: AC
  Administered 2017-01-18: 1000 mg via ORAL
  Filled 2017-01-18: qty 2

## 2017-01-18 MED ORDER — IPRATROPIUM-ALBUTEROL 0.5-2.5 (3) MG/3ML IN SOLN
3.0000 mL | Freq: Once | RESPIRATORY_TRACT | Status: AC
  Administered 2017-01-18: 3 mL via RESPIRATORY_TRACT
  Filled 2017-01-18: qty 3

## 2017-01-18 MED ORDER — METOPROLOL TARTRATE 25 MG PO TABS
25.0000 mg | ORAL_TABLET | Freq: Two times a day (BID) | ORAL | Status: DC
  Administered 2017-01-18: 25 mg via ORAL
  Filled 2017-01-18: qty 1

## 2017-01-18 MED ORDER — OSELTAMIVIR PHOSPHATE 30 MG PO CAPS
30.0000 mg | ORAL_CAPSULE | Freq: Every day | ORAL | Status: AC
  Administered 2017-01-18 – 2017-01-22 (×5): 30 mg via ORAL
  Filled 2017-01-18 (×5): qty 1

## 2017-01-18 MED ORDER — SODIUM CHLORIDE 0.9 % IV SOLN
INTRAVENOUS | Status: DC
  Administered 2017-01-18: via INTRAVENOUS

## 2017-01-18 MED ORDER — CEFAZOLIN IN D5W 1 GM/50ML IV SOLN: 1.0000 g | Freq: Once | INTRAVENOUS | Status: DC

## 2017-01-18 MED ORDER — SODIUM CHLORIDE 0.9 % IV BOLUS (SEPSIS): 500.0000 mL | Freq: Once | INTRAVENOUS | Status: DC

## 2017-01-18 MED ORDER — ALBUTEROL SULFATE (2.5 MG/3ML) 0.083% IN NEBU
2.5000 mg | INHALATION_SOLUTION | Freq: Four times a day (QID) | RESPIRATORY_TRACT | Status: DC | PRN
  Administered 2017-01-20: 2.5 mg via RESPIRATORY_TRACT
  Filled 2017-01-18: qty 3

## 2017-01-18 NOTE — ED Notes (Addendum)
Pt placed on cooling blanket at target range for 98.6.  No rectal probe needed for temp management at this time per Dr. Thurnell Garbe.

## 2017-01-18 NOTE — ED Notes (Signed)
CRITICAL VALUE ALERT  Critical value received:  Lactic acid 2.3 Date of notification: 01/18/17  Time of notification:  [id number]  Critical value read back:Yes.    Nurse who received alert: Dellis Anes MD notified (1st page): Dr. Thurnell Garbe

## 2017-01-18 NOTE — ED Provider Notes (Signed)
[facility] DEPT Provider Note   CSN: [id number] Arrival date & time: 01/18/17  1515     History   Chief Complaint Chief Complaint  Patient presents with  . Shortness of Breath    HPI [name] is a 51 y.o. female.   Shortness of Breath     Pt was seen at 1525. Per EMS and pt report, c/o gradual onset and persistence of constant cough, runny/stuffy nose, sore throat, home fever to "102," body aches, and N/V since yesterday. Pt developed worsening SOB PTA.  Pt states others in her household have similar symptoms. Pt was discharged from the hospital for dx pneumonia/sepsis 2 weeks ago. Pt's last HD was yesterday; pt does state they told her she had "some extra fluid on me." LD "fever reducer" was 2 hours PTA. Denies CP, no abd pain, no diarrhea, no black or blood in stools.   Past Medical History:  Diagnosis Date  . Anxiety   . Asthma    as a child  . Cancer ([place])    thyroid  . Cellulitis   . CHF (congestive heart failure) ([place])   . Chiari malformation    s/p surgery  . Chiari malformation   . Chronic pain   . Complication of anesthesia 11/28/15   Resp arrest after  conscious  sedation  . Constipation   . Coronary artery disease    40-50% mid LAD 04/29/09, Medical tx. (Dr. Gwenlyn Found)  . Diabetes mellitus    Type II  . Fibromyalgia   . History of blood transfusion    hemorrage duinrg pregancy  . Hx of echocardiogram 10/2011   EF 55-60%  . Hypercholesterolemia   . Hypertension   . Lymph edema   . Obesity hypoventilation syndrome ([place])   . On home O2   . Pneumonia    in past  . Renal disorder    Pt started dialysis in Dec.2016  . S/P colonoscopy 05/26/2007   Dr. Laural Golden sigmoid diverticulosis random biopsies benign  . S/P endoscopy 05/01/2009   Dr. Penelope Coop pill-induced esophageal ulcerations distal to midesophagus, 2 small ulcers in the antrum of the stomach  . Shortness of breath dyspnea    with any exertion or if heart rate  is irregular while on dialysis   . Sleep apnea     Patient Active Problem List   Diagnosis Date Noted  . HCAP (healthcare-associated pneumonia) 12/31/2016  . Numbness and tingling in right hand 08/13/2016  . Sepsis ([place]) 08/11/2016  . Coagulase negative Staphylococcus bacteremia 06/11/2016  . UTI (lower urinary tract infection) 06/06/2016  . ESRD (end stage renal disease) ([place]) 06/06/2016  . Chronic cholecystitis 12/09/2015  . Protein-calorie malnutrition, severe 11/29/2015  . Respiratory arrest ([place]) 11/28/2015  . Recurrent cellulitis of lower leg 11/13/2015  . Chronic diastolic CHF (congestive heart failure) ([place]) 11/13/2015  . Chronic respiratory failure with hypoxia ([place]) 11/13/2015  . ESRD on dialysis ([place]) 11/13/2015  . Anemia of chronic disease 11/13/2015  . Rotator cuff syndrome of right shoulder 05/30/2013  . Pain in joint, shoulder region 05/30/2013  . Obesity hypoventilation syndrome ([place]) 09/04/2012  . Leukopenia 11/11/2011  . HTN (hypertension) 11/11/2011  . HLD (hyperlipidemia) 11/11/2011  . Transaminitis 11/11/2011  . Diabetes mellitus ([place]) 10/27/2011  . Morbid obesity ([place]) 10/27/2011  . Lymphedema of lower extremity 10/04/2011    Past Surgical History:  Procedure Laterality Date  . Marland KitchenHemodialysis catheter Right 11/28/2015  . ABDOMINAL HYSTERECTOMY    . ADENOIDECTOMY    . AV  FISTULA PLACEMENT Left 03/15/2016   Procedure: CREATION OF LEFT ARM ARTERIOVENOUS (AV) FISTULA  ;  Surgeon: Rosetta Posner, MD;  Location: [facility];  Service: Vascular;  Laterality: Left;  . CESAREAN SECTION      x 2  . CRANIECTOMY SUBOCCIPITAL W/ CERVICAL LAMINECTOMY / CHIARI    . FISTULA SUPERFICIALIZATION Left 05/10/2016   Procedure: Left Arm FISTULA SUPERFICIALIZATION;  Surgeon: Rosetta Posner, MD;  Location: [facility] OR;  Service: Vascular;  Laterality: Left;  . IR GENERIC HISTORICAL  08/14/2016   IR REMOVAL TUN ACCESS W/ PORT W/O FL MOD SED 08/14/2016 Arne Cleveland, MD [facility]-INTER[facility] RAD  . PERIPHERAL VASCULAR CATHETERIZATION Left  11/25/2016   Procedure: A/V Fistulagram;  Surgeon: Conrad Lynn, MD;  Location: [facility];  Service: Cardiovascular;  Laterality: Left;  arm  . PORTACATH PLACEMENT  07/05/2012   Procedure: INSERTION PORT-A-CATH;  Surgeon: Donato Heinz, MD;  Location: [facility];  Service: General;  Laterality: Left;  subclavian  . THYROIDECTOMY, PARTIAL    . TONSILLECTOMY      OB History    Gravida Para Term Preterm AB Living   _0 SAB TAB Ectopic Multiple Live Births   1               Home Medications    Prior to Admission medications   Medication Sig Start Date End Date Taking? Authorizing Provider  acetaminophen (TYLENOL) 325 MG tablet Take 650 mg by mouth every 6 (six) hours as needed for moderate pain or fever.     Historical Provider, MD  albuterol (PROVENTIL) (2.5 MG/3ML) 0.083% nebulizer solution Take 2.5 mg by nebulization every 6 (six) hours as needed for wheezing or shortness of breath.    Historical Provider, MD  ALPRAZolam Duanne Moron) 0.5 MG tablet Take 0.5 mg by mouth 3 (three) times daily as needed for anxiety or sleep.     Historical Provider, MD  amLODipine (NORVASC) 10 MG tablet Take 0.5 tablets (5 mg total) by mouth daily. 06/11/16   [name], MD  cinacalcet (SENSIPAR) 30 MG tablet Take 30 mg by mouth daily.    Historical Provider, MD  diphenhydrAMINE (BENADRYL) 25 mg capsule Take 25 mg by mouth every 6 (six) hours as needed for allergies.    Historical Provider, MD  doxycycline (VIBRAMYCIN) 50 MG capsule Take 2 capsules (100 mg total) by mouth 2 (two) times daily. 01/06/17   [name], MD  Insulin Glargine (LANTUS SOLOSTAR) 100 UNIT/ML Solostar Pen Inject 10 Units into the skin daily at 10 pm. 01/06/17   [name], MD  metoprolol (LOPRESSOR) 50 MG tablet Take 0.5 tablets (25 mg total) by mouth 2 (two) times daily. 12/04/15   [name], MD  multivitamin (RENA-VIT) TABS tablet Take 1 tablet by mouth daily.    Historical Provider, MD  Oxycodone HCl 10 MG  TABS Take 1 tablet (10 mg total) by mouth 3 (three) times daily as needed (pain). Patient taking differently: Take 10 mg by mouth 4 (four) times daily as needed (pain).  05/10/16   Alvia Grove, PA-C  predniSONE (STERAPRED UNI-PAK 21 TAB) 10 MG (21) TBPK tablet Take by package instructions 01/06/17   [name], MD  sevelamer carbonate (RENVELA) 800 MG tablet Take 1,600-3,200 mg by mouth See admin instructions. Take 3200 mg by mouth 3 times daily with meals and take 1600 mg by mouth with snacks.    Historical Provider, MD    [facility]  History Family History  Problem Relation Age of Onset  . Colon cancer Mother 43  . Stroke Mother 62  . Coronary artery disease Mother     Social History Social History  Substance Use Topics  . Smoking status: Never Smoker  . Smokeless tobacco: Never Used  . Alcohol use No     Allergies   Contrast media [iodinated diagnostic agents]; Pneumococcal vaccines; Shellfish allergy; and Vancomycin   Review of Systems Review of Systems  Respiratory: Positive for shortness of breath.   ROS: Statement: All systems negative except as marked or noted in the HPI; Constitutional: +fever and chills. ; ; Eyes: Negative for eye pain, redness and discharge. ; ; ENMT: Negative for ear pain, hoarseness. +nasal congestion, sinus pressure and sore throat. ; ; Cardiovascular: Negative for chest pain, palpitations, diaphoresis, and peripheral edema. ; ; Respiratory: +SOB, cough. Negative for wheezing and stridor. ; ; Gastrointestinal: Negative for nausea, vomiting, diarrhea, abdominal pain, blood in stool, hematemesis, jaundice and rectal bleeding. . ; ; Genitourinary: Negative for dysuria, flank pain and hematuria. ; ; Musculoskeletal: Negative for back pain and neck pain. Negative for swelling and trauma.; ; Skin: Negative for pruritus, rash, abrasions, blisters, bruising and skin lesion.; ; Neuro: Negative for headache, lightheadedness and neck stiffness. Negative for  weakness, altered level of consciousness, altered mental status, extremity weakness, paresthesias, involuntary movement, seizure and syncope.       Physical Exam Updated Vital Signs BP 149/64 (BP Location: Left Arm)   Pulse 116   Temp 99.9 F (37.7 C) (Oral)   Resp 18   Ht _0  (1.651 m)   Wt (!) 304 lb (137.9 kg)   SpO2 100%   BMI 50.59 kg/m   Physical Exam 1530; Physical examination:  Nursing notes reviewed; Vital signs and O2 SAT reviewed;  Constitutional: Well developed, Well nourished, Well hydrated, In no acute distress; Head:  Normocephalic, atraumatic; Eyes: EOMI, PERRL, No scleral icterus; ENMT: Mouth and pharynx normal, Mucous membranes moist; Neck: Supple, Full range of motion, No lymphadenopathy; Cardiovascular: Tachycardic rate and rhythm, No gallop; Respiratory: Breath sounds coarse & equal bilaterally, No wheezes. Sitting upright. Speaking sentences, Normal respiratory effort/excursion; Chest: Nontender, Movement normal; Abdomen: Soft, Nontender, Nondistended, Normal bowel sounds; Genitourinary: No CVA tenderness; Extremities: Pulses normal, No tenderness, +chronic lymphedema bilat LE's..; Neuro: AA&Ox3, Major CN grossly intact.  Speech clear. No gross focal motor or sensory deficits in extremities.; Skin: Color normal, Warm, Dry.   ED Treatments / Results  Labs (all labs ordered are listed, but only abnormal results are displayed)   EKG  EKG Interpretation  Date/Time:  Tuesday January 18 2017 15:30:38 EST Ventricular Rate:  114 PR Interval:    QRS Duration: 83 QT Interval:  318 QTC Calculation: 438 R Axis:   134 Text Interpretation:  Sinus tachycardia Right axis deviation Low voltage, precordial leads Nonspecific T abnrm, anterolateral leads When compared with ECG of 12/31/2016 QT has shortened and Rate faster Confirmed by [facility]  MD, Nunzio Cory [phone]) on 01/18/2017 3:40:51 PM       Radiology   Procedures Procedures (including critical care  time)  Medications Ordered in ED Medications  ondansetron (ZOFRAN) injection 4 mg (not administered)     Initial Impression / Assessment and Plan / ED Course  I have reviewed the triage vital signs and the nursing notes.  Pertinent labs & imaging results that were available during my care of the patient were reviewed by me and considered in my medical decision making (see  chart for details).  MDM Reviewed: previous chart, nursing note and vitals Reviewed previous: labs and ECG Interpretation: labs, ECG and x-ray Total time providing critical care: 30-74 minutes. This excludes time spent performing separately reportable procedures and services. Consults: admitting MD   CRITICAL CARE Performed by: Alfonzo Feller Total critical care time: 35 minutes Critical care time was exclusive of separately billable procedures and treating other patients. Critical care was necessary to treat or prevent imminent or life-threatening deterioration. Critical care was time spent personally by me on the following activities: development of treatment plan with patient and/or surrogate as well as nursing, discussions with consultants, evaluation of patient's response to treatment, examination of patient, obtaining history from patient or surrogate, ordering and performing treatments and interventions, ordering and review of laboratory studies, ordering and review of radiographic studies, pulse oximetry and re-evaluation of patient's condition.  Results for orders placed or performed during the hospital encounter of 01/18/17  Influenza panel by PCR (type A & B)  Result Value Ref Range   Influenza A By PCR NEGATIVE NEGATIVE   Influenza B By PCR NEGATIVE NEGATIVE  Comprehensive metabolic panel  Result Value Ref Range   Sodium 137 135 - 145 mmol/L   Potassium 3.8 3.5 - 5.1 mmol/L   Chloride 98 (L) 101 - 111 mmol/L   CO2 25 22 - 32 mmol/L   Glucose, Bld 143 (H) 65 - 99 mg/dL   BUN 59 (H) 6 - 20 mg/dL    Creatinine, Ser 4.86 (H) 0.44 - 1.00 mg/dL   Calcium 8.8 (L) 8.9 - 10.3 mg/dL   Total Protein 7.5 6.5 - 8.1 g/dL   Albumin 3.8 3.5 - 5.0 g/dL   AST 19 15 - 41 U/L   ALT 20 14 - 54 U/L   Alkaline Phosphatase 112 38 - 126 U/L   Total Bilirubin 0.6 0.3 - 1.2 mg/dL   GFR calc non Af Amer 10 (L) >60 mL/min   GFR calc Af Amer 11 (L) >60 mL/min   Anion gap 14 5 - 15  Lipase, blood  Result Value Ref Range   Lipase 75 (H) 11 - 51 U/L  Lactic acid, plasma  Result Value Ref Range   Lactic Acid, Venous 2.3 (HH) 0.5 - 1.9 mmol/L  Lactic acid, plasma  Result Value Ref Range   Lactic Acid, Venous 4.0 (HH) 0.5 - 1.9 mmol/L  CBC with Differential  Result Value Ref Range   WBC 20.4 (H) 4.0 - 10.5 K/uL   RBC 3.75 (L) 3.87 - 5.11 MIL/uL   Hemoglobin 12.1 12.0 - 15.0 g/dL   HCT 35.8 (L) 36.0 - 46.0 %   MCV 95.5 78.0 - 100.0 fL   MCH 32.3 26.0 - 34.0 pg   MCHC 33.8 30.0 - 36.0 g/dL   RDW 14.6 11.5 - 15.5 %   Platelets 201 150 - 400 K/uL   Neutrophils Relative % 92 %   Neutro Abs 18.7 (H) 1.7 - 7.7 K/uL   Lymphocytes Relative 4 %   Lymphs Abs 0.8 0.7 - 4.0 K/uL   Monocytes Relative 3 %   Monocytes Absolute 0.6 0.1 - 1.0 K/uL   Eosinophils Relative 1 %   Eosinophils Absolute 0.2 0.0 - 0.7 K/uL   Basophils Relative 0 %   Basophils Absolute 0.0 0.0 - 0.1 K/uL  Troponin I  Result Value Ref Range   Troponin I <0.03 <0.03 ng/mL    Dg Chest Port 1 View Result Date: 01/18/2017 CLINICAL DATA:  Short  of breath, cough, fever and chills today. Pneumonia 2 weeks ago. EXAM: PORTABLE CHEST 1 VIEW COMPARISON:  12/31/2016. FINDINGS: Mild enlargement of the cardiac silhouette, stable. No mediastinal or hilar masses. No convincing adenopathy. Mild vascular congestion. No convincing pulmonary edema. No evidence of pneumonia. No convincing pleural effusion.  No pneumothorax. Skeletal structures are intact. IMPRESSION: No convincing acute cardiopulmonary disease. No definitive pneumonia or pulmonary edema.  Electronically Signed   By: Lajean Manes M.D.   On: 01/18/2017 16:02    1915:  WBC count elevated, initial lactic acid normal. BP stable. APAP given for fever. Dx and testing d/w pt and family.  Questions answered.  Verb understanding, agreeable to admit. T/C to [facility] Dr. Shanon Brow, case discussed, including:  HPI, pertinent PM/SHx, VS/PE, dx testing, ED course and treatment:  Agreeable to admit, requests to write temporary orders, obtain med bed to Dr [name]' service.  1930:  2nd lactic acid elevated. Code Sepsis called. BP is stable/no hypotension. Concerned regarding large IVF boluses with resultant fluid overload in pt with hx ESRD on HD; will continue gentle IVF. BC already obtained; will dose IV abx. Pt does not make urine. Pt c/o left lower leg "soreness" and "redness" at dorsal ankle area: Skin with chronic stasis changes, no clear erythema, no open wounds.     Final Clinical Impressions(s) / ED Diagnoses   Final diagnoses:  None    New Prescriptions New Prescriptions   No medications on file      Francine Graven, DO 01/22/17 1322

## 2017-01-18 NOTE — H&P (Signed)
History and Physical    [name] VPL:[id number] DOB: 09-15-1966 DOA: 01/18/2017  PCP: Alonza Bogus, MD  Patient coming from: home  Chief Complaint:  fever  HPI: [name] is a 51 y.o. female with medical history significant of ESRD last dialysis was yesterday, asthma, CAD, CHF, CRF on 3 liters ivf , chronic lymphedema to legs, recurrent cellulitis to leg come in with one day of not feeling well with fever.  Pt reports she was hospitalized 2 weeks ago with pna and sepsis and she has been recovering well from that , however several family members have had the flu.  Today she started feeling unwell, fever and her left leg started to become painful.  She denies any nasal congestion or sore throat but still has cough from her pna several weeks ago.  There is no red rash to her leg, but she says this is what it feels like before the rash starts.   Pt seen by dr Elise Benne in ED, presented pt with likely flu, flu is pending.  Pt referred for admission for flu like symptoms.   Review of Systems: As per HPI otherwise 10 point review of systems negative.   Past Medical History:  Diagnosis Date  . Anxiety   . Asthma    as a child  . Cancer ([place])    thyroid  . Cellulitis   . CHF (congestive heart failure) ([place])   . Chiari malformation    s/p surgery  . Chiari malformation   . Chronic pain   . Complication of anesthesia 11/28/15   Resp arrest after  conscious  sedation  . Constipation   . Coronary artery disease    40-50% mid LAD 04/29/09, Medical tx. (Dr. Gwenlyn Found)  . Diabetes mellitus    Type II  . Fibromyalgia   . History of blood transfusion    hemorrage duinrg pregancy  . Hx of echocardiogram 10/2011   EF 55-60%  . Hypercholesterolemia   . Hypertension   . Lymph edema   . Obesity hypoventilation syndrome ([place])   . On home O2   . Pneumonia    in past  . Renal disorder    Pt started dialysis in Dec.2016  . S/P colonoscopy 05/26/2007   Dr. Laural Golden sigmoid  diverticulosis random biopsies benign  . S/P endoscopy 05/01/2009   Dr. Penelope Coop pill-induced esophageal ulcerations distal to midesophagus, 2 small ulcers in the antrum of the stomach  . Shortness of breath dyspnea    with any exertion or if heart rate  is irregular while on dialysis  . Sleep apnea     Past Surgical History:  Procedure Laterality Date  . Marland KitchenHemodialysis catheter Right 11/28/2015  . ABDOMINAL HYSTERECTOMY    . ADENOIDECTOMY    . AV FISTULA PLACEMENT Left 03/15/2016   Procedure: CREATION OF LEFT ARM ARTERIOVENOUS (AV) FISTULA  ;  Surgeon: Rosetta Posner, MD;  Location: [facility];  Service: Vascular;  Laterality: Left;  . CESAREAN SECTION      x 2  . CRANIECTOMY SUBOCCIPITAL W/ CERVICAL LAMINECTOMY / CHIARI    . FISTULA SUPERFICIALIZATION Left 05/10/2016   Procedure: Left Arm FISTULA SUPERFICIALIZATION;  Surgeon: Rosetta Posner, MD;  Location: [facility] OR;  Service: Vascular;  Laterality: Left;  . IR GENERIC HISTORICAL  08/14/2016   IR REMOVAL TUN ACCESS W/ PORT W/O FL MOD SED 08/14/2016 Arne Cleveland, MD [facility]-INTERV RAD  . PERIPHERAL VASCULAR CATHETERIZATION Left 11/25/2016   Procedure: A/V Fistulagram;  Surgeon: Jannette Fogo  Bridgett Larsson, MD;  Location: [facility] CV LAB;  Service: Cardiovascular;  Laterality: Left;  arm  . PORTACATH PLACEMENT  07/05/2012   Procedure: INSERTION PORT-A-CATH;  Surgeon: Donato Heinz, MD;  Location: [facility];  Service: General;  Laterality: Left;  subclavian  . THYROIDECTOMY, PARTIAL    . TONSILLECTOMY       reports that she has never smoked. She has never used smokeless tobacco. She reports that she does not drink alcohol or use drugs.  Allergies  Allergen Reactions  . Contrast Media [Iodinated Diagnostic Agents] Anaphylaxis, Hives, Swelling and Other (See Comments)    Dye for cardiac cath. Tongue swells  . Pneumococcal Vaccines Swelling    Turns skin black, and bodily swelling  . Shellfish Allergy Itching, Swelling and Other (See Comments)    Facial swelling - Pt  able to eat now 11/25/16  . Vancomycin Nausea And Vomiting and Other (See Comments)    Infusion "made me feel like I was dying" had to be readmitted to hospital    Family History  Problem Relation Age of Onset  . Colon cancer Mother 83  . Stroke Mother 33  . Coronary artery disease Mother     Prior to Admission medications   Medication Sig Start Date End Date Taking? Authorizing Provider  acetaminophen (TYLENOL) 325 MG tablet Take 650 mg by mouth every 6 (six) hours as needed for moderate pain or fever.    Yes Historical Provider, MD  albuterol (PROVENTIL) (2.5 MG/3ML) 0.083% nebulizer solution Take 2.5 mg by nebulization every 6 (six) hours as needed for wheezing or shortness of breath.   Yes Historical Provider, MD  ALPRAZolam Duanne Moron) 0.5 MG tablet Take 0.5 mg by mouth 3 (three) times daily as needed for anxiety or sleep.    Yes Historical Provider, MD  amLODipine (NORVASC) 10 MG tablet Take 0.5 tablets (5 mg total) by mouth daily. 06/11/16  Yes [name], MD  cinacalcet (SENSIPAR) 30 MG tablet Take 30 mg by mouth daily.   Yes Historical Provider, MD  diphenhydrAMINE (BENADRYL) 25 mg capsule Take 25 mg by mouth every 6 (six) hours as needed for allergies.   Yes Historical Provider, MD  Insulin Glargine (LANTUS SOLOSTAR) 100 UNIT/ML Solostar Pen Inject 10 Units into the skin daily at 10 pm. 01/06/17  Yes [name], MD  metoprolol (LOPRESSOR) 50 MG tablet Take 0.5 tablets (25 mg total) by mouth 2 (two) times daily. 12/04/15  Yes [name], MD  multivitamin (RENA-VIT) TABS tablet Take 1 tablet by mouth daily.   Yes Historical Provider, MD  Oxycodone HCl 10 MG TABS Take 1 tablet (10 mg total) by mouth 3 (three) times daily as needed (pain). Patient taking differently: Take 10 mg by mouth 4 (four) times daily as needed (pain).  05/10/16  Yes Alvia Grove, PA-C  sevelamer carbonate (RENVELA) 800 MG tablet Take 1,600-3,200 mg by mouth See admin instructions. Take 3200 mg by mouth  3 times daily with meals and take 1600 mg by mouth with snacks.   Yes Historical Provider, MD  doxycycline (VIBRAMYCIN) 50 MG capsule Take 2 capsules (100 mg total) by mouth 2 (two) times daily. Patient not taking: Reported on 01/18/2017 01/06/17   [name], MD  predniSONE [facility] UNI-PAK 21 TAB) 10 MG (21) TBPK tablet Take by package instructions Patient not taking: Reported on 01/18/2017 01/06/17   [name], MD    Physical Exam: Vitals:   01/18/17 2030 01/18/17 2031 01/18/17 2058 01/18/17 2104  BP: Marland Kitchen)  81/68 (!) 78/50  97/64  Pulse: 117 117  111  Resp:    22  Temp:    98.5 F (36.9 C)  TempSrc:    Oral  SpO2: 100% 100% 100% 100%  Weight:      Height:        Constitutional: NAD, calm, comfortable Vitals:   01/18/17 2030 01/18/17 2031 01/18/17 2058 01/18/17 2104  BP: (!) 81/68 (!) 78/50  97/64  Pulse: 117 117  111  Resp:    22  Temp:    98.5 F (36.9 C)  TempSrc:    Oral  SpO2: 100% 100% 100% 100%  Weight:      Height:       Eyes: PERRL, lids and conjunctivae normal ENMT: Mucous membranes are moist. Posterior pharynx clear of any exudate or lesions.Normal dentition.  Neck: normal, supple, no masses, no thyromegaly Respiratory: clear to auscultation bilaterally, no wheezing, no crackles. Normal respiratory effort. No accessory muscle use.  Cardiovascular: Regular rate and rhythm, no murmurs / rubs / gallops. No extremity edema. 2+ pedal pulses. No carotid bruits.  Abdomen: no tenderness, no masses palpated. No hepatosplenomegaly. Bowel sounds positive.  Musculoskeletal: no clubbing / cyanosis. No joint deformity upper and lower extremities. Good ROM, no contractures. Normal muscle tone.   Bilateral lymphedema LE.  No redness anywhere, painful to touch to left leg Skin: no rashes, lesions, ulcers. No induration Neurologic: CN 2-12 grossly intact. Sensation intact, DTR normal. Strength 5/5 in all 4.  Psychiatric: Normal judgment and insight. Alert and oriented x 3.  Normal mood.    Labs on Admission: I have personally reviewed following labs and imaging studies  CBC:  Recent Labs Lab 01/18/17 1534  WBC 20.4*  NEUTROABS 18.7*  HGB 12.1  HCT 35.8*  MCV 95.5  PLT 528   Basic Metabolic Panel:  Recent Labs Lab 01/18/17 1534  NA 137  K 3.8  CL 98*  CO2 25  GLUCOSE 143*  BUN 59*  CREATININE 4.86*  CALCIUM 8.8*   GFR: Estimated Creatinine Clearance: 19.5 mL/min (by C-G formula based on SCr of 4.86 mg/dL (H)). Liver Function Tests:  Recent Labs Lab 01/18/17 1534  AST 19  ALT 20  ALKPHOS 112  BILITOT 0.6  PROT 7.5  ALBUMIN 3.8    Recent Labs Lab 01/18/17 1534  LIPASE 75*    Cardiac Enzymes:  Recent Labs Lab 01/18/17 1534  TROPONINI <0.03    Urine analysis:    Component Value Date/Time   COLORURINE YELLOW 08/11/2016 1800   APPEARANCEUR HAZY (A) 08/11/2016 1800   LABSPEC 1.020 08/11/2016 1800   PHURINE 5.5 08/11/2016 1800   GLUCOSEU NEGATIVE 08/11/2016 1800   HGBUR MODERATE (A) 08/11/2016 1800   BILIRUBINUR NEGATIVE 08/11/2016 1800   KETONESUR NEGATIVE 08/11/2016 1800   PROTEINUR 100 (A) 08/11/2016 1800   UROBILINOGEN 0.2 12/02/2014 1925   NITRITE NEGATIVE 08/11/2016 1800   LEUKOCYTESUR NEGATIVE 08/11/2016 1800   Radiological Exams on Admission: Dg Chest Port 1 View  Result Date: 01/18/2017 CLINICAL DATA:  Short of breath, cough, fever and chills today. Pneumonia 2 weeks ago. EXAM: PORTABLE CHEST 1 VIEW COMPARISON:  12/31/2016. FINDINGS: Mild enlargement of the cardiac silhouette, stable. No mediastinal or hilar masses. No convincing adenopathy. Mild vascular congestion. No convincing pulmonary edema. No evidence of pneumonia. No convincing pleural effusion.  No pneumothorax. Skeletal structures are intact. IMPRESSION: No convincing acute cardiopulmonary disease. No definitive pneumonia or pulmonary edema. Electronically Signed   By: Dedra Skeens.D.  On: 01/18/2017 16:02    EKG: Independently reviewed.  Sinus tachycardia cxr reviewed no edema or infiltrate Old record reviewed Case discussed with dr Elise Benne Case discussed with RN ED  Assessment/Plan 51 yo female ESRD dialysis MWF comes in with left leg pain, fever  Principal Problem:   Fever- unclear source.  Suspect she is developing cellulitis to her left leg.  Flu is neg.  cxr is neg.  Lactate has increased from 2.4 to 4 in the last 3 hours.  Will give small boluses.  Dr Elise Benne ordered sepsis protocol which would be over 4 liters of bolus, will cancel this as pt is a dialysis patient also vanc/zosyn ordered but pt is allergic to vancomycin this will also be cancelled.  Serial lactic q 3 hours.  Place on levaquin.   Blood cultures obtained.  Give tylenol for fever.  bp is soft, hold all bp meds.  1.5 liters ivf bolus ordered, will bolus more after reassessment of patient when this is complete. (was on doxy for her pna 2 weeks ago)  Active Problems:   Diabetes mellitus ([place])-  SSI   Morbid obesity ([place])- noted   Obesity hypoventilation syndrome ([place])- cont 3 liters , 100% sats on this at this time   Chronic diastolic CHF (congestive heart failure) ([place])- stable at this time   Chronic respiratory failure with hypoxia ([place])- stable on her 3 liters   ESRD on dialysis [facility])- consult nephrology for dialysis in the am, no emergent needs at this time for dialysis    H/o asthma - change nebs to xoponex due to tachycardia      DVT prophylaxis:  scds  Code Status:  full Family Communication: none Disposition Plan:  Per day team Consults called:  nephrology Admission status:  Admission to stepdown   Mairin Lindsley A MD [facility]  If 7PM-7AM, please contact night-coverage [email or web address] Password [email or web address]  01/18/2017, 9:09 PM

## 2017-01-18 NOTE — ED Notes (Signed)
CRITICAL VALUE ALERT  Critical value received:  Lactic acid 4.0  Date of notification:  01/18/17  Time of notification:  1931  Critical value read back:Yes.    Nurse who received alert:  Dellis Anes  MD notified (1st page):  Dr. Thurnell Garbe

## 2017-01-18 NOTE — ED Notes (Signed)
Dr Shanon Brow notified of pt's manual bp and care discussed with her per dr Madaline Brilliant to give pt oxycodone for pain

## 2017-01-18 NOTE — ED Triage Notes (Addendum)
Per EMS: Pt is on 3L, EMS tried pt on RA and was 75%.  Pt currently takes dialysis MWF, last tx yesterday.  Pt has been sob x45 minutes, on NRB at this time by ems.  No meds given en route.  Has lymphedema to left foot. Pt took "fever reducer" 2 hours ago and has also had emesis starting before EMS arrived. Pt alert and oriented.  HR 120, cbg 164.

## 2017-01-18 NOTE — Progress Notes (Signed)
Pharmacy Antibiotic Note  [name] is a 51 y.o. female admitted on 01/18/2017 with potential sepsis.  Pharmacy has been consulted for Levaquin dosing.  Plan: Levaquin 797m IV x 1, then 5053mIV every 48 hours. ESRD Monitor labs, micro and vitals.   Height: _0  (165.1 cm) Weight: (!) 304 lb (137.9 kg) IBW/kg (Calculated) : 57  Temp (24hrs), Avg:101.7 F (38.7 C), Min:99.9 F (37.7 C), Max:103.5 F (39.7 C)   Recent Labs Lab 01/18/17 1534 01/18/17 1541 01/18/17 1812  WBC 20.4*  --   --   CREATININE 4.86*  --   --   LATICACIDVEN  --  2.3* 4.0*    Estimated Creatinine Clearance: 19.5 mL/min (by C-G formula based on SCr of 4.86 mg/dL (H)).    Allergies  Allergen Reactions  . Contrast Media [Iodinated Diagnostic Agents] Anaphylaxis, Hives, Swelling and Other (See Comments)    Dye for cardiac cath. Tongue swells  . Pneumococcal Vaccines Swelling    Turns skin black, and bodily swelling  . Shellfish Allergy Itching, Swelling and Other (See Comments)    Facial swelling - Pt able to eat now 11/25/16  . Vancomycin Nausea And Vomiting and Other (See Comments)    Infusion "made me feel like I was dying" had to be readmitted to hospital    Antimicrobials this admission: Levaquin 1/30 >>   Dose adjustments this admission: ESRD dosing  Microbiology results: 1/30 Bcx: pending  1/30 MRSA PCR: pending  Thank you for allowing pharmacy to be a part of this patient's care.  HaPricilla Larsson/30/2018 8:36 PM

## 2017-01-18 NOTE — ED Notes (Signed)
Notified vanessa, RT about treatments due.

## 2017-01-18 NOTE — Progress Notes (Signed)
[facility] Progress Note Patient Name: ALESSA MAZUR DOB: May 04, 1966 MRN: [id number]   Date of Service  01/18/2017  HPI/Events of Note  Sepsis - Repeat Assessment   Vitals     Blood pressure 116/70, pulse 117, temperature (!) 103.5 F (39.7 C), temperature source Rectal, resp. rate 18, height 5' 5" (1.651 m), weight (!) 137.9 kg (304 lb), SpO2 97 %.  LA increased 2.4-->4    eICU Interventions  Will give 250-500 CC NS bolus Recheck LA     Intervention Category Major Interventions: Sepsis - evaluation and management  Mahaila Tischer 01/18/2017, 8:07 PM

## 2017-01-19 ENCOUNTER — Inpatient Hospital Stay (HOSPITAL_COMMUNITY): Payer: Medicare Other

## 2017-01-19 DIAGNOSIS — N186 End stage renal disease: Secondary | ICD-10-CM | POA: Diagnosis not present

## 2017-01-19 DIAGNOSIS — Z992 Dependence on renal dialysis: Secondary | ICD-10-CM | POA: Diagnosis not present

## 2017-01-19 LAB — CBC WITH DIFFERENTIAL/PLATELET
BASOS ABS: 0 10*3/uL (ref 0.0–0.1)
BASOS PCT: 0 %
Eosinophils Absolute: 0 10*3/uL (ref 0.0–0.7)
Eosinophils Relative: 0 %
HEMATOCRIT: 29 % — AB (ref 36.0–46.0)
Hemoglobin: 9.6 g/dL — ABNORMAL LOW (ref 12.0–15.0)
LYMPHS PCT: 6 %
Lymphs Abs: 0.7 10*3/uL (ref 0.7–4.0)
MCH: 31.6 pg (ref 26.0–34.0)
MCHC: 33.1 g/dL (ref 30.0–36.0)
MCV: 95.4 fL (ref 78.0–100.0)
MONO ABS: 0.3 10*3/uL (ref 0.1–1.0)
Monocytes Relative: 3 %
NEUTROS ABS: 10.9 10*3/uL — AB (ref 1.7–7.7)
Neutrophils Relative %: 91 %
PLATELETS: 146 10*3/uL — AB (ref 150–400)
RBC: 3.04 MIL/uL — AB (ref 3.87–5.11)
RDW: 15.1 % (ref 11.5–15.5)
WBC: 11.9 10*3/uL — AB (ref 4.0–10.5)

## 2017-01-19 LAB — BASIC METABOLIC PANEL
ANION GAP: 13 (ref 5–15)
BUN: 58 mg/dL — ABNORMAL HIGH (ref 6–20)
CHLORIDE: 104 mmol/L (ref 101–111)
CO2: 18 mmol/L — ABNORMAL LOW (ref 22–32)
Calcium: 7.1 mg/dL — ABNORMAL LOW (ref 8.9–10.3)
Creatinine, Ser: 4.84 mg/dL — ABNORMAL HIGH (ref 0.44–1.00)
GFR calc non Af Amer: 10 mL/min — ABNORMAL LOW (ref >60)
GFR, EST AFRICAN AMERICAN: 11 mL/min — AB (ref >60)
Glucose, Bld: 193 mg/dL — ABNORMAL HIGH (ref 65–99)
POTASSIUM: 4.4 mmol/L (ref 3.5–5.1)
SODIUM: 135 mmol/L (ref 135–145)

## 2017-01-19 LAB — BLOOD CULTURE ID PANEL (REFLEXED)
Acinetobacter baumannii: NOT DETECTED
CARBAPENEM RESISTANCE: NOT DETECTED
Candida albicans: NOT DETECTED
Candida glabrata: NOT DETECTED
Candida krusei: NOT DETECTED
Candida parapsilosis: NOT DETECTED
Candida tropicalis: NOT DETECTED
ENTEROBACTERIACEAE SPECIES: DETECTED — AB
ENTEROCOCCUS SPECIES: NOT DETECTED
Enterobacter cloacae complex: NOT DETECTED
Escherichia coli: NOT DETECTED
HAEMOPHILUS INFLUENZAE: NOT DETECTED
Klebsiella oxytoca: NOT DETECTED
Klebsiella pneumoniae: NOT DETECTED
LISTERIA MONOCYTOGENES: NOT DETECTED
Neisseria meningitidis: NOT DETECTED
PSEUDOMONAS AERUGINOSA: NOT DETECTED
Proteus species: NOT DETECTED
STAPHYLOCOCCUS AUREUS BCID: NOT DETECTED
STREPTOCOCCUS AGALACTIAE: NOT DETECTED
STREPTOCOCCUS PNEUMONIAE: NOT DETECTED
STREPTOCOCCUS PYOGENES: NOT DETECTED
STREPTOCOCCUS SPECIES: NOT DETECTED
Serratia marcescens: DETECTED — AB
Staphylococcus species: NOT DETECTED

## 2017-01-19 LAB — CBC
HEMATOCRIT: 29.1 % — AB (ref 36.0–46.0)
HEMOGLOBIN: 9.6 g/dL — AB (ref 12.0–15.0)
MCH: 31.8 pg (ref 26.0–34.0)
MCHC: 33 g/dL (ref 30.0–36.0)
MCV: 96.4 fL (ref 78.0–100.0)
PLATELETS: 121 10*3/uL — AB (ref 150–400)
RBC: 3.02 MIL/uL — AB (ref 3.87–5.11)
RDW: 14.8 % (ref 11.5–15.5)
WBC: 9.5 10*3/uL (ref 4.0–10.5)

## 2017-01-19 LAB — GLUCOSE, CAPILLARY
GLUCOSE-CAPILLARY: 169 mg/dL — AB (ref 65–99)
Glucose-Capillary: 224 mg/dL — ABNORMAL HIGH (ref 65–99)
Glucose-Capillary: 129 mg/dL — ABNORMAL HIGH (ref 65–99)
Glucose-Capillary: 117 mg/dL — ABNORMAL HIGH (ref 65–99)

## 2017-01-19 LAB — MRSA PCR SCREENING: MRSA by PCR: NEGATIVE

## 2017-01-19 LAB — LACTIC ACID, PLASMA
LACTIC ACID, VENOUS: 2.9 mmol/L — AB (ref 0.5–1.9)
Lactic Acid, Venous: 3.4 mmol/L (ref 0.5–1.9)
Lactic Acid, Venous: 2.4 mmol/L (ref 0.5–1.9)

## 2017-01-19 MED ORDER — ORAL CARE MOUTH RINSE
15.0000 mL | Freq: Two times a day (BID) | OROMUCOSAL | Status: DC
  Administered 2017-01-19 – 2017-02-01 (×27): 15 mL via OROMUCOSAL

## 2017-01-19 MED ORDER — PENTAFLUOROPROP-TETRAFLUOROETH EX AERO
1.0000 "application " | INHALATION_SPRAY | CUTANEOUS | Status: DC | PRN
  Filled 2017-01-19: qty 30

## 2017-01-19 MED ORDER — LIDOCAINE-PRILOCAINE 2.5-2.5 % EX CREA
1.0000 "application " | TOPICAL_CREAM | CUTANEOUS | Status: DC | PRN
  Filled 2017-01-19: qty 5

## 2017-01-19 MED ORDER — SODIUM CHLORIDE 0.9 % IV BOLUS (SEPSIS)
500.0000 mL | Freq: Once | INTRAVENOUS | Status: AC
  Administered 2017-01-19: 500 mL via INTRAVENOUS

## 2017-01-19 MED ORDER — ALBUMIN HUMAN 25 % IV SOLN
25.0000 g | Freq: Once | INTRAVENOUS | Status: AC
  Administered 2017-01-19: 25 g via INTRAVENOUS
  Filled 2017-01-19: qty 100

## 2017-01-19 MED ORDER — HEPARIN SODIUM (PORCINE) 1000 UNIT/ML DIALYSIS
20.0000 [IU]/kg | INTRAMUSCULAR | Status: DC | PRN
  Administered 2017-01-21: 2900 [IU] via INTRAVENOUS_CENTRAL
  Filled 2017-01-19 (×2): qty 3

## 2017-01-19 MED ORDER — SODIUM CHLORIDE 0.9 % IV SOLN: 100.0000 mL | INTRAVENOUS | Status: DC | PRN

## 2017-01-19 MED ORDER — HEPARIN SODIUM (PORCINE) 1000 UNIT/ML DIALYSIS
1000.0000 [IU] | INTRAMUSCULAR | Status: DC | PRN
  Filled 2017-01-19: qty 1

## 2017-01-19 MED ORDER — DIPHENHYDRAMINE HCL 25 MG PO CAPS
25.0000 mg | ORAL_CAPSULE | Freq: Four times a day (QID) | ORAL | Status: DC | PRN
Start: 2017-01-19 — End: 2017-01-19

## 2017-01-19 MED ORDER — ALBUMIN HUMAN 25 % IV SOLN
INTRAVENOUS | Status: AC
  Administered 2017-01-19: 25 g via INTRAVENOUS
  Filled 2017-01-19: qty 100

## 2017-01-19 MED ORDER — EPOETIN ALFA 4000 UNIT/ML IJ SOLN
INTRAMUSCULAR | Status: AC
  Administered 2017-01-19: 8000 [IU] via INTRAVENOUS
  Filled 2017-01-19: qty 2

## 2017-01-19 MED ORDER — ALTEPLASE 2 MG IJ SOLR
2.0000 mg | Freq: Once | INTRAMUSCULAR | Status: DC | PRN
  Filled 2017-01-19: qty 2

## 2017-01-19 MED ORDER — ACETAMINOPHEN 325 MG PO TABS
650.0000 mg | ORAL_TABLET | ORAL | Status: DC | PRN
  Administered 2017-01-19 – 2017-01-25 (×10): 650 mg via ORAL
  Filled 2017-01-19 (×10): qty 2

## 2017-01-19 MED ORDER — LIDOCAINE HCL (PF) 1 % IJ SOLN: 5.0000 mL | INTRAMUSCULAR | Status: DC | PRN

## 2017-01-19 MED ORDER — EPOETIN ALFA 4000 UNIT/ML IJ SOLN
8000.0000 [IU] | INTRAMUSCULAR | Status: DC
  Administered 2017-01-19 – 2017-01-31 (×4): 8000 [IU] via INTRAVENOUS
  Filled 2017-01-19 (×8): qty 2

## 2017-01-19 NOTE — Consult Note (Signed)
[name] MRN: [id number] DOB/AGE: 1966-09-16 51 y.o. Primary Care Physician:HAWKINS,EDWARD L, MD Admit date: 01/18/2017 Chief Complaint:  Chief Complaint  Patient presents with  . Shortness of Breath   HPI: Pt is  a 51 year old  [place] American  female with past medical hx of ESRD on HD (Mon/Wed/Fri) who presented to the ER with shortness of breath.  HPI dates back to yesterday when pt stared having shortness of breath . Pt c/o feeling malaise Pt c/o nausea Pt came to ER as she was not feeling good, upon evaluation pt had high lactate and  was  admitted her for possible cellulitis.  Pt seen in ICU. Pt says  " I still have pain in my leg"  "I still have nausea and they gave me something for it." " I am now awaiting PICC line". Pt offers no c/o chest pain Pt last HD was on Monday.  Past Medical History:  Diagnosis Date  . Anxiety   . Asthma    as a child  . Cancer ([place])    thyroid  . Cellulitis   . CHF (congestive heart failure) ([place])   . Chiari malformation    s/p surgery  . Chiari malformation   . Chronic pain   . Complication of anesthesia 11/28/15   Resp arrest after  conscious  sedation  . Constipation   . Coronary artery disease    40-50% mid LAD 04/29/09, Medical tx. (Dr. Gwenlyn Found)  . Diabetes mellitus    Type II  . Fibromyalgia   . History of blood transfusion    hemorrage duinrg pregancy  . Hx of echocardiogram 10/2011   EF 55-60%  . Hypercholesterolemia   . Hypertension   . Lymph edema   . Obesity hypoventilation syndrome ([place])   . On home O2   . Pneumonia    in past  . Renal disorder    Pt started dialysis in Dec.2016  . S/P colonoscopy 05/26/2007   Dr. Laural Golden sigmoid diverticulosis random biopsies benign  . S/P endoscopy 05/01/2009   Dr. Penelope Coop pill-induced esophageal ulcerations distal to midesophagus, 2 small ulcers in the antrum of the stomach  . Shortness of breath dyspnea    with any exertion or if heart rate  is irregular while on  dialysis  . Sleep apnea        Family History  Problem Relation Age of Onset  . Colon cancer Mother 28  . Stroke Mother 72  . Coronary artery disease Mother     Social History:  reports that she has never smoked. She has never used smokeless tobacco. She reports that she does not drink alcohol or use drugs.   Allergies:  Allergies  Allergen Reactions  . Contrast Media [Iodinated Diagnostic Agents] Anaphylaxis, Hives, Swelling and Other (See Comments)    Dye for cardiac cath. Tongue swells  . Pneumococcal Vaccines Swelling    Turns skin black, and bodily swelling  . Shellfish Allergy Itching, Swelling and Other (See Comments)    Facial swelling - Pt able to eat now 11/25/16  . Vancomycin Nausea And Vomiting and Other (See Comments)    Infusion "made me feel like I was dying" had to be readmitted to hospital    Medications Prior to Admission  Medication Sig Dispense Refill  . acetaminophen (TYLENOL) 325 MG tablet Take 650 mg by mouth every 6 (six) hours as needed for moderate pain or fever.     Marland Kitchen albuterol (PROVENTIL) (2.5 MG/3ML) 0.083% nebulizer solution  Take 2.5 mg by nebulization every 6 (six) hours as needed for wheezing or shortness of breath.    . ALPRAZolam (XANAX) 0.5 MG tablet Take 0.5 mg by mouth 3 (three) times daily as needed for anxiety or sleep.     Marland Kitchen amLODipine (NORVASC) 10 MG tablet Take 0.5 tablets (5 mg total) by mouth daily. 15 tablet 12  . cinacalcet (SENSIPAR) 30 MG tablet Take 30 mg by mouth daily.    . diphenhydrAMINE (BENADRYL) 25 mg capsule Take 25 mg by mouth every 6 (six) hours as needed for allergies.    . Insulin Glargine (LANTUS SOLOSTAR) 100 UNIT/ML Solostar Pen Inject 10 Units into the skin daily at 10 pm. 15 mL 11  . metoprolol (LOPRESSOR) 50 MG tablet Take 0.5 tablets (25 mg total) by mouth 2 (two) times daily. 30 tablet 12  . multivitamin (RENA-VIT) TABS tablet Take 1 tablet by mouth daily.    . Oxycodone HCl 10 MG TABS Take 1 tablet (10 mg  total) by mouth 3 (three) times daily as needed (pain). (Patient taking differently: Take 10 mg by mouth 4 (four) times daily as needed (pain). ) 15 tablet 0  . sevelamer carbonate (RENVELA) 800 MG tablet Take 1,600-3,200 mg by mouth See admin instructions. Take 3200 mg by mouth 3 times daily with meals and take 1600 mg by mouth with snacks.    . doxycycline (VIBRAMYCIN) 50 MG capsule Take 2 capsules (100 mg total) by mouth 2 (two) times daily. (Patient not taking: Reported on 01/18/2017) 20 capsule 0  . predniSONE (STERAPRED UNI-PAK 21 TAB) 10 MG (21) TBPK tablet Take by package instructions (Patient not taking: Reported on 01/18/2017) 21 tablet 0       TDD:UKGUR from the symptoms mentioned above,there are no other symptoms referable to all systems reviewed.  . insulin aspart  0-9 Units Subcutaneous TID WC  . levofloxacin (LEVAQUIN) IV  750 mg Intravenous Once   Followed by  . [START ON 01/20/2017] levofloxacin (LEVAQUIN) IV  500 mg Intravenous Q48H  . mouth rinse  15 mL Mouth Rinse BID  . oseltamivir  30 mg Oral QHS        Physical Exam: Vital signs in last 24 hours: Temp:  [98.1 F (36.7 C)-103.5 F (39.7 C)] 100.5 F (38.1 C) (01/31 0757) Pulse Rate:  [84-123] 95 (01/31 0815) Resp:  [16-35] 23 (01/31 0815) BP: (78-175)/(33-84) 95/56 (01/31 0815) SpO2:  [94 %-100 %] 94 % (01/31 0815) Weight:  [304 lb (137.9 kg)-315 lb 14.7 oz (143.3 kg)] 315 lb 14.7 oz (143.3 kg) (01/30 2209) Weight change:  Last BM Date: 01/17/17  Intake/Output from previous day: 01/30 0701 - 01/31 0700 In: 1070 [I.V.:470; IV Piggyback:600] Out: 200 [Urine:200] No intake/output data recorded.   Physical Exam: General- pt is awake,alert, oriented to time place and person Resp- No acute REsp distress,decreased at bases. CVS- S1S2 regular in rate and rhythm GIT- BS+, soft, NT, ND, obese EXT-NO Cyanosis, Chronic ;ymphedma present , left leg erythema present CNS- CN 2-12 grossly intact. Moving all 4  extremities Psych- normal mood and affect Access-AVF+ bruit   Lab Results: CBC  Recent Labs  01/18/17 1534 01/19/17 0212  WBC 20.4* 9.5  HGB 12.1 9.6*  HCT 35.8* 29.1*  PLT 201 121*    BMET  Recent Labs  01/18/17 1534 01/19/17 0212  NA 137 135  K 3.8 4.4  CL 98* 104  CO2 25 18*  GLUCOSE 143* 193*  BUN 59* 58*  CREATININE 4.86*  4.84*  CALCIUM 8.8* 7.1*    MICRO Recent Results (from the past 240 hour(s))  Culture, blood (routine x 2)     Status: None (Preliminary result)   Collection Time: 01/18/17  3:41 PM  Result Value Ref Range Status   Specimen Description LEFT ANTECUBITAL  Final   Special Requests BOTTLES DRAWN AEROBIC AND ANAEROBIC 6CC EACH  Final   Culture  Setup Time   Final    MODERATE GRAM NEGATIVE RODS AEROBIC AND ANAEROBIC BOTTLES Gram Stain Report Called to,Read Back By and Verified With: HOWARD,C AT 0725 BY HUFFINES,S ON 01/19/17    Culture NO GROWTH < 24 HOURS  Final   Report Status PENDING  Incomplete  Culture, blood (routine x 2)     Status: None (Preliminary result)   Collection Time: 01/18/17  3:41 PM  Result Value Ref Range Status   Specimen Description BLOOD LEFT HAND  Final   Special Requests BOTTLES DRAWN AEROBIC AND ANAEROBIC 6CC EACH  Final   Culture  Setup Time   Final    MODERATE GRAM NEGATIVE RODS AEROBIC AND ANAEROBIC BOTTLES Gram Stain Report Called to,Read Back By and Verified With: HOWARD,C AT 0725 BY HUFFINES,S ON 01/19/17.    Culture NO GROWTH < 24 HOURS  Final   Report Status PENDING  Incomplete  MRSA PCR Screening     Status: None   Collection Time: 01/18/17 10:25 PM  Result Value Ref Range Status   MRSA by PCR NEGATIVE NEGATIVE Final    Comment:        The GeneXpert MRSA Assay (FDA approved for NASAL specimens only), is one component of a comprehensive MRSA colonization surveillance program. It is not intended to diagnose MRSA infection nor to guide or monitor treatment for MRSA infections.       Lab  Results  Component Value Date   PTH 127 (H) 11/29/2015   CALCIUM 7.1 (L) 01/19/2017   CAION 1.22 11/25/2016   PHOS 5.1 (H) 01/05/2017      Impression: 1)Renal  ESRD on HD                Pt is on Mon/Wed/Friday schedule                Pt was last dialyzed Monday.                Will dialyze today.   2)HTN BP at goal  3)Anemia IN ESRD the target HGb goal  Is 9--11. hgb at goal Will keep on epo  4)CKD Mineral-Bone Disorder Secondary Hyperparathyroidism  present      On sensipar Phosphorus  at goal.      On binders  5)ID-admitted with sepsis/ cellulitis Primary MD following  6)Elecvtrolytes  Normokalemic  normonatremic   7)Acid base Co2 not at goal Sec to ESRD  8) Morbid obesity-Pt is actively trying to loose weight.      Encouraged pt to continue to do the same.   Plan:  Agree with current tx and plan Will dialyze today Will keep on 2 k bath Will  Keep on epo     Tatum Corl S 01/19/2017, 9:13 AM

## 2017-01-19 NOTE — Progress Notes (Signed)
Subjective: She was admitted yesterday with cellulitis of her left leg. She has chronic lymphedema of the left leg and has had previous bouts of cellulitis. She complains of pain in the leg. She otherwise says she feels okay. No chest pain. She does have some nausea. No vomiting. At baseline she has end-stage renal disease on dialysis. She has some element of sepsis from this was elevated lactate and low blood pressure elevated pulse rate and very high temperature.  Objective: Vital signs in last 24 hours: Temp:  [98.1 F (36.7 C)-103.5 F (39.7 C)] 98.1 F (36.7 C) (01/31 0400) Pulse Rate:  [84-123] 102 (01/31 0645) Resp:  [16-35] 29 (01/31 0645) BP: (78-175)/(50-84) 105/62 (01/31 0645) SpO2:  [96 %-100 %] 99 % (01/31 0645) Weight:  [137.9 kg (304 lb)-143.3 kg (315 lb 14.7 oz)] 143.3 kg (315 lb 14.7 oz) (01/30 2209) Weight change:  Last BM Date: 01/17/17  Intake/Output from previous day: 01/30 0701 - 01/31 0700 In: 1070 [I.V.:470; IV Piggyback:600] Out: 200 [Urine:200]  PHYSICAL EXAM General appearance: alert, cooperative, mild distress and morbidly obese Resp: clear to auscultation bilaterally Cardio: regular rate and rhythm, S1, S2 normal, no murmur, click, rub or gallop GI: soft, non-tender; bowel sounds normal; no masses,  no organomegaly Extremities: She has chronic lymphedema of both legs. The left leg is erythematous and tender mostly below the knee with a little above the knee. Otherwise her skin is warm and dry. Mucous membranes are moist.  Lab Results:  Results for orders placed or performed during the hospital encounter of 01/18/17 (from the past 48 hour(s))  Influenza panel by PCR (type A & B)     Status: None   Collection Time: 01/18/17  3:34 PM  Result Value Ref Range   Influenza A By PCR NEGATIVE NEGATIVE   Influenza B By PCR NEGATIVE NEGATIVE    Comment: (NOTE) The Xpert Xpress Flu assay is intended as an aid in the diagnosis of  influenza and should not be  used as a sole basis for treatment.  This  assay is FDA approved for nasopharyngeal swab specimens only. Nasal  washings and aspirates are unacceptable for Xpert Xpress Flu testing.   Comprehensive metabolic panel     Status: Abnormal   Collection Time: 01/18/17  3:34 PM  Result Value Ref Range   Sodium 137 135 - 145 mmol/[name]   Potassium 3.8 3.5 - 5.1 mmol/[name]   Chloride 98 ([name]) 101 - 111 mmol/[name]   CO2 25 22 - 32 mmol/[name]   Glucose, Bld 143 (H) 65 - 99 mg/dL   BUN 59 (H) 6 - 20 mg/dL   Creatinine, Ser 4.86 (H) 0.44 - 1.00 mg/dL   Calcium 8.8 ([name]) 8.9 - 10.3 mg/dL   Total Protein 7.5 6.5 - 8.1 g/dL   Albumin 3.8 3.5 - 5.0 g/dL   AST 19 15 - 41 U/[name]   ALT 20 14 - 54 U/[name]   Alkaline Phosphatase 112 38 - 126 U/[name]   Total Bilirubin 0.6 0.3 - 1.2 mg/dL   GFR calc non Af Amer 10 ([name]) >60 mL/min   GFR calc Af Amer 11 ([name]) >60 mL/min    Comment: (NOTE) The eGFR has been calculated using the CKD EPI equation. This calculation has not been validated in all clinical situations. eGFR's persistently <60 mL/min signify possible Chronic Kidney Disease.    Anion gap 14 5 - 15  Lipase, blood     Status: Abnormal   Collection Time: 01/18/17  3:34  PM  Result Value Ref Range   Lipase 75 (H) 11 - 51 U/[name]  CBC with Differential     Status: Abnormal   Collection Time: 01/18/17  3:34 PM  Result Value Ref Range   WBC 20.4 (H) 4.0 - 10.5 K/uL   RBC 3.75 ([name]) 3.87 - 5.11 MIL/uL   Hemoglobin 12.1 12.0 - 15.0 g/dL   HCT 35.8 ([name]) 36.0 - 46.0 %   MCV 95.5 78.0 - 100.0 fL   MCH 32.3 26.0 - 34.0 pg   MCHC 33.8 30.0 - 36.0 g/dL   RDW 14.6 11.5 - 15.5 %   Platelets 201 150 - 400 K/uL   Neutrophils Relative % 92 %   Neutro Abs 18.7 (H) 1.7 - 7.7 K/uL   Lymphocytes Relative 4 %   Lymphs Abs 0.8 0.7 - 4.0 K/uL   Monocytes Relative 3 %   Monocytes Absolute 0.6 0.1 - 1.0 K/uL   Eosinophils Relative 1 %   Eosinophils Absolute 0.2 0.0 - 0.7 K/uL   Basophils Relative 0 %   Basophils Absolute 0.0 0.0 - 0.1 K/uL  Troponin  I     Status: None   Collection Time: 01/18/17  3:34 PM  Result Value Ref Range   Troponin I <0.03 <0.03 ng/mL  Lactic acid, plasma     Status: Abnormal   Collection Time: 01/18/17  3:41 PM  Result Value Ref Range   Lactic Acid, Venous 2.3 (HH) 0.5 - 1.9 mmol/[name]    Comment: CRITICAL RESULT CALLED TO, READ BACK BY AND VERIFIED WITH: MITCHELL,M ON 01/18/17 AT 1735 BY LOY,C   Culture, blood (routine x 2)     Status: None (Preliminary result)   Collection Time: 01/18/17  3:41 PM  Result Value Ref Range   Specimen Description LEFT ANTECUBITAL    Special Requests BOTTLES DRAWN AEROBIC AND ANAEROBIC 6CC EACH    Culture PENDING    Report Status PENDING   Culture, blood (routine x 2)     Status: None (Preliminary result)   Collection Time: 01/18/17  3:41 PM  Result Value Ref Range   Specimen Description BLOOD LEFT HAND    Special Requests BOTTLES DRAWN AEROBIC AND ANAEROBIC [facility] EACH    Culture PENDING    Report Status PENDING   Lactic acid, plasma     Status: Abnormal   Collection Time: 01/18/17  6:12 PM  Result Value Ref Range   Lactic Acid, Venous 4.0 (HH) 0.5 - 1.9 mmol/[name]    Comment: CRITICAL RESULT CALLED TO, READ BACK BY AND VERIFIED WITH: MITCHELL,M ON 01/18/17 AT 1950 BY LOY,C   Lactic acid, plasma     Status: Abnormal   Collection Time: 01/18/17 10:14 PM  Result Value Ref Range   Lactic Acid, Venous 3.2 (HH) 0.5 - 1.9 mmol/[name]    Comment: CRITICAL RESULT CALLED TO, READ BACK BY AND VERIFIED WITH: FREEMAN [name] AT 2327 ON [id number] BY FORSYTH K   Procalcitonin     Status: None   Collection Time: 01/18/17 10:14 PM  Result Value Ref Range   Procalcitonin 0.26 ng/mL    Comment:        Interpretation: PCT (Procalcitonin) <= 0.5 ng/mL: Systemic infection (sepsis) is not likely. Local bacterial infection is possible. (NOTE)         ICU PCT Algorithm               Non ICU PCT Algorithm    ----------------------------     ------------------------------  PCT < 0.25 ng/mL                  PCT < 0.1 ng/mL     Stopping of antibiotics            Stopping of antibiotics       strongly encouraged.               strongly encouraged.    ----------------------------     ------------------------------       PCT level decrease by               PCT < 0.25 ng/mL       >= 80% from peak PCT       OR PCT 0.25 - 0.5 ng/mL          Stopping of antibiotics                                             encouraged.     Stopping of antibiotics           encouraged.    ----------------------------     ------------------------------       PCT level decrease by              PCT >= 0.25 ng/mL       < 80% from peak PCT        AND PCT >= 0.5 ng/mL            Continuin g antibiotics                                              encouraged.       Continuing antibiotics            encouraged.    ----------------------------     ------------------------------     PCT level increase compared          PCT > 0.5 ng/mL         with peak PCT AND          PCT >= 0.5 ng/mL             Escalation of antibiotics                                          strongly encouraged.      Escalation of antibiotics        strongly encouraged.   Protime-INR     Status: None   Collection Time: 01/18/17 10:14 PM  Result Value Ref Range   Prothrombin Time 13.5 11.4 - 15.2 seconds   INR 1.03   APTT     Status: None   Collection Time: 01/18/17 10:14 PM  Result Value Ref Range   aPTT 28 24 - 36 seconds  Glucose, capillary     Status: Abnormal   Collection Time: 01/18/17 10:19 PM  Result Value Ref Range   Glucose-Capillary 210 (H) 65 - 99 mg/dL   Comment 1 Notify RN   MRSA PCR Screening     Status: None   Collection Time: 01/18/17 10:25 PM  Result Value Ref Range   MRSA by  PCR NEGATIVE NEGATIVE    Comment:        The GeneXpert MRSA Assay (FDA approved for NASAL specimens only), is one component of a comprehensive MRSA colonization surveillance program. It is not intended to diagnose MRSA infection nor to  guide or monitor treatment for MRSA infections.   Lactic acid, plasma     Status: Abnormal   Collection Time: 01/19/17  2:12 AM  Result Value Ref Range   Lactic Acid, Venous 3.4 (HH) 0.5 - 1.9 mmol/[name]    Comment: CRITICAL RESULT CALLED TO, READ BACK BY AND VERIFIED WITH: Bethann Humble AT 0250 ON [id number] BY FORSYTH K CORRECTION CRITICAL RESULT CALLED TO, READ BACK BY AND VERIFIED WITH: Bethann Humble AT 2952 ON [id number] BY FORSYTH K   Basic metabolic panel     Status: Abnormal   Collection Time: 01/19/17  2:12 AM  Result Value Ref Range   Sodium 135 135 - 145 mmol/[name]   Potassium 4.4 3.5 - 5.1 mmol/[name]   Chloride 104 101 - 111 mmol/[name]   CO2 18 ([name]) 22 - 32 mmol/[name]   Glucose, Bld 193 (H) 65 - 99 mg/dL   BUN 58 (H) 6 - 20 mg/dL   Creatinine, Ser 4.84 (H) 0.44 - 1.00 mg/dL   Calcium 7.1 ([name]) 8.9 - 10.3 mg/dL   GFR calc non Af Amer 10 ([name]) >60 mL/min   GFR calc Af Amer 11 ([name]) >60 mL/min    Comment: (NOTE) The eGFR has been calculated using the CKD EPI equation. This calculation has not been validated in all clinical situations. eGFR's persistently <60 mL/min signify possible Chronic Kidney Disease.    Anion gap 13 5 - 15  CBC     Status: Abnormal   Collection Time: 01/19/17  2:12 AM  Result Value Ref Range   WBC 9.5 4.0 - 10.5 K/uL   RBC 3.02 ([name]) 3.87 - 5.11 MIL/uL   Hemoglobin 9.6 ([name]) 12.0 - 15.0 g/dL    Comment: DELTA CHECK NOTED   HCT 29.1 ([name]) 36.0 - 46.0 %   MCV 96.4 78.0 - 100.0 fL   MCH 31.8 26.0 - 34.0 pg   MCHC 33.0 30.0 - 36.0 g/dL   RDW 14.8 11.5 - 15.5 %   Platelets 121 ([name]) 150 - 400 K/uL  Lactic acid, plasma     Status: Abnormal   Collection Time: 01/19/17  5:47 AM  Result Value Ref Range   Lactic Acid, Venous 2.4 (HH) 0.5 - 1.9 mmol/[name]    Comment: CRITICAL RESULT CALLED TO, READ BACK BY AND VERIFIED WITH: SMITH,J AT 6:20AM ON 01/19/17 BY FESTERMAN,C     ABGS No results for input(s): PHART, PO2ART, TCO2, HCO3 in the last 72 hours.  Invalid input(s): PCO2 CULTURES Recent  Results (from the past 240 hour(s))  Culture, blood (routine x 2)     Status: None (Preliminary result)   Collection Time: 01/18/17  3:41 PM  Result Value Ref Range Status   Specimen Description LEFT ANTECUBITAL  Final   Special Requests BOTTLES DRAWN AEROBIC AND ANAEROBIC [facility] EACH  Final   Culture PENDING  Incomplete   Report Status PENDING  Incomplete  Culture, blood (routine x 2)     Status: None (Preliminary result)   Collection Time: 01/18/17  3:41 PM  Result Value Ref Range Status   Specimen Description BLOOD LEFT HAND  Final   Special Requests BOTTLES DRAWN AEROBIC AND ANAEROBIC [place]  Final   Culture PENDING  Incomplete   Report  Status PENDING  Incomplete  MRSA PCR Screening     Status: None   Collection Time: 01/18/17 10:25 PM  Result Value Ref Range Status   MRSA by PCR NEGATIVE NEGATIVE Final    Comment:        The GeneXpert MRSA Assay (FDA approved for NASAL specimens only), is one component of a comprehensive MRSA colonization surveillance program. It is not intended to diagnose MRSA infection nor to guide or monitor treatment for MRSA infections.    Studies/Results: Dg Chest Port 1 View  Result Date: 01/18/2017 CLINICAL DATA:  Short of breath, cough, fever and chills today. Pneumonia 2 weeks ago. EXAM: PORTABLE CHEST 1 VIEW COMPARISON:  12/31/2016. FINDINGS: Mild enlargement of the cardiac silhouette, stable. No mediastinal or hilar masses. No convincing adenopathy. Mild vascular congestion. No convincing pulmonary edema. No evidence of pneumonia. No convincing pleural effusion.  No pneumothorax. Skeletal structures are intact. IMPRESSION: No convincing acute cardiopulmonary disease. No definitive pneumonia or pulmonary edema. Electronically Signed   By: Lajean Manes M.D.   On: 01/18/2017 16:02    Medications:  Prior to Admission:  Prescriptions Prior to Admission  Medication Sig Dispense Refill Last Dose  . acetaminophen (TYLENOL) 325 MG tablet Take 650 mg  by mouth every 6 (six) hours as needed for moderate pain or fever.    01/17/2017 at Unknown time  . albuterol (PROVENTIL) (2.5 MG/3ML) 0.083% nebulizer solution Take 2.5 mg by nebulization every 6 (six) hours as needed for wheezing or shortness of breath.   Past Month at Unknown time  . ALPRAZolam (XANAX) 0.5 MG tablet Take 0.5 mg by mouth 3 (three) times daily as needed for anxiety or sleep.    01/17/2017 at Unknown time  . amLODipine (NORVASC) 10 MG tablet Take 0.5 tablets (5 mg total) by mouth daily. 15 tablet 12 01/17/2017 at Unknown time  . cinacalcet (SENSIPAR) 30 MG tablet Take 30 mg by mouth daily.   01/17/2017 at Unknown time  . diphenhydrAMINE (BENADRYL) 25 mg capsule Take 25 mg by mouth every 6 (six) hours as needed for allergies.   unknown  . Insulin Glargine (LANTUS SOLOSTAR) 100 UNIT/ML Solostar Pen Inject 10 Units into the skin daily at 10 pm. 15 mL 11 01/17/2017 at Unknown time  . metoprolol (LOPRESSOR) 50 MG tablet Take 0.5 tablets (25 mg total) by mouth 2 (two) times daily. 30 tablet 12 01/17/2017 at Unknown time  . multivitamin (RENA-VIT) TABS tablet Take 1 tablet by mouth daily.   01/17/2017 at Unknown time  . Oxycodone HCl 10 MG TABS Take 1 tablet (10 mg total) by mouth 3 (three) times daily as needed (pain). (Patient taking differently: Take 10 mg by mouth 4 (four) times daily as needed (pain). ) 15 tablet 0 unknown  . sevelamer carbonate (RENVELA) 800 MG tablet Take 1,600-3,200 mg by mouth See admin instructions. Take 3200 mg by mouth 3 times daily with meals and take 1600 mg by mouth with snacks.   01/17/2017 at Unknown time  . doxycycline (VIBRAMYCIN) 50 MG capsule Take 2 capsules (100 mg total) by mouth 2 (two) times daily. (Patient not taking: Reported on 01/18/2017) 20 capsule 0 Not Taking at Unknown time  . predniSONE (STERAPRED UNI-PAK 21 TAB) 10 MG (21) TBPK tablet Take by package instructions (Patient not taking: Reported on 01/18/2017) 21 tablet 0 Not Taking at Unknown time    Scheduled: . insulin aspart  0-9 Units Subcutaneous TID WC  . levalbuterol      . levofloxacin (  LEVAQUIN) IV  750 mg Intravenous Once   Followed by  . [START ON 01/20/2017] levofloxacin (LEVAQUIN) IV  500 mg Intravenous Q48H  . mouth rinse  15 mL Mouth Rinse BID  . oseltamivir  30 mg Oral QHS   Continuous:  UGG:[id number], ALPRAZolam, levalbuterol, ondansetron **OR** ondansetron (ZOFRAN) IV, oxyCODONE  Assesment: She was admitted with what appears to be cellulitis of her left leg. She has evidence of sepsis. She is better after fluid bolus. She does have chronic diastolic heart failure that is managed with dialysis. She has morbid obesity. She has obesity hypoventilation. She is on IV antibiotics. Repeat lactate level has been ordered and is due in about 2 hours. Principal Problem:   Fever Active Problems:   Diabetes mellitus ([place])   Morbid obesity ([place])   Obesity hypoventilation syndrome ([facility])   Chronic diastolic CHF (congestive heart failure) (H[facility])   Chronic respiratory failure with hypoxia ([facility])   ESRD on dialysis ([place])   Flu-like symptoms    Plan: Continue IV antibiotics. She seems to be better as far as sepsis is concerned. Recheck lactate. She has very poor venous access so I'm going to ask for PICC line to be placed as she may require prolonged IV antibiotics.    LOS: 1 day   [name] 01/19/2017, 7:10 AM

## 2017-01-19 NOTE — Progress Notes (Signed)
PHARMACY - PHYSICIAN COMMUNICATION CRITICAL VALUE ALERT - BLOOD CULTURE IDENTIFICATION (BCID)  Culture, blood (routine x 2)  Order: [id number]  Status:  Preliminary result Visible to patient:  No (Not Released) Next appt:  None   1d ago  Specimen Description LEFT ANTECUBITAL   Special Requests BOTTLES DRAWN AEROBIC AND ANAEROBIC 6CC EACH   Culture Setup Time MODERATE GRAM NEGATIVE RODS AEROBIC AND ANAEROBIC BOTTLES  Gram Stain Report Called to,Read Back By and Verified With: HOWARD,C AT 0725 BY HUFFINES,S ON 01/19/17      Culture NO GROWTH < 24 HOURS   Report Status PENDING        Name of physician (or Provider) Contacted: Dr [name]  Changes to prescribed antibiotics required: Continue Levaquin until organism isolated, ID and Sens known.  Hart Robinsons A 01/19/2017  12:43 PM

## 2017-01-20 ENCOUNTER — Inpatient Hospital Stay (HOSPITAL_COMMUNITY): Payer: Medicare Other

## 2017-01-20 DIAGNOSIS — I5032 Chronic diastolic (congestive) heart failure: Secondary | ICD-10-CM

## 2017-01-20 DIAGNOSIS — N186 End stage renal disease: Secondary | ICD-10-CM

## 2017-01-20 DIAGNOSIS — J9611 Chronic respiratory failure with hypoxia: Secondary | ICD-10-CM

## 2017-01-20 DIAGNOSIS — I248 Other forms of acute ischemic heart disease: Secondary | ICD-10-CM

## 2017-01-20 DIAGNOSIS — Z992 Dependence on renal dialysis: Secondary | ICD-10-CM

## 2017-01-20 DIAGNOSIS — L0390 Cellulitis, unspecified: Secondary | ICD-10-CM

## 2017-01-20 LAB — CBC WITH DIFFERENTIAL/PLATELET
BASOS ABS: 0 10*3/uL (ref 0.0–0.1)
BASOS PCT: 0 %
EOS ABS: 0 10*3/uL (ref 0.0–0.7)
Eosinophils Relative: 0 %
HCT: 29.9 % — ABNORMAL LOW (ref 36.0–46.0)
Hemoglobin: 9.9 g/dL — ABNORMAL LOW (ref 12.0–15.0)
Lymphocytes Relative: 6 %
Lymphs Abs: 0.8 10*3/uL (ref 0.7–4.0)
MCH: 32.1 pg (ref 26.0–34.0)
MCHC: 33.1 g/dL (ref 30.0–36.0)
MCV: 97.1 fL (ref 78.0–100.0)
MONO ABS: 0.8 10*3/uL (ref 0.1–1.0)
MONOS PCT: 6 %
NEUTROS PCT: 87 %
Neutro Abs: 11.1 10*3/uL — ABNORMAL HIGH (ref 1.7–7.7)
Platelets: 104 10*3/uL — ABNORMAL LOW (ref 150–400)
RBC: 3.08 MIL/uL — ABNORMAL LOW (ref 3.87–5.11)
RDW: 15.1 % (ref 11.5–15.5)
WBC: 12.7 10*3/uL — ABNORMAL HIGH (ref 4.0–10.5)

## 2017-01-20 LAB — TROPONIN I
TROPONIN I: 0.21 ng/mL — AB (ref <0.03)
TROPONIN I: 0.97 ng/mL — AB (ref <0.03)
Troponin I: 3.57 ng/mL (ref <0.03)

## 2017-01-20 LAB — LACTIC ACID, PLASMA
LACTIC ACID, VENOUS: 6.3 mmol/L — AB (ref 0.5–1.9)
LACTIC ACID, VENOUS: 8.2 mmol/L — AB (ref 0.5–1.9)
LACTIC ACID, VENOUS: 6.9 mmol/L — AB (ref 0.5–1.9)

## 2017-01-20 LAB — GLUCOSE, CAPILLARY
GLUCOSE-CAPILLARY: 126 mg/dL — AB (ref 65–99)
Glucose-Capillary: 107 mg/dL — ABNORMAL HIGH (ref 65–99)
Glucose-Capillary: 123 mg/dL — ABNORMAL HIGH (ref 65–99)
Glucose-Capillary: 146 mg/dL — ABNORMAL HIGH (ref 65–99)

## 2017-01-20 LAB — HEPATITIS B SURFACE ANTIBODY, QUANTITATIVE: Hep B S AB Quant (Post): <3.1 m[IU]/mL — ABNORMAL LOW (ref >9.9)

## 2017-01-20 LAB — HEPATITIS B SURFACE ANTIGEN: Hepatitis B Surface Ag: NEGATIVE

## 2017-01-20 LAB — HEPATITIS B CORE ANTIBODY, TOTAL: Hep B Core Total Ab: NEGATIVE

## 2017-01-20 MED ORDER — PHENYLEPHRINE HCL 10 MG/ML IJ SOLN
0.0000 ug/min | INTRAMUSCULAR | Status: DC
  Administered 2017-01-20: 20 ug/min via INTRAVENOUS
  Administered 2017-01-20: 180 ug/min via INTRAVENOUS
  Filled 2017-01-20: qty 1

## 2017-01-20 MED ORDER — PHENYLEPHRINE HCL 10 MG/ML IJ SOLN
INTRAMUSCULAR | Status: AC
  Filled 2017-01-20: qty 1

## 2017-01-20 MED ORDER — SODIUM CHLORIDE 0.9 % IV SOLN
0.0000 ug/min | INTRAVENOUS | Status: DC
  Administered 2017-01-20 (×2): 190 ug/min via INTRAVENOUS
  Administered 2017-01-20: 150 ug/min via INTRAVENOUS
  Administered 2017-01-20: 190 ug/min via INTRAVENOUS
  Administered 2017-01-21 (×3): 110 ug/min via INTRAVENOUS
  Administered 2017-01-22: 80 ug/min via INTRAVENOUS
  Administered 2017-01-23 (×2): 75 ug/min via INTRAVENOUS
  Administered 2017-01-24: 45 ug/min via INTRAVENOUS
  Administered 2017-01-24: 75 ug/min via INTRAVENOUS
  Administered 2017-01-25: 13 ug/min via INTRAVENOUS
  Filled 2017-01-20 (×14): qty 4

## 2017-01-20 MED ORDER — SODIUM CHLORIDE 0.9 % IV BOLUS (SEPSIS)
1000.0000 mL | Freq: Once | INTRAVENOUS | Status: AC
  Administered 2017-01-20: 1000 mL via INTRAVENOUS

## 2017-01-20 MED ORDER — DEXTROSE 5 % IV SOLN
2.0000 g | INTRAVENOUS | Status: DC
  Administered 2017-01-21 – 2017-01-28 (×4): 2 g via INTRAVENOUS
  Filled 2017-01-20 (×5): qty 2

## 2017-01-20 MED ORDER — DEXTROSE 5 % IV SOLN
2.0000 g | Freq: Once | INTRAVENOUS | Status: AC
  Administered 2017-01-20: 2 g via INTRAVENOUS
  Filled 2017-01-20: qty 2

## 2017-01-20 MED ORDER — DEXTROSE 5 % IV SOLN: 1.0000 g | INTRAVENOUS | Status: DC

## 2017-01-20 NOTE — Progress Notes (Signed)
Subjective: She's had low blood pressure through the night. She had more fever. She is growing gram-negative rods in her blood. She says she doesn't feel well. She has pain in her neck were her IJ line was placed. No other new complaints. She says her breathing is pretty good  Objective: Vital signs in last 24 hours: Temp:  [97.8 F (36.6 C)-100.5 F (38.1 C)] 97.8 F (36.6 C) (02/01 0754) Pulse Rate:  [89-122] 95 (02/01 0815) Resp:  [14-30] 14 (02/01 0815) BP: (64-150)/(35-94) 94/46 (02/01 0815) SpO2:  [92 %-100 %] 99 % (02/01 0815) Weight:  [142.3 kg (313 lb 11.4 oz)-143.3 kg (315 lb 14.7 oz)] 142.3 kg (313 lb 11.4 oz) (02/01 0500) Weight change: 5.407 kg (11 lb 14.7 oz) Last BM Date: 01/18/17  Intake/Output from previous day: 01/31 0701 - 02/01 0700 In: -  Out: 2900   PHYSICAL EXAM General appearance: alert, cooperative and moderate distress Resp: rhonchi bilaterally Cardio: regular rate and rhythm, S1, S2 normal, no murmur, click, rub or gallop GI: soft, non-tender; bowel sounds normal; no masses,  no organomegaly Extremities: She has chronic lymphedema of her legs. Her cellulitis seems a little better Pupils react. EOMI. Mucous membranes are moist  Lab Results:  Results for orders placed or performed during the hospital encounter of 01/18/17 (from the past 48 hour(s))  Influenza panel by PCR (type A & B)     Status: None   Collection Time: 01/18/17  3:34 PM  Result Value Ref Range   Influenza A By PCR NEGATIVE NEGATIVE   Influenza B By PCR NEGATIVE NEGATIVE    Comment: (NOTE) The Xpert Xpress Flu assay is intended as an aid in the diagnosis of  influenza and should not be used as a sole basis for treatment.  This  assay is FDA approved for nasopharyngeal swab specimens only. Nasal  washings and aspirates are unacceptable for Xpert Xpress Flu testing.   Comprehensive metabolic panel     Status: Abnormal   Collection Time: 01/18/17  3:34 PM  Result Value Ref Range    Sodium 137 135 - 145 mmol/L   Potassium 3.8 3.5 - 5.1 mmol/L   Chloride 98 (L) 101 - 111 mmol/L   CO2 25 22 - 32 mmol/L   Glucose, Bld 143 (H) 65 - 99 mg/dL   BUN 59 (H) 6 - 20 mg/dL   Creatinine, Ser 4.86 (H) 0.44 - 1.00 mg/dL   Calcium 8.8 (L) 8.9 - 10.3 mg/dL   Total Protein 7.5 6.5 - 8.1 g/dL   Albumin 3.8 3.5 - 5.0 g/dL   AST 19 15 - 41 U/L   ALT 20 14 - 54 U/L   Alkaline Phosphatase 112 38 - 126 U/L   Total Bilirubin 0.6 0.3 - 1.2 mg/dL   GFR calc non Af Amer 10 (L) >60 mL/min   GFR calc Af Amer 11 (L) >60 mL/min    Comment: (NOTE) The eGFR has been calculated using the CKD EPI equation. This calculation has not been validated in all clinical situations. eGFR's persistently <60 mL/min signify possible Chronic Kidney Disease.    Anion gap 14 5 - 15  Lipase, blood     Status: Abnormal   Collection Time: 01/18/17  3:34 PM  Result Value Ref Range   Lipase 75 (H) 11 - 51 U/L  CBC with Differential     Status: Abnormal   Collection Time: 01/18/17  3:34 PM  Result Value Ref Range   WBC 20.4 (H) 4.0 -  10.5 K/uL   RBC 3.75 (L) 3.87 - 5.11 MIL/uL   Hemoglobin 12.1 12.0 - 15.0 g/dL   HCT 35.8 (L) 36.0 - 46.0 %   MCV 95.5 78.0 - 100.0 fL   MCH 32.3 26.0 - 34.0 pg   MCHC 33.8 30.0 - 36.0 g/dL   RDW 14.6 11.5 - 15.5 %   Platelets 201 150 - 400 K/uL   Neutrophils Relative % 92 %   Neutro Abs 18.7 (H) 1.7 - 7.7 K/uL   Lymphocytes Relative 4 %   Lymphs Abs 0.8 0.7 - 4.0 K/uL   Monocytes Relative 3 %   Monocytes Absolute 0.6 0.1 - 1.0 K/uL   Eosinophils Relative 1 %   Eosinophils Absolute 0.2 0.0 - 0.7 K/uL   Basophils Relative 0 %   Basophils Absolute 0.0 0.0 - 0.1 K/uL  Troponin I     Status: None   Collection Time: 01/18/17  3:34 PM  Result Value Ref Range   Troponin I <0.03 <0.03 ng/mL  Lactic acid, plasma     Status: Abnormal   Collection Time: 01/18/17  3:41 PM  Result Value Ref Range   Lactic Acid, Venous 2.3 (HH) 0.5 - 1.9 mmol/L    Comment: CRITICAL RESULT CALLED  TO, READ BACK BY AND VERIFIED WITH: MITCHELL,M ON 01/18/17 AT 1735 BY LOY,C   Culture, blood (routine x 2)     Status: None (Preliminary result)   Collection Time: 01/18/17  3:41 PM  Result Value Ref Range   Specimen Description LEFT ANTECUBITAL    Special Requests BOTTLES DRAWN AEROBIC AND ANAEROBIC 6CC EACH    Culture  Setup Time      GRAM NEGATIVE RODS AEROBIC AND ANAEROBIC BOTTLES Gram Stain Report Called to,Read Back By and Verified With: HOWARD,C AT 0725 BY HUFFINES,S ON 01/19/17 Performed at [facility], [address], [place], [place] [address]    Culture NO GROWTH 2 DAYS    Report Status PENDING   Culture, blood (routine x 2)     Status: None (Preliminary result)   Collection Time: 01/18/17  3:41 PM  Result Value Ref Range   Specimen Description BLOOD LEFT HAND    Special Requests BOTTLES DRAWN AEROBIC AND ANAEROBIC 6CC EACH    Culture  Setup Time      GRAM NEGATIVE RODS AEROBIC AND ANAEROBIC BOTTLES Gram Stain Report Called to,Read Back By and Verified With: HOWARD,C AT 0725 BY HUFFINES,S ON 01/19/17. Organism ID to follow CRITICAL RESULT CALLED TO, READ BACK BY AND VERIFIED WITHJuel Burrow PHARMD [id number] 01/19/17 A BROWNING Performed at [facility], [place] [address], [place], [place] [address]    Culture NO GROWTH 2 DAYS    Report Status PENDING   Blood Culture ID Panel (Reflexed)     Status: Abnormal   Collection Time: 01/18/17  3:41 PM  Result Value Ref Range   Enterococcus species NOT DETECTED NOT DETECTED   Listeria monocytogenes NOT DETECTED NOT DETECTED   Staphylococcus species NOT DETECTED NOT DETECTED   Staphylococcus aureus NOT DETECTED NOT DETECTED   Streptococcus species NOT DETECTED NOT DETECTED   Streptococcus agalactiae NOT DETECTED NOT DETECTED   Streptococcus pneumoniae NOT DETECTED NOT DETECTED   Streptococcus pyogenes NOT DETECTED NOT DETECTED   Acinetobacter baumannii NOT DETECTED NOT DETECTED   Enterobacteriaceae species DETECTED (A) NOT  DETECTED    Comment: Enterobacteriaceae represent a large family of gram-negative bacteria, not a single organism. CRITICAL RESULT CALLED TO, READ BACK BY  AND VERIFIED WITH: S HALL PHARMD 1535 01/19/17 A BROWNING    Enterobacter cloacae complex NOT DETECTED NOT DETECTED   Escherichia coli NOT DETECTED NOT DETECTED   Klebsiella oxytoca NOT DETECTED NOT DETECTED   Klebsiella pneumoniae NOT DETECTED NOT DETECTED   Proteus species NOT DETECTED NOT DETECTED   Serratia marcescens DETECTED (A) NOT DETECTED    Comment: CRITICAL RESULT CALLED TO, READ BACK BY AND VERIFIED WITH: S HALL PHARMD 1535 01/19/17 A BROWNING    Carbapenem resistance NOT DETECTED NOT DETECTED   Haemophilus influenzae NOT DETECTED NOT DETECTED   Neisseria meningitidis NOT DETECTED NOT DETECTED   Pseudomonas aeruginosa NOT DETECTED NOT DETECTED   Candida albicans NOT DETECTED NOT DETECTED   Candida glabrata NOT DETECTED NOT DETECTED   Candida krusei NOT DETECTED NOT DETECTED   Candida parapsilosis NOT DETECTED NOT DETECTED   Candida tropicalis NOT DETECTED NOT DETECTED    Comment: Performed at [facility] [address] [place], [place] [address]  Lactic acid, plasma     Status: Abnormal   Collection Time: 01/18/17  6:12 PM  Result Value Ref Range   Lactic Acid, Venous 4.0 (HH) 0.5 - 1.9 mmol/L    Comment: CRITICAL RESULT CALLED TO, READ BACK BY AND VERIFIED WITH: MITCHELL,M ON 01/18/17 AT 1950 BY LOY,C   Lactic acid, plasma     Status: Abnormal   Collection Time: 01/18/17 10:14 PM  Result Value Ref Range   Lactic Acid, Venous 3.2 (HH) 0.5 - 1.9 mmol/L    Comment: CRITICAL RESULT CALLED TO, READ BACK BY AND VERIFIED WITH: FREEMAN L AT 2327 ON [id number] BY FORSYTH K   Procalcitonin     Status: None   Collection Time: 01/18/17 10:14 PM  Result Value Ref Range   Procalcitonin 0.26 ng/mL    Comment:        Interpretation: PCT (Procalcitonin) <= 0.5 ng/mL: Systemic infection (sepsis) is not likely. Local  bacterial infection is possible. (NOTE)         ICU PCT Algorithm               Non ICU PCT Algorithm    ----------------------------     ------------------------------         PCT < 0.25 ng/mL                 PCT < 0.1 ng/mL     Stopping of antibiotics            Stopping of antibiotics       strongly encouraged.               strongly encouraged.    ----------------------------     ------------------------------       PCT level decrease by               PCT < 0.25 ng/mL       >= 80% from peak PCT       OR PCT 0.25 - 0.5 ng/mL          Stopping of antibiotics                                             encouraged.     Stopping of antibiotics           encouraged.    ----------------------------     ------------------------------  PCT level decrease by              PCT >= 0.25 ng/mL       < 80% from peak PCT        AND PCT >= 0.5 ng/mL            Continuin g antibiotics                                              encouraged.       Continuing antibiotics            encouraged.    ----------------------------     ------------------------------     PCT level increase compared          PCT > 0.5 ng/mL         with peak PCT AND          PCT >= 0.5 ng/mL             Escalation of antibiotics                                          strongly encouraged.      Escalation of antibiotics        strongly encouraged.   Protime-INR     Status: None   Collection Time: 01/18/17 10:14 PM  Result Value Ref Range   Prothrombin Time 13.5 11.4 - 15.2 seconds   INR 1.03   APTT     Status: None   Collection Time: 01/18/17 10:14 PM  Result Value Ref Range   aPTT 28 24 - 36 seconds  Glucose, capillary     Status: Abnormal   Collection Time: 01/18/17 10:19 PM  Result Value Ref Range   Glucose-Capillary 210 (H) 65 - 99 mg/dL   Comment 1 Notify RN   MRSA PCR Screening     Status: None   Collection Time: 01/18/17 10:25 PM  Result Value Ref Range   MRSA by PCR NEGATIVE NEGATIVE    Comment:         The GeneXpert MRSA Assay (FDA approved for NASAL specimens only), is one component of a comprehensive MRSA colonization surveillance program. It is not intended to diagnose MRSA infection nor to guide or monitor treatment for MRSA infections.   Lactic acid, plasma     Status: Abnormal   Collection Time: 01/19/17  2:12 AM  Result Value Ref Range   Lactic Acid, Venous 3.4 (HH) 0.5 - 1.9 mmol/L    Comment: CRITICAL RESULT CALLED TO, READ BACK BY AND VERIFIED WITH: SMITH J AT 0250 ON [id number] BY FORSYTH K CORRECTION CRITICAL RESULT CALLED TO, READ BACK BY AND VERIFIED WITH: Bethann Humble AT 6160 ON [id number] BY FORSYTH K   Basic metabolic panel     Status: Abnormal   Collection Time: 01/19/17  2:12 AM  Result Value Ref Range   Sodium 135 135 - 145 mmol/L   Potassium 4.4 3.5 - 5.1 mmol/L   Chloride 104 101 - 111 mmol/L   CO2 18 (L) 22 - 32 mmol/L   Glucose, Bld 193 (H) 65 - 99 mg/dL   BUN 58 (H) 6 - 20 mg/dL   Creatinine, Ser 4.84 (H) 0.44 - 1.00 mg/dL   Calcium 7.1 (  L) 8.9 - 10.3 mg/dL   GFR calc non Af Amer 10 (L) >60 mL/min   GFR calc Af Amer 11 (L) >60 mL/min    Comment: (NOTE) The eGFR has been calculated using the CKD EPI equation. This calculation has not been validated in all clinical situations. eGFR's persistently <60 mL/min signify possible Chronic Kidney Disease.    Anion gap 13 5 - 15  CBC     Status: Abnormal   Collection Time: 01/19/17  2:12 AM  Result Value Ref Range   WBC 9.5 4.0 - 10.5 K/uL   RBC 3.02 (L) 3.87 - 5.11 MIL/uL   Hemoglobin 9.6 (L) 12.0 - 15.0 g/dL    Comment: DELTA CHECK NOTED   HCT 29.1 (L) 36.0 - 46.0 %   MCV 96.4 78.0 - 100.0 fL   MCH 31.8 26.0 - 34.0 pg   MCHC 33.0 30.0 - 36.0 g/dL   RDW 14.8 11.5 - 15.5 %   Platelets 121 (L) 150 - 400 K/uL  Lactic acid, plasma     Status: Abnormal   Collection Time: 01/19/17  5:47 AM  Result Value Ref Range   Lactic Acid, Venous 2.4 (HH) 0.5 - 1.9 mmol/L    Comment: CRITICAL RESULT CALLED TO, READ  BACK BY AND VERIFIED WITH: SMITH,J AT 6:20AM ON 01/19/17 BY FESTERMAN,C   Glucose, capillary     Status: Abnormal   Collection Time: 01/19/17  7:56 AM  Result Value Ref Range   Glucose-Capillary 224 (H) 65 - 99 mg/dL  Lactic acid, plasma     Status: Abnormal   Collection Time: 01/19/17  9:40 AM  Result Value Ref Range   Lactic Acid, Venous 2.9 (HH) 0.5 - 1.9 mmol/L    Comment: CRITICAL RESULT CALLED TO, READ BACK BY AND VERIFIED WITH: MOTLEY,J AT 10:20AM ON 01/19/17 BY FESTERMAN,C   CBC with Differential/Platelet     Status: Abnormal   Collection Time: 01/19/17  9:40 AM  Result Value Ref Range   WBC 11.9 (H) 4.0 - 10.5 K/uL    Comment: INCREASED BANDS (>20% BANDS)   RBC 3.04 (L) 3.87 - 5.11 MIL/uL   Hemoglobin 9.6 (L) 12.0 - 15.0 g/dL   HCT 29.0 (L) 36.0 - 46.0 %   MCV 95.4 78.0 - 100.0 fL   MCH 31.6 26.0 - 34.0 pg   MCHC 33.1 30.0 - 36.0 g/dL   RDW 15.1 11.5 - 15.5 %   Platelets 146 (L) 150 - 400 K/uL   Neutrophils Relative % 91 %   Neutro Abs 10.9 (H) 1.7 - 7.7 K/uL   Lymphocytes Relative 6 %   Lymphs Abs 0.7 0.7 - 4.0 K/uL   Monocytes Relative 3 %   Monocytes Absolute 0.3 0.1 - 1.0 K/uL   Eosinophils Relative 0 %   Eosinophils Absolute 0.0 0.0 - 0.7 K/uL   Basophils Relative 0 %   Basophils Absolute 0.0 0.0 - 0.1 K/uL  Glucose, capillary     Status: Abnormal   Collection Time: 01/19/17 11:55 AM  Result Value Ref Range   Glucose-Capillary 169 (H) 65 - 99 mg/dL  Glucose, capillary     Status: Abnormal   Collection Time: 01/19/17  3:52 PM  Result Value Ref Range   Glucose-Capillary 129 (H) 65 - 99 mg/dL   Comment 1 Notify RN    Comment 2 Document in Chart   Glucose, capillary     Status: Abnormal   Collection Time: 01/19/17 10:08 PM  Result Value Ref Range  Glucose-Capillary 117 (H) 65 - 99 mg/dL  Glucose, capillary     Status: Abnormal   Collection Time: 01/20/17  8:01 AM  Result Value Ref Range   Glucose-Capillary 107 (H) 65 - 99 mg/dL    ABGS No results  for input(s): PHART, PO2ART, TCO2, HCO3 in the last 72 hours.  Invalid input(s): PCO2 CULTURES Recent Results (from the past 240 hour(s))  Culture, blood (routine x 2)     Status: None (Preliminary result)   Collection Time: 01/18/17  3:41 PM  Result Value Ref Range Status   Specimen Description LEFT ANTECUBITAL  Final   Special Requests BOTTLES DRAWN AEROBIC AND ANAEROBIC [facility] EACH  Final   Culture  Setup Time   Final    GRAM NEGATIVE RODS AEROBIC AND ANAEROBIC BOTTLES Gram Stain Report Called to,Read Back By and Verified With: HOWARD,C AT 0725 BY HUFFINES,S ON 01/19/17 Performed at [facility], [place] [address], [place], [place] [address]    Culture NO GROWTH 2 DAYS  Final   Report Status PENDING  Incomplete  Culture, blood (routine x 2)     Status: None (Preliminary result)   Collection Time: 01/18/17  3:41 PM  Result Value Ref Range Status   Specimen Description BLOOD LEFT HAND  Final   Special Requests BOTTLES DRAWN AEROBIC AND ANAEROBIC 6CC EACH  Final   Culture  Setup Time   Final    GRAM NEGATIVE RODS AEROBIC AND ANAEROBIC BOTTLES Gram Stain Report Called to,Read Back By and Verified With: HOWARD,C AT 0725 BY HUFFINES,S ON 01/19/17. Organism ID to follow CRITICAL RESULT CALLED TO, READ BACK BY AND VERIFIED WITHJuel Burrow PHARMD 1535 01/19/17 A BROWNING Performed at [facility], [place] [address], [place], [place] [address]    Culture NO GROWTH 2 DAYS  Final   Report Status PENDING  Incomplete  Blood Culture ID Panel (Reflexed)     Status: Abnormal   Collection Time: 01/18/17  3:41 PM  Result Value Ref Range Status   Enterococcus species NOT DETECTED NOT DETECTED Final   Listeria monocytogenes NOT DETECTED NOT DETECTED Final   Staphylococcus species NOT DETECTED NOT DETECTED Final   Staphylococcus aureus NOT DETECTED NOT DETECTED Final   Streptococcus species NOT DETECTED NOT DETECTED Final   Streptococcus agalactiae NOT DETECTED NOT DETECTED Final   Streptococcus  pneumoniae NOT DETECTED NOT DETECTED Final   Streptococcus pyogenes NOT DETECTED NOT DETECTED Final   Acinetobacter baumannii NOT DETECTED NOT DETECTED Final   Enterobacteriaceae species DETECTED (A) NOT DETECTED Final    Comment: Enterobacteriaceae represent a large family of gram-negative bacteria, not a single organism. CRITICAL RESULT CALLED TO, READ BACK BY AND VERIFIED WITH: S HALL PHARMD 1535 01/19/17 A BROWNING    Enterobacter cloacae complex NOT DETECTED NOT DETECTED Final   Escherichia coli NOT DETECTED NOT DETECTED Final   Klebsiella oxytoca NOT DETECTED NOT DETECTED Final   Klebsiella pneumoniae NOT DETECTED NOT DETECTED Final   Proteus species NOT DETECTED NOT DETECTED Final   Serratia marcescens DETECTED (A) NOT DETECTED Final    Comment: CRITICAL RESULT CALLED TO, READ BACK BY AND VERIFIED WITH: S HALL PHARMD 1535 01/19/17 A BROWNING    Carbapenem resistance NOT DETECTED NOT DETECTED Final   Haemophilus influenzae NOT DETECTED NOT DETECTED Final   Neisseria meningitidis NOT DETECTED NOT DETECTED Final   Pseudomonas aeruginosa NOT DETECTED NOT DETECTED Final   Candida albicans NOT DETECTED NOT DETECTED Final   Candida glabrata NOT DETECTED NOT  DETECTED Final   Candida krusei NOT DETECTED NOT DETECTED Final   Candida parapsilosis NOT DETECTED NOT DETECTED Final   Candida tropicalis NOT DETECTED NOT DETECTED Final    Comment: Performed at [facility], [place] [address], [place], [place] [address]  MRSA PCR Screening     Status: None   Collection Time: 01/18/17 10:25 PM  Result Value Ref Range Status   MRSA by PCR NEGATIVE NEGATIVE Final    Comment:        The GeneXpert MRSA Assay (FDA approved for NASAL specimens only), is one component of a comprehensive MRSA colonization surveillance program. It is not intended to diagnose MRSA infection nor to guide or monitor treatment for MRSA infections.    Studies/Results: Dg Chest Port 1 View  Result Date:  01/19/2017 CLINICAL DATA:  Status post central line placement EXAM: PORTABLE CHEST 1 VIEW COMPARISON:  01/18/2017 FINDINGS: Right jugular line is noted with the catheter tip in the mid to distal superior vena cava. No pneumothorax is noted. The lungs are clear. The cardiac shadow is stable. IMPRESSION: No pneumothorax following right jugular central line placement. Electronically Signed   By: Inez Catalina M.D.   On: 01/19/2017 12:11   Dg Chest Port 1 View  Result Date: 01/18/2017 CLINICAL DATA:  Short of breath, cough, fever and chills today. Pneumonia 2 weeks ago. EXAM: PORTABLE CHEST 1 VIEW COMPARISON:  12/31/2016. FINDINGS: Mild enlargement of the cardiac silhouette, stable. No mediastinal or hilar masses. No convincing adenopathy. Mild vascular congestion. No convincing pulmonary edema. No evidence of pneumonia. No convincing pleural effusion.  No pneumothorax. Skeletal structures are intact. IMPRESSION: No convincing acute cardiopulmonary disease. No definitive pneumonia or pulmonary edema. Electronically Signed   By: Lajean Manes M.D.   On: 01/18/2017 16:02    Medications:  Prior to Admission:  Prescriptions Prior to Admission  Medication Sig Dispense Refill Last Dose  . acetaminophen (TYLENOL) 325 MG tablet Take 650 mg by mouth every 6 (six) hours as needed for moderate pain or fever.    01/17/2017 at Unknown time  . albuterol (PROVENTIL) (2.5 MG/3ML) 0.083% nebulizer solution Take 2.5 mg by nebulization every 6 (six) hours as needed for wheezing or shortness of breath.   Past Month at Unknown time  . ALPRAZolam (XANAX) 0.5 MG tablet Take 0.5 mg by mouth 3 (three) times daily as needed for anxiety or sleep.    01/17/2017 at Unknown time  . amLODipine (NORVASC) 10 MG tablet Take 0.5 tablets (5 mg total) by mouth daily. 15 tablet 12 01/17/2017 at Unknown time  . cinacalcet (SENSIPAR) 30 MG tablet Take 30 mg by mouth daily.   01/17/2017 at Unknown time  . diphenhydrAMINE (BENADRYL) 25 mg capsule  Take 25 mg by mouth every 6 (six) hours as needed for allergies.   unknown  . Insulin Glargine (LANTUS SOLOSTAR) 100 UNIT/ML Solostar Pen Inject 10 Units into the skin daily at 10 pm. 15 mL 11 01/17/2017 at Unknown time  . metoprolol (LOPRESSOR) 50 MG tablet Take 0.5 tablets (25 mg total) by mouth 2 (two) times daily. 30 tablet 12 01/17/2017 at Unknown time  . multivitamin (RENA-VIT) TABS tablet Take 1 tablet by mouth daily.   01/17/2017 at Unknown time  . Oxycodone HCl 10 MG TABS Take 1 tablet (10 mg total) by mouth 3 (three) times daily as needed (pain). (Patient taking differently: Take 10 mg by mouth 4 (four) times daily as needed (pain). ) 15 tablet 0 unknown  .  sevelamer carbonate (RENVELA) 800 MG tablet Take 1,600-3,200 mg by mouth See admin instructions. Take 3200 mg by mouth 3 times daily with meals and take 1600 mg by mouth with snacks.   01/17/2017 at Unknown time  . doxycycline (VIBRAMYCIN) 50 MG capsule Take 2 capsules (100 mg total) by mouth 2 (two) times daily. (Patient not taking: Reported on 01/18/2017) 20 capsule 0 Not Taking at Unknown time  . predniSONE (STERAPRED UNI-PAK 21 TAB) 10 MG (21) TBPK tablet Take by package instructions (Patient not taking: Reported on 01/18/2017) 21 tablet 0 Not Taking at Unknown time   Scheduled: . ceFEPime (MAXIPIME) IV  2 g Intravenous Once   Followed by  . [START ON 01/21/2017] ceFEPime (MAXIPIME) IV  1 g Intravenous Q24H  . epoetin (EPOGEN/PROCRIT) injection  8,000 Units Intravenous Q M,W,F-HD  . insulin aspart  0-9 Units Subcutaneous TID WC  . levofloxacin (LEVAQUIN) IV  750 mg Intravenous Once   Followed by  . levofloxacin (LEVAQUIN) IV  500 mg Intravenous Q48H  . mouth rinse  15 mL Mouth Rinse BID  . oseltamivir  30 mg Oral QHS   Continuous: . phenylephrine ([name]-SYNEPHRINE) Adult infusion 60 mcg/min (01/20/17 0803)   MKJ:IZXYOF chloride, sodium chloride, acetaminophen, albuterol, ALPRAZolam, alteplase, heparin, heparin, levalbuterol, lidocaine  (PF), lidocaine-prilocaine, ondansetron **OR** ondansetron (ZOFRAN) IV, oxyCODONE, pentafluoroprop-tetrafluoroeth  Assesment: She has cellulitis. She is growing gram-negative rods in the blood. She has end-stage renal disease on dialysis. She still having fever and her blood pressure is lower so I think she may still be septic although that had improved yesterday. She's had fluid boluses 2 so I think she's going to need to start a pressor. I'm going to adjust her antibiotics after discussion with infectious disease. Principal Problem:   Fever Active Problems:   Diabetes mellitus ([place])   Morbid obesity ([place])   Obesity hypoventilation syndrome (H[facility])   Chronic diastolic CHF (congestive heart failure) (HCC)   Chronic respiratory failure with hypoxia (HCC)   ESRD on dialysis ([place])   Flu-like symptoms    Plan: Add [name]-Synephrine. Add cefepime.    LOS: 2 days   Valinda Fedie L 01/20/2017, 8:29 AM

## 2017-01-20 NOTE — Consult Note (Signed)
CARDIOLOGY CONSULT NOTE  Patient ID: MAGUADALUPE LATA MRN: [id number] DOB/AGE: 1966-03-04 51 y.o.  Admit date: 01/18/2017 Primary Physician: Alonza Bogus, MD Referring Physician:   Reason for Consultation: elevated troponin  HPI: 50 yr old woman with end stage renal disease on hemodialysis, chronic lower extremity lymphedema, morbid obesity, recurrent cellulitis, OSA, and chronic hypoxic respiratory failure recently hospitalized for pneumonia.  She presented with left leg pain and fever and has been diagnosed with cellulitis and gram-negative bacteremia/sepsis. Lactate elevated (8.2). She was hypotensive (64/40) and is now on pressors.  Developed chest pain shortly after pressors were started, lasting 15 minutes. Currently denies chest pain. Said last time she had chest pain was about a year ago when she had "CHF", although I do not find a hospitalization for CHF in 2017 (multiple other hospitalizations noted).  Troponins: 0.03-->0.21-->0.97.  Chest xray today showed CHF.  Echocardiogram 11/14/2011: Normal LV systolic function, EF 90-24%, ?regional wall motion abnormalities, grade 2 diastolic dysfunction.  Echocardiogram this admission has been ordered and is pending.  ECG today shows sinus rhythm with possible old inferior infarct.  Currently on cefepime and Levaquin.  WBC 12.7, Hgb 9.9, platelets 104.    Allergies  Allergen Reactions  . Contrast Media [Iodinated Diagnostic Agents] Anaphylaxis, Hives, Swelling and Other (See Comments)    Dye for cardiac cath. Tongue swells  . Pneumococcal Vaccines Swelling    Turns skin black, and bodily swelling  . Shellfish Allergy Itching, Swelling and Other (See Comments)    Facial swelling - Pt able to eat now 11/25/16  . Vancomycin Nausea And Vomiting and Other (See Comments)    Infusion "made me feel like I was dying" had to be readmitted to hospital    Current Facility-Administered Medications  Medication Dose  Route Frequency Provider Last Rate Last Dose  . 0.9 %  sodium chloride infusion  100 mL Intravenous PRN Manpreet S Bhutani, MD      . 0.9 %  sodium chloride infusion  100 mL Intravenous PRN Manpreet Toya Smothers, MD      . acetaminophen (TYLENOL) tablet 650 mg  650 mg Oral Q4H PRN [name], MD   650 mg at 01/20/17 0020  . albuterol (PROVENTIL) (2.5 MG/3ML) 0.083% nebulizer solution 2.5 mg  2.5 mg Nebulization Q6H PRN Phillips Grout, MD   2.5 mg at 01/20/17 1433  . ALPRAZolam Duanne Moron) tablet 0.5 mg  0.5 mg Oral TID PRN Phillips Grout, MD   0.5 mg at 01/18/17 2241  . alteplase (CATHFLO ACTIVASE) injection 2 mg  2 mg Intracatheter Once PRN Liana Gerold, MD      . Derrill Memo ON 01/21/2017] ceFEPIme (MAXIPIME) 2 g in dextrose 5 % 50 mL IVPB  2 g Intravenous Q M,W,F-HD [name], MD      . epoetin alfa (EPOGEN,PROCRIT) injection 8,000 Units  8,000 Units Intravenous Q M,W,F-HD Liana Gerold, MD   8,000 Units at 01/19/17 1527  . heparin injection 1,000 Units  1,000 Units Dialysis PRN Liana Gerold, MD      . heparin injection 2,900 Units  20 Units/kg Dialysis PRN Manpreet Toya Smothers, MD      . insulin aspart (novoLOG) injection 0-9 Units  0-9 Units Subcutaneous TID WC Phillips Grout, MD   1 Units at 01/20/17 1156  . levalbuterol (XOPENEX) nebulizer solution 1.25 mg  1.25 mg Nebulization Q6H PRN Phillips Grout, MD   1.25 mg at 01/18/17 2058  . levofloxacin (  LEVAQUIN) IVPB 750 mg  750 mg Intravenous Once Phillips Grout, MD       Followed by  . levofloxacin (LEVAQUIN) IVPB 500 mg  500 mg Intravenous Q48H Phillips Grout, MD 100 mL/hr at 01/18/17 2130 500 mg at 01/18/17 2130  . lidocaine (PF) (XYLOCAINE) 1 % injection 5 mL  5 mL Intradermal PRN Manpreet Toya Smothers, MD      . lidocaine-prilocaine (EMLA) cream 1 application  1 application Topical PRN Manpreet Toya Smothers, MD      . MEDLINE mouth rinse  15 mL Mouth Rinse BID [name], MD   15 mL at 01/20/17 0946  . ondansetron (ZOFRAN) tablet 4  mg  4 mg Oral Q6H PRN Phillips Grout, MD       Or  . ondansetron [facility]) injection 4 mg  4 mg Intravenous Q6H PRN Phillips Grout, MD   4 mg at 01/20/17 0908  . oseltamivir (TAMIFLU) capsule 30 mg  30 mg Oral QHS Phillips Grout, MD   30 mg at 01/19/17 2239  . oxyCODONE (Oxy IR/ROXICODONE) immediate release tablet 10 mg  10 mg Oral TID PRN Phillips Grout, MD   10 mg at 01/20/17 1045  . pentafluoroprop-tetrafluoroeth (GEBAUERS) aerosol 1 application  1 application Topical PRN Manpreet Toya Smothers, MD      . phenylephrine ([name]-SYNEPHRINE) 40 mg in sodium chloride 0.9 % 250 mL (0.16 mg/mL) infusion  0-400 mcg/min Intravenous Titrated [name], MD 71.3 mL/hr at 01/20/17 1401 190 mcg/min at 01/20/17 1401    Past Medical History:  Diagnosis Date  . Anxiety   . Asthma    as a child  . Cancer ([place])    thyroid  . Cellulitis   . CHF (congestive heart failure) ([place])   . Chiari malformation    s/p surgery  . Chiari malformation   . Chronic pain   . Complication of anesthesia 11/28/15   Resp arrest after  conscious  sedation  . Constipation   . Coronary artery disease    40-50% mid LAD 04/29/09, Medical tx. (Dr. Gwenlyn Found)  . Diabetes mellitus    Type II  . Fibromyalgia   . History of blood transfusion    hemorrage duinrg pregancy  . Hx of echocardiogram 10/2011   EF 55-60%  . Hypercholesterolemia   . Hypertension   . Lymph edema   . Obesity hypoventilation syndrome ([place])   . On home O2   . Pneumonia    in past  . Renal disorder    Pt started dialysis in Dec.2016  . S/P colonoscopy 05/26/2007   Dr. Laural Golden sigmoid diverticulosis random biopsies benign  . S/P endoscopy 05/01/2009   Dr. Penelope Coop pill-induced esophageal ulcerations distal to midesophagus, 2 small ulcers in the antrum of the stomach  . Shortness of breath dyspnea    with any exertion or if heart rate  is irregular while on dialysis  . Sleep apnea     Past Surgical History:  Procedure Laterality Date  . Marland KitchenHemodialysis  catheter Right 11/28/2015  . ABDOMINAL HYSTERECTOMY    . ADENOIDECTOMY    . AV FISTULA PLACEMENT Left 03/15/2016   Procedure: CREATION OF LEFT ARM ARTERIOVENOUS (AV) FISTULA  ;  Surgeon: Rosetta Posner, MD;  Location: [place];  Service: Vascular;  Laterality: Left;  . CESAREAN SECTION      x 2  . CRANIECTOMY SUBOCCIPITAL W/ CERVICAL LAMINECTOMY / CHIARI    . FISTULA SUPERFICIALIZATION Left 05/10/2016   Procedure:  Left Arm FISTULA SUPERFICIALIZATION;  Surgeon: Rosetta Posner, MD;  Location[facility] OR;  Service: Vascular;  Laterality: Left;  . IR GENERIC HISTORICAL  08/14/2016   IR REMOVAL TUN ACCESS W/ PORT W/O FL MOD SED 08/14/2016 Arne Cleveland, MD [facility]-INTER[facility] RAD  . PERIPHERAL VASCULAR CATHETERIZATION Left 11/25/2016   Procedure: A/V Fistulagram;  Surgeon: Conrad [place], MD;  Location: [facility];  Service: Cardiovascular;  Laterality: Left;  arm  . PORTACATH PLACEMENT  07/05/2012   Procedure: INSERTION PORT-A-CATH;  Surgeon: Donato Heinz, MD;  Location: [facility];  Service: General;  Laterality: Left;  subclavian  . THYROIDECTOMY, PARTIAL    . TONSILLECTOMY      Social History   Social History  . Marital status: Married    Spouse name: N/A  . Number of children: 3  . Years of education: N/A   Occupational History  . Not on file.   Social History Main Topics  . Smoking status: Never Smoker  . Smokeless tobacco: Never Used  . Alcohol use No  . Drug use: No  . Sexual activity: No   Other Topics Concern  . 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  Prior to Admission medications   Medication Sig Start Date End Date Taking? Authorizing Provider  acetaminophen (TYLENOL) 325 MG tablet Take 650 mg by mouth every 6 (six) hours as needed for moderate pain or fever.    Yes Historical Provider, MD  albuterol (PROVENTIL) (2.5 MG/3ML) 0.083% nebulizer solution Take 2.5 mg by nebulization every 6  (six) hours as needed for wheezing or shortness of breath.   Yes Historical Provider, MD  ALPRAZolam Duanne Moron) 0.5 MG tablet Take 0.5 mg by mouth 3 (three) times daily as needed for anxiety or sleep.    Yes Historical Provider, MD  amLODipine (NORVASC) 10 MG tablet Take 0.5 tablets (5 mg total) by mouth daily. 06/11/16  Yes [name], MD  cinacalcet (SENSIPAR) 30 MG tablet Take 30 mg by mouth daily.   Yes Historical Provider, MD  diphenhydrAMINE (BENADRYL) 25 mg capsule Take 25 mg by mouth every 6 (six) hours as needed for allergies.   Yes Historical Provider, MD  Insulin Glargine (LANTUS SOLOSTAR) 100 UNIT/ML Solostar Pen Inject 10 Units into the skin daily at 10 pm. 01/06/17  Yes [name], MD  metoprolol (LOPRESSOR) 50 MG tablet Take 0.5 tablets (25 mg total) by mouth 2 (two) times daily. 12/04/15  Yes [name], MD  multivitamin (RENA-VIT) TABS tablet Take 1 tablet by mouth daily.   Yes Historical Provider, MD  Oxycodone HCl 10 MG TABS Take 1 tablet (10 mg total) by mouth 3 (three) times daily as needed (pain). Patient taking differently: Take 10 mg by mouth 4 (four) times daily as needed (pain).  05/10/16  Yes Alvia Grove, PA-C  sevelamer carbonate (RENVELA) 800 MG tablet Take 1,600-3,200 mg by mouth See admin instructions. Take 3200 mg by mouth 3 times daily with meals and take 1600 mg by mouth with snacks.   Yes Historical Provider, MD  doxycycline (VIBRAMYCIN) 50 MG capsule Take 2 capsules (100 mg total) by mouth 2 (two) times daily. Patient not taking: Reported on 01/18/2017 01/06/17   [name], MD  predniSONE (STERAPRED UNI-PAK 21 TAB) 10 MG (21) TBPK tablet Take by package instructions Patient not taking: Reported on 01/18/2017 01/06/17   [name],  MD     Review of systems complete and found to be negative unless listed above in HPI     Physical exam Blood pressure 115/74, pulse 93, temperature 98.1 F (36.7 C), temperature source Oral, resp. rate (!) 23,  height _0  (1.651 m), weight (!) 313 lb 11.4 oz (142.3 kg), SpO2 100 %. General: NAD Neck: Thick, difficult to assess JVP due to body habitus.  Lungs: Diminished anteriorly. CV: Nondisplaced PMI. Regular rate and rhythm, normal S1/S2, no S3/S4, no murmur.  Marked bilateral lymphedema, left > right.   Abdomen: Obese  Skin: Chronic lower extremity stasis dermatitis. Neurologic: Alert and oriented x 3.  Psych: Normal affect. HEENT: Normal.   ECG: Most recent ECG reviewed.  Labs:   Lab Results  Component Value Date   WBC 12.7 (H) 01/20/2017   HGB 9.9 (L) 01/20/2017   HCT 29.9 (L) 01/20/2017   MCV 97.1 01/20/2017   PLT 104 (L) 01/20/2017    Recent Labs Lab 01/18/17 1534 01/19/17 0212  NA 137 135  K 3.8 4.4  CL 98* 104  CO2 25 18*  BUN 59* 58*  CREATININE 4.86* 4.84*  CALCIUM 8.8* 7.1*  PROT 7.5  --   BILITOT 0.6  --   ALKPHOS 112  --   ALT 20  --   AST 19  --   GLUCOSE 143* 193*   Lab Results  Component Value Date   CKTOTAL 33 (L) 08/13/2016   CKMB 1.6 11/15/2011   TROPONINI 0.97 (HH) 01/20/2017    Lab Results  Component Value Date   CHOL  04/29/2009    167        ATP III CLASSIFICATION:  <200     mg/dL   Desirable  200-239  mg/dL   Borderline High  >=240    mg/dL   High          Lab Results  Component Value Date   HDL 37 (L) 04/29/2009   Lab Results  Component Value Date   LDLCALC  04/29/2009    86        Total Cholesterol/HDL:CHD Risk Coronary Heart Disease Risk Table                     Men   Women  1/2 Average Risk   3.4   3.3  Average Risk       5.0   4.4  2 X Average Risk   9.6   7.1  3 X Average Risk  23.4   11.0        Use the calculated Patient Ratio above and the CHD Risk Table to determine the patient's CHD Risk.        ATP III CLASSIFICATION (LDL):  <100     mg/dL   Optimal  100-129  mg/dL   Near or Above                    Optimal  130-159  mg/dL   Borderline  160-189  mg/dL   High  >190     mg/dL   Very High   Lab Results   Component Value Date   TRIG 220 (H) 04/29/2009   Lab Results  Component Value Date   CHOLHDL 4.5 04/29/2009   No results found for: LDLDIRECT       Studies: Dg Chest Port 1 View  Result Date: 01/20/2017 CLINICAL DATA:  Chest pain. EXAM: PORTABLE CHEST 1 VIEW COMPARISON:  01/19/2017.  FINDINGS: Right IJ line noted in stable position. Cardiomegaly with bilateral pulmonary interstitial prominence. Findings consistent with congestive heart failure. Basilar atelectasis. Small left pleural effusion cannot be excluded. No pneumothorax. IMPRESSION: 1. Right IJ line stable position. 2. Cardiomegaly with mild bilateral from interstitial prominence consistent congestive heart failure. Small left pleural effusion cannot be excluded. 2. Low lung volumes with basilar atelectasis Electronically Signed   By: Marcello Moores  Register   On: 01/20/2017 09:07   Dg Chest Port 1 View  Result Date: 01/19/2017 CLINICAL DATA:  Status post central line placement EXAM: PORTABLE CHEST 1 VIEW COMPARISON:  01/18/2017 FINDINGS: Right jugular line is noted with the catheter tip in the mid to distal superior vena cava. No pneumothorax is noted. The lungs are clear. The cardiac shadow is stable. IMPRESSION: No pneumothorax following right jugular central line placement. Electronically Signed   By: Inez Catalina M.D.   On: 01/19/2017 12:11    ASSESSMENT AND PLAN:  1. Troponin elevation in context of sepsis: She currently denies chest pain and is on pressors. ECG shows possible old inferior infarct, but no acute ischemic changes. Likely representative of demand ischemia from sepsis/hypotension with markedly elevated lactate. I would not add ASA at this time given anemia and thrombocytopenia (104 k).  I will review echocardiogram to assess for new wall motion abnormalities and/or LVEF reduction when compared to 2012 echo. Outpatient nuclear stress testing could be considered (2-day Lexiscan).  2. Sepsis/gram negative bacteremia/left  leg cellulitis: Currently on neosynephrine, Levaquin, and cefepime.  3. Chronic diastolic heart failure: Previously reported as having grade 2 diastolic dysfunction. Volume management via hemodialysis.   Signed: Kate Sable, M.D., F.A.C.C.  01/20/2017, 4:39 PM

## 2017-01-20 NOTE — Progress Notes (Signed)
Patient is status post 1 L bolus, MD Hawkins to bedside.  Patient bp after bolus 70/50 with map of 50.  Patient to receive an additional 1L of fluid at this time.  Will continue to monitor the patient closely.

## 2017-01-20 NOTE — Progress Notes (Signed)
Lactic 6.3 troponin 0.21, Dr. [name] aware: on unit to see patient.

## 2017-01-20 NOTE — Progress Notes (Addendum)
Pharmacy Antibiotic Note  [name] is a 51 y.o. female admitted on 01/18/2017 with sepsis.  Pharmacy has been consulted for cefepime dosing. Blood cx + for Serratia. She has cellulitis. She is still having fevers. MD to discuss antibiotics with ID.  Plan: Cefepime 2gm now, and then 2gm after each HD session Continue levaquin 562m IV every 48 hours. ESRD Monitor labs, micro and vitals. F/U plan  Height: 5' 5" (165.1 cm) Weight: (!) 313 lb 11.4 oz (142.3 kg) IBW/kg (Calculated) : 57  Temp (24hrs), Avg:99 F (37.2 C), Min:97.8 F (36.6 C), Max:100.5 F (38.1 C)   Recent Labs Lab 01/18/17 1534  01/18/17 2214 01/19/17 0212 01/19/17 0547 01/19/17 0940 01/20/17 0810 01/20/17 0813  WBC 20.4*  --   --  9.5  --  11.9*  --  12.7*  CREATININE 4.86*  --   --  4.84*  --   --   --   --   LATICACIDVEN  --   < > 3.2* 3.4* 2.4* 2.9* 6.3*  --   < > = values in this interval not displayed.  Estimated Creatinine Clearance: 20 mL/min (by C-G formula based on SCr of 4.84 mg/dL (H)).    Allergies  Allergen Reactions  . Contrast Media [Iodinated Diagnostic Agents] Anaphylaxis, Hives, Swelling and Other (See Comments)    Dye for cardiac cath. Tongue swells  . Pneumococcal Vaccines Swelling    Turns skin black, and bodily swelling  . Shellfish Allergy Itching, Swelling and Other (See Comments)    Facial swelling - Pt able to eat now 11/25/16  . Vancomycin Nausea And Vomiting and Other (See Comments)    Infusion "made me feel like I was dying" had to be readmitted to hospital    Antimicrobials this admission: Levaquin 1/30 >>  Cefepime 2/1 >>  Dose adjustments this admission: ESRD dosing  Microbiology results: 1/30 Bcx: Serratia Marcescens x  2 bottles 1/30 MRSA PCR: negative 1/30 influenza: negative  Thank you for allowing pharmacy to be a part of this patient's care. LIsac Sarna BS PVena Austria BCaliforniaClinical Pharmacist Pager #[phone] 01/20/2017 12:02 PM

## 2017-01-20 NOTE — Progress Notes (Signed)
Subjective: Interval History: has complaints of nausea and vomiting. Patient complains of feeling weak. She denies any cough or sputum production..  Objective: Vital signs in last 24 hours: Temp:  [97.8 F (36.6 C)-100.5 F (38.1 C)] 97.8 F (36.6 C) (02/01 0754) Pulse Rate:  [89-122] 95 (02/01 0815) Resp:  [14-30] 14 (02/01 0815) BP: (64-150)/(35-94) 94/46 (02/01 0815) SpO2:  [92 %-100 %] 99 % (02/01 0815) Weight:  [142.3 kg (313 lb 11.4 oz)-143.3 kg (315 lb 14.7 oz)] 142.3 kg (313 lb 11.4 oz) (02/01 0500) Weight change: 5.407 kg (11 lb 14.7 oz)  Intake/Output from previous day: 01/31 0701 - 02/01 0700 In: -  Out: 2900  Intake/Output this shift: No intake/output data recorded.  General appearance: alert, cooperative and no distress Resp: diminished breath sounds anterior - bilateral and wheezes bilaterally Cardio: regular rate and rhythm Extremities: edema Patient with venous stasis and chronic leg edema  Lab Results:  Recent Labs  01/19/17 0940 01/20/17 0813  WBC 11.9* 12.7*  HGB 9.6* 9.9*  HCT 29.0* 29.9*  PLT 146* PENDING   BMET:  Recent Labs  01/18/17 1534 01/19/17 0212  NA 137 135  K 3.8 4.4  CL 98* 104  CO2 25 18*  GLUCOSE 143* 193*  BUN 59* 58*  CREATININE 4.86* 4.84*  CALCIUM 8.8* 7.1*   No results for input(s): PTH in the last 72 hours. Iron Studies: No results for input(s): IRON, TIBC, TRANSFERRIN, FERRITIN in the last 72 hours.  Studies/Results: Dg Chest Port 1 View  Result Date: 01/20/2017 CLINICAL DATA:  Chest pain. EXAM: PORTABLE CHEST 1 VIEW COMPARISON:  01/19/2017. FINDINGS: Right IJ line noted in stable position. Cardiomegaly with bilateral pulmonary interstitial prominence. Findings consistent with congestive heart failure. Basilar atelectasis. Small left pleural effusion cannot be excluded. No pneumothorax. IMPRESSION: 1. Right IJ line stable position. 2. Cardiomegaly with mild bilateral from interstitial prominence consistent congestive  heart failure. Small left pleural effusion cannot be excluded. 2. Low lung volumes with basilar atelectasis Electronically Signed   By: Marcello Moores  Register   On: 01/20/2017 09:07   Dg Chest Port 1 View  Result Date: 01/19/2017 CLINICAL DATA:  Status post central line placement EXAM: PORTABLE CHEST 1 VIEW COMPARISON:  01/18/2017 FINDINGS: Right jugular line is noted with the catheter tip in the mid to distal superior vena cava. No pneumothorax is noted. The lungs are clear. The cardiac shadow is stable. IMPRESSION: No pneumothorax following right jugular central line placement. Electronically Signed   By: Inez Catalina M.D.   On: 01/19/2017 12:11   Dg Chest Port 1 View  Result Date: 01/18/2017 CLINICAL DATA:  Short of breath, cough, fever and chills today. Pneumonia 2 weeks ago. EXAM: PORTABLE CHEST 1 VIEW COMPARISON:  12/31/2016. FINDINGS: Mild enlargement of the cardiac silhouette, stable. No mediastinal or hilar masses. No convincing adenopathy. Mild vascular congestion. No convincing pulmonary edema. No evidence of pneumonia. No convincing pleural effusion.  No pneumothorax. Skeletal structures are intact. IMPRESSION: No convincing acute cardiopulmonary disease. No definitive pneumonia or pulmonary edema. Electronically Signed   By: Lajean Manes M.D.   On: 01/18/2017 16:02    I have reviewed the patient's current medications.  Assessment/Plan: Problem #1 sepsis: Presently patient with fever, elevated white blood cell count. Blood culture is growing Serratia and Enterobacter. Patient is on antibiotics. Her blood pressure seems to be somewhat low. Problem #2 end-stage renal disease: She is status post hemodialysis yesterday. Her potassium is normal. Problem #3 anemia: Her hemoglobin is with in  our target goal. Problem #4 fluid management: She is status post 3 L fluid removal with dialysis yesterday.  Problem #5 metabolic bone disease: Calcium is in range Problem # 6 sleep apnea Problem #7 morbid  obesity Problem #8 diabetes Plan: We'll continue his antibiotics We'll check her CBC and renal panel in the morning. We'll make a decision about dialysis tomorrow.   LOS: 2 days   Emmalie Haigh S 01/20/2017,9:22 AM

## 2017-01-20 NOTE — Progress Notes (Signed)
Patient's troponin is currently 3.57. Patient denies any chest pain at this moment . Cardiology was called and updated, no new orders received at this time. Continue to monitor the patient.

## 2017-01-20 NOTE — Progress Notes (Signed)
[name] started per MD order. Pt reporting "crushing" chest pain and pain between shoulder blades, EKG and lab work ordered, Dr. [name] made aware via phone call. Pt currently in bed, VS stable.

## 2017-01-20 NOTE — Progress Notes (Signed)
Troponin elevated to 0.97. Dr. [name] made aware via phone call.

## 2017-01-20 NOTE — Progress Notes (Signed)
CRITICAL VALUE ALERT  Critical value received:  Lactic 8.2  Date of notification:  01/20/2017  Time of notification:  1640  Nurse who received alert:  Lysbeth Galas RN  MD notified (1st page):  [name]  Time of first page:  [phone]  Time MD responded:  1833  Verbal order: 1,082m Bolus @ 504mhr

## 2017-01-20 NOTE — Progress Notes (Signed)
She now has better BP but elevated troponin and elevated lactate. Will repeat labs, get echo and request cardiology consult.   Critical care time 1 hour

## 2017-01-20 NOTE — Progress Notes (Signed)
In to see patient at this time, and patient to have notable decreased bp's with sbp's running from 70-80's, and maps 50-55.  MD [name] notified of night dialysis amount of 2900, and patient elevated temp during the night.  MD [name] to order 1L bolus at this time.  Will continue to monitor the patient closely.

## 2017-01-21 ENCOUNTER — Inpatient Hospital Stay (HOSPITAL_COMMUNITY): Payer: Medicare Other

## 2017-01-21 ENCOUNTER — Encounter: Payer: Self-pay | Admitting: Internal Medicine

## 2017-01-21 DIAGNOSIS — R7989 Other specified abnormal findings of blood chemistry: Secondary | ICD-10-CM

## 2017-01-21 DIAGNOSIS — R748 Abnormal levels of other serum enzymes: Secondary | ICD-10-CM

## 2017-01-21 DIAGNOSIS — R778 Other specified abnormalities of plasma proteins: Secondary | ICD-10-CM | POA: Diagnosis present

## 2017-01-21 DIAGNOSIS — R509 Fever, unspecified: Secondary | ICD-10-CM

## 2017-01-21 LAB — COOXEMETRY PANEL
CARBOXYHEMOGLOBIN: 3.2 % — AB (ref 0.5–1.5)
Methemoglobin: 0.9 % (ref 0.0–1.5)
O2 SAT: 96.9 %
TOTAL OXYGEN CONTENT: 14.3 mL/dL — AB (ref 15.0–23.0)
Total hemoglobin: 10.8 g/dL — ABNORMAL LOW (ref 12.0–16.0)

## 2017-01-21 LAB — ECHOCARDIOGRAM COMPLETE
CHL CUP DOP CALC LVOT VTI: 24.7 cm
CHL CUP MV DEC (S): 356
E decel time: 356 msec
EERAT: 6.21
FS: 41 % (ref 28–44)
Height: 65 in
IV/PV OW: 0.97
LA vol A4C: 46.5 ml
LADIAMINDEX: 1.2 cm/m2
LASIZE: 32 mm
LAVOL: 46.9 mL
LAVOLIN: 17.6 mL/m2
LDCA: 2.27 cm2
LEFT ATRIUM END SYS DIAM: 32 mm
LV PW d: 14.8 mm — AB (ref 0.6–1.1)
LV SIMPSON'S DISK: 66
LV dias vol: 70 mL (ref 46–106)
LV e' LATERAL: 10.1 cm/s
LV sys vol index: 9 mL/m2
LV sys vol: 24 mL (ref 14–42)
LVDIAVOLIN: 26 mL/m2
LVEEAVG: 6.21
LVEEMED: 6.21
LVOT diameter: 17 mm
LVOT peak grad rest: 6 mmHg
LVOTPV: 126 cm/s
LVOTSV: 56 mL
Lateral S' vel: 12.5 cm/s
MV pk A vel: 77.5 m/s
MV pk E vel: 62.7 m/s
RV TAPSE: 24.9 mm
RV sys press: 85 mmHg
Reg peak vel: 417 cm/s
Stroke v: 47 ml
TDI e' lateral: 10.1
TDI e' medial: 7.4
TR max vel: 417 cm/s
WEIGHTICAEL: 5086.45 [oz_av]

## 2017-01-21 LAB — CBC WITH DIFFERENTIAL/PLATELET
BASOS ABS: 0 10*3/uL (ref 0.0–0.1)
BASOS PCT: 0 %
Eosinophils Absolute: 0 10*3/uL (ref 0.0–0.7)
Eosinophils Relative: 0 %
HEMATOCRIT: 30.5 % — AB (ref 36.0–46.0)
HEMOGLOBIN: 10.3 g/dL — AB (ref 12.0–15.0)
Lymphocytes Relative: 8 %
Lymphs Abs: 1.2 10*3/uL (ref 0.7–4.0)
MCH: 32.4 pg (ref 26.0–34.0)
MCHC: 33.8 g/dL (ref 30.0–36.0)
MCV: 95.9 fL (ref 78.0–100.0)
Monocytes Absolute: 1.1 10*3/uL — ABNORMAL HIGH (ref 0.1–1.0)
Monocytes Relative: 7 %
NEUTROS ABS: 13.8 10*3/uL — AB (ref 1.7–7.7)
NEUTROS PCT: 85 %
Platelets: 87 10*3/uL — ABNORMAL LOW (ref 150–400)
RBC: 3.18 MIL/uL — AB (ref 3.87–5.11)
RDW: 15.1 % (ref 11.5–15.5)
WBC: 16.2 10*3/uL — AB (ref 4.0–10.5)

## 2017-01-21 LAB — GLUCOSE, CAPILLARY
GLUCOSE-CAPILLARY: 104 mg/dL — AB (ref 65–99)
Glucose-Capillary: 135 mg/dL — ABNORMAL HIGH (ref 65–99)
Glucose-Capillary: 139 mg/dL — ABNORMAL HIGH (ref 65–99)
Glucose-Capillary: 165 mg/dL — ABNORMAL HIGH (ref 65–99)

## 2017-01-21 LAB — CULTURE, BLOOD (ROUTINE X 2)

## 2017-01-21 LAB — CBC
HCT: 30.4 % — ABNORMAL LOW (ref 36.0–46.0)
HEMOGLOBIN: 10.2 g/dL — AB (ref 12.0–15.0)
MCH: 32.4 pg (ref 26.0–34.0)
MCHC: 33.6 g/dL (ref 30.0–36.0)
MCV: 96.5 fL (ref 78.0–100.0)
Platelets: 94 10*3/uL — ABNORMAL LOW (ref 150–400)
RBC: 3.15 MIL/uL — AB (ref 3.87–5.11)
RDW: 15.2 % (ref 11.5–15.5)
WBC: 17.2 10*3/uL — ABNORMAL HIGH (ref 4.0–10.5)

## 2017-01-21 LAB — RENAL FUNCTION PANEL
ANION GAP: 14 (ref 5–15)
Albumin: 2.9 g/dL — ABNORMAL LOW (ref 3.5–5.0)
BUN: 51 mg/dL — ABNORMAL HIGH (ref 6–20)
CO2: 20 mmol/L — AB (ref 22–32)
Calcium: 8 mg/dL — ABNORMAL LOW (ref 8.9–10.3)
Chloride: 98 mmol/L — ABNORMAL LOW (ref 101–111)
Creatinine, Ser: 5.47 mg/dL — ABNORMAL HIGH (ref 0.44–1.00)
GFR, EST AFRICAN AMERICAN: 10 mL/min — AB (ref >60)
GFR, EST NON AFRICAN AMERICAN: 8 mL/min — AB (ref >60)
Glucose, Bld: 153 mg/dL — ABNORMAL HIGH (ref 65–99)
PHOSPHORUS: 7.7 mg/dL — AB (ref 2.5–4.6)
Potassium: 4.8 mmol/L (ref 3.5–5.1)
Sodium: 132 mmol/L — ABNORMAL LOW (ref 135–145)

## 2017-01-21 LAB — TROPONIN I: TROPONIN I: 5.31 ng/mL — AB (ref <0.03)

## 2017-01-21 LAB — LACTIC ACID, PLASMA: LACTIC ACID, VENOUS: 2.8 mmol/L — AB (ref 0.5–1.9)

## 2017-01-21 MED ORDER — LIDOCAINE HCL (PF) 1 % IJ SOLN: 5.0000 mL | INTRAMUSCULAR | Status: DC | PRN

## 2017-01-21 MED ORDER — SODIUM CHLORIDE 0.9% FLUSH: 10.0000 mL | INTRAVENOUS | Status: DC | PRN

## 2017-01-21 MED ORDER — SODIUM CHLORIDE 0.9 % IV SOLN: 100.0000 mL | INTRAVENOUS | Status: DC | PRN

## 2017-01-21 MED ORDER — SODIUM CHLORIDE 0.9% FLUSH
10.0000 mL | Freq: Two times a day (BID) | INTRAVENOUS | Status: DC
  Administered 2017-01-21 – 2017-01-30 (×15): 10 mL
  Administered 2017-01-31: 20 mL
  Administered 2017-01-31 – 2017-02-01 (×2): 10 mL

## 2017-01-21 MED ORDER — GUAIFENESIN-DM 100-10 MG/5ML PO SYRP
5.0000 mL | ORAL_SOLUTION | ORAL | Status: DC | PRN
  Administered 2017-01-21: 5 mL via ORAL
  Filled 2017-01-21: qty 5

## 2017-01-21 MED ORDER — PHENYLEPHRINE HCL 10 MG/ML IJ SOLN
INTRAMUSCULAR | Status: AC
  Filled 2017-01-21: qty 4

## 2017-01-21 MED ORDER — HEPARIN BOLUS VIA INFUSION
4000.0000 [IU] | Freq: Once | INTRAVENOUS | Status: AC
  Administered 2017-01-21: 4000 [IU] via INTRAVENOUS
  Filled 2017-01-21: qty 4000

## 2017-01-21 MED ORDER — HEPARIN SODIUM (PORCINE) 1000 UNIT/ML IJ SOLN
INTRAMUSCULAR | Status: AC
  Administered 2017-01-21: 2900 [IU] via INTRAVENOUS_CENTRAL
  Filled 2017-01-21: qty 3

## 2017-01-21 MED ORDER — CHLORHEXIDINE GLUCONATE CLOTH 2 % EX PADS
6.0000 | MEDICATED_PAD | Freq: Every day | CUTANEOUS | Status: DC
  Administered 2017-01-21 – 2017-02-01 (×11): 6 via TOPICAL

## 2017-01-21 MED ORDER — EPOETIN ALFA 10000 UNIT/ML IJ SOLN
INTRAMUSCULAR | Status: AC
  Administered 2017-01-21: 8000 [IU]
  Filled 2017-01-21: qty 1

## 2017-01-21 MED ORDER — HEPARIN (PORCINE) IN NACL 100-0.45 UNIT/ML-% IJ SOLN
2600.0000 [IU]/h | INTRAMUSCULAR | Status: DC
  Administered 2017-01-21: 1400 [IU]/h via INTRAVENOUS
  Administered 2017-01-22: 2000 [IU]/h via INTRAVENOUS
  Administered 2017-01-22: 1700 [IU]/h via INTRAVENOUS
  Administered 2017-01-23: 2400 [IU]/h via INTRAVENOUS
  Administered 2017-01-24 (×2): 2700 [IU]/h via INTRAVENOUS
  Administered 2017-01-25 (×2): 2600 [IU]/h via INTRAVENOUS
  Filled 2017-01-21 (×11): qty 250

## 2017-01-21 NOTE — Progress Notes (Signed)
I have reviewed echo today  Pt has severe RV dilitation with moderate RV dysfunction  LVEF is normal  There is septal flattening  PAP is 85. All consistent with cor pulmonale.  Looking at echo report from last summer this was present then as well  Report from 2012 RV was reported normal  Images from both studies cannot be reviewed at present   This goes along with possible PE in the past and it explains some of hemodynamics  Pt still on pressors Recomm: For now would place on heparin with close f/u of H/H Would get CMET and Coox (through central line) She has a contrast allergy  Would recomm CT angio to eval for PE  All of above can explain troponin bump Dorris Carnes

## 2017-01-21 NOTE — Progress Notes (Signed)
ANTICOAGULATION CONSULT NOTE - Initial Consult  Pharmacy Consult for heparin Indication: cor pulmonale, r/o PE  Allergies  Allergen Reactions  . Contrast Media [Iodinated Diagnostic Agents] Anaphylaxis, Hives, Swelling and Other (See Comments)    Dye for cardiac cath. Tongue swells  . Pneumococcal Vaccines Swelling    Turns skin black, and bodily swelling  . Shellfish Allergy Itching, Swelling and Other (See Comments)    Facial swelling - Pt able to eat now 11/25/16  . Vancomycin Nausea And Vomiting and Other (See Comments)    Infusion "made me feel like I was dying" had to be readmitted to hospital    Patient Measurements: Height: _0  (165.1 cm) Weight: (!) 317 lb 14.5 oz (144.2 kg) IBW/kg (Calculated) : 57 Heparin Dosing Weight: 93 kg  Vital Signs: Temp: 97.8 F (36.6 C) (02/02 1530) Temp Source: Axillary (02/02 1530) BP: 125/74 (02/02 1900) Pulse Rate: 94 (02/02 1900)  Labs:  Recent Labs  01/18/17 2214 01/19/17 0212  01/20/17 0813 01/20/17 1333 01/20/17 1944 01/21/17 0441 01/21/17 0800  HGB  --  9.6*  < > 9.9*  --   --  10.2* 10.3*  HCT  --  29.1*  < > 29.9*  --   --  30.4* 30.5*  PLT  --  121*  < > 104*  --   --  94* 87*  APTT 28  --   --   --   --   --   --   --   LABPROT 13.5  --   --   --   --   --   --   --   INR 1.03  --   --   --   --   --   --   --   CREATININE  --  4.84*  --   --   --   --  5.47*  --   TROPONINI  --   --   < > 0.21* 0.97* 3.57*  --  5.31*  < > = values in this interval not displayed.  Estimated Creatinine Clearance: 17.9 mL/min (by C-G formula based on SCr of 5.47 mg/dL (H)).   Medical History: Past Medical History:  Diagnosis Date  . Anxiety   . Asthma    as a child  . Cancer ([place])    thyroid  . Cellulitis   . CHF (congestive heart failure) ([place])   . Chiari malformation    s/p surgery  . Chiari malformation   . Chronic pain   . Complication of anesthesia 11/28/15   Resp arrest after  conscious  sedation  .  Constipation   . Coronary artery disease    40-50% mid LAD 04/29/09, Medical tx. (Dr. Gwenlyn Found)  . Diabetes mellitus    Type II  . Fibromyalgia   . History of blood transfusion    hemorrage duinrg pregancy  . Hx of echocardiogram 10/2011   EF 55-60%  . Hypercholesterolemia   . Hypertension   . Lymph edema   . Obesity hypoventilation syndrome ([place])   . On home O2   . Pneumonia    in past  . Renal disorder    Pt started dialysis in Dec.2016  . S/P colonoscopy 05/26/2007   Dr. Laural Golden sigmoid diverticulosis random biopsies benign  . S/P endoscopy 05/01/2009   Dr. Penelope Coop pill-induced esophageal ulcerations distal to midesophagus, 2 small ulcers in the antrum of the stomach  . Shortness of breath dyspnea    with any exertion  or if heart rate  is irregular while on dialysis  . Sleep apnea     Medications:  Prescriptions Prior to Admission  Medication Sig Dispense Refill Last Dose  . acetaminophen (TYLENOL) 325 MG tablet Take 650 mg by mouth every 6 (six) hours as needed for moderate pain or fever.    01/17/2017 at Unknown time  . albuterol (PROVENTIL) (2.5 MG/3ML) 0.083% nebulizer solution Take 2.5 mg by nebulization every 6 (six) hours as needed for wheezing or shortness of breath.   Past Month at Unknown time  . ALPRAZolam (XANAX) 0.5 MG tablet Take 0.5 mg by mouth 3 (three) times daily as needed for anxiety or sleep.    01/17/2017 at Unknown time  . amLODipine (NORVASC) 10 MG tablet Take 0.5 tablets (5 mg total) by mouth daily. 15 tablet 12 01/17/2017 at Unknown time  . cinacalcet (SENSIPAR) 30 MG tablet Take 30 mg by mouth daily.   01/17/2017 at Unknown time  . diphenhydrAMINE (BENADRYL) 25 mg capsule Take 25 mg by mouth every 6 (six) hours as needed for allergies.   unknown  . Insulin Glargine (LANTUS SOLOSTAR) 100 UNIT/ML Solostar Pen Inject 10 Units into the skin daily at 10 pm. 15 mL 11 01/17/2017 at Unknown time  . metoprolol (LOPRESSOR) 50 MG tablet Take 0.5 tablets (25 mg total) by  mouth 2 (two) times daily. 30 tablet 12 01/17/2017 at Unknown time  . multivitamin (RENA-VIT) TABS tablet Take 1 tablet by mouth daily.   01/17/2017 at Unknown time  . Oxycodone HCl 10 MG TABS Take 1 tablet (10 mg total) by mouth 3 (three) times daily as needed (pain). (Patient taking differently: Take 10 mg by mouth 4 (four) times daily as needed (pain). ) 15 tablet 0 unknown  . sevelamer carbonate (RENVELA) 800 MG tablet Take 1,600-3,200 mg by mouth See admin instructions. Take 3200 mg by mouth 3 times daily with meals and take 1600 mg by mouth with snacks.   01/17/2017 at Unknown time  . doxycycline (VIBRAMYCIN) 50 MG capsule Take 2 capsules (100 mg total) by mouth 2 (two) times daily. (Patient not taking: Reported on 01/18/2017) 20 capsule 0 Not Taking at Unknown time  . predniSONE (STERAPRED UNI-PAK 21 TAB) 10 MG (21) TBPK tablet Take by package instructions (Patient not taking: Reported on 01/18/2017) 21 tablet 0 Not Taking at Unknown time    Assessment: 51 yo lady to start heparin for cor pulmonale, r/o PE. Goal of Therapy:  Heparin level 0.3-0.7 units/ml Monitor platelets by anticoagulation protocol: Yes   Plan:  Heparin bolus 4000 units and drip at 1400 units/hr Check heparin level in ~ 8 hours Daily HL and CBC Monitor for bleeding complications  Thanks for allowing pharmacy to be a part of this patient's care.  Excell Seltzer, PharmD Clinical Pharmacist  01/21/2017,7:14 PM

## 2017-01-21 NOTE — Progress Notes (Signed)
Subjective: Interval History: Her cough is much better. Since she is producing some sputum. She has some nausea but no vomiting. Overall patient seems to be feeling much better since yesterday.   Objective: Vital signs in last 24 hours: Temp:  [98.1 F (36.7 C)-99.6 F (37.6 C)] 98.4 F (36.9 C) (02/02 0400) Pulse Rate:  [89-103] 89 (02/01 1845) Resp:  [0-27] 19 (02/01 1845) BP: (71-136)/(40-93) 106/63 (02/01 2115) SpO2:  [95 %-100 %] 100 % (02/01 1952) Weight:  [144.2 kg (317 lb 14.5 oz)] 144.2 kg (317 lb 14.5 oz) (02/02 0500) Weight change: 0.9 kg (1 lb 15.7 oz)  Intake/Output from previous day: 02/01 0701 - 02/02 0700 In: 1826.7 [P.O.:480; I.V.:755; IV Piggyback:591.7] Out: -  Intake/Output this shift: No intake/output data recorded.  General appearance: alert, cooperative and no distress Resp: diminished breath sounds anterior - bilateral and wheezes bilaterally Cardio: regular rate and rhythm Extremities: edema Patient with venous stasis and chronic leg edema  Lab Results:  Recent Labs  01/20/17 0813 01/21/17 0441  WBC 12.7* 17.2*  HGB 9.9* 10.2*  HCT 29.9* 30.4*  PLT 104* 94*   BMET:   Recent Labs  01/19/17 0212 01/21/17 0441  NA 135 132*  K 4.4 4.8  CL 104 98*  CO2 18* 20*  GLUCOSE 193* 153*  BUN 58* 51*  CREATININE 4.84* 5.47*  CALCIUM 7.1* 8.0*   No results for input(s): PTH in the last 72 hours. Iron Studies: No results for input(s): IRON, TIBC, TRANSFERRIN, FERRITIN in the last 72 hours.  Studies/Results: Dg Chest Port 1 View  Result Date: 01/20/2017 CLINICAL DATA:  Chest pain. EXAM: PORTABLE CHEST 1 VIEW COMPARISON:  01/19/2017. FINDINGS: Right IJ line noted in stable position. Cardiomegaly with bilateral pulmonary interstitial prominence. Findings consistent with congestive heart failure. Basilar atelectasis. Small left pleural effusion cannot be excluded. No pneumothorax. IMPRESSION: 1. Right IJ line stable position. 2. Cardiomegaly with mild  bilateral from interstitial prominence consistent congestive heart failure. Small left pleural effusion cannot be excluded. 2. Low lung volumes with basilar atelectasis Electronically Signed   By: Marcello Moores  Register   On: 01/20/2017 09:07   Dg Chest Port 1 View  Result Date: 01/19/2017 CLINICAL DATA:  Status post central line placement EXAM: PORTABLE CHEST 1 VIEW COMPARISON:  01/18/2017 FINDINGS: Right jugular line is noted with the catheter tip in the mid to distal superior vena cava. No pneumothorax is noted. The lungs are clear. The cardiac shadow is stable. IMPRESSION: No pneumothorax following right jugular central line placement. Electronically Signed   By: Inez Catalina M.D.   On: 01/19/2017 12:11    I have reviewed the patient's current medications.  Assessment/Plan: Problem #1 sepsis: Presently patient with fever, elevated white blood cell count. Blood culture is growing Serratia and Enterobacter. Patient is on antibiotics. presently her white blood cell count still remains high. Patient presently on norepinephrine and dose has been decreased. Her blood pressure seems to be stable.  Problem #2 end-stage renal disease: She is status post hemodialysis on Wednesday. Problem #3 anemia: Her hemoglobin is with in our target goal. Problem #4 fluid management: except cough patient doesn't have any difficulty breathing.   Problem #5 metabolic bone disease: Calcium is in range but her phosphorus is high. Since patient is not eating and has nausea she is not taking her binders or regular basis. Problem # 6 sleep apnea Problem #7 morbid obesity Problem #8 diabetes Plan: We'll continue his antibiotics We'll check her CBC and renal panel in  the morning. We'll make arrangements for dialysis today. We'll dialyze for 4 hours and remove 2 L. Systolic blood pressure remains stable.   LOS: 3 days   Nova Evett S 01/21/2017,8:19 AM

## 2017-01-21 NOTE — Progress Notes (Signed)
*  PRELIMINARY RESULTS* Echocardiogram 2D Echocardiogram has been performed.  [name] 01/21/2017, 5:20 PM

## 2017-01-21 NOTE — Progress Notes (Signed)
Progress Note  Patient Name: [name] Date of Encounter: 01/21/2017  Primary Cardiologist: New    Subjective   Just waking up  No CP  Breathing is OK  Leg hurts    Inpatient Medications    Scheduled Meds: . ceFEPime (MAXIPIME) IV  2 g Intravenous Q M,W,F-HD  . Chlorhexidine Gluconate Cloth  6 each Topical Daily  . epoetin (EPOGEN/PROCRIT) injection  8,000 Units Intravenous Q M,W,F-HD  . insulin aspart  0-9 Units Subcutaneous TID WC  . levofloxacin (LEVAQUIN) IV  750 mg Intravenous Once   Followed by  . levofloxacin (LEVAQUIN) IV  500 mg Intravenous Q48H  . mouth rinse  15 mL Mouth Rinse BID  . oseltamivir  30 mg Oral QHS  . sodium chloride flush  10-40 mL Intracatheter Q12H   Continuous Infusions: . phenylephrine ([name]-SYNEPHRINE) Adult infusion 110 mcg/min (01/21/17 0600)   PRN Meds: sodium chloride, sodium chloride, acetaminophen, albuterol, ALPRAZolam, alteplase, guaiFENesin-dextromethorphan, heparin, heparin, levalbuterol, lidocaine (PF), lidocaine-prilocaine, ondansetron **OR** ondansetron (ZOFRAN) IV, oxyCODONE, pentafluoroprop-tetrafluoroeth, sodium chloride flush   Vital Signs    Vitals:   01/21/17 0915 01/21/17 0930 01/21/17 0945 01/21/17 1000  BP: 104/73 113/66 110/63 104/68  Pulse: 81 81 80 80  Resp: _0 Temp:      TempSrc:      SpO2: 97% 98% 97% 99%  Weight:      Height:        Intake/Output Summary (Last 24 hours) at 01/21/17 1039 Last data filed at 01/20/17 1800  Gross per 24 hour  Intake           1586.7 ml  Output                0 ml  Net           1586.7 ml    I/O 203 negative  Filed Weights   01/19/17 1415 01/20/17 0500 01/21/17 0500  Weight: (!) 315 lb 14.7 oz (143.3 kg) (!) 313 lb 11.4 oz (142.3 kg) (!) 317 lb 14.5 oz (144.2 kg)    Telemetry    SR    ECG    None  Physical Exam   GEN: No acute distress.   Neck: JVP difficult to assess   Cardiac: RRR, no murmurs, rubs, or gallops.  Respiratory: Clear to  auscultation bilaterally.  MS:  Signif lymphedema L leg  Foot especially  Tender  R leg with 2+ edema  .    Labs    Chemistry Recent Labs Lab 01/18/17 1534 01/19/17 0212 01/21/17 0441  NA 137 135 132*  K 3.8 4.4 4.8  CL 98* 104 98*  CO2 25 18* 20*  GLUCOSE 143* 193* 153*  BUN 59* 58* 51*  CREATININE 4.86* 4.84* 5.47*  CALCIUM 8.8* 7.1* 8.0*  PROT 7.5  --   --   ALBUMIN 3.8  --  2.9*  AST 19  --   --   ALT 20  --   --   ALKPHOS 112  --   --   BILITOT 0.6  --   --   GFRNONAA 10* 10* 8*  GFRAA 11* 11* 10*  ANIONGAP _1 Hematology Recent Labs Lab 01/20/17 0813 01/21/17 0441 01/21/17 0800  WBC 12.7* 17.2* 16.2*  RBC 3.08* 3.15* 3.18*  HGB 9.9* 10.2* 10.3*  HCT 29.9* 30.4* 30.5*  MCV 97.1 96.5 95.9  MCH 32.1 32.4 32.4  MCHC 33.1 33.6 33.8  RDW 15.1  15.2 15.1  PLT 104* 94* 87*    Cardiac Enzymes Recent Labs Lab 01/20/17 0813 01/20/17 1333 01/20/17 1944 01/21/17 0800  TROPONINI 0.21* 0.97* 3.57* 5.31*   No results for input(s): TROPIPOC in the last 168 hours.   BNPNo results for input(s): BNP, PROBNP in the last 168 hours.   DDimer No results for input(s): DDIMER in the last 168 hours.   Radiology    Dg Chest Port 1 View  Result Date: 01/20/2017 CLINICAL DATA:  Chest pain. EXAM: PORTABLE CHEST 1 VIEW COMPARISON:  01/19/2017. FINDINGS: Right IJ line noted in stable position. Cardiomegaly with bilateral pulmonary interstitial prominence. Findings consistent with congestive heart failure. Basilar atelectasis. Small left pleural effusion cannot be excluded. No pneumothorax. IMPRESSION: 1. Right IJ line stable position. 2. Cardiomegaly with mild bilateral from interstitial prominence consistent congestive heart failure. Small left pleural effusion cannot be excluded. 2. Low lung volumes with basilar atelectasis Electronically Signed   By: Marcello Moores  Register   On: 01/20/2017 09:07   Dg Chest Port 1 View  Result Date: 01/19/2017 CLINICAL DATA:  Status  post central line placement EXAM: PORTABLE CHEST 1 VIEW COMPARISON:  01/18/2017 FINDINGS: Right jugular line is noted with the catheter tip in the mid to distal superior vena cava. No pneumothorax is noted. The lungs are clear. The cardiac shadow is stable. IMPRESSION: No pneumothorax following right jugular central line placement. Electronically Signed   By: Inez Catalina M.D.   On: 01/19/2017 12:11    Cardiac Studies   Echo    Patient Profile     51 y.o. female ESRD on hemodialysis, chronic lymphedema, cellulitis, OSA, chronic hypoxia  Admitted woth L leg pain and fever  Dx cellulitis and sepsis.    Assessment & Plan    1  Elevated troponin  I am not convinced of acitve ischemia  My represent strain in setting of sepsis.  Echo pending  If LVEF is normal I would not recomm routine nuclear stress testing    2  Sepsis  ON ABX    3  ESRD  On dialysis    4  Chronic diastolic XCHF  Volume managed with dialysis .   Signed, Dorris Carnes, MD  01/21/2017, 10:39 AM

## 2017-01-21 NOTE — Progress Notes (Signed)
CRITICAL VALUE ALERT  Critical value received:  Troponin 5.31 Date of notification:  01/21/2017  Time of notification:  0850  Nurse who received alert:  Larene Beach  MD notified (1st page):  [name] Time of first page:  [phone]

## 2017-01-21 NOTE — Progress Notes (Signed)
Subjective: She says she feels a little better. She complains of aching all over. She is coughing nonproductively. No other new complaints. She is not having chest pain. Her leg feels better. Her blood pressure is better.  Objective: Vital signs in last 24 hours: Temp:  [97.8 F (36.6 C)-99.6 F (37.6 C)] 98.4 F (36.9 C) (02/02 0400) Pulse Rate:  [89-103] 89 (02/01 1845) Resp:  [0-27] 19 (02/01 1845) BP: (71-136)/(40-93) 106/63 (02/01 2115) SpO2:  [95 %-100 %] 100 % (02/01 1952) Weight:  [144.2 kg (317 lb 14.5 oz)] 144.2 kg (317 lb 14.5 oz) (02/02 0500) Weight change: 0.9 kg (1 lb 15.7 oz) Last BM Date: 01/18/17  Intake/Output from previous day: 02/01 0701 - 02/02 0700 In: 1826.7 [P.O.:480; I.V.:755; IV Piggyback:591.7] Out: -   PHYSICAL EXAM General appearance: alert, cooperative, moderate distress and morbidly obese Resp: rhonchi bilaterally Cardio: regular rate and rhythm, S1, S2 normal, no murmur, click, rub or gallop GI: soft, non-tender; bowel sounds normal; no masses,  no organomegaly Extremities: She has chronic lymphedema of both legs. Her skin on her left leg is less erythematous and is not as warm Mucous membranes are moist.  Lab Results:  Results for orders placed or performed during the hospital encounter of 01/18/17 (from the past 48 hour(s))  Glucose, capillary     Status: Abnormal   Collection Time: 01/19/17  7:56 AM  Result Value Ref Range   Glucose-Capillary 224 (H) 65 - 99 mg/dL  Lactic acid, plasma     Status: Abnormal   Collection Time: 01/19/17  9:40 AM  Result Value Ref Range   Lactic Acid, Venous 2.9 (HH) 0.5 - 1.9 mmol/L    Comment: CRITICAL RESULT CALLED TO, READ BACK BY AND VERIFIED WITH: MOTLEY,J AT 10:20AM ON 01/19/17 BY FESTERMAN,C   CBC with Differential/Platelet     Status: Abnormal   Collection Time: 01/19/17  9:40 AM  Result Value Ref Range   WBC 11.9 (H) 4.0 - 10.5 K/uL    Comment: INCREASED BANDS (>20% BANDS)   RBC 3.04 (L) 3.87 -  5.11 MIL/uL   Hemoglobin 9.6 (L) 12.0 - 15.0 g/dL   HCT 29.0 (L) 36.0 - 46.0 %   MCV 95.4 78.0 - 100.0 fL   MCH 31.6 26.0 - 34.0 pg   MCHC 33.1 30.0 - 36.0 g/dL   RDW 15.1 11.5 - 15.5 %   Platelets 146 (L) 150 - 400 K/uL   Neutrophils Relative % 91 %   Neutro Abs 10.9 (H) 1.7 - 7.7 K/uL   Lymphocytes Relative 6 %   Lymphs Abs 0.7 0.7 - 4.0 K/uL   Monocytes Relative 3 %   Monocytes Absolute 0.3 0.1 - 1.0 K/uL   Eosinophils Relative 0 %   Eosinophils Absolute 0.0 0.0 - 0.7 K/uL   Basophils Relative 0 %   Basophils Absolute 0.0 0.0 - 0.1 K/uL  Glucose, capillary     Status: Abnormal   Collection Time: 01/19/17 11:55 AM  Result Value Ref Range   Glucose-Capillary 169 (H) 65 - 99 mg/dL  Hepatitis B surface antigen     Status: None   Collection Time: 01/19/17  2:37 PM  Result Value Ref Range   Hepatitis B Surface Ag Negative Negative    Comment: (NOTE) Performed At: [facility] [place], [place] [id number] Lindon Romp MD [id number]   Hepatitis B surface antibody     Status: Abnormal   Collection Time: 01/19/17  2:38 PM  Result Value  Ref Range   Hepatitis B-Post <3.1 (L) Immunity>9.9 mIU/mL    Comment: (NOTE)  Status of Immunity                     Anti-HBs Level  ------------------                     -------------- Inconsistent with Immunity                   0.0 - 9.9 Consistent with Immunity                          >9.9 Performed At: [facility] [place], [place] [id number] Lindon Romp MD [id number]   Hepatitis B core antibody, total     Status: None   Collection Time: 01/19/17  2:38 PM  Result Value Ref Range   Hep B Core Total Ab Negative Negative    Comment: (NOTE) Performed At: [facility] [address] [place], [place] [id number] Lindon Romp MD SX:[id number]   Glucose, capillary     Status: Abnormal   Collection Time: 01/19/17  3:52 PM  Result Value Ref Range   Glucose-Capillary 129  (H) 65 - 99 mg/dL   Comment 1 Notify RN    Comment 2 Document in Chart   Glucose, capillary     Status: Abnormal   Collection Time: 01/19/17 10:08 PM  Result Value Ref Range   Glucose-Capillary 117 (H) 65 - 99 mg/dL  Glucose, capillary     Status: Abnormal   Collection Time: 01/20/17  8:01 AM  Result Value Ref Range   Glucose-Capillary 107 (H) 65 - 99 mg/dL  Lactic acid, plasma     Status: Abnormal   Collection Time: 01/20/17  8:10 AM  Result Value Ref Range   Lactic Acid, Venous 6.3 (HH) 0.5 - 1.9 mmol/L    Comment: CRITICAL RESULT CALLED TO, READ BACK BY AND VERIFIED WITH: MCDANIEL,M AT 8:55AM ON 01/20/17 BY FESTERMAN,C   CBC with Differential/Platelet     Status: Abnormal   Collection Time: 01/20/17  8:13 AM  Result Value Ref Range   WBC 12.7 (H) 4.0 - 10.5 K/uL   RBC 3.08 (L) 3.87 - 5.11 MIL/uL   Hemoglobin 9.9 (L) 12.0 - 15.0 g/dL   HCT 29.9 (L) 36.0 - 46.0 %   MCV 97.1 78.0 - 100.0 fL   MCH 32.1 26.0 - 34.0 pg   MCHC 33.1 30.0 - 36.0 g/dL   RDW 15.1 11.5 - 15.5 %   Platelets 104 (L) 150 - 400 K/uL    Comment: SPECIMEN CHECKED FOR CLOTS PLATELET COUNT CONFIRMED BY SMEAR    Neutrophils Relative % 87 %   Neutro Abs 11.1 (H) 1.7 - 7.7 K/uL   Lymphocytes Relative 6 %   Lymphs Abs 0.8 0.7 - 4.0 K/uL   Monocytes Relative 6 %   Monocytes Absolute 0.8 0.1 - 1.0 K/uL   Eosinophils Relative 0 %   Eosinophils Absolute 0.0 0.0 - 0.7 K/uL   Basophils Relative 0 %   Basophils Absolute 0.0 0.0 - 0.1 K/uL  Troponin I (q 6hr x 3)     Status: Abnormal   Collection Time: 01/20/17  8:13 AM  Result Value Ref Range   Troponin I 0.21 (HH) <0.03 ng/mL    Comment: CRITICAL RESULT CALLED TO, READ BACK BY AND VERIFIED WITH: DAVIS,K AT 9:05AM ON 01/20/17 BY Derrill Memo  Glucose, capillary     Status: Abnormal   Collection Time: 01/20/17 11:47 AM  Result Value Ref Range   Glucose-Capillary 123 (H) 65 - 99 mg/dL   Comment 1 Notify RN    Comment 2 Document in Chart   Troponin I (q 6hr  x 3)     Status: Abnormal   Collection Time: 01/20/17  1:33 PM  Result Value Ref Range   Troponin I 0.97 (HH) <0.03 ng/mL    Comment: CRITICAL VALUE NOTED.  VALUE IS CONSISTENT WITH PREVIOUSLY REPORTED AND CALLED VALUE.  Lactic acid, plasma     Status: Abnormal   Collection Time: 01/20/17  3:41 PM  Result Value Ref Range   Lactic Acid, Venous 8.2 (HH) 0.5 - 1.9 mmol/L    Comment: CRITICAL RESULT CALLED TO, READ BACK BY AND VERIFIED WITH: HOWARD,C ON 01/20/17 AT 1640 BY LOY,C   Glucose, capillary     Status: Abnormal   Collection Time: 01/20/17  4:24 PM  Result Value Ref Range   Glucose-Capillary 126 (H) 65 - 99 mg/dL   Comment 1 Notify RN    Comment 2 Document in Chart   Troponin I (q 6hr x 3)     Status: Abnormal   Collection Time: 01/20/17  7:44 PM  Result Value Ref Range   Troponin I 3.57 (HH) <0.03 ng/mL    Comment: CRITICAL VALUE NOTED.  VALUE IS CONSISTENT WITH PREVIOUSLY REPORTED AND CALLED VALUE.  Lactic acid, plasma     Status: Abnormal   Collection Time: 01/20/17  7:44 PM  Result Value Ref Range   Lactic Acid, Venous 6.9 (HH) 0.5 - 1.9 mmol/L    Comment: CRITICAL RESULT CALLED TO, READ BACK BY AND VERIFIED WITH: WAGNOR,R AT 2030 ON 01/20/2017 BY ISLEY,B   Glucose, capillary     Status: Abnormal   Collection Time: 01/20/17  9:09 PM  Result Value Ref Range   Glucose-Capillary 146 (H) 65 - 99 mg/dL  CBC     Status: Abnormal   Collection Time: 01/21/17  4:41 AM  Result Value Ref Range   WBC 17.2 (H) 4.0 - 10.5 K/uL   RBC 3.15 (L) 3.87 - 5.11 MIL/uL   Hemoglobin 10.2 (L) 12.0 - 15.0 g/dL   HCT 30.4 (L) 36.0 - 46.0 %   MCV 96.5 78.0 - 100.0 fL   MCH 32.4 26.0 - 34.0 pg   MCHC 33.6 30.0 - 36.0 g/dL   RDW 15.2 11.5 - 15.5 %   Platelets 94 (L) 150 - 400 K/uL    Comment: SPECIMEN CHECKED FOR CLOTS CONSISTENT WITH PREVIOUS RESULT   Renal function panel     Status: Abnormal   Collection Time: 01/21/17  4:41 AM  Result Value Ref Range   Sodium 132 (L) 135 - 145 mmol/L    Potassium 4.8 3.5 - 5.1 mmol/L   Chloride 98 (L) 101 - 111 mmol/L   CO2 20 (L) 22 - 32 mmol/L   Glucose, Bld 153 (H) 65 - 99 mg/dL   BUN 51 (H) 6 - 20 mg/dL   Creatinine, Ser 5.47 (H) 0.44 - 1.00 mg/dL   Calcium 8.0 (L) 8.9 - 10.3 mg/dL   Phosphorus 7.7 (H) 2.5 - 4.6 mg/dL   Albumin 2.9 (L) 3.5 - 5.0 g/dL   GFR calc non Af Amer 8 (L) >60 mL/min   GFR calc Af Amer 10 (L) >60 mL/min    Comment: (NOTE) The eGFR has been calculated using the CKD EPI equation. This calculation  has not been validated in all clinical situations. eGFR's persistently <60 mL/min signify possible Chronic Kidney Disease.    Anion gap 14 5 - 15  Glucose, capillary     Status: Abnormal   Collection Time: 01/21/17  7:25 AM  Result Value Ref Range   Glucose-Capillary 135 (H) 65 - 99 mg/dL    ABGS No results for input(s): PHART, PO2ART, TCO2, HCO3 in the last 72 hours.  Invalid input(s): PCO2 CULTURES Recent Results (from the past 240 hour(s))  Culture, blood (routine x 2)     Status: Abnormal (Preliminary result)   Collection Time: 01/18/17  3:41 PM  Result Value Ref Range Status   Specimen Description LEFT ANTECUBITAL  Final   Special Requests BOTTLES DRAWN AEROBIC AND ANAEROBIC [facility] EACH  Final   Culture  Setup Time   Final    GRAM NEGATIVE RODS AEROBIC AND ANAEROBIC BOTTLES Gram Stain Report Called to,Read Back By and Verified With: HOWARD,C AT 0725 BY HUFFINES,S ON 01/19/17    Culture (A)  Final    SERRATIA MARCESCENS SUSCEPTIBILITIES TO FOLLOW Performed at [facility], [address] [place], [place] [address]    Report Status PENDING  Incomplete  Culture, blood (routine x 2)     Status: Abnormal (Preliminary result)   Collection Time: 01/18/17  3:41 PM  Result Value Ref Range Status   Specimen Description BLOOD LEFT HAND  Final   Special Requests BOTTLES DRAWN AEROBIC AND ANAEROBIC 6CC EACH  Final   Culture  Setup Time   Final    GRAM NEGATIVE RODS AEROBIC AND ANAEROBIC BOTTLES Gram  Stain Report Called to,Read Back By and Verified With: HOWARD,C AT 0725 BY HUFFINES,S ON 01/19/17. CRITICAL RESULT CALLED TO, READ BACK BY AND VERIFIED WITHJuel Burrow PHARMD 1535 01/19/17 A BROWNING Performed at [facility], [place] [address], [place], [place] [address]    Culture SERRATIA MARCESCENS (A)  Final   Report Status PENDING  Incomplete  Blood Culture ID Panel (Reflexed)     Status: Abnormal   Collection Time: 01/18/17  3:41 PM  Result Value Ref Range Status   Enterococcus species NOT DETECTED NOT DETECTED Final   Listeria monocytogenes NOT DETECTED NOT DETECTED Final   Staphylococcus species NOT DETECTED NOT DETECTED Final   Staphylococcus aureus NOT DETECTED NOT DETECTED Final   Streptococcus species NOT DETECTED NOT DETECTED Final   Streptococcus agalactiae NOT DETECTED NOT DETECTED Final   Streptococcus pneumoniae NOT DETECTED NOT DETECTED Final   Streptococcus pyogenes NOT DETECTED NOT DETECTED Final   Acinetobacter baumannii NOT DETECTED NOT DETECTED Final   Enterobacteriaceae species DETECTED (A) NOT DETECTED Final    Comment: Enterobacteriaceae represent a large family of gram-negative bacteria, not a single organism. CRITICAL RESULT CALLED TO, READ BACK BY AND VERIFIED WITH: S HALL PHARMD 1535 01/19/17 A BROWNING    Enterobacter cloacae complex NOT DETECTED NOT DETECTED Final   Escherichia coli NOT DETECTED NOT DETECTED Final   Klebsiella oxytoca NOT DETECTED NOT DETECTED Final   Klebsiella pneumoniae NOT DETECTED NOT DETECTED Final   Proteus species NOT DETECTED NOT DETECTED Final   Serratia marcescens DETECTED (A) NOT DETECTED Final    Comment: CRITICAL RESULT CALLED TO, READ BACK BY AND VERIFIED WITH: S HALL PHARMD 1535 01/19/17 A BROWNING    Carbapenem resistance NOT DETECTED NOT DETECTED Final   Haemophilus influenzae NOT DETECTED NOT DETECTED Final   Neisseria meningitidis NOT DETECTED NOT DETECTED Final   Pseudomonas aeruginosa NOT DETECTED  NOT DETECTED  Final   Candida albicans NOT DETECTED NOT DETECTED Final   Candida glabrata NOT DETECTED NOT DETECTED Final   Candida krusei NOT DETECTED NOT DETECTED Final   Candida parapsilosis NOT DETECTED NOT DETECTED Final   Candida tropicalis NOT DETECTED NOT DETECTED Final    Comment: Performed at [facility], [place] [address], [place], [place] [address]  MRSA PCR Screening     Status: None   Collection Time: 01/18/17 10:25 PM  Result Value Ref Range Status   MRSA by PCR NEGATIVE NEGATIVE Final    Comment:        The GeneXpert MRSA Assay (FDA approved for NASAL specimens only), is one component of a comprehensive MRSA colonization surveillance program. It is not intended to diagnose MRSA infection nor to guide or monitor treatment for MRSA infections.    Studies/Results: Dg Chest Port 1 View  Result Date: 01/20/2017 CLINICAL DATA:  Chest pain. EXAM: PORTABLE CHEST 1 VIEW COMPARISON:  01/19/2017. FINDINGS: Right IJ line noted in stable position. Cardiomegaly with bilateral pulmonary interstitial prominence. Findings consistent with congestive heart failure. Basilar atelectasis. Small left pleural effusion cannot be excluded. No pneumothorax. IMPRESSION: 1. Right IJ line stable position. 2. Cardiomegaly with mild bilateral from interstitial prominence consistent congestive heart failure. Small left pleural effusion cannot be excluded. 2. Low lung volumes with basilar atelectasis Electronically Signed   By: Marcello Moores  Register   On: 01/20/2017 09:07   Dg Chest Port 1 View  Result Date: 01/19/2017 CLINICAL DATA:  Status post central line placement EXAM: PORTABLE CHEST 1 VIEW COMPARISON:  01/18/2017 FINDINGS: Right jugular line is noted with the catheter tip in the mid to distal superior vena cava. No pneumothorax is noted. The lungs are clear. The cardiac shadow is stable. IMPRESSION: No pneumothorax following right jugular central line placement. Electronically Signed   By: Inez Catalina M.D.   On:  01/19/2017 12:11    Medications:  Prior to Admission:  Prescriptions Prior to Admission  Medication Sig Dispense Refill Last Dose  . acetaminophen (TYLENOL) 325 MG tablet Take 650 mg by mouth every 6 (six) hours as needed for moderate pain or fever.    01/17/2017 at Unknown time  . albuterol (PROVENTIL) (2.5 MG/3ML) 0.083% nebulizer solution Take 2.5 mg by nebulization every 6 (six) hours as needed for wheezing or shortness of breath.   Past Month at Unknown time  . ALPRAZolam (XANAX) 0.5 MG tablet Take 0.5 mg by mouth 3 (three) times daily as needed for anxiety or sleep.    01/17/2017 at Unknown time  . amLODipine (NORVASC) 10 MG tablet Take 0.5 tablets (5 mg total) by mouth daily. 15 tablet 12 01/17/2017 at Unknown time  . cinacalcet (SENSIPAR) 30 MG tablet Take 30 mg by mouth daily.   01/17/2017 at Unknown time  . diphenhydrAMINE (BENADRYL) 25 mg capsule Take 25 mg by mouth every 6 (six) hours as needed for allergies.   unknown  . Insulin Glargine (LANTUS SOLOSTAR) 100 UNIT/ML Solostar Pen Inject 10 Units into the skin daily at 10 pm. 15 mL 11 01/17/2017 at Unknown time  . metoprolol (LOPRESSOR) 50 MG tablet Take 0.5 tablets (25 mg total) by mouth 2 (two) times daily. 30 tablet 12 01/17/2017 at Unknown time  . multivitamin (RENA-VIT) TABS tablet Take 1 tablet by mouth daily.   01/17/2017 at Unknown time  . Oxycodone HCl 10 MG TABS Take 1 tablet (10 mg total) by mouth 3 (three) times daily as needed (pain). (  Patient taking differently: Take 10 mg by mouth 4 (four) times daily as needed (pain). ) 15 tablet 0 unknown  . sevelamer carbonate (RENVELA) 800 MG tablet Take 1,600-3,200 mg by mouth See admin instructions. Take 3200 mg by mouth 3 times daily with meals and take 1600 mg by mouth with snacks.   01/17/2017 at Unknown time  . doxycycline (VIBRAMYCIN) 50 MG capsule Take 2 capsules (100 mg total) by mouth 2 (two) times daily. (Patient not taking: Reported on 01/18/2017) 20 capsule 0 Not Taking at Unknown  time  . predniSONE (STERAPRED UNI-PAK 21 TAB) 10 MG (21) TBPK tablet Take by package instructions (Patient not taking: Reported on 01/18/2017) 21 tablet 0 Not Taking at Unknown time   Scheduled: . ceFEPime (MAXIPIME) IV  2 g Intravenous Q M,W,F-HD  . epoetin (EPOGEN/PROCRIT) injection  8,000 Units Intravenous Q M,W,F-HD  . insulin aspart  0-9 Units Subcutaneous TID WC  . levofloxacin (LEVAQUIN) IV  750 mg Intravenous Once   Followed by  . levofloxacin (LEVAQUIN) IV  500 mg Intravenous Q48H  . mouth rinse  15 mL Mouth Rinse BID  . oseltamivir  30 mg Oral QHS   Continuous: . phenylephrine ([name]-SYNEPHRINE) Adult infusion 110 mcg/min (01/21/17 0600)   SWN:IOEVOJ chloride, sodium chloride, acetaminophen, albuterol, ALPRAZolam, alteplase, guaiFENesin-dextromethorphan, heparin, heparin, levalbuterol, lidocaine (PF), lidocaine-prilocaine, ondansetron **OR** ondansetron (ZOFRAN) IV, oxyCODONE, pentafluoroprop-tetrafluoroeth  Assesment: She was admitted with cellulitis of her leg and sepsis. She is growing Serratia in her blood but we don't have sensitivities yet. Lactic acid level is still very high. Pending this morning. She has required pressor support. She had chest pain yesterday this was after she was started on pressors and her troponin level is up. Cardiology consultation has been obtained and this is felt to be demand ischemia. At baseline she has morbid obesity chronic lymphedema of her legs chronic diastolic heart failure managed with dialysis and end-stage renal disease on dialysis. Principal Problem:   Fever Active Problems:   Diabetes mellitus ([place])   Morbid obesity ([place])   Obesity hypoventilation syndrome (H[facility])   Chronic diastolic CHF (congestive heart failure) (H[facility])   Chronic respiratory failure with hypoxia (H[facility])   ESRD on dialysis ([place])   Flu-like symptoms    Plan: Continue treatments. Recheck troponin and lactate. Try to taper down on the pressors.    LOS: 3 days    Charisse Wendell L 01/21/2017, 7:44 AM

## 2017-01-21 NOTE — Progress Notes (Signed)
CRITICAL VALUE ALERT  Critical value received:  Lactic 2.8  Date of notification:  01/21/2017  Time of notification:  0845  Nurse who received alert:  Lysbeth Galas  MD notified (1st page):  [name]  Time of first page:  [phone]

## 2017-01-21 NOTE — Care Management Note (Signed)
Case Management Note  Patient Details  Name: [name] MRN: [id number] Date of Birth: Jan 04, 1966  Subjective/Objective:                  Patient adm from home with fever/sepsis/leg cellulitis. She is from home with family. She has RW, cane and bed at home PTA and oxygen at home provided by [facility]. Patient sleeping, husband offering information. Patient is a HD patient, goes to [facility], MWF schedule. No current HH, but has had HH in the past. She may need IV antibiotics when discharged. Husband states she has had IV antibiotics at home in the past and he feels comfortable administering If needed again.   Action/Plan: CM following for needs.   Expected Discharge Date:       01/24/2017          Expected Discharge Plan:  [place]  In-House Referral:  NA  Discharge planning Services  CM Consult  Post Acute Care Choice:    Choice offered to:     DME Arranged:    DME Agency:     HH Arranged:    HH Agency:     Status of Service:  In process, will continue to follow  If discussed at Long Length of Stay Meetings, dates discussed:    Additional Comments:  [name], Lindsay Reading, RN 01/21/2017, 1:57 PM

## 2017-01-21 NOTE — Progress Notes (Signed)
See accompanying note

## 2017-01-21 NOTE — Procedures (Signed)
    HEMODIALYSIS TREATMENT NOTE:  4 hour low-heparin dialysis completed via tortuous left forearm AVF (15g/antegrade). Goal met: 2.2 L removed without interruption in ultrafiltration.  All blood was returned and hemsotasis was achieved within 15 minutes.  Rockwell Alexandria, RN, CDN

## 2017-01-22 ENCOUNTER — Inpatient Hospital Stay (HOSPITAL_COMMUNITY): Payer: Medicare Other

## 2017-01-22 LAB — CBC WITH DIFFERENTIAL/PLATELET
BASOS ABS: 0 10*3/uL (ref 0.0–0.1)
BASOS PCT: 0 %
EOS ABS: 0 10*3/uL (ref 0.0–0.7)
EOS PCT: 0 %
HCT: 29.4 % — ABNORMAL LOW (ref 36.0–46.0)
Hemoglobin: 10 g/dL — ABNORMAL LOW (ref 12.0–15.0)
LYMPHS PCT: 7 %
Lymphs Abs: 0.9 10*3/uL (ref 0.7–4.0)
MCH: 32.5 pg (ref 26.0–34.0)
MCHC: 34 g/dL (ref 30.0–36.0)
MCV: 95.5 fL (ref 78.0–100.0)
MONO ABS: 1.1 10*3/uL — AB (ref 0.1–1.0)
Monocytes Relative: 8 %
Neutro Abs: 11.6 10*3/uL — ABNORMAL HIGH (ref 1.7–7.7)
Neutrophils Relative %: 85 %
PLATELETS: 92 10*3/uL — AB (ref 150–400)
RBC: 3.08 MIL/uL — ABNORMAL LOW (ref 3.87–5.11)
RDW: 15.1 % (ref 11.5–15.5)
WBC: 13.7 10*3/uL — ABNORMAL HIGH (ref 4.0–10.5)

## 2017-01-22 LAB — COMPREHENSIVE METABOLIC PANEL
ALBUMIN: 3.1 g/dL — AB (ref 3.5–5.0)
ALK PHOS: 175 U/L — AB (ref 38–126)
ALT: 2516 U/L — AB (ref 14–54)
ALT: 2202 U/L — ABNORMAL HIGH (ref 14–54)
AST: 2953 U/L — AB (ref 15–41)
AST: 2283 U/L — AB (ref 15–41)
Albumin: 2.9 g/dL — ABNORMAL LOW (ref 3.5–5.0)
Alkaline Phosphatase: 183 U/L — ABNORMAL HIGH (ref 38–126)
Anion gap: 12 (ref 5–15)
Anion gap: 10 (ref 5–15)
BILIRUBIN TOTAL: 3 mg/dL — AB (ref 0.3–1.2)
BUN: 33 mg/dL — ABNORMAL HIGH (ref 6–20)
BUN: 42 mg/dL — AB (ref 6–20)
CALCIUM: 8.4 mg/dL — AB (ref 8.9–10.3)
CHLORIDE: 98 mmol/L — AB (ref 101–111)
CO2: 26 mmol/L (ref 22–32)
CO2: 25 mmol/L (ref 22–32)
CREATININE: 3.85 mg/dL — AB (ref 0.44–1.00)
Calcium: 8.2 mg/dL — ABNORMAL LOW (ref 8.9–10.3)
Chloride: 99 mmol/L — ABNORMAL LOW (ref 101–111)
Creatinine, Ser: 4.62 mg/dL — ABNORMAL HIGH (ref 0.44–1.00)
GFR calc Af Amer: 15 mL/min — ABNORMAL LOW (ref >60)
GFR calc Af Amer: 12 mL/min — ABNORMAL LOW (ref >60)
GFR, EST NON AFRICAN AMERICAN: 13 mL/min — AB (ref >60)
GFR, EST NON AFRICAN AMERICAN: 10 mL/min — AB (ref >60)
GLUCOSE: 125 mg/dL — AB (ref 65–99)
Glucose, Bld: 152 mg/dL — ABNORMAL HIGH (ref 65–99)
POTASSIUM: 4.7 mmol/L (ref 3.5–5.1)
Potassium: 4.2 mmol/L (ref 3.5–5.1)
SODIUM: 133 mmol/L — AB (ref 135–145)
Sodium: 137 mmol/L (ref 135–145)
TOTAL PROTEIN: 6.5 g/dL (ref 6.5–8.1)
Total Bilirubin: 3 mg/dL — ABNORMAL HIGH (ref 0.3–1.2)
Total Protein: 6.4 g/dL — ABNORMAL LOW (ref 6.5–8.1)

## 2017-01-22 LAB — GLUCOSE, CAPILLARY
GLUCOSE-CAPILLARY: 153 mg/dL — AB (ref 65–99)
GLUCOSE-CAPILLARY: 160 mg/dL — AB (ref 65–99)
GLUCOSE-CAPILLARY: 133 mg/dL — AB (ref 65–99)
GLUCOSE-CAPILLARY: 143 mg/dL — AB (ref 65–99)

## 2017-01-22 LAB — COOXEMETRY PANEL
CARBOXYHEMOGLOBIN: 1.2 % (ref 0.5–1.5)
Methemoglobin: 0.8 % (ref 0.0–1.5)
O2 SAT: 72.4 %
Total hemoglobin: 10.7 g/dL — ABNORMAL LOW (ref 12.0–16.0)
Total oxygen content: 10.7 mL/dL — ABNORMAL LOW (ref 15.0–23.0)

## 2017-01-22 LAB — HEPARIN LEVEL (UNFRACTIONATED)

## 2017-01-22 MED ORDER — HEPARIN BOLUS VIA INFUSION
2000.0000 [IU] | Freq: Once | INTRAVENOUS | Status: AC
  Administered 2017-01-22: 2000 [IU] via INTRAVENOUS
  Filled 2017-01-22: qty 2000

## 2017-01-22 MED ORDER — PHENYLEPHRINE HCL 10 MG/ML IJ SOLN
INTRAMUSCULAR | Status: AC
  Filled 2017-01-22: qty 4

## 2017-01-22 MED ORDER — PREDNISONE 20 MG PO TABS
50.0000 mg | ORAL_TABLET | Freq: Four times a day (QID) | ORAL | Status: AC
  Administered 2017-01-23 – 2017-01-24 (×3): 50 mg via ORAL
  Filled 2017-01-22 (×3): qty 2

## 2017-01-22 MED ORDER — LINACLOTIDE 145 MCG PO CAPS
145.0000 ug | ORAL_CAPSULE | Freq: Every day | ORAL | Status: DC
  Administered 2017-01-22 – 2017-01-23 (×2): 145 ug via ORAL
  Filled 2017-01-22 (×3): qty 1

## 2017-01-22 MED ORDER — PREDNISONE 10 MG PO TABS: 50.0000 mg | ORAL_TABLET | Freq: Four times a day (QID) | ORAL | Status: DC

## 2017-01-22 NOTE — Progress Notes (Signed)
Subjective: Interval History: Patient continued to feel better. She doesn't have any nausea or vomiting. Cough has improved.  Objective: Vital signs in last 24 hours: Temp:  [97.7 F (36.5 C)-98.2 F (36.8 C)] 97.8 F (36.6 C) (02/03 0742) Pulse Rate:  [72-104] 78 (02/03 0800) Resp:  [13-33] 15 (02/03 0800) BP: (84-134)/(54-99) 100/67 (02/03 0800) SpO2:  [94 %-100 %] 97 % (02/03 0800) Weight change:   Intake/Output from previous day: 02/02 0701 - 02/03 0700 In: 1199.3 [P.O.:480; I.V.:584.3; IV Piggyback:50] Out: 2200  Intake/Output this shift: Total I/O In: 48.4 [I.V.:48.4] Out: -   General appearance: alert, cooperative and no distress Resp: diminished breath sounds anterior - bilateral and wheezes bilaterally Cardio: regular rate and rhythm Extremities: edema Patient with venous stasis and chronic leg edema  Lab Results:  Recent Labs  01/21/17 0800 01/22/17 0600  WBC 16.2* 13.7*  HGB 10.3* 10.0*  HCT 30.5* 29.4*  PLT 87* 92*   BMET:   Recent Labs  01/21/17 2250 01/22/17 0600  NA 137 133*  K 4.2 4.7  CL 99* 98*  CO2 26 25  GLUCOSE 125* 152*  BUN 33* 42*  CREATININE 3.85* 4.62*  CALCIUM 8.4* 8.2*   No results for input(s): PTH in the last 72 hours. Iron Studies: No results for input(s): IRON, TIBC, TRANSFERRIN, FERRITIN in the last 72 hours.  Studies/Results: No results found.  I have reviewed the patient's current medications.  Assessment/Plan: Problem #1 sepsis: Presently patient with fever, elevated white blood cell count. Blood culture is growing Serratia and Enterobacter. Patient is on antibiotics. Patient is afebrile and her white blood cell count is improving.  Problem #2 end-stage renal disease: She is status post hemodialysis  yesterday. Her potassium is normal and presently she is asymptomatic. Problem #3 anemia: Her hemoglobin is with in our target goal. Patient is on Epogen Problem #4 fluid management: except cough patient doesn't have  any difficulty breathing. Were able to remove about 2.2 L yesterday with dialysis  Problem #5 metabolic bone disease: Calcium is in range but her phosphorus is high. She started on binder. Problem # 6 sleep apnea: Not on CPAP Problem #7 morbid obesity Problem #8 diabetes: Her blood sugar is reasonably controlled Problem #9 elevated LFTs: Improving. Possibly ischemic Plan: We'll continue his antibiotics Patient doesn't require dialysis today We'll check her CBC and renal panel in the morning.    LOS: 4 days   Ajai Terhaar S 01/22/2017,9:08 AM

## 2017-01-22 NOTE — Progress Notes (Addendum)
[place] for heparin Indication: cor pulmonale, r/o PE  Allergies  Allergen Reactions  . Contrast Media [Iodinated Diagnostic Agents] Anaphylaxis, Hives, Swelling and Other (See Comments)    Dye for cardiac cath. Tongue swells  . Pneumococcal Vaccines Swelling    Turns skin black, and bodily swelling  . Shellfish Allergy Itching, Swelling and Other (See Comments)    Facial swelling - Pt able to eat now 11/25/16  . Vancomycin Nausea And Vomiting and Other (See Comments)    Infusion "made me feel like I was dying" had to be readmitted to hospital    Patient Measurements: Height: _0  (165.1 cm) Weight: (!) 317 lb 14.5 oz (144.2 kg) IBW/kg (Calculated) : 57 Heparin Dosing Weight: 93 kg  Vital Signs: Temp: 97.8 F (36.6 C) (02/03 0742) Temp Source: Oral (02/03 0742) BP: 100/67 (02/03 0800) Pulse Rate: 78 (02/03 0800)  Labs:  Recent Labs  01/20/17 1333 01/20/17 1944 01/21/17 0441 01/21/17 0800 01/21/17 2250 01/22/17 0600  HGB  --   --  10.2* 10.3*  --  10.0*  HCT  --   --  30.4* 30.5*  --  29.4*  PLT  --   --  94* 87*  --  92*  HEPARINUNFRC  --   --   --   --   --  <0.10*  CREATININE  --   --  5.47*  --  3.85* 4.62*  TROPONINI 0.97* 3.57*  --  5.31*  --   --     Estimated Creatinine Clearance: 21.1 mL/min (by C-G formula based on SCr of 4.62 mg/dL (H)).   Medical History: Past Medical History:  Diagnosis Date  . Anxiety   . Asthma    as a child  . Cancer ([place])    thyroid  . Cellulitis   . CHF (congestive heart failure) ([place])   . Chiari malformation    s/p surgery  . Chiari malformation   . Chronic pain   . Complication of anesthesia 11/28/15   Resp arrest after  conscious  sedation  . Constipation   . Coronary artery disease    40-50% mid LAD 04/29/09, Medical tx. (Dr. Gwenlyn Found)  . Diabetes mellitus    Type II  . Fibromyalgia   . History of blood transfusion    hemorrage duinrg pregancy  . Hx of echocardiogram  10/2011   EF 55-60%  . Hypercholesterolemia   . Hypertension   . Lymph edema   . Obesity hypoventilation syndrome ([place])   . On home O2   . Pneumonia    in past  . Renal disorder    Pt started dialysis in Dec.2016  . S/P colonoscopy 05/26/2007   Dr. Laural Golden sigmoid diverticulosis random biopsies benign  . S/P endoscopy 05/01/2009   Dr. Penelope Coop pill-induced esophageal ulcerations distal to midesophagus, 2 small ulcers in the antrum of the stomach  . Shortness of breath dyspnea    with any exertion or if heart rate  is irregular while on dialysis  . Sleep apnea     Medications:  Prescriptions Prior to Admission  Medication Sig Dispense Refill Last Dose  . acetaminophen (TYLENOL) 325 MG tablet Take 650 mg by mouth every 6 (six) hours as needed for moderate pain or fever.    01/17/2017 at Unknown time  . albuterol (PROVENTIL) (2.5 MG/3ML) 0.083% nebulizer solution Take 2.5 mg by nebulization every 6 (six) hours as needed for wheezing or shortness of breath.   Past Month  at Unknown time  . ALPRAZolam (XANAX) 0.5 MG tablet Take 0.5 mg by mouth 3 (three) times daily as needed for anxiety or sleep.    01/17/2017 at Unknown time  . amLODipine (NORVASC) 10 MG tablet Take 0.5 tablets (5 mg total) by mouth daily. 15 tablet 12 01/17/2017 at Unknown time  . cinacalcet (SENSIPAR) 30 MG tablet Take 30 mg by mouth daily.   01/17/2017 at Unknown time  . diphenhydrAMINE (BENADRYL) 25 mg capsule Take 25 mg by mouth every 6 (six) hours as needed for allergies.   unknown  . Insulin Glargine (LANTUS SOLOSTAR) 100 UNIT/ML Solostar Pen Inject 10 Units into the skin daily at 10 pm. 15 mL 11 01/17/2017 at Unknown time  . metoprolol (LOPRESSOR) 50 MG tablet Take 0.5 tablets (25 mg total) by mouth 2 (two) times daily. 30 tablet 12 01/17/2017 at Unknown time  . multivitamin (RENA-VIT) TABS tablet Take 1 tablet by mouth daily.   01/17/2017 at Unknown time  . Oxycodone HCl 10 MG TABS Take 1 tablet (10 mg total) by mouth 3  (three) times daily as needed (pain). (Patient taking differently: Take 10 mg by mouth 4 (four) times daily as needed (pain). ) 15 tablet 0 unknown  . sevelamer carbonate (RENVELA) 800 MG tablet Take 1,600-3,200 mg by mouth See admin instructions. Take 3200 mg by mouth 3 times daily with meals and take 1600 mg by mouth with snacks.   01/17/2017 at Unknown time  . doxycycline (VIBRAMYCIN) 50 MG capsule Take 2 capsules (100 mg total) by mouth 2 (two) times daily. (Patient not taking: Reported on 01/18/2017) 20 capsule 0 Not Taking at Unknown time  . predniSONE (STERAPRED UNI-PAK 21 TAB) 10 MG (21) TBPK tablet Take by package instructions (Patient not taking: Reported on 01/18/2017) 21 tablet 0 Not Taking at Unknown time    Assessment: 51 yo lady to start heparin for cor pulmonale, r/o PE. Her heparin level this am is <0.1 units/ml.  Noted increased LFTs.  Hg and PTLC stable Goal of Therapy:  Heparin level 0.3-0.7 units/ml Monitor platelets by anticoagulation protocol: Yes   Plan:  Heparin bolus 2000 units and drip at 1700 units/hr Check heparin level in ~ 8 hours Daily HL and CBC Monitor for bleeding complications  Thanks for allowing pharmacy to be a part of this patient's care.  Excell Seltzer, PharmD Clinical Pharmacist  01/22/2017,8:24 AM   Addum:  Heparin level remains subtherapeutic.  Will rebolus with 2000 unts and increase drip to 2000 units/hr.  Check HL in ~ 8 hours Excell Seltzer, PharmD

## 2017-01-22 NOTE — Progress Notes (Signed)
Called MD R.Fagan regarding LFT results, AST/2953 and ALT/2516. Disussed conversation with pharmacy regarding Heparin/Tamiflu/Levaquin. Md advised to leave everything as is and attending would assess pt in Am. No orders given at this time. MD advised to call if the patient had any further changes in status.

## 2017-01-22 NOTE — Progress Notes (Signed)
[place] regarding AST/AST change from 1/30 results AST/19-ALT/20 to results from 2/2 @ 2300 AST/2953 and ALT/2516. Asked Pharmacy to review [facility] about possible reasons for increase and also to Discuss IV Heparin risks. Pharmacy advised to contact MD regarding Heparin and that Tamiflu and Levaquin were potential risks for increase LFT's.

## 2017-01-22 NOTE — Progress Notes (Signed)
Subjective: She says she feels bad. She is still hypotensive. Cardiology note seen and appreciated. She has contrast allergy so she's going to need to be premedicated to have a CT and is probably safest to do it on Monday since we have limited resources over the weekend. I think we should still be able to tell if her cor pulmonale is related to pulmonary emboli versus obesity hypoventilation etc. She says she is sore all over. Transaminases are very high. Her leg feels better.  Objective: Vital signs in last 24 hours: Temp:  [97.7 F (36.5 C)-98.2 F (36.8 C)] 97.8 F (36.6 C) (02/03 0742) Pulse Rate:  [72-104] 78 (02/03 0800) Resp:  [13-33] 15 (02/03 0800) BP: (84-134)/(54-99) 100/67 (02/03 0800) SpO2:  [94 %-100 %] 97 % (02/03 0800) Weight change:  Last BM Date: 01/17/17  Intake/Output from previous day: 02/02 0701 - 02/03 0700 In: 1199.3 [P.O.:480; I.V.:584.3; IV Piggyback:50] Out: 2200   PHYSICAL EXAM General appearance: alert, cooperative and morbidly obese Resp: clear to auscultation bilaterally Cardio: regular rate and rhythm, S1, S2 normal, no murmur, click, rub or gallop GI: soft, non-tender; bowel sounds normal; no masses,  no organomegaly Extremities: Chronic lymphedema cellulitis is better Mucous membranes are moist.  Lab Results:  Results for orders placed or performed during the hospital encounter of 01/18/17 (from the past 48 hour(s))  Glucose, capillary     Status: Abnormal   Collection Time: 01/20/17 11:47 AM  Result Value Ref Range   Glucose-Capillary 123 (H) 65 - 99 mg/dL   Comment 1 Notify RN    Comment 2 Document in Chart   Troponin I (q 6hr x 3)     Status: Abnormal   Collection Time: 01/20/17  1:33 PM  Result Value Ref Range   Troponin I 0.97 (HH) <0.03 ng/mL    Comment: CRITICAL VALUE NOTED.  VALUE IS CONSISTENT WITH PREVIOUSLY REPORTED AND CALLED VALUE.  Lactic acid, plasma     Status: Abnormal   Collection Time: 01/20/17  3:41 PM  Result Value  Ref Range   Lactic Acid, Venous 8.2 (HH) 0.5 - 1.9 mmol/L    Comment: CRITICAL RESULT CALLED TO, READ BACK BY AND VERIFIED WITH: HOWARD,C ON 01/20/17 AT 1640 BY LOY,C   Glucose, capillary     Status: Abnormal   Collection Time: 01/20/17  4:24 PM  Result Value Ref Range   Glucose-Capillary 126 (H) 65 - 99 mg/dL   Comment 1 Notify RN    Comment 2 Document in Chart   Troponin I (q 6hr x 3)     Status: Abnormal   Collection Time: 01/20/17  7:44 PM  Result Value Ref Range   Troponin I 3.57 (HH) <0.03 ng/mL    Comment: CRITICAL VALUE NOTED.  VALUE IS CONSISTENT WITH PREVIOUSLY REPORTED AND CALLED VALUE.  Lactic acid, plasma     Status: Abnormal   Collection Time: 01/20/17  7:44 PM  Result Value Ref Range   Lactic Acid, Venous 6.9 (HH) 0.5 - 1.9 mmol/L    Comment: CRITICAL RESULT CALLED TO, READ BACK BY AND VERIFIED WITH: WAGNOR,R AT 2030 ON 01/20/2017 BY ISLEY,B   Glucose, capillary     Status: Abnormal   Collection Time: 01/20/17  9:09 PM  Result Value Ref Range   Glucose-Capillary 146 (H) 65 - 99 mg/dL  CBC     Status: Abnormal   Collection Time: 01/21/17  4:41 AM  Result Value Ref Range   WBC 17.2 (H) 4.0 - 10.5 K/uL  RBC 3.15 (L) 3.87 - 5.11 MIL/uL   Hemoglobin 10.2 (L) 12.0 - 15.0 g/dL   HCT 30.4 (L) 36.0 - 46.0 %   MCV 96.5 78.0 - 100.0 fL   MCH 32.4 26.0 - 34.0 pg   MCHC 33.6 30.0 - 36.0 g/dL   RDW 15.2 11.5 - 15.5 %   Platelets 94 (L) 150 - 400 K/uL    Comment: SPECIMEN CHECKED FOR CLOTS CONSISTENT WITH PREVIOUS RESULT   Renal function panel     Status: Abnormal   Collection Time: 01/21/17  4:41 AM  Result Value Ref Range   Sodium 132 (L) 135 - 145 mmol/L   Potassium 4.8 3.5 - 5.1 mmol/L   Chloride 98 (L) 101 - 111 mmol/L   CO2 20 (L) 22 - 32 mmol/L   Glucose, Bld 153 (H) 65 - 99 mg/dL   BUN 51 (H) 6 - 20 mg/dL   Creatinine, Ser 5.47 (H) 0.44 - 1.00 mg/dL   Calcium 8.0 (L) 8.9 - 10.3 mg/dL   Phosphorus 7.7 (H) 2.5 - 4.6 mg/dL   Albumin 2.9 (L) 3.5 - 5.0 g/dL    GFR calc non Af Amer 8 (L) >60 mL/min   GFR calc Af Amer 10 (L) >60 mL/min    Comment: (NOTE) The eGFR has been calculated using the CKD EPI equation. This calculation has not been validated in all clinical situations. eGFR's persistently <60 mL/min signify possible Chronic Kidney Disease.    Anion gap 14 5 - 15  Glucose, capillary     Status: Abnormal   Collection Time: 01/21/17  7:25 AM  Result Value Ref Range   Glucose-Capillary 135 (H) 65 - 99 mg/dL  CBC with Differential/Platelet     Status: Abnormal   Collection Time: 01/21/17  8:00 AM  Result Value Ref Range   WBC 16.2 (H) 4.0 - 10.5 K/uL   RBC 3.18 (L) 3.87 - 5.11 MIL/uL   Hemoglobin 10.3 (L) 12.0 - 15.0 g/dL   HCT 30.5 (L) 36.0 - 46.0 %   MCV 95.9 78.0 - 100.0 fL   MCH 32.4 26.0 - 34.0 pg   MCHC 33.8 30.0 - 36.0 g/dL   RDW 15.1 11.5 - 15.5 %   Platelets 87 (L) 150 - 400 K/uL    Comment: SPECIMEN CHECKED FOR CLOTS PLATELET COUNT CONFIRMED BY SMEAR    Neutrophils Relative % 85 %   Neutro Abs 13.8 (H) 1.7 - 7.7 K/uL   Lymphocytes Relative 8 %   Lymphs Abs 1.2 0.7 - 4.0 K/uL   Monocytes Relative 7 %   Monocytes Absolute 1.1 (H) 0.1 - 1.0 K/uL   Eosinophils Relative 0 %   Eosinophils Absolute 0.0 0.0 - 0.7 K/uL   Basophils Relative 0 %   Basophils Absolute 0.0 0.0 - 0.1 K/uL  Lactic acid, plasma     Status: Abnormal   Collection Time: 01/21/17  8:00 AM  Result Value Ref Range   Lactic Acid, Venous 2.8 (HH) 0.5 - 1.9 mmol/L    Comment: CRITICAL RESULT CALLED TO, READ BACK BY AND VERIFIED WITH: HOWARD,C AT 08:45AM ON 01/21/17 BY FESTERMAN,C   Troponin I     Status: Abnormal   Collection Time: 01/21/17  8:00 AM  Result Value Ref Range   Troponin I 5.31 (HH) <0.03 ng/mL    Comment: CRITICAL RESULT CALLED TO, READ BACK BY AND VERIFIED WITH: GAMMONS,S AT 8:45AM ON 01/21/17 BY FESTERMAN,C   Glucose, capillary     Status: Abnormal  Collection Time: 01/21/17 11:21 AM  Result Value Ref Range   Glucose-Capillary 139  (H) 65 - 99 mg/dL  Glucose, capillary     Status: Abnormal   Collection Time: 01/21/17  5:07 PM  Result Value Ref Range   Glucose-Capillary 165 (H) 65 - 99 mg/dL  Comprehensive metabolic panel     Status: Abnormal   Collection Time: 01/21/17 10:50 PM  Result Value Ref Range   Sodium 137 135 - 145 mmol/L   Potassium 4.2 3.5 - 5.1 mmol/L   Chloride 99 (L) 101 - 111 mmol/L   CO2 26 22 - 32 mmol/L   Glucose, Bld 125 (H) 65 - 99 mg/dL   BUN 33 (H) 6 - 20 mg/dL   Creatinine, Ser 3.85 (H) 0.44 - 1.00 mg/dL   Calcium 8.4 (L) 8.9 - 10.3 mg/dL   Total Protein 6.5 6.5 - 8.1 g/dL   Albumin 3.1 (L) 3.5 - 5.0 g/dL   AST 2,953 (H) 15 - 41 U/L    Comment: RESULTS CONFIRMED BY MANUAL DILUTION   ALT 2,516 (H) 14 - 54 U/L   Alkaline Phosphatase 175 (H) 38 - 126 U/L   Total Bilirubin 3.0 (H) 0.3 - 1.2 mg/dL   GFR calc non Af Amer 13 (L) >60 mL/min   GFR calc Af Amer 15 (L) >60 mL/min    Comment: (NOTE) The eGFR has been calculated using the CKD EPI equation. This calculation has not been validated in all clinical situations. eGFR's persistently <60 mL/min signify possible Chronic Kidney Disease.    Anion gap 12 5 - 15  Glucose, capillary     Status: Abnormal   Collection Time: 01/21/17 10:50 PM  Result Value Ref Range   Glucose-Capillary 104 (H) 65 - 99 mg/dL   Comment 1 Notify RN    Comment 2 Document in Chart   .Cooxemetry Panel (carboxy, met, total hgb, O2 sat)     Status: Abnormal   Collection Time: 01/21/17 11:30 PM  Result Value Ref Range   Total hemoglobin 10.8 (L) 12.0 - 16.0 g/dL   O2 Saturation 96.9 %   Carboxyhemoglobin 3.2 (H) 0.5 - 1.5 %    Comment: CRITICAL RESULT CALLED TO, READ BACK BY AND VERIFIED WITH:  NIKKI,RN BY K KNICK RRT,RCP ON 01/21/2017 AT 2343    Methemoglobin 0.9 0.0 - 1.5 %   Total oxygen content 14.3 (L) 15.0 - 23.0 mL/dL  CBC with Differential/Platelet     Status: Abnormal   Collection Time: 01/22/17  6:00 AM  Result Value Ref Range   WBC 13.7 (H) 4.0 -  10.5 K/uL   RBC 3.08 (L) 3.87 - 5.11 MIL/uL   Hemoglobin 10.0 (L) 12.0 - 15.0 g/dL   HCT 29.4 (L) 36.0 - 46.0 %   MCV 95.5 78.0 - 100.0 fL   MCH 32.5 26.0 - 34.0 pg   MCHC 34.0 30.0 - 36.0 g/dL   RDW 15.1 11.5 - 15.5 %   Platelets 92 (L) 150 - 400 K/uL    Comment: SPECIMEN CHECKED FOR CLOTS PLATELET COUNT CONFIRMED BY SMEAR    Neutrophils Relative % 85 %   Neutro Abs 11.6 (H) 1.7 - 7.7 K/uL   Lymphocytes Relative 7 %   Lymphs Abs 0.9 0.7 - 4.0 K/uL   Monocytes Relative 8 %   Monocytes Absolute 1.1 (H) 0.1 - 1.0 K/uL   Eosinophils Relative 0 %   Eosinophils Absolute 0.0 0.0 - 0.7 K/uL   Basophils Relative 0 %  Basophils Absolute 0.0 0.0 - 0.1 K/uL  Comprehensive metabolic panel     Status: Abnormal   Collection Time: 01/22/17  6:00 AM  Result Value Ref Range   Sodium 133 (L) 135 - 145 mmol/L   Potassium 4.7 3.5 - 5.1 mmol/L   Chloride 98 (L) 101 - 111 mmol/L   CO2 25 22 - 32 mmol/L   Glucose, Bld 152 (H) 65 - 99 mg/dL   BUN 42 (H) 6 - 20 mg/dL   Creatinine, Ser 4.62 (H) 0.44 - 1.00 mg/dL   Calcium 8.2 (L) 8.9 - 10.3 mg/dL   Total Protein 6.4 (L) 6.5 - 8.1 g/dL   Albumin 2.9 (L) 3.5 - 5.0 g/dL   AST 2,283 (H) 15 - 41 U/L    Comment: RESULTS CONFIRMED BY MANUAL DILUTION RESULT CALLED TO, READ BACK BY AND VERIFIED WITH: PHILLIPS,C AT 0806 ON 01/22/2017 BY MOSLEY,J CORRECTED ON 02/03 AT 0805: PREVIOUSLY REPORTED AS 761    ALT 2,202 (H) 14 - 54 U/L    Comment: RESULTS CONFIRMED BY MANUAL DILUTION RESULT CALLED TO, READ BACK BY AND VERIFIED WITH: PHILLIPS,C AT 0806 ON 01/22/2017 BY MOSLEY,J CORRECTED ON 02/03 AT 0805: PREVIOUSLY REPORTED AS 734    Alkaline Phosphatase 183 (H) 38 - 126 U/L   Total Bilirubin 3.0 (H) 0.3 - 1.2 mg/dL   GFR calc non Af Amer 10 (L) >60 mL/min   GFR calc Af Amer 12 (L) >60 mL/min    Comment: (NOTE) The eGFR has been calculated using the CKD EPI equation. This calculation has not been validated in all clinical situations. eGFR's persistently <60  mL/min signify possible Chronic Kidney Disease.    Anion gap 10 5 - 15    Comment: Performed at [facility], [address] [place], [place] [address]  Heparin level (unfractionated)     Status: Abnormal   Collection Time: 01/22/17  6:00 AM  Result Value Ref Range   Heparin Unfractionated <0.10 (L) 0.30 - 0.70 IU/mL    Comment:        IF HEPARIN RESULTS ARE BELOW EXPECTED VALUES, AND PATIENT DOSAGE HAS BEEN CONFIRMED, SUGGEST FOLLOW UP TESTING OF ANTITHROMBIN III LEVELS.   Glucose, capillary     Status: Abnormal   Collection Time: 01/22/17  7:41 AM  Result Value Ref Range   Glucose-Capillary 153 (H) 65 - 99 mg/dL    ABGS No results for input(s): PHART, PO2ART, TCO2, HCO3 in the last 72 hours.  Invalid input(s): PCO2 CULTURES Recent Results (from the past 240 hour(s))  Culture, blood (routine x 2)     Status: Abnormal   Collection Time: 01/18/17  3:41 PM  Result Value Ref Range Status   Specimen Description LEFT ANTECUBITAL  Final   Special Requests BOTTLES DRAWN AEROBIC AND ANAEROBIC 6CC EACH  Final   Culture  Setup Time   Final    GRAM NEGATIVE RODS AEROBIC AND ANAEROBIC BOTTLES Gram Stain Report Called to,Read Back By and Verified With: HOWARD,C AT 0725 BY HUFFINES,S ON 01/19/17    Culture (A)  Final    SERRATIA MARCESCENS SUSCEPTIBILITIES PERFORMED ON PREVIOUS CULTURE WITHIN THE LAST 5 DAYS. Performed at [facility], [address] [place], [place] [address]    Report Status 01/21/2017 FINAL  Final  Culture, blood (routine x 2)     Status: Abnormal   Collection Time: 01/18/17  3:41 PM  Result Value Ref Range Status   Specimen Description BLOOD LEFT HAND  Final     Special Requests BOTTLES DRAWN AEROBIC AND ANAEROBIC 6CC EACH  Final   Culture  Setup Time   Final    GRAM NEGATIVE RODS AEROBIC AND ANAEROBIC BOTTLES Gram Stain Report Called to,Read Back By and Verified With: HOWARD,C AT 0725 BY HUFFINES,S ON 01/19/17. CRITICAL RESULT CALLED  TO, READ BACK BY AND VERIFIED WITH: S HALL PHARMD 1535 01/19/17 A BROWNING Performed at [facility], [address] [place], [place] [address]    Culture SERRATIA MARCESCENS (A)  Final   Report Status 01/21/2017 FINAL  Final   Organism ID, Bacteria SERRATIA MARCESCENS  Final      Susceptibility   Serratia marcescens - MIC*    CEFAZOLIN >=64 RESISTANT Resistant     CEFEPIME <=1 SENSITIVE Sensitive     CEFTAZIDIME <=1 SENSITIVE Sensitive     CEFTRIAXONE <=1 SENSITIVE Sensitive     CIPROFLOXACIN <=0.25 SENSITIVE Sensitive     GENTAMICIN <=1 SENSITIVE Sensitive     TRIMETH/SULFA <=20 SENSITIVE Sensitive     * SERRATIA MARCESCENS  Blood Culture ID Panel (Reflexed)     Status: Abnormal   Collection Time: 01/18/17  3:41 PM  Result Value Ref Range Status   Enterococcus species NOT DETECTED NOT DETECTED Final   Listeria monocytogenes NOT DETECTED NOT DETECTED Final   Staphylococcus species NOT DETECTED NOT DETECTED Final   Staphylococcus aureus NOT DETECTED NOT DETECTED Final   Streptococcus species NOT DETECTED NOT DETECTED Final   Streptococcus agalactiae NOT DETECTED NOT DETECTED Final   Streptococcus pneumoniae NOT DETECTED NOT DETECTED Final   Streptococcus pyogenes NOT DETECTED NOT DETECTED Final   Acinetobacter baumannii NOT DETECTED NOT DETECTED Final   Enterobacteriaceae species DETECTED (A) NOT DETECTED Final    Comment: Enterobacteriaceae represent a large family of gram-negative bacteria, not a single organism. CRITICAL RESULT CALLED TO, READ BACK BY AND VERIFIED WITH: S HALL PHARMD 1535 01/19/17 A BROWNING    Enterobacter cloacae complex NOT DETECTED NOT DETECTED Final   Escherichia coli NOT DETECTED NOT DETECTED Final   Klebsiella oxytoca NOT DETECTED NOT DETECTED Final   Klebsiella pneumoniae NOT DETECTED NOT DETECTED Final   Proteus species NOT DETECTED NOT DETECTED Final   Serratia marcescens DETECTED (A) NOT DETECTED Final    Comment: CRITICAL RESULT CALLED TO,  READ BACK BY AND VERIFIED WITH: S HALL PHARMD 1535 01/19/17 A BROWNING    Carbapenem resistance NOT DETECTED NOT DETECTED Final   Haemophilus influenzae NOT DETECTED NOT DETECTED Final   Neisseria meningitidis NOT DETECTED NOT DETECTED Final   Pseudomonas aeruginosa NOT DETECTED NOT DETECTED Final   Candida albicans NOT DETECTED NOT DETECTED Final   Candida glabrata NOT DETECTED NOT DETECTED Final   Candida krusei NOT DETECTED NOT DETECTED Final   Candida parapsilosis NOT DETECTED NOT DETECTED Final   Candida tropicalis NOT DETECTED NOT DETECTED Final    Comment: Performed at [facility], [address] [place], [place] [address]  MRSA PCR Screening     Status: None   Collection Time: 01/18/17 10:25 PM  Result Value Ref Range Status   MRSA by PCR NEGATIVE NEGATIVE Final    Comment:        The GeneXpert MRSA Assay (FDA approved for NASAL specimens only), is one component of a comprehensive MRSA colonization surveillance program. It is not intended to diagnose MRSA infection nor to guide or monitor treatment for MRSA infections.    Studies/Results: No results found.  Medications:  Prior to Admission:  Prescriptions Prior to   Admission  Medication Sig Dispense Refill Last Dose  . acetaminophen (TYLENOL) 325 MG tablet Take 650 mg by mouth every 6 (six) hours as needed for moderate pain or fever.    01/17/2017 at Unknown time  . albuterol (PROVENTIL) (2.5 MG/3ML) 0.083% nebulizer solution Take 2.5 mg by nebulization every 6 (six) hours as needed for wheezing or shortness of breath.   Past Month at Unknown time  . ALPRAZolam (XANAX) 0.5 MG tablet Take 0.5 mg by mouth 3 (three) times daily as needed for anxiety or sleep.    01/17/2017 at Unknown time  . amLODipine (NORVASC) 10 MG tablet Take 0.5 tablets (5 mg total) by mouth daily. 15 tablet 12 01/17/2017 at Unknown time  . cinacalcet (SENSIPAR) 30 MG tablet Take 30 mg by mouth daily.   01/17/2017 at Unknown time  .  diphenhydrAMINE (BENADRYL) 25 mg capsule Take 25 mg by mouth every 6 (six) hours as needed for allergies.   unknown  . Insulin Glargine (LANTUS SOLOSTAR) 100 UNIT/ML Solostar Pen Inject 10 Units into the skin daily at 10 pm. 15 mL 11 01/17/2017 at Unknown time  . metoprolol (LOPRESSOR) 50 MG tablet Take 0.5 tablets (25 mg total) by mouth 2 (two) times daily. 30 tablet 12 01/17/2017 at Unknown time  . multivitamin (RENA-VIT) TABS tablet Take 1 tablet by mouth daily.   01/17/2017 at Unknown time  . Oxycodone HCl 10 MG TABS Take 1 tablet (10 mg total) by mouth 3 (three) times daily as needed (pain). (Patient taking differently: Take 10 mg by mouth 4 (four) times daily as needed (pain). ) 15 tablet 0 unknown  . sevelamer carbonate (RENVELA) 800 MG tablet Take 1,600-3,200 mg by mouth See admin instructions. Take 3200 mg by mouth 3 times daily with meals and take 1600 mg by mouth with snacks.   01/17/2017 at Unknown time  . doxycycline (VIBRAMYCIN) 50 MG capsule Take 2 capsules (100 mg total) by mouth 2 (two) times daily. (Patient not taking: Reported on 01/18/2017) 20 capsule 0 Not Taking at Unknown time  . predniSONE (STERAPRED UNI-PAK 21 TAB) 10 MG (21) TBPK tablet Take by package instructions (Patient not taking: Reported on 01/18/2017) 21 tablet 0 Not Taking at Unknown time   Scheduled: . ceFEPime (MAXIPIME) IV  2 g Intravenous Q M,W,F-HD  . Chlorhexidine Gluconate Cloth  6 each Topical Daily  . epoetin (EPOGEN/PROCRIT) injection  8,000 Units Intravenous Q M,W,F-HD  . insulin aspart  0-9 Units Subcutaneous TID WC  . levofloxacin (LEVAQUIN) IV  750 mg Intravenous Once   Followed by  . levofloxacin (LEVAQUIN) IV  500 mg Intravenous Q48H  . linaclotide  145 mcg Oral QAC breakfast  . mouth rinse  15 mL Mouth Rinse BID  . oseltamivir  30 mg Oral QHS  . sodium chloride flush  10-40 mL Intracatheter Q12H   Continuous: . heparin 1,700 Units/hr (01/22/17 0855)  . phenylephrine ([name]-SYNEPHRINE) Adult infusion  65 mcg/min (01/22/17 0800)   VOH:YWVPXT chloride, sodium chloride, sodium chloride, sodium chloride, acetaminophen, albuterol, ALPRAZolam, alteplase, guaiFENesin-dextromethorphan, heparin, levalbuterol, lidocaine (PF), lidocaine (PF), lidocaine-prilocaine, ondansetron **OR** ondansetron (ZOFRAN) IV, oxyCODONE, pentafluoroprop-tetrafluoroeth, sodium chloride flush  Assesment: She was admitted with sepsis from cellulitis. She is still requiring pressors. She has cor pulmonale on echocardiogram. We are looking for the reason for that. CT angiogram has been suggested and I think we'll proceed with that but will discuss with cardiology on Monday morning 2 5/18 whether it would make more sense just to anticoagulate her considering  her contrast allergy and the high likelihood that she has had blood clots and will continue to have them considering her sedentary lifestyle and her morbid obesity. Principal Problem:   Fever Active Problems:   Diabetes mellitus (HCC)   Morbid obesity (HCC)   Obesity hypoventilation syndrome (HCC)   Chronic diastolic CHF (congestive heart failure) (HCC)   Chronic respiratory failure with hypoxia (HCC)   ESRD on dialysis (HCC)   Flu-like symptoms   Elevated troponin    Plan: Continue IV antibiotics. Continue other treatments. CT as noted. Try to make it midmorning so that I'll have time to discuss with cardiology and can cancel if appropriate or we can go ahead and do it. She will be pretreated for the CT.    LOS: 4 days   HAWKINS,EDWARD L 01/22/2017, 9:33 AM  

## 2017-01-23 LAB — COMPREHENSIVE METABOLIC PANEL
ALBUMIN: 2.6 g/dL — AB (ref 3.5–5.0)
ALK PHOS: 217 U/L — AB (ref 38–126)
ALT: 2049 U/L — AB (ref 14–54)
ANION GAP: 13 (ref 5–15)
AST: 1603 U/L — ABNORMAL HIGH (ref 15–41)
BUN: 55 mg/dL — ABNORMAL HIGH (ref 6–20)
CHLORIDE: 95 mmol/L — AB (ref 101–111)
CO2: 23 mmol/L (ref 22–32)
Calcium: 8.7 mg/dL — ABNORMAL LOW (ref 8.9–10.3)
Creatinine, Ser: 5.42 mg/dL — ABNORMAL HIGH (ref 0.44–1.00)
GFR calc non Af Amer: 8 mL/min — ABNORMAL LOW (ref >60)
GFR, EST AFRICAN AMERICAN: 10 mL/min — AB (ref >60)
Glucose, Bld: 156 mg/dL — ABNORMAL HIGH (ref 65–99)
Potassium: 4.8 mmol/L (ref 3.5–5.1)
SODIUM: 131 mmol/L — AB (ref 135–145)
Total Bilirubin: 2.9 mg/dL — ABNORMAL HIGH (ref 0.3–1.2)
Total Protein: 6 g/dL — ABNORMAL LOW (ref 6.5–8.1)

## 2017-01-23 LAB — CBC
HCT: 29.3 % — ABNORMAL LOW (ref 36.0–46.0)
HEMOGLOBIN: 10 g/dL — AB (ref 12.0–15.0)
MCH: 32.4 pg (ref 26.0–34.0)
MCHC: 34.1 g/dL (ref 30.0–36.0)
MCV: 94.8 fL (ref 78.0–100.0)
Platelets: 116 10*3/uL — ABNORMAL LOW (ref 150–400)
RBC: 3.09 MIL/uL — AB (ref 3.87–5.11)
RDW: 14.9 % (ref 11.5–15.5)
WBC: 14.6 10*3/uL — ABNORMAL HIGH (ref 4.0–10.5)

## 2017-01-23 LAB — GLUCOSE, CAPILLARY
GLUCOSE-CAPILLARY: 144 mg/dL — AB (ref 65–99)
GLUCOSE-CAPILLARY: 189 mg/dL — AB (ref 65–99)
Glucose-Capillary: 153 mg/dL — ABNORMAL HIGH (ref 65–99)
Glucose-Capillary: 173 mg/dL — ABNORMAL HIGH (ref 65–99)

## 2017-01-23 LAB — HEPARIN LEVEL (UNFRACTIONATED)
HEPARIN UNFRACTIONATED: 1.1 [IU]/mL — AB (ref 0.30–0.70)
Heparin Unfractionated: <0.1 IU/mL — ABNORMAL LOW (ref 0.30–0.70)

## 2017-01-23 MED ORDER — HEPARIN BOLUS VIA INFUSION
2000.0000 [IU] | Freq: Once | INTRAVENOUS | Status: AC
  Administered 2017-01-23: 2000 [IU] via INTRAVENOUS
  Filled 2017-01-23: qty 2000

## 2017-01-23 NOTE — Progress Notes (Signed)
Subjective: Interval History: Patient says that she is not feeling good. She has some nausea and vomiting this morning. She denies any difficulty breathing.  Objective: Vital signs in last 24 hours: Temp:  [97.7 F (36.5 C)-98.7 F (37.1 C)] 97.7 F (36.5 C) (02/04 0814) Pulse Rate:  [52-94] 78 (02/04 0913) Resp:  [12-28] 18 (02/04 0913) BP: (72-121)/(41-97) 113/71 (02/04 0913) SpO2:  [94 %-99 %] 97 % (02/04 0913) Weight:  [150.1 kg (330 lb 14.6 oz)] 150.1 kg (330 lb 14.6 oz) (02/04 0500) Weight change:   Intake/Output from previous day: 02/03 0701 - 02/04 0700 In: 2089.6 [P.O.:580; I.V.:1309.6; IV Piggyback:200] Out: 0  Intake/Output this shift: Total I/O In: 195.4 [I.V.:195.4] Out: -   General appearance: alert, cooperative and no distress Resp: diminished breath sounds anterior - bilateral and wheezes bilaterally Cardio: regular rate and rhythm Extremities: edema Patient with venous stasis and chronic leg edema  Lab Results:  Recent Labs  01/22/17 0600 01/23/17 0241  WBC 13.7* 14.6*  HGB 10.0* 10.0*  HCT 29.4* 29.3*  PLT 92* 116*   BMET:   Recent Labs  01/22/17 0600 01/23/17 0241  NA 133* 131*  K 4.7 4.8  CL 98* 95*  CO2 25 23  GLUCOSE 152* 156*  BUN 42* 55*  CREATININE 4.62* 5.42*  CALCIUM 8.2* 8.7*   No results for input(s): PTH in the last 72 hours. Iron Studies: No results for input(s): IRON, TIBC, TRANSFERRIN, FERRITIN in the last 72 hours.  Studies/Results: No results found.  I have reviewed the patient's current medications.  Assessment/Plan: Problem #1 sepsis: Presently patient with fever, elevated white blood cell count. Blood culture is growing Serratia and Enterobacter. Patient is on antibiotics. Patient is afebrile But her white blood cell count remained elevated. Problem #2 end-stage renal disease: She is status post hemodialysis  on Friday. She has some nausea and vomiting this morning. Otherwise she feels okay. Her potassium is  normal Problem #3 anemia: Her hemoglobin is with in our target goal. Patient is on Epogen Problem #4 fluid management: Patient denies any difficulty breathing. Problem #5 metabolic bone disease: Calcium is in range but her phosphorus is high. She started on binder. Problem # 6 sleep apnea: Not on CPAP Problem #7 morbid obesity Problem #8 diabetes: Her blood sugar is reasonably controlled Problem #9 elevated LFTs: Improving. Possibly ischemic Plan: We'll make arrangements for patient to get dialysis tomorrow. Patient doesn't require dialysis today We'll check her CBC and renal panel in the morning.    LOS: 5 days   Kutler Vanvranken S 01/23/2017,10:09 AM

## 2017-01-23 NOTE — Progress Notes (Signed)
[facility] for heparin Indication: cor pulmonale, r/o PE  Allergies  Allergen Reactions  . Contrast Media [Iodinated Diagnostic Agents] Anaphylaxis, Hives, Swelling and Other (See Comments)    Dye for cardiac cath. Tongue swells  . Pneumococcal Vaccines Swelling    Turns skin black, and bodily swelling  . Shellfish Allergy Itching, Swelling and Other (See Comments)    Facial swelling - Pt able to eat now 11/25/16  . Vancomycin Nausea And Vomiting and Other (See Comments)    Infusion "made me feel like I was dying" had to be readmitted to hospital    Patient Measurements: Height: _0  (165.1 cm) Weight: (!) 330 lb 14.6 oz (150.1 kg) IBW/kg (Calculated) : 57 Heparin Dosing Weight: 93 kg  Vital Signs: Temp: 97.7 F (36.5 C) (02/04 0814) Temp Source: Oral (02/04 0814) BP: 121/85 (02/04 0800) Pulse Rate: 81 (02/04 0814)  Labs:  Recent Labs  01/20/17 1333 01/20/17 1944  01/21/17 0800 01/21/17 2250 01/22/17 0600 01/22/17 1612 01/23/17 0241  HGB  --   --   < > 10.3*  --  10.0*  --  10.0*  HCT  --   --   < > 30.5*  --  29.4*  --  29.3*  PLT  --   --   < > 87*  --  92*  --  116*  HEPARINUNFRC  --   --   --   --   --  <0.10* <0.10* <0.10*  CREATININE  --   --   < >  --  3.85* 4.62*  --  5.42*  TROPONINI 0.97* 3.57*  --  5.31*  --   --   --   --   < > = values in this interval not displayed.  Estimated Creatinine Clearance: 18.5 mL/min (by C-G formula based on SCr of 5.42 mg/dL (H)).   Medical History: Past Medical History:  Diagnosis Date  . Anxiety   . Asthma    as a child  . Cancer ([place])    thyroid  . Cellulitis   . CHF (congestive heart failure) ([place])   . Chiari malformation    s/p surgery  . Chiari malformation   . Chronic pain   . Complication of anesthesia 11/28/15   Resp arrest after  conscious  sedation  . Constipation   . Coronary artery disease    40-50% mid LAD 04/29/09, Medical tx. (Dr. Gwenlyn Found)  . Diabetes  mellitus    Type II  . Fibromyalgia   . History of blood transfusion    hemorrage duinrg pregancy  . Hx of echocardiogram 10/2011   EF 55-60%  . Hypercholesterolemia   . Hypertension   . Lymph edema   . Obesity hypoventilation syndrome ([place])   . On home O2   . Pneumonia    in past  . Renal disorder    Pt started dialysis in Dec.2016  . S/P colonoscopy 05/26/2007   Dr. Laural Golden sigmoid diverticulosis random biopsies benign  . S/P endoscopy 05/01/2009   Dr. Penelope Coop pill-induced esophageal ulcerations distal to midesophagus, 2 small ulcers in the antrum of the stomach  . Shortness of breath dyspnea    with any exertion or if heart rate  is irregular while on dialysis  . Sleep apnea     Medications:  Prescriptions Prior to Admission  Medication Sig Dispense Refill Last Dose  . acetaminophen (TYLENOL) 325 MG tablet Take 650 mg by mouth every 6 (six) hours as  needed for moderate pain or fever.    01/17/2017 at Unknown time  . albuterol (PROVENTIL) (2.5 MG/3ML) 0.083% nebulizer solution Take 2.5 mg by nebulization every 6 (six) hours as needed for wheezing or shortness of breath.   Past Month at Unknown time  . ALPRAZolam (XANAX) 0.5 MG tablet Take 0.5 mg by mouth 3 (three) times daily as needed for anxiety or sleep.    01/17/2017 at Unknown time  . amLODipine (NORVASC) 10 MG tablet Take 0.5 tablets (5 mg total) by mouth daily. 15 tablet 12 01/17/2017 at Unknown time  . cinacalcet (SENSIPAR) 30 MG tablet Take 30 mg by mouth daily.   01/17/2017 at Unknown time  . diphenhydrAMINE (BENADRYL) 25 mg capsule Take 25 mg by mouth every 6 (six) hours as needed for allergies.   unknown  . Insulin Glargine (LANTUS SOLOSTAR) 100 UNIT/ML Solostar Pen Inject 10 Units into the skin daily at 10 pm. 15 mL 11 01/17/2017 at Unknown time  . metoprolol (LOPRESSOR) 50 MG tablet Take 0.5 tablets (25 mg total) by mouth 2 (two) times daily. 30 tablet 12 01/17/2017 at Unknown time  . multivitamin (RENA-VIT) TABS tablet  Take 1 tablet by mouth daily.   01/17/2017 at Unknown time  . Oxycodone HCl 10 MG TABS Take 1 tablet (10 mg total) by mouth 3 (three) times daily as needed (pain). (Patient taking differently: Take 10 mg by mouth 4 (four) times daily as needed (pain). ) 15 tablet 0 unknown  . sevelamer carbonate (RENVELA) 800 MG tablet Take 1,600-3,200 mg by mouth See admin instructions. Take 3200 mg by mouth 3 times daily with meals and take 1600 mg by mouth with snacks.   01/17/2017 at Unknown time  . doxycycline (VIBRAMYCIN) 50 MG capsule Take 2 capsules (100 mg total) by mouth 2 (two) times daily. (Patient not taking: Reported on 01/18/2017) 20 capsule 0 Not Taking at Unknown time  . predniSONE (STERAPRED UNI-PAK 21 TAB) 10 MG (21) TBPK tablet Take by package instructions (Patient not taking: Reported on 01/18/2017) 21 tablet 0 Not Taking at Unknown time    Assessment: 51 yo lady to start heparin for cor pulmonale, r/o PE. Her heparin level this am remains subtherpaeutic.  LFTs improving.  Hg and PTLC stable Goal of Therapy:  Heparin level 0.3-0.7 units/ml Monitor platelets by anticoagulation protocol: Yes   Plan:  Heparin bolus 2000 units and drip at 2400 units/hr Check heparin level in ~ 8 hours Daily HL and CBC Monitor for bleeding complications  Thanks for allowing pharmacy to be a part of this patient's care.  Excell Seltzer, PharmD Clinical Pharmacist  01/23/2017,8:43 AM

## 2017-01-23 NOTE — Plan of Care (Signed)
Problem: Safety: Goal: Ability to remain free from injury will improve Outcome: Not Progressing PT IS GROGGY WITH DELAYED RESPONSES.  Problem: Activity: Goal: Risk for activity intolerance will decrease Outcome: Not Progressing PT IS UNABLE TO PARTICIPATE W/ PERSONAL CARE D/T WEAKNESS AND DELAYED RESPONSE TIME.

## 2017-01-23 NOTE — Progress Notes (Signed)
Pharmacy Antibiotic Note  [name] is a 51 y.o. female admitted on 01/18/2017 with sepsis.  Pharmacy has been consulted for cefepime dosing. Blood cx + for Serratia. She has cellulitis. Consider dc levaquin  Plan: Cont cefepime 2gm after each HD session Continue levaquin 538m IV every 48 hours. Monitor labs, micro and vitals.   Height: 5' 5" (165.1 cm) Weight: (!) 330 lb 14.6 oz (150.1 kg) IBW/kg (Calculated) : 57  Temp (24hrs), Avg:98.2 F (36.8 C), Min:97.7 F (36.5 C), Max:98.7 F (37.1 C)   Recent Labs Lab 01/19/17 0212  01/19/17 0940 01/20/17 0810 01/20/17 0813 01/20/17 1541 01/20/17 1944 01/21/17 0441 01/21/17 0800 01/21/17 2250 01/22/17 0600 01/23/17 0241  WBC 9.5  --  11.9*  --  12.7*  --   --  17.2* 16.2*  --  13.7* 14.6*  CREATININE 4.84*  --   --   --   --   --   --  5.47*  --  3.85* 4.62* 5.42*  LATICACIDVEN 3.4*  < > 2.9* 6.3*  --  8.2* 6.9*  --  2.8*  --   --   --   < > = values in this interval not displayed.  Estimated Creatinine Clearance: 18.5 mL/min (by C-G formula based on SCr of 5.42 mg/dL (H)).    Allergies  Allergen Reactions  . Contrast Media [Iodinated Diagnostic Agents] Anaphylaxis, Hives, Swelling and Other (See Comments)    Dye for cardiac cath. Tongue swells  . Pneumococcal Vaccines Swelling    Turns skin black, and bodily swelling  . Shellfish Allergy Itching, Swelling and Other (See Comments)    Facial swelling - Pt able to eat now 11/25/16  . Vancomycin Nausea And Vomiting and Other (See Comments)    Infusion "made me feel like I was dying" had to be readmitted to hospital    Antimicrobials this admission: Levaquin 1/30 >>  Cefepime 2/1 >>  Dose adjustments this admission: ESRD dosing  Microbiology results: 1/30 Bcx: Serratia Marcescens x  2 bottles 1/30 MRSA PCR: negative 1/30 influenza: negative  Thank you for allowing pharmacy to be a part of this patient's care. LExcell Seltzer PharmD Clinical  Pharmacist  01/23/2017 8:49 AM

## 2017-01-23 NOTE — Progress Notes (Signed)
Subjective: She is a little confused this morning. No other new complaints. She is still on pressors.  Objective: Vital signs in last 24 hours: Temp:  [97.7 F (36.5 C)-98.7 F (37.1 C)] 97.7 F (36.5 C) (02/04 0814) Pulse Rate:  [52-100] 81 (02/04 0814) Resp:  [12-28] 21 (02/04 0814) BP: (72-128)/(41-97) 121/85 (02/04 0800) SpO2:  [94 %-99 %] 98 % (02/04 0814) Weight:  [150.1 kg (330 lb 14.6 oz)] 150.1 kg (330 lb 14.6 oz) (02/04 0500) Weight change:  Last BM Date: 01/17/17  Intake/Output from previous day: 02/03 0701 - 02/04 0700 In: 2089.6 [P.O.:580; I.V.:1309.6; IV Piggyback:200] Out: 0   PHYSICAL EXAM General appearance: alert, morbidly obese and Mildly confused Resp: clear to auscultation bilaterally Cardio: regular rate and rhythm, S1, S2 normal, no murmur, click, rub or gallop GI: soft, non-tender; bowel sounds normal; no masses,  no organomegaly Extremities: Chronic lymphedema of both legs. Cellulitis is better. Mucous membranes are moist.  Lab Results:  Results for orders placed or performed during the hospital encounter of 01/18/17 (from the past 48 hour(s))  Glucose, capillary     Status: Abnormal   Collection Time: 01/21/17 11:21 AM  Result Value Ref Range   Glucose-Capillary 139 (H) 65 - 99 mg/dL  Glucose, capillary     Status: Abnormal   Collection Time: 01/21/17  5:07 PM  Result Value Ref Range   Glucose-Capillary 165 (H) 65 - 99 mg/dL  Comprehensive metabolic panel     Status: Abnormal   Collection Time: 01/21/17 10:50 PM  Result Value Ref Range   Sodium 137 135 - 145 mmol/L   Potassium 4.2 3.5 - 5.1 mmol/L   Chloride 99 (L) 101 - 111 mmol/L   CO2 26 22 - 32 mmol/L   Glucose, Bld 125 (H) 65 - 99 mg/dL   BUN 33 (H) 6 - 20 mg/dL   Creatinine, Ser 3.85 (H) 0.44 - 1.00 mg/dL   Calcium 8.4 (L) 8.9 - 10.3 mg/dL   Total Protein 6.5 6.5 - 8.1 g/dL   Albumin 3.1 (L) 3.5 - 5.0 g/dL   AST 2,953 (H) 15 - 41 U/L    Comment: RESULTS CONFIRMED BY MANUAL  DILUTION   ALT 2,516 (H) 14 - 54 U/L   Alkaline Phosphatase 175 (H) 38 - 126 U/L   Total Bilirubin 3.0 (H) 0.3 - 1.2 mg/dL   GFR calc non Af Amer 13 (L) >60 mL/min   GFR calc Af Amer 15 (L) >60 mL/min    Comment: (NOTE) The eGFR has been calculated using the CKD EPI equation. This calculation has not been validated in all clinical situations. eGFR's persistently <60 mL/min signify possible Chronic Kidney Disease.    Anion gap 12 5 - 15  Glucose, capillary     Status: Abnormal   Collection Time: 01/21/17 10:50 PM  Result Value Ref Range   Glucose-Capillary 104 (H) 65 - 99 mg/dL   Comment 1 Notify RN    Comment 2 Document in Chart   .Cooxemetry Panel (carboxy, met, total hgb, O2 sat)     Status: Abnormal   Collection Time: 01/21/17 11:30 PM  Result Value Ref Range   Total hemoglobin 10.8 (L) 12.0 - 16.0 g/dL   O2 Saturation 96.9 %   Carboxyhemoglobin 3.2 (H) 0.5 - 1.5 %    Comment: CRITICAL RESULT CALLED TO, READ BACK BY AND VERIFIED WITH:  NIKKI,RN BY K KNICK RRT,RCP ON 01/21/2017 AT 2343    Methemoglobin 0.9 0.0 - 1.5 %  Total oxygen content 14.3 (L) 15.0 - 23.0 mL/dL  CBC with Differential/Platelet     Status: Abnormal   Collection Time: 01/22/17  6:00 AM  Result Value Ref Range   WBC 13.7 (H) 4.0 - 10.5 K/uL   RBC 3.08 (L) 3.87 - 5.11 MIL/uL   Hemoglobin 10.0 (L) 12.0 - 15.0 g/dL   HCT 29.4 (L) 36.0 - 46.0 %   MCV 95.5 78.0 - 100.0 fL   MCH 32.5 26.0 - 34.0 pg   MCHC 34.0 30.0 - 36.0 g/dL   RDW 15.1 11.5 - 15.5 %   Platelets 92 (L) 150 - 400 K/uL    Comment: SPECIMEN CHECKED FOR CLOTS PLATELET COUNT CONFIRMED BY SMEAR    Neutrophils Relative % 85 %   Neutro Abs 11.6 (H) 1.7 - 7.7 K/uL   Lymphocytes Relative 7 %   Lymphs Abs 0.9 0.7 - 4.0 K/uL   Monocytes Relative 8 %   Monocytes Absolute 1.1 (H) 0.1 - 1.0 K/uL   Eosinophils Relative 0 %   Eosinophils Absolute 0.0 0.0 - 0.7 K/uL   Basophils Relative 0 %   Basophils Absolute 0.0 0.0 - 0.1 K/uL  Comprehensive  metabolic panel     Status: Abnormal   Collection Time: 01/22/17  6:00 AM  Result Value Ref Range   Sodium 133 (L) 135 - 145 mmol/L   Potassium 4.7 3.5 - 5.1 mmol/L   Chloride 98 (L) 101 - 111 mmol/L   CO2 25 22 - 32 mmol/L   Glucose, Bld 152 (H) 65 - 99 mg/dL   BUN 42 (H) 6 - 20 mg/dL   Creatinine, Ser 4.62 (H) 0.44 - 1.00 mg/dL   Calcium 8.2 (L) 8.9 - 10.3 mg/dL   Total Protein 6.4 (L) 6.5 - 8.1 g/dL   Albumin 2.9 (L) 3.5 - 5.0 g/dL   AST 2,283 (H) 15 - 41 U/L    Comment: RESULTS CONFIRMED BY MANUAL DILUTION RESULT CALLED TO, READ BACK BY AND VERIFIED WITH: PHILLIPS,C AT 0806 ON 01/22/2017 BY MOSLEY,J CORRECTED ON 02/03 AT 0805: PREVIOUSLY REPORTED AS 761    ALT 2,202 (H) 14 - 54 U/L    Comment: RESULTS CONFIRMED BY MANUAL DILUTION RESULT CALLED TO, READ BACK BY AND VERIFIED WITH: PHILLIPS,C AT 0806 ON 01/22/2017 BY MOSLEY,J CORRECTED ON 02/03 AT 0805: PREVIOUSLY REPORTED AS 734    Alkaline Phosphatase 183 (H) 38 - 126 U/L   Total Bilirubin 3.0 (H) 0.3 - 1.2 mg/dL   GFR calc non Af Amer 10 (L) >60 mL/min   GFR calc Af Amer 12 (L) >60 mL/min    Comment: (NOTE) The eGFR has been calculated using the CKD EPI equation. This calculation has not been validated in all clinical situations. eGFR's persistently <60 mL/min signify possible Chronic Kidney Disease.    Anion gap 10 5 - 15    Comment: Performed at [facility], [place] [address], [place], [place] [address]  Heparin level (unfractionated)     Status: Abnormal   Collection Time: 01/22/17  6:00 AM  Result Value Ref Range   Heparin Unfractionated <0.10 (L) 0.30 - 0.70 IU/mL    Comment:        IF HEPARIN RESULTS ARE BELOW EXPECTED VALUES, AND PATIENT DOSAGE HAS BEEN CONFIRMED, SUGGEST FOLLOW UP TESTING OF ANTITHROMBIN III LEVELS.   Glucose, capillary     Status: Abnormal   Collection Time: 01/22/17  7:41 AM  Result Value Ref Range   Glucose-Capillary 153 (H) 65 -  99 mg/dL  .Cooxemetry Panel (carboxy,  met, total hgb, O2 sat)     Status: Abnormal   Collection Time: 01/22/17 11:52 AM  Result Value Ref Range   Total hemoglobin 10.7 (L) 12.0 - 16.0 g/dL   O2 Saturation 72.4 %   Carboxyhemoglobin 1.2 0.5 - 1.5 %   Methemoglobin 0.8 0.0 - 1.5 %   Total oxygen content 10.7 (L) 15.0 - 23.0 mL/dL  Glucose, capillary     Status: Abnormal   Collection Time: 01/22/17 11:53 AM  Result Value Ref Range   Glucose-Capillary 160 (H) 65 - 99 mg/dL  Heparin level (unfractionated)     Status: Abnormal   Collection Time: 01/22/17  4:12 PM  Result Value Ref Range   Heparin Unfractionated <0.10 (L) 0.30 - 0.70 IU/mL    Comment:        IF HEPARIN RESULTS ARE BELOW EXPECTED VALUES, AND PATIENT DOSAGE HAS BEEN CONFIRMED, SUGGEST FOLLOW UP TESTING OF ANTITHROMBIN III LEVELS.   Glucose, capillary     Status: Abnormal   Collection Time: 01/22/17  4:19 PM  Result Value Ref Range   Glucose-Capillary 133 (H) 65 - 99 mg/dL  Glucose, capillary     Status: Abnormal   Collection Time: 01/22/17  8:58 PM  Result Value Ref Range   Glucose-Capillary 143 (H) 65 - 99 mg/dL   Comment 1 Notify RN   CBC     Status: Abnormal   Collection Time: 01/23/17  2:41 AM  Result Value Ref Range   WBC 14.6 (H) 4.0 - 10.5 K/uL   RBC 3.09 (L) 3.87 - 5.11 MIL/uL   Hemoglobin 10.0 (L) 12.0 - 15.0 g/dL   HCT 29.3 (L) 36.0 - 46.0 %   MCV 94.8 78.0 - 100.0 fL   MCH 32.4 26.0 - 34.0 pg   MCHC 34.1 30.0 - 36.0 g/dL   RDW 14.9 11.5 - 15.5 %   Platelets 116 (L) 150 - 400 K/uL    Comment: SPECIMEN CHECKED FOR CLOTS  Comprehensive metabolic panel     Status: Abnormal   Collection Time: 01/23/17  2:41 AM  Result Value Ref Range   Sodium 131 (L) 135 - 145 mmol/L   Potassium 4.8 3.5 - 5.1 mmol/L   Chloride 95 (L) 101 - 111 mmol/L   CO2 23 22 - 32 mmol/L   Glucose, Bld 156 (H) 65 - 99 mg/dL   BUN 55 (H) 6 - 20 mg/dL   Creatinine, Ser 5.42 (H) 0.44 - 1.00 mg/dL   Calcium 8.7 (L) 8.9 - 10.3 mg/dL   Total Protein 6.0 (L) 6.5 - 8.1  g/dL   Albumin 2.6 (L) 3.5 - 5.0 g/dL   AST 1,603 (H) 15 - 41 U/L   ALT 2,049 (H) 14 - 54 U/L   Alkaline Phosphatase 217 (H) 38 - 126 U/L   Total Bilirubin 2.9 (H) 0.3 - 1.2 mg/dL   GFR calc non Af Amer 8 (L) >60 mL/min   GFR calc Af Amer 10 (L) >60 mL/min    Comment: (NOTE) The eGFR has been calculated using the CKD EPI equation. This calculation has not been validated in all clinical situations. eGFR's persistently <60 mL/min signify possible Chronic Kidney Disease.    Anion gap 13 5 - 15  Heparin level (unfractionated)     Status: Abnormal   Collection Time: 01/23/17  2:41 AM  Result Value Ref Range   Heparin Unfractionated <0.10 (L) 0.30 - 0.70 IU/mL    Comment:  IF HEPARIN RESULTS ARE BELOW EXPECTED VALUES, AND PATIENT DOSAGE HAS BEEN CONFIRMED, SUGGEST FOLLOW UP TESTING OF ANTITHROMBIN III LEVELS.   Glucose, capillary     Status: Abnormal   Collection Time: 01/23/17  8:12 AM  Result Value Ref Range   Glucose-Capillary 144 (H) 65 - 99 mg/dL    ABGS No results for input(s): PHART, PO2ART, TCO2, HCO3 in the last 72 hours.  Invalid input(s): PCO2 CULTURES Recent Results (from the past 240 hour(s))  Culture, blood (routine x 2)     Status: Abnormal   Collection Time: 01/18/17  3:41 PM  Result Value Ref Range Status   Specimen Description LEFT ANTECUBITAL  Final   Special Requests BOTTLES DRAWN AEROBIC AND ANAEROBIC 6CC EACH  Final   Culture  Setup Time   Final    GRAM NEGATIVE RODS AEROBIC AND ANAEROBIC BOTTLES Gram Stain Report Called to,Read Back By and Verified With: HOWARD,C AT 0725 BY HUFFINES,S ON 01/19/17    Culture (A)  Final    SERRATIA MARCESCENS SUSCEPTIBILITIES PERFORMED ON PREVIOUS CULTURE WITHIN THE LAST 5 DAYS. Performed at [facility], [place] [address] [place], [place] [address]    Report Status 01/21/2017 FINAL  Final  Culture, blood (routine x 2)     Status: Abnormal   Collection Time: 01/18/17  3:41 PM  Result Value Ref Range  Status   Specimen Description BLOOD LEFT HAND  Final   Special Requests BOTTLES DRAWN AEROBIC AND ANAEROBIC [facility] EACH  Final   Culture  Setup Time   Final    GRAM NEGATIVE RODS AEROBIC AND ANAEROBIC BOTTLES Gram Stain Report Called to,Read Back By and Verified With: HOWARD,C AT 4010 BY HUFFINES,S ON 01/19/17. CRITICAL RESULT CALLED TO, READ BACK BY AND VERIFIED WITHJuel Burrow PHARMD 1535 01/19/17 A BROWNING Performed at [facility], [place] [address] , [place] [address]    Culture SERRATIA MARCESCENS (A)  Final   Report Status 01/21/2017 FINAL  Final   Organism ID, Bacteria SERRATIA MARCESCENS  Final      Susceptibility   Serratia marcescens - MIC*    CEFAZOLIN >=64 RESISTANT Resistant     CEFEPIME <=1 SENSITIVE Sensitive     CEFTAZIDIME <=1 SENSITIVE Sensitive     CEFTRIAXONE <=1 SENSITIVE Sensitive     CIPROFLOXACIN <=0.25 SENSITIVE Sensitive     GENTAMICIN <=1 SENSITIVE Sensitive     TRIMETH/SULFA <=20 SENSITIVE Sensitive     * SERRATIA MARCESCENS  Blood Culture ID Panel (Reflexed)     Status: Abnormal   Collection Time: 01/18/17  3:41 PM  Result Value Ref Range Status   Enterococcus species NOT DETECTED NOT DETECTED Final   Listeria monocytogenes NOT DETECTED NOT DETECTED Final   Staphylococcus species NOT DETECTED NOT DETECTED Final   Staphylococcus aureus NOT DETECTED NOT DETECTED Final   Streptococcus species NOT DETECTED NOT DETECTED Final   Streptococcus agalactiae NOT DETECTED NOT DETECTED Final   Streptococcus pneumoniae NOT DETECTED NOT DETECTED Final   Streptococcus pyogenes NOT DETECTED NOT DETECTED Final   Acinetobacter baumannii NOT DETECTED NOT DETECTED Final   Enterobacteriaceae species DETECTED (A) NOT DETECTED Final    Comment: Enterobacteriaceae represent a large family of gram-negative bacteria, not a single organism. CRITICAL RESULT CALLED TO, READ BACK BY AND VERIFIED WITH: S HALL PHARMD 1535 01/19/17 A BROWNING    Enterobacter cloacae complex NOT  DETECTED NOT DETECTED Final   Escherichia coli NOT DETECTED NOT DETECTED Final   Klebsiella oxytoca NOT DETECTED  NOT DETECTED Final   Klebsiella pneumoniae NOT DETECTED NOT DETECTED Final   Proteus species NOT DETECTED NOT DETECTED Final   Serratia marcescens DETECTED (A) NOT DETECTED Final    Comment: CRITICAL RESULT CALLED TO, READ BACK BY AND VERIFIED WITH: S HALL PHARMD 1535 01/19/17 A BROWNING    Carbapenem resistance NOT DETECTED NOT DETECTED Final   Haemophilus influenzae NOT DETECTED NOT DETECTED Final   Neisseria meningitidis NOT DETECTED NOT DETECTED Final   Pseudomonas aeruginosa NOT DETECTED NOT DETECTED Final   Candida albicans NOT DETECTED NOT DETECTED Final   Candida glabrata NOT DETECTED NOT DETECTED Final   Candida krusei NOT DETECTED NOT DETECTED Final   Candida parapsilosis NOT DETECTED NOT DETECTED Final   Candida tropicalis NOT DETECTED NOT DETECTED Final    Comment: Performed at [facility], [place] [address] [place], [place] [address]  MRSA PCR Screening     Status: None   Collection Time: 01/18/17 10:25 PM  Result Value Ref Range Status   MRSA by PCR NEGATIVE NEGATIVE Final    Comment:        The GeneXpert MRSA Assay (FDA approved for NASAL specimens only), is one component of a comprehensive MRSA colonization surveillance program. It is not intended to diagnose MRSA infection nor to guide or monitor treatment for MRSA infections.    Studies/Results: No results found.  Medications:  Prior to Admission:  Prescriptions Prior to Admission  Medication Sig Dispense Refill Last Dose  . acetaminophen (TYLENOL) 325 MG tablet Take 650 mg by mouth every 6 (six) hours as needed for moderate pain or fever.    01/17/2017 at Unknown time  . albuterol (PROVENTIL) (2.5 MG/3ML) 0.083% nebulizer solution Take 2.5 mg by nebulization every 6 (six) hours as needed for wheezing or shortness of breath.   Past Month at Unknown time  . ALPRAZolam (XANAX) 0.5 MG tablet  Take 0.5 mg by mouth 3 (three) times daily as needed for anxiety or sleep.    01/17/2017 at Unknown time  . amLODipine (NORVASC) 10 MG tablet Take 0.5 tablets (5 mg total) by mouth daily. 15 tablet 12 01/17/2017 at Unknown time  . cinacalcet (SENSIPAR) 30 MG tablet Take 30 mg by mouth daily.   01/17/2017 at Unknown time  . diphenhydrAMINE (BENADRYL) 25 mg capsule Take 25 mg by mouth every 6 (six) hours as needed for allergies.   unknown  . Insulin Glargine (LANTUS SOLOSTAR) 100 UNIT/ML Solostar Pen Inject 10 Units into the skin daily at 10 pm. 15 mL 11 01/17/2017 at Unknown time  . metoprolol (LOPRESSOR) 50 MG tablet Take 0.5 tablets (25 mg total) by mouth 2 (two) times daily. 30 tablet 12 01/17/2017 at Unknown time  . multivitamin (RENA-VIT) TABS tablet Take 1 tablet by mouth daily.   01/17/2017 at Unknown time  . Oxycodone HCl 10 MG TABS Take 1 tablet (10 mg total) by mouth 3 (three) times daily as needed (pain). (Patient taking differently: Take 10 mg by mouth 4 (four) times daily as needed (pain). ) 15 tablet 0 unknown  . sevelamer carbonate (RENVELA) 800 MG tablet Take 1,600-3,200 mg by mouth See admin instructions. Take 3200 mg by mouth 3 times daily with meals and take 1600 mg by mouth with snacks.   01/17/2017 at Unknown time  . doxycycline (VIBRAMYCIN) 50 MG capsule Take 2 capsules (100 mg total) by mouth 2 (two) times daily. (Patient not taking: Reported on 01/18/2017) 20 capsule 0 Not Taking at Unknown time  .  predniSONE (STERAPRED UNI-PAK 21 TAB) 10 MG (21) TBPK tablet Take by package instructions (Patient not taking: Reported on 01/18/2017) 21 tablet 0 Not Taking at Unknown time   Scheduled: . ceFEPime (MAXIPIME) IV  2 g Intravenous Q M,W,F-HD  . Chlorhexidine Gluconate Cloth  6 each Topical Daily  . epoetin (EPOGEN/PROCRIT) injection  8,000 Units Intravenous Q M,W,F-HD  . insulin aspart  0-9 Units Subcutaneous TID WC  . linaclotide  145 mcg Oral QAC breakfast  . mouth rinse  15 mL Mouth Rinse  BID  . predniSONE  50 mg Oral Q6H  . sodium chloride flush  10-40 mL Intracatheter Q12H   Continuous: . heparin 2,400 Units/hr (01/23/17 0913)  . phenylephrine ([name]-SYNEPHRINE) Adult infusion 75 mcg/min (01/23/17 0911)   DUK:RCVKFM chloride, sodium chloride, sodium chloride, sodium chloride, acetaminophen, albuterol, ALPRAZolam, alteplase, guaiFENesin-dextromethorphan, heparin, levalbuterol, lidocaine (PF), lidocaine (PF), lidocaine-prilocaine, ondansetron **OR** ondansetron (ZOFRAN) IV, oxyCODONE, pentafluoroprop-tetrafluoroeth, sodium chloride flush  Assesment: She was admitted with cellulitis and sepsis. There was concern that she may have had pulmonary emboli because she is shown to have right heart failure. She has chronic hypoxic respiratory failure and she has morbid obesity with obesity hypoventilation. She has history of contrast media allergy and it's not quite clear to me what she had. She reported to me that her arm swelled up but she reported to nursing staff that her tongue swelled up. She can't give me an answer now because of her confusion Principal Problem:   Fever Active Problems:   Diabetes mellitus ([place])   Morbid obesity ([place])   Obesity hypoventilation syndrome ([facility])   Chronic diastolic CHF (congestive heart failure) (H[facility])   Chronic respiratory failure with hypoxia (H[facility])   ESRD on dialysis ([facility])   Flu-like symptoms   Elevated troponin    Plan: Plan for now is to do CT angiogram of the chest. Will discuss with cardiology tomorrow before the test is done because if she needs chronic anticoagulation anyway although it would be helpful to know if she has pulmonary emboli it may not be worth the risk of the procedure.    LOS: 5 days   Jessic Standifer L 01/23/2017, 9:17 AM

## 2017-01-23 NOTE — Progress Notes (Signed)
[name] for heparin Indication: cor pulmonale, r/o PE  Allergies  Allergen Reactions  . Contrast Media [Iodinated Diagnostic Agents] Anaphylaxis, Hives, Swelling and Other (See Comments)    Dye for cardiac cath. Tongue swells  . Pneumococcal Vaccines Swelling    Turns skin black, and bodily swelling  . Shellfish Allergy Itching, Swelling and Other (See Comments)    Facial swelling - Pt able to eat now 11/25/16  . Vancomycin Nausea And Vomiting and Other (See Comments)    Infusion "made me feel like I was dying" had to be readmitted to hospital    Patient Measurements: Height: _0  (165.1 cm) Weight: (!) 330 lb 14.6 oz (150.1 kg) IBW/kg (Calculated) : 57 Heparin Dosing Weight: 93 kg  Vital Signs: Temp: 98.6 F (37 C) (02/04 1956) Temp Source: Oral (02/04 1956) BP: 103/64 (02/04 2130) Pulse Rate: 77 (02/04 2130)  Labs:  Recent Labs  01/21/17 0800 01/21/17 2250 01/22/17 0600  01/23/17 0241 01/23/17 1451 01/23/17 1830  HGB 10.3*  --  10.0*  --  10.0*  --   --   HCT 30.5*  --  29.4*  --  29.3*  --   --   PLT 87*  --  92*  --  116*  --   --   HEPARINUNFRC  --   --  <0.10*  < > <0.10* 1.10* <0.10*  CREATININE  --  3.85* 4.62*  --  5.42*  --   --   TROPONINI 5.31*  --   --   --   --   --   --   < > = values in this interval not displayed.  Estimated Creatinine Clearance: 18.5 mL/min (by C-G formula based on SCr of 5.42 mg/dL (H)).   Medical History: Past Medical History:  Diagnosis Date  . Anxiety   . Asthma    as a child  . Cancer ([facility])    thyroid  . Cellulitis   . CHF (congestive heart failure) ([place])   . Chiari malformation    s/p surgery  . Chiari malformation   . Chronic pain   . Complication of anesthesia 11/28/15   Resp arrest after  conscious  sedation  . Constipation   . Coronary artery disease    40-50% mid LAD 04/29/09, Medical tx. (Dr. Gwenlyn Found)  . Diabetes mellitus    Type II  . Fibromyalgia   . History  of blood transfusion    hemorrage duinrg pregancy  . Hx of echocardiogram 10/2011   EF 55-60%  . Hypercholesterolemia   . Hypertension   . Lymph edema   . Obesity hypoventilation syndrome ([place])   . On home O2   . Pneumonia    in past  . Renal disorder    Pt started dialysis in Dec.2016  . S/P colonoscopy 05/26/2007   Dr. Laural Golden sigmoid diverticulosis random biopsies benign  . S/P endoscopy 05/01/2009   Dr. Penelope Coop pill-induced esophageal ulcerations distal to midesophagus, 2 small ulcers in the antrum of the stomach  . Shortness of breath dyspnea    with any exertion or if heart rate  is irregular while on dialysis  . Sleep apnea     Medications:  Prescriptions Prior to Admission  Medication Sig Dispense Refill Last Dose  . acetaminophen (TYLENOL) 325 MG tablet Take 650 mg by mouth every 6 (six) hours as needed for moderate pain or fever.    01/17/2017 at Unknown time  . albuterol (PROVENTIL) (2.5  MG/3ML) 0.083% nebulizer solution Take 2.5 mg by nebulization every 6 (six) hours as needed for wheezing or shortness of breath.   Past Month at Unknown time  . ALPRAZolam (XANAX) 0.5 MG tablet Take 0.5 mg by mouth 3 (three) times daily as needed for anxiety or sleep.    01/17/2017 at Unknown time  . amLODipine (NORVASC) 10 MG tablet Take 0.5 tablets (5 mg total) by mouth daily. 15 tablet 12 01/17/2017 at Unknown time  . cinacalcet (SENSIPAR) 30 MG tablet Take 30 mg by mouth daily.   01/17/2017 at Unknown time  . diphenhydrAMINE (BENADRYL) 25 mg capsule Take 25 mg by mouth every 6 (six) hours as needed for allergies.   unknown  . Insulin Glargine (LANTUS SOLOSTAR) 100 UNIT/ML Solostar Pen Inject 10 Units into the skin daily at 10 pm. 15 mL 11 01/17/2017 at Unknown time  . metoprolol (LOPRESSOR) 50 MG tablet Take 0.5 tablets (25 mg total) by mouth 2 (two) times daily. 30 tablet 12 01/17/2017 at Unknown time  . multivitamin (RENA-VIT) TABS tablet Take 1 tablet by mouth daily.   01/17/2017 at Unknown  time  . Oxycodone HCl 10 MG TABS Take 1 tablet (10 mg total) by mouth 3 (three) times daily as needed (pain). (Patient taking differently: Take 10 mg by mouth 4 (four) times daily as needed (pain). ) 15 tablet 0 unknown  . sevelamer carbonate (RENVELA) 800 MG tablet Take 1,600-3,200 mg by mouth See admin instructions. Take 3200 mg by mouth 3 times daily with meals and take 1600 mg by mouth with snacks.   01/17/2017 at Unknown time  . doxycycline (VIBRAMYCIN) 50 MG capsule Take 2 capsules (100 mg total) by mouth 2 (two) times daily. (Patient not taking: Reported on 01/18/2017) 20 capsule 0 Not Taking at Unknown time  . predniSONE (STERAPRED UNI-PAK 21 TAB) 10 MG (21) TBPK tablet Take by package instructions (Patient not taking: Reported on 01/18/2017) 21 tablet 0 Not Taking at Unknown time    Assessment: 51 yo lady to start heparin for cor pulmonale, r/o PE. Her heparin level reported as 1.1 units/ml (drawn from catheter) redraw < 0.1 units/ml. Goal of Therapy:  Heparin level 0.3-0.7 units/ml Monitor platelets by anticoagulation protocol: Yes   Plan:  Heparin bolus 2000 units and increase drip at 2700 units/hr Daily HL and CBC Monitor for bleeding complications  Thanks for allowing pharmacy to be a part of this patient's care.  Excell Seltzer, PharmD Clinical Pharmacist  01/23/2017,9:40 PM

## 2017-01-24 ENCOUNTER — Inpatient Hospital Stay (HOSPITAL_COMMUNITY): Payer: Medicare Other

## 2017-01-24 DIAGNOSIS — I2781 Cor pulmonale (chronic): Secondary | ICD-10-CM | POA: Diagnosis present

## 2017-01-24 DIAGNOSIS — I50812 Chronic right heart failure: Secondary | ICD-10-CM | POA: Diagnosis present

## 2017-01-24 DIAGNOSIS — K7200 Acute and subacute hepatic failure without coma: Secondary | ICD-10-CM | POA: Diagnosis not present

## 2017-01-24 DIAGNOSIS — R6521 Severe sepsis with septic shock: Secondary | ICD-10-CM

## 2017-01-24 DIAGNOSIS — A419 Sepsis, unspecified organism: Secondary | ICD-10-CM | POA: Diagnosis present

## 2017-01-24 LAB — HEPATIC FUNCTION PANEL
ALK PHOS: 244 U/L — AB (ref 38–126)
ALT: 1966 U/L — AB (ref 14–54)
AST: 1105 U/L — ABNORMAL HIGH (ref 15–41)
Albumin: 2.6 g/dL — ABNORMAL LOW (ref 3.5–5.0)
BILIRUBIN INDIRECT: 1.2 mg/dL — AB (ref 0.3–0.9)
Bilirubin, Direct: 1.6 mg/dL — ABNORMAL HIGH (ref 0.1–0.5)
TOTAL PROTEIN: 6.4 g/dL — AB (ref 6.5–8.1)
Total Bilirubin: 2.8 mg/dL — ABNORMAL HIGH (ref 0.3–1.2)

## 2017-01-24 LAB — RENAL FUNCTION PANEL
ALBUMIN: 2.6 g/dL — AB (ref 3.5–5.0)
ANION GAP: 14 (ref 5–15)
BUN: 71 mg/dL — ABNORMAL HIGH (ref 6–20)
CHLORIDE: 95 mmol/L — AB (ref 101–111)
CO2: 21 mmol/L — ABNORMAL LOW (ref 22–32)
Calcium: 8.7 mg/dL — ABNORMAL LOW (ref 8.9–10.3)
Creatinine, Ser: 6.45 mg/dL — ABNORMAL HIGH (ref 0.44–1.00)
GFR calc Af Amer: 8 mL/min — ABNORMAL LOW (ref >60)
GFR calc non Af Amer: 7 mL/min — ABNORMAL LOW (ref >60)
GLUCOSE: 230 mg/dL — AB (ref 65–99)
PHOSPHORUS: 8.2 mg/dL — AB (ref 2.5–4.6)
POTASSIUM: 5.7 mmol/L — AB (ref 3.5–5.1)
Sodium: 130 mmol/L — ABNORMAL LOW (ref 135–145)

## 2017-01-24 LAB — CBC
HEMATOCRIT: 30.9 % — AB (ref 36.0–46.0)
Hemoglobin: 10.6 g/dL — ABNORMAL LOW (ref 12.0–15.0)
MCH: 32.3 pg (ref 26.0–34.0)
MCHC: 34.3 g/dL (ref 30.0–36.0)
MCV: 94.2 fL (ref 78.0–100.0)
Platelets: 164 10*3/uL (ref 150–400)
RBC: 3.28 MIL/uL — ABNORMAL LOW (ref 3.87–5.11)
RDW: 14.9 % (ref 11.5–15.5)
WBC: 19.2 10*3/uL — AB (ref 4.0–10.5)

## 2017-01-24 LAB — D-DIMER, QUANTITATIVE: D-Dimer, Quant: 7.87 ug/mL-FEU — ABNORMAL HIGH (ref 0.00–0.50)

## 2017-01-24 LAB — BLOOD GAS, ARTERIAL
Acid-base deficit: 6.3 mmol/L — ABNORMAL HIGH (ref 0.0–2.0)
Bicarbonate: 19 mmol/L — ABNORMAL LOW (ref 20.0–28.0)
DRAWN BY: 270161
O2 Content: 2 L/min
O2 Saturation: 94.3 %
PH ART: 7.29 — AB (ref 7.350–7.450)
Patient temperature: 37
pCO2 arterial: 41 mmHg (ref 32.0–48.0)
pO2, Arterial: 85 mmHg (ref 83.0–108.0)

## 2017-01-24 LAB — GLUCOSE, CAPILLARY
GLUCOSE-CAPILLARY: 224 mg/dL — AB (ref 65–99)
GLUCOSE-CAPILLARY: 196 mg/dL — AB (ref 65–99)
GLUCOSE-CAPILLARY: 189 mg/dL — AB (ref 65–99)
Glucose-Capillary: 236 mg/dL — ABNORMAL HIGH (ref 65–99)

## 2017-01-24 LAB — HEPARIN LEVEL (UNFRACTIONATED): Heparin Unfractionated: 0.46 IU/mL (ref 0.30–0.70)

## 2017-01-24 LAB — AMMONIA: Ammonia: 40 umol/L — ABNORMAL HIGH (ref 9–35)

## 2017-01-24 MED ORDER — WARFARIN SODIUM 7.5 MG PO TABS
7.5000 mg | ORAL_TABLET | Freq: Once | ORAL | Status: AC
  Administered 2017-01-24: 7.5 mg via ORAL
  Filled 2017-01-24: qty 1

## 2017-01-24 MED ORDER — HEPARIN SODIUM (PORCINE) 1000 UNIT/ML DIALYSIS
1000.0000 [IU] | INTRAMUSCULAR | Status: DC | PRN
  Filled 2017-01-24: qty 1

## 2017-01-24 MED ORDER — SODIUM CHLORIDE 0.9 % IV SOLN: 100.0000 mL | INTRAVENOUS | Status: DC | PRN

## 2017-01-24 MED ORDER — ALTEPLASE 2 MG IJ SOLR
2.0000 mg | Freq: Once | INTRAMUSCULAR | Status: DC | PRN
  Filled 2017-01-24: qty 2

## 2017-01-24 MED ORDER — EPOETIN ALFA 4000 UNIT/ML IJ SOLN
INTRAMUSCULAR | Status: AC
  Administered 2017-01-24: 8000 [IU] via INTRAVENOUS
  Filled 2017-01-24: qty 2

## 2017-01-24 MED ORDER — WARFARIN - PHARMACIST DOSING INPATIENT
Freq: Every day | Status: DC
  Administered 2017-01-27: 1
  Administered 2017-01-31: 16:00:00

## 2017-01-24 MED ORDER — LACTULOSE 10 GM/15ML PO SOLN
30.0000 g | Freq: Two times a day (BID) | ORAL | Status: DC
  Administered 2017-01-24: 30 g via ORAL
  Filled 2017-01-24: qty 60

## 2017-01-24 NOTE — Progress Notes (Signed)
[name] for heparin and coumadin Indication: cor pulmonale, r/o PE  Allergies  Allergen Reactions  . Contrast Media [Iodinated Diagnostic Agents] Anaphylaxis, Hives, Swelling and Other (See Comments)    Dye for cardiac cath. Tongue swells  . Pneumococcal Vaccines Swelling    Turns skin black, and bodily swelling  . Shellfish Allergy Itching, Swelling and Other (See Comments)    Facial swelling - Pt able to eat now 11/25/16  . Vancomycin Nausea And Vomiting and Other (See Comments)    Infusion "made me feel like I was dying" had to be readmitted to hospital    Patient Measurements: Height: _0  (165.1 cm) Weight: (!) 334 lb 14.1 oz (151.9 kg) IBW/kg (Calculated) : 57 Heparin Dosing Weight: 93 kg  Vital Signs: Temp: 97.8 F (36.6 C) (02/05 0757) Temp Source: Oral (02/05 0757) BP: 146/85 (02/05 0645) Pulse Rate: 71 (02/05 0757)  Labs:  Recent Labs  01/22/17 0600  01/23/17 0241 01/23/17 1451 01/23/17 1830 01/24/17 0419  HGB 10.0*  --  10.0*  --   --  10.6*  HCT 29.4*  --  29.3*  --   --  30.9*  PLT 92*  --  116*  --   --  164  HEPARINUNFRC <0.10*  < > <0.10* 1.10* <0.10* 0.46  CREATININE 4.62*  --  5.42*  --   --  6.45*  < > = values in this interval not displayed.  Estimated Creatinine Clearance: 15.6 mL/min (by C-G formula based on SCr of 6.45 mg/dL (H)).   Medical History: Past Medical History:  Diagnosis Date  . Anxiety   . Asthma    as a child  . Cancer ([place])    thyroid  . Cellulitis   . CHF (congestive heart failure) ([place])   . Chiari malformation    s/p surgery  . Chiari malformation   . Chronic pain   . Complication of anesthesia 11/28/15   Resp arrest after  conscious  sedation  . Constipation   . Coronary artery disease    40-50% mid LAD 04/29/09, Medical tx. (Dr. Gwenlyn Found)  . Diabetes mellitus    Type II  . Fibromyalgia   . History of blood transfusion    hemorrage duinrg pregancy  . Hx of  echocardiogram 10/2011   EF 55-60%  . Hypercholesterolemia   . Hypertension   . Lymph edema   . Obesity hypoventilation syndrome ([place])   . On home O2   . Pneumonia    in past  . Renal disorder    Pt started dialysis in Dec.2016  . S/P colonoscopy 05/26/2007   Dr. Laural Golden sigmoid diverticulosis random biopsies benign  . S/P endoscopy 05/01/2009   Dr. Penelope Coop pill-induced esophageal ulcerations distal to midesophagus, 2 small ulcers in the antrum of the stomach  . Shortness of breath dyspnea    with any exertion or if heart rate  is irregular while on dialysis  . Sleep apnea     Medications:  Prescriptions Prior to Admission  Medication Sig Dispense Refill Last Dose  . acetaminophen (TYLENOL) 325 MG tablet Take 650 mg by mouth every 6 (six) hours as needed for moderate pain or fever.    01/17/2017 at Unknown time  . albuterol (PROVENTIL) (2.5 MG/3ML) 0.083% nebulizer solution Take 2.5 mg by nebulization every 6 (six) hours as needed for wheezing or shortness of breath.   Past Month at Unknown time  . ALPRAZolam (XANAX) 0.5 MG tablet Take 0.5  mg by mouth 3 (three) times daily as needed for anxiety or sleep.    01/17/2017 at Unknown time  . amLODipine (NORVASC) 10 MG tablet Take 0.5 tablets (5 mg total) by mouth daily. 15 tablet 12 01/17/2017 at Unknown time  . cinacalcet (SENSIPAR) 30 MG tablet Take 30 mg by mouth daily.   01/17/2017 at Unknown time  . diphenhydrAMINE (BENADRYL) 25 mg capsule Take 25 mg by mouth every 6 (six) hours as needed for allergies.   unknown  . Insulin Glargine (LANTUS SOLOSTAR) 100 UNIT/ML Solostar Pen Inject 10 Units into the skin daily at 10 pm. 15 mL 11 01/17/2017 at Unknown time  . metoprolol (LOPRESSOR) 50 MG tablet Take 0.5 tablets (25 mg total) by mouth 2 (two) times daily. 30 tablet 12 01/17/2017 at Unknown time  . multivitamin (RENA-VIT) TABS tablet Take 1 tablet by mouth daily.   01/17/2017 at Unknown time  . Oxycodone HCl 10 MG TABS Take 1 tablet (10 mg total)  by mouth 3 (three) times daily as needed (pain). (Patient taking differently: Take 10 mg by mouth 4 (four) times daily as needed (pain). ) 15 tablet 0 unknown  . sevelamer carbonate (RENVELA) 800 MG tablet Take 1,600-3,200 mg by mouth See admin instructions. Take 3200 mg by mouth 3 times daily with meals and take 1600 mg by mouth with snacks.   01/17/2017 at Unknown time  . doxycycline (VIBRAMYCIN) 50 MG capsule Take 2 capsules (100 mg total) by mouth 2 (two) times daily. (Patient not taking: Reported on 01/18/2017) 20 capsule 0 Not Taking at Unknown time  . predniSONE (STERAPRED UNI-PAK 21 TAB) 10 MG (21) TBPK tablet Take by package instructions (Patient not taking: Reported on 01/18/2017) 21 tablet 0 Not Taking at Unknown time    Assessment: 51 yo lady to start heparin for cor pulmonale, r/o PE. Her heparin level reported as 1.1 units/ml (drawn from catheter) redraw < 0.1 units/ml. Heparin level this am is therapeutic.  MD is ordering ventilation/perfusion lung scan. Also, asked to start Coumadin today. Baseline INR 01/18/2017 is 1.03 Goal of Therapy:  Heparin level 0.3-0.7 units/ml Monitor platelets by anticoagulation protocol: Yes   Plan:  Coumadin 7.13m today x 1 Continue Heparin at 2700 units/hr Daily HL and CBC Monitor for bleeding complications  Thanks for allowing pharmacy to be a part of this patient's care.  LIsac Sarna BS PVena [place] BCPS Clinical Pharmacist Pager #[phone]/04/2017,8:31 AM

## 2017-01-24 NOTE — Progress Notes (Addendum)
Subjective: Interval History: Patient is slightly feeling better today. Still she has some nausea but no vomiting. Her breathing is okay  Objective: Vital signs in last 24 hours: Temp:  [97.8 F (36.6 C)-99.2 F (37.3 C)] 97.8 F (36.6 C) (02/05 0757) Pulse Rate:  [66-90] 71 (02/05 0757) Resp:  [14-24] 20 (02/05 0757) BP: (78-167)/(64-143) 146/85 (02/05 0645) SpO2:  [93 %-100 %] 99 % (02/05 0757) Weight:  [151.9 kg (334 lb 14.1 oz)] 151.9 kg (334 lb 14.1 oz) (02/05 0500) Weight change: 1.8 kg (3 lb 15.5 oz)  Intake/Output from previous day: 02/04 0701 - 02/05 0700 In: 1911.9 [P.O.:720; I.V.:1191.9] Out: 300 [Urine:300] Intake/Output this shift: Total I/O In: 120 [P.O.:120] Out: -   General appearance: alert, cooperative and no distress Resp: diminished breath sounds anterior - bilateral and wheezes bilaterally Cardio: regular rate and rhythm Extremities: edema Patient with venous stasis and chronic leg edema  Lab Results:  Recent Labs  01/23/17 0241 01/24/17 0419  WBC 14.6* 19.2*  HGB 10.0* 10.6*  HCT 29.3* 30.9*  PLT 116* 164   BMET:   Recent Labs  01/23/17 0241 01/24/17 0419  NA 131* 130*  K 4.8 5.7*  CL 95* 95*  CO2 23 21*  GLUCOSE 156* 230*  BUN 55* 71*  CREATININE 5.42* 6.45*  CALCIUM 8.7* 8.7*   No results for input(s): PTH in the last 72 hours. Iron Studies: No results for input(s): IRON, TIBC, TRANSFERRIN, FERRITIN in the last 72 hours.  Studies/Results: No results found.  I have reviewed the patient's current medications.  Assessment/Plan: Problem #1 sepsis: Presently patient with fever, elevated white blood cell count. Blood culture is growing Serratia and Enterobacter. Patient is on antibiotics. P Problem #2 end-stage renal disease: She is status post hemodialysis  on Friday. Patient is due for dialysis today. Her potassium is high. Problem #3 anemia: Her hemoglobin is with in our target goal. Patient is on Epogen Problem #4 fluid  management: Patient denies any difficulty breathing. Problem #5 metabolic bone disease: Calcium is in range but her phosphorus is high. Presently patient is on no binder but unable to take because of nausea and vomiting. Problem # 6 sleep apnea: Not on CPAP Problem #7 morbid obesity Problem #8 diabetes: Her blood sugar is slightly high today. Problem #9 elevated LFTs: Possibly ischemic Plan: We'll make arrangements for patient to get dialysis today. We'll use 2K/2.5 calcium bath We will remove about 4-7/4 L if systolic blood pressure remains above 90 We'll check her CBC and renal panel in the morning.    LOS: 6 days   Renatta Shrieves S 01/24/2017,10:17 AM

## 2017-01-24 NOTE — Progress Notes (Signed)
Subjective: She remains in the intensive care unit on pressors but her blood pressure is better and the [name]-Synephrine is being titrated off. She is still confused. She is better than yesterday. She says her legs feel like they're more swollen. She's not having as much nausea. She has still not had a bowel movement despite taking Linzess No chest pain. She says she is not as short of breath. Objective: Vital signs in last 24 hours: Temp:  [97.7 F (36.5 C)-99.2 F (37.3 C)] 97.8 F (36.6 C) (02/05 0757) Pulse Rate:  [66-90] 71 (02/05 0757) Resp:  [14-24] 20 (02/05 0757) BP: (78-167)/(64-143) 146/85 (02/05 0645) SpO2:  [93 %-100 %] 99 % (02/05 0757) Weight:  [151.9 kg (334 lb 14.1 oz)] 151.9 kg (334 lb 14.1 oz) (02/05 0500) Weight change: 1.8 kg (3 lb 15.5 oz) Last BM Date:  (unknown)  Intake/Output from previous day: 02/04 0701 - 02/05 0700 In: 1911.9 [P.O.:720; I.V.:1191.9] Out: 300 [Urine:300]  PHYSICAL EXAM General appearance: alert, morbidly obese and Still mildly confused and slow to react Resp: Normal respiratory effort. Decreased breath sounds bilaterally Cardio: Her heart is regular. She has chronic lymphedema of both legs GI: Her abdomen is soft. Her liver is not palpable Extremities: Chronic lymphedema of both legs. The cellulitis on her left leg is improving Pupils react. EOMI. Mucous membranes are moist  Lab Results:  Results for orders placed or performed during the hospital encounter of 01/18/17 (from the past 48 hour(s))  .Cooxemetry Panel (carboxy, met, total hgb, O2 sat)     Status: Abnormal   Collection Time: 01/22/17 11:52 AM  Result Value Ref Range   Total hemoglobin 10.7 (L) 12.0 - 16.0 g/dL   O2 Saturation 72.4 %   Carboxyhemoglobin 1.2 0.5 - 1.5 %   Methemoglobin 0.8 0.0 - 1.5 %   Total oxygen content 10.7 (L) 15.0 - 23.0 mL/dL  Glucose, capillary     Status: Abnormal   Collection Time: 01/22/17 11:53 AM  Result Value Ref Range   Glucose-Capillary  160 (H) 65 - 99 mg/dL  Heparin level (unfractionated)     Status: Abnormal   Collection Time: 01/22/17  4:12 PM  Result Value Ref Range   Heparin Unfractionated <0.10 (L) 0.30 - 0.70 IU/mL    Comment:        IF HEPARIN RESULTS ARE BELOW EXPECTED VALUES, AND PATIENT DOSAGE HAS BEEN CONFIRMED, SUGGEST FOLLOW UP TESTING OF ANTITHROMBIN III LEVELS.   Glucose, capillary     Status: Abnormal   Collection Time: 01/22/17  4:19 PM  Result Value Ref Range   Glucose-Capillary 133 (H) 65 - 99 mg/dL  Glucose, capillary     Status: Abnormal   Collection Time: 01/22/17  8:58 PM  Result Value Ref Range   Glucose-Capillary 143 (H) 65 - 99 mg/dL   Comment 1 Notify RN   CBC     Status: Abnormal   Collection Time: 01/23/17  2:41 AM  Result Value Ref Range   WBC 14.6 (H) 4.0 - 10.5 K/uL   RBC 3.09 (L) 3.87 - 5.11 MIL/uL   Hemoglobin 10.0 (L) 12.0 - 15.0 g/dL   HCT 29.3 (L) 36.0 - 46.0 %   MCV 94.8 78.0 - 100.0 fL   MCH 32.4 26.0 - 34.0 pg   MCHC 34.1 30.0 - 36.0 g/dL   RDW 14.9 11.5 - 15.5 %   Platelets 116 (L) 150 - 400 K/uL    Comment: SPECIMEN CHECKED FOR CLOTS  Comprehensive metabolic panel  Status: Abnormal   Collection Time: 01/23/17  2:41 AM  Result Value Ref Range   Sodium 131 (L) 135 - 145 mmol/L   Potassium 4.8 3.5 - 5.1 mmol/L   Chloride 95 (L) 101 - 111 mmol/L   CO2 23 22 - 32 mmol/L   Glucose, Bld 156 (H) 65 - 99 mg/dL   BUN 55 (H) 6 - 20 mg/dL   Creatinine, Ser 5.42 (H) 0.44 - 1.00 mg/dL   Calcium 8.7 (L) 8.9 - 10.3 mg/dL   Total Protein 6.0 (L) 6.5 - 8.1 g/dL   Albumin 2.6 (L) 3.5 - 5.0 g/dL   AST 1,603 (H) 15 - 41 U/L   ALT 2,049 (H) 14 - 54 U/L   Alkaline Phosphatase 217 (H) 38 - 126 U/L   Total Bilirubin 2.9 (H) 0.3 - 1.2 mg/dL   GFR calc non Af Amer 8 (L) >60 mL/min   GFR calc Af Amer 10 (L) >60 mL/min    Comment: (NOTE) The eGFR has been calculated using the CKD EPI equation. This calculation has not been validated in all clinical situations. eGFR's  persistently <60 mL/min signify possible Chronic Kidney Disease.    Anion gap 13 5 - 15  Heparin level (unfractionated)     Status: Abnormal   Collection Time: 01/23/17  2:41 AM  Result Value Ref Range   Heparin Unfractionated <0.10 (L) 0.30 - 0.70 IU/mL    Comment:        IF HEPARIN RESULTS ARE BELOW EXPECTED VALUES, AND PATIENT DOSAGE HAS BEEN CONFIRMED, SUGGEST FOLLOW UP TESTING OF ANTITHROMBIN III LEVELS.   Glucose, capillary     Status: Abnormal   Collection Time: 01/23/17  8:12 AM  Result Value Ref Range   Glucose-Capillary 144 (H) 65 - 99 mg/dL  Glucose, capillary     Status: Abnormal   Collection Time: 01/23/17  1:09 PM  Result Value Ref Range   Glucose-Capillary 153 (H) 65 - 99 mg/dL  Heparin level (unfractionated)     Status: Abnormal   Collection Time: 01/23/17  2:51 PM  Result Value Ref Range   Heparin Unfractionated 1.10 (H) 0.30 - 0.70 IU/mL    Comment:        IF HEPARIN RESULTS ARE BELOW EXPECTED VALUES, AND PATIENT DOSAGE HAS BEEN CONFIRMED, SUGGEST FOLLOW UP TESTING OF ANTITHROMBIN III LEVELS. RESULTS CONFIRMED BY MANUAL DILUTION   Glucose, capillary     Status: Abnormal   Collection Time: 01/23/17  4:18 PM  Result Value Ref Range   Glucose-Capillary 189 (H) 65 - 99 mg/dL  Heparin level (unfractionated)     Status: Abnormal   Collection Time: 01/23/17  6:30 PM  Result Value Ref Range   Heparin Unfractionated <0.10 (L) 0.30 - 0.70 IU/mL    Comment:        IF HEPARIN RESULTS ARE BELOW EXPECTED VALUES, AND PATIENT DOSAGE HAS BEEN CONFIRMED, SUGGEST FOLLOW UP TESTING OF ANTITHROMBIN III LEVELS.   Glucose, capillary     Status: Abnormal   Collection Time: 01/23/17  9:31 PM  Result Value Ref Range   Glucose-Capillary 173 (H) 65 - 99 mg/dL   Comment 1 Notify RN   CBC     Status: Abnormal   Collection Time: 01/24/17  4:19 AM  Result Value Ref Range   WBC 19.2 (H) 4.0 - 10.5 K/uL   RBC 3.28 (L) 3.87 - 5.11 MIL/uL   Hemoglobin 10.6 (L) 12.0 - 15.0  g/dL   HCT 30.9 (L) 36.0 - 46.0 %  MCV 94.2 78.0 - 100.0 fL   MCH 32.3 26.0 - 34.0 pg   MCHC 34.3 30.0 - 36.0 g/dL   RDW 14.9 11.5 - 15.5 %   Platelets 164 150 - 400 K/uL  Heparin level (unfractionated)     Status: None   Collection Time: 01/24/17  4:19 AM  Result Value Ref Range   Heparin Unfractionated 0.46 0.30 - 0.70 IU/mL    Comment:        IF HEPARIN RESULTS ARE BELOW EXPECTED VALUES, AND PATIENT DOSAGE HAS BEEN CONFIRMED, SUGGEST FOLLOW UP TESTING OF ANTITHROMBIN III LEVELS.   Renal function panel     Status: Abnormal   Collection Time: 01/24/17  4:19 AM  Result Value Ref Range   Sodium 130 (L) 135 - 145 mmol/L   Potassium 5.7 (H) 3.5 - 5.1 mmol/L   Chloride 95 (L) 101 - 111 mmol/L   CO2 21 (L) 22 - 32 mmol/L   Glucose, Bld 230 (H) 65 - 99 mg/dL   BUN 71 (H) 6 - 20 mg/dL   Creatinine, Ser 6.45 (H) 0.44 - 1.00 mg/dL   Calcium 8.7 (L) 8.9 - 10.3 mg/dL   Phosphorus 8.2 (H) 2.5 - 4.6 mg/dL   Albumin 2.6 (L) 3.5 - 5.0 g/dL   GFR calc non Af Amer 7 (L) >60 mL/min   GFR calc Af Amer 8 (L) >60 mL/min    Comment: (NOTE) The eGFR has been calculated using the CKD EPI equation. This calculation has not been validated in all clinical situations. eGFR's persistently <60 mL/min signify possible Chronic Kidney Disease.    Anion gap 14 5 - 15    ABGS No results for input(s): PHART, PO2ART, TCO2, HCO3 in the last 72 hours.  Invalid input(s): PCO2 CULTURES Recent Results (from the past 240 hour(s))  Culture, blood (routine x 2)     Status: Abnormal   Collection Time: 01/18/17  3:41 PM  Result Value Ref Range Status   Specimen Description LEFT ANTECUBITAL  Final   Special Requests BOTTLES DRAWN AEROBIC AND ANAEROBIC 6CC EACH  Final   Culture  Setup Time   Final    GRAM NEGATIVE RODS AEROBIC AND ANAEROBIC BOTTLES Gram Stain Report Called to,Read Back By and Verified With: HOWARD,C AT 0725 BY HUFFINES,S ON 01/19/17    Culture (A)  Final    SERRATIA  MARCESCENS SUSCEPTIBILITIES PERFORMED ON PREVIOUS CULTURE WITHIN THE LAST 5 DAYS. Performed at [facility], [place] [address] [place], [place] [address]    Report Status 01/21/2017 FINAL  Final  Culture, blood (routine x 2)     Status: Abnormal   Collection Time: 01/18/17  3:41 PM  Result Value Ref Range Status   Specimen Description BLOOD LEFT HAND  Final   Special Requests BOTTLES DRAWN AEROBIC AND ANAEROBIC [facility] EACH  Final   Culture  Setup Time   Final    GRAM NEGATIVE RODS AEROBIC AND ANAEROBIC BOTTLES Gram Stain Report Called to,Read Back By and Verified With: HOWARD,C AT 1443 BY HUFFINES,S ON 01/19/17. CRITICAL RESULT CALLED TO, READ BACK BY AND VERIFIED WITHJuel Burrow PHARMD 1535 01/19/17 A BROWNING Performed at [facility], [place] [address] [place], [place] [address]    Culture SERRATIA MARCESCENS (A)  Final   Report Status 01/21/2017 FINAL  Final   Organism ID, Bacteria SERRATIA MARCESCENS  Final      Susceptibility   Serratia marcescens - MIC*    CEFAZOLIN >=64 RESISTANT Resistant  CEFEPIME <=1 SENSITIVE Sensitive     CEFTAZIDIME <=1 SENSITIVE Sensitive     CEFTRIAXONE <=1 SENSITIVE Sensitive     CIPROFLOXACIN <=0.25 SENSITIVE Sensitive     GENTAMICIN <=1 SENSITIVE Sensitive     TRIMETH/SULFA <=20 SENSITIVE Sensitive     * SERRATIA MARCESCENS  Blood Culture ID Panel (Reflexed)     Status: Abnormal   Collection Time: 01/18/17  3:41 PM  Result Value Ref Range Status   Enterococcus species NOT DETECTED NOT DETECTED Final   Listeria monocytogenes NOT DETECTED NOT DETECTED Final   Staphylococcus species NOT DETECTED NOT DETECTED Final   Staphylococcus aureus NOT DETECTED NOT DETECTED Final   Streptococcus species NOT DETECTED NOT DETECTED Final   Streptococcus agalactiae NOT DETECTED NOT DETECTED Final   Streptococcus pneumoniae NOT DETECTED NOT DETECTED Final   Streptococcus pyogenes NOT DETECTED NOT DETECTED Final   Acinetobacter baumannii NOT DETECTED NOT  DETECTED Final   Enterobacteriaceae species DETECTED (A) NOT DETECTED Final    Comment: Enterobacteriaceae represent a large family of gram-negative bacteria, not a single organism. CRITICAL RESULT CALLED TO, READ BACK BY AND VERIFIED WITH: S HALL PHARMD 1535 01/19/17 A BROWNING    Enterobacter cloacae complex NOT DETECTED NOT DETECTED Final   Escherichia coli NOT DETECTED NOT DETECTED Final   Klebsiella oxytoca NOT DETECTED NOT DETECTED Final   Klebsiella pneumoniae NOT DETECTED NOT DETECTED Final   Proteus species NOT DETECTED NOT DETECTED Final   Serratia marcescens DETECTED (A) NOT DETECTED Final    Comment: CRITICAL RESULT CALLED TO, READ BACK BY AND VERIFIED WITH: S HALL PHARMD 1535 01/19/17 A BROWNING    Carbapenem resistance NOT DETECTED NOT DETECTED Final   Haemophilus influenzae NOT DETECTED NOT DETECTED Final   Neisseria meningitidis NOT DETECTED NOT DETECTED Final   Pseudomonas aeruginosa NOT DETECTED NOT DETECTED Final   Candida albicans NOT DETECTED NOT DETECTED Final   Candida glabrata NOT DETECTED NOT DETECTED Final   Candida krusei NOT DETECTED NOT DETECTED Final   Candida parapsilosis NOT DETECTED NOT DETECTED Final   Candida tropicalis NOT DETECTED NOT DETECTED Final    Comment: Performed at [facility], [place] [address] [place], [place] [address]  MRSA PCR Screening     Status: None   Collection Time: 01/18/17 10:25 PM  Result Value Ref Range Status   MRSA by PCR NEGATIVE NEGATIVE Final    Comment:        The GeneXpert MRSA Assay (FDA approved for NASAL specimens only), is one component of a comprehensive MRSA colonization surveillance program. It is not intended to diagnose MRSA infection nor to guide or monitor treatment for MRSA infections.    Studies/Results: No results found.  Medications:  Prior to Admission:  Prescriptions Prior to Admission  Medication Sig Dispense Refill Last Dose  . acetaminophen (TYLENOL) 325 MG tablet Take 650 mg by  mouth every 6 (six) hours as needed for moderate pain or fever.    01/17/2017 at Unknown time  . albuterol (PROVENTIL) (2.5 MG/3ML) 0.083% nebulizer solution Take 2.5 mg by nebulization every 6 (six) hours as needed for wheezing or shortness of breath.   Past Month at Unknown time  . ALPRAZolam (XANAX) 0.5 MG tablet Take 0.5 mg by mouth 3 (three) times daily as needed for anxiety or sleep.    01/17/2017 at Unknown time  . amLODipine (NORVASC) 10 MG tablet Take 0.5 tablets (5 mg total) by mouth daily. 15 tablet 12 01/17/2017 at Unknown time  . cinacalcet (  SENSIPAR) 30 MG tablet Take 30 mg by mouth daily.   01/17/2017 at Unknown time  . diphenhydrAMINE (BENADRYL) 25 mg capsule Take 25 mg by mouth every 6 (six) hours as needed for allergies.   unknown  . Insulin Glargine (LANTUS SOLOSTAR) 100 UNIT/ML Solostar Pen Inject 10 Units into the skin daily at 10 pm. 15 mL 11 01/17/2017 at Unknown time  . metoprolol (LOPRESSOR) 50 MG tablet Take 0.5 tablets (25 mg total) by mouth 2 (two) times daily. 30 tablet 12 01/17/2017 at Unknown time  . multivitamin (RENA-VIT) TABS tablet Take 1 tablet by mouth daily.   01/17/2017 at Unknown time  . Oxycodone HCl 10 MG TABS Take 1 tablet (10 mg total) by mouth 3 (three) times daily as needed (pain). (Patient taking differently: Take 10 mg by mouth 4 (four) times daily as needed (pain). ) 15 tablet 0 unknown  . sevelamer carbonate (RENVELA) 800 MG tablet Take 1,600-3,200 mg by mouth See admin instructions. Take 3200 mg by mouth 3 times daily with meals and take 1600 mg by mouth with snacks.   01/17/2017 at Unknown time  . doxycycline (VIBRAMYCIN) 50 MG capsule Take 2 capsules (100 mg total) by mouth 2 (two) times daily. (Patient not taking: Reported on 01/18/2017) 20 capsule 0 Not Taking at Unknown time  . predniSONE (STERAPRED UNI-PAK 21 TAB) 10 MG (21) TBPK tablet Take by package instructions (Patient not taking: Reported on 01/18/2017) 21 tablet 0 Not Taking at Unknown time    Scheduled: . ceFEPime (MAXIPIME) IV  2 g Intravenous Q M,W,F-HD  . Chlorhexidine Gluconate Cloth  6 each Topical Daily  . epoetin (EPOGEN/PROCRIT) injection  8,000 Units Intravenous Q M,W,F-HD  . insulin aspart  0-9 Units Subcutaneous TID WC  . lactulose  30 g Oral BID  . mouth rinse  15 mL Mouth Rinse BID  . predniSONE  50 mg Oral Q6H  . sodium chloride flush  10-40 mL Intracatheter Q12H   Continuous: . heparin 2,700 Units/hr (01/24/17 0600)  . phenylephrine ([name]-SYNEPHRINE) Adult infusion 65 mcg/min (01/24/17 0654)   EOF:HQRFXJ chloride, sodium chloride, sodium chloride, sodium chloride, sodium chloride, sodium chloride, acetaminophen, albuterol, ALPRAZolam, alteplase, alteplase, guaiFENesin-dextromethorphan, heparin, heparin, levalbuterol, lidocaine (PF), lidocaine (PF), lidocaine-prilocaine, ondansetron **OR** ondansetron (ZOFRAN) IV, oxyCODONE, pentafluoroprop-tetrafluoroeth, sodium chloride flush  Assesment: She was admitted with cellulitis of her left leg. She had sepsis with septic shock from that. She is still hypotensive but that's improving. She has required pressors but she's coming down on the pressors now. She has what looks like shock liver with markedly elevated transaminases. Level today is pending. She has been confused and I think that may be related to her liver injury so I'm going to check an ammonia level. She has history of chronic diastolic heart failure and had what appeared to be more right-sided heart failure on echocardiogram so we are pursuing potential pulmonary emboli and pulmonary hypertension. At baseline she has morbid obesity and obesity hypoventilation. Will check blood gas for completeness but she does not have typical symptoms at this point of hypercapnic respiratory failure which she has had in the past. She has end-stage renal disease on dialysis. She has chronic lymphedema of both legs. She's complaining of constipation. She has diabetes and her blood  sugar is fairly well controlled. She has had elevated troponin but that is not felt to indicate acute coronary syndrome. Principal Problem:   Fever Active Problems:   Diabetes mellitus ([place])   Morbid obesity ([place])   Obesity  hypoventilation syndrome (HCC)   Chronic diastolic CHF (congestive heart failure) (HCC)   Chronic respiratory failure with hypoxia (HCC)   ESRD on dialysis ([place])   Flu-like symptoms   Elevated troponin    Plan: Continue current treatments. Check venous Doppler of legs and arms. She will need ventilation/perfusion lung scan. Start Coumadin because I think she's going to need to be chronically anticoagulated check ammonia level. Start lactulose because of her confusion and because of her constipation check arterial blood gas    LOS: 6 days   Ramiz Turpin L 01/24/2017, 7:59 AM

## 2017-01-24 NOTE — Progress Notes (Signed)
Progress Note  Patient Name: HAYDE KILGOUR Date of Encounter: 01/24/2017  Primary Cardiologist: New    Subjective   Lethargic but conversant   Inpatient Medications    Scheduled Meds: . ceFEPime (MAXIPIME) IV  2 g Intravenous Q M,W,F-HD  . Chlorhexidine Gluconate Cloth  6 each Topical Daily  . epoetin (EPOGEN/PROCRIT) injection  8,000 Units Intravenous Q M,W,F-HD  . insulin aspart  0-9 Units Subcutaneous TID WC  . lactulose  30 g Oral BID  . mouth rinse  15 mL Mouth Rinse BID  . sodium chloride flush  10-40 mL Intracatheter Q12H  . warfarin  7.5 mg Oral Once  . Warfarin - Pharmacist Dosing Inpatient   Does not apply q1800   Continuous Infusions: . heparin 2,700 Units/hr (01/24/17 0600)  . phenylephrine ([name]-SYNEPHRINE) Adult infusion 60 mcg/min (01/24/17 0827)   PRN Meds: sodium chloride, sodium chloride, sodium chloride, sodium chloride, sodium chloride, sodium chloride, acetaminophen, albuterol, ALPRAZolam, alteplase, alteplase, guaiFENesin-dextromethorphan, heparin, heparin, levalbuterol, lidocaine (PF), lidocaine (PF), lidocaine-prilocaine, ondansetron **OR** ondansetron (ZOFRAN) IV, oxyCODONE, pentafluoroprop-tetrafluoroeth, sodium chloride flush   Vital Signs    Vitals:   01/24/17 0615 01/24/17 0630 01/24/17 0645 01/24/17 0757  BP: (!) 153/82 (!) 143/85 (!) 146/85   Pulse: 67 68 66 71  Resp: _0 Temp:    97.8 F (36.6 C)  TempSrc:    Oral  SpO2: 99% 99% 100% 99%  Weight:      Height:        Intake/Output Summary (Last 24 hours) at 01/24/17 0921 Last data filed at 01/24/17 0857  Gross per 24 hour  Intake          1836.55 ml  Output              300 ml  Net          1536.55 ml    I/O 203 negative  Filed Weights   01/21/17 0500 01/23/17 0500 01/24/17 0500  Weight: (!) 317 lb 14.5 oz (144.2 kg) (!) 330 lb 14.6 oz (150.1 kg) (!) 334 lb 14.1 oz (151.9 kg)    Telemetry    SR    ECG    None  Physical Exam   GEN: No acute distress.     Neck: JVP difficult to assess   Cardiac: RRR, no murmurs, rubs, or gallops.  Respiratory: Clear to auscultation bilaterally.  MS:  Signif lymphedema L leg  Foot especially  Tender  R leg with 2+ edema  . Catheters in central veins    Labs    Chemistry  Recent Labs Lab 01/22/17 0600 01/23/17 0241 01/24/17 0419  NA 133* 131* 130*  K 4.7 4.8 5.7*  CL 98* 95* 95*  CO2 25 23 21*  GLUCOSE 152* 156* 230*  BUN 42* 55* 71*  CREATININE 4.62* 5.42* 6.45*  CALCIUM 8.2* 8.7* 8.7*  PROT 6.4* 6.0* 6.4*  ALBUMIN 2.9* 2.6* 2.6*  2.6*  AST 2,283* 1,603* 1,105*  ALT 2,202* 2,049* 1,966*  ALKPHOS 183* 217* 244*  BILITOT 3.0* 2.9* 2.8*  GFRNONAA 10* 8* 7*  GFRAA 12* 10* 8*  ANIONGAP _1 Hematology  Recent Labs Lab 01/22/17 0600 01/23/17 0241 01/24/17 0419  WBC 13.7* 14.6* 19.2*  RBC 3.08* 3.09* 3.28*  HGB 10.0* 10.0* 10.6*  HCT 29.4* 29.3* 30.9*  MCV 95.5 94.8 94.2  MCH 32.5 32.4 32.3  MCHC 34.0 34.1 34.3  RDW 15.1 14.9 14.9  PLT 92* 116* 164  Cardiac Enzymes  Recent Labs Lab 01/20/17 0813 01/20/17 1333 01/20/17 1944 01/21/17 0800  TROPONINI 0.21* 0.97* 3.57* 5.31*   No results for input(s): TROPIPOC in the last 168 hours.   BNPNo results for input(s): BNP, PROBNP in the last 168 hours.   DDimer   Recent Labs Lab 01/24/17 0419  DDIMER 7.87*     Radiology    No results found.  Cardiac Studies   Echo  EF 65-70% severe pulmonary HTN PA 85 mmHg  Patient Profile     51 y.o. female ESRD on hemodialysis, chronic lymphedema, cellulitis, OSA, chronic hypoxia  Admitted woth L leg pain and fever  Dx cellulitis and sepsis.    Assessment & Plan    1  Elevated troponin  I am not convinced of acitve ischemia  My represent strain in setting of sepsis.  Echo with normal LV function  No indication for left heart cath   2  Sepsis  ON ABX    3  ESRD  On dialysis    4  Chronic diastolic XCHF  Volume managed with dialysis   5. Pulmonary HTN:   Concern for PE.  Discussed with Dr [name] Get UE duplex To make sure no thrombus on central catheters. V/Q scan when more stable since Allergic to dye and more sensitive test. Empirically start DOAC  Check D dimer  .   Signed, Jenkins Rouge, MD  01/24/2017, 9:21 AM

## 2017-01-25 ENCOUNTER — Inpatient Hospital Stay (HOSPITAL_COMMUNITY): Payer: Medicare Other

## 2017-01-25 ENCOUNTER — Encounter (HOSPITAL_COMMUNITY): Payer: Self-pay

## 2017-01-25 LAB — HEPARIN LEVEL (UNFRACTIONATED): HEPARIN UNFRACTIONATED: 0.65 [IU]/mL (ref 0.30–0.70)

## 2017-01-25 LAB — COMPREHENSIVE METABOLIC PANEL
ALK PHOS: 248 U/L — AB (ref 38–126)
ALT: 1342 U/L — AB (ref 14–54)
AST: 316 U/L — AB (ref 15–41)
Albumin: 2.5 g/dL — ABNORMAL LOW (ref 3.5–5.0)
Anion gap: 14 (ref 5–15)
BUN: 56 mg/dL — AB (ref 6–20)
CALCIUM: 9 mg/dL (ref 8.9–10.3)
CHLORIDE: 93 mmol/L — AB (ref 101–111)
CO2: 25 mmol/L (ref 22–32)
Creatinine, Ser: 4.8 mg/dL — ABNORMAL HIGH (ref 0.44–1.00)
GFR calc non Af Amer: 10 mL/min — ABNORMAL LOW (ref >60)
GFR, EST AFRICAN AMERICAN: 11 mL/min — AB (ref >60)
GLUCOSE: 220 mg/dL — AB (ref 65–99)
Potassium: 4.6 mmol/L (ref 3.5–5.1)
SODIUM: 132 mmol/L — AB (ref 135–145)
Total Bilirubin: 2 mg/dL — ABNORMAL HIGH (ref 0.3–1.2)
Total Protein: 6.3 g/dL — ABNORMAL LOW (ref 6.5–8.1)

## 2017-01-25 LAB — CBC
HEMATOCRIT: 29.8 % — AB (ref 36.0–46.0)
HEMOGLOBIN: 10.4 g/dL — AB (ref 12.0–15.0)
MCH: 32.5 pg (ref 26.0–34.0)
MCHC: 34.9 g/dL (ref 30.0–36.0)
MCV: 93.1 fL (ref 78.0–100.0)
Platelets: 213 10*3/uL (ref 150–400)
RBC: 3.2 MIL/uL — AB (ref 3.87–5.11)
RDW: 14.6 % (ref 11.5–15.5)
WBC: 19.6 10*3/uL — ABNORMAL HIGH (ref 4.0–10.5)

## 2017-01-25 LAB — GLUCOSE, CAPILLARY
GLUCOSE-CAPILLARY: 214 mg/dL — AB (ref 65–99)
GLUCOSE-CAPILLARY: 247 mg/dL — AB (ref 65–99)
GLUCOSE-CAPILLARY: 272 mg/dL — AB (ref 65–99)
Glucose-Capillary: 268 mg/dL — ABNORMAL HIGH (ref 65–99)

## 2017-01-25 LAB — PROTIME-INR
INR: 1.49
Prothrombin Time: 18.2 seconds — ABNORMAL HIGH (ref 11.4–15.2)

## 2017-01-25 MED ORDER — TECHNETIUM TC 99M DIETHYLENETRIAME-PENTAACETIC ACID
30.0000 | Freq: Once | INTRAVENOUS | Status: AC | PRN
  Administered 2017-01-25: 30 via INTRAVENOUS

## 2017-01-25 MED ORDER — WARFARIN SODIUM 5 MG PO TABS
5.0000 mg | ORAL_TABLET | Freq: Once | ORAL | Status: AC
  Administered 2017-01-25: 5 mg via ORAL
  Filled 2017-01-25: qty 1

## 2017-01-25 MED ORDER — TECHNETIUM TC 99M TETROFOSMIN IV KIT: 10.0000 | PACK | Freq: Once | INTRAVENOUS | Status: DC | PRN

## 2017-01-25 MED ORDER — TECHNETIUM TO 99M ALBUMIN AGGREGATED
3.0000 | Freq: Once | INTRAVENOUS | Status: AC | PRN
  Administered 2017-01-25: 3.8 via INTRAVENOUS

## 2017-01-25 MED ORDER — TECHNETIUM TC 99M TETROFOSMIN IV KIT: 30.0000 | PACK | Freq: Once | INTRAVENOUS | Status: DC | PRN

## 2017-01-25 MED ORDER — LACTULOSE 10 GM/15ML PO SOLN
30.0000 g | Freq: Three times a day (TID) | ORAL | Status: DC
  Administered 2017-01-25 – 2017-01-26 (×5): 30 g via ORAL
  Filled 2017-01-25 (×5): qty 60

## 2017-01-25 NOTE — Care Management (Signed)
Patient Information   Patient Name [name], [name] ([id number]) Sex Female DOB [birth year]  Room Bed  IC11 IC11-01  Patient Demographics   Address [address] [place] [address] Phone [phone] [facility]) [phone] (Mobile) E-mail Address [email or web address]  Patient Ethnicity & Race   Ethnic Group Patient Race  Not Hispanic or Latino Black or African American  Emergency Contact(s)   Name Relation Home Work Lyons Spouse [phone]  [phone]  Amalia Hailey [phone]    Nicholson,Bishop Father [phone]    Constance Haw [phone]  [phone]  Documents on File    Status Date Received Description  Documents for the Patient  EMR Patient Summary Not Received    Driver's License Not Received  exp 2015  Historic Radiology Documentation Not Received    Historic Radiology Documentation Not Received    [place] Received 10/26/11   [place] E-Signature HIPAA Notice of Privacy Received 10/04/11   [place] E-Signature HIPAA Notice of Privacy [place] Not Received    Insurance Card Received 02/18/16 mcare 2017  Advance Directives/Living Will/HCPOA/POA Not Received    Financial Application Not Received    Insurance Card Not Received    Insurance Card Not Received    Conversion ED [place] Not Received    Conversion ED [place] Not Received    Release of Information Not Received    HIM ROI Authorization Not Received    Release of Information Not Received    Release of Information Not Received    Insurance Card Received 05/03/13   Insurance Card Not Received    Insurance Card Received 05/30/13 MEDICAID  Insurance Card Not Received    [place] HIPAA NOTICE OF PRIVACY - Scanned Received 05/30/13   [place] HIPAA NOTICE OF PRIVACY - Scanned Not Received    AMB Intake Forms/Questionnaires Not Received    HIM ROI Authorization  03/26/14   Other Photo ID Not Received     VVS Policy for Pain - E Signature     Insurance Card Received 03/12/16 medicare/kf/vvs  AMB Correspondence  03/19/16 REFERRAL BEFEKADU MD,B  [place] E-Signature HIPAA Notice of Privacy Received 04/07/16   AMB Intake Forms/Questionnaires (Deleted) 08/08/13   Documents for the Encounter  AOB (Assignment of Insurance Benefits) Not Received    E-signature AOB Signed 01/18/17   MEDICARE RIGHTS Not Received    E-signature Medicare Rights Signed 01/18/17   Cardiac Monitoring Strip  01/19/17   Cardiac Monitoring Strip Shift Summary  01/19/17   ED Patient Billing Extract   ED PB Summary  ED Patient Billing Extract   ED Encounter Summary  Ultrasound  01/25/17   Ultrasound  01/25/17   Ultrasound  01/25/17   EKG (Deleted) 01/19/17   EKG  01/19/17   Admission Information   Attending Provider Admitting Provider Admission Type Admission Date/Time  [name], MD Phillips Grout, MD Emergency 01/18/17 1524  Discharge Date Hospital Service Auth/Cert Status Service Area   Internal Medicine Incomplete [place]  Unit Room/Bed Admission Status   AP-ICCUP NURSING IC11/IC11-01 Admission (Confirmed)   Admission   Complaint  [facility] Account   Name Acct ID Class Status Primary Coverage  [place], [place] [id number] Inpatient Open MEDICARE - MEDICARE PART A      Guarantor Account (for [place] [id number])   Name Relation to Pt Service Area Active? Acct Type  [name] Self [facility] Yes Personal/Family  Address Phone  [place], [place] [address] [phone])        Coverage Information (for Hospital Account [id number])   F/O Payor/Plan Precert #  MEDICARE/MEDICARE PART A   Subscriber Subscriber #  [name]

## 2017-01-25 NOTE — Progress Notes (Signed)
PT Cancellation Note  Patient Details Name: [name] MRN: [id number] DOB: [birth year]   Cancelled Treatment:    Reason Eval/Treat Not Completed: Patient at procedure or test/unavailable (Currently having US performed bedside.  Will check back on pt as schedule allows. )   Beth Elisa Sorlie, PT, DPT X: [phone]

## 2017-01-25 NOTE — Progress Notes (Signed)
[facility] for heparin and coumadin Indication: cor pulmonale, r/o PE  Allergies  Allergen Reactions  . Contrast Media [Iodinated Diagnostic Agents] Anaphylaxis, Hives, Swelling and Other (See Comments)    Dye for cardiac cath. Tongue swells  . Pneumococcal Vaccines Swelling    Turns skin black, and bodily swelling  . Shellfish Allergy Itching, Swelling and Other (See Comments)    Facial swelling - Pt able to eat now 11/25/16  . Vancomycin Nausea And Vomiting and Other (See Comments)    Infusion "made me feel like I was dying" had to be readmitted to hospital   Patient Measurements: Height: _0  (165.1 cm) Weight: (!) 334 lb 14.1 oz (151.9 kg) IBW/kg (Calculated) : 57 Heparin Dosing Weight: 93 kg  Vital Signs: Temp: 97.7 F (36.5 C) (02/06 0950) Temp Source: Oral (02/06 0400) BP: 129/82 (02/06 1015) Pulse Rate: 72 (02/06 1015)  Labs:  Recent Labs  01/23/17 0241  01/23/17 1830 01/24/17 0419 01/25/17 0449  HGB 10.0*  --   --  10.6* 10.4*  HCT 29.3*  --   --  30.9* 29.8*  PLT 116*  --   --  164 213  LABPROT  --   --   --   --  18.2*  INR  --   --   --   --  1.49  HEPARINUNFRC <0.10*  < > <0.10* 0.46 0.65  CREATININE 5.42*  --   --  6.45* 4.80*  < > = values in this interval not displayed.  Estimated Creatinine Clearance: 21 mL/min (by C-G formula based on SCr of 4.8 mg/dL (H)).  Medical History: Past Medical History:  Diagnosis Date  . Anxiety   . Asthma    as a child  . Cancer ([place])    thyroid  . Cellulitis   . CHF (congestive heart failure) ([place])   . Chiari malformation    s/p surgery  . Chiari malformation   . Chronic pain   . Complication of anesthesia 11/28/15   Resp arrest after  conscious  sedation  . Constipation   . Coronary artery disease    40-50% mid LAD 04/29/09, Medical tx. (Dr. Gwenlyn Found)  . Diabetes mellitus    Type II  . Fibromyalgia   . History of blood transfusion    hemorrage duinrg pregancy  .  Hx of echocardiogram 10/2011   EF 55-60%  . Hypercholesterolemia   . Hypertension   . Lymph edema   . Obesity hypoventilation syndrome ([place])   . On home O2   . Pneumonia    in past  . Renal disorder    Pt started dialysis in Dec.2016  . S/P colonoscopy 05/26/2007   Dr. Laural Golden sigmoid diverticulosis random biopsies benign  . S/P endoscopy 05/01/2009   Dr. Penelope Coop pill-induced esophageal ulcerations distal to midesophagus, 2 small ulcers in the antrum of the stomach  . Shortness of breath dyspnea    with any exertion or if heart rate  is irregular while on dialysis  . Sleep apnea    Medications:  Prescriptions Prior to Admission  Medication Sig Dispense Refill Last Dose  . acetaminophen (TYLENOL) 325 MG tablet Take 650 mg by mouth every 6 (six) hours as needed for moderate pain or fever.    01/17/2017 at Unknown time  . albuterol (PROVENTIL) (2.5 MG/3ML) 0.083% nebulizer solution Take 2.5 mg by nebulization every 6 (six) hours as needed for wheezing or shortness of breath.   Past Month  at Unknown time  . ALPRAZolam (XANAX) 0.5 MG tablet Take 0.5 mg by mouth 3 (three) times daily as needed for anxiety or sleep.    01/17/2017 at Unknown time  . amLODipine (NORVASC) 10 MG tablet Take 0.5 tablets (5 mg total) by mouth daily. 15 tablet 12 01/17/2017 at Unknown time  . cinacalcet (SENSIPAR) 30 MG tablet Take 30 mg by mouth daily.   01/17/2017 at Unknown time  . diphenhydrAMINE (BENADRYL) 25 mg capsule Take 25 mg by mouth every 6 (six) hours as needed for allergies.   unknown  . Insulin Glargine (LANTUS SOLOSTAR) 100 UNIT/ML Solostar Pen Inject 10 Units into the skin daily at 10 pm. 15 mL 11 01/17/2017 at Unknown time  . metoprolol (LOPRESSOR) 50 MG tablet Take 0.5 tablets (25 mg total) by mouth 2 (two) times daily. 30 tablet 12 01/17/2017 at Unknown time  . multivitamin (RENA-VIT) TABS tablet Take 1 tablet by mouth daily.   01/17/2017 at Unknown time  . Oxycodone HCl 10 MG TABS Take 1 tablet (10 mg  total) by mouth 3 (three) times daily as needed (pain). (Patient taking differently: Take 10 mg by mouth 4 (four) times daily as needed (pain). ) 15 tablet 0 unknown  . sevelamer carbonate (RENVELA) 800 MG tablet Take 1,600-3,200 mg by mouth See admin instructions. Take 3200 mg by mouth 3 times daily with meals and take 1600 mg by mouth with snacks.   01/17/2017 at Unknown time  . doxycycline (VIBRAMYCIN) 50 MG capsule Take 2 capsules (100 mg total) by mouth 2 (two) times daily. (Patient not taking: Reported on 01/18/2017) 20 capsule 0 Not Taking at Unknown time  . predniSONE (STERAPRED UNI-PAK 21 TAB) 10 MG (21) TBPK tablet Take by package instructions (Patient not taking: Reported on 01/18/2017) 21 tablet 0 Not Taking at Unknown time   Assessment: 51 yo lady to start heparin for cor pulmonale, r/o PE. Her Heparin level this am is therapeutic.  MD is ordering ventilation/perfusion lung scan (pending). Also, asked to start Coumadin today. INR today is 1.49 (trending up)  Goal of Therapy:  Heparin level 0.3-0.7 units/ml Monitor platelets by anticoagulation protocol: Yes  INR 2-3   Plan:  Coumadin 69m today x 1 Heparin infusion at 2600 units/hr Daily HL and CBC Monitor for bleeding complications  Thanks for allowing pharmacy to be a part of this patient's care.  SHart Robinsons PharmD Clinical Pharmacist Pager:  [phone]/05/2017   01/25/2017,11:00 AM

## 2017-01-25 NOTE — Progress Notes (Signed)
Subjective: Interval History: Patient still complains of nausea and vomiting mainly early in the morning. She is feeling slightly better. No cough.  Objective: Vital signs in last 24 hours: Temp:  [97.4 F (36.3 C)-98 F (36.7 C)] 98 F (36.7 C) (02/06 0400) Pulse Rate:  [60-81] 70 (02/06 0700) Resp:  [13-25] 16 (02/06 0700) BP: (87-151)/(57-105) 106/64 (02/06 0700) SpO2:  [94 %-100 %] 97 % (02/06 0700) Weight:  [151.9 kg (334 lb 14.1 oz)] 151.9 kg (334 lb 14.1 oz) (02/06 0500) Weight change: 0 kg (0 lb)  Intake/Output from previous day: 02/05 0701 - 02/06 0700 In: 130 [P.O.:120; I.V.:10] Out: 2500  Intake/Output this shift: No intake/output data recorded.  General appearance: alert, cooperative and no distress Resp: diminished breath sounds anterior - bilateral and wheezes bilaterally Cardio: regular rate and rhythm Extremities: edema Patient with venous stasis and chronic leg edema  Lab Results:  Recent Labs  01/24/17 0419 01/25/17 0449  WBC 19.2* 19.6*  HGB 10.6* 10.4*  HCT 30.9* 29.8*  PLT 164 213   BMET:   Recent Labs  01/24/17 0419 01/25/17 0449  NA 130* 132*  K 5.7* 4.6  CL 95* 93*  CO2 21* 25  GLUCOSE 230* 220*  BUN 71* 56*  CREATININE 6.45* 4.80*  CALCIUM 8.7* 9.0   No results for input(s): PTH in the last 72 hours. Iron Studies: No results for input(s): IRON, TIBC, TRANSFERRIN, FERRITIN in the last 72 hours.  Studies/Results: No results found.  I have reviewed the patient's current medications.  Assessment/Plan: Problem #1 sepsis: Patient is on antibiotics. She is afebrile but her white blood cell count is high Problem #2 end-stage renal disease:She is status post hemodialysis yesterday. Her potassium is normal..Patient with some nausea.  Problem #3 anemia: Her hemoglobin is with in our target goal. Patient is on Epogen Problem #4 fluid management: Patient denies any difficulty breathing. status post fluid removal yesterday.  Problem #5  metabolic bone disease: Calcium is in range but her phosphorus is high. Presently patient is on no binder but unable to take because of nausea and vomiting. Problem # 6 sleep apnea: Not on CPAP Problem #7 morbid obesity Problem #8 diabetes: Her blood sugar is slightly high today. Problem #9 elevated LFTs: Possibly ischemic. Show slight improvement.  Plan : 1] patient doesn't need dialysis today  2]  We'll make arrangements for patient to get dialysis tomorrow  3]We'll use 2K/2.5 calcium bath 4]We will remove about 0-9/4 L if systolic blood pressure remains above 90 5]We'll check her CBC and renal panel in the morning.    LOS: 7 days   Shaune Westfall S 01/25/2017,8:22 AM

## 2017-01-25 NOTE — Progress Notes (Signed)
Subjective: She still has some confusion but she's doing better. She is almost off pressors. Poor appetite. She still has not had a bowel movement. No chest pain. She complains of some nausea. She is not eating well. Her leg feels much better  Objective: Vital signs in last 24 hours: Temp:  [97.4 F (36.3 C)-98 F (36.7 C)] 98 F (36.7 C) (02/06 0400) Pulse Rate:  [60-81] 69 (02/06 0300) Resp:  [13-25] 16 (02/06 0300) BP: (83-157)/(52-105) 98/74 (02/06 0300) SpO2:  [94 %-100 %] 98 % (02/06 0200) Weight:  [151.9 kg (334 lb 14.1 oz)] 151.9 kg (334 lb 14.1 oz) (02/06 0500) Weight change: 0 kg (0 lb) Last BM Date:  (01/17/2017)  Intake/Output from previous day: 02/05 0701 - 02/06 0700 In: 130 [P.O.:120; I.V.:10] Out: 2500   PHYSICAL EXAM General appearance: alert, morbidly obese and Confused Resp: clear to auscultation bilaterally Cardio: regular rate and rhythm, S1, S2 normal, no murmur, click, rub or gallop GI: soft, non-tender; bowel sounds normal; no masses,  no organomegaly Extremities: She has bilateral chronic lymphedema. Her cellulitis is much improved Skin otherwise warm and dry. Mucous membranes are moist.  Lab Results:  Results for orders placed or performed during the hospital encounter of 01/18/17 (from the past 48 hour(s))  Glucose, capillary     Status: Abnormal   Collection Time: 01/23/17  8:12 AM  Result Value Ref Range   Glucose-Capillary 144 (H) 65 - 99 mg/dL  Glucose, capillary     Status: Abnormal   Collection Time: 01/23/17  1:09 PM  Result Value Ref Range   Glucose-Capillary 153 (H) 65 - 99 mg/dL  Heparin level (unfractionated)     Status: Abnormal   Collection Time: 01/23/17  2:51 PM  Result Value Ref Range   Heparin Unfractionated 1.10 (H) 0.30 - 0.70 IU/mL    Comment:        IF HEPARIN RESULTS ARE BELOW EXPECTED VALUES, AND PATIENT DOSAGE HAS BEEN CONFIRMED, SUGGEST FOLLOW UP TESTING OF ANTITHROMBIN III LEVELS. RESULTS CONFIRMED BY MANUAL  DILUTION   Glucose, capillary     Status: Abnormal   Collection Time: 01/23/17  4:18 PM  Result Value Ref Range   Glucose-Capillary 189 (H) 65 - 99 mg/dL  Heparin level (unfractionated)     Status: Abnormal   Collection Time: 01/23/17  6:30 PM  Result Value Ref Range   Heparin Unfractionated <0.10 (L) 0.30 - 0.70 IU/mL    Comment:        IF HEPARIN RESULTS ARE BELOW EXPECTED VALUES, AND PATIENT DOSAGE HAS BEEN CONFIRMED, SUGGEST FOLLOW UP TESTING OF ANTITHROMBIN III LEVELS.   Glucose, capillary     Status: Abnormal   Collection Time: 01/23/17  9:31 PM  Result Value Ref Range   Glucose-Capillary 173 (H) 65 - 99 mg/dL   Comment 1 Notify RN   CBC     Status: Abnormal   Collection Time: 01/24/17  4:19 AM  Result Value Ref Range   WBC 19.2 (H) 4.0 - 10.5 K/uL   RBC 3.28 (L) 3.87 - 5.11 MIL/uL   Hemoglobin 10.6 (L) 12.0 - 15.0 g/dL   HCT 30.9 (L) 36.0 - 46.0 %   MCV 94.2 78.0 - 100.0 fL   MCH 32.3 26.0 - 34.0 pg   MCHC 34.3 30.0 - 36.0 g/dL   RDW 14.9 11.5 - 15.5 %   Platelets 164 150 - 400 K/uL  Heparin level (unfractionated)     Status: None   Collection Time: 01/24/17  4:19 AM  Result Value Ref Range   Heparin Unfractionated 0.46 0.30 - 0.70 IU/mL    Comment:        IF HEPARIN RESULTS ARE BELOW EXPECTED VALUES, AND PATIENT DOSAGE HAS BEEN CONFIRMED, SUGGEST FOLLOW UP TESTING OF ANTITHROMBIN III LEVELS.   Renal function panel     Status: Abnormal   Collection Time: 01/24/17  4:19 AM  Result Value Ref Range   Sodium 130 (L) 135 - 145 mmol/L   Potassium 5.7 (H) 3.5 - 5.1 mmol/L   Chloride 95 (L) 101 - 111 mmol/L   CO2 21 (L) 22 - 32 mmol/L   Glucose, Bld 230 (H) 65 - 99 mg/dL   BUN 71 (H) 6 - 20 mg/dL   Creatinine, Ser 6.45 (H) 0.44 - 1.00 mg/dL   Calcium 8.7 (L) 8.9 - 10.3 mg/dL   Phosphorus 8.2 (H) 2.5 - 4.6 mg/dL   Albumin 2.6 (L) 3.5 - 5.0 g/dL   GFR calc non Af Amer 7 (L) >60 mL/min   GFR calc Af Amer 8 (L) >60 mL/min    Comment: (NOTE) The eGFR has been  calculated using the CKD EPI equation. This calculation has not been validated in all clinical situations. eGFR's persistently <60 mL/min signify possible Chronic Kidney Disease.    Anion gap 14 5 - 15  D-dimer, quantitative (not at [facility])     Status: Abnormal   Collection Time: 01/24/17  4:19 AM  Result Value Ref Range   D-Dimer, Quant 7.87 (H) 0.00 - 0.50 ug/mL-FEU    Comment: (NOTE) At the manufacturer cut-off of 0.50 ug/mL FEU, this assay has been documented to exclude PE with a sensitivity and negative predictive value of 97 to 99%.  At this time, this assay has not been approved by the FDA to exclude DVT/VTE. Results should be correlated with clinical presentation.   Hepatic function panel     Status: Abnormal   Collection Time: 01/24/17  4:19 AM  Result Value Ref Range   Total Protein 6.4 (L) 6.5 - 8.1 g/dL   Albumin 2.6 (L) 3.5 - 5.0 g/dL   AST 1,105 (H) 15 - 41 U/L   ALT 1,966 (H) 14 - 54 U/L   Alkaline Phosphatase 244 (H) 38 - 126 U/L   Total Bilirubin 2.8 (H) 0.3 - 1.2 mg/dL   Bilirubin, Direct 1.6 (H) 0.1 - 0.5 mg/dL   Indirect Bilirubin 1.2 (H) 0.3 - 0.9 mg/dL  Glucose, capillary     Status: Abnormal   Collection Time: 01/24/17  7:56 AM  Result Value Ref Range   Glucose-Capillary 224 (H) 65 - 99 mg/dL   Comment 1 QC Due   Ammonia     Status: Abnormal   Collection Time: 01/24/17  8:02 AM  Result Value Ref Range   Ammonia 40 (H) 9 - 35 umol/L  Glucose, capillary     Status: Abnormal   Collection Time: 01/24/17 11:31 AM  Result Value Ref Range   Glucose-Capillary 236 (H) 65 - 99 mg/dL  Blood gas, arterial     Status: Abnormal   Collection Time: 01/24/17 11:40 AM  Result Value Ref Range   O2 Content 2.0 L/min   Delivery systems NASAL CANNULA    pH, Arterial 7.290 (L) 7.350 - 7.450   pCO2 arterial 41.0 32.0 - 48.0 mmHg   pO2, Arterial 85 83.0 - 108.0 mmHg   Bicarbonate 19.0 (L) 20.0 - 28.0 mmol/L   Acid-base deficit 6.3 (H) 0.0 - 2.0 mmol/L  O2 Saturation  94.3 %   Patient temperature 37.0    Collection site RIGHT RADIAL    Drawn by [phone]    Sample type ARTERIAL DRAW    Allens test (pass/fail) PASS PASS  Glucose, capillary     Status: Abnormal   Collection Time: 01/24/17  4:26 PM  Result Value Ref Range   Glucose-Capillary 196 (H) 65 - 99 mg/dL  Glucose, capillary     Status: Abnormal   Collection Time: 01/24/17  9:37 PM  Result Value Ref Range   Glucose-Capillary 189 (H) 65 - 99 mg/dL  CBC     Status: Abnormal   Collection Time: 01/25/17  4:49 AM  Result Value Ref Range   WBC 19.6 (H) 4.0 - 10.5 K/uL   RBC 3.20 (L) 3.87 - 5.11 MIL/uL   Hemoglobin 10.4 (L) 12.0 - 15.0 g/dL   HCT 29.8 (L) 36.0 - 46.0 %   MCV 93.1 78.0 - 100.0 fL   MCH 32.5 26.0 - 34.0 pg   MCHC 34.9 30.0 - 36.0 g/dL   RDW 14.6 11.5 - 15.5 %   Platelets 213 150 - 400 K/uL  Heparin level (unfractionated)     Status: None   Collection Time: 01/25/17  4:49 AM  Result Value Ref Range   Heparin Unfractionated 0.65 0.30 - 0.70 IU/mL    Comment:        IF HEPARIN RESULTS ARE BELOW EXPECTED VALUES, AND PATIENT DOSAGE HAS BEEN CONFIRMED, SUGGEST FOLLOW UP TESTING OF ANTITHROMBIN III LEVELS.   Comprehensive metabolic panel     Status: Abnormal   Collection Time: 01/25/17  4:49 AM  Result Value Ref Range   Sodium 132 (L) 135 - 145 mmol/L   Potassium 4.6 3.5 - 5.1 mmol/L    Comment: DELTA CHECK NOTED   Chloride 93 (L) 101 - 111 mmol/L   CO2 25 22 - 32 mmol/L   Glucose, Bld 220 (H) 65 - 99 mg/dL   BUN 56 (H) 6 - 20 mg/dL   Creatinine, Ser 4.80 (H) 0.44 - 1.00 mg/dL   Calcium 9.0 8.9 - 10.3 mg/dL   Total Protein 6.3 (L) 6.5 - 8.1 g/dL   Albumin 2.5 (L) 3.5 - 5.0 g/dL   AST 316 (H) 15 - 41 U/L   ALT 1,342 (H) 14 - 54 U/L   Alkaline Phosphatase 248 (H) 38 - 126 U/L   Total Bilirubin 2.0 (H) 0.3 - 1.2 mg/dL   GFR calc non Af Amer 10 (L) >60 mL/min   GFR calc Af Amer 11 (L) >60 mL/min    Comment: (NOTE) The eGFR has been calculated using the CKD EPI  equation. This calculation has not been validated in all clinical situations. eGFR's persistently <60 mL/min signify possible Chronic Kidney Disease.    Anion gap 14 5 - 15  Protime-INR     Status: Abnormal   Collection Time: 01/25/17  4:49 AM  Result Value Ref Range   Prothrombin Time 18.2 (H) 11.4 - 15.2 seconds   INR 1.49     ABGS  Recent Labs  01/24/17 1140  PHART 7.290*  PO2ART 85  HCO3 19.0*   CULTURES Recent Results (from the past 240 hour(s))  Culture, blood (routine x 2)     Status: Abnormal   Collection Time: 01/18/17  3:41 PM  Result Value Ref Range Status   Specimen Description LEFT ANTECUBITAL  Final   Special Requests BOTTLES DRAWN AEROBIC AND ANAEROBIC [place]  Final   Culture  Setup Time   Final    GRAM NEGATIVE RODS AEROBIC AND ANAEROBIC BOTTLES Gram Stain Report Called to,Read Back By and Verified With: HOWARD,C AT 0725 BY HUFFINES,S ON 01/19/17    Culture (A)  Final    SERRATIA MARCESCENS SUSCEPTIBILITIES PERFORMED ON PREVIOUS CULTURE WITHIN THE LAST 5 DAYS. Performed at [facility], [place] [address], [place], [place] [address]    Report Status 01/21/2017 FINAL  Final  Culture, blood (routine x 2)     Status: Abnormal   Collection Time: 01/18/17  3:41 PM  Result Value Ref Range Status   Specimen Description BLOOD LEFT HAND  Final   Special Requests BOTTLES DRAWN AEROBIC AND ANAEROBIC [facility] EACH  Final   Culture  Setup Time   Final    GRAM NEGATIVE RODS AEROBIC AND ANAEROBIC BOTTLES Gram Stain Report Called to,Read Back By and Verified With: HOWARD,C AT 4825 BY HUFFINES,S ON 01/19/17. CRITICAL RESULT CALLED TO, READ BACK BY AND VERIFIED WITHJuel Burrow PHARMD 1535 01/19/17 A BROWNING Performed at [facility], [place] [address] [place], [place] [address]    Culture SERRATIA MARCESCENS (A)  Final   Report Status 01/21/2017 FINAL  Final   Organism ID, Bacteria SERRATIA MARCESCENS  Final      Susceptibility   Serratia marcescens - MIC*     CEFAZOLIN >=64 RESISTANT Resistant     CEFEPIME <=1 SENSITIVE Sensitive     CEFTAZIDIME <=1 SENSITIVE Sensitive     CEFTRIAXONE <=1 SENSITIVE Sensitive     CIPROFLOXACIN <=0.25 SENSITIVE Sensitive     GENTAMICIN <=1 SENSITIVE Sensitive     TRIMETH/SULFA <=20 SENSITIVE Sensitive     * SERRATIA MARCESCENS  Blood Culture ID Panel (Reflexed)     Status: Abnormal   Collection Time: 01/18/17  3:41 PM  Result Value Ref Range Status   Enterococcus species NOT DETECTED NOT DETECTED Final   Listeria monocytogenes NOT DETECTED NOT DETECTED Final   Staphylococcus species NOT DETECTED NOT DETECTED Final   Staphylococcus aureus NOT DETECTED NOT DETECTED Final   Streptococcus species NOT DETECTED NOT DETECTED Final   Streptococcus agalactiae NOT DETECTED NOT DETECTED Final   Streptococcus pneumoniae NOT DETECTED NOT DETECTED Final   Streptococcus pyogenes NOT DETECTED NOT DETECTED Final   Acinetobacter baumannii NOT DETECTED NOT DETECTED Final   Enterobacteriaceae species DETECTED (A) NOT DETECTED Final    Comment: Enterobacteriaceae represent a large family of gram-negative bacteria, not a single organism. CRITICAL RESULT CALLED TO, READ BACK BY AND VERIFIED WITH: S HALL PHARMD 1535 01/19/17 A BROWNING    Enterobacter cloacae complex NOT DETECTED NOT DETECTED Final   Escherichia coli NOT DETECTED NOT DETECTED Final   Klebsiella oxytoca NOT DETECTED NOT DETECTED Final   Klebsiella pneumoniae NOT DETECTED NOT DETECTED Final   Proteus species NOT DETECTED NOT DETECTED Final   Serratia marcescens DETECTED (A) NOT DETECTED Final    Comment: CRITICAL RESULT CALLED TO, READ BACK BY AND VERIFIED WITH: S HALL PHARMD 1535 01/19/17 A BROWNING    Carbapenem resistance NOT DETECTED NOT DETECTED Final   Haemophilus influenzae NOT DETECTED NOT DETECTED Final   Neisseria meningitidis NOT DETECTED NOT DETECTED Final   Pseudomonas aeruginosa NOT DETECTED NOT DETECTED Final   Candida albicans NOT DETECTED NOT  DETECTED Final   Candida glabrata NOT DETECTED NOT DETECTED Final   Candida krusei NOT DETECTED NOT DETECTED Final   Candida parapsilosis NOT DETECTED NOT DETECTED Final   Candida tropicalis NOT DETECTED NOT DETECTED Final  Comment: Performed at [facility], [place] [address] [place], [place] [address]  MRSA PCR Screening     Status: None   Collection Time: 01/18/17 10:25 PM  Result Value Ref Range Status   MRSA by PCR NEGATIVE NEGATIVE Final    Comment:        The GeneXpert MRSA Assay (FDA approved for NASAL specimens only), is one component of a comprehensive MRSA colonization surveillance program. It is not intended to diagnose MRSA infection nor to guide or monitor treatment for MRSA infections.    Studies/Results: No results found.  Medications:  Prior to Admission:  Prescriptions Prior to Admission  Medication Sig Dispense Refill Last Dose  . acetaminophen (TYLENOL) 325 MG tablet Take 650 mg by mouth every 6 (six) hours as needed for moderate pain or fever.    01/17/2017 at Unknown time  . albuterol (PROVENTIL) (2.5 MG/3ML) 0.083% nebulizer solution Take 2.5 mg by nebulization every 6 (six) hours as needed for wheezing or shortness of breath.   Past Month at Unknown time  . ALPRAZolam (XANAX) 0.5 MG tablet Take 0.5 mg by mouth 3 (three) times daily as needed for anxiety or sleep.    01/17/2017 at Unknown time  . amLODipine (NORVASC) 10 MG tablet Take 0.5 tablets (5 mg total) by mouth daily. 15 tablet 12 01/17/2017 at Unknown time  . cinacalcet (SENSIPAR) 30 MG tablet Take 30 mg by mouth daily.   01/17/2017 at Unknown time  . diphenhydrAMINE (BENADRYL) 25 mg capsule Take 25 mg by mouth every 6 (six) hours as needed for allergies.   unknown  . Insulin Glargine (LANTUS SOLOSTAR) 100 UNIT/ML Solostar Pen Inject 10 Units into the skin daily at 10 pm. 15 mL 11 01/17/2017 at Unknown time  . metoprolol (LOPRESSOR) 50 MG tablet Take 0.5 tablets (25 mg total) by mouth 2 (two) times  daily. 30 tablet 12 01/17/2017 at Unknown time  . multivitamin (RENA-VIT) TABS tablet Take 1 tablet by mouth daily.   01/17/2017 at Unknown time  . Oxycodone HCl 10 MG TABS Take 1 tablet (10 mg total) by mouth 3 (three) times daily as needed (pain). (Patient taking differently: Take 10 mg by mouth 4 (four) times daily as needed (pain). ) 15 tablet 0 unknown  . sevelamer carbonate (RENVELA) 800 MG tablet Take 1,600-3,200 mg by mouth See admin instructions. Take 3200 mg by mouth 3 times daily with meals and take 1600 mg by mouth with snacks.   01/17/2017 at Unknown time  . doxycycline (VIBRAMYCIN) 50 MG capsule Take 2 capsules (100 mg total) by mouth 2 (two) times daily. (Patient not taking: Reported on 01/18/2017) 20 capsule 0 Not Taking at Unknown time  . predniSONE (STERAPRED UNI-PAK 21 TAB) 10 MG (21) TBPK tablet Take by package instructions (Patient not taking: Reported on 01/18/2017) 21 tablet 0 Not Taking at Unknown time   Scheduled: . ceFEPime (MAXIPIME) IV  2 g Intravenous Q M,W,F-HD  . Chlorhexidine Gluconate Cloth  6 each Topical Daily  . epoetin (EPOGEN/PROCRIT) injection  8,000 Units Intravenous Q M,W,F-HD  . insulin aspart  0-9 Units Subcutaneous TID WC  . lactulose  30 g Oral BID  . mouth rinse  15 mL Mouth Rinse BID  . sodium chloride flush  10-40 mL Intracatheter Q12H  . Warfarin - Pharmacist Dosing Inpatient   Does not apply q1800   Continuous: . heparin 2,700 Units/hr (01/24/17 1206)  . phenylephrine ([name]-SYNEPHRINE) Adult infusion 45 mcg/min (01/24/17 1444)   AST:MHDQQI chloride,  sodium chloride, sodium chloride, sodium chloride, sodium chloride, sodium chloride, acetaminophen, albuterol, ALPRAZolam, alteplase, alteplase, guaiFENesin-dextromethorphan, heparin, heparin, levalbuterol, lidocaine (PF), lidocaine (PF), lidocaine-prilocaine, ondansetron **OR** ondansetron (ZOFRAN) IV, oxyCODONE, pentafluoroprop-tetrafluoroeth, sodium chloride flush  Assesment: She was admitted with  cellulitis and she was septic. She had septic shock. She has developed shock liver which is improving. At baseline she has diabetes and that's been pretty well controlled and she's not eating much. She has end-stage renal disease on dialysis and dialysis is ongoing. She has chronic diastolic heart failure managed mostly through ultrafiltration with her dialysis. She has acute on chronic hypoxic respiratory failure. She has had episodes of hypercapnic respiratory failure but does not appear to have that on this admission. She has chronic lymphedema which is stable. She has obesity hypoventilation syndrome and morbid obesity. Her ammonia level was slightly elevated yesterday and that may be the source of some of her confusion but I think it's multifactorial and includes her liver issues, ICU, medications. She is anticoagulated because of cor pulmonale and concerns that she may be having chronic pulmonary emboli. Principal Problem:   Fever Active Problems:   Diabetes mellitus ([facility])   Morbid obesity ([facility])   Obesity hypoventilation syndrome ([facility])   Recurrent cellulitis of lower leg   Chronic diastolic CHF (congestive heart failure) (HCC)   Chronic respiratory failure with hypoxia (H[facility])   ESRD on dialysis (H[facility])   Flu-like symptoms   Elevated troponin   Septic shock (H[facility])   Shock liver   Cor pulmonale (H[facility])    Plan: Venous ultrasound upper and lower. Ventilation/perfusion scan. Okay to get food from home. Increase lactulose because she still not had a bowel movement    LOS: 7 days   Johnathan Heskett L 01/25/2017, 7:18 AM

## 2017-01-25 NOTE — Progress Notes (Signed)
Progress Note  Patient Name: [name] Date of Encounter: 01/25/2017  Primary Cardiologist: New    Subjective   More conversant some nausea had dialysis yesterday   Inpatient Medications    Scheduled Meds: . ceFEPime (MAXIPIME) IV  2 g Intravenous Q M,W,F-HD  . Chlorhexidine Gluconate Cloth  6 each Topical Daily  . epoetin (EPOGEN/PROCRIT) injection  8,000 Units Intravenous Q M,W,F-HD  . insulin aspart  0-9 Units Subcutaneous TID WC  . lactulose  30 g Oral TID  . mouth rinse  15 mL Mouth Rinse BID  . sodium chloride flush  10-40 mL Intracatheter Q12H  . Warfarin - Pharmacist Dosing Inpatient   Does not apply q1800   Continuous Infusions: . heparin 2,700 Units/hr (01/24/17 1206)  . phenylephrine ([name]-SYNEPHRINE) Adult infusion 45 mcg/min (01/24/17 1444)   PRN Meds: sodium chloride, sodium chloride, sodium chloride, sodium chloride, sodium chloride, sodium chloride, acetaminophen, albuterol, ALPRAZolam, alteplase, alteplase, guaiFENesin-dextromethorphan, heparin, heparin, levalbuterol, lidocaine (PF), lidocaine (PF), lidocaine-prilocaine, ondansetron **OR** ondansetron (ZOFRAN) IV, oxyCODONE, pentafluoroprop-tetrafluoroeth, sodium chloride flush   Vital Signs    Vitals:   01/25/17 0400 01/25/17 0500 01/25/17 0600 01/25/17 0700  BP: 108/71 99/65  106/64  Pulse: 65 66 60 70  Resp: _0 Temp: 98 F (36.7 C)     TempSrc: Oral     SpO2: 97% 97% 98% 97%  Weight:  (!) 334 lb 14.1 oz (151.9 kg)    Height:        Intake/Output Summary (Last 24 hours) at 01/25/17 0950 Last data filed at 01/24/17 2156  Gross per 24 hour  Intake               10 ml  Output             2500 ml  Net            -2490 ml    I/O 203 negative  Filed Weights   01/24/17 0500 01/24/17 1545 01/25/17 0500  Weight: (!) 334 lb 14.1 oz (151.9 kg) (!) 334 lb 14.1 oz (151.9 kg) (!) 334 lb 14.1 oz (151.9 kg)    Telemetry    SR    ECG    None  Physical Exam   GEN: No acute  distress. Obese black female   Neck: JVP difficult to assess   Cardiac: RRR, no murmurs, rubs, or gallops.  Respiratory: Clear to auscultation bilaterally.  MS:  Signif lymphedema L leg  Foot especially  Tender  R leg with 2+ edema  . Catheters in central veins    Labs    Chemistry  Recent Labs Lab 01/23/17 0241 01/24/17 0419 01/25/17 0449  NA 131* 130* 132*  K 4.8 5.7* 4.6  CL 95* 95* 93*  CO2 23 21* 25  GLUCOSE 156* 230* 220*  BUN 55* 71* 56*  CREATININE 5.42* 6.45* 4.80*  CALCIUM 8.7* 8.7* 9.0  PROT 6.0* 6.4* 6.3*  ALBUMIN 2.6* 2.6*  2.6* 2.5*  AST 1,603* 1,105* 316*  ALT 2,049* 1,966* 1,342*  ALKPHOS 217* 244* 248*  BILITOT 2.9* 2.8* 2.0*  GFRNONAA 8* 7* 10*  GFRAA 10* 8* 11*  ANIONGAP _1 Hematology  Recent Labs Lab 01/23/17 0241 01/24/17 0419 01/25/17 0449  WBC 14.6* 19.2* 19.6*  RBC 3.09* 3.28* 3.20*  HGB 10.0* 10.6* 10.4*  HCT 29.3* 30.9* 29.8*  MCV 94.8 94.2 93.1  MCH 32.4 32.3 32.5  MCHC 34.1 34.3 34.9  RDW 14.9  14.9 14.6  PLT 116* 164 213    Cardiac Enzymes  Recent Labs Lab 01/20/17 0813 01/20/17 1333 01/20/17 1944 01/21/17 0800  TROPONINI 0.21* 0.97* 3.57* 5.31*   No results for input(s): TROPIPOC in the last 168 hours.   BNPNo results for input(s): BNP, PROBNP in the last 168 hours.   DDimer   Recent Labs Lab 01/24/17 0419  DDIMER 7.87*     Radiology    No results found.  Cardiac Studies   Echo  EF 65-70% severe pulmonary HTN PA 85 mmHg  Patient Profile     51 y.o. female ESRD on hemodialysis, chronic lymphedema, cellulitis, OSA, chronic hypoxia  Admitted woth L leg pain and fever  Dx cellulitis and sepsis.    Assessment & Plan    1  Elevated troponin  I am not convinced of acitve ischemia  My represent strain in setting of sepsis.  Echo with normal LV function  No indication for left heart cath   2  Sepsis  ON ABX  Weaning pressors limits ability to Rx pulmonary HTN with vasodilators  3  ESRD  On  dialysis  Scheduled again for tomorrow   4  Chronic diastolic XCHF  Volume managed with dialysis   5. Pulmonary HTN:  Concern for PE.  Discussed with Dr [name] Get UE duplex To make sure no thrombus on central catheters. V/Q scan  since Allergic to dye and more sensitive test. Empirically start DOAC  D dimer 7.87 elevated   .   Signed, Jenkins Rouge, MD  01/25/2017, 9:50 AM

## 2017-01-25 NOTE — Progress Notes (Signed)
Pharmacy Antibiotic Note  [name] is a 51 y.o. female admitted on 01/18/2017 with sepsis.  Pharmacy has been consulted for cefepime dosing. Blood cx + for Serratia. She has cellulitis.  Plan: Cont cefepime 2gm after each HD session Monitor labs, micro and vitals.   Height: 5' 5" (165.1 cm) Weight: (!) 334 lb 14.1 oz (151.9 kg) IBW/kg (Calculated) : 57  Temp (24hrs), Avg:97.8 F (36.6 C), Min:97.4 F (36.3 C), Max:98 F (36.7 C)   Recent Labs Lab 01/19/17 0940 01/20/17 0810  01/20/17 1541 01/20/17 1944  01/21/17 0800 01/21/17 2250 01/22/17 0600 01/23/17 0241 01/24/17 0419 01/25/17 0449  WBC 11.9*  --   < >  --   --   < > 16.2*  --  13.7* 14.6* 19.2* 19.6*  CREATININE  --   --   --   --   --   < >  --  3.85* 4.62* 5.42* 6.45* 4.80*  LATICACIDVEN 2.9* 6.3*  --  8.2* 6.9*  --  2.8*  --   --   --   --   --   < > = values in this interval not displayed.  Estimated Creatinine Clearance: 21 mL/min (by C-G formula based on SCr of 4.8 mg/dL (H)).    Allergies  Allergen Reactions  . Contrast Media [Iodinated Diagnostic Agents] Anaphylaxis, Hives, Swelling and Other (See Comments)    Dye for cardiac cath. Tongue swells  . Pneumococcal Vaccines Swelling    Turns skin black, and bodily swelling  . Shellfish Allergy Itching, Swelling and Other (See Comments)    Facial swelling - Pt able to eat now 11/25/16  . Vancomycin Nausea And Vomiting and Other (See Comments)    Infusion "made me feel like I was dying" had to be readmitted to hospital   Antimicrobials this admission: Levaquin 1/30 >> 2/4 Cefepime 2/1 >>  Dose adjustments this admission: ESRD dosing  Microbiology results: 1/30 Bcx: Serratia Marcescens x  2 bottles 1/30 MRSA PCR: negative 1/30 influenza: negative  Culture SERRATIA MARCESCENS (A)    Report Status 01/21/2017 FINAL   Organism ID, Bacteria SERRATIA MARCESCENS  Culture & Susceptibility   SERRATIA MARCESCENS   Antibiotic Sensitivity  Microscan Status  CEFAZOLIN Resistant >=64 RESISTANT Final  Method: MIC  CEFEPIME Sensitive <=1 SENSITIVE Final  Method: MIC  CEFTAZIDIME Sensitive <=1 SENSITIVE Final  Method: MIC  CEFTRIAXONE Sensitive <=1 SENSITIVE Final  Method: MIC  CIPROFLOXACIN Sensitive <=0.25 SENSITIVE Final  Method: MIC  GENTAMICIN Sensitive <=1 SENSITIVE Final  Method: MIC  TRIMETH/SULFA Sensitive <=20 SENSITIVE Final  Method: MIC  Comments SERRATIA MARCESCENS (MIC)   SERRATIA MARCESCENS           Thank you for allowing pharmacy to be a part of this patient's care.  Hart Robinsons, PharmD Clinical Pharmacist Pager:  [phone] 01/25/2017   01/25/2017 11:07 AM

## 2017-01-26 LAB — CBC
HEMATOCRIT: 29.5 % — AB (ref 36.0–46.0)
HEMOGLOBIN: 10.3 g/dL — AB (ref 12.0–15.0)
MCH: 32.5 pg (ref 26.0–34.0)
MCHC: 34.9 g/dL (ref 30.0–36.0)
MCV: 93.1 fL (ref 78.0–100.0)
Platelets: 220 10*3/uL (ref 150–400)
RBC: 3.17 MIL/uL — AB (ref 3.87–5.11)
RDW: 14.6 % (ref 11.5–15.5)
WBC: 22.9 10*3/uL — ABNORMAL HIGH (ref 4.0–10.5)

## 2017-01-26 LAB — GLUCOSE, CAPILLARY
GLUCOSE-CAPILLARY: 232 mg/dL — AB (ref 65–99)
GLUCOSE-CAPILLARY: 174 mg/dL — AB (ref 65–99)
GLUCOSE-CAPILLARY: 163 mg/dL — AB (ref 65–99)
Glucose-Capillary: 157 mg/dL — ABNORMAL HIGH (ref 65–99)

## 2017-01-26 LAB — COMPREHENSIVE METABOLIC PANEL
ALK PHOS: 232 U/L — AB (ref 38–126)
ALT: 939 U/L — AB (ref 14–54)
AST: 102 U/L — AB (ref 15–41)
Albumin: 2.5 g/dL — ABNORMAL LOW (ref 3.5–5.0)
Anion gap: 16 — ABNORMAL HIGH (ref 5–15)
BUN: 79 mg/dL — AB (ref 6–20)
CALCIUM: 8.6 mg/dL — AB (ref 8.9–10.3)
CHLORIDE: 91 mmol/L — AB (ref 101–111)
CO2: 23 mmol/L (ref 22–32)
CREATININE: 5.69 mg/dL — AB (ref 0.44–1.00)
GFR calc non Af Amer: 8 mL/min — ABNORMAL LOW (ref >60)
GFR, EST AFRICAN AMERICAN: 9 mL/min — AB (ref >60)
GLUCOSE: 275 mg/dL — AB (ref 65–99)
Potassium: 4.6 mmol/L (ref 3.5–5.1)
SODIUM: 130 mmol/L — AB (ref 135–145)
Total Bilirubin: 1.3 mg/dL — ABNORMAL HIGH (ref 0.3–1.2)
Total Protein: 6.3 g/dL — ABNORMAL LOW (ref 6.5–8.1)

## 2017-01-26 LAB — PROTIME-INR
INR: 1.55
Prothrombin Time: 18.7 seconds — ABNORMAL HIGH (ref 11.4–15.2)

## 2017-01-26 LAB — HEPARIN LEVEL (UNFRACTIONATED)
HEPARIN UNFRACTIONATED: 1.03 [IU]/mL — AB (ref 0.30–0.70)
Heparin Unfractionated: >2.2 IU/mL — ABNORMAL HIGH (ref 0.30–0.70)

## 2017-01-26 MED ORDER — ALTEPLASE 2 MG IJ SOLR
2.0000 mg | Freq: Once | INTRAMUSCULAR | Status: DC | PRN
  Filled 2017-01-26: qty 2

## 2017-01-26 MED ORDER — HEPARIN (PORCINE) IN NACL 100-0.45 UNIT/ML-% IJ SOLN
1400.0000 [IU]/h | INTRAMUSCULAR | Status: DC
  Administered 2017-01-26 (×2): 2300 [IU]/h via INTRAVENOUS
  Administered 2017-01-27: 1700 [IU]/h via INTRAVENOUS
  Administered 2017-01-28: 1400 [IU]/h via INTRAVENOUS
  Filled 2017-01-26 (×4): qty 250

## 2017-01-26 MED ORDER — SODIUM CHLORIDE 0.9 % IV SOLN: 100.0000 mL | INTRAVENOUS | Status: DC | PRN

## 2017-01-26 MED ORDER — WARFARIN SODIUM 5 MG PO TABS
5.0000 mg | ORAL_TABLET | Freq: Once | ORAL | Status: AC
  Administered 2017-01-26: 5 mg via ORAL
  Filled 2017-01-26: qty 1

## 2017-01-26 MED ORDER — HEPARIN SODIUM (PORCINE) 1000 UNIT/ML DIALYSIS
1000.0000 [IU] | INTRAMUSCULAR | Status: DC | PRN
  Filled 2017-01-26: qty 1

## 2017-01-26 NOTE — Care Management Note (Signed)
Case Management Note  Patient Details  Name: [name] MRN: [id number] Date of Birth: 1966/07/27    Expected Discharge Date:       01/31/2017       Expected Discharge Plan:  [place]  In-House Referral:  NA  Discharge planning Services  CM Consult  Post Acute Care Choice:    Choice offered to:     DME Arranged:    DME Agency:     HH Arranged:    HH Agency:     Status of Service:  In process, will continue to follow  If discussed at Long Length of Stay Meetings, dates discussed:    Additional Comments: Patient referred for possible LTAC on 01/25/2017. Referrals sent to [facility]. Both representatives reviewed patient's info. She will not qualify due to insurance. Patient and husband updated on 01/25/2017.  Curren Mohrmann, Chauncey Reading, RN 01/26/2017, 10:14 AM

## 2017-01-26 NOTE — Progress Notes (Signed)
PT Cancellation Note  Patient Details Name: [name] MRN: [id number] DOB: [birth year]   Cancelled Treatment:    Reason Eval/Treat Not Completed: Patient's level of consciousness Rejeana Brock, RN states that she was given medication, and she did not even awaken for her dressing change.  Will check back tomorrow. )   Beth Hadleigh Felber, PT, DPT X: [phone]

## 2017-01-26 NOTE — Progress Notes (Signed)
Progress Note  Patient Name: [name] Date of Encounter: 01/26/2017  Primary Cardiologist: new Dr. Bronson Ing  Subjective   Nauseated but breathing better  Inpatient Medications    Scheduled Meds: . ceFEPime (MAXIPIME) IV  2 g Intravenous Q M,W,F-HD  . Chlorhexidine Gluconate Cloth  6 each Topical Daily  . epoetin (EPOGEN/PROCRIT) injection  8,000 Units Intravenous Q M,W,F-HD  . insulin aspart  0-9 Units Subcutaneous TID WC  . lactulose  30 g Oral TID  . mouth rinse  15 mL Mouth Rinse BID  . sodium chloride flush  10-40 mL Intracatheter Q12H  . Warfarin - Pharmacist Dosing Inpatient   Does not apply q1800   Continuous Infusions: . heparin 2,600 Units/hr (01/25/17 2245)  . phenylephrine ([name]-SYNEPHRINE) Adult infusion 13 mcg/min (01/25/17 1500)   PRN Meds: sodium chloride, sodium chloride, sodium chloride, sodium chloride, sodium chloride, sodium chloride, sodium chloride, sodium chloride, acetaminophen, albuterol, ALPRAZolam, alteplase, alteplase, alteplase, guaiFENesin-dextromethorphan, heparin, heparin, heparin, levalbuterol, lidocaine (PF), lidocaine (PF), lidocaine-prilocaine, ondansetron **OR** ondansetron (ZOFRAN) IV, oxyCODONE, pentafluoroprop-tetrafluoroeth, sodium chloride flush, technetium tetrofosmin, technetium tetrofosmin   Vital Signs    Vitals:   01/26/17 0200 01/26/17 0300 01/26/17 0400 01/26/17 0500  BP: 95/63 97/64    Pulse: 67 65    Resp: 15 13    Temp:   97.6 F (36.4 C)   TempSrc:   Oral   SpO2: 95% 95%    Weight:    (!) 337 lb 8.4 oz (153.1 kg)  Height:        Intake/Output Summary (Last 24 hours) at 01/26/17 0739 Last data filed at 01/25/17 1500  Gross per 24 hour  Intake           1839.6 ml  Output                0 ml  Net           1839.6 ml   Filed Weights   01/24/17 1545 01/25/17 0500 01/26/17 0500  Weight: (!) 334 lb 14.1 oz (151.9 kg) (!) 334 lb 14.1 oz (151.9 kg) (!) 337 lb 8.4 oz (153.1 kg)    Telemetry    NSR -  Personally Reviewed  ECG    EKG 01/20/17 normal sinus rhythm- Personally Reviewed  Physical Exam   GEN: Nauseated dialysis starting.   Neck: No JVD Cardiac: RRR, distant HS no murmurs, rubs, or gallops.  Respiratory: decreased breath sounds but clear GI: Soft, nontender, non-distended  QH:UTMLYYTKPTW lymphedema  Psych: Normal affect   Labs    Chemistry Recent Labs Lab 01/24/17 0419 01/25/17 0449 01/26/17 0430  NA 130* 132* 130*  K 5.7* 4.6 4.6  CL 95* 93* 91*  CO2 21* 25 23  GLUCOSE 230* 220* 275*  BUN 71* 56* 79*  CREATININE 6.45* 4.80* 5.69*  CALCIUM 8.7* 9.0 8.6*  PROT 6.4* 6.3* 6.3*  ALBUMIN 2.6*  2.6* 2.5* 2.5*  AST 1,105* 316* 102*  ALT 1,966* 1,342* 939*  ALKPHOS 244* 248* 232*  BILITOT 2.8* 2.0* 1.3*  GFRNONAA 7* 10* 8*  GFRAA 8* 11* 9*  ANIONGAP 14 14 16*     Hematology Recent Labs Lab 01/24/17 0419 01/25/17 0449 01/26/17 0430  WBC 19.2* 19.6* 22.9*  RBC 3.28* 3.20* 3.17*  HGB 10.6* 10.4* 10.3*  HCT 30.9* 29.8* 29.5*  MCV 94.2 93.1 93.1  MCH 32.3 32.5 32.5  MCHC 34.3 34.9 34.9  RDW 14.9 14.6 14.6  PLT 164 213 220    Cardiac Enzymes Recent Labs  Lab 01/20/17 0813 01/20/17 1333 01/20/17 1944 01/21/17 0800  TROPONINI 0.21* 0.97* 3.57* 5.31*   No results for input(s): TROPIPOC in the last 168 hours.   BNPNo results for input(s): BNP, PROBNP in the last 168 hours.   DDimer  Recent Labs Lab 01/24/17 0419  DDIMER 7.87*     Radiology    Dg Chest 1 View  Result Date: 01/25/2017 CLINICAL DATA:  For comparison with VQ scan EXAM: CHEST 1 VIEW COMPARISON:  01/20/2017 FINDINGS: Cardiomegaly with vascular congestion. Mild interstitial prominence again noted could reflect interstitial edema. No confluent opacities or effusions. No acute bony abnormality. Right internal jugular PICC line remains in place, unchanged. IMPRESSION: Cardiomegaly with vascular congestion and interstitial prominence, likely mild interstitial edema. Electronically  Signed   By: Rolm Baptise M.D.   On: 01/25/2017 12:52   Nm Pulmonary Perf And Vent  Result Date: 01/25/2017 CLINICAL DATA:  Cor pulmonale EXAM: NUCLEAR MEDICINE VENTILATION - PERFUSION LUNG SCAN TECHNIQUE: Ventilation images were obtained in multiple projections using inhaled aerosol Tc-17mDTPA. Perfusion images were obtained in multiple projections after intravenous injection of Tc-989mAA. Patient unable to remove arms from imaging field in order to perform lateral views. RADIOPHARMACEUTICALS:  3.85 mCi Technetium-9923mPA aerosol inhalation and 30 mCi Technetium-76m26m IV COMPARISON:  None Correlation: Chest radiograph 01/25/2017 FINDINGS: Ventilation: Central airway deposition of tracer. Mild diminished ventilation in LEFT lower lobe. Enlargement of cardiac silhouette. No other segmental or subsegmental ventilatory defects. Perfusion: Matching diminished perfusion in LEFT lower lobe. Enlargement of cardiac silhouette. No additional segmental or subsegmental perfusion defects. Chest radiograph: Enlargement of cardiac silhouette with pulmonary vascular congestion and probable interstitial edema IMPRESSION: Matching diminished ventilation and perfusion in the LEFT lower lobe. Enlargement of cardiac silhouette. Very low probability for pulmonary embolism. Electronically Signed   By: MarkLavonia Dana.   On: 01/25/2017 13:04   Us VKoreaous Img Lower Bilateral  Result Date: 01/25/2017 CLINICAL DATA:  Bilateral lower extremity pain and edema. History of chronic lymphedema and varicose veins. Evaluate for DVT. EXAM: BILATERAL LOWER EXTREMITY VENOUS DOPPLER ULTRASOUND TECHNIQUE: Gray-scale sonography with graded compression, as well as color Doppler and duplex ultrasound were performed to evaluate the lower extremity deep venous systems from the level of the common femoral vein and including the common femoral, femoral, profunda femoral, popliteal and calf veins including the posterior tibial, peroneal and  gastrocnemius veins when visible. The superficial great saphenous vein was also interrogated. Spectral Doppler was utilized to evaluate flow at rest and with distal augmentation maneuvers in the common femoral, femoral and popliteal veins. COMPARISON:  None. FINDINGS: Examination is degraded due to patient body habitus and poor sonographic window. RIGHT LOWER EXTREMITY Common Femoral Vein: No evidence of thrombus. Normal compressibility, respiratory phasicity and response to augmentation. Saphenofemoral Junction: No evidence of thrombus. Normal compressibility and flow on color Doppler imaging. Profunda Femoral Vein: No evidence of thrombus. Normal compressibility and flow on color Doppler imaging. Femoral Vein: No evidence of thrombus. Normal compressibility, respiratory phasicity and response to augmentation. Popliteal Vein: No evidence of thrombus. Normal compressibility, respiratory phasicity and response to augmentation. Calf Veins: No evidence of thrombus. Normal compressibility and flow on color Doppler imaging. Superficial Great Saphenous Vein: No evidence of thrombus. Normal compressibility and flow on color Doppler imaging. Venous Reflux:  None. Other Findings:  None. LEFT LOWER EXTREMITY Common Femoral Vein: No evidence of thrombus. Normal compressibility, respiratory phasicity and response to augmentation. Saphenofemoral Junction: No evidence of thrombus. Normal compressibility and flow on  color Doppler imaging. Profunda Femoral Vein: No evidence of thrombus. Normal compressibility and flow on color Doppler imaging. Femoral Vein: No evidence of thrombus. Normal compressibility, respiratory phasicity and response to augmentation. Popliteal Vein: No evidence of thrombus. Normal compressibility, respiratory phasicity and response to augmentation. Calf Veins: No evidence of thrombus. Normal compressibility and flow on color Doppler imaging. Superficial Great Saphenous Vein: No evidence of thrombus. Normal  compressibility and flow on color Doppler imaging. Venous Reflux:  None. Other Findings:  None. IMPRESSION: Body habitus degraded examination without definitive evidence of DVT within either lower extremity. Electronically Signed   By: Sandi Mariscal M.D.   On: 01/25/2017 16:38   US Venous Img Upper Bilat  Result Date: 01/25/2017 CLINICAL DATA:  Cor pulmonale. History of CHF and morbid obesity. History of right jugular vein approach PICC line and dialysis access within the left forearm. Evaluate for DVT. EXAM: BILATERAL UPPER EXTREMITY VENOUS DOPPLER ULTRASOUND TECHNIQUE: Gray-scale sonography with graded compression, as well as color Doppler and duplex ultrasound were performed to evaluate the bilateral upper extremity deep venous systems from the level of the subclavian vein and including the jugular, axillary, basilic, radial, ulnar and upper cephalic vein. Spectral Doppler was utilized to evaluate flow at rest and with distal augmentation maneuvers. COMPARISON:  None. FINDINGS: RIGHT UPPER EXTREMITY Internal Jugular Vein: No evidence of thrombus. Normal compressibility, respiratory phasicity and response to augmentation. Subclavian Vein: No evidence of thrombus. Normal compressibility, respiratory phasicity and response to augmentation. Axillary Vein: No evidence of thrombus. Normal compressibility, respiratory phasicity and response to augmentation. Cephalic Vein: No evidence of thrombus. Normal compressibility, respiratory phasicity and response to augmentation. Basilic Vein: No evidence of thrombus. Normal compressibility, respiratory phasicity and response to augmentation. Brachial Veins: No evidence of thrombus. Normal compressibility, respiratory phasicity and response to augmentation. Radial Veins: No evidence of thrombus. Normal compressibility, respiratory phasicity and response to augmentation. Ulnar Veins: No evidence of thrombus. Normal compressibility, respiratory phasicity and response to  augmentation. Venous Reflux:  None. Other Findings:  None. LEFT UPPER EXTREMITY Internal Jugular Vein: No evidence of thrombus. Normal compressibility, respiratory phasicity and response to augmentation. Subclavian Vein: No evidence of thrombus. Normal compressibility, respiratory phasicity and response to augmentation. Axillary Vein: No evidence of thrombus. Normal compressibility, respiratory phasicity and response to augmentation. Cephalic Vein: No evidence of thrombus. Normal compressibility, respiratory phasicity and response to augmentation. Basilic Vein: No evidence of thrombus. Normal compressibility, respiratory phasicity and response to augmentation. Brachial Veins: No evidence of thrombus. Normal compressibility, respiratory phasicity and response to augmentation. Radial Veins: No evidence of thrombus. Normal compressibility, respiratory phasicity and response to augmentation. Ulnar Veins: No evidence of thrombus. Normal compressibility, respiratory phasicity and response to augmentation. Venous Reflux:  None. Other Findings: The dialysis graft/fistula within the left forearm appears patent where imaged. IMPRESSION: 1. No evidence of DVT within either upper extremity. 2. The dialysis graft/fistula within left forearm appears patent where imaged. Electronically Signed   By: Sandi Mariscal M.D.   On: 01/25/2017 17:02    Cardiac Studies   2-D echo 2/2/18Study Conclusions   - Left ventricle: Septal flattening consistent with RV volume /   pressure overload. The cavity size was normal. Wall thickness was   increased in a pattern of mild LVH. Systolic function was   vigorous. The estimated ejection fraction was in the range of 65%   to 70%. Doppler parameters are consistent with abnormal left   ventricular relaxation (grade 1 diastolic dysfunction). - Right ventricle: The cavity size  was severely dilated. Systolic   function was moderately reduced. - Right atrium: The atrium was moderately  dilated. - Pulmonary arteries: PA peak pressure: 85 mm Hg (S). - Impressions: No significant change from report of June 2017.   Impressions:   - No significant change from report of June 2017.     Patient Profile     51 y.o. female with end-stage renal disease on hemodialysis, chronic lymphedema, cellulitis, OSA, chronic hypoxia admitted with cellulitis and  sepsis  Assessment & Plan   1  Cor pulmonale.  Pt remains in low dose pressors along with dialysis  BP remains borderline  May need to consider pulling off less volume with dialysis.   Agree with anticoagulation with coumadin Long term Rx will need to be considered in [place] with CHF group  2  Hx sepsis   WBC has remained high despite continued ABX  ? Source   3  ESRD  Currently undergoing dialysis  It is unclear to me what best volume is for pt  4  Elev trop.  Prob demand ischemia in setting of sepsis and pulmonary HTN  5  Elevated LFTs  Significantly improved from last week.  Sumner Boast, PA-C  01/26/2017, 7:39 AM    Pt seen and examined  I have amended note above by Gerrianne Scale to reflect my thoughts/findings Pt currently undergoing dialysis  Remains on low dose neo. Neck:  Full  LUngs Rel clear anteriorly  Cardiac exam   RRR  Ext with signif lymphedemai/edema.  Assessment as noted above.  WIll continue to follow  Dorris Carnes

## 2017-01-26 NOTE — Progress Notes (Signed)
[name]  MRN: [id number]  DOB/AGE: Oct 12, 1966 51 y.o.  Primary Care Physician:HAWKINS,EDWARD L, MD  Admit date: 01/18/2017  Chief Complaint:  Chief Complaint  Patient presents with  . Shortness of Breath    S-Pt presented on  01/18/2017 with  Chief Complaint  Patient presents with  . Shortness of Breath  .    Pt offers no new concerns.    meds . ceFEPime (MAXIPIME) IV  2 g Intravenous Q M,W,F-HD  . Chlorhexidine Gluconate Cloth  6 each Topical Daily  . epoetin (EPOGEN/PROCRIT) injection  8,000 Units Intravenous Q M,W,F-HD  . insulin aspart  0-9 Units Subcutaneous TID WC  . lactulose  30 g Oral TID  . mouth rinse  15 mL Mouth Rinse BID  . sodium chloride flush  10-40 mL Intracatheter Q12H  . warfarin  5 mg Oral Once  . Warfarin - Pharmacist Dosing Inpatient   Does not apply q1800     Physical Exam: Vital signs in last 24 hours: Temp:  [97.1 F (36.2 C)-99.1 F (37.3 C)] 97.6 F (36.4 C) (02/07 0845) Pulse Rate:  [64-74] 65 (02/07 0900) Resp:  [13-17] 13 (02/07 0900) BP: (93-138)/(56-105) 105/60 (02/07 0900) SpO2:  [92 %-99 %] 96 % (02/07 0900) Weight:  [337 lb 8.4 oz (153.1 kg)] 337 lb 8.4 oz (153.1 kg) (02/07 0845) Weight change: 2 lb 10.3 oz (1.2 kg) Last BM Date: 01/26/17  Intake/Output from previous day: 02/06 0701 - 02/07 0700 In: 1918 [P.O.:500; I.V.:1418] Out: -  Total I/O In: 514 [I.V.:514] Out: -    Physical Exam: General- pt is awake,alert, following commands. Resp- No acute REsp distress, ronchi present CVS- S1S2 regular in rate and rhythm GIT- BS+, soft, NT, Morbidly obese EXT- Chronic  LE lympheedema, NO Cyanosis, chronic venous stasis changes Access- AVF  Lab Results: CBC    Component Value Date/Time   WBC 22.9 (H) 01/26/2017 0430   RBC 3.17 (L) 01/26/2017 0430   HGB 10.3 (L) 01/26/2017 0430   HCT 29.5 (L) 01/26/2017 0430   PLT 220 01/26/2017 0430   MCV 93.1 01/26/2017 0430   MCH 32.5 01/26/2017 0430   MCHC 34.9  01/26/2017 0430   RDW 14.6 01/26/2017 0430   LYMPHSABS 0.9 01/22/2017 0600   MONOABS 1.1 (H) 01/22/2017 0600   EOSABS 0.0 01/22/2017 0600   BASOSABS 0.0 01/22/2017 0600       BMET  Recent Labs  01/25/17 0449 01/26/17 0430  NA 132* 130*  K 4.6 4.6  CL 93* 91*  CO2 25 23  GLUCOSE 220* 275*  BUN 56* 79*  CREATININE 4.80* 5.69*  CALCIUM 9.0 8.6*     MICRO Recent Results (from the past 240 hour(s))  Culture, blood (routine x 2)     Status: Abnormal   Collection Time: 01/18/17  3:41 PM  Result Value Ref Range Status   Specimen Description LEFT ANTECUBITAL  Final   Special Requests BOTTLES DRAWN AEROBIC AND ANAEROBIC 6CC EACH  Final   Culture  Setup Time   Final    GRAM NEGATIVE RODS AEROBIC AND ANAEROBIC BOTTLES Gram Stain Report Called to,Read Back By and Verified With: HOWARD,C AT 0725 BY HUFFINES,S ON 01/19/17    Culture (A)  Final    SERRATIA MARCESCENS SUSCEPTIBILITIES PERFORMED ON PREVIOUS CULTURE WITHIN THE LAST 5 DAYS. Performed at [facility], [place] [address] [place], [place] [address]    Report Status 01/21/2017 FINAL  Final  Culture, blood (routine x 2)  Status: Abnormal   Collection Time: 01/18/17  3:41 PM  Result Value Ref Range Status   Specimen Description BLOOD LEFT HAND  Final   Special Requests BOTTLES DRAWN AEROBIC AND ANAEROBIC 6CC EACH  Final   Culture  Setup Time   Final    GRAM NEGATIVE RODS AEROBIC AND ANAEROBIC BOTTLES Gram Stain Report Called to,Read Back By and Verified With: HOWARD,C AT 0725 BY HUFFINES,S ON 01/19/17. CRITICAL RESULT CALLED TO, READ BACK BY AND VERIFIED WITHJuel Burrow PHARMD 1535 01/19/17 A BROWNING Performed at [facility], [place] [address], [place], [place] [address]    Culture SERRATIA MARCESCENS (A)  Final   Report Status 01/21/2017 FINAL  Final   Organism ID, Bacteria SERRATIA MARCESCENS  Final      Susceptibility   Serratia marcescens - MIC*    CEFAZOLIN >=64 RESISTANT Resistant     CEFEPIME <=1  SENSITIVE Sensitive     CEFTAZIDIME <=1 SENSITIVE Sensitive     CEFTRIAXONE <=1 SENSITIVE Sensitive     CIPROFLOXACIN <=0.25 SENSITIVE Sensitive     GENTAMICIN <=1 SENSITIVE Sensitive     TRIMETH/SULFA <=20 SENSITIVE Sensitive     * SERRATIA MARCESCENS  Blood Culture ID Panel (Reflexed)     Status: Abnormal   Collection Time: 01/18/17  3:41 PM  Result Value Ref Range Status   Enterococcus species NOT DETECTED NOT DETECTED Final   Listeria monocytogenes NOT DETECTED NOT DETECTED Final   Staphylococcus species NOT DETECTED NOT DETECTED Final   Staphylococcus aureus NOT DETECTED NOT DETECTED Final   Streptococcus species NOT DETECTED NOT DETECTED Final   Streptococcus agalactiae NOT DETECTED NOT DETECTED Final   Streptococcus pneumoniae NOT DETECTED NOT DETECTED Final   Streptococcus pyogenes NOT DETECTED NOT DETECTED Final   Acinetobacter baumannii NOT DETECTED NOT DETECTED Final   Enterobacteriaceae species DETECTED (A) NOT DETECTED Final    Comment: Enterobacteriaceae represent a large family of gram-negative bacteria, not a single organism. CRITICAL RESULT CALLED TO, READ BACK BY AND VERIFIED WITH: S HALL PHARMD 1535 01/19/17 A BROWNING    Enterobacter cloacae complex NOT DETECTED NOT DETECTED Final   Escherichia coli NOT DETECTED NOT DETECTED Final   Klebsiella oxytoca NOT DETECTED NOT DETECTED Final   Klebsiella pneumoniae NOT DETECTED NOT DETECTED Final   Proteus species NOT DETECTED NOT DETECTED Final   Serratia marcescens DETECTED (A) NOT DETECTED Final    Comment: CRITICAL RESULT CALLED TO, READ BACK BY AND VERIFIED WITH: S HALL PHARMD 1535 01/19/17 A BROWNING    Carbapenem resistance NOT DETECTED NOT DETECTED Final   Haemophilus influenzae NOT DETECTED NOT DETECTED Final   Neisseria meningitidis NOT DETECTED NOT DETECTED Final   Pseudomonas aeruginosa NOT DETECTED NOT DETECTED Final   Candida albicans NOT DETECTED NOT DETECTED Final   Candida glabrata NOT DETECTED NOT  DETECTED Final   Candida krusei NOT DETECTED NOT DETECTED Final   Candida parapsilosis NOT DETECTED NOT DETECTED Final   Candida tropicalis NOT DETECTED NOT DETECTED Final    Comment: Performed at [facility], [place] [address] [place], [place] [address]  MRSA PCR Screening     Status: None   Collection Time: 01/18/17 10:25 PM  Result Value Ref Range Status   MRSA by PCR NEGATIVE NEGATIVE Final    Comment:        The GeneXpert MRSA Assay (FDA approved for NASAL specimens only), is one component of a comprehensive MRSA colonization surveillance program. It is not intended to diagnose MRSA  infection nor to guide or monitor treatment for MRSA infections.       Lab Results  Component Value Date   PTH 127 (H) 11/29/2015   CALCIUM 8.6 (L) 01/26/2017   CAION 1.22 11/25/2016   PHOS 8.2 (H) 01/24/2017       Impression: 1)Renal  ESRD on HD                Pt is on Mon/Wed/Friday schedule                will dialyze today  2)CVs- Pt requiring pressors BP now better  3)Anemia IN ESRD the target HGb goal  Is 9--11. hgb at goal Will keep on epo  4)CKD Mineral-Bone Disorder Secondary Hyperparathyroidism  present      On sensipar Phosphorus not at goal.      On binders  5)ID-admitted with Pneumonia, hx of recurrent cellulitis in leg Clinically better PMD following  6)Electrolytes  Normokalemic  Hyponatremic Sec to ESRD  7)Acid base Co2 at goal   8) Liver LFT high- Shock liver   LFT trending dwon     Plan:  Will dialyze today  Addendum Pt seen on HD Pt tolerating HD well.   [place] S 01/26/2017, 10:15 AM

## 2017-01-26 NOTE — Progress Notes (Signed)
Subjective: She was admitted with cellulitis and sepsis. She developed shock liver from that. She has multiple other problems including morbid obesity, obesity hypoventilation, end-stage renal disease on dialysis, chronic lymphedema of the legs and what appears to be right heart failure on echocardiogram. There was concern that she might have blood clots causing her right heart failure but she's had Doppler of both arms and both legs and she had ventilation/perfusion lung scan and none of those demonstrate clots. She has been requiring pressor support and she was started back on pressors last night although at low dose and her blood pressure appears better. She has been confused and I think that's multifactorial.  Objective: Vital signs in last 24 hours: Temp:  [97.1 F (36.2 C)-99.1 F (37.3 C)] 97.6 F (36.4 C) (02/07 0400) Pulse Rate:  [46-74] 65 (02/07 0300) Resp:  [13-22] 13 (02/07 0300) BP: (83-138)/(56-105) 97/64 (02/07 0300) SpO2:  [92 %-99 %] 95 % (02/07 0300) Weight:  [153.1 kg (337 lb 8.4 oz)] 153.1 kg (337 lb 8.4 oz) (02/07 0500) Weight change: 1.2 kg (2 lb 10.3 oz) Last BM Date: 01/26/17  Intake/Output from previous day: 02/06 0701 - 02/07 0700 In: 1839.6 [P.O.:500; I.V.:1339.6] Out: -   PHYSICAL EXAM General appearance: alert, morbidly obese and Confused Resp: clear to auscultation bilaterally Cardio: regular rate and rhythm, S1, S2 normal, no murmur, click, rub or gallop GI: Chronic lymphedema of both legs. She still has mild tenderness of the left calf Extremities: Chronic lymphedema of both legs. She still has mild tenderness of the left calf Skin otherwise warm and dry.  Lab Results:  Results for orders placed or performed during the hospital encounter of 01/18/17 (from the past 48 hour(s))  Glucose, capillary     Status: Abnormal   Collection Time: 01/24/17  7:56 AM  Result Value Ref Range   Glucose-Capillary 224 (H) 65 - 99 mg/dL   Comment 1 QC Due   Ammonia      Status: Abnormal   Collection Time: 01/24/17  8:02 AM  Result Value Ref Range   Ammonia 40 (H) 9 - 35 umol/L  Glucose, capillary     Status: Abnormal   Collection Time: 01/24/17 11:31 AM  Result Value Ref Range   Glucose-Capillary 236 (H) 65 - 99 mg/dL  Blood gas, arterial     Status: Abnormal   Collection Time: 01/24/17 11:40 AM  Result Value Ref Range   O2 Content 2.0 L/min   Delivery systems NASAL CANNULA    pH, Arterial 7.290 (L) 7.350 - 7.450   pCO2 arterial 41.0 32.0 - 48.0 mmHg   pO2, Arterial 85 83.0 - 108.0 mmHg   Bicarbonate 19.0 (L) 20.0 - 28.0 mmol/L   Acid-base deficit 6.3 (H) 0.0 - 2.0 mmol/L   O2 Saturation 94.3 %   Patient temperature 37.0    Collection site RIGHT RADIAL    Drawn by [id number]    Sample type ARTERIAL DRAW    Allens test (pass/fail) PASS PASS  Glucose, capillary     Status: Abnormal   Collection Time: 01/24/17  4:26 PM  Result Value Ref Range   Glucose-Capillary 196 (H) 65 - 99 mg/dL  Glucose, capillary     Status: Abnormal   Collection Time: 01/24/17  9:37 PM  Result Value Ref Range   Glucose-Capillary 189 (H) 65 - 99 mg/dL  CBC     Status: Abnormal   Collection Time: 01/25/17  4:49 AM  Result Value Ref Range   WBC 19.6 (  H) 4.0 - 10.5 K/uL   RBC 3.20 (L) 3.87 - 5.11 MIL/uL   Hemoglobin 10.4 (L) 12.0 - 15.0 g/dL   HCT 29.8 (L) 36.0 - 46.0 %   MCV 93.1 78.0 - 100.0 fL   MCH 32.5 26.0 - 34.0 pg   MCHC 34.9 30.0 - 36.0 g/dL   RDW 14.6 11.5 - 15.5 %   Platelets 213 150 - 400 K/uL  Heparin level (unfractionated)     Status: None   Collection Time: 01/25/17  4:49 AM  Result Value Ref Range   Heparin Unfractionated 0.65 0.30 - 0.70 IU/mL    Comment:        IF HEPARIN RESULTS ARE BELOW EXPECTED VALUES, AND PATIENT DOSAGE HAS BEEN CONFIRMED, SUGGEST FOLLOW UP TESTING OF ANTITHROMBIN III LEVELS.   Comprehensive metabolic panel     Status: Abnormal   Collection Time: 01/25/17  4:49 AM  Result Value Ref Range   Sodium 132 (L) 135 - 145  mmol/L   Potassium 4.6 3.5 - 5.1 mmol/L    Comment: DELTA CHECK NOTED   Chloride 93 (L) 101 - 111 mmol/L   CO2 25 22 - 32 mmol/L   Glucose, Bld 220 (H) 65 - 99 mg/dL   BUN 56 (H) 6 - 20 mg/dL   Creatinine, Ser 4.80 (H) 0.44 - 1.00 mg/dL   Calcium 9.0 8.9 - 10.3 mg/dL   Total Protein 6.3 (L) 6.5 - 8.1 g/dL   Albumin 2.5 (L) 3.5 - 5.0 g/dL   AST 316 (H) 15 - 41 U/L   ALT 1,342 (H) 14 - 54 U/L   Alkaline Phosphatase 248 (H) 38 - 126 U/L   Total Bilirubin 2.0 (H) 0.3 - 1.2 mg/dL   GFR calc non Af Amer 10 (L) >60 mL/min   GFR calc Af Amer 11 (L) >60 mL/min    Comment: (NOTE) The eGFR has been calculated using the CKD EPI equation. This calculation has not been validated in all clinical situations. eGFR's persistently <60 mL/min signify possible Chronic Kidney Disease.    Anion gap 14 5 - 15  Protime-INR     Status: Abnormal   Collection Time: 01/25/17  4:49 AM  Result Value Ref Range   Prothrombin Time 18.2 (H) 11.4 - 15.2 seconds   INR 1.49   Glucose, capillary     Status: Abnormal   Collection Time: 01/25/17  7:22 AM  Result Value Ref Range   Glucose-Capillary 214 (H) 65 - 99 mg/dL  Glucose, capillary     Status: Abnormal   Collection Time: 01/25/17  1:03 PM  Result Value Ref Range   Glucose-Capillary 247 (H) 65 - 99 mg/dL  Glucose, capillary     Status: Abnormal   Collection Time: 01/25/17  4:55 PM  Result Value Ref Range   Glucose-Capillary 272 (H) 65 - 99 mg/dL  Glucose, capillary     Status: Abnormal   Collection Time: 01/25/17  9:32 PM  Result Value Ref Range   Glucose-Capillary 268 (H) 65 - 99 mg/dL  CBC     Status: Abnormal   Collection Time: 01/26/17  4:30 AM  Result Value Ref Range   WBC 22.9 (H) 4.0 - 10.5 K/uL   RBC 3.17 (L) 3.87 - 5.11 MIL/uL   Hemoglobin 10.3 (L) 12.0 - 15.0 g/dL   HCT 29.5 (L) 36.0 - 46.0 %   MCV 93.1 78.0 - 100.0 fL   MCH 32.5 26.0 - 34.0 pg   MCHC 34.9 30.0 - 36.0  g/dL   RDW 14.6 11.5 - 15.5 %   Platelets 220 150 - 400 K/uL   Heparin level (unfractionated)     Status: Abnormal   Collection Time: 01/26/17  4:30 AM  Result Value Ref Range   Heparin Unfractionated 1.03 (H) 0.30 - 0.70 IU/mL    Comment:        IF HEPARIN RESULTS ARE BELOW EXPECTED VALUES, AND PATIENT DOSAGE HAS BEEN CONFIRMED, SUGGEST FOLLOW UP TESTING OF ANTITHROMBIN III LEVELS.   Comprehensive metabolic panel     Status: Abnormal   Collection Time: 01/26/17  4:30 AM  Result Value Ref Range   Sodium 130 (L) 135 - 145 mmol/L   Potassium 4.6 3.5 - 5.1 mmol/L   Chloride 91 (L) 101 - 111 mmol/L   CO2 23 22 - 32 mmol/L   Glucose, Bld 275 (H) 65 - 99 mg/dL   BUN 79 (H) 6 - 20 mg/dL   Creatinine, Ser 5.69 (H) 0.44 - 1.00 mg/dL   Calcium 8.6 (L) 8.9 - 10.3 mg/dL   Total Protein 6.3 (L) 6.5 - 8.1 g/dL   Albumin 2.5 (L) 3.5 - 5.0 g/dL   AST 102 (H) 15 - 41 U/L   ALT 939 (H) 14 - 54 U/L   Alkaline Phosphatase 232 (H) 38 - 126 U/L   Total Bilirubin 1.3 (H) 0.3 - 1.2 mg/dL   GFR calc non Af Amer 8 (L) >60 mL/min   GFR calc Af Amer 9 (L) >60 mL/min    Comment: (NOTE) The eGFR has been calculated using the CKD EPI equation. This calculation has not been validated in all clinical situations. eGFR's persistently <60 mL/min signify possible Chronic Kidney Disease.    Anion gap 16 (H) 5 - 15  Protime-INR     Status: Abnormal   Collection Time: 01/26/17  4:30 AM  Result Value Ref Range   Prothrombin Time 18.7 (H) 11.4 - 15.2 seconds   INR 1.55     ABGS  Recent Labs  01/24/17 1140  PHART 7.290*  PO2ART 85  HCO3 19.0*   CULTURES Recent Results (from the past 240 hour(s))  Culture, blood (routine x 2)     Status: Abnormal   Collection Time: 01/18/17  3:41 PM  Result Value Ref Range Status   Specimen Description LEFT ANTECUBITAL  Final   Special Requests BOTTLES DRAWN AEROBIC AND ANAEROBIC 6CC EACH  Final   Culture  Setup Time   Final    GRAM NEGATIVE RODS AEROBIC AND ANAEROBIC BOTTLES Gram Stain Report Called to,Read Back By and  Verified With: HOWARD,C AT 0725 BY HUFFINES,S ON 01/19/17    Culture (A)  Final    SERRATIA MARCESCENS SUSCEPTIBILITIES PERFORMED ON PREVIOUS CULTURE WITHIN THE LAST 5 DAYS. Performed at [facility], [place] [address], [place], [place] [address]    Report Status 01/21/2017 FINAL  Final  Culture, blood (routine x 2)     Status: Abnormal   Collection Time: 01/18/17  3:41 PM  Result Value Ref Range Status   Specimen Description BLOOD LEFT HAND  Final   Special Requests BOTTLES DRAWN AEROBIC AND ANAEROBIC [facility] EACH  Final   Culture  Setup Time   Final    GRAM NEGATIVE RODS AEROBIC AND ANAEROBIC BOTTLES Gram Stain Report Called to,Read Back By and Verified With: HOWARD,C AT 7262 BY HUFFINES,S ON 01/19/17. CRITICAL RESULT CALLED TO, READ BACK BY AND VERIFIED WITHJuel Burrow PHARMD 1535 01/19/17 A BROWNING Performed at [facility], [address], [place], [place] [address]    Culture SERRATIA MARCESCENS (A)  Final   Report Status 01/21/2017 FINAL  Final   Organism ID, Bacteria SERRATIA MARCESCENS  Final      Susceptibility   Serratia marcescens - MIC*    CEFAZOLIN >=64 RESISTANT Resistant     CEFEPIME <=1 SENSITIVE Sensitive     CEFTAZIDIME <=1 SENSITIVE Sensitive     CEFTRIAXONE <=1 SENSITIVE Sensitive     CIPROFLOXACIN <=0.25 SENSITIVE Sensitive     GENTAMICIN <=1 SENSITIVE Sensitive     TRIMETH/SULFA <=20 SENSITIVE Sensitive     * SERRATIA MARCESCENS  Blood Culture ID Panel (Reflexed)     Status: Abnormal   Collection Time: 01/18/17  3:41 PM  Result Value Ref Range Status   Enterococcus species NOT DETECTED NOT DETECTED Final   Listeria monocytogenes NOT DETECTED NOT DETECTED Final   Staphylococcus species NOT DETECTED NOT DETECTED Final   Staphylococcus aureus NOT DETECTED NOT DETECTED Final   Streptococcus species NOT DETECTED NOT DETECTED Final   Streptococcus agalactiae NOT DETECTED NOT DETECTED Final   Streptococcus pneumoniae NOT DETECTED NOT DETECTED Final    Streptococcus pyogenes NOT DETECTED NOT DETECTED Final   Acinetobacter baumannii NOT DETECTED NOT DETECTED Final   Enterobacteriaceae species DETECTED (A) NOT DETECTED Final    Comment: Enterobacteriaceae represent a large family of gram-negative bacteria, not a single organism. CRITICAL RESULT CALLED TO, READ BACK BY AND VERIFIED WITH: S HALL PHARMD 1535 01/19/17 A BROWNING    Enterobacter cloacae complex NOT DETECTED NOT DETECTED Final   Escherichia coli NOT DETECTED NOT DETECTED Final   Klebsiella oxytoca NOT DETECTED NOT DETECTED Final   Klebsiella pneumoniae NOT DETECTED NOT DETECTED Final   Proteus species NOT DETECTED NOT DETECTED Final   Serratia marcescens DETECTED (A) NOT DETECTED Final    Comment: CRITICAL RESULT CALLED TO, READ BACK BY AND VERIFIED WITH: S HALL PHARMD 1535 01/19/17 A BROWNING    Carbapenem resistance NOT DETECTED NOT DETECTED Final   Haemophilus influenzae NOT DETECTED NOT DETECTED Final   Neisseria meningitidis NOT DETECTED NOT DETECTED Final   Pseudomonas aeruginosa NOT DETECTED NOT DETECTED Final   Candida albicans NOT DETECTED NOT DETECTED Final   Candida glabrata NOT DETECTED NOT DETECTED Final   Candida krusei NOT DETECTED NOT DETECTED Final   Candida parapsilosis NOT DETECTED NOT DETECTED Final   Candida tropicalis NOT DETECTED NOT DETECTED Final    Comment: Performed at [facility], [place] [address] [place], [place] [address]  MRSA PCR Screening     Status: None   Collection Time: 01/18/17 10:25 PM  Result Value Ref Range Status   MRSA by PCR NEGATIVE NEGATIVE Final    Comment:        The GeneXpert MRSA Assay (FDA approved for NASAL specimens only), is one component of a comprehensive MRSA colonization surveillance program. It is not intended to diagnose MRSA infection nor to guide or monitor treatment for MRSA infections.    Studies/Results: Dg Chest 1 View  Result Date: 01/25/2017 CLINICAL DATA:  For comparison with VQ scan EXAM:  CHEST 1 VIEW COMPARISON:  01/20/2017 FINDINGS: Cardiomegaly with vascular congestion. Mild interstitial prominence again noted could reflect interstitial edema. No confluent opacities or effusions. No acute bony abnormality. Right internal jugular PICC line remains in place, unchanged. IMPRESSION: Cardiomegaly with vascular congestion and interstitial prominence, likely mild interstitial edema. Electronically Signed   By: Rolm Baptise M.D.   On: 01/25/2017 12:52  Nm Pulmonary Perf And Vent  Result Date: 01/25/2017 CLINICAL DATA:  Cor pulmonale EXAM: NUCLEAR MEDICINE VENTILATION - PERFUSION LUNG SCAN TECHNIQUE: Ventilation images were obtained in multiple projections using inhaled aerosol Tc-62mDTPA. Perfusion images were obtained in multiple projections after intravenous injection of Tc-916mAA. Patient unable to remove arms from imaging field in order to perform lateral views. RADIOPHARMACEUTICALS:  3.85 mCi Technetium-9950mPA aerosol inhalation and 30 mCi Technetium-93m76m IV COMPARISON:  None Correlation: Chest radiograph 01/25/2017 FINDINGS: Ventilation: Central airway deposition of tracer. Mild diminished ventilation in LEFT lower lobe. Enlargement of cardiac silhouette. No other segmental or subsegmental ventilatory defects. Perfusion: Matching diminished perfusion in LEFT lower lobe. Enlargement of cardiac silhouette. No additional segmental or subsegmental perfusion defects. Chest radiograph: Enlargement of cardiac silhouette with pulmonary vascular congestion and probable interstitial edema IMPRESSION: Matching diminished ventilation and perfusion in the LEFT lower lobe. Enlargement of cardiac silhouette. Very low probability for pulmonary embolism. Electronically Signed   By: MarkLavonia Dana.   On: 01/25/2017 13:04   Us VKoreaous Img Lower Bilateral  Result Date: 01/25/2017 CLINICAL DATA:  Bilateral lower extremity pain and edema. History of chronic lymphedema and varicose veins. Evaluate for DVT.  EXAM: BILATERAL LOWER EXTREMITY VENOUS DOPPLER ULTRASOUND TECHNIQUE: Gray-scale sonography with graded compression, as well as color Doppler and duplex ultrasound were performed to evaluate the lower extremity deep venous systems from the level of the common femoral vein and including the common femoral, femoral, profunda femoral, popliteal and calf veins including the posterior tibial, peroneal and gastrocnemius veins when visible. The superficial great saphenous vein was also interrogated. Spectral Doppler was utilized to evaluate flow at rest and with distal augmentation maneuvers in the common femoral, femoral and popliteal veins. COMPARISON:  None. FINDINGS: Examination is degraded due to patient body habitus and poor sonographic window. RIGHT LOWER EXTREMITY Common Femoral Vein: No evidence of thrombus. Normal compressibility, respiratory phasicity and response to augmentation. Saphenofemoral Junction: No evidence of thrombus. Normal compressibility and flow on color Doppler imaging. Profunda Femoral Vein: No evidence of thrombus. Normal compressibility and flow on color Doppler imaging. Femoral Vein: No evidence of thrombus. Normal compressibility, respiratory phasicity and response to augmentation. Popliteal Vein: No evidence of thrombus. Normal compressibility, respiratory phasicity and response to augmentation. Calf Veins: No evidence of thrombus. Normal compressibility and flow on color Doppler imaging. Superficial Great Saphenous Vein: No evidence of thrombus. Normal compressibility and flow on color Doppler imaging. Venous Reflux:  None. Other Findings:  None. LEFT LOWER EXTREMITY Common Femoral Vein: No evidence of thrombus. Normal compressibility, respiratory phasicity and response to augmentation. Saphenofemoral Junction: No evidence of thrombus. Normal compressibility and flow on color Doppler imaging. Profunda Femoral Vein: No evidence of thrombus. Normal compressibility and flow on color Doppler  imaging. Femoral Vein: No evidence of thrombus. Normal compressibility, respiratory phasicity and response to augmentation. Popliteal Vein: No evidence of thrombus. Normal compressibility, respiratory phasicity and response to augmentation. Calf Veins: No evidence of thrombus. Normal compressibility and flow on color Doppler imaging. Superficial Great Saphenous Vein: No evidence of thrombus. Normal compressibility and flow on color Doppler imaging. Venous Reflux:  None. Other Findings:  None. IMPRESSION: Body habitus degraded examination without definitive evidence of DVT within either lower extremity. Electronically Signed   By: JohnSandi Mariscal.   On: 01/25/2017 16:38   Us VKoreaous Img Upper Bilat  Result Date: 01/25/2017 CLINICAL DATA:  Cor pulmonale. History of CHF and morbid obesity. History of right jugular vein approach PICC line and  dialysis access within the left forearm. Evaluate for DVT. EXAM: BILATERAL UPPER EXTREMITY VENOUS DOPPLER ULTRASOUND TECHNIQUE: Gray-scale sonography with graded compression, as well as color Doppler and duplex ultrasound were performed to evaluate the bilateral upper extremity deep venous systems from the level of the subclavian vein and including the jugular, axillary, basilic, radial, ulnar and upper cephalic vein. Spectral Doppler was utilized to evaluate flow at rest and with distal augmentation maneuvers. COMPARISON:  None. FINDINGS: RIGHT UPPER EXTREMITY Internal Jugular Vein: No evidence of thrombus. Normal compressibility, respiratory phasicity and response to augmentation. Subclavian Vein: No evidence of thrombus. Normal compressibility, respiratory phasicity and response to augmentation. Axillary Vein: No evidence of thrombus. Normal compressibility, respiratory phasicity and response to augmentation. Cephalic Vein: No evidence of thrombus. Normal compressibility, respiratory phasicity and response to augmentation. Basilic Vein: No evidence of thrombus. Normal  compressibility, respiratory phasicity and response to augmentation. Brachial Veins: No evidence of thrombus. Normal compressibility, respiratory phasicity and response to augmentation. Radial Veins: No evidence of thrombus. Normal compressibility, respiratory phasicity and response to augmentation. Ulnar Veins: No evidence of thrombus. Normal compressibility, respiratory phasicity and response to augmentation. Venous Reflux:  None. Other Findings:  None. LEFT UPPER EXTREMITY Internal Jugular Vein: No evidence of thrombus. Normal compressibility, respiratory phasicity and response to augmentation. Subclavian Vein: No evidence of thrombus. Normal compressibility, respiratory phasicity and response to augmentation. Axillary Vein: No evidence of thrombus. Normal compressibility, respiratory phasicity and response to augmentation. Cephalic Vein: No evidence of thrombus. Normal compressibility, respiratory phasicity and response to augmentation. Basilic Vein: No evidence of thrombus. Normal compressibility, respiratory phasicity and response to augmentation. Brachial Veins: No evidence of thrombus. Normal compressibility, respiratory phasicity and response to augmentation. Radial Veins: No evidence of thrombus. Normal compressibility, respiratory phasicity and response to augmentation. Ulnar Veins: No evidence of thrombus. Normal compressibility, respiratory phasicity and response to augmentation. Venous Reflux:  None. Other Findings: The dialysis graft/fistula within the left forearm appears patent where imaged. IMPRESSION: 1. No evidence of DVT within either upper extremity. 2. The dialysis graft/fistula within left forearm appears patent where imaged. Electronically Signed   By: Sandi Mariscal M.D.   On: 01/25/2017 17:02    Medications:  Prior to Admission:  Prescriptions Prior to Admission  Medication Sig Dispense Refill Last Dose  . acetaminophen (TYLENOL) 325 MG tablet Take 650 mg by mouth every 6 (six) hours  as needed for moderate pain or fever.    01/17/2017 at Unknown time  . albuterol (PROVENTIL) (2.5 MG/3ML) 0.083% nebulizer solution Take 2.5 mg by nebulization every 6 (six) hours as needed for wheezing or shortness of breath.   Past Month at Unknown time  . ALPRAZolam (XANAX) 0.5 MG tablet Take 0.5 mg by mouth 3 (three) times daily as needed for anxiety or sleep.    01/17/2017 at Unknown time  . amLODipine (NORVASC) 10 MG tablet Take 0.5 tablets (5 mg total) by mouth daily. 15 tablet 12 01/17/2017 at Unknown time  . cinacalcet (SENSIPAR) 30 MG tablet Take 30 mg by mouth daily.   01/17/2017 at Unknown time  . diphenhydrAMINE (BENADRYL) 25 mg capsule Take 25 mg by mouth every 6 (six) hours as needed for allergies.   unknown  . Insulin Glargine (LANTUS SOLOSTAR) 100 UNIT/ML Solostar Pen Inject 10 Units into the skin daily at 10 pm. 15 mL 11 01/17/2017 at Unknown time  . metoprolol (LOPRESSOR) 50 MG tablet Take 0.5 tablets (25 mg total) by mouth 2 (two) times daily. 30 tablet 12 01/17/2017 at  Unknown time  . multivitamin (RENA-VIT) TABS tablet Take 1 tablet by mouth daily.   01/17/2017 at Unknown time  . Oxycodone HCl 10 MG TABS Take 1 tablet (10 mg total) by mouth 3 (three) times daily as needed (pain). (Patient taking differently: Take 10 mg by mouth 4 (four) times daily as needed (pain). ) 15 tablet 0 unknown  . sevelamer carbonate (RENVELA) 800 MG tablet Take 1,600-3,200 mg by mouth See admin instructions. Take 3200 mg by mouth 3 times daily with meals and take 1600 mg by mouth with snacks.   01/17/2017 at Unknown time  . doxycycline (VIBRAMYCIN) 50 MG capsule Take 2 capsules (100 mg total) by mouth 2 (two) times daily. (Patient not taking: Reported on 01/18/2017) 20 capsule 0 Not Taking at Unknown time  . predniSONE (STERAPRED UNI-PAK 21 TAB) 10 MG (21) TBPK tablet Take by package instructions (Patient not taking: Reported on 01/18/2017) 21 tablet 0 Not Taking at Unknown time   Scheduled: . ceFEPime  (MAXIPIME) IV  2 g Intravenous Q M,W,F-HD  . Chlorhexidine Gluconate Cloth  6 each Topical Daily  . epoetin (EPOGEN/PROCRIT) injection  8,000 Units Intravenous Q M,W,F-HD  . insulin aspart  0-9 Units Subcutaneous TID WC  . lactulose  30 g Oral TID  . mouth rinse  15 mL Mouth Rinse BID  . sodium chloride flush  10-40 mL Intracatheter Q12H  . warfarin  5 mg Oral Once  . Warfarin - Pharmacist Dosing Inpatient   Does not apply q1800   Continuous: . heparin    . phenylephrine ([name]-SYNEPHRINE) Adult infusion 13 mcg/min (01/25/17 1500)   XFG:HWEXHB chloride, sodium chloride, sodium chloride, sodium chloride, sodium chloride, sodium chloride, sodium chloride, sodium chloride, acetaminophen, albuterol, ALPRAZolam, alteplase, alteplase, alteplase, guaiFENesin-dextromethorphan, heparin, heparin, heparin, levalbuterol, lidocaine (PF), lidocaine (PF), lidocaine-prilocaine, ondansetron **OR** ondansetron (ZOFRAN) IV, oxyCODONE, pentafluoroprop-tetrafluoroeth, sodium chloride flush, technetium tetrofosmin, technetium tetrofosmin  Assesment: She was admitted with cellulitis of her leg and had septic shock from that. She has shock liver. She's had nausea. She has what appears to be cor pulmonale probably related to obesity hypoventilation. She does not have overt evidence of clots but she is high risk in the future.  She still requiring pressor support and that will be weaned today hopefully. Her cellulitis is much improved but still some tender. She has end-stage renal disease on dialysis. She has chronic diastolic heart failure and that has been mostly managed with ultrafiltration during dialysis and has done well. Principal Problem:   Fever Active Problems:   Diabetes mellitus ([place])   Morbid obesity ([place])   Obesity hypoventilation syndrome (H[facility])   Recurrent cellulitis of lower leg   Chronic diastolic CHF (congestive heart failure) (HCC)   Chronic respiratory failure with hypoxia (HCC)   ESRD on dialysis  (HCC)   Flu-like symptoms   Elevated troponin   Septic shock (HCC)   Shock liver   Cor pulmonale ([place])    Plan: Continue treatments. Wean pressors. I think probably okay to discontinue anticoagulants but will discuss with cardiology to get a consensus    LOS: 8 days   Tian Mcmurtrey L 01/26/2017, 7:50 AM

## 2017-01-26 NOTE — Progress Notes (Signed)
PT Cancellation Note  Patient Details Name: MALISA RUGGIERO MRN: [id number] DOB: [birth year]   Cancelled Treatment:    Reason Eval/Treat Not Completed: Other (comment) (Pt is currently receiving dialysis.  Will check back this afternoon.)   Beth Ross Hefferan, PT, DPT X: [phone]

## 2017-01-26 NOTE — Progress Notes (Signed)
[facility] for heparin and coumadin Indication: cor pulmonale, r/o PE  Allergies  Allergen Reactions  . Contrast Media [Iodinated Diagnostic Agents] Anaphylaxis, Hives, Swelling and Other (See Comments)    Dye for cardiac cath. Tongue swells  . Pneumococcal Vaccines Swelling    Turns skin black, and bodily swelling  . Shellfish Allergy Itching, Swelling and Other (See Comments)    Facial swelling - Pt able to eat now 11/25/16  . Vancomycin Nausea And Vomiting and Other (See Comments)    Infusion "made me feel like I was dying" had to be readmitted to hospital   Patient Measurements: Height: _0  (165.1 cm) Weight: (!) 337 lb 8.4 oz (153.1 kg) IBW/kg (Calculated) : 57 Heparin Dosing Weight: 93 kg  Vital Signs: Temp: 97.6 F (36.4 C) (02/07 0400) Temp Source: Oral (02/07 0400) BP: 97/64 (02/07 0300) Pulse Rate: 65 (02/07 0300)  Labs:  Recent Labs  01/24/17 0419 01/25/17 0449 01/26/17 0430  HGB 10.6* 10.4* 10.3*  HCT 30.9* 29.8* 29.5*  PLT 164 213 220  LABPROT  --  18.2* 18.7*  INR  --  1.49 1.55  HEPARINUNFRC 0.46 0.65 1.03*  CREATININE 6.45* 4.80* 5.69*   Estimated Creatinine Clearance: 17.8 mL/min (by C-G formula based on SCr of 5.69 mg/dL (H)).  Medical History: Past Medical History:  Diagnosis Date  . Anxiety   . Asthma    as a child  . Cancer ([place])    thyroid  . Cellulitis   . CHF (congestive heart failure) ([place])   . Chiari malformation    s/p surgery  . Chiari malformation   . Chronic pain   . Complication of anesthesia 11/28/15   Resp arrest after  conscious  sedation  . Constipation   . Coronary artery disease    40-50% mid LAD 04/29/09, Medical tx. (Dr. Gwenlyn Found)  . Diabetes mellitus    Type II  . Fibromyalgia   . History of blood transfusion    hemorrage duinrg pregancy  . Hx of echocardiogram 10/2011   EF 55-60%  . Hypercholesterolemia   . Hypertension   . Lymph edema   . Obesity hypoventilation  syndrome ([place])   . On home O2   . Pneumonia    in past  . Renal disorder    Pt started dialysis in Dec.2016  . S/P colonoscopy 05/26/2007   Dr. Laural Golden sigmoid diverticulosis random biopsies benign  . S/P endoscopy 05/01/2009   Dr. Penelope Coop pill-induced esophageal ulcerations distal to midesophagus, 2 small ulcers in the antrum of the stomach  . Shortness of breath dyspnea    with any exertion or if heart rate  is irregular while on dialysis  . Sleep apnea    Medications:  Prescriptions Prior to Admission  Medication Sig Dispense Refill Last Dose  . acetaminophen (TYLENOL) 325 MG tablet Take 650 mg by mouth every 6 (six) hours as needed for moderate pain or fever.    01/17/2017 at Unknown time  . albuterol (PROVENTIL) (2.5 MG/3ML) 0.083% nebulizer solution Take 2.5 mg by nebulization every 6 (six) hours as needed for wheezing or shortness of breath.   Past Month at Unknown time  . ALPRAZolam (XANAX) 0.5 MG tablet Take 0.5 mg by mouth 3 (three) times daily as needed for anxiety or sleep.    01/17/2017 at Unknown time  . amLODipine (NORVASC) 10 MG tablet Take 0.5 tablets (5 mg total) by mouth daily. 15 tablet 12 01/17/2017 at Unknown time  .  cinacalcet (SENSIPAR) 30 MG tablet Take 30 mg by mouth daily.   01/17/2017 at Unknown time  . diphenhydrAMINE (BENADRYL) 25 mg capsule Take 25 mg by mouth every 6 (six) hours as needed for allergies.   unknown  . Insulin Glargine (LANTUS SOLOSTAR) 100 UNIT/ML Solostar Pen Inject 10 Units into the skin daily at 10 pm. 15 mL 11 01/17/2017 at Unknown time  . metoprolol (LOPRESSOR) 50 MG tablet Take 0.5 tablets (25 mg total) by mouth 2 (two) times daily. 30 tablet 12 01/17/2017 at Unknown time  . multivitamin (RENA-VIT) TABS tablet Take 1 tablet by mouth daily.   01/17/2017 at Unknown time  . Oxycodone HCl 10 MG TABS Take 1 tablet (10 mg total) by mouth 3 (three) times daily as needed (pain). (Patient taking differently: Take 10 mg by mouth 4 (four) times daily as  needed (pain). ) 15 tablet 0 unknown  . sevelamer carbonate (RENVELA) 800 MG tablet Take 1,600-3,200 mg by mouth See admin instructions. Take 3200 mg by mouth 3 times daily with meals and take 1600 mg by mouth with snacks.   01/17/2017 at Unknown time  . doxycycline (VIBRAMYCIN) 50 MG capsule Take 2 capsules (100 mg total) by mouth 2 (two) times daily. (Patient not taking: Reported on 01/18/2017) 20 capsule 0 Not Taking at Unknown time  . predniSONE (STERAPRED UNI-PAK 21 TAB) 10 MG (21) TBPK tablet Take by package instructions (Patient not taking: Reported on 01/18/2017) 21 tablet 0 Not Taking at Unknown time   Assessment: 51 yo lady to start heparin for cor pulmonale, r/o PE. Her Heparin level this am is SUPRAtherapeutic.   Ventilation/perfusion lung scan is reported as very low probability for PE.  Coumadin has been resumed. INR today is 1.55 (trending up)  Goal of Therapy:  Heparin level 0.3-0.7 units/ml Monitor platelets by anticoagulation protocol: Yes  INR 2-3   Plan:  Coumadin 55m today x 1 Hold Heparin x 1 hours then reduce Heparin infusion to 2300 units/hr Recheck heparin level in 6-8 hours Daily HL and CBC Monitor for bleeding complications  Thanks for allowing pharmacy to be a part of this patient's care.  SHart Robinsons PharmD Clinical Pharmacist Pager:  [phone]/06/2017   01/26/2017,7:44 AM

## 2017-01-27 ENCOUNTER — Inpatient Hospital Stay (HOSPITAL_COMMUNITY): Payer: Medicare Other

## 2017-01-27 DIAGNOSIS — I214 Non-ST elevation (NSTEMI) myocardial infarction: Secondary | ICD-10-CM

## 2017-01-27 DIAGNOSIS — A419 Sepsis, unspecified organism: Secondary | ICD-10-CM

## 2017-01-27 DIAGNOSIS — I890 Lymphedema, not elsewhere classified: Secondary | ICD-10-CM

## 2017-01-27 DIAGNOSIS — R6521 Severe sepsis with septic shock: Secondary | ICD-10-CM

## 2017-01-27 LAB — GLUCOSE, CAPILLARY
Glucose-Capillary: 173 mg/dL — ABNORMAL HIGH (ref 65–99)
Glucose-Capillary: 174 mg/dL — ABNORMAL HIGH (ref 65–99)
Glucose-Capillary: 181 mg/dL — ABNORMAL HIGH (ref 65–99)
Glucose-Capillary: 210 mg/dL — ABNORMAL HIGH (ref 65–99)

## 2017-01-27 LAB — CBC
HEMATOCRIT: 27.7 % — AB (ref 36.0–46.0)
HEMOGLOBIN: 9.6 g/dL — AB (ref 12.0–15.0)
MCH: 32.7 pg (ref 26.0–34.0)
MCHC: 34.7 g/dL (ref 30.0–36.0)
MCV: 94.2 fL (ref 78.0–100.0)
Platelets: 196 10*3/uL (ref 150–400)
RBC: 2.94 MIL/uL — ABNORMAL LOW (ref 3.87–5.11)
RDW: 14.8 % (ref 11.5–15.5)
WBC: 22.6 10*3/uL — ABNORMAL HIGH (ref 4.0–10.5)

## 2017-01-27 LAB — COMPREHENSIVE METABOLIC PANEL
ALK PHOS: 186 U/L — AB (ref 38–126)
ALT: 570 U/L — AB (ref 14–54)
AST: 47 U/L — ABNORMAL HIGH (ref 15–41)
Albumin: 2.2 g/dL — ABNORMAL LOW (ref 3.5–5.0)
Anion gap: 13 (ref 5–15)
BILIRUBIN TOTAL: 1.1 mg/dL (ref 0.3–1.2)
BUN: 50 mg/dL — ABNORMAL HIGH (ref 6–20)
CALCIUM: 8.2 mg/dL — AB (ref 8.9–10.3)
CO2: 26 mmol/L (ref 22–32)
CREATININE: 4.34 mg/dL — AB (ref 0.44–1.00)
Chloride: 95 mmol/L — ABNORMAL LOW (ref 101–111)
GFR, EST AFRICAN AMERICAN: 13 mL/min — AB (ref >60)
GFR, EST NON AFRICAN AMERICAN: 11 mL/min — AB (ref >60)
Glucose, Bld: 236 mg/dL — ABNORMAL HIGH (ref 65–99)
Potassium: 3.4 mmol/L — ABNORMAL LOW (ref 3.5–5.1)
Sodium: 134 mmol/L — ABNORMAL LOW (ref 135–145)
Total Protein: 5.8 g/dL — ABNORMAL LOW (ref 6.5–8.1)

## 2017-01-27 LAB — PROTIME-INR
INR: 1.99
PROTHROMBIN TIME: 22.9 s — AB (ref 11.4–15.2)

## 2017-01-27 LAB — HEPARIN LEVEL (UNFRACTIONATED)
Heparin Unfractionated: 1.38 IU/mL — ABNORMAL HIGH (ref 0.30–0.70)
Heparin Unfractionated: 0.85 IU/mL — ABNORMAL HIGH (ref 0.30–0.70)

## 2017-01-27 MED ORDER — WARFARIN SODIUM 5 MG PO TABS
5.0000 mg | ORAL_TABLET | Freq: Once | ORAL | Status: AC
  Administered 2017-01-27: 5 mg via ORAL
  Filled 2017-01-27: qty 1

## 2017-01-27 MED ORDER — SODIUM CHLORIDE 0.9 % IV SOLN
8.0000 mg | Freq: Four times a day (QID) | INTRAVENOUS | Status: DC
  Administered 2017-01-27 – 2017-02-01 (×19): 8 mg via INTRAVENOUS
  Filled 2017-01-27 (×26): qty 4

## 2017-01-27 NOTE — Evaluation (Signed)
Physical Therapy Evaluation Patient Details Name: [name] MRN: [id number] DOB: 05/25/1966 Today's Date: 01/27/2017   History of Present Illness  51 yo F was admitted with cellulitis of her left leg. This is in the leg that has had previous episodes of cellulitis and she has bilateral severe lymphedema at baseline. She will had sepsis with septic shock.  Cor pulmonale with severe RV dysfunction and pulmonary hypertension, PA SP 85 mmHg by echocardiogram. Although not proven by VQ scan or venous Dopplers, pulmonary thromboembolic disease has been a concern and Coumadin initiated.  Elevated troponin I up to 5.3. No active chest pain. Demand ischemia has been suspected based on chart review, NSTEMI not excluded  Clinical Impression  Pt received in bed and was agreeable to PT evaluation.  Pt reports that prior to becoming ill, she was able to perform a SPT from her hospital bed<>motorized w/c.  She occasionally uses a RW to perform this transfer.  She states she is independent with dressing and bathing.  She is able to occasionally get out into the community with her niece to run errands.  During today's PT evaluation she required Max A for transfer supine<>sit.  She was able to sit on the EOB for ~40mn, however limited due to dizziness and nausea.  She required Max/Total A +2 for transfer sit<>supine and supine scoot for positioning in the bed.  At this point she would benefit from SNF to optimize her return to PLOF.      Follow Up Recommendations SNF    Equipment Recommendations  None recommended by PT    Recommendations for Other Services OT consult     Precautions / Restrictions Precautions Precautions: Fall Precaution Comments: due to immobility Restrictions Weight Bearing Restrictions: No LLE Weight Bearing: Weight bearing as tolerated Other Position/Activity Restrictions: Pt does not place weight through L LE due to pain.       Mobility  Bed Mobility Overal bed mobility:  Needs Assistance Bed Mobility: Supine to Sit;Sit to Supine     Supine to sit: Max assist;HOB elevated Sit to supine: Total assist;+2 for safety/equipment   General bed mobility comments: increased time, and assistance to advance B LE's.  Use of bed pad to cradle her L LE due to pain as well as size.  Once sitting on the EOB pt becomes dizzy and c/o nausea.  She was able to sit on the EOB for ~5 min before needing to lay back down.  Once pt was back in the bed, the bed was positioned in trendelenburg, and pt was able to use B UE's to assist with pulling herself up in the bed, however she still required +2 person assist for this.   Transfers                    Ambulation/Gait                Stairs            Wheelchair Mobility    Modified Rankin (Stroke Patients Only)       Balance Overall balance assessment: Needs assistance Sitting-balance support: Bilateral upper extremity supported;Feet supported Sitting balance-Leahy Scale: Fair Sitting balance - Comments: Pt required support of B UE's on EOB.                                      Pertinent Vitals/Pain Pain Assessment: 0-10 Pain  Score: 6  Pain Location: L LE and foot Pain Descriptors / Indicators: Aching Pain Intervention(s): Limited activity within patient's tolerance;Monitored during session;Repositioned    Home Living   Living Arrangements: Spouse/significant other (and son.  Husband is in a w/c)   Type of Home: House Home Access: Ramped entrance     Home Layout: One level Home Equipment: Wheelchair - manual;Hospital bed;Wheelchair - power;Walker - 2 wheels      Prior Function Level of Independence: Independent with assistive device(s)   Gait / Transfers Assistance Needed: Pt states that sometimes she uses the RW for her transfer bed<>w/c, but most of the time she is independent with this transfer.  She has not been able to perform the transfer for a few weeks due to being  ill.    ADL's / Homemaking Assistance Needed: independent with dressing and bathing.  Pt states her niece will help with running errands.  Occasionally the pt will go with the niece to run the errands, but most of the time she gives her a list.         Hand Dominance   Dominant Hand: Right    Extremity/Trunk Assessment   Upper Extremity Assessment Upper Extremity Assessment: Generalized weakness    Lower Extremity Assessment Lower Extremity Assessment: Generalized weakness       Communication      Cognition Arousal/Alertness: Awake/alert Behavior During Therapy: WFL for tasks assessed/performed Overall Cognitive Status: Within Functional Limits for tasks assessed                 General Comments: Pt is forgetful during tx - After a few minutes she does not recall what the PT was asking her to do    General Comments      Exercises     Assessment/Plan    PT Assessment Patient needs continued PT services  PT Problem List Decreased strength;Decreased activity tolerance;Decreased balance;Decreased mobility;Decreased coordination;Cardiopulmonary status limiting activity;Decreased skin integrity;Obesity;Pain          PT Treatment Interventions DME instruction;Functional mobility training;Therapeutic activities;Therapeutic exercise;Patient/family education;Balance training;Wheelchair mobility training    PT Goals (Current goals can be found in the Care Plan section)  Acute Rehab PT Goals Patient Stated Goal: To get stronger and be able to move like she was before.  PT Goal Formulation: With patient Time For Goal Achievement: 02/10/17 Potential to Achieve Goals: Fair    Frequency Min 3X/week   Barriers to discharge Decreased caregiver support Pt lives with husband and son, however the husband is also in a w/c.     Co-evaluation               End of Session Equipment Utilized During Treatment: Oxygen Activity Tolerance: Patient tolerated treatment  well;Other (comment) (dizziness and nausea) Patient left: in bed;with call bell/phone within reach;with family/visitor present Nurse Communication: Mobility status;Need for lift equipment (mobility sheet left hanging in the room.  Will need Maxi move for transfers out of the bed<>chair. )    Functional Assessment Tool Used: [employer]	[place] AM-PAC "6-clicks"  Functional Limitation: Mobility: Walking and moving around Mobility: Walking and Moving Around Current Status [phone]): At least 60 percent but less than 80 percent impaired, limited or restricted Mobility: Walking and Moving Around Goal Status [phone]): At least 40 percent but less than 60 percent impaired, limited or restricted    Time: 1040-1120 PT Time Calculation (min) (ACUTE ONLY): 40 min   Charges:   PT Evaluation $PT Eval Moderate Complexity: 1 Procedure PT Treatments $Therapeutic Activity:  23-37 mins   PT G Codes:   PT G-Codes **NOT FOR INPATIENT CLASS** Functional Assessment Tool Used: [employer] "6-clicks"  Functional Limitation: Mobility: Walking and moving around Mobility: Walking and Moving Around Current Status [phone]): At least 60 percent but less than 80 percent impaired, limited or restricted Mobility: Walking and Moving Around Goal Status [phone]): At least 40 percent but less than 60 percent impaired, limited or restricted    Beth Toddy Boyd, PT, DPT X: [phone]

## 2017-01-27 NOTE — Progress Notes (Signed)
Progress Note  Patient Name: [name] Date of Encounter: 01/27/2017  Primary Cardiologist: Dr. Kate Sable  Subjective   Awake and alert but says she has felt "confused." No chest pain.  Inpatient Medications    Scheduled Meds: . ceFEPime (MAXIPIME) IV  2 g Intravenous Q M,W,F-HD  . Chlorhexidine Gluconate Cloth  6 each Topical Daily  . epoetin (EPOGEN/PROCRIT) injection  8,000 Units Intravenous Q M,W,F-HD  . insulin aspart  0-9 Units Subcutaneous TID WC  . mouth rinse  15 mL Mouth Rinse BID  . ondansetron (ZOFRAN) IV  8 mg Intravenous Q6H  . sodium chloride flush  10-40 mL Intracatheter Q12H  . warfarin  5 mg Oral Once  . Warfarin - Pharmacist Dosing Inpatient   Does not apply q1800   Continuous Infusions: . heparin 1,700 Units/hr (01/27/17 0759)  . phenylephrine ([name]-SYNEPHRINE) Adult infusion Stopped (01/26/17 1300)   PRN Meds: sodium chloride, sodium chloride, acetaminophen, albuterol, ALPRAZolam, alteplase, guaiFENesin-dextromethorphan, heparin, levalbuterol, ondansetron **OR** ondansetron (ZOFRAN) IV, oxyCODONE, sodium chloride flush, technetium tetrofosmin, technetium tetrofosmin   Vital Signs    Vitals:   01/27/17 0000 01/27/17 0400 01/27/17 0500 01/27/17 0800  BP:    117/67  Pulse:    79  Resp:    14  Temp: 98.1 F (36.7 C) 98.1 F (36.7 C)    TempSrc: Axillary Oral    SpO2:    97%  Weight:   (!) 327 lb 9.7 oz (148.6 kg)   Height:        Intake/Output Summary (Last 24 hours) at 01/27/17 0807 Last data filed at 01/26/17 1845  Gross per 24 hour  Intake           866.79 ml  Output             2000 ml  Net         -1133.21 ml   Filed Weights   01/26/17 0500 01/26/17 0845 01/27/17 0500  Weight: (!) 337 lb 8.4 oz (153.1 kg) (!) 337 lb 8.4 oz (153.1 kg) (!) 327 lb 9.7 oz (148.6 kg)    Telemetry    I personally reviewed telemetry monitoring which shows sinus rhythm in lead artifact.  ECG    I personally reviewed the tracing from  01/20/2017 which shows sinus rhythm with inferior Q waves, rule out old inferior infarct pattern.  Physical Exam   GEN: Obese woman, no acute distress.   Neck:  Increased JVP. Cardiac: RRR, no gallops.  Respiratory: Clear to auscultation bilaterally. GI: Soft, nontender, non-distended  MS:  Significant lymphedema.  Labs    Chemistry Recent Labs Lab 01/25/17 0449 01/26/17 0430 01/27/17 0457  NA 132* 130* 134*  K 4.6 4.6 3.4*  CL 93* 91* 95*  CO2 _0 GLUCOSE 220* 275* 236*  BUN 56* 79* 50*  CREATININE 4.80* 5.69* 4.34*  CALCIUM 9.0 8.6* 8.2*  PROT 6.3* 6.3* 5.8*  ALBUMIN 2.5* 2.5* 2.2*  AST 316* 102* 47*  ALT 1,342* 939* 570*  ALKPHOS 248* 232* 186*  BILITOT 2.0* 1.3* 1.1  GFRNONAA 10* 8* 11*  GFRAA 11* 9* 13*  ANIONGAP 14 16* 13     Hematology Recent Labs Lab 01/25/17 0449 01/26/17 0430 01/27/17 0457  WBC 19.6* 22.9* 22.6*  RBC 3.20* 3.17* 2.94*  HGB 10.4* 10.3* 9.6*  HCT 29.8* 29.5* 27.7*  MCV 93.1 93.1 94.2  MCH 32.5 32.5 32.7  MCHC 34.9 34.9 34.7  RDW 14.6 14.6 14.8  PLT 213 220 196  Cardiac Enzymes Recent Labs Lab 01/20/17 0813 01/20/17 1333 01/20/17 1944 01/21/17 0800  TROPONINI 0.21* 0.97* 3.57* 5.31*   No results for input(s): TROPIPOC in the last 168 hours.    DDimer  Recent Labs Lab 01/24/17 0419  DDIMER 7.87*     Radiology    Dg Chest 1 View  Result Date: 01/25/2017 CLINICAL DATA:  For comparison with VQ scan EXAM: CHEST 1 VIEW COMPARISON:  01/20/2017 FINDINGS: Cardiomegaly with vascular congestion. Mild interstitial prominence again noted could reflect interstitial edema. No confluent opacities or effusions. No acute bony abnormality. Right internal jugular PICC line remains in place, unchanged. IMPRESSION: Cardiomegaly with vascular congestion and interstitial prominence, likely mild interstitial edema. Electronically Signed   By: Rolm Baptise M.D.   On: 01/25/2017 12:52   Nm Pulmonary Perf And Vent  Result Date:  01/25/2017 CLINICAL DATA:  Cor pulmonale EXAM: NUCLEAR MEDICINE VENTILATION - PERFUSION LUNG SCAN TECHNIQUE: Ventilation images were obtained in multiple projections using inhaled aerosol [id number]. Perfusion images were obtained in multiple projections after intravenous injection of Tc-964mAA. Patient unable to remove arms from imaging field in order to perform lateral views. RADIOPHARMACEUTICALS:  3.85 mCi Technetium-9993mPA aerosol inhalation and 30 mCi Technetium-74m76m IV COMPARISON:  None Correlation: Chest radiograph 01/25/2017 FINDINGS: Ventilation: Central airway deposition of tracer. Mild diminished ventilation in LEFT lower lobe. Enlargement of cardiac silhouette. No other segmental or subsegmental ventilatory defects. Perfusion: Matching diminished perfusion in LEFT lower lobe. Enlargement of cardiac silhouette. No additional segmental or subsegmental perfusion defects. Chest radiograph: Enlargement of cardiac silhouette with pulmonary vascular congestion and probable interstitial edema IMPRESSION: Matching diminished ventilation and perfusion in the LEFT lower lobe. Enlargement of cardiac silhouette. Very low probability for pulmonary embolism. Electronically Signed   By: MarkLavonia Dana.   On: 01/25/2017 13:04   Us VKoreaous Img Lower Bilateral  Result Date: 01/25/2017 CLINICAL DATA:  Bilateral lower extremity pain and edema. History of chronic lymphedema and varicose veins. Evaluate for DVT. EXAM: BILATERAL LOWER EXTREMITY VENOUS DOPPLER ULTRASOUND TECHNIQUE: Gray-scale sonography with graded compression, as well as color Doppler and duplex ultrasound were performed to evaluate the lower extremity deep venous systems from the level of the common femoral vein and including the common femoral, femoral, profunda femoral, popliteal and calf veins including the posterior tibial, peroneal and gastrocnemius veins when visible. The superficial great saphenous vein was also interrogated. Spectral Doppler  was utilized to evaluate flow at rest and with distal augmentation maneuvers in the common femoral, femoral and popliteal veins. COMPARISON:  None. FINDINGS: Examination is degraded due to patient body habitus and poor sonographic window. RIGHT LOWER EXTREMITY Common Femoral Vein: No evidence of thrombus. Normal compressibility, respiratory phasicity and response to augmentation. Saphenofemoral Junction: No evidence of thrombus. Normal compressibility and flow on color Doppler imaging. Profunda Femoral Vein: No evidence of thrombus. Normal compressibility and flow on color Doppler imaging. Femoral Vein: No evidence of thrombus. Normal compressibility, respiratory phasicity and response to augmentation. Popliteal Vein: No evidence of thrombus. Normal compressibility, respiratory phasicity and response to augmentation. Calf Veins: No evidence of thrombus. Normal compressibility and flow on color Doppler imaging. Superficial Great Saphenous Vein: No evidence of thrombus. Normal compressibility and flow on color Doppler imaging. Venous Reflux:  None. Other Findings:  None. LEFT LOWER EXTREMITY Common Femoral Vein: No evidence of thrombus. Normal compressibility, respiratory phasicity and response to augmentation. Saphenofemoral Junction: No evidence of thrombus. Normal compressibility and flow on color Doppler imaging. Profunda Femoral Vein: No evidence  of thrombus. Normal compressibility and flow on color Doppler imaging. Femoral Vein: No evidence of thrombus. Normal compressibility, respiratory phasicity and response to augmentation. Popliteal Vein: No evidence of thrombus. Normal compressibility, respiratory phasicity and response to augmentation. Calf Veins: No evidence of thrombus. Normal compressibility and flow on color Doppler imaging. Superficial Great Saphenous Vein: No evidence of thrombus. Normal compressibility and flow on color Doppler imaging. Venous Reflux:  None. Other Findings:  None. IMPRESSION: Body  habitus degraded examination without definitive evidence of DVT within either lower extremity. Electronically Signed   By: Sandi Mariscal M.D.   On: 01/25/2017 16:38   US Venous Img Upper Bilat  Result Date: 01/25/2017 CLINICAL DATA:  Cor pulmonale. History of CHF and morbid obesity. History of right jugular vein approach PICC line and dialysis access within the left forearm. Evaluate for DVT. EXAM: BILATERAL UPPER EXTREMITY VENOUS DOPPLER ULTRASOUND TECHNIQUE: Gray-scale sonography with graded compression, as well as color Doppler and duplex ultrasound were performed to evaluate the bilateral upper extremity deep venous systems from the level of the subclavian vein and including the jugular, axillary, basilic, radial, ulnar and upper cephalic vein. Spectral Doppler was utilized to evaluate flow at rest and with distal augmentation maneuvers. COMPARISON:  None. FINDINGS: RIGHT UPPER EXTREMITY Internal Jugular Vein: No evidence of thrombus. Normal compressibility, respiratory phasicity and response to augmentation. Subclavian Vein: No evidence of thrombus. Normal compressibility, respiratory phasicity and response to augmentation. Axillary Vein: No evidence of thrombus. Normal compressibility, respiratory phasicity and response to augmentation. Cephalic Vein: No evidence of thrombus. Normal compressibility, respiratory phasicity and response to augmentation. Basilic Vein: No evidence of thrombus. Normal compressibility, respiratory phasicity and response to augmentation. Brachial Veins: No evidence of thrombus. Normal compressibility, respiratory phasicity and response to augmentation. Radial Veins: No evidence of thrombus. Normal compressibility, respiratory phasicity and response to augmentation. Ulnar Veins: No evidence of thrombus. Normal compressibility, respiratory phasicity and response to augmentation. Venous Reflux:  None. Other Findings:  None. LEFT UPPER EXTREMITY Internal Jugular Vein: No evidence of  thrombus. Normal compressibility, respiratory phasicity and response to augmentation. Subclavian Vein: No evidence of thrombus. Normal compressibility, respiratory phasicity and response to augmentation. Axillary Vein: No evidence of thrombus. Normal compressibility, respiratory phasicity and response to augmentation. Cephalic Vein: No evidence of thrombus. Normal compressibility, respiratory phasicity and response to augmentation. Basilic Vein: No evidence of thrombus. Normal compressibility, respiratory phasicity and response to augmentation. Brachial Veins: No evidence of thrombus. Normal compressibility, respiratory phasicity and response to augmentation. Radial Veins: No evidence of thrombus. Normal compressibility, respiratory phasicity and response to augmentation. Ulnar Veins: No evidence of thrombus. Normal compressibility, respiratory phasicity and response to augmentation. Venous Reflux:  None. Other Findings: The dialysis graft/fistula within the left forearm appears patent where imaged. IMPRESSION: 1. No evidence of DVT within either upper extremity. 2. The dialysis graft/fistula within left forearm appears patent where imaged. Electronically Signed   By: Sandi Mariscal M.D.   On: 01/25/2017 17:02    Cardiac Studies   Echocardiogram 01/21/2017: Study Conclusions  - Left ventricle: Septal flattening consistent with RV volume /   pressure overload. The cavity size was normal. Wall thickness was   increased in a pattern of mild LVH. Systolic function was   vigorous. The estimated ejection fraction was in the range of 65%   to 70%. Doppler parameters are consistent with abnormal left   ventricular relaxation (grade 1 diastolic dysfunction). - Right ventricle: The cavity size was severely dilated. Systolic   function was moderately  reduced. - Right atrium: The atrium was moderately dilated. - Pulmonary arteries: PA peak pressure: 85 mm Hg (S). - Impressions: No significant change from report of  June 2017.  Impressions:  - No significant change from report of June 2017.  Patient Profile     51 y.o. female with history of ESRD on hemodialysis, chronic lymphedema with cellulitis, OSA with chronic hypoxic respiratory failure, currently admitted with lower extremity discomfort and volume overload as well as fever and gram-negative bacteremia. D-dimer elevated to 7.87 although VQ scan low probability for PE and upper as well as lower extremity venous Dopplers negative for DVT. She is being treated with Coumadin given remaining concern for thromboembolic disease, particularly in light of severe pulmonary hypertension with cor pulmonale. Cardiac enzymes are also elevated with troponin I up to 5.3. ECG shows inferior Q waves however echocardiogram reveals LVEF 65-70% without wall motion abnormalities. Demand ischemia has been suspected although NSTEMI not excluded. She is not reporting chest pain.  Assessment & Plan    1. Cor pulmonale with severe RV dysfunction and pulmonary hypertension, PA SP 85 mmHg by echocardiogram. Although not proven by VQ scan or venous Dopplers, pulmonary thromboembolic disease has been a concern and Coumadin initiated. She does have a history of hypoventilation syndrome with chronic hypoxic respiratory failure which could be contributing as well.  2. Elevated troponin I up to 5.3. No active chest pain. Demand ischemia has been suspected based on chart review, NSTEMI not excluded. LVEF is vigorous by echocardiogram without wall motion abnormalities however.  3. ESRD on hemodialysis.  4. Gram-negative bacteremia with sepsis. Patient was requiring [name]-Synephrine for blood pressure support, this has been weaned off however. She is on broad-spectrum prior antibiotics per primary team.  5. Chronic lymphedema.  Patient on heparin bridging to Coumadin per pharmacy. Volume status being managed by hemodialysis. She is now off pressors. Do not anticipate ischemic workup at  this time, can be considered at a later date. ID work-up per primary team and Dr. [name].  Signed, Rozann Lesches, MD  01/27/2017, 8:07 AM

## 2017-01-27 NOTE — Progress Notes (Signed)
Subjective: Interval History: Patient feels slightly better. However she continued to have nausea and vomiting. Patient also stated that she has some diarrhea for the last 2-3 days. She denies any difficulty in breathing.  Objective: Vital signs in last 24 hours: Temp:  [97.7 F (36.5 C)-98.1 F (36.7 C)] 98.1 F (36.7 C) (02/08 0400) Pulse Rate:  [69-79] 79 (02/08 0800) Resp:  [14-20] 14 (02/08 0800) BP: (79-141)/(55-119) 117/67 (02/08 0800) SpO2:  [94 %-100 %] 97 % (02/08 0800) Weight:  [148.6 kg (327 lb 9.7 oz)] 148.6 kg (327 lb 9.7 oz) (02/08 0500) Weight change: 0 kg (0 lb)  Intake/Output from previous day: 02/07 0701 - 02/08 0700 In: 870.6 [P.O.:60; I.V.:710.6; IV Piggyback:100] Out: 2000  Intake/Output this shift: Total I/O In: 560.7 [P.O.:240; I.V.:320.7] Out: -   General appearance: alert, cooperative and no distress Resp: diminished breath sounds anterior - bilateral and wheezes bilaterally Cardio: regular rate and rhythm Extremities: edema Patient with venous stasis and chronic leg edema  Lab Results:  Recent Labs  01/26/17 0430 01/27/17 0457  WBC 22.9* 22.6*  HGB 10.3* 9.6*  HCT 29.5* 27.7*  PLT 220 196   BMET:   Recent Labs  01/26/17 0430 01/27/17 0457  NA 130* 134*  K 4.6 3.4*  CL 91* 95*  CO2 23 26  GLUCOSE 275* 236*  BUN 79* 50*  CREATININE 5.69* 4.34*  CALCIUM 8.6* 8.2*   No results for input(s): PTH in the last 72 hours. Iron Studies: No results for input(s): IRON, TIBC, TRANSFERRIN, FERRITIN in the last 72 hours.  Studies/Results: Dg Chest 1 View  Result Date: 01/25/2017 CLINICAL DATA:  For comparison with VQ scan EXAM: CHEST 1 VIEW COMPARISON:  01/20/2017 FINDINGS: Cardiomegaly with vascular congestion. Mild interstitial prominence again noted could reflect interstitial edema. No confluent opacities or effusions. No acute bony abnormality. Right internal jugular PICC line remains in place, unchanged. IMPRESSION: Cardiomegaly with  vascular congestion and interstitial prominence, likely mild interstitial edema. Electronically Signed   By: Rolm Baptise M.D.   On: 01/25/2017 12:52   Nm Pulmonary Perf And Vent  Result Date: 01/25/2017 CLINICAL DATA:  Cor pulmonale EXAM: NUCLEAR MEDICINE VENTILATION - PERFUSION LUNG SCAN TECHNIQUE: Ventilation images were obtained in multiple projections using inhaled aerosol Tc-13mDTPA. Perfusion images were obtained in multiple projections after intravenous injection of Tc-971mAA. Patient unable to remove arms from imaging field in order to perform lateral views. RADIOPHARMACEUTICALS:  3.85 mCi Technetium-9961mPA aerosol inhalation and 30 mCi Technetium-61m37m IV COMPARISON:  None Correlation: Chest radiograph 01/25/2017 FINDINGS: Ventilation: Central airway deposition of tracer. Mild diminished ventilation in LEFT lower lobe. Enlargement of cardiac silhouette. No other segmental or subsegmental ventilatory defects. Perfusion: Matching diminished perfusion in LEFT lower lobe. Enlargement of cardiac silhouette. No additional segmental or subsegmental perfusion defects. Chest radiograph: Enlargement of cardiac silhouette with pulmonary vascular congestion and probable interstitial edema IMPRESSION: Matching diminished ventilation and perfusion in the LEFT lower lobe. Enlargement of cardiac silhouette. Very low probability for pulmonary embolism. Electronically Signed   By: MarkLavonia Dana.   On: 01/25/2017 13:04   Us VKoreaous Img Lower Bilateral  Result Date: 01/25/2017 CLINICAL DATA:  Bilateral lower extremity pain and edema. History of chronic lymphedema and varicose veins. Evaluate for DVT. EXAM: BILATERAL LOWER EXTREMITY VENOUS DOPPLER ULTRASOUND TECHNIQUE: Gray-scale sonography with graded compression, as well as color Doppler and duplex ultrasound were performed to evaluate the lower extremity deep venous systems from the level of the common femoral vein and  including the common femoral, femoral,  profunda femoral, popliteal and calf veins including the posterior tibial, peroneal and gastrocnemius veins when visible. The superficial great saphenous vein was also interrogated. Spectral Doppler was utilized to evaluate flow at rest and with distal augmentation maneuvers in the common femoral, femoral and popliteal veins. COMPARISON:  None. FINDINGS: Examination is degraded due to patient body habitus and poor sonographic window. RIGHT LOWER EXTREMITY Common Femoral Vein: No evidence of thrombus. Normal compressibility, respiratory phasicity and response to augmentation. Saphenofemoral Junction: No evidence of thrombus. Normal compressibility and flow on color Doppler imaging. Profunda Femoral Vein: No evidence of thrombus. Normal compressibility and flow on color Doppler imaging. Femoral Vein: No evidence of thrombus. Normal compressibility, respiratory phasicity and response to augmentation. Popliteal Vein: No evidence of thrombus. Normal compressibility, respiratory phasicity and response to augmentation. Calf Veins: No evidence of thrombus. Normal compressibility and flow on color Doppler imaging. Superficial Great Saphenous Vein: No evidence of thrombus. Normal compressibility and flow on color Doppler imaging. Venous Reflux:  None. Other Findings:  None. LEFT LOWER EXTREMITY Common Femoral Vein: No evidence of thrombus. Normal compressibility, respiratory phasicity and response to augmentation. Saphenofemoral Junction: No evidence of thrombus. Normal compressibility and flow on color Doppler imaging. Profunda Femoral Vein: No evidence of thrombus. Normal compressibility and flow on color Doppler imaging. Femoral Vein: No evidence of thrombus. Normal compressibility, respiratory phasicity and response to augmentation. Popliteal Vein: No evidence of thrombus. Normal compressibility, respiratory phasicity and response to augmentation. Calf Veins: No evidence of thrombus. Normal compressibility and flow on  color Doppler imaging. Superficial Great Saphenous Vein: No evidence of thrombus. Normal compressibility and flow on color Doppler imaging. Venous Reflux:  None. Other Findings:  None. IMPRESSION: Body habitus degraded examination without definitive evidence of DVT within either lower extremity. Electronically Signed   By: Sandi Mariscal M.D.   On: 01/25/2017 16:38   US Venous Img Upper Bilat  Result Date: 01/25/2017 CLINICAL DATA:  Cor pulmonale. History of CHF and morbid obesity. History of right jugular vein approach PICC line and dialysis access within the left forearm. Evaluate for DVT. EXAM: BILATERAL UPPER EXTREMITY VENOUS DOPPLER ULTRASOUND TECHNIQUE: Gray-scale sonography with graded compression, as well as color Doppler and duplex ultrasound were performed to evaluate the bilateral upper extremity deep venous systems from the level of the subclavian vein and including the jugular, axillary, basilic, radial, ulnar and upper cephalic vein. Spectral Doppler was utilized to evaluate flow at rest and with distal augmentation maneuvers. COMPARISON:  None. FINDINGS: RIGHT UPPER EXTREMITY Internal Jugular Vein: No evidence of thrombus. Normal compressibility, respiratory phasicity and response to augmentation. Subclavian Vein: No evidence of thrombus. Normal compressibility, respiratory phasicity and response to augmentation. Axillary Vein: No evidence of thrombus. Normal compressibility, respiratory phasicity and response to augmentation. Cephalic Vein: No evidence of thrombus. Normal compressibility, respiratory phasicity and response to augmentation. Basilic Vein: No evidence of thrombus. Normal compressibility, respiratory phasicity and response to augmentation. Brachial Veins: No evidence of thrombus. Normal compressibility, respiratory phasicity and response to augmentation. Radial Veins: No evidence of thrombus. Normal compressibility, respiratory phasicity and response to augmentation. Ulnar Veins: No  evidence of thrombus. Normal compressibility, respiratory phasicity and response to augmentation. Venous Reflux:  None. Other Findings:  None. LEFT UPPER EXTREMITY Internal Jugular Vein: No evidence of thrombus. Normal compressibility, respiratory phasicity and response to augmentation. Subclavian Vein: No evidence of thrombus. Normal compressibility, respiratory phasicity and response to augmentation. Axillary Vein: No evidence of thrombus. Normal compressibility, respiratory phasicity and  response to augmentation. Cephalic Vein: No evidence of thrombus. Normal compressibility, respiratory phasicity and response to augmentation. Basilic Vein: No evidence of thrombus. Normal compressibility, respiratory phasicity and response to augmentation. Brachial Veins: No evidence of thrombus. Normal compressibility, respiratory phasicity and response to augmentation. Radial Veins: No evidence of thrombus. Normal compressibility, respiratory phasicity and response to augmentation. Ulnar Veins: No evidence of thrombus. Normal compressibility, respiratory phasicity and response to augmentation. Venous Reflux:  None. Other Findings: The dialysis graft/fistula within the left forearm appears patent where imaged. IMPRESSION: 1. No evidence of DVT within either upper extremity. 2. The dialysis graft/fistula within left forearm appears patent where imaged. Electronically Signed   By: Sandi Mariscal M.D.   On: 01/25/2017 17:02    I have reviewed the patient's current medications.  Assessment/Plan: Problem #1 sepsis: Patient is on antibiotics. She is afebrile but her white blood cell count is high Problem #2 end-stage renal disease:She is status post hemodialysis yesterday. Her potassium is slightly low Problem #3 anemia: Her hemoglobin has declined and presently slightly lower than our target goal. Patient is on Epogen. Problem #4 fluid management: Patient denies any difficulty breathing. status post 2 L fluid removal  on. Problem #5 metabolic bone disease: Calcium is in range but her phosphorus is high. Presently patient is on no binder but unable to take more regular basis because of nausea and vomiting. Patient also at times he not able to eat. Problem # 6 sleep apnea: Not on CPAP Problem #7 morbid obesity Problem #8 diabetes: Her blood sugar is slightly high today. Problem #9 elevated LFTs: Possibly ischemic. Show slight improvement.  Plan : 1] patient doesn't need dialysis today  2]  We'll make arrangements for patient to get dialysis tomorrow  3]We'll use 2K/2.5 calcium bath 4]We will remove about 5-4/6 L if systolic blood pressure remains above 90 5]We'll check her CBC, phosphorus and renal panel in the morning.    LOS: 9 days   Emanual Lamountain S 01/27/2017,10:24 AM

## 2017-01-27 NOTE — Progress Notes (Signed)
[facility] for heparin and coumadin Indication: cor pulmonale, r/o PE  Allergies  Allergen Reactions  . Contrast Media [Iodinated Diagnostic Agents] Anaphylaxis, Hives, Swelling and Other (See Comments)    Dye for cardiac cath. Tongue swells  . Pneumococcal Vaccines Swelling    Turns skin black, and bodily swelling  . Shellfish Allergy Itching, Swelling and Other (See Comments)    Facial swelling - Pt able to eat now 11/25/16  . Vancomycin Nausea And Vomiting and Other (See Comments)    Infusion "made me feel like I was dying" had to be readmitted to hospital   Patient Measurements: Height: _0  (165.1 cm) Weight: (!) 327 lb 9.7 oz (148.6 kg) IBW/kg (Calculated) : 57 Heparin Dosing Weight: 93 kg  Vital Signs: Temp: 98.9 F (37.2 C) (02/08 1100) Temp Source: Oral (02/08 1100) BP: 117/67 (02/08 0800) Pulse Rate: 79 (02/08 0800)  Labs:  Recent Labs  01/25/17 0449 01/26/17 0430 01/26/17 1942 01/27/17 0457 01/27/17 1257  HGB 10.4* 10.3*  --  9.6*  --   HCT 29.8* 29.5*  --  27.7*  --   PLT 213 220  --  196  --   LABPROT 18.2* 18.7*  --  22.9*  --   INR 1.49 1.55  --  1.99  --   HEPARINUNFRC 0.65 1.03* >2.20* 1.38* 0.85*  CREATININE 4.80* 5.69*  --  4.34*  --    Estimated Creatinine Clearance: 22.9 mL/min (by C-G formula based on SCr of 4.34 mg/dL (H)).  Medical History: Past Medical History:  Diagnosis Date  . Anxiety   . Asthma    as a child  . Cancer ([place])    thyroid  . Cellulitis   . CHF (congestive heart failure) ([place])   . Chiari malformation    s/p surgery  . Chiari malformation   . Chronic pain   . Complication of anesthesia 11/28/15   Resp arrest after  conscious  sedation  . Constipation   . Coronary artery disease    40-50% mid LAD 04/29/09, Medical tx. (Dr. Gwenlyn Found)  . Diabetes mellitus    Type II  . Fibromyalgia   . History of blood transfusion    hemorrage duinrg pregancy  . Hx of echocardiogram 10/2011    EF 55-60%  . Hypercholesterolemia   . Hypertension   . Lymph edema   . Obesity hypoventilation syndrome ([place])   . On home O2   . Pneumonia    in past  . Renal disorder    Pt started dialysis in Dec.2016  . S/P colonoscopy 05/26/2007   Dr. Laural Golden sigmoid diverticulosis random biopsies benign  . S/P endoscopy 05/01/2009   Dr. Penelope Coop pill-induced esophageal ulcerations distal to midesophagus, 2 small ulcers in the antrum of the stomach  . Shortness of breath dyspnea    with any exertion or if heart rate  is irregular while on dialysis  . Sleep apnea    Medications:  Prescriptions Prior to Admission  Medication Sig Dispense Refill Last Dose  . acetaminophen (TYLENOL) 325 MG tablet Take 650 mg by mouth every 6 (six) hours as needed for moderate pain or fever.    01/17/2017 at Unknown time  . albuterol (PROVENTIL) (2.5 MG/3ML) 0.083% nebulizer solution Take 2.5 mg by nebulization every 6 (six) hours as needed for wheezing or shortness of breath.   Past Month at Unknown time  . ALPRAZolam (XANAX) 0.5 MG tablet Take 0.5 mg by mouth 3 (three) times  daily as needed for anxiety or sleep.    01/17/2017 at Unknown time  . amLODipine (NORVASC) 10 MG tablet Take 0.5 tablets (5 mg total) by mouth daily. 15 tablet 12 01/17/2017 at Unknown time  . cinacalcet (SENSIPAR) 30 MG tablet Take 30 mg by mouth daily.   01/17/2017 at Unknown time  . diphenhydrAMINE (BENADRYL) 25 mg capsule Take 25 mg by mouth every 6 (six) hours as needed for allergies.   unknown  . Insulin Glargine (LANTUS SOLOSTAR) 100 UNIT/ML Solostar Pen Inject 10 Units into the skin daily at 10 pm. 15 mL 11 01/17/2017 at Unknown time  . metoprolol (LOPRESSOR) 50 MG tablet Take 0.5 tablets (25 mg total) by mouth 2 (two) times daily. 30 tablet 12 01/17/2017 at Unknown time  . multivitamin (RENA-VIT) TABS tablet Take 1 tablet by mouth daily.   01/17/2017 at Unknown time  . Oxycodone HCl 10 MG TABS Take 1 tablet (10 mg total) by mouth 3 (three) times  daily as needed (pain). (Patient taking differently: Take 10 mg by mouth 4 (four) times daily as needed (pain). ) 15 tablet 0 unknown  . sevelamer carbonate (RENVELA) 800 MG tablet Take 1,600-3,200 mg by mouth See admin instructions. Take 3200 mg by mouth 3 times daily with meals and take 1600 mg by mouth with snacks.   01/17/2017 at Unknown time  . doxycycline (VIBRAMYCIN) 50 MG capsule Take 2 capsules (100 mg total) by mouth 2 (two) times daily. (Patient not taking: Reported on 01/18/2017) 20 capsule 0 Not Taking at Unknown time  . predniSONE (STERAPRED UNI-PAK 21 TAB) 10 MG (21) TBPK tablet Take by package instructions (Patient not taking: Reported on 01/18/2017) 21 tablet 0 Not Taking at Unknown time   Assessment: 51 yo lady to start heparin for cor pulmonale, r/o PE. Ventilation/perfusion lung scan is reported as very low probability for PE.  Coumadin has been resumed.  INR today is trending up, 1.99 today.  No bleeding noted.  Her Heparin level today is SUPRAtherapeutic on 1700 units/hr but trending down to therapeutic range.    Goal of Therapy:  Heparin level 0.3-0.7 units/ml Monitor platelets by anticoagulation protocol: Yes  INR 2-3   Plan:  Coumadin 69m today x 1 Reduce Heparin infusion to 1400 units/hr Daily INR, HL and CBC Monitor for bleeding complications  Thanks for allowing pharmacy to be a part of this patient's care.  SHart Robinsons PharmD Clinical Pharmacist Pager:  [phone]/07/2017   01/27/2017,2:22 PM

## 2017-01-27 NOTE — Progress Notes (Signed)
Subjective: She is still confused. Still has elevated white blood cell count and is not really clear what that is from. She's not had any fever. She had been complaining of constipation but now has diarrhea with lactulose. Her leg is still somewhat painful. Still having nausea and vomiting periodically. She underwent dialysis yesterday. Liver function is improving.  Objective: Vital signs in last 24 hours: Temp:  [97.6 F (36.4 C)-98.1 F (36.7 C)] 98.1 F (36.7 C) (02/08 0400) Pulse Rate:  [65-79] 72 (02/07 1900) Resp:  [13-20] 15 (02/07 1900) BP: (79-141)/(55-119) 99/86 (02/07 1900) SpO2:  [94 %-100 %] 94 % (02/07 1900) Weight:  [148.6 kg (327 lb 9.7 oz)-153.1 kg (337 lb 8.4 oz)] 148.6 kg (327 lb 9.7 oz) (02/08 0500) Weight change: 0 kg (0 lb) Last BM Date: 01/26/17  Intake/Output from previous day: 02/07 0701 - 02/08 0700 In: 870.6 [P.O.:60; I.V.:710.6; IV Piggyback:100] Out: 2000   PHYSICAL EXAM General appearance: alert, cooperative and Mildly confused Resp: clear to auscultation bilaterally Cardio: regular rate and rhythm, S1, S2 normal, no murmur, click, rub or gallop GI: soft, non-tender; bowel sounds normal; no masses,  no organomegaly Extremities: She has bilateral lymphedema. Her left leg is no longer warm and tender Skin warm and dry. Mucous membranes moist  Lab Results:  Results for orders placed or performed during the hospital encounter of 01/18/17 (from the past 48 hour(s))  Glucose, capillary     Status: Abnormal   Collection Time: 01/25/17  7:22 AM  Result Value Ref Range   Glucose-Capillary 214 (H) 65 - 99 mg/dL  Glucose, capillary     Status: Abnormal   Collection Time: 01/25/17  1:03 PM  Result Value Ref Range   Glucose-Capillary 247 (H) 65 - 99 mg/dL  Glucose, capillary     Status: Abnormal   Collection Time: 01/25/17  4:55 PM  Result Value Ref Range   Glucose-Capillary 272 (H) 65 - 99 mg/dL  Glucose, capillary     Status: Abnormal   Collection  Time: 01/25/17  9:32 PM  Result Value Ref Range   Glucose-Capillary 268 (H) 65 - 99 mg/dL  CBC     Status: Abnormal   Collection Time: 01/26/17  4:30 AM  Result Value Ref Range   WBC 22.9 (H) 4.0 - 10.5 K/uL   RBC 3.17 (L) 3.87 - 5.11 MIL/uL   Hemoglobin 10.3 (L) 12.0 - 15.0 g/dL   HCT 29.5 (L) 36.0 - 46.0 %   MCV 93.1 78.0 - 100.0 fL   MCH 32.5 26.0 - 34.0 pg   MCHC 34.9 30.0 - 36.0 g/dL   RDW 14.6 11.5 - 15.5 %   Platelets 220 150 - 400 K/uL  Heparin level (unfractionated)     Status: Abnormal   Collection Time: 01/26/17  4:30 AM  Result Value Ref Range   Heparin Unfractionated 1.03 (H) 0.30 - 0.70 IU/mL    Comment:        IF HEPARIN RESULTS ARE BELOW EXPECTED VALUES, AND PATIENT DOSAGE HAS BEEN CONFIRMED, SUGGEST FOLLOW UP TESTING OF ANTITHROMBIN III LEVELS.   Comprehensive metabolic panel     Status: Abnormal   Collection Time: 01/26/17  4:30 AM  Result Value Ref Range   Sodium 130 (L) 135 - 145 mmol/L   Potassium 4.6 3.5 - 5.1 mmol/L   Chloride 91 (L) 101 - 111 mmol/L   CO2 23 22 - 32 mmol/L   Glucose, Bld 275 (H) 65 - 99 mg/dL   BUN 79 (H)  6 - 20 mg/dL   Creatinine, Ser 5.69 (H) 0.44 - 1.00 mg/dL   Calcium 8.6 (L) 8.9 - 10.3 mg/dL   Total Protein 6.3 (L) 6.5 - 8.1 g/dL   Albumin 2.5 (L) 3.5 - 5.0 g/dL   AST 102 (H) 15 - 41 U/L   ALT 939 (H) 14 - 54 U/L   Alkaline Phosphatase 232 (H) 38 - 126 U/L   Total Bilirubin 1.3 (H) 0.3 - 1.2 mg/dL   GFR calc non Af Amer 8 (L) >60 mL/min   GFR calc Af Amer 9 (L) >60 mL/min    Comment: (NOTE) The eGFR has been calculated using the CKD EPI equation. This calculation has not been validated in all clinical situations. eGFR's persistently <60 mL/min signify possible Chronic Kidney Disease.    Anion gap 16 (H) 5 - 15  Protime-INR     Status: Abnormal   Collection Time: 01/26/17  4:30 AM  Result Value Ref Range   Prothrombin Time 18.7 (H) 11.4 - 15.2 seconds   INR 1.55   Glucose, capillary     Status: Abnormal    Collection Time: 01/26/17  8:17 AM  Result Value Ref Range   Glucose-Capillary 232 (H) 65 - 99 mg/dL  Glucose, capillary     Status: Abnormal   Collection Time: 01/26/17 12:07 PM  Result Value Ref Range   Glucose-Capillary 174 (H) 65 - 99 mg/dL  Glucose, capillary     Status: Abnormal   Collection Time: 01/26/17  4:20 PM  Result Value Ref Range   Glucose-Capillary 163 (H) 65 - 99 mg/dL   Comment 1 Notify RN    Comment 2 Document in Chart   Heparin level (unfractionated)     Status: Abnormal   Collection Time: 01/26/17  7:42 PM  Result Value Ref Range   Heparin Unfractionated >2.20 (H) 0.30 - 0.70 IU/mL    Comment: RESULTS CONFIRMED BY MANUAL DILUTION        IF HEPARIN RESULTS ARE BELOW EXPECTED VALUES, AND PATIENT DOSAGE HAS BEEN CONFIRMED, SUGGEST FOLLOW UP TESTING OF ANTITHROMBIN III LEVELS.   Glucose, capillary     Status: Abnormal   Collection Time: 01/26/17  9:18 PM  Result Value Ref Range   Glucose-Capillary 157 (H) 65 - 99 mg/dL   Comment 1 Notify RN    Comment 2 Document in Chart   CBC     Status: Abnormal   Collection Time: 01/27/17  4:57 AM  Result Value Ref Range   WBC 22.6 (H) 4.0 - 10.5 K/uL   RBC 2.94 (L) 3.87 - 5.11 MIL/uL   Hemoglobin 9.6 (L) 12.0 - 15.0 g/dL   HCT 27.7 (L) 36.0 - 46.0 %   MCV 94.2 78.0 - 100.0 fL   MCH 32.7 26.0 - 34.0 pg   MCHC 34.7 30.0 - 36.0 g/dL   RDW 14.8 11.5 - 15.5 %   Platelets 196 150 - 400 K/uL  Heparin level (unfractionated)     Status: Abnormal   Collection Time: 01/27/17  4:57 AM  Result Value Ref Range   Heparin Unfractionated 1.38 (H) 0.30 - 0.70 IU/mL    Comment: RESULT REPEATED AND VERIFIED CRITICAL RESULT CALLED TO, READ BACK BY AND VERIFIED WITH: SMITH,J AT 2119 BY HUFFINES,S ON 01/27/17.        IF HEPARIN RESULTS ARE BELOW EXPECTED VALUES, AND PATIENT DOSAGE HAS BEEN CONFIRMED, SUGGEST FOLLOW UP TESTING OF ANTITHROMBIN III LEVELS.   Comprehensive metabolic panel     Status: Abnormal  Collection Time:  01/27/17  4:57 AM  Result Value Ref Range   Sodium 134 (L) 135 - 145 mmol/L   Potassium 3.4 (L) 3.5 - 5.1 mmol/L    Comment: DELTA CHECK NOTED   Chloride 95 (L) 101 - 111 mmol/L   CO2 26 22 - 32 mmol/L   Glucose, Bld 236 (H) 65 - 99 mg/dL   BUN 50 (H) 6 - 20 mg/dL   Creatinine, Ser 4.34 (H) 0.44 - 1.00 mg/dL   Calcium 8.2 (L) 8.9 - 10.3 mg/dL   Total Protein 5.8 (L) 6.5 - 8.1 g/dL   Albumin 2.2 (L) 3.5 - 5.0 g/dL   AST 47 (H) 15 - 41 U/L   ALT 570 (H) 14 - 54 U/L   Alkaline Phosphatase 186 (H) 38 - 126 U/L   Total Bilirubin 1.1 0.3 - 1.2 mg/dL   GFR calc non Af Amer 11 (L) >60 mL/min   GFR calc Af Amer 13 (L) >60 mL/min    Comment: (NOTE) The eGFR has been calculated using the CKD EPI equation. This calculation has not been validated in all clinical situations. eGFR's persistently <60 mL/min signify possible Chronic Kidney Disease.    Anion gap 13 5 - 15  Protime-INR     Status: Abnormal   Collection Time: 01/27/17  4:57 AM  Result Value Ref Range   Prothrombin Time 22.9 (H) 11.4 - 15.2 seconds   INR 1.99     ABGS  Recent Labs  01/24/17 1140  PHART 7.290*  PO2ART 85  HCO3 19.0*   CULTURES Recent Results (from the past 240 hour(s))  Culture, blood (routine x 2)     Status: Abnormal   Collection Time: 01/18/17  3:41 PM  Result Value Ref Range Status   Specimen Description LEFT ANTECUBITAL  Final   Special Requests BOTTLES DRAWN AEROBIC AND ANAEROBIC 6CC EACH  Final   Culture  Setup Time   Final    GRAM NEGATIVE RODS AEROBIC AND ANAEROBIC BOTTLES Gram Stain Report Called to,Read Back By and Verified With: HOWARD,C AT 0725 BY HUFFINES,S ON 01/19/17    Culture (A)  Final    SERRATIA MARCESCENS SUSCEPTIBILITIES PERFORMED ON PREVIOUS CULTURE WITHIN THE LAST 5 DAYS. Performed at [facility], [place] [address], [place], [place] [address]    Report Status 01/21/2017 FINAL  Final  Culture, blood (routine x 2)     Status: Abnormal   Collection Time: 01/18/17  3:41  PM  Result Value Ref Range Status   Specimen Description BLOOD LEFT HAND  Final   Special Requests BOTTLES DRAWN AEROBIC AND ANAEROBIC [facility] EACH  Final   Culture  Setup Time   Final    GRAM NEGATIVE RODS AEROBIC AND ANAEROBIC BOTTLES Gram Stain Report Called to,Read Back By and Verified With: HOWARD,C AT 7846 BY HUFFINES,S ON 01/19/17. CRITICAL RESULT CALLED TO, READ BACK BY AND VERIFIED WITHJuel Burrow PHARMD 1535 01/19/17 A BROWNING Performed at [facility], [place] [address] [place], [place] [address]    Culture SERRATIA MARCESCENS (A)  Final   Report Status 01/21/2017 FINAL  Final   Organism ID, Bacteria SERRATIA MARCESCENS  Final      Susceptibility   Serratia marcescens - MIC*    CEFAZOLIN >=64 RESISTANT Resistant     CEFEPIME <=1 SENSITIVE Sensitive     CEFTAZIDIME <=1 SENSITIVE Sensitive     CEFTRIAXONE <=1 SENSITIVE Sensitive     CIPROFLOXACIN <=0.25 SENSITIVE Sensitive  GENTAMICIN <=1 SENSITIVE Sensitive     TRIMETH/SULFA <=20 SENSITIVE Sensitive     * SERRATIA MARCESCENS  Blood Culture ID Panel (Reflexed)     Status: Abnormal   Collection Time: 01/18/17  3:41 PM  Result Value Ref Range Status   Enterococcus species NOT DETECTED NOT DETECTED Final   Listeria monocytogenes NOT DETECTED NOT DETECTED Final   Staphylococcus species NOT DETECTED NOT DETECTED Final   Staphylococcus aureus NOT DETECTED NOT DETECTED Final   Streptococcus species NOT DETECTED NOT DETECTED Final   Streptococcus agalactiae NOT DETECTED NOT DETECTED Final   Streptococcus pneumoniae NOT DETECTED NOT DETECTED Final   Streptococcus pyogenes NOT DETECTED NOT DETECTED Final   Acinetobacter baumannii NOT DETECTED NOT DETECTED Final   Enterobacteriaceae species DETECTED (A) NOT DETECTED Final    Comment: Enterobacteriaceae represent a large family of gram-negative bacteria, not a single organism. CRITICAL RESULT CALLED TO, READ BACK BY AND VERIFIED WITH: S HALL PHARMD 1535 01/19/17 A BROWNING     Enterobacter cloacae complex NOT DETECTED NOT DETECTED Final   Escherichia coli NOT DETECTED NOT DETECTED Final   Klebsiella oxytoca NOT DETECTED NOT DETECTED Final   Klebsiella pneumoniae NOT DETECTED NOT DETECTED Final   Proteus species NOT DETECTED NOT DETECTED Final   Serratia marcescens DETECTED (A) NOT DETECTED Final    Comment: CRITICAL RESULT CALLED TO, READ BACK BY AND VERIFIED WITH: S HALL PHARMD 1535 01/19/17 A BROWNING    Carbapenem resistance NOT DETECTED NOT DETECTED Final   Haemophilus influenzae NOT DETECTED NOT DETECTED Final   Neisseria meningitidis NOT DETECTED NOT DETECTED Final   Pseudomonas aeruginosa NOT DETECTED NOT DETECTED Final   Candida albicans NOT DETECTED NOT DETECTED Final   Candida glabrata NOT DETECTED NOT DETECTED Final   Candida krusei NOT DETECTED NOT DETECTED Final   Candida parapsilosis NOT DETECTED NOT DETECTED Final   Candida tropicalis NOT DETECTED NOT DETECTED Final    Comment: Performed at [facility], [place] [address], [place], [place] [address]  MRSA PCR Screening     Status: None   Collection Time: 01/18/17 10:25 PM  Result Value Ref Range Status   MRSA by PCR NEGATIVE NEGATIVE Final    Comment:        The GeneXpert MRSA Assay (FDA approved for NASAL specimens only), is one component of a comprehensive MRSA colonization surveillance program. It is not intended to diagnose MRSA infection nor to guide or monitor treatment for MRSA infections.    Studies/Results: Dg Chest 1 View  Result Date: 01/25/2017 CLINICAL DATA:  For comparison with VQ scan EXAM: CHEST 1 VIEW COMPARISON:  01/20/2017 FINDINGS: Cardiomegaly with vascular congestion. Mild interstitial prominence again noted could reflect interstitial edema. No confluent opacities or effusions. No acute bony abnormality. Right internal jugular PICC line remains in place, unchanged. IMPRESSION: Cardiomegaly with vascular congestion and interstitial prominence, likely mild  interstitial edema. Electronically Signed   By: Rolm Baptise M.D.   On: 01/25/2017 12:52   Nm Pulmonary Perf And Vent  Result Date: 01/25/2017 CLINICAL DATA:  Cor pulmonale EXAM: NUCLEAR MEDICINE VENTILATION - PERFUSION LUNG SCAN TECHNIQUE: Ventilation images were obtained in multiple projections using inhaled aerosol Tc-56mDTPA. Perfusion images were obtained in multiple projections after intravenous injection of Tc-945mAA. Patient unable to remove arms from imaging field in order to perform lateral views. RADIOPHARMACEUTICALS:  3.85 mCi Technetium-995mPA aerosol inhalation and 30 mCi Technetium-67m65m IV COMPARISON:  None Correlation: Chest radiograph 01/25/2017 FINDINGS: Ventilation: Central airway deposition  of tracer. Mild diminished ventilation in LEFT lower lobe. Enlargement of cardiac silhouette. No other segmental or subsegmental ventilatory defects. Perfusion: Matching diminished perfusion in LEFT lower lobe. Enlargement of cardiac silhouette. No additional segmental or subsegmental perfusion defects. Chest radiograph: Enlargement of cardiac silhouette with pulmonary vascular congestion and probable interstitial edema IMPRESSION: Matching diminished ventilation and perfusion in the LEFT lower lobe. Enlargement of cardiac silhouette. Very low probability for pulmonary embolism. Electronically Signed   By: Lavonia Dana M.D.   On: 01/25/2017 13:04   US Venous Img Lower Bilateral  Result Date: 01/25/2017 CLINICAL DATA:  Bilateral lower extremity pain and edema. History of chronic lymphedema and varicose veins. Evaluate for DVT. EXAM: BILATERAL LOWER EXTREMITY VENOUS DOPPLER ULTRASOUND TECHNIQUE: Gray-scale sonography with graded compression, as well as color Doppler and duplex ultrasound were performed to evaluate the lower extremity deep venous systems from the level of the common femoral vein and including the common femoral, femoral, profunda femoral, popliteal and calf veins including the  posterior tibial, peroneal and gastrocnemius veins when visible. The superficial great saphenous vein was also interrogated. Spectral Doppler was utilized to evaluate flow at rest and with distal augmentation maneuvers in the common femoral, femoral and popliteal veins. COMPARISON:  None. FINDINGS: Examination is degraded due to patient body habitus and poor sonographic window. RIGHT LOWER EXTREMITY Common Femoral Vein: No evidence of thrombus. Normal compressibility, respiratory phasicity and response to augmentation. Saphenofemoral Junction: No evidence of thrombus. Normal compressibility and flow on color Doppler imaging. Profunda Femoral Vein: No evidence of thrombus. Normal compressibility and flow on color Doppler imaging. Femoral Vein: No evidence of thrombus. Normal compressibility, respiratory phasicity and response to augmentation. Popliteal Vein: No evidence of thrombus. Normal compressibility, respiratory phasicity and response to augmentation. Calf Veins: No evidence of thrombus. Normal compressibility and flow on color Doppler imaging. Superficial Great Saphenous Vein: No evidence of thrombus. Normal compressibility and flow on color Doppler imaging. Venous Reflux:  None. Other Findings:  None. LEFT LOWER EXTREMITY Common Femoral Vein: No evidence of thrombus. Normal compressibility, respiratory phasicity and response to augmentation. Saphenofemoral Junction: No evidence of thrombus. Normal compressibility and flow on color Doppler imaging. Profunda Femoral Vein: No evidence of thrombus. Normal compressibility and flow on color Doppler imaging. Femoral Vein: No evidence of thrombus. Normal compressibility, respiratory phasicity and response to augmentation. Popliteal Vein: No evidence of thrombus. Normal compressibility, respiratory phasicity and response to augmentation. Calf Veins: No evidence of thrombus. Normal compressibility and flow on color Doppler imaging. Superficial Great Saphenous Vein: No  evidence of thrombus. Normal compressibility and flow on color Doppler imaging. Venous Reflux:  None. Other Findings:  None. IMPRESSION: Body habitus degraded examination without definitive evidence of DVT within either lower extremity. Electronically Signed   By: Sandi Mariscal M.D.   On: 01/25/2017 16:38   US Venous Img Upper Bilat  Result Date: 01/25/2017 CLINICAL DATA:  Cor pulmonale. History of CHF and morbid obesity. History of right jugular vein approach PICC line and dialysis access within the left forearm. Evaluate for DVT. EXAM: BILATERAL UPPER EXTREMITY VENOUS DOPPLER ULTRASOUND TECHNIQUE: Gray-scale sonography with graded compression, as well as color Doppler and duplex ultrasound were performed to evaluate the bilateral upper extremity deep venous systems from the level of the subclavian vein and including the jugular, axillary, basilic, radial, ulnar and upper cephalic vein. Spectral Doppler was utilized to evaluate flow at rest and with distal augmentation maneuvers. COMPARISON:  None. FINDINGS: RIGHT UPPER EXTREMITY Internal Jugular Vein: No evidence of thrombus.  Normal compressibility, respiratory phasicity and response to augmentation. Subclavian Vein: No evidence of thrombus. Normal compressibility, respiratory phasicity and response to augmentation. Axillary Vein: No evidence of thrombus. Normal compressibility, respiratory phasicity and response to augmentation. Cephalic Vein: No evidence of thrombus. Normal compressibility, respiratory phasicity and response to augmentation. Basilic Vein: No evidence of thrombus. Normal compressibility, respiratory phasicity and response to augmentation. Brachial Veins: No evidence of thrombus. Normal compressibility, respiratory phasicity and response to augmentation. Radial Veins: No evidence of thrombus. Normal compressibility, respiratory phasicity and response to augmentation. Ulnar Veins: No evidence of thrombus. Normal compressibility, respiratory  phasicity and response to augmentation. Venous Reflux:  None. Other Findings:  None. LEFT UPPER EXTREMITY Internal Jugular Vein: No evidence of thrombus. Normal compressibility, respiratory phasicity and response to augmentation. Subclavian Vein: No evidence of thrombus. Normal compressibility, respiratory phasicity and response to augmentation. Axillary Vein: No evidence of thrombus. Normal compressibility, respiratory phasicity and response to augmentation. Cephalic Vein: No evidence of thrombus. Normal compressibility, respiratory phasicity and response to augmentation. Basilic Vein: No evidence of thrombus. Normal compressibility, respiratory phasicity and response to augmentation. Brachial Veins: No evidence of thrombus. Normal compressibility, respiratory phasicity and response to augmentation. Radial Veins: No evidence of thrombus. Normal compressibility, respiratory phasicity and response to augmentation. Ulnar Veins: No evidence of thrombus. Normal compressibility, respiratory phasicity and response to augmentation. Venous Reflux:  None. Other Findings: The dialysis graft/fistula within the left forearm appears patent where imaged. IMPRESSION: 1. No evidence of DVT within either upper extremity. 2. The dialysis graft/fistula within left forearm appears patent where imaged. Electronically Signed   By: Sandi Mariscal M.D.   On: 01/25/2017 17:02    Medications:  Prior to Admission:  Prescriptions Prior to Admission  Medication Sig Dispense Refill Last Dose  . acetaminophen (TYLENOL) 325 MG tablet Take 650 mg by mouth every 6 (six) hours as needed for moderate pain or fever.    01/17/2017 at Unknown time  . albuterol (PROVENTIL) (2.5 MG/3ML) 0.083% nebulizer solution Take 2.5 mg by nebulization every 6 (six) hours as needed for wheezing or shortness of breath.   Past Month at Unknown time  . ALPRAZolam (XANAX) 0.5 MG tablet Take 0.5 mg by mouth 3 (three) times daily as needed for anxiety or sleep.     01/17/2017 at Unknown time  . amLODipine (NORVASC) 10 MG tablet Take 0.5 tablets (5 mg total) by mouth daily. 15 tablet 12 01/17/2017 at Unknown time  . cinacalcet (SENSIPAR) 30 MG tablet Take 30 mg by mouth daily.   01/17/2017 at Unknown time  . diphenhydrAMINE (BENADRYL) 25 mg capsule Take 25 mg by mouth every 6 (six) hours as needed for allergies.   unknown  . Insulin Glargine (LANTUS SOLOSTAR) 100 UNIT/ML Solostar Pen Inject 10 Units into the skin daily at 10 pm. 15 mL 11 01/17/2017 at Unknown time  . metoprolol (LOPRESSOR) 50 MG tablet Take 0.5 tablets (25 mg total) by mouth 2 (two) times daily. 30 tablet 12 01/17/2017 at Unknown time  . multivitamin (RENA-VIT) TABS tablet Take 1 tablet by mouth daily.   01/17/2017 at Unknown time  . Oxycodone HCl 10 MG TABS Take 1 tablet (10 mg total) by mouth 3 (three) times daily as needed (pain). (Patient taking differently: Take 10 mg by mouth 4 (four) times daily as needed (pain). ) 15 tablet 0 unknown  . sevelamer carbonate (RENVELA) 800 MG tablet Take 1,600-3,200 mg by mouth See admin instructions. Take 3200 mg by mouth 3 times daily with meals  and take 1600 mg by mouth with snacks.   01/17/2017 at Unknown time  . doxycycline (VIBRAMYCIN) 50 MG capsule Take 2 capsules (100 mg total) by mouth 2 (two) times daily. (Patient not taking: Reported on 01/18/2017) 20 capsule 0 Not Taking at Unknown time  . predniSONE (STERAPRED UNI-PAK 21 TAB) 10 MG (21) TBPK tablet Take by package instructions (Patient not taking: Reported on 01/18/2017) 21 tablet 0 Not Taking at Unknown time   Scheduled: . ceFEPime (MAXIPIME) IV  2 g Intravenous Q M,W,F-HD  . Chlorhexidine Gluconate Cloth  6 each Topical Daily  . epoetin (EPOGEN/PROCRIT) injection  8,000 Units Intravenous Q M,W,F-HD  . insulin aspart  0-9 Units Subcutaneous TID WC  . mouth rinse  15 mL Mouth Rinse BID  . ondansetron (ZOFRAN) IV  8 mg Intravenous Q6H  . sodium chloride flush  10-40 mL Intracatheter Q12H  . Warfarin  - Pharmacist Dosing Inpatient   Does not apply q1800   Continuous: . heparin 2,000 Units/hr (01/27/17 0000)  . phenylephrine ([name]-SYNEPHRINE) Adult infusion Stopped (01/26/17 1300)   PKG:YBNLWH chloride, sodium chloride, acetaminophen, albuterol, ALPRAZolam, alteplase, guaiFENesin-dextromethorphan, heparin, levalbuterol, ondansetron **OR** ondansetron (ZOFRAN) IV, oxyCODONE, sodium chloride flush, technetium tetrofosmin, technetium tetrofosmin  Assesment: She was admitted with cellulitis of her left leg. This is in the leg that has had previous episodes of cellulitis and she has bilateral severe lymphedema at baseline. She will had sepsis with septic shock. She has required pressor supports. Is off pressors now. She has been heparinized because of significant right heart failure. She is high risk for clotting although she does not demonstrate clots now. After discussion with cardiology plans are to continue anticoagulation indefinitely.  She had elevated troponin thought to be related to demand ischemia \ She has nausea and has been vomiting I'm not sure if this is related to her liver issue but she also had what appeared to be cholecystitis about a year ago but was not felt to be a surgical candidate at that time.  She has chronic diastolic heart failure managed with dialysis.  Chronic lymphedema of both legs and cellulitis on the left which is better  End-stage renal disease on dialysis which is ongoing. She dialyzed yesterday  Metabolic encephalopathy multifactorial  She has obesity hypoventilation which may be part of the reason for her pulmonary hypertension and right heart failure  She had constipation that has resolved  She had shock liver and that is improving  She still has elevated white blood cell count which had come down and I'm going to go ahead and get blood culture and urine culture to be sure that were not missing some problem especially considering that she's got  dialysis access and had gram-negative bacteremia Principal Problem:   Fever Active Problems:   Diabetes mellitus ([place])   Morbid obesity ([place])   Obesity hypoventilation syndrome ([facility])   Recurrent cellulitis of lower leg   Chronic diastolic CHF (congestive heart failure) ([facility])   Chronic respiratory failure with hypoxia ([facility])   ESRD on dialysis ([place])   Flu-like symptoms   Elevated troponin   Septic shock ([facility])   Shock liver   Cor pulmonale, chronic ([place])    Plan: Blood cultures 2. Urine culture. Ultrasound of the abdomen. Continue with other treatments. Change her to round the clock Zofran to try to help with the nausea.    LOS: 9 days   Lulabelle Desta L 01/27/2017, 7:07 AM

## 2017-01-28 LAB — GLUCOSE, CAPILLARY
GLUCOSE-CAPILLARY: 136 mg/dL — AB (ref 65–99)
Glucose-Capillary: 218 mg/dL — ABNORMAL HIGH (ref 65–99)
Glucose-Capillary: 324 mg/dL — ABNORMAL HIGH (ref 65–99)
Glucose-Capillary: 140 mg/dL — ABNORMAL HIGH (ref 65–99)

## 2017-01-28 LAB — PROTIME-INR
INR: 2.07
PROTHROMBIN TIME: 23.6 s — AB (ref 11.4–15.2)

## 2017-01-28 LAB — RENAL FUNCTION PANEL
ALBUMIN: 2.3 g/dL — AB (ref 3.5–5.0)
Anion gap: 10 (ref 5–15)
BUN: 59 mg/dL — AB (ref 6–20)
CO2: 27 mmol/L (ref 22–32)
Calcium: 7.8 mg/dL — ABNORMAL LOW (ref 8.9–10.3)
Chloride: 95 mmol/L — ABNORMAL LOW (ref 101–111)
Creatinine, Ser: 5.25 mg/dL — ABNORMAL HIGH (ref 0.44–1.00)
GFR calc Af Amer: 10 mL/min — ABNORMAL LOW (ref >60)
GFR calc non Af Amer: 9 mL/min — ABNORMAL LOW (ref >60)
GLUCOSE: 229 mg/dL — AB (ref 65–99)
PHOSPHORUS: 6 mg/dL — AB (ref 2.5–4.6)
POTASSIUM: 3.5 mmol/L (ref 3.5–5.1)
Sodium: 132 mmol/L — ABNORMAL LOW (ref 135–145)

## 2017-01-28 LAB — CBC
HCT: 27.4 % — ABNORMAL LOW (ref 36.0–46.0)
HEMOGLOBIN: 9.4 g/dL — AB (ref 12.0–15.0)
MCH: 32.5 pg (ref 26.0–34.0)
MCHC: 34.3 g/dL (ref 30.0–36.0)
MCV: 94.8 fL (ref 78.0–100.0)
Platelets: 190 10*3/uL (ref 150–400)
RBC: 2.89 MIL/uL — AB (ref 3.87–5.11)
RDW: 15.3 % (ref 11.5–15.5)
WBC: 20 10*3/uL — ABNORMAL HIGH (ref 4.0–10.5)

## 2017-01-28 LAB — HEPARIN LEVEL (UNFRACTIONATED): HEPARIN UNFRACTIONATED: 0.15 [IU]/mL — AB (ref 0.30–0.70)

## 2017-01-28 MED ORDER — EPOETIN ALFA 10000 UNIT/ML IJ SOLN
INTRAMUSCULAR | Status: AC
  Administered 2017-01-28: 8000 [IU]
  Filled 2017-01-28: qty 1

## 2017-01-28 MED ORDER — WARFARIN SODIUM 5 MG PO TABS
5.0000 mg | ORAL_TABLET | Freq: Once | ORAL | Status: AC
  Administered 2017-01-28: 5 mg via ORAL
  Filled 2017-01-28: qty 1

## 2017-01-28 NOTE — Progress Notes (Signed)
Subjective: She says she feels better. She was able to sit up at bedside yesterday. She has less nausea. Her leg feels better. Less confused. No vomiting through the night last night. No chest pain. She does have some heartburn. No other new complaints.  Objective: Vital signs in last 24 hours: Temp:  [97.7 F (36.5 C)-98.9 F (37.2 C)] 97.7 F (36.5 C) (02/09 0400) Pulse Rate:  [79] 79 (02/08 0800) Resp:  [14] 14 (02/08 0800) BP: (117)/(67) 117/67 (02/08 0800) SpO2:  [97 %] 97 % (02/08 0800) Weight:  [148 kg (326 lb 4.5 oz)] 148 kg (326 lb 4.5 oz) (02/09 0500) Weight change: -5.1 kg (-11 lb 3.9 oz) Last BM Date: 01/27/17  Intake/Output from previous day: 02/08 0701 - 02/09 0700 In: 680.7 [P.O.:360; I.V.:320.7] Out: 450 [Emesis/NG output:450]  PHYSICAL EXAM General appearance: alert, cooperative, mild distress, morbidly obese and Significantly less confused Resp: clear to auscultation bilaterally Cardio: regular rate and rhythm, S1, S2 normal, no murmur, click, rub or gallop GI: soft, non-tender; bowel sounds normal; no masses,  no organomegaly Extremities: She has chronic lymphedema of both legs. The cellulitis of her left leg is essentially gone Skin warm and dry. Mucous membranes are moist  Lab Results:  Results for orders placed or performed during the hospital encounter of 01/18/17 (from the past 48 hour(s))  Glucose, capillary     Status: Abnormal   Collection Time: 01/26/17  8:17 AM  Result Value Ref Range   Glucose-Capillary 232 (H) 65 - 99 mg/dL  Glucose, capillary     Status: Abnormal   Collection Time: 01/26/17 12:07 PM  Result Value Ref Range   Glucose-Capillary 174 (H) 65 - 99 mg/dL  Glucose, capillary     Status: Abnormal   Collection Time: 01/26/17  4:20 PM  Result Value Ref Range   Glucose-Capillary 163 (H) 65 - 99 mg/dL   Comment 1 Notify RN    Comment 2 Document in Chart   Heparin level (unfractionated)     Status: Abnormal   Collection Time: 01/26/17   7:42 PM  Result Value Ref Range   Heparin Unfractionated >2.20 (H) 0.30 - 0.70 IU/mL    Comment: RESULTS CONFIRMED BY MANUAL DILUTION        IF HEPARIN RESULTS ARE BELOW EXPECTED VALUES, AND PATIENT DOSAGE HAS BEEN CONFIRMED, SUGGEST FOLLOW UP TESTING OF ANTITHROMBIN III LEVELS.   Glucose, capillary     Status: Abnormal   Collection Time: 01/26/17  9:18 PM  Result Value Ref Range   Glucose-Capillary 157 (H) 65 - 99 mg/dL   Comment 1 Notify RN    Comment 2 Document in Chart   CBC     Status: Abnormal   Collection Time: 01/27/17  4:57 AM  Result Value Ref Range   WBC 22.6 (H) 4.0 - 10.5 K/uL   RBC 2.94 (L) 3.87 - 5.11 MIL/uL   Hemoglobin 9.6 (L) 12.0 - 15.0 g/dL   HCT 27.7 (L) 36.0 - 46.0 %   MCV 94.2 78.0 - 100.0 fL   MCH 32.7 26.0 - 34.0 pg   MCHC 34.7 30.0 - 36.0 g/dL   RDW 14.8 11.5 - 15.5 %   Platelets 196 150 - 400 K/uL  Heparin level (unfractionated)     Status: Abnormal   Collection Time: 01/27/17  4:57 AM  Result Value Ref Range   Heparin Unfractionated 1.38 (H) 0.30 - 0.70 IU/mL    Comment: RESULT REPEATED AND VERIFIED CRITICAL RESULT CALLED TO, READ BACK BY  AND VERIFIED WITH: SMITH,J AT 7829 BY HUFFINES,S ON 01/27/17.        IF HEPARIN RESULTS ARE BELOW EXPECTED VALUES, AND PATIENT DOSAGE HAS BEEN CONFIRMED, SUGGEST FOLLOW UP TESTING OF ANTITHROMBIN III LEVELS.   Comprehensive metabolic panel     Status: Abnormal   Collection Time: 01/27/17  4:57 AM  Result Value Ref Range   Sodium 134 (L) 135 - 145 mmol/L   Potassium 3.4 (L) 3.5 - 5.1 mmol/L    Comment: DELTA CHECK NOTED   Chloride 95 (L) 101 - 111 mmol/L   CO2 26 22 - 32 mmol/L   Glucose, Bld 236 (H) 65 - 99 mg/dL   BUN 50 (H) 6 - 20 mg/dL   Creatinine, Ser 4.34 (H) 0.44 - 1.00 mg/dL   Calcium 8.2 (L) 8.9 - 10.3 mg/dL   Total Protein 5.8 (L) 6.5 - 8.1 g/dL   Albumin 2.2 (L) 3.5 - 5.0 g/dL   AST 47 (H) 15 - 41 U/L   ALT 570 (H) 14 - 54 U/L   Alkaline Phosphatase 186 (H) 38 - 126 U/L   Total  Bilirubin 1.1 0.3 - 1.2 mg/dL   GFR calc non Af Amer 11 (L) >60 mL/min   GFR calc Af Amer 13 (L) >60 mL/min    Comment: (NOTE) The eGFR has been calculated using the CKD EPI equation. This calculation has not been validated in all clinical situations. eGFR's persistently <60 mL/min signify possible Chronic Kidney Disease.    Anion gap 13 5 - 15  Protime-INR     Status: Abnormal   Collection Time: 01/27/17  4:57 AM  Result Value Ref Range   Prothrombin Time 22.9 (H) 11.4 - 15.2 seconds   INR 1.99   Culture, blood (Routine X 2) w Reflex to ID Panel     Status: None (Preliminary result)   Collection Time: 01/27/17  7:41 AM  Result Value Ref Range   Specimen Description BLOOD RIGHT ARM    Special Requests      BOTTLES DRAWN AEROBIC AND ANAEROBIC  8 CC EACH BOTTLE   Culture PENDING    Report Status PENDING   Culture, blood (Routine X 2) w Reflex to ID Panel     Status: None (Preliminary result)   Collection Time: 01/27/17  7:50 AM  Result Value Ref Range   Specimen Description BLOOD CENTRAL LINE    Special Requests BOTTLES DRAWN AEROBIC ONLY 8 CC    Culture PENDING    Report Status PENDING   Glucose, capillary     Status: Abnormal   Collection Time: 01/27/17  8:05 AM  Result Value Ref Range   Glucose-Capillary 173 (H) 65 - 99 mg/dL  Glucose, capillary     Status: Abnormal   Collection Time: 01/27/17 11:11 AM  Result Value Ref Range   Glucose-Capillary 174 (H) 65 - 99 mg/dL  Heparin level (unfractionated)     Status: Abnormal   Collection Time: 01/27/17 12:57 PM  Result Value Ref Range   Heparin Unfractionated 0.85 (H) 0.30 - 0.70 IU/mL    Comment:        IF HEPARIN RESULTS ARE BELOW EXPECTED VALUES, AND PATIENT DOSAGE HAS BEEN CONFIRMED, SUGGEST FOLLOW UP TESTING OF ANTITHROMBIN III LEVELS.   Glucose, capillary     Status: Abnormal   Collection Time: 01/27/17  4:56 PM  Result Value Ref Range   Glucose-Capillary 181 (H) 65 - 99 mg/dL   Comment 1 Notify RN    Comment 2  Document in Chart   Glucose, capillary     Status: Abnormal   Collection Time: 01/27/17  9:34 PM  Result Value Ref Range   Glucose-Capillary 210 (H) 65 - 99 mg/dL   Comment 1 Notify RN   CBC     Status: Abnormal   Collection Time: 01/28/17  4:57 AM  Result Value Ref Range   WBC 20.0 (H) 4.0 - 10.5 K/uL   RBC 2.89 (L) 3.87 - 5.11 MIL/uL   Hemoglobin 9.4 (L) 12.0 - 15.0 g/dL   HCT 27.4 (L) 36.0 - 46.0 %   MCV 94.8 78.0 - 100.0 fL   MCH 32.5 26.0 - 34.0 pg   MCHC 34.3 30.0 - 36.0 g/dL   RDW 15.3 11.5 - 15.5 %   Platelets 190 150 - 400 K/uL  Heparin level (unfractionated)     Status: Abnormal   Collection Time: 01/28/17  4:57 AM  Result Value Ref Range   Heparin Unfractionated 0.15 (L) 0.30 - 0.70 IU/mL    Comment:        IF HEPARIN RESULTS ARE BELOW EXPECTED VALUES, AND PATIENT DOSAGE HAS BEEN CONFIRMED, SUGGEST FOLLOW UP TESTING OF ANTITHROMBIN III LEVELS.   Protime-INR     Status: Abnormal   Collection Time: 01/28/17  4:57 AM  Result Value Ref Range   Prothrombin Time 23.6 (H) 11.4 - 15.2 seconds   INR 2.07   Renal function panel     Status: Abnormal   Collection Time: 01/28/17  4:57 AM  Result Value Ref Range   Sodium 132 (L) 135 - 145 mmol/L   Potassium 3.5 3.5 - 5.1 mmol/L   Chloride 95 (L) 101 - 111 mmol/L   CO2 27 22 - 32 mmol/L   Glucose, Bld 229 (H) 65 - 99 mg/dL   BUN 59 (H) 6 - 20 mg/dL   Creatinine, Ser 5.25 (H) 0.44 - 1.00 mg/dL   Calcium 7.8 (L) 8.9 - 10.3 mg/dL   Phosphorus 6.0 (H) 2.5 - 4.6 mg/dL   Albumin 2.3 (L) 3.5 - 5.0 g/dL   GFR calc non Af Amer 9 (L) >60 mL/min   GFR calc Af Amer 10 (L) >60 mL/min    Comment: (NOTE) The eGFR has been calculated using the CKD EPI equation. This calculation has not been validated in all clinical situations. eGFR's persistently <60 mL/min signify possible Chronic Kidney Disease.    Anion gap 10 5 - 15    ABGS No results for input(s): PHART, PO2ART, TCO2, HCO3 in the last 72 hours.  Invalid input(s):  PCO2 CULTURES Recent Results (from the past 240 hour(s))  Culture, blood (routine x 2)     Status: Abnormal   Collection Time: 01/18/17  3:41 PM  Result Value Ref Range Status   Specimen Description LEFT ANTECUBITAL  Final   Special Requests BOTTLES DRAWN AEROBIC AND ANAEROBIC 6CC EACH  Final   Culture  Setup Time   Final    GRAM NEGATIVE RODS AEROBIC AND ANAEROBIC BOTTLES Gram Stain Report Called to,Read Back By and Verified With: HOWARD,C AT 0725 BY HUFFINES,S ON 01/19/17    Culture (A)  Final    SERRATIA MARCESCENS SUSCEPTIBILITIES PERFORMED ON PREVIOUS CULTURE WITHIN THE LAST 5 DAYS. Performed at [facility], [place] [address], [place], [place] [address]    Report Status 01/21/2017 FINAL  Final  Culture, blood (routine x 2)     Status: Abnormal   Collection Time: 01/18/17  3:41 PM  Result Value Ref Range  Status   Specimen Description BLOOD LEFT HAND  Final   Special Requests BOTTLES DRAWN AEROBIC AND ANAEROBIC [facility] EACH  Final   Culture  Setup Time   Final    GRAM NEGATIVE RODS AEROBIC AND ANAEROBIC BOTTLES Gram Stain Report Called to,Read Back By and Verified With: HOWARD,C AT 8768 BY HUFFINES,S ON 01/19/17. CRITICAL RESULT CALLED TO, READ BACK BY AND VERIFIED WITHJuel Burrow PHARMD 1535 01/19/17 A BROWNING Performed at [facility], [place] [address] [place], [place] [address]    Culture SERRATIA MARCESCENS (A)  Final   Report Status 01/21/2017 FINAL  Final   Organism ID, Bacteria SERRATIA MARCESCENS  Final      Susceptibility   Serratia marcescens - MIC*    CEFAZOLIN >=64 RESISTANT Resistant     CEFEPIME <=1 SENSITIVE Sensitive     CEFTAZIDIME <=1 SENSITIVE Sensitive     CEFTRIAXONE <=1 SENSITIVE Sensitive     CIPROFLOXACIN <=0.25 SENSITIVE Sensitive     GENTAMICIN <=1 SENSITIVE Sensitive     TRIMETH/SULFA <=20 SENSITIVE Sensitive     * SERRATIA MARCESCENS  Blood Culture ID Panel (Reflexed)     Status: Abnormal   Collection Time: 01/18/17  3:41 PM  Result Value  Ref Range Status   Enterococcus species NOT DETECTED NOT DETECTED Final   Listeria monocytogenes NOT DETECTED NOT DETECTED Final   Staphylococcus species NOT DETECTED NOT DETECTED Final   Staphylococcus aureus NOT DETECTED NOT DETECTED Final   Streptococcus species NOT DETECTED NOT DETECTED Final   Streptococcus agalactiae NOT DETECTED NOT DETECTED Final   Streptococcus pneumoniae NOT DETECTED NOT DETECTED Final   Streptococcus pyogenes NOT DETECTED NOT DETECTED Final   Acinetobacter baumannii NOT DETECTED NOT DETECTED Final   Enterobacteriaceae species DETECTED (A) NOT DETECTED Final    Comment: Enterobacteriaceae represent a large family of gram-negative bacteria, not a single organism. CRITICAL RESULT CALLED TO, READ BACK BY AND VERIFIED WITH: S HALL PHARMD 1535 01/19/17 A BROWNING    Enterobacter cloacae complex NOT DETECTED NOT DETECTED Final   Escherichia coli NOT DETECTED NOT DETECTED Final   Klebsiella oxytoca NOT DETECTED NOT DETECTED Final   Klebsiella pneumoniae NOT DETECTED NOT DETECTED Final   Proteus species NOT DETECTED NOT DETECTED Final   Serratia marcescens DETECTED (A) NOT DETECTED Final    Comment: CRITICAL RESULT CALLED TO, READ BACK BY AND VERIFIED WITH: S HALL PHARMD 1535 01/19/17 A BROWNING    Carbapenem resistance NOT DETECTED NOT DETECTED Final   Haemophilus influenzae NOT DETECTED NOT DETECTED Final   Neisseria meningitidis NOT DETECTED NOT DETECTED Final   Pseudomonas aeruginosa NOT DETECTED NOT DETECTED Final   Candida albicans NOT DETECTED NOT DETECTED Final   Candida glabrata NOT DETECTED NOT DETECTED Final   Candida krusei NOT DETECTED NOT DETECTED Final   Candida parapsilosis NOT DETECTED NOT DETECTED Final   Candida tropicalis NOT DETECTED NOT DETECTED Final    Comment: Performed at [facility], [place] [address] [place], [place] [address]  MRSA PCR Screening     Status: None   Collection Time: 01/18/17 10:25 PM  Result Value Ref Range Status    MRSA by PCR NEGATIVE NEGATIVE Final    Comment:        The GeneXpert MRSA Assay (FDA approved for NASAL specimens only), is one component of a comprehensive MRSA colonization surveillance program. It is not intended to diagnose MRSA infection nor to guide or monitor treatment for MRSA infections.   Culture, blood (Routine  X 2) w Reflex to ID Panel     Status: None (Preliminary result)   Collection Time: 01/27/17  7:41 AM  Result Value Ref Range Status   Specimen Description BLOOD RIGHT ARM  Final   Special Requests   Final    BOTTLES DRAWN AEROBIC AND ANAEROBIC  8 CC EACH BOTTLE   Culture PENDING  Incomplete   Report Status PENDING  Incomplete  Culture, blood (Routine X 2) w Reflex to ID Panel     Status: None (Preliminary result)   Collection Time: 01/27/17  7:50 AM  Result Value Ref Range Status   Specimen Description BLOOD CENTRAL LINE  Final   Special Requests BOTTLES DRAWN AEROBIC ONLY 8 CC  Final   Culture PENDING  Incomplete   Report Status PENDING  Incomplete   Studies/Results: US Abdomen Complete  Result Date: 01/27/2017 CLINICAL DATA:  Nausea and vomiting with increasing severity over the past 2 days. History of morbid obesity, end-stage renal disease, diabetes, cholecystitis. EXAM: ABDOMEN ULTRASOUND COMPLETE COMPARISON:  Abdominopelvic CT scan of August 12, 2016. FINDINGS: Gallbladder: The gallbladder is contracted. There is gallbladder wall thickening accentuated by the contracted state. No stones are evident. There is no positive sonographic Murphy's sign. Common bile duct: Diameter: 3.9 mm. Liver: The hepatic echotexture is slightly heterogeneous. There is no discrete mass or ductal dilation. IVC: No abnormality visualized. Pancreas: Visualized portion unremarkable. Spleen: Normal where visualized. Right Kidney: Length: 12.4 cm. Echogenicity within normal limits. No mass or hydronephrosis visualized. Left Kidney: Length: 11.7 cm. Visualization of the renal parenchyma  was somewhat limited due to bowel gas and the patient's body habitus. There is no hydronephrosis. Abdominal aorta: No aneurysm visualized. Other findings: No ascites is observed. IMPRESSION: The study is limited due to bowel gas and the patient's body habitus. The gallbladder is contracted. No definite stones are observed. No positive sonographic Murphy's sign. If chronic cholecystitis is suspected clinically, a nuclear medicine hepatobiliary scan with gallbladder ejection fraction determination may be useful. Probable mild fatty infiltrative change of the liver. No acute intra-abdominal abnormality is observed otherwise. Electronically Signed   By: David  Martinique M.D.   On: 01/27/2017 13:25    Medications:  Prior to Admission:  Prescriptions Prior to Admission  Medication Sig Dispense Refill Last Dose  . acetaminophen (TYLENOL) 325 MG tablet Take 650 mg by mouth every 6 (six) hours as needed for moderate pain or fever.    01/17/2017 at Unknown time  . albuterol (PROVENTIL) (2.5 MG/3ML) 0.083% nebulizer solution Take 2.5 mg by nebulization every 6 (six) hours as needed for wheezing or shortness of breath.   Past Month at Unknown time  . ALPRAZolam (XANAX) 0.5 MG tablet Take 0.5 mg by mouth 3 (three) times daily as needed for anxiety or sleep.    01/17/2017 at Unknown time  . amLODipine (NORVASC) 10 MG tablet Take 0.5 tablets (5 mg total) by mouth daily. 15 tablet 12 01/17/2017 at Unknown time  . cinacalcet (SENSIPAR) 30 MG tablet Take 30 mg by mouth daily.   01/17/2017 at Unknown time  . diphenhydrAMINE (BENADRYL) 25 mg capsule Take 25 mg by mouth every 6 (six) hours as needed for allergies.   unknown  . Insulin Glargine (LANTUS SOLOSTAR) 100 UNIT/ML Solostar Pen Inject 10 Units into the skin daily at 10 pm. 15 mL 11 01/17/2017 at Unknown time  . metoprolol (LOPRESSOR) 50 MG tablet Take 0.5 tablets (25 mg total) by mouth 2 (two) times daily. 30 tablet 12 01/17/2017 at  Unknown time  . multivitamin (RENA-VIT)  TABS tablet Take 1 tablet by mouth daily.   01/17/2017 at Unknown time  . Oxycodone HCl 10 MG TABS Take 1 tablet (10 mg total) by mouth 3 (three) times daily as needed (pain). (Patient taking differently: Take 10 mg by mouth 4 (four) times daily as needed (pain). ) 15 tablet 0 unknown  . sevelamer carbonate (RENVELA) 800 MG tablet Take 1,600-3,200 mg by mouth See admin instructions. Take 3200 mg by mouth 3 times daily with meals and take 1600 mg by mouth with snacks.   01/17/2017 at Unknown time  . doxycycline (VIBRAMYCIN) 50 MG capsule Take 2 capsules (100 mg total) by mouth 2 (two) times daily. (Patient not taking: Reported on 01/18/2017) 20 capsule 0 Not Taking at Unknown time  . predniSONE (STERAPRED UNI-PAK 21 TAB) 10 MG (21) TBPK tablet Take by package instructions (Patient not taking: Reported on 01/18/2017) 21 tablet 0 Not Taking at Unknown time   Scheduled: . ceFEPime (MAXIPIME) IV  2 g Intravenous Q M,W,F-HD  . Chlorhexidine Gluconate Cloth  6 each Topical Daily  . epoetin (EPOGEN/PROCRIT) injection  8,000 Units Intravenous Q M,W,F-HD  . insulin aspart  0-9 Units Subcutaneous TID WC  . mouth rinse  15 mL Mouth Rinse BID  . ondansetron (ZOFRAN) IV  8 mg Intravenous Q6H  . sodium chloride flush  10-40 mL Intracatheter Q12H  . Warfarin - Pharmacist Dosing Inpatient   Does not apply q1800   Continuous: . heparin 1,400 Units/hr (01/28/17 0313)  . phenylephrine ([name]-SYNEPHRINE) Adult infusion Stopped (01/26/17 1300)   HSF:JFJKNI chloride, sodium chloride, acetaminophen, albuterol, ALPRAZolam, alteplase, guaiFENesin-dextromethorphan, heparin, levalbuterol, ondansetron **OR** ondansetron (ZOFRAN) IV, oxyCODONE, sodium chloride flush, technetium tetrofosmin, technetium tetrofosmin  Assesment: She was admitted with cellulitis of the leg and septic shock. She has Serratia in the blood. She is on cefepime. The cellulitis is a recurrent problem. She is clearly much better.  She has end-stage renal  disease on dialysis.  She has shock liver and her liver function is improving.  Her troponin has been elevated but it does not appear that she's had an acute coronary syndrome.  She's had significant nausea and I was concerned about her gallbladder. Ultrasound yesterday showed the gallbladder is contracted but did not look like she has acute cholecystitis.  She has diabetes which is stable.  She has obesity hypoventilation syndrome at baseline.  She has right heart failure which is chronic. She is going to be anticoagulated because she is high risk for clots.  She has had metabolic encephalopathy and that is clearing. I think is multifactorial. Principal Problem:   Fever Active Problems:   Diabetes mellitus ([place])   Morbid obesity ([place])   Obesity hypoventilation syndrome (HCC)   Recurrent cellulitis of lower leg   Chronic diastolic CHF (congestive heart failure) (HCC)   Chronic respiratory failure with hypoxia (HCC)   ESRD on dialysis (HCC)   Flu-like symptoms   Elevated troponin   Septic shock (HCC)   Shock liver   Cor pulmonale, chronic ([place])    Plan: Continue treatments. She is improving now. For dialysis today. Recheck C met tomorrow to see what's happening with her liver function.    LOS: 10 days   Kandi Brusseau L 01/28/2017, 6:51 AM

## 2017-01-28 NOTE — Progress Notes (Addendum)
[place] for heparin and coumadin Indication: cor pulmonale, r/o PE  Allergies  Allergen Reactions  . Contrast Media [Iodinated Diagnostic Agents] Anaphylaxis, Hives, Swelling and Other (See Comments)    Dye for cardiac cath. Tongue swells  . Pneumococcal Vaccines Swelling    Turns skin black, and bodily swelling  . Shellfish Allergy Itching, Swelling and Other (See Comments)    Facial swelling - Pt able to eat now 11/25/16  . Vancomycin Nausea And Vomiting and Other (See Comments)    Infusion "made me feel like I was dying" had to be readmitted to hospital   Patient Measurements: Height: _0  (165.1 cm) Weight: (!) 326 lb 4.5 oz (148 kg) IBW/kg (Calculated) : 57 Heparin Dosing Weight: 93 kg  Vital Signs: Temp: 97.7 F (36.5 C) (02/09 0400) Temp Source: Oral (02/09 0400)  Labs:  Recent Labs  01/26/17 0430  01/27/17 0457 01/27/17 1257 01/28/17 0457  HGB 10.3*  --  9.6*  --  9.4*  HCT 29.5*  --  27.7*  --  27.4*  PLT 220  --  196  --  190  LABPROT 18.7*  --  22.9*  --  23.6*  INR 1.55  --  1.99  --  2.07  HEPARINUNFRC 1.03*  < > 1.38* 0.85* 0.15*  CREATININE 5.69*  --  4.34*  --  5.25*  < > = values in this interval not displayed. Estimated Creatinine Clearance: 18.9 mL/min (by C-G formula based on SCr of 5.25 mg/dL (H)).  Medical History: Past Medical History:  Diagnosis Date  . Anxiety   . Asthma    as a child  . Cancer ([place])    thyroid  . Cellulitis   . CHF (congestive heart failure) ([place])   . Chiari malformation    s/p surgery  . Chiari malformation   . Chronic pain   . Complication of anesthesia 11/28/15   Resp arrest after  conscious  sedation  . Constipation   . Coronary artery disease    40-50% mid LAD 04/29/09, Medical tx. (Dr. Gwenlyn Found)  . Diabetes mellitus    Type II  . Fibromyalgia   . History of blood transfusion    hemorrage duinrg pregancy  . Hx of echocardiogram 10/2011   EF 55-60%  .  Hypercholesterolemia   . Hypertension   . Lymph edema   . Obesity hypoventilation syndrome ([place])   . On home O2   . Pneumonia    in past  . Renal disorder    Pt started dialysis in Dec.2016  . S/P colonoscopy 05/26/2007   Dr. Laural Golden sigmoid diverticulosis random biopsies benign  . S/P endoscopy 05/01/2009   Dr. Penelope Coop pill-induced esophageal ulcerations distal to midesophagus, 2 small ulcers in the antrum of the stomach  . Shortness of breath dyspnea    with any exertion or if heart rate  is irregular while on dialysis  . Sleep apnea    Medications:  Prescriptions Prior to Admission  Medication Sig Dispense Refill Last Dose  . acetaminophen (TYLENOL) 325 MG tablet Take 650 mg by mouth every 6 (six) hours as needed for moderate pain or fever.    01/17/2017 at Unknown time  . albuterol (PROVENTIL) (2.5 MG/3ML) 0.083% nebulizer solution Take 2.5 mg by nebulization every 6 (six) hours as needed for wheezing or shortness of breath.   Past Month at Unknown time  . ALPRAZolam (XANAX) 0.5 MG tablet Take 0.5 mg by mouth 3 (three) times  daily as needed for anxiety or sleep.    01/17/2017 at Unknown time  . amLODipine (NORVASC) 10 MG tablet Take 0.5 tablets (5 mg total) by mouth daily. 15 tablet 12 01/17/2017 at Unknown time  . cinacalcet (SENSIPAR) 30 MG tablet Take 30 mg by mouth daily.   01/17/2017 at Unknown time  . diphenhydrAMINE (BENADRYL) 25 mg capsule Take 25 mg by mouth every 6 (six) hours as needed for allergies.   unknown  . Insulin Glargine (LANTUS SOLOSTAR) 100 UNIT/ML Solostar Pen Inject 10 Units into the skin daily at 10 pm. 15 mL 11 01/17/2017 at Unknown time  . metoprolol (LOPRESSOR) 50 MG tablet Take 0.5 tablets (25 mg total) by mouth 2 (two) times daily. 30 tablet 12 01/17/2017 at Unknown time  . multivitamin (RENA-VIT) TABS tablet Take 1 tablet by mouth daily.   01/17/2017 at Unknown time  . Oxycodone HCl 10 MG TABS Take 1 tablet (10 mg total) by mouth 3 (three) times daily as needed  (pain). (Patient taking differently: Take 10 mg by mouth 4 (four) times daily as needed (pain). ) 15 tablet 0 unknown  . sevelamer carbonate (RENVELA) 800 MG tablet Take 1,600-3,200 mg by mouth See admin instructions. Take 3200 mg by mouth 3 times daily with meals and take 1600 mg by mouth with snacks.   01/17/2017 at Unknown time  . doxycycline (VIBRAMYCIN) 50 MG capsule Take 2 capsules (100 mg total) by mouth 2 (two) times daily. (Patient not taking: Reported on 01/18/2017) 20 capsule 0 Not Taking at Unknown time  . predniSONE (STERAPRED UNI-PAK 21 TAB) 10 MG (21) TBPK tablet Take by package instructions (Patient not taking: Reported on 01/18/2017) 21 tablet 0 Not Taking at Unknown time   Assessment: 51 yo lady to start heparin for cor pulmonale, r/o PE. Ventilation/perfusion lung scan is reported as very low probability for PE.  Coumadin has been resumed.  INR 1.99 yesterday and therapeutic at 2.07 today. No bleeding noted. Discussed wih MD, will d/c heparin and continue with Coumadin.  Goal of Therapy:  INR 2-3 Monitor platelets by anticoagulation protocol: Yes     Plan:  D/C heparin Coumadin 48m today x 1 Daily Pt-INR Monitor for bleeding complications  Thanks for allowing pharmacy to be a part of this patient's care.  LIsac Sarna BS PVena [place] BCPS Clinical Pharmacist Pager #[phone]/08/2017,8:50 AM

## 2017-01-28 NOTE — Progress Notes (Signed)
Inpatient Diabetes Program Recommendations  AACE/ADA: New Consensus Statement on Inpatient Glycemic Control (2015)  Target Ranges:  Prepandial:   less than 140 mg/dL      Peak postprandial:   less than 180 mg/dL (1-2 hours)      Critically ill patients:  140 - 180 mg/dL   Results for [name], [name] (MRN [id number]) as of 01/28/2017 11:49  Ref. Range 01/27/2017 11:11 01/27/2017 16:56 01/27/2017 21:34 01/28/2017 07:16 01/28/2017 11:27  Glucose-Capillary Latest Ref Range: 65 - 99 mg/dL 174 (H) 181 (H) 210 (H) 218 (H) 324 (H)   Review of Glycemic Control Home meds: Lantus 10 units QHS  Inpatient Diabetes Program Recommendations:   Glucose trend is increasing. Patient received Novolog correction 3 hours after 218 glucose this am. Lunchtime glucose elevated at 324.   Please consider restarting a portion of home basal insulin. Lantus 8 units Q 24hours.  Thanks,  Tama Headings RN, MSN, [facility] Inpatient Diabetes Coordinator Team Pager [phone] (8a-5p)

## 2017-01-28 NOTE — Procedures (Signed)
HEMODIALYSIS TREATMENT NOTE:  4 hour heparin-free dialysis completed via left forearm AVF. Goal met: 2.5 liters removed without interruption in ultrafiltration. All blood was returned and hemostasis was achieved within 15 minutes. Cefepime given IV at end of HD. Report given to Loni Muse, RN.  Rockwell Alexandria, RN, CDN

## 2017-01-28 NOTE — Progress Notes (Signed)
Subjective: Interval History: Patient is feeling much better. She has still some nausea but no vomiting. Patient at this moment denies any difficulty breathing.  Objective: Vital signs in last 24 hours: Temp:  [97.7 F (36.5 C)-98.9 F (37.2 C)] 97.7 F (36.5 C) (02/09 0400) Weight:  [148 kg (326 lb 4.5 oz)] 148 kg (326 lb 4.5 oz) (02/09 0500) Weight change: -5.1 kg (-11 lb 3.9 oz)  Intake/Output from previous day: 02/08 0701 - 02/09 0700 In: 680.7 [P.O.:360; I.V.:320.7] Out: 450 [Emesis/NG output:450] Intake/Output this shift: No intake/output data recorded.  General appearance: alert, cooperative and no distress Resp: diminished breath sounds anterior - bilateral and wheezes bilaterally Cardio: regular rate and rhythm Extremities: edema Patient with venous stasis and chronic leg edema  Lab Results:  Recent Labs  01/27/17 0457 01/28/17 0457  WBC 22.6* 20.0*  HGB 9.6* 9.4*  HCT 27.7* 27.4*  PLT 196 190   BMET:   Recent Labs  01/27/17 0457 01/28/17 0457  NA 134* 132*  K 3.4* 3.5  CL 95* 95*  CO2 26 27  GLUCOSE 236* 229*  BUN 50* 59*  CREATININE 4.34* 5.25*  CALCIUM 8.2* 7.8*   No results for input([name]): PTH in the last 72 hours. Iron Studies: No results for input([name]): IRON, TIBC, TRANSFERRIN, FERRITIN in the last 72 hours.  Studies/Results: US Abdomen Complete  Result Date: 01/27/2017 CLINICAL DATA:  Nausea and vomiting with increasing severity over the past 2 days. History of morbid obesity, end-stage renal disease, diabetes, cholecystitis. EXAM: ABDOMEN ULTRASOUND COMPLETE COMPARISON:  Abdominopelvic CT scan of August 12, 2016. FINDINGS: Gallbladder: The gallbladder is contracted. There is gallbladder wall thickening accentuated by the contracted state. No stones are evident. There is no positive sonographic Murphy'[name] sign. Common bile duct: Diameter: 3.9 mm. Liver: The hepatic echotexture is slightly heterogeneous. There is no discrete mass or ductal dilation.  IVC: No abnormality visualized. Pancreas: Visualized portion unremarkable. Spleen: Normal where visualized. Right Kidney: Length: 12.4 cm. Echogenicity within normal limits. No mass or hydronephrosis visualized. Left Kidney: Length: 11.7 cm. Visualization of the renal parenchyma was somewhat limited due to bowel gas and the patient'[name] body habitus. There is no hydronephrosis. Abdominal aorta: No aneurysm visualized. Other findings: No ascites is observed. IMPRESSION: The study is limited due to bowel gas and the patient'[name] body habitus. The gallbladder is contracted. No definite stones are observed. No positive sonographic Murphy'[name] sign. If chronic cholecystitis is suspected clinically, a nuclear medicine hepatobiliary scan with gallbladder ejection fraction determination may be useful. Probable mild fatty infiltrative change of the liver. No acute intra-abdominal abnormality is observed otherwise. Electronically Signed   By: David  Martinique M.D.   On: 01/27/2017 13:25    I have reviewed the patient'[name] current medications.  Assessment/Plan: Problem #1 sepsis: Patient continued to be on antibiotics. Presently seems to be improving. Problem #2 end-stage renal disease:She is status post hemodialysis on Wednesday. Patient is due for dialysis today. Problem #3 anemia: Her hemoglobin is below target goal. Patient is on Epogen. Her hemoglobin remains stable. Problem #4 fluid management: Patient denies any difficulty breathing and no orthopnea. Problem #5 metabolic bone disease: Calcium is in range but her phosphorus is higher than our target goal for seems to be better. Problem # 6 sleep apnea: Not on CPAP Problem #7 morbid obesity Problem #8 diabetes: Her blood sugar is slightly high today. Problem #9 elevated LFTs: Possibly ischemic. Progressively improving. Plan : 1]  We'll make arrangements for patient to get dialysis tomorrow  2]We'll  use 3K/2.5 calcium bath 3]We will remove about 4-1/9 L if systolic blood  pressure remains above 90    LOS: 10 days   [name] 01/28/2017,8:26 AM

## 2017-01-28 NOTE — Clinical Social Work Note (Signed)
Clinical Social Work Assessment  Patient Details  Name: [name] MRN: [id number] Date of Birth: 08/02/1966  Date of referral:  01/28/17               Reason for consult:  Discharge Planning                Permission sought to share information with:    Permission granted to share information::     Name::        Agency::     Relationship::     Contact Information:     Housing/Transportation Living arrangements for the past 2 months:  Single Family Home Source of Information:  Patient Patient Interpreter Needed:  None Criminal Activity/Legal Involvement Pertinent to Current Situation/Hospitalization:  No - Comment as needed Significant Relationships:  Adult Children, Spouse Lives with:  Adult Children, Spouse Do you feel safe going back to the place where you live?  Yes Need for family participation in patient care:  Yes (Comment)  Care giving concerns:  None identified.   Social Worker assessment / plan:  Patient lives with husband and adult son. She can ambulate short distances with a walker, however she uses a wheelchair often. She has been in bed since 01/21/17 due to her current illness. Patient completes ADLs unassisted. She has a supportive family. Patient goes to [facility] in [place] MWF 5:30 a.m.-11:00 a.m. Her son transports her to dialysis. Patient was agreeable to SNF and stated that she wanted to stay in [place].   Employment status:  Disabled (Comment on whether or not currently receiving Disability) Insurance information:  Medicare PT Recommendations:  [place] / Referral to community resources:  [place]  Patient/Family's Response to care:  Patient is agreeable to SNF.  Patient/Family's Understanding of and Emotional Response to Diagnosis, Current Treatment, and Prognosis:  Patient understands her diagnosis, treatment and prognosis.   Emotional Assessment Appearance:  Appears stated  age Attitude/Demeanor/Rapport:   (Cooperative/Pleasant) Affect (typically observed):  Accepting, Calm Orientation:  Oriented to Situation, Oriented to  Time, Oriented to Place, Oriented to Self Alcohol / Substance use:  Not Applicable Psych involvement (Current and /or in the community):     Discharge Needs  Concerns to be addressed:  Discharge Planning Concerns Readmission within the last 30 days:  No Current discharge risk:  None Barriers to Discharge:  No Barriers Identified   Ihor Gully, LCSW 01/28/2017, 10:42 AM

## 2017-01-28 NOTE — Progress Notes (Signed)
Progress Note  Patient Name: [name] Date of Encounter: 01/28/2017  Primary Cardiologist: Dr. Kate Sable  Subjective   Feels better today. No chest pain or palpitations.  Inpatient Medications    Scheduled Meds: . ceFEPime (MAXIPIME) IV  2 g Intravenous Q M,W,F-HD  . Chlorhexidine Gluconate Cloth  6 each Topical Daily  . epoetin (EPOGEN/PROCRIT) injection  8,000 Units Intravenous Q M,W,F-HD  . insulin aspart  0-9 Units Subcutaneous TID WC  . mouth rinse  15 mL Mouth Rinse BID  . ondansetron (ZOFRAN) IV  8 mg Intravenous Q6H  . sodium chloride flush  10-40 mL Intracatheter Q12H  . Warfarin - Pharmacist Dosing Inpatient   Does not apply q1800   Continuous Infusions: . heparin 1,400 Units/hr (01/28/17 0313)  . phenylephrine ([name]-SYNEPHRINE) Adult infusion Stopped (01/26/17 1300)   PRN Meds: sodium chloride, sodium chloride, acetaminophen, albuterol, ALPRAZolam, alteplase, guaiFENesin-dextromethorphan, heparin, levalbuterol, ondansetron **OR** ondansetron (ZOFRAN) IV, oxyCODONE, sodium chloride flush, technetium tetrofosmin, technetium tetrofosmin   Vital Signs    Vitals:   01/27/17 2000 01/28/17 0000 01/28/17 0400 01/28/17 0500  BP:      Pulse:      Resp:      Temp: 98 F (36.7 C) 98 F (36.7 C) 97.7 F (36.5 C)   TempSrc: Oral Oral Oral   SpO2:      Weight:    (!) 326 lb 4.5 oz (148 kg)  Height:        Intake/Output Summary (Last 24 hours) at 01/28/17 0811 Last data filed at 01/27/17 1404  Gross per 24 hour  Intake              360 ml  Output              450 ml  Net              -90 ml   Filed Weights   01/26/17 0845 01/27/17 0500 01/28/17 0500  Weight: (!) 337 lb 8.4 oz (153.1 kg) (!) 327 lb 9.7 oz (148.6 kg) (!) 326 lb 4.5 oz (148 kg)    Telemetry    I personally reviewed telemetry monitoring which shows sinus rhythm in lead tremor artifact.  ECG    I personally reviewed the tracing from 01/20/2017 which shows sinus rhythm with  inferior Q waves, rule out old inferior infarct pattern.  Physical Exam   GEN: Obese woman, no acute distress.   Neck:  Increased JVP. Cardiac: RRR, no gallops.  Respiratory: Clear to auscultation bilaterally. GI: Soft, nontender, non-distended  MS:  Significant lymphedema as before.  Labs    Chemistry  Recent Labs Lab 01/25/17 0449 01/26/17 0430 01/27/17 0457 01/28/17 0457  NA 132* 130* 134* 132*  K 4.6 4.6 3.4* 3.5  CL 93* 91* 95* 95*  CO2 _0 GLUCOSE 220* 275* 236* 229*  BUN 56* 79* 50* 59*  CREATININE 4.80* 5.69* 4.34* 5.25*  CALCIUM 9.0 8.6* 8.2* 7.8*  PROT 6.3* 6.3* 5.8*  --   ALBUMIN 2.5* 2.5* 2.2* 2.3*  AST 316* 102* 47*  --   ALT 1,342* 939* 570*  --   ALKPHOS 248* 232* 186*  --   BILITOT 2.0* 1.3* 1.1  --   GFRNONAA 10* 8* 11* 9*  GFRAA 11* 9* 13* 10*  ANIONGAP 14 16* 13 10     Hematology  Recent Labs Lab 01/26/17 0430 01/27/17 0457 01/28/17 0457  WBC 22.9* 22.6* 20.0*  RBC 3.17* 2.94* 2.89*  HGB 10.3* 9.6* 9.4*  HCT 29.5* 27.7* 27.4*  MCV 93.1 94.2 94.8  MCH 32.5 32.7 32.5  MCHC 34.9 34.7 34.3  RDW 14.6 14.8 15.3  PLT 220 196 190    DDimer   Recent Labs Lab 01/24/17 0419  DDIMER 7.87*     Radiology    US Abdomen Complete  Result Date: 01/27/2017 CLINICAL DATA:  Nausea and vomiting with increasing severity over the past 2 days. History of morbid obesity, end-stage renal disease, diabetes, cholecystitis. EXAM: ABDOMEN ULTRASOUND COMPLETE COMPARISON:  Abdominopelvic CT scan of August 12, 2016. FINDINGS: Gallbladder: The gallbladder is contracted. There is gallbladder wall thickening accentuated by the contracted state. No stones are evident. There is no positive sonographic Murphy's sign. Common bile duct: Diameter: 3.9 mm. Liver: The hepatic echotexture is slightly heterogeneous. There is no discrete mass or ductal dilation. IVC: No abnormality visualized. Pancreas: Visualized portion unremarkable. Spleen: Normal where visualized.  Right Kidney: Length: 12.4 cm. Echogenicity within normal limits. No mass or hydronephrosis visualized. Left Kidney: Length: 11.7 cm. Visualization of the renal parenchyma was somewhat limited due to bowel gas and the patient's body habitus. There is no hydronephrosis. Abdominal aorta: No aneurysm visualized. Other findings: No ascites is observed. IMPRESSION: The study is limited due to bowel gas and the patient's body habitus. The gallbladder is contracted. No definite stones are observed. No positive sonographic Murphy's sign. If chronic cholecystitis is suspected clinically, a nuclear medicine hepatobiliary scan with gallbladder ejection fraction determination may be useful. Probable mild fatty infiltrative change of the liver. No acute intra-abdominal abnormality is observed otherwise. Electronically Signed   By: David  Martinique M.D.   On: 01/27/2017 13:25    Cardiac Studies   Echocardiogram 01/21/2017: Study Conclusions  - Left ventricle: Septal flattening consistent with RV volume /   pressure overload. The cavity size was normal. Wall thickness was   increased in a pattern of mild LVH. Systolic function was   vigorous. The estimated ejection fraction was in the range of 65%   to 70%. Doppler parameters are consistent with abnormal left   ventricular relaxation (grade 1 diastolic dysfunction). - Right ventricle: The cavity size was severely dilated. Systolic   function was moderately reduced. - Right atrium: The atrium was moderately dilated. - Pulmonary arteries: PA peak pressure: 85 mm Hg (S). - Impressions: No significant change from report of June 2017.  Impressions:  - No significant change from report of June 2017.  Patient Profile     51 y.o. female with history of ESRD on hemodialysis, chronic lymphedema with cellulitis, OSA with chronic hypoxic respiratory failure, currently admitted with lower extremity discomfort and volume overload as well as fever and gram-negative  bacteremia. D-dimer elevated to 7.87 although VQ scan low probability for PE and upper as well as lower extremity venous Dopplers negative for DVT. She is being treated with Coumadin given remaining concern for thromboembolic disease, particularly in light of severe pulmonary hypertension with cor pulmonale. Cardiac enzymes are also elevated with troponin I up to 5.3. ECG shows inferior Q waves however echocardiogram reveals LVEF 65-70% without wall motion abnormalities. Demand ischemia has been suspected although NSTEMI not excluded. She is not reporting chest pain.  Assessment & Plan    1. Cor pulmonale with severe RV dysfunction and pulmonary hypertension, PASP 85 mmHg by echocardiogram. Although not proven by VQ scan or venous Dopplers, pulmonary thromboembolic disease has been a concern and Coumadin initiated. She does have a history of hypoventilation syndrome with chronic  hypoxic respiratory failure which could be contributing as well. INR 2.07 this morning.  2. Elevated troponin I up to 5.3. No active chest pain. Demand ischemia has been suspected based on chart review, NSTEMI not excluded. LVEF is vigorous by echocardiogram without wall motion abnormalities however.  3. ESRD on hemodialysis. For repeat session today per Nephrology.  4. Gram-negative bacteremia with sepsis. She is on broad-spectrum prior antibiotics per primary team.  5. Chronic lymphedema.  Patient on heparin bridging to Coumadin per pharmacy and INR up to 2.07 today. Volume status being managed by hemodialysis - she is to have a repeat session today. Do not anticipate ischemic workup at this time, can be considered at a later date.  Signed, Rozann Lesches, MD  01/28/2017, 8:11 AM

## 2017-01-28 NOTE — NC FL2 (Signed)
[place] LEVEL OF CARE SCREENING TOOL     IDENTIFICATION  Patient Name: [name] Birthdate: 1966/09/06 Sex: female Admission Date (Current Location): 01/18/2017  [facility] and [place] Number:  [employer] and Address:  [facility] [address] [place]      Provider Number: [id number]  Attending Physician Name and Address:  [name], MD  Relative Name and Phone Number:       Current Level of Care: Hospital Recommended Level of Care: [place] Prior Approval Number:    Date Approved/Denied:   PASRR Number: [id number]  Discharge Plan: SNF    Current Diagnoses: Patient Active Problem List   Diagnosis Date Noted  . Septic shock ([place]) 01/24/2017  . Shock liver 01/24/2017  . Cor pulmonale, chronic ([place]) 01/24/2017  . Elevated troponin   . Flu-like symptoms 01/18/2017  . Fever 01/18/2017  . HCAP (healthcare-associated pneumonia) 12/31/2016  . Numbness and tingling in right hand 08/13/2016  . Sepsis ([place]) 08/11/2016  . Coagulase negative Staphylococcus bacteremia 06/11/2016  . UTI (lower urinary tract infection) 06/06/2016  . ESRD (end stage renal disease) ([place]) 06/06/2016  . Chronic cholecystitis 12/09/2015  . Protein-calorie malnutrition, severe 11/29/2015  . Respiratory arrest ([place]) 11/28/2015  . Recurrent cellulitis of lower leg 11/13/2015  . Chronic diastolic CHF (congestive heart failure) ([place]) 11/13/2015  . Chronic respiratory failure with hypoxia ([place]) 11/13/2015  . ESRD on dialysis ([place]) 11/13/2015  . Anemia of chronic disease 11/13/2015  . Rotator cuff syndrome of right shoulder 05/30/2013  . Pain in joint, shoulder region 05/30/2013  . Obesity hypoventilation syndrome ([place]) 09/04/2012  . Leukopenia 11/11/2011  . HTN (hypertension) 11/11/2011  . HLD (hyperlipidemia) 11/11/2011  . Transaminitis 11/11/2011  . Diabetes mellitus ([place]) 10/27/2011  . Morbid obesity ([place]) 10/27/2011   . Lymphedema of lower extremity 10/04/2011    Orientation RESPIRATION BLADDER Height & Weight     Self, Time, Situation, Place  Normal Continent Weight: (!) 326 lb 4.5 oz (148 kg) Height:  5' 5" (165.1 cm)  BEHAVIORAL SYMPTOMS/MOOD NEUROLOGICAL BOWEL NUTRITION STATUS      Continent Diet (Diet Renal/carb modified with fluid restriction.)  AMBULATORY STATUS COMMUNICATION OF NEEDS Skin   Extensive Assist Verbally Normal                       Personal Care Assistance Level of Assistance  Bathing, Feeding, Dressing Bathing Assistance: Maximum assistance Feeding assistance: Independent Dressing Assistance: Maximum assistance     Functional Limitations Info  Sight, Speech, Hearing Sight Info: Adequate Hearing Info: Adequate Speech Info: Adequate    SPECIAL CARE FACTORS FREQUENCY  PT (By licensed PT)     PT Frequency: 5x/week              Contractures Contractures Info: Not present    Additional Factors Info  Allergies, Psychotropic   Allergies Info: Contrast Media, Pneumococcal Vaccines, Shellfish Allergy, Vancomycin Psychotropic Info: Xanax         Current Medications (01/28/2017):  This is the current hospital active medication list Current Facility-Administered Medications  Medication Dose Route Frequency Provider Last Rate Last Dose  . 0.9 %  sodium chloride infusion  100 mL Intravenous PRN Fran Lowes, MD      . 0.9 %  sodium chloride infusion  100 mL Intravenous PRN Fran Lowes, MD      . acetaminophen (TYLENOL) tablet 650 mg  650 mg Oral Q4H PRN [name], MD  650 mg at 01/25/17 1848  . albuterol (PROVENTIL) (2.5 MG/3ML) 0.083% nebulizer solution 2.5 mg  2.5 mg Nebulization Q6H PRN Phillips Grout, MD   2.5 mg at 01/20/17 1433  . ALPRAZolam Duanne Moron) tablet 0.5 mg  0.5 mg Oral TID PRN Phillips Grout, MD   0.5 mg at 01/20/17 2002  . alteplase (CATHFLO ACTIVASE) injection 2 mg  2 mg Intracatheter Once PRN Fran Lowes, MD      .  ceFEPIme (MAXIPIME) 2 g in dextrose 5 % 50 mL IVPB  2 g Intravenous Q M,W,F-HD [name], MD   2 g at 01/26/17 1238  . Chlorhexidine Gluconate Cloth 2 % PADS 6 each  6 each Topical Daily [name], MD   6 each at 01/27/17 1014  . epoetin alfa (EPOGEN,PROCRIT) injection 8,000 Units  8,000 Units Intravenous Q M,W,F-HD Liana Gerold, MD   8,000 Units at 01/26/17 1121  . guaiFENesin-dextromethorphan (ROBITUSSIN DM) 100-10 MG/5ML syrup 5 mL  5 mL Oral Q4H PRN [name], MD   5 mL at 01/21/17 0752  . heparin injection 1,000 Units  1,000 Units Dialysis PRN Fran Lowes, MD      . insulin aspart (novoLOG) injection 0-9 Units  0-9 Units Subcutaneous TID WC Phillips Grout, MD   3 Units at 01/28/17 1005  . levalbuterol (XOPENEX) nebulizer solution 1.25 mg  1.25 mg Nebulization Q6H PRN Phillips Grout, MD   1.25 mg at 01/18/17 2058  . MEDLINE mouth rinse  15 mL Mouth Rinse BID [name], MD   15 mL at 01/28/17 1008  . ondansetron (ZOFRAN) 8 mg in sodium chloride 0.9 % 50 mL IVPB  8 mg Intravenous Q6H [name], MD   8 mg at 01/28/17 1005  . ondansetron (ZOFRAN) tablet 4 mg  4 mg Oral Q6H PRN Phillips Grout, MD   4 mg at 01/26/17 1221   Or  . ondansetron (ZOFRAN) injection 4 mg  4 mg Intravenous Q6H PRN Phillips Grout, MD   4 mg at 01/27/17 0247  . oxyCODONE (Oxy IR/ROXICODONE) immediate release tablet 10 mg  10 mg Oral TID PRN Phillips Grout, MD   10 mg at 01/28/17 0604  . phenylephrine ([name]-SYNEPHRINE) 40 mg in sodium chloride 0.9 % 250 mL (0.16 mg/mL) infusion  0-400 mcg/min Intravenous Titrated [name], MD   Stopped at 01/26/17 1300  . sodium chloride flush (NS) 0.9 % injection 10-40 mL  10-40 mL Intracatheter Q12H [name], MD   10 mL at 01/27/17 1026  . sodium chloride flush (NS) 0.9 % injection 10-40 mL  10-40 mL Intracatheter PRN [name], MD      . technetium tetrofosmin (TC-MYOVIEW) injection 10 millicurie  10 millicurie Intravenous Once PRN Lavonia Dana, MD      . technetium tetrofosmin (TC-MYOVIEW) injection 30 millicurie  30 millicurie Intravenous Once PRN Lavonia Dana, MD      . warfarin (COUMADIN) tablet 5 mg  5 mg Oral Once [name], MD      . Warfarin - Pharmacist Dosing Inpatient   Does not apply [id number] [name], MD   1 each at 01/27/17 1600     Discharge Medications: Please see discharge summary for a list of discharge medications.  Relevant Imaging Results:  Relevant Lab Results:   Additional Information SSN [id number]. Patient has Medicare part A. Patient goes to [facility] M, W, F 5:30 a.m.-11:00 a.m. Her family provides transportation.   Ambrose Pancoast  D, LCSW

## 2017-01-29 LAB — COMPREHENSIVE METABOLIC PANEL
ALBUMIN: 2.5 g/dL — AB (ref 3.5–5.0)
ALK PHOS: 170 U/L — AB (ref 38–126)
ALT: 286 U/L — AB (ref 14–54)
AST: 26 U/L (ref 15–41)
Anion gap: 11 (ref 5–15)
BUN: 36 mg/dL — ABNORMAL HIGH (ref 6–20)
CALCIUM: 8.5 mg/dL — AB (ref 8.9–10.3)
CHLORIDE: 96 mmol/L — AB (ref 101–111)
CO2: 28 mmol/L (ref 22–32)
CREATININE: 3.96 mg/dL — AB (ref 0.44–1.00)
GFR calc Af Amer: 14 mL/min — ABNORMAL LOW (ref >60)
GFR calc non Af Amer: 12 mL/min — ABNORMAL LOW (ref >60)
GLUCOSE: 159 mg/dL — AB (ref 65–99)
Potassium: 3.4 mmol/L — ABNORMAL LOW (ref 3.5–5.1)
SODIUM: 135 mmol/L (ref 135–145)
Total Bilirubin: 0.9 mg/dL (ref 0.3–1.2)
Total Protein: 6.5 g/dL (ref 6.5–8.1)

## 2017-01-29 LAB — CBC
HEMATOCRIT: 29 % — AB (ref 36.0–46.0)
HEMOGLOBIN: 9.8 g/dL — AB (ref 12.0–15.0)
MCH: 32.1 pg (ref 26.0–34.0)
MCHC: 33.8 g/dL (ref 30.0–36.0)
MCV: 95.1 fL (ref 78.0–100.0)
Platelets: 187 10*3/uL (ref 150–400)
RBC: 3.05 MIL/uL — AB (ref 3.87–5.11)
RDW: 15.8 % — ABNORMAL HIGH (ref 11.5–15.5)
WBC: 19.4 10*3/uL — AB (ref 4.0–10.5)

## 2017-01-29 LAB — GLUCOSE, CAPILLARY
GLUCOSE-CAPILLARY: 176 mg/dL — AB (ref 65–99)
GLUCOSE-CAPILLARY: 220 mg/dL — AB (ref 65–99)
Glucose-Capillary: 155 mg/dL — ABNORMAL HIGH (ref 65–99)
Glucose-Capillary: 139 mg/dL — ABNORMAL HIGH (ref 65–99)

## 2017-01-29 LAB — PROTIME-INR
INR: 1.94
Prothrombin Time: 22.4 seconds — ABNORMAL HIGH (ref 11.4–15.2)

## 2017-01-29 MED ORDER — METOCLOPRAMIDE HCL 5 MG/ML IJ SOLN
5.0000 mg | Freq: Four times a day (QID) | INTRAMUSCULAR | Status: DC
  Administered 2017-01-29 – 2017-02-01 (×12): 5 mg via INTRAVENOUS
  Filled 2017-01-29 (×12): qty 2

## 2017-01-29 MED ORDER — WARFARIN SODIUM 7.5 MG PO TABS
7.5000 mg | ORAL_TABLET | Freq: Once | ORAL | Status: AC
  Administered 2017-01-29: 7.5 mg via ORAL
  Filled 2017-01-29: qty 1

## 2017-01-29 NOTE — Progress Notes (Signed)
Pharmacy Antibiotic Note  [name] is a 51 y.o. female admitted on 01/18/2017 with sepsis.  Pharmacy has been consulted for cefepime dosing. Blood cx + for Serratia. She has cellulitis. Day #10 of Cefepime. Patient is improving but having some Nausea and vomiting  without unclear etiology. WBC remains elevated.  Plan: Cont cefepime 2gm after each HD session Monitor labs, micro and vitals. F/U LOT   Height: _0  (165.1 cm) Weight: (!) 324 lb 4.8 oz (147.1 kg) IBW/kg (Calculated) : 57  Temp (24hrs), Avg:98.4 F (36.9 C), Min:98.3 F (36.8 C), Max:98.5 F (36.9 C)   Recent Labs Lab 01/25/17 0449 01/26/17 0430 01/27/17 0457 01/28/17 0457 01/29/17 0440  WBC 19.6* 22.9* 22.6* 20.0* 19.4*  CREATININE 4.80* 5.69* 4.34* 5.25* 3.96*    Estimated Creatinine Clearance: 25 mL/min (by C-G formula based on SCr of 3.96 mg/dL (H)).    Allergies  Allergen Reactions  . Contrast Media [Iodinated Diagnostic Agents] Anaphylaxis, Hives, Swelling and Other (See Comments)    Dye for cardiac cath. Tongue swells  . Pneumococcal Vaccines Swelling    Turns skin black, and bodily swelling  . Shellfish Allergy Itching, Swelling and Other (See Comments)    Facial swelling - Pt able to eat now 11/25/16  . Vancomycin Nausea And Vomiting and Other (See Comments)    Infusion "made me feel like I was dying" had to be readmitted to hospital   Antimicrobials this admission: Levaquin 1/30 >> 2/4 Cefepime 2/1 >>  Dose adjustments this admission: ESRD dosing  Microbiology results: 1/30 Bcx: Serratia Marcescens x  2 bottles 1/30 MRSA PCR: negative 1/30 influenza: negative  Culture SERRATIA MARCESCENS (A)    Report Status 01/21/2017 FINAL   Organism ID, Bacteria SERRATIA MARCESCENS  Culture & Susceptibility   SERRATIA MARCESCENS   Antibiotic Sensitivity Microscan Status  CEFAZOLIN Resistant >=64 RESISTANT Final  Method: MIC  CEFEPIME Sensitive <=1 SENSITIVE Final  Method: MIC   CEFTAZIDIME Sensitive <=1 SENSITIVE Final  Method: MIC  CEFTRIAXONE Sensitive <=1 SENSITIVE Final  Method: MIC  CIPROFLOXACIN Sensitive <=0.25 SENSITIVE Final  Method: MIC  GENTAMICIN Sensitive <=1 SENSITIVE Final  Method: MIC  TRIMETH/SULFA Sensitive <=20 SENSITIVE Final  Method: MIC  Comments SERRATIA MARCESCENS (MIC)   SERRATIA MARCESCENS           Thank you for allowing pharmacy to be a part of this patient's care.  Isac Sarna, BS Pharm D, [place] Clinical Pharmacist Pager [phone] 01/29/2017 9:18 AM

## 2017-01-29 NOTE — Progress Notes (Addendum)
Subjective: She is still having significant trouble with nausea. Otherwise she feels better. No new complaints. She has vomited. Her mental status is improved. Her leg is better. She will finish a ten-day course of cefepime today and we can stop. No chest pain no complaints of shortness of breath.  Objective: Vital signs in last 24 hours: Temp:  [98.3 F (36.8 C)-98.5 F (36.9 C)] 98.3 F (36.8 C) (02/10 0400) Pulse Rate:  [69-80] 76 (02/09 1900) Resp:  [13-20] 16 (02/09 1900) BP: (97-138)/(53-88) 125/67 (02/09 1900) SpO2:  [99 %-100 %] 100 % (02/09 1900) Weight:  [147.1 kg (324 lb 4.8 oz)-148 kg (326 lb 4.5 oz)] 147.1 kg (324 lb 4.8 oz) (02/10 0400) Weight change: 0 kg (0 lb) Last BM Date: 01/28/17  Intake/Output from previous day: 02/09 0701 - 02/10 0700 In: 1052 [P.O.:840; IV Piggyback:212] Out: 4300 [Urine:1800]  PHYSICAL EXAM General appearance: alert, cooperative, moderate distress, morbidly obese and Actively regurgitating Resp: alert, cooperative, moderate distress, morbidly obese and Actively regurgitating Cardio: regular rate and rhythm, S1, S2 normal, no murmur, click, rub or gallop GI: soft, non-tender; bowel sounds normal; no masses,  no organomegaly Extremities: extremities normal, atraumatic, no cyanosis or edema Her lung exam shows it is clear. There was a Retail banker and it put the Gen. appearance under respiratory. She has lymphedema of the lower extremities. No cellulitis now. She is not confused now  Lab Results:  Results for orders placed or performed during the hospital encounter of 01/18/17 (from the past 48 hour(s))  Glucose, capillary     Status: Abnormal   Collection Time: 01/27/17 11:11 AM  Result Value Ref Range   Glucose-Capillary 174 (H) 65 - 99 mg/dL  Heparin level (unfractionated)     Status: Abnormal   Collection Time: 01/27/17 12:57 PM  Result Value Ref Range   Heparin Unfractionated 0.85 (H) 0.30 - 0.70 IU/mL    Comment:        IF  HEPARIN RESULTS ARE BELOW EXPECTED VALUES, AND PATIENT DOSAGE HAS BEEN CONFIRMED, SUGGEST FOLLOW UP TESTING OF ANTITHROMBIN III LEVELS.   Glucose, capillary     Status: Abnormal   Collection Time: 01/27/17  4:56 PM  Result Value Ref Range   Glucose-Capillary 181 (H) 65 - 99 mg/dL   Comment 1 Notify RN    Comment 2 Document in Chart   Glucose, capillary     Status: Abnormal   Collection Time: 01/27/17  9:34 PM  Result Value Ref Range   Glucose-Capillary 210 (H) 65 - 99 mg/dL   Comment 1 Notify RN   CBC     Status: Abnormal   Collection Time: 01/28/17  4:57 AM  Result Value Ref Range   WBC 20.0 (H) 4.0 - 10.5 K/uL   RBC 2.89 (L) 3.87 - 5.11 MIL/uL   Hemoglobin 9.4 (L) 12.0 - 15.0 g/dL   HCT 27.4 (L) 36.0 - 46.0 %   MCV 94.8 78.0 - 100.0 fL   MCH 32.5 26.0 - 34.0 pg   MCHC 34.3 30.0 - 36.0 g/dL   RDW 15.3 11.5 - 15.5 %   Platelets 190 150 - 400 K/uL  Heparin level (unfractionated)     Status: Abnormal   Collection Time: 01/28/17  4:57 AM  Result Value Ref Range   Heparin Unfractionated 0.15 (L) 0.30 - 0.70 IU/mL    Comment:        IF HEPARIN RESULTS ARE BELOW EXPECTED VALUES, AND PATIENT DOSAGE HAS BEEN CONFIRMED, SUGGEST FOLLOW UP TESTING  OF ANTITHROMBIN III LEVELS.   Protime-INR     Status: Abnormal   Collection Time: 01/28/17  4:57 AM  Result Value Ref Range   Prothrombin Time 23.6 (H) 11.4 - 15.2 seconds   INR 2.07   Renal function panel     Status: Abnormal   Collection Time: 01/28/17  4:57 AM  Result Value Ref Range   Sodium 132 (L) 135 - 145 mmol/L   Potassium 3.5 3.5 - 5.1 mmol/L   Chloride 95 (L) 101 - 111 mmol/L   CO2 27 22 - 32 mmol/L   Glucose, Bld 229 (H) 65 - 99 mg/dL   BUN 59 (H) 6 - 20 mg/dL   Creatinine, Ser 5.25 (H) 0.44 - 1.00 mg/dL   Calcium 7.8 (L) 8.9 - 10.3 mg/dL   Phosphorus 6.0 (H) 2.5 - 4.6 mg/dL   Albumin 2.3 (L) 3.5 - 5.0 g/dL   GFR calc non Af Amer 9 (L) >60 mL/min   GFR calc Af Amer 10 (L) >60 mL/min    Comment: (NOTE) The eGFR  has been calculated using the CKD EPI equation. This calculation has not been validated in all clinical situations. eGFR's persistently <60 mL/min signify possible Chronic Kidney Disease.    Anion gap 10 5 - 15  Glucose, capillary     Status: Abnormal   Collection Time: 01/28/17  7:16 AM  Result Value Ref Range   Glucose-Capillary 218 (H) 65 - 99 mg/dL  Glucose, capillary     Status: Abnormal   Collection Time: 01/28/17 11:27 AM  Result Value Ref Range   Glucose-Capillary 324 (H) 65 - 99 mg/dL  Glucose, capillary     Status: Abnormal   Collection Time: 01/28/17  6:38 PM  Result Value Ref Range   Glucose-Capillary 140 (H) 65 - 99 mg/dL  Glucose, capillary     Status: Abnormal   Collection Time: 01/28/17  9:19 PM  Result Value Ref Range   Glucose-Capillary 136 (H) 65 - 99 mg/dL   Comment 1 Notify RN    Comment 2 Document in Chart   CBC     Status: Abnormal   Collection Time: 01/29/17  4:40 AM  Result Value Ref Range   WBC 19.4 (H) 4.0 - 10.5 K/uL   RBC 3.05 (L) 3.87 - 5.11 MIL/uL   Hemoglobin 9.8 (L) 12.0 - 15.0 g/dL   HCT 29.0 (L) 36.0 - 46.0 %   MCV 95.1 78.0 - 100.0 fL   MCH 32.1 26.0 - 34.0 pg   MCHC 33.8 30.0 - 36.0 g/dL   RDW 15.8 (H) 11.5 - 15.5 %   Platelets 187 150 - 400 K/uL  Protime-INR     Status: Abnormal   Collection Time: 01/29/17  4:40 AM  Result Value Ref Range   Prothrombin Time 22.4 (H) 11.4 - 15.2 seconds   INR 1.94   Comprehensive metabolic panel     Status: Abnormal   Collection Time: 01/29/17  4:40 AM  Result Value Ref Range   Sodium 135 135 - 145 mmol/L   Potassium 3.4 (L) 3.5 - 5.1 mmol/L   Chloride 96 (L) 101 - 111 mmol/L   CO2 28 22 - 32 mmol/L   Glucose, Bld 159 (H) 65 - 99 mg/dL   BUN 36 (H) 6 - 20 mg/dL   Creatinine, Ser 3.96 (H) 0.44 - 1.00 mg/dL   Calcium 8.5 (L) 8.9 - 10.3 mg/dL   Total Protein 6.5 6.5 - 8.1 g/dL   Albumin 2.5 (L)  3.5 - 5.0 g/dL   AST 26 15 - 41 U/L   ALT 286 (H) 14 - 54 U/L   Alkaline Phosphatase 170 (H) 38 - 126  U/L   Total Bilirubin 0.9 0.3 - 1.2 mg/dL   GFR calc non Af Amer 12 (L) >60 mL/min   GFR calc Af Amer 14 (L) >60 mL/min    Comment: (NOTE) The eGFR has been calculated using the CKD EPI equation. This calculation has not been validated in all clinical situations. eGFR's persistently <60 mL/min signify possible Chronic Kidney Disease.    Anion gap 11 5 - 15  Glucose, capillary     Status: Abnormal   Collection Time: 01/29/17  7:36 AM  Result Value Ref Range   Glucose-Capillary 155 (H) 65 - 99 mg/dL    ABGS No results for input(s): PHART, PO2ART, TCO2, HCO3 in the last 72 hours.  Invalid input(s): PCO2 CULTURES Recent Results (from the past 240 hour(s))  Culture, blood (Routine X 2) w Reflex to ID Panel     Status: None (Preliminary result)   Collection Time: 01/27/17  7:41 AM  Result Value Ref Range Status   Specimen Description BLOOD RIGHT ARM  Final   Special Requests   Final    BOTTLES DRAWN AEROBIC AND ANAEROBIC  8 CC EACH BOTTLE   Culture NO GROWTH < 24 HOURS  Final   Report Status PENDING  Incomplete  Culture, blood (Routine X 2) w Reflex to ID Panel     Status: None (Preliminary result)   Collection Time: 01/27/17  7:50 AM  Result Value Ref Range Status   Specimen Description BLOOD CENTRAL LINE  Final   Special Requests BOTTLES DRAWN AEROBIC ONLY 8 CC  Final   Culture NO GROWTH < 24 HOURS  Final   Report Status PENDING  Incomplete   Studies/Results: US Abdomen Complete  Result Date: 01/27/2017 CLINICAL DATA:  Nausea and vomiting with increasing severity over the past 2 days. History of morbid obesity, end-stage renal disease, diabetes, cholecystitis. EXAM: ABDOMEN ULTRASOUND COMPLETE COMPARISON:  Abdominopelvic CT scan of August 12, 2016. FINDINGS: Gallbladder: The gallbladder is contracted. There is gallbladder wall thickening accentuated by the contracted state. No stones are evident. There is no positive sonographic Murphy's sign. Common bile duct: Diameter: 3.9 mm.  Liver: The hepatic echotexture is slightly heterogeneous. There is no discrete mass or ductal dilation. IVC: No abnormality visualized. Pancreas: Visualized portion unremarkable. Spleen: Normal where visualized. Right Kidney: Length: 12.4 cm. Echogenicity within normal limits. No mass or hydronephrosis visualized. Left Kidney: Length: 11.7 cm. Visualization of the renal parenchyma was somewhat limited due to bowel gas and the patient's body habitus. There is no hydronephrosis. Abdominal aorta: No aneurysm visualized. Other findings: No ascites is observed. IMPRESSION: The study is limited due to bowel gas and the patient's body habitus. The gallbladder is contracted. No definite stones are observed. No positive sonographic Murphy's sign. If chronic cholecystitis is suspected clinically, a nuclear medicine hepatobiliary scan with gallbladder ejection fraction determination may be useful. Probable mild fatty infiltrative change of the liver. No acute intra-abdominal abnormality is observed otherwise. Electronically Signed   By: David  Martinique M.D.   On: 01/27/2017 13:25    Medications:  Prior to Admission:  Prescriptions Prior to Admission  Medication Sig Dispense Refill Last Dose  . acetaminophen (TYLENOL) 325 MG tablet Take 650 mg by mouth every 6 (six) hours as needed for moderate pain or fever.    01/17/2017 at Unknown time  .  albuterol (PROVENTIL) (2.5 MG/3ML) 0.083% nebulizer solution Take 2.5 mg by nebulization every 6 (six) hours as needed for wheezing or shortness of breath.   Past Month at Unknown time  . ALPRAZolam (XANAX) 0.5 MG tablet Take 0.5 mg by mouth 3 (three) times daily as needed for anxiety or sleep.    01/17/2017 at Unknown time  . amLODipine (NORVASC) 10 MG tablet Take 0.5 tablets (5 mg total) by mouth daily. 15 tablet 12 01/17/2017 at Unknown time  . cinacalcet (SENSIPAR) 30 MG tablet Take 30 mg by mouth daily.   01/17/2017 at Unknown time  . diphenhydrAMINE (BENADRYL) 25 mg capsule Take  25 mg by mouth every 6 (six) hours as needed for allergies.   unknown  . Insulin Glargine (LANTUS SOLOSTAR) 100 UNIT/ML Solostar Pen Inject 10 Units into the skin daily at 10 pm. 15 mL 11 01/17/2017 at Unknown time  . metoprolol (LOPRESSOR) 50 MG tablet Take 0.5 tablets (25 mg total) by mouth 2 (two) times daily. 30 tablet 12 01/17/2017 at Unknown time  . multivitamin (RENA-VIT) TABS tablet Take 1 tablet by mouth daily.   01/17/2017 at Unknown time  . Oxycodone HCl 10 MG TABS Take 1 tablet (10 mg total) by mouth 3 (three) times daily as needed (pain). (Patient taking differently: Take 10 mg by mouth 4 (four) times daily as needed (pain). ) 15 tablet 0 unknown  . sevelamer carbonate (RENVELA) 800 MG tablet Take 1,600-3,200 mg by mouth See admin instructions. Take 3200 mg by mouth 3 times daily with meals and take 1600 mg by mouth with snacks.   01/17/2017 at Unknown time  . doxycycline (VIBRAMYCIN) 50 MG capsule Take 2 capsules (100 mg total) by mouth 2 (two) times daily. (Patient not taking: Reported on 01/18/2017) 20 capsule 0 Not Taking at Unknown time  . predniSONE (STERAPRED UNI-PAK 21 TAB) 10 MG (21) TBPK tablet Take by package instructions (Patient not taking: Reported on 01/18/2017) 21 tablet 0 Not Taking at Unknown time   Scheduled: . ceFEPime (MAXIPIME) IV  2 g Intravenous Q M,W,F-HD  . Chlorhexidine Gluconate Cloth  6 each Topical Daily  . epoetin (EPOGEN/PROCRIT) injection  8,000 Units Intravenous Q M,W,F-HD  . insulin aspart  0-9 Units Subcutaneous TID WC  . mouth rinse  15 mL Mouth Rinse BID  . ondansetron (ZOFRAN) IV  8 mg Intravenous Q6H  . sodium chloride flush  10-40 mL Intracatheter Q12H  . warfarin  7.5 mg Oral Once  . Warfarin - Pharmacist Dosing Inpatient   Does not apply q1800   Continuous: . phenylephrine ([name]-SYNEPHRINE) Adult infusion Stopped (01/26/17 1300)   ASN:KNLZJQ chloride, sodium chloride, acetaminophen, albuterol, ALPRAZolam, alteplase, guaiFENesin-dextromethorphan,  heparin, levalbuterol, ondansetron **OR** ondansetron (ZOFRAN) IV, oxyCODONE, sodium chloride flush, technetium tetrofosmin, technetium tetrofosmin  Assesment: She was admitted with cellulitis has Serratia in her blood developed septic shock shock liver. She has developed an acute encephalopathy related to her illness. She has chronic lymphedema of her leg and chronic and recurrent cellulitis. She has end-stage renal disease on dialysis and she dialyzed yesterday. She has chronic cor pulmonale likely related to obesity hypoventilation/sleep apnea. She has chronic diastolic heart failure managed with dialysis. She has diabetes which is pre-well controlled. She had elevated troponin level felt not to represent acute coronary syndrome. Her nausea is still giving her problems and I told her we can try Reglan. We discussed the potential side effects at length. She has had trouble with her gallbladder in the past and her gallbladder  looks contracted on ultrasound. No good evidence of cholecystitis. She may have biliary dyskinesia but I don't think she is an operative candidate at this time her liver function continues to improve Principal Problem:   Fever Active Problems:   Diabetes mellitus ([place])   Morbid obesity ([place])   Obesity hypoventilation syndrome ([facility])   Recurrent cellulitis of lower leg   Chronic diastolic CHF (congestive heart failure) ([facility])   Chronic respiratory failure with hypoxia ([facility])   ESRD on dialysis ([facility])   Flu-like symptoms   Elevated troponin   Septic shock ([facility])   Shock liver   Cor pulmonale, chronic ([facility])    Plan: Add Reglan. Continue other treatments    LOS: 11 days   Ugochukwu Chichester L 01/29/2017, 9:32 AM

## 2017-01-29 NOTE — Progress Notes (Signed)
[facility] for heparin and coumadin Indication: cor pulmonale, r/o PE  Allergies  Allergen Reactions  . Contrast Media [Iodinated Diagnostic Agents] Anaphylaxis, Hives, Swelling and Other (See Comments)    Dye for cardiac cath. Tongue swells  . Pneumococcal Vaccines Swelling    Turns skin black, and bodily swelling  . Shellfish Allergy Itching, Swelling and Other (See Comments)    Facial swelling - Pt able to eat now 11/25/16  . Vancomycin Nausea And Vomiting and Other (See Comments)    Infusion "made me feel like I was dying" had to be readmitted to hospital   Patient Measurements: Height: _0  (165.1 cm) Weight: (!) 324 lb 4.8 oz (147.1 kg) IBW/kg (Calculated) : 57 Heparin Dosing Weight: 93 kg  Vital Signs: Temp: 98.3 F (36.8 C) (02/10 0400) Temp Source: Oral (02/10 0000)  Labs:  Recent Labs  01/27/17 0457 01/27/17 1257 01/28/17 0457 01/29/17 0440  HGB 9.6*  --  9.4* 9.8*  HCT 27.7*  --  27.4* 29.0*  PLT 196  --  190 187  LABPROT 22.9*  --  23.6* 22.4*  INR 1.99  --  2.07 1.94  HEPARINUNFRC 1.38* 0.85* 0.15*  --   CREATININE 4.34*  --  5.25* 3.96*   Estimated Creatinine Clearance: 25 mL/min (by C-G formula based on SCr of 3.96 mg/dL (H)).  Medical History: Past Medical History:  Diagnosis Date  . Anxiety   . Asthma    as a child  . Cancer ([place])    thyroid  . Cellulitis   . CHF (congestive heart failure) ([address])   . Chiari malformation    s/p surgery  . Chiari malformation   . Chronic pain   . Complication of anesthesia 11/28/15   Resp arrest after  conscious  sedation  . Constipation   . Coronary artery disease    40-50% mid LAD 04/29/09, Medical tx. (Dr. Gwenlyn Found)  . Diabetes mellitus    Type II  . Fibromyalgia   . History of blood transfusion    hemorrage duinrg pregancy  . Hx of echocardiogram 10/2011   EF 55-60%  . Hypercholesterolemia   . Hypertension   . Lymph edema   . Obesity hypoventilation syndrome  ([place])   . On home O2   . Pneumonia    in past  . Renal disorder    Pt started dialysis in Dec.2016  . S/P colonoscopy 05/26/2007   Dr. Laural Golden sigmoid diverticulosis random biopsies benign  . S/P endoscopy 05/01/2009   Dr. Penelope Coop pill-induced esophageal ulcerations distal to midesophagus, 2 small ulcers in the antrum of the stomach  . Shortness of breath dyspnea    with any exertion or if heart rate  is irregular while on dialysis  . Sleep apnea    Medications:  Prescriptions Prior to Admission  Medication Sig Dispense Refill Last Dose  . acetaminophen (TYLENOL) 325 MG tablet Take 650 mg by mouth every 6 (six) hours as needed for moderate pain or fever.    01/17/2017 at Unknown time  . albuterol (PROVENTIL) (2.5 MG/3ML) 0.083% nebulizer solution Take 2.5 mg by nebulization every 6 (six) hours as needed for wheezing or shortness of breath.   Past Month at Unknown time  . ALPRAZolam (XANAX) 0.5 MG tablet Take 0.5 mg by mouth 3 (three) times daily as needed for anxiety or sleep.    01/17/2017 at Unknown time  . amLODipine (NORVASC) 10 MG tablet Take 0.5 tablets (5 mg total) by  mouth daily. 15 tablet 12 01/17/2017 at Unknown time  . cinacalcet (SENSIPAR) 30 MG tablet Take 30 mg by mouth daily.   01/17/2017 at Unknown time  . diphenhydrAMINE (BENADRYL) 25 mg capsule Take 25 mg by mouth every 6 (six) hours as needed for allergies.   unknown  . Insulin Glargine (LANTUS SOLOSTAR) 100 UNIT/ML Solostar Pen Inject 10 Units into the skin daily at 10 pm. 15 mL 11 01/17/2017 at Unknown time  . metoprolol (LOPRESSOR) 50 MG tablet Take 0.5 tablets (25 mg total) by mouth 2 (two) times daily. 30 tablet 12 01/17/2017 at Unknown time  . multivitamin (RENA-VIT) TABS tablet Take 1 tablet by mouth daily.   01/17/2017 at Unknown time  . Oxycodone HCl 10 MG TABS Take 1 tablet (10 mg total) by mouth 3 (three) times daily as needed (pain). (Patient taking differently: Take 10 mg by mouth 4 (four) times daily as needed  (pain). ) 15 tablet 0 unknown  . sevelamer carbonate (RENVELA) 800 MG tablet Take 1,600-3,200 mg by mouth See admin instructions. Take 3200 mg by mouth 3 times daily with meals and take 1600 mg by mouth with snacks.   01/17/2017 at Unknown time  . doxycycline (VIBRAMYCIN) 50 MG capsule Take 2 capsules (100 mg total) by mouth 2 (two) times daily. (Patient not taking: Reported on 01/18/2017) 20 capsule 0 Not Taking at Unknown time  . predniSONE (STERAPRED UNI-PAK 21 TAB) 10 MG (21) TBPK tablet Take by package instructions (Patient not taking: Reported on 01/18/2017) 21 tablet 0 Not Taking at Unknown time   Assessment: 51 yo lady to start heparin for cor pulmonale, r/o PE. Ventilation/perfusion lung scan is reported as very low probability for PE.  Coumadin has been resumed.  INR 1.94 today and yesterday  2.07 . No bleeding noted. Will give boosted dose of coumadin today. Goal of Therapy:  INR 2-3 Monitor platelets by anticoagulation protocol: Yes    Plan:  Coumadin  7.79m today x 1 Daily Pt-INR Monitor for bleeding complications  Thanks for allowing pharmacy to be a part of this patient's care.  LIsac Sarna BS PVena Austria BCPS Clinical Pharmacist Pager #[phone]/09/2017,9:13 AM

## 2017-01-29 NOTE — Progress Notes (Signed)
Subjective: Interval History: patient continued to complaints of nausea and vomiting. She is not able to keep anything down. She denies any abdominal pain. She also moves her bowels. Presently she denies any difficulty breathing.  Objective: Vital signs in last 24 hours: Temp:  [98.3 F (36.8 C)-98.5 F (36.9 C)] 98.3 F (36.8 C) (02/10 0400) Pulse Rate:  [69-80] 76 (02/09 1900) Resp:  [13-20] 16 (02/09 1900) BP: (97-138)/(53-88) 125/67 (02/09 1900) SpO2:  [99 %-100 %] 100 % (02/09 1900) Weight:  [147.1 kg (324 lb 4.8 oz)-148 kg (326 lb 4.5 oz)] 147.1 kg (324 lb 4.8 oz) (02/10 0400) Weight change: 0 kg (0 lb)  Intake/Output from previous day: 02/09 0701 - 02/10 0700 In: 1052 [P.O.:840; IV Piggyback:212] Out: 4300 [Urine:1800] Intake/Output this shift: No intake/output data recorded.  General appearance: alert, cooperative and no distress Resp: diminished breath sounds anterior - bilateral and wheezes bilaterally Cardio: regular rate and rhythm Extremities: edema Patient with venous stasis and chronic leg edema  Lab Results:  Recent Labs  01/28/17 0457 01/29/17 0440  WBC 20.0* 19.4*  HGB 9.4* 9.8*  HCT 27.4* 29.0*  PLT 190 187   BMET:   Recent Labs  01/28/17 0457 01/29/17 0440  NA 132* 135  K 3.5 3.4*  CL 95* 96*  CO2 27 28  GLUCOSE 229* 159*  BUN 59* 36*  CREATININE 5.25* 3.96*  CALCIUM 7.8* 8.5*   No results for input(s): PTH in the last 72 hours. Iron Studies: No results for input(s): IRON, TIBC, TRANSFERRIN, FERRITIN in the last 72 hours.  Studies/Results: US Abdomen Complete  Result Date: 01/27/2017 CLINICAL DATA:  Nausea and vomiting with increasing severity over the past 2 days. History of morbid obesity, end-stage renal disease, diabetes, cholecystitis. EXAM: ABDOMEN ULTRASOUND COMPLETE COMPARISON:  Abdominopelvic CT scan of August 12, 2016. FINDINGS: Gallbladder: The gallbladder is contracted. There is gallbladder wall thickening accentuated by the  contracted state. No stones are evident. There is no positive sonographic Murphy's sign. Common bile duct: Diameter: 3.9 mm. Liver: The hepatic echotexture is slightly heterogeneous. There is no discrete mass or ductal dilation. IVC: No abnormality visualized. Pancreas: Visualized portion unremarkable. Spleen: Normal where visualized. Right Kidney: Length: 12.4 cm. Echogenicity within normal limits. No mass or hydronephrosis visualized. Left Kidney: Length: 11.7 cm. Visualization of the renal parenchyma was somewhat limited due to bowel gas and the patient's body habitus. There is no hydronephrosis. Abdominal aorta: No aneurysm visualized. Other findings: No ascites is observed. IMPRESSION: The study is limited due to bowel gas and the patient's body habitus. The gallbladder is contracted. No definite stones are observed. No positive sonographic Murphy's sign. If chronic cholecystitis is suspected clinically, a nuclear medicine hepatobiliary scan with gallbladder ejection fraction determination may be useful. Probable mild fatty infiltrative change of the liver. No acute intra-abdominal abnormality is observed otherwise. Electronically Signed   By: David  Martinique M.D.   On: 01/27/2017 13:25    I have reviewed the patient's current medications.  Assessment/Plan: Problem #1 sepsis: Patient continued to be on antibiotics and improving. Problem #2 end-stage renal disease:She is status post hemodialysis yesterday. Her potassium is slightly low Problem #3 anemia: Her hemoglobin slightly low but stable. Patient is on Epogen. Problem #4 fluid management: Patient has lost about 4300 mL's the last 24 hours. 2.5 L is from ultrafiltration during dialysis but the remaining  is from vomiting. Problem #5 metabolic bone disease: Calcium is in range but her phosphorus is high.  Problem # 6 sleep apnea:  Not on CPAP Problem #7 morbid obesity Problem #8 diabetes: Her blood sugar is better controlled today. Problem #9  elevated LFTs: Possibly ischemic. Continuously improving. Problem #10 nausea and vomiting. Ultrasound of the liver didn't show any acute cholecystitis. Hence at this moment etiology not clear but some nausea vomiting becomes a recurrent issue. Plan : 1] patient doesn't need dialysis today  2]  her next dialysis will be on Monday 3]We'll check her CBC, phosphorus and comprehensive panel in the morning.    LOS: 11 days   [name] S 01/29/2017,8:56 AM

## 2017-01-30 LAB — GLUCOSE, CAPILLARY
GLUCOSE-CAPILLARY: 269 mg/dL — AB (ref 65–99)
GLUCOSE-CAPILLARY: 188 mg/dL — AB (ref 65–99)
Glucose-Capillary: 199 mg/dL — ABNORMAL HIGH (ref 65–99)
Glucose-Capillary: 160 mg/dL — ABNORMAL HIGH (ref 65–99)

## 2017-01-30 LAB — CBC
HEMATOCRIT: 27 % — AB (ref 36.0–46.0)
Hemoglobin: 9.1 g/dL — ABNORMAL LOW (ref 12.0–15.0)
MCH: 32.2 pg (ref 26.0–34.0)
MCHC: 33.7 g/dL (ref 30.0–36.0)
MCV: 95.4 fL (ref 78.0–100.0)
PLATELETS: 190 10*3/uL (ref 150–400)
RBC: 2.83 MIL/uL — AB (ref 3.87–5.11)
RDW: 15.7 % — ABNORMAL HIGH (ref 11.5–15.5)
WBC: 14.3 10*3/uL — ABNORMAL HIGH (ref 4.0–10.5)

## 2017-01-30 LAB — PHOSPHORUS: Phosphorus: 5.2 mg/dL — ABNORMAL HIGH (ref 2.5–4.6)

## 2017-01-30 LAB — PROTIME-INR
INR: 2.19
Prothrombin Time: 24.7 seconds — ABNORMAL HIGH (ref 11.4–15.2)

## 2017-01-30 MED ORDER — WARFARIN SODIUM 5 MG PO TABS
5.0000 mg | ORAL_TABLET | Freq: Once | ORAL | Status: AC
  Administered 2017-01-30: 5 mg via ORAL
  Filled 2017-01-30: qty 1

## 2017-01-30 NOTE — Progress Notes (Addendum)
Subjective: Interval History: patient is feeling much better. She didn't have any nausea or vomiting today. She was able to eat her breakfast. Patient also denies any difficulty breathing.  Objective: Vital signs in last 24 hours: Temp:  [98 F (36.7 C)-99 F (37.2 C)] 98.2 F (36.8 C) (02/11 0400) Pulse Rate:  [71-87] 73 (02/11 0700) Resp:  [14-19] 17 (02/11 0700) BP: (118-145)/(65-74) 145/70 (02/11 0700) SpO2:  [96 %-100 %] 99 % (02/11 0700) Weight change:   Intake/Output from previous day: 02/10 0701 - 02/11 0700 In: 1471 [P.O.:1200; I.V.:55; IV Piggyback:216] Out: -  Intake/Output this shift: No intake/output data recorded.  General appearance: alert, cooperative and no distress Resp: diminished breath sounds anterior - bilateral and wheezes bilaterally Cardio: regular rate and rhythm Extremities: edema Patient with venous stasis and chronic leg edema  Lab Results:  Recent Labs  01/29/17 0440 01/30/17 0400  WBC 19.4* 14.3*  HGB 9.8* 9.1*  HCT 29.0* 27.0*  PLT 187 190   BMET:   Recent Labs  01/28/17 0457 01/29/17 0440  NA 132* 135  K 3.5 3.4*  CL 95* 96*  CO2 27 28  GLUCOSE 229* 159*  BUN 59* 36*  CREATININE 5.25* 3.96*  CALCIUM 7.8* 8.5*   No results for input(s): PTH in the last 72 hours. Iron Studies: No results for input(s): IRON, TIBC, TRANSFERRIN, FERRITIN in the last 72 hours.  Studies/Results: No results found.  I have reviewed the patient's current medications.  Assessment/Plan: Problem #1 sepsis: Patient continued to be on antibiotics . She is a febrile and her white blood cell count is improving. Problem #2 end-stage renal disease:She is status post hemodialysis on Friday. She is due for dialysis tomorrow which is her regular scheduled. Problem #3 anemia: Her hemoglobin slightly low but stable. Patient is on Epogen with dialysis. Problem #4 fluid management: She doesn't have significant signs of fluid overload and she is  asymptomatic. Problem #5 metabolic bone disease: Calcium is in range . Her phosphorus also has improved and presently is within our target range. Problem # 6 sleep apnea: Not on CPAP Problem #7 morbid obesity Problem #8 diabetes: Her blood sugar is better controlled today. Problem #9 elevated LFTs: Possibly ischemic. Continuously improving. Problem #10 nausea and vomiting. Has improved                                   Plan : 1] patient doesn't need dialysis today                                              2]  her next dialysis will in a.m.                                              3]We will dialyze patient for 4 hours tomorrow.                                             4] will check a CBC and renal panel in the morning.    LOS: 12 days  Kimla Furth S 01/30/2017,8:23 AM

## 2017-01-30 NOTE — Progress Notes (Signed)
[name] for coumadin Indication: cor pulmonale, r/o PE  Allergies  Allergen Reactions  . Contrast Media [Iodinated Diagnostic Agents] Anaphylaxis, Hives, Swelling and Other (See Comments)    Dye for cardiac cath. Tongue swells  . Pneumococcal Vaccines Swelling    Turns skin black, and bodily swelling  . Shellfish Allergy Itching, Swelling and Other (See Comments)    Facial swelling - Pt able to eat now 11/25/16  . Vancomycin Nausea And Vomiting and Other (See Comments)    Infusion "made me feel like I was dying" had to be readmitted to hospital   Patient Measurements: Height: _0  (165.1 cm) Weight: (!) 324 lb 4.8 oz (147.1 kg) IBW/kg (Calculated) : 57 Heparin Dosing Weight: 93 kg  Vital Signs: Temp: 98.2 F (36.8 C) (02/11 0400) Temp Source: Oral (02/11 0400) BP: 145/70 (02/11 0700) Pulse Rate: 73 (02/11 0700)  Labs:  Recent Labs  01/27/17 1257  01/28/17 0457 01/29/17 0440 01/30/17 0400  HGB  --   < > 9.4* 9.8* 9.1*  HCT  --   --  27.4* 29.0* 27.0*  PLT  --   --  190 187 190  LABPROT  --   --  23.6* 22.4* 24.7*  INR  --   --  2.07 1.94 2.19  HEPARINUNFRC 0.85*  --  0.15*  --   --   CREATININE  --   --  5.25* 3.96*  --   < > = values in this interval not displayed. Estimated Creatinine Clearance: 25 mL/min (by C-G formula based on SCr of 3.96 mg/dL (H)).  Medical History: Past Medical History:  Diagnosis Date  . Anxiety   . Asthma    as a child  . Cancer ([place])    thyroid  . Cellulitis   . CHF (congestive heart failure) ([place])   . Chiari malformation    s/p surgery  . Chiari malformation   . Chronic pain   . Complication of anesthesia 11/28/15   Resp arrest after  conscious  sedation  . Constipation   . Coronary artery disease    40-50% mid LAD 04/29/09, Medical tx. (Dr. Gwenlyn Found)  . Diabetes mellitus    Type II  . Fibromyalgia   . History of blood transfusion    hemorrage duinrg pregancy  . Hx of echocardiogram  10/2011   EF 55-60%  . Hypercholesterolemia   . Hypertension   . Lymph edema   . Obesity hypoventilation syndrome ([place])   . On home O2   . Pneumonia    in past  . Renal disorder    Pt started dialysis in Dec.2016  . S/P colonoscopy 05/26/2007   Dr. Laural Golden sigmoid diverticulosis random biopsies benign  . S/P endoscopy 05/01/2009   Dr. Penelope Coop pill-induced esophageal ulcerations distal to midesophagus, 2 small ulcers in the antrum of the stomach  . Shortness of breath dyspnea    with any exertion or if heart rate  is irregular while on dialysis  . Sleep apnea    Medications:  Prescriptions Prior to Admission  Medication Sig Dispense Refill Last Dose  . acetaminophen (TYLENOL) 325 MG tablet Take 650 mg by mouth every 6 (six) hours as needed for moderate pain or fever.    01/17/2017 at Unknown time  . albuterol (PROVENTIL) (2.5 MG/3ML) 0.083% nebulizer solution Take 2.5 mg by nebulization every 6 (six) hours as needed for wheezing or shortness of breath.   Past Month at Unknown time  .  ALPRAZolam (XANAX) 0.5 MG tablet Take 0.5 mg by mouth 3 (three) times daily as needed for anxiety or sleep.    01/17/2017 at Unknown time  . amLODipine (NORVASC) 10 MG tablet Take 0.5 tablets (5 mg total) by mouth daily. 15 tablet 12 01/17/2017 at Unknown time  . cinacalcet (SENSIPAR) 30 MG tablet Take 30 mg by mouth daily.   01/17/2017 at Unknown time  . diphenhydrAMINE (BENADRYL) 25 mg capsule Take 25 mg by mouth every 6 (six) hours as needed for allergies.   unknown  . Insulin Glargine (LANTUS SOLOSTAR) 100 UNIT/ML Solostar Pen Inject 10 Units into the skin daily at 10 pm. 15 mL 11 01/17/2017 at Unknown time  . metoprolol (LOPRESSOR) 50 MG tablet Take 0.5 tablets (25 mg total) by mouth 2 (two) times daily. 30 tablet 12 01/17/2017 at Unknown time  . multivitamin (RENA-VIT) TABS tablet Take 1 tablet by mouth daily.   01/17/2017 at Unknown time  . Oxycodone HCl 10 MG TABS Take 1 tablet (10 mg total) by mouth 3  (three) times daily as needed (pain). (Patient taking differently: Take 10 mg by mouth 4 (four) times daily as needed (pain). ) 15 tablet 0 unknown  . sevelamer carbonate (RENVELA) 800 MG tablet Take 1,600-3,200 mg by mouth See admin instructions. Take 3200 mg by mouth 3 times daily with meals and take 1600 mg by mouth with snacks.   01/17/2017 at Unknown time  . doxycycline (VIBRAMYCIN) 50 MG capsule Take 2 capsules (100 mg total) by mouth 2 (two) times daily. (Patient not taking: Reported on 01/18/2017) 20 capsule 0 Not Taking at Unknown time  . predniSONE (STERAPRED UNI-PAK 21 TAB) 10 MG (21) TBPK tablet Take by package instructions (Patient not taking: Reported on 01/18/2017) 21 tablet 0 Not Taking at Unknown time   Assessment: 51 yo lady was started heparin for cor pulmonale, r/o PE. Ventilation/perfusion lung scan is reported as very low probability for PE. INR is therapeutic, so heparin was discontinued.  INR  2.19 today . No bleeding noted.   Goal of Therapy:  INR 2-3 Monitor platelets by anticoagulation protocol: Yes    Plan:  Coumadin  74m today x 1 Daily Pt-INR Monitor for bleeding complications  Thanks for allowing pharmacy to be a part of this patient's care.  LIsac Sarna BS PVena [place] BCPS Clinical Pharmacist Pager #[phone]/10/2017,9:57 AM

## 2017-01-30 NOTE — Progress Notes (Signed)
Pt moved to chair for 2 hours with the assistance of 3 people. Pt placed back in bed

## 2017-01-30 NOTE — Progress Notes (Signed)
Subjective: Patient feels better. She is improving.[name] Her appetite is better. No fever or chills. Her mental status improved. Her cellulitis also improving.  Objective: Vital signs in last 24 hours: Temp:  [98 F (36.7 C)-99 F (37.2 C)] 98.2 F (36.8 C) (02/11 0400) Pulse Rate:  [71-87] 81 (02/11 1000) Resp:  [14-19] 14 (02/11 1000) BP: (118-145)/(65-74) 126/69 (02/11 1000) SpO2:  [96 %-100 %] 98 % (02/11 1000) Weight change:  Last BM Date: 01/27/17  Intake/Output from previous day: 02/10 0701 - 02/11 0700 In: 1471 [P.O.:1200; I.V.:55; IV Piggyback:216] Out: -   PHYSICAL EXAM General appearance: alert and moderate distress Resp: clear to auscultation bilaterally Cardio: S1, S2 normal GI: soft, non-tender; bowel sounds normal; no masses,  no organomegaly Extremities: chronic bilateral lymphedema  Lab Results:  Results for orders placed or performed during the hospital encounter of 01/18/17 (from the past 48 hour(s))  Glucose, capillary     Status: Abnormal   Collection Time: 01/28/17 11:27 AM  Result Value Ref Range   Glucose-Capillary 324 (H) 65 - 99 mg/dL  Glucose, capillary     Status: Abnormal   Collection Time: 01/28/17  6:38 PM  Result Value Ref Range   Glucose-Capillary 140 (H) 65 - 99 mg/dL  Glucose, capillary     Status: Abnormal   Collection Time: 01/28/17  9:19 PM  Result Value Ref Range   Glucose-Capillary 136 (H) 65 - 99 mg/dL   Comment 1 Notify RN    Comment 2 Document in Chart   CBC     Status: Abnormal   Collection Time: 01/29/17  4:40 AM  Result Value Ref Range   WBC 19.4 (H) 4.0 - 10.5 K/uL   RBC 3.05 (L) 3.87 - 5.11 MIL/uL   Hemoglobin 9.8 (L) 12.0 - 15.0 g/dL   HCT 29.0 (L) 36.0 - 46.0 %   MCV 95.1 78.0 - 100.0 fL   MCH 32.1 26.0 - 34.0 pg   MCHC 33.8 30.0 - 36.0 g/dL   RDW 15.8 (H) 11.5 - 15.5 %   Platelets 187 150 - 400 K/uL  Protime-INR     Status: Abnormal   Collection Time: 01/29/17  4:40 AM  Result Value Ref Range   Prothrombin Time  22.4 (H) 11.4 - 15.2 seconds   INR 1.94   Comprehensive metabolic panel     Status: Abnormal   Collection Time: 01/29/17  4:40 AM  Result Value Ref Range   Sodium 135 135 - 145 mmol/L   Potassium 3.4 (L) 3.5 - 5.1 mmol/L   Chloride 96 (L) 101 - 111 mmol/L   CO2 28 22 - 32 mmol/L   Glucose, Bld 159 (H) 65 - 99 mg/dL   BUN 36 (H) 6 - 20 mg/dL   Creatinine, Ser 3.96 (H) 0.44 - 1.00 mg/dL   Calcium 8.5 (L) 8.9 - 10.3 mg/dL   Total Protein 6.5 6.5 - 8.1 g/dL   Albumin 2.5 (L) 3.5 - 5.0 g/dL   AST 26 15 - 41 U/L   ALT 286 (H) 14 - 54 U/L   Alkaline Phosphatase 170 (H) 38 - 126 U/L   Total Bilirubin 0.9 0.3 - 1.2 mg/dL   GFR calc non Af Amer 12 (L) >60 mL/min   GFR calc Af Amer 14 (L) >60 mL/min    Comment: (NOTE) The eGFR has been calculated using the CKD EPI equation. This calculation has not been validated in all clinical situations. eGFR's persistently <60 mL/min signify possible Chronic Kidney Disease.  Anion gap 11 5 - 15  Glucose, capillary     Status: Abnormal   Collection Time: 01/29/17  7:36 AM  Result Value Ref Range   Glucose-Capillary 155 (H) 65 - 99 mg/dL  Glucose, capillary     Status: Abnormal   Collection Time: 01/29/17 11:35 AM  Result Value Ref Range   Glucose-Capillary 139 (H) 65 - 99 mg/dL  Glucose, capillary     Status: Abnormal   Collection Time: 01/29/17  4:44 PM  Result Value Ref Range   Glucose-Capillary 176 (H) 65 - 99 mg/dL   Comment 1 Notify RN   Glucose, capillary     Status: Abnormal   Collection Time: 01/29/17  9:01 PM  Result Value Ref Range   Glucose-Capillary 220 (H) 65 - 99 mg/dL  CBC     Status: Abnormal   Collection Time: 01/30/17  4:00 AM  Result Value Ref Range   WBC 14.3 (H) 4.0 - 10.5 K/uL   RBC 2.83 (L) 3.87 - 5.11 MIL/uL   Hemoglobin 9.1 (L) 12.0 - 15.0 g/dL   HCT 27.0 (L) 36.0 - 46.0 %   MCV 95.4 78.0 - 100.0 fL   MCH 32.2 26.0 - 34.0 pg   MCHC 33.7 30.0 - 36.0 g/dL   RDW 15.7 (H) 11.5 - 15.5 %   Platelets 190 150 - 400  K/uL  Protime-INR     Status: Abnormal   Collection Time: 01/30/17  4:00 AM  Result Value Ref Range   Prothrombin Time 24.7 (H) 11.4 - 15.2 seconds   INR 2.19   Phosphorus     Status: Abnormal   Collection Time: 01/30/17  4:00 AM  Result Value Ref Range   Phosphorus 5.2 (H) 2.5 - 4.6 mg/dL  Glucose, capillary     Status: Abnormal   Collection Time: 01/30/17  8:02 AM  Result Value Ref Range   Glucose-Capillary 199 (H) 65 - 99 mg/dL   Comment 1 Notify RN    Comment 2 Document in Chart     ABGS No results for input(s): PHART, PO2ART, TCO2, HCO3 in the last 72 hours.  Invalid input(s): PCO2 CULTURES Recent Results (from the past 240 hour(s))  Culture, blood (Routine X 2) w Reflex to ID Panel     Status: None (Preliminary result)   Collection Time: 01/27/17  7:41 AM  Result Value Ref Range Status   Specimen Description BLOOD RIGHT ARM  Final   Special Requests   Final    BOTTLES DRAWN AEROBIC AND ANAEROBIC  8 CC EACH BOTTLE   Culture NO GROWTH < 24 HOURS  Final   Report Status PENDING  Incomplete  Culture, blood (Routine X 2) w Reflex to ID Panel     Status: None (Preliminary result)   Collection Time: 01/27/17  7:50 AM  Result Value Ref Range Status   Specimen Description BLOOD CENTRAL LINE  Final   Special Requests BOTTLES DRAWN AEROBIC ONLY 8 CC  Final   Culture NO GROWTH < 24 HOURS  Final   Report Status PENDING  Incomplete   Studies/Results: No results found.  Medications: I have reviewed the patient's current medications.  Assesment:  Principal Problem:   Fever Active Problems:   Diabetes mellitus ([place])   Morbid obesity (H[facility])   Obesity hypoventilation syndrome (H[facility])   Recurrent cellulitis of lower leg   Chronic diastolic CHF (congestive heart failure) (H[facility])   Chronic respiratory failure with hypoxia (H[facility])   ESRD on dialysis ([facility])   Flu-like  symptoms   Elevated troponin   Septic shock (H[facility])   Shock liver   Cor pulmonale, chronic (HCC)    Plan:   Medications reviewed Continue current treatment Hemodialysis as per nephrology Supportive care     LOS: 12 days   Eluzer Howdeshell 01/30/2017, 10:45 AM

## 2017-01-31 DIAGNOSIS — I50812 Chronic right heart failure: Secondary | ICD-10-CM

## 2017-01-31 LAB — GLUCOSE, CAPILLARY
GLUCOSE-CAPILLARY: 193 mg/dL — AB (ref 65–99)
Glucose-Capillary: 162 mg/dL — ABNORMAL HIGH (ref 65–99)
Glucose-Capillary: 147 mg/dL — ABNORMAL HIGH (ref 65–99)
Glucose-Capillary: 174 mg/dL — ABNORMAL HIGH (ref 65–99)

## 2017-01-31 LAB — CBC
HCT: 27 % — ABNORMAL LOW (ref 36.0–46.0)
Hemoglobin: 9.1 g/dL — ABNORMAL LOW (ref 12.0–15.0)
MCH: 32 pg (ref 26.0–34.0)
MCHC: 33.7 g/dL (ref 30.0–36.0)
MCV: 95.1 fL (ref 78.0–100.0)
PLATELETS: 202 10*3/uL (ref 150–400)
RBC: 2.84 MIL/uL — ABNORMAL LOW (ref 3.87–5.11)
RDW: 15.8 % — AB (ref 11.5–15.5)
WBC: 12.3 10*3/uL — ABNORMAL HIGH (ref 4.0–10.5)

## 2017-01-31 LAB — RENAL FUNCTION PANEL
ANION GAP: 12 (ref 5–15)
Albumin: 2.2 g/dL — ABNORMAL LOW (ref 3.5–5.0)
BUN: 48 mg/dL — AB (ref 6–20)
CHLORIDE: 94 mmol/L — AB (ref 101–111)
CO2: 26 mmol/L (ref 22–32)
Calcium: 8 mg/dL — ABNORMAL LOW (ref 8.9–10.3)
Creatinine, Ser: 5.55 mg/dL — ABNORMAL HIGH (ref 0.44–1.00)
GFR calc Af Amer: 9 mL/min — ABNORMAL LOW (ref >60)
GFR calc non Af Amer: 8 mL/min — ABNORMAL LOW (ref >60)
GLUCOSE: 173 mg/dL — AB (ref 65–99)
POTASSIUM: 3.4 mmol/L — AB (ref 3.5–5.1)
Phosphorus: 4.7 mg/dL — ABNORMAL HIGH (ref 2.5–4.6)
Sodium: 132 mmol/L — ABNORMAL LOW (ref 135–145)

## 2017-01-31 LAB — PROTIME-INR
INR: 1.98
PROTHROMBIN TIME: 22.8 s — AB (ref 11.4–15.2)

## 2017-01-31 MED ORDER — SODIUM CHLORIDE 0.9 % IV SOLN: 100.0000 mL | INTRAVENOUS | Status: DC | PRN

## 2017-01-31 MED ORDER — LIDOCAINE HCL (PF) 1 % IJ SOLN: 5.0000 mL | INTRAMUSCULAR | Status: DC | PRN

## 2017-01-31 MED ORDER — LIDOCAINE-PRILOCAINE 2.5-2.5 % EX CREA
1.0000 "application " | TOPICAL_CREAM | CUTANEOUS | Status: DC | PRN
  Filled 2017-01-31: qty 5

## 2017-01-31 MED ORDER — PENTAFLUOROPROP-TETRAFLUOROETH EX AERO
1.0000 "application " | INHALATION_SPRAY | CUTANEOUS | Status: DC | PRN
  Filled 2017-01-31: qty 30

## 2017-01-31 MED ORDER — WARFARIN SODIUM 7.5 MG PO TABS
7.5000 mg | ORAL_TABLET | Freq: Once | ORAL | Status: AC
  Administered 2017-01-31: 7.5 mg via ORAL
  Filled 2017-01-31: qty 1

## 2017-01-31 MED ORDER — EPOETIN ALFA 4000 UNIT/ML IJ SOLN
INTRAMUSCULAR | Status: AC
  Administered 2017-01-31: 8000 [IU] via INTRAVENOUS
  Filled 2017-01-31: qty 2

## 2017-01-31 NOTE — Progress Notes (Signed)
Physical Therapy Treatment Patient Details Name: [name] MRN: [id number] DOB: [birth year] Today's Date: 01/31/2017    History of Present Illness 51 yo F was admitted with cellulitis of her left leg. This is in the leg that has had previous episodes of cellulitis and she has bilateral severe lymphedema at baseline. She will had sepsis with septic shock.  Cor pulmonale with severe RV dysfunction and pulmonary hypertension, PA SP 85 mmHg by echocardiogram. Although not proven by VQ scan or venous Dopplers, pulmonary thromboembolic disease has been a concern and Coumadin initiated.  Elevated troponin I up to 5.3. No active chest pain. Demand ischemia has been suspected based on chart review, NSTEMI not excluded    PT Comments    Pt received in bed, and was agreeable to PT tx.  Pt required Max A +2 person for SPT from the bed<>chair. Further mobility is limited due to weakness, as well as increased pain in LE's.   Continue to recommend SNF at this time.     Follow Up Recommendations  SNF     Equipment Recommendations  None recommended by PT    Recommendations for Other Services OT consult     Precautions / Restrictions Precautions Precautions: Fall Precaution Comments: due to immobility Restrictions Weight Bearing Restrictions: No    Mobility  Bed Mobility   Bed Mobility: Supine to Sit;Sit to Supine     Supine to sit: Max assist;HOB elevated     General bed mobility comments: +2 person assist to advance LE's to the EOB.    Transfers Overall transfer level: Needs assistance Equipment used: None Transfers: Stand Pivot Transfers;Sit to/from Stand Sit to Stand: Max assist;+2 physical assistance;From elevated surface Stand pivot transfers: Max assist;+2 physical assistance;From elevated surface       General transfer comment: Pt was able to stand and pivot from the bed<>chair. maxi move sling placed underneath the patient in the chair.     Ambulation/Gait Ambulation/Gait assistance:  (NA)               Stairs            Wheelchair Mobility    Modified Rankin (Stroke Patients Only)       Balance Overall balance assessment: Needs assistance Sitting-balance support: Bilateral upper extremity supported;Feet supported Sitting balance-Leahy Scale: Fair Sitting balance - Comments: Pt required support of B UE's on EOB.    Standing balance support: Bilateral upper extremity supported Standing balance-Leahy Scale: Poor                      Cognition Arousal/Alertness: Awake/alert Behavior During Therapy: WFL for tasks assessed/performed Overall Cognitive Status: Within Functional Limits for tasks assessed                      Exercises      General Comments        Pertinent Vitals/Pain Pain Assessment: Faces Faces Pain Scale: Hurts even more Pain Location: L LE and foot Pain Descriptors / Indicators: Aching Pain Intervention(s): Limited activity within patient's tolerance;Monitored during session;Repositioned    Home Living                      Prior Function            PT Goals (current goals can now be found in the care plan section) Acute Rehab PT Goals Patient Stated Goal: To get stronger and be able to move like she was  before.  PT Goal Formulation: With patient Time For Goal Achievement: 02/10/17 Progress towards PT goals: Progressing toward goals    Frequency    Min 3X/week      PT Plan Current plan remains appropriate    Co-evaluation             End of Session Equipment Utilized During Treatment: Gait belt Activity Tolerance: Patient tolerated treatment well Patient left: in chair;with call bell/phone within reach;with family/visitor present;with nursing/sitter in room     Time: 1510-1538 PT Time Calculation (min) (ACUTE ONLY): 28 min  Charges:  $Therapeutic Activity: 23-37 mins                    G Codes:      Beth Turkessa Ostrom,  PT, DPT X: [phone]

## 2017-01-31 NOTE — Progress Notes (Signed)
[place] for coumadin Indication: cor pulmonale, r/o PE  Allergies  Allergen Reactions  . Contrast Media [Iodinated Diagnostic Agents] Anaphylaxis, Hives, Swelling and Other (See Comments)    Dye for cardiac cath. Tongue swells  . Pneumococcal Vaccines Swelling    Turns skin black, and bodily swelling  . Shellfish Allergy Itching, Swelling and Other (See Comments)    Facial swelling - Pt able to eat now 11/25/16  . Vancomycin Nausea And Vomiting and Other (See Comments)    Infusion "made me feel like I was dying" had to be readmitted to hospital   Patient Measurements: Height: _0  (165.1 cm) Weight: (!) 329 lb 9.4 oz (149.5 kg) IBW/kg (Calculated) : 57 Heparin Dosing Weight: 93 kg  Vital Signs: Temp: 98.4 F (36.9 C) (02/12 0813) Temp Source: Oral (02/12 0813) BP: 134/77 (02/12 0600) Pulse Rate: 107 (02/12 0813)  Labs:  Recent Labs  01/29/17 0440 01/30/17 0400 01/31/17 0445  HGB 9.8* 9.1* 9.1*  HCT 29.0* 27.0* 27.0*  PLT 187 190 202  LABPROT 22.4* 24.7* 22.8*  INR 1.94 2.19 1.98  CREATININE 3.96*  --  5.55*   Estimated Creatinine Clearance: 18 mL/min (by C-G formula based on SCr of 5.55 mg/dL (H)).  Medical History: Past Medical History:  Diagnosis Date  . Anxiety   . Asthma    as a child  . Cancer ([place])    thyroid  . Cellulitis   . CHF (congestive heart failure) ([place])   . Chiari malformation    s/p surgery  . Chiari malformation   . Chronic pain   . Complication of anesthesia 11/28/15   Resp arrest after  conscious  sedation  . Constipation   . Coronary artery disease    40-50% mid LAD 04/29/09, Medical tx. (Dr. Gwenlyn Found)  . Diabetes mellitus    Type II  . Fibromyalgia   . History of blood transfusion    hemorrage duinrg pregancy  . Hx of echocardiogram 10/2011   EF 55-60%  . Hypercholesterolemia   . Hypertension   . Lymph edema   . Obesity hypoventilation syndrome ([place])   . On home O2   . Pneumonia    in past  . Renal disorder    Pt started dialysis in Dec.2016  . S/P colonoscopy 05/26/2007   Dr. Laural Golden sigmoid diverticulosis random biopsies benign  . S/P endoscopy 05/01/2009   Dr. Penelope Coop pill-induced esophageal ulcerations distal to midesophagus, 2 small ulcers in the antrum of the stomach  . Shortness of breath dyspnea    with any exertion or if heart rate  is irregular while on dialysis  . Sleep apnea    Medications:  Prescriptions Prior to Admission  Medication Sig Dispense Refill Last Dose  . acetaminophen (TYLENOL) 325 MG tablet Take 650 mg by mouth every 6 (six) hours as needed for moderate pain or fever.    01/17/2017 at Unknown time  . albuterol (PROVENTIL) (2.5 MG/3ML) 0.083% nebulizer solution Take 2.5 mg by nebulization every 6 (six) hours as needed for wheezing or shortness of breath.   Past Month at Unknown time  . ALPRAZolam (XANAX) 0.5 MG tablet Take 0.5 mg by mouth 3 (three) times daily as needed for anxiety or sleep.    01/17/2017 at Unknown time  . amLODipine (NORVASC) 10 MG tablet Take 0.5 tablets (5 mg total) by mouth daily. 15 tablet 12 01/17/2017 at Unknown time  . cinacalcet (SENSIPAR) 30 MG tablet Take 30 mg by  mouth daily.   01/17/2017 at Unknown time  . diphenhydrAMINE (BENADRYL) 25 mg capsule Take 25 mg by mouth every 6 (six) hours as needed for allergies.   unknown  . Insulin Glargine (LANTUS SOLOSTAR) 100 UNIT/ML Solostar Pen Inject 10 Units into the skin daily at 10 pm. 15 mL 11 01/17/2017 at Unknown time  . metoprolol (LOPRESSOR) 50 MG tablet Take 0.5 tablets (25 mg total) by mouth 2 (two) times daily. 30 tablet 12 01/17/2017 at Unknown time  . multivitamin (RENA-VIT) TABS tablet Take 1 tablet by mouth daily.   01/17/2017 at Unknown time  . Oxycodone HCl 10 MG TABS Take 1 tablet (10 mg total) by mouth 3 (three) times daily as needed (pain). (Patient taking differently: Take 10 mg by mouth 4 (four) times daily as needed (pain). ) 15 tablet 0 unknown  . sevelamer  carbonate (RENVELA) 800 MG tablet Take 1,600-3,200 mg by mouth See admin instructions. Take 3200 mg by mouth 3 times daily with meals and take 1600 mg by mouth with snacks.   01/17/2017 at Unknown time  . doxycycline (VIBRAMYCIN) 50 MG capsule Take 2 capsules (100 mg total) by mouth 2 (two) times daily. (Patient not taking: Reported on 01/18/2017) 20 capsule 0 Not Taking at Unknown time  . predniSONE (STERAPRED UNI-PAK 21 TAB) 10 MG (21) TBPK tablet Take by package instructions (Patient not taking: Reported on 01/18/2017) 21 tablet 0 Not Taking at Unknown time   Assessment: 51 yo lady was started heparin for cor pulmonale, r/o PE. Ventilation/perfusion lung scan is reported as very low probability for PE. INR is therapeutic, so heparin was discontinued.  INR  1.98 today . No bleeding noted.   Goal of Therapy:  INR 2-3 Monitor platelets by anticoagulation protocol: Yes    Plan:  Coumadin 7.79m today x 1 Daily Pt-INR Monitor for bleeding complications  Thanks for allowing pharmacy to be a part of this patient's care.  LIsac Sarna BS PVena [place] BCPS Clinical Pharmacist Pager #[phone]/11/2017,9:05 AM

## 2017-01-31 NOTE — Progress Notes (Signed)
Subjective: Patient is improving. She is getting hemodialysis. Her appetite is better. Objective: Vital signs in last 24 hours: Temp:  [98 F (36.7 C)-99.2 F (37.3 C)] 98.3 F (36.8 C) (02/12 0400) Pulse Rate:  [71-91] 79 (02/12 0600) Resp:  [13-23] 19 (02/12 0600) BP: (96-153)/(49-112) 134/77 (02/12 0600) SpO2:  [89 %-100 %] 94 % (02/12 0600) Weight:  [149.5 kg (329 lb 9.4 oz)] 149.5 kg (329 lb 9.4 oz) (02/12 0500) Weight change:  Last BM Date: 01/27/17  Intake/Output from previous day: 02/11 0701 - 02/12 0700 In: 1341 [P.O.:1100; I.V.:25; IV Piggyback:216] Out: 200 [Urine:200]  PHYSICAL EXAM General appearance: alert and moderate distress Resp: clear to auscultation bilaterally Cardio: S1, S2 normal GI: soft, non-tender; bowel sounds normal; no masses,  no organomegaly Extremities: chronic bilateral lymphedema  Lab Results:  Results for orders placed or performed during the hospital encounter of 01/18/17 (from the past 48 hour(s))  Glucose, capillary     Status: Abnormal   Collection Time: 01/29/17 11:35 AM  Result Value Ref Range   Glucose-Capillary 139 (H) 65 - 99 mg/dL  Glucose, capillary     Status: Abnormal   Collection Time: 01/29/17  4:44 PM  Result Value Ref Range   Glucose-Capillary 176 (H) 65 - 99 mg/dL   Comment 1 Notify RN   Glucose, capillary     Status: Abnormal   Collection Time: 01/29/17  9:01 PM  Result Value Ref Range   Glucose-Capillary 220 (H) 65 - 99 mg/dL  CBC     Status: Abnormal   Collection Time: 01/30/17  4:00 AM  Result Value Ref Range   WBC 14.3 (H) 4.0 - 10.5 K/uL   RBC 2.83 (L) 3.87 - 5.11 MIL/uL   Hemoglobin 9.1 (L) 12.0 - 15.0 g/dL   HCT 27.0 (L) 36.0 - 46.0 %   MCV 95.4 78.0 - 100.0 fL   MCH 32.2 26.0 - 34.0 pg   MCHC 33.7 30.0 - 36.0 g/dL   RDW 15.7 (H) 11.5 - 15.5 %   Platelets 190 150 - 400 K/uL  Protime-INR     Status: Abnormal   Collection Time: 01/30/17  4:00 AM  Result Value Ref Range   Prothrombin Time 24.7 (H)  11.4 - 15.2 seconds   INR 2.19   Phosphorus     Status: Abnormal   Collection Time: 01/30/17  4:00 AM  Result Value Ref Range   Phosphorus 5.2 (H) 2.5 - 4.6 mg/dL  Glucose, capillary     Status: Abnormal   Collection Time: 01/30/17  8:02 AM  Result Value Ref Range   Glucose-Capillary 199 (H) 65 - 99 mg/dL   Comment 1 Notify RN    Comment 2 Document in Chart   Glucose, capillary     Status: Abnormal   Collection Time: 01/30/17 11:35 AM  Result Value Ref Range   Glucose-Capillary 269 (H) 65 - 99 mg/dL  Glucose, capillary     Status: Abnormal   Collection Time: 01/30/17  5:15 PM  Result Value Ref Range   Glucose-Capillary 188 (H) 65 - 99 mg/dL  Glucose, capillary     Status: Abnormal   Collection Time: 01/30/17  9:54 PM  Result Value Ref Range   Glucose-Capillary 160 (H) 65 - 99 mg/dL  CBC     Status: Abnormal   Collection Time: 01/31/17  4:45 AM  Result Value Ref Range   WBC 12.3 (H) 4.0 - 10.5 K/uL   RBC 2.84 (L) 3.87 - 5.11 MIL/uL  Hemoglobin 9.1 (L) 12.0 - 15.0 g/dL   HCT 27.0 (L) 36.0 - 46.0 %   MCV 95.1 78.0 - 100.0 fL   MCH 32.0 26.0 - 34.0 pg   MCHC 33.7 30.0 - 36.0 g/dL   RDW 15.8 (H) 11.5 - 15.5 %   Platelets 202 150 - 400 K/uL  Protime-INR     Status: Abnormal   Collection Time: 01/31/17  4:45 AM  Result Value Ref Range   Prothrombin Time 22.8 (H) 11.4 - 15.2 seconds   INR 1.98   Renal function panel     Status: Abnormal   Collection Time: 01/31/17  4:45 AM  Result Value Ref Range   Sodium 132 (L) 135 - 145 mmol/L   Potassium 3.4 (L) 3.5 - 5.1 mmol/L   Chloride 94 (L) 101 - 111 mmol/L   CO2 26 22 - 32 mmol/L   Glucose, Bld 173 (H) 65 - 99 mg/dL   BUN 48 (H) 6 - 20 mg/dL   Creatinine, Ser 5.55 (H) 0.44 - 1.00 mg/dL   Calcium 8.0 (L) 8.9 - 10.3 mg/dL   Phosphorus 4.7 (H) 2.5 - 4.6 mg/dL   Albumin 2.2 (L) 3.5 - 5.0 g/dL   GFR calc non Af Amer 8 (L) >60 mL/min   GFR calc Af Amer 9 (L) >60 mL/min    Comment: (NOTE) The eGFR has been calculated using the  CKD EPI equation. This calculation has not been validated in all clinical situations. eGFR's persistently <60 mL/min signify possible Chronic Kidney Disease.    Anion gap 12 5 - 15    ABGS No results for input(s): PHART, PO2ART, TCO2, HCO3 in the last 72 hours.  Invalid input(s): PCO2 CULTURES Recent Results (from the past 240 hour(s))  Culture, blood (Routine X 2) w Reflex to ID Panel     Status: None (Preliminary result)   Collection Time: 01/27/17  7:41 AM  Result Value Ref Range Status   Specimen Description BLOOD RIGHT ARM  Final   Special Requests   Final    BOTTLES DRAWN AEROBIC AND ANAEROBIC  8 CC EACH BOTTLE   Culture NO GROWTH < 24 HOURS  Final   Report Status PENDING  Incomplete  Culture, blood (Routine X 2) w Reflex to ID Panel     Status: None (Preliminary result)   Collection Time: 01/27/17  7:50 AM  Result Value Ref Range Status   Specimen Description BLOOD CENTRAL LINE  Final   Special Requests BOTTLES DRAWN AEROBIC ONLY 8 CC  Final   Culture NO GROWTH < 24 HOURS  Final   Report Status PENDING  Incomplete   Studies/Results: No results found.  Medications: I have reviewed the patient's current medications.  Assesment:  Principal Problem:   Fever Active Problems:   Diabetes mellitus ([place])   Morbid obesity ([place])   Obesity hypoventilation syndrome (HCC)   Recurrent cellulitis of lower leg   Chronic diastolic CHF (congestive heart failure) (HCC)   Chronic respiratory failure with hypoxia (HCC)   ESRD on dialysis (HCC)   Flu-like symptoms   Elevated troponin   Septic shock (HCC)   Shock liver   Cor pulmonale, chronic ([place])    Plan:  Medications reviewed Continue current treatment Move patient from ICU to the floor. Hemodialysis as per nephrology Supportive care     LOS: 13 days   Lyzbeth Genrich 01/31/2017, 8:04 AM

## 2017-01-31 NOTE — Progress Notes (Signed)
Subjective: Interval History: Patient states that she started having nausea vomiting yesterday. She has another episode this morning. She doesn't have any abdominal pain. Patient also denies any difficulty breathing.  Objective: Vital signs in last 24 hours: Temp:  [98 F (36.7 C)-99.2 F (37.3 C)] 98.4 F (36.9 C) (02/12 0813) Pulse Rate:  [71-107] 107 (02/12 0813) Resp:  [13-23] 22 (02/12 0813) BP: (96-153)/(49-112) 134/77 (02/12 0600) SpO2:  [89 %-100 %] 96 % (02/12 0813) Weight:  [149.5 kg (329 lb 9.4 oz)] 149.5 kg (329 lb 9.4 oz) (02/12 0500) Weight change:   Intake/Output from previous day: 02/11 0701 - 02/12 0700 In: 1341 [P.O.:1100; I.V.:25; IV Piggyback:216] Out: 200 [Urine:200] Intake/Output this shift: No intake/output data recorded.  General appearance: alert, cooperative and no distress Resp: diminished breath sounds anterior - bilateral and wheezes bilaterally Cardio: regular rate and rhythm Extremities: edema Patient with venous stasis and chronic leg edema  Lab Results:  Recent Labs  01/30/17 0400 01/31/17 0445  WBC 14.3* 12.3*  HGB 9.1* 9.1*  HCT 27.0* 27.0*  PLT 190 202   BMET:   Recent Labs  01/29/17 0440 01/31/17 0445  NA 135 132*  K 3.4* 3.4*  CL 96* 94*  CO2 28 26  GLUCOSE 159* 173*  BUN 36* 48*  CREATININE 3.96* 5.55*  CALCIUM 8.5* 8.0*   No results for input(s): PTH in the last 72 hours. Iron Studies: No results for input(s): IRON, TIBC, TRANSFERRIN, FERRITIN in the last 72 hours.  Studies/Results: No results found.  I have reviewed the patient's current medications.  Assessment/Plan: Problem #1 sepsis: Patient continued to be on antibiotics . She is a febrile and her white blood cell count is Continuously improving. Problem #2 end-stage renal disease:She is status post hemodialysis on Friday. Patient is due for dialysis today. Her potassium remains low. Problem #3 anemia: Her hemoglobin slightly low but stable. Patient is on  Epogen with dialysis. Problem #4 fluid management: She doesn't have significant signs of fluid overload and she is asymptomatic. Problem #5 metabolic bone disease: Calcium and phosphorus is range.. Problem # 6 sleep apnea: Not on CPAP Problem #7 morbid obesity Problem #8 diabetes: Her blood sugar is better controlled today. Problem #9 elevated LFTs: Possibly ischemic. Continuously improving. Problem #10 nausea and vomiting: This seems to be a recurrent problem. Etiology at this moment is not clear. Plan: 1]  will dialyze patient today           2] will use for K/2.5 calcium bath           3] we'll try to move about 2 L his systolic blood pressure remains above 90.                                                 LOS: 13 days   Lindley Hiney S 01/31/2017,8:26 AM

## 2017-01-31 NOTE — Plan of Care (Signed)
Problem: Health Behavior/Discharge Planning: Goal: Ability to manage health-related needs will improve Outcome: Progressing PT IS ACCEPTING OF NEED FOR REHAB AT SNF  Problem: Activity: Goal: Risk for activity intolerance will decrease Outcome: Progressing PT WAS  ABLE TO GET OOB W/ ASSIST X3 TO RECLINER AND UP FOR 2HRS

## 2017-01-31 NOTE — Clinical Social Work Placement (Signed)
CLINICAL SOCIAL WORK PLACEMENT  NOTE  Date:  01/31/2017  Patient Details  Name: [name] MRN: [id number] Date of Birth: 10-Sep-1966  Clinical Social Work is seeking post-discharge placement for this patient at the [place] level of care (*CSW will initial, date and re-position this form in  chart as items are completed):  Yes   Patient/family provided with [place] Work Department's list of facilities offering this level of care within the geographic area requested by the patient (or if unable, by the patient's family).  Yes   Patient/family informed of their freedom to choose among providers that offer the needed level of care, that participate in Medicare, Medicaid or managed care program needed by the patient, have an available bed and are willing to accept the patient.  Yes   Patient/family informed of [facility]'s ownership interest in [facility] and [facility], as well as of the fact that they are under no obligation to receive care at these facilities.  PASRR submitted to EDS on 01/27/17     PASRR number received on 01/27/17     Existing PASRR number confirmed on       FL2 transmitted to all facilities in geographic area requested by pt/family on       FL2 transmitted to all facilities within larger geographic area on       Patient informed that his/her managed care company has contracts with or will negotiate with certain facilities, including the following:        Yes   Patient/family informed of bed offers received.  Patient chooses bed at [facility]     Physician recommends and patient chooses bed at      Patient to be transferred to   on  .  Patient to be transferred to facility by       Patient family notified on   of transfer.  Name of family member notified:        PHYSICIAN       Additional Comment:    _______________________________________________ Ihor Gully, LCSW 01/31/2017,  11:03 AM

## 2017-01-31 NOTE — Progress Notes (Signed)
Subjective:    No compliants this AM  Objective:   Temp:  [98 F (36.7 C)-99.2 F (37.3 C)] 98.3 F (36.8 C) (02/12 0400) Pulse Rate:  [71-91] 79 (02/12 0600) Resp:  [13-23] 19 (02/12 0600) BP: (96-153)/(49-112) 134/77 (02/12 0600) SpO2:  [89 %-100 %] 94 % (02/12 0600) Weight:  [329 lb 9.4 oz (149.5 kg)] 329 lb 9.4 oz (149.5 kg) (02/12 0500) Last BM Date: 01/27/17  Filed Weights   01/28/17 1155 01/29/17 0400 01/31/17 0500  Weight: (!) 326 lb 4.5 oz (148 kg) (!) 324 lb 4.8 oz (147.1 kg) (!) 329 lb 9.4 oz (149.5 kg)    Intake/Output Summary (Last 24 hours) at 01/31/17 0801 Last data filed at 01/31/17 0200  Gross per 24 hour  Intake             1341 ml  Output              200 ml  Net             1141 ml    Telemetry:SR, sinus tach. Short run of NSVT  Exam:  General: NAD  HEENT: sclera clear, throat clear  Resp: CTAB  Cardiac: RRR, no m/r/g, no jvd  GI: abdomen soft, NT, ND  MSK: 1+ bilateral LE edema  Neuro: no focal deficits  Psych: appropriate affect  Lab Results:  Basic Metabolic Panel:  Recent Labs Lab 01/28/17 0457 01/29/17 0440 01/31/17 0445  NA 132* 135 132*  K 3.5 3.4* 3.4*  CL 95* 96* 94*  CO2 _0 GLUCOSE 229* 159* 173*  BUN 59* 36* 48*  CREATININE 5.25* 3.96* 5.55*  CALCIUM 7.8* 8.5* 8.0*    Liver Function Tests:  Recent Labs Lab 01/26/17 0430 01/27/17 0457 01/28/17 0457 01/29/17 0440 01/31/17 0445  AST 102* 47*  --  26  --   ALT 939* 570*  --  286*  --   ALKPHOS 232* 186*  --  170*  --   BILITOT 1.3* 1.1  --  0.9  --   PROT 6.3* 5.8*  --  6.5  --   ALBUMIN 2.5* 2.2* 2.3* 2.5* 2.2*    CBC:  Recent Labs Lab 01/29/17 0440 01/30/17 0400 01/31/17 0445  WBC 19.4* 14.3* 12.3*  HGB 9.8* 9.1* 9.1*  HCT 29.0* 27.0* 27.0*  MCV 95.1 95.4 95.1  PLT 187 190 202    Cardiac Enzymes: No results for input(s): CKTOTAL, CKMB, CKMBINDEX, TROPONINI in the last 168 hours.  BNP: No results for input(s): PROBNP in  the last 8760 hours.  Coagulation:  Recent Labs Lab 01/29/17 0440 01/30/17 0400 01/31/17 0445  INR 1.94 2.19 1.98    ECG:   Medications:   Scheduled Medications: . Chlorhexidine Gluconate Cloth  6 each Topical Daily  . epoetin (EPOGEN/PROCRIT) injection  8,000 Units Intravenous Q M,W,F-HD  . insulin aspart  0-9 Units Subcutaneous TID WC  . mouth rinse  15 mL Mouth Rinse BID  . metoCLOPramide (REGLAN) injection  5 mg Intravenous Q6H  . ondansetron (ZOFRAN) IV  8 mg Intravenous Q6H  . sodium chloride flush  10-40 mL Intracatheter Q12H  . Warfarin - Pharmacist Dosing Inpatient   Does not apply q1800     Infusions: . phenylephrine ([name]-SYNEPHRINE) Adult infusion Stopped (01/26/17 1300)     PRN Medications:  sodium chloride, sodium chloride, acetaminophen, albuterol, ALPRAZolam, alteplase, guaiFENesin-dextromethorphan, heparin, levalbuterol, ondansetron **OR** ondansetron (ZOFRAN) IV, oxyCODONE, sodium chloride flush, technetium tetrofosmin, technetium tetrofosmin  Assessment/Plan   1 Chronic right ventricular failure and pulmonary HTN -  Echo 01/21/17 LVEF 94-76%, grade I diastolic dysfunction, severely dilated RV, systolic function moderately reduced, PASP 85 - from chart review high concern for possible acute PE vs CTEPH. She was emperically started on anticoagulation. She had dye allergy. VQ scan very low probability for PE.  - appears to have been discussions about maintaining anticoagulation due to high risk for future clots. From my view I do not see an indication to continue and would recommend discontinuing anticoagulation at this time. We will see if her other providers agree - pulm HTN could be related to hypoventilation syndrome and history of OSA, perhaps some left sided diastolic dysfunction. Consider RHC as outpatient. I doubt very much she would have an etiology of pulmonary HTN that would benefit from pulmonary vasodilators.   2. Elevated troponin - from  prior notes thought to be demand ischemia in setting of septic shock - echo LVEF hyperdynamic, no significant WMAs - no plans for ischemic testing  3. ESRD  4. Gram negative bacteremia sepsis - abd per primary team.    No additional cardiology inpatient recs at this time, we will sign off. We will arrange outpatient f/u in 2 weeks.    Carlyle Dolly, M.D., F.A.C.C.Patient ID: [name], female   DOB: [birth year], 51 y.o.   MRN: [id number]

## 2017-02-01 ENCOUNTER — Inpatient Hospital Stay: Admission: RE | Admit: 2017-02-01 | Payer: Medicare Other | Source: Ambulatory Visit | Admitting: Pulmonary Disease

## 2017-02-01 ENCOUNTER — Inpatient Hospital Stay
Admission: RE | Admit: 2017-02-01 | Discharge: 2017-02-16 | Disposition: A | Discharge status: Home / Self Care | Payer: Medicare Other | Source: Ambulatory Visit | Attending: Pulmonary Disease | Admitting: Pulmonary Disease

## 2017-02-01 DIAGNOSIS — N186 End stage renal disease: Secondary | ICD-10-CM | POA: Diagnosis not present

## 2017-02-01 DIAGNOSIS — I10 Essential (primary) hypertension: Secondary | ICD-10-CM | POA: Diagnosis not present

## 2017-02-01 DIAGNOSIS — D631 Anemia in chronic kidney disease: Secondary | ICD-10-CM | POA: Diagnosis not present

## 2017-02-01 DIAGNOSIS — E662 Morbid (severe) obesity with alveolar hypoventilation: Secondary | ICD-10-CM | POA: Diagnosis not present

## 2017-02-01 DIAGNOSIS — L0390 Cellulitis, unspecified: Secondary | ICD-10-CM | POA: Diagnosis not present

## 2017-02-01 DIAGNOSIS — Z992 Dependence on renal dialysis: Secondary | ICD-10-CM | POA: Diagnosis not present

## 2017-02-01 DIAGNOSIS — R5081 Fever presenting with conditions classified elsewhere: Secondary | ICD-10-CM | POA: Diagnosis not present

## 2017-02-01 DIAGNOSIS — L03116 Cellulitis of left lower limb: Secondary | ICD-10-CM | POA: Diagnosis not present

## 2017-02-01 DIAGNOSIS — E1122 Type 2 diabetes mellitus with diabetic chronic kidney disease: Secondary | ICD-10-CM | POA: Diagnosis not present

## 2017-02-01 DIAGNOSIS — I890 Lymphedema, not elsewhere classified: Secondary | ICD-10-CM | POA: Diagnosis not present

## 2017-02-01 DIAGNOSIS — I2781 Cor pulmonale (chronic): Secondary | ICD-10-CM | POA: Diagnosis not present

## 2017-02-01 DIAGNOSIS — M6281 Muscle weakness (generalized): Secondary | ICD-10-CM | POA: Diagnosis not present

## 2017-02-01 DIAGNOSIS — A419 Sepsis, unspecified organism: Secondary | ICD-10-CM | POA: Diagnosis not present

## 2017-02-01 DIAGNOSIS — I5032 Chronic diastolic (congestive) heart failure: Secondary | ICD-10-CM | POA: Diagnosis not present

## 2017-02-01 DIAGNOSIS — R279 Unspecified lack of coordination: Secondary | ICD-10-CM | POA: Diagnosis not present

## 2017-02-01 DIAGNOSIS — N2581 Secondary hyperparathyroidism of renal origin: Secondary | ICD-10-CM | POA: Diagnosis not present

## 2017-02-01 DIAGNOSIS — E785 Hyperlipidemia, unspecified: Secondary | ICD-10-CM | POA: Diagnosis not present

## 2017-02-01 DIAGNOSIS — D509 Iron deficiency anemia, unspecified: Secondary | ICD-10-CM | POA: Diagnosis not present

## 2017-02-01 DIAGNOSIS — S46001D Unspecified injury of muscle(s) and tendon(s) of the rotator cuff of right shoulder, subsequent encounter: Secondary | ICD-10-CM | POA: Diagnosis not present

## 2017-02-01 DIAGNOSIS — R1110 Vomiting, unspecified: Secondary | ICD-10-CM | POA: Diagnosis not present

## 2017-02-01 DIAGNOSIS — R110 Nausea: Secondary | ICD-10-CM | POA: Diagnosis not present

## 2017-02-01 DIAGNOSIS — Z1159 Encounter for screening for other viral diseases: Secondary | ICD-10-CM | POA: Diagnosis not present

## 2017-02-01 DIAGNOSIS — J9611 Chronic respiratory failure with hypoxia: Secondary | ICD-10-CM | POA: Diagnosis not present

## 2017-02-01 LAB — CBC
HEMATOCRIT: 27 % — AB (ref 36.0–46.0)
Hemoglobin: 9.1 g/dL — ABNORMAL LOW (ref 12.0–15.0)
MCH: 32.3 pg (ref 26.0–34.0)
MCHC: 33.7 g/dL (ref 30.0–36.0)
MCV: 95.7 fL (ref 78.0–100.0)
Platelets: 202 10*3/uL (ref 150–400)
RBC: 2.82 MIL/uL — ABNORMAL LOW (ref 3.87–5.11)
RDW: 15.5 % (ref 11.5–15.5)
WBC: 8.8 10*3/uL (ref 4.0–10.5)

## 2017-02-01 LAB — CULTURE, BLOOD (ROUTINE X 2)
Culture: NO GROWTH
Culture: NO GROWTH

## 2017-02-01 LAB — GLUCOSE, CAPILLARY
GLUCOSE-CAPILLARY: 195 mg/dL — AB (ref 65–99)
Glucose-Capillary: 248 mg/dL — ABNORMAL HIGH (ref 65–99)

## 2017-02-01 LAB — PROTIME-INR
INR: 1.68
Prothrombin Time: 20 seconds — ABNORMAL HIGH (ref 11.4–15.2)

## 2017-02-01 MED ORDER — METOCLOPRAMIDE HCL 10 MG PO TABS
5.0000 mg | ORAL_TABLET | Freq: Three times a day (TID) | ORAL | Status: DC
  Administered 2017-02-01 (×2): 5 mg via ORAL
  Filled 2017-02-01 (×2): qty 1

## 2017-02-01 MED ORDER — ONDANSETRON HCL 4 MG PO TABS: 4.0000 mg | ORAL_TABLET | Freq: Four times a day (QID) | ORAL | 0 refills | Status: DC | PRN

## 2017-02-01 MED ORDER — PENTAFLUOROPROP-TETRAFLUOROETH EX AERO: 1.0000 "application " | INHALATION_SPRAY | CUTANEOUS | 0 refills | Status: DC | PRN

## 2017-02-01 MED ORDER — METOCLOPRAMIDE HCL 5 MG PO TABS: 5.0000 mg | ORAL_TABLET | Freq: Three times a day (TID) | ORAL | Status: DC

## 2017-02-01 MED ORDER — LIDOCAINE HCL (PF) 1 % IJ SOLN: 5.0000 mL | INTRAMUSCULAR | 0 refills | Status: DC | PRN

## 2017-02-01 MED ORDER — INSULIN ASPART 100 UNIT/ML ~~LOC~~ SOLN: 0.0000 [IU] | Freq: Three times a day (TID) | SUBCUTANEOUS | 11 refills | Status: DC

## 2017-02-01 MED ORDER — OXYCODONE HCL 10 MG PO TABS: 10.0000 mg | ORAL_TABLET | Freq: Four times a day (QID) | ORAL | Status: DC | PRN

## 2017-02-01 MED ORDER — LIDOCAINE-PRILOCAINE 2.5-2.5 % EX CREA: 1.0000 "application " | TOPICAL_CREAM | CUTANEOUS | 0 refills | Status: DC | PRN

## 2017-02-01 MED ORDER — HEPARIN SODIUM (PORCINE) 5000 UNIT/ML IJ SOLN
5000.0000 [IU] | Freq: Three times a day (TID) | INTRAMUSCULAR | Status: DC
  Administered 2017-02-01: 5000 [IU] via SUBCUTANEOUS
  Filled 2017-02-01: qty 1

## 2017-02-01 MED ORDER — EPOETIN ALFA 4000 UNIT/ML IJ SOLN: 8000.0000 [IU] | INTRAMUSCULAR | Status: DC

## 2017-02-01 NOTE — Progress Notes (Signed)
After reviewing her chart in detail I do not see a strong indication to continue anticoagulation for this patient. Her VQ was negative, venous US without evidence of DVT. I would not routinely recommend anticoagulation prophylactically, though her obesity and sedentary lifestyle can increase her risk of clot. Her pulmonary HTN would not indicate anticoagulation on its own in the absence of confirmed CTEPH. I will d/c her coumadin, start [place] heparin for DVT prophylaxis.   Carlyle Dolly MD

## 2017-02-01 NOTE — Clinical Social Work Placement (Signed)
   CLINICAL SOCIAL WORK PLACEMENT  NOTE  Date:  02/01/2017  Patient Details  Name: [name] MRN: [id number] Date of Birth: [birth year]  Clinical Social Work is seeking post-discharge placement for this patient at the [place] level of care (*CSW will initial, date and re-position this form in  chart as items are completed):  Yes   Patient/family provided with [facility] Work Department's list of facilities offering this level of care within the geographic area requested by the patient (or if unable, by the patient's family).  Yes   Patient/family informed of their freedom to choose among providers that offer the needed level of care, that participate in Medicare, Medicaid or managed care program needed by the patient, have an available bed and are willing to accept the patient.  Yes   Patient/family informed of [facility]'s ownership interest in [facility] and [facility], as well as of the fact that they are under no obligation to receive care at these facilities.  PASRR submitted to EDS on 01/27/17     PASRR number received on 01/27/17     Existing PASRR number confirmed on       FL2 transmitted to all facilities in geographic area requested by pt/family on       FL2 transmitted to all facilities within larger geographic area on       Patient informed that his/her managed care company has contracts with or will negotiate with certain facilities, including the following:        Yes   Patient/family informed of bed offers received.  Patient chooses bed at [facility]     Physician recommends and patient chooses bed at      Patient to be transferred to [facility] on 02/01/17.  Patient to be transferred to facility by [facility] hospital staff     Patient family notified on 02/01/17 of transfer.  Name of family member notified:  Patient advised that she would contact her family independently.      PHYSICIAN        Additional Comment:    _______________________________________________ Ihor Gully, LCSW 02/01/2017, 10:19 AM

## 2017-02-01 NOTE — Progress Notes (Signed)
Subjective: Interval History: Patient is feeling much better. There is no episode of nausea or vomiting since yesterday. Presently she is able to eat. Patient also denies any difficulty in breathing.  Objective: Vital signs in last 24 hours: Temp:  [97.5 F (36.4 C)-98.7 F (37.1 C)] 98.3 F (36.8 C) (02/13 0729) Pulse Rate:  [77-107] 87 (02/12 1800) Resp:  [15-22] 15 (02/12 1800) BP: (124-149)/(64-111) 143/70 (02/12 1800) SpO2:  [94 %-100 %] 98 % (02/12 1800) Weight:  [148.5 kg (327 lb 6.1 oz)-149.5 kg (329 lb 9.4 oz)] 148.5 kg (327 lb 6.1 oz) (02/13 0500) Weight change: 0 kg (0 lb)  Intake/Output from previous day: 02/12 0701 - 02/13 0700 In: 228 [P.O.:120; IV Piggyback:108] Out: 3350 [Urine:350] Intake/Output this shift: No intake/output data recorded.  General appearance: alert, cooperative and no distress Resp: diminished breath sounds anterior - bilateral and wheezes bilaterally Cardio: regular rate and rhythm Extremities: edema Patient with venous stasis and chronic leg edema  Lab Results:  Recent Labs  01/31/17 0445 02/01/17 0412  WBC 12.3* 8.8  HGB 9.1* 9.1*  HCT 27.0* 27.0*  PLT 202 202   BMET:   Recent Labs  01/31/17 0445  NA 132*  K 3.4*  CL 94*  CO2 26  GLUCOSE 173*  BUN 48*  CREATININE 5.55*  CALCIUM 8.0*   No results for input(s): PTH in the last 72 hours. Iron Studies: No results for input(s): IRON, TIBC, TRANSFERRIN, FERRITIN in the last 72 hours.  Studies/Results: No results found.  I have reviewed the patient's current medications.  Assessment/Plan: Problem #1 sepsis: Patient continued to be on antibiotics . Patient is a febrile and her white blood cell count is normal. At this moment her sepsis seems to be resolved. Her blood pressure is low normal. Problem #2 end-stage renal disease:She is status post hemodialysis yesterday. Patient presently is asymptomatic. Problem #3 anemia: Her hemoglobin slightly low but stable. Patient is on  Epogen with dialysis. Problem #4 fluid management: Patient denies any difficulty breathing. We're able to ultrafiltrate 3 L during dialysis yesterday. Problem #5 metabolic bone disease: Calcium and phosphorus is range.. Problem # 6 sleep apnea: Not on CPAP Problem #7 morbid obesity Problem #8 diabetes: Her blood sugar is better controlled today. Problem #9 elevated LFTs: Possibly ischemic. Continuously improving. Problem #10 nausea and vomiting: Has improved. Plan: 1] patient doesn't require dialysis today.           2] her next dialysis will be tomorrow and we'll make arrangement for patient is still in the hospital. Otherwise she'll get as an outpatient.                                                            LOS: 14 days   [place] Hijazi S 02/01/2017,8:04 AM

## 2017-02-01 NOTE — Care Management Important Message (Signed)
Important Message  Patient Details  Name: [name] MRN: [id number] Date of Birth: [birth year]   Medicare Important Message Given:  Yes    Farhaan Mabee, Chauncey [place], RN 02/01/2017, 9:23 AM

## 2017-02-01 NOTE — Progress Notes (Signed)
Subjective: She says she feels much better. Her nausea has improved. She's been up in a chair some. She's off antibiotics and her white blood count has gone down now. She had dialysis yesterday.  Objective: Vital signs in last 24 hours: Temp:  [97.5 F (36.4 C)-98.7 F (37.1 C)] 98.3 F (36.8 C) (02/13 0729) Pulse Rate:  [77-88] 87 (02/12 1800) Resp:  [15-20] 15 (02/12 1800) BP: (124-149)/(64-111) 143/70 (02/12 1800) SpO2:  [94 %-100 %] 98 % (02/12 1800) Weight:  [148.5 kg (327 lb 6.1 oz)-149.5 kg (329 lb 9.4 oz)] 148.5 kg (327 lb 6.1 oz) (02/13 0500) Weight change: 0 kg (0 lb) Last BM Date: 01/31/17  Intake/Output from previous day: 02/12 0701 - 02/13 0700 In: 228 [P.O.:120; IV Piggyback:108] Out: 3350 [Urine:350]  PHYSICAL EXAM General appearance: alert, cooperative, no distress and morbidly obese Resp: clear to auscultation bilaterally Cardio: regular rate and rhythm, S1, S2 normal, no murmur, click, rub or gallop GI: soft, non-tender; bowel sounds normal; no masses,  no organomegaly Extremities: She has bilateral chronic lymphedema. The cellulitic changes are gone She is no longer confused. Mucous membranes are moist. Skin warm and dry  Lab Results:  Results for orders placed or performed during the hospital encounter of 01/18/17 (from the past 48 hour(s))  Glucose, capillary     Status: Abnormal   Collection Time: 01/30/17 11:35 AM  Result Value Ref Range   Glucose-Capillary 269 (H) 65 - 99 mg/dL  Glucose, capillary     Status: Abnormal   Collection Time: 01/30/17  5:15 PM  Result Value Ref Range   Glucose-Capillary 188 (H) 65 - 99 mg/dL  Glucose, capillary     Status: Abnormal   Collection Time: 01/30/17  9:54 PM  Result Value Ref Range   Glucose-Capillary 160 (H) 65 - 99 mg/dL  CBC     Status: Abnormal   Collection Time: 01/31/17  4:45 AM  Result Value Ref Range   WBC 12.3 (H) 4.0 - 10.5 K/uL   RBC 2.84 (L) 3.87 - 5.11 MIL/uL   Hemoglobin 9.1 (L) 12.0 - 15.0  g/dL   HCT 27.0 (L) 36.0 - 46.0 %   MCV 95.1 78.0 - 100.0 fL   MCH 32.0 26.0 - 34.0 pg   MCHC 33.7 30.0 - 36.0 g/dL   RDW 15.8 (H) 11.5 - 15.5 %   Platelets 202 150 - 400 K/uL  Protime-INR     Status: Abnormal   Collection Time: 01/31/17  4:45 AM  Result Value Ref Range   Prothrombin Time 22.8 (H) 11.4 - 15.2 seconds   INR 1.98   Renal function panel     Status: Abnormal   Collection Time: 01/31/17  4:45 AM  Result Value Ref Range   Sodium 132 (L) 135 - 145 mmol/L   Potassium 3.4 (L) 3.5 - 5.1 mmol/L   Chloride 94 (L) 101 - 111 mmol/L   CO2 26 22 - 32 mmol/L   Glucose, Bld 173 (H) 65 - 99 mg/dL   BUN 48 (H) 6 - 20 mg/dL   Creatinine, Ser 5.55 (H) 0.44 - 1.00 mg/dL   Calcium 8.0 (L) 8.9 - 10.3 mg/dL   Phosphorus 4.7 (H) 2.5 - 4.6 mg/dL   Albumin 2.2 (L) 3.5 - 5.0 g/dL   GFR calc non Af Amer 8 (L) >60 mL/min   GFR calc Af Amer 9 (L) >60 mL/min    Comment: (NOTE) The eGFR has been calculated using the CKD EPI equation. This calculation has  not been validated in all clinical situations. eGFR's persistently <60 mL/min signify possible Chronic Kidney Disease.    Anion gap 12 5 - 15  Glucose, capillary     Status: Abnormal   Collection Time: 01/31/17  8:13 AM  Result Value Ref Range   Glucose-Capillary 162 (H) 65 - 99 mg/dL  Glucose, capillary     Status: Abnormal   Collection Time: 01/31/17 12:00 PM  Result Value Ref Range   Glucose-Capillary 147 (H) 65 - 99 mg/dL  Glucose, capillary     Status: Abnormal   Collection Time: 01/31/17  4:11 PM  Result Value Ref Range   Glucose-Capillary 174 (H) 65 - 99 mg/dL  Glucose, capillary     Status: Abnormal   Collection Time: 01/31/17  9:19 PM  Result Value Ref Range   Glucose-Capillary 193 (H) 65 - 99 mg/dL   Comment 1 Notify RN   CBC     Status: Abnormal   Collection Time: 02/01/17  4:12 AM  Result Value Ref Range   WBC 8.8 4.0 - 10.5 K/uL   RBC 2.82 (L) 3.87 - 5.11 MIL/uL   Hemoglobin 9.1 (L) 12.0 - 15.0 g/dL   HCT 27.0 (L)  36.0 - 46.0 %   MCV 95.7 78.0 - 100.0 fL   MCH 32.3 26.0 - 34.0 pg   MCHC 33.7 30.0 - 36.0 g/dL   RDW 15.5 11.5 - 15.5 %   Platelets 202 150 - 400 K/uL  Protime-INR     Status: Abnormal   Collection Time: 02/01/17  4:12 AM  Result Value Ref Range   Prothrombin Time 20.0 (H) 11.4 - 15.2 seconds   INR 1.68   Glucose, capillary     Status: Abnormal   Collection Time: 02/01/17  7:28 AM  Result Value Ref Range   Glucose-Capillary 195 (H) 65 - 99 mg/dL    ABGS No results for input(s): PHART, PO2ART, TCO2, HCO3 in the last 72 hours.  Invalid input(s): PCO2 CULTURES Recent Results (from the past 240 hour(s))  Culture, blood (Routine X 2) w Reflex to ID Panel     Status: None   Collection Time: 01/27/17  7:41 AM  Result Value Ref Range Status   Specimen Description BLOOD RIGHT ARM  Final   Special Requests   Final    BOTTLES DRAWN AEROBIC AND ANAEROBIC  8 CC EACH BOTTLE   Culture NO GROWTH 5 DAYS  Final   Report Status 02/01/2017 FINAL  Final  Culture, blood (Routine X 2) w Reflex to ID Panel     Status: None   Collection Time: 01/27/17  7:50 AM  Result Value Ref Range Status   Specimen Description BLOOD CENTRAL LINE  Final   Special Requests BOTTLES DRAWN AEROBIC ONLY 8 CC  Final   Culture NO GROWTH 5 DAYS  Final   Report Status 02/01/2017 FINAL  Final   Studies/Results: No results found.  Medications:  Prior to Admission:  Prescriptions Prior to Admission  Medication Sig Dispense Refill Last Dose  . acetaminophen (TYLENOL) 325 MG tablet Take 650 mg by mouth every 6 (six) hours as needed for moderate pain or fever.    01/17/2017 at Unknown time  . albuterol (PROVENTIL) (2.5 MG/3ML) 0.083% nebulizer solution Take 2.5 mg by nebulization every 6 (six) hours as needed for wheezing or shortness of breath.   Past Month at Unknown time  . ALPRAZolam (XANAX) 0.5 MG tablet Take 0.5 mg by mouth 3 (three) times daily as needed for  anxiety or sleep.    01/17/2017 at Unknown time  .  amLODipine (NORVASC) 10 MG tablet Take 0.5 tablets (5 mg total) by mouth daily. 15 tablet 12 01/17/2017 at Unknown time  . cinacalcet (SENSIPAR) 30 MG tablet Take 30 mg by mouth daily.   01/17/2017 at Unknown time  . diphenhydrAMINE (BENADRYL) 25 mg capsule Take 25 mg by mouth every 6 (six) hours as needed for allergies.   unknown  . Insulin Glargine (LANTUS SOLOSTAR) 100 UNIT/ML Solostar Pen Inject 10 Units into the skin daily at 10 pm. 15 mL 11 01/17/2017 at Unknown time  . metoprolol (LOPRESSOR) 50 MG tablet Take 0.5 tablets (25 mg total) by mouth 2 (two) times daily. 30 tablet 12 01/17/2017 at Unknown time  . multivitamin (RENA-VIT) TABS tablet Take 1 tablet by mouth daily.   01/17/2017 at Unknown time  . Oxycodone HCl 10 MG TABS Take 1 tablet (10 mg total) by mouth 3 (three) times daily as needed (pain). (Patient taking differently: Take 10 mg by mouth 4 (four) times daily as needed (pain). ) 15 tablet 0 unknown  . sevelamer carbonate (RENVELA) 800 MG tablet Take 1,600-3,200 mg by mouth See admin instructions. Take 3200 mg by mouth 3 times daily with meals and take 1600 mg by mouth with snacks.   01/17/2017 at Unknown time  . doxycycline (VIBRAMYCIN) 50 MG capsule Take 2 capsules (100 mg total) by mouth 2 (two) times daily. (Patient not taking: Reported on 01/18/2017) 20 capsule 0 Not Taking at Unknown time  . predniSONE (STERAPRED UNI-PAK 21 TAB) 10 MG (21) TBPK tablet Take by package instructions (Patient not taking: Reported on 01/18/2017) 21 tablet 0 Not Taking at Unknown time   Scheduled: . Chlorhexidine Gluconate Cloth  6 each Topical Daily  . epoetin (EPOGEN/PROCRIT) injection  8,000 Units Intravenous Q M,W,F-HD  . insulin aspart  0-9 Units Subcutaneous TID WC  . mouth rinse  15 mL Mouth Rinse BID  . metoCLOPramide  5 mg Oral TID AC & HS  . ondansetron (ZOFRAN) IV  8 mg Intravenous Q6H  . sodium chloride flush  10-40 mL Intracatheter Q12H  . Warfarin - Pharmacist Dosing Inpatient   Does not  apply q1800   Continuous: . phenylephrine ([name]-SYNEPHRINE) Adult infusion Stopped (01/26/17 1300)   WCB:JSEGBT chloride, sodium chloride, sodium chloride, sodium chloride, acetaminophen, albuterol, ALPRAZolam, alteplase, guaiFENesin-dextromethorphan, heparin, levalbuterol, lidocaine (PF), lidocaine-prilocaine, ondansetron **OR** ondansetron (ZOFRAN) IV, oxyCODONE, pentafluoroprop-tetrafluoroeth, sodium chloride flush, technetium tetrofosmin, technetium tetrofosmin  Assesment: She was admitted with cellulitis of her leg and septic shock from that. She has had shock liver which has improved. She has slowly responded. She is exceptionally weak and it has been recommended that she go to skilled care facility for rehabilitation. Her cellulitis is gone.  She had metabolic encephalopathy related to her cellulitis and sepsis and that has cleared.  She has obesity hypoventilation syndrome.  She has chronic diastolic heart failure with a component of cor pulmonale and she has been placed on anticoagulation because of that.  She has chronic hypoxic respiratory failure which is better  She has morbid obesity unchanged  She has end-stage renal failure on dialysis and dialyzed yesterday Principal Problem:   Fever Active Problems:   Diabetes mellitus ([place])   Morbid obesity ([place])   Obesity hypoventilation syndrome ([facility])   Recurrent cellulitis of lower leg   Chronic diastolic CHF (congestive heart failure) ([facility])   Chronic respiratory failure with hypoxia ([facility])   ESRD on dialysis ([place])  Flu-like symptoms   Elevated troponin   Septic shock ([facility])   Shock liver   Cor pulmonale, chronic (H[facility])    Plan: Continue treatments. She should be okay to transfer to skilled care facility soon    LOS: 14 days   Keshonda Monsour L 02/01/2017, 8:17 AM

## 2017-02-01 NOTE — Discharge Summary (Signed)
Physician Discharge Summary  Patient ID: [name] MRN: [id number] DOB/AGE: Apr 12, 1966 51 y.o. Primary Care Physician:Genifer Lazenby L, MD Admit date: 01/18/2017 Discharge date: 02/01/2017    Discharge Diagnoses:   Principal Problem:   Fever Active Problems:   Diabetes mellitus ([place])   Morbid obesity ([place])   Obesity hypoventilation syndrome (HCC)   Recurrent cellulitis of lower leg   Chronic diastolic CHF (congestive heart failure) (HCC)   Chronic respiratory failure with hypoxia (HCC)   ESRD on dialysis (HCC)   Flu-like symptoms   Elevated troponin   Septic shock (HCC)   Shock liver   Cor pulmonale, chronic (HCC)   Allergies as of 02/01/2017      Reactions   Contrast Media [iodinated Diagnostic Agents] Anaphylaxis, Hives, Swelling, Other (See Comments)   Dye for cardiac cath. Tongue swells   Pneumococcal Vaccines Swelling   Turns skin black, and bodily swelling   Shellfish Allergy Itching, Swelling, Other (See Comments)   Facial swelling - Pt able to eat now 11/25/16   Vancomycin Nausea And Vomiting, Other (See Comments)   Infusion "made me feel like I was dying" had to be readmitted to hospital      Medication List    STOP taking these medications   doxycycline 50 MG capsule Commonly known as:  VIBRAMYCIN   predniSONE 10 MG (21) Tbpk tablet Commonly known as:  STERAPRED UNI-PAK 21 TAB     TAKE these medications   acetaminophen 325 MG tablet Commonly known as:  TYLENOL Take 650 mg by mouth every 6 (six) hours as needed for moderate pain or fever.   albuterol (2.5 MG/3ML) 0.083% nebulizer solution Commonly known as:  PROVENTIL Take 2.5 mg by nebulization every 6 (six) hours as needed for wheezing or shortness of breath.   ALPRAZolam 0.5 MG tablet Commonly known as:  XANAX Take 0.5 mg by mouth 3 (three) times daily as needed for anxiety or sleep.   amLODipine 10 MG tablet Commonly known as:  NORVASC Take 0.5 tablets (5 mg total) by mouth daily.    cinacalcet 30 MG tablet Commonly known as:  SENSIPAR Take 30 mg by mouth daily.   diphenhydrAMINE 25 mg capsule Commonly known as:  BENADRYL Take 25 mg by mouth every 6 (six) hours as needed for allergies.   epoetin alfa 4000 UNIT/ML injection Commonly known as:  EPOGEN,PROCRIT Inject 2 mLs (8,000 Units total) into the vein every Monday, Wednesday, and Friday with hemodialysis. Start taking on:  02/02/2017   insulin aspart 100 UNIT/ML injection Commonly known as:  novoLOG Inject 0-9 Units into the skin 3 (three) times daily with meals.   Insulin Glargine 100 UNIT/ML Solostar Pen Commonly known as:  LANTUS SOLOSTAR Inject 10 Units into the skin daily at 10 pm.   lidocaine (PF) 1 % Soln injection Commonly known as:  XYLOCAINE Inject 5 mLs into the skin as needed (topical anesthesia for hemodialysis ifGEBAUERS is ineffective.).   lidocaine-prilocaine cream Commonly known as:  EMLA Apply 1 application topically as needed (topical anesthesia for hemodialysis if Gebauers and Lidocaine injection are ineffective.).   metoCLOPramide 5 MG tablet Commonly known as:  REGLAN Take 1 tablet (5 mg total) by mouth 4 (four) times daily -  before meals and at bedtime.   metoprolol 50 MG tablet Commonly known as:  LOPRESSOR Take 0.5 tablets (25 mg total) by mouth 2 (two) times daily.   multivitamin Tabs tablet Take 1 tablet by mouth daily.   ondansetron 4 MG tablet Commonly known  as:  ZOFRAN Take 1 tablet (4 mg total) by mouth every 6 (six) hours as needed for nausea.   Oxycodone HCl 10 MG Tabs Take 1 tablet (10 mg total) by mouth 4 (four) times daily as needed (pain).   pentafluoroprop-tetrafluoroeth Aero Commonly known as:  GEBAUERS Apply 1 application topically as needed (topical anesthesia for hemodialysis).   sevelamer carbonate 800 MG tablet Commonly known as:  RENVELA Take 1,600-3,200 mg by mouth See admin instructions. Take 3200 mg by mouth 3 times daily with meals and take  1600 mg by mouth with snacks.       Discharged Condition:Improved    Consults: Nephrology/cardiology  Significant Diagnostic Studies: Dg Chest 1 View  Result Date: 01/25/2017 CLINICAL DATA:  For comparison with VQ scan EXAM: CHEST 1 VIEW COMPARISON:  01/20/2017 FINDINGS: Cardiomegaly with vascular congestion. Mild interstitial prominence again noted could reflect interstitial edema. No confluent opacities or effusions. No acute bony abnormality. Right internal jugular PICC line remains in place, unchanged. IMPRESSION: Cardiomegaly with vascular congestion and interstitial prominence, likely mild interstitial edema. Electronically Signed   By: Rolm Baptise M.D.   On: 01/25/2017 12:52   US Abdomen Complete  Result Date: 01/27/2017 CLINICAL DATA:  Nausea and vomiting with increasing severity over the past 2 days. History of morbid obesity, end-stage renal disease, diabetes, cholecystitis. EXAM: ABDOMEN ULTRASOUND COMPLETE COMPARISON:  Abdominopelvic CT scan of August 12, 2016. FINDINGS: Gallbladder: The gallbladder is contracted. There is gallbladder wall thickening accentuated by the contracted state. No stones are evident. There is no positive sonographic Murphy's sign. Common bile duct: Diameter: 3.9 mm. Liver: The hepatic echotexture is slightly heterogeneous. There is no discrete mass or ductal dilation. IVC: No abnormality visualized. Pancreas: Visualized portion unremarkable. Spleen: Normal where visualized. Right Kidney: Length: 12.4 cm. Echogenicity within normal limits. No mass or hydronephrosis visualized. Left Kidney: Length: 11.7 cm. Visualization of the renal parenchyma was somewhat limited due to bowel gas and the patient's body habitus. There is no hydronephrosis. Abdominal aorta: No aneurysm visualized. Other findings: No ascites is observed. IMPRESSION: The study is limited due to bowel gas and the patient's body habitus. The gallbladder is contracted. No definite stones are observed.  No positive sonographic Murphy's sign. If chronic cholecystitis is suspected clinically, a nuclear medicine hepatobiliary scan with gallbladder ejection fraction determination may be useful. Probable mild fatty infiltrative change of the liver. No acute intra-abdominal abnormality is observed otherwise. Electronically Signed   By: David  Martinique M.D.   On: 01/27/2017 13:25   Nm Pulmonary Perf And Vent  Result Date: 01/25/2017 CLINICAL DATA:  Cor pulmonale EXAM: NUCLEAR MEDICINE VENTILATION - PERFUSION LUNG SCAN TECHNIQUE: Ventilation images were obtained in multiple projections using inhaled aerosol Tc-27mDTPA. Perfusion images were obtained in multiple projections after intravenous injection of Tc-943mAA. Patient unable to remove arms from imaging field in order to perform lateral views. RADIOPHARMACEUTICALS:  3.85 mCi Technetium-9975mPA aerosol inhalation and 30 mCi Technetium-69m56m IV COMPARISON:  None Correlation: Chest radiograph 01/25/2017 FINDINGS: Ventilation: Central airway deposition of tracer. Mild diminished ventilation in LEFT lower lobe. Enlargement of cardiac silhouette. No other segmental or subsegmental ventilatory defects. Perfusion: Matching diminished perfusion in LEFT lower lobe. Enlargement of cardiac silhouette. No additional segmental or subsegmental perfusion defects. Chest radiograph: Enlargement of cardiac silhouette with pulmonary vascular congestion and probable interstitial edema IMPRESSION: Matching diminished ventilation and perfusion in the LEFT lower lobe. Enlargement of cardiac silhouette. Very low probability for pulmonary embolism. Electronically Signed   By: MarkElta [place]  Thornton Papas M.D.   On: 01/25/2017 13:04   US Venous Img Lower Bilateral  Result Date: 01/25/2017 CLINICAL DATA:  Bilateral lower extremity pain and edema. History of chronic lymphedema and varicose veins. Evaluate for DVT. EXAM: BILATERAL LOWER EXTREMITY VENOUS DOPPLER ULTRASOUND TECHNIQUE: Gray-scale sonography  with graded compression, as well as color Doppler and duplex ultrasound were performed to evaluate the lower extremity deep venous systems from the level of the common femoral vein and including the common femoral, femoral, profunda femoral, popliteal and calf veins including the posterior tibial, peroneal and gastrocnemius veins when visible. The superficial great saphenous vein was also interrogated. Spectral Doppler was utilized to evaluate flow at rest and with distal augmentation maneuvers in the common femoral, femoral and popliteal veins. COMPARISON:  None. FINDINGS: Examination is degraded due to patient body habitus and poor sonographic window. RIGHT LOWER EXTREMITY Common Femoral Vein: No evidence of thrombus. Normal compressibility, respiratory phasicity and response to augmentation. Saphenofemoral Junction: No evidence of thrombus. Normal compressibility and flow on color Doppler imaging. Profunda Femoral Vein: No evidence of thrombus. Normal compressibility and flow on color Doppler imaging. Femoral Vein: No evidence of thrombus. Normal compressibility, respiratory phasicity and response to augmentation. Popliteal Vein: No evidence of thrombus. Normal compressibility, respiratory phasicity and response to augmentation. Calf Veins: No evidence of thrombus. Normal compressibility and flow on color Doppler imaging. Superficial Great Saphenous Vein: No evidence of thrombus. Normal compressibility and flow on color Doppler imaging. Venous Reflux:  None. Other Findings:  None. LEFT LOWER EXTREMITY Common Femoral Vein: No evidence of thrombus. Normal compressibility, respiratory phasicity and response to augmentation. Saphenofemoral Junction: No evidence of thrombus. Normal compressibility and flow on color Doppler imaging. Profunda Femoral Vein: No evidence of thrombus. Normal compressibility and flow on color Doppler imaging. Femoral Vein: No evidence of thrombus. Normal compressibility, respiratory  phasicity and response to augmentation. Popliteal Vein: No evidence of thrombus. Normal compressibility, respiratory phasicity and response to augmentation. Calf Veins: No evidence of thrombus. Normal compressibility and flow on color Doppler imaging. Superficial Great Saphenous Vein: No evidence of thrombus. Normal compressibility and flow on color Doppler imaging. Venous Reflux:  None. Other Findings:  None. IMPRESSION: Body habitus degraded examination without definitive evidence of DVT within either lower extremity. Electronically Signed   By: Sandi Mariscal M.D.   On: 01/25/2017 16:38   US Venous Img Upper Bilat  Result Date: 01/25/2017 CLINICAL DATA:  Cor pulmonale. History of CHF and morbid obesity. History of right jugular vein approach PICC line and dialysis access within the left forearm. Evaluate for DVT. EXAM: BILATERAL UPPER EXTREMITY VENOUS DOPPLER ULTRASOUND TECHNIQUE: Gray-scale sonography with graded compression, as well as color Doppler and duplex ultrasound were performed to evaluate the bilateral upper extremity deep venous systems from the level of the subclavian vein and including the jugular, axillary, basilic, radial, ulnar and upper cephalic vein. Spectral Doppler was utilized to evaluate flow at rest and with distal augmentation maneuvers. COMPARISON:  None. FINDINGS: RIGHT UPPER EXTREMITY Internal Jugular Vein: No evidence of thrombus. Normal compressibility, respiratory phasicity and response to augmentation. Subclavian Vein: No evidence of thrombus. Normal compressibility, respiratory phasicity and response to augmentation. Axillary Vein: No evidence of thrombus. Normal compressibility, respiratory phasicity and response to augmentation. Cephalic Vein: No evidence of thrombus. Normal compressibility, respiratory phasicity and response to augmentation. Basilic Vein: No evidence of thrombus. Normal compressibility, respiratory phasicity and response to augmentation. Brachial Veins: No  evidence of thrombus. Normal compressibility, respiratory phasicity and response to augmentation. Radial Veins:  No evidence of thrombus. Normal compressibility, respiratory phasicity and response to augmentation. Ulnar Veins: No evidence of thrombus. Normal compressibility, respiratory phasicity and response to augmentation. Venous Reflux:  None. Other Findings:  None. LEFT UPPER EXTREMITY Internal Jugular Vein: No evidence of thrombus. Normal compressibility, respiratory phasicity and response to augmentation. Subclavian Vein: No evidence of thrombus. Normal compressibility, respiratory phasicity and response to augmentation. Axillary Vein: No evidence of thrombus. Normal compressibility, respiratory phasicity and response to augmentation. Cephalic Vein: No evidence of thrombus. Normal compressibility, respiratory phasicity and response to augmentation. Basilic Vein: No evidence of thrombus. Normal compressibility, respiratory phasicity and response to augmentation. Brachial Veins: No evidence of thrombus. Normal compressibility, respiratory phasicity and response to augmentation. Radial Veins: No evidence of thrombus. Normal compressibility, respiratory phasicity and response to augmentation. Ulnar Veins: No evidence of thrombus. Normal compressibility, respiratory phasicity and response to augmentation. Venous Reflux:  None. Other Findings: The dialysis graft/fistula within the left forearm appears patent where imaged. IMPRESSION: 1. No evidence of DVT within either upper extremity. 2. The dialysis graft/fistula within left forearm appears patent where imaged. Electronically Signed   By: Sandi Mariscal M.D.   On: 01/25/2017 17:02   Dg Chest Port 1 View  Result Date: 01/20/2017 CLINICAL DATA:  Chest pain. EXAM: PORTABLE CHEST 1 VIEW COMPARISON:  01/19/2017. FINDINGS: Right IJ line noted in stable position. Cardiomegaly with bilateral pulmonary interstitial prominence. Findings consistent with congestive heart  failure. Basilar atelectasis. Small left pleural effusion cannot be excluded. No pneumothorax. IMPRESSION: 1. Right IJ line stable position. 2. Cardiomegaly with mild bilateral from interstitial prominence consistent congestive heart failure. Small left pleural effusion cannot be excluded. 2. Low lung volumes with basilar atelectasis Electronically Signed   By: Marcello Moores  Register   On: 01/20/2017 09:07   Dg Chest Port 1 View  Result Date: 01/19/2017 CLINICAL DATA:  Status post central line placement EXAM: PORTABLE CHEST 1 VIEW COMPARISON:  01/18/2017 FINDINGS: Right jugular line is noted with the catheter tip in the mid to distal superior vena cava. No pneumothorax is noted. The lungs are clear. The cardiac shadow is stable. IMPRESSION: No pneumothorax following right jugular central line placement. Electronically Signed   By: Inez Catalina M.D.   On: 01/19/2017 12:11   Dg Chest Port 1 View  Result Date: 01/18/2017 CLINICAL DATA:  Short of breath, cough, fever and chills today. Pneumonia 2 weeks ago. EXAM: PORTABLE CHEST 1 VIEW COMPARISON:  12/31/2016. FINDINGS: Mild enlargement of the cardiac silhouette, stable. No mediastinal or hilar masses. No convincing adenopathy. Mild vascular congestion. No convincing pulmonary edema. No evidence of pneumonia. No convincing pleural effusion.  No pneumothorax. Skeletal structures are intact. IMPRESSION: No convincing acute cardiopulmonary disease. No definitive pneumonia or pulmonary edema. Electronically Signed   By: Lajean Manes M.D.   On: 01/18/2017 16:02    Lab Results: Basic Metabolic Panel:  Recent Labs  01/30/17 0400 01/31/17 0445  NA  --  132*  K  --  3.4*  CL  --  94*  CO2  --  26  GLUCOSE  --  173*  BUN  --  48*  CREATININE  --  5.55*  CALCIUM  --  8.0*  PHOS 5.2* 4.7*   Liver Function Tests:  Recent Labs  01/31/17 0445  ALBUMIN 2.2*     CBC:  Recent Labs  01/31/17 0445 02/01/17 0412  WBC 12.3* 8.8  HGB 9.1* 9.1*  HCT 27.0*  27.0*  MCV 95.1 95.7  PLT 202 202  Recent Results (from the past 240 hour(s))  Culture, blood (Routine X 2) w Reflex to ID Panel     Status: None   Collection Time: 01/27/17  7:41 AM  Result Value Ref Range Status   Specimen Description BLOOD RIGHT ARM  Final   Special Requests   Final    BOTTLES DRAWN AEROBIC AND ANAEROBIC  8 CC EACH BOTTLE   Culture NO GROWTH 5 DAYS  Final   Report Status 02/01/2017 FINAL  Final  Culture, blood (Routine X 2) w Reflex to ID Panel     Status: None   Collection Time: 01/27/17  7:50 AM  Result Value Ref Range Status   Specimen Description BLOOD CENTRAL LINE  Final   Special Requests BOTTLES DRAWN AEROBIC ONLY 8 CC  Final   Culture NO GROWTH 5 DAYS  Final   Report Status 02/01/2017 FINAL  Final     Hospital Course: This is a 51 year old who came to the emergency department with complaints of pain in her left leg. She was found to have cellulitis of the left leg which is a recurrent problem. She has chronic lymphedema at baseline. She was started on intravenous antibiotics but was septic. She was admitted to stepdown later transferred to intensive care unit. She had septic shock. She was resuscitated with fluid and with pressors. She had shock liver which improved. At baseline she has end-stage renal disease on dialysis and she had dialysis while she was in the hospital. She was found to have right heart failure. She initially was anticoagulated because of concerns that she had blood clots but after reviewing was determined that she did not have blood clots and that anticoagulation was more risk than benefit. She continued to have trouble with metabolic encephalopathy but that slowly improved. She was able to wean off pressors. Her nausea was better by the time of discharge. It was recommended that she go to a skilled care facility at the time of discharge and that is the plan at this time.  Discharge Exam: Blood pressure 132/70, pulse 91, temperature 98.3 F  (36.8 C), temperature source Oral, resp. rate 20, height _0  (1.651 m), weight (!) 148.5 kg (327 lb 6.1 oz), SpO2 92 %. She is awake and alert. No acute distress. She is morbidly obese. Chronic lymphedema of her legs but her leg is back to baseline  Disposition: To skilled care facility for rehabilitation. She will have PT OT and speech as needed. She will be on Accu-Cheks before meals and at bedtime. She will have facility sliding scale insulin sensitive.  Discharge Instructions    Diet - low sodium heart healthy    Complete by:  As directed    Increase activity slowly    Complete by:  As directed       Contact information for after-discharge care    [facility] .   Specialty:  Skilled Nursing Facility Contact information: [address] [place] [phone]              Signed: Alonza Bogus   02/01/2017, 9:12 AM

## 2017-02-01 NOTE — Progress Notes (Signed)
Casa with Suanne Marker, LPN at [facility] who will be receiving patient today at [facility]. Report on patient given to [facility], LPN and plan to move patient after lunch today. Patient aware.

## 2017-02-01 NOTE — Clinical Social Work Note (Signed)
LCSW spoke with patient about her discharge. Patient advised that she would call her family and notify them of her discharge. LCSW notified Marianna Fuss at [facility] that patient was discharging today. Patient will go to room 131 at [facility].   LCSW signing off.        Renella Steig, Clydene Pugh, LCSW

## 2017-02-02 DIAGNOSIS — N2581 Secondary hyperparathyroidism of renal origin: Secondary | ICD-10-CM | POA: Diagnosis not present

## 2017-02-02 DIAGNOSIS — N186 End stage renal disease: Secondary | ICD-10-CM | POA: Diagnosis not present

## 2017-02-02 DIAGNOSIS — D631 Anemia in chronic kidney disease: Secondary | ICD-10-CM | POA: Diagnosis not present

## 2017-02-02 DIAGNOSIS — D509 Iron deficiency anemia, unspecified: Secondary | ICD-10-CM | POA: Diagnosis not present

## 2017-02-02 DIAGNOSIS — Z992 Dependence on renal dialysis: Secondary | ICD-10-CM | POA: Diagnosis not present

## 2017-02-02 DIAGNOSIS — R110 Nausea: Secondary | ICD-10-CM | POA: Diagnosis not present

## 2017-02-04 DIAGNOSIS — D509 Iron deficiency anemia, unspecified: Secondary | ICD-10-CM | POA: Diagnosis not present

## 2017-02-04 DIAGNOSIS — N2581 Secondary hyperparathyroidism of renal origin: Secondary | ICD-10-CM | POA: Diagnosis not present

## 2017-02-04 DIAGNOSIS — D631 Anemia in chronic kidney disease: Secondary | ICD-10-CM | POA: Diagnosis not present

## 2017-02-04 DIAGNOSIS — N186 End stage renal disease: Secondary | ICD-10-CM | POA: Diagnosis not present

## 2017-02-04 DIAGNOSIS — R110 Nausea: Secondary | ICD-10-CM | POA: Diagnosis not present

## 2017-02-04 DIAGNOSIS — Z992 Dependence on renal dialysis: Secondary | ICD-10-CM | POA: Diagnosis not present

## 2017-02-05 DIAGNOSIS — A419 Sepsis, unspecified organism: Secondary | ICD-10-CM | POA: Diagnosis not present

## 2017-02-05 DIAGNOSIS — L03116 Cellulitis of left lower limb: Secondary | ICD-10-CM | POA: Diagnosis not present

## 2017-02-06 NOTE — H&P (Signed)
[name] MRN: [id number] DOB/AGE: 03/19/1966 51 y.o. Primary Care Physician:Chani Ghanem L, MD Admit date: 02/01/2017 Chief Complaint: For rehabilitation HPI: This is documentation of my history and physical done at the skilled care facility on 02/05/2017. She had a prolonged hospitalization from severe sepsis with cellulitis of her leg. She was very weak and felt to need rehabilitation to be able to get back home. She says she feels much better. She is stronger. She's been able to walk some. At baseline she has morbid obesity, diabetes, chronic lymphedema of both legs, asthma, end-stage renal disease on dialysis. She has heart failure which is managed mostly through her dialysis. She had shock liver while she was in the hospital and that has improved. She had encephalopathy which has resolved. She has no complaints now. Her appetite is better no nausea or vomiting no chest pain no abdominal pain no leg pain  Past Medical History:  Diagnosis Date  . Anxiety   . Asthma    as a child  . Cancer ([place])    thyroid  . Cellulitis   . CHF (congestive heart failure) ([place])   . Chiari malformation    s/p surgery  . Chiari malformation   . Chronic pain   . Complication of anesthesia 11/28/15   Resp arrest after  conscious  sedation  . Constipation   . Coronary artery disease    40-50% mid LAD 04/29/09, Medical tx. (Dr. Gwenlyn Found)  . Diabetes mellitus    Type II  . Fibromyalgia   . History of blood transfusion    hemorrage duinrg pregancy  . Hx of echocardiogram 10/2011   EF 55-60%  . Hypercholesterolemia   . Hypertension   . Lymph edema   . Obesity hypoventilation syndrome ([place])   . On home O2   . Pneumonia    in past  . Renal disorder    Pt started dialysis in Dec.2016  . S/P colonoscopy 05/26/2007   Dr. Laural Golden sigmoid diverticulosis random biopsies benign  . S/P endoscopy 05/01/2009   Dr. Penelope Coop pill-induced esophageal ulcerations distal to midesophagus, 2 small ulcers in the  antrum of the stomach  . Shortness of breath dyspnea    with any exertion or if heart rate  is irregular while on dialysis  . Sleep apnea    Past Surgical History:  Procedure Laterality Date  . Marland KitchenHemodialysis catheter Right 11/28/2015  . ABDOMINAL HYSTERECTOMY    . ADENOIDECTOMY    . AV FISTULA PLACEMENT Left 03/15/2016   Procedure: CREATION OF LEFT ARM ARTERIOVENOUS (AV) FISTULA  ;  Surgeon: Rosetta Posner, MD;  Location: [facility];  Service: Vascular;  Laterality: Left;  . CESAREAN SECTION      x 2  . CRANIECTOMY SUBOCCIPITAL W/ CERVICAL LAMINECTOMY / CHIARI    . FISTULA SUPERFICIALIZATION Left 05/10/2016   Procedure: Left Arm FISTULA SUPERFICIALIZATION;  Surgeon: Rosetta Posner, MD;  Location: [facility] OR;  Service: Vascular;  Laterality: Left;  . IR GENERIC HISTORICAL  08/14/2016   IR REMOVAL TUN ACCESS W/ PORT W/O FL MOD SED 08/14/2016 Arne Cleveland, MD [facility]  . PERIPHERAL VASCULAR CATHETERIZATION Left 11/25/2016   Procedure: A/V Fistulagram;  Surgeon: Conrad New Pekin, MD;  Location: [facility];  Service: Cardiovascular;  Laterality: Left;  arm  . PORTACATH PLACEMENT  07/05/2012   Procedure: INSERTION PORT-A-CATH;  Surgeon: Donato Heinz, MD;  Location: [facility];  Service: General;  Laterality: Left;  subclavian  . THYROIDECTOMY, PARTIAL    .  TONSILLECTOMY          Family History  Problem Relation Age of Onset  . Colon cancer Mother 49  . Stroke Mother 80  . Coronary artery disease Mother     Social History:  reports that she has never smoked. She has never used smokeless tobacco. She reports that she does not drink alcohol or use drugs.   Allergies:  Allergies  Allergen Reactions  . Contrast Media [Iodinated Diagnostic Agents] Anaphylaxis, Hives, Swelling and Other (See Comments)    Dye for cardiac cath. Tongue swells  . Pneumococcal Vaccines Swelling    Turns skin black, and bodily swelling  . Shellfish Allergy Itching, Swelling and Other (See Comments)    Facial  swelling - Pt able to eat now 11/25/16  . Vancomycin Nausea And Vomiting and Other (See Comments)    Infusion "made me feel like I was dying" had to be readmitted to hospital    Medications Prior to Admission  Medication Sig Dispense Refill  . acetaminophen (TYLENOL) 325 MG tablet Take 650 mg by mouth every 6 (six) hours as needed for moderate pain or fever.     Marland Kitchen albuterol (PROVENTIL) (2.5 MG/3ML) 0.083% nebulizer solution Take 2.5 mg by nebulization every 6 (six) hours as needed for wheezing or shortness of breath.    . ALPRAZolam (XANAX) 0.5 MG tablet Take 0.5 mg by mouth 3 (three) times daily as needed for anxiety or sleep.     Marland Kitchen amLODipine (NORVASC) 10 MG tablet Take 0.5 tablets (5 mg total) by mouth daily. 15 tablet 12  . cinacalcet (SENSIPAR) 30 MG tablet Take 30 mg by mouth daily.    . diphenhydrAMINE (BENADRYL) 25 mg capsule Take 25 mg by mouth every 6 (six) hours as needed for allergies.    Marland Kitchen epoetin alfa (EPOGEN,PROCRIT) 4000 UNIT/ML injection Inject 2 mLs (8,000 Units total) into the vein every Monday, Wednesday, and Friday with hemodialysis. 1 mL   . insulin aspart (NOVOLOG) 100 UNIT/ML injection Inject 0-9 Units into the skin 3 (three) times daily with meals. 10 mL 11  . Insulin Glargine (LANTUS SOLOSTAR) 100 UNIT/ML Solostar Pen Inject 10 Units into the skin daily at 10 pm. 15 mL 11  . lidocaine, PF, (XYLOCAINE) 1 % SOLN injection Inject 5 mLs into the skin as needed (topical anesthesia for hemodialysis ifGEBAUERS is ineffective.).  0  . lidocaine-prilocaine (EMLA) cream Apply 1 application topically as needed (topical anesthesia for hemodialysis if Gebauers and Lidocaine injection are ineffective.). 30 g 0  . metoCLOPramide (REGLAN) 5 MG tablet Take 1 tablet (5 mg total) by mouth 4 (four) times daily -  before meals and at bedtime.    . metoprolol (LOPRESSOR) 50 MG tablet Take 0.5 tablets (25 mg total) by mouth 2 (two) times daily. 30 tablet 12  . multivitamin (RENA-VIT) TABS  tablet Take 1 tablet by mouth daily.    . ondansetron (ZOFRAN) 4 MG tablet Take 1 tablet (4 mg total) by mouth every 6 (six) hours as needed for nausea. 20 tablet 0  . Oxycodone HCl 10 MG TABS Take 1 tablet (10 mg total) by mouth 4 (four) times daily as needed (pain).    . pentafluoroprop-tetrafluoroeth (GEBAUERS) AERO Apply 1 application topically as needed (topical anesthesia for hemodialysis).  0  . sevelamer carbonate (RENVELA) 800 MG tablet Take 1,600-3,200 mg by mouth See admin instructions. Take 3200 mg by mouth 3 times daily with meals and take 1600 mg by mouth with snacks.  UEA:VWUJW from the symptoms mentioned above,there are no other symptoms referable to all systems reviewed.  Physical Exam: There were no vitals taken for this visit. Constitutional she is awake and alert morbidly obese in no acute distress. Eyes: Pupils react. EOMI. Ears nose mouth and throat: She has missing teeth. Her mucous membranes are moist. Hearing is grossly normal. Cardiovascular: Her heart is regular with soft systolic heart murmur. She has chronic lymphedema of both legs. Respiratory: Normal respiratory effort. Her lungs are clear. Gastrointestinal: Her abdomen is soft obese without masses. Bowel sounds are active. Musculoskeletal: She is still weak in both legs but no evidence of cellulitis now. Skin: Lymphedema of both legs no cellulitic changes on the left leg. Neurological: No focal abnormalities. Psychiatric: Normal mood and affect   No results for input(s): WBC, NEUTROABS, HGB, HCT, MCV, PLT in the last 72 hours. No results for input(s): NA, K, CL, CO2, GLUCOSE, BUN, CREATININE, CALCIUM, MG in the last 72 hours.  Invalid input(s): PHOlablast2(ast:2,ALT:2,alkphos:2,bilitot:2,prot:2,albumin:2)@    No results found for this or any previous visit (from the past 240 hour(s)).   Dg Chest 1 View  Result Date: 01/25/2017 CLINICAL DATA:  For comparison with VQ scan EXAM: CHEST 1 VIEW COMPARISON:   01/20/2017 FINDINGS: Cardiomegaly with vascular congestion. Mild interstitial prominence again noted could reflect interstitial edema. No confluent opacities or effusions. No acute bony abnormality. Right internal jugular PICC line remains in place, unchanged. IMPRESSION: Cardiomegaly with vascular congestion and interstitial prominence, likely mild interstitial edema. Electronically Signed   By: Rolm Baptise M.D.   On: 01/25/2017 12:52   US Abdomen Complete  Result Date: 01/27/2017 CLINICAL DATA:  Nausea and vomiting with increasing severity over the past 2 days. History of morbid obesity, end-stage renal disease, diabetes, cholecystitis. EXAM: ABDOMEN ULTRASOUND COMPLETE COMPARISON:  Abdominopelvic CT scan of August 12, 2016. FINDINGS: Gallbladder: The gallbladder is contracted. There is gallbladder wall thickening accentuated by the contracted state. No stones are evident. There is no positive sonographic Murphy's sign. Common bile duct: Diameter: 3.9 mm. Liver: The hepatic echotexture is slightly heterogeneous. There is no discrete mass or ductal dilation. IVC: No abnormality visualized. Pancreas: Visualized portion unremarkable. Spleen: Normal where visualized. Right Kidney: Length: 12.4 cm. Echogenicity within normal limits. No mass or hydronephrosis visualized. Left Kidney: Length: 11.7 cm. Visualization of the renal parenchyma was somewhat limited due to bowel gas and the patient's body habitus. There is no hydronephrosis. Abdominal aorta: No aneurysm visualized. Other findings: No ascites is observed. IMPRESSION: The study is limited due to bowel gas and the patient's body habitus. The gallbladder is contracted. No definite stones are observed. No positive sonographic Murphy's sign. If chronic cholecystitis is suspected clinically, a nuclear medicine hepatobiliary scan with gallbladder ejection fraction determination may be useful. Probable mild fatty infiltrative change of the liver. No acute  intra-abdominal abnormality is observed otherwise. Electronically Signed   By: David  Martinique M.D.   On: 01/27/2017 13:25   Nm Pulmonary Perf And Vent  Result Date: 01/25/2017 CLINICAL DATA:  Cor pulmonale EXAM: NUCLEAR MEDICINE VENTILATION - PERFUSION LUNG SCAN TECHNIQUE: Ventilation images were obtained in multiple projections using inhaled aerosol Tc-[id number]. Perfusion images were obtained in multiple projections after intravenous injection of Tc-935mAA. Patient unable to remove arms from imaging field in order to perform lateral views. RADIOPHARMACEUTICALS:  3.85 mCi Technetium-9942mPA aerosol inhalation and 30 mCi Technetium-57m8m IV COMPARISON:  None Correlation: Chest radiograph 01/25/2017 FINDINGS: Ventilation: Central airway deposition of tracer. Mild diminished ventilation  in LEFT lower lobe. Enlargement of cardiac silhouette. No other segmental or subsegmental ventilatory defects. Perfusion: Matching diminished perfusion in LEFT lower lobe. Enlargement of cardiac silhouette. No additional segmental or subsegmental perfusion defects. Chest radiograph: Enlargement of cardiac silhouette with pulmonary vascular congestion and probable interstitial edema IMPRESSION: Matching diminished ventilation and perfusion in the LEFT lower lobe. Enlargement of cardiac silhouette. Very low probability for pulmonary embolism. Electronically Signed   By: Lavonia Dana M.D.   On: 01/25/2017 13:04   US Venous Img Lower Bilateral  Result Date: 01/25/2017 CLINICAL DATA:  Bilateral lower extremity pain and edema. History of chronic lymphedema and varicose veins. Evaluate for DVT. EXAM: BILATERAL LOWER EXTREMITY VENOUS DOPPLER ULTRASOUND TECHNIQUE: Gray-scale sonography with graded compression, as well as color Doppler and duplex ultrasound were performed to evaluate the lower extremity deep venous systems from the level of the common femoral vein and including the common femoral, femoral, profunda femoral, popliteal and  calf veins including the posterior tibial, peroneal and gastrocnemius veins when visible. The superficial great saphenous vein was also interrogated. Spectral Doppler was utilized to evaluate flow at rest and with distal augmentation maneuvers in the common femoral, femoral and popliteal veins. COMPARISON:  None. FINDINGS: Examination is degraded due to patient body habitus and poor sonographic window. RIGHT LOWER EXTREMITY Common Femoral Vein: No evidence of thrombus. Normal compressibility, respiratory phasicity and response to augmentation. Saphenofemoral Junction: No evidence of thrombus. Normal compressibility and flow on color Doppler imaging. Profunda Femoral Vein: No evidence of thrombus. Normal compressibility and flow on color Doppler imaging. Femoral Vein: No evidence of thrombus. Normal compressibility, respiratory phasicity and response to augmentation. Popliteal Vein: No evidence of thrombus. Normal compressibility, respiratory phasicity and response to augmentation. Calf Veins: No evidence of thrombus. Normal compressibility and flow on color Doppler imaging. Superficial Great Saphenous Vein: No evidence of thrombus. Normal compressibility and flow on color Doppler imaging. Venous Reflux:  None. Other Findings:  None. LEFT LOWER EXTREMITY Common Femoral Vein: No evidence of thrombus. Normal compressibility, respiratory phasicity and response to augmentation. Saphenofemoral Junction: No evidence of thrombus. Normal compressibility and flow on color Doppler imaging. Profunda Femoral Vein: No evidence of thrombus. Normal compressibility and flow on color Doppler imaging. Femoral Vein: No evidence of thrombus. Normal compressibility, respiratory phasicity and response to augmentation. Popliteal Vein: No evidence of thrombus. Normal compressibility, respiratory phasicity and response to augmentation. Calf Veins: No evidence of thrombus. Normal compressibility and flow on color Doppler imaging. Superficial  Great Saphenous Vein: No evidence of thrombus. Normal compressibility and flow on color Doppler imaging. Venous Reflux:  None. Other Findings:  None. IMPRESSION: Body habitus degraded examination without definitive evidence of DVT within either lower extremity. Electronically Signed   By: Sandi Mariscal M.D.   On: 01/25/2017 16:38   US Venous Img Upper Bilat  Result Date: 01/25/2017 CLINICAL DATA:  Cor pulmonale. History of CHF and morbid obesity. History of right jugular vein approach PICC line and dialysis access within the left forearm. Evaluate for DVT. EXAM: BILATERAL UPPER EXTREMITY VENOUS DOPPLER ULTRASOUND TECHNIQUE: Gray-scale sonography with graded compression, as well as color Doppler and duplex ultrasound were performed to evaluate the bilateral upper extremity deep venous systems from the level of the subclavian vein and including the jugular, axillary, basilic, radial, ulnar and upper cephalic vein. Spectral Doppler was utilized to evaluate flow at rest and with distal augmentation maneuvers. COMPARISON:  None. FINDINGS: RIGHT UPPER EXTREMITY Internal Jugular Vein: No evidence of thrombus. Normal compressibility, respiratory phasicity and  response to augmentation. Subclavian Vein: No evidence of thrombus. Normal compressibility, respiratory phasicity and response to augmentation. Axillary Vein: No evidence of thrombus. Normal compressibility, respiratory phasicity and response to augmentation. Cephalic Vein: No evidence of thrombus. Normal compressibility, respiratory phasicity and response to augmentation. Basilic Vein: No evidence of thrombus. Normal compressibility, respiratory phasicity and response to augmentation. Brachial Veins: No evidence of thrombus. Normal compressibility, respiratory phasicity and response to augmentation. Radial Veins: No evidence of thrombus. Normal compressibility, respiratory phasicity and response to augmentation. Ulnar Veins: No evidence of thrombus. Normal  compressibility, respiratory phasicity and response to augmentation. Venous Reflux:  None. Other Findings:  None. LEFT UPPER EXTREMITY Internal Jugular Vein: No evidence of thrombus. Normal compressibility, respiratory phasicity and response to augmentation. Subclavian Vein: No evidence of thrombus. Normal compressibility, respiratory phasicity and response to augmentation. Axillary Vein: No evidence of thrombus. Normal compressibility, respiratory phasicity and response to augmentation. Cephalic Vein: No evidence of thrombus. Normal compressibility, respiratory phasicity and response to augmentation. Basilic Vein: No evidence of thrombus. Normal compressibility, respiratory phasicity and response to augmentation. Brachial Veins: No evidence of thrombus. Normal compressibility, respiratory phasicity and response to augmentation. Radial Veins: No evidence of thrombus. Normal compressibility, respiratory phasicity and response to augmentation. Ulnar Veins: No evidence of thrombus. Normal compressibility, respiratory phasicity and response to augmentation. Venous Reflux:  None. Other Findings: The dialysis graft/fistula within the left forearm appears patent where imaged. IMPRESSION: 1. No evidence of DVT within either upper extremity. 2. The dialysis graft/fistula within left forearm appears patent where imaged. Electronically Signed   By: Sandi Mariscal M.D.   On: 01/25/2017 17:02   Dg Chest Port 1 View  Result Date: 01/20/2017 CLINICAL DATA:  Chest pain. EXAM: PORTABLE CHEST 1 VIEW COMPARISON:  01/19/2017. FINDINGS: Right IJ line noted in stable position. Cardiomegaly with bilateral pulmonary interstitial prominence. Findings consistent with congestive heart failure. Basilar atelectasis. Small left pleural effusion cannot be excluded. No pneumothorax. IMPRESSION: 1. Right IJ line stable position. 2. Cardiomegaly with mild bilateral from interstitial prominence consistent congestive heart failure. Small left pleural  effusion cannot be excluded. 2. Low lung volumes with basilar atelectasis Electronically Signed   By: Marcello Moores  Register   On: 01/20/2017 09:07   Dg Chest Port 1 View  Result Date: 01/19/2017 CLINICAL DATA:  Status post central line placement EXAM: PORTABLE CHEST 1 VIEW COMPARISON:  01/18/2017 FINDINGS: Right jugular line is noted with the catheter tip in the mid to distal superior vena cava. No pneumothorax is noted. The lungs are clear. The cardiac shadow is stable. IMPRESSION: No pneumothorax following right jugular central line placement. Electronically Signed   By: Inez Catalina M.D.   On: 01/19/2017 12:11   Dg Chest Port 1 View  Result Date: 01/18/2017 CLINICAL DATA:  Short of breath, cough, fever and chills today. Pneumonia 2 weeks ago. EXAM: PORTABLE CHEST 1 VIEW COMPARISON:  12/31/2016. FINDINGS: Mild enlargement of the cardiac silhouette, stable. No mediastinal or hilar masses. No convincing adenopathy. Mild vascular congestion. No convincing pulmonary edema. No evidence of pneumonia. No convincing pleural effusion.  No pneumothorax. Skeletal structures are intact. IMPRESSION: No convincing acute cardiopulmonary disease. No definitive pneumonia or pulmonary edema. Electronically Signed   By: Lajean Manes M.D.   On: 01/18/2017 16:02   Impression: She is here for rehabilitation. She has multiple medical problems including end-stage renal disease on dialysis, heart failure which is managed with ultrafiltration, morbid obesity, diabetes, obesity hypoventilation and chronic lymphedema Active Problems:   * No active  hospital problems. *     Plan: Continue rehabilitation. She has made good progress so far and I think she will be able to go home fairly sudden      Haileyann Staiger L   02/06/2017, 9:21 AM

## 2017-02-07 DIAGNOSIS — Z992 Dependence on renal dialysis: Secondary | ICD-10-CM | POA: Diagnosis not present

## 2017-02-07 DIAGNOSIS — R110 Nausea: Secondary | ICD-10-CM | POA: Diagnosis not present

## 2017-02-07 DIAGNOSIS — N186 End stage renal disease: Secondary | ICD-10-CM | POA: Diagnosis not present

## 2017-02-07 DIAGNOSIS — D509 Iron deficiency anemia, unspecified: Secondary | ICD-10-CM | POA: Diagnosis not present

## 2017-02-07 DIAGNOSIS — N2581 Secondary hyperparathyroidism of renal origin: Secondary | ICD-10-CM | POA: Diagnosis not present

## 2017-02-07 DIAGNOSIS — D631 Anemia in chronic kidney disease: Secondary | ICD-10-CM | POA: Diagnosis not present

## 2017-02-09 DIAGNOSIS — D509 Iron deficiency anemia, unspecified: Secondary | ICD-10-CM | POA: Diagnosis not present

## 2017-02-09 DIAGNOSIS — Z992 Dependence on renal dialysis: Secondary | ICD-10-CM | POA: Diagnosis not present

## 2017-02-09 DIAGNOSIS — N186 End stage renal disease: Secondary | ICD-10-CM | POA: Diagnosis not present

## 2017-02-09 DIAGNOSIS — D631 Anemia in chronic kidney disease: Secondary | ICD-10-CM | POA: Diagnosis not present

## 2017-02-09 DIAGNOSIS — R110 Nausea: Secondary | ICD-10-CM | POA: Diagnosis not present

## 2017-02-09 DIAGNOSIS — N2581 Secondary hyperparathyroidism of renal origin: Secondary | ICD-10-CM | POA: Diagnosis not present

## 2017-02-11 DIAGNOSIS — R110 Nausea: Secondary | ICD-10-CM | POA: Diagnosis not present

## 2017-02-11 DIAGNOSIS — N186 End stage renal disease: Secondary | ICD-10-CM | POA: Diagnosis not present

## 2017-02-11 DIAGNOSIS — N2581 Secondary hyperparathyroidism of renal origin: Secondary | ICD-10-CM | POA: Diagnosis not present

## 2017-02-11 DIAGNOSIS — D509 Iron deficiency anemia, unspecified: Secondary | ICD-10-CM | POA: Diagnosis not present

## 2017-02-11 DIAGNOSIS — D631 Anemia in chronic kidney disease: Secondary | ICD-10-CM | POA: Diagnosis not present

## 2017-02-11 DIAGNOSIS — Z992 Dependence on renal dialysis: Secondary | ICD-10-CM | POA: Diagnosis not present

## 2017-02-16 DIAGNOSIS — D509 Iron deficiency anemia, unspecified: Secondary | ICD-10-CM | POA: Diagnosis not present

## 2017-02-16 DIAGNOSIS — N2581 Secondary hyperparathyroidism of renal origin: Secondary | ICD-10-CM | POA: Diagnosis not present

## 2017-02-16 DIAGNOSIS — R110 Nausea: Secondary | ICD-10-CM | POA: Diagnosis not present

## 2017-02-16 DIAGNOSIS — Z992 Dependence on renal dialysis: Secondary | ICD-10-CM | POA: Diagnosis not present

## 2017-02-16 DIAGNOSIS — N186 End stage renal disease: Secondary | ICD-10-CM | POA: Diagnosis not present

## 2017-02-16 DIAGNOSIS — D631 Anemia in chronic kidney disease: Secondary | ICD-10-CM | POA: Diagnosis not present

## 2017-02-19 DIAGNOSIS — Z23 Encounter for immunization: Secondary | ICD-10-CM | POA: Diagnosis not present

## 2017-02-19 DIAGNOSIS — N2581 Secondary hyperparathyroidism of renal origin: Secondary | ICD-10-CM | POA: Diagnosis not present

## 2017-02-19 DIAGNOSIS — Z992 Dependence on renal dialysis: Secondary | ICD-10-CM | POA: Diagnosis not present

## 2017-02-19 DIAGNOSIS — N186 End stage renal disease: Secondary | ICD-10-CM | POA: Diagnosis not present

## 2017-02-19 DIAGNOSIS — D631 Anemia in chronic kidney disease: Secondary | ICD-10-CM | POA: Diagnosis not present

## 2017-02-19 DIAGNOSIS — D509 Iron deficiency anemia, unspecified: Secondary | ICD-10-CM | POA: Diagnosis not present

## 2017-02-20 DIAGNOSIS — E1122 Type 2 diabetes mellitus with diabetic chronic kidney disease: Secondary | ICD-10-CM | POA: Diagnosis not present

## 2017-02-20 DIAGNOSIS — Z992 Dependence on renal dialysis: Secondary | ICD-10-CM | POA: Diagnosis not present

## 2017-02-20 DIAGNOSIS — J9611 Chronic respiratory failure with hypoxia: Secondary | ICD-10-CM | POA: Diagnosis not present

## 2017-02-20 DIAGNOSIS — L03116 Cellulitis of left lower limb: Secondary | ICD-10-CM | POA: Diagnosis not present

## 2017-02-20 DIAGNOSIS — Z794 Long term (current) use of insulin: Secondary | ICD-10-CM | POA: Diagnosis not present

## 2017-02-20 DIAGNOSIS — I890 Lymphedema, not elsewhere classified: Secondary | ICD-10-CM | POA: Diagnosis not present

## 2017-02-20 DIAGNOSIS — N186 End stage renal disease: Secondary | ICD-10-CM | POA: Diagnosis not present

## 2017-02-20 DIAGNOSIS — I5032 Chronic diastolic (congestive) heart failure: Secondary | ICD-10-CM | POA: Diagnosis not present

## 2017-02-20 DIAGNOSIS — E662 Morbid (severe) obesity with alveolar hypoventilation: Secondary | ICD-10-CM | POA: Diagnosis not present

## 2017-02-21 DIAGNOSIS — L03116 Cellulitis of left lower limb: Secondary | ICD-10-CM | POA: Diagnosis not present

## 2017-02-21 DIAGNOSIS — Z23 Encounter for immunization: Secondary | ICD-10-CM | POA: Diagnosis not present

## 2017-02-21 DIAGNOSIS — E662 Morbid (severe) obesity with alveolar hypoventilation: Secondary | ICD-10-CM | POA: Diagnosis not present

## 2017-02-21 DIAGNOSIS — I890 Lymphedema, not elsewhere classified: Secondary | ICD-10-CM | POA: Diagnosis not present

## 2017-02-21 DIAGNOSIS — D509 Iron deficiency anemia, unspecified: Secondary | ICD-10-CM | POA: Diagnosis not present

## 2017-02-21 DIAGNOSIS — I5032 Chronic diastolic (congestive) heart failure: Secondary | ICD-10-CM | POA: Diagnosis not present

## 2017-02-21 DIAGNOSIS — E1122 Type 2 diabetes mellitus with diabetic chronic kidney disease: Secondary | ICD-10-CM | POA: Diagnosis not present

## 2017-02-21 DIAGNOSIS — N2581 Secondary hyperparathyroidism of renal origin: Secondary | ICD-10-CM | POA: Diagnosis not present

## 2017-02-21 DIAGNOSIS — Z992 Dependence on renal dialysis: Secondary | ICD-10-CM | POA: Diagnosis not present

## 2017-02-21 DIAGNOSIS — D631 Anemia in chronic kidney disease: Secondary | ICD-10-CM | POA: Diagnosis not present

## 2017-02-21 DIAGNOSIS — N186 End stage renal disease: Secondary | ICD-10-CM | POA: Diagnosis not present

## 2017-02-22 DIAGNOSIS — I5032 Chronic diastolic (congestive) heart failure: Secondary | ICD-10-CM | POA: Diagnosis not present

## 2017-02-22 DIAGNOSIS — L03116 Cellulitis of left lower limb: Secondary | ICD-10-CM | POA: Diagnosis not present

## 2017-02-22 DIAGNOSIS — E1122 Type 2 diabetes mellitus with diabetic chronic kidney disease: Secondary | ICD-10-CM | POA: Diagnosis not present

## 2017-02-22 DIAGNOSIS — E662 Morbid (severe) obesity with alveolar hypoventilation: Secondary | ICD-10-CM | POA: Diagnosis not present

## 2017-02-22 DIAGNOSIS — N186 End stage renal disease: Secondary | ICD-10-CM | POA: Diagnosis not present

## 2017-02-22 DIAGNOSIS — I890 Lymphedema, not elsewhere classified: Secondary | ICD-10-CM | POA: Diagnosis not present

## 2017-02-23 DIAGNOSIS — D631 Anemia in chronic kidney disease: Secondary | ICD-10-CM | POA: Diagnosis not present

## 2017-02-23 DIAGNOSIS — D509 Iron deficiency anemia, unspecified: Secondary | ICD-10-CM | POA: Diagnosis not present

## 2017-02-23 DIAGNOSIS — Z23 Encounter for immunization: Secondary | ICD-10-CM | POA: Diagnosis not present

## 2017-02-23 DIAGNOSIS — N2581 Secondary hyperparathyroidism of renal origin: Secondary | ICD-10-CM | POA: Diagnosis not present

## 2017-02-23 DIAGNOSIS — Z992 Dependence on renal dialysis: Secondary | ICD-10-CM | POA: Diagnosis not present

## 2017-02-23 DIAGNOSIS — N186 End stage renal disease: Secondary | ICD-10-CM | POA: Diagnosis not present

## 2017-02-24 DIAGNOSIS — E1122 Type 2 diabetes mellitus with diabetic chronic kidney disease: Secondary | ICD-10-CM | POA: Diagnosis not present

## 2017-02-24 DIAGNOSIS — E662 Morbid (severe) obesity with alveolar hypoventilation: Secondary | ICD-10-CM | POA: Diagnosis not present

## 2017-02-24 DIAGNOSIS — N186 End stage renal disease: Secondary | ICD-10-CM | POA: Diagnosis not present

## 2017-02-24 DIAGNOSIS — L03116 Cellulitis of left lower limb: Secondary | ICD-10-CM | POA: Diagnosis not present

## 2017-02-24 DIAGNOSIS — I5032 Chronic diastolic (congestive) heart failure: Secondary | ICD-10-CM | POA: Diagnosis not present

## 2017-02-24 DIAGNOSIS — I890 Lymphedema, not elsewhere classified: Secondary | ICD-10-CM | POA: Diagnosis not present

## 2017-02-25 DIAGNOSIS — N2581 Secondary hyperparathyroidism of renal origin: Secondary | ICD-10-CM | POA: Diagnosis not present

## 2017-02-25 DIAGNOSIS — D509 Iron deficiency anemia, unspecified: Secondary | ICD-10-CM | POA: Diagnosis not present

## 2017-02-25 DIAGNOSIS — Z23 Encounter for immunization: Secondary | ICD-10-CM | POA: Diagnosis not present

## 2017-02-25 DIAGNOSIS — D631 Anemia in chronic kidney disease: Secondary | ICD-10-CM | POA: Diagnosis not present

## 2017-02-25 DIAGNOSIS — Z992 Dependence on renal dialysis: Secondary | ICD-10-CM | POA: Diagnosis not present

## 2017-02-25 DIAGNOSIS — N186 End stage renal disease: Secondary | ICD-10-CM | POA: Diagnosis not present

## 2017-02-28 DIAGNOSIS — N186 End stage renal disease: Secondary | ICD-10-CM | POA: Diagnosis not present

## 2017-02-28 DIAGNOSIS — D631 Anemia in chronic kidney disease: Secondary | ICD-10-CM | POA: Diagnosis not present

## 2017-02-28 DIAGNOSIS — Z23 Encounter for immunization: Secondary | ICD-10-CM | POA: Diagnosis not present

## 2017-02-28 DIAGNOSIS — N2581 Secondary hyperparathyroidism of renal origin: Secondary | ICD-10-CM | POA: Diagnosis not present

## 2017-02-28 DIAGNOSIS — D509 Iron deficiency anemia, unspecified: Secondary | ICD-10-CM | POA: Diagnosis not present

## 2017-02-28 DIAGNOSIS — Z992 Dependence on renal dialysis: Secondary | ICD-10-CM | POA: Diagnosis not present

## 2017-03-01 DIAGNOSIS — N186 End stage renal disease: Secondary | ICD-10-CM | POA: Diagnosis not present

## 2017-03-01 DIAGNOSIS — E662 Morbid (severe) obesity with alveolar hypoventilation: Secondary | ICD-10-CM | POA: Diagnosis not present

## 2017-03-01 DIAGNOSIS — L03116 Cellulitis of left lower limb: Secondary | ICD-10-CM | POA: Diagnosis not present

## 2017-03-01 DIAGNOSIS — I5032 Chronic diastolic (congestive) heart failure: Secondary | ICD-10-CM | POA: Diagnosis not present

## 2017-03-01 DIAGNOSIS — E1122 Type 2 diabetes mellitus with diabetic chronic kidney disease: Secondary | ICD-10-CM | POA: Diagnosis not present

## 2017-03-01 DIAGNOSIS — I890 Lymphedema, not elsewhere classified: Secondary | ICD-10-CM | POA: Diagnosis not present

## 2017-03-02 DIAGNOSIS — N186 End stage renal disease: Secondary | ICD-10-CM | POA: Diagnosis not present

## 2017-03-02 DIAGNOSIS — N2581 Secondary hyperparathyroidism of renal origin: Secondary | ICD-10-CM | POA: Diagnosis not present

## 2017-03-02 DIAGNOSIS — E662 Morbid (severe) obesity with alveolar hypoventilation: Secondary | ICD-10-CM | POA: Diagnosis not present

## 2017-03-02 DIAGNOSIS — D509 Iron deficiency anemia, unspecified: Secondary | ICD-10-CM | POA: Diagnosis not present

## 2017-03-02 DIAGNOSIS — L03116 Cellulitis of left lower limb: Secondary | ICD-10-CM | POA: Diagnosis not present

## 2017-03-02 DIAGNOSIS — I5032 Chronic diastolic (congestive) heart failure: Secondary | ICD-10-CM | POA: Diagnosis not present

## 2017-03-02 DIAGNOSIS — Z992 Dependence on renal dialysis: Secondary | ICD-10-CM | POA: Diagnosis not present

## 2017-03-02 DIAGNOSIS — Z23 Encounter for immunization: Secondary | ICD-10-CM | POA: Diagnosis not present

## 2017-03-02 DIAGNOSIS — D631 Anemia in chronic kidney disease: Secondary | ICD-10-CM | POA: Diagnosis not present

## 2017-03-02 DIAGNOSIS — I890 Lymphedema, not elsewhere classified: Secondary | ICD-10-CM | POA: Diagnosis not present

## 2017-03-02 DIAGNOSIS — E1122 Type 2 diabetes mellitus with diabetic chronic kidney disease: Secondary | ICD-10-CM | POA: Diagnosis not present

## 2017-03-03 DIAGNOSIS — E662 Morbid (severe) obesity with alveolar hypoventilation: Secondary | ICD-10-CM | POA: Diagnosis not present

## 2017-03-03 DIAGNOSIS — E1122 Type 2 diabetes mellitus with diabetic chronic kidney disease: Secondary | ICD-10-CM | POA: Diagnosis not present

## 2017-03-03 DIAGNOSIS — I890 Lymphedema, not elsewhere classified: Secondary | ICD-10-CM | POA: Diagnosis not present

## 2017-03-03 DIAGNOSIS — L03116 Cellulitis of left lower limb: Secondary | ICD-10-CM | POA: Diagnosis not present

## 2017-03-03 DIAGNOSIS — N186 End stage renal disease: Secondary | ICD-10-CM | POA: Diagnosis not present

## 2017-03-03 DIAGNOSIS — I5032 Chronic diastolic (congestive) heart failure: Secondary | ICD-10-CM | POA: Diagnosis not present

## 2017-03-04 DIAGNOSIS — E662 Morbid (severe) obesity with alveolar hypoventilation: Secondary | ICD-10-CM | POA: Diagnosis not present

## 2017-03-04 DIAGNOSIS — N2581 Secondary hyperparathyroidism of renal origin: Secondary | ICD-10-CM | POA: Diagnosis not present

## 2017-03-04 DIAGNOSIS — L03116 Cellulitis of left lower limb: Secondary | ICD-10-CM | POA: Diagnosis not present

## 2017-03-04 DIAGNOSIS — I890 Lymphedema, not elsewhere classified: Secondary | ICD-10-CM | POA: Diagnosis not present

## 2017-03-04 DIAGNOSIS — Z23 Encounter for immunization: Secondary | ICD-10-CM | POA: Diagnosis not present

## 2017-03-04 DIAGNOSIS — E1122 Type 2 diabetes mellitus with diabetic chronic kidney disease: Secondary | ICD-10-CM | POA: Diagnosis not present

## 2017-03-04 DIAGNOSIS — Z992 Dependence on renal dialysis: Secondary | ICD-10-CM | POA: Diagnosis not present

## 2017-03-04 DIAGNOSIS — N186 End stage renal disease: Secondary | ICD-10-CM | POA: Diagnosis not present

## 2017-03-04 DIAGNOSIS — D509 Iron deficiency anemia, unspecified: Secondary | ICD-10-CM | POA: Diagnosis not present

## 2017-03-04 DIAGNOSIS — D631 Anemia in chronic kidney disease: Secondary | ICD-10-CM | POA: Diagnosis not present

## 2017-03-04 DIAGNOSIS — I5032 Chronic diastolic (congestive) heart failure: Secondary | ICD-10-CM | POA: Diagnosis not present

## 2017-03-07 DIAGNOSIS — D631 Anemia in chronic kidney disease: Secondary | ICD-10-CM | POA: Diagnosis not present

## 2017-03-07 DIAGNOSIS — Z23 Encounter for immunization: Secondary | ICD-10-CM | POA: Diagnosis not present

## 2017-03-07 DIAGNOSIS — N2581 Secondary hyperparathyroidism of renal origin: Secondary | ICD-10-CM | POA: Diagnosis not present

## 2017-03-07 DIAGNOSIS — D509 Iron deficiency anemia, unspecified: Secondary | ICD-10-CM | POA: Diagnosis not present

## 2017-03-07 DIAGNOSIS — Z992 Dependence on renal dialysis: Secondary | ICD-10-CM | POA: Diagnosis not present

## 2017-03-07 DIAGNOSIS — N186 End stage renal disease: Secondary | ICD-10-CM | POA: Diagnosis not present

## 2017-03-08 DIAGNOSIS — N186 End stage renal disease: Secondary | ICD-10-CM | POA: Diagnosis not present

## 2017-03-08 DIAGNOSIS — E662 Morbid (severe) obesity with alveolar hypoventilation: Secondary | ICD-10-CM | POA: Diagnosis not present

## 2017-03-08 DIAGNOSIS — L03116 Cellulitis of left lower limb: Secondary | ICD-10-CM | POA: Diagnosis not present

## 2017-03-08 DIAGNOSIS — I890 Lymphedema, not elsewhere classified: Secondary | ICD-10-CM | POA: Diagnosis not present

## 2017-03-08 DIAGNOSIS — E1122 Type 2 diabetes mellitus with diabetic chronic kidney disease: Secondary | ICD-10-CM | POA: Diagnosis not present

## 2017-03-08 DIAGNOSIS — I5032 Chronic diastolic (congestive) heart failure: Secondary | ICD-10-CM | POA: Diagnosis not present

## 2017-03-10 DIAGNOSIS — L03116 Cellulitis of left lower limb: Secondary | ICD-10-CM | POA: Diagnosis not present

## 2017-03-10 DIAGNOSIS — D509 Iron deficiency anemia, unspecified: Secondary | ICD-10-CM | POA: Diagnosis not present

## 2017-03-10 DIAGNOSIS — Z992 Dependence on renal dialysis: Secondary | ICD-10-CM | POA: Diagnosis not present

## 2017-03-10 DIAGNOSIS — I890 Lymphedema, not elsewhere classified: Secondary | ICD-10-CM | POA: Diagnosis not present

## 2017-03-10 DIAGNOSIS — D631 Anemia in chronic kidney disease: Secondary | ICD-10-CM | POA: Diagnosis not present

## 2017-03-10 DIAGNOSIS — E1122 Type 2 diabetes mellitus with diabetic chronic kidney disease: Secondary | ICD-10-CM | POA: Diagnosis not present

## 2017-03-10 DIAGNOSIS — N2581 Secondary hyperparathyroidism of renal origin: Secondary | ICD-10-CM | POA: Diagnosis not present

## 2017-03-10 DIAGNOSIS — Z23 Encounter for immunization: Secondary | ICD-10-CM | POA: Diagnosis not present

## 2017-03-10 DIAGNOSIS — N186 End stage renal disease: Secondary | ICD-10-CM | POA: Diagnosis not present

## 2017-03-10 DIAGNOSIS — I5032 Chronic diastolic (congestive) heart failure: Secondary | ICD-10-CM | POA: Diagnosis not present

## 2017-03-10 DIAGNOSIS — E662 Morbid (severe) obesity with alveolar hypoventilation: Secondary | ICD-10-CM | POA: Diagnosis not present

## 2017-03-11 DIAGNOSIS — Z23 Encounter for immunization: Secondary | ICD-10-CM | POA: Diagnosis not present

## 2017-03-11 DIAGNOSIS — N2581 Secondary hyperparathyroidism of renal origin: Secondary | ICD-10-CM | POA: Diagnosis not present

## 2017-03-11 DIAGNOSIS — N186 End stage renal disease: Secondary | ICD-10-CM | POA: Diagnosis not present

## 2017-03-11 DIAGNOSIS — D509 Iron deficiency anemia, unspecified: Secondary | ICD-10-CM | POA: Diagnosis not present

## 2017-03-11 DIAGNOSIS — D631 Anemia in chronic kidney disease: Secondary | ICD-10-CM | POA: Diagnosis not present

## 2017-03-11 DIAGNOSIS — Z992 Dependence on renal dialysis: Secondary | ICD-10-CM | POA: Diagnosis not present

## 2017-03-14 DIAGNOSIS — N2581 Secondary hyperparathyroidism of renal origin: Secondary | ICD-10-CM | POA: Diagnosis not present

## 2017-03-14 DIAGNOSIS — Z992 Dependence on renal dialysis: Secondary | ICD-10-CM | POA: Diagnosis not present

## 2017-03-14 DIAGNOSIS — D631 Anemia in chronic kidney disease: Secondary | ICD-10-CM | POA: Diagnosis not present

## 2017-03-14 DIAGNOSIS — N186 End stage renal disease: Secondary | ICD-10-CM | POA: Diagnosis not present

## 2017-03-14 DIAGNOSIS — Z23 Encounter for immunization: Secondary | ICD-10-CM | POA: Diagnosis not present

## 2017-03-14 DIAGNOSIS — D509 Iron deficiency anemia, unspecified: Secondary | ICD-10-CM | POA: Diagnosis not present

## 2017-03-16 DIAGNOSIS — I890 Lymphedema, not elsewhere classified: Secondary | ICD-10-CM | POA: Diagnosis not present

## 2017-03-16 DIAGNOSIS — D509 Iron deficiency anemia, unspecified: Secondary | ICD-10-CM | POA: Diagnosis not present

## 2017-03-16 DIAGNOSIS — D631 Anemia in chronic kidney disease: Secondary | ICD-10-CM | POA: Diagnosis not present

## 2017-03-16 DIAGNOSIS — L03116 Cellulitis of left lower limb: Secondary | ICD-10-CM | POA: Diagnosis not present

## 2017-03-16 DIAGNOSIS — E1122 Type 2 diabetes mellitus with diabetic chronic kidney disease: Secondary | ICD-10-CM | POA: Diagnosis not present

## 2017-03-16 DIAGNOSIS — E662 Morbid (severe) obesity with alveolar hypoventilation: Secondary | ICD-10-CM | POA: Diagnosis not present

## 2017-03-16 DIAGNOSIS — Z992 Dependence on renal dialysis: Secondary | ICD-10-CM | POA: Diagnosis not present

## 2017-03-16 DIAGNOSIS — Z23 Encounter for immunization: Secondary | ICD-10-CM | POA: Diagnosis not present

## 2017-03-16 DIAGNOSIS — N186 End stage renal disease: Secondary | ICD-10-CM | POA: Diagnosis not present

## 2017-03-16 DIAGNOSIS — N2581 Secondary hyperparathyroidism of renal origin: Secondary | ICD-10-CM | POA: Diagnosis not present

## 2017-03-16 DIAGNOSIS — I5032 Chronic diastolic (congestive) heart failure: Secondary | ICD-10-CM | POA: Diagnosis not present

## 2017-03-17 DIAGNOSIS — L03116 Cellulitis of left lower limb: Secondary | ICD-10-CM | POA: Diagnosis not present

## 2017-03-17 DIAGNOSIS — N186 End stage renal disease: Secondary | ICD-10-CM | POA: Diagnosis not present

## 2017-03-17 DIAGNOSIS — E662 Morbid (severe) obesity with alveolar hypoventilation: Secondary | ICD-10-CM | POA: Diagnosis not present

## 2017-03-17 DIAGNOSIS — I5032 Chronic diastolic (congestive) heart failure: Secondary | ICD-10-CM | POA: Diagnosis not present

## 2017-03-17 DIAGNOSIS — E1122 Type 2 diabetes mellitus with diabetic chronic kidney disease: Secondary | ICD-10-CM | POA: Diagnosis not present

## 2017-03-17 DIAGNOSIS — I890 Lymphedema, not elsewhere classified: Secondary | ICD-10-CM | POA: Diagnosis not present

## 2017-03-18 DIAGNOSIS — N2581 Secondary hyperparathyroidism of renal origin: Secondary | ICD-10-CM | POA: Diagnosis not present

## 2017-03-18 DIAGNOSIS — Z23 Encounter for immunization: Secondary | ICD-10-CM | POA: Diagnosis not present

## 2017-03-18 DIAGNOSIS — D631 Anemia in chronic kidney disease: Secondary | ICD-10-CM | POA: Diagnosis not present

## 2017-03-18 DIAGNOSIS — D509 Iron deficiency anemia, unspecified: Secondary | ICD-10-CM | POA: Diagnosis not present

## 2017-03-18 DIAGNOSIS — N186 End stage renal disease: Secondary | ICD-10-CM | POA: Diagnosis not present

## 2017-03-18 DIAGNOSIS — Z992 Dependence on renal dialysis: Secondary | ICD-10-CM | POA: Diagnosis not present

## 2017-03-19 DIAGNOSIS — Z992 Dependence on renal dialysis: Secondary | ICD-10-CM | POA: Diagnosis not present

## 2017-03-19 DIAGNOSIS — N186 End stage renal disease: Secondary | ICD-10-CM | POA: Diagnosis not present

## 2017-03-21 DIAGNOSIS — Z23 Encounter for immunization: Secondary | ICD-10-CM | POA: Diagnosis not present

## 2017-03-21 DIAGNOSIS — N186 End stage renal disease: Secondary | ICD-10-CM | POA: Diagnosis not present

## 2017-03-21 DIAGNOSIS — N2581 Secondary hyperparathyroidism of renal origin: Secondary | ICD-10-CM | POA: Diagnosis not present

## 2017-03-21 DIAGNOSIS — D509 Iron deficiency anemia, unspecified: Secondary | ICD-10-CM | POA: Diagnosis not present

## 2017-03-21 DIAGNOSIS — D631 Anemia in chronic kidney disease: Secondary | ICD-10-CM | POA: Diagnosis not present

## 2017-03-21 DIAGNOSIS — Z992 Dependence on renal dialysis: Secondary | ICD-10-CM | POA: Diagnosis not present

## 2017-03-23 DIAGNOSIS — D509 Iron deficiency anemia, unspecified: Secondary | ICD-10-CM | POA: Diagnosis not present

## 2017-03-23 DIAGNOSIS — N2581 Secondary hyperparathyroidism of renal origin: Secondary | ICD-10-CM | POA: Diagnosis not present

## 2017-03-23 DIAGNOSIS — Z992 Dependence on renal dialysis: Secondary | ICD-10-CM | POA: Diagnosis not present

## 2017-03-23 DIAGNOSIS — Z23 Encounter for immunization: Secondary | ICD-10-CM | POA: Diagnosis not present

## 2017-03-23 DIAGNOSIS — D631 Anemia in chronic kidney disease: Secondary | ICD-10-CM | POA: Diagnosis not present

## 2017-03-23 DIAGNOSIS — N186 End stage renal disease: Secondary | ICD-10-CM | POA: Diagnosis not present

## 2017-03-24 DIAGNOSIS — E1122 Type 2 diabetes mellitus with diabetic chronic kidney disease: Secondary | ICD-10-CM | POA: Diagnosis not present

## 2017-03-24 DIAGNOSIS — I890 Lymphedema, not elsewhere classified: Secondary | ICD-10-CM | POA: Diagnosis not present

## 2017-03-24 DIAGNOSIS — N186 End stage renal disease: Secondary | ICD-10-CM | POA: Diagnosis not present

## 2017-03-24 DIAGNOSIS — I5032 Chronic diastolic (congestive) heart failure: Secondary | ICD-10-CM | POA: Diagnosis not present

## 2017-03-24 DIAGNOSIS — E662 Morbid (severe) obesity with alveolar hypoventilation: Secondary | ICD-10-CM | POA: Diagnosis not present

## 2017-03-24 DIAGNOSIS — L03116 Cellulitis of left lower limb: Secondary | ICD-10-CM | POA: Diagnosis not present

## 2017-03-25 DIAGNOSIS — N2581 Secondary hyperparathyroidism of renal origin: Secondary | ICD-10-CM | POA: Diagnosis not present

## 2017-03-25 DIAGNOSIS — D509 Iron deficiency anemia, unspecified: Secondary | ICD-10-CM | POA: Diagnosis not present

## 2017-03-25 DIAGNOSIS — Z992 Dependence on renal dialysis: Secondary | ICD-10-CM | POA: Diagnosis not present

## 2017-03-25 DIAGNOSIS — N186 End stage renal disease: Secondary | ICD-10-CM | POA: Diagnosis not present

## 2017-03-25 DIAGNOSIS — D631 Anemia in chronic kidney disease: Secondary | ICD-10-CM | POA: Diagnosis not present

## 2017-03-25 DIAGNOSIS — Z23 Encounter for immunization: Secondary | ICD-10-CM | POA: Diagnosis not present

## 2017-03-28 DIAGNOSIS — N186 End stage renal disease: Secondary | ICD-10-CM | POA: Diagnosis not present

## 2017-03-28 DIAGNOSIS — Z23 Encounter for immunization: Secondary | ICD-10-CM | POA: Diagnosis not present

## 2017-03-28 DIAGNOSIS — D509 Iron deficiency anemia, unspecified: Secondary | ICD-10-CM | POA: Diagnosis not present

## 2017-03-28 DIAGNOSIS — D631 Anemia in chronic kidney disease: Secondary | ICD-10-CM | POA: Diagnosis not present

## 2017-03-28 DIAGNOSIS — N2581 Secondary hyperparathyroidism of renal origin: Secondary | ICD-10-CM | POA: Diagnosis not present

## 2017-03-28 DIAGNOSIS — Z992 Dependence on renal dialysis: Secondary | ICD-10-CM | POA: Diagnosis not present

## 2017-03-29 DIAGNOSIS — I5032 Chronic diastolic (congestive) heart failure: Secondary | ICD-10-CM | POA: Diagnosis not present

## 2017-03-29 DIAGNOSIS — E1122 Type 2 diabetes mellitus with diabetic chronic kidney disease: Secondary | ICD-10-CM | POA: Diagnosis not present

## 2017-03-29 DIAGNOSIS — L03116 Cellulitis of left lower limb: Secondary | ICD-10-CM | POA: Diagnosis not present

## 2017-03-29 DIAGNOSIS — E662 Morbid (severe) obesity with alveolar hypoventilation: Secondary | ICD-10-CM | POA: Diagnosis not present

## 2017-03-29 DIAGNOSIS — I890 Lymphedema, not elsewhere classified: Secondary | ICD-10-CM | POA: Diagnosis not present

## 2017-03-29 DIAGNOSIS — N186 End stage renal disease: Secondary | ICD-10-CM | POA: Diagnosis not present

## 2017-03-30 DIAGNOSIS — D509 Iron deficiency anemia, unspecified: Secondary | ICD-10-CM | POA: Diagnosis not present

## 2017-03-30 DIAGNOSIS — Z23 Encounter for immunization: Secondary | ICD-10-CM | POA: Diagnosis not present

## 2017-03-30 DIAGNOSIS — N2581 Secondary hyperparathyroidism of renal origin: Secondary | ICD-10-CM | POA: Diagnosis not present

## 2017-03-30 DIAGNOSIS — N186 End stage renal disease: Secondary | ICD-10-CM | POA: Diagnosis not present

## 2017-03-30 DIAGNOSIS — Z992 Dependence on renal dialysis: Secondary | ICD-10-CM | POA: Diagnosis not present

## 2017-03-30 DIAGNOSIS — D631 Anemia in chronic kidney disease: Secondary | ICD-10-CM | POA: Diagnosis not present

## 2017-04-01 DIAGNOSIS — Z23 Encounter for immunization: Secondary | ICD-10-CM | POA: Diagnosis not present

## 2017-04-01 DIAGNOSIS — N2581 Secondary hyperparathyroidism of renal origin: Secondary | ICD-10-CM | POA: Diagnosis not present

## 2017-04-01 DIAGNOSIS — N186 End stage renal disease: Secondary | ICD-10-CM | POA: Diagnosis not present

## 2017-04-01 DIAGNOSIS — D631 Anemia in chronic kidney disease: Secondary | ICD-10-CM | POA: Diagnosis not present

## 2017-04-01 DIAGNOSIS — Z992 Dependence on renal dialysis: Secondary | ICD-10-CM | POA: Diagnosis not present

## 2017-04-01 DIAGNOSIS — D509 Iron deficiency anemia, unspecified: Secondary | ICD-10-CM | POA: Diagnosis not present

## 2017-04-04 DIAGNOSIS — Z23 Encounter for immunization: Secondary | ICD-10-CM | POA: Diagnosis not present

## 2017-04-04 DIAGNOSIS — Z992 Dependence on renal dialysis: Secondary | ICD-10-CM | POA: Diagnosis not present

## 2017-04-04 DIAGNOSIS — N2581 Secondary hyperparathyroidism of renal origin: Secondary | ICD-10-CM | POA: Diagnosis not present

## 2017-04-04 DIAGNOSIS — N186 End stage renal disease: Secondary | ICD-10-CM | POA: Diagnosis not present

## 2017-04-04 DIAGNOSIS — D631 Anemia in chronic kidney disease: Secondary | ICD-10-CM | POA: Diagnosis not present

## 2017-04-04 DIAGNOSIS — D509 Iron deficiency anemia, unspecified: Secondary | ICD-10-CM | POA: Diagnosis not present

## 2017-04-06 DIAGNOSIS — E662 Morbid (severe) obesity with alveolar hypoventilation: Secondary | ICD-10-CM | POA: Diagnosis not present

## 2017-04-06 DIAGNOSIS — Z23 Encounter for immunization: Secondary | ICD-10-CM | POA: Diagnosis not present

## 2017-04-06 DIAGNOSIS — N2581 Secondary hyperparathyroidism of renal origin: Secondary | ICD-10-CM | POA: Diagnosis not present

## 2017-04-06 DIAGNOSIS — Z992 Dependence on renal dialysis: Secondary | ICD-10-CM | POA: Diagnosis not present

## 2017-04-06 DIAGNOSIS — D631 Anemia in chronic kidney disease: Secondary | ICD-10-CM | POA: Diagnosis not present

## 2017-04-06 DIAGNOSIS — D509 Iron deficiency anemia, unspecified: Secondary | ICD-10-CM | POA: Diagnosis not present

## 2017-04-06 DIAGNOSIS — N186 End stage renal disease: Secondary | ICD-10-CM | POA: Diagnosis not present

## 2017-04-06 DIAGNOSIS — E1122 Type 2 diabetes mellitus with diabetic chronic kidney disease: Secondary | ICD-10-CM | POA: Diagnosis not present

## 2017-04-06 DIAGNOSIS — L03116 Cellulitis of left lower limb: Secondary | ICD-10-CM | POA: Diagnosis not present

## 2017-04-06 DIAGNOSIS — I890 Lymphedema, not elsewhere classified: Secondary | ICD-10-CM | POA: Diagnosis not present

## 2017-04-06 DIAGNOSIS — I5032 Chronic diastolic (congestive) heart failure: Secondary | ICD-10-CM | POA: Diagnosis not present

## 2017-04-08 DIAGNOSIS — N186 End stage renal disease: Secondary | ICD-10-CM | POA: Diagnosis not present

## 2017-04-08 DIAGNOSIS — N2581 Secondary hyperparathyroidism of renal origin: Secondary | ICD-10-CM | POA: Diagnosis not present

## 2017-04-08 DIAGNOSIS — Z992 Dependence on renal dialysis: Secondary | ICD-10-CM | POA: Diagnosis not present

## 2017-04-08 DIAGNOSIS — D631 Anemia in chronic kidney disease: Secondary | ICD-10-CM | POA: Diagnosis not present

## 2017-04-08 DIAGNOSIS — D509 Iron deficiency anemia, unspecified: Secondary | ICD-10-CM | POA: Diagnosis not present

## 2017-04-08 DIAGNOSIS — Z23 Encounter for immunization: Secondary | ICD-10-CM | POA: Diagnosis not present

## 2017-04-11 DIAGNOSIS — D631 Anemia in chronic kidney disease: Secondary | ICD-10-CM | POA: Diagnosis not present

## 2017-04-11 DIAGNOSIS — N186 End stage renal disease: Secondary | ICD-10-CM | POA: Diagnosis not present

## 2017-04-11 DIAGNOSIS — Z992 Dependence on renal dialysis: Secondary | ICD-10-CM | POA: Diagnosis not present

## 2017-04-11 DIAGNOSIS — N2581 Secondary hyperparathyroidism of renal origin: Secondary | ICD-10-CM | POA: Diagnosis not present

## 2017-04-11 DIAGNOSIS — Z23 Encounter for immunization: Secondary | ICD-10-CM | POA: Diagnosis not present

## 2017-04-11 DIAGNOSIS — D509 Iron deficiency anemia, unspecified: Secondary | ICD-10-CM | POA: Diagnosis not present

## 2017-04-13 DIAGNOSIS — N2581 Secondary hyperparathyroidism of renal origin: Secondary | ICD-10-CM | POA: Diagnosis not present

## 2017-04-13 DIAGNOSIS — N186 End stage renal disease: Secondary | ICD-10-CM | POA: Diagnosis not present

## 2017-04-13 DIAGNOSIS — Z992 Dependence on renal dialysis: Secondary | ICD-10-CM | POA: Diagnosis not present

## 2017-04-13 DIAGNOSIS — D631 Anemia in chronic kidney disease: Secondary | ICD-10-CM | POA: Diagnosis not present

## 2017-04-13 DIAGNOSIS — Z23 Encounter for immunization: Secondary | ICD-10-CM | POA: Diagnosis not present

## 2017-04-13 DIAGNOSIS — D509 Iron deficiency anemia, unspecified: Secondary | ICD-10-CM | POA: Diagnosis not present

## 2017-04-14 DIAGNOSIS — I5032 Chronic diastolic (congestive) heart failure: Secondary | ICD-10-CM | POA: Diagnosis not present

## 2017-04-14 DIAGNOSIS — N186 End stage renal disease: Secondary | ICD-10-CM | POA: Diagnosis not present

## 2017-04-14 DIAGNOSIS — E1122 Type 2 diabetes mellitus with diabetic chronic kidney disease: Secondary | ICD-10-CM | POA: Diagnosis not present

## 2017-04-14 DIAGNOSIS — I890 Lymphedema, not elsewhere classified: Secondary | ICD-10-CM | POA: Diagnosis not present

## 2017-04-14 DIAGNOSIS — E662 Morbid (severe) obesity with alveolar hypoventilation: Secondary | ICD-10-CM | POA: Diagnosis not present

## 2017-04-14 DIAGNOSIS — L03116 Cellulitis of left lower limb: Secondary | ICD-10-CM | POA: Diagnosis not present

## 2017-04-15 DIAGNOSIS — N186 End stage renal disease: Secondary | ICD-10-CM | POA: Diagnosis not present

## 2017-04-15 DIAGNOSIS — N2581 Secondary hyperparathyroidism of renal origin: Secondary | ICD-10-CM | POA: Diagnosis not present

## 2017-04-15 DIAGNOSIS — Z992 Dependence on renal dialysis: Secondary | ICD-10-CM | POA: Diagnosis not present

## 2017-04-15 DIAGNOSIS — D509 Iron deficiency anemia, unspecified: Secondary | ICD-10-CM | POA: Diagnosis not present

## 2017-04-15 DIAGNOSIS — D631 Anemia in chronic kidney disease: Secondary | ICD-10-CM | POA: Diagnosis not present

## 2017-04-15 DIAGNOSIS — Z23 Encounter for immunization: Secondary | ICD-10-CM | POA: Diagnosis not present

## 2017-04-18 DIAGNOSIS — D631 Anemia in chronic kidney disease: Secondary | ICD-10-CM | POA: Diagnosis not present

## 2017-04-18 DIAGNOSIS — Z23 Encounter for immunization: Secondary | ICD-10-CM | POA: Diagnosis not present

## 2017-04-18 DIAGNOSIS — D509 Iron deficiency anemia, unspecified: Secondary | ICD-10-CM | POA: Diagnosis not present

## 2017-04-18 DIAGNOSIS — N2581 Secondary hyperparathyroidism of renal origin: Secondary | ICD-10-CM | POA: Diagnosis not present

## 2017-04-18 DIAGNOSIS — Z992 Dependence on renal dialysis: Secondary | ICD-10-CM | POA: Diagnosis not present

## 2017-04-18 DIAGNOSIS — N186 End stage renal disease: Secondary | ICD-10-CM | POA: Diagnosis not present

## 2017-04-19 DIAGNOSIS — I120 Hypertensive chronic kidney disease with stage 5 chronic kidney disease or end stage renal disease: Secondary | ICD-10-CM | POA: Diagnosis not present

## 2017-04-19 DIAGNOSIS — N185 Chronic kidney disease, stage 5: Secondary | ICD-10-CM | POA: Diagnosis not present

## 2017-04-19 DIAGNOSIS — I890 Lymphedema, not elsewhere classified: Secondary | ICD-10-CM | POA: Diagnosis not present

## 2017-04-19 DIAGNOSIS — N186 End stage renal disease: Secondary | ICD-10-CM | POA: Diagnosis not present

## 2017-04-19 DIAGNOSIS — E662 Morbid (severe) obesity with alveolar hypoventilation: Secondary | ICD-10-CM | POA: Diagnosis not present

## 2017-04-19 DIAGNOSIS — I5032 Chronic diastolic (congestive) heart failure: Secondary | ICD-10-CM | POA: Diagnosis not present

## 2017-04-19 DIAGNOSIS — I10 Essential (primary) hypertension: Secondary | ICD-10-CM | POA: Diagnosis not present

## 2017-04-19 DIAGNOSIS — E1121 Type 2 diabetes mellitus with diabetic nephropathy: Secondary | ICD-10-CM | POA: Diagnosis not present

## 2017-04-19 DIAGNOSIS — L03116 Cellulitis of left lower limb: Secondary | ICD-10-CM | POA: Diagnosis not present

## 2017-04-19 DIAGNOSIS — E1122 Type 2 diabetes mellitus with diabetic chronic kidney disease: Secondary | ICD-10-CM | POA: Diagnosis not present

## 2017-04-20 DIAGNOSIS — Z992 Dependence on renal dialysis: Secondary | ICD-10-CM | POA: Diagnosis not present

## 2017-04-20 DIAGNOSIS — N186 End stage renal disease: Secondary | ICD-10-CM | POA: Diagnosis not present

## 2017-04-20 DIAGNOSIS — N2581 Secondary hyperparathyroidism of renal origin: Secondary | ICD-10-CM | POA: Diagnosis not present

## 2017-04-20 DIAGNOSIS — D509 Iron deficiency anemia, unspecified: Secondary | ICD-10-CM | POA: Diagnosis not present

## 2017-04-20 DIAGNOSIS — Z23 Encounter for immunization: Secondary | ICD-10-CM | POA: Diagnosis not present

## 2017-04-20 DIAGNOSIS — D631 Anemia in chronic kidney disease: Secondary | ICD-10-CM | POA: Diagnosis not present

## 2017-04-22 DIAGNOSIS — D631 Anemia in chronic kidney disease: Secondary | ICD-10-CM | POA: Diagnosis not present

## 2017-04-22 DIAGNOSIS — N2581 Secondary hyperparathyroidism of renal origin: Secondary | ICD-10-CM | POA: Diagnosis not present

## 2017-04-22 DIAGNOSIS — N186 End stage renal disease: Secondary | ICD-10-CM | POA: Diagnosis not present

## 2017-04-22 DIAGNOSIS — Z992 Dependence on renal dialysis: Secondary | ICD-10-CM | POA: Diagnosis not present

## 2017-04-22 DIAGNOSIS — D509 Iron deficiency anemia, unspecified: Secondary | ICD-10-CM | POA: Diagnosis not present

## 2017-04-22 DIAGNOSIS — Z23 Encounter for immunization: Secondary | ICD-10-CM | POA: Diagnosis not present

## 2017-04-25 DIAGNOSIS — D631 Anemia in chronic kidney disease: Secondary | ICD-10-CM | POA: Diagnosis not present

## 2017-04-25 DIAGNOSIS — Z992 Dependence on renal dialysis: Secondary | ICD-10-CM | POA: Diagnosis not present

## 2017-04-25 DIAGNOSIS — Z23 Encounter for immunization: Secondary | ICD-10-CM | POA: Diagnosis not present

## 2017-04-25 DIAGNOSIS — N186 End stage renal disease: Secondary | ICD-10-CM | POA: Diagnosis not present

## 2017-04-25 DIAGNOSIS — D509 Iron deficiency anemia, unspecified: Secondary | ICD-10-CM | POA: Diagnosis not present

## 2017-04-25 DIAGNOSIS — N2581 Secondary hyperparathyroidism of renal origin: Secondary | ICD-10-CM | POA: Diagnosis not present

## 2017-04-27 DIAGNOSIS — D631 Anemia in chronic kidney disease: Secondary | ICD-10-CM | POA: Diagnosis not present

## 2017-04-27 DIAGNOSIS — D509 Iron deficiency anemia, unspecified: Secondary | ICD-10-CM | POA: Diagnosis not present

## 2017-04-27 DIAGNOSIS — Z992 Dependence on renal dialysis: Secondary | ICD-10-CM | POA: Diagnosis not present

## 2017-04-27 DIAGNOSIS — N2581 Secondary hyperparathyroidism of renal origin: Secondary | ICD-10-CM | POA: Diagnosis not present

## 2017-04-27 DIAGNOSIS — Z23 Encounter for immunization: Secondary | ICD-10-CM | POA: Diagnosis not present

## 2017-04-27 DIAGNOSIS — N186 End stage renal disease: Secondary | ICD-10-CM | POA: Diagnosis not present

## 2017-04-29 DIAGNOSIS — Z23 Encounter for immunization: Secondary | ICD-10-CM | POA: Diagnosis not present

## 2017-04-29 DIAGNOSIS — D631 Anemia in chronic kidney disease: Secondary | ICD-10-CM | POA: Diagnosis not present

## 2017-04-29 DIAGNOSIS — Z992 Dependence on renal dialysis: Secondary | ICD-10-CM | POA: Diagnosis not present

## 2017-04-29 DIAGNOSIS — D509 Iron deficiency anemia, unspecified: Secondary | ICD-10-CM | POA: Diagnosis not present

## 2017-04-29 DIAGNOSIS — N186 End stage renal disease: Secondary | ICD-10-CM | POA: Diagnosis not present

## 2017-04-29 DIAGNOSIS — N2581 Secondary hyperparathyroidism of renal origin: Secondary | ICD-10-CM | POA: Diagnosis not present

## 2017-05-02 DIAGNOSIS — D631 Anemia in chronic kidney disease: Secondary | ICD-10-CM | POA: Diagnosis not present

## 2017-05-02 DIAGNOSIS — N2581 Secondary hyperparathyroidism of renal origin: Secondary | ICD-10-CM | POA: Diagnosis not present

## 2017-05-02 DIAGNOSIS — Z992 Dependence on renal dialysis: Secondary | ICD-10-CM | POA: Diagnosis not present

## 2017-05-02 DIAGNOSIS — N186 End stage renal disease: Secondary | ICD-10-CM | POA: Diagnosis not present

## 2017-05-02 DIAGNOSIS — D509 Iron deficiency anemia, unspecified: Secondary | ICD-10-CM | POA: Diagnosis not present

## 2017-05-02 DIAGNOSIS — Z23 Encounter for immunization: Secondary | ICD-10-CM | POA: Diagnosis not present

## 2017-05-04 DIAGNOSIS — N2581 Secondary hyperparathyroidism of renal origin: Secondary | ICD-10-CM | POA: Diagnosis not present

## 2017-05-04 DIAGNOSIS — Z23 Encounter for immunization: Secondary | ICD-10-CM | POA: Diagnosis not present

## 2017-05-04 DIAGNOSIS — Z992 Dependence on renal dialysis: Secondary | ICD-10-CM | POA: Diagnosis not present

## 2017-05-04 DIAGNOSIS — N186 End stage renal disease: Secondary | ICD-10-CM | POA: Diagnosis not present

## 2017-05-04 DIAGNOSIS — D509 Iron deficiency anemia, unspecified: Secondary | ICD-10-CM | POA: Diagnosis not present

## 2017-05-04 DIAGNOSIS — D631 Anemia in chronic kidney disease: Secondary | ICD-10-CM | POA: Diagnosis not present

## 2017-05-06 DIAGNOSIS — Z992 Dependence on renal dialysis: Secondary | ICD-10-CM | POA: Diagnosis not present

## 2017-05-06 DIAGNOSIS — N2581 Secondary hyperparathyroidism of renal origin: Secondary | ICD-10-CM | POA: Diagnosis not present

## 2017-05-06 DIAGNOSIS — N186 End stage renal disease: Secondary | ICD-10-CM | POA: Diagnosis not present

## 2017-05-06 DIAGNOSIS — D631 Anemia in chronic kidney disease: Secondary | ICD-10-CM | POA: Diagnosis not present

## 2017-05-06 DIAGNOSIS — Z23 Encounter for immunization: Secondary | ICD-10-CM | POA: Diagnosis not present

## 2017-05-06 DIAGNOSIS — D509 Iron deficiency anemia, unspecified: Secondary | ICD-10-CM | POA: Diagnosis not present

## 2017-05-09 DIAGNOSIS — Z23 Encounter for immunization: Secondary | ICD-10-CM | POA: Diagnosis not present

## 2017-05-09 DIAGNOSIS — N186 End stage renal disease: Secondary | ICD-10-CM | POA: Diagnosis not present

## 2017-05-09 DIAGNOSIS — N2581 Secondary hyperparathyroidism of renal origin: Secondary | ICD-10-CM | POA: Diagnosis not present

## 2017-05-09 DIAGNOSIS — D509 Iron deficiency anemia, unspecified: Secondary | ICD-10-CM | POA: Diagnosis not present

## 2017-05-09 DIAGNOSIS — D631 Anemia in chronic kidney disease: Secondary | ICD-10-CM | POA: Diagnosis not present

## 2017-05-09 DIAGNOSIS — Z992 Dependence on renal dialysis: Secondary | ICD-10-CM | POA: Diagnosis not present

## 2017-05-11 DIAGNOSIS — N186 End stage renal disease: Secondary | ICD-10-CM | POA: Diagnosis not present

## 2017-05-11 DIAGNOSIS — Z992 Dependence on renal dialysis: Secondary | ICD-10-CM | POA: Diagnosis not present

## 2017-05-11 DIAGNOSIS — D509 Iron deficiency anemia, unspecified: Secondary | ICD-10-CM | POA: Diagnosis not present

## 2017-05-11 DIAGNOSIS — Z23 Encounter for immunization: Secondary | ICD-10-CM | POA: Diagnosis not present

## 2017-05-11 DIAGNOSIS — D631 Anemia in chronic kidney disease: Secondary | ICD-10-CM | POA: Diagnosis not present

## 2017-05-11 DIAGNOSIS — N2581 Secondary hyperparathyroidism of renal origin: Secondary | ICD-10-CM | POA: Diagnosis not present

## 2017-05-13 DIAGNOSIS — Z992 Dependence on renal dialysis: Secondary | ICD-10-CM | POA: Diagnosis not present

## 2017-05-13 DIAGNOSIS — N2581 Secondary hyperparathyroidism of renal origin: Secondary | ICD-10-CM | POA: Diagnosis not present

## 2017-05-13 DIAGNOSIS — D509 Iron deficiency anemia, unspecified: Secondary | ICD-10-CM | POA: Diagnosis not present

## 2017-05-13 DIAGNOSIS — Z23 Encounter for immunization: Secondary | ICD-10-CM | POA: Diagnosis not present

## 2017-05-13 DIAGNOSIS — N186 End stage renal disease: Secondary | ICD-10-CM | POA: Diagnosis not present

## 2017-05-13 DIAGNOSIS — D631 Anemia in chronic kidney disease: Secondary | ICD-10-CM | POA: Diagnosis not present

## 2017-05-16 DIAGNOSIS — D631 Anemia in chronic kidney disease: Secondary | ICD-10-CM | POA: Diagnosis not present

## 2017-05-16 DIAGNOSIS — Z992 Dependence on renal dialysis: Secondary | ICD-10-CM | POA: Diagnosis not present

## 2017-05-16 DIAGNOSIS — Z23 Encounter for immunization: Secondary | ICD-10-CM | POA: Diagnosis not present

## 2017-05-16 DIAGNOSIS — N2581 Secondary hyperparathyroidism of renal origin: Secondary | ICD-10-CM | POA: Diagnosis not present

## 2017-05-16 DIAGNOSIS — N186 End stage renal disease: Secondary | ICD-10-CM | POA: Diagnosis not present

## 2017-05-16 DIAGNOSIS — D509 Iron deficiency anemia, unspecified: Secondary | ICD-10-CM | POA: Diagnosis not present

## 2017-05-18 DIAGNOSIS — N186 End stage renal disease: Secondary | ICD-10-CM | POA: Diagnosis not present

## 2017-05-18 DIAGNOSIS — D631 Anemia in chronic kidney disease: Secondary | ICD-10-CM | POA: Diagnosis not present

## 2017-05-18 DIAGNOSIS — Z23 Encounter for immunization: Secondary | ICD-10-CM | POA: Diagnosis not present

## 2017-05-18 DIAGNOSIS — N2581 Secondary hyperparathyroidism of renal origin: Secondary | ICD-10-CM | POA: Diagnosis not present

## 2017-05-18 DIAGNOSIS — D509 Iron deficiency anemia, unspecified: Secondary | ICD-10-CM | POA: Diagnosis not present

## 2017-05-18 DIAGNOSIS — Z992 Dependence on renal dialysis: Secondary | ICD-10-CM | POA: Diagnosis not present

## 2017-05-19 DIAGNOSIS — Z992 Dependence on renal dialysis: Secondary | ICD-10-CM | POA: Diagnosis not present

## 2017-05-19 DIAGNOSIS — N186 End stage renal disease: Secondary | ICD-10-CM | POA: Diagnosis not present

## 2017-05-20 DIAGNOSIS — N2581 Secondary hyperparathyroidism of renal origin: Secondary | ICD-10-CM | POA: Diagnosis not present

## 2017-05-20 DIAGNOSIS — D631 Anemia in chronic kidney disease: Secondary | ICD-10-CM | POA: Diagnosis not present

## 2017-05-20 DIAGNOSIS — N186 End stage renal disease: Secondary | ICD-10-CM | POA: Diagnosis not present

## 2017-05-20 DIAGNOSIS — D509 Iron deficiency anemia, unspecified: Secondary | ICD-10-CM | POA: Diagnosis not present

## 2017-05-20 DIAGNOSIS — Z992 Dependence on renal dialysis: Secondary | ICD-10-CM | POA: Diagnosis not present

## 2017-05-23 DIAGNOSIS — Z992 Dependence on renal dialysis: Secondary | ICD-10-CM | POA: Diagnosis not present

## 2017-05-23 DIAGNOSIS — N186 End stage renal disease: Secondary | ICD-10-CM | POA: Diagnosis not present

## 2017-05-23 DIAGNOSIS — D509 Iron deficiency anemia, unspecified: Secondary | ICD-10-CM | POA: Diagnosis not present

## 2017-05-23 DIAGNOSIS — N2581 Secondary hyperparathyroidism of renal origin: Secondary | ICD-10-CM | POA: Diagnosis not present

## 2017-05-23 DIAGNOSIS — D631 Anemia in chronic kidney disease: Secondary | ICD-10-CM | POA: Diagnosis not present

## 2017-05-25 DIAGNOSIS — N186 End stage renal disease: Secondary | ICD-10-CM | POA: Diagnosis not present

## 2017-05-25 DIAGNOSIS — D509 Iron deficiency anemia, unspecified: Secondary | ICD-10-CM | POA: Diagnosis not present

## 2017-05-25 DIAGNOSIS — N2581 Secondary hyperparathyroidism of renal origin: Secondary | ICD-10-CM | POA: Diagnosis not present

## 2017-05-25 DIAGNOSIS — D631 Anemia in chronic kidney disease: Secondary | ICD-10-CM | POA: Diagnosis not present

## 2017-05-25 DIAGNOSIS — Z992 Dependence on renal dialysis: Secondary | ICD-10-CM | POA: Diagnosis not present

## 2017-05-27 DIAGNOSIS — D631 Anemia in chronic kidney disease: Secondary | ICD-10-CM | POA: Diagnosis not present

## 2017-05-27 DIAGNOSIS — D509 Iron deficiency anemia, unspecified: Secondary | ICD-10-CM | POA: Diagnosis not present

## 2017-05-27 DIAGNOSIS — N186 End stage renal disease: Secondary | ICD-10-CM | POA: Diagnosis not present

## 2017-05-27 DIAGNOSIS — Z992 Dependence on renal dialysis: Secondary | ICD-10-CM | POA: Diagnosis not present

## 2017-05-27 DIAGNOSIS — N2581 Secondary hyperparathyroidism of renal origin: Secondary | ICD-10-CM | POA: Diagnosis not present

## 2017-05-28 IMAGING — US US ABDOMEN LIMITED
1 series · 14 of 25 positions shown · non-contrast
Comparison: CT scan of the abdomen dated December 08, 2015

CLINICAL DATA: Abdominal pain and nausea for the past 3 days,
distended gallbladder on abdominal CT scan today.

EXAM:
US ABDOMEN LIMITED - RIGHT UPPER QUADRANT

[Series 1: us abdomen limited · 0.27mm/px · 14 of 50 slices shown]
[im 1/50]
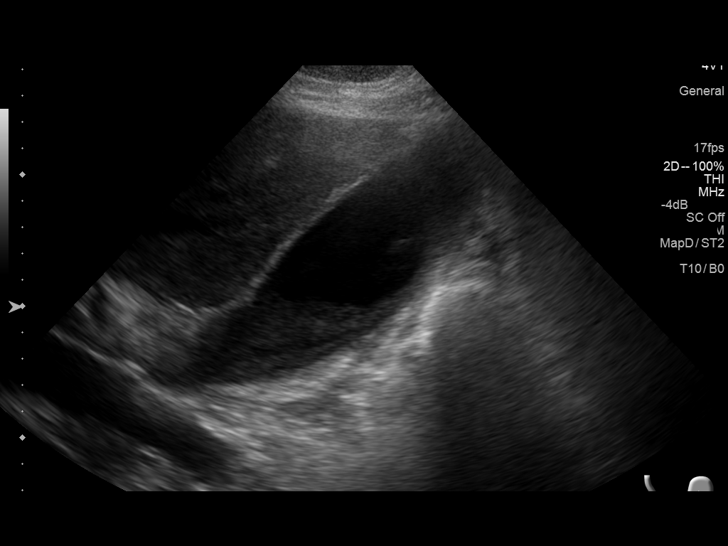
[im 5/50]
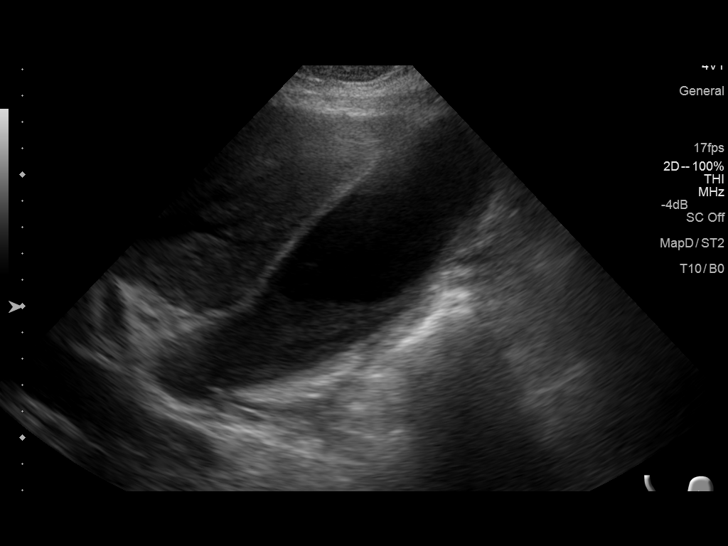
[im 9/50]
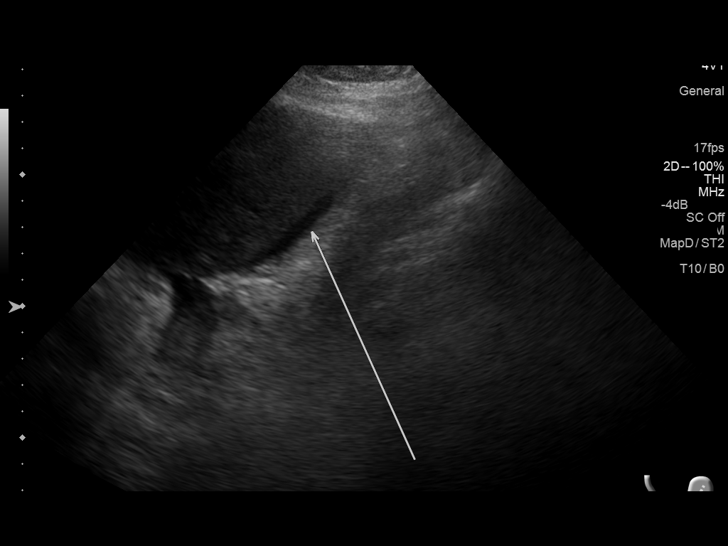
[im 13/50]
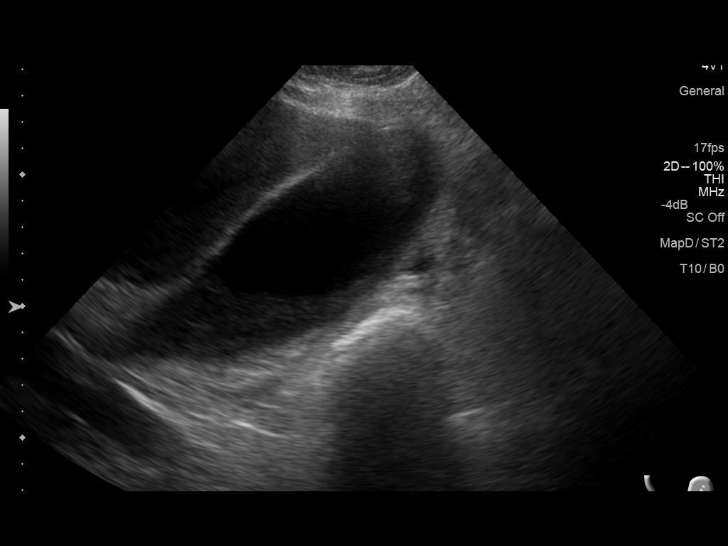
[im 17/50]
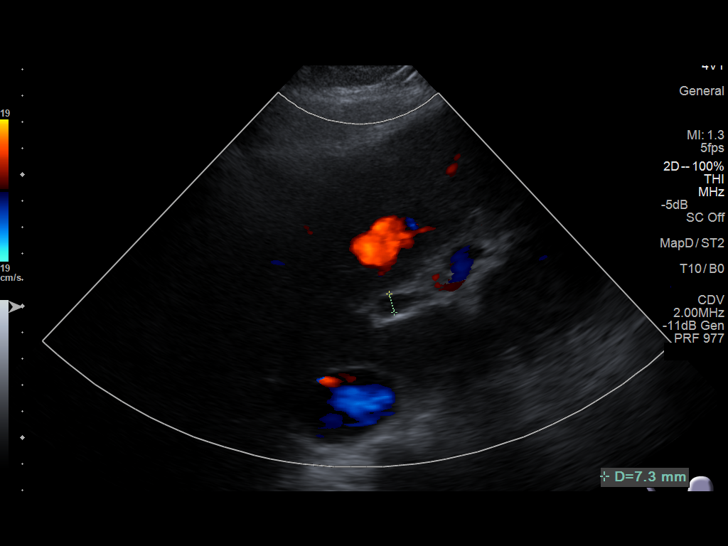
[im 19/50]
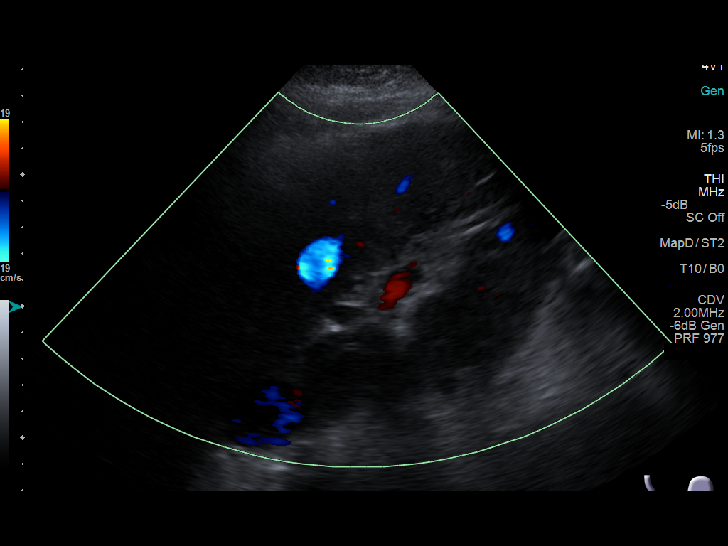
[im 23/50]
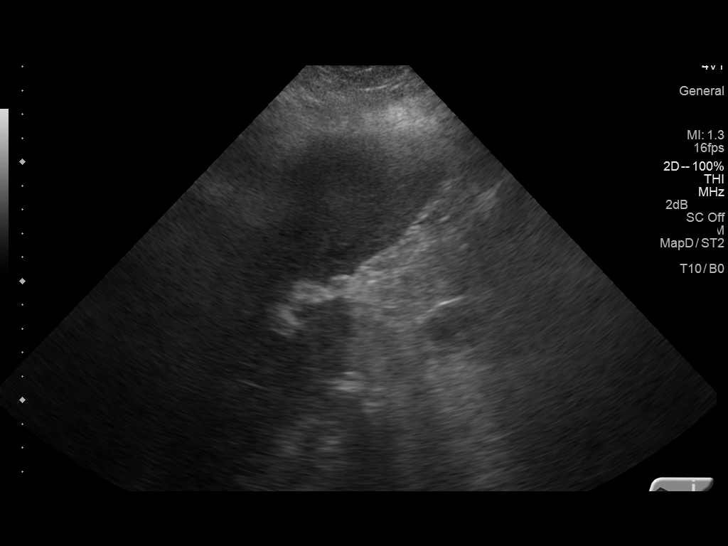
[im 27/50]
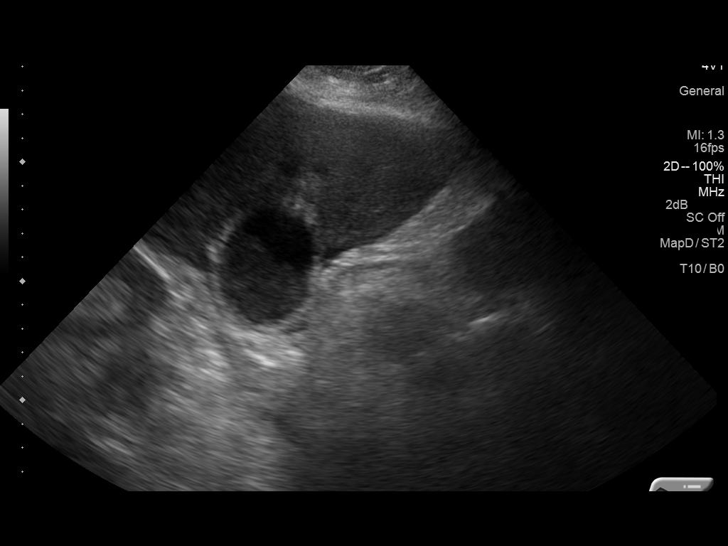
[im 31/50]
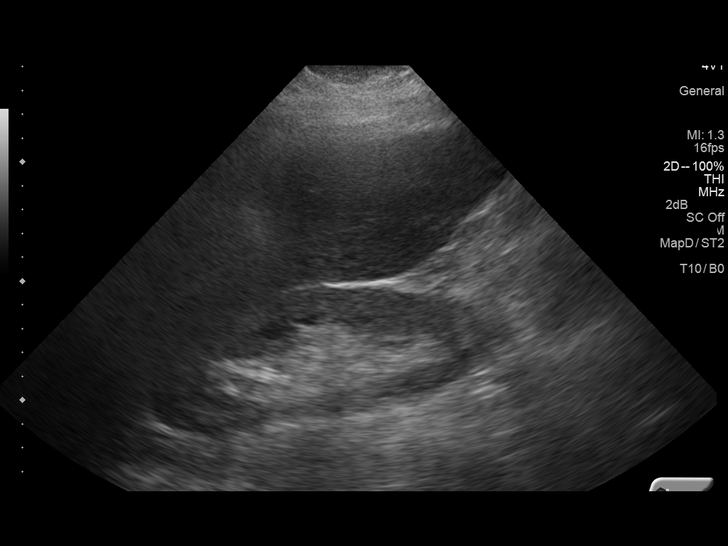
[im 33/50]
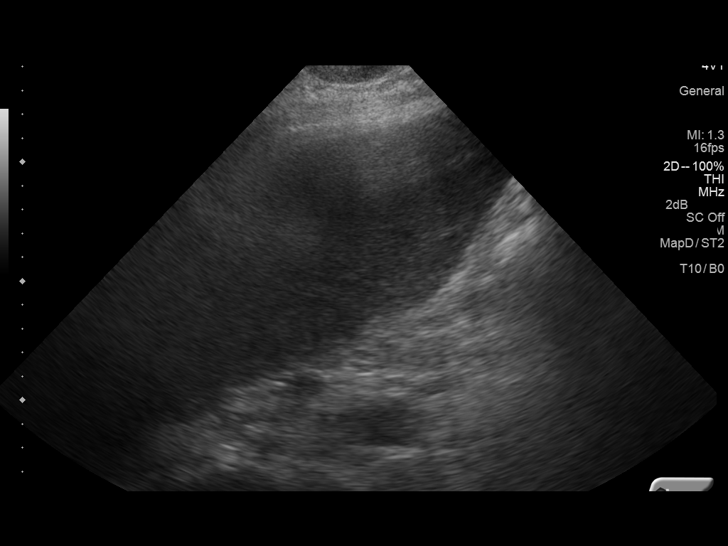
[im 37/50]
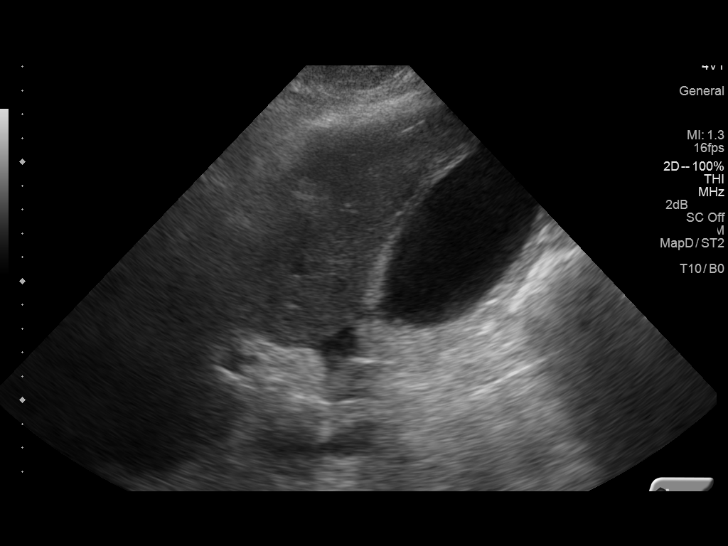
[im 41/50]
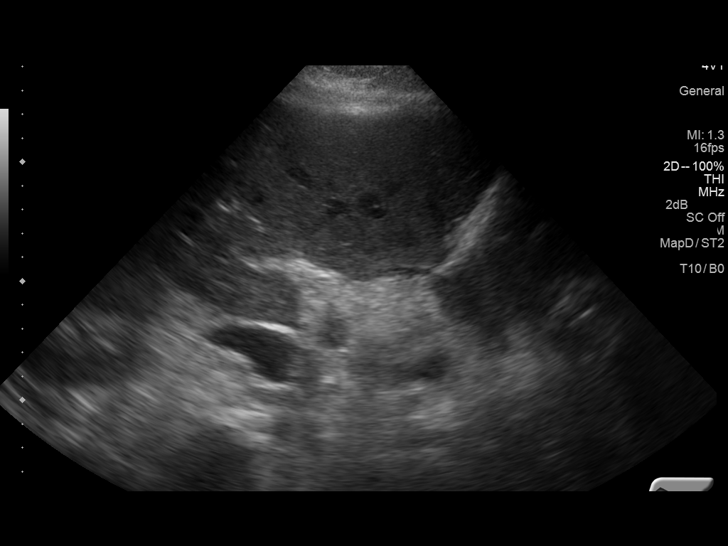
[im 45/50]
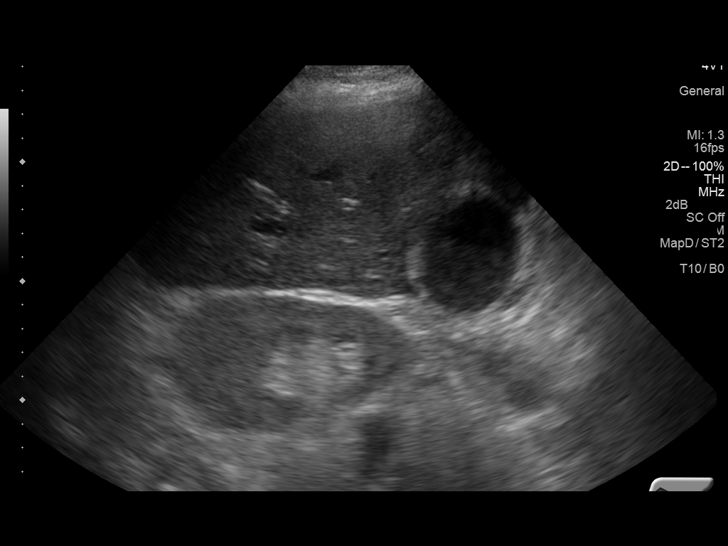
[im 50/50]
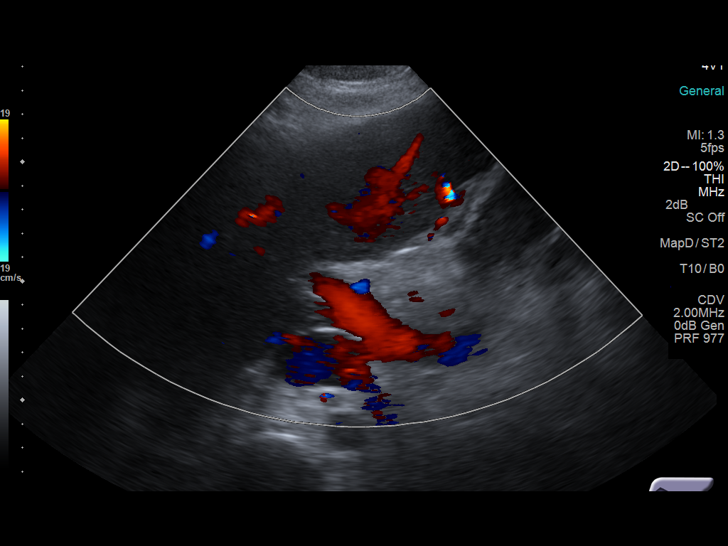

[14 of 25 positions shown; findings below may reference images not displayed]

FINDINGS: Gallbladder:

The gallbladder is mildly distended. There is wall thickening to
mm. Sludge is present but no stones are evident. There is a small
amount of pericholecystic fluid. There is no positive sonographic
Murphy's sign.

Common bile duct:

Diameter: 7.3-7.9 mm.  No intraluminal echoes are observed.

Liver:

The hepatic echotexture is mildly increased. There is no focal mass
or ductal dilation demonstrated on this ultrasound.
IMPRESSION: 1. Gallbladder distention, minimal wall thickening, sludge, and
pericholecystic fluid vs trace ascites. This could reflect acute or
chronic cholecystitis. The lack of a positive sonographic Murphy's
sign favors chronic cholecystitis.
2. Increased hepatic echotexture is consistent with fatty
infiltrative change.

## 2017-05-30 DIAGNOSIS — Z992 Dependence on renal dialysis: Secondary | ICD-10-CM | POA: Diagnosis not present

## 2017-05-30 DIAGNOSIS — N2581 Secondary hyperparathyroidism of renal origin: Secondary | ICD-10-CM | POA: Diagnosis not present

## 2017-05-30 DIAGNOSIS — N186 End stage renal disease: Secondary | ICD-10-CM | POA: Diagnosis not present

## 2017-05-30 DIAGNOSIS — D509 Iron deficiency anemia, unspecified: Secondary | ICD-10-CM | POA: Diagnosis not present

## 2017-05-30 DIAGNOSIS — D631 Anemia in chronic kidney disease: Secondary | ICD-10-CM | POA: Diagnosis not present

## 2017-06-01 DIAGNOSIS — N186 End stage renal disease: Secondary | ICD-10-CM | POA: Diagnosis not present

## 2017-06-01 DIAGNOSIS — N2581 Secondary hyperparathyroidism of renal origin: Secondary | ICD-10-CM | POA: Diagnosis not present

## 2017-06-01 DIAGNOSIS — D631 Anemia in chronic kidney disease: Secondary | ICD-10-CM | POA: Diagnosis not present

## 2017-06-01 DIAGNOSIS — Z992 Dependence on renal dialysis: Secondary | ICD-10-CM | POA: Diagnosis not present

## 2017-06-01 DIAGNOSIS — D509 Iron deficiency anemia, unspecified: Secondary | ICD-10-CM | POA: Diagnosis not present

## 2017-06-03 DIAGNOSIS — N2581 Secondary hyperparathyroidism of renal origin: Secondary | ICD-10-CM | POA: Diagnosis not present

## 2017-06-03 DIAGNOSIS — Z992 Dependence on renal dialysis: Secondary | ICD-10-CM | POA: Diagnosis not present

## 2017-06-03 DIAGNOSIS — N186 End stage renal disease: Secondary | ICD-10-CM | POA: Diagnosis not present

## 2017-06-03 DIAGNOSIS — D509 Iron deficiency anemia, unspecified: Secondary | ICD-10-CM | POA: Diagnosis not present

## 2017-06-03 DIAGNOSIS — D631 Anemia in chronic kidney disease: Secondary | ICD-10-CM | POA: Diagnosis not present

## 2017-06-06 DIAGNOSIS — N186 End stage renal disease: Secondary | ICD-10-CM | POA: Diagnosis not present

## 2017-06-06 DIAGNOSIS — D631 Anemia in chronic kidney disease: Secondary | ICD-10-CM | POA: Diagnosis not present

## 2017-06-06 DIAGNOSIS — D509 Iron deficiency anemia, unspecified: Secondary | ICD-10-CM | POA: Diagnosis not present

## 2017-06-06 DIAGNOSIS — Z992 Dependence on renal dialysis: Secondary | ICD-10-CM | POA: Diagnosis not present

## 2017-06-06 DIAGNOSIS — N2581 Secondary hyperparathyroidism of renal origin: Secondary | ICD-10-CM | POA: Diagnosis not present

## 2017-06-08 ENCOUNTER — Ambulatory Visit: Payer: Self-pay | Admitting: Surgery

## 2017-06-08 DIAGNOSIS — D509 Iron deficiency anemia, unspecified: Secondary | ICD-10-CM | POA: Diagnosis not present

## 2017-06-08 DIAGNOSIS — N186 End stage renal disease: Secondary | ICD-10-CM | POA: Diagnosis not present

## 2017-06-08 DIAGNOSIS — I890 Lymphedema, not elsewhere classified: Secondary | ICD-10-CM | POA: Diagnosis not present

## 2017-06-08 DIAGNOSIS — I50810 Right heart failure, unspecified: Secondary | ICD-10-CM | POA: Diagnosis not present

## 2017-06-08 DIAGNOSIS — G8929 Other chronic pain: Secondary | ICD-10-CM | POA: Diagnosis not present

## 2017-06-08 DIAGNOSIS — D631 Anemia in chronic kidney disease: Secondary | ICD-10-CM | POA: Diagnosis not present

## 2017-06-08 DIAGNOSIS — I10 Essential (primary) hypertension: Secondary | ICD-10-CM | POA: Diagnosis not present

## 2017-06-08 DIAGNOSIS — I272 Pulmonary hypertension, unspecified: Secondary | ICD-10-CM | POA: Diagnosis not present

## 2017-06-08 DIAGNOSIS — I2510 Atherosclerotic heart disease of native coronary artery without angina pectoris: Secondary | ICD-10-CM | POA: Diagnosis not present

## 2017-06-08 DIAGNOSIS — Z992 Dependence on renal dialysis: Secondary | ICD-10-CM | POA: Diagnosis not present

## 2017-06-08 DIAGNOSIS — N2581 Secondary hyperparathyroidism of renal origin: Secondary | ICD-10-CM | POA: Diagnosis not present

## 2017-06-08 DIAGNOSIS — E119 Type 2 diabetes mellitus without complications: Secondary | ICD-10-CM | POA: Diagnosis not present

## 2017-06-08 DIAGNOSIS — G4730 Sleep apnea, unspecified: Secondary | ICD-10-CM | POA: Diagnosis not present

## 2017-06-08 NOTE — H&P (Signed)
Surgical H&P  CC: ESRD  HPI: 51yo woman with multiple severe comorbidities including but not limited to CHF (right sided HF, pulmonary hypertension with peak pulm pressure 64mHg on last echo), CAD, diabetes, morbid obesity with lymphedema and hypoventilation, diabetes who desires peritoneal dialysis catheter insertion for home PD. She has been on dialysis for about a year and a half. Currently uses left forearm avf. She desires PD due to the convenience of doing this at home. She and her husband are both disabled and transportation is an issue with her. She also wants the time to visit her family instead of sitting in the dialysis unit. She has friends and acquaintances on PD and has heard nothing but good things about it. She has also done a good amount of research in addition to speaking with the dialysis nurse. Her only abdominal surgical history includes 2 C-sections and a hysterectomy all via Pfannenstiel. She does not use any blood thinners.  Allergies  Allergen Reactions  . Contrast Media [Iodinated Diagnostic Agents] Anaphylaxis, Hives, Swelling and Other (See Comments)    Dye for cardiac cath. Tongue swells  . Pneumococcal Vaccines Swelling    Turns skin black, and bodily swelling  . Shellfish Allergy Itching, Swelling and Other (See Comments)    Facial swelling - Pt able to eat now 11/25/16  . Vancomycin Nausea And Vomiting and Other (See Comments)    Infusion "made me feel like I was dying" had to be readmitted to hospital    Past Medical History:  Diagnosis Date  . Anxiety   . Asthma    as a child  . [place] ([place]    thyroid  . Cellulitis   . CH[place] ([place]estive heart failure) ([place]   . Chiari malformation    s/p surgery  . Chiari malformation   . Chronic pain   . Complication of anesthesia 11/28/15   Resp arrest after  conscious  sedation  . Constipation   . Coronary artery disease    40-50% mid LAD 04/29/09, Medical tx. (Dr. BGwenlyn Found  . Diabetes mellitus    Type II  .  Fibromyalgia   . History of blood transfusion    hemorrage duinrg pregancy  . Hx of echocardiogram 10/2011   EF 55-60%  . Hypercholesterolemia   . Hypertension   . Lymph edema   . Ob[place]ilation syndrome ([place]   . On home O2   . Pneumonia    in past  . Renal disorder    Pt started dialysis in Dec.2016  . S/P colonoscopy 05/26/2007   Dr. RLaural Goldensigmoid diverticulosis random biopsies benign  . S/P endoscopy 05/01/2009   Dr. GPenelope Cooppill-induced esophageal ulcerations distal to midesophagus, 2 small ulcers in the antrum of the stomach  . Shortness of breath dyspnea    with any exertion or if heart rate  is irregular while on dialysis  . Sleep apnea     Past Surgical History:  Procedure Laterality Date  . .Marland Kitchenemodialysis catheter Right 11/28/2015  . ABDOMINAL HYSTERECTOMY    . ADENOIDECTOMY    . AV FISTULA PLACEMENT Left 03/15/2016   Procedure: CREATION OF LEFT ARM ARTERIOVENOUS (AV) FISTULA  ;  Surgeon: TRosetta Posner MD;  [facility] [facility]  Service: Vascular;  Laterality: Left;  . CESAREAN SECTION      x 2  . CRANIECTOMY SUBOCCIPITAL W/ CERVICAL LAMINECTOMY / CHIARI    . FISTULA SUPERFICIALIZATION Left 05/10/2016   Procedure: Left Arm FISTULA SUPERFICIALIZATION;  Surgeon: TRosetta Posner MD;  [facility];  Service: Vascular;  Laterality: Left;  . IR GENERIC HISTORICAL  08/14/2016   IR REMOVAL TUN ACCESS W/ PORT W/O FL MOD SED 08/14/2016 Arne Cleveland, MD [facility]-INTERV RAD  . PERIPHERAL VASCULAR CATHETERIZATION Left 11/25/2016   Procedure: A/V Fistulagram;  Surgeon: Conrad Medora, MD;  Location: [place] [facility];  Service: Cardiovascular;  Laterality: Left;  arm  . PORTACATH PLACEMENT  07/05/2012   Procedure: INSERTION PORT-A-CATH;  Surgeon: Donato Heinz, MD;  Location: [facility];  Service: General;  Laterality: Left;  subclavian  . THYROIDECTOMY, PARTIAL    . TONSILLECTOMY      Family History  Problem Relation Age of Onset  . Colon cancer Mother 54  . Stroke Mother 25  .  Coronary artery disease Mother     Social History   Social History  . Marital status: Married    Spouse name: N/A  . Number of children: 3  . Years of education: N/A   Social History Main Topics  . Smoking status: Never Smoker  . Smokeless tobacco: Never Used  . Alcohol use No  . Drug use: No  . Sexual activity: No   Other Topics Concern  . 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 acetaminophen (TYLENOL) 325 MG tablet Take 650 mg by mouth every 6 (six) hours as needed for moderate pain or fever.     Marland Kitchen albuterol (PROVENTIL) (2.5 MG/3ML) 0.083% nebulizer solution Take 2.5 mg by nebulization every 6 (six) hours as needed for wheezing or shortness of breath.    . ALPRAZolam (XANAX) 0.5 MG tablet Take 0.5 mg by mouth 3 (three) times daily as needed for anxiety or sleep.     Marland Kitchen amLODipine (NORVASC) 10 MG tablet Take 0.5 tablets (5 mg total) by mouth daily. 15 tablet 12  . cinacalcet (SENSIPAR) 30 MG tablet Take 30 mg by mouth daily.    . diphenhydrAMINE (BENADRYL) 25 mg capsule Take 25 mg by mouth every 6 (six) hours as needed for allergies.    Marland Kitchen epoetin alfa (EPOGEN,PROCRIT) 4000 UNIT/ML injection Inject 2 mLs (8,000 Units total) into the vein every Monday, Wednesday, and Friday with hemodialysis. 1 mL   . insulin aspart (NOVOLOG) 100 UNIT/ML injection Inject 0-9 Units into the skin 3 (three) times daily with meals. 10 mL 11  . Insulin Glargine (LANTUS SOLOSTAR) 100 UNIT/ML Solostar Pen Inject 10 Units into the skin daily at 10 pm. 15 mL 11  . lidocaine, PF, (XYLOCAINE) 1 % SOLN injection Inject 5 mLs into the skin as needed (topical anesthesia for hemodialysis ifGEBAUERS is ineffective.).  0  . lidocaine-prilocaine (EMLA) cream Apply 1 application topically as needed (topical anesthesia for hemodialysis if Gebauers and Lidocaine injection are ineffective.). 30  g 0  . metoCLOPramide (REGLAN) 5 MG tablet Take 1 tablet (5 mg total) by mouth 4 (four) times daily -  before meals and at bedtime.    . metoprolol (LOPRESSOR) 50 MG tablet Take 0.5 tablets (25 mg total) by mouth 2 (two) times daily. 30 tablet 12  . multivitamin (RENA-VIT) TABS tablet Take 1 tablet by mouth daily.    . ondansetron (ZOFRAN) 4 MG tablet Take 1 tablet (4 mg total) by mouth every 6 (six) hours as needed for nausea. 20 tablet 0  . Oxycodone HCl 10 MG TABS Take 1 tablet (10 mg  total) by mouth 4 (four) times daily as needed (pain).    . pentafluoroprop-tetrafluoroeth (GEBAUERS) AERO Apply 1 application topically as needed (topical anesthesia for hemodialysis).  0  . sevelamer carbonate (RENVELA) 800 MG tablet Take 1,600-3,200 mg by mouth See admin instructions. Take 3200 mg by mouth 3 times daily with meals and take 1600 mg by mouth with snacks.    . [DISCONTINUED] insulin lispro (HUMALOG) 100 UNIT/ML injection Inject 60 Units into the skin 2 (two) times daily.       No current facility-administered medications on file prior to visit.     Physical Exam: There were no vitals filed for this visit. Gen: A&Ox3, no distress. Morbidly obese woman in a wheelchair Head: normocephalic, atraumatic, EOMI, anicteric.  Neck: supple without mass or abnormality Chest: unlabored respirations. Currently on room air.  Cardiovascular: RRR. Thrill in left forearm AV fistula Abdomen: obese, nontender nondistended.  No palpable abnormalities Extremities: bilateral lower extremity lymphedema Neuro: grossly intact Psych: appropriate mood and affect Skin: no lesions or rashes on limited exam  CBC Latest Ref Rng & Units 02/01/2017 01/31/2017 01/30/2017  WBC 4.0 - 10.5 K/uL 8.8 12.3(H) 14.3(H)  Hemoglobin 12.0 - 15.0 g/dL 9.1(L) 9.1(L) 9.1(L)  Hematocrit 36.0 - 46.0 % 27.0(L) 27.0(L) 27.0(L)  Platelets 150 - 400 K/uL 202 202 190    CMP Latest Ref Rng & Units 01/31/2017 01/29/2017 01/28/2017  Glucose 65 -  99 mg/dL 173(H) 159(H) 229(H)  BUN 6 - 20 mg/dL 48(H) 36(H) 59(H)  Creatinine 0.44 - 1.00 mg/dL 5.55(H) 3.96(H) 5.25(H)  Sodium 135 - 145 mmol/L 132(L) 135 132(L)  Potassium 3.5 - 5.1 mmol/L 3.4(L) 3.4(L) 3.5  Chloride 101 - 111 mmol/L 94(L) 96(L) 95(L)  CO2 22 - 32 mmol/L _0 Calcium 8.9 - 10.3 mg/dL 8.0(L) 8.5(L) 7.8(L)  Total Protein 6.5 - 8.1 g/dL - 6.5 -  Total Bilirubin 0.3 - 1.2 mg/dL - 0.9 -  Alkaline Phos 38 - 126 U/L - 170(H) -  AST 15 - 41 U/L - 26 -  ALT 14 - 54 U/L - 286(H) -    Lab Results  Component Value Date   INR 1.68 02/01/2017   INR 1.98 01/31/2017   INR 2.19 01/30/2017    Imaging: n/a  A/P: 51 year old woman with multiple severe comorbidities, desires peritoneal dialysis catheter. I discussed with her the nature of the surgery including the necessity of general anesthesia and abdominal insufflation.  We discussed the risks of surgery including bleeding, infection, pain, scarring, intra-abdominal injury; as well as complications of the peritoneal dialysis catheter including occlusion, need for repositioning or removal etc.  I emphasized to her that she is at high risk for general anesthesia because of her comorbidities specifically her pulmonary hypertension and that I would like her to get cardiac clearance prior to proceeding with general anesthesia.  She expressed understanding and all her questions were answered.  She is eager to move forward.  We will refer her to see a cardiologist to assess her safety for general anesthesia prior to scheduling.   Romana Juniper, MD [facility] Surgery, [place] Pager [phone]

## 2017-06-11 DIAGNOSIS — D509 Iron deficiency anemia, unspecified: Secondary | ICD-10-CM | POA: Diagnosis not present

## 2017-06-11 DIAGNOSIS — N186 End stage renal disease: Secondary | ICD-10-CM | POA: Diagnosis not present

## 2017-06-11 DIAGNOSIS — N2581 Secondary hyperparathyroidism of renal origin: Secondary | ICD-10-CM | POA: Diagnosis not present

## 2017-06-11 DIAGNOSIS — D631 Anemia in chronic kidney disease: Secondary | ICD-10-CM | POA: Diagnosis not present

## 2017-06-11 DIAGNOSIS — Z992 Dependence on renal dialysis: Secondary | ICD-10-CM | POA: Diagnosis not present

## 2017-06-13 DIAGNOSIS — N2581 Secondary hyperparathyroidism of renal origin: Secondary | ICD-10-CM | POA: Diagnosis not present

## 2017-06-13 DIAGNOSIS — Z992 Dependence on renal dialysis: Secondary | ICD-10-CM | POA: Diagnosis not present

## 2017-06-13 DIAGNOSIS — D509 Iron deficiency anemia, unspecified: Secondary | ICD-10-CM | POA: Diagnosis not present

## 2017-06-13 DIAGNOSIS — D631 Anemia in chronic kidney disease: Secondary | ICD-10-CM | POA: Diagnosis not present

## 2017-06-13 DIAGNOSIS — N186 End stage renal disease: Secondary | ICD-10-CM | POA: Diagnosis not present

## 2017-06-15 DIAGNOSIS — D631 Anemia in chronic kidney disease: Secondary | ICD-10-CM | POA: Diagnosis not present

## 2017-06-15 DIAGNOSIS — D509 Iron deficiency anemia, unspecified: Secondary | ICD-10-CM | POA: Diagnosis not present

## 2017-06-15 DIAGNOSIS — Z992 Dependence on renal dialysis: Secondary | ICD-10-CM | POA: Diagnosis not present

## 2017-06-15 DIAGNOSIS — N2581 Secondary hyperparathyroidism of renal origin: Secondary | ICD-10-CM | POA: Diagnosis not present

## 2017-06-15 DIAGNOSIS — N186 End stage renal disease: Secondary | ICD-10-CM | POA: Diagnosis not present

## 2017-06-17 DIAGNOSIS — N2581 Secondary hyperparathyroidism of renal origin: Secondary | ICD-10-CM | POA: Diagnosis not present

## 2017-06-17 DIAGNOSIS — D631 Anemia in chronic kidney disease: Secondary | ICD-10-CM | POA: Diagnosis not present

## 2017-06-17 DIAGNOSIS — D509 Iron deficiency anemia, unspecified: Secondary | ICD-10-CM | POA: Diagnosis not present

## 2017-06-17 DIAGNOSIS — Z992 Dependence on renal dialysis: Secondary | ICD-10-CM | POA: Diagnosis not present

## 2017-06-17 DIAGNOSIS — N186 End stage renal disease: Secondary | ICD-10-CM | POA: Diagnosis not present

## 2017-06-18 DIAGNOSIS — N186 End stage renal disease: Secondary | ICD-10-CM | POA: Diagnosis not present

## 2017-06-18 DIAGNOSIS — Z992 Dependence on renal dialysis: Secondary | ICD-10-CM | POA: Diagnosis not present

## 2017-06-20 DIAGNOSIS — N186 End stage renal disease: Secondary | ICD-10-CM | POA: Diagnosis not present

## 2017-06-20 DIAGNOSIS — D631 Anemia in chronic kidney disease: Secondary | ICD-10-CM | POA: Diagnosis not present

## 2017-06-20 DIAGNOSIS — Z992 Dependence on renal dialysis: Secondary | ICD-10-CM | POA: Diagnosis not present

## 2017-06-20 DIAGNOSIS — N2581 Secondary hyperparathyroidism of renal origin: Secondary | ICD-10-CM | POA: Diagnosis not present

## 2017-06-20 DIAGNOSIS — D509 Iron deficiency anemia, unspecified: Secondary | ICD-10-CM | POA: Diagnosis not present

## 2017-06-22 DIAGNOSIS — N2581 Secondary hyperparathyroidism of renal origin: Secondary | ICD-10-CM | POA: Diagnosis not present

## 2017-06-22 DIAGNOSIS — D631 Anemia in chronic kidney disease: Secondary | ICD-10-CM | POA: Diagnosis not present

## 2017-06-22 DIAGNOSIS — Z992 Dependence on renal dialysis: Secondary | ICD-10-CM | POA: Diagnosis not present

## 2017-06-22 DIAGNOSIS — D509 Iron deficiency anemia, unspecified: Secondary | ICD-10-CM | POA: Diagnosis not present

## 2017-06-22 DIAGNOSIS — N186 End stage renal disease: Secondary | ICD-10-CM | POA: Diagnosis not present

## 2017-06-24 DIAGNOSIS — N2581 Secondary hyperparathyroidism of renal origin: Secondary | ICD-10-CM | POA: Diagnosis not present

## 2017-06-24 DIAGNOSIS — Z992 Dependence on renal dialysis: Secondary | ICD-10-CM | POA: Diagnosis not present

## 2017-06-24 DIAGNOSIS — D631 Anemia in chronic kidney disease: Secondary | ICD-10-CM | POA: Diagnosis not present

## 2017-06-24 DIAGNOSIS — D509 Iron deficiency anemia, unspecified: Secondary | ICD-10-CM | POA: Diagnosis not present

## 2017-06-24 DIAGNOSIS — N186 End stage renal disease: Secondary | ICD-10-CM | POA: Diagnosis not present

## 2017-06-27 DIAGNOSIS — Z992 Dependence on renal dialysis: Secondary | ICD-10-CM | POA: Diagnosis not present

## 2017-06-27 DIAGNOSIS — D631 Anemia in chronic kidney disease: Secondary | ICD-10-CM | POA: Diagnosis not present

## 2017-06-27 DIAGNOSIS — N186 End stage renal disease: Secondary | ICD-10-CM | POA: Diagnosis not present

## 2017-06-27 DIAGNOSIS — D509 Iron deficiency anemia, unspecified: Secondary | ICD-10-CM | POA: Diagnosis not present

## 2017-06-27 DIAGNOSIS — N2581 Secondary hyperparathyroidism of renal origin: Secondary | ICD-10-CM | POA: Diagnosis not present

## 2017-06-29 DIAGNOSIS — N2581 Secondary hyperparathyroidism of renal origin: Secondary | ICD-10-CM | POA: Diagnosis not present

## 2017-06-29 DIAGNOSIS — N186 End stage renal disease: Secondary | ICD-10-CM | POA: Diagnosis not present

## 2017-06-29 DIAGNOSIS — Z992 Dependence on renal dialysis: Secondary | ICD-10-CM | POA: Diagnosis not present

## 2017-06-29 DIAGNOSIS — D631 Anemia in chronic kidney disease: Secondary | ICD-10-CM | POA: Diagnosis not present

## 2017-06-29 DIAGNOSIS — D509 Iron deficiency anemia, unspecified: Secondary | ICD-10-CM | POA: Diagnosis not present

## 2017-07-01 DIAGNOSIS — Z992 Dependence on renal dialysis: Secondary | ICD-10-CM | POA: Diagnosis not present

## 2017-07-01 DIAGNOSIS — D631 Anemia in chronic kidney disease: Secondary | ICD-10-CM | POA: Diagnosis not present

## 2017-07-01 DIAGNOSIS — N186 End stage renal disease: Secondary | ICD-10-CM | POA: Diagnosis not present

## 2017-07-01 DIAGNOSIS — D509 Iron deficiency anemia, unspecified: Secondary | ICD-10-CM | POA: Diagnosis not present

## 2017-07-01 DIAGNOSIS — N2581 Secondary hyperparathyroidism of renal origin: Secondary | ICD-10-CM | POA: Diagnosis not present

## 2017-07-04 DIAGNOSIS — D631 Anemia in chronic kidney disease: Secondary | ICD-10-CM | POA: Diagnosis not present

## 2017-07-04 DIAGNOSIS — N186 End stage renal disease: Secondary | ICD-10-CM | POA: Diagnosis not present

## 2017-07-04 DIAGNOSIS — Z992 Dependence on renal dialysis: Secondary | ICD-10-CM | POA: Diagnosis not present

## 2017-07-04 DIAGNOSIS — N2581 Secondary hyperparathyroidism of renal origin: Secondary | ICD-10-CM | POA: Diagnosis not present

## 2017-07-04 DIAGNOSIS — D509 Iron deficiency anemia, unspecified: Secondary | ICD-10-CM | POA: Diagnosis not present

## 2017-07-06 DIAGNOSIS — N186 End stage renal disease: Secondary | ICD-10-CM | POA: Diagnosis not present

## 2017-07-06 DIAGNOSIS — N2581 Secondary hyperparathyroidism of renal origin: Secondary | ICD-10-CM | POA: Diagnosis not present

## 2017-07-06 DIAGNOSIS — D631 Anemia in chronic kidney disease: Secondary | ICD-10-CM | POA: Diagnosis not present

## 2017-07-06 DIAGNOSIS — D509 Iron deficiency anemia, unspecified: Secondary | ICD-10-CM | POA: Diagnosis not present

## 2017-07-06 DIAGNOSIS — Z992 Dependence on renal dialysis: Secondary | ICD-10-CM | POA: Diagnosis not present

## 2017-07-08 ENCOUNTER — Ambulatory Visit (INDEPENDENT_AMBULATORY_CARE_PROVIDER_SITE_OTHER): Payer: Medicare Other | Admitting: Internal Medicine

## 2017-07-08 ENCOUNTER — Encounter: Payer: Self-pay | Admitting: Internal Medicine

## 2017-07-08 VITALS — BP 139/84 | HR 88 | Ht 65.0 in | Wt 307.8 lb

## 2017-07-08 DIAGNOSIS — N186 End stage renal disease: Secondary | ICD-10-CM | POA: Diagnosis not present

## 2017-07-08 DIAGNOSIS — I272 Pulmonary hypertension, unspecified: Secondary | ICD-10-CM | POA: Diagnosis not present

## 2017-07-08 DIAGNOSIS — Z01810 Encounter for preprocedural cardiovascular examination: Secondary | ICD-10-CM | POA: Diagnosis not present

## 2017-07-08 DIAGNOSIS — I10 Essential (primary) hypertension: Secondary | ICD-10-CM

## 2017-07-08 NOTE — Patient Instructions (Signed)
Your physician recommends that you schedule a follow-up appointment in:  2 months with Dr Harl Bowie    Your physician has requested that you have an echocardiogram. Echocardiography is a painless test that uses sound waves to create images of your heart. It provides your doctor with information about the size and shape of your heart and how well your heart's chambers and valves are working. This procedure takes approximately one hour. There are no restrictions for this procedure.     Your physician recommends that you continue on your current medications as directed. Please refer to the Current Medication list given to you today.    If you need a refill on your cardiac medications before your next appointment, please call your pharmacy.     No lab work ordered today.       Thank you for choosing [facility] !

## 2017-07-08 NOTE — Progress Notes (Addendum)
Cardiology Office Note   Date:  07/08/2017   ID:  [name], DOB August 15, 1966, MRN [id number]  PCP:  [name], MD  Cardiologist:   Dorris Carnes, MD   Pt referred by Dr Kae Heller for preop evaluation      History of Present Illness: [name] is a 51 y.o. female with a history of cellulitis, ESRD, DM, morbid obesity  And CHF  Echo in Feb LVEF 65 to 75%  PAP 85 mm Hg RVEF mod reduced   I remember the patient from seeing her at that time  She was admitted to the ICU with cellulitis and sepsis  On pressores at the time  Echo done as noted above  Since d/c she has followed with [facility]  CT scan without evid of chronic PE  She has been taken off of anticoagulation    She says she is feeling good  Off O2  Not needing CPAP  No CP  Breathing good   Does hemodialysis 3x per week over 4.5 hours  GOing OK   Wants to move to peritoneal for convenience  (rides)  No dizziness  No syncope        Current Meds  Medication Sig  . acetaminophen (TYLENOL) 325 MG tablet Take 650 mg by mouth every 6 (six) hours as needed for moderate pain or fever.   Marland Kitchen albuterol (PROVENTIL) (2.5 MG/3ML) 0.083% nebulizer solution Take 2.5 mg by nebulization every 6 (six) hours as needed for wheezing or shortness of breath.  . ALPRAZolam (XANAX) 0.5 MG tablet Take 0.5 mg by mouth 3 (three) times daily as needed for anxiety or sleep.   Marland Kitchen amLODipine (NORVASC) 10 MG tablet Take 0.5 tablets (5 mg total) by mouth daily.  . diphenhydrAMINE (BENADRYL) 25 mg capsule Take 25 mg by mouth every 6 (six) hours as needed for allergies.  Marland Kitchen epoetin alfa (EPOGEN,PROCRIT) 4000 UNIT/ML injection Inject 2 mLs (8,000 Units total) into the vein every Monday, Wednesday, and Friday with hemodialysis.  Marland Kitchen metoprolol (LOPRESSOR) 50 MG tablet Take 0.5 tablets (25 mg total) by mouth 2 (two) times daily.  . ondansetron (ZOFRAN) 4 MG tablet Take 1 tablet (4 mg total) by mouth every 6 (six) hours as needed for nausea.  . Oxycodone HCl  10 MG TABS Take 1 tablet (10 mg total) by mouth 4 (four) times daily as needed (pain).  Marland Kitchen sevelamer carbonate (RENVELA) 800 MG tablet Take 1,600-3,200 mg by mouth See admin instructions. Take 3200 mg by mouth 3 times daily with meals and take 1600 mg by mouth with snacks.     Allergies:   Contrast media [iodinated diagnostic agents]; Pneumococcal vaccines; Shellfish allergy; and Vancomycin   Past Medical History:  Diagnosis Date  . Anxiety   . Asthma    as a child  . Cancer ([place])    thyroid  . Cellulitis   . CHF (congestive heart failure) ([place])   . Chiari malformation    s/p surgery  . Chiari malformation   . Chronic pain   . Complication of anesthesia 11/28/15   Resp arrest after  conscious  sedation  . Constipation   . Coronary artery disease    40-50% mid LAD 04/29/09, Medical tx. (Dr. Gwenlyn Found)  . Diabetes mellitus    Type II  . Fibromyalgia   . History of blood transfusion    hemorrage duinrg pregancy  . Hx of echocardiogram 10/2011   EF 55-60%  . Hypercholesterolemia   . Hypertension   .  Lymph edema   . Obesity hypoventilation syndrome ([place])   . On home O2   . Pneumonia    in past  . Renal disorder    Pt started dialysis in Dec.2016  . S/P colonoscopy 05/26/2007   Dr. Laural Golden sigmoid diverticulosis random biopsies benign  . S/P endoscopy 05/01/2009   Dr. Penelope Coop pill-induced esophageal ulcerations distal to midesophagus, 2 small ulcers in the antrum of the stomach  . Shortness of breath dyspnea    with any exertion or if heart rate  is irregular while on dialysis  . Sleep apnea     Past Surgical History:  Procedure Laterality Date  . Marland KitchenHemodialysis catheter Right 11/28/2015  . ABDOMINAL HYSTERECTOMY    . ADENOIDECTOMY    . AV FISTULA PLACEMENT Left 03/15/2016   Procedure: CREATION OF LEFT ARM ARTERIOVENOUS (AV) FISTULA  ;  Surgeon: Rosetta Posner, MD;  Location: [facility];  Service: Vascular;  Laterality: Left;  . CESAREAN SECTION      x 2  . CRANIECTOMY SUBOCCIPITAL  W/ CERVICAL LAMINECTOMY / CHIARI    . FISTULA SUPERFICIALIZATION Left 05/10/2016   Procedure: Left Arm FISTULA SUPERFICIALIZATION;  Surgeon: Rosetta Posner, MD;  [facility];  Service: Vascular;  Laterality: Left;  . IR GENERIC HISTORICAL  08/14/2016   IR REMOVAL TUN ACCESS W/ PORT W/O FL MOD SED 08/14/2016 Arne Cleveland, MD [facility]-INTERV RAD  . PERIPHERAL VASCULAR CATHETERIZATION Left 11/25/2016   Procedure: A/V Fistulagram;  Surgeon: Conrad [name], MD;  Location: [facility] LAB;  Service: Cardiovascular;  Laterality: Left;  arm  . PORTACATH PLACEMENT  07/05/2012   Procedure: INSERTION PORT-A-CATH;  Surgeon: Donato Heinz, MD;  Location: [facility];  Service: General;  Laterality: Left;  subclavian  . THYROIDECTOMY, PARTIAL    . TONSILLECTOMY       Social History:  The patient  reports that she has never smoked. She has never used smokeless tobacco. She reports that she does not drink alcohol or use drugs.   Family History:  The patient's family history includes Colon cancer (age of onset: 59) in her mother; Coronary artery disease in her mother; Stroke (age of onset: 41) in her mother.    ROS:  Please see the history of present illness. All other systems are reviewed and  Negative to the above problem except as noted.    PHYSICAL EXAM: VS:  BP 139/84   Pulse 88   Ht _0  (1.651 m)   Wt (!) 307 lb 12.8 oz (139.6 kg)   SpO2 96% Comment: on room air  BMI 51.22 kg/m   GEN: Morbidly obese 51 yo , in no acute distress  HEENT: normal  Neck: no JVD, carotid bruits, or masses Cardiac: RRR; no murmurs, rubs, or gallops,Tr edema  Respiratory:  clear to auscultation bilaterally, normal work of breathing GI: soft, nontender, obese  , + BS  No hepatomegaly  MS: no deformity Moving all extremities   Skin: warm and dry, no rash Neuro:  Strength and sensation are intact Psych: euthymic mood, full affect   EKG:  EKG is not  ordered today.  From 01/20/17  SR 96 bpm  Poss IWMI  Prolonged QT   Nonspecific ST changes     Lipid Panel    Component Value Date/Time   CHOL  04/29/2009 0630    167        ATP III CLASSIFICATION:  <200     mg/dL   Desirable  200-239  mg/dL   Borderline High  >=240    mg/dL   High          TRIG 220 (H) 04/29/2009 0630   HDL 37 (L) 04/29/2009 0630   CHOLHDL 4.5 04/29/2009 0630   VLDL 44 (H) 04/29/2009 0630   LDLCALC  04/29/2009 0630    86        Total Cholesterol/HDL:CHD Risk Coronary Heart Disease Risk Table                     Men   Women  1/2 Average Risk   3.4   3.3  Average Risk       5.0   4.4  2 X Average Risk   9.6   7.1  3 X Average Risk  23.4   11.0        Use the calculated Patient Ratio above and the CHD Risk Table to determine the patient's CHD Risk.        ATP III CLASSIFICATION (LDL):  <100     mg/dL   Optimal  100-129  mg/dL   Near or Above                    Optimal  130-159  mg/dL   Borderline  160-189  mg/dL   High  >190     mg/dL   Very High      Wt Readings from Last 3 Encounters:  07/08/17 (!) 307 lb 12.8 oz (139.6 kg)  02/01/17 (!) 327 lb 6.1 oz (148.5 kg)  01/06/17 (!) 304 lb 3.8 oz (138 kg)      ASSESSMENT AND PLAN:  Renea Ee is a 51 yo with history of severe pulmonary HTN and RV dysfunction by echo thsi winter.  LV function is normal The patient is feeling much better than when I saw her last winter.  Off O2  Continues hemodialysis which she has tolerated O2   Being eval for placement of catheter so she can start peritoneal dialysis  To further stratify for surgical risks I would recomm repeat echo to redefine RV function and pulmnary pressures Volume status at present appears OK    2  HTN  BP is OK   Contine meds  3 ESRD  Dialysis MWF.  Further f/u based on test results     Current medicines are reviewed at length with the patient today.  The patient does not have concerns regarding medicines.  Signed, Dorris Carnes, MD  07/08/2017 8:34 AM    [address],  [place], [place]  [address] Phone: [phone]; Fax: [phone]   Addendum:  08/10/17  Pt underwent R heart catheterization on 8/6/ 18  Findings: RA = 11 RV = 76/13 PA = 76/24 (45) PCW = 19 Fick cardiac output/index = 8.7/3.7 PVR = 3.0 WU Ao sat = 99% PA sat = 75%, 75% High SVC sat = 71%  Assessment: 1. Moderate PAH with high cardiac output 2. Relative well compensated left-sided pressures in setting of HD  Plan/Discussion:  PAH likely multifactorial given high cardiac output and relatively normal PVR suspect majority of PAH may be due to presence of AV fistula. There is no evidence of intra-cardiac shunting.   No role for selective pulmonary vasodilators at this time  I have reviewed findings with Drs. Dorris Fetch and Hawkins/ Pt is acceptable to proceed with placement of peritoneal dialysis catheter with close f/u by anesthesia throughout, with invasive  monitoring   She will do better in long run with peritoneal dialysis from hemodynamic standpoint.  Dorris Carnes

## 2017-07-09 DIAGNOSIS — D631 Anemia in chronic kidney disease: Secondary | ICD-10-CM | POA: Diagnosis not present

## 2017-07-09 DIAGNOSIS — N2581 Secondary hyperparathyroidism of renal origin: Secondary | ICD-10-CM | POA: Diagnosis not present

## 2017-07-09 DIAGNOSIS — Z992 Dependence on renal dialysis: Secondary | ICD-10-CM | POA: Diagnosis not present

## 2017-07-09 DIAGNOSIS — D509 Iron deficiency anemia, unspecified: Secondary | ICD-10-CM | POA: Diagnosis not present

## 2017-07-09 DIAGNOSIS — N186 End stage renal disease: Secondary | ICD-10-CM | POA: Diagnosis not present

## 2017-07-11 DIAGNOSIS — Z992 Dependence on renal dialysis: Secondary | ICD-10-CM | POA: Diagnosis not present

## 2017-07-11 DIAGNOSIS — N186 End stage renal disease: Secondary | ICD-10-CM | POA: Diagnosis not present

## 2017-07-11 DIAGNOSIS — D509 Iron deficiency anemia, unspecified: Secondary | ICD-10-CM | POA: Diagnosis not present

## 2017-07-11 DIAGNOSIS — D631 Anemia in chronic kidney disease: Secondary | ICD-10-CM | POA: Diagnosis not present

## 2017-07-11 DIAGNOSIS — N2581 Secondary hyperparathyroidism of renal origin: Secondary | ICD-10-CM | POA: Diagnosis not present

## 2017-07-12 ENCOUNTER — Ambulatory Visit (HOSPITAL_COMMUNITY)
Admission: RE | Admit: 2017-07-12 | Discharge: 2017-07-12 | Disposition: A | Discharge status: Home / Self Care | Payer: Medicare Other | Source: Ambulatory Visit | Attending: Internal Medicine | Admitting: Internal Medicine

## 2017-07-12 DIAGNOSIS — I083 Combined rheumatic disorders of mitral, aortic and tricuspid valves: Secondary | ICD-10-CM | POA: Insufficient documentation

## 2017-07-12 DIAGNOSIS — I272 Pulmonary hypertension, unspecified: Secondary | ICD-10-CM | POA: Diagnosis not present

## 2017-07-12 DIAGNOSIS — I420 Dilated cardiomyopathy: Secondary | ICD-10-CM | POA: Insufficient documentation

## 2017-07-12 LAB — ECHOCARDIOGRAM COMPLETE
CHL CUP RV SYS PRESS: 78 mmHg
EERAT: 8.66
EWDT: 225 ms
FS: 36 % (ref 28–44)
IVS/LV PW RATIO, ED: 0.95
LA ID, A-P, ES: 38 mm
LA vol A4C: 39.7 ml
LA vol index: 20.3 mL/m2
LA vol: 53.2 mL
LADIAMINDEX: 1.45 cm/m2
LDCA: 2.84 cm2
LEFT ATRIUM END SYS DIAM: 38 mm
LV E/e'average: 8.66
LV PW d: 12.9 mm — AB (ref 0.6–1.1)
LV SIMPSON'S DISK: 67
LV TDI E'LATERAL: 11.9
LV dias vol index: 23 mL/m2
LV dias vol: 60 mL (ref 46–106)
LV sys vol index: 8 mL/m2
LV sys vol: 20 mL
LVEEMED: 8.66
LVELAT: 11.9 cm/s
LVOT VTI: 26.4 cm
LVOT diameter: 19 mm
LVOT peak grad rest: 5 mmHg
LVOTPV: 113 cm/s
LVOTSV: 75 mL
MV Dec: 225
MV pk A vel: 94.3 m/s
MV pk E vel: 103 m/s
MVPG: 4 mmHg
RV LATERAL S' VELOCITY: 11.3 cm/s
RV TAPSE: 20.9 mm
Reg peak vel: 398 cm/s
Stroke v: 40 ml
TDI e' medial: 5.98
TRMAXVEL: 398 cm/s

## 2017-07-12 NOTE — Progress Notes (Signed)
*  PRELIMINARY RESULTS* Echocardiogram 2D Echocardiogram has been performed.  [name] 07/12/2017, 10:34 AM

## 2017-07-13 DIAGNOSIS — N186 End stage renal disease: Secondary | ICD-10-CM | POA: Diagnosis not present

## 2017-07-13 DIAGNOSIS — Z992 Dependence on renal dialysis: Secondary | ICD-10-CM | POA: Diagnosis not present

## 2017-07-13 DIAGNOSIS — N2581 Secondary hyperparathyroidism of renal origin: Secondary | ICD-10-CM | POA: Diagnosis not present

## 2017-07-13 DIAGNOSIS — D631 Anemia in chronic kidney disease: Secondary | ICD-10-CM | POA: Diagnosis not present

## 2017-07-13 DIAGNOSIS — D509 Iron deficiency anemia, unspecified: Secondary | ICD-10-CM | POA: Diagnosis not present

## 2017-07-14 ENCOUNTER — Emergency Department (HOSPITAL_COMMUNITY): Payer: Medicare Other

## 2017-07-14 ENCOUNTER — Emergency Department (HOSPITAL_COMMUNITY)
Admission: EM | Admit: 2017-07-14 | Discharge: 2017-07-14 | Disposition: A | Discharge status: Home / Self Care | Payer: Medicare Other | Attending: Emergency Medicine | Admitting: Emergency Medicine

## 2017-07-14 ENCOUNTER — Encounter (HOSPITAL_COMMUNITY): Payer: Self-pay | Admitting: Cardiology

## 2017-07-14 DIAGNOSIS — N186 End stage renal disease: Secondary | ICD-10-CM | POA: Diagnosis not present

## 2017-07-14 DIAGNOSIS — Z992 Dependence on renal dialysis: Secondary | ICD-10-CM | POA: Diagnosis not present

## 2017-07-14 DIAGNOSIS — N1330 Unspecified hydronephrosis: Secondary | ICD-10-CM

## 2017-07-14 DIAGNOSIS — I509 Heart failure, unspecified: Secondary | ICD-10-CM | POA: Diagnosis not present

## 2017-07-14 DIAGNOSIS — R748 Abnormal levels of other serum enzymes: Secondary | ICD-10-CM | POA: Diagnosis not present

## 2017-07-14 DIAGNOSIS — J45909 Unspecified asthma, uncomplicated: Secondary | ICD-10-CM | POA: Diagnosis not present

## 2017-07-14 DIAGNOSIS — E1122 Type 2 diabetes mellitus with diabetic chronic kidney disease: Secondary | ICD-10-CM | POA: Diagnosis not present

## 2017-07-14 DIAGNOSIS — K5730 Diverticulosis of large intestine without perforation or abscess without bleeding: Secondary | ICD-10-CM | POA: Diagnosis not present

## 2017-07-14 DIAGNOSIS — R1011 Right upper quadrant pain: Secondary | ICD-10-CM | POA: Diagnosis present

## 2017-07-14 DIAGNOSIS — I110 Hypertensive heart disease with heart failure: Secondary | ICD-10-CM | POA: Insufficient documentation

## 2017-07-14 DIAGNOSIS — Z79899 Other long term (current) drug therapy: Secondary | ICD-10-CM | POA: Diagnosis not present

## 2017-07-14 DIAGNOSIS — I2510 Atherosclerotic heart disease of native coronary artery without angina pectoris: Secondary | ICD-10-CM | POA: Insufficient documentation

## 2017-07-14 DIAGNOSIS — K760 Fatty (change of) liver, not elsewhere classified: Secondary | ICD-10-CM | POA: Diagnosis not present

## 2017-07-14 DIAGNOSIS — R1111 Vomiting without nausea: Secondary | ICD-10-CM | POA: Diagnosis not present

## 2017-07-14 DIAGNOSIS — K2970 Gastritis, unspecified, without bleeding: Secondary | ICD-10-CM | POA: Diagnosis not present

## 2017-07-14 LAB — CBC WITH DIFFERENTIAL/PLATELET
BASOS ABS: 0 10*3/uL (ref 0.0–0.1)
Basophils Relative: 0 %
EOS ABS: 0.2 10*3/uL (ref 0.0–0.7)
EOS PCT: 1 %
HCT: 37.5 % (ref 36.0–46.0)
Hemoglobin: 12.5 g/dL (ref 12.0–15.0)
Lymphocytes Relative: 15 %
Lymphs Abs: 1.7 10*3/uL (ref 0.7–4.0)
MCH: 30.9 pg (ref 26.0–34.0)
MCHC: 33.3 g/dL (ref 30.0–36.0)
MCV: 92.8 fL (ref 78.0–100.0)
MONO ABS: 0.6 10*3/uL (ref 0.1–1.0)
Monocytes Relative: 5 %
Neutro Abs: 8.9 10*3/uL — ABNORMAL HIGH (ref 1.7–7.7)
Neutrophils Relative %: 79 %
Platelets: 215 10*3/uL (ref 150–400)
RBC: 4.04 MIL/uL (ref 3.87–5.11)
RDW: 14.1 % (ref 11.5–15.5)
WBC: 11.3 10*3/uL — AB (ref 4.0–10.5)

## 2017-07-14 LAB — COMPREHENSIVE METABOLIC PANEL
ALT: 14 U/L (ref 14–54)
AST: 20 U/L (ref 15–41)
Albumin: 3.9 g/dL (ref 3.5–5.0)
Alkaline Phosphatase: 100 U/L (ref 38–126)
Anion gap: 12 (ref 5–15)
BUN: 43 mg/dL — AB (ref 6–20)
CHLORIDE: 98 mmol/L — AB (ref 101–111)
CO2: 27 mmol/L (ref 22–32)
CREATININE: 5.06 mg/dL — AB (ref 0.44–1.00)
Calcium: 9.7 mg/dL (ref 8.9–10.3)
GFR calc non Af Amer: 9 mL/min — ABNORMAL LOW (ref >60)
GFR, EST AFRICAN AMERICAN: 11 mL/min — AB (ref >60)
Glucose, Bld: 162 mg/dL — ABNORMAL HIGH (ref 65–99)
POTASSIUM: 4.5 mmol/L (ref 3.5–5.1)
SODIUM: 137 mmol/L (ref 135–145)
Total Bilirubin: 0.5 mg/dL (ref 0.3–1.2)
Total Protein: 8.5 g/dL — ABNORMAL HIGH (ref 6.5–8.1)

## 2017-07-14 LAB — LIPASE, BLOOD: LIPASE: 137 U/L — AB (ref 11–51)

## 2017-07-14 MED ORDER — ONDANSETRON HCL 4 MG/2ML IJ SOLN
4.0000 mg | Freq: Once | INTRAMUSCULAR | Status: AC
Start: 2017-07-14 — End: 2017-07-14
  Administered 2017-07-14: 4 mg via INTRAVENOUS
  Filled 2017-07-14: qty 2

## 2017-07-14 MED ORDER — CEPHALEXIN 500 MG PO CAPS: 500.0000 mg | ORAL_CAPSULE | Freq: Four times a day (QID) | ORAL | 0 refills | Status: DC

## 2017-07-14 MED ORDER — DEXTROSE 5 % IV SOLN
1.0000 g | Freq: Once | INTRAVENOUS | Status: AC
  Administered 2017-07-14: 1 g via INTRAVENOUS
  Filled 2017-07-14: qty 10

## 2017-07-14 MED ORDER — HYDROCODONE-ACETAMINOPHEN 5-325 MG PO TABS: 1.0000 | ORAL_TABLET | ORAL | 0 refills | Status: DC | PRN

## 2017-07-14 MED ORDER — SODIUM CHLORIDE 0.9 % IV BOLUS (SEPSIS)
500.0000 mL | Freq: Once | INTRAVENOUS | Status: AC
  Administered 2017-07-14: 500 mL via INTRAVENOUS

## 2017-07-14 MED ORDER — MORPHINE SULFATE (PF) 4 MG/ML IV SOLN
4.0000 mg | Freq: Once | INTRAVENOUS | Status: AC
  Administered 2017-07-14: 4 mg via INTRAVENOUS
  Filled 2017-07-14: qty 1

## 2017-07-14 NOTE — ED Notes (Signed)
Placed on 3 liters oxygen [place]

## 2017-07-14 NOTE — ED Notes (Signed)
Pt is dialysis pt and states she will not urinate anymore tonight.

## 2017-07-14 NOTE — ED Provider Notes (Signed)
[facility] DEPT Provider Note   CSN: [id number] Arrival date & time: 07/14/17  1620     History   Chief Complaint Chief Complaint  Patient presents with  . Abdominal Pain    HPI ROLENA KNUTSON is a 51 y.o. female.  HPI Patient presents with acute onset right-sided upper abdominal pain associated with nausea and vomiting. Since the pain wraps around to her right flank. Pain started around noon today. No fever or chills. Patient with history of end-stage renal disease on hemodialysis. States she still makes some urine. No urinary changes including hematuria or dysuria. Patient was dialyzed yesterday.  Past Medical History:  Diagnosis Date  . Anxiety   . Asthma    as a child  . Cancer ([place])    thyroid  . Cellulitis   . CHF (congestive heart failure) ([place])   . Chiari malformation    s/p surgery  . Chiari malformation   . Chronic pain   . Complication of anesthesia 11/28/15   Resp arrest after  conscious  sedation  . Constipation   . Coronary artery disease    40-50% mid LAD 04/29/09, Medical tx. (Dr. Gwenlyn Found)  . Diabetes mellitus    Type II  . Fibromyalgia   . History of blood transfusion    hemorrage duinrg pregancy  . Hx of echocardiogram 10/2011   EF 55-60%  . Hypercholesterolemia   . Hypertension   . Lymph edema   . Obesity hypoventilation syndrome ([place])   . On home O2   . Pneumonia    in past  . Renal disorder    Pt started dialysis in Dec.2016  . S/P colonoscopy 05/26/2007   Dr. Laural Golden sigmoid diverticulosis random biopsies benign  . S/P endoscopy 05/01/2009   Dr. Penelope Coop pill-induced esophageal ulcerations distal to midesophagus, 2 small ulcers in the antrum of the stomach  . Shortness of breath dyspnea    with any exertion or if heart rate  is irregular while on dialysis  . Sleep apnea     Patient Active Problem List   Diagnosis Date Noted  . Septic shock ([place]) 01/24/2017  . Shock liver 01/24/2017  . Cor pulmonale, chronic ([place]) 01/24/2017  .  Elevated troponin   . Flu-like symptoms 01/18/2017  . Fever 01/18/2017  . HCAP (healthcare-associated pneumonia) 12/31/2016  . Numbness and tingling in right hand 08/13/2016  . Sepsis ([place]) 08/11/2016  . Coagulase negative Staphylococcus bacteremia 06/11/2016  . UTI (lower urinary tract infection) 06/06/2016  . ESRD (end stage renal disease) ([place]) 06/06/2016  . Chronic cholecystitis 12/09/2015  . Protein-calorie malnutrition, severe 11/29/2015  . Respiratory arrest ([place]) 11/28/2015  . Recurrent cellulitis of lower leg 11/13/2015  . Chronic diastolic CHF (congestive heart failure) ([place]) 11/13/2015  . Chronic respiratory failure with hypoxia ([place]) 11/13/2015  . ESRD on dialysis ([place]) 11/13/2015  . Anemia of chronic disease 11/13/2015  . Rotator cuff syndrome of right shoulder 05/30/2013  . Pain in joint, shoulder region 05/30/2013  . Obesity hypoventilation syndrome ([place]) 09/04/2012  . Leukopenia 11/11/2011  . HTN (hypertension) 11/11/2011  . HLD (hyperlipidemia) 11/11/2011  . Transaminitis 11/11/2011  . Diabetes mellitus ([place]) 10/27/2011  . Morbid obesity ([place]) 10/27/2011  . Lymphedema of lower extremity 10/04/2011    Past Surgical History:  Procedure Laterality Date  . Marland KitchenHemodialysis catheter Right 11/28/2015  . ABDOMINAL HYSTERECTOMY    . ADENOIDECTOMY    . AV FISTULA PLACEMENT Left 03/15/2016   Procedure: CREATION OF LEFT ARM ARTERIOVENOUS (AV) FISTULA  ;  Surgeon: Rosetta Posner, MD;  Location: [facility];  Service: Vascular;  Laterality: Left;  . CESAREAN SECTION      x 2  . CRANIECTOMY SUBOCCIPITAL W/ CERVICAL LAMINECTOMY / CHIARI    . FISTULA SUPERFICIALIZATION Left 05/10/2016   Procedure: Left Arm FISTULA SUPERFICIALIZATION;  Surgeon: Rosetta Posner, MD;  Location: [facility] OR;  Service: Vascular;  Laterality: Left;  . IR GENERIC HISTORICAL  08/14/2016   IR REMOVAL TUN ACCESS W/ PORT W/O FL MOD SED 08/14/2016 Arne Cleveland, MD [facility]-INTER[facility] RAD  . PERIPHERAL VASCULAR CATHETERIZATION Left  11/25/2016   Procedure: A/V Fistulagram;  Surgeon: Conrad Vienna, MD;  Location: [facility] CV LAB;  Service: Cardiovascular;  Laterality: Left;  arm  . PORTACATH PLACEMENT  07/05/2012   Procedure: INSERTION PORT-A-CATH;  Surgeon: Donato Heinz, MD;  Location: [facility];  Service: General;  Laterality: Left;  subclavian  . THYROIDECTOMY, PARTIAL    . TONSILLECTOMY      OB History    Gravida Para Term Preterm AB Living   _0 SAB TAB Ectopic Multiple Live Births   1               Home Medications    Prior to Admission medications   Medication Sig Start Date End Date Taking? Authorizing Provider  acetaminophen (TYLENOL) 325 MG tablet Take 650 mg by mouth every 6 (six) hours as needed for moderate pain or fever.    Yes [provider]  albuterol (PROVENTIL) (2.5 MG/3ML) 0.083% nebulizer solution Take 2.5 mg by nebulization every 6 (six) hours as needed for wheezing or shortness of breath.   Yes [provider]  ALPRAZolam Duanne Moron) 0.5 MG tablet Take 0.5 mg by mouth 3 (three) times daily as needed for anxiety or sleep.    Yes [provider]  amLODipine (NORVASC) 10 MG tablet Take 0.5 tablets (5 mg total) by mouth daily. 06/11/16  Yes [name], MD  diphenhydrAMINE (BENADRYL) 25 mg capsule Take 25 mg by mouth every 6 (six) hours as needed for allergies.   Yes [provider]  epoetin alfa (EPOGEN,PROCRIT) 4000 UNIT/ML injection Inject 2 mLs (8,000 Units total) into the vein every Monday, Wednesday, and Friday with hemodialysis. 02/02/17  Yes [name], MD  metoprolol (LOPRESSOR) 50 MG tablet Take 0.5 tablets (25 mg total) by mouth 2 (two) times daily. 12/04/15  Yes [name], MD  ondansetron (ZOFRAN) 4 MG tablet Take 1 tablet (4 mg total) by mouth every 6 (six) hours as needed for nausea. 02/01/17  Yes [name], MD  sevelamer carbonate (RENVELA) 800 MG tablet Take 1,600-3,200 mg by mouth See admin instructions. Take 3200 mg by  mouth 3 times daily with meals and take 1600 mg by mouth with snacks.   Yes [provider]  cephALEXin (KEFLEX) 500 MG capsule Take 1 capsule (500 mg total) by mouth 4 (four) times daily. 07/14/17   Julianne Rice, MD  HYDROcodone-acetaminophen (NORCO) 5-325 MG tablet Take 1-2 tablets by mouth every 4 (four) hours as needed. 07/14/17   Julianne Rice, MD  Oxycodone HCl 10 MG TABS Take 1 tablet (10 mg total) by mouth 4 (four) times daily as needed (pain). 02/01/17   [name], MD    Family History Family History  Problem Relation Age of Onset  . Colon cancer Mother 83  . Stroke Mother 22  . Coronary artery disease Mother     Social History Social History  Substance Use Topics  . Smoking status: Never Smoker  . Smokeless tobacco: Never Used  . Alcohol use No     Allergies   Contrast media [iodinated diagnostic agents]; Pneumococcal vaccines; and Vancomycin   Review of Systems Review of Systems  Constitutional: Negative for chills and fever.  Respiratory: Negative for cough and shortness of breath.   Cardiovascular: Negative for chest pain.  Gastrointestinal: Positive for abdominal pain, nausea and vomiting. Negative for blood in stool, constipation and diarrhea.  Genitourinary: Positive for flank pain. Negative for dysuria, frequency and hematuria.  Musculoskeletal: Positive for back pain and myalgias. Negative for neck stiffness.  Skin: Negative for rash and wound.  Neurological: Negative for syncope, weakness and numbness.  All other systems reviewed and are negative.    Physical Exam Updated Vital Signs BP (!) 152/75   Pulse 77   Temp 97.8 F (36.6 C) (Oral)   Resp 16   Ht _0  (1.651 m)   Wt (!) 138.8 kg (306 lb)   SpO2 92%   BMI 50.92 kg/m   Physical Exam  Constitutional: She is oriented to person, place, and time. She appears well-developed and well-nourished. No distress.  HENT:  Head: Normocephalic and atraumatic.  Mouth/Throat:  Oropharynx is clear and moist. No oropharyngeal exudate.  Eyes: Pupils are equal, round, and reactive to light. EOM are normal.  Neck: Normal range of motion. Neck supple.  Cardiovascular: Normal rate and regular rhythm.  Exam reveals no gallop and no friction rub.   No murmur heard. Pulmonary/Chest: Effort normal and breath sounds normal. No respiratory distress. She has no wheezes. She has no rales. She exhibits no tenderness.  Abdominal: Soft. Bowel sounds are normal. There is tenderness (patient with right upper and right lower quadrant tenderness to palpation.). There is no rebound and no guarding.  Musculoskeletal: Normal range of motion. She exhibits tenderness. She exhibits no edema.  Right CVA tenderness with palpation. No midline thoracic or lumbar tenderness. No lower extremity swelling or asymmetry. Left upper extremity fistula with palpable thrill.  Neurological: She is alert and oriented to person, place, and time.  Moving all extremities without focal deficit. Sensation fully intact.  Skin: Skin is warm and dry. Capillary refill takes less than 2 seconds. No rash noted. No erythema.  Psychiatric: She has a normal mood and affect. Her behavior is normal.  Nursing note and vitals reviewed.    ED Treatments / Results  Labs (all labs ordered are listed, but only abnormal results are displayed) Labs Reviewed  CBC WITH DIFFERENTIAL/PLATELET - Abnormal; Notable for the following:       Result Value   WBC 11.3 (*)    Neutro Abs 8.9 (*)    All other components within normal limits  COMPREHENSIVE METABOLIC PANEL - Abnormal; Notable for the following:    Chloride 98 (*)    Glucose, Bld 162 (*)    BUN 43 (*)    Creatinine, Ser 5.06 (*)    Total Protein 8.5 (*)    GFR calc non Af Amer 9 (*)    GFR calc Af Amer 11 (*)    All other components within normal limits  LIPASE, BLOOD - Abnormal; Notable for the following:    Lipase 137 (*)    All other components within normal limits    URINALYSIS, ROUTINE W REFLEX MICROSCOPIC    EKG  EKG Interpretation None       Radiology US Abdomen Limited  Result Date: 07/14/2017 CLINICAL DATA:  Right upper quadrant abdomen pain for 3 hours EXAM: ULTRASOUND ABDOMEN LIMITED RIGHT UPPER QUADRANT COMPARISON:  January 27, 2017 FINDINGS: Gallbladder: The gallbladder is contracted limiting evaluation. No sonographic Murphy sign noted by sonographer. Common bile duct: Diameter: 3 mm Liver: There is diffuse increased echotexture of the liver. Incidental finding of right hydronephrosis. IMPRESSION: Contracted gallbladder limiting evaluation. No sonographic Percell Miller sign is identified. Incidental finding of right hydronephrosis. Fatty infiltration of liver. Electronically Signed   By: Abelardo Diesel M.D.   On: 07/14/2017 17:45   Ct Renal Stone Study  Result Date: 07/14/2017 CLINICAL DATA:  Right flank pain with vomiting EXAM: CT ABDOMEN AND PELVIS WITHOUT CONTRAST TECHNIQUE: Multidetector CT imaging of the abdomen and pelvis was performed following the standard protocol without IV contrast. COMPARISON:  Ultrasound 07/14/2017, CT 08/12/2016 FINDINGS: Lower chest: Lung bases demonstrate no acute consolidation or pleural effusion. There is mild cardiomegaly. Hepatobiliary: No focal hepatic abnormality. Gallbladder not well seen, possibly contracted. No biliary dilatation. Pancreas: Unremarkable. No pancreatic ductal dilatation or surrounding inflammatory changes. Spleen: Normal in size without focal abnormality. Adrenals/Urinary Tract: Adrenal glands are within normal limits. Moderate right perinephric fat stranding. Mild to moderate right hydronephrosis and hydroureter, however no definitive stone is seen along the course of the right ureter. Negative for hydronephrosis or ureteral stone on the left. The bladder is nearly empty. Stomach/Bowel: Stomach is within normal limits. Appendix not well seen consistent with history of appendectomy No evidence of  bowel wall thickening, distention, or inflammatory changes. Sigmoid colon diverticular disease without acute inflammation Vascular/Lymphatic: Aortic atherosclerosis. No enlarged abdominal or pelvic lymph nodes. Reproductive: Status post hysterectomy. No adnexal masses. Other: Midline incisional scar. Fat in the umbilicus. No free air or free fluid. Musculoskeletal: No acute or significant osseous findings. IMPRESSION: 1. Mild to moderate right hydronephrosis and hydroureter with perinephric fat stranding, however no discrete stone is seen along the course of right ureter, findings could be secondary to recently passed stone 2. Sigmoid colon diverticular disease without acute inflammation 3. Poorly visible gallbladder possibly due to contraction. Electronically Signed   By: Donavan Foil M.D.   On: 07/14/2017 18:24    Procedures Procedures (including critical care time)  Medications Ordered in ED Medications  sodium chloride 0.9 % bolus 500 mL (0 mLs Intravenous Stopped 07/14/17 1933)  morphine 4 MG/ML injection 4 mg (4 mg Intravenous Given 07/14/17 1655)  ondansetron (ZOFRAN) injection 4 mg (4 mg Intravenous Given 07/14/17 1655)  morphine 4 MG/ML injection 4 mg (4 mg Intravenous Given 07/14/17 1921)  cefTRIAXone (ROCEPHIN) 1 g in dextrose 5 % 50 mL IVPB (0 g Intravenous Stopped 07/14/17 2212)     Initial Impression / Assessment and Plan / ED Course  I have reviewed the triage vital signs and the nursing notes.  Pertinent labs & imaging results that were available during my care of the patient were reviewed by me and considered in my medical decision making (see chart for details).   Patient is unable to provide urine sample. Discussed CT findings with urology on call. Recommends treating for possible infection. Given IV Rocephin.  Patient's currently pain-free, appears much more comfortable. Abdominal exam is benign. Patient seems to be asymptomatic. Vital signs stable. We'll discharge home with  prescription for Keflex. Advised to follow-up with urology and her primary physician. Return precautions have been given. Final Clinical Impressions(s) / ED Diagnoses   Final diagnoses:  Hydronephrosis, unspecified hydronephrosis type  Elevated lipase    New Prescriptions New Prescriptions   CEPHALEXIN (  KEFLEX) 500 MG CAPSULE    Take 1 capsule (500 mg total) by mouth 4 (four) times daily.   HYDROCODONE-ACETAMINOPHEN (NORCO) 5-325 MG TABLET    Take 1-2 tablets by mouth every 4 (four) hours as needed.     Julianne Rice, MD 07/14/17 2214

## 2017-07-14 NOTE — ED Notes (Signed)
Pt wheeled to waiting room. Pt verbalized understanding of discharge instructions.

## 2017-07-14 NOTE — ED Triage Notes (Signed)
Right flank pain times 2 hours. Vomited times 3.

## 2017-07-15 DIAGNOSIS — N186 End stage renal disease: Secondary | ICD-10-CM | POA: Diagnosis not present

## 2017-07-15 DIAGNOSIS — D631 Anemia in chronic kidney disease: Secondary | ICD-10-CM | POA: Diagnosis not present

## 2017-07-15 DIAGNOSIS — N2581 Secondary hyperparathyroidism of renal origin: Secondary | ICD-10-CM | POA: Diagnosis not present

## 2017-07-15 DIAGNOSIS — Z992 Dependence on renal dialysis: Secondary | ICD-10-CM | POA: Diagnosis not present

## 2017-07-15 DIAGNOSIS — D509 Iron deficiency anemia, unspecified: Secondary | ICD-10-CM | POA: Diagnosis not present

## 2017-07-18 DIAGNOSIS — Z992 Dependence on renal dialysis: Secondary | ICD-10-CM | POA: Diagnosis not present

## 2017-07-18 DIAGNOSIS — N2581 Secondary hyperparathyroidism of renal origin: Secondary | ICD-10-CM | POA: Diagnosis not present

## 2017-07-18 DIAGNOSIS — D509 Iron deficiency anemia, unspecified: Secondary | ICD-10-CM | POA: Diagnosis not present

## 2017-07-18 DIAGNOSIS — N186 End stage renal disease: Secondary | ICD-10-CM | POA: Diagnosis not present

## 2017-07-18 DIAGNOSIS — D631 Anemia in chronic kidney disease: Secondary | ICD-10-CM | POA: Diagnosis not present

## 2017-07-19 DIAGNOSIS — Z992 Dependence on renal dialysis: Secondary | ICD-10-CM | POA: Diagnosis not present

## 2017-07-19 DIAGNOSIS — N186 End stage renal disease: Secondary | ICD-10-CM | POA: Diagnosis not present

## 2017-07-20 ENCOUNTER — Telehealth: Payer: Self-pay

## 2017-07-20 DIAGNOSIS — D631 Anemia in chronic kidney disease: Secondary | ICD-10-CM | POA: Diagnosis not present

## 2017-07-20 DIAGNOSIS — N186 End stage renal disease: Secondary | ICD-10-CM | POA: Diagnosis not present

## 2017-07-20 DIAGNOSIS — Z992 Dependence on renal dialysis: Secondary | ICD-10-CM | POA: Diagnosis not present

## 2017-07-20 DIAGNOSIS — E8770 Fluid overload, unspecified: Secondary | ICD-10-CM | POA: Diagnosis not present

## 2017-07-20 DIAGNOSIS — D509 Iron deficiency anemia, unspecified: Secondary | ICD-10-CM | POA: Diagnosis not present

## 2017-07-20 DIAGNOSIS — N2581 Secondary hyperparathyroidism of renal origin: Secondary | ICD-10-CM | POA: Diagnosis not present

## 2017-07-20 NOTE — Telephone Encounter (Signed)
Pt called to see if Dr. Harrington Challenger had concluded if she could have her procedure done. Please advise.

## 2017-07-22 ENCOUNTER — Telehealth: Payer: Self-pay

## 2017-07-22 DIAGNOSIS — Z01818 Encounter for other preprocedural examination: Secondary | ICD-10-CM

## 2017-07-22 DIAGNOSIS — I270 Primary pulmonary hypertension: Secondary | ICD-10-CM

## 2017-07-22 DIAGNOSIS — Z992 Dependence on renal dialysis: Secondary | ICD-10-CM | POA: Diagnosis not present

## 2017-07-22 DIAGNOSIS — D509 Iron deficiency anemia, unspecified: Secondary | ICD-10-CM | POA: Diagnosis not present

## 2017-07-22 DIAGNOSIS — E8770 Fluid overload, unspecified: Secondary | ICD-10-CM | POA: Diagnosis not present

## 2017-07-22 DIAGNOSIS — N186 End stage renal disease: Secondary | ICD-10-CM | POA: Diagnosis not present

## 2017-07-22 DIAGNOSIS — D631 Anemia in chronic kidney disease: Secondary | ICD-10-CM | POA: Diagnosis not present

## 2017-07-22 DIAGNOSIS — N2581 Secondary hyperparathyroidism of renal origin: Secondary | ICD-10-CM | POA: Diagnosis not present

## 2017-07-22 NOTE — Telephone Encounter (Signed)
Right heart cath with Dr Aundra Dubin scheduled for Thursday , August 8 th at 9 am, pt to arrive at 7 am to Cath Lab.karen in Cath lab told patient is dialysis pt, has dye allergy but no dye is being given per Dr.Ross, only measurements taken.   She will have lab work done Tuesday August 7th at [facility], written instructions will be given to her then.She verbalized understanding to be NPO after midnight, take am meds with sip of water, bring adult driver with her and pack over nite bag.

## 2017-07-22 NOTE — Telephone Encounter (Signed)
-----  Message from Fay Records, MD sent at 07/22/2017  1:18 PM EDT ----- Please set pt up for a R heart cath by Loralie Champagne.  Needs to be on a Tues or Thursday as she has dialysis on the other days.

## 2017-07-25 ENCOUNTER — Other Ambulatory Visit: Payer: Self-pay

## 2017-07-25 DIAGNOSIS — E8770 Fluid overload, unspecified: Secondary | ICD-10-CM | POA: Diagnosis not present

## 2017-07-25 DIAGNOSIS — D509 Iron deficiency anemia, unspecified: Secondary | ICD-10-CM | POA: Diagnosis not present

## 2017-07-25 DIAGNOSIS — N2581 Secondary hyperparathyroidism of renal origin: Secondary | ICD-10-CM | POA: Diagnosis not present

## 2017-07-25 DIAGNOSIS — N186 End stage renal disease: Secondary | ICD-10-CM | POA: Diagnosis not present

## 2017-07-25 DIAGNOSIS — Z992 Dependence on renal dialysis: Secondary | ICD-10-CM | POA: Diagnosis not present

## 2017-07-25 DIAGNOSIS — D631 Anemia in chronic kidney disease: Secondary | ICD-10-CM | POA: Diagnosis not present

## 2017-07-25 NOTE — Progress Notes (Signed)
rec'd request from [facility] in [place] to schedule Fistulogram of left arm due to "whistling in AVF".  Phone call to pt.  Scheduled Fistulogram 07/26/17; pre-procedure instructions given.  Advised to arrive to Admitting at [facility] at 8:00 AM/ procedure @ 10:00 AM.  Verb. Understanding.

## 2017-07-26 ENCOUNTER — Ambulatory Visit (HOSPITAL_COMMUNITY)
Admission: RE | Admit: 2017-07-26 | Discharge: 2017-07-26 | Disposition: A | Discharge status: Home / Self Care | Payer: Medicare Other | Source: Ambulatory Visit | Attending: Surgery | Admitting: Surgery

## 2017-07-26 ENCOUNTER — Encounter (HOSPITAL_COMMUNITY)
Admission: RE | Disposition: A | Discharge status: Home / Self Care | Payer: Self-pay | Source: Ambulatory Visit | Attending: Surgery

## 2017-07-26 DIAGNOSIS — Z4901 Encounter for fitting and adjustment of extracorporeal dialysis catheter: Secondary | ICD-10-CM | POA: Insufficient documentation

## 2017-07-26 DIAGNOSIS — Z992 Dependence on renal dialysis: Secondary | ICD-10-CM | POA: Diagnosis not present

## 2017-07-26 DIAGNOSIS — N186 End stage renal disease: Secondary | ICD-10-CM | POA: Diagnosis not present

## 2017-07-26 DIAGNOSIS — T82898A Other specified complication of vascular prosthetic devices, implants and grafts, initial encounter: Secondary | ICD-10-CM | POA: Diagnosis not present

## 2017-07-26 HISTORY — PX: A/V FISTULAGRAM: CATH118298

## 2017-07-26 LAB — CBC
HEMATOCRIT: 33.8 % — AB (ref 36.0–46.0)
Hemoglobin: 11.1 g/dL — ABNORMAL LOW (ref 12.0–15.0)
MCH: 30.2 pg (ref 26.0–34.0)
MCHC: 32.8 g/dL (ref 30.0–36.0)
MCV: 91.8 fL (ref 78.0–100.0)
PLATELETS: 199 10*3/uL (ref 150–400)
RBC: 3.68 MIL/uL — ABNORMAL LOW (ref 3.87–5.11)
RDW: 14.1 % (ref 11.5–15.5)
WBC: 8.7 10*3/uL (ref 4.0–10.5)

## 2017-07-26 LAB — POCT I-STAT, CHEM 8
BUN: 37 mg/dL — ABNORMAL HIGH (ref 6–20)
CALCIUM ION: 1.06 mmol/L — AB (ref 1.15–1.40)
CHLORIDE: 104 mmol/L (ref 101–111)
Creatinine, Ser: 4.8 mg/dL — ABNORMAL HIGH (ref 0.44–1.00)
GLUCOSE: 142 mg/dL — AB (ref 65–99)
HCT: 38 % (ref 36.0–46.0)
HEMOGLOBIN: 12.9 g/dL (ref 12.0–15.0)
Potassium: 4.2 mmol/L (ref 3.5–5.1)
Sodium: 138 mmol/L (ref 135–145)
TCO2: 27 mmol/L (ref 0–100)

## 2017-07-26 LAB — PROTIME-INR
INR: 0.95
Prothrombin Time: 12.6 seconds (ref 11.4–15.2)

## 2017-07-26 LAB — BASIC METABOLIC PANEL
ANION GAP: 13 (ref 5–15)
BUN: 35 mg/dL — ABNORMAL HIGH (ref 6–20)
CALCIUM: 9.6 mg/dL (ref 8.9–10.3)
CO2: 27 mmol/L (ref 22–32)
CREATININE: 4.55 mg/dL — AB (ref 0.44–1.00)
Chloride: 100 mmol/L — ABNORMAL LOW (ref 101–111)
GFR calc Af Amer: 12 mL/min — ABNORMAL LOW (ref >60)
GFR, EST NON AFRICAN AMERICAN: 10 mL/min — AB (ref >60)
GLUCOSE: 144 mg/dL — AB (ref 65–99)
Potassium: 4.2 mmol/L (ref 3.5–5.1)
Sodium: 140 mmol/L (ref 135–145)

## 2017-07-26 SURGERY — A/V FISTULAGRAM
Anesthesia: LOCAL

## 2017-07-26 MED ORDER — DOCUSATE SODIUM 100 MG PO CAPS: 100.0000 mg | ORAL_CAPSULE | Freq: Every day | ORAL | Status: DC

## 2017-07-26 MED ORDER — FAMOTIDINE IN NACL 20-0.9 MG/50ML-% IV SOLN
INTRAVENOUS | Status: AC
  Administered 2017-07-26: 20 mg via INTRAVENOUS
  Filled 2017-07-26: qty 50

## 2017-07-26 MED ORDER — LABETALOL HCL 5 MG/ML IV SOLN: 10.0000 mg | INTRAVENOUS | Status: DC | PRN

## 2017-07-26 MED ORDER — MIDAZOLAM HCL 2 MG/2ML IJ SOLN
INTRAMUSCULAR | Status: AC
  Filled 2017-07-26: qty 2

## 2017-07-26 MED ORDER — FAMOTIDINE IN NACL 20-0.9 MG/50ML-% IV SOLN
20.0000 mg | INTRAVENOUS | Status: AC
  Administered 2017-07-26: 20 mg via INTRAVENOUS

## 2017-07-26 MED ORDER — GUAIFENESIN-DM 100-10 MG/5ML PO SYRP: 15.0000 mL | ORAL_SOLUTION | ORAL | Status: DC | PRN

## 2017-07-26 MED ORDER — ONDANSETRON HCL 4 MG/2ML IJ SOLN: 4.0000 mg | Freq: Four times a day (QID) | INTRAMUSCULAR | Status: DC | PRN

## 2017-07-26 MED ORDER — LIDOCAINE HCL (PF) 1 % IJ SOLN
INTRAMUSCULAR | Status: AC
  Filled 2017-07-26: qty 30

## 2017-07-26 MED ORDER — ACETAMINOPHEN 325 MG PO TABS: 325.0000 mg | ORAL_TABLET | ORAL | Status: DC | PRN

## 2017-07-26 MED ORDER — IODIXANOL 320 MG/ML IV SOLN
INTRAVENOUS | Status: DC | PRN
Start: 2017-07-26 — End: 2017-07-26
  Administered 2017-07-26: 37 mL via INTRAVENOUS

## 2017-07-26 MED ORDER — METOPROLOL TARTRATE 5 MG/5ML IV SOLN: 2.0000 mg | INTRAVENOUS | Status: DC | PRN

## 2017-07-26 MED ORDER — DIPHENHYDRAMINE HCL 50 MG/ML IJ SOLN
INTRAMUSCULAR | Status: AC
  Administered 2017-07-26: 25 mg via INTRAVENOUS
  Filled 2017-07-26: qty 1

## 2017-07-26 MED ORDER — ALUM & MAG HYDROXIDE-SIMETH 200-200-20 MG/5ML PO SUSP: 15.0000 mL | ORAL | Status: DC | PRN

## 2017-07-26 MED ORDER — PHENOL 1.4 % MT LIQD: 1.0000 | OROMUCOSAL | Status: DC | PRN

## 2017-07-26 MED ORDER — HEPARIN (PORCINE) IN NACL 2-0.9 UNIT/ML-% IJ SOLN
INTRAMUSCULAR | Status: AC
  Filled 2017-07-26: qty 500

## 2017-07-26 MED ORDER — HEPARIN (PORCINE) IN NACL 2-0.9 UNIT/ML-% IJ SOLN
INTRAMUSCULAR | Status: AC | PRN
  Administered 2017-07-26: 500 mL

## 2017-07-26 MED ORDER — SODIUM CHLORIDE 0.9% FLUSH: 3.0000 mL | INTRAVENOUS | Status: DC | PRN

## 2017-07-26 MED ORDER — ACETAMINOPHEN 325 MG RE SUPP: 325.0000 mg | RECTAL | Status: DC | PRN

## 2017-07-26 MED ORDER — METHYLPREDNISOLONE SODIUM SUCC 125 MG IJ SOLR
125.0000 mg | INTRAMUSCULAR | Status: AC
  Administered 2017-07-26: 125 mg via INTRAVENOUS

## 2017-07-26 MED ORDER — FENTANYL CITRATE (PF) 100 MCG/2ML IJ SOLN
INTRAMUSCULAR | Status: DC | PRN
  Administered 2017-07-26: 25 ug via INTRAVENOUS

## 2017-07-26 MED ORDER — HYDRALAZINE HCL 20 MG/ML IJ SOLN: 5.0000 mg | INTRAMUSCULAR | Status: DC | PRN

## 2017-07-26 MED ORDER — LIDOCAINE HCL (PF) 1 % IJ SOLN
INTRAMUSCULAR | Status: DC | PRN
  Administered 2017-07-26: 2 mL via INTRADERMAL

## 2017-07-26 MED ORDER — METHYLPREDNISOLONE SODIUM SUCC 125 MG IJ SOLR
INTRAMUSCULAR | Status: AC
  Administered 2017-07-26: 125 mg via INTRAVENOUS
  Filled 2017-07-26: qty 2

## 2017-07-26 MED ORDER — FENTANYL CITRATE (PF) 100 MCG/2ML IJ SOLN
INTRAMUSCULAR | Status: AC
  Filled 2017-07-26: qty 2

## 2017-07-26 MED ORDER — MIDAZOLAM HCL 2 MG/2ML IJ SOLN
INTRAMUSCULAR | Status: DC | PRN
  Administered 2017-07-26: 1 mg via INTRAVENOUS

## 2017-07-26 MED ORDER — DIPHENHYDRAMINE HCL 50 MG/ML IJ SOLN
25.0000 mg | INTRAMUSCULAR | Status: AC
  Administered 2017-07-26: 25 mg via INTRAVENOUS

## 2017-07-26 SURGICAL SUPPLY — 9 items
BAG SNAP BAND KOVER 36X36 (MISCELLANEOUS) ×2 IMPLANT
COVER DOME SNAP 22 D (MISCELLANEOUS) ×2 IMPLANT
COVER PRB 48X5XTLSCP FOLD TPE (BAG) ×1 IMPLANT
COVER PROBE 5X48 (BAG) ×1
KIT MICROINTRODUCER STIFF 5F (SHEATH) ×2 IMPLANT
PROTECTION STATION PRESSURIZED (MISCELLANEOUS) ×2
STATION PROTECTION PRESSURIZED (MISCELLANEOUS) ×1 IMPLANT
TRAY PV CATH (CUSTOM PROCEDURE TRAY) ×2 IMPLANT
TUBING CIL FLEX 10 FLL-RA (TUBING) ×2 IMPLANT

## 2017-07-26 NOTE — Discharge Instructions (Signed)

## 2017-07-26 NOTE — H&P (Signed)
Patient name: [name] MRN: [id number] DOB: Nov 16, 1966 Sex: female  REASON FOR VISIT:     HD trouble  HISTORY OF PRESENT ILLNESS:   [name] is a 51 y.o. female who is s/p ;eft R-C AVF in 2017, and superficial mobilization in 2017. She is having trouble with HD now, and they hear a loud noise at her anastamosis.  Fistulogram in 2017 showed no problems  CURRENT MEDICATIONS:    No current facility-administered medications for this encounter.     REVIEW OF SYSTEMS:   _0  denotes positive finding, _1  denotes negative finding Cardiac  Comments:  Chest pain or chest pressure:    Shortness of breath upon exertion:    Short of breath when lying flat:    Irregular heart rhythm:    Constitutional    Fever or chills:      PHYSICAL EXAM:   Vitals:   07/26/17 0849  BP: (!) 161/86  Pulse: 77  Temp: 97.8 F (36.6 C)  TempSrc: Oral  SpO2: 95%  Weight: (!) 302 lb 0.5 oz (137 kg)  Height: 5' 5" (1.651 m)    GENERAL: The patient is a well-nourished female, in no acute distress. The vital signs are documented above. CARDIOVASCULAR: There is a regular rate and rhythm. PULMONARY: Non-labored respirations Palpable thrill in AVF.  NO edema  STUDIES:   none   MEDICAL ISSUES:   ESRD:  Plan for fistulogram with intervention if indicated.  Risks and benefits discussed.  All questions answered  Annamarie Major, MD [facility] [phone] Pager [phone]

## 2017-07-26 NOTE — Op Note (Signed)
    Patient name: [name] MRN: [id number] DOB: [birth year] Sex: female  07/26/2017 Pre-operative Diagnosis: ESRD Post-operative diagnosis:  Same Surgeon:  Annamarie Major Procedure Performed:  1.  Ultrasound-guided access, left radiocephalic fistula  2.  Fistulogram  3.  Conscious sedation of (11 minutes)    Indications:  The patient was sent over by her dialysis center for difficulty with access.  She is here for further evaluation.  Procedure:  The patient was identified in the holding area and taken to room 8.  The patient was then placed supine on the table and prepped and draped in the usual sterile fashion.  A time out was called.  Conscious sedation was administered with the use of IV fentanyl and Versed a continuous physician and nurse monitoring.  Heart rate, blood pressure, and oxygen saturations were continues to monitor Ultrasound was used to evaluate the fistula.  The vein was patent and compressible.  A digital ultrasound image was acquired.  The fistula was then accessed under ultrasound guidance using a micropuncture needle.  An 018 wire was then asvanced without resistance and a micropuncture sheath was placed.  Contrast injections were then performed through the sheath.  Findings:  The central venous system is widely patent.  The cephalic vein is widely patent in the left arm, however it is tortuous between the antecubital crease and the wrist.  The artery of venous anastomosis is widely patent   Intervention:  None  Impression:  #1  widely patent left radiocephalic fistula without evidence of central venous stenosis or anastomotic narrowing.  #2  tortuous fistula and forearm    V. Annamarie Major, M.D. [facility] Office: [phone] Pager:  [phone]

## 2017-07-27 ENCOUNTER — Encounter (HOSPITAL_COMMUNITY): Payer: Self-pay | Admitting: Surgery

## 2017-07-27 DIAGNOSIS — E8770 Fluid overload, unspecified: Secondary | ICD-10-CM | POA: Diagnosis not present

## 2017-07-27 DIAGNOSIS — D509 Iron deficiency anemia, unspecified: Secondary | ICD-10-CM | POA: Diagnosis not present

## 2017-07-27 DIAGNOSIS — D631 Anemia in chronic kidney disease: Secondary | ICD-10-CM | POA: Diagnosis not present

## 2017-07-27 DIAGNOSIS — N186 End stage renal disease: Secondary | ICD-10-CM | POA: Diagnosis not present

## 2017-07-27 DIAGNOSIS — Z992 Dependence on renal dialysis: Secondary | ICD-10-CM | POA: Diagnosis not present

## 2017-07-27 DIAGNOSIS — N2581 Secondary hyperparathyroidism of renal origin: Secondary | ICD-10-CM | POA: Diagnosis not present

## 2017-07-28 ENCOUNTER — Encounter (HOSPITAL_COMMUNITY): Admission: RE | Payer: Self-pay | Source: Ambulatory Visit

## 2017-07-28 ENCOUNTER — Ambulatory Visit (HOSPITAL_COMMUNITY): Admission: RE | Admit: 2017-07-28 | Payer: Medicare Other | Source: Ambulatory Visit | Admitting: Cardiology

## 2017-07-28 DIAGNOSIS — D509 Iron deficiency anemia, unspecified: Secondary | ICD-10-CM | POA: Diagnosis not present

## 2017-07-28 DIAGNOSIS — D631 Anemia in chronic kidney disease: Secondary | ICD-10-CM | POA: Diagnosis not present

## 2017-07-28 DIAGNOSIS — E8770 Fluid overload, unspecified: Secondary | ICD-10-CM | POA: Diagnosis not present

## 2017-07-28 DIAGNOSIS — N2581 Secondary hyperparathyroidism of renal origin: Secondary | ICD-10-CM | POA: Diagnosis not present

## 2017-07-28 DIAGNOSIS — Z992 Dependence on renal dialysis: Secondary | ICD-10-CM | POA: Diagnosis not present

## 2017-07-28 DIAGNOSIS — N186 End stage renal disease: Secondary | ICD-10-CM | POA: Diagnosis not present

## 2017-07-28 SURGERY — RIGHT HEART CATH
Anesthesia: LOCAL

## 2017-07-29 ENCOUNTER — Telehealth (HOSPITAL_COMMUNITY): Payer: Self-pay | Admitting: *Deleted

## 2017-07-29 ENCOUNTER — Telehealth: Payer: Self-pay | Admitting: Internal Medicine

## 2017-07-29 DIAGNOSIS — N186 End stage renal disease: Secondary | ICD-10-CM | POA: Diagnosis not present

## 2017-07-29 DIAGNOSIS — E8770 Fluid overload, unspecified: Secondary | ICD-10-CM | POA: Diagnosis not present

## 2017-07-29 DIAGNOSIS — D509 Iron deficiency anemia, unspecified: Secondary | ICD-10-CM | POA: Diagnosis not present

## 2017-07-29 DIAGNOSIS — D631 Anemia in chronic kidney disease: Secondary | ICD-10-CM | POA: Diagnosis not present

## 2017-07-29 DIAGNOSIS — Z992 Dependence on renal dialysis: Secondary | ICD-10-CM | POA: Diagnosis not present

## 2017-07-29 DIAGNOSIS — N2581 Secondary hyperparathyroidism of renal origin: Secondary | ICD-10-CM | POA: Diagnosis not present

## 2017-07-29 NOTE — Telephone Encounter (Signed)
Called pt. , rescheduled her cath for August 16 @ 7:30 with Dr. Aundra Dubin.

## 2017-07-29 NOTE — Telephone Encounter (Signed)
Please give pt a call- [phone]-she's needing to r/s her cath

## 2017-07-29 NOTE — Telephone Encounter (Signed)
Patient called to reschedule her cath that was cancelled yesterday with Dr. Aundra Dubin.  I called her back and told her she needed to call the cardiology office in [place] who was the ordering office.  I also sent a staff message to the ordering RN.  Patient understands and will call them herself.

## 2017-08-01 DIAGNOSIS — E8770 Fluid overload, unspecified: Secondary | ICD-10-CM | POA: Diagnosis not present

## 2017-08-01 DIAGNOSIS — D509 Iron deficiency anemia, unspecified: Secondary | ICD-10-CM | POA: Diagnosis not present

## 2017-08-01 DIAGNOSIS — Z992 Dependence on renal dialysis: Secondary | ICD-10-CM | POA: Diagnosis not present

## 2017-08-01 DIAGNOSIS — N186 End stage renal disease: Secondary | ICD-10-CM | POA: Diagnosis not present

## 2017-08-01 DIAGNOSIS — N2581 Secondary hyperparathyroidism of renal origin: Secondary | ICD-10-CM | POA: Diagnosis not present

## 2017-08-01 DIAGNOSIS — D631 Anemia in chronic kidney disease: Secondary | ICD-10-CM | POA: Diagnosis not present

## 2017-08-03 DIAGNOSIS — N2581 Secondary hyperparathyroidism of renal origin: Secondary | ICD-10-CM | POA: Diagnosis not present

## 2017-08-03 DIAGNOSIS — D631 Anemia in chronic kidney disease: Secondary | ICD-10-CM | POA: Diagnosis not present

## 2017-08-03 DIAGNOSIS — D509 Iron deficiency anemia, unspecified: Secondary | ICD-10-CM | POA: Diagnosis not present

## 2017-08-03 DIAGNOSIS — N186 End stage renal disease: Secondary | ICD-10-CM | POA: Diagnosis not present

## 2017-08-03 DIAGNOSIS — Z992 Dependence on renal dialysis: Secondary | ICD-10-CM | POA: Diagnosis not present

## 2017-08-03 DIAGNOSIS — E8770 Fluid overload, unspecified: Secondary | ICD-10-CM | POA: Diagnosis not present

## 2017-08-04 ENCOUNTER — Encounter (HOSPITAL_COMMUNITY): Payer: Self-pay | Admitting: Internal Medicine

## 2017-08-04 ENCOUNTER — Encounter (HOSPITAL_COMMUNITY)
Admission: RE | Disposition: A | Discharge status: Home / Self Care | Payer: Self-pay | Source: Ambulatory Visit | Attending: Cardiology

## 2017-08-04 ENCOUNTER — Ambulatory Visit (HOSPITAL_COMMUNITY)
Admission: RE | Admit: 2017-08-04 | Discharge: 2017-08-04 | Disposition: A | Discharge status: Home / Self Care | Payer: Medicare Other | Source: Ambulatory Visit | Attending: Cardiology | Admitting: Cardiology

## 2017-08-04 ENCOUNTER — Other Ambulatory Visit: Payer: Self-pay

## 2017-08-04 DIAGNOSIS — I2510 Atherosclerotic heart disease of native coronary artery without angina pectoris: Secondary | ICD-10-CM | POA: Diagnosis not present

## 2017-08-04 DIAGNOSIS — Z6841 Body Mass Index (BMI) 40.0 and over, adult: Secondary | ICD-10-CM | POA: Diagnosis not present

## 2017-08-04 DIAGNOSIS — Z91013 Allergy to seafood: Secondary | ICD-10-CM | POA: Insufficient documentation

## 2017-08-04 DIAGNOSIS — E1122 Type 2 diabetes mellitus with diabetic chronic kidney disease: Secondary | ICD-10-CM | POA: Diagnosis not present

## 2017-08-04 DIAGNOSIS — Z887 Allergy status to serum and vaccine status: Secondary | ICD-10-CM | POA: Insufficient documentation

## 2017-08-04 DIAGNOSIS — Z9981 Dependence on supplemental oxygen: Secondary | ICD-10-CM | POA: Insufficient documentation

## 2017-08-04 DIAGNOSIS — I2721 Secondary pulmonary arterial hypertension: Secondary | ICD-10-CM | POA: Insufficient documentation

## 2017-08-04 DIAGNOSIS — Z91041 Radiographic dye allergy status: Secondary | ICD-10-CM | POA: Diagnosis not present

## 2017-08-04 DIAGNOSIS — M797 Fibromyalgia: Secondary | ICD-10-CM | POA: Diagnosis not present

## 2017-08-04 DIAGNOSIS — I132 Hypertensive heart and chronic kidney disease with heart failure and with stage 5 chronic kidney disease, or end stage renal disease: Secondary | ICD-10-CM | POA: Diagnosis not present

## 2017-08-04 DIAGNOSIS — Z880 Allergy status to penicillin: Secondary | ICD-10-CM | POA: Insufficient documentation

## 2017-08-04 DIAGNOSIS — Z79899 Other long term (current) drug therapy: Secondary | ICD-10-CM | POA: Insufficient documentation

## 2017-08-04 DIAGNOSIS — E7800 Pure hypercholesterolemia, unspecified: Secondary | ICD-10-CM | POA: Diagnosis not present

## 2017-08-04 DIAGNOSIS — I509 Heart failure, unspecified: Secondary | ICD-10-CM | POA: Insufficient documentation

## 2017-08-04 DIAGNOSIS — E662 Morbid (severe) obesity with alveolar hypoventilation: Secondary | ICD-10-CM | POA: Diagnosis not present

## 2017-08-04 DIAGNOSIS — Z8249 Family history of ischemic heart disease and other diseases of the circulatory system: Secondary | ICD-10-CM | POA: Insufficient documentation

## 2017-08-04 DIAGNOSIS — Z85850 Personal history of malignant neoplasm of thyroid: Secondary | ICD-10-CM | POA: Diagnosis not present

## 2017-08-04 DIAGNOSIS — Z992 Dependence on renal dialysis: Secondary | ICD-10-CM | POA: Insufficient documentation

## 2017-08-04 DIAGNOSIS — N186 End stage renal disease: Secondary | ICD-10-CM | POA: Insufficient documentation

## 2017-08-04 HISTORY — PX: RIGHT HEART CATH: CATH118263

## 2017-08-04 SURGERY — RIGHT HEART CATH
Anesthesia: LOCAL

## 2017-08-04 MED ORDER — LIDOCAINE HCL (PF) 1 % IJ SOLN
INTRAMUSCULAR | Status: AC
  Filled 2017-08-04: qty 30

## 2017-08-04 MED ORDER — HEPARIN (PORCINE) IN NACL 2-0.9 UNIT/ML-% IJ SOLN
INTRAMUSCULAR | Status: DC | PRN
  Administered 2017-08-04: 08:00:00

## 2017-08-04 MED ORDER — HEPARIN (PORCINE) IN NACL 2-0.9 UNIT/ML-% IJ SOLN
INTRAMUSCULAR | Status: AC
  Filled 2017-08-04: qty 500

## 2017-08-04 MED ORDER — SODIUM CHLORIDE 0.9% FLUSH: 3.0000 mL | INTRAVENOUS | Status: DC | PRN

## 2017-08-04 MED ORDER — ACETAMINOPHEN 325 MG PO TABS: 650.0000 mg | ORAL_TABLET | ORAL | Status: DC | PRN

## 2017-08-04 MED ORDER — ONDANSETRON HCL 4 MG/2ML IJ SOLN: 4.0000 mg | Freq: Four times a day (QID) | INTRAMUSCULAR | Status: DC | PRN

## 2017-08-04 MED ORDER — SODIUM CHLORIDE 0.9 % IV SOLN: 250.0000 mL | INTRAVENOUS | Status: DC | PRN

## 2017-08-04 MED ORDER — SODIUM CHLORIDE 0.9% FLUSH: 3.0000 mL | Freq: Two times a day (BID) | INTRAVENOUS | Status: DC

## 2017-08-04 MED ORDER — SODIUM CHLORIDE 0.9 % IV SOLN
INTRAVENOUS | Status: DC
  Administered 2017-08-04: 07:00:00 via INTRAVENOUS

## 2017-08-04 MED ORDER — LIDOCAINE HCL (PF) 1 % IJ SOLN
INTRAMUSCULAR | Status: DC | PRN
  Administered 2017-08-04: 2 mL

## 2017-08-04 SURGICAL SUPPLY — 6 items
CATH BALLN WEDGE 5F 110CM (CATHETERS) ×2 IMPLANT
PACK CARDIAC CATHETERIZATION (CUSTOM PROCEDURE TRAY) ×2 IMPLANT
SHEATH GLIDE SLENDER 4/5FR (SHEATH) ×2 IMPLANT
TRANSDUCER W/STOPCOCK (MISCELLANEOUS) ×2 IMPLANT
TUBING ART PRESS 72  MALE/FEM (TUBING) ×1
TUBING ART PRESS 72 MALE/FEM (TUBING) ×1 IMPLANT

## 2017-08-04 NOTE — Interval H&P Note (Signed)
History and Physical Interval Note:  08/04/2017 7:33 AM  [name]  has presented today for surgery, with the diagnosis of chf  The various methods of treatment have been discussed with the patient and family. After consideration of risks, benefits and other options for treatment, the patient has consented to  Procedure(s): RIGHT HEART CATH (N/A) as a surgical intervention .  The patient's history has been reviewed, patient examined, no change in status, stable for surgery.  I have reviewed the patient's chart and labs.  Questions were answered to the patient's satisfaction.     Bensimhon, Quillian Quince

## 2017-08-04 NOTE — H&P (View-Only) (Signed)
Cardiology Office Note   Date:  07/08/2017   ID:  [name], DOB [birth year], MRN [id number]  PCP:  [name], MD  Cardiologist:   Dorris Carnes, MD   Pt referred by Dr Kae Heller for preop evaluation      History of Present Illness: [name] is a 51 y.o. female with a history of cellulitis, ESRD, DM, morbid obesity  And CHF  Echo in Feb LVEF 65 to 75%  PAP 85 mm Hg RVEF mod reduced   I remember the patient from seeing her at that time  She was admitted to the ICU with cellulitis and sepsis  On pressores at the time  Echo done as noted above  Since d/c she has followed with [facility]  CT scan without evid of chronic PE  She has been taken off of anticoagulation    She says she is feeling good  Off O2  Not needing CPAP  No CP  Breathing good   Does hemodialysis 3x per week over 4.5 hours  GOing OK   Wants to move to peritoneal for convenience  (rides)  No dizziness  No syncope        Current Meds  Medication Sig  . acetaminophen (TYLENOL) 325 MG tablet Take 650 mg by mouth every 6 (six) hours as needed for moderate pain or fever.   Marland Kitchen albuterol (PROVENTIL) (2.5 MG/3ML) 0.083% nebulizer solution Take 2.5 mg by nebulization every 6 (six) hours as needed for wheezing or shortness of breath.  . ALPRAZolam (XANAX) 0.5 MG tablet Take 0.5 mg by mouth 3 (three) times daily as needed for anxiety or sleep.   Marland Kitchen amLODipine (NORVASC) 10 MG tablet Take 0.5 tablets (5 mg total) by mouth daily.  . diphenhydrAMINE (BENADRYL) 25 mg capsule Take 25 mg by mouth every 6 (six) hours as needed for allergies.  Marland Kitchen epoetin alfa (EPOGEN,PROCRIT) 4000 UNIT/ML injection Inject 2 mLs (8,000 Units total) into the vein every Monday, Wednesday, and Friday with hemodialysis.  Marland Kitchen metoprolol (LOPRESSOR) 50 MG tablet Take 0.5 tablets (25 mg total) by mouth 2 (two) times daily.  . ondansetron (ZOFRAN) 4 MG tablet Take 1 tablet (4 mg total) by mouth every 6 (six) hours as needed for nausea.  . Oxycodone HCl  10 MG TABS Take 1 tablet (10 mg total) by mouth 4 (four) times daily as needed (pain).  Marland Kitchen sevelamer carbonate (RENVELA) 800 MG tablet Take 1,600-3,200 mg by mouth See admin instructions. Take 3200 mg by mouth 3 times daily with meals and take 1600 mg by mouth with snacks.     Allergies:   Contrast media [iodinated diagnostic agents]; Pneumococcal vaccines; Shellfish allergy; and Vancomycin   Past Medical History:  Diagnosis Date  . Anxiety   . Asthma    as a child  . Cancer ([place])    thyroid  . Cellulitis   . CHF (congestive heart failure) ([place])   . Chiari malformation    s/p surgery  . Chiari malformation   . Chronic pain   . Complication of anesthesia 11/28/15   Resp arrest after  conscious  sedation  . Constipation   . Coronary artery disease    40-50% mid LAD 04/29/09, Medical tx. (Dr. Gwenlyn Found)  . Diabetes mellitus    Type II  . Fibromyalgia   . History of blood transfusion    hemorrage duinrg pregancy  . Hx of echocardiogram 10/2011   EF 55-60%  . Hypercholesterolemia   . Hypertension   .  Lymph edema   . Obesity hypoventilation syndrome ([place])   . On home O2   . Pneumonia    in past  . Renal disorder    Pt started dialysis in Dec.2016  . S/P colonoscopy 05/26/2007   Dr. Laural Golden sigmoid diverticulosis random biopsies benign  . S/P endoscopy 05/01/2009   Dr. Penelope Coop pill-induced esophageal ulcerations distal to midesophagus, 2 small ulcers in the antrum of the stomach  . Shortness of breath dyspnea    with any exertion or if heart rate  is irregular while on dialysis  . Sleep apnea     Past Surgical History:  Procedure Laterality Date  . Marland KitchenHemodialysis catheter Right 11/28/2015  . ABDOMINAL HYSTERECTOMY    . ADENOIDECTOMY    . AV FISTULA PLACEMENT Left 03/15/2016   Procedure: CREATION OF LEFT ARM ARTERIOVENOUS (AV) FISTULA  ;  Surgeon: Rosetta Posner, MD;  Location: [facility];  Service: Vascular;  Laterality: Left;  . CESAREAN SECTION      x 2  . CRANIECTOMY SUBOCCIPITAL  W/ CERVICAL LAMINECTOMY / CHIARI    . FISTULA SUPERFICIALIZATION Left 05/10/2016   Procedure: Left Arm FISTULA SUPERFICIALIZATION;  Surgeon: Rosetta Posner, MD;  [facility];  Service: Vascular;  Laterality: Left;  . IR GENERIC HISTORICAL  08/14/2016   IR REMOVAL TUN ACCESS W/ PORT W/O FL MOD SED 08/14/2016 Arne Cleveland, MD [facility]-INTER[facility] RAD  . PERIPHERAL VASCULAR CATHETERIZATION Left 11/25/2016   Procedure: A/V Fistulagram;  Surgeon: Conrad [place], MD;  Location: [facility];  Service: Cardiovascular;  Laterality: Left;  arm  . PORTACATH PLACEMENT  07/05/2012   Procedure: INSERTION PORT-A-CATH;  Surgeon: Donato Heinz, MD;  Location: [facility];  Service: General;  Laterality: Left;  subclavian  . THYROIDECTOMY, PARTIAL    . TONSILLECTOMY       Social History:  The patient  reports that she has never smoked. She has never used smokeless tobacco. She reports that she does not drink alcohol or use drugs.   Family History:  The patient's family history includes Colon cancer (age of onset: 59) in her mother; Coronary artery disease in her mother; Stroke (age of onset: 41) in her mother.    ROS:  Please see the history of present illness. All other systems are reviewed and  Negative to the above problem except as noted.    PHYSICAL EXAM: VS:  BP 139/84   Pulse 88   Ht _0  (1.651 m)   Wt (!) 307 lb 12.8 oz (139.6 kg)   SpO2 96% Comment: on room air  BMI 51.22 kg/m   GEN: Morbidly obese 51 yo , in no acute distress  HEENT: normal  Neck: no JVD, carotid bruits, or masses Cardiac: RRR; no murmurs, rubs, or gallops,Tr edema  Respiratory:  clear to auscultation bilaterally, normal work of breathing GI: soft, nontender, obese  , + BS  No hepatomegaly  MS: no deformity Moving all extremities   Skin: warm and dry, no rash Neuro:  Strength and sensation are intact Psych: euthymic mood, full affect   EKG:  EKG is not  ordered today.  From 01/20/17  SR 96 bpm  Poss IWMI  Prolonged QT   Nonspecific ST changes     Lipid Panel    Component Value Date/Time   CHOL  04/29/2009 0630    167        ATP III CLASSIFICATION:  <200     mg/dL   Desirable  200-239  mg/dL   Borderline High  >=240    mg/dL   High          TRIG 220 (H) 04/29/2009 0630   HDL 37 (L) 04/29/2009 0630   CHOLHDL 4.5 04/29/2009 0630   VLDL 44 (H) 04/29/2009 0630   LDLCALC  04/29/2009 0630    86        Total Cholesterol/HDL:CHD Risk Coronary Heart Disease Risk Table                     Men   Women  1/2 Average Risk   3.4   3.3  Average Risk       5.0   4.4  2 X Average Risk   9.6   7.1  3 X Average Risk  23.4   11.0        Use the calculated Patient Ratio above and the CHD Risk Table to determine the patient's CHD Risk.        ATP III CLASSIFICATION (LDL):  <100     mg/dL   Optimal  100-129  mg/dL   Near or Above                    Optimal  130-159  mg/dL   Borderline  160-189  mg/dL   High  >190     mg/dL   Very High      Wt Readings from Last 3 Encounters:  07/08/17 (!) 307 lb 12.8 oz (139.6 kg)  02/01/17 (!) 327 lb 6.1 oz (148.5 kg)  01/06/17 (!) 304 lb 3.8 oz (138 kg)      ASSESSMENT AND PLAN:  Renea Ee is a 51 yo with history of severe pulmonary HTN and RV dysfunction by echo thsi winter.  LV function is normal The patient is feeling much better than when I saw her last winter.  Off O2  Continues hemodialysis which she has tolerated O2   Being eval for placement of catheter so she can start peritoneal dialysis  To further stratify for surgical risks I would recomm repeat echo to redefine RV function and pulmnary pressures Volume status at present appears OK    2  HTN  BP is OK   Contine meds  3 ESRD  Dialysis MWF.  Further f/u based on test results     Current medicines are reviewed at length with the patient today.  The patient does not have concerns regarding medicines.  Signed, Dorris Carnes, MD  07/08/2017 8:34 AM    [address],  [place], [place]  [address] Phone: [phone]; Fax: [phone]

## 2017-08-04 NOTE — Discharge Instructions (Signed)
Angiogram, Care After This sheet gives you information about how to care for yourself after your procedure. Your doctor may also give you more specific instructions. If you have problems or questions, contact your doctor. Follow these instructions at home: Insertion site care  Do not take baths, swim, or use a hot tub until your doctor says it is okay.  You may shower 24-48 hours after the procedure or as told by your doctor. ? Gently wash the area with plain soap and water. ? Pat the area dry with a clean towel. ? Do not rub the area. This may cause bleeding.  Do not apply powder or lotion to the area. Keep the area clean and dry.  Check your insertion area every day for signs of infection. Check for: ? More redness, swelling, or pain. ? Fluid or blood. ? Warmth. ? Pus or a bad smell. Activity  Rest as told by your doctor, usually for 1-2 days.  Do not lift anything that is heavier than 10 lbs. (4.5 kg) or as told by your doctor.  Do not drive for 24 hours if you were given a medicine to help you relax (sedative).  Do not drive or use heavy machinery while taking prescription pain medicine. General instructions  Go back to your normal activities as told by your doctor, usually in about a week. Ask your doctor what activities are safe for you.  If the insertion area starts to bleed, lie flat and put pressure on the area. If the bleeding does not stop, get help right away. This is an emergency.  Drink enough fluid to keep your pee (urine) clear or pale yellow.  Take over-the-counter and prescription medicines only as told by your doctor.  Keep all follow-up visits as told by your doctor. This is important. Contact a doctor if:  You have a fever.  You have chills.  You have more redness, swelling, or pain around your insertion area.  You have fluid or blood coming from your insertion area.  The insertion area feels warm to the touch.  You have pus or a bad smell  coming from your insertion area.  You have more bruising around the insertion area.  Blood collects in the tissue around the insertion area (hematoma) that may be painful to the touch. Get help right away if:  You have a lot of pain in the insertion area.  The insertion area swells very fast.  The insertion area is bleeding, and the bleeding does not stop after holding steady pressure on the area.  The area near or just beyond the insertion area becomes pale, cool, tingly, or numb. These symptoms may be an emergency. Do not wait to see if the symptoms will go away. Get medical help right away. Call your local emergency services (911 in the [place] Do not drive yourself to the hospital. Summary  After the procedure, it is common to have bruising and tenderness at the long, thin tube insertion area.  After the procedure, it is important to rest and drink plenty of fluids.  Do not take baths, swim, or use a hot tub until your doctor says it is okay to do so. You may shower 24-48 hours after the procedure or as told by your doctor.  If the insertion area starts to bleed, lie flat and put pressure on the area. If the bleeding does not stop, get help right away. This is an emergency. This information is not intended to replace advice given  to you by your health care provider. Make sure you discuss any questions you have with your health care provider. Document Released: 03/04/2009 Document Revised: 11/30/2016 Document Reviewed: 11/30/2016 Elsevier Interactive Patient Education  2017 [employer].

## 2017-08-05 DIAGNOSIS — E8770 Fluid overload, unspecified: Secondary | ICD-10-CM | POA: Diagnosis not present

## 2017-08-05 DIAGNOSIS — D509 Iron deficiency anemia, unspecified: Secondary | ICD-10-CM | POA: Diagnosis not present

## 2017-08-05 DIAGNOSIS — N2581 Secondary hyperparathyroidism of renal origin: Secondary | ICD-10-CM | POA: Diagnosis not present

## 2017-08-05 DIAGNOSIS — N186 End stage renal disease: Secondary | ICD-10-CM | POA: Diagnosis not present

## 2017-08-05 DIAGNOSIS — D631 Anemia in chronic kidney disease: Secondary | ICD-10-CM | POA: Diagnosis not present

## 2017-08-05 DIAGNOSIS — Z992 Dependence on renal dialysis: Secondary | ICD-10-CM | POA: Diagnosis not present

## 2017-08-05 LAB — POCT I-STAT 3, VENOUS BLOOD GAS (G3P V)
ACID-BASE EXCESS: 3 mmol/L — AB (ref 0.0–2.0)
Acid-Base Excess: 4 mmol/L — ABNORMAL HIGH (ref 0.0–2.0)
Acid-Base Excess: 4 mmol/L — ABNORMAL HIGH (ref 0.0–2.0)
BICARBONATE: 28.5 mmol/L — AB (ref 20.0–28.0)
Bicarbonate: 28.6 mmol/L — ABNORMAL HIGH (ref 20.0–28.0)
Bicarbonate: 29.2 mmol/L — ABNORMAL HIGH (ref 20.0–28.0)
O2 SAT: 71 %
O2 Saturation: 75 %
O2 Saturation: 75 %
PCO2 VEN: 44.3 mmHg (ref 44.0–60.0)
PCO2 VEN: 48.1 mmHg (ref 44.0–60.0)
PH VEN: 7.418 (ref 7.250–7.430)
PO2 VEN: 40 mmHg (ref 32.0–45.0)
PO2 VEN: 40 mmHg (ref 32.0–45.0)
TCO2: 30 mmol/L (ref 0–100)
TCO2: 31 mmol/L (ref 0–100)
TCO2: 30 mmol/L (ref 0–100)
pCO2, Ven: 44.5 mmHg (ref 44.0–60.0)
pH, Ven: 7.391 (ref 7.250–7.430)
pH, Ven: 7.414 (ref 7.250–7.430)
pO2, Ven: 38 mmHg (ref 32.0–45.0)

## 2017-08-08 DIAGNOSIS — D509 Iron deficiency anemia, unspecified: Secondary | ICD-10-CM | POA: Diagnosis not present

## 2017-08-08 DIAGNOSIS — N2581 Secondary hyperparathyroidism of renal origin: Secondary | ICD-10-CM | POA: Diagnosis not present

## 2017-08-08 DIAGNOSIS — D631 Anemia in chronic kidney disease: Secondary | ICD-10-CM | POA: Diagnosis not present

## 2017-08-08 DIAGNOSIS — E8770 Fluid overload, unspecified: Secondary | ICD-10-CM | POA: Diagnosis not present

## 2017-08-08 DIAGNOSIS — Z992 Dependence on renal dialysis: Secondary | ICD-10-CM | POA: Diagnosis not present

## 2017-08-08 DIAGNOSIS — N186 End stage renal disease: Secondary | ICD-10-CM | POA: Diagnosis not present

## 2017-08-10 DIAGNOSIS — D509 Iron deficiency anemia, unspecified: Secondary | ICD-10-CM | POA: Diagnosis not present

## 2017-08-10 DIAGNOSIS — E8770 Fluid overload, unspecified: Secondary | ICD-10-CM | POA: Diagnosis not present

## 2017-08-10 DIAGNOSIS — D631 Anemia in chronic kidney disease: Secondary | ICD-10-CM | POA: Diagnosis not present

## 2017-08-10 DIAGNOSIS — N2581 Secondary hyperparathyroidism of renal origin: Secondary | ICD-10-CM | POA: Diagnosis not present

## 2017-08-10 DIAGNOSIS — N186 End stage renal disease: Secondary | ICD-10-CM | POA: Diagnosis not present

## 2017-08-10 DIAGNOSIS — Z992 Dependence on renal dialysis: Secondary | ICD-10-CM | POA: Diagnosis not present

## 2017-08-11 ENCOUNTER — Ambulatory Visit: Payer: Self-pay | Admitting: Surgery

## 2017-08-11 NOTE — H&P (Signed)
[name] Patient #: [id number] DOB: 03-12-1966 Married / Language: English / Race: Black or African American Female   History of Present Illness Patient words: 51yo woman with multiple severe comorbidities including but not limited to CHF (right sided HF, pulmonary hypertension with peak pulm pressure 19mHg on last echo), CAD, diabetes, morbid obesity with lymphedema and hypoventilation, diabetes who desires peritoneal dialysis catheter insertion for home PD. She has been on dialysis for about a year and a half. Currently uses left forearm avf. She desires PD due to the convenience of doing this at home. She and her husband are both disabled and transportation is an issue with her. She also wants the time to visit her family instead of sitting in the dialysis unit. She has friends and acquaintances on PD and has heard nothing but good things about it. She has also done a good amount of research in addition to speaking with the dialysis nurse. Her only abdominal surgical history includes 2 C-sections and a hysterectomy all via Pfannenstiel. She does not use any blood thinners.  The patient is a 51year old female.   Past Surgical History  Cesarean Section - Multiple  Dialysis Shunt / Fistula  Hysterectomy (not due to cancer) - Partial  Oral Surgery  Spinal Surgery - Neck  Thyroid Surgery   Diagnostic Studies History Colonoscopy  never Mammogram  never Pap Smear  >5 years ago  Allergies  VANCOMYCIN  Rash, Swelling, Vomiting.  Medication History  ALPRAZolam (0.5MG Tablet, Oral) Active. AmLODIPine Besylate (10MG Tablet, Oral) Active. Benadryl Allergy (25MG Capsule, Oral) Active. Benadryl Allergy (25MG Tablet, Oral) Active. NovoLOG (100UNIT/ML Solution, Subcutaneous) Active. Lantus (100UNIT/ML Solution, Subcutaneous) Active. Rena-Vite (Oral) Active. Reglan (5MG Tablet, Oral) Active. Metoprolol Tartrate (50MG Tablet, Oral) Active. Medications  Reconciled  Social History  Alcohol use  Remotely quit alcohol use. Caffeine use  Coffee. Tobacco use  Never smoker.  Family History  Alcohol Abuse  Family Members In General. Anesthetic complications  Family Members In General. Arthritis  Family Members In General. Bleeding disorder  Sister. Breast Cancer  Family Members In General. Cancer  Family Members In General. Cerebrovascular Accident  Mother, Sister. Cervical Cancer  Family Members In General. Colon Cancer  Family Members In General. Colon Polyps  Family Members In General. Depression  Family Members In General. Diabetes Mellitus  Sister. Heart Disease  Sister. Hypertension  Sister. Ischemic Bowel Disease  Family Members In General. Kidney Disease  Family Members In General. Malignant Neoplasm Of Pancreas  Family Members In General. Melanoma  Family Members In General. Migraine Headache  Family Members In General. Ovarian Cancer  Family Members In General. Prostate Cancer  Family Members In General. Rectal Cancer  Family Members In General. Respiratory Condition  Sister. Seizure disorder  Family Members In General. Thyroid problems  Family Members In General.  Pregnancy / Birth History  Age at menarche  89years. Contraceptive History  Oral contraceptives. Gravida  4 Irregular periods  Length (months) of breastfeeding  7-12 Maternal age  746-20Para  3  Other Problems  Asthma  Chronic Obstructive Lung Disease  Chronic Renal Failure Syndrome  Congestive Heart Failure  Diabetes Mellitus  General anesthesia - complications  High blood pressure  Home Oxygen Use  Oophorectomy  Left. Thyroid Disease     Review of Systems  General Present- Fatigue. Not Present- Appetite Loss, Chills, Fever, Night Sweats, Weight Gain and Weight Loss. Skin Present- Dryness. Not Present- Change in Wart/Mole, Hives, Jaundice, New Lesions, Non-Healing Wounds,  Rash and Ulcer. HEENT  Present- Seasonal Allergies. Not Present- Earache, Hearing Loss, Hoarseness, Nose Bleed, Oral Ulcers, Ringing in the Ears, Sinus Pain, Sore Throat, Visual Disturbances, Wears glasses/contact lenses and Yellow Eyes. Cardiovascular Present- Swelling of Extremities. Not Present- Chest Pain, Difficulty Breathing Lying Down, Leg Cramps, Palpitations, Rapid Heart Rate and Shortness of Breath. Gastrointestinal Not Present- Abdominal Pain, Bloating, Bloody Stool, Change in Bowel Habits, Chronic diarrhea, Constipation, Difficulty Swallowing, Excessive gas, Gets full quickly at meals, Hemorrhoids, Indigestion, Nausea, Rectal Pain and Vomiting. Musculoskeletal Present- Joint Stiffness, Muscle Pain and Swelling of Extremities. Not Present- Back Pain, Joint Pain and Muscle Weakness. Neurological Present- Trouble walking. Not Present- Decreased Memory, Fainting, Headaches, Numbness, Seizures, Tingling, Tremor and Weakness. Psychiatric Not Present- Anxiety, Bipolar, Change in Sleep Pattern, Depression, Fearful and Frequent crying. Hematology Present- Blood Thinners. Not Present- Easy Bruising, Excessive bleeding, Gland problems, HIV and Persistent Infections. All other systems negative  Vitals  Weight: 138.25 lb Height: 65in Body Surface Area: 1.69 m Body Mass Index: 23.01 kg/m  Temp.: 97.66F  Pulse: 95 (Regular)  P.OX: 95% (Room air) BP: 156/90 (Sitting, Left Arm, Standard)       Physical Exam The physical exam findings are as follows: Note:Gen: A&Ox3, no distress. Morbidly obese woman in a wheelchair Head: normocephalic, atraumatic, EOMI, anicteric. Neck: supple without mass or abnormality Chest: unlabored respirations. Currently on room air. Cardiovascular: RRR. Thrill in left forearm AV fistula Abdomen: obese, nontender nondistended. No palpable abnormalities Extremities: bilateral lower extremity lymphedema Neuro: grossly intact Psych: appropriate mood and affect Skin: no lesions  or rashes on limited exam    Assessment & Plan  END-STAGE RENAL DISEASE ON HEMODIALYSIS (N18.13) Story: 51 year old woman with multiple severe comorbidities, desires peritoneal dialysis catheter. I discussed with her the nature of the surgery including the necessity of general anesthesia and abdominal insufflation. We discussed the risks of surgery including bleeding, infection, pain, scarring, intra-abdominal injury; as well as complications of the peritoneal dialysis catheter including occlusion, need for repositioning or removal etc. I emphasized to her that she is at high risk for general anesthesia because of her comorbidities specifically her pulmonary hypertension and that I would like her to get cardiac clearance prior to proceeding with general anesthesia. She expressed understanding and all her questions were answered. She is eager to move forward. We will refer her to see a cardiologist to assess her safety for general anesthesia prior to scheduling. DIABETES (E11.9) MORBID OBESITY (E66.01) RIGHT-SIDED CONGESTIVE HEART FAILURE (I50.810) PULMONARY HYPERTENSION (I27.20) SLEEP APNEA (G47.30) LYMPHEDEMA (I89.0) HYPERTENSION (I10) CORONARY ARTERY DISEASE (I25.10) CHRONIC PAIN (G89.29)    She has been evaluated by Dr. Harrington Challenger of cardiology and has undergone catheterization.  She has significant pulmonary hypertension with pas in the mid 70s. Dr. Harrington Challenger recommends that she can undergo surgery with careful invasive Intra-Op monitoring by anesthesia (she did discuss this case with Dr. Linna Caprice too), and does recommend that we proceed as hemodialysis will become increasingly difficult for the patient due to her heart problems.

## 2017-08-12 DIAGNOSIS — Z992 Dependence on renal dialysis: Secondary | ICD-10-CM | POA: Diagnosis not present

## 2017-08-12 DIAGNOSIS — E8770 Fluid overload, unspecified: Secondary | ICD-10-CM | POA: Diagnosis not present

## 2017-08-12 DIAGNOSIS — N2581 Secondary hyperparathyroidism of renal origin: Secondary | ICD-10-CM | POA: Diagnosis not present

## 2017-08-12 DIAGNOSIS — D509 Iron deficiency anemia, unspecified: Secondary | ICD-10-CM | POA: Diagnosis not present

## 2017-08-12 DIAGNOSIS — N186 End stage renal disease: Secondary | ICD-10-CM | POA: Diagnosis not present

## 2017-08-12 DIAGNOSIS — D631 Anemia in chronic kidney disease: Secondary | ICD-10-CM | POA: Diagnosis not present

## 2017-08-15 DIAGNOSIS — N186 End stage renal disease: Secondary | ICD-10-CM | POA: Diagnosis not present

## 2017-08-15 DIAGNOSIS — E8770 Fluid overload, unspecified: Secondary | ICD-10-CM | POA: Diagnosis not present

## 2017-08-15 DIAGNOSIS — N2581 Secondary hyperparathyroidism of renal origin: Secondary | ICD-10-CM | POA: Diagnosis not present

## 2017-08-15 DIAGNOSIS — D631 Anemia in chronic kidney disease: Secondary | ICD-10-CM | POA: Diagnosis not present

## 2017-08-15 DIAGNOSIS — Z992 Dependence on renal dialysis: Secondary | ICD-10-CM | POA: Diagnosis not present

## 2017-08-15 DIAGNOSIS — D509 Iron deficiency anemia, unspecified: Secondary | ICD-10-CM | POA: Diagnosis not present

## 2017-08-17 DIAGNOSIS — N2581 Secondary hyperparathyroidism of renal origin: Secondary | ICD-10-CM | POA: Diagnosis not present

## 2017-08-17 DIAGNOSIS — D631 Anemia in chronic kidney disease: Secondary | ICD-10-CM | POA: Diagnosis not present

## 2017-08-17 DIAGNOSIS — Z992 Dependence on renal dialysis: Secondary | ICD-10-CM | POA: Diagnosis not present

## 2017-08-17 DIAGNOSIS — E8770 Fluid overload, unspecified: Secondary | ICD-10-CM | POA: Diagnosis not present

## 2017-08-17 DIAGNOSIS — D509 Iron deficiency anemia, unspecified: Secondary | ICD-10-CM | POA: Diagnosis not present

## 2017-08-17 DIAGNOSIS — N186 End stage renal disease: Secondary | ICD-10-CM | POA: Diagnosis not present

## 2017-08-19 DIAGNOSIS — N2581 Secondary hyperparathyroidism of renal origin: Secondary | ICD-10-CM | POA: Diagnosis not present

## 2017-08-19 DIAGNOSIS — D631 Anemia in chronic kidney disease: Secondary | ICD-10-CM | POA: Diagnosis not present

## 2017-08-19 DIAGNOSIS — D509 Iron deficiency anemia, unspecified: Secondary | ICD-10-CM | POA: Diagnosis not present

## 2017-08-19 DIAGNOSIS — N186 End stage renal disease: Secondary | ICD-10-CM | POA: Diagnosis not present

## 2017-08-19 DIAGNOSIS — Z992 Dependence on renal dialysis: Secondary | ICD-10-CM | POA: Diagnosis not present

## 2017-08-19 DIAGNOSIS — E8770 Fluid overload, unspecified: Secondary | ICD-10-CM | POA: Diagnosis not present

## 2017-08-22 DIAGNOSIS — Z992 Dependence on renal dialysis: Secondary | ICD-10-CM | POA: Diagnosis not present

## 2017-08-22 DIAGNOSIS — D509 Iron deficiency anemia, unspecified: Secondary | ICD-10-CM | POA: Diagnosis not present

## 2017-08-22 DIAGNOSIS — D631 Anemia in chronic kidney disease: Secondary | ICD-10-CM | POA: Diagnosis not present

## 2017-08-22 DIAGNOSIS — N2581 Secondary hyperparathyroidism of renal origin: Secondary | ICD-10-CM | POA: Diagnosis not present

## 2017-08-22 DIAGNOSIS — N186 End stage renal disease: Secondary | ICD-10-CM | POA: Diagnosis not present

## 2017-08-22 DIAGNOSIS — Z23 Encounter for immunization: Secondary | ICD-10-CM | POA: Diagnosis not present

## 2017-08-24 DIAGNOSIS — Z23 Encounter for immunization: Secondary | ICD-10-CM | POA: Diagnosis not present

## 2017-08-24 DIAGNOSIS — N2581 Secondary hyperparathyroidism of renal origin: Secondary | ICD-10-CM | POA: Diagnosis not present

## 2017-08-24 DIAGNOSIS — D631 Anemia in chronic kidney disease: Secondary | ICD-10-CM | POA: Diagnosis not present

## 2017-08-24 DIAGNOSIS — N186 End stage renal disease: Secondary | ICD-10-CM | POA: Diagnosis not present

## 2017-08-24 DIAGNOSIS — D509 Iron deficiency anemia, unspecified: Secondary | ICD-10-CM | POA: Diagnosis not present

## 2017-08-24 DIAGNOSIS — Z992 Dependence on renal dialysis: Secondary | ICD-10-CM | POA: Diagnosis not present

## 2017-08-26 DIAGNOSIS — N186 End stage renal disease: Secondary | ICD-10-CM | POA: Diagnosis not present

## 2017-08-26 DIAGNOSIS — D509 Iron deficiency anemia, unspecified: Secondary | ICD-10-CM | POA: Diagnosis not present

## 2017-08-26 DIAGNOSIS — Z23 Encounter for immunization: Secondary | ICD-10-CM | POA: Diagnosis not present

## 2017-08-26 DIAGNOSIS — Z992 Dependence on renal dialysis: Secondary | ICD-10-CM | POA: Diagnosis not present

## 2017-08-26 DIAGNOSIS — D631 Anemia in chronic kidney disease: Secondary | ICD-10-CM | POA: Diagnosis not present

## 2017-08-26 DIAGNOSIS — N2581 Secondary hyperparathyroidism of renal origin: Secondary | ICD-10-CM | POA: Diagnosis not present

## 2017-08-27 ENCOUNTER — Encounter (HOSPITAL_COMMUNITY): Payer: Self-pay | Admitting: *Deleted

## 2017-08-29 ENCOUNTER — Encounter (HOSPITAL_COMMUNITY): Payer: Self-pay | Admitting: Vascular Surgery

## 2017-08-29 DIAGNOSIS — N186 End stage renal disease: Secondary | ICD-10-CM | POA: Diagnosis not present

## 2017-08-29 DIAGNOSIS — D631 Anemia in chronic kidney disease: Secondary | ICD-10-CM | POA: Diagnosis not present

## 2017-08-29 DIAGNOSIS — D509 Iron deficiency anemia, unspecified: Secondary | ICD-10-CM | POA: Diagnosis not present

## 2017-08-29 DIAGNOSIS — Z23 Encounter for immunization: Secondary | ICD-10-CM | POA: Diagnosis not present

## 2017-08-29 DIAGNOSIS — Z992 Dependence on renal dialysis: Secondary | ICD-10-CM | POA: Diagnosis not present

## 2017-08-29 DIAGNOSIS — N2581 Secondary hyperparathyroidism of renal origin: Secondary | ICD-10-CM | POA: Diagnosis not present

## 2017-08-29 MED ORDER — DEXTROSE 5 % IV SOLN
3.0000 g | INTRAVENOUS | Status: AC
  Administered 2017-08-30: 3 g via INTRAVENOUS
  Filled 2017-08-29: qty 3

## 2017-08-29 NOTE — Anesthesia Preprocedure Evaluation (Addendum)
Anesthesia Evaluation  Patient identified by MRN, date of birth, ID band Patient awake    Reviewed: Allergy & Precautions, NPO status , Patient's Chart, lab work & pertinent test results  Airway Mallampati: II  TM Distance: >3 FB Neck ROM: Full    Dental  (+) Dental Advisory Given   Pulmonary asthma , sleep apnea ,    breath sounds clear to auscultation       Cardiovascular hypertension, Pt. on medications + CAD and +CHF   Rhythm:Regular Rate:Normal  NL LVEF. Severe pulmonary hypertension   Neuro/Psych Anxiety negative neurological ROS     GI/Hepatic negative GI ROS, Neg liver ROS,   Endo/Other  diabetes, Type 2  Renal/GU ESRF and DialysisRenal disease     Musculoskeletal   Abdominal   Peds  Hematology  (+) anemia ,   Anesthesia Other Findings   Reproductive/Obstetrics                            Lab Results  Component Value Date   WBC 8.7 07/26/2017   HGB 12.9 07/26/2017   HCT 38.0 07/26/2017   MCV 91.8 07/26/2017   PLT 199 07/26/2017   Lab Results  Component Value Date   CREATININE 4.80 (H) 07/26/2017   BUN 37 (H) 07/26/2017   NA 138 07/26/2017   K 4.2 07/26/2017   CL 104 07/26/2017   CO2 27 07/26/2017    Anesthesia Physical Anesthesia Plan  ASA: III  Anesthesia Plan: General   Post-op Pain Management:    Induction: Intravenous  PONV Risk Score and Plan: 4 or greater and Ondansetron, Dexamethasone, Scopolamine patch - Pre-op and Treatment may vary due to age or medical condition  Airway Management Planned: Oral ETT  Additional Equipment: Arterial line  Intra-op Plan:   Post-operative Plan: Extubation in OR  Informed Consent: I have reviewed the patients History and Physical, chart, labs and discussed the procedure including the risks, benefits and alternatives for the proposed anesthesia with the patient or authorized representative who has indicated his/her  understanding and acceptance.   Dental advisory given  Plan Discussed with: CRNA  Anesthesia Plan Comments:        Anesthesia Quick Evaluation

## 2017-08-29 NOTE — Progress Notes (Signed)
Anesthesia Chart Review: SAME DAY WORK-UP.  Patient is a 51 year old female scheduled for laparoscopic insertion continuous ambulatory PD catheter on 08/30/17 by Dr. Romana Juniper.  History includes never smoker, morbid obesity, pulmonary hypertension, ESRD on hemodialysis, HTN, hypercholesterolemia, Chiari malformation s/p craniectomy suboccipital with cervical laminectomy, fibromyalgia, OSA with obesity hypoventilation syndrome (no CPAP since weight loss), asthma, DM2 (diet controlled), CAD (40-50% LAD '10), CHF, anxiety, lymphedema, thyroid cancer s/p partial thyroidectomy, T&A '04, hysterectomy, left [place] Port-a-cath 07/05/12 (for chronic thrombophlebitis, failed PICC line attempts), left arm AVF creation 03/15/16 with superficialization 05/10/16.  For anesthesia complications, she had respiratory arrest in IR after receiving 49m Versed and 100 umg Fentanyl for conscious sedation in preparation of placement of a permanent hemodialysis catheter. Respiratory arrest was recognized immediately, and she received Narcan and romazicon to reverse her. Face shield O2 and two rounds of chest compression for a weak tready radial pulse. She slowly aroused and awakened after approximately 2 minutes. The procedure was aborted, and she was transferred to the ICU for observation. She ultimately underwent placement of a right IJ tunneled hemodialysis catheter in IR on 12/09/15 without local lidocaine and no IV sedation.    PCP is Dr. ESinda Du  Nephrologist is Dr. BLowanda Foster  Cardiologist is Dr. PDorris Carnes last visit 07/08/17. Notes indicate patient is off O2. She recommended repeat echo prior to surgery to redefine RV function and pulmonary pressures prior to surgery. Patient ended up having a RHC on 08/04/17 (see below). Dr. RHarrington Challengeradded Addendum: "PAH likely multifactorial given high cardiac output and relatively normal PVR suspect majority of PEnterprisemay be due to presence of AV fistula. There is no evidence of  intra-cardiac shunting.  No role for selective pulmonary vasodilators at this time I have reviewed findings with Drs. CDorris Fetchand Hawkins/ Pt is acceptable to proceed with placement of peritoneal dialysis catheter with close f/u by anesthesia throughout, with invasive monitoring   She will do better in long run with peritoneal dialysis from hemodynamic standpoint."  Meds include albuterol, Xanax, Norvasc, Epogen, Lopressor, Zofran, oxycodone, Renvela.   EKG 08/04/16: SR with PACs, infarct infarct (age undetermined).   [place]/16/18: Findings: RA = 11 RV = 76/13 PA = 76/24 (45) PCW = 19 Fick cardiac output/index = 8.7/3.7 PVR = 3.0 WU Ao sat = 99% PA sat = 75%, 75% High SVC sat = 71% Assessment: 1. Moderate PAH with high cardiac output 2. Relative well compensated left-sided pressures in setting of HD Plan/Discussion: PAH likely multifactorial given high cardiac output and relatively normal PVR suspect majority of PAH may be due to presence of AV fistula. There is no evidence of intra-cardiac shunting.  No role for selective pulmonary vasodilators at this time  Echo 07/12/17: Study Conclusions - Left ventricle: The cavity size was normal. Wall thickness was   increased in a pattern of moderate LVH. Systolic function was   normal. The estimated ejection fraction was in the range of 60%   to 65%. Wall motion was normal; there were no regional wall   motion abnormalities. - Aortic valve: Mildly calcified annulus. Trileaflet; mildly   thickened leaflets. Valve area (VTI): 2.47 cm^2. Valve area   (Vmax): 2.07 cm^2. Valve area (Vmean): 2.29 cm^2. - Mitral valve: Mildly calcified annulus. Mildly thickened leaflets  . - Right ventricle: The cavity size was moderately to severely   dilated. - Right atrium: The atrial septum bulges to the left consistent   with elevated RA pressure. The atrium was  mildly dilated. - Atrial septum: No defect or patent foramen ovale was  identified. - Tricuspid valve: There was moderate regurgitation. - Pulmonary arteries: Systolic pressure was severely increased. PA   peak pressure: 78 mm Hg (S). - Inferior vena cava: The vessel was dilated. The respirophasic   diameter changes were blunted (< 50%), consistent with elevated   central venous pressure. - Technically adequate study.  Cardiac Cath5/11/10 (Dr. Quay Burow): SELECTIVE CORONARY ANGIOGRAPHY: 1. Left main normal. 2. LAD; the LAD had a 40-50% segmental stenosis in the mid portion  after the second small diagonal branch. 3. Circumflex; dominant and free of significant disease. 4. Right coronary artery; nondominant and free of significant disease. 5. Left ventriculography; RAO left ventriculogram was performed using  25 mL of Visipaque dye at 12 mL per second. The overall LVEF was  estimated greater than 60% without focal wall motion abnormalities. IMPRESSION: Ms. Scalia has mild-to-moderate segmental mid left anterior descending disease which appears to be fairly similar by description to her anatomy 3 years ago. At this point, we will continue with medical therapy, potentially obtain an outpatient stress test on her. If she continues to have chest pain, she may need intracoronary vascular ultrasound-guided intervention.  1V CXR 01/25/17: IMPRESSION: Cardiomegaly with vascular congestion and interstitial prominence, likely mild interstitial edema.  Spirometry 12/05/15: FVC 1.12 (37%), FEV1 0.96 (39%), FEF25-75% 0.97 (38%). Spirometry shows severe ventilatory defect with small airway airflow obstruction.   She is for labs on arrival.   Patient with multiple co-morbidities as listed above. She had recent cardiology evaluation with clearance following RHC. By notes, Dr. Harrington Challenger discussed with surgeon and anesthesiologist Dr. Linna Caprice. She will need invasive monitoring.  George Hugh [facility]/Anesthesiology Phone [phone] 08/29/2017 11:28 AM

## 2017-08-30 ENCOUNTER — Ambulatory Visit (HOSPITAL_COMMUNITY): Payer: Medicare Other | Admitting: Vascular Surgery

## 2017-08-30 ENCOUNTER — Encounter (HOSPITAL_COMMUNITY): Payer: Self-pay | Admitting: *Deleted

## 2017-08-30 ENCOUNTER — Ambulatory Visit (HOSPITAL_COMMUNITY)
Admission: RE | Admit: 2017-08-30 | Discharge: 2017-08-30 | Disposition: A | Discharge status: Home / Self Care | Payer: Medicare Other | Source: Ambulatory Visit | Attending: Surgery | Admitting: Surgery

## 2017-08-30 ENCOUNTER — Ambulatory Visit (HOSPITAL_COMMUNITY): Payer: Medicare Other

## 2017-08-30 ENCOUNTER — Encounter (HOSPITAL_COMMUNITY)
Admission: RE | Disposition: A | Discharge status: Home / Self Care | Payer: Self-pay | Source: Ambulatory Visit | Attending: Surgery

## 2017-08-30 DIAGNOSIS — I272 Pulmonary hypertension, unspecified: Secondary | ICD-10-CM | POA: Diagnosis not present

## 2017-08-30 DIAGNOSIS — I509 Heart failure, unspecified: Secondary | ICD-10-CM | POA: Diagnosis not present

## 2017-08-30 DIAGNOSIS — Z6841 Body Mass Index (BMI) 40.0 and over, adult: Secondary | ICD-10-CM | POA: Insufficient documentation

## 2017-08-30 DIAGNOSIS — G4730 Sleep apnea, unspecified: Secondary | ICD-10-CM | POA: Diagnosis not present

## 2017-08-30 DIAGNOSIS — E1122 Type 2 diabetes mellitus with diabetic chronic kidney disease: Secondary | ICD-10-CM | POA: Diagnosis not present

## 2017-08-30 DIAGNOSIS — Z794 Long term (current) use of insulin: Secondary | ICD-10-CM | POA: Diagnosis not present

## 2017-08-30 DIAGNOSIS — I2510 Atherosclerotic heart disease of native coronary artery without angina pectoris: Secondary | ICD-10-CM | POA: Insufficient documentation

## 2017-08-30 DIAGNOSIS — Z9981 Dependence on supplemental oxygen: Secondary | ICD-10-CM | POA: Insufficient documentation

## 2017-08-30 DIAGNOSIS — Z79899 Other long term (current) drug therapy: Secondary | ICD-10-CM | POA: Insufficient documentation

## 2017-08-30 DIAGNOSIS — N186 End stage renal disease: Secondary | ICD-10-CM | POA: Diagnosis not present

## 2017-08-30 DIAGNOSIS — Z992 Dependence on renal dialysis: Secondary | ICD-10-CM | POA: Diagnosis not present

## 2017-08-30 DIAGNOSIS — I132 Hypertensive heart and chronic kidney disease with heart failure and with stage 5 chronic kidney disease, or end stage renal disease: Secondary | ICD-10-CM | POA: Diagnosis not present

## 2017-08-30 DIAGNOSIS — Z01810 Encounter for preprocedural cardiovascular examination: Secondary | ICD-10-CM

## 2017-08-30 DIAGNOSIS — I890 Lymphedema, not elsewhere classified: Secondary | ICD-10-CM | POA: Insufficient documentation

## 2017-08-30 DIAGNOSIS — J811 Chronic pulmonary edema: Secondary | ICD-10-CM | POA: Diagnosis not present

## 2017-08-30 DIAGNOSIS — G8929 Other chronic pain: Secondary | ICD-10-CM | POA: Insufficient documentation

## 2017-08-30 DIAGNOSIS — J449 Chronic obstructive pulmonary disease, unspecified: Secondary | ICD-10-CM | POA: Diagnosis not present

## 2017-08-30 DIAGNOSIS — I5032 Chronic diastolic (congestive) heart failure: Secondary | ICD-10-CM | POA: Diagnosis not present

## 2017-08-30 HISTORY — PX: CAPD INSERTION: SHX5233

## 2017-08-30 HISTORY — DX: Pulmonary hypertension, unspecified: I27.20

## 2017-08-30 LAB — COMPREHENSIVE METABOLIC PANEL
ALT: 13 U/L — ABNORMAL LOW (ref 14–54)
ANION GAP: 13 (ref 5–15)
AST: 17 U/L (ref 15–41)
Albumin: 3.8 g/dL (ref 3.5–5.0)
Alkaline Phosphatase: 107 U/L (ref 38–126)
BILIRUBIN TOTAL: 0.6 mg/dL (ref 0.3–1.2)
BUN: 43 mg/dL — ABNORMAL HIGH (ref 6–20)
CO2: 22 mmol/L (ref 22–32)
Calcium: 9.4 mg/dL (ref 8.9–10.3)
Chloride: 100 mmol/L — ABNORMAL LOW (ref 101–111)
Creatinine, Ser: 5.43 mg/dL — ABNORMAL HIGH (ref 0.44–1.00)
GFR calc Af Amer: 10 mL/min — ABNORMAL LOW (ref >60)
GFR, EST NON AFRICAN AMERICAN: 8 mL/min — AB (ref >60)
Glucose, Bld: 141 mg/dL — ABNORMAL HIGH (ref 65–99)
Potassium: 4 mmol/L (ref 3.5–5.1)
Sodium: 135 mmol/L (ref 135–145)
TOTAL PROTEIN: 7.7 g/dL (ref 6.5–8.1)

## 2017-08-30 LAB — CBC WITH DIFFERENTIAL/PLATELET
BAND NEUTROPHILS: 1 %
BLASTS: 0 %
Basophils Absolute: 0 10*3/uL (ref 0.0–0.1)
Basophils Relative: 0 %
EOS ABS: 0.3 10*3/uL (ref 0.0–0.7)
EOS PCT: 4 %
HEMATOCRIT: 37.2 % (ref 36.0–46.0)
Hemoglobin: 12.5 g/dL (ref 12.0–15.0)
LYMPHS PCT: 30 %
Lymphs Abs: 2.1 10*3/uL (ref 0.7–4.0)
MCH: 31 pg (ref 26.0–34.0)
MCHC: 33.6 g/dL (ref 30.0–36.0)
MCV: 92.3 fL (ref 78.0–100.0)
MONOS PCT: 8 %
Metamyelocytes Relative: 0 %
Monocytes Absolute: 0.6 10*3/uL (ref 0.1–1.0)
Myelocytes: 0 %
NEUTROS ABS: 4.1 10*3/uL (ref 1.7–7.7)
Neutrophils Relative %: 57 %
OTHER: 0 %
Platelets: 199 10*3/uL (ref 150–400)
Promyelocytes Absolute: 0 %
RBC: 4.03 MIL/uL (ref 3.87–5.11)
RDW: 14.4 % (ref 11.5–15.5)
WBC: 7.1 10*3/uL (ref 4.0–10.5)
nRBC: 0 /100 WBC

## 2017-08-30 LAB — GLUCOSE, CAPILLARY
GLUCOSE-CAPILLARY: 147 mg/dL — AB (ref 65–99)
Glucose-Capillary: 140 mg/dL — ABNORMAL HIGH (ref 65–99)

## 2017-08-30 LAB — PROTIME-INR
INR: 0.99
Prothrombin Time: 13 seconds (ref 11.4–15.2)

## 2017-08-30 LAB — APTT: APTT: 28 s (ref 24–36)

## 2017-08-30 SURGERY — LAPAROSCOPIC INSERTION CONTINUOUS AMBULATORY PERITONEAL DIALYSIS  (CAPD) CATHETER
Anesthesia: General | Site: Abdomen

## 2017-08-30 MED ORDER — PROPOFOL 10 MG/ML IV BOLUS
INTRAVENOUS | Status: DC | PRN
  Administered 2017-08-30: 160 mg via INTRAVENOUS

## 2017-08-30 MED ORDER — MIDAZOLAM HCL 2 MG/2ML IJ SOLN
INTRAMUSCULAR | Status: AC
  Filled 2017-08-30: qty 2

## 2017-08-30 MED ORDER — ACETAMINOPHEN 650 MG RE SUPP: 650.0000 mg | Freq: Four times a day (QID) | RECTAL | Status: DC | PRN

## 2017-08-30 MED ORDER — LIDOCAINE 2% (20 MG/ML) 5 ML SYRINGE
INTRAMUSCULAR | Status: AC
  Filled 2017-08-30: qty 5

## 2017-08-30 MED ORDER — CHLORHEXIDINE GLUCONATE 4 % EX LIQD: 60.0000 mL | Freq: Once | CUTANEOUS | Status: DC

## 2017-08-30 MED ORDER — ACETAMINOPHEN 325 MG PO TABS: 650.0000 mg | ORAL_TABLET | Freq: Four times a day (QID) | ORAL | Status: DC | PRN

## 2017-08-30 MED ORDER — SODIUM CHLORIDE 0.9 % IV SOLN
INTRAVENOUS | Status: DC | PRN
  Administered 2017-08-30: 07:00:00 via INTRAVENOUS

## 2017-08-30 MED ORDER — NEOSTIGMINE METHYLSULFATE 5 MG/5ML IV SOSY
PREFILLED_SYRINGE | INTRAVENOUS | Status: AC
  Filled 2017-08-30: qty 5

## 2017-08-30 MED ORDER — GLYCOPYRROLATE 0.2 MG/ML IJ SOLN
INTRAMUSCULAR | Status: DC | PRN
  Administered 2017-08-30: .8 mg via INTRAVENOUS

## 2017-08-30 MED ORDER — FENTANYL CITRATE (PF) 100 MCG/2ML IJ SOLN: 25.0000 ug | INTRAMUSCULAR | Status: DC | PRN

## 2017-08-30 MED ORDER — ONDANSETRON HCL 4 MG/2ML IJ SOLN: 4.0000 mg | Freq: Four times a day (QID) | INTRAMUSCULAR | Status: DC | PRN

## 2017-08-30 MED ORDER — ONDANSETRON 4 MG PO TBDP
4.0000 mg | ORAL_TABLET | Freq: Four times a day (QID) | ORAL | Status: DC | PRN
  Filled 2017-08-30: qty 1

## 2017-08-30 MED ORDER — DEXTROSE 5 % IV SOLN: 3.0000 g | INTRAVENOUS | Status: DC

## 2017-08-30 MED ORDER — ONDANSETRON HCL 4 MG/2ML IJ SOLN
INTRAMUSCULAR | Status: AC
  Filled 2017-08-30: qty 2

## 2017-08-30 MED ORDER — PROPOFOL 10 MG/ML IV BOLUS
INTRAVENOUS | Status: AC
  Filled 2017-08-30: qty 20

## 2017-08-30 MED ORDER — ROCURONIUM BROMIDE 100 MG/10ML IV SOLN
INTRAVENOUS | Status: DC | PRN
  Administered 2017-08-30: 10 mg via INTRAVENOUS
  Administered 2017-08-30: 50 mg via INTRAVENOUS

## 2017-08-30 MED ORDER — SODIUM CHLORIDE 0.9 % IR SOLN
Status: DC | PRN
Start: 2017-08-30 — End: 2017-08-30
  Administered 2017-08-30: 1000 mL

## 2017-08-30 MED ORDER — FENTANYL CITRATE (PF) 100 MCG/2ML IJ SOLN
INTRAMUSCULAR | Status: DC | PRN
Start: 2017-08-30 — End: 2017-08-30
  Administered 2017-08-30 (×4): 50 ug via INTRAVENOUS

## 2017-08-30 MED ORDER — ROCURONIUM BROMIDE 10 MG/ML (PF) SYRINGE
PREFILLED_SYRINGE | INTRAVENOUS | Status: AC
  Filled 2017-08-30: qty 5

## 2017-08-30 MED ORDER — LIDOCAINE HCL (CARDIAC) 20 MG/ML IV SOLN
INTRAVENOUS | Status: DC | PRN
  Administered 2017-08-30: 80 mg via INTRAVENOUS

## 2017-08-30 MED ORDER — ONDANSETRON HCL 4 MG/2ML IJ SOLN
INTRAMUSCULAR | Status: DC | PRN
  Administered 2017-08-30: 4 mg via INTRAVENOUS

## 2017-08-30 MED ORDER — 0.9 % SODIUM CHLORIDE (POUR BTL) OPTIME
TOPICAL | Status: DC | PRN
  Administered 2017-08-30: 1000 mL

## 2017-08-30 MED ORDER — PHENYLEPHRINE HCL 10 MG/ML IJ SOLN
INTRAMUSCULAR | Status: DC | PRN
  Administered 2017-08-30: 20 ug/min via INTRAVENOUS

## 2017-08-30 MED ORDER — MIDAZOLAM HCL 5 MG/5ML IJ SOLN
INTRAMUSCULAR | Status: DC | PRN
  Administered 2017-08-30 (×2): 1 mg via INTRAVENOUS

## 2017-08-30 MED ORDER — HYDROCODONE-ACETAMINOPHEN 5-325 MG PO TABS: 1.0000 | ORAL_TABLET | Freq: Four times a day (QID) | ORAL | 0 refills | Status: DC | PRN

## 2017-08-30 MED ORDER — FENTANYL CITRATE (PF) 250 MCG/5ML IJ SOLN
INTRAMUSCULAR | Status: AC
  Filled 2017-08-30: qty 5

## 2017-08-30 MED ORDER — SODIUM CHLORIDE 0.9 % IV SOLN
INTRAVENOUS | Status: DC | PRN
  Administered 2017-08-30: 09:00:00

## 2017-08-30 MED ORDER — DEXAMETHASONE SODIUM PHOSPHATE 10 MG/ML IJ SOLN
INTRAMUSCULAR | Status: AC
  Filled 2017-08-30: qty 1

## 2017-08-30 MED ORDER — HYDROCODONE-ACETAMINOPHEN 5-325 MG PO TABS: 1.0000 | ORAL_TABLET | ORAL | Status: DC | PRN

## 2017-08-30 MED ORDER — DEXAMETHASONE SODIUM PHOSPHATE 10 MG/ML IJ SOLN
INTRAMUSCULAR | Status: DC | PRN
  Administered 2017-08-30: 5 mg via INTRAVENOUS

## 2017-08-30 MED ORDER — BUPIVACAINE-EPINEPHRINE (PF) 0.25% -1:200000 IJ SOLN
INTRAMUSCULAR | Status: AC
  Filled 2017-08-30: qty 30

## 2017-08-30 MED ORDER — NEOSTIGMINE METHYLSULFATE 10 MG/10ML IV SOLN
INTRAVENOUS | Status: DC | PRN
  Administered 2017-08-30: 5 mg via INTRAVENOUS

## 2017-08-30 MED ORDER — DOCUSATE SODIUM 100 MG PO CAPS: 100.0000 mg | ORAL_CAPSULE | Freq: Two times a day (BID) | ORAL | 0 refills | Status: AC

## 2017-08-30 MED ORDER — ACETAMINOPHEN 500 MG PO TABS
1000.0000 mg | ORAL_TABLET | ORAL | Status: AC
  Administered 2017-08-30: 1000 mg via ORAL
  Filled 2017-08-30: qty 2

## 2017-08-30 MED ORDER — PROMETHAZINE HCL 25 MG/ML IJ SOLN: 6.2500 mg | INTRAMUSCULAR | Status: DC | PRN

## 2017-08-30 SURGICAL SUPPLY — 41 items
ADAPTER TITANIUM MEDIONICS (MISCELLANEOUS) ×3 IMPLANT
BAG DECANTER FOR FLEXI CONT (MISCELLANEOUS) ×3 IMPLANT
BLADE CLIPPER SURG (BLADE) IMPLANT
CANISTER SUCT 3000ML PPV (MISCELLANEOUS) IMPLANT
CATH EXTENDED DIALYSIS (CATHETERS) ×3 IMPLANT
CHLORAPREP W/TINT 26ML (MISCELLANEOUS) ×3 IMPLANT
COVER SURGICAL LIGHT HANDLE (MISCELLANEOUS) ×3 IMPLANT
DECANTER SPIKE VIAL GLASS SM (MISCELLANEOUS) ×3 IMPLANT
DERMABOND ADVANCED (GAUZE/BANDAGES/DRESSINGS) ×2
DERMABOND ADVANCED .7 DNX12 (GAUZE/BANDAGES/DRESSINGS) ×1 IMPLANT
DRSG PAD ABDOMINAL 8X10 ST (GAUZE/BANDAGES/DRESSINGS) ×3 IMPLANT
DRSG TEGADERM 4X4.75 (GAUZE/BANDAGES/DRESSINGS) ×3 IMPLANT
ELECT REM PT RETURN 9FT ADLT (ELECTROSURGICAL) ×3
ELECTRODE REM PT RTRN 9FT ADLT (ELECTROSURGICAL) ×1 IMPLANT
GAUZE SPONGE 4X4 12PLY STRL (GAUZE/BANDAGES/DRESSINGS) ×3 IMPLANT
GAUZE SPONGE 4X4 12PLY STRL LF (GAUZE/BANDAGES/DRESSINGS) ×3 IMPLANT
GLOVE BIO SURGEON STRL SZ 6 (GLOVE) ×3 IMPLANT
GLOVE BIOGEL PI IND STRL 6.5 (GLOVE) ×3 IMPLANT
GLOVE BIOGEL PI INDICATOR 6.5 (GLOVE) ×6
GOWN STRL REUS W/ TWL LRG LVL3 (GOWN DISPOSABLE) ×3 IMPLANT
GOWN STRL REUS W/TWL LRG LVL3 (GOWN DISPOSABLE) ×6
HOVERMATT SINGLE USE (MISCELLANEOUS) ×3 IMPLANT
KIT BASIN OR (CUSTOM PROCEDURE TRAY) ×3 IMPLANT
KIT ROOM TURNOVER OR (KITS) ×3 IMPLANT
NS IRRIG 1000ML POUR BTL (IV SOLUTION) ×3 IMPLANT
PAD ARMBOARD 7.5X6 YLW CONV (MISCELLANEOUS) ×3 IMPLANT
SCISSORS LAP 5X35 DISP (ENDOMECHANICALS) ×3 IMPLANT
SET EXT 12IN DIALYSIS STAY-SAF (MISCELLANEOUS) ×3 IMPLANT
SET IRRIG TUBING LAPAROSCOPIC (IRRIGATION / IRRIGATOR) ×3 IMPLANT
SLEEVE ENDOPATH XCEL 5M (ENDOMECHANICALS) ×6 IMPLANT
STYLET FALLER (MISCELLANEOUS) ×3 IMPLANT
STYLET FALLER MEDIONICS (MISCELLANEOUS) ×3 IMPLANT
SUT MNCRL AB 4-0 PS2 18 (SUTURE) ×3 IMPLANT
SUT SILK 0 TIES 10X30 (SUTURE) ×3 IMPLANT
TOWEL OR 17X24 6PK STRL BLUE (TOWEL DISPOSABLE) ×3 IMPLANT
TOWEL OR 17X26 10 PK STRL BLUE (TOWEL DISPOSABLE) ×3 IMPLANT
TRAY LAPAROSCOPIC MC (CUSTOM PROCEDURE TRAY) ×3 IMPLANT
TROCAR 5MMX150MM (TROCAR) ×3 IMPLANT
TROCAR XCEL NON-BLD 5MMX100MML (ENDOMECHANICALS) ×3 IMPLANT
TUBING CYSTO DISP (UROLOGICAL SUPPLIES) ×3 IMPLANT
TUBING INSUFFLATION (TUBING) ×3 IMPLANT

## 2017-08-30 NOTE — Interval H&P Note (Signed)
History and Physical Interval Note:  08/30/2017 7:08 AM  [name]  has presented today for surgery, with the diagnosis of end stage renal disease  The various methods of treatment have been discussed with the patient and family. After consideration of risks, benefits and other options for treatment, the patient has consented to  Procedure(s): [name]  (CAPD) CATHETER (N/A) as a surgical intervention .  The patient's history has been reviewed, patient examined, no change in status, stable for surgery.  I have reviewed the patient's chart and labs.  Questions were answered to the patient's satisfaction.     Toma Arts Rich Brave

## 2017-08-30 NOTE — Transfer of Care (Signed)
Immediate Anesthesia Transfer of Care Note  Patient: [name]  Procedure(s) Performed: Procedure(s): LAPAROSCOPIC INSERTION CONTINUOUS AMBULATORY PERITONEAL DIALYSIS  (CAPD) CATHETER (N/A)  Patient Location: PACU  Anesthesia Type:General  Level of Consciousness: awake, alert , oriented and patient cooperative  Airway & Oxygen Therapy: Patient Spontanous Breathing and Patient connected to nasal cannula oxygen  Post-op Assessment: Report given to RN and Post -op Vital signs reviewed and stable  Post vital signs: Reviewed and stable  Last Vitals:  Vitals:   08/30/17 0619 08/30/17 0950  BP: 129/75 136/75  Pulse:  81  Resp: 19 15  Temp: 37.1 C 36.4 C  SpO2: 98% 100%    Last Pain:  Vitals:   08/30/17 0950  TempSrc:   PainSc: (P) 0-No pain      Patients Stated Pain Goal: 2 (11/57/26 2035)  Complications: No apparent anesthesia complications

## 2017-08-30 NOTE — Op Note (Signed)
Operative Note  [name]  [id number]  [id number]  08/30/2017   Surgeon: Victorino Sparrow ConnorMD  Assistant: OR staff  Procedure performed: 1. Laparoscopic lysis of adhesions x 60 minutes 2. Laparoscopic assisted insertion of tunneled peritoneal dialysis catheter  Preop diagnosis: end stage renal disease Post-op diagnosis/intraop findings: same  Specimens: none Retained items: CAPD catheter EBL: 65YY Complications: none  Description of procedure: After obtaining informed consent the patient was taken to the operating room and placed supine on operating room table wheregeneral endotracheal anesthesia was initiated, preoperative antibiotics were administered, SCDs applied, and a formal timeout was performed. The patient's abdomen was prepped and draped in usual sterile fashion. Peritoneal access was gained using a Visiport technique in the left upper quadrant and insufflation to 15 mmHg ensued without issue. She is noted to have some omental adhesions to the right upper quadrant as well as dense omental and small bowel adhesions to the anterior abdominal wall in the lower abdomen and pelvis from her prior hysterectomy and C-sections. 25 mm trochars were placed in the left hemiabdomen and then careful sharp and blunt dissection with cautery where indicated was used to clear the omental adhesions from the anterior abdominal wall. Her omentum was noted to be large and very thick and heavy. Just to the right of midline there was a region where a loop of small bowel was densely adhered to the anterior abdominal wall. This was taken down sharply and the bowel inspected and confirmed to be free of injury. We cleared the lower midlineabdominal adhesions, I found that she had more dense adhesions down in the lower pelvis and these were lysed as well. She is noted to have coagulopathic bleeding of multiple surfaces and hemostasis was ensured using cautery where possible. Then using a long 15 mm  trocar, an incision just to the left of the umbilicus was used in the trocar was introduced at a 45 angle tunneling down the preperitoneal space to just above the pubic bone. The peritoneal component of the dialysis catheter was introduced over a stylette into this trocar and the coil was directed to lie in the pelvis, sitting just to the right of midline. The trocar and stylette were removed and then the subcutaneous portion of the catheter was brought into the field and the 2 ends of the 2 parts the catheter were trimmed such that the turn in the catheter would be 2 fingerbreadths below the subcostal margin. The titanium connector was then introduced into both ends and secured with 2-0 silk on either side to connect the peritoneal with the subcutaneous portion. Incision was made 2 fingerbreadth below the costal margin and the soft tissues bluntly dissected using a Kelly clamp. The sharp tunneler was then attached to the distal end of the catheter and the catheter was tunneled through the subcutaneous space up to the subcostal incision, and then back down through the subcutaneous space to exit in the left upper quadrant. The external connector was then applied to the catheter. At this point the abdomen was reinsufflated and the intra-abdominal component of the catheter was reinspected and found to be in good position, again hemostasis was confirmed. Abdomen was then desufflated and the cystoscopy tubing connected to the external portion of the catheter. I instilled about 700 mL of sterile saline was able to evacuate 500 of that back out without difficulty. The final flush was then instilled within the catheter and the external tubing was connected. All the skin incisions were closed with subcuticular Monocryl  and Dermabond and a large dry bulky dressing was applied over the catheter using occlusive dressing. The patient was then awakened, extubated and taken to PACU in stable condition.   All counts were  correct at the completion of the case.

## 2017-08-30 NOTE — Anesthesia Postprocedure Evaluation (Signed)
Anesthesia Post Note  Patient: [name]  Procedure(s) Performed: Procedure(s) (LRB): LAPAROSCOPIC INSERTION CONTINUOUS AMBULATORY PERITONEAL DIALYSIS  (CAPD) CATHETER (N/A)     Patient location during evaluation: PACU Anesthesia Type: General Level of consciousness: awake and alert Pain management: pain level controlled Vital Signs Assessment: post-procedure vital signs reviewed and stable Respiratory status: spontaneous breathing, nonlabored ventilation, respiratory function stable and patient connected to nasal cannula oxygen Cardiovascular status: blood pressure returned to baseline and stable Postop Assessment: no signs of nausea or vomiting Anesthetic complications: no    Last Vitals:  Vitals:   08/30/17 1105 08/30/17 1120  BP: 119/67 114/72  Pulse: 68 72  Resp: 17 19  Temp:  (!) 36.4 C  SpO2: 96% 96%    Last Pain:  Vitals:   08/30/17 0950  TempSrc:   PainSc: 0-No pain                 Tiajuana Amass

## 2017-08-30 NOTE — H&P (View-Only) (Signed)
[name] Patient #: [id number] DOB: 09/29/1966 Married / Language: English / Race: Black or African American Female   History of Present Illness Patient words: 51yo woman with multiple severe comorbidities including but not limited to CHF (right sided HF, pulmonary hypertension with peak pulm pressure 85mmHg on last echo), CAD, diabetes, morbid obesity with lymphedema and hypoventilation, diabetes who desires peritoneal dialysis catheter insertion for home PD. She has been on dialysis for about a year and a half. Currently uses left forearm avf. She desires PD due to the convenience of doing this at home. She and her husband are both disabled and transportation is an issue with her. She also wants the time to visit her family instead of sitting in the dialysis unit. She has friends and acquaintances on PD and has heard nothing but good things about it. She has also done a good amount of research in addition to speaking with the dialysis nurse. Her only abdominal surgical history includes 2 C-sections and a hysterectomy all via Pfannenstiel. She does not use any blood thinners.  The patient is a 51 year old female.   Past Surgical History  Cesarean Section - Multiple  Dialysis Shunt / Fistula  Hysterectomy (not due to cancer) - Partial  Oral Surgery  Spinal Surgery - Neck  Thyroid Surgery   Diagnostic Studies History Colonoscopy  never Mammogram  never Pap Smear  >5 years ago  Allergies  VANCOMYCIN  Rash, Swelling, Vomiting.  Medication History  ALPRAZolam (0.5MG Tablet, Oral) Active. AmLODIPine Besylate (10MG Tablet, Oral) Active. Benadryl Allergy (25MG Capsule, Oral) Active. Benadryl Allergy (25MG Tablet, Oral) Active. NovoLOG (100UNIT/ML Solution, Subcutaneous) Active. Lantus (100UNIT/ML Solution, Subcutaneous) Active. Rena-Vite (Oral) Active. Reglan (5MG Tablet, Oral) Active. Metoprolol Tartrate (50MG Tablet, Oral) Active. Medications  Reconciled  Social History  Alcohol use  Remotely quit alcohol use. Caffeine use  Coffee. Tobacco use  Never smoker.  Family History  Alcohol Abuse  Family Members In General. Anesthetic complications  Family Members In General. Arthritis  Family Members In General. Bleeding disorder  Sister. Breast Cancer  Family Members In General. Cancer  Family Members In General. Cerebrovascular Accident  Mother, Sister. Cervical Cancer  Family Members In General. Colon Cancer  Family Members In General. Colon Polyps  Family Members In General. Depression  Family Members In General. Diabetes Mellitus  Sister. Heart Disease  Sister. Hypertension  Sister. Ischemic Bowel Disease  Family Members In General. Kidney Disease  Family Members In General. Malignant Neoplasm Of Pancreas  Family Members In General. Melanoma  Family Members In General. Migraine Headache  Family Members In General. Ovarian Cancer  Family Members In General. Prostate Cancer  Family Members In General. Rectal Cancer  Family Members In General. Respiratory Condition  Sister. Seizure disorder  Family Members In General. Thyroid problems  Family Members In General.  Pregnancy / Birth History  Age at menarche  13 years. Contraceptive History  Oral contraceptives. Gravida  4 Irregular periods  Length (months) of breastfeeding  7-12 Maternal age  15-20 Para  3  Other Problems  Asthma  Chronic Obstructive Lung Disease  Chronic Renal Failure Syndrome  Congestive Heart Failure  Diabetes Mellitus  General anesthesia - complications  High blood pressure  Home Oxygen Use  Oophorectomy  Left. Thyroid Disease     Review of Systems  General Present- Fatigue. Not Present- Appetite Loss, Chills, Fever, Night Sweats, Weight Gain and Weight Loss. Skin Present- Dryness. Not Present- Change in Wart/Mole, Hives, Jaundice, New Lesions, Non-Healing Wounds,   Rash and Ulcer. HEENT  Present- Seasonal Allergies. Not Present- Earache, Hearing Loss, Hoarseness, Nose Bleed, Oral Ulcers, Ringing in the Ears, Sinus Pain, Sore Throat, Visual Disturbances, Wears glasses/contact lenses and Yellow Eyes. Cardiovascular Present- Swelling of Extremities. Not Present- Chest Pain, Difficulty Breathing Lying Down, Leg Cramps, Palpitations, Rapid Heart Rate and Shortness of Breath. Gastrointestinal Not Present- Abdominal Pain, Bloating, Bloody Stool, Change in Bowel Habits, Chronic diarrhea, Constipation, Difficulty Swallowing, Excessive gas, Gets full quickly at meals, Hemorrhoids, Indigestion, Nausea, Rectal Pain and Vomiting. Musculoskeletal Present- Joint Stiffness, Muscle Pain and Swelling of Extremities. Not Present- Back Pain, Joint Pain and Muscle Weakness. Neurological Present- Trouble walking. Not Present- Decreased Memory, Fainting, Headaches, Numbness, Seizures, Tingling, Tremor and Weakness. Psychiatric Not Present- Anxiety, Bipolar, Change in Sleep Pattern, Depression, Fearful and Frequent crying. Hematology Present- Blood Thinners. Not Present- Easy Bruising, Excessive bleeding, Gland problems, HIV and Persistent Infections. All other systems negative  Vitals  Weight: 138.25 lb Height: 65in Body Surface Area: 1.69 m Body Mass Index: 23.01 kg/m  Temp.: 97.66F  Pulse: 95 (Regular)  P.OX: 95% (Room air) BP: 156/90 (Sitting, Left Arm, Standard)       Physical Exam The physical exam findings are as follows: Note:Gen: A&Ox3, no distress. Morbidly obese woman in a wheelchair Head: normocephalic, atraumatic, EOMI, anicteric. Neck: supple without mass or abnormality Chest: unlabored respirations. Currently on room air. Cardiovascular: RRR. Thrill in left forearm AV fistula Abdomen: obese, nontender nondistended. No palpable abnormalities Extremities: bilateral lower extremity lymphedema Neuro: grossly intact Psych: appropriate mood and affect Skin: no lesions  or rashes on limited exam    Assessment & Plan  END-STAGE RENAL DISEASE ON HEMODIALYSIS (N18.13) Story: 51 year old woman with multiple severe comorbidities, desires peritoneal dialysis catheter. I discussed with her the nature of the surgery including the necessity of general anesthesia and abdominal insufflation. We discussed the risks of surgery including bleeding, infection, pain, scarring, intra-abdominal injury; as well as complications of the peritoneal dialysis catheter including occlusion, need for repositioning or removal etc. I emphasized to her that she is at high risk for general anesthesia because of her comorbidities specifically her pulmonary hypertension and that I would like her to get cardiac clearance prior to proceeding with general anesthesia. She expressed understanding and all her questions were answered. She is eager to move forward. We will refer her to see a cardiologist to assess her safety for general anesthesia prior to scheduling. DIABETES (E11.9) MORBID OBESITY (E66.01) RIGHT-SIDED CONGESTIVE HEART FAILURE (I50.810) PULMONARY HYPERTENSION (I27.20) SLEEP APNEA (G47.30) LYMPHEDEMA (I89.0) HYPERTENSION (I10) CORONARY ARTERY DISEASE (I25.10) CHRONIC PAIN (G89.29)    She has been evaluated by Dr. Harrington Challenger of cardiology and has undergone catheterization.  She has significant pulmonary hypertension with pas in the mid 70s. Dr. Harrington Challenger recommends that she can undergo surgery with careful invasive Intra-Op monitoring by anesthesia (she did discuss this case with Dr. Linna Caprice too), and does recommend that we proceed as hemodialysis will become increasingly difficult for the patient due to her heart problems.

## 2017-08-30 NOTE — Anesthesia Procedure Notes (Signed)
Arterial Line Insertion Performed by: CRNA

## 2017-08-30 NOTE — Discharge Instructions (Signed)
General Anesthesia, Adult, Care After These instructions provide you with information about caring for yourself after your procedure. Your health care provider may also give you more specific instructions. Your treatment has been planned according to current medical practices, but problems sometimes occur. Call your health care provider if you have any problems or questions after your procedure. What can I expect after the procedure? After the procedure, it is common to have:  Vomiting.  A sore throat.  Mental slowness.  It is common to feel:  Nauseous.  Cold or shivery.  Sleepy.  Tired.  Sore or achy, even in parts of your body where you did not have surgery.  Follow these instructions at home: For at least 24 hours after the procedure:  Do not: ? Participate in activities where you could fall or become injured. ? Drive. ? Use heavy machinery. ? Drink alcohol. ? Take sleeping pills or medicines that cause drowsiness. ? Make important decisions or sign legal documents. ? Take care of children on your own.  Rest. Eating and drinking  If you vomit, drink water, juice, or soup when you can drink without vomiting.  Drink enough fluid to keep your urine clear or pale yellow.  Make sure you have little or no nausea before eating solid foods.  Follow the diet recommended by your health care provider. General instructions  Have a responsible adult stay with you until you are awake and alert.  Return to your normal activities as told by your health care provider. Ask your health care provider what activities are safe for you.  Take over-the-counter and prescription medicines only as told by your health care provider.  If you smoke, do not smoke without supervision.  Keep all follow-up visits as told by your health care provider. This is important. Contact a health care provider if:  You continue to have nausea or vomiting at home, and medicines are not  helpful.  You cannot drink fluids or start eating again.  You cannot urinate after 8-12 hours.  You develop a skin rash.  You have fever.  You have increasing redness at the site of your procedure. Get help right away if:  You have difficulty breathing.  You have chest pain.  You have unexpected bleeding.  You feel that you are having a life-threatening or urgent problem. This information is not intended to replace advice given to you by your health care provider. Make sure you discuss any questions you have with your health care provider. Document Released: 03/14/2001 Document Revised: 05/10/2016 Document Reviewed: 11/20/2015 Elsevier Interactive Patient Education  2018 [employer] (CAPD) CATHETER PLACEMENT:  POST OPERATIVE INSTRUCTIONS  FOLLOW UP with the Peritoneal Dialysis Nurses  [address] [place], [place] [address]  Call [phone] or your neprologist to help arrange training/flushes of your CAPD catheter   The CAPD nurses & Nephrology usually follow you closely, making the need for follow-up in the [facility] surgery office redundant and therefore not always needed.  If they or you have concerns, please call us for possible follow-up in our office  1. Do NOT shower until dialysis nursing staff have advised.  Do NOT submerge in a bathtub or hot tub. 2. Your Home Therapy RN will advise and educate you on showering, bathing, and swimming when you are in training. 3. The Peritoneal Dialysis nurse will remove your waterproof bandages in the Dialysis Center a few days after surgery.  Do not remove the bandages until seen by them.  If  you dressing becomes wet, saturated, or falls off call your Home Therapy RN. 4. ACTIVITIES as tolerated:   a. You may resume regular (light) daily activities beginning the next day--such as daily self-care, walking, climbing stairs--gradually increasing activities as tolerated.  If you can walk 30 minutes without difficulty, it  is safe to try more intense activity such as jogging, treadmill, bicycling, low-impact aerobics,  etc. b. No swimming within the 1st month of catheter placement.  You must have a Dr's order. c. Save the most intensive and strenuous activity for last such as sit-ups, heavy lifting, contact sports, etc  Refrain from any heavy lifting or straining until you are off narcotics for pain control.   d. DO NOT PUSH THROUGH PAIN.  Let pain be your guide: If it hurts to do something, don't do it.  Pain is your body warning you to avoid that activity for another week until the pain goes down. e. You may drive when you are no longer taking prescription pain medication, you can comfortably wear a seatbelt, and you can safely maneuver your car and apply brakes. f. You may have sexual intercourse when it is comfortable.  g. Be sure your catheter is taped to Your abdomen nor injured. Did not allow catheter to ankle, or tension on the catheter-it may cause damage to skin over the catheter h. You will be instructed on what to do an emergency, and will have access to on call our in 24 hours a day. The number to reach the home therapy nurse as listed below. 5. DIET: Follow a light bland diet the first 24 hours after arrival home, such as soup, liquids, crackers, etc.  Be sure to include lots of fluids daily.  Avoid fast food or heavy meals as your are more likely to get nauseated.   6. Take your usually prescribed home medications unless otherwise directed. 7. PAIN CONTROL: a. Pain is best controlled by a usual combination of three different methods TOGETHER: i. Ice/Heat ii. Tylenol (over the counter pain medication) iii. Prescription pain medication b. Most patients will experience some swelling and bruising around the incisions.  Ice packs or heating pads (30-60 minutes up to 6 times a day) will help. Use ice for the first few days to help decrease swelling and bruising, then switch to heat to help relax tight/sore  spots and speed recovery.  Some people prefer to use ice alone, heat alone, alternating between ice & heat.  Experiment to what works for you.  Swelling and bruising can take several weeks to resolve.   c. It is helpful to take an over-the-counter pain medication regularly for the first few weeks.  Using acetaminophen (Tylenol, etc) 500-620m four times a day (every meal & bedtime) is usually safest since NSAIDs are not advisable in patients with kidney disease. d. A  prescription for pain medication (such as oxycodone, hydrocodone, etc) should be given to you upon discharge.  Take your pain medication as prescribed.  i. If you are having problems/concerns with the prescription medicine (does not control pain, nausea, vomiting, rash, itching, etc), please call [place]([phone] see if we need to switch you to a different pain medicine that will work better for you and/or control your side effect better. ii. If you need a refill on your pain medication, please contact your pharmacy.  They will contact our office to request authorization. Prescriptions will not be filled after 5 pm or on week-ends. 8. Avoid getting constipated.  Between  the surgery and the pain medications, it is common to experience some constipation.  Increasing fluid intake and taking a fiber supplement (such as Metamucil, Citrucel, FiberCon, MiraLax, etc) 1-2 times a day regularly will usually help prevent this problem from occurring.  A mild laxative (prune juice, Milk of Magnesia, MiraLax, etc) should be taken according to package directions if there are no bowel movements after 48 hours.       FOLLOW UP: Peritoneal Dialysis nurses  [address], [place], [place] [address]  Call [phone] or your neprologist to help arrange training/flushes of your CAPD catheter    -The CAPD nurses & Nephrology usually follow you closely, making the need for follow-up in our office redundant and therefore not needed.  If they or you have  concerns, please call us for possible follow-up in our office   -Please call [facility] at [phone] only as needed.  WHEN TO CALL us [phone]: 1. Poor pain control 2. Reactions / problems with new medications (rash/itching, nausea, etc)  3. Fever over 101.5 F (38.5 C) 4. Worsening swelling or bruising 5. Continued bleeding from incision. 6. Increased pain, redness, or drainage from the incision   The clinic staff is available to answer your questions during regular business hours (8:30am-5pm).  Please dont hesitate to call and ask to speak to one of our nurses for clinical concerns.   If you have a medical emergency, go to the nearest emergency room or call 911.  A surgeon from [facility] is always on call at the hospitals  9. IF YOU HAVE DISABILITY OR FAMILY LEAVE FORMS, BRING THEM TO THE OFFICE FOR PROCESSING.  DO NOT GIVE THEM TO YOUR DOCTOR.  [facility], [place], [place], [place], [place]  [address] ? MAIN: [phone] ? TOLL FREE: [phone] ?  FAX [phone] [email or web address]  Peritoneal Dialysis - An Overview Dialysis can be done using a machine outside of the body (hemodialysis). Or, it can be done inside the body (peritoneal dialysis). The word "peritoneal" refers to the lining or membrane of the belly (abdominal cavity). The peritoneal membrane is a thin, plastic-like lining inside the belly that covers the organs and fits in the abdominal or peritoneal cavity, such as the stomach, liver and the kidneys. This lining works like a filter. It will allow certain things to pass from your blood through the lining and into a special solution that has been placed into your belly. In this type of dialysis, the peritoneum is used to help clean the blood.  If you need dialysis, your kidneys are not working right. Healthy kidneys take out extra water and waste products, which becomes urine. When the kidneys do not do  this, serious problems can develop. The waste and water build up in the blood. Your hands and feet might swell. You may feel tired, weak or sick to your stomach. Also, your blood pressure may rise. If not treated, you could die. Dialysis is a treatment that does the work that your kidneys would do if they were healthy.  It cleans your blood.   It will make sure your body has the right amount of certain chemicals that it needs. They include potassium, sodium and bicarbonate.   It will help control your blood pressure.  UNDERSTANDING PERITONEAL DIALYSIS  Here is how peritoneal dialysis works:   First, you will have surgery to put a soft plastic tube (catheter) into your belly (abdomen). This will allow you  to easily connect yourself to special tubing, which will then let a special dialysis solution to be placed into your abdomen.   For each treatment, you will need at least one bag of dialysis solution (a liquid called dialysate). It is a mix of water that is pure and free of germs (sterile), sugar (dextrose) and the nutrients and minerals found in your blood. Sometimes, more than one bag is needed to get the right amount of fluid for your abdomen. Your caregiver will explain what size and how many bags you will need.   The dialysate is slowly put through the catheter to fill the abdomen (called the peritoneal cavity). This dialysate will need to stay in your body for 3-4 hours. This is known as the dwell time.   The solution is working to clean the blood and remove wastes from your body. At the end of this time, the solution is drained from your body through tubing into an empty bag. It is then replaced with a fresh dialysate.   The draining and replacing of the dialysate is called an exchange or cycle. The catheter is capped after each exchange. Once the solution is in your body, you are then free to do whatever you would like until the next exchange. Most people will need to do 4-5 exchanges each  day.   There are two different methods that can be used.   Continuous ambulatory peritoneal dialysis (CAPD): You put the solution into your abdomen, cap your catheter and then go about your day. Several hours later, you reconnect to a tubing set up, drain out the solution and then put more solution in. This is done several times a day. No machine is needed.   Continuous cycler-assisted peritoneal dialysis (CCPD): A machine is used, which fills the abdomen with dialysate and then drains it. This happens several times. It usually is done at night while you are sleeping. When you wake up, you can disconnect from the machine and are free to go to go about your day.  PREPARING FOR EXCHANGES  Discuss the details of the procedure with your caregivers. You will be working with a nurse who is specially trained in doing dialysis. Make sure you understand:   How to do an exchange.   How much solution you need.   What type of solution you will need.   How often you should do an exchange. Ask:   How many times each day?   When? At meals? At bedtime?   Always keep the dialysate bags and other supplies in a cool, clean and dry place.   Keeping everything clean is very important.   The catheter and its cap must be free from germs (sterile)   The adapter also must be sterile. It attaches the dialysis bag and tubing to the catheter.   Clean the area of your body around the catheter every day. Use a chemical that fights infection (antiseptic).   Wash your hands thoroughly before starting an exchange.   You may be taught to wear a mask to cover your nose and mouth. This makes infection less likely to happen.   You may be taught to close doors, windows and turn off any fans before doing an exchange.   Check the dialysate bag very carefully.   Make sure it is the right size bag for you. This information is on the label.   Also, make sure it is the right mixture. For some people, the dialysate  contents vary.  For instance, the mixture might be a stronger solution for overnight.   Check the expiration date (the last date you can use the bag). It also is on the label. If the date has gone by, throw away the bag.   The solution should be clear. You should be able to see any writing on the side of the bag clearly through the solution. Do not use a cloudy solution.   Gently squeeze the bag to make sure there are no leaks.   Use a dry heating pad to warm the dialysate in the bag. Leave the cover on the bag while you do this.   This is for comfort. You can skip this step if you want.   Never place the bag of solution under warm or hot water. Water from a faucet is not sterile and could cause germs to get into the bag. Infection could then result.  PERFORMING AN EXCHANGE  For continuous ambulatory dialysis:   Attach the dialysis bag and tubing to your catheter. Hang the bag so that gravity (the natural downward pull) draws the solution down and into your abdomen once the clamps are opened. This should take about 10 minutes.   Remove the bag and tubing from the catheter. Cap the catheter.   The solution stays in the abdomen for 3-4 hours (dwell time). The solution is working to clean the blood and remove wastes from your body.   When you are ready to drain the solution for another exchange, take the cap off the catheter. Then, attach the catheter to tubing, which is attached to an empty bag. Place this empty bag below the abdomen or on the floor or stool and undo the clamps.   Gravity helps pull the fluid out of the abdomen and into the bag. The fluid in the bag may look yellow and clear, like urine. It usually takes about 20 minutes to drain the fluid out of the abdomen.   When the solution has drained, start the process again by infusing a new bag of dialysate and then capping the catheter.   This should continue until you have used all of the solution that you are to use each day.    Sometimes, a small machine is used overnight. It is called a mini-cycler. This is done if the body cannot go all night without an exchange. The machine lets you sleep without having to get up and do an exchange.   For continuous cycler-assisted dialysis:   You will be taught how to set up or program your machine.   When you are ready for bed, put the dialysate bags onto the cycler machine. Put on exactly the number of bags that your caregiver said to use.   Connect your catheter to the machine and turn the cycler machine on.   Overnight, the cycler will do several exchanges. It often does three to five, sometimes more.   Solution that is in your abdomen in the morning will stay during the day. The machine is set to make the daytime solution stronger, if that is needed.   In the morning, you will disconnect from the machine and cap your catheter and go about your day.   Sometimes, an extra exchange is done during the day. This may be needed to remove excess waste or fluid.  IMPORTANT REMINDERS  You will need to follow a very strict schedule. Every step of the dialysis procedure must be done every day. Sometimes, several times a day.  Altogether, this might take an extra 2 hours or more. However, you must stick to the routine. Do not skip a day. Do not skip a procedure.   Some people find it helpful to work with a Social worker or Education officer, museum in addition to the renal (kidney) nurse. They can help you figure out how to change your daily routine to fit in the dialysis sessions.   You may need to change your diet. Ask your caregiver for advice, or talk with a nutritionist about what you should and should not eat.   You will need to weigh yourself every day and keep track of what your weight is.   You may be taught how to check your blood pressure before every exchange. Your blood pressure reading will help determine what type of solution to use. If your blood pressure is too high, you may need  a stronger solution.  RISKS AND COMPLICATIONS  Possible problems vary, depending on the method you use. Your overall health also can have an effect. Problems that could develop because of dialysis include:  Infection. This is the most common problem. It could occur:   In the peritoneum. This is called peritonitis.   Around the catheter.   Weight gain. The dialysate contains a type of sugar known as dextrose. Dextrose has a lot of calories. The body takes in several hundred calories from this sugar each day.   Weakened muscles in the abdomen. This can result from all of the fluid that your body has to hold in the abdomen.   Catheter replacement. Sometimes, a new one has to be put in.   Change in dialysis method. Due to some complications, you may need to change to hemodialysis for a short time and have your dialysis done at a center.   Trouble adjusting to your new lifestyle. In some people, this leads to depression.   Sleep problems.   Dialysis-related amyloidosis. This sometimes occurs after 5 years of dialysis. Protein builds up in the blood. This can cause painful deposits on bones, joints and tendons (which connect muscle to bone). Or, it can cause hollow spots in bones that make them more likely to break.   Excess fluid. Your body may absorb too much of the fluid that is held in the abdomen. This can lead to heart or lung problems.  SEEK MEDICAL CARE IF:   You have any problems with an exchange.   The area around the catheter becomes red or painful.   The catheter seems loose, or it feels like it is coming out.   A bag of dialysate looks cloudy. Or, the liquid is an unusual color.   Abdominal pain or discomfort.   You feel sick to your stomach (nauseous) or throw up (vomit).   You develop a fever of more than 102 F (38.9 C).  SEEK IMMEDIATE MEDICAL CARE IF:  You develop a fever of more than 102 F (38.9 C). Document Released: 10/03/2009 Document Revised: 11/25/2011  Document Reviewed: 10/03/2009 [facility] Patient Information 2012 [place].  Diet for Peritoneal Dialysis This diet may be modified in protein, sodium, phosphorus, potassium, or fluid, depending on your needs. The goals of nutrition therapy are similar to those for patients on hemodialysis. Providing enough protein to replace peritoneal losses is a priority. USES OF THIS DIET The diet is designed for the patient with end-stage kidney (renal) disease, who is treated by peritoneal dialysis. Treatment options include:  Continuous Ambulatory Peritoneal Dialysis (CAPD): Usually 4 exchanges of  1.5 to 2 liter volumes of glucose (sugar) and electrolyte-containing dialysate.   Continuous Cyclic Peritoneal Dialysis (CCPD): Essentially a reversal of CAPD, with shorter exchanges at night and a longer one during the day.   Intermittent Peritoneal Dialysis (IPD): 10 to 12 hours of exchanges, 2 to 3 times weekly.  ADEQUACY The diet may not meet the Recommended Dietary Allowances of the [employer] for calcium and ascorbic acid. Protein and water-soluble vitamin needs may be increased because of losses into the dialysate. Recommended daily supplements are the same as for hemodialysis patients. ASSESSMENT/DETERMINATION OF DIET Dietary needs will differ between patients. Parameters must be individualized. Protein  Guidelines: 1.2 to 1.3 gm/kg/day OR 1.5 gm/kg/day if patient is malnourished, catabolic, or has a protracted episode of peritonitis. A minimum of 50% of the protein intake should be of high biological value.   Goals: Meet protein requirements and replace dialysate losses while avoiding excessive accumulation of waste products. Achieve serum albumin greater than 3.5 g/dL.   Evaluate: Current nutritional status, serum albumin and BUN levels, presence of peritonitis.  Sodium  Guidelines: Usually 90 to 175 mEq (2000 to 4000 mg), but should be individualized.   Goals: Minimize  complications of fluid imbalance.   Evaluate: Weight, blood pressure regulation, and presence of swelling (edema).  Potassium  Guidelines: Individualized; often not restricted, and may need to be supplemented.   Goals: Serum K+ levels between 4.0 to 5.0 mEq/L.   Evaluate: Serum K+ levels, usual intake of K+, appetite.  Phosphorus  Guidelines: 800 to 1200 mg/day (the high protein intake results in a high obligatory P intake).   Goal: Serum P levels between 4.5 to 6.0 mg/dL.   Evaluate: Serum P levels, usual P intake, P-binding medications: type, number, dosage, distribution.  Fluids  Guidelines: Individualized - may not be restricted for all patients.   Goal: Minimize complications of fluid imbalance.   Evaluate: Weight, blood pressure regulation, sodium intake, and presence of edema.  Document Released: 12/06/2005 Document Revised: 11/25/2011 Document Reviewed: 02/28/2007 [facility] Patient Information 2012 [place].

## 2017-08-31 ENCOUNTER — Encounter (HOSPITAL_COMMUNITY): Payer: Self-pay | Admitting: Surgery

## 2017-08-31 DIAGNOSIS — Z992 Dependence on renal dialysis: Secondary | ICD-10-CM | POA: Diagnosis not present

## 2017-08-31 DIAGNOSIS — D631 Anemia in chronic kidney disease: Secondary | ICD-10-CM | POA: Diagnosis not present

## 2017-08-31 DIAGNOSIS — D509 Iron deficiency anemia, unspecified: Secondary | ICD-10-CM | POA: Diagnosis not present

## 2017-08-31 DIAGNOSIS — Z23 Encounter for immunization: Secondary | ICD-10-CM | POA: Diagnosis not present

## 2017-08-31 DIAGNOSIS — N2581 Secondary hyperparathyroidism of renal origin: Secondary | ICD-10-CM | POA: Diagnosis not present

## 2017-08-31 DIAGNOSIS — N186 End stage renal disease: Secondary | ICD-10-CM | POA: Diagnosis not present

## 2017-09-02 DIAGNOSIS — N2581 Secondary hyperparathyroidism of renal origin: Secondary | ICD-10-CM | POA: Diagnosis not present

## 2017-09-02 DIAGNOSIS — D631 Anemia in chronic kidney disease: Secondary | ICD-10-CM | POA: Diagnosis not present

## 2017-09-02 DIAGNOSIS — Z23 Encounter for immunization: Secondary | ICD-10-CM | POA: Diagnosis not present

## 2017-09-02 DIAGNOSIS — N186 End stage renal disease: Secondary | ICD-10-CM | POA: Diagnosis not present

## 2017-09-02 DIAGNOSIS — D509 Iron deficiency anemia, unspecified: Secondary | ICD-10-CM | POA: Diagnosis not present

## 2017-09-02 DIAGNOSIS — Z992 Dependence on renal dialysis: Secondary | ICD-10-CM | POA: Diagnosis not present

## 2017-09-05 DIAGNOSIS — N186 End stage renal disease: Secondary | ICD-10-CM | POA: Diagnosis not present

## 2017-09-05 DIAGNOSIS — D509 Iron deficiency anemia, unspecified: Secondary | ICD-10-CM | POA: Diagnosis not present

## 2017-09-05 DIAGNOSIS — Z23 Encounter for immunization: Secondary | ICD-10-CM | POA: Diagnosis not present

## 2017-09-05 DIAGNOSIS — D631 Anemia in chronic kidney disease: Secondary | ICD-10-CM | POA: Diagnosis not present

## 2017-09-05 DIAGNOSIS — Z992 Dependence on renal dialysis: Secondary | ICD-10-CM | POA: Diagnosis not present

## 2017-09-05 DIAGNOSIS — N2581 Secondary hyperparathyroidism of renal origin: Secondary | ICD-10-CM | POA: Diagnosis not present

## 2017-09-07 DIAGNOSIS — N186 End stage renal disease: Secondary | ICD-10-CM | POA: Diagnosis not present

## 2017-09-07 DIAGNOSIS — Z992 Dependence on renal dialysis: Secondary | ICD-10-CM | POA: Diagnosis not present

## 2017-09-07 DIAGNOSIS — N2581 Secondary hyperparathyroidism of renal origin: Secondary | ICD-10-CM | POA: Diagnosis not present

## 2017-09-07 DIAGNOSIS — D631 Anemia in chronic kidney disease: Secondary | ICD-10-CM | POA: Diagnosis not present

## 2017-09-07 DIAGNOSIS — Z23 Encounter for immunization: Secondary | ICD-10-CM | POA: Diagnosis not present

## 2017-09-07 DIAGNOSIS — D509 Iron deficiency anemia, unspecified: Secondary | ICD-10-CM | POA: Diagnosis not present

## 2017-09-08 ENCOUNTER — Encounter: Payer: Self-pay | Admitting: Cardiology

## 2017-09-08 ENCOUNTER — Ambulatory Visit: Payer: Medicare Other | Admitting: Cardiology

## 2017-09-09 DIAGNOSIS — N2581 Secondary hyperparathyroidism of renal origin: Secondary | ICD-10-CM | POA: Diagnosis not present

## 2017-09-09 DIAGNOSIS — Z992 Dependence on renal dialysis: Secondary | ICD-10-CM | POA: Diagnosis not present

## 2017-09-09 DIAGNOSIS — Z23 Encounter for immunization: Secondary | ICD-10-CM | POA: Diagnosis not present

## 2017-09-09 DIAGNOSIS — D509 Iron deficiency anemia, unspecified: Secondary | ICD-10-CM | POA: Diagnosis not present

## 2017-09-09 DIAGNOSIS — N186 End stage renal disease: Secondary | ICD-10-CM | POA: Diagnosis not present

## 2017-09-09 DIAGNOSIS — D631 Anemia in chronic kidney disease: Secondary | ICD-10-CM | POA: Diagnosis not present

## 2017-09-12 DIAGNOSIS — N2581 Secondary hyperparathyroidism of renal origin: Secondary | ICD-10-CM | POA: Diagnosis not present

## 2017-09-12 DIAGNOSIS — D509 Iron deficiency anemia, unspecified: Secondary | ICD-10-CM | POA: Diagnosis not present

## 2017-09-12 DIAGNOSIS — Z23 Encounter for immunization: Secondary | ICD-10-CM | POA: Diagnosis not present

## 2017-09-12 DIAGNOSIS — N186 End stage renal disease: Secondary | ICD-10-CM | POA: Diagnosis not present

## 2017-09-12 DIAGNOSIS — D631 Anemia in chronic kidney disease: Secondary | ICD-10-CM | POA: Diagnosis not present

## 2017-09-12 DIAGNOSIS — Z992 Dependence on renal dialysis: Secondary | ICD-10-CM | POA: Diagnosis not present

## 2017-09-16 DIAGNOSIS — N2581 Secondary hyperparathyroidism of renal origin: Secondary | ICD-10-CM | POA: Diagnosis not present

## 2017-09-16 DIAGNOSIS — Z992 Dependence on renal dialysis: Secondary | ICD-10-CM | POA: Diagnosis not present

## 2017-09-16 DIAGNOSIS — D631 Anemia in chronic kidney disease: Secondary | ICD-10-CM | POA: Diagnosis not present

## 2017-09-16 DIAGNOSIS — N186 End stage renal disease: Secondary | ICD-10-CM | POA: Diagnosis not present

## 2017-09-16 DIAGNOSIS — D509 Iron deficiency anemia, unspecified: Secondary | ICD-10-CM | POA: Diagnosis not present

## 2017-09-16 DIAGNOSIS — Z23 Encounter for immunization: Secondary | ICD-10-CM | POA: Diagnosis not present

## 2017-09-18 DIAGNOSIS — N186 End stage renal disease: Secondary | ICD-10-CM | POA: Diagnosis not present

## 2017-09-18 DIAGNOSIS — Z992 Dependence on renal dialysis: Secondary | ICD-10-CM | POA: Diagnosis not present

## 2017-09-19 DIAGNOSIS — D631 Anemia in chronic kidney disease: Secondary | ICD-10-CM | POA: Diagnosis not present

## 2017-09-19 DIAGNOSIS — R1110 Vomiting, unspecified: Secondary | ICD-10-CM | POA: Diagnosis not present

## 2017-09-19 DIAGNOSIS — N2581 Secondary hyperparathyroidism of renal origin: Secondary | ICD-10-CM | POA: Diagnosis not present

## 2017-09-19 DIAGNOSIS — D509 Iron deficiency anemia, unspecified: Secondary | ICD-10-CM | POA: Diagnosis not present

## 2017-09-19 DIAGNOSIS — R110 Nausea: Secondary | ICD-10-CM | POA: Diagnosis not present

## 2017-09-19 DIAGNOSIS — N186 End stage renal disease: Secondary | ICD-10-CM | POA: Diagnosis not present

## 2017-09-19 DIAGNOSIS — Z23 Encounter for immunization: Secondary | ICD-10-CM | POA: Diagnosis not present

## 2017-09-19 DIAGNOSIS — Z992 Dependence on renal dialysis: Secondary | ICD-10-CM | POA: Diagnosis not present

## 2017-09-21 DIAGNOSIS — N186 End stage renal disease: Secondary | ICD-10-CM | POA: Diagnosis not present

## 2017-09-21 DIAGNOSIS — N2581 Secondary hyperparathyroidism of renal origin: Secondary | ICD-10-CM | POA: Diagnosis not present

## 2017-09-21 DIAGNOSIS — D509 Iron deficiency anemia, unspecified: Secondary | ICD-10-CM | POA: Diagnosis not present

## 2017-09-21 DIAGNOSIS — R110 Nausea: Secondary | ICD-10-CM | POA: Diagnosis not present

## 2017-09-21 DIAGNOSIS — D631 Anemia in chronic kidney disease: Secondary | ICD-10-CM | POA: Diagnosis not present

## 2017-09-21 DIAGNOSIS — Z23 Encounter for immunization: Secondary | ICD-10-CM | POA: Diagnosis not present

## 2017-09-23 DIAGNOSIS — D509 Iron deficiency anemia, unspecified: Secondary | ICD-10-CM | POA: Diagnosis not present

## 2017-09-23 DIAGNOSIS — N2581 Secondary hyperparathyroidism of renal origin: Secondary | ICD-10-CM | POA: Diagnosis not present

## 2017-09-23 DIAGNOSIS — D631 Anemia in chronic kidney disease: Secondary | ICD-10-CM | POA: Diagnosis not present

## 2017-09-23 DIAGNOSIS — N186 End stage renal disease: Secondary | ICD-10-CM | POA: Diagnosis not present

## 2017-09-23 DIAGNOSIS — Z23 Encounter for immunization: Secondary | ICD-10-CM | POA: Diagnosis not present

## 2017-09-23 DIAGNOSIS — R110 Nausea: Secondary | ICD-10-CM | POA: Diagnosis not present

## 2017-09-26 DIAGNOSIS — Z992 Dependence on renal dialysis: Secondary | ICD-10-CM | POA: Diagnosis not present

## 2017-09-26 DIAGNOSIS — D631 Anemia in chronic kidney disease: Secondary | ICD-10-CM | POA: Diagnosis not present

## 2017-09-26 DIAGNOSIS — N2581 Secondary hyperparathyroidism of renal origin: Secondary | ICD-10-CM | POA: Diagnosis not present

## 2017-09-26 DIAGNOSIS — D509 Iron deficiency anemia, unspecified: Secondary | ICD-10-CM | POA: Diagnosis not present

## 2017-09-26 DIAGNOSIS — Z23 Encounter for immunization: Secondary | ICD-10-CM | POA: Diagnosis not present

## 2017-09-26 DIAGNOSIS — N186 End stage renal disease: Secondary | ICD-10-CM | POA: Diagnosis not present

## 2017-09-26 DIAGNOSIS — R110 Nausea: Secondary | ICD-10-CM | POA: Diagnosis not present

## 2017-09-28 DIAGNOSIS — Z23 Encounter for immunization: Secondary | ICD-10-CM | POA: Diagnosis not present

## 2017-09-28 DIAGNOSIS — D631 Anemia in chronic kidney disease: Secondary | ICD-10-CM | POA: Diagnosis not present

## 2017-09-28 DIAGNOSIS — N2581 Secondary hyperparathyroidism of renal origin: Secondary | ICD-10-CM | POA: Diagnosis not present

## 2017-09-28 DIAGNOSIS — D509 Iron deficiency anemia, unspecified: Secondary | ICD-10-CM | POA: Diagnosis not present

## 2017-09-28 DIAGNOSIS — R110 Nausea: Secondary | ICD-10-CM | POA: Diagnosis not present

## 2017-09-28 DIAGNOSIS — N186 End stage renal disease: Secondary | ICD-10-CM | POA: Diagnosis not present

## 2017-10-01 DIAGNOSIS — N186 End stage renal disease: Secondary | ICD-10-CM | POA: Diagnosis not present

## 2017-10-01 DIAGNOSIS — D509 Iron deficiency anemia, unspecified: Secondary | ICD-10-CM | POA: Diagnosis not present

## 2017-10-01 DIAGNOSIS — R110 Nausea: Secondary | ICD-10-CM | POA: Diagnosis not present

## 2017-10-01 DIAGNOSIS — D631 Anemia in chronic kidney disease: Secondary | ICD-10-CM | POA: Diagnosis not present

## 2017-10-01 DIAGNOSIS — Z23 Encounter for immunization: Secondary | ICD-10-CM | POA: Diagnosis not present

## 2017-10-01 DIAGNOSIS — N2581 Secondary hyperparathyroidism of renal origin: Secondary | ICD-10-CM | POA: Diagnosis not present

## 2017-10-03 DIAGNOSIS — N186 End stage renal disease: Secondary | ICD-10-CM | POA: Diagnosis not present

## 2017-10-03 DIAGNOSIS — N2581 Secondary hyperparathyroidism of renal origin: Secondary | ICD-10-CM | POA: Diagnosis not present

## 2017-10-03 DIAGNOSIS — Z23 Encounter for immunization: Secondary | ICD-10-CM | POA: Diagnosis not present

## 2017-10-03 DIAGNOSIS — D509 Iron deficiency anemia, unspecified: Secondary | ICD-10-CM | POA: Diagnosis not present

## 2017-10-03 DIAGNOSIS — D631 Anemia in chronic kidney disease: Secondary | ICD-10-CM | POA: Diagnosis not present

## 2017-10-03 DIAGNOSIS — R110 Nausea: Secondary | ICD-10-CM | POA: Diagnosis not present

## 2017-10-05 DIAGNOSIS — N2581 Secondary hyperparathyroidism of renal origin: Secondary | ICD-10-CM | POA: Diagnosis not present

## 2017-10-05 DIAGNOSIS — D631 Anemia in chronic kidney disease: Secondary | ICD-10-CM | POA: Diagnosis not present

## 2017-10-05 DIAGNOSIS — R110 Nausea: Secondary | ICD-10-CM | POA: Diagnosis not present

## 2017-10-05 DIAGNOSIS — D509 Iron deficiency anemia, unspecified: Secondary | ICD-10-CM | POA: Diagnosis not present

## 2017-10-05 DIAGNOSIS — Z23 Encounter for immunization: Secondary | ICD-10-CM | POA: Diagnosis not present

## 2017-10-05 DIAGNOSIS — N186 End stage renal disease: Secondary | ICD-10-CM | POA: Diagnosis not present

## 2017-10-07 DIAGNOSIS — D631 Anemia in chronic kidney disease: Secondary | ICD-10-CM | POA: Diagnosis not present

## 2017-10-07 DIAGNOSIS — R110 Nausea: Secondary | ICD-10-CM | POA: Diagnosis not present

## 2017-10-07 DIAGNOSIS — N186 End stage renal disease: Secondary | ICD-10-CM | POA: Diagnosis not present

## 2017-10-07 DIAGNOSIS — N2581 Secondary hyperparathyroidism of renal origin: Secondary | ICD-10-CM | POA: Diagnosis not present

## 2017-10-07 DIAGNOSIS — D509 Iron deficiency anemia, unspecified: Secondary | ICD-10-CM | POA: Diagnosis not present

## 2017-10-07 DIAGNOSIS — Z23 Encounter for immunization: Secondary | ICD-10-CM | POA: Diagnosis not present

## 2017-10-10 DIAGNOSIS — N2581 Secondary hyperparathyroidism of renal origin: Secondary | ICD-10-CM | POA: Diagnosis not present

## 2017-10-10 DIAGNOSIS — Z23 Encounter for immunization: Secondary | ICD-10-CM | POA: Diagnosis not present

## 2017-10-10 DIAGNOSIS — D631 Anemia in chronic kidney disease: Secondary | ICD-10-CM | POA: Diagnosis not present

## 2017-10-10 DIAGNOSIS — D509 Iron deficiency anemia, unspecified: Secondary | ICD-10-CM | POA: Diagnosis not present

## 2017-10-10 DIAGNOSIS — N186 End stage renal disease: Secondary | ICD-10-CM | POA: Diagnosis not present

## 2017-10-10 DIAGNOSIS — R110 Nausea: Secondary | ICD-10-CM | POA: Diagnosis not present

## 2017-10-12 DIAGNOSIS — D631 Anemia in chronic kidney disease: Secondary | ICD-10-CM | POA: Diagnosis not present

## 2017-10-12 DIAGNOSIS — R110 Nausea: Secondary | ICD-10-CM | POA: Diagnosis not present

## 2017-10-12 DIAGNOSIS — N186 End stage renal disease: Secondary | ICD-10-CM | POA: Diagnosis not present

## 2017-10-12 DIAGNOSIS — N2581 Secondary hyperparathyroidism of renal origin: Secondary | ICD-10-CM | POA: Diagnosis not present

## 2017-10-12 DIAGNOSIS — D509 Iron deficiency anemia, unspecified: Secondary | ICD-10-CM | POA: Diagnosis not present

## 2017-10-12 DIAGNOSIS — Z23 Encounter for immunization: Secondary | ICD-10-CM | POA: Diagnosis not present

## 2017-10-14 DIAGNOSIS — D509 Iron deficiency anemia, unspecified: Secondary | ICD-10-CM | POA: Diagnosis not present

## 2017-10-14 DIAGNOSIS — N186 End stage renal disease: Secondary | ICD-10-CM | POA: Diagnosis not present

## 2017-10-14 DIAGNOSIS — D631 Anemia in chronic kidney disease: Secondary | ICD-10-CM | POA: Diagnosis not present

## 2017-10-14 DIAGNOSIS — Z23 Encounter for immunization: Secondary | ICD-10-CM | POA: Diagnosis not present

## 2017-10-14 DIAGNOSIS — N2581 Secondary hyperparathyroidism of renal origin: Secondary | ICD-10-CM | POA: Diagnosis not present

## 2017-10-14 DIAGNOSIS — R110 Nausea: Secondary | ICD-10-CM | POA: Diagnosis not present

## 2017-10-17 DIAGNOSIS — N186 End stage renal disease: Secondary | ICD-10-CM | POA: Diagnosis not present

## 2017-10-17 DIAGNOSIS — N2581 Secondary hyperparathyroidism of renal origin: Secondary | ICD-10-CM | POA: Diagnosis not present

## 2017-10-17 DIAGNOSIS — D509 Iron deficiency anemia, unspecified: Secondary | ICD-10-CM | POA: Diagnosis not present

## 2017-10-17 DIAGNOSIS — Z23 Encounter for immunization: Secondary | ICD-10-CM | POA: Diagnosis not present

## 2017-10-17 DIAGNOSIS — D631 Anemia in chronic kidney disease: Secondary | ICD-10-CM | POA: Diagnosis not present

## 2017-10-17 DIAGNOSIS — R110 Nausea: Secondary | ICD-10-CM | POA: Diagnosis not present

## 2017-10-19 DIAGNOSIS — Z992 Dependence on renal dialysis: Secondary | ICD-10-CM | POA: Diagnosis not present

## 2017-10-19 DIAGNOSIS — R110 Nausea: Secondary | ICD-10-CM | POA: Diagnosis not present

## 2017-10-19 DIAGNOSIS — D631 Anemia in chronic kidney disease: Secondary | ICD-10-CM | POA: Diagnosis not present

## 2017-10-19 DIAGNOSIS — N2581 Secondary hyperparathyroidism of renal origin: Secondary | ICD-10-CM | POA: Diagnosis not present

## 2017-10-19 DIAGNOSIS — Z23 Encounter for immunization: Secondary | ICD-10-CM | POA: Diagnosis not present

## 2017-10-19 DIAGNOSIS — N186 End stage renal disease: Secondary | ICD-10-CM | POA: Diagnosis not present

## 2017-10-19 DIAGNOSIS — D509 Iron deficiency anemia, unspecified: Secondary | ICD-10-CM | POA: Diagnosis not present

## 2017-10-21 DIAGNOSIS — D631 Anemia in chronic kidney disease: Secondary | ICD-10-CM | POA: Diagnosis not present

## 2017-10-21 DIAGNOSIS — Z992 Dependence on renal dialysis: Secondary | ICD-10-CM | POA: Diagnosis not present

## 2017-10-21 DIAGNOSIS — D509 Iron deficiency anemia, unspecified: Secondary | ICD-10-CM | POA: Diagnosis not present

## 2017-10-21 DIAGNOSIS — N2581 Secondary hyperparathyroidism of renal origin: Secondary | ICD-10-CM | POA: Diagnosis not present

## 2017-10-21 DIAGNOSIS — N186 End stage renal disease: Secondary | ICD-10-CM | POA: Diagnosis not present

## 2017-10-24 DIAGNOSIS — D509 Iron deficiency anemia, unspecified: Secondary | ICD-10-CM | POA: Diagnosis not present

## 2017-10-24 DIAGNOSIS — N2581 Secondary hyperparathyroidism of renal origin: Secondary | ICD-10-CM | POA: Diagnosis not present

## 2017-10-24 DIAGNOSIS — D631 Anemia in chronic kidney disease: Secondary | ICD-10-CM | POA: Diagnosis not present

## 2017-10-24 DIAGNOSIS — Z992 Dependence on renal dialysis: Secondary | ICD-10-CM | POA: Diagnosis not present

## 2017-10-24 DIAGNOSIS — N186 End stage renal disease: Secondary | ICD-10-CM | POA: Diagnosis not present

## 2017-10-26 DIAGNOSIS — Z992 Dependence on renal dialysis: Secondary | ICD-10-CM | POA: Diagnosis not present

## 2017-10-26 DIAGNOSIS — D509 Iron deficiency anemia, unspecified: Secondary | ICD-10-CM | POA: Diagnosis not present

## 2017-10-26 DIAGNOSIS — N186 End stage renal disease: Secondary | ICD-10-CM | POA: Diagnosis not present

## 2017-10-26 DIAGNOSIS — D631 Anemia in chronic kidney disease: Secondary | ICD-10-CM | POA: Diagnosis not present

## 2017-10-26 DIAGNOSIS — N2581 Secondary hyperparathyroidism of renal origin: Secondary | ICD-10-CM | POA: Diagnosis not present

## 2017-10-28 DIAGNOSIS — N2581 Secondary hyperparathyroidism of renal origin: Secondary | ICD-10-CM | POA: Diagnosis not present

## 2017-10-28 DIAGNOSIS — Z992 Dependence on renal dialysis: Secondary | ICD-10-CM | POA: Diagnosis not present

## 2017-10-28 DIAGNOSIS — D631 Anemia in chronic kidney disease: Secondary | ICD-10-CM | POA: Diagnosis not present

## 2017-10-28 DIAGNOSIS — D509 Iron deficiency anemia, unspecified: Secondary | ICD-10-CM | POA: Diagnosis not present

## 2017-10-28 DIAGNOSIS — N186 End stage renal disease: Secondary | ICD-10-CM | POA: Diagnosis not present

## 2017-10-31 DIAGNOSIS — N2581 Secondary hyperparathyroidism of renal origin: Secondary | ICD-10-CM | POA: Diagnosis not present

## 2017-10-31 DIAGNOSIS — N186 End stage renal disease: Secondary | ICD-10-CM | POA: Diagnosis not present

## 2017-10-31 DIAGNOSIS — Z992 Dependence on renal dialysis: Secondary | ICD-10-CM | POA: Diagnosis not present

## 2017-10-31 DIAGNOSIS — D631 Anemia in chronic kidney disease: Secondary | ICD-10-CM | POA: Diagnosis not present

## 2017-10-31 DIAGNOSIS — D509 Iron deficiency anemia, unspecified: Secondary | ICD-10-CM | POA: Diagnosis not present

## 2017-11-02 DIAGNOSIS — N186 End stage renal disease: Secondary | ICD-10-CM | POA: Diagnosis not present

## 2017-11-02 DIAGNOSIS — D631 Anemia in chronic kidney disease: Secondary | ICD-10-CM | POA: Diagnosis not present

## 2017-11-02 DIAGNOSIS — Z992 Dependence on renal dialysis: Secondary | ICD-10-CM | POA: Diagnosis not present

## 2017-11-02 DIAGNOSIS — D509 Iron deficiency anemia, unspecified: Secondary | ICD-10-CM | POA: Diagnosis not present

## 2017-11-02 DIAGNOSIS — N2581 Secondary hyperparathyroidism of renal origin: Secondary | ICD-10-CM | POA: Diagnosis not present

## 2017-11-04 DIAGNOSIS — D509 Iron deficiency anemia, unspecified: Secondary | ICD-10-CM | POA: Diagnosis not present

## 2017-11-04 DIAGNOSIS — N186 End stage renal disease: Secondary | ICD-10-CM | POA: Diagnosis not present

## 2017-11-04 DIAGNOSIS — D631 Anemia in chronic kidney disease: Secondary | ICD-10-CM | POA: Diagnosis not present

## 2017-11-04 DIAGNOSIS — Z992 Dependence on renal dialysis: Secondary | ICD-10-CM | POA: Diagnosis not present

## 2017-11-04 DIAGNOSIS — N2581 Secondary hyperparathyroidism of renal origin: Secondary | ICD-10-CM | POA: Diagnosis not present

## 2017-11-07 DIAGNOSIS — D509 Iron deficiency anemia, unspecified: Secondary | ICD-10-CM | POA: Diagnosis not present

## 2017-11-07 DIAGNOSIS — Z992 Dependence on renal dialysis: Secondary | ICD-10-CM | POA: Diagnosis not present

## 2017-11-07 DIAGNOSIS — N186 End stage renal disease: Secondary | ICD-10-CM | POA: Diagnosis not present

## 2017-11-07 DIAGNOSIS — N2581 Secondary hyperparathyroidism of renal origin: Secondary | ICD-10-CM | POA: Diagnosis not present

## 2017-11-07 DIAGNOSIS — D631 Anemia in chronic kidney disease: Secondary | ICD-10-CM | POA: Diagnosis not present

## 2017-11-09 DIAGNOSIS — D631 Anemia in chronic kidney disease: Secondary | ICD-10-CM | POA: Diagnosis not present

## 2017-11-09 DIAGNOSIS — Z992 Dependence on renal dialysis: Secondary | ICD-10-CM | POA: Diagnosis not present

## 2017-11-09 DIAGNOSIS — D509 Iron deficiency anemia, unspecified: Secondary | ICD-10-CM | POA: Diagnosis not present

## 2017-11-09 DIAGNOSIS — N186 End stage renal disease: Secondary | ICD-10-CM | POA: Diagnosis not present

## 2017-11-09 DIAGNOSIS — N2581 Secondary hyperparathyroidism of renal origin: Secondary | ICD-10-CM | POA: Diagnosis not present

## 2017-11-11 DIAGNOSIS — N2581 Secondary hyperparathyroidism of renal origin: Secondary | ICD-10-CM | POA: Diagnosis not present

## 2017-11-11 DIAGNOSIS — D509 Iron deficiency anemia, unspecified: Secondary | ICD-10-CM | POA: Diagnosis not present

## 2017-11-11 DIAGNOSIS — N186 End stage renal disease: Secondary | ICD-10-CM | POA: Diagnosis not present

## 2017-11-11 DIAGNOSIS — D631 Anemia in chronic kidney disease: Secondary | ICD-10-CM | POA: Diagnosis not present

## 2017-11-11 DIAGNOSIS — Z992 Dependence on renal dialysis: Secondary | ICD-10-CM | POA: Diagnosis not present

## 2017-11-14 DIAGNOSIS — Z992 Dependence on renal dialysis: Secondary | ICD-10-CM | POA: Diagnosis not present

## 2017-11-14 DIAGNOSIS — N2581 Secondary hyperparathyroidism of renal origin: Secondary | ICD-10-CM | POA: Diagnosis not present

## 2017-11-14 DIAGNOSIS — N186 End stage renal disease: Secondary | ICD-10-CM | POA: Diagnosis not present

## 2017-11-14 DIAGNOSIS — D631 Anemia in chronic kidney disease: Secondary | ICD-10-CM | POA: Diagnosis not present

## 2017-11-14 DIAGNOSIS — D509 Iron deficiency anemia, unspecified: Secondary | ICD-10-CM | POA: Diagnosis not present

## 2017-11-16 DIAGNOSIS — Z992 Dependence on renal dialysis: Secondary | ICD-10-CM | POA: Diagnosis not present

## 2017-11-16 DIAGNOSIS — D631 Anemia in chronic kidney disease: Secondary | ICD-10-CM | POA: Diagnosis not present

## 2017-11-16 DIAGNOSIS — D509 Iron deficiency anemia, unspecified: Secondary | ICD-10-CM | POA: Diagnosis not present

## 2017-11-16 DIAGNOSIS — N186 End stage renal disease: Secondary | ICD-10-CM | POA: Diagnosis not present

## 2017-11-16 DIAGNOSIS — N2581 Secondary hyperparathyroidism of renal origin: Secondary | ICD-10-CM | POA: Diagnosis not present

## 2017-11-18 DIAGNOSIS — N186 End stage renal disease: Secondary | ICD-10-CM | POA: Diagnosis not present

## 2017-11-18 DIAGNOSIS — Z992 Dependence on renal dialysis: Secondary | ICD-10-CM | POA: Diagnosis not present

## 2017-11-18 DIAGNOSIS — D631 Anemia in chronic kidney disease: Secondary | ICD-10-CM | POA: Diagnosis not present

## 2017-11-18 DIAGNOSIS — N2581 Secondary hyperparathyroidism of renal origin: Secondary | ICD-10-CM | POA: Diagnosis not present

## 2017-11-18 DIAGNOSIS — D509 Iron deficiency anemia, unspecified: Secondary | ICD-10-CM | POA: Diagnosis not present

## 2017-11-21 DIAGNOSIS — N186 End stage renal disease: Secondary | ICD-10-CM | POA: Diagnosis not present

## 2017-11-21 DIAGNOSIS — Z992 Dependence on renal dialysis: Secondary | ICD-10-CM | POA: Diagnosis not present

## 2017-11-21 DIAGNOSIS — D509 Iron deficiency anemia, unspecified: Secondary | ICD-10-CM | POA: Diagnosis not present

## 2017-11-21 DIAGNOSIS — D631 Anemia in chronic kidney disease: Secondary | ICD-10-CM | POA: Diagnosis not present

## 2017-11-21 DIAGNOSIS — N2581 Secondary hyperparathyroidism of renal origin: Secondary | ICD-10-CM | POA: Diagnosis not present

## 2017-11-23 DIAGNOSIS — Z992 Dependence on renal dialysis: Secondary | ICD-10-CM | POA: Diagnosis not present

## 2017-11-23 DIAGNOSIS — N186 End stage renal disease: Secondary | ICD-10-CM | POA: Diagnosis not present

## 2017-11-23 DIAGNOSIS — D509 Iron deficiency anemia, unspecified: Secondary | ICD-10-CM | POA: Diagnosis not present

## 2017-11-23 DIAGNOSIS — D631 Anemia in chronic kidney disease: Secondary | ICD-10-CM | POA: Diagnosis not present

## 2017-11-23 DIAGNOSIS — N2581 Secondary hyperparathyroidism of renal origin: Secondary | ICD-10-CM | POA: Diagnosis not present

## 2017-11-25 DIAGNOSIS — Z992 Dependence on renal dialysis: Secondary | ICD-10-CM | POA: Diagnosis not present

## 2017-11-25 DIAGNOSIS — D631 Anemia in chronic kidney disease: Secondary | ICD-10-CM | POA: Diagnosis not present

## 2017-11-25 DIAGNOSIS — N186 End stage renal disease: Secondary | ICD-10-CM | POA: Diagnosis not present

## 2017-11-25 DIAGNOSIS — N2581 Secondary hyperparathyroidism of renal origin: Secondary | ICD-10-CM | POA: Diagnosis not present

## 2017-11-25 DIAGNOSIS — D509 Iron deficiency anemia, unspecified: Secondary | ICD-10-CM | POA: Diagnosis not present

## 2017-11-30 DIAGNOSIS — D631 Anemia in chronic kidney disease: Secondary | ICD-10-CM | POA: Diagnosis not present

## 2017-11-30 DIAGNOSIS — D509 Iron deficiency anemia, unspecified: Secondary | ICD-10-CM | POA: Diagnosis not present

## 2017-11-30 DIAGNOSIS — N2581 Secondary hyperparathyroidism of renal origin: Secondary | ICD-10-CM | POA: Diagnosis not present

## 2017-11-30 DIAGNOSIS — N186 End stage renal disease: Secondary | ICD-10-CM | POA: Diagnosis not present

## 2017-11-30 DIAGNOSIS — Z992 Dependence on renal dialysis: Secondary | ICD-10-CM | POA: Diagnosis not present

## 2017-12-02 DIAGNOSIS — D631 Anemia in chronic kidney disease: Secondary | ICD-10-CM | POA: Diagnosis not present

## 2017-12-02 DIAGNOSIS — D509 Iron deficiency anemia, unspecified: Secondary | ICD-10-CM | POA: Diagnosis not present

## 2017-12-02 DIAGNOSIS — Z992 Dependence on renal dialysis: Secondary | ICD-10-CM | POA: Diagnosis not present

## 2017-12-02 DIAGNOSIS — N186 End stage renal disease: Secondary | ICD-10-CM | POA: Diagnosis not present

## 2017-12-02 DIAGNOSIS — N2581 Secondary hyperparathyroidism of renal origin: Secondary | ICD-10-CM | POA: Diagnosis not present

## 2017-12-05 DIAGNOSIS — N2581 Secondary hyperparathyroidism of renal origin: Secondary | ICD-10-CM | POA: Diagnosis not present

## 2017-12-05 DIAGNOSIS — N186 End stage renal disease: Secondary | ICD-10-CM | POA: Diagnosis not present

## 2017-12-05 DIAGNOSIS — D509 Iron deficiency anemia, unspecified: Secondary | ICD-10-CM | POA: Diagnosis not present

## 2017-12-05 DIAGNOSIS — Z992 Dependence on renal dialysis: Secondary | ICD-10-CM | POA: Diagnosis not present

## 2017-12-05 DIAGNOSIS — D631 Anemia in chronic kidney disease: Secondary | ICD-10-CM | POA: Diagnosis not present

## 2017-12-06 DIAGNOSIS — Z4902 Encounter for fitting and adjustment of peritoneal dialysis catheter: Secondary | ICD-10-CM | POA: Diagnosis not present

## 2017-12-06 DIAGNOSIS — T85691A Other mechanical complication of intraperitoneal dialysis catheter, initial encounter: Secondary | ICD-10-CM | POA: Diagnosis not present

## 2017-12-07 DIAGNOSIS — N186 End stage renal disease: Secondary | ICD-10-CM | POA: Diagnosis not present

## 2017-12-07 DIAGNOSIS — D631 Anemia in chronic kidney disease: Secondary | ICD-10-CM | POA: Diagnosis not present

## 2017-12-07 DIAGNOSIS — N2581 Secondary hyperparathyroidism of renal origin: Secondary | ICD-10-CM | POA: Diagnosis not present

## 2017-12-07 DIAGNOSIS — D509 Iron deficiency anemia, unspecified: Secondary | ICD-10-CM | POA: Diagnosis not present

## 2017-12-07 DIAGNOSIS — Z992 Dependence on renal dialysis: Secondary | ICD-10-CM | POA: Diagnosis not present

## 2017-12-09 DIAGNOSIS — Z992 Dependence on renal dialysis: Secondary | ICD-10-CM | POA: Diagnosis not present

## 2017-12-09 DIAGNOSIS — D631 Anemia in chronic kidney disease: Secondary | ICD-10-CM | POA: Diagnosis not present

## 2017-12-09 DIAGNOSIS — N2581 Secondary hyperparathyroidism of renal origin: Secondary | ICD-10-CM | POA: Diagnosis not present

## 2017-12-09 DIAGNOSIS — N186 End stage renal disease: Secondary | ICD-10-CM | POA: Diagnosis not present

## 2017-12-09 DIAGNOSIS — D509 Iron deficiency anemia, unspecified: Secondary | ICD-10-CM | POA: Diagnosis not present

## 2017-12-11 DIAGNOSIS — N186 End stage renal disease: Secondary | ICD-10-CM | POA: Diagnosis not present

## 2017-12-11 DIAGNOSIS — D631 Anemia in chronic kidney disease: Secondary | ICD-10-CM | POA: Diagnosis not present

## 2017-12-11 DIAGNOSIS — N2581 Secondary hyperparathyroidism of renal origin: Secondary | ICD-10-CM | POA: Diagnosis not present

## 2017-12-11 DIAGNOSIS — D509 Iron deficiency anemia, unspecified: Secondary | ICD-10-CM | POA: Diagnosis not present

## 2017-12-11 DIAGNOSIS — Z992 Dependence on renal dialysis: Secondary | ICD-10-CM | POA: Diagnosis not present

## 2017-12-14 DIAGNOSIS — D631 Anemia in chronic kidney disease: Secondary | ICD-10-CM | POA: Diagnosis not present

## 2017-12-14 DIAGNOSIS — Z992 Dependence on renal dialysis: Secondary | ICD-10-CM | POA: Diagnosis not present

## 2017-12-14 DIAGNOSIS — N2581 Secondary hyperparathyroidism of renal origin: Secondary | ICD-10-CM | POA: Diagnosis not present

## 2017-12-14 DIAGNOSIS — D509 Iron deficiency anemia, unspecified: Secondary | ICD-10-CM | POA: Diagnosis not present

## 2017-12-14 DIAGNOSIS — N186 End stage renal disease: Secondary | ICD-10-CM | POA: Diagnosis not present

## 2017-12-16 DIAGNOSIS — N186 End stage renal disease: Secondary | ICD-10-CM | POA: Diagnosis not present

## 2017-12-16 DIAGNOSIS — Z992 Dependence on renal dialysis: Secondary | ICD-10-CM | POA: Diagnosis not present

## 2017-12-16 DIAGNOSIS — N2581 Secondary hyperparathyroidism of renal origin: Secondary | ICD-10-CM | POA: Diagnosis not present

## 2017-12-16 DIAGNOSIS — D509 Iron deficiency anemia, unspecified: Secondary | ICD-10-CM | POA: Diagnosis not present

## 2017-12-16 DIAGNOSIS — D631 Anemia in chronic kidney disease: Secondary | ICD-10-CM | POA: Diagnosis not present

## 2017-12-19 DIAGNOSIS — N186 End stage renal disease: Secondary | ICD-10-CM | POA: Diagnosis not present

## 2017-12-19 DIAGNOSIS — D631 Anemia in chronic kidney disease: Secondary | ICD-10-CM | POA: Diagnosis not present

## 2017-12-19 DIAGNOSIS — N2581 Secondary hyperparathyroidism of renal origin: Secondary | ICD-10-CM | POA: Diagnosis not present

## 2017-12-19 DIAGNOSIS — Z992 Dependence on renal dialysis: Secondary | ICD-10-CM | POA: Diagnosis not present

## 2017-12-19 DIAGNOSIS — D509 Iron deficiency anemia, unspecified: Secondary | ICD-10-CM | POA: Diagnosis not present

## 2017-12-21 DIAGNOSIS — Z992 Dependence on renal dialysis: Secondary | ICD-10-CM | POA: Diagnosis not present

## 2017-12-21 DIAGNOSIS — N2581 Secondary hyperparathyroidism of renal origin: Secondary | ICD-10-CM | POA: Diagnosis not present

## 2017-12-21 DIAGNOSIS — D631 Anemia in chronic kidney disease: Secondary | ICD-10-CM | POA: Diagnosis not present

## 2017-12-21 DIAGNOSIS — N186 End stage renal disease: Secondary | ICD-10-CM | POA: Diagnosis not present

## 2017-12-21 DIAGNOSIS — D509 Iron deficiency anemia, unspecified: Secondary | ICD-10-CM | POA: Diagnosis not present

## 2017-12-21 DIAGNOSIS — R1110 Vomiting, unspecified: Secondary | ICD-10-CM | POA: Diagnosis not present

## 2017-12-21 DIAGNOSIS — R110 Nausea: Secondary | ICD-10-CM | POA: Diagnosis not present

## 2017-12-23 DIAGNOSIS — N186 End stage renal disease: Secondary | ICD-10-CM | POA: Diagnosis not present

## 2017-12-23 DIAGNOSIS — Z992 Dependence on renal dialysis: Secondary | ICD-10-CM | POA: Diagnosis not present

## 2017-12-23 DIAGNOSIS — N2581 Secondary hyperparathyroidism of renal origin: Secondary | ICD-10-CM | POA: Diagnosis not present

## 2017-12-23 DIAGNOSIS — D631 Anemia in chronic kidney disease: Secondary | ICD-10-CM | POA: Diagnosis not present

## 2017-12-23 DIAGNOSIS — R110 Nausea: Secondary | ICD-10-CM | POA: Diagnosis not present

## 2017-12-23 DIAGNOSIS — D509 Iron deficiency anemia, unspecified: Secondary | ICD-10-CM | POA: Diagnosis not present

## 2017-12-26 DIAGNOSIS — D631 Anemia in chronic kidney disease: Secondary | ICD-10-CM | POA: Diagnosis not present

## 2017-12-26 DIAGNOSIS — N2581 Secondary hyperparathyroidism of renal origin: Secondary | ICD-10-CM | POA: Diagnosis not present

## 2017-12-26 DIAGNOSIS — N186 End stage renal disease: Secondary | ICD-10-CM | POA: Diagnosis not present

## 2017-12-26 DIAGNOSIS — D509 Iron deficiency anemia, unspecified: Secondary | ICD-10-CM | POA: Diagnosis not present

## 2017-12-26 DIAGNOSIS — R110 Nausea: Secondary | ICD-10-CM | POA: Diagnosis not present

## 2017-12-26 DIAGNOSIS — Z992 Dependence on renal dialysis: Secondary | ICD-10-CM | POA: Diagnosis not present

## 2017-12-28 DIAGNOSIS — Z992 Dependence on renal dialysis: Secondary | ICD-10-CM | POA: Diagnosis not present

## 2017-12-28 DIAGNOSIS — N186 End stage renal disease: Secondary | ICD-10-CM | POA: Diagnosis not present

## 2017-12-29 DIAGNOSIS — R110 Nausea: Secondary | ICD-10-CM | POA: Diagnosis not present

## 2017-12-29 DIAGNOSIS — Z992 Dependence on renal dialysis: Secondary | ICD-10-CM | POA: Diagnosis not present

## 2017-12-29 DIAGNOSIS — D631 Anemia in chronic kidney disease: Secondary | ICD-10-CM | POA: Diagnosis not present

## 2017-12-29 DIAGNOSIS — N2581 Secondary hyperparathyroidism of renal origin: Secondary | ICD-10-CM | POA: Diagnosis not present

## 2017-12-29 DIAGNOSIS — N186 End stage renal disease: Secondary | ICD-10-CM | POA: Diagnosis not present

## 2017-12-29 DIAGNOSIS — D509 Iron deficiency anemia, unspecified: Secondary | ICD-10-CM | POA: Diagnosis not present

## 2017-12-30 DIAGNOSIS — D631 Anemia in chronic kidney disease: Secondary | ICD-10-CM | POA: Diagnosis not present

## 2017-12-30 DIAGNOSIS — R110 Nausea: Secondary | ICD-10-CM | POA: Diagnosis not present

## 2017-12-30 DIAGNOSIS — Z992 Dependence on renal dialysis: Secondary | ICD-10-CM | POA: Diagnosis not present

## 2017-12-30 DIAGNOSIS — N2581 Secondary hyperparathyroidism of renal origin: Secondary | ICD-10-CM | POA: Diagnosis not present

## 2017-12-30 DIAGNOSIS — N186 End stage renal disease: Secondary | ICD-10-CM | POA: Diagnosis not present

## 2017-12-30 DIAGNOSIS — D509 Iron deficiency anemia, unspecified: Secondary | ICD-10-CM | POA: Diagnosis not present

## 2018-01-02 DIAGNOSIS — R110 Nausea: Secondary | ICD-10-CM | POA: Diagnosis not present

## 2018-01-02 DIAGNOSIS — N186 End stage renal disease: Secondary | ICD-10-CM | POA: Diagnosis not present

## 2018-01-02 DIAGNOSIS — D631 Anemia in chronic kidney disease: Secondary | ICD-10-CM | POA: Diagnosis not present

## 2018-01-02 DIAGNOSIS — D509 Iron deficiency anemia, unspecified: Secondary | ICD-10-CM | POA: Diagnosis not present

## 2018-01-02 DIAGNOSIS — N2581 Secondary hyperparathyroidism of renal origin: Secondary | ICD-10-CM | POA: Diagnosis not present

## 2018-01-02 DIAGNOSIS — Z992 Dependence on renal dialysis: Secondary | ICD-10-CM | POA: Diagnosis not present

## 2018-01-04 DIAGNOSIS — Z992 Dependence on renal dialysis: Secondary | ICD-10-CM | POA: Diagnosis not present

## 2018-01-04 DIAGNOSIS — D509 Iron deficiency anemia, unspecified: Secondary | ICD-10-CM | POA: Diagnosis not present

## 2018-01-04 DIAGNOSIS — R110 Nausea: Secondary | ICD-10-CM | POA: Diagnosis not present

## 2018-01-04 DIAGNOSIS — N2581 Secondary hyperparathyroidism of renal origin: Secondary | ICD-10-CM | POA: Diagnosis not present

## 2018-01-04 DIAGNOSIS — D631 Anemia in chronic kidney disease: Secondary | ICD-10-CM | POA: Diagnosis not present

## 2018-01-04 DIAGNOSIS — N186 End stage renal disease: Secondary | ICD-10-CM | POA: Diagnosis not present

## 2018-01-06 DIAGNOSIS — N2581 Secondary hyperparathyroidism of renal origin: Secondary | ICD-10-CM | POA: Diagnosis not present

## 2018-01-06 DIAGNOSIS — N186 End stage renal disease: Secondary | ICD-10-CM | POA: Diagnosis not present

## 2018-01-06 DIAGNOSIS — R110 Nausea: Secondary | ICD-10-CM | POA: Diagnosis not present

## 2018-01-06 DIAGNOSIS — D631 Anemia in chronic kidney disease: Secondary | ICD-10-CM | POA: Diagnosis not present

## 2018-01-06 DIAGNOSIS — Z992 Dependence on renal dialysis: Secondary | ICD-10-CM | POA: Diagnosis not present

## 2018-01-06 DIAGNOSIS — D509 Iron deficiency anemia, unspecified: Secondary | ICD-10-CM | POA: Diagnosis not present

## 2018-01-09 DIAGNOSIS — Z992 Dependence on renal dialysis: Secondary | ICD-10-CM | POA: Diagnosis not present

## 2018-01-09 DIAGNOSIS — D509 Iron deficiency anemia, unspecified: Secondary | ICD-10-CM | POA: Diagnosis not present

## 2018-01-09 DIAGNOSIS — R110 Nausea: Secondary | ICD-10-CM | POA: Diagnosis not present

## 2018-01-09 DIAGNOSIS — D631 Anemia in chronic kidney disease: Secondary | ICD-10-CM | POA: Diagnosis not present

## 2018-01-09 DIAGNOSIS — N2581 Secondary hyperparathyroidism of renal origin: Secondary | ICD-10-CM | POA: Diagnosis not present

## 2018-01-09 DIAGNOSIS — N186 End stage renal disease: Secondary | ICD-10-CM | POA: Diagnosis not present

## 2018-01-10 DIAGNOSIS — N189 Chronic kidney disease, unspecified: Secondary | ICD-10-CM | POA: Diagnosis not present

## 2018-01-10 DIAGNOSIS — Z6841 Body Mass Index (BMI) 40.0 and over, adult: Secondary | ICD-10-CM | POA: Diagnosis not present

## 2018-01-10 DIAGNOSIS — M797 Fibromyalgia: Secondary | ICD-10-CM | POA: Diagnosis not present

## 2018-01-10 DIAGNOSIS — I890 Lymphedema, not elsewhere classified: Secondary | ICD-10-CM | POA: Diagnosis not present

## 2018-01-10 DIAGNOSIS — T85691A Other mechanical complication of intraperitoneal dialysis catheter, initial encounter: Secondary | ICD-10-CM | POA: Diagnosis not present

## 2018-01-10 DIAGNOSIS — N186 End stage renal disease: Secondary | ICD-10-CM | POA: Diagnosis not present

## 2018-01-10 DIAGNOSIS — G894 Chronic pain syndrome: Secondary | ICD-10-CM | POA: Diagnosis not present

## 2018-01-10 DIAGNOSIS — E785 Hyperlipidemia, unspecified: Secondary | ICD-10-CM | POA: Diagnosis not present

## 2018-01-10 DIAGNOSIS — Z4902 Encounter for fitting and adjustment of peritoneal dialysis catheter: Secondary | ICD-10-CM | POA: Diagnosis not present

## 2018-01-10 DIAGNOSIS — K660 Peritoneal adhesions (postprocedural) (postinfection): Secondary | ICD-10-CM | POA: Diagnosis not present

## 2018-01-10 DIAGNOSIS — Z992 Dependence on renal dialysis: Secondary | ICD-10-CM | POA: Diagnosis not present

## 2018-01-10 DIAGNOSIS — R9431 Abnormal electrocardiogram [ECG] [EKG]: Secondary | ICD-10-CM | POA: Diagnosis not present

## 2018-01-10 DIAGNOSIS — E669 Obesity, unspecified: Secondary | ICD-10-CM | POA: Diagnosis not present

## 2018-01-10 DIAGNOSIS — Z993 Dependence on wheelchair: Secondary | ICD-10-CM | POA: Diagnosis not present

## 2018-01-10 DIAGNOSIS — I120 Hypertensive chronic kidney disease with stage 5 chronic kidney disease or end stage renal disease: Secondary | ICD-10-CM | POA: Diagnosis not present

## 2018-01-10 DIAGNOSIS — Z9889 Other specified postprocedural states: Secondary | ICD-10-CM | POA: Diagnosis not present

## 2018-01-10 DIAGNOSIS — I132 Hypertensive heart and chronic kidney disease with heart failure and with stage 5 chronic kidney disease, or end stage renal disease: Secondary | ICD-10-CM | POA: Diagnosis not present

## 2018-01-10 DIAGNOSIS — E1122 Type 2 diabetes mellitus with diabetic chronic kidney disease: Secondary | ICD-10-CM | POA: Diagnosis not present

## 2018-01-10 DIAGNOSIS — I509 Heart failure, unspecified: Secondary | ICD-10-CM | POA: Diagnosis not present

## 2018-01-10 DIAGNOSIS — T85611A Breakdown (mechanical) of intraperitoneal dialysis catheter, initial encounter: Secondary | ICD-10-CM | POA: Diagnosis not present

## 2018-01-11 DIAGNOSIS — Z993 Dependence on wheelchair: Secondary | ICD-10-CM | POA: Diagnosis not present

## 2018-01-11 DIAGNOSIS — I132 Hypertensive heart and chronic kidney disease with heart failure and with stage 5 chronic kidney disease, or end stage renal disease: Secondary | ICD-10-CM | POA: Diagnosis not present

## 2018-01-11 DIAGNOSIS — E1122 Type 2 diabetes mellitus with diabetic chronic kidney disease: Secondary | ICD-10-CM | POA: Diagnosis not present

## 2018-01-11 DIAGNOSIS — K660 Peritoneal adhesions (postprocedural) (postinfection): Secondary | ICD-10-CM | POA: Diagnosis not present

## 2018-01-11 DIAGNOSIS — T85611A Breakdown (mechanical) of intraperitoneal dialysis catheter, initial encounter: Secondary | ICD-10-CM | POA: Diagnosis not present

## 2018-01-11 DIAGNOSIS — N186 End stage renal disease: Secondary | ICD-10-CM | POA: Diagnosis not present

## 2018-01-11 DIAGNOSIS — I120 Hypertensive chronic kidney disease with stage 5 chronic kidney disease or end stage renal disease: Secondary | ICD-10-CM | POA: Diagnosis not present

## 2018-01-11 DIAGNOSIS — Z992 Dependence on renal dialysis: Secondary | ICD-10-CM | POA: Diagnosis not present

## 2018-01-16 DIAGNOSIS — R110 Nausea: Secondary | ICD-10-CM | POA: Diagnosis not present

## 2018-01-16 DIAGNOSIS — D631 Anemia in chronic kidney disease: Secondary | ICD-10-CM | POA: Diagnosis not present

## 2018-01-16 DIAGNOSIS — N2581 Secondary hyperparathyroidism of renal origin: Secondary | ICD-10-CM | POA: Diagnosis not present

## 2018-01-16 DIAGNOSIS — D509 Iron deficiency anemia, unspecified: Secondary | ICD-10-CM | POA: Diagnosis not present

## 2018-01-16 DIAGNOSIS — N186 End stage renal disease: Secondary | ICD-10-CM | POA: Diagnosis not present

## 2018-01-16 DIAGNOSIS — Z992 Dependence on renal dialysis: Secondary | ICD-10-CM | POA: Diagnosis not present

## 2018-01-18 DIAGNOSIS — D509 Iron deficiency anemia, unspecified: Secondary | ICD-10-CM | POA: Diagnosis not present

## 2018-01-18 DIAGNOSIS — R110 Nausea: Secondary | ICD-10-CM | POA: Diagnosis not present

## 2018-01-18 DIAGNOSIS — D631 Anemia in chronic kidney disease: Secondary | ICD-10-CM | POA: Diagnosis not present

## 2018-01-18 DIAGNOSIS — Z992 Dependence on renal dialysis: Secondary | ICD-10-CM | POA: Diagnosis not present

## 2018-01-18 DIAGNOSIS — N186 End stage renal disease: Secondary | ICD-10-CM | POA: Diagnosis not present

## 2018-01-18 DIAGNOSIS — N2581 Secondary hyperparathyroidism of renal origin: Secondary | ICD-10-CM | POA: Diagnosis not present

## 2018-01-19 DIAGNOSIS — Z992 Dependence on renal dialysis: Secondary | ICD-10-CM | POA: Diagnosis not present

## 2018-01-19 DIAGNOSIS — N186 End stage renal disease: Secondary | ICD-10-CM | POA: Diagnosis not present

## 2018-01-19 DIAGNOSIS — E1121 Type 2 diabetes mellitus with diabetic nephropathy: Secondary | ICD-10-CM | POA: Diagnosis not present

## 2018-01-19 DIAGNOSIS — J449 Chronic obstructive pulmonary disease, unspecified: Secondary | ICD-10-CM | POA: Diagnosis not present

## 2018-01-19 DIAGNOSIS — I120 Hypertensive chronic kidney disease with stage 5 chronic kidney disease or end stage renal disease: Secondary | ICD-10-CM | POA: Diagnosis not present

## 2018-01-19 DIAGNOSIS — N189 Chronic kidney disease, unspecified: Secondary | ICD-10-CM | POA: Diagnosis not present

## 2018-01-20 DIAGNOSIS — N186 End stage renal disease: Secondary | ICD-10-CM | POA: Diagnosis not present

## 2018-01-20 DIAGNOSIS — D631 Anemia in chronic kidney disease: Secondary | ICD-10-CM | POA: Diagnosis not present

## 2018-01-20 DIAGNOSIS — Z992 Dependence on renal dialysis: Secondary | ICD-10-CM | POA: Diagnosis not present

## 2018-01-20 DIAGNOSIS — D509 Iron deficiency anemia, unspecified: Secondary | ICD-10-CM | POA: Diagnosis not present

## 2018-01-20 DIAGNOSIS — N2581 Secondary hyperparathyroidism of renal origin: Secondary | ICD-10-CM | POA: Diagnosis not present

## 2018-01-23 DIAGNOSIS — N2581 Secondary hyperparathyroidism of renal origin: Secondary | ICD-10-CM | POA: Diagnosis not present

## 2018-01-23 DIAGNOSIS — D509 Iron deficiency anemia, unspecified: Secondary | ICD-10-CM | POA: Diagnosis not present

## 2018-01-23 DIAGNOSIS — N186 End stage renal disease: Secondary | ICD-10-CM | POA: Diagnosis not present

## 2018-01-23 DIAGNOSIS — Z992 Dependence on renal dialysis: Secondary | ICD-10-CM | POA: Diagnosis not present

## 2018-01-23 DIAGNOSIS — D631 Anemia in chronic kidney disease: Secondary | ICD-10-CM | POA: Diagnosis not present

## 2018-01-25 DIAGNOSIS — N2581 Secondary hyperparathyroidism of renal origin: Secondary | ICD-10-CM | POA: Diagnosis not present

## 2018-01-25 DIAGNOSIS — N186 End stage renal disease: Secondary | ICD-10-CM | POA: Diagnosis not present

## 2018-01-25 DIAGNOSIS — D509 Iron deficiency anemia, unspecified: Secondary | ICD-10-CM | POA: Diagnosis not present

## 2018-01-25 DIAGNOSIS — D631 Anemia in chronic kidney disease: Secondary | ICD-10-CM | POA: Diagnosis not present

## 2018-01-25 DIAGNOSIS — Z992 Dependence on renal dialysis: Secondary | ICD-10-CM | POA: Diagnosis not present

## 2018-01-27 DIAGNOSIS — Z992 Dependence on renal dialysis: Secondary | ICD-10-CM | POA: Diagnosis not present

## 2018-01-27 DIAGNOSIS — D509 Iron deficiency anemia, unspecified: Secondary | ICD-10-CM | POA: Diagnosis not present

## 2018-01-27 DIAGNOSIS — D631 Anemia in chronic kidney disease: Secondary | ICD-10-CM | POA: Diagnosis not present

## 2018-01-27 DIAGNOSIS — N2581 Secondary hyperparathyroidism of renal origin: Secondary | ICD-10-CM | POA: Diagnosis not present

## 2018-01-27 DIAGNOSIS — N186 End stage renal disease: Secondary | ICD-10-CM | POA: Diagnosis not present

## 2018-01-30 DIAGNOSIS — D509 Iron deficiency anemia, unspecified: Secondary | ICD-10-CM | POA: Diagnosis not present

## 2018-01-30 DIAGNOSIS — N186 End stage renal disease: Secondary | ICD-10-CM | POA: Diagnosis not present

## 2018-01-30 DIAGNOSIS — N2581 Secondary hyperparathyroidism of renal origin: Secondary | ICD-10-CM | POA: Diagnosis not present

## 2018-01-30 DIAGNOSIS — D631 Anemia in chronic kidney disease: Secondary | ICD-10-CM | POA: Diagnosis not present

## 2018-01-30 DIAGNOSIS — Z992 Dependence on renal dialysis: Secondary | ICD-10-CM | POA: Diagnosis not present

## 2018-02-01 DIAGNOSIS — N2581 Secondary hyperparathyroidism of renal origin: Secondary | ICD-10-CM | POA: Diagnosis not present

## 2018-02-01 DIAGNOSIS — D509 Iron deficiency anemia, unspecified: Secondary | ICD-10-CM | POA: Diagnosis not present

## 2018-02-01 DIAGNOSIS — N186 End stage renal disease: Secondary | ICD-10-CM | POA: Diagnosis not present

## 2018-02-01 DIAGNOSIS — Z992 Dependence on renal dialysis: Secondary | ICD-10-CM | POA: Diagnosis not present

## 2018-02-01 DIAGNOSIS — D631 Anemia in chronic kidney disease: Secondary | ICD-10-CM | POA: Diagnosis not present

## 2018-02-03 DIAGNOSIS — N186 End stage renal disease: Secondary | ICD-10-CM | POA: Diagnosis not present

## 2018-02-03 DIAGNOSIS — D509 Iron deficiency anemia, unspecified: Secondary | ICD-10-CM | POA: Diagnosis not present

## 2018-02-03 DIAGNOSIS — Z992 Dependence on renal dialysis: Secondary | ICD-10-CM | POA: Diagnosis not present

## 2018-02-03 DIAGNOSIS — N2581 Secondary hyperparathyroidism of renal origin: Secondary | ICD-10-CM | POA: Diagnosis not present

## 2018-02-03 DIAGNOSIS — D631 Anemia in chronic kidney disease: Secondary | ICD-10-CM | POA: Diagnosis not present

## 2018-02-06 DIAGNOSIS — N186 End stage renal disease: Secondary | ICD-10-CM | POA: Diagnosis not present

## 2018-02-06 DIAGNOSIS — D509 Iron deficiency anemia, unspecified: Secondary | ICD-10-CM | POA: Diagnosis not present

## 2018-02-06 DIAGNOSIS — N2581 Secondary hyperparathyroidism of renal origin: Secondary | ICD-10-CM | POA: Diagnosis not present

## 2018-02-06 DIAGNOSIS — Z992 Dependence on renal dialysis: Secondary | ICD-10-CM | POA: Diagnosis not present

## 2018-02-06 DIAGNOSIS — D631 Anemia in chronic kidney disease: Secondary | ICD-10-CM | POA: Diagnosis not present

## 2018-02-08 DIAGNOSIS — N186 End stage renal disease: Secondary | ICD-10-CM | POA: Diagnosis not present

## 2018-02-08 DIAGNOSIS — D509 Iron deficiency anemia, unspecified: Secondary | ICD-10-CM | POA: Diagnosis not present

## 2018-02-08 DIAGNOSIS — N2581 Secondary hyperparathyroidism of renal origin: Secondary | ICD-10-CM | POA: Diagnosis not present

## 2018-02-08 DIAGNOSIS — Z992 Dependence on renal dialysis: Secondary | ICD-10-CM | POA: Diagnosis not present

## 2018-02-08 DIAGNOSIS — D631 Anemia in chronic kidney disease: Secondary | ICD-10-CM | POA: Diagnosis not present

## 2018-02-10 DIAGNOSIS — N2581 Secondary hyperparathyroidism of renal origin: Secondary | ICD-10-CM | POA: Diagnosis not present

## 2018-02-10 DIAGNOSIS — D509 Iron deficiency anemia, unspecified: Secondary | ICD-10-CM | POA: Diagnosis not present

## 2018-02-10 DIAGNOSIS — D631 Anemia in chronic kidney disease: Secondary | ICD-10-CM | POA: Diagnosis not present

## 2018-02-10 DIAGNOSIS — N186 End stage renal disease: Secondary | ICD-10-CM | POA: Diagnosis not present

## 2018-02-10 DIAGNOSIS — Z992 Dependence on renal dialysis: Secondary | ICD-10-CM | POA: Diagnosis not present

## 2018-02-13 DIAGNOSIS — R109 Unspecified abdominal pain: Secondary | ICD-10-CM | POA: Diagnosis not present

## 2018-02-13 DIAGNOSIS — R0602 Shortness of breath: Secondary | ICD-10-CM | POA: Diagnosis not present

## 2018-02-13 DIAGNOSIS — N186 End stage renal disease: Secondary | ICD-10-CM | POA: Diagnosis not present

## 2018-02-13 DIAGNOSIS — R5383 Other fatigue: Secondary | ICD-10-CM | POA: Diagnosis not present

## 2018-02-13 DIAGNOSIS — T82838A Hemorrhage of vascular prosthetic devices, implants and grafts, initial encounter: Secondary | ICD-10-CM | POA: Diagnosis not present

## 2018-02-13 DIAGNOSIS — Z4802 Encounter for removal of sutures: Secondary | ICD-10-CM | POA: Diagnosis not present

## 2018-02-13 DIAGNOSIS — R9431 Abnormal electrocardiogram [ECG] [EKG]: Secondary | ICD-10-CM | POA: Diagnosis not present

## 2018-02-13 DIAGNOSIS — R002 Palpitations: Secondary | ICD-10-CM | POA: Diagnosis not present

## 2018-02-14 DIAGNOSIS — Z79891 Long term (current) use of opiate analgesic: Secondary | ICD-10-CM | POA: Diagnosis not present

## 2018-02-15 DIAGNOSIS — Z992 Dependence on renal dialysis: Secondary | ICD-10-CM | POA: Diagnosis not present

## 2018-02-15 DIAGNOSIS — N2581 Secondary hyperparathyroidism of renal origin: Secondary | ICD-10-CM | POA: Diagnosis not present

## 2018-02-15 DIAGNOSIS — D631 Anemia in chronic kidney disease: Secondary | ICD-10-CM | POA: Diagnosis not present

## 2018-02-15 DIAGNOSIS — N186 End stage renal disease: Secondary | ICD-10-CM | POA: Diagnosis not present

## 2018-02-15 DIAGNOSIS — D509 Iron deficiency anemia, unspecified: Secondary | ICD-10-CM | POA: Diagnosis not present

## 2018-02-16 DIAGNOSIS — N186 End stage renal disease: Secondary | ICD-10-CM | POA: Diagnosis not present

## 2018-02-16 DIAGNOSIS — Z992 Dependence on renal dialysis: Secondary | ICD-10-CM | POA: Diagnosis not present

## 2018-02-17 DIAGNOSIS — D631 Anemia in chronic kidney disease: Secondary | ICD-10-CM | POA: Diagnosis not present

## 2018-02-17 DIAGNOSIS — E8779 Other fluid overload: Secondary | ICD-10-CM | POA: Diagnosis not present

## 2018-02-17 DIAGNOSIS — N2581 Secondary hyperparathyroidism of renal origin: Secondary | ICD-10-CM | POA: Diagnosis not present

## 2018-02-17 DIAGNOSIS — D509 Iron deficiency anemia, unspecified: Secondary | ICD-10-CM | POA: Diagnosis not present

## 2018-02-17 DIAGNOSIS — N186 End stage renal disease: Secondary | ICD-10-CM | POA: Diagnosis not present

## 2018-02-17 DIAGNOSIS — Z992 Dependence on renal dialysis: Secondary | ICD-10-CM | POA: Diagnosis not present

## 2018-02-20 DIAGNOSIS — N2581 Secondary hyperparathyroidism of renal origin: Secondary | ICD-10-CM | POA: Diagnosis not present

## 2018-02-20 DIAGNOSIS — N186 End stage renal disease: Secondary | ICD-10-CM | POA: Diagnosis not present

## 2018-02-20 DIAGNOSIS — E8779 Other fluid overload: Secondary | ICD-10-CM | POA: Diagnosis not present

## 2018-02-20 DIAGNOSIS — Z992 Dependence on renal dialysis: Secondary | ICD-10-CM | POA: Diagnosis not present

## 2018-02-20 DIAGNOSIS — D631 Anemia in chronic kidney disease: Secondary | ICD-10-CM | POA: Diagnosis not present

## 2018-02-20 DIAGNOSIS — D509 Iron deficiency anemia, unspecified: Secondary | ICD-10-CM | POA: Diagnosis not present

## 2018-02-21 DIAGNOSIS — E8779 Other fluid overload: Secondary | ICD-10-CM | POA: Diagnosis not present

## 2018-02-21 DIAGNOSIS — Z992 Dependence on renal dialysis: Secondary | ICD-10-CM | POA: Diagnosis not present

## 2018-02-21 DIAGNOSIS — D509 Iron deficiency anemia, unspecified: Secondary | ICD-10-CM | POA: Diagnosis not present

## 2018-02-21 DIAGNOSIS — D631 Anemia in chronic kidney disease: Secondary | ICD-10-CM | POA: Diagnosis not present

## 2018-02-21 DIAGNOSIS — N186 End stage renal disease: Secondary | ICD-10-CM | POA: Diagnosis not present

## 2018-02-21 DIAGNOSIS — N2581 Secondary hyperparathyroidism of renal origin: Secondary | ICD-10-CM | POA: Diagnosis not present

## 2018-02-22 DIAGNOSIS — N2581 Secondary hyperparathyroidism of renal origin: Secondary | ICD-10-CM | POA: Diagnosis not present

## 2018-02-22 DIAGNOSIS — E8779 Other fluid overload: Secondary | ICD-10-CM | POA: Diagnosis not present

## 2018-02-22 DIAGNOSIS — D509 Iron deficiency anemia, unspecified: Secondary | ICD-10-CM | POA: Diagnosis not present

## 2018-02-22 DIAGNOSIS — D631 Anemia in chronic kidney disease: Secondary | ICD-10-CM | POA: Diagnosis not present

## 2018-02-22 DIAGNOSIS — N186 End stage renal disease: Secondary | ICD-10-CM | POA: Diagnosis not present

## 2018-02-22 DIAGNOSIS — Z992 Dependence on renal dialysis: Secondary | ICD-10-CM | POA: Diagnosis not present

## 2018-02-24 DIAGNOSIS — N2581 Secondary hyperparathyroidism of renal origin: Secondary | ICD-10-CM | POA: Diagnosis not present

## 2018-02-24 DIAGNOSIS — D631 Anemia in chronic kidney disease: Secondary | ICD-10-CM | POA: Diagnosis not present

## 2018-02-24 DIAGNOSIS — N186 End stage renal disease: Secondary | ICD-10-CM | POA: Diagnosis not present

## 2018-02-24 DIAGNOSIS — Z992 Dependence on renal dialysis: Secondary | ICD-10-CM | POA: Diagnosis not present

## 2018-02-24 DIAGNOSIS — D509 Iron deficiency anemia, unspecified: Secondary | ICD-10-CM | POA: Diagnosis not present

## 2018-02-24 DIAGNOSIS — E8779 Other fluid overload: Secondary | ICD-10-CM | POA: Diagnosis not present

## 2018-02-27 DIAGNOSIS — N186 End stage renal disease: Secondary | ICD-10-CM | POA: Diagnosis not present

## 2018-02-27 DIAGNOSIS — Z992 Dependence on renal dialysis: Secondary | ICD-10-CM | POA: Diagnosis not present

## 2018-02-27 DIAGNOSIS — D631 Anemia in chronic kidney disease: Secondary | ICD-10-CM | POA: Diagnosis not present

## 2018-02-27 DIAGNOSIS — D509 Iron deficiency anemia, unspecified: Secondary | ICD-10-CM | POA: Diagnosis not present

## 2018-02-27 DIAGNOSIS — N2581 Secondary hyperparathyroidism of renal origin: Secondary | ICD-10-CM | POA: Diagnosis not present

## 2018-02-27 DIAGNOSIS — E8779 Other fluid overload: Secondary | ICD-10-CM | POA: Diagnosis not present

## 2018-03-01 DIAGNOSIS — E8779 Other fluid overload: Secondary | ICD-10-CM | POA: Diagnosis not present

## 2018-03-01 DIAGNOSIS — Z992 Dependence on renal dialysis: Secondary | ICD-10-CM | POA: Diagnosis not present

## 2018-03-01 DIAGNOSIS — N186 End stage renal disease: Secondary | ICD-10-CM | POA: Diagnosis not present

## 2018-03-01 DIAGNOSIS — D509 Iron deficiency anemia, unspecified: Secondary | ICD-10-CM | POA: Diagnosis not present

## 2018-03-01 DIAGNOSIS — D631 Anemia in chronic kidney disease: Secondary | ICD-10-CM | POA: Diagnosis not present

## 2018-03-01 DIAGNOSIS — N2581 Secondary hyperparathyroidism of renal origin: Secondary | ICD-10-CM | POA: Diagnosis not present

## 2018-03-03 DIAGNOSIS — D631 Anemia in chronic kidney disease: Secondary | ICD-10-CM | POA: Diagnosis not present

## 2018-03-03 DIAGNOSIS — D509 Iron deficiency anemia, unspecified: Secondary | ICD-10-CM | POA: Diagnosis not present

## 2018-03-03 DIAGNOSIS — N186 End stage renal disease: Secondary | ICD-10-CM | POA: Diagnosis not present

## 2018-03-03 DIAGNOSIS — Z992 Dependence on renal dialysis: Secondary | ICD-10-CM | POA: Diagnosis not present

## 2018-03-03 DIAGNOSIS — N2581 Secondary hyperparathyroidism of renal origin: Secondary | ICD-10-CM | POA: Diagnosis not present

## 2018-03-03 DIAGNOSIS — E8779 Other fluid overload: Secondary | ICD-10-CM | POA: Diagnosis not present

## 2018-03-08 DIAGNOSIS — Z992 Dependence on renal dialysis: Secondary | ICD-10-CM | POA: Diagnosis not present

## 2018-03-08 DIAGNOSIS — D631 Anemia in chronic kidney disease: Secondary | ICD-10-CM | POA: Diagnosis not present

## 2018-03-08 DIAGNOSIS — D509 Iron deficiency anemia, unspecified: Secondary | ICD-10-CM | POA: Diagnosis not present

## 2018-03-08 DIAGNOSIS — N186 End stage renal disease: Secondary | ICD-10-CM | POA: Diagnosis not present

## 2018-03-08 DIAGNOSIS — E8779 Other fluid overload: Secondary | ICD-10-CM | POA: Diagnosis not present

## 2018-03-08 DIAGNOSIS — N2581 Secondary hyperparathyroidism of renal origin: Secondary | ICD-10-CM | POA: Diagnosis not present

## 2018-03-10 DIAGNOSIS — N186 End stage renal disease: Secondary | ICD-10-CM | POA: Diagnosis not present

## 2018-03-10 DIAGNOSIS — N2581 Secondary hyperparathyroidism of renal origin: Secondary | ICD-10-CM | POA: Diagnosis not present

## 2018-03-10 DIAGNOSIS — D509 Iron deficiency anemia, unspecified: Secondary | ICD-10-CM | POA: Diagnosis not present

## 2018-03-10 DIAGNOSIS — D631 Anemia in chronic kidney disease: Secondary | ICD-10-CM | POA: Diagnosis not present

## 2018-03-10 DIAGNOSIS — E8779 Other fluid overload: Secondary | ICD-10-CM | POA: Diagnosis not present

## 2018-03-10 DIAGNOSIS — Z992 Dependence on renal dialysis: Secondary | ICD-10-CM | POA: Diagnosis not present

## 2018-03-13 DIAGNOSIS — D509 Iron deficiency anemia, unspecified: Secondary | ICD-10-CM | POA: Diagnosis not present

## 2018-03-13 DIAGNOSIS — N2581 Secondary hyperparathyroidism of renal origin: Secondary | ICD-10-CM | POA: Diagnosis not present

## 2018-03-13 DIAGNOSIS — E8779 Other fluid overload: Secondary | ICD-10-CM | POA: Diagnosis not present

## 2018-03-13 DIAGNOSIS — N186 End stage renal disease: Secondary | ICD-10-CM | POA: Diagnosis not present

## 2018-03-13 DIAGNOSIS — Z992 Dependence on renal dialysis: Secondary | ICD-10-CM | POA: Diagnosis not present

## 2018-03-13 DIAGNOSIS — D631 Anemia in chronic kidney disease: Secondary | ICD-10-CM | POA: Diagnosis not present

## 2018-03-15 DIAGNOSIS — N2581 Secondary hyperparathyroidism of renal origin: Secondary | ICD-10-CM | POA: Diagnosis not present

## 2018-03-15 DIAGNOSIS — Z992 Dependence on renal dialysis: Secondary | ICD-10-CM | POA: Diagnosis not present

## 2018-03-15 DIAGNOSIS — D631 Anemia in chronic kidney disease: Secondary | ICD-10-CM | POA: Diagnosis not present

## 2018-03-15 DIAGNOSIS — E8779 Other fluid overload: Secondary | ICD-10-CM | POA: Diagnosis not present

## 2018-03-15 DIAGNOSIS — N186 End stage renal disease: Secondary | ICD-10-CM | POA: Diagnosis not present

## 2018-03-15 DIAGNOSIS — D509 Iron deficiency anemia, unspecified: Secondary | ICD-10-CM | POA: Diagnosis not present

## 2018-03-17 DIAGNOSIS — D509 Iron deficiency anemia, unspecified: Secondary | ICD-10-CM | POA: Diagnosis not present

## 2018-03-17 DIAGNOSIS — D631 Anemia in chronic kidney disease: Secondary | ICD-10-CM | POA: Diagnosis not present

## 2018-03-17 DIAGNOSIS — E8779 Other fluid overload: Secondary | ICD-10-CM | POA: Diagnosis not present

## 2018-03-17 DIAGNOSIS — Z992 Dependence on renal dialysis: Secondary | ICD-10-CM | POA: Diagnosis not present

## 2018-03-17 DIAGNOSIS — N2581 Secondary hyperparathyroidism of renal origin: Secondary | ICD-10-CM | POA: Diagnosis not present

## 2018-03-17 DIAGNOSIS — N186 End stage renal disease: Secondary | ICD-10-CM | POA: Diagnosis not present

## 2018-03-19 DIAGNOSIS — N186 End stage renal disease: Secondary | ICD-10-CM | POA: Diagnosis not present

## 2018-03-19 DIAGNOSIS — Z992 Dependence on renal dialysis: Secondary | ICD-10-CM | POA: Diagnosis not present

## 2018-03-20 DIAGNOSIS — N2581 Secondary hyperparathyroidism of renal origin: Secondary | ICD-10-CM | POA: Diagnosis not present

## 2018-03-20 DIAGNOSIS — D631 Anemia in chronic kidney disease: Secondary | ICD-10-CM | POA: Diagnosis not present

## 2018-03-20 DIAGNOSIS — N186 End stage renal disease: Secondary | ICD-10-CM | POA: Diagnosis not present

## 2018-03-20 DIAGNOSIS — Z992 Dependence on renal dialysis: Secondary | ICD-10-CM | POA: Diagnosis not present

## 2018-03-20 DIAGNOSIS — D509 Iron deficiency anemia, unspecified: Secondary | ICD-10-CM | POA: Diagnosis not present

## 2018-03-22 DIAGNOSIS — N2581 Secondary hyperparathyroidism of renal origin: Secondary | ICD-10-CM | POA: Diagnosis not present

## 2018-03-22 DIAGNOSIS — D509 Iron deficiency anemia, unspecified: Secondary | ICD-10-CM | POA: Diagnosis not present

## 2018-03-22 DIAGNOSIS — Z992 Dependence on renal dialysis: Secondary | ICD-10-CM | POA: Diagnosis not present

## 2018-03-22 DIAGNOSIS — N186 End stage renal disease: Secondary | ICD-10-CM | POA: Diagnosis not present

## 2018-03-22 DIAGNOSIS — D631 Anemia in chronic kidney disease: Secondary | ICD-10-CM | POA: Diagnosis not present

## 2018-03-24 DIAGNOSIS — N2581 Secondary hyperparathyroidism of renal origin: Secondary | ICD-10-CM | POA: Diagnosis not present

## 2018-03-24 DIAGNOSIS — Z992 Dependence on renal dialysis: Secondary | ICD-10-CM | POA: Diagnosis not present

## 2018-03-24 DIAGNOSIS — N186 End stage renal disease: Secondary | ICD-10-CM | POA: Diagnosis not present

## 2018-03-24 DIAGNOSIS — D509 Iron deficiency anemia, unspecified: Secondary | ICD-10-CM | POA: Diagnosis not present

## 2018-03-24 DIAGNOSIS — D631 Anemia in chronic kidney disease: Secondary | ICD-10-CM | POA: Diagnosis not present

## 2018-03-29 DIAGNOSIS — I5032 Chronic diastolic (congestive) heart failure: Secondary | ICD-10-CM | POA: Diagnosis not present

## 2018-03-29 DIAGNOSIS — N2581 Secondary hyperparathyroidism of renal origin: Secondary | ICD-10-CM | POA: Diagnosis not present

## 2018-03-29 DIAGNOSIS — N189 Chronic kidney disease, unspecified: Secondary | ICD-10-CM | POA: Diagnosis not present

## 2018-03-29 DIAGNOSIS — Z992 Dependence on renal dialysis: Secondary | ICD-10-CM | POA: Diagnosis not present

## 2018-03-29 DIAGNOSIS — T85611D Breakdown (mechanical) of intraperitoneal dialysis catheter, subsequent encounter: Secondary | ICD-10-CM | POA: Diagnosis not present

## 2018-03-29 DIAGNOSIS — N186 End stage renal disease: Secondary | ICD-10-CM | POA: Diagnosis not present

## 2018-03-29 DIAGNOSIS — D509 Iron deficiency anemia, unspecified: Secondary | ICD-10-CM | POA: Diagnosis not present

## 2018-03-29 DIAGNOSIS — D631 Anemia in chronic kidney disease: Secondary | ICD-10-CM | POA: Diagnosis not present

## 2018-03-31 DIAGNOSIS — D631 Anemia in chronic kidney disease: Secondary | ICD-10-CM | POA: Diagnosis not present

## 2018-03-31 DIAGNOSIS — N186 End stage renal disease: Secondary | ICD-10-CM | POA: Diagnosis not present

## 2018-03-31 DIAGNOSIS — N2581 Secondary hyperparathyroidism of renal origin: Secondary | ICD-10-CM | POA: Diagnosis not present

## 2018-03-31 DIAGNOSIS — Z992 Dependence on renal dialysis: Secondary | ICD-10-CM | POA: Diagnosis not present

## 2018-03-31 DIAGNOSIS — D509 Iron deficiency anemia, unspecified: Secondary | ICD-10-CM | POA: Diagnosis not present

## 2018-04-03 DIAGNOSIS — D509 Iron deficiency anemia, unspecified: Secondary | ICD-10-CM | POA: Diagnosis not present

## 2018-04-03 DIAGNOSIS — N186 End stage renal disease: Secondary | ICD-10-CM | POA: Diagnosis not present

## 2018-04-03 DIAGNOSIS — N2581 Secondary hyperparathyroidism of renal origin: Secondary | ICD-10-CM | POA: Diagnosis not present

## 2018-04-03 DIAGNOSIS — Z992 Dependence on renal dialysis: Secondary | ICD-10-CM | POA: Diagnosis not present

## 2018-04-03 DIAGNOSIS — D631 Anemia in chronic kidney disease: Secondary | ICD-10-CM | POA: Diagnosis not present

## 2018-04-05 DIAGNOSIS — N2581 Secondary hyperparathyroidism of renal origin: Secondary | ICD-10-CM | POA: Diagnosis not present

## 2018-04-05 DIAGNOSIS — N186 End stage renal disease: Secondary | ICD-10-CM | POA: Diagnosis not present

## 2018-04-05 DIAGNOSIS — Z992 Dependence on renal dialysis: Secondary | ICD-10-CM | POA: Diagnosis not present

## 2018-04-05 DIAGNOSIS — D509 Iron deficiency anemia, unspecified: Secondary | ICD-10-CM | POA: Diagnosis not present

## 2018-04-05 DIAGNOSIS — D631 Anemia in chronic kidney disease: Secondary | ICD-10-CM | POA: Diagnosis not present

## 2018-04-07 DIAGNOSIS — Z992 Dependence on renal dialysis: Secondary | ICD-10-CM | POA: Diagnosis not present

## 2018-04-07 DIAGNOSIS — N2581 Secondary hyperparathyroidism of renal origin: Secondary | ICD-10-CM | POA: Diagnosis not present

## 2018-04-07 DIAGNOSIS — D509 Iron deficiency anemia, unspecified: Secondary | ICD-10-CM | POA: Diagnosis not present

## 2018-04-07 DIAGNOSIS — N186 End stage renal disease: Secondary | ICD-10-CM | POA: Diagnosis not present

## 2018-04-07 DIAGNOSIS — D631 Anemia in chronic kidney disease: Secondary | ICD-10-CM | POA: Diagnosis not present

## 2018-04-10 DIAGNOSIS — Z992 Dependence on renal dialysis: Secondary | ICD-10-CM | POA: Diagnosis not present

## 2018-04-10 DIAGNOSIS — D631 Anemia in chronic kidney disease: Secondary | ICD-10-CM | POA: Diagnosis not present

## 2018-04-10 DIAGNOSIS — D509 Iron deficiency anemia, unspecified: Secondary | ICD-10-CM | POA: Diagnosis not present

## 2018-04-10 DIAGNOSIS — N186 End stage renal disease: Secondary | ICD-10-CM | POA: Diagnosis not present

## 2018-04-10 DIAGNOSIS — N2581 Secondary hyperparathyroidism of renal origin: Secondary | ICD-10-CM | POA: Diagnosis not present

## 2018-04-12 DIAGNOSIS — N186 End stage renal disease: Secondary | ICD-10-CM | POA: Diagnosis not present

## 2018-04-12 DIAGNOSIS — Z992 Dependence on renal dialysis: Secondary | ICD-10-CM | POA: Diagnosis not present

## 2018-04-12 DIAGNOSIS — N2581 Secondary hyperparathyroidism of renal origin: Secondary | ICD-10-CM | POA: Diagnosis not present

## 2018-04-12 DIAGNOSIS — D509 Iron deficiency anemia, unspecified: Secondary | ICD-10-CM | POA: Diagnosis not present

## 2018-04-12 DIAGNOSIS — D631 Anemia in chronic kidney disease: Secondary | ICD-10-CM | POA: Diagnosis not present

## 2018-04-14 DIAGNOSIS — N2581 Secondary hyperparathyroidism of renal origin: Secondary | ICD-10-CM | POA: Diagnosis not present

## 2018-04-14 DIAGNOSIS — D631 Anemia in chronic kidney disease: Secondary | ICD-10-CM | POA: Diagnosis not present

## 2018-04-14 DIAGNOSIS — Z992 Dependence on renal dialysis: Secondary | ICD-10-CM | POA: Diagnosis not present

## 2018-04-14 DIAGNOSIS — N186 End stage renal disease: Secondary | ICD-10-CM | POA: Diagnosis not present

## 2018-04-14 DIAGNOSIS — D509 Iron deficiency anemia, unspecified: Secondary | ICD-10-CM | POA: Diagnosis not present

## 2018-04-17 DIAGNOSIS — D631 Anemia in chronic kidney disease: Secondary | ICD-10-CM | POA: Diagnosis not present

## 2018-04-17 DIAGNOSIS — N2581 Secondary hyperparathyroidism of renal origin: Secondary | ICD-10-CM | POA: Diagnosis not present

## 2018-04-17 DIAGNOSIS — D509 Iron deficiency anemia, unspecified: Secondary | ICD-10-CM | POA: Diagnosis not present

## 2018-04-17 DIAGNOSIS — N186 End stage renal disease: Secondary | ICD-10-CM | POA: Diagnosis not present

## 2018-04-17 DIAGNOSIS — Z992 Dependence on renal dialysis: Secondary | ICD-10-CM | POA: Diagnosis not present

## 2018-04-18 DIAGNOSIS — Z992 Dependence on renal dialysis: Secondary | ICD-10-CM | POA: Diagnosis not present

## 2018-04-18 DIAGNOSIS — N186 End stage renal disease: Secondary | ICD-10-CM | POA: Diagnosis not present

## 2018-04-19 DIAGNOSIS — N186 End stage renal disease: Secondary | ICD-10-CM | POA: Diagnosis not present

## 2018-04-19 DIAGNOSIS — Z992 Dependence on renal dialysis: Secondary | ICD-10-CM | POA: Diagnosis not present

## 2018-04-19 DIAGNOSIS — N2581 Secondary hyperparathyroidism of renal origin: Secondary | ICD-10-CM | POA: Diagnosis not present

## 2018-04-19 DIAGNOSIS — D631 Anemia in chronic kidney disease: Secondary | ICD-10-CM | POA: Diagnosis not present

## 2018-04-19 DIAGNOSIS — D509 Iron deficiency anemia, unspecified: Secondary | ICD-10-CM | POA: Diagnosis not present

## 2018-04-20 DIAGNOSIS — I120 Hypertensive chronic kidney disease with stage 5 chronic kidney disease or end stage renal disease: Secondary | ICD-10-CM | POA: Diagnosis not present

## 2018-04-20 DIAGNOSIS — N185 Chronic kidney disease, stage 5: Secondary | ICD-10-CM | POA: Diagnosis not present

## 2018-04-20 DIAGNOSIS — E1121 Type 2 diabetes mellitus with diabetic nephropathy: Secondary | ICD-10-CM | POA: Diagnosis not present

## 2018-04-20 DIAGNOSIS — I10 Essential (primary) hypertension: Secondary | ICD-10-CM | POA: Diagnosis not present

## 2018-04-21 DIAGNOSIS — D631 Anemia in chronic kidney disease: Secondary | ICD-10-CM | POA: Diagnosis not present

## 2018-04-21 DIAGNOSIS — N186 End stage renal disease: Secondary | ICD-10-CM | POA: Diagnosis not present

## 2018-04-21 DIAGNOSIS — D509 Iron deficiency anemia, unspecified: Secondary | ICD-10-CM | POA: Diagnosis not present

## 2018-04-21 DIAGNOSIS — N2581 Secondary hyperparathyroidism of renal origin: Secondary | ICD-10-CM | POA: Diagnosis not present

## 2018-04-21 DIAGNOSIS — Z992 Dependence on renal dialysis: Secondary | ICD-10-CM | POA: Diagnosis not present

## 2018-04-24 DIAGNOSIS — Z992 Dependence on renal dialysis: Secondary | ICD-10-CM | POA: Diagnosis not present

## 2018-04-24 DIAGNOSIS — N186 End stage renal disease: Secondary | ICD-10-CM | POA: Diagnosis not present

## 2018-04-24 DIAGNOSIS — D631 Anemia in chronic kidney disease: Secondary | ICD-10-CM | POA: Diagnosis not present

## 2018-04-24 DIAGNOSIS — D509 Iron deficiency anemia, unspecified: Secondary | ICD-10-CM | POA: Diagnosis not present

## 2018-04-24 DIAGNOSIS — N2581 Secondary hyperparathyroidism of renal origin: Secondary | ICD-10-CM | POA: Diagnosis not present

## 2018-04-26 DIAGNOSIS — N2581 Secondary hyperparathyroidism of renal origin: Secondary | ICD-10-CM | POA: Diagnosis not present

## 2018-04-26 DIAGNOSIS — D509 Iron deficiency anemia, unspecified: Secondary | ICD-10-CM | POA: Diagnosis not present

## 2018-04-26 DIAGNOSIS — N186 End stage renal disease: Secondary | ICD-10-CM | POA: Diagnosis not present

## 2018-04-26 DIAGNOSIS — D631 Anemia in chronic kidney disease: Secondary | ICD-10-CM | POA: Diagnosis not present

## 2018-04-26 DIAGNOSIS — Z992 Dependence on renal dialysis: Secondary | ICD-10-CM | POA: Diagnosis not present

## 2018-04-28 DIAGNOSIS — N2581 Secondary hyperparathyroidism of renal origin: Secondary | ICD-10-CM | POA: Diagnosis not present

## 2018-04-28 DIAGNOSIS — Z992 Dependence on renal dialysis: Secondary | ICD-10-CM | POA: Diagnosis not present

## 2018-04-28 DIAGNOSIS — N186 End stage renal disease: Secondary | ICD-10-CM | POA: Diagnosis not present

## 2018-04-28 DIAGNOSIS — D509 Iron deficiency anemia, unspecified: Secondary | ICD-10-CM | POA: Diagnosis not present

## 2018-04-28 DIAGNOSIS — D631 Anemia in chronic kidney disease: Secondary | ICD-10-CM | POA: Diagnosis not present

## 2018-05-01 DIAGNOSIS — N186 End stage renal disease: Secondary | ICD-10-CM | POA: Diagnosis not present

## 2018-05-01 DIAGNOSIS — Z992 Dependence on renal dialysis: Secondary | ICD-10-CM | POA: Diagnosis not present

## 2018-05-01 DIAGNOSIS — N2581 Secondary hyperparathyroidism of renal origin: Secondary | ICD-10-CM | POA: Diagnosis not present

## 2018-05-01 DIAGNOSIS — D509 Iron deficiency anemia, unspecified: Secondary | ICD-10-CM | POA: Diagnosis not present

## 2018-05-01 DIAGNOSIS — D631 Anemia in chronic kidney disease: Secondary | ICD-10-CM | POA: Diagnosis not present

## 2018-05-03 DIAGNOSIS — D631 Anemia in chronic kidney disease: Secondary | ICD-10-CM | POA: Diagnosis not present

## 2018-05-03 DIAGNOSIS — Z992 Dependence on renal dialysis: Secondary | ICD-10-CM | POA: Diagnosis not present

## 2018-05-03 DIAGNOSIS — D509 Iron deficiency anemia, unspecified: Secondary | ICD-10-CM | POA: Diagnosis not present

## 2018-05-03 DIAGNOSIS — N2581 Secondary hyperparathyroidism of renal origin: Secondary | ICD-10-CM | POA: Diagnosis not present

## 2018-05-03 DIAGNOSIS — N186 End stage renal disease: Secondary | ICD-10-CM | POA: Diagnosis not present

## 2018-05-05 DIAGNOSIS — N186 End stage renal disease: Secondary | ICD-10-CM | POA: Diagnosis not present

## 2018-05-05 DIAGNOSIS — T85611A Breakdown (mechanical) of intraperitoneal dialysis catheter, initial encounter: Secondary | ICD-10-CM | POA: Diagnosis not present

## 2018-05-05 DIAGNOSIS — Z6841 Body Mass Index (BMI) 40.0 and over, adult: Secondary | ICD-10-CM | POA: Diagnosis not present

## 2018-05-05 DIAGNOSIS — Z992 Dependence on renal dialysis: Secondary | ICD-10-CM | POA: Diagnosis not present

## 2018-05-05 DIAGNOSIS — Z4902 Encounter for fitting and adjustment of peritoneal dialysis catheter: Secondary | ICD-10-CM | POA: Diagnosis not present

## 2018-05-05 DIAGNOSIS — J449 Chronic obstructive pulmonary disease, unspecified: Secondary | ICD-10-CM | POA: Diagnosis not present

## 2018-05-05 DIAGNOSIS — I2510 Atherosclerotic heart disease of native coronary artery without angina pectoris: Secondary | ICD-10-CM | POA: Diagnosis not present

## 2018-05-05 DIAGNOSIS — K660 Peritoneal adhesions (postprocedural) (postinfection): Secondary | ICD-10-CM | POA: Diagnosis not present

## 2018-05-05 DIAGNOSIS — I132 Hypertensive heart and chronic kidney disease with heart failure and with stage 5 chronic kidney disease, or end stage renal disease: Secondary | ICD-10-CM | POA: Diagnosis not present

## 2018-05-05 DIAGNOSIS — T85691A Other mechanical complication of intraperitoneal dialysis catheter, initial encounter: Secondary | ICD-10-CM | POA: Diagnosis not present

## 2018-05-05 DIAGNOSIS — I509 Heart failure, unspecified: Secondary | ICD-10-CM | POA: Diagnosis not present

## 2018-05-05 DIAGNOSIS — K439 Ventral hernia without obstruction or gangrene: Secondary | ICD-10-CM | POA: Diagnosis not present

## 2018-05-05 DIAGNOSIS — Z993 Dependence on wheelchair: Secondary | ICD-10-CM | POA: Diagnosis not present

## 2018-05-05 DIAGNOSIS — Z9889 Other specified postprocedural states: Secondary | ICD-10-CM | POA: Diagnosis not present

## 2018-05-06 DIAGNOSIS — D509 Iron deficiency anemia, unspecified: Secondary | ICD-10-CM | POA: Diagnosis not present

## 2018-05-06 DIAGNOSIS — N2581 Secondary hyperparathyroidism of renal origin: Secondary | ICD-10-CM | POA: Diagnosis not present

## 2018-05-06 DIAGNOSIS — N186 End stage renal disease: Secondary | ICD-10-CM | POA: Diagnosis not present

## 2018-05-06 DIAGNOSIS — Z992 Dependence on renal dialysis: Secondary | ICD-10-CM | POA: Diagnosis not present

## 2018-05-06 DIAGNOSIS — D631 Anemia in chronic kidney disease: Secondary | ICD-10-CM | POA: Diagnosis not present

## 2018-05-08 DIAGNOSIS — N186 End stage renal disease: Secondary | ICD-10-CM | POA: Diagnosis not present

## 2018-05-08 DIAGNOSIS — Z992 Dependence on renal dialysis: Secondary | ICD-10-CM | POA: Diagnosis not present

## 2018-05-08 DIAGNOSIS — D631 Anemia in chronic kidney disease: Secondary | ICD-10-CM | POA: Diagnosis not present

## 2018-05-08 DIAGNOSIS — D509 Iron deficiency anemia, unspecified: Secondary | ICD-10-CM | POA: Diagnosis not present

## 2018-05-08 DIAGNOSIS — N2581 Secondary hyperparathyroidism of renal origin: Secondary | ICD-10-CM | POA: Diagnosis not present

## 2018-05-10 DIAGNOSIS — D509 Iron deficiency anemia, unspecified: Secondary | ICD-10-CM | POA: Diagnosis not present

## 2018-05-10 DIAGNOSIS — D631 Anemia in chronic kidney disease: Secondary | ICD-10-CM | POA: Diagnosis not present

## 2018-05-10 DIAGNOSIS — Z992 Dependence on renal dialysis: Secondary | ICD-10-CM | POA: Diagnosis not present

## 2018-05-10 DIAGNOSIS — N2581 Secondary hyperparathyroidism of renal origin: Secondary | ICD-10-CM | POA: Diagnosis not present

## 2018-05-10 DIAGNOSIS — N186 End stage renal disease: Secondary | ICD-10-CM | POA: Diagnosis not present

## 2018-05-12 DIAGNOSIS — D631 Anemia in chronic kidney disease: Secondary | ICD-10-CM | POA: Diagnosis not present

## 2018-05-12 DIAGNOSIS — D509 Iron deficiency anemia, unspecified: Secondary | ICD-10-CM | POA: Diagnosis not present

## 2018-05-12 DIAGNOSIS — N2581 Secondary hyperparathyroidism of renal origin: Secondary | ICD-10-CM | POA: Diagnosis not present

## 2018-05-12 DIAGNOSIS — N186 End stage renal disease: Secondary | ICD-10-CM | POA: Diagnosis not present

## 2018-05-12 DIAGNOSIS — Z992 Dependence on renal dialysis: Secondary | ICD-10-CM | POA: Diagnosis not present

## 2018-05-15 DIAGNOSIS — D509 Iron deficiency anemia, unspecified: Secondary | ICD-10-CM | POA: Diagnosis not present

## 2018-05-15 DIAGNOSIS — D631 Anemia in chronic kidney disease: Secondary | ICD-10-CM | POA: Diagnosis not present

## 2018-05-15 DIAGNOSIS — N2581 Secondary hyperparathyroidism of renal origin: Secondary | ICD-10-CM | POA: Diagnosis not present

## 2018-05-15 DIAGNOSIS — Z992 Dependence on renal dialysis: Secondary | ICD-10-CM | POA: Diagnosis not present

## 2018-05-15 DIAGNOSIS — N186 End stage renal disease: Secondary | ICD-10-CM | POA: Diagnosis not present

## 2018-05-17 DIAGNOSIS — D509 Iron deficiency anemia, unspecified: Secondary | ICD-10-CM | POA: Diagnosis not present

## 2018-05-17 DIAGNOSIS — N186 End stage renal disease: Secondary | ICD-10-CM | POA: Diagnosis not present

## 2018-05-17 DIAGNOSIS — N2581 Secondary hyperparathyroidism of renal origin: Secondary | ICD-10-CM | POA: Diagnosis not present

## 2018-05-17 DIAGNOSIS — Z992 Dependence on renal dialysis: Secondary | ICD-10-CM | POA: Diagnosis not present

## 2018-05-17 DIAGNOSIS — D631 Anemia in chronic kidney disease: Secondary | ICD-10-CM | POA: Diagnosis not present

## 2018-05-19 DIAGNOSIS — N2581 Secondary hyperparathyroidism of renal origin: Secondary | ICD-10-CM | POA: Diagnosis not present

## 2018-05-19 DIAGNOSIS — D631 Anemia in chronic kidney disease: Secondary | ICD-10-CM | POA: Diagnosis not present

## 2018-05-19 DIAGNOSIS — Z992 Dependence on renal dialysis: Secondary | ICD-10-CM | POA: Diagnosis not present

## 2018-05-19 DIAGNOSIS — N186 End stage renal disease: Secondary | ICD-10-CM | POA: Diagnosis not present

## 2018-05-19 DIAGNOSIS — D509 Iron deficiency anemia, unspecified: Secondary | ICD-10-CM | POA: Diagnosis not present

## 2018-05-22 DIAGNOSIS — N186 End stage renal disease: Secondary | ICD-10-CM | POA: Diagnosis not present

## 2018-05-22 DIAGNOSIS — Z992 Dependence on renal dialysis: Secondary | ICD-10-CM | POA: Diagnosis not present

## 2018-05-22 DIAGNOSIS — D631 Anemia in chronic kidney disease: Secondary | ICD-10-CM | POA: Diagnosis not present

## 2018-05-22 DIAGNOSIS — N2581 Secondary hyperparathyroidism of renal origin: Secondary | ICD-10-CM | POA: Diagnosis not present

## 2018-05-22 DIAGNOSIS — D509 Iron deficiency anemia, unspecified: Secondary | ICD-10-CM | POA: Diagnosis not present

## 2018-05-23 DIAGNOSIS — N186 End stage renal disease: Secondary | ICD-10-CM | POA: Diagnosis not present

## 2018-05-23 DIAGNOSIS — N2581 Secondary hyperparathyroidism of renal origin: Secondary | ICD-10-CM | POA: Diagnosis not present

## 2018-05-23 DIAGNOSIS — D509 Iron deficiency anemia, unspecified: Secondary | ICD-10-CM | POA: Diagnosis not present

## 2018-05-23 DIAGNOSIS — D631 Anemia in chronic kidney disease: Secondary | ICD-10-CM | POA: Diagnosis not present

## 2018-05-23 DIAGNOSIS — Z992 Dependence on renal dialysis: Secondary | ICD-10-CM | POA: Diagnosis not present

## 2018-05-24 DIAGNOSIS — Z992 Dependence on renal dialysis: Secondary | ICD-10-CM | POA: Diagnosis not present

## 2018-05-24 DIAGNOSIS — N186 End stage renal disease: Secondary | ICD-10-CM | POA: Diagnosis not present

## 2018-05-25 DIAGNOSIS — Z992 Dependence on renal dialysis: Secondary | ICD-10-CM | POA: Diagnosis not present

## 2018-05-25 DIAGNOSIS — N186 End stage renal disease: Secondary | ICD-10-CM | POA: Diagnosis not present

## 2018-05-26 DIAGNOSIS — N186 End stage renal disease: Secondary | ICD-10-CM | POA: Diagnosis not present

## 2018-05-26 DIAGNOSIS — Z992 Dependence on renal dialysis: Secondary | ICD-10-CM | POA: Diagnosis not present

## 2018-05-27 DIAGNOSIS — N186 End stage renal disease: Secondary | ICD-10-CM | POA: Diagnosis not present

## 2018-05-27 DIAGNOSIS — Z992 Dependence on renal dialysis: Secondary | ICD-10-CM | POA: Diagnosis not present

## 2018-05-29 DIAGNOSIS — D509 Iron deficiency anemia, unspecified: Secondary | ICD-10-CM | POA: Diagnosis not present

## 2018-05-29 DIAGNOSIS — Z1159 Encounter for screening for other viral diseases: Secondary | ICD-10-CM | POA: Diagnosis not present

## 2018-05-29 DIAGNOSIS — N2581 Secondary hyperparathyroidism of renal origin: Secondary | ICD-10-CM | POA: Diagnosis not present

## 2018-05-29 DIAGNOSIS — Z992 Dependence on renal dialysis: Secondary | ICD-10-CM | POA: Diagnosis not present

## 2018-05-29 DIAGNOSIS — N186 End stage renal disease: Secondary | ICD-10-CM | POA: Diagnosis not present

## 2018-05-30 DIAGNOSIS — Z992 Dependence on renal dialysis: Secondary | ICD-10-CM | POA: Diagnosis not present

## 2018-05-30 DIAGNOSIS — N186 End stage renal disease: Secondary | ICD-10-CM | POA: Diagnosis not present

## 2018-05-30 DIAGNOSIS — D509 Iron deficiency anemia, unspecified: Secondary | ICD-10-CM | POA: Diagnosis not present

## 2018-05-30 DIAGNOSIS — N2581 Secondary hyperparathyroidism of renal origin: Secondary | ICD-10-CM | POA: Diagnosis not present

## 2018-06-01 DIAGNOSIS — N2581 Secondary hyperparathyroidism of renal origin: Secondary | ICD-10-CM | POA: Diagnosis not present

## 2018-06-01 DIAGNOSIS — N186 End stage renal disease: Secondary | ICD-10-CM | POA: Diagnosis not present

## 2018-06-01 DIAGNOSIS — D509 Iron deficiency anemia, unspecified: Secondary | ICD-10-CM | POA: Diagnosis not present

## 2018-06-01 DIAGNOSIS — Z992 Dependence on renal dialysis: Secondary | ICD-10-CM | POA: Diagnosis not present

## 2018-06-02 DIAGNOSIS — N186 End stage renal disease: Secondary | ICD-10-CM | POA: Diagnosis not present

## 2018-06-02 DIAGNOSIS — Z992 Dependence on renal dialysis: Secondary | ICD-10-CM | POA: Diagnosis not present

## 2018-06-03 DIAGNOSIS — Z992 Dependence on renal dialysis: Secondary | ICD-10-CM | POA: Diagnosis not present

## 2018-06-03 DIAGNOSIS — N186 End stage renal disease: Secondary | ICD-10-CM | POA: Diagnosis not present

## 2018-06-05 DIAGNOSIS — Z992 Dependence on renal dialysis: Secondary | ICD-10-CM | POA: Diagnosis not present

## 2018-06-05 DIAGNOSIS — Z7189 Other specified counseling: Secondary | ICD-10-CM | POA: Diagnosis not present

## 2018-06-05 DIAGNOSIS — N186 End stage renal disease: Secondary | ICD-10-CM | POA: Diagnosis not present

## 2018-06-06 DIAGNOSIS — Z992 Dependence on renal dialysis: Secondary | ICD-10-CM | POA: Diagnosis not present

## 2018-06-06 DIAGNOSIS — N186 End stage renal disease: Secondary | ICD-10-CM | POA: Diagnosis not present

## 2018-06-07 DIAGNOSIS — N186 End stage renal disease: Secondary | ICD-10-CM | POA: Diagnosis not present

## 2018-06-07 DIAGNOSIS — Z992 Dependence on renal dialysis: Secondary | ICD-10-CM | POA: Diagnosis not present

## 2018-06-08 DIAGNOSIS — Z992 Dependence on renal dialysis: Secondary | ICD-10-CM | POA: Diagnosis not present

## 2018-06-08 DIAGNOSIS — N186 End stage renal disease: Secondary | ICD-10-CM | POA: Diagnosis not present

## 2018-06-09 DIAGNOSIS — Z992 Dependence on renal dialysis: Secondary | ICD-10-CM | POA: Diagnosis not present

## 2018-06-09 DIAGNOSIS — N186 End stage renal disease: Secondary | ICD-10-CM | POA: Diagnosis not present

## 2018-06-10 DIAGNOSIS — Z992 Dependence on renal dialysis: Secondary | ICD-10-CM | POA: Diagnosis not present

## 2018-06-10 DIAGNOSIS — N186 End stage renal disease: Secondary | ICD-10-CM | POA: Diagnosis not present

## 2018-06-11 DIAGNOSIS — Z992 Dependence on renal dialysis: Secondary | ICD-10-CM | POA: Diagnosis not present

## 2018-06-11 DIAGNOSIS — N186 End stage renal disease: Secondary | ICD-10-CM | POA: Diagnosis not present

## 2018-06-12 DIAGNOSIS — N186 End stage renal disease: Secondary | ICD-10-CM | POA: Diagnosis not present

## 2018-06-12 DIAGNOSIS — Z992 Dependence on renal dialysis: Secondary | ICD-10-CM | POA: Diagnosis not present

## 2018-06-13 DIAGNOSIS — N186 End stage renal disease: Secondary | ICD-10-CM | POA: Diagnosis not present

## 2018-06-13 DIAGNOSIS — Z992 Dependence on renal dialysis: Secondary | ICD-10-CM | POA: Diagnosis not present

## 2018-06-14 DIAGNOSIS — Z992 Dependence on renal dialysis: Secondary | ICD-10-CM | POA: Diagnosis not present

## 2018-06-14 DIAGNOSIS — N186 End stage renal disease: Secondary | ICD-10-CM | POA: Diagnosis not present

## 2018-06-15 DIAGNOSIS — Z992 Dependence on renal dialysis: Secondary | ICD-10-CM | POA: Diagnosis not present

## 2018-06-15 DIAGNOSIS — N186 End stage renal disease: Secondary | ICD-10-CM | POA: Diagnosis not present

## 2018-06-16 DIAGNOSIS — N186 End stage renal disease: Secondary | ICD-10-CM | POA: Diagnosis not present

## 2018-06-16 DIAGNOSIS — Z992 Dependence on renal dialysis: Secondary | ICD-10-CM | POA: Diagnosis not present

## 2018-06-17 DIAGNOSIS — Z992 Dependence on renal dialysis: Secondary | ICD-10-CM | POA: Diagnosis not present

## 2018-06-17 DIAGNOSIS — N186 End stage renal disease: Secondary | ICD-10-CM | POA: Diagnosis not present

## 2018-06-18 DIAGNOSIS — Z992 Dependence on renal dialysis: Secondary | ICD-10-CM | POA: Diagnosis not present

## 2018-06-18 DIAGNOSIS — N186 End stage renal disease: Secondary | ICD-10-CM | POA: Diagnosis not present

## 2018-06-19 DIAGNOSIS — Z992 Dependence on renal dialysis: Secondary | ICD-10-CM | POA: Diagnosis not present

## 2018-06-19 DIAGNOSIS — N186 End stage renal disease: Secondary | ICD-10-CM | POA: Diagnosis not present

## 2018-06-20 DIAGNOSIS — Z992 Dependence on renal dialysis: Secondary | ICD-10-CM | POA: Diagnosis not present

## 2018-06-20 DIAGNOSIS — N186 End stage renal disease: Secondary | ICD-10-CM | POA: Diagnosis not present

## 2018-06-21 DIAGNOSIS — N186 End stage renal disease: Secondary | ICD-10-CM | POA: Diagnosis not present

## 2018-06-21 DIAGNOSIS — Z992 Dependence on renal dialysis: Secondary | ICD-10-CM | POA: Diagnosis not present

## 2018-06-22 DIAGNOSIS — Z992 Dependence on renal dialysis: Secondary | ICD-10-CM | POA: Diagnosis not present

## 2018-06-22 DIAGNOSIS — N186 End stage renal disease: Secondary | ICD-10-CM | POA: Diagnosis not present

## 2018-06-23 DIAGNOSIS — Z992 Dependence on renal dialysis: Secondary | ICD-10-CM | POA: Diagnosis not present

## 2018-06-23 DIAGNOSIS — N186 End stage renal disease: Secondary | ICD-10-CM | POA: Diagnosis not present

## 2018-06-24 DIAGNOSIS — Z992 Dependence on renal dialysis: Secondary | ICD-10-CM | POA: Diagnosis not present

## 2018-06-24 DIAGNOSIS — N186 End stage renal disease: Secondary | ICD-10-CM | POA: Diagnosis not present

## 2018-06-25 DIAGNOSIS — Z992 Dependence on renal dialysis: Secondary | ICD-10-CM | POA: Diagnosis not present

## 2018-06-25 DIAGNOSIS — N186 End stage renal disease: Secondary | ICD-10-CM | POA: Diagnosis not present

## 2018-06-26 DIAGNOSIS — Z992 Dependence on renal dialysis: Secondary | ICD-10-CM | POA: Diagnosis not present

## 2018-06-26 DIAGNOSIS — N186 End stage renal disease: Secondary | ICD-10-CM | POA: Diagnosis not present

## 2018-06-27 ENCOUNTER — Emergency Department (HOSPITAL_COMMUNITY): Payer: Medicare Other

## 2018-06-27 ENCOUNTER — Other Ambulatory Visit: Payer: Self-pay

## 2018-06-27 ENCOUNTER — Inpatient Hospital Stay (HOSPITAL_COMMUNITY)
Admission: EM | Admit: 2018-06-27 | Discharge: 2018-06-30 | DRG: 640 | Disposition: A | Discharge status: Home Health | Payer: Medicare Other | Attending: Family Medicine | Admitting: Family Medicine

## 2018-06-27 ENCOUNTER — Encounter (HOSPITAL_COMMUNITY): Payer: Self-pay

## 2018-06-27 DIAGNOSIS — E876 Hypokalemia: Secondary | ICD-10-CM | POA: Diagnosis present

## 2018-06-27 DIAGNOSIS — Z800 Family history of malignant neoplasm of digestive organs: Secondary | ICD-10-CM

## 2018-06-27 DIAGNOSIS — I132 Hypertensive heart and chronic kidney disease with heart failure and with stage 5 chronic kidney disease, or end stage renal disease: Secondary | ICD-10-CM | POA: Diagnosis present

## 2018-06-27 DIAGNOSIS — Z992 Dependence on renal dialysis: Secondary | ICD-10-CM | POA: Diagnosis not present

## 2018-06-27 DIAGNOSIS — Z887 Allergy status to serum and vaccine status: Secondary | ICD-10-CM | POA: Diagnosis not present

## 2018-06-27 DIAGNOSIS — I890 Lymphedema, not elsewhere classified: Secondary | ICD-10-CM | POA: Diagnosis present

## 2018-06-27 DIAGNOSIS — Z6841 Body Mass Index (BMI) 40.0 and over, adult: Secondary | ICD-10-CM | POA: Diagnosis not present

## 2018-06-27 DIAGNOSIS — M797 Fibromyalgia: Secondary | ICD-10-CM | POA: Diagnosis present

## 2018-06-27 DIAGNOSIS — E1122 Type 2 diabetes mellitus with diabetic chronic kidney disease: Secondary | ICD-10-CM | POA: Diagnosis not present

## 2018-06-27 DIAGNOSIS — E8770 Fluid overload, unspecified: Secondary | ICD-10-CM | POA: Diagnosis not present

## 2018-06-27 DIAGNOSIS — Z794 Long term (current) use of insulin: Secondary | ICD-10-CM

## 2018-06-27 DIAGNOSIS — J40 Bronchitis, not specified as acute or chronic: Secondary | ICD-10-CM

## 2018-06-27 DIAGNOSIS — F419 Anxiety disorder, unspecified: Secondary | ICD-10-CM | POA: Diagnosis present

## 2018-06-27 DIAGNOSIS — R778 Other specified abnormalities of plasma proteins: Secondary | ICD-10-CM | POA: Diagnosis present

## 2018-06-27 DIAGNOSIS — E662 Morbid (severe) obesity with alveolar hypoventilation: Secondary | ICD-10-CM | POA: Diagnosis present

## 2018-06-27 DIAGNOSIS — Z91041 Radiographic dye allergy status: Secondary | ICD-10-CM

## 2018-06-27 DIAGNOSIS — N186 End stage renal disease: Secondary | ICD-10-CM | POA: Diagnosis present

## 2018-06-27 DIAGNOSIS — N189 Chronic kidney disease, unspecified: Secondary | ICD-10-CM | POA: Diagnosis not present

## 2018-06-27 DIAGNOSIS — R0602 Shortness of breath: Secondary | ICD-10-CM | POA: Diagnosis not present

## 2018-06-27 DIAGNOSIS — Z8249 Family history of ischemic heart disease and other diseases of the circulatory system: Secondary | ICD-10-CM | POA: Diagnosis not present

## 2018-06-27 DIAGNOSIS — E119 Type 2 diabetes mellitus without complications: Secondary | ICD-10-CM

## 2018-06-27 DIAGNOSIS — I248 Other forms of acute ischemic heart disease: Secondary | ICD-10-CM | POA: Diagnosis present

## 2018-06-27 DIAGNOSIS — R748 Abnormal levels of other serum enzymes: Secondary | ICD-10-CM | POA: Diagnosis not present

## 2018-06-27 DIAGNOSIS — I5032 Chronic diastolic (congestive) heart failure: Secondary | ICD-10-CM | POA: Diagnosis present

## 2018-06-27 DIAGNOSIS — D638 Anemia in other chronic diseases classified elsewhere: Secondary | ICD-10-CM | POA: Diagnosis not present

## 2018-06-27 DIAGNOSIS — J209 Acute bronchitis, unspecified: Secondary | ICD-10-CM | POA: Diagnosis present

## 2018-06-27 DIAGNOSIS — D631 Anemia in chronic kidney disease: Secondary | ICD-10-CM | POA: Diagnosis present

## 2018-06-27 DIAGNOSIS — I120 Hypertensive chronic kidney disease with stage 5 chronic kidney disease or end stage renal disease: Secondary | ICD-10-CM | POA: Diagnosis not present

## 2018-06-27 DIAGNOSIS — I2781 Cor pulmonale (chronic): Secondary | ICD-10-CM | POA: Diagnosis present

## 2018-06-27 DIAGNOSIS — E1165 Type 2 diabetes mellitus with hyperglycemia: Secondary | ICD-10-CM | POA: Diagnosis present

## 2018-06-27 DIAGNOSIS — Z823 Family history of stroke: Secondary | ICD-10-CM | POA: Diagnosis not present

## 2018-06-27 DIAGNOSIS — J189 Pneumonia, unspecified organism: Secondary | ICD-10-CM | POA: Diagnosis not present

## 2018-06-27 DIAGNOSIS — I50812 Chronic right heart failure: Secondary | ICD-10-CM | POA: Diagnosis present

## 2018-06-27 DIAGNOSIS — I129 Hypertensive chronic kidney disease with stage 1 through stage 4 chronic kidney disease, or unspecified chronic kidney disease: Secondary | ICD-10-CM | POA: Diagnosis not present

## 2018-06-27 DIAGNOSIS — N2581 Secondary hyperparathyroidism of renal origin: Secondary | ICD-10-CM | POA: Diagnosis not present

## 2018-06-27 DIAGNOSIS — I2510 Atherosclerotic heart disease of native coronary artery without angina pectoris: Secondary | ICD-10-CM | POA: Diagnosis present

## 2018-06-27 DIAGNOSIS — R7989 Other specified abnormal findings of blood chemistry: Secondary | ICD-10-CM

## 2018-06-27 DIAGNOSIS — N184 Chronic kidney disease, stage 4 (severe): Secondary | ICD-10-CM | POA: Diagnosis not present

## 2018-06-27 LAB — CBC
HCT: 34.1 % — ABNORMAL LOW (ref 36.0–46.0)
HEMOGLOBIN: 11 g/dL — AB (ref 12.0–15.0)
MCH: 31.6 pg (ref 26.0–34.0)
MCHC: 32.3 g/dL (ref 30.0–36.0)
MCV: 98 fL (ref 78.0–100.0)
PLATELETS: 264 10*3/uL (ref 150–400)
RBC: 3.48 MIL/uL — ABNORMAL LOW (ref 3.87–5.11)
RDW: 14.1 % (ref 11.5–15.5)
WBC: 10.6 10*3/uL — ABNORMAL HIGH (ref 4.0–10.5)

## 2018-06-27 LAB — COMPREHENSIVE METABOLIC PANEL
ALBUMIN: 3.5 g/dL (ref 3.5–5.0)
ALK PHOS: 86 U/L (ref 38–126)
ALT: 13 U/L (ref 0–44)
ANION GAP: 14 (ref 5–15)
AST: 14 U/L — ABNORMAL LOW (ref 15–41)
BUN: 34 mg/dL — ABNORMAL HIGH (ref 6–20)
CALCIUM: 8.2 mg/dL — AB (ref 8.9–10.3)
CO2: 30 mmol/L (ref 22–32)
Chloride: 96 mmol/L — ABNORMAL LOW (ref 98–111)
Creatinine, Ser: 7.65 mg/dL — ABNORMAL HIGH (ref 0.44–1.00)
GFR calc non Af Amer: 5 mL/min — ABNORMAL LOW (ref >60)
GFR, EST AFRICAN AMERICAN: 6 mL/min — AB (ref >60)
Glucose, Bld: 178 mg/dL — ABNORMAL HIGH (ref 70–99)
POTASSIUM: 2.4 mmol/L — AB (ref 3.5–5.1)
Sodium: 140 mmol/L (ref 135–145)
Total Bilirubin: 0.6 mg/dL (ref 0.3–1.2)
Total Protein: 7.7 g/dL (ref 6.5–8.1)

## 2018-06-27 LAB — MAGNESIUM: Magnesium: 1.7 mg/dL (ref 1.7–2.4)

## 2018-06-27 LAB — TROPONIN I: Troponin I: 0.08 ng/mL (ref <0.03)

## 2018-06-27 MED ORDER — ACETAMINOPHEN 325 MG PO TABS: 650.0000 mg | ORAL_TABLET | Freq: Four times a day (QID) | ORAL | Status: DC | PRN

## 2018-06-27 MED ORDER — HEPARIN 1000 UNIT/ML FOR PERITONEAL DIALYSIS
INTRAPERITONEAL | Status: DC | PRN
  Filled 2018-06-27: qty 5000

## 2018-06-27 MED ORDER — SODIUM CHLORIDE 0.9 % IV SOLN: 250.0000 mL | INTRAVENOUS | Status: DC | PRN

## 2018-06-27 MED ORDER — POTASSIUM CHLORIDE 10 MEQ/100ML IV SOLN
10.0000 meq | INTRAVENOUS | Status: AC
  Administered 2018-06-27 (×3): 10 meq via INTRAVENOUS
  Filled 2018-06-27 (×3): qty 100

## 2018-06-27 MED ORDER — POTASSIUM CHLORIDE CRYS ER 20 MEQ PO TBCR
40.0000 meq | EXTENDED_RELEASE_TABLET | Freq: Once | ORAL | Status: AC
  Administered 2018-06-27: 40 meq via ORAL
  Filled 2018-06-27: qty 2

## 2018-06-27 MED ORDER — DELFLEX-LC/4.25% DEXTROSE 483 MOSM/L IP SOLN: INTRAPERITONEAL | Status: DC

## 2018-06-27 MED ORDER — SODIUM CHLORIDE 0.9% FLUSH
3.0000 mL | Freq: Two times a day (BID) | INTRAVENOUS | Status: DC
  Administered 2018-06-28 – 2018-06-30 (×5): 3 mL via INTRAVENOUS

## 2018-06-27 MED ORDER — DELFLEX-LC/4.25% DEXTROSE 483 MOSM/L IP SOLN
INTRAPERITONEAL | Status: DC
  Administered 2018-06-27: 5000 mL via INTRAPERITONEAL
  Administered 2018-06-28 – 2018-06-29 (×2): via INTRAPERITONEAL

## 2018-06-27 MED ORDER — ALBUTEROL SULFATE (2.5 MG/3ML) 0.083% IN NEBU: 2.5000 mg | INHALATION_SOLUTION | Freq: Four times a day (QID) | RESPIRATORY_TRACT | Status: DC | PRN

## 2018-06-27 MED ORDER — HEPARIN 1000 UNIT/ML FOR PERITONEAL DIALYSIS: 500.0000 [IU] | INTRAMUSCULAR | Status: DC | PRN

## 2018-06-27 MED ORDER — ALPRAZOLAM 0.5 MG PO TABS: 0.5000 mg | ORAL_TABLET | Freq: Three times a day (TID) | ORAL | Status: DC | PRN

## 2018-06-27 MED ORDER — LANTHANUM CARBONATE 500 MG PO CHEW
1000.0000 mg | CHEWABLE_TABLET | Freq: Two times a day (BID) | ORAL | Status: DC
  Filled 2018-06-27 (×3): qty 2

## 2018-06-27 MED ORDER — GENTAMICIN SULFATE 0.1 % EX CREA
1.0000 "application " | TOPICAL_CREAM | Freq: Every day | CUTANEOUS | Status: DC
  Administered 2018-06-27 – 2018-06-29 (×4): 1 via TOPICAL
  Filled 2018-06-27: qty 15

## 2018-06-27 MED ORDER — DIPHENHYDRAMINE HCL 25 MG PO CAPS: 25.0000 mg | ORAL_CAPSULE | Freq: Four times a day (QID) | ORAL | Status: DC | PRN

## 2018-06-27 MED ORDER — LANTHANUM CARBONATE 500 MG PO CHEW
1500.0000 mg | CHEWABLE_TABLET | Freq: Three times a day (TID) | ORAL | Status: DC
  Filled 2018-06-27 (×3): qty 3

## 2018-06-27 MED ORDER — ONDANSETRON HCL 4 MG PO TABS: 4.0000 mg | ORAL_TABLET | Freq: Four times a day (QID) | ORAL | Status: DC | PRN

## 2018-06-27 MED ORDER — LANTHANUM CARBONATE 500 MG PO CHEW
1000.0000 mg | CHEWABLE_TABLET | Freq: Two times a day (BID) | ORAL | Status: DC | PRN
  Filled 2018-06-27: qty 2

## 2018-06-27 MED ORDER — IPRATROPIUM-ALBUTEROL 0.5-2.5 (3) MG/3ML IN SOLN
3.0000 mL | Freq: Once | RESPIRATORY_TRACT | Status: AC
  Administered 2018-06-27: 3 mL via RESPIRATORY_TRACT
  Filled 2018-06-27: qty 3

## 2018-06-27 MED ORDER — SODIUM CHLORIDE 0.9% FLUSH: 3.0000 mL | INTRAVENOUS | Status: DC | PRN

## 2018-06-27 MED ORDER — RENA-VITE PO TABS
1.0000 | ORAL_TABLET | Freq: Every day | ORAL | Status: DC
  Administered 2018-06-27 – 2018-06-29 (×3): 1 via ORAL
  Filled 2018-06-27 (×3): qty 1

## 2018-06-27 MED ORDER — AMLODIPINE BESYLATE 5 MG PO TABS
5.0000 mg | ORAL_TABLET | Freq: Every day | ORAL | Status: DC
  Administered 2018-06-27: 5 mg via ORAL
  Filled 2018-06-27: qty 1

## 2018-06-27 MED ORDER — OXYCODONE HCL 5 MG PO TABS
10.0000 mg | ORAL_TABLET | Freq: Four times a day (QID) | ORAL | Status: DC | PRN
  Administered 2018-06-27 – 2018-06-30 (×5): 10 mg via ORAL
  Filled 2018-06-27 (×5): qty 2

## 2018-06-27 MED ORDER — LANTHANUM CARBONATE 500 MG PO CHEW
3000.0000 mg | CHEWABLE_TABLET | Freq: Three times a day (TID) | ORAL | Status: DC
  Administered 2018-06-28 – 2018-06-30 (×6): 3000 mg via ORAL
  Filled 2018-06-27 (×7): qty 6

## 2018-06-27 NOTE — H&P (Signed)
History and Physical    [name] DOB: [birth year] DOA: 06/27/2018  PCP: [name], MD  Patient coming from: Home  Chief Complaint: Shortness of breath  HPI: [name] is a 52 y.o. female with medical history significant of end-stage renal disease on dialysis switched over to peritoneal dialysis at home about 2 weeks ago, chronic cor pulmonale, asthma  since childhood, diabetes comes in with progressive worsening shortness of breath over the last couple weeks and weight gain.  Patient reports that before her transition to peritoneal dialysis her dry weight was 150 kg.  She got up to 174 kg and is now down to 168 kg.  She reports that this is all due to transitioning to peritoneal dialysis.  She feels like she is more swollen more short of breath.  She denies any cough.  She does report a subjective fever yesterday.  She denies any nausea vomiting or diarrhea.  Patient be referred for admission for likely volume overload.  She denies any chest pain.  Review of Systems: As per HPI otherwise 10 point review of systems negative.   Past Medical History:  Diagnosis Date  . Anxiety   . Asthma    as a child  . Cancer ([place])    thyroid  . Cellulitis   . CHF (congestive heart failure) ([place])   . Chiari malformation    s/p surgery  . Chiari malformation   . Chronic pain   . Complication of anesthesia 11/28/15   Resp arrest after  conscious  sedation  . Constipation   . Coronary artery disease    40-50% mid LAD 04/29/09, Medical tx. (Dr. Gwenlyn Found)  . Diabetes mellitus    Type II- reports being off all medication d/t it being controlled  . Fibromyalgia   . History of blood transfusion    hemorrage duinrg pregancy  . Hx of echocardiogram 10/2011   EF 55-60%  . Hypercholesterolemia   . Hypertension   . Lymph edema   . Obesity hypoventilation syndrome ([place])   . Pneumonia    in past  . Pulmonary hypertension ([place])   . Renal disorder    M/W/F [facility]  started dialysis in Dec.2016  . S/P colonoscopy 05/26/2007   Dr. Laural Golden sigmoid diverticulosis random biopsies benign  . S/P endoscopy 05/01/2009   Dr. Penelope Coop pill-induced esophageal ulcerations distal to midesophagus, 2 small ulcers in the antrum of the stomach  . Shortness of breath dyspnea    with any exertion or if heart rate  is irregular while on dialysis  . Sleep apnea    reports that she no longer needs CPAP due to weight loss    Past Surgical History:  Procedure Laterality Date  . Marland KitchenHemodialysis catheter Right 11/28/2015  . A/V FISTULAGRAM N/A 07/26/2017   Procedure: A/V Fistulagram - Left Arm;  Surgeon: Serafina Mitchell, MD;  Location: [facility];  Service: Cardiovascular;  Laterality: N/A;  . ABDOMINAL HYSTERECTOMY    . ADENOIDECTOMY    . AV FISTULA PLACEMENT Left 03/15/2016   Procedure: CREATION OF LEFT ARM ARTERIOVENOUS (AV) FISTULA  ;  Surgeon: Rosetta Posner, MD;  Location: [facility];  Service: Vascular;  Laterality: Left;  . CAPD INSERTION N/A 08/30/2017   Procedure: LAPAROSCOPIC INSERTION CONTINUOUS AMBULATORY PERITONEAL DIALYSIS  (CAPD) CATHETER;  Surgeon: Clovis Riley, MD;  Location: [facility];  Service: General;  Laterality: N/A;  . CESAREAN SECTION      x 2  . CRANIECTOMY  SUBOCCIPITAL W/ CERVICAL LAMINECTOMY / CHIARI    . FISTULA SUPERFICIALIZATION Left 05/10/2016   Procedure: Left Arm FISTULA SUPERFICIALIZATION;  Surgeon: Rosetta Posner, MD;  Location: [facility];  Service: Vascular;  Laterality: Left;  . IR GENERIC HISTORICAL  08/14/2016   IR REMOVAL TUN ACCESS W/ PORT W/O FL MOD SED 08/14/2016 Arne Cleveland, MD [facility]  . PERIPHERAL VASCULAR CATHETERIZATION Left 11/25/2016   Procedure: A/V Fistulagram;  Surgeon: Conrad , MD;  Location: [facility];  Service: Cardiovascular;  Laterality: Left;  arm  . PORTACATH PLACEMENT  07/05/2012   Procedure: INSERTION PORT-A-CATH;  Surgeon: Donato Heinz, MD;  Location: [facility];  Service: General;  Laterality: Left;   subclavian  . portacath removal    . RIGHT HEART CATH N/A 08/04/2017   Procedure: RIGHT HEART CATH;  Surgeon: Jolaine Artist, MD;  Location: [facility];  Service: Cardiovascular;  Laterality: N/A;  . THYROIDECTOMY, PARTIAL    . TONSILLECTOMY       reports that she has never smoked. She has never used smokeless tobacco. She reports that she does not drink alcohol or use drugs.  Allergies  Allergen Reactions  . Contrast Media [Iodinated Diagnostic Agents] Anaphylaxis, Hives, Swelling and Other (See Comments)    Dye for cardiac cath. Tongue swells  . Pneumococcal Vaccines Swelling and Other (See Comments)    Turns skin black, and bodily swelling  . Vancomycin Nausea And Vomiting and Other (See Comments)    Infusion "made me feel like I was dying" had to be readmitted to hospital    Family History  Problem Relation Age of Onset  . Colon cancer Mother 93  . Stroke Mother 89  . Coronary artery disease Mother     Prior to Admission medications   Medication Sig Start Date End Date Taking? Authorizing Provider  acetaminophen (TYLENOL) 325 MG tablet Take 650 mg by mouth every 6 (six) hours as needed for moderate pain or fever.    Yes [provider]  albuterol (PROVENTIL) (2.5 MG/3ML) 0.083% nebulizer solution Take 2.5 mg by nebulization every 6 (six) hours as needed for wheezing or shortness of breath.   Yes [provider]  ALPRAZolam Duanne Moron) 0.5 MG tablet Take 0.5 mg by mouth 3 (three) times daily as needed for anxiety or sleep.    Yes [provider]  amLODipine (NORVASC) 10 MG tablet Take 0.5 tablets (5 mg total) by mouth daily. 06/11/16  Yes [name], MD  diphenhydrAMINE (BENADRYL) 25 mg capsule Take 25 mg by mouth every 6 (six) hours as needed for allergies.   Yes [provider]  lanthanum (FOSRENOL) 500 MG chewable tablet Chew 1,000-1,500 mg by mouth 5 (five) times daily. Patient takes 3 tablets by mouth three times a day with  meals and 2 tablets by mouth twice a day with snacks   Yes [provider]  multivitamin (RENA-VIT) TABS tablet Take 1 tablet by mouth daily. 05/30/18  Yes [provider]  ondansetron (ZOFRAN) 4 MG tablet Take 1 tablet (4 mg total) by mouth every 6 (six) hours as needed for nausea. 02/01/17  Yes [name], MD  Oxycodone HCl 10 MG TABS Take 1 tablet (10 mg total) by mouth 4 (four) times daily as needed (pain). 02/01/17  Yes [name], MD  insulin lispro (HUMALOG) 100 UNIT/ML injection Inject 60 Units into the skin 2 (two) times daily.    03/08/12  [provider]    Physical Exam:  Vitals:   06/27/18 1330 06/27/18 1400 06/27/18 1430 06/27/18 1501  BP: (!) 139/107 126/71 96/86 (!) 154/81  Pulse: 77 81 80 75  Resp: 19 (!) 23 (!) 24 (!) 28  Temp:      TempSrc:      SpO2: 98% 100% 94% 98%  Weight:      Height:          Constitutional: NAD, calm, comfortable Vitals:   06/27/18 1330 06/27/18 1400 06/27/18 1430 06/27/18 1501  BP: (!) 139/107 126/71 96/86 (!) 154/81  Pulse: 77 81 80 75  Resp: 19 (!) 23 (!) 24 (!) 28  Temp:      TempSrc:      SpO2: 98% 100% 94% 98%  Weight:      Height:       Eyes: PERRL, lids and conjunctivae normal ENMT: Mucous membranes are moist. Posterior pharynx clear of any exudate or lesions.Normal dentition.  Neck: normal, supple, no masses, no thyromegaly Respiratory: clear to auscultation bilaterally, no wheezing, no crackles. Normal respiratory effort. No accessory muscle use.  Cardiovascular: Regular rate and rhythm, no murmurs / rubs / gallops. No extremity edema. 2+ pedal pulses. No carotid bruits.  Abdomen: no tenderness, no masses palpated. No hepatosplenomegaly. Bowel sounds positive.  Musculoskeletal: no clubbing / cyanosis. No joint deformity upper and lower extremities. Good ROM, no contractures. Normal muscle tone.  Skin: no rashes, lesions, ulcers. No induration chronic severe lymphedema to bilateral lower  extremities Neurologic: CN 2-12 grossly intact. Sensation intact, DTR normal. Strength 5/5 in all 4.  Psychiatric: Normal judgment and insight. Alert and oriented x 3. Normal mood.    Labs on Admission: I have personally reviewed following labs and imaging studies  CBC: Recent Labs  Lab 06/27/18 1355  WBC 10.6*  HGB 11.0*  HCT 34.1*  MCV 98.0  PLT 747   Basic Metabolic Panel: Recent Labs  Lab 06/27/18 1355  NA 140  K 2.4*  CL 96*  CO2 30  GLUCOSE 178*  BUN 34*  CREATININE 7.65*  CALCIUM 8.2*   GFR: Estimated Creatinine Clearance: 12.3 mL/min (A) (by C-G formula based on SCr of 7.65 mg/dL (H)). Liver Function Tests: Recent Labs  Lab 06/27/18 1355  AST 14*  ALT 13  ALKPHOS 86  BILITOT 0.6  PROT 7.7  ALBUMIN 3.5   No results for input(s): LIPASE, AMYLASE in the last 168 hours. No results for input(s): AMMONIA in the last 168 hours. Coagulation Profile: No results for input(s): INR, PROTIME in the last 168 hours. Cardiac Enzymes: Recent Labs  Lab 06/27/18 1355  TROPONINI 0.08*   BNP (last 3 results) No results for input(s): PROBNP in the last 8760 hours. HbA1C: No results for input(s): HGBA1C in the last 72 hours. CBG: No results for input(s): GLUCAP in the last 168 hours. Lipid Profile: No results for input(s): CHOL, HDL, LDLCALC, TRIG, CHOLHDL, LDLDIRECT in the last 72 hours. Thyroid Function Tests: No results for input(s): TSH, T4TOTAL, FREET4, T3FREE, THYROIDAB in the last 72 hours. Anemia Panel: No results for input(s): VITAMINB12, FOLATE, FERRITIN, TIBC, IRON, RETICCTPCT in the last 72 hours. Urine analysis:    Component Value Date/Time   COLORURINE YELLOW 08/11/2016 1800   APPEARANCEUR HAZY (A) 08/11/2016 1800   LABSPEC 1.020 08/11/2016 1800   PHURINE 5.5 08/11/2016 1800   GLUCOSEU NEGATIVE 08/11/2016 1800   HGBUR MODERATE (A) 08/11/2016 1800   BILIRUBINUR NEGATIVE 08/11/2016 1800   KETONESUR NEGATIVE 08/11/2016 1800   PROTEINUR 100 (A)  08/11/2016 1800  UROBILINOGEN 0.2 12/02/2014 1925   NITRITE NEGATIVE 08/11/2016 1800   LEUKOCYTESUR NEGATIVE 08/11/2016 1800   Sepsis Labs: !!!!!!!!!!!!!!!!!!!!!!!!!!!!!!!!!!!!!!!!!!!! _0 (procalcitonin:4,lacticidven:4) )No results found for this or any previous visit (from the past 240 hour(s)).   Radiological Exams on Admission: Dg Chest 2 View  Result Date: 06/27/2018 CLINICAL DATA:  Shortness of breath.  Fever for 1 week EXAM: CHEST - 2 VIEW COMPARISON:  08/30/2017 FINDINGS: The heart size and mediastinal contours are within normal limits. Both lungs are clear. The visualized skeletal structures are unremarkable. IMPRESSION: No active cardiopulmonary disease. Electronically Signed   By: Kathreen Devoid   On: 06/27/2018 13:51    EKG: Independently reviewed.  Normal sinus rhythm with PVCs and low voltage without any acute changes  Old chart reviewed  Case discussed with Dr. Marye Round in the ED  Chest x-ray reviewed no edema or infiltrate  Assessment/Plan 52 year old female on peritoneal dialysis comes in with significant weight gain and shortness of breath Principal Problem:   SOB (shortness of breath) -chest x-ray does not show any evidence of overt edema.  O2 sats are normal.  Will transfer to [facility] for evaluation by nephrology and adjustments in her peritoneal dialysis.  Unable to perform this at [facility].  Chest x-ray also negative for any infiltrate to suggest pneumonia.  Lungs are clear at this time but was noted to be wheezing mildly on arrival by Dr. Tomi Bamberger in the ED.  This is improved with nebs.  Shortness of breath may be multifactorial but most likely appears to be mostly due to volume  status.  Active Problems:   ESRD on dialysis (HCC)-peritoneal dialysis.  Transfer to [facility] for evaluation by nephrology   Elevated troponin-mildly elevated.  No EKG changes.  Serial troponin and trend.   Hypokalemia-replete IV.  Check mag level   Diabetes mellitus  (HCC)-continue home meds   Morbid obesity (HCC)-noted   Obesity hypoventilation syndrome (HCC) -noted   Chronic diastolic CHF (congestive heart failure) (HCC) -stable   Anemia of chronic disease-stable hemoglobin over 11   Cor pulmonale, chronic (HCC)-noted    DVT prophylaxis: SCDs Code Status: Full Family Communication: None Disposition Plan: 1 to 3 days depending on nephrology evaluation Consults called: Nephrology Admission status: Observation   Xandrea Clarey A MD [facility]  If 7PM-7AM, please contact night-coverage [email or web address] Password [facility]  06/27/2018, 3:43 PM

## 2018-06-27 NOTE — ED Notes (Signed)
Critical value K+ 2.4 and Troponin 0.08 given to Dr. Tomi Bamberger.

## 2018-06-27 NOTE — Progress Notes (Addendum)
Pt received to room 3e19, order to page nephrology on arrival, called number in [place] [facility] [place] and spoke to oncall operator, Cherlyn Cushing, to advise arrival per order, on call physician returned call and said he was not aware of this pt coming, I advised she was seen by Dr Shanon Brow with order to notify nephrology on arrival and they were aware, paged Dr Shanon Brow to advise of nephrology response for further instructions [id number]- Dr Shanon Brow returned page and she said she spoke to Dr Jonnie Finner and was aware of pt and to call back and Dr Deterding will be able  To speak to his partner. Spoke to [place] to request call back to discuss Dr Mikki Santee response.

## 2018-06-27 NOTE — ED Notes (Signed)
Report given to Angela, RN.

## 2018-06-27 NOTE — ED Provider Notes (Addendum)
[facility] EMERGENCY DEPARTMENT Provider Note   CSN: [id number] Arrival date & time: 06/27/18  1245     History   Chief Complaint Chief Complaint  Patient presents with  . Shortness of Breath    HPI [name] is a 52 y.o. female.  HPI Patient presents to the emergency room for evaluation shortness of breath.  Patient has history of chronic renal failure, and chronic lymphedema and is currently on peritoneal dialysis.  Patient states for the last week or so she has been having trouble with cough and congestion.  She had fevers yesterday up to 103.  She was seen by her primary care doctor yesterday who recommended she come to the emergency room today for evaluation according to the patient.  She denies any chest pain.  She has chronic leg swelling and it may be a little worse than usual but not significantly so.  No abdominal pain vomiting or diarrhea.  Past Medical History:  Diagnosis Date  . Anxiety   . Asthma    as a child  . Cancer ([place])    thyroid  . Cellulitis   . CHF (congestive heart failure) ([place])   . Chiari malformation    s/p surgery  . Chiari malformation   . Chronic pain   . Complication of anesthesia 11/28/15   Resp arrest after  conscious  sedation  . Constipation   . Coronary artery disease    40-50% mid LAD 04/29/09, Medical tx. (Dr. Gwenlyn Found)  . Diabetes mellitus    Type II- reports being off all medication d/t it being controlled  . Fibromyalgia   . History of blood transfusion    hemorrage duinrg pregancy  . Hx of echocardiogram 10/2011   EF 55-60%  . Hypercholesterolemia   . Hypertension   . Lymph edema   . Obesity hypoventilation syndrome ([place])   . Pneumonia    in past  . Pulmonary hypertension ([place])   . Renal disorder    M/W/F [facility] Pt started dialysis in Dec.2016  . S/P colonoscopy 05/26/2007   Dr. Laural Golden sigmoid diverticulosis random biopsies benign  . S/P endoscopy 05/01/2009   Dr. Penelope Coop pill-induced esophageal  ulcerations distal to midesophagus, 2 small ulcers in the antrum of the stomach  . Shortness of breath dyspnea    with any exertion or if heart rate  is irregular while on dialysis  . Sleep apnea    reports that she no longer needs CPAP due to weight loss    Patient Active Problem List   Diagnosis Date Noted  . Septic shock ([place]) 01/24/2017  . Shock liver 01/24/2017  . Cor pulmonale, chronic ([place]) 01/24/2017  . Elevated troponin   . Flu-like symptoms 01/18/2017  . Fever 01/18/2017  . HCAP (healthcare-associated pneumonia) 12/31/2016  . Numbness and tingling in right hand 08/13/2016  . Sepsis ([place]) 08/11/2016  . Coagulase negative Staphylococcus bacteremia 06/11/2016  . UTI (lower urinary tract infection) 06/06/2016  . ESRD (end stage renal disease) ([place]) 06/06/2016  . Chronic cholecystitis 12/09/2015  . Protein-calorie malnutrition, severe 11/29/2015  . Respiratory arrest ([place]) 11/28/2015  . Recurrent cellulitis of lower leg 11/13/2015  . Chronic diastolic CHF (congestive heart failure) ([place]) 11/13/2015  . Chronic respiratory failure with hypoxia ([place]) 11/13/2015  . ESRD on dialysis ([place]) 11/13/2015  . Anemia of chronic disease 11/13/2015  . Rotator cuff syndrome of right shoulder 05/30/2013  . Pain in joint, shoulder region 05/30/2013  . Obesity hypoventilation syndrome ([place]) 09/04/2012  .  Leukopenia 11/11/2011  . HTN (hypertension) 11/11/2011  . HLD (hyperlipidemia) 11/11/2011  . Transaminitis 11/11/2011  . Diabetes mellitus ([place]) 10/27/2011  . Morbid obesity ([place]) 10/27/2011  . Lymphedema of lower extremity 10/04/2011    Past Surgical History:  Procedure Laterality Date  . Marland KitchenHemodialysis catheter Right 11/28/2015  . A/V FISTULAGRAM N/A 07/26/2017   Procedure: A/V Fistulagram - Left Arm;  Surgeon: Serafina Mitchell, MD;  Location: [place] [facility];  Service: Cardiovascular;  Laterality: N/A;  . ABDOMINAL HYSTERECTOMY    . ADENOIDECTOMY    . AV FISTULA PLACEMENT Left  03/15/2016   Procedure: CREATION OF LEFT ARM ARTERIOVENOUS (AV) FISTULA  ;  Surgeon: Rosetta Posner, MD;  Location: [place];  Service: Vascular;  Laterality: Left;  . CAPD INSERTION N/A 08/30/2017   Procedure: LAPAROSCOPIC INSERTION CONTINUOUS AMBULATORY PERITONEAL DIALYSIS  (CAPD) CATHETER;  Surgeon: Clovis Riley, MD;  Location: [place];  Service: General;  Laterality: N/A;  . CESAREAN SECTION      x 2  . CRANIECTOMY SUBOCCIPITAL W/ CERVICAL LAMINECTOMY / CHIARI    . FISTULA SUPERFICIALIZATION Left 05/10/2016   Procedure: Left Arm FISTULA SUPERFICIALIZATION;  Surgeon: Rosetta Posner, MD;  Location[facility] OR;  Service: Vascular;  Laterality: Left;  . IR GENERIC HISTORICAL  08/14/2016   IR REMOVAL TUN ACCESS W/ PORT W/O FL MOD SED 08/14/2016 Arne Cleveland, MD [facility]-INTERV RAD  . PERIPHERAL VASCULAR CATHETERIZATION Left 11/25/2016   Procedure: A/V Fistulagram;  Surgeon: Conrad [place] Fe, MD;  Location: [place] [facility];  Service: Cardiovascular;  Laterality: Left;  arm  . PORTACATH PLACEMENT  07/05/2012   Procedure: INSERTION PORT-A-CATH;  Surgeon: Donato Heinz, MD;  Location: [facility];  Service: General;  Laterality: Left;  subclavian  . portacath removal    . RIGHT HEART CATH N/A 08/04/2017   Procedure: RIGHT HEART CATH;  Surgeon: Jolaine Artist, MD;  Location: [place] [facility];  Service: Cardiovascular;  Laterality: N/A;  . THYROIDECTOMY, PARTIAL    . TONSILLECTOMY       OB History    Gravida  4   Para  3   Term  2   Preterm  1   AB  1   Living        SAB  1   TAB      Ectopic      Multiple      Live Births               Home Medications    Prior to Admission medications   Medication Sig Start Date End Date Taking? Authorizing Provider  acetaminophen (TYLENOL) 325 MG tablet Take 650 mg by mouth every 6 (six) hours as needed for moderate pain or fever.    Yes [provider]  albuterol (PROVENTIL) (2.5 MG/3ML) 0.083% nebulizer solution Take 2.5 mg by  nebulization every 6 (six) hours as needed for wheezing or shortness of breath.   Yes [provider]  ALPRAZolam Duanne Moron) 0.5 MG tablet Take 0.5 mg by mouth 3 (three) times daily as needed for anxiety or sleep.    Yes [provider]  amLODipine (NORVASC) 10 MG tablet Take 0.5 tablets (5 mg total) by mouth daily. 06/11/16  Yes [name], MD  diphenhydrAMINE (BENADRYL) 25 mg capsule Take 25 mg by mouth every 6 (six) hours as needed for allergies.   Yes [provider]  lanthanum (FOSRENOL) 500 MG chewable tablet Chew 1,000-1,500 mg by mouth 5 (five) times  daily. Patient takes 3 tablets by mouth three times a day with meals and 2 tablets by mouth twice a day with snacks   Yes [provider]  multivitamin (RENA-VIT) TABS tablet Take 1 tablet by mouth daily. 05/30/18  Yes [provider]  ondansetron (ZOFRAN) 4 MG tablet Take 1 tablet (4 mg total) by mouth every 6 (six) hours as needed for nausea. 02/01/17  Yes [name], MD  Oxycodone HCl 10 MG TABS Take 1 tablet (10 mg total) by mouth 4 (four) times daily as needed (pain). 02/01/17  Yes [name], MD  insulin lispro (HUMALOG) 100 UNIT/ML injection Inject 60 Units into the skin 2 (two) times daily.    03/08/12  [provider]    Family History Family History  Problem Relation Age of Onset  . Colon cancer Mother 73  . Stroke Mother 90  . Coronary artery disease Mother     Social History Social History   Tobacco Use  . Smoking status: Never Smoker  . Smokeless tobacco: Never Used  Substance Use Topics  . Alcohol use: No    Alcohol/week: 0.0 oz  . Drug use: No     Allergies   Contrast media [iodinated diagnostic agents]; Pneumococcal vaccines; and Vancomycin   Review of Systems Review of Systems  All other systems reviewed and are negative.    Physical Exam Updated Vital Signs BP (!) 154/81   Pulse 75   Temp 98.1 F (36.7 C) (Oral)   Resp (!) 28   Ht 1.651  m (_0 )   Wt (!) 137.9 kg (304 lb)   SpO2 98%   BMI 50.59 kg/m   Physical Exam  Constitutional:  Non-toxic appearance. She does not appear ill.  Obese   HENT:  Head: Normocephalic and atraumatic.  Right Ear: External ear normal.  Left Ear: External ear normal.  Eyes: Conjunctivae are normal. Right eye exhibits no discharge. Left eye exhibits no discharge. No scleral icterus.  Neck: Neck supple. No tracheal deviation present.  Cardiovascular: Normal rate, regular rhythm and intact distal pulses.  Pulmonary/Chest: Effort normal. No stridor. No respiratory distress. She has wheezes. She has no rales.  Abdominal: Soft. Bowel sounds are normal. She exhibits no distension. There is no tenderness. There is no rebound and no guarding.  Musculoskeletal: She exhibits no tenderness.       Right lower leg: She exhibits edema.       Left lower leg: She exhibits edema.  Severe edema, appears to be chronic with evidence of lichenifed thickened skin  Neurological: She is alert. She has normal strength. No cranial nerve deficit (no facial droop, extraocular movements intact, no slurred speech) or sensory deficit. She exhibits normal muscle tone. She displays no seizure activity. Coordination normal.  Skin: Skin is warm and dry. No rash noted.  Psychiatric: She has a normal mood and affect.  Nursing note and vitals reviewed.    ED Treatments / Results  Labs (all labs ordered are listed, but only abnormal results are displayed) Labs Reviewed  CBC - Abnormal; Notable for the following components:      Result Value   WBC 10.6 (*)    RBC 3.48 (*)    Hemoglobin 11.0 (*)    HCT 34.1 (*)    All other components within normal limits  COMPREHENSIVE METABOLIC PANEL - Abnormal; Notable for the following components:   Potassium 2.4 (*)    Chloride 96 (*)    Glucose, Bld 178 (*)  BUN 34 (*)    Creatinine, Ser 7.65 (*)    Calcium 8.2 (*)    AST 14 (*)    GFR calc non Af Amer 5 (*)    GFR calc  Af Amer 6 (*)    All other components within normal limits  TROPONIN I - Abnormal; Notable for the following components:   Troponin I 0.08 (*)    All other components within normal limits    EKG EKG Interpretation  Date/Time:  Tuesday June 27 2018 12:46:32 EDT Ventricular Rate:  86 PR Interval:  148 QRS Duration: 74 QT Interval:  408 QTC Calculation: 488 R Axis:   79 Text Interpretation:  Sinus rhythm with Premature atrial complexes with Abberant conduction Low voltage QRS Cannot rule out Anterior infarct , age undetermined Abnormal ECG No significant change since last tracing Confirmed by Dorie Rank [id number]) on 06/27/2018 3:11:36 PM   Radiology Dg Chest 2 View  Result Date: 06/27/2018 CLINICAL DATA:  Shortness of breath.  Fever for 1 week EXAM: CHEST - 2 VIEW COMPARISON:  08/30/2017 FINDINGS: The heart size and mediastinal contours are within normal limits. Both lungs are clear. The visualized skeletal structures are unremarkable. IMPRESSION: No active cardiopulmonary disease. Electronically Signed   By: Kathreen Devoid   On: 06/27/2018 13:51    Procedures Procedures (including critical care time)  Medications Ordered in ED Medications  ipratropium-albuterol (DUONEB) 0.5-2.5 (3) MG/3ML nebulizer solution 3 mL (3 mLs Nebulization Given 06/27/18 1358)  potassium chloride SA (K-DUR,KLOR-CON) CR tablet 40 mEq (40 mEq Oral Given 06/27/18 1529)     Initial Impression / Assessment and Plan / ED Course  I have reviewed the triage vital signs and the nursing notes.  Pertinent labs & imaging results that were available during my care of the patient were reviewed by me and considered in my medical decision making (see chart for details).  Clinical Course as of Jun 30 737  Tue Jun 27, 2018  1508 Trop elevated.  Previous values have all been elevated.  Potassium is low.  BUN and CR are up compared to prior. C/w her chronic renal failure.   [YY]  5035 D/w Dr Shanon Brow hospitalist.  D/w Dr Jonnie Finner,  nephrology.   [JK]    Clinical Course User Index [JK] Dorie Rank, MD    Patient presented to the emergency room for evaluation of shortness of breath and increasing peripheral edema.  Patient has history of chronic kidney disease.  She recently converted to home peritoneal dialysis a couple weeks ago.  Patient started having trouble with increasing cough and congestion.  She was sent in for possible pneumonia.  Chest x-ray does not show evidence of pneumonia.  There is also no evidence of significant pulmonary edema.  Patient however is tachypneic and she has elevated troponins.  Her blood tests are consistent with her chronic kidney disease but she is also hypokalemic.  Patient denies any chest pain.  I am suspicious that her troponin elevation is more likely related to her chronic kidney disease and a component of volume overload.  I will consult the medical service for admission so we can continue to monitor her troponin elevation and see if she is able to dialyze her fluid off.  Patient may end up requiring hemodialysis if she does not improve.  Final Clinical Impressions(s) / ED Diagnoses   Final diagnoses:  Bronchitis  Chronic renal failure, unspecified CKD stage  Elevated troponin  Hypokalemia      Dorie Rank, MD  06/27/18 1530    Dorie Rank, MD 06/30/18 [phone]

## 2018-06-27 NOTE — ED Triage Notes (Signed)
Pt is having difficulty breathing. Per PCP, may be coming down with pneumonia. Saw pt yesterday.  Dialysis pt. Last treatment last night. Per EMS O2 sats normal. Pt has severe lymphodema.

## 2018-06-27 NOTE — ED Notes (Signed)
Patient in Radiology.

## 2018-06-27 NOTE — ED Notes (Signed)
Patient dozing off and not maintaining O2 sat, placed on O2 via [place] at 2L.

## 2018-06-27 NOTE — Consult Note (Signed)
Reason for Consult:ESRD, Volume overload Referring Physician: Dr. Renee Ramus is an 52 y.o. female.  HPI: 52 yr female patient of Dr. Hinda Lenis, transferred from [facility], not communicated to me, with hx 12 yr DM, 15 yr HTN, on HD 2 yr and transitioned to PD about 2 wk ago.  Had major issues with PD caths prior to this one working.  Wgt has gone up 24 kg, and even now 18 kg from HD wgt.  Notes edema of LE (has chronic lymphedema), worsening so difficulty bending legs.  SOB and coughing x 2 wk.  Initially thought to be pneu but now thought to be fluid xs.  BPs in 140s sys.  Only using 1.5 and 2.5% bags.  Does not know vol infused.  Not checking sugars.  Had chills and temps up to 101.  No appetite, no N, V, D.   Constitutional: as above, does not feel good Eyes: some limitation in vision Ears, nose, mouth, throat, and face: negative Respiratory: as above, no phlegm Cardiovascular: as above, cannot lay down to breathe Gastrointestinal: negative Genitourinary:very little urine Integument/breast: negative Musculoskeletal:fibromyalgia, esp UE Neurological: negative Endocrine: DM Allergic/Immunologic: negative, Iv Dye, Vanco, pneu vacc,    Dialyzes at [facility] on PD since 2 wk. Primary Nephrologist [place]. EDW 150kg. Marland Kitchen Access PD cath, LLA avf.  Past Medical History:  Diagnosis Date  . Anxiety   . Asthma    as a child  . Cancer ([place])    thyroid  . Cellulitis   . CHF (congestive heart failure) ([place])   . Chiari malformation    s/p surgery  . Chiari malformation   . Chronic pain   . Complication of anesthesia 11/28/15   Resp arrest after  conscious  sedation  . Constipation   . Coronary artery disease    40-50% mid LAD 04/29/09, Medical tx. (Dr. Gwenlyn Found)  . Diabetes mellitus    Type II- reports being off all medication d/t it being controlled  . Fibromyalgia   . History of blood transfusion    hemorrage duinrg pregancy  . Hx of echocardiogram 10/2011   EF 55-60%  .  Hypercholesterolemia   . Hypertension   . Lymph edema   . Obesity hypoventilation syndrome ([place])   . Pneumonia    in past  . Pulmonary hypertension ([place])   . Renal disorder    M/W/F [facility] Pt started dialysis in Dec.2016  . S/P colonoscopy 05/26/2007   Dr. Laural Golden sigmoid diverticulosis random biopsies benign  . S/P endoscopy 05/01/2009   Dr. Penelope Coop pill-induced esophageal ulcerations distal to midesophagus, 2 small ulcers in the antrum of the stomach  . Shortness of breath dyspnea    with any exertion or if heart rate  is irregular while on dialysis  . Sleep apnea    reports that she no longer needs CPAP due to weight loss    Past Surgical History:  Procedure Laterality Date  . Marland KitchenHemodialysis catheter Right 11/28/2015  . A/V FISTULAGRAM N/A 07/26/2017   Procedure: A/V Fistulagram - Left Arm;  Surgeon: Serafina Mitchell, MD;  Location: [place] [facility];  Service: Cardiovascular;  Laterality: N/A;  . ABDOMINAL HYSTERECTOMY    . ADENOIDECTOMY    . AV FISTULA PLACEMENT Left 03/15/2016   Procedure: CREATION OF LEFT ARM ARTERIOVENOUS (AV) FISTULA  ;  Surgeon: Rosetta Posner, MD;  Location: [place];  Service: Vascular;  Laterality: Left;  . CAPD INSERTION N/A 08/30/2017   Procedure: LAPAROSCOPIC INSERTION  CONTINUOUS AMBULATORY PERITONEAL DIALYSIS  (CAPD) CATHETER;  Surgeon: Clovis Riley, MD;  Location: [place];  Service: General;  Laterality: N/A;  . CESAREAN SECTION      x 2  . CRANIECTOMY SUBOCCIPITAL W/ CERVICAL LAMINECTOMY / CHIARI    . FISTULA SUPERFICIALIZATION Left 05/10/2016   Procedure: Left Arm FISTULA SUPERFICIALIZATION;  Surgeon: Rosetta Posner, MD;  Location: [facility] OR;  Service: Vascular;  Laterality: Left;  . IR GENERIC HISTORICAL  08/14/2016   IR REMOVAL TUN ACCESS W/ PORT W/O FL MOD SED 08/14/2016 Arne Cleveland, MD [facility]-INTERV RAD  . PERIPHERAL VASCULAR CATHETERIZATION Left 11/25/2016   Procedure: A/V Fistulagram;  Surgeon: Conrad Gilt Edge, MD;  Location: [place] [facility];   Service: Cardiovascular;  Laterality: Left;  arm  . PORTACATH PLACEMENT  07/05/2012   Procedure: INSERTION PORT-A-CATH;  Surgeon: Donato Heinz, MD;  Location: [facility];  Service: General;  Laterality: Left;  subclavian  . portacath removal    . RIGHT HEART CATH N/A 08/04/2017   Procedure: RIGHT HEART CATH;  Surgeon: Jolaine Artist, MD;  Location: [place] [facility];  Service: Cardiovascular;  Laterality: N/A;  . THYROIDECTOMY, PARTIAL    . TONSILLECTOMY      Family History  Problem Relation Age of Onset  . Colon cancer Mother 55  . Stroke Mother 61  . Coronary artery disease Mother     Social History:  reports that she has never smoked. She has never used smokeless tobacco. She reports that she does not drink alcohol or use drugs.  Allergies:  Allergies  Allergen Reactions  . Contrast Media [Iodinated Diagnostic Agents] Anaphylaxis, Hives, Swelling and Other (See Comments)    Dye for cardiac cath. Tongue swells  . Pneumococcal Vaccines Swelling and Other (See Comments)    Turns skin black, and bodily swelling  . Vancomycin Nausea And Vomiting and Other (See Comments)    Infusion "made me feel like I was dying" had to be readmitted to hospital    Medications:  I have reviewed the patient's current medications. Prior to Admission:  Medications Prior to Admission  Medication Sig Dispense Refill Last Dose  . acetaminophen (TYLENOL) 325 MG tablet Take 650 mg by mouth every 6 (six) hours as needed for moderate pain or fever.    Past Week at Unknown time  . albuterol (PROVENTIL) (2.5 MG/3ML) 0.083% nebulizer solution Take 2.5 mg by nebulization every 6 (six) hours as needed for wheezing or shortness of breath.   More than a month at Unknown time  . ALPRAZolam (XANAX) 0.5 MG tablet Take 0.5 mg by mouth 3 (three) times daily as needed for anxiety or sleep.    Past Week at Unknown time  . amLODipine (NORVASC) 10 MG tablet Take 0.5 tablets (5 mg total) by mouth daily. 15 tablet 12 Past  Week at Unknown time  . diphenhydrAMINE (BENADRYL) 25 mg capsule Take 25 mg by mouth every 6 (six) hours as needed for allergies.   More than a month at Unknown time  . lanthanum (FOSRENOL) 500 MG chewable tablet Chew 1,000-1,500 mg by mouth 5 (five) times daily. Patient takes 3 tablets by mouth three times a day with meals and 2 tablets by mouth twice a day with snacks   Past Week at Unknown time  . multivitamin (RENA-VIT) TABS tablet Take 1 tablet by mouth daily.  11 Past Week at Unknown time  . ondansetron (ZOFRAN) 4 MG tablet Take 1 tablet (4 mg total) by mouth every  6 (six) hours as needed for nausea. 20 tablet 0 More than a month at Unknown time  . Oxycodone HCl 10 MG TABS Take 1 tablet (10 mg total) by mouth 4 (four) times daily as needed (pain).   Past Week at Unknown time   Results for orders placed or performed during the hospital encounter of 06/27/18 (from the past 48 hour(s))  CBC     Status: Abnormal   Collection Time: 06/27/18  1:55 PM  Result Value Ref Range   WBC 10.6 (H) 4.0 - 10.5 K/uL   RBC 3.48 (L) 3.87 - 5.11 MIL/uL   Hemoglobin 11.0 (L) 12.0 - 15.0 g/dL   HCT 34.1 (L) 36.0 - 46.0 %   MCV 98.0 78.0 - 100.0 fL   MCH 31.6 26.0 - 34.0 pg   MCHC 32.3 30.0 - 36.0 g/dL   RDW 14.1 11.5 - 15.5 %   Platelets 264 150 - 400 K/uL    Comment: Performed at [facility], [address] [place], [place] [address]  Comprehensive metabolic panel     Status: Abnormal   Collection Time: 06/27/18  1:55 PM  Result Value Ref Range   Sodium 140 135 - 145 mmol/L   Potassium 2.4 (LL) 3.5 - 5.1 mmol/L    Comment: CRITICAL RESULT CALLED TO, READ BACK BY AND VERIFIED WITH: BORTZ,C ON 06/27/18 AT 1500 BY LOY,C    Chloride 96 (L) 98 - 111 mmol/L    Comment: Please note change in reference range.   CO2 30 22 - 32 mmol/L   Glucose, Bld 178 (H) 70 - 99 mg/dL    Comment: Please note change in reference range.   BUN 34 (H) 6 - 20 mg/dL    Comment: Please note change in reference range.    Creatinine, Ser 7.65 (H) 0.44 - 1.00 mg/dL   Calcium 8.2 (L) 8.9 - 10.3 mg/dL   Total Protein 7.7 6.5 - 8.1 g/dL   Albumin 3.5 3.5 - 5.0 g/dL   AST 14 (L) 15 - 41 U/L   ALT 13 0 - 44 U/L    Comment: Please note change in reference range.   Alkaline Phosphatase 86 38 - 126 U/L   Total Bilirubin 0.6 0.3 - 1.2 mg/dL   GFR calc non Af Amer 5 (L) >60 mL/min   GFR calc Af Amer 6 (L) >60 mL/min    Comment: (NOTE) The eGFR has been calculated using the CKD EPI equation. This calculation has not been validated in all clinical situations. eGFR's persistently <60 mL/min signify possible Chronic Kidney Disease.    Anion gap 14 5 - 15    Comment: Performed at [facility], [address], [place], [place] [address]  Troponin I     Status: Abnormal   Collection Time: 06/27/18  1:55 PM  Result Value Ref Range   Troponin I 0.08 (HH) <0.03 ng/mL    Comment: CRITICAL RESULT CALLED TO, READ BACK BY AND VERIFIED WITH: BORTZ,C ON 06/27/18 AT 1500 BY LOY,C Performed at [facility], [address], [place], [place] [address]   Magnesium     Status: None   Collection Time: 06/27/18  3:43 PM  Result Value Ref Range   Magnesium 1.7 1.7 - 2.4 mg/dL    Comment: Performed at [facility], [address] [place], [place] [address]    Dg Chest 2 View  Result Date: 06/27/2018 CLINICAL DATA:  Shortness of breath.  Fever for 1 week EXAM: CHEST - 2 VIEW  COMPARISON:  08/30/2017 FINDINGS: The heart size and mediastinal contours are within normal limits. Both lungs are clear. The visualized skeletal structures are unremarkable. IMPRESSION: No active cardiopulmonary disease. Electronically Signed   By: Kathreen Devoid   On: 06/27/2018 13:51    ROS Blood pressure 136/71, pulse 81, temperature 98.4 F (36.9 C), temperature source Oral, resp. rate 20, height _0  (1.651 m), weight (!) 169.8 kg (374 lb 5.5 oz), SpO2 100 %. Physical Exam Physical Examination: General appearance - obese NAD Mental status - alert, oriented  to person, place, and time Eyes - pupils equal and reactive, extraocular eye movements intact, funduscopic exam normal, discs flat and sharp Mouth - mucous membranes moist, pharynx normal without lesions Neck - adenopathy noted PCL Lymphatics - posterior cervical nodes Chest - decreased bs, bibasilar rales Heart - S1 and S2 normal, systolic murmur VV6/1 at apex Abdomen - obese, pos bs, PD cath L lat abdm Extremities - chronic edema and pittiing edema, icthyotic changes, AVF LLA Skin - dry, changes as above  Assessment/Plan: 1 ESRD  Not well controlled or managed PD.  Needs hypertonic soln, freq exchanges to get out of trouble, then better training to maintain.  Will make sure can UF.   2 DM need to monitor bs on PD with DM.  3 Hypertension: not much of issue 4. Anemia of ESRD: Hb acceptable 5. Metabolic Bone Disease: no values, check Phos, PTH 6 Massive obesity   7 Fibromyalgia P PD ,4.25, cont cycling, monitor bs, check UF,    [place] 06/27/2018, 7:34 PM

## 2018-06-27 NOTE — ED Notes (Addendum)
NT about to do vitals as well as EKG

## 2018-06-27 NOTE — ED Notes (Signed)
Report given to [facility]. 

## 2018-06-28 DIAGNOSIS — Z992 Dependence on renal dialysis: Secondary | ICD-10-CM

## 2018-06-28 DIAGNOSIS — N186 End stage renal disease: Secondary | ICD-10-CM

## 2018-06-28 DIAGNOSIS — N184 Chronic kidney disease, stage 4 (severe): Secondary | ICD-10-CM

## 2018-06-28 DIAGNOSIS — I5032 Chronic diastolic (congestive) heart failure: Secondary | ICD-10-CM

## 2018-06-28 DIAGNOSIS — R0602 Shortness of breath: Secondary | ICD-10-CM

## 2018-06-28 DIAGNOSIS — Z794 Long term (current) use of insulin: Secondary | ICD-10-CM

## 2018-06-28 DIAGNOSIS — E876 Hypokalemia: Secondary | ICD-10-CM

## 2018-06-28 DIAGNOSIS — E1122 Type 2 diabetes mellitus with diabetic chronic kidney disease: Secondary | ICD-10-CM

## 2018-06-28 DIAGNOSIS — D638 Anemia in other chronic diseases classified elsewhere: Secondary | ICD-10-CM

## 2018-06-28 DIAGNOSIS — J40 Bronchitis, not specified as acute or chronic: Secondary | ICD-10-CM

## 2018-06-28 LAB — RENAL FUNCTION PANEL
ANION GAP: 12 (ref 5–15)
Albumin: 2.9 g/dL — ABNORMAL LOW (ref 3.5–5.0)
BUN: 31 mg/dL — ABNORMAL HIGH (ref 6–20)
CHLORIDE: 99 mmol/L (ref 98–111)
CO2: 30 mmol/L (ref 22–32)
Calcium: 7.9 mg/dL — ABNORMAL LOW (ref 8.9–10.3)
Creatinine, Ser: 7.68 mg/dL — ABNORMAL HIGH (ref 0.44–1.00)
GFR, EST AFRICAN AMERICAN: 6 mL/min — AB (ref >60)
GFR, EST NON AFRICAN AMERICAN: 5 mL/min — AB (ref >60)
Glucose, Bld: 310 mg/dL — ABNORMAL HIGH (ref 70–99)
POTASSIUM: 2.7 mmol/L — AB (ref 3.5–5.1)
Phosphorus: 4.3 mg/dL (ref 2.5–4.6)
Sodium: 141 mmol/L (ref 135–145)

## 2018-06-28 LAB — MRSA PCR SCREENING: MRSA by PCR: POSITIVE — AB

## 2018-06-28 LAB — HEMOGLOBIN A1C
Hgb A1c MFr Bld: 9 % — ABNORMAL HIGH (ref 4.8–5.6)
Mean Plasma Glucose: 211.6 mg/dL

## 2018-06-28 LAB — GLUCOSE, CAPILLARY: Glucose-Capillary: 265 mg/dL — ABNORMAL HIGH (ref 70–99)

## 2018-06-28 MED ORDER — CHLORHEXIDINE GLUCONATE CLOTH 2 % EX PADS
6.0000 | MEDICATED_PAD | Freq: Every day | CUTANEOUS | Status: DC
  Administered 2018-06-28 – 2018-06-30 (×3): 6 via TOPICAL

## 2018-06-28 MED ORDER — CEPHALEXIN 500 MG PO CAPS
500.0000 mg | ORAL_CAPSULE | Freq: Two times a day (BID) | ORAL | Status: DC
  Administered 2018-06-28 – 2018-06-30 (×5): 500 mg via ORAL
  Filled 2018-06-28 (×5): qty 1

## 2018-06-28 MED ORDER — MUPIROCIN 2 % EX OINT
1.0000 "application " | TOPICAL_OINTMENT | Freq: Two times a day (BID) | CUTANEOUS | Status: DC
  Administered 2018-06-28 – 2018-06-30 (×5): 1 via NASAL
  Filled 2018-06-28 (×2): qty 22

## 2018-06-28 MED ORDER — INSULIN ASPART 100 UNIT/ML ~~LOC~~ SOLN
0.0000 [IU] | Freq: Three times a day (TID) | SUBCUTANEOUS | Status: DC
  Administered 2018-06-29: 3 [IU] via SUBCUTANEOUS
  Administered 2018-06-29: 5 [IU] via SUBCUTANEOUS

## 2018-06-28 MED ORDER — POTASSIUM CHLORIDE CRYS ER 20 MEQ PO TBCR
40.0000 meq | EXTENDED_RELEASE_TABLET | Freq: Two times a day (BID) | ORAL | Status: AC
  Administered 2018-06-28 (×2): 40 meq via ORAL
  Filled 2018-06-28 (×2): qty 2

## 2018-06-28 NOTE — Progress Notes (Signed)
Nutrition Brief Note  Patient identified on the Malnutrition Screening Tool (MST) Report  Wt Readings from Last 15 Encounters:  06/28/18 (!) 381 lb 9.9 oz (173.1 kg)  08/30/17 (!) 304 lb 3.8 oz (138 kg)  08/04/17 (!) 302 lb 14.6 oz (137.4 kg)  07/26/17 (!) 302 lb 0.5 oz (137 kg)  07/14/17 (!) 306 lb (138.8 kg)  07/08/17 (!) 307 lb 12.8 oz (139.6 kg)  02/01/17 (!) 327 lb 6.1 oz (148.5 kg)  01/06/17 (!) 304 lb 3.8 oz (138 kg)  11/25/16 299 lb 13.2 oz (136 kg)  08/17/16 252 lb (114.3 kg)  08/15/16 297 lb 13.5 oz (135.1 kg)  07/02/16 273 lb (123.8 kg)  06/11/16 (!) 300 lb 14.9 oz (136.5 kg)  06/01/16 269 lb (122 kg)  05/10/16 269 lb (122 kg)   [name] is a 52 y.o. female with medical history significant of end-stage renal disease on dialysis switched over to peritoneal dialysis at home about 2 weeks ago, chronic cor pulmonale, asthma  since childhood, diabetes comes in with progressive worsening shortness of breath over the last couple weeks and weight gain.  Patient reports that before her transition to peritoneal dialysis her dry weight was 150 kg.  She got up to 174 kg and is now down to 168 kg.  She reports that this is all due to transitioning to peritoneal dialysis.  She feels like she is more swollen more short of breath.  She denies any cough.  She does report a subjective fever yesterday.  She denies any nausea vomiting or diarrhea.  Patient be referred for admission for likely volume overload.  Pt admitted with SOB and volume overload.   Spoke with pt at bedside, who is in good spirits today. She reports very poor oral intake for about 1 week PTA. She said she was unable to keep foods and liquids down over the past 5 days. Her appetite has since returned and she consumed 100% of her breakfast.   Pt reports that she has had difficulty with fluid overload since transitioning from HD to PD 2-3 weeks ago. She has made great changes to her diet to watch the sodium and phosphorus  content- Breakfast: grits, eggs, and tenderloin, Lunch and Dinner: baked meat and two vegetables. Pt tries to snack on fruit (aminyl plums and grapes).   Nutrition-Focused physical exam completed. Findings are no fat depletion, no muscle depletion, and moderate edema. Pt uses a motorized wheelchair for mobility, but has recently started using a walker.   Last Hgb A1c: 8.1 (01/04/17)- no recent Hgb A1c recorded. PTA DM medications 60 units insulin lispro BID.   Labs reviewed: K: 2.7.   Body mass index is 63.5 kg/m. Patient meets criteria for extreme obesity, class III based on current BMI.   Current diet order is renal with 1200 ml fluid restriction, patient is consuming approximately 100% of meals at this time. Labs and medications reviewed.   No nutrition interventions warranted at this time. If nutrition issues arise, please consult RD.   [name] A. [name], RD, LDN, CDE Pager: [phone] After hours Pager: [phone]

## 2018-06-28 NOTE — Plan of Care (Signed)

## 2018-06-28 NOTE — Progress Notes (Signed)
[facility]  PROGRESS NOTE  [name] HMC:[id number] DOB: March 01, 1966 DOA: 06/27/2018 PCP: [name], MD   Brief HPI:   52 year old female with a history of ESRD on dialysis switched over to peritoneal dialysis at home about 2 weeks ago, chronic cor pulmonale, asthma since childhood, diabetes mellitus came to hospital with worsening shortness of breath.  Patient stated that her initial drive it was 662 EKG which went up to 174 kg and now is down to 168 kg.  All this happened after she was transitioned to peritoneal dialysis.    Subjective   Patient seen and examined, complains of coughing up clear phlegm.  Also complains of right ear pain.  Patient states she was coughing up yellow phlegm earlier.   Assessment/Plan:     1. Dyspnea-chest x-ray negative for pneumonia, likely from volume overload.  Patient is currently on peritoneal dialysis.  Nephrology following. 2. Acute bronchitis-patient complaining of yellow phlegm few days ago along with right ear pain, will start patient on Keflex 500 mg p.o. twice daily. 3. Hypokalemia-potassium is 2.7, will replace potassium.  Follow BMP in am. 4. Diabetes mellitus-start sliding scale insulin with NovoLog 5. Elevated troponin-patient had mild elevation of troponin, no chest pain, no EKG changes.  Will cycle troponin every 6 hours.    DVT prophylaxis: SCDs  Code Status: Full code  Family Communication: No family at bedside  Disposition Plan: likely home when medically ready for discharge   Consultants: Nephrology Procedures:  Peritoneal dialysis   Antibiotics:   Anti-infectives (From admission, onward)   Start     Dose/Rate Route Frequency Ordered Stop   06/28/18 1000  cephALEXin (KEFLEX) capsule 500 mg     500 mg Oral Every 12 hours 06/28/18 0928         Objective   Vitals:   06/28/18 0300 06/28/18 0734 06/28/18 1129 06/28/18 1427  BP: (!) 141/75 137/71 (!) 134/58 (!) 143/70  Pulse: 81 79 84 82  Resp:  _0 Temp: 98.2 F (36.8 C) 98 F (36.7 C) 98.1 F (36.7 C) (!) 97.4 F (36.3 C)  TempSrc: Oral Oral Oral Oral  SpO2:  99% 99%   Weight: (!) 173.1 kg (381 lb 9.9 oz)   (!) 169.3 kg (373 lb 3.8 oz)  Height:        Intake/Output Summary (Last 24 hours) at 06/28/2018 1847 Last data filed at 06/28/2018 1500 Gross per 24 hour  Intake 480 ml  Output 0 ml  Net 480 ml   Filed Weights   06/27/18 2325 06/28/18 0300 06/28/18 1427  Weight: (!) 171.1 kg (377 lb 3.3 oz) (!) 173.1 kg (381 lb 9.9 oz) (!) 169.3 kg (373 lb 3.8 oz)     Physical Examination:    General: Appears in no acute distress  Cardiovascular:  S1-S2, regular  Respiratory: Soft, nontender, no organomegalyClear to auscultation bilaterally  Abdomen: Soft, nontender, no organomegaly  Extremities: Bilateral chronic lymphedema lower extremities  Neurologic: Alert oriented x3     Data Reviewed: I have personally reviewed following labs and imaging studies  CBG: No results for input(s): GLUCAP in the last 168 hours.  CBC: Recent Labs  Lab 06/27/18 1355  WBC 10.6*  HGB 11.0*  HCT 34.1*  MCV 98.0  PLT 947    Basic Metabolic Panel: Recent Labs  Lab 06/27/18 1355 06/27/18 1543 06/28/18 0554  NA 140  --  141  K 2.4*  --  2.7*  CL 96*  --  99  CO2 30  --  30  GLUCOSE 178*  --  310*  BUN 34*  --  31*  CREATININE 7.65*  --  7.68*  CALCIUM 8.2*  --  7.9*  MG  --  1.7  --   PHOS  --   --  4.3    Recent Results (from the past 240 hour(s))  MRSA PCR Screening     Status: Abnormal   Collection Time: 06/27/18 10:50 PM  Result Value Ref Range Status   MRSA by PCR POSITIVE (A) NEGATIVE Final    Comment: RN Kennieth Francois [phone] _[phone]  THANEY Performed at [facility] [address] [place], [place] [address]      Liver Function Tests: Recent Labs  Lab 06/27/18 1355 06/28/18 0554  AST 14*  --   ALT 13  --   ALKPHOS 86  --   BILITOT 0.6  --   PROT 7.7  --   ALBUMIN 3.5 2.9*   No  results for input(s): LIPASE, AMYLASE in the last 168 hours. No results for input(s): AMMONIA in the last 168 hours.  Cardiac Enzymes: Recent Labs  Lab 06/27/18 1355  TROPONINI 0.08*   BNP (last 3 results) No results for input(s): BNP in the last 8760 hours.  ProBNP (last 3 results) No results for input(s): PROBNP in the last 8760 hours.    Studies: Dg Chest 2 View  Result Date: 06/27/2018 CLINICAL DATA:  Shortness of breath.  Fever for 1 week EXAM: CHEST - 2 VIEW COMPARISON:  08/30/2017 FINDINGS: The heart size and mediastinal contours are within normal limits. Both lungs are clear. The visualized skeletal structures are unremarkable. IMPRESSION: No active cardiopulmonary disease. Electronically Signed   By: Kathreen Devoid   On: 06/27/2018 13:51    Scheduled Meds: . cephALEXin  500 mg Oral Q12H  . Chlorhexidine Gluconate Cloth  6 each Topical Q0600  . gentamicin cream  1 application Topical Daily  . lanthanum  3,000 mg Oral TID WC  . multivitamin  1 tablet Oral QHS  . mupirocin ointment  1 application Nasal BID  . potassium chloride  40 mEq Oral BID  . sodium chloride flush  3 mL Intravenous Q12H      Time spent: 25 min  Oswald Hillock   [facility] Pager [phone]. If 7PM-7AM, please contact night-coverage at [email or web address], Office  [phone]  password [email or web address]  06/28/2018, 6:47 PM  LOS: 1 day

## 2018-06-28 NOTE — Progress Notes (Signed)
  [facility] Progress Note    Assessment/ Plan:   ESRD CCPD Befecadu on 5 exch CCPD with 2.5% and noted to be net positive each 24hr period for past week here with dyspnea.  1 ESRD  Not well controlled or managed PD.  Needs hypertonic soln, freq exchanges to get out of trouble, then better training to maintain.  Will make sure can UF.   - Fortunately net neg almost 8 lbs already since last night and she is responding to the hypertonic fluid. - Likely will need 3-4 days of hypertonic therapy; although we can also use the fistula to remove if in a bind (nice left Cimino) 2 DM need to monitor bs on PD with DM.  3 Hypertension: not much of issue 4. Anemia of ESRD: Hb acceptable 5. Metabolic Bone Disease: no values, check Phos, PTH 6 Massive obesity   7 Fibromyalgia  Seen on CCPD: currently 7th fill with already net neg 3.3 Liters. No change to regimen. Will continue current regimen of 24hr/7 exchanges via CCPD with 4.25%. She will need better education to prevent another hospitalization as she was continually net positive over the past week.   Subjective:   Dyspnea only modestly improved.   Objective:   BP (!) 134/58 (BP Location: Right Arm)   Pulse 84   Temp 98.1 F (36.7 C) (Oral)   Resp 18   Ht 5' 5" (1.651 m)   Wt (!) 173.1 kg (381 lb 9.9 oz)   SpO2 99%   BMI 63.50 kg/m   Intake/Output Summary (Last 24 hours) at 06/28/2018 1306 Last data filed at 06/28/2018 0845 Gross per 24 hour  Intake 410 ml  Output 0 ml  Net 410 ml   Weight change:   Physical Exam: Gen: pleasant, comfortable, coughing CV: RRR Pulm: distant BS but o/w clear GI: +BS, obese, soft, NDNT, PD cath on left side of abd Ext: Severe lymphedema L>R Neuro: grossly intact    Imaging: Dg Chest 2 View  Result Date: 06/27/2018 CLINICAL DATA:  Shortness of breath.  Fever for 1 week EXAM: CHEST - 2 VIEW COMPARISON:  08/30/2017 FINDINGS: The heart size and mediastinal contours are within  normal limits. Both lungs are clear. The visualized skeletal structures are unremarkable. IMPRESSION: No active cardiopulmonary disease. Electronically Signed   By: Kathreen Devoid   On: 06/27/2018 13:51    Labs: BMET Recent Labs  Lab 06/27/18 1355 06/28/18 0554  NA 140 141  K 2.4* 2.7*  CL 96* 99  CO2 30 30  GLUCOSE 178* 310*  BUN 34* 31*  CREATININE 7.65* 7.68*  CALCIUM 8.2* 7.9*  PHOS  --  4.3   CBC Recent Labs  Lab 06/27/18 1355  WBC 10.6*  HGB 11.0*  HCT 34.1*  MCV 98.0  PLT 264    Medications:    . cephALEXin  500 mg Oral Q12H  . Chlorhexidine Gluconate Cloth  6 each Topical Q0600  . gentamicin cream  1 application Topical Daily  . lanthanum  3,000 mg Oral TID WC  . multivitamin  1 tablet Oral QHS  . mupirocin ointment  1 application Nasal BID  . potassium chloride  40 mEq Oral BID  . sodium chloride flush  3 mL Intravenous Q12H      Otelia Santee, MD 06/28/2018, 1:06 PM

## 2018-06-28 NOTE — Progress Notes (Signed)
Paged Dr. Darrick Meigs regarding K+ of 2.7 this AM. Awaiting orders.

## 2018-06-28 NOTE — Progress Notes (Signed)
PD tx initiated via tenckhoff w/o problem, VSS, report given to Ginger, RN

## 2018-06-29 LAB — RENAL FUNCTION PANEL
ALBUMIN: 3 g/dL — AB (ref 3.5–5.0)
Anion gap: 10 (ref 5–15)
BUN: 30 mg/dL — AB (ref 6–20)
CO2: 32 mmol/L (ref 22–32)
CREATININE: 8.07 mg/dL — AB (ref 0.44–1.00)
Calcium: 8.2 mg/dL — ABNORMAL LOW (ref 8.9–10.3)
Chloride: 98 mmol/L (ref 98–111)
GFR, EST AFRICAN AMERICAN: 6 mL/min — AB (ref >60)
GFR, EST NON AFRICAN AMERICAN: 5 mL/min — AB (ref >60)
Glucose, Bld: 256 mg/dL — ABNORMAL HIGH (ref 70–99)
POTASSIUM: 3 mmol/L — AB (ref 3.5–5.1)
Phosphorus: 3.6 mg/dL (ref 2.5–4.6)
Sodium: 140 mmol/L (ref 135–145)

## 2018-06-29 LAB — GLUCOSE, CAPILLARY
Glucose-Capillary: 277 mg/dL — ABNORMAL HIGH (ref 70–99)
Glucose-Capillary: 211 mg/dL — ABNORMAL HIGH (ref 70–99)
Glucose-Capillary: 233 mg/dL — ABNORMAL HIGH (ref 70–99)

## 2018-06-29 LAB — HEPATITIS B SURFACE ANTIGEN: Hepatitis B Surface Ag: NEGATIVE

## 2018-06-29 LAB — TROPONIN I
TROPONIN I: 0.05 ng/mL — AB (ref <0.03)
TROPONIN I: 0.04 ng/mL — AB (ref <0.03)

## 2018-06-29 MED ORDER — POTASSIUM CHLORIDE CRYS ER 20 MEQ PO TBCR
40.0000 meq | EXTENDED_RELEASE_TABLET | Freq: Two times a day (BID) | ORAL | Status: AC
  Administered 2018-06-29 (×2): 40 meq via ORAL
  Filled 2018-06-29 (×2): qty 2

## 2018-06-29 MED ORDER — INSULIN ASPART 100 UNIT/ML ~~LOC~~ SOLN
0.0000 [IU] | Freq: Three times a day (TID) | SUBCUTANEOUS | Status: DC
  Administered 2018-06-29: 5 [IU] via SUBCUTANEOUS
  Administered 2018-06-30: 8 [IU] via SUBCUTANEOUS

## 2018-06-29 NOTE — Progress Notes (Signed)
Inpatient Diabetes Program Recommendations  AACE/ADA: New Consensus Statement on Inpatient Glycemic Control (2015)  Target Ranges:  Prepandial:   less than 140 mg/dL      Peak postprandial:   less than 180 mg/dL (1-2 hours)      Critically ill patients:  140 - 180 mg/dL   Lab Results  Component Value Date   GLUCAP 277 (H) 06/29/2018   HGBA1C 9.0 (H) 06/28/2018    Review of Glycemic Control  Diabetes history:  Outpatient Diabetes medications: none Current orders for Inpatient glycemic control: Novolog SENSITIVE TID  Inpatient Diabetes Program Recommendations:   Noted that blood sugars have continued to be greater than 180 mg/dl. Noted that patient does not take anything for diabetes at home. Recommend increasing Novolog correction scale to MODERATE TID & HS. Will continue to monitor blood sugars while in the hospital.   Harvel Ricks RN BSN CDE Diabetes Coordinator Pager: [phone]  8am-5pm

## 2018-06-29 NOTE — Consult Note (Signed)
[facility] CM Inpatient Consult   06/29/2018  [name] July 18, 1966 [id number]  Patient screened for potential [facility] services. Patient is in the [place] of the [facility] services under patient's Medicare plan.  Patient was discussed in progression meeting as having a disable husband at home and patient disabled as well.  Met with the patient at the bedside to discuss [facility] services.  Patient states she had recently started home peritoneal dialysis.  She states she was being training by a nurse from her dialysis center from [place]. She states that the PD was not pulling off enough fluid.  She states, "I felt like it would be easier on me and my family because I am wheelchair bound and my husband is also.  But I guess it [PD] was not taking off enough.  My sister was taking me to dialysis three times a week. My son lives with [place] but he works second shift. I just felt it was much easier."  Patient wanted to know if Medicare would pay for personal care services.  Patient states she does not have Medicaid.  She states, "I would love to get some help to find out if I qualify." Consent for signed and will have community staff for post hospital follow up.  Primary care provider is [name], MD and this office is listed to provide the transition of care follow up.      Patient will be followed by [facility] for disease and care management program. For questions contact:   Natividad Brood, RN BSN [facility] Liaison  [phone] business mobile phone Toll free office [phone]

## 2018-06-29 NOTE — Progress Notes (Signed)
Seen pt and PD still on fill 4 of 7 . No beeping noted, and it was noted the the wheel of cart sitting on top of the PD line.  Primary nurse made aware and informed if PD is completed to inform HD staff.

## 2018-06-29 NOTE — Progress Notes (Signed)
PD tx d/c @ 1828 via tenckhoff w/o problem, PD tx initiated @ 1858 via tenckhoff w/o problem, VSS, report given to Nilda Simmer, RN

## 2018-06-29 NOTE — Progress Notes (Signed)
Pt PD completed per machine  Called HD to inform  HD RN on way to bedside

## 2018-06-29 NOTE — Progress Notes (Signed)
Second call to HD RN for pt competition of PD

## 2018-06-29 NOTE — Progress Notes (Addendum)
  [facility] Progress Note    Assessment/ Plan:   ESRD CCPD Befecadu on 5 exch CCPD with 2.5% and noted to be net positive each 24hr period for past week here with dyspnea.  1ESRD Not well controlled or managed PD. Needs hypertonic soln, freq exchanges to get out of trouble, then better training to maintain. Will make sure can UF.  - Fortunately net neg almost 8 lbs already since last night and she is responding to the hypertonic fluid. - Likely will need 3-4 days of hypertonic therapy if all done in the hospital but I spoke with Stanton Kidney (home therapies coordinator w/ [facility] [phone] And she will have the 4.25% at the clinic for pt to pick up tomorrow so she can be d/c in the AM. She will order Icodextran for the pt. - d/c home with KDur 76mq daily as well; many of our pts will incr K in their diet and avoid the supplement   2DM need to monitor bs on PD with DM. 3 Hypertension:not much of issue 4. Anemia of ESRD:Hb acceptable 5. Metabolic Bone Disease:no values, check Phos, PTH 6 Massive obesity  7 Fibromyalgia  Seen on CCPD:  1) net neg 3.2 liters yesterday 2) Today as of 230PM she's already neg 1.6L 3) Tolerating and no changes.      Subjective:   Breathing more comfortable. Denies f/c/n/v/cp.   Objective:   BP (!) 108/59 (BP Location: Right Arm)   Pulse 98   Temp 98 F (36.7 C) (Oral)   Resp 20   Ht 5' 5" (1.651 m)   Wt (!) 173.1 kg (381 lb 9.6 oz)   SpO2 97%   BMI 63.50 kg/m   Intake/Output Summary (Last 24 hours) at 06/29/2018 1446 Last data filed at 06/28/2018 2153 Gross per 24 hour  Intake 123 ml  Output -  Net 123 ml   Weight change: 31.4 kg (69 lb 3.8 oz)  Physical Exam: Gen: pleasant, comfortable, coughing CV: RRR Pulm: distant BS but o/w clear GI: +BS, obese, soft, NDNT, PD cath on left side of abd Ext: Severe lymphedema L>R Neuro: grossly intact    Imaging: No results  found.  Labs: BMET Recent Labs  Lab 06/27/18 1355 06/28/18 0554 06/29/18 0113  NA 140 141 140  K 2.4* 2.7* 3.0*  CL 96* 99 98  CO2 30 30 32  GLUCOSE 178* 310* 256*  BUN 34* 31* 30*  CREATININE 7.65* 7.68* 8.07*  CALCIUM 8.2* 7.9* 8.2*  PHOS  --  4.3 3.6   CBC Recent Labs  Lab 06/27/18 1355  WBC 10.6*  HGB 11.0*  HCT 34.1*  MCV 98.0  PLT 264    Medications:    . cephALEXin  500 mg Oral Q12H  . Chlorhexidine Gluconate Cloth  6 each Topical Q0600  . gentamicin cream  1 application Topical Daily  . insulin aspart  0-15 Units Subcutaneous TID WC  . lanthanum  3,000 mg Oral TID WC  . multivitamin  1 tablet Oral QHS  . mupirocin ointment  1 application Nasal BID  . potassium chloride  40 mEq Oral BID  . sodium chloride flush  3 mL Intravenous Q12H      JOtelia Santee MD 06/29/2018, 2:46 PM

## 2018-06-29 NOTE — Progress Notes (Signed)
[facility]  PROGRESS NOTE  [name] VWU:[id number] DOB: 1966/07/10 DOA: 06/27/2018 PCP: [name], MD   Brief HPI:   52 year old female with a history of ESRD on dialysis switched over to peritoneal dialysis at home about 2 weeks ago, chronic cor pulmonale, asthma since childhood, diabetes mellitus came to hospital with worsening shortness of breath.  Patient stated that her initial drive it was 295 EKG which went up to 174 kg and now is down to 168 kg.  All this happened after she was transitioned to peritoneal dialysis.    Subjective   Patient seen and examined, still complains of shortness of breath.   Assessment/Plan:     1. Dyspnea-chest x-ray negative for pneumonia, likely from volume overload.  Patient is currently on peritoneal dialysis.  Nephrology following. 2. Acute bronchitis-patient complaining of yellow phlegm few days ago along with right ear pain,  empirically started on Keflex. 3. Hypokalemia-potassium is  still 3.0,, will replace potassium.  Follow BMP in am. 4. Diabetes mellitus-appears in no acute distress blood glucose is elevated, will change sliding scale to moderate. 5. Elevated troponin-patient had mild elevation of troponin, no chest pain, no EKG changes.  Will cycle troponin every 6 hours.    DVT prophylaxis: SCDs  Code Status: Full code  Family Communication: No family at bedside  Disposition Plan: likely home when medically ready for discharge   Consultants: Nephrology Procedures:  Peritoneal dialysis   Antibiotics:   Anti-infectives (From admission, onward)   Start     Dose/Rate Route Frequency Ordered Stop   06/28/18 1000  cephALEXin (KEFLEX) capsule 500 mg     500 mg Oral Every 12 hours 06/28/18 0928         Objective   Vitals:   06/28/18 2010 06/29/18 0024 06/29/18 0613 06/29/18 1230  BP: 133/67 92/79 123/82 (!) 108/59  Pulse: 88 80 88 98  Resp: _0 Temp: 98.1 F (36.7 C) 98 F (36.7 C) 97.6 F  (36.4 C) 98 F (36.7 C)  TempSrc: Oral Oral Oral Oral  SpO2: 100% 97% 96% 97%  Weight:   (!) 173.1 kg (381 lb 9.6 oz)   Height:        Intake/Output Summary (Last 24 hours) at 06/29/2018 1353 Last data filed at 06/28/2018 2153 Gross per 24 hour  Intake 123 ml  Output -  Net 123 ml   Filed Weights   06/28/18 1427 06/28/18 1827 06/29/18 0613  Weight: (!) 169.3 kg (373 lb 3.8 oz) (!) 170.8 kg (376 lb 8.7 oz) (!) 173.1 kg (381 lb 9.6 oz)     Physical Examination:    General: Appears in no acute distress  Cardiovascular: S1-S2, regular  Respiratory: Clear to auscultation bilaterally   Abdomen: Soft, nontender, no organomegaly  Extremities: Chronic lymphedema bilaterally in the lower extremities  Neurologic: Alert, oriented x3     Data Reviewed: I have personally reviewed following labs and imaging studies  CBG: Recent Labs  Lab 06/28/18 2159 06/29/18 0802  GLUCAP 265* 277*    CBC: Recent Labs  Lab 06/27/18 1355  WBC 10.6*  HGB 11.0*  HCT 34.1*  MCV 98.0  PLT 621    Basic Metabolic Panel: Recent Labs  Lab 06/27/18 1355 06/27/18 1543 06/28/18 0554 06/29/18 0113  NA 140  --  141 140  K 2.4*  --  2.7* 3.0*  CL 96*  --  99 98  CO2 30  --  30 32  GLUCOSE 178*  --  310* 256*  BUN 34*  --  31* 30*  CREATININE 7.65*  --  7.68* 8.07*  CALCIUM 8.2*  --  7.9* 8.2*  MG  --  1.7  --   --   PHOS  --   --  4.3 3.6    Recent Results (from the past 240 hour(s))  MRSA PCR Screening     Status: Abnormal   Collection Time: 06/27/18 10:50 PM  Result Value Ref Range Status   MRSA by PCR POSITIVE (A) NEGATIVE Final    Comment: RN Kennieth Francois [phone] _[phone]  THANEY Performed at [facility] [address] [place], [place] [address]      Liver Function Tests: Recent Labs  Lab 06/27/18 1355 06/28/18 0554 06/29/18 0113  AST 14*  --   --   ALT 13  --   --   ALKPHOS 86  --   --   BILITOT 0.6  --   --   PROT 7.7  --   --   ALBUMIN 3.5 2.9* 3.0*    No results for input(s): LIPASE, AMYLASE in the last 168 hours. No results for input(s): AMMONIA in the last 168 hours.  Cardiac Enzymes: Recent Labs  Lab 06/27/18 1355 06/29/18 0113 06/29/18 0717  TROPONINI 0.08* 0.05* 0.04*   BNP (last 3 results) No results for input(s): BNP in the last 8760 hours.  ProBNP (last 3 results) No results for input(s): PROBNP in the last 8760 hours.    Studies: No results found.  Scheduled Meds: . cephALEXin  500 mg Oral Q12H  . Chlorhexidine Gluconate Cloth  6 each Topical Q0600  . gentamicin cream  1 application Topical Daily  . insulin aspart  0-15 Units Subcutaneous TID WC  . lanthanum  3,000 mg Oral TID WC  . multivitamin  1 tablet Oral QHS  . mupirocin ointment  1 application Nasal BID  . sodium chloride flush  3 mL Intravenous Q12H      Time spent: 25 min  Oswald Hillock   [facility] Pager [phone]. If 7PM-7AM, please contact night-coverage at [email or web address], Office  [phone]  password [email or web address]  06/29/2018, 1:53 PM  LOS: 2 days

## 2018-06-30 DIAGNOSIS — R748 Abnormal levels of other serum enzymes: Secondary | ICD-10-CM

## 2018-06-30 LAB — RENAL FUNCTION PANEL
Albumin: 3 g/dL — ABNORMAL LOW (ref 3.5–5.0)
Anion gap: 13 (ref 5–15)
BUN: 28 mg/dL — AB (ref 6–20)
CHLORIDE: 97 mmol/L — AB (ref 98–111)
CO2: 30 mmol/L (ref 22–32)
CREATININE: 7.77 mg/dL — AB (ref 0.44–1.00)
Calcium: 8.4 mg/dL — ABNORMAL LOW (ref 8.9–10.3)
GFR calc Af Amer: 6 mL/min — ABNORMAL LOW (ref >60)
GFR calc non Af Amer: 5 mL/min — ABNORMAL LOW (ref >60)
GLUCOSE: 304 mg/dL — AB (ref 70–99)
POTASSIUM: 3.2 mmol/L — AB (ref 3.5–5.1)
Phosphorus: 3.9 mg/dL (ref 2.5–4.6)
Sodium: 140 mmol/L (ref 135–145)

## 2018-06-30 LAB — GLUCOSE, CAPILLARY
Glucose-Capillary: 217 mg/dL — ABNORMAL HIGH (ref 70–99)
Glucose-Capillary: 269 mg/dL — ABNORMAL HIGH (ref 70–99)
Glucose-Capillary: 218 mg/dL — ABNORMAL HIGH (ref 70–99)

## 2018-06-30 LAB — HEPATITIS B SURFACE ANTIBODY,QUALITATIVE: HEP B S AB: NONREACTIVE

## 2018-06-30 LAB — HEPATITIS B SURFACE ANTIGEN: HEP B S AG: NEGATIVE

## 2018-06-30 MED ORDER — BLOOD GLUCOSE MONITOR KIT: PACK | 0 refills | Status: DC

## 2018-06-30 MED ORDER — POTASSIUM CHLORIDE ER 20 MEQ PO TBCR: 40.0000 meq | EXTENDED_RELEASE_TABLET | Freq: Every day | ORAL | 2 refills | Status: DC

## 2018-06-30 MED ORDER — INSULIN GLARGINE 100 UNIT/ML ~~LOC~~ SOLN: 10.0000 [IU] | Freq: Every day | SUBCUTANEOUS | 3 refills | Status: DC

## 2018-06-30 NOTE — Progress Notes (Signed)
Pt IV removed and telemetry by nursing student. Pt has all belongings incuding home oxygen tank. Discharge education went over at bedside. Awaiting pt transportation

## 2018-06-30 NOTE — Progress Notes (Signed)
  [facility] Progress Note    Assessment/ Plan:   ESRD CCPD [place] on 5 exch CCPD with 2.5% and noted to be net positive each 24hr period for past week here with dyspnea.  1ESRD Not well controlled or managed PD. Needs hypertonic soln, freq exchanges to get out of trouble, then better training to maintain. Will make sure can UF. - Fortunately net neg -5883 (down 11 lbs) - Will need hypertonic therapy at home; notified  Stanton Kidney (home therapies coordinator w/ [facility] [phone] Son has already picked up the 4.25% at the clinic and Stanton Kidney has ordered the [place].   2DM need to monitor bs on PD with DM. 3 Hypertension:not much of issue 4. Anemia of ESRD:Hb acceptable 5. Metabolic Bone Disease:no values, check Phos, PTH 6 Massive obesity  7 Fibromyalgia    Subjective:   Breathing more comfortable. Denies f/c/n/v/cp.   Objective:   BP 130/70 (BP Location: Right Arm)   Pulse 83   Temp 98.2 F (36.8 C) (Oral)   Resp 18   Ht _0  (1.651 m)   Wt (!) 166 kg (366 lb)   SpO2 100%   BMI 60.91 kg/m   Intake/Output Summary (Last 24 hours) at 06/30/2018 1303 Last data filed at 06/30/2018 1015 Gross per 24 hour  Intake 30578 ml  Output 36811 ml  Net -6233 ml   Weight change: 1.6 kg (3 lb 8.4 oz)  Physical Exam: Gen: pleasant, comfortable, coughing CV: RRR Pulm: distant BS but o/w clear GI: +BS, obese, soft, NDNT, PD cath on left side of abd Ext: Severe lymphedema L>R Neuro: grossly intact    Imaging: No results found.  Labs: BMET Recent Labs  Lab 06/27/18 1355 06/28/18 0554 06/29/18 0113 06/30/18 0309  NA 140 141 140 140  K 2.4* 2.7* 3.0* 3.2*  CL 96* 99 98 97*  CO2 30 30 32 30  GLUCOSE 178* 310* 256* 304*  BUN 34* 31* 30* 28*  CREATININE 7.65* 7.68* 8.07* 7.77*  CALCIUM 8.2* 7.9* 8.2* 8.4*  PHOS  --  4.3 3.6 3.9   CBC Recent Labs  Lab 06/27/18 1355  WBC 10.6*  HGB 11.0*  HCT 34.1*  MCV 98.0  PLT 264     Medications:    . cephALEXin  500 mg Oral Q12H  . Chlorhexidine Gluconate Cloth  6 each Topical Q0600  . gentamicin cream  1 application Topical Daily  . insulin aspart  0-15 Units Subcutaneous TID WC  . lanthanum  3,000 mg Oral TID WC  . multivitamin  1 tablet Oral QHS  . mupirocin ointment  1 application Nasal BID  . sodium chloride flush  3 mL Intravenous Q12H      Otelia Santee, MD 06/30/2018, 1:03 PM

## 2018-06-30 NOTE — Care Management Note (Signed)
Case Management Note  Patient Details  Name: [name] MRN: [id number] Date of Birth: April 16, 1966  Subjective/Objective:   Shortness of Breath                Action/Plan: PCP: [name], MD; has private insurance with Medicare; patient has home oxygen with [place] but has not used it in a while; Jermane with [facility] called, a home health Resp Therapist will go to the home to make sure that the oxygen is functioning properly; [facility] choice offered, pt chose [place]; referral made as requested.  Expected Discharge Date:  06/30/18               Expected Discharge Plan:  [place]  Discharge planning Services  CM Consult Choice offered to:  Patient  HH Arranged:  RN, Respirator Therapy [place] Agency:  [place]  Status of Service:  In process, will continue to follow  Sherrilyn Rist [phone] 06/30/2018, 11:35 AM

## 2018-06-30 NOTE — Progress Notes (Signed)
Results for MADDALYNN, BARNARD (MRN [id number]) as of 06/30/2018 13:00  Ref. Range 06/29/2018 12:21 06/29/2018 16:26 06/29/2018 21:46 06/30/2018 08:12 06/30/2018 11:54  Glucose-Capillary Latest Ref Range: 70 - 99 mg/dL 217 (H) 211 (H) 233 (H) 269 (H) 218 (H)  Noted that blood sugars continue to be greater than 180 mg/dl. Continue Novolog correction scale MODERATE TID as ordered. May want to consider adding Novolog 4 units TID with meals if patient eats at least 50 % of meal and postprandial blood sugars continue to be elevated.  Harvel Ricks RN BSN CDE Diabetes Coordinator Pager: [phone]  8am-5pm

## 2018-06-30 NOTE — Discharge Summary (Signed)
Physician Discharge Summary  [name] EYH:[id number] DOB: 1966/03/07 DOA: 06/27/2018  PCP: [name], MD  Admit date: 06/27/2018 Discharge date: 06/30/2018  Time spent: 35* minutes  Recommendations for Outpatient Follow-up:  1. *Follow up nephrology in 2 weeks   Discharge Diagnoses:  Principal Problem:   SOB (shortness of breath) Active Problems:   Diabetes mellitus (HCC)   Morbid obesity (HCC)   Obesity hypoventilation syndrome (HCC)   Chronic diastolic CHF (congestive heart failure) (HCC)   ESRD on dialysis (HCC)   Anemia of chronic disease   Elevated troponin   Cor pulmonale, chronic (HCC)   Hypokalemia   Discharge Condition: Stable  Diet recommendation: Renal diet  Filed Weights   06/29/18 1827 06/29/18 1828 06/30/18 0615  Weight: (!) 170.9 kg (376 lb 12.3 oz) (!) 170.9 kg (376 lb 12.3 oz) (!) 166 kg (366 lb)    History of present illness:  52 year old female with a history of ESRD on dialysis switched over to peritoneal dialysis at home about 2 weeks ago, chronic cor pulmonale, asthma since childhood, diabetes mellitus came to hospital with worsening shortness of breath.  Patient stated that her initial drive it was 159 EKG which went up to 174 kg and now is down to 168 kg.  All this happened after she was transitioned to peritoneal dialysis.  Hospital Course:  1. Dyspnea-chest x-ray negative for pneumonia, likely from volume overload.  Patient is currently on peritoneal dialysis.  Nephrology saw the patient and started hypotonic solution, frequent exchanges.  Patient is -8 pounds since admission.  She will be needing 3 to 4 days of hypertonic therapy and follow-up with home therapies coordinated with [facility]. 2. Acute bronchitis-patient complaining of yellow phlegm few days ago along with right ear pain,  empirically started on Keflex no improvement.,  Patient's phlegm and shortness of breath from fluid overload.  Will discontinue Keflex at this  time. 3. Hypokalemia-potassium is  still 3.2,, will discharge patient on potassium supplementation as per nephrology recommendation, K. Dur 40 meq p.o. daily 4. Diabetes mellitus-hemoglobin A1c is 9.0, patient does not take any medication for diabetes at home.  Will start Lantus 10 units subcu daily.  She will check her CBG daily and follow-up with PCP in 2 weeks. 5. Elevated troponin-patient had mild elevation of troponin, no chest pain, no EKG changes.    Repeat troponin has come down 2.04.  Likely from demand ischemia from fluid overload.     Procedures: Peritoneal dialysis  Consultations:  Nephrology  Discharge Exam: Vitals:   06/30/18 0750 06/30/18 0823  BP: 140/68 (!) 128/55  Pulse: 85 71  Resp: 19 18  Temp: 98.4 F (36.9 C) 98.3 F (36.8 C)  SpO2: 99% 99%    General: Appears in no acute distress Cardiovascular: S1-S2 regular Respiratory: Clear to auscultation bilaterally  Discharge Instructions   Discharge Instructions    Diet - low sodium heart healthy   Complete by:  As directed    Increase activity slowly   Complete by:  As directed      Allergies as of 06/30/2018      Reactions   Contrast Media [iodinated Diagnostic Agents] Anaphylaxis, Hives, Swelling, Other (See Comments)   Dye for cardiac cath. Tongue swells   Pneumococcal Vaccines Swelling, Other (See Comments)   Turns skin black, and bodily swelling   Vancomycin Nausea And Vomiting, Other (See Comments)   Infusion "made me feel like I was dying" had to be readmitted to hospital  Medication List    TAKE these medications   acetaminophen 325 MG tablet Commonly known as:  TYLENOL Take 650 mg by mouth every 6 (six) hours as needed for moderate pain or fever.   albuterol (2.5 MG/3ML) 0.083% nebulizer solution Commonly known as:  PROVENTIL Take 2.5 mg by nebulization every 6 (six) hours as needed for wheezing or shortness of breath.   ALPRAZolam 0.5 MG tablet Commonly known as:  XANAX Take  0.5 mg by mouth 3 (three) times daily as needed for anxiety or sleep.   amLODipine 10 MG tablet Commonly known as:  NORVASC Take 0.5 tablets (5 mg total) by mouth daily.   blood glucose meter kit and supplies Kit Dispense based on patient and insurance preference. Use up to four times daily as directed. (FOR ICD-9 250.00, 250.01).   diphenhydrAMINE 25 mg capsule Commonly known as:  BENADRYL Take 25 mg by mouth every 6 (six) hours as needed for allergies.   insulin glargine 100 UNIT/ML injection Commonly known as:  LANTUS Inject 0.1 mLs (10 Units total) into the skin at bedtime.   lanthanum 500 MG chewable tablet Commonly known as:  FOSRENOL Chew 1,000-1,500 mg by mouth 5 (five) times daily. Patient takes 3 tablets by mouth three times a day with meals and 2 tablets by mouth twice a day with snacks   multivitamin Tabs tablet Take 1 tablet by mouth daily.   ondansetron 4 MG tablet Commonly known as:  ZOFRAN Take 1 tablet (4 mg total) by mouth every 6 (six) hours as needed for nausea.   Oxycodone HCl 10 MG Tabs Take 1 tablet (10 mg total) by mouth 4 (four) times daily as needed (pain).   Potassium Chloride ER 20 MEQ Tbcr Take 40 mEq by mouth daily.            Durable Medical Equipment  (From admission, onward)        Start     Ordered   06/30/18 1006  For home use only DME oxygen  Once    Question Answer Comment  Mode or (Route) Nasal cannula   Liters per Minute 2   Frequency Continuous (stationary and portable oxygen unit needed)   Oxygen conserving device Yes   Oxygen delivery system Gas      06/30/18 1005     Allergies  Allergen Reactions  . Contrast Media [Iodinated Diagnostic Agents] Anaphylaxis, Hives, Swelling and Other (See Comments)    Dye for cardiac cath. Tongue swells  . Pneumococcal Vaccines Swelling and Other (See Comments)    Turns skin black, and bodily swelling  . Vancomycin Nausea And Vomiting and Other (See Comments)    Infusion "made me  feel like I was dying" had to be readmitted to hospital   Follow-up Information    Dwana Melena, MD Follow up in 2 week(s).   Specialty:  Nephrology Contact information: [place] [id number] [phone]            The results of significant diagnostics from this hospitalization (including imaging, microbiology, ancillary and laboratory) are listed below for reference.    Significant Diagnostic Studies: Dg Chest 2 View  Result Date: 06/27/2018 CLINICAL DATA:  Shortness of breath.  Fever for 1 week EXAM: CHEST - 2 VIEW COMPARISON:  08/30/2017 FINDINGS: The heart size and mediastinal contours are within normal limits. Both lungs are clear. The visualized skeletal structures are unremarkable. IMPRESSION: No active cardiopulmonary disease. Electronically Signed   By: Kathreen Devoid  On: 06/27/2018 13:51    Microbiology: Recent Results (from the past 240 hour(s))  MRSA PCR Screening     Status: Abnormal   Collection Time: 06/27/18 10:50 PM  Result Value Ref Range Status   MRSA by PCR POSITIVE (A) NEGATIVE Final    Comment: RN Kennieth Francois [phone]  THANEY Performed at [facility], [address], [place], [place] [address]      Labs: Basic Metabolic Panel: Recent Labs  Lab 06/27/18 1355 06/27/18 1543 06/28/18 0554 06/29/18 0113 06/30/18 0309  NA 140  --  141 140 140  K 2.4*  --  2.7* 3.0* 3.2*  CL 96*  --  99 98 97*  CO2 30  --  30 32 30  GLUCOSE 178*  --  310* 256* 304*  BUN 34*  --  31* 30* 28*  CREATININE 7.65*  --  7.68* 8.07* 7.77*  CALCIUM 8.2*  --  7.9* 8.2* 8.4*  MG  --  1.7  --   --   --   PHOS  --   --  4.3 3.6 3.9   Liver Function Tests: Recent Labs  Lab 06/27/18 1355 06/28/18 0554 06/29/18 0113 06/30/18 0309  AST 14*  --   --   --   ALT 13  --   --   --   ALKPHOS 86  --   --   --   BILITOT 0.6  --   --   --   PROT 7.7  --   --   --   ALBUMIN 3.5 2.9* 3.0* 3.0*   No results for input(s): LIPASE, AMYLASE in the last 168  hours. No results for input(s): AMMONIA in the last 168 hours. CBC: Recent Labs  Lab 06/27/18 1355  WBC 10.6*  HGB 11.0*  HCT 34.1*  MCV 98.0  PLT 264   Cardiac Enzymes: Recent Labs  Lab 06/27/18 1355 06/29/18 0113 06/29/18 0717  TROPONINI 0.08* 0.05* 0.04*   BNP:  CBG: Recent Labs  Lab 06/29/18 0802 06/29/18 1221 06/29/18 1626 06/29/18 2146 06/30/18 0812  GLUCAP 277* 217* 211* 233* 269*       Signed:  Oswald Hillock MD.  [facility] 06/30/2018, 10:50 AM

## 2018-06-30 NOTE — Progress Notes (Signed)
Pt discharged via wheelchair with nurse tech

## 2018-07-03 ENCOUNTER — Other Ambulatory Visit: Payer: Self-pay | Admitting: *Deleted

## 2018-07-03 ENCOUNTER — Other Ambulatory Visit: Payer: Self-pay

## 2018-07-03 DIAGNOSIS — G8929 Other chronic pain: Secondary | ICD-10-CM | POA: Diagnosis not present

## 2018-07-03 DIAGNOSIS — I5032 Chronic diastolic (congestive) heart failure: Secondary | ICD-10-CM | POA: Diagnosis not present

## 2018-07-03 DIAGNOSIS — J45909 Unspecified asthma, uncomplicated: Secondary | ICD-10-CM | POA: Diagnosis not present

## 2018-07-03 DIAGNOSIS — M797 Fibromyalgia: Secondary | ICD-10-CM | POA: Diagnosis not present

## 2018-07-03 DIAGNOSIS — Z992 Dependence on renal dialysis: Secondary | ICD-10-CM | POA: Diagnosis not present

## 2018-07-03 DIAGNOSIS — I132 Hypertensive heart and chronic kidney disease with heart failure and with stage 5 chronic kidney disease, or end stage renal disease: Secondary | ICD-10-CM | POA: Diagnosis not present

## 2018-07-03 DIAGNOSIS — D631 Anemia in chronic kidney disease: Secondary | ICD-10-CM | POA: Diagnosis not present

## 2018-07-03 DIAGNOSIS — Z79891 Long term (current) use of opiate analgesic: Secondary | ICD-10-CM | POA: Diagnosis not present

## 2018-07-03 DIAGNOSIS — I2781 Cor pulmonale (chronic): Secondary | ICD-10-CM | POA: Diagnosis not present

## 2018-07-03 DIAGNOSIS — N186 End stage renal disease: Secondary | ICD-10-CM

## 2018-07-03 DIAGNOSIS — E1122 Type 2 diabetes mellitus with diabetic chronic kidney disease: Secondary | ICD-10-CM | POA: Diagnosis not present

## 2018-07-03 DIAGNOSIS — Z794 Long term (current) use of insulin: Secondary | ICD-10-CM | POA: Diagnosis not present

## 2018-07-03 DIAGNOSIS — I890 Lymphedema, not elsewhere classified: Secondary | ICD-10-CM | POA: Diagnosis not present

## 2018-07-03 DIAGNOSIS — E662 Morbid (severe) obesity with alveolar hypoventilation: Secondary | ICD-10-CM | POA: Diagnosis not present

## 2018-07-03 DIAGNOSIS — Q0700 Arnold-Chiari syndrome without spina bifida or hydrocephalus: Secondary | ICD-10-CM | POA: Diagnosis not present

## 2018-07-03 DIAGNOSIS — Z6841 Body Mass Index (BMI) 40.0 and over, adult: Secondary | ICD-10-CM | POA: Diagnosis not present

## 2018-07-03 DIAGNOSIS — Z9981 Dependence on supplemental oxygen: Secondary | ICD-10-CM | POA: Diagnosis not present

## 2018-07-03 DIAGNOSIS — I272 Pulmonary hypertension, unspecified: Secondary | ICD-10-CM | POA: Diagnosis not present

## 2018-07-03 NOTE — Patient Outreach (Signed)
Referral received 07/03/18 from hospital liason, pt hospitalized 7/9-7/12/19 for dyspnea, pt has history CHF, DM (new on lantus), obesity, anemia, ESRD recently on peritoneal dialysis, spoke with pt for transition of care week, 1, HIPAA verified, pt reports she is calling primary care MD and emailing his nurse to set up post hospital appointment and will need prescription for albuterol written (hers is expired and last dose taken yesterday, pt reports she is able to call MD independently, pt weighing daily, home health is active with pt, on oxygen 2 L continuously, pt continues with peritoneal dialysis and states " it's going better now" ,pt agreeable to [facility] pharmacist assisting her for issues with CBG meter and lantus.  RN CM placed order for pharmacy involvement, due to being late in the day, RN CM let pt know would most likely be tomorrow before pharmacy outreach.   Pt agreeable to home visit.  THN CM Care Plan Problem One     Most Recent Value  Care Plan Problem One  Pt high risk for readmission related to multiple co-morbidities (CHF, DM, ESRD, anemia, obesity)  Role Documenting the Problem One  Care Management Coordinator  Care Plan for Problem One  Active  THN Long Term Goal   pt will verbalize/ demonstrate improved self care related to multiple co-morbidites (HF, DM, ESRD with peritoneal dialysis at home) within 60 days  THN Long Term Goal Start Date  07/03/18  Interventions for Problem One Long Term Goal  RN CM reviewed discharge instructions, medications, upcoming appointments, gave 24 hour nurse line number, pt does not have lantus- issue with insurance, albuterol is expired (pt calling primary MD for new prescription), pt does not have CBG meter - per [place] pt needs to call insurance and ask which meter they cover- pt does have prescription,  RN CM placed order for [facility] pharmacist  [facility] CM Short Term Goal #1   pt will verbalize CHF action plan/ zones within 30 days  THN CM Short Term  Goal #1 Start Date  07/03/18  Interventions for Short Term Goal #1  RN CM reviewed HF action plan with pt, pt is weighing daily,  weight today 168 kg     PLAN See pt for initial home visit next week Continue weekly transition of care  [name] [facility] [place] Coordinator [phone]

## 2018-07-03 NOTE — Addendum Note (Signed)
Addended by: [name] A on: 07/03/2018 06:43 PM   Modules accepted: Orders

## 2018-07-04 ENCOUNTER — Other Ambulatory Visit: Payer: Self-pay | Admitting: Pharmacist

## 2018-07-04 ENCOUNTER — Telehealth: Payer: Self-pay | Admitting: Pharmacist

## 2018-07-04 NOTE — Patient Outreach (Signed)
[facility]) Care Management  07/04/2018  [name] Aug 27, 1966 [id number]  [facility] referral to pharmacy regarding medication assistance.  PMH significant for, but not limited to: ESRD HD (PD), CHF, DM (new start lantus 06/2018), obesity, anemia.  Successful call to [name] regarding medication assistance.  HIPAA identifiers verified.  Reviewed medications & allergies with the patient.  She states she is worried about not getting her new medication that she really needs.  She stated she a new start insulin patient that was recently discharged from the hospital on 06/30/18.  Of note, she is not currently taking blood pressure medication (amlodipine) because she is out of refilss.  She states her blood pressure was high yesterday 160/94.   Successful call to [name].  Spoke with Education administrator.  States nothing on file for blood glucose meter/supplies.  Lantus RX (written by Dr. Darrick Meigs) is still on profile with [facility] because insurance prefers the following: Levemir, [place] or [employer], agricultural.  Successful call to Dr. [name] office which is patient's PCP. RN [name] is calling in RXs for Levemir, albuterol, amlodipine to [facility] in [place].     Patient was recently discharged from hospital and all medications have been reviewed Medications Reviewed Today    Reviewed by [name], [facility] (Pharmacist) on 07/04/18 at [place] List Status: <None>  Medication Order Taking? Sig Documenting Provider Last Dose Status Informant  acetaminophen (TYLENOL) 325 MG tablet [id number]  Take 650 mg by mouth every 6 (six) hours as needed for moderate pain or fever.  [provider]  Active Self  albuterol (PROVENTIL) (2.5 MG/3ML) 0.083% nebulizer solution [id number] Yes Take 2.5 mg by nebulization every 6 (six) hours as needed for wheezing or shortness of breath. [provider] Taking Active Self           Med Note Modena Nunnery, Ivar Drape   Fri Aug 26, 2017  4:19  PM)    ALPRAZolam Duanne Moron) 0.5 MG tablet [id number] Yes Take 0.5 mg by mouth 3 (three) times daily as needed for anxiety or sleep.  [provider] Taking Active Self  amLODipine (NORVASC) 10 MG tablet [id number] No Take 0.5 tablets (5 mg total) by mouth daily.  Patient not taking:  Reported on 07/03/2018   [name], MD Not Taking Active Self  blood glucose meter kit and supplies KIT [id number] Yes Dispense based on patient and insurance preference. Use up to four times daily as directed. (FOR ICD-9 250.00, 250.01). Oswald Hillock, MD Taking Active   diphenhydrAMINE (BENADRYL) 25 mg capsule [id number] Yes Take 25 mg by mouth every 6 (six) hours as needed for allergies. [provider] Taking Active Self  insulin glargine (LANTUS) 100 UNIT/ML injection [id number]  Inject 0.1 mLs (10 Units total) into the skin at bedtime.  Patient not taking:  Reported on 07/03/2018   Oswald Hillock, MD  Active         Discontinued 03/08/12 1020 (Change in therapy)   lanthanum (FOSRENOL) 500 MG chewable tablet [id number] Yes Chew 1,000-1,500 mg by mouth 5 (five) times daily. Patient takes 3 tablets by mouth three times a day with meals and 2 tablets by mouth twice a day with snacks [provider] Taking Active Self  multivitamin (RENA-VIT) TABS tablet [id number] Yes Take 1 tablet by mouth daily. [provider] Taking Active Self  ondansetron (ZOFRAN) 4 MG tablet [id number] No Take 1 tablet (4 mg total) by mouth every 6 (six) hours as needed for  nausea.  Patient not taking:  Reported on 07/03/2018   [name], MD Not Taking Active Self  Oxycodone HCl 10 MG TABS [id number] Yes Take 1 tablet (10 mg total) by mouth 4 (four) times daily as needed (pain). [name], MD Taking Active Self  potassium chloride 20 MEQ TBCR [id number] Yes Take 40 mEq by mouth daily. Oswald Hillock, MD Taking Active          ASSESSMENT  Drugs sorted by system:  Neurologic/Psychologic:  alprazolam  Cardiovascular: norvasc  Pulmonary/Allergy: albuterol   Endocrine: Levemir  Renal: lanthanum, rena-vit  Pain: oxycodone, APAP PRN  Miscellaneous: benadryl PRN  ASSESSMENT: Date Discharged from Hospital: 06/30/18 Date Medication Reconciliation Performed: 07/04/2018  New Medications at Discharge:  Lantus -->will be switched to Levemir per insurance coverage   PLAN: -Instructed patient to take new medications as prescribed once she obtains medications -Patient instructed to call me with questions or concerns -Will f/u with patient early tomorrow after she obtains new insulin/supplies    Regina Eck, PharmD, [place]  [phone]

## 2018-07-04 NOTE — Patient Outreach (Signed)
07/04/2018  [name] Aug 18, 1966 [id number]  [name] referral to pharmacy regarding medication assistance.  PMH significant for, but not limited to: ESRD HD (PD), CHF, DM (new start lantus 06/2018), obesity, anemia.  Successful call to [name] regarding medication assistance.  HIPAA identifiers verified.  Reviewed medications & allergies with the patient.  She states she is worried about not getting her new medication that she really needs.  She stated she a new start insulin patient that was recently discharged from the hospital on 06/30/18.  Of note, she is not currently taking blood pressure medication (amlodipine) because she is out of refills.  She states her blood pressure was high yesterday 160/94.   Successful call to [place].  Spoke with Education administrator.  States nothing on file for blood glucose meter/supplies.  Lantus RX (written by Dr. Darrick Meigs) is still on profile with [facility] because insurance prefers the following: [place], [place] or [place], agricultural.  Successful call to Dr. [name] office which is patient's PCP. RN Caryl Asp is calling in RXs for Levemir, albuterol, amlodipine to [facility] in [place].     Patient was recently discharged from hospital and all medications have been reviewed    Medications Reviewed Today                Reviewed by Lavera Guise, [facility] (Pharmacist) on 07/04/18 at [place] List Status: <None>             Medication Order Taking? Sig Documenting Provider Last Dose Status Informant   acetaminophen (TYLENOL) 325 MG tablet [id number]  Take 650 mg by mouth every 6 (six) hours as needed for moderate pain or fever.  [provider]  Active Self   albuterol (PROVENTIL) (2.5 MG/3ML) 0.083% nebulizer solution [id number] Yes Take 2.5 mg by nebulization every 6 (six) hours as needed for wheezing or shortness of breath. [provider] Taking Active Self             Med Note Modena Nunnery, Ivar Drape   Fri Aug 26, 2017   4:19 PM)     ALPRAZolam Duanne Moron) 0.5 MG tablet [id number] Yes Take 0.5 mg by mouth 3 (three) times daily as needed for anxiety or sleep.  [provider] Taking Active Self   amLODipine (NORVASC) 10 MG tablet [id number] No Take 0.5 tablets (5 mg total) by mouth daily.  Patient not taking:  Reported on 07/03/2018   [name], MD Not Taking Active Self   blood glucose meter kit and supplies KIT [id number] Yes Dispense based on patient and insurance preference. Use up to four times daily as directed. (FOR ICD-9 250.00, 250.01). Oswald Hillock, MD Taking Active    diphenhydrAMINE (BENADRYL) 25 mg capsule [id number] Yes Take 25 mg by mouth every 6 (six) hours as needed for allergies. [provider] Taking Active Self   insulin glargine (LANTUS) 100 UNIT/ML injection [id number]  Inject 0.1 mLs (10 Units total) into the skin at bedtime.  Patient not taking:  Reported on 07/03/2018   Oswald Hillock, MD  Active          Discontinued 03/08/12 1020 (Change in therapy)    lanthanum (FOSRENOL) 500 MG chewable tablet [id number] Yes Chew 1,000-1,500 mg by mouth 5 (five) times daily. Patient takes 3 tablets by mouth three times a day with meals and 2 tablets by mouth twice a day with snacks [provider] Taking Active Self   multivitamin (RENA-VIT) TABS tablet [id number] Yes Take 1 tablet by  mouth daily. [provider] Taking Active Self   ondansetron (ZOFRAN) 4 MG tablet [id number] No Take 1 tablet (4 mg total) by mouth every 6 (six) hours as needed for nausea.  Patient not taking:  Reported on 07/03/2018   [name], MD Not Taking Active Self   Oxycodone HCl 10 MG TABS [id number] Yes Take 1 tablet (10 mg total) by mouth 4 (four) times daily as needed (pain). [name], MD Taking Active Self   potassium chloride 20 MEQ TBCR [id number] Yes Take 40 mEq by mouth daily. Oswald Hillock, MD Taking Active           ASSESSMENT  Drugs sorted by  system:  Neurologic/Psychologic: alprazolam  Cardiovascular: norvasc  Pulmonary/Allergy: albuterol   Endocrine: Levemir  Renal: lanthanum, rena-vit  Pain: oxycodone, APAP PRN  Miscellaneous: benadryl PRN  ASSESSMENT: Date Discharged from Hospital: 06/30/18 Date Medication Reconciliation Performed: 07/04/2018  New Medications at Discharge:  Lantus -->will be switched to Levemir per insurance coverage   PLAN: -Instructed patient to take new medications as prescribed once she obtains medications -Patient instructed to call me with questions or concerns -Will f/u with patient early tomorrow after she obtains new insulin/supplies    Regina Eck, PharmD, [place]  [phone]

## 2018-07-05 ENCOUNTER — Other Ambulatory Visit: Payer: Self-pay | Admitting: Pharmacist

## 2018-07-05 DIAGNOSIS — I132 Hypertensive heart and chronic kidney disease with heart failure and with stage 5 chronic kidney disease, or end stage renal disease: Secondary | ICD-10-CM | POA: Diagnosis not present

## 2018-07-05 DIAGNOSIS — E1122 Type 2 diabetes mellitus with diabetic chronic kidney disease: Secondary | ICD-10-CM | POA: Diagnosis not present

## 2018-07-05 DIAGNOSIS — N186 End stage renal disease: Secondary | ICD-10-CM | POA: Diagnosis not present

## 2018-07-05 DIAGNOSIS — I5032 Chronic diastolic (congestive) heart failure: Secondary | ICD-10-CM | POA: Diagnosis not present

## 2018-07-05 DIAGNOSIS — E662 Morbid (severe) obesity with alveolar hypoventilation: Secondary | ICD-10-CM | POA: Diagnosis not present

## 2018-07-05 DIAGNOSIS — I890 Lymphedema, not elsewhere classified: Secondary | ICD-10-CM | POA: Diagnosis not present

## 2018-07-05 NOTE — Addendum Note (Signed)
Addended by: Lottie Dawson D on: 07/05/2018 03:27 PM   Modules accepted: Level of Service, SmartSet

## 2018-07-05 NOTE — Telephone Encounter (Signed)
  This encounter was created in error - please disregard.

## 2018-07-05 NOTE — Patient Outreach (Signed)
[facility]) Care Management  07/05/2018  [name] Dec 11, 1966 [id number]  Successful call to [name] to follow up regarding obtaining new start insulin.  HIPAA identifiers verified.  Patient states that Dr. [name] called her and is unable to prescribe medications without being seen.  She says her appointment with Dr. [name] is not until 07/27/18.  Patient states she is being very careful with what she is eating.  Call made to Dr. [name] office to follow up on status of new RXs.  Message was left with RN Joy to check on the status of these medications.  Incoming call from [employer], [employer]) and she states Dr. [name] should be calling them in today! Will continue to follow  Plan: -I will follow up with the pharmacy & patient in the next 1-2 business days

## 2018-07-06 ENCOUNTER — Other Ambulatory Visit: Payer: Self-pay | Admitting: Pharmacist

## 2018-07-06 NOTE — Patient Outreach (Signed)
[facility]) Care Management  07/06/2018  [name] February 04, 1966 [id number]  Successful call placed to [facility] in [place], [place].  They state that Levemir pen for #1 box (5 pens) is $141 (entered as 90-day supply).  Amlodipine and albuterol inhaler RXs are also ready for pick up.  Successful call placed to [name].  HIPAA identifiers verified.  [name] is unable to make the $141 74-monthcopay for Levemir pens.  She is able to pay $47/month for the Levemir vial.  No LIS per [email or web address].[email or web address] Reviewed income with the patient and she does not qualify based on household income.  In the future, may attempt Basaglar insulin since patient assistance program available.  This application would require letter of hardship, but no out of pocket requirements.  Called MD office and left a message with RN to switch Levemir pens to Levemir vial (#1 vial)-vial is good for 28 days past vial puncture.  Also asked RN to call in ICD-10 code that was missing from blood glucose meter RX.   Plan: -Will follow up with patient in 1-3 business days to confirm insulin is picked up. -Will plan to see patient at home visit next week   JRegina Eck PharmD, [place] [phone]

## 2018-07-07 DIAGNOSIS — Z992 Dependence on renal dialysis: Secondary | ICD-10-CM | POA: Diagnosis not present

## 2018-07-07 DIAGNOSIS — N186 End stage renal disease: Secondary | ICD-10-CM | POA: Diagnosis not present

## 2018-07-08 DIAGNOSIS — N186 End stage renal disease: Secondary | ICD-10-CM | POA: Diagnosis not present

## 2018-07-08 DIAGNOSIS — Z992 Dependence on renal dialysis: Secondary | ICD-10-CM | POA: Diagnosis not present

## 2018-07-09 DIAGNOSIS — N186 End stage renal disease: Secondary | ICD-10-CM | POA: Diagnosis not present

## 2018-07-09 DIAGNOSIS — Z992 Dependence on renal dialysis: Secondary | ICD-10-CM | POA: Diagnosis not present

## 2018-07-10 ENCOUNTER — Other Ambulatory Visit: Payer: Self-pay | Admitting: Pharmacist

## 2018-07-10 ENCOUNTER — Other Ambulatory Visit: Payer: Self-pay | Admitting: *Deleted

## 2018-07-10 DIAGNOSIS — N186 End stage renal disease: Secondary | ICD-10-CM | POA: Diagnosis not present

## 2018-07-10 DIAGNOSIS — Z992 Dependence on renal dialysis: Secondary | ICD-10-CM | POA: Diagnosis not present

## 2018-07-10 NOTE — Patient Outreach (Signed)
Telephone call to pt for transition of care week 2, spoke with pt, HIPAA verified, pt reports no changes, is working with [facility] pharmacist for any medication issues.  THN CM Care Plan Problem One     Most Recent Value  Care Plan Problem One  Pt high risk for readmission related to multiple co-morbidities (CHF, DM, ESRD, anemia, obesity)  Role Documenting the Problem One  Care Management Coordinator  Care Plan for Problem One  Active  THN Long Term Goal   pt will verbalize/ demonstrate improved self care related to multiple co-morbidites (HF, DM, ESRD with peritoneal dialysis at home) within 60 days  THN Long Term Goal Start Date  07/03/18  Interventions for Problem One Long Term Goal  Pt reports dialysis is going well (peritoneal), home health continues working with pt, when questioned about today's weight, pt states " it's good, i'll show you the record when you get here"  THN CM Short Term Goal #1   pt will verbalize CHF action plan/ zones within 30 days  THN CM Short Term Goal #1 Start Date  07/03/18  Interventions for Short Term Goal #1  RN CM reinforced HF action plan     PLAN Continue weekly transition of care  See pt for initial home visit this week  [name] [facility], [place] [phone]

## 2018-07-10 NOTE — Patient Outreach (Signed)
[facility]) Care Management  07/10/2018  KAMILL FULBRIGHT 1966-12-18 [id number]   Successful call placed to [name].  HIPAA identifiers verified.  Patient is aware medications are ready to pick up at [facility] in [place], however will be unable to pick them up until this Thursday 07/13/18.  Encouraged patient to pick up BG meter to be able to monitor her BG in the meantime.  Her insulin (Levemir vial is $47 and is good for 42 days after open), amlodipine, and albuterol inhaler are ready for pick up. I reminded patient that RN is coming to her home on Thursday and I am happy to come to patient's home if needed to instruct on insulin, monitoring, etc.   Plan: -I will follow up on Thursday afternoon (07/13/18) to see if patient needs any further DM education or medication related questions.  Regina Eck, PharmD, [place]  [phone]

## 2018-07-11 ENCOUNTER — Ambulatory Visit: Payer: Medicare Other | Admitting: Pharmacist

## 2018-07-11 DIAGNOSIS — I5032 Chronic diastolic (congestive) heart failure: Secondary | ICD-10-CM | POA: Diagnosis not present

## 2018-07-11 DIAGNOSIS — N186 End stage renal disease: Secondary | ICD-10-CM | POA: Diagnosis not present

## 2018-07-11 DIAGNOSIS — E662 Morbid (severe) obesity with alveolar hypoventilation: Secondary | ICD-10-CM | POA: Diagnosis not present

## 2018-07-11 DIAGNOSIS — I890 Lymphedema, not elsewhere classified: Secondary | ICD-10-CM | POA: Diagnosis not present

## 2018-07-11 DIAGNOSIS — E1122 Type 2 diabetes mellitus with diabetic chronic kidney disease: Secondary | ICD-10-CM | POA: Diagnosis not present

## 2018-07-11 DIAGNOSIS — Z992 Dependence on renal dialysis: Secondary | ICD-10-CM | POA: Diagnosis not present

## 2018-07-11 DIAGNOSIS — I132 Hypertensive heart and chronic kidney disease with heart failure and with stage 5 chronic kidney disease, or end stage renal disease: Secondary | ICD-10-CM | POA: Diagnosis not present

## 2018-07-12 ENCOUNTER — Ambulatory Visit: Payer: Medicare Other | Admitting: Pharmacist

## 2018-07-12 DIAGNOSIS — N186 End stage renal disease: Secondary | ICD-10-CM | POA: Diagnosis not present

## 2018-07-12 DIAGNOSIS — Z992 Dependence on renal dialysis: Secondary | ICD-10-CM | POA: Diagnosis not present

## 2018-07-13 ENCOUNTER — Ambulatory Visit: Payer: Self-pay | Admitting: Pharmacist

## 2018-07-13 ENCOUNTER — Other Ambulatory Visit: Payer: Self-pay | Admitting: *Deleted

## 2018-07-13 ENCOUNTER — Encounter: Payer: Self-pay | Admitting: *Deleted

## 2018-07-13 ENCOUNTER — Other Ambulatory Visit: Payer: Self-pay | Admitting: Pharmacist

## 2018-07-13 DIAGNOSIS — I132 Hypertensive heart and chronic kidney disease with heart failure and with stage 5 chronic kidney disease, or end stage renal disease: Secondary | ICD-10-CM | POA: Diagnosis not present

## 2018-07-13 DIAGNOSIS — I890 Lymphedema, not elsewhere classified: Secondary | ICD-10-CM | POA: Diagnosis not present

## 2018-07-13 DIAGNOSIS — E662 Morbid (severe) obesity with alveolar hypoventilation: Secondary | ICD-10-CM | POA: Diagnosis not present

## 2018-07-13 DIAGNOSIS — I5032 Chronic diastolic (congestive) heart failure: Secondary | ICD-10-CM | POA: Diagnosis not present

## 2018-07-13 DIAGNOSIS — N186 End stage renal disease: Secondary | ICD-10-CM | POA: Diagnosis not present

## 2018-07-13 DIAGNOSIS — Z992 Dependence on renal dialysis: Secondary | ICD-10-CM | POA: Diagnosis not present

## 2018-07-13 DIAGNOSIS — E1122 Type 2 diabetes mellitus with diabetic chronic kidney disease: Secondary | ICD-10-CM | POA: Diagnosis not present

## 2018-07-13 NOTE — Patient Outreach (Signed)
[facility]) Care Management   07/13/2018  KARIANNA GUSMAN 02-Mar-1966 [id number]  SOLAE NORLING is an 52 y.o. female  Subjective: Initial home visit with pt, HIPAA verified, pt reports she lives with disabled husband and son.  Pt to get levemir, glucometer, albuterol, amlodipine from pharmacy today. Home health continues working with pt, pt being telemonitored by [facility] dialysis in [place], pt has Ipad, scales, BP cuff and transmits daily, pt states " they call me if any issues"  Pt states she sees Dr. Lowanda Foster monthly and has bloodwork done, pt feels peritoneal dialysis is going well and she has been on this for several weeks now.    Objective:   Vitals:   07/13/18 1159  BP: (!) 146/70  Pulse: 91  Resp: 18  SpO2: 96%  Weight: (!) 367 lb 14.4 oz (166.9 kg)  Height: 1.651 m (_0 )   ROS  Physical Exam  Constitutional: She is oriented to person, place, and time. She appears well-developed and well-nourished.  HENT:  Head: Normocephalic.  Neck: Normal range of motion. Neck supple.  Cardiovascular: Normal rate and regular rhythm.  Respiratory: Effort normal and breath sounds normal.  GI: Soft. Bowel sounds are normal.  Musculoskeletal: She exhibits edema.  Pt has severe lymphedema both legs  Neurological: She is alert and oriented to person, place, and time.  Skin: Skin is warm and dry.  Psychiatric: She has a normal mood and affect. Her behavior is normal. Judgment and thought content normal.    Encounter Medications:   Outpatient Encounter Medications as of 07/13/2018  Medication Sig  . acetaminophen (TYLENOL) 325 MG tablet Take 650 mg by mouth every 6 (six) hours as needed for moderate pain or fever.   Marland Kitchen albuterol (PROVENTIL) (2.5 MG/3ML) 0.083% nebulizer solution Take 2.5 mg by nebulization every 6 (six) hours as needed for wheezing or shortness of breath.  . ALPRAZolam (XANAX) 0.5 MG tablet Take 0.5 mg by mouth 3 (three) times daily as needed for  anxiety or sleep.   . diphenhydrAMINE (BENADRYL) 25 mg capsule Take 25 mg by mouth every 6 (six) hours as needed for allergies.  Marland Kitchen lanthanum (FOSRENOL) 500 MG chewable tablet Chew 1,000-1,500 mg by mouth 5 (five) times daily. Patient takes 3 tablets by mouth three times a day with meals and 2 tablets by mouth twice a day with snacks  . multivitamin (RENA-VIT) TABS tablet Take 1 tablet by mouth daily.  . Oxycodone HCl 10 MG TABS Take 1 tablet (10 mg total) by mouth 4 (four) times daily as needed (pain).  . potassium chloride 20 MEQ TBCR Take 40 mEq by mouth daily.  Marland Kitchen amLODipine (NORVASC) 10 MG tablet Take 0.5 tablets (5 mg total) by mouth daily.  . blood glucose meter kit and supplies KIT Dispense based on patient and insurance preference. Use up to four times daily as directed. (FOR ICD-9 250.00, 250.01).  . [DISCONTINUED] insulin glargine (LANTUS) 100 UNIT/ML injection Inject 0.1 mLs (10 Units total) into the skin at bedtime. (Patient not taking: Reported on 07/03/2018)  . [DISCONTINUED] insulin lispro (HUMALOG) 100 UNIT/ML injection Inject 60 Units into the skin 2 (two) times daily.    . [DISCONTINUED] ondansetron (ZOFRAN) 4 MG tablet Take 1 tablet (4 mg total) by mouth every 6 (six) hours as needed for nausea. (Patient not taking: Reported on 07/03/2018)   No facility-administered encounter medications on file as of 07/13/2018.     Functional Status:   In your present state of health,  do you have any difficulty performing the following activities: 07/13/2018 06/27/2018  Hearing? N N  Vision? N N  Difficulty concentrating or making decisions? N N  Walking or climbing stairs? Y Y  Dressing or bathing? Y Y  Doing errands, shopping? Tempie Donning  Preparing Food and eating ? N -  Using the Toilet? N -  In the past six months, have you accidently leaked urine? N -  Comment pt barely has any urine output  approximately 20 ounces in 48 hours -  Do you have problems with loss of bowel control? N -  Managing  your Medications? N -  Managing your Finances? N -  Housekeeping or managing your Housekeeping? Y -  Some recent data might be hidden    Fall/Depression Screening:    Fall Risk  07/13/2018  Falls in the past year? Yes  Number falls in past yr: 1  Injury with Fall? Yes  Risk Factor Category  High Fall Risk  Risk for fall due to : History of fall(s);Medication side effect  Follow up Education provided;Falls evaluation completed;Falls prevention discussed   PHQ 2/9 Scores 07/13/2018  PHQ - 2 Score 0    Assessment:  RN CM reviewed medications with pt, reviewed 32 ounces fluid restrictions, signs/ symptoms infection.  Pt to start checking CBG daily.  Pt to see primary MD 07/26/18.  Pt uses power wheelchair most of time due to difficulty walking related to lymphedema/ obesity.  RN CM faxed barrier letter and initial home visit to primary MD Dr. [name].  THN CM Care Plan Problem One     Most Recent Value  Care Plan Problem One  Pt high risk for readmission related to multiple co-morbidities (CHF, DM, ESRD, anemia, obesity)  Role Documenting the Problem One  Care Management Coordinator  Care Plan for Problem One  Active  THN Long Term Goal   pt will verbalize/ demonstrate improved self care related to multiple co-morbidites (HF, DM, ESRD with peritoneal dialysis at home) within 60 days  THN Long Term Goal Start Date  07/03/18  Interventions for Problem One Long Term Goal  RN CM gave pt [facility] calendar and reviewed with pt, ask pt to record CBG and weight in calendar and take to MD appointments, pt already has 24 hour nurse line magnet., pt reports peritoneal dialysis going well _0   THN CM Short Term Goal #1   pt will verbalize CHF action plan/ zones within 30 days  THN CM Short Term Goal #1 Start Date  07/03/18  Interventions for Short Term Goal #1  Rn CM gave copy of CHF action plan/ zones and reviewed with pt.    THN CM Care Plan Problem Two     Most Recent Value  Care Plan Problem Two   Knowledge deficit related to diabetes  Role Documenting the Problem Two  Care Management Coordinator  Care Plan for Problem Two  Active  THN CM Short Term Goal #1   Pt will verbalize nutritious food choices/ follow plate method within 30 days  THN CM Short Term Goal #1 Start Date  07/13/18  Interventions for Short Term Goal #2   RN CM reviewed plate method, gave pt "Living well with DM" and pt is to read, reviewed renal diet and food choices  THN CM Short Term Goal #2   pt will obtain glucometer today and check CBG daily and record within 30 days  THN CM Short Term Goal #2 Start Date  07/13/18  Interventions  for Short Term Goal #2  Pt reports she is picking up glucometer today from pharmacy and does know how to check CBG and will start recording readings.      Plan: continue transition of care See pt for home visit next month  [name] [facility], [place] Coordinator [phone]

## 2018-07-13 NOTE — Patient Outreach (Signed)
[facility]) Care Management  07/13/2018  [name] June 07, 1966 [id number]  Successful call to [name] regarding medication management.  HIPAA identifiers verified.  Patient was very pleasant and stated she was doing well.  Patient confirmed that she had picked up the following medications on 07/13/18 at [place] (insulin vial) -amlodopine -BG meter/testing supplies -albuterol inhaler  Updated med list to reflect new insulin changes.  Patient denies issues with BG testing ands he has also injected insulin in the past.  [facility] Pharmacist offered additional counseling and support, but patient confident in her ability to do so.  Callback information provided in the event patient has any questions or concerns regarding her medications.  Plan:  -Will follow up with patient telephonically next week to see if she has any questions or concerns with her medications (new re-start insulin)  Regina Eck, PharmD, [place]  [phone]

## 2018-07-14 DIAGNOSIS — Z992 Dependence on renal dialysis: Secondary | ICD-10-CM | POA: Diagnosis not present

## 2018-07-14 DIAGNOSIS — N186 End stage renal disease: Secondary | ICD-10-CM | POA: Diagnosis not present

## 2018-07-15 DIAGNOSIS — Z992 Dependence on renal dialysis: Secondary | ICD-10-CM | POA: Diagnosis not present

## 2018-07-15 DIAGNOSIS — N186 End stage renal disease: Secondary | ICD-10-CM | POA: Diagnosis not present

## 2018-07-16 DIAGNOSIS — Z992 Dependence on renal dialysis: Secondary | ICD-10-CM | POA: Diagnosis not present

## 2018-07-16 DIAGNOSIS — N186 End stage renal disease: Secondary | ICD-10-CM | POA: Diagnosis not present

## 2018-07-17 ENCOUNTER — Other Ambulatory Visit: Payer: Self-pay | Admitting: *Deleted

## 2018-07-17 DIAGNOSIS — Z992 Dependence on renal dialysis: Secondary | ICD-10-CM | POA: Diagnosis not present

## 2018-07-17 DIAGNOSIS — N186 End stage renal disease: Secondary | ICD-10-CM | POA: Diagnosis not present

## 2018-07-17 NOTE — Patient Outreach (Signed)
Telephone call to pt for transition of care week 3, no answer to telephone [phone], left voicemail requesting return phone call. RN CM mailed unsuccessful outreach letter to patient's home.  PLAN Outreach pt in 3-4 business days  [name] [facility], [place] [phone]

## 2018-07-18 ENCOUNTER — Ambulatory Visit: Payer: Self-pay | Admitting: Pharmacist

## 2018-07-18 DIAGNOSIS — Z992 Dependence on renal dialysis: Secondary | ICD-10-CM | POA: Diagnosis not present

## 2018-07-18 DIAGNOSIS — N186 End stage renal disease: Secondary | ICD-10-CM | POA: Diagnosis not present

## 2018-07-19 DIAGNOSIS — I132 Hypertensive heart and chronic kidney disease with heart failure and with stage 5 chronic kidney disease, or end stage renal disease: Secondary | ICD-10-CM | POA: Diagnosis not present

## 2018-07-19 DIAGNOSIS — N186 End stage renal disease: Secondary | ICD-10-CM | POA: Diagnosis not present

## 2018-07-19 DIAGNOSIS — E662 Morbid (severe) obesity with alveolar hypoventilation: Secondary | ICD-10-CM | POA: Diagnosis not present

## 2018-07-19 DIAGNOSIS — Z992 Dependence on renal dialysis: Secondary | ICD-10-CM | POA: Diagnosis not present

## 2018-07-19 DIAGNOSIS — E1122 Type 2 diabetes mellitus with diabetic chronic kidney disease: Secondary | ICD-10-CM | POA: Diagnosis not present

## 2018-07-19 DIAGNOSIS — I890 Lymphedema, not elsewhere classified: Secondary | ICD-10-CM | POA: Diagnosis not present

## 2018-07-19 DIAGNOSIS — I5032 Chronic diastolic (congestive) heart failure: Secondary | ICD-10-CM | POA: Diagnosis not present

## 2018-07-20 ENCOUNTER — Other Ambulatory Visit: Payer: Self-pay | Admitting: *Deleted

## 2018-07-20 DIAGNOSIS — T782XXA Anaphylactic shock, unspecified, initial encounter: Secondary | ICD-10-CM | POA: Diagnosis not present

## 2018-07-20 DIAGNOSIS — N186 End stage renal disease: Secondary | ICD-10-CM | POA: Diagnosis not present

## 2018-07-20 DIAGNOSIS — Z23 Encounter for immunization: Secondary | ICD-10-CM | POA: Diagnosis not present

## 2018-07-20 DIAGNOSIS — Z992 Dependence on renal dialysis: Secondary | ICD-10-CM | POA: Diagnosis not present

## 2018-07-20 DIAGNOSIS — D509 Iron deficiency anemia, unspecified: Secondary | ICD-10-CM | POA: Diagnosis not present

## 2018-07-20 NOTE — Patient Outreach (Signed)
Telephone call to pt for transition of care week 3, spoke with pt HIPPA verified, pt reports she did get all remaining medications and glucometer from pharmacy, reports CBG today 158 and all readings in 100's range, checking BID, pt states home health continues, peritoneal dialysis going well except for bladder stones that " is just a part of dialysis due to calcium buildup, but I'm passing them"  Pt reports no falls, no changes.  THN CM Care Plan Problem One     Most Recent Value  Care Plan Problem One  Pt high risk for readmission related to multiple co-morbidities (CHF, DM, ESRD, anemia, obesity)  Role Documenting the Problem One  Care Management Coordinator  Care Plan for Problem One  Active  THN Long Term Goal   pt will verbalize/ demonstrate improved self care related to multiple co-morbidites (HF, DM, ESRD with peritoneal dialysis at home) within 60 days  THN Long Term Goal Start Date  07/03/18  Interventions for Problem One Long Term Goal  Rn CM confirmed with pt she did obtain remaining medications and glucometer and is checking CBG BID  THN CM Short Term Goal #1   pt will verbalize CHF action plan/ zones within 30 days  THN CM Short Term Goal #1 Start Date  07/03/18  Interventions for Short Term Goal #1  RN CM reinforced HF actionplan    [facility] CM Care Plan Problem Two     Most Recent Value  Care Plan Problem Two  Knowledge deficit related to diabetes  Role Documenting the Problem Two  Care Management Coordinator  Care Plan for Problem Two  Active  THN CM Short Term Goal #1   Pt will verbalize nutritious food choices/ follow plate method within 30 days  THN CM Short Term Goal #1 Start Date  07/13/18  Interventions for Short Term Goal #2   RN CM reviewed plate method  THN CM Short Term Goal #2   pt will obtain glucometer today and check CBG daily and record within 30 days  THN CM Short Term Goal #2 Start Date  07/13/18  [facility] CM Short Term Goal #2 Met Date  07/20/18  Interventions for  Short Term Goal #2  RN CM reviewed CBG readings with pt      PLAN Continue weekly transition of care calls See pt for home visit this month  [name] [facility], [place] Coordinator [phone]

## 2018-07-21 ENCOUNTER — Ambulatory Visit: Payer: Self-pay | Admitting: Pharmacist

## 2018-07-21 ENCOUNTER — Other Ambulatory Visit: Payer: Self-pay | Admitting: Pharmacist

## 2018-07-21 DIAGNOSIS — T782XXA Anaphylactic shock, unspecified, initial encounter: Secondary | ICD-10-CM | POA: Diagnosis not present

## 2018-07-21 DIAGNOSIS — D509 Iron deficiency anemia, unspecified: Secondary | ICD-10-CM | POA: Diagnosis not present

## 2018-07-21 DIAGNOSIS — N186 End stage renal disease: Secondary | ICD-10-CM | POA: Diagnosis not present

## 2018-07-21 DIAGNOSIS — Z23 Encounter for immunization: Secondary | ICD-10-CM | POA: Diagnosis not present

## 2018-07-21 DIAGNOSIS — Z992 Dependence on renal dialysis: Secondary | ICD-10-CM | POA: Diagnosis not present

## 2018-07-21 NOTE — Patient Outreach (Addendum)
[facility]) Care Management  07/21/2018  [name] Nov 04, 1966 [id number]  Successful call to [name] regarding medication management.  HIPAA identifiers verified.  Patient was very pleasant and stated she was doing well.  She states she has no questions or concerns regarding her medications, but is having issues with bladder stones that are likely secondary to her dialysis.  Patient has obtained all of her medications for this month at [facility] in [place], [place].     PLAN -Will close pharmacy case at this time and route closure letter to PCP   Thank you for allowing me to participate in the care of this patient.  Patient aware she can contact [facility] Pharmacist if she has new concerns in the future.  Regina Eck, PharmD, [place]  [phone]

## 2018-07-22 DIAGNOSIS — N186 End stage renal disease: Secondary | ICD-10-CM | POA: Diagnosis not present

## 2018-07-22 DIAGNOSIS — T782XXA Anaphylactic shock, unspecified, initial encounter: Secondary | ICD-10-CM | POA: Diagnosis not present

## 2018-07-22 DIAGNOSIS — Z23 Encounter for immunization: Secondary | ICD-10-CM | POA: Diagnosis not present

## 2018-07-22 DIAGNOSIS — Z992 Dependence on renal dialysis: Secondary | ICD-10-CM | POA: Diagnosis not present

## 2018-07-22 DIAGNOSIS — D509 Iron deficiency anemia, unspecified: Secondary | ICD-10-CM | POA: Diagnosis not present

## 2018-07-23 DIAGNOSIS — Z992 Dependence on renal dialysis: Secondary | ICD-10-CM | POA: Diagnosis not present

## 2018-07-23 DIAGNOSIS — T782XXA Anaphylactic shock, unspecified, initial encounter: Secondary | ICD-10-CM | POA: Diagnosis not present

## 2018-07-23 DIAGNOSIS — D509 Iron deficiency anemia, unspecified: Secondary | ICD-10-CM | POA: Diagnosis not present

## 2018-07-23 DIAGNOSIS — Z23 Encounter for immunization: Secondary | ICD-10-CM | POA: Diagnosis not present

## 2018-07-23 DIAGNOSIS — N186 End stage renal disease: Secondary | ICD-10-CM | POA: Diagnosis not present

## 2018-07-24 DIAGNOSIS — D509 Iron deficiency anemia, unspecified: Secondary | ICD-10-CM | POA: Diagnosis not present

## 2018-07-24 DIAGNOSIS — T782XXA Anaphylactic shock, unspecified, initial encounter: Secondary | ICD-10-CM | POA: Diagnosis not present

## 2018-07-24 DIAGNOSIS — Z992 Dependence on renal dialysis: Secondary | ICD-10-CM | POA: Diagnosis not present

## 2018-07-24 DIAGNOSIS — I890 Lymphedema, not elsewhere classified: Secondary | ICD-10-CM | POA: Diagnosis not present

## 2018-07-24 DIAGNOSIS — I5032 Chronic diastolic (congestive) heart failure: Secondary | ICD-10-CM | POA: Diagnosis not present

## 2018-07-24 DIAGNOSIS — E662 Morbid (severe) obesity with alveolar hypoventilation: Secondary | ICD-10-CM | POA: Diagnosis not present

## 2018-07-24 DIAGNOSIS — E1122 Type 2 diabetes mellitus with diabetic chronic kidney disease: Secondary | ICD-10-CM | POA: Diagnosis not present

## 2018-07-24 DIAGNOSIS — N186 End stage renal disease: Secondary | ICD-10-CM | POA: Diagnosis not present

## 2018-07-24 DIAGNOSIS — Z23 Encounter for immunization: Secondary | ICD-10-CM | POA: Diagnosis not present

## 2018-07-24 DIAGNOSIS — I132 Hypertensive heart and chronic kidney disease with heart failure and with stage 5 chronic kidney disease, or end stage renal disease: Secondary | ICD-10-CM | POA: Diagnosis not present

## 2018-07-25 DIAGNOSIS — N186 End stage renal disease: Secondary | ICD-10-CM | POA: Diagnosis not present

## 2018-07-25 DIAGNOSIS — Z992 Dependence on renal dialysis: Secondary | ICD-10-CM | POA: Diagnosis not present

## 2018-07-25 DIAGNOSIS — Z23 Encounter for immunization: Secondary | ICD-10-CM | POA: Diagnosis not present

## 2018-07-25 DIAGNOSIS — T782XXA Anaphylactic shock, unspecified, initial encounter: Secondary | ICD-10-CM | POA: Diagnosis not present

## 2018-07-25 DIAGNOSIS — D509 Iron deficiency anemia, unspecified: Secondary | ICD-10-CM | POA: Diagnosis not present

## 2018-07-26 ENCOUNTER — Other Ambulatory Visit: Payer: Self-pay | Admitting: *Deleted

## 2018-07-26 DIAGNOSIS — N186 End stage renal disease: Secondary | ICD-10-CM | POA: Diagnosis not present

## 2018-07-26 DIAGNOSIS — D509 Iron deficiency anemia, unspecified: Secondary | ICD-10-CM | POA: Diagnosis not present

## 2018-07-26 DIAGNOSIS — Z992 Dependence on renal dialysis: Secondary | ICD-10-CM | POA: Diagnosis not present

## 2018-07-26 DIAGNOSIS — J9611 Chronic respiratory failure with hypoxia: Secondary | ICD-10-CM | POA: Diagnosis not present

## 2018-07-26 DIAGNOSIS — I120 Hypertensive chronic kidney disease with stage 5 chronic kidney disease or end stage renal disease: Secondary | ICD-10-CM | POA: Diagnosis not present

## 2018-07-26 DIAGNOSIS — Z23 Encounter for immunization: Secondary | ICD-10-CM | POA: Diagnosis not present

## 2018-07-26 DIAGNOSIS — T782XXA Anaphylactic shock, unspecified, initial encounter: Secondary | ICD-10-CM | POA: Diagnosis not present

## 2018-07-26 DIAGNOSIS — N185 Chronic kidney disease, stage 5: Secondary | ICD-10-CM | POA: Diagnosis not present

## 2018-07-26 DIAGNOSIS — I10 Essential (primary) hypertension: Secondary | ICD-10-CM | POA: Diagnosis not present

## 2018-07-26 NOTE — Patient Outreach (Signed)
Telephone call to pt for transition of care week 4, spoke with pt, HIPAA verified, pt reports she is at dialysis clinic now for a 5 hour test that will determine if peritoneal dialysis is working well for her.  Pt reports " blood sugar good, no changes" , bladder stones " come and go", home health continues working with pt. No medication changes reported and peritoneal dialysis is going well at home.    THN CM Care Plan Problem One     Most Recent Value  Care Plan Problem One  Pt high risk for readmission related to multiple co-morbidities (CHF, DM, ESRD, anemia, obesity)  Role Documenting the Problem One  Care Management Coordinator  Care Plan for Problem One  Active  THN Long Term Goal   pt will verbalize/ demonstrate improved self care related to multiple co-morbidites (HF, DM, ESRD with peritoneal dialysis at home) within 60 days  THN Long Term Goal Start Date  07/03/18  Interventions for Problem One Long Term Goal  RN CM reviewed plan of care, pt continues checking CBG, continues with peritoneal dialysis, home health continues, no changes reported.  THN CM Short Term Goal #1   pt will verbalize CHF action plan/ zones within 30 days  THN CM Short Term Goal #1 Start Date  07/03/18  Interventions for Short Term Goal #1  RN CM reiterated HF action plan    [facility] CM Care Plan Problem Two     Most Recent Value  Care Plan Problem Two  Knowledge deficit related to diabetes  Role Documenting the Problem Two  Care Management Coordinator  Care Plan for Problem Two  Active  THN CM Short Term Goal #1   Pt will verbalize nutritious food choices/ follow plate method within 30 days  THN CM Short Term Goal #1 Start Date  07/13/18  Interventions for Short Term Goal #2   Pt reports no changes with CBG, trying to follow prescribed diet and make improvements with diet  THN CM Short Term Goal #2   pt will obtain glucometer today and check CBG daily and record within 30 days  THN CM Short Term Goal #2 Start Date   07/13/18  [facility] CM Short Term Goal #2 Met Date  07/20/18      PLAN See pt for home visit this month  [name] [facility], [place] Coordinator [phone]

## 2018-07-27 DIAGNOSIS — Z992 Dependence on renal dialysis: Secondary | ICD-10-CM | POA: Diagnosis not present

## 2018-07-27 DIAGNOSIS — T782XXA Anaphylactic shock, unspecified, initial encounter: Secondary | ICD-10-CM | POA: Diagnosis not present

## 2018-07-27 DIAGNOSIS — N186 End stage renal disease: Secondary | ICD-10-CM | POA: Diagnosis not present

## 2018-07-27 DIAGNOSIS — D509 Iron deficiency anemia, unspecified: Secondary | ICD-10-CM | POA: Diagnosis not present

## 2018-07-27 DIAGNOSIS — Z23 Encounter for immunization: Secondary | ICD-10-CM | POA: Diagnosis not present

## 2018-07-28 DIAGNOSIS — Z992 Dependence on renal dialysis: Secondary | ICD-10-CM | POA: Diagnosis not present

## 2018-07-28 DIAGNOSIS — N186 End stage renal disease: Secondary | ICD-10-CM | POA: Diagnosis not present

## 2018-07-28 DIAGNOSIS — T782XXA Anaphylactic shock, unspecified, initial encounter: Secondary | ICD-10-CM | POA: Diagnosis not present

## 2018-07-28 DIAGNOSIS — Z23 Encounter for immunization: Secondary | ICD-10-CM | POA: Diagnosis not present

## 2018-07-28 DIAGNOSIS — D509 Iron deficiency anemia, unspecified: Secondary | ICD-10-CM | POA: Diagnosis not present

## 2018-07-29 DIAGNOSIS — N186 End stage renal disease: Secondary | ICD-10-CM | POA: Diagnosis not present

## 2018-07-29 DIAGNOSIS — T782XXA Anaphylactic shock, unspecified, initial encounter: Secondary | ICD-10-CM | POA: Diagnosis not present

## 2018-07-29 DIAGNOSIS — D509 Iron deficiency anemia, unspecified: Secondary | ICD-10-CM | POA: Diagnosis not present

## 2018-07-29 DIAGNOSIS — Z23 Encounter for immunization: Secondary | ICD-10-CM | POA: Diagnosis not present

## 2018-07-29 DIAGNOSIS — Z992 Dependence on renal dialysis: Secondary | ICD-10-CM | POA: Diagnosis not present

## 2018-07-30 DIAGNOSIS — Z992 Dependence on renal dialysis: Secondary | ICD-10-CM | POA: Diagnosis not present

## 2018-07-30 DIAGNOSIS — D509 Iron deficiency anemia, unspecified: Secondary | ICD-10-CM | POA: Diagnosis not present

## 2018-07-30 DIAGNOSIS — T782XXA Anaphylactic shock, unspecified, initial encounter: Secondary | ICD-10-CM | POA: Diagnosis not present

## 2018-07-30 DIAGNOSIS — N186 End stage renal disease: Secondary | ICD-10-CM | POA: Diagnosis not present

## 2018-07-30 DIAGNOSIS — Z23 Encounter for immunization: Secondary | ICD-10-CM | POA: Diagnosis not present

## 2018-07-31 DIAGNOSIS — N186 End stage renal disease: Secondary | ICD-10-CM | POA: Diagnosis not present

## 2018-07-31 DIAGNOSIS — Z23 Encounter for immunization: Secondary | ICD-10-CM | POA: Diagnosis not present

## 2018-07-31 DIAGNOSIS — T782XXA Anaphylactic shock, unspecified, initial encounter: Secondary | ICD-10-CM | POA: Diagnosis not present

## 2018-07-31 DIAGNOSIS — Z992 Dependence on renal dialysis: Secondary | ICD-10-CM | POA: Diagnosis not present

## 2018-07-31 DIAGNOSIS — D509 Iron deficiency anemia, unspecified: Secondary | ICD-10-CM | POA: Diagnosis not present

## 2018-08-01 DIAGNOSIS — Z992 Dependence on renal dialysis: Secondary | ICD-10-CM | POA: Diagnosis not present

## 2018-08-01 DIAGNOSIS — D509 Iron deficiency anemia, unspecified: Secondary | ICD-10-CM | POA: Diagnosis not present

## 2018-08-01 DIAGNOSIS — N186 End stage renal disease: Secondary | ICD-10-CM | POA: Diagnosis not present

## 2018-08-01 DIAGNOSIS — T782XXA Anaphylactic shock, unspecified, initial encounter: Secondary | ICD-10-CM | POA: Diagnosis not present

## 2018-08-01 DIAGNOSIS — Z23 Encounter for immunization: Secondary | ICD-10-CM | POA: Diagnosis not present

## 2018-08-02 DIAGNOSIS — Z23 Encounter for immunization: Secondary | ICD-10-CM | POA: Diagnosis not present

## 2018-08-02 DIAGNOSIS — D509 Iron deficiency anemia, unspecified: Secondary | ICD-10-CM | POA: Diagnosis not present

## 2018-08-02 DIAGNOSIS — N186 End stage renal disease: Secondary | ICD-10-CM | POA: Diagnosis not present

## 2018-08-02 DIAGNOSIS — T782XXA Anaphylactic shock, unspecified, initial encounter: Secondary | ICD-10-CM | POA: Diagnosis not present

## 2018-08-02 DIAGNOSIS — Z992 Dependence on renal dialysis: Secondary | ICD-10-CM | POA: Diagnosis not present

## 2018-08-03 DIAGNOSIS — Z23 Encounter for immunization: Secondary | ICD-10-CM | POA: Diagnosis not present

## 2018-08-03 DIAGNOSIS — T782XXA Anaphylactic shock, unspecified, initial encounter: Secondary | ICD-10-CM | POA: Diagnosis not present

## 2018-08-03 DIAGNOSIS — N186 End stage renal disease: Secondary | ICD-10-CM | POA: Diagnosis not present

## 2018-08-03 DIAGNOSIS — Z992 Dependence on renal dialysis: Secondary | ICD-10-CM | POA: Diagnosis not present

## 2018-08-03 DIAGNOSIS — D509 Iron deficiency anemia, unspecified: Secondary | ICD-10-CM | POA: Diagnosis not present

## 2018-08-04 DIAGNOSIS — Z23 Encounter for immunization: Secondary | ICD-10-CM | POA: Diagnosis not present

## 2018-08-04 DIAGNOSIS — I132 Hypertensive heart and chronic kidney disease with heart failure and with stage 5 chronic kidney disease, or end stage renal disease: Secondary | ICD-10-CM | POA: Diagnosis not present

## 2018-08-04 DIAGNOSIS — D509 Iron deficiency anemia, unspecified: Secondary | ICD-10-CM | POA: Diagnosis not present

## 2018-08-04 DIAGNOSIS — I5032 Chronic diastolic (congestive) heart failure: Secondary | ICD-10-CM | POA: Diagnosis not present

## 2018-08-04 DIAGNOSIS — I890 Lymphedema, not elsewhere classified: Secondary | ICD-10-CM | POA: Diagnosis not present

## 2018-08-04 DIAGNOSIS — E1122 Type 2 diabetes mellitus with diabetic chronic kidney disease: Secondary | ICD-10-CM | POA: Diagnosis not present

## 2018-08-04 DIAGNOSIS — N186 End stage renal disease: Secondary | ICD-10-CM | POA: Diagnosis not present

## 2018-08-04 DIAGNOSIS — T782XXA Anaphylactic shock, unspecified, initial encounter: Secondary | ICD-10-CM | POA: Diagnosis not present

## 2018-08-04 DIAGNOSIS — Z992 Dependence on renal dialysis: Secondary | ICD-10-CM | POA: Diagnosis not present

## 2018-08-04 DIAGNOSIS — E662 Morbid (severe) obesity with alveolar hypoventilation: Secondary | ICD-10-CM | POA: Diagnosis not present

## 2018-08-05 DIAGNOSIS — Z992 Dependence on renal dialysis: Secondary | ICD-10-CM | POA: Diagnosis not present

## 2018-08-05 DIAGNOSIS — T782XXA Anaphylactic shock, unspecified, initial encounter: Secondary | ICD-10-CM | POA: Diagnosis not present

## 2018-08-05 DIAGNOSIS — D509 Iron deficiency anemia, unspecified: Secondary | ICD-10-CM | POA: Diagnosis not present

## 2018-08-05 DIAGNOSIS — Z23 Encounter for immunization: Secondary | ICD-10-CM | POA: Diagnosis not present

## 2018-08-05 DIAGNOSIS — N186 End stage renal disease: Secondary | ICD-10-CM | POA: Diagnosis not present

## 2018-08-06 DIAGNOSIS — Z23 Encounter for immunization: Secondary | ICD-10-CM | POA: Diagnosis not present

## 2018-08-06 DIAGNOSIS — N186 End stage renal disease: Secondary | ICD-10-CM | POA: Diagnosis not present

## 2018-08-06 DIAGNOSIS — T782XXA Anaphylactic shock, unspecified, initial encounter: Secondary | ICD-10-CM | POA: Diagnosis not present

## 2018-08-06 DIAGNOSIS — Z992 Dependence on renal dialysis: Secondary | ICD-10-CM | POA: Diagnosis not present

## 2018-08-06 DIAGNOSIS — D509 Iron deficiency anemia, unspecified: Secondary | ICD-10-CM | POA: Diagnosis not present

## 2018-08-07 DIAGNOSIS — N186 End stage renal disease: Secondary | ICD-10-CM | POA: Diagnosis not present

## 2018-08-07 DIAGNOSIS — Z992 Dependence on renal dialysis: Secondary | ICD-10-CM | POA: Diagnosis not present

## 2018-08-07 DIAGNOSIS — Z23 Encounter for immunization: Secondary | ICD-10-CM | POA: Diagnosis not present

## 2018-08-07 DIAGNOSIS — T782XXA Anaphylactic shock, unspecified, initial encounter: Secondary | ICD-10-CM | POA: Diagnosis not present

## 2018-08-07 DIAGNOSIS — D509 Iron deficiency anemia, unspecified: Secondary | ICD-10-CM | POA: Diagnosis not present

## 2018-08-08 DIAGNOSIS — T782XXA Anaphylactic shock, unspecified, initial encounter: Secondary | ICD-10-CM | POA: Diagnosis not present

## 2018-08-08 DIAGNOSIS — Z992 Dependence on renal dialysis: Secondary | ICD-10-CM | POA: Diagnosis not present

## 2018-08-08 DIAGNOSIS — Z23 Encounter for immunization: Secondary | ICD-10-CM | POA: Diagnosis not present

## 2018-08-08 DIAGNOSIS — N186 End stage renal disease: Secondary | ICD-10-CM | POA: Diagnosis not present

## 2018-08-08 DIAGNOSIS — D509 Iron deficiency anemia, unspecified: Secondary | ICD-10-CM | POA: Diagnosis not present

## 2018-08-09 DIAGNOSIS — Z992 Dependence on renal dialysis: Secondary | ICD-10-CM | POA: Diagnosis not present

## 2018-08-09 DIAGNOSIS — Z23 Encounter for immunization: Secondary | ICD-10-CM | POA: Diagnosis not present

## 2018-08-09 DIAGNOSIS — D509 Iron deficiency anemia, unspecified: Secondary | ICD-10-CM | POA: Diagnosis not present

## 2018-08-09 DIAGNOSIS — T782XXA Anaphylactic shock, unspecified, initial encounter: Secondary | ICD-10-CM | POA: Diagnosis not present

## 2018-08-09 DIAGNOSIS — N186 End stage renal disease: Secondary | ICD-10-CM | POA: Diagnosis not present

## 2018-08-10 ENCOUNTER — Other Ambulatory Visit: Payer: Self-pay | Admitting: *Deleted

## 2018-08-10 DIAGNOSIS — Z992 Dependence on renal dialysis: Secondary | ICD-10-CM | POA: Diagnosis not present

## 2018-08-10 DIAGNOSIS — D509 Iron deficiency anemia, unspecified: Secondary | ICD-10-CM | POA: Diagnosis not present

## 2018-08-10 DIAGNOSIS — T782XXA Anaphylactic shock, unspecified, initial encounter: Secondary | ICD-10-CM | POA: Diagnosis not present

## 2018-08-10 DIAGNOSIS — Z23 Encounter for immunization: Secondary | ICD-10-CM | POA: Diagnosis not present

## 2018-08-10 DIAGNOSIS — N186 End stage renal disease: Secondary | ICD-10-CM | POA: Diagnosis not present

## 2018-08-10 NOTE — Patient Outreach (Addendum)
RN CM called pt to remind her of today's scheduled home visit, spoke with pt, HIPAA verified, pt reports she is not at home and will be gone all day.  Pt unable to reschedule home visit at present and agreeable to telephone assessment within the next week.  PLAN Call pt for telephone assessment next week  [name] [facility], [place] Coordinator [phone]

## 2018-08-11 DIAGNOSIS — T782XXA Anaphylactic shock, unspecified, initial encounter: Secondary | ICD-10-CM | POA: Diagnosis not present

## 2018-08-11 DIAGNOSIS — Z23 Encounter for immunization: Secondary | ICD-10-CM | POA: Diagnosis not present

## 2018-08-11 DIAGNOSIS — N186 End stage renal disease: Secondary | ICD-10-CM | POA: Diagnosis not present

## 2018-08-11 DIAGNOSIS — Z992 Dependence on renal dialysis: Secondary | ICD-10-CM | POA: Diagnosis not present

## 2018-08-11 DIAGNOSIS — D509 Iron deficiency anemia, unspecified: Secondary | ICD-10-CM | POA: Diagnosis not present

## 2018-08-12 DIAGNOSIS — Z992 Dependence on renal dialysis: Secondary | ICD-10-CM | POA: Diagnosis not present

## 2018-08-12 DIAGNOSIS — N186 End stage renal disease: Secondary | ICD-10-CM | POA: Diagnosis not present

## 2018-08-12 DIAGNOSIS — Z23 Encounter for immunization: Secondary | ICD-10-CM | POA: Diagnosis not present

## 2018-08-12 DIAGNOSIS — T782XXA Anaphylactic shock, unspecified, initial encounter: Secondary | ICD-10-CM | POA: Diagnosis not present

## 2018-08-12 DIAGNOSIS — D509 Iron deficiency anemia, unspecified: Secondary | ICD-10-CM | POA: Diagnosis not present

## 2018-08-13 DIAGNOSIS — N186 End stage renal disease: Secondary | ICD-10-CM | POA: Diagnosis not present

## 2018-08-13 DIAGNOSIS — T782XXA Anaphylactic shock, unspecified, initial encounter: Secondary | ICD-10-CM | POA: Diagnosis not present

## 2018-08-13 DIAGNOSIS — Z992 Dependence on renal dialysis: Secondary | ICD-10-CM | POA: Diagnosis not present

## 2018-08-13 DIAGNOSIS — D509 Iron deficiency anemia, unspecified: Secondary | ICD-10-CM | POA: Diagnosis not present

## 2018-08-13 DIAGNOSIS — Z23 Encounter for immunization: Secondary | ICD-10-CM | POA: Diagnosis not present

## 2018-08-14 ENCOUNTER — Other Ambulatory Visit: Payer: Self-pay | Admitting: *Deleted

## 2018-08-14 ENCOUNTER — Encounter: Payer: Self-pay | Admitting: *Deleted

## 2018-08-14 DIAGNOSIS — Z23 Encounter for immunization: Secondary | ICD-10-CM | POA: Diagnosis not present

## 2018-08-14 DIAGNOSIS — Z992 Dependence on renal dialysis: Secondary | ICD-10-CM | POA: Diagnosis not present

## 2018-08-14 DIAGNOSIS — N186 End stage renal disease: Secondary | ICD-10-CM | POA: Diagnosis not present

## 2018-08-14 DIAGNOSIS — D509 Iron deficiency anemia, unspecified: Secondary | ICD-10-CM | POA: Diagnosis not present

## 2018-08-14 DIAGNOSIS — T782XXA Anaphylactic shock, unspecified, initial encounter: Secondary | ICD-10-CM | POA: Diagnosis not present

## 2018-08-14 NOTE — Patient Outreach (Signed)
Outreach call to patient for telephonic assessment, spoke with [name], HIPAA verified, [name] reports weight is 357 pounds, [name] has lost some weight and she feels this has helped her feel better, [name] continues peritoneal dialysis and feels this is going well, CBG ranges 140-170's, home health continues working with [name], [name] continues to follow up with dialysis center and having blood work completed. RN CM discussed discharge plan with [name] and [name] feels telephonic assessment is most appropriate for her.    THN CM Care Plan Problem One     Most Recent Value  Care Plan Problem One  [name] high risk for readmission related to multiple co-morbidities (CHF, DM, ESRD, anemia, obesity)  Role Documenting the Problem One  Care Management Coordinator  Care Plan for Problem One  Active  THN Long Term Goal   [name] will verbalize/ demonstrate improved self care related to multiple co-morbidites (HF, DM, ESRD with peritoneal dialysis at home) within 60 days  THN Long Term Goal Start Date  07/03/18  Interventions for Problem One Long Term Goal  RN CM reviewed with [name], she did complete 5 hour test/ scan through dialysis center and [name] states " everything ok, peritoneal dialysis going great"  [name] reports she is to follow up with dialysis center today  [facility] CM Short Term Goal #1   [name] will verbalize CHF action plan/ zones within 30 days  THN CM Short Term Goal #1 Start Date  08/03/18 [goal re-established]  Interventions for Short Term Goal #1  RN CM reviewed and reinforced CHF action plan with empahsis on yellow zone    [facility] CM Care Plan Problem Two     Most Recent Value  Care Plan Problem Two  Knowledge deficit related to diabetes  Role Documenting the Problem Two  Care Management Coordinator  Care Plan for Problem Two  Active  THN CM Short Term Goal #1   [name] will verbalize nutritious food choices/ follow plate method within 30 days  THN CM Short Term Goal #1 Start Date  07/13/18  [facility] CM Short Term Goal #1 Met Date   08/14/18   Interventions for Short Term Goal #2   RN CM reviewed plate method, food choices  THN CM Short Term Goal #2   [name] will obtain glucometer today and check CBG daily and record within 30 days  THN CM Short Term Goal #2 Start Date  07/13/18  [facility] CM Short Term Goal #2 Met Date  07/20/18      PLAN Outreach [name] for telephonic assessment next month  [name] [facility], [place] Coordinator [phone]

## 2018-08-15 DIAGNOSIS — Z992 Dependence on renal dialysis: Secondary | ICD-10-CM | POA: Diagnosis not present

## 2018-08-15 DIAGNOSIS — E662 Morbid (severe) obesity with alveolar hypoventilation: Secondary | ICD-10-CM | POA: Diagnosis not present

## 2018-08-15 DIAGNOSIS — I132 Hypertensive heart and chronic kidney disease with heart failure and with stage 5 chronic kidney disease, or end stage renal disease: Secondary | ICD-10-CM | POA: Diagnosis not present

## 2018-08-15 DIAGNOSIS — E1122 Type 2 diabetes mellitus with diabetic chronic kidney disease: Secondary | ICD-10-CM | POA: Diagnosis not present

## 2018-08-15 DIAGNOSIS — T782XXA Anaphylactic shock, unspecified, initial encounter: Secondary | ICD-10-CM | POA: Diagnosis not present

## 2018-08-15 DIAGNOSIS — I5032 Chronic diastolic (congestive) heart failure: Secondary | ICD-10-CM | POA: Diagnosis not present

## 2018-08-15 DIAGNOSIS — D509 Iron deficiency anemia, unspecified: Secondary | ICD-10-CM | POA: Diagnosis not present

## 2018-08-15 DIAGNOSIS — N186 End stage renal disease: Secondary | ICD-10-CM | POA: Diagnosis not present

## 2018-08-15 DIAGNOSIS — I890 Lymphedema, not elsewhere classified: Secondary | ICD-10-CM | POA: Diagnosis not present

## 2018-08-15 DIAGNOSIS — Z23 Encounter for immunization: Secondary | ICD-10-CM | POA: Diagnosis not present

## 2018-08-16 DIAGNOSIS — Z992 Dependence on renal dialysis: Secondary | ICD-10-CM | POA: Diagnosis not present

## 2018-08-16 DIAGNOSIS — T782XXA Anaphylactic shock, unspecified, initial encounter: Secondary | ICD-10-CM | POA: Diagnosis not present

## 2018-08-16 DIAGNOSIS — Z23 Encounter for immunization: Secondary | ICD-10-CM | POA: Diagnosis not present

## 2018-08-16 DIAGNOSIS — D509 Iron deficiency anemia, unspecified: Secondary | ICD-10-CM | POA: Diagnosis not present

## 2018-08-16 DIAGNOSIS — N186 End stage renal disease: Secondary | ICD-10-CM | POA: Diagnosis not present

## 2018-08-17 DIAGNOSIS — D509 Iron deficiency anemia, unspecified: Secondary | ICD-10-CM | POA: Diagnosis not present

## 2018-08-17 DIAGNOSIS — N186 End stage renal disease: Secondary | ICD-10-CM | POA: Diagnosis not present

## 2018-08-17 DIAGNOSIS — Z23 Encounter for immunization: Secondary | ICD-10-CM | POA: Diagnosis not present

## 2018-08-17 DIAGNOSIS — T782XXA Anaphylactic shock, unspecified, initial encounter: Secondary | ICD-10-CM | POA: Diagnosis not present

## 2018-08-17 DIAGNOSIS — Z992 Dependence on renal dialysis: Secondary | ICD-10-CM | POA: Diagnosis not present

## 2018-08-18 DIAGNOSIS — T782XXA Anaphylactic shock, unspecified, initial encounter: Secondary | ICD-10-CM | POA: Diagnosis not present

## 2018-08-18 DIAGNOSIS — Z23 Encounter for immunization: Secondary | ICD-10-CM | POA: Diagnosis not present

## 2018-08-18 DIAGNOSIS — Z992 Dependence on renal dialysis: Secondary | ICD-10-CM | POA: Diagnosis not present

## 2018-08-18 DIAGNOSIS — N186 End stage renal disease: Secondary | ICD-10-CM | POA: Diagnosis not present

## 2018-08-18 DIAGNOSIS — D509 Iron deficiency anemia, unspecified: Secondary | ICD-10-CM | POA: Diagnosis not present

## 2018-08-19 DIAGNOSIS — N186 End stage renal disease: Secondary | ICD-10-CM | POA: Diagnosis not present

## 2018-08-19 DIAGNOSIS — Z992 Dependence on renal dialysis: Secondary | ICD-10-CM | POA: Diagnosis not present

## 2018-08-19 DIAGNOSIS — T782XXA Anaphylactic shock, unspecified, initial encounter: Secondary | ICD-10-CM | POA: Diagnosis not present

## 2018-08-19 DIAGNOSIS — Z23 Encounter for immunization: Secondary | ICD-10-CM | POA: Diagnosis not present

## 2018-08-19 DIAGNOSIS — D509 Iron deficiency anemia, unspecified: Secondary | ICD-10-CM | POA: Diagnosis not present

## 2018-08-20 DIAGNOSIS — Z992 Dependence on renal dialysis: Secondary | ICD-10-CM | POA: Diagnosis not present

## 2018-08-20 DIAGNOSIS — N2581 Secondary hyperparathyroidism of renal origin: Secondary | ICD-10-CM | POA: Diagnosis not present

## 2018-08-20 DIAGNOSIS — D509 Iron deficiency anemia, unspecified: Secondary | ICD-10-CM | POA: Diagnosis not present

## 2018-08-20 DIAGNOSIS — N186 End stage renal disease: Secondary | ICD-10-CM | POA: Diagnosis not present

## 2018-08-21 DIAGNOSIS — D509 Iron deficiency anemia, unspecified: Secondary | ICD-10-CM | POA: Diagnosis not present

## 2018-08-21 DIAGNOSIS — Z992 Dependence on renal dialysis: Secondary | ICD-10-CM | POA: Diagnosis not present

## 2018-08-21 DIAGNOSIS — N2581 Secondary hyperparathyroidism of renal origin: Secondary | ICD-10-CM | POA: Diagnosis not present

## 2018-08-21 DIAGNOSIS — N186 End stage renal disease: Secondary | ICD-10-CM | POA: Diagnosis not present

## 2018-08-22 DIAGNOSIS — D509 Iron deficiency anemia, unspecified: Secondary | ICD-10-CM | POA: Diagnosis not present

## 2018-08-22 DIAGNOSIS — Z992 Dependence on renal dialysis: Secondary | ICD-10-CM | POA: Diagnosis not present

## 2018-08-22 DIAGNOSIS — N2581 Secondary hyperparathyroidism of renal origin: Secondary | ICD-10-CM | POA: Diagnosis not present

## 2018-08-22 DIAGNOSIS — N186 End stage renal disease: Secondary | ICD-10-CM | POA: Diagnosis not present

## 2018-08-23 DIAGNOSIS — N186 End stage renal disease: Secondary | ICD-10-CM | POA: Diagnosis not present

## 2018-08-23 DIAGNOSIS — N2581 Secondary hyperparathyroidism of renal origin: Secondary | ICD-10-CM | POA: Diagnosis not present

## 2018-08-23 DIAGNOSIS — Z992 Dependence on renal dialysis: Secondary | ICD-10-CM | POA: Diagnosis not present

## 2018-08-23 DIAGNOSIS — D509 Iron deficiency anemia, unspecified: Secondary | ICD-10-CM | POA: Diagnosis not present

## 2018-08-24 DIAGNOSIS — Z992 Dependence on renal dialysis: Secondary | ICD-10-CM | POA: Diagnosis not present

## 2018-08-24 DIAGNOSIS — D509 Iron deficiency anemia, unspecified: Secondary | ICD-10-CM | POA: Diagnosis not present

## 2018-08-24 DIAGNOSIS — N186 End stage renal disease: Secondary | ICD-10-CM | POA: Diagnosis not present

## 2018-08-24 DIAGNOSIS — N2581 Secondary hyperparathyroidism of renal origin: Secondary | ICD-10-CM | POA: Diagnosis not present

## 2018-08-25 DIAGNOSIS — D509 Iron deficiency anemia, unspecified: Secondary | ICD-10-CM | POA: Diagnosis not present

## 2018-08-25 DIAGNOSIS — Z992 Dependence on renal dialysis: Secondary | ICD-10-CM | POA: Diagnosis not present

## 2018-08-25 DIAGNOSIS — N2581 Secondary hyperparathyroidism of renal origin: Secondary | ICD-10-CM | POA: Diagnosis not present

## 2018-08-25 DIAGNOSIS — N186 End stage renal disease: Secondary | ICD-10-CM | POA: Diagnosis not present

## 2018-08-26 DIAGNOSIS — Z992 Dependence on renal dialysis: Secondary | ICD-10-CM | POA: Diagnosis not present

## 2018-08-26 DIAGNOSIS — N186 End stage renal disease: Secondary | ICD-10-CM | POA: Diagnosis not present

## 2018-08-26 DIAGNOSIS — N2581 Secondary hyperparathyroidism of renal origin: Secondary | ICD-10-CM | POA: Diagnosis not present

## 2018-08-26 DIAGNOSIS — D509 Iron deficiency anemia, unspecified: Secondary | ICD-10-CM | POA: Diagnosis not present

## 2018-08-27 DIAGNOSIS — N186 End stage renal disease: Secondary | ICD-10-CM | POA: Diagnosis not present

## 2018-08-27 DIAGNOSIS — D509 Iron deficiency anemia, unspecified: Secondary | ICD-10-CM | POA: Diagnosis not present

## 2018-08-27 DIAGNOSIS — N2581 Secondary hyperparathyroidism of renal origin: Secondary | ICD-10-CM | POA: Diagnosis not present

## 2018-08-27 DIAGNOSIS — Z992 Dependence on renal dialysis: Secondary | ICD-10-CM | POA: Diagnosis not present

## 2018-08-28 DIAGNOSIS — I890 Lymphedema, not elsewhere classified: Secondary | ICD-10-CM | POA: Diagnosis not present

## 2018-08-28 DIAGNOSIS — N2581 Secondary hyperparathyroidism of renal origin: Secondary | ICD-10-CM | POA: Diagnosis not present

## 2018-08-28 DIAGNOSIS — I5032 Chronic diastolic (congestive) heart failure: Secondary | ICD-10-CM | POA: Diagnosis not present

## 2018-08-28 DIAGNOSIS — E662 Morbid (severe) obesity with alveolar hypoventilation: Secondary | ICD-10-CM | POA: Diagnosis not present

## 2018-08-28 DIAGNOSIS — I132 Hypertensive heart and chronic kidney disease with heart failure and with stage 5 chronic kidney disease, or end stage renal disease: Secondary | ICD-10-CM | POA: Diagnosis not present

## 2018-08-28 DIAGNOSIS — Z992 Dependence on renal dialysis: Secondary | ICD-10-CM | POA: Diagnosis not present

## 2018-08-28 DIAGNOSIS — D509 Iron deficiency anemia, unspecified: Secondary | ICD-10-CM | POA: Diagnosis not present

## 2018-08-28 DIAGNOSIS — E1122 Type 2 diabetes mellitus with diabetic chronic kidney disease: Secondary | ICD-10-CM | POA: Diagnosis not present

## 2018-08-28 DIAGNOSIS — N186 End stage renal disease: Secondary | ICD-10-CM | POA: Diagnosis not present

## 2018-08-29 DIAGNOSIS — Z992 Dependence on renal dialysis: Secondary | ICD-10-CM | POA: Diagnosis not present

## 2018-08-29 DIAGNOSIS — N186 End stage renal disease: Secondary | ICD-10-CM | POA: Diagnosis not present

## 2018-08-29 DIAGNOSIS — N2581 Secondary hyperparathyroidism of renal origin: Secondary | ICD-10-CM | POA: Diagnosis not present

## 2018-08-29 DIAGNOSIS — D509 Iron deficiency anemia, unspecified: Secondary | ICD-10-CM | POA: Diagnosis not present

## 2018-08-30 DIAGNOSIS — J449 Chronic obstructive pulmonary disease, unspecified: Secondary | ICD-10-CM | POA: Diagnosis not present

## 2018-08-30 DIAGNOSIS — Z992 Dependence on renal dialysis: Secondary | ICD-10-CM | POA: Diagnosis not present

## 2018-08-30 DIAGNOSIS — D509 Iron deficiency anemia, unspecified: Secondary | ICD-10-CM | POA: Diagnosis not present

## 2018-08-30 DIAGNOSIS — E1121 Type 2 diabetes mellitus with diabetic nephropathy: Secondary | ICD-10-CM | POA: Diagnosis not present

## 2018-08-30 DIAGNOSIS — N185 Chronic kidney disease, stage 5: Secondary | ICD-10-CM | POA: Diagnosis not present

## 2018-08-30 DIAGNOSIS — N186 End stage renal disease: Secondary | ICD-10-CM | POA: Diagnosis not present

## 2018-08-30 DIAGNOSIS — I120 Hypertensive chronic kidney disease with stage 5 chronic kidney disease or end stage renal disease: Secondary | ICD-10-CM | POA: Diagnosis not present

## 2018-08-30 DIAGNOSIS — N2581 Secondary hyperparathyroidism of renal origin: Secondary | ICD-10-CM | POA: Diagnosis not present

## 2018-08-31 DIAGNOSIS — D509 Iron deficiency anemia, unspecified: Secondary | ICD-10-CM | POA: Diagnosis not present

## 2018-08-31 DIAGNOSIS — N186 End stage renal disease: Secondary | ICD-10-CM | POA: Diagnosis not present

## 2018-08-31 DIAGNOSIS — N2581 Secondary hyperparathyroidism of renal origin: Secondary | ICD-10-CM | POA: Diagnosis not present

## 2018-08-31 DIAGNOSIS — Z992 Dependence on renal dialysis: Secondary | ICD-10-CM | POA: Diagnosis not present

## 2018-09-01 DIAGNOSIS — N2581 Secondary hyperparathyroidism of renal origin: Secondary | ICD-10-CM | POA: Diagnosis not present

## 2018-09-01 DIAGNOSIS — N186 End stage renal disease: Secondary | ICD-10-CM | POA: Diagnosis not present

## 2018-09-01 DIAGNOSIS — Z992 Dependence on renal dialysis: Secondary | ICD-10-CM | POA: Diagnosis not present

## 2018-09-01 DIAGNOSIS — D509 Iron deficiency anemia, unspecified: Secondary | ICD-10-CM | POA: Diagnosis not present

## 2018-09-02 DIAGNOSIS — N186 End stage renal disease: Secondary | ICD-10-CM | POA: Diagnosis not present

## 2018-09-02 DIAGNOSIS — N2581 Secondary hyperparathyroidism of renal origin: Secondary | ICD-10-CM | POA: Diagnosis not present

## 2018-09-02 DIAGNOSIS — Z992 Dependence on renal dialysis: Secondary | ICD-10-CM | POA: Diagnosis not present

## 2018-09-02 DIAGNOSIS — D509 Iron deficiency anemia, unspecified: Secondary | ICD-10-CM | POA: Diagnosis not present

## 2018-09-03 DIAGNOSIS — N2581 Secondary hyperparathyroidism of renal origin: Secondary | ICD-10-CM | POA: Diagnosis not present

## 2018-09-03 DIAGNOSIS — D509 Iron deficiency anemia, unspecified: Secondary | ICD-10-CM | POA: Diagnosis not present

## 2018-09-03 DIAGNOSIS — Z992 Dependence on renal dialysis: Secondary | ICD-10-CM | POA: Diagnosis not present

## 2018-09-03 DIAGNOSIS — N186 End stage renal disease: Secondary | ICD-10-CM | POA: Diagnosis not present

## 2018-09-04 DIAGNOSIS — D509 Iron deficiency anemia, unspecified: Secondary | ICD-10-CM | POA: Diagnosis not present

## 2018-09-04 DIAGNOSIS — N2581 Secondary hyperparathyroidism of renal origin: Secondary | ICD-10-CM | POA: Diagnosis not present

## 2018-09-04 DIAGNOSIS — N186 End stage renal disease: Secondary | ICD-10-CM | POA: Diagnosis not present

## 2018-09-04 DIAGNOSIS — Z992 Dependence on renal dialysis: Secondary | ICD-10-CM | POA: Diagnosis not present

## 2018-09-05 DIAGNOSIS — Z992 Dependence on renal dialysis: Secondary | ICD-10-CM | POA: Diagnosis not present

## 2018-09-05 DIAGNOSIS — N2581 Secondary hyperparathyroidism of renal origin: Secondary | ICD-10-CM | POA: Diagnosis not present

## 2018-09-05 DIAGNOSIS — N186 End stage renal disease: Secondary | ICD-10-CM | POA: Diagnosis not present

## 2018-09-05 DIAGNOSIS — D509 Iron deficiency anemia, unspecified: Secondary | ICD-10-CM | POA: Diagnosis not present

## 2018-09-06 DIAGNOSIS — N2581 Secondary hyperparathyroidism of renal origin: Secondary | ICD-10-CM | POA: Diagnosis not present

## 2018-09-06 DIAGNOSIS — Z992 Dependence on renal dialysis: Secondary | ICD-10-CM | POA: Diagnosis not present

## 2018-09-06 DIAGNOSIS — D509 Iron deficiency anemia, unspecified: Secondary | ICD-10-CM | POA: Diagnosis not present

## 2018-09-06 DIAGNOSIS — N186 End stage renal disease: Secondary | ICD-10-CM | POA: Diagnosis not present

## 2018-09-07 DIAGNOSIS — D509 Iron deficiency anemia, unspecified: Secondary | ICD-10-CM | POA: Diagnosis not present

## 2018-09-07 DIAGNOSIS — Z992 Dependence on renal dialysis: Secondary | ICD-10-CM | POA: Diagnosis not present

## 2018-09-07 DIAGNOSIS — N2581 Secondary hyperparathyroidism of renal origin: Secondary | ICD-10-CM | POA: Diagnosis not present

## 2018-09-07 DIAGNOSIS — N186 End stage renal disease: Secondary | ICD-10-CM | POA: Diagnosis not present

## 2018-09-08 DIAGNOSIS — Z992 Dependence on renal dialysis: Secondary | ICD-10-CM | POA: Diagnosis not present

## 2018-09-08 DIAGNOSIS — N2581 Secondary hyperparathyroidism of renal origin: Secondary | ICD-10-CM | POA: Diagnosis not present

## 2018-09-08 DIAGNOSIS — N186 End stage renal disease: Secondary | ICD-10-CM | POA: Diagnosis not present

## 2018-09-08 DIAGNOSIS — D509 Iron deficiency anemia, unspecified: Secondary | ICD-10-CM | POA: Diagnosis not present

## 2018-09-09 DIAGNOSIS — D509 Iron deficiency anemia, unspecified: Secondary | ICD-10-CM | POA: Diagnosis not present

## 2018-09-09 DIAGNOSIS — N2581 Secondary hyperparathyroidism of renal origin: Secondary | ICD-10-CM | POA: Diagnosis not present

## 2018-09-09 DIAGNOSIS — N186 End stage renal disease: Secondary | ICD-10-CM | POA: Diagnosis not present

## 2018-09-09 DIAGNOSIS — Z992 Dependence on renal dialysis: Secondary | ICD-10-CM | POA: Diagnosis not present

## 2018-09-10 DIAGNOSIS — Z992 Dependence on renal dialysis: Secondary | ICD-10-CM | POA: Diagnosis not present

## 2018-09-10 DIAGNOSIS — N186 End stage renal disease: Secondary | ICD-10-CM | POA: Diagnosis not present

## 2018-09-10 DIAGNOSIS — D509 Iron deficiency anemia, unspecified: Secondary | ICD-10-CM | POA: Diagnosis not present

## 2018-09-10 DIAGNOSIS — N2581 Secondary hyperparathyroidism of renal origin: Secondary | ICD-10-CM | POA: Diagnosis not present

## 2018-09-11 ENCOUNTER — Ambulatory Visit: Payer: Self-pay | Admitting: *Deleted

## 2018-09-11 ENCOUNTER — Other Ambulatory Visit: Payer: Self-pay

## 2018-09-11 DIAGNOSIS — Z992 Dependence on renal dialysis: Secondary | ICD-10-CM | POA: Diagnosis not present

## 2018-09-11 DIAGNOSIS — N2581 Secondary hyperparathyroidism of renal origin: Secondary | ICD-10-CM | POA: Diagnosis not present

## 2018-09-11 DIAGNOSIS — D509 Iron deficiency anemia, unspecified: Secondary | ICD-10-CM | POA: Diagnosis not present

## 2018-09-11 DIAGNOSIS — N186 End stage renal disease: Secondary | ICD-10-CM | POA: Diagnosis not present

## 2018-09-11 NOTE — Patient Outreach (Signed)
[place] [facility]) Care Management  09/11/2018  [name] 1966-10-01 [id number]   Outreach call placed due to unavailability of assigned RNCM. Discussed plan for telephonic assessment and discharge. [name] verified that she would be available to discuss her care and complete a telephonic assessment on 09/18/18. Reported no urgent needs or concerns at the time of the call. Encouraged to notify this RNCM with questions or concerns as needed.   PLAN Will complete telephonic assessment on 09/18/18.   [place] [phone]

## 2018-09-12 DIAGNOSIS — Z992 Dependence on renal dialysis: Secondary | ICD-10-CM | POA: Diagnosis not present

## 2018-09-12 DIAGNOSIS — N186 End stage renal disease: Secondary | ICD-10-CM | POA: Diagnosis not present

## 2018-09-12 DIAGNOSIS — D509 Iron deficiency anemia, unspecified: Secondary | ICD-10-CM | POA: Diagnosis not present

## 2018-09-12 DIAGNOSIS — N2581 Secondary hyperparathyroidism of renal origin: Secondary | ICD-10-CM | POA: Diagnosis not present

## 2018-09-13 DIAGNOSIS — Z992 Dependence on renal dialysis: Secondary | ICD-10-CM | POA: Diagnosis not present

## 2018-09-13 DIAGNOSIS — D509 Iron deficiency anemia, unspecified: Secondary | ICD-10-CM | POA: Diagnosis not present

## 2018-09-13 DIAGNOSIS — N186 End stage renal disease: Secondary | ICD-10-CM | POA: Diagnosis not present

## 2018-09-13 DIAGNOSIS — N2581 Secondary hyperparathyroidism of renal origin: Secondary | ICD-10-CM | POA: Diagnosis not present

## 2018-09-14 DIAGNOSIS — D509 Iron deficiency anemia, unspecified: Secondary | ICD-10-CM | POA: Diagnosis not present

## 2018-09-14 DIAGNOSIS — Z992 Dependence on renal dialysis: Secondary | ICD-10-CM | POA: Diagnosis not present

## 2018-09-14 DIAGNOSIS — N2581 Secondary hyperparathyroidism of renal origin: Secondary | ICD-10-CM | POA: Diagnosis not present

## 2018-09-14 DIAGNOSIS — N186 End stage renal disease: Secondary | ICD-10-CM | POA: Diagnosis not present

## 2018-09-15 DIAGNOSIS — Z992 Dependence on renal dialysis: Secondary | ICD-10-CM | POA: Diagnosis not present

## 2018-09-15 DIAGNOSIS — N2581 Secondary hyperparathyroidism of renal origin: Secondary | ICD-10-CM | POA: Diagnosis not present

## 2018-09-15 DIAGNOSIS — N186 End stage renal disease: Secondary | ICD-10-CM | POA: Diagnosis not present

## 2018-09-15 DIAGNOSIS — Z79899 Other long term (current) drug therapy: Secondary | ICD-10-CM | POA: Diagnosis not present

## 2018-09-15 DIAGNOSIS — D509 Iron deficiency anemia, unspecified: Secondary | ICD-10-CM | POA: Diagnosis not present

## 2018-09-16 DIAGNOSIS — D509 Iron deficiency anemia, unspecified: Secondary | ICD-10-CM | POA: Diagnosis not present

## 2018-09-16 DIAGNOSIS — Z992 Dependence on renal dialysis: Secondary | ICD-10-CM | POA: Diagnosis not present

## 2018-09-16 DIAGNOSIS — N2581 Secondary hyperparathyroidism of renal origin: Secondary | ICD-10-CM | POA: Diagnosis not present

## 2018-09-16 DIAGNOSIS — N186 End stage renal disease: Secondary | ICD-10-CM | POA: Diagnosis not present

## 2018-09-17 DIAGNOSIS — D509 Iron deficiency anemia, unspecified: Secondary | ICD-10-CM | POA: Diagnosis not present

## 2018-09-17 DIAGNOSIS — N186 End stage renal disease: Secondary | ICD-10-CM | POA: Diagnosis not present

## 2018-09-17 DIAGNOSIS — Z992 Dependence on renal dialysis: Secondary | ICD-10-CM | POA: Diagnosis not present

## 2018-09-17 DIAGNOSIS — N2581 Secondary hyperparathyroidism of renal origin: Secondary | ICD-10-CM | POA: Diagnosis not present

## 2018-09-18 ENCOUNTER — Other Ambulatory Visit: Payer: Self-pay

## 2018-09-18 DIAGNOSIS — N186 End stage renal disease: Secondary | ICD-10-CM | POA: Diagnosis not present

## 2018-09-18 DIAGNOSIS — N2581 Secondary hyperparathyroidism of renal origin: Secondary | ICD-10-CM | POA: Diagnosis not present

## 2018-09-18 DIAGNOSIS — D509 Iron deficiency anemia, unspecified: Secondary | ICD-10-CM | POA: Diagnosis not present

## 2018-09-18 DIAGNOSIS — Z992 Dependence on renal dialysis: Secondary | ICD-10-CM | POA: Diagnosis not present

## 2018-09-18 NOTE — Patient Outreach (Signed)
[facility]) Care Management   09/18/2018  [name] 11-03-1966 [id number]  [name] is an 52 y.o. female     ROS  Physical Exam Encounter Medications:   Outpatient Encounter Medications as of 09/18/2018  Medication Sig Note  . acetaminophen (TYLENOL) 325 MG tablet Take 650 mg by mouth every 6 (six) hours as needed for moderate pain or fever.    Marland Kitchen albuterol (PROVENTIL) (2.5 MG/3ML) 0.083% nebulizer solution Take 2.5 mg by nebulization every 6 (six) hours as needed for wheezing or shortness of breath.   . ALPRAZolam (XANAX) 0.5 MG tablet Take 0.5 mg by mouth 3 (three) times daily as needed for anxiety or sleep.    Marland Kitchen amLODipine (NORVASC) 10 MG tablet Take 0.5 tablets (5 mg total) by mouth daily.   . blood glucose meter kit and supplies KIT Dispense based on patient and insurance preference. Use up to four times daily as directed. (FOR ICD-9 250.00, 250.01).   Marland Kitchen diphenhydrAMINE (BENADRYL) 25 mg capsule Take 25 mg by mouth every 6 (six) hours as needed for allergies.   Marland Kitchen insulin detemir (LEVEMIR) 100 UNIT/ML injection Inject 10 Units into the skin daily. 07/13/2018: vial  . lanthanum (FOSRENOL) 500 MG chewable tablet Chew 1,000-1,500 mg by mouth 5 (five) times daily. Patient takes 3 tablets by mouth three times a day with meals and 2 tablets by mouth twice a day with snacks   . multivitamin (RENA-VIT) TABS tablet Take 1 tablet by mouth daily.   . Oxycodone HCl 10 MG TABS Take 1 tablet (10 mg total) by mouth 4 (four) times daily as needed (pain).   . potassium chloride 20 MEQ TBCR Take 40 mEq by mouth daily.   . [DISCONTINUED] insulin lispro (HUMALOG) 100 UNIT/ML injection Inject 60 Units into the skin 2 (two) times daily.      No facility-administered encounter medications on file as of 09/18/2018.     Functional Status:   In your present state of health, do you have any difficulty performing the following activities: 07/13/2018 06/27/2018  Hearing? N N   Vision? N N  Difficulty concentrating or making decisions? N N  Walking or climbing stairs? Y Y  Dressing or bathing? Y Y  Doing errands, shopping? [name]  Preparing Food and eating ? N -  Using the Toilet? N -  In the past six months, have you accidently leaked urine? N -  Comment pt barely has any urine output  approximately 20 ounces in 48 hours -  Do you have problems with loss of bowel control? N -  Managing your Medications? N -  Managing your Finances? N -  Housekeeping or managing your Housekeeping? Y -  Some recent data might be hidden    Fall/Depression Screening:    Fall Risk  07/13/2018  Falls in the past year? Yes  Number falls in past yr: 1  Injury with Fall? Yes  Risk Factor Category  High Fall Risk  Risk for fall due to : History of fall(s);Medication side effect  Follow up Education provided;Falls evaluation completed;Falls prevention discussed   PHQ 2/9 Scores 07/13/2018  PHQ - 2 Score 0    Telephonic outreach for case closure. [name] reported doing well. Reported compliance with medications, blood glucose monitoring and fluid restrictions. Continues peritoneal dialysis and is being telemonitored by [facility]. Activity level remains limited due to difficulty ambulating, but she stated family members were available to assist in the home as needed. No  falls reported. [name] denied urgent concerns and declined need for further care management outreach. Member agreeable to discharge and case closure due to goals being met. Informed of option to notify PCP or contact [facility] staff if medical needs change and [facility] Care Management services are needed.   [place] [phone]

## 2018-09-19 DIAGNOSIS — N186 End stage renal disease: Secondary | ICD-10-CM | POA: Diagnosis not present

## 2018-09-19 DIAGNOSIS — D509 Iron deficiency anemia, unspecified: Secondary | ICD-10-CM | POA: Diagnosis not present

## 2018-09-19 DIAGNOSIS — D631 Anemia in chronic kidney disease: Secondary | ICD-10-CM | POA: Diagnosis not present

## 2018-09-19 DIAGNOSIS — Z23 Encounter for immunization: Secondary | ICD-10-CM | POA: Diagnosis not present

## 2018-09-19 DIAGNOSIS — N2581 Secondary hyperparathyroidism of renal origin: Secondary | ICD-10-CM | POA: Diagnosis not present

## 2018-09-19 DIAGNOSIS — Z992 Dependence on renal dialysis: Secondary | ICD-10-CM | POA: Diagnosis not present

## 2018-09-20 DIAGNOSIS — Z992 Dependence on renal dialysis: Secondary | ICD-10-CM | POA: Diagnosis not present

## 2018-09-20 DIAGNOSIS — D509 Iron deficiency anemia, unspecified: Secondary | ICD-10-CM | POA: Diagnosis not present

## 2018-09-20 DIAGNOSIS — N2581 Secondary hyperparathyroidism of renal origin: Secondary | ICD-10-CM | POA: Diagnosis not present

## 2018-09-20 DIAGNOSIS — N186 End stage renal disease: Secondary | ICD-10-CM | POA: Diagnosis not present

## 2018-09-20 DIAGNOSIS — D631 Anemia in chronic kidney disease: Secondary | ICD-10-CM | POA: Diagnosis not present

## 2018-09-20 DIAGNOSIS — Z23 Encounter for immunization: Secondary | ICD-10-CM | POA: Diagnosis not present

## 2018-09-21 DIAGNOSIS — Z23 Encounter for immunization: Secondary | ICD-10-CM | POA: Diagnosis not present

## 2018-09-21 DIAGNOSIS — D631 Anemia in chronic kidney disease: Secondary | ICD-10-CM | POA: Diagnosis not present

## 2018-09-21 DIAGNOSIS — D509 Iron deficiency anemia, unspecified: Secondary | ICD-10-CM | POA: Diagnosis not present

## 2018-09-21 DIAGNOSIS — Z992 Dependence on renal dialysis: Secondary | ICD-10-CM | POA: Diagnosis not present

## 2018-09-21 DIAGNOSIS — N2581 Secondary hyperparathyroidism of renal origin: Secondary | ICD-10-CM | POA: Diagnosis not present

## 2018-09-21 DIAGNOSIS — N186 End stage renal disease: Secondary | ICD-10-CM | POA: Diagnosis not present

## 2018-09-22 DIAGNOSIS — D631 Anemia in chronic kidney disease: Secondary | ICD-10-CM | POA: Diagnosis not present

## 2018-09-22 DIAGNOSIS — D509 Iron deficiency anemia, unspecified: Secondary | ICD-10-CM | POA: Diagnosis not present

## 2018-09-22 DIAGNOSIS — Z23 Encounter for immunization: Secondary | ICD-10-CM | POA: Diagnosis not present

## 2018-09-22 DIAGNOSIS — N186 End stage renal disease: Secondary | ICD-10-CM | POA: Diagnosis not present

## 2018-09-22 DIAGNOSIS — Z992 Dependence on renal dialysis: Secondary | ICD-10-CM | POA: Diagnosis not present

## 2018-09-22 DIAGNOSIS — N2581 Secondary hyperparathyroidism of renal origin: Secondary | ICD-10-CM | POA: Diagnosis not present

## 2018-09-23 DIAGNOSIS — D509 Iron deficiency anemia, unspecified: Secondary | ICD-10-CM | POA: Diagnosis not present

## 2018-09-23 DIAGNOSIS — N2581 Secondary hyperparathyroidism of renal origin: Secondary | ICD-10-CM | POA: Diagnosis not present

## 2018-09-23 DIAGNOSIS — Z992 Dependence on renal dialysis: Secondary | ICD-10-CM | POA: Diagnosis not present

## 2018-09-23 DIAGNOSIS — Z23 Encounter for immunization: Secondary | ICD-10-CM | POA: Diagnosis not present

## 2018-09-23 DIAGNOSIS — D631 Anemia in chronic kidney disease: Secondary | ICD-10-CM | POA: Diagnosis not present

## 2018-09-23 DIAGNOSIS — N186 End stage renal disease: Secondary | ICD-10-CM | POA: Diagnosis not present

## 2018-09-24 DIAGNOSIS — N186 End stage renal disease: Secondary | ICD-10-CM | POA: Diagnosis not present

## 2018-09-24 DIAGNOSIS — D509 Iron deficiency anemia, unspecified: Secondary | ICD-10-CM | POA: Diagnosis not present

## 2018-09-24 DIAGNOSIS — Z992 Dependence on renal dialysis: Secondary | ICD-10-CM | POA: Diagnosis not present

## 2018-09-24 DIAGNOSIS — D631 Anemia in chronic kidney disease: Secondary | ICD-10-CM | POA: Diagnosis not present

## 2018-09-24 DIAGNOSIS — Z23 Encounter for immunization: Secondary | ICD-10-CM | POA: Diagnosis not present

## 2018-09-24 DIAGNOSIS — N2581 Secondary hyperparathyroidism of renal origin: Secondary | ICD-10-CM | POA: Diagnosis not present

## 2018-09-25 DIAGNOSIS — D509 Iron deficiency anemia, unspecified: Secondary | ICD-10-CM | POA: Diagnosis not present

## 2018-09-25 DIAGNOSIS — N186 End stage renal disease: Secondary | ICD-10-CM | POA: Diagnosis not present

## 2018-09-25 DIAGNOSIS — Z992 Dependence on renal dialysis: Secondary | ICD-10-CM | POA: Diagnosis not present

## 2018-09-25 DIAGNOSIS — D631 Anemia in chronic kidney disease: Secondary | ICD-10-CM | POA: Diagnosis not present

## 2018-09-25 DIAGNOSIS — Z23 Encounter for immunization: Secondary | ICD-10-CM | POA: Diagnosis not present

## 2018-09-25 DIAGNOSIS — N2581 Secondary hyperparathyroidism of renal origin: Secondary | ICD-10-CM | POA: Diagnosis not present

## 2018-09-26 DIAGNOSIS — N2581 Secondary hyperparathyroidism of renal origin: Secondary | ICD-10-CM | POA: Diagnosis not present

## 2018-09-26 DIAGNOSIS — D631 Anemia in chronic kidney disease: Secondary | ICD-10-CM | POA: Diagnosis not present

## 2018-09-26 DIAGNOSIS — D509 Iron deficiency anemia, unspecified: Secondary | ICD-10-CM | POA: Diagnosis not present

## 2018-09-26 DIAGNOSIS — N186 End stage renal disease: Secondary | ICD-10-CM | POA: Diagnosis not present

## 2018-09-26 DIAGNOSIS — Z992 Dependence on renal dialysis: Secondary | ICD-10-CM | POA: Diagnosis not present

## 2018-09-26 DIAGNOSIS — Z23 Encounter for immunization: Secondary | ICD-10-CM | POA: Diagnosis not present

## 2018-09-27 DIAGNOSIS — Z992 Dependence on renal dialysis: Secondary | ICD-10-CM | POA: Diagnosis not present

## 2018-09-27 DIAGNOSIS — Z23 Encounter for immunization: Secondary | ICD-10-CM | POA: Diagnosis not present

## 2018-09-27 DIAGNOSIS — N2581 Secondary hyperparathyroidism of renal origin: Secondary | ICD-10-CM | POA: Diagnosis not present

## 2018-09-27 DIAGNOSIS — D631 Anemia in chronic kidney disease: Secondary | ICD-10-CM | POA: Diagnosis not present

## 2018-09-27 DIAGNOSIS — D509 Iron deficiency anemia, unspecified: Secondary | ICD-10-CM | POA: Diagnosis not present

## 2018-09-27 DIAGNOSIS — N186 End stage renal disease: Secondary | ICD-10-CM | POA: Diagnosis not present

## 2018-09-28 DIAGNOSIS — Z23 Encounter for immunization: Secondary | ICD-10-CM | POA: Diagnosis not present

## 2018-09-28 DIAGNOSIS — N2581 Secondary hyperparathyroidism of renal origin: Secondary | ICD-10-CM | POA: Diagnosis not present

## 2018-09-28 DIAGNOSIS — Z992 Dependence on renal dialysis: Secondary | ICD-10-CM | POA: Diagnosis not present

## 2018-09-28 DIAGNOSIS — D509 Iron deficiency anemia, unspecified: Secondary | ICD-10-CM | POA: Diagnosis not present

## 2018-09-28 DIAGNOSIS — N186 End stage renal disease: Secondary | ICD-10-CM | POA: Diagnosis not present

## 2018-09-28 DIAGNOSIS — D631 Anemia in chronic kidney disease: Secondary | ICD-10-CM | POA: Diagnosis not present

## 2018-09-29 DIAGNOSIS — Z23 Encounter for immunization: Secondary | ICD-10-CM | POA: Diagnosis not present

## 2018-09-29 DIAGNOSIS — Z992 Dependence on renal dialysis: Secondary | ICD-10-CM | POA: Diagnosis not present

## 2018-09-29 DIAGNOSIS — D631 Anemia in chronic kidney disease: Secondary | ICD-10-CM | POA: Diagnosis not present

## 2018-09-29 DIAGNOSIS — N2581 Secondary hyperparathyroidism of renal origin: Secondary | ICD-10-CM | POA: Diagnosis not present

## 2018-09-29 DIAGNOSIS — D509 Iron deficiency anemia, unspecified: Secondary | ICD-10-CM | POA: Diagnosis not present

## 2018-09-29 DIAGNOSIS — N186 End stage renal disease: Secondary | ICD-10-CM | POA: Diagnosis not present

## 2018-09-30 DIAGNOSIS — D631 Anemia in chronic kidney disease: Secondary | ICD-10-CM | POA: Diagnosis not present

## 2018-09-30 DIAGNOSIS — N2581 Secondary hyperparathyroidism of renal origin: Secondary | ICD-10-CM | POA: Diagnosis not present

## 2018-09-30 DIAGNOSIS — Z992 Dependence on renal dialysis: Secondary | ICD-10-CM | POA: Diagnosis not present

## 2018-09-30 DIAGNOSIS — Z23 Encounter for immunization: Secondary | ICD-10-CM | POA: Diagnosis not present

## 2018-09-30 DIAGNOSIS — N186 End stage renal disease: Secondary | ICD-10-CM | POA: Diagnosis not present

## 2018-09-30 DIAGNOSIS — D509 Iron deficiency anemia, unspecified: Secondary | ICD-10-CM | POA: Diagnosis not present

## 2018-10-01 DIAGNOSIS — Z23 Encounter for immunization: Secondary | ICD-10-CM | POA: Diagnosis not present

## 2018-10-01 DIAGNOSIS — Z992 Dependence on renal dialysis: Secondary | ICD-10-CM | POA: Diagnosis not present

## 2018-10-01 DIAGNOSIS — N186 End stage renal disease: Secondary | ICD-10-CM | POA: Diagnosis not present

## 2018-10-01 DIAGNOSIS — D631 Anemia in chronic kidney disease: Secondary | ICD-10-CM | POA: Diagnosis not present

## 2018-10-01 DIAGNOSIS — N2581 Secondary hyperparathyroidism of renal origin: Secondary | ICD-10-CM | POA: Diagnosis not present

## 2018-10-01 DIAGNOSIS — D509 Iron deficiency anemia, unspecified: Secondary | ICD-10-CM | POA: Diagnosis not present

## 2018-10-02 DIAGNOSIS — N2581 Secondary hyperparathyroidism of renal origin: Secondary | ICD-10-CM | POA: Diagnosis not present

## 2018-10-02 DIAGNOSIS — Z992 Dependence on renal dialysis: Secondary | ICD-10-CM | POA: Diagnosis not present

## 2018-10-02 DIAGNOSIS — N186 End stage renal disease: Secondary | ICD-10-CM | POA: Diagnosis not present

## 2018-10-02 DIAGNOSIS — D509 Iron deficiency anemia, unspecified: Secondary | ICD-10-CM | POA: Diagnosis not present

## 2018-10-02 DIAGNOSIS — D631 Anemia in chronic kidney disease: Secondary | ICD-10-CM | POA: Diagnosis not present

## 2018-10-02 DIAGNOSIS — Z23 Encounter for immunization: Secondary | ICD-10-CM | POA: Diagnosis not present

## 2018-10-03 DIAGNOSIS — Z992 Dependence on renal dialysis: Secondary | ICD-10-CM | POA: Diagnosis not present

## 2018-10-03 DIAGNOSIS — N2581 Secondary hyperparathyroidism of renal origin: Secondary | ICD-10-CM | POA: Diagnosis not present

## 2018-10-03 DIAGNOSIS — Z23 Encounter for immunization: Secondary | ICD-10-CM | POA: Diagnosis not present

## 2018-10-03 DIAGNOSIS — N186 End stage renal disease: Secondary | ICD-10-CM | POA: Diagnosis not present

## 2018-10-03 DIAGNOSIS — D631 Anemia in chronic kidney disease: Secondary | ICD-10-CM | POA: Diagnosis not present

## 2018-10-03 DIAGNOSIS — D509 Iron deficiency anemia, unspecified: Secondary | ICD-10-CM | POA: Diagnosis not present

## 2018-10-04 DIAGNOSIS — Z23 Encounter for immunization: Secondary | ICD-10-CM | POA: Diagnosis not present

## 2018-10-04 DIAGNOSIS — D631 Anemia in chronic kidney disease: Secondary | ICD-10-CM | POA: Diagnosis not present

## 2018-10-04 DIAGNOSIS — D509 Iron deficiency anemia, unspecified: Secondary | ICD-10-CM | POA: Diagnosis not present

## 2018-10-04 DIAGNOSIS — N2581 Secondary hyperparathyroidism of renal origin: Secondary | ICD-10-CM | POA: Diagnosis not present

## 2018-10-04 DIAGNOSIS — Z992 Dependence on renal dialysis: Secondary | ICD-10-CM | POA: Diagnosis not present

## 2018-10-04 DIAGNOSIS — N186 End stage renal disease: Secondary | ICD-10-CM | POA: Diagnosis not present

## 2018-10-05 DIAGNOSIS — N2581 Secondary hyperparathyroidism of renal origin: Secondary | ICD-10-CM | POA: Diagnosis not present

## 2018-10-05 DIAGNOSIS — N186 End stage renal disease: Secondary | ICD-10-CM | POA: Diagnosis not present

## 2018-10-05 DIAGNOSIS — Z23 Encounter for immunization: Secondary | ICD-10-CM | POA: Diagnosis not present

## 2018-10-05 DIAGNOSIS — Z992 Dependence on renal dialysis: Secondary | ICD-10-CM | POA: Diagnosis not present

## 2018-10-05 DIAGNOSIS — D631 Anemia in chronic kidney disease: Secondary | ICD-10-CM | POA: Diagnosis not present

## 2018-10-05 DIAGNOSIS — D509 Iron deficiency anemia, unspecified: Secondary | ICD-10-CM | POA: Diagnosis not present

## 2018-10-06 DIAGNOSIS — D509 Iron deficiency anemia, unspecified: Secondary | ICD-10-CM | POA: Diagnosis not present

## 2018-10-06 DIAGNOSIS — D631 Anemia in chronic kidney disease: Secondary | ICD-10-CM | POA: Diagnosis not present

## 2018-10-06 DIAGNOSIS — N186 End stage renal disease: Secondary | ICD-10-CM | POA: Diagnosis not present

## 2018-10-06 DIAGNOSIS — N2581 Secondary hyperparathyroidism of renal origin: Secondary | ICD-10-CM | POA: Diagnosis not present

## 2018-10-06 DIAGNOSIS — Z23 Encounter for immunization: Secondary | ICD-10-CM | POA: Diagnosis not present

## 2018-10-06 DIAGNOSIS — Z992 Dependence on renal dialysis: Secondary | ICD-10-CM | POA: Diagnosis not present

## 2018-10-07 DIAGNOSIS — D509 Iron deficiency anemia, unspecified: Secondary | ICD-10-CM | POA: Diagnosis not present

## 2018-10-07 DIAGNOSIS — Z23 Encounter for immunization: Secondary | ICD-10-CM | POA: Diagnosis not present

## 2018-10-07 DIAGNOSIS — Z992 Dependence on renal dialysis: Secondary | ICD-10-CM | POA: Diagnosis not present

## 2018-10-07 DIAGNOSIS — N186 End stage renal disease: Secondary | ICD-10-CM | POA: Diagnosis not present

## 2018-10-07 DIAGNOSIS — D631 Anemia in chronic kidney disease: Secondary | ICD-10-CM | POA: Diagnosis not present

## 2018-10-07 DIAGNOSIS — N2581 Secondary hyperparathyroidism of renal origin: Secondary | ICD-10-CM | POA: Diagnosis not present

## 2018-10-08 DIAGNOSIS — Z992 Dependence on renal dialysis: Secondary | ICD-10-CM | POA: Diagnosis not present

## 2018-10-08 DIAGNOSIS — N2581 Secondary hyperparathyroidism of renal origin: Secondary | ICD-10-CM | POA: Diagnosis not present

## 2018-10-08 DIAGNOSIS — D631 Anemia in chronic kidney disease: Secondary | ICD-10-CM | POA: Diagnosis not present

## 2018-10-08 DIAGNOSIS — Z23 Encounter for immunization: Secondary | ICD-10-CM | POA: Diagnosis not present

## 2018-10-08 DIAGNOSIS — D509 Iron deficiency anemia, unspecified: Secondary | ICD-10-CM | POA: Diagnosis not present

## 2018-10-08 DIAGNOSIS — N186 End stage renal disease: Secondary | ICD-10-CM | POA: Diagnosis not present

## 2018-10-09 DIAGNOSIS — Z23 Encounter for immunization: Secondary | ICD-10-CM | POA: Diagnosis not present

## 2018-10-09 DIAGNOSIS — D631 Anemia in chronic kidney disease: Secondary | ICD-10-CM | POA: Diagnosis not present

## 2018-10-09 DIAGNOSIS — N186 End stage renal disease: Secondary | ICD-10-CM | POA: Diagnosis not present

## 2018-10-09 DIAGNOSIS — D509 Iron deficiency anemia, unspecified: Secondary | ICD-10-CM | POA: Diagnosis not present

## 2018-10-09 DIAGNOSIS — Z992 Dependence on renal dialysis: Secondary | ICD-10-CM | POA: Diagnosis not present

## 2018-10-09 DIAGNOSIS — N2581 Secondary hyperparathyroidism of renal origin: Secondary | ICD-10-CM | POA: Diagnosis not present

## 2018-10-10 DIAGNOSIS — N2581 Secondary hyperparathyroidism of renal origin: Secondary | ICD-10-CM | POA: Diagnosis not present

## 2018-10-10 DIAGNOSIS — Z992 Dependence on renal dialysis: Secondary | ICD-10-CM | POA: Diagnosis not present

## 2018-10-10 DIAGNOSIS — D631 Anemia in chronic kidney disease: Secondary | ICD-10-CM | POA: Diagnosis not present

## 2018-10-10 DIAGNOSIS — N186 End stage renal disease: Secondary | ICD-10-CM | POA: Diagnosis not present

## 2018-10-10 DIAGNOSIS — Z23 Encounter for immunization: Secondary | ICD-10-CM | POA: Diagnosis not present

## 2018-10-10 DIAGNOSIS — D509 Iron deficiency anemia, unspecified: Secondary | ICD-10-CM | POA: Diagnosis not present

## 2018-10-11 DIAGNOSIS — Z992 Dependence on renal dialysis: Secondary | ICD-10-CM | POA: Diagnosis not present

## 2018-10-11 DIAGNOSIS — N186 End stage renal disease: Secondary | ICD-10-CM | POA: Diagnosis not present

## 2018-10-11 DIAGNOSIS — N2581 Secondary hyperparathyroidism of renal origin: Secondary | ICD-10-CM | POA: Diagnosis not present

## 2018-10-11 DIAGNOSIS — Z23 Encounter for immunization: Secondary | ICD-10-CM | POA: Diagnosis not present

## 2018-10-11 DIAGNOSIS — D631 Anemia in chronic kidney disease: Secondary | ICD-10-CM | POA: Diagnosis not present

## 2018-10-11 DIAGNOSIS — D509 Iron deficiency anemia, unspecified: Secondary | ICD-10-CM | POA: Diagnosis not present

## 2018-10-12 DIAGNOSIS — D631 Anemia in chronic kidney disease: Secondary | ICD-10-CM | POA: Diagnosis not present

## 2018-10-12 DIAGNOSIS — D509 Iron deficiency anemia, unspecified: Secondary | ICD-10-CM | POA: Diagnosis not present

## 2018-10-12 DIAGNOSIS — N186 End stage renal disease: Secondary | ICD-10-CM | POA: Diagnosis not present

## 2018-10-12 DIAGNOSIS — N2581 Secondary hyperparathyroidism of renal origin: Secondary | ICD-10-CM | POA: Diagnosis not present

## 2018-10-12 DIAGNOSIS — Z992 Dependence on renal dialysis: Secondary | ICD-10-CM | POA: Diagnosis not present

## 2018-10-12 DIAGNOSIS — Z23 Encounter for immunization: Secondary | ICD-10-CM | POA: Diagnosis not present

## 2018-10-13 DIAGNOSIS — Z992 Dependence on renal dialysis: Secondary | ICD-10-CM | POA: Diagnosis not present

## 2018-10-13 DIAGNOSIS — D509 Iron deficiency anemia, unspecified: Secondary | ICD-10-CM | POA: Diagnosis not present

## 2018-10-13 DIAGNOSIS — Z23 Encounter for immunization: Secondary | ICD-10-CM | POA: Diagnosis not present

## 2018-10-13 DIAGNOSIS — N2581 Secondary hyperparathyroidism of renal origin: Secondary | ICD-10-CM | POA: Diagnosis not present

## 2018-10-13 DIAGNOSIS — N186 End stage renal disease: Secondary | ICD-10-CM | POA: Diagnosis not present

## 2018-10-13 DIAGNOSIS — D631 Anemia in chronic kidney disease: Secondary | ICD-10-CM | POA: Diagnosis not present

## 2018-10-14 DIAGNOSIS — Z992 Dependence on renal dialysis: Secondary | ICD-10-CM | POA: Diagnosis not present

## 2018-10-14 DIAGNOSIS — D631 Anemia in chronic kidney disease: Secondary | ICD-10-CM | POA: Diagnosis not present

## 2018-10-14 DIAGNOSIS — N186 End stage renal disease: Secondary | ICD-10-CM | POA: Diagnosis not present

## 2018-10-14 DIAGNOSIS — N2581 Secondary hyperparathyroidism of renal origin: Secondary | ICD-10-CM | POA: Diagnosis not present

## 2018-10-14 DIAGNOSIS — Z23 Encounter for immunization: Secondary | ICD-10-CM | POA: Diagnosis not present

## 2018-10-14 DIAGNOSIS — D509 Iron deficiency anemia, unspecified: Secondary | ICD-10-CM | POA: Diagnosis not present

## 2018-10-15 DIAGNOSIS — N2581 Secondary hyperparathyroidism of renal origin: Secondary | ICD-10-CM | POA: Diagnosis not present

## 2018-10-15 DIAGNOSIS — D631 Anemia in chronic kidney disease: Secondary | ICD-10-CM | POA: Diagnosis not present

## 2018-10-15 DIAGNOSIS — D509 Iron deficiency anemia, unspecified: Secondary | ICD-10-CM | POA: Diagnosis not present

## 2018-10-15 DIAGNOSIS — Z992 Dependence on renal dialysis: Secondary | ICD-10-CM | POA: Diagnosis not present

## 2018-10-15 DIAGNOSIS — N186 End stage renal disease: Secondary | ICD-10-CM | POA: Diagnosis not present

## 2018-10-15 DIAGNOSIS — Z23 Encounter for immunization: Secondary | ICD-10-CM | POA: Diagnosis not present

## 2018-10-16 DIAGNOSIS — Z992 Dependence on renal dialysis: Secondary | ICD-10-CM | POA: Diagnosis not present

## 2018-10-16 DIAGNOSIS — Z23 Encounter for immunization: Secondary | ICD-10-CM | POA: Diagnosis not present

## 2018-10-16 DIAGNOSIS — N186 End stage renal disease: Secondary | ICD-10-CM | POA: Diagnosis not present

## 2018-10-16 DIAGNOSIS — N2581 Secondary hyperparathyroidism of renal origin: Secondary | ICD-10-CM | POA: Diagnosis not present

## 2018-10-16 DIAGNOSIS — D509 Iron deficiency anemia, unspecified: Secondary | ICD-10-CM | POA: Diagnosis not present

## 2018-10-16 DIAGNOSIS — D631 Anemia in chronic kidney disease: Secondary | ICD-10-CM | POA: Diagnosis not present

## 2018-10-17 DIAGNOSIS — D509 Iron deficiency anemia, unspecified: Secondary | ICD-10-CM | POA: Diagnosis not present

## 2018-10-17 DIAGNOSIS — Z992 Dependence on renal dialysis: Secondary | ICD-10-CM | POA: Diagnosis not present

## 2018-10-17 DIAGNOSIS — N186 End stage renal disease: Secondary | ICD-10-CM | POA: Diagnosis not present

## 2018-10-17 DIAGNOSIS — D631 Anemia in chronic kidney disease: Secondary | ICD-10-CM | POA: Diagnosis not present

## 2018-10-17 DIAGNOSIS — N2581 Secondary hyperparathyroidism of renal origin: Secondary | ICD-10-CM | POA: Diagnosis not present

## 2018-10-17 DIAGNOSIS — Z23 Encounter for immunization: Secondary | ICD-10-CM | POA: Diagnosis not present

## 2018-10-18 DIAGNOSIS — D509 Iron deficiency anemia, unspecified: Secondary | ICD-10-CM | POA: Diagnosis not present

## 2018-10-18 DIAGNOSIS — N186 End stage renal disease: Secondary | ICD-10-CM | POA: Diagnosis not present

## 2018-10-18 DIAGNOSIS — N2581 Secondary hyperparathyroidism of renal origin: Secondary | ICD-10-CM | POA: Diagnosis not present

## 2018-10-18 DIAGNOSIS — D631 Anemia in chronic kidney disease: Secondary | ICD-10-CM | POA: Diagnosis not present

## 2018-10-18 DIAGNOSIS — Z992 Dependence on renal dialysis: Secondary | ICD-10-CM | POA: Diagnosis not present

## 2018-10-18 DIAGNOSIS — Z23 Encounter for immunization: Secondary | ICD-10-CM | POA: Diagnosis not present

## 2018-10-19 DIAGNOSIS — N2581 Secondary hyperparathyroidism of renal origin: Secondary | ICD-10-CM | POA: Diagnosis not present

## 2018-10-19 DIAGNOSIS — D509 Iron deficiency anemia, unspecified: Secondary | ICD-10-CM | POA: Diagnosis not present

## 2018-10-19 DIAGNOSIS — N186 End stage renal disease: Secondary | ICD-10-CM | POA: Diagnosis not present

## 2018-10-19 DIAGNOSIS — Z992 Dependence on renal dialysis: Secondary | ICD-10-CM | POA: Diagnosis not present

## 2018-10-19 DIAGNOSIS — D631 Anemia in chronic kidney disease: Secondary | ICD-10-CM | POA: Diagnosis not present

## 2018-10-19 DIAGNOSIS — Z23 Encounter for immunization: Secondary | ICD-10-CM | POA: Diagnosis not present

## 2018-10-20 DIAGNOSIS — D631 Anemia in chronic kidney disease: Secondary | ICD-10-CM | POA: Diagnosis not present

## 2018-10-20 DIAGNOSIS — D509 Iron deficiency anemia, unspecified: Secondary | ICD-10-CM | POA: Diagnosis not present

## 2018-10-20 DIAGNOSIS — Z992 Dependence on renal dialysis: Secondary | ICD-10-CM | POA: Diagnosis not present

## 2018-10-20 DIAGNOSIS — N186 End stage renal disease: Secondary | ICD-10-CM | POA: Diagnosis not present

## 2018-10-21 DIAGNOSIS — N186 End stage renal disease: Secondary | ICD-10-CM | POA: Diagnosis not present

## 2018-10-21 DIAGNOSIS — D509 Iron deficiency anemia, unspecified: Secondary | ICD-10-CM | POA: Diagnosis not present

## 2018-10-21 DIAGNOSIS — Z992 Dependence on renal dialysis: Secondary | ICD-10-CM | POA: Diagnosis not present

## 2018-10-21 DIAGNOSIS — D631 Anemia in chronic kidney disease: Secondary | ICD-10-CM | POA: Diagnosis not present

## 2018-10-22 DIAGNOSIS — N186 End stage renal disease: Secondary | ICD-10-CM | POA: Diagnosis not present

## 2018-10-22 DIAGNOSIS — D509 Iron deficiency anemia, unspecified: Secondary | ICD-10-CM | POA: Diagnosis not present

## 2018-10-22 DIAGNOSIS — D631 Anemia in chronic kidney disease: Secondary | ICD-10-CM | POA: Diagnosis not present

## 2018-10-22 DIAGNOSIS — Z992 Dependence on renal dialysis: Secondary | ICD-10-CM | POA: Diagnosis not present

## 2018-10-23 DIAGNOSIS — D509 Iron deficiency anemia, unspecified: Secondary | ICD-10-CM | POA: Diagnosis not present

## 2018-10-23 DIAGNOSIS — Z992 Dependence on renal dialysis: Secondary | ICD-10-CM | POA: Diagnosis not present

## 2018-10-23 DIAGNOSIS — D631 Anemia in chronic kidney disease: Secondary | ICD-10-CM | POA: Diagnosis not present

## 2018-10-23 DIAGNOSIS — N186 End stage renal disease: Secondary | ICD-10-CM | POA: Diagnosis not present

## 2018-10-24 DIAGNOSIS — N186 End stage renal disease: Secondary | ICD-10-CM | POA: Diagnosis not present

## 2018-10-24 DIAGNOSIS — D631 Anemia in chronic kidney disease: Secondary | ICD-10-CM | POA: Diagnosis not present

## 2018-10-24 DIAGNOSIS — D509 Iron deficiency anemia, unspecified: Secondary | ICD-10-CM | POA: Diagnosis not present

## 2018-10-24 DIAGNOSIS — Z992 Dependence on renal dialysis: Secondary | ICD-10-CM | POA: Diagnosis not present

## 2018-10-25 DIAGNOSIS — D509 Iron deficiency anemia, unspecified: Secondary | ICD-10-CM | POA: Diagnosis not present

## 2018-10-25 DIAGNOSIS — Z992 Dependence on renal dialysis: Secondary | ICD-10-CM | POA: Diagnosis not present

## 2018-10-25 DIAGNOSIS — N186 End stage renal disease: Secondary | ICD-10-CM | POA: Diagnosis not present

## 2018-10-25 DIAGNOSIS — D631 Anemia in chronic kidney disease: Secondary | ICD-10-CM | POA: Diagnosis not present

## 2018-10-26 DIAGNOSIS — D509 Iron deficiency anemia, unspecified: Secondary | ICD-10-CM | POA: Diagnosis not present

## 2018-10-26 DIAGNOSIS — D631 Anemia in chronic kidney disease: Secondary | ICD-10-CM | POA: Diagnosis not present

## 2018-10-26 DIAGNOSIS — N186 End stage renal disease: Secondary | ICD-10-CM | POA: Diagnosis not present

## 2018-10-26 DIAGNOSIS — Z992 Dependence on renal dialysis: Secondary | ICD-10-CM | POA: Diagnosis not present

## 2018-10-27 DIAGNOSIS — D509 Iron deficiency anemia, unspecified: Secondary | ICD-10-CM | POA: Diagnosis not present

## 2018-10-27 DIAGNOSIS — D631 Anemia in chronic kidney disease: Secondary | ICD-10-CM | POA: Diagnosis not present

## 2018-10-27 DIAGNOSIS — N186 End stage renal disease: Secondary | ICD-10-CM | POA: Diagnosis not present

## 2018-10-27 DIAGNOSIS — Z992 Dependence on renal dialysis: Secondary | ICD-10-CM | POA: Diagnosis not present

## 2018-10-28 DIAGNOSIS — Z992 Dependence on renal dialysis: Secondary | ICD-10-CM | POA: Diagnosis not present

## 2018-10-28 DIAGNOSIS — D631 Anemia in chronic kidney disease: Secondary | ICD-10-CM | POA: Diagnosis not present

## 2018-10-28 DIAGNOSIS — N186 End stage renal disease: Secondary | ICD-10-CM | POA: Diagnosis not present

## 2018-10-28 DIAGNOSIS — D509 Iron deficiency anemia, unspecified: Secondary | ICD-10-CM | POA: Diagnosis not present

## 2018-10-29 DIAGNOSIS — N186 End stage renal disease: Secondary | ICD-10-CM | POA: Diagnosis not present

## 2018-10-29 DIAGNOSIS — Z992 Dependence on renal dialysis: Secondary | ICD-10-CM | POA: Diagnosis not present

## 2018-10-29 DIAGNOSIS — D631 Anemia in chronic kidney disease: Secondary | ICD-10-CM | POA: Diagnosis not present

## 2018-10-29 DIAGNOSIS — D509 Iron deficiency anemia, unspecified: Secondary | ICD-10-CM | POA: Diagnosis not present

## 2018-10-30 DIAGNOSIS — N186 End stage renal disease: Secondary | ICD-10-CM | POA: Diagnosis not present

## 2018-10-30 DIAGNOSIS — D509 Iron deficiency anemia, unspecified: Secondary | ICD-10-CM | POA: Diagnosis not present

## 2018-10-30 DIAGNOSIS — Z992 Dependence on renal dialysis: Secondary | ICD-10-CM | POA: Diagnosis not present

## 2018-10-30 DIAGNOSIS — D631 Anemia in chronic kidney disease: Secondary | ICD-10-CM | POA: Diagnosis not present

## 2018-10-31 DIAGNOSIS — N186 End stage renal disease: Secondary | ICD-10-CM | POA: Diagnosis not present

## 2018-10-31 DIAGNOSIS — D631 Anemia in chronic kidney disease: Secondary | ICD-10-CM | POA: Diagnosis not present

## 2018-10-31 DIAGNOSIS — D509 Iron deficiency anemia, unspecified: Secondary | ICD-10-CM | POA: Diagnosis not present

## 2018-10-31 DIAGNOSIS — Z992 Dependence on renal dialysis: Secondary | ICD-10-CM | POA: Diagnosis not present

## 2018-11-01 DIAGNOSIS — D631 Anemia in chronic kidney disease: Secondary | ICD-10-CM | POA: Diagnosis not present

## 2018-11-01 DIAGNOSIS — N186 End stage renal disease: Secondary | ICD-10-CM | POA: Diagnosis not present

## 2018-11-01 DIAGNOSIS — Z992 Dependence on renal dialysis: Secondary | ICD-10-CM | POA: Diagnosis not present

## 2018-11-01 DIAGNOSIS — D509 Iron deficiency anemia, unspecified: Secondary | ICD-10-CM | POA: Diagnosis not present

## 2018-11-02 DIAGNOSIS — Z992 Dependence on renal dialysis: Secondary | ICD-10-CM | POA: Diagnosis not present

## 2018-11-02 DIAGNOSIS — D631 Anemia in chronic kidney disease: Secondary | ICD-10-CM | POA: Diagnosis not present

## 2018-11-02 DIAGNOSIS — D509 Iron deficiency anemia, unspecified: Secondary | ICD-10-CM | POA: Diagnosis not present

## 2018-11-02 DIAGNOSIS — N186 End stage renal disease: Secondary | ICD-10-CM | POA: Diagnosis not present

## 2018-11-03 DIAGNOSIS — Z992 Dependence on renal dialysis: Secondary | ICD-10-CM | POA: Diagnosis not present

## 2018-11-03 DIAGNOSIS — D631 Anemia in chronic kidney disease: Secondary | ICD-10-CM | POA: Diagnosis not present

## 2018-11-03 DIAGNOSIS — D509 Iron deficiency anemia, unspecified: Secondary | ICD-10-CM | POA: Diagnosis not present

## 2018-11-03 DIAGNOSIS — N186 End stage renal disease: Secondary | ICD-10-CM | POA: Diagnosis not present

## 2018-11-04 DIAGNOSIS — N186 End stage renal disease: Secondary | ICD-10-CM | POA: Diagnosis not present

## 2018-11-04 DIAGNOSIS — D509 Iron deficiency anemia, unspecified: Secondary | ICD-10-CM | POA: Diagnosis not present

## 2018-11-04 DIAGNOSIS — D631 Anemia in chronic kidney disease: Secondary | ICD-10-CM | POA: Diagnosis not present

## 2018-11-04 DIAGNOSIS — Z992 Dependence on renal dialysis: Secondary | ICD-10-CM | POA: Diagnosis not present

## 2018-11-05 DIAGNOSIS — D631 Anemia in chronic kidney disease: Secondary | ICD-10-CM | POA: Diagnosis not present

## 2018-11-05 DIAGNOSIS — D509 Iron deficiency anemia, unspecified: Secondary | ICD-10-CM | POA: Diagnosis not present

## 2018-11-05 DIAGNOSIS — N186 End stage renal disease: Secondary | ICD-10-CM | POA: Diagnosis not present

## 2018-11-05 DIAGNOSIS — Z992 Dependence on renal dialysis: Secondary | ICD-10-CM | POA: Diagnosis not present

## 2018-11-06 DIAGNOSIS — N186 End stage renal disease: Secondary | ICD-10-CM | POA: Diagnosis not present

## 2018-11-06 DIAGNOSIS — D631 Anemia in chronic kidney disease: Secondary | ICD-10-CM | POA: Diagnosis not present

## 2018-11-06 DIAGNOSIS — D509 Iron deficiency anemia, unspecified: Secondary | ICD-10-CM | POA: Diagnosis not present

## 2018-11-06 DIAGNOSIS — Z992 Dependence on renal dialysis: Secondary | ICD-10-CM | POA: Diagnosis not present

## 2018-11-07 DIAGNOSIS — D631 Anemia in chronic kidney disease: Secondary | ICD-10-CM | POA: Diagnosis not present

## 2018-11-07 DIAGNOSIS — D509 Iron deficiency anemia, unspecified: Secondary | ICD-10-CM | POA: Diagnosis not present

## 2018-11-07 DIAGNOSIS — Z992 Dependence on renal dialysis: Secondary | ICD-10-CM | POA: Diagnosis not present

## 2018-11-07 DIAGNOSIS — N186 End stage renal disease: Secondary | ICD-10-CM | POA: Diagnosis not present

## 2018-11-08 DIAGNOSIS — Z992 Dependence on renal dialysis: Secondary | ICD-10-CM | POA: Diagnosis not present

## 2018-11-08 DIAGNOSIS — D509 Iron deficiency anemia, unspecified: Secondary | ICD-10-CM | POA: Diagnosis not present

## 2018-11-08 DIAGNOSIS — D631 Anemia in chronic kidney disease: Secondary | ICD-10-CM | POA: Diagnosis not present

## 2018-11-08 DIAGNOSIS — N186 End stage renal disease: Secondary | ICD-10-CM | POA: Diagnosis not present

## 2018-11-09 DIAGNOSIS — N186 End stage renal disease: Secondary | ICD-10-CM | POA: Diagnosis not present

## 2018-11-09 DIAGNOSIS — Z992 Dependence on renal dialysis: Secondary | ICD-10-CM | POA: Diagnosis not present

## 2018-11-09 DIAGNOSIS — D509 Iron deficiency anemia, unspecified: Secondary | ICD-10-CM | POA: Diagnosis not present

## 2018-11-09 DIAGNOSIS — D631 Anemia in chronic kidney disease: Secondary | ICD-10-CM | POA: Diagnosis not present

## 2018-11-10 DIAGNOSIS — Z992 Dependence on renal dialysis: Secondary | ICD-10-CM | POA: Diagnosis not present

## 2018-11-10 DIAGNOSIS — D509 Iron deficiency anemia, unspecified: Secondary | ICD-10-CM | POA: Diagnosis not present

## 2018-11-10 DIAGNOSIS — N186 End stage renal disease: Secondary | ICD-10-CM | POA: Diagnosis not present

## 2018-11-10 DIAGNOSIS — D631 Anemia in chronic kidney disease: Secondary | ICD-10-CM | POA: Diagnosis not present

## 2018-11-11 DIAGNOSIS — D631 Anemia in chronic kidney disease: Secondary | ICD-10-CM | POA: Diagnosis not present

## 2018-11-11 DIAGNOSIS — Z992 Dependence on renal dialysis: Secondary | ICD-10-CM | POA: Diagnosis not present

## 2018-11-11 DIAGNOSIS — N186 End stage renal disease: Secondary | ICD-10-CM | POA: Diagnosis not present

## 2018-11-11 DIAGNOSIS — D509 Iron deficiency anemia, unspecified: Secondary | ICD-10-CM | POA: Diagnosis not present

## 2018-11-12 DIAGNOSIS — N186 End stage renal disease: Secondary | ICD-10-CM | POA: Diagnosis not present

## 2018-11-12 DIAGNOSIS — D631 Anemia in chronic kidney disease: Secondary | ICD-10-CM | POA: Diagnosis not present

## 2018-11-12 DIAGNOSIS — Z992 Dependence on renal dialysis: Secondary | ICD-10-CM | POA: Diagnosis not present

## 2018-11-12 DIAGNOSIS — D509 Iron deficiency anemia, unspecified: Secondary | ICD-10-CM | POA: Diagnosis not present

## 2018-11-13 DIAGNOSIS — Z992 Dependence on renal dialysis: Secondary | ICD-10-CM | POA: Diagnosis not present

## 2018-11-13 DIAGNOSIS — D631 Anemia in chronic kidney disease: Secondary | ICD-10-CM | POA: Diagnosis not present

## 2018-11-13 DIAGNOSIS — N186 End stage renal disease: Secondary | ICD-10-CM | POA: Diagnosis not present

## 2018-11-13 DIAGNOSIS — D509 Iron deficiency anemia, unspecified: Secondary | ICD-10-CM | POA: Diagnosis not present

## 2018-11-14 DIAGNOSIS — Z992 Dependence on renal dialysis: Secondary | ICD-10-CM | POA: Diagnosis not present

## 2018-11-14 DIAGNOSIS — N186 End stage renal disease: Secondary | ICD-10-CM | POA: Diagnosis not present

## 2018-11-14 DIAGNOSIS — D631 Anemia in chronic kidney disease: Secondary | ICD-10-CM | POA: Diagnosis not present

## 2018-11-14 DIAGNOSIS — D509 Iron deficiency anemia, unspecified: Secondary | ICD-10-CM | POA: Diagnosis not present

## 2018-11-15 DIAGNOSIS — Z992 Dependence on renal dialysis: Secondary | ICD-10-CM | POA: Diagnosis not present

## 2018-11-15 DIAGNOSIS — N186 End stage renal disease: Secondary | ICD-10-CM | POA: Diagnosis not present

## 2018-11-15 DIAGNOSIS — D631 Anemia in chronic kidney disease: Secondary | ICD-10-CM | POA: Diagnosis not present

## 2018-11-15 DIAGNOSIS — D509 Iron deficiency anemia, unspecified: Secondary | ICD-10-CM | POA: Diagnosis not present

## 2018-11-16 DIAGNOSIS — D509 Iron deficiency anemia, unspecified: Secondary | ICD-10-CM | POA: Diagnosis not present

## 2018-11-16 DIAGNOSIS — D631 Anemia in chronic kidney disease: Secondary | ICD-10-CM | POA: Diagnosis not present

## 2018-11-16 DIAGNOSIS — N186 End stage renal disease: Secondary | ICD-10-CM | POA: Diagnosis not present

## 2018-11-16 DIAGNOSIS — Z992 Dependence on renal dialysis: Secondary | ICD-10-CM | POA: Diagnosis not present

## 2018-11-17 DIAGNOSIS — D631 Anemia in chronic kidney disease: Secondary | ICD-10-CM | POA: Diagnosis not present

## 2018-11-17 DIAGNOSIS — Z992 Dependence on renal dialysis: Secondary | ICD-10-CM | POA: Diagnosis not present

## 2018-11-17 DIAGNOSIS — D509 Iron deficiency anemia, unspecified: Secondary | ICD-10-CM | POA: Diagnosis not present

## 2018-11-17 DIAGNOSIS — N186 End stage renal disease: Secondary | ICD-10-CM | POA: Diagnosis not present

## 2018-11-18 DIAGNOSIS — D509 Iron deficiency anemia, unspecified: Secondary | ICD-10-CM | POA: Diagnosis not present

## 2018-11-18 DIAGNOSIS — Z992 Dependence on renal dialysis: Secondary | ICD-10-CM | POA: Diagnosis not present

## 2018-11-18 DIAGNOSIS — D631 Anemia in chronic kidney disease: Secondary | ICD-10-CM | POA: Diagnosis not present

## 2018-11-18 DIAGNOSIS — N186 End stage renal disease: Secondary | ICD-10-CM | POA: Diagnosis not present

## 2018-11-19 DIAGNOSIS — N2581 Secondary hyperparathyroidism of renal origin: Secondary | ICD-10-CM | POA: Diagnosis not present

## 2018-11-19 DIAGNOSIS — Z992 Dependence on renal dialysis: Secondary | ICD-10-CM | POA: Diagnosis not present

## 2018-11-19 DIAGNOSIS — D509 Iron deficiency anemia, unspecified: Secondary | ICD-10-CM | POA: Diagnosis not present

## 2018-11-19 DIAGNOSIS — D631 Anemia in chronic kidney disease: Secondary | ICD-10-CM | POA: Diagnosis not present

## 2018-11-19 DIAGNOSIS — N186 End stage renal disease: Secondary | ICD-10-CM | POA: Diagnosis not present

## 2018-11-20 DIAGNOSIS — D509 Iron deficiency anemia, unspecified: Secondary | ICD-10-CM | POA: Diagnosis not present

## 2018-11-20 DIAGNOSIS — Z992 Dependence on renal dialysis: Secondary | ICD-10-CM | POA: Diagnosis not present

## 2018-11-20 DIAGNOSIS — N186 End stage renal disease: Secondary | ICD-10-CM | POA: Diagnosis not present

## 2018-11-20 DIAGNOSIS — N2581 Secondary hyperparathyroidism of renal origin: Secondary | ICD-10-CM | POA: Diagnosis not present

## 2018-11-20 DIAGNOSIS — D631 Anemia in chronic kidney disease: Secondary | ICD-10-CM | POA: Diagnosis not present

## 2018-11-21 DIAGNOSIS — D631 Anemia in chronic kidney disease: Secondary | ICD-10-CM | POA: Diagnosis not present

## 2018-11-21 DIAGNOSIS — N2581 Secondary hyperparathyroidism of renal origin: Secondary | ICD-10-CM | POA: Diagnosis not present

## 2018-11-21 DIAGNOSIS — D509 Iron deficiency anemia, unspecified: Secondary | ICD-10-CM | POA: Diagnosis not present

## 2018-11-21 DIAGNOSIS — Z992 Dependence on renal dialysis: Secondary | ICD-10-CM | POA: Diagnosis not present

## 2018-11-21 DIAGNOSIS — N186 End stage renal disease: Secondary | ICD-10-CM | POA: Diagnosis not present

## 2018-11-22 DIAGNOSIS — N186 End stage renal disease: Secondary | ICD-10-CM | POA: Diagnosis not present

## 2018-11-22 DIAGNOSIS — Z992 Dependence on renal dialysis: Secondary | ICD-10-CM | POA: Diagnosis not present

## 2018-11-22 DIAGNOSIS — D509 Iron deficiency anemia, unspecified: Secondary | ICD-10-CM | POA: Diagnosis not present

## 2018-11-22 DIAGNOSIS — N2581 Secondary hyperparathyroidism of renal origin: Secondary | ICD-10-CM | POA: Diagnosis not present

## 2018-11-22 DIAGNOSIS — D631 Anemia in chronic kidney disease: Secondary | ICD-10-CM | POA: Diagnosis not present

## 2018-11-23 DIAGNOSIS — D509 Iron deficiency anemia, unspecified: Secondary | ICD-10-CM | POA: Diagnosis not present

## 2018-11-23 DIAGNOSIS — Z992 Dependence on renal dialysis: Secondary | ICD-10-CM | POA: Diagnosis not present

## 2018-11-23 DIAGNOSIS — N2581 Secondary hyperparathyroidism of renal origin: Secondary | ICD-10-CM | POA: Diagnosis not present

## 2018-11-23 DIAGNOSIS — N186 End stage renal disease: Secondary | ICD-10-CM | POA: Diagnosis not present

## 2018-11-23 DIAGNOSIS — D631 Anemia in chronic kidney disease: Secondary | ICD-10-CM | POA: Diagnosis not present

## 2018-11-24 DIAGNOSIS — N186 End stage renal disease: Secondary | ICD-10-CM | POA: Diagnosis not present

## 2018-11-24 DIAGNOSIS — Z992 Dependence on renal dialysis: Secondary | ICD-10-CM | POA: Diagnosis not present

## 2018-11-24 DIAGNOSIS — D509 Iron deficiency anemia, unspecified: Secondary | ICD-10-CM | POA: Diagnosis not present

## 2018-11-24 DIAGNOSIS — D631 Anemia in chronic kidney disease: Secondary | ICD-10-CM | POA: Diagnosis not present

## 2018-11-24 DIAGNOSIS — N2581 Secondary hyperparathyroidism of renal origin: Secondary | ICD-10-CM | POA: Diagnosis not present

## 2018-11-25 DIAGNOSIS — D509 Iron deficiency anemia, unspecified: Secondary | ICD-10-CM | POA: Diagnosis not present

## 2018-11-25 DIAGNOSIS — N186 End stage renal disease: Secondary | ICD-10-CM | POA: Diagnosis not present

## 2018-11-25 DIAGNOSIS — D631 Anemia in chronic kidney disease: Secondary | ICD-10-CM | POA: Diagnosis not present

## 2018-11-25 DIAGNOSIS — N2581 Secondary hyperparathyroidism of renal origin: Secondary | ICD-10-CM | POA: Diagnosis not present

## 2018-11-25 DIAGNOSIS — Z992 Dependence on renal dialysis: Secondary | ICD-10-CM | POA: Diagnosis not present

## 2018-11-26 DIAGNOSIS — N2581 Secondary hyperparathyroidism of renal origin: Secondary | ICD-10-CM | POA: Diagnosis not present

## 2018-11-26 DIAGNOSIS — D631 Anemia in chronic kidney disease: Secondary | ICD-10-CM | POA: Diagnosis not present

## 2018-11-26 DIAGNOSIS — D509 Iron deficiency anemia, unspecified: Secondary | ICD-10-CM | POA: Diagnosis not present

## 2018-11-26 DIAGNOSIS — Z992 Dependence on renal dialysis: Secondary | ICD-10-CM | POA: Diagnosis not present

## 2018-11-26 DIAGNOSIS — N186 End stage renal disease: Secondary | ICD-10-CM | POA: Diagnosis not present

## 2018-11-27 DIAGNOSIS — Z992 Dependence on renal dialysis: Secondary | ICD-10-CM | POA: Diagnosis not present

## 2018-11-27 DIAGNOSIS — N2581 Secondary hyperparathyroidism of renal origin: Secondary | ICD-10-CM | POA: Diagnosis not present

## 2018-11-27 DIAGNOSIS — N186 End stage renal disease: Secondary | ICD-10-CM | POA: Diagnosis not present

## 2018-11-27 DIAGNOSIS — D631 Anemia in chronic kidney disease: Secondary | ICD-10-CM | POA: Diagnosis not present

## 2018-11-27 DIAGNOSIS — D509 Iron deficiency anemia, unspecified: Secondary | ICD-10-CM | POA: Diagnosis not present

## 2018-11-28 DIAGNOSIS — N186 End stage renal disease: Secondary | ICD-10-CM | POA: Diagnosis not present

## 2018-11-28 DIAGNOSIS — N2581 Secondary hyperparathyroidism of renal origin: Secondary | ICD-10-CM | POA: Diagnosis not present

## 2018-11-28 DIAGNOSIS — D509 Iron deficiency anemia, unspecified: Secondary | ICD-10-CM | POA: Diagnosis not present

## 2018-11-28 DIAGNOSIS — Z992 Dependence on renal dialysis: Secondary | ICD-10-CM | POA: Diagnosis not present

## 2018-11-28 DIAGNOSIS — D631 Anemia in chronic kidney disease: Secondary | ICD-10-CM | POA: Diagnosis not present

## 2018-11-29 DIAGNOSIS — N2581 Secondary hyperparathyroidism of renal origin: Secondary | ICD-10-CM | POA: Diagnosis not present

## 2018-11-29 DIAGNOSIS — Z992 Dependence on renal dialysis: Secondary | ICD-10-CM | POA: Diagnosis not present

## 2018-11-29 DIAGNOSIS — D631 Anemia in chronic kidney disease: Secondary | ICD-10-CM | POA: Diagnosis not present

## 2018-11-29 DIAGNOSIS — D509 Iron deficiency anemia, unspecified: Secondary | ICD-10-CM | POA: Diagnosis not present

## 2018-11-29 DIAGNOSIS — N186 End stage renal disease: Secondary | ICD-10-CM | POA: Diagnosis not present

## 2018-11-30 DIAGNOSIS — D631 Anemia in chronic kidney disease: Secondary | ICD-10-CM | POA: Diagnosis not present

## 2018-11-30 DIAGNOSIS — N186 End stage renal disease: Secondary | ICD-10-CM | POA: Diagnosis not present

## 2018-11-30 DIAGNOSIS — Z992 Dependence on renal dialysis: Secondary | ICD-10-CM | POA: Diagnosis not present

## 2018-11-30 DIAGNOSIS — D509 Iron deficiency anemia, unspecified: Secondary | ICD-10-CM | POA: Diagnosis not present

## 2018-11-30 DIAGNOSIS — N2581 Secondary hyperparathyroidism of renal origin: Secondary | ICD-10-CM | POA: Diagnosis not present

## 2018-12-01 DIAGNOSIS — N2581 Secondary hyperparathyroidism of renal origin: Secondary | ICD-10-CM | POA: Diagnosis not present

## 2018-12-01 DIAGNOSIS — N186 End stage renal disease: Secondary | ICD-10-CM | POA: Diagnosis not present

## 2018-12-01 DIAGNOSIS — D509 Iron deficiency anemia, unspecified: Secondary | ICD-10-CM | POA: Diagnosis not present

## 2018-12-01 DIAGNOSIS — D631 Anemia in chronic kidney disease: Secondary | ICD-10-CM | POA: Diagnosis not present

## 2018-12-01 DIAGNOSIS — Z992 Dependence on renal dialysis: Secondary | ICD-10-CM | POA: Diagnosis not present

## 2018-12-02 DIAGNOSIS — Z992 Dependence on renal dialysis: Secondary | ICD-10-CM | POA: Diagnosis not present

## 2018-12-02 DIAGNOSIS — D509 Iron deficiency anemia, unspecified: Secondary | ICD-10-CM | POA: Diagnosis not present

## 2018-12-02 DIAGNOSIS — D631 Anemia in chronic kidney disease: Secondary | ICD-10-CM | POA: Diagnosis not present

## 2018-12-02 DIAGNOSIS — N186 End stage renal disease: Secondary | ICD-10-CM | POA: Diagnosis not present

## 2018-12-02 DIAGNOSIS — N2581 Secondary hyperparathyroidism of renal origin: Secondary | ICD-10-CM | POA: Diagnosis not present

## 2018-12-03 DIAGNOSIS — N186 End stage renal disease: Secondary | ICD-10-CM | POA: Diagnosis not present

## 2018-12-03 DIAGNOSIS — N2581 Secondary hyperparathyroidism of renal origin: Secondary | ICD-10-CM | POA: Diagnosis not present

## 2018-12-03 DIAGNOSIS — Z992 Dependence on renal dialysis: Secondary | ICD-10-CM | POA: Diagnosis not present

## 2018-12-03 DIAGNOSIS — D631 Anemia in chronic kidney disease: Secondary | ICD-10-CM | POA: Diagnosis not present

## 2018-12-03 DIAGNOSIS — D509 Iron deficiency anemia, unspecified: Secondary | ICD-10-CM | POA: Diagnosis not present

## 2018-12-04 DIAGNOSIS — N186 End stage renal disease: Secondary | ICD-10-CM | POA: Diagnosis not present

## 2018-12-04 DIAGNOSIS — D509 Iron deficiency anemia, unspecified: Secondary | ICD-10-CM | POA: Diagnosis not present

## 2018-12-04 DIAGNOSIS — N2581 Secondary hyperparathyroidism of renal origin: Secondary | ICD-10-CM | POA: Diagnosis not present

## 2018-12-04 DIAGNOSIS — Z992 Dependence on renal dialysis: Secondary | ICD-10-CM | POA: Diagnosis not present

## 2018-12-04 DIAGNOSIS — D631 Anemia in chronic kidney disease: Secondary | ICD-10-CM | POA: Diagnosis not present

## 2018-12-05 DIAGNOSIS — N2581 Secondary hyperparathyroidism of renal origin: Secondary | ICD-10-CM | POA: Diagnosis not present

## 2018-12-05 DIAGNOSIS — Z992 Dependence on renal dialysis: Secondary | ICD-10-CM | POA: Diagnosis not present

## 2018-12-05 DIAGNOSIS — D509 Iron deficiency anemia, unspecified: Secondary | ICD-10-CM | POA: Diagnosis not present

## 2018-12-05 DIAGNOSIS — N186 End stage renal disease: Secondary | ICD-10-CM | POA: Diagnosis not present

## 2018-12-05 DIAGNOSIS — D631 Anemia in chronic kidney disease: Secondary | ICD-10-CM | POA: Diagnosis not present

## 2018-12-06 DIAGNOSIS — Z992 Dependence on renal dialysis: Secondary | ICD-10-CM | POA: Diagnosis not present

## 2018-12-06 DIAGNOSIS — N186 End stage renal disease: Secondary | ICD-10-CM | POA: Diagnosis not present

## 2018-12-06 DIAGNOSIS — D509 Iron deficiency anemia, unspecified: Secondary | ICD-10-CM | POA: Diagnosis not present

## 2018-12-06 DIAGNOSIS — N2581 Secondary hyperparathyroidism of renal origin: Secondary | ICD-10-CM | POA: Diagnosis not present

## 2018-12-06 DIAGNOSIS — D631 Anemia in chronic kidney disease: Secondary | ICD-10-CM | POA: Diagnosis not present

## 2018-12-07 DIAGNOSIS — N186 End stage renal disease: Secondary | ICD-10-CM | POA: Diagnosis not present

## 2018-12-07 DIAGNOSIS — D509 Iron deficiency anemia, unspecified: Secondary | ICD-10-CM | POA: Diagnosis not present

## 2018-12-07 DIAGNOSIS — N2581 Secondary hyperparathyroidism of renal origin: Secondary | ICD-10-CM | POA: Diagnosis not present

## 2018-12-07 DIAGNOSIS — D631 Anemia in chronic kidney disease: Secondary | ICD-10-CM | POA: Diagnosis not present

## 2018-12-07 DIAGNOSIS — Z992 Dependence on renal dialysis: Secondary | ICD-10-CM | POA: Diagnosis not present

## 2018-12-08 DIAGNOSIS — D631 Anemia in chronic kidney disease: Secondary | ICD-10-CM | POA: Diagnosis not present

## 2018-12-08 DIAGNOSIS — D509 Iron deficiency anemia, unspecified: Secondary | ICD-10-CM | POA: Diagnosis not present

## 2018-12-08 DIAGNOSIS — N186 End stage renal disease: Secondary | ICD-10-CM | POA: Diagnosis not present

## 2018-12-08 DIAGNOSIS — Z992 Dependence on renal dialysis: Secondary | ICD-10-CM | POA: Diagnosis not present

## 2018-12-08 DIAGNOSIS — N2581 Secondary hyperparathyroidism of renal origin: Secondary | ICD-10-CM | POA: Diagnosis not present

## 2018-12-09 DIAGNOSIS — D631 Anemia in chronic kidney disease: Secondary | ICD-10-CM | POA: Diagnosis not present

## 2018-12-09 DIAGNOSIS — N186 End stage renal disease: Secondary | ICD-10-CM | POA: Diagnosis not present

## 2018-12-09 DIAGNOSIS — N2581 Secondary hyperparathyroidism of renal origin: Secondary | ICD-10-CM | POA: Diagnosis not present

## 2018-12-09 DIAGNOSIS — D509 Iron deficiency anemia, unspecified: Secondary | ICD-10-CM | POA: Diagnosis not present

## 2018-12-09 DIAGNOSIS — Z992 Dependence on renal dialysis: Secondary | ICD-10-CM | POA: Diagnosis not present

## 2018-12-10 DIAGNOSIS — D631 Anemia in chronic kidney disease: Secondary | ICD-10-CM | POA: Diagnosis not present

## 2018-12-10 DIAGNOSIS — N186 End stage renal disease: Secondary | ICD-10-CM | POA: Diagnosis not present

## 2018-12-10 DIAGNOSIS — D509 Iron deficiency anemia, unspecified: Secondary | ICD-10-CM | POA: Diagnosis not present

## 2018-12-10 DIAGNOSIS — N2581 Secondary hyperparathyroidism of renal origin: Secondary | ICD-10-CM | POA: Diagnosis not present

## 2018-12-10 DIAGNOSIS — Z992 Dependence on renal dialysis: Secondary | ICD-10-CM | POA: Diagnosis not present

## 2018-12-11 DIAGNOSIS — Z992 Dependence on renal dialysis: Secondary | ICD-10-CM | POA: Diagnosis not present

## 2018-12-11 DIAGNOSIS — N2581 Secondary hyperparathyroidism of renal origin: Secondary | ICD-10-CM | POA: Diagnosis not present

## 2018-12-11 DIAGNOSIS — D631 Anemia in chronic kidney disease: Secondary | ICD-10-CM | POA: Diagnosis not present

## 2018-12-11 DIAGNOSIS — D509 Iron deficiency anemia, unspecified: Secondary | ICD-10-CM | POA: Diagnosis not present

## 2018-12-11 DIAGNOSIS — N186 End stage renal disease: Secondary | ICD-10-CM | POA: Diagnosis not present

## 2018-12-12 DIAGNOSIS — N186 End stage renal disease: Secondary | ICD-10-CM | POA: Diagnosis not present

## 2018-12-12 DIAGNOSIS — D509 Iron deficiency anemia, unspecified: Secondary | ICD-10-CM | POA: Diagnosis not present

## 2018-12-12 DIAGNOSIS — Z992 Dependence on renal dialysis: Secondary | ICD-10-CM | POA: Diagnosis not present

## 2018-12-12 DIAGNOSIS — N2581 Secondary hyperparathyroidism of renal origin: Secondary | ICD-10-CM | POA: Diagnosis not present

## 2018-12-12 DIAGNOSIS — D631 Anemia in chronic kidney disease: Secondary | ICD-10-CM | POA: Diagnosis not present

## 2018-12-13 DIAGNOSIS — D631 Anemia in chronic kidney disease: Secondary | ICD-10-CM | POA: Diagnosis not present

## 2018-12-13 DIAGNOSIS — Z992 Dependence on renal dialysis: Secondary | ICD-10-CM | POA: Diagnosis not present

## 2018-12-13 DIAGNOSIS — D509 Iron deficiency anemia, unspecified: Secondary | ICD-10-CM | POA: Diagnosis not present

## 2018-12-13 DIAGNOSIS — N2581 Secondary hyperparathyroidism of renal origin: Secondary | ICD-10-CM | POA: Diagnosis not present

## 2018-12-13 DIAGNOSIS — N186 End stage renal disease: Secondary | ICD-10-CM | POA: Diagnosis not present

## 2018-12-14 DIAGNOSIS — D631 Anemia in chronic kidney disease: Secondary | ICD-10-CM | POA: Diagnosis not present

## 2018-12-14 DIAGNOSIS — N186 End stage renal disease: Secondary | ICD-10-CM | POA: Diagnosis not present

## 2018-12-14 DIAGNOSIS — D509 Iron deficiency anemia, unspecified: Secondary | ICD-10-CM | POA: Diagnosis not present

## 2018-12-14 DIAGNOSIS — Z992 Dependence on renal dialysis: Secondary | ICD-10-CM | POA: Diagnosis not present

## 2018-12-14 DIAGNOSIS — N2581 Secondary hyperparathyroidism of renal origin: Secondary | ICD-10-CM | POA: Diagnosis not present

## 2018-12-15 DIAGNOSIS — D509 Iron deficiency anemia, unspecified: Secondary | ICD-10-CM | POA: Diagnosis not present

## 2018-12-15 DIAGNOSIS — Z992 Dependence on renal dialysis: Secondary | ICD-10-CM | POA: Diagnosis not present

## 2018-12-15 DIAGNOSIS — N2581 Secondary hyperparathyroidism of renal origin: Secondary | ICD-10-CM | POA: Diagnosis not present

## 2018-12-15 DIAGNOSIS — D631 Anemia in chronic kidney disease: Secondary | ICD-10-CM | POA: Diagnosis not present

## 2018-12-15 DIAGNOSIS — N186 End stage renal disease: Secondary | ICD-10-CM | POA: Diagnosis not present

## 2018-12-16 DIAGNOSIS — D509 Iron deficiency anemia, unspecified: Secondary | ICD-10-CM | POA: Diagnosis not present

## 2018-12-16 DIAGNOSIS — N186 End stage renal disease: Secondary | ICD-10-CM | POA: Diagnosis not present

## 2018-12-16 DIAGNOSIS — D631 Anemia in chronic kidney disease: Secondary | ICD-10-CM | POA: Diagnosis not present

## 2018-12-16 DIAGNOSIS — N2581 Secondary hyperparathyroidism of renal origin: Secondary | ICD-10-CM | POA: Diagnosis not present

## 2018-12-16 DIAGNOSIS — Z992 Dependence on renal dialysis: Secondary | ICD-10-CM | POA: Diagnosis not present

## 2018-12-17 DIAGNOSIS — D509 Iron deficiency anemia, unspecified: Secondary | ICD-10-CM | POA: Diagnosis not present

## 2018-12-17 DIAGNOSIS — N2581 Secondary hyperparathyroidism of renal origin: Secondary | ICD-10-CM | POA: Diagnosis not present

## 2018-12-17 DIAGNOSIS — N186 End stage renal disease: Secondary | ICD-10-CM | POA: Diagnosis not present

## 2018-12-17 DIAGNOSIS — Z992 Dependence on renal dialysis: Secondary | ICD-10-CM | POA: Diagnosis not present

## 2018-12-17 DIAGNOSIS — D631 Anemia in chronic kidney disease: Secondary | ICD-10-CM | POA: Diagnosis not present

## 2018-12-18 DIAGNOSIS — D509 Iron deficiency anemia, unspecified: Secondary | ICD-10-CM | POA: Diagnosis not present

## 2018-12-18 DIAGNOSIS — N2581 Secondary hyperparathyroidism of renal origin: Secondary | ICD-10-CM | POA: Diagnosis not present

## 2018-12-18 DIAGNOSIS — N186 End stage renal disease: Secondary | ICD-10-CM | POA: Diagnosis not present

## 2018-12-18 DIAGNOSIS — D631 Anemia in chronic kidney disease: Secondary | ICD-10-CM | POA: Diagnosis not present

## 2018-12-18 DIAGNOSIS — Z992 Dependence on renal dialysis: Secondary | ICD-10-CM | POA: Diagnosis not present

## 2018-12-19 DIAGNOSIS — D631 Anemia in chronic kidney disease: Secondary | ICD-10-CM | POA: Diagnosis not present

## 2018-12-19 DIAGNOSIS — Z992 Dependence on renal dialysis: Secondary | ICD-10-CM | POA: Diagnosis not present

## 2018-12-19 DIAGNOSIS — N186 End stage renal disease: Secondary | ICD-10-CM | POA: Diagnosis not present

## 2018-12-19 DIAGNOSIS — N2581 Secondary hyperparathyroidism of renal origin: Secondary | ICD-10-CM | POA: Diagnosis not present

## 2018-12-19 DIAGNOSIS — D509 Iron deficiency anemia, unspecified: Secondary | ICD-10-CM | POA: Diagnosis not present

## 2018-12-21 ENCOUNTER — Encounter (HOSPITAL_COMMUNITY): Payer: Self-pay

## 2018-12-21 ENCOUNTER — Other Ambulatory Visit: Payer: Self-pay

## 2018-12-21 ENCOUNTER — Emergency Department (HOSPITAL_COMMUNITY): Payer: Medicare Other

## 2018-12-21 ENCOUNTER — Inpatient Hospital Stay (HOSPITAL_COMMUNITY)
Admission: EM | Admit: 2018-12-21 | Discharge: 2019-01-04 | DRG: 291 | Disposition: A | Discharge status: Home Health | Payer: Medicare Other | Attending: Internal Medicine | Admitting: Internal Medicine

## 2018-12-21 DIAGNOSIS — R1032 Left lower quadrant pain: Secondary | ICD-10-CM | POA: Diagnosis present

## 2018-12-21 DIAGNOSIS — J111 Influenza due to unidentified influenza virus with other respiratory manifestations: Secondary | ICD-10-CM | POA: Diagnosis not present

## 2018-12-21 DIAGNOSIS — E890 Postprocedural hypothyroidism: Secondary | ICD-10-CM | POA: Diagnosis present

## 2018-12-21 DIAGNOSIS — K5900 Constipation, unspecified: Secondary | ICD-10-CM | POA: Diagnosis not present

## 2018-12-21 DIAGNOSIS — N186 End stage renal disease: Secondary | ICD-10-CM | POA: Diagnosis present

## 2018-12-21 DIAGNOSIS — J101 Influenza due to other identified influenza virus with other respiratory manifestations: Secondary | ICD-10-CM | POA: Diagnosis present

## 2018-12-21 DIAGNOSIS — I890 Lymphedema, not elsewhere classified: Secondary | ICD-10-CM | POA: Diagnosis present

## 2018-12-21 DIAGNOSIS — E119 Type 2 diabetes mellitus without complications: Secondary | ICD-10-CM

## 2018-12-21 DIAGNOSIS — L03311 Cellulitis of abdominal wall: Secondary | ICD-10-CM | POA: Diagnosis present

## 2018-12-21 DIAGNOSIS — I50812 Chronic right heart failure: Secondary | ICD-10-CM | POA: Diagnosis present

## 2018-12-21 DIAGNOSIS — I2729 Other secondary pulmonary hypertension: Secondary | ICD-10-CM | POA: Diagnosis present

## 2018-12-21 DIAGNOSIS — T85691A Other mechanical complication of intraperitoneal dialysis catheter, initial encounter: Secondary | ICD-10-CM | POA: Diagnosis not present

## 2018-12-21 DIAGNOSIS — T380X5A Adverse effect of glucocorticoids and synthetic analogues, initial encounter: Secondary | ICD-10-CM | POA: Diagnosis present

## 2018-12-21 DIAGNOSIS — I2510 Atherosclerotic heart disease of native coronary artery without angina pectoris: Secondary | ICD-10-CM | POA: Diagnosis present

## 2018-12-21 DIAGNOSIS — K659 Peritonitis, unspecified: Secondary | ICD-10-CM

## 2018-12-21 DIAGNOSIS — Z881 Allergy status to other antibiotic agents status: Secondary | ICD-10-CM

## 2018-12-21 DIAGNOSIS — E7800 Pure hypercholesterolemia, unspecified: Secondary | ICD-10-CM | POA: Diagnosis present

## 2018-12-21 DIAGNOSIS — F419 Anxiety disorder, unspecified: Secondary | ICD-10-CM | POA: Diagnosis present

## 2018-12-21 DIAGNOSIS — R32 Unspecified urinary incontinence: Secondary | ICD-10-CM | POA: Diagnosis present

## 2018-12-21 DIAGNOSIS — Z992 Dependence on renal dialysis: Secondary | ICD-10-CM | POA: Diagnosis not present

## 2018-12-21 DIAGNOSIS — M79605 Pain in left leg: Secondary | ICD-10-CM | POA: Diagnosis present

## 2018-12-21 DIAGNOSIS — T85611A Breakdown (mechanical) of intraperitoneal dialysis catheter, initial encounter: Secondary | ICD-10-CM

## 2018-12-21 DIAGNOSIS — I2781 Cor pulmonale (chronic): Secondary | ICD-10-CM | POA: Diagnosis present

## 2018-12-21 DIAGNOSIS — R600 Localized edema: Secondary | ICD-10-CM

## 2018-12-21 DIAGNOSIS — Z91041 Radiographic dye allergy status: Secondary | ICD-10-CM

## 2018-12-21 DIAGNOSIS — R52 Pain, unspecified: Secondary | ICD-10-CM | POA: Diagnosis not present

## 2018-12-21 DIAGNOSIS — Z85850 Personal history of malignant neoplasm of thyroid: Secondary | ICD-10-CM

## 2018-12-21 DIAGNOSIS — E785 Hyperlipidemia, unspecified: Secondary | ICD-10-CM | POA: Diagnosis present

## 2018-12-21 DIAGNOSIS — J449 Chronic obstructive pulmonary disease, unspecified: Secondary | ICD-10-CM | POA: Diagnosis present

## 2018-12-21 DIAGNOSIS — E8889 Other specified metabolic disorders: Secondary | ICD-10-CM | POA: Diagnosis present

## 2018-12-21 DIAGNOSIS — D631 Anemia in chronic kidney disease: Secondary | ICD-10-CM | POA: Diagnosis present

## 2018-12-21 DIAGNOSIS — I10 Essential (primary) hypertension: Secondary | ICD-10-CM | POA: Diagnosis present

## 2018-12-21 DIAGNOSIS — E876 Hypokalemia: Secondary | ICD-10-CM | POA: Diagnosis present

## 2018-12-21 DIAGNOSIS — Z90710 Acquired absence of both cervix and uterus: Secondary | ICD-10-CM

## 2018-12-21 DIAGNOSIS — J9611 Chronic respiratory failure with hypoxia: Secondary | ICD-10-CM | POA: Diagnosis present

## 2018-12-21 DIAGNOSIS — R0602 Shortness of breath: Secondary | ICD-10-CM | POA: Diagnosis not present

## 2018-12-21 DIAGNOSIS — Z4682 Encounter for fitting and adjustment of non-vascular catheter: Secondary | ICD-10-CM | POA: Diagnosis not present

## 2018-12-21 DIAGNOSIS — Z9981 Dependence on supplemental oxygen: Secondary | ICD-10-CM

## 2018-12-21 DIAGNOSIS — G8929 Other chronic pain: Secondary | ICD-10-CM | POA: Diagnosis present

## 2018-12-21 DIAGNOSIS — R0902 Hypoxemia: Secondary | ICD-10-CM | POA: Diagnosis not present

## 2018-12-21 DIAGNOSIS — M793 Panniculitis, unspecified: Secondary | ICD-10-CM | POA: Diagnosis present

## 2018-12-21 DIAGNOSIS — L02211 Cutaneous abscess of abdominal wall: Secondary | ICD-10-CM | POA: Diagnosis present

## 2018-12-21 DIAGNOSIS — I132 Hypertensive heart and chronic kidney disease with heart failure and with stage 5 chronic kidney disease, or end stage renal disease: Secondary | ICD-10-CM | POA: Diagnosis present

## 2018-12-21 DIAGNOSIS — R188 Other ascites: Secondary | ICD-10-CM | POA: Diagnosis not present

## 2018-12-21 DIAGNOSIS — Z8249 Family history of ischemic heart disease and other diseases of the circulatory system: Secondary | ICD-10-CM

## 2018-12-21 DIAGNOSIS — Z79899 Other long term (current) drug therapy: Secondary | ICD-10-CM

## 2018-12-21 DIAGNOSIS — Z794 Long term (current) use of insulin: Secondary | ICD-10-CM

## 2018-12-21 DIAGNOSIS — E1122 Type 2 diabetes mellitus with diabetic chronic kidney disease: Secondary | ICD-10-CM | POA: Diagnosis present

## 2018-12-21 DIAGNOSIS — N2581 Secondary hyperparathyroidism of renal origin: Secondary | ICD-10-CM | POA: Diagnosis present

## 2018-12-21 DIAGNOSIS — N184 Chronic kidney disease, stage 4 (severe): Secondary | ICD-10-CM

## 2018-12-21 DIAGNOSIS — Z887 Allergy status to serum and vaccine status: Secondary | ICD-10-CM

## 2018-12-21 DIAGNOSIS — R509 Fever, unspecified: Secondary | ICD-10-CM | POA: Diagnosis not present

## 2018-12-21 DIAGNOSIS — M797 Fibromyalgia: Secondary | ICD-10-CM | POA: Diagnosis present

## 2018-12-21 DIAGNOSIS — R0989 Other specified symptoms and signs involving the circulatory and respiratory systems: Secondary | ICD-10-CM | POA: Diagnosis not present

## 2018-12-21 DIAGNOSIS — M79604 Pain in right leg: Secondary | ICD-10-CM

## 2018-12-21 DIAGNOSIS — Z6841 Body Mass Index (BMI) 40.0 and over, adult: Secondary | ICD-10-CM

## 2018-12-21 DIAGNOSIS — I5032 Chronic diastolic (congestive) heart failure: Secondary | ICD-10-CM | POA: Diagnosis present

## 2018-12-21 DIAGNOSIS — M79651 Pain in right thigh: Secondary | ICD-10-CM | POA: Diagnosis present

## 2018-12-21 DIAGNOSIS — R109 Unspecified abdominal pain: Secondary | ICD-10-CM

## 2018-12-21 DIAGNOSIS — E875 Hyperkalemia: Secondary | ICD-10-CM | POA: Diagnosis present

## 2018-12-21 DIAGNOSIS — G4733 Obstructive sleep apnea (adult) (pediatric): Secondary | ICD-10-CM | POA: Diagnosis present

## 2018-12-21 DIAGNOSIS — T8571XA Infection and inflammatory reaction due to peritoneal dialysis catheter, initial encounter: Secondary | ICD-10-CM | POA: Diagnosis not present

## 2018-12-21 DIAGNOSIS — Z800 Family history of malignant neoplasm of digestive organs: Secondary | ICD-10-CM

## 2018-12-21 HISTORY — DX: Dependence on supplemental oxygen: Z99.81

## 2018-12-21 LAB — PROTEIN, PLEURAL OR PERITONEAL FLUID

## 2018-12-21 LAB — CBC WITH DIFFERENTIAL/PLATELET
Abs Immature Granulocytes: 0.07 10*3/uL (ref 0.00–0.07)
Basophils Absolute: 0 10*3/uL (ref 0.0–0.1)
Basophils Relative: 0 %
Eosinophils Absolute: 0.1 10*3/uL (ref 0.0–0.5)
Eosinophils Relative: 1 %
HCT: 33.5 % — ABNORMAL LOW (ref 36.0–46.0)
HEMOGLOBIN: 10.2 g/dL — AB (ref 12.0–15.0)
Immature Granulocytes: 1 %
LYMPHS ABS: 0.8 10*3/uL (ref 0.7–4.0)
LYMPHS PCT: 7 %
MCH: 31.3 pg (ref 26.0–34.0)
MCHC: 30.4 g/dL (ref 30.0–36.0)
MCV: 102.8 fL — ABNORMAL HIGH (ref 80.0–100.0)
Monocytes Absolute: 0.5 10*3/uL (ref 0.1–1.0)
Monocytes Relative: 5 %
Neutro Abs: 9.3 10*3/uL — ABNORMAL HIGH (ref 1.7–7.7)
Neutrophils Relative %: 86 %
Platelets: 219 10*3/uL (ref 150–400)
RBC: 3.26 MIL/uL — ABNORMAL LOW (ref 3.87–5.11)
RDW: 14.2 % (ref 11.5–15.5)
WBC: 10.8 10*3/uL — ABNORMAL HIGH (ref 4.0–10.5)
nRBC: 0 % (ref 0.0–0.2)

## 2018-12-21 LAB — COMPREHENSIVE METABOLIC PANEL
ALK PHOS: 113 U/L (ref 38–126)
ALT: 21 U/L (ref 0–44)
AST: 12 U/L — ABNORMAL LOW (ref 15–41)
Albumin: 3.2 g/dL — ABNORMAL LOW (ref 3.5–5.0)
Anion gap: 15 (ref 5–15)
BUN: 42 mg/dL — ABNORMAL HIGH (ref 6–20)
CO2: 26 mmol/L (ref 22–32)
Calcium: 8.4 mg/dL — ABNORMAL LOW (ref 8.9–10.3)
Chloride: 91 mmol/L — ABNORMAL LOW (ref 98–111)
Creatinine, Ser: 9.72 mg/dL — ABNORMAL HIGH (ref 0.44–1.00)
GFR calc Af Amer: 5 mL/min — ABNORMAL LOW (ref >60)
GFR, EST NON AFRICAN AMERICAN: 4 mL/min — AB (ref >60)
Glucose, Bld: 200 mg/dL — ABNORMAL HIGH (ref 70–99)
Potassium: 2.3 mmol/L — CL (ref 3.5–5.1)
Sodium: 132 mmol/L — ABNORMAL LOW (ref 135–145)
Total Bilirubin: 0.4 mg/dL (ref 0.3–1.2)
Total Protein: 7.6 g/dL (ref 6.5–8.1)

## 2018-12-21 LAB — LIPASE, BLOOD: LIPASE: 66 U/L — AB (ref 11–51)

## 2018-12-21 LAB — GRAM STAIN

## 2018-12-21 LAB — MRSA PCR SCREENING: MRSA by PCR: NEGATIVE

## 2018-12-21 LAB — CBC
HCT: 30.1 % — ABNORMAL LOW (ref 36.0–46.0)
Hemoglobin: 9.4 g/dL — ABNORMAL LOW (ref 12.0–15.0)
MCH: 31.4 pg (ref 26.0–34.0)
MCHC: 31.2 g/dL (ref 30.0–36.0)
MCV: 100.7 fL — AB (ref 80.0–100.0)
PLATELETS: 203 10*3/uL (ref 150–400)
RBC: 2.99 MIL/uL — ABNORMAL LOW (ref 3.87–5.11)
RDW: 14 % (ref 11.5–15.5)
WBC: 10.4 10*3/uL (ref 4.0–10.5)
nRBC: 0 % (ref 0.0–0.2)

## 2018-12-21 LAB — LACTATE DEHYDROGENASE, PLEURAL OR PERITONEAL FLUID: LD, Fluid: <5 U/L (ref 3–23)

## 2018-12-21 LAB — BODY FLUID CELL COUNT WITH DIFFERENTIAL: Total Nucleated Cell Count, Fluid: 4 cu mm (ref 0–1000)

## 2018-12-21 LAB — CREATININE, SERUM
CREATININE: 10.48 mg/dL — AB (ref 0.44–1.00)
GFR calc Af Amer: 4 mL/min — ABNORMAL LOW (ref >60)
GFR calc non Af Amer: 4 mL/min — ABNORMAL LOW (ref >60)

## 2018-12-21 LAB — GLUCOSE, PLEURAL OR PERITONEAL FLUID: Glucose, Fluid: 605 mg/dL

## 2018-12-21 LAB — ALBUMIN, PLEURAL OR PERITONEAL FLUID: Albumin, Fluid: <1 g/dL

## 2018-12-21 LAB — INFLUENZA PANEL BY PCR (TYPE A & B)
Influenza A By PCR: NEGATIVE
Influenza B By PCR: POSITIVE — AB

## 2018-12-21 LAB — GLUCOSE, CAPILLARY: GLUCOSE-CAPILLARY: 150 mg/dL — AB (ref 70–99)

## 2018-12-21 LAB — MAGNESIUM: Magnesium: 1.7 mg/dL (ref 1.7–2.4)

## 2018-12-21 LAB — CBG MONITORING, ED: Glucose-Capillary: 152 mg/dL — ABNORMAL HIGH (ref 70–99)

## 2018-12-21 MED ORDER — HEPARIN 1000 UNIT/ML FOR PERITONEAL DIALYSIS
INTRAPERITONEAL | Status: DC | PRN
  Filled 2018-12-21: qty 5000

## 2018-12-21 MED ORDER — EXTRANEAL 7.5 % IP SOLN
INTRAPERITONEAL | Status: DC
  Filled 2018-12-21 (×2): qty 2500

## 2018-12-21 MED ORDER — DELFLEX-LC/2.5% DEXTROSE 394 MOSM/L IP SOLN
INTRAPERITONEAL | Status: DC
  Administered 2018-12-21: 19:00:00 via INTRAPERITONEAL

## 2018-12-21 MED ORDER — SODIUM CHLORIDE 0.9% FLUSH
3.0000 mL | INTRAVENOUS | Status: DC | PRN
  Administered 2018-12-30: 3 mL via INTRAVENOUS
  Filled 2018-12-21: qty 3

## 2018-12-21 MED ORDER — OXYCODONE HCL 5 MG PO TABS
10.0000 mg | ORAL_TABLET | Freq: Once | ORAL | Status: AC
  Administered 2018-12-21: 10 mg via ORAL

## 2018-12-21 MED ORDER — OSELTAMIVIR PHOSPHATE 30 MG PO CAPS: 30.0000 mg | ORAL_CAPSULE | Freq: Every day | ORAL | Status: DC

## 2018-12-21 MED ORDER — HEPARIN 1000 UNIT/ML FOR PERITONEAL DIALYSIS: 500.0000 [IU] | INTRAMUSCULAR | Status: DC | PRN

## 2018-12-21 MED ORDER — POTASSIUM CHLORIDE 10 MEQ/100ML IV SOLN
10.0000 meq | INTRAVENOUS | Status: AC
  Administered 2018-12-21 (×2): 10 meq via INTRAVENOUS
  Filled 2018-12-21 (×2): qty 100

## 2018-12-21 MED ORDER — RENA-VITE PO TABS
1.0000 | ORAL_TABLET | Freq: Every day | ORAL | Status: DC
  Administered 2018-12-21 – 2019-01-04 (×15): 1 via ORAL
  Filled 2018-12-21 (×15): qty 1

## 2018-12-21 MED ORDER — LANTHANUM CARBONATE 500 MG PO CHEW: 1000.0000 mg | CHEWABLE_TABLET | Freq: Two times a day (BID) | ORAL | Status: DC | PRN

## 2018-12-21 MED ORDER — SODIUM CHLORIDE 0.9 % IV SOLN: 250.0000 mL | INTRAVENOUS | Status: DC | PRN

## 2018-12-21 MED ORDER — INSULIN DETEMIR 100 UNIT/ML ~~LOC~~ SOLN
10.0000 [IU] | Freq: Every day | SUBCUTANEOUS | Status: DC
  Administered 2018-12-22 – 2019-01-02 (×12): 10 [IU] via SUBCUTANEOUS
  Filled 2018-12-21 (×12): qty 0.1

## 2018-12-21 MED ORDER — HEPARIN SODIUM (PORCINE) 5000 UNIT/ML IJ SOLN
5000.0000 [IU] | Freq: Three times a day (TID) | INTRAMUSCULAR | Status: DC
  Administered 2018-12-21 – 2019-01-04 (×37): 5000 [IU] via SUBCUTANEOUS
  Filled 2018-12-21 (×28): qty 1

## 2018-12-21 MED ORDER — OXYCODONE HCL 5 MG PO TABS
10.0000 mg | ORAL_TABLET | Freq: Four times a day (QID) | ORAL | Status: DC | PRN
  Administered 2018-12-21 – 2019-01-03 (×27): 10 mg via ORAL
  Filled 2018-12-21 (×28): qty 2

## 2018-12-21 MED ORDER — OXYCODONE HCL 5 MG PO TABS
ORAL_TABLET | ORAL | Status: AC
  Filled 2018-12-21: qty 2

## 2018-12-21 MED ORDER — ACETAMINOPHEN 325 MG PO TABS: 650.0000 mg | ORAL_TABLET | Freq: Four times a day (QID) | ORAL | Status: DC | PRN

## 2018-12-21 MED ORDER — AMLODIPINE BESYLATE 5 MG PO TABS
5.0000 mg | ORAL_TABLET | Freq: Every day | ORAL | Status: DC
  Administered 2018-12-22 – 2019-01-01 (×10): 5 mg via ORAL
  Filled 2018-12-21 (×11): qty 1

## 2018-12-21 MED ORDER — POTASSIUM CHLORIDE 10 MEQ/100ML IV SOLN: 10.0000 meq | INTRAVENOUS | Status: DC

## 2018-12-21 MED ORDER — SODIUM CHLORIDE 0.9 % IV SOLN
2.0000 g | Freq: Once | INTRAVENOUS | Status: DC
  Filled 2018-12-21: qty 2

## 2018-12-21 MED ORDER — POLYETHYLENE GLYCOL 3350 17 G PO PACK
17.0000 g | PACK | Freq: Every day | ORAL | Status: DC | PRN
  Administered 2018-12-24 – 2018-12-29 (×3): 17 g via ORAL
  Filled 2018-12-21 (×3): qty 1

## 2018-12-21 MED ORDER — DELFLEX-LC/2.5% DEXTROSE 394 MOSM/L IP SOLN: INTRAPERITONEAL | Status: DC

## 2018-12-21 MED ORDER — OXYCODONE HCL 5 MG PO TABS: 15.0000 mg | ORAL_TABLET | Freq: Once | ORAL | Status: DC

## 2018-12-21 MED ORDER — POLYETHYLENE GLYCOL 3350 17 G PO PACK
17.0000 g | PACK | Freq: Three times a day (TID) | ORAL | Status: AC
  Administered 2018-12-21 – 2018-12-23 (×6): 17 g via ORAL
  Filled 2018-12-21 (×6): qty 1

## 2018-12-21 MED ORDER — OSELTAMIVIR PHOSPHATE 30 MG PO CAPS
30.0000 mg | ORAL_CAPSULE | Freq: Once | ORAL | Status: AC
  Administered 2018-12-21: 30 mg via ORAL
  Filled 2018-12-21: qty 1

## 2018-12-21 MED ORDER — AMLODIPINE BESYLATE 5 MG PO TABS: 5.0000 mg | ORAL_TABLET | Freq: Every day | ORAL | Status: DC

## 2018-12-21 MED ORDER — DIPHENHYDRAMINE HCL 25 MG PO CAPS: 25.0000 mg | ORAL_CAPSULE | Freq: Four times a day (QID) | ORAL | Status: DC | PRN

## 2018-12-21 MED ORDER — POTASSIUM CHLORIDE CRYS ER 20 MEQ PO TBCR
40.0000 meq | EXTENDED_RELEASE_TABLET | Freq: Every day | ORAL | Status: DC
  Administered 2018-12-22 – 2019-01-01 (×11): 40 meq via ORAL
  Filled 2018-12-21 (×11): qty 2

## 2018-12-21 MED ORDER — PROMETHAZINE HCL 25 MG PO TABS
12.5000 mg | ORAL_TABLET | Freq: Four times a day (QID) | ORAL | Status: DC | PRN
  Administered 2018-12-22 – 2019-01-04 (×6): 12.5 mg via ORAL
  Filled 2018-12-21 (×7): qty 1

## 2018-12-21 MED ORDER — ALBUTEROL SULFATE (2.5 MG/3ML) 0.083% IN NEBU: 2.5000 mg | INHALATION_SOLUTION | Freq: Four times a day (QID) | RESPIRATORY_TRACT | Status: DC | PRN

## 2018-12-21 MED ORDER — INSULIN ASPART 100 UNIT/ML ~~LOC~~ SOLN
0.0000 [IU] | Freq: Three times a day (TID) | SUBCUTANEOUS | Status: DC
  Administered 2018-12-21 – 2018-12-25 (×8): 3 [IU] via SUBCUTANEOUS
  Administered 2018-12-25 – 2018-12-26 (×2): 4 [IU] via SUBCUTANEOUS
  Administered 2018-12-26: 3 [IU] via SUBCUTANEOUS
  Administered 2018-12-27 (×2): 7 [IU] via SUBCUTANEOUS
  Administered 2018-12-27 – 2018-12-28 (×2): 11 [IU] via SUBCUTANEOUS
  Administered 2018-12-28: 4 [IU] via SUBCUTANEOUS
  Administered 2018-12-28 – 2018-12-31 (×5): 3 [IU] via SUBCUTANEOUS
  Administered 2019-01-01: 7 [IU] via SUBCUTANEOUS
  Administered 2019-01-01 – 2019-01-02 (×2): 11 [IU] via SUBCUTANEOUS
  Administered 2019-01-02: 4 [IU] via SUBCUTANEOUS
  Administered 2019-01-02: 20 [IU] via SUBCUTANEOUS
  Administered 2019-01-03: 7 [IU] via SUBCUTANEOUS
  Administered 2019-01-03: 4 [IU] via SUBCUTANEOUS
  Administered 2019-01-04: 3 [IU] via SUBCUTANEOUS

## 2018-12-21 MED ORDER — LANTHANUM CARBONATE 500 MG PO CHEW
1500.0000 mg | CHEWABLE_TABLET | Freq: Three times a day (TID) | ORAL | Status: DC
  Administered 2018-12-22 – 2019-01-03 (×36): 1500 mg via ORAL
  Filled 2018-12-21 (×37): qty 3

## 2018-12-21 MED ORDER — ALPRAZOLAM 0.5 MG PO TABS: 0.5000 mg | ORAL_TABLET | Freq: Three times a day (TID) | ORAL | Status: DC | PRN

## 2018-12-21 MED ORDER — SODIUM CHLORIDE 0.9% FLUSH
3.0000 mL | Freq: Two times a day (BID) | INTRAVENOUS | Status: DC
  Administered 2018-12-21 – 2019-01-04 (×22): 3 mL via INTRAVENOUS

## 2018-12-21 MED ORDER — GENTAMICIN SULFATE 0.1 % EX CREA
1.0000 "application " | TOPICAL_CREAM | Freq: Every day | CUTANEOUS | Status: DC
  Administered 2018-12-21 – 2018-12-22 (×3): 1 via TOPICAL
  Filled 2018-12-21: qty 15

## 2018-12-21 MED ORDER — CEFAZOLIN SODIUM-DEXTROSE 1-4 GM/50ML-% IV SOLN
1.0000 g | Freq: Once | INTRAVENOUS | Status: DC
  Filled 2018-12-21: qty 50

## 2018-12-21 NOTE — ED Triage Notes (Signed)
Pt called out due to pain in lower extremities and increased SOB. Dialysis pt and had it last night. Does it at home. Legs have doubled in size she states. EMS given 4 morphine and 4 of Zofran. Pain initially a 10/10 now a 6/10.   20 G Right AC. NAD  On 2L O2 Chronically

## 2018-12-21 NOTE — Progress Notes (Signed)
New Admission Note: patient admitted from [place] ED to [facility] 5M22  Arrival Method: via carelink Mental Orientation: alert and oriented x 4 Telemetry: Placed on box 19 Assessment: Completed Skin: Dry/scaly to abdomen and L LE IV: R AC NSL Pain: with movement Tubes: None Safety Measures: Safety Fall Prevention Plan has been discussed  Admission: To be completed 5 [place] Orientation: Patient has been orientated to the room, unit and staff.   Family: None at bedside  Orders to be reviewed and implemented. Will continue to monitor the patient. Call light has been placed within reach and bed alarm has been activated.   Mady Gemma, BSN, RN 3-BC Phone: [phone]

## 2018-12-21 NOTE — ED Notes (Signed)
Pt has chronic and severe lymph edema. Am not able to palpate a pulse on left foot due to extensive edema

## 2018-12-21 NOTE — ED Notes (Signed)
CRITICAL VALUE ALERT  Critical Value: K 2.3   Date & Time Notied:  12/21/18  Provider Notified: Phylliss Bob, [place]   Orders Received/Actions taken: None yet

## 2018-12-21 NOTE — ED Notes (Signed)
Report given to [facility]

## 2018-12-21 NOTE — ED Notes (Signed)
Date and time results received: 12/21/18 11:56 AM  (use smartphrase ".now" to insert current time)  Test: Potassium Critical Value: 2.3  Name of Provider Notified: Benjamine Mola PA  Orders Received? Or Actions Taken?: Orders Received - See Orders for details

## 2018-12-21 NOTE — ED Provider Notes (Signed)
[facility] EMERGENCY DEPARTMENT Provider Note   CSN: [id number] Arrival date & time: 12/21/18  0945     History   Chief Complaint Chief Complaint  Patient presents with  . Cellulitis    HPI [name] is a 53 y.o. female with a past medical history of thyroid cancer, DM 2, CHF, pulmonary hypertension, ESRD on PD, hypertension, CAD, who presents today for evaluation of pain in her leg and stomach.  She reports that she started feeling poorly on Christmas eve however her grandchildren came to see her and she did not get evaluated.  She reports that over the past few days she has had fevers over 101 intermittently.  She says that she has been doing her PD every night without difficulty.  She has a history of cellulitis in her bilateral lower extremities and reports that both of them are very painful.  She reports pain in the bilateral lower abdomen.  She says that normally she makes a small amount of urine about once a day.  She has not been eating and drinking well and has not been making much urine recently, however this morning when she urinated she says that it was gray.  She denies N/V/D.    HPI  Past Medical History:  Diagnosis Date  . Anxiety   . Asthma    as a child  . Cancer ([place])    thyroid  . Cellulitis   . CHF (congestive heart failure) ([place])   . Chiari malformation    s/p surgery  . Chiari malformation   . Chronic pain   . Complication of anesthesia 11/28/15   Resp arrest after  conscious  sedation  . Constipation   . Coronary artery disease    40-50% mid LAD 04/29/09, Medical tx. (Dr. Gwenlyn Found)  . Diabetes mellitus    Type II- reports being off all medication d/t it being controlled  . Fibromyalgia   . History of blood transfusion    hemorrage duinrg pregancy  . Hx of echocardiogram 10/2011   EF 55-60%  . Hypercholesterolemia   . Hypertension   . Lymph edema   . Obesity hypoventilation syndrome ([place])   . Pneumonia    in past  . Pulmonary hypertension  ([place])   . Renal disorder    M/W/F [facility] Pt started dialysis in Dec.2016  . S/P colonoscopy 05/26/2007   Dr. Laural Golden sigmoid diverticulosis random biopsies benign  . S/P endoscopy 05/01/2009   Dr. Penelope Coop pill-induced esophageal ulcerations distal to midesophagus, 2 small ulcers in the antrum of the stomach  . Shortness of breath dyspnea    with any exertion or if heart rate  is irregular while on dialysis  . Sleep apnea    reports that she no longer needs CPAP due to weight loss    Patient Active Problem List   Diagnosis Date Noted  . Peritonitis ([place]) 12/21/2018  . SOB (shortness of breath) 06/27/2018  . Hypokalemia 06/27/2018  . Chronic renal failure   . Septic shock ([place]) 01/24/2017  . Shock liver 01/24/2017  . Cor pulmonale, chronic ([place]) 01/24/2017  . Elevated troponin   . Flu-like symptoms 01/18/2017  . Fever 01/18/2017  . HCAP (healthcare-associated pneumonia) 12/31/2016  . Numbness and tingling in right hand 08/13/2016  . Sepsis ([place]) 08/11/2016  . Coagulase negative Staphylococcus bacteremia 06/11/2016  . UTI (lower urinary tract infection) 06/06/2016  . ESRD (end stage renal disease) ([place]) 06/06/2016  . Chronic cholecystitis 12/09/2015  . Protein-calorie malnutrition, severe  11/29/2015  . Respiratory arrest ([place]) 11/28/2015  . Recurrent cellulitis of lower leg 11/13/2015  . Chronic diastolic CHF (congestive heart failure) ([place]) 11/13/2015  . Chronic respiratory failure with hypoxia ([place]) 11/13/2015  . ESRD on dialysis ([place]) 11/13/2015  . Anemia of chronic disease 11/13/2015  . Rotator cuff syndrome of right shoulder 05/30/2013  . Pain in joint, shoulder region 05/30/2013  . Obesity hypoventilation syndrome ([place]) 09/04/2012  . Leukopenia 11/11/2011  . HTN (hypertension) 11/11/2011  . HLD (hyperlipidemia) 11/11/2011  . Transaminitis 11/11/2011  . Diabetes mellitus ([place]) 10/27/2011  . Morbid obesity ([place]) 10/27/2011  . Lymphedema of lower extremity  10/04/2011    Past Surgical History:  Procedure Laterality Date  . Marland KitchenHemodialysis catheter Right 11/28/2015  . A/V FISTULAGRAM N/A 07/26/2017   Procedure: A/V Fistulagram - Left Arm;  Surgeon: Serafina Mitchell, MD;  Location: [place] [facility];  Service: Cardiovascular;  Laterality: N/A;  . ABDOMINAL HYSTERECTOMY    . ADENOIDECTOMY    . AV FISTULA PLACEMENT Left 03/15/2016   Procedure: CREATION OF LEFT ARM ARTERIOVENOUS (AV) FISTULA  ;  Surgeon: Rosetta Posner, MD;  Location: [place];  Service: Vascular;  Laterality: Left;  . CAPD INSERTION N/A 08/30/2017   Procedure: LAPAROSCOPIC INSERTION CONTINUOUS AMBULATORY PERITONEAL DIALYSIS  (CAPD) CATHETER;  Surgeon: Clovis Riley, MD;  Location: [place];  Service: General;  Laterality: N/A;  . CESAREAN SECTION      x 2  . CRANIECTOMY SUBOCCIPITAL W/ CERVICAL LAMINECTOMY / CHIARI    . FISTULA SUPERFICIALIZATION Left 05/10/2016   Procedure: Left Arm FISTULA SUPERFICIALIZATION;  Surgeon: Rosetta Posner, MD;  [facility] OR;  Service: Vascular;  Laterality: Left;  . IR GENERIC HISTORICAL  08/14/2016   IR REMOVAL TUN ACCESS W/ PORT W/O FL MOD SED 08/14/2016 Arne Cleveland, MD [facility]-INTERV RAD  . PERIPHERAL VASCULAR CATHETERIZATION Left 11/25/2016   Procedure: A/V Fistulagram;  Surgeon: Conrad Cheviot, MD;  Location: [place] [facility];  Service: Cardiovascular;  Laterality: Left;  arm  . PORTACATH PLACEMENT  07/05/2012   Procedure: INSERTION PORT-A-CATH;  Surgeon: Donato Heinz, MD;  Location: [facility];  Service: General;  Laterality: Left;  subclavian  . portacath removal    . RIGHT HEART CATH N/A 08/04/2017   Procedure: RIGHT HEART CATH;  Surgeon: Jolaine Artist, MD;  Location: [place] [facility];  Service: Cardiovascular;  Laterality: N/A;  . THYROIDECTOMY, PARTIAL    . TONSILLECTOMY       OB History    Gravida  4   Para  3   Term  2   Preterm  1   AB  1   Living        SAB  1   TAB      Ectopic      Multiple      Live Births                 Home Medications    Prior to Admission medications   Medication Sig Start Date End Date Taking? Authorizing Provider  acetaminophen (TYLENOL) 325 MG tablet Take 650 mg by mouth every 6 (six) hours as needed for moderate pain or fever.    Yes [provider]  albuterol (PROVENTIL) (2.5 MG/3ML) 0.083% nebulizer solution Take 2.5 mg by nebulization every 6 (six) hours as needed for wheezing or shortness of breath.   Yes [provider]  ALPRAZolam Duanne Moron) 0.5 MG tablet Take 0.5 mg by mouth 3 (three) times  daily as needed for anxiety or sleep.    Yes [provider]  amLODipine (NORVASC) 10 MG tablet Take 0.5 tablets (5 mg total) by mouth daily. 06/11/16  Yes [name], MD  diphenhydrAMINE (BENADRYL) 25 mg capsule Take 25 mg by mouth every 6 (six) hours as needed for allergies.   Yes [provider]  insulin detemir (LEVEMIR) 100 UNIT/ML injection Inject 10 Units into the skin daily.   Yes [provider]  lanthanum (FOSRENOL) 500 MG chewable tablet Chew 1,000-1,500 mg by mouth 5 (five) times daily. Patient takes 3 tablets by mouth three times a day with meals and 2 tablets by mouth twice a day with snacks   Yes [provider]  multivitamin (RENA-VIT) TABS tablet Take 1 tablet by mouth daily. 05/30/18  Yes [provider]  Oxycodone HCl 10 MG TABS Take 1 tablet (10 mg total) by mouth 4 (four) times daily as needed (pain). 02/01/17  Yes [name], MD  potassium chloride 20 MEQ TBCR Take 40 mEq by mouth daily. 06/30/18  Yes Oswald Hillock, MD  blood glucose meter kit and supplies KIT Dispense based on patient and insurance preference. Use up to four times daily as directed. (FOR ICD-9 250.00, 250.01). 06/30/18   Oswald Hillock, MD  insulin lispro (HUMALOG) 100 UNIT/ML injection Inject 60 Units into the skin 2 (two) times daily.    03/08/12  [provider]    Family History Family History  Problem Relation Age of  Onset  . Colon cancer Mother 74  . Stroke Mother 64  . Coronary artery disease Mother     Social History Social History   Tobacco Use  . Smoking status: Never Smoker  . Smokeless tobacco: Never Used  Substance Use Topics  . Alcohol use: No    Alcohol/week: 0.0 standard drinks  . Drug use: No     Allergies   Contrast media [iodinated diagnostic agents]; Pneumococcal vaccines; and Vancomycin   Review of Systems Review of Systems  Constitutional: Positive for chills, fatigue and fever.  HENT: Negative for congestion.   Respiratory: Positive for shortness of breath. Negative for cough and chest tightness.   Cardiovascular: Positive for leg swelling (Bilaterally). Negative for chest pain.  Gastrointestinal: Positive for abdominal pain. Negative for constipation, diarrhea, nausea and vomiting.  Musculoskeletal: Positive for arthralgias and myalgias. Negative for back pain and neck pain.  Neurological: Positive for light-headedness (Had to turn up oxygen. ).  Psychiatric/Behavioral: Negative for confusion.  All other systems reviewed and are negative.    Physical Exam Updated Vital Signs BP 117/72   Pulse 69   Temp 98.1 F (36.7 C) (Rectal)   Resp 17   Ht _0  (1.651 m)   Wt (!) 159.4 kg   SpO2 97%   BMI 58.48 kg/m   Physical Exam Vitals signs and nursing note reviewed.  Constitutional:      General: She is not in acute distress.    Appearance: She is well-developed. She is obese.     Comments: Exam limited by body habitus.   HENT:     Head: Normocephalic and atraumatic.     Mouth/Throat:     Mouth: Mucous membranes are moist.  Eyes:     Conjunctiva/sclera: Conjunctivae normal.  Neck:     Musculoskeletal: Normal range of motion and neck supple.  Cardiovascular:     Rate and Rhythm: Normal rate and regular rhythm.     Heart sounds: Normal heart sounds. No  murmur.  Pulmonary:     Effort: Pulmonary effort is normal. No respiratory distress.     Breath  sounds: Normal breath sounds. No stridor.     Comments: Exam limited by body habitus Abdominal:     Palpations: Abdomen is soft.     Tenderness: There is abdominal tenderness (Mostly bilateral lower abdomen, however also upper. ). There is rebound.     Hernia: No hernia is present.     Comments: PD site in right lower quadrant. Abdominal pain is significant even with light palpation in all 4 quadrants.   Musculoskeletal:     Right lower leg: Edema present.     Left lower leg: Edema present.     Comments: There is significant swelling to bilateral lower extremities with multiple chronic skin changes.  Lymphadenopathy:     Cervical: No cervical adenopathy.  Skin:    General: Skin is warm and dry.     Comments: Bilateral lower extremity edema.  Left leg has significant lymphedema with chronic skin changes.  No obvious purulent drainage however there is generalized skin maceration in between folds.  Please see clinical images.   Neurological:     General: No focal deficit present.     Mental Status: She is alert and oriented to person, place, and time.  Psychiatric:        Mood and Affect: Mood normal.        Behavior: Behavior normal.          ED Treatments / Results  Labs (all labs ordered are listed, but only abnormal results are displayed) Labs Reviewed  INFLUENZA PANEL BY PCR (TYPE A & B) - Abnormal; Notable for the following components:      Result Value   Influenza B By PCR POSITIVE (*)    All other components within normal limits  CBC WITH DIFFERENTIAL/PLATELET - Abnormal; Notable for the following components:   WBC 10.8 (*)    RBC 3.26 (*)    Hemoglobin 10.2 (*)    HCT 33.5 (*)    MCV 102.8 (*)    Neutro Abs 9.3 (*)    All other components within normal limits  COMPREHENSIVE METABOLIC PANEL - Abnormal; Notable for the following components:   Sodium 132 (*)    Potassium 2.3 (*)    Chloride 91 (*)    Glucose, Bld 200 (*)    BUN 42 (*)    Creatinine, Ser 9.72 (*)     Calcium 8.4 (*)    Albumin 3.2 (*)    AST 12 (*)    GFR calc non Af Amer 4 (*)    GFR calc Af Amer 5 (*)    All other components within normal limits  LIPASE, BLOOD - Abnormal; Notable for the following components:   Lipase 66 (*)    All other components within normal limits  CBG MONITORING, ED - Abnormal; Notable for the following components:   Glucose-Capillary 152 (*)    All other components within normal limits  CULTURE, BLOOD (ROUTINE X 2)  CULTURE, BLOOD (ROUTINE X 2)  URINE CULTURE  BODY FLUID CULTURE  MAGNESIUM  URINALYSIS, ROUTINE W REFLEX MICROSCOPIC  LACTATE DEHYDROGENASE, PLEURAL OR PERITONEAL FLUID  GLUCOSE, PLEURAL OR PERITONEAL FLUID  PROTEIN, PLEURAL OR PERITONEAL FLUID  ALBUMIN, PLEURAL OR PERITONEAL FLUID  BODY FLUID CELL COUNT WITH DIFFERENTIAL    EKG EKG Interpretation  Date/Time:  Thursday December 21 2018 09:55:56 EST Ventricular Rate:  68 PR Interval:    QRS  Duration: 102 QT Interval:  574 QTC Calculation: 611 R Axis:   102 Text Interpretation:  Sinus rhythm Probable inferior infarct, old Lateral leads are also involved Prolonged QT interval Baseline wander in lead(s) II III aVF V1 V4 No significant change since last tracing Confirmed by Orlie Dakin [phone]) on 12/21/2018 9:58:29 AM   Radiology Dg Chest Portable 1 View  Result Date: 12/21/2018 CLINICAL DATA:  Shortness of Breath EXAM: PORTABLE CHEST 1 VIEW COMPARISON:  06/27/2018 FINDINGS: Cardiomegaly with vascular congestion. Perihilar opacities could reflect early interstitial edema. No effusions or acute bony abnormality. IMPRESSION: Cardiomegaly with vascular congestion and possible early perihilar edema. Electronically Signed   By: Rolm Baptise M.D.   On: 12/21/2018 10:49    Procedures .Critical Care Performed by: Lorin Glass, PA-C Authorized by: Lorin Glass, PA-C   Critical care provider statement:    Critical care time (minutes):  45   Critical care was time spent  personally by me on the following activities:  Discussions with consultants, evaluation of patient's response to treatment, examination of patient, ordering and performing treatments and interventions, ordering and review of laboratory studies, ordering and review of radiographic studies, pulse oximetry, re-evaluation of patient's condition, obtaining history from patient or surrogate and review of old charts Comments:     Transfer to a higher level of care. Multiple consultants.    (including critical care time)  Medications Ordered in ED Medications    potassium chloride 10 mEq in 100 mL IVPB (10 mEq Intravenous New Bag/Given 12/21/18 1414)  oseltamivir (TAMIFLU) capsule 30 mg (30 mg Oral Given 12/21/18 1307)       Initial Impression / Assessment and Plan / ED Course  I have reviewed the triage vital signs and the nursing notes.  Pertinent labs & imaging results that were available during my care of the patient were reviewed by me and considered in my medical decision making (see chart for details).  Clinical Course as of Dec 21 1598  Thu Dec 21, 2018  1014 Attempted to call [facility], patients PD rn at [phone] with no answer.    [EH]  89 Paged Dr. Lowanda Foster, patient's nephrologist.     [EH]  1144 Spoke with Stanton Kidney, patient's PD RN.     [EH]  1158 Potassium(!!): 2.3 [EH]  1202 K ordered  Potassium(!!): 2.3 [EH]  1205 Asked charge RN to get dialysis RN here to pull dialysis labs.    [EH]  7793 Unable to draw off peritoneal catheter here.    [EH]  [phone]   [EH]  1216 Attempted to Call Dr. Hinda Lenis at cell number provided by his office.  No answer, on his cell number and the voice mail box is full.     [EH]  1247 Spoke with Hospitalist who will admit patient.    [EH]  1251 Paged Dr. Carolin Sicks Nephrology on call per request of hospitalist.    [EH]  1321 Re-paged nephrology.    [EH]  1330 Spoke with Dr. Carolin Sicks from nephrology.  He states that, given patient is flu  positive recommends holding off on antibiotics until cell count from peritoneal culture comes back.  He also recommends giving her 40 p.o. potassium in addition to the IV potassium.   [EH]    Clinical Course User Index [EH] Lorin Glass, PA-C   Patient presents today for evaluation of leg and abdominal swelling.  She has had fevers recently, is currently afebrile here.  She gets peritoneal dialysis.  Her  abdomen was generally tender to palpation raising concern for peritonitis.  Her flu swab was positive for influenza B.  Her chest x-ray showed concern for edema without overt pneumonia or pneumothorax.  She was started on Tamiflu given her multiple comorbidities.  Labs were obtained and reviewed, significant for hypokalemia with a potassium of 2.3.  IV potassium was ordered.  Creatinine is elevated at 9.72.  Patient reports compliance with her at home dialysis.  White count is mildly elevated at 10.8 with a hemoglobin of 10.2.  I spoke with Dr.Bhandari from nephrology.  We are unable to pull off peritoneal dialysis fluid here at [facility], and patient will require transfer to [facility] so that that can be done safely.  He also recommends that, given she is flu be positive, to hold off as she is not septic, on antibiotics until peritoneal fluid cell count is obtained.    This patient was seen as a shared visit with Dr. Cathleen Fears.  Blood cultures were ordered.  I spoke with hospitalist Dr. Wynetta Emery who admitted patient.  Patient will be transferred by [facility] to [facility].  Final Clinical Impressions(s) / ED Diagnoses   Final diagnoses:  Influenza B  Peritonitis [facility])  Hypokalemia    ED Discharge Orders    None       Ollen Gross 12/21/18 1609    Orlie Dakin, MD 12/21/18 [phone]

## 2018-12-21 NOTE — ED Notes (Signed)
[facility] took pt to [facility]

## 2018-12-21 NOTE — Progress Notes (Signed)
PD tx initiated via tenckhoff w/o problem VSS Report given to Mady Gemma, RN

## 2018-12-21 NOTE — ED Notes (Signed)
Tried drawing blood x1. Unsuccessful.

## 2018-12-21 NOTE — H&P (Signed)
History and Physical  ANNA-MARIE COLLER ZOX:[id number] DOB: September 26, 1966 DOA: 12/21/2018  Referring physician: Phylliss Bob  PCP: [name], MD   Chief Complaint: bilateral leg pain and abdominal pain  HPI: [name] is a 53 y.o. female with ESRD on PD presented to ED by EMS with complaints of 10/10 pain in bilateral legs and abdomen.  She feels like she might have a recurrence of cellulitis in her legs which she has had multiple times in the past. She reports that she had dialysis at home last night.  She reports that her legs have increased in size recently.  She has chronic lymphedema in both legs which is severe.  She reports abdominal pain in LLQ below her PD cath site.  She reports intermittent fever at home but no chills.  She denies nausea and vomiting.  She has been concerned about blood clots in legs as she is very immobile.    ED Course: Pt was evaluated in ED and noted to be afebrile with normal vitals.  She was noted to have a mildly elevated WBC at 10.8.  Hg 10.2 which is stable for her.  Flu B test was positive and she was placed on tamiflu and droplet precautions.  Nephrology was consulted about concern for peritonitis and will consult.  Pt will be admitted to [facility] for further management as we do not offer PD at this facility.      Review of Systems: All systems reviewed and apart from history of presenting illness, are negative.  Past Medical History:  Diagnosis Date  . Anxiety   . Asthma    as a child  . Cancer ([place])    thyroid  . Cellulitis   . CHF (congestive heart failure) ([place])   . Chiari malformation    s/p surgery  . Chiari malformation   . Chronic pain   . Complication of anesthesia 11/28/15   Resp arrest after  conscious  sedation  . Constipation   . Coronary artery disease    40-50% mid LAD 04/29/09, Medical tx. (Dr. Gwenlyn Found)  . Diabetes mellitus    Type II- reports being off all medication d/t it being controlled  . Fibromyalgia   .  History of blood transfusion    hemorrage duinrg pregancy  . Hx of echocardiogram 10/2011   EF 55-60%  . Hypercholesterolemia   . Hypertension   . Lymph edema   . Obesity hypoventilation syndrome ([place])   . Pneumonia    in past  . Pulmonary hypertension ([place])   . Renal disorder    M/W/F [facility] Pt started dialysis in Dec.2016  . S/P colonoscopy 05/26/2007   Dr. Laural Golden sigmoid diverticulosis random biopsies benign  . S/P endoscopy 05/01/2009   Dr. Penelope Coop pill-induced esophageal ulcerations distal to midesophagus, 2 small ulcers in the antrum of the stomach  . Shortness of breath dyspnea    with any exertion or if heart rate  is irregular while on dialysis  . Sleep apnea    reports that she no longer needs CPAP due to weight loss   Past Surgical History:  Procedure Laterality Date  . Marland KitchenHemodialysis catheter Right 11/28/2015  . A/V FISTULAGRAM N/A 07/26/2017   Procedure: A/V Fistulagram - Left Arm;  Surgeon: Serafina Mitchell, MD;  Location: [place] [facility];  Service: Cardiovascular;  Laterality: N/A;  . ABDOMINAL HYSTERECTOMY    . ADENOIDECTOMY    . AV FISTULA PLACEMENT Left 03/15/2016   Procedure: CREATION  OF LEFT ARM ARTERIOVENOUS (AV) FISTULA  ;  Surgeon: Rosetta Posner, MD;  Location: [place];  Service: Vascular;  Laterality: Left;  . CAPD INSERTION N/A 08/30/2017   Procedure: LAPAROSCOPIC INSERTION CONTINUOUS AMBULATORY PERITONEAL DIALYSIS  (CAPD) CATHETER;  Surgeon: Clovis Riley, MD;  Location: [place];  Service: General;  Laterality: N/A;  . CESAREAN SECTION      x 2  . CRANIECTOMY SUBOCCIPITAL W/ CERVICAL LAMINECTOMY / CHIARI    . FISTULA SUPERFICIALIZATION Left 05/10/2016   Procedure: Left Arm FISTULA SUPERFICIALIZATION;  Surgeon: Rosetta Posner, MD;  [facility];  Service: Vascular;  Laterality: Left;  . IR GENERIC HISTORICAL  08/14/2016   IR REMOVAL TUN ACCESS W/ PORT W/O FL MOD SED 08/14/2016 Arne Cleveland, MD [facility]-INTERV RAD  . PERIPHERAL VASCULAR CATHETERIZATION  Left 11/25/2016   Procedure: A/V Fistulagram;  Surgeon: Conrad [place], MD;  Location: [place] [facility];  Service: Cardiovascular;  Laterality: Left;  arm  . PORTACATH PLACEMENT  07/05/2012   Procedure: INSERTION PORT-A-CATH;  Surgeon: Donato Heinz, MD;  Location: [facility];  Service: General;  Laterality: Left;  subclavian  . portacath removal    . RIGHT HEART CATH N/A 08/04/2017   Procedure: RIGHT HEART CATH;  Surgeon: Jolaine Artist, MD;  Location: [place] [facility];  Service: Cardiovascular;  Laterality: N/A;  . THYROIDECTOMY, PARTIAL    . TONSILLECTOMY     Social History:  reports that she has never smoked. She has never used smokeless tobacco. She reports that she does not drink alcohol or use drugs.  Allergies  Allergen Reactions  . Contrast Media [Iodinated Diagnostic Agents] Anaphylaxis, Hives, Swelling and Other (See Comments)    Dye for cardiac cath. Tongue swells  . Pneumococcal Vaccines Swelling and Other (See Comments)    Turns skin black, and bodily swelling  . Vancomycin Nausea And Vomiting and Other (See Comments)    Infusion "made me feel like I was dying" had to be readmitted to hospital    Family History  Problem Relation Age of Onset  . Colon cancer Mother 7  . Stroke Mother 52  . Coronary artery disease Mother     Prior to Admission medications   Medication Sig Start Date End Date Taking? Authorizing Provider  acetaminophen (TYLENOL) 325 MG tablet Take 650 mg by mouth every 6 (six) hours as needed for moderate pain or fever.    Yes [provider]  albuterol (PROVENTIL) (2.5 MG/3ML) 0.083% nebulizer solution Take 2.5 mg by nebulization every 6 (six) hours as needed for wheezing or shortness of breath.   Yes [provider]  ALPRAZolam Duanne Moron) 0.5 MG tablet Take 0.5 mg by mouth 3 (three) times daily as needed for anxiety or sleep.    Yes [provider]  amLODipine (NORVASC) 10 MG tablet Take 0.5 tablets (5 mg total) by mouth  daily. 06/11/16  Yes [name], MD  diphenhydrAMINE (BENADRYL) 25 mg capsule Take 25 mg by mouth every 6 (six) hours as needed for allergies.   Yes [provider]  insulin detemir (LEVEMIR) 100 UNIT/ML injection Inject 10 Units into the skin daily.   Yes [provider]  lanthanum (FOSRENOL) 500 MG chewable tablet Chew 1,000-1,500 mg by mouth 5 (five) times daily. Patient takes 3 tablets by mouth three times a day with meals and 2 tablets by mouth twice a day with snacks   Yes [provider]  multivitamin (RENA-VIT) TABS tablet Take 1 tablet by  mouth daily. 05/30/18  Yes [provider]  Oxycodone HCl 10 MG TABS Take 1 tablet (10 mg total) by mouth 4 (four) times daily as needed (pain). 02/01/17  Yes [name], MD  potassium chloride 20 MEQ TBCR Take 40 mEq by mouth daily. 06/30/18  Yes Oswald Hillock, MD  blood glucose meter kit and supplies KIT Dispense based on patient and insurance preference. Use up to four times daily as directed. (FOR ICD-9 250.00, 250.01). 06/30/18   Oswald Hillock, MD  insulin lispro (HUMALOG) 100 UNIT/ML injection Inject 60 Units into the skin 2 (two) times daily.    03/08/12  [provider]   Physical Exam: Vitals:   12/21/18 1300 12/21/18 1400 12/21/18 1430 12/21/18 1500  BP: (!) 106/59 126/67 118/61 117/72  Pulse: 66 72 72 69  Resp: 16 (!) 21 (!) 21 17  Temp:      TempSrc:      SpO2: 97% 93% 95% 97%  Weight:      Height:         General exam: Morbidly obese female lying supine on the gurney in no obvious distress.  Head, eyes and ENT: Nontraumatic and normocephalic. Pupils equally reacting to light and accommodation. Oral mucosa moist.  Neck: Supple. No JVD, carotid bruit or thyromegaly.  Lymphatics: No lymphadenopathy.  Respiratory system: Clear to auscultation. No increased work of breathing.  Cardiovascular system: normal S1 and S2 heard.    Gastrointestinal system: Abdomen is obese, PD cath  appears clean, no signs of infection seen, no redness or irritation seen,  Very tender to ligh palpation in LLQ, no superficial signs of herpes seen. Normal bowel sounds heard. Guarding noted.   Central nervous system: Alert and oriented. No focal neurological deficits.  Extremities: severe lymphedema BLEs, tenderness bilateral thighs.   Skin: severe lymphedema changes.    Musculoskeletal system: Negative exam.  Psychiatry: Pleasant and cooperative.  Labs on Admission:  Basic Metabolic Panel: Recent Labs  Lab 12/21/18 1106 12/21/18 1134  NA 132*  --   K 2.3*  --   CL 91*  --   CO2 26  --   GLUCOSE 200*  --   BUN 42*  --   CREATININE 9.72*  --   CALCIUM 8.4*  --   MG  --  1.7   Liver Function Tests: Recent Labs  Lab 12/21/18 1106  AST 12*  ALT 21  ALKPHOS 113  BILITOT 0.4  PROT 7.6  ALBUMIN 3.2*   Recent Labs  Lab 12/21/18 1106  LIPASE 66*   No results for input(s): AMMONIA in the last 168 hours. CBC: Recent Labs  Lab 12/21/18 1106  WBC 10.8*  NEUTROABS 9.3*  HGB 10.2*  HCT 33.5*  MCV 102.8*  PLT 219   Cardiac Enzymes: No results for input(s): CKTOTAL, CKMB, CKMBINDEX, TROPONINI in the last 168 hours.  BNP (last 3 results) No results for input(s): PROBNP in the last 8760 hours. CBG: Recent Labs  Lab 12/21/18 1530  GLUCAP 152*    Radiological Exams on Admission: Dg Chest Portable 1 View  Result Date: 12/21/2018 CLINICAL DATA:  Shortness of Breath EXAM: PORTABLE CHEST 1 VIEW COMPARISON:  06/27/2018 FINDINGS: Cardiomegaly with vascular congestion. Perihilar opacities could reflect early interstitial edema. No effusions or acute bony abnormality. IMPRESSION: Cardiomegaly with vascular congestion and possible early perihilar edema. Electronically Signed   By: Rolm Baptise M.D.   On: 12/21/2018 10:49    EKG: Independently reviewed.  No acute findings.  Assessment/Plan Principal Problem:   Abdominal pain, LLQ Active Problems:   Lymphedema of  lower extremity   Diabetes mellitus (HCC)   Morbid obesity (HCC)   HTN (hypertension)   Chronic diastolic CHF (congestive heart failure) (HCC)   ESRD on dialysis (HCC)   ESRD (end stage renal disease) (HCC)   Cor pulmonale, chronic (HCC)   Hypokalemia   Peritonitis (HCC)   Influenza B   1. Bilateral Leg pain - Given her immobility I worry about DVT, would check venous dopplers of BLEs to evaluate for venous thromboembolism.   2. Type 2 Diabetes Mellitus - check CBG and supplemental sliding scale coverage.  3. Abdominal pain - I discussed with nephrology concern about possible peritonitis.  Dr Carolin Sicks will assess patient, hold antibiotics until we obtain fluid and culture, cell counts from PD catheter site.  4. ESRD on PD - Consulted nephrology team for inpatient PD.  5. Influenza B - Tamiflu started, pharmacy to assist with dosing, droplet precautions.  6. Hypokalemia - continue supplemental potassium follow renal function panel and magnesium.  7. Essential hypertension -resume home medications.   8. Chronic diastolic CHF - stable. Follow.   DVT Prophylaxis: lovenox Code Status: Full   Family Communication: patient   Disposition Plan: Home when medically stabilized   Time spent: 7 minutes  Irwin Brakeman, MD [facility] Pager [phone]  If 7PM-7AM, please contact night-coverage [email or web address] Password [facility] 12/21/2018, 4:25 PM

## 2018-12-21 NOTE — ED Provider Notes (Signed)
Complains of bilateral lower abdominal pain and bilateral leg pain.  Reports shortness of breath, off and on for a few days, no shortness of breath presently.  She also reports that she is had several episodes of vomiting.  Her last bowel movement was 12/12/2018.  On exam patient is alert nontoxic abdomen morbidly obese.  Peritoneal dialysis catheter in place.  Abdomen is tender at lower quadrants to percussion.  Bilateral lower extremities with chronic appearing brawny thickened skin skin changes.  Legs are not red or hot to the touch or tender.Kermit Balo capillary refill.   Orlie Dakin, MD 12/21/18 1046

## 2018-12-21 NOTE — Consult Note (Signed)
Reason for Consult: To manage dialysis and dialysis related needs Referring Physician: Dr. Lucianne Muss [name] is an 53 y.o. female.  HPI: 33F with ESRD on PD at [facility] in [facility], morbid obesity, chronic LE lymphedema, CAD, DM, HTN who is seen in consultation for evaluation and management of ESRD and related issues while hospitalized for influenza.    She presented to [facility] earlier today with leg pain that she was concerned was cellulitis which she's had before.  She was noted to be influenza B+.  She also had abdominal pain + constipation and there was concern for peritonitis.  She was transferred to [facility] for PD.    She denies any cloudy fluid or breaches in PD technique.  She has been doing PD for 6-7 months.  She also has a functioning forearm AVF.  Past Medical History:  Diagnosis Date  . Anxiety   . Asthma    as a child  . Cancer ([place])    thyroid  . Cellulitis   . CHF (congestive heart failure) ([place])   . Chiari malformation    s/p surgery  . Chiari malformation   . Chronic pain   . Complication of anesthesia 11/28/15   Resp arrest after  conscious  sedation  . Constipation   . Coronary artery disease    40-50% mid LAD 04/29/09, Medical tx. (Dr. Gwenlyn Found)  . Diabetes mellitus    Type II- reports being off all medication d/t it being controlled  . Fibromyalgia   . History of blood transfusion    hemorrage duinrg pregancy  . Hx of echocardiogram 10/2011   EF 55-60%  . Hypercholesterolemia   . Hypertension   . Lymph edema   . Obesity hypoventilation syndrome ([place])   . On home oxygen therapy    "2L; 24/7" (12/21/2018)  . Pneumonia    in past  . Pulmonary hypertension ([place])   . Renal disorder    M/W/F [facility] Pt started dialysis in Dec.2016  . S/P colonoscopy 05/26/2007   Dr. Laural Golden sigmoid diverticulosis random biopsies benign  . S/P endoscopy 05/01/2009   Dr. Penelope Coop pill-induced esophageal ulcerations distal to midesophagus, 2 small ulcers in  the antrum of the stomach  . Shortness of breath dyspnea    with any exertion or if heart rate  is irregular while on dialysis  . Sleep apnea    reports that she no longer needs CPAP due to weight loss    Past Surgical History:  Procedure Laterality Date  . Marland KitchenHemodialysis catheter Right 11/28/2015  . A/V FISTULAGRAM N/A 07/26/2017   Procedure: A/V Fistulagram - Left Arm;  Surgeon: Serafina [place], MD;  Location: [place] [facility];  Service: Cardiovascular;  Laterality: N/A;  . ABDOMINAL HYSTERECTOMY    . ADENOIDECTOMY    . AV FISTULA PLACEMENT Left 03/15/2016   Procedure: CREATION OF LEFT ARM ARTERIOVENOUS (AV) FISTULA  ;  Surgeon: Rosetta Posner, MD;  Location: [place];  Service: Vascular;  Laterality: Left;  . CAPD INSERTION N/A 08/30/2017   Procedure: LAPAROSCOPIC INSERTION CONTINUOUS AMBULATORY PERITONEAL DIALYSIS  (CAPD) CATHETER;  Surgeon: Clovis Riley, MD;  Location: [place];  Service: General;  Laterality: N/A;  . CESAREAN SECTION      x 2  . CRANIECTOMY SUBOCCIPITAL W/ CERVICAL LAMINECTOMY / CHIARI    . FISTULA SUPERFICIALIZATION Left 05/10/2016   Procedure: Left Arm FISTULA SUPERFICIALIZATION;  Surgeon: Rosetta Posner, MD;  Location: [place];  Service: Vascular;  Laterality:  Left;  . IR GENERIC HISTORICAL  08/14/2016   IR REMOVAL TUN ACCESS W/ PORT W/O FL MOD SED 08/14/2016 Arne Cleveland, MD [facility]-INTERV RAD  . PERIPHERAL VASCULAR CATHETERIZATION Left 11/25/2016   Procedure: A/V Fistulagram;  Surgeon: Conrad Tijeras, MD;  Location: [place] [facility];  Service: Cardiovascular;  Laterality: Left;  arm  . PORTACATH PLACEMENT  07/05/2012   Procedure: INSERTION PORT-A-CATH;  Surgeon: Donato Heinz, MD;  Location: [facility];  Service: General;  Laterality: Left;  subclavian  . portacath removal    . RIGHT HEART CATH N/A 08/04/2017   Procedure: RIGHT HEART CATH;  Surgeon: Jolaine Artist, MD;  Location: [place] [facility];  Service: Cardiovascular;  Laterality: N/A;  . THYROIDECTOMY, PARTIAL     . TONSILLECTOMY      Family History  Problem Relation Age of Onset  . Colon cancer Mother 93  . Stroke Mother 56  . Coronary artery disease Mother     Social History:  reports that she has never smoked. She has never used smokeless tobacco. She reports that she does not drink alcohol or use drugs.  Allergies:  Allergies  Allergen Reactions  . Contrast Media [Iodinated Diagnostic Agents] Anaphylaxis, Hives, Swelling and Other (See Comments)    Dye for cardiac cath. Tongue swells  . Pneumococcal Vaccines Swelling and Other (See Comments)    Turns skin black, and bodily swelling  . Vancomycin Nausea And Vomiting and Other (See Comments)    Infusion "made me feel like I was dying" had to be readmitted to hospital    Medications: I have reviewed the patient's current medications.  Results for orders placed or performed during the hospital encounter of 12/21/18 (from the past 48 hour(s))  Influenza panel by PCR (type A & B)     Status: Abnormal   Collection Time: 12/21/18 10:14 AM  Result Value Ref Range   Influenza A By PCR NEGATIVE NEGATIVE   Influenza B By PCR POSITIVE (A) NEGATIVE    Comment: (NOTE) The Xpert Xpress Flu assay is intended as an aid in the diagnosis of  influenza and should not be used as a sole basis for treatment.  This  assay is FDA approved for nasopharyngeal swab specimens only. Nasal  washings and aspirates are unacceptable for Xpert Xpress Flu testing. Performed at [facility], [address] [place], [place] [address]   Culture, blood (routine x 2)     Status: None (Preliminary result)   Collection Time: 12/21/18 11:06 AM  Result Value Ref Range   Specimen Description BLOOD BLOOD RIGHT WRIST    Special Requests      BOTTLES DRAWN AEROBIC ONLY Blood Culture results may not be optimal due to an inadequate volume of blood received in culture bottles Performed at [facility], [address], [place],  [address]    Culture PENDING    Report  Status PENDING   CBC with Differential     Status: Abnormal   Collection Time: 12/21/18 11:06 AM  Result Value Ref Range   WBC 10.8 (H) 4.0 - 10.5 K/uL   RBC 3.26 (L) 3.87 - 5.11 MIL/uL   Hemoglobin 10.2 (L) 12.0 - 15.0 g/dL   HCT 33.5 (L) 36.0 - 46.0 %   MCV 102.8 (H) 80.0 - 100.0 fL   MCH 31.3 26.0 - 34.0 pg   MCHC 30.4 30.0 - 36.0 g/dL   RDW 14.2 11.5 - 15.5 %   Platelets 219 150 - 400 K/uL  nRBC 0.0 0.0 - 0.2 %   Neutrophils Relative % 86 %   Neutro Abs 9.3 (H) 1.7 - 7.7 K/uL   Lymphocytes Relative 7 %   Lymphs Abs 0.8 0.7 - 4.0 K/uL   Monocytes Relative 5 %   Monocytes Absolute 0.5 0.1 - 1.0 K/uL   Eosinophils Relative 1 %   Eosinophils Absolute 0.1 0.0 - 0.5 K/uL   Basophils Relative 0 %   Basophils Absolute 0.0 0.0 - 0.1 K/uL   Immature Granulocytes 1 %   Abs Immature Granulocytes 0.07 0.00 - 0.07 K/uL    Comment: Performed at [facility], [address] [place], [place] [address]  Comprehensive metabolic panel     Status: Abnormal   Collection Time: 12/21/18 11:06 AM  Result Value Ref Range   Sodium 132 (L) 135 - 145 mmol/L   Potassium 2.3 (LL) 3.5 - 5.1 mmol/L    Comment: CRITICAL RESULT CALLED TO, READ BACK BY AND VERIFIED WITH: PATRAW,B AT 11:55AM ON 12/21/18 BY FESTERMAN,C    Chloride 91 (L) 98 - 111 mmol/L   CO2 26 22 - 32 mmol/L   Glucose, Bld 200 (H) 70 - 99 mg/dL   BUN 42 (H) 6 - 20 mg/dL   Creatinine, Ser 9.72 (H) 0.44 - 1.00 mg/dL   Calcium 8.4 (L) 8.9 - 10.3 mg/dL   Total Protein 7.6 6.5 - 8.1 g/dL   Albumin 3.2 (L) 3.5 - 5.0 g/dL   AST 12 (L) 15 - 41 U/L   ALT 21 0 - 44 U/L   Alkaline Phosphatase 113 38 - 126 U/L   Total Bilirubin 0.4 0.3 - 1.2 mg/dL   GFR calc non Af Amer 4 (L) >60 mL/min   GFR calc Af Amer 5 (L) >60 mL/min   Anion gap 15 5 - 15    Comment: Performed at [facility], [address] [place], [place] [address]  Lipase, blood     Status: Abnormal   Collection Time: 12/21/18 11:06 AM  Result Value Ref Range   Lipase 66 (H) 11 -  51 U/L    Comment: Performed at [facility], [address] [place], [place] [address]  Magnesium     Status: None   Collection Time: 12/21/18 11:34 AM  Result Value Ref Range   Magnesium 1.7 1.7 - 2.4 mg/dL    Comment: Performed at [facility], [address] [place], [place] [address]  CBG monitoring, ED     Status: Abnormal   Collection Time: 12/21/18  3:30 PM  Result Value Ref Range   Glucose-Capillary 152 (H) 70 - 99 mg/dL  Glucose, capillary     Status: Abnormal   Collection Time: 12/21/18  4:31 PM  Result Value Ref Range   Glucose-Capillary 150 (H) 70 - 99 mg/dL  CBC     Status: Abnormal   Collection Time: 12/21/18  4:52 PM  Result Value Ref Range   WBC 10.4 4.0 - 10.5 K/uL   RBC 2.99 (L) 3.87 - 5.11 MIL/uL   Hemoglobin 9.4 (L) 12.0 - 15.0 g/dL   HCT 30.1 (L) 36.0 - 46.0 %   MCV 100.7 (H) 80.0 - 100.0 fL   MCH 31.4 26.0 - 34.0 pg   MCHC 31.2 30.0 - 36.0 g/dL   RDW 14.0 11.5 - 15.5 %   Platelets 203 150 - 400 K/uL   nRBC 0.0 0.0 - 0.2 %    Comment: Performed at [facility], [place],  [place] [address]  Creatinine, serum     Status: Abnormal   Collection Time: 12/21/18  4:52 PM  Result Value Ref Range   Creatinine, Ser 10.48 (H) 0.44 - 1.00 mg/dL   GFR calc non Af Amer 4 (L) >60 mL/min   GFR calc Af Amer 4 (L) >60 mL/min    Comment: Performed at [facility] [address], [place], [place] [address]  Albumin, pleural or peritoneal fluid     Status: None   Collection Time: 12/21/18  5:00 PM  Result Value Ref Range   Albumin, Fluid <1.0 g/dL   Fluid Type-FALB PERITONEAL     Comment: Performed at [facility], [place] [address] [place], [place] [address]  Glucose, pleural or peritoneal fluid     Status: None   Collection Time: 12/21/18  5:00 PM  Result Value Ref Range   Glucose, Fluid 605 mg/dL    Comment: (NOTE) No normal range established for this test Results should be evaluated in conjunction with serum values    Fluid  Type-FGLU PERITONEAL     Comment: Performed at [facility] [address] [place], [place] [address]  Lactate dehydrogenase (pleural or peritoneal fluid)     Status: None   Collection Time: 12/21/18  5:00 PM  Result Value Ref Range   LD, Fluid <5 3 - 23 U/L   Fluid Type-FLDH PERITONEAL     Comment: Performed at [facility], [place] [address], [place], [place] [address]  Protein, pleural or peritoneal fluid     Status: None   Collection Time: 12/21/18  5:00 PM  Result Value Ref Range   Total protein, fluid <3.0 g/dL   Fluid Type-FTP PERITONEAL     Comment: Performed at [facility], [place] [address], [place], [place] [address]  Body fluid cell count with differential     Status: Abnormal   Collection Time: 12/21/18  7:00 PM  Result Value Ref Range   Fluid Type-FCT Peritoneal    Color, Fluid COLORLESS (A) YELLOW   Appearance, Fluid CLEAR CLEAR   WBC, Fluid 4 0 - 1,000 cu mm   Other Cells, Fluid TOO FEW TO COUNT, SMEAR AVAILABLE FOR REVIEW %    Comment: FEW MONOS Performed at [place] [address], [place], [place] [address]     Dg Chest Portable 1 View  Result Date: 12/21/2018 CLINICAL DATA:  Shortness of Breath EXAM: PORTABLE CHEST 1 VIEW COMPARISON:  06/27/2018 FINDINGS: Cardiomegaly with vascular congestion. Perihilar opacities could reflect early interstitial edema. No effusions or acute bony abnormality. IMPRESSION: Cardiomegaly with vascular congestion and possible early perihilar edema. Electronically Signed   By: Rolm Baptise M.D.   On: 12/21/2018 10:49    ROS: 12 system ROS neg except per HPI  Blood pressure 117/72, pulse 69, temperature 98.1 F (36.7 C), temperature source Rectal, resp. rate 17, height _0  (1.651 m), weight (!) 162.8 kg, SpO2 97 %. Gen: fatigued, morbidly obese, lying comfortably in bed Eyes: anicteric ENT: MMM Neck: supple CV: RRR, no rub Lungs: normal WOB, clear Abd: soft, mild generalized TTP, dec BS, PD catheter  dressing c/d/i Extr: chronic woody skin changes and massive enlargement consistent with lymphedema, L worse than R; L forearm AVF thrill and bruit, somewhat tortous but would be useable Neuro: nonfocal  Assessment/Plan: 1.  ESRD on PD:  Suspected peritonitis based on symptoms so I initially ordered empiric antibiotics per ISPD guidelines (cefazolin given  vanc allergy + ceftaz), however her PD fluid cell count has already resulted and she does not have peritonitis thus I've discontinued the antibiotics.   She states her CCPD Rx is 6 cycles over 9 hours, fill vol 2.5L, last fill of 2.5L icodextran.  She uses 2.5% dialysate.  Icodextran is not available so will do a last fill of 1L for comfort.    She has a back up AVF which she wonders if she should switch back to HD.  I see no pressing reason and I told her we could continue that conversation while she's hospitalized.    2.  Constipation:  No BM since 12/24.  Miralax TID x 6 doses + daily PRN ordered  3.  Anemia: Hb 9.4, will obtain outpt orders re: ESA and iron and continue while admitted PRN  4.  BMM:  Continue outpt meds.  Will obtain records to review recent labs.    5.  Influenza B: tamiflu per primary.   6.  DM: per primary  7.  Hypokalemia: being supplemented, will follow. Switched to heart health, carb modified diet so K isn't restricted.  Justin Mend 12/21/2018, 8:16 PM

## 2018-12-22 ENCOUNTER — Inpatient Hospital Stay (HOSPITAL_BASED_OUTPATIENT_CLINIC_OR_DEPARTMENT_OTHER): Payer: Medicare Other

## 2018-12-22 DIAGNOSIS — I82403 Acute embolism and thrombosis of unspecified deep veins of lower extremity, bilateral: Secondary | ICD-10-CM

## 2018-12-22 LAB — CBC WITH DIFFERENTIAL/PLATELET
Abs Immature Granulocytes: 0.19 10*3/uL — ABNORMAL HIGH (ref 0.00–0.07)
Basophils Absolute: 0.1 10*3/uL (ref 0.0–0.1)
Basophils Relative: 1 %
Eosinophils Absolute: 0.4 10*3/uL (ref 0.0–0.5)
Eosinophils Relative: 4 %
HEMATOCRIT: 29.1 % — AB (ref 36.0–46.0)
Hemoglobin: 9.7 g/dL — ABNORMAL LOW (ref 12.0–15.0)
Immature Granulocytes: 2 %
LYMPHS ABS: 1.4 10*3/uL (ref 0.7–4.0)
LYMPHS PCT: 16 %
MCH: 33 pg (ref 26.0–34.0)
MCHC: 33.3 g/dL (ref 30.0–36.0)
MCV: 99 fL (ref 80.0–100.0)
Monocytes Absolute: 0.5 10*3/uL (ref 0.1–1.0)
Monocytes Relative: 6 %
Neutro Abs: 6.4 10*3/uL (ref 1.7–7.7)
Neutrophils Relative %: 71 %
Platelets: UNDETERMINED 10*3/uL (ref 150–400)
RBC: 2.94 MIL/uL — ABNORMAL LOW (ref 3.87–5.11)
RDW: 13.7 % (ref 11.5–15.5)
WBC: 9 10*3/uL (ref 4.0–10.5)
nRBC: 0.2 % (ref 0.0–0.2)

## 2018-12-22 LAB — RENAL FUNCTION PANEL
ANION GAP: 16 — AB (ref 5–15)
Albumin: 2.7 g/dL — ABNORMAL LOW (ref 3.5–5.0)
BUN: 42 mg/dL — AB (ref 6–20)
CO2: 22 mmol/L (ref 22–32)
Calcium: 8.9 mg/dL (ref 8.9–10.3)
Chloride: 97 mmol/L — ABNORMAL LOW (ref 98–111)
Creatinine, Ser: 10.28 mg/dL — ABNORMAL HIGH (ref 0.44–1.00)
GFR calc Af Amer: 4 mL/min — ABNORMAL LOW (ref >60)
GFR calc non Af Amer: 4 mL/min — ABNORMAL LOW (ref >60)
Glucose, Bld: 142 mg/dL — ABNORMAL HIGH (ref 70–99)
Phosphorus: 6.4 mg/dL — ABNORMAL HIGH (ref 2.5–4.6)
Potassium: 4.1 mmol/L (ref 3.5–5.1)
Sodium: 135 mmol/L (ref 135–145)

## 2018-12-22 LAB — MAGNESIUM: Magnesium: 1.8 mg/dL (ref 1.7–2.4)

## 2018-12-22 LAB — GLUCOSE, CAPILLARY
Glucose-Capillary: 138 mg/dL — ABNORMAL HIGH (ref 70–99)
Glucose-Capillary: 92 mg/dL (ref 70–99)
Glucose-Capillary: 99 mg/dL (ref 70–99)
Glucose-Capillary: 152 mg/dL — ABNORMAL HIGH (ref 70–99)

## 2018-12-22 LAB — HIV ANTIBODY (ROUTINE TESTING W REFLEX): HIV Screen 4th Generation wRfx: NONREACTIVE

## 2018-12-22 MED ORDER — DARBEPOETIN ALFA 60 MCG/0.3ML IJ SOSY
60.0000 ug | PREFILLED_SYRINGE | Freq: Once | INTRAMUSCULAR | Status: AC
  Administered 2018-12-22: 60 ug via SUBCUTANEOUS
  Filled 2018-12-22: qty 0.3

## 2018-12-22 MED ORDER — SENNOSIDES-DOCUSATE SODIUM 8.6-50 MG PO TABS
1.0000 | ORAL_TABLET | Freq: Two times a day (BID) | ORAL | Status: DC
  Administered 2018-12-22 – 2019-01-04 (×27): 1 via ORAL
  Filled 2018-12-22 (×27): qty 1

## 2018-12-22 NOTE — Progress Notes (Signed)
PROGRESS NOTE    [name]  OEU:[id number] DOB: [birth year] DOA: 12/21/2018 PCP: [name], MD     Brief Narrative:  [name] is a 53 y.o. female with ESRD on PD presented to ED by EMS with complaints of 10/10 pain in bilateral legs and abdomen.  She feels like she might have a recurrence of cellulitis in her legs which she has had multiple times in the past. She reports that she had dialysis at home last night.  She reports that her legs have increased in size recently.  She has chronic lymphedema in both legs which is severe.  She reports abdominal pain in LLQ below her PD cath site.  She reports intermittent fever at home but no chills.  She denies nausea and vomiting.  She has been concerned about blood clots in legs as she is very immobile.  She tested positive for flu B and was placed on Tamiflu.  There was concern for peritonitis, patient underwent paracentesis which was negative for peritonitis.  Nephrology was consulted.  New events last 24 hours / Subjective: Continues to have bilateral upper thigh pain as well as lower abdominal pain.  Assessment & Plan:   Principal Problem:   Influenza B Active Problems:   Lymphedema of lower extremity   Diabetes mellitus (HCC)   Morbid obesity (HCC)   HTN (hypertension)   Chronic diastolic CHF (congestive heart failure) (HCC)   ESRD on dialysis (HCC)   ESRD (end stage renal disease) (HCC)   Cor pulmonale, chronic (HCC)   Hypokalemia   Peritonitis (HCC)   Abdominal pain, LLQ   Influenza B Tamiflu received 52m dose, which is full 5 day course for pt on PD. Discussed with pharmacist. Droplet precaution  Abdominal pain Paracentesis: negative for peritonitis.  Antibiotics have been stopped ?Could be secondary to constipation, no bowel movement for 1 week.  Ordered bowel regimen  Bilateral leg pain DVT ultrasound was negative ?Could be secondary to MSK due to flu   ESRD on peritoneal dialysis Nephrology  consulted  Type 2 diabetes Levemir, sliding-scale insulin  Essential hypertension Continue Norvasc  Morbid obesity Body mass index is 59.73 kg/m.   Anxiety Continue Xanax   DVT prophylaxis: Subq hep Code Status: Full Family Communication:  No family at bedside Disposition Plan: Pending improvement in abdominal pain, leg pain    Consultants:   Nephrology  Procedures:   PD   Antimicrobials:  Anti-infectives (From admission, onward)   Start     Dose/Rate Route Frequency Ordered Stop   12/22/18 1000  oseltamivir (TAMIFLU) capsule 30 mg  Status:  Discontinued     30 mg Oral Daily 12/21/18 1631 12/21/18 1738   12/21/18 1700  cefTAZidime (FORTAZ) 2 g in sodium chloride 0.9 % 100 mL IVPB  Status:  Discontinued     2 g 200 mL/hr over 30 Minutes Intravenous  Once 12/21/18 1655 12/21/18 2016   12/21/18 1700  ceFAZolin (ANCEF) IVPB 1 g/50 mL premix  Status:  Discontinued     1 g 100 mL/hr over 30 Minutes Intravenous  Once 12/21/18 1655 12/21/18 2016   12/21/18 1215  oseltamivir (TAMIFLU) capsule 30 mg     30 mg Oral  Once 12/21/18 1209 12/21/18 1307        Objective: Vitals:   12/21/18 1900 12/21/18 2121 12/22/18 0616 12/22/18 1030  BP:  (!) 112/59 100/61 110/60  Pulse:  70 60 75  Resp:  _0 Temp:  97.9 F (36.6  C) 98.3 F (36.8 C) 98.7 F (37.1 C)  TempSrc:  Oral  Oral  SpO2:  99% 98% 100%  Weight: (!) 162.8 kg     Height:        Intake/Output Summary (Last 24 hours) at 12/22/2018 1411 Last data filed at 12/22/2018 1233 Gross per 24 hour  Intake 13926 ml  Output 14928 ml  Net -1002 ml   Filed Weights   12/21/18 0952 12/21/18 1838 12/21/18 1900  Weight: (!) 159.4 kg (!) 164.1 kg (!) 162.8 kg    Examination:  General exam: Appears calm and comfortable  Respiratory system: Clear to auscultation. Respiratory effort normal. Cardiovascular system: S1 & S2 heard, RRR. No JVD, murmurs, rubs, gallops or clicks. +pedal edema. Gastrointestinal system:  Abdomen is nondistended, soft and TTP bilateral lower quadrants. No organomegaly or masses felt. Normal bowel sounds heard. Central nervous system: Alert and oriented. No focal neurological deficits. Extremities: Symmetric 5 x 5 power. Skin: No rashes, lesions or ulcers, chronic venous stasis changes of bilateral LEs  Psychiatry: Judgement and insight appear normal. Mood & affect appropriate.   Data Reviewed: I have personally reviewed following labs and imaging studies  CBC: Recent Labs  Lab 12/21/18 1106 12/21/18 1652 12/22/18 0803  WBC 10.8* 10.4 9.0  NEUTROABS 9.3*  --  6.4  HGB 10.2* 9.4* 9.7*  HCT 33.5* 30.1* 29.1*  MCV 102.8* 100.7* 99.0  PLT 219 203 PLATELET CLUMPS NOTED ON SMEAR, UNABLE TO ESTIMATE   Basic Metabolic Panel: Recent Labs  Lab 12/21/18 1106 12/21/18 1134 12/21/18 1652 12/22/18 0803  NA 132*  --   --  135  K 2.3*  --   --  4.1  CL 91*  --   --  97*  CO2 26  --   --  22  GLUCOSE 200*  --   --  142*  BUN 42*  --   --  42*  CREATININE 9.72*  --  10.48* 10.28*  CALCIUM 8.4*  --   --  8.9  MG  --  1.7  --  1.8  PHOS  --   --   --  6.4*   GFR: Estimated Creatinine Clearance: 10 mL/min (A) (by C-G formula based on SCr of 10.28 mg/dL (H)). Liver Function Tests: Recent Labs  Lab 12/21/18 1106 12/22/18 0803  AST 12*  --   ALT 21  --   ALKPHOS 113  --   BILITOT 0.4  --   PROT 7.6  --   ALBUMIN 3.2* 2.7*   Recent Labs  Lab 12/21/18 1106  LIPASE 66*   No results for input(s): AMMONIA in the last 168 hours. Coagulation Profile: No results for input(s): INR, PROTIME in the last 168 hours. Cardiac Enzymes: No results for input(s): CKTOTAL, CKMB, CKMBINDEX, TROPONINI in the last 168 hours. BNP (last 3 results) No results for input(s): PROBNP in the last 8760 hours. HbA1C: No results for input(s): HGBA1C in the last 72 hours. CBG: Recent Labs  Lab 12/21/18 1530 12/21/18 1631 12/22/18 0739 12/22/18 1135  GLUCAP 152* 150* 138* 92   Lipid  Profile: No results for input(s): CHOL, HDL, LDLCALC, TRIG, CHOLHDL, LDLDIRECT in the last 72 hours. Thyroid Function Tests: No results for input(s): TSH, T4TOTAL, FREET4, T3FREE, THYROIDAB in the last 72 hours. Anemia Panel: No results for input(s): VITAMINB12, FOLATE, FERRITIN, TIBC, IRON, RETICCTPCT in the last 72 hours. Sepsis Labs: No results for input(s): PROCALCITON, LATICACIDVEN in the last 168 hours.  Recent Results (from the  past 240 hour(s))  Culture, blood (routine x 2)     Status: None (Preliminary result)   Collection Time: 12/21/18 11:06 AM  Result Value Ref Range Status   Specimen Description BLOOD BLOOD RIGHT WRIST  Final   Special Requests   Final    BOTTLES DRAWN AEROBIC ONLY Blood Culture results may not be optimal due to an inadequate volume of blood received in culture bottles   Culture   Final    NO GROWTH < 24 HOURS Performed at [facility], [address], [place], [place] [address]    Report Status PENDING  Incomplete  MRSA PCR Screening     Status: None   Collection Time: 12/21/18  6:14 PM  Result Value Ref Range Status   MRSA by PCR NEGATIVE NEGATIVE Final    Comment:        The GeneXpert MRSA Assay (FDA approved for NASAL specimens only), is one component of a comprehensive MRSA colonization surveillance program. It is not intended to diagnose MRSA infection nor to guide or monitor treatment for MRSA infections. Performed at [facility], [place] [address], [place], [place] [address]   Culture, body fluid-bottle     Status: None (Preliminary result)   Collection Time: 12/21/18  8:18 PM  Result Value Ref Range Status   Specimen Description FLUID PERITONEAL DIALYSATE  Final   Special Requests BOTTLES DRAWN AEROBIC AND ANAEROBIC  Final   Culture   Final    NO GROWTH < 12 HOURS Performed at [facility], [place] [address] [place], [place] [address]    Report Status PENDING  Incomplete  Gram stain     Status: None   Collection Time:  12/21/18  8:18 PM  Result Value Ref Range Status   Specimen Description FLUID PERITONEAL DIALYSATE  Final   Special Requests NONE  Final   Gram Stain   Final    WBC PRESENT,BOTH PMN AND MONONUCLEAR NO ORGANISMS SEEN CYTOSPIN SMEAR Performed at [facility], [address], [place], [place] [address]    Report Status 12/21/2018 FINAL  Final       Radiology Studies: Dg Chest Portable 1 View  Result Date: 12/21/2018 CLINICAL DATA:  Shortness of Breath EXAM: PORTABLE CHEST 1 VIEW COMPARISON:  06/27/2018 FINDINGS: Cardiomegaly with vascular congestion. Perihilar opacities could reflect early interstitial edema. No effusions or acute bony abnormality. IMPRESSION: Cardiomegaly with vascular congestion and possible early perihilar edema. Electronically Signed   By: Rolm Baptise M.D.   On: 12/21/2018 10:49   Vas [place] Lower Extremity Venous (dvt)  Result Date: 12/22/2018  Lower Venous Study Indications: Swelling, Edema, and Pain.  Limitations: Body habitus, poor ultrasound/tissue interface and pain tolerance. Performing Technologist: Abram Sander RVS  Examination Guidelines: A complete evaluation includes B-mode imaging, spectral Doppler, color Doppler, and power Doppler as needed of all accessible portions of each vessel. Bilateral testing is considered an integral part of a complete examination. Limited examinations for reoccurring indications may be performed as noted.  Right Venous Findings: +---------+---------------+---------+-----------+----------+--------------+          CompressibilityPhasicitySpontaneityPropertiesSummary        +---------+---------------+---------+-----------+----------+--------------+ CFV      Full           Yes      Yes                                 +---------+---------------+---------+-----------+----------+--------------+ SFJ  Full           Yes      Yes                                  +---------+---------------+---------+-----------+----------+--------------+ FV Prox                                               Not visualized +---------+---------------+---------+-----------+----------+--------------+ FV Mid                                                Not visualized +---------+---------------+---------+-----------+----------+--------------+ FV Distal                                             Not visualized +---------+---------------+---------+-----------+----------+--------------+ PFV                                                   Not visualized +---------+---------------+---------+-----------+----------+--------------+ POP                     Yes      Yes                                 +---------+---------------+---------+-----------+----------+--------------+ PTV                                                   Not visualized +---------+---------------+---------+-----------+----------+--------------+ PERO                                                  Not visualized +---------+---------------+---------+-----------+----------+--------------+  Left Venous Findings: +---------+---------------+---------+-----------+----------+--------------+          CompressibilityPhasicitySpontaneityPropertiesSummary        +---------+---------------+---------+-----------+----------+--------------+ CFV                                                   Not visualized +---------+---------------+---------+-----------+----------+--------------+ SFJ                                                   Not visualized +---------+---------------+---------+-----------+----------+--------------+ FV Prox  Not visualized +---------+---------------+---------+-----------+----------+--------------+ FV Mid                                                Not visualized  +---------+---------------+---------+-----------+----------+--------------+ FV Distal                                             Not visualized +---------+---------------+---------+-----------+----------+--------------+ PFV                                                   Not visualized +---------+---------------+---------+-----------+----------+--------------+ POP                     Yes      Yes                                 +---------+---------------+---------+-----------+----------+--------------+ PTV                                                   Not visualized +---------+---------------+---------+-----------+----------+--------------+ PERO                                                  Not visualized +---------+---------------+---------+-----------+----------+--------------+    Summary: Right: There is no evidence of deep vein thrombosis in the lower extremity. However, portions of this examination were limited- see technologist comments above. No cystic structure found in the popliteal fossa. Left: There is no evidence of deep vein thrombosis in the lower extremity. However, portions of this examination were limited- see technologist comments above. No cystic structure found in the popliteal fossa.  *See table(s) above for measurements and observations.    Preliminary       Scheduled Meds: . amLODipine  5 mg Oral Daily  . extraneal (ICODEXTRIN) peritoneal dialysis solution   Intraperitoneal Q24H  . gentamicin cream  1 application Topical Daily  . heparin  5,000 Units Subcutaneous Q8H  . insulin aspart  0-20 Units Subcutaneous TID WC  . insulin detemir  10 Units Subcutaneous Daily  . lanthanum  1,500 mg Oral TID WC  . multivitamin  1 tablet Oral Daily  . polyethylene glycol  17 g Oral TID  . potassium chloride SA  40 mEq Oral Daily  . sodium chloride flush  3 mL Intravenous Q12H   Continuous Infusions: . sodium chloride    . dialysis solution 2.5%  low-MG/low-CA       LOS: 1 day    Time spent: 40 minutes   Dessa Phi, DO [facility] [email or web address] Password [email or web address] 12/22/2018, 2:11 PM

## 2018-12-22 NOTE — Consult Note (Signed)
[facility] CM Inpatient Consult   12/22/2018  [name] 08/07/1966 [id number]  Patient was assessed for [facility] for community services and for high risk scores for unplanned readmissions. Patient was previously active with [facility].  Patient is currently off the unit. Of note, [facility] Management services does not replace or interfere with any services that are arranged by inpatient case management or social work. For additional questions or referrals please contact:  Natividad Brood, RN BSN [facility] Liaison  [phone] business mobile phone Toll free office [phone]

## 2018-12-22 NOTE — Progress Notes (Signed)
[facility]    NEPHROLOGY PROGRESS NOTE  SUBJECTIVE: still with some SOB.  Denies cough or fever.  No n/v.  No headaches.  Complaining of a painful skin rash in her pannus.  Otherwise no acute complaints.     OBJECTIVE:  Vitals:   12/22/18 0616 12/22/18 1030  BP: 100/61 110/60  Pulse: 60 75  Resp: 18 18  Temp: 98.3 F (36.8 C) 98.7 F (37.1 C)  SpO2: 98% 100%   I/O last 3 completed shifts: In: 57 [P.O.:180] Out: 0    Genearl:  AAOx3 NAD HEENT: MMM [place] AT anicteric sclera Neck:  No JVD, no adenopathy CV:  Heart RRR  Lungs:  L/S CTA bilaterally Abd:  abd SNT/ND with normal BS, obese, LLQ pd catheter GU:  Bladder non-palpable Extremities:  Bilateral LE lymphedema Skin:  (+)rash in pannus  MEDICATIONS:   Current Facility-Administered Medications:  .  0.9 %  sodium chloride infusion, 250 mL, Intravenous, PRN, Johnson, Clanford L, MD .  acetaminophen (TYLENOL) tablet 650 mg, 650 mg, Oral, Q6H PRN, Johnson, Clanford L, MD .  ALPRAZolam Duanne Moron) tablet 0.5 mg, 0.5 mg, Oral, TID PRN, Johnson, Clanford L, MD .  amLODipine (NORVASC) tablet 5 mg, 5 mg, Oral, Daily, Johnson, Clanford L, MD, 5 mg at 12/22/18 1133 .  dialysis solution 2.5% low-MG/low-CA dianeal solution, , Intraperitoneal, Q24H, Johnson, Clanford L, MD .  extraneal (ICODEXTRIN) peritoneal dialysis solution, , Intraperitoneal, Q24H, Justin Mend, MD .  gentamicin cream (GARAMYCIN) 0.1 % 1 application, 1 application, Topical, Daily, Justin Mend, MD, 1 application at 18/29/93 1135 .  heparin 2,500 Units in dialysis solution 2.5% low-MG/low-CA 5,000 mL dialysis solution, , Peritoneal Dialysis, PRN, Johnson, Clanford L, MD .  heparin injection 5,000 Units, 5,000 Units, Subcutaneous, Q8H, Johnson, Clanford L, MD, 5,000 Units at 12/22/18 [phone] .  insulin aspart (novoLOG) injection 0-20 Units, 0-20 Units, Subcutaneous, TID WC, Johnson, Clanford L, MD, 3 Units at 12/22/18 0848 .  insulin detemir  (LEVEMIR) injection 10 Units, 10 Units, Subcutaneous, Daily, Johnson, Clanford L, MD, 10 Units at 12/22/18 1134 .  lanthanum (FOSRENOL) chewable tablet 1,000 mg, 1,000 mg, Oral, BID PRN, Hammons, Theone Murdoch, RPH .  lanthanum (FOSRENOL) chewable tablet 1,500 mg, 1,500 mg, Oral, TID WC, Johnson, Clanford L, MD, 1,500 mg at 12/22/18 1234 .  multivitamin (RENA-VIT) tablet 1 tablet, 1 tablet, Oral, Daily, Johnson, Clanford L, MD, 1 tablet at 12/22/18 1133 .  oxyCODONE (Oxy IR/ROXICODONE) immediate release tablet 10 mg, 10 mg, Oral, QID PRN, Wynetta Emery, Clanford L, MD, 10 mg at 12/22/18 0853 .  polyethylene glycol (MIRALAX / GLYCOLAX) packet 17 g, 17 g, Oral, TID, Justin Mend, MD, 17 g at 12/22/18 1132 .  polyethylene glycol (MIRALAX / GLYCOLAX) packet 17 g, 17 g, Oral, Daily PRN, Justin Mend, MD .  potassium chloride SA (K-DUR,KLOR-CON) CR tablet 40 mEq, 40 mEq, Oral, Daily, Johnson, Clanford L, MD, 40 mEq at 12/22/18 1133 .  promethazine (PHENERGAN) tablet 12.5 mg, 12.5 mg, Oral, Q6H PRN, Johnson, Clanford L, MD, 12.5 mg at 12/22/18 0640 .  senna-docusate (Senokot-S) tablet 1 tablet, 1 tablet, Oral, BID, Dessa Phi, DO .  sodium chloride flush (NS) 0.9 % injection 3 mL, 3 mL, Intravenous, Q12H, Johnson, Clanford L, MD, 3 mL at 12/22/18 1233 .  sodium chloride flush (NS) 0.9 % injection 3 mL, 3 mL, Intravenous, PRN, Murlean Iba, MD     LABS:  CBC Latest Ref Rng & Units 12/22/2018 12/21/2018 12/21/2018  WBC 4.0 - 10.5 K/uL 9.0 10.4 10.8(H)  Hemoglobin 12.0 - 15.0 g/dL 9.7(L) 9.4(L) 10.2(L)  Hematocrit 36.0 - 46.0 % 29.1(L) 30.1(L) 33.5(L)  Platelets 150 - 400 K/uL PLATELET CLUMPS NOTED ON SMEAR, UNABLE TO ESTIMATE 203 219    CMP Latest Ref Rng & Units 12/22/2018 12/21/2018 12/21/2018  Glucose 70 - 99 mg/dL 142(H) - 200(H)  BUN 6 - 20 mg/dL 42(H) - 42(H)  Creatinine 0.44 - 1.00 mg/dL 10.28(H) 10.48(H) 9.72(H)  Sodium 135 - 145 mmol/L 135 - 132(L)  Potassium 3.5 - 5.1 mmol/L 4.1 -  2.3(LL)  Chloride 98 - 111 mmol/L 97(L) - 91(L)  CO2 22 - 32 mmol/L 22 - 26  Calcium 8.9 - 10.3 mg/dL 8.9 - 8.4(L)  Total Protein 6.5 - 8.1 g/dL - - 7.6  Total Bilirubin 0.3 - 1.2 mg/dL - - 0.4  Alkaline Phos 38 - 126 U/L - - 113  AST 15 - 41 U/L - - 12(L)  ALT 0 - 44 U/L - - 21    Lab Results  Component Value Date   PTH 127 (H) 11/29/2015   CALCIUM 8.9 12/22/2018   CAION 1.06 (L) 07/26/2017   PHOS 6.4 (H) 12/22/2018       Component Value Date/Time   COLORURINE YELLOW 08/11/2016 1800   APPEARANCEUR HAZY (A) 08/11/2016 1800   LABSPEC 1.020 08/11/2016 1800   PHURINE 5.5 08/11/2016 1800   GLUCOSEU NEGATIVE 08/11/2016 1800   HGBUR MODERATE (A) 08/11/2016 1800   BILIRUBINUR NEGATIVE 08/11/2016 1800   KETONESUR NEGATIVE 08/11/2016 1800   PROTEINUR 100 (A) 08/11/2016 1800   UROBILINOGEN 0.2 12/02/2014 1925   NITRITE NEGATIVE 08/11/2016 1800   LEUKOCYTESUR NEGATIVE 08/11/2016 1800      Component Value Date/Time   PHART 7.290 (L) 01/24/2017 1140   PCO2ART 41.0 01/24/2017 1140   PO2ART 85 01/24/2017 1140   HCO3 29.2 (H) 08/04/2017 0751   TCO2 31 08/04/2017 0751   ACIDBASEDEF 6.3 (H) 01/24/2017 1140   O2SAT 71.0 08/04/2017 0751       Component Value Date/Time   IRON 40 11/29/2015 0410   TIBC 277 11/29/2015 0410   FERRITIN 2214 (H) 11/13/2011 0500   IRONPCTSAT 14 11/29/2015 0410       ASSESSMENT/PLAN:     Problem List Items Addressed This Visit      Respiratory   * (Principal) Influenza B - Primary   Relevant Medications   oseltamivir (TAMIFLU) capsule 30 mg (Completed)   gentamicin cream (GARAMYCIN) 0.1 % 1 application     Other   Hypokalemia   Peritonitis (HCC)    Other Visit Diagnoses    Bilateral leg pain       Bilateral leg edema         1.  ESRD on PD.  Continue current PD Rx.  Uf 1400 last PM.  Volume status and lytes are stable. 2.  Anemia.  Likely due to CKD. Will give aranesp 3.  Secondary hyperpara.  Continue lanthanum for phosphate control 4.   Influenza B.  Improving.  Continue tamiflu    [employer], DO, FACP

## 2018-12-22 NOTE — Progress Notes (Signed)
Lower extremity venous duplex has been completed.   Preliminary results in CV Proc.   Abram Sander 12/22/2018 9:57 AM

## 2018-12-23 LAB — CBC WITH DIFFERENTIAL/PLATELET
Abs Immature Granulocytes: 0.06 10*3/uL (ref 0.00–0.07)
Basophils Absolute: 0.1 10*3/uL (ref 0.0–0.1)
Basophils Relative: 1 %
Eosinophils Absolute: 0.4 10*3/uL (ref 0.0–0.5)
Eosinophils Relative: 4 %
HCT: 30.1 % — ABNORMAL LOW (ref 36.0–46.0)
Hemoglobin: 9.7 g/dL — ABNORMAL LOW (ref 12.0–15.0)
Immature Granulocytes: 1 %
LYMPHS ABS: 1.4 10*3/uL (ref 0.7–4.0)
Lymphocytes Relative: 16 %
MCH: 32.9 pg (ref 26.0–34.0)
MCHC: 32.2 g/dL (ref 30.0–36.0)
MCV: 102 fL — ABNORMAL HIGH (ref 80.0–100.0)
Monocytes Absolute: 0.7 10*3/uL (ref 0.1–1.0)
Monocytes Relative: 8 %
Neutro Abs: 6 10*3/uL (ref 1.7–7.7)
Neutrophils Relative %: 70 %
Platelets: 218 10*3/uL (ref 150–400)
RBC: 2.95 MIL/uL — ABNORMAL LOW (ref 3.87–5.11)
RDW: 13.8 % (ref 11.5–15.5)
WBC: 8.6 10*3/uL (ref 4.0–10.5)
nRBC: 0 % (ref 0.0–0.2)

## 2018-12-23 LAB — BASIC METABOLIC PANEL
Anion gap: 10 (ref 5–15)
BUN: 38 mg/dL — ABNORMAL HIGH (ref 6–20)
CO2: 31 mmol/L (ref 22–32)
Calcium: 8.6 mg/dL — ABNORMAL LOW (ref 8.9–10.3)
Chloride: 94 mmol/L — ABNORMAL LOW (ref 98–111)
Creatinine, Ser: 10.52 mg/dL — ABNORMAL HIGH (ref 0.44–1.00)
GFR calc Af Amer: 4 mL/min — ABNORMAL LOW (ref >60)
GFR calc non Af Amer: 4 mL/min — ABNORMAL LOW (ref >60)
Glucose, Bld: 136 mg/dL — ABNORMAL HIGH (ref 70–99)
Potassium: 2.8 mmol/L — ABNORMAL LOW (ref 3.5–5.1)
SODIUM: 135 mmol/L (ref 135–145)

## 2018-12-23 LAB — GLUCOSE, CAPILLARY
GLUCOSE-CAPILLARY: 90 mg/dL (ref 70–99)
Glucose-Capillary: 132 mg/dL — ABNORMAL HIGH (ref 70–99)
Glucose-Capillary: 127 mg/dL — ABNORMAL HIGH (ref 70–99)

## 2018-12-23 LAB — HEPATITIS B SURFACE ANTIGEN: Hepatitis B Surface Ag: NEGATIVE

## 2018-12-23 MED ORDER — GLYCERIN (LAXATIVE) 2.1 G RE SUPP
1.0000 | Freq: Once | RECTAL | Status: AC
  Administered 2018-12-23: 1 via RECTAL
  Filled 2018-12-23: qty 1

## 2018-12-23 MED ORDER — HEPARIN 1000 UNIT/ML FOR PERITONEAL DIALYSIS
INTRAPERITONEAL | Status: DC | PRN
  Filled 2018-12-23: qty 3000

## 2018-12-23 MED ORDER — AMOXICILLIN-POT CLAVULANATE 875-125 MG PO TABS: 1.0000 | ORAL_TABLET | Freq: Two times a day (BID) | ORAL | Status: DC

## 2018-12-23 MED ORDER — AMOXICILLIN-POT CLAVULANATE 500-125 MG PO TABS
1.0000 | ORAL_TABLET | ORAL | Status: DC
  Administered 2018-12-23 – 2018-12-27 (×5): 500 mg via ORAL
  Filled 2018-12-23 (×5): qty 1

## 2018-12-23 MED ORDER — HEPARIN 1000 UNIT/ML FOR PERITONEAL DIALYSIS: 500.0000 [IU] | INTRAMUSCULAR | Status: DC | PRN

## 2018-12-23 MED ORDER — DELFLEX-LC/2.5% DEXTROSE 394 MOSM/L IP SOLN
INTRAPERITONEAL | Status: DC
  Administered 2018-12-23: 15000 mL via INTRAPERITONEAL
  Administered 2018-12-24: 19:00:00 via INTRAPERITONEAL

## 2018-12-23 MED ORDER — GENTAMICIN SULFATE 0.1 % EX CREA
1.0000 "application " | TOPICAL_CREAM | Freq: Every day | CUTANEOUS | Status: DC
  Administered 2018-12-23 – 2019-01-03 (×19): 1 via TOPICAL
  Filled 2018-12-23: qty 15

## 2018-12-23 MED ORDER — POTASSIUM CHLORIDE CRYS ER 20 MEQ PO TBCR
40.0000 meq | EXTENDED_RELEASE_TABLET | Freq: Once | ORAL | Status: AC
  Administered 2018-12-23: 40 meq via ORAL
  Filled 2018-12-23: qty 2

## 2018-12-23 NOTE — Progress Notes (Addendum)
PROGRESS NOTE    [name]  IRJ:[id number] DOB: 1966/09/03 DOA: 12/21/2018 PCP: [name], MD     Brief Narrative:  [name] is a 53 y.o. female with ESRD on PD presented to ED by EMS with complaints of 10/10 pain in bilateral legs and abdomen.  She feels like she might have a recurrence of cellulitis in her legs which she has had multiple times in the past. She reports that she had dialysis at home last night.  She reports that her legs have increased in size recently.  She has chronic lymphedema in both legs which is severe.  She reports abdominal pain in LLQ below her PD cath site.  She reports intermittent fever at home but no chills.  She denies nausea and vomiting.  She has been concerned about blood clots in legs as she is very immobile.  She tested positive for flu B and was placed on Tamiflu.  There was concern for peritonitis, patient underwent paracentesis which was negative for peritonitis.  Nephrology was consulted.  New events last 24 hours / Subjective: Continues to have left lower quadrant abdominal pain which is located more in her pannus, as well as right upper thigh pain.  She also admits to not having a bowel movement in over 10 days.  Denies any fevers or chills.  Assessment & Plan:   Principal Problem:   Influenza B Active Problems:   Lymphedema of lower extremity   Diabetes mellitus (HCC)   Morbid obesity (HCC)   HTN (hypertension)   Chronic diastolic CHF (congestive heart failure) (HCC)   ESRD on dialysis (HCC)   ESRD (end stage renal disease) (HCC)   Cor pulmonale, chronic (HCC)   Hypokalemia   Peritonitis (HCC)   Abdominal pain, LLQ   Influenza B Tamiflu received 35m dose, which is full 5 day course for pt on PD. Discussed with pharmacist. Droplet precaution  Concern for mild nonpurulent cellulitis LLQ abdominal wall Augmentin started   Constipation Ordered bowel regimen, suppository today   Bilateral leg pain DVT ultrasound was  negative  ESRD on peritoneal dialysis Nephrology consulted  Type 2 diabetes Levemir, sliding-scale insulin  Essential hypertension Continue Norvasc  Morbid obesity Body mass index is 60.02 kg/m.   Anxiety Continue Xanax  Hypokalemia Replace, trend   DVT prophylaxis: Subq hep Code Status: Full Family Communication:  No family at bedside Disposition Plan: Pending improvement in abdominal pain, leg pain   Consultants:   Nephrology  Procedures:   PD   Antimicrobials:  Anti-infectives (From admission, onward)   Start     Dose/Rate Route Frequency Ordered Stop   12/23/18 1200  amoxicillin-clavulanate (AUGMENTIN) 500-125 MG per tablet 500 mg     1 tablet Oral Every 24 hours 12/23/18 1022     12/23/18 1015  amoxicillin-clavulanate (AUGMENTIN) 875-125 MG per tablet 1 tablet  Status:  Discontinued     1 tablet Oral Every 12 hours 12/23/18 1014 12/23/18 1021   12/22/18 1000  oseltamivir (TAMIFLU) capsule 30 mg  Status:  Discontinued     30 mg Oral Daily 12/21/18 1631 12/21/18 1738   12/21/18 1700  cefTAZidime (FORTAZ) 2 g in sodium chloride 0.9 % 100 mL IVPB  Status:  Discontinued     2 g 200 mL/hr over 30 Minutes Intravenous  Once 12/21/18 1655 12/21/18 2016   12/21/18 1700  ceFAZolin (ANCEF) IVPB 1 g/50 mL premix  Status:  Discontinued     1 g 100 mL/hr over 30 Minutes  Intravenous  Once 12/21/18 1655 12/21/18 2016   12/21/18 1215  oseltamivir (TAMIFLU) capsule 30 mg     30 mg Oral  Once 12/21/18 1209 12/21/18 1307       Objective: Vitals:   12/22/18 1745 12/22/18 2148 12/23/18 0600 12/23/18 0913  BP: (!) 107/57 106/62 129/64 (!) 111/57  Pulse: 67 70 78 67  Resp: _0 Temp: 98.2 F (36.8 C) 98.5 F (36.9 C) 98.3 F (36.8 C) 98.6 F (37 C)  TempSrc: Oral Oral Oral Oral  SpO2: 100% 100% 99% 97%  Weight: (!) 163.6 kg (!) 163.6 kg    Height:        Intake/Output Summary (Last 24 hours) at 12/23/2018 1342 Last data filed at 12/23/2018 1031 Gross per 24  hour  Intake 510 ml  Output 20 ml  Net 490 ml   Filed Weights   12/21/18 1900 12/22/18 1745 12/22/18 2148  Weight: (!) 162.8 kg (!) 163.6 kg (!) 163.6 kg    Examination: General exam: Appears calm and comfortable  Respiratory system: Clear to auscultation. Respiratory effort normal. Cardiovascular system: S1 & S2 heard, RRR. No JVD, murmurs, rubs, gallops or clicks. No pedal edema. Gastrointestinal system: Abdomen is nondistended, soft  Central nervous system: Alert and oriented. No focal neurological deficits. Extremities: Symmetric  Skin: + Indurated skin of the left lower quadrant abdominal wall with edema, no significant erythema visible but is warm to touch Psychiatry: Judgement and insight appear normal. Mood & affect appropriate.    Data Reviewed: I have personally reviewed following labs and imaging studies  CBC: Recent Labs  Lab 12/21/18 1106 12/21/18 1652 12/22/18 0803 12/23/18 0651  WBC 10.8* 10.4 9.0 8.6  NEUTROABS 9.3*  --  6.4 6.0  HGB 10.2* 9.4* 9.7* 9.7*  HCT 33.5* 30.1* 29.1* 30.1*  MCV 102.8* 100.7* 99.0 102.0*  PLT 219 203 PLATELET CLUMPS NOTED ON SMEAR, UNABLE TO ESTIMATE 165   Basic Metabolic Panel: Recent Labs  Lab 12/21/18 1106 12/21/18 1134 12/21/18 1652 12/22/18 0803 12/23/18 0651  NA 132*  --   --  135 135  K 2.3*  --   --  4.1 2.8*  CL 91*  --   --  97* 94*  CO2 26  --   --  22 31  GLUCOSE 200*  --   --  142* 136*  BUN 42*  --   --  42* 38*  CREATININE 9.72*  --  10.48* 10.28* 10.52*  CALCIUM 8.4*  --   --  8.9 8.6*  MG  --  1.7  --  1.8  --   PHOS  --   --   --  6.4*  --    GFR: Estimated Creatinine Clearance: 9.8 mL/min (A) (by C-G formula based on SCr of 10.52 mg/dL (H)). Liver Function Tests: Recent Labs  Lab 12/21/18 1106 12/22/18 0803  AST 12*  --   ALT 21  --   ALKPHOS 113  --   BILITOT 0.4  --   PROT 7.6  --   ALBUMIN 3.2* 2.7*   Recent Labs  Lab 12/21/18 1106  LIPASE 66*   No results for input(s): AMMONIA  in the last 168 hours. Coagulation Profile: No results for input(s): INR, PROTIME in the last 168 hours. Cardiac Enzymes: No results for input(s): CKTOTAL, CKMB, CKMBINDEX, TROPONINI in the last 168 hours. BNP (last 3 results) No results for input(s): PROBNP in the last 8760 hours. HbA1C: No results for input(s):  HGBA1C in the last 72 hours. CBG: Recent Labs  Lab 12/22/18 1135 12/22/18 1642 12/22/18 2146 12/23/18 0728 12/23/18 1114  GLUCAP 92 99 152* 132* 127*   Lipid Profile: No results for input(s): CHOL, HDL, LDLCALC, TRIG, CHOLHDL, LDLDIRECT in the last 72 hours. Thyroid Function Tests: No results for input(s): TSH, T4TOTAL, FREET4, T3FREE, THYROIDAB in the last 72 hours. Anemia Panel: No results for input(s): VITAMINB12, FOLATE, FERRITIN, TIBC, IRON, RETICCTPCT in the last 72 hours. Sepsis Labs: No results for input(s): PROCALCITON, LATICACIDVEN in the last 168 hours.  Recent Results (from the past 240 hour(s))  Culture, blood (routine x 2)     Status: None (Preliminary result)   Collection Time: 12/21/18 11:06 AM  Result Value Ref Range Status   Specimen Description BLOOD BLOOD RIGHT WRIST  Final   Special Requests   Final    BOTTLES DRAWN AEROBIC ONLY Blood Culture results may not be optimal due to an inadequate volume of blood received in culture bottles   Culture   Final    NO GROWTH 2 DAYS Performed at [facility], [address], [place], [place] [address]    Report Status PENDING  Incomplete  MRSA PCR Screening     Status: None   Collection Time: 12/21/18  6:14 PM  Result Value Ref Range Status   MRSA by PCR NEGATIVE NEGATIVE Final    Comment:        The GeneXpert MRSA Assay (FDA approved for NASAL specimens only), is one component of a comprehensive MRSA colonization surveillance program. It is not intended to diagnose MRSA infection nor to guide or monitor treatment for MRSA infections. Performed at [facility], [place] [address],  [place], [place] [address]   Culture, body fluid-bottle     Status: None (Preliminary result)   Collection Time: 12/21/18  8:18 PM  Result Value Ref Range Status   Specimen Description FLUID PERITONEAL DIALYSATE  Final   Special Requests BOTTLES DRAWN AEROBIC AND ANAEROBIC  Final   Culture NO GROWTH 2 DAYS  Final   Report Status PENDING  Incomplete  Gram stain     Status: None   Collection Time: 12/21/18  8:18 PM  Result Value Ref Range Status   Specimen Description FLUID PERITONEAL DIALYSATE  Final   Special Requests NONE  Final   Gram Stain   Final    WBC PRESENT,BOTH PMN AND MONONUCLEAR NO ORGANISMS SEEN CYTOSPIN SMEAR Performed at [facility], [address] [place], [place] [address]    Report Status 12/21/2018 FINAL  Final       Radiology Studies: Vas [place] Lower Extremity Venous (dvt)  Result Date: 12/22/2018  Lower Venous Study Indications: Swelling, Edema, and Pain.  Limitations: Body habitus, poor ultrasound/tissue interface and pain tolerance. Performing Technologist: Abram Sander RVS  Examination Guidelines: A complete evaluation includes B-mode imaging, spectral Doppler, color Doppler, and power Doppler as needed of all accessible portions of each vessel. Bilateral testing is considered an integral part of a complete examination. Limited examinations for reoccurring indications may be performed as noted.  Right Venous Findings: +---------+---------------+---------+-----------+----------+--------------+          CompressibilityPhasicitySpontaneityPropertiesSummary        +---------+---------------+---------+-----------+----------+--------------+ CFV      Full           Yes      Yes                                 +---------+---------------+---------+-----------+----------+--------------+  SFJ      Full           Yes      Yes                                 +---------+---------------+---------+-----------+----------+--------------+ FV Prox                                                Not visualized +---------+---------------+---------+-----------+----------+--------------+ FV Mid                                                Not visualized +---------+---------------+---------+-----------+----------+--------------+ FV Distal                                             Not visualized +---------+---------------+---------+-----------+----------+--------------+ PFV                                                   Not visualized +---------+---------------+---------+-----------+----------+--------------+ POP                     Yes      Yes                                 +---------+---------------+---------+-----------+----------+--------------+ PTV                                                   Not visualized +---------+---------------+---------+-----------+----------+--------------+ PERO                                                  Not visualized +---------+---------------+---------+-----------+----------+--------------+  Left Venous Findings: +---------+---------------+---------+-----------+----------+--------------+          CompressibilityPhasicitySpontaneityPropertiesSummary        +---------+---------------+---------+-----------+----------+--------------+ CFV                                                   Not visualized +---------+---------------+---------+-----------+----------+--------------+ SFJ                                                   Not visualized +---------+---------------+---------+-----------+----------+--------------+ FV Prox  Not visualized +---------+---------------+---------+-----------+----------+--------------+ FV Mid                                                Not visualized +---------+---------------+---------+-----------+----------+--------------+ FV Distal                                              Not visualized +---------+---------------+---------+-----------+----------+--------------+ PFV                                                   Not visualized +---------+---------------+---------+-----------+----------+--------------+ POP                     Yes      Yes                                 +---------+---------------+---------+-----------+----------+--------------+ PTV                                                   Not visualized +---------+---------------+---------+-----------+----------+--------------+ PERO                                                  Not visualized +---------+---------------+---------+-----------+----------+--------------+    Summary: Right: There is no evidence of deep vein thrombosis in the lower extremity. However, portions of this examination were limited- see technologist comments above. No cystic structure found in the popliteal fossa. Left: There is no evidence of deep vein thrombosis in the lower extremity. However, portions of this examination were limited- see technologist comments above. No cystic structure found in the popliteal fossa.  *See table(s) above for measurements and observations.    Preliminary       Scheduled Meds: . amLODipine  5 mg Oral Daily  . amoxicillin-clavulanate  1 tablet Oral Q24H  . gentamicin cream  1 application Topical Daily  . heparin  5,000 Units Subcutaneous Q8H  . insulin aspart  0-20 Units Subcutaneous TID WC  . insulin detemir  10 Units Subcutaneous Daily  . lanthanum  1,500 mg Oral TID WC  . multivitamin  1 tablet Oral Daily  . polyethylene glycol  17 g Oral TID  . potassium chloride SA  40 mEq Oral Daily  . senna-docusate  1 tablet Oral BID  . sodium chloride flush  3 mL Intravenous Q12H   Continuous Infusions: . sodium chloride    . dialysis solution 2.5% low-MG/low-CA       LOS: 2 days    Time spent: 44mnutes   JDessa Phi DO [facility] [email or web address] Password [email or web address] 12/23/2018, 1:42 PM

## 2018-12-23 NOTE — Progress Notes (Signed)
[facility]    NEPHROLOGY PROGRESS NOTE  SUBJECTIVE: Improved shortness of breath.  PD last night without difficulty.  Denies cough or fever.  No n/v.  No headaches.  Complaining of a painful skin rash in her pannus.  Otherwise no acute complaints.   OBJECTIVE:  Vitals:   12/23/18 0600 12/23/18 0913  BP: 129/64 (!) 111/57  Pulse: 78 67  Resp: 18 19  Temp: 98.3 F (36.8 C) 98.6 F (37 C)  SpO2: 99% 97%   I/O last 3 completed shifts: In: 96222 [P.O.:660; I.V.:3; Other:13503] Out: 97989 [Urine:20; QJJHE:17408]   Genearl:  AAOx3 NAD HEENT: MMM [place] AT anicteric sclera Neck:  No JVD, no adenopathy CV:  Heart RRR  Lungs:  L/S CTA bilaterally Abd:  abd SNT/ND with normal BS, obese, LLQ pd catheter GU:  Bladder non-palpable Extremities:  Bilateral LE lymphedema Skin:  (+)rash in pannus  MEDICATIONS:   Current Facility-Administered Medications:  .  0.9 %  sodium chloride infusion, 250 mL, Intravenous, PRN, Johnson, Clanford L, MD .  acetaminophen (TYLENOL) tablet 650 mg, 650 mg, Oral, Q6H PRN, Johnson, Clanford L, MD .  ALPRAZolam Duanne Moron) tablet 0.5 mg, 0.5 mg, Oral, TID PRN, Johnson, Clanford L, MD .  amLODipine (NORVASC) tablet 5 mg, 5 mg, Oral, Daily, Johnson, Clanford L, MD, 5 mg at 12/23/18 1129 .  amoxicillin-clavulanate (AUGMENTIN) 500-125 MG per tablet 500 mg, 1 tablet, Oral, Q24H, Dessa Phi, DO, 500 mg at 12/23/18 1129 .  dialysis solution 2.5% low-MG/low-CA dianeal solution, , Intraperitoneal, Q24H, Johnson, Clanford L, MD .  gentamicin cream (GARAMYCIN) 0.1 % 1 application, 1 application, Topical, Daily, Justin Mend, MD, 1 application at 14/48/18 1754 .  heparin 2,500 Units in dialysis solution 2.5% low-MG/low-CA 5,000 mL dialysis solution, , Peritoneal Dialysis, PRN, Johnson, Clanford L, MD .  heparin injection 5,000 Units, 5,000 Units, Subcutaneous, Q8H, Johnson, Clanford L, MD, 5,000 Units at 12/23/18 0603 .  insulin aspart (novoLOG)  injection 0-20 Units, 0-20 Units, Subcutaneous, TID WC, Johnson, Clanford L, MD, 3 Units at 12/23/18 0850 .  insulin detemir (LEVEMIR) injection 10 Units, 10 Units, Subcutaneous, Daily, Johnson, Clanford L, MD, 10 Units at 12/23/18 1130 .  lanthanum (FOSRENOL) chewable tablet 1,000 mg, 1,000 mg, Oral, BID PRN, Hammons, Theone Murdoch, RPH .  lanthanum (FOSRENOL) chewable tablet 1,500 mg, 1,500 mg, Oral, TID WC, Johnson, Clanford L, MD, 1,500 mg at 12/23/18 0848 .  multivitamin (RENA-VIT) tablet 1 tablet, 1 tablet, Oral, Daily, Johnson, Clanford L, MD, 1 tablet at 12/23/18 1129 .  oxyCODONE (Oxy IR/ROXICODONE) immediate release tablet 10 mg, 10 mg, Oral, QID PRN, Johnson, Clanford L, MD, 10 mg at 12/23/18 1134 .  polyethylene glycol (MIRALAX / GLYCOLAX) packet 17 g, 17 g, Oral, TID, Justin Mend, MD, 17 g at 12/23/18 1128 .  polyethylene glycol (MIRALAX / GLYCOLAX) packet 17 g, 17 g, Oral, Daily PRN, Justin Mend, MD .  potassium chloride SA (K-DUR,KLOR-CON) CR tablet 40 mEq, 40 mEq, Oral, Daily, Johnson, Clanford L, MD, 40 mEq at 12/23/18 1129 .  promethazine (PHENERGAN) tablet 12.5 mg, 12.5 mg, Oral, Q6H PRN, Johnson, Clanford L, MD, 12.5 mg at 12/22/18 0640 .  senna-docusate (Senokot-S) tablet 1 tablet, 1 tablet, Oral, BID, Dessa Phi, DO, 1 tablet at 12/23/18 1129 .  sodium chloride flush (NS) 0.9 % injection 3 mL, 3 mL, Intravenous, Q12H, Johnson, Clanford L, MD, 3 mL at 12/22/18 2158 .  sodium chloride flush (NS) 0.9 % injection 3 mL, 3 mL, Intravenous,  PRN, Murlean Iba, MD     LABS:  CBC Latest Ref Rng & Units 12/23/2018 12/22/2018 12/21/2018  WBC 4.0 - 10.5 K/uL 8.6 9.0 10.4  Hemoglobin 12.0 - 15.0 g/dL 9.7(L) 9.7(L) 9.4(L)  Hematocrit 36.0 - 46.0 % 30.1(L) 29.1(L) 30.1(L)  Platelets 150 - 400 K/uL 218 PLATELET CLUMPS NOTED ON SMEAR, UNABLE TO ESTIMATE 203    CMP Latest Ref Rng & Units 12/23/2018 12/22/2018 12/21/2018  Glucose 70 - 99 mg/dL 136(H) 142(H) -  BUN 6 - 20 mg/dL  38(H) 42(H) -  Creatinine 0.44 - 1.00 mg/dL 10.52(H) 10.28(H) 10.48(H)  Sodium 135 - 145 mmol/L 135 135 -  Potassium 3.5 - 5.1 mmol/L 2.8(L) 4.1 -  Chloride 98 - 111 mmol/L 94(L) 97(L) -  CO2 22 - 32 mmol/L 31 22 -  Calcium 8.9 - 10.3 mg/dL 8.6(L) 8.9 -  Total Protein 6.5 - 8.1 g/dL - - -  Total Bilirubin 0.3 - 1.2 mg/dL - - -  Alkaline Phos 38 - 126 U/L - - -  AST 15 - 41 U/L - - -  ALT 0 - 44 U/L - - -    Lab Results  Component Value Date   PTH 127 (H) 11/29/2015   CALCIUM 8.6 (L) 12/23/2018   CAION 1.06 (L) 07/26/2017   PHOS 6.4 (H) 12/22/2018       Component Value Date/Time   COLORURINE YELLOW 08/11/2016 1800   APPEARANCEUR HAZY (A) 08/11/2016 1800   LABSPEC 1.020 08/11/2016 1800   PHURINE 5.5 08/11/2016 1800   GLUCOSEU NEGATIVE 08/11/2016 1800   HGBUR MODERATE (A) 08/11/2016 1800   BILIRUBINUR NEGATIVE 08/11/2016 1800   KETONESUR NEGATIVE 08/11/2016 1800   PROTEINUR 100 (A) 08/11/2016 1800   UROBILINOGEN 0.2 12/02/2014 1925   NITRITE NEGATIVE 08/11/2016 1800   LEUKOCYTESUR NEGATIVE 08/11/2016 1800      Component Value Date/Time   PHART 7.290 (L) 01/24/2017 1140   PCO2ART 41.0 01/24/2017 1140   PO2ART 85 01/24/2017 1140   HCO3 29.2 (H) 08/04/2017 0751   TCO2 31 08/04/2017 0751   ACIDBASEDEF 6.3 (H) 01/24/2017 1140   O2SAT 71.0 08/04/2017 0751       Component Value Date/Time   IRON 40 11/29/2015 0410   TIBC 277 11/29/2015 0410   FERRITIN 2214 (H) 11/13/2011 0500   IRONPCTSAT 14 11/29/2015 0410       ASSESSMENT/PLAN:     Problem List Items Addressed This Visit      Respiratory   * (Principal) Influenza B - Primary   Relevant Medications   oseltamivir (TAMIFLU) capsule 30 mg (Completed)   gentamicin cream (GARAMYCIN) 0.1 % 1 application     Other   Hypokalemia   Peritonitis (HCC)    Other Visit Diagnoses    Bilateral leg pain       Bilateral leg edema         53 year old female patient with a history of end-stage renal disease on peritoneal  dialysis presents with influenza B.   1.  ESRD on PD.  Continue current PD Rx.   Volume status stable.  Will add oral potassium.  2.  Anemia.  Likely due to CKD.  Aranesp x1 on 01/18/2018. Is iron replete 3.  Secondary hyperpara.  Continue lanthanum for phosphate control 4.  Influenza B.  Improving.  Continue tamiflu 5.  Hypokalemia.  Likely secondary to peritoneal dialysis.  Agree with oral potassium.  We will add additional dose today.    [place], DO, [place]

## 2018-12-24 LAB — BASIC METABOLIC PANEL
Anion gap: 14 (ref 5–15)
BUN: 40 mg/dL — ABNORMAL HIGH (ref 6–20)
CO2: 25 mmol/L (ref 22–32)
Calcium: 8.5 mg/dL — ABNORMAL LOW (ref 8.9–10.3)
Chloride: 96 mmol/L — ABNORMAL LOW (ref 98–111)
Creatinine, Ser: 10.98 mg/dL — ABNORMAL HIGH (ref 0.44–1.00)
GFR calc Af Amer: 4 mL/min — ABNORMAL LOW (ref >60)
GFR calc non Af Amer: 4 mL/min — ABNORMAL LOW (ref >60)
Glucose, Bld: 130 mg/dL — ABNORMAL HIGH (ref 70–99)
Potassium: 3.1 mmol/L — ABNORMAL LOW (ref 3.5–5.1)
Sodium: 135 mmol/L (ref 135–145)

## 2018-12-24 LAB — CBC WITH DIFFERENTIAL/PLATELET
Abs Immature Granulocytes: 0.12 10*3/uL — ABNORMAL HIGH (ref 0.00–0.07)
BASOS PCT: 1 %
Basophils Absolute: 0 10*3/uL (ref 0.0–0.1)
EOS PCT: 4 %
Eosinophils Absolute: 0.3 10*3/uL (ref 0.0–0.5)
HCT: 28.8 % — ABNORMAL LOW (ref 36.0–46.0)
Hemoglobin: 9.4 g/dL — ABNORMAL LOW (ref 12.0–15.0)
Immature Granulocytes: 1 %
Lymphocytes Relative: 17 %
Lymphs Abs: 1.5 10*3/uL (ref 0.7–4.0)
MCH: 33.2 pg (ref 26.0–34.0)
MCHC: 32.6 g/dL (ref 30.0–36.0)
MCV: 101.8 fL — ABNORMAL HIGH (ref 80.0–100.0)
Monocytes Absolute: 0.7 10*3/uL (ref 0.1–1.0)
Monocytes Relative: 8 %
Neutro Abs: 6.1 10*3/uL (ref 1.7–7.7)
Neutrophils Relative %: 69 %
PLATELETS: 203 10*3/uL (ref 150–400)
RBC: 2.83 MIL/uL — ABNORMAL LOW (ref 3.87–5.11)
RDW: 13.6 % (ref 11.5–15.5)
WBC: 8.9 10*3/uL (ref 4.0–10.5)
nRBC: 0 % (ref 0.0–0.2)

## 2018-12-24 LAB — GLUCOSE, CAPILLARY
Glucose-Capillary: 127 mg/dL — ABNORMAL HIGH (ref 70–99)
Glucose-Capillary: 128 mg/dL — ABNORMAL HIGH (ref 70–99)
Glucose-Capillary: 111 mg/dL — ABNORMAL HIGH (ref 70–99)
Glucose-Capillary: 161 mg/dL — ABNORMAL HIGH (ref 70–99)

## 2018-12-24 MED ORDER — POTASSIUM CHLORIDE CRYS ER 20 MEQ PO TBCR
40.0000 meq | EXTENDED_RELEASE_TABLET | Freq: Once | ORAL | Status: AC
  Administered 2018-12-24: 40 meq via ORAL
  Filled 2018-12-24: qty 2

## 2018-12-24 NOTE — Progress Notes (Signed)
[facility]    NEPHROLOGY PROGRESS NOTE  SUBJECTIVE: Feels better.  Rash on pannus and legs is improving with antibiotics.  PD last night without difficulty.  Denies cough or fever.  No n/v.  No headaches.   Otherwise no acute complaints.   OBJECTIVE:  Vitals:   12/24/18 0740 12/24/18 1613  BP: (!) 104/92 (!) 113/56  Pulse: 72 70  Resp: 18 20  Temp: 98.3 F (36.8 C) 98.7 F (37.1 C)  SpO2: 99% 97%   I/O last 3 completed shifts: In: 21 [P.O.:990] Out: 34 [Urine:20]   Genearl:  AAOx3 NAD HEENT: MMM [place] AT anicteric sclera Neck:  No JVD, no adenopathy CV:  Heart RRR  Lungs:  L/S CTA bilaterally Abd:  abd SNT/ND with normal BS, obese, LLQ pd catheter GU:  Bladder non-palpable Extremities:  Bilateral LE lymphedema Skin:  (+)rash in pannus-improving  MEDICATIONS:   Current Facility-Administered Medications:  .  0.9 %  sodium chloride infusion, 250 mL, Intravenous, PRN, Johnson, Clanford L, MD .  acetaminophen (TYLENOL) tablet 650 mg, 650 mg, Oral, Q6H PRN, Johnson, Clanford L, MD .  ALPRAZolam Duanne Moron) tablet 0.5 mg, 0.5 mg, Oral, TID PRN, Johnson, Clanford L, MD .  amLODipine (NORVASC) tablet 5 mg, 5 mg, Oral, Daily, Johnson, Clanford L, MD, 5 mg at 12/24/18 1107 .  amoxicillin-clavulanate (AUGMENTIN) 500-125 MG per tablet 500 mg, 1 tablet, Oral, Q24H, Dessa Phi, DO, 500 mg at 12/24/18 1108 .  dialysis solution 2.5% low-MG/low-CA dianeal solution, , Intraperitoneal, Q24H, Dejia Ebron A, DO, 15,000 mL at 12/23/18 1853 .  gentamicin cream (GARAMYCIN) 0.1 % 1 application, 1 application, Topical, Daily, Quamel Fitzmaurice A, DO, 1 application at 01/77/93 1108 .  heparin 1,500 Units in dialysis solution 2.5% low-MG/low-CA 3,000 mL dialysis solution, , Peritoneal Dialysis, PRN, Amaia Lavallie A, DO .  heparin injection 5,000 Units, 5,000 Units, Subcutaneous, Q8H, Johnson, Clanford L, MD, 5,000 Units at 12/24/18 1449 .  insulin aspart (novoLOG) injection 0-20  Units, 0-20 Units, Subcutaneous, TID WC, Johnson, Clanford L, MD, 3 Units at 12/24/18 1211 .  insulin detemir (LEVEMIR) injection 10 Units, 10 Units, Subcutaneous, Daily, Wynetta Emery, Clanford L, MD, 10 Units at 12/24/18 1107 .  lanthanum (FOSRENOL) chewable tablet 1,000 mg, 1,000 mg, Oral, BID PRN, Hammons, Theone Murdoch, RPH .  lanthanum (FOSRENOL) chewable tablet 1,500 mg, 1,500 mg, Oral, TID WC, Johnson, Clanford L, MD, 1,500 mg at 12/24/18 1709 .  multivitamin (RENA-VIT) tablet 1 tablet, 1 tablet, Oral, Daily, Johnson, Clanford L, MD, 1 tablet at 12/24/18 1107 .  oxyCODONE (Oxy IR/ROXICODONE) immediate release tablet 10 mg, 10 mg, Oral, QID PRN, Wynetta Emery, Clanford L, MD, 10 mg at 12/24/18 9030 .  polyethylene glycol (MIRALAX / GLYCOLAX) packet 17 g, 17 g, Oral, Daily PRN, Justin Mend, MD, 17 g at 12/24/18 1108 .  potassium chloride SA (K-DUR,KLOR-CON) CR tablet 40 mEq, 40 mEq, Oral, Daily, Johnson, Clanford L, MD, 40 mEq at 12/24/18 1107 .  promethazine (PHENERGAN) tablet 12.5 mg, 12.5 mg, Oral, Q6H PRN, Johnson, Clanford L, MD, 12.5 mg at 12/22/18 0640 .  senna-docusate (Senokot-S) tablet 1 tablet, 1 tablet, Oral, BID, Dessa Phi, DO, 1 tablet at 12/24/18 1107 .  sodium chloride flush (NS) 0.9 % injection 3 mL, 3 mL, Intravenous, Q12H, Johnson, Clanford L, MD, 3 mL at 12/23/18 2212 .  sodium chloride flush (NS) 0.9 % injection 3 mL, 3 mL, Intravenous, PRN, Johnson, Clanford L, MD     LABS:  CBC Latest Ref Rng &  Units 12/24/2018 12/23/2018 12/22/2018  WBC 4.0 - 10.5 K/uL 8.9 8.6 9.0  Hemoglobin 12.0 - 15.0 g/dL 9.4(L) 9.7(L) 9.7(L)  Hematocrit 36.0 - 46.0 % 28.8(L) 30.1(L) 29.1(L)  Platelets 150 - 400 K/uL 203 218 PLATELET CLUMPS NOTED ON SMEAR, UNABLE TO ESTIMATE    CMP Latest Ref Rng & Units 12/24/2018 12/23/2018 12/22/2018  Glucose 70 - 99 mg/dL 130(H) 136(H) 142(H)  BUN 6 - 20 mg/dL 40(H) 38(H) 42(H)  Creatinine 0.44 - 1.00 mg/dL 10.98(H) 10.52(H) 10.28(H)  Sodium 135 - 145 mmol/L 135 135  135  Potassium 3.5 - 5.1 mmol/L 3.1(L) 2.8(L) 4.1  Chloride 98 - 111 mmol/L 96(L) 94(L) 97(L)  CO2 22 - 32 mmol/L _0 Calcium 8.9 - 10.3 mg/dL 8.5(L) 8.6(L) 8.9  Total Protein 6.5 - 8.1 g/dL - - -  Total Bilirubin 0.3 - 1.2 mg/dL - - -  Alkaline Phos 38 - 126 U/L - - -  AST 15 - 41 U/L - - -  ALT 0 - 44 U/L - - -    Lab Results  Component Value Date   PTH 127 (H) 11/29/2015   CALCIUM 8.5 (L) 12/24/2018   CAION 1.06 (L) 07/26/2017   PHOS 6.4 (H) 12/22/2018       Component Value Date/Time   COLORURINE YELLOW 08/11/2016 1800   APPEARANCEUR HAZY (A) 08/11/2016 1800   LABSPEC 1.020 08/11/2016 1800   PHURINE 5.5 08/11/2016 1800   GLUCOSEU NEGATIVE 08/11/2016 1800   HGBUR MODERATE (A) 08/11/2016 1800   BILIRUBINUR NEGATIVE 08/11/2016 1800   KETONESUR NEGATIVE 08/11/2016 1800   PROTEINUR 100 (A) 08/11/2016 1800   UROBILINOGEN 0.2 12/02/2014 1925   NITRITE NEGATIVE 08/11/2016 1800   LEUKOCYTESUR NEGATIVE 08/11/2016 1800      Component Value Date/Time   PHART 7.290 (L) 01/24/2017 1140   PCO2ART 41.0 01/24/2017 1140   PO2ART 85 01/24/2017 1140   HCO3 29.2 (H) 08/04/2017 0751   TCO2 31 08/04/2017 0751   ACIDBASEDEF 6.3 (H) 01/24/2017 1140   O2SAT 71.0 08/04/2017 0751       Component Value Date/Time   IRON 40 11/29/2015 0410   TIBC 277 11/29/2015 0410   FERRITIN 2214 (H) 11/13/2011 0500   IRONPCTSAT 14 11/29/2015 0410       ASSESSMENT/PLAN:     Problem List Items Addressed This Visit      Respiratory   * (Principal) Influenza B - Primary   Relevant Medications   oseltamivir (TAMIFLU) capsule 30 mg (Completed)   gentamicin cream (GARAMYCIN) 0.1 % 1 application     Other   Hypokalemia   Peritonitis (HCC)    Other Visit Diagnoses    Bilateral leg pain       Bilateral leg edema         53 year old female patient with a history of end-stage renal disease on peritoneal dialysis presents with influenza B.   1.  ESRD on PD.  Continue current PD Rx.   Volume  status stable.  Will add oral potassium.  2.  Anemia.  Likely due to CKD.  Aranesp x1 on 12/22/2017. Is iron replete 3.  Secondary hyperpara.  Continue lanthanum for phosphate control 4.  Influenza B.  Improving.   5.  Hypokalemia.  Likely secondary to peritoneal dialysis.  Agree with oral potassium.  We will add additional dose today.    [place], DO, [place]

## 2018-12-24 NOTE — Progress Notes (Signed)
PD tx initiated via tenckhoff w/o problem VSS Report given to Geanie Kenning, RN

## 2018-12-24 NOTE — Progress Notes (Signed)
PROGRESS NOTE    [name]  VXB:[id number] DOB: July 15, 1966 DOA: 12/21/2018 PCP: [name], MD     Brief Narrative:  [name] is a 53 y.o. female with ESRD on PD presented to ED by EMS with complaints of 10/10 pain in bilateral legs and abdomen.  She feels like she might have a recurrence of cellulitis in her legs which she has had multiple times in the past. She reports that she had dialysis at home last night.  She reports that her legs have increased in size recently.  She has chronic lymphedema in both legs which is severe.  She reports abdominal pain in LLQ below her PD cath site.  She reports intermittent fever at home but no chills.  She denies nausea and vomiting.  She has been concerned about blood clots in legs as she is very immobile.  She tested positive for flu B and was placed on Tamiflu.  There was concern for peritonitis, patient underwent paracentesis which was negative for peritonitis.  Nephrology was consulted.  New events last 24 hours / Subjective: She states that her left lower quadrant abdominal wall pain has improved, but continues to hurt with light palpation.  Right upper thigh pain has nearly resolved.  Had a very small bowel movement yesterday for suppository.  Breathing stable.  Assessment & Plan:   Principal Problem:   Influenza B Active Problems:   Lymphedema of lower extremity   Diabetes mellitus (HCC)   Morbid obesity (HCC)   HTN (hypertension)   Chronic diastolic CHF (congestive heart failure) (HCC)   ESRD on dialysis (HCC)   ESRD (end stage renal disease) (HCC)   Cor pulmonale, chronic (HCC)   Hypokalemia   Peritonitis (HCC)   Abdominal pain, LLQ   Influenza B Tamiflu received 33m dose, which is full 5 day course for pt on PD. Discussed with pharmacist. Droplet precaution  Concern for mild nonpurulent cellulitis LLQ abdominal wall Augmentin started   Constipation Had a very small bowel movement yesterday, on bowel  regimen  Bilateral leg pain DVT ultrasound was negative  ESRD on peritoneal dialysis Nephrology consulted  Type 2 diabetes Levemir, sliding-scale insulin  Essential hypertension Continue Norvasc  Morbid obesity Body mass index is 58.99 kg/m.   Anxiety Continue Xanax  Hypokalemia Replace, trend   DVT prophylaxis: Subq hep Code Status: Full Family Communication:  No family at bedside Disposition Plan: Pending further improvement in abdominal wall cellulitis   Consultants:   Nephrology  Procedures:   PD   Antimicrobials:  Anti-infectives (From admission, onward)   Start     Dose/Rate Route Frequency Ordered Stop   12/23/18 1200  amoxicillin-clavulanate (AUGMENTIN) 500-125 MG per tablet 500 mg     1 tablet Oral Every 24 hours 12/23/18 1022     12/23/18 1015  amoxicillin-clavulanate (AUGMENTIN) 875-125 MG per tablet 1 tablet  Status:  Discontinued     1 tablet Oral Every 12 hours 12/23/18 1014 12/23/18 1021   12/22/18 1000  oseltamivir (TAMIFLU) capsule 30 mg  Status:  Discontinued     30 mg Oral Daily 12/21/18 1631 12/21/18 1738   12/21/18 1700  cefTAZidime (FORTAZ) 2 g in sodium chloride 0.9 % 100 mL IVPB  Status:  Discontinued     2 g 200 mL/hr over 30 Minutes Intravenous  Once 12/21/18 1655 12/21/18 2016   12/21/18 1700  ceFAZolin (ANCEF) IVPB 1 g/50 mL premix  Status:  Discontinued     1 g 100 mL/hr over  30 Minutes Intravenous  Once 12/21/18 1655 12/21/18 2016   12/21/18 1215  oseltamivir (TAMIFLU) capsule 30 mg     30 mg Oral  Once 12/21/18 1209 12/21/18 1307       Objective: Vitals:   12/23/18 2159 12/24/18 0404 12/24/18 0737 12/24/18 0740  BP: 117/66 119/64 (!) 107/59 (!) 104/92  Pulse: 73 72 72 72  Resp: _0 Temp: 98.4 F (36.9 C) 98.2 F (36.8 C) 98.6 F (37 C) 98.3 F (36.8 C)  TempSrc: Oral Oral Oral Oral  SpO2: 100% 97% 95% 99%  Weight: (!) 160.8 kg     Height:        Intake/Output Summary (Last 24 hours) at 12/24/2018  1420 Last data filed at 12/24/2018 4496 Gross per 24 hour  Intake 720 ml  Output 0 ml  Net 720 ml   Filed Weights   12/22/18 1745 12/22/18 2148 12/23/18 2159  Weight: (!) 163.6 kg (!) 163.2 kg (!) 160.8 kg     Examination: General exam: Appears calm and comfortable  Respiratory system: Clear to auscultation. Respiratory effort normal. Cardiovascular system: S1 & S2 heard, RRR. No JVD, murmurs, rubs, gallops or clicks. No pedal edema. Gastrointestinal system: Abdomen is nondistended, soft  Central nervous system: Alert and oriented. No focal neurological deficits. Extremities: Symmetric 5 x 5 power. Skin: + Indurated skin with chronic skin changes of left lower quadrant abdominal wall, with edema, no significant erythema, warm to touch Psychiatry: Judgement and insight appear normal. Mood & affect appropriate.    Data Reviewed: I have personally reviewed following labs and imaging studies  CBC: Recent Labs  Lab 12/21/18 1106 12/21/18 1652 12/22/18 0803 12/23/18 0651 12/24/18 0550  WBC 10.8* 10.4 9.0 8.6 8.9  NEUTROABS 9.3*  --  6.4 6.0 6.1  HGB 10.2* 9.4* 9.7* 9.7* 9.4*  HCT 33.5* 30.1* 29.1* 30.1* 28.8*  MCV 102.8* 100.7* 99.0 102.0* 101.8*  PLT 219 203 PLATELET CLUMPS NOTED ON SMEAR, UNABLE TO ESTIMATE 218 759   Basic Metabolic Panel: Recent Labs  Lab 12/21/18 1106 12/21/18 1134 12/21/18 1652 12/22/18 0803 12/23/18 0651 12/24/18 0550  NA 132*  --   --  135 135 135  K 2.3*  --   --  4.1 2.8* 3.1*  CL 91*  --   --  97* 94* 96*  CO2 26  --   --  _1 GLUCOSE 200*  --   --  142* 136* 130*  BUN 42*  --   --  42* 38* 40*  CREATININE 9.72*  --  10.48* 10.28* 10.52* 10.98*  CALCIUM 8.4*  --   --  8.9 8.6* 8.5*  MG  --  1.7  --  1.8  --   --   PHOS  --   --   --  6.4*  --   --    GFR: Estimated Creatinine Clearance: 9.3 mL/min (A) (by C-G formula based on SCr of 10.98 mg/dL (H)). Liver Function Tests: Recent Labs  Lab 12/21/18 1106 12/22/18 0803  AST 12*   --   ALT 21  --   ALKPHOS 113  --   BILITOT 0.4  --   PROT 7.6  --   ALBUMIN 3.2* 2.7*   Recent Labs  Lab 12/21/18 1106  LIPASE 66*   No results for input(s): AMMONIA in the last 168 hours. Coagulation Profile: No results for input(s): INR, PROTIME in the last 168 hours. Cardiac Enzymes: No results for input(s):  CKTOTAL, CKMB, CKMBINDEX, TROPONINI in the last 168 hours. BNP (last 3 results) No results for input(s): PROBNP in the last 8760 hours. HbA1C: No results for input(s): HGBA1C in the last 72 hours. CBG: Recent Labs  Lab 12/23/18 0728 12/23/18 1114 12/23/18 1632 12/24/18 0736 12/24/18 1118  GLUCAP 132* 127* 90 127* 128*   Lipid Profile: No results for input(s): CHOL, HDL, LDLCALC, TRIG, CHOLHDL, LDLDIRECT in the last 72 hours. Thyroid Function Tests: No results for input(s): TSH, T4TOTAL, FREET4, T3FREE, THYROIDAB in the last 72 hours. Anemia Panel: No results for input(s): VITAMINB12, FOLATE, FERRITIN, TIBC, IRON, RETICCTPCT in the last 72 hours. Sepsis Labs: No results for input(s): PROCALCITON, LATICACIDVEN in the last 168 hours.  Recent Results (from the past 240 hour(s))  Culture, blood (routine x 2)     Status: None (Preliminary result)   Collection Time: 12/21/18 11:06 AM  Result Value Ref Range Status   Specimen Description BLOOD BLOOD RIGHT WRIST  Final   Special Requests   Final    BOTTLES DRAWN AEROBIC ONLY Blood Culture results may not be optimal due to an inadequate volume of blood received in culture bottles   Culture   Final    NO GROWTH 3 DAYS Performed at [facility], [address], [place], [place] [address]    Report Status PENDING  Incomplete  MRSA PCR Screening     Status: None   Collection Time: 12/21/18  6:14 PM  Result Value Ref Range Status   MRSA by PCR NEGATIVE NEGATIVE Final    Comment:        The GeneXpert MRSA Assay (FDA approved for NASAL specimens only), is one component of a comprehensive MRSA  colonization surveillance program. It is not intended to diagnose MRSA infection nor to guide or monitor treatment for MRSA infections. Performed at [facility], [place] [address] [place], [place] [address]   Culture, body fluid-bottle     Status: None (Preliminary result)   Collection Time: 12/21/18  8:18 PM  Result Value Ref Range Status   Specimen Description FLUID PERITONEAL DIALYSATE  Final   Special Requests BOTTLES DRAWN AEROBIC AND ANAEROBIC  Final   Culture NO GROWTH 3 DAYS  Final   Report Status PENDING  Incomplete  Gram stain     Status: None   Collection Time: 12/21/18  8:18 PM  Result Value Ref Range Status   Specimen Description FLUID PERITONEAL DIALYSATE  Final   Special Requests NONE  Final   Gram Stain   Final    WBC PRESENT,BOTH PMN AND MONONUCLEAR NO ORGANISMS SEEN CYTOSPIN SMEAR Performed at [facility], [address] [place], [place] [address]    Report Status 12/21/2018 FINAL  Final       Radiology Studies: No results found.    Scheduled Meds: . amLODipine  5 mg Oral Daily  . amoxicillin-clavulanate  1 tablet Oral Q24H  . gentamicin cream  1 application Topical Daily  . heparin  5,000 Units Subcutaneous Q8H  . insulin aspart  0-20 Units Subcutaneous TID WC  . insulin detemir  10 Units Subcutaneous Daily  . lanthanum  1,500 mg Oral TID WC  . multivitamin  1 tablet Oral Daily  . potassium chloride SA  40 mEq Oral Daily  . senna-docusate  1 tablet Oral BID  . sodium chloride flush  3 mL Intravenous Q12H   Continuous Infusions: . sodium chloride    . dialysis solution 2.5% low-MG/low-CA  LOS: 3 days    Time spent: 54mnutes   JDessa Phi DO [facility] [email or web address] Password [email or web address] 12/24/2018, 2:20 PM

## 2018-12-25 LAB — BASIC METABOLIC PANEL
Anion gap: 14 (ref 5–15)
BUN: 41 mg/dL — ABNORMAL HIGH (ref 6–20)
CO2: 22 mmol/L (ref 22–32)
Calcium: 9 mg/dL (ref 8.9–10.3)
Chloride: 97 mmol/L — ABNORMAL LOW (ref 98–111)
Creatinine, Ser: 10.65 mg/dL — ABNORMAL HIGH (ref 0.44–1.00)
GFR calc Af Amer: 4 mL/min — ABNORMAL LOW (ref >60)
GFR calc non Af Amer: 4 mL/min — ABNORMAL LOW (ref >60)
Glucose, Bld: 146 mg/dL — ABNORMAL HIGH (ref 70–99)
Potassium: 3.9 mmol/L (ref 3.5–5.1)
Sodium: 133 mmol/L — ABNORMAL LOW (ref 135–145)

## 2018-12-25 LAB — CBC WITH DIFFERENTIAL/PLATELET
ABS IMMATURE GRANULOCYTES: 0.11 10*3/uL — AB (ref 0.00–0.07)
BASOS PCT: 0 %
Basophils Absolute: 0 10*3/uL (ref 0.0–0.1)
Eosinophils Absolute: 0.3 10*3/uL (ref 0.0–0.5)
Eosinophils Relative: 4 %
HCT: 29.8 % — ABNORMAL LOW (ref 36.0–46.0)
Hemoglobin: 9.8 g/dL — ABNORMAL LOW (ref 12.0–15.0)
Immature Granulocytes: 1 %
Lymphocytes Relative: 16 %
Lymphs Abs: 1.3 10*3/uL (ref 0.7–4.0)
MCH: 33.1 pg (ref 26.0–34.0)
MCHC: 32.9 g/dL (ref 30.0–36.0)
MCV: 100.7 fL — ABNORMAL HIGH (ref 80.0–100.0)
Monocytes Absolute: 0.7 10*3/uL (ref 0.1–1.0)
Monocytes Relative: 9 %
Neutro Abs: 6.1 10*3/uL (ref 1.7–7.7)
Neutrophils Relative %: 70 %
PLATELETS: 210 10*3/uL (ref 150–400)
RBC: 2.96 MIL/uL — ABNORMAL LOW (ref 3.87–5.11)
RDW: 13.8 % (ref 11.5–15.5)
WBC: 8.6 10*3/uL (ref 4.0–10.5)
nRBC: 0 % (ref 0.0–0.2)

## 2018-12-25 LAB — GLUCOSE, CAPILLARY
GLUCOSE-CAPILLARY: 124 mg/dL — AB (ref 70–99)
Glucose-Capillary: 134 mg/dL — ABNORMAL HIGH (ref 70–99)
Glucose-Capillary: 168 mg/dL — ABNORMAL HIGH (ref 70–99)
Glucose-Capillary: 134 mg/dL — ABNORMAL HIGH (ref 70–99)

## 2018-12-25 MED ORDER — DELFLEX-LC/2.5% DEXTROSE 394 MOSM/L IP SOLN
INTRAPERITONEAL | Status: DC
  Administered 2018-12-30: 5000 mL via INTRAPERITONEAL
  Administered 2018-12-31: 20:00:00 via INTRAPERITONEAL

## 2018-12-25 MED ORDER — GLYCERIN (LAXATIVE) 2.1 G RE SUPP
1.0000 | RECTAL | Status: DC | PRN
  Administered 2018-12-26 – 2018-12-29 (×2): 1 via RECTAL
  Filled 2018-12-25 (×4): qty 1

## 2018-12-25 NOTE — Evaluation (Signed)
Occupational Therapy Evaluation Patient Details Name: [name] MRN: [id number] DOB: Apr 23, 1966 Today's Date: 12/25/2018    History of Present Illness [name] is a 53 y.o. female with ESRD on PD presented to ED by EMS with complaints of 10/10 pain in bilateral legs and abdomen.  She feels like she might have a recurrence of cellulitis in her legs which she has had multiple times in the past. She reports that she had dialysis at home last night.  She reports that her legs have increased in size recently.  She has chronic lymphedema in both legs which is severe.  She reports abdominal pain in LLQ below her PD cath site.  She reports intermittent fever at home but no chills.  She denies nausea and vomiting.  She has been concerned about blood clots in legs as she is very immobile.  She tested positive for flu B and was placed on Tamiflu.  There was concern for peritonitis, patient underwent paracentesis which was negative for peritonitis   Clinical Impression   Pt admitted with above diagnoses, with fatigue and pain limiting ability to complete BADL at desired level of ind. Pt presenting with severe BLE brawny edema, that pt reports an increase in as of late. This edema limits functional mobility, PTA using power w/c at baseline. She shares that power w/c does not fit in BR, so she would need to be able to use her cane to enter BR at home. Attempted EOB activity, pt deferring due to nausea and fatigue. Participated in bed level activity, with practicing rolling and BUE/BLE exercise. Pt fatiguing after completing bed level exercise. Pt reports a history of many "near misses" at home, but never actually falling. Her husband is in a wheelchair, and live in son frequently travels due to job. 44 year old grand daughter occasionally assists. At this time anticipate pt will need SNF level of care to maximize safety in BADL participation, unless she progresses with her mobility- considering she will  need to transfer to/from w/c and walk to BR with cane.     Follow Up Recommendations  SNF;Home health OT(pending pt progress with mobility)    Equipment Recommendations  None recommended by OT    Recommendations for Other Services       Precautions / Restrictions Precautions Precautions: Fall Restrictions Weight Bearing Restrictions: No      Mobility Bed Mobility Overal bed mobility: Needs Assistance Bed Mobility: Rolling Rolling: Max assist         General bed mobility comments: able to pull self with arms, limited by pain in abdomen; significant assist for BLEs  Transfers                 General transfer comment: NT this date due to nausea and fatigue, pt deferring    Balance                                           ADL either performed or assessed with clinical judgement   ADL Overall ADL's : Needs assistance/impaired Eating/Feeding: Set up;Sitting   Grooming: Set up;Sitting;Bed level   Upper Body Bathing: Moderate assistance;Bed level   Lower Body Bathing: Total assistance;Bed level   Upper Body Dressing : Minimal assistance;Sitting   Lower Body Dressing: Total assistance;Sit to/from stand Lower Body Dressing Details (indicate cue type and reason): anticipate severe brawny edema in BLEs would  limit ability to don apnts/socks Toilet Transfer: BSC;Maximal assistance   Toileting- Clothing Manipulation and Hygiene: Maximal assistance;Sitting/lateral lean;Sit to/from stand   Tub/ Shower Transfer: Maximal assistance;Shower seat     General ADL Comments: pt shares she uses power wheelchair at baseline to complete functional mobility and has been for about 5 years. She uses 2-3 L [place] at baseline, 3L on date of eval. Pt deferring OOB or EOB activity due to nausea, fatigues quickly with bed level exercise.     Vision Baseline Vision/History: No visual deficits Patient Visual Report: No change from baseline Additional Comments:  mentions a little blurry     Perception     Praxis      Pertinent Vitals/Pain Pain Assessment: 0-10 Pain Score: 6  Pain Location: abdomen, legs Pain Descriptors / Indicators: Aching;Discomfort Pain Intervention(s): Limited activity within patient's tolerance;Monitored during session;Repositioned     Hand Dominance     Extremity/Trunk Assessment Upper Extremity Assessment Upper Extremity Assessment: Overall WFL for tasks assessed(grossly 5/5 bilaterally)   Lower Extremity Assessment Lower Extremity Assessment: Defer to PT evaluation RLE Deficits / Details: severe brawny edema LLE Deficits / Details: severe brawny edema with increased significance on L side       Communication Communication Communication: No difficulties   Cognition Arousal/Alertness: Awake/alert Behavior During Therapy: WFL for tasks assessed/performed Overall Cognitive Status: Within Functional Limits for tasks assessed                                     General Comments  extreme brawny edema in BLEs    Exercises     Shoulder Instructions      Home Living Family/patient expects to be discharged to:: Private residence Living Arrangements: Spouse/significant other;Children;Other relatives(grand daughter (12)) Available Help at Discharge: Available PRN/intermittently;Other (Comment)(husband uses w/c, son is truck driver and gone most of the time, grand daughter is 50) Type of Home: House Home Access: [place]: One level     Bathroom Shower/Tub: Teacher, early years/pre: Standard Bathroom Accessibility: No   Home Equipment: Wheelchair - power;Cane - single point;Walker - 2 wheels   Additional Comments: states she uses power w/c most frequently, but has to use cane to get into BR from w/c because w/c will not fit. States she rarely uses walker      Prior Functioning/Environment Level of Independence: Needs assistance  Gait / Transfers  Assistance Needed: reports uses power wheelchair for majority of time ADL's / Homemaking Assistance Needed: pt reports ind with dressing and bathing; needs assist for shopping and homemaking tasks            OT Problem List: Decreased strength;Impaired balance (sitting and/or standing);Cardiopulmonary status limiting activity;Obesity;Decreased activity tolerance;Decreased knowledge of use of DME or AE      OT Treatment/Interventions: Self-care/ADL training;DME and/or AE instruction;Therapeutic activities;Balance training;Therapeutic exercise;Energy conservation;Patient/family education    OT Goals(Current goals can be found in the care plan section) Acute Rehab OT Goals Patient Stated Goal: to beat this and get better OT Goal Formulation: With patient Time For Goal Achievement: 01/08/19 Potential to Achieve Goals: Good  OT Frequency: Min 2X/week   Barriers to D/C:            Co-evaluation              AM-PAC OT "6 Clicks" Daily Activity     Outcome Measure Help from another  person eating meals?: None Help from another person taking care of personal grooming?: None(seated) Help from another person toileting, which includes using toliet, bedpan, or urinal?: A Lot Help from another person bathing (including washing, rinsing, drying)?: A Lot Help from another person to put on and taking off regular upper body clothing?: None Help from another person to put on and taking off regular lower body clothing?: A Lot 6 Click Score: 18   End of Session    Activity Tolerance: Patient limited by fatigue Patient left: in bed;with call bell/phone within reach;with bed alarm set  OT Visit Diagnosis: Other abnormalities of gait and mobility (R26.89);Muscle weakness (generalized) (M62.81);History of falling (Z91.81)                Time: 6568-1275 OT Time Calculation (min): 17 min Charges:  OT General Charges $OT Visit: 1 Visit OT Evaluation $OT Eval Moderate Complexity: 1  Mod  Zenovia Jarred, MSOT, OTR/L Behavioral Health OT/ Acute Relief OT [facility] Office: [place] 12/25/2018, 4:05 PM

## 2018-12-25 NOTE — Progress Notes (Signed)
PROGRESS NOTE    [name]  JEH:[id number] DOB: [birth year] DOA: 12/21/2018 PCP: [name], MD     Brief Narrative:  [name] is a 53 y.o. female with ESRD on PD presented to ED by EMS with complaints of 10/10 pain in bilateral legs and abdomen.  She feels like she might have a recurrence of cellulitis in her legs which she has had multiple times in the past. She reports that she had dialysis at home last night.  She reports that her legs have increased in size recently.  She has chronic lymphedema in both legs which is severe.  She reports abdominal pain in LLQ below her PD cath site.  She reports intermittent fever at home but no chills.  She denies nausea and vomiting.  She has been concerned about blood clots in legs as she is very immobile.  She tested positive for flu B and was placed on Tamiflu.  There was concern for peritonitis, patient underwent paracentesis which was negative for peritonitis.  Nephrology was consulted.  New events last 24 hours / Subjective: Continues to have some soreness in the left lower quadrant abdominal wall, her thighs feel much better.  Complains of some urinary incontinence.  No bowel movement yesterday, requesting another suppository  Assessment & Plan:   Principal Problem:   Influenza B Active Problems:   Lymphedema of lower extremity   Diabetes mellitus (HCC)   Morbid obesity (HCC)   HTN (hypertension)   Chronic diastolic CHF (congestive heart failure) (HCC)   ESRD on dialysis (HCC)   ESRD (end stage renal disease) (HCC)   Cor pulmonale, chronic (HCC)   Hypokalemia   Peritonitis (HCC)   Abdominal pain, LLQ   Influenza B Tamiflu received 54m dose, which is full 5 day course for pt on PD. Discussed with pharmacist. Droplet precaution  Concern for mild nonpurulent cellulitis LLQ abdominal wall Augmentin started   Constipation Bowel regimen ordered, suppository prn   Bilateral leg pain DVT ultrasound was negative  ESRD  on peritoneal dialysis Nephrology consulted  Type 2 diabetes Levemir, sliding-scale insulin  Essential hypertension Continue Norvasc  Morbid obesity Body mass index is 58.7 kg/m.   Anxiety Continue Xanax   DVT prophylaxis: Subq hep Code Status: Full Family Communication:  No family at bedside Disposition Plan: Pending further improvement in abdominal wall cellulitis   Consultants:   Nephrology  Procedures:   PD   Antimicrobials:  Anti-infectives (From admission, onward)   Start     Dose/Rate Route Frequency Ordered Stop   12/23/18 1200  amoxicillin-clavulanate (AUGMENTIN) 500-125 MG per tablet 500 mg     1 tablet Oral Every 24 hours 12/23/18 1022     12/23/18 1015  amoxicillin-clavulanate (AUGMENTIN) 875-125 MG per tablet 1 tablet  Status:  Discontinued     1 tablet Oral Every 12 hours 12/23/18 1014 12/23/18 1021   12/22/18 1000  oseltamivir (TAMIFLU) capsule 30 mg  Status:  Discontinued     30 mg Oral Daily 12/21/18 1631 12/21/18 1738   12/21/18 1700  cefTAZidime (FORTAZ) 2 g in sodium chloride 0.9 % 100 mL IVPB  Status:  Discontinued     2 g 200 mL/hr over 30 Minutes Intravenous  Once 12/21/18 1655 12/21/18 2016   12/21/18 1700  ceFAZolin (ANCEF) IVPB 1 g/50 mL premix  Status:  Discontinued     1 g 100 mL/hr over 30 Minutes Intravenous  Once 12/21/18 1655 12/21/18 2016   12/21/18 1215  oseltamivir (TAMIFLU) capsule  30 mg     30 mg Oral  Once 12/21/18 1209 12/21/18 1307       Objective: Vitals:   12/24/18 1850 12/24/18 1921 12/25/18 0620 12/25/18 0804  BP: 113/60 129/62 121/66 126/65  Pulse: 78 76 74 79  Resp: (!) _0 Temp: 98.5 F (36.9 C) 97.9 F (36.6 C) 98 F (36.7 C) 98.3 F (36.8 C)  TempSrc: Oral Oral Oral Oral  SpO2: 97% 99% 99% 100%  Weight: (!) 161.9 kg  (!) 160 kg   Height:        Intake/Output Summary (Last 24 hours) at 12/25/2018 1218 Last data filed at 12/25/2018 1102 Gross per 24 hour  Intake 543 ml  Output -  Net 543 ml     Filed Weights   12/23/18 2159 12/24/18 1850 12/25/18 0620  Weight: (!) 160.8 kg (!) 161.9 kg (!) 160 kg     Examination: General exam: Appears calm and comfortable  Respiratory system: Clear to auscultation. Respiratory effort normal. Cardiovascular system: S1 & S2 heard, RRR. No JVD, murmurs, rubs, gallops or clicks. No pedal edema. Gastrointestinal system: Abdomen is nondistended, soft  Central nervous system: Alert and oriented. No focal neurological deficits. Extremities: Symmetric 5 x 5 power. Skin: +LLQ abdominal wall with TTP and induration, edema  Psychiatry: Judgement and insight appear normal. Mood & affect appropriate.    Data Reviewed: I have personally reviewed following labs and imaging studies  CBC: Recent Labs  Lab 12/21/18 1106 12/21/18 1652 12/22/18 0803 12/23/18 0651 12/24/18 0550 12/25/18 0728  WBC 10.8* 10.4 9.0 8.6 8.9 8.6  NEUTROABS 9.3*  --  6.4 6.0 6.1 6.1  HGB 10.2* 9.4* 9.7* 9.7* 9.4* 9.8*  HCT 33.5* 30.1* 29.1* 30.1* 28.8* 29.8*  MCV 102.8* 100.7* 99.0 102.0* 101.8* 100.7*  PLT 219 203 PLATELET CLUMPS NOTED ON SMEAR, UNABLE TO ESTIMATE 218 203 600   Basic Metabolic Panel: Recent Labs  Lab 12/21/18 1106 12/21/18 1134 12/21/18 1652 12/22/18 0803 12/23/18 0651 12/24/18 0550 12/25/18 0728  NA 132*  --   --  135 135 135 133*  K 2.3*  --   --  4.1 2.8* 3.1* 3.9  CL 91*  --   --  97* 94* 96* 97*  CO2 26  --   --  _1 GLUCOSE 200*  --   --  142* 136* 130* 146*  BUN 42*  --   --  42* 38* 40* 41*  CREATININE 9.72*  --  10.48* 10.28* 10.52* 10.98* 10.65*  CALCIUM 8.4*  --   --  8.9 8.6* 8.5* 9.0  MG  --  1.7  --  1.8  --   --   --   PHOS  --   --   --  6.4*  --   --   --    GFR: Estimated Creatinine Clearance: 9.6 mL/min (A) (by C-G formula based on SCr of 10.65 mg/dL (H)). Liver Function Tests: Recent Labs  Lab 12/21/18 1106 12/22/18 0803  AST 12*  --   ALT 21  --   ALKPHOS 113  --   BILITOT 0.4  --   PROT 7.6  --    ALBUMIN 3.2* 2.7*   Recent Labs  Lab 12/21/18 1106  LIPASE 66*   No results for input(s): AMMONIA in the last 168 hours. Coagulation Profile: No results for input(s): INR, PROTIME in the last 168 hours. Cardiac Enzymes: No results for input(s): CKTOTAL, CKMB, CKMBINDEX, TROPONINI  in the last 168 hours. BNP (last 3 results) No results for input(s): PROBNP in the last 8760 hours. HbA1C: No results for input(s): HGBA1C in the last 72 hours. CBG: Recent Labs  Lab 12/24/18 1118 12/24/18 1611 12/24/18 2132 12/25/18 0805 12/25/18 1148  GLUCAP 128* 111* 161* 134* 168*   Lipid Profile: No results for input(s): CHOL, HDL, LDLCALC, TRIG, CHOLHDL, LDLDIRECT in the last 72 hours. Thyroid Function Tests: No results for input(s): TSH, T4TOTAL, FREET4, T3FREE, THYROIDAB in the last 72 hours. Anemia Panel: No results for input(s): VITAMINB12, FOLATE, FERRITIN, TIBC, IRON, RETICCTPCT in the last 72 hours. Sepsis Labs: No results for input(s): PROCALCITON, LATICACIDVEN in the last 168 hours.  Recent Results (from the past 240 hour(s))  Culture, blood (routine x 2)     Status: None (Preliminary result)   Collection Time: 12/21/18 11:06 AM  Result Value Ref Range Status   Specimen Description BLOOD BLOOD RIGHT WRIST  Final   Special Requests   Final    BOTTLES DRAWN AEROBIC ONLY Blood Culture results may not be optimal due to an inadequate volume of blood received in culture bottles   Culture   Final    NO GROWTH 4 DAYS Performed at [facility], [address], [place], [place] [address]    Report Status PENDING  Incomplete  MRSA PCR Screening     Status: None   Collection Time: 12/21/18  6:14 PM  Result Value Ref Range Status   MRSA by PCR NEGATIVE NEGATIVE Final    Comment:        The GeneXpert MRSA Assay (FDA approved for NASAL specimens only), is one component of a comprehensive MRSA colonization surveillance program. It is not intended to diagnose MRSA infection nor to  guide or monitor treatment for MRSA infections. Performed at [facility], [place] [address], [place], [place] [address]   Culture, body fluid-bottle     Status: None (Preliminary result)   Collection Time: 12/21/18  8:18 PM  Result Value Ref Range Status   Specimen Description FLUID PERITONEAL DIALYSATE  Final   Special Requests BOTTLES DRAWN AEROBIC AND ANAEROBIC  Final   Culture NO GROWTH 3 DAYS  Final   Report Status PENDING  Incomplete  Gram stain     Status: None   Collection Time: 12/21/18  8:18 PM  Result Value Ref Range Status   Specimen Description FLUID PERITONEAL DIALYSATE  Final   Special Requests NONE  Final   Gram Stain   Final    WBC PRESENT,BOTH PMN AND MONONUCLEAR NO ORGANISMS SEEN CYTOSPIN SMEAR Performed at [facility], [address], [place], [place] [address]    Report Status 12/21/2018 FINAL  Final       Radiology Studies: No results found.    Scheduled Meds: . amLODipine  5 mg Oral Daily  . amoxicillin-clavulanate  1 tablet Oral Q24H  . gentamicin cream  1 application Topical Daily  . heparin  5,000 Units Subcutaneous Q8H  . insulin aspart  0-20 Units Subcutaneous TID WC  . insulin detemir  10 Units Subcutaneous Daily  . lanthanum  1,500 mg Oral TID WC  . multivitamin  1 tablet Oral Daily  . potassium chloride SA  40 mEq Oral Daily  . senna-docusate  1 tablet Oral BID  . sodium chloride flush  3 mL Intravenous Q12H   Continuous Infusions: . sodium chloride    . dialysis solution 2.5% low-MG/low-CA       LOS: 4  days    Time spent: 10mnutes   JDessa Phi DO [facility] [email or web address] Password [email or web address]1/05/2019, 12:18 PM

## 2018-12-25 NOTE — Progress Notes (Signed)
[facility]    NEPHROLOGY PROGRESS NOTE  SUBJECTIVE: Continues to feel better.  Worked with physical therapy for leg exercises today.  Rash on pannus and legs is improving with antibiotics.  PD last night without difficulty.  Denies cough or fever.  No n/v.  No headaches.   Otherwise no acute complaints.   OBJECTIVE:  Vitals:   12/25/18 0620 12/25/18 0804  BP: 121/66 126/65  Pulse: 74 79  Resp: 20 18  Temp: 98 F (36.7 C) 98.3 F (36.8 C)  SpO2: 99% 100%   I/O last 3 completed shifts: In: 1 [P.O.:720] Out: 0    Genearl:  AAOx3 NAD HEENT: MMM [place] AT anicteric sclera Neck:  No JVD, no adenopathy CV:  Heart RRR  Lungs:  L/S CTA bilaterally Abd:  abd SNT/ND with normal BS, obese, LLQ pd catheter GU:  Bladder non-palpable Extremities:  Bilateral LE lymphedema Skin:  (+)rash in pannus-improving  MEDICATIONS:   Current Facility-Administered Medications:  .  0.9 %  sodium chloride infusion, 250 mL, Intravenous, PRN, Johnson, Clanford L, MD .  acetaminophen (TYLENOL) tablet 650 mg, 650 mg, Oral, Q6H PRN, Johnson, Clanford L, MD .  ALPRAZolam Duanne Moron) tablet 0.5 mg, 0.5 mg, Oral, TID PRN, Johnson, Clanford L, MD .  amLODipine (NORVASC) tablet 5 mg, 5 mg, Oral, Daily, Johnson, Clanford L, MD, 5 mg at 12/25/18 1104 .  amoxicillin-clavulanate (AUGMENTIN) 500-125 MG per tablet 500 mg, 1 tablet, Oral, Q24H, Dessa Phi, DO, 500 mg at 12/25/18 1219 .  dialysis solution 2.5% low-MG/low-CA dianeal solution, , Intraperitoneal, Q24H, Eulanda Dorion A, DO .  gentamicin cream (GARAMYCIN) 0.1 % 1 application, 1 application, Topical, Daily, Abdikadir Fohl A, DO, 1 application at 48/34/75 1102 .  Glycerin (Adult) 2.1 g suppository 1 suppository, 1 suppository, Rectal, PRN, Dessa Phi, DO .  heparin 1,500 Units in dialysis solution 2.5% low-MG/low-CA 3,000 mL dialysis solution, , Peritoneal Dialysis, PRN, Dorean Daniello A, DO .  heparin injection 5,000 Units, 5,000  Units, Subcutaneous, Q8H, Johnson, Clanford L, MD, 5,000 Units at 12/25/18 1358 .  insulin aspart (novoLOG) injection 0-20 Units, 0-20 Units, Subcutaneous, TID WC, Johnson, Clanford L, MD, 4 Units at 12/25/18 1218 .  insulin detemir (LEVEMIR) injection 10 Units, 10 Units, Subcutaneous, Daily, Wynetta Emery, Clanford L, MD, 10 Units at 12/25/18 1103 .  lanthanum (FOSRENOL) chewable tablet 1,000 mg, 1,000 mg, Oral, BID PRN, Hammons, Theone Murdoch, RPH .  lanthanum (FOSRENOL) chewable tablet 1,500 mg, 1,500 mg, Oral, TID WC, Johnson, Clanford L, MD, 1,500 mg at 12/25/18 1220 .  multivitamin (RENA-VIT) tablet 1 tablet, 1 tablet, Oral, Daily, Johnson, Clanford L, MD, 1 tablet at 12/25/18 1103 .  oxyCODONE (Oxy IR/ROXICODONE) immediate release tablet 10 mg, 10 mg, Oral, QID PRN, Wynetta Emery, Clanford L, MD, 10 mg at 12/25/18 8307 .  polyethylene glycol (MIRALAX / GLYCOLAX) packet 17 g, 17 g, Oral, Daily PRN, Justin Mend, MD, 17 g at 12/24/18 1108 .  potassium chloride SA (K-DUR,KLOR-CON) CR tablet 40 mEq, 40 mEq, Oral, Daily, Johnson, Clanford L, MD, 40 mEq at 12/25/18 1104 .  promethazine (PHENERGAN) tablet 12.5 mg, 12.5 mg, Oral, Q6H PRN, Johnson, Clanford L, MD, 12.5 mg at 12/22/18 0640 .  senna-docusate (Senokot-S) tablet 1 tablet, 1 tablet, Oral, BID, Dessa Phi, DO, 1 tablet at 12/25/18 1104 .  sodium chloride flush (NS) 0.9 % injection 3 mL, 3 mL, Intravenous, Q12H, Johnson, Clanford L, MD, 3 mL at 12/25/18 1102 .  sodium chloride flush (NS) 0.9 % injection 3  mL, 3 mL, Intravenous, PRN, Wynetta Emery, Clanford L, MD     LABS:  CBC Latest Ref Rng & Units 12/25/2018 12/24/2018 12/23/2018  WBC 4.0 - 10.5 K/uL 8.6 8.9 8.6  Hemoglobin 12.0 - 15.0 g/dL 9.8(L) 9.4(L) 9.7(L)  Hematocrit 36.0 - 46.0 % 29.8(L) 28.8(L) 30.1(L)  Platelets 150 - 400 K/uL 210 203 218    CMP Latest Ref Rng & Units 12/25/2018 12/24/2018 12/23/2018  Glucose 70 - 99 mg/dL 146(H) 130(H) 136(H)  BUN 6 - 20 mg/dL 41(H) 40(H) 38(H)  Creatinine  0.44 - 1.00 mg/dL 10.65(H) 10.98(H) 10.52(H)  Sodium 135 - 145 mmol/L 133(L) 135 135  Potassium 3.5 - 5.1 mmol/L 3.9 3.1(L) 2.8(L)  Chloride 98 - 111 mmol/L 97(L) 96(L) 94(L)  CO2 22 - 32 mmol/L _0 Calcium 8.9 - 10.3 mg/dL 9.0 8.5(L) 8.6(L)  Total Protein 6.5 - 8.1 g/dL - - -  Total Bilirubin 0.3 - 1.2 mg/dL - - -  Alkaline Phos 38 - 126 U/L - - -  AST 15 - 41 U/L - - -  ALT 0 - 44 U/L - - -    Lab Results  Component Value Date   PTH 127 (H) 11/29/2015   CALCIUM 9.0 12/25/2018   CAION 1.06 (L) 07/26/2017   PHOS 6.4 (H) 12/22/2018       Component Value Date/Time   COLORURINE YELLOW 08/11/2016 1800   APPEARANCEUR HAZY (A) 08/11/2016 1800   LABSPEC 1.020 08/11/2016 1800   PHURINE 5.5 08/11/2016 1800   GLUCOSEU NEGATIVE 08/11/2016 1800   HGBUR MODERATE (A) 08/11/2016 1800   BILIRUBINUR NEGATIVE 08/11/2016 1800   KETONESUR NEGATIVE 08/11/2016 1800   PROTEINUR 100 (A) 08/11/2016 1800   UROBILINOGEN 0.2 12/02/2014 1925   NITRITE NEGATIVE 08/11/2016 1800   LEUKOCYTESUR NEGATIVE 08/11/2016 1800      Component Value Date/Time   PHART 7.290 (L) 01/24/2017 1140   PCO2ART 41.0 01/24/2017 1140   PO2ART 85 01/24/2017 1140   HCO3 29.2 (H) 08/04/2017 0751   TCO2 31 08/04/2017 0751   ACIDBASEDEF 6.3 (H) 01/24/2017 1140   O2SAT 71.0 08/04/2017 0751       Component Value Date/Time   IRON 40 11/29/2015 0410   TIBC 277 11/29/2015 0410   FERRITIN 2214 (H) 11/13/2011 0500   IRONPCTSAT 14 11/29/2015 0410       ASSESSMENT/PLAN:     Problem List Items Addressed This Visit      Respiratory   * (Principal) Influenza B - Primary   Relevant Medications   oseltamivir (TAMIFLU) capsule 30 mg (Completed)   gentamicin cream (GARAMYCIN) 0.1 % 1 application     Other   Hypokalemia   Peritonitis (HCC)    Other Visit Diagnoses    Bilateral leg pain       Bilateral leg edema         53 year old female patient with a history of end-stage renal disease on peritoneal dialysis  presents with influenza B.   1.  ESRD on PD.  Continue current PD Rx.   Volume status stable.  Will add oral potassium.  2.  Anemia.  Likely due to CKD.  Aranesp x1 on 12/22/2017. Is iron replete 3.  Secondary hyperpara.  Continue lanthanum for phosphate control 4.  Influenza B.  Improving.   5.  Hypokalemia.  Likely secondary to peritoneal dialysis.  Continue oral potassium   [employer], DO, FACP

## 2018-12-26 LAB — BASIC METABOLIC PANEL
Anion gap: 14 (ref 5–15)
BUN: 42 mg/dL — ABNORMAL HIGH (ref 6–20)
CO2: 25 mmol/L (ref 22–32)
Calcium: 8.8 mg/dL — ABNORMAL LOW (ref 8.9–10.3)
Chloride: 97 mmol/L — ABNORMAL LOW (ref 98–111)
Creatinine, Ser: 11.04 mg/dL — ABNORMAL HIGH (ref 0.44–1.00)
GFR calc Af Amer: 4 mL/min — ABNORMAL LOW (ref >60)
GFR, EST NON AFRICAN AMERICAN: 4 mL/min — AB (ref >60)
GLUCOSE: 155 mg/dL — AB (ref 70–99)
Potassium: 3.2 mmol/L — ABNORMAL LOW (ref 3.5–5.1)
Sodium: 136 mmol/L (ref 135–145)

## 2018-12-26 LAB — CULTURE, BODY FLUID W GRAM STAIN -BOTTLE: Culture: NO GROWTH

## 2018-12-26 LAB — CULTURE, BLOOD (ROUTINE X 2): Culture: NO GROWTH

## 2018-12-26 LAB — GLUCOSE, CAPILLARY
Glucose-Capillary: 138 mg/dL — ABNORMAL HIGH (ref 70–99)
Glucose-Capillary: 111 mg/dL — ABNORMAL HIGH (ref 70–99)
Glucose-Capillary: 151 mg/dL — ABNORMAL HIGH (ref 70–99)
Glucose-Capillary: 272 mg/dL — ABNORMAL HIGH (ref 70–99)

## 2018-12-26 MED ORDER — BARIUM SULFATE 2.1 % PO SUSP
ORAL | Status: AC
  Administered 2018-12-27: 01:00:00
  Filled 2018-12-26: qty 2

## 2018-12-26 MED ORDER — PREDNISONE 50 MG PO TABS
50.0000 mg | ORAL_TABLET | Freq: Four times a day (QID) | ORAL | Status: AC
  Administered 2018-12-26 (×3): 50 mg via ORAL
  Filled 2018-12-26 (×3): qty 1

## 2018-12-26 MED ORDER — DIPHENHYDRAMINE HCL 25 MG PO CAPS
50.0000 mg | ORAL_CAPSULE | Freq: Once | ORAL | Status: DC
  Filled 2018-12-26: qty 2

## 2018-12-26 MED ORDER — BISACODYL 5 MG PO TBEC
10.0000 mg | DELAYED_RELEASE_TABLET | Freq: Once | ORAL | Status: AC
  Administered 2018-12-26: 10 mg via ORAL
  Filled 2018-12-26: qty 2

## 2018-12-26 MED ORDER — DIPHENHYDRAMINE HCL 50 MG/ML IJ SOLN: 50.0000 mg | Freq: Once | INTRAMUSCULAR | Status: DC

## 2018-12-26 MED ORDER — DIPHENHYDRAMINE HCL 25 MG PO CAPS: 50.0000 mg | ORAL_CAPSULE | Freq: Once | ORAL | Status: DC

## 2018-12-26 MED ORDER — DELFLEX-LC/2.5% DEXTROSE 394 MOSM/L IP SOLN: INTRAPERITONEAL | Status: DC

## 2018-12-26 MED ORDER — POTASSIUM CHLORIDE CRYS ER 20 MEQ PO TBCR
40.0000 meq | EXTENDED_RELEASE_TABLET | Freq: Once | ORAL | Status: AC
  Administered 2018-12-26: 40 meq via ORAL
  Filled 2018-12-26: qty 2

## 2018-12-26 NOTE — Progress Notes (Signed)
PT Cancellation Note  Patient Details Name: [name] MRN: [id number] DOB: 18-Nov-1966   Cancelled Treatment:    Reason Eval/Treat Not Completed: Patient at procedure or test/unavailable.  Pt getting peritoneal dialysis and door is being blocked for sterile field.  RN made aware and PT will check back tomorrow.  Thanks,  [name]. Abriel Geesey, PT, DPT  Acute Rehabilitation [phone] pager [phone] office     [name] 12/26/2018, 4:52 PM

## 2018-12-26 NOTE — Progress Notes (Signed)
  [facility] Progress Note    Assessment/ Plan:   53 year old female patient with a history of end-stage renal disease ([place])  on peritoneal dialysis presents with influenza B.   1.  ESRD on PD.  Continue current PD Rx.   Volume status stable.    Seen on PD 2.5% total UF 1442m - Wiill continue current regimen.  2.  Anemia.  Likely due to CKD.  Aranesp x1 on 12/22/2017. Is iron replete 3.  Secondary hyperpara.  Continue lanthanum for phosphate control 4.  Influenza B.  Improving.   5.  Hypokalemia.  Likely secondary to peritoneal dialysis.   - Added oral potassium 43m daily -> will give an extra dose today  Subjective:   C/o lower abd + LE pain Cough NP, chills overnight   Objective:   BP 116/69 (BP Location: Right Arm)   Pulse 74   Temp 98 F (36.7 C) (Oral)   Resp 18   Ht 5' 5" (1.651 m)   Wt (!) 161.4 kg   SpO2 99%   BMI 59.21 kg/m   Intake/Output Summary (Last 24 hours) at 12/26/2018 0840 Last data filed at 12/26/2018 0600 Gross per 24 hour  Intake 543 ml  Output 0 ml  Net 543 ml   Weight change: 0.3 kg  Physical Exam: Genearl:  AAOx3 NAD HEENT: MMM  AT anicteric sclera CV:  Heart RRR  Lungs:  L/S CTA bilaterally Abd:  abd SNT/ND with normal BS, obese, LLQ pd catheter Extremities:  Bilateral LE lymphedema  Imaging: No results found.  Labs: BMET Recent Labs  Lab 12/21/18 1106 12/21/18 1652 12/22/18 0803 12/23/18 0602711/05/20 0550 12/25/18 0728 12/26/18 0717  NA 132*  --  135 135 135 133* 136  K 2.3*  --  4.1 2.8* 3.1* 3.9 3.2*  CL 91*  --  97* 94* 96* 97* 97*  CO2 26  --  _0 GLUCOSE 200*  --  142* 136* 130* 146* 155*  BUN 42*  --  42* 38* 40* 41* 42*  CREATININE 9.72* 10.48* 10.28* 10.52* 10.98* 10.65* 11.04*  CALCIUM 8.4*  --  8.9 8.6* 8.5* 9.0 8.8*  PHOS  --   --  6.4*  --   --   --   --    CBC Recent Labs  Lab 12/22/18 0803 12/23/18 0651 12/24/18 0550 12/25/18 0728  WBC 9.0 8.6 8.9 8.6   NEUTROABS 6.4 6.0 6.1 6.1  HGB 9.7* 9.7* 9.4* 9.8*  HCT 29.1* 30.1* 28.8* 29.8*  MCV 99.0 102.0* 101.8* 100.7*  PLT PLATELET CLUMPS NOTED ON SMEAR, UNABLE TO ESTIMATE 218 203 210    Medications:    . amLODipine  5 mg Oral Daily  . amoxicillin-clavulanate  1 tablet Oral Q24H  . gentamicin cream  1 application Topical Daily  . heparin  5,000 Units Subcutaneous Q8H  . insulin aspart  0-20 Units Subcutaneous TID WC  . insulin detemir  10 Units Subcutaneous Daily  . lanthanum  1,500 mg Oral TID WC  . multivitamin  1 tablet Oral Daily  . potassium chloride SA  40 mEq Oral Daily  . senna-docusate  1 tablet Oral BID  . sodium chloride flush  3 mL Intravenous Q12H      JaOtelia SanteeMD 12/26/2018, 8:40 AM

## 2018-12-26 NOTE — Care Management Note (Signed)
Case Management Note Manya Silvas, RN MSN CCM Transitions of Care [address] [place] [phone]  Patient Details  Name: [name] MRN: [id number] Date of Birth: 1966/04/14  Subjective/Objective:                 Influenza B   Action/Plan: PTA home with husband. Sister provides transportation to all appointments. Has all DME needs. Noted OT recommendations for snf vs HHOT, PT pending. Not interested in snf - pt is not eligible for snf d/t PD. Pt has used [facility]. Choice list provided. Will f/u referral as patient nears time to transition home. Will continue to follow for transition of care needs.   Expected Discharge Date:                  Expected Discharge Plan:  [place]  In-House Referral:  NA  Discharge planning Services  CM Consult  Post Acute Care Choice:  NA Choice offered to:  Patient  DME Arranged:  N/A DME Agency:  NA  HH Arranged:    [place] Agency:  [place]  Status of Service:  In process, will continue to follow  If discussed at Long Length of Stay Meetings, dates discussed:    Additional Comments:  Bartholomew Crews, RN 12/26/2018, 2:58 PM

## 2018-12-26 NOTE — Progress Notes (Signed)
Received call from CT,per staff,CT with contrast cannot be done because contrast allergy is anaphylaxis. [name], Wonda Cheng, Therapist, sports

## 2018-12-26 NOTE — Progress Notes (Signed)
Called CT regarding change in order for CT. Instructed by staff not to give premedication that was scheduled for 0100. Patient will have oral contrast instead of IV. Patient made aware of change. [name], Wonda Cheng, Therapist, sports

## 2018-12-26 NOTE — Progress Notes (Addendum)
PROGRESS NOTE    [name]  EXB:[id number] DOB: 1966/03/10 DOA: 12/21/2018 PCP: [name], MD     Brief Narrative:  [name] is a 53 y.o. female with ESRD on PD presented to ED by EMS with complaints of 10/10 pain in bilateral legs and abdomen.  She feels like she might have a recurrence of cellulitis in her legs which she has had multiple times in the past. She reports that she had dialysis at home last night.  She reports that her legs have increased in size recently.  She has chronic lymphedema in both legs which is severe.  She reports abdominal pain in LLQ below her PD cath site.  She reports intermittent fever at home but no chills.  She denies nausea and vomiting.  She has been concerned about blood clots in legs as she is very immobile.  She tested positive for flu B and was placed on Tamiflu.  There was concern for peritonitis, patient underwent paracentesis which was negative for peritonitis.  Nephrology was consulted.  She was started on Augmentin for left lower quadrant abdominal wall cellulitis.  New events last 24 hours / Subjective: Not feeling well overall.  Complains of nausea.  Continues to have left lower quadrant abdominal wall pain.  Assessment & Plan:   Principal Problem:   Influenza B Active Problems:   Lymphedema of lower extremity   Diabetes mellitus (HCC)   Morbid obesity (HCC)   HTN (hypertension)   Chronic diastolic CHF (congestive heart failure) (HCC)   ESRD on dialysis (HCC)   ESRD (end stage renal disease) (HCC)   Cor pulmonale, chronic (HCC)   Hypokalemia   Peritonitis (HCC)   Abdominal pain, LLQ   Influenza B Tamiflu received 40m dose, which is full 5 day course for pt on PD. Discussed with pharmacist. Droplet precaution  Nonpurulent cellulitis LLQ abdominal wall Augmentin started  Without any clinical improvement over the past couple of days.  Remains extremely tender to palpation.  Obtain CT abdomen to rule out any underlying  abscess  Constipation Bowel regimen ordered, suppository prn   Bilateral leg pain DVT ultrasound was negative  ESRD on peritoneal dialysis Nephrology consulted  Type 2 diabetes Levemir, sliding-scale insulin  Essential hypertension Continue Norvasc  Morbid obesity Body mass index is 59.21 kg/m.   Anxiety Continue Xanax   DVT prophylaxis: Subq hep Code Status: Full Family Communication:  No family at bedside Disposition Plan: Pending further improvement in abdominal wall cellulitis    Consultants:   Nephrology  Procedures:   PD   Antimicrobials:  Anti-infectives (From admission, onward)   Start     Dose/Rate Route Frequency Ordered Stop   12/23/18 1200  amoxicillin-clavulanate (AUGMENTIN) 500-125 MG per tablet 500 mg     1 tablet Oral Every 24 hours 12/23/18 1022     12/23/18 1015  amoxicillin-clavulanate (AUGMENTIN) 875-125 MG per tablet 1 tablet  Status:  Discontinued     1 tablet Oral Every 12 hours 12/23/18 1014 12/23/18 1021   12/22/18 1000  oseltamivir (TAMIFLU) capsule 30 mg  Status:  Discontinued     30 mg Oral Daily 12/21/18 1631 12/21/18 1738   12/21/18 1700  cefTAZidime (FORTAZ) 2 g in sodium chloride 0.9 % 100 mL IVPB  Status:  Discontinued     2 g 200 mL/hr over 30 Minutes Intravenous  Once 12/21/18 1655 12/21/18 2016   12/21/18 1700  ceFAZolin (ANCEF) IVPB 1 g/50 mL premix  Status:  Discontinued  1 g 100 mL/hr over 30 Minutes Intravenous  Once 12/21/18 1655 12/21/18 2016   12/21/18 1215  oseltamivir (TAMIFLU) capsule 30 mg     30 mg Oral  Once 12/21/18 1209 12/21/18 1307       Objective: Vitals:   12/25/18 2102 12/26/18 0236 12/26/18 0452 12/26/18 0753  BP:  119/66  116/69  Pulse:  73  74  Resp:  18  18  Temp:  98.2 F (36.8 C)  98 F (36.7 C)  TempSrc:  Oral  Oral  SpO2:  98%  99%  Weight: (!) 163.7 kg  (!) 161.4 kg   Height:        Intake/Output Summary (Last 24 hours) at 12/26/2018 1407 Last data filed at 12/26/2018  1307 Gross per 24 hour  Intake 14166 ml  Output 14940 ml  Net -774 ml   Filed Weights   12/25/18 1710 12/25/18 2102 12/26/18 0452  Weight: (!) 162.2 kg (!) 163.7 kg (!) 161.4 kg    Examination: General exam: Appears calm and comfortable  Respiratory system: Clear to auscultation. Respiratory effort normal. Cardiovascular system: S1 & S2 heard, RRR. No JVD, murmurs, rubs, gallops or clicks. No pedal edema. Gastrointestinal system: Abdomen is nondistended, soft. No organomegaly or masses felt. Normal bowel sounds heard. Central nervous system: Alert and oriented. No focal neurological deficits. Extremities: Symmetric 5 x 5 power. Skin: +LLQ abdominal wall with induration, edema, warmth, and TTP  Psychiatry: Judgement and insight appear normal. Mood & affect appropriate.    Data Reviewed: I have personally reviewed following labs and imaging studies  CBC: Recent Labs  Lab 12/21/18 1106 12/21/18 1652 12/22/18 0803 12/23/18 0651 12/24/18 0550 12/25/18 0728  WBC 10.8* 10.4 9.0 8.6 8.9 8.6  NEUTROABS 9.3*  --  6.4 6.0 6.1 6.1  HGB 10.2* 9.4* 9.7* 9.7* 9.4* 9.8*  HCT 33.5* 30.1* 29.1* 30.1* 28.8* 29.8*  MCV 102.8* 100.7* 99.0 102.0* 101.8* 100.7*  PLT 219 203 PLATELET CLUMPS NOTED ON SMEAR, UNABLE TO ESTIMATE 218 203 986   Basic Metabolic Panel: Recent Labs  Lab 12/21/18 1134  12/22/18 0803 12/23/18 0651 12/24/18 0550 12/25/18 0728 12/26/18 0717  NA  --   --  135 135 135 133* 136  K  --   --  4.1 2.8* 3.1* 3.9 3.2*  CL  --   --  97* 94* 96* 97* 97*  CO2  --   --  _0 GLUCOSE  --   --  142* 136* 130* 146* 155*  BUN  --   --  42* 38* 40* 41* 42*  CREATININE  --    < > 10.28* 10.52* 10.98* 10.65* 11.04*  CALCIUM  --   --  8.9 8.6* 8.5* 9.0 8.8*  MG 1.7  --  1.8  --   --   --   --   PHOS  --   --  6.4*  --   --   --   --    < > = values in this interval not displayed.   GFR: Estimated Creatinine Clearance: 9.3 mL/min (A) (by C-G formula based on SCr of  11.04 mg/dL (H)). Liver Function Tests: Recent Labs  Lab 12/21/18 1106 12/22/18 0803  AST 12*  --   ALT 21  --   ALKPHOS 113  --   BILITOT 0.4  --   PROT 7.6  --   ALBUMIN 3.2* 2.7*   Recent Labs  Lab 12/21/18 1106  LIPASE  66*   No results for input(s): AMMONIA in the last 168 hours. Coagulation Profile: No results for input(s): INR, PROTIME in the last 168 hours. Cardiac Enzymes: No results for input(s): CKTOTAL, CKMB, CKMBINDEX, TROPONINI in the last 168 hours. BNP (last 3 results) No results for input(s): PROBNP in the last 8760 hours. HbA1C: No results for input(s): HGBA1C in the last 72 hours. CBG: Recent Labs  Lab 12/25/18 1148 12/25/18 1650 12/25/18 2118 12/26/18 0754 12/26/18 1155  GLUCAP 168* 124* 134* 138* 111*   Lipid Profile: No results for input(s): CHOL, HDL, LDLCALC, TRIG, CHOLHDL, LDLDIRECT in the last 72 hours. Thyroid Function Tests: No results for input(s): TSH, T4TOTAL, FREET4, T3FREE, THYROIDAB in the last 72 hours. Anemia Panel: No results for input(s): VITAMINB12, FOLATE, FERRITIN, TIBC, IRON, RETICCTPCT in the last 72 hours. Sepsis Labs: No results for input(s): PROCALCITON, LATICACIDVEN in the last 168 hours.  Recent Results (from the past 240 hour(s))  Culture, blood (routine x 2)     Status: None   Collection Time: 12/21/18 11:06 AM  Result Value Ref Range Status   Specimen Description BLOOD BLOOD RIGHT WRIST  Final   Special Requests   Final    BOTTLES DRAWN AEROBIC ONLY Blood Culture results may not be optimal due to an inadequate volume of blood received in culture bottles   Culture   Final    NO GROWTH 5 DAYS Performed at [facility], [address], [place], [place] [address]    Report Status 12/26/2018 FINAL  Final  MRSA PCR Screening     Status: None   Collection Time: 12/21/18  6:14 PM  Result Value Ref Range Status   MRSA by PCR NEGATIVE NEGATIVE Final    Comment:        The GeneXpert MRSA Assay (FDA approved for  NASAL specimens only), is one component of a comprehensive MRSA colonization surveillance program. It is not intended to diagnose MRSA infection nor to guide or monitor treatment for MRSA infections. Performed at [facility], [place] [address], [place], [place] [address]   Culture, body fluid-bottle     Status: None   Collection Time: 12/21/18  8:18 PM  Result Value Ref Range Status   Specimen Description FLUID PERITONEAL DIALYSATE  Final   Special Requests BOTTLES DRAWN AEROBIC AND ANAEROBIC  Final   Culture   Final    NO GROWTH 5 DAYS Performed at [facility], [address], [place], [place] [address]    Report Status 12/26/2018 FINAL  Final  Gram stain     Status: None   Collection Time: 12/21/18  8:18 PM  Result Value Ref Range Status   Specimen Description FLUID PERITONEAL DIALYSATE  Final   Special Requests NONE  Final   Gram Stain   Final    WBC PRESENT,BOTH PMN AND MONONUCLEAR NO ORGANISMS SEEN CYTOSPIN SMEAR Performed at [facility], [address], [place], [place] [address]    Report Status 12/21/2018 FINAL  Final       Radiology Studies: No results found.    Scheduled Meds: . amLODipine  5 mg Oral Daily  . amoxicillin-clavulanate  1 tablet Oral Q24H  . diphenhydrAMINE  50 mg Oral Once   Or  . diphenhydrAMINE  50 mg Intravenous Once  . gentamicin cream  1 application Topical Daily  . heparin  5,000 Units Subcutaneous Q8H  . insulin aspart  0-20 Units Subcutaneous TID WC  . insulin detemir  10  Units Subcutaneous Daily  . lanthanum  1,500 mg Oral TID WC  . multivitamin  1 tablet Oral Daily  . potassium chloride SA  40 mEq Oral Daily  . predniSONE  50 mg Oral Q6H  . senna-docusate  1 tablet Oral BID  . sodium chloride flush  3 mL Intravenous Q12H   Continuous Infusions: . sodium chloride    . dialysis solution 2.5% low-MG/low-CA    . dialysis solution 2.5% low-MG/low-CA       LOS: 5 days    Time spent: 31mnutes    JDessa Phi DO [facility] [email or web address] Password [email or web address] 12/26/2018, 2:07 PM

## 2018-12-27 ENCOUNTER — Inpatient Hospital Stay (HOSPITAL_COMMUNITY): Payer: Medicare Other

## 2018-12-27 DIAGNOSIS — L0390 Cellulitis, unspecified: Secondary | ICD-10-CM

## 2018-12-27 LAB — CBC
HCT: 30 % — ABNORMAL LOW (ref 36.0–46.0)
HEMOGLOBIN: 9.5 g/dL — AB (ref 12.0–15.0)
MCH: 31.6 pg (ref 26.0–34.0)
MCHC: 31.7 g/dL (ref 30.0–36.0)
MCV: 99.7 fL (ref 80.0–100.0)
NRBC: 0 % (ref 0.0–0.2)
Platelets: 223 10*3/uL (ref 150–400)
RBC: 3.01 MIL/uL — ABNORMAL LOW (ref 3.87–5.11)
RDW: 13 % (ref 11.5–15.5)
WBC: 11 10*3/uL — ABNORMAL HIGH (ref 4.0–10.5)

## 2018-12-27 LAB — COMPREHENSIVE METABOLIC PANEL
ALT: 15 U/L (ref 0–44)
AST: 13 U/L — ABNORMAL LOW (ref 15–41)
Albumin: 2.4 g/dL — ABNORMAL LOW (ref 3.5–5.0)
Alkaline Phosphatase: 91 U/L (ref 38–126)
Anion gap: 12 (ref 5–15)
BUN: 45 mg/dL — ABNORMAL HIGH (ref 6–20)
CO2: 25 mmol/L (ref 22–32)
Calcium: 8.7 mg/dL — ABNORMAL LOW (ref 8.9–10.3)
Chloride: 95 mmol/L — ABNORMAL LOW (ref 98–111)
Creatinine, Ser: 10.97 mg/dL — ABNORMAL HIGH (ref 0.44–1.00)
GFR calc non Af Amer: 4 mL/min — ABNORMAL LOW (ref >60)
GFR, EST AFRICAN AMERICAN: 4 mL/min — AB (ref >60)
Glucose, Bld: 239 mg/dL — ABNORMAL HIGH (ref 70–99)
POTASSIUM: 4.7 mmol/L (ref 3.5–5.1)
Sodium: 132 mmol/L — ABNORMAL LOW (ref 135–145)
Total Bilirubin: 0.7 mg/dL (ref 0.3–1.2)
Total Protein: 6.6 g/dL (ref 6.5–8.1)

## 2018-12-27 LAB — FERRITIN: Ferritin: 1085 ng/mL — ABNORMAL HIGH (ref 11–307)

## 2018-12-27 LAB — GLUCOSE, CAPILLARY
Glucose-Capillary: 246 mg/dL — ABNORMAL HIGH (ref 70–99)
Glucose-Capillary: 276 mg/dL — ABNORMAL HIGH (ref 70–99)
Glucose-Capillary: 209 mg/dL — ABNORMAL HIGH (ref 70–99)
Glucose-Capillary: 252 mg/dL — ABNORMAL HIGH (ref 70–99)

## 2018-12-27 LAB — IRON AND TIBC
IRON: 35 ug/dL (ref 28–170)
Saturation Ratios: 20 % (ref 10.4–31.8)
TIBC: 174 ug/dL — ABNORMAL LOW (ref 250–450)
UIBC: 139 ug/dL

## 2018-12-27 MED ORDER — CEPHALEXIN 500 MG PO CAPS
500.0000 mg | ORAL_CAPSULE | Freq: Two times a day (BID) | ORAL | Status: DC
  Administered 2018-12-27 – 2019-01-04 (×16): 500 mg via ORAL
  Filled 2018-12-27 (×16): qty 1

## 2018-12-27 NOTE — Progress Notes (Signed)
PT Cancellation Note  Patient Details Name: AMBERLEE GARVEY MRN: [id number] DOB: 1966-11-22   Cancelled Treatment:    Reason Eval/Treat Not Completed: Patient at procedure or test/unavailable on continuous cycle peritoneal dialysis; will check back another day and begin therapy when off of continuous dialysis cycle.   Deniece Ree PT, DPT, CBIS  Supplemental Physical Therapist [facility]    Pager [phone] Acute Rehab Office [phone]

## 2018-12-27 NOTE — Procedures (Signed)
Patient scheduled for CT of abd 3 am.  Requested by 5 MW staff to disconnect patient to transport to CT.  Stat drain completed, patient disconnected and trmt currently on hold.  She has 1 remaining dwell.  Dialysis staff will be in @ 6 am to resume trmt.

## 2018-12-27 NOTE — Progress Notes (Signed)
[facility] Progress Note    Assessment/ Plan:   53 year old female patient with a history of end-stage renal disease ([name])  on peritoneal dialysis presents with influenza B.   1. ESRD on PD. Continue current PD Rx. Volume status stable.   Seen on PD 2.5% total UF 1416m/1386 over past 48hrs - Wiill continue current regimen.  - Panniculitis with focal collection along catheter tunnel tract. Will have IR eval CT and determine whether a FNA is possible to r/o infectious source.   2. Anemia. Likely due to CKD. Aranesp x1 on 12/22/2017. Is iron replete 3. Secondary hyperpara. Continue lanthanum for phosphate control 4. Influenza B. Improving.  5. Hypokalemia. Likely secondary to peritoneal dialysis.  - Added oral potassium 498m daily -> will give an extra dose today  Subjective:   C/o lower abd esp on right side  + LE pain Cough NP, chills but no fever  Overall feels better today and able to eat.   Objective:   BP 120/68 (BP Location: Right Arm)   Pulse 70   Temp 98.8 F (37.1 C) (Oral)   Resp 18   Ht _0  (1.651 m)   Wt (!) 161.3 kg   SpO2 98%   BMI 59.18 kg/m   Intake/Output Summary (Last 24 hours) at 12/27/2018 [id number] data filed at 12/27/2018 0600 Gross per 24 hour  Intake 14226 ml  Output 14940 ml  Net -714 ml   Weight change: -0.9 kg  Physical Exam: General: A&Ox3 NAD HEENT: MMM [place] AT anicteric sclera CV: Heart RRR  Lungs: L/S CTA bilaterally Abd: abd SNT/ND with normal BS, obese, LLQ pd catheter, panniculitis and tender esp LLQ Extremities: Bilateral LE lymphedema  Imaging: Ct Abdomen Pelvis Wo Contrast  Result Date: 12/27/2018 CLINICAL DATA:  Worsening abdominal wall cellulitis. EXAM: CT ABDOMEN AND PELVIS WITHOUT CONTRAST TECHNIQUE: Multidetector CT imaging of the abdomen and pelvis was performed following the standard protocol without IV contrast. COMPARISON:  07/14/17 FINDINGS: Lower chest:  Mild atelectasis.  Hepatobiliary: No focal liver abnormality.No evidence of biliary obstruction or stone. Pancreas: Unremarkable. Spleen: Unremarkable. Adrenals/Urinary Tract: Negative adrenals. No hydronephrosis or stone. Unremarkable bladder. Stomach/Bowel:  No obstruction. No appendicitis. Vascular/Lymphatic: No acute vascular abnormality. No mass or adenopathy. Reproductive:Hysterectomy Other: Left eccentric abdominal wall skin thickening and subcutaneous reticulation. The wall can not be completely visualized due to patient size. There is a 2.4 cm low-density collection along the patient's left-sided Tenckhoff catheter, just superficial to the muscular wall. No soft tissue emphysema. Trace perihepatic ascites. Musculoskeletal: No acute abnormalities. IMPRESSION: 1. Known left-sided cellulitis/panniculitis. The abdominal wall could not be completely visualized due to patient body habitus. In the region of inflammation is a 2.4 cm deep subcutaneous collection along the Tenckhoff catheter. No soft tissue emphysema. 2. No acute intra-abdominal finding. Electronically Signed   By: JoMonte Fantasia.D.   On: 12/27/2018 05:00    Labs: BMET Recent Labs  Lab 12/21/18 1106 12/21/18 1652 12/22/18 0803 12/23/18 [id number]/20 0550 12/25/18 0728 12/26/18 0717  NA 132*  --  135 135 135 133* 136  K 2.3*  --  4.1 2.8* 3.1* 3.9 3.2*  CL 91*  --  97* 94* 96* 97* 97*  CO2 26  --  _1 GLUCOSE 200*  --  142* 136* 130* 146* 155*  BUN 42*  --  42* 38* 40* 41* 42*  CREATININE 9.72* 10.48* 10.28* 10.52* 10.98* 10.65* 11.04*  CALCIUM 8.4*  --  8.9 8.6*  8.5* 9.0 8.8*  PHOS  --   --  6.4*  --   --   --   --    CBC Recent Labs  Lab 12/22/18 0803 12/23/18 0651 12/24/18 0550 12/25/18 0728 12/27/18 0710  WBC 9.0 8.6 8.9 8.6 11.0*  NEUTROABS 6.4 6.0 6.1 6.1  --   HGB 9.7* 9.7* 9.4* 9.8* 9.5*  HCT 29.1* 30.1* 28.8* 29.8* 30.0*  MCV 99.0 102.0* 101.8* 100.7* 99.7  PLT PLATELET CLUMPS NOTED ON SMEAR, UNABLE TO  ESTIMATE 218 203 210 223    Medications:    . amLODipine  5 mg Oral Daily  . amoxicillin-clavulanate  1 tablet Oral Q24H  . diphenhydrAMINE  50 mg Oral Once   Or  . diphenhydrAMINE  50 mg Intravenous Once  . diphenhydrAMINE  50 mg Oral Once   Or  . diphenhydrAMINE  50 mg Intravenous Once  . gentamicin cream  1 application Topical Daily  . heparin  5,000 Units Subcutaneous Q8H  . insulin aspart  0-20 Units Subcutaneous TID WC  . insulin detemir  10 Units Subcutaneous Daily  . lanthanum  1,500 mg Oral TID WC  . multivitamin  1 tablet Oral Daily  . potassium chloride SA  40 mEq Oral Daily  . senna-docusate  1 tablet Oral BID  . sodium chloride flush  3 mL Intravenous Q12H      Otelia Santee, MD 12/27/2018, 8:33 AM

## 2018-12-27 NOTE — Progress Notes (Signed)
OT Cancellation Note  Patient Details Name: [name] MRN: [id number] DOB: [birth year]   Cancelled Treatment:    Reason Eval/Treat Not Completed: Medical issues which prohibited therapy.   Per notes, pt is on continuous cycle peritoneal dialysis. Will check back another day.  Rashmi Tallent 12/27/2018, 7:45 AM  Lesle Chris, OTR/L Acute Rehabilitation Services [phone] [facility] pager [phone] office 12/27/2018

## 2018-12-27 NOTE — Consult Note (Signed)
Chief Complaint: Patient was seen in consultation today for aspiration of abdominal fluid collection/peritoneal dialysis contrast injection.   Referring Physician(s): Dr. Otelia Santee  Supervising Physician: Markus Daft  Patient Status: [facility] - In-pt  History of Present Illness: [name] is a 53 y.o. female with a past medical history significant for anxiety, DM II, HTN, HLD, CHF, CAD, thyroid cancer, chiari malformation, fibromyalgia, lymphedema, recurrent cellulitis and ESRD on peritoneal dialysis who presented to [facility] ED on 12/21/18 with complaints of abdominal pain, leg pain and intermittent fevers. She was found to be influenza B+ and admitted for further evaluation and management. She was transferred to [facility] for dialysis. She continued to have extreme tenderness to palpation of her abdomen as well as intermittent nausea and vomiting - she was started on Augmentin for cellulitis of LLQ abdominal wall and CT abdomen/pelvis w/o contrast was performed today which showed a 2.4 cm deep SQ collection along the Tenckhoff catheter. Request has been made to IR for aspiration of this collection as well as a peritoneal dialysis catheter injection to assess for leaking.  Patient reports she feels ok, continues to have some nausea but no recent vomiting - she is able to eat and drink some. She reports ongoing constipation with recent small hard BM but nothing else for about a week. She reports pain is on the left lower side of her abdomen below her PD catheter and also extends to her pannus/pelvic area. She denies any issues with her PD catheter, she has not noticed any open wounds or drainage near the insertion site.   Past Medical History:  Diagnosis Date  . Anxiety   . Asthma    as a child  . Cancer ([place])    thyroid  . Cellulitis   . CHF (congestive heart failure) ([place])   . Chiari malformation    s/p surgery  . Chiari malformation   . Chronic pain   . Complication of anesthesia  11/28/15   Resp arrest after  conscious  sedation  . Constipation   . Coronary artery disease    40-50% mid LAD 04/29/09, Medical tx. (Dr. Gwenlyn Found)  . Diabetes mellitus    Type II- reports being off all medication d/t it being controlled  . Fibromyalgia   . History of blood transfusion    hemorrage duinrg pregancy  . Hx of echocardiogram 10/2011   EF 55-60%  . Hypercholesterolemia   . Hypertension   . Lymph edema   . Obesity hypoventilation syndrome ([place])   . On home oxygen therapy    "2L; 24/7" (12/21/2018)  . Pneumonia    in past  . Pulmonary hypertension ([place])   . Renal disorder    M/W/F [facility] Pt started dialysis in Dec.2016  . S/P colonoscopy 05/26/2007   Dr. Laural Golden sigmoid diverticulosis random biopsies benign  . S/P endoscopy 05/01/2009   Dr. Penelope Coop pill-induced esophageal ulcerations distal to midesophagus, 2 small ulcers in the antrum of the stomach  . Shortness of breath dyspnea    with any exertion or if heart rate  is irregular while on dialysis  . Sleep apnea    reports that she no longer needs CPAP due to weight loss    Past Surgical History:  Procedure Laterality Date  . Marland KitchenHemodialysis catheter Right 11/28/2015  . A/V FISTULAGRAM N/A 07/26/2017   Procedure: A/V Fistulagram - Left Arm;  Surgeon: Serafina Mitchell, MD;  Location: [place] [facility];  Service: Cardiovascular;  Laterality: N/A;  .  ABDOMINAL HYSTERECTOMY    . ADENOIDECTOMY    . AV FISTULA PLACEMENT Left 03/15/2016   Procedure: CREATION OF LEFT ARM ARTERIOVENOUS (AV) FISTULA  ;  Surgeon: Rosetta Posner, MD;  Location: [place];  Service: Vascular;  Laterality: Left;  . CAPD INSERTION N/A 08/30/2017   Procedure: LAPAROSCOPIC INSERTION CONTINUOUS AMBULATORY PERITONEAL DIALYSIS  (CAPD) CATHETER;  Surgeon: Clovis Riley, MD;  Location: [place];  Service: General;  Laterality: N/A;  . CESAREAN SECTION      x 2  . CRANIECTOMY SUBOCCIPITAL W/ CERVICAL LAMINECTOMY / CHIARI    . FISTULA SUPERFICIALIZATION  Left 05/10/2016   Procedure: Left Arm FISTULA SUPERFICIALIZATION;  Surgeon: Rosetta Posner, MD;  Location: [facility];  Service: Vascular;  Laterality: Left;  . IR GENERIC HISTORICAL  08/14/2016   IR REMOVAL TUN ACCESS W/ PORT W/O FL MOD SED 08/14/2016 Arne Cleveland, MD [facility]  . PERIPHERAL VASCULAR CATHETERIZATION Left 11/25/2016   Procedure: A/V Fistulagram;  Surgeon: Conrad Prosperity, MD;  Location: [place] [facility];  Service: Cardiovascular;  Laterality: Left;  arm  . PORTACATH PLACEMENT  07/05/2012   Procedure: INSERTION PORT-A-CATH;  Surgeon: Donato Heinz, MD;  Location: [facility];  Service: General;  Laterality: Left;  subclavian  . portacath removal    . RIGHT HEART CATH N/A 08/04/2017   Procedure: RIGHT HEART CATH;  Surgeon: Jolaine Artist, MD;  Location: [place] [facility];  Service: Cardiovascular;  Laterality: N/A;  . THYROIDECTOMY, PARTIAL    . TONSILLECTOMY      Allergies: Contrast media [iodinated diagnostic agents]; Pneumococcal vaccines; and Vancomycin  Medications: Prior to Admission medications   Medication Sig Start Date End Date Taking? Authorizing Provider  acetaminophen (TYLENOL) 325 MG tablet Take 650 mg by mouth every 6 (six) hours as needed for moderate pain or fever.    Yes [provider]  albuterol (PROVENTIL) (2.5 MG/3ML) 0.083% nebulizer solution Take 2.5 mg by nebulization every 6 (six) hours as needed for wheezing or shortness of breath.   Yes [provider]  ALPRAZolam Duanne Moron) 0.5 MG tablet Take 0.5 mg by mouth 3 (three) times daily as needed for anxiety or sleep.    Yes [provider]  amLODipine (NORVASC) 10 MG tablet Take 0.5 tablets (5 mg total) by mouth daily. 06/11/16  Yes [name], MD  diphenhydrAMINE (BENADRYL) 25 mg capsule Take 25 mg by mouth every 6 (six) hours as needed for allergies.   Yes [provider]  insulin detemir (LEVEMIR) 100 UNIT/ML injection Inject 10 Units into the skin daily.   Yes  [provider]  lanthanum (FOSRENOL) 500 MG chewable tablet Chew 1,000-1,500 mg by mouth 5 (five) times daily. Patient takes 3 tablets by mouth three times a day with meals and 2 tablets by mouth twice a day with snacks   Yes [provider]  multivitamin (RENA-VIT) TABS tablet Take 1 tablet by mouth daily. 05/30/18  Yes [provider]  Oxycodone HCl 10 MG TABS Take 1 tablet (10 mg total) by mouth 4 (four) times daily as needed (pain). 02/01/17  Yes [name], MD  potassium chloride 20 MEQ TBCR Take 40 mEq by mouth daily. 06/30/18  Yes Oswald Hillock, MD  blood glucose meter kit and supplies KIT Dispense based on patient and insurance preference. Use up to four times daily as directed. (FOR ICD-9 250.00, 250.01). 06/30/18   Oswald Hillock, MD  insulin lispro (HUMALOG) 100 UNIT/ML injection Inject 60 Units  into the skin 2 (two) times daily.    03/08/12  [provider]     Family History  Problem Relation Age of Onset  . Colon cancer Mother 36  . Stroke Mother 74  . Coronary artery disease Mother     Social History   Socioeconomic History  . Marital status: Married    Spouse name: Not on file  . Number of children: 3  . Years of education: Not on file  . Highest education level: Not on file  Occupational History  . Not on file  Social Needs  . Financial resource strain: Not on file  . Food insecurity:    Worry: Not on file    Inability: Not on file  . Transportation needs:    Medical: Not on file    Non-medical: Not on file  Tobacco Use  . Smoking status: Never Smoker  . Smokeless tobacco: Never Used  Substance and Sexual Activity  . Alcohol use: No    Alcohol/week: 0.0 standard drinks  . Drug use: No  . Sexual activity: Never  Lifestyle  . Physical activity:    Days per week: Not on file    Minutes per session: Not on file  . Stress: Not on file  Relationships  . Social connections:    Talks on phone: Not on file    Gets together:  Not on file    Attends religious service: Not on file    Active member of club or organization: Not on file    Attends meetings of clubs or organizations: Not on file    Relationship status: Not on file  Other Topics Concern  . 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  All other systems are negative.  Review of Systems  Constitutional: Negative for appetite change, chills, fatigue and fever.  Respiratory: Negative for cough and shortness of breath.   Cardiovascular: Positive for leg swelling. Negative for chest pain.  Gastrointestinal: Positive for abdominal pain, constipation and nausea. Negative for diarrhea and vomiting.  Genitourinary: Negative for hematuria.  Skin: Negative for rash and wound.  Neurological: Negative for dizziness, syncope and headaches.    Vital Signs: BP 119/63 (BP Location: Right Arm)   Pulse 73   Temp 98.1 F (36.7 C) (Oral)   Resp 20   Ht _0  (1.651 m)   Wt (!) 357 lb 2.3 oz (162 kg)   SpO2 97%   BMI 59.43 kg/m   Physical Exam Vitals signs and nursing note reviewed.  Constitutional:      General: She is not in acute distress.    Appearance: She is obese.  HENT:     Head: Normocephalic and atraumatic.  Cardiovascular:     Rate and Rhythm: Normal rate and regular rhythm.  Pulmonary:     Effort: Pulmonary effort is normal.     Comments: Nasal cannula in place Abdominal:     General: There is no distension.     Palpations: Abdomen is soft.     Tenderness: There is abdominal tenderness (LLQ exquisitely tender to palpation; some tenderness to palpation in pelvic region; minimal pain on right side of abdomen).  Skin:    General: Skin is warm and dry.     Comments: PD catheter in place, dressed appropriately, no active bleeding/discharge/open wounds/erythema/edeam. Palpation of PD catheter does  not produce  pain.  Neurological:     Mental Status: She is alert and oriented to person, place, and time.  Psychiatric:        Mood and Affect: Mood normal.        Behavior: Behavior normal.        Thought Content: Thought content normal.        Judgment: Judgment normal.      Imaging: Ct Abdomen Pelvis Wo Contrast  Result Date: 12/27/2018 CLINICAL DATA:  Worsening abdominal wall cellulitis. EXAM: CT ABDOMEN AND PELVIS WITHOUT CONTRAST TECHNIQUE: Multidetector CT imaging of the abdomen and pelvis was performed following the standard protocol without IV contrast. COMPARISON:  07/14/17 FINDINGS: Lower chest:  Mild atelectasis. Hepatobiliary: No focal liver abnormality.No evidence of biliary obstruction or stone. Pancreas: Unremarkable. Spleen: Unremarkable. Adrenals/Urinary Tract: Negative adrenals. No hydronephrosis or stone. Unremarkable bladder. Stomach/Bowel:  No obstruction. No appendicitis. Vascular/Lymphatic: No acute vascular abnormality. No mass or adenopathy. Reproductive:Hysterectomy Other: Left eccentric abdominal wall skin thickening and subcutaneous reticulation. The wall can not be completely visualized due to patient size. There is a 2.4 cm low-density collection along the patient's left-sided Tenckhoff catheter, just superficial to the muscular wall. No soft tissue emphysema. Trace perihepatic ascites. Musculoskeletal: No acute abnormalities. IMPRESSION: 1. Known left-sided cellulitis/panniculitis. The abdominal wall could not be completely visualized due to patient body habitus. In the region of inflammation is a 2.4 cm deep subcutaneous collection along the Tenckhoff catheter. No soft tissue emphysema. 2. No acute intra-abdominal finding. Electronically Signed   By: Monte Fantasia M.D.   On: 12/27/2018 05:00   Dg Chest Portable 1 View  Result Date: 12/21/2018 CLINICAL DATA:  Shortness of Breath EXAM: PORTABLE CHEST 1 VIEW COMPARISON:  06/27/2018 FINDINGS: Cardiomegaly with vascular congestion.  Perihilar opacities could reflect early interstitial edema. No effusions or acute bony abnormality. IMPRESSION: Cardiomegaly with vascular congestion and possible early perihilar edema. Electronically Signed   By: Rolm Baptise M.D.   On: 12/21/2018 10:49   Vas [place] Lower Extremity Venous (dvt)  Result Date: 12/23/2018  Lower Venous Study Indications: Swelling, Edema, and Pain.  Limitations: Body habitus, poor ultrasound/tissue interface and pain tolerance. Performing Technologist: Abram Sander RVS  Examination Guidelines: A complete evaluation includes B-mode imaging, spectral Doppler, color Doppler, and power Doppler as needed of all accessible portions of each vessel. Bilateral testing is considered an integral part of a complete examination. Limited examinations for reoccurring indications may be performed as noted.  Right Venous Findings: +---------+---------------+---------+-----------+----------+--------------+          CompressibilityPhasicitySpontaneityPropertiesSummary        +---------+---------------+---------+-----------+----------+--------------+ CFV      Full           Yes      Yes                                 +---------+---------------+---------+-----------+----------+--------------+ SFJ      Full           Yes      Yes                                 +---------+---------------+---------+-----------+----------+--------------+ FV Prox  Not visualized +---------+---------------+---------+-----------+----------+--------------+ FV Mid                                                Not visualized +---------+---------------+---------+-----------+----------+--------------+ FV Distal                                             Not visualized +---------+---------------+---------+-----------+----------+--------------+ PFV                                                   Not visualized  +---------+---------------+---------+-----------+----------+--------------+ POP                     Yes      Yes                                 +---------+---------------+---------+-----------+----------+--------------+ PTV                                                   Not visualized +---------+---------------+---------+-----------+----------+--------------+ PERO                                                  Not visualized +---------+---------------+---------+-----------+----------+--------------+  Left Venous Findings: +---------+---------------+---------+-----------+----------+--------------+          CompressibilityPhasicitySpontaneityPropertiesSummary        +---------+---------------+---------+-----------+----------+--------------+ CFV                                                   Not visualized +---------+---------------+---------+-----------+----------+--------------+ SFJ                                                   Not visualized +---------+---------------+---------+-----------+----------+--------------+ FV Prox                                               Not visualized +---------+---------------+---------+-----------+----------+--------------+ FV Mid                                                Not visualized +---------+---------------+---------+-----------+----------+--------------+ FV Distal  Not visualized +---------+---------------+---------+-----------+----------+--------------+ PFV                                                   Not visualized +---------+---------------+---------+-----------+----------+--------------+ POP                     Yes      Yes                                 +---------+---------------+---------+-----------+----------+--------------+ PTV                                                   Not visualized  +---------+---------------+---------+-----------+----------+--------------+ PERO                                                  Not visualized +---------+---------------+---------+-----------+----------+--------------+    Summary: Right: There is no evidence of deep vein thrombosis in the lower extremity. However, portions of this examination were limited- see technologist comments above. No cystic structure found in the popliteal fossa. Left: There is no evidence of deep vein thrombosis in the lower extremity. However, portions of this examination were limited- see technologist comments above. No cystic structure found in the popliteal fossa.  *See table(s) above for measurements and observations. Electronically signed by Ruta Hinds MD on 12/23/2018 at 3:25:29 PM.    Final     Labs:  CBC: Recent Labs    12/23/18 0651 12/24/18 0550 12/25/18 0728 12/27/18 0710  WBC 8.6 8.9 8.6 11.0*  HGB 9.7* 9.4* 9.8* 9.5*  HCT 30.1* 28.8* 29.8* 30.0*  PLT 218 203 210 223    COAGS: No results for input(s): INR, APTT in the last 8760 hours.  BMP: Recent Labs    12/24/18 0550 12/25/18 0728 12/26/18 0717 12/27/18 0710  NA 135 133* 136 132*  K 3.1* 3.9 3.2* 4.7  CL 96* 97* 97* 95*  CO2 _0 GLUCOSE 130* 146* 155* 239*  BUN 40* 41* 42* 45*  CALCIUM 8.5* 9.0 8.8* 8.7*  CREATININE 10.98* 10.65* 11.04* 10.97*  GFRNONAA 4* 4* 4* 4*  GFRAA 4* 4* 4* 4*    LIVER FUNCTION TESTS: Recent Labs    06/27/18 1355  06/30/18 0309 12/21/18 1106 12/22/18 0803 12/27/18 0710  BILITOT 0.6  --   --  0.4  --  0.7  AST 14*  --   --  12*  --  13*  ALT 13  --   --  21  --  15  ALKPHOS 86  --   --  113  --  91  PROT 7.7  --   --  7.6  --  6.6  ALBUMIN 3.5   < > 3.0* 3.2* 2.7* 2.4*   < > = values in this interval not displayed.    TUMOR MARKERS: No results for input(s): AFPTM, CEA, CA199, CHROMGRNA in the last 8760 hours.  Assessment and Plan:  Patient with history of ESRD on PD who  presented to ED  with complaints of abdominal pain, leg pain and intermittent fevers for about a week. She was found to be influenza B+ and to have cellulitis of her abdominal wall for which she was started on Augmentin. CT abdomen/pelvis performed today due to ongoing pain out of proportion to exam which shows a 2.4 cm deep SQ collection along the PD catheter. Request has been made to IR for aspiration of this fluid collection for further analysis. Request has also been made for PD catheter injection to assess for leaking.   Plan for aspiration with local anesthesia only and possible PD injection later this afternoon or tomorrow. This was discussed with patient who is in agreement with plan.   Risks and benefits discussed with the patient including bleeding, infection and damage to adjacent structures.  All of the patient's questions were answered, patient is agreeable to proceed.  Consent signed and in chart.  Thank you for this interesting consult.  I greatly enjoyed meeting PARISA PINELA and look forward to participating in their care.  A copy of this report was sent to the requesting provider on this date.  Electronically Signed: Joaquim Nam, PA-C 12/27/2018, 11:28 AM   I spent a total of 40 Minutes  in face to face in clinical consultation, greater than 50% of which was counseling/coordinating care for abdominal fluid collection aspiration/peritoneal dialysis catheter injection.

## 2018-12-27 NOTE — Progress Notes (Signed)
PROGRESS NOTE    [name]  ANV:[id number] DOB: July 12, 1966 DOA: 12/21/2018 PCP: [name], MD     Brief Narrative:  [name] is a 53 y.o. female with ESRD on PD presented to ED by EMS with complaints of 10/10 pain in bilateral legs and abdomen.  She feels like she might have a recurrence of cellulitis in her legs which she has had multiple times in the past. She reports that she had dialysis at home last night.  She reports that her legs have increased in size recently.  She has chronic lymphedema in both legs which is severe.  She reports abdominal pain in LLQ below her PD cath site.  She reports intermittent fever at home but no chills.  She denies nausea and vomiting.  She has been concerned about blood clots in legs as she is very immobile.  She tested positive for flu B and was placed on Tamiflu.  There was concern for peritonitis, patient underwent paracentesis which was negative for peritonitis.  Nephrology was consulted.  She was started on Augmentin for left lower quadrant abdominal wall cellulitis.  New events last 24 hours / Subjective: Ongoing lower abdominal wall pain.  No fever or chills.  No nausea or vomiting.  Assessment & Plan:   Principal Problem:   Influenza B Active Problems:   Lymphedema of lower extremity   Diabetes mellitus (HCC)   Morbid obesity (HCC)   HTN (hypertension)   Chronic diastolic CHF (congestive heart failure) (HCC)   ESRD on dialysis (HCC)   ESRD (end stage renal disease) (HCC)   Cor pulmonale, chronic (HCC)   Hypokalemia   Peritonitis (HCC)   Abdominal pain, LLQ   Influenza B Tamiflu received 64m dose, which is full 5 day course for pt on PD. Discussed with pharmacist. Droplet precaution.  Flu symptoms seem to have resolved.  Nonpurulent cellulitis/panniculitis LLQ abdominal wall/subcutaneous collection on CT Augmentin started  Without any clinical improvement over the past couple of days.  Remains extremely tender to  palpation.   Hence CT abdomen without contrast was obtained on 1/7 which revealed 2.4 cm deep subcutaneous collection along the PD catheter site. IR consultation appreciated-consulted for aspiration of fluid for assessment and possible PD catheter injection to rule out leaking. As discussed with pharmacy, change Augmentin to Keflex.  Constipation Bowel regimen ordered, suppository prn   Bilateral leg pain/chronic lymphedema DVT ultrasound was negative  ESRD on peritoneal dialysis Nephrology consulted and undergoing PD under their direction.  Type 2 diabetes Levemir, sliding-scale insulin.  Mildly uncontrolled and fluctuating.  Consider adjusting insulin doses as needed if trend continues.  Essential hypertension Continue Norvasc.  Controlled.  Morbid obesity Body mass index is 60.02 kg/m.   Anxiety Continue Xanax   DVT prophylaxis: Subq hep Code Status: Full Family Communication:  No family at bedside Disposition Plan: Pending further improvement in abdominal wall cellulitis    Consultants:   Nephrology  Interventional radiology  Procedures:   PD   Antimicrobials:  Anti-infectives (From admission, onward)   Start     Dose/Rate Route Frequency Ordered Stop   12/23/18 1200  amoxicillin-clavulanate (AUGMENTIN) 500-125 MG per tablet 500 mg     1 tablet Oral Every 24 hours 12/23/18 1022     12/23/18 1015  amoxicillin-clavulanate (AUGMENTIN) 875-125 MG per tablet 1 tablet  Status:  Discontinued     1 tablet Oral Every 12 hours 12/23/18 1014 12/23/18 1021   12/22/18 1000  oseltamivir (TAMIFLU) capsule 30 mg  Status:  Discontinued     30 mg Oral Daily 12/21/18 1631 12/21/18 1738   12/21/18 1700  cefTAZidime (FORTAZ) 2 g in sodium chloride 0.9 % 100 mL IVPB  Status:  Discontinued     2 g 200 mL/hr over 30 Minutes Intravenous  Once 12/21/18 1655 12/21/18 2016   12/21/18 1700  ceFAZolin (ANCEF) IVPB 1 g/50 mL premix  Status:  Discontinued     1 g 100 mL/hr over 30  Minutes Intravenous  Once 12/21/18 1655 12/21/18 2016   12/21/18 1215  oseltamivir (TAMIFLU) capsule 30 mg     30 mg Oral  Once 12/21/18 1209 12/21/18 1307       Objective: Vitals:   12/27/18 0810 12/27/18 0947 12/27/18 1647 12/27/18 1740  BP: (!) 143/76 119/63 121/61 (!) 130/46  Pulse: 65 73 67 68  Resp: _0 Temp: 98.3 F (36.8 C) 98.1 F (36.7 C) (!) 97.4 F (36.3 C) 98.3 F (36.8 C)  TempSrc: Oral Oral Oral Oral  SpO2: 100% 97% 98% 98%  Weight: (!) 162 kg   (!) 163.6 kg  Height:        Intake/Output Summary (Last 24 hours) at 12/27/2018 1836 Last data filed at 12/27/2018 1418 Gross per 24 hour  Intake 780 ml  Output 350 ml  Net 430 ml   Filed Weights   12/26/18 2042 12/27/18 0810 12/27/18 1740  Weight: (!) 161.3 kg (!) 162 kg (!) 163.6 kg    Examination: General exam: Pleasant young female, moderately built and morbidly obese, lying comfortably propped up in bed. Respiratory system: Clear to auscultation. Respiratory effort normal.  Stable. Cardiovascular system: S1 & S2 heard, RRR. No JVD, murmurs, rubs, gallops or clicks. No pedal edema.  Not on telemetry. Gastrointestinal system: Abdomen is obese.  Left-sided PD catheter site intact.  Difficulty examining due to body habitus and pain/tenderness at lower abdominal/panniculitis.  Induration +.  No erythema or increased warmth. Central nervous system: Alert and oriented. No focal neurological deficits. Extremities: Symmetric 5 x 5 power.  Changes of chronic severe bilateral lower extremity lymphedema without acute findings. Skin: +LLQ abdominal wall with induration, edema, warmth, and TTP  Psychiatry: Judgement and insight appear normal. Mood & affect appropriate.    Data Reviewed: I have personally reviewed following labs and imaging studies  CBC: Recent Labs  Lab 12/21/18 1106  12/22/18 0803 12/23/18 0651 12/24/18 0550 12/25/18 0728 12/27/18 0710  WBC 10.8*   < > 9.0 8.6 8.9 8.6 11.0*  NEUTROABS  9.3*  --  6.4 6.0 6.1 6.1  --   HGB 10.2*   < > 9.7* 9.7* 9.4* 9.8* 9.5*  HCT 33.5*   < > 29.1* 30.1* 28.8* 29.8* 30.0*  MCV 102.8*   < > 99.0 102.0* 101.8* 100.7* 99.7  PLT 219   < > PLATELET CLUMPS NOTED ON SMEAR, UNABLE TO ESTIMATE 218 203 210 223   < > = values in this interval not displayed.   Basic Metabolic Panel: Recent Labs  Lab 12/21/18 1134  12/22/18 0803 12/23/18 0651 12/24/18 0550 12/25/18 0728 12/26/18 0717 12/27/18 0710  NA  --   --  135 135 135 133* 136 132*  K  --   --  4.1 2.8* 3.1* 3.9 3.2* 4.7  CL  --   --  97* 94* 96* 97* 97* 95*  CO2  --   --  _1 GLUCOSE  --   --  142* 136*  130* 146* 155* 239*  BUN  --   --  42* 38* 40* 41* 42* 45*  CREATININE  --    < > 10.28* 10.52* 10.98* 10.65* 11.04* 10.97*  CALCIUM  --   --  8.9 8.6* 8.5* 9.0 8.8* 8.7*  MG 1.7  --  1.8  --   --   --   --   --   PHOS  --   --  6.4*  --   --   --   --   --    < > = values in this interval not displayed.   GFR: Estimated Creatinine Clearance: 9.4 mL/min (A) (by C-G formula based on SCr of 10.97 mg/dL (H)). Liver Function Tests: Recent Labs  Lab 12/21/18 1106 12/22/18 0803 12/27/18 0710  AST 12*  --  13*  ALT 21  --  15  ALKPHOS 113  --  91  BILITOT 0.4  --  0.7  PROT 7.6  --  6.6  ALBUMIN 3.2* 2.7* 2.4*   Recent Labs  Lab 12/21/18 1106  LIPASE 66*   CBG: Recent Labs  Lab 12/26/18 1708 12/26/18 2041 12/27/18 0757 12/27/18 1103 12/27/18 1646  GLUCAP 151* 272* 246* 276* 209*   Anemia Panel: Recent Labs    12/27/18 0710  FERRITIN 1,085*  TIBC 174*  IRON 35    Recent Results (from the past 240 hour(s))  Culture, blood (routine x 2)     Status: None   Collection Time: 12/21/18 11:06 AM  Result Value Ref Range Status   Specimen Description BLOOD BLOOD RIGHT WRIST  Final   Special Requests   Final    BOTTLES DRAWN AEROBIC ONLY Blood Culture results may not be optimal due to an inadequate volume of blood received in culture bottles   Culture    Final    NO GROWTH 5 DAYS Performed at [facility], [address], [place], [place] [address]    Report Status 12/26/2018 FINAL  Final  MRSA PCR Screening     Status: None   Collection Time: 12/21/18  6:14 PM  Result Value Ref Range Status   MRSA by PCR NEGATIVE NEGATIVE Final    Comment:        The GeneXpert MRSA Assay (FDA approved for NASAL specimens only), is one component of a comprehensive MRSA colonization surveillance program. It is not intended to diagnose MRSA infection nor to guide or monitor treatment for MRSA infections. Performed at [facility], [place] [address], [place], [place] [address]   Culture, body fluid-bottle     Status: None   Collection Time: 12/21/18  8:18 PM  Result Value Ref Range Status   Specimen Description FLUID PERITONEAL DIALYSATE  Final   Special Requests BOTTLES DRAWN AEROBIC AND ANAEROBIC  Final   Culture   Final    NO GROWTH 5 DAYS Performed at [facility], [place] [address] [place], [place] [address]    Report Status 12/26/2018 FINAL  Final  Gram stain     Status: None   Collection Time: 12/21/18  8:18 PM  Result Value Ref Range Status   Specimen Description FLUID PERITONEAL DIALYSATE  Final   Special Requests NONE  Final   Gram Stain   Final    WBC PRESENT,BOTH PMN AND MONONUCLEAR NO ORGANISMS SEEN CYTOSPIN SMEAR Performed at [facility], [address], [place], [place] [address]    Report Status 12/21/2018 FINAL  Final  Radiology Studies: Ct Abdomen Pelvis Wo Contrast  Result Date: 12/27/2018 CLINICAL DATA:  Worsening abdominal wall cellulitis. EXAM: CT ABDOMEN AND PELVIS WITHOUT CONTRAST TECHNIQUE: Multidetector CT imaging of the abdomen and pelvis was performed following the standard protocol without IV contrast. COMPARISON:  07/14/17 FINDINGS: Lower chest:  Mild atelectasis. Hepatobiliary: No focal liver abnormality.No evidence of biliary obstruction or stone. Pancreas: Unremarkable. Spleen:  Unremarkable. Adrenals/Urinary Tract: Negative adrenals. No hydronephrosis or stone. Unremarkable bladder. Stomach/Bowel:  No obstruction. No appendicitis. Vascular/Lymphatic: No acute vascular abnormality. No mass or adenopathy. Reproductive:Hysterectomy Other: Left eccentric abdominal wall skin thickening and subcutaneous reticulation. The wall can not be completely visualized due to patient size. There is a 2.4 cm low-density collection along the patient's left-sided Tenckhoff catheter, just superficial to the muscular wall. No soft tissue emphysema. Trace perihepatic ascites. Musculoskeletal: No acute abnormalities. IMPRESSION: 1. Known left-sided cellulitis/panniculitis. The abdominal wall could not be completely visualized due to patient body habitus. In the region of inflammation is a 2.4 cm deep subcutaneous collection along the Tenckhoff catheter. No soft tissue emphysema. 2. No acute intra-abdominal finding. Electronically Signed   By: Monte Fantasia M.D.   On: 12/27/2018 05:00      Scheduled Meds: . amLODipine  5 mg Oral Daily  . amoxicillin-clavulanate  1 tablet Oral Q24H  . diphenhydrAMINE  50 mg Oral Once   Or  . diphenhydrAMINE  50 mg Intravenous Once  . diphenhydrAMINE  50 mg Oral Once   Or  . diphenhydrAMINE  50 mg Intravenous Once  . gentamicin cream  1 application Topical Daily  . heparin  5,000 Units Subcutaneous Q8H  . insulin aspart  0-20 Units Subcutaneous TID WC  . insulin detemir  10 Units Subcutaneous Daily  . lanthanum  1,500 mg Oral TID WC  . multivitamin  1 tablet Oral Daily  . potassium chloride SA  40 mEq Oral Daily  . senna-docusate  1 tablet Oral BID  . sodium chloride flush  3 mL Intravenous Q12H   Continuous Infusions: . sodium chloride    . dialysis solution 2.5% low-MG/low-CA    . dialysis solution 2.5% low-MG/low-CA    . dialysis solution 2.5% low-MG/low-CA       LOS: 6 days    Vernell Leep, MD, FACP, [facility]. [facility] Pager  [phone]  If 7PM-7AM, please contact night-coverage [email or web address] Password [facility] 12/27/2018, 6:45 PM

## 2018-12-28 ENCOUNTER — Encounter (HOSPITAL_COMMUNITY): Payer: Self-pay | Admitting: Diagnostic Radiology

## 2018-12-28 ENCOUNTER — Inpatient Hospital Stay (HOSPITAL_COMMUNITY): Payer: Medicare Other

## 2018-12-28 HISTORY — PX: IR US GUIDE BX ASP/DRAIN: IMG2392

## 2018-12-28 LAB — GLUCOSE, CAPILLARY
GLUCOSE-CAPILLARY: 185 mg/dL — AB (ref 70–99)
Glucose-Capillary: 226 mg/dL — ABNORMAL HIGH (ref 70–99)
Glucose-Capillary: 146 mg/dL — ABNORMAL HIGH (ref 70–99)
Glucose-Capillary: 142 mg/dL — ABNORMAL HIGH (ref 70–99)

## 2018-12-28 LAB — BASIC METABOLIC PANEL
Anion gap: 12 (ref 5–15)
BUN: 54 mg/dL — ABNORMAL HIGH (ref 6–20)
CO2: 26 mmol/L (ref 22–32)
CREATININE: 11.09 mg/dL — AB (ref 0.44–1.00)
Calcium: 8.8 mg/dL — ABNORMAL LOW (ref 8.9–10.3)
Chloride: 95 mmol/L — ABNORMAL LOW (ref 98–111)
GFR calc Af Amer: 4 mL/min — ABNORMAL LOW (ref >60)
GFR calc non Af Amer: 4 mL/min — ABNORMAL LOW (ref >60)
Glucose, Bld: 193 mg/dL — ABNORMAL HIGH (ref 70–99)
Potassium: 3.9 mmol/L (ref 3.5–5.1)
Sodium: 133 mmol/L — ABNORMAL LOW (ref 135–145)

## 2018-12-28 MED ORDER — LIDOCAINE HCL 1 % IJ SOLN
INTRAMUSCULAR | Status: AC | PRN
  Administered 2018-12-28: 5 mL

## 2018-12-28 MED ORDER — LIDOCAINE HCL 1 % IJ SOLN
INTRAMUSCULAR | Status: AC
  Filled 2018-12-28: qty 20

## 2018-12-28 NOTE — Progress Notes (Signed)
PROGRESS NOTE    [name]  SKA:[id number] DOB: [birth year] DOA: 12/21/2018 PCP: [name], MD     Brief Narrative:  [name] is a 53 y.o. female with ESRD on PD presented to ED by EMS with complaints of 10/10 pain in bilateral legs and abdomen.  She feels like she might have a recurrence of cellulitis in her legs which she has had multiple times in the past. She reports that she had dialysis at home last night.  She reports that her legs have increased in size recently.  She has chronic lymphedema in both legs which is severe.  She reports abdominal pain in LLQ below her PD cath site.  She reports intermittent fever at home but no chills.  She denies nausea and vomiting.  She has been concerned about blood clots in legs as she is very immobile.  She tested positive for flu B and was placed on Tamiflu.  There was concern for peritonitis, patient underwent paracentesis which was negative for peritonitis.  Nephrology was consulted.  She was started on Augmentin for left lower quadrant abdominal wall cellulitis.  CT abdomen showed fluid collection near PD catheter.  IR consulted and minimal/1 mL blood-tinged fluid aspirated.  New events last 24 hours / Subjective: Patient seen this morning prior to procedure.  Stated that her abdominal pain was somewhat better, rated at 7/10 this morning.  Denied any other complaints.  Assessment & Plan:   Principal Problem:   Influenza B Active Problems:   Lymphedema of lower extremity   Diabetes mellitus (HCC)   Morbid obesity (HCC)   HTN (hypertension)   Chronic diastolic CHF (congestive heart failure) (HCC)   ESRD on dialysis (HCC)   ESRD (end stage renal disease) (HCC)   Cor pulmonale, chronic (HCC)   Hypokalemia   Peritonitis (HCC)   Abdominal pain, LLQ   Influenza B Tamiflu received 93m dose, which is full 5 day course for pt on PD. Discussed with pharmacist. Droplet precaution.  Flu symptoms have resolved.  Nonpurulent  cellulitis/panniculitis LLQ abdominal wall/subcutaneous collection on CT Augmentin started  Without any clinical improvement over the past couple of days.  Remains extremely tender to palpation.   Hence CT abdomen without contrast was obtained on 1/7 which revealed 2.4 cm deep subcutaneous collection along the PD catheter site. IR consultation appreciated- and were unable to aspirate more than 1 mL and fluid sent for culture. Plan is for patient to rule out PD catheter leak in a.m. by premeds followed by contrasted study. As discussed with pharmacy, changed Augmentin to Keflex.  Constipation Bowel regimen ordered, suppository prn   Bilateral leg pain/chronic lymphedema DVT ultrasound was negative  ESRD on peritoneal dialysis Nephrology consulted and undergoing PD under their direction.  PD catheter evaluation as indicated above.  Patient does not wish to go on HD even temporarily but if catheter does show a leak, she will have to.  Type 2 diabetes Levemir, sliding-scale insulin.  Mildly uncontrolled and fluctuating.  Consider adjusting insulin doses as needed if trend continues.  No changes today.  Essential hypertension Continue Norvasc.  Controlled.  Morbid obesity Body mass index is 60.02 kg/m.   Anxiety Continue Xanax   DVT prophylaxis: Subq hep Code Status: Full Family Communication:  No family at bedside Disposition Plan: Pending further improvement in abdominal wall cellulitis    Consultants:   Nephrology  Interventional radiology  Procedures:   PD  Aspiration of abdominal wall collection by IR on 1/9.  Antimicrobials:  Anti-infectives (From admission, onward)   Start     Dose/Rate Route Frequency Ordered Stop   12/27/18 2200  cephALEXin (KEFLEX) capsule 500 mg     500 mg Oral Every 12 hours 12/27/18 1846     12/23/18 1200  amoxicillin-clavulanate (AUGMENTIN) 500-125 MG per tablet 500 mg  Status:  Discontinued     1 tablet Oral Every 24 hours 12/23/18  1022 12/27/18 1846   12/23/18 1015  amoxicillin-clavulanate (AUGMENTIN) 875-125 MG per tablet 1 tablet  Status:  Discontinued     1 tablet Oral Every 12 hours 12/23/18 1014 12/23/18 1021   12/22/18 1000  oseltamivir (TAMIFLU) capsule 30 mg  Status:  Discontinued     30 mg Oral Daily 12/21/18 1631 12/21/18 1738   12/21/18 1700  cefTAZidime (FORTAZ) 2 g in sodium chloride 0.9 % 100 mL IVPB  Status:  Discontinued     2 g 200 mL/hr over 30 Minutes Intravenous  Once 12/21/18 1655 12/21/18 2016   12/21/18 1700  ceFAZolin (ANCEF) IVPB 1 g/50 mL premix  Status:  Discontinued     1 g 100 mL/hr over 30 Minutes Intravenous  Once 12/21/18 1655 12/21/18 2016   12/21/18 1215  oseltamivir (TAMIFLU) capsule 30 mg     30 mg Oral  Once 12/21/18 1209 12/21/18 1307       Objective: Vitals:   12/28/18 1400 12/28/18 1430 12/28/18 1502 12/28/18 1714  BP: 106/60 139/79  119/67  Pulse: 70 78  67  Resp: _0 Temp:    97.8 F (36.6 C)  TempSrc:    Oral  SpO2: 98% 98% 98% 98%  Weight:      Height:        Intake/Output Summary (Last 24 hours) at 12/28/2018 1735 Last data filed at 12/28/2018 1327 Gross per 24 hour  Intake 806 ml  Output 0 ml  Net 806 ml   Filed Weights   12/26/18 2042 12/27/18 0810 12/27/18 1740  Weight: (!) 161.3 kg (!) 162 kg (!) 163.6 kg    Examination: General exam: Pleasant young female, moderately built and morbidly obese, lying comfortably propped up in bed. Respiratory system: Clear to auscultation.  No increased work of breathing. Cardiovascular system: S1 & S2 heard, RRR. No JVD, murmurs, rubs, gallops or clicks. No pedal edema.  Not on telemetry.  Stable. Gastrointestinal system: Abdomen is obese.  Left-sided PD catheter site intact.  Difficulty examining due to body habitus and pain/tenderness at lower abdominal/panniculitis.  Induration +.  No erythema or increased warmth.  No significant change. Central nervous system: Alert and oriented. No focal neurological  deficits. Extremities: Symmetric 5 x 5 power.  Changes of chronic severe bilateral lower extremity lymphedema without acute findings. Skin: +LLQ abdominal wall with induration, edema, warmth, and TTP  Psychiatry: Judgement and insight appear normal. Mood & affect appropriate.    Data Reviewed: I have personally reviewed following labs and imaging studies  CBC: Recent Labs  Lab 12/22/18 0803 12/23/18 0651 12/24/18 0550 12/25/18 0728 12/27/18 0710  WBC 9.0 8.6 8.9 8.6 11.0*  NEUTROABS 6.4 6.0 6.1 6.1  --   HGB 9.7* 9.7* 9.4* 9.8* 9.5*  HCT 29.1* 30.1* 28.8* 29.8* 30.0*  MCV 99.0 102.0* 101.8* 100.7* 99.7  PLT PLATELET CLUMPS NOTED ON SMEAR, UNABLE TO ESTIMATE 218 203 210 157   Basic Metabolic Panel: Recent Labs  Lab 12/22/18 0803  12/24/18 0550 12/25/18 0728 12/26/18 0717 12/27/18 0710 12/28/18 1151  NA 135   < >  135 133* 136 132* 133*  K 4.1   < > 3.1* 3.9 3.2* 4.7 3.9  CL 97*   < > 96* 97* 97* 95* 95*  CO2 22   < > _0 GLUCOSE 142*   < > 130* 146* 155* 239* 193*  BUN 42*   < > 40* 41* 42* 45* 54*  CREATININE 10.28*   < > 10.98* 10.65* 11.04* 10.97* 11.09*  CALCIUM 8.9   < > 8.5* 9.0 8.8* 8.7* 8.8*  MG 1.8  --   --   --   --   --   --   PHOS 6.4*  --   --   --   --   --   --    < > = values in this interval not displayed.   GFR: Estimated Creatinine Clearance: 9.3 mL/min (A) (by C-G formula based on SCr of 11.09 mg/dL (H)). Liver Function Tests: Recent Labs  Lab 12/22/18 0803 12/27/18 0710  AST  --  13*  ALT  --  15  ALKPHOS  --  91  BILITOT  --  0.7  PROT  --  6.6  ALBUMIN 2.7* 2.4*   CBG: Recent Labs  Lab 12/27/18 1646 12/27/18 2106 12/28/18 0744 12/28/18 1129 12/28/18 1714  GLUCAP 209* 252* 226* 185* 146*   Anemia Panel: Recent Labs    12/27/18 0710  FERRITIN 1,085*  TIBC 174*  IRON 35    Recent Results (from the past 240 hour(s))  Culture, blood (routine x 2)     Status: None   Collection Time: 12/21/18 11:06 AM  Result  Value Ref Range Status   Specimen Description BLOOD BLOOD RIGHT WRIST  Final   Special Requests   Final    BOTTLES DRAWN AEROBIC ONLY Blood Culture results may not be optimal due to an inadequate volume of blood received in culture bottles   Culture   Final    NO GROWTH 5 DAYS Performed at [facility], [address], [place], [place] [address]    Report Status 12/26/2018 FINAL  Final  MRSA PCR Screening     Status: None   Collection Time: 12/21/18  6:14 PM  Result Value Ref Range Status   MRSA by PCR NEGATIVE NEGATIVE Final    Comment:        The GeneXpert MRSA Assay (FDA approved for NASAL specimens only), is one component of a comprehensive MRSA colonization surveillance program. It is not intended to diagnose MRSA infection nor to guide or monitor treatment for MRSA infections. Performed at [facility], [place] [address], [place], [place] [address]   Culture, body fluid-bottle     Status: None   Collection Time: 12/21/18  8:18 PM  Result Value Ref Range Status   Specimen Description FLUID PERITONEAL DIALYSATE  Final   Special Requests BOTTLES DRAWN AEROBIC AND ANAEROBIC  Final   Culture   Final    NO GROWTH 5 DAYS Performed at [facility], [place] [address], [place], [place] [address]    Report Status 12/26/2018 FINAL  Final  Gram stain     Status: None   Collection Time: 12/21/18  8:18 PM  Result Value Ref Range Status   Specimen Description FLUID PERITONEAL DIALYSATE  Final   Special Requests NONE  Final   Gram Stain   Final    WBC PRESENT,BOTH PMN AND MONONUCLEAR NO ORGANISMS SEEN CYTOSPIN SMEAR Performed at [facility], [address] [place], [place] [address]    Report Status 12/21/2018 FINAL  Final  Aerobic/Anaerobic Culture (surgical/deep wound)     Status: None (Preliminary result)   Collection Time: 12/28/18 10:53 AM  Result Value Ref Range Status   Specimen Description FLUID  Final   Special Requests NONE  Final   Gram Stain   Final      RARE WBC PRESENT, PREDOMINANTLY MONONUCLEAR NO ORGANISMS SEEN Performed at [facility], [place] [address], [place],  [address]    Culture PENDING  Incomplete   Report Status PENDING  Incomplete       Radiology Studies: Ct Abdomen Pelvis Wo Contrast  Result Date: 12/27/2018 CLINICAL DATA:  Worsening abdominal wall cellulitis. EXAM: CT ABDOMEN AND PELVIS WITHOUT CONTRAST TECHNIQUE: Multidetector CT imaging of the abdomen and pelvis was performed following the standard protocol without IV contrast. COMPARISON:  07/14/17 FINDINGS: Lower chest:  Mild atelectasis. Hepatobiliary: No focal liver abnormality.No evidence of biliary obstruction or stone. Pancreas: Unremarkable. Spleen: Unremarkable. Adrenals/Urinary Tract: Negative adrenals. No hydronephrosis or stone. Unremarkable bladder. Stomach/Bowel:  No obstruction. No appendicitis. Vascular/Lymphatic: No acute vascular abnormality. No mass or adenopathy. Reproductive:Hysterectomy Other: Left eccentric abdominal wall skin thickening and subcutaneous reticulation. The wall can not be completely visualized due to patient size. There is a 2.4 cm low-density collection along the patient's left-sided Tenckhoff catheter, just superficial to the muscular wall. No soft tissue emphysema. Trace perihepatic ascites. Musculoskeletal: No acute abnormalities. IMPRESSION: 1. Known left-sided cellulitis/panniculitis. The abdominal wall could not be completely visualized due to patient body habitus. In the region of inflammation is a 2.4 cm deep subcutaneous collection along the Tenckhoff catheter. No soft tissue emphysema. 2. No acute intra-abdominal finding. Electronically Signed   By: Monte Fantasia M.D.   On: 12/27/2018 05:00   Ir US Guide Bx Asp/drain  Result Date: 12/28/2018 INDICATION: 53 year old with end-stage renal disease and left abdominal peritoneal dialysis catheter. Patient has cellulitis in the left lower abdomen and a small fluid collection  around the PD catheter in the deep subcutaneous tissues. Request for a PD catheter injection and aspiration of the peri-catheter fluid. EXAM: ULTRASOUND-GUIDED ASPIRATION OF THE ABDOMINAL SUBCUTANEOUS FLUID COLLECTION MEDICATIONS: None ANESTHESIA/SEDATION: None COMPLICATIONS: None immediate. PROCEDURE: Informed written consent was obtained from the patient after a thorough discussion of the procedural risks, benefits and alternatives. All questions were addressed. Maximal Sterile Barrier Technique was utilized including caps, mask, sterile gowns, sterile gloves, sterile drape, hand hygiene and skin antiseptic. A timeout was performed prior to the initiation of the procedure. Abdomen was evaluated with ultrasound. The PD catheter was easily identified in the subcutaneous tissues. Heterogeneous hypoechoic material around the catheter just superficial to the peritoneal cavity. The overlying skin was prepped with chlorhexidine and sterile field was created. Skin was anesthetized with 1% lidocaine. 18 gauge trocar needle was directed into the pericatheter collection with ultrasound guidance. Approximately 1 mL of serosanguineous fluid was aspirated. A new 18 gauge needle was directed slightly more caudal but only a few additional drops of blood tinged fluid could be obtained. FINDINGS: Complex collection around the peritoneal catheter in the deep subcutaneous tissues. Approximately 1 mL of serosanguineous fluid was removed. Residual hypoechoic material around the catheter may be related to chronic inflammatory process but nonspecific. IMPRESSION: Ultrasound-guided aspiration of the small complex collection around the peritoneal dialysis catheter in the abdominal subcutaneous tissues. Small amount of fluid was obtained and sent for culture. Patient will receive premedications for contrast allergy prior to injection  of the PD catheter. Electronically Signed   By: Markus Daft M.D.   On: 12/28/2018 13:06      Scheduled  Meds: . amLODipine  5 mg Oral Daily  . cephALEXin  500 mg Oral Q12H  . diphenhydrAMINE  50 mg Oral Once   Or  . diphenhydrAMINE  50 mg Intravenous Once  . diphenhydrAMINE  50 mg Oral Once   Or  . diphenhydrAMINE  50 mg Intravenous Once  . gentamicin cream  1 application Topical Daily  . heparin  5,000 Units Subcutaneous Q8H  . insulin aspart  0-20 Units Subcutaneous TID WC  . insulin detemir  10 Units Subcutaneous Daily  . lanthanum  1,500 mg Oral TID WC  . lidocaine      . multivitamin  1 tablet Oral Daily  . potassium chloride SA  40 mEq Oral Daily  . senna-docusate  1 tablet Oral BID  . sodium chloride flush  3 mL Intravenous Q12H   Continuous Infusions: . sodium chloride    . dialysis solution 2.5% low-MG/low-CA    . dialysis solution 2.5% low-MG/low-CA    . dialysis solution 2.5% low-MG/low-CA       LOS: 7 days    Vernell Leep, MD, FACP, [facility]. [facility] Pager [phone]  If 7PM-7AM, please contact night-coverage [email or web address] Password [facility] 12/28/2018, 5:35 PM

## 2018-12-28 NOTE — Evaluation (Addendum)
Physical Therapy Evaluation Patient Details Name: [name] MRN: [id number] DOB: Oct 27, 1966 Today's Date: 12/28/2018   History of Present Illness  [name] is a 53 y.o. female with ESRD on PD presented to [facility] ED by EMS with complaints of 10/10 pain in bilateral legs and abdomen.  She feels like she might have a recurrence of cellulitis in her legs which she has had multiple times in the past. She reports that she had dialysis at home last night.  She reports that her legs have increased in size recently.  She has chronic lymphedema in both legs which is severe.  She reports abdominal pain in LLQ below her PD cath site.  She reports intermittent fever at home but no chills.  She denies nausea and vomiting.  She has been concerned about blood clots in legs as she is very immobile.  She tested positive for flu B and was placed on Tamiflu.  There was concern for peritonitis, patient underwent paracentesis which was negative for peritonitis  Clinical Impression  Pt admitted with above diagnosis. Pt currently with functional limitations due to the deficits listed below (see "PT Problem List"). Upon entry, pt in bed, no family/caregiver present. The pt is awake and agreeable to participate, reports slight improved pain (5/10). The pt is alert and oriented x3, pleasant, conversational, and following simple commands consistently. Pt puts forth maximal effort and 90 seconds time for semi-recumbent to EOB. Return to supine requires Total A +2. Pt attempts transfers x3, standing limited to 15-20 seconds with BUE support on chairback or RW. Pt very fatigued after session. Functional mobility assessment demonstrates increased effort/time requirements, poor tolerance, and need for physical assistance, whereas the patient performed these at a higher level of independence PTA. PTA pt was AMB to access bathroom only, electric WC for all other mobility. No family at home is physically capable to assisting this  patient in current state, even with mechanical lift if available. Pt will benefit from skilled PT intervention to increase independence and safety with basic mobility in preparation for discharge to the venue listed below.       Follow Up Recommendations CIR ;Supervision/Assistance - 24 hour;Supervision for mobility/OOB    Equipment Recommendations  None recommended by PT(If pt returns to home, she will need a hoyer lift and BSC (but there is no family that can help her with a mechanical lift) )    Recommendations for Other Services       Precautions / Restrictions Precautions Precautions: Fall Restrictions Weight Bearing Restrictions: No      Mobility  Bed Mobility   Bed Mobility: Supine to Sit;Sit to Supine     Supine to sit: Supervision Sit to supine: Total assist;+2 for physical assistance   General bed mobility comments: assists with arms, but BLE very heavy and weak 2/2 lymphedema fluid.   Transfers Overall transfer level: Needs assistance Equipment used: Rolling walker (2 wheeled) Transfers: Sit to/from Stand Sit to Stand: Min guard         General transfer comment: performed 3x from EOB, slight elevation, mostly able to rise for 10-15 seconds perriods, with heavy BU Edependenec for stability, large posterior habitus at buttocks still in contact with bed, but unsafe to move aware from bed.   Ambulation/Gait Ambulation/Gait assistance: (too weak to attempt at this time; pt AMB PTA to get into BR. )              Stairs  Wheelchair Mobility    Modified Rankin (Stroke Patients Only)       Balance Overall balance assessment: Mild deficits observed, not formally tested;Modified Independent                                           Pertinent Vitals/Pain Pain Assessment: 0-10 Pain Score: 5  Pain Location: top of legs and lower abdomen.  Pain Descriptors / Indicators: Aching;Discomfort Pain Intervention(s): Limited  activity within patient's tolerance    Home Living Family/patient expects to be discharged to:: Private residence Living Arrangements: Spouse/significant other;Children;Other relatives(husband, granddaughter, adn Son when in town)     Home Access: Ramped entrance     Home Layout: One level Home Equipment: Wheelchair - power;Cane - single point;Walker - 2 wheels;Wheelchair - manual Additional Comments: states she uses power w/c most frequently, but has to use cane to get into BR from w/c because w/c will not fit. States she rarely uses walker    Prior Function     Gait / Transfers Assistance Needed: reports uses power wheelchair for majority of time. Only walks to acess bathroom with SPC.   ADL's / Homemaking Assistance Needed: Family helsp with bathing legs; she is able to toilet self, but family helsp with socks.        Hand Dominance   Dominant Hand: Right    Extremity/Trunk Assessment        Lower Extremity Assessment Lower Extremity Assessment: Generalized weakness(chornic history of Lymphedema; baseilne help for legs into bed) RLE Deficits / Details: severe brawny edema LLE Deficits / Details: severe brawny edema with increased significance on L side       Communication   Communication: No difficulties  Cognition Arousal/Alertness: Awake/alert Behavior During Therapy: WFL for tasks assessed/performed Overall Cognitive Status: Within Functional Limits for tasks assessed                                        General Comments      Exercises     Assessment/Plan    PT Assessment Patient needs continued PT services  PT Problem List Decreased strength;Decreased activity tolerance;Decreased mobility;Decreased balance;Obesity       PT Treatment Interventions Therapeutic exercise;Functional mobility training;Therapeutic activities;Patient/family education;Balance training    PT Goals (Current goals can be found in the Care Plan section)   Acute Rehab PT Goals Patient Stated Goal: restore basic mobility to PLOF  PT Goal Formulation: With patient Time For Goal Achievement: 01/04/19 Potential to Achieve Goals: Fair    Frequency Min 3X/week   Barriers to discharge Inaccessible home environment;Decreased caregiver support husband is WC bound (hx of polio), 63yo Granddaughter lives in home.     Co-evaluation               AM-PAC PT "6 Clicks" Mobility  Outcome Measure Help needed turning from your back to your side while in a flat bed without using bedrails?: Total Help needed moving from lying on your back to sitting on the side of a flat bed without using bedrails?: Total Help needed moving to and from a bed to a chair (including a wheelchair)?: Total Help needed standing up from a chair using your arms (e.g., wheelchair or bedside chair)?: Total Help needed to walk in hospital room?: Total Help needed climbing 3-5  steps with a railing? : Total 6 Click Score: 6    End of Session Equipment Utilized During Treatment: Oxygen Activity Tolerance: Patient limited by fatigue;No increased pain;Other (comment)(very high motivation) Patient left: in bed;with nursing/sitter in room;with call bell/phone within reach Nurse Communication: Mobility status PT Visit Diagnosis: Unsteadiness on feet (R26.81);Other abnormalities of gait and mobility (R26.89);Muscle weakness (generalized) (M62.81);Difficulty in walking, not elsewhere classified (R26.2)    Time: 9872-1587 PT Time Calculation (min) (ACUTE ONLY): 22 min   Charges:   PT Evaluation $PT Eval High Complexity: 1 High PT Treatments $Therapeutic Activity: 8-22 mins       3:15 PM, 12/28/18 Etta Grandchild, PT, DPT Physical Therapist - [place] [phone] (Pager)  [phone] (Office)     Jood Retana C 12/28/2018, 3:13 PM

## 2018-12-28 NOTE — Procedures (Signed)
Interventional Radiology Procedure:   Indications: Cellulitis and fluid around PD catheter  Procedure: US guided aspiration of small collection around PD catheter  Findings: Thick small tissue around PD catheter and trace amount of fluid.  Aspirated approximately 1 ml of blood tinged fluid.  Complications: None     EBL: None  Plan: Send fluid for culture.  Will need to give premeds prior to contrast administration and will discuss with nephrology prior to PD catheter injection.    Anushka Hartinger R. Anselm Pancoast, MD  Pager: [phone]

## 2018-12-28 NOTE — Progress Notes (Signed)
[name]    Assessment/ Plan:   53 year old female patient with a history of end-stage renal disease([name])on peritoneal dialysis presents with influenza B.   1. ESRD on PD. Continue current PD Rx. Volume status stable.  Seen on PD 2.5% total UF 1490m/1386/1.2 over past 3 days - Wiill continue current regimen.  - Panniculitis with focal collection along catheter tunnel tract.  - Appreciate Dr. HAnselm PancoastFNA of the peritract collection -> not significant. But given the panniculitis and the fact that she does not want to go on HD even termporarily we need to r/o catheter leak tomorrow after premed for contrast allergy.  - If there is a leak then we need to move the catheter and transition to iHD temporarily.   2. Anemia. Likely due to CKD. Aranesp x1 on 12/22/2017. Eventually will give iron but not while an infection is being treated. 3. Secondary hyperpara. Continue lanthanum for phosphate control 4. Influenza B. Improving.  5. Hypokalemia. Likely secondary to peritoneal dialysis. - Addedoral potassium 469m daily -> will recheck chem panel in AM K4.7 on 1/8  Subjective:   C/o lower abd esp on left side but improving  Cough NP, chills but no fever  Overall feels better today and able to eat.   Objective:   BP 129/73   Pulse 74   Temp 97.6 F (36.4 C) (Oral)   Resp 18   Ht 5' 5" (1.651 m)   Wt (!) 163.6 kg   SpO2 99%   BMI 60.02 kg/m   Intake/Output Summary (Last 24 hours) at 12/28/2018 1137 Last data filed at 12/28/2018 0932 Gross per 24 hour  Intake 536 ml  Output 350 ml  Net 186 ml   Weight change: 0.7 kg  Physical Exam: General: A&Ox3 NAD HEENT: MMM [place] AT anicteric sclera CV: Heart RRR  Lungs: L/S CTA bilaterally Abd: abd SNT/ND with normal BS, obese, LLQ pd catheter, panniculitis and tender esp LLQ Extremities: Bilateral LE lymphedema  Imaging: Ct Abdomen Pelvis Wo Contrast  Result Date:  12/27/2018 CLINICAL DATA:  Worsening abdominal wall cellulitis. EXAM: CT ABDOMEN AND PELVIS WITHOUT CONTRAST TECHNIQUE: Multidetector CT imaging of the abdomen and pelvis was performed following the standard protocol without IV contrast. COMPARISON:  07/14/17 FINDINGS: Lower chest:  Mild atelectasis. Hepatobiliary: No focal liver abnormality.No evidence of biliary obstruction or stone. Pancreas: Unremarkable. Spleen: Unremarkable. Adrenals/Urinary Tract: Negative adrenals. No hydronephrosis or stone. Unremarkable bladder. Stomach/Bowel:  No obstruction. No appendicitis. Vascular/Lymphatic: No acute vascular abnormality. No mass or adenopathy. Reproductive:Hysterectomy Other: Left eccentric abdominal wall skin thickening and subcutaneous reticulation. The wall can not be completely visualized due to patient size. There is a 2.4 cm low-density collection along the patient's left-sided Tenckhoff catheter, just superficial to the muscular wall. No soft tissue emphysema. Trace perihepatic ascites. Musculoskeletal: No acute abnormalities. IMPRESSION: 1. Known left-sided cellulitis/panniculitis. The abdominal wall could not be completely visualized due to patient body habitus. In the region of inflammation is a 2.4 cm deep subcutaneous collection along the Tenckhoff catheter. No soft tissue emphysema. 2. No acute intra-abdominal finding. Electronically Signed   By: JoMonte Fantasia.D.   On: 12/27/2018 05:00    Labs: BMET Recent Labs  Lab 12/21/18 1652 12/22/18 0803 12/23/18 [id number]/20 0550 12/25/18 0728 12/26/18 0717 12/27/18 0710  NA  --  135 135 135 133* 136 132*  K  --  4.1 2.8* 3.1* 3.9 3.2* 4.7  CL  --  97* 94* 96* 97* 97* 95*  CO2  --  _0 GLUCOSE  --  142* 136* 130* 146* 155* 239*  BUN  --  42* 38* 40* 41* 42* 45*  CREATININE 10.48* 10.28* 10.52* 10.98* 10.65* 11.04* 10.97*  CALCIUM  --  8.9 8.6* 8.5* 9.0 8.8* 8.7*  PHOS  --  6.4*  --   --   --   --   --    CBC Recent Labs   Lab 12/22/18 0803 12/23/18 0651 12/24/18 0550 12/25/18 0728 12/27/18 0710  WBC 9.0 8.6 8.9 8.6 11.0*  NEUTROABS 6.4 6.0 6.1 6.1  --   HGB 9.7* 9.7* 9.4* 9.8* 9.5*  HCT 29.1* 30.1* 28.8* 29.8* 30.0*  MCV 99.0 102.0* 101.8* 100.7* 99.7  PLT PLATELET CLUMPS NOTED ON SMEAR, UNABLE TO ESTIMATE 218 203 210 223    Medications:    . amLODipine  5 mg Oral Daily  . cephALEXin  500 mg Oral Q12H  . diphenhydrAMINE  50 mg Oral Once   Or  . diphenhydrAMINE  50 mg Intravenous Once  . diphenhydrAMINE  50 mg Oral Once   Or  . diphenhydrAMINE  50 mg Intravenous Once  . gentamicin cream  1 application Topical Daily  . heparin  5,000 Units Subcutaneous Q8H  . insulin aspart  0-20 Units Subcutaneous TID WC  . insulin detemir  10 Units Subcutaneous Daily  . lanthanum  1,500 mg Oral TID WC  . lidocaine      . multivitamin  1 tablet Oral Daily  . potassium chloride SA  40 mEq Oral Daily  . senna-docusate  1 tablet Oral BID  . sodium chloride flush  3 mL Intravenous Q12H      Otelia Santee, MD 12/28/2018, 11:37 AM

## 2018-12-29 LAB — GLUCOSE, CAPILLARY
Glucose-Capillary: 139 mg/dL — ABNORMAL HIGH (ref 70–99)
Glucose-Capillary: 114 mg/dL — ABNORMAL HIGH (ref 70–99)
Glucose-Capillary: 108 mg/dL — ABNORMAL HIGH (ref 70–99)
Glucose-Capillary: 129 mg/dL — ABNORMAL HIGH (ref 70–99)

## 2018-12-29 MED ORDER — PREDNISONE 50 MG PO TABS
50.0000 mg | ORAL_TABLET | Freq: Four times a day (QID) | ORAL | Status: AC
  Administered 2018-12-31 – 2019-01-01 (×3): 50 mg via ORAL
  Filled 2018-12-29 (×3): qty 1

## 2018-12-29 MED ORDER — ORAL CARE MOUTH RINSE
15.0000 mL | Freq: Two times a day (BID) | OROMUCOSAL | Status: DC
  Administered 2018-12-29 – 2019-01-04 (×7): 15 mL via OROMUCOSAL

## 2018-12-29 MED ORDER — DIPHENHYDRAMINE HCL 25 MG PO CAPS
50.0000 mg | ORAL_CAPSULE | Freq: Once | ORAL | Status: AC
  Administered 2019-01-01: 50 mg via ORAL
  Filled 2018-12-29: qty 2

## 2018-12-29 NOTE — Progress Notes (Signed)
[facility] Progress Note    Assessment/ Plan:   53 year old female patient with a history of end-stage renal disease([name])on peritoneal dialysis presents with influenza B.   1. ESRD on PD. Continue current PD Rx. Volume status stable.  Seen on PD 2.5% total UF 1452m/1386/1.2/1.4 over past 4 days - Wiill continue current regimen.  - Panniculitis with focal collection along catheter tunnel tract.  - Appreciate Dr. HAnselm PancoastFNA of the peritract collection -> not significant. But given the panniculitis and the fact that she does not want to go on HD even termporarily we need to r/o catheter leak today. She has been premed for contrast allergy.  - If there is a leak then we need to move the catheter and transition to iHD temporarily.   2. Anemia. Likely due to CKD. Aranesp x1 on 12/22/2017. Eventually will give iron but not while an infection is being treated. 3. Secondary hyperpara. Continue lanthanum for phosphate control 4. Influenza B. Improving.  5. Hypokalemia. Likely secondary to peritoneal dialysis. - Addedoral potassium 449m daily -> will recheck chem panel in AM K4.7 on 1/8 -> back down to 3.9 on daily 4041mpo. Would continue for now.  Subjective:   C/o lower abdesp on left sidebut improving  Cough NP, chills but no fever  Overall still feels better.    Objective:   BP 122/66 (BP Location: Right Arm)   Pulse 65   Temp 97.6 F (36.4 C)   Resp 18   Ht 5' 5" (1.651 m)   Wt (!) 163.8 kg   SpO2 99%   BMI 60.09 kg/m   Intake/Output Summary (Last 24 hours) at 12/29/2018 0739 Last data filed at 12/29/2018 0601 Gross per 24 hour  Intake 626 ml  Output 0 ml  Net 626 ml   Weight change: 1.8 kg  Physical Exam: General: A&Ox3 NAD CV: Heart RRR  Lungs: L/S CTA bilaterally Abd: abd SNT/ND with normal BS, obese, LLQ pd catheter, panniculitis and tender esp LLQ Extremities: Bilateral LE lymphedema  Imaging: Ir  Us [place] Bx Asp/drain  Result Date: 12/28/2018 INDICATION: [age]ar old with end-stage renal disease and left abdominal peritoneal dialysis catheter. Patient has cellulitis in the left lower abdomen and a small fluid collection around the PD catheter in the deep subcutaneous tissues. Request for a PD catheter injection and aspiration of the peri-catheter fluid. EXAM: ULTRASOUND-GUIDED ASPIRATION OF THE ABDOMINAL SUBCUTANEOUS FLUID COLLECTION MEDICATIONS: None ANESTHESIA/SEDATION: None COMPLICATIONS: None immediate. PROCEDURE: Informed written consent was obtained from the patient after a thorough discussion of the procedural risks, benefits and alternatives. All questions were addressed. Maximal Sterile Barrier Technique was utilized including caps, mask, sterile gowns, sterile gloves, sterile drape, hand hygiene and skin antiseptic. A timeout was performed prior to the initiation of the procedure. Abdomen was evaluated with ultrasound. The PD catheter was easily identified in the subcutaneous tissues. Heterogeneous hypoechoic material around the catheter just superficial to the peritoneal cavity. The overlying skin was prepped with chlorhexidine and sterile field was created. Skin was anesthetized with 1% lidocaine. 18 gauge trocar needle was directed into the pericatheter collection with ultrasound guidance. Approximately 1 mL of serosanguineous fluid was aspirated. A new 18 gauge needle was directed slightly more caudal but only a few additional drops of blood tinged fluid could be obtained. FINDINGS: Complex collection around the peritoneal catheter in the deep subcutaneous tissues. Approximately 1 mL of serosanguineous fluid was removed. Residual hypoechoic material around the catheter may be related to chronic inflammatory process but nonspecific.  IMPRESSION: Ultrasound-guided aspiration of the small complex collection around the peritoneal dialysis catheter in the abdominal subcutaneous tissues. Small amount  of fluid was obtained and sent for culture. Patient will receive premedications for contrast allergy prior to injection of the PD catheter. Electronically Signed   By: Markus Daft M.D.   On: 12/28/2018 13:06    Labs: BMET Recent Labs  Lab 12/22/18 0803 12/23/18 6073 12/24/18 0550 12/25/18 0728 12/26/18 0717 12/27/18 0710 12/28/18 1151  NA 135 135 135 133* 136 132* 133*  K 4.1 2.8* 3.1* 3.9 3.2* 4.7 3.9  CL 97* 94* 96* 97* 97* 95* 95*  CO2 _0 GLUCOSE 142* 136* 130* 146* 155* 239* 193*  BUN 42* 38* 40* 41* 42* 45* 54*  CREATININE 10.28* 10.52* 10.98* 10.65* 11.04* 10.97* 11.09*  CALCIUM 8.9 8.6* 8.5* 9.0 8.8* 8.7* 8.8*  PHOS 6.4*  --   --   --   --   --   --    CBC Recent Labs  Lab 12/22/18 0803 12/23/18 0651 12/24/18 0550 12/25/18 0728 12/27/18 0710  WBC 9.0 8.6 8.9 8.6 11.0*  NEUTROABS 6.4 6.0 6.1 6.1  --   HGB 9.7* 9.7* 9.4* 9.8* 9.5*  HCT 29.1* 30.1* 28.8* 29.8* 30.0*  MCV 99.0 102.0* 101.8* 100.7* 99.7  PLT PLATELET CLUMPS NOTED ON SMEAR, UNABLE TO ESTIMATE 218 203 210 223    Medications:    . amLODipine  5 mg Oral Daily  . cephALEXin  500 mg Oral Q12H  . diphenhydrAMINE  50 mg Oral Once   Or  . diphenhydrAMINE  50 mg Intravenous Once  . diphenhydrAMINE  50 mg Oral Once   Or  . diphenhydrAMINE  50 mg Intravenous Once  . gentamicin cream  1 application Topical Daily  . heparin  5,000 Units Subcutaneous Q8H  . insulin aspart  0-20 Units Subcutaneous TID WC  . insulin detemir  10 Units Subcutaneous Daily  . lanthanum  1,500 mg Oral TID WC  . multivitamin  1 tablet Oral Daily  . potassium chloride SA  40 mEq Oral Daily  . senna-docusate  1 tablet Oral BID  . sodium chloride flush  3 mL Intravenous Q12H      Otelia Santee, MD 12/29/2018, 7:39 AM

## 2018-12-29 NOTE — Progress Notes (Signed)
Occupational Therapy Treatment Patient Details Name: [name] MRN: [id number] DOB: Oct 08, 1966 Today's Date: 12/29/2018    History of present illness [name] is a 53 y.o. female with ESRD on PD presented to [facility] ED by EMS with complaints of 10/10 pain in bilateral legs and abdomen.  She feels like she might have a recurrence of cellulitis in her legs which she has had multiple times in the past. She reports that she had dialysis at home last night.  She reports that her legs have increased in size recently.  She has chronic lymphedema in both legs which is severe.  She reports abdominal pain in LLQ below her PD cath site.  She reports intermittent fever at home but no chills.  She denies nausea and vomiting.  She has been concerned about blood clots in legs as she is very immobile.  She tested positive for flu B and was placed on Tamiflu.  There was concern for peritonitis, patient underwent paracentesis which was negative for peritonitis   OT comments  Pt making good progress towards acute OT goals. Able to complete supine>EOB with only min guard assist, mod A for EOB>supine with assist needed to advance BLE up onto the bed. Pt also stood 3x from EOB with rw and min guard assist. Pt stood 25-10 seconds each interval with brief (~1 min or less) seated rest break between intervals. Pt reporting pain in abdomen and BLE but very motivated to work with therapy and regain PLOF. Updating d/c recommendation to CIR.   Follow Up Recommendations  CIR    Equipment Recommendations  Other (comment)(defer to next venue)    Recommendations for Other Services Rehab consult    Precautions / Restrictions Precautions Precautions: Fall Restrictions Weight Bearing Restrictions: No       Mobility Bed Mobility Overal bed mobility: Needs Assistance Bed Mobility: Supine to Sit;Sit to Supine     Supine to sit: Supervision Sit to supine: Mod assist   General bed mobility comments: Pt able to  supine>EOB at supervision level. Assist to advance BLE back up on to bed at end of session (she reports family does this at home). Pt able to scoot up in bed with HOB flat, extra time and effort needed.  Transfers Overall transfer level: Needs assistance Equipment used: Rolling walker (2 wheeled) Transfers: Sit to/from Stand Sit to Stand: Min guard         General transfer comment: Pt completed 3x from EOB slightly elevated to match height of bed at home. Min guard each time with extra time and effort needed. Pt standing about 25 seconds on first attempt, 15 then 10 on last trial. increased pain with movement but pt very motivated. Pt reports she is still experiencing some soreness from physician's abdominal exam this morning.     Balance Overall balance assessment: Needs assistance         Standing balance support: Bilateral upper extremity supported;During functional activity Standing balance-Leahy Scale: Poor Standing balance comment: relies on rw during static standing                           ADL either performed or assessed with clinical judgement   ADL Overall ADL's : Needs assistance/impaired             Lower Body Bathing: Maximal assistance;Sit to/from stand Lower Body Bathing Details (indicate cue type and reason): pt able to complete 3x sit<>stands at min guard level, standing  about 25-10 seconds each time. Brief seated rest breaks between intervals. Pt would need max-total assist for task of bathing LB as she relies on constant BUE support of rw.     Lower Body Dressing: Maximal assistance;Sit to/from stand Lower Body Dressing Details (indicate cue type and reason): pt able to complete 3x sit<>stands and standing about 25-10 seconds each time. Pt would need max-total assist for task of donning/doffing LB garments as she relies on constant BUE support of rw.               General ADL Comments: Pt completed bed mobility, able to sit EOB at  supervision level, stood with rw 3x. LB ADLs above based on clinical judgement     Vision       Perception     Praxis      Cognition Arousal/Alertness: Awake/alert Behavior During Therapy: WFL for tasks assessed/performed Overall Cognitive Status: Within Functional Limits for tasks assessed                                          Exercises     Shoulder Instructions       General Comments      Pertinent Vitals/ Pain       Pain Assessment: Faces Faces Pain Scale: Hurts even more Pain Location: abdomen and BLE Pain Descriptors / Indicators: Aching;Discomfort;Sore Pain Intervention(s): Limited activity within patient's tolerance;Monitored during session;Repositioned  Home Living                                          Prior Functioning/Environment              Frequency  Min 3X/week        Progress Toward Goals  OT Goals(current goals can now be found in the care plan section)  Progress towards OT goals: Progressing toward goals  Acute Rehab OT Goals Patient Stated Goal: restore basic mobility to PLOF  OT Goal Formulation: With patient Time For Goal Achievement: 01/08/19 Potential to Achieve Goals: Good ADL Goals Pt Will Perform Lower Body Bathing: with min assist;sit to/from stand;sitting/lateral leans;with adaptive equipment Pt Will Perform Lower Body Dressing: with min assist;sit to/from stand;sitting/lateral leans;with adaptive equipment Pt Will Transfer to Toilet: with min assist;bedside commode Pt Will Perform Toileting - Clothing Manipulation and hygiene: with supervision Pt Will Perform Tub/Shower Transfer: with min assist;shower seat Additional ADL Goal #1: Pt will perform bed mobility at min A level in preparation for BADL activity  Plan Discharge plan needs to be updated;Frequency needs to be updated    Co-evaluation                 AM-PAC OT "6 Clicks" Daily Activity     Outcome Measure    Help from another person eating meals?: None Help from another person taking care of personal grooming?: None(seated) Help from another person toileting, which includes using toliet, bedpan, or urinal?: A Lot Help from another person bathing (including washing, rinsing, drying)?: A Lot Help from another person to put on and taking off regular upper body clothing?: None Help from another person to put on and taking off regular lower body clothing?: A Lot 6 Click Score: 18    End of Session Equipment Utilized During Treatment: Rolling walker;Oxygen  OT  Visit Diagnosis: Other abnormalities of gait and mobility (R26.89);Muscle weakness (generalized) (M62.81);History of falling (Z91.81)   Activity Tolerance Patient limited by pain;Patient limited by fatigue   Patient Left in bed;with call bell/phone within reach;with nursing/sitter in room   Nurse Communication Mobility status;Other (comment)(would benefit from bariatric drop arm recliner)        Time: 8875-7972 OT Time Calculation (min): 24 min  Charges: OT General Charges $OT Visit: 1 Visit OT Treatments $Self Care/Home Management : 23-37 mins  Tyrone Schimke, OT Acute Rehabilitation Services Pager: [phone] Office: [phone]    Hortencia Pilar 12/29/2018, 12:02 PM

## 2018-12-29 NOTE — Progress Notes (Signed)
PROGRESS NOTE    [name]  XHB:[id number] DOB: 05/15/1966 DOA: 12/21/2018 PCP: [name], MD     Brief Narrative:  [name] is a 53 y.o. female with ESRD on PD presented to ED by EMS with complaints of 10/10 pain in bilateral legs and abdomen.  She feels like she might have a recurrence of cellulitis in her legs which she has had multiple times in the past. She reports that she had dialysis at home last night.  She reports that her legs have increased in size recently.  She has chronic lymphedema in both legs which is severe.  She reports abdominal pain in LLQ below her PD cath site.  She reports intermittent fever at home but no chills.  She denies nausea and vomiting.  She has been concerned about blood clots in legs as she is very immobile.  She tested positive for flu B and was placed on Tamiflu.  There was concern for peritonitis, patient underwent paracentesis which was negative for peritonitis.  Nephrology was consulted.  She was started on Augmentin for left lower quadrant abdominal wall cellulitis.  CT abdomen showed fluid collection near PD catheter.  IR consulted and minimal/1 mL blood-tinged fluid aspirated.  New events last 24 hours / Subjective: Patient states that her abdominal pain continues to improve.  Denies any other complaints.  States that she was on dialysis for 3 years until she went on PD 6 months ago.  Not keen to return on HD due to spouse with disability and issues with transportation.  Assessment & Plan:   Principal Problem:   Influenza B Active Problems:   Lymphedema of lower extremity   Diabetes mellitus (HCC)   Morbid obesity (HCC)   HTN (hypertension)   Chronic diastolic CHF (congestive heart failure) (HCC)   ESRD on dialysis (HCC)   ESRD (end stage renal disease) (HCC)   Cor pulmonale, chronic (HCC)   Hypokalemia   Peritonitis (HCC)   Abdominal pain, LLQ   Influenza B Tamiflu received 63m dose, which is full 5 day course for pt  on PD. Discussed with pharmacist. Droplet precaution.  Flu symptoms have resolved.  Nonpurulent cellulitis/panniculitis LLQ abdominal wall/subcutaneous collection on CT Initially started on Augmentin but was not improving.  Hence CT abdomen without contrast was obtained on 1/7 which showed 2.4 cm deep subcutaneous collection along the PD catheter site. IR was only able to aspirate 1 mL fluid, culture negative to date. Plan is for patient to rule out PD catheter leak by premeds followed by contrasted study >this was supposed to be done today but now postponed to 1/13. Augmentin was changed to Keflex couple days ago.  Clinically improving.  Constipation Bowel regimen ordered, suppository prn   Bilateral leg pain/chronic lymphedema DVT ultrasound was negative  ESRD on peritoneal dialysis Nephrology consulted and undergoing PD under their direction.  PD catheter evaluation as indicated above.  Patient does not wish to go on HD even temporarily but if catheter does show a leak, she will have to.  [place]  Type 2 diabetes Levemir, sliding-scale insulin.  Good control today.  No changes.  Essential hypertension Continue Norvasc.  Controlled.  Morbid obesity Body mass index is 59.29 kg/m.   Anxiety Continue Xanax   DVT prophylaxis: Subq hep Code Status: Full Family Communication:  No family at bedside Disposition Plan: Pending further improvement in abdominal wall cellulitis    Consultants:   Nephrology  Interventional radiology  Procedures:   PD  Aspiration of abdominal wall collection by IR on 1/9.  Antimicrobials:  Anti-infectives (From admission, onward)   Start     Dose/Rate Route Frequency Ordered Stop   12/27/18 2200  cephALEXin (KEFLEX) capsule 500 mg     500 mg Oral Every 12 hours 12/27/18 1846     12/23/18 1200  amoxicillin-clavulanate (AUGMENTIN) 500-125 MG per tablet 500 mg  Status:  Discontinued     1 tablet  Oral Every 24 hours 12/23/18 1022 12/27/18 1846   12/23/18 1015  amoxicillin-clavulanate (AUGMENTIN) 875-125 MG per tablet 1 tablet  Status:  Discontinued     1 tablet Oral Every 12 hours 12/23/18 1014 12/23/18 1021   12/22/18 1000  oseltamivir (TAMIFLU) capsule 30 mg  Status:  Discontinued     30 mg Oral Daily 12/21/18 1631 12/21/18 1738   12/21/18 1700  cefTAZidime (FORTAZ) 2 g in sodium chloride 0.9 % 100 mL IVPB  Status:  Discontinued     2 g 200 mL/hr over 30 Minutes Intravenous  Once 12/21/18 1655 12/21/18 2016   12/21/18 1700  ceFAZolin (ANCEF) IVPB 1 g/50 mL premix  Status:  Discontinued     1 g 100 mL/hr over 30 Minutes Intravenous  Once 12/21/18 1655 12/21/18 2016   12/21/18 1215  oseltamivir (TAMIFLU) capsule 30 mg     30 mg Oral  Once 12/21/18 1209 12/21/18 1307       Objective: Vitals:   12/29/18 0458 12/29/18 0922 12/29/18 1205 12/29/18 1635  BP: 122/66 (!) 107/49 125/62 (!) 119/53  Pulse: 65 71 79 73  Resp: _0 Temp: 97.6 F (36.4 C) 97.9 F (36.6 C) 98.3 F (36.8 C) 97.7 F (36.5 C)  TempSrc:  Oral Oral Oral  SpO2: 99% 98% 100% 100%  Weight:   (!) 161.6 kg   Height:        Intake/Output Summary (Last 24 hours) at 12/29/2018 1823 Last data filed at 12/29/2018 1359 Gross per 24 hour  Intake 483 ml  Output 0 ml  Net 483 ml   Filed Weights   12/28/18 1920 12/28/18 2151 12/29/18 1205  Weight: (!) 163.8 kg (!) 163.8 kg (!) 161.6 kg    Examination: General exam: Pleasant young female, moderately built and morbidly obese, lying comfortably propped up in bed. Respiratory system: Clear to auscultation.  No increased work of breathing. Cardiovascular system: S1 & S2 heard, RRR. No JVD, murmurs, rubs, gallops or clicks. No pedal edema.  Not on telemetry.  Stable. Gastrointestinal system: Abdomen is obese.  Left-sided PD catheter site intact.  Difficult exam due to body habitus.  Has significant induration of lower abdominal/panniculus but no erythema,  redness or drainage.  Some tenderness. Central nervous system: Alert and oriented. No focal neurological deficits. Extremities: Symmetric 5 x 5 power.  Changes of chronic severe bilateral lower extremity lymphedema without acute findings. Psychiatry: Judgement and insight appear normal. Mood & affect appropriate.    Data Reviewed: I have personally reviewed following labs and imaging studies  CBC: Recent Labs  Lab 12/23/18 0651 12/24/18 0550 12/25/18 0728 12/27/18 0710  WBC 8.6 8.9 8.6 11.0*  NEUTROABS 6.0 6.1 6.1  --   HGB 9.7* 9.4* 9.8* 9.5*  HCT 30.1* 28.8* 29.8* 30.0*  MCV 102.0* 101.8* 100.7* 99.7  PLT 218 203 210 567   Basic Metabolic Panel: Recent Labs  Lab 12/24/18 0550 12/25/18 0728 12/26/18 0717 12/27/18 0710 12/28/18 1151  NA 135 133* 136 132* 133*  K 3.1* 3.9 3.2* 4.7 3.9  CL 96* 97* 97* 95* 95*  CO2 _0 GLUCOSE 130* 146* 155* 239* 193*  BUN 40* 41* 42* 45* 54*  CREATININE 10.98* 10.65* 11.04* 10.97* 11.09*  CALCIUM 8.5* 9.0 8.8* 8.7* 8.8*   GFR: Estimated Creatinine Clearance: 9.3 mL/min (A) (by C-G formula based on SCr of 11.09 mg/dL (H)). Liver Function Tests: Recent Labs  Lab 12/27/18 0710  AST 13*  ALT 15  ALKPHOS 91  BILITOT 0.7  PROT 6.6  ALBUMIN 2.4*   CBG: Recent Labs  Lab 12/28/18 1714 12/28/18 2150 12/29/18 0746 12/29/18 1136 12/29/18 1634  GLUCAP 146* 142* 139* 114* 108*   Anemia Panel: Recent Labs    12/27/18 0710  FERRITIN 1,085*  TIBC 174*  IRON 35    Recent Results (from the past 240 hour(s))  Culture, blood (routine x 2)     Status: None   Collection Time: 12/21/18 11:06 AM  Result Value Ref Range Status   Specimen Description BLOOD BLOOD RIGHT WRIST  Final   Special Requests   Final    BOTTLES DRAWN AEROBIC ONLY Blood Culture results may not be optimal due to an inadequate volume of blood received in culture bottles   Culture   Final    NO GROWTH 5 DAYS Performed at [facility], [address], [place], [place] [address]    Report Status 12/26/2018 FINAL  Final  MRSA PCR Screening     Status: None   Collection Time: 12/21/18  6:14 PM  Result Value Ref Range Status   MRSA by PCR NEGATIVE NEGATIVE Final    Comment:        The GeneXpert MRSA Assay (FDA approved for NASAL specimens only), is one component of a comprehensive MRSA colonization surveillance program. It is not intended to diagnose MRSA infection nor to guide or monitor treatment for MRSA infections. Performed at [facility], [place] [address], [place], [place] [address]   Culture, body fluid-bottle     Status: None   Collection Time: 12/21/18  8:18 PM  Result Value Ref Range Status   Specimen Description FLUID PERITONEAL DIALYSATE  Final   Special Requests BOTTLES DRAWN AEROBIC AND ANAEROBIC  Final   Culture   Final    NO GROWTH 5 DAYS Performed at [facility], [place] [address], [place], [place] [address]    Report Status 12/26/2018 FINAL  Final  Gram stain     Status: None   Collection Time: 12/21/18  8:18 PM  Result Value Ref Range Status   Specimen Description FLUID PERITONEAL DIALYSATE  Final   Special Requests NONE  Final   Gram Stain   Final    WBC PRESENT,BOTH PMN AND MONONUCLEAR NO ORGANISMS SEEN CYTOSPIN SMEAR Performed at [facility], [address], [place], [place] [address]    Report Status 12/21/2018 FINAL  Final  Aerobic/Anaerobic Culture (surgical/deep wound)     Status: None (Preliminary result)   Collection Time: 12/28/18 10:53 AM  Result Value Ref Range Status   Specimen Description FLUID  Final   Special Requests NONE  Final   Gram Stain   Final    RARE WBC PRESENT, PREDOMINANTLY MONONUCLEAR NO ORGANISMS SEEN    Culture   Final    NO GROWTH 1 DAY Performed at [facility], [place] [address] [place], [place] [address]    Report Status PENDING  Incomplete  Radiology Studies: Ir US Guide Bx Asp/drain  Result Date: 12/28/2018 INDICATION:  53 year old with end-stage renal disease and left abdominal peritoneal dialysis catheter. Patient has cellulitis in the left lower abdomen and a small fluid collection around the PD catheter in the deep subcutaneous tissues. Request for a PD catheter injection and aspiration of the peri-catheter fluid. EXAM: ULTRASOUND-GUIDED ASPIRATION OF THE ABDOMINAL SUBCUTANEOUS FLUID COLLECTION MEDICATIONS: None ANESTHESIA/SEDATION: None COMPLICATIONS: None immediate. PROCEDURE: Informed written consent was obtained from the patient after a thorough discussion of the procedural risks, benefits and alternatives. All questions were addressed. Maximal Sterile Barrier Technique was utilized including caps, mask, sterile gowns, sterile gloves, sterile drape, hand hygiene and skin antiseptic. A timeout was performed prior to the initiation of the procedure. Abdomen was evaluated with ultrasound. The PD catheter was easily identified in the subcutaneous tissues. Heterogeneous hypoechoic material around the catheter just superficial to the peritoneal cavity. The overlying skin was prepped with chlorhexidine and sterile field was created. Skin was anesthetized with 1% lidocaine. 18 gauge trocar needle was directed into the pericatheter collection with ultrasound guidance. Approximately 1 mL of serosanguineous fluid was aspirated. A new 18 gauge needle was directed slightly more caudal but only a few additional drops of blood tinged fluid could be obtained. FINDINGS: Complex collection around the peritoneal catheter in the deep subcutaneous tissues. Approximately 1 mL of serosanguineous fluid was removed. Residual hypoechoic material around the catheter may be related to chronic inflammatory process but nonspecific. IMPRESSION: Ultrasound-guided aspiration of the small complex collection around the peritoneal dialysis catheter in the abdominal subcutaneous tissues. Small amount of fluid was obtained and sent for culture. Patient will  receive premedications for contrast allergy prior to injection of the PD catheter. Electronically Signed   By: Markus Daft M.D.   On: 12/28/2018 13:06      Scheduled Meds: . amLODipine  5 mg Oral Daily  . cephALEXin  500 mg Oral Q12H  . [START ON 01/01/2019] diphenhydrAMINE  50 mg Oral Once  . gentamicin cream  1 application Topical Daily  . heparin  5,000 Units Subcutaneous Q8H  . insulin aspart  0-20 Units Subcutaneous TID WC  . insulin detemir  10 Units Subcutaneous Daily  . lanthanum  1,500 mg Oral TID WC  . multivitamin  1 tablet Oral Daily  . potassium chloride SA  40 mEq Oral Daily  . [START ON 12/31/2018] predniSONE  50 mg Oral Q6H  . senna-docusate  1 tablet Oral BID  . sodium chloride flush  3 mL Intravenous Q12H   Continuous Infusions: . sodium chloride    . dialysis solution 2.5% low-MG/low-CA    . dialysis solution 2.5% low-MG/low-CA    . dialysis solution 2.5% low-MG/low-CA       LOS: 8 days    Vernell Leep, MD, FACP, [facility]. [facility] Pager [phone]  If 7PM-7AM, please contact night-coverage [email or web address] Password [facility] 12/29/2018, 6:23 PM

## 2018-12-29 NOTE — Progress Notes (Signed)
Patient ID: [name], female   DOB: 10/31/1966, 53 y.o.   MRN: [id number]   Pt rescheduled to [facility] for peritoneal dialysis catheter injection in IR  Must be pre medicated for procedure per IR Rad Has anaphylaxis reaction to contrast  Orders in for [facility] 01/01/19 9 am procedure

## 2018-12-30 ENCOUNTER — Inpatient Hospital Stay (HOSPITAL_COMMUNITY): Payer: Medicare Other

## 2018-12-30 LAB — GLUCOSE, CAPILLARY
Glucose-Capillary: 122 mg/dL — ABNORMAL HIGH (ref 70–99)
Glucose-Capillary: 96 mg/dL (ref 70–99)
Glucose-Capillary: 91 mg/dL (ref 70–99)
Glucose-Capillary: 94 mg/dL (ref 70–99)

## 2018-12-30 LAB — PHOSPHORUS: Phosphorus: 5.6 mg/dL — ABNORMAL HIGH (ref 2.5–4.6)

## 2018-12-30 MED ORDER — ALTEPLASE 2 MG IJ SOLR
4.0000 mg | Freq: Once | INTRAMUSCULAR | Status: AC
  Administered 2018-12-30: 4 mg
  Filled 2018-12-30 (×2): qty 4

## 2018-12-30 MED ORDER — ALTEPLASE 2 MG IJ SOLR
INTRAMUSCULAR | Status: AC
  Filled 2018-12-30: qty 4

## 2018-12-30 MED ORDER — HEPARIN 1000 UNIT/ML FOR PERITONEAL DIALYSIS
INTRAPERITONEAL | Status: DC | PRN
  Filled 2018-12-30: qty 5000

## 2018-12-30 NOTE — Progress Notes (Signed)
PD nurse called w/ failure to drain , only 1 L out today.  Have ordered xray to look at cath position and r/o constipation and also ordered TPA to catheter (9m) to see if can help w/ the drainage.  Will add heparin to PD fluids tonight as well.   RKelly SplinterMD [name] 12/30/2018, 6:45 PM

## 2018-12-30 NOTE — Progress Notes (Addendum)
[facility] Progress Note    Assessment/ Plan:   53 year old female patient with a history of end-stage renal disease([place])on peritoneal dialysis presents with influenza B.   1. ESRD on PD. Continue current PD Rx. Volume status stable.  Seen on PD 2.5% total UF 1469m/1386/1.2/1.4/1.3 over 5 days.  - Wiill continue current regimen.  - Panniculitis with focal collection along catheter tunnel tract. - Appreciate Dr. HPamalee Leydenthe peritract collection -> not significant. But given the panniculitis and the fact that she does not want to go on HD even termporarily we need to r/o catheter leak. She will be premedicated  for contrast allergy in anticipation of pd gram on Monday.  - If there is a leak then we need to move the catheter and transition to iHD temporarily.  - This is not the classical appearance of calciphylaxis but I'm wondering if it's worthwhile getting a biopsy to r/o. Biopsy is not without risk.  2. Anemia. Likely due to CKD. Aranesp x1 on 12/22/2017. Eventually will give iron but not while an infection is being treated. 3. Secondary hyperpara. Continue lanthanum for phosphate control. Recheck phos tomorrow. 4. Influenza B. Improving.  5. Hypokalemia. Likely secondary to peritoneal dialysis. - Addedoral potassium 475m daily-> will recheck chem panel in AM K4.7 on 1/8 -> back down to 3.9 on daily 4030mpo. Would continue for now.  Subjective:   C/o lower abdesp onleftsidebut improving  Cough NP, chills but no fever  Overall still feels better and tolerating PO's.   Objective:   BP 130/68 (BP Location: Right Arm)   Pulse 70   Temp 97.9 F (36.6 C) (Oral)   Resp 18   Ht _0  (1.651 m)   Wt (!) 160.9 kg   SpO2 100%   BMI 59.03 kg/m   Intake/Output Summary (Last 24 hours) at 12/30/2018 0854 Last data filed at 12/30/2018 0200 Gross per 24 hour  Intake 463 ml  Output 0 ml  Net 463 ml   Weight change:  -2.2 kg  Physical Exam: General: A&Ox3 NAD CV: Heart RRR  Lungs: L/S CTA bilaterally Abd: abd SNT/ND with normal BS, obese, LLQ pd catheter, panniculitis and tender esp LLQ Extremities: Bilateral LE lymphedema  Imaging: Ir Us [place] Bx Asp/drain  Result Date: 12/28/2018 INDICATION: 52 48ar old with end-stage renal disease and left abdominal peritoneal dialysis catheter. Patient has cellulitis in the left lower abdomen and a small fluid collection around the PD catheter in the deep subcutaneous tissues. Request for a PD catheter injection and aspiration of the peri-catheter fluid. EXAM: ULTRASOUND-GUIDED ASPIRATION OF THE ABDOMINAL SUBCUTANEOUS FLUID COLLECTION MEDICATIONS: None ANESTHESIA/SEDATION: None COMPLICATIONS: None immediate. PROCEDURE: Informed written consent was obtained from the patient after a thorough discussion of the procedural risks, benefits and alternatives. All questions were addressed. Maximal Sterile Barrier Technique was utilized including caps, mask, sterile gowns, sterile gloves, sterile drape, hand hygiene and skin antiseptic. A timeout was performed prior to the initiation of the procedure. Abdomen was evaluated with ultrasound. The PD catheter was easily identified in the subcutaneous tissues. Heterogeneous hypoechoic material around the catheter just superficial to the peritoneal cavity. The overlying skin was prepped with chlorhexidine and sterile field was created. Skin was anesthetized with 1% lidocaine. 18 gauge trocar needle was directed into the pericatheter collection with ultrasound guidance. Approximately 1 mL of serosanguineous fluid was aspirated. A new 18 gauge needle was directed slightly more caudal but only a few additional drops of blood tinged fluid could be obtained. FINDINGS: Complex collection  around the peritoneal catheter in the deep subcutaneous tissues. Approximately 1 mL of serosanguineous fluid was removed. Residual hypoechoic material around the  catheter may be related to chronic inflammatory process but nonspecific. IMPRESSION: Ultrasound-guided aspiration of the small complex collection around the peritoneal dialysis catheter in the abdominal subcutaneous tissues. Small amount of fluid was obtained and sent for culture. Patient will receive premedications for contrast allergy prior to injection of the PD catheter. Electronically Signed   By: Markus Daft M.D.   On: 12/28/2018 13:06    Labs: BMET Recent Labs  Lab 12/24/18 0550 12/25/18 0728 12/26/18 0717 12/27/18 0710 12/28/18 1151  NA 135 133* 136 132* 133*  K 3.1* 3.9 3.2* 4.7 3.9  CL 96* 97* 97* 95* 95*  CO2 _0 GLUCOSE 130* 146* 155* 239* 193*  BUN 40* 41* 42* 45* 54*  CREATININE 10.98* 10.65* 11.04* 10.97* 11.09*  CALCIUM 8.5* 9.0 8.8* 8.7* 8.8*   CBC Recent Labs  Lab 12/24/18 0550 12/25/18 0728 12/27/18 0710  WBC 8.9 8.6 11.0*  NEUTROABS 6.1 6.1  --   HGB 9.4* 9.8* 9.5*  HCT 28.8* 29.8* 30.0*  MCV 101.8* 100.7* 99.7  PLT 203 210 223    Medications:    . amLODipine  5 mg Oral Daily  . cephALEXin  500 mg Oral Q12H  . [START ON 01/01/2019] diphenhydrAMINE  50 mg Oral Once  . gentamicin cream  1 application Topical Daily  . heparin  5,000 Units Subcutaneous Q8H  . insulin aspart  0-20 Units Subcutaneous TID WC  . insulin detemir  10 Units Subcutaneous Daily  . lanthanum  1,500 mg Oral TID WC  . mouth rinse  15 mL Mouth Rinse BID  . multivitamin  1 tablet Oral Daily  . potassium chloride SA  40 mEq Oral Daily  . [START ON 12/31/2018] predniSONE  50 mg Oral Q6H  . senna-docusate  1 tablet Oral BID  . sodium chloride flush  3 mL Intravenous Q12H      Otelia Santee, MD 12/30/2018, 8:54 AM

## 2018-12-30 NOTE — Progress Notes (Signed)
PROGRESS NOTE    [name]  MIW:[id number] DOB: 1966/09/19 DOA: 12/21/2018 PCP: [name], MD     Brief Narrative:  [name] is a 53 y.o. female with ESRD on PD presented to ED by EMS with complaints of 10/10 pain in bilateral legs and abdomen.  She feels like she might have a recurrence of cellulitis in her legs which she has had multiple times in the past. She reports that she had dialysis at home last night.  She reports that her legs have increased in size recently.  She has chronic lymphedema in both legs which is severe.  She reports abdominal pain in LLQ below her PD cath site.  She reports intermittent fever at home but no chills.  She denies nausea and vomiting.  She has been concerned about blood clots in legs as she is very immobile.  She tested positive for flu B and was placed on Tamiflu.  There was concern for peritonitis, patient underwent paracentesis which was negative for peritonitis.  Nephrology was consulted.  She was started on Augmentin for left lower quadrant abdominal wall cellulitis.  CT abdomen showed fluid collection near PD catheter.  IR consulted and minimal/1 mL blood-tinged fluid aspirated.  New events last 24 hours / Subjective: Indicates progressive improvement of her abdominal wall pain but as per nephrology, still with significant induration and tenderness on exam.  Assessment & Plan:   Principal Problem:   Influenza B Active Problems:   Lymphedema of lower extremity   Diabetes mellitus ([place])   Morbid obesity (HCC)   HTN (hypertension)   Chronic diastolic CHF (congestive heart failure) (HCC)   ESRD on dialysis (HCC)   ESRD (end stage renal disease) (HCC)   Cor pulmonale, chronic (HCC)   Hypokalemia   Peritonitis (HCC)   Abdominal pain, LLQ   Influenza B Tamiflu received 76m dose, which is full 5 day course for pt on PD. Discussed with pharmacist. Droplet precaution.  Flu symptoms have resolved.  Nonpurulent  cellulitis/panniculitis LLQ abdominal wall/subcutaneous collection on CT Initially started on Augmentin but was not improving.  Hence CT abdomen without contrast was obtained on 1/7 which showed 2.4 cm deep subcutaneous collection along the PD catheter site. IR was only able to aspirate 1 mL fluid, culture negative to date. Plan is for patient to rule out PD catheter leak by premeds followed by contrasted study >this was supposed to be done today but now postponed to 1/13. Augmentin was changed to Keflex couple days ago.  Clinically improving. As discussed with Dr. LAugustin Coupe nephrology on 1/11, concerned that area of suspected cellulitis is not improving fast enough, concern for calciphylaxis and suggest deep biopsy.  Will discuss with [facility].  Constipation Bowel regimen ordered, suppository prn.  Had BM today.  Bilateral leg pain/chronic lymphedema DVT ultrasound was negative  ESRD on peritoneal dialysis Nephrology consulted and undergoing PD under their direction.  PD catheter evaluation as indicated above.  Patient does not wish to go on HD even temporarily but if catheter does show a leak, she will have to.  She [place] with Dr. [place]owanda Foster [place]  Type 2 diabetes Levemir, sliding-scale insulin.  Good control today.  No changes.  Essential hypertension Continue Norvasc.  Controlled.  Morbid obesity Body mass index is 58.7 kg/m.   Anxiety Continue Xanax   DVT prophylaxis: Subq hep Code Status: Full Family Communication:  No family at bedside Disposition Plan: Pending further improvement in abdominal wall cellulitis    Consultants:  Nephrology  Interventional radiology  Procedures:   PD  Aspiration of abdominal wall collection by IR on 1/9.  Antimicrobials:  Anti-infectives (From admission, onward)   Start     Dose/Rate Route Frequency Ordered Stop   12/27/18 2200  cephALEXin (KEFLEX) capsule 500 mg     500 mg Oral Every 12 hours 12/27/18 1846      12/23/18 1200  amoxicillin-clavulanate (AUGMENTIN) 500-125 MG per tablet 500 mg  Status:  Discontinued     1 tablet Oral Every 24 hours 12/23/18 1022 12/27/18 1846   12/23/18 1015  amoxicillin-clavulanate (AUGMENTIN) 875-125 MG per tablet 1 tablet  Status:  Discontinued     1 tablet Oral Every 12 hours 12/23/18 1014 12/23/18 1021   12/22/18 1000  oseltamivir (TAMIFLU) capsule 30 mg  Status:  Discontinued     30 mg Oral Daily 12/21/18 1631 12/21/18 1738   12/21/18 1700  cefTAZidime (FORTAZ) 2 g in sodium chloride 0.9 % 100 mL IVPB  Status:  Discontinued     2 g 200 mL/hr over 30 Minutes Intravenous  Once 12/21/18 1655 12/21/18 2016   12/21/18 1700  ceFAZolin (ANCEF) IVPB 1 g/50 mL premix  Status:  Discontinued     1 g 100 mL/hr over 30 Minutes Intravenous  Once 12/21/18 1655 12/21/18 2016   12/21/18 1215  oseltamivir (TAMIFLU) capsule 30 mg     30 mg Oral  Once 12/21/18 1209 12/21/18 1307       Objective: Vitals:   12/30/18 0239 12/30/18 0611 12/30/18 0947 12/30/18 1030  BP: 130/68  (!) 122/53 115/63  Pulse: 70  72 72  Resp: _0 Temp: 97.9 F (36.6 C)  97.7 F (36.5 C) 98.4 F (36.9 C)  TempSrc: Oral  Oral Oral  SpO2: 100%  100% 100%  Weight:  (!) 160.9 kg  (!) 160 kg  Height:        Intake/Output Summary (Last 24 hours) at 12/30/2018 1538 Last data filed at 12/30/2018 0900 Gross per 24 hour  Intake 460 ml  Output 0 ml  Net 460 ml   Filed Weights   12/29/18 1800 12/30/18 0611 12/30/18 1030  Weight: (!) 162.3 kg (!) 160.9 kg (!) 160 kg    Examination: General exam: Pleasant young female, moderately built and morbidly obese, lying comfortably propped up in bed. Respiratory system: Clear to auscultation.  No increased work of breathing. Cardiovascular system: S1 & S2 heard, RRR. No JVD, murmurs, rubs, gallops or clicks. No pedal edema.  Stable. Gastrointestinal system: Abdomen is obese.  Left-sided PD catheter site intact.  Difficult exam due to body habitus.  Has  significant induration of lower abdominal/panniculus but no erythema, redness or drainage.  Some tenderness.  Not much change. Central nervous system: Alert and oriented. No focal neurological deficits. Extremities: Symmetric 5 x 5 power.  Changes of chronic severe bilateral lower extremity lymphedema without acute findings. Psychiatry: Judgement and insight appear normal. Mood & affect appropriate.    Data Reviewed: I have personally reviewed following labs and imaging studies  CBC: Recent Labs  Lab 12/24/18 0550 12/25/18 0728 12/27/18 0710  WBC 8.9 8.6 11.0*  NEUTROABS 6.1 6.1  --   HGB 9.4* 9.8* 9.5*  HCT 28.8* 29.8* 30.0*  MCV 101.8* 100.7* 99.7  PLT 203 210 621   Basic Metabolic Panel: Recent Labs  Lab 12/24/18 0550 12/25/18 0728 12/26/18 0717 12/27/18 0710 12/28/18 1151 12/30/18 0944  NA 135 133* 136 132* 133*  --  K 3.1* 3.9 3.2* 4.7 3.9  --   CL 96* 97* 97* 95* 95*  --   CO2 _0 --   GLUCOSE 130* 146* 155* 239* 193*  --   BUN 40* 41* 42* 45* 54*  --   CREATININE 10.98* 10.65* 11.04* 10.97* 11.09*  --   CALCIUM 8.5* 9.0 8.8* 8.7* 8.8*  --   PHOS  --   --   --   --   --  5.6*   GFR: Estimated Creatinine Clearance: 9.2 mL/min (A) (by C-G formula based on SCr of 11.09 mg/dL (H)). Liver Function Tests: Recent Labs  Lab 12/27/18 0710  AST 13*  ALT 15  ALKPHOS 91  BILITOT 0.7  PROT 6.6  ALBUMIN 2.4*   CBG: Recent Labs  Lab 12/29/18 1136 12/29/18 1634 12/29/18 2056 12/30/18 0738 12/30/18 1122  GLUCAP 114* 108* 129* 122* 96   Anemia Panel: No results for input(s): VITAMINB12, FOLATE, FERRITIN, TIBC, IRON, RETICCTPCT in the last 72 hours.  Recent Results (from the past 240 hour(s))  Culture, blood (routine x 2)     Status: None   Collection Time: 12/21/18 11:06 AM  Result Value Ref Range Status   Specimen Description BLOOD BLOOD RIGHT WRIST  Final   Special Requests   Final    BOTTLES DRAWN AEROBIC ONLY Blood Culture results may not  be optimal due to an inadequate volume of blood received in culture bottles   Culture   Final    NO GROWTH 5 DAYS Performed at [facility], [address] [place], [place] [address]    Report Status 12/26/2018 FINAL  Final  MRSA PCR Screening     Status: None   Collection Time: 12/21/18  6:14 PM  Result Value Ref Range Status   MRSA by PCR NEGATIVE NEGATIVE Final    Comment:        The GeneXpert MRSA Assay (FDA approved for NASAL specimens only), is one component of a comprehensive MRSA colonization surveillance program. It is not intended to diagnose MRSA infection nor to guide or monitor treatment for MRSA infections. Performed at [facility], [place] [address], [place], [place] [address]   Culture, body fluid-bottle     Status: None   Collection Time: 12/21/18  8:18 PM  Result Value Ref Range Status   Specimen Description FLUID PERITONEAL DIALYSATE  Final   Special Requests BOTTLES DRAWN AEROBIC AND ANAEROBIC  Final   Culture   Final    NO GROWTH 5 DAYS Performed at [facility], [place] [address] [place], [place] [address]    Report Status 12/26/2018 FINAL  Final  Gram stain     Status: None   Collection Time: 12/21/18  8:18 PM  Result Value Ref Range Status   Specimen Description FLUID PERITONEAL DIALYSATE  Final   Special Requests NONE  Final   Gram Stain   Final    WBC PRESENT,BOTH PMN AND MONONUCLEAR NO ORGANISMS SEEN CYTOSPIN SMEAR Performed at [facility], [address] [place], [place] [address]    Report Status 12/21/2018 FINAL  Final  Aerobic/Anaerobic Culture (surgical/deep wound)     Status: None (Preliminary result)   Collection Time: 12/28/18 10:53 AM  Result Value Ref Range Status   Specimen Description FLUID  Final   Special Requests NONE  Final   Gram Stain   Final    RARE WBC PRESENT, PREDOMINANTLY MONONUCLEAR NO ORGANISMS SEEN  Culture   Final    NO GROWTH 2 DAYS Performed at [facility], [place] [address],  [place], [place] [address]    Report Status PENDING  Incomplete  Fungus Culture With Stain     Status: None (Preliminary result)   Collection Time: 12/28/18 10:54 AM  Result Value Ref Range Status   Fungus Stain Final report  Final    Comment: (NOTE) Performed At: [facility] [place], [place] [id number] Rush Farmer MD [id number]    Fungus (Mycology) Culture PENDING  Incomplete   Fungal Source FLUID  Final  Fungus Culture Result     Status: None   Collection Time: 12/28/18 10:54 AM  Result Value Ref Range Status   Result 1 Comment  Final    Comment: (NOTE) KOH/Calcofluor preparation:  no fungus observed. Performed At: [facility] [place], [place] [id number] Rush Farmer MD [id number]        Radiology Studies: No results found.    Scheduled Meds: . amLODipine  5 mg Oral Daily  . cephALEXin  500 mg Oral Q12H  . [START ON 01/01/2019] diphenhydrAMINE  50 mg Oral Once  . gentamicin cream  1 application Topical Daily  . heparin  5,000 Units Subcutaneous Q8H  . insulin aspart  0-20 Units Subcutaneous TID WC  . insulin detemir  10 Units Subcutaneous Daily  . lanthanum  1,500 mg Oral TID WC  . mouth rinse  15 mL Mouth Rinse BID  . multivitamin  1 tablet Oral Daily  . potassium chloride SA  40 mEq Oral Daily  . [START ON 12/31/2018] predniSONE  50 mg Oral Q6H  . senna-docusate  1 tablet Oral BID  . sodium chloride flush  3 mL Intravenous Q12H   Continuous Infusions: . sodium chloride    . dialysis solution 2.5% low-MG/low-CA    . dialysis solution 2.5% low-MG/low-CA    . dialysis solution 2.5% low-MG/low-CA       LOS: 9 days    Vernell Leep, MD, FACP, [facility]. [facility] Pager [phone]  If 7PM-7AM, please contact night-coverage [email or web address] Password [facility] 12/30/2018, 3:38 PM

## 2018-12-31 DIAGNOSIS — R109 Unspecified abdominal pain: Secondary | ICD-10-CM

## 2018-12-31 LAB — GLUCOSE, CAPILLARY
GLUCOSE-CAPILLARY: 127 mg/dL — AB (ref 70–99)
GLUCOSE-CAPILLARY: 95 mg/dL (ref 70–99)
Glucose-Capillary: 132 mg/dL — ABNORMAL HIGH (ref 70–99)
Glucose-Capillary: 126 mg/dL — ABNORMAL HIGH (ref 70–99)

## 2018-12-31 NOTE — Progress Notes (Signed)
[facility] Progress Note    Assessment/ Plan:   53 year old female patient with a history of end-stage renal disease([place])on peritoneal dialysis presents with influenza B.   1. ESRD on PD. Continue current PD Rx. Volume status stable.  Seen on PD 2.5% total UF 1464m/1386/1.2/1.4/1.3 over5days.  - Wiill continue current regimen.  - Panniculitis with focal collection along catheter tunnel tract. - Appreciate Dr. HPamalee Leydenthe peritract collection -> not significant. But given the panniculitis and the fact that she does not want to go on HD even termporarily we need to r/o catheter leak. She will be premedicated  for contrast allergy in anticipation of pd gram on Monday.  - If there is a leak then we need to move the catheter to the right side and transition to iHD temporarily.  - This is not the classical appearance of calciphylaxis but may be  worthwhile getting a biopsy to r/o. If positive will place on NaThiosulfate. She's not on Coumadin. Checking PTH (sent 1/12).  2. Anemia. Likely due to CKD. Aranesp x1 on 12/22/2017. Eventually will give iron but not while an infection is being treated. 3. Secondary hyperpara. Continue lanthanum for phosphate control. Phos has improved from 6.4 to 5.6. Cont current regimen. 4. Influenza B. Improving.  5. Hypokalemia. Likely secondary to peritoneal dialysis. - Addedoral potassium 462m daily-> will recheck chem panel in AM K4.7 on 1/8-> back down to 3.9 on daily 4054mpo. Continue for now.  Subjective:   C/o lower abdesp onleftsidebut improving  Cough NP, chills but no fever  Overallstillfeels better and tolerating PO's.   Objective:   BP 125/61 (BP Location: Right Arm)   Pulse 79   Temp 98.4 F (36.9 C) (Oral)   Resp 18   Ht _0  (1.651 m)   Wt (!) 159.9 kg   SpO2 100%   BMI 58.66 kg/m   Intake/Output Summary (Last 24 hours) at 12/31/2018 0830 Last data filed at  12/31/2018 0200 Gross per 24 hour  Intake 840 ml  Output 100 ml  Net 740 ml   Weight change: -1.6 kg  Physical Exam: General: A&Ox3 NAD CV: Heart RRR  Lungs: L/S CTA bilaterally Abd: abd SNT/ND with normal BS, obese, LLQ pd catheter, panniculitis and tender esp LLQ Extremities: Bilateral LE lymphedema  Imaging: Dg Abd Portable 1v  Result Date: 12/30/2018 CLINICAL DATA:  Peritoneal dialysis catheter dysfunction EXAM: PORTABLE ABDOMEN - 1 VIEW COMPARISON:  CT 12/27/2018, radiograph 02/13/2018 FINDINGS: Radiopaque material within the colon. Gas pattern is nonobstructed. The tip of the dialysis catheter is not confidently visualized. IMPRESSION: Limited study secondary to extensive radiopaque material within the colon. Dialysis catheter tip is poorly visualized. Electronically Signed   By: KimDonavan FoilD.   On: 12/30/2018 21:34    Labs: BMET Recent Labs  Lab 12/25/18 0728 12/26/18 0717 12/27/18 0710 12/28/18 1151 12/30/18 0944  NA 133* 136 132* 133*  --   K 3.9 3.2* 4.7 3.9  --   CL 97* 97* 95* 95*  --   CO2 _1 --   GLUCOSE 146* 155* 239* 193*  --   BUN 41* 42* 45* 54*  --   CREATININE 10.65* 11.04* 10.97* 11.09*  --   CALCIUM 9.0 8.8* 8.7* 8.8*  --   PHOS  --   --   --   --  5.6*   CBC Recent Labs  Lab 12/25/18 0728 12/27/18 0710  WBC 8.6 11.0*  NEUTROABS 6.1  --  HGB 9.8* 9.5*  HCT 29.8* 30.0*  MCV 100.7* 99.7  PLT 210 223    Medications:    . amLODipine  5 mg Oral Daily  . cephALEXin  500 mg Oral Q12H  . [START ON 01/01/2019] diphenhydrAMINE  50 mg Oral Once  . gentamicin cream  1 application Topical Daily  . heparin  5,000 Units Subcutaneous Q8H  . insulin aspart  0-20 Units Subcutaneous TID WC  . insulin detemir  10 Units Subcutaneous Daily  . lanthanum  1,500 mg Oral TID WC  . mouth rinse  15 mL Mouth Rinse BID  . multivitamin  1 tablet Oral Daily  . potassium chloride SA  40 mEq Oral Daily  . predniSONE  50 mg Oral Q6H  .  senna-docusate  1 tablet Oral BID  . sodium chloride flush  3 mL Intravenous Q12H      Otelia Santee, MD 12/31/2018, 8:30 AM

## 2018-12-31 NOTE — Progress Notes (Signed)
PD tx initiated via tenckhoff w/o problem VSS Report given to Fredrich Romans, RN

## 2018-12-31 NOTE — Progress Notes (Signed)
PROGRESS NOTE    [name]  PTW:[id number] DOB: 1966-02-20 DOA: 12/21/2018 PCP: [name], MD     Brief Narrative:  [name] is a 54 y.o. female with ESRD on PD presented to ED by EMS with complaints of 10/10 pain in bilateral legs and abdomen.  She feels like she might have a recurrence of cellulitis in her legs which she has had multiple times in the past. She reports that she had dialysis at home last night.  She reports that her legs have increased in size recently.  She has chronic lymphedema in both legs which is severe.  She reports abdominal pain in LLQ below her PD cath site.  She reports intermittent fever at home but no chills.  She denies nausea and vomiting.  She has been concerned about blood clots in legs as she is very immobile.  She tested positive for flu B and was placed on Tamiflu.  There was concern for peritonitis, patient underwent paracentesis which was negative for peritonitis.  Nephrology was consulted.  She was started on Augmentin for left lower quadrant abdominal wall cellulitis.  CT abdomen showed fluid collection near PD catheter.  IR consulted and minimal/1 mL blood-tinged fluid aspirated.  New events last 24 hours / Subjective: Interviewed and examined with nurse.  States that her suprapubic area and left lower and mid quadrant abdominal swelling and pain have improved by 60%.  Had normal BM.  Denies any other complaints.  Assessment & Plan:   Principal Problem:   Influenza B Active Problems:   Lymphedema of lower extremity   Diabetes mellitus (HCC)   Morbid obesity (HCC)   HTN (hypertension)   Chronic diastolic CHF (congestive heart failure) (HCC)   ESRD on dialysis (HCC)   ESRD (end stage renal disease) (HCC)   Cor pulmonale, chronic (HCC)   Hypokalemia   Peritonitis (HCC)   Abdominal pain, LLQ   Influenza B Tamiflu received 56m dose, which is full 5 day course for pt on PD. Discussed with pharmacist. Droplet precaution.  Flu  symptoms have resolved.  Nonpurulent cellulitis/panniculitis LLQ abdominal wall/subcutaneous collection on CT Initially started on Augmentin but was not improving.  Hence CT abdomen without contrast was obtained on 1/7 which showed 2.4 cm deep subcutaneous collection along the PD catheter site. IR was only able to aspirate 1 mL fluid, culture negative to date. Plan is for patient to rule out PD catheter leak by premeds followed by contrasted study >this was supposed to be done today but now postponed to 1/13. Augmentin was changed to Keflex couple days ago.  Clinically improving. As discussed with Dr. LAugustin Coupe nephrology on 1/11, concerned that area of suspected cellulitis is not improving fast enough, concern for calciphylaxis and suggest deep biopsy.  She has extensive Peau D'Orange changes in the suprapubic, left lower and mid abdominal quadrants. I discussed with CCS MD on call on 1/11 who recommended ruling out PD catheter leak and then contacting again if biopsy still warranted.  Constipation Bowel regimen ordered, suppository prn.  Having BMs.  Bilateral leg pain/chronic lymphedema DVT ultrasound was negative  ESRD on peritoneal dialysis Nephrology consulted and undergoing PD under their direction.  PD catheter evaluation as indicated above.  Patient does not wish to go on HD even temporarily but if catheter does show a leak, she will have to.  [place]  Type 2 diabetes Levemir, sliding-scale insulin.  Good control today.  No changes.  Essential  hypertension Continue Norvasc.  Controlled.  Morbid obesity Body mass index is 58.66 kg/m.   Anxiety Continue Xanax   DVT prophylaxis: Subq hep Code Status: Full Family Communication:  No family at bedside Disposition Plan: Pending further improvement in abdominal wall cellulitis    Consultants:   Nephrology  Interventional radiology  Procedures:   PD  Aspiration of abdominal  wall collection by IR on 1/9.  Antimicrobials:  Anti-infectives (From admission, onward)   Start     Dose/Rate Route Frequency Ordered Stop   12/27/18 2200  cephALEXin (KEFLEX) capsule 500 mg     500 mg Oral Every 12 hours 12/27/18 1846     12/23/18 1200  amoxicillin-clavulanate (AUGMENTIN) 500-125 MG per tablet 500 mg  Status:  Discontinued     1 tablet Oral Every 24 hours 12/23/18 1022 12/27/18 1846   12/23/18 1015  amoxicillin-clavulanate (AUGMENTIN) 875-125 MG per tablet 1 tablet  Status:  Discontinued     1 tablet Oral Every 12 hours 12/23/18 1014 12/23/18 1021   12/22/18 1000  oseltamivir (TAMIFLU) capsule 30 mg  Status:  Discontinued     30 mg Oral Daily 12/21/18 1631 12/21/18 1738   12/21/18 1700  cefTAZidime (FORTAZ) 2 g in sodium chloride 0.9 % 100 mL IVPB  Status:  Discontinued     2 g 200 mL/hr over 30 Minutes Intravenous  Once 12/21/18 1655 12/21/18 2016   12/21/18 1700  ceFAZolin (ANCEF) IVPB 1 g/50 mL premix  Status:  Discontinued     1 g 100 mL/hr over 30 Minutes Intravenous  Once 12/21/18 1655 12/21/18 2016   12/21/18 1215  oseltamivir (TAMIFLU) capsule 30 mg     30 mg Oral  Once 12/21/18 1209 12/21/18 1307       Objective: Vitals:   12/30/18 2100 12/31/18 0551 12/31/18 0805 12/31/18 0937  BP:  129/61 125/61 (!) 125/55  Pulse:  74 79 78  Resp:  _0 Temp:  98.3 F (36.8 C) 98.4 F (36.9 C) 98.1 F (36.7 C)  TempSrc:  Oral Oral Oral  SpO2:  95% 100% 97%  Weight: (!) 161.4 kg  (!) 159.9 kg   Height:        Intake/Output Summary (Last 24 hours) at 12/31/2018 1419 Last data filed at 12/31/2018 0900 Gross per 24 hour  Intake 600 ml  Output 100 ml  Net 500 ml   Filed Weights   12/30/18 1732 12/30/18 2100 12/31/18 0805  Weight: (!) 160.5 kg (!) 161.4 kg (!) 159.9 kg    Examination: General exam: Pleasant young female, moderately built and morbidly obese, lying comfortably propped up in bed. Respiratory system: Clear to auscultation.  No increased  work of breathing. Cardiovascular system: S1 & S2 heard, RRR. No JVD, murmurs, rubs, gallops or clicks. No pedal edema.  Stable. Gastrointestinal system: Abdomen is obese.  Left-sided PD catheter site intact.  Examined today with assistance from her female RN. She has extensive Peau D'Orange changes in the suprapubic, left lower and mid abdominal quadrants.  She is tender in these areas but no erythema or warmth.  No crepitus. Central nervous system: Alert and oriented. No focal neurological deficits. Extremities: Symmetric 5 x 5 power.  Changes of chronic severe bilateral lower extremity lymphedema without acute findings. Psychiatry: Judgement and insight appear normal. Mood & affect appropriate.    Data Reviewed: I have personally reviewed following labs and imaging studies  CBC: Recent Labs  Lab 12/25/18 0728 12/27/18 0710  WBC 8.6 11.0*  NEUTROABS 6.1  --   HGB 9.8* 9.5*  HCT 29.8* 30.0*  MCV 100.7* 99.7  PLT 210 295   Basic Metabolic Panel: Recent Labs  Lab 12/25/18 0728 12/26/18 0717 12/27/18 0710 12/28/18 1151 12/30/18 0944  NA 133* 136 132* 133*  --   K 3.9 3.2* 4.7 3.9  --   CL 97* 97* 95* 95*  --   CO2 _0 --   GLUCOSE 146* 155* 239* 193*  --   BUN 41* 42* 45* 54*  --   CREATININE 10.65* 11.04* 10.97* 11.09*  --   CALCIUM 9.0 8.8* 8.7* 8.8*  --   PHOS  --   --   --   --  5.6*   GFR: Estimated Creatinine Clearance: 9.2 mL/min (A) (by C-G formula based on SCr of 11.09 mg/dL (H)). Liver Function Tests: Recent Labs  Lab 12/27/18 0710  AST 13*  ALT 15  ALKPHOS 91  BILITOT 0.7  PROT 6.6  ALBUMIN 2.4*   CBG: Recent Labs  Lab 12/30/18 1122 12/30/18 1648 12/30/18 2137 12/31/18 0740 12/31/18 1111  GLUCAP 96 91 94 132* 127*   Anemia Panel: No results for input(s): VITAMINB12, FOLATE, FERRITIN, TIBC, IRON, RETICCTPCT in the last 72 hours.  Recent Results (from the past 240 hour(s))  MRSA PCR Screening     Status: None   Collection Time:  12/21/18  6:14 PM  Result Value Ref Range Status   MRSA by PCR NEGATIVE NEGATIVE Final    Comment:        The GeneXpert MRSA Assay (FDA approved for NASAL specimens only), is one component of a comprehensive MRSA colonization surveillance program. It is not intended to diagnose MRSA infection nor to guide or monitor treatment for MRSA infections. Performed at [facility], [place] [address] [place], [place] [address]   Culture, body fluid-bottle     Status: None   Collection Time: 12/21/18  8:18 PM  Result Value Ref Range Status   Specimen Description FLUID PERITONEAL DIALYSATE  Final   Special Requests BOTTLES DRAWN AEROBIC AND ANAEROBIC  Final   Culture   Final    NO GROWTH 5 DAYS Performed at [facility], [place] [address], [place], [place] [address]    Report Status 12/26/2018 FINAL  Final  Gram stain     Status: None   Collection Time: 12/21/18  8:18 PM  Result Value Ref Range Status   Specimen Description FLUID PERITONEAL DIALYSATE  Final   Special Requests NONE  Final   Gram Stain   Final    WBC PRESENT,BOTH PMN AND MONONUCLEAR NO ORGANISMS SEEN CYTOSPIN SMEAR Performed at [facility], [address], [place], [place] [address]    Report Status 12/21/2018 FINAL  Final  Aerobic/Anaerobic Culture (surgical/deep wound)     Status: None (Preliminary result)   Collection Time: 12/28/18 10:53 AM  Result Value Ref Range Status   Specimen Description FLUID  Final   Special Requests NONE  Final   Gram Stain   Final    RARE WBC PRESENT, PREDOMINANTLY MONONUCLEAR NO ORGANISMS SEEN    Culture   Final    NO GROWTH 3 DAYS NO ANAEROBES ISOLATED; CULTURE IN PROGRESS FOR 5 DAYS Performed at [facility], [place] [address] [place], [place] [address]    Report Status PENDING  Incomplete  Fungus Culture With Stain     Status: None (Preliminary result)   Collection Time: 12/28/18 10:54  AM  Result Value Ref Range Status   Fungus Stain Final report  Final     Comment: (NOTE) Performed At: [facility] [place], [place] [id number] Rush Farmer MD [id number]    Fungus (Mycology) Culture PENDING  Incomplete   Fungal Source FLUID  Final  Fungus Culture Result     Status: None   Collection Time: 12/28/18 10:54 AM  Result Value Ref Range Status   Result 1 Comment  Final    Comment: (NOTE) KOH/Calcofluor preparation:  no fungus observed. Performed At: [facility] [place], [place] [id number] Rush Farmer MD [id number]        Radiology Studies: Dg Abd Portable 1v  Result Date: 12/30/2018 CLINICAL DATA:  Peritoneal dialysis catheter dysfunction EXAM: PORTABLE ABDOMEN - 1 VIEW COMPARISON:  CT 12/27/2018, radiograph 02/13/2018 FINDINGS: Radiopaque material within the colon. Gas pattern is nonobstructed. The tip of the dialysis catheter is not confidently visualized. IMPRESSION: Limited study secondary to extensive radiopaque material within the colon. Dialysis catheter tip is poorly visualized. Electronically Signed   By: Donavan Foil M.D.   On: 12/30/2018 21:34      Scheduled Meds: . amLODipine  5 mg Oral Daily  . cephALEXin  500 mg Oral Q12H  . [START ON 01/01/2019] diphenhydrAMINE  50 mg Oral Once  . gentamicin cream  1 application Topical Daily  . heparin  5,000 Units Subcutaneous Q8H  . insulin aspart  0-20 Units Subcutaneous TID WC  . insulin detemir  10 Units Subcutaneous Daily  . lanthanum  1,500 mg Oral TID WC  . mouth rinse  15 mL Mouth Rinse BID  . multivitamin  1 tablet Oral Daily  . potassium chloride SA  40 mEq Oral Daily  . predniSONE  50 mg Oral Q6H  . senna-docusate  1 tablet Oral BID  . sodium chloride flush  3 mL Intravenous Q12H   Continuous Infusions: . sodium chloride    . dialysis solution 2.5% low-MG/low-CA    . dialysis solution 2.5% low-MG/low-CA    . dialysis solution 2.5% low-MG/low-CA       LOS: 10 days    Vernell Leep, MD, FACP, [facility]. [facility] Pager [phone]  If 7PM-7AM, please contact night-coverage [email or web address] Password [email or web address] 12/31/2018, 2:19 PM

## 2019-01-01 ENCOUNTER — Encounter (HOSPITAL_COMMUNITY): Payer: Self-pay | Admitting: Interventional Radiology

## 2019-01-01 ENCOUNTER — Inpatient Hospital Stay (HOSPITAL_COMMUNITY): Payer: Medicare Other

## 2019-01-01 HISTORY — PX: IR SINUS/FIST TUBE CHK-NON GI: IMG673

## 2019-01-01 LAB — PARATHYROID HORMONE, INTACT (NO CA): PTH: 376 pg/mL — ABNORMAL HIGH (ref 15–65)

## 2019-01-01 LAB — GLUCOSE, CAPILLARY
GLUCOSE-CAPILLARY: 267 mg/dL — AB (ref 70–99)
Glucose-Capillary: 261 mg/dL — ABNORMAL HIGH (ref 70–99)
Glucose-Capillary: 245 mg/dL — ABNORMAL HIGH (ref 70–99)
Glucose-Capillary: 262 mg/dL — ABNORMAL HIGH (ref 70–99)

## 2019-01-01 MED ORDER — HEPARIN 1000 UNIT/ML FOR PERITONEAL DIALYSIS: 500.0000 [IU] | INTRAMUSCULAR | Status: DC | PRN

## 2019-01-01 MED ORDER — IOPAMIDOL (ISOVUE-300) INJECTION 61%
INTRAVENOUS | Status: AC
  Administered 2019-01-01: 10 mL
  Filled 2019-01-01: qty 50

## 2019-01-01 MED ORDER — GENTAMICIN SULFATE 0.1 % EX CREA: 1.0000 "application " | TOPICAL_CREAM | Freq: Every day | CUTANEOUS | Status: DC

## 2019-01-01 MED ORDER — HEPARIN SODIUM (PORCINE) 1000 UNIT/ML IJ SOLN
INTRAMUSCULAR | Status: AC
  Filled 2019-01-01: qty 1

## 2019-01-01 MED ORDER — ONDANSETRON HCL 4 MG/2ML IJ SOLN
4.0000 mg | Freq: Once | INTRAMUSCULAR | Status: AC
  Administered 2019-01-01: 4 mg via INTRAVENOUS
  Filled 2019-01-01: qty 2

## 2019-01-01 MED ORDER — LIDOCAINE HCL 1 % IJ SOLN
INTRAMUSCULAR | Status: AC
  Filled 2019-01-01: qty 20

## 2019-01-01 NOTE — Progress Notes (Signed)
Subjective: Interval History: has complaints sore, LLQ,  PD going well.  Objective: Vital signs in last 24 hours: Temp:  [97.8 F (36.6 C)-98.6 F (37 C)] 98.6 F (37 C) (01/13 0800) Pulse Rate:  [65-76] 65 (01/13 0800) Resp:  [19-26] 22 (01/13 0800) BP: (114-131)/(62-98) 118/62 (01/13 0800) SpO2:  [97 %-100 %] 97 % (01/13 0800) Weight:  [160.2 kg] 160.2 kg (01/12 1950) Weight change: -0.1 kg  Intake/Output from previous day: 01/12 0701 - 01/13 0700 In: 600 [P.O.:600] Out: 0  Intake/Output this shift: Total I/O In: 240 [P.O.:240] Out: 0   General appearance: alert, cooperative, no distress and morbidly obese Resp: diminished breath sounds bilaterally Cardio: S1, S2 normal and systolic murmur: systolic ejection 2/6, crescendo and decrescendo at 2nd left intercostal space GI: obese, pos bs < PD ES LUQ. edema, and Peau d orange appearance LLQ, tender,  Extremities: Lympahedema L>R  Lab Results: No results for input(s): WBC, HGB, HCT, PLT in the last 72 hours. BMET: No results for input(s): NA, K, CL, CO2, GLUCOSE, BUN, CREATININE, CALCIUM in the last 72 hours. No results for input(s): PTH in the last 72 hours. Iron Studies: No results for input(s): IRON, TIBC, TRANSFERRIN, FERRITIN in the last 72 hours.  Studies/Results: Ir Sinus/fist Tube Chk-non Gi  Result Date: 01/01/2019 INDICATION: Evaluate left lower quadrant peritoneal catheter for leak EXAM: SINUS TRACT INJECTION / FISTULOGRAM COMPARISON:  None. MEDICATIONS: The patient is currently admitted to the hospital and receiving intravenous antibiotics. The antibiotics were administered within an appropriate time frame prior to the initiation of the procedure. ANESTHESIA/SEDATION: None COMPLICATIONS: None immediate. TECHNIQUE: Informed written consent was obtained from the patient after a thorough discussion of the procedural risks, benefits and alternatives. All questions were addressed. Maximal Sterile Barrier Technique was  utilized including caps, mask, sterile gowns, sterile gloves, sterile drape, hand hygiene and skin antiseptic. A timeout was performed prior to the initiation of the procedure. The patient was pre-medicated with cortical steroids and Benadryl. PROCEDURE: Contrast was injected into the peritoneal catheter and imaging was performed in multiple views. Contrast was then aspirated. The catheter was again flushed with saline. FINDINGS: The peritoneal dialysis catheter is positioned in the left paracolic gutter. It is widely patent. Fluid enters the left paracolic gutters. There is no evidence of contrast extravasation into the peritoneal fat to suggest a leak. IMPRESSION: The left lower quadrant peritoneal catheter is functioning normally, is patent, and there is no evidence of leak. Electronically Signed   By: Marybelle Killings M.D.   On: 01/01/2019 09:29   Dg Abd Portable 1v  Result Date: 12/30/2018 CLINICAL DATA:  Peritoneal dialysis catheter dysfunction EXAM: PORTABLE ABDOMEN - 1 VIEW COMPARISON:  CT 12/27/2018, radiograph 02/13/2018 FINDINGS: Radiopaque material within the colon. Gas pattern is nonobstructed. The tip of the dialysis catheter is not confidently visualized. IMPRESSION: Limited study secondary to extensive radiopaque material within the colon. Dialysis catheter tip is poorly visualized. Electronically Signed   By: Donavan Foil M.D.   On: 12/30/2018 21:34    I have reviewed the patient's current medications.  Assessment/Plan: 1 ESRD PD ok, ? Tunnel infx.  Vol xs.  2 Cellulitis/pannicultiis  Cont AB, could be caliphylaxis but cannot give Na thio on PD.   3 Massive obesity 4 Anemia needs esa 5 HPTH PTHok. Phos falling P PD with 4.25%.  Not sure bx would be useful.      LOS: 11 days   Jeneen Rinks Kylan Liberati 01/01/2019,11:29 AM

## 2019-01-01 NOTE — Progress Notes (Signed)
PROGRESS NOTE    [name]  ZOX:[id number] DOB: 1966/11/25 DOA: 12/21/2018 PCP: [name], MD     Brief Narrative:  [name] is a 53 y.o. female with ESRD on PD presented to ED by EMS with complaints of 10/10 pain in bilateral legs and abdomen.  She feels like she might have a recurrence of cellulitis in her legs which she has had multiple times in the past. She reports that she had dialysis at home last night.  She reports that her legs have increased in size recently.  She has chronic lymphedema in both legs which is severe.  She reports abdominal pain in LLQ below her PD cath site.  She reports intermittent fever at home but no chills.  She denies nausea and vomiting.  She has been concerned about blood clots in legs as she is very immobile.  She tested positive for flu B and was placed on Tamiflu.  There was concern for peritonitis, patient underwent paracentesis which was negative for peritonitis.  Nephrology was consulted.  She was started on Augmentin for left lower quadrant abdominal wall cellulitis.  CT abdomen showed fluid collection near PD catheter.  IR consulted and minimal/1 mL blood-tinged fluid aspirated.  New events last 24 hours / Subjective: Reported that she had one episode of nonbloody emesis last night but since then no nausea or vomiting.  Abdominal pain continues to improve.  Last BM day before yesterday.  Assessment & Plan:   Principal Problem:   Influenza B Active Problems:   Lymphedema of lower extremity   Diabetes mellitus (HCC)   Morbid obesity (HCC)   HTN (hypertension)   Chronic diastolic CHF (congestive heart failure) (HCC)   ESRD on dialysis (HCC)   ESRD (end stage renal disease) (HCC)   Cor pulmonale, chronic (HCC)   Hypokalemia   Peritonitis (HCC)   Abdominal pain, LLQ   Influenza B Tamiflu received 69m dose, which is full 5 day course for pt on PD. Discussed with pharmacist. Droplet precaution.  Flu symptoms have  resolved.  Nonpurulent cellulitis/panniculitis LLQ abdominal wall/subcutaneous collection on CT Initially started on Augmentin but was not improving.  Hence CT abdomen without contrast was obtained on 1/7 which showed 2.4 cm deep subcutaneous collection along the PD catheter site. IR was only able to aspirate 1 mL fluid, culture negative to date. Plan is for patient to rule out PD catheter leak by premeds followed by contrasted study >this was supposed to be done today but now postponed to 1/13. Augmentin was changed to Keflex couple days ago.  Clinically improving. As discussed with Dr. LAugustin Coupe nephrology on 1/11, concerned that area of suspected cellulitis is not improving fast enough, concern for calciphylaxis and suggest deep biopsy.  She has extensive Peau D'Orange changes in the suprapubic, left lower and mid abdominal quadrants. PD catheter tested with contrast study on 1/13, functioning normally, patent and no evidence of leak.  As per nephrology follow-up today, not sure if biopsy would be helpful and moreover cannot give sodium thiosulfate while on PD. May consider discharging on oral Keflex and reassess in a week or so.  Constipation Bowel regimen ordered, suppository prn.  Having BMs.  Bilateral leg pain/chronic lymphedema DVT ultrasound was negative  ESRD on peritoneal dialysis Nephrology consulted and undergoing PD under their direction.  PD catheter evaluation as indicated above.  Patient does not wish to go on HD even temporarily but if catheter does show a leak, she will have to.  She  follows with Dr. Lowanda Foster, nephrology in [place].  Type 2 diabetes Levemir, sliding-scale insulin.  Good control today.  No changes.  Essential hypertension Continue Norvasc.  Controlled.  Morbid obesity Body mass index is 58.77 kg/m.   Anxiety Continue Xanax   DVT prophylaxis: Subq hep Code Status: Full Family Communication:  No family at bedside Disposition Plan: Continue PD,  possible DC home in a.m. 1/14.   Consultants:   Nephrology  Interventional radiology  Procedures:   PD  Aspiration of abdominal wall collection by IR on 1/9.  Antimicrobials:  Anti-infectives (From admission, onward)   Start     Dose/Rate Route Frequency Ordered Stop   12/27/18 2200  cephALEXin (KEFLEX) capsule 500 mg     500 mg Oral Every 12 hours 12/27/18 1846     12/23/18 1200  amoxicillin-clavulanate (AUGMENTIN) 500-125 MG per tablet 500 mg  Status:  Discontinued     1 tablet Oral Every 24 hours 12/23/18 1022 12/27/18 1846   12/23/18 1015  amoxicillin-clavulanate (AUGMENTIN) 875-125 MG per tablet 1 tablet  Status:  Discontinued     1 tablet Oral Every 12 hours 12/23/18 1014 12/23/18 1021   12/22/18 1000  oseltamivir (TAMIFLU) capsule 30 mg  Status:  Discontinued     30 mg Oral Daily 12/21/18 1631 12/21/18 1738   12/21/18 1700  cefTAZidime (FORTAZ) 2 g in sodium chloride 0.9 % 100 mL IVPB  Status:  Discontinued     2 g 200 mL/hr over 30 Minutes Intravenous  Once 12/21/18 1655 12/21/18 2016   12/21/18 1700  ceFAZolin (ANCEF) IVPB 1 g/50 mL premix  Status:  Discontinued     1 g 100 mL/hr over 30 Minutes Intravenous  Once 12/21/18 1655 12/21/18 2016   12/21/18 1215  oseltamivir (TAMIFLU) capsule 30 mg     30 mg Oral  Once 12/21/18 1209 12/21/18 1307       Objective: Vitals:   12/31/18 1557 12/31/18 1930 12/31/18 1950 01/01/19 0800  BP: 130/67 114/62 (!) 131/98 118/62  Pulse: 76 72 76 65  Resp: 20 19 (!) 26 (!) 22  Temp: 97.8 F (36.6 C) 98.4 F (36.9 C) 98.5 F (36.9 C) 98.6 F (37 C)  TempSrc: Oral Oral Oral Oral  SpO2: 100% 100% 100% 97%  Weight:   (!) 160.2 kg   Height:        Intake/Output Summary (Last 24 hours) at 01/01/2019 1212 Last data filed at 01/01/2019 1030 Gross per 24 hour  Intake 600 ml  Output 0 ml  Net 600 ml   Filed Weights   12/30/18 2100 12/31/18 0805 12/31/18 1950  Weight: (!) 161.4 kg (!) 159.9 kg (!) 160.2 kg    Examination: No  significant change in clinical exam compared to yesterday. General exam: Pleasant young female, moderately built and morbidly obese, lying comfortably propped up in bed. Respiratory system: Clear to auscultation.  No increased work of breathing. Cardiovascular system: S1 & S2 heard, RRR. No JVD, murmurs, rubs, gallops or clicks. No pedal edema.  Stable. Gastrointestinal system: Abdomen is obese.  Left-sided PD catheter site intact.  She has extensive Peau D'Orange changes in the suprapubic, left lower and mid abdominal quadrants.  She is tender in these areas but no erythema or warmth.  No crepitus. Central nervous system: Alert and oriented. No focal neurological deficits. Extremities: Symmetric 5 x 5 power.  Changes of chronic severe bilateral lower extremity lymphedema without acute findings. Psychiatry: Judgement and insight appear normal. Mood & affect appropriate.  Data Reviewed: I have personally reviewed following labs and imaging studies  CBC: Recent Labs  Lab 12/27/18 0710  WBC 11.0*  HGB 9.5*  HCT 30.0*  MCV 99.7  PLT 301   Basic Metabolic Panel: Recent Labs  Lab 12/26/18 0717 12/27/18 0710 12/28/18 1151 12/30/18 0944  NA 136 132* 133*  --   K 3.2* 4.7 3.9  --   CL 97* 95* 95*  --   CO2 _0 --   GLUCOSE 155* 239* 193*  --   BUN 42* 45* 54*  --   CREATININE 11.04* 10.97* 11.09*  --   CALCIUM 8.8* 8.7* 8.8*  --   PHOS  --   --   --  5.6*   GFR: Estimated Creatinine Clearance: 9.2 mL/min (A) (by C-G formula based on SCr of 11.09 mg/dL (H)). Liver Function Tests: Recent Labs  Lab 12/27/18 0710  AST 13*  ALT 15  ALKPHOS 91  BILITOT 0.7  PROT 6.6  ALBUMIN 2.4*   CBG: Recent Labs  Lab 12/31/18 1111 12/31/18 1558 12/31/18 2116 01/01/19 0735 01/01/19 1052  GLUCAP 127* 95 126* 261* 245*   Anemia Panel: No results for input(s): VITAMINB12, FOLATE, FERRITIN, TIBC, IRON, RETICCTPCT in the last 72 hours.  Recent Results (from the past 240 hour(s))   Aerobic/Anaerobic Culture (surgical/deep wound)     Status: None (Preliminary result)   Collection Time: 12/28/18 10:53 AM  Result Value Ref Range Status   Specimen Description FLUID  Final   Special Requests NONE  Final   Gram Stain   Final    RARE WBC PRESENT, PREDOMINANTLY MONONUCLEAR NO ORGANISMS SEEN    Culture   Final    NO GROWTH 4 DAYS NO ANAEROBES ISOLATED; CULTURE IN PROGRESS FOR 5 DAYS Performed at [facility], [address] [place], [place] [address]    Report Status PENDING  Incomplete  Fungus Culture With Stain     Status: None (Preliminary result)   Collection Time: 12/28/18 10:54 AM  Result Value Ref Range Status   Fungus Stain Final report  Final    Comment: (NOTE) Performed At: [facility] [place], [place] [id number] Rush Farmer MD FC:[id number]    Fungus (Mycology) Culture PENDING  Incomplete   Fungal Source FLUID  Final  Fungus Culture Result     Status: None   Collection Time: 12/28/18 10:54 AM  Result Value Ref Range Status   Result 1 Comment  Final    Comment: (NOTE) KOH/Calcofluor preparation:  no fungus observed. Performed At: [facility] [place], [place] [id number] Rush Farmer MD ID:[id number]        Radiology Studies: Ir Sinus/fist Tube Chk-non Gi  Result Date: 01/01/2019 INDICATION: Evaluate left lower quadrant peritoneal catheter for leak EXAM: SINUS TRACT INJECTION / FISTULOGRAM COMPARISON:  None. MEDICATIONS: The patient is currently admitted to the hospital and receiving intravenous antibiotics. The antibiotics were administered within an appropriate time frame prior to the initiation of the procedure. ANESTHESIA/SEDATION: None COMPLICATIONS: None immediate. TECHNIQUE: Informed written consent was obtained from the patient after a thorough discussion of the procedural risks, benefits and alternatives. All questions were addressed. Maximal Sterile Barrier Technique was utilized  including caps, mask, sterile gowns, sterile gloves, sterile drape, hand hygiene and skin antiseptic. A timeout was performed prior to the initiation of the procedure. The patient was pre-medicated with cortical steroids and Benadryl. PROCEDURE: Contrast was injected into the peritoneal catheter and imaging was performed  in multiple views. Contrast was then aspirated. The catheter was again flushed with saline. FINDINGS: The peritoneal dialysis catheter is positioned in the left paracolic gutter. It is widely patent. Fluid enters the left paracolic gutters. There is no evidence of contrast extravasation into the peritoneal fat to suggest a leak. IMPRESSION: The left lower quadrant peritoneal catheter is functioning normally, is patent, and there is no evidence of leak. Electronically Signed   By: Marybelle Killings M.D.   On: 01/01/2019 09:29   Dg Abd Portable 1v  Result Date: 12/30/2018 CLINICAL DATA:  Peritoneal dialysis catheter dysfunction EXAM: PORTABLE ABDOMEN - 1 VIEW COMPARISON:  CT 12/27/2018, radiograph 02/13/2018 FINDINGS: Radiopaque material within the colon. Gas pattern is nonobstructed. The tip of the dialysis catheter is not confidently visualized. IMPRESSION: Limited study secondary to extensive radiopaque material within the colon. Dialysis catheter tip is poorly visualized. Electronically Signed   By: Donavan Foil M.D.   On: 12/30/2018 21:34      Scheduled Meds: . cephALEXin  500 mg Oral Q12H  . gentamicin cream  1 application Topical Daily  . heparin  5,000 Units Subcutaneous Q8H  . insulin aspart  0-20 Units Subcutaneous TID WC  . insulin detemir  10 Units Subcutaneous Daily  . lanthanum  1,500 mg Oral TID WC  . mouth rinse  15 mL Mouth Rinse BID  . multivitamin  1 tablet Oral Daily  . potassium chloride SA  40 mEq Oral Daily  . senna-docusate  1 tablet Oral BID  . sodium chloride flush  3 mL Intravenous Q12H   Continuous Infusions: . sodium chloride    . dialysis solution 2.5%  low-MG/low-CA    . dialysis solution 2.5% low-MG/low-CA    . dialysis solution 2.5% low-MG/low-CA       LOS: 11 days    Vernell Leep, MD, FACP, [facility]. [facility] Pager [phone]  If 7PM-7AM, please contact night-coverage [email or web address] Password [email or web address] 01/01/2019, 12:12 PM

## 2019-01-01 NOTE — Progress Notes (Signed)
CSW spoke with patient's family. CSW informed the family that the primary recommendation. PT and OT is recommending CIR.   CSW spoke with patient's husband about possibly having a SNF as a back up option if patient is denied CIR. He stated that he would have to speak with his wife and decide but wanted to see if she would meet the criteria for CIR first.   CSW will continue to follow.   Domenic Schwab, MSW, [place]

## 2019-01-01 NOTE — Progress Notes (Signed)
Pt is vomiting, messaged MD on call for something IV for nausea.   Eleanora Neighbor, RN

## 2019-01-01 NOTE — Care Management Note (Signed)
Case Management Note Manya Silvas, RN MSN CCM Transitions of Care 4M [place] [phone]  Patient Details  Name: [name] MRN: [id number] Date of Birth: 27-Apr-1966  Subjective/Objective:                 Influenza B   Action/Plan: PTA home with husband. Sister provides transportation to all appointments. Has all DME needs. Noted OT recommendations for snf vs HHOT, PT pending. Not interested in snf - pt is not eligible for snf d/t PD. Pt has used [facility]. Choice list provided. Will f/u referral as patient nears time to transition home. Will continue to follow for transition of care needs.   Expected Discharge Date:                  Expected Discharge Plan:  [place]  In-House Referral:  NA  Discharge planning Services  CM Consult  Post Acute Care Choice:  NA Choice offered to:  Patient  DME Arranged:  N/A DME Agency:  NA  HH Arranged:  PT, OT HH Agency:  [facility]  Status of Service:  In process, will continue to follow  If discussed at Long Length of Stay Meetings, dates discussed:    Additional Comments: 01/01/2018-1451-Discussed patient anticipated transition home 1/14. Patient agreed to HHPT/OT per recommendations from PT and OT. Choice offered to patient who wants [facility]. Referral accepted.  Will need HH and Face to Face orders when ready to transition home. Pt states that she usually is transported [facility] by [facility] and understands that there may be separate billing. No medication issues identified. Will continue to follow for transition of care needs.   Bartholomew Crews, RN 01/01/2019, 2:51 PM

## 2019-01-01 NOTE — Procedures (Signed)
PD catheter injection No evidence of leak, widely patent EBL 0 Comp 0

## 2019-01-01 NOTE — Progress Notes (Signed)
Physical Therapy Treatment Patient Details Name: [name] MRN: [id number] DOB: 1966/01/27 Today's Date: 01/01/2019    History of Present Illness [name] is a 53 y.o. female with ESRD on PD presented to [facility] ED by EMS with complaints of 10/10 pain in bilateral legs and abdomen.  She feels like she might have a recurrence of cellulitis in her legs which she has had multiple times in the past. She reports that she had dialysis at home last night.  She reports that her legs have increased in size recently.  She has chronic lymphedema in both legs which is severe.  She reports abdominal pain in LLQ below her PD cath site.  She reports intermittent fever at home but no chills.  She denies nausea and vomiting.  She has been concerned about blood clots in legs as she is very immobile.  She tested positive for flu B and was placed on Tamiflu.  There was concern for peritonitis, patient underwent paracentesis which was negative for peritonitis    PT Comments    Pt doing much better with mobility today, ambulated 3' with RW and min-guard A which correlates to distance she has to walk to get from bathroom door to toilet. Pt able to get to get to EOB with supervision and rise from bed with min-guard A. Pt determined to get home. PT will continue to follow.    Follow Up Recommendations  Supervision for mobility/OOB;Home health PT     Equipment Recommendations  None recommended by PT    Recommendations for Other Services       Precautions / Restrictions Precautions Precautions: Fall Restrictions Weight Bearing Restrictions: No    Mobility  Bed Mobility Overal bed mobility: Needs Assistance Bed Mobility: Supine to Sit           General bed mobility comments: pt able to get to EOB with HOB 35 deg (which is what she maintains at home) and use of rails (at home she uses nightstand) with supervision but no physical assist, vc's for breathing throughout  Transfers Overall transfer  level: Needs assistance Equipment used: Rolling walker (2 wheeled) Transfers: Sit to/from Stand Sit to Stand: Min guard;+2 safety/equipment         General transfer comment: min-guard A for sit<>stand, pt uses momentum, cues for fwd translation of wt  Ambulation/Gait Ambulation/Gait assistance: Min guard;+2 safety/equipment Gait Distance (Feet): 3 Feet Assistive device: Rolling walker (2 wheeled) Gait Pattern/deviations: Wide base of support;Step-to pattern Gait velocity: decreased Gait velocity interpretation: <1.31 ft/sec, indicative of household ambulator General Gait Details: took side and back steps to chair with use of RW (though will need to practice with SPC at some point as this is what she uses in bathroom). Min-guard A given. This distance correlates to her distance in her bathroom at home   Stairs             Wheelchair Mobility    Modified Rankin (Stroke Patients Only)       Balance Overall balance assessment: Needs assistance Sitting-balance support: No upper extremity supported;Feet supported Sitting balance-Leahy Scale: Good     Standing balance support: Bilateral upper extremity supported;During functional activity Standing balance-Leahy Scale: Poor Standing balance comment: relies on rw during static standing                            Cognition Arousal/Alertness: Awake/alert Behavior During Therapy: WFL for tasks assessed/performed Overall Cognitive Status: Within Functional  Limits for tasks assessed                                        Exercises General Exercises - Lower Extremity Ankle Circles/Pumps: AROM;Both;20 reps;Supine Long Arc Quad: AROM;10 reps;Both;Seated Heel Slides: AROM;Both;10 reps;Supine Hip ABduction/ADduction: AROM;Both;10 reps;Supine Straight Leg Raises: AROM;Both;5 reps;Supine Other Exercises Other Exercises: BUE horizontal abduction for chest stretch, 1 min 2 reps Other Exercises: B  shoulder flexion 10x Other Exercises: hands behind head for 30 sec chest opener stretch    General Comments General comments (skin integrity, edema, etc.): discussed general wellness and nutrition to consider upon d/c      Pertinent Vitals/Pain Pain Assessment: Faces Faces Pain Scale: Hurts even more Pain Location: abdomen and BLE Pain Descriptors / Indicators: Aching;Discomfort;Sore Pain Intervention(s): Limited activity within patient's tolerance;Monitored during session    Home Living                      Prior Function            PT Goals (current goals can now be found in the care plan section) Acute Rehab PT Goals Patient Stated Goal: restore basic mobility to PLOF  PT Goal Formulation: With patient Time For Goal Achievement: 01/04/19 Potential to Achieve Goals: Fair Progress towards PT goals: Progressing toward goals    Frequency    Min 3X/week      PT Plan Discharge plan needs to be updated    Co-evaluation              AM-PAC PT "6 Clicks" Mobility   Outcome Measure  Help needed turning from your back to your side while in a flat bed without using bedrails?: None Help needed moving from lying on your back to sitting on the side of a flat bed without using bedrails?: None Help needed moving to and from a bed to a chair (including a wheelchair)?: A Little Help needed standing up from a chair using your arms (e.g., wheelchair or bedside chair)?: A Little Help needed to walk in hospital room?: A Little Help needed climbing 3-5 steps with a railing? : Total 6 Click Score: 18    End of Session Equipment Utilized During Treatment: Oxygen;Gait belt Activity Tolerance: Patient tolerated treatment well;Other (comment)(very high motivation) Patient left: with call bell/phone within reach;in chair Nurse Communication: Mobility status PT Visit Diagnosis: Unsteadiness on feet (R26.81);Other abnormalities of gait and mobility (R26.89);Muscle weakness  (generalized) (M62.81);Difficulty in walking, not elsewhere classified (R26.2)     Time: 0786-7544 PT Time Calculation (min) (ACUTE ONLY): 27 min  Charges:  $Gait Training: 8-22 mins $Therapeutic Exercise: 8-22 mins                     Leighton Roach, [place]  Pager [phone] Office [place] 01/01/2019, 10:59 AM

## 2019-01-02 DIAGNOSIS — E875 Hyperkalemia: Secondary | ICD-10-CM

## 2019-01-02 DIAGNOSIS — L03311 Cellulitis of abdominal wall: Secondary | ICD-10-CM

## 2019-01-02 LAB — GLUCOSE, CAPILLARY
Glucose-Capillary: 376 mg/dL — ABNORMAL HIGH (ref 70–99)
Glucose-Capillary: 289 mg/dL — ABNORMAL HIGH (ref 70–99)
Glucose-Capillary: 186 mg/dL — ABNORMAL HIGH (ref 70–99)
Glucose-Capillary: 223 mg/dL — ABNORMAL HIGH (ref 70–99)

## 2019-01-02 LAB — AEROBIC/ANAEROBIC CULTURE W GRAM STAIN (SURGICAL/DEEP WOUND): Culture: NO GROWTH

## 2019-01-02 LAB — RENAL FUNCTION PANEL
Albumin: 2.8 g/dL — ABNORMAL LOW (ref 3.5–5.0)
Anion gap: 12 (ref 5–15)
BUN: 69 mg/dL — AB (ref 6–20)
CO2: 24 mmol/L (ref 22–32)
Calcium: 8.8 mg/dL — ABNORMAL LOW (ref 8.9–10.3)
Chloride: 96 mmol/L — ABNORMAL LOW (ref 98–111)
Creatinine, Ser: 11.8 mg/dL — ABNORMAL HIGH (ref 0.44–1.00)
GFR calc Af Amer: 4 mL/min — ABNORMAL LOW (ref >60)
GFR, EST NON AFRICAN AMERICAN: 3 mL/min — AB (ref >60)
Glucose, Bld: 413 mg/dL — ABNORMAL HIGH (ref 70–99)
Phosphorus: 6.1 mg/dL — ABNORMAL HIGH (ref 2.5–4.6)
Potassium: 6.7 mmol/L (ref 3.5–5.1)
Sodium: 132 mmol/L — ABNORMAL LOW (ref 135–145)

## 2019-01-02 LAB — POTASSIUM
Potassium: 6.1 mmol/L — ABNORMAL HIGH (ref 3.5–5.1)
Potassium: 5.4 mmol/L — ABNORMAL HIGH (ref 3.5–5.1)

## 2019-01-02 LAB — CBC
HCT: 32.1 % — ABNORMAL LOW (ref 36.0–46.0)
Hemoglobin: 10.2 g/dL — ABNORMAL LOW (ref 12.0–15.0)
MCH: 32.3 pg (ref 26.0–34.0)
MCHC: 31.8 g/dL (ref 30.0–36.0)
MCV: 101.6 fL — ABNORMAL HIGH (ref 80.0–100.0)
Platelets: 295 10*3/uL (ref 150–400)
RBC: 3.16 MIL/uL — ABNORMAL LOW (ref 3.87–5.11)
RDW: 12.8 % (ref 11.5–15.5)
WBC: 10.1 10*3/uL (ref 4.0–10.5)
nRBC: 0 % (ref 0.0–0.2)

## 2019-01-02 MED ORDER — INSULIN DETEMIR 100 UNIT/ML ~~LOC~~ SOLN
15.0000 [IU] | Freq: Every day | SUBCUTANEOUS | Status: DC
  Administered 2019-01-03 – 2019-01-04 (×2): 15 [IU] via SUBCUTANEOUS
  Filled 2019-01-02 (×2): qty 0.15

## 2019-01-02 MED ORDER — SODIUM POLYSTYRENE SULFONATE 15 GM/60ML PO SUSP
15.0000 g | Freq: Once | ORAL | Status: AC
  Administered 2019-01-02: 15 g via ORAL
  Filled 2019-01-02: qty 60

## 2019-01-02 MED ORDER — DARBEPOETIN ALFA 60 MCG/0.3ML IJ SOSY
60.0000 ug | PREFILLED_SYRINGE | INTRAMUSCULAR | Status: DC
  Filled 2019-01-02: qty 0.3

## 2019-01-02 MED ORDER — HEPARIN 1000 UNIT/ML FOR PERITONEAL DIALYSIS: 500.0000 [IU] | INTRAMUSCULAR | Status: DC | PRN

## 2019-01-02 MED ORDER — CINACALCET HCL 30 MG PO TABS
60.0000 mg | ORAL_TABLET | Freq: Every day | ORAL | Status: DC
  Administered 2019-01-02 – 2019-01-04 (×3): 60 mg via ORAL
  Filled 2019-01-02 (×3): qty 2

## 2019-01-02 MED ORDER — POLYETHYLENE GLYCOL 3350 17 G PO PACK
17.0000 g | PACK | Freq: Two times a day (BID) | ORAL | Status: DC
  Administered 2019-01-02 – 2019-01-04 (×3): 17 g via ORAL
  Filled 2019-01-02 (×3): qty 1

## 2019-01-02 MED ORDER — INSULIN DETEMIR 100 UNIT/ML ~~LOC~~ SOLN
5.0000 [IU] | Freq: Once | SUBCUTANEOUS | Status: AC
  Administered 2019-01-02: 5 [IU] via SUBCUTANEOUS
  Filled 2019-01-02: qty 0.05

## 2019-01-02 MED ORDER — GENTAMICIN SULFATE 0.1 % EX CREA: 1.0000 "application " | TOPICAL_CREAM | Freq: Every day | CUTANEOUS | Status: DC

## 2019-01-02 MED ORDER — SODIUM POLYSTYRENE SULFONATE 15 GM/60ML PO SUSP
60.0000 g | Freq: Once | ORAL | Status: AC
  Administered 2019-01-02: 60 g via ORAL
  Filled 2019-01-02: qty 240

## 2019-01-02 NOTE — Progress Notes (Signed)
Called nephrologist about potassium of 6.7, and wanted to recheck potassium level. Will continue to monitor.   Farley Ly RN

## 2019-01-02 NOTE — Progress Notes (Signed)
PROGRESS NOTE    Lindsay MORRO  [name]:[id number] DOB: 06/25/1966 DOA: 12/21/2018 PCP: [name], MD     Brief Narrative:  [name] is a 53 y.o. female with ESRD on PD presented to ED by EMS with complaints of 10/10 pain in bilateral legs and abdomen.  She feels like she might have a recurrence of cellulitis in her legs which she has had multiple times in the past. She reports that she had dialysis at home last night.  She reports that her legs have increased in size recently.  She has chronic lymphedema in both legs which is severe.  She reports abdominal pain in LLQ below her PD cath site.  She reports intermittent fever at home but no chills.  She denies nausea and vomiting.  She has been concerned about blood clots in legs as she is very immobile.  She tested positive for flu B and was placed on Tamiflu.  There was concern for peritonitis, patient underwent paracentesis which was negative for peritonitis.  Nephrology was consulted.  She was started on Augmentin for left lower quadrant abdominal wall cellulitis.  CT abdomen showed fluid collection near PD catheter.  IR consulted and minimal/1 mL blood-tinged fluid aspirated which was culture negative.  PD catheter evaluated and working normally.  Currently on Keflex.  Her lower abdominal wall/panniculus findings are suspected due to volume overload.  Nephrology has changed dialysate for better volume management.  New events last 24 hours / Subjective: Patient feels that her lower abdominal swelling is better over the last day or so.  Moderate pain persists.  Denies any other complaints.  Assessment & Plan:   Principal Problem:   Influenza B Active Problems:   Lymphedema of lower extremity   Diabetes mellitus (HCC)   Morbid obesity (HCC)   HTN (hypertension)   Chronic diastolic CHF (congestive heart failure) (HCC)   ESRD on dialysis (HCC)   ESRD (end stage renal disease) (HCC)   Cor pulmonale, chronic (HCC)   Hypokalemia  Peritonitis (HCC)   Abdominal pain, LLQ  Hyperkalemia Noted potassium of 6.7 on 1/14.  Sample did not appear hemolyzed.  Repeat potassium 6.1.  No EKG changes.  Placed on telemetry.  I discussed with Dr. Deaterding, gave a dose of Kayexalate 60 g x 1, recommended no insulin or dextrose.  Already getting SSI insulin and Levemir.  She was also on potassium supplements, discontinued.  Follow repeat BMP closely.  Address as needed.  Influenza B Completed treatment with Tamiflu.  Resolved.  Nonpurulent cellulitis/panniculitis LLQ abdominal wall/subcutaneous collection on CT - Initially treated with Augmentin and then switched to Keflex.  She was not improving as expected and CT abdomen without contrast was obtained which showed subcutaneous fluid collection.  This was aspirated by IR and culture negative.  There was concern for PD catheter malfunction, underwent premedicated contrast study and PD catheter is working normally.  Skin biopsy was considered but deferred due to suspected low yield. - She has extensive Peau D'Orange changes in the suprapubic, left lower and mid abdominal quadrants. -Nephrology has changed PD fluid to 4.25% for better volume management and this seems to be helping her abdominal wall edema. -Consider completing total 2 weeks of Keflex. -Slowly improving.  Constipation Continue bowel regimen.  Having BMs.  Bilateral leg pain/chronic lymphedema DVT ultrasound was negative.  Stable without change.  ESRD on peritoneal dialysis Nephrology consulted and undergoing PD under their direction.  PD catheter evaluation as indicated above.  Patient does not  wish to go on HD.  She follows with Dr. Lowanda Foster, nephrology in [place].  Type 2 diabetes Levemir, sliding-scale insulin.  Worsening glycemic control due to steroid premedication she received for PD catheter study on 1/13 and due to new dialysate.  Increased Levemir to 15 units daily and continue NovoLog resistant SSI.  Adjust  insulins as needed.  Essential hypertension Continue Norvasc.  Controlled.  Morbid obesity Body mass index is 59.03 kg/m.   Anxiety Continue Xanax   DVT prophylaxis: Subq hep Code Status: Full Family Communication:  No family at bedside Disposition Plan: DC home pending clinical improvement and nephrology clearance.   Consultants:   Nephrology  Interventional radiology  Procedures:   PD  Aspiration of abdominal wall collection by IR on 1/9.  Antimicrobials:  Anti-infectives (From admission, onward)   Start     Dose/Rate Route Frequency Ordered Stop   12/27/18 2200  cephALEXin (KEFLEX) capsule 500 mg     500 mg Oral Every 12 hours 12/27/18 1846 01/09/19 2359   12/23/18 1200  amoxicillin-clavulanate (AUGMENTIN) 500-125 MG per tablet 500 mg  Status:  Discontinued     1 tablet Oral Every 24 hours 12/23/18 1022 12/27/18 1846   12/23/18 1015  amoxicillin-clavulanate (AUGMENTIN) 875-125 MG per tablet 1 tablet  Status:  Discontinued     1 tablet Oral Every 12 hours 12/23/18 1014 12/23/18 1021   12/22/18 1000  oseltamivir (TAMIFLU) capsule 30 mg  Status:  Discontinued     30 mg Oral Daily 12/21/18 1631 12/21/18 1738   12/21/18 1700  cefTAZidime (FORTAZ) 2 g in sodium chloride 0.9 % 100 mL IVPB  Status:  Discontinued     2 g 200 mL/hr over 30 Minutes Intravenous  Once 12/21/18 1655 12/21/18 2016   12/21/18 1700  ceFAZolin (ANCEF) IVPB 1 g/50 mL premix  Status:  Discontinued     1 g 100 mL/hr over 30 Minutes Intravenous  Once 12/21/18 1655 12/21/18 2016   12/21/18 1215  oseltamivir (TAMIFLU) capsule 30 mg     30 mg Oral  Once 12/21/18 1209 12/21/18 1307       Objective: Vitals:   01/01/19 2018 01/02/19 0425 01/02/19 0729 01/02/19 1039  BP: 139/78 125/71 117/70 128/67  Pulse: 72 61 67 68  Resp: _0 Temp: 97.6 F (36.4 C) (!) 97.5 F (36.4 C)  97.7 F (36.5 C)  TempSrc: Oral Oral    SpO2: 100% 99% 100%   Weight: (!) 160.9 kg     Height:         Intake/Output Summary (Last 24 hours) at 01/02/2019 1658 Last data filed at 01/02/2019 1210 Gross per 24 hour  Intake 363 ml  Output 100 ml  Net 263 ml   Filed Weights   12/31/18 1950 01/01/19 1939 01/01/19 2018  Weight: (!) 160.2 kg (!) 160.9 kg (!) 160.9 kg    Examination:  General exam: Pleasant young female, moderately built and morbidly obese, lying comfortably propped up in bed. Respiratory system: Clear to auscultation.  No increased work of breathing.  Stable Cardiovascular system: S1 & S2 heard, RRR. No JVD, murmurs, rubs, gallops or clicks. No pedal edema.  Telemetry personally reviewed: Sinus rhythm. Gastrointestinal system: Abdomen is obese.  Left-sided PD catheter site intact.  She has extensive Peau D'Orange changes in the suprapubic, left lower and mid abdominal quadrants but the edema seems to be better compared to yesterday.  She is tender in these areas but no erythema or warmth.  No crepitus. Central nervous system: Alert and oriented. No focal neurological deficits.  Stable. Extremities: Symmetric 5 x 5 power.  Changes of chronic severe bilateral lower extremity lymphedema without acute findings. Psychiatry: Judgement and insight appear normal. Mood & affect appropriate.    Data Reviewed: I have personally reviewed following labs and imaging studies  CBC: Recent Labs  Lab 12/27/18 0710 01/02/19 0607  WBC 11.0* 10.1  HGB 9.5* 10.2*  HCT 30.0* 32.1*  MCV 99.7 101.6*  PLT 223 196   Basic Metabolic Panel: Recent Labs  Lab 12/27/18 0710 12/28/18 1151 12/30/18 0944 01/02/19 0607 01/02/19 1004  NA 132* 133*  --  132*  --   K 4.7 3.9  --  6.7* 6.1*  CL 95* 95*  --  96*  --   CO2 25 26  --  24  --   GLUCOSE 239* 193*  --  413*  --   BUN 45* 54*  --  69*  --   CREATININE 10.97* 11.09*  --  11.80*  --   CALCIUM 8.7* 8.8*  --  8.8*  --   PHOS  --   --  5.6* 6.1*  --    GFR: Estimated Creatinine Clearance: 8.7 mL/min (A) (by C-G formula based on SCr  of 11.8 mg/dL (H)). Liver Function Tests: Recent Labs  Lab 12/27/18 0710 01/02/19 0607  AST 13*  --   ALT 15  --   ALKPHOS 91  --   BILITOT 0.7  --   PROT 6.6  --   ALBUMIN 2.4* 2.8*   CBG: Recent Labs  Lab 01/01/19 1634 01/01/19 2017 01/02/19 0727 01/02/19 1051 01/02/19 1620  GLUCAP 267* 262* 376* 289* 186*     Recent Results (from the past 240 hour(s))  Aerobic/Anaerobic Culture (surgical/deep wound)     Status: None   Collection Time: 12/28/18 10:53 AM  Result Value Ref Range Status   Specimen Description FLUID  Final   Special Requests NONE  Final   Gram Stain   Final    RARE WBC PRESENT, PREDOMINANTLY MONONUCLEAR NO ORGANISMS SEEN    Culture   Final    No growth aerobically or anaerobically. Performed at [facility], [place] [address], [place], [place] [address]    Report Status 01/02/2019 FINAL  Final  Fungus Culture With Stain     Status: None (Preliminary result)   Collection Time: 12/28/18 10:54 AM  Result Value Ref Range Status   Fungus Stain Final report  Final    Comment: (NOTE) Performed At: [facility] [place], [place] [id number] Rush Farmer MD [id number]    Fungus (Mycology) Culture PENDING  Incomplete   Fungal Source FLUID  Final  Fungus Culture Result     Status: None   Collection Time: 12/28/18 10:54 AM  Result Value Ref Range Status   Result 1 Comment  Final    Comment: (NOTE) KOH/Calcofluor preparation:  no fungus observed. Performed At: [facility] [place], [place] [id number] Rush Farmer MD ZC:[id number]        Radiology Studies: Ir Sinus/fist Tube Chk-non Gi  Result Date: 01/01/2019 INDICATION: Evaluate left lower quadrant peritoneal catheter for leak EXAM: SINUS TRACT INJECTION / FISTULOGRAM COMPARISON:  None. MEDICATIONS: The patient is currently admitted to the hospital and receiving intravenous antibiotics. The antibiotics were administered within an appropriate  time frame prior to the initiation of the procedure. ANESTHESIA/SEDATION: None COMPLICATIONS: None immediate. TECHNIQUE: Informed written consent was obtained  from the patient after a thorough discussion of the procedural risks, benefits and alternatives. All questions were addressed. Maximal Sterile Barrier Technique was utilized including caps, mask, sterile gowns, sterile gloves, sterile drape, hand hygiene and skin antiseptic. A timeout was performed prior to the initiation of the procedure. The patient was pre-medicated with cortical steroids and Benadryl. PROCEDURE: Contrast was injected into the peritoneal catheter and imaging was performed in multiple views. Contrast was then aspirated. The catheter was again flushed with saline. FINDINGS: The peritoneal dialysis catheter is positioned in the left paracolic gutter. It is widely patent. Fluid enters the left paracolic gutters. There is no evidence of contrast extravasation into the peritoneal fat to suggest a leak. IMPRESSION: The left lower quadrant peritoneal catheter is functioning normally, is patent, and there is no evidence of leak. Electronically Signed   By: Marybelle Killings M.D.   On: 01/01/2019 09:29      Scheduled Meds: . cephALEXin  500 mg Oral Q12H  . cinacalcet  60 mg Oral Q supper  . darbepoetin (ARANESP) injection - DIALYSIS  60 mcg Subcutaneous Q Tue-HD  . gentamicin cream  1 application Topical Daily  . heparin  5,000 Units Subcutaneous Q8H  . insulin aspart  0-20 Units Subcutaneous TID WC  . [START ON 01/03/2019] insulin detemir  15 Units Subcutaneous Daily  . lanthanum  1,500 mg Oral TID WC  . mouth rinse  15 mL Mouth Rinse BID  . multivitamin  1 tablet Oral Daily  . polyethylene glycol  17 g Oral BID  . senna-docusate  1 tablet Oral BID  . sodium chloride flush  3 mL Intravenous Q12H   Continuous Infusions: . sodium chloride       LOS: 12 days    Vernell Leep, MD, FACP, [facility]. [facility] Pager  [phone]  If 7PM-7AM, please contact night-coverage [email or web address] Password [facility] 01/02/2019, 4:58 PM

## 2019-01-02 NOTE — Progress Notes (Signed)
Subjective: Interval History: has no complaint, feels better with less fluid..  Objective: Vital signs in last 24 hours: Temp:  [97.5 F (36.4 C)-98.5 F (36.9 C)] 97.7 F (36.5 C) (01/14 1039) Pulse Rate:  [61-72] 68 (01/14 1039) Resp:  [18] 18 (01/14 0729) BP: (117-148)/(63-78) 128/67 (01/14 1039) SpO2:  [96 %-100 %] 100 % (01/14 0729) Weight:  [160.9 kg] 160.9 kg (01/13 2018) Weight change: 1 kg  Intake/Output from previous day: 01/13 0701 - 01/14 0700 In: 863 [P.O.:860; I.V.:3] Out: 0  Intake/Output this shift: Total I/O In: 240 [P.O.:240] Out: -   General appearance: alert, cooperative, no distress and morbidly obese Resp: diminished breath sounds bilaterally Cardio: S1, S2 normal and systolic murmur: systolic ejection 2/6, crescendo and decrescendo at 2nd left intercostal space GI: massive, PD cath L mid abdm.  tender but less with induration LLQ, Peau d orange appearance Extremities: edema 3+ edema,  and lymphedema LLE  Lab Results: Recent Labs    01/02/19 0607  WBC 10.1  HGB 10.2*  HCT 32.1*  PLT 295   BMET:  Recent Labs    01/02/19 0607 01/02/19 1004  NA 132*  --   K 6.7* 6.1*  CL 96*  --   CO2 24  --   GLUCOSE 413*  --   BUN 69*  --   CREATININE 11.80*  --   CALCIUM 8.8*  --    Recent Labs    12/31/18 0559  PTH 376*   Iron Studies: No results for input(s): IRON, TIBC, TRANSFERRIN, FERRITIN in the last 72 hours.  Studies/Results: Ir Sinus/fist Tube Chk-non Gi  Result Date: 01/01/2019 INDICATION: Evaluate left lower quadrant peritoneal catheter for leak EXAM: SINUS TRACT INJECTION / FISTULOGRAM COMPARISON:  None. MEDICATIONS: The patient is currently admitted to the hospital and receiving intravenous antibiotics. The antibiotics were administered within an appropriate time frame prior to the initiation of the procedure. ANESTHESIA/SEDATION: None COMPLICATIONS: None immediate. TECHNIQUE: Informed written consent was obtained from the patient  after a thorough discussion of the procedural risks, benefits and alternatives. All questions were addressed. Maximal Sterile Barrier Technique was utilized including caps, mask, sterile gowns, sterile gloves, sterile drape, hand hygiene and skin antiseptic. A timeout was performed prior to the initiation of the procedure. The patient was pre-medicated with cortical steroids and Benadryl. PROCEDURE: Contrast was injected into the peritoneal catheter and imaging was performed in multiple views. Contrast was then aspirated. The catheter was again flushed with saline. FINDINGS: The peritoneal dialysis catheter is positioned in the left paracolic gutter. It is widely patent. Fluid enters the left paracolic gutters. There is no evidence of contrast extravasation into the peritoneal fat to suggest a leak. IMPRESSION: The left lower quadrant peritoneal catheter is functioning normally, is patent, and there is no evidence of leak. Electronically Signed   By: Marybelle Killings M.D.   On: 01/01/2019 09:29    I have reviewed the patient's current medications.  Assessment/Plan: 1 ESRD losing vol with CCPD. Use 4.25%.  K ^ , tx., stop po.  ? Hemolysis. 2 DM ^ with steroids and 4.25  3 Massive obesity 4 Anemia start esa 5 HPTH use Cinn 6 OSA P cinn, 4.25, aran   LOS: 12 days   [name] 01/02/2019,11:05 AM

## 2019-01-02 NOTE — Progress Notes (Signed)
CRITICAL VALUE ALERT  Critical Value: Potassium-6.7  Date & Time Notied: 01/02/2019  0710  Provider Notified: A. Hongalgi, MD  Orders Received/Actions taken: awaiting orders

## 2019-01-02 NOTE — Progress Notes (Signed)
Asked by MD to calculate stop date for antibiotics. Current antibiotic, Keflex, added on 12/27/18. MD wishes to treat for 14 days. Would stop Keflex on 01/09/19 to complete 14 days.   Thank you,   Hermie Reagor A. Levada Dy, PharmD, [place] Pager: [phone] Please utilize Amion for appropriate phone number to reach the unit pharmacist ([place])

## 2019-01-03 LAB — RENAL FUNCTION PANEL
Albumin: 2.7 g/dL — ABNORMAL LOW (ref 3.5–5.0)
Anion gap: 15 (ref 5–15)
BUN: 66 mg/dL — ABNORMAL HIGH (ref 6–20)
CO2: 25 mmol/L (ref 22–32)
Calcium: 8.4 mg/dL — ABNORMAL LOW (ref 8.9–10.3)
Chloride: 96 mmol/L — ABNORMAL LOW (ref 98–111)
Creatinine, Ser: 11.42 mg/dL — ABNORMAL HIGH (ref 0.44–1.00)
GFR calc Af Amer: 4 mL/min — ABNORMAL LOW (ref >60)
GFR calc non Af Amer: 3 mL/min — ABNORMAL LOW (ref >60)
Glucose, Bld: 251 mg/dL — ABNORMAL HIGH (ref 70–99)
Phosphorus: 6.1 mg/dL — ABNORMAL HIGH (ref 2.5–4.6)
Potassium: 4 mmol/L (ref 3.5–5.1)
Sodium: 136 mmol/L (ref 135–145)

## 2019-01-03 LAB — GLUCOSE, CAPILLARY
Glucose-Capillary: 224 mg/dL — ABNORMAL HIGH (ref 70–99)
Glucose-Capillary: 159 mg/dL — ABNORMAL HIGH (ref 70–99)
Glucose-Capillary: 99 mg/dL (ref 70–99)
Glucose-Capillary: 166 mg/dL — ABNORMAL HIGH (ref 70–99)

## 2019-01-03 MED ORDER — LANTHANUM CARBONATE 500 MG PO CHEW
2000.0000 mg | CHEWABLE_TABLET | Freq: Three times a day (TID) | ORAL | Status: DC
  Administered 2019-01-03 – 2019-01-04 (×5): 2000 mg via ORAL
  Filled 2019-01-03 (×6): qty 4

## 2019-01-03 MED ORDER — DARBEPOETIN ALFA 60 MCG/0.3ML IJ SOSY
60.0000 ug | PREFILLED_SYRINGE | INTRAMUSCULAR | Status: DC
  Administered 2019-01-03: 60 ug via SUBCUTANEOUS
  Filled 2019-01-03: qty 0.3

## 2019-01-03 MED ORDER — GENTAMICIN SULFATE 0.1 % EX CREA
1.0000 "application " | TOPICAL_CREAM | Freq: Every day | CUTANEOUS | Status: DC
  Administered 2019-01-03 (×2): 1 via TOPICAL
  Filled 2019-01-03: qty 15

## 2019-01-03 MED ORDER — HEPARIN 1000 UNIT/ML FOR PERITONEAL DIALYSIS: 500.0000 [IU] | INTRAMUSCULAR | Status: DC | PRN

## 2019-01-03 NOTE — Progress Notes (Signed)
Physical Therapy Treatment Patient Details Name: [name] MRN: [id number] DOB: December 16, 1966 Today's Date: 01/03/2019    History of Present Illness [name] is a 53 y.o. female with ESRD on PD admitted with LE and abominal pain with concern for blood clots.  Found to be positive for influenza B and was tested, but negative for peritonitis.    PT Comments    Patient progressing with mobility this session able to participate with one assist.  Remains limited as previous to admission on distance, but mobilizes in her power or manual chair mostly except trips to bathroom.  Feel she will benefit from follow up PT as noted.  Will follow acutely until d/c.    Follow Up Recommendations  Supervision for mobility/OOB;Home health PT(HH aide)     Equipment Recommendations  None recommended by PT    Recommendations for Other Services       Precautions / Restrictions Precautions Precautions: Fall Restrictions Weight Bearing Restrictions: No    Mobility  Bed Mobility   Bed Mobility: Supine to Sit     Supine to sit: Supervision;HOB elevated     General bed mobility comments: able to get legs off bed with increased time, HOB elevated and used railings  Transfers Overall transfer level: Needs assistance Equipment used: Rolling walker (2 wheeled) Transfers: Sit to/from Stand Sit to Stand: Min guard         General transfer comment: up from EOB elevated to simulate height of bed at home; assist to stabilize walker due to has walker with brakes at home  Ambulation/Gait Ambulation/Gait assistance: Min guard Gait Distance (Feet): 3 Feet Assistive device: Rolling walker (2 wheeled) Gait Pattern/deviations: Step-to pattern;Decreased stride length;Trunk flexed     General Gait Details: moved bed to chair (in the corner on side of bed she sat up on); uses SPC at home but stailizes with other hand on walls/furniture/etc   Stairs             Wheelchair Mobility     Modified Rankin (Stroke Patients Only)       Balance Overall balance assessment: Needs assistance Sitting-balance support: No upper extremity supported;Feet supported Sitting balance-Leahy Scale: Good     Standing balance support: Bilateral upper extremity supported;During functional activity Standing balance-Leahy Scale: Poor Standing balance comment: relies on rw during static standing                            Cognition Arousal/Alertness: Awake/alert Behavior During Therapy: WFL for tasks assessed/performed Overall Cognitive Status: Within Functional Limits for tasks assessed                                        Exercises      General Comments General comments (skin integrity, edema, etc.): patient able to recall exercises she normally does for LE's and UE's and reports has sister or grandaughter to help with bath, but open to HHPT/aide at d/c.       Pertinent Vitals/Pain Pain Assessment: Faces Faces Pain Scale: Hurts little more Pain Location: reports chronic pain in LE's Pain Descriptors / Indicators: Discomfort;Sore Pain Intervention(s): Monitored during session;Repositioned    Home Living                      Prior Function  PT Goals (current goals can now be found in the care plan section) Progress towards PT goals: Progressing toward goals    Frequency    Min 3X/week      PT Plan Current plan remains appropriate    Co-evaluation              AM-PAC PT "6 Clicks" Mobility   Outcome Measure  Help needed turning from your back to your side while in a flat bed without using bedrails?: A Little Help needed moving from lying on your back to sitting on the side of a flat bed without using bedrails?: A Little Help needed moving to and from a bed to a chair (including a wheelchair)?: A Little Help needed standing up from a chair using your arms (e.g., wheelchair or bedside chair)?: A Little Help  needed to walk in hospital room?: A Little Help needed climbing 3-5 steps with a railing? : Total 6 Click Score: 16    End of Session Equipment Utilized During Treatment: Oxygen Activity Tolerance: Patient tolerated treatment well Patient left: with call bell/phone within reach;in chair Nurse Communication: Mobility status PT Visit Diagnosis: Unsteadiness on feet (R26.81);Other abnormalities of gait and mobility (R26.89);Muscle weakness (generalized) (M62.81);Difficulty in walking, not elsewhere classified (R26.2)     Time: 1003-1020 PT Time Calculation (min) (ACUTE ONLY): 17 min  Charges:  $Gait Training: 8-22 mins                     Magda Kiel, [place] [phone] 01/03/2019    Reginia Naas 01/03/2019, 11:46 AM

## 2019-01-03 NOTE — Progress Notes (Signed)
OT Cancellation Note  Patient Details Name: [name] MRN: [id number] DOB: 24-Oct-1966   Cancelled Treatment:    Reason Eval/Treat Not Completed: Other (comment). Two attempts today: fatigued earlier this morning, reports she did not sleep well last night due to bowel medication taking effect. Second attempt pt eating lunch. Will try to reattempt either later this afternoon or tomorrow.  Tyrone Schimke, OT Acute Rehabilitation Services Pager: [phone] Office: [phone]  01/03/2019, 11:59 AM

## 2019-01-03 NOTE — Progress Notes (Signed)
PROGRESS NOTE    MALISA RUGGIERO  JGG:[id number] DOB: 01-22-1966 DOA: 12/21/2018 PCP: [name], MD    Brief Narrative:  53 year old female who presented with bilateral leg pain and abdominal pain, she does have significant past medical history for end-stage renal disease on peritoneal dialysis, chronic lymphedema, recurrent lower extremity cellulitis.  Patient reported left lower quadrant abdominal pain below her peritoneal dialysis catheter.  Associated with intermittent fevers.  On her initial physical examination blood pressure 106/59, heart rate 72, respiratory 21, oxygen saturation 93%, her lungs are clear to auscultation bilaterally, heart S1-S2 present and rhythmic, her abdomen was obese, her PD catheter appeared clean with no signs of local infection, positive tenderness in the left lower quadrant, bowel sounds present.  Severe lymphedema bilateral extremities.   Patient was admitted to the hospital with with a working diagnosis of abdominal pain to rule out peritonitis.    Peritoneal fluid was negative for infection, her influenza B test was positive and patient was placed on oseltamivir.  Further work-up revealed a fluid collection near the PD catheter.  IR was consulted and lesion was drained.  Assessment & Plan:   Principal Problem:   Influenza B Active Problems:   Lymphedema of lower extremity   Diabetes mellitus (HCC)   Morbid obesity (HCC)   HTN (hypertension)   Chronic diastolic CHF (congestive heart failure) (HCC)   ESRD on dialysis (HCC)   ESRD (end stage renal disease) (HCC)   Cor pulmonale, chronic (HCC)   Hypokalemia   Peritonitis (HCC)   Abdominal pain, LLQ   1.  Nonpurulent cellulitis, panniculitis, abdominal wall abscess. (present on admission). Slowly improving, not yet back to baseline, will continue antibiotic therapy with oral cephalexin.   2. ESRD on PD, with hypervolemia and hyperkalemia. Patient continue to be volume overload, will continue to  follow on nephrology recommendations. On sensipiar and fosrenol for metabolic bone disease, and aranesp for anemia of chronic renal disease. K has improved this am.   3. T2Dm. Will continue glucose cover and monitoring with insulin sliding scale, continue basal insulin with detemir 15 units. Patient is tolerating po well.   4. Chronic lymphedema. Patient is non ambulatory, continue to be in high risk for skin infections.    DVT prophylaxis: heparin   Code Status: full Family Communication: no family at the bedside  Disposition Plan/ discharge barriers: pending clinical improvement and nephrology final recommendations.   Body mass index is 59.29 kg/m. Malnutrition Type:      Malnutrition Characteristics:      Nutrition Interventions:     RN Pressure Injury Documentation:     Consultants:   Nephrology   Procedures:     Antimicrobials:   cephalexin     Subjective: Patient reports improvement in abdominal pain but not back to baseline yet, edema continue to improve but still very significant. No nausea or vomiting. Has limited mobility, uses a wheelchair at home.   Objective: Vitals:   01/02/19 1039 01/02/19 1831 01/02/19 2035 01/03/19 0509  BP: 128/67 128/72 132/85 114/64  Pulse: 68 69 73 68  Resp: _0 Temp: 97.7 F (36.5 C) 98.3 F (36.8 C) 98.2 F (36.8 C) 97.9 F (36.6 C)  TempSrc:   Oral Oral  SpO2:  100% 99% 94%  Weight:  (!) 161.6 kg (!) 161.6 kg   Height:        Intake/Output Summary (Last 24 hours) at 01/03/2019 0825 Last data filed at 01/03/2019 0509 Gross per 24 hour  Intake 1480 ml  Output 225 ml  Net 1255 ml   Filed Weights   01/01/19 2018 01/02/19 1831 01/02/19 2035  Weight: (!) 160.9 kg (!) 161.6 kg (!) 161.6 kg    Examination:   General: deconditioned  Neurology: Awake and alert, non focal  E ENT: mild pallor, no icterus, oral mucosa moist Cardiovascular: No JVD. S1-S2 present, rhythmic, no gallops, rubs, or murmurs.  ++++ lyphedema lower extremity edema. Pulmonary: Positive breath sounds bilaterally, adequate air movement, no wheezing, rhonchi or rales. Gastrointestinal. Abdomen protuberant with no organomegaly,  no rebound or guarding. Tender to palpation, firm skin, and discoloration. No guarding. PD catheter in place.  Skin. No rashes Musculoskeletal: no joint deformities     Data Reviewed: I have personally reviewed following labs and imaging studies  CBC: Recent Labs  Lab 01/02/19 0607  WBC 10.1  HGB 10.2*  HCT 32.1*  MCV 101.6*  PLT 956   Basic Metabolic Panel: Recent Labs  Lab 12/28/18 1151 12/30/18 0944 01/02/19 0607 01/02/19 1004 01/02/19 1828 01/03/19 0547  NA 133*  --  132*  --   --  136  K 3.9  --  6.7* 6.1* 5.4* 4.0  CL 95*  --  96*  --   --  96*  CO2 26  --  24  --   --  25  GLUCOSE 193*  --  413*  --   --  251*  BUN 54*  --  69*  --   --  66*  CREATININE 11.09*  --  11.80*  --   --  11.42*  CALCIUM 8.8*  --  8.8*  --   --  8.4*  PHOS  --  5.6* 6.1*  --   --  6.1*   GFR: Estimated Creatinine Clearance: 9 mL/min (A) (by C-G formula based on SCr of 11.42 mg/dL (H)). Liver Function Tests: Recent Labs  Lab 01/02/19 0607 01/03/19 0547  ALBUMIN 2.8* 2.7*   No results for input(s): LIPASE, AMYLASE in the last 168 hours. No results for input(s): AMMONIA in the last 168 hours. Coagulation Profile: No results for input(s): INR, PROTIME in the last 168 hours. Cardiac Enzymes: No results for input(s): CKTOTAL, CKMB, CKMBINDEX, TROPONINI in the last 168 hours. BNP (last 3 results) No results for input(s): PROBNP in the last 8760 hours. HbA1C: No results for input(s): HGBA1C in the last 72 hours. CBG: Recent Labs  Lab 01/02/19 0727 01/02/19 1051 01/02/19 1620 01/02/19 2037 01/03/19 0737  GLUCAP 376* 289* 186* 223* 224*   Lipid Profile: No results for input(s): CHOL, HDL, LDLCALC, TRIG, CHOLHDL, LDLDIRECT in the last 72 hours. Thyroid Function Tests: No  results for input(s): TSH, T4TOTAL, FREET4, T3FREE, THYROIDAB in the last 72 hours. Anemia Panel: No results for input(s): VITAMINB12, FOLATE, FERRITIN, TIBC, IRON, RETICCTPCT in the last 72 hours.    Radiology Studies: I have reviewed all of the imaging during this hospital visit personally     Scheduled Meds: . cephALEXin  500 mg Oral Q12H  . cinacalcet  60 mg Oral Q supper  . darbepoetin (ARANESP) injection - DIALYSIS  60 mcg Subcutaneous Q Tue-HD  . gentamicin cream  1 application Topical Daily  . heparin  5,000 Units Subcutaneous Q8H  . insulin aspart  0-20 Units Subcutaneous TID WC  . insulin detemir  15 Units Subcutaneous Daily  . lanthanum  1,500 mg Oral TID WC  . mouth rinse  15 mL Mouth Rinse BID  . multivitamin  1  tablet Oral Daily  . polyethylene glycol  17 g Oral BID  . senna-docusate  1 tablet Oral BID  . sodium chloride flush  3 mL Intravenous Q12H   Continuous Infusions: . sodium chloride       LOS: 13 days        Cherly Erno Gerome Apley, MD [facility] Pager [phone]

## 2019-01-04 LAB — CBC WITH DIFFERENTIAL/PLATELET
Abs Immature Granulocytes: 0.11 10*3/uL — ABNORMAL HIGH (ref 0.00–0.07)
Basophils Absolute: 0 10*3/uL (ref 0.0–0.1)
Basophils Relative: 0 %
Eosinophils Absolute: 0.3 10*3/uL (ref 0.0–0.5)
Eosinophils Relative: 2 %
HCT: 31.6 % — ABNORMAL LOW (ref 36.0–46.0)
Hemoglobin: 9.9 g/dL — ABNORMAL LOW (ref 12.0–15.0)
Immature Granulocytes: 1 %
LYMPHS ABS: 2.6 10*3/uL (ref 0.7–4.0)
Lymphocytes Relative: 25 %
MCH: 31.5 pg (ref 26.0–34.0)
MCHC: 31.3 g/dL (ref 30.0–36.0)
MCV: 100.6 fL — ABNORMAL HIGH (ref 80.0–100.0)
MONOS PCT: 9 %
Monocytes Absolute: 0.9 10*3/uL (ref 0.1–1.0)
Neutro Abs: 6.3 10*3/uL (ref 1.7–7.7)
Neutrophils Relative %: 63 %
Platelets: 307 10*3/uL (ref 150–400)
RBC: 3.14 MIL/uL — ABNORMAL LOW (ref 3.87–5.11)
RDW: 13.2 % (ref 11.5–15.5)
WBC: 10.2 10*3/uL (ref 4.0–10.5)
nRBC: 0 % (ref 0.0–0.2)

## 2019-01-04 LAB — GLUCOSE, CAPILLARY
Glucose-Capillary: 143 mg/dL — ABNORMAL HIGH (ref 70–99)
Glucose-Capillary: 105 mg/dL — ABNORMAL HIGH (ref 70–99)
Glucose-Capillary: 101 mg/dL — ABNORMAL HIGH (ref 70–99)

## 2019-01-04 LAB — RENAL FUNCTION PANEL
Albumin: 2.6 g/dL — ABNORMAL LOW (ref 3.5–5.0)
Anion gap: 15 (ref 5–15)
BUN: 63 mg/dL — ABNORMAL HIGH (ref 6–20)
CHLORIDE: 97 mmol/L — AB (ref 98–111)
CO2: 28 mmol/L (ref 22–32)
Calcium: 8 mg/dL — ABNORMAL LOW (ref 8.9–10.3)
Creatinine, Ser: 11.45 mg/dL — ABNORMAL HIGH (ref 0.44–1.00)
GFR calc Af Amer: 4 mL/min — ABNORMAL LOW (ref >60)
GFR calc non Af Amer: 3 mL/min — ABNORMAL LOW (ref >60)
Glucose, Bld: 169 mg/dL — ABNORMAL HIGH (ref 70–99)
Phosphorus: 5.9 mg/dL — ABNORMAL HIGH (ref 2.5–4.6)
Potassium: 4 mmol/L (ref 3.5–5.1)
Sodium: 140 mmol/L (ref 135–145)

## 2019-01-04 MED ORDER — POLYETHYLENE GLYCOL 3350 17 G PO PACK: 17.0000 g | PACK | Freq: Two times a day (BID) | ORAL | 0 refills | Status: AC

## 2019-01-04 MED ORDER — CEPHALEXIN 500 MG PO CAPS: 500.0000 mg | ORAL_CAPSULE | Freq: Two times a day (BID) | ORAL | 0 refills | Status: AC

## 2019-01-04 NOTE — Discharge Summary (Signed)
Physician Discharge Summary  YOALI CONRY HQP:[id number] DOB: June 07, 1966 DOA: 12/21/2018  PCP: [name], MD  Admit date: 12/21/2018 Discharge date: 01/04/2019  Admitted From: Home  Disposition:  Home   Recommendations for Outpatient Follow-up and new medication changes:  1. Follow up with Dr. [name] in 7 days.  2. Follow up with Dr. Lowanda Foster in 7 days.  3. Patient will continue 6 more days of cephalexin for abdominal wall cellulitis.  4. Continue follow up with nephrology, patient continue to be hypervolemic, and will need further titration of dialysis to target negative fluid balance.   Home Health:  Yes  Equipment/Devices: na    Discharge Condition: stable  CODE STATUS: full  Diet recommendation: heart healthy, diabetic and renal prudent.   Brief/Interim Summary: 53 year old female who presented with bilateral leg pain and abdominal pain, she does have significant past medical history for end-stage renal disease on peritoneal dialysis, chronic lymphedema, recurrent lower extremity cellulitis.  Patient reported left lower quadrant abdominal pain below her peritoneal dialysis catheter.  Associated with intermittent fevers.  On her initial physical examination blood pressure 106/59, heart rate 72, respiratory rate was 21, oxygen saturation 93%, her lungs were clear to auscultation bilaterally, heart S1-S2 present and rhythmic, her abdomen was obese, her PD catheter appeared clean with no signs of local infection, positive tenderness in the left lower quadrant, bowel sounds present.  Severe lymphedema bilateral extremities.   Sodium 132, potassium 2.3, chloride 91, bicarb 26, glucose 200, BUN 42, creatinine 9.7, magnesium 1.7, white count 10.8, hemoglobin 10.2, hematocrit 33.5, platelets 219.  Her chest x-ray had a left rotation, increase interstitial markings bilaterally with vascular congestion.  Her EKG had normal sinus rhythm, manually corrected QT 439.   Patient was admitted to  the hospital with with a working diagnosis of abdominal pain to rule out peritonitis.    Peritoneal fluid was negative for infection, her influenza B test was positive and patient was placed on oseltamivir.  Further work-up revealed a fluid collection near the PD catheter.  IR was consulted and lesion was drained.  1.  Non-a purulent abdominal wall cellulitis, panniculitis, abdominal wall abscess (present on admission).  Patient was treated with antibiotic therapy, initially with Augmentin and then switched to cephalexin.  Collection was successfully aspirated by interventional radiology, cultures have been no growth.  Patient will continue antibiotic therapy for 6 more days.  2.  Influenza B.  Present on admission.  Patient received oseltamavir with no major complications.  3.  End-stage renal disease on peritoneal dialysis with hypo and hyperkalemia.  Patient had peritoneal fluid analyzed, it was negative for infection, catheter was reviewed with a contrast study and was determined to be working appropriately.  She was found to be in volume overload, her peritoneal dialysis was adjusted to promote negative fluid balance.  Discharge continue with mild hyperkalemia, recommend continue follow-up as an outpatient in [place].  4.  Type 2 diabetes mellitus.  Patient was placed on insulin sliding scale for glucose coverage and monitoring along with basal insulin, her capillary glucose remained well controlled.  5.  Hypertension.  During her hospitalization her antihypertensive agents were held, at discharge she will resume amlodipine.   6.  Morbid obesity/complicated by chronic lymphedema.  Her calculated BMI is 59.2, continue follow-up as an outpatient.  7.  COPD with chronic hypoxic respiratory failure.  No signs of exacerbation, continue home oxygen, 2 to 3 L per nasal cannula.   Discharge Diagnoses:  Principal Problem:   Influenza  B Active Problems:   Lymphedema of lower extremity    Diabetes mellitus (HCC)   Morbid obesity (HCC)   HTN (hypertension)   Chronic diastolic CHF (congestive heart failure) (HCC)   ESRD on dialysis (HCC)   ESRD (end stage renal disease) (HCC)   Cor pulmonale, chronic (HCC)   Hypokalemia   Peritonitis (HCC)   Abdominal pain, LLQ    Discharge Instructions   Allergies as of 01/04/2019      Reactions   Contrast Media [iodinated Diagnostic Agents] Anaphylaxis, Hives, Swelling, Other (See Comments)   Dye for cardiac cath. Tongue swells   Pneumococcal Vaccines Swelling, Other (See Comments)   Turns skin black, and bodily swelling   Vancomycin Nausea And Vomiting, Other (See Comments)   Infusion "made me feel like I was dying" had to be readmitted to hospital      Medication List    TAKE these medications   acetaminophen 325 MG tablet Commonly known as:  TYLENOL Take 650 mg by mouth every 6 (six) hours as needed for moderate pain or fever.   albuterol (2.5 MG/3ML) 0.083% nebulizer solution Commonly known as:  PROVENTIL Take 2.5 mg by nebulization every 6 (six) hours as needed for wheezing or shortness of breath.   ALPRAZolam 0.5 MG tablet Commonly known as:  XANAX Take 0.5 mg by mouth 3 (three) times daily as needed for anxiety or sleep.   amLODipine 10 MG tablet Commonly known as:  NORVASC Take 0.5 tablets (5 mg total) by mouth daily.   blood glucose meter kit and supplies Kit Dispense based on patient and insurance preference. Use up to four times daily as directed. (FOR ICD-9 250.00, 250.01).   cephALEXin 500 MG capsule Commonly known as:  KEFLEX Take 1 capsule (500 mg total) by mouth every 12 (twelve) hours for 6 days.   diphenhydrAMINE 25 mg capsule Commonly known as:  BENADRYL Take 25 mg by mouth every 6 (six) hours as needed for allergies.   insulin detemir 100 UNIT/ML injection Commonly known as:  LEVEMIR Inject 10 Units into the skin daily.   lanthanum 500 MG chewable tablet Commonly known as:  FOSRENOL Chew  1,000-1,500 mg by mouth 5 (five) times daily. Patient takes 3 tablets by mouth three times a day with meals and 2 tablets by mouth twice a day with snacks   multivitamin Tabs tablet Take 1 tablet by mouth daily.   Oxycodone HCl 10 MG Tabs Take 1 tablet (10 mg total) by mouth 4 (four) times daily as needed (pain).   polyethylene glycol packet Commonly known as:  MIRALAX / GLYCOLAX Take 17 g by mouth 2 (two) times daily for 30 days.   Potassium Chloride ER 20 MEQ Tbcr Take 40 mEq by mouth daily.      Follow-up Information    [facility]   Specialty:  [place] Why:  physical therapy and occupational therapy Contact information: [place] [address] [phone]          Allergies  Allergen Reactions  . Contrast Media [Iodinated Diagnostic Agents] Anaphylaxis, Hives, Swelling and Other (See Comments)    Dye for cardiac cath. Tongue swells  . Pneumococcal Vaccines Swelling and Other (See Comments)    Turns skin black, and bodily swelling  . Vancomycin Nausea And Vomiting and Other (See Comments)    Infusion "made me feel like I was dying" had to be readmitted to hospital    Consultations:  Nephrology  IR   Procedures/Studies: Ct Abdomen Pelvis Wo Contrast  Result Date: 12/27/2018 CLINICAL DATA:  Worsening abdominal wall cellulitis. EXAM: CT ABDOMEN AND PELVIS WITHOUT CONTRAST TECHNIQUE: Multidetector CT imaging of the abdomen and pelvis was performed following the standard protocol without IV contrast. COMPARISON:  07/14/17 FINDINGS: Lower chest:  Mild atelectasis. Hepatobiliary: No focal liver abnormality.No evidence of biliary obstruction or stone. Pancreas: Unremarkable. Spleen: Unremarkable. Adrenals/Urinary Tract: Negative adrenals. No hydronephrosis or stone. Unremarkable bladder. Stomach/Bowel:  No obstruction. No appendicitis. Vascular/Lymphatic: No acute vascular abnormality. No mass or adenopathy.  Reproductive:Hysterectomy Other: Left eccentric abdominal wall skin thickening and subcutaneous reticulation. The wall can not be completely visualized due to patient size. There is a 2.4 cm low-density collection along the patient's left-sided Tenckhoff catheter, just superficial to the muscular wall. No soft tissue emphysema. Trace perihepatic ascites. Musculoskeletal: No acute abnormalities. IMPRESSION: 1. Known left-sided cellulitis/panniculitis. The abdominal wall could not be completely visualized due to patient body habitus. In the region of inflammation is a 2.4 cm deep subcutaneous collection along the Tenckhoff catheter. No soft tissue emphysema. 2. No acute intra-abdominal finding. Electronically Signed   By: Monte Fantasia M.D.   On: 12/27/2018 05:00   Ir Sinus/fist Tube Chk-non Gi  Result Date: 01/01/2019 INDICATION: Evaluate left lower quadrant peritoneal catheter for leak EXAM: SINUS TRACT INJECTION / FISTULOGRAM COMPARISON:  None. MEDICATIONS: The patient is currently admitted to the hospital and receiving intravenous antibiotics. The antibiotics were administered within an appropriate time frame prior to the initiation of the procedure. ANESTHESIA/SEDATION: None COMPLICATIONS: None immediate. TECHNIQUE: Informed written consent was obtained from the patient after a thorough discussion of the procedural risks, benefits and alternatives. All questions were addressed. Maximal Sterile Barrier Technique was utilized including caps, mask, sterile gowns, sterile gloves, sterile drape, hand hygiene and skin antiseptic. A timeout was performed prior to the initiation of the procedure. The patient was pre-medicated with cortical steroids and Benadryl. PROCEDURE: Contrast was injected into the peritoneal catheter and imaging was performed in multiple views. Contrast was then aspirated. The catheter was again flushed with saline. FINDINGS: The peritoneal dialysis catheter is positioned in the left  paracolic gutter. It is widely patent. Fluid enters the left paracolic gutters. There is no evidence of contrast extravasation into the peritoneal fat to suggest a leak. IMPRESSION: The left lower quadrant peritoneal catheter is functioning normally, is patent, and there is no evidence of leak. Electronically Signed   By: Marybelle Killings M.D.   On: 01/01/2019 09:29   Ir US Guide Bx Asp/drain  Result Date: 12/28/2018 INDICATION: 53 year old with end-stage renal disease and left abdominal peritoneal dialysis catheter. Patient has cellulitis in the left lower abdomen and a small fluid collection around the PD catheter in the deep subcutaneous tissues. Request for a PD catheter injection and aspiration of the peri-catheter fluid. EXAM: ULTRASOUND-GUIDED ASPIRATION OF THE ABDOMINAL SUBCUTANEOUS FLUID COLLECTION MEDICATIONS: None ANESTHESIA/SEDATION: None COMPLICATIONS: None immediate. PROCEDURE: Informed written consent was obtained from the patient after a thorough discussion of the procedural risks, benefits and alternatives. All questions were addressed. Maximal Sterile Barrier Technique was utilized including caps, mask, sterile gowns, sterile gloves, sterile drape, hand hygiene and skin antiseptic. A timeout was performed prior to the initiation of the procedure. Abdomen was evaluated with ultrasound. The PD catheter was easily identified in the subcutaneous tissues. Heterogeneous hypoechoic material around the catheter just superficial to the peritoneal cavity. The overlying skin was prepped with chlorhexidine and sterile field was created. Skin was anesthetized with 1%  lidocaine. 18 gauge trocar needle was directed into the pericatheter collection with ultrasound guidance. Approximately 1 mL of serosanguineous fluid was aspirated. A new 18 gauge needle was directed slightly more caudal but only a few additional drops of blood tinged fluid could be obtained. FINDINGS: Complex collection around the peritoneal  catheter in the deep subcutaneous tissues. Approximately 1 mL of serosanguineous fluid was removed. Residual hypoechoic material around the catheter may be related to chronic inflammatory process but nonspecific. IMPRESSION: Ultrasound-guided aspiration of the small complex collection around the peritoneal dialysis catheter in the abdominal subcutaneous tissues. Small amount of fluid was obtained and sent for culture. Patient will receive premedications for contrast allergy prior to injection of the PD catheter. Electronically Signed   By: Markus Daft M.D.   On: 12/28/2018 13:06   Dg Chest Portable 1 View  Result Date: 12/21/2018 CLINICAL DATA:  Shortness of Breath EXAM: PORTABLE CHEST 1 VIEW COMPARISON:  06/27/2018 FINDINGS: Cardiomegaly with vascular congestion. Perihilar opacities could reflect early interstitial edema. No effusions or acute bony abnormality. IMPRESSION: Cardiomegaly with vascular congestion and possible early perihilar edema. Electronically Signed   By: Rolm Baptise M.D.   On: 12/21/2018 10:49   Dg Abd Portable 1v  Result Date: 12/30/2018 CLINICAL DATA:  Peritoneal dialysis catheter dysfunction EXAM: PORTABLE ABDOMEN - 1 VIEW COMPARISON:  CT 12/27/2018, radiograph 02/13/2018 FINDINGS: Radiopaque material within the colon. Gas pattern is nonobstructed. The tip of the dialysis catheter is not confidently visualized. IMPRESSION: Limited study secondary to extensive radiopaque material within the colon. Dialysis catheter tip is poorly visualized. Electronically Signed   By: Donavan Foil M.D.   On: 12/30/2018 21:34   Vas [place] Lower Extremity Venous (dvt)  Result Date: 12/23/2018  Lower Venous Study Indications: Swelling, Edema, and Pain.  Limitations: Body habitus, poor ultrasound/tissue interface and pain tolerance. Performing Technologist: Abram Sander RVS  Examination Guidelines: A complete evaluation includes B-mode imaging, spectral Doppler, color Doppler, and power Doppler as needed of  all accessible portions of each vessel. Bilateral testing is considered an integral part of a complete examination. Limited examinations for reoccurring indications may be performed as noted.  Right Venous Findings: +---------+---------------+---------+-----------+----------+--------------+          CompressibilityPhasicitySpontaneityPropertiesSummary        +---------+---------------+---------+-----------+----------+--------------+ CFV      Full           Yes      Yes                                 +---------+---------------+---------+-----------+----------+--------------+ SFJ      Full           Yes      Yes                                 +---------+---------------+---------+-----------+----------+--------------+ FV Prox                                               Not visualized +---------+---------------+---------+-----------+----------+--------------+ FV Mid  Not visualized +---------+---------------+---------+-----------+----------+--------------+ FV Distal                                             Not visualized +---------+---------------+---------+-----------+----------+--------------+ PFV                                                   Not visualized +---------+---------------+---------+-----------+----------+--------------+ POP                     Yes      Yes                                 +---------+---------------+---------+-----------+----------+--------------+ PTV                                                   Not visualized +---------+---------------+---------+-----------+----------+--------------+ PERO                                                  Not visualized +---------+---------------+---------+-----------+----------+--------------+  Left Venous Findings: +---------+---------------+---------+-----------+----------+--------------+           CompressibilityPhasicitySpontaneityPropertiesSummary        +---------+---------------+---------+-----------+----------+--------------+ CFV                                                   Not visualized +---------+---------------+---------+-----------+----------+--------------+ SFJ                                                   Not visualized +---------+---------------+---------+-----------+----------+--------------+ FV Prox                                               Not visualized +---------+---------------+---------+-----------+----------+--------------+ FV Mid                                                Not visualized +---------+---------------+---------+-----------+----------+--------------+ FV Distal                                             Not visualized +---------+---------------+---------+-----------+----------+--------------+ PFV  Not visualized +---------+---------------+---------+-----------+----------+--------------+ POP                     Yes      Yes                                 +---------+---------------+---------+-----------+----------+--------------+ PTV                                                   Not visualized +---------+---------------+---------+-----------+----------+--------------+ PERO                                                  Not visualized +---------+---------------+---------+-----------+----------+--------------+    Summary: Right: There is no evidence of deep vein thrombosis in the lower extremity. However, portions of this examination were limited- see technologist comments above. No cystic structure found in the popliteal fossa. Left: There is no evidence of deep vein thrombosis in the lower extremity. However, portions of this examination were limited- see technologist comments above. No cystic structure found in the popliteal fossa.  *See  table(s) above for measurements and observations. Electronically signed by Ruta Hinds MD on 12/23/2018 at 3:25:29 PM.    Final        Subjective: Patient is feeling better, some nausea this am, no chest pain or dyspnea, abdominal pain and edema continue to improve.   Discharge Exam: Vitals:   01/04/19 0444 01/04/19 0939  BP: 121/64 (!) 112/59  Pulse: 69 75  Resp: 20 18  Temp: 97.7 F (36.5 C) 97.7 F (36.5 C)  SpO2: 100% 100%   Vitals:   01/03/19 2050 01/04/19 0230 01/04/19 0444 01/04/19 0939  BP: 134/66  121/64 (!) 112/59  Pulse: 73  69 75  Resp: _0 Temp: 98 F (36.7 C)  97.7 F (36.5 C) 97.7 F (36.5 C)  TempSrc: Oral  Oral Oral  SpO2: 100%  100% 100%  Weight:  (!) 158.3 kg    Height:        General: not in pain or dyspnea.  Neurology: Awake and alert, non focal  E ENT: no pallor, no icterus, oral mucosa moist Cardiovascular: No JVD. S1-S2 present, rhythmic, no gallops, rubs, or murmurs. +++ lymphedema lower extremity edema. Pulmonary: decreased breath sounds bilaterally at bases, adequate air movement, no wheezing, rhonchi or rales. Gastrointestinal. Abdomen with, no organomegaly, no rebound or guarding. Positive edema, tender to palpation and positive discoloration. PD catheter in place.  Skin. No rashes Musculoskeletal: no joint deformities   The results of significant diagnostics from this hospitalization (including imaging, microbiology, ancillary and laboratory) are listed below for reference.     Microbiology: Recent Results (from the past 240 hour(s))  Aerobic/Anaerobic Culture (surgical/deep wound)     Status: None   Collection Time: 12/28/18 10:53 AM  Result Value Ref Range Status   Specimen Description FLUID  Final   Special Requests NONE  Final   Gram Stain   Final    RARE WBC PRESENT, PREDOMINANTLY MONONUCLEAR NO ORGANISMS SEEN    Culture   Final    No growth aerobically or anaerobically. Performed at [facility], [address] [place], [place] [address]    Report Status 01/02/2019 FINAL  Final  Fungus Culture With Stain     Status: None (Preliminary result)   Collection Time: 12/28/18 10:54 AM  Result Value Ref Range Status   Fungus Stain Final report  Final    Comment: (NOTE) Performed At: [facility] [place], [place] [id number] Rush Farmer MD LA:[id number]    Fungus (Mycology) Culture PENDING  Incomplete   Fungal Source FLUID  Final  Fungus Culture Result     Status: None   Collection Time: 12/28/18 10:54 AM  Result Value Ref Range Status   Result 1 Comment  Final    Comment: (NOTE) KOH/Calcofluor preparation:  no fungus observed. Performed At: [facility] [place], [place] [id number] Rush Farmer MD BB:[id number]      Labs: BNP (last 3 results) No results for input(s): BNP in the last 8760 hours. Basic Metabolic Panel: Recent Labs  Lab 12/28/18 1151 12/30/18 0944 01/02/19 0607 01/02/19 1004 01/02/19 1828 01/03/19 0547 01/04/19 0649  NA 133*  --  132*  --   --  136 140  K 3.9  --  6.7* 6.1* 5.4* 4.0 4.0  CL 95*  --  96*  --   --  96* 97*  CO2 26  --  24  --   --  25 28  GLUCOSE 193*  --  413*  --   --  251* 169*  BUN 54*  --  69*  --   --  66* 63*  CREATININE 11.09*  --  11.80*  --   --  11.42* 11.45*  CALCIUM 8.8*  --  8.8*  --   --  8.4* 8.0*  PHOS  --  5.6* 6.1*  --   --  6.1* 5.9*   Liver Function Tests: Recent Labs  Lab 01/02/19 0607 01/03/19 0547 01/04/19 0649  ALBUMIN 2.8* 2.7* 2.6*   No results for input(s): LIPASE, AMYLASE in the last 168 hours. No results for input(s): AMMONIA in the last 168 hours. CBC: Recent Labs  Lab 01/02/19 0607 01/04/19 0649  WBC 10.1 10.2  NEUTROABS  --  6.3  HGB 10.2* 9.9*  HCT 32.1* 31.6*  MCV 101.6* 100.6*  PLT 295 307   Cardiac Enzymes: No results for input(s): CKTOTAL, CKMB, CKMBINDEX, TROPONINI in the last 168 hours. BNP: Invalid input(s): POCBNP CBG: Recent Labs   Lab 01/03/19 0737 01/03/19 1145 01/03/19 1631 01/03/19 2134 01/04/19 0736  GLUCAP 224* 159* 99 166* 143*   D-Dimer No results for input(s): DDIMER in the last 72 hours. Hgb A1c No results for input(s): HGBA1C in the last 72 hours. Lipid Profile No results for input(s): CHOL, HDL, LDLCALC, TRIG, CHOLHDL, LDLDIRECT in the last 72 hours. Thyroid function studies No results for input(s): TSH, T4TOTAL, T3FREE, THYROIDAB in the last 72 hours.  Invalid input(s): FREET3 Anemia work up No results for input(s): VITAMINB12, FOLATE, FERRITIN, TIBC, IRON, RETICCTPCT in the last 72 hours. Urinalysis    Component Value Date/Time   COLORURINE YELLOW 08/11/2016 1800   APPEARANCEUR HAZY (A) 08/11/2016 1800   LABSPEC 1.020 08/11/2016 1800   PHURINE 5.5 08/11/2016 1800   GLUCOSEU NEGATIVE 08/11/2016 1800   HGBUR MODERATE (A) 08/11/2016 1800   BILIRUBINUR NEGATIVE 08/11/2016 1800   KETONESUR NEGATIVE 08/11/2016 1800   PROTEINUR 100 (A) 08/11/2016 1800   UROBILINOGEN 0.2 12/02/2014 1925   NITRITE NEGATIVE 08/11/2016 1800   LEUKOCYTESUR NEGATIVE 08/11/2016 1800   Sepsis  Labs Invalid input(s): PROCALCITONIN,  WBC,  LACTICIDVEN Microbiology Recent Results (from the past 240 hour(s))  Aerobic/Anaerobic Culture (surgical/deep wound)     Status: None   Collection Time: 12/28/18 10:53 AM  Result Value Ref Range Status   Specimen Description FLUID  Final   Special Requests NONE  Final   Gram Stain   Final    RARE WBC PRESENT, PREDOMINANTLY MONONUCLEAR NO ORGANISMS SEEN    Culture   Final    No growth aerobically or anaerobically. Performed at [facility], [place] [address], [place], [place] [address]    Report Status 01/02/2019 FINAL  Final  Fungus Culture With Stain     Status: None (Preliminary result)   Collection Time: 12/28/18 10:54 AM  Result Value Ref Range Status   Fungus Stain Final report  Final    Comment: (NOTE) Performed At: [facility] [place], [place] [id number] Rush Farmer MD [id number]    Fungus (Mycology) Culture PENDING  Incomplete   Fungal Source FLUID  Final  Fungus Culture Result     Status: None   Collection Time: 12/28/18 10:54 AM  Result Value Ref Range Status   Result 1 Comment  Final    Comment: (NOTE) KOH/Calcofluor preparation:  no fungus observed. Performed At: [facility] [place], [place] [id number] Rush Farmer MD [id number]      Time coordinating discharge: 45 minutes  SIGNED:   Tawni Millers, MD  [facility] 01/04/2019, 10:02 AM Pager [phone]  If 7PM-7AM, please contact night-coverage [email or web address] Password [email or web address]

## 2019-01-04 NOTE — Progress Notes (Signed)
Subjective: Interval History: has no complaint, feels much better. Agrees that less fluid will help.  Objective: Vital signs in last 24 hours: Temp:  [97.7 F (36.5 C)-98 F (36.7 C)] 97.7 F (36.5 C) (01/16 0939) Pulse Rate:  [69-78] 75 (01/16 0939) Resp:  [15-20] 18 (01/16 0939) BP: (112-140)/(59-66) 112/59 (01/16 0939) SpO2:  [100 %] 100 % (01/16 0939) Weight:  [158.3 kg-158.7 kg] 158.3 kg (01/16 0230) Weight change: -2.9 kg  Intake/Output from previous day: 01/15 0701 - 01/16 0700 In: 480 [P.O.:480] Out: 100 [Urine:100] Intake/Output this shift: Total I/O In: 120 [P.O.:120] Out: -   General appearance: alert, cooperative, no distress and morbidly obese Resp: diminished breath sounds bilaterally Cardio: S1, S2 normal and systolic murmur: systolic ejection 2/6, crescendo and decrescendo at 2nd left intercostal space GI: obese, PD cath Lmid abdm , edema, mild tender, less induraton LLQ,  Extremities: edema 3-4+ and lymphedema LLE  Lab Results: Recent Labs    01/02/19 0607 01/04/19 0649  WBC 10.1 10.2  HGB 10.2* 9.9*  HCT 32.1* 31.6*  PLT 295 307   BMET:  Recent Labs    01/03/19 0547 01/04/19 0649  NA 136 140  K 4.0 4.0  CL 96* 97*  CO2 25 28  GLUCOSE 251* 169*  BUN 66* 63*  CREATININE 11.42* 11.45*  CALCIUM 8.4* 8.0*   No results for input(s): PTH in the last 72 hours. Iron Studies: No results for input(s): IRON, TIBC, TRANSFERRIN, FERRITIN in the last 72 hours.  Studies/Results: No results found.  I have reviewed the patient's current medications.  Assessment/Plan: 1 ESRD PD going well, good solute, acid/base/K.  Removing vol. Still about 20-25lb up. Use 4.25 at home 2 DM improving 3 Anemia esa, 4 HPTH cinn 5 lymphedema P PD, 4.25. ok to d/c    LOS: 14 days   [name] 01/04/2019,10:07 AM

## 2019-01-04 NOTE — Progress Notes (Signed)
Occupational Therapy Treatment Patient Details Name: [name] MRN: [id number] DOB: Aug 15, 1966 Today's Date: 01/04/2019    History of present illness [name] is a 53 y.o. female with ESRD on PD admitted with LE and abominal pain with concern for blood clots.  Found to be positive for influenza B and was tested, but negative for peritonitis.   OT comments  Pt progressing towards acute OT goals. Focus of session was UB/LB dressing and taking pivotal steps from EOB to recliner. Pt happy to be going home today. Some dizziness reported on initial stand that resolved with time and seated rest break. Pt on 3L supplemental O2 throughout session. D/c plan remains appropriate.     Follow Up Recommendations  Home health OT;Supervision - Intermittent(OOB/mobility)    Equipment Recommendations  None recommended by OT    Recommendations for Other Services      Precautions / Restrictions Precautions Precautions: Fall Restrictions Weight Bearing Restrictions: No       Mobility Bed Mobility Overal bed mobility: Needs Assistance Bed Mobility: Supine to Sit     Supine to sit: Supervision;HOB elevated     General bed mobility comments: able to advance BLE off bed and use momentum to powerup trunk.  Transfers Overall transfer level: Needs assistance Equipment used: Rolling walker (2 wheeled) Transfers: Sit to/from Stand Sit to Stand: Min guard         General transfer comment: from regular bed height (pt's request). Min guard for safety. No physical assist. Pt using momentum to powerup to standing    Balance Overall balance assessment: Needs assistance Sitting-balance support: No upper extremity supported;Feet supported Sitting balance-Leahy Scale: Good     Standing balance support: Bilateral upper extremity supported;During functional activity Standing balance-Leahy Scale: Poor Standing balance comment: relies on rw during static standing                            ADL either performed or assessed with clinical judgement   ADL Overall ADL's : Needs assistance/impaired                 Upper Body Dressing : Set up;Sitting                   Functional mobility during ADLs: Min guard;Minimal assistance;Rolling walker(pivotal steps from EOB to recliner) General ADL Comments: Pt completed bed mobility, UB dressing, stood to adjust dress down onto legs at close minguard to light min A to steady. Pt then compelted pivotal steps EOB to recliner.     Vision       Perception     Praxis      Cognition Arousal/Alertness: Awake/alert Behavior During Therapy: WFL for tasks assessed/performed Overall Cognitive Status: Within Functional Limits for tasks assessed                                          Exercises     Shoulder Instructions       General Comments      Pertinent Vitals/ Pain       Pain Assessment: Faces Faces Pain Scale: Hurts little more Pain Location: reports chronic pain in LE's Pain Descriptors / Indicators: Discomfort;Sore Pain Intervention(s): Monitored during session;Repositioned;Limited activity within patient's tolerance  Home Living  Prior Functioning/Environment              Frequency  Min 3X/week        Progress Toward Goals  OT Goals(current goals can now be found in the care plan section)  Progress towards OT goals: Progressing toward goals  Acute Rehab OT Goals Patient Stated Goal: restore basic mobility to PLOF  OT Goal Formulation: With patient Time For Goal Achievement: 01/08/19 Potential to Achieve Goals: Good ADL Goals Pt Will Perform Lower Body Bathing: with min assist;sit to/from stand;sitting/lateral leans;with adaptive equipment Pt Will Perform Lower Body Dressing: with min assist;sit to/from stand;sitting/lateral leans;with adaptive equipment Pt Will Transfer to Toilet: with min  assist;bedside commode Pt Will Perform Toileting - Clothing Manipulation and hygiene: with supervision Pt Will Perform Tub/Shower Transfer: with min assist;shower seat Additional ADL Goal #1: Pt will perform bed mobility at min A level in preparation for BADL activity  Plan Discharge plan remains appropriate    Co-evaluation                 AM-PAC OT "6 Clicks" Daily Activity     Outcome Measure   Help from another person eating meals?: None Help from another person taking care of personal grooming?: None Help from another person toileting, which includes using toliet, bedpan, or urinal?: A Little Help from another person bathing (including washing, rinsing, drying)?: A Lot Help from another person to put on and taking off regular upper body clothing?: None Help from another person to put on and taking off regular lower body clothing?: A Lot 6 Click Score: 19    End of Session Equipment Utilized During Treatment: Rolling walker;Oxygen  OT Visit Diagnosis: Other abnormalities of gait and mobility (R26.89);Muscle weakness (generalized) (M62.81);History of falling (Z91.81)   Activity Tolerance Patient tolerated treatment well;Other (comment)(some dizziness once standing, resolved with time and seated )   Patient Left in chair;with call bell/phone within reach   Nurse Communication          Time: 1208-1229 OT Time Calculation (min): 21 min  Charges: OT General Charges $OT Visit: 1 Visit OT Treatments $Self Care/Home Management : 8-22 mins  Tyrone Schimke, OT Acute Rehabilitation Services Pager: [phone] Office: [phone]   Hortencia Pilar 01/04/2019, 12:39 PM

## 2019-01-04 NOTE — Care Management Note (Signed)
Case Management Note  Patient Details  Name: [name] MRN: [id number] Date of Birth: 1966-08-29  Subjective/Objective:                    Action/Plan:  Butch Penny with [facility] aware patient discharging today . Expected Discharge Date:  01/04/19               Expected Discharge Plan:  [place]  In-House Referral:  NA  Discharge planning Services  CM Consult  Post Acute Care Choice:  NA Choice offered to:  Patient  DME Arranged:  N/A DME Agency:  NA  HH Arranged:  PT, OT, RN Victoria Vera Agency:  [place]  Status of Service:  Completed, signed off  If discussed at [place] of Stay Meetings, dates discussed:    Additional Comments:  Marilu Favre, RN 01/04/2019, 10:38 AM

## 2019-01-04 NOTE — Consult Note (Signed)
[facility] CM Inpatient Consult   01/04/2019  [name] September 03, 1966 [id number]  Patient screened for [place] Management services for unplanned readmission risk score of 30%. Patient had been active with [place] [facility] for chronic disease management services in the past.    Spoke with Ms. Sifuentes at bedside. Reviewed the [place] Management services with patient. Patient agrees to post hospital follow up with a [place] for complex case management. Consent on file. Ms. Cudd denies needs for transportation, meals, or medications at this time. Patient confirms that her PCP is Dr. [name]. A [facility] referral will be placed.  Of note, [facility] Care Management services does not replace or interfere with any services that are needed or arranged by inpatient case management or social work.    Netta Cedars, MSN, [facility] Liaison Nurse Mobile Phone [phone]  Toll free office [phone]

## 2019-01-08 DIAGNOSIS — L03311 Cellulitis of abdominal wall: Secondary | ICD-10-CM | POA: Diagnosis not present

## 2019-01-08 DIAGNOSIS — J101 Influenza due to other identified influenza virus with other respiratory manifestations: Secondary | ICD-10-CM | POA: Diagnosis not present

## 2019-01-08 DIAGNOSIS — Z6841 Body Mass Index (BMI) 40.0 and over, adult: Secondary | ICD-10-CM | POA: Diagnosis not present

## 2019-01-08 DIAGNOSIS — Z79899 Other long term (current) drug therapy: Secondary | ICD-10-CM | POA: Diagnosis not present

## 2019-01-08 DIAGNOSIS — M793 Panniculitis, unspecified: Secondary | ICD-10-CM | POA: Diagnosis not present

## 2019-01-08 DIAGNOSIS — J440 Chronic obstructive pulmonary disease with acute lower respiratory infection: Secondary | ICD-10-CM | POA: Diagnosis not present

## 2019-01-08 DIAGNOSIS — Z85850 Personal history of malignant neoplasm of thyroid: Secondary | ICD-10-CM | POA: Diagnosis not present

## 2019-01-08 DIAGNOSIS — Z992 Dependence on renal dialysis: Secondary | ICD-10-CM | POA: Diagnosis not present

## 2019-01-08 DIAGNOSIS — N186 End stage renal disease: Secondary | ICD-10-CM | POA: Diagnosis not present

## 2019-01-08 DIAGNOSIS — I890 Lymphedema, not elsewhere classified: Secondary | ICD-10-CM | POA: Diagnosis not present

## 2019-01-08 DIAGNOSIS — J9611 Chronic respiratory failure with hypoxia: Secondary | ICD-10-CM | POA: Diagnosis not present

## 2019-01-08 DIAGNOSIS — I132 Hypertensive heart and chronic kidney disease with heart failure and with stage 5 chronic kidney disease, or end stage renal disease: Secondary | ICD-10-CM | POA: Diagnosis not present

## 2019-01-08 DIAGNOSIS — E1122 Type 2 diabetes mellitus with diabetic chronic kidney disease: Secondary | ICD-10-CM | POA: Diagnosis not present

## 2019-01-08 DIAGNOSIS — I5032 Chronic diastolic (congestive) heart failure: Secondary | ICD-10-CM | POA: Diagnosis not present

## 2019-01-09 ENCOUNTER — Other Ambulatory Visit: Payer: Self-pay

## 2019-01-09 DIAGNOSIS — J101 Influenza due to other identified influenza virus with other respiratory manifestations: Secondary | ICD-10-CM | POA: Diagnosis not present

## 2019-01-09 DIAGNOSIS — L03311 Cellulitis of abdominal wall: Secondary | ICD-10-CM | POA: Diagnosis not present

## 2019-01-09 DIAGNOSIS — E1122 Type 2 diabetes mellitus with diabetic chronic kidney disease: Secondary | ICD-10-CM

## 2019-01-09 DIAGNOSIS — J440 Chronic obstructive pulmonary disease with acute lower respiratory infection: Secondary | ICD-10-CM | POA: Diagnosis not present

## 2019-01-09 DIAGNOSIS — N184 Chronic kidney disease, stage 4 (severe): Secondary | ICD-10-CM

## 2019-01-09 DIAGNOSIS — Z794 Long term (current) use of insulin: Secondary | ICD-10-CM

## 2019-01-09 DIAGNOSIS — I10 Essential (primary) hypertension: Secondary | ICD-10-CM

## 2019-01-09 DIAGNOSIS — M793 Panniculitis, unspecified: Secondary | ICD-10-CM | POA: Diagnosis not present

## 2019-01-09 DIAGNOSIS — I132 Hypertensive heart and chronic kidney disease with heart failure and with stage 5 chronic kidney disease, or end stage renal disease: Secondary | ICD-10-CM | POA: Diagnosis not present

## 2019-01-09 DIAGNOSIS — N186 End stage renal disease: Secondary | ICD-10-CM

## 2019-01-09 NOTE — Patient Outreach (Signed)
[facility]) Care Management  [place]  01/09/2019   AHUVA POYNOR 07/30/1966 [id number]  Subjective: Referral received from [facility] for complex case management. Initial telephonic assessment complete.  Objective:  Encounter Medications:  Outpatient Encounter Medications as of 01/09/2019  Medication Sig Note  . acetaminophen (TYLENOL) 325 MG tablet Take 650 mg by mouth every 6 (six) hours as needed for moderate pain or fever.    Marland Kitchen albuterol (PROVENTIL) (2.5 MG/3ML) 0.083% nebulizer solution Take 2.5 mg by nebulization every 6 (six) hours as needed for wheezing or shortness of breath.   . ALPRAZolam (XANAX) 0.5 MG tablet Take 0.5 mg by mouth 3 (three) times daily as needed for anxiety or sleep.    Marland Kitchen amLODipine (NORVASC) 10 MG tablet Take 0.5 tablets (5 mg total) by mouth daily.   . blood glucose meter kit and supplies KIT Dispense based on patient and insurance preference. Use up to four times daily as directed. (FOR ICD-9 250.00, 250.01).   . cephALEXin (KEFLEX) 500 MG capsule Take 1 capsule (500 mg total) by mouth every 12 (twelve) hours for 6 days.   . diphenhydrAMINE (BENADRYL) 25 mg capsule Take 25 mg by mouth every 6 (six) hours as needed for allergies.   Marland Kitchen insulin detemir (LEVEMIR) 100 UNIT/ML injection Inject 10 Units into the skin daily. 07/13/2018: vial  . lanthanum (FOSRENOL) 500 MG chewable tablet Chew 1,000-1,500 mg by mouth 5 (five) times daily. Patient takes 3 tablets by mouth three times a day with meals and 2 tablets by mouth twice a day with snacks   . multivitamin (RENA-VIT) TABS tablet Take 1 tablet by mouth daily.   . Oxycodone HCl 10 MG TABS Take 1 tablet (10 mg total) by mouth 4 (four) times daily as needed (pain).   . polyethylene glycol (MIRALAX / GLYCOLAX) packet Take 17 g by mouth 2 (two) times daily for 30 days.   . potassium chloride 20 MEQ TBCR Take 40 mEq by mouth daily.   . [DISCONTINUED] insulin lispro (HUMALOG) 100  UNIT/ML injection Inject 60 Units into the skin 2 (two) times daily.      No facility-administered encounter medications on file as of 01/09/2019.     Functional Status:  In your present state of health, do you have any difficulty performing the following activities: 01/09/2019 12/21/2018  Hearing? N N  Vision? N Y  Comment - "starting to get that way"  Difficulty concentrating or making decisions? N Y  Comment - "only w/pain RX"  Walking or climbing stairs? Y Y  Dressing or bathing? N Y  Comment - -  Doing errands, shopping? Y Y  Comment - "haven't driven in over 1 yr"  Preparing Food and eating ? N -  Using the Toilet? N -  In the past six months, have you accidently leaked urine? N -  Comment - -  Do you have problems with loss of bowel control? N -  Managing your Medications? [place] referral placed due to changes in insurance coverage. -  Managing your Finances? N -  Housekeeping or managing your Housekeeping? Y -  Some recent data might be hidden    Fall/Depression Screening: Fall Risk  01/09/2019 09/18/2018 07/13/2018  Falls in the past year? 1 Yes Yes  Number falls in past yr: _0 Injury with Fall? 0 Yes Yes  Comment Reported no injury within the last year. - -  Risk Factor Category  -  High Fall Risk High Fall Risk  Risk for fall due to : History of fall(s);Impaired balance/gait;Impaired mobility History of fall(s);Medication side effect History of fall(s);Medication side effect  Follow up Falls prevention discussed Follow up appointment Education provided;Falls evaluation completed;Falls prevention discussed  Comment - Skid resistanr socks. -   PHQ 2/9 Scores 01/09/2019 09/18/2018 07/13/2018  PHQ - 2 Score 0 1 0    Assessment:  Successful outreach with [name]. HIPAA verifiers obtained. She reported doing well and stated "things are going great" since hospital discharge. Denied complaints of shortness of breath or chest discomfort. Reported  decreased ambulation and using power wheelchair in the home. Denied falls. Reported compliance with medications, daily weights, and blood glucose monitoring. Reported continued use of oxygen at 2L/min and performs peritoneal dialysis in the home. Confirmed resumption of services with [facility]. Reported scheduling recommended follow-ups and will be evaluated by Dr. Hinda Lenis on tomorrow.  Discussed goals and care management needs. Mrs. Oboyle reported that her spouse and granddaughter were available to assist in the home, and sister was available to provide transportation. Declined need for nutrition assistance or additional community referrals. Expressed concerns regarding medication coverage due to insurance changes. Agreeable to follow-up outreach from [place]. Member agreeable to [facility] services and prefers telephonic outreach. Denied urgent concerns. Encouraged to contact RNCM with questions if needed prior to next outreach.  THN CM Care Plan Problem One     Most Recent Value  Care Plan Problem One  High Risk for Readmission  Role Documenting the Problem One  Care Management [place] for Problem One  Active  THN Long Term Goal   Patient will not be hospitalized for chronic disease complications over the next 60 days.  THN Long Term Goal Start Date  01/09/19  Interventions for Problem One Long Term Goal  Discussed discharge instructions, medications, daily weights, blood glucose readings and nutrition. Discussed s/sx of infection and indications for seeking immediate medical attention.   THN CM Short Term Goal #1   Over the next 30 days patient will attend all scheduled MD follow-ups.  THN CM Short Term Goal #1 Start Date  01/09/19  Interventions for Short Term Goal #1  Discussed pending appointments and importance of attending as scheduled.  [Reported sister available to provide transportation.]  THN CM Short Term Goal #2   Over the next 30 days patient will take all  medications as prescribed.  THN CM Short Term Goal #2 Start Date  01/09/19  Interventions for Short Term Goal #2  Reviewed medications and indications for use. Patient pending outreach from [facility] Pharmacist regarding possible changes in coverage.  THN CM Short Term Goal #3  Over the next 30 days patient will continue to weigh daily.  THN CM Short Term Goal #3 Start Date  01/09/19  Interventions for Short [place]ern Goal #3  Discussed weight ranges and indications for notifying MD.     PLAN Will continue routine outreach.  [place] [phone]

## 2019-01-10 ENCOUNTER — Telehealth: Payer: Self-pay | Admitting: Pharmacist

## 2019-01-10 DIAGNOSIS — L03311 Cellulitis of abdominal wall: Secondary | ICD-10-CM | POA: Diagnosis not present

## 2019-01-10 DIAGNOSIS — I132 Hypertensive heart and chronic kidney disease with heart failure and with stage 5 chronic kidney disease, or end stage renal disease: Secondary | ICD-10-CM | POA: Diagnosis not present

## 2019-01-10 DIAGNOSIS — J101 Influenza due to other identified influenza virus with other respiratory manifestations: Secondary | ICD-10-CM | POA: Diagnosis not present

## 2019-01-10 DIAGNOSIS — J440 Chronic obstructive pulmonary disease with acute lower respiratory infection: Secondary | ICD-10-CM | POA: Diagnosis not present

## 2019-01-10 DIAGNOSIS — M793 Panniculitis, unspecified: Secondary | ICD-10-CM | POA: Diagnosis not present

## 2019-01-10 DIAGNOSIS — E1122 Type 2 diabetes mellitus with diabetic chronic kidney disease: Secondary | ICD-10-CM | POA: Diagnosis not present

## 2019-01-11 ENCOUNTER — Other Ambulatory Visit: Payer: Self-pay | Admitting: Pharmacy Technician

## 2019-01-11 NOTE — Patient Outreach (Addendum)
[place] [facility]) Care Management  [place]   01/11/2019  [name] Jan 27, 1966 [id number]  Reason for referral: Medication Assistance with Levemir and Humalog  Referral source: [facility] RN Current insurance:[employer] & [employer]  PMHx includes but not limited to:  Type 2 diabetes, ESRD on dialysis, hyperlipidemia, hypertension, morbid obesity, SOB, chronic shoulder pain, chronic lymphedema,   Outreach:  Successful telephone call with patient  HIPAA identifiers verified.   Subjective:  Patient is a 53 year old female with the above mentioned medical conditions in need of medication assistance with insulin. She said she had a prescription at the pharmacy to pick up but was unsure of what it was and the cost. Patient has [place]. She uses [facility]. Patient was hospitlized 12/21/2018-01/04/2019 for abdominal and leg pain (she had a positive flu test in the ED ).    Objective: Lab Results  Component Value Date   CREATININE 11.45 (H) 01/04/2019   CREATININE 11.42 (H) 01/03/2019   CREATININE 11.80 (H) 01/02/2019    Lab Results  Component Value Date   HGBA1C 9.0 (H) 06/28/2018    Lipid Panel     Component Value Date/Time   CHOL  04/29/2009 0630    167        ATP III CLASSIFICATION:  <200     mg/dL   Desirable  200-239  mg/dL   Borderline High  >=240    mg/dL   High          TRIG 220 (H) 04/29/2009 0630   HDL 37 (L) 04/29/2009 0630   CHOLHDL 4.5 04/29/2009 0630   VLDL 44 (H) 04/29/2009 0630   LDLCALC  04/29/2009 0630    86        Total Cholesterol/HDL:CHD Risk Coronary Heart Disease Risk Table                     Men   Women  1/2 Average Risk   3.4   3.3  Average Risk       5.0   4.4  2 X Average Risk   9.6   7.1  3 X Average Risk  23.4   11.0        Use the calculated Patient Ratio above and the CHD Risk Table to determine the patient's CHD Risk.        ATP III CLASSIFICATION (LDL):  <100     mg/dL   Optimal  100-129  mg/dL   Near or Above                    Optimal  130-159  mg/dL   Borderline  160-189  mg/dL   High  >190     mg/dL   Very High    BP Readings from Last 3 Encounters:  01/04/19 126/67  07/13/18 (!) 146/70  06/30/18 130/70    Allergies  Allergen Reactions  . Contrast Media [Iodinated Diagnostic Agents] Anaphylaxis, Hives, Swelling and Other (See Comments)    Dye for cardiac cath. Tongue swells  . Pneumococcal Vaccines Swelling and Other (See Comments)    Turns skin black, and bodily swelling  . Vancomycin Nausea And Vomiting and Other (See Comments)    Infusion "made me feel like I was dying" had to be readmitted to hospital    Medications Reviewed Today    Reviewed by [name], [facility] (Pharmacist) on 01/10/19 at 1444  Med List Status: <None>  Medication Order Taking? Sig Documenting Provider Last Dose Status Informant  acetaminophen (TYLENOL) 325 MG tablet [id number] No Take 650 mg by mouth every 6 (six) hours as needed for moderate pain or fever.  [provider] Not Taking Active Self  albuterol (PROVENTIL) (2.5 MG/3ML) 0.083% nebulizer solution [id number] Yes Take 2.5 mg by nebulization every 6 (six) hours as needed for wheezing or shortness of breath. [provider] Taking Active Self           Med Note Modena Nunnery, Ivar Drape   Fri Aug 26, 2017  4:19 PM)    ALPRAZolam Duanne Moron) 0.5 MG tablet [id number] Yes Take 0.5 mg by mouth 3 (three) times daily as needed for anxiety or sleep.  [provider] Taking Active Self  amLODipine (NORVASC) 10 MG tablet [id number] Yes Take 0.5 tablets (5 mg total) by mouth daily. [name], MD Taking Active Self  blood glucose meter kit and supplies KIT [id number] Yes Dispense based on patient and insurance preference. Use up to four times daily as directed. (FOR ICD-9 250.00, 250.01). Oswald Hillock, MD Taking Active Self  calcitRIOL (ROCALTROL) 0.5 MCG capsule [id number] Yes Take 0.5 mcg by mouth daily. [provider] Taking Active   cephALEXin (KEFLEX) 500 MG capsule [id number] Yes Take 1 capsule (500 mg total) by mouth every 12 (twelve) hours for 6 days. Arrien, Jimmy Picket, MD Taking Active   diphenhydrAMINE (BENADRYL) 25 mg capsule [id number] Yes Take 25 mg by mouth every 6 (six) hours as needed for allergies. [provider] Taking Active Self  insulin detemir (LEVEMIR) 100 UNIT/ML injection [id number] No Inject 10 Units into the skin daily. [provider] Not Taking Active Self           Med Note Parthenia Ames Jul 13, 2018  4:19 PM) vial        Discontinued 03/08/12 1020 (Change in therapy)   lanthanum (FOSRENOL) 500 MG chewable tablet [id number] Yes Chew 1,000-1,500 mg by mouth 5 (five) times daily. Patient takes 3 tablets by mouth three times a day with meals and 2 tablets by mouth twice a day with snacks [provider] Taking Active Self  multivitamin (RENA-VIT) TABS tablet [id number] Yes Take 1 tablet by mouth daily. [provider] Taking Active Self  Oxycodone HCl 10 MG TABS [id number] Yes Take 1 tablet (10 mg total) by mouth 4 (four) times daily as needed (pain). [name], MD Taking Active Self  polyethylene glycol [facility] / GLYCOLAX) packet [id number] Yes Take 17 g by mouth 2 (two) times daily for 30 days. Arrien, Jimmy Picket, MD Taking Active   potassium chloride 20 MEQ TBCR [id number] Yes Take 40 mEq by mouth daily. Oswald Hillock, MD Taking Active Self  senna (SENOKOT) 8.6 MG tablet [id number] Yes Take 1 tablet by mouth daily. [provider] Taking Active           Assessment: ASSESSMENT: Date Discharged from Hospital: 01/14/2019 Date Medication Reconciliation Performed: 01/11/2019  Medications:  New at Discharge: ) . polyethylene glycol (MIRALAX / GLYCOLAX) . cephALEXin (KEFLEX)  Adjustments at Discharge: . none  Discontinued at Discharge:   none  Patient was recently discharged from hospital and all  medications have been reviewed.  Drugs sorted by system:  Neurologic/Psychologic: Alprazolam  Cardiovascular: Amlodipine,  Pulmonary/Allergy: Albuterol nebulizer solution, diphenhydramine,   Gastrointestinal: Senna, Polyethylene Glycol   Endocrine: Levmir (not using), Humalog (not using)  Renal: Fosrenol, Calcitriol  Topical:  Pain: Oxycodone, Acetaminophen (patient said not taking)  Vitamins/Minerals: Rena Vit, Potassium Chloride,   Infectious Diseases: Cephalexin   Medication Assistance Findings:  Medication assistance needs identified.   Extra Help:   _0  Already receiving Full Extra Help  _1  Already receiving Partial Extra Help  _2  Eligible based on reported income and assets  _3  Not Eligible based on reported income and assets  Patient Assistance Programs: 1) Levemir  made by [employer] o Income requirement met: _4  Yes _5  No _6  Unknown o Out-of-pocket prescription expenditure met:    _7  Yes _8  No  _9  Unknown  <[id number]>_8<\/WSBBJXFFKVQOHCOB>_79  Not applicable - Patient has not met application requirements to apply for this patient assistance program at this time.         2)  Humalog made by [employer] o Income requirement met: _11  Yes _12  No  _13  Unknown o Out-of-pocket prescription expenditure met:   _14  Yes _15  No   _16  Unknown <[id number]>_0<\/GEEATVVLRTJWWZLY>_78  Not applicable - Patient has met application requirements to apply for this patient  assistance program.    - If deemed therapeutically appropriate, Basglar could be substituted for Levemir as it is available from [employer] as well.  [facility] was called. The pharmacist said they attempted to bill the patient's albuterol nebulizer solution to the patient's Part D plan. Albuterol nebulizer solution should be billed to the patient's Part B plan.  [facility] said they did not have a copy of the patient's Medicare Card. A copy of the patient's Medicare Card was sent to the pharmacy from [facility] via fax.  [facility] also stated they would reach out to Dr. [name] about getting the necessary information put on the prescriptions (ICD-10 code, etc).   Patient reported she had not used insulin over 6 months due to not being able to afford it.  Additional medication assistance options reviewed with patient as warranted:  No other options identified  Plan: I will route patient assistance letter to [place] technician who will coordinate patient assistance program application process for medications listed above.  [facility] pharmacy technician will assist with obtaining all required documents from both patient and provider(s) and submit application(s) once completed. , Will route note to Dr. [name]. , Will follow-up in 2 weeks     [name], PharmD, [facility] Clinical Pharmacist [phone]

## 2019-01-11 NOTE — Patient Outreach (Signed)
[place] [facility]) Care Management  01/11/2019  ALAYZHA AN September 16, 1966 [id number]                                                  Medication Assistance Referral  Referral From: [place]  Medication/Company: Judene Companion Patient application portion:  Mailed Provider application portion: Faxed  to Dr. Lowanda Foster  Medication/Company: Humalog / Lilly Patient application portion:  Mailed Provider application portion: Faxed  to Dr. Lowanda Foster   Follow up: 5-7 business days  Will follow up with patient in 5-7 business days to confirm application(s) have been received.  Elim Peale P. Analycia Khokhar, [place] Management [phone]

## 2019-01-12 DIAGNOSIS — I132 Hypertensive heart and chronic kidney disease with heart failure and with stage 5 chronic kidney disease, or end stage renal disease: Secondary | ICD-10-CM | POA: Diagnosis not present

## 2019-01-12 DIAGNOSIS — L03311 Cellulitis of abdominal wall: Secondary | ICD-10-CM | POA: Diagnosis not present

## 2019-01-12 DIAGNOSIS — J440 Chronic obstructive pulmonary disease with acute lower respiratory infection: Secondary | ICD-10-CM | POA: Diagnosis not present

## 2019-01-12 DIAGNOSIS — M793 Panniculitis, unspecified: Secondary | ICD-10-CM | POA: Diagnosis not present

## 2019-01-12 DIAGNOSIS — J101 Influenza due to other identified influenza virus with other respiratory manifestations: Secondary | ICD-10-CM | POA: Diagnosis not present

## 2019-01-12 DIAGNOSIS — E1122 Type 2 diabetes mellitus with diabetic chronic kidney disease: Secondary | ICD-10-CM | POA: Diagnosis not present

## 2019-01-16 DIAGNOSIS — J101 Influenza due to other identified influenza virus with other respiratory manifestations: Secondary | ICD-10-CM | POA: Diagnosis not present

## 2019-01-16 DIAGNOSIS — M793 Panniculitis, unspecified: Secondary | ICD-10-CM | POA: Diagnosis not present

## 2019-01-16 DIAGNOSIS — L03311 Cellulitis of abdominal wall: Secondary | ICD-10-CM | POA: Diagnosis not present

## 2019-01-16 DIAGNOSIS — J440 Chronic obstructive pulmonary disease with acute lower respiratory infection: Secondary | ICD-10-CM | POA: Diagnosis not present

## 2019-01-16 DIAGNOSIS — E1122 Type 2 diabetes mellitus with diabetic chronic kidney disease: Secondary | ICD-10-CM | POA: Diagnosis not present

## 2019-01-16 DIAGNOSIS — I132 Hypertensive heart and chronic kidney disease with heart failure and with stage 5 chronic kidney disease, or end stage renal disease: Secondary | ICD-10-CM | POA: Diagnosis not present

## 2019-01-17 DIAGNOSIS — L03311 Cellulitis of abdominal wall: Secondary | ICD-10-CM | POA: Diagnosis not present

## 2019-01-17 DIAGNOSIS — M793 Panniculitis, unspecified: Secondary | ICD-10-CM | POA: Diagnosis not present

## 2019-01-17 DIAGNOSIS — J440 Chronic obstructive pulmonary disease with acute lower respiratory infection: Secondary | ICD-10-CM | POA: Diagnosis not present

## 2019-01-17 DIAGNOSIS — J101 Influenza due to other identified influenza virus with other respiratory manifestations: Secondary | ICD-10-CM | POA: Diagnosis not present

## 2019-01-17 DIAGNOSIS — I132 Hypertensive heart and chronic kidney disease with heart failure and with stage 5 chronic kidney disease, or end stage renal disease: Secondary | ICD-10-CM | POA: Diagnosis not present

## 2019-01-17 DIAGNOSIS — E1122 Type 2 diabetes mellitus with diabetic chronic kidney disease: Secondary | ICD-10-CM | POA: Diagnosis not present

## 2019-01-18 DIAGNOSIS — L03311 Cellulitis of abdominal wall: Secondary | ICD-10-CM | POA: Diagnosis not present

## 2019-01-18 DIAGNOSIS — J101 Influenza due to other identified influenza virus with other respiratory manifestations: Secondary | ICD-10-CM | POA: Diagnosis not present

## 2019-01-18 DIAGNOSIS — M793 Panniculitis, unspecified: Secondary | ICD-10-CM | POA: Diagnosis not present

## 2019-01-18 DIAGNOSIS — I132 Hypertensive heart and chronic kidney disease with heart failure and with stage 5 chronic kidney disease, or end stage renal disease: Secondary | ICD-10-CM | POA: Diagnosis not present

## 2019-01-18 DIAGNOSIS — J440 Chronic obstructive pulmonary disease with acute lower respiratory infection: Secondary | ICD-10-CM | POA: Diagnosis not present

## 2019-01-18 DIAGNOSIS — E1122 Type 2 diabetes mellitus with diabetic chronic kidney disease: Secondary | ICD-10-CM | POA: Diagnosis not present

## 2019-01-19 ENCOUNTER — Other Ambulatory Visit: Payer: Self-pay | Admitting: Pharmacy Technician

## 2019-01-19 DIAGNOSIS — M793 Panniculitis, unspecified: Secondary | ICD-10-CM | POA: Diagnosis not present

## 2019-01-19 DIAGNOSIS — I132 Hypertensive heart and chronic kidney disease with heart failure and with stage 5 chronic kidney disease, or end stage renal disease: Secondary | ICD-10-CM | POA: Diagnosis not present

## 2019-01-19 DIAGNOSIS — L03311 Cellulitis of abdominal wall: Secondary | ICD-10-CM | POA: Diagnosis not present

## 2019-01-19 DIAGNOSIS — J101 Influenza due to other identified influenza virus with other respiratory manifestations: Secondary | ICD-10-CM | POA: Diagnosis not present

## 2019-01-19 DIAGNOSIS — J440 Chronic obstructive pulmonary disease with acute lower respiratory infection: Secondary | ICD-10-CM | POA: Diagnosis not present

## 2019-01-19 DIAGNOSIS — E1122 Type 2 diabetes mellitus with diabetic chronic kidney disease: Secondary | ICD-10-CM | POA: Diagnosis not present

## 2019-01-19 NOTE — Patient Outreach (Signed)
[name] [facility]) Care Management  01/19/2019  [name] Jul 22, 1966 [id number]   Successful outreach call placed to patient in regards to [place] patient assistance for Basaglar and Humalog.  Spoke to patient, HIPAA identifiers verified. Inquired if she had received her patient assistance applications in the mail and the patient stated she had not. She informed that she had her and her spouse's proof of income ready to send back to [place]. Informed patient that our mail has to go to the hospital for postage before being mailed out. Informed patient that I would followup with her early next week and if she has still not received them, then I would remail them to her.  Will followup with patient in 2-3 business days to inquire if applications have been received.  Terez Freimark P. Madaleine Simmon, [place] Management [phone]

## 2019-01-23 ENCOUNTER — Other Ambulatory Visit: Payer: Self-pay

## 2019-01-23 DIAGNOSIS — I132 Hypertensive heart and chronic kidney disease with heart failure and with stage 5 chronic kidney disease, or end stage renal disease: Secondary | ICD-10-CM | POA: Diagnosis not present

## 2019-01-23 DIAGNOSIS — L03311 Cellulitis of abdominal wall: Secondary | ICD-10-CM | POA: Diagnosis not present

## 2019-01-23 DIAGNOSIS — M793 Panniculitis, unspecified: Secondary | ICD-10-CM | POA: Diagnosis not present

## 2019-01-23 DIAGNOSIS — J440 Chronic obstructive pulmonary disease with acute lower respiratory infection: Secondary | ICD-10-CM | POA: Diagnosis not present

## 2019-01-23 DIAGNOSIS — J101 Influenza due to other identified influenza virus with other respiratory manifestations: Secondary | ICD-10-CM | POA: Diagnosis not present

## 2019-01-23 DIAGNOSIS — E1122 Type 2 diabetes mellitus with diabetic chronic kidney disease: Secondary | ICD-10-CM | POA: Diagnosis not present

## 2019-01-23 NOTE — Patient Outreach (Signed)
[place] [facility]) Care Management  01/23/2019  [name] Oct 21, 1966 [id number]   Attempted outreach with [name]. Reported feeling well today but was with home health therapist at the time of the call. Agreeable to follow-up call within the next week.   PLAN Will follow up within a week.   [place] [phone]

## 2019-01-24 ENCOUNTER — Other Ambulatory Visit: Payer: Self-pay | Admitting: Pharmacy Technician

## 2019-01-24 DIAGNOSIS — L03311 Cellulitis of abdominal wall: Secondary | ICD-10-CM | POA: Diagnosis not present

## 2019-01-24 DIAGNOSIS — M793 Panniculitis, unspecified: Secondary | ICD-10-CM | POA: Diagnosis not present

## 2019-01-24 DIAGNOSIS — J101 Influenza due to other identified influenza virus with other respiratory manifestations: Secondary | ICD-10-CM | POA: Diagnosis not present

## 2019-01-24 DIAGNOSIS — I132 Hypertensive heart and chronic kidney disease with heart failure and with stage 5 chronic kidney disease, or end stage renal disease: Secondary | ICD-10-CM | POA: Diagnosis not present

## 2019-01-24 DIAGNOSIS — E1122 Type 2 diabetes mellitus with diabetic chronic kidney disease: Secondary | ICD-10-CM | POA: Diagnosis not present

## 2019-01-24 DIAGNOSIS — J440 Chronic obstructive pulmonary disease with acute lower respiratory infection: Secondary | ICD-10-CM | POA: Diagnosis not present

## 2019-01-24 NOTE — Patient Outreach (Signed)
[place] [facility]) Care Management  01/24/2019  JERRILYNN MIKOWSKI 1966-04-01 [id number]    Successful outreach call placed to patient in regards to [place] patient assistance application for basaglar and humalog.  Spoke to patient, HIPAA identifiers verified. Inquired if patient had received her applications in the mail, patient stated she had not and has been checking the mail daily. Informed patient I would prepare another set of applications to be mailed today or tomorrow. Patient was agreeable and said she would be looking for them.  Will followup with patient in 5-7 business days to verify receipt of applications.  Rosaly Labarbera P. Rykin Route, [place] Management [phone]

## 2019-01-26 DIAGNOSIS — M793 Panniculitis, unspecified: Secondary | ICD-10-CM | POA: Diagnosis not present

## 2019-01-26 DIAGNOSIS — I132 Hypertensive heart and chronic kidney disease with heart failure and with stage 5 chronic kidney disease, or end stage renal disease: Secondary | ICD-10-CM | POA: Diagnosis not present

## 2019-01-26 DIAGNOSIS — J101 Influenza due to other identified influenza virus with other respiratory manifestations: Secondary | ICD-10-CM | POA: Diagnosis not present

## 2019-01-26 DIAGNOSIS — J440 Chronic obstructive pulmonary disease with acute lower respiratory infection: Secondary | ICD-10-CM | POA: Diagnosis not present

## 2019-01-26 DIAGNOSIS — E1122 Type 2 diabetes mellitus with diabetic chronic kidney disease: Secondary | ICD-10-CM | POA: Diagnosis not present

## 2019-01-26 DIAGNOSIS — L03311 Cellulitis of abdominal wall: Secondary | ICD-10-CM | POA: Diagnosis not present

## 2019-01-29 ENCOUNTER — Other Ambulatory Visit: Payer: Self-pay

## 2019-01-29 LAB — FUNGUS CULTURE RESULT

## 2019-01-29 LAB — FUNGAL ORGANISM REFLEX

## 2019-01-29 LAB — FUNGUS CULTURE WITH STAIN

## 2019-01-29 NOTE — Patient Outreach (Signed)
[place] [facility]) Care Management  01/29/2019  [name] [birth year] [id number]   Successful outreach with [name]. She reported doing well and denied changes since last outreach. Unable to recall exact weight but stated morning reading was "normal" and within range. Continues dialysis in the home. Reported medication compliance and confirmed receipt of medication assistance documents from [place]. Informed RNCM that she intends to mail the required documents today.  Discussed care management needs and pending appointments. Reported that she was discharged from [facility] on last week and has pending appointment for last skilled nursing visit. She is scheduled for PCP follow up on next week. No transportation needs. Denied urgent concerns and agreed to contact RNCM with questions as needed.  PLAN Will continue routine outreach.  [place] [phone]

## 2019-01-30 ENCOUNTER — Other Ambulatory Visit: Payer: Self-pay | Admitting: Pharmacy Technician

## 2019-01-30 NOTE — Patient Outreach (Signed)
[place] [facility]) Care Management  01/30/2019  [name] 1966/02/16 [id number]   Successful outreach call placed to patient in regards to [place] application for Basaglar and Humalog.  Spoke to patient, HIPAA identifiers verified. Inquired if patient had received and mailed back the applications. Patient stated she spoke to a [place] yesterday and had told them that she had received the application. She informed that she intends to place in the mail today.  Will followup with patient in 7-14 business days if application has not been received back at [facility].  Elkin Belfield P. Sophiya Morello, [place] Management [phone]

## 2019-01-31 ENCOUNTER — Encounter: Payer: Self-pay | Admitting: Pharmacist

## 2019-01-31 DIAGNOSIS — M793 Panniculitis, unspecified: Secondary | ICD-10-CM | POA: Diagnosis not present

## 2019-01-31 DIAGNOSIS — J101 Influenza due to other identified influenza virus with other respiratory manifestations: Secondary | ICD-10-CM | POA: Diagnosis not present

## 2019-01-31 DIAGNOSIS — E1122 Type 2 diabetes mellitus with diabetic chronic kidney disease: Secondary | ICD-10-CM | POA: Diagnosis not present

## 2019-01-31 DIAGNOSIS — L03311 Cellulitis of abdominal wall: Secondary | ICD-10-CM | POA: Diagnosis not present

## 2019-01-31 DIAGNOSIS — I132 Hypertensive heart and chronic kidney disease with heart failure and with stage 5 chronic kidney disease, or end stage renal disease: Secondary | ICD-10-CM | POA: Diagnosis not present

## 2019-01-31 DIAGNOSIS — J440 Chronic obstructive pulmonary disease with acute lower respiratory infection: Secondary | ICD-10-CM | POA: Diagnosis not present

## 2019-02-06 DIAGNOSIS — I132 Hypertensive heart and chronic kidney disease with heart failure and with stage 5 chronic kidney disease, or end stage renal disease: Secondary | ICD-10-CM | POA: Diagnosis not present

## 2019-02-06 DIAGNOSIS — E1122 Type 2 diabetes mellitus with diabetic chronic kidney disease: Secondary | ICD-10-CM | POA: Diagnosis not present

## 2019-02-06 DIAGNOSIS — M793 Panniculitis, unspecified: Secondary | ICD-10-CM | POA: Diagnosis not present

## 2019-02-06 DIAGNOSIS — J440 Chronic obstructive pulmonary disease with acute lower respiratory infection: Secondary | ICD-10-CM | POA: Diagnosis not present

## 2019-02-06 DIAGNOSIS — L03311 Cellulitis of abdominal wall: Secondary | ICD-10-CM | POA: Diagnosis not present

## 2019-02-06 DIAGNOSIS — J101 Influenza due to other identified influenza virus with other respiratory manifestations: Secondary | ICD-10-CM | POA: Diagnosis not present

## 2019-02-07 DIAGNOSIS — L03311 Cellulitis of abdominal wall: Secondary | ICD-10-CM | POA: Diagnosis not present

## 2019-02-07 DIAGNOSIS — Z992 Dependence on renal dialysis: Secondary | ICD-10-CM | POA: Diagnosis not present

## 2019-02-07 DIAGNOSIS — I890 Lymphedema, not elsewhere classified: Secondary | ICD-10-CM | POA: Diagnosis not present

## 2019-02-07 DIAGNOSIS — N186 End stage renal disease: Secondary | ICD-10-CM | POA: Diagnosis not present

## 2019-02-07 DIAGNOSIS — I132 Hypertensive heart and chronic kidney disease with heart failure and with stage 5 chronic kidney disease, or end stage renal disease: Secondary | ICD-10-CM | POA: Diagnosis not present

## 2019-02-07 DIAGNOSIS — E1122 Type 2 diabetes mellitus with diabetic chronic kidney disease: Secondary | ICD-10-CM | POA: Diagnosis not present

## 2019-02-07 DIAGNOSIS — J101 Influenza due to other identified influenza virus with other respiratory manifestations: Secondary | ICD-10-CM | POA: Diagnosis not present

## 2019-02-07 DIAGNOSIS — Z85850 Personal history of malignant neoplasm of thyroid: Secondary | ICD-10-CM | POA: Diagnosis not present

## 2019-02-07 DIAGNOSIS — Z6841 Body Mass Index (BMI) 40.0 and over, adult: Secondary | ICD-10-CM | POA: Diagnosis not present

## 2019-02-07 DIAGNOSIS — I5032 Chronic diastolic (congestive) heart failure: Secondary | ICD-10-CM | POA: Diagnosis not present

## 2019-02-07 DIAGNOSIS — J9611 Chronic respiratory failure with hypoxia: Secondary | ICD-10-CM | POA: Diagnosis not present

## 2019-02-07 DIAGNOSIS — M793 Panniculitis, unspecified: Secondary | ICD-10-CM | POA: Diagnosis not present

## 2019-02-07 DIAGNOSIS — Z79899 Other long term (current) drug therapy: Secondary | ICD-10-CM | POA: Diagnosis not present

## 2019-02-07 DIAGNOSIS — J440 Chronic obstructive pulmonary disease with acute lower respiratory infection: Secondary | ICD-10-CM | POA: Diagnosis not present

## 2019-02-13 ENCOUNTER — Other Ambulatory Visit: Payer: Self-pay | Admitting: Pharmacy Technician

## 2019-02-13 NOTE — Patient Outreach (Signed)
[place] [facility]) Care Management  02/13/2019  [name] Nov 30, 1966 [id number]    Successful outreach call placed to patient in regards to her [name] patient assistance application for Basaglar and Humalog.  Spoke to patient, HIPAA identifiers verified. Informed patient that I have yet to receive her application back in the mail. Patient informed that she mailed them the day after she spoke to me sometime around Valentine's Day. She informed that this information was concerning as she had included the private documents that the PAP required. Informed patient that our mail is slow and that it has to go to the hospital first to be sorted before it comes to [place]. Informed patient I would be on the lookout for it and would contact her if I dont receive it.  Will followup with patient in 7-10 business days if application has not been received.  Raguel Kosloski P. Mikailah Morel, [place] Management [phone]

## 2019-02-14 DIAGNOSIS — L03311 Cellulitis of abdominal wall: Secondary | ICD-10-CM | POA: Diagnosis not present

## 2019-02-14 DIAGNOSIS — J101 Influenza due to other identified influenza virus with other respiratory manifestations: Secondary | ICD-10-CM | POA: Diagnosis not present

## 2019-02-14 DIAGNOSIS — I132 Hypertensive heart and chronic kidney disease with heart failure and with stage 5 chronic kidney disease, or end stage renal disease: Secondary | ICD-10-CM | POA: Diagnosis not present

## 2019-02-14 DIAGNOSIS — J440 Chronic obstructive pulmonary disease with acute lower respiratory infection: Secondary | ICD-10-CM | POA: Diagnosis not present

## 2019-02-14 DIAGNOSIS — M793 Panniculitis, unspecified: Secondary | ICD-10-CM | POA: Diagnosis not present

## 2019-02-14 DIAGNOSIS — E1122 Type 2 diabetes mellitus with diabetic chronic kidney disease: Secondary | ICD-10-CM | POA: Diagnosis not present

## 2019-02-16 ENCOUNTER — Other Ambulatory Visit: Payer: Self-pay | Admitting: Pharmacy Technician

## 2019-02-16 ENCOUNTER — Other Ambulatory Visit: Payer: Self-pay

## 2019-02-16 NOTE — Patient Outreach (Signed)
[name]) Care Management  02/16/2019  [name] Jan 11, 1966 [id number]   Successful outreach with [name]. Reported continued compliance with medications and blood glucose monitoring. Reported fasting blood glucose range of 90-125. Performing peritoneal dialysis in the home without difficulty. Reported increased generalized edema that resolved after her dialysis formula was adjusted. Her appetite has decreased since last outreach. Reports eating small meals and high protein snacks as recommended by Dr. Lowanda Foster. [name] denied urgent needs. She will continue outreach with [place] for medication assistance and contact RNCM with other concerns as needed.  THN CM Care Plan Problem One     Most Recent Value  Care Plan Problem One  High Risk for Readmission  Role Documenting the Problem One  Care Management [place] for Problem One  Active  THN Long Term Goal   Patient will not be hospitalized for chronic disease complications over the next 60 days.  THN Long Term Goal Start Date  01/09/19  Interventions for Problem One Long Term Goal  Discussed medications, monitoring and tolerance of peritoneal dialysis. Discussed s/sx of infections and symptoms that require immediate medical follow-up.  THN CM Short Term Goal #1   Over the next 30 days patient will attend all scheduled MD follow-ups.  THN CM Short Term Goal #1 Start Date  01/09/19  THN CM Short Term Goal #1 Met Date  02/16/19  THN CM Short Term Goal #2   Over the next 30 days patient will take all medications as prescribed.  THN CM Short Term Goal #2 Start Date  01/09/19  THN CM Short Term Goal #2 Met Date  02/16/19  THN CM Short Term Goal #3  Over the next 30 days patient will continue to weigh daily.  THN CM Short Term Goal #3 Start Date  01/09/19  [facility] CM Short Term Goal #3 Met Date  02/16/19     PLAN Will continue routine outreach.  [place] [phone]

## 2019-02-16 NOTE — Patient Outreach (Signed)
[place]) Care Management  02/16/2019  [name] [birth year] [id number]    Care coordination call placed to Dr. [name] office in regards to patient's Lilly application for Humalog and [employer].  Left a detailed message on the nurse's line in regards to missing parts of the provider portion of the application.  Will followup in 5-7 business days if call is not returned.  Alysse Rathe P. Deunte Bledsoe, [place] Management [phone]

## 2019-02-20 DIAGNOSIS — E1122 Type 2 diabetes mellitus with diabetic chronic kidney disease: Secondary | ICD-10-CM | POA: Diagnosis not present

## 2019-02-20 DIAGNOSIS — M793 Panniculitis, unspecified: Secondary | ICD-10-CM | POA: Diagnosis not present

## 2019-02-20 DIAGNOSIS — I132 Hypertensive heart and chronic kidney disease with heart failure and with stage 5 chronic kidney disease, or end stage renal disease: Secondary | ICD-10-CM | POA: Diagnosis not present

## 2019-02-20 DIAGNOSIS — J440 Chronic obstructive pulmonary disease with acute lower respiratory infection: Secondary | ICD-10-CM | POA: Diagnosis not present

## 2019-02-20 DIAGNOSIS — J101 Influenza due to other identified influenza virus with other respiratory manifestations: Secondary | ICD-10-CM | POA: Diagnosis not present

## 2019-02-20 DIAGNOSIS — L03311 Cellulitis of abdominal wall: Secondary | ICD-10-CM | POA: Diagnosis not present

## 2019-02-26 ENCOUNTER — Telehealth: Payer: Self-pay | Admitting: Pharmacist

## 2019-02-26 ENCOUNTER — Other Ambulatory Visit: Payer: Self-pay | Admitting: Pharmacy Technician

## 2019-02-26 NOTE — Patient Outreach (Signed)
[place] [facility]) Care Management  02/26/2019  [name] [birth year] [id number]   Care coordination call placed to Dr [name] office in regards to patient's Lilly application for Humalog and [employer].  Spoke to receptionist who transferred me to the nurse's line. Left detailed message on the nurse's line explaining what was needed for the patient assistance application.  Will followup in 5-7 business days if call is not returned.  [name], [place] Management [phone]

## 2019-02-26 NOTE — Patient Outreach (Signed)
[place] [facility]) Care Management  02/26/2019  [name] 07-31-1966 [id number]   Called patient to follow up on patient assistance forms. Patient reported mailing the forms we sent to her on 02/02/2019. Unfortunately, we have not received her forms. In addition, Sharee Pimple has been contacting Dr. [name] office to get the provider portion of the forms and has not been able to get them.  Patient did not answer the phone today. HIPAA compliant message was left on her voicemail.  Plan: Send unsuccessful contact letter Call patient back in 7-10 business days.   [name], PharmD, [place] Clinical Pharmacist [phone]

## 2019-02-27 ENCOUNTER — Other Ambulatory Visit: Payer: Self-pay | Admitting: Pharmacy Technician

## 2019-02-27 NOTE — Patient Outreach (Signed)
[place] [facility]) Care Management  02/27/2019  [name] January 30, 1966 [id number]   Successful outreach call placed to patient in regards to [place] application for Basaglar and Humalog.  Spoke to patient, HIPAA identifiers verified.  Informed patient that I have yet to receive her application back that she mailed around the 14th of February. Inquired if patient mailed it back in the envelope provided and inquired if she had written anything else on the envelope provided. Patient informed she sent it in the envelope that was provided and did not make any other markings on the envelope. Patient inquired if another application could be mailed out to her as she saved the copies of the documents that she had to submit,  Will mail out 3rd set of applications to patient and will followup in 5-7 business days to inquire if she has received them.  Evelise Reine P. Kathleen Likins, [place] Management [phone]

## 2019-02-28 DIAGNOSIS — E1122 Type 2 diabetes mellitus with diabetic chronic kidney disease: Secondary | ICD-10-CM | POA: Diagnosis not present

## 2019-02-28 DIAGNOSIS — L03311 Cellulitis of abdominal wall: Secondary | ICD-10-CM | POA: Diagnosis not present

## 2019-02-28 DIAGNOSIS — M793 Panniculitis, unspecified: Secondary | ICD-10-CM | POA: Diagnosis not present

## 2019-02-28 DIAGNOSIS — J440 Chronic obstructive pulmonary disease with acute lower respiratory infection: Secondary | ICD-10-CM | POA: Diagnosis not present

## 2019-02-28 DIAGNOSIS — J101 Influenza due to other identified influenza virus with other respiratory manifestations: Secondary | ICD-10-CM | POA: Diagnosis not present

## 2019-02-28 DIAGNOSIS — I132 Hypertensive heart and chronic kidney disease with heart failure and with stage 5 chronic kidney disease, or end stage renal disease: Secondary | ICD-10-CM | POA: Diagnosis not present

## 2019-03-05 ENCOUNTER — Other Ambulatory Visit: Payer: Self-pay | Admitting: Pharmacy Technician

## 2019-03-05 NOTE — Patient Outreach (Signed)
[facility]) Care Management  03/05/2019  TABATHA RAZZANO [birth year] [id number]   Received patient's part of the Lilly application back today. Spoke to [place] at Dr [employer] office and she said they had already faxed their portion into [employer].  Submitted patient portion of the application to [employer] today via fax.  Will followup with [name] in 5-7 business days to inquire if a determination has been made.  Arabelle Bollig P. Jabre Heo, [place] Management [phone]

## 2019-03-07 DIAGNOSIS — I132 Hypertensive heart and chronic kidney disease with heart failure and with stage 5 chronic kidney disease, or end stage renal disease: Secondary | ICD-10-CM | POA: Diagnosis not present

## 2019-03-07 DIAGNOSIS — J101 Influenza due to other identified influenza virus with other respiratory manifestations: Secondary | ICD-10-CM | POA: Diagnosis not present

## 2019-03-07 DIAGNOSIS — E1122 Type 2 diabetes mellitus with diabetic chronic kidney disease: Secondary | ICD-10-CM | POA: Diagnosis not present

## 2019-03-07 DIAGNOSIS — L03311 Cellulitis of abdominal wall: Secondary | ICD-10-CM | POA: Diagnosis not present

## 2019-03-07 DIAGNOSIS — J440 Chronic obstructive pulmonary disease with acute lower respiratory infection: Secondary | ICD-10-CM | POA: Diagnosis not present

## 2019-03-07 DIAGNOSIS — M793 Panniculitis, unspecified: Secondary | ICD-10-CM | POA: Diagnosis not present

## 2019-03-08 ENCOUNTER — Other Ambulatory Visit: Payer: Self-pay | Admitting: Pharmacist

## 2019-03-08 ENCOUNTER — Ambulatory Visit: Payer: Self-pay | Admitting: Pharmacist

## 2019-03-08 NOTE — Patient Outreach (Signed)
[place] [facility]) Care Management  03/08/2019  [name] [birth year] [id number]   Patient was called to follow up on medication assistance. Unfortunately, she did not answer the phone. HIPAA compliant message was left her voicemail.  Plan: Patient is being followed by [facility] Certified Technician, Susy Frizzle.  Will schedule patient out 3-4 weeks.   [name], PharmD, [place] Clinical Pharmacist [phone]

## 2019-03-09 ENCOUNTER — Other Ambulatory Visit: Payer: Self-pay | Admitting: Pharmacy Technician

## 2019-03-09 NOTE — Patient Outreach (Signed)
[place] [facility]) Care Management  03/09/2019  [name] 08/16/1966 [id number]   Care coordination call placed to [facility] in regards to patient's application for Basaglar and Humalog.  Spoke to [place] who said they had received the provider part of the application but was missing the the patient part of the application (the provider fax directly to [place] their part). Jolanda also informed that the provider did not put a strength on the Basaglar prescription and the Humalog prescription was missing the formulation type).  Care coordination call placed to Dr. Kathaleen Grinder office. Left a voicemail on the nurses's line concerning the patient assistance issues outlined above.  Will followup with provider in 2-3 business days if call is not returned.  Eino Whitner P. Briselda Naval, [place] Management [phone]

## 2019-03-09 NOTE — Patient Outreach (Signed)
[place] [facility]) Care Management  03/09/2019  [name] [birth year] [id number]  ADDENDUM  Successful outreach call to patient, HIPAA identifiers verified.  Informed patient that her information for the [facility] application for Humalog and Basaglar was received. Inquired if patient could contact Dr. [name] office as I was having difficulty contacting them. Patient informed she had a friend that worked in the office and she would advise them to get in touch.  Will followup with the providers office in 2-3 business days if call is not returned.

## 2019-03-12 ENCOUNTER — Other Ambulatory Visit: Payer: Self-pay

## 2019-03-12 NOTE — Patient Outreach (Signed)
[place] [facility]) Care Management  03/12/2019  [name] 1966/07/04 [id number]   Case Closure Successful outreach with [name]. She remains compliant with medications and treatment recommendations. Reports no complications with peritoneal dialysis since last formula change. She has progressed well and met her [facility] care management goals. She was discharged from home health nursing services on last week with all goals met. Reports no further community care management needs and agreeable to discharge. Will notify Dr. [name] if her health status changes and additional outreach is needed.   PLAN Will complete case closure. Will update [place] team.  Horris Latino [place] Manager [phone]

## 2019-03-14 ENCOUNTER — Other Ambulatory Visit: Payer: Self-pay | Admitting: Pharmacy Technician

## 2019-03-14 NOTE — Patient Outreach (Signed)
[place] [facility]) Care Management  03/14/2019  [name] 02/09/1966 [id number]  Care coordination call placed to [facility] in regards to patients application for Humalog and basaglar.  Spoke to [employer] who said they had all of the information except for the provider's consent form (the last page). [name] informed once they received that information then the processing could begin.  Will outreach provider's office to inform them of this information.  [name] [name] P. [name], [place] Management [phone]

## 2019-03-14 NOTE — Patient Outreach (Signed)
[facility]) Care Management  03/14/2019  [name] [birth year] [id number]  ADDENDUM  Care coordination placed to Dr Kathaleen Grinder office in regards to patient's Lilly application for [employer] and [employer] to [place]. Informed her that [employer] says they are missing the provider's consent form of the application. [name] said she would print it off now and fax it to [place] at [phone].  Will followup with Lilly in 2-5 business days to inquire on status of application.  [name], [place] Management [phone]

## 2019-03-19 ENCOUNTER — Other Ambulatory Visit: Payer: Self-pay | Admitting: Pharmacy Technician

## 2019-03-19 NOTE — Patient Outreach (Signed)
[place] [facility]) Care Management  03/19/2019  [name] 02/05/1966 [id number]  Care coordination call placed to [facility] in regards to patient's Humalog and Basaglar patient assistance.  Spoke to [place] who informed that the patient had been APPROVED for the [facility] 03/15/2019-12/20/2019. She informed that the initial order had been created but that the pharmacy ([facility]) needs a hardcopy prescription faxed to them at [phone].  Inquired about the Humalog and initially Leveda Anna said it was missing but upon further inspection she found the needed information and sent it over to expedited processing. She informed that a prescription would need to be faxed in for that medication as well.  Will outreach Dr. Kathaleen Grinder office to inform them to send in the prescription to [facility].  Will followup with Lilly in 2-3 business days to inquire about status of application and delivery.  Braydn Carneiro P. Arleta Ostrum, [place] Management [phone]

## 2019-03-19 NOTE — Patient Outreach (Signed)
[place] [facility]) Care Management  03/19/2019  [name] 1966/12/08 [id number]   Care coordination call placed to Dr. [name] office in regards to patient's [facility] application.  Spoke to [place] and informed her that [employer] needs the provider to fax in hardcopy scripts to their new pharmacy provider, [facility] at [phone]. Beck informed she would take care of this today.  Will followup with [name] in 2-3 business days to inquire on status of applications and delivery status.  [name] P. [name], [place] Management [phone]

## 2019-03-20 ENCOUNTER — Other Ambulatory Visit: Payer: Self-pay | Admitting: Pharmacy Technician

## 2019-03-20 DIAGNOSIS — F419 Anxiety disorder, unspecified: Secondary | ICD-10-CM | POA: Diagnosis not present

## 2019-03-20 DIAGNOSIS — M545 Low back pain: Secondary | ICD-10-CM | POA: Diagnosis not present

## 2019-03-20 NOTE — Patient Outreach (Signed)
[facility]) Care Management  03/20/2019  GARIMA CHRONIS 21-Apr-1966 [id number]   Care coordination call placed to [facility] in regards to patient's application for Basaglar and Humalog.  Spoke to [employer] who informed that the Humalog script was missing type of formulation.  Catelyn informed that the provider needs to fax into [facility] at [phone], call Lilly who can then transfer the call to [facility] or they can escribe the script.  Will followup with Dr. [name] office to confirm this has been done as they were notified on 03/19/2019 that this needed to be compelted before the medications could be shipped.  Maxene Byington P. Satia Winger, [place] Management [phone]

## 2019-03-21 ENCOUNTER — Other Ambulatory Visit: Payer: Self-pay | Admitting: Pharmacy Technician

## 2019-03-21 DIAGNOSIS — Z0000 Encounter for general adult medical examination without abnormal findings: Secondary | ICD-10-CM | POA: Diagnosis not present

## 2019-03-21 NOTE — Patient Outreach (Signed)
[place] [facility]) Care Management  03/21/2019  [name] November 01, 1966 [id number]  Care coordination call placed to [facility] at Dr [name] office in regards to patient's application for Humalog and [place].  Spoke to [place]. Informed [name] that per [facility] they are in need of clarification on the Humalog script. They need for Dr. [name] to specify Pens or Vials and fax it back to [phone]. The Basaglar rx needs to say pens as well per the representative. [name] verbalized understanding and will complete the request today.  Will followup with Lilly in 2-3 business days to confirm receipt of the clarified rx.  [name] P. [name], [place] Management [phone]

## 2019-03-23 ENCOUNTER — Other Ambulatory Visit: Payer: Self-pay | Admitting: Pharmacy Technician

## 2019-03-23 NOTE — Patient Outreach (Signed)
[place] [facility]) Care Management  03/23/2019  KEARSTIN LEARN 13-Nov-1966 [id number]   Care coordination call placed to [facility] in reference to her applications for Basaglar and Humalog.  Spoke to [place] who said the patient was APPROVED for Humalog 03/21/2019-12/20/2019 and Basaglar 03/15/2019-12/20/2019. She informed that the patient should be receiving 10 boxes of the Humalog pens and Apolonio Schneiders believed she would be receiving 5 Basaglar. Apolonio Schneiders informed that the had no tracking number to provide but that the medication should be delivered to the office in 2-3 business days from today.  Will followup with patient in 5-7 business days to confirm receipt of medication.  Trapper Meech P. Eliska Hamil, [place] Management [phone]

## 2019-03-28 ENCOUNTER — Other Ambulatory Visit: Payer: Self-pay | Admitting: Pharmacy Technician

## 2019-03-28 NOTE — Patient Outreach (Signed)
[place] [facility]) Care Management  03/28/2019  [name] 28-Dec-1965 [id number]  Care coordination call placed to Dr. [name] office in regards to patient's Basaglar and Humalog application.  Spoke to [place] and informed her that [employer] is requiring provider's to call in and give verbals for the products. Provided [name] the phone number [phone]. Requested [name] call me back with the name of the person she spoke to so I could have that for future followup calls.  Will followup with Lilly after hearing back from [place] at Dr [name] office.  [name] P. [name], [place] Management [phone]

## 2019-03-29 ENCOUNTER — Ambulatory Visit: Payer: Self-pay | Admitting: Pharmacist

## 2019-04-02 ENCOUNTER — Other Ambulatory Visit: Payer: Self-pay | Admitting: Pharmacy Technician

## 2019-04-02 NOTE — Patient Outreach (Signed)
[place] [facility]) Care Management  04/02/2019  SCOTLYN MCCRANIE 26-May-1966 [id number]  Care coordination call placed to [place] in regards to patient's application for Basaglar and Humalog.  Spoke to [place] who said the pharmacy received the phone call form the provider's office and the order is in shipping status. She informed that means they are getting it ready for delivery. She informed that they will also reach out to either the patient or the provider for a delivery time. She was unable to tell me where this order was being delivered to and she was unable to provide me a delivery time window. She did inform that once that contact had been made and established then the medication is sent over night.  Will followup with patient in 5-7 business days to confirm if the medication has been delivered.  Tramell Piechota P. Kupono Marling, [place] Management [phone]

## 2019-04-03 ENCOUNTER — Other Ambulatory Visit: Payer: Self-pay | Admitting: Pharmacy Technician

## 2019-04-03 NOTE — Patient Outreach (Signed)
[place] [facility]) Care Management  04/03/2019  [name] 07-28-1966 [id number]   Care coordination call placed to [facility] in regards to patient's application for Basaglar and Humalog.  Spoke to [place] who was unsure why the order had not been delivered. She informed that she would transfer me over to [facility].  Spoke to pharmacist Renee at [facility] who informed they were missing the Humalog prescription. Informed Renee that the prescription was escribed, faxed and called in multiple times in the past 2 weeks. Renee apologized and said due to colleagues working remotely some things are not running as smoothly as normal. She was able to locate the Humalog and asked if it was for the u-100 pens with directions of 60 units bid, qty 4 months, refills 1 year.. Confirmed that was correct according to the application submitted. Renee informed that both the Humalog and Basaglar would be shipped out to the patient and will arrive at the patient's home on Thursday 04/05/2019.  Will followup with patient in 2-3 busniess days to confirm receipt of medication.  Mikle Sternberg P. Aeric Burnham, [place] Management [phone]

## 2019-04-06 ENCOUNTER — Other Ambulatory Visit: Payer: Self-pay | Admitting: Pharmacy Technician

## 2019-04-06 NOTE — Patient Outreach (Signed)
[place] [facility]) Care Management  04/06/2019  [name] 1966-07-07 [id number]  Successful outreach call placed to patient in regards to [place] application for Basaglar and Humalog.  Spoke to patient, HIPAA identifiers verified. Patient informed that she did not received the medications as UPS tracking stated. The tracking number for Humalog [id number] and Nancee Liter [id number] both indicate that the medication was delivered on 04/05/2019 at 10:03 am. However the location just says [place] and the signaure section says "on file"   Outreached provider's office to see if they delivered it there but the provider's office was closed. Patient said she a nurse friend at the provider's office and she would outreach her.  Informed patient I would outreach [employer] but in the meantime to be expecting a call from them to set up another shipment of medication. Patient verbalized understanding.  Lenardo Westwood P. Earlena Werst, [place] Management [phone]

## 2019-04-06 NOTE — Patient Outreach (Signed)
[facility]) Care Management  04/06/2019  [name] 18-Sep-1966 [id number]  ADDENDUM  Care coordination call placed to [place] in regards to patient's application for Basaglar and Humalog.  Spoke to [place] who informed she would reach out to [employer]. Sharyn Lull informed that the representative at [facility] would reach out to the patient as both medications show delivered on 04/05/2019. If patient advises [facility] that the medication was not received then they will speak to patient about possibly making another delivery.  Will followup with [name] in 3-5 business days to inquire about delivery.  Raudel Bazen P. Janeah Kovacich, [place] Management [phone]

## 2019-04-10 ENCOUNTER — Other Ambulatory Visit: Payer: Self-pay | Admitting: Pharmacy Technician

## 2019-04-10 NOTE — Patient Outreach (Signed)
[facility]) Care Management  04/10/2019  [name] 1966/11/09 [id number]    Successful outreach call placed to patient in regards to [employer] application for Basaglar and Humalog.  Spoke to patient, HIPAA identifiers verified.  Patient informed that she had picked up 1 box of Basaglar and 10 boxes of Humalog from the provider's office. Discussed with patient how to obtain her refills and the importance of not running out of medication. Suggested patient phone her refills into [facility] when she has approximately a 2 week supply left. Patient verbalized understanding. Patient confirmed having my name and number for future issues relating to patient assistance. Patient informed she had no other questions or concerns at this time.  Will route note to [place] for case closure and remove myself from care team as patient assistance has been completed.  [name], [place] Management [phone]

## 2019-04-11 ENCOUNTER — Telehealth: Payer: Self-pay | Admitting: Pharmacist

## 2019-04-11 NOTE — Patient Outreach (Signed)
[facility]) Care Management  04/11/2019  [name] November 30, 1966 [id number]   Patient's case is being closed because she received Basaglar and Humalog from [employer] at no cost as is approved through the end of the year. She communicated understanding of how to reorder her medications from [facility].  Plan: Close pharmacy case and send closure letters to patient and her PCP.   [name], PharmD, [place] Clinical Pharmacist [phone]

## 2019-04-19 ENCOUNTER — Ambulatory Visit: Payer: Self-pay | Admitting: Pharmacist

## 2019-04-20 ENCOUNTER — Encounter (HOSPITAL_COMMUNITY): Payer: Self-pay | Admitting: Emergency Medicine

## 2019-04-20 ENCOUNTER — Other Ambulatory Visit: Payer: Self-pay

## 2019-04-20 ENCOUNTER — Inpatient Hospital Stay (HOSPITAL_COMMUNITY)
Admission: EM | Admit: 2019-04-20 | Discharge: 2019-04-30 | DRG: 907 | Disposition: A | Discharge status: Home Health | Payer: Medicare Other | Attending: Internal Medicine | Admitting: Internal Medicine

## 2019-04-20 ENCOUNTER — Emergency Department (HOSPITAL_COMMUNITY): Payer: Medicare Other

## 2019-04-20 DIAGNOSIS — Z887 Allergy status to serum and vaccine status: Secondary | ICD-10-CM

## 2019-04-20 DIAGNOSIS — Z823 Family history of stroke: Secondary | ICD-10-CM

## 2019-04-20 DIAGNOSIS — L03311 Cellulitis of abdominal wall: Secondary | ICD-10-CM | POA: Diagnosis present

## 2019-04-20 DIAGNOSIS — Y838 Other surgical procedures as the cause of abnormal reaction of the patient, or of later complication, without mention of misadventure at the time of the procedure: Secondary | ICD-10-CM | POA: Diagnosis present

## 2019-04-20 DIAGNOSIS — I2510 Atherosclerotic heart disease of native coronary artery without angina pectoris: Secondary | ICD-10-CM | POA: Diagnosis present

## 2019-04-20 DIAGNOSIS — K659 Peritonitis, unspecified: Secondary | ICD-10-CM | POA: Diagnosis present

## 2019-04-20 DIAGNOSIS — Z800 Family history of malignant neoplasm of digestive organs: Secondary | ICD-10-CM | POA: Diagnosis not present

## 2019-04-20 DIAGNOSIS — R1110 Vomiting, unspecified: Secondary | ICD-10-CM | POA: Diagnosis not present

## 2019-04-20 DIAGNOSIS — Z992 Dependence on renal dialysis: Secondary | ICD-10-CM

## 2019-04-20 DIAGNOSIS — R109 Unspecified abdominal pain: Secondary | ICD-10-CM | POA: Diagnosis present

## 2019-04-20 DIAGNOSIS — T8571XA Infection and inflammatory reaction due to peritoneal dialysis catheter, initial encounter: Secondary | ICD-10-CM | POA: Diagnosis present

## 2019-04-20 DIAGNOSIS — J9610 Chronic respiratory failure, unspecified whether with hypoxia or hypercapnia: Secondary | ICD-10-CM | POA: Diagnosis present

## 2019-04-20 DIAGNOSIS — N2581 Secondary hyperparathyroidism of renal origin: Secondary | ICD-10-CM | POA: Diagnosis present

## 2019-04-20 DIAGNOSIS — I5032 Chronic diastolic (congestive) heart failure: Secondary | ICD-10-CM | POA: Diagnosis present

## 2019-04-20 DIAGNOSIS — J449 Chronic obstructive pulmonary disease, unspecified: Secondary | ICD-10-CM | POA: Diagnosis present

## 2019-04-20 DIAGNOSIS — E7800 Pure hypercholesterolemia, unspecified: Secondary | ICD-10-CM | POA: Diagnosis present

## 2019-04-20 DIAGNOSIS — M797 Fibromyalgia: Secondary | ICD-10-CM | POA: Diagnosis present

## 2019-04-20 DIAGNOSIS — K5900 Constipation, unspecified: Secondary | ICD-10-CM | POA: Diagnosis present

## 2019-04-20 DIAGNOSIS — I120 Hypertensive chronic kidney disease with stage 5 chronic kidney disease or end stage renal disease: Secondary | ICD-10-CM | POA: Diagnosis not present

## 2019-04-20 DIAGNOSIS — Z881 Allergy status to other antibiotic agents status: Secondary | ICD-10-CM

## 2019-04-20 DIAGNOSIS — N186 End stage renal disease: Secondary | ICD-10-CM | POA: Diagnosis present

## 2019-04-20 DIAGNOSIS — I132 Hypertensive heart and chronic kidney disease with heart failure and with stage 5 chronic kidney disease, or end stage renal disease: Secondary | ICD-10-CM | POA: Diagnosis present

## 2019-04-20 DIAGNOSIS — E0800 Diabetes mellitus due to underlying condition with hyperosmolarity without nonketotic hyperglycemic-hyperosmolar coma (NKHHC): Secondary | ICD-10-CM

## 2019-04-20 DIAGNOSIS — R1084 Generalized abdominal pain: Secondary | ICD-10-CM | POA: Diagnosis not present

## 2019-04-20 DIAGNOSIS — Z794 Long term (current) use of insulin: Secondary | ICD-10-CM

## 2019-04-20 DIAGNOSIS — E119 Type 2 diabetes mellitus without complications: Secondary | ICD-10-CM

## 2019-04-20 DIAGNOSIS — R52 Pain, unspecified: Secondary | ICD-10-CM | POA: Diagnosis not present

## 2019-04-20 DIAGNOSIS — Z6841 Body Mass Index (BMI) 40.0 and over, adult: Secondary | ICD-10-CM | POA: Diagnosis not present

## 2019-04-20 DIAGNOSIS — K5730 Diverticulosis of large intestine without perforation or abscess without bleeding: Secondary | ICD-10-CM | POA: Diagnosis not present

## 2019-04-20 DIAGNOSIS — E662 Morbid (severe) obesity with alveolar hypoventilation: Secondary | ICD-10-CM | POA: Diagnosis present

## 2019-04-20 DIAGNOSIS — E785 Hyperlipidemia, unspecified: Secondary | ICD-10-CM | POA: Diagnosis present

## 2019-04-20 DIAGNOSIS — Z8249 Family history of ischemic heart disease and other diseases of the circulatory system: Secondary | ICD-10-CM | POA: Diagnosis not present

## 2019-04-20 DIAGNOSIS — G8929 Other chronic pain: Secondary | ICD-10-CM | POA: Diagnosis present

## 2019-04-20 DIAGNOSIS — R112 Nausea with vomiting, unspecified: Secondary | ICD-10-CM | POA: Diagnosis not present

## 2019-04-20 DIAGNOSIS — T85611A Breakdown (mechanical) of intraperitoneal dialysis catheter, initial encounter: Secondary | ICD-10-CM

## 2019-04-20 DIAGNOSIS — I272 Pulmonary hypertension, unspecified: Secondary | ICD-10-CM | POA: Diagnosis present

## 2019-04-20 DIAGNOSIS — E1122 Type 2 diabetes mellitus with diabetic chronic kidney disease: Secondary | ICD-10-CM | POA: Diagnosis present

## 2019-04-20 DIAGNOSIS — R1032 Left lower quadrant pain: Secondary | ICD-10-CM | POA: Diagnosis not present

## 2019-04-20 DIAGNOSIS — N184 Chronic kidney disease, stage 4 (severe): Secondary | ICD-10-CM

## 2019-04-20 DIAGNOSIS — I10 Essential (primary) hypertension: Secondary | ICD-10-CM | POA: Diagnosis present

## 2019-04-20 DIAGNOSIS — E876 Hypokalemia: Secondary | ICD-10-CM | POA: Diagnosis present

## 2019-04-20 DIAGNOSIS — F419 Anxiety disorder, unspecified: Secondary | ICD-10-CM | POA: Diagnosis present

## 2019-04-20 DIAGNOSIS — R110 Nausea: Secondary | ICD-10-CM | POA: Diagnosis not present

## 2019-04-20 DIAGNOSIS — N189 Chronic kidney disease, unspecified: Secondary | ICD-10-CM

## 2019-04-20 DIAGNOSIS — Z91041 Radiographic dye allergy status: Secondary | ICD-10-CM

## 2019-04-20 DIAGNOSIS — R1111 Vomiting without nausea: Secondary | ICD-10-CM | POA: Diagnosis not present

## 2019-04-20 DIAGNOSIS — D631 Anemia in chronic kidney disease: Secondary | ICD-10-CM | POA: Diagnosis present

## 2019-04-20 DIAGNOSIS — I959 Hypotension, unspecified: Secondary | ICD-10-CM | POA: Diagnosis present

## 2019-04-20 LAB — CBC
HCT: 33.1 % — ABNORMAL LOW (ref 36.0–46.0)
Hemoglobin: 10.8 g/dL — ABNORMAL LOW (ref 12.0–15.0)
MCH: 31.9 pg (ref 26.0–34.0)
MCHC: 32.6 g/dL (ref 30.0–36.0)
MCV: 97.6 fL (ref 80.0–100.0)
Platelets: 288 10*3/uL (ref 150–400)
RBC: 3.39 MIL/uL — ABNORMAL LOW (ref 3.87–5.11)
RDW: 14.1 % (ref 11.5–15.5)
WBC: 13.2 10*3/uL — ABNORMAL HIGH (ref 4.0–10.5)
nRBC: 0 % (ref 0.0–0.2)

## 2019-04-20 LAB — GLUCOSE, CAPILLARY
Glucose-Capillary: 115 mg/dL — ABNORMAL HIGH (ref 70–99)
Glucose-Capillary: 131 mg/dL — ABNORMAL HIGH (ref 70–99)
Glucose-Capillary: 123 mg/dL — ABNORMAL HIGH (ref 70–99)

## 2019-04-20 LAB — CBC WITH DIFFERENTIAL/PLATELET
Abs Immature Granulocytes: 0.16 10*3/uL — ABNORMAL HIGH (ref 0.00–0.07)
Basophils Absolute: 0 10*3/uL (ref 0.0–0.1)
Basophils Relative: 0 %
Eosinophils Absolute: 0.1 10*3/uL (ref 0.0–0.5)
Eosinophils Relative: 0 %
HCT: 34.6 % — ABNORMAL LOW (ref 36.0–46.0)
Hemoglobin: 11 g/dL — ABNORMAL LOW (ref 12.0–15.0)
Immature Granulocytes: 1 %
Lymphocytes Relative: 7 %
Lymphs Abs: 1.1 10*3/uL (ref 0.7–4.0)
MCH: 31.7 pg (ref 26.0–34.0)
MCHC: 31.8 g/dL (ref 30.0–36.0)
MCV: 99.7 fL (ref 80.0–100.0)
Monocytes Absolute: 0.7 10*3/uL (ref 0.1–1.0)
Monocytes Relative: 5 %
Neutro Abs: 12.7 10*3/uL — ABNORMAL HIGH (ref 1.7–7.7)
Neutrophils Relative %: 87 %
Platelets: 316 10*3/uL (ref 150–400)
RBC: 3.47 MIL/uL — ABNORMAL LOW (ref 3.87–5.11)
RDW: 14.4 % (ref 11.5–15.5)
WBC: 14.8 10*3/uL — ABNORMAL HIGH (ref 4.0–10.5)
nRBC: 0 % (ref 0.0–0.2)

## 2019-04-20 LAB — BODY FLUID CELL COUNT WITH DIFFERENTIAL
Eos, Fluid: 9 %
Lymphs, Fluid: 17 %
Monocyte-Macrophage-Serous Fluid: 58 % (ref 50–90)
Neutrophil Count, Fluid: 16 % (ref 0–25)
Total Nucleated Cell Count, Fluid: 438 cu mm (ref 0–1000)

## 2019-04-20 LAB — CREATININE, SERUM
Creatinine, Ser: 9.27 mg/dL — ABNORMAL HIGH (ref 0.44–1.00)
GFR calc Af Amer: 5 mL/min — ABNORMAL LOW (ref >60)
GFR calc non Af Amer: 4 mL/min — ABNORMAL LOW (ref >60)

## 2019-04-20 LAB — GRAM STAIN: Special Requests: NORMAL

## 2019-04-20 LAB — HEMOGLOBIN A1C
Hgb A1c MFr Bld: 7.1 % — ABNORMAL HIGH (ref 4.8–5.6)
Mean Plasma Glucose: 157.07 mg/dL

## 2019-04-20 LAB — COMPREHENSIVE METABOLIC PANEL
ALT: 14 U/L (ref 0–44)
AST: 14 U/L — ABNORMAL LOW (ref 15–41)
Albumin: 2.3 g/dL — ABNORMAL LOW (ref 3.5–5.0)
Alkaline Phosphatase: 136 U/L — ABNORMAL HIGH (ref 38–126)
Anion gap: 16 — ABNORMAL HIGH (ref 5–15)
BUN: 41 mg/dL — ABNORMAL HIGH (ref 6–20)
CO2: 25 mmol/L (ref 22–32)
Calcium: 8.1 mg/dL — ABNORMAL LOW (ref 8.9–10.3)
Chloride: 90 mmol/L — ABNORMAL LOW (ref 98–111)
Creatinine, Ser: 9.49 mg/dL — ABNORMAL HIGH (ref 0.44–1.00)
GFR calc Af Amer: 5 mL/min — ABNORMAL LOW (ref >60)
GFR calc non Af Amer: 4 mL/min — ABNORMAL LOW (ref >60)
Glucose, Bld: 177 mg/dL — ABNORMAL HIGH (ref 70–99)
Potassium: 3 mmol/L — ABNORMAL LOW (ref 3.5–5.1)
Sodium: 131 mmol/L — ABNORMAL LOW (ref 135–145)
Total Bilirubin: 0.6 mg/dL (ref 0.3–1.2)
Total Protein: 6.5 g/dL (ref 6.5–8.1)

## 2019-04-20 LAB — MAGNESIUM
Magnesium: 1.5 mg/dL — ABNORMAL LOW (ref 1.7–2.4)
Magnesium: 1.8 mg/dL (ref 1.7–2.4)

## 2019-04-20 LAB — LIPASE, BLOOD: Lipase: 24 U/L (ref 11–51)

## 2019-04-20 LAB — PATHOLOGIST SMEAR REVIEW

## 2019-04-20 LAB — PHOSPHORUS: Phosphorus: 5.7 mg/dL — ABNORMAL HIGH (ref 2.5–4.6)

## 2019-04-20 MED ORDER — SODIUM CHLORIDE 0.9 % IV BOLUS
500.0000 mL | Freq: Once | INTRAVENOUS | Status: AC
  Administered 2019-04-20: 500 mL via INTRAVENOUS

## 2019-04-20 MED ORDER — OXYCODONE HCL 5 MG PO TABS
10.0000 mg | ORAL_TABLET | Freq: Once | ORAL | Status: AC
  Administered 2019-04-20: 22:00:00 10 mg via ORAL
  Filled 2019-04-20: qty 2

## 2019-04-20 MED ORDER — DELFLEX-LC/2.5% DEXTROSE 394 MOSM/L IP SOLN: INTRAPERITONEAL | Status: DC

## 2019-04-20 MED ORDER — INSULIN ASPART 100 UNIT/ML ~~LOC~~ SOLN: 0.0000 [IU] | Freq: Every day | SUBCUTANEOUS | Status: DC

## 2019-04-20 MED ORDER — LANTHANUM CARBONATE 500 MG PO CHEW: 1000.0000 mg | CHEWABLE_TABLET | Freq: Every day | ORAL | Status: DC

## 2019-04-20 MED ORDER — PROCHLORPERAZINE EDISYLATE 10 MG/2ML IJ SOLN
10.0000 mg | Freq: Once | INTRAMUSCULAR | Status: AC
  Administered 2019-04-20: 06:00:00 10 mg via INTRAVENOUS
  Filled 2019-04-20: qty 2

## 2019-04-20 MED ORDER — ONDANSETRON HCL 4 MG/2ML IJ SOLN
4.0000 mg | Freq: Four times a day (QID) | INTRAMUSCULAR | Status: DC | PRN
  Administered 2019-04-20 – 2019-04-24 (×8): 4 mg via INTRAVENOUS
  Filled 2019-04-20 (×9): qty 2

## 2019-04-20 MED ORDER — ACETAMINOPHEN 325 MG PO TABS
650.0000 mg | ORAL_TABLET | Freq: Four times a day (QID) | ORAL | Status: DC | PRN
  Filled 2019-04-20 (×2): qty 2

## 2019-04-20 MED ORDER — VANCOMYCIN VARIABLE DOSE PER UNSTABLE RENAL FUNCTION (PHARMACIST DOSING): Status: DC

## 2019-04-20 MED ORDER — DELFLEX-LC/1.5% DEXTROSE 344 MOSM/L IP SOLN: INTRAPERITONEAL | Status: DC

## 2019-04-20 MED ORDER — ALBUTEROL SULFATE (2.5 MG/3ML) 0.083% IN NEBU: 2.5000 mg | INHALATION_SOLUTION | Freq: Four times a day (QID) | RESPIRATORY_TRACT | Status: DC | PRN

## 2019-04-20 MED ORDER — HEPARIN 1000 UNIT/ML FOR PERITONEAL DIALYSIS: 500.0000 [IU] | INTRAMUSCULAR | Status: DC | PRN

## 2019-04-20 MED ORDER — PIPERACILLIN-TAZOBACTAM 3.375 G IVPB
3.3750 g | Freq: Two times a day (BID) | INTRAVENOUS | Status: DC
  Administered 2019-04-20 – 2019-04-21 (×3): 3.375 g via INTRAVENOUS
  Filled 2019-04-20 (×4): qty 50

## 2019-04-20 MED ORDER — ONDANSETRON HCL 4 MG PO TABS: 4.0000 mg | ORAL_TABLET | Freq: Four times a day (QID) | ORAL | Status: DC | PRN

## 2019-04-20 MED ORDER — SENNA 8.6 MG PO TABS
1.0000 | ORAL_TABLET | Freq: Every day | ORAL | Status: DC
  Administered 2019-04-21: 8.6 mg via ORAL
  Filled 2019-04-20: qty 1

## 2019-04-20 MED ORDER — VANCOMYCIN HCL 10 G IV SOLR
2500.0000 mg | Freq: Once | INTRAVENOUS | Status: AC
  Administered 2019-04-20: 2500 mg via INTRAVENOUS
  Filled 2019-04-20: qty 2500

## 2019-04-20 MED ORDER — HEPARIN SODIUM (PORCINE) 5000 UNIT/ML IJ SOLN
5000.0000 [IU] | Freq: Three times a day (TID) | INTRAMUSCULAR | Status: DC
  Administered 2019-04-20 – 2019-04-30 (×30): 5000 [IU] via SUBCUTANEOUS
  Filled 2019-04-20 (×30): qty 1

## 2019-04-20 MED ORDER — HEPARIN 1000 UNIT/ML FOR PERITONEAL DIALYSIS
INTRAPERITONEAL | Status: DC | PRN
  Filled 2019-04-20: qty 5000

## 2019-04-20 MED ORDER — LANTHANUM CARBONATE 500 MG PO CHEW
1000.0000 mg | CHEWABLE_TABLET | ORAL | Status: DC
  Administered 2019-04-20 – 2019-04-23 (×3): 1000 mg via ORAL

## 2019-04-20 MED ORDER — GENTAMICIN SULFATE 0.1 % EX CREA
1.0000 "application " | TOPICAL_CREAM | Freq: Every day | CUTANEOUS | Status: DC
  Administered 2019-04-21: 1 via TOPICAL
  Filled 2019-04-20 (×2): qty 15

## 2019-04-20 MED ORDER — POTASSIUM CHLORIDE CRYS ER 20 MEQ PO TBCR
40.0000 meq | EXTENDED_RELEASE_TABLET | Freq: Once | ORAL | Status: AC
  Administered 2019-04-20: 40 meq via ORAL
  Filled 2019-04-20: qty 2

## 2019-04-20 MED ORDER — SODIUM CHLORIDE 0.9 % IV SOLN
2.0000 g | Freq: Once | INTRAVENOUS | Status: AC
  Administered 2019-04-20: 2 g via INTRAVENOUS
  Filled 2019-04-20: qty 2

## 2019-04-20 MED ORDER — ALPRAZOLAM 0.5 MG PO TABS
0.5000 mg | ORAL_TABLET | Freq: Two times a day (BID) | ORAL | Status: DC | PRN
  Administered 2019-04-24 – 2019-04-27 (×2): 0.5 mg via ORAL
  Filled 2019-04-20 (×2): qty 1

## 2019-04-20 MED ORDER — MAGNESIUM CITRATE PO SOLN
1.0000 | Freq: Once | ORAL | Status: AC
  Administered 2019-04-20: 1 via ORAL
  Filled 2019-04-20: qty 296

## 2019-04-20 MED ORDER — INSULIN DETEMIR 100 UNIT/ML ~~LOC~~ SOLN
5.0000 [IU] | Freq: Every day | SUBCUTANEOUS | Status: DC
  Administered 2019-04-20 – 2019-04-22 (×3): 5 [IU] via SUBCUTANEOUS
  Filled 2019-04-20 (×5): qty 0.05

## 2019-04-20 MED ORDER — RENA-VITE PO TABS
1.0000 | ORAL_TABLET | Freq: Every day | ORAL | Status: DC
  Administered 2019-04-20 – 2019-04-29 (×10): 1 via ORAL
  Filled 2019-04-20 (×10): qty 1

## 2019-04-20 MED ORDER — MORPHINE SULFATE (PF) 4 MG/ML IV SOLN
4.0000 mg | Freq: Once | INTRAVENOUS | Status: AC
  Administered 2019-04-20: 4 mg via INTRAVENOUS
  Filled 2019-04-20: qty 1

## 2019-04-20 MED ORDER — MAGNESIUM SULFATE 2 GM/50ML IV SOLN
2.0000 g | Freq: Once | INTRAVENOUS | Status: AC
  Administered 2019-04-20: 2 g via INTRAVENOUS
  Filled 2019-04-20: qty 50

## 2019-04-20 MED ORDER — INSULIN ASPART 100 UNIT/ML ~~LOC~~ SOLN
0.0000 [IU] | Freq: Three times a day (TID) | SUBCUTANEOUS | Status: DC
  Administered 2019-04-20 – 2019-04-29 (×6): 1 [IU] via SUBCUTANEOUS

## 2019-04-20 MED ORDER — ONDANSETRON HCL 4 MG/2ML IJ SOLN
4.0000 mg | Freq: Once | INTRAMUSCULAR | Status: AC
  Administered 2019-04-20: 4 mg via INTRAVENOUS
  Filled 2019-04-20: qty 2

## 2019-04-20 MED ORDER — POTASSIUM CHLORIDE CRYS ER 20 MEQ PO TBCR
40.0000 meq | EXTENDED_RELEASE_TABLET | Freq: Every day | ORAL | Status: DC
  Administered 2019-04-20 – 2019-04-23 (×4): 40 meq via ORAL
  Filled 2019-04-20 (×4): qty 2

## 2019-04-20 MED ORDER — CALCITRIOL 0.5 MCG PO CAPS
0.5000 ug | ORAL_CAPSULE | Freq: Every day | ORAL | Status: DC
  Administered 2019-04-20 – 2019-04-30 (×11): 0.5 ug via ORAL
  Filled 2019-04-20 (×11): qty 1

## 2019-04-20 MED ORDER — SODIUM CHLORIDE 0.9 % IV SOLN
INTRAVENOUS | Status: DC
  Administered 2019-04-20 – 2019-04-22 (×4): via INTRAVENOUS

## 2019-04-20 MED ORDER — LANTHANUM CARBONATE 500 MG PO CHEW
1500.0000 mg | CHEWABLE_TABLET | Freq: Three times a day (TID) | ORAL | Status: DC
  Administered 2019-04-21 – 2019-04-25 (×10): 1500 mg via ORAL
  Filled 2019-04-20 (×14): qty 3

## 2019-04-20 NOTE — ED Notes (Signed)
Patient transported to CT 

## 2019-04-20 NOTE — ED Notes (Signed)
Date and time results received: 04/20/19 0407  Test: Gram Stain Peritoneal Fluid Critical Value: No Organism Seen. WBC Present - PMN  Name of Provider Notified: Roxanne Mins, MD

## 2019-04-20 NOTE — H&P (Signed)
History and Physical    [name] SWN:[id number] DOB: 1965-12-21 DOA: 04/20/2019  Referring MD/NP/PA: Dr. Stark Jock PCP: [name], MD  Patient coming from: Home  Chief Complaint: Abdominal pain, nausea, vomiting  HPI: [name] is a 53 y.o. female with a past medical history significant for anxiety, chronic diastolic heart failure, history of fibromyalgia, type 2 diabetes mellitus chronically on insulin with nephropathy, chronic respiratory failure, morbid obesity with hypoventilation syndrome and end-stage renal disease on peritoneal dialysis; who presented to the emergency department secondary to acute development of abdominal pain (left lower quadrant), 9 out of 10 in intensity when first presented with subsequent improvement to 4-5 out of 10; associated nausea/vomiting (no blood appreciated in vomit content), nothing makes condition better or worse.  Patient reports partially being able to initiate her nocturnal peritoneal dialysis treatment.  Patient also expressed some ongoing constipation and decrease in her appetite. She denies any fever, chills, cough, chest pain, shortness of breath, melena, hematochezia, focal weakness, sick contacts or any other complaints.  In the ED patient was found with a low blood pressure, elevated WBCs and CT scan with some improvement in her recent fluid collection and peritonitis changes; no fever.  IV fluids were given, cultures taken, IV antibiotics started.  Due to ongoing low blood pressure and concern for underlying peritonitis trial hospital has been called to admit patient for further evaluation and management.  Past Medical/Surgical History: Past Medical History:  Diagnosis Date  . Anxiety   . Asthma    as a child  . Cancer ([place])    thyroid  . Cellulitis   . CHF (congestive heart failure) ([place])   . Chiari malformation    s/p surgery  . Chiari malformation   . Chronic pain   . Complication of anesthesia 11/28/15   Resp arrest  after  conscious  sedation  . Constipation   . Coronary artery disease    40-50% mid LAD 04/29/09, Medical tx. (Dr. Gwenlyn Found)  . Diabetes mellitus    Type II- reports being off all medication d/t it being controlled  . Fibromyalgia   . History of blood transfusion    hemorrage duinrg pregancy  . Hx of echocardiogram 10/2011   EF 55-60%  . Hypercholesterolemia   . Hypertension   . Lymph edema   . Obesity hypoventilation syndrome ([place])   . On home oxygen therapy    "2L; 24/7" (12/21/2018)  . Pneumonia    in past  . Pulmonary hypertension ([place])   . Renal disorder    M/W/F [facility] Pt started dialysis in Dec.2016  . S/P colonoscopy 05/26/2007   Dr. Laural Golden sigmoid diverticulosis random biopsies benign  . S/P endoscopy 05/01/2009   Dr. Penelope Coop pill-induced esophageal ulcerations distal to midesophagus, 2 small ulcers in the antrum of the stomach  . Shortness of breath dyspnea    with any exertion or if heart rate  is irregular while on dialysis  . Sleep apnea    reports that she no longer needs CPAP due to weight loss    Past Surgical History:  Procedure Laterality Date  . Marland KitchenHemodialysis catheter Right 11/28/2015  . A/V FISTULAGRAM N/A 07/26/2017   Procedure: A/V Fistulagram - Left Arm;  Surgeon: Serafina Mitchell, MD;  Location: [facility];  Service: Cardiovascular;  Laterality: N/A;  . ABDOMINAL HYSTERECTOMY    . ADENOIDECTOMY    . AV FISTULA PLACEMENT Left 03/15/2016   Procedure: CREATION OF LEFT ARM ARTERIOVENOUS (AV) FISTULA  ;  Surgeon: Rosetta Posner, MD;  Location: [facility];  Service: Vascular;  Laterality: Left;  . CAPD INSERTION N/A 08/30/2017   Procedure: LAPAROSCOPIC INSERTION CONTINUOUS AMBULATORY PERITONEAL DIALYSIS  (CAPD) CATHETER;  Surgeon: Clovis Riley, MD;  Location: [facility];  Service: General;  Laterality: N/A;  . CESAREAN SECTION      x 2  . CRANIECTOMY SUBOCCIPITAL W/ CERVICAL LAMINECTOMY / CHIARI    . FISTULA SUPERFICIALIZATION Left 05/10/2016    Procedure: Left Arm FISTULA SUPERFICIALIZATION;  Surgeon: Rosetta Posner, MD;  Location: [facility];  Service: Vascular;  Laterality: Left;  . IR GENERIC HISTORICAL  08/14/2016   IR REMOVAL TUN ACCESS W/ PORT W/O FL MOD SED 08/14/2016 Arne Cleveland, MD [facility]-INTERV RAD  . IR SINUS/FIST TUBE CHK-NON GI  01/01/2019  . IR US GUIDE BX ASP/DRAIN  12/28/2018  . PERIPHERAL VASCULAR CATHETERIZATION Left 11/25/2016   Procedure: A/V Fistulagram;  Surgeon: Conrad Eclectic, MD;  Location: [facility];  Service: Cardiovascular;  Laterality: Left;  arm  . PORTACATH PLACEMENT  07/05/2012   Procedure: INSERTION PORT-A-CATH;  Surgeon: Donato Heinz, MD;  Location: [facility];  Service: General;  Laterality: Left;  subclavian  . portacath removal    . RIGHT HEART CATH N/A 08/04/2017   Procedure: RIGHT HEART CATH;  Surgeon: Jolaine Artist, MD;  Location: [facility];  Service: Cardiovascular;  Laterality: N/A;  . THYROIDECTOMY, PARTIAL    . TONSILLECTOMY      Social History:  reports that she has never smoked. She has never used smokeless tobacco. She reports that she does not drink alcohol or use drugs.  Allergies: Allergies  Allergen Reactions  . Contrast Media [Iodinated Diagnostic Agents] Anaphylaxis, Hives, Swelling and Other (See Comments)    Dye for cardiac cath. Tongue swells  . Pneumococcal Vaccines Swelling and Other (See Comments)    Turns skin black, and bodily swelling  . Vancomycin Nausea And Vomiting and Other (See Comments)    Infusion "made me feel like I was dying" had to be readmitted to hospital    Family History:  Family History  Problem Relation Age of Onset  . Colon cancer Mother 61  . Stroke Mother 88  . Coronary artery disease Mother     Prior to Admission medications   Medication Sig Start Date End Date Taking? Authorizing Provider  acetaminophen (TYLENOL) 325 MG tablet Take 650 mg by mouth every 6 (six) hours as needed for moderate pain or fever.     [provider]  albuterol (PROVENTIL) (2.5 MG/3ML) 0.083% nebulizer solution Take 2.5 mg by nebulization every 6 (six) hours as needed for wheezing or shortness of breath.    [provider]  ALPRAZolam Duanne Moron) 0.5 MG tablet Take 0.5 mg by mouth 3 (three) times daily as needed for anxiety or sleep.     [provider]  amLODipine (NORVASC) 10 MG tablet Take 0.5 tablets (5 mg total) by mouth daily. 06/11/16   [name], MD  blood glucose meter kit and supplies KIT Dispense based on patient and insurance preference. Use up to four times daily as directed. (FOR ICD-9 250.00, 250.01). 06/30/18   Oswald Hillock, MD  calcitRIOL (ROCALTROL) 0.5 MCG capsule Take 0.5 mcg by mouth daily. 10/19/18   [provider]  diphenhydrAMINE (BENADRYL) 25 mg capsule Take 25 mg by mouth every 6 (six) hours as needed for allergies.    [provider]  insulin detemir (LEVEMIR) 100 UNIT/ML injection  Inject 10 Units into the skin daily.    [provider]  lanthanum (FOSRENOL) 500 MG chewable tablet Chew 1,000-1,500 mg by mouth 5 (five) times daily. Patient takes 3 tablets by mouth three times a day with meals and 2 tablets by mouth twice a day with snacks    [provider]  multivitamin (RENA-VIT) TABS tablet Take 1 tablet by mouth daily. 05/30/18   [provider]  Oxycodone HCl 10 MG TABS Take 1 tablet (10 mg total) by mouth 4 (four) times daily as needed (pain). 02/01/17   [name], MD  potassium chloride 20 MEQ TBCR Take 40 mEq by mouth daily. 06/30/18   Oswald Hillock, MD  senna (SENOKOT) 8.6 MG tablet Take 1 tablet by mouth daily.    [provider]  insulin lispro (HUMALOG) 100 UNIT/ML injection Inject 60 Units into the skin 2 (two) times daily.    03/08/12  [provider]    Review of Systems:  Negative except as otherwise mentioned in HPI.  Physical Exam: Vitals:   04/20/19 0820 04/20/19 0820 04/20/19 0900 04/20/19 0931  BP: (!) 88/51  (!) 80/50 (!) 81/45 (!) 89/73  Pulse: 82  77 (!) 106  Resp:   15 15  Temp:      TempSrc:      SpO2: 94%  95% 98%  Weight:      Height:       Constitutional: NAD, calm, comfortable; no chest pain, no shortness of breath.  Patient reports feeling slightly nauseated and having some discomfort in her abdomen.  Morbidly obese on exam. Eyes: PERRL, lids and conjunctivae normal, no icterus, no nystagmus. ENMT: Mucous membranes are slightly dry on examination. Posterior pharynx clear of any exudate or lesions. No Thrush.  Neck: normal, supple, no masses, no thyromegaly, unable to properly assess JVD due to body habitus. Respiratory: clear to auscultation bilaterally, no wheezing, no crackles. Normal respiratory effort. No accessory muscle use.  Wearing 2 L nasal cannula supplementation. Cardiovascular: Regular rate and rhythm, no murmurs / rubs / gallops. 2+ pedal pulses. No carotid bruits.  Abdomen: Mild tenderness to palpation (mainly left lower quadrant), no masses palpated.  Peritoneal catheter in place.  No guarding; positive bowel sounds. Musculoskeletal: no clubbing / cyanosis. No joint deformity upper and lower extremities. Good ROM, no contractures. Normal muscle tone.  Skin: no rashes, no petechiae.  Chronic stasis dermatitis/lymphedema appreciated on lower extremities bilaterally. Neurologic: CN 2-12 grossly intact. Sensation intact, DTR normal. Strength 5/5 in all 4.  Psychiatric: Normal judgment and insight. Alert and oriented x 3. Normal mood.    Labs on Admission: I have personally reviewed the following labs and imaging studies  CBC: Recent Labs  Lab 04/20/19 0229  WBC 14.8*  NEUTROABS 12.7*  HGB 11.0*  HCT 34.6*  MCV 99.7  PLT 638   Basic Metabolic Panel: Recent Labs  Lab 04/20/19 0229  NA 131*  K 3.0*  CL 90*  CO2 25  GLUCOSE 177*  BUN 41*  CREATININE 9.49*  CALCIUM 8.1*  MG 1.5*   GFR: Estimated Creatinine Clearance: 9.4 mL/min (A) (by C-G formula based on  SCr of 9.49 mg/dL (H)).   Liver Function Tests: Recent Labs  Lab 04/20/19 0229  AST 14*  ALT 14  ALKPHOS 136*  BILITOT 0.6  PROT 6.5  ALBUMIN 2.3*   Recent Labs  Lab 04/20/19 0229  LIPASE 24   Urine analysis:    Component Value Date/Time   COLORURINE YELLOW  08/11/2016 1800   APPEARANCEUR HAZY (A) 08/11/2016 1800   LABSPEC 1.020 08/11/2016 1800   PHURINE 5.5 08/11/2016 1800   GLUCOSEU NEGATIVE 08/11/2016 1800   HGBUR MODERATE (A) 08/11/2016 1800   BILIRUBINUR NEGATIVE 08/11/2016 1800   KETONESUR NEGATIVE 08/11/2016 1800   PROTEINUR 100 (A) 08/11/2016 1800   UROBILINOGEN 0.2 12/02/2014 1925   NITRITE NEGATIVE 08/11/2016 1800   LEUKOCYTESUR NEGATIVE 08/11/2016 1800    Recent Results (from the past 240 hour(s))  Gram stain     Status: None   Collection Time: 04/20/19  2:30 AM  Result Value Ref Range Status   Specimen Description PERITONEAL DIALYSATE  Final   Special Requests Normal  Final   Gram Stain   Final    CYTOSPIN SMEAR WBC PRESENT, PREDOMINANTLY PMN NO ORGANISMS SEEN RESULT CALLED TO, READ BACK BY AND VERIFIED WITH: J SAPPELT,RN _0  04/20/19 [facility] Performed at [facility], [address] [place], [place] [address]    Report Status 04/20/2019 FINAL  Final     Radiological Exams on Admission: Ct Abdomen Pelvis Wo Contrast  Result Date: 04/20/2019 CLINICAL DATA:  Initial evaluation for acute abdominal pain, shortness of breath while undergoing peritoneal dialysis. EXAM: CT ABDOMEN AND PELVIS WITHOUT CONTRAST TECHNIQUE: Multidetector CT imaging of the abdomen and pelvis was performed following the standard protocol without IV contrast. COMPARISON:  Prior CT from 12/27/2018. FINDINGS: Lower chest: Atelectatic changes seen dependently within the visualized lung bases. Visualized lungs are otherwise clear. Hepatobiliary: Liver demonstrates a normal unenhanced appearance. Gallbladder absent. No biliary dilatation. Pancreas: Pancreas within normal limits. Spleen:  Spleen within normal limits. Adrenals/Urinary Tract: Adrenal glands are normal. Kidneys equal in size without nephrolithiasis or hydronephrosis. No hydroureter. Bladder decompressed without abnormality. Stomach/Bowel: Stomach within normal limits. No evidence for bowel obstruction. No evidence for acute appendicitis. Mild colonic diverticulosis without evidence for acute diverticulitis. No acute inflammatory changes seen about the bowels. Vascular/Lymphatic: Intra-abdominal aorta of normal caliber. Mild aorto bi-iliac atherosclerotic disease. No adenopathy. Reproductive: Prior hysterectomy.  No adnexal mass. Other: Peritoneal dialysis catheter in place extending via the left lower quadrant, tip position within the left lower quadrant stable from previous. Persistent 2.2 cm collection at the entry site of the catheter, slightly decreased from previous (series 2, image 68). Persistent soft tissue stranding within the partially visualized adjacent abdominal wall extending into the pannus, suggesting persistent cellulitis/panniculitis, improved in appearance relative to previous exam. No soft tissue emphysema. Scattered free air with trace free fluid within the abdomen compatible with dialysis. Musculoskeletal: No acute osseous abnormality. No discrete lytic or blastic osseous lesions. IMPRESSION: 1. Persistent soft tissue stranding within the left anterior abdominal wall and pannus, suggesting possible cellulitis/panniculitis, improved in appearance relative to most recent CT from 12/27/2018. Persistent 2.2 cm collection at the peritoneal dialysis catheter in treat point, slightly decreased from previous as well. 2. No other acute intra-abdominal or pelvic process. 3. Free intraperitoneal air, compatible with peritoneal dialysis. 4. Mild colonic diverticulosis without evidence for acute diverticulitis. Electronically Signed   By: Jeannine Boga M.D.   On: 04/20/2019 04:32    EKG: Independently reviewed.   Voltage appreciated, no acute ischemic changes.  Normal axis; normal QT.  Assessment/Plan 1-abdominal pain: In a patient with history of peritoneal dialysis -Concern for peritonitis -CT abdomen demonstrating some fluid collection (but improve from prior studies) -Patient denies any fever -WBCs are elevated, blood pressure soft on presentation (even she is asymptomatic) -Normal heart rate. -Peritoneal fluid cultures taken -Patient started on IV Zosyn empirically -  IV fluids has been provided -She will be admitted to [facility] for close follow-up by nephrology and continuation of peritoneal dialysis. -Gentle fluid resuscitation will be given -Follow vital signs closely. -PRN analgesics and antiemetics will be provided.  2-Diabetes mellitus (HCC) -Check A1c -Continue low-dose Lantus nightly -Started on a sliding scale insulin  3-Morbid obesity (HCC) -Body mass index is 47.69 kg/m. -patient reported some weight loss recently -encourage to continue following low calorie diet, portion control and increase physical activity   4-HTN (hypertension) -on presentation BP is low -will hold hypertensive agents -Provide fluid resuscitation -Follow vital signs.  5-HLD (hyperlipidemia) -continue heat healthy diet -not using statins currently -repeat lipid panel as an outpatient   6-Obesity hypoventilation syndrome (HCC)/chronic respiratory failure -Chronically using 2 L of oxygen [place] -Reports no shortness of breath -Continue oxygen supplementation.    7-Chronic diastolic CHF (congestive heart failure) (HCC) -Stable and compensated currently -Continue volume control through dialysis -Education about low-sodium diet provided -Follow daily weights and strict I's and O's -Patient reported decreased oral intake for the last week and also vomiting; which increases GI losses and give concern for underlying dehydration. -Gentle fluids will be given over the next 12 hours to assist  with low blood pressure and proper hydration.  8-ESRD on dialysis [facility]) -Nephrology to be consulted once patient arrived to [facility] peritoneal dialysis -Continue calcitriol and Rena-Vite  9-secondary hyperparathyroidism -continue the use of Fosrenol  10-Hypokalemia -Magnesium level 1.5 -Patient received potassium and magnesium supplementation while in ED -We will follow electrolytes trend.  11-Anxiety -Continue as needed anxiolytics. -Mood stable currently.   DVT prophylaxis: Heparin Code Status: Full code Family Communication: No family at bedside Disposition Plan: Anticipate discharge back home once medically stable. Consults called: Case was discussed with nephrology; will need formal consultation once patient arrived to:[facility] Admission status: Inpatient, length of stay more than 2 midnights; telemetry bed.   Time Spent: 70 minutes  Barton Dubois MD [facility] Pager [phone]  04/20/2019, 9:41 AM

## 2019-04-20 NOTE — ED Notes (Addendum)
Pt vomited after taking potassium. Pt states her stomach has been intolerable of food on and off for the past week and a half.

## 2019-04-20 NOTE — Consult Note (Signed)
[name] Admit Date: 04/20/2019 04/20/2019 [name] Requesting Physician:  [place]  Reason for Consult:  Peritonitis, ESRD on PD, Abd Pain HPI:  52F ESRD on PD with Dr. Befekadu who was in her usual state of health until yesterday evening when she initiated PD.  Upon connection and PD fluid infusion she developed abdominal pain most notably in the left lower quadrant.  She performed a manual drain and the pain subsided somewhat when she presented to the emergency room.  Patient denies cloudy effluent, no recent fevers.  No abdominal pain prior to yesterday.  Of note she has been working with Dr. Befekadu for evaluation of anorexia and weight loss.  In the ER she was afebrile.  She had a leukocytosis of 14.8.  PD fluid sent for cell counts revealing 438 total nucleated cells, 58% monocytes, 16% PMNs, 9% eosinophils.  CT of the abdomen with persistent soft tissue stranding within the left anterior abdominal wall and pannus, suggesting possible cellulitis/panniculitis, improved in appearance relative to CT from 12/27/2018.  Further, she had a persistent 2.2 cm collection of the peritoneal dialysis catheter insert location which was slightly decreased in size.  No other acute intra-abdominal or pelvic processes noted.  Patient received cefepime and given hypotension was admitted.  PD fluid Gram stain without fungus or bacteria.  PD fluid culture revealing gram-positive cocci in the anaerobic bottle.  PD prescription is CCPD with six 3L exchanges approximately 1 hour and 45 minutes each with a 3 L last fill and a midday exchange of 2 L.  Patient denies any fat contamination or missteps and sterile technique.  Patient has a left forearm AV fistula, received hemodialysis for 2 years prior to transitioning to PD within the past year.  She has no previous history of peritonitis.     Creatinine, Ser (mg/dL)  Date Value  04/20/2019 9.27 (H)  04/20/2019 9.49 (H)  01/04/2019 11.45 (H)   01/03/2019 11.42 (H)  01/02/2019 11.80 (H)  12/28/2018 11.09 (H)  12/27/2018 10.97 (H)  12/26/2018 11.04 (H)  12/25/2018 10.65 (H)  12/24/2018 10.98 (H)  ] ROS:  Balance of 12 systems is negative w/ exceptions as above  PMH  Past Medical History:  Diagnosis Date  . Anxiety   . Asthma    as a child  . Cancer (HCC)    thyroid  . Cellulitis   . CHF (congestive heart failure) (HCC)   . Chiari malformation    s/p surgery  . Chiari malformation   . Chronic pain   . Complication of anesthesia 11/28/15   Resp arrest after  conscious  sedation  . Constipation   . Coronary artery disease    40-50% mid LAD 04/29/09, Medical tx. (Dr. Berry)  . Diabetes mellitus    Type II- reports being off all medication d/t it being controlled  . Fibromyalgia   . History of blood transfusion    hemorrage duinrg pregancy  . Hx of echocardiogram 10/2011   EF 55-60%  . Hypercholesterolemia   . Hypertension   . Lymph edema   . Obesity hypoventilation syndrome (HCC)   . On home oxygen therapy    "2L; 24/7" (12/21/2018)  . Pneumonia    in past  . Pulmonary hypertension (HCC)   . Renal disorder    M/W/F [facility] Pt started dialysis in Dec.2016  . S/P colonoscopy 05/26/2007   Dr. Rehman-> sigmoid diverticulosis random biopsies benign  . S/P endoscopy 05/01/2009   Dr. Ganem-> pill-induced esophageal ulcerations   distal to midesophagus, 2 small ulcers in the antrum of the stomach  . Shortness of breath dyspnea    with any exertion or if heart rate  is irregular while on dialysis  . Sleep apnea    reports that she no longer needs CPAP due to weight loss   PSH  Past Surgical History:  Procedure Laterality Date  . .Hemodialysis catheter Right 11/28/2015  . A/V FISTULAGRAM N/A 07/26/2017   Procedure: A/V Fistulagram - Left Arm;  Surgeon: Brabham, Vance W, MD;  [facility];  Service: Cardiovascular;  Laterality: N/A;  . ABDOMINAL HYSTERECTOMY    . ADENOIDECTOMY    . AV FISTULA  PLACEMENT Left 03/15/2016   Procedure: CREATION OF LEFT ARM ARTERIOVENOUS (AV) FISTULA  ;  Surgeon: Todd F Early, MD;  Location: [facility] OR;  Service: Vascular;  Laterality: Left;  . CAPD INSERTION N/A 08/30/2017   Procedure: LAPAROSCOPIC INSERTION CONTINUOUS AMBULATORY PERITONEAL DIALYSIS  (CAPD) CATHETER;  Surgeon: Connor, Chelsea A, MD;  Location: [facility] OR;  Service: General;  Laterality: N/A;  . CESAREAN SECTION      x 2  . CRANIECTOMY SUBOCCIPITAL W/ CERVICAL LAMINECTOMY / CHIARI    . FISTULA SUPERFICIALIZATION Left 05/10/2016   Procedure: Left Arm FISTULA SUPERFICIALIZATION;  Surgeon: Todd F Early, MD;  Location: [facility];  Service: Vascular;  Laterality: Left;  . IR GENERIC HISTORICAL  08/14/2016   IR REMOVAL TUN ACCESS W/ PORT W/O FL MOD SED 08/14/2016 Daniel Hassell, MD [facility]  . IR SINUS/FIST TUBE CHK-NON GI  01/01/2019  . IR US GUIDE BX ASP/DRAIN  12/28/2018  . PERIPHERAL VASCULAR CATHETERIZATION Left 11/25/2016   Procedure: A/V Fistulagram;  Surgeon: Brian L Chen, MD;  [facility];  Service: Cardiovascular;  Laterality: Left;  arm  . PORTACATH PLACEMENT  07/05/2012   Procedure: INSERTION PORT-A-CATH;  Surgeon: Brent C Ziegler, MD;  Location: [facility];  Service: General;  Laterality: Left;  subclavian  . portacath removal    . RIGHT HEART CATH N/A 08/04/2017   Procedure: RIGHT HEART CATH;  Surgeon: Bensimhon, Daniel R, MD;  [facility];  Service: Cardiovascular;  Laterality: N/A;  . THYROIDECTOMY, PARTIAL    . TONSILLECTOMY     FH  Family History  Problem Relation Age of Onset  . Colon cancer Mother 50  . Stroke Mother 52  . Coronary artery disease Mother    SH  reports that she has never smoked. She has never used smokeless tobacco. She reports that she does not drink alcohol or use drugs. Allergies  Allergies  Allergen Reactions  . Contrast Media [Iodinated Diagnostic Agents] Anaphylaxis, Hives, Swelling and Other (See Comments)    Dye for cardiac  cath. Tongue swells  . Pneumococcal Vaccines Swelling and Other (See Comments)    Turns skin black, and bodily swelling  . Vancomycin Nausea And Vomiting and Other (See Comments)    Infusion "made me feel like I was dying" had to be readmitted to hospital   Home medications Prior to Admission medications   Medication Sig Start Date End Date Taking? Authorizing Provider  acetaminophen (TYLENOL) 325 MG tablet Take 650 mg by mouth every 6 (six) hours as needed for moderate pain or fever.    Yes [provider]  albuterol (PROVENTIL) (2.5 MG/3ML) 0.083% nebulizer solution Take 2.5 mg by nebulization every 6 (six) hours as needed for wheezing or shortness of breath.   Yes [provider]  ALPRAZolam (XANAX) 0.5   MG tablet Take 0.5 mg by mouth 3 (three) times daily as needed for anxiety or sleep.    Yes [provider]  amLODipine (NORVASC) 10 MG tablet Take 0.5 tablets (5 mg total) by mouth daily. 06/11/16  Yes Hawkins, Edward, MD  blood glucose meter kit and supplies KIT Dispense based on patient and insurance preference. Use up to four times daily as directed. (FOR ICD-9 250.00, 250.01). 06/30/18  Yes Lama, Gagan S, MD  calcitRIOL (ROCALTROL) 0.5 MCG capsule Take 0.5 mcg by mouth daily. 10/19/18  Yes [provider]  diphenhydrAMINE (BENADRYL) 25 mg capsule Take 25 mg by mouth every 6 (six) hours as needed for allergies.   Yes [provider]  insulin detemir (LEVEMIR) 100 UNIT/ML injection Inject 10 Units into the skin daily.   Yes [provider]  lanthanum (FOSRENOL) 500 MG chewable tablet Chew 1,000-1,500 mg by mouth 5 (five) times daily. Patient takes 3 tablets by mouth three times a day with meals and 2 tablets by mouth twice a day with snacks   Yes [provider]  Oxycodone HCl 10 MG TABS Take 1 tablet (10 mg total) by mouth 4 (four) times daily as needed (pain). 02/01/17  Yes Hawkins, Edward, MD  potassium chloride 20 MEQ TBCR Take  40 mEq by mouth daily. 06/30/18  Yes Lama, Gagan S, MD  senna (SENOKOT) 8.6 MG tablet Take 1 tablet by mouth daily.   Yes [provider]  multivitamin (RENA-VIT) TABS tablet Take 1 tablet by mouth daily. 05/30/18   [provider]  insulin lispro (HUMALOG) 100 UNIT/ML injection Inject 60 Units into the skin 2 (two) times daily.    03/08/12  [provider]    Current Medications Scheduled Meds: . calcitRIOL  0.5 mcg Oral Daily  . gentamicin cream  1 application Topical Daily  . heparin  5,000 Units Subcutaneous Q8H  . insulin aspart  0-5 Units Subcutaneous QHS  . insulin aspart  0-9 Units Subcutaneous TID WC  . insulin detemir  5 Units Subcutaneous QHS  . lanthanum  1,000 mg Oral With snacks  . lanthanum  1,500 mg Oral TID WC  . multivitamin  1 tablet Oral QHS  . potassium chloride SA  40 mEq Oral Daily  . [START ON 04/21/2019] senna  1 tablet Oral Daily   Continuous Infusions: . sodium chloride 75 mL/hr at 04/20/19 0926  . dialysis solution 1.5% low-MG/low-CA    . dialysis solution 2.5% low-MG/low-CA    . piperacillin-tazobactam (ZOSYN)  IV 3.375 g (04/20/19 0927)  . vancomycin     PRN Meds:.acetaminophen, albuterol, ALPRAZolam, heparin, ondansetron **OR** ondansetron (ZOFRAN) IV  CBC Recent Labs  Lab 04/20/19 0229 04/20/19 1228  WBC 14.8* 13.2*  NEUTROABS 12.7*  --   HGB 11.0* 10.8*  HCT 34.6* 33.1*  MCV 99.7 97.6  PLT 316 288   Basic Metabolic Panel Recent Labs  Lab 04/20/19 0229 04/20/19 1228  NA 131*  --   K 3.0*  --   CL 90*  --   CO2 25  --   GLUCOSE 177*  --   BUN 41*  --   CREATININE 9.49* 9.27*  CALCIUM 8.1*  --   PHOS  --  5.7*    Physical Exam  Blood pressure (!) 84/54, pulse 76, temperature 97.6 F (36.4 C), temperature source Oral, resp. rate 16, height 5' 5" (1.651 m), weight 130 kg, SpO2 96 %. GEN: Obese in no distress, lying supine normal respiratory effort ENT:   NCAT EYES: EOMI CV: Regular, normal S1 and S2 PULM:  Clear bilaterally with normal work of breathing ABD: Large pannus, PD exit site in left upper quadrant, exit sites with normal-appearing sinus, no surrounding erythema or purulence expressed SKIN: As above EXT: No significant edema   Assessment 49F ESRD on PD, 57moanorexia, and acute onset abdominal pain after initiation of PD yesterday evening.  TNCs elevated but only 16% PMNs; PD Cx with GPC  1. ESRD on PD 2. PD Peritonitis, PD FLuid Cx GPC in cocci 3. 1 mo of anorexia and weight loss, ? Smoldering peritonitis 4. L RC AVF 5. Leukocytosis, probably #2 6. FLuid collection surrounding PD catheter, smaller compared to January, of unclera clinical significance 7. Hypokalemia, mild; on 459m KCl/d as outpt 8. CKD-BMD on C3, Lanthanum, C3   Plan 1. Vancomycin per pharmacy consult: She describes something like an infusion response previously, I do not think is a true allergy 2. CCPD overnight with 3 L fill, 6 exchanges, 3 L last fill 3. She is currently dry so I do not think that another cell count will be useful 4. Probably does not need Zosyn 5. Will follow questionable fluid collection  RyPearson GrippeD 04/20/2019, 4:41 PM

## 2019-04-20 NOTE — ED Provider Notes (Signed)
[facility] EMERGENCY DEPARTMENT Provider Note   CSN: [id number] Arrival date & time: 04/20/19  0025    History   Chief Complaint Chief Complaint  Patient presents with  . Abdominal Pain    HPI [name] is a 53 y.o. female.   The history is provided by the patient.  Abdominal Pain  She has history of diabetes, hypertension, hyperlipidemia, end-stage renal disease on peritoneal dialysis and comes in because of abdominal pain and vomiting.  She started her usual peritoneal dialysis this evening, but noted that her abdomen felt bloated as she was putting the dialysis fluid in.  She drained the fluid and it was clear.  She started another round of peritoneal dialysis and, when about half the fluid was then, she developed severe pain across her abdomen with associated vomiting.  She did drain that fluid.  She denies fever or chills.  She continues to have nausea.  Pain was initially rated a 10/10, has subsided to 4/10.  She has noted anorexia for the last month.  Past Medical History:  Diagnosis Date  . Anxiety   . Asthma    as a child  . Cancer ([place])    thyroid  . Cellulitis   . CHF (congestive heart failure) ([place])   . Chiari malformation    s/p surgery  . Chiari malformation   . Chronic pain   . Complication of anesthesia 11/28/15   Resp arrest after  conscious  sedation  . Constipation   . Coronary artery disease    40-50% mid LAD 04/29/09, Medical tx. (Dr. Gwenlyn Found)  . Diabetes mellitus    Type II- reports being off all medication d/t it being controlled  . Fibromyalgia   . History of blood transfusion    hemorrage duinrg pregancy  . Hx of echocardiogram 10/2011   EF 55-60%  . Hypercholesterolemia   . Hypertension   . Lymph edema   . Obesity hypoventilation syndrome ([place])   . On home oxygen therapy    "2L; 24/7" (12/21/2018)  . Pneumonia    in past  . Pulmonary hypertension ([place])   . Renal disorder    M/W/F [facility] Pt started dialysis in Dec.2016  .  S/P colonoscopy 05/26/2007   Dr. Laural Golden sigmoid diverticulosis random biopsies benign  . S/P endoscopy 05/01/2009   Dr. Penelope Coop pill-induced esophageal ulcerations distal to midesophagus, 2 small ulcers in the antrum of the stomach  . Shortness of breath dyspnea    with any exertion or if heart rate  is irregular while on dialysis  . Sleep apnea    reports that she no longer needs CPAP due to weight loss    Patient Active Problem List   Diagnosis Date Noted  . Peritonitis ([place]) 12/21/2018  . Abdominal pain, LLQ 12/21/2018  . Influenza B 12/21/2018  . SOB (shortness of breath) 06/27/2018  . Hypokalemia 06/27/2018  . Chronic renal failure   . Septic shock ([place]) 01/24/2017  . Shock liver 01/24/2017  . Cor pulmonale, chronic ([place]) 01/24/2017  . Elevated troponin   . Flu-like symptoms 01/18/2017  . Fever 01/18/2017  . HCAP (healthcare-associated pneumonia) 12/31/2016  . Numbness and tingling in right hand 08/13/2016  . Sepsis ([place]) 08/11/2016  . Coagulase negative Staphylococcus bacteremia 06/11/2016  . ESRD (end stage renal disease) ([place]) 06/06/2016  . Chronic cholecystitis 12/09/2015  . Protein-calorie malnutrition, severe 11/29/2015  . Respiratory arrest ([place]) 11/28/2015  . Recurrent cellulitis of lower leg 11/13/2015  . Chronic diastolic CHF (  congestive heart failure) ([place]) 11/13/2015  . Chronic respiratory failure with hypoxia ([place]) 11/13/2015  . ESRD on dialysis ([place]) 11/13/2015  . Anemia of chronic disease 11/13/2015  . Rotator cuff syndrome of right shoulder 05/30/2013  . Pain in joint, shoulder region 05/30/2013  . Obesity hypoventilation syndrome ([place]) 09/04/2012  . Leukopenia 11/11/2011  . HTN (hypertension) 11/11/2011  . HLD (hyperlipidemia) 11/11/2011  . Transaminitis 11/11/2011  . Diabetes mellitus ([place]) 10/27/2011  . Morbid obesity ([place]) 10/27/2011  . Lymphedema of lower extremity 10/04/2011    Past Surgical History:  Procedure Laterality Date  .  Marland KitchenHemodialysis catheter Right 11/28/2015  . A/V FISTULAGRAM N/A 07/26/2017   Procedure: A/V Fistulagram - Left Arm;  Surgeon: Serafina Mitchell, MD;  Location: [place] CV LAB;  Service: Cardiovascular;  Laterality: N/A;  . ABDOMINAL HYSTERECTOMY    . ADENOIDECTOMY    . AV FISTULA PLACEMENT Left 03/15/2016   Procedure: CREATION OF LEFT ARM ARTERIOVENOUS (AV) FISTULA  ;  Surgeon: Rosetta Posner, MD;  Location: [place];  Service: Vascular;  Laterality: Left;  . CAPD INSERTION N/A 08/30/2017   Procedure: LAPAROSCOPIC INSERTION CONTINUOUS AMBULATORY PERITONEAL DIALYSIS  (CAPD) CATHETER;  Surgeon: Clovis Riley, MD;  Location: [place];  Service: General;  Laterality: N/A;  . CESAREAN SECTION      x 2  . CRANIECTOMY SUBOCCIPITAL W/ CERVICAL LAMINECTOMY / CHIARI    . FISTULA SUPERFICIALIZATION Left 05/10/2016   Procedure: Left Arm FISTULA SUPERFICIALIZATION;  Surgeon: Rosetta Posner, MD;  Location: [facility] OR;  Service: Vascular;  Laterality: Left;  . IR GENERIC HISTORICAL  08/14/2016   IR REMOVAL TUN ACCESS W/ PORT W/O FL MOD SED 08/14/2016 Arne Cleveland, MD [facility]  . IR SINUS/FIST TUBE CHK-NON GI  01/01/2019  . IR US GUIDE BX ASP/DRAIN  12/28/2018  . PERIPHERAL VASCULAR CATHETERIZATION Left 11/25/2016   Procedure: A/V Fistulagram;  Surgeon: Conrad Kiowa, MD;  Location: [place] CV LAB;  Service: Cardiovascular;  Laterality: Left;  arm  . PORTACATH PLACEMENT  07/05/2012   Procedure: INSERTION PORT-A-CATH;  Surgeon: Donato Heinz, MD;  Location: [facility];  Service: General;  Laterality: Left;  subclavian  . portacath removal    . RIGHT HEART CATH N/A 08/04/2017   Procedure: RIGHT HEART CATH;  Surgeon: Jolaine Artist, MD;  Location: [place] CV LAB;  Service: Cardiovascular;  Laterality: N/A;  . THYROIDECTOMY, PARTIAL    . TONSILLECTOMY       OB History    Gravida  4   Para  3   Term  2   Preterm  1   AB  1   Living        SAB  1   TAB      Ectopic      Multiple      Live  Births               Home Medications    Prior to Admission medications   Medication Sig Start Date End Date Taking? Authorizing Provider  acetaminophen (TYLENOL) 325 MG tablet Take 650 mg by mouth every 6 (six) hours as needed for moderate pain or fever.     [provider]  albuterol (PROVENTIL) (2.5 MG/3ML) 0.083% nebulizer solution Take 2.5 mg by nebulization every 6 (six) hours as needed for wheezing or shortness of breath.    [provider]  ALPRAZolam Duanne Moron) 0.5 MG tablet Take 0.5 mg by mouth 3 (three) times daily as needed  for anxiety or sleep.     [provider]  amLODipine (NORVASC) 10 MG tablet Take 0.5 tablets (5 mg total) by mouth daily. 06/11/16   [name], MD  blood glucose meter kit and supplies KIT Dispense based on patient and insurance preference. Use up to four times daily as directed. (FOR ICD-9 250.00, 250.01). 06/30/18   Oswald Hillock, MD  calcitRIOL (ROCALTROL) 0.5 MCG capsule Take 0.5 mcg by mouth daily. 10/19/18   [provider]  diphenhydrAMINE (BENADRYL) 25 mg capsule Take 25 mg by mouth every 6 (six) hours as needed for allergies.    [provider]  insulin detemir (LEVEMIR) 100 UNIT/ML injection Inject 10 Units into the skin daily.    [provider]  lanthanum (FOSRENOL) 500 MG chewable tablet Chew 1,000-1,500 mg by mouth 5 (five) times daily. Patient takes 3 tablets by mouth three times a day with meals and 2 tablets by mouth twice a day with snacks    [provider]  multivitamin (RENA-VIT) TABS tablet Take 1 tablet by mouth daily. 05/30/18   [provider]  Oxycodone HCl 10 MG TABS Take 1 tablet (10 mg total) by mouth 4 (four) times daily as needed (pain). 02/01/17   [name], MD  potassium chloride 20 MEQ TBCR Take 40 mEq by mouth daily. 06/30/18   Oswald Hillock, MD  senna (SENOKOT) 8.6 MG tablet Take 1 tablet by mouth daily.    [provider]  insulin lispro  (HUMALOG) 100 UNIT/ML injection Inject 60 Units into the skin 2 (two) times daily.    03/08/12  [provider]    Family History Family History  Problem Relation Age of Onset  . Colon cancer Mother 16  . Stroke Mother 12  . Coronary artery disease Mother     Social History Social History   Tobacco Use  . Smoking status: Never Smoker  . Smokeless tobacco: Never Used  Substance Use Topics  . Alcohol use: No    Alcohol/week: 0.0 standard drinks  . Drug use: No     Allergies   Contrast media [iodinated diagnostic agents]; Pneumococcal vaccines; and Vancomycin   Review of Systems Review of Systems  Gastrointestinal: Positive for abdominal pain.  All other systems reviewed and are negative.    Physical Exam Updated Vital Signs BP (!) 94/57 (BP Location: Right Arm)   Pulse 83   Temp 97.7 F (36.5 C) (Oral)   Resp 19   Ht _0  (1.651 m)   Wt 130 kg   SpO2 99%   BMI 47.69 kg/m   Physical Exam Vitals signs and nursing note reviewed.    Morbidly obese 53 year old female, resting comfortably and in no acute distress. Vital signs are significant for mild hypotension. Oxygen saturation is 99%, which is normal. Head is normocephalic and atraumatic. PERRLA, EOMI. Oropharynx is clear. Neck is nontender and supple without adenopathy or JVD. Back is nontender and there is no CVA tenderness. Lungs are clear without rales, wheezes, or rhonchi. Chest is nontender. Heart has regular rate and rhythm without murmur. Abdomen has peritoneal dialysis catheter present in the left lower quadrant.  There is marked tenderness in throughout the left lower quadrant without rebound tenderness.  Remainder of abdomen is soft and nontender.  There are no masses or hepatosplenomegaly.  Peristalsis is hypoactive. Extremities have no cyanosis or edema, full range of motion is present.  AV fistula is present in the left forearm with thrill present.  Skin is warm and dry without rash.  Neurologic: Mental status is normal, cranial nerves are intact, there are no motor or sensory deficits.  ED Treatments / Results  Labs (all labs ordered are listed, but only abnormal results are displayed) Labs Reviewed - No data to display  EKG None  Radiology No results found.  Procedures Procedures (including critical care time)  Medications Ordered in ED Medications - No data to display   Initial Impression / Assessment and Plan / ED Course  I have reviewed the triage vital signs and the nursing notes.  Pertinent labs & imaging results that were available during my care of the patient were reviewed by me and considered in my medical decision making (see chart for details).  Abdominal pain in patient on peritoneal dialysis.  First concern would be for bacterial peritonitis.  Will obtain specimen of dialysate to send for cell count and culture.  We will also send for CT of abdomen and pelvis.  Dialysis nurse was not available, so with sterile technique, I drew 10 mL of dialysate out to send for analysis and culture.  Dialysate does have 468 WBCs, but only 15% PMNs.  WBC is mildly elevated at 14.8 and with a left shift of 87% neutrophils.  Mild anemia is present which is actually improved over baseline.  Metabolic panel significant for mild hyponatremia, hypokalemia, elevated BUN and creatinine, and mildly elevated alkaline phosphatase.  Separately, she is noted to have hypomagnesemia.  She is given IV magnesium.  She was given some oral potassium but vomited following that.  Elevated alkaline phosphatase is felt to be due to renal osteodystrophy, although it was normal in January of this year.  CT of abdomen and pelvis showed inflammatory changes in fluid collection in the region of the peritoneal dialysis catheter, but these were all somewhat improved over her CT in January of this year.  Case was discussed with Dr. Justin Mend, on-call for nephrology.  It was felt that the clinical  presentation and dialysis fluid were somewhat atypical for bacterial peritonitis.  However, pending culture results, she was given a dose of cefepime.  Patient states that she is constipated and has not had a bowel movement in over a week and she is given a dose of magnesium citrate.  Patient was advised of the plan which includes her contacting her dialysis nurse today so that they know to look for culture results.  Blood pressure has gotten little down into the 80s.  She is given IV fluids.  Case and is endorsed to Dr. Stark Jock to make sure she is safe for discharge.  If hypotension persists, she may need to be admitted.  Final Clinical Impressions(s) / ED Diagnoses   Final diagnoses:  Left lower quadrant abdominal pain  End-stage renal disease on peritoneal dialysis ([place])  Hypokalemia  Hypomagnesemia  Anemia associated with chronic renal failure    ED Discharge Orders    None       Delora Fuel, MD 83/41/96 [phone]

## 2019-04-20 NOTE — Progress Notes (Signed)
Pharmacy Antibiotic Note  [name] is a 53 y.o. female admitted on 04/20/2019 with abdominal pain and noted with a history of ESRD with peritoneal HD.   Pharmacy has been consulted for vancomycin dosing for GPC peritonitis.   Plan: -Vancomycin 2545m IV x1  -Check a vancomycin level in 3-7 days and redose when level < 20 -Will follow cultures and clinical progress   Height: _0  (165.1 cm) Weight: 286 lb 9.6 oz (130 kg) IBW/kg (Calculated) : 57  Temp (24hrs), Avg:97.6 F (36.4 C), Min:97.6 F (36.4 C), Max:97.7 F (36.5 C)  Recent Labs  Lab 04/20/19 0229 04/20/19 1228  WBC 14.8* 13.2*  CREATININE 9.49* 9.27*    Estimated Creatinine Clearance: 9.7 mL/min (A) (by C-G formula based on SCr of 9.27 mg/dL (H)).    Allergies  Allergen Reactions  . Contrast Media [Iodinated Diagnostic Agents] Anaphylaxis, Hives, Swelling and Other (See Comments)    Dye for cardiac cath. Tongue swells  . Pneumococcal Vaccines Swelling and Other (See Comments)    Turns skin black, and bodily swelling  . Vancomycin Nausea And Vomiting and Other (See Comments)    Infusion "made me feel like I was dying" had to be readmitted to hospital    Antimicrobials this admission: 5/1 vanc>>  Dose adjustments this admission: 5/1 vancomycin 25038m Microbiology results: 5/1- peritoneal dialysate  Thank you for allowing pharmacy to be a part of this patient's care.  AnHildred LaserPharmD Clinical Pharmacist **Pharmacist phone directory can now be found on [email or web address] ([email or web address]).  Listed under [facility]

## 2019-04-20 NOTE — ED Notes (Signed)
ED TO INPATIENT HANDOFF REPORT  ED Nurse Name and Phone #: [phone]  S Name/Age/Gender [name] 53 y.o. female Room/Bed: APA17/APA17  Code Status   Code Status: Prior  Home/SNF/Other Home Patient oriented to: self Is this baseline? Yes   Triage Complete: Triage complete  Chief Complaint abd pain  Triage Note Pt came in by EMS complaint of abdominal pain and shortness of breath that started tonight while doing her peritoneal dialysis. Pt states the pain started with first fill up and second fill up patient could not continue because of the pain. Pt states she did a manual drain of the fluids. Pt states her potassium was 2 a week ago. Pt states she has been taking her potassium pills but has not have an appetite for a month. Shortness of breath started right before EMS arrival. Pt states she does not feel short of breath at this moment. Hx of COPD   Allergies Allergies  Allergen Reactions  . Contrast Media [Iodinated Diagnostic Agents] Anaphylaxis, Hives, Swelling and Other (See Comments)    Dye for cardiac cath. Tongue swells  . Pneumococcal Vaccines Swelling and Other (See Comments)    Turns skin black, and bodily swelling  . Vancomycin Nausea And Vomiting and Other (See Comments)    Infusion "made me feel like I was dying" had to be readmitted to hospital    Level of Care/Admitting Diagnosis ED Disposition    ED Disposition Condition [place]: [place] [100100]  Level of Care: Progressive [102]  Covid Evaluation: N/A  Diagnosis: Abdominal pain [[id number]]  Admitting Physician: Barton Dubois [[id number]]  Attending Physician: Barton Dubois [[id number]]  Estimated length of stay: past midnight tomorrow  Certification:: I certify this patient will need inpatient services for at least 2 midnights  PT Class (Do Not Modify): Inpatient [101]  PT Acc Code (Do Not Modify): Private [1]       B Medical/Surgery History Past Medical  History:  Diagnosis Date  . Anxiety   . Asthma    as a child  . Cancer ([place])    thyroid  . Cellulitis   . CHF (congestive heart failure) ([place])   . Chiari malformation    s/p surgery  . Chiari malformation   . Chronic pain   . Complication of anesthesia 11/28/15   Resp arrest after  conscious  sedation  . Constipation   . Coronary artery disease    40-50% mid LAD 04/29/09, Medical tx. (Dr. Gwenlyn Found)  . Diabetes mellitus    Type II- reports being off all medication d/t it being controlled  . Fibromyalgia   . History of blood transfusion    hemorrage duinrg pregancy  . Hx of echocardiogram 10/2011   EF 55-60%  . Hypercholesterolemia   . Hypertension   . Lymph edema   . Obesity hypoventilation syndrome ([place])   . On home oxygen therapy    "2L; 24/7" (12/21/2018)  . Pneumonia    in past  . Pulmonary hypertension ([place])   . Renal disorder    M/W/F [facility] Pt started dialysis in Dec.2016  . S/P colonoscopy 05/26/2007   Dr. Laural Golden sigmoid diverticulosis random biopsies benign  . S/P endoscopy 05/01/2009   Dr. Penelope Coop pill-induced esophageal ulcerations distal to midesophagus, 2 small ulcers in the antrum of the stomach  . Shortness of breath dyspnea    with any exertion or if heart rate  is irregular while on dialysis  . Sleep apnea  reports that she no longer needs CPAP due to weight loss   Past Surgical History:  Procedure Laterality Date  . Marland KitchenHemodialysis catheter Right 11/28/2015  . A/V FISTULAGRAM N/A 07/26/2017   Procedure: A/V Fistulagram - Left Arm;  Surgeon: Serafina Mitchell, MD;  Location: [facility];  Service: Cardiovascular;  Laterality: N/A;  . ABDOMINAL HYSTERECTOMY    . ADENOIDECTOMY    . AV FISTULA PLACEMENT Left 03/15/2016   Procedure: CREATION OF LEFT ARM ARTERIOVENOUS (AV) FISTULA  ;  Surgeon: Rosetta Posner, MD;  Location: [facility];  Service: Vascular;  Laterality: Left;  . CAPD INSERTION N/A 08/30/2017   Procedure: LAPAROSCOPIC INSERTION CONTINUOUS  AMBULATORY PERITONEAL DIALYSIS  (CAPD) CATHETER;  Surgeon: Clovis Riley, MD;  Location: [facility];  Service: General;  Laterality: N/A;  . CESAREAN SECTION      x 2  . CRANIECTOMY SUBOCCIPITAL W/ CERVICAL LAMINECTOMY / CHIARI    . FISTULA SUPERFICIALIZATION Left 05/10/2016   Procedure: Left Arm FISTULA SUPERFICIALIZATION;  Surgeon: Rosetta Posner, MD;  Location: [facility] OR;  Service: Vascular;  Laterality: Left;  . IR GENERIC HISTORICAL  08/14/2016   IR REMOVAL TUN ACCESS W/ PORT W/O FL MOD SED 08/14/2016 Arne Cleveland, MD [facility]  . IR SINUS/FIST TUBE CHK-NON GI  01/01/2019  . IR US GUIDE BX ASP/DRAIN  12/28/2018  . PERIPHERAL VASCULAR CATHETERIZATION Left 11/25/2016   Procedure: A/V Fistulagram;  Surgeon: Conrad Huntsville, MD;  Location: [facility];  Service: Cardiovascular;  Laterality: Left;  arm  . PORTACATH PLACEMENT  07/05/2012   Procedure: INSERTION PORT-A-CATH;  Surgeon: Donato Heinz, MD;  Location: [facility];  Service: General;  Laterality: Left;  subclavian  . portacath removal    . RIGHT HEART CATH N/A 08/04/2017   Procedure: RIGHT HEART CATH;  Surgeon: Jolaine Artist, MD;  Location: [facility];  Service: Cardiovascular;  Laterality: N/A;  . THYROIDECTOMY, PARTIAL    . TONSILLECTOMY       A IV Location/Drains/Wounds Patient Lines/Drains/Airways Status   Active Line/Drains/Airways    Name:   Placement date:   Placement time:   Site:   Days:   Peripheral IV 04/20/19 Right Forearm   04/20/19    0047    Forearm   less than 1   Fistula / Graft Left  Arteriovenous fistula   03/15/16    1145    -   1131   Fistula / Graft Left Forearm Arteriovenous fistula   -    -    Forearm      Fistula / Graft Left Forearm Arteriovenous fistula   -    -    Forearm      Fistula / Graft Left Forearm Arteriovenous fistula   -    -    Forearm      Incision (Closed) 08/30/17 Abdomen Other (Comment)   08/30/17    0819     598          Intake/Output Last 24 hours  Intake/Output Summary  (Last 24 hours) at 04/20/2019 1008 Last data filed at 04/20/2019 4696 Gross per 24 hour  Intake 1138.17 ml  Output -  Net 1138.17 ml    Labs/Imaging Results for orders placed or performed during the hospital encounter of 04/20/19 (from the past 48 hour(s))  Comprehensive metabolic panel     Status: Abnormal   Collection Time: 04/20/19  2:29 AM  Result Value Ref Range   Sodium  131 (L) 135 - 145 mmol/L   Potassium 3.0 (L) 3.5 - 5.1 mmol/L   Chloride 90 (L) 98 - 111 mmol/L   CO2 25 22 - 32 mmol/L   Glucose, Bld 177 (H) 70 - 99 mg/dL   BUN 41 (H) 6 - 20 mg/dL   Creatinine, Ser 9.49 (H) 0.44 - 1.00 mg/dL   Calcium 8.1 (L) 8.9 - 10.3 mg/dL   Total Protein 6.5 6.5 - 8.1 g/dL   Albumin 2.3 (L) 3.5 - 5.0 g/dL   AST 14 (L) 15 - 41 U/L   ALT 14 0 - 44 U/L   Alkaline Phosphatase 136 (H) 38 - 126 U/L   Total Bilirubin 0.6 0.3 - 1.2 mg/dL   GFR calc non Af Amer 4 (L) >60 mL/min   GFR calc Af Amer 5 (L) >60 mL/min   Anion gap 16 (H) 5 - 15    Comment: Performed at [facility], [address], [place], [place] [address]  CBC with Differential     Status: Abnormal   Collection Time: 04/20/19  2:29 AM  Result Value Ref Range   WBC 14.8 (H) 4.0 - 10.5 K/uL   RBC 3.47 (L) 3.87 - 5.11 MIL/uL   Hemoglobin 11.0 (L) 12.0 - 15.0 g/dL   HCT 34.6 (L) 36.0 - 46.0 %   MCV 99.7 80.0 - 100.0 fL   MCH 31.7 26.0 - 34.0 pg   MCHC 31.8 30.0 - 36.0 g/dL   RDW 14.4 11.5 - 15.5 %   Platelets 316 150 - 400 K/uL   nRBC 0.0 0.0 - 0.2 %   Neutrophils Relative % 87 %   Neutro Abs 12.7 (H) 1.7 - 7.7 K/uL   Lymphocytes Relative 7 %   Lymphs Abs 1.1 0.7 - 4.0 K/uL   Monocytes Relative 5 %   Monocytes Absolute 0.7 0.1 - 1.0 K/uL   Eosinophils Relative 0 %   Eosinophils Absolute 0.1 0.0 - 0.5 K/uL   Basophils Relative 0 %   Basophils Absolute 0.0 0.0 - 0.1 K/uL   Immature Granulocytes 1 %   Abs Immature Granulocytes 0.16 (H) 0.00 - 0.07 K/uL    Comment: Performed at [facility], [address], [place],  [place] [address]  Lipase, blood     Status: None   Collection Time: 04/20/19  2:29 AM  Result Value Ref Range   Lipase 24 11 - 51 U/L    Comment: Performed at [facility], [address] [place], [place] [address]  Magnesium     Status: Abnormal   Collection Time: 04/20/19  2:29 AM  Result Value Ref Range   Magnesium 1.5 (L) 1.7 - 2.4 mg/dL    Comment: Performed at [facility], [address] [place], [place] [address]  Body fluid cell count with differential     Status: None   Collection Time: 04/20/19  2:30 AM  Result Value Ref Range   Fluid Type-FCT Peritoneal    Color, Fluid YELLOW YELLOW   Appearance, Fluid CLEAR CLEAR   WBC, Fluid 438 0 - 1,000 cu mm   Neutrophil Count, Fluid 16 0 - 25 %   Lymphs, Fluid 17 %   Monocyte-Macrophage-Serous Fluid 58 50 - 90 %   Eos, Fluid 9 %    Comment: Performed at [facility], [address] [place], [place] [address]  Gram stain     Status: None   Collection Time: 04/20/19  2:30 AM  Result Value Ref Range  Specimen Description PERITONEAL DIALYSATE    Special Requests Normal    Gram Stain      CYTOSPIN SMEAR WBC PRESENT, PREDOMINANTLY PMN NO ORGANISMS SEEN RESULT CALLED TO, READ BACK BY AND VERIFIED WITH: J SAPPELT,RN _0  04/20/19 [facility] Performed at [facility], [address] [place], [place] [address]    Report Status 04/20/2019 FINAL    Ct Abdomen Pelvis Wo Contrast  Result Date: 04/20/2019 CLINICAL DATA:  Initial evaluation for acute abdominal pain, shortness of breath while undergoing peritoneal dialysis. EXAM: CT ABDOMEN AND PELVIS WITHOUT CONTRAST TECHNIQUE: Multidetector CT imaging of the abdomen and pelvis was performed following the standard protocol without IV contrast. COMPARISON:  Prior CT from 12/27/2018. FINDINGS: Lower chest: Atelectatic changes seen dependently within the visualized lung bases. Visualized lungs are otherwise clear. Hepatobiliary: Liver demonstrates a normal unenhanced appearance. Gallbladder absent. No  biliary dilatation. Pancreas: Pancreas within normal limits. Spleen: Spleen within normal limits. Adrenals/Urinary Tract: Adrenal glands are normal. Kidneys equal in size without nephrolithiasis or hydronephrosis. No hydroureter. Bladder decompressed without abnormality. Stomach/Bowel: Stomach within normal limits. No evidence for bowel obstruction. No evidence for acute appendicitis. Mild colonic diverticulosis without evidence for acute diverticulitis. No acute inflammatory changes seen about the bowels. Vascular/Lymphatic: Intra-abdominal aorta of normal caliber. Mild aorto bi-iliac atherosclerotic disease. No adenopathy. Reproductive: Prior hysterectomy.  No adnexal mass. Other: Peritoneal dialysis catheter in place extending via the left lower quadrant, tip position within the left lower quadrant stable from previous. Persistent 2.2 cm collection at the entry site of the catheter, slightly decreased from previous (series 2, image 68). Persistent soft tissue stranding within the partially visualized adjacent abdominal wall extending into the pannus, suggesting persistent cellulitis/panniculitis, improved in appearance relative to previous exam. No soft tissue emphysema. Scattered free air with trace free fluid within the abdomen compatible with dialysis. Musculoskeletal: No acute osseous abnormality. No discrete lytic or blastic osseous lesions. IMPRESSION: 1. Persistent soft tissue stranding within the left anterior abdominal wall and pannus, suggesting possible cellulitis/panniculitis, improved in appearance relative to most recent CT from 12/27/2018. Persistent 2.2 cm collection at the peritoneal dialysis catheter in treat point, slightly decreased from previous as well. 2. No other acute intra-abdominal or pelvic process. 3. Free intraperitoneal air, compatible with peritoneal dialysis. 4. Mild colonic diverticulosis without evidence for acute diverticulitis. Electronically Signed   By: Jeannine Boga  M.D.   On: 04/20/2019 04:32    Pending Labs Unresulted Labs (From admission, onward)    Start     Ordered   04/20/19 0525  Pathologist smear review  Once,   R     04/20/19 0525   04/20/19 0230  Culture, body fluid-bottle  Once,   R     04/20/19 0230   Signed and Held  CBC  (heparin)  Once,   R    Comments:  Baseline for heparin therapy IF NOT ALREADY DRAWN.  Notify MD if PLT < 100 K.    Signed and Held   Signed and Held  Creatinine, serum  (heparin)  Once,   R    Comments:  Baseline for heparin therapy IF NOT ALREADY DRAWN.    Signed and Held   Signed and Held  Magnesium  Once,   R     Signed and Held   Signed and Held  Phosphorus  Once,   R     Signed and Held   Signed and Held  Hemoglobin A1c  Once,   R     Signed and  Held   Signed and Held  Basic metabolic panel  Tomorrow morning,   R     Signed and Held   Signed and Held  CBC  Tomorrow morning,   R     Signed and Held          Vitals/Pain Today's Vitals   04/20/19 0820 04/20/19 0820 04/20/19 0900 04/20/19 0931  BP: (!) 88/51 (!) 80/50 (!) 81/45 (!) 89/73  Pulse: 82  77 (!) 106  Resp:   15 15  Temp:      TempSrc:      SpO2: 94%  95% 98%  Weight:      Height:      PainSc:        Isolation Precautions No active isolations  Medications Medications  0.9 %  sodium chloride infusion ( Intravenous New Bag/Given 04/20/19 0926)  piperacillin-tazobactam (ZOSYN) IVPB 3.375 g (3.375 g Intravenous New Bag/Given 04/20/19 0927)  morphine 4 MG/ML injection 4 mg (4 mg Intravenous Given 04/20/19 0207)  ondansetron (ZOFRAN) injection 4 mg (4 mg Intravenous Given 04/20/19 0207)  potassium chloride SA (K-DUR) CR tablet 40 mEq (40 mEq Oral Given 04/20/19 0425)  magnesium sulfate IVPB 2 g 50 mL (0 g Intravenous Stopped 04/20/19 0533)  ceFEPIme (MAXIPIME) 2 g in sodium chloride 0.9 % 100 mL IVPB (0 g Intravenous Stopped 04/20/19 0609)  prochlorperazine (COMPAZINE) injection 10 mg (10 mg Intravenous Given 04/20/19 0614)  magnesium citrate  solution 1 Bottle (1 Bottle Oral Given 04/20/19 0614)  sodium chloride 0.9 % bolus 500 mL (0 mLs Intravenous Stopped 04/20/19 0800)  sodium chloride 0.9 % bolus 500 mL (0 mLs Intravenous Stopped 04/20/19 0926)    Mobility walks with device Moderate fall risk   Focused Assessments    R Recommendations: See Admitting Provider Note  Report given to:   Additional Notes:

## 2019-04-20 NOTE — ED Triage Notes (Signed)
Pt came in by EMS complaint of abdominal pain and shortness of breath that started tonight while doing her peritoneal dialysis. Pt states the pain started with first fill up and second fill up patient could not continue because of the pain. Pt states she did a manual drain of the fluids. Pt states her potassium was 2 a week ago. Pt states she has been taking her potassium pills but has not have an appetite for a month. Shortness of breath started right before EMS arrival. Pt states she does not feel short of breath at this moment. Hx of COPD

## 2019-04-20 NOTE — ED Notes (Signed)
Dr. Roxanne Mins notified of bp.

## 2019-04-20 NOTE — ED Provider Notes (Signed)
Care assumed from Dr. Roxanne Mins at shift change.  Patient presents here with abdominal pain that occurred yesterday while undergoing peritoneal dialysis at home.  Patient has a slight leukocytosis and the concern for bacterial peritonitis has been raised.  She does have 438 white cells in the fluid, but culture remains pending.  Patient presumptively given 2 g of IV cefepime.  Patient appears clinically well, however is hypotensive with a blood pressure of 80s over 50s.  Patient is asymptomatic with this while lying in bed.  She was given 2 500 cc boluses of saline, however blood pressure has not improved.  Due to the hypotension, I do not feel as though this patient can be safely discharged.  I have discussed her care with Dr. Dyann Kief from the hospitalist service who will evaluate.   Veryl Speak, MD 04/20/19 [phone]

## 2019-04-21 LAB — BASIC METABOLIC PANEL
Anion gap: 15 (ref 5–15)
BUN: 42 mg/dL — ABNORMAL HIGH (ref 6–20)
CO2: 23 mmol/L (ref 22–32)
Calcium: 8.5 mg/dL — ABNORMAL LOW (ref 8.9–10.3)
Chloride: 97 mmol/L — ABNORMAL LOW (ref 98–111)
Creatinine, Ser: 9.67 mg/dL — ABNORMAL HIGH (ref 0.44–1.00)
GFR calc Af Amer: 5 mL/min — ABNORMAL LOW (ref >60)
GFR calc non Af Amer: 4 mL/min — ABNORMAL LOW (ref >60)
Glucose, Bld: 143 mg/dL — ABNORMAL HIGH (ref 70–99)
Potassium: 4 mmol/L (ref 3.5–5.1)
Sodium: 135 mmol/L (ref 135–145)

## 2019-04-21 LAB — GRAM STAIN

## 2019-04-21 LAB — CBC
HCT: 32.5 % — ABNORMAL LOW (ref 36.0–46.0)
Hemoglobin: 10.5 g/dL — ABNORMAL LOW (ref 12.0–15.0)
MCH: 31.7 pg (ref 26.0–34.0)
MCHC: 32.3 g/dL (ref 30.0–36.0)
MCV: 98.2 fL (ref 80.0–100.0)
Platelets: 280 10*3/uL (ref 150–400)
RBC: 3.31 MIL/uL — ABNORMAL LOW (ref 3.87–5.11)
RDW: 14.1 % (ref 11.5–15.5)
WBC: 11.5 10*3/uL — ABNORMAL HIGH (ref 4.0–10.5)
nRBC: 0 % (ref 0.0–0.2)

## 2019-04-21 LAB — BODY FLUID CELL COUNT WITH DIFFERENTIAL
Eos, Fluid: 4 %
Lymphs, Fluid: 6 %
Monocyte-Macrophage-Serous Fluid: 7 % — ABNORMAL LOW (ref 50–90)
Neutrophil Count, Fluid: 83 % — ABNORMAL HIGH (ref 0–25)
Total Nucleated Cell Count, Fluid: 20 cu mm (ref 0–1000)

## 2019-04-21 LAB — GLUCOSE, CAPILLARY
Glucose-Capillary: 86 mg/dL (ref 70–99)
Glucose-Capillary: 99 mg/dL (ref 70–99)
Glucose-Capillary: 89 mg/dL (ref 70–99)
Glucose-Capillary: 91 mg/dL (ref 70–99)

## 2019-04-21 MED ORDER — DOCUSATE SODIUM 100 MG PO CAPS
100.0000 mg | ORAL_CAPSULE | Freq: Two times a day (BID) | ORAL | Status: DC
  Administered 2019-04-21 – 2019-04-30 (×15): 100 mg via ORAL
  Filled 2019-04-21 (×18): qty 1

## 2019-04-21 MED ORDER — SODIUM CHLORIDE 0.9 % IV BOLUS
500.0000 mL | Freq: Once | INTRAVENOUS | Status: AC
  Administered 2019-04-21: 500 mL via INTRAVENOUS

## 2019-04-21 MED ORDER — LACTULOSE 10 GM/15ML PO SOLN
30.0000 g | Freq: Three times a day (TID) | ORAL | Status: DC
  Administered 2019-04-21 – 2019-04-22 (×3): 30 g via ORAL
  Filled 2019-04-21 (×4): qty 45

## 2019-04-21 MED ORDER — MAGNESIUM SULFATE IN D5W 1-5 GM/100ML-% IV SOLN
1.0000 g | Freq: Once | INTRAVENOUS | Status: AC
  Administered 2019-04-21: 1 g via INTRAVENOUS
  Filled 2019-04-21 (×2): qty 100

## 2019-04-21 MED ORDER — PIPERACILLIN-TAZOBACTAM IN DEX 2-0.25 GM/50ML IV SOLN
2.2500 g | Freq: Two times a day (BID) | INTRAVENOUS | Status: DC
  Administered 2019-04-21: 2.25 g via INTRAVENOUS
  Filled 2019-04-21 (×2): qty 50

## 2019-04-21 NOTE — Progress Notes (Signed)
PROGRESS NOTE    [name]  BZJ:[id number] DOB: 02/20/1966 DOA: 04/20/2019 PCP: [name], MD   Brief Narrative:  Per HPI: [name] is a 53 y.o. female with a past medical history significant for anxiety, chronic diastolic heart failure, history of fibromyalgia, type 2 diabetes mellitus chronically on insulin with nephropathy, chronic respiratory failure, morbid obesity with hypoventilation syndrome and end-stage renal disease on peritoneal dialysis; who presented to the emergency department secondary to acute development of abdominal pain (left lower quadrant), 9 out of 10 in intensity when first presented with subsequent improvement to 4-5 out of 10; associated nausea/vomiting (no blood appreciated in vomit content), nothing makes condition better or worse.  Patient reports partially being able to initiate her nocturnal peritoneal dialysis treatment.  Patient also expressed some ongoing constipation and decrease in her appetite. She denies any fever, chills, cough, chest pain, shortness of breath, melena, hematochezia, focal weakness, sick contacts or any other complaints.  In the ED patient was found with a low blood pressure, elevated WBCs and CT scan with some improvement in her recent fluid collection and peritonitis changes; no fever.  IV fluids were given, cultures taken, IV antibiotics started.  Due to ongoing low blood pressure and concern for underlying peritonitis trial hospital has been called to admit patient for further evaluation and management.   Assessment & Plan:   Principal Problem:   Abdominal pain Active Problems:   Diabetes mellitus (HCC)   Morbid obesity (HCC)   HTN (hypertension)   HLD (hyperlipidemia)   Obesity hypoventilation syndrome (HCC)   Chronic diastolic CHF (congestive heart failure) (HCC)   ESRD on dialysis ([place])   Hypokalemia   Peritonitis ([place])   1-abdominal pain/Peritonitis: In a patient with history of peritoneal  dialysis -Concern for peritonitis as above,  -CT abdomen demonstrating some fluid collection (but improve from prior studies) -Patient denies any fever -WBCs are elevated, blood pressure soft on presentation (even she is asymptomatic) -Normal heart rate. -Peritoneal fluid cultures taken - neg at 12 hours, continue to follow -Patient started on IV Zosyn empirically,i will continue -IV fluids has been provided, monitor closely for volume -She was admitted for close follow-up by nephrology and continuation of peritoneal dialysis vs HD if unable to tolerate -Gentle fluid resuscitation will be given -Follow vital signs closely. -PRN analgesics and antiemetics will be provided.  2-Diabetes mellitus (HCC) -7.1 A1c -Continue low-dose Lantus nightly -cont sliding scale insulin  3-Morbid obesity (HCC) -Body mass index is 47.69 kg/m. -patient reported some weight loss recently -encourage to continue following low calorie diet, portion control and increase physical activity   4-HTN (hypertension) -on presentation BP is low -will hold hypertensive agents -Provide fluid resuscitation -Follow vital signs.  5-HLD (hyperlipidemia) -continue heat healthy diet -not using statins currently -repeat lipid panel as an outpatient   6-Obesity hypoventilation syndrome (HCC)/chronic respiratory failure -Chronically using 2 L of oxygen [place] -Reports no shortness of breath -Continue oxygen supplementation.    7-Chronic diastolic CHF (congestive heart failure) (HCC) -remains stable and compensated currently -Continue volume control through dialysis -Education about low-sodium diet provided and reaffirmed -Follow daily weights and strict I's and O's -Patient reported decreased oral intake for the last week and also vomiting; which increases GI losses and give concern for underlying dehydration-likely contributing to soft BP -Gentle fluids were given over 12 hours to assist with low blood pressure  and proper hydration, most recent sbp 99/66  8-ESRD on dialysis [facility]) -Nephrology following -Continue peritoneal dialysis per  neph -Continue calcitriol and Rena-Vite  9-secondary hyperparathyroidism -continue the use of Fosrenol  10-Hypokalemia -Magnesium level 1.5 with recheck of 1.8 -Patient received potassium and magnesium supplementation while in ED -We will follow electrolytes trend, replete  11-Anxiety -Continue as needed anxiolytics. -Mood stable currently.  DVT prophylaxis: Heparin SQ  Code Status: Full    Code Status Orders  (From admission, onward)         Start     Ordered   04/20/19 1217  Full code  Continuous     04/20/19 1216        Code Status History    Date Active Date Inactive Code Status Order ID Comments User Context   12/21/2018 1631 01/04/2019 2125 Full Code [id number]  Murlean Iba, MD Inpatient   06/27/2018 1614 06/30/2018 1659 Full Code [id number]  Phillips Grout, MD ED   08/30/2017 1010 08/30/2017 1522 Full Code [id number]  Clovis Riley, MD Inpatient   08/04/2017 0820 08/04/2017 1227 Full Code [id number]  Bensimhon, Shaune Pascal, MD Inpatient   07/26/2017 1124 07/26/2017 1531 Full Code [id number]  Serafina Mitchell, MD Inpatient   01/18/2017 2214 02/01/2017 1756 Full Code [id number]  Phillips Grout, MD Inpatient   12/31/2016 2234 01/06/2017 1438 Full Code [id number]  Orvan Falconer, MD Inpatient   11/25/2016 0947 11/25/2016 1330 Full Code [id number]  Conrad Foresthill, MD Inpatient   08/12/2016 0623 08/15/2016 2318 Full Code [id number]  Oswald Hillock, MD ED   06/07/2016 0145 06/11/2016 1525 Full Code [id number]  Jani Gravel, MD Inpatient   12/08/2015 0502 12/10/2015 1920 Full Code [id number]  Oswald Hillock, MD Inpatient   11/13/2015 1930 12/05/2015 2241 Full Code [id number]  Samuella Cota, MD Inpatient   08/31/2012 1849 09/09/2012 1540 Full Code [id number]  Alonza Bogus, MD Inpatient   06/30/2012 1840 07/08/2012 1539 Full Code [id number]  Asencion Noble, MD Inpatient    03/08/2012 0428 03/13/2012 1535 Full Code [id number]  Anne Ng, RN Inpatient   11/11/2011 1526 11/22/2011 1631 Full Code [id number]  Angelita Ingles, RN Inpatient     Family Communication: son by phone  Disposition Plan: Pt will remiain inpt for continued treatment with IV abx for presumed intra-abdominal infection, PR with subspecialty evaluation with possible conversion to HD, freqeunt nurse monitoring and labchecks.  Without these interventions pt risks overwhelming infection, electrolyte andnormalities and possible death.  Consults called: None Admission status: Inpatient   Consultants:   nephrology  Procedures:  Ct Abdomen Pelvis Wo Contrast  Result Date: 04/20/2019 CLINICAL DATA:  Initial evaluation for acute abdominal pain, shortness of breath while undergoing peritoneal dialysis. EXAM: CT ABDOMEN AND PELVIS WITHOUT CONTRAST TECHNIQUE: Multidetector CT imaging of the abdomen and pelvis was performed following the standard protocol without IV contrast. COMPARISON:  Prior CT from 12/27/2018. FINDINGS: Lower chest: Atelectatic changes seen dependently within the visualized lung bases. Visualized lungs are otherwise clear. Hepatobiliary: Liver demonstrates a normal unenhanced appearance. Gallbladder absent. No biliary dilatation. Pancreas: Pancreas within normal limits. Spleen: Spleen within normal limits. Adrenals/Urinary Tract: Adrenal glands are normal. Kidneys equal in size without nephrolithiasis or hydronephrosis. No hydroureter. Bladder decompressed without abnormality. Stomach/Bowel: Stomach within normal limits. No evidence for bowel obstruction. No evidence for acute appendicitis. Mild colonic diverticulosis without evidence for acute diverticulitis. No acute inflammatory changes seen about the bowels. Vascular/Lymphatic: Intra-abdominal aorta of normal caliber. Mild aorto bi-iliac atherosclerotic disease. No adenopathy. Reproductive: Prior hysterectomy.  No adnexal mass. Other:  Peritoneal dialysis catheter in place extending via  the left lower quadrant, tip position within the left lower quadrant stable from previous. Persistent 2.2 cm collection at the entry site of the catheter, slightly decreased from previous (series 2, image 68). Persistent soft tissue stranding within the partially visualized adjacent abdominal wall extending into the pannus, suggesting persistent cellulitis/panniculitis, improved in appearance relative to previous exam. No soft tissue emphysema. Scattered free air with trace free fluid within the abdomen compatible with dialysis. Musculoskeletal: No acute osseous abnormality. No discrete lytic or blastic osseous lesions. IMPRESSION: 1. Persistent soft tissue stranding within the left anterior abdominal wall and pannus, suggesting possible cellulitis/panniculitis, improved in appearance relative to most recent CT from 12/27/2018. Persistent 2.2 cm collection at the peritoneal dialysis catheter in treat point, slightly decreased from previous as well. 2. No other acute intra-abdominal or pelvic process. 3. Free intraperitoneal air, compatible with peritoneal dialysis. 4. Mild colonic diverticulosis without evidence for acute diverticulitis. Electronically Signed   By: Jeannine Boga M.D.   On: 04/20/2019 04:32     Antimicrobials:   vanc and zosyn day 2    Subjective: Pt reported abd pain with PD and had to stop PD.  Currently she is resting in bed comfortably.  Discussed plan of care with son on phone bedside, all questions answered  Objective: Vitals:   04/20/19 2357 04/21/19 0050 04/21/19 0606 04/21/19 0723  BP: 116/66 113/66 (!) 76/57 99/66  Pulse: 78 82 66   Resp: (!) _0 Temp: (!) 97.5 F (36.4 C) (!) 97.4 F (36.3 C) (!) 97.3 F (36.3 C)   TempSrc: Oral Oral Oral   SpO2: 96% 97% 100%   Weight: (!) 140.4 kg  (!) 140.9 kg   Height:        Intake/Output Summary (Last 24 hours) at 04/21/2019 1145 Last data filed at 04/20/2019  1700 Gross per 24 hour  Intake 240 ml  Output --  Net 240 ml   Filed Weights   04/20/19 1813 04/20/19 2357 04/21/19 0606  Weight: (!) 140.6 kg (!) 140.4 kg (!) 140.9 kg    Examination:  General exam: Appears calm and comfortable  Respiratory system: Clear to auscultation. Respiratory effort normal. Cardiovascular system: S1 & S2 heard, RRR. No JVD, murmurs, rubs, gallops or clicks. No pedal edema. Gastrointestinal system: Abdomen is nondistended, soft and mildy tender. No organomegaly or masses felt. Quiet bowel sounds heard. Central nervous system: Alert and oriented. No focal neurological deficits. Extremities: WWP, trace edema. Skin: No rashes, lesions or ulcers Psychiatry: Judgement and insight appear normal. Mood & affect appropriate.     Data Reviewed: I have personally reviewed following labs and imaging studies  CBC: Recent Labs  Lab 04/20/19 0229 04/20/19 1228 04/21/19 0222  WBC 14.8* 13.2* 11.5*  NEUTROABS 12.7*  --   --   HGB 11.0* 10.8* 10.5*  HCT 34.6* 33.1* 32.5*  MCV 99.7 97.6 98.2  PLT 316 288 501   Basic Metabolic Panel: Recent Labs  Lab 04/20/19 0229 04/20/19 1228 04/21/19 0222  NA 131*  --  135  K 3.0*  --  4.0  CL 90*  --  97*  CO2 25  --  23  GLUCOSE 177*  --  143*  BUN 41*  --  42*  CREATININE 9.49* 9.27* 9.67*  CALCIUM 8.1*  --  8.5*  MG 1.5* 1.8  --   PHOS  --  5.7*  --    GFR: Estimated Creatinine Clearance: 9.7 mL/min (A) (by C-G formula based on SCr  of 9.67 mg/dL (H)). Liver Function Tests: Recent Labs  Lab 04/20/19 0229  AST 14*  ALT 14  ALKPHOS 136*  BILITOT 0.6  PROT 6.5  ALBUMIN 2.3*   Recent Labs  Lab 04/20/19 0229  LIPASE 24   No results for input(s): AMMONIA in the last 168 hours. Coagulation Profile: No results for input(s): INR, PROTIME in the last 168 hours. Cardiac Enzymes: No results for input(s): CKTOTAL, CKMB, CKMBINDEX, TROPONINI in the last 168 hours. BNP (last 3 results) No results for  input(s): PROBNP in the last 8760 hours. HbA1C: Recent Labs    04/20/19 1228  HGBA1C 7.1*   CBG: Recent Labs  Lab 04/20/19 1214 04/20/19 1622 04/20/19 2154 04/21/19 0745 04/21/19 1127  GLUCAP 115* 131* 123* 86 99   Lipid Profile: No results for input(s): CHOL, HDL, LDLCALC, TRIG, CHOLHDL, LDLDIRECT in the last 72 hours. Thyroid Function Tests: No results for input(s): TSH, T4TOTAL, FREET4, T3FREE, THYROIDAB in the last 72 hours. Anemia Panel: No results for input(s): VITAMINB12, FOLATE, FERRITIN, TIBC, IRON, RETICCTPCT in the last 72 hours. Sepsis Labs: No results for input(s): PROCALCITON, LATICACIDVEN in the last 168 hours.  Recent Results (from the past 240 hour(s))  Gram stain     Status: None   Collection Time: 04/20/19  2:30 AM  Result Value Ref Range Status   Specimen Description PERITONEAL DIALYSATE  Final   Special Requests Normal  Final   Gram Stain   Final    CYTOSPIN SMEAR WBC PRESENT, PREDOMINANTLY PMN NO ORGANISMS SEEN RESULT CALLED TO, READ BACK BY AND VERIFIED WITH: J SAPPELT,RN _0  04/20/19 [facility] Performed at [facility], [address] [place], [place] [address]    Report Status 04/20/2019 FINAL  Final  Culture, body fluid-bottle     Status: None (Preliminary result)   Collection Time: 04/20/19  2:30 AM  Result Value Ref Range Status   Specimen Description   Final    PERITONEAL DIALYSATE Performed at [facility], [address] [place], [place] [address]    Special Requests   Final    PERITONEAL DIALYSATE Performed at [facility], [address], [place], [place] [address]    Gram Stain   Final    GRAM POSITIVE COCCI ANAEROBIC BOTTLE Gram Stain Report Called to,Read Back By and Verified With: M. MUELLER _1   04/20/19 [facility] Performed at [facility], [address], [place], [place] [address]    Culture   Final    CULTURE REINCUBATED FOR BETTER GROWTH Performed at [facility], [place] [address] [place], [place] [address]    Report  Status PENDING  Incomplete  Stat Gram stain     Status: None   Collection Time: 04/21/19  1:31 AM  Result Value Ref Range Status   Specimen Description PERITONEAL FLUID  Final   Special Requests NONE  Final   Gram Stain   Final    WBC PRESENT, PREDOMINANTLY PMN NO ORGANISMS SEEN CYTOSPIN SMEAR Performed at [facility], [place] [address] [place], [place] [address]    Report Status 04/21/2019 FINAL  Final  Culture, body fluid-bottle     Status: None (Preliminary result)   Collection Time: 04/21/19  1:33 AM  Result Value Ref Range Status   Specimen Description PERITONEAL FLUID  Final   Special Requests   Final    BOTTLES DRAWN AEROBIC AND ANAEROBIC Blood Culture adequate volume   Culture   Final    NO GROWTH < 12 HOURS Performed at [facility], [place] [address] [place], [place] [address]    Report Status PENDING  Incomplete         Radiology Studies: Ct Abdomen Pelvis Wo Contrast  Result Date: 04/20/2019 CLINICAL DATA:  Initial evaluation for acute abdominal pain, shortness of breath while undergoing peritoneal dialysis. EXAM: CT ABDOMEN AND PELVIS WITHOUT CONTRAST TECHNIQUE: Multidetector CT imaging of the abdomen and pelvis was performed following the standard protocol without IV contrast. COMPARISON:  Prior CT from 12/27/2018. FINDINGS: Lower chest: Atelectatic changes seen dependently within the visualized lung bases. Visualized lungs are otherwise clear. Hepatobiliary: Liver demonstrates a normal unenhanced appearance. Gallbladder absent. No biliary dilatation. Pancreas: Pancreas within normal limits. Spleen: Spleen within normal limits. Adrenals/Urinary Tract: Adrenal glands are normal. Kidneys equal in size without nephrolithiasis or hydronephrosis. No hydroureter. Bladder decompressed without abnormality. Stomach/Bowel: Stomach within normal limits. No evidence for bowel obstruction. No evidence for acute appendicitis. Mild colonic diverticulosis without evidence for  acute diverticulitis. No acute inflammatory changes seen about the bowels. Vascular/Lymphatic: Intra-abdominal aorta of normal caliber. Mild aorto bi-iliac atherosclerotic disease. No adenopathy. Reproductive: Prior hysterectomy.  No adnexal mass. Other: Peritoneal dialysis catheter in place extending via the left lower quadrant, tip position within the left lower quadrant stable from previous. Persistent 2.2 cm collection at the entry site of the catheter, slightly decreased from previous (series 2, image 68). Persistent soft tissue stranding within the partially visualized adjacent abdominal wall extending into the pannus, suggesting persistent cellulitis/panniculitis, improved in appearance relative to previous exam. No soft tissue emphysema. Scattered free air with trace free fluid within the abdomen compatible with dialysis. Musculoskeletal: No acute osseous abnormality. No discrete lytic or blastic osseous lesions. IMPRESSION: 1. Persistent soft tissue stranding within the left anterior abdominal wall and pannus, suggesting possible cellulitis/panniculitis, improved in appearance relative to most recent CT from 12/27/2018. Persistent 2.2 cm collection at the peritoneal dialysis catheter in treat point, slightly decreased from previous as well. 2. No other acute intra-abdominal or pelvic process. 3. Free intraperitoneal air, compatible with peritoneal dialysis. 4. Mild colonic diverticulosis without evidence for acute diverticulitis. Electronically Signed   By: Jeannine Boga M.D.   On: 04/20/2019 04:32        Scheduled Meds:  calcitRIOL  0.5 mcg Oral Daily   docusate sodium  100 mg Oral BID   gentamicin cream  1 application Topical Daily   heparin  5,000 Units Subcutaneous Q8H   insulin aspart  0-5 Units Subcutaneous QHS   insulin aspart  0-9 Units Subcutaneous TID WC   insulin detemir  5 Units Subcutaneous QHS   lactulose  30 g Oral TID   lanthanum  1,000 mg Oral With snacks    lanthanum  1,500 mg Oral TID WC   multivitamin  1 tablet Oral QHS   potassium chloride SA  40 mEq Oral Daily   vancomycin variable dose per unstable renal function (pharmacist dosing)   Does not apply See admin instructions   Continuous Infusions:  sodium chloride 75 mL/hr at 04/21/19 0503   dialysis solution 1.5% low-MG/low-CA     dialysis solution 2.5% low-MG/low-CA     piperacillin-tazobactam (ZOSYN)  IV       LOS: 1 day    Time spent: 35 min    Nicolette Bang, MD [facility]  If 7PM-7AM, please contact night-coverage  04/21/2019, 11:45 AM

## 2019-04-21 NOTE — Progress Notes (Signed)
Admit: 04/20/2019 LOS: 1  37F ESRD on PD, 55moanorexia, and acute onset abdominal pain after initiation of PD yesterday evening.  TNCs elevated but only 16% PMNs; PD Cx with GPC  Subjective:  . Again had inflow abd pain with PD, req termination after 1 cycle . Repeat cell count is normal  . Rec Vanc IV  . Hasn't had BM in 7d  05/01 0701 - 05/02 0700 In: 1240.4 [P.O.:240; IV Piggyback:1000.4] Out: -   Filed Weights   04/20/19 1813 04/20/19 2357 04/21/19 0606  Weight: (!) 140.6 kg (!) 140.4 kg (!) 140.9 kg    Scheduled Meds: . calcitRIOL  0.5 mcg Oral Daily  . docusate sodium  100 mg Oral BID  . gentamicin cream  1 application Topical Daily  . heparin  5,000 Units Subcutaneous Q8H  . insulin aspart  0-5 Units Subcutaneous QHS  . insulin aspart  0-9 Units Subcutaneous TID WC  . insulin detemir  5 Units Subcutaneous QHS  . lactulose  30 g Oral TID  . lanthanum  1,000 mg Oral With snacks  . lanthanum  1,500 mg Oral TID WC  . multivitamin  1 tablet Oral QHS  . potassium chloride SA  40 mEq Oral Daily  . vancomycin variable dose per unstable renal function (pharmacist dosing)   Does not apply See admin instructions   Continuous Infusions: . sodium chloride 75 mL/hr at 04/21/19 0503  . dialysis solution 1.5% low-MG/low-CA    . dialysis solution 2.5% low-MG/low-CA    . magnesium sulfate 1 - 4 g bolus IVPB    . piperacillin-tazobactam (ZOSYN)  IV     PRN Meds:.acetaminophen, albuterol, ALPRAZolam, dianeal solution for CAPD/CCPD with heparin, dianeal solution for CAPD/CCPD with heparin, ondansetron **OR** ondansetron (ZOFRAN) IV  Current Labs: reviewed  Results for WPERRI, ARAGONES(MRN [id number] as of 04/21/2019 11:56  Ref. Range 04/20/2019 02:30 04/21/2019 00:55  WBC, Fluid Latest Ref Range: 0 - 1,000 cu mm 438 20  Lymphs, Fluid Latest Units: % 17 6  Eos, Fluid Latest Units: % 9 4  Appearance, Fluid Latest Ref Range: CLEAR  CLEAR CLEAR  Neutrophil Count, Fluid Latest Ref Range: 0  - 25 % 16 83 (H)  Monocyte-Macrophage-Serous Fluid Latest Ref Range: 50 - 90 % 58 7 (L)     Physical Exam:  Blood pressure 99/66, pulse 66, temperature (!) 97.3 F (36.3 C), temperature source Oral, resp. rate 16, height _0  (1.651 m), weight (!) 140.9 kg, SpO2 100 %. GEN: Obese in no distress, lying supine normal respiratory effort ENT: NCAT EYES: EOMI CV: Regular, normal S1 and S2 PULM: Clear bilaterally with normal work of breathing ABD: Large pannus, PD exit site in left upper quadrant, exit sites with normal-appearing sinus, no surrounding erythema or purulence expressed SKIN: As above EXT: No significant edema  A  1. ESRD on PD 2. Likely PD Peritonitis, PD FLuid Cx GPC in cocci 3. 1 mo of anorexia and weight loss, ? Smoldering peritonitis 4. New onset inflow pain with PD  5. L RC AVF 6. Leukocytosis, probably #2 7. FLuid collection surrounding PD catheter, smaller compared to January, of unclera clinical significance 8. Hypokalemia, mild; on 478m KCl/d as outpt 9. 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 Cont Vancomycin, await Cx results . Will give lactulose TID + colace; stop stimulant laxative . If has good BM today will attempt PD again tonight . I think we can stop Zosyn . Medication Issues; o Preferred narcotic  agents for pain control are hydromorphone, fentanyl, and methadone. Morphine should not be used.  o Baclofen should be avoided o Avoid oral sodium phosphate and magnesium citrate based laxatives / bowel preps    Pearson Grippe MD 04/21/2019, 11:54 AM  Recent Labs  Lab 04/20/19 0229 04/20/19 1228 04/21/19 0222  NA 131*  --  135  K 3.0*  --  4.0  CL 90*  --  97*  CO2 25  --  23  GLUCOSE 177*  --  143*  BUN 41*  --  42*  CREATININE 9.49* 9.27* 9.67*  CALCIUM 8.1*  --  8.5*  PHOS  --  5.7*  --    Recent Labs  Lab 04/20/19 0229 04/20/19 1228 04/21/19 0222  WBC 14.8* 13.2* 11.5*  NEUTROABS 12.7*  --   --   HGB 11.0* 10.8* 10.5*  HCT 34.6* 33.1*  32.5*  MCV 99.7 97.6 98.2  PLT 316 288 280

## 2019-04-21 NOTE — Significant Event (Signed)
New admit ovenright, initially soft BP but responded to gentle hydration, significantlly decreased PO intake I will give small bolus 500 cc x 1, monitor closely

## 2019-04-21 NOTE — Progress Notes (Signed)
Patient c/o severe abdominal pain after starting PD last night. She stated the pain was similar to what she experienced at home. HD nurse made aware and came to assess patient. However pt had already turned PD machine off prior to her arrival. No more c/o abdominal pain. Will continue to monitor.

## 2019-04-21 NOTE — Progress Notes (Addendum)
Pt refused Peritoneal Dialysis. HD department updated Dane RN   Dr Wyonia Hough updated with latest BP 92/56 taken manually. New order to keep Normal saline at 75cc/hr after bolus is completed with a stop IV fluids at 0700  AM of  04/22/2019.  Dr. Wyonia Hough updated with BP at 79/43 with 250 cc NS infused . Another 250 to infuse to complete 500 cc bolus as per order.

## 2019-04-21 NOTE — Progress Notes (Signed)
Dr. Wyonia Hough updated on pt BP 75/48 with recheck at 82/50. New order received. Will continue to monitor.

## 2019-04-22 LAB — GLUCOSE, CAPILLARY
Glucose-Capillary: 60 mg/dL — ABNORMAL LOW (ref 70–99)
Glucose-Capillary: 89 mg/dL (ref 70–99)
Glucose-Capillary: 113 mg/dL — ABNORMAL HIGH (ref 70–99)
Glucose-Capillary: 112 mg/dL — ABNORMAL HIGH (ref 70–99)
Glucose-Capillary: 88 mg/dL (ref 70–99)

## 2019-04-22 LAB — CULTURE, BODY FLUID W GRAM STAIN -BOTTLE

## 2019-04-22 LAB — CBC WITH DIFFERENTIAL/PLATELET
Abs Immature Granulocytes: 0.11 10*3/uL — ABNORMAL HIGH (ref 0.00–0.07)
Basophils Absolute: 0.1 10*3/uL (ref 0.0–0.1)
Basophils Relative: 1 %
Eosinophils Absolute: 0.4 10*3/uL (ref 0.0–0.5)
Eosinophils Relative: 4 %
HCT: 31.8 % — ABNORMAL LOW (ref 36.0–46.0)
Hemoglobin: 10.2 g/dL — ABNORMAL LOW (ref 12.0–15.0)
Immature Granulocytes: 1 %
Lymphocytes Relative: 13 %
Lymphs Abs: 1.1 10*3/uL (ref 0.7–4.0)
MCH: 31.5 pg (ref 26.0–34.0)
MCHC: 32.1 g/dL (ref 30.0–36.0)
MCV: 98.1 fL (ref 80.0–100.0)
Monocytes Absolute: 0.6 10*3/uL (ref 0.1–1.0)
Monocytes Relative: 7 %
Neutro Abs: 6.5 10*3/uL (ref 1.7–7.7)
Neutrophils Relative %: 74 %
Platelets: 286 10*3/uL (ref 150–400)
RBC: 3.24 MIL/uL — ABNORMAL LOW (ref 3.87–5.11)
RDW: 14.2 % (ref 11.5–15.5)
WBC: 8.6 10*3/uL (ref 4.0–10.5)
nRBC: 0 % (ref 0.0–0.2)

## 2019-04-22 LAB — BASIC METABOLIC PANEL
Anion gap: 17 — ABNORMAL HIGH (ref 5–15)
BUN: 48 mg/dL — ABNORMAL HIGH (ref 6–20)
CO2: 20 mmol/L — ABNORMAL LOW (ref 22–32)
Calcium: 8.4 mg/dL — ABNORMAL LOW (ref 8.9–10.3)
Chloride: 100 mmol/L (ref 98–111)
Creatinine, Ser: 10.65 mg/dL — ABNORMAL HIGH (ref 0.44–1.00)
GFR calc Af Amer: 4 mL/min — ABNORMAL LOW (ref >60)
GFR calc non Af Amer: 4 mL/min — ABNORMAL LOW (ref >60)
Glucose, Bld: 80 mg/dL (ref 70–99)
Potassium: 3.4 mmol/L — ABNORMAL LOW (ref 3.5–5.1)
Sodium: 137 mmol/L (ref 135–145)

## 2019-04-22 LAB — CULTURE, BODY FLUID-BOTTLE

## 2019-04-22 MED ORDER — LACTULOSE 10 GM/15ML PO SOLN
30.0000 g | Freq: Two times a day (BID) | ORAL | Status: DC
  Administered 2019-04-22 – 2019-04-29 (×5): 30 g via ORAL
  Filled 2019-04-22 (×12): qty 45

## 2019-04-22 MED ORDER — GENTAMICIN SULFATE 0.1 % EX CREA
1.0000 "application " | TOPICAL_CREAM | Freq: Every day | CUTANEOUS | Status: DC
  Administered 2019-04-22 – 2019-04-25 (×4): 1 via TOPICAL
  Filled 2019-04-22: qty 15

## 2019-04-22 NOTE — Progress Notes (Signed)
PROGRESS NOTE    [name]  DTH:[id number] DOB: 1966/11/22 DOA: 04/20/2019 PCP: [name], MD   Brief Narrative:  Per HPI: Lindsay Hemmer Wheeleris a 53 y.o.femalewith a past medical history significant for anxiety, chronic diastolic heart failure, history of fibromyalgia, type 2 diabetes mellitus chronically on insulin with nephropathy, chronic respiratory failure, morbid obesity with hypoventilation syndromeandend-stage renal disease on peritoneal dialysis;who presented to the emergency department secondary to acute development of abdominal pain (left lower quadrant), 9 out of 10 in intensity when first presented with subsequent improvement to 4-5 out of 10;associated nausea/vomiting (no blood appreciated in vomit content),nothing makes condition better or worse. Patient reports partially being able to initiate her nocturnal peritoneal dialysis treatment.Patient also expressed some ongoing constipation and decrease in her appetite. She denies any fever, chills, cough, chest pain, shortness of breath, melena, hematochezia, focal weakness, sick contacts or any other complaints.  In the ED patient was found with a low blood pressure, elevated WBCsandCT scan with some improvement in her recent fluid collection and peritonitis changes;no fever. IV fluids were given, cultures taken, IV antibiotics started. Due to ongoing low blood pressure and concern for underlying peritonitis trial hospital has been called to admit patient for further evaluation and management.   Assessment & Plan:   Principal Problem:   Abdominal pain Active Problems:   Diabetes mellitus (HCC)   Morbid obesity (HCC)   HTN (hypertension)   HLD (hyperlipidemia)   Obesity hypoventilation syndrome (HCC)   Chronic diastolic CHF (congestive heart failure) ([place])   ESRD on dialysis ([place])   Hypokalemia   Peritonitis ([place])   1-abdominal pain/Peritonitis:In a patient with history of peritoneal  dialysis -Continue with antibiotics, follow cultures, -Patient did refuse peritoneal dialysis secondary to pain but reports this morning pain is resolved. -PRN analgesics and antiemetics will be provided.  2-Diabetes mellitus (HCC) -7.1 A1c -Continue low-dose Lantus nightly -cont sliding scale insulin  3-Morbid obesity (HCC) -Body mass index is 47.69 kg/m. -patient reported some weight loss recently -encourage to continue following low calorie diet, portion control and increase physical activity   4-HTN (hypertension) -on presentation BP is low, still soft -will holdhypertensive agents -Provide fluid resuscitation with a cautious administration given end-stage renal disease -Follow vital signs.  5-HLD (hyperlipidemia) -continue heat healthy diet -not using statins currently -repeat lipid panel as an outpatient   6-Obesity hypoventilation syndrome (HCC)/chronic respiratory failure -Chronically using 2 L of oxygenNC -Reports no shortness of breath -Continue oxygen supplementation.  7-Chronic diastolic CHF (congestive heart failure) (HCC) -remains stable and compensated currently -no changes, continue volume control through dialysis -Education about low-sodium diet provided and reaffirmed -Follow daily weights and strict I's and O's -Patient reported decreased oral intake for the last week and also vomiting;which increases GI losses and give concern for underlying dehydration-likely contributing to soft BP   8-ESRD on dialysis [facility]) -Nephrology following -Continue peritoneal dialysis per neph -Continue calcitriol and Rena-Vite  9-secondary hyperparathyroidism -continue the use ofFosrenol  10-Hypokalemia -Magnesium level 1.5 with recheck of 1.8 -Patient received potassium and magnesium supplementation while in ED -We will follow electrolytes trend, replete  11-Anxiety -Continue as needed anxiolytics. -Mood stable currently.   DVT prophylaxis:  Heparin SQ  Code Status: full    Code Status Orders  (From admission, onward)         Start     Ordered   04/20/19 1217  Full code  Continuous     04/20/19 1216        Code Status  History    Date Active Date Inactive Code Status Order ID Comments User Context   12/21/2018 1631 01/04/2019 2125 Full Code [id number]  Murlean Iba, MD Inpatient   06/27/2018 1614 06/30/2018 1659 Full Code [id number]  Phillips Grout, MD ED   08/30/2017 1010 08/30/2017 1522 Full Code [id number]  Clovis Riley, MD Inpatient   08/04/2017 0820 08/04/2017 1227 Full Code [id number]  Bensimhon, Shaune Pascal, MD Inpatient   07/26/2017 1124 07/26/2017 1531 Full Code [id number]  Serafina Mitchell, MD Inpatient   01/18/2017 2214 02/01/2017 1756 Full Code [id number]  Phillips Grout, MD Inpatient   12/31/2016 2234 01/06/2017 1438 Full Code [id number]  Orvan Falconer, MD Inpatient   11/25/2016 0947 11/25/2016 1330 Full Code [id number]  Conrad [place], MD Inpatient   08/12/2016 [phone] 08/15/2016 2318 Full Code [id number]  Oswald Hillock, MD ED   06/07/2016 0145 06/11/2016 1525 Full Code [id number]  Jani Gravel, MD Inpatient   12/08/2015 0502 12/10/2015 1920 Full Code [id number]  Oswald Hillock, MD Inpatient   11/13/2015 1930 12/05/2015 2241 Full Code [id number]  Samuella Cota, MD Inpatient   08/31/2012 1849 09/09/2012 1540 Full Code [id number]  Alonza Bogus, MD Inpatient   06/30/2012 1840 07/08/2012 1539 Full Code [id number]  Asencion Noble, MD Inpatient   03/08/2012 0428 03/13/2012 1535 Full Code [id number]  Anne Ng, RN Inpatient   11/11/2011 1526 11/22/2011 1631 Full Code [id number]  Angelita Ingles, RN Inpatient     Family Communication: none today  Disposition Plan:   Pt will remiain inpt for CLOSE MANAGEMENT OF HYPOTENSION, treatment with IV abx for presumed intra-abdominal infection, PD with subspecialty evaluation with possible conversion to HD, freqeunt nurse monitoring and labchecks.  Without these interventions pt risks overwhelming infection,  electrolyte andnormalities and possible death.  Consults called: None Admission status: Inpatient   Consultants:   NEPHROLOGY  Procedures:  Ct Abdomen Pelvis Wo Contrast  Result Date: 04/20/2019 CLINICAL DATA:  Initial evaluation for acute abdominal pain, shortness of breath while undergoing peritoneal dialysis. EXAM: CT ABDOMEN AND PELVIS WITHOUT CONTRAST TECHNIQUE: Multidetector CT imaging of the abdomen and pelvis was performed following the standard protocol without IV contrast. COMPARISON:  Prior CT from 12/27/2018. FINDINGS: Lower chest: Atelectatic changes seen dependently within the visualized lung bases. Visualized lungs are otherwise clear. Hepatobiliary: Liver demonstrates a normal unenhanced appearance. Gallbladder absent. No biliary dilatation. Pancreas: Pancreas within normal limits. Spleen: Spleen within normal limits. Adrenals/Urinary Tract: Adrenal glands are normal. Kidneys equal in size without nephrolithiasis or hydronephrosis. No hydroureter. Bladder decompressed without abnormality. Stomach/Bowel: Stomach within normal limits. No evidence for bowel obstruction. No evidence for acute appendicitis. Mild colonic diverticulosis without evidence for acute diverticulitis. No acute inflammatory changes seen about the bowels. Vascular/Lymphatic: Intra-abdominal aorta of normal caliber. Mild aorto bi-iliac atherosclerotic disease. No adenopathy. Reproductive: Prior hysterectomy.  No adnexal mass. Other: Peritoneal dialysis catheter in place extending via the left lower quadrant, tip position within the left lower quadrant stable from previous. Persistent 2.2 cm collection at the entry site of the catheter, slightly decreased from previous (series 2, image 68). Persistent soft tissue stranding within the partially visualized adjacent abdominal wall extending into the pannus, suggesting persistent cellulitis/panniculitis, improved in appearance relative to previous exam. No soft tissue  emphysema. Scattered free air with trace free fluid within the abdomen compatible with dialysis. Musculoskeletal: No acute osseous abnormality. No discrete lytic or blastic osseous lesions. IMPRESSION: 1. Persistent soft tissue stranding within the left anterior abdominal wall  and pannus, suggesting possible cellulitis/panniculitis, improved in appearance relative to most recent CT from 12/27/2018. Persistent 2.2 cm collection at the peritoneal dialysis catheter in treat point, slightly decreased from previous as well. 2. No other acute intra-abdominal or pelvic process. 3. Free intraperitoneal air, compatible with peritoneal dialysis. 4. Mild colonic diverticulosis without evidence for acute diverticulitis. Electronically Signed   By: Jeannine Boga M.D.   On: 04/20/2019 04:32     Antimicrobials:   VANC DAY 3  ZOSYN DAY 3 D/C'D PER CX RESULTS TODAY    Subjective: Reports abd pain is significantly improved, none currently, plans on doing PD tonight  Objective: Vitals:   04/22/19 0500 04/22/19 0556 04/22/19 0827 04/22/19 1146  BP:  (!) 81/50 (!) 73/41 (!) 88/42  Pulse:  64 66 72  Resp:  17    Temp:  97.6 F (36.4 C) (!) 97.4 F (36.3 C) 97.6 F (36.4 C)  TempSrc:  Oral Oral Oral  SpO2:  96% 98% 97%  Weight: (!) 142 kg     Height:        Intake/Output Summary (Last 24 hours) at 04/22/2019 1253 Last data filed at 04/22/2019 0845 Gross per 24 hour  Intake 720 ml  Output --  Net 720 ml   Filed Weights   04/20/19 2357 04/21/19 0606 04/22/19 0500  Weight: (!) 140.4 kg (!) 140.9 kg (!) 142 kg    Examination:  General exam: Appears calm and comfortable  Respiratory system: Clear to auscultation. Respiratory effort normal. Cardiovascular system: S1 & S2 heard, RRR. No JVD, murmurs, rubs, gallops or clicks. No pedal edema. Gastrointestinal system: Abdomen is nondistended, soft, mildly tender with deep palpation. No organomegaly or masses felt. Normal bowel sounds heard. Central  nervous system: Alert and oriented. No focal neurological deficits. Extremities: WWP, trace edema. Skin: No rashes, lesions or ulcers Psychiatry: Judgement and insight appear normal. Mood & affect appropriate.     Data Reviewed: I have personally reviewed following labs and imaging studies  CBC: Recent Labs  Lab 04/20/19 0229 04/20/19 1228 04/21/19 0222 04/22/19 0413  WBC 14.8* 13.2* 11.5* 8.6  NEUTROABS 12.7*  --   --  6.5  HGB 11.0* 10.8* 10.5* 10.2*  HCT 34.6* 33.1* 32.5* 31.8*  MCV 99.7 97.6 98.2 98.1  PLT 316 288 280 829   Basic Metabolic Panel: Recent Labs  Lab 04/20/19 0229 04/20/19 1228 04/21/19 0222 04/22/19 0413  NA 131*  --  135 137  K 3.0*  --  4.0 3.4*  CL 90*  --  97* 100  CO2 25  --  23 20*  GLUCOSE 177*  --  143* 80  BUN 41*  --  42* 48*  CREATININE 9.49* 9.27* 9.67* 10.65*  CALCIUM 8.1*  --  8.5* 8.4*  MG 1.5* 1.8  --   --   PHOS  --  5.7*  --   --    GFR: Estimated Creatinine Clearance: 8.9 mL/min (A) (by C-G formula based on SCr of 10.65 mg/dL (H)). Liver Function Tests: Recent Labs  Lab 04/20/19 0229  AST 14*  ALT 14  ALKPHOS 136*  BILITOT 0.6  PROT 6.5  ALBUMIN 2.3*   Recent Labs  Lab 04/20/19 0229  LIPASE 24   No results for input(s): AMMONIA in the last 168 hours. Coagulation Profile: No results for input(s): INR, PROTIME in the last 168 hours. Cardiac Enzymes: No results for input(s): CKTOTAL, CKMB, CKMBINDEX, TROPONINI in the last 168 hours. BNP (last 3 results) No results  for input(s): PROBNP in the last 8760 hours. HbA1C: Recent Labs    04/20/19 1228  HGBA1C 7.1*   CBG: Recent Labs  Lab 04/21/19 1622 04/21/19 2204 04/22/19 0825 04/22/19 0930 04/22/19 1144  GLUCAP 89 91 60* 89 113*   Lipid Profile: No results for input(s): CHOL, HDL, LDLCALC, TRIG, CHOLHDL, LDLDIRECT in the last 72 hours. Thyroid Function Tests: No results for input(s): TSH, T4TOTAL, FREET4, T3FREE, THYROIDAB in the last 72 hours. Anemia  Panel: No results for input(s): VITAMINB12, FOLATE, FERRITIN, TIBC, IRON, RETICCTPCT in the last 72 hours. Sepsis Labs: No results for input(s): PROCALCITON, LATICACIDVEN in the last 168 hours.  Recent Results (from the past 240 hour(s))  Gram stain     Status: None   Collection Time: 04/20/19  2:30 AM  Result Value Ref Range Status   Specimen Description PERITONEAL DIALYSATE  Final   Special Requests Normal  Final   Gram Stain   Final    CYTOSPIN SMEAR WBC PRESENT, PREDOMINANTLY PMN NO ORGANISMS SEEN RESULT CALLED TO, READ BACK BY AND VERIFIED WITH: J SAPPELT,RN _0  04/20/19 [facility] Performed at [facility], [address] [place], [place] [address]    Report Status 04/20/2019 FINAL  Final  Culture, body fluid-bottle     Status: Abnormal (Preliminary result)   Collection Time: 04/20/19  2:30 AM  Result Value Ref Range Status   Specimen Description PERITONEAL DIALYSATE  Final   Special Requests PERITONEAL DIALYSATE  Final   Gram Stain   Final    GRAM POSITIVE COCCI ANAEROBIC BOTTLE Gram Stain Report Called to,Read Back By and Verified With: M. MUELLER _1   04/20/19 [facility]    Culture STAPHYLOCOCCUS EPIDERMIDIS (A)  Final   Report Status PENDING  Incomplete   Organism ID, Bacteria STAPHYLOCOCCUS EPIDERMIDIS  Final      Susceptibility   Staphylococcus epidermidis - MIC*    CIPROFLOXACIN <=0.5 SENSITIVE Sensitive     ERYTHROMYCIN >=8 RESISTANT Resistant     GENTAMICIN <=0.5 SENSITIVE Sensitive     OXACILLIN >=4 RESISTANT Resistant     TETRACYCLINE 2 SENSITIVE Sensitive     VANCOMYCIN 2 SENSITIVE Sensitive     TRIMETH/SULFA <=10 SENSITIVE Sensitive     CLINDAMYCIN <=0.25 SENSITIVE Sensitive     RIFAMPIN <=0.5 SENSITIVE Sensitive     Inducible Clindamycin Value in next row Sensitive      NEGATIVEPerformed at [facility] [address] [place], [place] [address]    * STAPHYLOCOCCUS EPIDERMIDIS  Stat Gram stain     Status: None   Collection Time: 04/21/19  1:31 AM   Result Value Ref Range Status   Specimen Description PERITONEAL FLUID  Final   Special Requests NONE  Final   Gram Stain   Final    WBC PRESENT, PREDOMINANTLY PMN NO ORGANISMS SEEN CYTOSPIN SMEAR Performed at [facility], [place] [address] [place], [place] [address]    Report Status 04/21/2019 FINAL  Final  Culture, body fluid-bottle     Status: None (Preliminary result)   Collection Time: 04/21/19  1:33 AM  Result Value Ref Range Status   Specimen Description PERITONEAL FLUID  Final   Special Requests   Final    BOTTLES DRAWN AEROBIC AND ANAEROBIC Blood Culture adequate volume   Culture   Final    NO GROWTH 1 DAY Performed at [facility], [place] [address], [place], [place] [address]    Report Status PENDING  Incomplete  Radiology Studies: No results found.      Scheduled Meds:  calcitRIOL  0.5 mcg Oral Daily   docusate sodium  100 mg Oral BID   gentamicin cream  1 application Topical Daily   heparin  5,000 Units Subcutaneous Q8H   insulin aspart  0-5 Units Subcutaneous QHS   insulin aspart  0-9 Units Subcutaneous TID WC   insulin detemir  5 Units Subcutaneous QHS   lactulose  30 g Oral BID   lanthanum  1,000 mg Oral With snacks   lanthanum  1,500 mg Oral TID WC   multivitamin  1 tablet Oral QHS   potassium chloride SA  40 mEq Oral Daily   vancomycin variable dose per unstable renal function (pharmacist dosing)   Does not apply See admin instructions   Continuous Infusions:  sodium chloride 75 mL/hr at 04/21/19 1958   dialysis solution 1.5% low-MG/low-CA     dialysis solution 2.5% low-MG/low-CA       LOS: 2 days    Time spent: 35 min    Nicolette Bang, MD [facility]  If 7PM-7AM, please contact night-coverage  04/22/2019, 12:53 PM

## 2019-04-22 NOTE — Progress Notes (Signed)
Admit: 04/20/2019 LOS: 2  21F ESRD on PD, 53moanorexia, and acute onset abdominal pain after initiation of PD yesterday evening.  TNCs elevated but only 16% PMNs; PD Cx with GPC  Subjective:  . No PD yesterday per pt request . Finally had several BMs . Soft BPs . K 3.4, BUN 48 . PD Cx with Staph Epi . On Vanc IV   05/02 0701 - 05/03 0700 In: 820 [P.O.:820] Out: -   Filed Weights   04/20/19 2357 04/21/19 0606 04/22/19 0500  Weight: (!) 140.4 kg (!) 140.9 kg (!) 142 kg    Scheduled Meds: . calcitRIOL  0.5 mcg Oral Daily  . docusate sodium  100 mg Oral BID  . gentamicin cream  1 application Topical Daily  . heparin  5,000 Units Subcutaneous Q8H  . insulin aspart  0-5 Units Subcutaneous QHS  . insulin aspart  0-9 Units Subcutaneous TID WC  . insulin detemir  5 Units Subcutaneous QHS  . lactulose  30 g Oral TID  . lanthanum  1,000 mg Oral With snacks  . lanthanum  1,500 mg Oral TID WC  . multivitamin  1 tablet Oral QHS  . potassium chloride SA  40 mEq Oral Daily  . vancomycin variable dose per unstable renal function (pharmacist dosing)   Does not apply See admin instructions   Continuous Infusions: . sodium chloride 75 mL/hr at 04/21/19 1958  . dialysis solution 1.5% low-MG/low-CA    . dialysis solution 2.5% low-MG/low-CA     PRN Meds:.acetaminophen, albuterol, ALPRAZolam, dianeal solution for CAPD/CCPD with heparin, dianeal solution for CAPD/CCPD with heparin, ondansetron **OR** ondansetron (ZOFRAN) IV  Current Labs: reviewed  Results for WSHERON, TALLMAN(MRN [id number] as of 04/21/2019 11:56  Ref. Range 04/20/2019 02:30 04/21/2019 00:55  WBC, Fluid Latest Ref Range: 0 - 1,000 cu mm 438 20  Lymphs, Fluid Latest Units: % 17 6  Eos, Fluid Latest Units: % 9 4  Appearance, Fluid Latest Ref Range: CLEAR  CLEAR CLEAR  Neutrophil Count, Fluid Latest Ref Range: 0 - 25 % 16 83 (H)  Monocyte-Macrophage-Serous Fluid Latest Ref Range: 50 - 90 % 58 7 (L)     Physical Exam:  Blood  pressure (!) 88/42, pulse 72, temperature 97.6 F (36.4 C), temperature source Oral, resp. rate 17, height _0  (1.651 m), weight (!) 142 kg, SpO2 97 %. GEN: Obese in no distress, lying supine normal respiratory effort ENT: NCAT EYES: EOMI CV: Regular, normal S1 and S2 PULM: Clear bilaterally with normal work of breathing ABD: Large pannus, PD exit site in left upper quadrant, exit sites with normal-appearing sinus, no surrounding erythema or purulence expressed SKIN: As above EXT: No significant edema  A  1. ESRD on PD 2. Staph Epi PD Peritonitis, but cell counts are unusual 3. Constpiation, improved with lactulose 4. 1 mo of anorexia and weight loss, ? Smoldering peritonitis 5. New onset inflow pain with PD, ? 2/2 #2 6. L RC AVF 7. Leukocytosis, probably #2; resolved 8. FLuid collection surrounding PD catheter, smaller compared to January, of unclera clinical significance 9. Hypokalemia, mild; on 452m KCl/d as outpt 10. 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 Will need 3wk course of Vanc, can transition to IP upon DC . Attempt PD tonight, now that stooling . BID Lactulose + Colace . If difficulty again tonight with inflow pain plan for iHD tomorrow and consider surgical consult  . Medication Issues; o Preferred narcotic agents for pain control are hydromorphone,  fentanyl, and methadone. Morphine should not be used.  o Baclofen should be avoided o Avoid oral sodium phosphate and magnesium citrate based laxatives / bowel preps    Pearson Grippe MD 04/22/2019, 11:53 AM  Recent Labs  Lab 04/20/19 0229 04/20/19 1228 04/21/19 0222 04/22/19 0413  NA 131*  --  135 137  K 3.0*  --  4.0 3.4*  CL 90*  --  97* 100  CO2 25  --  23 20*  GLUCOSE 177*  --  143* 80  BUN 41*  --  42* 48*  CREATININE 9.49* 9.27* 9.67* 10.65*  CALCIUM 8.1*  --  8.5* 8.4*  PHOS  --  5.7*  --   --    Recent Labs  Lab 04/20/19 0229 04/20/19 1228 04/21/19 0222 04/22/19 0413  WBC 14.8* 13.2* 11.5* 8.6   NEUTROABS 12.7*  --   --  6.5  HGB 11.0* 10.8* 10.5* 10.2*  HCT 34.6* 33.1* 32.5* 31.8*  MCV 99.7 97.6 98.2 98.1  PLT 316 288 280 286

## 2019-04-22 NOTE — Progress Notes (Signed)
This Probation officer was called in by HD day shift staff to terminate PD tx d/t pt c/o abdominal pain. Per HD staff nephrologist has been informed of above. Seen pt in room alert/oriented in no apparent distress,VSS. PD tx terminated as ordered by on call nephrologist. Report given to primary nurse Thressa Sheller.

## 2019-04-22 NOTE — Progress Notes (Addendum)
Pt. Connected to PD tx initiated at 1723 pt. In filling phase at approximate 1500 ml fill volume pt. C/o abdominal pain and pt, request to terminate now. Dr. Joelyn Oms telephoned left message no return call at this time. Pt. Continues to demand to stop tx this nurse terminates treatment at pt. Request. Pt alert and oriented. Also telephoned Dr. Jonnie Finner left message awaiting return call

## 2019-04-23 ENCOUNTER — Inpatient Hospital Stay (HOSPITAL_COMMUNITY): Payer: Medicare Other

## 2019-04-23 LAB — BODY FLUID CELL COUNT WITH DIFFERENTIAL
Eos, Fluid: 2 %
Lymphs, Fluid: 1 %
Monocyte-Macrophage-Serous Fluid: 7 % — ABNORMAL LOW (ref 50–90)
Neutrophil Count, Fluid: 90 % — ABNORMAL HIGH (ref 0–25)
Total Nucleated Cell Count, Fluid: 375 cu mm (ref 0–1000)

## 2019-04-23 LAB — CBC WITH DIFFERENTIAL/PLATELET
Abs Immature Granulocytes: 0.2 10*3/uL — ABNORMAL HIGH (ref 0.00–0.07)
Basophils Absolute: 0.1 10*3/uL (ref 0.0–0.1)
Basophils Relative: 1 %
Eosinophils Absolute: 0.4 10*3/uL (ref 0.0–0.5)
Eosinophils Relative: 4 %
HCT: 31.9 % — ABNORMAL LOW (ref 36.0–46.0)
Hemoglobin: 10.4 g/dL — ABNORMAL LOW (ref 12.0–15.0)
Immature Granulocytes: 2 %
Lymphocytes Relative: 12 %
Lymphs Abs: 1.1 10*3/uL (ref 0.7–4.0)
MCH: 32.1 pg (ref 26.0–34.0)
MCHC: 32.6 g/dL (ref 30.0–36.0)
MCV: 98.5 fL (ref 80.0–100.0)
Monocytes Absolute: 0.6 10*3/uL (ref 0.1–1.0)
Monocytes Relative: 6 %
Neutro Abs: 6.5 10*3/uL (ref 1.7–7.7)
Neutrophils Relative %: 75 %
Platelets: 275 10*3/uL (ref 150–400)
RBC: 3.24 MIL/uL — ABNORMAL LOW (ref 3.87–5.11)
RDW: 14.6 % (ref 11.5–15.5)
WBC: 8.8 10*3/uL (ref 4.0–10.5)
nRBC: 0 % (ref 0.0–0.2)

## 2019-04-23 LAB — GLUCOSE, CAPILLARY
Glucose-Capillary: 63 mg/dL — ABNORMAL LOW (ref 70–99)
Glucose-Capillary: 72 mg/dL (ref 70–99)
Glucose-Capillary: 87 mg/dL (ref 70–99)
Glucose-Capillary: 78 mg/dL (ref 70–99)
Glucose-Capillary: 90 mg/dL (ref 70–99)

## 2019-04-23 LAB — BASIC METABOLIC PANEL
Anion gap: 15 (ref 5–15)
BUN: 49 mg/dL — ABNORMAL HIGH (ref 6–20)
CO2: 19 mmol/L — ABNORMAL LOW (ref 22–32)
Calcium: 8.1 mg/dL — ABNORMAL LOW (ref 8.9–10.3)
Chloride: 104 mmol/L (ref 98–111)
Creatinine, Ser: 11.14 mg/dL — ABNORMAL HIGH (ref 0.44–1.00)
GFR calc Af Amer: 4 mL/min — ABNORMAL LOW (ref >60)
GFR calc non Af Amer: 4 mL/min — ABNORMAL LOW (ref >60)
Glucose, Bld: 79 mg/dL (ref 70–99)
Potassium: 3.4 mmol/L — ABNORMAL LOW (ref 3.5–5.1)
Sodium: 138 mmol/L (ref 135–145)

## 2019-04-23 LAB — PATHOLOGIST SMEAR REVIEW

## 2019-04-23 LAB — MAGNESIUM: Magnesium: 2.1 mg/dL (ref 1.7–2.4)

## 2019-04-23 LAB — HEPATITIS B SURFACE ANTIGEN: Hepatitis B Surface Ag: NEGATIVE

## 2019-04-23 MED ORDER — CHLORHEXIDINE GLUCONATE CLOTH 2 % EX PADS
6.0000 | MEDICATED_PAD | Freq: Every day | CUTANEOUS | Status: DC
  Administered 2019-04-25: 05:00:00 6 via TOPICAL

## 2019-04-23 MED ORDER — VANCOMYCIN HCL IN DEXTROSE 1-5 GM/200ML-% IV SOLN
INTRAVENOUS | Status: AC
  Filled 2019-04-23: qty 200

## 2019-04-23 MED ORDER — LIDOCAINE HCL (PF) 1 % IJ SOLN: 5.0000 mL | INTRAMUSCULAR | Status: DC | PRN

## 2019-04-23 MED ORDER — SODIUM CHLORIDE 0.9 % IV SOLN: 100.0000 mL | INTRAVENOUS | Status: DC | PRN

## 2019-04-23 MED ORDER — LIDOCAINE-PRILOCAINE 2.5-2.5 % EX CREA: 1.0000 "application " | TOPICAL_CREAM | CUTANEOUS | Status: DC | PRN

## 2019-04-23 MED ORDER — MIDODRINE HCL 5 MG PO TABS
ORAL_TABLET | ORAL | Status: AC
  Filled 2019-04-23: qty 2

## 2019-04-23 MED ORDER — HEPARIN SODIUM (PORCINE) 1000 UNIT/ML DIALYSIS: 1000.0000 [IU] | INTRAMUSCULAR | Status: DC | PRN

## 2019-04-23 MED ORDER — VANCOMYCIN HCL IN DEXTROSE 1-5 GM/200ML-% IV SOLN
1000.0000 mg | Freq: Once | INTRAVENOUS | Status: AC
  Administered 2019-04-23: 1000 mg via INTRAVENOUS

## 2019-04-23 MED ORDER — PENTAFLUOROPROP-TETRAFLUOROETH EX AERO: 1.0000 "application " | INHALATION_SPRAY | CUTANEOUS | Status: DC | PRN

## 2019-04-23 MED ORDER — ALTEPLASE 2 MG IJ SOLR: 2.0000 mg | Freq: Once | INTRAMUSCULAR | Status: DC | PRN

## 2019-04-23 MED ORDER — MIDODRINE HCL 5 MG PO TABS
10.0000 mg | ORAL_TABLET | Freq: Once | ORAL | Status: AC
  Administered 2019-04-23: 10 mg via ORAL

## 2019-04-23 NOTE — Progress Notes (Signed)
Patient not returned from HD at end of shift, report given to night shift RN, Alleen Borne.

## 2019-04-23 NOTE — Progress Notes (Signed)
PROGRESS NOTE    CARRISSA TAITANO  HWE:[id number] DOB: [birth year] DOA: 04/20/2019 PCP: [name], MD   Brief Narrative:  Per HPI: [name] Wheeleris a 53 y.o.femalewith a past medical history significant for anxiety, chronic diastolic heart failure, history of fibromyalgia, type 2 diabetes mellitus chronically on insulin with nephropathy, chronic respiratory failure, morbid obesity with hypoventilation syndromeandend-stage renal disease on peritoneal dialysis;who presented to the emergency department secondary to acute development of abdominal pain (left lower quadrant), 9 out of 10 in intensity when first presented with subsequent improvement to 4-5 out of 10;associated nausea/vomiting (no blood appreciated in vomit content),nothing makes condition better or worse. Patient reports partially being able to initiate her nocturnal peritoneal dialysis treatment.Patient also expressed some ongoing constipation and decrease in her appetite. She denies any fever, chills, cough, chest pain, shortness of breath, melena, hematochezia, focal weakness, sick contacts or any other complaints.  In the ED patient was found with a low blood pressure, elevated WBCsandCT scan with some improvement in her recent fluid collection and peritonitis changes;no fever. IV fluids were given, cultures taken, IV antibiotics started. Due to ongoing low blood pressure and concern for underlying peritonitis trial hospital has been called to admit patient for further evaluation and management.   Assessment & Plan:   Principal Problem:   Abdominal pain Active Problems:   Diabetes mellitus ([place])   Morbid obesity (HCC)   HTN (hypertension)   HLD (hyperlipidemia)   Obesity hypoventilation syndrome (HCC)   Chronic diastolic CHF (congestive heart failure) (HCC)   ESRD on dialysis ([place])   Hypokalemia   Peritonitis ([place])   1-abdominal pain/Peritonitis:In a patient with history of peritoneal dialysis  -Continue with antibiotics, follow cultures, -DECLINED PD AGAIN 2/2 ABD PAIN, WHEN FLUID IS ADDED, AT REST NO PAIN, NO R/G-HD TODAY -PRN analgesics and antiemetics will be provided.  2-Diabetes mellitus ([place]) -7.1A1c -Continue low-dose Lantus nightly -contsliding scale insulin  3-Morbid obesity (HCC) -Body mass index is 47.69 kg/m. -patient reported some weight loss recently -encourage to continue following low calorie diet, portion control and increase physical activity   4-HTN (hypertension) -on presentation BP is low, still soft but improving -will cont to holdhypertensive agents -cautious fluid administration given end-stage renal disease  -Follow vital signs.  5-HLD (hyperlipidemia) -continue heat healthy diet -not using statins currently -repeat lipid panel as an outpatient   6-Obesity hypoventilation syndrome (HCC)/chronic respiratory failure -Chronically using 2 L of oxygenNC -Reports no shortness of breath -Continue oxygen supplementation.  7-Chronic diastolic CHF (congestive heart failure) (HCC) -remains stable and compensated currently -no changes, continue volume control through dialysis -Education about low-sodium diet providedand reaffirmed -Follow daily weights and strict I's and O's -Patient reported decreased oral intake for the last week and also vomiting;which increases GI losses and give concern for underlying dehydration-likely contributing to soft BP   8-ESRD on dialysis ([place]) -Nephrologyfollowing -Continue peritoneal dialysisper neph -Continue calcitriol and Rena-Vite  9-secondary hyperparathyroidism -continue the use ofFosrenol  10-Hypokalemia -Magnesium level 1.5with recheck of 2.1 -Patient received potassium and magnesium supplementation while in ED -We will follow electrolytes trend, replete  11-Anxiety -Continue as needed anxiolytics. -Mood stable currently.  DVT prophylaxis: Heparin SQ  Code Status:      Code Status Orders  (From admission, onward)         Start     Ordered   04/20/19 1217  Full code  Continuous     04/20/19 1216        Code Status History  Date Active Date Inactive Code Status Order ID Comments User Context   12/21/2018 1631 01/04/2019 2125 Full Code [id number]  Murlean Iba, MD Inpatient   06/27/2018 1614 06/30/2018 1659 Full Code [id number]  Phillips Grout, MD ED   08/30/2017 1010 08/30/2017 1522 Full Code [id number]  Clovis Riley, MD Inpatient   08/04/2017 0820 08/04/2017 1227 Full Code [id number]  Bensimhon, Shaune Pascal, MD Inpatient   07/26/2017 1124 07/26/2017 1531 Full Code [id number]  Serafina Mitchell, MD Inpatient   01/18/2017 2214 02/01/2017 1756 Full Code [id number]  Phillips Grout, MD Inpatient   12/31/2016 2234 01/06/2017 1438 Full Code [id number]  Orvan Falconer, MD Inpatient   11/25/2016 0947 11/25/2016 1330 Full Code [id number]  Conrad [place], MD Inpatient   08/12/2016 [phone] 08/15/2016 2318 Full Code [id number]  Oswald Hillock, MD ED   06/07/2016 0145 06/11/2016 1525 Full Code [id number]  Jani Gravel, MD Inpatient   12/08/2015 0502 12/10/2015 1920 Full Code [id number]  Oswald Hillock, MD Inpatient   11/13/2015 1930 12/05/2015 2241 Full Code [id number]  Samuella Cota, MD Inpatient   08/31/2012 1849 09/09/2012 1540 Full Code [id number]  Alonza Bogus, MD Inpatient   06/30/2012 1840 07/08/2012 1539 Full Code [id number]  Asencion Noble, MD Inpatient   03/08/2012 0428 03/13/2012 1535 Full Code [id number]  Anne Ng, RN Inpatient   11/11/2011 1526 11/22/2011 1631 Full Code [id number]  Angelita Ingles, RN Inpatient     Family Communication: none today  Disposition Plan:   Full Consults called: None Admission status: Inpatient   Consultants:   nephrology  Procedures:  Ct Abdomen Pelvis Wo Contrast  Result Date: 04/20/2019 CLINICAL DATA:  Initial evaluation for acute abdominal pain, shortness of breath while undergoing peritoneal dialysis. EXAM: CT ABDOMEN AND PELVIS WITHOUT  CONTRAST TECHNIQUE: Multidetector CT imaging of the abdomen and pelvis was performed following the standard protocol without IV contrast. COMPARISON:  Prior CT from 12/27/2018. FINDINGS: Lower chest: Atelectatic changes seen dependently within the visualized lung bases. Visualized lungs are otherwise clear. Hepatobiliary: Liver demonstrates a normal unenhanced appearance. Gallbladder absent. No biliary dilatation. Pancreas: Pancreas within normal limits. Spleen: Spleen within normal limits. Adrenals/Urinary Tract: Adrenal glands are normal. Kidneys equal in size without nephrolithiasis or hydronephrosis. No hydroureter. Bladder decompressed without abnormality. Stomach/Bowel: Stomach within normal limits. No evidence for bowel obstruction. No evidence for acute appendicitis. Mild colonic diverticulosis without evidence for acute diverticulitis. No acute inflammatory changes seen about the bowels. Vascular/Lymphatic: Intra-abdominal aorta of normal caliber. Mild aorto bi-iliac atherosclerotic disease. No adenopathy. Reproductive: Prior hysterectomy.  No adnexal mass. Other: Peritoneal dialysis catheter in place extending via the left lower quadrant, tip position within the left lower quadrant stable from previous. Persistent 2.2 cm collection at the entry site of the catheter, slightly decreased from previous (series 2, image 68). Persistent soft tissue stranding within the partially visualized adjacent abdominal wall extending into the pannus, suggesting persistent cellulitis/panniculitis, improved in appearance relative to previous exam. No soft tissue emphysema. Scattered free air with trace free fluid within the abdomen compatible with dialysis. Musculoskeletal: No acute osseous abnormality. No discrete lytic or blastic osseous lesions. IMPRESSION: 1. Persistent soft tissue stranding within the left anterior abdominal wall and pannus, suggesting possible cellulitis/panniculitis, improved in appearance relative to  most recent CT from 12/27/2018. Persistent 2.2 cm collection at the peritoneal dialysis catheter in treat point, slightly decreased from previous as well. 2. No other acute intra-abdominal or pelvic process. 3. Free intraperitoneal air, compatible with peritoneal  dialysis. 4. Mild colonic diverticulosis without evidence for acute diverticulitis. Electronically Signed   By: Jeannine Boga M.D.   On: 04/20/2019 04:32   Dg Abd 2 Views  Result Date: 04/23/2019 CLINICAL DATA:  Abdominal pain and vomiting. EXAM: ABDOMEN - 2 VIEW COMPARISON:  CT abdomen and pelvis 04/20/2019. Plain films of the abdomen 12/30/2018. FINDINGS: No free intraperitoneal air is identified. The bowel gas pattern is nonobstructive. Radiopaque densities projecting in the right abdomen could be retained contrast in diverticula or medicine. IMPRESSION: No acute finding. Electronically Signed   By: Inge Rise M.D.   On: 04/23/2019 12:09     Antimicrobials:   VANC DAY 4  ZOSYN DAY 3 D/C'D PER CX RESULTS      Subjective: Declined PD last PM, no pain this AM, planned HD today  Objective: Vitals:   04/23/19 0400 04/23/19 0822 04/23/19 0823 04/23/19 1311  BP: 119/60 (!) 107/58  97/63  Pulse:   83 69  Resp: _0 Temp: 97.9 F (36.6 C)  97.9 F (36.6 C) 97.6 F (36.4 C)  TempSrc: Oral  Oral Oral  SpO2: 97%  98% 99%  Weight: (!) 143.6 kg     Height:        Intake/Output Summary (Last 24 hours) at 04/23/2019 1318 Last data filed at 04/23/2019 1110 Gross per 24 hour  Intake 1370.22 ml  Output -  Net 1370.22 ml   Filed Weights   04/22/19 0500 04/22/19 1723 04/23/19 0400  Weight: (!) 142 kg (!) 143.7 kg (!) 143.6 kg    Examination:  General exam: Appears calm and comfortable  Respiratory system: Clear to auscultation. Respiratory effort normal. Cardiovascular system: S1 & S2 heard, RRR. No JVD, murmurs, rubs, gallops or clicks. No pedal edema. Gastrointestinal system: Abdomen is nondistended, soft and  nontender. Quiet bowel sounds heard. Central nervous system: Alert and oriented. No focal neurological deficits. Extremities: WWP, tarce edema Skin: No rashes, lesions or ulcers Psychiatry: Judgement and insight appear normal. Mood & affect appropriate.     Data Reviewed: I have personally reviewed following labs and imaging studies  CBC: Recent Labs  Lab 04/20/19 0229 04/20/19 1228 04/21/19 0222 04/22/19 0413 04/23/19 0522  WBC 14.8* 13.2* 11.5* 8.6 8.8  NEUTROABS 12.7*  --   --  6.5 6.5  HGB 11.0* 10.8* 10.5* 10.2* 10.4*  HCT 34.6* 33.1* 32.5* 31.8* 31.9*  MCV 99.7 97.6 98.2 98.1 98.5  PLT 316 288 280 286 033   Basic Metabolic Panel: Recent Labs  Lab 04/20/19 0229 04/20/19 1228 04/21/19 0222 04/22/19 0413 04/23/19 0522  NA 131*  --  135 137 138  K 3.0*  --  4.0 3.4* 3.4*  CL 90*  --  97* 100 104  CO2 25  --  23 20* 19*  GLUCOSE 177*  --  143* 80 79  BUN 41*  --  42* 48* 49*  CREATININE 9.49* 9.27* 9.67* 10.65* 11.14*  CALCIUM 8.1*  --  8.5* 8.4* 8.1*  MG 1.5* 1.8  --   --  2.1  PHOS  --  5.7*  --   --   --    GFR: Estimated Creatinine Clearance: 8.5 mL/min (A) (by C-G formula based on SCr of 11.14 mg/dL (H)). Liver Function Tests: Recent Labs  Lab 04/20/19 0229  AST 14*  ALT 14  ALKPHOS 136*  BILITOT 0.6  PROT 6.5  ALBUMIN 2.3*   Recent Labs  Lab 04/20/19 0229  LIPASE 24   No  results for input(s): AMMONIA in the last 168 hours. Coagulation Profile: No results for input(s): INR, PROTIME in the last 168 hours. Cardiac Enzymes: No results for input(s): CKTOTAL, CKMB, CKMBINDEX, TROPONINI in the last 168 hours. BNP (last 3 results) No results for input(s): PROBNP in the last 8760 hours. HbA1C: No results for input(s): HGBA1C in the last 72 hours. CBG: Recent Labs  Lab 04/22/19 1629 04/22/19 2102 04/23/19 0817 04/23/19 0846 04/23/19 1201  GLUCAP 112* 88 63* 72 87   Lipid Profile: No results for input(s): CHOL, HDL, LDLCALC, TRIG, CHOLHDL,  LDLDIRECT in the last 72 hours. Thyroid Function Tests: No results for input(s): TSH, T4TOTAL, FREET4, T3FREE, THYROIDAB in the last 72 hours. Anemia Panel: No results for input(s): VITAMINB12, FOLATE, FERRITIN, TIBC, IRON, RETICCTPCT in the last 72 hours. Sepsis Labs: No results for input(s): PROCALCITON, LATICACIDVEN in the last 168 hours.  Recent Results (from the past 240 hour(s))  Gram stain     Status: None   Collection Time: 04/20/19  2:30 AM  Result Value Ref Range Status   Specimen Description PERITONEAL DIALYSATE  Final   Special Requests Normal  Final   Gram Stain   Final    CYTOSPIN SMEAR WBC PRESENT, PREDOMINANTLY PMN NO ORGANISMS SEEN RESULT CALLED TO, READ BACK BY AND VERIFIED WITH: J SAPPELT,RN _0  04/20/19 [facility] Performed at [facility], [address] [place], [place] [address]    Report Status 04/20/2019 FINAL  Final  Culture, body fluid-bottle     Status: Abnormal   Collection Time: 04/20/19  2:30 AM  Result Value Ref Range Status   Specimen Description   Final    PERITONEAL DIALYSATE Performed at [facility], [address] [place], [place] [address]    Special Requests   Final    PERITONEAL DIALYSATE Performed at [facility], [address], [place], [place] [address]    Gram Stain   Final    GRAM POSITIVE COCCI ANAEROBIC BOTTLE Gram Stain Report Called to,Read Back By and Verified With: M. MUELLER _1   04/20/19 [facility] Performed at [facility], [address], [place], [place] [address]    Culture STAPHYLOCOCCUS EPIDERMIDIS (A)  Final   Report Status 04/22/2019 FINAL  Final   Organism ID, Bacteria STAPHYLOCOCCUS EPIDERMIDIS  Final      Susceptibility   Staphylococcus epidermidis - MIC*    CIPROFLOXACIN <=0.5 SENSITIVE Sensitive     ERYTHROMYCIN >=8 RESISTANT Resistant     GENTAMICIN <=0.5 SENSITIVE Sensitive     OXACILLIN >=4 RESISTANT Resistant     TETRACYCLINE 2 SENSITIVE Sensitive     VANCOMYCIN 2 SENSITIVE Sensitive     TRIMETH/SULFA <=10  SENSITIVE Sensitive     CLINDAMYCIN <=0.25 SENSITIVE Sensitive     RIFAMPIN <=0.5 SENSITIVE Sensitive     Inducible Clindamycin NEGATIVE Sensitive     * STAPHYLOCOCCUS EPIDERMIDIS  Stat Gram stain     Status: None   Collection Time: 04/21/19  1:31 AM  Result Value Ref Range Status   Specimen Description PERITONEAL FLUID  Final   Special Requests NONE  Final   Gram Stain   Final    WBC PRESENT, PREDOMINANTLY PMN NO ORGANISMS SEEN CYTOSPIN SMEAR Performed at [facility] [place] [facility] [place] [address] [place], [place] [address]    Report Status 04/21/2019 FINAL  Final  Culture, body fluid-bottle     Status: None (Preliminary result)   Collection Time: 04/21/19  1:33 AM  Result Value Ref Range Status   Specimen Description PERITONEAL  FLUID  Final   Special Requests   Final    BOTTLES DRAWN AEROBIC AND ANAEROBIC Blood Culture adequate volume   Culture   Final    NO GROWTH 2 DAYS Performed at [facility], [address], [place], [place] [address]    Report Status PENDING  Incomplete         Radiology Studies: Dg Abd 2 Views  Result Date: 04/23/2019 CLINICAL DATA:  Abdominal pain and vomiting. EXAM: ABDOMEN - 2 VIEW COMPARISON:  CT abdomen and pelvis 04/20/2019. Plain films of the abdomen 12/30/2018. FINDINGS: No free intraperitoneal air is identified. The bowel gas pattern is nonobstructive. Radiopaque densities projecting in the right abdomen could be retained contrast in diverticula or medicine. IMPRESSION: No acute finding. Electronically Signed   By: Inge Rise M.D.   On: 04/23/2019 12:09        Scheduled Meds: . calcitRIOL  0.5 mcg Oral Daily  . Chlorhexidine Gluconate Cloth  6 each Topical Q0600  . docusate sodium  100 mg Oral BID  . gentamicin cream  1 application Topical Daily  . heparin  5,000 Units Subcutaneous Q8H  . insulin aspart  0-5 Units Subcutaneous QHS  . insulin aspart  0-9 Units Subcutaneous TID WC  . insulin detemir  5 Units Subcutaneous QHS   . lactulose  30 g Oral BID  . lanthanum  1,000 mg Oral With snacks  . lanthanum  1,500 mg Oral TID WC  . multivitamin  1 tablet Oral QHS  . vancomycin variable dose per unstable renal function (pharmacist dosing)   Does not apply See admin instructions   Continuous Infusions: . dialysis solution 1.5% low-MG/low-CA    . dialysis solution 2.5% low-MG/low-CA       LOS: 3 days    Time spent: 35 min    Nicolette Bang, MD [facility]  If 7PM-7AM, please contact night-coverage  04/23/2019, 1:18 PM

## 2019-04-23 NOTE — Progress Notes (Signed)
[facility] NEPHROLOGY PROGRESS NOTE  Assessment/ Plan: Pt is a 53 y.o. yo female CHF, DM, morbid obesity, ESRD on PD follows with Dr. Lowanda Foster admitted with abdominal pain with staph epi peritonitis.  #Staph epi PD peritonitis: Unable to do dialysis yesterday with abdomen pain exacerbated by fluid inflow.  Did not receive dialysis.  Patient is now feeling some nausea and bloated.  Plan for hemodialysis via left AV fistula today.  I will order abdomen x-ray.  Reports constipation is better.  May need surgery consult evaluation.  Continue IV vancomycin.  Repeat cell count.  #ESRD on PD: We will do hemodialysis today.  Left AV fistula has good thrill and bruit.  I have discussed with the patient.  #Constipation: Reports bowel movement with lactulose.  #Hypokalemia: Monitor electrolytes.  4K bath during dialysis. DC oral KCL.  #Secondary hyperparathyroidism: Continue binders.  Monitor calcium and phosphorus level.  Subjective: Seen and examined at bedside.  Reported some nausea and difficulty with PD yesterday.  No chest pain or shortness of breath. Objective Vital signs in last 24 hours: Vitals:   04/23/19 0017 04/23/19 0400 04/23/19 0822 04/23/19 0823  BP: (!) 92/55 119/60 (!) 107/58   Pulse: 68   83  Resp: _0 Temp: 98 F (36.7 C) 97.9 F (36.6 C)  97.9 F (36.6 C)  TempSrc: Oral Oral  Oral  SpO2: 100% 97%  98%  Weight:  (!) 143.6 kg    Height:       Weight change: 1.7 kg  Intake/Output Summary (Last 24 hours) at 04/23/2019 1105 Last data filed at 04/23/2019 0618 Gross per 24 hour  Intake 1240 ml  Output -  Net 1240 ml       Labs: Basic Metabolic Panel: Recent Labs  Lab 04/20/19 1228 04/21/19 0222 04/22/19 0413 04/23/19 0522  NA  --  135 137 138  K  --  4.0 3.4* 3.4*  CL  --  97* 100 104  CO2  --  23 20* 19*  GLUCOSE  --  143* 80 79  BUN  --  42* 48* 49*  CREATININE 9.27* 9.67* 10.65* 11.14*  CALCIUM  --  8.5* 8.4* 8.1*  PHOS 5.7*  --    --   --    Liver Function Tests: Recent Labs  Lab 04/20/19 0229  AST 14*  ALT 14  ALKPHOS 136*  BILITOT 0.6  PROT 6.5  ALBUMIN 2.3*   Recent Labs  Lab 04/20/19 0229  LIPASE 24   No results for input(s): AMMONIA in the last 168 hours. CBC: Recent Labs  Lab 04/20/19 0229 04/20/19 1228 04/21/19 0222 04/22/19 0413 04/23/19 0522  WBC 14.8* 13.2* 11.5* 8.6 8.8  NEUTROABS 12.7*  --   --  6.5 6.5  HGB 11.0* 10.8* 10.5* 10.2* 10.4*  HCT 34.6* 33.1* 32.5* 31.8* 31.9*  MCV 99.7 97.6 98.2 98.1 98.5  PLT 316 288 280 286 275   Cardiac Enzymes: No results for input(s): CKTOTAL, CKMB, CKMBINDEX, TROPONINI in the last 168 hours. CBG: Recent Labs  Lab 04/22/19 1144 04/22/19 1629 04/22/19 2102 04/23/19 0817 04/23/19 0846  GLUCAP 113* 112* 88 63* 72    Iron Studies: No results for input(s): IRON, TIBC, TRANSFERRIN, FERRITIN in the last 72 hours. Studies/Results: No results found.  Medications: Infusions: . sodium chloride 75 mL/hr at 04/22/19 2221  . dialysis solution 1.5% low-MG/low-CA    . dialysis solution 2.5% low-MG/low-CA      Scheduled Medications: . calcitRIOL  0.5 mcg Oral Daily  . Chlorhexidine Gluconate Cloth  6 each Topical Q0600  . docusate sodium  100 mg Oral BID  . gentamicin cream  1 application Topical Daily  . heparin  5,000 Units Subcutaneous Q8H  . insulin aspart  0-5 Units Subcutaneous QHS  . insulin aspart  0-9 Units Subcutaneous TID WC  . insulin detemir  5 Units Subcutaneous QHS  . lactulose  30 g Oral BID  . lanthanum  1,000 mg Oral With snacks  . lanthanum  1,500 mg Oral TID WC  . multivitamin  1 tablet Oral QHS  . potassium chloride SA  40 mEq Oral Daily  . vancomycin variable dose per unstable renal function (pharmacist dosing)   Does not apply See admin instructions    have reviewed scheduled and prn medications.  Physical Exam: General:NAD, comfortable Heart:RRR, s1s2 nl, no rub Lungs:clear b/l, no color wheezing Abdomen:soft,  Non-tender, abdomen pannus Extremities: Chronic skin changes with chronic edema Dialysis Access: Left upper extremity aVF with good thrill and bruit. Neurology: Alert awake and oriented.  [name] [name] 04/23/2019,11:05 AM  LOS: 3 days

## 2019-04-23 NOTE — Progress Notes (Signed)
Patient taken for HD at 1350hrs.

## 2019-04-23 NOTE — Progress Notes (Signed)
Inpatient Diabetes Program Recommendations  AACE/ADA: New Consensus Statement on Inpatient Glycemic Control (2015)  Target Ranges:  Prepandial:   less than 140 mg/dL      Peak postprandial:   less than 180 mg/dL (1-2 hours)      Critically ill patients:  140 - 180 mg/dL   Results for [name], [name] (MRN [id number]) as of 04/23/2019 13:40  Ref. Range 04/22/2019 08:25 04/22/2019 09:30 04/22/2019 11:44 04/22/2019 16:29 04/22/2019 21:02 04/23/2019 08:17 04/23/2019 08:46 04/23/2019 12:01  Glucose-Capillary Latest Ref Range: 70 - 99 mg/dL 60 (L) 89 113 (H) 112 (H) 88 63 (L) 72 87   Review of Glycemic Control  Diabetes history: DM 2 Outpatient Diabetes medications: Levemir 10 units Daily Current orders for Inpatient glycemic control: Levemir 5 units, Novolog 0-9 units tid, Novolog 0-5 units qhs  Inpatient Diabetes Program Recommendations:    Hypoglycemia the past 2 mornings with glucose trends lower than inpatient goal. Consider decreasing Levemir to 3 units.  Thanks,  Tama Headings RN, MSN, BC-ADM Inpatient Diabetes Coordinator Team Pager [phone] (8a-5p)

## 2019-04-23 NOTE — Progress Notes (Signed)
Hypoglycemic Event  CBG: 69  Treatment:  4  0z juice and breakfast Symptoms:  none  Follow-up CBG: [id number] CBG Result: 72  Possible Reasons for Event:  Poor appetite Comments/MD notified:Dr. Tommas Olp

## 2019-04-24 LAB — BASIC METABOLIC PANEL
Anion gap: 14 (ref 5–15)
BUN: 19 mg/dL (ref 6–20)
CO2: 24 mmol/L (ref 22–32)
Calcium: 8.1 mg/dL — ABNORMAL LOW (ref 8.9–10.3)
Chloride: 98 mmol/L (ref 98–111)
Creatinine, Ser: 6.2 mg/dL — ABNORMAL HIGH (ref 0.44–1.00)
GFR calc Af Amer: 8 mL/min — ABNORMAL LOW (ref >60)
GFR calc non Af Amer: 7 mL/min — ABNORMAL LOW (ref >60)
Glucose, Bld: 90 mg/dL (ref 70–99)
Potassium: 3.7 mmol/L (ref 3.5–5.1)
Sodium: 136 mmol/L (ref 135–145)

## 2019-04-24 LAB — GLUCOSE, CAPILLARY
Glucose-Capillary: 71 mg/dL (ref 70–99)
Glucose-Capillary: 89 mg/dL (ref 70–99)
Glucose-Capillary: 132 mg/dL — ABNORMAL HIGH (ref 70–99)
Glucose-Capillary: 111 mg/dL — ABNORMAL HIGH (ref 70–99)

## 2019-04-24 LAB — CBC WITH DIFFERENTIAL/PLATELET
Abs Immature Granulocytes: 0.2 10*3/uL — ABNORMAL HIGH (ref 0.00–0.07)
Basophils Absolute: 0 10*3/uL (ref 0.0–0.1)
Basophils Relative: 1 %
Eosinophils Absolute: 0.3 10*3/uL (ref 0.0–0.5)
Eosinophils Relative: 4 %
HCT: 30.6 % — ABNORMAL LOW (ref 36.0–46.0)
Hemoglobin: 9.9 g/dL — ABNORMAL LOW (ref 12.0–15.0)
Immature Granulocytes: 3 %
Lymphocytes Relative: 15 %
Lymphs Abs: 1.1 10*3/uL (ref 0.7–4.0)
MCH: 31.7 pg (ref 26.0–34.0)
MCHC: 32.4 g/dL (ref 30.0–36.0)
MCV: 98.1 fL (ref 80.0–100.0)
Monocytes Absolute: 0.8 10*3/uL (ref 0.1–1.0)
Monocytes Relative: 10 %
Neutro Abs: 5.2 10*3/uL (ref 1.7–7.7)
Neutrophils Relative %: 67 %
Platelets: 253 10*3/uL (ref 150–400)
RBC: 3.12 MIL/uL — ABNORMAL LOW (ref 3.87–5.11)
RDW: 14.7 % (ref 11.5–15.5)
WBC: 7.6 10*3/uL (ref 4.0–10.5)
nRBC: 0.3 % — ABNORMAL HIGH (ref 0.0–0.2)

## 2019-04-24 LAB — PHOSPHORUS: Phosphorus: 3.5 mg/dL (ref 2.5–4.6)

## 2019-04-24 LAB — MRSA PCR SCREENING: MRSA by PCR: NEGATIVE

## 2019-04-24 MED ORDER — INSULIN DETEMIR 100 UNIT/ML ~~LOC~~ SOLN
3.0000 [IU] | Freq: Every day | SUBCUTANEOUS | Status: DC
  Administered 2019-04-24 – 2019-04-28 (×5): 3 [IU] via SUBCUTANEOUS
  Filled 2019-04-24 (×7): qty 0.03

## 2019-04-24 MED ORDER — MIDODRINE HCL 5 MG PO TABS
10.0000 mg | ORAL_TABLET | Freq: Two times a day (BID) | ORAL | Status: DC
  Administered 2019-04-24 – 2019-04-30 (×11): 10 mg via ORAL
  Filled 2019-04-24 (×10): qty 2

## 2019-04-24 MED ORDER — POTASSIUM CHLORIDE CRYS ER 20 MEQ PO TBCR
40.0000 meq | EXTENDED_RELEASE_TABLET | Freq: Every day | ORAL | Status: DC
  Administered 2019-04-24 – 2019-04-25 (×2): 40 meq via ORAL
  Filled 2019-04-24 (×2): qty 2

## 2019-04-24 NOTE — Care Management Important Message (Signed)
Important Message  Patient Details  Name: [name] MRN: [id number] Date of Birth: 1966/03/25   Medicare Important Message Given:  Yes    Orbie Pyo 04/24/2019, 2:01 PM

## 2019-04-24 NOTE — Progress Notes (Signed)
[facility] NEPHROLOGY PROGRESS NOTE  Assessment/ Plan: Pt is a 53 y.o. yo female CHF, DM, morbid obesity, ESRD on PD follows with Dr. Lowanda Foster admitted with abdominal pain with staph epi peritonitis.  #Staph epi PD peritonitis:  -Intermittent hemodialysis yesterday, unable to do goal ultrafiltration because of hypotensive episode.  She has good bowel movement now and now abdominal pain.  Continue vancomycin IV, dosed by pharmacy.  Check daily cell count.  Plan to do low volume dialysis tonight.  I have discussed with the patient.  Abdominal chest x-ray report reviewed.  She is not currently uremic and electrolytes are acceptable. -She has left AV fistula.  #ESRD on PD: iHD on 03/24/2019.  -left AV fistula has good thrill and bruit.   #Constipation: Reports good bowel movement with lactulose.  #Hypokalemia: Monitor electrolytes. Improved, resume KCL.   #Secondary hyperparathyroidism: Continue binders.  Monitor calcium and phosphorus level.  #Hypotension: Start midodrine twice daily.  Subjective: Seen and examined at bedside.  Tolerated iHD well yesterday.  She has no nausea vomiting or abdominal pain today.  Reports good bowel movement.  Plan to resume PD with low fill volume today.  Objective Vital signs in last 24 hours: Vitals:   04/24/19 0004 04/24/19 0544 04/24/19 0811 04/24/19 1145  BP: (!) 93/53 (!) 95/45 (!) 92/48 (!) 89/45  Pulse: 63 63 65 63  Resp: _0 Temp: 98.1 F (36.7 C) 97.8 F (36.6 C) 98.2 F (36.8 C) 98.4 F (36.9 C)  TempSrc: Oral Oral Oral Oral  SpO2: 97% 99% 99% 99%  Weight:  (!) 143.1 kg    Height:       Weight change: 0.8 kg  Intake/Output Summary (Last 24 hours) at 04/24/2019 1440 Last data filed at 04/23/2019 1835 Gross per 24 hour  Intake -  Output 800 ml  Net -800 ml       Labs: Basic Metabolic Panel: Recent Labs  Lab 04/20/19 1228  04/22/19 0413 04/23/19 0522 04/24/19 0401  NA  --    < > 137 138 136  K  --     < > 3.4* 3.4* 3.7  CL  --    < > 100 104 98  CO2  --    < > 20* 19* 24  GLUCOSE  --    < > 80 79 90  BUN  --    < > 48* 49* 19  CREATININE 9.27*   < > 10.65* 11.14* 6.20*  CALCIUM  --    < > 8.4* 8.1* 8.1*  PHOS 5.7*  --   --   --  3.5   < > = values in this interval not displayed.   Liver Function Tests: Recent Labs  Lab 04/20/19 0229  AST 14*  ALT 14  ALKPHOS 136*  BILITOT 0.6  PROT 6.5  ALBUMIN 2.3*   Recent Labs  Lab 04/20/19 0229  LIPASE 24   No results for input(s): AMMONIA in the last 168 hours. CBC: Recent Labs  Lab 04/20/19 1228 04/21/19 0222 04/22/19 0413 04/23/19 0522 04/24/19 0401  WBC 13.2* 11.5* 8.6 8.8 7.6  NEUTROABS  --   --  6.5 6.5 5.2  HGB 10.8* 10.5* 10.2* 10.4* 9.9*  HCT 33.1* 32.5* 31.8* 31.9* 30.6*  MCV 97.6 98.2 98.1 98.5 98.1  PLT 288 280 286 275 253   Cardiac Enzymes: No results for input(s): CKTOTAL, CKMB, CKMBINDEX, TROPONINI in the last 168 hours. CBG: Recent Labs  Lab 04/23/19 1201 04/23/19  2039 04/23/19 2223 04/24/19 0841 04/24/19 1141  GLUCAP 87 78 90 71 89    Iron Studies: No results for input(s): IRON, TIBC, TRANSFERRIN, FERRITIN in the last 72 hours. Studies/Results: Dg Abd 2 Views  Result Date: 04/23/2019 CLINICAL DATA:  Abdominal pain and vomiting. EXAM: ABDOMEN - 2 VIEW COMPARISON:  CT abdomen and pelvis 04/20/2019. Plain films of the abdomen 12/30/2018. FINDINGS: No free intraperitoneal air is identified. The bowel gas pattern is nonobstructive. Radiopaque densities projecting in the right abdomen could be retained contrast in diverticula or medicine. IMPRESSION: No acute finding. Electronically Signed   By: Inge Rise M.D.   On: 04/23/2019 12:09    Medications: Infusions: . dialysis solution 1.5% low-MG/low-CA    . dialysis solution 2.5% low-MG/low-CA      Scheduled Medications: . calcitRIOL  0.5 mcg Oral Daily  . Chlorhexidine Gluconate Cloth  6 each Topical Q0600  . docusate sodium  100 mg Oral BID   . gentamicin cream  1 application Topical Daily  . heparin  5,000 Units Subcutaneous Q8H  . insulin aspart  0-5 Units Subcutaneous QHS  . insulin aspart  0-9 Units Subcutaneous TID WC  . insulin detemir  3 Units Subcutaneous QHS  . lactulose  30 g Oral BID  . lanthanum  1,000 mg Oral With snacks  . lanthanum  1,500 mg Oral TID WC  . multivitamin  1 tablet Oral QHS  . vancomycin variable dose per unstable renal function (pharmacist dosing)   Does not apply See admin instructions    have reviewed scheduled and prn medications.  Physical Exam: General:NAD, comfortable Heart:RRR, s1s2 nl, no rub Lungs: Clear bilateral, no crackle or wheezing Abdomen:soft, Non-tender, abdomen pannus Extremities: Chronic skin changes with chronic edema Dialysis Access: Left upper extremity aVF with good thrill and bruit. Neurology: Alert awake and oriented.  Aarohi Redditt Prasad [name] 04/24/2019,2:40 PM  LOS: 4 days

## 2019-04-24 NOTE — Progress Notes (Signed)
PROGRESS NOTE    BLANKA ROCKHOLT  PIR:[id number] DOB: Sep 14, 1966 DOA: 04/20/2019 PCP: [name], MD   Brief Narrative:  Per HPI: Lindsay Vezina Wheeleris a 53 y.o.femalewith a past medical history significant for anxiety, chronic diastolic heart failure, history of fibromyalgia, type 2 diabetes mellitus chronically on insulin with nephropathy, chronic respiratory failure, morbid obesity with hypoventilation syndromeandend-stage renal disease on peritoneal dialysis;who presented to the emergency department secondary to acute development of abdominal pain (left lower quadrant), 9 out of 10 in intensity when first presented with subsequent improvement to 4-5 out of 10;associated nausea/vomiting (no blood appreciated in vomit content),nothing makes condition better or worse. Patient reports partially being able to initiate her nocturnal peritoneal dialysis treatment.Patient also expressed some ongoing constipation and decrease in her appetite. She denies any fever, chills, cough, chest pain, shortness of breath, melena, hematochezia, focal weakness, sick contacts or any other complaints.  In the ED patient was found with a low blood pressure, elevated WBCsandCT scan with some improvement in her recent fluid collection and peritonitis changes;no fever. IV fluids were given, cultures taken, IV antibiotics started. Due to ongoing low blood pressure and concern for underlying peritonitis trial hospital has been called to admit patient for further evaluation and management.  Since admission to the hospital patient's abdominal pain is improved, she is continue treatment for peritonitis but has a difficulty tolerating peritoneal dialysis.  She is followed by nephrology and is undergone hemodialysis for session Sunday.  Anticipate as patient's peritonitis improves they will be able  to reinitiate PD.   Assessment & Plan:   Principal Problem:   Abdominal pain Active Problems:   Diabetes  mellitus (HCC)   Morbid obesity (HCC)   HTN (hypertension)   HLD (hyperlipidemia)   Obesity hypoventilation syndrome (HCC)   Chronic diastolic CHF (congestive heart failure) (HCC)   ESRD on dialysis ([place])   Hypokalemia   Peritonitis ([place])   1-abdominal pain/Peritonitis:In a patient with history of peritoneal dialysis -Continue with antibiotics, follow cultures, -Patient's abdomen pain-free, currently declining PD and going through hemodialysis -PRN analgesics and antiemetics will be provided.  2-Diabetes mellitus ([place]) -7.1A1c -Continue low-dose Lantus nightly -contsliding scale insulin  3-Morbid obesity (HCC) -Body mass index is 47.69 kg/m. -patient reported some weight loss recently -encourage to continue following low calorie diet, portion control and increase physical activity   4-HTN (hypertension) -on presentation BP is low, still soft but improving -will cont to holdhypertensive agents -cautious fluid administration given end-stage renal disease  -Follow vital signs.  5-HLD (hyperlipidemia) -continue heat healthy diet -not using statins currently -repeat lipid panel as an outpatient   6-Obesity hypoventilation syndrome (HCC)/chronic respiratory failure -Chronically using 2 L of oxygenNC -Reports no shortness of breath -Continue oxygen supplementation.  7-Chronic diastolic CHF (congestive heart failure) (HCC) -remains stable and compensated currently -no changes, continue volume control through dialysis -Education about low-sodium diet providedand reaffirmed -Follow daily weights and strict I's and O's -Patient reported decreased oral intake for the last week and also vomiting;which increases GI losses and give concern for underlying dehydration-likely contributing to soft BP   8-ESRD on dialysis ([place]) -Nephrologyfollowing, daily lites -Continue HD until patient willing to try PD -Continue calcitriol and Rena-Vite  9-secondary  hyperparathyroidism -continue the use ofFosrenol  10-Hypokalemia -Magnesium level 1.5with recheck of 2.1 -Patient received potassium and magnesium supplementation while in ED -We will follow electrolytes trend, replete  11-Anxiety -Continue as needed anxiolytics. -Mood stable currently.  DVT prophylaxis: Heparin SQ  Code Status: Full  Code Status Orders  (From admission, onward)         Start     Ordered   04/20/19 1217  Full code  Continuous     04/20/19 1216        Code Status History    Date Active Date Inactive Code Status Order ID Comments User Context   12/21/2018 1631 01/04/2019 2125 Full Code [id number]  Murlean Iba, MD Inpatient   06/27/2018 1614 06/30/2018 1659 Full Code [id number]  Phillips Grout, MD ED   08/30/2017 1010 08/30/2017 1522 Full Code [id number]  Clovis Riley, MD Inpatient   08/04/2017 0820 08/04/2017 1227 Full Code [id number]  Bensimhon, Shaune Pascal, MD Inpatient   07/26/2017 1124 07/26/2017 1531 Full Code [id number]  Serafina Mitchell, MD Inpatient   01/18/2017 2214 02/01/2017 1756 Full Code [id number]  Phillips Grout, MD Inpatient   12/31/2016 2234 01/06/2017 1438 Full Code [id number]  Orvan Falconer, MD Inpatient   11/25/2016 0947 11/25/2016 1330 Full Code [id number]  Conrad Los Veteranos II, MD Inpatient   08/12/2016 0623 08/15/2016 2318 Full Code [id number]  Oswald Hillock, MD ED   06/07/2016 0145 06/11/2016 1525 Full Code [id number]  Jani Gravel, MD Inpatient   12/08/2015 0502 12/10/2015 1920 Full Code [id number]  Oswald Hillock, MD Inpatient   11/13/2015 1930 12/05/2015 2241 Full Code [id number]  Samuella Cota, MD Inpatient   08/31/2012 1849 09/09/2012 1540 Full Code [id number]  Alonza Bogus, MD Inpatient   06/30/2012 1840 07/08/2012 1539 Full Code [id number]  Asencion Noble, MD Inpatient   03/08/2012 0428 03/13/2012 1535 Full Code [id number]  Anne Ng, RN Inpatient   11/11/2011 1526 11/22/2011 1631 Full Code [id number]  Angelita Ingles, RN Inpatient     Family Communication:  With husband by phone Disposition Plan:   Patient will remain inpatient for continued hemodialysis for electrolyte disarray, and IV antibiotics for treatment of peritonitis.  Without these treatments patient at risk of arrhythmias and volume overload and multiorgan system failure. Consults called: None Admission status: Inpatient   Consultants:   nephrology  Procedures:  Ct Abdomen Pelvis Wo Contrast  Result Date: 04/20/2019 CLINICAL DATA:  Initial evaluation for acute abdominal pain, shortness of breath while undergoing peritoneal dialysis. EXAM: CT ABDOMEN AND PELVIS WITHOUT CONTRAST TECHNIQUE: Multidetector CT imaging of the abdomen and pelvis was performed following the standard protocol without IV contrast. COMPARISON:  Prior CT from 12/27/2018. FINDINGS: Lower chest: Atelectatic changes seen dependently within the visualized lung bases. Visualized lungs are otherwise clear. Hepatobiliary: Liver demonstrates a normal unenhanced appearance. Gallbladder absent. No biliary dilatation. Pancreas: Pancreas within normal limits. Spleen: Spleen within normal limits. Adrenals/Urinary Tract: Adrenal glands are normal. Kidneys equal in size without nephrolithiasis or hydronephrosis. No hydroureter. Bladder decompressed without abnormality. Stomach/Bowel: Stomach within normal limits. No evidence for bowel obstruction. No evidence for acute appendicitis. Mild colonic diverticulosis without evidence for acute diverticulitis. No acute inflammatory changes seen about the bowels. Vascular/Lymphatic: Intra-abdominal aorta of normal caliber. Mild aorto bi-iliac atherosclerotic disease. No adenopathy. Reproductive: Prior hysterectomy.  No adnexal mass. Other: Peritoneal dialysis catheter in place extending via the left lower quadrant, tip position within the left lower quadrant stable from previous. Persistent 2.2 cm collection at the entry site of the catheter, slightly decreased from previous (series 2, image 68).  Persistent soft tissue stranding within the partially visualized adjacent abdominal wall extending into the pannus, suggesting persistent cellulitis/panniculitis, improved in appearance relative to previous exam. No soft tissue emphysema. Scattered free air  with trace free fluid within the abdomen compatible with dialysis. Musculoskeletal: No acute osseous abnormality. No discrete lytic or blastic osseous lesions. IMPRESSION: 1. Persistent soft tissue stranding within the left anterior abdominal wall and pannus, suggesting possible cellulitis/panniculitis, improved in appearance relative to most recent CT from 12/27/2018. Persistent 2.2 cm collection at the peritoneal dialysis catheter in treat point, slightly decreased from previous as well. 2. No other acute intra-abdominal or pelvic process. 3. Free intraperitoneal air, compatible with peritoneal dialysis. 4. Mild colonic diverticulosis without evidence for acute diverticulitis. Electronically Signed   By: Jeannine Boga M.D.   On: 04/20/2019 04:32   Dg Abd 2 Views  Result Date: 04/23/2019 CLINICAL DATA:  Abdominal pain and vomiting. EXAM: ABDOMEN - 2 VIEW COMPARISON:  CT abdomen and pelvis 04/20/2019. Plain films of the abdomen 12/30/2018. FINDINGS: No free intraperitoneal air is identified. The bowel gas pattern is nonobstructive. Radiopaque densities projecting in the right abdomen could be retained contrast in diverticula or medicine. IMPRESSION: No acute finding. Electronically Signed   By: Inge Rise M.D.   On: 04/23/2019 12:09     Antimicrobials:   VANC DAY 5  ZOSYN DAY 3 D/C'D PER CX RESULTS      Subjective: Patient reports abdominal pain is resolved none on exam and none at rest Still reports feeling tired and weak, blood pressure remains soft  Objective: Vitals:   04/24/19 0004 04/24/19 0544 04/24/19 0811 04/24/19 1145  BP: (!) 93/53 (!) 95/45 (!) 92/48 (!) 89/45  Pulse: 63 63 65 63  Resp: _0 Temp: 98.1 F  (36.7 C) 97.8 F (36.6 C) 98.2 F (36.8 C) 98.4 F (36.9 C)  TempSrc: Oral Oral Oral Oral  SpO2: 97% 99% 99% 99%  Weight:  (!) 143.1 kg    Height:        Intake/Output Summary (Last 24 hours) at 04/24/2019 1441 Last data filed at 04/23/2019 1835 Gross per 24 hour  Intake --  Output 800 ml  Net -800 ml   Filed Weights   04/23/19 1355 04/23/19 1835 04/24/19 0544  Weight: (!) 144.5 kg (!) 143 kg (!) 143.1 kg    Examination:  General exam: Appears calm and comfortable  Respiratory system: Clear to auscultation. Respiratory effort normal. Cardiovascular system: S1 & S2 heard, RRR. No JVD, murmurs, rubs, gallops or clicks. No pedal edema. Gastrointestinal system: Abdomen is nondistended, soft and only mildly tender with deep palpation otherwise benign. Normal bowel sounds heard. Central nervous system: Alert and oriented. No focal neurological deficits. Extremities: Warm well perfused, trace edema Skin: No rashes, lesions or ulcers Psychiatry: Judgement and insight appear normal. Mood & affect appropriate.     Data Reviewed: I have personally reviewed following labs and imaging studies  CBC: Recent Labs  Lab 04/20/19 0229 04/20/19 1228 04/21/19 0222 04/22/19 0413 04/23/19 0522 04/24/19 0401  WBC 14.8* 13.2* 11.5* 8.6 8.8 7.6  NEUTROABS 12.7*  --   --  6.5 6.5 5.2  HGB 11.0* 10.8* 10.5* 10.2* 10.4* 9.9*  HCT 34.6* 33.1* 32.5* 31.8* 31.9* 30.6*  MCV 99.7 97.6 98.2 98.1 98.5 98.1  PLT 316 288 280 286 275 090   Basic Metabolic Panel: Recent Labs  Lab 04/20/19 0229 04/20/19 1228 04/21/19 0222 04/22/19 0413 04/23/19 0522 04/24/19 0401  NA 131*  --  135 137 138 136  K 3.0*  --  4.0 3.4* 3.4* 3.7  CL 90*  --  97* 100 104 98  CO2 25  --  23  20* 19* 24  GLUCOSE 177*  --  143* 80 79 90  BUN 41*  --  42* 48* 49* 19  CREATININE 9.49* 9.27* 9.67* 10.65* 11.14* 6.20*  CALCIUM 8.1*  --  8.5* 8.4* 8.1* 8.1*  MG 1.5* 1.8  --   --  2.1  --   PHOS  --  5.7*  --   --   --   3.5   GFR: Estimated Creatinine Clearance: 15.3 mL/min (A) (by C-G formula based on SCr of 6.2 mg/dL (H)). Liver Function Tests: Recent Labs  Lab 04/20/19 0229  AST 14*  ALT 14  ALKPHOS 136*  BILITOT 0.6  PROT 6.5  ALBUMIN 2.3*   Recent Labs  Lab 04/20/19 0229  LIPASE 24   No results for input(s): AMMONIA in the last 168 hours. Coagulation Profile: No results for input(s): INR, PROTIME in the last 168 hours. Cardiac Enzymes: No results for input(s): CKTOTAL, CKMB, CKMBINDEX, TROPONINI in the last 168 hours. BNP (last 3 results) No results for input(s): PROBNP in the last 8760 hours. HbA1C: No results for input(s): HGBA1C in the last 72 hours. CBG: Recent Labs  Lab 04/23/19 1201 04/23/19 2039 04/23/19 2223 04/24/19 0841 04/24/19 1141  GLUCAP 87 78 90 71 89   Lipid Profile: No results for input(s): CHOL, HDL, LDLCALC, TRIG, CHOLHDL, LDLDIRECT in the last 72 hours. Thyroid Function Tests: No results for input(s): TSH, T4TOTAL, FREET4, T3FREE, THYROIDAB in the last 72 hours. Anemia Panel: No results for input(s): VITAMINB12, FOLATE, FERRITIN, TIBC, IRON, RETICCTPCT in the last 72 hours. Sepsis Labs: No results for input(s): PROCALCITON, LATICACIDVEN in the last 168 hours.  Recent Results (from the past 240 hour(s))  Gram stain     Status: None   Collection Time: 04/20/19  2:30 AM  Result Value Ref Range Status   Specimen Description PERITONEAL DIALYSATE  Final   Special Requests Normal  Final   Gram Stain   Final    CYTOSPIN SMEAR WBC PRESENT, PREDOMINANTLY PMN NO ORGANISMS SEEN RESULT CALLED TO, READ BACK BY AND VERIFIED WITH: J SAPPELT,RN _0  04/20/19 [facility] Performed at [facility], [address] [place], [place] [address]    Report Status 04/20/2019 FINAL  Final  Culture, body fluid-bottle     Status: Abnormal   Collection Time: 04/20/19  2:30 AM  Result Value Ref Range Status   Specimen Description   Final    PERITONEAL DIALYSATE Performed at  [facility], [address] [place], [place] [address]    Special Requests   Final    PERITONEAL DIALYSATE Performed at [facility], [address], [place], [place] [address]    Gram Stain   Final    GRAM POSITIVE COCCI ANAEROBIC BOTTLE Gram Stain Report Called to,Read Back By and Verified With: M. MUELLER _1   04/20/19 [facility] Performed at [facility], [address], [place],  [address]    Culture STAPHYLOCOCCUS EPIDERMIDIS (A)  Final   Report Status 04/22/2019 FINAL  Final   Organism ID, Bacteria STAPHYLOCOCCUS EPIDERMIDIS  Final      Susceptibility   Staphylococcus epidermidis - MIC*    CIPROFLOXACIN <=0.5 SENSITIVE Sensitive     ERYTHROMYCIN >=8 RESISTANT Resistant     GENTAMICIN <=0.5 SENSITIVE Sensitive     OXACILLIN >=4 RESISTANT Resistant     TETRACYCLINE 2 SENSITIVE Sensitive     VANCOMYCIN 2 SENSITIVE Sensitive     TRIMETH/SULFA <=10 SENSITIVE Sensitive     CLINDAMYCIN <=0.25 SENSITIVE Sensitive  RIFAMPIN <=0.5 SENSITIVE Sensitive     Inducible Clindamycin NEGATIVE Sensitive     * STAPHYLOCOCCUS EPIDERMIDIS  Stat Gram stain     Status: None   Collection Time: 04/21/19  1:31 AM  Result Value Ref Range Status   Specimen Description PERITONEAL FLUID  Final   Special Requests NONE  Final   Gram Stain   Final    WBC PRESENT, PREDOMINANTLY PMN NO ORGANISMS SEEN CYTOSPIN SMEAR Performed at [facility], [place] [address] [place], [place] [address]    Report Status 04/21/2019 FINAL  Final  Culture, body fluid-bottle     Status: None (Preliminary result)   Collection Time: 04/21/19  1:33 AM  Result Value Ref Range Status   Specimen Description PERITONEAL FLUID  Final   Special Requests   Final    BOTTLES DRAWN AEROBIC AND ANAEROBIC Blood Culture adequate volume   Culture   Final    NO GROWTH 3 DAYS Performed at [facility], [place] [address], [place], [place] [address]    Report Status PENDING  Incomplete         Radiology Studies: Dg Abd 2  Views  Result Date: 04/23/2019 CLINICAL DATA:  Abdominal pain and vomiting. EXAM: ABDOMEN - 2 VIEW COMPARISON:  CT abdomen and pelvis 04/20/2019. Plain films of the abdomen 12/30/2018. FINDINGS: No free intraperitoneal air is identified. The bowel gas pattern is nonobstructive. Radiopaque densities projecting in the right abdomen could be retained contrast in diverticula or medicine. IMPRESSION: No acute finding. Electronically Signed   By: Inge Rise M.D.   On: 04/23/2019 12:09        Scheduled Meds:  calcitRIOL  0.5 mcg Oral Daily   Chlorhexidine Gluconate Cloth  6 each Topical Q0600   docusate sodium  100 mg Oral BID   gentamicin cream  1 application Topical Daily   heparin  5,000 Units Subcutaneous Q8H   insulin aspart  0-5 Units Subcutaneous QHS   insulin aspart  0-9 Units Subcutaneous TID WC   insulin detemir  3 Units Subcutaneous QHS   lactulose  30 g Oral BID   lanthanum  1,000 mg Oral With snacks   lanthanum  1,500 mg Oral TID WC   multivitamin  1 tablet Oral QHS   vancomycin variable dose per unstable renal function (pharmacist dosing)   Does not apply See admin instructions   Continuous Infusions:  dialysis solution 1.5% low-MG/low-CA     dialysis solution 2.5% low-MG/low-CA       LOS: 4 days    Time spent: 35 min    Nicolette Bang, MD [facility]  If 7PM-7AM, please contact night-coverage  04/24/2019, 2:41 PM

## 2019-04-25 DIAGNOSIS — R1030 Lower abdominal pain, unspecified: Secondary | ICD-10-CM

## 2019-04-25 DIAGNOSIS — D631 Anemia in chronic kidney disease: Secondary | ICD-10-CM

## 2019-04-25 DIAGNOSIS — N189 Chronic kidney disease, unspecified: Secondary | ICD-10-CM

## 2019-04-25 LAB — RENAL FUNCTION PANEL
Albumin: 1.8 g/dL — ABNORMAL LOW (ref 3.5–5.0)
Anion gap: 15 (ref 5–15)
BUN: 26 mg/dL — ABNORMAL HIGH (ref 6–20)
CO2: 25 mmol/L (ref 22–32)
Calcium: 8.8 mg/dL — ABNORMAL LOW (ref 8.9–10.3)
Chloride: 98 mmol/L (ref 98–111)
Creatinine, Ser: 7.84 mg/dL — ABNORMAL HIGH (ref 0.44–1.00)
GFR calc Af Amer: 6 mL/min — ABNORMAL LOW (ref >60)
GFR calc non Af Amer: 5 mL/min — ABNORMAL LOW (ref >60)
Glucose, Bld: 100 mg/dL — ABNORMAL HIGH (ref 70–99)
Phosphorus: 4.4 mg/dL (ref 2.5–4.6)
Potassium: 3.8 mmol/L (ref 3.5–5.1)
Sodium: 138 mmol/L (ref 135–145)

## 2019-04-25 LAB — CBC WITH DIFFERENTIAL/PLATELET
Abs Immature Granulocytes: 0.27 10*3/uL — ABNORMAL HIGH (ref 0.00–0.07)
Basophils Absolute: 0.1 10*3/uL (ref 0.0–0.1)
Basophils Relative: 1 %
Eosinophils Absolute: 0.3 10*3/uL (ref 0.0–0.5)
Eosinophils Relative: 3 %
HCT: 29.3 % — ABNORMAL LOW (ref 36.0–46.0)
Hemoglobin: 9.2 g/dL — ABNORMAL LOW (ref 12.0–15.0)
Immature Granulocytes: 3 %
Lymphocytes Relative: 20 %
Lymphs Abs: 1.7 10*3/uL (ref 0.7–4.0)
MCH: 31 pg (ref 26.0–34.0)
MCHC: 31.4 g/dL (ref 30.0–36.0)
MCV: 98.7 fL (ref 80.0–100.0)
Monocytes Absolute: 0.8 10*3/uL (ref 0.1–1.0)
Monocytes Relative: 9 %
Neutro Abs: 5.2 10*3/uL (ref 1.7–7.7)
Neutrophils Relative %: 64 %
Platelets: 247 10*3/uL (ref 150–400)
RBC: 2.97 MIL/uL — ABNORMAL LOW (ref 3.87–5.11)
RDW: 14.7 % (ref 11.5–15.5)
WBC: 8.2 10*3/uL (ref 4.0–10.5)
nRBC: 0 % (ref 0.0–0.2)

## 2019-04-25 LAB — VANCOMYCIN, RANDOM: Vancomycin Rm: 26

## 2019-04-25 LAB — BODY FLUID CELL COUNT WITH DIFFERENTIAL
Eos, Fluid: 8 %
Lymphs, Fluid: 20 %
Monocyte-Macrophage-Serous Fluid: 7 % — ABNORMAL LOW (ref 50–90)
Neutrophil Count, Fluid: 65 % — ABNORMAL HIGH (ref 0–25)
Total Nucleated Cell Count, Fluid: 18 cu mm (ref 0–1000)

## 2019-04-25 LAB — PATHOLOGIST SMEAR REVIEW

## 2019-04-25 LAB — GLUCOSE, CAPILLARY
Glucose-Capillary: 91 mg/dL (ref 70–99)
Glucose-Capillary: 81 mg/dL (ref 70–99)
Glucose-Capillary: 125 mg/dL — ABNORMAL HIGH (ref 70–99)

## 2019-04-25 MED ORDER — SODIUM CHLORIDE 0.9 % IV SOLN: 100.0000 mL | INTRAVENOUS | Status: DC | PRN

## 2019-04-25 MED ORDER — LIDOCAINE-PRILOCAINE 2.5-2.5 % EX CREA
1.0000 "application " | TOPICAL_CREAM | CUTANEOUS | Status: DC | PRN
  Filled 2019-04-25: qty 5

## 2019-04-25 MED ORDER — CHLORHEXIDINE GLUCONATE CLOTH 2 % EX PADS
6.0000 | MEDICATED_PAD | Freq: Once | CUTANEOUS | Status: AC
  Administered 2019-04-26: 06:00:00 6 via TOPICAL

## 2019-04-25 MED ORDER — PENTAFLUOROPROP-TETRAFLUOROETH EX AERO: 1.0000 "application " | INHALATION_SPRAY | CUTANEOUS | Status: DC | PRN

## 2019-04-25 MED ORDER — CHLORHEXIDINE GLUCONATE CLOTH 2 % EX PADS: 6.0000 | MEDICATED_PAD | Freq: Every day | CUTANEOUS | Status: DC

## 2019-04-25 MED ORDER — SODIUM CHLORIDE 0.9 % IV SOLN
100.0000 mL | INTRAVENOUS | Status: DC | PRN
  Administered 2019-04-26 (×2): via INTRAVENOUS

## 2019-04-25 MED ORDER — LIDOCAINE HCL (PF) 1 % IJ SOLN: 5.0000 mL | INTRAMUSCULAR | Status: DC | PRN

## 2019-04-25 MED ORDER — CHLORHEXIDINE GLUCONATE CLOTH 2 % EX PADS
6.0000 | MEDICATED_PAD | Freq: Once | CUTANEOUS | Status: AC
  Administered 2019-04-26: 6 via TOPICAL

## 2019-04-25 NOTE — Progress Notes (Signed)
Pharmacy Antibiotic Note  [name] is a 53 y.o. female admitted on 04/20/2019 with abdominal pain and noted with a history of ESRD with peritoneal HD.   Pharmacy has been consulted for vancomycin dosing for GPC peritonitis. Patient has been intermittently receiving PD or HD. Due for another HD session today. Vancomycin random level is supratherapeutic at 26. If patient tolerates a full dialysis session today and normal BFR, the vancomycin level will fall to a therapeutic level.   Plan: -Hold off re-dosing vancomycin today -F/u HD schedule   Height: _0  (165.1 cm) Weight: (!) 313 lb 11.4 oz (142.3 kg) IBW/kg (Calculated) : 57  Temp (24hrs), Avg:98.3 F (36.8 C), Min:97.6 F (36.4 C), Max:98.7 F (37.1 C)  Recent Labs  Lab 04/21/19 0222 04/22/19 0413 04/23/19 0522 04/24/19 0401 04/25/19 0352  WBC 11.5* 8.6 8.8 7.6 8.2  CREATININE 9.67* 10.65* 11.14* 6.20* 7.84*  VANCORANDOM  --   --   --   --  26     Antimicrobials this admission: 5/1 vanc>>  Microbiology results: 5/1- peritoneal dialysate   Harvel Quale 04/25/2019 12:56 PM

## 2019-04-25 NOTE — Progress Notes (Addendum)
[facility] NEPHROLOGY PROGRESS NOTE  Assessment/ Plan: Pt is a 53 y.o. yo female CHF, DM, morbid obesity, ESRD on PD follows with Dr. Lowanda Foster admitted with abdominal pain with staph epi peritonitis.  #Staph epi PD peritonitis:  -Patient had pain with even low volume PD yesterday.  Continue IV vancomycin.  The cell counts improving.  Given persistence of abdominal pain and CT scan finding of persistent 2.2 cm collection at the entry site of catheter and some surrounding tenderness,  I consulted general surgery for the evaluation.  Patient reports good bowel movement with stool softener. -meantime we''ll do HD.  #ESRD on PD: iHD on /03/2019.  Plan for another hemodialysis today. -left AV fistula has good thrill and bruit.   #Constipation: Reports good bowel movement with lactulose.  #Hypokalemia: Monitor electrolytes. Improved, resume KCL.   #Secondary hyperparathyroidism: Continue binders.  Monitor calcium and phosphorus level.  #Hypotension: Start midodrine twice daily.  Subjective: Seen and examined at bedside.  Unable to tolerate PD with inflow pain.  Mild shortness of breath but no chest pain, nausea or vomiting.  Currently no abdominal pain.  Objective Vital signs in last 24 hours: Vitals:   04/24/19 2320 04/25/19 0030 04/25/19 0530 04/25/19 0805  BP: (!) 105/57 (!) 106/52 (!) 110/53 (!) 107/55  Pulse: 67 66 76 (!) 59  Resp: _0 Temp: 98.7 F (37.1 C) 98.1 F (36.7 C) 97.6 F (36.4 C) 98.2 F (36.8 C)  TempSrc: Oral Oral  Oral  SpO2: 98% 97% 99% 100%  Weight: (!) 144.3 kg  (!) 143.7 kg   Height:       Weight change: -0.8 kg  Intake/Output Summary (Last 24 hours) at 04/25/2019 1052 Last data filed at 04/25/2019 6916 Gross per 24 hour  Intake 4723 ml  Output -  Net 4723 ml       Labs: Basic Metabolic Panel: Recent Labs  Lab 04/20/19 1228  04/23/19 0522 04/24/19 0401 04/25/19 0352  NA  --    < > 138 136 138  K  --    < > 3.4* 3.7 3.8   CL  --    < > 104 98 98  CO2  --    < > 19* 24 25  GLUCOSE  --    < > 79 90 100*  BUN  --    < > 49* 19 26*  CREATININE 9.27*   < > 11.14* 6.20* 7.84*  CALCIUM  --    < > 8.1* 8.1* 8.8*  PHOS 5.7*  --   --  3.5 4.4   < > = values in this interval not displayed.   Liver Function Tests: Recent Labs  Lab 04/20/19 0229 04/25/19 0352  AST 14*  --   ALT 14  --   ALKPHOS 136*  --   BILITOT 0.6  --   PROT 6.5  --   ALBUMIN 2.3* 1.8*   Recent Labs  Lab 04/20/19 0229  LIPASE 24   No results for input(s): AMMONIA in the last 168 hours. CBC: Recent Labs  Lab 04/21/19 0222 04/22/19 0413 04/23/19 0522 04/24/19 0401 04/25/19 0352  WBC 11.5* 8.6 8.8 7.6 8.2  NEUTROABS  --  6.5 6.5 5.2 5.2  HGB 10.5* 10.2* 10.4* 9.9* 9.2*  HCT 32.5* 31.8* 31.9* 30.6* 29.3*  MCV 98.2 98.1 98.5 98.1 98.7  PLT 280 286 275 253 247   Cardiac Enzymes: No results for input(s): CKTOTAL, CKMB, CKMBINDEX, TROPONINI in the last  168 hours. CBG: Recent Labs  Lab 04/24/19 0841 04/24/19 1141 04/24/19 1630 04/24/19 2124 04/25/19 0730  GLUCAP 71 89 132* 111* 91    Iron Studies: No results for input(s): IRON, TIBC, TRANSFERRIN, FERRITIN in the last 72 hours. Studies/Results: Dg Abd 2 Views  Result Date: 04/23/2019 CLINICAL DATA:  Abdominal pain and vomiting. EXAM: ABDOMEN - 2 VIEW COMPARISON:  CT abdomen and pelvis 04/20/2019. Plain films of the abdomen 12/30/2018. FINDINGS: No free intraperitoneal air is identified. The bowel gas pattern is nonobstructive. Radiopaque densities projecting in the right abdomen could be retained contrast in diverticula or medicine. IMPRESSION: No acute finding. Electronically Signed   By: Inge Rise M.D.   On: 04/23/2019 12:09    Medications: Infusions: . dialysis solution 1.5% low-MG/low-CA    . dialysis solution 2.5% low-MG/low-CA      Scheduled Medications: . calcitRIOL  0.5 mcg Oral Daily  . Chlorhexidine Gluconate Cloth  6 each Topical Q0600  .  Chlorhexidine Gluconate Cloth  6 each Topical Q0600  . docusate sodium  100 mg Oral BID  . gentamicin cream  1 application Topical Daily  . heparin  5,000 Units Subcutaneous Q8H  . insulin aspart  0-5 Units Subcutaneous QHS  . insulin aspart  0-9 Units Subcutaneous TID WC  . insulin detemir  3 Units Subcutaneous QHS  . lactulose  30 g Oral BID  . lanthanum  1,000 mg Oral With snacks  . lanthanum  1,500 mg Oral TID WC  . midodrine  10 mg Oral BID WC  . multivitamin  1 tablet Oral QHS  . potassium chloride  40 mEq Oral Daily  . vancomycin variable dose per unstable renal function (pharmacist dosing)   Does not apply See admin instructions    have reviewed scheduled and prn medications.  Physical Exam: General:NAD, comfortable Heart:RRR, s1s2 nl, no rub Lungs: Clear bilateral, no crackles or wheezing Abdomen:soft, some tenderness around the catheter exit site, no discharge, abdomen pannus Extremities: Chronic skin changes with chronic edema Dialysis Access: Left upper extremity aVF with good thrill and bruit. Neurology: Alert awake and oriented.  Jary Louvier Prasad Damiano Stamper 04/25/2019,10:52 AM  LOS: 5 days

## 2019-04-25 NOTE — Progress Notes (Signed)
[facility]                                                                              Patient Demographics  [name], is a 53 y.o. female, DOB - 1966/11/16, ENM:[id number]  Admit date - 04/20/2019   Admitting Physician Marcell Anger, MD  Outpatient Primary MD for the patient is [name], MD  Outpatient specialists:   LOS - 5  days   Medical records reviewed and are as summarized below:    Chief Complaint  Patient presents with   Abdominal Pain       Brief summary    Patient is a 53 year old female with anxiety, chronic diastolic heart failure, history of fibromyalgia, type 2 diabetes mellitus chronically on insulin with nephropathy, chronic respiratory failure, morbid obesity with hypoventilation syndromeandend-stage renal disease on peritoneal dialysis;who presented to the emergency department secondary to acute development of severe abdominal pain (left lower quadrant), 9/10 with associated nausea and vomiting. Patient reports partially being able to initiate her nocturnal peritoneal dialysis treatment.Patient also expressed some ongoing constipation and decrease in her appetite. In the ED patient was found with a low blood pressure, elevated WBCsandCT scan with some improvement in her recent fluid collection and peritonitis changes;no fever. IV fluids were given, cultures taken, IV antibiotics started. Patient was admitted for further work-up.  Assessment & Plan    Principal Problem: Abdominal pain with peritonitis in the setting of peritoneal dialysis/ESRD -PD cultures shows staph epidermidis -Continue IV vancomycin -Given persistence of abdominal pain, CT scan was obtained which showed persistent 2.2 cm collection at the entry site of catheter and surrounding tenderness, general surgery consulted -Continue hemodialysis for now  ESRD on PD -Currently undergoing hemodialysis  Constipation -Improved with  lactulose   Diabetes mellitus type 2 with renal complication -Hemoglobin A1c 7.1, continue sliding scale insulin, Lantus.  CBGs controlled   Morbid obesity BMI 47, recommended continue exercise, diet control  Essential hypertension -On presentation BP low likely due to peritonitis now improving -On HD, continue to hold antihypertensives  Hyperlipidemia -Not on statins currently  History of chronic respiratory failure, OHS -Currently stable, no shortness of breath, chronically on 2 L O2 at home   Chronic diastolic CHF -Currently stable, continue HD for volume management  Anxiety -Currently stable, no acute issues  Code Status: Full code DVT Prophylaxis: Heparin subcu Family Communication: Discussed in detail with the patient, all imaging results, lab results explained to the patient    Disposition Plan: Once cleared by nephrology.  Currently high risk of decompensation with ongoing peritonitis  Time Spent in minutes   35 minutes  Procedures:  Hemodialysis  Consultants:   Nephrology General surgery  Antimicrobials:   Anti-infectives (From admission, onward)   Start     Dose/Rate Route Frequency Ordered Stop   04/23/19 1700  vancomycin (VANCOCIN) IVPB 1000 mg/200 mL premix     1,000 mg 200 mL/hr over 60 Minutes Intravenous  Once 04/23/19 1437 04/23/19 1925   04/21/19 2200  piperacillin-tazobactam (ZOSYN) IVPB 2.25 g  Status:  Discontinued     2.25 g 100 mL/hr over 30 Minutes Intravenous Every 12 hours  04/21/19 1127 04/22/19 1137   04/20/19 1730  vancomycin (VANCOCIN) 2,500 mg in sodium chloride 0.9 % 500 mL IVPB     2,500 mg 250 mL/hr over 120 Minutes Intravenous  Once 04/20/19 1633 04/20/19 1927   04/20/19 1645  vancomycin variable dose per unstable renal function (pharmacist dosing)      Does not apply See admin instructions 04/20/19 1645     04/20/19 1000  piperacillin-tazobactam (ZOSYN) IVPB 3.375 g  Status:  Discontinued     3.375 g 12.5 mL/hr over 240  Minutes Intravenous Every 12 hours 04/20/19 0911 04/21/19 1127   04/20/19 0500  ceFEPIme (MAXIPIME) 2 g in sodium chloride 0.9 % 100 mL IVPB     2 g 200 mL/hr over 30 Minutes Intravenous  Once 04/20/19 0447 04/20/19 9211          Medications  Scheduled Meds:  calcitRIOL  0.5 mcg Oral Daily   Chlorhexidine Gluconate Cloth  6 each Topical Q0600   Chlorhexidine Gluconate Cloth  6 each Topical Once   And   Chlorhexidine Gluconate Cloth  6 each Topical Once   docusate sodium  100 mg Oral BID   gentamicin cream  1 application Topical Daily   heparin  5,000 Units Subcutaneous Q8H   insulin aspart  0-5 Units Subcutaneous QHS   insulin aspart  0-9 Units Subcutaneous TID WC   insulin detemir  3 Units Subcutaneous QHS   lactulose  30 g Oral BID   lanthanum  1,000 mg Oral With snacks   lanthanum  1,500 mg Oral TID WC   midodrine  10 mg Oral BID WC   multivitamin  1 tablet Oral QHS   potassium chloride  40 mEq Oral Daily   vancomycin variable dose per unstable renal function (pharmacist dosing)   Does not apply See admin instructions   Continuous Infusions:  dialysis solution 1.5% low-MG/low-CA     dialysis solution 2.5% low-MG/low-CA     PRN Meds:.acetaminophen, albuterol, ALPRAZolam, dianeal solution for CAPD/CCPD with heparin, dianeal solution for CAPD/CCPD with heparin, ondansetron **OR** ondansetron (ZOFRAN) IV      Subjective:   [name] was seen and examined today.  States feels abdominal pain with PD however cannot tolerate hemodialysis.  No acute issues at this time.  Area around the PD catheter indurated and tender. Patient denies dizziness, chest pain, shortness of breath, No acute events overnight.    Objective:   Vitals:   04/25/19 1430 04/25/19 1500 04/25/19 1530 04/25/19 1539  BP: (!) 89/50 122/85 (!) 89/53 (!) 90/47  Pulse: 61 (!) 54 62 68  Resp:    16  Temp:    98.5 F (36.9 C)  TempSrc:    Oral  SpO2:    100%  Weight:    (!)  140.1 kg  Height:        Intake/Output Summary (Last 24 hours) at 04/25/2019 1621 Last data filed at 04/25/2019 1539 Gross per 24 hour  Intake 4483 ml  Output 1700 ml  Net 2783 ml     Wt Readings from Last 3 Encounters:  04/25/19 (!) 140.1 kg  01/04/19 (!) 158.3 kg  07/13/18 (!) 166.9 kg     Exam  General: Alert and oriented x 3, NAD  Eyes  HEENT:  Atraumatic, normocephalic  Cardiovascular: S1 S2 auscultated,  Regular rate and rhythm.  Respiratory: Diminished breath sound at the bases  Gastrointestinal: Soft, nondistended, PD catheter, indurated area in the left lower quadrant around the PD catheter, tender  Ext: no pedal edema bilaterally  Neuro: No new deficits  Musculoskeletal: No digital cyanosis, clubbing  Skin: No rashes  Psych: Normal affect and demeanor, alert and oriented x3    Data Reviewed:  I have personally reviewed following labs and imaging studies  Micro Results Recent Results (from the past 240 hour(s))  Gram stain     Status: None   Collection Time: 04/20/19  2:30 AM  Result Value Ref Range Status   Specimen Description PERITONEAL DIALYSATE  Final   Special Requests Normal  Final   Gram Stain   Final    CYTOSPIN SMEAR WBC PRESENT, PREDOMINANTLY PMN NO ORGANISMS SEEN RESULT CALLED TO, READ BACK BY AND VERIFIED WITH: J SAPPELT,RN _0  04/20/19 [facility] Performed at [facility], [address], [place], [place] [address]    Report Status 04/20/2019 FINAL  Final  Culture, body fluid-bottle     Status: Abnormal   Collection Time: 04/20/19  2:30 AM  Result Value Ref Range Status   Specimen Description   Final    PERITONEAL DIALYSATE Performed at [facility], [address] [place], [place] [address]    Special Requests   Final    PERITONEAL DIALYSATE Performed at [facility], [address], [place], [place] [address]    Gram Stain   Final    GRAM POSITIVE COCCI ANAEROBIC BOTTLE Gram Stain Report Called to,Read Back By and Verified  With: M. MUELLER _1   04/20/19 [facility] Performed at [facility], [address], [place], [place] [address]    Culture STAPHYLOCOCCUS EPIDERMIDIS (A)  Final   Report Status 04/22/2019 FINAL  Final   Organism ID, Bacteria STAPHYLOCOCCUS EPIDERMIDIS  Final      Susceptibility   Staphylococcus epidermidis - MIC*    CIPROFLOXACIN <=0.5 SENSITIVE Sensitive     ERYTHROMYCIN >=8 RESISTANT Resistant     GENTAMICIN <=0.5 SENSITIVE Sensitive     OXACILLIN >=4 RESISTANT Resistant     TETRACYCLINE 2 SENSITIVE Sensitive     VANCOMYCIN 2 SENSITIVE Sensitive     TRIMETH/SULFA <=10 SENSITIVE Sensitive     CLINDAMYCIN <=0.25 SENSITIVE Sensitive     RIFAMPIN <=0.5 SENSITIVE Sensitive     Inducible Clindamycin NEGATIVE Sensitive     * STAPHYLOCOCCUS EPIDERMIDIS  Stat Gram stain     Status: None   Collection Time: 04/21/19  1:31 AM  Result Value Ref Range Status   Specimen Description PERITONEAL FLUID  Final   Special Requests NONE  Final   Gram Stain   Final    WBC PRESENT, PREDOMINANTLY PMN NO ORGANISMS SEEN CYTOSPIN SMEAR Performed at [facility], [address] [place],  [address]    Report Status 04/21/2019 FINAL  Final  Culture, body fluid-bottle     Status: None (Preliminary result)   Collection Time: 04/21/19  1:33 AM  Result Value Ref Range Status   Specimen Description PERITONEAL FLUID  Final   Special Requests   Final    BOTTLES DRAWN AEROBIC AND ANAEROBIC Blood Culture adequate volume   Culture   Final    NO GROWTH 4 DAYS Performed at [facility], [place] [address] [place],  [address]    Report Status PENDING  Incomplete  MRSA PCR Screening     Status: None   Collection Time: 04/24/19 11:38 AM  Result Value Ref Range Status   MRSA by PCR NEGATIVE NEGATIVE Final    Comment:        The GeneXpert MRSA Assay (FDA approved  for NASAL specimens only), is one component of a comprehensive MRSA colonization surveillance program. It is not intended to  diagnose MRSA infection nor to guide or monitor treatment for MRSA infections. Performed at [facility], [place] [address], [place], [place] [address]     Radiology Reports Ct Abdomen Pelvis Wo Contrast  Result Date: 04/20/2019 CLINICAL DATA:  Initial evaluation for acute abdominal pain, shortness of breath while undergoing peritoneal dialysis. EXAM: CT ABDOMEN AND PELVIS WITHOUT CONTRAST TECHNIQUE: Multidetector CT imaging of the abdomen and pelvis was performed following the standard protocol without IV contrast. COMPARISON:  Prior CT from 12/27/2018. FINDINGS: Lower chest: Atelectatic changes seen dependently within the visualized lung bases. Visualized lungs are otherwise clear. Hepatobiliary: Liver demonstrates a normal unenhanced appearance. Gallbladder absent. No biliary dilatation. Pancreas: Pancreas within normal limits. Spleen: Spleen within normal limits. Adrenals/Urinary Tract: Adrenal glands are normal. Kidneys equal in size without nephrolithiasis or hydronephrosis. No hydroureter. Bladder decompressed without abnormality. Stomach/Bowel: Stomach within normal limits. No evidence for bowel obstruction. No evidence for acute appendicitis. Mild colonic diverticulosis without evidence for acute diverticulitis. No acute inflammatory changes seen about the bowels. Vascular/Lymphatic: Intra-abdominal aorta of normal caliber. Mild aorto bi-iliac atherosclerotic disease. No adenopathy. Reproductive: Prior hysterectomy.  No adnexal mass. Other: Peritoneal dialysis catheter in place extending via the left lower quadrant, tip position within the left lower quadrant stable from previous. Persistent 2.2 cm collection at the entry site of the catheter, slightly decreased from previous (series 2, image 68). Persistent soft tissue stranding within the partially visualized adjacent abdominal wall extending into the pannus, suggesting persistent cellulitis/panniculitis, improved in appearance relative to  previous exam. No soft tissue emphysema. Scattered free air with trace free fluid within the abdomen compatible with dialysis. Musculoskeletal: No acute osseous abnormality. No discrete lytic or blastic osseous lesions. IMPRESSION: 1. Persistent soft tissue stranding within the left anterior abdominal wall and pannus, suggesting possible cellulitis/panniculitis, improved in appearance relative to most recent CT from 12/27/2018. Persistent 2.2 cm collection at the peritoneal dialysis catheter in treat point, slightly decreased from previous as well. 2. No other acute intra-abdominal or pelvic process. 3. Free intraperitoneal air, compatible with peritoneal dialysis. 4. Mild colonic diverticulosis without evidence for acute diverticulitis. Electronically Signed   By: Jeannine Boga M.D.   On: 04/20/2019 04:32   Dg Abd 2 Views  Result Date: 04/23/2019 CLINICAL DATA:  Abdominal pain and vomiting. EXAM: ABDOMEN - 2 VIEW COMPARISON:  CT abdomen and pelvis 04/20/2019. Plain films of the abdomen 12/30/2018. FINDINGS: No free intraperitoneal air is identified. The bowel gas pattern is nonobstructive. Radiopaque densities projecting in the right abdomen could be retained contrast in diverticula or medicine. IMPRESSION: No acute finding. Electronically Signed   By: Inge Rise M.D.   On: 04/23/2019 12:09    Lab Data:  CBC: Recent Labs  Lab 04/20/19 0229  04/21/19 0222 04/22/19 0413 04/23/19 0522 04/24/19 0401 04/25/19 0352  WBC 14.8*   < > 11.5* 8.6 8.8 7.6 8.2  NEUTROABS 12.7*  --   --  6.5 6.5 5.2 5.2  HGB 11.0*   < > 10.5* 10.2* 10.4* 9.9* 9.2*  HCT 34.6*   < > 32.5* 31.8* 31.9* 30.6* 29.3*  MCV 99.7   < > 98.2 98.1 98.5 98.1 98.7  PLT 316   < > 280 286 275 253 247   < > = values in this interval not displayed.   Basic Metabolic Panel: Recent Labs  Lab 04/20/19 0229 04/20/19 1228 04/21/19  0222 04/22/19 0413 04/23/19 0522 04/24/19 0401 04/25/19 0352  NA 131*  --  135 137 138 136  138  K 3.0*  --  4.0 3.4* 3.4* 3.7 3.8  CL 90*  --  97* 100 104 98 98  CO2 25  --  23 20* 19* 24 25  GLUCOSE 177*  --  143* 80 79 90 100*  BUN 41*  --  42* 48* 49* 19 26*  CREATININE 9.49* 9.27* 9.67* 10.65* 11.14* 6.20* 7.84*  CALCIUM 8.1*  --  8.5* 8.4* 8.1* 8.1* 8.8*  MG 1.5* 1.8  --   --  2.1  --   --   PHOS  --  5.7*  --   --   --  3.5 4.4   GFR: Estimated Creatinine Clearance: 12 mL/min (A) (by C-G formula based on SCr of 7.84 mg/dL (H)). Liver Function Tests: Recent Labs  Lab 04/20/19 0229 04/25/19 0352  AST 14*  --   ALT 14  --   ALKPHOS 136*  --   BILITOT 0.6  --   PROT 6.5  --   ALBUMIN 2.3* 1.8*   Recent Labs  Lab 04/20/19 0229  LIPASE 24   No results for input(s): AMMONIA in the last 168 hours. Coagulation Profile: No results for input(s): INR, PROTIME in the last 168 hours. Cardiac Enzymes: No results for input(s): CKTOTAL, CKMB, CKMBINDEX, TROPONINI in the last 168 hours. BNP (last 3 results) No results for input(s): PROBNP in the last 8760 hours. HbA1C: No results for input(s): HGBA1C in the last 72 hours. CBG: Recent Labs  Lab 04/24/19 0841 04/24/19 1141 04/24/19 1630 04/24/19 2124 04/25/19 0730  GLUCAP 71 89 132* 111* 91   Lipid Profile: No results for input(s): CHOL, HDL, LDLCALC, TRIG, CHOLHDL, LDLDIRECT in the last 72 hours. Thyroid Function Tests: No results for input(s): TSH, T4TOTAL, FREET4, T3FREE, THYROIDAB in the last 72 hours. Anemia Panel: No results for input(s): VITAMINB12, FOLATE, FERRITIN, TIBC, IRON, RETICCTPCT in the last 72 hours. Urine analysis:    Component Value Date/Time   COLORURINE YELLOW 08/11/2016 1800   APPEARANCEUR HAZY (A) 08/11/2016 1800   LABSPEC 1.020 08/11/2016 1800   PHURINE 5.5 08/11/2016 1800   GLUCOSEU NEGATIVE 08/11/2016 1800   HGBUR MODERATE (A) 08/11/2016 1800   BILIRUBINUR NEGATIVE 08/11/2016 1800   KETONESUR NEGATIVE 08/11/2016 1800   PROTEINUR 100 (A) 08/11/2016 1800   UROBILINOGEN 0.2  12/02/2014 1925   NITRITE NEGATIVE 08/11/2016 1800   LEUKOCYTESUR NEGATIVE 08/11/2016 1800     Cristofer Yaffe M.D. [facility] 04/25/2019, 4:21 PM  Pager: [phone] Between 7am to 7pm - call Pager - [phone]  After 7pm go to [email or web address] - password [email or web address]  Call night coverage person covering after 7pm

## 2019-04-25 NOTE — Progress Notes (Signed)
Pt complained of abdominal pain while on PD tx. And wants to be disconnected from machine. Nephrologist notified and ordered to terminate PD tx. Specimen collected and sent to lab.

## 2019-04-25 NOTE — Consult Note (Signed)
[facility] Surgery Consult Note  [name] 11/29/1966  [id number].    Requesting MD: Dr. Lawson Radar Chief Complaint/Reason for Consult: Infected PD catheter   HPI: [name] is a 53 y.o. female with a PMhx of CHF, CAD, IDDM, morbid obesity, ESRD previously on PD who was admitted on 5/1 for infected PD catheter. The patient reports that on 4/30-5/1 when trying to use her PD catheter her abdomen felt bloated and painful followed by N/V. She presented to the ED on 5/1 that showed soft tissue stranding within the left anterior abdominal wall. There was a persistent 2.2 cm collection at the peritoneal dialysis catheter in treat point. This fluid collection was first noticed on CT on 1/8 when the patient presented with similar symptoms that improved on antibiotics. Her PD catheter was attempted to be used in the hospital during this stay but was unsuccessful. She was transitioned to HD on 5/4. She reports the area continues to feel swollen and is tender to touch. She had some chills yesterday but denies fever since onset. General surgery was asked to consult for removal of PD catheter.   ROS: Review of Systems  Constitutional: Positive for chills and weight loss. Negative for fever.  Respiratory: Negative for shortness of breath.   Cardiovascular: Negative for chest pain.  Gastrointestinal: Positive for abdominal pain, constipation, nausea and vomiting.  All other systems reviewed and are negative.  All systems reviewed and otherwise negative except for as above  Family History  Problem Relation Age of Onset  . Colon cancer Mother 83  . Stroke Mother 46  . Coronary artery disease Mother     Past Medical History:  Diagnosis Date  . Anxiety   . Asthma    as a child  . Cancer ([place])    thyroid  . Cellulitis   . CHF (congestive heart failure) ([place])   . Chiari malformation    s/p surgery  . Chiari malformation   . Chronic pain   . Complication of anesthesia 11/28/15    Resp arrest after  conscious  sedation  . Constipation   . Coronary artery disease    40-50% mid LAD 04/29/09, Medical tx. (Dr. Gwenlyn Found)  . Diabetes mellitus    Type II- reports being off all medication d/t it being controlled  . Fibromyalgia   . History of blood transfusion    hemorrage duinrg pregancy  . Hx of echocardiogram 10/2011   EF 55-60%  . Hypercholesterolemia   . Hypertension   . Lymph edema   . Obesity hypoventilation syndrome ([place])   . On home oxygen therapy    "2L; 24/7" (12/21/2018)  . Pneumonia    in past  . Pulmonary hypertension ([place])   . Renal disorder    M/W/F [facility] Pt started dialysis in Dec.2016  . S/P colonoscopy 05/26/2007   Dr. Laural Golden sigmoid diverticulosis random biopsies benign  . S/P endoscopy 05/01/2009   Dr. Penelope Coop pill-induced esophageal ulcerations distal to midesophagus, 2 small ulcers in the antrum of the stomach  . Shortness of breath dyspnea    with any exertion or if heart rate  is irregular while on dialysis  . Sleep apnea    reports that she no longer needs CPAP due to weight loss    Past Surgical History:  Procedure Laterality Date  . Marland KitchenHemodialysis catheter Right 11/28/2015  . A/V FISTULAGRAM N/A 07/26/2017   Procedure: A/V Fistulagram - Left Arm;  Surgeon: Serafina Mitchell, MD;  Location: [facility]  INVASIVE CV LAB;  Service: Cardiovascular;  Laterality: N/A;  . ABDOMINAL HYSTERECTOMY    . ADENOIDECTOMY    . AV FISTULA PLACEMENT Left 03/15/2016   Procedure: CREATION OF LEFT ARM ARTERIOVENOUS (AV) FISTULA  ;  Surgeon: Rosetta Posner, MD;  Location: [place] [facility];  Service: Vascular;  Laterality: Left;  . CAPD INSERTION N/A 08/30/2017   Procedure: LAPAROSCOPIC INSERTION CONTINUOUS AMBULATORY PERITONEAL DIALYSIS  (CAPD) CATHETER;  Surgeon: Clovis Riley, MD;  Location: [place];  Service: General;  Laterality: N/A;  . CESAREAN SECTION      x 2  . CRANIECTOMY SUBOCCIPITAL W/ CERVICAL LAMINECTOMY / CHIARI    . FISTULA SUPERFICIALIZATION Left  05/10/2016   Procedure: Left Arm FISTULA SUPERFICIALIZATION;  Surgeon: Rosetta Posner, MD;  Location: [facility] OR;  Service: Vascular;  Laterality: Left;  . IR GENERIC HISTORICAL  08/14/2016   IR REMOVAL TUN ACCESS W/ PORT W/O FL MOD SED 08/14/2016 Arne Cleveland, MD [facility]-INTERV RAD  . IR SINUS/FIST TUBE CHK-NON GI  01/01/2019  . IR US GUIDE BX ASP/DRAIN  12/28/2018  . PERIPHERAL VASCULAR CATHETERIZATION Left 11/25/2016   Procedure: A/V Fistulagram;  Surgeon: Conrad Thebes, MD;  Location: [facility] CV LAB;  Service: Cardiovascular;  Laterality: Left;  arm  . PORTACATH PLACEMENT  07/05/2012   Procedure: INSERTION PORT-A-CATH;  Surgeon: Donato Heinz, MD;  Location: [facility];  Service: General;  Laterality: Left;  subclavian  . portacath removal    . RIGHT HEART CATH N/A 08/04/2017   Procedure: RIGHT HEART CATH;  Surgeon: Jolaine Artist, MD;  Location: [facility] CV LAB;  Service: Cardiovascular;  Laterality: N/A;  . THYROIDECTOMY, PARTIAL    . TONSILLECTOMY      Social History:  reports that she has never smoked. She has never used smokeless tobacco. She reports that she does not drink alcohol or use drugs.  Allergies:  Allergies  Allergen Reactions  . Contrast Media [Iodinated Diagnostic Agents] Anaphylaxis, Hives, Swelling and Other (See Comments)    Dye for cardiac cath. Tongue swells  . Pneumococcal Vaccines Swelling and Other (See Comments)    Turns skin black, and bodily swelling  . Vancomycin Nausea And Vomiting and Other (See Comments)    Infusion "made me feel like I was dying" had to be readmitted to hospital    Medications Prior to Admission  Medication Sig Dispense Refill  . acetaminophen (TYLENOL) 325 MG tablet Take 650 mg by mouth every 6 (six) hours as needed for moderate pain or fever.     Marland Kitchen albuterol (PROVENTIL) (2.5 MG/3ML) 0.083% nebulizer solution Take 2.5 mg by nebulization every 6 (six) hours as needed for wheezing or shortness of breath.    . ALPRAZolam (XANAX) 0.5 MG  tablet Take 0.5 mg by mouth 3 (three) times daily as needed for anxiety or sleep.     Marland Kitchen amLODipine (NORVASC) 10 MG tablet Take 0.5 tablets (5 mg total) by mouth daily. 15 tablet 12  . blood glucose meter kit and supplies KIT Dispense based on patient and insurance preference. Use up to four times daily as directed. (FOR ICD-9 250.00, 250.01). 1 each 0  . calcitRIOL (ROCALTROL) 0.5 MCG capsule Take 0.5 mcg by mouth daily.    . diphenhydrAMINE (BENADRYL) 25 mg capsule Take 25 mg by mouth every 6 (six) hours as needed for allergies.    Marland Kitchen insulin detemir (LEVEMIR) 100 UNIT/ML injection Inject 10 Units into the skin daily.    Marland Kitchen lanthanum (FOSRENOL) 500 MG chewable  tablet Chew 1,000-1,500 mg by mouth 5 (five) times daily. Patient takes 3 tablets by mouth three times a day with meals and 2 tablets by mouth twice a day with snacks    . Oxycodone HCl 10 MG TABS Take 1 tablet (10 mg total) by mouth 4 (four) times daily as needed (pain).    . potassium chloride 20 MEQ TBCR Take 40 mEq by mouth daily. 30 tablet 2  . senna (SENOKOT) 8.6 MG tablet Take 1 tablet by mouth daily.    . multivitamin (RENA-VIT) TABS tablet Take 1 tablet by mouth daily.  11    Prior to Admission medications   Medication Sig Start Date End Date Taking? Authorizing Provider  acetaminophen (TYLENOL) 325 MG tablet Take 650 mg by mouth every 6 (six) hours as needed for moderate pain or fever.    Yes [provider]  albuterol (PROVENTIL) (2.5 MG/3ML) 0.083% nebulizer solution Take 2.5 mg by nebulization every 6 (six) hours as needed for wheezing or shortness of breath.   Yes [provider]  ALPRAZolam Duanne Moron) 0.5 MG tablet Take 0.5 mg by mouth 3 (three) times daily as needed for anxiety or sleep.    Yes [provider]  amLODipine (NORVASC) 10 MG tablet Take 0.5 tablets (5 mg total) by mouth daily. 06/11/16  Yes [name], MD  blood glucose meter kit and supplies KIT Dispense based on patient and insurance  preference. Use up to four times daily as directed. (FOR ICD-9 250.00, 250.01). 06/30/18  Yes Lama, Marge Duncans, MD  calcitRIOL (ROCALTROL) 0.5 MCG capsule Take 0.5 mcg by mouth daily. 10/19/18  Yes [provider]  diphenhydrAMINE (BENADRYL) 25 mg capsule Take 25 mg by mouth every 6 (six) hours as needed for allergies.   Yes [provider]  insulin detemir (LEVEMIR) 100 UNIT/ML injection Inject 10 Units into the skin daily.   Yes [provider]  lanthanum (FOSRENOL) 500 MG chewable tablet Chew 1,000-1,500 mg by mouth 5 (five) times daily. Patient takes 3 tablets by mouth three times a day with meals and 2 tablets by mouth twice a day with snacks   Yes [provider]  Oxycodone HCl 10 MG TABS Take 1 tablet (10 mg total) by mouth 4 (four) times daily as needed (pain). 02/01/17  Yes [name], MD  potassium chloride 20 MEQ TBCR Take 40 mEq by mouth daily. 06/30/18  Yes Oswald Hillock, MD  senna (SENOKOT) 8.6 MG tablet Take 1 tablet by mouth daily.   Yes [provider]  multivitamin (RENA-VIT) TABS tablet Take 1 tablet by mouth daily. 05/30/18   [provider]  insulin lispro (HUMALOG) 100 UNIT/ML injection Inject 60 Units into the skin 2 (two) times daily.    03/08/12  [provider]    Blood pressure (!) 89/50, pulse 61, temperature 98.2 F (36.8 C), temperature source Oral, resp. rate 16, height _0  (1.651 m), weight (!) 142.3 kg, SpO2 100 %. Physical Exam: General: pleasant, morbidly obese AA female who is laying in bed in NAD. Currently undergoing HD HEENT: head is normocephalic, atraumatic.  Sclera are noninjected.  Pupils equal and round.  Ears and nose without any masses or lesions.  Mouth is pink and moist. Dentition fair Heart: regular, rate, and rhythm.  No obvious murmurs, gallops, or rubs noted.  Palpable pedal pulses bilaterally Lungs: CTAB, no wheezes, rhonchi, or rales noted.  Respiratory effort nonlabored Abd: Soft,  ND. PD catheter in place in left abdomen  with significant area of surrounding induration with fluctuance or discharge. Tenderness to palpation over these areas. +BS, no masses, hernias, or organomegaly MS: all 4 extremities are symmetrical with no cyanosis, clubbing. Lower extremity edema noted. Skin: warm and dry with no masses, lesions, or rashes Psych: A&Ox3 with an appropriate affect. Neuro: cranial nerves grossly intact, extremity CSM intact bilaterally, normal speech  Results for orders placed or performed during the hospital encounter of 04/20/19 (from the past 48 hour(s))  Body fluid cell count with differential     Status: Abnormal   Collection Time: 04/23/19  7:42 PM  Result Value Ref Range   Fluid Type-FCT PERITONEAL DIALYSATE     Comment: FLUID CORRECTED ON 05/04 AT 1943: PREVIOUSLY REPORTED AS Peritoneal    Color, Fluid STRAW    Appearance, Fluid CLEAR CLEAR   WBC, Fluid 375 0 - 1,000 cu mm   Neutrophil Count, Fluid 90 (H) 0 - 25 %   Lymphs, Fluid 1 %   Monocyte-Macrophage-Serous Fluid 7 (L) 50 - 90 %   Eos, Fluid 2 %    Comment: Performed at [facility], [place]. [address], [place], [place] [address]  Pathologist smear review     Status: None   Collection Time: 04/23/19  7:42 PM  Result Value Ref Range   Path Review Acute inflammation     Comment: Reviewed by Arrie Aran L. Melina Copa, M.D. 04/25/2019 Performed at [facility], [place] [address] [place], [place] [address]   Glucose, capillary     Status: None   Collection Time: 04/23/19  8:39 PM  Result Value Ref Range   Glucose-Capillary 78 70 - 99 mg/dL   Comment 1 Notify RN    Comment 2 Document in Chart   Glucose, capillary     Status: None   Collection Time: 04/23/19 10:23 PM  Result Value Ref Range   Glucose-Capillary 90 70 - 99 mg/dL  Basic metabolic panel     Status: Abnormal   Collection Time: 04/24/19  4:01 AM  Result Value Ref Range   Sodium 136 135 - 145 mmol/L   Potassium 3.7 3.5 - 5.1 mmol/L    Chloride 98 98 - 111 mmol/L   CO2 24 22 - 32 mmol/L   Glucose, Bld 90 70 - 99 mg/dL   BUN 19 6 - 20 mg/dL   Creatinine, Ser 6.20 (H) 0.44 - 1.00 mg/dL    Comment: DELTA CHECK NOTED   Calcium 8.1 (L) 8.9 - 10.3 mg/dL   GFR calc non Af Amer 7 (L) >60 mL/min   GFR calc Af Amer 8 (L) >60 mL/min   Anion gap 14 5 - 15    Comment: Performed at [facility] [address] [place] [address]  CBC with Differential/Platelet     Status: Abnormal   Collection Time: 04/24/19  4:01 AM  Result Value Ref Range   WBC 7.6 4.0 - 10.5 K/uL   RBC 3.12 (L) 3.87 - 5.11 MIL/uL   Hemoglobin 9.9 (L) 12.0 - 15.0 g/dL   HCT 30.6 (L) 36.0 - 46.0 %   MCV 98.1 80.0 - 100.0 fL   MCH 31.7 26.0 - 34.0 pg   MCHC 32.4 30.0 - 36.0 g/dL   RDW 14.7 11.5 - 15.5 %   Platelets 253 150 - 400 K/uL   nRBC 0.3 (H) 0.0 - 0.2 %   Neutrophils Relative % 67 %   Neutro Abs 5.2 1.7 - 7.7 K/uL  Lymphocytes Relative 15 %   Lymphs Abs 1.1 0.7 - 4.0 K/uL   Monocytes Relative 10 %   Monocytes Absolute 0.8 0.1 - 1.0 K/uL   Eosinophils Relative 4 %   Eosinophils Absolute 0.3 0.0 - 0.5 K/uL   Basophils Relative 1 %   Basophils Absolute 0.0 0.0 - 0.1 K/uL   Immature Granulocytes 3 %   Abs Immature Granulocytes 0.20 (H) 0.00 - 0.07 K/uL    Comment: Performed at [facility] [address], [place], [place] [address]  Phosphorus     Status: None   Collection Time: 04/24/19  4:01 AM  Result Value Ref Range   Phosphorus 3.5 2.5 - 4.6 mg/dL    Comment: Performed at [facility] [address], [place], [place] [address]  Glucose, capillary     Status: None   Collection Time: 04/24/19  8:41 AM  Result Value Ref Range   Glucose-Capillary 71 70 - 99 mg/dL  MRSA PCR Screening     Status: None   Collection Time: 04/24/19 11:38 AM  Result Value Ref Range   MRSA by PCR NEGATIVE NEGATIVE    Comment:        The GeneXpert MRSA Assay (FDA approved for NASAL specimens only), is one component of  a comprehensive MRSA colonization surveillance program. It is not intended to diagnose MRSA infection nor to guide or monitor treatment for MRSA infections. Performed at [facility], [place] [address] [place], [place] [address]   Glucose, capillary     Status: None   Collection Time: 04/24/19 11:41 AM  Result Value Ref Range   Glucose-Capillary 89 70 - 99 mg/dL  Glucose, capillary     Status: Abnormal   Collection Time: 04/24/19  4:30 PM  Result Value Ref Range   Glucose-Capillary 132 (H) 70 - 99 mg/dL  Glucose, capillary     Status: Abnormal   Collection Time: 04/24/19  9:24 PM  Result Value Ref Range   Glucose-Capillary 111 (H) 70 - 99 mg/dL  Body fluid cell count with differential     Status: Abnormal   Collection Time: 04/24/19 11:35 PM  Result Value Ref Range   Fluid Type-FCT PERITONEAL DIALYSATE     Comment: FLUID CORRECTED ON 05/05 AT 2344: PREVIOUSLY REPORTED AS Peritoneal    Color, Fluid COLORLESS    Appearance, Fluid CLEAR CLEAR   WBC, Fluid 18 0 - 1,000 cu mm   Neutrophil Count, Fluid 65 (H) 0 - 25 %   Lymphs, Fluid 20 %   Monocyte-Macrophage-Serous Fluid 7 (L) 50 - 90 %   Eos, Fluid 8 %    Comment: Performed at [facility], [place] [address] [place], [place] [address]  Pathologist smear review     Status: None   Collection Time: 04/24/19 11:35 PM  Result Value Ref Range   Path Review Mixed inflammation with neutrophils     Comment: Reviewed by Johnathan Hausen, M.D. 04/25/2019 Performed at [facility], [place] [address] [place], [place] [address]   CBC with Differential/Platelet     Status: Abnormal   Collection Time: 04/25/19  3:52 AM  Result Value Ref Range   WBC 8.2 4.0 - 10.5 K/uL   RBC 2.97 (L) 3.87 - 5.11 MIL/uL   Hemoglobin 9.2 (L) 12.0 - 15.0 g/dL   HCT 29.3 (L) 36.0 - 46.0 %   MCV 98.7 80.0 - 100.0 fL   MCH 31.0 26.0 - 34.0 pg  MCHC 31.4 30.0 - 36.0 g/dL   RDW 14.7 11.5 - 15.5 %   Platelets 247 150 - 400 K/uL   nRBC 0.0 0.0  - 0.2 %   Neutrophils Relative % 64 %   Neutro Abs 5.2 1.7 - 7.7 K/uL   Lymphocytes Relative 20 %   Lymphs Abs 1.7 0.7 - 4.0 K/uL   Monocytes Relative 9 %   Monocytes Absolute 0.8 0.1 - 1.0 K/uL   Eosinophils Relative 3 %   Eosinophils Absolute 0.3 0.0 - 0.5 K/uL   Basophils Relative 1 %   Basophils Absolute 0.1 0.0 - 0.1 K/uL   Immature Granulocytes 3 %   Abs Immature Granulocytes 0.27 (H) 0.00 - 0.07 K/uL    Comment: Performed at [facility] [address], [place], [place] [address]  Vancomycin, random     Status: None   Collection Time: 04/25/19  3:52 AM  Result Value Ref Range   Vancomycin Rm 26     Comment:        Random Vancomycin therapeutic range is dependent on dosage and time of specimen collection. A peak range is 20.0-40.0 ug/mL A trough range is 5.0-15.0 ug/mL        Performed at [facility] [address] [place], [place] [address]   Renal function panel     Status: Abnormal   Collection Time: 04/25/19  3:52 AM  Result Value Ref Range   Sodium 138 135 - 145 mmol/L   Potassium 3.8 3.5 - 5.1 mmol/L   Chloride 98 98 - 111 mmol/L   CO2 25 22 - 32 mmol/L   Glucose, Bld 100 (H) 70 - 99 mg/dL   BUN 26 (H) 6 - 20 mg/dL   Creatinine, Ser 7.84 (H) 0.44 - 1.00 mg/dL   Calcium 8.8 (L) 8.9 - 10.3 mg/dL   Phosphorus 4.4 2.5 - 4.6 mg/dL   Albumin 1.8 (L) 3.5 - 5.0 g/dL   GFR calc non Af Amer 5 (L) >60 mL/min   GFR calc Af Amer 6 (L) >60 mL/min   Anion gap 15 5 - 15    Comment: Performed at [facility] [address] [place], [place] [address]  Glucose, capillary     Status: None   Collection Time: 04/25/19  7:30 AM  Result Value Ref Range   Glucose-Capillary 91 70 - 99 mg/dL   No results found.  Anti-infectives (From admission, onward)   Start     Dose/Rate Route Frequency Ordered Stop   04/23/19 1700  vancomycin (VANCOCIN) IVPB 1000 mg/200 mL premix     1,000 mg 200 mL/hr over 60 Minutes Intravenous  Once 04/23/19 1437 04/23/19 1925    04/21/19 2200  piperacillin-tazobactam (ZOSYN) IVPB 2.25 g  Status:  Discontinued     2.25 g 100 mL/hr over 30 Minutes Intravenous Every 12 hours 04/21/19 1127 04/22/19 1137   04/20/19 1730  vancomycin (VANCOCIN) 2,500 mg in sodium chloride 0.9 % 500 mL IVPB     2,500 mg 250 mL/hr over 120 Minutes Intravenous  Once 04/20/19 1633 04/20/19 1927   04/20/19 1645  vancomycin variable dose per unstable renal function (pharmacist dosing)      Does not apply See admin instructions 04/20/19 1645     04/20/19 1000  piperacillin-tazobactam (ZOSYN) IVPB 3.375 g  Status:  Discontinued     3.375 g 12.5 mL/hr over 240 Minutes Intravenous Every 12 hours 04/20/19 0911 04/21/19 1127   04/20/19 0500  ceFEPIme (MAXIPIME) 2 g in sodium chloride 0.9 % 100 mL IVPB     2 g 200 mL/hr over 30 Minutes Intravenous  Once 04/20/19 0447 04/20/19 0609       Assessment/Plan CHF CAD IDDM Morbid obesity  ESRD previously on PD, now on HD Abdominal Wall Cellulitis  Infected PD Catheter  - Discussed conservative therapy of watch and wait w/ continued abx vs removal of PD catheter with patient and Dr. Carolin Sicks. Will plan for removal tomorrow AM. Patient understands risks and benefits and would like to proceed.  - NPO after midnight - Continue IV abx   ID - Vancomycin 5/1 >> WBC 8.2 VTE - Heparin, SCD FEN - Renal, NPO after midnight  Jillyn Ledger, [facility] Surgery 04/25/2019, 2:36 PM Pager: [phone]

## 2019-04-26 ENCOUNTER — Encounter (HOSPITAL_COMMUNITY)
Admission: EM | Disposition: A | Discharge status: Home Health | Payer: Self-pay | Source: Home / Self Care | Attending: Internal Medicine

## 2019-04-26 ENCOUNTER — Inpatient Hospital Stay (HOSPITAL_COMMUNITY): Payer: Medicare Other | Admitting: Anesthesiology

## 2019-04-26 ENCOUNTER — Encounter (HOSPITAL_COMMUNITY): Payer: Self-pay | Admitting: Certified Registered Nurse Anesthetist

## 2019-04-26 DIAGNOSIS — R1010 Upper abdominal pain, unspecified: Secondary | ICD-10-CM

## 2019-04-26 DIAGNOSIS — E0800 Diabetes mellitus due to underlying condition with hyperosmolarity without nonketotic hyperglycemic-hyperosmolar coma (NKHHC): Secondary | ICD-10-CM

## 2019-04-26 HISTORY — PX: MINOR REMOVAL OF PERITONEAL DIALYSIS CATHETER: SHX6397

## 2019-04-26 LAB — RENAL FUNCTION PANEL
Albumin: 2 g/dL — ABNORMAL LOW (ref 3.5–5.0)
Anion gap: 9 (ref 5–15)
BUN: 13 mg/dL (ref 6–20)
CO2: 27 mmol/L (ref 22–32)
Calcium: 8.4 mg/dL — ABNORMAL LOW (ref 8.9–10.3)
Chloride: 102 mmol/L (ref 98–111)
Creatinine, Ser: 5.16 mg/dL — ABNORMAL HIGH (ref 0.44–1.00)
GFR calc Af Amer: 10 mL/min — ABNORMAL LOW (ref >60)
GFR calc non Af Amer: 9 mL/min — ABNORMAL LOW (ref >60)
Glucose, Bld: 137 mg/dL — ABNORMAL HIGH (ref 70–99)
Phosphorus: 2.6 mg/dL (ref 2.5–4.6)
Potassium: 4.2 mmol/L (ref 3.5–5.1)
Sodium: 138 mmol/L (ref 135–145)

## 2019-04-26 LAB — CBC WITH DIFFERENTIAL/PLATELET
Abs Immature Granulocytes: 0.25 10*3/uL — ABNORMAL HIGH (ref 0.00–0.07)
Basophils Absolute: 0 10*3/uL (ref 0.0–0.1)
Basophils Relative: 0 %
Eosinophils Absolute: 0.3 10*3/uL (ref 0.0–0.5)
Eosinophils Relative: 3 %
HCT: 32.4 % — ABNORMAL LOW (ref 36.0–46.0)
Hemoglobin: 9.9 g/dL — ABNORMAL LOW (ref 12.0–15.0)
Immature Granulocytes: 3 %
Lymphocytes Relative: 20 %
Lymphs Abs: 1.8 10*3/uL (ref 0.7–4.0)
MCH: 30.7 pg (ref 26.0–34.0)
MCHC: 30.6 g/dL (ref 30.0–36.0)
MCV: 100.6 fL — ABNORMAL HIGH (ref 80.0–100.0)
Monocytes Absolute: 0.8 10*3/uL (ref 0.1–1.0)
Monocytes Relative: 8 %
Neutro Abs: 6.1 10*3/uL (ref 1.7–7.7)
Neutrophils Relative %: 66 %
Platelets: 244 10*3/uL (ref 150–400)
RBC: 3.22 MIL/uL — ABNORMAL LOW (ref 3.87–5.11)
RDW: 15 % (ref 11.5–15.5)
WBC: 9.2 10*3/uL (ref 4.0–10.5)
nRBC: 0 % (ref 0.0–0.2)

## 2019-04-26 LAB — GLUCOSE, CAPILLARY
Glucose-Capillary: 119 mg/dL — ABNORMAL HIGH (ref 70–99)
Glucose-Capillary: 113 mg/dL — ABNORMAL HIGH (ref 70–99)
Glucose-Capillary: 113 mg/dL — ABNORMAL HIGH (ref 70–99)
Glucose-Capillary: 125 mg/dL — ABNORMAL HIGH (ref 70–99)
Glucose-Capillary: 141 mg/dL — ABNORMAL HIGH (ref 70–99)

## 2019-04-26 LAB — CULTURE, BODY FLUID W GRAM STAIN -BOTTLE
Culture: NO GROWTH
Special Requests: ADEQUATE

## 2019-04-26 LAB — SURGICAL PCR SCREEN
MRSA, PCR: NEGATIVE
Staphylococcus aureus: NEGATIVE

## 2019-04-26 LAB — CULTURE, BODY FLUID-BOTTLE

## 2019-04-26 SURGERY — MINOR REMOVAL OF PERITONEAL DIALYSIS CATHETER
Anesthesia: General | Site: Abdomen

## 2019-04-26 MED ORDER — MIDAZOLAM HCL 5 MG/5ML IJ SOLN
INTRAMUSCULAR | Status: DC | PRN
  Administered 2019-04-26: 2 mg via INTRAVENOUS

## 2019-04-26 MED ORDER — CHLORHEXIDINE GLUCONATE CLOTH 2 % EX PADS
6.0000 | MEDICATED_PAD | Freq: Every day | CUTANEOUS | Status: DC
  Administered 2019-04-30: 6 via TOPICAL

## 2019-04-26 MED ORDER — ONDANSETRON HCL 4 MG/2ML IJ SOLN: 4.0000 mg | Freq: Four times a day (QID) | INTRAMUSCULAR | Status: DC | PRN

## 2019-04-26 MED ORDER — FENTANYL CITRATE (PF) 100 MCG/2ML IJ SOLN
INTRAMUSCULAR | Status: DC | PRN
  Administered 2019-04-26: 25 ug via INTRAVENOUS
  Administered 2019-04-26 (×2): 50 ug via INTRAVENOUS
  Administered 2019-04-26 (×3): 25 ug via INTRAVENOUS

## 2019-04-26 MED ORDER — 0.9 % SODIUM CHLORIDE (POUR BTL) OPTIME
TOPICAL | Status: DC | PRN
  Administered 2019-04-26: 1000 mL

## 2019-04-26 MED ORDER — PROPOFOL 10 MG/ML IV BOLUS
INTRAVENOUS | Status: DC | PRN
  Administered 2019-04-26: 200 mg via INTRAVENOUS

## 2019-04-26 MED ORDER — EPHEDRINE SULFATE-NACL 50-0.9 MG/10ML-% IV SOSY
PREFILLED_SYRINGE | INTRAVENOUS | Status: DC | PRN
  Administered 2019-04-26: 10 mg via INTRAVENOUS
  Administered 2019-04-26: 5 mg via INTRAVENOUS

## 2019-04-26 MED ORDER — EPHEDRINE 5 MG/ML INJ
INTRAVENOUS | Status: AC
  Filled 2019-04-26: qty 10

## 2019-04-26 MED ORDER — ONDANSETRON HCL 4 MG/2ML IJ SOLN
INTRAMUSCULAR | Status: DC | PRN
  Administered 2019-04-26: 4 mg via INTRAVENOUS

## 2019-04-26 MED ORDER — ONDANSETRON HCL 4 MG/2ML IJ SOLN
4.0000 mg | INTRAMUSCULAR | Status: DC | PRN
  Administered 2019-04-26 – 2019-04-28 (×4): 4 mg via INTRAVENOUS
  Filled 2019-04-26 (×3): qty 2

## 2019-04-26 MED ORDER — LIDOCAINE 2% (20 MG/ML) 5 ML SYRINGE
INTRAMUSCULAR | Status: DC | PRN
  Administered 2019-04-26: 60 mg via INTRAVENOUS

## 2019-04-26 MED ORDER — PROPOFOL 10 MG/ML IV BOLUS
INTRAVENOUS | Status: AC
  Filled 2019-04-26: qty 20

## 2019-04-26 MED ORDER — OXYCODONE HCL 5 MG PO TABS
ORAL_TABLET | ORAL | Status: AC
  Filled 2019-04-26: qty 1

## 2019-04-26 MED ORDER — OXYCODONE-ACETAMINOPHEN 5-325 MG PO TABS
1.0000 | ORAL_TABLET | ORAL | Status: DC | PRN
  Administered 2019-04-26 – 2019-04-30 (×9): 1 via ORAL
  Filled 2019-04-26 (×8): qty 1

## 2019-04-26 MED ORDER — VANCOMYCIN HCL IN DEXTROSE 1-5 GM/200ML-% IV SOLN
1000.0000 mg | Freq: Once | INTRAVENOUS | Status: AC
  Administered 2019-04-26: 1000 mg via INTRAVENOUS
  Filled 2019-04-26: qty 200

## 2019-04-26 MED ORDER — ONDANSETRON HCL 4 MG/2ML IJ SOLN
INTRAMUSCULAR | Status: AC
  Filled 2019-04-26: qty 2

## 2019-04-26 MED ORDER — MIDAZOLAM HCL 2 MG/2ML IJ SOLN
INTRAMUSCULAR | Status: AC
  Filled 2019-04-26: qty 2

## 2019-04-26 MED ORDER — FENTANYL CITRATE (PF) 250 MCG/5ML IJ SOLN
INTRAMUSCULAR | Status: AC
  Filled 2019-04-26: qty 5

## 2019-04-26 MED ORDER — FENTANYL CITRATE (PF) 100 MCG/2ML IJ SOLN
INTRAMUSCULAR | Status: AC
  Filled 2019-04-26: qty 2

## 2019-04-26 MED ORDER — FENTANYL CITRATE (PF) 100 MCG/2ML IJ SOLN
25.0000 ug | INTRAMUSCULAR | Status: DC | PRN
  Administered 2019-04-26: 25 ug via INTRAVENOUS

## 2019-04-26 MED ORDER — HYDROMORPHONE HCL 1 MG/ML IJ SOLN: 1.0000 mg | INTRAMUSCULAR | Status: DC | PRN

## 2019-04-26 MED ORDER — BUPIVACAINE-EPINEPHRINE 0.25% -1:200000 IJ SOLN
INTRAMUSCULAR | Status: DC | PRN
  Administered 2019-04-26: 30 mL

## 2019-04-26 MED ORDER — OXYCODONE HCL 5 MG PO TABS
5.0000 mg | ORAL_TABLET | Freq: Once | ORAL | Status: AC | PRN
  Administered 2019-04-26: 5 mg via ORAL

## 2019-04-26 MED ORDER — BUPIVACAINE-EPINEPHRINE (PF) 0.25% -1:200000 IJ SOLN
INTRAMUSCULAR | Status: AC
  Filled 2019-04-26: qty 30

## 2019-04-26 MED ORDER — OXYCODONE HCL 5 MG/5ML PO SOLN: 5.0000 mg | Freq: Once | ORAL | Status: AC | PRN

## 2019-04-26 MED ORDER — LIDOCAINE 2% (20 MG/ML) 5 ML SYRINGE
INTRAMUSCULAR | Status: AC
  Filled 2019-04-26: qty 5

## 2019-04-26 SURGICAL SUPPLY — 29 items
BLADE CLIPPER SURG (BLADE) IMPLANT
CANISTER SUCT 3000ML PPV (MISCELLANEOUS) IMPLANT
COVER SURGICAL LIGHT HANDLE (MISCELLANEOUS) ×3 IMPLANT
COVER WAND RF STERILE (DRAPES) IMPLANT
DERMABOND ADVANCED (GAUZE/BANDAGES/DRESSINGS) ×2
DERMABOND ADVANCED .7 DNX12 (GAUZE/BANDAGES/DRESSINGS) ×1 IMPLANT
DRAPE LAPAROSCOPIC ABDOMINAL (DRAPES) ×3 IMPLANT
ELECT REM PT RETURN 9FT ADLT (ELECTROSURGICAL) ×3
ELECTRODE REM PT RTRN 9FT ADLT (ELECTROSURGICAL) ×1 IMPLANT
GAUZE 4X4 16PLY RFD (DISPOSABLE) ×3 IMPLANT
GAUZE PACKING IODOFORM 1X5 (MISCELLANEOUS) ×3 IMPLANT
GLOVE BIOGEL M STRL SZ7.5 (GLOVE) ×3 IMPLANT
GLOVE INDICATOR 8.0 STRL GRN (GLOVE) ×3 IMPLANT
GOWN STRL REUS W/ TWL LRG LVL3 (GOWN DISPOSABLE) ×2 IMPLANT
GOWN STRL REUS W/TWL LRG LVL3 (GOWN DISPOSABLE) ×4
KIT BASIN OR (CUSTOM PROCEDURE TRAY) ×3 IMPLANT
KIT TURNOVER KIT B (KITS) ×3 IMPLANT
NEEDLE HYPO 25GX1X1/2 BEV (NEEDLE) ×3 IMPLANT
NS IRRIG 1000ML POUR BTL (IV SOLUTION) ×3 IMPLANT
PACK GENERAL/GYN (CUSTOM PROCEDURE TRAY) ×3 IMPLANT
PAD ARMBOARD 7.5X6 YLW CONV (MISCELLANEOUS) ×3 IMPLANT
PENCIL SMOKE EVACUATOR (MISCELLANEOUS) ×3 IMPLANT
SUT MNCRL AB 4-0 PS2 18 (SUTURE) ×3 IMPLANT
SUT VIC AB 0 CT1 27 (SUTURE) ×2
SUT VIC AB 0 CT1 27XBRD ANBCTR (SUTURE) ×1 IMPLANT
SUT VIC AB 3-0 SH 18 (SUTURE) ×3 IMPLANT
SYR CONTROL 10ML LL (SYRINGE) ×3 IMPLANT
TOWEL OR 17X24 6PK STRL BLUE (TOWEL DISPOSABLE) ×3 IMPLANT
TOWEL OR 17X26 10 PK STRL BLUE (TOWEL DISPOSABLE) ×3 IMPLANT

## 2019-04-26 NOTE — Op Note (Signed)
Preoperative diagnosis: Infected peritoneal dialysis catheter  Postoperative diagnosis: Same  Procedure: Removal of infected peritoneal dialysis catheter  Surgeon: Erroll Luna, MD  Anesthesia: General with 0.25% Sensorcaine local with epinephrine  EBL: 30 cc  Specimen none  Drains none  IV fluids: Per anesthesia record  Indications for procedure: The patient is a 53 year old female who had a peritoneal dialysis catheter placed at [facility] about 7 months ago.  She has been undergoing peritoneal dialysis but has developed peritonitis from the catheter.  She is been managed medically but unfortunately the peritonitis cannot be resolved and we were asked to to see her at the request of the nephrology service for removal of her peritoneal dialysis catheter and conversion to hemodialysis which she is already on.  We discussed the procedure and potential risk of bleeding, infection, catheter migration, catheter fragmentation, possible laparotomy, retained catheter, bowel injury, and the need further procedures and/or treatments.  She did not wish to keep the catheter any longer and agreed to proceed.The procedure has been discussed with the patient.  Alternative therapies have been discussed with the patient.  Operative risks include bleeding,  Infection,  Organ injury,  Nerve injury,  Blood vessel injury,  DVT,  Pulmonary embolism,  Death,  And possible reoperation.  Medical management risks include worsening of present situation.  The success of the procedure is 50 -90 % at treating patients symptoms.  The patient understands and agrees to proceed.   Description of procedure: The patient was met in the holding area and questions were answered.  She was taken back to the operating.  She is placed supine upon the operating table.  After induction of general esthesia the abdomen was prepped and draped in sterile fashion including the catheter insertion site in her left upper quadrant.  Local  anesthetic was infiltrated.  There were multiple incisions the catheter entered in the left upper quadrant and then inserted near the umbilicus but took a right angle turn up from insertion site down to the insertion site into the abdominal cavity.  There are multiple small incisions along this path.  I opened the first incision which was medial to the insertion site.  I dissected down and found the first cuff.  This was dissected free and allow the catheter to be pulled to this incision.  I then made an incision at the very bottom where the catheter inserted into the abdominal wall.  She was morbidly obese this is quite difficult.  I dissected down and freed it up there.  There was a third cuff in between these 2 points along the midline parallel to the midline.  I opened a another scar and dissected down and found the additional cuff and freed this up.  Once I could do all this the catheter slipped out abdominal cavity without difficulty entire pigtail was removed in its entirety and passed off the field.  Irrigation of all 4 incisions was undertaken.  These were all packed open after closing of the opening to the peritoneal cavity with 2-0 Vicryl.  There is no gross pus along the site.  Dry dressings were applied to all wounds at this point time after ensuring adequate hemostasis.  All final counts were then found to be correct.  Patient was awoke extubated taken to recovery in satisfactory condition.

## 2019-04-26 NOTE — Transfer of Care (Signed)
Immediate Anesthesia Transfer of Care Note  Patient: [name]  Procedure(s) Performed: REMOVAL OF INFECTED PERITONEAL DIALYSIS CATHETER (N/A Abdomen)  Patient Location: PACU  Anesthesia Type:General  Level of Consciousness: awake, patient cooperative and responds to stimulation  Airway & Oxygen Therapy: Patient Spontanous Breathing and Patient connected to nasal cannula oxygen  Post-op Assessment: Report given to RN and Post -op Vital signs reviewed and stable  Post vital signs: Reviewed and stable  Last Vitals:  Vitals Value Taken Time  BP 110/65 04/26/2019  8:55 AM  Temp    Pulse    Resp 14 04/26/2019  8:59 AM  SpO2    Vitals shown include unvalidated device data.  Last Pain:  Vitals:   04/26/19 0855  TempSrc:   PainSc: (P) Asleep      Patients Stated Pain Goal: 0 (14/38/88 7579)  Complications: No apparent anesthesia complications

## 2019-04-26 NOTE — Anesthesia Preprocedure Evaluation (Signed)
Anesthesia Evaluation  Patient identified by MRN, date of birth, ID band Patient awake    Reviewed: Allergy & Precautions, H&P , NPO status , Patient's Chart, lab work & pertinent test results  Airway Mallampati: II   Neck ROM: full    Dental   Pulmonary shortness of breath, asthma , sleep apnea ,    breath sounds clear to auscultation       Cardiovascular hypertension, + CAD and +CHF   Rhythm:regular Rate:Normal     Neuro/Psych PSYCHIATRIC DISORDERS Anxiety  Neuromuscular disease    GI/Hepatic   Endo/Other  diabetes, Type 2Morbid obesity  Renal/GU ESRF and DialysisRenal disease     Musculoskeletal  (+) Fibromyalgia -  Abdominal   Peds  Hematology   Anesthesia Other Findings   Reproductive/Obstetrics                             Anesthesia Physical Anesthesia Plan  ASA: III  Anesthesia Plan: General   Post-op Pain Management:    Induction: Intravenous  PONV Risk Score and Plan: 3 and Ondansetron and Treatment may vary due to age or medical condition  Airway Management Planned: Oral ETT  Additional Equipment:   Intra-op Plan:   Post-operative Plan: Extubation in OR  Informed Consent: I have reviewed the patients History and Physical, chart, labs and discussed the procedure including the risks, benefits and alternatives for the proposed anesthesia with the patient or authorized representative who has indicated his/her understanding and acceptance.       Plan Discussed with: CRNA, Anesthesiologist and Surgeon  Anesthesia Plan Comments:         Anesthesia Quick Evaluation

## 2019-04-26 NOTE — Progress Notes (Signed)
Pharmacy Antibiotic Note  STORY CONTI is a 53 y.o. female admitted on 04/20/2019 with abdominal pain and noted with a history of ESRD with peritoneal HD.   Pharmacy has been consulted for vancomycin dosing for GPC peritonitis. HD yesterday  Plan: -Vanc 1 g x 1 -F/U further HD schedule  Height: _0  (165.1 cm) Weight: (!) 311 lb 15.2 oz (141.5 kg) IBW/kg (Calculated) : 57  Temp (24hrs), Avg:97.9 F (36.6 C), Min:97.5 F (36.4 C), Max:98.5 F (36.9 C)  Recent Labs  Lab 04/22/19 0413 04/23/19 0522 04/24/19 0401 04/25/19 0352 04/26/19 0234  WBC 8.6 8.8 7.6 8.2 9.2  CREATININE 10.65* 11.14* 6.20* 7.84* 5.16*  VANCORANDOM  --   --   --  82  --     Levester Fresh, PharmD, BCPS, BCCCP Clinical Pharmacist [phone]  Please check AMION for all [facility] numbers  04/26/2019 10:59 AM

## 2019-04-26 NOTE — Progress Notes (Signed)
Renal Navigator received call from Dr. Bhandari/Nephrologist to initiate referral for modality change from PD to OP HD. Renal Navigator spoke with patient and submitted referral to [facility] to initiate referral to OP HD at clinic in [place] [facility]. Patient states that there has been an issue with her Medicare part B, so Renal Navigator has submitted a Patient Insurability Assessment with her referral. Patient is very pleasant and appreciative of support and assistance. Renal Navigator will follow up on referral and assist with smooth discharge from hospital back to in-center HD.  Alphonzo Cruise Renal Navigator [phone]

## 2019-04-26 NOTE — Progress Notes (Signed)
[name] NEPHROLOGY PROGRESS NOTE  Assessment/ Plan: Pt is a 53 y.o. yo female CHF, DM, morbid obesity, ESRD on PD follows with Dr. Lowanda Foster admitted with abdominal pain with staph epi peritonitis.  #Staph epi PD peritonitis:  -Patient did not tolerate PD.  General surgery consulted for infected PD catheter, status post removal today. Continue IV vancomycin per pharmacy.  Patient will need total 3 weeks of antibiotics.  #ESRD on PD: Status post dialysis yesterday.  Plan for next treatment tomorrow.  CLIP in progress. -left AV fistula has good thrill and bruit.   #Constipation: Reports good bowel movement with lactulose.  #Hypokalemia: Monitor electrolytes. Improved, dc KCL.   #Secondary hyperparathyroidism: Phosphorus level level, discontinue binders.  #Hypotension: Start midodrine twice daily.  Subjective: Seen and examined at bedside.  Status post removal of PD catheter.  Feeling good.  Denied nausea, vomiting, chest pain, shortness of breath or abdominal pain.  Objective Vital signs in last 24 hours: Vitals:   04/26/19 0925 04/26/19 0930 04/26/19 0940 04/26/19 0945  BP: (!) 107/58  106/63   Pulse:  60 (!) 59 65  Resp: _0 Temp:   97.7 F (36.5 C)   TempSrc:      SpO2:  100% 100% 100%  Weight:      Height:       Weight change: -1.4 kg  Intake/Output Summary (Last 24 hours) at 04/26/2019 1252 Last data filed at 04/26/2019 0948 Gross per 24 hour  Intake 1100 ml  Output 1711 ml  Net -611 ml       Labs: Basic Metabolic Panel: Recent Labs  Lab 04/24/19 0401 04/25/19 0352 04/26/19 0234  NA 136 138 138  K 3.7 3.8 4.2  CL 98 98 102  CO2 _1 GLUCOSE 90 100* 137*  BUN 19 26* 13  CREATININE 6.20* 7.84* 5.16*  CALCIUM 8.1* 8.8* 8.4*  PHOS 3.5 4.4 2.6   Liver Function Tests: Recent Labs  Lab 04/20/19 0229 04/25/19 0352 04/26/19 0234  AST 14*  --   --   ALT 14  --   --   ALKPHOS 136*  --   --   BILITOT 0.6  --   --   PROT  6.5  --   --   ALBUMIN 2.3* 1.8* 2.0*   Recent Labs  Lab 04/20/19 0229  LIPASE 24   No results for input(s): AMMONIA in the last 168 hours. CBC: Recent Labs  Lab 04/22/19 0413 04/23/19 0522 04/24/19 0401 04/25/19 0352 04/26/19 0234  WBC 8.6 8.8 7.6 8.2 9.2  NEUTROABS 6.5 6.5 5.2 5.2 6.1  HGB 10.2* 10.4* 9.9* 9.2* 9.9*  HCT 31.8* 31.9* 30.6* 29.3* 32.4*  MCV 98.1 98.5 98.1 98.7 100.6*  PLT 286 275 253 247 244   Cardiac Enzymes: No results for input(s): CKTOTAL, CKMB, CKMBINDEX, TROPONINI in the last 168 hours. CBG: Recent Labs  Lab 04/25/19 1609 04/25/19 2224 04/26/19 0609 04/26/19 0856 04/26/19 1143  GLUCAP 81 125* 119* 113* 113*    Iron Studies: No results for input(s): IRON, TIBC, TRANSFERRIN, FERRITIN in the last 72 hours. Studies/Results: No results found.  Medications: Infusions: . sodium chloride    . sodium chloride    . dialysis solution 1.5% low-MG/low-CA    . dialysis solution 2.5% low-MG/low-CA    . vancomycin 1,000 mg (04/26/19 1221)    Scheduled Medications: . calcitRIOL  0.5 mcg Oral Daily  . Chlorhexidine Gluconate Cloth  6 each Topical Q0600  .  docusate sodium  100 mg Oral BID  . fentaNYL      . gentamicin cream  1 application Topical Daily  . heparin  5,000 Units Subcutaneous Q8H  . insulin aspart  0-5 Units Subcutaneous QHS  . insulin aspart  0-9 Units Subcutaneous TID WC  . insulin detemir  3 Units Subcutaneous QHS  . lactulose  30 g Oral BID  . lanthanum  1,000 mg Oral With snacks  . lanthanum  1,500 mg Oral TID WC  . midodrine  10 mg Oral BID WC  . multivitamin  1 tablet Oral QHS  . oxyCODONE      . potassium chloride  40 mEq Oral Daily  . vancomycin variable dose per unstable renal function (pharmacist dosing)   Does not apply See admin instructions    have reviewed scheduled and prn medications.  Physical Exam: General:NAD, comfortable Heart:RRR, s1s2 nl, no rub Lungs: Clear bilateral, no wheezing or  crackles Abdomen:soft, PD catheter removed and has dressing on. Extremities: Chronic skin changes with chronic edema Dialysis Access: Left upper extremity aVF with good thrill and bruit. Neurology: Alert awake and oriented.  [name] 04/26/2019,12:52 PM  LOS: 6 days

## 2019-04-26 NOTE — Progress Notes (Addendum)
[facility]                                                                              Patient Demographics  [name], is a 53 y.o. female, DOB - [birth year], UUE:[id number]  Admit date - 04/20/2019   Admitting Physician Marcell Anger, MD  Outpatient Primary MD for the patient is [name], MD  Outpatient specialists:   LOS - 6  days   Medical records reviewed and are as summarized below:    Chief Complaint  Patient presents with   Abdominal Pain       Brief summary    Patient is a 53 year old female with anxiety, chronic diastolic heart failure, history of fibromyalgia, type 2 diabetes mellitus chronically on insulin with nephropathy, chronic respiratory failure, morbid obesity with hypoventilation syndromeandend-stage renal disease on peritoneal dialysis;who presented to the emergency department secondary to acute development of severe abdominal pain (left lower quadrant), 9/10 with associated nausea and vomiting. Patient reports partially being able to initiate her nocturnal peritoneal dialysis treatment.Patient also expressed some ongoing constipation and decrease in her appetite. In the ED patient was found with a low blood pressure, elevated WBCsandCT scan with some improvement in her recent fluid collection and peritonitis changes;no fever. IV fluids were given, cultures taken, IV antibiotics started. Patient was admitted for further work-up.  Assessment & Plan    Principal Problem: Abdominal pain with peritonitis in the setting of peritoneal dialysis/ESRD -PD cultures shows staph epidermidis -Given persistence of abdominal pain, CT scan was obtained which showed persistent 2.2 cm collection at the entry site of catheter and surrounding tenderness -General surgery consulted, infected PD catheter removed today. -Continue IV vancomycin, patient will need to continue hemodialysis, states she tolerates HD for now. Will need  CLIP/outpatient dialysis center.   ESRD on PD -PD catheter removed, patient will need to undergo hemodialysis center. Will need outpatient dialysis center arranged prior to discharge. Nephrology following.  Constipation -Improved with lactulose   Diabetes mellitus type 2 with renal complication -Hemoglobin A1c 7.1, -CBGs controlled, continue sliding scale insulin, Lantus.    Morbid obesity BMI 47, recommended continue exercise, diet control  Essential hypertension -On presentation BP low likely due to peritonitis now improving -BP still soft but stable, continue HD, holding antihypertensives.  Hyperlipidemia -Not on statins currently  History of chronic respiratory failure, OHS -Stable, O2 sats 100% on 2 L.   -Chronically at home on 2 L O2   Chronic diastolic CHF -Currently stable, continue HD for volume management  Anxiety -Currently stable, no acute issues  Code Status: Full code DVT Prophylaxis: Heparin subcu Family Communication: Discussed in detail with the patient, all imaging results, lab results explained to the patient and husband on the phone   Disposition Plan: PD catheter removed, will need hemodialysis outpatient center arranged prior to discharge.  Time Spent in minutes 25 minutes  Procedures:  Hemodialysis Removal of PD catheter on 04/26/2019  Consultants:   Nephrology General surgery  Antimicrobials:   Anti-infectives (From admission, onward)   Start     Dose/Rate Route Frequency Ordered Stop   04/26/19 1100  vancomycin (VANCOCIN) IVPB 1000  mg/200 mL premix     1,000 mg 200 mL/hr over 60 Minutes Intravenous  Once 04/26/19 1058     04/23/19 1700  vancomycin (VANCOCIN) IVPB 1000 mg/200 mL premix     1,000 mg 200 mL/hr over 60 Minutes Intravenous  Once 04/23/19 1437 04/23/19 1925   04/21/19 2200  piperacillin-tazobactam (ZOSYN) IVPB 2.25 g  Status:  Discontinued     2.25 g 100 mL/hr over 30 Minutes Intravenous Every 12 hours 04/21/19 1127  04/22/19 1137   04/20/19 1730  vancomycin (VANCOCIN) 2,500 mg in sodium chloride 0.9 % 500 mL IVPB     2,500 mg 250 mL/hr over 120 Minutes Intravenous  Once 04/20/19 1633 04/20/19 1927   04/20/19 1645  vancomycin variable dose per unstable renal function (pharmacist dosing)      Does not apply See admin instructions 04/20/19 1645     04/20/19 1000  piperacillin-tazobactam (ZOSYN) IVPB 3.375 g  Status:  Discontinued     3.375 g 12.5 mL/hr over 240 Minutes Intravenous Every 12 hours 04/20/19 0911 04/21/19 1127   04/20/19 0500  ceFEPIme (MAXIPIME) 2 g in sodium chloride 0.9 % 100 mL IVPB     2 g 200 mL/hr over 30 Minutes Intravenous  Once 04/20/19 0447 04/20/19 0609         Medications  Scheduled Meds:  calcitRIOL  0.5 mcg Oral Daily   Chlorhexidine Gluconate Cloth  6 each Topical Q0600   docusate sodium  100 mg Oral BID   fentaNYL       gentamicin cream  1 application Topical Daily   heparin  5,000 Units Subcutaneous Q8H   insulin aspart  0-5 Units Subcutaneous QHS   insulin aspart  0-9 Units Subcutaneous TID WC   insulin detemir  3 Units Subcutaneous QHS   lactulose  30 g Oral BID   lanthanum  1,000 mg Oral With snacks   lanthanum  1,500 mg Oral TID WC   midodrine  10 mg Oral BID WC   multivitamin  1 tablet Oral QHS   oxyCODONE       potassium chloride  40 mEq Oral Daily   vancomycin variable dose per unstable renal function (pharmacist dosing)   Does not apply See admin instructions   Continuous Infusions:  sodium chloride     sodium chloride     dialysis solution 1.5% low-MG/low-CA     dialysis solution 2.5% low-MG/low-CA     vancomycin     PRN Meds:.sodium chloride, sodium chloride, acetaminophen, albuterol, ALPRAZolam, dianeal solution for CAPD/CCPD with heparin, dianeal solution for CAPD/CCPD with heparin, HYDROmorphone (DILAUDID) injection, lidocaine (PF), lidocaine-prilocaine, ondansetron (ZOFRAN) IV, oxyCODONE-acetaminophen,  pentafluoroprop-tetrafluoroeth      Subjective:   [name] was seen and examined today.  Seen in PACU, after surgery, feels better.  No acute issues.  No significant abdominal pain or chest pain.  Feeling somewhat nauseous. Patient denies dizziness, chest pain, shortness of breath. No acute events overnight.  Afebrile  Objective:   Vitals:   04/26/19 0925 04/26/19 0930 04/26/19 0940 04/26/19 0945  BP: (!) 107/58  106/63   Pulse:  60 (!) 59 65  Resp: _0 Temp:   97.7 F (36.5 C)   TempSrc:      SpO2:  100% 100% 100%  Weight:      Height:        Intake/Output Summary (Last 24 hours) at 04/26/2019 1215 Last data filed at 04/26/2019 0948 Gross per 24 hour  Intake 1100  ml  Output 1711 ml  Net -611 ml     Wt Readings from Last 3 Encounters:  04/26/19 (!) 141.5 kg  01/04/19 (!) 158.3 kg  07/13/18 (!) 166.9 kg   Physical Exam  General: Alert and oriented x3, NAD  Eyes:   HEENT:  Atraumatic, normocephalic  Cardiovascular: S1 S2 clear, RRR. No pedal edema b/l  Respiratory: CTAB, no wheezing, rales or rhonchi  Gastrointestinal: Soft, PD catheter removed, dressing intact  Ext: no pedal edema bilaterally  Neuro: no new deficits  Musculoskeletal: No cyanosis, clubbing  Skin: No rashes  Psych: Normal affect and demeanor, alert and oriented x3      Data Reviewed:  I have personally reviewed following labs and imaging studies  Micro Results Recent Results (from the past 240 hour(s))  Gram stain     Status: None   Collection Time: 04/20/19  2:30 AM  Result Value Ref Range Status   Specimen Description PERITONEAL DIALYSATE  Final   Special Requests Normal  Final   Gram Stain   Final    CYTOSPIN SMEAR WBC PRESENT, PREDOMINANTLY PMN NO ORGANISMS SEEN RESULT CALLED TO, READ BACK BY AND VERIFIED WITH: J SAPPELT,RN _0  04/20/19 [facility] Performed at [facility], [address], [place], [place] [address]    Report Status 04/20/2019 FINAL  Final    Culture, body fluid-bottle     Status: Abnormal   Collection Time: 04/20/19  2:30 AM  Result Value Ref Range Status   Specimen Description   Final    PERITONEAL DIALYSATE Performed at [facility], [address], [place], [place] [address]    Special Requests   Final    PERITONEAL DIALYSATE Performed at [facility], [address] [place], [place] [address]    Gram Stain   Final    GRAM POSITIVE COCCI ANAEROBIC BOTTLE Gram Stain Report Called to,Read Back By and Verified With: M. MUELLER _1   04/20/19 [facility] Performed at [facility], [address], [place], [place] [address]    Culture STAPHYLOCOCCUS EPIDERMIDIS (A)  Final   Report Status 04/22/2019 FINAL  Final   Organism ID, Bacteria STAPHYLOCOCCUS EPIDERMIDIS  Final      Susceptibility   Staphylococcus epidermidis - MIC*    CIPROFLOXACIN <=0.5 SENSITIVE Sensitive     ERYTHROMYCIN >=8 RESISTANT Resistant     GENTAMICIN <=0.5 SENSITIVE Sensitive     OXACILLIN >=4 RESISTANT Resistant     TETRACYCLINE 2 SENSITIVE Sensitive     VANCOMYCIN 2 SENSITIVE Sensitive     TRIMETH/SULFA <=10 SENSITIVE Sensitive     CLINDAMYCIN <=0.25 SENSITIVE Sensitive     RIFAMPIN <=0.5 SENSITIVE Sensitive     Inducible Clindamycin NEGATIVE Sensitive     * STAPHYLOCOCCUS EPIDERMIDIS  Stat Gram stain     Status: None   Collection Time: 04/21/19  1:31 AM  Result Value Ref Range Status   Specimen Description PERITONEAL FLUID  Final   Special Requests NONE  Final   Gram Stain   Final    WBC PRESENT, PREDOMINANTLY PMN NO ORGANISMS SEEN CYTOSPIN SMEAR Performed at [facility], [address], [place],  [address]    Report Status 04/21/2019 FINAL  Final  Culture, body fluid-bottle     Status: None   Collection Time: 04/21/19  1:33 AM  Result Value Ref Range Status   Specimen Description PERITONEAL FLUID  Final   Special Requests   Final    BOTTLES DRAWN AEROBIC AND ANAEROBIC Blood Culture adequate volume  Culture   Final    NO  GROWTH 5 DAYS Performed at [facility], [place] [address], [place], [place] [address]    Report Status 04/26/2019 FINAL  Final  MRSA PCR Screening     Status: None   Collection Time: 04/24/19 11:38 AM  Result Value Ref Range Status   MRSA by PCR NEGATIVE NEGATIVE Final    Comment:        The GeneXpert MRSA Assay (FDA approved for NASAL specimens only), is one component of a comprehensive MRSA colonization surveillance program. It is not intended to diagnose MRSA infection nor to guide or monitor treatment for MRSA infections. Performed at [facility], [place] [address] [place], [place] [address]   Surgical pcr screen     Status: None   Collection Time: 04/26/19  5:07 AM  Result Value Ref Range Status   MRSA, PCR NEGATIVE NEGATIVE Final   Staphylococcus aureus NEGATIVE NEGATIVE Final    Comment: (NOTE) The Xpert SA Assay (FDA approved for NASAL specimens in patients 15 years of age and older), is one component of a comprehensive surveillance program. It is not intended to diagnose infection nor to guide or monitor treatment. Performed at [facility], [place] [address], [place], [place] [address]     Radiology Reports Ct Abdomen Pelvis Wo Contrast  Result Date: 04/20/2019 CLINICAL DATA:  Initial evaluation for acute abdominal pain, shortness of breath while undergoing peritoneal dialysis. EXAM: CT ABDOMEN AND PELVIS WITHOUT CONTRAST TECHNIQUE: Multidetector CT imaging of the abdomen and pelvis was performed following the standard protocol without IV contrast. COMPARISON:  Prior CT from 12/27/2018. FINDINGS: Lower chest: Atelectatic changes seen dependently within the visualized lung bases. Visualized lungs are otherwise clear. Hepatobiliary: Liver demonstrates a normal unenhanced appearance. Gallbladder absent. No biliary dilatation. Pancreas: Pancreas within normal limits. Spleen: Spleen within normal limits. Adrenals/Urinary Tract: Adrenal glands are normal. Kidneys  equal in size without nephrolithiasis or hydronephrosis. No hydroureter. Bladder decompressed without abnormality. Stomach/Bowel: Stomach within normal limits. No evidence for bowel obstruction. No evidence for acute appendicitis. Mild colonic diverticulosis without evidence for acute diverticulitis. No acute inflammatory changes seen about the bowels. Vascular/Lymphatic: Intra-abdominal aorta of normal caliber. Mild aorto bi-iliac atherosclerotic disease. No adenopathy. Reproductive: Prior hysterectomy.  No adnexal mass. Other: Peritoneal dialysis catheter in place extending via the left lower quadrant, tip position within the left lower quadrant stable from previous. Persistent 2.2 cm collection at the entry site of the catheter, slightly decreased from previous (series 2, image 68). Persistent soft tissue stranding within the partially visualized adjacent abdominal wall extending into the pannus, suggesting persistent cellulitis/panniculitis, improved in appearance relative to previous exam. No soft tissue emphysema. Scattered free air with trace free fluid within the abdomen compatible with dialysis. Musculoskeletal: No acute osseous abnormality. No discrete lytic or blastic osseous lesions. IMPRESSION: 1. Persistent soft tissue stranding within the left anterior abdominal wall and pannus, suggesting possible cellulitis/panniculitis, improved in appearance relative to most recent CT from 12/27/2018. Persistent 2.2 cm collection at the peritoneal dialysis catheter in treat point, slightly decreased from previous as well. 2. No other acute intra-abdominal or pelvic process. 3. Free intraperitoneal air, compatible with peritoneal dialysis. 4. Mild colonic diverticulosis without evidence for acute diverticulitis. Electronically Signed   By: Jeannine Boga M.D.   On: 04/20/2019 04:32   Dg Abd 2 Views  Result Date: 04/23/2019 CLINICAL DATA:  Abdominal pain and vomiting. EXAM: ABDOMEN - 2 VIEW COMPARISON:  CT  abdomen  and pelvis 04/20/2019. Plain films of the abdomen 12/30/2018. FINDINGS: No free intraperitoneal air is identified. The bowel gas pattern is nonobstructive. Radiopaque densities projecting in the right abdomen could be retained contrast in diverticula or medicine. IMPRESSION: No acute finding. Electronically Signed   By: Inge Rise M.D.   On: 04/23/2019 12:09    Lab Data:  CBC: Recent Labs  Lab 04/22/19 0413 04/23/19 0522 04/24/19 0401 04/25/19 0352 04/26/19 0234  WBC 8.6 8.8 7.6 8.2 9.2  NEUTROABS 6.5 6.5 5.2 5.2 6.1  HGB 10.2* 10.4* 9.9* 9.2* 9.9*  HCT 31.8* 31.9* 30.6* 29.3* 32.4*  MCV 98.1 98.5 98.1 98.7 100.6*  PLT 286 275 253 247 062   Basic Metabolic Panel: Recent Labs  Lab 04/20/19 0229 04/20/19 1228  04/22/19 0413 04/23/19 0522 04/24/19 0401 04/25/19 0352 04/26/19 0234  NA 131*  --    < > 137 138 136 138 138  K 3.0*  --    < > 3.4* 3.4* 3.7 3.8 4.2  CL 90*  --    < > 100 104 98 98 102  CO2 25  --    < > 20* 19* _0 GLUCOSE 177*  --    < > 80 79 90 100* 137*  BUN 41*  --    < > 48* 49* 19 26* 13  CREATININE 9.49* 9.27*   < > 10.65* 11.14* 6.20* 7.84* 5.16*  CALCIUM 8.1*  --    < > 8.4* 8.1* 8.1* 8.8* 8.4*  MG 1.5* 1.8  --   --  2.1  --   --   --   PHOS  --  5.7*  --   --   --  3.5 4.4 2.6   < > = values in this interval not displayed.   GFR: Estimated Creatinine Clearance: 18.3 mL/min (A) (by C-G formula based on SCr of 5.16 mg/dL (H)). Liver Function Tests: Recent Labs  Lab 04/20/19 0229 04/25/19 0352 04/26/19 0234  AST 14*  --   --   ALT 14  --   --   ALKPHOS 136*  --   --   BILITOT 0.6  --   --   PROT 6.5  --   --   ALBUMIN 2.3* 1.8* 2.0*   Recent Labs  Lab 04/20/19 0229  LIPASE 24   No results for input(s): AMMONIA in the last 168 hours. Coagulation Profile: No results for input(s): INR, PROTIME in the last 168 hours. Cardiac Enzymes: No results for input(s): CKTOTAL, CKMB, CKMBINDEX, TROPONINI in the last 168  hours. BNP (last 3 results) No results for input(s): PROBNP in the last 8760 hours. HbA1C: No results for input(s): HGBA1C in the last 72 hours. CBG: Recent Labs  Lab 04/25/19 1609 04/25/19 2224 04/26/19 0609 04/26/19 0856 04/26/19 1143  GLUCAP 81 125* 119* 113* 113*   Lipid Profile: No results for input(s): CHOL, HDL, LDLCALC, TRIG, CHOLHDL, LDLDIRECT in the last 72 hours. Thyroid Function Tests: No results for input(s): TSH, T4TOTAL, FREET4, T3FREE, THYROIDAB in the last 72 hours. Anemia Panel: No results for input(s): VITAMINB12, FOLATE, FERRITIN, TIBC, IRON, RETICCTPCT in the last 72 hours. Urine analysis:    Component Value Date/Time   COLORURINE YELLOW 08/11/2016 1800   APPEARANCEUR HAZY (A) 08/11/2016 1800   LABSPEC 1.020 08/11/2016 1800   PHURINE 5.5 08/11/2016 1800   GLUCOSEU NEGATIVE 08/11/2016 1800   HGBUR MODERATE (A) 08/11/2016 1800   BILIRUBINUR NEGATIVE 08/11/2016 1800   KETONESUR NEGATIVE 08/11/2016  1800   PROTEINUR 100 (A) 08/11/2016 1800   UROBILINOGEN 0.2 12/02/2014 1925   NITRITE NEGATIVE 08/11/2016 1800   LEUKOCYTESUR NEGATIVE 08/11/2016 1800     Satin Boal M.D. [facility] 04/26/2019, 12:15 PM  Pager: [phone] Between 7am to 7pm - call Pager - [phone]  After 7pm go to [email or web address] - password [email or web address]  Call night coverage person covering after 7pm

## 2019-04-27 ENCOUNTER — Encounter (HOSPITAL_COMMUNITY): Payer: Self-pay | Admitting: Surgery

## 2019-04-27 LAB — RENAL FUNCTION PANEL
Albumin: 2 g/dL — ABNORMAL LOW (ref 3.5–5.0)
Albumin: 2 g/dL — ABNORMAL LOW (ref 3.5–5.0)
Anion gap: 9 (ref 5–15)
Anion gap: 11 (ref 5–15)
BUN: 18 mg/dL (ref 6–20)
BUN: 19 mg/dL (ref 6–20)
CO2: 26 mmol/L (ref 22–32)
CO2: 23 mmol/L (ref 22–32)
Calcium: 8.6 mg/dL — ABNORMAL LOW (ref 8.9–10.3)
Calcium: 8.7 mg/dL — ABNORMAL LOW (ref 8.9–10.3)
Chloride: 102 mmol/L (ref 98–111)
Chloride: 101 mmol/L (ref 98–111)
Creatinine, Ser: 6.44 mg/dL — ABNORMAL HIGH (ref 0.44–1.00)
Creatinine, Ser: 6.71 mg/dL — ABNORMAL HIGH (ref 0.44–1.00)
GFR calc Af Amer: 8 mL/min — ABNORMAL LOW (ref >60)
GFR calc Af Amer: 8 mL/min — ABNORMAL LOW (ref >60)
GFR calc non Af Amer: 7 mL/min — ABNORMAL LOW (ref >60)
GFR calc non Af Amer: 6 mL/min — ABNORMAL LOW (ref >60)
Glucose, Bld: 124 mg/dL — ABNORMAL HIGH (ref 70–99)
Glucose, Bld: 99 mg/dL (ref 70–99)
Phosphorus: 4.4 mg/dL (ref 2.5–4.6)
Phosphorus: 4.8 mg/dL — ABNORMAL HIGH (ref 2.5–4.6)
Potassium: 4.6 mmol/L (ref 3.5–5.1)
Potassium: 4.1 mmol/L (ref 3.5–5.1)
Sodium: 137 mmol/L (ref 135–145)
Sodium: 135 mmol/L (ref 135–145)

## 2019-04-27 LAB — GLUCOSE, CAPILLARY
Glucose-Capillary: 90 mg/dL (ref 70–99)
Glucose-Capillary: 112 mg/dL — ABNORMAL HIGH (ref 70–99)
Glucose-Capillary: 118 mg/dL — ABNORMAL HIGH (ref 70–99)
Glucose-Capillary: 128 mg/dL — ABNORMAL HIGH (ref 70–99)

## 2019-04-27 LAB — CBC
HCT: 30.4 % — ABNORMAL LOW (ref 36.0–46.0)
Hemoglobin: 9.6 g/dL — ABNORMAL LOW (ref 12.0–15.0)
MCH: 32.1 pg (ref 26.0–34.0)
MCHC: 31.6 g/dL (ref 30.0–36.0)
MCV: 101.7 fL — ABNORMAL HIGH (ref 80.0–100.0)
Platelets: 242 10*3/uL (ref 150–400)
RBC: 2.99 MIL/uL — ABNORMAL LOW (ref 3.87–5.11)
RDW: 15 % (ref 11.5–15.5)
WBC: 9.3 10*3/uL (ref 4.0–10.5)
nRBC: 0 % (ref 0.0–0.2)

## 2019-04-27 MED ORDER — LIDOCAINE-PRILOCAINE 2.5-2.5 % EX CREA: 1.0000 "application " | TOPICAL_CREAM | CUTANEOUS | Status: DC | PRN

## 2019-04-27 MED ORDER — MIDODRINE HCL 5 MG PO TABS
ORAL_TABLET | ORAL | Status: AC
  Filled 2019-04-27: qty 2

## 2019-04-27 MED ORDER — VANCOMYCIN HCL IN DEXTROSE 1-5 GM/200ML-% IV SOLN
1000.0000 mg | Freq: Once | INTRAVENOUS | Status: AC
  Administered 2019-04-27: 1000 mg via INTRAVENOUS
  Filled 2019-04-27: qty 200

## 2019-04-27 MED ORDER — PENTAFLUOROPROP-TETRAFLUOROETH EX AERO: 1.0000 "application " | INHALATION_SPRAY | CUTANEOUS | Status: DC | PRN

## 2019-04-27 MED ORDER — ALTEPLASE 2 MG IJ SOLR: 2.0000 mg | Freq: Once | INTRAMUSCULAR | Status: DC | PRN

## 2019-04-27 MED ORDER — SODIUM CHLORIDE 0.9 % IV SOLN: 100.0000 mL | INTRAVENOUS | Status: DC | PRN

## 2019-04-27 MED ORDER — OXYCODONE-ACETAMINOPHEN 5-325 MG PO TABS
ORAL_TABLET | ORAL | Status: AC
  Administered 2019-04-27: 1
  Filled 2019-04-27: qty 1

## 2019-04-27 MED ORDER — HEPARIN SODIUM (PORCINE) 1000 UNIT/ML DIALYSIS: 1000.0000 [IU] | INTRAMUSCULAR | Status: DC | PRN

## 2019-04-27 MED ORDER — HEPARIN SODIUM (PORCINE) 1000 UNIT/ML DIALYSIS: 1000.0000 [IU] | Freq: Once | INTRAMUSCULAR | Status: DC

## 2019-04-27 MED ORDER — LIDOCAINE HCL (PF) 1 % IJ SOLN: 5.0000 mL | INTRAMUSCULAR | Status: DC | PRN

## 2019-04-27 MED ORDER — CALCITRIOL 0.5 MCG PO CAPS
ORAL_CAPSULE | ORAL | Status: AC
  Administered 2019-04-27: 0.5 ug
  Filled 2019-04-27: qty 1

## 2019-04-27 NOTE — Progress Notes (Addendum)
[facility]                                                                              Patient Demographics  [name], is a 53 y.o. female, DOB - 1966/07/21, LLI:[id number]  Admit date - 04/20/2019   Admitting Physician Marcell Anger, MD  Outpatient Primary MD for the patient is [name], MD  Outpatient specialists:   LOS - 7  days   Medical records reviewed and are as summarized below:    Chief Complaint  Patient presents with  . Abdominal Pain       Brief summary    Patient is a 53 year old female with anxiety, chronic diastolic heart failure, history of fibromyalgia, type 2 diabetes mellitus chronically on insulin with nephropathy, chronic respiratory failure, morbid obesity with hypoventilation syndromeandend-stage renal disease on peritoneal dialysis;who presented to the emergency department secondary to acute development of severe abdominal pain (left lower quadrant), 9/10 with associated nausea and vomiting. Patient reports partially being able to initiate her nocturnal peritoneal dialysis treatment.Patient also expressed some ongoing constipation and decrease in her appetite. In the ED patient was found with a low blood pressure, elevated WBCsandCT scan with some improvement in her recent fluid collection and peritonitis changes;no fever. IV fluids were given, cultures taken, IV antibiotics started. Patient was admitted for further work-up.  Assessment & Plan    Principal Problem: Abdominal pain with peritonitis in the setting of peritoneal dialysis/ESRD -PD cultures shows staph epidermidis -Given persistence of abdominal pain, CT scan was obtained which showed persistent 2.2 cm collection at the entry site of catheter and surrounding tenderness -General surgery consulted, infected PD catheter removed on 5/7 -Transitioned to hemodialysis, will need CLIP/outpatient dialysis center.  -Continue IV vancomycin , will need 3  weeks of antibiotics  ESRD on PD -PD catheter removed, patient will need to undergo hemodialysis center. Will need outpatient dialysis center arranged prior to discharge. Nephrology following.  Constipation -Improved with lactulose   Diabetes mellitus type 2 with renal complication -Hemoglobin A1c 7.1, -CBGs stable, continue Lantus, sliding scale insulin   Morbid obesity BMI 47, recommended continue exercise, diet control  Essential hypertension -On presentation BP low likely due to peritonitis now improving -BP soft, stable, continue to hold antihypertensives.    Hyperlipidemia -Not on statins currently  History of chronic respiratory failure, OHS -Stable, O2 sats 100% on 2 L -Chronically at home on 2 L O2   Chronic diastolic CHF -Currently stable, continue HD for volume management  Anxiety -Currently stable, no acute issues  Code Status: Full code DVT Prophylaxis: Heparin subcu Family Communication: Discussed in detail with the patient, all imaging results, lab results explained to the patient and patient's husband on the phone   Disposition Plan: PD catheter removed, will need hemodialysis outpatient center arranged prior to discharge.  Time Spent in minutes 25 minutes  Procedures:  Hemodialysis Removal of PD catheter on 04/26/2019  Consultants:   Nephrology General surgery  Antimicrobials:   Anti-infectives (From admission, onward)   Start     Dose/Rate Route Frequency Ordered Stop   04/26/19 1100  vancomycin (VANCOCIN) IVPB 1000 mg/200 mL premix  1,000 mg 200 mL/hr over 60 Minutes Intravenous  Once 04/26/19 1058 04/26/19 1321   04/23/19 1700  vancomycin (VANCOCIN) IVPB 1000 mg/200 mL premix     1,000 mg 200 mL/hr over 60 Minutes Intravenous  Once 04/23/19 1437 04/23/19 1925   04/21/19 2200  piperacillin-tazobactam (ZOSYN) IVPB 2.25 g  Status:  Discontinued     2.25 g 100 mL/hr over 30 Minutes Intravenous Every 12 hours 04/21/19 1127 04/22/19  1137   04/20/19 1730  vancomycin (VANCOCIN) 2,500 mg in sodium chloride 0.9 % 500 mL IVPB     2,500 mg 250 mL/hr over 120 Minutes Intravenous  Once 04/20/19 1633 04/20/19 1927   04/20/19 1645  vancomycin variable dose per unstable renal function (pharmacist dosing)      Does not apply See admin instructions 04/20/19 1645     04/20/19 1000  piperacillin-tazobactam (ZOSYN) IVPB 3.375 g  Status:  Discontinued     3.375 g 12.5 mL/hr over 240 Minutes Intravenous Every 12 hours 04/20/19 0911 04/21/19 1127   04/20/19 0500  ceFEPIme (MAXIPIME) 2 g in sodium chloride 0.9 % 100 mL IVPB     2 g 200 mL/hr over 30 Minutes Intravenous  Once 04/20/19 0447 04/20/19 0609         Medications  Scheduled Meds: . calcitRIOL  0.5 mcg Oral Daily  . Chlorhexidine Gluconate Cloth  6 each Topical Q0600  . docusate sodium  100 mg Oral BID  . gentamicin cream  1 application Topical Daily  . heparin  5,000 Units Subcutaneous Q8H  . insulin aspart  0-5 Units Subcutaneous QHS  . insulin aspart  0-9 Units Subcutaneous TID WC  . insulin detemir  3 Units Subcutaneous QHS  . lactulose  30 g Oral BID  . midodrine      . midodrine  10 mg Oral BID WC  . multivitamin  1 tablet Oral QHS  . vancomycin variable dose per unstable renal function (pharmacist dosing)   Does not apply See admin instructions   Continuous Infusions:  PRN Meds:.acetaminophen, albuterol, ALPRAZolam, HYDROmorphone (DILAUDID) injection, ondansetron (ZOFRAN) IV, oxyCODONE-acetaminophen      Subjective:   [name] was seen and examined today.  Seen during HD, has mild diffuse abdominal pain per patient better since peritoneal dialysis catheter has removed.  No fevers or chills.   Patient denies dizziness, chest pain, shortness of breath. No acute events overnight.  Afebrile  Objective:   Vitals:   04/27/19 0930 04/27/19 1030 04/27/19 1100 04/27/19 1145  BP: (!) 98/55 (!) 103/57 (!) 92/59 (!) 101/55  Pulse: 71 69 69 70  Resp:     (!) 22  Temp:    98 F (36.7 C)  TempSrc:    Oral  SpO2:    100%  Weight:    (!) 141.7 kg  Height:        Intake/Output Summary (Last 24 hours) at 04/27/2019 1330 Last data filed at 04/27/2019 1145 Gross per 24 hour  Intake -  Output 2000 ml  Net -2000 ml     Wt Readings from Last 3 Encounters:  04/27/19 (!) 141.7 kg  01/04/19 (!) 158.3 kg  07/13/18 (!) 166.9 kg    Physical Exam  General: Alert and oriented x 3, NAD  Eyes:   HEENT:    Cardiovascular: S1 S2 clear, no murmurs, RRR. No pedal edema b/l  Respiratory: CTAB, no wheezing, rales or rhonchi  Gastrointestinal: Soft, mild diffuse abdominal tenderness, nondistended, NBS  Ext: no pedal edema bilaterally  Neuro: no new deficits  Musculoskeletal: No cyanosis, clubbing  Skin: No rashes  Psych: Normal affect and demeanor, alert and oriented x3       Data Reviewed:  I have personally reviewed following labs and imaging studies  Micro Results Recent Results (from the past 240 hour(s))  Gram stain     Status: None   Collection Time: 04/20/19  2:30 AM  Result Value Ref Range Status   Specimen Description PERITONEAL DIALYSATE  Final   Special Requests Normal  Final   Gram Stain   Final    CYTOSPIN SMEAR WBC PRESENT, PREDOMINANTLY PMN NO ORGANISMS SEEN RESULT CALLED TO, READ BACK BY AND VERIFIED WITH: J SAPPELT,RN _0  04/20/19 [facility] Performed at [facility], [address] [place], [place] [address]    Report Status 04/20/2019 FINAL  Final  Culture, body fluid-bottle     Status: Abnormal   Collection Time: 04/20/19  2:30 AM  Result Value Ref Range Status   Specimen Description   Final    PERITONEAL DIALYSATE Performed at [facility], [address], [place], [place] [address]    Special Requests   Final    PERITONEAL DIALYSATE Performed at [facility], [address], [place], [place] [address]    Gram Stain   Final    GRAM POSITIVE COCCI ANAEROBIC BOTTLE Gram Stain Report Called to,Read Back  By and Verified With: M. MUELLER _1   04/20/19 [facility] Performed at [facility], [address] [place], [place] [address]    Culture STAPHYLOCOCCUS EPIDERMIDIS (A)  Final   Report Status 04/22/2019 FINAL  Final   Organism ID, Bacteria STAPHYLOCOCCUS EPIDERMIDIS  Final      Susceptibility   Staphylococcus epidermidis - MIC*    CIPROFLOXACIN <=0.5 SENSITIVE Sensitive     ERYTHROMYCIN >=8 RESISTANT Resistant     GENTAMICIN <=0.5 SENSITIVE Sensitive     OXACILLIN >=4 RESISTANT Resistant     TETRACYCLINE 2 SENSITIVE Sensitive     VANCOMYCIN 2 SENSITIVE Sensitive     TRIMETH/SULFA <=10 SENSITIVE Sensitive     CLINDAMYCIN <=0.25 SENSITIVE Sensitive     RIFAMPIN <=0.5 SENSITIVE Sensitive     Inducible Clindamycin NEGATIVE Sensitive     * STAPHYLOCOCCUS EPIDERMIDIS  Stat Gram stain     Status: None   Collection Time: 04/21/19  1:31 AM  Result Value Ref Range Status   Specimen Description PERITONEAL FLUID  Final   Special Requests NONE  Final   Gram Stain   Final    WBC PRESENT, PREDOMINANTLY PMN NO ORGANISMS SEEN CYTOSPIN SMEAR Performed at [facility], [address] [place], [place] [address]    Report Status 04/21/2019 FINAL  Final  Culture, body fluid-bottle     Status: None   Collection Time: 04/21/19  1:33 AM  Result Value Ref Range Status   Specimen Description PERITONEAL FLUID  Final   Special Requests   Final    BOTTLES DRAWN AEROBIC AND ANAEROBIC Blood Culture adequate volume   Culture   Final    NO GROWTH 5 DAYS Performed at [facility], [place] [address] [place], [place] [address]    Report Status 04/26/2019 FINAL  Final  MRSA PCR Screening     Status: None   Collection Time: 04/24/19 11:38 AM  Result Value Ref Range Status   MRSA by PCR NEGATIVE NEGATIVE Final    Comment:        The GeneXpert MRSA Assay (FDA approved for NASAL specimens only), is  one component of a comprehensive MRSA colonization surveillance program. It is not intended to diagnose  MRSA infection nor to guide or monitor treatment for MRSA infections. Performed at [facility], [place] [address], [place], [place] [address]   Surgical pcr screen     Status: None   Collection Time: 04/26/19  5:07 AM  Result Value Ref Range Status   MRSA, PCR NEGATIVE NEGATIVE Final   Staphylococcus aureus NEGATIVE NEGATIVE Final    Comment: (NOTE) The Xpert SA Assay (FDA approved for NASAL specimens in patients 65 years of age and older), is one component of a comprehensive surveillance program. It is not intended to diagnose infection nor to guide or monitor treatment. Performed at [facility], [place] [address] [place], [place] [address]     Radiology Reports Ct Abdomen Pelvis Wo Contrast  Result Date: 04/20/2019 CLINICAL DATA:  Initial evaluation for acute abdominal pain, shortness of breath while undergoing peritoneal dialysis. EXAM: CT ABDOMEN AND PELVIS WITHOUT CONTRAST TECHNIQUE: Multidetector CT imaging of the abdomen and pelvis was performed following the standard protocol without IV contrast. COMPARISON:  Prior CT from 12/27/2018. FINDINGS: Lower chest: Atelectatic changes seen dependently within the visualized lung bases. Visualized lungs are otherwise clear. Hepatobiliary: Liver demonstrates a normal unenhanced appearance. Gallbladder absent. No biliary dilatation. Pancreas: Pancreas within normal limits. Spleen: Spleen within normal limits. Adrenals/Urinary Tract: Adrenal glands are normal. Kidneys equal in size without nephrolithiasis or hydronephrosis. No hydroureter. Bladder decompressed without abnormality. Stomach/Bowel: Stomach within normal limits. No evidence for bowel obstruction. No evidence for acute appendicitis. Mild colonic diverticulosis without evidence for acute diverticulitis. No acute inflammatory changes seen about the bowels. Vascular/Lymphatic: Intra-abdominal aorta of normal caliber. Mild aorto bi-iliac atherosclerotic disease. No adenopathy.  Reproductive: Prior hysterectomy.  No adnexal mass. Other: Peritoneal dialysis catheter in place extending via the left lower quadrant, tip position within the left lower quadrant stable from previous. Persistent 2.2 cm collection at the entry site of the catheter, slightly decreased from previous (series 2, image 68). Persistent soft tissue stranding within the partially visualized adjacent abdominal wall extending into the pannus, suggesting persistent cellulitis/panniculitis, improved in appearance relative to previous exam. No soft tissue emphysema. Scattered free air with trace free fluid within the abdomen compatible with dialysis. Musculoskeletal: No acute osseous abnormality. No discrete lytic or blastic osseous lesions. IMPRESSION: 1. Persistent soft tissue stranding within the left anterior abdominal wall and pannus, suggesting possible cellulitis/panniculitis, improved in appearance relative to most recent CT from 12/27/2018. Persistent 2.2 cm collection at the peritoneal dialysis catheter in treat point, slightly decreased from previous as well. 2. No other acute intra-abdominal or pelvic process. 3. Free intraperitoneal air, compatible with peritoneal dialysis. 4. Mild colonic diverticulosis without evidence for acute diverticulitis. Electronically Signed   By: Jeannine Boga M.D.   On: 04/20/2019 04:32   Dg Abd 2 Views  Result Date: 04/23/2019 CLINICAL DATA:  Abdominal pain and vomiting. EXAM: ABDOMEN - 2 VIEW COMPARISON:  CT abdomen and pelvis 04/20/2019. Plain films of the abdomen 12/30/2018. FINDINGS: No free intraperitoneal air is identified. The bowel gas pattern is nonobstructive. Radiopaque densities projecting in the right abdomen could be retained contrast in diverticula or medicine. IMPRESSION: No acute finding. Electronically Signed   By: Inge Rise M.D.   On: 04/23/2019 12:09    Lab Data:  CBC: Recent Labs  Lab 04/22/19 0413 04/23/19 0522 04/24/19 0401 04/25/19  0352 04/26/19 0234 04/27/19 0758  WBC 8.6 8.8 7.6 8.2 9.2 9.3  NEUTROABS 6.5 6.5 5.2 5.2 6.1  --   HGB 10.2* 10.4* 9.9* 9.2* 9.9* 9.6*  HCT 31.8* 31.9* 30.6* 29.3* 32.4* 30.4*  MCV 98.1 98.5 98.1 98.7 100.6* 101.7*  PLT 286 275 253 247 244 403   Basic Metabolic Panel: Recent Labs  Lab 04/23/19 0522 04/24/19 0401 04/25/19 0352 04/26/19 0234 04/27/19 0223 04/27/19 0758  NA 138 136 138 138 137 135  K 3.4* 3.7 3.8 4.2 4.6 4.1  CL 104 98 98 102 102 101  CO2 19* _0 GLUCOSE 79 90 100* 137* 124* 99  BUN 49* 19 26* _1 CREATININE 11.14* 6.20* 7.84* 5.16* 6.44* 6.71*  CALCIUM 8.1* 8.1* 8.8* 8.4* 8.6* 8.7*  MG 2.1  --   --   --   --   --   PHOS  --  3.5 4.4 2.6 4.4 4.8*   GFR: Estimated Creatinine Clearance: 14.1 mL/min (A) (by C-G formula based on SCr of 6.71 mg/dL (H)). Liver Function Tests: Recent Labs  Lab 04/25/19 0352 04/26/19 0234 04/27/19 0223 04/27/19 0758  ALBUMIN 1.8* 2.0* 2.0* 2.0*   No results for input(s): LIPASE, AMYLASE in the last 168 hours. No results for input(s): AMMONIA in the last 168 hours. Coagulation Profile: No results for input(s): INR, PROTIME in the last 168 hours. Cardiac Enzymes: No results for input(s): CKTOTAL, CKMB, CKMBINDEX, TROPONINI in the last 168 hours. BNP (last 3 results) No results for input(s): PROBNP in the last 8760 hours. HbA1C: No results for input(s): HGBA1C in the last 72 hours. CBG: Recent Labs  Lab 04/26/19 0856 04/26/19 1143 04/26/19 1634 04/26/19 2121 04/27/19 1235  GLUCAP 113* 113* 125* 141* 112*   Lipid Profile: No results for input(s): CHOL, HDL, LDLCALC, TRIG, CHOLHDL, LDLDIRECT in the last 72 hours. Thyroid Function Tests: No results for input(s): TSH, T4TOTAL, FREET4, T3FREE, THYROIDAB in the last 72 hours. Anemia Panel: No results for input(s): VITAMINB12, FOLATE, FERRITIN, TIBC, IRON, RETICCTPCT in the last 72 hours. Urine analysis:    Component Value Date/Time   COLORURINE  YELLOW 08/11/2016 1800   APPEARANCEUR HAZY (A) 08/11/2016 1800   LABSPEC 1.020 08/11/2016 1800   PHURINE 5.5 08/11/2016 1800   GLUCOSEU NEGATIVE 08/11/2016 1800   HGBUR MODERATE (A) 08/11/2016 1800   BILIRUBINUR NEGATIVE 08/11/2016 1800   KETONESUR NEGATIVE 08/11/2016 1800   PROTEINUR 100 (A) 08/11/2016 1800   UROBILINOGEN 0.2 12/02/2014 1925   NITRITE NEGATIVE 08/11/2016 1800   LEUKOCYTESUR NEGATIVE 08/11/2016 1800     Aariz Maish M.D. [facility] 04/27/2019, 1:30 PM  Pager: [phone] Between 7am to 7pm - call Pager - [phone]  After 7pm go to [email or web address] - password [email or web address]  Call night coverage person covering after 7pm

## 2019-04-27 NOTE — Progress Notes (Signed)
[facility] Progress Note  1 Day Post-Op  Subjective: CC-  In HD this morning. States that her abdomen is sore but overall pain well controlled. Mild nausea over night, no emesis. Denies n/v this morning. Tolerating dinner last night. Passing flatus. Last BM 5/6.  Objective: Vital signs in last 24 hours: Temp:  [97.5 F (36.4 C)-97.9 F (36.6 C)] 97.9 F (36.6 C) (05/07 2118) Pulse Rate:  [59-70] 66 (05/08 0646) Resp:  [14-20] 20 (05/07 2118) BP: (91-110)/(53-65) 91/55 (05/08 0646) SpO2:  [94 %-100 %] 100 % (05/08 0646) Last BM Date: 04/24/19  Intake/Output from previous day: 05/07 0701 - 05/08 0700 In: 620 [P.O.:120; I.V.:500] Out: 10 [Blood:10] Intake/Output this shift: No intake/output data recorded.  PE: Gen:  Alert, NAD, pleasant HEENT: EOM's intact, pupils equal and round Pulm:  effort normal Abd: obese, soft, appropriately tender, 4 separate open incisions with packing strips in place/ bloody drainage noted on dressings/ no cellulitis or purulent drainage Skin: warm and dry  Lab Results:  Recent Labs    04/26/19 0234 04/27/19 0758  WBC 9.2 9.3  HGB 9.9* 9.6*  HCT 32.4* 30.4*  PLT 244 242   BMET Recent Labs    04/27/19 0223 04/27/19 0758  NA 137 135  K 4.6 4.1  CL 102 101  CO2 26 23  GLUCOSE 124* 99  BUN 18 19  CREATININE 6.44* 6.71*  CALCIUM 8.6* 8.7*   PT/INR No results for input(s): LABPROT, INR in the last 72 hours. CMP     Component Value Date/Time   NA 135 04/27/2019 0758   K 4.1 04/27/2019 0758   CL 101 04/27/2019 0758   CO2 23 04/27/2019 0758   GLUCOSE 99 04/27/2019 0758   BUN 19 04/27/2019 0758   CREATININE 6.71 (H) 04/27/2019 0758   CALCIUM 8.7 (L) 04/27/2019 0758   CALCIUM 8.5 09/06/2012 1056   PROT 6.5 04/20/2019 0229   ALBUMIN 2.0 (L) 04/27/2019 0758   AST 14 (L) 04/20/2019 0229   ALT 14 04/20/2019 0229   ALKPHOS 136 (H) 04/20/2019 0229   BILITOT 0.6 04/20/2019 0229   GFRNONAA 6 (L) 04/27/2019 0758   GFRAA 8 (L) 04/27/2019 0758   Lipase     Component Value Date/Time   LIPASE 24 04/20/2019 0229       Studies/Results: No results found.  Anti-infectives: Anti-infectives (From admission, onward)   Start     Dose/Rate Route Frequency Ordered Stop   04/26/19 1100  vancomycin (VANCOCIN) IVPB 1000 mg/200 mL premix     1,000 mg 200 mL/hr over 60 Minutes Intravenous  Once 04/26/19 1058 04/26/19 1321   04/23/19 1700  vancomycin (VANCOCIN) IVPB 1000 mg/200 mL premix     1,000 mg 200 mL/hr over 60 Minutes Intravenous  Once 04/23/19 1437 04/23/19 1925   04/21/19 2200  piperacillin-tazobactam (ZOSYN) IVPB 2.25 g  Status:  Discontinued     2.25 g 100 mL/hr over 30 Minutes Intravenous Every 12 hours 04/21/19 1127 04/22/19 1137   04/20/19 1730  vancomycin (VANCOCIN) 2,500 mg in sodium chloride 0.9 % 500 mL IVPB     2,500 mg 250 mL/hr over 120 Minutes Intravenous  Once 04/20/19 1633 04/20/19 1927   04/20/19 1645  vancomycin variable dose per unstable renal function (pharmacist dosing)      Does not apply See admin instructions 04/20/19 1645     04/20/19 1000  piperacillin-tazobactam (ZOSYN) IVPB 3.375 g  Status:  Discontinued     3.375 g 12.5 mL/hr over 240  Minutes Intravenous Every 12 hours 04/20/19 0911 04/21/19 1127   04/20/19 0500  ceFEPIme (MAXIPIME) 2 g in sodium chloride 0.9 % 100 mL IVPB     2 g 200 mL/hr over 30 Minutes Intravenous  Once 04/20/19 0447 04/20/19 0609       Assessment/Plan CHF CAD IDDM Morbid obesity  ESRD previously on PD, now on HD Abdominal Wall Cellulitis  Infected PD Catheter  S/p Removal of infected peritoneal dialysis catheter 5/7 Dr. Brantley Stage - POD 1 - Doing well postop day #1. Wounds clean. Change dressings once daily, pack each of 4 wounds loosely with iodoform packing strips. Ok to shower with wounds open. Home health nurse order placed for assistance with wound care. F/u scheduled in our office in about 2 weeks.  ID - Vancomycin 5/1 >>  FEN -  CM/renal diet VTE - SCDs, sq heparin Foley - none Follow up - [facility] clinic   LOS: 7 days    Wellington Hampshire , [facility] 04/27/2019, 8:37 AM Pager: [phone] Mon-Thurs 7:00 am-4:30 pm Fri 7:00 am -11:30 AM Sat-Sun 7:00 am-11:30 am

## 2019-04-27 NOTE — Discharge Instructions (Signed)
ABDOMINAL WOUND CARE AFTER SURGERY: - dressings to be changed once daily - supplies: sterile saline, iodoform packing strips, scissors, gauze, tape  - remove dressing and all packing carefully, moistening with sterile saline as needed to avoid packing/internal dressing sticking to the wound. - clean edges of skin around the wound with water/gauze, making sure there is no tape debris or leakage left on skin that could cause skin irritation or breakdown. - dampen iodoform packing strip with sterile saline and pack wound from wound base to skin level. Wound can be packed loosely. - cover wound with dry gauze and secure with tape.  - change dressing as needed if leakage occurs, wound gets contaminated, or patient requests to shower. - patient may shower daily with wound open and following the shower the wound should be dried and a clean dressing placed.

## 2019-04-27 NOTE — Anesthesia Postprocedure Evaluation (Signed)
Anesthesia Post Note  Patient: [name]  Procedure(s) Performed: REMOVAL OF INFECTED PERITONEAL DIALYSIS CATHETER (N/A Abdomen)     Patient location during evaluation: PACU Anesthesia Type: General Level of consciousness: awake and alert Pain management: pain level controlled Vital Signs Assessment: post-procedure vital signs reviewed and stable Respiratory status: spontaneous breathing, nonlabored ventilation, respiratory function stable and patient connected to nasal cannula oxygen Cardiovascular status: blood pressure returned to baseline and stable Postop Assessment: no apparent nausea or vomiting Anesthetic complications: no    Last Vitals:  Vitals:   04/26/19 2118 04/27/19 0646  BP: (!) 96/53 (!) 91/55  Pulse: 70 66  Resp: 20   Temp: 36.6 C   SpO2: 94% 100%    Last Pain:  Vitals:   04/26/19 2118  TempSrc: Oral  PainSc: 0-No pain   Pain Goal: Patients Stated Pain Goal: 0 (04/25/19 0907)                 Verdon Ferrante S

## 2019-04-27 NOTE — Progress Notes (Signed)
[facility]ine/[facility] has received referral for patient and will follow up with OP HD schedule from [facility] in [place]. Renal Navigator to follow closely.   Alphonzo Cruise Renal Navigator [phone]

## 2019-04-27 NOTE — Progress Notes (Signed)
Per [facility] Coordinator/[facility], referral still not received, though Renal Navigator confirmed that fax confirmation has been received on this end both times. Reginold Agent provided a new fax number to try. Referral for admission for modality change from home PD to in-center HD in [place] resent for third time to [facility].  Alphonzo Cruise Renal Navigator  [phone]

## 2019-04-27 NOTE — Progress Notes (Signed)
Renal Navigator notes that patient does not have CXR or PPD in chart and asked Dr. Finnegan/Nephrologist to order. It has been confirmed with [facility] that Quantiferon is sufficient and Dr. Grayland Ormond will order this. Renal Navigator called [facility]missions Coordinator to follow up on referral. She states she did not receive referral and asked that it be sent again. Referral re-faxed. Renal Navigator will follow closely.   Alphonzo Cruise Renal Navigator [phone]

## 2019-04-27 NOTE — Progress Notes (Signed)
Pharmacy Antibiotic Note  [name] is a 53 y.o. female admitted on 04/20/2019 with abdominal pain and noted with a history of ESRD with peritoneal dialysis.   Pharmacy has been consulted for vancomycin dosing for Staph epi peritonitis. She has been transitioned to HD and received a HD session this morning.  Plan: -Vanc 1 g x 1 -F/U further HD schedule, give Vanc 1g after each HD session  Height: _0  (165.1 cm) Weight: (!) 312 lb 6.3 oz (141.7 kg) IBW/kg (Calculated) : 57  Temp (24hrs), Avg:98 F (36.7 C), Min:97.9 F (36.6 C), Max:98.1 F (36.7 C)  Recent Labs  Lab 04/23/19 0522 04/24/19 0401 04/25/19 0352 04/26/19 0234 04/27/19 0223 04/27/19 0758  WBC 8.8 7.6 8.2 9.2  --  9.3  CREATININE 11.14* 6.20* 7.84* 5.16* 6.44* 6.71*  VANCORANDOM  --   --  26  --   --   --     Legrand Como, Pharm.D., BCPS, BCIDP Clinical Pharmacist Phone: [phone] Please check AMION for all [place] numbers 04/27/2019, 2:32 PM

## 2019-04-27 NOTE — Progress Notes (Signed)
[facility]    NEPHROLOGY PROGRESS NOTE  SUBJECTIVE: Patient complains of ongoing diffuse abdominal pain today.  Denies chest pain, shortness of breath, nausea, vomiting, diarrhea or dysuria.  Seen on hemodialysis this morning.  All other review of systems are negative.  OBJECTIVE:  Vitals:   04/27/19 1100 04/27/19 1145  BP: (!) 92/59 (!) 101/55  Pulse: 69 70  Resp:  (!) 22  Temp:  98 F (36.7 C)  SpO2:  100%    Intake/Output Summary (Last 24 hours) at 04/27/2019 1314 Last data filed at 04/27/2019 1145 Gross per 24 hour  Intake -  Output 2000 ml  Net -2000 ml      General:  AAOx3 NAD HEENT: MMM [place] AT anicteric sclera Neck:  No JVD, no adenopathy CV:  Heart RRR  Lungs:  L/S CTA bilaterally Abd:  abd soft, tender diffusely to palpation. GU:  Bladder non-palpable Extremities: Chronic lymphedema changes noted.. Skin:  No skin rash  MEDICATIONS:  . calcitRIOL  0.5 mcg Oral Daily  . Chlorhexidine Gluconate Cloth  6 each Topical Q0600  . docusate sodium  100 mg Oral BID  . gentamicin cream  1 application Topical Daily  . heparin  5,000 Units Subcutaneous Q8H  . insulin aspart  0-5 Units Subcutaneous QHS  . insulin aspart  0-9 Units Subcutaneous TID WC  . insulin detemir  3 Units Subcutaneous QHS  . lactulose  30 g Oral BID  . midodrine      . midodrine  10 mg Oral BID WC  . multivitamin  1 tablet Oral QHS  . vancomycin variable dose per unstable renal function (pharmacist dosing)   Does not apply See admin instructions       LABS:   CBC Latest Ref Rng & Units 04/27/2019 04/26/2019 04/25/2019  WBC 4.0 - 10.5 K/uL 9.3 9.2 8.2  Hemoglobin 12.0 - 15.0 g/dL 9.6(L) 9.9(L) 9.2(L)  Hematocrit 36.0 - 46.0 % 30.4(L) 32.4(L) 29.3(L)  Platelets 150 - 400 K/uL 242 244 247    CMP Latest Ref Rng & Units 04/27/2019 04/27/2019 04/26/2019  Glucose 70 - 99 mg/dL 99 124(H) 137(H)  BUN 6 - 20 mg/dL _0 Creatinine 0.44 - 1.00 mg/dL 6.71(H) 6.44(H) 5.16(H)  Sodium 135 -  145 mmol/L 135 137 138  Potassium 3.5 - 5.1 mmol/L 4.1 4.6 4.2  Chloride 98 - 111 mmol/L 101 102 102  CO2 22 - 32 mmol/L _1 Calcium 8.9 - 10.3 mg/dL 8.7(L) 8.6(L) 8.4(L)  Total Protein 6.5 - 8.1 g/dL - - -  Total Bilirubin 0.3 - 1.2 mg/dL - - -  Alkaline Phos 38 - 126 U/L - - -  AST 15 - 41 U/L - - -  ALT 0 - 44 U/L - - -    Lab Results  Component Value Date   PTH 376 (H) 12/31/2018   CALCIUM 8.7 (L) 04/27/2019   CAION 1.06 (L) 07/26/2017   PHOS 4.8 (H) 04/27/2019       Component Value Date/Time   COLORURINE YELLOW 08/11/2016 1800   APPEARANCEUR HAZY (A) 08/11/2016 1800   LABSPEC 1.020 08/11/2016 1800   PHURINE 5.5 08/11/2016 1800   GLUCOSEU NEGATIVE 08/11/2016 1800   HGBUR MODERATE (A) 08/11/2016 1800   BILIRUBINUR NEGATIVE 08/11/2016 1800   KETONESUR NEGATIVE 08/11/2016 1800   PROTEINUR 100 (A) 08/11/2016 1800   UROBILINOGEN 0.2 12/02/2014 1925   NITRITE NEGATIVE 08/11/2016 1800   LEUKOCYTESUR NEGATIVE 08/11/2016 1800  Component Value Date/Time   PHART 7.290 (L) 01/24/2017 1140   PCO2ART 41.0 01/24/2017 1140   PO2ART 85 01/24/2017 1140   HCO3 29.2 (H) 08/04/2017 0751   TCO2 31 08/04/2017 0751   ACIDBASEDEF 6.3 (H) 01/24/2017 1140   O2SAT 71.0 08/04/2017 0751       Component Value Date/Time   IRON 35 12/27/2018 0710   TIBC 174 (L) 12/27/2018 0710   FERRITIN 1,085 (H) 12/27/2018 0710   IRONPCTSAT 20 12/27/2018 0710       ASSESSMENT/PLAN:    53 year old female patient with a past medical history significant for congestive heart failure, diabetes, morbid obesity, and end-stage renal disease on peritoneal dialysis with Dr. Lowanda Foster who was admitted with abdominal pain and staph epi peritonitis.  She was not tolerant of peritoneal dialysis, and she was status post removal of her PD catheter on 04/26/2019.  She has been transitioned to hemodialysis.  1.  Staph epi peritoneal dialysis related peritonitis.  Plan for 3 weeks of total antibiotics.  Continue IV  vancomycin.  2.  End-stage renal disease on peritoneal dialysis.  She is now transition to hemodialysis.  She is hoping to continue a Monday, Wednesday, and Friday dialysis schedule.  Awaiting outpatient dialysis approval.  3.  Secondary hyperparathyroidism.  Continue calcitriol.  Is off binders for a controlled phosphorus.  4.  Hypotension.  On midodrine.     [place], DO, [place]

## 2019-04-27 NOTE — Plan of Care (Signed)
  Problem: Clinical Measurements: Goal: Will remain free from infection Outcome: Progressing   Problem: Clinical Measurements: Goal: Respiratory complications will improve Outcome: Progressing

## 2019-04-28 LAB — RENAL FUNCTION PANEL
Albumin: 2.1 g/dL — ABNORMAL LOW (ref 3.5–5.0)
Anion gap: 13 (ref 5–15)
BUN: 13 mg/dL (ref 6–20)
CO2: 26 mmol/L (ref 22–32)
Calcium: 8.9 mg/dL (ref 8.9–10.3)
Chloride: 97 mmol/L — ABNORMAL LOW (ref 98–111)
Creatinine, Ser: 5.17 mg/dL — ABNORMAL HIGH (ref 0.44–1.00)
GFR calc Af Amer: 10 mL/min — ABNORMAL LOW (ref >60)
GFR calc non Af Amer: 9 mL/min — ABNORMAL LOW (ref >60)
Glucose, Bld: 102 mg/dL — ABNORMAL HIGH (ref 70–99)
Phosphorus: 4.2 mg/dL (ref 2.5–4.6)
Potassium: 4.9 mmol/L (ref 3.5–5.1)
Sodium: 136 mmol/L (ref 135–145)

## 2019-04-28 LAB — HEPATITIS B CORE ANTIBODY, IGM: Hep B C IgM: NEGATIVE

## 2019-04-28 LAB — GLUCOSE, CAPILLARY
Glucose-Capillary: 97 mg/dL (ref 70–99)
Glucose-Capillary: 116 mg/dL — ABNORMAL HIGH (ref 70–99)
Glucose-Capillary: 131 mg/dL — ABNORMAL HIGH (ref 70–99)
Glucose-Capillary: 131 mg/dL — ABNORMAL HIGH (ref 70–99)

## 2019-04-28 LAB — HEPATITIS B CORE ANTIBODY, TOTAL: Hep B Core Total Ab: NEGATIVE

## 2019-04-28 LAB — HEPATITIS B SURFACE ANTIBODY, QUANTITATIVE: Hep B S AB Quant (Post): 18.3 m[IU]/mL (ref >9.9)

## 2019-04-28 MED ORDER — LANTHANUM CARBONATE 500 MG PO CHEW
1000.0000 mg | CHEWABLE_TABLET | Freq: Three times a day (TID) | ORAL | Status: DC
  Administered 2019-04-28 – 2019-04-30 (×6): 1000 mg via ORAL
  Filled 2019-04-28 (×6): qty 2

## 2019-04-28 NOTE — Progress Notes (Signed)
[facility]    NEPHROLOGY PROGRESS NOTE  SUBJECTIVE: Patient reports overall feeling better this morning.  Abdominal pain is subsiding.  Denies chest pain, shortness of breath, nausea, vomiting, diarrhea or dysuria.  All other review of systems are negative.  OBJECTIVE:  Vitals:   04/27/19 2132 04/28/19 0621  BP: (!) 95/46 (!) 98/44  Pulse: 63 62  Resp:  20  Temp: 98.1 F (36.7 C) 98.1 F (36.7 C)  SpO2: (!) 89% 97%    Intake/Output Summary (Last 24 hours) at 04/28/2019 1136 Last data filed at 04/28/2019 0900 Gross per 24 hour  Intake 561.62 ml  Output 2000 ml  Net -1438.38 ml      General:  AAOx3 NAD HEENT: MMM [place] AT anicteric sclera Neck:  No JVD, no adenopathy CV:  Heart RRR  Lungs:  L/S CTA bilaterally Abd:  abd soft, tender diffusely to palpation. GU:  Bladder non-palpable Extremities: Chronic lymphedema changes noted.. Skin:  No skin rash  MEDICATIONS:  . calcitRIOL  0.5 mcg Oral Daily  . Chlorhexidine Gluconate Cloth  6 each Topical Q0600  . docusate sodium  100 mg Oral BID  . gentamicin cream  1 application Topical Daily  . heparin  5,000 Units Subcutaneous Q8H  . insulin aspart  0-5 Units Subcutaneous QHS  . insulin aspart  0-9 Units Subcutaneous TID WC  . insulin detemir  3 Units Subcutaneous QHS  . lactulose  30 g Oral BID  . midodrine  10 mg Oral BID WC  . multivitamin  1 tablet Oral QHS  . vancomycin variable dose per unstable renal function (pharmacist dosing)   Does not apply See admin instructions       LABS:   CBC Latest Ref Rng & Units 04/27/2019 04/26/2019 04/25/2019  WBC 4.0 - 10.5 K/uL 9.3 9.2 8.2  Hemoglobin 12.0 - 15.0 g/dL 9.6(L) 9.9(L) 9.2(L)  Hematocrit 36.0 - 46.0 % 30.4(L) 32.4(L) 29.3(L)  Platelets 150 - 400 K/uL 242 244 247    CMP Latest Ref Rng & Units 04/28/2019 04/27/2019 04/27/2019  Glucose 70 - 99 mg/dL 102(H) 99 124(H)  BUN 6 - 20 mg/dL _0 Creatinine 0.44 - 1.00 mg/dL 5.17(H) 6.71(H) 6.44(H)  Sodium 135 -  145 mmol/L 136 135 137  Potassium 3.5 - 5.1 mmol/L 4.9 4.1 4.6  Chloride 98 - 111 mmol/L 97(L) 101 102  CO2 22 - 32 mmol/L _1 Calcium 8.9 - 10.3 mg/dL 8.9 8.7(L) 8.6(L)  Total Protein 6.5 - 8.1 g/dL - - -  Total Bilirubin 0.3 - 1.2 mg/dL - - -  Alkaline Phos 38 - 126 U/L - - -  AST 15 - 41 U/L - - -  ALT 0 - 44 U/L - - -    Lab Results  Component Value Date   PTH 376 (H) 12/31/2018   CALCIUM 8.9 04/28/2019   CAION 1.06 (L) 07/26/2017   PHOS 4.2 04/28/2019       Component Value Date/Time   COLORURINE YELLOW 08/11/2016 1800   APPEARANCEUR HAZY (A) 08/11/2016 1800   LABSPEC 1.020 08/11/2016 1800   PHURINE 5.5 08/11/2016 1800   GLUCOSEU NEGATIVE 08/11/2016 1800   HGBUR MODERATE (A) 08/11/2016 1800   BILIRUBINUR NEGATIVE 08/11/2016 1800   KETONESUR NEGATIVE 08/11/2016 1800   PROTEINUR 100 (A) 08/11/2016 1800   UROBILINOGEN 0.2 12/02/2014 1925   NITRITE NEGATIVE 08/11/2016 1800   LEUKOCYTESUR NEGATIVE 08/11/2016 1800      Component Value Date/Time   PHART 7.290 (  L) 01/24/2017 1140   PCO2ART 41.0 01/24/2017 1140   PO2ART 85 01/24/2017 1140   HCO3 29.2 (H) 08/04/2017 0751   TCO2 31 08/04/2017 0751   ACIDBASEDEF 6.3 (H) 01/24/2017 1140   O2SAT 71.0 08/04/2017 0751       Component Value Date/Time   IRON 35 12/27/2018 0710   TIBC 174 (L) 12/27/2018 0710   FERRITIN 1,085 (H) 12/27/2018 0710   IRONPCTSAT 20 12/27/2018 0710       ASSESSMENT/PLAN:    53 year old female patient with a past medical history significant for congestive heart failure, diabetes, morbid obesity, and end-stage renal disease on peritoneal dialysis with Dr. Lowanda Foster who was admitted with abdominal pain and staph epi peritonitis.  She was not tolerant of peritoneal dialysis, and she was status post removal of her PD catheter on 04/26/2019.  She has been transitioned to hemodialysis.  1.  Staph epi peritoneal dialysis related peritonitis.  Plan for 3 weeks of total antibiotics.  Continue IV  vancomycin.  Abdominal pain is improving.  2.  End-stage renal disease on peritoneal dialysis.  She is now transition to hemodialysis.  She is hoping to continue a Monday, Wednesday, and Friday dialysis schedule.  Awaiting outpatient dialysis approval.  3.  Secondary hyperparathyroidism.  Continue calcitriol.  Is off binders for a controlled phosphorus.  4.  Hypotension.  On midodrine.     [place], DO, [place]

## 2019-04-28 NOTE — Progress Notes (Signed)
[facility]                                                                              Patient Demographics  [name], is a 53 y.o. female, DOB - 09-29-1966, NOM:[id number]  Admit date - 04/20/2019   Admitting Physician Marcell Anger, MD  Outpatient Primary MD for the patient is [name], MD  Outpatient specialists:   LOS - 8  days   Medical records reviewed and are as summarized below:    Chief Complaint  Patient presents with   Abdominal Pain       Brief summary    Patient is a 53 year old female with anxiety, chronic diastolic heart failure, history of fibromyalgia, type 2 diabetes mellitus chronically on insulin with nephropathy, chronic respiratory failure, morbid obesity with hypoventilation syndromeandend-stage renal disease on peritoneal dialysis;who presented to the emergency department secondary to acute development of severe abdominal pain (left lower quadrant), 9/10 with associated nausea and vomiting. Patient reports partially being able to initiate her nocturnal peritoneal dialysis treatment.Patient also expressed some ongoing constipation and decrease in her appetite. In the ED patient was found with a low blood pressure, elevated WBCsandCT scan with some improvement in her recent fluid collection and peritonitis changes;no fever. IV fluids were given, cultures taken, IV antibiotics started. Patient was admitted for further work-up.  Assessment & Plan    Principal Problem: Abdominal pain with peritonitis in the setting of peritoneal dialysis/ESRD -PD cultures shows staph epidermidis -Given persistence of abdominal pain, CT scan was obtained which showed persistent 2.2 cm collection at the entry site of catheter and surrounding tenderness -General surgery consulted, infected PD catheter removed on 5/7 -Transitioned to HD, awaiting outpatient HD center -Continue IV vancomycin , will need 3 weeks of  antibiotics  ESRD on PD -PD catheter removed, patient will need to undergo hemodialysis center. Will need outpatient dialysis center arranged prior to discharge. Nephrology following.  Constipation -Improved with lactulose   Diabetes mellitus type 2 with renal complication -Hemoglobin A1c 7.1, -CBGs stable, continue Lantus, sliding scale insulin  Morbid obesity BMI 47, recommended continue exercise, diet control  Essential hypertension -On presentation BP low likely due to peritonitis now improving -BP still soft, continue to hold antihypertensives  Hyperlipidemia -Not on statins currently  History of chronic respiratory failure, OHS -Stable, O2 sats 100% on 2 L -Chronically at home on 2 L O2   Chronic diastolic CHF -Currently stable, continue HD for volume management  Anxiety -Currently stable, no acute issues  Code Status: Full code DVT Prophylaxis: Heparin subcu Family Communication: Discussed in detail with the patient, all imaging results, lab results explained to the patient.  Discussed with patient's husband on the phone yesterday   Disposition Plan: PD catheter removed, needs outpatient HD center/clip  Time Spent in minutes 25 minutes  Procedures:  Hemodialysis Removal of PD catheter on 04/26/2019  Consultants:   Nephrology General surgery  Antimicrobials:   Anti-infectives (From admission, onward)   Start     Dose/Rate Route Frequency Ordered Stop   04/27/19 1600  vancomycin (VANCOCIN) IVPB 1000 mg/200 mL premix     1,000 mg 200  mL/hr over 60 Minutes Intravenous  Once 04/27/19 1430 04/27/19 2015   04/26/19 1100  vancomycin (VANCOCIN) IVPB 1000 mg/200 mL premix     1,000 mg 200 mL/hr over 60 Minutes Intravenous  Once 04/26/19 1058 04/26/19 1321   04/23/19 1700  vancomycin (VANCOCIN) IVPB 1000 mg/200 mL premix     1,000 mg 200 mL/hr over 60 Minutes Intravenous  Once 04/23/19 1437 04/23/19 1925   04/21/19 2200  piperacillin-tazobactam (ZOSYN)  IVPB 2.25 g  Status:  Discontinued     2.25 g 100 mL/hr over 30 Minutes Intravenous Every 12 hours 04/21/19 1127 04/22/19 1137   04/20/19 1730  vancomycin (VANCOCIN) 2,500 mg in sodium chloride 0.9 % 500 mL IVPB     2,500 mg 250 mL/hr over 120 Minutes Intravenous  Once 04/20/19 1633 04/20/19 1927   04/20/19 1645  vancomycin variable dose per unstable renal function (pharmacist dosing)      Does not apply See admin instructions 04/20/19 1645     04/20/19 1000  piperacillin-tazobactam (ZOSYN) IVPB 3.375 g  Status:  Discontinued     3.375 g 12.5 mL/hr over 240 Minutes Intravenous Every 12 hours 04/20/19 0911 04/21/19 1127   04/20/19 0500  ceFEPIme (MAXIPIME) 2 g in sodium chloride 0.9 % 100 mL IVPB     2 g 200 mL/hr over 30 Minutes Intravenous  Once 04/20/19 0447 04/20/19 0609         Medications  Scheduled Meds:  calcitRIOL  0.5 mcg Oral Daily   Chlorhexidine Gluconate Cloth  6 each Topical Q0600   docusate sodium  100 mg Oral BID   gentamicin cream  1 application Topical Daily   heparin  5,000 Units Subcutaneous Q8H   insulin aspart  0-5 Units Subcutaneous QHS   insulin aspart  0-9 Units Subcutaneous TID WC   insulin detemir  3 Units Subcutaneous QHS   lactulose  30 g Oral BID   lanthanum  1,000 mg Oral TID WC   midodrine  10 mg Oral BID WC   multivitamin  1 tablet Oral QHS   vancomycin variable dose per unstable renal function (pharmacist dosing)   Does not apply See admin instructions   Continuous Infusions:  PRN Meds:.acetaminophen, albuterol, ALPRAZolam, HYDROmorphone (DILAUDID) injection, ondansetron (ZOFRAN) IV, oxyCODONE-acetaminophen      Subjective:   [name] was seen and examined today.  Doing well, no acute issues, awaiting outpatient HD center prior to discharge.  Abdominal pain improving, still feels tender in the PD catheter area.   No fevers or chills.  No acute issues overnight. Patient denies dizziness, chest pain, shortness of  breath.   Objective:   Vitals:   04/27/19 1145 04/27/19 1710 04/27/19 2132 04/28/19 0621  BP: (!) 101/55 (!) 105/59 (!) 95/46 (!) 98/44  Pulse: 70 (!) 50 63 62  Resp: (!) _0 Temp: 98 F (36.7 C) 98 F (36.7 C) 98.1 F (36.7 C) 98.1 F (36.7 C)  TempSrc: Oral Oral Oral Oral  SpO2: 100% 100% (!) 89% 97%  Weight: (!) 141.7 kg     Height:        Intake/Output Summary (Last 24 hours) at 04/28/2019 1532 Last data filed at 04/28/2019 0900 Gross per 24 hour  Intake 561.62 ml  Output --  Net 561.62 ml     Wt Readings from Last 3 Encounters:  04/27/19 (!) 141.7 kg  01/04/19 (!) 158.3 kg  07/13/18 (!) 166.9 kg   Physical Exam  General: Alert and oriented  x 3, NAD  Eyes:   HEENT:  Atraumatic, normocephalic  Cardiovascular: S1 S2 clear, RRR. No pedal edema b/l  Respiratory: CTAB, no wheezing, rales or rhonchi  Gastrointestinal: Soft, dressing intact, mild abdominal TTP in the lower abdomen  Ext: no pedal edema bilaterally  Neuro: no new deficits  Musculoskeletal: No cyanosis, clubbing  Skin: No rashes  Psych: Normal affect and demeanor, alert and oriented x3      Data Reviewed:  I have personally reviewed following labs and imaging studies  Micro Results Recent Results (from the past 240 hour(s))  Gram stain     Status: None   Collection Time: 04/20/19  2:30 AM  Result Value Ref Range Status   Specimen Description PERITONEAL DIALYSATE  Final   Special Requests Normal  Final   Gram Stain   Final    CYTOSPIN SMEAR WBC PRESENT, PREDOMINANTLY PMN NO ORGANISMS SEEN RESULT CALLED TO, READ BACK BY AND VERIFIED WITH: J SAPPELT,RN _0  04/20/19 [facility] Performed at [facility], [address] [place], [place] [address]    Report Status 04/20/2019 FINAL  Final  Culture, body fluid-bottle     Status: Abnormal   Collection Time: 04/20/19  2:30 AM  Result Value Ref Range Status   Specimen Description   Final    PERITONEAL DIALYSATE Performed at [facility], [address] [place], [place] [address]    Special Requests   Final    PERITONEAL DIALYSATE Performed at [facility], [address] [place], [place] [address]    Gram Stain   Final    GRAM POSITIVE COCCI ANAEROBIC BOTTLE Gram Stain Report Called to,Read Back By and Verified With: M. MUELLER _1   04/20/19 [facility] Performed at [facility], [address], [place], [place] [address]    Culture STAPHYLOCOCCUS EPIDERMIDIS (A)  Final   Report Status 04/22/2019 FINAL  Final   Organism ID, Bacteria STAPHYLOCOCCUS EPIDERMIDIS  Final      Susceptibility   Staphylococcus epidermidis - MIC*    CIPROFLOXACIN <=0.5 SENSITIVE Sensitive     ERYTHROMYCIN >=8 RESISTANT Resistant     GENTAMICIN <=0.5 SENSITIVE Sensitive     OXACILLIN >=4 RESISTANT Resistant     TETRACYCLINE 2 SENSITIVE Sensitive     VANCOMYCIN 2 SENSITIVE Sensitive     TRIMETH/SULFA <=10 SENSITIVE Sensitive     CLINDAMYCIN <=0.25 SENSITIVE Sensitive     RIFAMPIN <=0.5 SENSITIVE Sensitive     Inducible Clindamycin NEGATIVE Sensitive     * STAPHYLOCOCCUS EPIDERMIDIS  Stat Gram stain     Status: None   Collection Time: 04/21/19  1:31 AM  Result Value Ref Range Status   Specimen Description PERITONEAL FLUID  Final   Special Requests NONE  Final   Gram Stain   Final    WBC PRESENT, PREDOMINANTLY PMN NO ORGANISMS SEEN CYTOSPIN SMEAR Performed at [facility], [address], [place], [place] [address]    Report Status 04/21/2019 FINAL  Final  Culture, body fluid-bottle     Status: None   Collection Time: 04/21/19  1:33 AM  Result Value Ref Range Status   Specimen Description PERITONEAL FLUID  Final   Special Requests   Final    BOTTLES DRAWN AEROBIC AND ANAEROBIC Blood Culture adequate volume   Culture   Final    NO GROWTH 5 DAYS Performed at [facility], [place] [address] [place], [place] [address]    Report Status 04/26/2019 FINAL  Final  MRSA PCR Screening  Status: None   Collection Time: 04/24/19  11:38 AM  Result Value Ref Range Status   MRSA by PCR NEGATIVE NEGATIVE Final    Comment:        The GeneXpert MRSA Assay (FDA approved for NASAL specimens only), is one component of a comprehensive MRSA colonization surveillance program. It is not intended to diagnose MRSA infection nor to guide or monitor treatment for MRSA infections. Performed at [facility], [place] [address] [place], [place] [address]   Surgical pcr screen     Status: None   Collection Time: 04/26/19  5:07 AM  Result Value Ref Range Status   MRSA, PCR NEGATIVE NEGATIVE Final   Staphylococcus aureus NEGATIVE NEGATIVE Final    Comment: (NOTE) The Xpert SA Assay (FDA approved for NASAL specimens in patients 31 years of age and older), is one component of a comprehensive surveillance program. It is not intended to diagnose infection nor to guide or monitor treatment. Performed at [facility], [place] [address], [place], [place] [address]     Radiology Reports Ct Abdomen Pelvis Wo Contrast  Result Date: 04/20/2019 CLINICAL DATA:  Initial evaluation for acute abdominal pain, shortness of breath while undergoing peritoneal dialysis. EXAM: CT ABDOMEN AND PELVIS WITHOUT CONTRAST TECHNIQUE: Multidetector CT imaging of the abdomen and pelvis was performed following the standard protocol without IV contrast. COMPARISON:  Prior CT from 12/27/2018. FINDINGS: Lower chest: Atelectatic changes seen dependently within the visualized lung bases. Visualized lungs are otherwise clear. Hepatobiliary: Liver demonstrates a normal unenhanced appearance. Gallbladder absent. No biliary dilatation. Pancreas: Pancreas within normal limits. Spleen: Spleen within normal limits. Adrenals/Urinary Tract: Adrenal glands are normal. Kidneys equal in size without nephrolithiasis or hydronephrosis. No hydroureter. Bladder decompressed without abnormality. Stomach/Bowel: Stomach within normal limits. No evidence for bowel obstruction. No  evidence for acute appendicitis. Mild colonic diverticulosis without evidence for acute diverticulitis. No acute inflammatory changes seen about the bowels. Vascular/Lymphatic: Intra-abdominal aorta of normal caliber. Mild aorto bi-iliac atherosclerotic disease. No adenopathy. Reproductive: Prior hysterectomy.  No adnexal mass. Other: Peritoneal dialysis catheter in place extending via the left lower quadrant, tip position within the left lower quadrant stable from previous. Persistent 2.2 cm collection at the entry site of the catheter, slightly decreased from previous (series 2, image 68). Persistent soft tissue stranding within the partially visualized adjacent abdominal wall extending into the pannus, suggesting persistent cellulitis/panniculitis, improved in appearance relative to previous exam. No soft tissue emphysema. Scattered free air with trace free fluid within the abdomen compatible with dialysis. Musculoskeletal: No acute osseous abnormality. No discrete lytic or blastic osseous lesions. IMPRESSION: 1. Persistent soft tissue stranding within the left anterior abdominal wall and pannus, suggesting possible cellulitis/panniculitis, improved in appearance relative to most recent CT from 12/27/2018. Persistent 2.2 cm collection at the peritoneal dialysis catheter in treat point, slightly decreased from previous as well. 2. No other acute intra-abdominal or pelvic process. 3. Free intraperitoneal air, compatible with peritoneal dialysis. 4. Mild colonic diverticulosis without evidence for acute diverticulitis. Electronically Signed   By: Jeannine Boga M.D.   On: 04/20/2019 04:32   Dg Abd 2 Views  Result Date: 04/23/2019 CLINICAL DATA:  Abdominal pain and vomiting. EXAM: ABDOMEN - 2 VIEW COMPARISON:  CT abdomen and pelvis 04/20/2019. Plain films of the abdomen 12/30/2018. FINDINGS: No free intraperitoneal air is identified. The bowel gas pattern is nonobstructive. Radiopaque densities projecting in  the right abdomen could be retained contrast in diverticula or medicine. IMPRESSION: No acute finding.  Electronically Signed   By: Inge Rise M.D.   On: 04/23/2019 12:09    Lab Data:  CBC: Recent Labs  Lab 04/22/19 0413 04/23/19 0522 04/24/19 0401 04/25/19 0352 04/26/19 0234 04/27/19 0758  WBC 8.6 8.8 7.6 8.2 9.2 9.3  NEUTROABS 6.5 6.5 5.2 5.2 6.1  --   HGB 10.2* 10.4* 9.9* 9.2* 9.9* 9.6*  HCT 31.8* 31.9* 30.6* 29.3* 32.4* 30.4*  MCV 98.1 98.5 98.1 98.7 100.6* 101.7*  PLT 286 275 253 247 244 833   Basic Metabolic Panel: Recent Labs  Lab 04/23/19 0522  04/25/19 0352 04/26/19 0234 04/27/19 0223 04/27/19 0758 04/28/19 0351  NA 138   < > 138 138 137 135 136  K 3.4*   < > 3.8 4.2 4.6 4.1 4.9  CL 104   < > 98 102 102 101 97*  CO2 19*   < > _0 GLUCOSE 79   < > 100* 137* 124* 99 102*  BUN 49*   < > 26* _1 CREATININE 11.14*   < > 7.84* 5.16* 6.44* 6.71* 5.17*  CALCIUM 8.1*   < > 8.8* 8.4* 8.6* 8.7* 8.9  MG 2.1  --   --   --   --   --   --   PHOS  --    < > 4.4 2.6 4.4 4.8* 4.2   < > = values in this interval not displayed.   GFR: Estimated Creatinine Clearance: 18.3 mL/min (A) (by C-G formula based on SCr of 5.17 mg/dL (H)). Liver Function Tests: Recent Labs  Lab 04/25/19 0352 04/26/19 0234 04/27/19 0223 04/27/19 0758 04/28/19 0351  ALBUMIN 1.8* 2.0* 2.0* 2.0* 2.1*   No results for input(s): LIPASE, AMYLASE in the last 168 hours. No results for input(s): AMMONIA in the last 168 hours. Coagulation Profile: No results for input(s): INR, PROTIME in the last 168 hours. Cardiac Enzymes: No results for input(s): CKTOTAL, CKMB, CKMBINDEX, TROPONINI in the last 168 hours. BNP (last 3 results) No results for input(s): PROBNP in the last 8760 hours. HbA1C: No results for input(s): HGBA1C in the last 72 hours. CBG: Recent Labs  Lab 04/27/19 1235 04/27/19 1701 04/27/19 2129 04/28/19 0754 04/28/19 1129  GLUCAP 112* 118* 128* 97 116*    Lipid Profile: No results for input(s): CHOL, HDL, LDLCALC, TRIG, CHOLHDL, LDLDIRECT in the last 72 hours. Thyroid Function Tests: No results for input(s): TSH, T4TOTAL, FREET4, T3FREE, THYROIDAB in the last 72 hours. Anemia Panel: No results for input(s): VITAMINB12, FOLATE, FERRITIN, TIBC, IRON, RETICCTPCT in the last 72 hours. Urine analysis:    Component Value Date/Time   COLORURINE YELLOW 08/11/2016 1800   APPEARANCEUR HAZY (A) 08/11/2016 1800   LABSPEC 1.020 08/11/2016 1800   PHURINE 5.5 08/11/2016 1800   GLUCOSEU NEGATIVE 08/11/2016 1800   HGBUR MODERATE (A) 08/11/2016 1800   BILIRUBINUR NEGATIVE 08/11/2016 1800   KETONESUR NEGATIVE 08/11/2016 1800   PROTEINUR 100 (A) 08/11/2016 1800   UROBILINOGEN 0.2 12/02/2014 1925   NITRITE NEGATIVE 08/11/2016 1800   LEUKOCYTESUR NEGATIVE 08/11/2016 1800     Larin Depaoli M.D. [facility] 04/28/2019, 3:32 PM  Pager: [phone] Between 7am to 7pm - call Pager - [phone]  After 7pm go to [email or web address] - password [email or web address]  Call night coverage person covering after 7pm

## 2019-04-29 LAB — QUANTIFERON-TB GOLD PLUS (RQFGPL)
QuantiFERON Mitogen Value: 1.18 IU/mL
QuantiFERON Nil Value: 0.02 IU/mL
QuantiFERON TB1 Ag Value: 0.02 IU/mL
QuantiFERON TB2 Ag Value: 0.02 IU/mL

## 2019-04-29 LAB — GLUCOSE, CAPILLARY
Glucose-Capillary: 129 mg/dL — ABNORMAL HIGH (ref 70–99)
Glucose-Capillary: 112 mg/dL — ABNORMAL HIGH (ref 70–99)
Glucose-Capillary: 127 mg/dL — ABNORMAL HIGH (ref 70–99)
Glucose-Capillary: 102 mg/dL — ABNORMAL HIGH (ref 70–99)

## 2019-04-29 LAB — QUANTIFERON-TB GOLD PLUS: QuantiFERON-TB Gold Plus: NEGATIVE

## 2019-04-29 NOTE — Progress Notes (Signed)
[facility]    NEPHROLOGY PROGRESS NOTE  SUBJECTIVE: Patient resting on examination today.  Denies chest pain, shortness of breath, nausea, vomiting, diarrhea or dysuria.  All other review of systems are negative.  OBJECTIVE:  Vitals:   04/28/19 2209 04/29/19 0526  BP: (!) 86/38 92/61  Pulse:  (!) 52  Resp:  20  Temp:  97.9 F (36.6 C)  SpO2:  99%    Intake/Output Summary (Last 24 hours) at 04/29/2019 1258 Last data filed at 04/29/2019 0900 Gross per 24 hour  Intake 960 ml  Output -  Net 960 ml      General:  AAOx3 NAD HEENT: MMM [place] AT anicteric sclera Neck:  No JVD, no adenopathy CV:  Heart RRR  Lungs:  L/S CTA bilaterally Abd:  abd soft, tender diffusely to palpation. GU:  Bladder non-palpable Extremities: Chronic lymphedema changes noted.. Skin:  No skin rash  MEDICATIONS:  . calcitRIOL  0.5 mcg Oral Daily  . Chlorhexidine Gluconate Cloth  6 each Topical Q0600  . docusate sodium  100 mg Oral BID  . gentamicin cream  1 application Topical Daily  . heparin  5,000 Units Subcutaneous Q8H  . insulin aspart  0-5 Units Subcutaneous QHS  . insulin aspart  0-9 Units Subcutaneous TID WC  . insulin detemir  3 Units Subcutaneous QHS  . lactulose  30 g Oral BID  . lanthanum  1,000 mg Oral TID WC  . midodrine  10 mg Oral BID WC  . multivitamin  1 tablet Oral QHS  . vancomycin variable dose per unstable renal function (pharmacist dosing)   Does not apply See admin instructions       LABS:   CBC Latest Ref Rng & Units 04/27/2019 04/26/2019 04/25/2019  WBC 4.0 - 10.5 K/uL 9.3 9.2 8.2  Hemoglobin 12.0 - 15.0 g/dL 9.6(L) 9.9(L) 9.2(L)  Hematocrit 36.0 - 46.0 % 30.4(L) 32.4(L) 29.3(L)  Platelets 150 - 400 K/uL 242 244 247    CMP Latest Ref Rng & Units 04/28/2019 04/27/2019 04/27/2019  Glucose 70 - 99 mg/dL 102(H) 99 124(H)  BUN 6 - 20 mg/dL _0 Creatinine 0.44 - 1.00 mg/dL 5.17(H) 6.71(H) 6.44(H)  Sodium 135 - 145 mmol/L 136 135 137  Potassium 3.5 - 5.1  mmol/L 4.9 4.1 4.6  Chloride 98 - 111 mmol/L 97(L) 101 102  CO2 22 - 32 mmol/L _1 Calcium 8.9 - 10.3 mg/dL 8.9 8.7(L) 8.6(L)  Total Protein 6.5 - 8.1 g/dL - - -  Total Bilirubin 0.3 - 1.2 mg/dL - - -  Alkaline Phos 38 - 126 U/L - - -  AST 15 - 41 U/L - - -  ALT 0 - 44 U/L - - -    Lab Results  Component Value Date   PTH 376 (H) 12/31/2018   CALCIUM 8.9 04/28/2019   CAION 1.06 (L) 07/26/2017   PHOS 4.2 04/28/2019       Component Value Date/Time   COLORURINE YELLOW 08/11/2016 1800   APPEARANCEUR HAZY (A) 08/11/2016 1800   LABSPEC 1.020 08/11/2016 1800   PHURINE 5.5 08/11/2016 1800   GLUCOSEU NEGATIVE 08/11/2016 1800   HGBUR MODERATE (A) 08/11/2016 1800   BILIRUBINUR NEGATIVE 08/11/2016 1800   KETONESUR NEGATIVE 08/11/2016 1800   PROTEINUR 100 (A) 08/11/2016 1800   UROBILINOGEN 0.2 12/02/2014 1925   NITRITE NEGATIVE 08/11/2016 1800   LEUKOCYTESUR NEGATIVE 08/11/2016 1800      Component Value Date/Time   PHART 7.290 (L) 01/24/2017 1140  PCO2ART 41.0 01/24/2017 1140   PO2ART 85 01/24/2017 1140   HCO3 29.2 (H) 08/04/2017 0751   TCO2 31 08/04/2017 0751   ACIDBASEDEF 6.3 (H) 01/24/2017 1140   O2SAT 71.0 08/04/2017 0751       Component Value Date/Time   IRON 35 12/27/2018 0710   TIBC 174 (L) 12/27/2018 0710   FERRITIN 1,085 (H) 12/27/2018 0710   IRONPCTSAT 20 12/27/2018 0710       ASSESSMENT/PLAN:    53 year old female patient with a past medical history significant for congestive heart failure, diabetes, morbid obesity, and end-stage renal disease on peritoneal dialysis with Dr. Lowanda Foster who was admitted with abdominal pain and staph epi peritonitis.  She was not tolerant of peritoneal dialysis, and she was status post removal of her PD catheter on 04/26/2019.  She has been transitioned to hemodialysis.  1.  Staph epi peritoneal dialysis related peritonitis.  Plan for 3 weeks of total antibiotics.  Continue IV vancomycin.  Abdominal pain is improving.  2.   End-stage renal disease on peritoneal dialysis.  She is now transition to hemodialysis.  She is hoping to continue a Monday, Wednesday, and Friday dialysis schedule.  Awaiting outpatient dialysis approval.  3.  Secondary hyperparathyroidism.  Continue calcitriol.  Is off binders for a controlled phosphorus.  4.  Hypotension.  On midodrine.     [place], DO, [place]

## 2019-04-29 NOTE — Progress Notes (Addendum)
Pharmacy Antibiotic Note  [name] is a 53 y.o. female admitted on 04/20/2019 with abdominal pain and noted with a history of ESRD with peritoneal dialysis.   Pharmacy has been consulted for vancomycin dosing (for 3 weeks total) for Staph epi peritonitis . She has been transitioned to HD with plans for HD MWF  Plan: -will check a pre-HD vancomycin level on 5/11 to determine further maintenance doses (has been on vancomycin 102m IV with HD)  Height: _0  (165.1 cm) Weight: (!) 312 lb 9.8 oz (141.8 kg) IBW/kg (Calculated) : 57  Temp (24hrs), Avg:97.8 F (36.6 C), Min:97.5 F (36.4 C), Max:98.1 F (36.7 C)  Recent Labs  Lab 04/23/19 0522 04/24/19 0401 04/25/19 0352 04/26/19 0234 04/27/19 0223 04/27/19 0758 04/28/19 0351  WBC 8.8 7.6 8.2 9.2  --  9.3  --   CREATININE 11.14* 6.20* 7.84* 5.16* 6.44* 6.71* 5.17*  VANCORANDOM  --   --  2107 --   --   --   --      AHildred Laser PharmD Clinical Pharmacist **Pharmacist phone directory can now be found on [email or web address] (PW [email or web address]).  Listed under [place]

## 2019-04-29 NOTE — Progress Notes (Signed)
[facility]                                                                              Patient Demographics  [name], is a 53 y.o. female, DOB - May 28, 1966, [id number]  Admit date - 04/20/2019   Admitting Physician Marcell Anger, MD  Outpatient Primary MD for the patient is [name], MD  Outpatient specialists:   LOS - 9  days   Medical records reviewed and are as summarized below:    Chief Complaint  Patient presents with   Abdominal Pain       Brief summary    Patient is a 53 year old female with anxiety, chronic diastolic heart failure, history of fibromyalgia, type 2 diabetes mellitus chronically on insulin with nephropathy, chronic respiratory failure, morbid obesity with hypoventilation syndromeandend-stage renal disease on peritoneal dialysis;who presented to the emergency department secondary to acute development of severe abdominal pain (left lower quadrant), 9/10 with associated nausea and vomiting. Patient reports partially being able to initiate her nocturnal peritoneal dialysis treatment.Patient also expressed some ongoing constipation and decrease in her appetite. In the ED patient was found with a low blood pressure, elevated WBCsandCT scan with some improvement in her recent fluid collection and peritonitis changes;no fever. IV fluids were given, cultures taken, IV antibiotics started. Patient was admitted for further work-up.  Assessment & Plan    Principal Problem: Abdominal pain with peritonitis in the setting of peritoneal dialysis/ESRD -PD cultures shows staph epidermidis -Given persistence of abdominal pain, CT scan was obtained which showed persistent 2.2 cm collection at the entry site of catheter and surrounding tenderness -General surgery consulted, infected PD catheter removed on 5/7, transition to hemodialysis -Awaiting outpatient HD center -Continue IV vancomycin , will need 3 weeks of  antibiotics -Will need HD in a.m. and hopefully DC home after hemodialysis if outpatient HD center is arranged.  ESRD on PD -PD catheter removed, patient will need to undergo hemodialysis center. Will need outpatient dialysis center arranged prior to discharge. Nephrology following.  Constipation -Improved with lactulose   Diabetes mellitus type 2 with renal complication -Hemoglobin A1c 7.1, -CBGs stable, continue Lantus and sliding scale insulin  Morbid obesity BMI 47, recommended continue exercise, diet control  Essential hypertension -On presentation BP low likely due to peritonitis now improving -BP still soft, continue to hold antihypertensives  Hyperlipidemia -Not on statins currently  History of chronic respiratory failure, OHS -Stable, O2 sats 100% on 2 L -Chronically at home on 2 L O2   Chronic diastolic CHF -Currently stable, continue HD for volume management  Anxiety -Currently stable, no acute issues  Code Status: Full code DVT Prophylaxis: Heparin subcu Family Communication: Discussed in detail with the patient, all imaging results, lab results explained to the patient.     Disposition Plan: PD catheter removed, needs outpatient HD center/clip, hopefully DC home in a.m. after hemodialysis if outpatient center is arranged  Time Spent in minutes 25 minutes  Procedures:  Hemodialysis Removal of PD catheter on 04/26/2019  Consultants:   Nephrology General surgery  Antimicrobials:   Anti-infectives (From admission, onward)   Start     Dose/Rate  Route Frequency Ordered Stop   04/27/19 1600  vancomycin (VANCOCIN) IVPB 1000 mg/200 mL premix     1,000 mg 200 mL/hr over 60 Minutes Intravenous  Once 04/27/19 1430 04/27/19 2015   04/26/19 1100  vancomycin (VANCOCIN) IVPB 1000 mg/200 mL premix     1,000 mg 200 mL/hr over 60 Minutes Intravenous  Once 04/26/19 1058 04/26/19 1321   04/23/19 1700  vancomycin (VANCOCIN) IVPB 1000 mg/200 mL premix     1,000  mg 200 mL/hr over 60 Minutes Intravenous  Once 04/23/19 1437 04/23/19 1925   04/21/19 2200  piperacillin-tazobactam (ZOSYN) IVPB 2.25 g  Status:  Discontinued     2.25 g 100 mL/hr over 30 Minutes Intravenous Every 12 hours 04/21/19 1127 04/22/19 1137   04/20/19 1730  vancomycin (VANCOCIN) 2,500 mg in sodium chloride 0.9 % 500 mL IVPB     2,500 mg 250 mL/hr over 120 Minutes Intravenous  Once 04/20/19 1633 04/20/19 1927   04/20/19 1645  vancomycin variable dose per unstable renal function (pharmacist dosing)      Does not apply See admin instructions 04/20/19 1645     04/20/19 1000  piperacillin-tazobactam (ZOSYN) IVPB 3.375 g  Status:  Discontinued     3.375 g 12.5 mL/hr over 240 Minutes Intravenous Every 12 hours 04/20/19 0911 04/21/19 1127   04/20/19 0500  ceFEPIme (MAXIPIME) 2 g in sodium chloride 0.9 % 100 mL IVPB     2 g 200 mL/hr over 30 Minutes Intravenous  Once 04/20/19 0447 04/20/19 0609         Medications  Scheduled Meds:  calcitRIOL  0.5 mcg Oral Daily   Chlorhexidine Gluconate Cloth  6 each Topical Q0600   docusate sodium  100 mg Oral BID   gentamicin cream  1 application Topical Daily   heparin  5,000 Units Subcutaneous Q8H   insulin aspart  0-5 Units Subcutaneous QHS   insulin aspart  0-9 Units Subcutaneous TID WC   insulin detemir  3 Units Subcutaneous QHS   lactulose  30 g Oral BID   lanthanum  1,000 mg Oral TID WC   midodrine  10 mg Oral BID WC   multivitamin  1 tablet Oral QHS   vancomycin variable dose per unstable renal function (pharmacist dosing)   Does not apply See admin instructions   Continuous Infusions:  PRN Meds:.acetaminophen, albuterol, ALPRAZolam, HYDROmorphone (DILAUDID) injection, ondansetron (ZOFRAN) IV, oxyCODONE-acetaminophen      Subjective:   [name] was seen and examined today.  BP somewhat soft, asymptomatic, hoping to go home soon once outpatient dialysis center is arranged.  States overnight had bleeding  from the PD catheter area, currently dressing clean, no acute issues.  No fevers or chills.   Patient denies dizziness, chest pain, shortness of breath.   Objective:   Vitals:   04/28/19 1550 04/28/19 2111 04/28/19 2209 04/29/19 0526  BP: (!) 101/55  (!) 86/38 92/61  Pulse: 62 (!) 55  (!) 52  Resp:  20  20  Temp: (!) 97.5 F (36.4 C) 98.1 F (36.7 C)  97.9 F (36.6 C)  TempSrc: Oral Oral  Oral  SpO2: 97% 97%  99%  Weight:    (!) 141.8 kg  Height:        Intake/Output Summary (Last 24 hours) at 04/29/2019 1517 Last data filed at 04/29/2019 0900 Gross per 24 hour  Intake 720 ml  Output --  Net 720 ml     Wt Readings from Last 3 Encounters:  04/29/19 Marland Kitchen)  141.8 kg  01/04/19 (!) 158.3 kg  07/13/18 (!) 166.9 kg   Physical Exam  General: Alert and oriented x 3, NAD  Eyes:   HEENT:    Cardiovascular: S1 S2 clear, no murmurs, RRR. No pedal edema b/l  Respiratory: CTAB, no wheezing, rales or rhonchi  Gastrointestinal: Soft, TTP in the PD catheter area, dressing intact, clean  Ext: no pedal edema bilaterally  Neuro: no new deficits  Musculoskeletal: No cyanosis, clubbing  Skin: No rashes  Psych: Normal affect and demeanor, alert and oriented x3        Data Reviewed:  I have personally reviewed following labs and imaging studies  Micro Results Recent Results (from the past 240 hour(s))  Gram stain     Status: None   Collection Time: 04/20/19  2:30 AM  Result Value Ref Range Status   Specimen Description PERITONEAL DIALYSATE  Final   Special Requests Normal  Final   Gram Stain   Final    CYTOSPIN SMEAR WBC PRESENT, PREDOMINANTLY PMN NO ORGANISMS SEEN RESULT CALLED TO, READ BACK BY AND VERIFIED WITH: J SAPPELT,RN _0  04/20/19 [facility] Performed at [facility], [address] [place], [place] [address]    Report Status 04/20/2019 FINAL  Final  Culture, body fluid-bottle     Status: Abnormal   Collection Time: 04/20/19  2:30 AM  Result Value Ref Range  Status   Specimen Description   Final    PERITONEAL DIALYSATE Performed at [facility], [address], [place], [place] [address]    Special Requests   Final    PERITONEAL DIALYSATE Performed at [facility], [address], [place], [place] [address]    Gram Stain   Final    GRAM POSITIVE COCCI ANAEROBIC BOTTLE Gram Stain Report Called to,Read Back By and Verified With: M. MUELLER _1   04/20/19 [facility] Performed at [facility], [address] [place], [place] [address]    Culture STAPHYLOCOCCUS EPIDERMIDIS (A)  Final   Report Status 04/22/2019 FINAL  Final   Organism ID, Bacteria STAPHYLOCOCCUS EPIDERMIDIS  Final      Susceptibility   Staphylococcus epidermidis - MIC*    CIPROFLOXACIN <=0.5 SENSITIVE Sensitive     ERYTHROMYCIN >=8 RESISTANT Resistant     GENTAMICIN <=0.5 SENSITIVE Sensitive     OXACILLIN >=4 RESISTANT Resistant     TETRACYCLINE 2 SENSITIVE Sensitive     VANCOMYCIN 2 SENSITIVE Sensitive     TRIMETH/SULFA <=10 SENSITIVE Sensitive     CLINDAMYCIN <=0.25 SENSITIVE Sensitive     RIFAMPIN <=0.5 SENSITIVE Sensitive     Inducible Clindamycin NEGATIVE Sensitive     * STAPHYLOCOCCUS EPIDERMIDIS  Stat Gram stain     Status: None   Collection Time: 04/21/19  1:31 AM  Result Value Ref Range Status   Specimen Description PERITONEAL FLUID  Final   Special Requests NONE  Final   Gram Stain   Final    WBC PRESENT, PREDOMINANTLY PMN NO ORGANISMS SEEN CYTOSPIN SMEAR Performed at [facility], [address], [place], [name] [address]    Report Status 04/21/2019 FINAL  Final  Culture, body fluid-bottle     Status: None   Collection Time: 04/21/19  1:33 AM  Result Value Ref Range Status   Specimen Description PERITONEAL FLUID  Final   Special Requests   Final    BOTTLES DRAWN AEROBIC AND ANAEROBIC Blood Culture adequate volume   Culture   Final    NO GROWTH 5 DAYS Performed at [facility], [place] [address], [place], [place] [address]    Report Status  04/26/2019 FINAL  Final  MRSA PCR Screening     Status: None   Collection Time: 04/24/19 11:38 AM  Result Value Ref Range Status   MRSA by PCR NEGATIVE NEGATIVE Final    Comment:        The GeneXpert MRSA Assay (FDA approved for NASAL specimens only), is one component of a comprehensive MRSA colonization surveillance program. It is not intended to diagnose MRSA infection nor to guide or monitor treatment for MRSA infections. Performed at [facility], [place] [address], [place], [place] [address]   Surgical pcr screen     Status: None   Collection Time: 04/26/19  5:07 AM  Result Value Ref Range Status   MRSA, PCR NEGATIVE NEGATIVE Final   Staphylococcus aureus NEGATIVE NEGATIVE Final    Comment: (NOTE) The Xpert SA Assay (FDA approved for NASAL specimens in patients 26 years of age and older), is one component of a comprehensive surveillance program. It is not intended to diagnose infection nor to guide or monitor treatment. Performed at [facility] [facility], [place] [address], [place], [place] [address]     Radiology Reports Ct Abdomen Pelvis Wo Contrast  Result Date: 04/20/2019 CLINICAL DATA:  Initial evaluation for acute abdominal pain, shortness of breath while undergoing peritoneal dialysis. EXAM: CT ABDOMEN AND PELVIS WITHOUT CONTRAST TECHNIQUE: Multidetector CT imaging of the abdomen and pelvis was performed following the standard protocol without IV contrast. COMPARISON:  Prior CT from 12/27/2018. FINDINGS: Lower chest: Atelectatic changes seen dependently within the visualized lung bases. Visualized lungs are otherwise clear. Hepatobiliary: Liver demonstrates a normal unenhanced appearance. Gallbladder absent. No biliary dilatation. Pancreas: Pancreas within normal limits. Spleen: Spleen within normal limits. Adrenals/Urinary Tract: Adrenal glands are normal. Kidneys equal in size without nephrolithiasis or hydronephrosis. No hydroureter. Bladder decompressed without  abnormality. Stomach/Bowel: Stomach within normal limits. No evidence for bowel obstruction. No evidence for acute appendicitis. Mild colonic diverticulosis without evidence for acute diverticulitis. No acute inflammatory changes seen about the bowels. Vascular/Lymphatic: Intra-abdominal aorta of normal caliber. Mild aorto bi-iliac atherosclerotic disease. No adenopathy. Reproductive: Prior hysterectomy.  No adnexal mass. Other: Peritoneal dialysis catheter in place extending via the left lower quadrant, tip position within the left lower quadrant stable from previous. Persistent 2.2 cm collection at the entry site of the catheter, slightly decreased from previous (series 2, image 68). Persistent soft tissue stranding within the partially visualized adjacent abdominal wall extending into the pannus, suggesting persistent cellulitis/panniculitis, improved in appearance relative to previous exam. No soft tissue emphysema. Scattered free air with trace free fluid within the abdomen compatible with dialysis. Musculoskeletal: No acute osseous abnormality. No discrete lytic or blastic osseous lesions. IMPRESSION: 1. Persistent soft tissue stranding within the left anterior abdominal wall and pannus, suggesting possible cellulitis/panniculitis, improved in appearance relative to most recent CT from 12/27/2018. Persistent 2.2 cm collection at the peritoneal dialysis catheter in treat point, slightly decreased from previous as well. 2. No other acute intra-abdominal or pelvic process. 3. Free intraperitoneal air, compatible with peritoneal dialysis. 4. Mild colonic diverticulosis without evidence for acute diverticulitis. Electronically Signed   By: Jeannine Boga M.D.   On: 04/20/2019 04:32   Dg Abd 2 Views  Result Date: 04/23/2019 CLINICAL DATA:  Abdominal pain and vomiting. EXAM: ABDOMEN - 2 VIEW COMPARISON:  CT abdomen and pelvis 04/20/2019. Plain films of the abdomen 12/30/2018. FINDINGS: No free  intraperitoneal air is  identified. The bowel gas pattern is nonobstructive. Radiopaque densities projecting in the right abdomen could be retained contrast in diverticula or medicine. IMPRESSION: No acute finding. Electronically Signed   By: Inge Rise M.D.   On: 04/23/2019 12:09    Lab Data:  CBC: Recent Labs  Lab 04/23/19 0522 04/24/19 0401 04/25/19 0352 04/26/19 0234 04/27/19 0758  WBC 8.8 7.6 8.2 9.2 9.3  NEUTROABS 6.5 5.2 5.2 6.1  --   HGB 10.4* 9.9* 9.2* 9.9* 9.6*  HCT 31.9* 30.6* 29.3* 32.4* 30.4*  MCV 98.5 98.1 98.7 100.6* 101.7*  PLT 275 253 247 244 103   Basic Metabolic Panel: Recent Labs  Lab 04/23/19 0522  04/25/19 0352 04/26/19 0234 04/27/19 0223 04/27/19 0758 04/28/19 0351  NA 138   < > 138 138 137 135 136  K 3.4*   < > 3.8 4.2 4.6 4.1 4.9  CL 104   < > 98 102 102 101 97*  CO2 19*   < > _0 GLUCOSE 79   < > 100* 137* 124* 99 102*  BUN 49*   < > 26* _1 CREATININE 11.14*   < > 7.84* 5.16* 6.44* 6.71* 5.17*  CALCIUM 8.1*   < > 8.8* 8.4* 8.6* 8.7* 8.9  MG 2.1  --   --   --   --   --   --   PHOS  --    < > 4.4 2.6 4.4 4.8* 4.2   < > = values in this interval not displayed.   GFR: Estimated Creatinine Clearance: 18.3 mL/min (A) (by C-G formula based on SCr of 5.17 mg/dL (H)). Liver Function Tests: Recent Labs  Lab 04/25/19 0352 04/26/19 0234 04/27/19 0223 04/27/19 0758 04/28/19 0351  ALBUMIN 1.8* 2.0* 2.0* 2.0* 2.1*   No results for input(s): LIPASE, AMYLASE in the last 168 hours. No results for input(s): AMMONIA in the last 168 hours. Coagulation Profile: No results for input(s): INR, PROTIME in the last 168 hours. Cardiac Enzymes: No results for input(s): CKTOTAL, CKMB, CKMBINDEX, TROPONINI in the last 168 hours. BNP (last 3 results) No results for input(s): PROBNP in the last 8760 hours. HbA1C: No results for input(s): HGBA1C in the last 72 hours. CBG: Recent Labs  Lab 04/28/19 1129 04/28/19 1633 04/28/19 2106  04/29/19 0747 04/29/19 1227  GLUCAP 116* 131* 131* 129* 112*   Lipid Profile: No results for input(s): CHOL, HDL, LDLCALC, TRIG, CHOLHDL, LDLDIRECT in the last 72 hours. Thyroid Function Tests: No results for input(s): TSH, T4TOTAL, FREET4, T3FREE, THYROIDAB in the last 72 hours. Anemia Panel: No results for input(s): VITAMINB12, FOLATE, FERRITIN, TIBC, IRON, RETICCTPCT in the last 72 hours. Urine analysis:    Component Value Date/Time   COLORURINE YELLOW 08/11/2016 1800   APPEARANCEUR HAZY (A) 08/11/2016 1800   LABSPEC 1.020 08/11/2016 1800   PHURINE 5.5 08/11/2016 1800   GLUCOSEU NEGATIVE 08/11/2016 1800   HGBUR MODERATE (A) 08/11/2016 1800   BILIRUBINUR NEGATIVE 08/11/2016 1800   KETONESUR NEGATIVE 08/11/2016 1800   PROTEINUR 100 (A) 08/11/2016 1800   UROBILINOGEN 0.2 12/02/2014 1925   NITRITE NEGATIVE 08/11/2016 1800   LEUKOCYTESUR NEGATIVE 08/11/2016 1800     [name] M.D. [facility] 04/29/2019, 3:17 PM  Pager: [phone] Between 7am to 7pm - call Pager - [phone]  After 7pm go to [email or web address] - password [email or web address]  Call night coverage person covering after 7pm

## 2019-04-30 LAB — CBC
HCT: 29 % — ABNORMAL LOW (ref 36.0–46.0)
Hemoglobin: 8.9 g/dL — ABNORMAL LOW (ref 12.0–15.0)
MCH: 31.4 pg (ref 26.0–34.0)
MCHC: 30.7 g/dL (ref 30.0–36.0)
MCV: 102.5 fL — ABNORMAL HIGH (ref 80.0–100.0)
Platelets: 260 10*3/uL (ref 150–400)
RBC: 2.83 MIL/uL — ABNORMAL LOW (ref 3.87–5.11)
RDW: 15 % (ref 11.5–15.5)
WBC: 8.3 10*3/uL (ref 4.0–10.5)
nRBC: 0 % (ref 0.0–0.2)

## 2019-04-30 LAB — VANCOMYCIN, RANDOM: Vancomycin Rm: 29

## 2019-04-30 LAB — RENAL FUNCTION PANEL
Albumin: 2.2 g/dL — ABNORMAL LOW (ref 3.5–5.0)
Anion gap: 13 (ref 5–15)
BUN: 26 mg/dL — ABNORMAL HIGH (ref 6–20)
CO2: 23 mmol/L (ref 22–32)
Calcium: 8.9 mg/dL (ref 8.9–10.3)
Chloride: 100 mmol/L (ref 98–111)
Creatinine, Ser: 8.78 mg/dL — ABNORMAL HIGH (ref 0.44–1.00)
GFR calc Af Amer: 5 mL/min — ABNORMAL LOW (ref >60)
GFR calc non Af Amer: 5 mL/min — ABNORMAL LOW (ref >60)
Glucose, Bld: 96 mg/dL (ref 70–99)
Phosphorus: 6 mg/dL — ABNORMAL HIGH (ref 2.5–4.6)
Potassium: 4.2 mmol/L (ref 3.5–5.1)
Sodium: 136 mmol/L (ref 135–145)

## 2019-04-30 LAB — GLUCOSE, CAPILLARY: Glucose-Capillary: 107 mg/dL — ABNORMAL HIGH (ref 70–99)

## 2019-04-30 MED ORDER — MIDODRINE HCL 10 MG PO TABS: 10.0000 mg | ORAL_TABLET | Freq: Two times a day (BID) | ORAL | 3 refills | Status: DC

## 2019-04-30 MED ORDER — LIDOCAINE HCL (PF) 1 % IJ SOLN: 5.0000 mL | INTRAMUSCULAR | Status: DC | PRN

## 2019-04-30 MED ORDER — VANCOMYCIN IV (FOR PTA / DISCHARGE USE ONLY): 1000.0000 mg | INTRAVENOUS | 0 refills | Status: DC

## 2019-04-30 MED ORDER — PENTAFLUOROPROP-TETRAFLUOROETH EX AERO: 1.0000 "application " | INHALATION_SPRAY | CUTANEOUS | Status: DC | PRN

## 2019-04-30 MED ORDER — INSULIN DETEMIR 100 UNIT/ML ~~LOC~~ SOLN: 3.0000 [IU] | Freq: Every day | SUBCUTANEOUS | 11 refills | Status: DC

## 2019-04-30 MED ORDER — VANCOMYCIN IV (FOR PTA / DISCHARGE USE ONLY): 1000.0000 mg | INTRAVENOUS | 0 refills | Status: AC

## 2019-04-30 MED ORDER — MIDODRINE HCL 5 MG PO TABS
ORAL_TABLET | ORAL | Status: AC
  Filled 2019-04-30: qty 2

## 2019-04-30 MED ORDER — SODIUM CHLORIDE 0.9 % IV SOLN: 100.0000 mL | INTRAVENOUS | Status: DC | PRN

## 2019-04-30 MED ORDER — LIDOCAINE-PRILOCAINE 2.5-2.5 % EX CREA
1.0000 "application " | TOPICAL_CREAM | CUTANEOUS | Status: DC | PRN
  Filled 2019-04-30: qty 5

## 2019-04-30 MED ORDER — LACTULOSE 10 GM/15ML PO SOLN: 30.0000 g | Freq: Two times a day (BID) | ORAL | 0 refills | Status: DC

## 2019-04-30 MED ORDER — HEPARIN SODIUM (PORCINE) 1000 UNIT/ML DIALYSIS
100.0000 [IU]/kg | INTRAMUSCULAR | Status: DC | PRN
  Filled 2019-04-30: qty 15

## 2019-04-30 NOTE — Progress Notes (Signed)
[facility]    NEPHROLOGY PROGRESS NOTE  SUBJECTIVE: Patient resting on examination today.  Minimal weight gain.  We will only attempt 1 L ultrafiltration on dialysis today.  Denies chest pain, shortness of breath, nausea, vomiting, diarrhea or dysuria.  All other review of systems are negative.  OBJECTIVE:  Vitals:   04/30/19 1130 04/30/19 1154  BP: (!) 93/51 (!) 107/56  Pulse: (!) 56 (!) 59  Resp: 15 16  Temp:  98.3 F (36.8 C)  SpO2: 99% 100%    Intake/Output Summary (Last 24 hours) at 04/30/2019 1232 Last data filed at 04/30/2019 1154 Gross per 24 hour  Intake -  Output 0 ml  Net 0 ml      General:  AAOx3 NAD HEENT: MMM [place] AT anicteric sclera Neck:  No JVD, no adenopathy CV:  Heart RRR  Lungs:  L/S CTA bilaterally Abd:  abd soft, tender diffusely to palpation. GU:  Bladder non-palpable Extremities: Chronic lymphedema changes noted.. Skin:  No skin rash  MEDICATIONS:  . calcitRIOL  0.5 mcg Oral Daily  . Chlorhexidine Gluconate Cloth  6 each Topical Q0600  . docusate sodium  100 mg Oral BID  . gentamicin cream  1 application Topical Daily  . heparin  5,000 Units Subcutaneous Q8H  . insulin aspart  0-5 Units Subcutaneous QHS  . insulin aspart  0-9 Units Subcutaneous TID WC  . insulin detemir  3 Units Subcutaneous QHS  . lactulose  30 g Oral BID  . lanthanum  1,000 mg Oral TID WC  . midodrine  10 mg Oral BID WC  . multivitamin  1 tablet Oral QHS  . vancomycin variable dose per unstable renal function (pharmacist dosing)   Does not apply See admin instructions       LABS:   CBC Latest Ref Rng & Units 04/30/2019 04/27/2019 04/26/2019  WBC 4.0 - 10.5 K/uL 8.3 9.3 9.2  Hemoglobin 12.0 - 15.0 g/dL 8.9(L) 9.6(L) 9.9(L)  Hematocrit 36.0 - 46.0 % 29.0(L) 30.4(L) 32.4(L)  Platelets 150 - 400 K/uL 260 242 244    CMP Latest Ref Rng & Units 04/30/2019 04/28/2019 04/27/2019  Glucose 70 - 99 mg/dL 96 102(H) 99  BUN 6 - 20 mg/dL 26(H) 13 19  Creatinine 0.44 -  1.00 mg/dL 8.78(H) 5.17(H) 6.71(H)  Sodium 135 - 145 mmol/L 136 136 135  Potassium 3.5 - 5.1 mmol/L 4.2 4.9 4.1  Chloride 98 - 111 mmol/L 100 97(L) 101  CO2 22 - 32 mmol/L _0 Calcium 8.9 - 10.3 mg/dL 8.9 8.9 8.7(L)  Total Protein 6.5 - 8.1 g/dL - - -  Total Bilirubin 0.3 - 1.2 mg/dL - - -  Alkaline Phos 38 - 126 U/L - - -  AST 15 - 41 U/L - - -  ALT 0 - 44 U/L - - -    Lab Results  Component Value Date   PTH 376 (H) 12/31/2018   CALCIUM 8.9 04/30/2019   CAION 1.06 (L) 07/26/2017   PHOS 6.0 (H) 04/30/2019       Component Value Date/Time   COLORURINE YELLOW 08/11/2016 1800   APPEARANCEUR HAZY (A) 08/11/2016 1800   LABSPEC 1.020 08/11/2016 1800   PHURINE 5.5 08/11/2016 1800   GLUCOSEU NEGATIVE 08/11/2016 1800   HGBUR MODERATE (A) 08/11/2016 1800   BILIRUBINUR NEGATIVE 08/11/2016 1800   KETONESUR NEGATIVE 08/11/2016 1800   PROTEINUR 100 (A) 08/11/2016 1800   UROBILINOGEN 0.2 12/02/2014 1925   NITRITE NEGATIVE 08/11/2016 1800   LEUKOCYTESUR  NEGATIVE 08/11/2016 1800      Component Value Date/Time   PHART 7.290 (L) 01/24/2017 1140   PCO2ART 41.0 01/24/2017 1140   PO2ART 85 01/24/2017 1140   HCO3 29.2 (H) 08/04/2017 0751   TCO2 31 08/04/2017 0751   ACIDBASEDEF 6.3 (H) 01/24/2017 1140   O2SAT 71.0 08/04/2017 0751       Component Value Date/Time   IRON 35 12/27/2018 0710   TIBC 174 (L) 12/27/2018 0710   FERRITIN 1,085 (H) 12/27/2018 0710   IRONPCTSAT 20 12/27/2018 0710       ASSESSMENT/PLAN:    53 year old female patient with a past medical history significant for congestive heart failure, diabetes, morbid obesity, and end-stage renal disease on peritoneal dialysis with Dr. Lowanda Foster who was admitted with abdominal pain and staph epi peritonitis.  She was not tolerant of peritoneal dialysis, and she was status post removal of her PD catheter on 04/26/2019.  She has been transitioned to hemodialysis.  1.  Staph epi peritoneal dialysis related peritonitis.  Plan for 3  weeks of total antibiotics.  Continue IV vancomycin until 05/13/2019 on dialysis..  Abdominal pain is improving.  2.  End-stage renal disease on peritoneal dialysis.  She is now transition to hemodialysis.  She is hoping to continue a Monday, Wednesday, and Friday dialysis schedule.  Scheduled for [facility] in [place].  Has a fistula.  3.  Secondary hyperparathyroidism.  Continue calcitriol.  Is off binders for a controlled phosphorus.  4.  Hypotension.  On midodrine.     [place], DO, [place]

## 2019-04-30 NOTE — Progress Notes (Signed)
Renal Navigator spoke with MD and [facility] clinic manager in [place]. OP HD clinic aware of antibiotic needed during HD treatments. Discharge Summary faxed to OP HD clinic for continuity of care and patient safety.  [name] Renal Navigator [phone]

## 2019-04-30 NOTE — Progress Notes (Signed)
[facility] Progress Note  4 Days Post-Op  Subjective: CC-  No complaints this morning. Tolerating dressing changes well. Appetite improving. Tolerating diet. Denies n/v. BM x2 yesterday.  Objective: Vital signs in last 24 hours: Temp:  [97.7 F (36.5 C)-98.5 F (36.9 C)] 97.7 F (36.5 C) (05/11 0504) Pulse Rate:  [55-78] 58 (05/11 0504) Resp:  [20] 20 (05/11 0504) BP: (98-110)/(53-70) 102/62 (05/11 0504) SpO2:  [97 %-99 %] 99 % (05/11 0504) Weight:  [142.7 kg] 142.7 kg (05/11 0504) Last BM Date: 04/28/19  Intake/Output from previous day: 05/10 0701 - 05/11 0700 In: 240 [P.O.:240] Out: -  Intake/Output this shift: No intake/output data recorded.  PE: Gen:  Alert, NAD, pleasant HEENT: EOM's intact, pupils equal and round Pulm:  effort normal Abd: obese, soft, nontender, 4 separate open incisions cdi with no erythema or drainage Skin: warm and dry   Lab Results:  No results for input(s): WBC, HGB, HCT, PLT in the last 72 hours. BMET Recent Labs    04/28/19 0351  NA 136  K 4.9  CL 97*  CO2 26  GLUCOSE 102*  BUN 13  CREATININE 5.17*  CALCIUM 8.9   PT/INR No results for input(s): LABPROT, INR in the last 72 hours. CMP     Component Value Date/Time   NA 136 04/28/2019 0351   K 4.9 04/28/2019 0351   CL 97 (L) 04/28/2019 0351   CO2 26 04/28/2019 0351   GLUCOSE 102 (H) 04/28/2019 0351   BUN 13 04/28/2019 0351   CREATININE 5.17 (H) 04/28/2019 0351   CALCIUM 8.9 04/28/2019 0351   CALCIUM 8.5 09/06/2012 1056   PROT 6.5 04/20/2019 0229   ALBUMIN 2.1 (L) 04/28/2019 0351   AST 14 (L) 04/20/2019 0229   ALT 14 04/20/2019 0229   ALKPHOS 136 (H) 04/20/2019 0229   BILITOT 0.6 04/20/2019 0229   GFRNONAA 9 (L) 04/28/2019 0351   GFRAA 10 (L) 04/28/2019 0351   Lipase     Component Value Date/Time   LIPASE 24 04/20/2019 0229       Studies/Results: No results found.  Anti-infectives: Anti-infectives (From admission, onward)   Start      Dose/Rate Route Frequency Ordered Stop   04/27/19 1600  vancomycin (VANCOCIN) IVPB 1000 mg/200 mL premix     1,000 mg 200 mL/hr over 60 Minutes Intravenous  Once 04/27/19 1430 04/27/19 2015   04/26/19 1100  vancomycin (VANCOCIN) IVPB 1000 mg/200 mL premix     1,000 mg 200 mL/hr over 60 Minutes Intravenous  Once 04/26/19 1058 04/26/19 1321   04/23/19 1700  vancomycin (VANCOCIN) IVPB 1000 mg/200 mL premix     1,000 mg 200 mL/hr over 60 Minutes Intravenous  Once 04/23/19 1437 04/23/19 1925   04/21/19 2200  piperacillin-tazobactam (ZOSYN) IVPB 2.25 g  Status:  Discontinued     2.25 g 100 mL/hr over 30 Minutes Intravenous Every 12 hours 04/21/19 1127 04/22/19 1137   04/20/19 1730  vancomycin (VANCOCIN) 2,500 mg in sodium chloride 0.9 % 500 mL IVPB     2,500 mg 250 mL/hr over 120 Minutes Intravenous  Once 04/20/19 1633 04/20/19 1927   04/20/19 1645  vancomycin variable dose per unstable renal function (pharmacist dosing)      Does not apply See admin instructions 04/20/19 1645     04/20/19 1000  piperacillin-tazobactam (ZOSYN) IVPB 3.375 g  Status:  Discontinued     3.375 g 12.5 mL/hr over 240 Minutes Intravenous Every 12 hours 04/20/19 0911 04/21/19 1127  04/20/19 0500  ceFEPIme (MAXIPIME) 2 g in sodium chloride 0.9 % 100 mL IVPB     2 g 200 mL/hr over 30 Minutes Intravenous  Once 04/20/19 0447 04/20/19 0609       Assessment/Plan CHF CAD IDDM Morbid obesity  ESRDpreviously onPD, now on HD Abdominal Wall Cellulitis  Infected PD Catheter  S/p Removal of infected peritoneal dialysis catheter 5/7 Dr. Brantley Stage - POD 4 - Patient stable for discharge from surgical standpoint. Continue daily dressing changes. Ok to shower with wounds open. Discharge instructions and follow up information on AVS. General surgery will sign off, please call us with any concerns.  ID - Vancomycin 5/1 >>  FEN - CM/renal diet VTE - SCDs, sq heparin Foley - none Follow up - [facility] clinic    LOS: 10 days     Wellington [place] , [facility] 04/30/2019, 8:05 AM Pager: [phone] Mon-Thurs 7:00 am-4:30 pm Fri 7:00 am -11:30 AM Sat-Sun 7:00 am-11:30 am

## 2019-04-30 NOTE — TOC Transition Note (Signed)
Transition of Care [facility]) - CM/SW Discharge Note   Patient Details  Name: [name] MRN: [id number] Date of Birth: 13-Jun-1966  Transition of Care [facility] 2) CM/SW Contact:  Bethena Roys, RN Phone Number: 04/30/2019, 2:17 PM   Clinical Narrative:  Pt plan for transition home today. Transitioned from PD to HD- Outpatient HD center has accepted the patient. Patient has PCP and is able to get medications without any issues. PTA from home with husband. Husband will transport home after work. No further needs from CM at this time.     Final next level of care: [place] Barriers to Discharge: No Barriers Identified   Patient Goals and CMS Choice Patient states their goals for this hospitalization and ongoing recovery are:: to go back home CMS Medicare.gov Compare Post Acute Care list provided to:: (Patient has been with [facility]- did not need per patient. ) Choice offered to / list presented to : [facility])   Discharge Plan and Services In-house Referral: NA Discharge Planning Services: CM Consult Post Acute Care Choice: NA          DME Arranged: N/A DME Agency: NA       HH Arranged: RN [place] Agency: [place] ([place]) Date HH Agency Contacted: 04/30/19 Time [place]: [place] Representative spoke with at [place]: [place] ([place]) Interventions     Readmission Risk Interventions Readmission Risk Prevention Plan 04/30/2019  Transportation Screening Complete  Medication Review Press photographer) Complete  PCP or Specialist appointment within 3-5 days of discharge Complete  HRI or [place] Complete  SW Recovery Care/Counseling Consult Complete  [place] Not Applicable  Some recent data might be hidden

## 2019-04-30 NOTE — Progress Notes (Signed)
Renal Navigator still waiting to hear from [facility] Admissions. Renal Navigator sent message directly to [facility] OP HD clinic.   Alphonzo Cruise Renal Navigator [phone]

## 2019-04-30 NOTE — Progress Notes (Signed)
Renal Navigator submitted labs (HepB and Quantiferon) that were requested by [facility] Admissions (still pending as of Friday 04/27/19 when referral submitted) and requests a return call as soon as possible, reporting that patient is ready for discharge once OP HD seat can be scheduled. Renal Navigator will contact [place] clinic directly if Admissions Coordinator does not call soon.  Alphonzo Cruise Renal Navigator [phone]

## 2019-04-30 NOTE — Discharge Summary (Signed)
Physician Discharge Summary   Patient ID: [name] MRN: [id number] DOB/AGE: [birth year] 53 y.o.  Admit date: 04/20/2019 Discharge date: 04/30/2019  Primary Care Physician:  [name], MD   Recommendations for Outpatient Follow-up:  1. Follow up with PCP in 1-2 weeks 2. Continue vancomycin 1 g with hemodialysis Monday Wednesday Friday, stop date on 05/14/2019 3. Patient has been accepted to [facility] for [facility] at [facility], Monday Wednesday Friday, 6 AM.  Home Health: No Equipment/Devices:   Discharge Condition: stable CODE STATUS: FULL Diet recommendation: Carb modified diet   Discharge Diagnoses:       ESRD now converted to hemodialysis from PD . Acute abdominal pain . Peritonitis ([place]) in the setting of peritoneal dialysis catheter infection . Obesity hypoventilation syndrome ([place]) . Morbid obesity ([place]) . Hypokalemia . HTN (hypertension) . HLD (hyperlipidemia) . Chronic diastolic CHF (congestive heart failure) ([place])   Consults:  Nephrology General surgery    Allergies:   Allergies  Allergen Reactions  . Contrast Media [Iodinated Diagnostic Agents] Anaphylaxis, Hives, Swelling and Other (See Comments)    Dye for cardiac cath. Tongue swells  . Pneumococcal Vaccines Swelling and Other (See Comments)    Turns skin black, and bodily swelling  . Vancomycin Nausea And Vomiting and Other (See Comments)    Infusion "made me feel like I was dying" had to be readmitted to hospital     DISCHARGE MEDICATIONS: Allergies as of 04/30/2019      Reactions   Contrast Media [iodinated Diagnostic Agents] Anaphylaxis, Hives, Swelling, Other (See Comments)   Dye for cardiac cath. Tongue swells   Pneumococcal Vaccines Swelling, Other (See Comments)   Turns skin black, and bodily swelling   Vancomycin Nausea And Vomiting, Other (See Comments)   Infusion "made me feel like I was dying" had to be readmitted to hospital      Medication List     STOP taking these medications   amLODipine 10 MG tablet Commonly known as:  NORVASC   Potassium Chloride ER 20 MEQ Tbcr     TAKE these medications   acetaminophen 325 MG tablet Commonly known as:  TYLENOL Take 650 mg by mouth every 6 (six) hours as needed for moderate pain or fever.   albuterol (2.5 MG/3ML) 0.083% nebulizer solution Commonly known as:  PROVENTIL Take 2.5 mg by nebulization every 6 (six) hours as needed for wheezing or shortness of breath.   ALPRAZolam 0.5 MG tablet Commonly known as:  XANAX Take 0.5 mg by mouth 3 (three) times daily as needed for anxiety or sleep.   blood glucose meter kit and supplies Kit Dispense based on patient and insurance preference. Use up to four times daily as directed. (FOR ICD-9 250.00, 250.01).   calcitRIOL 0.5 MCG capsule Commonly known as:  ROCALTROL Take 0.5 mcg by mouth daily.   diphenhydrAMINE 25 mg capsule Commonly known as:  BENADRYL Take 25 mg by mouth every 6 (six) hours as needed for allergies.   insulin detemir 100 UNIT/ML injection Commonly known as:  LEVEMIR Inject 0.03 mLs (3 Units total) into the skin daily. What changed:  how much to take   lactulose 10 GM/15ML solution Commonly known as:  CHRONULAC Take 45 mLs (30 g total) by mouth 2 (two) times daily. For constipation   lanthanum 500 MG chewable tablet Commonly known as:  FOSRENOL Chew 1,000-1,500 mg by mouth 5 (five) times daily. Patient takes 3 tablets by mouth three times a day with meals and 2 tablets by  mouth twice a day with snacks   midodrine 10 MG tablet Commonly known as:  PROAMATINE Take 1 tablet (10 mg total) by mouth 2 (two) times daily with a meal.   multivitamin Tabs tablet Take 1 tablet by mouth daily.   Oxycodone HCl 10 MG Tabs Take 1 tablet (10 mg total) by mouth 4 (four) times daily as needed (pain).   senna 8.6 MG tablet Commonly known as:  SENOKOT Take 1 tablet by mouth daily.   vancomycin  IVPB Inject 1,000 mg into the  vein every Monday, Wednesday, and Friday with hemodialysis for 14 days. Indication:  peritonitis Last Day of Therapy:  05/14/19 Labs - _0 /11/20 1226           Brief H and P: For complete details please refer to admission H and P, but in brief Patient is a 53 year old female with anxiety, chronic diastolic heart failure, history of fibromyalgia, type 2 diabetes mellitus chronically on insulin with nephropathy, chronic respiratory failure, morbid obesity with hypoventilation syndromeandend-stage renal disease on peritoneal dialysis;who presented to the emergency department secondary to acute development of severe abdominal pain (left lower  quadrant), 9/10 with associated nausea and vomiting. Patient reports partially being able to initiate her nocturnal peritoneal dialysis treatment.Patient also expressed some ongoing constipation and decrease in her appetite. In the ED patient was found with a low blood pressure, elevated WBCsandCT scan with some improvement in her recent fluid collection and peritonitis changes;no fever. IV fluids were given, cultures taken, IV antibiotics started. Patient was admitted for further work-up.   Hospital Course:  Acute abdominal pain with peritonitis in the setting of peritoneal dialysis/ESRD -PD cultures shows staph epidermidis -Given persistence of abdominal pain, CT scan was obtained which showed persistent 2.2 cm collection at the entry site of catheter and surrounding tenderness -General surgery was consulted, infected PD catheter was removed on 5/7, patient was transitioned to hemodialysis. Continue IV vancomycin for total of 3 weeks, with hemodialysis, Monday Wednesday Friday, stop date on 05/14/2019 -Patient has been accepted to outpatient dialysis center at [facility], [place], Monday Wednesday Friday schedule  ESRD on PD -PD catheter removed, patient will continue hemodialysis Monday  Wednesday Friday  Constipation -Improved with lactulose  Diabetes mellitus type 2 with renal complication -Hemoglobin A1c 7.1, -CBGs stable, continue Lantus, decrease to 3 units at bedtime  Morbid obesity BMI 47, recommended continue exercise, diet control  Essential hypertension -On presentation BP low likely due to peritonitis now improving -BP was soft, discontinued amlodipine, continue midodrine  Hyperlipidemia -Not on statins currently  History of chronic respiratory failure, OHS -Stable, O2 sats 100% on 2 L -Chronically at home on 2 L O2   Chronic diastolic CHF -Currently stable, continue HD for volume management  Anxiety -Currently stable, no acute  issues  Day of Discharge S: No acute issues, hoping to go home today, seen during hemodialysis, tolerating well.  BP (!) 107/56 (BP Location: Right Arm)   Pulse (!) 59   Temp 98.3 F (36.8 C) (Oral)   Resp 16   Ht _0  (1.651 m)   Wt (!) 141.6 kg   SpO2 100%   BMI 51.95 kg/m   Physical Exam: General: Alert and awake oriented x3 not in any acute distress. HEENT: anicteric sclera, pupils reactive to light and accommodation CVS: S1-S2 clear no murmur rubs or gallops Chest: clear to auscultation bilaterally, no wheezing rales or rhonchi Abdomen: soft mildly tender, nondistended, normal bowel sounds, dressing intact Extremities: no cyanosis, clubbing or edema noted bilaterally Neuro: Cranial nerves II-XII intact, no focal neurological deficits   The results of significant diagnostics from this hospitalization (including imaging, microbiology, ancillary and laboratory) are listed below for reference.      Procedures/Studies:  Ct Abdomen Pelvis Wo Contrast  Result Date: 04/20/2019 CLINICAL DATA:  Initial evaluation for acute abdominal pain, shortness of breath while undergoing peritoneal dialysis. EXAM: CT ABDOMEN AND PELVIS WITHOUT CONTRAST TECHNIQUE: Multidetector CT imaging of the abdomen and pelvis was performed following the standard protocol without IV contrast. COMPARISON:  Prior CT from 12/27/2018. FINDINGS: Lower chest: Atelectatic changes seen dependently within the visualized lung bases. Visualized lungs are otherwise clear. Hepatobiliary: Liver demonstrates a normal unenhanced appearance. Gallbladder absent. No biliary dilatation. Pancreas: Pancreas within normal limits. Spleen: Spleen within normal limits. Adrenals/Urinary Tract: Adrenal glands are normal. Kidneys equal in size without nephrolithiasis or hydronephrosis. No hydroureter. Bladder decompressed without abnormality. Stomach/Bowel: Stomach within normal limits. No evidence for bowel obstruction. No evidence for acute  appendicitis. Mild colonic diverticulosis without evidence for acute diverticulitis. No acute inflammatory changes seen about the bowels. Vascular/Lymphatic: Intra-abdominal aorta of normal caliber. Mild aorto bi-iliac atherosclerotic disease. No adenopathy. Reproductive: Prior hysterectomy.  No adnexal mass. Other: Peritoneal dialysis catheter in place extending via the left lower quadrant, tip position within the left lower quadrant stable from previous. Persistent 2.2 cm collection at the entry site of the catheter, slightly decreased from previous (series 2, image 68). Persistent soft tissue stranding within the partially visualized adjacent abdominal wall extending into the pannus, suggesting persistent cellulitis/panniculitis, improved in appearance relative to previous exam. No soft tissue emphysema. Scattered free air with trace free fluid within the abdomen compatible with dialysis. Musculoskeletal: No acute osseous abnormality. No discrete lytic or blastic osseous lesions. IMPRESSION: 1. Persistent soft tissue stranding within the left anterior abdominal wall and pannus, suggesting possible cellulitis/panniculitis, improved in appearance relative to most recent CT from 12/27/2018. Persistent 2.2 cm collection at the peritoneal dialysis catheter in treat point, slightly decreased from previous as well. 2. No other acute intra-abdominal or pelvic process. 3. Free intraperitoneal air, compatible with peritoneal dialysis. 4. Mild colonic diverticulosis without evidence for acute diverticulitis. Electronically  Signed   By: Jeannine Boga M.D.   On: 04/20/2019 04:32   Dg Abd 2 Views  Result Date: 04/23/2019 CLINICAL DATA:  Abdominal pain and vomiting. EXAM: ABDOMEN - 2 VIEW COMPARISON:  CT abdomen and pelvis 04/20/2019. Plain films of the abdomen 12/30/2018. FINDINGS: No free intraperitoneal air is identified. The bowel gas pattern is nonobstructive. Radiopaque densities projecting in the right abdomen  could be retained contrast in diverticula or medicine. IMPRESSION: No acute finding. Electronically Signed   By: Inge Rise M.D.   On: 04/23/2019 12:09       LAB RESULTS: Basic Metabolic Panel: Recent Labs  Lab 04/28/19 0351 04/30/19 0830  NA 136 136  K 4.9 4.2  CL 97* 100  CO2 26 23  GLUCOSE 102* 96  BUN 13 26*  CREATININE 5.17* 8.78*  CALCIUM 8.9 8.9  PHOS 4.2 6.0*   Liver Function Tests: Recent Labs  Lab 04/28/19 0351 04/30/19 0830  ALBUMIN 2.1* 2.2*   No results for input(s): LIPASE, AMYLASE in the last 168 hours. No results for input(s): AMMONIA in the last 168 hours. CBC: Recent Labs  Lab 04/26/19 0234 04/27/19 0758 04/30/19 0830  WBC 9.2 9.3 8.3  NEUTROABS 6.1  --   --   HGB 9.9* 9.6* 8.9*  HCT 32.4* 30.4* 29.0*  MCV 100.6* 101.7* 102.5*  PLT 244 242 260   Cardiac Enzymes: No results for input(s): CKTOTAL, CKMB, CKMBINDEX, TROPONINI in the last 168 hours. BNP: Invalid input(s): POCBNP CBG: Recent Labs  Lab 04/29/19 2059 04/30/19 1229  GLUCAP 102* 107*      Disposition and Follow-up: Discharge Instructions    Diet Carb Modified   Complete by:  As directed    Home infusion instructions [facility] May follow [facility] Dosing Protocol; May administer Cathflo as needed to maintain patency of vascular access device.; Flushing of vascular access device: per [facility] Protocol: 0.9% NaCl pre/post medica...   Complete by:  As directed    Instructions:  May follow [place] Dosing Protocol   Instructions:  May administer Cathflo as needed to maintain patency of vascular access device.   Instructions:  Flushing of vascular access device: per [facility] Protocol: 0.9% NaCl pre/post medication administration and prn patency; Heparin 100 u/ml, 23m for implanted ports and Heparin 10u/ml, 524mfor all other central venous catheters.   Instructions:  May follow [facility] Anaphylaxis Protocol for First Dose Administration in the home: 0.9% NaCl at 25-50 ml/hr  to maintain IV access for protocol meds. Epinephrine 0.3 ml IV/IM PRN and Benadryl 25-50 IV/IM PRN s/s of anaphylaxis.   Instructions:  [facility] Coordinator (RN) to assist per patient IV care needs in the home PRN.   Increase activity slowly   Complete by:  As directed        DISPOSITION: Home  DISCHARGE FOLLOW-UP Follow-up Information    HaSinda DuMD.   Specialty:  Pulmonary Disease Contact information: [phone]        BeFran LowesMD.   Specialty:  Nephrology Contact information: [phone]. [phone]        Surgery, CeMorganGo on 05/15/2019.   Specialty:  General Surgery Why:  5/26 at 10am. Please arrive at 930am for paperwork. Please bring a copy of your photo ID and insurance card to your appointment.  Contact information: [address] [phone]  Time coordinating discharge:  35 minutes  Signed:   Estill Cotta M.D. [facility] 04/30/2019, 12:41 PM

## 2019-04-30 NOTE — Progress Notes (Signed)
Patient has been accepted to [facility] in [place] for OP HD with seat schedule of MWF at 6:15am. Patient should arrive at 6:00am. Renal Navigator notified medical team and patient.  [address], Linna Hoff [phone]

## 2019-04-30 NOTE — Progress Notes (Signed)
PHARMACY CONSULT NOTE FOR:  OUTPATIENT  PARENTERAL ANTIBIOTIC THERAPY (OPAT)  Indication: Peritonitis Regimen: Vancomycin 1 g IV qHD - Mon/Wed/Fri End date: 05/13/19  IV antibiotic discharge orders are pended. To discharging provider:  please sign these orders via discharge navigator,  Select New Orders & click on the button choice - Manage This Unsigned Work.     [name], Jake Church 04/30/2019, 12:21 PM

## 2019-05-01 DIAGNOSIS — Z992 Dependence on renal dialysis: Secondary | ICD-10-CM | POA: Diagnosis not present

## 2019-05-01 DIAGNOSIS — I110 Hypertensive heart disease with heart failure: Secondary | ICD-10-CM | POA: Diagnosis not present

## 2019-05-01 DIAGNOSIS — B957 Other staphylococcus as the cause of diseases classified elsewhere: Secondary | ICD-10-CM | POA: Diagnosis not present

## 2019-05-01 DIAGNOSIS — I5032 Chronic diastolic (congestive) heart failure: Secondary | ICD-10-CM | POA: Diagnosis not present

## 2019-05-01 DIAGNOSIS — T8571XA Infection and inflammatory reaction due to peritoneal dialysis catheter, initial encounter: Secondary | ICD-10-CM | POA: Diagnosis not present

## 2019-05-01 DIAGNOSIS — E1122 Type 2 diabetes mellitus with diabetic chronic kidney disease: Secondary | ICD-10-CM | POA: Diagnosis not present

## 2019-05-01 DIAGNOSIS — N186 End stage renal disease: Secondary | ICD-10-CM | POA: Diagnosis not present

## 2019-05-02 DIAGNOSIS — E1122 Type 2 diabetes mellitus with diabetic chronic kidney disease: Secondary | ICD-10-CM | POA: Diagnosis not present

## 2019-05-02 DIAGNOSIS — I110 Hypertensive heart disease with heart failure: Secondary | ICD-10-CM | POA: Diagnosis not present

## 2019-05-02 DIAGNOSIS — Z992 Dependence on renal dialysis: Secondary | ICD-10-CM | POA: Diagnosis not present

## 2019-05-02 DIAGNOSIS — B957 Other staphylococcus as the cause of diseases classified elsewhere: Secondary | ICD-10-CM | POA: Diagnosis not present

## 2019-05-02 DIAGNOSIS — N186 End stage renal disease: Secondary | ICD-10-CM | POA: Diagnosis not present

## 2019-05-02 DIAGNOSIS — T8571XA Infection and inflammatory reaction due to peritoneal dialysis catheter, initial encounter: Secondary | ICD-10-CM | POA: Diagnosis not present

## 2019-05-04 DIAGNOSIS — K659 Peritonitis, unspecified: Secondary | ICD-10-CM | POA: Diagnosis not present

## 2019-05-04 DIAGNOSIS — E1121 Type 2 diabetes mellitus with diabetic nephropathy: Secondary | ICD-10-CM | POA: Diagnosis not present

## 2019-05-04 DIAGNOSIS — I120 Hypertensive chronic kidney disease with stage 5 chronic kidney disease or end stage renal disease: Secondary | ICD-10-CM | POA: Diagnosis not present

## 2019-05-04 DIAGNOSIS — I890 Lymphedema, not elsewhere classified: Secondary | ICD-10-CM | POA: Diagnosis not present

## 2019-05-08 DIAGNOSIS — N186 End stage renal disease: Secondary | ICD-10-CM | POA: Diagnosis not present

## 2019-05-08 DIAGNOSIS — E1122 Type 2 diabetes mellitus with diabetic chronic kidney disease: Secondary | ICD-10-CM | POA: Diagnosis not present

## 2019-05-08 DIAGNOSIS — Z992 Dependence on renal dialysis: Secondary | ICD-10-CM | POA: Diagnosis not present

## 2019-05-08 DIAGNOSIS — T8571XA Infection and inflammatory reaction due to peritoneal dialysis catheter, initial encounter: Secondary | ICD-10-CM | POA: Diagnosis not present

## 2019-05-08 DIAGNOSIS — B957 Other staphylococcus as the cause of diseases classified elsewhere: Secondary | ICD-10-CM | POA: Diagnosis not present

## 2019-05-08 DIAGNOSIS — I110 Hypertensive heart disease with heart failure: Secondary | ICD-10-CM | POA: Diagnosis not present

## 2019-05-11 ENCOUNTER — Emergency Department (HOSPITAL_COMMUNITY)
Admission: EM | Admit: 2019-05-11 | Discharge: 2019-05-11 | Disposition: A | Discharge status: Home / Self Care | Payer: Self-pay | Attending: Emergency Medicine | Admitting: Emergency Medicine

## 2019-05-11 ENCOUNTER — Emergency Department (HOSPITAL_COMMUNITY): Payer: Self-pay

## 2019-05-11 ENCOUNTER — Other Ambulatory Visit: Payer: Self-pay

## 2019-05-11 ENCOUNTER — Encounter (HOSPITAL_COMMUNITY): Payer: Self-pay

## 2019-05-11 DIAGNOSIS — I5032 Chronic diastolic (congestive) heart failure: Secondary | ICD-10-CM | POA: Insufficient documentation

## 2019-05-11 DIAGNOSIS — I2510 Atherosclerotic heart disease of native coronary artery without angina pectoris: Secondary | ICD-10-CM | POA: Insufficient documentation

## 2019-05-11 DIAGNOSIS — Z794 Long term (current) use of insulin: Secondary | ICD-10-CM | POA: Insufficient documentation

## 2019-05-11 DIAGNOSIS — N186 End stage renal disease: Secondary | ICD-10-CM | POA: Insufficient documentation

## 2019-05-11 DIAGNOSIS — Z79899 Other long term (current) drug therapy: Secondary | ICD-10-CM | POA: Insufficient documentation

## 2019-05-11 DIAGNOSIS — I517 Cardiomegaly: Secondary | ICD-10-CM | POA: Diagnosis not present

## 2019-05-11 DIAGNOSIS — J810 Acute pulmonary edema: Secondary | ICD-10-CM | POA: Diagnosis not present

## 2019-05-11 DIAGNOSIS — R509 Fever, unspecified: Secondary | ICD-10-CM | POA: Insufficient documentation

## 2019-05-11 DIAGNOSIS — Z992 Dependence on renal dialysis: Secondary | ICD-10-CM | POA: Insufficient documentation

## 2019-05-11 DIAGNOSIS — Z03818 Encounter for observation for suspected exposure to other biological agents ruled out: Secondary | ICD-10-CM | POA: Insufficient documentation

## 2019-05-11 DIAGNOSIS — Z20828 Contact with and (suspected) exposure to other viral communicable diseases: Secondary | ICD-10-CM | POA: Diagnosis not present

## 2019-05-11 DIAGNOSIS — I132 Hypertensive heart and chronic kidney disease with heart failure and with stage 5 chronic kidney disease, or end stage renal disease: Secondary | ICD-10-CM | POA: Insufficient documentation

## 2019-05-11 DIAGNOSIS — J45909 Unspecified asthma, uncomplicated: Secondary | ICD-10-CM | POA: Insufficient documentation

## 2019-05-11 DIAGNOSIS — E1122 Type 2 diabetes mellitus with diabetic chronic kidney disease: Secondary | ICD-10-CM | POA: Insufficient documentation

## 2019-05-11 LAB — CBC WITH DIFFERENTIAL/PLATELET
Abs Immature Granulocytes: 0.02 10*3/uL (ref 0.00–0.07)
Basophils Absolute: 0 10*3/uL (ref 0.0–0.1)
Basophils Relative: 1 %
Eosinophils Absolute: 0 10*3/uL (ref 0.0–0.5)
Eosinophils Relative: 1 %
HCT: 28.6 % — ABNORMAL LOW (ref 36.0–46.0)
Hemoglobin: 8.8 g/dL — ABNORMAL LOW (ref 12.0–15.0)
Immature Granulocytes: 1 %
Lymphocytes Relative: 24 %
Lymphs Abs: 0.9 10*3/uL (ref 0.7–4.0)
MCH: 30.8 pg (ref 26.0–34.0)
MCHC: 30.8 g/dL (ref 30.0–36.0)
MCV: 100 fL (ref 80.0–100.0)
Monocytes Absolute: 0.3 10*3/uL (ref 0.1–1.0)
Monocytes Relative: 8 %
Neutro Abs: 2.5 10*3/uL (ref 1.7–7.7)
Neutrophils Relative %: 65 %
Platelets: 201 10*3/uL (ref 150–400)
RBC: 2.86 MIL/uL — ABNORMAL LOW (ref 3.87–5.11)
RDW: 14.6 % (ref 11.5–15.5)
WBC: 3.7 10*3/uL — ABNORMAL LOW (ref 4.0–10.5)
nRBC: 0 % (ref 0.0–0.2)

## 2019-05-11 LAB — BASIC METABOLIC PANEL
Anion gap: 14 (ref 5–15)
BUN: 34 mg/dL — ABNORMAL HIGH (ref 6–20)
CO2: 25 mmol/L (ref 22–32)
Calcium: 7.7 mg/dL — ABNORMAL LOW (ref 8.9–10.3)
Chloride: 97 mmol/L — ABNORMAL LOW (ref 98–111)
Creatinine, Ser: 10.76 mg/dL — ABNORMAL HIGH (ref 0.44–1.00)
GFR calc Af Amer: 4 mL/min — ABNORMAL LOW (ref >60)
GFR calc non Af Amer: 4 mL/min — ABNORMAL LOW (ref >60)
Glucose, Bld: 103 mg/dL — ABNORMAL HIGH (ref 70–99)
Potassium: 5 mmol/L (ref 3.5–5.1)
Sodium: 136 mmol/L (ref 135–145)

## 2019-05-11 LAB — LACTIC ACID, PLASMA
Lactic Acid, Venous: 0.9 mmol/L (ref 0.5–1.9)
Lactic Acid, Venous: 1 mmol/L (ref 0.5–1.9)

## 2019-05-11 LAB — SARS CORONAVIRUS 2 BY RT PCR (HOSPITAL ORDER, PERFORMED IN ~~LOC~~ HOSPITAL LAB): SARS Coronavirus 2: NEGATIVE

## 2019-05-11 NOTE — ED Notes (Signed)
Pt states she does not produce urine, not able to collect urine sample at this time.

## 2019-05-11 NOTE — ED Triage Notes (Signed)
Pt arrived from home via POV due to fever since Tuesday. Pt took 641m Tylenol at 0430 today, but could not get Dialysis due to a current temp of 100.57F. Pt missed last 2 Dialysis appts due to fever. Dialysis center told Pt they willl not allow her in facility until fever is down and covid-19 testing performed. Pt chronically on 2L O2 [place] Pt's left arm restricted for fistula.

## 2019-05-11 NOTE — ED Provider Notes (Signed)
[facility] EMERGENCY DEPARTMENT Provider Note   CSN: [id number] Arrival date & time: 05/11/19  0559    History   Chief Complaint Chief Complaint  Patient presents with   Fever    HPI SAORI UMHOLTZ is a 53 y.o. female.     HPI Patient undergoes hemodialysis every Monday, Wednesday and Friday.  Was last dialyzed on Monday.  States she developed a low-grade temperature on Tuesday cannot receive dialysis either Wednesday or this morning.  She denies any associated symptoms including cough or shortness of breath.  No abdominal pain, nausea or vomiting.  Patient was recently hospitalized for infected peritoneal catheter which was removed.  She still has 4 surgical wounds on her abdomen which she receives wound care for and change his dressings daily.  There is been no drainage or increased redness or tenderness at the sites.  Patient states she is still receiving IV vancomycin during dialysis.  She has had no known COVID-19 exposure.  Past Medical History:  Diagnosis Date   Anxiety    Asthma    as a child   Cancer ([place])    thyroid   Cellulitis    CHF (congestive heart failure) ([place])    Chiari malformation    s/p surgery   Chiari malformation    Chronic pain    Complication of anesthesia 11/28/15   Resp arrest after  conscious  sedation   Constipation    Coronary artery disease    40-50% mid LAD 04/29/09, Medical tx. (Dr. Gwenlyn Found)   Diabetes mellitus    Type II- reports being off all medication d/t it being controlled   Fibromyalgia    History of blood transfusion    hemorrage duinrg pregancy   Hx of echocardiogram 10/2011   EF 55-60%   Hypercholesterolemia    Hypertension    Lymph edema    Obesity hypoventilation syndrome ([place])    On home oxygen therapy    "2L; 24/7" (12/21/2018)   Pneumonia    in past   Pulmonary hypertension ([place])    Renal disorder    M/W/F [facility] started dialysis in Dec.2016   S/P colonoscopy 05/26/2007   Dr.  Laural Golden sigmoid diverticulosis random biopsies benign   S/P endoscopy 05/01/2009   Dr. Penelope Coop pill-induced esophageal ulcerations distal to midesophagus, 2 small ulcers in the antrum of the stomach   Shortness of breath dyspnea    with any exertion or if heart rate  is irregular while on dialysis   Sleep apnea    reports that she no longer needs CPAP due to weight loss    Patient Active Problem List   Diagnosis Date Noted   Abdominal pain 04/20/2019   Peritonitis ([place]) 12/21/2018   Abdominal pain, LLQ 12/21/2018   Influenza B 12/21/2018   SOB (shortness of breath) 06/27/2018   Hypokalemia 06/27/2018   Chronic renal failure    Septic shock ([place]) 01/24/2017   Shock liver 01/24/2017   Cor pulmonale, chronic (HCC) 01/24/2017   Elevated troponin    Flu-like symptoms 01/18/2017   Fever 01/18/2017   HCAP (healthcare-associated pneumonia) 12/31/2016   Numbness and tingling in right hand 08/13/2016   Sepsis ([place]) 08/11/2016   Coagulase negative Staphylococcus bacteremia 06/11/2016   ESRD (end stage renal disease) ([place]) 06/06/2016   Chronic cholecystitis 12/09/2015   Protein-calorie malnutrition, severe 11/29/2015   Respiratory arrest ([place]) 11/28/2015   Recurrent cellulitis of lower leg 11/13/2015   Chronic diastolic CHF (congestive heart failure) ([place]) 11/13/2015  Chronic respiratory failure with hypoxia ([place]) 11/13/2015   ESRD on dialysis ([place]) 11/13/2015   Anemia of chronic disease 11/13/2015   Rotator cuff syndrome of right shoulder 05/30/2013   Pain in joint, shoulder region 05/30/2013   Obesity hypoventilation syndrome ([place]) 09/04/2012   Leukopenia 11/11/2011   HTN (hypertension) 11/11/2011   HLD (hyperlipidemia) 11/11/2011   Transaminitis 11/11/2011   Diabetes mellitus ([place]) 10/27/2011   Morbid obesity ([place]) 10/27/2011   Lymphedema of lower extremity 10/04/2011    Past Surgical History:  Procedure Laterality Date    .Hemodialysis catheter Right 11/28/2015   A/V FISTULAGRAM N/A 07/26/2017   Procedure: A/V Fistulagram - Left Arm;  Surgeon: Serafina Mitchell, MD;  Location: [facility];  Service: Cardiovascular;  Laterality: N/A;   ABDOMINAL HYSTERECTOMY     ADENOIDECTOMY     AV FISTULA PLACEMENT Left 03/15/2016   Procedure: CREATION OF LEFT ARM ARTERIOVENOUS (AV) FISTULA  ;  Surgeon: Rosetta Posner, MD;  Location: [place];  Service: Vascular;  Laterality: Left;   CAPD INSERTION N/A 08/30/2017   Procedure: LAPAROSCOPIC INSERTION CONTINUOUS AMBULATORY PERITONEAL DIALYSIS  (CAPD) CATHETER;  Surgeon: Clovis Riley, MD;  Location: [place];  Service: General;  Laterality: N/A;   CESAREAN SECTION      x 2   CRANIECTOMY SUBOCCIPITAL W/ CERVICAL LAMINECTOMY / CHIARI     FISTULA SUPERFICIALIZATION Left 05/10/2016   Procedure: Left Arm FISTULA SUPERFICIALIZATION;  Surgeon: Rosetta Posner, MD;  Location: [facility];  Service: Vascular;  Laterality: Left;   IR GENERIC HISTORICAL  08/14/2016   IR REMOVAL TUN ACCESS W/ PORT W/O FL MOD SED 08/14/2016 Arne [place], MD [facility]   IR SINUS/FIST TUBE CHK-NON GI  01/01/2019   IR US GUIDE BX ASP/DRAIN  12/28/2018   MINOR REMOVAL OF PERITONEAL DIALYSIS CATHETER N/A 04/26/2019   Procedure: REMOVAL OF INFECTED PERITONEAL DIALYSIS CATHETER;  Surgeon: Erroll Luna, MD;  Location: [place] [place];  Service: General;  Laterality: N/A;   PERIPHERAL VASCULAR CATHETERIZATION Left 11/25/2016   Procedure: A/V Fistulagram;  Surgeon: Conrad Riley, MD;  Location: [facility];  Service: Cardiovascular;  Laterality: Left;  arm   PORTACATH PLACEMENT  07/05/2012   Procedure: INSERTION PORT-A-CATH;  Surgeon: Donato Heinz, MD;  Location: [facility];  Service: General;  Laterality: Left;  subclavian   portacath removal     RIGHT HEART CATH N/A 08/04/2017   Procedure: RIGHT HEART CATH;  Surgeon: Jolaine Artist, MD;  Location: [place] [facility];  Service: Cardiovascular;  Laterality: N/A;     THYROIDECTOMY, PARTIAL     TONSILLECTOMY       OB History    Gravida  4   Para  3   Term  2   Preterm  1   AB  1   Living        SAB  1   TAB      Ectopic      Multiple      Live Births               Home Medications    Prior to Admission medications   Medication Sig Start Date End Date Taking? Authorizing Provider  acetaminophen (TYLENOL) 325 MG tablet Take 650 mg by mouth every 6 (six) hours as needed for moderate pain or fever.     [provider]  albuterol (PROVENTIL) (2.5 MG/3ML) 0.083% nebulizer solution Take 2.5 mg by nebulization every 6 (six) hours as needed for  wheezing or shortness of breath.    [provider]  ALPRAZolam Duanne Moron) 0.5 MG tablet Take 0.5 mg by mouth 3 (three) times daily as needed for anxiety or sleep.     [provider]  blood glucose meter kit and supplies KIT Dispense based on patient and insurance preference. Use up to four times daily as directed. (FOR ICD-9 250.00, 250.01). 06/30/18   Oswald Hillock, MD  calcitRIOL (ROCALTROL) 0.5 MCG capsule Take 0.5 mcg by mouth daily. 10/19/18   [provider]  diphenhydrAMINE (BENADRYL) 25 mg capsule Take 25 mg by mouth every 6 (six) hours as needed for allergies.    [provider]  insulin detemir (LEVEMIR) 100 UNIT/ML injection Inject 0.03 mLs (3 Units total) into the skin daily. 04/30/19   Rai, Ripudeep K, MD  lactulose (CHRONULAC) 10 GM/15ML solution Take 45 mLs (30 g total) by mouth 2 (two) times daily. For constipation 04/30/19   Rai, Vernelle Emerald, MD  lanthanum (FOSRENOL) 500 MG chewable tablet Chew 1,000-1,500 mg by mouth 5 (five) times daily. Patient takes 3 tablets by mouth three times a day with meals and 2 tablets by mouth twice a day with snacks    [provider]  midodrine (PROAMATINE) 10 MG tablet Take 1 tablet (10 mg total) by mouth 2 (two) times daily with a meal. 04/30/19   Rai, Ripudeep K, MD  multivitamin (RENA-VIT) TABS  tablet Take 1 tablet by mouth daily. 05/30/18   [provider]  Oxycodone HCl 10 MG TABS Take 1 tablet (10 mg total) by mouth 4 (four) times daily as needed (pain). 02/01/17   [name], MD  senna (SENOKOT) 8.6 MG tablet Take 1 tablet by mouth daily.    [provider]  vancomycin IVPB Inject 1,000 mg into the vein every Monday, Wednesday, and Friday with hemodialysis for 14 days. Indication:  peritonitis Last Day of Therapy:  05/14/19 Labs - Sunday/Monday:  CBC/D, BMP, and vancomycin trough. Labs - Thursday:  BMP and vancomycin trough Labs - Every other week:  ESR and CRP 04/30/19 05/14/19  Rai, Ripudeep K, MD  insulin lispro (HUMALOG) 100 UNIT/ML injection Inject 60 Units into the skin 2 (two) times daily.    03/08/12  [provider]    Family History Family History  Problem Relation Age of Onset   Colon cancer Mother 2   Stroke Mother 51   Coronary artery disease Mother     Social History Social History   Tobacco Use   Smoking status: Never Smoker   Smokeless tobacco: Never Used  Substance Use Topics   Alcohol use: No    Alcohol/week: 0.0 standard drinks   Drug use: No     Allergies   Contrast media [iodinated diagnostic agents]; Pneumococcal vaccines; and Vancomycin   Review of Systems Review of Systems  Constitutional: Positive for fever. Negative for chills and fatigue.  HENT: Negative for congestion and sore throat.   Eyes: Negative for visual disturbance.  Respiratory: Negative for cough and shortness of breath.   Cardiovascular: Negative for chest pain.  Gastrointestinal: Negative for abdominal pain, diarrhea, nausea and vomiting.  Musculoskeletal: Negative for back pain, myalgias and neck pain.  Skin: Positive for wound. Negative for rash.  Neurological: Negative for dizziness, weakness, light-headedness, numbness and headaches.  All other systems reviewed and are negative.    Physical Exam Updated Vital Signs BP  104/61 (BP Location: Right Arm)    Pulse 86    Temp (!) 100.6  F (38.1 C) (Oral)    Resp 18    Ht _0  (1.651 m)    Wt (!) 141.6 kg    SpO2 100%    BMI 51.95 kg/m   Physical Exam Vitals signs and nursing note reviewed.  Constitutional:      Appearance: Normal appearance. She is well-developed.  HENT:     Head: Normocephalic and atraumatic.     Nose: Nose normal.     Mouth/Throat:     Mouth: Mucous membranes are moist.  Eyes:     Extraocular Movements: Extraocular movements intact.     Pupils: Pupils are equal, round, and reactive to light.  Neck:     Musculoskeletal: Normal range of motion and neck supple. No neck rigidity or muscular tenderness.     Vascular: No carotid bruit.  Cardiovascular:     Rate and Rhythm: Normal rate and regular rhythm.     Heart sounds: No murmur. No friction rub. No gallop.   Pulmonary:     Effort: Pulmonary effort is normal. No respiratory distress.     Breath sounds: Normal breath sounds. No stridor. No wheezing, rhonchi or rales.  Chest:     Chest wall: No tenderness.  Abdominal:     General: Bowel sounds are normal.     Palpations: Abdomen is soft.     Tenderness: There is no abdominal tenderness. There is no guarding or rebound.     Comments: Surgical abdominal wounds are packed and appeared to be well-healing.  There is no purulent drainage.  No surrounding erythema.  Abdomen is soft and nontender.  Musculoskeletal: Normal range of motion.        General: No tenderness.     Right lower leg: Edema present.     Left lower leg: Edema present.     Comments: Chronic appearing bilateral lower extremity edema.  No calf tenderness.  Dialysis fistula in left upper extremity with palpable thrill.  Lymphadenopathy:     Cervical: No cervical adenopathy.  Skin:    General: Skin is warm and dry.     Findings: No erythema or rash.  Neurological:     General: No focal deficit present.     Mental Status: She is alert and oriented to person, place, and  time.  Psychiatric:        Behavior: Behavior normal.      ED Treatments / Results  Labs (all labs ordered are listed, but only abnormal results are displayed) Labs Reviewed  SARS CORONAVIRUS 2 (HOSPITAL ORDER, [facility])  CBC WITH DIFFERENTIAL/PLATELET  BASIC METABOLIC PANEL  URINALYSIS, ROUTINE W REFLEX MICROSCOPIC  LACTIC ACID, PLASMA  LACTIC ACID, PLASMA    EKG None  Radiology Dg Chest Portable 1 View  Result Date: 05/11/2019 CLINICAL DATA:  Fever EXAM: PORTABLE CHEST 1 VIEW COMPARISON:  12/21/2018 FINDINGS: Cardiomegaly and aortic tortuosity. Low volume chest with haziness primarily attributed to soft tissue attenuation, also seen previously. There is likely vascular congestion. No edema, effusion, or pneumothorax. IMPRESSION: Cardiomegaly and vascular congestion which was also seen on prior. No focal pneumonia. Electronically Signed   By: Monte Fantasia M.D.   On: 05/11/2019 07:44    Procedures Procedures (including critical care time)  Medications Ordered in ED Medications - No data to display   Initial Impression / Assessment and Plan / ED Course  I have reviewed the triage vital signs and the nursing notes.  Pertinent labs & imaging results that were available during  my care of the patient were reviewed by me and considered in my medical decision making (see chart for details).       Abdomen is soft and nontender.  COVID-19 is negative.  Patient continues to receive IV vancomycin for peritonitis.  She receives this at the dialysis center.  She does have evidence of mild fluid overload.  Reached out to the dialysis center and will arrange for her to have dialysis tomorrow and receive her IV antibiotics.  Strict return precautions given.   Final Clinical Impressions(s) / ED Diagnoses   Final diagnoses:  None    ED Discharge Orders    None       Julianne Rice, MD 05/11/19 [phone]

## 2019-05-11 NOTE — Discharge Instructions (Addendum)
Your coronavirus testing is negative.  We have contacted your dialysis center and they will arrange to have you dialyzed tomorrow.  Return immediately for any worsening of your symptoms or concerns.

## 2019-05-17 DIAGNOSIS — T8571XA Infection and inflammatory reaction due to peritoneal dialysis catheter, initial encounter: Secondary | ICD-10-CM | POA: Diagnosis not present

## 2019-05-17 DIAGNOSIS — Z992 Dependence on renal dialysis: Secondary | ICD-10-CM | POA: Diagnosis not present

## 2019-05-17 DIAGNOSIS — I110 Hypertensive heart disease with heart failure: Secondary | ICD-10-CM | POA: Diagnosis not present

## 2019-05-17 DIAGNOSIS — B957 Other staphylococcus as the cause of diseases classified elsewhere: Secondary | ICD-10-CM | POA: Diagnosis not present

## 2019-05-17 DIAGNOSIS — E1122 Type 2 diabetes mellitus with diabetic chronic kidney disease: Secondary | ICD-10-CM | POA: Diagnosis not present

## 2019-05-17 DIAGNOSIS — N186 End stage renal disease: Secondary | ICD-10-CM | POA: Diagnosis not present

## 2019-05-22 DIAGNOSIS — B957 Other staphylococcus as the cause of diseases classified elsewhere: Secondary | ICD-10-CM | POA: Diagnosis not present

## 2019-05-22 DIAGNOSIS — N186 End stage renal disease: Secondary | ICD-10-CM | POA: Diagnosis not present

## 2019-05-22 DIAGNOSIS — T8571XA Infection and inflammatory reaction due to peritoneal dialysis catheter, initial encounter: Secondary | ICD-10-CM | POA: Diagnosis not present

## 2019-05-22 DIAGNOSIS — Z992 Dependence on renal dialysis: Secondary | ICD-10-CM | POA: Diagnosis not present

## 2019-05-22 DIAGNOSIS — I110 Hypertensive heart disease with heart failure: Secondary | ICD-10-CM | POA: Diagnosis not present

## 2019-05-22 DIAGNOSIS — E1122 Type 2 diabetes mellitus with diabetic chronic kidney disease: Secondary | ICD-10-CM | POA: Diagnosis not present

## 2019-05-31 DIAGNOSIS — I110 Hypertensive heart disease with heart failure: Secondary | ICD-10-CM | POA: Diagnosis not present

## 2019-05-31 DIAGNOSIS — N186 End stage renal disease: Secondary | ICD-10-CM | POA: Diagnosis not present

## 2019-05-31 DIAGNOSIS — Z992 Dependence on renal dialysis: Secondary | ICD-10-CM | POA: Diagnosis not present

## 2019-05-31 DIAGNOSIS — E1122 Type 2 diabetes mellitus with diabetic chronic kidney disease: Secondary | ICD-10-CM | POA: Diagnosis not present

## 2019-05-31 DIAGNOSIS — T8571XA Infection and inflammatory reaction due to peritoneal dialysis catheter, initial encounter: Secondary | ICD-10-CM | POA: Diagnosis not present

## 2019-05-31 DIAGNOSIS — B957 Other staphylococcus as the cause of diseases classified elsewhere: Secondary | ICD-10-CM | POA: Diagnosis not present

## 2019-05-31 DIAGNOSIS — I5032 Chronic diastolic (congestive) heart failure: Secondary | ICD-10-CM | POA: Diagnosis not present

## 2019-06-07 DIAGNOSIS — E1122 Type 2 diabetes mellitus with diabetic chronic kidney disease: Secondary | ICD-10-CM | POA: Diagnosis not present

## 2019-06-07 DIAGNOSIS — B957 Other staphylococcus as the cause of diseases classified elsewhere: Secondary | ICD-10-CM | POA: Diagnosis not present

## 2019-06-07 DIAGNOSIS — T8571XA Infection and inflammatory reaction due to peritoneal dialysis catheter, initial encounter: Secondary | ICD-10-CM | POA: Diagnosis not present

## 2019-06-07 DIAGNOSIS — I110 Hypertensive heart disease with heart failure: Secondary | ICD-10-CM | POA: Diagnosis not present

## 2019-06-07 DIAGNOSIS — N186 End stage renal disease: Secondary | ICD-10-CM | POA: Diagnosis not present

## 2019-06-07 DIAGNOSIS — Z992 Dependence on renal dialysis: Secondary | ICD-10-CM | POA: Diagnosis not present

## 2019-06-14 DIAGNOSIS — N186 End stage renal disease: Secondary | ICD-10-CM | POA: Diagnosis not present

## 2019-06-14 DIAGNOSIS — E1122 Type 2 diabetes mellitus with diabetic chronic kidney disease: Secondary | ICD-10-CM | POA: Diagnosis not present

## 2019-06-14 DIAGNOSIS — Z992 Dependence on renal dialysis: Secondary | ICD-10-CM | POA: Diagnosis not present

## 2019-06-14 DIAGNOSIS — I110 Hypertensive heart disease with heart failure: Secondary | ICD-10-CM | POA: Diagnosis not present

## 2019-06-14 DIAGNOSIS — B957 Other staphylococcus as the cause of diseases classified elsewhere: Secondary | ICD-10-CM | POA: Diagnosis not present

## 2019-06-14 DIAGNOSIS — T8571XA Infection and inflammatory reaction due to peritoneal dialysis catheter, initial encounter: Secondary | ICD-10-CM | POA: Diagnosis not present

## 2019-06-19 DIAGNOSIS — N186 End stage renal disease: Secondary | ICD-10-CM | POA: Diagnosis not present

## 2019-06-19 DIAGNOSIS — B957 Other staphylococcus as the cause of diseases classified elsewhere: Secondary | ICD-10-CM | POA: Diagnosis not present

## 2019-06-19 DIAGNOSIS — I110 Hypertensive heart disease with heart failure: Secondary | ICD-10-CM | POA: Diagnosis not present

## 2019-06-19 DIAGNOSIS — T8571XA Infection and inflammatory reaction due to peritoneal dialysis catheter, initial encounter: Secondary | ICD-10-CM | POA: Diagnosis not present

## 2019-06-19 DIAGNOSIS — E1122 Type 2 diabetes mellitus with diabetic chronic kidney disease: Secondary | ICD-10-CM | POA: Diagnosis not present

## 2019-06-19 DIAGNOSIS — Z992 Dependence on renal dialysis: Secondary | ICD-10-CM | POA: Diagnosis not present

## 2019-06-26 DIAGNOSIS — N186 End stage renal disease: Secondary | ICD-10-CM | POA: Diagnosis not present

## 2019-06-26 DIAGNOSIS — I110 Hypertensive heart disease with heart failure: Secondary | ICD-10-CM | POA: Diagnosis not present

## 2019-06-26 DIAGNOSIS — B957 Other staphylococcus as the cause of diseases classified elsewhere: Secondary | ICD-10-CM | POA: Diagnosis not present

## 2019-06-26 DIAGNOSIS — T8571XA Infection and inflammatory reaction due to peritoneal dialysis catheter, initial encounter: Secondary | ICD-10-CM | POA: Diagnosis not present

## 2019-06-26 DIAGNOSIS — E1122 Type 2 diabetes mellitus with diabetic chronic kidney disease: Secondary | ICD-10-CM | POA: Diagnosis not present

## 2019-06-26 DIAGNOSIS — Z992 Dependence on renal dialysis: Secondary | ICD-10-CM | POA: Diagnosis not present

## 2019-10-17 ENCOUNTER — Encounter: Payer: Self-pay | Admitting: *Deleted

## 2019-10-17 ENCOUNTER — Other Ambulatory Visit (HOSPITAL_COMMUNITY)
Admission: RE | Admit: 2019-10-17 | Discharge: 2019-10-17 | Disposition: A | Discharge status: Home / Self Care | Payer: Medicaid Other | Source: Ambulatory Visit | Attending: Vascular Surgery | Admitting: Vascular Surgery

## 2019-10-17 ENCOUNTER — Other Ambulatory Visit: Payer: Self-pay

## 2019-10-17 ENCOUNTER — Other Ambulatory Visit: Payer: Self-pay | Admitting: *Deleted

## 2019-10-17 DIAGNOSIS — Z20828 Contact with and (suspected) exposure to other viral communicable diseases: Secondary | ICD-10-CM | POA: Insufficient documentation

## 2019-10-17 DIAGNOSIS — Z01812 Encounter for preprocedural laboratory examination: Secondary | ICD-10-CM | POA: Insufficient documentation

## 2019-10-17 LAB — NOVEL CORONAVIRUS, NAA: SARS-CoV-2, NAA: NEGATIVE

## 2019-10-17 LAB — SARS CORONAVIRUS 2 BY RT PCR (HOSPITAL ORDER, PERFORMED IN ~~LOC~~ HOSPITAL LAB): SARS Coronavirus 2: NEGATIVE

## 2019-10-17 NOTE — Progress Notes (Signed)
Instructed by Dr. Trula Slade to schedule patient for Fistulogram for 10/18/2019. Dr. Trula Slade received call from [place] [facility] [place] [phone]). Difficult sticks and unable to complete HD treatments. Urgent need for intervention.

## 2019-10-17 NOTE — Progress Notes (Signed)
Schedule for fistulogram for 10/18/2019 per Dr. Trula Slade. Dr. Trula Slade received  paged call from [place]. Patient having difficult sticks and unable to receive full treatment. Needs urgent evaluation. Patient called and instructed to go to [facility] at 1300 today for nasal swab. NPO past [facility] tonight. Instructed to be at [facility] admitting at 9:30 am on 10/18/2019 for fistulogram. Must have a driver and caregiver for discharge to home . Hold am medications until after this procedure. Patient is no longer on any insulin. Read back instructions. Verbalized understanding.

## 2019-10-18 ENCOUNTER — Ambulatory Visit (HOSPITAL_COMMUNITY)
Admission: RE | Admit: 2019-10-18 | Discharge: 2019-10-18 | Disposition: A | Discharge status: Home / Self Care | Payer: Medicaid Other | Attending: Vascular Surgery | Admitting: Vascular Surgery

## 2019-10-18 ENCOUNTER — Encounter (HOSPITAL_COMMUNITY)
Admission: RE | Disposition: A | Discharge status: Home / Self Care | Payer: Self-pay | Source: Home / Self Care | Attending: Vascular Surgery

## 2019-10-18 DIAGNOSIS — I2510 Atherosclerotic heart disease of native coronary artery without angina pectoris: Secondary | ICD-10-CM | POA: Diagnosis not present

## 2019-10-18 DIAGNOSIS — M797 Fibromyalgia: Secondary | ICD-10-CM | POA: Diagnosis not present

## 2019-10-18 DIAGNOSIS — Z91041 Radiographic dye allergy status: Secondary | ICD-10-CM | POA: Diagnosis not present

## 2019-10-18 DIAGNOSIS — E7800 Pure hypercholesterolemia, unspecified: Secondary | ICD-10-CM | POA: Insufficient documentation

## 2019-10-18 DIAGNOSIS — I272 Pulmonary hypertension, unspecified: Secondary | ICD-10-CM | POA: Diagnosis not present

## 2019-10-18 DIAGNOSIS — Z8249 Family history of ischemic heart disease and other diseases of the circulatory system: Secondary | ICD-10-CM | POA: Diagnosis not present

## 2019-10-18 DIAGNOSIS — Y832 Surgical operation with anastomosis, bypass or graft as the cause of abnormal reaction of the patient, or of later complication, without mention of misadventure at the time of the procedure: Secondary | ICD-10-CM | POA: Insufficient documentation

## 2019-10-18 DIAGNOSIS — Z881 Allergy status to other antibiotic agents status: Secondary | ICD-10-CM | POA: Diagnosis not present

## 2019-10-18 DIAGNOSIS — T82858A Stenosis of vascular prosthetic devices, implants and grafts, initial encounter: Secondary | ICD-10-CM | POA: Insufficient documentation

## 2019-10-18 DIAGNOSIS — Z794 Long term (current) use of insulin: Secondary | ICD-10-CM | POA: Diagnosis not present

## 2019-10-18 DIAGNOSIS — Z79899 Other long term (current) drug therapy: Secondary | ICD-10-CM | POA: Diagnosis not present

## 2019-10-18 DIAGNOSIS — N186 End stage renal disease: Secondary | ICD-10-CM

## 2019-10-18 DIAGNOSIS — Z9981 Dependence on supplemental oxygen: Secondary | ICD-10-CM | POA: Diagnosis not present

## 2019-10-18 DIAGNOSIS — I509 Heart failure, unspecified: Secondary | ICD-10-CM | POA: Diagnosis not present

## 2019-10-18 DIAGNOSIS — Z887 Allergy status to serum and vaccine status: Secondary | ICD-10-CM | POA: Insufficient documentation

## 2019-10-18 DIAGNOSIS — I132 Hypertensive heart and chronic kidney disease with heart failure and with stage 5 chronic kidney disease, or end stage renal disease: Secondary | ICD-10-CM | POA: Diagnosis not present

## 2019-10-18 DIAGNOSIS — Z6841 Body Mass Index (BMI) 40.0 and over, adult: Secondary | ICD-10-CM | POA: Insufficient documentation

## 2019-10-18 DIAGNOSIS — J45909 Unspecified asthma, uncomplicated: Secondary | ICD-10-CM | POA: Insufficient documentation

## 2019-10-18 DIAGNOSIS — E1122 Type 2 diabetes mellitus with diabetic chronic kidney disease: Secondary | ICD-10-CM | POA: Insufficient documentation

## 2019-10-18 DIAGNOSIS — Z992 Dependence on renal dialysis: Secondary | ICD-10-CM

## 2019-10-18 DIAGNOSIS — E662 Morbid (severe) obesity with alveolar hypoventilation: Secondary | ICD-10-CM | POA: Insufficient documentation

## 2019-10-18 DIAGNOSIS — T82898A Other specified complication of vascular prosthetic devices, implants and grafts, initial encounter: Secondary | ICD-10-CM

## 2019-10-18 HISTORY — PX: PERIPHERAL VASCULAR BALLOON ANGIOPLASTY: CATH118281

## 2019-10-18 LAB — POCT I-STAT, CHEM 8
BUN: 64 mg/dL — ABNORMAL HIGH (ref 6–20)
Calcium, Ion: 1.15 mmol/L (ref 1.15–1.40)
Chloride: 101 mmol/L (ref 98–111)
Creatinine, Ser: 12.5 mg/dL — ABNORMAL HIGH (ref 0.44–1.00)
Glucose, Bld: 113 mg/dL — ABNORMAL HIGH (ref 70–99)
HCT: 36 % (ref 36.0–46.0)
Hemoglobin: 12.2 g/dL (ref 12.0–15.0)
Potassium: 4.8 mmol/L (ref 3.5–5.1)
Sodium: 138 mmol/L (ref 135–145)
TCO2: 26 mmol/L (ref 22–32)

## 2019-10-18 SURGERY — PERIPHERAL VASCULAR BALLOON ANGIOPLASTY
Anesthesia: LOCAL

## 2019-10-18 MED ORDER — LIDOCAINE HCL (PF) 1 % IJ SOLN
INTRAMUSCULAR | Status: AC
  Filled 2019-10-18: qty 30

## 2019-10-18 MED ORDER — IODIXANOL 320 MG/ML IV SOLN
INTRAVENOUS | Status: DC | PRN
  Administered 2019-10-18: 12:00:00 90 mL via INTRAVENOUS

## 2019-10-18 MED ORDER — HEPARIN (PORCINE) IN NACL 1000-0.9 UT/500ML-% IV SOLN
INTRAVENOUS | Status: DC | PRN
  Administered 2019-10-18: 500 mL

## 2019-10-18 MED ORDER — HEPARIN SODIUM (PORCINE) 1000 UNIT/ML IJ SOLN
INTRAMUSCULAR | Status: AC
  Filled 2019-10-18: qty 1

## 2019-10-18 MED ORDER — METHYLPREDNISOLONE SODIUM SUCC 125 MG IJ SOLR
125.0000 mg | INTRAMUSCULAR | Status: AC
  Administered 2019-10-18: 125 mg via INTRAVENOUS
  Filled 2019-10-18: qty 2

## 2019-10-18 MED ORDER — DIPHENHYDRAMINE HCL 50 MG/ML IJ SOLN
25.0000 mg | INTRAMUSCULAR | Status: AC
  Administered 2019-10-18: 25 mg via INTRAVENOUS
  Filled 2019-10-18: qty 1

## 2019-10-18 MED ORDER — SODIUM CHLORIDE 0.9% FLUSH: 3.0000 mL | INTRAVENOUS | Status: DC | PRN

## 2019-10-18 MED ORDER — LIDOCAINE HCL (PF) 1 % IJ SOLN
INTRAMUSCULAR | Status: DC | PRN
  Administered 2019-10-18: 3 mL via INTRADERMAL

## 2019-10-18 MED ORDER — HEPARIN SODIUM (PORCINE) 1000 UNIT/ML IJ SOLN
INTRAMUSCULAR | Status: DC | PRN
  Administered 2019-10-18: 3000 [IU] via INTRAVENOUS

## 2019-10-18 MED ORDER — HEPARIN (PORCINE) IN NACL 1000-0.9 UT/500ML-% IV SOLN
INTRAVENOUS | Status: AC
  Filled 2019-10-18: qty 500

## 2019-10-18 SURGICAL SUPPLY — 15 items
BALLN STERLING OTW 4X20X135 (BALLOONS) ×3
BALLN STERLING OTW 5X20X135 (BALLOONS) ×3
BALLOON STERLING OTW 4X20X135 (BALLOONS) IMPLANT
BALLOON STERLING OTW 5X20X135 (BALLOONS) IMPLANT
COVER DOME SNAP 22 D (MISCELLANEOUS) ×3 IMPLANT
KIT ENCORE 26 ADVANTAGE (KITS) ×1 IMPLANT
KIT MICROPUNCTURE NIT STIFF (SHEATH) ×1 IMPLANT
PROTECTION STATION PRESSURIZED (MISCELLANEOUS) ×3
SHEATH PINNACLE R/O II 5F 6CM (SHEATH) ×1 IMPLANT
SHEATH PROBE COVER 6X72 (BAG) ×3 IMPLANT
STATION PROTECTION PRESSURIZED (MISCELLANEOUS) ×2 IMPLANT
STOPCOCK MORSE 400PSI 3WAY (MISCELLANEOUS) ×3 IMPLANT
TRAY PV CATH (CUSTOM PROCEDURE TRAY) ×3 IMPLANT
TUBING CIL FLEX 10 FLL-RA (TUBING) ×3 IMPLANT
WIRE G V18X300CM (WIRE) ×1 IMPLANT

## 2019-10-18 NOTE — Discharge Instructions (Signed)

## 2019-10-18 NOTE — H&P (Signed)
H&P   History of Present Illness: This is a 53 y.o. female with multiple medical problems including end-stage renal disease currently dialyzes on Monday Wednesday Friday that presents for malfunction of her left radiocephalic AV fistula.  Patient states she has had the fistula for 3 years.  She has never had any issues with her fistula before.  She went to dialysis yesterday and the machine was reading high pressures and she could not get dialysis treatment.  She can still feel a thrill in her fistula.   Last fistulogram was 07/26/17 in our system with no central or peripheral stenosis.  Past Medical History:  Diagnosis Date  . Anxiety   . Asthma    as a child  . Cancer ([place])    thyroid  . Cellulitis   . CHF (congestive heart failure) ([place])   . Chiari malformation    s/p surgery  . Chiari malformation   . Chronic pain   . Complication of anesthesia 11/28/15   Resp arrest after  conscious  sedation  . Constipation   . Coronary artery disease    40-50% mid LAD 04/29/09, Medical tx. (Dr. Gwenlyn Found)  . Diabetes mellitus    Type II- reports being off all medication d/t it being controlled  . Fibromyalgia   . History of blood transfusion    hemorrage duinrg pregancy  . Hx of echocardiogram 10/2011   EF 55-60%  . Hypercholesterolemia   . Hypertension   . Lymph edema   . Obesity hypoventilation syndrome ([place])   . On home oxygen therapy    "2L; 24/7" (12/21/2018)  . Pneumonia    in past  . Pulmonary hypertension ([place])   . Renal disorder    M/W/F [facility] Pt started dialysis in Dec.2016  . S/P colonoscopy 05/26/2007   Dr. Laural Golden sigmoid diverticulosis random biopsies benign  . S/P endoscopy 05/01/2009   Dr. Penelope Coop pill-induced esophageal ulcerations distal to midesophagus, 2 small ulcers in the antrum of the stomach  . Shortness of breath dyspnea    with any exertion or if heart rate  is irregular while on dialysis  . Sleep apnea    reports that she no longer needs CPAP due to  weight loss    Past Surgical History:  Procedure Laterality Date  . Marland KitchenHemodialysis catheter Right 11/28/2015  . A/V FISTULAGRAM N/A 07/26/2017   Procedure: A/V Fistulagram - Left Arm;  Surgeon: Serafina Mitchell, MD;  Location: [facility];  Service: Cardiovascular;  Laterality: N/A;  . ABDOMINAL HYSTERECTOMY    . ADENOIDECTOMY    . AV FISTULA PLACEMENT Left 03/15/2016   Procedure: CREATION OF LEFT ARM ARTERIOVENOUS (AV) FISTULA  ;  Surgeon: Rosetta Posner, MD;  Location: [facility];  Service: Vascular;  Laterality: Left;  . CAPD INSERTION N/A 08/30/2017   Procedure: LAPAROSCOPIC INSERTION CONTINUOUS AMBULATORY PERITONEAL DIALYSIS  (CAPD) CATHETER;  Surgeon: Clovis Riley, MD;  Location: [facility];  Service: General;  Laterality: N/A;  . CESAREAN SECTION      x 2  . CRANIECTOMY SUBOCCIPITAL W/ CERVICAL LAMINECTOMY / CHIARI    . FISTULA SUPERFICIALIZATION Left 05/10/2016   Procedure: Left Arm FISTULA SUPERFICIALIZATION;  Surgeon: Rosetta Posner, MD;  Location: [facility];  Service: Vascular;  Laterality: Left;  . IR GENERIC HISTORICAL  08/14/2016   IR REMOVAL TUN ACCESS W/ PORT W/O FL MOD SED 08/14/2016 Arne Cleveland, MD [facility]  . IR SINUS/FIST TUBE CHK-NON GI  01/01/2019  . IR US GUIDE  BX ASP/DRAIN  12/28/2018  . MINOR REMOVAL OF PERITONEAL DIALYSIS CATHETER N/A 04/26/2019   Procedure: REMOVAL OF INFECTED PERITONEAL DIALYSIS CATHETER;  Surgeon: Erroll Luna, MD;  Location: [facility];  Service: General;  Laterality: N/A;  . PERIPHERAL VASCULAR CATHETERIZATION Left 11/25/2016   Procedure: A/V Fistulagram;  Surgeon: Conrad Pastura, MD;  Location: [facility];  Service: Cardiovascular;  Laterality: Left;  arm  . PORTACATH PLACEMENT  07/05/2012   Procedure: INSERTION PORT-A-CATH;  Surgeon: Donato Heinz, MD;  Location: [facility];  Service: General;  Laterality: Left;  subclavian  . portacath removal    . RIGHT HEART CATH N/A 08/04/2017   Procedure: RIGHT HEART CATH;  Surgeon: Jolaine Artist, MD;   Location: [facility];  Service: Cardiovascular;  Laterality: N/A;  . THYROIDECTOMY, PARTIAL    . TONSILLECTOMY      Allergies  Allergen Reactions  . Contrast Media [Iodinated Diagnostic Agents] Anaphylaxis, Hives, Swelling and Other (See Comments)    Dye for cardiac cath. Tongue swells  . Pneumococcal Vaccines Swelling and Other (See Comments)    Turns skin black, and bodily swelling  . Vancomycin Nausea And Vomiting and Other (See Comments)    Infusion "made me feel like I was dying" had to be readmitted to hospital    Prior to Admission medications   Medication Sig Start Date End Date Taking? Authorizing Provider  lanthanum (FOSRENOL) 500 MG chewable tablet Chew 1,000-1,500 mg by mouth 5 (five) times daily. 1500 mg by mouth three times a day with meals and 1000 mg by mouth twice a day with snacks   Yes [provider]  Melatonin 5 MG CAPS Take 5 mg by mouth at bedtime as needed (sleep).   Yes [provider]  multivitamin (RENA-VIT) TABS tablet Take 1 tablet by mouth daily. 05/30/18  Yes [provider]  Oxycodone HCl 10 MG TABS Take 1 tablet (10 mg total) by mouth 4 (four) times daily as needed (pain). 02/01/17  Yes [name], MD  sertraline (ZOLOFT) 50 MG tablet Take 50 mg by mouth daily. 04/16/19  Yes [provider]  acetaminophen (TYLENOL) 500 MG tablet Take 1,000 mg by mouth every 6 (six) hours as needed for moderate pain or headache.    [provider]  albuterol (PROVENTIL) (2.5 MG/3ML) 0.083% nebulizer solution Take 2.5 mg by nebulization every 6 (six) hours as needed for wheezing or shortness of breath.    [provider]  diphenhydrAMINE (BENADRYL) 25 mg capsule Take 25 mg by mouth daily as needed for allergies.     [provider]  insulin detemir (LEVEMIR) 100 UNIT/ML injection Inject 0.03 mLs (3 Units total) into the skin daily. Patient not taking: Reported on 10/17/2019 04/30/19   Rai, Vernelle Emerald, MD   lactulose (CHRONULAC) 10 GM/15ML solution Take 45 mLs (30 g total) by mouth 2 (two) times daily. For constipation Patient not taking: Reported on 10/17/2019 04/30/19   Rai, Vernelle Emerald, MD  loperamide (IMODIUM) 2 MG capsule Take 2-4 mg by mouth as needed for diarrhea or loose stools.     [provider]  midodrine (PROAMATINE) 10 MG tablet Take 1 tablet (10 mg total) by mouth 2 (two) times daily with a meal. Patient not taking: Reported on 10/17/2019 04/30/19   Rai, Ripudeep K, MD  insulin lispro (HUMALOG) 100 UNIT/ML injection Inject 60 Units into the skin 2 (two) times daily.    03/08/12  [provider]    Social  History   Socioeconomic History  . Marital status: Married    Spouse name: Not on file  . Number of children: 3  . Years of education: Not on file  . Highest education level: Not on file  Occupational History  . Not on file  Social Needs  . Financial resource strain: Not on file  . Food insecurity    Worry: Not on file    Inability: Not on file  . Transportation needs    Medical: Not on file    Non-medical: Not on file  Tobacco Use  . Smoking status: Never Smoker  . Smokeless tobacco: Never Used  Substance and Sexual Activity  . Alcohol use: No    Alcohol/week: 0.0 standard drinks  . Drug use: No  . Sexual activity: Not Currently  Lifestyle  . Physical activity    Days per week: Not on file    Minutes per session: Not on file  . Stress: Not on file  Relationships  . Social Herbalist on phone: Not on file    Gets together: Not on file    Attends religious service: Not on file    Active member of club or organization: Not on file    Attends meetings of clubs or organizations: Not on file    Relationship status: Not on file  . Intimate partner violence    Fear of current or ex partner: Not on file    Emotionally abused: Not on file    Physically abused: Not on file    Forced sexual activity: Not on file  Other Topics Concern  .  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 Colon cancer Mother 59  . Stroke Mother 77  . Coronary artery disease Mother     ROS: _0  Positive   _1  Negative   _2  All sytems reviewed and are negative  Cardiovascular: _3  chest pain/pressure _4  palpitations _5  SOB lying flat _6  DOE _7  pain in legs while walking _8  pain in legs at rest _9  pain in legs at night _10  non-healing ulcers _11  hx of DVT _12  swelling in legs  Pulmonary: _13  productive cough _14  asthma/wheezing _15  home O2  Neurologic: _16  weakness in _17  arms _18  legs _19  numbness in _20  arms _21  legs _22  hx of CVA _23  mini stroke _24 difficulty speaking or slurred speech _25  temporary loss of vision in one eye _26  dizziness  Hematologic: _27  hx of cancer _28  bleeding problems _29  problems with blood clotting easily  Endocrine:   _30  diabetes _31  thyroid disease  GI _32  vomiting blood _33  blood in stool  GU: _34  CKD/renal failure _35  HD--_36  M/W/F or _37  T/T/S _38  burning with urination _39  blood in urine  Psychiatric: _40  anxiety _41  depression  Musculoskeletal: _42  arthritis _43  joint pain  Integumentary: _44  rashes _45  ulcers  Constitutional: _46  fever _47  chills   Physical Examination  Vitals:   10/18/19 0957  BP: 139/80  Pulse: 66  Resp: 15  Temp: (!) 97.2 F (36.2 C)  SpO2: 100%   Body mass index is 49.16 kg/m.  General:  WDWN in NAD Gait: Not observed HENT: WNL, normocephalic Pulmonary: normal non-labored breathing, without Rales, rhonchi,  wheezing Cardiac: regular, without  Murmurs, rubs or gallops Abdomen: soft, NT/ND, no masses Vascular Exam/Pulses: Left arm radiocephalic fistula.  This has a thrill at the wrist but is more  pulsatile toward the antecubitum Extremities: without ischemic changes, without Gangrene  Musculoskeletal: no muscle wasting or atrophy  Neurologic: A&O X 3; Appropriate Affect ; SENSATION:  normal; MOTOR FUNCTION:  moving all extremities equally. Speech is fluent/normal   CBC    Component Value Date/Time   WBC 3.7 (L) 05/11/2019 0801   RBC 2.86 (L) 05/11/2019 0801   HGB 12.2 10/18/2019 1014   HCT 36.0 10/18/2019 1014   PLT 201 05/11/2019 0801   MCV 100.0 05/11/2019 0801   MCH 30.8 05/11/2019 0801   MCHC 30.8 05/11/2019 0801   RDW 14.6 05/11/2019 0801   LYMPHSABS 0.9 05/11/2019 0801   MONOABS 0.3 05/11/2019 0801   EOSABS 0.0 05/11/2019 0801   BASOSABS 0.0 05/11/2019 0801    BMET    Component Value Date/Time   NA 138 10/18/2019 1014   K 4.8 10/18/2019 1014   CL 101 10/18/2019 1014   CO2 25 05/11/2019 0801   GLUCOSE 113 (H) 10/18/2019 1014   BUN 64 (H) 10/18/2019 1014   CREATININE 12.50 (H) 10/18/2019 1014   CALCIUM 7.7 (L) 05/11/2019 0801   CALCIUM 8.5 09/06/2012 1056   GFRNONAA 4 (L) 05/11/2019 0801   GFRAA 4 (L) 05/11/2019 0801    COAGS: Lab Results  Component Value Date   INR 0.99 08/30/2017   INR 0.95 07/26/2017   INR 1.68 02/01/2017     Non-Invasive Vascular Imaging:    None  ASSESSMENT/PLAN: This is a  53 y.o. female with multiple medical problems including end-stage renal disease currently dialyzes on Monday Wednesday Friday that presents for malfunction of her left radiocephalic AV fistula.  Plan left arm fistulogram with possible intervention after risk and benefits were discussed.  Also discussed if I cannot get her fistula working well tentatively would place tunnel dialysis catheter.     Marty Heck, MD [facility] Office: [phone] Pager: [phone]

## 2019-10-18 NOTE — Op Note (Addendum)
OPERATIVE NOTE   PROCEDURE: 1. left radiocephalic arteriovenous fistula cannulation under ultrasound guidance 2. left arm fistulogram including central venogram 3. left cephalic vein peripheral angioplasty (4 mm x 20 mm Sterling and 5 mm x 20 mm Sterling)  PRE-OPERATIVE DIAGNOSIS: Malfunctioning left arteriovenous fistula  POST-OPERATIVE DIAGNOSIS: same as above   SURGEON: Marty Heck, MD  ANESTHESIA: local  ESTIMATED BLOOD LOSS: 5 cc  FINDING(S): 1. I am not certain why dialysis had issues accessing her fistula yesterday because she had a thrill at the wrist even before intervention today.  The left radiocephalic fistula is patent throughout the mid to upper forearm, upper arm, and no evidence of central venous stenosis.  The only pertinent finding was an approximate 50% stenosis in the cephalic vein just beyond the arterial anastomosis to the radial artery at the distal wrist.  Ultimately the fistula was accessed retrograde with a wire into the radial artery.  I angioplastied this lesion with a 4 mm and 5 mm Sterling.  Patient has an excellent thrill in the fistula still at completion.  SPECIMEN(S):  None  CONTRAST: 90 cc  INDICATIONS: [name] is a 53 y.o. female who  presents with malfunctioning left radiocephalic arteriovenous fistula.  The patient is scheduled for left arm fistulogram.  The patient is aware the risks include but are not limited to: bleeding, infection, thrombosis of the cannulated access, and possible anaphylactic reaction to the contrast.  The patient is aware of the risks of the procedure and elects to proceed forward.  DESCRIPTION: After full informed written consent was obtained, the patient was brought back to the angiography suite and placed supine upon the angiography table.  The patient was connected to monitoring equipment.  The left arm was prepped and draped in the standard fashion for a left arm fistulogram.  Under ultrasound  guidance, the fistula was evaluated, it was patent, an image was saved.  The left arteriovenous fistula was cannulated with a micropuncture needle under ultrasound.  The microwire was advanced into the fistula and the needle was exchanged for the a microsheath, which was lodged 2 cm into the access.  The wire was removed and the sheath was connected to the IV extension tubing.  Hand injections were completed to image the access from the wrist up to the level of axilla.  The central venous structures were also imaged by hand injections.  Based on the images I did not see any stenosis in the mid to proximal forearm antecubitum upper arm or centrally.  The only finding was approximate 50% stenosis of the cephalic vein in the distal wrist just beyond the arterial anastomosis.  I placed a 4-0 Monocryl pursestring and the micro sheath was removed.  We then prepped higher up on the forearm and I accessed the fistula retrograde with ultrasound guidance.  Ultimately threaded a wire down the cephalic vein across the arterial anastomosis and into the radial artery in the hand.  I used an 018 wire for this.  The lesion was marked with several retrograde injections with a blood pressure cuff on the upper arm.  Ultimately selected a 4 mm then 5 mm short Sterling balloon that was placed across this lesion and angioplastied to nominal pressure for 2 minutes each.  I was not comfortable using any larger balloon given that the radial artery was small enough to limit any larger balloon and the lesion was very close to the artery.  That point in time wires were removed.  Sheath was removed with a 4-0 Monocryl pursestring tied down.  She has an excellent thrill in the fistula still.  I am hopeful this will work for her.  COMPLICATIONS: None  CONDITION: Stable  Marty Heck, MD [facility] Office: [phone] Pager: [phone]  10/18/2019 12:14 PM

## 2019-10-18 NOTE — Progress Notes (Signed)
Discharge instructions reviewed with patient and family. Verbalized understanding. 

## 2019-10-19 ENCOUNTER — Encounter (HOSPITAL_COMMUNITY): Payer: Self-pay | Admitting: Vascular Surgery

## 2019-12-23 DIAGNOSIS — M797 Fibromyalgia: Secondary | ICD-10-CM

## 2019-12-23 DIAGNOSIS — E785 Hyperlipidemia, unspecified: Secondary | ICD-10-CM | POA: Insufficient documentation

## 2019-12-23 DIAGNOSIS — I10 Essential (primary) hypertension: Secondary | ICD-10-CM | POA: Insufficient documentation

## 2019-12-23 DIAGNOSIS — J449 Chronic obstructive pulmonary disease, unspecified: Secondary | ICD-10-CM | POA: Insufficient documentation

## 2019-12-23 DIAGNOSIS — N185 Chronic kidney disease, stage 5: Secondary | ICD-10-CM | POA: Insufficient documentation

## 2019-12-23 DIAGNOSIS — I120 Hypertensive chronic kidney disease with stage 5 chronic kidney disease or end stage renal disease: Secondary | ICD-10-CM

## 2019-12-23 DIAGNOSIS — K659 Peritonitis, unspecified: Secondary | ICD-10-CM

## 2019-12-23 DIAGNOSIS — I129 Hypertensive chronic kidney disease with stage 1 through stage 4 chronic kidney disease, or unspecified chronic kidney disease: Secondary | ICD-10-CM

## 2019-12-23 DIAGNOSIS — D649 Anemia, unspecified: Secondary | ICD-10-CM

## 2019-12-23 DIAGNOSIS — I890 Lymphedema, not elsewhere classified: Secondary | ICD-10-CM | POA: Insufficient documentation

## 2019-12-23 DIAGNOSIS — E1121 Type 2 diabetes mellitus with diabetic nephropathy: Secondary | ICD-10-CM | POA: Insufficient documentation

## 2019-12-23 DIAGNOSIS — E1165 Type 2 diabetes mellitus with hyperglycemia: Secondary | ICD-10-CM | POA: Insufficient documentation

## 2020-04-03 ENCOUNTER — Ambulatory Visit: Payer: Self-pay | Admitting: Family Medicine

## 2020-06-30 ENCOUNTER — Encounter (HOSPITAL_COMMUNITY): Payer: Self-pay | Admitting: Emergency Medicine

## 2020-06-30 ENCOUNTER — Emergency Department (HOSPITAL_COMMUNITY): Payer: Medicare Other

## 2020-06-30 ENCOUNTER — Other Ambulatory Visit: Payer: Self-pay

## 2020-06-30 ENCOUNTER — Inpatient Hospital Stay (HOSPITAL_COMMUNITY)
Admission: EM | Admit: 2020-06-30 | Discharge: 2020-07-04 | DRG: 871 | Disposition: A | Discharge status: Home / Self Care | Payer: Medicare Other | Attending: Family Medicine | Admitting: Family Medicine

## 2020-06-30 DIAGNOSIS — A040 Enteropathogenic Escherichia coli infection: Secondary | ICD-10-CM | POA: Diagnosis present

## 2020-06-30 DIAGNOSIS — R509 Fever, unspecified: Secondary | ICD-10-CM

## 2020-06-30 DIAGNOSIS — J449 Chronic obstructive pulmonary disease, unspecified: Secondary | ICD-10-CM | POA: Diagnosis present

## 2020-06-30 DIAGNOSIS — Z85850 Personal history of malignant neoplasm of thyroid: Secondary | ICD-10-CM

## 2020-06-30 DIAGNOSIS — Z823 Family history of stroke: Secondary | ICD-10-CM

## 2020-06-30 DIAGNOSIS — Z800 Family history of malignant neoplasm of digestive organs: Secondary | ICD-10-CM

## 2020-06-30 DIAGNOSIS — Z91041 Radiographic dye allergy status: Secondary | ICD-10-CM

## 2020-06-30 DIAGNOSIS — A419 Sepsis, unspecified organism: Secondary | ICD-10-CM | POA: Diagnosis present

## 2020-06-30 DIAGNOSIS — Z79899 Other long term (current) drug therapy: Secondary | ICD-10-CM

## 2020-06-30 DIAGNOSIS — R6510 Systemic inflammatory response syndrome (SIRS) of non-infectious origin without acute organ dysfunction: Secondary | ICD-10-CM

## 2020-06-30 DIAGNOSIS — F419 Anxiety disorder, unspecified: Secondary | ICD-10-CM | POA: Diagnosis present

## 2020-06-30 DIAGNOSIS — E872 Acidosis: Secondary | ICD-10-CM | POA: Diagnosis present

## 2020-06-30 DIAGNOSIS — E785 Hyperlipidemia, unspecified: Secondary | ICD-10-CM | POA: Diagnosis present

## 2020-06-30 DIAGNOSIS — L97121 Non-pressure chronic ulcer of left thigh limited to breakdown of skin: Secondary | ICD-10-CM | POA: Diagnosis present

## 2020-06-30 DIAGNOSIS — I2510 Atherosclerotic heart disease of native coronary artery without angina pectoris: Secondary | ICD-10-CM | POA: Diagnosis present

## 2020-06-30 DIAGNOSIS — D631 Anemia in chronic kidney disease: Secondary | ICD-10-CM | POA: Diagnosis present

## 2020-06-30 DIAGNOSIS — G8929 Other chronic pain: Secondary | ICD-10-CM | POA: Diagnosis present

## 2020-06-30 DIAGNOSIS — L0390 Cellulitis, unspecified: Secondary | ICD-10-CM | POA: Diagnosis present

## 2020-06-30 DIAGNOSIS — R197 Diarrhea, unspecified: Secondary | ICD-10-CM | POA: Diagnosis not present

## 2020-06-30 DIAGNOSIS — K5900 Constipation, unspecified: Secondary | ICD-10-CM | POA: Diagnosis present

## 2020-06-30 DIAGNOSIS — Z992 Dependence on renal dialysis: Secondary | ICD-10-CM

## 2020-06-30 DIAGNOSIS — E1122 Type 2 diabetes mellitus with diabetic chronic kidney disease: Secondary | ICD-10-CM | POA: Diagnosis present

## 2020-06-30 DIAGNOSIS — Z9981 Dependence on supplemental oxygen: Secondary | ICD-10-CM

## 2020-06-30 DIAGNOSIS — R6521 Severe sepsis with septic shock: Secondary | ICD-10-CM | POA: Diagnosis present

## 2020-06-30 DIAGNOSIS — Z881 Allergy status to other antibiotic agents status: Secondary | ICD-10-CM

## 2020-06-30 DIAGNOSIS — M797 Fibromyalgia: Secondary | ICD-10-CM | POA: Diagnosis present

## 2020-06-30 DIAGNOSIS — N186 End stage renal disease: Secondary | ICD-10-CM

## 2020-06-30 DIAGNOSIS — Z887 Allergy status to serum and vaccine status: Secondary | ICD-10-CM

## 2020-06-30 DIAGNOSIS — F329 Major depressive disorder, single episode, unspecified: Secondary | ICD-10-CM | POA: Diagnosis present

## 2020-06-30 DIAGNOSIS — R7881 Bacteremia: Secondary | ICD-10-CM | POA: Diagnosis present

## 2020-06-30 DIAGNOSIS — E860 Dehydration: Secondary | ICD-10-CM | POA: Diagnosis not present

## 2020-06-30 DIAGNOSIS — I132 Hypertensive heart and chronic kidney disease with heart failure and with stage 5 chronic kidney disease, or end stage renal disease: Secondary | ICD-10-CM | POA: Diagnosis present

## 2020-06-30 DIAGNOSIS — R630 Anorexia: Secondary | ICD-10-CM | POA: Diagnosis not present

## 2020-06-30 DIAGNOSIS — I5032 Chronic diastolic (congestive) heart failure: Secondary | ICD-10-CM | POA: Diagnosis present

## 2020-06-30 DIAGNOSIS — R000 Tachycardia, unspecified: Secondary | ICD-10-CM | POA: Diagnosis present

## 2020-06-30 DIAGNOSIS — Z8249 Family history of ischemic heart disease and other diseases of the circulatory system: Secondary | ICD-10-CM

## 2020-06-30 DIAGNOSIS — Z794 Long term (current) use of insulin: Secondary | ICD-10-CM

## 2020-06-30 DIAGNOSIS — I890 Lymphedema, not elsewhere classified: Secondary | ICD-10-CM

## 2020-06-30 DIAGNOSIS — E662 Morbid (severe) obesity with alveolar hypoventilation: Secondary | ICD-10-CM | POA: Diagnosis present

## 2020-06-30 DIAGNOSIS — Z6841 Body Mass Index (BMI) 40.0 and over, adult: Secondary | ICD-10-CM

## 2020-06-30 DIAGNOSIS — E1121 Type 2 diabetes mellitus with diabetic nephropathy: Secondary | ICD-10-CM | POA: Diagnosis present

## 2020-06-30 DIAGNOSIS — Z8701 Personal history of pneumonia (recurrent): Secondary | ICD-10-CM

## 2020-06-30 DIAGNOSIS — E66813 Obesity, class 3: Secondary | ICD-10-CM | POA: Diagnosis present

## 2020-06-30 DIAGNOSIS — E875 Hyperkalemia: Secondary | ICD-10-CM | POA: Diagnosis present

## 2020-06-30 DIAGNOSIS — E1165 Type 2 diabetes mellitus with hyperglycemia: Secondary | ICD-10-CM | POA: Diagnosis present

## 2020-06-30 DIAGNOSIS — I10 Essential (primary) hypertension: Secondary | ICD-10-CM | POA: Diagnosis present

## 2020-06-30 DIAGNOSIS — Z20822 Contact with and (suspected) exposure to covid-19: Secondary | ICD-10-CM | POA: Diagnosis present

## 2020-06-30 LAB — CBC WITH DIFFERENTIAL/PLATELET
Abs Immature Granulocytes: 0.1 10*3/uL — ABNORMAL HIGH (ref 0.00–0.07)
Basophils Absolute: 0 10*3/uL (ref 0.0–0.1)
Basophils Relative: 0 %
Eosinophils Absolute: 0 10*3/uL (ref 0.0–0.5)
Eosinophils Relative: 0 %
HCT: 37.6 % (ref 36.0–46.0)
Hemoglobin: 12.2 g/dL (ref 12.0–15.0)
Immature Granulocytes: 1 %
Lymphocytes Relative: 4 %
Lymphs Abs: 0.6 10*3/uL — ABNORMAL LOW (ref 0.7–4.0)
MCH: 32.2 pg (ref 26.0–34.0)
MCHC: 32.4 g/dL (ref 30.0–36.0)
MCV: 99.2 fL (ref 80.0–100.0)
Monocytes Absolute: 0.5 10*3/uL (ref 0.1–1.0)
Monocytes Relative: 3 %
Neutro Abs: 15.3 10*3/uL — ABNORMAL HIGH (ref 1.7–7.7)
Neutrophils Relative %: 92 %
Platelets: 209 10*3/uL (ref 150–400)
RBC: 3.79 MIL/uL — ABNORMAL LOW (ref 3.87–5.11)
RDW: 13.4 % (ref 11.5–15.5)
WBC: 16.6 10*3/uL — ABNORMAL HIGH (ref 4.0–10.5)
nRBC: 0 % (ref 0.0–0.2)

## 2020-06-30 LAB — COMPREHENSIVE METABOLIC PANEL
ALT: 13 U/L (ref 0–44)
AST: 16 U/L (ref 15–41)
Albumin: 4 g/dL (ref 3.5–5.0)
Alkaline Phosphatase: 270 U/L — ABNORMAL HIGH (ref 38–126)
Anion gap: 16 — ABNORMAL HIGH (ref 5–15)
BUN: 63 mg/dL — ABNORMAL HIGH (ref 6–20)
CO2: 22 mmol/L (ref 22–32)
Calcium: 9 mg/dL (ref 8.9–10.3)
Chloride: 96 mmol/L — ABNORMAL LOW (ref 98–111)
Creatinine, Ser: 11.11 mg/dL — ABNORMAL HIGH (ref 0.44–1.00)
GFR calc Af Amer: 4 mL/min — ABNORMAL LOW (ref >60)
GFR calc non Af Amer: 3 mL/min — ABNORMAL LOW (ref >60)
Glucose, Bld: 237 mg/dL — ABNORMAL HIGH (ref 70–99)
Potassium: 5.4 mmol/L — ABNORMAL HIGH (ref 3.5–5.1)
Sodium: 134 mmol/L — ABNORMAL LOW (ref 135–145)
Total Bilirubin: 0.5 mg/dL (ref 0.3–1.2)
Total Protein: 7.9 g/dL (ref 6.5–8.1)

## 2020-06-30 LAB — GLUCOSE, CAPILLARY
Glucose-Capillary: 189 mg/dL — ABNORMAL HIGH (ref 70–99)
Glucose-Capillary: 85 mg/dL (ref 70–99)
Glucose-Capillary: 117 mg/dL — ABNORMAL HIGH (ref 70–99)

## 2020-06-30 LAB — SARS CORONAVIRUS 2 BY RT PCR (HOSPITAL ORDER, PERFORMED IN ~~LOC~~ HOSPITAL LAB): SARS Coronavirus 2: NEGATIVE

## 2020-06-30 LAB — CBG MONITORING, ED: Glucose-Capillary: 230 mg/dL — ABNORMAL HIGH (ref 70–99)

## 2020-06-30 LAB — BRAIN NATRIURETIC PEPTIDE: B Natriuretic Peptide: 85 pg/mL (ref 0.0–100.0)

## 2020-06-30 LAB — LACTIC ACID, PLASMA
Lactic Acid, Venous: 2.3 mmol/L (ref 0.5–1.9)
Lactic Acid, Venous: 1.8 mmol/L (ref 0.5–1.9)

## 2020-06-30 LAB — PROTIME-INR
INR: 1.1 (ref 0.8–1.2)
Prothrombin Time: 14.2 seconds (ref 11.4–15.2)

## 2020-06-30 LAB — MRSA PCR SCREENING: MRSA by PCR: NEGATIVE

## 2020-06-30 LAB — PROCALCITONIN: Procalcitonin: 5.09 ng/mL

## 2020-06-30 LAB — FERRITIN: Ferritin: 865 ng/mL — ABNORMAL HIGH (ref 11–307)

## 2020-06-30 LAB — D-DIMER, QUANTITATIVE: D-Dimer, Quant: 0.95 ug/mL-FEU — ABNORMAL HIGH (ref 0.00–0.50)

## 2020-06-30 LAB — APTT: aPTT: 29 seconds (ref 24–36)

## 2020-06-30 LAB — LACTATE DEHYDROGENASE: LDH: 157 U/L (ref 98–192)

## 2020-06-30 LAB — FIBRINOGEN: Fibrinogen: 584 mg/dL — ABNORMAL HIGH (ref 210–475)

## 2020-06-30 LAB — C-REACTIVE PROTEIN: CRP: 6.2 mg/dL — ABNORMAL HIGH (ref <1.0)

## 2020-06-30 LAB — TRIGLYCERIDES: Triglycerides: 136 mg/dL (ref <150)

## 2020-06-30 MED ORDER — ACETAMINOPHEN 325 MG PO TABS
325.0000 mg | ORAL_TABLET | Freq: Four times a day (QID) | ORAL | Status: DC | PRN
  Administered 2020-06-30: 325 mg via ORAL

## 2020-06-30 MED ORDER — ACETAMINOPHEN 500 MG PO TABS
1000.0000 mg | ORAL_TABLET | Freq: Once | ORAL | Status: AC
  Administered 2020-06-30: 1000 mg via ORAL
  Filled 2020-06-30: qty 2

## 2020-06-30 MED ORDER — SODIUM CHLORIDE 0.9 % IV BOLUS (SEPSIS): 10.0000 mL | Freq: Once | INTRAVENOUS | Status: DC

## 2020-06-30 MED ORDER — ONDANSETRON HCL 4 MG PO TABS: 4.0000 mg | ORAL_TABLET | Freq: Four times a day (QID) | ORAL | Status: DC | PRN

## 2020-06-30 MED ORDER — SODIUM CHLORIDE 0.9 % IV SOLN
2.0000 g | INTRAVENOUS | Status: DC
  Administered 2020-06-30: 2 g via INTRAVENOUS
  Filled 2020-06-30: qty 2

## 2020-06-30 MED ORDER — LACTATED RINGERS IV BOLUS (SEPSIS)
1000.0000 mL | Freq: Once | INTRAVENOUS | Status: AC
  Administered 2020-06-30: 1000 mL via INTRAVENOUS

## 2020-06-30 MED ORDER — CLINDAMYCIN PHOSPHATE 900 MG/50ML IV SOLN
900.0000 mg | Freq: Once | INTRAVENOUS | Status: AC
  Administered 2020-06-30: 900 mg via INTRAVENOUS
  Filled 2020-06-30: qty 50

## 2020-06-30 MED ORDER — ACETAMINOPHEN 325 MG PO TABS
650.0000 mg | ORAL_TABLET | ORAL | Status: DC | PRN
  Administered 2020-06-30 – 2020-07-01 (×3): 650 mg via ORAL
  Filled 2020-06-30 (×4): qty 2

## 2020-06-30 MED ORDER — ONDANSETRON HCL 4 MG/2ML IJ SOLN
4.0000 mg | Freq: Four times a day (QID) | INTRAMUSCULAR | Status: DC | PRN
  Administered 2020-07-02 – 2020-07-04 (×5): 4 mg via INTRAVENOUS
  Filled 2020-06-30 (×5): qty 2

## 2020-06-30 MED ORDER — METRONIDAZOLE IN NACL 5-0.79 MG/ML-% IV SOLN
500.0000 mg | Freq: Three times a day (TID) | INTRAVENOUS | Status: DC
  Administered 2020-06-30 – 2020-07-02 (×6): 500 mg via INTRAVENOUS
  Filled 2020-06-30 (×7): qty 100

## 2020-06-30 MED ORDER — ACETAMINOPHEN 650 MG RE SUPP: 650.0000 mg | RECTAL | Status: DC | PRN

## 2020-06-30 MED ORDER — INSULIN ASPART 100 UNIT/ML ~~LOC~~ SOLN
0.0000 [IU] | Freq: Three times a day (TID) | SUBCUTANEOUS | Status: DC
  Administered 2020-06-30: 3 [IU] via SUBCUTANEOUS
  Administered 2020-06-30: 2 [IU] via SUBCUTANEOUS
  Administered 2020-07-01: 1 [IU] via SUBCUTANEOUS
  Administered 2020-07-01 – 2020-07-02 (×2): 2 [IU] via SUBCUTANEOUS
  Administered 2020-07-02 (×2): 1 [IU] via SUBCUTANEOUS
  Administered 2020-07-03: 2 [IU] via SUBCUTANEOUS
  Administered 2020-07-03: 1 [IU] via SUBCUTANEOUS
  Administered 2020-07-03: 2 [IU] via SUBCUTANEOUS
  Administered 2020-07-04 (×2): 1 [IU] via SUBCUTANEOUS
  Filled 2020-06-30: qty 1

## 2020-06-30 MED ORDER — SODIUM CHLORIDE 0.9 % IV SOLN
2.0000 g | Freq: Once | INTRAVENOUS | Status: AC
  Administered 2020-06-30: 2 g via INTRAVENOUS
  Filled 2020-06-30: qty 2

## 2020-06-30 MED ORDER — SODIUM CHLORIDE 0.9 % IV SOLN: 2.0000 g | Freq: Once | INTRAVENOUS | Status: DC

## 2020-06-30 MED ORDER — ENOXAPARIN SODIUM 40 MG/0.4ML ~~LOC~~ SOLN
40.0000 mg | SUBCUTANEOUS | Status: DC
  Administered 2020-06-30: 40 mg via SUBCUTANEOUS
  Filled 2020-06-30: qty 0.4

## 2020-06-30 MED ORDER — METRONIDAZOLE IN NACL 5-0.79 MG/ML-% IV SOLN
500.0000 mg | Freq: Once | INTRAVENOUS | Status: AC
  Administered 2020-06-30: 500 mg via INTRAVENOUS
  Filled 2020-06-30: qty 100

## 2020-06-30 MED ORDER — SODIUM ZIRCONIUM CYCLOSILICATE 5 G PO PACK
5.0000 g | PACK | Freq: Once | ORAL | Status: AC
  Administered 2020-06-30: 5 g via ORAL
  Filled 2020-06-30: qty 1

## 2020-06-30 MED ORDER — LACTATED RINGERS IV BOLUS (SEPSIS)
800.0000 mL | Freq: Once | INTRAVENOUS | Status: AC
  Administered 2020-06-30: 800 mL via INTRAVENOUS

## 2020-06-30 MED ORDER — ENOXAPARIN SODIUM 30 MG/0.3ML ~~LOC~~ SOLN
30.0000 mg | SUBCUTANEOUS | Status: DC
  Administered 2020-07-01 – 2020-07-04 (×4): 30 mg via SUBCUTANEOUS
  Filled 2020-06-30 (×4): qty 0.3

## 2020-06-30 NOTE — Progress Notes (Addendum)
   06/30/20 2134  Vitals  Temp (!) 101.2 F (38.4 C)  Temp Source Oral  BP (!) 111/53  MAP (mmHg) 71  BP Location Right Arm  BP Method Automatic  Patient Position (if appropriate) Lying  Pulse Rate 95  Pulse Rate Source Monitor  Oxygen Therapy  SpO2 99 %  O2 Device Room Air  MEWS Score  MEWS Temp 1  MEWS Systolic 0  MEWS Pulse 0  MEWS RR 0  MEWS LOC 0  MEWS Score 1  MEWS Score Color Green   Temp elevated at 101.2, last given Tylenol 650 mg at 1844. [facility]level notified. New orders placed. Pt given an additional dose of Tylenol 325 mg for fever.

## 2020-06-30 NOTE — ED Triage Notes (Signed)
Pt brought in by EMS for c/o fever. Pt's husband states fever was 103-104 at home and just started today.

## 2020-06-30 NOTE — Care Management Obs Status (Signed)
[facility] NOTIFICATION   Patient Details  Name: [name] MRN: [id number] Date of Birth: [birth year]   Medicare Observation Status Notification Given:  Yes    [name] 06/30/2020, 4:23 PM

## 2020-06-30 NOTE — Progress Notes (Signed)
Patient seen and evaluated, chart reviewed, please see EMR for updated orders. Please see full H&P dictated by admitting physician Dr Olevia Bowens for same date of service.     Tolerated hemodialysis well  --Continue cefepime and Flagyl pending cultures   Patient seen and evaluated, chart reviewed, please see EMR for updated orders. Please see full H&P dictated by admitting physician Dr Olevia Bowens for same date of service.

## 2020-06-30 NOTE — Consult Note (Signed)
[name] [name] Rexene Agent Requesting Physician:  Denton Brick MD  Reason for Consult:  ESRD comanagement HPI:  [id number] ESRD [facility] MWF LFA AVF admitted overnight after onset of myalgias, fatigue, fevers/chills starging 7/10.  Admitted for sepsis. Participated in large weekend block party and during event felt increasingly unwell.  HD on Friday was uneventful.   In ED was febrile and tachycardic, WBC up.  Placed on empiric ABX, broad spectrum.   Rec 1.8L IVF. CXR negative,  Cx pending  Pt states feeling somewhat improved at the current time.    Renal labs K 5.4, rec [place]    Other PMH includes prev being on PD, chronic BLE lymphedema, DM2, CAD, hx/o thyroid cancer   Creatinine, Ser (mg/dL)  Date Value  06/30/2020 11.11 (H)  10/18/2019 12.50 (H)  05/11/2019 10.76 (H)  04/30/2019 8.78 (H)  04/28/2019 5.17 (H)  04/27/2019 6.71 (H)  04/27/2019 6.44 (H)  04/26/2019 5.16 (H)  04/25/2019 7.84 (H)  04/24/2019 6.20 (H)  ]  ROS Balance of 12 systems is negative w/ exceptions as above  PMH  Past Medical History:  Diagnosis Date  . Anemia, unspecified   . Anxiety   . Asthma    as a child  . Cancer ([place])    thyroid  . Cellulitis   . CHF (congestive heart failure) ([place])   . Chiari malformation    s/p surgery  . Chiari malformation   . Chronic kidney disease, stage 5 ([place])   . Chronic obstructive pulmonary disease, unspecified ([place])   . Chronic pain   . Complication of anesthesia 11/28/15   Resp arrest after  conscious  sedation  . Compression of brain ([place])   . Constipation   . Coronary artery disease    40-50% mid LAD 04/29/09, Medical tx. (Dr. Gwenlyn Found)  . Diabetes mellitus    Type II- reports being off all medication d/t it being controlled  . Essential (primary) hypertension   . Fibromyalgia   . Fibromyalgia   . History of blood transfusion    hemorrage duinrg pregancy  . Hx of echocardiogram 10/2011   EF 55-60%  .  Hypercholesterolemia   . Hyperlipidemia, unspecified   . Hypertension   . Hypertensive chronic kidney disease with stage 1 through stage 4 chronic kidney disease, or unspecified chronic kidney disease   . Hypertensive chronic kidney disease with stage 5 chronic kidney disease or end stage renal disease ([place])   . Lymph edema   . Lymphedema, not elsewhere classified   . Morbid (severe) obesity due to excess calories ([place])   . Obesity hypoventilation syndrome ([place])   . On home oxygen therapy    "2L; 24/7" (12/21/2018)  . Peritonitis, unspecified ([place])   . Pneumonia    in past  . Pulmonary hypertension ([place])   . Renal disorder    M/W/F [facility] Pt started dialysis in Dec.2016  . S/P colonoscopy 05/26/2007   Dr. Laural Golden sigmoid diverticulosis random biopsies benign  . S/P endoscopy 05/01/2009   Dr. Penelope Coop pill-induced esophageal ulcerations distal to midesophagus, 2 small ulcers in the antrum of the stomach  . Shortness of breath dyspnea    with any exertion or if heart rate  is irregular while on dialysis  . Sleep apnea    reports that she no longer needs CPAP due to weight loss  . Sleep apnea, unspecified   . Type 2 diabetes mellitus with diabetic nephropathy ([place])   . Type 2 diabetes  mellitus with hyperglycemia ([place])    [place]  Past Surgical History:  Procedure Laterality Date  . [place] KitchenHemodialysis catheter Right 11/28/2015  . A/V FISTULAGRAM N/A 07/26/2017   Procedure: A/V Fistulagram - Left Arm;  Surgeon: Serafina Mitchell, MD;  Location: [place] [facility];  Service: Cardiovascular;  Laterality: N/A;  . ABDOMINAL HYSTERECTOMY    . ADENOIDECTOMY    . AV FISTULA PLACEMENT Left 03/15/2016   Procedure: CREATION OF LEFT ARM ARTERIOVENOUS (AV) FISTULA  ;  Surgeon: Rosetta Posner, MD;  Location: [place];  Service: Vascular;  Laterality: Left;  . CAPD INSERTION N/A 08/30/2017   Procedure: LAPAROSCOPIC INSERTION CONTINUOUS AMBULATORY PERITONEAL DIALYSIS  (CAPD) CATHETER;  Surgeon: Clovis Riley, MD;  Location: [place];  Service: General;  Laterality: N/A;  . CESAREAN SECTION      x 2  . CRANIECTOMY SUBOCCIPITAL W/ CERVICAL LAMINECTOMY / CHIARI    . FISTULA SUPERFICIALIZATION Left 05/10/2016   Procedure: Left Arm FISTULA SUPERFICIALIZATION;  Surgeon: Rosetta Posner, MD;  [facility];  Service: Vascular;  Laterality: Left;  . IR GENERIC HISTORICAL  08/14/2016   IR REMOVAL TUN ACCESS W/ PORT W/O FL MOD SED 08/14/2016 Arne Cleveland, MD [facility]-INTER[facility] RAD  . IR SINUS/FIST TUBE CHK-NON GI  01/01/2019  . IR US GUIDE BX ASP/DRAIN  12/28/2018  . MINOR REMOVAL OF PERITONEAL DIALYSIS CATHETER N/A 04/26/2019   Procedure: REMOVAL OF INFECTED PERITONEAL DIALYSIS CATHETER;  Surgeon: Erroll Luna, MD;  Location: [place];  Service: General;  Laterality: N/A;  . PERIPHERAL VASCULAR BALLOON ANGIOPLASTY Left 10/18/2019   Procedure: PERIPHERAL VASCULAR BALLOON ANGIOPLASTY;  Surgeon: Marty Heck, MD;  Location: [place] [facility];  Service: Cardiovascular;  Laterality: Left;  arm fistula  . PERIPHERAL VASCULAR CATHETERIZATION Left 11/25/2016   Procedure: A/V Fistulagram;  Surgeon: Conrad Avenal, MD;  Location: [place] [facility];  Service: Cardiovascular;  Laterality: Left;  arm  . PORTACATH PLACEMENT  07/05/2012   Procedure: INSERTION PORT-A-CATH;  Surgeon: Donato Heinz, MD;  Location: [facility];  Service: General;  Laterality: Left;  subclavian  . portacath removal    . RIGHT HEART CATH N/A 08/04/2017   Procedure: RIGHT HEART CATH;  Surgeon: Jolaine Artist, MD;  Location: [place] [facility];  Service: Cardiovascular;  Laterality: N/A;  . THYROIDECTOMY, PARTIAL    . TONSILLECTOMY     FH  Family History  Problem Relation Age of Onset  . Colon cancer Mother 17  . Stroke Mother 89  . Coronary artery disease Mother    SH  reports that she has never smoked. She has never used smokeless tobacco. She reports that she does not drink alcohol and does not use drugs. Allergies  Allergies  Allergen  Reactions  . Contrast Media [Iodinated Diagnostic Agents] Anaphylaxis, Hives, Swelling and Other (See Comments)    Dye for cardiac cath. Tongue swells  . Pneumococcal Vaccines Swelling and Other (See Comments)    Turns skin black, and bodily swelling  . Vancomycin Nausea And Vomiting and Other (See Comments)    Infusion "made me feel like I was dying" had to be readmitted to hospital   Home medications Prior to Admission medications   Medication Sig Start Date End Date Taking? Authorizing Provider  amLODipine (NORVASC) 10 MG tablet Take 10 mg by mouth daily.   Yes [provider]  diphenhydrAMINE (BENADRYL) 25 mg capsule Take 25 mg by mouth daily as needed for allergies.    Yes [provider]  insulin detemir (LEVEMIR) 100 UNIT/ML injection Inject 0.03 mLs (3 Units total) into the skin daily. 04/30/19  Yes Rai, Ripudeep K, MD  lactulose (CHRONULAC) 10 GM/15ML solution Take 45 mLs (30 g total) by mouth 2 (two) times daily. For constipation 04/30/19  Yes Rai, Ripudeep K, MD  lanthanum (FOSRENOL) 500 MG chewable tablet Chew 1,000-1,500 mg by mouth 5 (five) times daily. 1500 mg by mouth three times a day with meals and 1000 mg by mouth twice a day with snacks   Yes [provider]  Melatonin 5 MG CAPS Take 5 mg by mouth at bedtime as needed (sleep).   Yes [provider]  midodrine (PROAMATINE) 10 MG tablet Take 1 tablet (10 mg total) by mouth 2 (two) times daily with a meal. 04/30/19  Yes Rai, Ripudeep K, MD  sertraline (ZOLOFT) 50 MG tablet Take 50 mg by mouth daily. 04/16/19  Yes [provider]  acetaminophen (TYLENOL) 500 MG tablet Take 1,000 mg by mouth every 6 (six) hours as needed for moderate pain or headache.    [provider]  albuterol (PROVENTIL) (2.5 MG/3ML) 0.083% nebulizer solution Take 2.5 mg by nebulization every 6 (six) hours as needed for wheezing or shortness of breath.    [provider]  albuterol (VENTOLIN HFA) 108  (90 Base) MCG/ACT inhaler Inhale 2 puffs into the lungs 4 (four) times daily.    [provider]  ALPRAZolam Duanne Moron) 0.5 MG tablet Take 0.5 mg by mouth 2 (two) times daily as needed for anxiety.    [provider]  loperamide (IMODIUM) 2 MG capsule Take 2-4 mg by mouth as needed for diarrhea or loose stools.     [provider]  multivitamin (RENA-VIT) TABS tablet Take 1 tablet by mouth daily. 05/30/18   [provider]  nystatin (NYSTATIN) powder Apply 1 application topically 2 (two) times daily.    [provider]  Oxycodone HCl 10 MG TABS Take 1 tablet (10 mg total) by mouth 4 (four) times daily as needed (pain). 02/01/17   [name], MD  insulin lispro (HUMALOG) 100 UNIT/ML injection Inject 60 Units into the skin 2 (two) times daily.    03/08/12  [provider]    Current Medications Scheduled Meds: . enoxaparin (LOVENOX) injection  40 mg Subcutaneous Q24H  . insulin aspart  0-9 Units Subcutaneous TID WC   Continuous Infusions: . ceFEPime (MAXIPIME) IV    . metronidazole 500 mg (06/30/20 1211)   PRN Meds:.acetaminophen **OR** acetaminophen, ondansetron **OR** ondansetron (ZOFRAN) IV  CBC Recent Labs  Lab 06/30/20 0328  WBC 16.6*  NEUTROABS 15.3*  HGB 12.2  HCT 37.6  MCV 99.2  PLT 665   Basic Metabolic Panel Recent Labs  Lab 06/30/20 0328  NA 134*  K 5.4*  CL 96*  CO2 22  GLUCOSE 237*  BUN 63*  CREATININE 11.11*  CALCIUM 9.0    Physical Exam  Blood pressure 111/65, pulse (!) 103, temperature 98.8 F (37.1 C), temperature source Oral, resp. rate 18, height _0  (1.651 m), weight 135 kg, SpO2 98 %. GEN: NAD, resting, conversant, aao x3 ENT: ncat EYES: EOMI CV: RRR nl s1s2 PULM: CTAB, nl wob ABD: s/nt/nd SKIN: no rashes/lesions LDJ:TTSVXBL soft LEE, lymphedema VASC: LFA AVF +B/T   Assessment 30F ESRD MWF with fever/leukocytosis/sepsis, w/u in process, on broad spectrum ABX  1. ESRD MWF LFA AVF  [name] 2. Sepsis / Febrile illness per TRH, Cefepeim and Flagyl 3. Hyperkalemia,  mild 4. HTN, BP stable 5. CKD-BMD, Ca ok 6. Anemia, Hb 12,2 stabler 7. Chronic lymphedema   Plan 1. HD today, 2K, 4h, 2-3L UF, tight heparin, use AVF 2. Will follow along  Rexene Agent

## 2020-06-30 NOTE — Progress Notes (Signed)
Patient states she no longer wears CPAP due to weight loss. She declined use. CPAP order DC.

## 2020-06-30 NOTE — Progress Notes (Signed)
[place] Sepsis Note:  Patient BC was drawn before ABX was administered. Had a total of 1,800 ml IVF bolus. MD elected to not administer more due to patient being a dialysis patient. LA 2.3 & 1.8.   Samrat Hayward DNP Elink RN 06:03 AM

## 2020-06-30 NOTE — Care Management Important Message (Deleted)
Important Message  Patient Details  Name: [name] MRN: [id number] Date of Birth: 1966-08-10   Medicare Important Message Given:  Yes     [name] 06/30/2020, 4:17 PM

## 2020-06-30 NOTE — Progress Notes (Signed)
Pharmacy Antibiotic Note  [name] is a 54 y.o. female admitted on 06/30/2020 with sepsis.  Pharmacy has been consulted for cefepime dosing.  Of note pt had three-week treatment for peritonitis growing Staph epidermidis 14moago.  Plan: Cefepime 2g IV on HD days.  Height: 5' 5" (165.1 cm) Weight: 135 kg (297 lb 9.9 oz) IBW/kg (Calculated) : 57  Temp (24hrs), Avg:102.9 F (39.4 C), Min:102.7 F (39.3 C), Max:103 F (39.4 C)  Recent Labs  Lab 06/30/20 0328  WBC 16.6*  CREATININE 11.11*  LATICACIDVEN 2.3*    Estimated Creatinine Clearance: 8.2 mL/min (A) (by C-G formula based on SCr of 11.11 mg/dL (H)).    Allergies  Allergen Reactions  . Contrast Media [Iodinated Diagnostic Agents] Anaphylaxis, Hives, Swelling and Other (See Comments)    Dye for cardiac cath. Tongue swells  . Pneumococcal Vaccines Swelling and Other (See Comments)    Turns skin black, and bodily swelling  . Vancomycin Nausea And Vomiting and Other (See Comments)    Infusion "made me feel like I was dying" had to be readmitted to hospital     Thank you for allowing pharmacy to be a part of this patient's care.  VWynona Neat PharmD, BCPS  06/30/2020 5:15 AM

## 2020-06-30 NOTE — ED Notes (Signed)
Date and time results received: 06/30/20 0407  Test: Lactic Critical Value: 2.3  Name of Provider Notified: Tomi Bamberger, MD  Orders Received? Or Actions Taken?: acknowledged

## 2020-06-30 NOTE — ED Provider Notes (Signed)
[facility] EMERGENCY DEPARTMENT Provider Note   CSN: [id number] Arrival date & time: 06/30/20  6433   Time seen 3:30 AM  History Chief Complaint  Patient presents with   Fever    [name] is a 54 y.o. female.  HPI   Patient presents via EMS for fever that started this evening on July 11.  She states she had chills.  She denies cough, shortness of breath, or chest pain.  She states she has a little shortness of breath.  She has some nausea without vomiting or diarrhea.  She denies having any urinary output.  Patient states she is having a wound on her left leg above the knee and the posterior thigh that has been draining.  Patient is a dialysis patient and goes to dialysis on Monday (today), Wednesdays, and Friday.  She states she has not missed.  She reported taking 2 Covid vaccines to nursing staff.  PCP [name], MD   Past Medical History:  Diagnosis Date   Anemia, unspecified    Anxiety    Asthma    as a child   Cancer ([place])    thyroid   Cellulitis    CHF (congestive heart failure) ([place])    Chiari malformation    s/p surgery   Chiari malformation    Chronic kidney disease, stage 5 ([place])    Chronic obstructive pulmonary disease, unspecified ([place])    Chronic pain    Complication of anesthesia 11/28/15   Resp arrest after  conscious  sedation   Compression of brain (HCC)    Constipation    Coronary artery disease    40-50% mid LAD 04/29/09, Medical tx. (Dr. Gwenlyn Found)   Diabetes mellitus    Type II- reports being off all medication d/t it being controlled   Essential (primary) hypertension    Fibromyalgia    Fibromyalgia    History of blood transfusion    hemorrage duinrg pregancy   Hx of echocardiogram 10/2011   EF 55-60%   Hypercholesterolemia    Hyperlipidemia, unspecified    Hypertension    Hypertensive chronic kidney disease with stage 1 through stage 4 chronic kidney disease, or unspecified chronic kidney disease     Hypertensive chronic kidney disease with stage 5 chronic kidney disease or end stage renal disease (HCC)    Lymph edema    Lymphedema, not elsewhere classified    Morbid (severe) obesity due to excess calories (HCC)    Obesity hypoventilation syndrome ([place])    On home oxygen therapy    "2L; 24/7" (12/21/2018)   Peritonitis, unspecified ([place])    Pneumonia    in past   Pulmonary hypertension ([place])    Renal disorder    M/W/F [facility] Pt started dialysis in Dec.2016   S/P colonoscopy 05/26/2007   Dr. Laural Golden sigmoid diverticulosis random biopsies benign   S/P endoscopy 05/01/2009   Dr. Penelope Coop pill-induced esophageal ulcerations distal to midesophagus, 2 small ulcers in the antrum of the stomach   Shortness of breath dyspnea    with any exertion or if heart rate  is irregular while on dialysis   Sleep apnea    reports that she no longer needs CPAP due to weight loss   Sleep apnea, unspecified    Type 2 diabetes mellitus with diabetic nephropathy ([place])    Type 2 diabetes mellitus with hyperglycemia [facility])     Patient Active Problem List   Diagnosis Date Noted   Sepsis due to undetermined organism ([place])  06/30/2020   Anemia, unspecified    Essential (primary) hypertension    Hyperlipidemia, unspecified    Type 2 diabetes mellitus with hyperglycemia (HCC)    Chronic kidney disease, stage 5 (HCC)    Chronic obstructive pulmonary disease, unspecified (HCC)    Fibromyalgia    Hypertensive chronic kidney disease with stage 1 through stage 4 chronic kidney disease, or unspecified chronic kidney disease    Hypertensive chronic kidney disease with stage 5 chronic kidney disease or end stage renal disease (HCC)    Lymphedema, not elsewhere classified    Morbid (severe) obesity due to excess calories ([place])    Peritonitis, unspecified ([place])    Type 2 diabetes mellitus with diabetic nephropathy (HCC)    Abdominal pain 04/20/2019   Peritonitis ([place])  12/21/2018   Abdominal pain, LLQ 12/21/2018   Influenza B 12/21/2018   SOB (shortness of breath) 06/27/2018   Hypokalemia 06/27/2018   Chronic renal failure    Septic shock ([place]) 01/24/2017   Shock liver 01/24/2017   Cor pulmonale, chronic (HCC) 01/24/2017   Elevated troponin    Flu-like symptoms 01/18/2017   Fever 01/18/2017   HCAP (healthcare-associated pneumonia) 12/31/2016   Numbness and tingling in right hand 08/13/2016   Sepsis ([place]) 08/11/2016   Coagulase negative Staphylococcus bacteremia 06/11/2016   ESRD (end stage renal disease) ([place]) 06/06/2016   Chronic cholecystitis 12/09/2015   Protein-calorie malnutrition, severe 11/29/2015   Respiratory arrest ([place]) 11/28/2015   Recurrent cellulitis of lower leg 11/13/2015   Chronic diastolic CHF (congestive heart failure) ([place]) 11/13/2015   Chronic respiratory failure with hypoxia ([place]) 11/13/2015   ESRD on dialysis ([place]) 11/13/2015   Anemia of chronic disease 11/13/2015   Rotator cuff syndrome of right shoulder 05/30/2013   Pain in joint, shoulder region 05/30/2013   Obesity hypoventilation syndrome ([place]) 09/04/2012   Leukopenia 11/11/2011   HTN (hypertension) 11/11/2011   HLD (hyperlipidemia) 11/11/2011   Transaminitis 11/11/2011   Diabetes mellitus ([place]) 10/27/2011   Morbid obesity ([place]) 10/27/2011   Lymphedema of lower extremity 10/04/2011    Past Surgical History:  Procedure Laterality Date   .Hemodialysis catheter Right 11/28/2015   A/V FISTULAGRAM N/A 07/26/2017   Procedure: A/V Fistulagram - Left Arm;  Surgeon: Serafina Mitchell, MD;  Location: [facility];  Service: Cardiovascular;  Laterality: N/A;   ABDOMINAL HYSTERECTOMY     ADENOIDECTOMY     AV FISTULA PLACEMENT Left 03/15/2016   Procedure: CREATION OF LEFT ARM ARTERIOVENOUS (AV) FISTULA  ;  Surgeon: Rosetta Posner, MD;  Location: [facility];  Service: Vascular;  Laterality: Left;   CAPD INSERTION N/A 08/30/2017    Procedure: LAPAROSCOPIC INSERTION CONTINUOUS AMBULATORY PERITONEAL DIALYSIS  (CAPD) CATHETER;  Surgeon: Clovis Riley, MD;  Location: [place];  Service: General;  Laterality: N/A;   CESAREAN SECTION      x 2   CRANIECTOMY SUBOCCIPITAL W/ CERVICAL LAMINECTOMY / CHIARI     FISTULA SUPERFICIALIZATION Left 05/10/2016   Procedure: Left Arm FISTULA SUPERFICIALIZATION;  Surgeon: Rosetta Posner, MD;  Location: [facility];  Service: Vascular;  Laterality: Left;   IR GENERIC HISTORICAL  08/14/2016   IR REMOVAL TUN ACCESS W/ PORT W/O FL MOD SED 08/14/2016 Arne Cleveland, MD [facility]-INTERV RAD   IR SINUS/FIST TUBE CHK-NON GI  01/01/2019   IR US GUIDE BX ASP/DRAIN  12/28/2018   MINOR REMOVAL OF PERITONEAL DIALYSIS CATHETER N/A 04/26/2019   Procedure: REMOVAL OF INFECTED PERITONEAL DIALYSIS CATHETER;  Surgeon: Erroll Luna, MD;  [facility]  OR;  Service: General;  Laterality: N/A;   PERIPHERAL VASCULAR BALLOON ANGIOPLASTY Left 10/18/2019   Procedure: PERIPHERAL VASCULAR BALLOON ANGIOPLASTY;  Surgeon: Marty Heck, MD;  Location: [place] [facility];  Service: Cardiovascular;  Laterality: Left;  arm fistula   PERIPHERAL VASCULAR CATHETERIZATION Left 11/25/2016   Procedure: A/V Fistulagram;  Surgeon: Conrad Spencer, MD;  Location: [place];  Service: Cardiovascular;  Laterality: Left;  arm   PORTACATH PLACEMENT  07/05/2012   Procedure: INSERTION PORT-A-CATH;  Surgeon: Donato Heinz, MD;  Location: [facility];  Service: General;  Laterality: Left;  subclavian   portacath removal     RIGHT HEART CATH N/A 08/04/2017   Procedure: RIGHT HEART CATH;  Surgeon: Jolaine Artist, MD;  Location: [place] [facility];  Service: Cardiovascular;  Laterality: N/A;   THYROIDECTOMY, PARTIAL     TONSILLECTOMY       OB History    Gravida  4   Para  3   Term  2   Preterm  1   AB  1   Living        SAB  1   TAB      Ectopic      Multiple      Live Births              Family History    Problem Relation Age of Onset   Colon cancer Mother 5   Stroke Mother 67   Coronary artery disease Mother     Social History   Tobacco Use   Smoking status: Never Smoker   Smokeless tobacco: Never Used  Vaping Use   Vaping Use: Never used  Substance Use Topics   Alcohol use: No    Alcohol/week: 0.0 standard drinks   Drug use: No  lives at home  Home Medications Prior to Admission medications   Medication Sig Start Date End Date Taking? Authorizing Provider  acetaminophen (TYLENOL) 500 MG tablet Take 1,000 mg by mouth every 6 (six) hours as needed for moderate pain or headache.    [provider]  albuterol (PROVENTIL) (2.5 MG/3ML) 0.083% nebulizer solution Take 2.5 mg by nebulization every 6 (six) hours as needed for wheezing or shortness of breath.    [provider]  albuterol (VENTOLIN HFA) 108 (90 Base) MCG/ACT inhaler Inhale 2 puffs into the lungs 4 (four) times daily.    [provider]  ALPRAZolam Duanne Moron) 0.5 MG tablet Take 0.5 mg by mouth 2 (two) times daily as needed for anxiety.    [provider]  amLODipine (NORVASC) 10 MG tablet Take 10 mg by mouth daily.    [provider]  diphenhydrAMINE (BENADRYL) 25 mg capsule Take 25 mg by mouth daily as needed for allergies.     [provider]  insulin detemir (LEVEMIR) 100 UNIT/ML injection Inject 0.03 mLs (3 Units total) into the skin daily. Patient not taking: Reported on 10/17/2019 04/30/19   Rai, Vernelle Emerald, MD  lactulose (CHRONULAC) 10 GM/15ML solution Take 45 mLs (30 g total) by mouth 2 (two) times daily. For constipation Patient not taking: Reported on 10/17/2019 04/30/19   Rai, Vernelle Emerald, MD  lanthanum (FOSRENOL) 500 MG chewable tablet Chew 1,000-1,500 mg by mouth 5 (five) times daily. 1500 mg by mouth three times a day with meals and 1000 mg by mouth twice a day with snacks    [provider]  loperamide (IMODIUM) 2 MG capsule Take 2-4 mg by mouth  as needed for diarrhea or loose stools.     [provider]  Melatonin 5 MG CAPS Take 5 mg by mouth at bedtime as needed (sleep).    [provider]  midodrine (PROAMATINE) 10 MG tablet Take 1 tablet (10 mg total) by mouth 2 (two) times daily with a meal. Patient not taking: Reported on 10/17/2019 04/30/19   Rai, Vernelle Emerald, MD  multivitamin (RENA-VIT) TABS tablet Take 1 tablet by mouth daily. 05/30/18   [provider]  nystatin (NYSTATIN) powder Apply 1 application topically 2 (two) times daily.    [provider]  Oxycodone HCl 10 MG TABS Take 1 tablet (10 mg total) by mouth 4 (four) times daily as needed (pain). 02/01/17   [name], MD  sertraline (ZOLOFT) 50 MG tablet Take 50 mg by mouth daily. 04/16/19   [provider]  insulin lispro (HUMALOG) 100 UNIT/ML injection Inject 60 Units into the skin 2 (two) times daily.    03/08/12  [provider]    Allergies    Contrast media [iodinated diagnostic agents], Pneumococcal vaccines, and Vancomycin  Review of Systems   Review of Systems  All other systems reviewed and are negative.   Physical Exam Updated Vital Signs BP (!) 146/71    Pulse (!) 113    Temp (!) 102.7 F (39.3 C) (Oral)    Resp (!) 21    Ht _0  (1.651 m)    Wt 135 kg    SpO2 94%    BMI 49.53 kg/m   Physical Exam Vitals and nursing note reviewed.  Constitutional:      General: She is in acute distress.     Appearance: Normal appearance. She is obese.  HENT:     Head: Normocephalic and atraumatic.     Right Ear: External ear normal.     Left Ear: External ear normal.  Eyes:     Extraocular Movements: Extraocular movements intact.     Conjunctiva/sclera: Conjunctivae normal.     Pupils: Pupils are equal, round, and reactive to light.  Cardiovascular:     Rate and Rhythm: Tachycardia present.     Pulses: Normal pulses.  Pulmonary:     Effort: Tachypnea and respiratory distress present.     Breath sounds:  No decreased breath sounds, wheezing or rhonchi.  Musculoskeletal:        General: Normal range of motion.     Cervical back: Normal range of motion.     Comments: Patient has an AV fistula in her left forearm proximally with good bruit and thrill. When I review her lower extremities she is noted to have some thickening of the skin especially of the left lower extremity below the knee with hyperpigmentation.  The nursing staff held her leg up and when I inspect the posterior portion of her left thigh and left calf area there is no obvious open lesion seen, there is no drainage site seen.  Neurological:     General: No focal deficit present.     Mental Status: She is alert.     Cranial Nerves: No cranial nerve deficit.     Comments: Patient is oriented to the year and president, however she cannot tell me the day of the week or what month it is.  Psychiatric:        Mood and Affect: Affect is flat.        Speech: Speech is delayed.        Behavior: Behavior is  slowed.     ED Results / Procedures / Treatments   Labs (all labs ordered are listed, but only abnormal results are displayed) Results for orders placed or performed during the hospital encounter of 06/30/20  SARS Coronavirus 2 by RT PCR (hospital order, performed in [place] hospital lab) Nasopharyngeal Nasopharyngeal Swab   Specimen: Nasopharyngeal Swab  Result Value Ref Range   SARS Coronavirus 2 NEGATIVE NEGATIVE  Lactic acid, plasma  Result Value Ref Range   Lactic Acid, Venous 2.3 (HH) 0.5 - 1.9 mmol/L  Comprehensive metabolic panel  Result Value Ref Range   Sodium 134 (L) 135 - 145 mmol/L   Potassium 5.4 (H) 3.5 - 5.1 mmol/L   Chloride 96 (L) 98 - 111 mmol/L   CO2 22 22 - 32 mmol/L   Glucose, Bld 237 (H) 70 - 99 mg/dL   BUN 63 (H) 6 - 20 mg/dL   Creatinine, Ser 11.11 (H) 0.44 - 1.00 mg/dL   Calcium 9.0 8.9 - 10.3 mg/dL   Total Protein 7.9 6.5 - 8.1 g/dL   Albumin 4.0 3.5 - 5.0 g/dL   AST 16 15 - 41 U/L   ALT  13 0 - 44 U/L   Alkaline Phosphatase 270 (H) 38 - 126 U/L   Total Bilirubin 0.5 0.3 - 1.2 mg/dL   GFR calc non Af Amer 3 (L) >60 mL/min   GFR calc Af Amer 4 (L) >60 mL/min   Anion gap 16 (H) 5 - 15  CBC WITH DIFFERENTIAL  Result Value Ref Range   WBC 16.6 (H) 4.0 - 10.5 K/uL   RBC 3.79 (L) 3.87 - 5.11 MIL/uL   Hemoglobin 12.2 12.0 - 15.0 g/dL   HCT 37.6 36 - 46 %   MCV 99.2 80.0 - 100.0 fL   MCH 32.2 26.0 - 34.0 pg   MCHC 32.4 30.0 - 36.0 g/dL   RDW 13.4 11.5 - 15.5 %   Platelets 209 150 - 400 K/uL   nRBC 0.0 0.0 - 0.2 %   Neutrophils Relative % 92 %   Neutro Abs 15.3 (H) 1.7 - 7.7 K/uL   Lymphocytes Relative 4 %   Lymphs Abs 0.6 (L) 0.7 - 4.0 K/uL   Monocytes Relative 3 %   Monocytes Absolute 0.5 0 - 1 K/uL   Eosinophils Relative 0 %   Eosinophils Absolute 0.0 0 - 0 K/uL   Basophils Relative 0 %   Basophils Absolute 0.0 0 - 0 K/uL   Immature Granulocytes 1 %   Abs Immature Granulocytes 0.10 (H) 0.00 - 0.07 K/uL  APTT  Result Value Ref Range   aPTT 29 24 - 36 seconds  Protime-INR  Result Value Ref Range   Prothrombin Time 14.2 11.4 - 15.2 seconds   INR 1.1 0.8 - 1.2  D-dimer, quantitative  Result Value Ref Range   D-Dimer, Quant 0.95 (H) 0.00 - 0.50 ug/mL-FEU  Procalcitonin  Result Value Ref Range   Procalcitonin 5.09 ng/mL  Lactate dehydrogenase  Result Value Ref Range   LDH 157 98 - 192 U/L  Ferritin  Result Value Ref Range   Ferritin 865 (H) 11 - 307 ng/mL  Triglycerides  Result Value Ref Range   Triglycerides 136 <150 mg/dL  Fibrinogen  Result Value Ref Range   Fibrinogen 584 (H) 210 - 475 mg/dL  C-reactive protein  Result Value Ref Range   CRP 6.2 (H) <1.0 mg/dL  Brain natriuretic peptide  Result Value Ref Range   B Natriuretic Peptide  85.0 0.0 - 100.0 pg/mL   Laboratory interpretation all normal except leukocytosis, elevated ferritin, CRP, lactic acid, chronic renal failure with mild hyperkalemia, hyperglycemia nonfasting, elevated D-dimer but the  lowest it has been over the last 10 years    EKG EKG Interpretation  Date/Time:  Monday June 30 2020 03:46:04 EDT Ventricular Rate:  110 PR Interval:    QRS Duration: 90 QT Interval:  316 QTC Calculation: 428 R Axis:   112 Text Interpretation: Sinus tachycardia Right axis deviation Low voltage, extremity and precordial leads Since last tracing rate faster 20 Apr 2019 Confirmed by Rolland Porter [phone]) on 06/30/2020 3:51:05 AM   Radiology DG Chest Port 1 View  Result Date: 06/30/2020 CLINICAL DATA:  Fever EXAM: PORTABLE CHEST 1 VIEW COMPARISON:  05/11/2019 FINDINGS: The heart size and mediastinal contours are within normal limits. Both lungs are clear. The visualized skeletal structures are unremarkable. IMPRESSION: No active disease. Electronically Signed   By: Ulyses Jarred M.D.   On: 06/30/2020 03:55    Procedures Procedures (including critical care time)  Medications Ordered in ED Medications  metroNIDAZOLE (FLAGYL) IVPB 500 mg (has no administration in time range)  sodium chloride 0.9 % bolus 10 mL (has no administration in time range)  ceFEPIme (MAXIPIME) 2 g in sodium chloride 0.9 % 100 mL IVPB (has no administration in time range)  enoxaparin (LOVENOX) injection 40 mg (has no administration in time range)  acetaminophen (TYLENOL) tablet 650 mg (has no administration in time range)    Or  acetaminophen (TYLENOL) suppository 650 mg (has no administration in time range)  ondansetron (ZOFRAN) tablet 4 mg (has no administration in time range)    Or  ondansetron (ZOFRAN) injection 4 mg (has no administration in time range)  insulin aspart (novoLOG) injection 0-9 Units (has no administration in time range)  lactated ringers bolus 1,000 mL (0 mLs Intravenous Stopped 06/30/20 0504)    And  lactated ringers bolus 800 mL (0 mLs Intravenous Stopped 06/30/20 0504)  acetaminophen (TYLENOL) tablet 1,000 mg (1,000 mg Oral Given 06/30/20 0343)  ceFEPIme (MAXIPIME) 2 g in sodium chloride 0.9  % 100 mL IVPB (0 g Intravenous Stopped 06/30/20 0504)  metroNIDAZOLE (FLAGYL) IVPB 500 mg (0 mg Intravenous Stopped 06/30/20 0523)  clindamycin (CLEOCIN) IVPB 900 mg (0 mg Intravenous Stopped 06/30/20 0504)    ED Course  I have reviewed the triage vital signs and the nursing notes.  Pertinent labs & imaging results that were available during my care of the patient were reviewed by me and considered in my medical decision making (see chart for details).    MDM Rules/Calculators/A&P                          Patient was started on sepsis protocol, she was given IV fluid bolus per BMI over 25, she was started on IV antibiotics for unknown source.  Patient has had problems with vancomycin in the past, I substituted clindamycin.  Covid testing was done.  When I review her chart she was seen in May 2020 with fever but was able to be discharged home from the ED to get antibiotics done during dialysis.  She had been admitted earlier in that month for peritonitis from getting peritoneal dialysis and was changed over to hemodialysis that month.  Her cultures from the peritoneal fluid grew out staph epidermidis.  She was treated with vancomycin for 3 weeks.  Patient's tests have resulted, there still was no obvious  source for her fever.  She does have an AV fistula not a graft.  05:08 AM Dr. Olevia Bowens hospitalist will admit  Patient was given acetaminophen 1000 mg, her repeat temperature was still elevated at 102.7.  I went in with nursing staff and we rolled patient over, there were no sacral decubiti seen.  There were 2 small superficial ulcers that I could see now on her posterior left thigh that were approximately half centimeter in size, very superficial, no drainage, they looked noninfected, the area around it did not look inflamed.  I discussed with the patient that we did not have an obvious source for her fever, however she was given antibiotics that should cover most type of infections.  Her Covid was  negative.  She understands the need for admission.  Final Clinical Impression(s) / ED Diagnoses Final diagnoses:  Febrile illness  SIRS (systemic inflammatory response syndrome) ([place])    Rx / DC Orders  Plan admission  Rolland Porter, MD, Barbette Or, MD 06/30/20 [phone]

## 2020-06-30 NOTE — H&P (Signed)
History and Physical    [name] LKT:[id number] DOB: 02-23-1966 DOA: 06/30/2020  PCP: [name], MD   Patient coming from: Home.  I have personally briefly reviewed patient's old medical records in [place]  Chief Complaint: Fever.  HPI: [name] is a 54 y.o. female with medical history significant of anemia, anxiety, asthma/COPD, ESRD on hemodialysis, history of cellulitis, history of thyroid cancer, CAD, chronic diastolic heart failure, history of Chiari malformation s/p surgery, chronic pain, history of respiratory arrest after conscious sedation, history of compression of brain, constipation, type II DM, fibromyalgia, essential hypertension, hyperlipidemia, lymphedema, morbid obesity, obesity hypoventilation syndrome, sleep apnea, history of pneumonia, history of peritonitis, pulmonary hypertension who is coming to the emergency department due to fever since yesterday evening associated with chills and fatigue.  She denies headache, sore throat, rhinorrhea, dyspnea, wheezing or hemoptysis.  No chest pain, dizziness, palpitations, diaphoresis, PND or orthopnea.  She denies abdominal pain, nausea, vomiting, diarrhea, constipation, melena or hematochezia.  She states that she normally does not urinate since being on dialysis..  ED Course: Initial vital signs were temperature 103 F, pulse 110, respiration 20, blood pressure 157/79 mmHg and O2 sat 96% on room air.  She received 1800 mL of LR bolus, cefepime, clindamycin and metronidazole.  She has received vancomycin in the past in the hospital, but had a severe reaction the dialysis center while infusing clindamycin after her hospitalization for peritonitis from May 2020.  Blood cultures were drawn before antibiotic therapy.  White count 6.6, hemoglobin 12.2 g/dL and platelets 209.  PT is/INR/PTT within normal range.  D-dimer was elevated at 0.95.  Fibrinogen was 584.  Ferritin 865, CRP 6.2.  Procalcitonin was 5.09  ng/mL.  BNP was 85.0 pg/mL.  Lactic acid was 2.3 and then 1.8 mmol/L.  CMP showed a sodium of 134, potassium 5.4, chloride 96 and CO2 22 mmol/L.  Glucose 237, BUN 63 and creatinine 11.11.  Her LFTs are within normal range, except for an alkaline phosphatase of 217.  Her chest radiograph did not have any acute cardiopulmonary pathology.  Review of Systems: As per HPI otherwise all other systems reviewed and are negative.  Past Medical History:  Diagnosis Date  . Anemia, unspecified   . Anxiety   . Asthma    as a child  . Cancer ([place])    thyroid  . Cellulitis   . CHF (congestive heart failure) ([place])   . Chiari malformation    s/p surgery  . Chiari malformation   . Chronic kidney disease, stage 5 ([place])   . Chronic obstructive pulmonary disease, unspecified ([place])   . Chronic pain   . Complication of anesthesia 11/28/15   Resp arrest after  conscious  sedation  . Compression of brain ([place])   . Constipation   . Coronary artery disease    40-50% mid LAD 04/29/09, Medical tx. (Dr. Gwenlyn Found)  . Diabetes mellitus    Type II- reports being off all medication d/t it being controlled  . Essential (primary) hypertension   . Fibromyalgia   . Fibromyalgia   . History of blood transfusion    hemorrage duinrg pregancy  . Hx of echocardiogram 10/2011   EF 55-60%  . Hypercholesterolemia   . Hyperlipidemia, unspecified   . Hypertension   . Hypertensive chronic kidney disease with stage 1 through stage 4 chronic kidney disease, or unspecified chronic kidney disease   . Hypertensive chronic kidney disease with stage 5 chronic kidney disease or end stage  renal disease ([place])   . Lymph edema   . Lymphedema, not elsewhere classified   . Morbid (severe) obesity due to excess calories ([place])   . Obesity hypoventilation syndrome ([place])   . On home oxygen therapy    "2L; 24/7" (12/21/2018)  . Peritonitis, unspecified ([place])   . Pneumonia    in past  . Pulmonary hypertension ([place])   . Renal disorder     M/W/F [facility] Pt started dialysis in Dec.2016  . S/P colonoscopy 05/26/2007   Dr. Laural Golden sigmoid diverticulosis random biopsies benign  . S/P endoscopy 05/01/2009   Dr. Penelope Coop pill-induced esophageal ulcerations distal to midesophagus, 2 small ulcers in the antrum of the stomach  . Shortness of breath dyspnea    with any exertion or if heart rate  is irregular while on dialysis  . Sleep apnea    reports that she no longer needs CPAP due to weight loss  . Sleep apnea, unspecified   . Type 2 diabetes mellitus with diabetic nephropathy ([place])   . Type 2 diabetes mellitus with hyperglycemia [facility])     Past Surgical History:  Procedure Laterality Date  . Marland KitchenHemodialysis catheter Right 11/28/2015  . A/V FISTULAGRAM N/A 07/26/2017   Procedure: A/V Fistulagram - Left Arm;  Surgeon: Serafina Mitchell, MD;  Location: [facility];  Service: Cardiovascular;  Laterality: N/A;  . ABDOMINAL HYSTERECTOMY    . ADENOIDECTOMY    . AV FISTULA PLACEMENT Left 03/15/2016   Procedure: CREATION OF LEFT ARM ARTERIOVENOUS (AV) FISTULA  ;  Surgeon: Rosetta Posner, MD;  Location: [facility];  Service: Vascular;  Laterality: Left;  . CAPD INSERTION N/A 08/30/2017   Procedure: LAPAROSCOPIC INSERTION CONTINUOUS AMBULATORY PERITONEAL DIALYSIS  (CAPD) CATHETER;  Surgeon: Clovis Riley, MD;  Location: [facility];  Service: General;  Laterality: N/A;  . CESAREAN SECTION      x 2  . CRANIECTOMY SUBOCCIPITAL W/ CERVICAL LAMINECTOMY / CHIARI    . FISTULA SUPERFICIALIZATION Left 05/10/2016   Procedure: Left Arm FISTULA SUPERFICIALIZATION;  Surgeon: Rosetta Posner, MD;  [facility];  Service: Vascular;  Laterality: Left;  . IR GENERIC HISTORICAL  08/14/2016   IR REMOVAL TUN ACCESS W/ PORT W/O FL MOD SED 08/14/2016 Arne Cleveland, MD [facility]-INTERV RAD  . IR SINUS/FIST TUBE CHK-NON GI  01/01/2019  . IR US GUIDE BX ASP/DRAIN  12/28/2018  . MINOR REMOVAL OF PERITONEAL DIALYSIS CATHETER N/A 04/26/2019   Procedure: REMOVAL OF INFECTED  PERITONEAL DIALYSIS CATHETER;  Surgeon: Erroll Luna, MD;  Location: [facility];  Service: General;  Laterality: N/A;  . PERIPHERAL VASCULAR BALLOON ANGIOPLASTY Left 10/18/2019   Procedure: PERIPHERAL VASCULAR BALLOON ANGIOPLASTY;  Surgeon: Marty Heck, MD;  Location: [facility];  Service: Cardiovascular;  Laterality: Left;  arm fistula  . PERIPHERAL VASCULAR CATHETERIZATION Left 11/25/2016   Procedure: A/V Fistulagram;  Surgeon: Conrad Goessel, MD;  Location: [facility];  Service: Cardiovascular;  Laterality: Left;  arm  . PORTACATH PLACEMENT  07/05/2012   Procedure: INSERTION PORT-A-CATH;  Surgeon: Donato Heinz, MD;  Location: [facility];  Service: General;  Laterality: Left;  subclavian  . portacath removal    . RIGHT HEART CATH N/A 08/04/2017   Procedure: RIGHT HEART CATH;  Surgeon: Jolaine Artist, MD;  Location: [facility];  Service: Cardiovascular;  Laterality: N/A;  . THYROIDECTOMY, PARTIAL    . TONSILLECTOMY      Social History  reports that she has never  smoked. She has never used smokeless tobacco. She reports that she does not drink alcohol and does not use drugs.  Allergies  Allergen Reactions  . Contrast Media [Iodinated Diagnostic Agents] Anaphylaxis, Hives, Swelling and Other (See Comments)    Dye for cardiac cath. Tongue swells  . Pneumococcal Vaccines Swelling and Other (See Comments)    Turns skin black, and bodily swelling  . Vancomycin Nausea And Vomiting and Other (See Comments)    Infusion "made me feel like I was dying" had to be readmitted to hospital    Family History  Problem Relation Age of Onset  . Colon cancer Mother 34  . Stroke Mother 28  . Coronary artery disease Mother    Prior to Admission medications   Medication Sig Start Date End Date Taking? Authorizing Provider  acetaminophen (TYLENOL) 500 MG tablet Take 1,000 mg by mouth every 6 (six) hours as needed for moderate pain or headache.    [provider]    albuterol (PROVENTIL) (2.5 MG/3ML) 0.083% nebulizer solution Take 2.5 mg by nebulization every 6 (six) hours as needed for wheezing or shortness of breath.    [provider]  albuterol (VENTOLIN HFA) 108 (90 Base) MCG/ACT inhaler Inhale 2 puffs into the lungs 4 (four) times daily.    [provider]  ALPRAZolam Duanne Moron) 0.5 MG tablet Take 0.5 mg by mouth 2 (two) times daily as needed for anxiety.    [provider]  amLODipine (NORVASC) 10 MG tablet Take 10 mg by mouth daily.    [provider]  diphenhydrAMINE (BENADRYL) 25 mg capsule Take 25 mg by mouth daily as needed for allergies.     [provider]  insulin detemir (LEVEMIR) 100 UNIT/ML injection Inject 0.03 mLs (3 Units total) into the skin daily. Patient not taking: Reported on 10/17/2019 04/30/19   Rai, Vernelle Emerald, MD  lactulose (CHRONULAC) 10 GM/15ML solution Take 45 mLs (30 g total) by mouth 2 (two) times daily. For constipation Patient not taking: Reported on 10/17/2019 04/30/19   Rai, Vernelle Emerald, MD  lanthanum (FOSRENOL) 500 MG chewable tablet Chew 1,000-1,500 mg by mouth 5 (five) times daily. 1500 mg by mouth three times a day with meals and 1000 mg by mouth twice a day with snacks    [provider]  loperamide (IMODIUM) 2 MG capsule Take 2-4 mg by mouth as needed for diarrhea or loose stools.     [provider]  Melatonin 5 MG CAPS Take 5 mg by mouth at bedtime as needed (sleep).    [provider]  midodrine (PROAMATINE) 10 MG tablet Take 1 tablet (10 mg total) by mouth 2 (two) times daily with a meal. Patient not taking: Reported on 10/17/2019 04/30/19   Rai, Vernelle Emerald, MD  multivitamin (RENA-VIT) TABS tablet Take 1 tablet by mouth daily. 05/30/18   [provider]  nystatin (NYSTATIN) powder Apply 1 application topically 2 (two) times daily.    [provider]  Oxycodone HCl 10 MG TABS Take 1 tablet (10 mg total) by mouth 4 (four) times daily  as needed (pain). 02/01/17   [name], MD  sertraline (ZOLOFT) 50 MG tablet Take 50 mg by mouth daily. 04/16/19   [provider]  insulin lispro (HUMALOG) 100 UNIT/ML injection Inject 60 Units into the skin 2 (two) times daily.    03/08/12  [provider]   Physical Exam: Vitals:   06/30/20 0558 06/30/20 0600 06/30/20 0621 06/30/20 0630  BP:  113/62  123/62  Pulse:  (!) 117 (!) 117 (!) 114  Resp:  (!) 33 (!) 25 (!) 32  Temp: 99.9 F (37.7 C)     TempSrc: Oral     SpO2:  91% 94% 90%  Weight:      Height:       Constitutional: Chronically ill-appearing. Eyes: PERRL, lids and conjunctivae mildly injected. ENMT: Mucous membranes are moist. Posterior pharynx clear of any exudate or lesions. Neck: normal, supple, no masses, no thyromegaly Respiratory: Decreased breath sounds in bases, otherwise clear to auscultation bilaterally, no wheezing, no crackles. Normal respiratory effort. No accessory muscle use.  Cardiovascular: Tachycardic with a regular rhythm at 113 bpm, no murmurs / rubs / gallops.  No pitting edema with severe lymphedema/elephantiasis. 2+ pedal pulses. No carotid bruits.  Abdomen: Obese, nondistended.  BS positive.  Soft, no tenderness, no masses palpated. No hepatosplenomegaly. Musculoskeletal: no clubbing / cyanosis. Good ROM, no contractures. Normal muscle tone.  Skin: Bilateral severe lymphedema with elephantiasis Neurologic: CN 2-12 grossly intact. Sensation intact, DTR normal. Strength 5/5 in all 4.  Psychiatric: Normal judgment and insight. Alert and oriented x 3. Normal mood.   Labs on Admission: I have personally reviewed following labs and imaging studies  CBC: Recent Labs  Lab 06/30/20 0328  WBC 16.6*  NEUTROABS 15.3*  HGB 12.2  HCT 37.6  MCV 99.2  PLT 628    Basic Metabolic Panel: Recent Labs  Lab 06/30/20 0328  NA 134*  K 5.4*  CL 96*  CO2 22  GLUCOSE 237*  BUN 63*  CREATININE 11.11*  CALCIUM 9.0    GFR: Estimated  Creatinine Clearance: 8.2 mL/min (A) (by C-G formula based on SCr of 11.11 mg/dL (H)).  Liver Function Tests: Recent Labs  Lab 06/30/20 0328  AST 16  ALT 13  ALKPHOS 270*  BILITOT 0.5  PROT 7.9  ALBUMIN 4.0   Radiological Exams on Admission: DG Chest Port 1 View  Result Date: 06/30/2020 CLINICAL DATA:  Fever EXAM: PORTABLE CHEST 1 VIEW COMPARISON:  05/11/2019 FINDINGS: The heart size and mediastinal contours are within normal limits. Both lungs are clear. The visualized skeletal structures are unremarkable. IMPRESSION: No active disease. Electronically Signed   By: Ulyses Jarred M.D.   On: 06/30/2020 03:55    EKG: Independently reviewed.  Vent. rate 110 BPM PR interval * ms QRS duration 90 ms QT/QTc 316/428 ms P-R-T axes 62 112 79 Sinus tachycardia Right axis deviation Low voltage, extremity and precordial leads  Assessment/Plan Principal Problem:   Sepsis due to undetermined organism (HCC) Observation/telemetry. Received IVF earlier. Defer further IV fluids due to ESRD. Continue on cefepime, metronidazole and clindamycin. Follow-up blood culture sensitivity.  Active Problems:   ESRD on dialysis ([place]) On Mon/wed/Frischedule. Consult nephrology for dialysis orders. Continue Fosrenol once dose confirmed. Erythropoietin per nephrology.    Hyperkalemia Lokelma 5 g p.o. x1. Will be dialyzed today. Follow potassium level.    HTN (hypertension) On amlodipine 10 mg p.o. daily. Also on midodrine 10 mg p.o. twice daily? Monitor blood pressure.    Chronic diastolic CHF (congestive heart failure) (HCC) No signs of decompensation. ACE/ARB contraindicated. Diuretics no longer effective. May benefit from beta-blocker if no significant side effects.    Hyperlipidemia, unspecified Not on medical treatment.    Lymphedema, not elsewhere classified Supportive care.    Morbid (severe) obesity due to excess calories ([place]) Needs significant lifestyle  modifications. Follow-up with PCP and primary nephrologist.    Type 2 diabetes mellitus with  diabetic nephropathy (HCC) Carbohydrate modified diet. CBG monitoring with regular insulin sliding scale.    Anxiety On alprazolam and sertraline. Will need med reconciliation first.    DVT prophylaxis: Lovenox SQ. Code Status:   Full code. Family Communication: Disposition Plan:   Patient is from:  Home.  Anticipated DC to:  Home.  Anticipated DC date:  07/02/2020  Anticipated DC barriers: Clinical improvement. Consults called:  Will need nephrology consult. Admission status:  Observation/telemetry.  Severity of Illness:  High to very high.  Reubin Milan MD [facility]  How to contact the [facility] Attending or Consulting provider [place] or covering provider during after hours [place], for this patient?   1. Check the care team in [facility] and look for a) attending/consulting [facility] provider listed and b) the [facility] team listed 2. Log into [email or web address] and use 's universal password to access. If you do not have the password, please contact the hospital operator. 3. Locate the [facility] provider you are looking for under [facility] and page to a number that you can be directly reached. 4. If you still have difficulty reaching the provider, please page the [facility] (Director on Call) for the Hospitalists listed on amion for assistance.  06/30/2020, 6:52 AM   This document was prepared using Dragon voice recognition software and may contain some unintended transcription errors.

## 2020-07-01 DIAGNOSIS — L0390 Cellulitis, unspecified: Secondary | ICD-10-CM | POA: Diagnosis present

## 2020-07-01 DIAGNOSIS — Z85850 Personal history of malignant neoplasm of thyroid: Secondary | ICD-10-CM | POA: Diagnosis not present

## 2020-07-01 DIAGNOSIS — Z992 Dependence on renal dialysis: Secondary | ICD-10-CM | POA: Diagnosis not present

## 2020-07-01 DIAGNOSIS — I132 Hypertensive heart and chronic kidney disease with heart failure and with stage 5 chronic kidney disease, or end stage renal disease: Secondary | ICD-10-CM | POA: Diagnosis present

## 2020-07-01 DIAGNOSIS — J449 Chronic obstructive pulmonary disease, unspecified: Secondary | ICD-10-CM | POA: Diagnosis present

## 2020-07-01 DIAGNOSIS — I890 Lymphedema, not elsewhere classified: Secondary | ICD-10-CM | POA: Diagnosis present

## 2020-07-01 DIAGNOSIS — E1122 Type 2 diabetes mellitus with diabetic chronic kidney disease: Secondary | ICD-10-CM | POA: Diagnosis present

## 2020-07-01 DIAGNOSIS — R7881 Bacteremia: Secondary | ICD-10-CM | POA: Diagnosis present

## 2020-07-01 DIAGNOSIS — I2510 Atherosclerotic heart disease of native coronary artery without angina pectoris: Secondary | ICD-10-CM | POA: Diagnosis present

## 2020-07-01 DIAGNOSIS — F419 Anxiety disorder, unspecified: Secondary | ICD-10-CM | POA: Diagnosis present

## 2020-07-01 DIAGNOSIS — A419 Sepsis, unspecified organism: Secondary | ICD-10-CM | POA: Diagnosis present

## 2020-07-01 DIAGNOSIS — K5900 Constipation, unspecified: Secondary | ICD-10-CM | POA: Diagnosis present

## 2020-07-01 DIAGNOSIS — N186 End stage renal disease: Secondary | ICD-10-CM | POA: Diagnosis present

## 2020-07-01 DIAGNOSIS — Z20822 Contact with and (suspected) exposure to covid-19: Secondary | ICD-10-CM | POA: Diagnosis present

## 2020-07-01 DIAGNOSIS — G8929 Other chronic pain: Secondary | ICD-10-CM | POA: Diagnosis present

## 2020-07-01 DIAGNOSIS — E662 Morbid (severe) obesity with alveolar hypoventilation: Secondary | ICD-10-CM | POA: Diagnosis present

## 2020-07-01 DIAGNOSIS — Z6841 Body Mass Index (BMI) 40.0 and over, adult: Secondary | ICD-10-CM | POA: Diagnosis not present

## 2020-07-01 DIAGNOSIS — L97121 Non-pressure chronic ulcer of left thigh limited to breakdown of skin: Secondary | ICD-10-CM | POA: Diagnosis present

## 2020-07-01 DIAGNOSIS — E785 Hyperlipidemia, unspecified: Secondary | ICD-10-CM | POA: Diagnosis present

## 2020-07-01 DIAGNOSIS — A040 Enteropathogenic Escherichia coli infection: Secondary | ICD-10-CM | POA: Diagnosis not present

## 2020-07-01 DIAGNOSIS — E875 Hyperkalemia: Secondary | ICD-10-CM | POA: Diagnosis present

## 2020-07-01 DIAGNOSIS — Z8701 Personal history of pneumonia (recurrent): Secondary | ICD-10-CM | POA: Diagnosis not present

## 2020-07-01 DIAGNOSIS — I5032 Chronic diastolic (congestive) heart failure: Secondary | ICD-10-CM | POA: Diagnosis present

## 2020-07-01 DIAGNOSIS — M797 Fibromyalgia: Secondary | ICD-10-CM | POA: Diagnosis present

## 2020-07-01 DIAGNOSIS — E872 Acidosis: Secondary | ICD-10-CM | POA: Diagnosis present

## 2020-07-01 LAB — C DIFFICILE QUICK SCREEN W PCR REFLEX
C Diff antigen: NEGATIVE
C Diff interpretation: NOT DETECTED
C Diff toxin: NEGATIVE

## 2020-07-01 LAB — BLOOD CULTURE ID PANEL (REFLEXED)

## 2020-07-01 LAB — CBC WITH DIFFERENTIAL/PLATELET
Abs Immature Granulocytes: 0.08 10*3/uL — ABNORMAL HIGH (ref 0.00–0.07)
Basophils Absolute: 0 10*3/uL (ref 0.0–0.1)
Basophils Relative: 0 %
Eosinophils Absolute: 0.1 10*3/uL (ref 0.0–0.5)
Eosinophils Relative: 0 %
HCT: 32.4 % — ABNORMAL LOW (ref 36.0–46.0)
Hemoglobin: 10.4 g/dL — ABNORMAL LOW (ref 12.0–15.0)
Immature Granulocytes: 1 %
Lymphocytes Relative: 7 %
Lymphs Abs: 1 10*3/uL (ref 0.7–4.0)
MCH: 32.2 pg (ref 26.0–34.0)
MCHC: 32.1 g/dL (ref 30.0–36.0)
MCV: 100.3 fL — ABNORMAL HIGH (ref 80.0–100.0)
Monocytes Absolute: 0.4 10*3/uL (ref 0.1–1.0)
Monocytes Relative: 3 %
Neutro Abs: 12.7 10*3/uL — ABNORMAL HIGH (ref 1.7–7.7)
Neutrophils Relative %: 89 %
Platelets: 190 10*3/uL (ref 150–400)
RBC: 3.23 MIL/uL — ABNORMAL LOW (ref 3.87–5.11)
RDW: 13.7 % (ref 11.5–15.5)
WBC: 14.3 10*3/uL — ABNORMAL HIGH (ref 4.0–10.5)
nRBC: 0 % (ref 0.0–0.2)

## 2020-07-01 LAB — HEMOGLOBIN A1C
Hgb A1c MFr Bld: 7.2 % — ABNORMAL HIGH (ref 4.8–5.6)
Mean Plasma Glucose: 159.94 mg/dL

## 2020-07-01 LAB — COMPREHENSIVE METABOLIC PANEL
ALT: 11 U/L (ref 0–44)
AST: 12 U/L — ABNORMAL LOW (ref 15–41)
Albumin: 3.3 g/dL — ABNORMAL LOW (ref 3.5–5.0)
Alkaline Phosphatase: 199 U/L — ABNORMAL HIGH (ref 38–126)
Anion gap: 16 — ABNORMAL HIGH (ref 5–15)
BUN: 38 mg/dL — ABNORMAL HIGH (ref 6–20)
CO2: 23 mmol/L (ref 22–32)
Calcium: 9.2 mg/dL (ref 8.9–10.3)
Chloride: 97 mmol/L — ABNORMAL LOW (ref 98–111)
Creatinine, Ser: 7.43 mg/dL — ABNORMAL HIGH (ref 0.44–1.00)
GFR calc Af Amer: 7 mL/min — ABNORMAL LOW (ref >60)
GFR calc non Af Amer: 6 mL/min — ABNORMAL LOW (ref >60)
Glucose, Bld: 88 mg/dL (ref 70–99)
Potassium: 4.9 mmol/L (ref 3.5–5.1)
Sodium: 136 mmol/L (ref 135–145)
Total Bilirubin: 0.5 mg/dL (ref 0.3–1.2)
Total Protein: 6.7 g/dL (ref 6.5–8.1)

## 2020-07-01 LAB — GLUCOSE, CAPILLARY
Glucose-Capillary: 101 mg/dL — ABNORMAL HIGH (ref 70–99)
Glucose-Capillary: 186 mg/dL — ABNORMAL HIGH (ref 70–99)
Glucose-Capillary: 131 mg/dL — ABNORMAL HIGH (ref 70–99)
Glucose-Capillary: 175 mg/dL — ABNORMAL HIGH (ref 70–99)

## 2020-07-01 LAB — HIV ANTIBODY (ROUTINE TESTING W REFLEX): HIV Screen 4th Generation wRfx: NONREACTIVE

## 2020-07-01 MED ORDER — HYDROCODONE-ACETAMINOPHEN 7.5-325 MG PO TABS
1.0000 | ORAL_TABLET | Freq: Four times a day (QID) | ORAL | Status: DC | PRN
  Administered 2020-07-01: 2 via ORAL
  Administered 2020-07-02: 1 via ORAL
  Administered 2020-07-02: 2 via ORAL
  Administered 2020-07-03 – 2020-07-04 (×2): 1 via ORAL
  Filled 2020-07-01: qty 2
  Filled 2020-07-01 (×3): qty 1
  Filled 2020-07-01: qty 2

## 2020-07-01 MED ORDER — DARBEPOETIN ALFA 40 MCG/0.4ML IJ SOSY
40.0000 ug | PREFILLED_SYRINGE | Freq: Once | INTRAMUSCULAR | Status: AC
  Administered 2020-07-01: 40 ug via SUBCUTANEOUS
  Filled 2020-07-01 (×2): qty 0.4

## 2020-07-01 MED ORDER — LINEZOLID 600 MG/300ML IV SOLN
600.0000 mg | Freq: Two times a day (BID) | INTRAVENOUS | Status: DC
  Administered 2020-07-01 – 2020-07-02 (×3): 600 mg via INTRAVENOUS
  Filled 2020-07-01 (×5): qty 300

## 2020-07-01 MED ORDER — NEPRO/CARBSTEADY PO LIQD
237.0000 mL | Freq: Two times a day (BID) | ORAL | Status: DC
  Administered 2020-07-01 – 2020-07-04 (×7): 237 mL via ORAL

## 2020-07-01 MED ORDER — RENA-VITE PO TABS
1.0000 | ORAL_TABLET | Freq: Every day | ORAL | Status: DC
  Administered 2020-07-01 – 2020-07-03 (×3): 1 via ORAL
  Filled 2020-07-01 (×3): qty 1

## 2020-07-01 NOTE — Progress Notes (Signed)
CRITICAL VALUE ALERT  Critical Value:  Gram Positive Cocci in Aerobic and Anaerobic Bottles.   Date & Time Notied:  07/01/20 0215  Provider Notified: Dr. Olevia Bowens  Orders Received/Actions taken: No new orders at this time

## 2020-07-01 NOTE — Progress Notes (Signed)
Patient Demographics:    [name], is a 54 y.o. female, DOB - August 21, 1966, BBH:[id number]  Admit date - 06/30/2020   Admitting Physician Quantia Grullon Denton Brick, MD  Outpatient Primary MD for the patient is [name], MD  LOS - 0   Chief Complaint  Patient presents with  . Fever        Subjective:    [name] today has   no emesis,  No chest pain,   She has fevers and diarrhea   Assessment  & Plan :    Principal Problem:   Sepsis due to undetermined organism [facility]) Active Problems:   Hyperkalemia   HTN (hypertension)   Chronic diastolic CHF (congestive heart failure) (HCC)   ESRD on dialysis (HCC)   Hyperlipidemia, unspecified   Lymphedema, not elsewhere classified   Morbid (severe) obesity due to excess calories ([facility] [facility])   Type 2 diabetes mellitus with diabetic nephropathy ([place])   Bacteremia  Brief Summary:- 54 y.o. female with medical history significant of anemia, anxiety, asthma/COPD, ESRD on hemodialysis, cellulitis, thyroid cancer, CAD, chronic diastolic heart failure, Chiari malformation s/p surgery, chronic pain, type II DM, fibromyalgia,HLD, HTN, lymphedema, morbid obesity, obesity hypoventilation syndrome, sleep apnea,  pulmonary hypertension admitted on 06/30/2020 with sepsis   A/p 1)Bacteremia- fevers persist,  BCID is + for CONS in 1 bottle. PCT elevated 5.09, Notified MD and wanted to start linezolid(Vancomycin allergy).  --c/n linezolid and cefepime pending further ID and sensitivity -Continue Flagyl due to diarrhea -WBC down to 14.3 from 16.6 -On admission patient had fevers, tachycardia, tachypnea--- patient met SIRS criteria, no evidence of significant end-organ dysfunction per se at this time, patient does NOT meet sepsis criteria at this time  2)Diarrhea--- check stool for C. difficile and GI pathogen given fever -Continue Flagyl due to diarrhea  3)ESRD-continue  HD Monday Wednesdays and Fridays, nephrology consult appreciated  4)HTN-hold amlodipine and midodrine  5) chronic anemia of CKD--- ESA/Procrit per nephrology team  6) chronic lymphedema--- supportive care  7)HFpEF--stable, appears euvolemic, continue to use HD to address volume status  8)DM2--Use Novolog/Humalog Sliding scale insulin with Accu-Cheks/Fingersticks as ordered   9) depression and anxiety--- on Zoloft and alprazolam  Disposition/Need for in-Hospital Stay- patient unable to be discharged at this time due to --- bacteremia with persistent fevers requiring IV antibiotics pending further culture data  Status is: Inpatient  Remains inpatient appropriate because:bacteremia with persistent fevers requiring IV antibiotics pending further culture data   Disposition: The patient is from: Home              Anticipated d/c is to: Home              Anticipated d/c date is: 2 days              Patient currently is not medically stable to d/c. Barriers: Not Clinically Stable- -bacteremia with persistent fevers requiring IV antibiotics pending further culture data  Code Status : Full  Family Communication:    (patient is alert, awake and coherent)  Consults  :  Nephrology  DVT Prophylaxis  :  Lovenox -   Lab Results  Component Value Date   PLT 190 07/01/2020    Inpatient Medications  Scheduled Meds: . enoxaparin (LOVENOX) injection  30  mg Subcutaneous Q24H  . feeding supplement (NEPRO CARB STEADY)  237 mL Oral BID BM  . insulin aspart  0-9 Units Subcutaneous TID WC  . multivitamin  1 tablet Oral QHS   Continuous Infusions: . ceFEPime (MAXIPIME) IV 2 g (06/30/20 2020)  . linezolid (ZYVOX) IV 600 mg (07/01/20 1013)  . metronidazole 500 mg (07/01/20 1204)   PRN Meds:.acetaminophen **OR** acetaminophen, acetaminophen, HYDROcodone-acetaminophen, ondansetron **OR** ondansetron (ZOFRAN) IV    Anti-infectives (From admission, onward)   Start     Dose/Rate Route Frequency  Ordered Stop   07/01/20 1000  linezolid (ZYVOX) IVPB 600 mg     Discontinue     600 mg 300 mL/hr over 60 Minutes Intravenous Every 12 hours 07/01/20 0801     06/30/20 2000  ceFEPIme (MAXIPIME) 2 g in sodium chloride 0.9 % 100 mL IVPB     Discontinue     2 g 200 mL/hr over 30 Minutes Intravenous Every M-W-F (2000) 06/30/20 0515     06/30/20 1200  metroNIDAZOLE (FLAGYL) IVPB 500 mg     Discontinue     500 mg 100 mL/hr over 60 Minutes Intravenous Every 8 hours 06/30/20 0511     06/30/20 0515  ceFEPIme (MAXIPIME) 2 g in sodium chloride 0.9 % 100 mL IVPB  Status:  Discontinued        2 g 200 mL/hr over 30 Minutes Intravenous  Once 06/30/20 0511 06/30/20 0512   06/30/20 0345  ceFEPIme (MAXIPIME) 2 g in sodium chloride 0.9 % 100 mL IVPB        2 g 200 mL/hr over 30 Minutes Intravenous  Once 06/30/20 0343 06/30/20 0504   06/30/20 0345  metroNIDAZOLE (FLAGYL) IVPB 500 mg        500 mg 100 mL/hr over 60 Minutes Intravenous  Once 06/30/20 0343 06/30/20 0523   06/30/20 0345  clindamycin (CLEOCIN) IVPB 900 mg        900 mg 100 mL/hr over 30 Minutes Intravenous  Once 06/30/20 0343 06/30/20 0504        Objective:   Vitals:   07/01/20 0055 07/01/20 0217 07/01/20 0541 07/01/20 1430  BP: (!) 102/51  (!) 156/76 131/78  Pulse: 94  94 85  Resp:    18  Temp: 99.9 F (37.7 C) 98.9 F (37.2 C) 98.6 F (37 C) 98 F (36.7 C)  TempSrc: Oral Oral Oral Oral  SpO2: 97%  97% 98%  Weight:      Height:        Wt Readings from Last 3 Encounters:  06/30/20 (!) 138.7 kg  10/18/19 134 kg  05/11/19 (!) 141.6 kg     Intake/Output Summary (Last 24 hours) at 07/01/2020 1937 Last data filed at 07/01/2020 1700 Gross per 24 hour  Intake 1140 ml  Output --  Net 1140 ml   Physical Exam  Gen:- Awake Alert, morbidly obese HEENT:- [place].AT, No sclera icterus Neck-Supple Neck,No JVD,.  Lungs-  CTAB , fair symmetrical air movement CV- S1, S2 normal, regular  Abd-  +ve B.Sounds, Abd Soft, No tenderness,  increased truncal adiposity Extremity/Skin:-Lymphedema, pedal pulses present  Psych-affect is appropriate, oriented x3 Neuro-generalized weakness ,no new focal deficits, no tremors MSK-left arm AV fistula with positive thrill and bruit   Data Review:   Micro Results Recent Results (from the past 240 hour(s))  SARS Coronavirus 2 by RT PCR (hospital order, performed in [facility] hospital lab) Nasopharyngeal Nasopharyngeal Swab     Status: None   Collection Time:  06/30/20  3:36 AM   Specimen: Nasopharyngeal Swab  Result Value Ref Range Status   SARS Coronavirus 2 NEGATIVE NEGATIVE Final    Comment: (NOTE) SARS-CoV-2 target nucleic acids are NOT DETECTED.  The SARS-CoV-2 RNA is generally detectable in upper and lower respiratory specimens during the acute phase of infection. The lowest concentration of SARS-CoV-2 viral copies this assay can detect is 250 copies / mL. A negative result does not preclude SARS-CoV-2 infection and should not be used as the sole basis for treatment or other patient management decisions.  A negative result may occur with improper specimen collection / handling, submission of specimen other than nasopharyngeal swab, presence of viral mutation(s) within the areas targeted by this assay, and inadequate number of viral copies (<250 copies / mL). A negative result must be combined with clinical observations, patient history, and epidemiological information.  Fact Sheet for Patients:   StrictlyIdeas.no  Fact Sheet for Healthcare Providers: BankingDealers.co.za  This test is not yet approved or  cleared by the [place] FDA and has been authorized for detection and/or diagnosis of SARS-CoV-2 by FDA under an Emergency Use Authorization (EUA).  This EUA will remain in effect (meaning this test can be used) for the duration of the COVID-19 declaration under Section 564(b)(1) of the Act, 21 U.S.C. section  360bbb-3(b)(1), unless the authorization is terminated or revoked sooner.  Performed at [facility], [address] [place], [place] [address]   Blood Culture (routine x 2)     Status: None (Preliminary result)   Collection Time: 06/30/20  3:58 AM   Specimen: BLOOD RIGHT ARM  Result Value Ref Range Status   Specimen Description   Final    BLOOD RIGHT ARM Performed at [facility], [address], [place], [place] [address]    Special Requests   Final    BOTTLES DRAWN AEROBIC AND ANAEROBIC Blood Culture results may not be optimal due to an inadequate volume of blood received in culture bottles Performed at [place] [place], [place] [address]    Culture  Setup Time   Final    IN BOTH AEROBIC AND ANAEROBIC BOTTLES GRAM POSITIVE COCCI IN CLUSTERS Gram Stain Report Called to,Read Back By and Verified With: J RHUE,RN _0  07/01/20 MKELLY CRITICAL RESULT CALLED TO, READ BACK BY AND VERIFIED WITH: RN Deirdre Pippins [phone] FCP Performed at [facility], [address], [place], [place] [address]    Culture GRAM POSITIVE COCCI  Final   Report Status PENDING  Incomplete  Blood Culture ID Panel (Reflexed)     Status: Abnormal   Collection Time: 06/30/20  3:58 AM  Result Value Ref Range Status   Enterococcus species NOT DETECTED NOT DETECTED Final   Listeria monocytogenes NOT DETECTED NOT DETECTED Final   Staphylococcus species DETECTED (A) NOT DETECTED Final    Comment: Methicillin (oxacillin) susceptible coagulase negative staphylococcus. Possible blood culture contaminant (unless isolated from more than one blood culture draw or clinical case suggests pathogenicity). No antibiotic treatment is indicated for blood  culture contaminants. CRITICAL RESULT CALLED TO, READ BACK BY AND VERIFIED WITH: RN Deirdre Pippins [phone] FCP    Staphylococcus aureus (BCID) NOT DETECTED NOT DETECTED Final   Methicillin resistance NOT DETECTED NOT DETECTED Final    Streptococcus species NOT DETECTED NOT DETECTED Final   Streptococcus agalactiae NOT DETECTED NOT DETECTED Final   Streptococcus pneumoniae NOT DETECTED NOT DETECTED Final   Streptococcus pyogenes NOT DETECTED NOT DETECTED Final  Acinetobacter baumannii NOT DETECTED NOT DETECTED Final   Enterobacteriaceae species NOT DETECTED NOT DETECTED Final   Enterobacter cloacae complex NOT DETECTED NOT DETECTED Final   Escherichia coli NOT DETECTED NOT DETECTED Final   Klebsiella oxytoca NOT DETECTED NOT DETECTED Final   Klebsiella pneumoniae NOT DETECTED NOT DETECTED Final   Proteus species NOT DETECTED NOT DETECTED Final   Serratia marcescens NOT DETECTED NOT DETECTED Final   Haemophilus influenzae NOT DETECTED NOT DETECTED Final   Neisseria meningitidis NOT DETECTED NOT DETECTED Final   Pseudomonas aeruginosa NOT DETECTED NOT DETECTED Final   Candida albicans NOT DETECTED NOT DETECTED Final   Candida glabrata NOT DETECTED NOT DETECTED Final   Candida krusei NOT DETECTED NOT DETECTED Final   Candida parapsilosis NOT DETECTED NOT DETECTED Final   Candida tropicalis NOT DETECTED NOT DETECTED Final    Comment: Performed at [facility], [place] [address], [place], [place] [address]  Blood Culture (routine x 2)     Status: None (Preliminary result)   Collection Time: 06/30/20  5:02 AM   Specimen: BLOOD RIGHT ARM  Result Value Ref Range Status   Specimen Description BLOOD RIGHT ARM  Final   Special Requests   Final    BOTTLES DRAWN AEROBIC ONLY Blood Culture results may not be optimal due to an inadequate volume of blood received in culture bottles   Culture   Final    NO GROWTH 1 DAY Performed at [facility], [address], [place], [place] [address]    Report Status PENDING  Incomplete  MRSA PCR Screening     Status: None   Collection Time: 06/30/20  9:08 AM   Specimen: Nasal Mucosa; Nasopharyngeal  Result Value Ref Range Status   MRSA by PCR NEGATIVE NEGATIVE Final    Comment:          The GeneXpert MRSA Assay (FDA approved for NASAL specimens only), is one component of a comprehensive MRSA colonization surveillance program. It is not intended to diagnose MRSA infection nor to guide or monitor treatment for MRSA infections. Performed at [facility], [address], [place], [place] [address]     Radiology Reports DG Chest [place] 1 View  Result Date: 06/30/2020 CLINICAL DATA:  Fever EXAM: PORTABLE CHEST 1 VIEW COMPARISON:  05/11/2019 FINDINGS: The heart size and mediastinal contours are within normal limits. Both lungs are clear. The visualized skeletal structures are unremarkable. IMPRESSION: No active disease. Electronically Signed   By: Ulyses Jarred M.D.   On: 06/30/2020 03:55     CBC Recent Labs  Lab 06/30/20 0328 07/01/20 0529  WBC 16.6* 14.3*  HGB 12.2 10.4*  HCT 37.6 32.4*  PLT 209 190  MCV 99.2 100.3*  MCH 32.2 32.2  MCHC 32.4 32.1  RDW 13.4 13.7  LYMPHSABS 0.6* 1.0  MONOABS 0.5 0.4  EOSABS 0.0 0.1  BASOSABS 0.0 0.0    Chemistries  Recent Labs  Lab 06/30/20 0328 07/01/20 0529  NA 134* 136  K 5.4* 4.9  CL 96* 97*  CO2 22 23  GLUCOSE 237* 88  BUN 63* 38*  CREATININE 11.11* 7.43*  CALCIUM 9.0 9.2  AST 16 12*  ALT 13 11  ALKPHOS 270* 199*  BILITOT 0.5 0.5   ------------------------------------------------------------------------------------------------------------------ Recent Labs    06/30/20 0328  TRIG 136    Lab Results  Component Value Date   HGBA1C 7.2 (H) 06/30/2020   ------------------------------------------------------------------------------------------------------------------ No results for input(s): TSH, T4TOTAL, T3FREE, THYROIDAB in the last 72 hours.  Invalid  input(s): FREET3 ------------------------------------------------------------------------------------------------------------------ Recent Labs    06/30/20 0329  FERRITIN 865*    Coagulation profile Recent Labs  Lab 06/30/20 0328  INR 1.1     Recent Labs    06/30/20 0328  DDIMER 0.95*    Cardiac Enzymes No results for input(s): CKMB, TROPONINI, MYOGLOBIN in the last 168 hours.  Invalid input(s): CK ------------------------------------------------------------------------------------------------------------------    Component Value Date/Time   BNP 85.0 06/30/2020 0328     Roxan Hockey M.D on 07/01/2020 at 7:37 PM  Go to [email or web address] - for contact info  [facility] - Office  [phone]

## 2020-07-01 NOTE — Progress Notes (Signed)
Patient ID: [name], female   DOB: 30-Apr-1966, 54 y.o.   MRN: [id number] S: Feels "rough" this morning. O:BP (!) 156/76 (BP Location: Right Arm)   Pulse 94   Temp 98.6 F (37 C) (Oral)   Resp 18   Ht 5' 5" (1.651 m)   Wt (!) 138.7 kg   SpO2 97%   BMI 50.88 kg/m   Intake/Output Summary (Last 24 hours) at 07/01/2020 1011 Last data filed at 07/01/2020 0900 Gross per 24 hour  Intake 763.69 ml  Output 4000 ml  Net -3236.31 ml   Intake/Output: I/O last 3 completed shifts: In: 523.7 [P.O.:120; IV Piggyback:403.7] Out: 4000 [Other:4000]  Intake/Output this shift:  Total I/O In: 240 [P.O.:240] Out: -  Weight change: 7 kg Gen: NAD, obese AAF lying in bed CVS: RRR, no rub Resp: diminished BS  GYJ:EHUDJ, +BS, soft, NT/ND Ext: chronic lymphedema of bilateral lower extremities L>R, L forearm avf +T/B  Recent Labs  Lab 06/30/20 0328 07/01/20 0529  NA 134* 136  K 5.4* 4.9  CL 96* 97*  CO2 22 23  GLUCOSE 237* 88  BUN 63* 38*  CREATININE 11.11* 7.43*  ALBUMIN 4.0 3.3*  CALCIUM 9.0 9.2  AST 16 12*  ALT 13 11   Liver Function Tests: Recent Labs  Lab 06/30/20 0328 07/01/20 0529  AST 16 12*  ALT 13 11  ALKPHOS 270* 199*  BILITOT 0.5 0.5  PROT 7.9 6.7  ALBUMIN 4.0 3.3*   No results for input(s): LIPASE, AMYLASE in the last 168 hours. No results for input(s): AMMONIA in the last 168 hours. CBC: Recent Labs  Lab 06/30/20 0328 07/01/20 0529  WBC 16.6* 14.3*  NEUTROABS 15.3* 12.7*  HGB 12.2 10.4*  HCT 37.6 32.4*  MCV 99.2 100.3*  PLT 209 190   Cardiac Enzymes: No results for input(s): CKTOTAL, CKMB, CKMBINDEX, TROPONINI in the last 168 hours. CBG: Recent Labs  Lab 06/30/20 0830 06/30/20 1158 06/30/20 1638 06/30/20 2136 07/01/20 0730  GLUCAP 230* 189* 85 117* 101*    Iron Studies:  Recent Labs    06/30/20 0329  FERRITIN 865*   Studies/Results: DG Chest Port 1 View  Result Date: 06/30/2020 CLINICAL DATA:  Fever EXAM: PORTABLE CHEST 1 VIEW  COMPARISON:  05/11/2019 FINDINGS: The heart size and mediastinal contours are within normal limits. Both lungs are clear. The visualized skeletal structures are unremarkable. IMPRESSION: No active disease. Electronically Signed   By: Ulyses Jarred M.D.   On: 06/30/2020 03:55   . enoxaparin (LOVENOX) injection  30 mg Subcutaneous Q24H  . insulin aspart  0-9 Units Subcutaneous TID WC    BMET    Component Value Date/Time   NA 136 07/01/2020 0529   K 4.9 07/01/2020 0529   CL 97 (L) 07/01/2020 0529   CO2 23 07/01/2020 0529   GLUCOSE 88 07/01/2020 0529   BUN 38 (H) 07/01/2020 0529   CREATININE 7.43 (H) 07/01/2020 0529   CALCIUM 9.2 07/01/2020 0529   CALCIUM 8.5 09/06/2012 1056   GFRNONAA 6 (L) 07/01/2020 0529   GFRAA 7 (L) 07/01/2020 0529   CBC    Component Value Date/Time   WBC 14.3 (H) 07/01/2020 0529   RBC 3.23 (L) 07/01/2020 0529   HGB 10.4 (L) 07/01/2020 0529   HCT 32.4 (L) 07/01/2020 0529   PLT 190 07/01/2020 0529   MCV 100.3 (H) 07/01/2020 0529   MCH 32.2 07/01/2020 0529   MCHC 32.1 07/01/2020 0529   RDW 13.7 07/01/2020 0529   LYMPHSABS  1.0 07/01/2020 0529   MONOABS 0.4 07/01/2020 0529   EOSABS 0.1 07/01/2020 0529   BASOSABS 0.0 07/01/2020 0529   Dialysis prescription:  [facility] on MWF schedule. EDW 135.5kg, 2K/2.5 Ca, BFR 350, DFR 600, Time 4:15 Heparin 1,000 bolus and then 1,000/hr epo- 1600 units per treatment venofer 50 mg IV qweek.  Assessment/Plan:  1. Febrile illness with bacteremia- on cefepime and linezolid (due to vanc allergy) per primary svc.  She thinks it came from an ulcer behind her left knee from sitting in a wheelchair for too long and "busted" 4 days ago.  Cultures pending 2.  ESRD- cont with HD on MWF schedule 3. HTN- stable.  edw is 135.5 kg and will UF with HD tomorrow. 4. CKD-BMD- continue with outpatient meds 5. Anemia- hgb dropped since admission.  Continue to follow. 6. Chronic lymphedema  Donetta Potts, MD [facility] [phone]

## 2020-07-01 NOTE — Progress Notes (Signed)
Initial Nutrition Assessment  DOCUMENTATION CODES:   Morbid obesity  INTERVENTION:   Nepro Shake po BID, each supplement provides 425 kcal and 19 grams protein  Rena-vite daily   NUTRITION DIAGNOSIS:   Increased nutrient needs related to chronic illness (ESRD on HD) as evidenced by increased estimated needs  GOAL:   Patient will meet greater than or equal to 90% of their needs  MONITOR:   PO intake, Supplement acceptance, Labs, Weight trends, Skin, I & O's  REASON FOR ASSESSMENT:   Malnutrition Screening Tool    ASSESSMENT:   54 y.o. female with medical history significant of anemia, anxiety, asthma/COPD, ESRD on hemodialysis, cellulitis, thyroid cancer, CAD, chronic diastolic heart failure, Chiari malformation s/p surgery, chronic pain, type II DM, fibromyalgia, essential hypertension, hyperlipidemia, lymphedema, morbid obesity, obesity hypoventilation syndrome, sleep apnea, pneumonia, peritonitis, pulmonary hypertension who is admitted with sepsis   RD working remotely.  Spoke with pt via phone. Pt reports decreased appetite and oral intake for the past 5 days. Pt reports that her taste is off lately and that she has continued to eat poorly in hospital. Pt documented to have eaten 75% of her breakfast this morning. Pt is willing to drink butter pecan Nepro to help her to meet her estimated needs. RD will add supplements and rena-vite to replace losses from HD. Pt reports significant weight loss several years ago; per chart, pt lost ~60lbs around 2019 but has remained weight stable for the past year.   Medications reviewed and include: darbepoetin, lovenox, insulin, cefepime, metronidazole   Labs reviewed: BUN 38(H), creat 7.43(H) alk phos 199(H) Wbc- 14.3(H), Hgb 10.4(L), Hct 32.4(L) cbgs- 101, 186 x 24 hrs AIC 7.2(H)- 7/12  NUTRITION - FOCUSED PHYSICAL EXAM: Unable to perform at this time   Diet Order:   Diet Order            Diet renal/carb modified with fluid  restriction Diet-HS Snack? Nothing; Fluid restriction: 1200 mL Fluid; Room service appropriate? Yes; Fluid consistency: Thin  Diet effective now                EDUCATION NEEDS:   Education needs have been addressed  Skin:  Skin Assessment: Reviewed RN Assessment  Last BM:  7/12  Height:   Ht Readings from Last 1 Encounters:  06/30/20 5' 5" (1.651 m)    Weight:   Wt Readings from Last 1 Encounters:  06/30/20 (!) 138.7 kg    Ideal Body Weight:  56.8 kg  BMI:  Body mass index is 50.88 kg/m.  Estimated Nutritional Needs:   Kcal:  2500-2800kcal/day  Protein:  125-140g/day  Fluid:  UOP +1L  Lindsay Distance MS, RD, LDN Please refer to [facility] for RD and/or RD on-call/weekend/after hours pager

## 2020-07-01 NOTE — Progress Notes (Addendum)
Pharmacy Antibiotic Note  [name] is a 54 y.o. female admitted on 06/30/2020 with unknown source/ bacteremia.  Pharmacy has been consulted for linezolid and cefepime dosing. BCID is + for CONS in 1 bottle. PCT elevated 5.09 Notified MD and wanted to start linezolid(Vancomycin allergy). Patient does have a wound on left leg that has been draining. Still febrile at 101.2  Plan: Continue Cefepime 2g IV on HD days Linezolid 633m IV q12h F/U cxs and clinical progress Monitor V/S, labs  Height: 5' 5" (165.1 cm) Weight: (!) 138.7 kg (305 lb 12.5 oz) IBW/kg (Calculated) : 57  Temp (24hrs), Avg:99.1 F (37.3 C), Min:98.3 F (36.8 C), Max:101.2 F (38.4 C)  Recent Labs  Lab 06/30/20 0328 06/30/20 0502 07/01/20 0529  WBC 16.6*  --  14.3*  CREATININE 11.11*  --  7.43*  LATICACIDVEN 2.3* 1.8  --     Estimated Creatinine Clearance: 12.4 mL/min (A) (by C-G formula based on SCr of 7.43 mg/dL (H)).    Allergies  Allergen Reactions  . Contrast Media [Iodinated Diagnostic Agents] Anaphylaxis, Hives, Swelling and Other (See Comments)    Dye for cardiac cath. Tongue swells  . Pneumococcal Vaccines Swelling and Other (See Comments)    Turns skin black, and bodily swelling  . Vancomycin Nausea And Vomiting and Other (See Comments)    Infusion "made me feel like I was dying" had to be readmitted to hospital    Antimicrobials this admission: linezolid 7/13>>  Cefepime 7/12>>  Metronidazole 7/12>. Clindamycin 7/12 x 1 dose Dose adjustments this admission: prn  Microbiology results: 7/12 BCx: CONS in 1 bottle, possible contaminant 7/12 MRSA PCR: negative  Thank you for allowing pharmacy to be a part of this patient's care.  LIsac Sarna BS Pharm D, BCPS Clinical Pharmacist Pager #[phone]/13/2021 8:20 AM

## 2020-07-01 NOTE — Plan of Care (Signed)

## 2020-07-02 DIAGNOSIS — A419 Sepsis, unspecified organism: Secondary | ICD-10-CM | POA: Diagnosis not present

## 2020-07-02 DIAGNOSIS — A040 Enteropathogenic Escherichia coli infection: Secondary | ICD-10-CM | POA: Diagnosis present

## 2020-07-02 LAB — RENAL FUNCTION PANEL
Albumin: 3.5 g/dL (ref 3.5–5.0)
Anion gap: 15 (ref 5–15)
BUN: 52 mg/dL — ABNORMAL HIGH (ref 6–20)
CO2: 27 mmol/L (ref 22–32)
Calcium: 9.3 mg/dL (ref 8.9–10.3)
Chloride: 93 mmol/L — ABNORMAL LOW (ref 98–111)
Creatinine, Ser: 9.23 mg/dL — ABNORMAL HIGH (ref 0.44–1.00)
GFR calc Af Amer: 5 mL/min — ABNORMAL LOW (ref >60)
GFR calc non Af Amer: 4 mL/min — ABNORMAL LOW (ref >60)
Glucose, Bld: 137 mg/dL — ABNORMAL HIGH (ref 70–99)
Phosphorus: 6.5 mg/dL — ABNORMAL HIGH (ref 2.5–4.6)
Potassium: 4.5 mmol/L (ref 3.5–5.1)
Sodium: 135 mmol/L (ref 135–145)

## 2020-07-02 LAB — GASTROINTESTINAL PANEL BY PCR, STOOL (REPLACES STOOL CULTURE)

## 2020-07-02 LAB — CBC WITH DIFFERENTIAL/PLATELET
Abs Immature Granulocytes: 0.05 10*3/uL (ref 0.00–0.07)
Basophils Absolute: 0 10*3/uL (ref 0.0–0.1)
Basophils Relative: 0 %
Eosinophils Absolute: 0.3 10*3/uL (ref 0.0–0.5)
Eosinophils Relative: 3 %
HCT: 33.4 % — ABNORMAL LOW (ref 36.0–46.0)
Hemoglobin: 10.7 g/dL — ABNORMAL LOW (ref 12.0–15.0)
Immature Granulocytes: 1 %
Lymphocytes Relative: 14 %
Lymphs Abs: 1.3 10*3/uL (ref 0.7–4.0)
MCH: 32.1 pg (ref 26.0–34.0)
MCHC: 32 g/dL (ref 30.0–36.0)
MCV: 100.3 fL — ABNORMAL HIGH (ref 80.0–100.0)
Monocytes Absolute: 0.6 10*3/uL (ref 0.1–1.0)
Monocytes Relative: 6 %
Neutro Abs: 7.2 10*3/uL (ref 1.7–7.7)
Neutrophils Relative %: 76 %
Platelets: 184 10*3/uL (ref 150–400)
RBC: 3.33 MIL/uL — ABNORMAL LOW (ref 3.87–5.11)
RDW: 13.5 % (ref 11.5–15.5)
WBC: 9.4 10*3/uL (ref 4.0–10.5)
nRBC: 0 % (ref 0.0–0.2)

## 2020-07-02 LAB — GLUCOSE, CAPILLARY
Glucose-Capillary: 132 mg/dL — ABNORMAL HIGH (ref 70–99)
Glucose-Capillary: 121 mg/dL — ABNORMAL HIGH (ref 70–99)
Glucose-Capillary: 167 mg/dL — ABNORMAL HIGH (ref 70–99)
Glucose-Capillary: 124 mg/dL — ABNORMAL HIGH (ref 70–99)

## 2020-07-02 LAB — HEPATITIS B SURFACE ANTIGEN: Hepatitis B Surface Ag: NONREACTIVE

## 2020-07-02 MED ORDER — HEPARIN SODIUM (PORCINE) 1000 UNIT/ML DIALYSIS
20.0000 [IU]/kg | INTRAMUSCULAR | Status: DC | PRN
  Filled 2020-07-02: qty 3

## 2020-07-02 MED ORDER — LANTHANUM CARBONATE 500 MG PO CHEW
1500.0000 mg | CHEWABLE_TABLET | Freq: Three times a day (TID) | ORAL | Status: DC
  Administered 2020-07-02 – 2020-07-04 (×6): 1500 mg via ORAL
  Filled 2020-07-02 (×16): qty 3

## 2020-07-02 MED ORDER — SODIUM CHLORIDE 0.9 % IV SOLN
2.0000 g | INTRAVENOUS | Status: DC
  Administered 2020-07-02 – 2020-07-04 (×3): 2 g via INTRAVENOUS
  Filled 2020-07-02 (×3): qty 20

## 2020-07-02 MED ORDER — SODIUM CHLORIDE 0.9 % IV SOLN: 100.0000 mL | INTRAVENOUS | Status: DC | PRN

## 2020-07-02 NOTE — Plan of Care (Signed)

## 2020-07-02 NOTE — Procedures (Signed)
I was present at this dialysis session. I have reviewed the session itself and made appropriate changes.   Vital signs in last 24 hours:  Temp:  [97.9 F (36.6 C)-98.3 F (36.8 C)] 97.9 F (36.6 C) (07/14 1045) Pulse Rate:  [78-86] 83 (07/14 1045) Resp:  [16-18] 16 (07/14 1045) BP: (120-132)/(67-78) 120/67 (07/14 1045) SpO2:  [94 %-100 %] 98 % (07/14 1045) Weight:  [140.6 kg] 140.6 kg (07/14 0444) Weight change: -1.4 kg Filed Weights   06/30/20 1430 06/30/20 1840 07/02/20 0444  Weight: (!) 142 kg (!) 138.7 kg (!) 140.6 kg    Recent Labs  Lab 07/02/20 0609  NA 135  K 4.5  CL 93*  CO2 27  GLUCOSE 137*  BUN 52*  CREATININE 9.23*  CALCIUM 9.3  PHOS 6.5*    Recent Labs  Lab 06/30/20 0328 07/01/20 0529 07/02/20 0609  WBC 16.6* 14.3* 9.4  NEUTROABS 15.3* 12.7* 7.2  HGB 12.2 10.4* 10.7*  HCT 37.6 32.4* 33.4*  MCV 99.2 100.3* 100.3*  PLT 209 190 184    Scheduled Meds: . enoxaparin (LOVENOX) injection  30 mg Subcutaneous Q24H  . feeding supplement (NEPRO CARB STEADY)  237 mL Oral BID BM  . insulin aspart  0-9 Units Subcutaneous TID WC  . lanthanum  1,500 mg Oral TID WC  . multivitamin  1 tablet Oral QHS   Continuous Infusions: . ceFEPime (MAXIPIME) IV 2 g (06/30/20 2020)  . linezolid (ZYVOX) IV 600 mg (07/01/20 2105)  . metronidazole 500 mg (07/02/20 0503)   PRN Meds:.acetaminophen **OR** acetaminophen, acetaminophen, HYDROcodone-acetaminophen, ondansetron **OR** ondansetron (ZOFRAN) IV   Donetta Potts,  MD 07/02/2020, 11:35 AM

## 2020-07-02 NOTE — Progress Notes (Signed)
Patient Demographics:    [name], is a 54 y.o. female, DOB - 17-Jul-1966, BOF:[id number]  Admit date - 06/30/2020   Admitting Physician Sury Wentworth Denton Brick, MD  Outpatient Primary MD for the patient is [name], MD  LOS - 1   Chief Complaint  Patient presents with  . Fever        Subjective:    [name] today has   no emesis,  No chest pain,    -No further fevers, no  chills Frequency and consistency of stools is improving   Assessment  & Plan :    Principal Problem:   Sepsis due to undetermined organism [facility]) Active Problems:   Hyperkalemia   HTN (hypertension)   Chronic diastolic CHF (congestive heart failure) (HCC)   ESRD on dialysis (HCC)   Hyperlipidemia, unspecified   Lymphedema, not elsewhere classified   Morbid (severe) obesity due to excess calories ([place])   Type 2 diabetes mellitus with diabetic nephropathy ([place])   Bacteremia  Brief Summary:- 54 y.o. female with medical history significant of anemia, anxiety, asthma/COPD, ESRD on hemodialysis, cellulitis, thyroid cancer, CAD, chronic diastolic heart failure, Chiari malformation s/p surgery, chronic pain, type II DM, fibromyalgia,HLD, HTN, lymphedema, morbid obesity, obesity hypoventilation syndrome, sleep apnea,  pulmonary hypertension admitted on 06/30/2020 with sepsis   A/p 1)Bacteremia- Now afebrile, BCID is + for CONS in 1 bottle. PCT elevated 5.09, -Treated with linezolid(Vancomycin allergy).  -Okay to de-escalate to Rocephin which should also cover E. coli in stool --Stop linezolid and cefepime  -Stop IV Flagyl -Leukocytosis is resolved -Continue Flagyl due to diarrhea -WBC down to 14.3 from 16.6 -On admission patient had fevers, tachycardia, tachypnea--- patient met SIRS criteria, no evidence of significant end-organ dysfunction per se at this time, patient does NOT meet sepsis criteria at this  time -Positive blood culture may be a contaminant  2)Diarrhea secondary to enteropathogenic E. coli --- -treat as above #1  3)ESRD-continue HD Monday Wednesdays and Fridays, nephrology consult appreciated -Tolerated HD well on 07/02/2020  4)HTN-hold amlodipine and midodrine  5) chronic anemia of CKD--- ESA/Procrit per nephrology team  6)Chronic lymphedema--- supportive care  7)HFpEF--stable, appears euvolemic, continue to use HD to address volume status  8)DM2--Use Novolog/Humalog Sliding scale insulin with Accu-Cheks/Fingersticks as ordered   9)Depression and anxiety--- on Zoloft and alprazolam  Disposition/Need for in-Hospital Stay- patient unable to be discharged at this time due to --- bacteremia with SIRS  requiring IV antibiotics pending further culture data --Possible discharge home on 07/03/2020 on Omnicef if diarrhea continues to improve  Status is: Inpatient  Remains inpatient appropriate because:bacteremia with persistent fevers requiring IV antibiotics pending further culture data  Disposition: The patient is from: Home              Anticipated d/c is to: Home              Anticipated d/c date is: 2 days              Patient currently is not medically stable to d/c. Barriers: Not Clinically Stable- -bacteremia with SIRS  requiring IV antibiotics pending further culture data --Possible discharge home on 07/03/2020 on Omnicef if diarrhea continues to improve  Code Status : Full  Family Communication:    (  patient is alert, awake and coherent)  Consults  :  Nephrology  DVT Prophylaxis  :  Lovenox -   Lab Results  Component Value Date   PLT 184 07/02/2020   Inpatient Medications  Scheduled Meds: . enoxaparin (LOVENOX) injection  30 mg Subcutaneous Q24H  . feeding supplement (NEPRO CARB STEADY)  237 mL Oral BID BM  . insulin aspart  0-9 Units Subcutaneous TID WC  . lanthanum  1,500 mg Oral TID WC  . multivitamin  1 tablet Oral QHS   Continuous Infusions: .  sodium chloride    . sodium chloride    . ceFEPime (MAXIPIME) IV 2 g (06/30/20 2020)  . linezolid (ZYVOX) IV 600 mg (07/02/20 1556)  . metronidazole 500 mg (07/02/20 0503)   PRN Meds:.sodium chloride, sodium chloride, acetaminophen **OR** acetaminophen, acetaminophen, heparin, HYDROcodone-acetaminophen, ondansetron **OR** ondansetron (ZOFRAN) IV   Anti-infectives (From admission, onward)   Start     Dose/Rate Route Frequency Ordered Stop   07/01/20 1000  linezolid (ZYVOX) IVPB 600 mg     Discontinue     600 mg 300 mL/hr over 60 Minutes Intravenous Every 12 hours 07/01/20 0801     06/30/20 2000  ceFEPIme (MAXIPIME) 2 g in sodium chloride 0.9 % 100 mL IVPB     Discontinue     2 g 200 mL/hr over 30 Minutes Intravenous Every M-W-F (2000) 06/30/20 0515     06/30/20 1200  metroNIDAZOLE (FLAGYL) IVPB 500 mg     Discontinue     500 mg 100 mL/hr over 60 Minutes Intravenous Every 8 hours 06/30/20 0511     06/30/20 0515  ceFEPIme (MAXIPIME) 2 g in sodium chloride 0.9 % 100 mL IVPB  Status:  Discontinued        2 g 200 mL/hr over 30 Minutes Intravenous  Once 06/30/20 0511 06/30/20 0512   06/30/20 0345  ceFEPIme (MAXIPIME) 2 g in sodium chloride 0.9 % 100 mL IVPB        2 g 200 mL/hr over 30 Minutes Intravenous  Once 06/30/20 0343 06/30/20 0504   06/30/20 0345  metroNIDAZOLE (FLAGYL) IVPB 500 mg        500 mg 100 mL/hr over 60 Minutes Intravenous  Once 06/30/20 0343 06/30/20 0523   06/30/20 0345  clindamycin (CLEOCIN) IVPB 900 mg        900 mg 100 mL/hr over 30 Minutes Intravenous  Once 06/30/20 0343 06/30/20 0504        Objective:   Vitals:   07/02/20 1410 07/02/20 1420 07/02/20 1440 07/02/20 1544  BP: 102/60 98/68 100/67 96/65  Pulse: 82 84 85 88  Resp:    16  Temp:    98.2 F (36.8 C)  TempSrc:    Oral  SpO2:    97%  Weight:      Height:        Wt Readings from Last 3 Encounters:  07/02/20 (!) 141.6 kg  10/18/19 134 kg  05/11/19 (!) 141.6 kg     Intake/Output Summary  (Last 24 hours) at 07/02/2020 1558 Last data filed at 07/01/2020 1700 Gross per 24 hour  Intake 240 ml  Output --  Net 240 ml   Physical Exam  Gen:- Awake Alert, morbidly obese HEENT:- [place].AT, No sclera icterus Neck-Supple Neck,No JVD,.  Lungs-  CTAB , fair symmetrical air movement CV- S1, S2 normal, regular  Abd-  +ve B.Sounds, Abd Soft, No tenderness, increased truncal adiposity Extremity/Skin:-Lymphedema, pedal pulses present  Psych-affect is appropriate, oriented x3  Neuro-generalized weakness ,no new focal deficits, no tremors MSK-left arm AV fistula with positive thrill and bruit   Data Review:   Micro Results Recent Results (from the past 240 hour(s))  SARS Coronavirus 2 by RT PCR (hospital order, performed in [facility] hospital lab) Nasopharyngeal Nasopharyngeal Swab     Status: None   Collection Time: 06/30/20  3:36 AM   Specimen: Nasopharyngeal Swab  Result Value Ref Range Status   SARS Coronavirus 2 NEGATIVE NEGATIVE Final    Comment: (NOTE) SARS-CoV-2 target nucleic acids are NOT DETECTED.  The SARS-CoV-2 RNA is generally detectable in upper and lower respiratory specimens during the acute phase of infection. The lowest concentration of SARS-CoV-2 viral copies this assay can detect is 250 copies / mL. A negative result does not preclude SARS-CoV-2 infection and should not be used as the sole basis for treatment or other patient management decisions.  A negative result may occur with improper specimen collection / handling, submission of specimen other than nasopharyngeal swab, presence of viral mutation(s) within the areas targeted by this assay, and inadequate number of viral copies (<250 copies / mL). A negative result must be combined with clinical observations, patient history, and epidemiological information.  Fact Sheet for Patients:   StrictlyIdeas.no  Fact Sheet for Healthcare  Providers: BankingDealers.co.za  This test is not yet approved or  cleared by the [place] FDA and has been authorized for detection and/or diagnosis of SARS-CoV-2 by FDA under an Emergency Use Authorization (EUA).  This EUA will remain in effect (meaning this test can be used) for the duration of the COVID-19 declaration under Section 564(b)(1) of the Act, 21 U.S.C. section 360bbb-3(b)(1), unless the authorization is terminated or revoked sooner.  Performed at [facility], [address] [place], [place] [address]   Blood Culture (routine x 2)     Status: Abnormal (Preliminary result)   Collection Time: 06/30/20  3:58 AM   Specimen: BLOOD RIGHT ARM  Result Value Ref Range Status   Specimen Description   Final    BLOOD RIGHT ARM Performed at [facility], [address], [place], [place] [address]    Special Requests   Final    BOTTLES DRAWN AEROBIC AND ANAEROBIC Blood Culture results may not be optimal due to an inadequate volume of blood received in culture bottles Performed at [facility], [address], [place], [place] [address]    Culture  Setup Time   Final    IN BOTH AEROBIC AND ANAEROBIC BOTTLES GRAM POSITIVE COCCI IN CLUSTERS Gram Stain Report Called to,Read Back By and Verified With: J RHUE,RN _0  07/01/20 MKELLY CRITICAL RESULT CALLED TO, READ BACK BY AND VERIFIED WITH: RN EMILY WRIGHT [id number] [phone] FCP    Culture (A)  Final    STAPHYLOCOCCUS SPECIES (COAGULASE NEGATIVE) THE SIGNIFICANCE OF ISOLATING THIS ORGANISM FROM A SINGLE SET OF BLOOD CULTURES WHEN MULTIPLE SETS ARE DRAWN IS UNCERTAIN. PLEASE NOTIFY THE MICROBIOLOGY DEPARTMENT WITHIN ONE WEEK IF SPECIATION AND SENSITIVITIES ARE REQUIRED. Performed at [facility], [place] [address] [place], [place] [address]    Report Status PENDING  Incomplete  Blood Culture ID Panel (Reflexed)     Status: Abnormal   Collection Time: 06/30/20  3:58 AM  Result Value Ref Range Status    Enterococcus species NOT DETECTED NOT DETECTED Final   Listeria monocytogenes NOT DETECTED NOT DETECTED Final   Staphylococcus species DETECTED (A) NOT DETECTED Final    Comment: Methicillin (oxacillin) susceptible coagulase negative staphylococcus. Possible  blood culture contaminant (unless isolated from more than one blood culture draw or clinical case suggests pathogenicity). No antibiotic treatment is indicated for blood  culture contaminants. CRITICAL RESULT CALLED TO, READ BACK BY AND VERIFIED WITH: RN Deirdre Pippins [phone] FCP    Staphylococcus aureus (BCID) NOT DETECTED NOT DETECTED Final   Methicillin resistance NOT DETECTED NOT DETECTED Final   Streptococcus species NOT DETECTED NOT DETECTED Final   Streptococcus agalactiae NOT DETECTED NOT DETECTED Final   Streptococcus pneumoniae NOT DETECTED NOT DETECTED Final   Streptococcus pyogenes NOT DETECTED NOT DETECTED Final   Acinetobacter baumannii NOT DETECTED NOT DETECTED Final   Enterobacteriaceae species NOT DETECTED NOT DETECTED Final   Enterobacter cloacae complex NOT DETECTED NOT DETECTED Final   Escherichia coli NOT DETECTED NOT DETECTED Final   Klebsiella oxytoca NOT DETECTED NOT DETECTED Final   Klebsiella pneumoniae NOT DETECTED NOT DETECTED Final   Proteus species NOT DETECTED NOT DETECTED Final   Serratia marcescens NOT DETECTED NOT DETECTED Final   Haemophilus influenzae NOT DETECTED NOT DETECTED Final   Neisseria meningitidis NOT DETECTED NOT DETECTED Final   Pseudomonas aeruginosa NOT DETECTED NOT DETECTED Final   Candida albicans NOT DETECTED NOT DETECTED Final   Candida glabrata NOT DETECTED NOT DETECTED Final   Candida krusei NOT DETECTED NOT DETECTED Final   Candida parapsilosis NOT DETECTED NOT DETECTED Final   Candida tropicalis NOT DETECTED NOT DETECTED Final    Comment: Performed at [facility], [place]. [address], [place], [place] [address]  Blood Culture (routine x 2)     Status: None (Preliminary  result)   Collection Time: 06/30/20  5:02 AM   Specimen: BLOOD RIGHT ARM  Result Value Ref Range Status   Specimen Description BLOOD RIGHT ARM  Final   Special Requests   Final    BOTTLES DRAWN AEROBIC ONLY Blood Culture results may not be optimal due to an inadequate volume of blood received in culture bottles   Culture   Final    NO GROWTH 2 DAYS Performed at [facility], [address] [place], [place] [address]    Report Status PENDING  Incomplete  MRSA PCR Screening     Status: None   Collection Time: 06/30/20  9:08 AM   Specimen: Nasal Mucosa; Nasopharyngeal  Result Value Ref Range Status   MRSA by PCR NEGATIVE NEGATIVE Final    Comment:        The GeneXpert MRSA Assay (FDA approved for NASAL specimens only), is one component of a comprehensive MRSA colonization surveillance program. It is not intended to diagnose MRSA infection nor to guide or monitor treatment for MRSA infections. Performed at [facility], [address], [place], [place] [address]   C Difficile Quick Screen w PCR reflex     Status: None   Collection Time: 07/01/20 10:49 PM   Specimen: Stool  Result Value Ref Range Status   C Diff antigen NEGATIVE NEGATIVE Final   C Diff toxin NEGATIVE NEGATIVE Final   C Diff interpretation No C. difficile detected.  Final    Comment: Performed at [facility], [address], [place], [place] [address]  Gastrointestinal Panel by PCR , Stool     Status: Abnormal   Collection Time: 07/01/20 10:49 PM   Specimen: Stool  Result Value Ref Range Status   Campylobacter species NOT DETECTED NOT DETECTED Final   Plesimonas shigelloides NOT DETECTED NOT DETECTED Final   Salmonella species NOT DETECTED NOT DETECTED Final   Yersinia  enterocolitica NOT DETECTED NOT DETECTED Final   Vibrio species NOT DETECTED NOT DETECTED Final   Vibrio cholerae NOT DETECTED NOT DETECTED Final   Enteroaggregative E coli (EAEC) NOT DETECTED NOT DETECTED Final   Enteropathogenic E coli (EPEC)  DETECTED (A) NOT DETECTED Final    Comment: CRITICAL RESULT CALLED TO, READ BACK BY AND VERIFIED WITH: EMILY WRIGHT 07/02/20 1511 KLW    Enterotoxigenic E coli (ETEC) NOT DETECTED NOT DETECTED Final   Shiga like toxin producing E coli (STEC) NOT DETECTED NOT DETECTED Final   Shigella/Enteroinvasive E coli (EIEC) NOT DETECTED NOT DETECTED Final   Cryptosporidium NOT DETECTED NOT DETECTED Final   Cyclospora cayetanensis NOT DETECTED NOT DETECTED Final   Entamoeba histolytica NOT DETECTED NOT DETECTED Final   Giardia lamblia NOT DETECTED NOT DETECTED Final   Adenovirus F40/41 NOT DETECTED NOT DETECTED Final   Astrovirus NOT DETECTED NOT DETECTED Final   Norovirus GI/GII NOT DETECTED NOT DETECTED Final   Rotavirus A NOT DETECTED NOT DETECTED Final   Sapovirus (I, II, IV, and V) NOT DETECTED NOT DETECTED Final    Comment: Performed at [facility], [address] [place], [place] [address]    Radiology Reports DG Chest Port 1 View  Result Date: 06/30/2020 CLINICAL DATA:  Fever EXAM: PORTABLE CHEST 1 VIEW COMPARISON:  05/11/2019 FINDINGS: The heart size and mediastinal contours are within normal limits. Both lungs are clear. The visualized skeletal structures are unremarkable. IMPRESSION: No active disease. Electronically Signed   By: Ulyses Jarred M.D.   On: 06/30/2020 03:55     CBC Recent Labs  Lab 06/30/20 0328 07/01/20 0529 07/02/20 0609  WBC 16.6* 14.3* 9.4  HGB 12.2 10.4* 10.7*  HCT 37.6 32.4* 33.4*  PLT 209 190 184  MCV 99.2 100.3* 100.3*  MCH 32.2 32.2 32.1  MCHC 32.4 32.1 32.0  RDW 13.4 13.7 13.5  LYMPHSABS 0.6* 1.0 1.3  MONOABS 0.5 0.4 0.6  EOSABS 0.0 0.1 0.3  BASOSABS 0.0 0.0 0.0    Chemistries  Recent Labs  Lab 06/30/20 0328 07/01/20 0529 07/02/20 0609  NA 134* 136 135  K 5.4* 4.9 4.5  CL 96* 97* 93*  CO2 _0 GLUCOSE 237* 88 137*  BUN 63* 38* 52*  CREATININE 11.11* 7.43* 9.23*  CALCIUM 9.0 9.2 9.3  AST 16 12*  --   ALT 13 11  --    ALKPHOS 270* 199*  --   BILITOT 0.5 0.5  --    ------------------------------------------------------------------------------------------------------------------ Recent Labs    06/30/20 0328  TRIG 136    Lab Results  Component Value Date   HGBA1C 7.2 (H) 06/30/2020   ------------------------------------------------------------------------------------------------------------------ No results for input(s): TSH, T4TOTAL, T3FREE, THYROIDAB in the last 72 hours.  Invalid input(s): FREET3 ------------------------------------------------------------------------------------------------------------------ Recent Labs    06/30/20 0329  FERRITIN 865*    Coagulation profile Recent Labs  Lab 06/30/20 0328  INR 1.1    Recent Labs    06/30/20 0328  DDIMER 0.95*    Cardiac Enzymes No results for input(s): CKMB, TROPONINI, MYOGLOBIN in the last 168 hours.  Invalid input(s): CK ------------------------------------------------------------------------------------------------------------------    Component Value Date/Time   BNP 85.0 06/30/2020 0328     Roxan Hockey M.D on 07/02/2020 at 3:58 PM  Go to [email or web address] - for contact info  [facility] - Office  [phone]

## 2020-07-02 NOTE — Procedures (Signed)
     HEMODIALYSIS TREATMENT NOTE:   4 hour low-heparin dialysis completed via left forearm AVF (15g/antegrade). Goal not met: Unable to tolerate removal of 6.2L to her EDW.  UF was interrupted x 15 minutes and UF rate was then decreased in response to SBP high 80s (asymptomatic).  Net UF 5011cc.  All blood was returned and hemostasis was achieved in 10 minutes.  No changes from pre-HD assessment.  Rockwell Alexandria, RN

## 2020-07-03 DIAGNOSIS — A419 Sepsis, unspecified organism: Secondary | ICD-10-CM | POA: Diagnosis not present

## 2020-07-03 LAB — CULTURE, BLOOD (ROUTINE X 2)

## 2020-07-03 LAB — CBC WITH DIFFERENTIAL/PLATELET
Abs Immature Granulocytes: 0.07 10*3/uL (ref 0.00–0.07)
Basophils Absolute: 0.1 10*3/uL (ref 0.0–0.1)
Basophils Relative: 1 %
Eosinophils Absolute: 0.3 10*3/uL (ref 0.0–0.5)
Eosinophils Relative: 4 %
HCT: 33.5 % — ABNORMAL LOW (ref 36.0–46.0)
Hemoglobin: 10.6 g/dL — ABNORMAL LOW (ref 12.0–15.0)
Immature Granulocytes: 1 %
Lymphocytes Relative: 13 %
Lymphs Abs: 1 10*3/uL (ref 0.7–4.0)
MCH: 31.7 pg (ref 26.0–34.0)
MCHC: 31.6 g/dL (ref 30.0–36.0)
MCV: 100.3 fL — ABNORMAL HIGH (ref 80.0–100.0)
Monocytes Absolute: 0.6 10*3/uL (ref 0.1–1.0)
Monocytes Relative: 8 %
Neutro Abs: 5.7 10*3/uL (ref 1.7–7.7)
Neutrophils Relative %: 73 %
Platelets: 185 10*3/uL (ref 150–400)
RBC: 3.34 MIL/uL — ABNORMAL LOW (ref 3.87–5.11)
RDW: 13.2 % (ref 11.5–15.5)
WBC: 7.8 10*3/uL (ref 4.0–10.5)
nRBC: 0 % (ref 0.0–0.2)

## 2020-07-03 LAB — GLUCOSE, CAPILLARY
Glucose-Capillary: 143 mg/dL — ABNORMAL HIGH (ref 70–99)
Glucose-Capillary: 157 mg/dL — ABNORMAL HIGH (ref 70–99)
Glucose-Capillary: 147 mg/dL — ABNORMAL HIGH (ref 70–99)

## 2020-07-03 MED ORDER — CHLORHEXIDINE GLUCONATE CLOTH 2 % EX PADS
6.0000 | MEDICATED_PAD | Freq: Every day | CUTANEOUS | Status: DC
  Administered 2020-07-04: 6 via TOPICAL

## 2020-07-03 NOTE — Progress Notes (Signed)
Patient Demographics:    [name], is a 54 y.o. female, DOB - Jun 01, 1966, BJS:[id number]  Admit date - 06/30/2020   Admitting Physician Kees Idrovo Denton Brick, MD  Outpatient Primary MD for the patient is [name], MD  LOS - 2   Chief Complaint  Patient presents with   Fever        Subjective:    [name] today has  No chest pain,    -No further fevers, no  chills Patient with 4 episodes of emesis since morning, -Patient also has episodes of watery stool x3 since morning -Abdominal discomfort -Poor appetite   Assessment  & Plan :    Principal Problem:   Sepsis due to undetermined organism [facility]) Active Problems:   Enteritis, enteropathogenic E. coli   Hyperkalemia   HTN (hypertension)   Chronic diastolic CHF (congestive heart failure) (HCC)   ESRD on dialysis ([place])   Hyperlipidemia, unspecified   Lymphedema, not elsewhere classified   Morbid (severe) obesity due to excess calories ([place])   Type 2 diabetes mellitus with diabetic nephropathy ([place])   Bacteremia  Brief Summary:- 54 y.o. female with medical history significant of anemia, anxiety, asthma/COPD, ESRD on hemodialysis, cellulitis, thyroid cancer, CAD, chronic diastolic heart failure, Chiari malformation s/p surgery, chronic pain, type II DM, fibromyalgia,HLD, HTN, lymphedema, morbid obesity, obesity hypoventilation syndrome, sleep apnea,  pulmonary hypertension admitted on 06/30/2020 with sepsis   A/p 1)Bacteremia- Now afebrile, BCID is + for CONS in 1 bottle. PCT elevated 5.09, -Okay to de-escalate to Rocephin which should also cover E. coli in stool --Stopped linezolid and cefepime  -Stopped IV Flagyl -Leukocytosis has resolved -On admission patient had fevers, tachycardia, tachypnea--- patient met SIRS criteria, no evidence of significant end-organ dysfunction per se at this time, patient does NOT meet sepsis  criteria at this time -Positive blood culture may be a contaminant - 2)Diarrhea secondary to enteropathogenic E. coli --- -treat as above #1 -IV Rocephin started 07/02/2020 -Patient now with persistent diarrhea and vomiting with significant risk for dehydration--- -difficulty with trying to hydrate patient IV as she is a hemodialysis patient  3)ESRD- continue HD Monday Wednesdays and Fridays, nephrology consult appreciated -Tolerated HD well on 07/02/2020  4)HTN-hold Amlodipine and midodrine  5) chronic anemia of CKD--- ESA/Procrit per nephrology team  6)Chronic lymphedema--- supportive care, continue to use hemodialysis to address volume status  7)HFpEF--stable, appears euvolemic, continue to use HD to address volume status  8)DM2--Use Novolog/Humalog Sliding scale insulin with Accu-Cheks/Fingersticks as ordered   9)Depression and anxiety--- on Zoloft and alprazolam  Disposition/Need for in-Hospital Stay- patient unable to be discharged at this time due to --- bacteremia with SIRS  requiring IV antibiotics --Patient now with persistent diarrhea and vomiting with significant risk for dehydration--- -difficulty with trying to hydrate patient IV as she is a hemodialysis patient ---Possible discharge home on 07/04/2020 on Omnicef if diarrhea and vomiting improves  Status is: Inpatient  Remains inpatient appropriate because:bacteremia with persistent fevers requiring IV antibiotics pending further culture data  Disposition: The patient is from: Home              Anticipated d/c is to: Home              Anticipated d/c date is: 2  days              Patient currently is not medically stable to d/c. Barriers: Not Clinically Stable- -bacteremia with SIRS  requiring IV antibiotics pending further culture data --Patient now with persistent diarrhea and vomiting with significant risk for dehydration--- -difficulty with trying to hydrate patient IV as she is a hemodialysis patient ---Possible  discharge home on 07/04/2020 on Omnicef if diarrhea and vomiting improves  Code Status : Full  Family Communication:    (patient is alert, awake and coherent)  Consults  :  Nephrology  DVT Prophylaxis  :  Lovenox -   Lab Results  Component Value Date   PLT 185 07/03/2020   Inpatient Medications  Scheduled Meds:  enoxaparin (LOVENOX) injection  30 mg Subcutaneous Q24H   feeding supplement (NEPRO CARB STEADY)  237 mL Oral BID BM   insulin aspart  0-9 Units Subcutaneous TID WC   lanthanum  1,500 mg Oral TID WC   multivitamin  1 tablet Oral QHS   Continuous Infusions:  sodium chloride     sodium chloride     cefTRIAXone (ROCEPHIN)  IV 2 g (07/02/20 1703)   PRN Meds:.sodium chloride, sodium chloride, acetaminophen **OR** acetaminophen, acetaminophen, heparin, HYDROcodone-acetaminophen, ondansetron **OR** ondansetron (ZOFRAN) IV   Anti-infectives (From admission, onward)   Start     Dose/Rate Route Frequency Ordered Stop   07/02/20 1700  cefTRIAXone (ROCEPHIN) 2 g in sodium chloride 0.9 % 100 mL IVPB     Discontinue     2 g 200 mL/hr over 30 Minutes Intravenous Every 24 hours 07/02/20 1602     07/01/20 1000  linezolid (ZYVOX) IVPB 600 mg  Status:  Discontinued        600 mg 300 mL/hr over 60 Minutes Intravenous Every 12 hours 07/01/20 0801 07/02/20 1601   06/30/20 2000  ceFEPIme (MAXIPIME) 2 g in sodium chloride 0.9 % 100 mL IVPB  Status:  Discontinued        2 g 200 mL/hr over 30 Minutes Intravenous Every M-W-F (2000) 06/30/20 0515 07/02/20 1602   06/30/20 1200  metroNIDAZOLE (FLAGYL) IVPB 500 mg  Status:  Discontinued        500 mg 100 mL/hr over 60 Minutes Intravenous Every 8 hours 06/30/20 0511 07/02/20 1601   06/30/20 0515  ceFEPIme (MAXIPIME) 2 g in sodium chloride 0.9 % 100 mL IVPB  Status:  Discontinued        2 g 200 mL/hr over 30 Minutes Intravenous  Once 06/30/20 0511 06/30/20 0512   06/30/20 0345  ceFEPIme (MAXIPIME) 2 g in sodium chloride 0.9 % 100 mL IVPB         2 g 200 mL/hr over 30 Minutes Intravenous  Once 06/30/20 0343 06/30/20 0504   06/30/20 0345  metroNIDAZOLE (FLAGYL) IVPB 500 mg        500 mg 100 mL/hr over 60 Minutes Intravenous  Once 06/30/20 0343 06/30/20 0523   06/30/20 0345  clindamycin (CLEOCIN) IVPB 900 mg        900 mg 100 mL/hr over 30 Minutes Intravenous  Once 06/30/20 0343 06/30/20 0504        Objective:   Vitals:   07/02/20 1544 07/02/20 2056 07/02/20 2058 07/03/20 0559  BP: 96/65 (!) 93/57  111/61  Pulse: 88 90 90 91  Resp: _0 Temp: 98.2 F (36.8 C) 98.3 F (36.8 C)  98.3 F (36.8 C)  TempSrc: Oral Oral  Oral  SpO2: 97% Marland Kitchen)  88% (!) 89% 97%  Weight:      Height:        Wt Readings from Last 3 Encounters:  07/02/20 (!) 136.4 kg  10/18/19 134 kg  05/11/19 (!) 141.6 kg     Intake/Output Summary (Last 24 hours) at 07/03/2020 1117 Last data filed at 07/02/2020 1730 Gross per 24 hour  Intake 240 ml  Output 5011 ml  Net -4771 ml   Physical Exam  Gen:- Awake Alert, morbidly obese HEENT:- [place].AT, No sclera icterus Neck-Supple Neck,No JVD,.  Lungs-  CTAB , fair symmetrical air movement CV- S1, S2 normal, regular  Abd-  +ve B.Sounds, Abd Soft, No tenderness, increased truncal adiposity Extremity/Skin:-Lymphedema, pedal pulses present  Psych-affect is appropriate, oriented x3 Neuro-generalized weakness ,no new focal deficits, no tremors MSK-left arm AV fistula with positive thrill and bruit   Data Review:   Micro Results Recent Results (from the past 240 hour(s))  SARS Coronavirus 2 by RT PCR (hospital order, performed in [facility] hospital lab) Nasopharyngeal Nasopharyngeal Swab     Status: None   Collection Time: 06/30/20  3:36 AM   Specimen: Nasopharyngeal Swab  Result Value Ref Range Status   SARS Coronavirus 2 NEGATIVE NEGATIVE Final    Comment: (NOTE) SARS-CoV-2 target nucleic acids are NOT DETECTED.  The SARS-CoV-2 RNA is generally detectable in upper and lower respiratory  specimens during the acute phase of infection. The lowest concentration of SARS-CoV-2 viral copies this assay can detect is 250 copies / mL. A negative result does not preclude SARS-CoV-2 infection and should not be used as the sole basis for treatment or other patient management decisions.  A negative result may occur with improper specimen collection / handling, submission of specimen other than nasopharyngeal swab, presence of viral mutation(s) within the areas targeted by this assay, and inadequate number of viral copies (<250 copies / mL). A negative result must be combined with clinical observations, patient history, and epidemiological information.  Fact Sheet for Patients:   StrictlyIdeas.no  Fact Sheet for Healthcare Providers: BankingDealers.co.za  This test is not yet approved or  cleared by the [place] FDA and has been authorized for detection and/or diagnosis of SARS-CoV-2 by FDA under an Emergency Use Authorization (EUA).  This EUA will remain in effect (meaning this test can be used) for the duration of the COVID-19 declaration under Section 564(b)(1) of the Act, 21 U.S.C. section 360bbb-3(b)(1), unless the authorization is terminated or revoked sooner.  Performed at [facility], [address], [place], [place] [address]   Blood Culture (routine x 2)     Status: Abnormal   Collection Time: 06/30/20  3:58 AM   Specimen: BLOOD RIGHT ARM  Result Value Ref Range Status   Specimen Description   Final    BLOOD RIGHT ARM Performed at [facility], [address] [place], [place] [address]    Special Requests   Final    BOTTLES DRAWN AEROBIC AND ANAEROBIC Blood Culture results may not be optimal due to an inadequate volume of blood received in culture bottles Performed at [facility] [place] [place], [place] [address]    Culture  Setup Time   Final    IN BOTH AEROBIC AND ANAEROBIC BOTTLES GRAM POSITIVE COCCI  IN CLUSTERS Gram Stain Report Called to,Read Back By and Verified With: J RHUE,RN _0  07/01/20 MKELLY CRITICAL RESULT CALLED TO, READ BACK BY AND VERIFIED WITH: RN Raquel Sarna [place] [id number] [phone] FCP    Culture (A)  Final  STAPHYLOCOCCUS SPECIES (COAGULASE NEGATIVE) THE SIGNIFICANCE OF ISOLATING THIS ORGANISM FROM A SINGLE SET OF BLOOD CULTURES WHEN MULTIPLE SETS ARE DRAWN IS UNCERTAIN. PLEASE NOTIFY THE MICROBIOLOGY DEPARTMENT WITHIN ONE WEEK IF SPECIATION AND SENSITIVITIES ARE REQUIRED. Performed at [facility], [place] [address] [place], [place] [address]    Report Status 07/03/2020 FINAL  Final  Blood Culture ID Panel (Reflexed)     Status: Abnormal   Collection Time: 06/30/20  3:58 AM  Result Value Ref Range Status   Enterococcus species NOT DETECTED NOT DETECTED Final   Listeria monocytogenes NOT DETECTED NOT DETECTED Final   Staphylococcus species DETECTED (A) NOT DETECTED Final    Comment: Methicillin (oxacillin) susceptible coagulase negative staphylococcus. Possible blood culture contaminant (unless isolated from more than one blood culture draw or clinical case suggests pathogenicity). No antibiotic treatment is indicated for blood  culture contaminants. CRITICAL RESULT CALLED TO, READ BACK BY AND VERIFIED WITH: RN Deirdre Pippins [phone] FCP    Staphylococcus aureus (BCID) NOT DETECTED NOT DETECTED Final   Methicillin resistance NOT DETECTED NOT DETECTED Final   Streptococcus species NOT DETECTED NOT DETECTED Final   Streptococcus agalactiae NOT DETECTED NOT DETECTED Final   Streptococcus pneumoniae NOT DETECTED NOT DETECTED Final   Streptococcus pyogenes NOT DETECTED NOT DETECTED Final   Acinetobacter baumannii NOT DETECTED NOT DETECTED Final   Enterobacteriaceae species NOT DETECTED NOT DETECTED Final   Enterobacter cloacae complex NOT DETECTED NOT DETECTED Final   Escherichia coli NOT DETECTED NOT DETECTED Final   Klebsiella oxytoca NOT DETECTED NOT DETECTED Final    Klebsiella pneumoniae NOT DETECTED NOT DETECTED Final   Proteus species NOT DETECTED NOT DETECTED Final   Serratia marcescens NOT DETECTED NOT DETECTED Final   Haemophilus influenzae NOT DETECTED NOT DETECTED Final   Neisseria meningitidis NOT DETECTED NOT DETECTED Final   Pseudomonas aeruginosa NOT DETECTED NOT DETECTED Final   Candida albicans NOT DETECTED NOT DETECTED Final   Candida glabrata NOT DETECTED NOT DETECTED Final   Candida krusei NOT DETECTED NOT DETECTED Final   Candida parapsilosis NOT DETECTED NOT DETECTED Final   Candida tropicalis NOT DETECTED NOT DETECTED Final    Comment: Performed at [facility], [place], [place], [place] [address]  Blood Culture (routine x 2)     Status: None (Preliminary result)   Collection Time: 06/30/20  5:02 AM   Specimen: BLOOD RIGHT ARM  Result Value Ref Range Status   Specimen Description BLOOD RIGHT ARM  Final   Special Requests   Final    BOTTLES DRAWN AEROBIC ONLY Blood Culture results may not be optimal due to an inadequate volume of blood received in culture bottles   Culture   Final    NO GROWTH 3 DAYS Performed at [facility], [address], [place], [place] [address]    Report Status PENDING  Incomplete  MRSA PCR Screening     Status: None   Collection Time: 06/30/20  9:08 AM   Specimen: Nasal Mucosa; Nasopharyngeal  Result Value Ref Range Status   MRSA by PCR NEGATIVE NEGATIVE Final    Comment:        The GeneXpert MRSA Assay (FDA approved for NASAL specimens only), is one component of a comprehensive MRSA colonization surveillance program. It is not intended to diagnose MRSA infection nor to guide or monitor treatment for MRSA infections. Performed at [facility], [address] [place],  [address]   C Difficile Quick Screen w PCR reflex  Status: None   Collection Time: 07/01/20 10:49 PM   Specimen: Stool  Result Value Ref Range Status   C Diff antigen NEGATIVE NEGATIVE Final   C Diff  toxin NEGATIVE NEGATIVE Final   C Diff interpretation No C. difficile detected.  Final    Comment: Performed at [facility], [address], [place], [place] [address]  Gastrointestinal Panel by PCR , Stool     Status: Abnormal   Collection Time: 07/01/20 10:49 PM   Specimen: Stool  Result Value Ref Range Status   Campylobacter species NOT DETECTED NOT DETECTED Final   Plesimonas shigelloides NOT DETECTED NOT DETECTED Final   Salmonella species NOT DETECTED NOT DETECTED Final   Yersinia enterocolitica NOT DETECTED NOT DETECTED Final   Vibrio species NOT DETECTED NOT DETECTED Final   Vibrio cholerae NOT DETECTED NOT DETECTED Final   Enteroaggregative E coli (EAEC) NOT DETECTED NOT DETECTED Final   Enteropathogenic E coli (EPEC) DETECTED (A) NOT DETECTED Final    Comment: CRITICAL RESULT CALLED TO, READ BACK BY AND VERIFIED WITH: EMILY [place] 07/02/20 1511 KLW    Enterotoxigenic E coli (ETEC) NOT DETECTED NOT DETECTED Final   Shiga like toxin producing E coli (STEC) NOT DETECTED NOT DETECTED Final   Shigella/Enteroinvasive E coli (EIEC) NOT DETECTED NOT DETECTED Final   Cryptosporidium NOT DETECTED NOT DETECTED Final   Cyclospora cayetanensis NOT DETECTED NOT DETECTED Final   Entamoeba histolytica NOT DETECTED NOT DETECTED Final   Giardia lamblia NOT DETECTED NOT DETECTED Final   Adenovirus F40/41 NOT DETECTED NOT DETECTED Final   Astrovirus NOT DETECTED NOT DETECTED Final   Norovirus GI/GII NOT DETECTED NOT DETECTED Final   Rotavirus A NOT DETECTED NOT DETECTED Final   Sapovirus (I, II, IV, and V) NOT DETECTED NOT DETECTED Final    Comment: Performed at [facility], [address] [place], [place] [address]  Culture, blood (Routine X 2) w Reflex to ID Panel     Status: None (Preliminary result)   Collection Time: 07/02/20  4:33 PM   Specimen: BLOOD RIGHT HAND  Result Value Ref Range Status   Specimen Description BLOOD RIGHT HAND  Final   Special Requests   Final     BOTTLES DRAWN AEROBIC AND ANAEROBIC Blood Culture adequate volume   Culture   Final    NO GROWTH < 24 HOURS Performed at [facility], [address] [place], [place] [address]    Report Status PENDING  Incomplete  Culture, blood (Routine X 2) w Reflex to ID Panel     Status: None (Preliminary result)   Collection Time: 07/02/20  4:33 PM   Specimen: BLOOD RIGHT HAND  Result Value Ref Range Status   Specimen Description BLOOD RIGHT HAND  Final   Special Requests   Final    BOTTLES DRAWN AEROBIC AND ANAEROBIC Blood Culture adequate volume   Culture   Final    NO GROWTH < 24 HOURS Performed at [facility], [address], [place], [place] [address]    Report Status PENDING  Incomplete    Radiology Reports DG Chest Port 1 View  Result Date: 06/30/2020 CLINICAL DATA:  Fever EXAM: PORTABLE CHEST 1 VIEW COMPARISON:  05/11/2019 FINDINGS: The heart size and mediastinal contours are within normal limits. Both lungs are clear. The visualized skeletal structures are unremarkable. IMPRESSION: No active disease. Electronically Signed   By: Ulyses Jarred M.D.   On: 06/30/2020 03:55     CBC Recent Labs  Lab 06/30/20 0328  07/01/20 0529 07/02/20 0609 07/03/20 0437  WBC 16.6* 14.3* 9.4 7.8  HGB 12.2 10.4* 10.7* 10.6*  HCT 37.6 32.4* 33.4* 33.5*  PLT 209 190 184 185  MCV 99.2 100.3* 100.3* 100.3*  MCH 32.2 32.2 32.1 31.7  MCHC 32.4 32.1 32.0 31.6  RDW 13.4 13.7 13.5 13.2  LYMPHSABS 0.6* 1.0 1.3 1.0  MONOABS 0.5 0.4 0.6 0.6  EOSABS 0.0 0.1 0.3 0.3  BASOSABS 0.0 0.0 0.0 0.1    Chemistries  Recent Labs  Lab 06/30/20 0328 07/01/20 0529 07/02/20 0609  NA 134* 136 135  K 5.4* 4.9 4.5  CL 96* 97* 93*  CO2 _0 GLUCOSE 237* 88 137*  BUN 63* 38* 52*  CREATININE 11.11* 7.43* 9.23*  CALCIUM 9.0 9.2 9.3  AST 16 12*  --   ALT 13 11  --   ALKPHOS 270* 199*  --   BILITOT 0.5 0.5  --     ------------------------------------------------------------------------------------------------------------------ No results for input(s): CHOL, HDL, LDLCALC, TRIG, CHOLHDL, LDLDIRECT in the last 72 hours.  Lab Results  Component Value Date   HGBA1C 7.2 (H) 06/30/2020   ------------------------------------------------------------------------------------------------------------------ No results for input(s): TSH, T4TOTAL, T3FREE, THYROIDAB in the last 72 hours.  Invalid input(s): FREET3 ------------------------------------------------------------------------------------------------------------------ No results for input(s): VITAMINB12, FOLATE, FERRITIN, TIBC, IRON, RETICCTPCT in the last 72 hours.  Coagulation profile Recent Labs  Lab 06/30/20 0328  INR 1.1    No results for input(s): DDIMER in the last 72 hours.  Cardiac Enzymes No results for input(s): CKMB, TROPONINI, MYOGLOBIN in the last 168 hours.  Invalid input(s): CK ------------------------------------------------------------------------------------------------------------------    Component Value Date/Time   BNP 85.0 06/30/2020 0328   Roxan Hockey M.D on 07/03/2020 at 11:17 AM  Go to [email or web address] - for contact info  [facility] - Office  [phone]

## 2020-07-03 NOTE — Plan of Care (Signed)
  Problem: Health Behavior/Discharge Planning: Goal: Ability to manage health-related needs will improve Outcome: Progressing   Problem: Clinical Measurements: Goal: Ability to maintain clinical measurements within normal limits will improve Outcome: Progressing Goal: Will remain free from infection Outcome: Progressing

## 2020-07-03 NOTE — Progress Notes (Signed)
Nephrology Progress Note:   Patient ID: [name], female   DOB: September 03, 1966, 54 y.o.   MRN: [id number]  Subjective: had HD on 7/14 with 5 kg UF charted.  Discussed importance of limiting fluids with her - she will try.  Review of systems:  Some nausea  Shortness of breath - got better with HD Leg pain  O:BP 111/61 (BP Location: Right Arm)   Pulse 91   Temp 98.3 F (36.8 C) (Oral)   Resp 18   Ht 5' 5" (1.651 m)   Wt (!) 136.4 kg   SpO2 97%   BMI 50.04 kg/m   Intake/Output Summary (Last 24 hours) at 07/03/2020 0818 Last data filed at 07/02/2020 1730 Gross per 24 hour  Intake 240 ml  Output 5011 ml  Net -4771 ml   Intake/Output: I/O last 3 completed shifts: In: 240 [P.O.:240] Out: 5011 [Other:5011]  Intake/Output this shift:  No intake/output data recorded. Weight change: 1 kg Gen: NAD, obese AAF lying in bed  CVS: RRR, no rub Resp: diminished BS  OOL:DZWAC, soft, NT/ND Ext: chronic lymphedema of bilateral lower extremities L>R, L forearm avf +T/B  Recent Labs  Lab 06/30/20 0328 07/01/20 0529 07/02/20 0609  NA 134* 136 135  K 5.4* 4.9 4.5  CL 96* 97* 93*  CO2 _0 GLUCOSE 237* 88 137*  BUN 63* 38* 52*  CREATININE 11.11* 7.43* 9.23*  ALBUMIN 4.0 3.3* 3.5  CALCIUM 9.0 9.2 9.3  PHOS  --   --  6.5*  AST 16 12*  --   ALT 13 11  --    Liver Function Tests: Recent Labs  Lab 06/30/20 0328 07/01/20 0529 07/02/20 0609  AST 16 12*  --   ALT 13 11  --   ALKPHOS 270* 199*  --   BILITOT 0.5 0.5  --   PROT 7.9 6.7  --   ALBUMIN 4.0 3.3* 3.5   No results for input(s): LIPASE, AMYLASE in the last 168 hours. No results for input(s): AMMONIA in the last 168 hours. CBC: Recent Labs  Lab 06/30/20 0328 06/30/20 0328 07/01/20 0529 07/02/20 0609 07/03/20 0437  WBC 16.6*   < > 14.3* 9.4 7.8  NEUTROABS 15.3*   < > 12.7* 7.2 5.7  HGB 12.2   < > 10.4* 10.7* 10.6*  HCT 37.6   < > 32.4* 33.4* 33.5*  MCV 99.2  --  100.3* 100.3* 100.3*  PLT 209   < > 190  184 185   < > = values in this interval not displayed.   Cardiac Enzymes: No results for input(s): CKTOTAL, CKMB, CKMBINDEX, TROPONINI in the last 168 hours. CBG: Recent Labs  Lab 07/02/20 0752 07/02/20 1154 07/02/20 1726 07/02/20 2059 07/03/20 0755  GLUCAP 132* 121* 167* 124* 143*    Iron Studies:  No results for input(s): IRON, TIBC, TRANSFERRIN, FERRITIN in the last 72 hours. Studies/Results: No results found. . enoxaparin (LOVENOX) injection  30 mg Subcutaneous Q24H  . feeding supplement (NEPRO CARB STEADY)  237 mL Oral BID BM  . insulin aspart  0-9 Units Subcutaneous TID WC  . lanthanum  1,500 mg Oral TID WC  . multivitamin  1 tablet Oral QHS    BMET    Component Value Date/Time   NA 135 07/02/2020 0609   K 4.5 07/02/2020 0609   CL 93 (L) 07/02/2020 0609   CO2 27 07/02/2020 0609   GLUCOSE 137 (H) 07/02/2020 0609   BUN 52 (H) 07/02/2020 0891  CREATININE 9.23 (H) 07/02/2020 0609   CALCIUM 9.3 07/02/2020 0609   CALCIUM 8.5 09/06/2012 1056   GFRNONAA 4 (L) 07/02/2020 0609   GFRAA 5 (L) 07/02/2020 0609   CBC    Component Value Date/Time   WBC 7.8 07/03/2020 0437   RBC 3.34 (L) 07/03/2020 0437   HGB 10.6 (L) 07/03/2020 0437   HCT 33.5 (L) 07/03/2020 0437   PLT 185 07/03/2020 0437   MCV 100.3 (H) 07/03/2020 0437   MCH 31.7 07/03/2020 0437   MCHC 31.6 07/03/2020 0437   RDW 13.2 07/03/2020 0437   LYMPHSABS 1.0 07/03/2020 0437   MONOABS 0.6 07/03/2020 0437   EOSABS 0.3 07/03/2020 0437   BASOSABS 0.1 07/03/2020 0437   Dialysis prescription:  [facility] on MWF schedule. EDW 135.5kg, 2K/2.5 Ca, BFR 350, DFR 600, Time 4:15 Heparin 1,000 bolus and then 1,000/hr epo- 1600 units per treatment venofer 50 mg IV qweek.  Assessment/Plan:  1. Febrile illness with bacteremia- on abx per primary svc.  She thinks it came from an ulcer behind her left knee from sitting in a wheelchair for too long and "busted" 4 days ago.   2.  ESRD- cont with HD on MWF  schedule. Renal panel in AM 3. HTN- controlled  4. CKD-BMD- continue with outpatient meds 5. Anemia- no acute indication for ESA  6. Chronic lymphedema - optimize volume status with HD   Claudia Desanctis 07/03/2020  8:25 AM

## 2020-07-04 DIAGNOSIS — A419 Sepsis, unspecified organism: Secondary | ICD-10-CM | POA: Diagnosis not present

## 2020-07-04 LAB — CBC WITH DIFFERENTIAL/PLATELET
Abs Immature Granulocytes: 0.13 10*3/uL — ABNORMAL HIGH (ref 0.00–0.07)
Basophils Absolute: 0.1 10*3/uL (ref 0.0–0.1)
Basophils Relative: 1 %
Eosinophils Absolute: 0.4 10*3/uL (ref 0.0–0.5)
Eosinophils Relative: 5 %
HCT: 35.5 % — ABNORMAL LOW (ref 36.0–46.0)
Hemoglobin: 11.1 g/dL — ABNORMAL LOW (ref 12.0–15.0)
Immature Granulocytes: 2 %
Lymphocytes Relative: 20 %
Lymphs Abs: 1.7 10*3/uL (ref 0.7–4.0)
MCH: 31.7 pg (ref 26.0–34.0)
MCHC: 31.3 g/dL (ref 30.0–36.0)
MCV: 101.4 fL — ABNORMAL HIGH (ref 80.0–100.0)
Monocytes Absolute: 0.6 10*3/uL (ref 0.1–1.0)
Monocytes Relative: 7 %
Neutro Abs: 5.6 10*3/uL (ref 1.7–7.7)
Neutrophils Relative %: 65 %
Platelets: 195 10*3/uL (ref 150–400)
RBC: 3.5 MIL/uL — ABNORMAL LOW (ref 3.87–5.11)
RDW: 13.4 % (ref 11.5–15.5)
WBC: 8.5 10*3/uL (ref 4.0–10.5)
nRBC: 0 % (ref 0.0–0.2)

## 2020-07-04 LAB — RENAL FUNCTION PANEL
Albumin: 3.2 g/dL — ABNORMAL LOW (ref 3.5–5.0)
Anion gap: 18 — ABNORMAL HIGH (ref 5–15)
BUN: 51 mg/dL — ABNORMAL HIGH (ref 6–20)
CO2: 23 mmol/L (ref 22–32)
Calcium: 8.8 mg/dL — ABNORMAL LOW (ref 8.9–10.3)
Chloride: 95 mmol/L — ABNORMAL LOW (ref 98–111)
Creatinine, Ser: 9.27 mg/dL — ABNORMAL HIGH (ref 0.44–1.00)
GFR calc Af Amer: 5 mL/min — ABNORMAL LOW (ref >60)
GFR calc non Af Amer: 4 mL/min — ABNORMAL LOW (ref >60)
Glucose, Bld: 128 mg/dL — ABNORMAL HIGH (ref 70–99)
Phosphorus: 6.6 mg/dL — ABNORMAL HIGH (ref 2.5–4.6)
Potassium: 4.9 mmol/L (ref 3.5–5.1)
Sodium: 136 mmol/L (ref 135–145)

## 2020-07-04 LAB — COMPREHENSIVE METABOLIC PANEL
ALT: 11 U/L (ref 0–44)
AST: 17 U/L (ref 15–41)
Albumin: 3.2 g/dL — ABNORMAL LOW (ref 3.5–5.0)
Alkaline Phosphatase: 191 U/L — ABNORMAL HIGH (ref 38–126)
Anion gap: 15 (ref 5–15)
BUN: 52 mg/dL — ABNORMAL HIGH (ref 6–20)
CO2: 25 mmol/L (ref 22–32)
Calcium: 8.7 mg/dL — ABNORMAL LOW (ref 8.9–10.3)
Chloride: 95 mmol/L — ABNORMAL LOW (ref 98–111)
Creatinine, Ser: 9.25 mg/dL — ABNORMAL HIGH (ref 0.44–1.00)
GFR calc Af Amer: 5 mL/min — ABNORMAL LOW (ref >60)
GFR calc non Af Amer: 4 mL/min — ABNORMAL LOW (ref >60)
Glucose, Bld: 128 mg/dL — ABNORMAL HIGH (ref 70–99)
Potassium: 4.9 mmol/L (ref 3.5–5.1)
Sodium: 135 mmol/L (ref 135–145)
Total Bilirubin: 0.7 mg/dL (ref 0.3–1.2)
Total Protein: 6.9 g/dL (ref 6.5–8.1)

## 2020-07-04 LAB — GLUCOSE, CAPILLARY
Glucose-Capillary: 153 mg/dL — ABNORMAL HIGH (ref 70–99)
Glucose-Capillary: 122 mg/dL — ABNORMAL HIGH (ref 70–99)
Glucose-Capillary: 139 mg/dL — ABNORMAL HIGH (ref 70–99)

## 2020-07-04 MED ORDER — MIDODRINE HCL 10 MG PO TABS: 10.0000 mg | ORAL_TABLET | Freq: Two times a day (BID) | ORAL | 3 refills | Status: DC

## 2020-07-04 MED ORDER — ACETAMINOPHEN 325 MG PO TABS: 650.0000 mg | ORAL_TABLET | ORAL | 1 refills | Status: DC | PRN

## 2020-07-04 MED ORDER — AMLODIPINE BESYLATE 2.5 MG PO TABS: 2.5000 mg | ORAL_TABLET | Freq: Every day | ORAL | 2 refills | Status: DC

## 2020-07-04 NOTE — Plan of Care (Signed)

## 2020-07-04 NOTE — Care Management Important Message (Signed)
Important Message  Patient Details  Name: [name] MRN: [id number] Date of Birth: [birth year]   Medicare Important Message Given:  Yes     [name] 07/04/2020, 2:21 PM

## 2020-07-04 NOTE — Progress Notes (Signed)
Nephrology Progress Note:   Patient ID: [name], female   DOB: September 24, 1966, 54 y.o.   MRN: [id number]  Subjective: last had HD on 7/14 with 5 kg UF charted.  She feels well and has tried to limit fluid.  States maybe trouble yesterday was COPD?    Review of systems:    Some nausea - better - some diarrhea as well Shortness of breath much better Leg pain  O:BP 106/65 (BP Location: Right Wrist)    Pulse 85    Temp 98.5 F (36.9 C)    Resp 20    Ht _0  (1.651 m)    Wt (!) 136.4 kg    SpO2 94%    BMI 50.04 kg/m   Intake/Output Summary (Last 24 hours) at 07/04/2020 0819 Last data filed at 07/03/2020 1830 Gross per 24 hour  Intake 240 ml  Output --  Net 240 ml   Intake/Output: I/O last 3 completed shifts: In: 240 [P.O.:240] Out: -   Intake/Output this shift:  No intake/output data recorded. Weight change:  Gen: NAD, obese AAF lying in bed   CVS: S1S2 no rub Resp: clear and unlabored room air  WUX:LKGMW, soft, NT/ND Ext: chronic lymphedema of bilateral lower extremities  Neuro - alert and oriented x 3 provides hx and follows commands Psych normal mood and affect Access: L forearm avf +T/B  Recent Labs  Lab 06/30/20 0328 07/01/20 0529 07/02/20 0609  NA 134* 136 135  K 5.4* 4.9 4.5  CL 96* 97* 93*  CO2 _1 GLUCOSE 237* 88 137*  BUN 63* 38* 52*  CREATININE 11.11* 7.43* 9.23*  ALBUMIN 4.0 3.3* 3.5  CALCIUM 9.0 9.2 9.3  PHOS  --   --  6.5*  AST 16 12*  --   ALT 13 11  --    Liver Function Tests: Recent Labs  Lab 06/30/20 0328 07/01/20 0529 07/02/20 0609  AST 16 12*  --   ALT 13 11  --   ALKPHOS 270* 199*  --   BILITOT 0.5 0.5  --   PROT 7.9 6.7  --   ALBUMIN 4.0 3.3* 3.5   No results for input(s): LIPASE, AMYLASE in the last 168 hours. No results for input(s): AMMONIA in the last 168 hours. CBC: Recent Labs  Lab 06/30/20 0328 06/30/20 0328 07/01/20 0529 07/02/20 0609 07/03/20 0437  WBC 16.6*   < > 14.3* 9.4 7.8  NEUTROABS 15.3*   < >  12.7* 7.2 5.7  HGB 12.2   < > 10.4* 10.7* 10.6*  HCT 37.6   < > 32.4* 33.4* 33.5*  MCV 99.2  --  100.3* 100.3* 100.3*  PLT 209   < > 190 184 185   < > = values in this interval not displayed.   Cardiac Enzymes: No results for input(s): CKTOTAL, CKMB, CKMBINDEX, TROPONINI in the last 168 hours. CBG: Recent Labs  Lab 07/02/20 2059 07/03/20 0755 07/03/20 1137 07/03/20 2206 07/04/20 0727  GLUCAP 124* 143* 157* 147* 122*    Iron Studies:  No results for input(s): IRON, TIBC, TRANSFERRIN, FERRITIN in the last 72 hours. Studies/Results: No results found.  Chlorhexidine Gluconate Cloth  6 each Topical Q0600   enoxaparin (LOVENOX) injection  30 mg Subcutaneous Q24H   feeding supplement (NEPRO CARB STEADY)  237 mL Oral BID BM   insulin aspart  0-9 Units Subcutaneous TID WC   lanthanum  1,500 mg Oral TID WC   multivitamin  1 tablet Oral QHS  BMET    Component Value Date/Time   NA 135 07/02/2020 0609   K 4.5 07/02/2020 0609   CL 93 (L) 07/02/2020 0609   CO2 27 07/02/2020 0609   GLUCOSE 137 (H) 07/02/2020 0609   BUN 52 (H) 07/02/2020 0609   CREATININE 9.23 (H) 07/02/2020 0609   CALCIUM 9.3 07/02/2020 0609   CALCIUM 8.5 09/06/2012 1056   GFRNONAA 4 (L) 07/02/2020 0609   GFRAA 5 (L) 07/02/2020 0609   CBC    Component Value Date/Time   WBC 7.8 07/03/2020 0437   RBC 3.34 (L) 07/03/2020 0437   HGB 10.6 (L) 07/03/2020 0437   HCT 33.5 (L) 07/03/2020 0437   PLT 185 07/03/2020 0437   MCV 100.3 (H) 07/03/2020 0437   MCH 31.7 07/03/2020 0437   MCHC 31.6 07/03/2020 0437   RDW 13.2 07/03/2020 0437   LYMPHSABS 1.0 07/03/2020 0437   MONOABS 0.6 07/03/2020 0437   EOSABS 0.3 07/03/2020 0437   BASOSABS 0.1 07/03/2020 0437   Dialysis prescription:  [facility]  on MWF schedule. EDW 135.5kg, 2K/2.5 Ca, BFR 350, DFR 600, Time 4:15 Heparin 1,000 bolus and then 1,000/hr epo- 1600 units per treatment venofer 50 mg IV qweek.  Assessment/Plan:  1. Febrile illness  with bacteremia- on abx per primary svc.  She thinks it came from an ulcer behind her left knee from sitting in a wheelchair for too long and "busted".  Repeat blood cx NGTD  2.  ESRD- cont with HD on MWF schedule. Will follow-up electrolytes 7/16.  Renal panel in AM ordered daily. 3. HTN- controlled; optimize volume with HD  4. CKD-BMD- continue with fosrenol  5. Anemia- no acute indication for ESA  6. Chronic lymphedema - optimize volume status with HD   Claudia Desanctis 07/04/2020  8:26 AM

## 2020-07-04 NOTE — Discharge Instructions (Signed)
1)Please Always take Midodrine (do Not hold it)  before hemodialysis to avoid low blood pressure 2)Avoid ibuprofen/Advil/Aleve/Motrin/Goody Powders/Naproxen/BC powders/Meloxicam/Diclofenac/Indomethacin and other Nonsteroidal anti-inflammatory medications as these will make you more likely to bleed and can cause stomach ulcers, can also cause Kidney problems.   3) please continue hemodialysis on Mondays Wednesdays and Fridays

## 2020-07-04 NOTE — Plan of Care (Signed)
  Problem: Education: Goal: Knowledge of General Education information will improve Description: Including pain rating scale, medication(s)/side effects and non-pharmacologic comfort measures 07/04/2020 1537 by Melony Overly, RN Outcome: Adequate for Discharge 07/04/2020 0949 by Melony Overly, RN Outcome: Progressing   Problem: Health Behavior/Discharge Planning: Goal: Ability to manage health-related needs will improve 07/04/2020 1537 by Melony Overly, RN Outcome: Adequate for Discharge 07/04/2020 0949 by Melony Overly, RN Outcome: Progressing   Problem: Clinical Measurements: Goal: Ability to maintain clinical measurements within normal limits will improve 07/04/2020 1537 by Melony Overly, RN Outcome: Adequate for Discharge 07/04/2020 0949 by Melony Overly, RN Outcome: Progressing Goal: Will remain free from infection 07/04/2020 1537 by Melony Overly, RN Outcome: Adequate for Discharge 07/04/2020 0949 by Melony Overly, RN Outcome: Progressing Goal: Diagnostic test results will improve 07/04/2020 1537 by Melony Overly, RN Outcome: Adequate for Discharge 07/04/2020 0949 by Melony Overly, RN Outcome: Progressing Goal: Respiratory complications will improve 07/04/2020 1537 by Melony Overly, RN Outcome: Adequate for Discharge 07/04/2020 0949 by Melony Overly, RN Outcome: Progressing Goal: Cardiovascular complication will be avoided 07/04/2020 1537 by Melony Overly, RN Outcome: Adequate for Discharge 07/04/2020 0949 by Melony Overly, RN Outcome: Progressing   Problem: Activity: Goal: Risk for activity intolerance will decrease 07/04/2020 1537 by Melony Overly, RN Outcome: Adequate for Discharge 07/04/2020 0949 by Melony Overly, RN Outcome: Progressing   Problem: Nutrition: Goal: Adequate nutrition will be maintained 07/04/2020 1537 by Melony Overly, RN Outcome: Adequate for Discharge 07/04/2020 0949 by Melony Overly, RN Outcome: Progressing    Problem: Coping: Goal: Level of anxiety will decrease 07/04/2020 1537 by Melony Overly, RN Outcome: Adequate for Discharge 07/04/2020 0949 by Melony Overly, RN Outcome: Progressing   Problem: Elimination: Goal: Will not experience complications related to bowel motility 07/04/2020 1537 by Melony Overly, RN Outcome: Adequate for Discharge 07/04/2020 0949 by Melony Overly, RN Outcome: Progressing Goal: Will not experience complications related to urinary retention 07/04/2020 1537 by Melony Overly, RN Outcome: Adequate for Discharge 07/04/2020 0949 by Melony Overly, RN Outcome: Progressing   Problem: Pain Managment: Goal: General experience of comfort will improve 07/04/2020 1537 by Melony Overly, RN Outcome: Adequate for Discharge 07/04/2020 0949 by Melony Overly, RN Outcome: Progressing   Problem: Safety: Goal: Ability to remain free from injury will improve 07/04/2020 1537 by Melony Overly, RN Outcome: Adequate for Discharge 07/04/2020 0949 by Melony Overly, RN Outcome: Progressing   Problem: Skin Integrity: Goal: Risk for impaired skin integrity will decrease 07/04/2020 1537 by Melony Overly, RN Outcome: Adequate for Discharge 07/04/2020 0949 by Melony Overly, RN Outcome: Progressing

## 2020-07-04 NOTE — Discharge Summary (Signed)
[name], is a 54 y.o. female  DOB Jan 17, 1966  MRN [id number].  Admission date:  06/30/2020  Admitting Physician  Katie Moch Denton Brick, MD  Discharge Date:  07/04/2020   Primary MD  [name], MD  Recommendations for primary care physician for things to follow:   1)Please Always take Midodrine (do Not hold it)  before hemodialysis to avoid low blood pressure 2)Avoid ibuprofen/Advil/Aleve/Motrin/Goody Powders/Naproxen/BC powders/Meloxicam/Diclofenac/Indomethacin and other Nonsteroidal anti-inflammatory medications as these will make you more likely to bleed and can cause stomach ulcers, can also cause Kidney problems.   3) please continue hemodialysis on Mondays Wednesdays and Fridays  Admission Diagnosis  SIRS (systemic inflammatory response syndrome) ([place]) [R65.10] Febrile illness [R50.9] Sepsis due to undetermined organism ([place]) [A41.9] Bacteremia [R78.81]   Discharge Diagnosis  SIRS (systemic inflammatory response syndrome) ([place]) [R65.10] Febrile illness [R50.9] Sepsis due to undetermined organism ([place]) [A41.9] Bacteremia [R78.81]    Principal Problem:   Sepsis due to undetermined organism [facility]) Active Problems:   Enteritis, enteropathogenic E. coli   Hyperkalemia   HTN (hypertension)   Chronic diastolic CHF (congestive heart failure) (HCC)   ESRD on dialysis ([place])   Hyperlipidemia, unspecified   Lymphedema, not elsewhere classified   Morbid (severe) obesity due to excess calories ([place])   Type 2 diabetes mellitus with diabetic nephropathy ([place])   Bacteremia      Past Medical History:  Diagnosis Date  . Anemia, unspecified   . Anxiety   . Asthma    as a child  . Cancer ([place])    thyroid  . Cellulitis   . CHF (congestive heart failure) ([place])   . Chiari malformation    s/p surgery  . Chiari malformation   . Chronic kidney disease, stage 5 ([place])   . Chronic obstructive  pulmonary disease, unspecified ([place])   . Chronic pain   . Complication of anesthesia 11/28/15   Resp arrest after  conscious  sedation  . Compression of brain ([place])   . Constipation   . Coronary artery disease    40-50% mid LAD 04/29/09, Medical tx. (Dr. Gwenlyn Found)  . Diabetes mellitus    Type II- reports being off all medication d/t it being controlled  . Essential (primary) hypertension   . Fibromyalgia   . Fibromyalgia   . History of blood transfusion    hemorrage duinrg pregancy  . Hx of echocardiogram 10/2011   EF 55-60%  . Hypercholesterolemia   . Hyperlipidemia, unspecified   . Hypertension   . Hypertensive chronic kidney disease with stage 1 through stage 4 chronic kidney disease, or unspecified chronic kidney disease   . Hypertensive chronic kidney disease with stage 5 chronic kidney disease or end stage renal disease ([place])   . Lymph edema   . Lymphedema, not elsewhere classified   . Morbid (severe) obesity due to excess calories ([place])   . Obesity hypoventilation syndrome ([place])   . On home oxygen therapy    "2L; 24/7" (12/21/2018)  . Peritonitis, unspecified ([place])   . Pneumonia  in past  . Pulmonary hypertension ([place])   . Renal disorder    M/W/F [facility]  Pt started dialysis in Dec.2016  . S/P colonoscopy 05/26/2007   Dr. Laural Golden sigmoid diverticulosis random biopsies benign  . S/P endoscopy 05/01/2009   Dr. Penelope Coop pill-induced esophageal ulcerations distal to midesophagus, 2 small ulcers in the antrum of the stomach  . Shortness of breath dyspnea    with any exertion or if heart rate  is irregular while on dialysis  . Sleep apnea    reports that she no longer needs CPAP due to weight loss  . Sleep apnea, unspecified   . Type 2 diabetes mellitus with diabetic nephropathy ([place])   . Type 2 diabetes mellitus with hyperglycemia [facility])     Past Surgical History:  Procedure Laterality Date  . Marland KitchenHemodialysis catheter Right 11/28/2015  . A/V FISTULAGRAM N/A 07/26/2017     Procedure: A/V Fistulagram - Left Arm;  Surgeon: Serafina Mitchell, MD;  Location: [facility];  Service: Cardiovascular;  Laterality: N/A;  . ABDOMINAL HYSTERECTOMY    . ADENOIDECTOMY    . AV FISTULA PLACEMENT Left 03/15/2016   Procedure: CREATION OF LEFT ARM ARTERIOVENOUS (AV) FISTULA  ;  Surgeon: Rosetta Posner, MD;  Location: [place];  Service: Vascular;  Laterality: Left;  . CAPD INSERTION N/A 08/30/2017   Procedure: LAPAROSCOPIC INSERTION CONTINUOUS AMBULATORY PERITONEAL DIALYSIS  (CAPD) CATHETER;  Surgeon: Clovis Riley, MD;  Location: [place];  Service: General;  Laterality: N/A;  . CESAREAN SECTION      x 2  . CRANIECTOMY SUBOCCIPITAL W/ CERVICAL LAMINECTOMY / CHIARI    . FISTULA SUPERFICIALIZATION Left 05/10/2016   Procedure: Left Arm FISTULA SUPERFICIALIZATION;  Surgeon: Rosetta Posner, MD;  Location[facility];  Service: Vascular;  Laterality: Left;  . IR GENERIC HISTORICAL  08/14/2016   IR REMOVAL TUN ACCESS W/ PORT W/O FL MOD SED 08/14/2016 Arne Cleveland, MD [facility]-INTERV RAD  . IR SINUS/FIST TUBE CHK-NON GI  01/01/2019  . IR US GUIDE BX ASP/DRAIN  12/28/2018  . MINOR REMOVAL OF PERITONEAL DIALYSIS CATHETER N/A 04/26/2019   Procedure: REMOVAL OF INFECTED PERITONEAL DIALYSIS CATHETER;  Surgeon: Erroll Luna, MD;  Location: [place];  Service: General;  Laterality: N/A;  . PERIPHERAL VASCULAR BALLOON ANGIOPLASTY Left 10/18/2019   Procedure: PERIPHERAL VASCULAR BALLOON ANGIOPLASTY;  Surgeon: Marty Heck, MD;  Location: [facility] [facility];  Service: Cardiovascular;  Laterality: Left;  arm fistula  . PERIPHERAL VASCULAR CATHETERIZATION Left 11/25/2016   Procedure: A/V Fistulagram;  Surgeon: Conrad Hemby Bridge, MD;  Location: [place] [facility];  Service: Cardiovascular;  Laterality: Left;  arm  . PORTACATH PLACEMENT  07/05/2012   Procedure: INSERTION PORT-A-CATH;  Surgeon: Donato Heinz, MD;  Location: [facility];  Service: General;  Laterality: Left;  subclavian  . portacath removal    . RIGHT  HEART CATH N/A 08/04/2017   Procedure: RIGHT HEART CATH;  Surgeon: Jolaine Artist, MD;  Location: [place] [facility];  Service: Cardiovascular;  Laterality: N/A;  . THYROIDECTOMY, PARTIAL    . TONSILLECTOMY       HPI  from the history and physical done on the day of admission:    Chief Complaint: Fever.  HPI: [name] is a 54 y.o. female with medical history significant of anemia, anxiety, asthma/COPD, ESRD on hemodialysis, history of cellulitis, history of thyroid cancer, CAD, chronic diastolic heart failure, history of Chiari malformation s/p surgery, chronic pain, history of respiratory arrest  after conscious sedation, history of compression of brain, constipation, type II DM, fibromyalgia, essential hypertension, hyperlipidemia, lymphedema, morbid obesity, obesity hypoventilation syndrome, sleep apnea, history of pneumonia, history of peritonitis, pulmonary hypertension who is coming to the emergency department due to fever since yesterday evening associated with chills and fatigue.  She denies headache, sore throat, rhinorrhea, dyspnea, wheezing or hemoptysis.  No chest pain, dizziness, palpitations, diaphoresis, PND or orthopnea.  She denies abdominal pain, nausea, vomiting, diarrhea, constipation, melena or hematochezia.  She states that she normally does not urinate since being on dialysis..  ED Course: Initial vital signs were temperature 103 F, pulse 110, respiration 20, blood pressure 157/79 mmHg and O2 sat 96% on room air.  She received 1800 mL of LR bolus, cefepime, clindamycin and metronidazole.  She has received vancomycin in the past in the hospital, but had a severe reaction the dialysis center while infusing clindamycin after her hospitalization for peritonitis from May 2020.  Blood cultures were drawn before antibiotic therapy.  White count 6.6, hemoglobin 12.2 g/dL and platelets 209.  PT is/INR/PTT within normal range.  D-dimer was elevated at 0.95.  Fibrinogen was  584.  Ferritin 865, CRP 6.2.  Procalcitonin was 5.09 ng/mL.  BNP was 85.0 pg/mL.  Lactic acid was 2.3 and then 1.8 mmol/L.  CMP showed a sodium of 134, potassium 5.4, chloride 96 and CO2 22 mmol/L.  Glucose 237, BUN 63 and creatinine 11.11.  Her LFTs are within normal range, except for an alkaline phosphatase of 217.  Her chest radiograph did not have any acute cardiopulmonary pathology.    Hospital Course:   Brief Summary:- 54 y.o. female with medical history significant of anemia, anxiety, asthma/COPD, ESRD on hemodialysis, cellulitis, thyroid cancer, CAD, chronic diastolic heart failure, Chiari malformation s/p surgery, chronic pain, type II DM, fibromyalgia,HLD, HTN, lymphedema, morbid obesity, obesity hypoventilation syndrome, sleep apnea,  pulmonary hypertension admitted on 06/30/2020 with sepsis  A/p 1)Bacteremia- Now afebrile,BCID is + for CONS in 1 bottle. PCT elevated 5.09, - --Stopped linezolid and cefepime  -Stopped IV Flagyl -Patient was then treated with IV Rocephin mostly for enteropathogenic E. coli in the stool rather than blood cultures -Leukocytosis has resolved -On admission patient had fevers, tachycardia, tachypnea--- patient met SIRS criteria, no evidence of significant end-organ dysfunction per se at this time, patient does NOT meet sepsis criteria at this time -Positive blood culture if deemed to be contaminant - 2)Diarrhea secondary to enteropathogenic E. coli --- -treat as above #1 -IV Rocephin started 07/02/2020  -Diarrhea has improved significantly no further antibiotics indicated at this time  3)ESRD- continue HD Monday Wednesdays and Fridays, nephrology consult appreciated -Tolerated HD well    4)HTN-continue midodrine including on hemodialysis days to avoid hypotension  5) chronic anemia of CKD--- ESA/Procrit per nephrology team  6)Chronic lymphedema--- supportive care, continue to use hemodialysis to address volume status  7)HFpEF--stable,  appears euvolemic, continue to use HD to address volume status  8)DM2--continue home regimen   9)Depression and anxiety--- on Zoloft and alprazolam  Disposition--much improved overall discharge home  Code Status : Full  Family Communication:    (patient is alert, awake and coherent)  Consults  :  Nephrology  Discharge Condition: Stable  Follow UP--- nephrologist and PCP as advised  Diet and Activity recommendation:  As advised  Discharge Instructions    Discharge Instructions    Call MD for:  difficulty breathing, headache or visual disturbances   Complete by: As directed    Call MD for:  persistant  dizziness or light-headedness   Complete by: As directed    Call MD for:  persistant nausea and vomiting   Complete by: As directed    Call MD for:  severe uncontrolled pain   Complete by: As directed    Call MD for:  temperature >100.4   Complete by: As directed    Diet - low sodium heart healthy   Complete by: As directed    Discharge instructions   Complete by: As directed    1)Please Always take Midodrine (do Not hold it)  before hemodialysis to avoid low blood pressure 2)Avoid ibuprofen/Advil/Aleve/Motrin/Goody Powders/Naproxen/BC powders/Meloxicam/Diclofenac/Indomethacin and other Nonsteroidal anti-inflammatory medications as these will make you more likely to bleed and can cause stomach ulcers, can also cause Kidney problems.   3) please continue hemodialysis on Mondays Wednesdays and Fridays   Increase activity slowly   Complete by: As directed         Discharge Medications     Allergies as of 07/04/2020      Reactions   Contrast Media [iodinated Diagnostic Agents] Anaphylaxis, Hives, Swelling, Other (See Comments)   Dye for cardiac cath. Tongue swells   Pneumococcal Vaccines Swelling, Other (See Comments)   Turns skin black, and bodily swelling   Vancomycin Nausea And Vomiting, Other (See Comments)   Infusion "made me feel like I was dying" had to be  readmitted to hospital      Medication List    TAKE these medications   acetaminophen 325 MG tablet Commonly known as: TYLENOL Take 2 tablets (650 mg total) by mouth every 4 (four) hours as needed for mild pain (or Fever >/= 101). What changed:   medication strength  how much to take  when to take this  reasons to take this   albuterol (2.5 MG/3ML) 0.083% nebulizer solution Commonly known as: PROVENTIL Take 2.5 mg by nebulization every 6 (six) hours as needed for wheezing or shortness of breath.   albuterol 108 (90 Base) MCG/ACT inhaler Commonly known as: VENTOLIN HFA Inhale 2 puffs into the lungs 4 (four) times daily.   ALPRAZolam 0.5 MG tablet Commonly known as: XANAX Take 0.5 mg by mouth 2 (two) times daily as needed for anxiety.   amLODipine 2.5 MG tablet Commonly known as: NORVASC Take 1 tablet (2.5 mg total) by mouth daily. Hold it on your hemodialysis- What changed:   medication strength  how much to take  additional instructions   diphenhydrAMINE 25 mg capsule Commonly known as: BENADRYL Take 25 mg by mouth daily as needed for allergies.   insulin detemir 100 UNIT/ML injection Commonly known as: LEVEMIR Inject 0.03 mLs (3 Units total) into the skin daily.   lactulose 10 GM/15ML solution Commonly known as: CHRONULAC Take 45 mLs (30 g total) by mouth 2 (two) times daily. For constipation   lanthanum 500 MG chewable tablet Commonly known as: FOSRENOL Chew 1,000-1,500 mg by mouth 5 (five) times daily. 1500 mg by mouth three times a day with meals and 1000 mg by mouth twice a day with snacks   loperamide 2 MG capsule Commonly known as: IMODIUM Take 2-4 mg by mouth as needed for diarrhea or loose stools.   Melatonin 5 MG Caps Take 5 mg by mouth at bedtime as needed (sleep).   midodrine 10 MG tablet Commonly known as: PROAMATINE Take 1 tablet (10 mg total) by mouth 2 (two) times daily with a meal. --Always take it (do Not hold it)  before  hemodialysis to avoid low blood  pressure What changed: additional instructions   multivitamin Tabs tablet Take 1 tablet by mouth daily.   nystatin powder Generic drug: nystatin Apply 1 application topically 2 (two) times daily.   Oxycodone HCl 10 MG Tabs Take 1 tablet (10 mg total) by mouth 4 (four) times daily as needed (pain).   sertraline 50 MG tablet Commonly known as: ZOLOFT Take 50 mg by mouth daily.       Major procedures and Radiology Reports - PLEASE review detailed and final reports for all details, in brief -   DG Chest Port 1 View  Result Date: 06/30/2020 CLINICAL DATA:  Fever EXAM: PORTABLE CHEST 1 VIEW COMPARISON:  05/11/2019 FINDINGS: The heart size and mediastinal contours are within normal limits. Both lungs are clear. The visualized skeletal structures are unremarkable. IMPRESSION: No active disease. Electronically Signed   By: Ulyses Jarred M.D.   On: 06/30/2020 03:55   Micro Results   Recent Results (from the past 240 hour(s))  SARS Coronavirus 2 by RT PCR (hospital order, performed in [facility] hospital lab) Nasopharyngeal Nasopharyngeal Swab     Status: None   Collection Time: 06/30/20  3:36 AM   Specimen: Nasopharyngeal Swab  Result Value Ref Range Status   SARS Coronavirus 2 NEGATIVE NEGATIVE Final    Comment: (NOTE) SARS-CoV-2 target nucleic acids are NOT DETECTED.  The SARS-CoV-2 RNA is generally detectable in upper and lower respiratory specimens during the acute phase of infection. The lowest concentration of SARS-CoV-2 viral copies this assay can detect is 250 copies / mL. A negative result does not preclude SARS-CoV-2 infection and should not be used as the sole basis for treatment or other patient management decisions.  A negative result may occur with improper specimen collection / handling, submission of specimen other than nasopharyngeal swab, presence of viral mutation(s) within the areas targeted by this assay, and inadequate number  of viral copies (<250 copies / mL). A negative result must be combined with clinical observations, patient history, and epidemiological information.  Fact Sheet for Patients:   StrictlyIdeas.no  Fact Sheet for Healthcare Providers: BankingDealers.co.za  This test is not yet approved or  cleared by the [place] FDA and has been authorized for detection and/or diagnosis of SARS-CoV-2 by FDA under an Emergency Use Authorization (EUA).  This EUA will remain in effect (meaning this test can be used) for the duration of the COVID-19 declaration under Section 564(b)(1) of the Act, 21 U.S.C. section 360bbb-3(b)(1), unless the authorization is terminated or revoked sooner.  Performed at [facility], [address] [place], [place] [address]   Blood Culture (routine x 2)     Status: Abnormal   Collection Time: 06/30/20  3:58 AM   Specimen: BLOOD RIGHT ARM  Result Value Ref Range Status   Specimen Description   Final    BLOOD RIGHT ARM Performed at [facility], [address] [place], [place] [address]    Special Requests   Final    BOTTLES DRAWN AEROBIC AND ANAEROBIC Blood Culture results may not be optimal due to an inadequate volume of blood received in culture bottles Performed at [facility], [address] [place], [place] [address]    Culture  Setup Time   Final    IN BOTH AEROBIC AND ANAEROBIC BOTTLES GRAM POSITIVE COCCI IN CLUSTERS Gram Stain Report Called to,Read Back By and Verified With: J RHUE,RN _0  07/01/20 MKELLY CRITICAL RESULT CALLED TO, READ BACK BY AND VERIFIED WITH: RN Deirdre Pippins [id number] [phone] FCP  Culture (A)  Final    STAPHYLOCOCCUS SPECIES (COAGULASE NEGATIVE) THE SIGNIFICANCE OF ISOLATING THIS ORGANISM FROM A SINGLE SET OF BLOOD CULTURES WHEN MULTIPLE SETS ARE DRAWN IS UNCERTAIN. PLEASE NOTIFY THE MICROBIOLOGY DEPARTMENT WITHIN ONE WEEK IF SPECIATION AND SENSITIVITIES ARE REQUIRED. Performed at [facility], [place] [address] [place], [place] [address]    Report Status 07/03/2020 FINAL  Final  Blood Culture ID Panel (Reflexed)     Status: Abnormal   Collection Time: 06/30/20  3:58 AM  Result Value Ref Range Status   Enterococcus species NOT DETECTED NOT DETECTED Final   Listeria monocytogenes NOT DETECTED NOT DETECTED Final   Staphylococcus species DETECTED (A) NOT DETECTED Final    Comment: Methicillin (oxacillin) susceptible coagulase negative staphylococcus. Possible blood culture contaminant (unless isolated from more than one blood culture draw or clinical case suggests pathogenicity). No antibiotic treatment is indicated for blood  culture contaminants. CRITICAL RESULT CALLED TO, READ BACK BY AND VERIFIED WITH: RN Deirdre Pippins [phone] FCP    Staphylococcus aureus (BCID) NOT DETECTED NOT DETECTED Final   Methicillin resistance NOT DETECTED NOT DETECTED Final   Streptococcus species NOT DETECTED NOT DETECTED Final   Streptococcus agalactiae NOT DETECTED NOT DETECTED Final   Streptococcus pneumoniae NOT DETECTED NOT DETECTED Final   Streptococcus pyogenes NOT DETECTED NOT DETECTED Final   Acinetobacter baumannii NOT DETECTED NOT DETECTED Final   Enterobacteriaceae species NOT DETECTED NOT DETECTED Final   Enterobacter cloacae complex NOT DETECTED NOT DETECTED Final   Escherichia coli NOT DETECTED NOT DETECTED Final   Klebsiella oxytoca NOT DETECTED NOT DETECTED Final   Klebsiella pneumoniae NOT DETECTED NOT DETECTED Final   Proteus species NOT DETECTED NOT DETECTED Final   Serratia marcescens NOT DETECTED NOT DETECTED Final   Haemophilus influenzae NOT DETECTED NOT DETECTED Final   Neisseria meningitidis NOT DETECTED NOT DETECTED Final   Pseudomonas aeruginosa NOT DETECTED NOT DETECTED Final   Candida albicans NOT DETECTED NOT DETECTED Final   Candida glabrata NOT DETECTED NOT DETECTED Final   Candida krusei NOT DETECTED NOT DETECTED Final   Candida parapsilosis NOT  DETECTED NOT DETECTED Final   Candida tropicalis NOT DETECTED NOT DETECTED Final    Comment: Performed at [facility], [place]. [address], [place], [place] [address]  Blood Culture (routine x 2)     Status: None (Preliminary result)   Collection Time: 06/30/20  5:02 AM   Specimen: BLOOD RIGHT ARM  Result Value Ref Range Status   Specimen Description BLOOD RIGHT ARM  Final   Special Requests   Final    BOTTLES DRAWN AEROBIC ONLY Blood Culture results may not be optimal due to an inadequate volume of blood received in culture bottles   Culture   Final    NO GROWTH 4 DAYS Performed at [facility], [address], [place], [place] [address]    Report Status PENDING  Incomplete  MRSA PCR Screening     Status: None   Collection Time: 06/30/20  9:08 AM   Specimen: Nasal Mucosa; Nasopharyngeal  Result Value Ref Range Status   MRSA by PCR NEGATIVE NEGATIVE Final    Comment:        The GeneXpert MRSA Assay (FDA approved for NASAL specimens only), is one component of a comprehensive MRSA colonization surveillance program. It is not intended to diagnose MRSA infection nor to guide or monitor treatment for MRSA infections. Performed at [facility], [address] [place], [place] [address]  C Difficile Quick Screen w PCR reflex     Status: None   Collection Time: 07/01/20 10:49 PM   Specimen: Stool  Result Value Ref Range Status   C Diff antigen NEGATIVE NEGATIVE Final   C Diff toxin NEGATIVE NEGATIVE Final   C Diff interpretation No C. difficile detected.  Final    Comment: Performed at [facility], [address], [place], [place] [address]  Gastrointestinal Panel by PCR , Stool     Status: Abnormal   Collection Time: 07/01/20 10:49 PM   Specimen: Stool  Result Value Ref Range Status   Campylobacter species NOT DETECTED NOT DETECTED Final   Plesimonas shigelloides NOT DETECTED NOT DETECTED Final   Salmonella species NOT DETECTED NOT DETECTED Final   Yersinia enterocolitica  NOT DETECTED NOT DETECTED Final   Vibrio species NOT DETECTED NOT DETECTED Final   Vibrio cholerae NOT DETECTED NOT DETECTED Final   Enteroaggregative E coli (EAEC) NOT DETECTED NOT DETECTED Final   Enteropathogenic E coli (EPEC) DETECTED (A) NOT DETECTED Final    Comment: CRITICAL RESULT CALLED TO, READ BACK BY AND VERIFIED WITH: EMILY WRIGHT 07/02/20 1511 KLW    Enterotoxigenic E coli (ETEC) NOT DETECTED NOT DETECTED Final   Shiga like toxin producing E coli (STEC) NOT DETECTED NOT DETECTED Final   Shigella/Enteroinvasive E coli (EIEC) NOT DETECTED NOT DETECTED Final   Cryptosporidium NOT DETECTED NOT DETECTED Final   Cyclospora cayetanensis NOT DETECTED NOT DETECTED Final   Entamoeba histolytica NOT DETECTED NOT DETECTED Final   Giardia lamblia NOT DETECTED NOT DETECTED Final   Adenovirus F40/41 NOT DETECTED NOT DETECTED Final   Astrovirus NOT DETECTED NOT DETECTED Final   Norovirus GI/GII NOT DETECTED NOT DETECTED Final   Rotavirus A NOT DETECTED NOT DETECTED Final   Sapovirus (I, II, IV, and V) NOT DETECTED NOT DETECTED Final    Comment: Performed at [facility], [address] [place], [place] [address]  Culture, blood (Routine X 2) w Reflex to ID Panel     Status: None (Preliminary result)   Collection Time: 07/02/20  4:33 PM   Specimen: BLOOD RIGHT HAND  Result Value Ref Range Status   Specimen Description BLOOD RIGHT HAND  Final   Special Requests   Final    BOTTLES DRAWN AEROBIC AND ANAEROBIC Blood Culture adequate volume   Culture   Final    NO GROWTH 2 DAYS Performed at [facility], [address], [place], [place] [address]    Report Status PENDING  Incomplete  Culture, blood (Routine X 2) w Reflex to ID Panel     Status: None (Preliminary result)   Collection Time: 07/02/20  4:33 PM   Specimen: BLOOD RIGHT HAND  Result Value Ref Range Status   Specimen Description BLOOD RIGHT HAND  Final   Special Requests   Final    BOTTLES DRAWN AEROBIC AND ANAEROBIC  Blood Culture adequate volume   Culture   Final    NO GROWTH 2 DAYS Performed at [facility], [address], [place], [place] [address]    Report Status PENDING  Incomplete       Today   Subjective    Pernella Ackerley today has no new complaints -Diarrhea mostly resolved No fever  Or chills   No Nausea, Vomiting or Diarrhea    Patient has been seen and examined prior to discharge   Objective   Blood pressure (!) 96/59, pulse 83, temperature 98 F (36.7 C), temperature source Oral, resp.  rate 16, height _0  (1.651 m), weight (!) 136.7 kg, SpO2 96 %.   Intake/Output Summary (Last 24 hours) at 07/04/2020 1541 Last data filed at 07/04/2020 0900 Gross per 24 hour  Intake 480 ml  Output --  Net 480 ml    Exam Gen:- Awake Alert, morbidly obese HEENT:- [place].AT, No sclera icterus Neck-Supple Neck,No JVD,.  Lungs-  CTAB , fair symmetrical air movement CV- S1, S2 normal, regular  Abd-  +ve B.Sounds, Abd Soft, No tenderness, increased truncal adiposity Extremity/Skin:-Chronic lymphedema, pedal pulses present  Psych-affect is appropriate, oriented x3 Neuro-generalized weakness ,no new focal deficits, no tremors MSK-left arm AV fistula with positive thrill and bruit   Data Review   CBC w Diff:  Lab Results  Component Value Date   WBC 8.5 07/04/2020   HGB 11.1 (L) 07/04/2020   HCT 35.5 (L) 07/04/2020   PLT 195 07/04/2020   LYMPHOPCT 20 07/04/2020   BANDSPCT 1 08/30/2017   MONOPCT 7 07/04/2020   EOSPCT 5 07/04/2020   BASOPCT 1 07/04/2020    CMP:  Lab Results  Component Value Date   NA 136 07/04/2020   NA 135 07/04/2020   K 4.9 07/04/2020   K 4.9 07/04/2020   CL 95 (L) 07/04/2020   CL 95 (L) 07/04/2020   CO2 23 07/04/2020   CO2 25 07/04/2020   BUN 51 (H) 07/04/2020   BUN 52 (H) 07/04/2020   CREATININE 9.27 (H) 07/04/2020   CREATININE 9.25 (H) 07/04/2020   PROT 6.9 07/04/2020   ALBUMIN 3.2 (L) 07/04/2020   ALBUMIN 3.2 (L) 07/04/2020   BILITOT 0.7  07/04/2020   ALKPHOS 191 (H) 07/04/2020   AST 17 07/04/2020   ALT 11 07/04/2020  .   Total Discharge time is about 33 minutes  Roxan Hockey M.D on 07/04/2020 at 3:41 PM  Go to [email or web address] -  for contact info  [facility] - Office  [phone]

## 2020-07-04 NOTE — Procedures (Signed)
    HEMODIALYSIS TREATMENT NOTE:   3.5 hour heparin-free HD completed via left forearm AVF (15g/antegrade).  Tolerated removal of 2003cc.  UF was interrupted x last 30 minutes for soft BPs.  All blood was returned and hemostasis was achieved in 10 minutes.  Post weight 0.6kg under EDW.   No change from pre-HD assessment.  Rockwell Alexandria, RN

## 2020-07-05 LAB — CULTURE, BLOOD (ROUTINE X 2): Culture: NO GROWTH

## 2020-07-07 LAB — CULTURE, BLOOD (ROUTINE X 2)
Culture: NO GROWTH
Culture: NO GROWTH
Special Requests: ADEQUATE
Special Requests: ADEQUATE

## 2020-11-04 ENCOUNTER — Ambulatory Visit (INDEPENDENT_AMBULATORY_CARE_PROVIDER_SITE_OTHER): Payer: Medicare Other | Admitting: Nurse Practitioner

## 2020-11-04 ENCOUNTER — Other Ambulatory Visit: Payer: Self-pay

## 2020-11-04 VITALS — BP 138/82 | HR 80 | Temp 98.2°F | Resp 20 | Ht 65.0 in | Wt 297.0 lb

## 2020-11-04 DIAGNOSIS — R269 Unspecified abnormalities of gait and mobility: Secondary | ICD-10-CM

## 2020-11-04 DIAGNOSIS — D631 Anemia in chronic kidney disease: Secondary | ICD-10-CM

## 2020-11-04 DIAGNOSIS — I120 Hypertensive chronic kidney disease with stage 5 chronic kidney disease or end stage renal disease: Secondary | ICD-10-CM

## 2020-11-04 DIAGNOSIS — E1122 Type 2 diabetes mellitus with diabetic chronic kidney disease: Secondary | ICD-10-CM

## 2020-11-04 DIAGNOSIS — I890 Lymphedema, not elsewhere classified: Secondary | ICD-10-CM

## 2020-11-04 DIAGNOSIS — I5030 Unspecified diastolic (congestive) heart failure: Secondary | ICD-10-CM

## 2020-11-04 DIAGNOSIS — N186 End stage renal disease: Secondary | ICD-10-CM

## 2020-11-04 DIAGNOSIS — Z992 Dependence on renal dialysis: Secondary | ICD-10-CM

## 2020-11-04 DIAGNOSIS — E890 Postprocedural hypothyroidism: Secondary | ICD-10-CM

## 2020-11-04 DIAGNOSIS — F419 Anxiety disorder, unspecified: Secondary | ICD-10-CM

## 2020-11-04 DIAGNOSIS — E1165 Type 2 diabetes mellitus with hyperglycemia: Secondary | ICD-10-CM

## 2020-11-04 DIAGNOSIS — F330 Major depressive disorder, recurrent, mild: Secondary | ICD-10-CM

## 2020-11-04 NOTE — Patient Instructions (Signed)
End Stage Renal Disease -continue your dialysis regimen per your nephrologist -continue to lose weight and eat well according to your renal diet  High Blood Pressure -doing well here; with weight loss and lifestyle cahnges you haven't needed your midodrine lately -if low blood pressures develop we can start this again  Diabetes -your lifestyle changes have helped bring your A1c to goal, so you haven't been taking insulin lately -since you had a recent A1c, we will recheck this in 3 months  Follow-up in 3 months for lab work, and we will have an appointment shortly after that to discuss results

## 2020-11-04 NOTE — Progress Notes (Signed)
New Patient Office Visit  Subjective:  Patient ID: [name], female    DOB: 06/18/1966  Age: 54 y.o. MRN: [id number]  CC:  Chief Complaint  Patient presents with  . New Patient (Initial Visit)  . Otalgia    hearing loss in R ear.     HPI [name] presents for new patient visit.  She is transferring care from Dr. Coralyn Mark with [facility] and Dr. [name] prior to that. Last physical and labs were completed about a month ago.  She states she gets labs at dialysis once or twice per month.  She has Hemodialysis MWF at [facility] with Dr. Juanda Crumble.  Past Medical History:  Diagnosis Date  . Allergy    Phreesia 11/04/2020  . Anemia, unspecified   . Anxiety   . Arthritis    Phreesia 11/04/2020  . Asthma    as a child  . Cancer ([facility])    thyroid  . Cellulitis   . CHF (congestive heart failure) ([place])   . Chiari malformation    s/p surgery  . Chiari malformation   . Chronic kidney disease    Phreesia 11/04/2020  . Chronic kidney disease, stage 5 ([place])   . Chronic obstructive pulmonary disease, unspecified ([place])   . Chronic pain   . Complication of anesthesia 11/28/15   Resp arrest after  conscious  sedation  . Compression of brain ([place])   . Constipation   . COPD (chronic obstructive pulmonary disease) ([place])    Phreesia 11/04/2020  . Coronary artery disease    40-50% mid LAD 04/29/09, Medical tx. (Dr. Gwenlyn Found)  . Diabetes mellitus    Type II- reports being off all medication d/t it being controlled  . Diabetes mellitus without complication ([place])    Phreesia 11/04/2020  . Essential (primary) hypertension   . Fibromyalgia   . Fibromyalgia   . History of blood transfusion    hemorrage duinrg pregancy  . Hx of echocardiogram 10/2011   EF 55-60%  . Hypercholesterolemia   . Hyperlipidemia, unspecified   . Hypertension   . Hypertensive chronic kidney disease with stage 1 through stage 4 chronic kidney disease, or unspecified chronic kidney disease   . Hypertensive  chronic kidney disease with stage 5 chronic kidney disease or end stage renal disease ([place])   . Lymph edema   . Lymphedema, not elsewhere classified   . Morbid (severe) obesity due to excess calories ([place])   . Obesity hypoventilation syndrome ([place])   . On home oxygen therapy    "2L; 24/7" (12/21/2018)  . Peritonitis, unspecified ([place])   . Pneumonia    in past  . Pulmonary hypertension ([place])   . Renal disorder    M/W/F [facility] [place] Pt started dialysis in Dec.2016  . S/P colonoscopy 05/26/2007   Dr. Laural Golden sigmoid diverticulosis random biopsies benign  . S/P endoscopy 05/01/2009   Dr. Penelope Coop pill-induced esophageal ulcerations distal to midesophagus, 2 small ulcers in the antrum of the stomach  . Shortness of breath dyspnea    with any exertion or if heart rate  is irregular while on dialysis  . Sleep apnea    reports that she no longer needs CPAP due to weight loss  . Sleep apnea, unspecified   . Type 2 diabetes mellitus with diabetic nephropathy ([place])   . Type 2 diabetes mellitus with hyperglycemia [facility])     Past Surgical History:  Procedure Laterality Date  . Marland KitchenHemodialysis catheter Right 11/28/2015  . A/V FISTULAGRAM N/A  07/26/2017   Procedure: A/V Fistulagram - Left Arm;  Surgeon: Serafina Mitchell, MD;  Location: [place] [facility];  Service: Cardiovascular;  Laterality: N/A;  . ABDOMINAL HYSTERECTOMY     total hysterectomy  . ADENOIDECTOMY    . AV FISTULA PLACEMENT Left 03/15/2016   Procedure: CREATION OF LEFT ARM ARTERIOVENOUS (AV) FISTULA  ;  Surgeon: Rosetta Posner, MD;  Location: [place];  Service: Vascular;  Laterality: Left;  . CAPD INSERTION N/A 08/30/2017   Procedure: LAPAROSCOPIC INSERTION CONTINUOUS AMBULATORY PERITONEAL DIALYSIS  (CAPD) CATHETER;  Surgeon: Clovis Riley, MD;  Location: [place];  Service: General;  Laterality: N/A;  . CESAREAN SECTION      x 2  . CESAREAN SECTION N/A    Phreesia 11/04/2020  . CRANIECTOMY SUBOCCIPITAL W/ CERVICAL LAMINECTOMY / CHIARI     . FISTULA SUPERFICIALIZATION Left 05/10/2016   Procedure: Left Arm FISTULA SUPERFICIALIZATION;  Surgeon: Rosetta Posner, MD;  [facility];  Service: Vascular;  Laterality: Left;  . IR GENERIC HISTORICAL  08/14/2016   IR REMOVAL TUN ACCESS W/ PORT W/O FL MOD SED 08/14/2016 Arne Cleveland, MD [facility]-INTERV RAD  . IR SINUS/FIST TUBE CHK-NON GI  01/01/2019  . IR US GUIDE BX ASP/DRAIN  12/28/2018  . MINOR REMOVAL OF PERITONEAL DIALYSIS CATHETER N/A 04/26/2019   Procedure: REMOVAL OF INFECTED PERITONEAL DIALYSIS CATHETER;  Surgeon: Erroll Luna, MD;  Location: [place];  Service: General;  Laterality: N/A;  . PERIPHERAL VASCULAR BALLOON ANGIOPLASTY Left 10/18/2019   Procedure: PERIPHERAL VASCULAR BALLOON ANGIOPLASTY;  Surgeon: Marty Heck, MD;  Location: [place] [facility];  Service: Cardiovascular;  Laterality: Left;  arm fistula  . PERIPHERAL VASCULAR CATHETERIZATION Left 11/25/2016   Procedure: A/V Fistulagram;  Surgeon: Conrad Arcola, MD;  Location: [place] [facility];  Service: Cardiovascular;  Laterality: Left;  arm  . PORTACATH PLACEMENT  07/05/2012   Procedure: INSERTION PORT-A-CATH;  Surgeon: Donato Heinz, MD;  Location: [facility];  Service: General;  Laterality: Left;  subclavian  . portacath removal    . RIGHT HEART CATH N/A 08/04/2017   Procedure: RIGHT HEART CATH;  Surgeon: Jolaine Artist, MD;  Location: [place] [facility];  Service: Cardiovascular;  Laterality: N/A;  . THYROIDECTOMY, PARTIAL    . TONSILLECTOMY      Family History  Problem Relation Age of Onset  . Colon cancer Mother 36  . Stroke Mother 76  . Coronary artery disease Mother     Social History   Socioeconomic History  . Marital status: Married    Spouse name: Not on file  . Number of children: 3  . Years of education: Not on file  . Highest education level: Not on file  Occupational History  . Not on file  Tobacco Use  . Smoking status: Never Smoker  . Smokeless tobacco: Never Used  Vaping Use  .  Vaping Use: Never used  Substance and Sexual Activity  . Alcohol use: No    Alcohol/week: 0.0 standard drinks  . Drug use: No  . Sexual activity: Not Currently  Other Topics Concern  . 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 Difficulty of Paying Living Expenses: Not on file  Food Insecurity:   . Worried About Charity fundraiser in the Last Year: Not on file  . Ran Out of Food in the Last Year: Not  on file  Transportation Needs:   . Lack of Transportation (Medical): Not on file  . Lack of Transportation (Non-Medical): Not on file  Physical Activity:   . Days of Exercise per Week: Not on file  . Minutes of Exercise per Session: Not on file  Stress:   . Feeling of Stress : Not on file  Social Connections:   . Frequency of Communication with Friends and Family: Not on file  . Frequency of Social Gatherings with Friends and Family: Not on file  . Attends Religious Services: Not on file  . Active Member of Clubs or Organizations: Not on file  . Attends Archivist Meetings: Not on file  . Marital Status: Not on file  Intimate Partner Violence:   . Fear of Current or Ex-Partner: Not on file  . Emotionally Abused: Not on file  . Physically Abused: Not on file  . Sexually Abused: Not on file    ROS Review of Systems  Constitutional: Positive for fatigue. Negative for fever.       Gets fatigue after her hemodialysis, and this is her baseline  HENT: Positive for hearing loss. Negative for sinus pressure, sinus pain and sore throat.        Left hearing loss that comes and goes, gets a popping sensation at times.  Respiratory: Negative for cough, shortness of breath and wheezing.   Cardiovascular: Positive for leg swelling. Negative for chest pain and palpitations.       Has baseline lymphedema in bilateral lower legs  Endocrine: Negative for polydipsia, polyphagia and  polyuria.  Psychiatric/Behavioral: Negative for dysphoric mood, self-injury and suicidal ideas. The patient is not nervous/anxious.     Objective:   Today's Vitals: BP 138/82   Pulse 80   Temp 98.2 F (36.8 C)   Resp 20   Ht 5' 5" (1.651 m)   Wt 297 lb (134.7 kg)   SpO2 93%   BMI 49.42 kg/m   Physical Exam Constitutional:      Appearance: She is obese.  HENT:     Right Ear: Tympanic membrane and ear canal normal.     Left Ear: Tympanic membrane and ear canal normal.  Cardiovascular:     Rate and Rhythm: Normal rate and regular rhythm.     Heart sounds: No murmur heard.  No friction rub.     Comments: 2+ pitting edema to bilateral lower legs Pulmonary:     Effort: Pulmonary effort is normal.     Breath sounds: Normal breath sounds.  Musculoskeletal:     Comments: Uses powered wheelchair for ambulation   Neurological:     General: No focal deficit present.     Mental Status: She is alert and oriented to person, place, and time.  Psychiatric:        Mood and Affect: Mood normal.        Behavior: Behavior normal.     Assessment & Plan:   Problem List Items Addressed This Visit      Other   Lymphedema of lower extremity    Other Visit Diagnoses    End stage renal disease on dialysis due to type 2 diabetes mellitus ([place])    -  Primary   Relevant Orders   CBC with Differential/Platelet   CMP14+EGFR   Hypertensive kidney disease with end-stage renal disease ([place])       Anemia in end-stage renal disease ([place])       Heart failure with preserved ejection fraction, unspecified  HF chronicity ([place])       Uncontrolled type 2 diabetes mellitus with hyperglycemia ([facility])       Relevant Orders   Hemoglobin A1c   Lipid Panel With LDL/HDL Ratio   Anxiety       Mild recurrent major depression ([facility])       Gait abnormality       Postoperative hypothyroidism       Relevant Orders   TSH + free T4     ESRD- continue HD Monday Wednesdays and Fridays, nephrology consult  appreciated -Tolerated HD well   -takes lanthanum carbonate for phosphate binder  HTN- she no longer is taking midodrine prior to dialysis because her blood pressure is much better regulated -well controlled today  chronic anemia of CKD--- ESA/Procrit per nephrology team -followed by [facility]/nephrology  Chronic lymphedema--- supportive care,continue to use hemodialysis to address volume status -left leg swells more than right -she used to use compression hose, and her legs got bigger and she doesn't use compression wraps any more  HFpEF--stable, appears euvolemic, continue to use HD to address volume status -she states that she has no issues form this and does not see cardioogy  DM2- -she states that she doesn't take meds for this after losing weight -she states her A1c was drawn about a month ago -was taking levemir and humalog, but has not required these recently  Depression and anxiety--- on Zoloft and alprazolam  Constipation -uses lactulose 30g/ 45 mL daily for this  Diarrhea -takes loperamide (iimodium) PRN for loose stools  Insomnia -takes melatonin qhs -sleeps about 4 hours per night, but naps during the day  Gait/Mobility Issues -had cellulitis in legs and she had significant deconditioning -she had PT, but still has weakness -uses walker at home, but uses power chair ([employer]) when going out in public  Hypothyroidism -has hx of thyroid cancer, had partial thyroidectomy -she states she lost over 100 pounds intentionally -will check thyroid panel with next set of labs   Outpatient Encounter Medications as of 11/04/2020  Medication Sig  . acetaminophen (TYLENOL) 325 MG tablet Take 2 tablets (650 mg total) by mouth every 4 (four) hours as needed for mild pain (or Fever >/= 101).  . diphenhydrAMINE (BENADRYL) 25 mg capsule Take 25 mg by mouth daily as needed for allergies.   Marland Kitchen lactulose (CHRONULAC) 10 GM/15ML solution Take 45 mLs (30 g total) by mouth 2  (two) times daily. For constipation  . lanthanum (FOSRENOL) 500 MG chewable tablet Chew 1,000-1,500 mg by mouth 5 (five) times daily. 1500 mg by mouth three times a day with meals and 1000 mg by mouth twice a day with snacks  . loperamide (IMODIUM) 2 MG capsule Take 2-4 mg by mouth as needed for diarrhea or loose stools.   . Melatonin 5 MG CAPS Take 5 mg by mouth at bedtime as needed (sleep).  . midodrine (PROAMATINE) 10 MG tablet Take 1 tablet (10 mg total) by mouth 2 (two) times daily with a meal. --Always take it (do Not hold it)  before hemodialysis to avoid low blood pressure  . multivitamin (RENA-VIT) TABS tablet Take 1 tablet by mouth daily.  Marland Kitchen albuterol (PROVENTIL) (2.5 MG/3ML) 0.083% nebulizer solution Take 2.5 mg by nebulization every 6 (six) hours as needed for wheezing or shortness of breath. (Patient not taking: Reported on 11/04/2020)  . albuterol (VENTOLIN HFA) 108 (90 Base) MCG/ACT inhaler Inhale 2 puffs into the lungs 4 (four) times daily. (Patient not taking: Reported on 11/04/2020)  .  ALPRAZolam (XANAX) 0.5 MG tablet Take 0.5 mg by mouth 2 (two) times daily as needed for anxiety. (Patient not taking: Reported on 11/04/2020)  . amLODipine (NORVASC) 2.5 MG tablet Take 1 tablet (2.5 mg total) by mouth daily. Hold it on your hemodialysis- (Patient not taking: Reported on 11/04/2020)  . insulin detemir (LEVEMIR) 100 UNIT/ML injection Inject 0.03 mLs (3 Units total) into the skin daily. (Patient not taking: Reported on 11/04/2020)  . nystatin (NYSTATIN) powder Apply 1 application topically 2 (two) times daily. (Patient not taking: Reported on 11/04/2020)  . Oxycodone HCl 10 MG TABS Take 1 tablet (10 mg total) by mouth 4 (four) times daily as needed (pain). (Patient not taking: Reported on 11/04/2020)  . sertraline (ZOLOFT) 50 MG tablet Take 50 mg by mouth daily. (Patient not taking: Reported on 11/04/2020)  . [DISCONTINUED] insulin lispro (HUMALOG) 100 UNIT/ML injection Inject 60 Units  into the skin 2 (two) times daily.     No facility-administered encounter medications on file as of 11/04/2020.    Follow-up: Return in about 3 months (around 02/04/2021) for Managment of Chronic Conditions.  Please check labs within a week of your next appointment.Noreene Larsson, NP

## 2021-02-03 ENCOUNTER — Other Ambulatory Visit: Payer: Self-pay

## 2021-02-03 ENCOUNTER — Telehealth (INDEPENDENT_AMBULATORY_CARE_PROVIDER_SITE_OTHER): Payer: 59 | Admitting: Nurse Practitioner

## 2021-02-03 ENCOUNTER — Encounter: Payer: Self-pay | Admitting: Nurse Practitioner

## 2021-02-03 DIAGNOSIS — U071 COVID-19: Secondary | ICD-10-CM | POA: Diagnosis not present

## 2021-02-03 MED ORDER — MOLNUPIRAVIR 200 MG PO CAPS: 800.0000 mg | ORAL_CAPSULE | Freq: Two times a day (BID) | ORAL | 0 refills | Status: DC

## 2021-02-03 MED ORDER — GUAIFENESIN-DM 100-10 MG/5ML PO SYRP
15.0000 mL | ORAL_SOLUTION | Freq: Four times a day (QID) | ORAL | 1 refills | Status: DC | PRN
Start: 2021-02-03 — End: 2021-06-21

## 2021-02-03 NOTE — Progress Notes (Signed)
Acute Office Visit  Subjective:    Patient ID: [name], female    DOB: May 28, 1966, 55 y.o.   MRN: [id number]  Chief Complaint  Patient presents with  . Covid Positive    Cough, body aches, loss of appetite, vomiting. X 1 week     HPI Patient is in today for COVID.  She tested positive over a week ago. Symptoms started around that time. She had a nose swab at dialysis, and that came back positive "a few days later".  Past Medical History:  Diagnosis Date  . Allergy    Phreesia 11/04/2020  . Anemia, unspecified   . Anxiety   . Arthritis    Phreesia 11/04/2020  . Asthma    as a child  . Cancer ([place])    thyroid  . Cellulitis   . CHF (congestive heart failure) ([place])   . Chiari malformation    s/p surgery  . Chiari malformation   . Chronic kidney disease    Phreesia 11/04/2020  . Chronic kidney disease, stage 5 ([place])   . Chronic obstructive pulmonary disease, unspecified ([place])   . Chronic pain   . Complication of anesthesia 11/28/15   Resp arrest after  conscious  sedation  . Compression of brain ([place])   . Constipation   . COPD (chronic obstructive pulmonary disease) ([place])    Phreesia 11/04/2020  . Coronary artery disease    40-50% mid LAD 04/29/09, Medical tx. (Dr. Gwenlyn Found)  . Diabetes mellitus    Type II- reports being off all medication d/t it being controlled  . Diabetes mellitus without complication ([place])    Phreesia 11/04/2020  . Essential (primary) hypertension   . Fibromyalgia   . Fibromyalgia   . History of blood transfusion    hemorrage duinrg pregancy  . Hx of echocardiogram 10/2011   EF 55-60%  . Hypercholesterolemia   . Hyperlipidemia, unspecified   . Hypertension   . Hypertensive chronic kidney disease with stage 1 through stage 4 chronic kidney disease, or unspecified chronic kidney disease   . Hypertensive chronic kidney disease with stage 5 chronic kidney disease or end stage renal disease ([place])   . Lymph edema   . Lymphedema, not  elsewhere classified   . Morbid (severe) obesity due to excess calories ([place])   . Obesity hypoventilation syndrome ([place])   . On home oxygen therapy    "2L; 24/7" (12/21/2018)  . Peritonitis, unspecified ([place])   . Pneumonia    in past  . Pulmonary hypertension ([place])   . Renal disorder    M/W/F [facility] Pt started dialysis in Dec.2016  . S/P colonoscopy 05/26/2007   Dr. Laural Golden sigmoid diverticulosis random biopsies benign  . S/P endoscopy 05/01/2009   Dr. Penelope Coop pill-induced esophageal ulcerations distal to midesophagus, 2 small ulcers in the antrum of the stomach  . Shortness of breath dyspnea    with any exertion or if heart rate  is irregular while on dialysis  . Sleep apnea    reports that she no longer needs CPAP due to weight loss  . Sleep apnea, unspecified   . Type 2 diabetes mellitus with diabetic nephropathy ([place])   . Type 2 diabetes mellitus with hyperglycemia [facility])     Past Surgical History:  Procedure Laterality Date  . Marland KitchenHemodialysis catheter Right 11/28/2015  . A/V FISTULAGRAM N/A 07/26/2017   Procedure: A/V Fistulagram - Left Arm;  Surgeon: Serafina Mitchell, MD;  Location: [place] [facility];  Service: Cardiovascular;  Laterality: N/A;  . ABDOMINAL HYSTERECTOMY     total hysterectomy  . ADENOIDECTOMY    . AV FISTULA PLACEMENT Left 03/15/2016   Procedure: CREATION OF LEFT ARM ARTERIOVENOUS (AV) FISTULA  ;  Surgeon: Rosetta Posner, MD;  Location: [place];  Service: Vascular;  Laterality: Left;  . CAPD INSERTION N/A 08/30/2017   Procedure: LAPAROSCOPIC INSERTION CONTINUOUS AMBULATORY PERITONEAL DIALYSIS  (CAPD) CATHETER;  Surgeon: Clovis Riley, MD;  Location: [place];  Service: General;  Laterality: N/A;  . CESAREAN SECTION      x 2  . CESAREAN SECTION N/A    Phreesia 11/04/2020  . CRANIECTOMY SUBOCCIPITAL W/ CERVICAL LAMINECTOMY / CHIARI    . FISTULA SUPERFICIALIZATION Left 05/10/2016   Procedure: Left Arm FISTULA SUPERFICIALIZATION;  Surgeon: Rosetta Posner, MD;   [facility];  Service: Vascular;  Laterality: Left;  . IR GENERIC HISTORICAL  08/14/2016   IR REMOVAL TUN ACCESS W/ PORT W/O FL MOD SED 08/14/2016 Arne Cleveland, MD [facility]-INTERV RAD  . IR SINUS/FIST TUBE CHK-NON GI  01/01/2019  . IR US GUIDE BX ASP/DRAIN  12/28/2018  . MINOR REMOVAL OF PERITONEAL DIALYSIS CATHETER N/A 04/26/2019   Procedure: REMOVAL OF INFECTED PERITONEAL DIALYSIS CATHETER;  Surgeon: Erroll Luna, MD;  Location: [place];  Service: General;  Laterality: N/A;  . PERIPHERAL VASCULAR BALLOON ANGIOPLASTY Left 10/18/2019   Procedure: PERIPHERAL VASCULAR BALLOON ANGIOPLASTY;  Surgeon: Marty Heck, MD;  Location: [place] [facility];  Service: Cardiovascular;  Laterality: Left;  arm fistula  . PERIPHERAL VASCULAR CATHETERIZATION Left 11/25/2016   Procedure: A/V Fistulagram;  Surgeon: Conrad Napakiak, MD;  Location: [place] [facility];  Service: Cardiovascular;  Laterality: Left;  arm  . PORTACATH PLACEMENT  07/05/2012   Procedure: INSERTION PORT-A-CATH;  Surgeon: Donato Heinz, MD;  Location: [facility];  Service: General;  Laterality: Left;  subclavian  . portacath removal    . RIGHT HEART CATH N/A 08/04/2017   Procedure: RIGHT HEART CATH;  Surgeon: Jolaine Artist, MD;  Location: [place] [facility];  Service: Cardiovascular;  Laterality: N/A;  . THYROIDECTOMY, PARTIAL    . TONSILLECTOMY      Family History  Problem Relation Age of Onset  . Colon cancer Mother 39  . Stroke Mother 25  . Coronary artery disease Mother     Social History   Socioeconomic History  . Marital status: Married    Spouse name: Not on file  . Number of children: 3  . Years of education: Not on file  . Highest education level: Not on file  Occupational History  . Not on file  Tobacco Use  . Smoking status: Never Smoker  . Smokeless tobacco: Never Used  Vaping Use  . Vaping Use: Never used  Substance and Sexual Activity  . Alcohol use: No    Alcohol/week: 0.0 standard drinks  . Drug use: No   . Sexual activity: Not Currently  Other Topics Concern  . 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 acetaminophen (TYLENOL) 325 MG tablet Take 2 tablets (650 mg total) by  mouth every 4 (four) hours as needed for mild pain (or Fever >/= 101). 15 tablet 1  . albuterol (PROVENTIL) (2.5 MG/3ML) 0.083% nebulizer solution Take 2.5 mg by nebulization every 6 (six) hours as needed for wheezing or shortness of breath.    Marland Kitchen albuterol (VENTOLIN HFA) 108 (90 Base) MCG/ACT inhaler Inhale 2 puffs into the lungs 4 (four) times daily.    Marland Kitchen ALPRAZolam (XANAX) 0.5 MG tablet Take 0.5 mg by mouth 2 (two) times daily as needed for anxiety.    Marland Kitchen amLODipine (NORVASC) 2.5 MG tablet Take 1 tablet (2.5 mg total) by mouth daily. Hold it on your hemodialysis- 30 tablet 2  . diphenhydrAMINE (BENADRYL) 25 mg capsule Take 25 mg by mouth daily as needed for allergies.     Marland Kitchen insulin detemir (LEVEMIR) 100 UNIT/ML injection Inject 0.03 mLs (3 Units total) into the skin daily. 10 mL 11  . lactulose (CHRONULAC) 10 GM/15ML solution Take 45 mLs (30 g total) by mouth 2 (two) times daily. For constipation 236 mL 0  . lanthanum (FOSRENOL) 500 MG chewable tablet Chew 1,000-1,500 mg by mouth 5 (five) times daily. 1500 mg by mouth three times a day with meals and 1000 mg by mouth twice a day with snacks    . loperamide (IMODIUM) 2 MG capsule Take 2-4 mg by mouth as needed for diarrhea or loose stools.     . Melatonin 5 MG CAPS Take 5 mg by mouth at bedtime as needed (sleep).    . midodrine (PROAMATINE) 10 MG tablet Take 1 tablet (10 mg total) by mouth 2 (two) times  daily with a meal. --Always take it (do Not hold it)  before hemodialysis to avoid low blood pressure 60 tablet 3  . multivitamin (RENA-VIT) TABS tablet Take 1 tablet by mouth daily.  11  . nystatin (MYCOSTATIN/NYSTOP) powder Apply 1 application topically 2 (two) times daily.    . Oxycodone HCl 10 MG TABS Take 1 tablet (10 mg total) by mouth 4 (four) times daily as needed (pain).    Marland Kitchen sertraline (ZOLOFT) 50 MG tablet Take 50 mg by mouth daily.     No facility-administered medications prior to visit.    Allergies  Allergen Reactions  . Contrast Media [Iodinated Diagnostic Agents] Anaphylaxis, Hives, Swelling and Other (See Comments)    Dye for cardiac cath. Tongue swells  . Pneumococcal Vaccines Swelling and Other (See Comments)    Turns skin black, and bodily swelling  . Vancomycin Nausea And Vomiting and Other (See Comments)    Infusion "made me feel like I was dying" had to be readmitted to hospital    Review of Systems  Constitutional: Positive for chills and fatigue. Negative for fever.  HENT: Positive for congestion, postnasal drip, rhinorrhea, sinus pressure and sore throat. Negative for sinus pain.   Respiratory: Positive for cough, shortness of breath and wheezing.   Musculoskeletal: Positive for myalgias.       Objective:    Physical Exam  There were no vitals taken for this visit. Wt Readings from Last 3 Encounters:  11/04/20 297 lb (134.7 kg)  07/04/20 296 lb 4.8 oz (134.4 kg)  10/18/19 295 lb 6.7 oz (134 kg)    Health Maintenance Due  Topic Date Due  . PNEUMOCOCCAL POLYSACCHARIDE VACCINE AGE 9-64 HIGH RISK  Never done  . COVID-19 Vaccine (1) Never done  . FOOT EXAM  Never done  . OPHTHALMOLOGY EXAM  Never done  . PAP SMEAR-Modifier  Never done  .  COLONOSCOPY (Pts 45-35yr Insurance coverage will need to be confirmed)  Never done  . URINE MICROALBUMIN  08/30/2015  . MAMMOGRAM  Never done  . INFLUENZA VACCINE  07/20/2020  . HEMOGLOBIN A1C  12/31/2020     There are no preventive care reminders to display for this patient.   Lab Results  Component Value Date   TSH 1.402 06/06/2016   Lab Results  Component Value Date   WBC 8.5 07/04/2020   HGB 11.1 (L) 07/04/2020   HCT 35.5 (L) 07/04/2020   MCV 101.4 (H) 07/04/2020   PLT 195 07/04/2020   Lab Results  Component Value Date   NA 136 07/04/2020   NA 135 07/04/2020   K 4.9 07/04/2020   K 4.9 07/04/2020   CO2 23 07/04/2020   CO2 25 07/04/2020   GLUCOSE 128 (H) 07/04/2020   GLUCOSE 128 (H) 07/04/2020   BUN 51 (H) 07/04/2020   BUN 52 (H) 07/04/2020   CREATININE 9.27 (H) 07/04/2020   CREATININE 9.25 (H) 07/04/2020   BILITOT 0.7 07/04/2020   ALKPHOS 191 (H) 07/04/2020   AST 17 07/04/2020   ALT 11 07/04/2020   PROT 6.9 07/04/2020   ALBUMIN 3.2 (L) 07/04/2020   ALBUMIN 3.2 (L) 07/04/2020   CALCIUM 8.8 (L) 07/04/2020   CALCIUM 8.7 (L) 07/04/2020   ANIONGAP 18 (H) 07/04/2020   ANIONGAP 15 07/04/2020   Lab Results  Component Value Date   CHOL  04/29/2009    167        ATP III CLASSIFICATION:  <200     mg/dL   Desirable  200-239  mg/dL   Borderline High  >=240    mg/dL   High          Lab Results  Component Value Date   HDL 37 (L) 04/29/2009   Lab Results  Component Value Date   LDLCALC  04/29/2009    86        Total Cholesterol/HDL:CHD Risk Coronary Heart Disease Risk Table                     Men   Women  1/2 Average Risk   3.4   3.3  Average Risk       5.0   4.4  2 X Average Risk   9.6   7.1  3 X Average Risk  23.4   11.0        Use the calculated Patient Ratio above and the CHD Risk Table to determine the patient's CHD Risk.        ATP III CLASSIFICATION (LDL):  <100     mg/dL   Optimal  100-129  mg/dL   Near or Above                    Optimal  130-159  mg/dL   Borderline  160-189  mg/dL   High  >190     mg/dL   Very High   Lab Results  Component Value Date   TRIG 136 06/30/2020   Lab Results  Component Value Date   CHOLHDL 4.5 04/29/2009    Lab Results  Component Value Date   HGBA1C 7.2 (H) 06/30/2020       Assessment & Plan:   Problem List Items Addressed This Visit      Other   COVID-19    -Rx. molnupriavir -Rx. guaifenesin       Relevant Medications   Molnupiravir 200 MG CAPS  Meds ordered this encounter  Medications  . guaiFENesin-dextromethorphan (ROBITUSSIN DM) 100-10 MG/5ML syrup    Sig: Take 15 mLs by mouth every 6 (six) hours as needed for cough.    Dispense:  118 mL    Refill:  1  . Molnupiravir 200 MG CAPS    Sig: Take 4 capsules (800 mg total) by mouth in the morning and at bedtime.    Dispense:  40 capsule    Refill:  0   Date:  02/03/2021   Location of Patient: Home Location of Provider: Office Consent was obtain for visit to be over via telehealth. I verified that I am speaking with the correct person using two identifiers.  I connected with  Earlean Shawl on 02/03/21 via telephone and verified that I am speaking with the correct person using two identifiers.   I discussed the limitations of evaluation and management by telemedicine. The patient expressed understanding and agreed to proceed.  Time spent: 10 min   Noreene Larsson, NP

## 2021-02-03 NOTE — Assessment & Plan Note (Signed)
-  Rx. molnupriavir -Rx. guaifenesin

## 2021-06-09 ENCOUNTER — Ambulatory Visit (INDEPENDENT_AMBULATORY_CARE_PROVIDER_SITE_OTHER): Payer: 59 | Admitting: Nurse Practitioner

## 2021-06-09 ENCOUNTER — Encounter: Payer: Self-pay | Admitting: Nurse Practitioner

## 2021-06-09 ENCOUNTER — Other Ambulatory Visit: Payer: Self-pay

## 2021-06-09 DIAGNOSIS — L03115 Cellulitis of right lower limb: Secondary | ICD-10-CM

## 2021-06-09 MED ORDER — CEPHALEXIN 500 MG PO CAPS: 500.0000 mg | ORAL_CAPSULE | Freq: Two times a day (BID) | ORAL | 0 refills | Status: DC

## 2021-06-09 NOTE — Assessment & Plan Note (Addendum)
-  to right upper leg; given location, less concerned for DVT, but if no improvement, will consider U/S -Rx. Keflex -if no improvement in a week or symptoms get worse, she will need a physical exam in the clinic

## 2021-06-09 NOTE — Progress Notes (Signed)
Acute Office Visit  Subjective:    Patient ID: [name], female    DOB: 1966/06/14, 55 y.o.   MRN: [id number]  Chief Complaint  Patient presents with   Leg Swelling    Swelling and redness in R upper leg, ongoing x2-3 days ago.    HPI Patient is in today for right upper leg swelling.  The swelling is above her knee. She states it is closer to her hip than her knee.  She states that she has had cellulite past.  She states that area feels hot to touch is red and is swollen.  She is on hemodialysis and she is at the HD center by 0600.    Past Medical History:  Diagnosis Date   Allergy    Phreesia 11/04/2020   Anemia, unspecified    Anxiety    Arthritis    Phreesia 11/04/2020   Asthma    as a child   Cancer ([place])    thyroid   Cellulitis    CHF (congestive heart failure) ([place])    Chiari malformation    s/p surgery   Chiari malformation    Chronic kidney disease    Phreesia 11/04/2020   Chronic kidney disease, stage 5 (HCC)    Chronic obstructive pulmonary disease, unspecified ([place])    Chronic pain    Complication of anesthesia 11/28/15   Resp arrest after  conscious  sedation   Compression of brain (HCC)    Constipation    COPD (chronic obstructive pulmonary disease) ([place])    Phreesia 11/04/2020   Coronary artery disease    40-50% mid LAD 04/29/09, Medical tx. (Dr. Gwenlyn Found)   Diabetes mellitus    Type II- reports being off all medication d/t it being controlled   Diabetes mellitus without complication ([place])    Phreesia 11/04/2020   Essential (primary) hypertension    Fibromyalgia    Fibromyalgia    History of blood transfusion    hemorrage duinrg pregancy   Hx of echocardiogram 10/2011   EF 55-60%   Hypercholesterolemia    Hyperlipidemia, unspecified    Hypertension    Hypertensive chronic kidney disease with stage 1 through stage 4 chronic kidney disease, or unspecified chronic kidney disease    Hypertensive chronic kidney disease with stage 5  chronic kidney disease or end stage renal disease (HCC)    Lymph edema    Lymphedema, not elsewhere classified    Morbid (severe) obesity due to excess calories ([place])    Obesity hypoventilation syndrome ([place])    On home oxygen therapy    "2L; 24/7" (12/21/2018)   Peritonitis, unspecified ([place])    Pneumonia    in past   Pulmonary hypertension ([place])    Renal disorder    M/W/F [facility] Pt started dialysis in Dec.2016   S/P colonoscopy 05/26/2007   Dr. Laural Golden sigmoid diverticulosis random biopsies benign   S/P endoscopy 05/01/2009   Dr. Penelope Coop pill-induced esophageal ulcerations distal to midesophagus, 2 small ulcers in the antrum of the stomach   Shortness of breath dyspnea    with any exertion or if heart rate  is irregular while on dialysis   Sleep apnea    reports that she no longer needs CPAP due to weight loss   Sleep apnea, unspecified    Type 2 diabetes mellitus with diabetic nephropathy ([place])    Type 2 diabetes mellitus with hyperglycemia ([place])     Past Surgical History:  Procedure Laterality Date   .Hemodialysis catheter  Right 11/28/2015   A/V FISTULAGRAM N/A 07/26/2017   Procedure: A/V Fistulagram - Left Arm;  Surgeon: Serafina Mitchell, MD;  Location: [facility];  Service: Cardiovascular;  Laterality: N/A;   ABDOMINAL HYSTERECTOMY     total hysterectomy   ADENOIDECTOMY     AV FISTULA PLACEMENT Left 03/15/2016   Procedure: CREATION OF LEFT ARM ARTERIOVENOUS (AV) FISTULA  ;  Surgeon: Rosetta Posner, MD;  Location: [facility];  Service: Vascular;  Laterality: Left;   CAPD INSERTION N/A 08/30/2017   Procedure: LAPAROSCOPIC INSERTION CONTINUOUS AMBULATORY PERITONEAL DIALYSIS  (CAPD) CATHETER;  Surgeon: Clovis Riley, MD;  Location: [facility];  Service: General;  Laterality: N/A;   CESAREAN SECTION      x 2   CESAREAN SECTION N/A    Phreesia 11/04/2020   CRANIECTOMY SUBOCCIPITAL W/ CERVICAL LAMINECTOMY / CHIARI     FISTULA SUPERFICIALIZATION Left 05/10/2016   Procedure:  Left Arm FISTULA SUPERFICIALIZATION;  Surgeon: Rosetta Posner, MD;  Location: [facility];  Service: Vascular;  Laterality: Left;   IR GENERIC HISTORICAL  08/14/2016   IR REMOVAL TUN ACCESS W/ PORT W/O FL MOD SED 08/14/2016 Arne Cleveland, MD [facility]   IR SINUS/FIST TUBE CHK-NON GI  01/01/2019   IR US GUIDE BX ASP/DRAIN  12/28/2018   MINOR REMOVAL OF PERITONEAL DIALYSIS CATHETER N/A 04/26/2019   Procedure: REMOVAL OF INFECTED PERITONEAL DIALYSIS CATHETER;  Surgeon: Erroll Luna, MD;  Location: [facility];  Service: General;  Laterality: N/A;   PERIPHERAL VASCULAR BALLOON ANGIOPLASTY Left 10/18/2019   Procedure: PERIPHERAL VASCULAR BALLOON ANGIOPLASTY;  Surgeon: Marty Heck, MD;  Location: [facility];  Service: Cardiovascular;  Laterality: Left;  arm fistula   PERIPHERAL VASCULAR CATHETERIZATION Left 11/25/2016   Procedure: A/V Fistulagram;  Surgeon: Conrad La Follette, MD;  Location: [facility];  Service: Cardiovascular;  Laterality: Left;  arm   PORTACATH PLACEMENT  07/05/2012   Procedure: INSERTION PORT-A-CATH;  Surgeon: Donato Heinz, MD;  Location: [facility];  Service: General;  Laterality: Left;  subclavian   portacath removal     RIGHT HEART CATH N/A 08/04/2017   Procedure: RIGHT HEART CATH;  Surgeon: Jolaine Artist, MD;  Location: [facility];  Service: Cardiovascular;  Laterality: N/A;   THYROIDECTOMY, PARTIAL     TONSILLECTOMY      Family History  Problem Relation Age of Onset   Colon cancer Mother 56   Stroke Mother 29   Coronary artery disease Mother     Social History   Socioeconomic History   Marital status: Married    Spouse name: Not on file   Number of children: 3   Years of education: Not on file   Highest education level: Not on file  Occupational History   Not on file  Tobacco Use   Smoking status: Never   Smokeless tobacco: Never  Vaping Use   Vaping Use: Never used  Substance and Sexual Activity   Alcohol use: No    Alcohol/week: 0.0  standard drinks   Drug use: No   Sexual activity: Not Currently  Other Topics Concern   Not on file  Social History Narrative   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 15 tablet 1   albuterol (PROVENTIL) (2.5 MG/3ML) 0.083% nebulizer solution Take 2.5 mg by nebulization every 6 (six) hours as needed for wheezing or shortness of breath.     albuterol (VENTOLIN HFA) 108 (90 Base) MCG/ACT inhaler Inhale 2 puffs into the lungs 4 (four) times daily.     ALPRAZolam (XANAX) 0.5 MG tablet Take 0.5 mg by mouth 2 (two) times daily as needed for anxiety.     amLODipine (NORVASC) 2.5 MG tablet Take 1 tablet (2.5 mg total) by mouth daily. Hold it on your hemodialysis- 30 tablet 2   diphenhydrAMINE (BENADRYL) 25 mg capsule Take 25 mg by mouth daily as needed for allergies.      guaiFENesin-dextromethorphan (ROBITUSSIN DM) 100-10 MG/5ML syrup Take 15 mLs by mouth every 6 (six) hours as needed for cough. 118 mL 1   insulin detemir (LEVEMIR) 100 UNIT/ML injection Inject 0.03 mLs (3 Units total) into the skin daily. 10 mL 11   lactulose (CHRONULAC) 10 GM/15ML solution Take 45 mLs (30 g total) by mouth 2 (two) times daily. For constipation 236 mL 0   lanthanum (FOSRENOL) 500 MG chewable tablet Chew 1,000-1,500 mg by mouth 5 (five) times daily. 1500 mg by mouth three times a day with meals and 1000 mg by mouth twice a day with snacks     loperamide (IMODIUM) 2 MG capsule Take 2-4 mg by mouth as needed for diarrhea or loose stools.       Melatonin 5 MG CAPS Take 5 mg by mouth at bedtime as needed (sleep).     midodrine (PROAMATINE) 10 MG tablet Take 1 tablet (10 mg total) by mouth 2 (two) times daily with a meal. --Always take it (do Not hold it)  before hemodialysis to avoid low blood pressure 60 tablet 3   Molnupiravir 200 MG CAPS Take 4 capsules (800 mg total) by mouth in the morning and at bedtime. 40 capsule 0   multivitamin (RENA-VIT) TABS tablet Take 1 tablet by mouth daily.  11   nystatin (MYCOSTATIN/NYSTOP) powder Apply 1 application topically 2 (two) times daily.     Oxycodone HCl 10 MG TABS Take 1 tablet (10 mg total) by mouth 4 (four) times daily as needed (pain).     sertraline (ZOLOFT) 50 MG tablet Take 50 mg by mouth daily.     No facility-administered medications prior to visit.    Allergies  Allergen Reactions   Contrast Media [Iodinated Diagnostic Agents] Anaphylaxis, Hives, Swelling and Other (See Comments)    Dye for cardiac cath. Tongue swells   Pneumococcal Vaccines Swelling and Other (See Comments)    Turns skin black, and bodily swelling   Vancomycin Nausea And Vomiting and Other (See Comments)    Infusion "made me feel like I was dying" had to be readmitted to hospital    Review of Systems  Constitutional: Negative.   Skin:        Redness and swelling to her right upper leg      Objective:    Physical Exam  There were no vitals taken for this visit. Wt Readings from Last 3 Encounters:  11/04/20 297 lb (134.7 kg)  07/04/20 296 lb 4.8 oz (134.4 kg)  10/18/19 295 lb 6.7 oz (134  kg)    Health Maintenance Due  Topic Date Due   COVID-19 Vaccine (1) Never done   FOOT EXAM  Never done   OPHTHALMOLOGY EXAM  Never done   PAP SMEAR-Modifier  Never done   COLONOSCOPY (Pts 45-41yr Insurance coverage will need to be confirmed)  Never done   URINE MICROALBUMIN  08/30/2015   MAMMOGRAM  Never done   HEMOGLOBIN A1C  12/31/2020    There are no preventive care reminders to display for this  patient.   Lab Results  Component Value Date   TSH 1.402 06/06/2016   Lab Results  Component Value Date   WBC 8.5 07/04/2020   HGB 11.1 (L) 07/04/2020   HCT 35.5 (L) 07/04/2020   MCV 101.4 (H) 07/04/2020   PLT 195 07/04/2020   Lab Results  Component Value Date   NA 136 07/04/2020   NA 135 07/04/2020   K 4.9 07/04/2020   K 4.9 07/04/2020   CO2 23 07/04/2020   CO2 25 07/04/2020   GLUCOSE 128 (H) 07/04/2020   GLUCOSE 128 (H) 07/04/2020   BUN 51 (H) 07/04/2020   BUN 52 (H) 07/04/2020   CREATININE 9.27 (H) 07/04/2020   CREATININE 9.25 (H) 07/04/2020   BILITOT 0.7 07/04/2020   ALKPHOS 191 (H) 07/04/2020   AST 17 07/04/2020   ALT 11 07/04/2020   PROT 6.9 07/04/2020   ALBUMIN 3.2 (L) 07/04/2020   ALBUMIN 3.2 (L) 07/04/2020   CALCIUM 8.8 (L) 07/04/2020   CALCIUM 8.7 (L) 07/04/2020   ANIONGAP 18 (H) 07/04/2020   ANIONGAP 15 07/04/2020   Lab Results  Component Value Date   CHOL  04/29/2009    167        ATP III CLASSIFICATION:  <200     mg/dL   Desirable  200-239  mg/dL   Borderline High  >=240    mg/dL   High          Lab Results  Component Value Date   HDL 37 (L) 04/29/2009   Lab Results  Component Value Date   LDLCALC  04/29/2009    86        Total Cholesterol/HDL:CHD Risk Coronary Heart Disease Risk Table                     Men   Women  1/2 Average Risk   3.4   3.3  Average Risk       5.0   4.4  2 X Average Risk   9.6   7.1  3 X Average Risk  23.4   11.0        Use the calculated Patient Ratio above and the CHD Risk Table to determine the patient's CHD Risk.        ATP III CLASSIFICATION (LDL):  <100     mg/dL   Optimal  100-129  mg/dL   Near or Above                    Optimal  130-159  mg/dL   Borderline  160-189  mg/dL   High  >190     mg/dL   Very High   Lab Results  Component Value Date   TRIG 136 06/30/2020   Lab Results  Component Value Date   CHOLHDL 4.5 04/29/2009   Lab Results  Component Value Date   HGBA1C 7.2 (H)  06/30/2020       Assessment & Plan:   Problem List Items  Addressed This Visit       Other   Cellulitis    -to right upper leg; given location, less concerned for DVT, but if no improvement, will consider U/S -Rx. Keflex -if no improvement in a week or symptoms get worse, she will need a physical exam in the clinic       Relevant Medications   cephALEXin (KEFLEX) 500 MG capsule     Meds ordered this encounter  Medications   cephALEXin (KEFLEX) 500 MG capsule    Sig: Take 1 capsule (500 mg total) by mouth 2 (two) times daily. Try to take this every 12 hours. On dialysis days, take the first dose after dialysis and the second at bedtime.    Dispense:  20 capsule    Refill:  0    Date:  06/09/2021   Location of Patient: Home Location of Provider: Office Consent was obtain for visit to be over via telehealth. I verified that I am speaking with the correct person using two identifiers.  I connected with  Earlean Shawl on 06/09/21 via telephone and verified that I am speaking with the correct person using two identifiers.   I discussed the limitations of evaluation and management by telemedicine. The patient expressed understanding and agreed to proceed.  Time spent: 7 minutes   Noreene Larsson, NP

## 2021-06-11 ENCOUNTER — Ambulatory Visit: Payer: 59 | Admitting: Nurse Practitioner

## 2021-06-19 ENCOUNTER — Inpatient Hospital Stay (HOSPITAL_COMMUNITY)
Admission: EM | Admit: 2021-06-19 | Discharge: 2021-06-21 | DRG: 570 | Disposition: A | Discharge status: Home Health | Payer: 59 | Source: Other Acute Inpatient Hospital | Attending: Internal Medicine | Admitting: Internal Medicine

## 2021-06-19 ENCOUNTER — Emergency Department (HOSPITAL_COMMUNITY): Payer: 59

## 2021-06-19 ENCOUNTER — Other Ambulatory Visit: Payer: Self-pay

## 2021-06-19 ENCOUNTER — Encounter (HOSPITAL_COMMUNITY): Payer: Self-pay

## 2021-06-19 DIAGNOSIS — Z85850 Personal history of malignant neoplasm of thyroid: Secondary | ICD-10-CM

## 2021-06-19 DIAGNOSIS — E871 Hypo-osmolality and hyponatremia: Secondary | ICD-10-CM | POA: Diagnosis not present

## 2021-06-19 DIAGNOSIS — L03116 Cellulitis of left lower limb: Secondary | ICD-10-CM | POA: Diagnosis present

## 2021-06-19 DIAGNOSIS — D631 Anemia in chronic kidney disease: Secondary | ICD-10-CM | POA: Diagnosis present

## 2021-06-19 DIAGNOSIS — E0800 Diabetes mellitus due to underlying condition with hyperosmolarity without nonketotic hyperglycemic-hyperosmolar coma (NKHHC): Secondary | ICD-10-CM

## 2021-06-19 DIAGNOSIS — E785 Hyperlipidemia, unspecified: Secondary | ICD-10-CM | POA: Diagnosis present

## 2021-06-19 DIAGNOSIS — I484 Atypical atrial flutter: Secondary | ICD-10-CM | POA: Diagnosis present

## 2021-06-19 DIAGNOSIS — I10 Essential (primary) hypertension: Secondary | ICD-10-CM | POA: Diagnosis present

## 2021-06-19 DIAGNOSIS — E662 Morbid (severe) obesity with alveolar hypoventilation: Secondary | ICD-10-CM | POA: Diagnosis present

## 2021-06-19 DIAGNOSIS — K761 Chronic passive congestion of liver: Secondary | ICD-10-CM | POA: Diagnosis present

## 2021-06-19 DIAGNOSIS — Z823 Family history of stroke: Secondary | ICD-10-CM

## 2021-06-19 DIAGNOSIS — I248 Other forms of acute ischemic heart disease: Secondary | ICD-10-CM | POA: Diagnosis not present

## 2021-06-19 DIAGNOSIS — I5032 Chronic diastolic (congestive) heart failure: Secondary | ICD-10-CM | POA: Diagnosis present

## 2021-06-19 DIAGNOSIS — R570 Cardiogenic shock: Secondary | ICD-10-CM | POA: Diagnosis not present

## 2021-06-19 DIAGNOSIS — I890 Lymphedema, not elsewhere classified: Secondary | ICD-10-CM

## 2021-06-19 DIAGNOSIS — R269 Unspecified abnormalities of gait and mobility: Secondary | ICD-10-CM | POA: Diagnosis present

## 2021-06-19 DIAGNOSIS — I953 Hypotension of hemodialysis: Secondary | ICD-10-CM | POA: Diagnosis present

## 2021-06-19 DIAGNOSIS — N186 End stage renal disease: Secondary | ICD-10-CM | POA: Diagnosis present

## 2021-06-19 DIAGNOSIS — L03115 Cellulitis of right lower limb: Principal | ICD-10-CM | POA: Diagnosis present

## 2021-06-19 DIAGNOSIS — M797 Fibromyalgia: Secondary | ICD-10-CM | POA: Diagnosis present

## 2021-06-19 DIAGNOSIS — Z794 Long term (current) use of insulin: Secondary | ICD-10-CM

## 2021-06-19 DIAGNOSIS — R6521 Severe sepsis with septic shock: Secondary | ICD-10-CM | POA: Diagnosis not present

## 2021-06-19 DIAGNOSIS — E872 Acidosis: Secondary | ICD-10-CM | POA: Diagnosis not present

## 2021-06-19 DIAGNOSIS — T827XXA Infection and inflammatory reaction due to other cardiac and vascular devices, implants and grafts, initial encounter: Secondary | ICD-10-CM | POA: Diagnosis not present

## 2021-06-19 DIAGNOSIS — K219 Gastro-esophageal reflux disease without esophagitis: Secondary | ICD-10-CM | POA: Diagnosis present

## 2021-06-19 DIAGNOSIS — E1122 Type 2 diabetes mellitus with diabetic chronic kidney disease: Secondary | ICD-10-CM | POA: Diagnosis present

## 2021-06-19 DIAGNOSIS — R1314 Dysphagia, pharyngoesophageal phase: Secondary | ICD-10-CM | POA: Diagnosis present

## 2021-06-19 DIAGNOSIS — E1165 Type 2 diabetes mellitus with hyperglycemia: Secondary | ICD-10-CM | POA: Diagnosis present

## 2021-06-19 DIAGNOSIS — Z20822 Contact with and (suspected) exposure to covid-19: Secondary | ICD-10-CM | POA: Diagnosis present

## 2021-06-19 DIAGNOSIS — I471 Supraventricular tachycardia: Secondary | ICD-10-CM | POA: Diagnosis present

## 2021-06-19 DIAGNOSIS — Z6841 Body Mass Index (BMI) 40.0 and over, adult: Secondary | ICD-10-CM

## 2021-06-19 DIAGNOSIS — E66813 Obesity, class 3: Secondary | ICD-10-CM | POA: Diagnosis present

## 2021-06-19 DIAGNOSIS — Z887 Allergy status to serum and vaccine status: Secondary | ICD-10-CM

## 2021-06-19 DIAGNOSIS — E876 Hypokalemia: Secondary | ICD-10-CM | POA: Diagnosis present

## 2021-06-19 DIAGNOSIS — L0390 Cellulitis, unspecified: Secondary | ICD-10-CM | POA: Diagnosis not present

## 2021-06-19 DIAGNOSIS — E875 Hyperkalemia: Secondary | ICD-10-CM | POA: Diagnosis not present

## 2021-06-19 DIAGNOSIS — Z992 Dependence on renal dialysis: Secondary | ICD-10-CM

## 2021-06-19 DIAGNOSIS — I2721 Secondary pulmonary arterial hypertension: Secondary | ICD-10-CM | POA: Diagnosis present

## 2021-06-19 DIAGNOSIS — A411 Sepsis due to other specified staphylococcus: Secondary | ICD-10-CM | POA: Diagnosis not present

## 2021-06-19 DIAGNOSIS — I878 Other specified disorders of veins: Secondary | ICD-10-CM | POA: Diagnosis present

## 2021-06-19 DIAGNOSIS — J449 Chronic obstructive pulmonary disease, unspecified: Secondary | ICD-10-CM | POA: Diagnosis present

## 2021-06-19 DIAGNOSIS — Z91041 Radiographic dye allergy status: Secondary | ICD-10-CM

## 2021-06-19 DIAGNOSIS — M898X9 Other specified disorders of bone, unspecified site: Secondary | ICD-10-CM | POA: Diagnosis present

## 2021-06-19 DIAGNOSIS — I132 Hypertensive heart and chronic kidney disease with heart failure and with stage 5 chronic kidney disease, or end stage renal disease: Secondary | ICD-10-CM | POA: Diagnosis present

## 2021-06-19 DIAGNOSIS — N2581 Secondary hyperparathyroidism of renal origin: Secondary | ICD-10-CM | POA: Diagnosis present

## 2021-06-19 DIAGNOSIS — Z993 Dependence on wheelchair: Secondary | ICD-10-CM

## 2021-06-19 DIAGNOSIS — L03119 Cellulitis of unspecified part of limb: Secondary | ICD-10-CM | POA: Diagnosis present

## 2021-06-19 DIAGNOSIS — Z8249 Family history of ischemic heart disease and other diseases of the circulatory system: Secondary | ICD-10-CM

## 2021-06-19 DIAGNOSIS — E119 Type 2 diabetes mellitus without complications: Secondary | ICD-10-CM

## 2021-06-19 DIAGNOSIS — I2510 Atherosclerotic heart disease of native coronary artery without angina pectoris: Secondary | ICD-10-CM | POA: Diagnosis present

## 2021-06-19 DIAGNOSIS — Z800 Family history of malignant neoplasm of digestive organs: Secondary | ICD-10-CM

## 2021-06-19 DIAGNOSIS — Z79899 Other long term (current) drug therapy: Secondary | ICD-10-CM

## 2021-06-19 DIAGNOSIS — L89212 Pressure ulcer of right hip, stage 2: Secondary | ICD-10-CM | POA: Diagnosis not present

## 2021-06-19 DIAGNOSIS — Y848 Other medical procedures as the cause of abnormal reaction of the patient, or of later complication, without mention of misadventure at the time of the procedure: Secondary | ICD-10-CM | POA: Diagnosis not present

## 2021-06-19 DIAGNOSIS — J9601 Acute respiratory failure with hypoxia: Secondary | ICD-10-CM | POA: Diagnosis not present

## 2021-06-19 DIAGNOSIS — Z881 Allergy status to other antibiotic agents status: Secondary | ICD-10-CM

## 2021-06-19 DIAGNOSIS — M79606 Pain in leg, unspecified: Secondary | ICD-10-CM

## 2021-06-19 DIAGNOSIS — I5082 Biventricular heart failure: Secondary | ICD-10-CM | POA: Diagnosis present

## 2021-06-19 DIAGNOSIS — I5033 Acute on chronic diastolic (congestive) heart failure: Secondary | ICD-10-CM | POA: Diagnosis not present

## 2021-06-19 LAB — CBC WITH DIFFERENTIAL/PLATELET
Abs Immature Granulocytes: 0.04 10*3/uL (ref 0.00–0.07)
Basophils Absolute: 0 10*3/uL (ref 0.0–0.1)
Basophils Relative: 1 %
Eosinophils Absolute: 0.2 10*3/uL (ref 0.0–0.5)
Eosinophils Relative: 3 %
HCT: 36.7 % (ref 36.0–46.0)
Hemoglobin: 11.4 g/dL — ABNORMAL LOW (ref 12.0–15.0)
Immature Granulocytes: 1 %
Lymphocytes Relative: 19 %
Lymphs Abs: 1.3 10*3/uL (ref 0.7–4.0)
MCH: 30.6 pg (ref 26.0–34.0)
MCHC: 31.1 g/dL (ref 30.0–36.0)
MCV: 98.4 fL (ref 80.0–100.0)
Monocytes Absolute: 0.5 10*3/uL (ref 0.1–1.0)
Monocytes Relative: 7 %
Neutro Abs: 4.9 10*3/uL (ref 1.7–7.7)
Neutrophils Relative %: 69 %
Platelets: 201 10*3/uL (ref 150–400)
RBC: 3.73 MIL/uL — ABNORMAL LOW (ref 3.87–5.11)
RDW: 14.4 % (ref 11.5–15.5)
WBC: 7 10*3/uL (ref 4.0–10.5)
nRBC: 0 % (ref 0.0–0.2)

## 2021-06-19 LAB — BASIC METABOLIC PANEL
Anion gap: 11 (ref 5–15)
BUN: 19 mg/dL (ref 6–20)
CO2: 29 mmol/L (ref 22–32)
Calcium: 8.3 mg/dL — ABNORMAL LOW (ref 8.9–10.3)
Chloride: 95 mmol/L — ABNORMAL LOW (ref 98–111)
Creatinine, Ser: 4.53 mg/dL — ABNORMAL HIGH (ref 0.44–1.00)
GFR, Estimated: 11 mL/min — ABNORMAL LOW (ref >60)
Glucose, Bld: 96 mg/dL (ref 70–99)
Potassium: 2.5 mmol/L — CL (ref 3.5–5.1)
Sodium: 135 mmol/L (ref 135–145)

## 2021-06-19 LAB — RESP PANEL BY RT-PCR (FLU A&B, COVID) ARPGX2
Influenza A by PCR: NEGATIVE
Influenza B by PCR: NEGATIVE
SARS Coronavirus 2 by RT PCR: NEGATIVE

## 2021-06-19 LAB — HEPATIC FUNCTION PANEL
ALT: 14 U/L (ref 0–44)
AST: 15 U/L (ref 15–41)
Albumin: 3.3 g/dL — ABNORMAL LOW (ref 3.5–5.0)
Alkaline Phosphatase: 327 U/L — ABNORMAL HIGH (ref 38–126)
Bilirubin, Direct: 0.1 mg/dL (ref 0.0–0.2)
Indirect Bilirubin: 0.4 mg/dL (ref 0.3–0.9)
Total Bilirubin: 0.5 mg/dL (ref 0.3–1.2)
Total Protein: 6.6 g/dL (ref 6.5–8.1)

## 2021-06-19 LAB — PROTIME-INR
INR: 1.1 (ref 0.8–1.2)
Prothrombin Time: 14.6 seconds (ref 11.4–15.2)

## 2021-06-19 LAB — LACTIC ACID, PLASMA: Lactic Acid, Venous: 1.4 mmol/L (ref 0.5–1.9)

## 2021-06-19 LAB — MRSA NEXT GEN BY PCR, NASAL: MRSA by PCR Next Gen: DETECTED — AB

## 2021-06-19 LAB — APTT: aPTT: 31 seconds (ref 24–36)

## 2021-06-19 LAB — GLUCOSE, CAPILLARY: Glucose-Capillary: 92 mg/dL (ref 70–99)

## 2021-06-19 MED ORDER — CHLORHEXIDINE GLUCONATE CLOTH 2 % EX PADS
6.0000 | MEDICATED_PAD | Freq: Every day | CUTANEOUS | Status: DC
  Administered 2021-06-20 – 2021-06-21 (×2): 6 via TOPICAL

## 2021-06-19 MED ORDER — LANTHANUM CARBONATE 500 MG PO CHEW
1500.0000 mg | CHEWABLE_TABLET | Freq: Three times a day (TID) | ORAL | Status: DC
  Administered 2021-06-20 – 2021-06-21 (×3): 1500 mg via ORAL
  Filled 2021-06-19 (×9): qty 3

## 2021-06-19 MED ORDER — OXYCODONE HCL 5 MG PO TABS
10.0000 mg | ORAL_TABLET | Freq: Four times a day (QID) | ORAL | Status: DC | PRN
  Administered 2021-06-20 (×2): 10 mg via ORAL
  Filled 2021-06-19 (×2): qty 2

## 2021-06-19 MED ORDER — INSULIN ASPART 100 UNIT/ML IJ SOLN: 0.0000 [IU] | Freq: Three times a day (TID) | INTRAMUSCULAR | Status: DC

## 2021-06-19 MED ORDER — MORPHINE SULFATE (PF) 4 MG/ML IV SOLN
4.0000 mg | Freq: Once | INTRAVENOUS | Status: AC
  Administered 2021-06-19: 4 mg via INTRAVENOUS
  Filled 2021-06-19: qty 1

## 2021-06-19 MED ORDER — CALCIUM ACETATE (PHOS BINDER) 667 MG PO CAPS
1334.0000 mg | ORAL_CAPSULE | Freq: Three times a day (TID) | ORAL | Status: DC
  Administered 2021-06-20 – 2021-06-21 (×4): 1334 mg via ORAL
  Filled 2021-06-19 (×5): qty 2

## 2021-06-19 MED ORDER — CEFAZOLIN SODIUM-DEXTROSE 2-4 GM/100ML-% IV SOLN
2.0000 g | Freq: Two times a day (BID) | INTRAVENOUS | Status: DC
  Administered 2021-06-20 – 2021-06-21 (×3): 2 g via INTRAVENOUS
  Filled 2021-06-19 (×3): qty 100

## 2021-06-19 MED ORDER — POTASSIUM CHLORIDE 10 MEQ/100ML IV SOLN
10.0000 meq | INTRAVENOUS | Status: AC
  Administered 2021-06-19 (×2): 10 meq via INTRAVENOUS
  Filled 2021-06-19 (×2): qty 100

## 2021-06-19 MED ORDER — ACETAMINOPHEN 325 MG PO TABS: 650.0000 mg | ORAL_TABLET | ORAL | Status: DC | PRN

## 2021-06-19 MED ORDER — HEPARIN SODIUM (PORCINE) 5000 UNIT/ML IJ SOLN
5000.0000 [IU] | Freq: Three times a day (TID) | INTRAMUSCULAR | Status: DC
  Administered 2021-06-19 – 2021-06-21 (×5): 5000 [IU] via SUBCUTANEOUS
  Filled 2021-06-19 (×5): qty 1

## 2021-06-19 MED ORDER — CEFAZOLIN SODIUM-DEXTROSE 2-4 GM/100ML-% IV SOLN
2.0000 g | Freq: Once | INTRAVENOUS | Status: AC
  Administered 2021-06-19: 2 g via INTRAVENOUS
  Filled 2021-06-19: qty 100

## 2021-06-19 MED ORDER — LOPERAMIDE HCL 2 MG PO CAPS: 2.0000 mg | ORAL_CAPSULE | ORAL | Status: DC | PRN

## 2021-06-19 MED ORDER — LANTHANUM CARBONATE 500 MG PO CHEW
1000.0000 mg | CHEWABLE_TABLET | Freq: Two times a day (BID) | ORAL | Status: DC | PRN
  Filled 2021-06-19: qty 2

## 2021-06-19 MED ORDER — IBUPROFEN 400 MG PO TABS: 400.0000 mg | ORAL_TABLET | Freq: Four times a day (QID) | ORAL | Status: DC | PRN

## 2021-06-19 MED ORDER — LACTULOSE 10 GM/15ML PO SOLN
30.0000 g | Freq: Two times a day (BID) | ORAL | Status: DC
  Administered 2021-06-19 – 2021-06-21 (×4): 30 g via ORAL
  Filled 2021-06-19 (×4): qty 60

## 2021-06-19 MED ORDER — MUPIROCIN 2 % EX OINT
1.0000 "application " | TOPICAL_OINTMENT | Freq: Two times a day (BID) | CUTANEOUS | Status: DC
  Administered 2021-06-19 – 2021-06-21 (×4): 1 via NASAL
  Filled 2021-06-19: qty 22

## 2021-06-19 MED ORDER — RENA-VITE PO TABS
1.0000 | ORAL_TABLET | Freq: Every day | ORAL | Status: DC
  Administered 2021-06-19 – 2021-06-21 (×3): 1 via ORAL
  Filled 2021-06-19 (×3): qty 1

## 2021-06-19 MED ORDER — INSULIN ASPART 100 UNIT/ML IJ SOLN: 0.0000 [IU] | Freq: Every day | INTRAMUSCULAR | Status: DC

## 2021-06-19 MED ORDER — CALCIUM CARBONATE ANTACID 500 MG PO CHEW
2.0000 | CHEWABLE_TABLET | Freq: Two times a day (BID) | ORAL | Status: DC
  Administered 2021-06-19 – 2021-06-21 (×4): 400 mg via ORAL
  Filled 2021-06-19 (×4): qty 2

## 2021-06-19 MED ORDER — ONDANSETRON HCL 4 MG/2ML IJ SOLN
4.0000 mg | Freq: Four times a day (QID) | INTRAMUSCULAR | Status: DC | PRN
  Administered 2021-06-19 – 2021-06-20 (×3): 4 mg via INTRAVENOUS
  Filled 2021-06-19 (×3): qty 2

## 2021-06-19 MED ORDER — CLINDAMYCIN PHOSPHATE 600 MG/50ML IV SOLN
600.0000 mg | Freq: Three times a day (TID) | INTRAVENOUS | Status: DC
  Administered 2021-06-19 – 2021-06-21 (×5): 600 mg via INTRAVENOUS
  Filled 2021-06-19 (×5): qty 50

## 2021-06-19 MED ORDER — ONDANSETRON HCL 4 MG PO TABS: 4.0000 mg | ORAL_TABLET | Freq: Four times a day (QID) | ORAL | Status: DC | PRN

## 2021-06-19 NOTE — H&P (Signed)
History and Physical  [name] BVQ:[id number] DOB: Jan 07, 1966 DOA: 06/19/2021  Referring physician: Silverio Decamp, PA-C, ED provider PCP: Noreene Larsson, NP  Outpatient Specialists:   Patient Coming From: home  Chief Complaint: right hip and leg pain  HPI: [name] is a 55 y.o. female with a history of diet-controlled type 2 diabetes, hypertension, end-stage renal disease on dialysis, CHF, COPD, chronic lymphedema of her left leg.  Patient seen for increasing pain, swelling of her right medial thigh and right buttock.  She was diagnosed with cellulitis of her right thigh approximately 2 weeks ago.  She was prescribed Keflex, which she took pretty faithfully.  The erythema and swelling have decreased in that area, but the pain and swelling has spread to the right posterior thigh with extension up to the buttock and her right medial thigh.  The skin is thickened and swollen and the area is a lot more painful.  At dialysis, her nephrologist passed by and saw the area and instructed her to come to the emergency department for evaluation.  She denies fevers, chills, nausea, vomiting, decreased appetite, chest pain, shortness of breath.  Emergency Department Course: White count normal.  CT scan shows thickening of the skin in the buttock and right medial thigh.  This is consistent with cellulitis 2 g of Ancef given  Review of Systems:   Pt denies any fevers, chills, nausea, vomiting, diarrhea, constipation, abdominal pain, shortness of breath, dyspnea on exertion, orthopnea, cough, wheezing, palpitations, headache, vision changes, lightheadedness, dizziness, melena, rectal bleeding.  Review of systems are otherwise negative  Past Medical History:  Diagnosis Date   Allergy    Phreesia 11/04/2020   Anemia, unspecified    Anxiety    Arthritis    Phreesia 11/04/2020   Asthma    as a child   Cancer ([place])    thyroid   Cellulitis    CHF (congestive heart failure) ([place])     Chiari malformation    s/p surgery   Chiari malformation    Chronic kidney disease    Phreesia 11/04/2020   Chronic kidney disease, stage 5 (HCC)    Chronic obstructive pulmonary disease, unspecified ([place])    Chronic pain    Complication of anesthesia 11/28/15   Resp arrest after  conscious  sedation   Compression of brain (HCC)    Constipation    COPD (chronic obstructive pulmonary disease) ([place])    Phreesia 11/04/2020   Coronary artery disease    40-50% mid LAD 04/29/09, Medical tx. (Dr. Gwenlyn Found)   Diabetes mellitus    Type II- reports being off all medication d/t it being controlled   Diabetes mellitus without complication ([place])    Phreesia 11/04/2020   Essential (primary) hypertension    Fibromyalgia    Fibromyalgia    History of blood transfusion    hemorrage duinrg pregancy   Hx of echocardiogram 10/2011   EF 55-60%   Hypercholesterolemia    Hyperlipidemia, unspecified    Hypertension    Hypertensive chronic kidney disease with stage 1 through stage 4 chronic kidney disease, or unspecified chronic kidney disease    Hypertensive chronic kidney disease with stage 5 chronic kidney disease or end stage renal disease (HCC)    Lymph edema    Lymphedema, not elsewhere classified    Morbid (severe) obesity due to excess calories ([place])    Obesity hypoventilation syndrome ([place])    On home oxygen therapy    "2L; 24/7" (12/21/2018)   Peritonitis,  unspecified ([place])    Pneumonia    in past   Pulmonary hypertension ([place])    Renal disorder    M/W/F [facility] Pt started dialysis in Dec.2016   S/P colonoscopy 05/26/2007   Dr. Laural Golden sigmoid diverticulosis random biopsies benign   S/P endoscopy 05/01/2009   Dr. Penelope Coop pill-induced esophageal ulcerations distal to midesophagus, 2 small ulcers in the antrum of the stomach   Shortness of breath dyspnea    with any exertion or if heart rate  is irregular while on dialysis   Sleep apnea    reports that she no longer needs CPAP due  to weight loss   Sleep apnea, unspecified    Type 2 diabetes mellitus with diabetic nephropathy ([place])    Type 2 diabetes mellitus with hyperglycemia ([place])    Past Surgical History:  Procedure Laterality Date   .Hemodialysis catheter Right 11/28/2015   A/V FISTULAGRAM N/A 07/26/2017   Procedure: A/V Fistulagram - Left Arm;  Surgeon: Serafina Mitchell, MD;  Location: [facility];  Service: Cardiovascular;  Laterality: N/A;   ABDOMINAL HYSTERECTOMY     total hysterectomy   ADENOIDECTOMY     AV FISTULA PLACEMENT Left 03/15/2016   Procedure: CREATION OF LEFT ARM ARTERIOVENOUS (AV) FISTULA  ;  Surgeon: Rosetta Posner, MD;  Location: [facility];  Service: Vascular;  Laterality: Left;   CAPD INSERTION N/A 08/30/2017   Procedure: LAPAROSCOPIC INSERTION CONTINUOUS AMBULATORY PERITONEAL DIALYSIS  (CAPD) CATHETER;  Surgeon: Clovis Riley, MD;  Location: [facility];  Service: General;  Laterality: N/A;   CESAREAN SECTION      x 2   CESAREAN SECTION N/A    Phreesia 11/04/2020   CRANIECTOMY SUBOCCIPITAL W/ CERVICAL LAMINECTOMY / CHIARI     FISTULA SUPERFICIALIZATION Left 05/10/2016   Procedure: Left Arm FISTULA SUPERFICIALIZATION;  Surgeon: Rosetta Posner, MD;  Location: [facility];  Service: Vascular;  Laterality: Left;   IR GENERIC HISTORICAL  08/14/2016   IR REMOVAL TUN ACCESS W/ PORT W/O FL MOD SED 08/14/2016 Arne Cleveland, MD [facility]   IR SINUS/FIST TUBE CHK-NON GI  01/01/2019   IR US GUIDE BX ASP/DRAIN  12/28/2018   MINOR REMOVAL OF PERITONEAL DIALYSIS CATHETER N/A 04/26/2019   Procedure: REMOVAL OF INFECTED PERITONEAL DIALYSIS CATHETER;  Surgeon: Erroll Luna, MD;  Location: [facility];  Service: General;  Laterality: N/A;   PERIPHERAL VASCULAR BALLOON ANGIOPLASTY Left 10/18/2019   Procedure: PERIPHERAL VASCULAR BALLOON ANGIOPLASTY;  Surgeon: Marty Heck, MD;  Location: [facility];  Service: Cardiovascular;  Laterality: Left;  arm fistula   PERIPHERAL VASCULAR CATHETERIZATION Left 11/25/2016    Procedure: A/V Fistulagram;  Surgeon: Conrad Cokato, MD;  Location: [facility];  Service: Cardiovascular;  Laterality: Left;  arm   PORTACATH PLACEMENT  07/05/2012   Procedure: INSERTION PORT-A-CATH;  Surgeon: Donato Heinz, MD;  Location: [facility];  Service: General;  Laterality: Left;  subclavian   portacath removal     RIGHT HEART CATH N/A 08/04/2017   Procedure: RIGHT HEART CATH;  Surgeon: Jolaine Artist, MD;  Location: [facility];  Service: Cardiovascular;  Laterality: N/A;   THYROIDECTOMY, PARTIAL     TONSILLECTOMY     Social History:  reports that she has never smoked. She has never used smokeless tobacco. She reports that she does not drink alcohol and does not use drugs. Patient lives at home  Allergies  Allergen Reactions   Contrast Media [Iodinated Diagnostic Agents] Anaphylaxis,  Hives, Swelling and Other (See Comments)    Dye for cardiac cath. Tongue swells   Pneumococcal Vaccines Swelling and Other (See Comments)    Turns skin black, and bodily swelling   Vancomycin Nausea And Vomiting and Other (See Comments)    Infusion "made me feel like I was dying" had to be readmitted to hospital    Family History  Problem Relation Age of Onset   Colon cancer Mother 64   Stroke Mother 96   Coronary artery disease Mother       Prior to Admission medications   Medication Sig Start Date End Date Taking? Authorizing Provider  acetaminophen (TYLENOL) 325 MG tablet Take 2 tablets (650 mg total) by mouth every 4 (four) hours as needed for mild pain (or Fever >/= 101). 07/04/20  Yes Emokpae, Courage, MD  calcium carbonate (TUMS - DOSED IN MG ELEMENTAL CALCIUM) 500 MG chewable tablet Chew 2 tablets by mouth 2 (two) times daily.   Yes [provider]  cephALEXin (KEFLEX) 500 MG capsule Take 1 capsule (500 mg total) by mouth 2 (two) times daily. Try to take this every 12 hours. On dialysis days, take the first dose after dialysis and the second at bedtime. 06/09/21   Yes Noreene Larsson, NP  diphenhydrAMINE (BENADRYL) 25 mg capsule Take 25 mg by mouth daily as needed for allergies.    Yes [provider]  Doxercalciferol (HECTOROL PO) Take 60 mg by mouth See admin instructions. Take on Mon,Wed, and Friday at dialysis   Yes [provider]  ibuprofen (ADVIL) 200 MG tablet Take 400 mg by mouth every 6 (six) hours as needed for moderate pain.   Yes [provider]  lactulose (CHRONULAC) 10 GM/15ML solution Take 45 mLs (30 g total) by mouth 2 (two) times daily. For constipation 04/30/19  Yes Rai, Ripudeep K, MD  lanthanum (FOSRENOL) 500 MG chewable tablet Chew 1,000-1,500 mg by mouth 5 (five) times daily. 1500 mg by mouth three times a day with meals and 1000 mg by mouth twice a day with snacks   Yes [provider]  loperamide (IMODIUM) 2 MG capsule Take 2-4 mg by mouth as needed for diarrhea or loose stools.    Yes [provider]  multivitamin (RENA-VIT) TABS tablet Take 1 tablet by mouth daily. 05/30/18  Yes [provider]  Sennosides (EX-LAX PO) Take 2 tablets by mouth daily as needed.   Yes [provider]  albuterol (PROVENTIL) (2.5 MG/3ML) 0.083% nebulizer solution Take 2.5 mg by nebulization every 6 (six) hours as needed for wheezing or shortness of breath. Patient not taking: Reported on 06/19/2021    [provider]  albuterol (VENTOLIN HFA) 108 (90 Base) MCG/ACT inhaler Inhale 2 puffs into the lungs 4 (four) times daily. Patient not taking: Reported on 06/19/2021    [provider]  ALPRAZolam Duanne Moron) 0.5 MG tablet Take 0.5 mg by mouth 2 (two) times daily as needed for anxiety. Patient not taking: Reported on 06/19/2021    [provider]  amLODipine (NORVASC) 2.5 MG tablet Take 1 tablet (2.5 mg total) by mouth daily. Hold it on your hemodialysis- Patient not taking: No sig reported 07/04/20   Roxan Hockey, MD  calcium acetate (PHOSLO) 667 MG capsule Take 1,334 mg by  mouth 3 (three) times daily. 05/27/21   [provider]  guaiFENesin-dextromethorphan (ROBITUSSIN DM) 100-10 MG/5ML syrup Take 15 mLs by mouth every 6 (six) hours as needed for cough. Patient not taking: No sig  reported 02/03/21   Noreene Larsson, NP  insulin detemir (LEVEMIR) 100 UNIT/ML injection Inject 0.03 mLs (3 Units total) into the skin daily. Patient not taking: Reported on 06/19/2021 04/30/19   Rai, Vernelle Emerald, MD  Melatonin 5 MG CAPS Take 5 mg by mouth at bedtime as needed (sleep). Patient not taking: Reported on 06/19/2021    [provider]  midodrine (PROAMATINE) 10 MG tablet Take 1 tablet (10 mg total) by mouth 2 (two) times daily with a meal. --Always take it (do Not hold it)  before hemodialysis to avoid low blood pressure Patient not taking: No sig reported 07/04/20   Roxan Hockey, MD  Molnupiravir 200 MG CAPS Take 4 capsules (800 mg total) by mouth in the morning and at bedtime. Patient not taking: No sig reported 02/03/21   Noreene Larsson, NP  nystatin (MYCOSTATIN/NYSTOP) powder Apply 1 application topically 2 (two) times daily. Patient not taking: Reported on 06/19/2021    [provider]  Oxycodone HCl 10 MG TABS Take 1 tablet (10 mg total) by mouth 4 (four) times daily as needed (pain). Patient not taking: Reported on 06/19/2021 02/01/17   [name], MD  sertraline (ZOLOFT) 50 MG tablet Take 50 mg by mouth daily. Patient not taking: Reported on 06/19/2021 04/16/19   [provider]  insulin lispro (HUMALOG) 100 UNIT/ML injection Inject 60 Units into the skin 2 (two) times daily.    03/08/12  [provider]    Physical Exam: BP 122/66   Pulse 76   Temp 97.8 F (36.6 C) (Oral)   Resp 19   Ht _0  (1.651 m)   Wt 135 kg   SpO2 91%   BMI 49.53 kg/m   General: Middle-age female. Awake and alert and oriented x3. No acute cardiopulmonary distress.  HEENT: Normocephalic atraumatic.  Right and left ears normal in appearance.  Pupils  equal, round, reactive to light. Extraocular muscles are intact. Sclerae anicteric and noninjected.  Moist mucosal membranes. No mucosal lesions.  Neck: Neck supple without lymphadenopathy. No carotid bruits. No masses palpated.  Cardiovascular: Regular rate with normal S1-S2 sounds. No murmurs, rubs, gallops auscultated. No JVD.  Respiratory: Good respiratory effort with no wheezes, rales, rhonchi. Lungs clear to auscultation bilaterally.  No accessory muscle use. Abdomen: Soft, nontender, nondistended. Active bowel sounds. No masses or hepatosplenomegaly  Skin: Thickened, erythematous, and warm skin to the posterior thigh, extending up to the buttock, as well as to the medial thigh.These areas are tender to touch.  Dry, warm to touch. 2+ dorsalis pedis and radial pulses. Musculoskeletal: No calf or leg pain. All major joints not erythematous nontender.  No upper or lower joint deformation.  Good ROM.  No contractures  Psychiatric: Intact judgment and insight. Pleasant and cooperative. Neurologic: No focal neurological deficits. Strength is 5/5 and symmetric in upper and lower extremities.  Cranial nerves II through XII are grossly intact.           Labs on Admission: I have personally reviewed following labs and imaging studies  CBC: Recent Labs  Lab 06/19/21 1225  WBC 7.0  NEUTROABS 4.9  HGB 11.4*  HCT 36.7  MCV 98.4  PLT 423   Basic Metabolic Panel: Recent Labs  Lab 06/19/21 1225  NA 135  K 2.5*  CL 95*  CO2 29  GLUCOSE 96  BUN 19  CREATININE 4.53*  CALCIUM 8.3*   GFR: Estimated Creatinine Clearance: 19.8 mL/min (A) (by C-G formula based on SCr of 4.53  mg/dL (H)). Liver Function Tests: Recent Labs  Lab 06/19/21 1815  AST 15  ALT 14  ALKPHOS 327*  BILITOT 0.5  PROT 6.6  ALBUMIN 3.3*   No results for input(s): LIPASE, AMYLASE in the last 168 hours. No results for input(s): AMMONIA in the last 168 hours. Coagulation Profile: Recent Labs  Lab 06/19/21 1815   INR 1.1   Cardiac Enzymes: No results for input(s): CKTOTAL, CKMB, CKMBINDEX, TROPONINI in the last 168 hours. BNP (last 3 results) No results for input(s): PROBNP in the last 8760 hours. HbA1C: No results for input(s): HGBA1C in the last 72 hours. CBG: No results for input(s): GLUCAP in the last 168 hours. Lipid Profile: No results for input(s): CHOL, HDL, LDLCALC, TRIG, CHOLHDL, LDLDIRECT in the last 72 hours. Thyroid Function Tests: No results for input(s): TSH, T4TOTAL, FREET4, T3FREE, THYROIDAB in the last 72 hours. Anemia Panel: No results for input(s): VITAMINB12, FOLATE, FERRITIN, TIBC, IRON, RETICCTPCT in the last 72 hours. Urine analysis:    Component Value Date/Time   COLORURINE YELLOW 08/11/2016 1800   APPEARANCEUR HAZY (A) 08/11/2016 1800   LABSPEC 1.020 08/11/2016 1800   PHURINE 5.5 08/11/2016 1800   GLUCOSEU NEGATIVE 08/11/2016 1800   HGBUR MODERATE (A) 08/11/2016 1800   BILIRUBINUR NEGATIVE 08/11/2016 1800   KETONESUR NEGATIVE 08/11/2016 1800   PROTEINUR 100 (A) 08/11/2016 1800   UROBILINOGEN 0.2 12/02/2014 1925   NITRITE NEGATIVE 08/11/2016 1800   LEUKOCYTESUR NEGATIVE 08/11/2016 1800   Sepsis Labs: _0 (procalcitonin:4,lacticidven:4) ) Recent Results (from the past 240 hour(s))  Culture, blood (routine x 2)     Status: None (Preliminary result)   Collection Time: 06/19/21  2:38 PM   Specimen: Blood  Result Value Ref Range Status   Specimen Description BLOOD RIGHT ARM  Final   Special Requests   Final    BOTTLES DRAWN AEROBIC AND ANAEROBIC Blood Culture adequate volume Performed at [facility], [address], [place], [place] [address]    Culture PENDING  Incomplete   Report Status PENDING  Incomplete     Radiological Exams on Admission: CT ABDOMEN PELVIS WO CONTRAST  Result Date: 06/19/2021 CLINICAL DATA:  Abdominal and pelvic cellulitis.  Dialysis patient. EXAM: CT ABDOMEN AND PELVIS WITHOUT CONTRAST TECHNIQUE: Multidetector CT imaging of  the abdomen and pelvis was performed following the standard protocol without IV contrast. COMPARISON:  04/20/2019 FINDINGS: Lower chest: No acute findings. Hepatobiliary: No mass visualized on this unenhanced exam. Prior cholecystectomy. No evidence of biliary obstruction. Pancreas: No mass or inflammatory process visualized on this unenhanced exam. Spleen:  Within normal limits in size. Adrenals/Urinary tract: A few tiny 1-2 mm calculi are noted in the upper poles of both kidneys. No evidence of ureteral calculi or hydronephrosis. Empty urinary bladder. Stomach/Bowel: No evidence of obstruction, inflammatory process, or abnormal fluid collections. Diffuse colonic diverticulosis noted, however there is no evidence of diverticulitis. Vascular/Lymphatic: Stable 12 mm left common iliac lymph node. No other pathologically enlarged lymph nodes identified. Aortic atherosclerotic calcification noted. No evidence of abdominal aortic aneurysm. Reproductive: Prior hysterectomy noted. A cystic lesion is seen in the right adnexa which measures 3.2 x 2.6 cm. This is decreased in size compared to 5.6 x 3.5 cm previously, consistent with benign etiology. No evidence of ascites. Other: Skin thickening and edema of subcutaneous tissues is seen in the left lower quadrant anterior and lateral abdominal wall, consistent with cellulitis. There is no evidence of soft tissue gas or abscess. Musculoskeletal: Multiple Schmorl's nodes are noted in the lumbosacral  vertebral bodies. No suspicious bone lesions identified. IMPRESSION: Findings compatible with cellulitis in left anterior lateral abdominal wall soft tissues. No evidence of soft tissue gas or abscess. Tiny bilateral renal calculi. No evidence of ureteral calculi or hydronephrosis. Colonic diverticulosis. No radiographic evidence of diverticulitis. Decreased size of right adnexal cystic lesion, consistent with benign etiology. Aortic Atherosclerosis (ICD10-I70.0). Electronically  Signed   By: Marlaine Hind M.D.   On: 06/19/2021 16:23    EKG: Independently reviewed.  Sinus rhythm with borderline right axis deviation.  Low voltage in the lateral leads.  No acute ST changes.  Assessment/Plan: Principal Problem:   Cellulitis Active Problems:   Lymphedema of lower extremity   Diabetes mellitus (HCC)   ESRD (end stage renal disease) ([place])   Hypokalemia   Essential (primary) hypertension   Morbid (severe) obesity due to excess calories [facility])    This patient was discussed with the ED physician, including pertinent vitals, physical exam findings, labs, and imaging.  We also discussed care given by the ED provider.  Cellulitis Continue Ancef 2 g, add clindamycin to cover for MRSA Check MRSA by PCR Check CBC in the morning Diabetes CBGs before meals and nightly Sliding scale insulin End-stage renal disease on dialysis Had dialysis today.  Repeat dialysis on regular interval if patient remains in the hospital. Hypokalemia Potassium replaced Recheck potassium in the morning Hyper tension Continue antihypertensives Lymphedema  DVT prophylaxis: Heparin Consultants: Code Status: Full code Family Communication: None Disposition Plan: Patient should be able to return home following admission   Truett Mainland, DO

## 2021-06-19 NOTE — ED Triage Notes (Signed)
Pt to er room number 20 via ems, per ems pt is here from dialysis for a cellulitis in her R leg.  Pt states that she had a complete run of her dialysis today, states that her dialysis md told her to come to the er for swelling and cellulitis in her R leg, pt has significant swelling in her R leg.

## 2021-06-19 NOTE — ED Notes (Signed)
Zackowski, MD informed of K+ 2.5

## 2021-06-19 NOTE — ED Provider Notes (Signed)
[facility] EMERGENCY DEPARTMENT Provider Note   CSN: [id number] Arrival date & time: 06/19/21  1155     History Chief Complaint  Patient presents with   Cellulitis    [name] is a 55 y.o. female  who presents via EMS from dialysis center for evaluation of sending edema and pain in her right leg extending up into her hip.  She has been on antibiotics for cellulitis of the right thigh since 6/23, cephalexin per her PCP.  She states that her pain has extended into her medial (started in the lateral thigh), and now extending up into her right hip and buttock with significant swelling and firmness to palpation.  She endorses some chills but denies fevers, chest pain, shortness of breath, palpitations, nausea, vomiting, diarrhea, dysuria, hematuria.  I personally reviewed this patient's medical records.  She is dialysis dependent, has chronic lymphedema of the lower extremities, history of Chiari malformation status postsurgery, CHF, history of thyroid cancer, and COPD.  She is not anticoagulated. Obesity BMI of 49.  HPI     Past Medical History:  Diagnosis Date   Allergy    Phreesia 11/04/2020   Anemia, unspecified    Anxiety    Arthritis    Phreesia 11/04/2020   Asthma    as a child   Cancer ([place])    thyroid   Cellulitis    CHF (congestive heart failure) ([place])    Chiari malformation    s/p surgery   Chiari malformation    Chronic kidney disease    Phreesia 11/04/2020   Chronic kidney disease, stage 5 (HCC)    Chronic obstructive pulmonary disease, unspecified ([place])    Chronic pain    Complication of anesthesia 11/28/15   Resp arrest after  conscious  sedation   Compression of brain (HCC)    Constipation    COPD (chronic obstructive pulmonary disease) ([place])    Phreesia 11/04/2020   Coronary artery disease    40-50% mid LAD 04/29/09, Medical tx. (Dr. Gwenlyn Found)   Diabetes mellitus    Type II- reports being off all medication d/t it being controlled   Diabetes  mellitus without complication ([place])    Phreesia 11/04/2020   Essential (primary) hypertension    Fibromyalgia    Fibromyalgia    History of blood transfusion    hemorrage duinrg pregancy   Hx of echocardiogram 10/2011   EF 55-60%   Hypercholesterolemia    Hyperlipidemia, unspecified    Hypertension    Hypertensive chronic kidney disease with stage 1 through stage 4 chronic kidney disease, or unspecified chronic kidney disease    Hypertensive chronic kidney disease with stage 5 chronic kidney disease or end stage renal disease (HCC)    Lymph edema    Lymphedema, not elsewhere classified    Morbid (severe) obesity due to excess calories (HCC)    Obesity hypoventilation syndrome ([place])    On home oxygen therapy    "2L; 24/7" (12/21/2018)   Peritonitis, unspecified ([place])    Pneumonia    in past   Pulmonary hypertension ([place])    Renal disorder    M/W/F [facility] started dialysis in Dec.2016   S/P colonoscopy 05/26/2007   Dr. Laural Golden sigmoid diverticulosis random biopsies benign   S/P endoscopy 05/01/2009   Dr. Penelope Coop pill-induced esophageal ulcerations distal to midesophagus, 2 small ulcers in the antrum of the stomach   Shortness of breath dyspnea    with any exertion or if heart rate  is irregular  while on dialysis   Sleep apnea    reports that she no longer needs CPAP due to weight loss   Sleep apnea, unspecified    Type 2 diabetes mellitus with diabetic nephropathy ([place])    Type 2 diabetes mellitus with hyperglycemia [facility])     Patient Active Problem List   Diagnosis Date Noted   COVID-19 02/03/2021   Enteritis, enteropathogenic E. coli 07/02/2020   Bacteremia 07/01/2020   Sepsis due to undetermined organism ([place]) 06/30/2020   Anemia, unspecified    Essential (primary) hypertension    Hyperlipidemia, unspecified    Type 2 diabetes mellitus with hyperglycemia (HCC)    Chronic kidney disease, stage 5 ([place])    Chronic obstructive pulmonary disease, unspecified  (HCC)    Fibromyalgia    Hypertensive chronic kidney disease with stage 1 through stage 4 chronic kidney disease, or unspecified chronic kidney disease    Hypertensive chronic kidney disease with stage 5 chronic kidney disease or end stage renal disease (HCC)    Lymphedema, not elsewhere classified    Morbid (severe) obesity due to excess calories ([place])    Peritonitis, unspecified ([place])    Type 2 diabetes mellitus with diabetic nephropathy (HCC)    Abdominal pain 04/20/2019   Peritonitis ([place]) 12/21/2018   Abdominal pain, LLQ 12/21/2018   Influenza B 12/21/2018   SOB (shortness of breath) 06/27/2018   Hypokalemia 06/27/2018   Chronic renal failure    Septic shock ([place]) 01/24/2017   Shock liver 01/24/2017   Cor pulmonale, chronic (HCC) 01/24/2017   Elevated troponin    Flu-like symptoms 01/18/2017   Fever 01/18/2017   HCAP (healthcare-associated pneumonia) 12/31/2016   Numbness and tingling in right hand 08/13/2016   Sepsis ([place]) 08/11/2016   Coagulase negative Staphylococcus bacteremia 06/11/2016   ESRD (end stage renal disease) ([place]) 06/06/2016   Chronic cholecystitis 12/09/2015   Protein-calorie malnutrition, severe 11/29/2015   Respiratory arrest ([place]) 11/28/2015   Cellulitis 11/13/2015   Recurrent cellulitis of lower leg 11/13/2015   Chronic diastolic CHF (congestive heart failure) ([place]) 11/13/2015   Chronic respiratory failure with hypoxia ([place]) 11/13/2015   ESRD on dialysis ([place]) 11/13/2015   Anemia of chronic disease 11/13/2015   Rotator cuff syndrome of right shoulder 05/30/2013   Pain in joint, shoulder region 05/30/2013   Obesity hypoventilation syndrome ([place]) 09/04/2012   Hyperkalemia 11/11/2011   Leukopenia 11/11/2011   HTN (hypertension) 11/11/2011   HLD (hyperlipidemia) 11/11/2011   Transaminitis 11/11/2011   Diabetes mellitus ([place]) 10/27/2011   Morbid obesity ([place]) 10/27/2011   Lymphedema of lower extremity 10/04/2011    Past Surgical History:   Procedure Laterality Date   .Hemodialysis catheter Right 11/28/2015   A/V FISTULAGRAM N/A 07/26/2017   Procedure: A/V Fistulagram - Left Arm;  Surgeon: Serafina Mitchell, MD;  Location: [place] [facility];  Service: Cardiovascular;  Laterality: N/A;   ABDOMINAL HYSTERECTOMY     total hysterectomy   ADENOIDECTOMY     AV FISTULA PLACEMENT Left 03/15/2016   Procedure: CREATION OF LEFT ARM ARTERIOVENOUS (AV) FISTULA  ;  Surgeon: Rosetta Posner, MD;  Location: [facility];  Service: Vascular;  Laterality: Left;   CAPD INSERTION N/A 08/30/2017   Procedure: LAPAROSCOPIC INSERTION CONTINUOUS AMBULATORY PERITONEAL DIALYSIS  (CAPD) CATHETER;  Surgeon: Clovis Riley, MD;  Location: [facility];  Service: General;  Laterality: N/A;   CESAREAN SECTION      x 2   CESAREAN SECTION N/A    Phreesia 11/04/2020   CRANIECTOMY SUBOCCIPITAL W/  CERVICAL LAMINECTOMY / CHIARI     FISTULA SUPERFICIALIZATION Left 05/10/2016   Procedure: Left Arm FISTULA SUPERFICIALIZATION;  Surgeon: Rosetta Posner, MD;  Location: [facility];  Service: Vascular;  Laterality: Left;   IR GENERIC HISTORICAL  08/14/2016   IR REMOVAL TUN ACCESS W/ PORT W/O FL MOD SED 08/14/2016 Arne Cleveland, MD [facility]INTERV RAD   IR SINUS/FIST TUBE CHK-NON GI  01/01/2019   IR US GUIDE BX ASP/DRAIN  12/28/2018   MINOR REMOVAL OF PERITONEAL DIALYSIS CATHETER N/A 04/26/2019   Procedure: REMOVAL OF INFECTED PERITONEAL DIALYSIS CATHETER;  Surgeon: Erroll Luna, MD;  Location: [facility];  Service: General;  Laterality: N/A;   PERIPHERAL VASCULAR BALLOON ANGIOPLASTY Left 10/18/2019   Procedure: PERIPHERAL VASCULAR BALLOON ANGIOPLASTY;  Surgeon: Marty Heck, MD;  Location: [place] [facility];  Service: Cardiovascular;  Laterality: Left;  arm fistula   PERIPHERAL VASCULAR CATHETERIZATION Left 11/25/2016   Procedure: A/V Fistulagram;  Surgeon: Conrad [place]ville, MD;  Location: [facility];  Service: Cardiovascular;  Laterality: Left;  arm   PORTACATH PLACEMENT  07/05/2012   Procedure:  INSERTION PORT-A-CATH;  Surgeon: Donato Heinz, MD;  Location: [facility];  Service: General;  Laterality: Left;  subclavian   portacath removal     RIGHT HEART CATH N/A 08/04/2017   Procedure: RIGHT HEART CATH;  Surgeon: Jolaine Artist, MD;  Location: [facility];  Service: Cardiovascular;  Laterality: N/A;   THYROIDECTOMY, PARTIAL     TONSILLECTOMY       OB History     Gravida  4   Para  3   Term  2   Preterm  1   AB  1   Living         SAB  1   IAB      Ectopic      Multiple      Live Births              Family History  Problem Relation Age of Onset   Colon cancer Mother 65   Stroke Mother 55   Coronary artery disease Mother     Social History   Tobacco Use   Smoking status: Never   Smokeless tobacco: Never  Vaping Use   Vaping Use: Never used  Substance Use Topics   Alcohol use: No    Alcohol/week: 0.0 standard drinks   Drug use: No    Home Medications Prior to Admission medications   Medication Sig Start Date End Date Taking? Authorizing Provider  acetaminophen (TYLENOL) 325 MG tablet Take 2 tablets (650 mg total) by mouth every 4 (four) hours as needed for mild pain (or Fever >/= 101). 07/04/20  Yes Emokpae, Courage, MD  calcium carbonate (TUMS - DOSED IN MG ELEMENTAL CALCIUM) 500 MG chewable tablet Chew 2 tablets by mouth 2 (two) times daily.   Yes [provider]  cephALEXin (KEFLEX) 500 MG capsule Take 1 capsule (500 mg total) by mouth 2 (two) times daily. Try to take this every 12 hours. On dialysis days, take the first dose after dialysis and the second at bedtime. 06/09/21  Yes Noreene Larsson, NP  diphenhydrAMINE (BENADRYL) 25 mg capsule Take 25 mg by mouth daily as needed for allergies.    Yes [provider]  Doxercalciferol (HECTOROL PO) Take 60 mg by mouth See admin instructions. Take on Mon,Wed, and Friday at dialysis   Yes [provider]  ibuprofen (ADVIL) 200 MG tablet Take 400 mg  by mouth every 6  (six) hours as needed for moderate pain.   Yes [provider]  lactulose (CHRONULAC) 10 GM/15ML solution Take 45 mLs (30 g total) by mouth 2 (two) times daily. For constipation 04/30/19  Yes Rai, Ripudeep K, MD  lanthanum (FOSRENOL) 500 MG chewable tablet Chew 1,000-1,500 mg by mouth 5 (five) times daily. 1500 mg by mouth three times a day with meals and 1000 mg by mouth twice a day with snacks   Yes [provider]  loperamide (IMODIUM) 2 MG capsule Take 2-4 mg by mouth as needed for diarrhea or loose stools.    Yes [provider]  multivitamin (RENA-VIT) TABS tablet Take 1 tablet by mouth daily. 05/30/18  Yes [provider]  Sennosides (EX-LAX PO) Take 2 tablets by mouth daily as needed.   Yes [provider]  albuterol (PROVENTIL) (2.5 MG/3ML) 0.083% nebulizer solution Take 2.5 mg by nebulization every 6 (six) hours as needed for wheezing or shortness of breath. Patient not taking: Reported on 06/19/2021    [provider]  albuterol (VENTOLIN HFA) 108 (90 Base) MCG/ACT inhaler Inhale 2 puffs into the lungs 4 (four) times daily. Patient not taking: Reported on 06/19/2021    [provider]  ALPRAZolam Duanne Moron) 0.5 MG tablet Take 0.5 mg by mouth 2 (two) times daily as needed for anxiety. Patient not taking: Reported on 06/19/2021    [provider]  amLODipine (NORVASC) 2.5 MG tablet Take 1 tablet (2.5 mg total) by mouth daily. Hold it on your hemodialysis- Patient not taking: No sig reported 07/04/20   Roxan Hockey, MD  calcium acetate (PHOSLO) 667 MG capsule Take 1,334 mg by mouth 3 (three) times daily. 05/27/21   [provider]  guaiFENesin-dextromethorphan (ROBITUSSIN DM) 100-10 MG/5ML syrup Take 15 mLs by mouth every 6 (six) hours as needed for cough. Patient not taking: No sig reported 02/03/21   Noreene Larsson, NP  insulin detemir (LEVEMIR) 100 UNIT/ML injection Inject 0.03 mLs (3 Units total) into the skin  daily. Patient not taking: Reported on 06/19/2021 04/30/19   Rai, Vernelle Emerald, MD  Melatonin 5 MG CAPS Take 5 mg by mouth at bedtime as needed (sleep). Patient not taking: Reported on 06/19/2021    [provider]  midodrine (PROAMATINE) 10 MG tablet Take 1 tablet (10 mg total) by mouth 2 (two) times daily with a meal. --Always take it (do Not hold it)  before hemodialysis to avoid low blood pressure Patient not taking: No sig reported 07/04/20   Roxan Hockey, MD  Molnupiravir 200 MG CAPS Take 4 capsules (800 mg total) by mouth in the morning and at bedtime. Patient not taking: No sig reported 02/03/21   Noreene Larsson, NP  nystatin (MYCOSTATIN/NYSTOP) powder Apply 1 application topically 2 (two) times daily. Patient not taking: Reported on 06/19/2021    [provider]  Oxycodone HCl 10 MG TABS Take 1 tablet (10 mg total) by mouth 4 (four) times daily as needed (pain). Patient not taking: Reported on 06/19/2021 02/01/17   [name], MD  sertraline (ZOLOFT) 50 MG tablet Take 50 mg by mouth daily. Patient not taking: Reported on 06/19/2021 04/16/19   [provider]  insulin lispro (HUMALOG) 100 UNIT/ML injection Inject 60 Units into the skin 2 (two) times daily.    03/08/12  [provider]    Allergies    Contrast media [iodinated diagnostic agents], Pneumococcal vaccines, and Vancomycin  Review of Systems   Review  of Systems  Constitutional:  Positive for chills. Negative for activity change, appetite change, diaphoresis, fatigue, fever and unexpected weight change.  HENT: Negative.    Respiratory: Negative.    Cardiovascular: Negative.   Gastrointestinal: Negative.   Genitourinary: Negative.   Musculoskeletal:  Positive for myalgias.       LE edema, pain  Skin:  Positive for color change.  Neurological: Negative.   Hematological: Negative.    Physical Exam Updated Vital Signs BP (!) 145/80   Pulse 72   Temp 97.8 F (36.6 C) (Oral)   Resp 20    Ht _0  (1.651 m)   Wt 135 kg   SpO2 100%   BMI 49.53 kg/m   Physical Exam Vitals and nursing note reviewed.  Constitutional:      Appearance: She is morbidly obese. She is not toxic-appearing.  HENT:     Head: Normocephalic and atraumatic.     Nose: Nose normal. No congestion.     Mouth/Throat:     Mouth: Mucous membranes are moist.     Pharynx: Oropharynx is clear. Uvula midline. No oropharyngeal exudate, posterior oropharyngeal erythema or uvula swelling.     Tonsils: No tonsillar exudate.  Eyes:     General: Lids are normal. Vision grossly intact.        Right eye: No discharge.        Left eye: No discharge.     Extraocular Movements: Extraocular movements intact.     Conjunctiva/sclera: Conjunctivae normal.     Pupils: Pupils are equal, round, and reactive to light.  Neck:     Trachea: Trachea and phonation normal.  Cardiovascular:     Rate and Rhythm: Normal rate and regular rhythm.     Pulses:          Dorsalis pedis pulses are detected w/ Doppler on the right side and detected w/ Doppler on the left side.     Heart sounds: Normal heart sounds. No murmur heard. Pulmonary:     Effort: Pulmonary effort is normal. No tachypnea, bradypnea, accessory muscle usage, prolonged expiration or respiratory distress.     Breath sounds: Normal breath sounds. No wheezing or rales.  Chest:     Chest wall: No mass, lacerations, deformity, swelling, tenderness, crepitus or edema.  Abdominal:     General: Abdomen is protuberant. Bowel sounds are normal. There is no distension.     Palpations: Abdomen is soft.     Tenderness: There is no abdominal tenderness. There is no right CVA tenderness or left CVA tenderness.  Musculoskeletal:        General: No deformity.     Cervical back: Normal range of motion and neck supple. No edema, rigidity, tenderness or crepitus. No pain with movement or spinous process tenderness.     Right lower leg: 4+ Edema present.     Left lower leg: 4+ Edema  present.       Legs:  Lymphadenopathy:     Cervical: No cervical adenopathy.  Skin:    General: Skin is warm and dry.     Capillary Refill: Capillary refill takes less than 2 seconds.     Comments: Significant lymphedema of the lower extremities bilaterally with chronic thickening of the skin.  Neurological:     Mental Status: She is alert and oriented to person, place, and time. Mental status is at baseline.  Psychiatric:        Mood and Affect: Mood normal.    ED Results / Procedures /  Treatments   Labs (all labs ordered are listed, but only abnormal results are displayed) Labs Reviewed  MRSA NEXT GEN BY PCR, NASAL - Abnormal; Notable for the following components:      Result Value   MRSA by PCR Next Gen DETECTED (*)    All other components within normal limits  CBC WITH DIFFERENTIAL/PLATELET - Abnormal; Notable for the following components:   RBC 3.73 (*)    Hemoglobin 11.4 (*)    All other components within normal limits  BASIC METABOLIC PANEL - Abnormal; Notable for the following components:   Potassium 2.5 (*)    Chloride 95 (*)    Creatinine, Ser 4.53 (*)    Calcium 8.3 (*)    GFR, Estimated 11 (*)    All other components within normal limits  HEPATIC FUNCTION PANEL - Abnormal; Notable for the following components:   Albumin 3.3 (*)    Alkaline Phosphatase 327 (*)    All other components within normal limits  CULTURE, BLOOD (ROUTINE X 2)  RESP PANEL BY RT-PCR (FLU A&B, COVID) ARPGX2  LACTIC ACID, PLASMA  PROTIME-INR  APTT  GLUCOSE, CAPILLARY  HEMOGLOBIN Q3F  BASIC METABOLIC PANEL  CBC    EKG None  Radiology CT ABDOMEN PELVIS WO CONTRAST  Result Date: 06/19/2021 CLINICAL DATA:  Abdominal and pelvic cellulitis.  Dialysis patient. EXAM: CT ABDOMEN AND PELVIS WITHOUT CONTRAST TECHNIQUE: Multidetector CT imaging of the abdomen and pelvis was performed following the standard protocol without IV contrast. COMPARISON:  04/20/2019 FINDINGS: Lower chest: No acute  findings. Hepatobiliary: No mass visualized on this unenhanced exam. Prior cholecystectomy. No evidence of biliary obstruction. Pancreas: No mass or inflammatory process visualized on this unenhanced exam. Spleen:  Within normal limits in size. Adrenals/Urinary tract: A few tiny 1-2 mm calculi are noted in the upper poles of both kidneys. No evidence of ureteral calculi or hydronephrosis. Empty urinary bladder. Stomach/Bowel: No evidence of obstruction, inflammatory process, or abnormal fluid collections. Diffuse colonic diverticulosis noted, however there is no evidence of diverticulitis. Vascular/Lymphatic: Stable 12 mm left common iliac lymph node. No other pathologically enlarged lymph nodes identified. Aortic atherosclerotic calcification noted. No evidence of abdominal aortic aneurysm. Reproductive: Prior hysterectomy noted. A cystic lesion is seen in the right adnexa which measures 3.2 x 2.6 cm. This is decreased in size compared to 5.6 x 3.5 cm previously, consistent with benign etiology. No evidence of ascites. Other: Skin thickening and edema of subcutaneous tissues is seen in the left lower quadrant anterior and lateral abdominal wall, consistent with cellulitis. There is no evidence of soft tissue gas or abscess. Musculoskeletal: Multiple Schmorl's nodes are noted in the lumbosacral vertebral bodies. No suspicious bone lesions identified. IMPRESSION: Findings compatible with cellulitis in left anterior lateral abdominal wall soft tissues. No evidence of soft tissue gas or abscess. Tiny bilateral renal calculi. No evidence of ureteral calculi or hydronephrosis. Colonic diverticulosis. No radiographic evidence of diverticulitis. Decreased size of right adnexal cystic lesion, consistent with benign etiology. Aortic Atherosclerosis (ICD10-I70.0). Electronically Signed   By: Marlaine Hind M.D.   On: 06/19/2021 16:23   CT FEMUR RIGHT WO CONTRAST  Result Date: 06/19/2021 CLINICAL DATA:  Cellulitis with  worsening edema. Right leg swelling. EXAM: CT OF THE LOWER RIGHT EXTREMITY WITHOUT CONTRAST TECHNIQUE: Multidetector CT imaging of the right lower extremity was performed according to the standard protocol. COMPARISON:  None. FINDINGS: Bones/Joint/Cartilage No fracture, erosion, periosteal reaction or focal bone abnormality. Moderate knee osteoarthritis. Lateral patellar tilt. Moderate hip osteoarthritis. Small  knee joint effusion. No visualized hip joint effusion. Ligaments Suboptimally assessed by CT. Muscles and Tendons Mild generalized fatty atrophy.  No intramuscular collection. Soft tissues Skin thickening about the medial and lateral aspect of the thigh. Medial skin thickening appears symmetric to the included left lower extremity were visualized. There is subcutaneous edema that is confluent medially. Multifocal soft tissue calcifications, both punctate, amorphous, slightly popcorn in appearance. Greatest degree of dystrophic calcifications are seen posterior medially. No soft tissue air. No discrete focal fluid collection. There are moderate vascular calcifications. No drainable fluid collection. IMPRESSION: 1. Skin thickening about the medial and lateral aspect of the thigh with subcutaneous edema that is confluent medially. Findings may be secondary to cellulitis or lymphedema. No soft tissue air or discrete focal fluid collection. 2. Multifocal soft tissue calcifications, both punctate, amorphous, slightly popcorn in appearance. These are likely dystrophic, likely sequela of chronic renal failure. 3. No acute osseous abnormality. Moderate knee and hip osteoarthritis. Small knee joint effusion. 4. Moderate vascular calcifications. Electronically Signed   By: Keith Rake M.D.   On: 06/19/2021 19:44   CT TIBIA FIBULA RIGHT WO CONTRAST  Result Date: 06/19/2021 CLINICAL DATA:  Cellulitis with worsening edema. EXAM: CT OF THE LOWER RIGHT EXTREMITY WITHOUT CONTRAST TECHNIQUE: Multidetector CT imaging of  the right lower extremity was performed according to the standard protocol. COMPARISON:  None. FINDINGS: Bones/Joint/Cartilage No fracture, erosion, or focal bone abnormality. No periosteal reaction. The bones appear under mineralized. Moderate knee osteoarthritis. Small knee joint effusion. Ligaments Suboptimally assessed by CT. Muscles and Tendons Mild fatty atrophy.  No intramuscular collection. Soft tissues Skin thickening about the medial and lateral aspect of the calf. Soft tissue edema about the ankle and distal aspect of the lower leg anteriorly. There are scattered soft tissue calcifications, most prominently medially, primarily punctate and amorphous. No focal fluid collection. There is no soft tissue air. Moderate vascular calcifications are seen. IMPRESSION: 1. Skin thickening about the medial and lateral aspect of the calf. Soft tissue edema about the ankle and distal aspect of the lower leg anteriorly. No focal fluid collection or soft tissue air. 2. Scattered soft tissue calcifications. These are likely dystrophic and related to chronic renal disease. 3. No acute osseous abnormality. 4. Moderate peripheral vascular calcifications. Electronically Signed   By: Keith Rake M.D.   On: 06/19/2021 19:47   CT FOOT RIGHT WO CONTRAST  Result Date: 06/19/2021 CLINICAL DATA:  Cellulitis with worsening edema. EXAM: CT OF THE RIGHT FOOT WITHOUT CONTRAST TECHNIQUE: Multidetector CT imaging of the right foot was performed according to the standard protocol. Multiplanar CT image reconstructions were also generated. COMPARISON:  None. FINDINGS: Bones/Joint/Cartilage No fracture, erosion, or periosteal reaction. Mild osteoarthritis. No bone destruction. There is no ankle joint effusion. Plantar calcaneal spur. Ligaments Suboptimally assessed by CT. Muscles and Tendons Fatty atrophy of the intrinsic musculature of the foot. Enthesopathic changes of the Achilles tendon insertion. Soft tissues Mild generalized  subcutaneous edema. No focal fluid collection. There are scattered punctate calcifications in the soft tissues. Arterial vascular calcifications are seen. IMPRESSION: 1. Mild generalized subcutaneous edema. No focal fluid collection. 2. No acute osseous abnormality. Electronically Signed   By: Keith Rake M.D.   On: 06/19/2021 19:49    Procedures Procedures   Medications Ordered in ED Medications  clindamycin (CLEOCIN) IVPB 600 mg (600 mg Intravenous New Bag/Given 06/19/21 1924)  acetaminophen (TYLENOL) tablet 650 mg (has no administration in time range)  ibuprofen (ADVIL) tablet 400 mg (has no administration  in time range)  oxyCODONE (Oxy IR/ROXICODONE) immediate release tablet 10 mg (has no administration in time range)  calcium acetate (PHOSLO) capsule 1,334 mg (has no administration in time range)  calcium carbonate (TUMS - dosed in mg elemental calcium) chewable tablet 400 mg of elemental calcium (has no administration in time range)  lactulose (CHRONULAC) 10 GM/15ML solution 30 g (has no administration in time range)  lanthanum (FOSRENOL) chewable tablet 1,500 mg (has no administration in time range)  loperamide (IMODIUM) capsule 2-4 mg (has no administration in time range)  multivitamin (RENA-VIT) tablet 1 tablet (has no administration in time range)  insulin aspart (novoLOG) injection 0-20 Units (has no administration in time range)  insulin aspart (novoLOG) injection 0-5 Units (has no administration in time range)  heparin injection 5,000 Units (has no administration in time range)  ondansetron (ZOFRAN) tablet 4 mg ( Oral See Alternative 06/19/21 2224)    Or  ondansetron (ZOFRAN) injection 4 mg (4 mg Intravenous Given 06/19/21 2224)  ceFAZolin (ANCEF) IVPB 2g/100 mL premix (has no administration in time range)  lanthanum (FOSRENOL) chewable tablet 1,000 mg (has no administration in time range)  mupirocin ointment (BACTROBAN) 2 % 1 application (has no administration in time range)   Chlorhexidine Gluconate Cloth 2 % PADS 6 each (has no administration in time range)  morphine 4 MG/ML injection 4 mg (4 mg Intravenous Given 06/19/21 1529)  potassium chloride 10 mEq in 100 mL IVPB (0 mEq Intravenous Stopped 06/19/21 1759)  ceFAZolin (ANCEF) IVPB 2g/100 mL premix (0 g Intravenous Stopped 06/19/21 1804)  morphine 4 MG/ML injection 4 mg (4 mg Intravenous Given 06/19/21 1849)    ED Course  I have reviewed the triage vital signs and the nursing notes.  Pertinent labs & imaging results that were available during my care of the patient were reviewed by me and considered in my medical decision making (see chart for details).  Clinical Course as of 06/19/21 2322  Fri Jun 19, 2021  1838 Discussed reading of CT scan with on-call radiologist, Dr. Kris Hartmann, who states that it is not possible to differentiate between infectious cellulitis and edema of the soft tissues that is not infectious.  It is possible that patient's presentation is simply progressive lymphedema of the tissues.  Have discussed this with the hospitalist who will evaluate the patient at the bedside.  I appreciate their collaboration in the care of this patient. [RS]  1962 Case discussed with hospitalist, Dr. Nehemiah Settle, who evaluated the patient at the bedside in the emergency department was agreeable to admitting her to his service.  Appreciate his collaboration care of this patient.  We will proceed with orders for MRSA swab pain clindamycin per his request. [RS]    Clinical Course User Index [RS] Halston Fairclough, Gypsy Balsam, PA-C   MDM Rules/Calculators/A&P                         55 year old female with history of diabetes and dialysis dependent Monday/Wednesday/Friday who presents with concern for progressive cellulitis.  Differential diagnosis includes is limited to cellulitis, erysipelas, abscess, necrotizing soft tissue infection, lymphedema.  Hypertensive on intake, vital signs otherwise normal.  Cardiopulmonary exam is  normal, abdominal exam with some generalized lower abdominal tenderness palpation without crepitus or skin changes.  Left lower extremity lymphedema, right lower extremity edema and firmness to palpation of the lateral and posterior right thigh extending into the buttock and right posterior low back which is exquisitely tender to palpation,  without erythema but warm to the touch.  No sign of purulence or fluctuance to suggest abscess.  Analgesia ordered.  CBC without leukocytosis, white count 7, hemoglobin at patient's baseline of 11.  BMP with hypokalemia of 2.5, will replete via IV.  EKG reassuring.  Hepatic function panel unremarkable, coag studies normal, lactic acid is negative.  CT of the abdomen pelvis as well as the lower extremity were obtained and revealed findings concerning for cellulitis versus lymphedema, however given regional distribution on physical exam, fever diagnosis of cellulitis rather than lymphedema.  Patient evaluated bedside by the hospitalist who concurs and will admit her to his service.  Appreciate collaboration of care this patient.  Jeannetta voiced understanding of her medical evaluation and treatment plan.  Each of her questions was answered to her expressed satisfaction.  Return precautions are given.  Patient is well-appearing, stable, and appropriate for discharge.  This chart was dictated using voice recognition software, Dragon. Despite the best efforts of this provider to proofread and correct errors, errors may still occur which can change documentation meaning.  Final Clinical Impression(s) / ED Diagnoses Final diagnoses:  Cellulitis of right lower extremity    Rx / DC Orders ED Discharge Orders     None        Aura Dials 06/19/21 2322    Fredia Sorrow, MD 06/21/21 [phone]

## 2021-06-20 ENCOUNTER — Observation Stay (HOSPITAL_COMMUNITY): Payer: 59

## 2021-06-20 DIAGNOSIS — L03116 Cellulitis of left lower limb: Secondary | ICD-10-CM | POA: Diagnosis not present

## 2021-06-20 DIAGNOSIS — I2721 Secondary pulmonary arterial hypertension: Secondary | ICD-10-CM | POA: Diagnosis present

## 2021-06-20 DIAGNOSIS — I959 Hypotension, unspecified: Secondary | ICD-10-CM | POA: Diagnosis not present

## 2021-06-20 DIAGNOSIS — I5082 Biventricular heart failure: Secondary | ICD-10-CM | POA: Diagnosis present

## 2021-06-20 DIAGNOSIS — L03115 Cellulitis of right lower limb: Secondary | ICD-10-CM | POA: Diagnosis not present

## 2021-06-20 DIAGNOSIS — R779 Abnormality of plasma protein, unspecified: Secondary | ICD-10-CM | POA: Diagnosis not present

## 2021-06-20 DIAGNOSIS — R6521 Severe sepsis with septic shock: Secondary | ICD-10-CM | POA: Diagnosis not present

## 2021-06-20 DIAGNOSIS — N186 End stage renal disease: Secondary | ICD-10-CM | POA: Diagnosis not present

## 2021-06-20 DIAGNOSIS — I5032 Chronic diastolic (congestive) heart failure: Secondary | ICD-10-CM | POA: Diagnosis not present

## 2021-06-20 DIAGNOSIS — A419 Sepsis, unspecified organism: Secondary | ICD-10-CM | POA: Diagnosis not present

## 2021-06-20 DIAGNOSIS — Z20822 Contact with and (suspected) exposure to covid-19: Secondary | ICD-10-CM | POA: Diagnosis not present

## 2021-06-20 DIAGNOSIS — I272 Pulmonary hypertension, unspecified: Secondary | ICD-10-CM | POA: Diagnosis not present

## 2021-06-20 DIAGNOSIS — I953 Hypotension of hemodialysis: Secondary | ICD-10-CM | POA: Diagnosis present

## 2021-06-20 DIAGNOSIS — I50812 Chronic right heart failure: Secondary | ICD-10-CM | POA: Diagnosis not present

## 2021-06-20 DIAGNOSIS — J449 Chronic obstructive pulmonary disease, unspecified: Secondary | ICD-10-CM | POA: Diagnosis not present

## 2021-06-20 DIAGNOSIS — I9589 Other hypotension: Secondary | ICD-10-CM | POA: Diagnosis not present

## 2021-06-20 DIAGNOSIS — I890 Lymphedema, not elsewhere classified: Secondary | ICD-10-CM | POA: Diagnosis not present

## 2021-06-20 DIAGNOSIS — R5381 Other malaise: Secondary | ICD-10-CM | POA: Diagnosis not present

## 2021-06-20 DIAGNOSIS — L03119 Cellulitis of unspecified part of limb: Secondary | ICD-10-CM | POA: Diagnosis not present

## 2021-06-20 DIAGNOSIS — R579 Shock, unspecified: Secondary | ICD-10-CM | POA: Diagnosis not present

## 2021-06-20 DIAGNOSIS — R570 Cardiogenic shock: Secondary | ICD-10-CM | POA: Diagnosis not present

## 2021-06-20 DIAGNOSIS — L0390 Cellulitis, unspecified: Secondary | ICD-10-CM | POA: Diagnosis present

## 2021-06-20 DIAGNOSIS — L89212 Pressure ulcer of right hip, stage 2: Secondary | ICD-10-CM | POA: Diagnosis not present

## 2021-06-20 DIAGNOSIS — Y848 Other medical procedures as the cause of abnormal reaction of the patient, or of later complication, without mention of misadventure at the time of the procedure: Secondary | ICD-10-CM | POA: Diagnosis not present

## 2021-06-20 DIAGNOSIS — I5033 Acute on chronic diastolic (congestive) heart failure: Secondary | ICD-10-CM | POA: Diagnosis not present

## 2021-06-20 DIAGNOSIS — T82590A Other mechanical complication of surgically created arteriovenous fistula, initial encounter: Secondary | ICD-10-CM | POA: Diagnosis not present

## 2021-06-20 DIAGNOSIS — E876 Hypokalemia: Secondary | ICD-10-CM | POA: Diagnosis not present

## 2021-06-20 DIAGNOSIS — J9601 Acute respiratory failure with hypoxia: Secondary | ICD-10-CM | POA: Diagnosis not present

## 2021-06-20 DIAGNOSIS — B957 Other staphylococcus as the cause of diseases classified elsewhere: Secondary | ICD-10-CM | POA: Diagnosis not present

## 2021-06-20 DIAGNOSIS — Z6841 Body Mass Index (BMI) 40.0 and over, adult: Secondary | ICD-10-CM | POA: Diagnosis not present

## 2021-06-20 DIAGNOSIS — I4892 Unspecified atrial flutter: Secondary | ICD-10-CM | POA: Diagnosis not present

## 2021-06-20 DIAGNOSIS — E662 Morbid (severe) obesity with alveolar hypoventilation: Secondary | ICD-10-CM | POA: Diagnosis present

## 2021-06-20 DIAGNOSIS — Z9189 Other specified personal risk factors, not elsewhere classified: Secondary | ICD-10-CM | POA: Diagnosis not present

## 2021-06-20 DIAGNOSIS — E872 Acidosis: Secondary | ICD-10-CM | POA: Diagnosis not present

## 2021-06-20 DIAGNOSIS — I361 Nonrheumatic tricuspid (valve) insufficiency: Secondary | ICD-10-CM | POA: Diagnosis not present

## 2021-06-20 DIAGNOSIS — R262 Difficulty in walking, not elsewhere classified: Secondary | ICD-10-CM | POA: Diagnosis not present

## 2021-06-20 DIAGNOSIS — J9622 Acute and chronic respiratory failure with hypercapnia: Secondary | ICD-10-CM | POA: Diagnosis not present

## 2021-06-20 DIAGNOSIS — E1165 Type 2 diabetes mellitus with hyperglycemia: Secondary | ICD-10-CM | POA: Diagnosis present

## 2021-06-20 DIAGNOSIS — R7881 Bacteremia: Secondary | ICD-10-CM | POA: Diagnosis not present

## 2021-06-20 DIAGNOSIS — E871 Hypo-osmolality and hyponatremia: Secondary | ICD-10-CM | POA: Diagnosis not present

## 2021-06-20 DIAGNOSIS — I10 Essential (primary) hypertension: Secondary | ICD-10-CM | POA: Diagnosis not present

## 2021-06-20 DIAGNOSIS — D631 Anemia in chronic kidney disease: Secondary | ICD-10-CM | POA: Diagnosis present

## 2021-06-20 DIAGNOSIS — R748 Abnormal levels of other serum enzymes: Secondary | ICD-10-CM | POA: Diagnosis not present

## 2021-06-20 DIAGNOSIS — E861 Hypovolemia: Secondary | ICD-10-CM | POA: Diagnosis not present

## 2021-06-20 DIAGNOSIS — Z992 Dependence on renal dialysis: Secondary | ICD-10-CM | POA: Diagnosis not present

## 2021-06-20 DIAGNOSIS — I5043 Acute on chronic combined systolic (congestive) and diastolic (congestive) heart failure: Secondary | ICD-10-CM | POA: Diagnosis not present

## 2021-06-20 DIAGNOSIS — K761 Chronic passive congestion of liver: Secondary | ICD-10-CM | POA: Diagnosis present

## 2021-06-20 DIAGNOSIS — A411 Sepsis due to other specified staphylococcus: Secondary | ICD-10-CM | POA: Diagnosis not present

## 2021-06-20 DIAGNOSIS — E1122 Type 2 diabetes mellitus with diabetic chronic kidney disease: Secondary | ICD-10-CM | POA: Diagnosis not present

## 2021-06-20 DIAGNOSIS — R7989 Other specified abnormal findings of blood chemistry: Secondary | ICD-10-CM | POA: Diagnosis not present

## 2021-06-20 DIAGNOSIS — M79662 Pain in left lower leg: Secondary | ICD-10-CM | POA: Diagnosis not present

## 2021-06-20 DIAGNOSIS — I50811 Acute right heart failure: Secondary | ICD-10-CM | POA: Diagnosis not present

## 2021-06-20 DIAGNOSIS — I132 Hypertensive heart and chronic kidney disease with heart failure and with stage 5 chronic kidney disease, or end stage renal disease: Secondary | ICD-10-CM | POA: Diagnosis not present

## 2021-06-20 DIAGNOSIS — I071 Rheumatic tricuspid insufficiency: Secondary | ICD-10-CM | POA: Diagnosis not present

## 2021-06-20 DIAGNOSIS — Z7189 Other specified counseling: Secondary | ICD-10-CM | POA: Diagnosis not present

## 2021-06-20 DIAGNOSIS — I50810 Right heart failure, unspecified: Secondary | ICD-10-CM | POA: Diagnosis not present

## 2021-06-20 DIAGNOSIS — I950 Idiopathic hypotension: Secondary | ICD-10-CM | POA: Diagnosis not present

## 2021-06-20 DIAGNOSIS — M79661 Pain in right lower leg: Secondary | ICD-10-CM | POA: Diagnosis not present

## 2021-06-20 DIAGNOSIS — I2729 Other secondary pulmonary hypertension: Secondary | ICD-10-CM | POA: Diagnosis not present

## 2021-06-20 DIAGNOSIS — R778 Other specified abnormalities of plasma proteins: Secondary | ICD-10-CM | POA: Diagnosis not present

## 2021-06-20 DIAGNOSIS — J9621 Acute and chronic respiratory failure with hypoxia: Secondary | ICD-10-CM | POA: Diagnosis not present

## 2021-06-20 DIAGNOSIS — Z515 Encounter for palliative care: Secondary | ICD-10-CM | POA: Diagnosis not present

## 2021-06-20 LAB — SEDIMENTATION RATE: Sed Rate: 58 mm/hr — ABNORMAL HIGH (ref 0–22)

## 2021-06-20 LAB — CBC
HCT: 39.7 % (ref 36.0–46.0)
HCT: 36.8 % (ref 36.0–46.0)
Hemoglobin: 12.4 g/dL (ref 12.0–15.0)
Hemoglobin: 11.7 g/dL — ABNORMAL LOW (ref 12.0–15.0)
MCH: 32 pg (ref 26.0–34.0)
MCH: 32.2 pg (ref 26.0–34.0)
MCHC: 31.2 g/dL (ref 30.0–36.0)
MCHC: 31.8 g/dL (ref 30.0–36.0)
MCV: 102.6 fL — ABNORMAL HIGH (ref 80.0–100.0)
MCV: 101.4 fL — ABNORMAL HIGH (ref 80.0–100.0)
Platelets: 176 10*3/uL (ref 150–400)
Platelets: 179 10*3/uL (ref 150–400)
RBC: 3.87 MIL/uL (ref 3.87–5.11)
RBC: 3.63 MIL/uL — ABNORMAL LOW (ref 3.87–5.11)
RDW: 14.7 % (ref 11.5–15.5)
RDW: 14.6 % (ref 11.5–15.5)
WBC: 8.9 10*3/uL (ref 4.0–10.5)
WBC: 7.2 10*3/uL (ref 4.0–10.5)
nRBC: 0 % (ref 0.0–0.2)
nRBC: 0 % (ref 0.0–0.2)

## 2021-06-20 LAB — BASIC METABOLIC PANEL
Anion gap: 17 — ABNORMAL HIGH (ref 5–15)
Anion gap: 15 (ref 5–15)
BUN: 25 mg/dL — ABNORMAL HIGH (ref 6–20)
BUN: 26 mg/dL — ABNORMAL HIGH (ref 6–20)
CO2: 25 mmol/L (ref 22–32)
CO2: 27 mmol/L (ref 22–32)
Calcium: 7.4 mg/dL — ABNORMAL LOW (ref 8.9–10.3)
Calcium: 6.8 mg/dL — ABNORMAL LOW (ref 8.9–10.3)
Chloride: 93 mmol/L — ABNORMAL LOW (ref 98–111)
Chloride: 91 mmol/L — ABNORMAL LOW (ref 98–111)
Creatinine, Ser: 5.72 mg/dL — ABNORMAL HIGH (ref 0.44–1.00)
Creatinine, Ser: 6.14 mg/dL — ABNORMAL HIGH (ref 0.44–1.00)
GFR, Estimated: 8 mL/min — ABNORMAL LOW (ref >60)
GFR, Estimated: 8 mL/min — ABNORMAL LOW (ref >60)
Glucose, Bld: 117 mg/dL — ABNORMAL HIGH (ref 70–99)
Glucose, Bld: 119 mg/dL — ABNORMAL HIGH (ref 70–99)
Potassium: 3.9 mmol/L (ref 3.5–5.1)
Potassium: 3.6 mmol/L (ref 3.5–5.1)
Sodium: 135 mmol/L (ref 135–145)
Sodium: 133 mmol/L — ABNORMAL LOW (ref 135–145)

## 2021-06-20 LAB — GLUCOSE, CAPILLARY
Glucose-Capillary: 110 mg/dL — ABNORMAL HIGH (ref 70–99)
Glucose-Capillary: 100 mg/dL — ABNORMAL HIGH (ref 70–99)
Glucose-Capillary: 112 mg/dL — ABNORMAL HIGH (ref 70–99)
Glucose-Capillary: 99 mg/dL (ref 70–99)

## 2021-06-20 MED ORDER — ONDANSETRON HCL 4 MG/2ML IJ SOLN: 4.0000 mg | Freq: Four times a day (QID) | INTRAMUSCULAR | Status: DC

## 2021-06-20 MED ORDER — SODIUM CHLORIDE 0.9 % IV SOLN
12.5000 mg | Freq: Once | INTRAVENOUS | Status: AC
  Administered 2021-06-20: 12.5 mg via INTRAVENOUS
  Filled 2021-06-20: qty 0.5

## 2021-06-20 MED ORDER — SODIUM CHLORIDE 0.9 % IV SOLN
12.5000 mg | Freq: Four times a day (QID) | INTRAVENOUS | Status: DC | PRN
  Filled 2021-06-20: qty 0.5

## 2021-06-20 MED ORDER — PROCHLORPERAZINE EDISYLATE 10 MG/2ML IJ SOLN
10.0000 mg | Freq: Four times a day (QID) | INTRAMUSCULAR | Status: DC
  Administered 2021-06-20 – 2021-06-21 (×3): 10 mg via INTRAVENOUS
  Filled 2021-06-20 (×3): qty 2

## 2021-06-20 NOTE — Progress Notes (Signed)
PROGRESS NOTE  [name] OHF:[id number] DOB: 09/12/1966 DOA: 06/19/2021 PCP: Noreene Larsson, NP  Brief History:  55 year old female with a history of COPD, lymphedema, coronary disease, diabetes mellitus type 2, hypertension, hyperlipidemia, morbid obesity, ESRD (MWF), OSA presenting with right leg pain and edema that started over 10 days prior to this admission.  Apparently, the patient noted pain and edema to the lateral aspect of her right lower thigh approximately 10 days ago.  She contacted her PCP who started the patient on cephalexin.  She felt that it may have been some what better.  However in the past 3-4 days she has noted increasing edema and pain on the medial aspect of her right thigh extending to the upper lateral aspect.  She denies any recent falling or trauma.  She denies any open or draining wounds.  She states that her lower leg edema has been about the same as usual.  She has not had any fevers, chills, chest pain, shortness of breath, cough, hemoptysis.  She denies any abdominal pain, diarrhea, hematochezia, melena.  She denies any new medications aside from the cephalexin.  She states that she actually has been using a new " wedge pillow" that she has placed underneath her lower legs approximately 5 to 6 days prior to this admission.  She feels that it raises her lower legs below her knees at least 6 to 12 inches.  At baseline, the patient is essentially bedbound/wheelchair bound.  She states that she is able to take 1 or 2 steps with a walker to make a transfer.  She spends at least 50% of her time in bed, sometimes even more during her dialysis days. In the emergency department, the patient was afebrile hemodynamically stable with oxygen saturation 96% on room air.  WBC 7.0, hemoglobin 1.4, platelets 201,000.  Lactic acid 1.4.  CT of the right femur showed skin thickening of the medial and lateral thigh, more confluent in the medial aspect.  There is no soft tissue air  or fluid collection.  CT of the abdomen and pelvis showed skin thickening and edema in the subcutaneous tissues in the left lower quadrant without any soft tissue gas.  There was a 12 mm left iliac lymph node.  There was no acute intra-abdominal findings.  The patient was started on cefazolin and clindamycin.  Assessment/Plan: Cellulitis right lower extremity -Venous duplex -"failed" cephalexin -I feel that much of her pain and discomfort is not directly from uncontrolled infection but rather redistribution of her lymphedema due to her body habitus -Check CRP -Check ESR -She is afebrile hemodynamically stable without leukocytosis  Hypokalemia -Patient given IV potassium on 06/19/2021 -Check BMP  ESRD -Patient has been compliant with dialysis staying all 4 hours -Last dialyzed 06/19/2021 -Continue PhosLo and Tums -Continue to follow the renal  Essential hypertension -Patient states that she has no longer taking any antihypertensive medications  Diabetes mellitus type 2 -Patient states that she has no longer taking any agents at home -She states that she was previously on insulin  Morbid Obesity -BMI 49.53 -lifestyle modification        Status is: Observation  The patient will require care spanning > 2 midnights and should be moved to inpatient because: IV treatments appropriate due to intensity of illness or inability to take PO  Dispo: The patient is from: Home              Anticipated d/c is  to: Home              Patient currently is not medically stable to d/c.   Difficult to place patient No        Family Communication:   no Family at bedside  Consultants:  none  Code Status:  FULL  DVT Prophylaxis:  [place] Heparin    Procedures: As Listed in Progress Note Above  Antibiotics: None       Subjective: Pt complains of pain in right thigh, about the same.  Patient denies fevers, chills, headache, chest pain, dyspnea, nausea, vomiting, diarrhea,  abdominal pain, dysuria, hematuria, hematochezia, and melena.   Objective: Vitals:   06/19/21 1730 06/19/21 1800 06/19/21 2102 06/20/21 0326  BP: 112/69 122/66 (!) 145/80 (!) 100/55  Pulse: 71 76 72 87  Resp: _0 Temp:   97.8 F (36.6 C) 98.1 F (36.7 C)  TempSrc:   Oral   SpO2: 96% 91% 100% 92%  Weight:      Height:        Intake/Output Summary (Last 24 hours) at 06/20/2021 0852 Last data filed at 06/20/2021 0500 Gross per 24 hour  Intake 492.34 ml  Output --  Net 492.34 ml   Weight change:  Exam:  General:  Pt is alert, follows commands appropriately, not in acute distress HEENT: No icterus, No thrush, No neck mass, [place]/AT Cardiovascular: RRR, S1/S2, no rubs, no gallops Respiratory: CTA bilaterally, no wheezing, no crackles, no rhonchi Abdomen: Soft/+BS, non tender, non distended, no guarding Extremities: 2+edema, No lymphangitis, No petechiae, No rashes, no synovitis;  scattered areas of induration without erythema or drainage or crepitance or necrosis   Data Reviewed: I have personally reviewed following labs and imaging studies Basic Metabolic Panel: Recent Labs  Lab 06/19/21 1225 06/20/21 0610  NA 135 135  K 2.5* 3.9  CL 95* 93*  CO2 29 25  GLUCOSE 96 117*  BUN 19 25*  CREATININE 4.53* 5.72*  CALCIUM 8.3* 7.4*   Liver Function Tests: Recent Labs  Lab 06/19/21 1815  AST 15  ALT 14  ALKPHOS 327*  BILITOT 0.5  PROT 6.6  ALBUMIN 3.3*   No results for input(s): LIPASE, AMYLASE in the last 168 hours. No results for input(s): AMMONIA in the last 168 hours. Coagulation Profile: Recent Labs  Lab 06/19/21 1815  INR 1.1   CBC: Recent Labs  Lab 06/19/21 1225 06/20/21 0610  WBC 7.0 8.9  NEUTROABS 4.9  --   HGB 11.4* 12.4  HCT 36.7 39.7  MCV 98.4 102.6*  PLT 201 176   Cardiac Enzymes: No results for input(s): CKTOTAL, CKMB, CKMBINDEX, TROPONINI in the last 168 hours. BNP: Invalid input(s): POCBNP CBG: Recent Labs  Lab 06/19/21 2109  06/20/21 0733  GLUCAP 92 110*   HbA1C: No results for input(s): HGBA1C in the last 72 hours. Urine analysis:    Component Value Date/Time   COLORURINE YELLOW 08/11/2016 1800   APPEARANCEUR HAZY (A) 08/11/2016 1800   LABSPEC 1.020 08/11/2016 1800   PHURINE 5.5 08/11/2016 1800   GLUCOSEU NEGATIVE 08/11/2016 1800   HGBUR MODERATE (A) 08/11/2016 1800   BILIRUBINUR NEGATIVE 08/11/2016 1800   KETONESUR NEGATIVE 08/11/2016 1800   PROTEINUR 100 (A) 08/11/2016 1800   UROBILINOGEN 0.2 12/02/2014 1925   NITRITE NEGATIVE 08/11/2016 1800   LEUKOCYTESUR NEGATIVE 08/11/2016 1800   Sepsis Labs: _1 (procalcitonin:4,lacticidven:4) ) Recent Results (from the past 240 hour(s))  Culture, blood (routine x 2)     Status: None (Preliminary  result)   Collection Time: 06/19/21  2:38 PM   Specimen: BLOOD  Result Value Ref Range Status   Specimen Description BLOOD RIGHT ARM  Final   Special Requests   Final    BOTTLES DRAWN AEROBIC AND ANAEROBIC Blood Culture adequate volume   Culture   Final    NO GROWTH < 24 HOURS Performed at [facility], [address] [place], [place] [address]    Report Status PENDING  Incomplete  Resp Panel by RT-PCR (Flu A&B, Covid) Nasopharyngeal Swab     Status: None   Collection Time: 06/19/21  6:22 PM   Specimen: Nasopharyngeal Swab; Nasopharyngeal(NP) swabs in vial transport medium  Result Value Ref Range Status   SARS Coronavirus 2 by RT PCR NEGATIVE NEGATIVE Final    Comment: (NOTE) SARS-CoV-2 target nucleic acids are NOT DETECTED.  The SARS-CoV-2 RNA is generally detectable in upper respiratory specimens during the acute phase of infection. The lowest concentration of SARS-CoV-2 viral copies this assay can detect is 138 copies/mL. A negative result does not preclude SARS-Cov-2 infection and should not be used as the sole basis for treatment or other patient management decisions. A negative result may occur with  improper specimen collection/handling,  submission of specimen other than nasopharyngeal swab, presence of viral mutation(s) within the areas targeted by this assay, and inadequate number of viral copies(<138 copies/mL). A negative result must be combined with clinical observations, patient history, and epidemiological information. The expected result is Negative.  Fact Sheet for Patients:  EntrepreneurPulse.com.au  Fact Sheet for Healthcare Providers:  IncredibleEmployment.be  This test is no t yet approved or cleared by the [place] FDA and  has been authorized for detection and/or diagnosis of SARS-CoV-2 by FDA under an Emergency Use Authorization (EUA). This EUA will remain  in effect (meaning this test can be used) for the duration of the COVID-19 declaration under Section 564(b)(1) of the Act, 21 U.S.C.section 360bbb-3(b)(1), unless the authorization is terminated  or revoked sooner.       Influenza A by PCR NEGATIVE NEGATIVE Final   Influenza B by PCR NEGATIVE NEGATIVE Final    Comment: (NOTE) The Xpert Xpress SARS-CoV-2/FLU/RSV plus assay is intended as an aid in the diagnosis of influenza from Nasopharyngeal swab specimens and should not be used as a sole basis for treatment. Nasal washings and aspirates are unacceptable for Xpert Xpress SARS-CoV-2/FLU/RSV testing.  Fact Sheet for Patients: EntrepreneurPulse.com.au  Fact Sheet for Healthcare Providers: IncredibleEmployment.be  This test is not yet approved or cleared by the [place] FDA and has been authorized for detection and/or diagnosis of SARS-CoV-2 by FDA under an Emergency Use Authorization (EUA). This EUA will remain in effect (meaning this test can be used) for the duration of the COVID-19 declaration under Section 564(b)(1) of the Act, 21 U.S.C. section 360bbb-3(b)(1), unless the authorization is terminated or revoked.  Performed at [facility], [address], [place], [place] [address]   MRSA Next Gen by PCR, Nasal     Status: Abnormal   Collection Time: 06/19/21  7:24 PM   Specimen: Nasal Mucosa; Nasal Swab  Result Value Ref Range Status   MRSA by PCR Next Gen DETECTED (A) NOT DETECTED Final    Comment: RESULT CALLED TO, READ BACK BY AND VERIFIED WITH: BROWN,B @ 2230 ON 06/19/21 BY JUW (NOTE) The GeneXpert MRSA Assay (FDA approved for NASAL specimens only), is one component of a comprehensive MRSA colonization surveillance program. It is not intended to diagnose MRSA infection  nor to guide or monitor treatment for MRSA infections. Test performance is not FDA approved in patients less than 13 years old. Performed at [facility], [address], [place], [place] [address]      Scheduled Meds:  calcium acetate  1,334 mg Oral TID with meals   calcium carbonate  2 tablet Oral BID   Chlorhexidine Gluconate Cloth  6 each Topical Q0600   heparin  5,000 Units Subcutaneous Q8H   insulin aspart  0-20 Units Subcutaneous TID WC   insulin aspart  0-5 Units Subcutaneous QHS   lactulose  30 g Oral BID   lanthanum  1,500 mg Oral TID with meals   multivitamin  1 tablet Oral Daily   mupirocin ointment  1 application Nasal BID   Continuous Infusions:   ceFAZolin (ANCEF) IV 2 g (06/20/21 0107)   clindamycin (CLEOCIN) IV 600 mg (06/20/21 0335)    Procedures/Studies: CT ABDOMEN PELVIS WO CONTRAST  Result Date: 06/19/2021 CLINICAL DATA:  Abdominal and pelvic cellulitis.  Dialysis patient. EXAM: CT ABDOMEN AND PELVIS WITHOUT CONTRAST TECHNIQUE: Multidetector CT imaging of the abdomen and pelvis was performed following the standard protocol without IV contrast. COMPARISON:  04/20/2019 FINDINGS: Lower chest: No acute findings. Hepatobiliary: No mass visualized on this unenhanced exam. Prior cholecystectomy. No evidence of biliary obstruction. Pancreas: No mass or inflammatory process visualized on this unenhanced exam. Spleen:  Within normal limits in size.  Adrenals/Urinary tract: A few tiny 1-2 mm calculi are noted in the upper poles of both kidneys. No evidence of ureteral calculi or hydronephrosis. Empty urinary bladder. Stomach/Bowel: No evidence of obstruction, inflammatory process, or abnormal fluid collections. Diffuse colonic diverticulosis noted, however there is no evidence of diverticulitis. Vascular/Lymphatic: Stable 12 mm left common iliac lymph node. No other pathologically enlarged lymph nodes identified. Aortic atherosclerotic calcification noted. No evidence of abdominal aortic aneurysm. Reproductive: Prior hysterectomy noted. A cystic lesion is seen in the right adnexa which measures 3.2 x 2.6 cm. This is decreased in size compared to 5.6 x 3.5 cm previously, consistent with benign etiology. No evidence of ascites. Other: Skin thickening and edema of subcutaneous tissues is seen in the left lower quadrant anterior and lateral abdominal wall, consistent with cellulitis. There is no evidence of soft tissue gas or abscess. Musculoskeletal: Multiple Schmorl's nodes are noted in the lumbosacral vertebral bodies. No suspicious bone lesions identified. IMPRESSION: Findings compatible with cellulitis in left anterior lateral abdominal wall soft tissues. No evidence of soft tissue gas or abscess. Tiny bilateral renal calculi. No evidence of ureteral calculi or hydronephrosis. Colonic diverticulosis. No radiographic evidence of diverticulitis. Decreased size of right adnexal cystic lesion, consistent with benign etiology. Aortic Atherosclerosis (ICD10-I70.0). Electronically Signed   By: Marlaine Hind M.D.   On: 06/19/2021 16:23   CT FEMUR RIGHT WO CONTRAST  Result Date: 06/19/2021 CLINICAL DATA:  Cellulitis with worsening edema. Right leg swelling. EXAM: CT OF THE LOWER RIGHT EXTREMITY WITHOUT CONTRAST TECHNIQUE: Multidetector CT imaging of the right lower extremity was performed according to the standard protocol. COMPARISON:  None. FINDINGS:  Bones/Joint/Cartilage No fracture, erosion, periosteal reaction or focal bone abnormality. Moderate knee osteoarthritis. Lateral patellar tilt. Moderate hip osteoarthritis. Small knee joint effusion. No visualized hip joint effusion. Ligaments Suboptimally assessed by CT. Muscles and Tendons Mild generalized fatty atrophy.  No intramuscular collection. Soft tissues Skin thickening about the medial and lateral aspect of the thigh. Medial skin thickening appears symmetric to the included left lower extremity were visualized. There is subcutaneous edema that  is confluent medially. Multifocal soft tissue calcifications, both punctate, amorphous, slightly popcorn in appearance. Greatest degree of dystrophic calcifications are seen posterior medially. No soft tissue air. No discrete focal fluid collection. There are moderate vascular calcifications. No drainable fluid collection. IMPRESSION: 1. Skin thickening about the medial and lateral aspect of the thigh with subcutaneous edema that is confluent medially. Findings may be secondary to cellulitis or lymphedema. No soft tissue air or discrete focal fluid collection. 2. Multifocal soft tissue calcifications, both punctate, amorphous, slightly popcorn in appearance. These are likely dystrophic, likely sequela of chronic renal failure. 3. No acute osseous abnormality. Moderate knee and hip osteoarthritis. Small knee joint effusion. 4. Moderate vascular calcifications. Electronically Signed   By: Keith Rake M.D.   On: 06/19/2021 19:44   CT TIBIA FIBULA RIGHT WO CONTRAST  Result Date: 06/19/2021 CLINICAL DATA:  Cellulitis with worsening edema. EXAM: CT OF THE LOWER RIGHT EXTREMITY WITHOUT CONTRAST TECHNIQUE: Multidetector CT imaging of the right lower extremity was performed according to the standard protocol. COMPARISON:  None. FINDINGS: Bones/Joint/Cartilage No fracture, erosion, or focal bone abnormality. No periosteal reaction. The bones appear under mineralized.  Moderate knee osteoarthritis. Small knee joint effusion. Ligaments Suboptimally assessed by CT. Muscles and Tendons Mild fatty atrophy.  No intramuscular collection. Soft tissues Skin thickening about the medial and lateral aspect of the calf. Soft tissue edema about the ankle and distal aspect of the lower leg anteriorly. There are scattered soft tissue calcifications, most prominently medially, primarily punctate and amorphous. No focal fluid collection. There is no soft tissue air. Moderate vascular calcifications are seen. IMPRESSION: 1. Skin thickening about the medial and lateral aspect of the calf. Soft tissue edema about the ankle and distal aspect of the lower leg anteriorly. No focal fluid collection or soft tissue air. 2. Scattered soft tissue calcifications. These are likely dystrophic and related to chronic renal disease. 3. No acute osseous abnormality. 4. Moderate peripheral vascular calcifications. Electronically Signed   By: Keith Rake M.D.   On: 06/19/2021 19:47   CT FOOT RIGHT WO CONTRAST  Result Date: 06/19/2021 CLINICAL DATA:  Cellulitis with worsening edema. EXAM: CT OF THE RIGHT FOOT WITHOUT CONTRAST TECHNIQUE: Multidetector CT imaging of the right foot was performed according to the standard protocol. Multiplanar CT image reconstructions were also generated. COMPARISON:  None. FINDINGS: Bones/Joint/Cartilage No fracture, erosion, or periosteal reaction. Mild osteoarthritis. No bone destruction. There is no ankle joint effusion. Plantar calcaneal spur. Ligaments Suboptimally assessed by CT. Muscles and Tendons Fatty atrophy of the intrinsic musculature of the foot. Enthesopathic changes of the Achilles tendon insertion. Soft tissues Mild generalized subcutaneous edema. No focal fluid collection. There are scattered punctate calcifications in the soft tissues. Arterial vascular calcifications are seen. IMPRESSION: 1. Mild generalized subcutaneous edema. No focal fluid collection. 2. No  acute osseous abnormality. Electronically Signed   By: Keith Rake M.D.   On: 06/19/2021 19:49    Orson Eva, DO  [facility]  If 7PM-7AM, please contact night-coverage [email or web address] Password [email or web address] 06/20/2021, 8:52 AM   LOS: 0 days

## 2021-06-21 ENCOUNTER — Emergency Department (HOSPITAL_COMMUNITY): Payer: 59

## 2021-06-21 ENCOUNTER — Inpatient Hospital Stay (HOSPITAL_COMMUNITY)
Admission: EM | Admit: 2021-06-21 | Discharge: 2021-08-24 | Disposition: A | Discharge status: Home Health | Payer: 59 | Source: Home / Self Care | Attending: Internal Medicine | Admitting: Internal Medicine

## 2021-06-21 ENCOUNTER — Other Ambulatory Visit: Payer: Self-pay

## 2021-06-21 ENCOUNTER — Encounter (HOSPITAL_COMMUNITY): Payer: Self-pay | Admitting: Emergency Medicine

## 2021-06-21 DIAGNOSIS — Z90710 Acquired absence of both cervix and uterus: Secondary | ICD-10-CM

## 2021-06-21 DIAGNOSIS — A419 Sepsis, unspecified organism: Secondary | ICD-10-CM | POA: Diagnosis present

## 2021-06-21 DIAGNOSIS — T82838A Hemorrhage of vascular prosthetic devices, implants and grafts, initial encounter: Secondary | ICD-10-CM | POA: Diagnosis not present

## 2021-06-21 DIAGNOSIS — I071 Rheumatic tricuspid insufficiency: Secondary | ICD-10-CM | POA: Diagnosis present

## 2021-06-21 DIAGNOSIS — E1122 Type 2 diabetes mellitus with diabetic chronic kidney disease: Secondary | ICD-10-CM | POA: Diagnosis not present

## 2021-06-21 DIAGNOSIS — M898X9 Other specified disorders of bone, unspecified site: Secondary | ICD-10-CM | POA: Diagnosis present

## 2021-06-21 DIAGNOSIS — E876 Hypokalemia: Secondary | ICD-10-CM

## 2021-06-21 DIAGNOSIS — E872 Acidosis: Secondary | ICD-10-CM | POA: Diagnosis present

## 2021-06-21 DIAGNOSIS — R579 Shock, unspecified: Secondary | ICD-10-CM

## 2021-06-21 DIAGNOSIS — L03115 Cellulitis of right lower limb: Secondary | ICD-10-CM | POA: Diagnosis present

## 2021-06-21 DIAGNOSIS — I484 Atypical atrial flutter: Secondary | ICD-10-CM | POA: Diagnosis not present

## 2021-06-21 DIAGNOSIS — F419 Anxiety disorder, unspecified: Secondary | ICD-10-CM | POA: Diagnosis present

## 2021-06-21 DIAGNOSIS — I878 Other specified disorders of veins: Secondary | ICD-10-CM | POA: Diagnosis present

## 2021-06-21 DIAGNOSIS — I5033 Acute on chronic diastolic (congestive) heart failure: Secondary | ICD-10-CM | POA: Diagnosis present

## 2021-06-21 DIAGNOSIS — K5900 Constipation, unspecified: Secondary | ICD-10-CM | POA: Diagnosis not present

## 2021-06-21 DIAGNOSIS — K761 Chronic passive congestion of liver: Secondary | ICD-10-CM | POA: Diagnosis present

## 2021-06-21 DIAGNOSIS — M79662 Pain in left lower leg: Secondary | ICD-10-CM | POA: Diagnosis present

## 2021-06-21 DIAGNOSIS — N186 End stage renal disease: Secondary | ICD-10-CM | POA: Diagnosis present

## 2021-06-21 DIAGNOSIS — R6521 Severe sepsis with septic shock: Secondary | ICD-10-CM | POA: Diagnosis not present

## 2021-06-21 DIAGNOSIS — Z800 Family history of malignant neoplasm of digestive organs: Secondary | ICD-10-CM | POA: Diagnosis not present

## 2021-06-21 DIAGNOSIS — I132 Hypertensive heart and chronic kidney disease with heart failure and with stage 5 chronic kidney disease, or end stage renal disease: Principal | ICD-10-CM | POA: Diagnosis present

## 2021-06-21 DIAGNOSIS — I272 Pulmonary hypertension, unspecified: Secondary | ICD-10-CM

## 2021-06-21 DIAGNOSIS — I5032 Chronic diastolic (congestive) heart failure: Secondary | ICD-10-CM | POA: Diagnosis not present

## 2021-06-21 DIAGNOSIS — I9589 Other hypotension: Secondary | ICD-10-CM | POA: Diagnosis present

## 2021-06-21 DIAGNOSIS — R778 Other specified abnormalities of plasma proteins: Secondary | ICD-10-CM

## 2021-06-21 DIAGNOSIS — D631 Anemia in chronic kidney disease: Secondary | ICD-10-CM | POA: Diagnosis present

## 2021-06-21 DIAGNOSIS — I959 Hypotension, unspecified: Secondary | ICD-10-CM | POA: Diagnosis present

## 2021-06-21 DIAGNOSIS — R079 Chest pain, unspecified: Secondary | ICD-10-CM

## 2021-06-21 DIAGNOSIS — I890 Lymphedema, not elsewhere classified: Secondary | ICD-10-CM | POA: Diagnosis present

## 2021-06-21 DIAGNOSIS — E1165 Type 2 diabetes mellitus with hyperglycemia: Secondary | ICD-10-CM | POA: Diagnosis not present

## 2021-06-21 DIAGNOSIS — Z823 Family history of stroke: Secondary | ICD-10-CM

## 2021-06-21 DIAGNOSIS — I953 Hypotension of hemodialysis: Secondary | ICD-10-CM | POA: Diagnosis not present

## 2021-06-21 DIAGNOSIS — R7989 Other specified abnormal findings of blood chemistry: Secondary | ICD-10-CM | POA: Diagnosis present

## 2021-06-21 DIAGNOSIS — Z91041 Radiographic dye allergy status: Secondary | ICD-10-CM

## 2021-06-21 DIAGNOSIS — Y839 Surgical procedure, unspecified as the cause of abnormal reaction of the patient, or of later complication, without mention of misadventure at the time of the procedure: Secondary | ICD-10-CM | POA: Diagnosis not present

## 2021-06-21 DIAGNOSIS — I2781 Cor pulmonale (chronic): Secondary | ICD-10-CM | POA: Diagnosis present

## 2021-06-21 DIAGNOSIS — Z993 Dependence on wheelchair: Secondary | ICD-10-CM | POA: Diagnosis not present

## 2021-06-21 DIAGNOSIS — I2721 Secondary pulmonary arterial hypertension: Secondary | ICD-10-CM | POA: Diagnosis present

## 2021-06-21 DIAGNOSIS — J9601 Acute respiratory failure with hypoxia: Secondary | ICD-10-CM | POA: Diagnosis present

## 2021-06-21 DIAGNOSIS — E875 Hyperkalemia: Secondary | ICD-10-CM | POA: Diagnosis not present

## 2021-06-21 DIAGNOSIS — E871 Hypo-osmolality and hyponatremia: Secondary | ICD-10-CM | POA: Diagnosis present

## 2021-06-21 DIAGNOSIS — B957 Other staphylococcus as the cause of diseases classified elsewhere: Secondary | ICD-10-CM | POA: Diagnosis not present

## 2021-06-21 DIAGNOSIS — L89892 Pressure ulcer of other site, stage 2: Secondary | ICD-10-CM | POA: Diagnosis present

## 2021-06-21 DIAGNOSIS — G8929 Other chronic pain: Secondary | ICD-10-CM | POA: Diagnosis present

## 2021-06-21 DIAGNOSIS — N2581 Secondary hyperparathyroidism of renal origin: Secondary | ICD-10-CM | POA: Diagnosis present

## 2021-06-21 DIAGNOSIS — T80219A Unspecified infection due to central venous catheter, initial encounter: Secondary | ICD-10-CM | POA: Diagnosis not present

## 2021-06-21 DIAGNOSIS — Z7401 Bed confinement status: Secondary | ICD-10-CM

## 2021-06-21 DIAGNOSIS — Z79899 Other long term (current) drug therapy: Secondary | ICD-10-CM

## 2021-06-21 DIAGNOSIS — Z9289 Personal history of other medical treatment: Secondary | ICD-10-CM

## 2021-06-21 DIAGNOSIS — R7881 Bacteremia: Secondary | ICD-10-CM | POA: Diagnosis not present

## 2021-06-21 DIAGNOSIS — Z6841 Body Mass Index (BMI) 40.0 and over, adult: Secondary | ICD-10-CM

## 2021-06-21 DIAGNOSIS — Z85850 Personal history of malignant neoplasm of thyroid: Secondary | ICD-10-CM | POA: Diagnosis not present

## 2021-06-21 DIAGNOSIS — R570 Cardiogenic shock: Secondary | ICD-10-CM | POA: Diagnosis present

## 2021-06-21 DIAGNOSIS — I4891 Unspecified atrial fibrillation: Secondary | ICD-10-CM | POA: Diagnosis not present

## 2021-06-21 DIAGNOSIS — R1314 Dysphagia, pharyngoesophageal phase: Secondary | ICD-10-CM | POA: Diagnosis not present

## 2021-06-21 DIAGNOSIS — I10 Essential (primary) hypertension: Secondary | ICD-10-CM | POA: Diagnosis not present

## 2021-06-21 DIAGNOSIS — Z9049 Acquired absence of other specified parts of digestive tract: Secondary | ICD-10-CM

## 2021-06-21 DIAGNOSIS — E7800 Pure hypercholesterolemia, unspecified: Secondary | ICD-10-CM | POA: Diagnosis present

## 2021-06-21 DIAGNOSIS — K219 Gastro-esophageal reflux disease without esophagitis: Secondary | ICD-10-CM | POA: Diagnosis present

## 2021-06-21 DIAGNOSIS — Z8249 Family history of ischemic heart disease and other diseases of the circulatory system: Secondary | ICD-10-CM

## 2021-06-21 DIAGNOSIS — L0390 Cellulitis, unspecified: Secondary | ICD-10-CM | POA: Diagnosis present

## 2021-06-21 DIAGNOSIS — I471 Supraventricular tachycardia: Secondary | ICD-10-CM | POA: Diagnosis not present

## 2021-06-21 DIAGNOSIS — I2510 Atherosclerotic heart disease of native coronary artery without angina pectoris: Secondary | ICD-10-CM | POA: Diagnosis present

## 2021-06-21 DIAGNOSIS — I5082 Biventricular heart failure: Secondary | ICD-10-CM | POA: Diagnosis present

## 2021-06-21 DIAGNOSIS — E785 Hyperlipidemia, unspecified: Secondary | ICD-10-CM | POA: Diagnosis not present

## 2021-06-21 DIAGNOSIS — Z992 Dependence on renal dialysis: Secondary | ICD-10-CM

## 2021-06-21 DIAGNOSIS — J811 Chronic pulmonary edema: Secondary | ICD-10-CM

## 2021-06-21 DIAGNOSIS — E662 Morbid (severe) obesity with alveolar hypoventilation: Secondary | ICD-10-CM | POA: Diagnosis present

## 2021-06-21 DIAGNOSIS — I2723 Pulmonary hypertension due to lung diseases and hypoxia: Secondary | ICD-10-CM | POA: Diagnosis present

## 2021-06-21 DIAGNOSIS — L8990 Pressure ulcer of unspecified site, unspecified stage: Secondary | ICD-10-CM | POA: Insufficient documentation

## 2021-06-21 DIAGNOSIS — Z20822 Contact with and (suspected) exposure to covid-19: Secondary | ICD-10-CM | POA: Diagnosis present

## 2021-06-21 DIAGNOSIS — Y92239 Unspecified place in hospital as the place of occurrence of the external cause: Secondary | ICD-10-CM | POA: Diagnosis not present

## 2021-06-21 DIAGNOSIS — M797 Fibromyalgia: Secondary | ICD-10-CM | POA: Diagnosis present

## 2021-06-21 DIAGNOSIS — J449 Chronic obstructive pulmonary disease, unspecified: Secondary | ICD-10-CM | POA: Diagnosis not present

## 2021-06-21 DIAGNOSIS — I50812 Chronic right heart failure: Secondary | ICD-10-CM

## 2021-06-21 DIAGNOSIS — M79661 Pain in right lower leg: Secondary | ICD-10-CM | POA: Diagnosis present

## 2021-06-21 DIAGNOSIS — R945 Abnormal results of liver function studies: Secondary | ICD-10-CM

## 2021-06-21 LAB — CBC WITH DIFFERENTIAL/PLATELET
Abs Immature Granulocytes: 0.1 10*3/uL — ABNORMAL HIGH (ref 0.00–0.07)
Basophils Absolute: 0 10*3/uL (ref 0.0–0.1)
Basophils Relative: 0 %
Eosinophils Absolute: 0 10*3/uL (ref 0.0–0.5)
Eosinophils Relative: 0 %
HCT: 37.5 % (ref 36.0–46.0)
Hemoglobin: 11.5 g/dL — ABNORMAL LOW (ref 12.0–15.0)
Immature Granulocytes: 1 %
Lymphocytes Relative: 11 %
Lymphs Abs: 1 10*3/uL (ref 0.7–4.0)
MCH: 31.7 pg (ref 26.0–34.0)
MCHC: 30.7 g/dL (ref 30.0–36.0)
MCV: 103.3 fL — ABNORMAL HIGH (ref 80.0–100.0)
Monocytes Absolute: 0.6 10*3/uL (ref 0.1–1.0)
Monocytes Relative: 7 %
Neutro Abs: 7.1 10*3/uL (ref 1.7–7.7)
Neutrophils Relative %: 81 %
Platelets: 203 10*3/uL (ref 150–400)
RBC: 3.63 MIL/uL — ABNORMAL LOW (ref 3.87–5.11)
RDW: 14.7 % (ref 11.5–15.5)
WBC: 8.8 10*3/uL (ref 4.0–10.5)
nRBC: 1.3 % — ABNORMAL HIGH (ref 0.0–0.2)

## 2021-06-21 LAB — GLUCOSE, CAPILLARY: Glucose-Capillary: 87 mg/dL (ref 70–99)

## 2021-06-21 LAB — LACTIC ACID, PLASMA
Lactic Acid, Venous: 9.5 mmol/L (ref 0.5–1.9)
Lactic Acid, Venous: 7.6 mmol/L (ref 0.5–1.9)

## 2021-06-21 LAB — COMPREHENSIVE METABOLIC PANEL
ALT: 109 U/L — ABNORMAL HIGH (ref 0–44)
AST: 546 U/L — ABNORMAL HIGH (ref 15–41)
Albumin: 3 g/dL — ABNORMAL LOW (ref 3.5–5.0)
Alkaline Phosphatase: 378 U/L — ABNORMAL HIGH (ref 38–126)
Anion gap: 22 — ABNORMAL HIGH (ref 5–15)
BUN: 44 mg/dL — ABNORMAL HIGH (ref 6–20)
CO2: 17 mmol/L — ABNORMAL LOW (ref 22–32)
Calcium: 7.4 mg/dL — ABNORMAL LOW (ref 8.9–10.3)
Chloride: 93 mmol/L — ABNORMAL LOW (ref 98–111)
Creatinine, Ser: 8.45 mg/dL — ABNORMAL HIGH (ref 0.44–1.00)
GFR, Estimated: 5 mL/min — ABNORMAL LOW (ref >60)
Glucose, Bld: 152 mg/dL — ABNORMAL HIGH (ref 70–99)
Potassium: 4.8 mmol/L (ref 3.5–5.1)
Sodium: 132 mmol/L — ABNORMAL LOW (ref 135–145)
Total Bilirubin: 0.8 mg/dL (ref 0.3–1.2)
Total Protein: 6.3 g/dL — ABNORMAL LOW (ref 6.5–8.1)

## 2021-06-21 LAB — RESP PANEL BY RT-PCR (FLU A&B, COVID) ARPGX2
Influenza A by PCR: NEGATIVE
Influenza B by PCR: NEGATIVE
SARS Coronavirus 2 by RT PCR: NEGATIVE

## 2021-06-21 LAB — C-REACTIVE PROTEIN: CRP: 11.7 mg/dL — ABNORMAL HIGH (ref <1.0)

## 2021-06-21 LAB — TROPONIN I (HIGH SENSITIVITY): Troponin I (High Sensitivity): 326 ng/L (ref <18)

## 2021-06-21 MED ORDER — SODIUM CHLORIDE 0.9 % IV BOLUS
500.0000 mL | Freq: Once | INTRAVENOUS | Status: AC
  Administered 2021-06-21: 500 mL via INTRAVENOUS

## 2021-06-21 MED ORDER — CEPHALEXIN 500 MG PO CAPS: 500.0000 mg | ORAL_CAPSULE | Freq: Two times a day (BID) | ORAL | 0 refills | Status: DC

## 2021-06-21 MED ORDER — LACTATED RINGERS IV SOLN: INTRAVENOUS | Status: DC

## 2021-06-21 MED ORDER — LACTATED RINGERS IV BOLUS (SEPSIS)
1000.0000 mL | Freq: Once | INTRAVENOUS | Status: AC
  Administered 2021-06-21: 1000 mL via INTRAVENOUS

## 2021-06-21 MED ORDER — METRONIDAZOLE 500 MG/100ML IV SOLN
500.0000 mg | Freq: Once | INTRAVENOUS | Status: AC
  Administered 2021-06-21: 500 mg via INTRAVENOUS
  Filled 2021-06-21: qty 100

## 2021-06-21 MED ORDER — SODIUM CHLORIDE 0.9 % IV SOLN
2.0000 g | Freq: Once | INTRAVENOUS | Status: AC
  Administered 2021-06-21: 2 g via INTRAVENOUS
  Filled 2021-06-21: qty 2

## 2021-06-21 MED ORDER — NOREPINEPHRINE 4 MG/250ML-% IV SOLN
2.0000 ug/min | INTRAVENOUS | Status: DC
  Administered 2021-06-21 – 2021-06-23 (×2): 2 ug/min via INTRAVENOUS
  Filled 2021-06-21: qty 250
  Filled 2021-06-21: qty 1000

## 2021-06-21 MED ORDER — SODIUM CHLORIDE 0.9 % IV SOLN
100.0000 mg | Freq: Two times a day (BID) | INTRAVENOUS | Status: DC
  Administered 2021-06-21 – 2021-06-22 (×3): 100 mg via INTRAVENOUS
  Filled 2021-06-21 (×7): qty 100

## 2021-06-21 MED ORDER — SODIUM CHLORIDE 0.9 % IV SOLN: 250.0000 mL | INTRAVENOUS | Status: DC

## 2021-06-21 MED ORDER — LACTATED RINGERS IV BOLUS (SEPSIS)
800.0000 mL | Freq: Once | INTRAVENOUS | Status: AC
  Administered 2021-06-21: 800 mL via INTRAVENOUS

## 2021-06-21 MED ORDER — OXYCODONE HCL 10 MG PO TABS: 10.0000 mg | ORAL_TABLET | Freq: Four times a day (QID) | ORAL | 0 refills | Status: DC | PRN

## 2021-06-21 MED ORDER — CLINDAMYCIN HCL 300 MG PO CAPS: 300.0000 mg | ORAL_CAPSULE | Freq: Three times a day (TID) | ORAL | 0 refills | Status: DC

## 2021-06-21 NOTE — ED Notes (Signed)
Date and time results received: 06/21/21 11:23 PM  Test: Lactic Acid Critical Value: 7.6  Name of Provider Notified: Dr. Melina Copa  Orders Received? Or Actions Taken?:

## 2021-06-21 NOTE — Discharge Summary (Signed)
Physician Discharge Summary  [name] JKK:[id number] DOB: [birth year] DOA: 06/19/2021  PCP: Noreene Larsson, NP  Admit date: 06/19/2021 Discharge date: 06/21/2021  Admitted From: Home Disposition:  Home   Recommendations for Outpatient Follow-up:  Follow up with PCP in 1-2 weeks Please obtain BMP/CBC in one week     Discharge Condition: Stable CODE STATUS:FULL Diet recommendation: Heart Healthy / Carb Modified    Brief/Interim Summary: 55 year old female with a history of COPD, lymphedema, coronary disease, diabetes mellitus type 2, hypertension, hyperlipidemia, morbid obesity, ESRD (MWF), OSA presenting with right leg pain and edema that started over 10 days prior to this admission.  Apparently, the patient noted pain and edema to the lateral aspect of her right lower thigh approximately 10 days ago.  She contacted her PCP who started the patient on cephalexin.  She felt that it may have been some what better.  However in the past 3-4 days she has noted increasing edema and pain on the medial aspect of her right thigh extending to the upper lateral aspect.  She denies any recent falling or trauma.  She denies any open or draining wounds.  She states that her lower leg edema has been about the same as usual.  She has not had any fevers, chills, chest pain, shortness of breath, cough, hemoptysis.  She denies any abdominal pain, diarrhea, hematochezia, melena.  She denies any new medications aside from the cephalexin.  She states that she actually has been using a new " wedge pillow" that she has placed underneath her lower legs approximately 5 to 6 days prior to this admission.  She feels that it raises her lower legs below her knees at least 6 to 12 inches.  At baseline, the patient is essentially bedbound/wheelchair bound.  She states that she is able to take 1 or 2 steps with a walker to make a transfer.  She spends at least 50% of her time in bed, sometimes even more during her dialysis  days. In the emergency department, the patient was afebrile hemodynamically stable with oxygen saturation 96% on room air.  WBC 7.0, hemoglobin 1.4, platelets 201,000.  Lactic acid 1.4.  CT of the right femur showed skin thickening of the medial and lateral thigh, more confluent in the medial aspect.  There is no soft tissue air or fluid collection.  CT of the abdomen and pelvis showed skin thickening and edema in the subcutaneous tissues in the left lower quadrant without any soft tissue gas.  There was a 12 mm left iliac lymph node.  There was no acute intra-abdominal findings.  The patient was started on cefazolin and clindamycin.  Discharge Diagnoses:  Cellulitis right lower extremity/Lymphedema -Venous duplex neg -"failed" cephalexin -I feel that much of her pain and discomfort is not directly from uncontrolled infection but rather redistribution of her lymphedema due to her body habitus -Check CRP 11.7 -Check ESR 58 -She is afebrile hemodynamically stable without leukocytosis -d/c home with cephalexin and clindamycin x 5 more days   Hypokalemia -Patient given IV potassium on 06/19/2021 -K =  3.6 at time of dc   ESRD -Patient has been compliant with dialysis staying all 4 hours -Last dialyzed 06/19/2021 -Continue PhosLo and Tums -Continue to follow the renal -pt can go back to her outpatient HD center 06/22/21 for her usual scheduled HD   Essential hypertension -Patient states that she has no longer taking any antihypertensive medications   Diabetes mellitus type 2 -Patient states that she has no  longer taking any agents at home -She states that she was previously on insulin -CBGs controlled during hospitalization   Morbid Obesity -BMI 49.53 -lifestyle modification    Discharge Instructions   Allergies as of 06/21/2021       Reactions   Contrast Media [iodinated Diagnostic Agents] Anaphylaxis, Hives, Swelling, Other (See Comments)   Dye for cardiac cath. Tongue swells    Pneumococcal Vaccines Swelling, Other (See Comments)   Turns skin black, and bodily swelling   Vancomycin Nausea And Vomiting, Other (See Comments)   Infusion "made me feel like I was dying" had to be readmitted to hospital        Medication List     STOP taking these medications    albuterol (2.5 MG/3ML) 0.083% nebulizer solution Commonly known as: PROVENTIL   albuterol 108 (90 Base) MCG/ACT inhaler Commonly known as: VENTOLIN HFA   ALPRAZolam 0.5 MG tablet Commonly known as: XANAX   amLODipine 2.5 MG tablet Commonly known as: NORVASC   diphenhydrAMINE 25 mg capsule Commonly known as: BENADRYL   guaiFENesin-dextromethorphan 100-10 MG/5ML syrup Commonly known as: ROBITUSSIN DM   HECTOROL PO   insulin detemir 100 UNIT/ML injection Commonly known as: LEVEMIR   Melatonin 5 MG Caps   midodrine 10 MG tablet Commonly known as: PROAMATINE   Molnupiravir 200 MG Caps   nystatin powder Commonly known as: MYCOSTATIN/NYSTOP   sertraline 50 MG tablet Commonly known as: ZOLOFT       TAKE these medications    acetaminophen 325 MG tablet Commonly known as: TYLENOL Take 2 tablets (650 mg total) by mouth every 4 (four) hours as needed for mild pain (or Fever >/= 101).   calcium acetate 667 MG capsule Commonly known as: PHOSLO Take 1,334 mg by mouth 3 (three) times daily.   calcium carbonate 500 MG chewable tablet Commonly known as: TUMS - dosed in mg elemental calcium Chew 2 tablets by mouth 2 (two) times daily.   cephALEXin 500 MG capsule Commonly known as: KEFLEX Take 1 capsule (500 mg total) by mouth 2 (two) times daily. Try to take this every 12 hours. On dialysis days, take the first dose after dialysis and the second at bedtime.   clindamycin 300 MG capsule Commonly known as: CLEOCIN Take 1 capsule (300 mg total) by mouth 3 (three) times daily.   EX-LAX PO Take 2 tablets by mouth daily as needed.   ibuprofen 200 MG tablet Commonly known as:  ADVIL Take 400 mg by mouth every 6 (six) hours as needed for moderate pain.   lactulose 10 GM/15ML solution Commonly known as: CHRONULAC Take 45 mLs (30 g total) by mouth 2 (two) times daily. For constipation   lanthanum 500 MG chewable tablet Commonly known as: FOSRENOL Chew 1,000-1,500 mg by mouth 5 (five) times daily. 1500 mg by mouth three times a day with meals and 1000 mg by mouth twice a day with snacks   loperamide 2 MG capsule Commonly known as: IMODIUM Take 2-4 mg by mouth as needed for diarrhea or loose stools.   multivitamin Tabs tablet Take 1 tablet by mouth daily.   Oxycodone HCl 10 MG Tabs Take 1 tablet (10 mg total) by mouth 4 (four) times daily as needed (pain).        Allergies  Allergen Reactions   Contrast Media [Iodinated Diagnostic Agents] Anaphylaxis, Hives, Swelling and Other (See Comments)    Dye for cardiac cath. Tongue swells   Pneumococcal Vaccines Swelling and Other (See Comments)  Turns skin black, and bodily swelling   Vancomycin Nausea And Vomiting and Other (See Comments)    Infusion "made me feel like I was dying" had to be readmitted to hospital    Consultations: none   Procedures/Studies: CT ABDOMEN PELVIS WO CONTRAST  Result Date: 06/19/2021 CLINICAL DATA:  Abdominal and pelvic cellulitis.  Dialysis patient. EXAM: CT ABDOMEN AND PELVIS WITHOUT CONTRAST TECHNIQUE: Multidetector CT imaging of the abdomen and pelvis was performed following the standard protocol without IV contrast. COMPARISON:  04/20/2019 FINDINGS: Lower chest: No acute findings. Hepatobiliary: No mass visualized on this unenhanced exam. Prior cholecystectomy. No evidence of biliary obstruction. Pancreas: No mass or inflammatory process visualized on this unenhanced exam. Spleen:  Within normal limits in size. Adrenals/Urinary tract: A few tiny 1-2 mm calculi are noted in the upper poles of both kidneys. No evidence of ureteral calculi or hydronephrosis. Empty urinary  bladder. Stomach/Bowel: No evidence of obstruction, inflammatory process, or abnormal fluid collections. Diffuse colonic diverticulosis noted, however there is no evidence of diverticulitis. Vascular/Lymphatic: Stable 12 mm left common iliac lymph node. No other pathologically enlarged lymph nodes identified. Aortic atherosclerotic calcification noted. No evidence of abdominal aortic aneurysm. Reproductive: Prior hysterectomy noted. A cystic lesion is seen in the right adnexa which measures 3.2 x 2.6 cm. This is decreased in size compared to 5.6 x 3.5 cm previously, consistent with benign etiology. No evidence of ascites. Other: Skin thickening and edema of subcutaneous tissues is seen in the left lower quadrant anterior and lateral abdominal wall, consistent with cellulitis. There is no evidence of soft tissue gas or abscess. Musculoskeletal: Multiple Schmorl's nodes are noted in the lumbosacral vertebral bodies. No suspicious bone lesions identified. IMPRESSION: Findings compatible with cellulitis in left anterior lateral abdominal wall soft tissues. No evidence of soft tissue gas or abscess. Tiny bilateral renal calculi. No evidence of ureteral calculi or hydronephrosis. Colonic diverticulosis. No radiographic evidence of diverticulitis. Decreased size of right adnexal cystic lesion, consistent with benign etiology. Aortic Atherosclerosis (ICD10-I70.0). Electronically Signed   By: Marlaine Hind M.D.   On: 06/19/2021 16:23   CT FEMUR RIGHT WO CONTRAST  Result Date: 06/19/2021 CLINICAL DATA:  Cellulitis with worsening edema. Right leg swelling. EXAM: CT OF THE LOWER RIGHT EXTREMITY WITHOUT CONTRAST TECHNIQUE: Multidetector CT imaging of the right lower extremity was performed according to the standard protocol. COMPARISON:  None. FINDINGS: Bones/Joint/Cartilage No fracture, erosion, periosteal reaction or focal bone abnormality. Moderate knee osteoarthritis. Lateral patellar tilt. Moderate hip osteoarthritis.  Small knee joint effusion. No visualized hip joint effusion. Ligaments Suboptimally assessed by CT. Muscles and Tendons Mild generalized fatty atrophy.  No intramuscular collection. Soft tissues Skin thickening about the medial and lateral aspect of the thigh. Medial skin thickening appears symmetric to the included left lower extremity were visualized. There is subcutaneous edema that is confluent medially. Multifocal soft tissue calcifications, both punctate, amorphous, slightly popcorn in appearance. Greatest degree of dystrophic calcifications are seen posterior medially. No soft tissue air. No discrete focal fluid collection. There are moderate vascular calcifications. No drainable fluid collection. IMPRESSION: 1. Skin thickening about the medial and lateral aspect of the thigh with subcutaneous edema that is confluent medially. Findings may be secondary to cellulitis or lymphedema. No soft tissue air or discrete focal fluid collection. 2. Multifocal soft tissue calcifications, both punctate, amorphous, slightly popcorn in appearance. These are likely dystrophic, likely sequela of chronic renal failure. 3. No acute osseous abnormality. Moderate knee and hip osteoarthritis. Small knee joint effusion. 4. Moderate  vascular calcifications. Electronically Signed   By: Keith Rake M.D.   On: 06/19/2021 19:44   CT TIBIA FIBULA RIGHT WO CONTRAST  Result Date: 06/19/2021 CLINICAL DATA:  Cellulitis with worsening edema. EXAM: CT OF THE LOWER RIGHT EXTREMITY WITHOUT CONTRAST TECHNIQUE: Multidetector CT imaging of the right lower extremity was performed according to the standard protocol. COMPARISON:  None. FINDINGS: Bones/Joint/Cartilage No fracture, erosion, or focal bone abnormality. No periosteal reaction. The bones appear under mineralized. Moderate knee osteoarthritis. Small knee joint effusion. Ligaments Suboptimally assessed by CT. Muscles and Tendons Mild fatty atrophy.  No intramuscular collection. Soft  tissues Skin thickening about the medial and lateral aspect of the calf. Soft tissue edema about the ankle and distal aspect of the lower leg anteriorly. There are scattered soft tissue calcifications, most prominently medially, primarily punctate and amorphous. No focal fluid collection. There is no soft tissue air. Moderate vascular calcifications are seen. IMPRESSION: 1. Skin thickening about the medial and lateral aspect of the calf. Soft tissue edema about the ankle and distal aspect of the lower leg anteriorly. No focal fluid collection or soft tissue air. 2. Scattered soft tissue calcifications. These are likely dystrophic and related to chronic renal disease. 3. No acute osseous abnormality. 4. Moderate peripheral vascular calcifications. Electronically Signed   By: Keith Rake M.D.   On: 06/19/2021 19:47   CT FOOT RIGHT WO CONTRAST  Result Date: 06/19/2021 CLINICAL DATA:  Cellulitis with worsening edema. EXAM: CT OF THE RIGHT FOOT WITHOUT CONTRAST TECHNIQUE: Multidetector CT imaging of the right foot was performed according to the standard protocol. Multiplanar CT image reconstructions were also generated. COMPARISON:  None. FINDINGS: Bones/Joint/Cartilage No fracture, erosion, or periosteal reaction. Mild osteoarthritis. No bone destruction. There is no ankle joint effusion. Plantar calcaneal spur. Ligaments Suboptimally assessed by CT. Muscles and Tendons Fatty atrophy of the intrinsic musculature of the foot. Enthesopathic changes of the Achilles tendon insertion. Soft tissues Mild generalized subcutaneous edema. No focal fluid collection. There are scattered punctate calcifications in the soft tissues. Arterial vascular calcifications are seen. IMPRESSION: 1. Mild generalized subcutaneous edema. No focal fluid collection. 2. No acute osseous abnormality. Electronically Signed   By: Keith Rake M.D.   On: 06/19/2021 19:49   US Venous Img Lower Bilateral (DVT)  Result Date:  06/20/2021 CLINICAL DATA:  55 year old with leg pain. EXAM: BILATERAL LOWER EXTREMITY VENOUS DOPPLER ULTRASOUND TECHNIQUE: Gray-scale sonography with graded compression, as well as color Doppler and duplex ultrasound were performed to evaluate the lower extremity deep venous systems from the level of the common femoral vein and including the common femoral, femoral, profunda femoral, popliteal and calf veins including the posterior tibial, peroneal and gastrocnemius veins when visible. The superficial great saphenous vein was also interrogated. Spectral Doppler was utilized to evaluate flow at rest and with distal augmentation maneuvers in the common femoral, femoral and popliteal veins. COMPARISON:  01/25/2017 FINDINGS: RIGHT LOWER EXTREMITY Common Femoral Vein: No evidence of thrombus. Normal compressibility, respiratory phasicity and response to augmentation. Saphenofemoral Junction: No evidence of thrombus. Normal compressibility and flow on color Doppler imaging. Profunda Femoral Vein: No evidence of thrombus. Normal compressibility and flow on color Doppler imaging. Femoral Vein: No evidence of thrombus. Normal compressibility, respiratory phasicity and response to augmentation. Popliteal Vein: No evidence of thrombus. Normal compressibility, respiratory phasicity and response to augmentation. Calf Veins: Visualized right deep calf veins are patent without thrombus. Superficial Great Saphenous Vein: No evidence of thrombus. Normal compressibility. Other Findings:  None. LEFT LOWER EXTREMITY  Common Femoral Vein: No evidence of thrombus. Normal compressibility, respiratory phasicity and response to augmentation. Saphenofemoral Junction: No evidence of thrombus. Normal compressibility and flow on color Doppler imaging. Profunda Femoral Vein: No evidence of thrombus. Normal compressibility and flow on color Doppler imaging. Femoral Vein: No evidence of thrombus. Normal compressibility, respiratory phasicity and  response to augmentation. Popliteal Vein: No evidence of thrombus. Normal compressibility, respiratory phasicity and response to augmentation. Calf Veins: Limited evaluation and cannot be evaluated. Superficial Great Saphenous Vein: No evidence of thrombus. Normal compressibility. Other Findings:  None. IMPRESSION: Technically challenging examination but no clear evidence for deep venous thrombosis in either lower extremity. In particular, cannot evaluate for deep vein thrombosis in the left deep calf veins. Electronically Signed   By: Markus Daft M.D.   On: 06/20/2021 10:38        Discharge Exam: Vitals:   06/20/21 2028 06/21/21 0456  BP: (!) 94/58 (!) 84/52  Pulse: 70 82  Resp: 17 18  Temp: 98 F (36.7 C) 98.1 F (36.7 C)  SpO2: 97% 93%   Vitals:   06/20/21 0923 06/20/21 1408 06/20/21 2028 06/21/21 0456  BP: (!) 100/57 95/67 (!) 94/58 (!) 84/52  Pulse: 86 77 70 82  Resp: _0 Temp: 98.2 F (36.8 C) 98.2 F (36.8 C) 98 F (36.7 C) 98.1 F (36.7 C)  TempSrc: Oral Oral    SpO2: 94% 96% 97% 93%  Weight:      Height:        General: Pt is alert, awake, not in acute distress Cardiovascular: RRR, S1/S2 +, no rubs, no gallops Respiratory: CTA bilaterally, no wheezing, no rhonchi Abdominal: Soft, NT, ND, bowel sounds + Extremities: scattered areas of induration/edema without open wounds, erythema, crepitance, necrosis no cyanosis   The results of significant diagnostics from this hospitalization (including imaging, microbiology, ancillary and laboratory) are listed below for reference.    Significant Diagnostic Studies: CT ABDOMEN PELVIS WO CONTRAST  Result Date: 06/19/2021 CLINICAL DATA:  Abdominal and pelvic cellulitis.  Dialysis patient. EXAM: CT ABDOMEN AND PELVIS WITHOUT CONTRAST TECHNIQUE: Multidetector CT imaging of the abdomen and pelvis was performed following the standard protocol without IV contrast. COMPARISON:  04/20/2019 FINDINGS: Lower chest: No acute  findings. Hepatobiliary: No mass visualized on this unenhanced exam. Prior cholecystectomy. No evidence of biliary obstruction. Pancreas: No mass or inflammatory process visualized on this unenhanced exam. Spleen:  Within normal limits in size. Adrenals/Urinary tract: A few tiny 1-2 mm calculi are noted in the upper poles of both kidneys. No evidence of ureteral calculi or hydronephrosis. Empty urinary bladder. Stomach/Bowel: No evidence of obstruction, inflammatory process, or abnormal fluid collections. Diffuse colonic diverticulosis noted, however there is no evidence of diverticulitis. Vascular/Lymphatic: Stable 12 mm left common iliac lymph node. No other pathologically enlarged lymph nodes identified. Aortic atherosclerotic calcification noted. No evidence of abdominal aortic aneurysm. Reproductive: Prior hysterectomy noted. A cystic lesion is seen in the right adnexa which measures 3.2 x 2.6 cm. This is decreased in size compared to 5.6 x 3.5 cm previously, consistent with benign etiology. No evidence of ascites. Other: Skin thickening and edema of subcutaneous tissues is seen in the left lower quadrant anterior and lateral abdominal wall, consistent with cellulitis. There is no evidence of soft tissue gas or abscess. Musculoskeletal: Multiple Schmorl's nodes are noted in the lumbosacral vertebral bodies. No suspicious bone lesions identified. IMPRESSION: Findings compatible with cellulitis in left anterior lateral abdominal wall soft tissues. No evidence of soft  tissue gas or abscess. Tiny bilateral renal calculi. No evidence of ureteral calculi or hydronephrosis. Colonic diverticulosis. No radiographic evidence of diverticulitis. Decreased size of right adnexal cystic lesion, consistent with benign etiology. Aortic Atherosclerosis (ICD10-I70.0). Electronically Signed   By: Marlaine Hind M.D.   On: 06/19/2021 16:23   CT FEMUR RIGHT WO CONTRAST  Result Date: 06/19/2021 CLINICAL DATA:  Cellulitis with  worsening edema. Right leg swelling. EXAM: CT OF THE LOWER RIGHT EXTREMITY WITHOUT CONTRAST TECHNIQUE: Multidetector CT imaging of the right lower extremity was performed according to the standard protocol. COMPARISON:  None. FINDINGS: Bones/Joint/Cartilage No fracture, erosion, periosteal reaction or focal bone abnormality. Moderate knee osteoarthritis. Lateral patellar tilt. Moderate hip osteoarthritis. Small knee joint effusion. No visualized hip joint effusion. Ligaments Suboptimally assessed by CT. Muscles and Tendons Mild generalized fatty atrophy.  No intramuscular collection. Soft tissues Skin thickening about the medial and lateral aspect of the thigh. Medial skin thickening appears symmetric to the included left lower extremity were visualized. There is subcutaneous edema that is confluent medially. Multifocal soft tissue calcifications, both punctate, amorphous, slightly popcorn in appearance. Greatest degree of dystrophic calcifications are seen posterior medially. No soft tissue air. No discrete focal fluid collection. There are moderate vascular calcifications. No drainable fluid collection. IMPRESSION: 1. Skin thickening about the medial and lateral aspect of the thigh with subcutaneous edema that is confluent medially. Findings may be secondary to cellulitis or lymphedema. No soft tissue air or discrete focal fluid collection. 2. Multifocal soft tissue calcifications, both punctate, amorphous, slightly popcorn in appearance. These are likely dystrophic, likely sequela of chronic renal failure. 3. No acute osseous abnormality. Moderate knee and hip osteoarthritis. Small knee joint effusion. 4. Moderate vascular calcifications. Electronically Signed   By: Keith Rake M.D.   On: 06/19/2021 19:44   CT TIBIA FIBULA RIGHT WO CONTRAST  Result Date: 06/19/2021 CLINICAL DATA:  Cellulitis with worsening edema. EXAM: CT OF THE LOWER RIGHT EXTREMITY WITHOUT CONTRAST TECHNIQUE: Multidetector CT imaging of  the right lower extremity was performed according to the standard protocol. COMPARISON:  None. FINDINGS: Bones/Joint/Cartilage No fracture, erosion, or focal bone abnormality. No periosteal reaction. The bones appear under mineralized. Moderate knee osteoarthritis. Small knee joint effusion. Ligaments Suboptimally assessed by CT. Muscles and Tendons Mild fatty atrophy.  No intramuscular collection. Soft tissues Skin thickening about the medial and lateral aspect of the calf. Soft tissue edema about the ankle and distal aspect of the lower leg anteriorly. There are scattered soft tissue calcifications, most prominently medially, primarily punctate and amorphous. No focal fluid collection. There is no soft tissue air. Moderate vascular calcifications are seen. IMPRESSION: 1. Skin thickening about the medial and lateral aspect of the calf. Soft tissue edema about the ankle and distal aspect of the lower leg anteriorly. No focal fluid collection or soft tissue air. 2. Scattered soft tissue calcifications. These are likely dystrophic and related to chronic renal disease. 3. No acute osseous abnormality. 4. Moderate peripheral vascular calcifications. Electronically Signed   By: Keith Rake M.D.   On: 06/19/2021 19:47   CT FOOT RIGHT WO CONTRAST  Result Date: 06/19/2021 CLINICAL DATA:  Cellulitis with worsening edema. EXAM: CT OF THE RIGHT FOOT WITHOUT CONTRAST TECHNIQUE: Multidetector CT imaging of the right foot was performed according to the standard protocol. Multiplanar CT image reconstructions were also generated. COMPARISON:  None. FINDINGS: Bones/Joint/Cartilage No fracture, erosion, or periosteal reaction. Mild osteoarthritis. No bone destruction. There is no ankle joint effusion. Plantar calcaneal spur. Ligaments Suboptimally assessed  by CT. Muscles and Tendons Fatty atrophy of the intrinsic musculature of the foot. Enthesopathic changes of the Achilles tendon insertion. Soft tissues Mild generalized  subcutaneous edema. No focal fluid collection. There are scattered punctate calcifications in the soft tissues. Arterial vascular calcifications are seen. IMPRESSION: 1. Mild generalized subcutaneous edema. No focal fluid collection. 2. No acute osseous abnormality. Electronically Signed   By: Keith Rake M.D.   On: 06/19/2021 19:49   US Venous Img Lower Bilateral (DVT)  Result Date: 06/20/2021 CLINICAL DATA:  55 year old with leg pain. EXAM: BILATERAL LOWER EXTREMITY VENOUS DOPPLER ULTRASOUND TECHNIQUE: Gray-scale sonography with graded compression, as well as color Doppler and duplex ultrasound were performed to evaluate the lower extremity deep venous systems from the level of the common femoral vein and including the common femoral, femoral, profunda femoral, popliteal and calf veins including the posterior tibial, peroneal and gastrocnemius veins when visible. The superficial great saphenous vein was also interrogated. Spectral Doppler was utilized to evaluate flow at rest and with distal augmentation maneuvers in the common femoral, femoral and popliteal veins. COMPARISON:  01/25/2017 FINDINGS: RIGHT LOWER EXTREMITY Common Femoral Vein: No evidence of thrombus. Normal compressibility, respiratory phasicity and response to augmentation. Saphenofemoral Junction: No evidence of thrombus. Normal compressibility and flow on color Doppler imaging. Profunda Femoral Vein: No evidence of thrombus. Normal compressibility and flow on color Doppler imaging. Femoral Vein: No evidence of thrombus. Normal compressibility, respiratory phasicity and response to augmentation. Popliteal Vein: No evidence of thrombus. Normal compressibility, respiratory phasicity and response to augmentation. Calf Veins: Visualized right deep calf veins are patent without thrombus. Superficial Great Saphenous Vein: No evidence of thrombus. Normal compressibility. Other Findings:  None. LEFT LOWER EXTREMITY Common Femoral Vein: No evidence  of thrombus. Normal compressibility, respiratory phasicity and response to augmentation. Saphenofemoral Junction: No evidence of thrombus. Normal compressibility and flow on color Doppler imaging. Profunda Femoral Vein: No evidence of thrombus. Normal compressibility and flow on color Doppler imaging. Femoral Vein: No evidence of thrombus. Normal compressibility, respiratory phasicity and response to augmentation. Popliteal Vein: No evidence of thrombus. Normal compressibility, respiratory phasicity and response to augmentation. Calf Veins: Limited evaluation and cannot be evaluated. Superficial Great Saphenous Vein: No evidence of thrombus. Normal compressibility. Other Findings:  None. IMPRESSION: Technically challenging examination but no clear evidence for deep venous thrombosis in either lower extremity. In particular, cannot evaluate for deep vein thrombosis in the left deep calf veins. Electronically Signed   By: Markus Daft M.D.   On: 06/20/2021 10:38    Microbiology: Recent Results (from the past 240 hour(s))  Culture, blood (routine x 2)     Status: None (Preliminary result)   Collection Time: 06/19/21  2:38 PM   Specimen: BLOOD  Result Value Ref Range Status   Specimen Description BLOOD RIGHT ARM  Final   Special Requests   Final    BOTTLES DRAWN AEROBIC AND ANAEROBIC Blood Culture adequate volume   Culture   Final    NO GROWTH < 24 HOURS Performed at [facility], [address] [place], [place] [address]    Report Status PENDING  Incomplete  Resp Panel by RT-PCR (Flu A&B, Covid) Nasopharyngeal Swab     Status: None   Collection Time: 06/19/21  6:22 PM   Specimen: Nasopharyngeal Swab; Nasopharyngeal(NP) swabs in vial transport medium  Result Value Ref Range Status   SARS Coronavirus 2 by RT PCR NEGATIVE NEGATIVE Final    Comment: (NOTE) SARS-CoV-2 target nucleic acids are NOT DETECTED.  The SARS-CoV-2 RNA is generally detectable in upper respiratory specimens during the acute  phase of infection. The lowest concentration of SARS-CoV-2 viral copies this assay can detect is 138 copies/mL. A negative result does not preclude SARS-Cov-2 infection and should not be used as the sole basis for treatment or other patient management decisions. A negative result may occur with  improper specimen collection/handling, submission of specimen other than nasopharyngeal swab, presence of viral mutation(s) within the areas targeted by this assay, and inadequate number of viral copies(<138 copies/mL). A negative result must be combined with clinical observations, patient history, and epidemiological information. The expected result is Negative.  Fact Sheet for Patients:  EntrepreneurPulse.com.au  Fact Sheet for Healthcare Providers:  IncredibleEmployment.be  This test is no t yet approved or cleared by the [place] FDA and  has been authorized for detection and/or diagnosis of SARS-CoV-2 by FDA under an Emergency Use Authorization (EUA). This EUA will remain  in effect (meaning this test can be used) for the duration of the COVID-19 declaration under Section 564(b)(1) of the Act, 21 U.S.C.section 360bbb-3(b)(1), unless the authorization is terminated  or revoked sooner.       Influenza A by PCR NEGATIVE NEGATIVE Final   Influenza B by PCR NEGATIVE NEGATIVE Final    Comment: (NOTE) The Xpert Xpress SARS-CoV-2/FLU/RSV plus assay is intended as an aid in the diagnosis of influenza from Nasopharyngeal swab specimens and should not be used as a sole basis for treatment. Nasal washings and aspirates are unacceptable for Xpert Xpress SARS-CoV-2/FLU/RSV testing.  Fact Sheet for Patients: EntrepreneurPulse.com.au  Fact Sheet for Healthcare Providers: IncredibleEmployment.be  This test is not yet approved or cleared by the [place] FDA and has been authorized for detection and/or diagnosis of  SARS-CoV-2 by FDA under an Emergency Use Authorization (EUA). This EUA will remain in effect (meaning this test can be used) for the duration of the COVID-19 declaration under Section 564(b)(1) of the Act, 21 U.S.C. section 360bbb-3(b)(1), unless the authorization is terminated or revoked.  Performed at [facility], [address] [place], [place] [address]   MRSA Next Gen by PCR, Nasal     Status: Abnormal   Collection Time: 06/19/21  7:24 PM   Specimen: Nasal Mucosa; Nasal Swab  Result Value Ref Range Status   MRSA by PCR Next Gen DETECTED (A) NOT DETECTED Final    Comment: RESULT CALLED TO, READ BACK BY AND VERIFIED WITH: BROWN,B @ 2230 ON 06/19/21 BY JUW (NOTE) The GeneXpert MRSA Assay (FDA approved for NASAL specimens only), is one component of a comprehensive MRSA colonization surveillance program. It is not intended to diagnose MRSA infection nor to guide or monitor treatment for MRSA infections. Test performance is not FDA approved in patients less than 60 years old. Performed at [facility], [address], [place], [place] [address]      Labs: Basic Metabolic Panel: Recent Labs  Lab 06/19/21 1225 06/20/21 0610 06/20/21 0911  NA 135 135 133*  K 2.5* 3.9 3.6  CL 95* 93* 91*  CO2 _0 GLUCOSE 96 117* 119*  BUN 19 25* 26*  CREATININE 4.53* 5.72* 6.14*  CALCIUM 8.3* 7.4* 6.8*   Liver Function Tests: Recent Labs  Lab 06/19/21 1815  AST 15  ALT 14  ALKPHOS 327*  BILITOT 0.5  PROT 6.6  ALBUMIN 3.3*   No results for input(s): LIPASE, AMYLASE in the last 168 hours. No results for input(s): AMMONIA in the last 168 hours. CBC:  Recent Labs  Lab 06/19/21 1225 06/20/21 0610 06/20/21 0911  WBC 7.0 8.9 7.2  NEUTROABS 4.9  --   --   HGB 11.4* 12.4 11.7*  HCT 36.7 39.7 36.8  MCV 98.4 102.6* 101.4*  PLT 201 176 179   Cardiac Enzymes: No results for input(s): CKTOTAL, CKMB, CKMBINDEX, TROPONINI in the last 168 hours. BNP: Invalid input(s):  POCBNP CBG: Recent Labs  Lab 06/20/21 0733 06/20/21 1157 06/20/21 1617 06/20/21 2025 06/21/21 0740  GLUCAP 110* 100* 112* 99 87    Time coordinating discharge:  36 minutes  Signed:  Orson Eva, DO [facility] Pager: [phone] 06/21/2021, 8:38 AM

## 2021-06-21 NOTE — Progress Notes (Signed)
Nsg Discharge Note  Admit Date:  06/19/2021 Discharge date: 06/21/2021   Earlean Shawl to be D/C'd Home per MD order.  AVS completed.  Copy for chart, and copy for patient signed, and dated. Patient/caregiver able to verbalize understanding.  Discharge Medication: Allergies as of 06/21/2021       Reactions   Contrast Media [iodinated Diagnostic Agents] Anaphylaxis, Hives, Swelling, Other (See Comments)   Dye for cardiac cath. Tongue swells   Pneumococcal Vaccines Swelling, Other (See Comments)   Turns skin black, and bodily swelling   Vancomycin Nausea And Vomiting, Other (See Comments)   Infusion "made me feel like I was dying" had to be readmitted to hospital        Medication List     STOP taking these medications    albuterol (2.5 MG/3ML) 0.083% nebulizer solution Commonly known as: PROVENTIL   albuterol 108 (90 Base) MCG/ACT inhaler Commonly known as: VENTOLIN HFA   ALPRAZolam 0.5 MG tablet Commonly known as: XANAX   amLODipine 2.5 MG tablet Commonly known as: NORVASC   diphenhydrAMINE 25 mg capsule Commonly known as: BENADRYL   guaiFENesin-dextromethorphan 100-10 MG/5ML syrup Commonly known as: ROBITUSSIN DM   HECTOROL PO   insulin detemir 100 UNIT/ML injection Commonly known as: LEVEMIR   Melatonin 5 MG Caps   midodrine 10 MG tablet Commonly known as: PROAMATINE   Molnupiravir 200 MG Caps   nystatin powder Commonly known as: MYCOSTATIN/NYSTOP   sertraline 50 MG tablet Commonly known as: ZOLOFT       TAKE these medications    acetaminophen 325 MG tablet Commonly known as: TYLENOL Take 2 tablets (650 mg total) by mouth every 4 (four) hours as needed for mild pain (or Fever >/= 101).   calcium acetate 667 MG capsule Commonly known as: PHOSLO Take 1,334 mg by mouth 3 (three) times daily.   calcium carbonate 500 MG chewable tablet Commonly known as: TUMS - dosed in mg elemental calcium Chew 2 tablets by mouth 2 (two) times daily.    cephALEXin 500 MG capsule Commonly known as: KEFLEX Take 1 capsule (500 mg total) by mouth 2 (two) times daily. Try to take this every 12 hours. On dialysis days, take the first dose after dialysis and the second at bedtime.   clindamycin 300 MG capsule Commonly known as: CLEOCIN Take 1 capsule (300 mg total) by mouth 3 (three) times daily.   EX-LAX PO Take 2 tablets by mouth daily as needed.   ibuprofen 200 MG tablet Commonly known as: ADVIL Take 400 mg by mouth every 6 (six) hours as needed for moderate pain.   lactulose 10 GM/15ML solution Commonly known as: CHRONULAC Take 45 mLs (30 g total) by mouth 2 (two) times daily. For constipation   lanthanum 500 MG chewable tablet Commonly known as: FOSRENOL Chew 1,000-1,500 mg by mouth 5 (five) times daily. 1500 mg by mouth three times a day with meals and 1000 mg by mouth twice a day with snacks   loperamide 2 MG capsule Commonly known as: IMODIUM Take 2-4 mg by mouth as needed for diarrhea or loose stools.   multivitamin Tabs tablet Take 1 tablet by mouth daily.   Oxycodone HCl 10 MG Tabs Take 1 tablet (10 mg total) by mouth 4 (four) times daily as needed (pain).        Discharge Assessment: Vitals:   06/20/21 2028 06/21/21 0456  BP: (!) 94/58 (!) 84/52  Pulse: 70 82  Resp: 17 18  Temp: 98 F (  36.7 C) 98.1 F (36.7 C)  SpO2: 97% 93%   Skin clean, dry and intact without evidence of skin break down, no evidence of skin tears noted. IV catheter discontinued intact. Site without signs and symptoms of complications - no redness or edema noted at insertion site, patient denies c/o pain - only slight tenderness at site.  Dressing with slight pressure applied.  D/c Instructions-Education: Discharge instructions given to patient/family with verbalized understanding. D/c education completed with patient/family including follow up instructions, medication list, d/c activities limitations if indicated, with other d/c  instructions as indicated by MD - patient able to verbalize understanding, all questions fully answered. Patient instructed to return to ED, call 911, or call MD for any changes in condition.  Patient escorted via [place], and D/C home via private auto.  Clovis Fredrickson, LPN 0/01/3342 56:86 AM

## 2021-06-21 NOTE — Sepsis Progress Note (Signed)
Elink following for Code Sepsis

## 2021-06-21 NOTE — ED Notes (Signed)
Date and time results received: 06/21/21 2340   Test: TROPONIN Critical Value: 326  Name of Provider Notified: Melina Copa, MD

## 2021-06-21 NOTE — Progress Notes (Signed)
Pharmacy Antibiotic Note  [name] is a 55 y.o. female admitted on 06/21/2021 with cellulitis.  Pharmacy has been consulted for Cefepime dosing. WBC WNL. Just discharged earlier today but came back with fatigue and found to be hypotensive requiring pressors. ESRD on HD.   Plan: Cefepime 1g IV q24h Doxycycline per MD Trend WBC, temp, HD schedule F/U infectious work-up  Height: 5' 5" (165.1 cm) Weight: 135 kg (297 lb 9.9 oz) IBW/kg (Calculated) : 57  Temp (24hrs), Avg:98.3 F (36.8 C), Min:98.1 F (36.7 C), Max:98.4 F (36.9 C)  Recent Labs  Lab 06/19/21 1225 06/19/21 1446 06/20/21 0610 06/20/21 0911 06/21/21 2130 06/21/21 2254 06/21/21 2258  WBC 7.0  --  8.9 7.2 8.8  --   --   CREATININE 4.53*  --  5.72* 6.14*  --  8.45*  --   LATICACIDVEN  --  1.4  --   --  9.5*  --  7.6*    Estimated Creatinine Clearance: 10.6 mL/min (A) (by C-G formula based on SCr of 8.45 mg/dL (H)).    Allergies  Allergen Reactions   Contrast Media [Iodinated Diagnostic Agents] Anaphylaxis, Hives, Swelling and Other (See Comments)    Dye for cardiac cath. Tongue swells   Pneumococcal Vaccines Swelling and Other (See Comments)    Turns skin black, and bodily swelling   Vancomycin Nausea And Vomiting and Other (See Comments)    Infusion "made me feel like I was dying" had to be readmitted to hospital    Narda Bonds, PharmD, [place] Pharmacist Phone: [phone]

## 2021-06-21 NOTE — ED Triage Notes (Signed)
Pt discharged from floor earlier today after being admitted for cellulitis. Pt c/o fatigue and hypotension since being discharged.

## 2021-06-21 NOTE — ED Notes (Signed)
Date and time results received: 06/21/21 2206  Test: lactic Critical Value: 9.5  Name of Provider Notified: Melina Copa, MD  Orders Received? Or Actions Taken?: acknowledged

## 2021-06-21 NOTE — ED Provider Notes (Signed)
[facility] EMERGENCY DEPARTMENT Provider Note   CSN: [id number] Arrival date & time: 06/21/21  2114     History Chief Complaint  Patient presents with   Hypotension    [name] is a 55 y.o. female.  She has a history of lymphedema, end-stage renal disease, CHF.  She last received dialysis 2 days ago.  She was in the hospital for the last 2 days for cellulitis of her lower extremities and abdomen.  She was discharged this morning, blood pressure was 84 systolic.  She has been sleeping all day and her blood pressure is low and family called the ambulance to have her be seen again.  She is complaining of feeling tired and weak.  She says she has not eaten in 4 to 5 days.  Not drinking much fluids.  The history is provided by the patient and the EMS personnel.  Weakness Severity:  Moderate Onset quality:  Gradual Duration:  3 days Timing:  Constant Progression:  Unchanged Context: recent infection   Relieved by:  Nothing Worsened by:  Nothing Ineffective treatments:  Rest Associated symptoms: lethargy   Associated symptoms: no abdominal pain, no chest pain, no dysuria, no falls, no fever, no headaches, no nausea, no shortness of breath and no vomiting       Past Medical History:  Diagnosis Date   Allergy    Phreesia 11/04/2020   Anemia, unspecified    Anxiety    Arthritis    Phreesia 11/04/2020   Asthma    as a child   Cancer ([place])    thyroid   Cellulitis    CHF (congestive heart failure) ([place])    Chiari malformation    s/p surgery   Chiari malformation    Chronic kidney disease    Phreesia 11/04/2020   Chronic kidney disease, stage 5 (HCC)    Chronic obstructive pulmonary disease, unspecified ([place])    Chronic pain    Complication of anesthesia 11/28/15   Resp arrest after  conscious  sedation   Compression of brain (HCC)    Constipation    COPD (chronic obstructive pulmonary disease) ([place])    Phreesia 11/04/2020   Coronary artery disease    40-50% mid  LAD 04/29/09, Medical tx. (Dr. Gwenlyn Found)   Diabetes mellitus    Type II- reports being off all medication d/t it being controlled   Diabetes mellitus without complication ([place])    Phreesia 11/04/2020   Essential (primary) hypertension    Fibromyalgia    Fibromyalgia    History of blood transfusion    hemorrage duinrg pregancy   Hx of echocardiogram 10/2011   EF 55-60%   Hypercholesterolemia    Hyperlipidemia, unspecified    Hypertension    Hypertensive chronic kidney disease with stage 1 through stage 4 chronic kidney disease, or unspecified chronic kidney disease    Hypertensive chronic kidney disease with stage 5 chronic kidney disease or end stage renal disease (HCC)    Lymph edema    Lymphedema, not elsewhere classified    Morbid (severe) obesity due to excess calories (HCC)    Obesity hypoventilation syndrome ([place])    On home oxygen therapy    "2L; 24/7" (12/21/2018)   Peritonitis, unspecified ([place])    Pneumonia    in past   Pulmonary hypertension ([place])    Renal disorder    M/W/F [facility] Pt started dialysis in Dec.2016   S/P colonoscopy 05/26/2007   Dr. Laural Golden sigmoid diverticulosis random biopsies benign  S/P endoscopy 05/01/2009   Dr. Penelope Coop pill-induced esophageal ulcerations distal to midesophagus, 2 small ulcers in the antrum of the stomach   Shortness of breath dyspnea    with any exertion or if heart rate  is irregular while on dialysis   Sleep apnea    reports that she no longer needs CPAP due to weight loss   Sleep apnea, unspecified    Type 2 diabetes mellitus with diabetic nephropathy ([place])    Type 2 diabetes mellitus with hyperglycemia [facility])     Patient Active Problem List   Diagnosis Date Noted   Cellulitis, leg 06/20/2021   COVID-19 02/03/2021   Enteritis, enteropathogenic E. coli 07/02/2020   Bacteremia 07/01/2020   Sepsis due to undetermined organism ([place]) 06/30/2020   Anemia, unspecified    Essential (primary) hypertension     Hyperlipidemia, unspecified    Type 2 diabetes mellitus with hyperglycemia (HCC)    Chronic kidney disease, stage 5 ([place])    Chronic obstructive pulmonary disease, unspecified (HCC)    Fibromyalgia    Hypertensive chronic kidney disease with stage 1 through stage 4 chronic kidney disease, or unspecified chronic kidney disease    Hypertensive chronic kidney disease with stage 5 chronic kidney disease or end stage renal disease (HCC)    Lymphedema, not elsewhere classified    Morbid (severe) obesity due to excess calories ([place])    Peritonitis, unspecified ([place])    Type 2 diabetes mellitus with diabetic nephropathy ([place])    Abdominal pain 04/20/2019   Peritonitis ([place]) 12/21/2018   Abdominal pain, LLQ 12/21/2018   Influenza B 12/21/2018   SOB (shortness of breath) 06/27/2018   Hypokalemia 06/27/2018   Chronic renal failure    Septic shock ([place]) 01/24/2017   Shock liver 01/24/2017   Cor pulmonale, chronic (HCC) 01/24/2017   Elevated troponin    Flu-like symptoms 01/18/2017   Fever 01/18/2017   HCAP (healthcare-associated pneumonia) 12/31/2016   Numbness and tingling in right hand 08/13/2016   Sepsis ([place]) 08/11/2016   Coagulase negative Staphylococcus bacteremia 06/11/2016   ESRD (end stage renal disease) ([place]) 06/06/2016   Chronic cholecystitis 12/09/2015   Protein-calorie malnutrition, severe 11/29/2015   Respiratory arrest ([place]) 11/28/2015   Cellulitis 11/13/2015   Recurrent cellulitis of lower leg 11/13/2015   Chronic diastolic CHF (congestive heart failure) ([place]) 11/13/2015   Chronic respiratory failure with hypoxia ([place]) 11/13/2015   ESRD on dialysis ([place]) 11/13/2015   Anemia of chronic disease 11/13/2015   Rotator cuff syndrome of right shoulder 05/30/2013   Pain in joint, shoulder region 05/30/2013   Obesity hypoventilation syndrome ([place]) 09/04/2012   Hyperkalemia 11/11/2011   Leukopenia 11/11/2011   HTN (hypertension) 11/11/2011   HLD (hyperlipidemia) 11/11/2011    Transaminitis 11/11/2011   Diabetes mellitus ([place]) 10/27/2011   Morbid obesity ([place]) 10/27/2011   Lymphedema of lower extremity 10/04/2011    Past Surgical History:  Procedure Laterality Date   .Hemodialysis catheter Right 11/28/2015   A/V FISTULAGRAM N/A 07/26/2017   Procedure: A/V Fistulagram - Left Arm;  Surgeon: Serafina Mitchell, MD;  Location: [facility];  Service: Cardiovascular;  Laterality: N/A;   ABDOMINAL HYSTERECTOMY     total hysterectomy   ADENOIDECTOMY     AV FISTULA PLACEMENT Left 03/15/2016   Procedure: CREATION OF LEFT ARM ARTERIOVENOUS (AV) FISTULA  ;  Surgeon: Rosetta Posner, MD;  Location: [facility];  Service: Vascular;  Laterality: Left;   CAPD INSERTION N/A 08/30/2017   Procedure: LAPAROSCOPIC INSERTION CONTINUOUS AMBULATORY PERITONEAL  DIALYSIS  (CAPD) CATHETER;  Surgeon: Clovis Riley, MD;  Location: [facility];  Service: General;  Laterality: N/A;   CESAREAN SECTION      x 2   CESAREAN SECTION N/A    Phreesia 11/04/2020   CRANIECTOMY SUBOCCIPITAL W/ CERVICAL LAMINECTOMY / CHIARI     FISTULA SUPERFICIALIZATION Left 05/10/2016   Procedure: Left Arm FISTULA SUPERFICIALIZATION;  Surgeon: Rosetta Posner, MD;  Location: [facility];  Service: Vascular;  Laterality: Left;   IR GENERIC HISTORICAL  08/14/2016   IR REMOVAL TUN ACCESS W/ PORT W/O FL MOD SED 08/14/2016 Arne Cleveland, MD [facility]INTER[facility] RAD   IR SINUS/FIST TUBE CHK-NON GI  01/01/2019   IR US GUIDE BX ASP/DRAIN  12/28/2018   MINOR REMOVAL OF PERITONEAL DIALYSIS CATHETER N/A 04/26/2019   Procedure: REMOVAL OF INFECTED PERITONEAL DIALYSIS CATHETER;  Surgeon: Erroll Luna, MD;  Location: [facility];  Service: General;  Laterality: N/A;   PERIPHERAL VASCULAR BALLOON ANGIOPLASTY Left 10/18/2019   Procedure: PERIPHERAL VASCULAR BALLOON ANGIOPLASTY;  Surgeon: Marty Heck, MD;  Location: [facility];  Service: Cardiovascular;  Laterality: Left;  arm fistula   PERIPHERAL VASCULAR CATHETERIZATION Left 11/25/2016   Procedure:  A/V Fistulagram;  Surgeon: Conrad [place], MD;  Location: [facility];  Service: Cardiovascular;  Laterality: Left;  arm   PORTACATH PLACEMENT  07/05/2012   Procedure: INSERTION PORT-A-CATH;  Surgeon: Donato Heinz, MD;  Location: [facility];  Service: General;  Laterality: Left;  subclavian   portacath removal     RIGHT HEART CATH N/A 08/04/2017   Procedure: RIGHT HEART CATH;  Surgeon: Jolaine Artist, MD;  Location: [facility];  Service: Cardiovascular;  Laterality: N/A;   THYROIDECTOMY, PARTIAL     TONSILLECTOMY       OB History     Gravida  4   Para  3   Term  2   Preterm  1   AB  1   Living         SAB  1   IAB      Ectopic      Multiple      Live Births              Family History  Problem Relation Age of Onset   Colon cancer Mother 66   Stroke Mother 69   Coronary artery disease Mother     Social History   Tobacco Use   Smoking status: Never   Smokeless tobacco: Never  Vaping Use   Vaping Use: Never used  Substance Use Topics   Alcohol use: No    Alcohol/week: 0.0 standard drinks   Drug use: No    Home Medications Prior to Admission medications   Medication Sig Start Date End Date Taking? Authorizing Provider  acetaminophen (TYLENOL) 325 MG tablet Take 2 tablets (650 mg total) by mouth every 4 (four) hours as needed for mild pain (or Fever >/= 101). 07/04/20   Emokpae, Courage, MD  calcium acetate (PHOSLO) 667 MG capsule Take 1,334 mg by mouth 3 (three) times daily. 05/27/21   [provider]  calcium carbonate (TUMS - DOSED IN MG ELEMENTAL CALCIUM) 500 MG chewable tablet Chew 2 tablets by mouth 2 (two) times daily.    [provider]  cephALEXin (KEFLEX) 500 MG capsule Take 1 capsule (500 mg total) by mouth 2 (two) times daily. Try to take this every 12 hours. On dialysis days, take the first dose after dialysis and  the second at bedtime. 06/21/21   Orson Eva, MD  clindamycin (CLEOCIN) 300 MG capsule Take 1 capsule  (300 mg total) by mouth 3 (three) times daily. 06/21/21   Orson Eva, MD  ibuprofen (ADVIL) 200 MG tablet Take 400 mg by mouth every 6 (six) hours as needed for moderate pain.    [provider]  lactulose (CHRONULAC) 10 GM/15ML solution Take 45 mLs (30 g total) by mouth 2 (two) times daily. For constipation 04/30/19   Rai, Vernelle Emerald, MD  lanthanum (FOSRENOL) 500 MG chewable tablet Chew 1,000-1,500 mg by mouth 5 (five) times daily. 1500 mg by mouth three times a day with meals and 1000 mg by mouth twice a day with snacks    [provider]  loperamide (IMODIUM) 2 MG capsule Take 2-4 mg by mouth as needed for diarrhea or loose stools.     [provider]  multivitamin (RENA-VIT) TABS tablet Take 1 tablet by mouth daily. 05/30/18   [provider]  Oxycodone HCl 10 MG TABS Take 1 tablet (10 mg total) by mouth 4 (four) times daily as needed (pain). 06/21/21   Orson Eva, MD  Sennosides (EX-LAX PO) Take 2 tablets by mouth daily as needed.    [provider]  insulin lispro (HUMALOG) 100 UNIT/ML injection Inject 60 Units into the skin 2 (two) times daily.    03/08/12  [provider]    Allergies    Contrast media [iodinated diagnostic agents], Pneumococcal vaccines, and Vancomycin  Review of Systems   Review of Systems  Constitutional:  Positive for activity change and fatigue. Negative for fever.  HENT:  Negative for sore throat.   Eyes:  Negative for visual disturbance.  Respiratory:  Negative for shortness of breath.   Cardiovascular:  Negative for chest pain.  Gastrointestinal:  Negative for abdominal pain, nausea and vomiting.  Genitourinary:  Negative for dysuria.  Musculoskeletal:  Positive for gait problem. Negative for falls.  Skin:  Negative for rash.  Neurological:  Positive for weakness. Negative for headaches.   Physical Exam Updated Vital Signs BP (!) 79/54 (BP Location: Right Wrist)   Pulse 98   Temp 98.4 F (36.9 C) (Oral)    Ht 5' 5" (1.651 m)   Wt 135 kg   SpO2 90%   BMI 49.53 kg/m   Physical Exam Vitals and nursing note reviewed.  Constitutional:      General: She is not in acute distress.    Appearance: Normal appearance. She is well-developed. She is obese.  HENT:     Head: Normocephalic and atraumatic.  Eyes:     Conjunctiva/sclera: Conjunctivae normal.  Cardiovascular:     Rate and Rhythm: Normal rate and regular rhythm.     Heart sounds: No murmur heard. Pulmonary:     Effort: Pulmonary effort is normal. No respiratory distress.     Breath sounds: Normal breath sounds.  Abdominal:     Palpations: Abdomen is soft.     Tenderness: There is no abdominal tenderness. There is no guarding or rebound.  Musculoskeletal:        General: Tenderness present.     Cervical back: Neck supple.     Right lower leg: Edema present.     Left lower leg: Edema present.     Comments: He has marked edema of both lower extremities.  There are some vague tenderness on her right thigh.  Skin:    General: Skin is warm and dry.  Neurological:  General: No focal deficit present.     Mental Status: She is alert and oriented to person, place, and time.    ED Results / Procedures / Treatments   Labs (all labs ordered are listed, but only abnormal results are displayed) Labs Reviewed  CBC WITH DIFFERENTIAL/PLATELET - Abnormal; Notable for the following components:      Result Value   RBC 3.63 (*)    Hemoglobin 11.5 (*)    MCV 103.3 (*)    nRBC 1.3 (*)    Abs Immature Granulocytes 0.10 (*)    All other components within normal limits  LACTIC ACID, PLASMA - Abnormal; Notable for the following components:   Lactic Acid, Venous 9.5 (*)    All other components within normal limits  LACTIC ACID, PLASMA - Abnormal; Notable for the following components:   Lactic Acid, Venous 7.6 (*)    All other components within normal limits  CULTURE, BLOOD (ROUTINE X 2)  CULTURE, BLOOD (ROUTINE X 2)  RESP PANEL BY RT-PCR  (FLU A&B, COVID) ARPGX2  COMPREHENSIVE METABOLIC PANEL  TROPONIN I (HIGH SENSITIVITY)  TROPONIN I (HIGH SENSITIVITY)    EKG EKG Interpretation  Date/Time:  Sunday June 21 2021 21:36:35 EDT Ventricular Rate:  108 PR Interval:  198 QRS Duration: 96 QT Interval:  363 QTC Calculation: 487 R Axis:   108 Text Interpretation: Sinus or ectopic atrial tachycardia Borderline prolonged PR interval Inferior infarct, old Repol abnrm, severe global ischemia (LM/MVD) increased rate and t wave inversions from 2 days ago Confirmed by Aletta Edouard [phone]) on 06/21/2021 9:40:33 PM  Radiology US Venous Img Lower Bilateral (DVT)  Result Date: 06/20/2021 CLINICAL DATA:  55 year old with leg pain. EXAM: BILATERAL LOWER EXTREMITY VENOUS DOPPLER ULTRASOUND TECHNIQUE: Gray-scale sonography with graded compression, as well as color Doppler and duplex ultrasound were performed to evaluate the lower extremity deep venous systems from the level of the common femoral vein and including the common femoral, femoral, profunda femoral, popliteal and calf veins including the posterior tibial, peroneal and gastrocnemius veins when visible. The superficial great saphenous vein was also interrogated. Spectral Doppler was utilized to evaluate flow at rest and with distal augmentation maneuvers in the common femoral, femoral and popliteal veins. COMPARISON:  01/25/2017 FINDINGS: RIGHT LOWER EXTREMITY Common Femoral Vein: No evidence of thrombus. Normal compressibility, respiratory phasicity and response to augmentation. Saphenofemoral Junction: No evidence of thrombus. Normal compressibility and flow on color Doppler imaging. Profunda Femoral Vein: No evidence of thrombus. Normal compressibility and flow on color Doppler imaging. Femoral Vein: No evidence of thrombus. Normal compressibility, respiratory phasicity and response to augmentation. Popliteal Vein: No evidence of thrombus. Normal compressibility, respiratory phasicity and  response to augmentation. Calf Veins: Visualized right deep calf veins are patent without thrombus. Superficial Great Saphenous Vein: No evidence of thrombus. Normal compressibility. Other Findings:  None. LEFT LOWER EXTREMITY Common Femoral Vein: No evidence of thrombus. Normal compressibility, respiratory phasicity and response to augmentation. Saphenofemoral Junction: No evidence of thrombus. Normal compressibility and flow on color Doppler imaging. Profunda Femoral Vein: No evidence of thrombus. Normal compressibility and flow on color Doppler imaging. Femoral Vein: No evidence of thrombus. Normal compressibility, respiratory phasicity and response to augmentation. Popliteal Vein: No evidence of thrombus. Normal compressibility, respiratory phasicity and response to augmentation. Calf Veins: Limited evaluation and cannot be evaluated. Superficial Great Saphenous Vein: No evidence of thrombus. Normal compressibility. Other Findings:  None. IMPRESSION: Technically challenging examination but no clear evidence for deep venous thrombosis in either lower extremity. In  particular, cannot evaluate for deep vein thrombosis in the left deep calf veins. Electronically Signed   By: Markus Daft M.D.   On: 06/20/2021 10:38   DG Chest Port 1 View  Result Date: 06/21/2021 CLINICAL DATA:  Weakness, fatigue, and hypotension after recent admission for cellulitis. EXAM: PORTABLE CHEST 1 VIEW COMPARISON:  06/30/2020 FINDINGS: Cardiac enlargement. Prominent left pulmonary outflow tract. No airspace disease or consolidation in the lungs. No pleural effusions. No pneumothorax. Calcification of the aorta. No change since prior study. IMPRESSION: Cardiac enlargement.  No evidence of active pulmonary disease. Electronically Signed   By: Lucienne Capers M.D.   On: 06/21/2021 22:36    Procedures .Critical Care  Date/Time: 06/21/2021 11:30 PM Performed by: Hayden Rasmussen, MD Authorized by: Hayden Rasmussen, MD   Critical care  provider statement:    Critical care time (minutes):  80   Critical care time was exclusive of:  Separately billable procedures and treating other patients   Critical care was necessary to treat or prevent imminent or life-threatening deterioration of the following conditions:  Circulatory failure and sepsis   Critical care was time spent personally by me on the following activities:  Discussions with consultants, evaluation of patient's response to treatment, examination of patient, ordering and performing treatments and interventions, ordering and review of laboratory studies, ordering and review of radiographic studies, pulse oximetry, re-evaluation of patient's condition, obtaining history from patient or surrogate, review of old charts and development of treatment plan with patient or surrogate   I assumed direction of critical care for this patient from another provider in my specialty: no     Medications Ordered in ED Medications  lactated ringers infusion (has no administration in time range)  lactated ringers bolus 1,000 mL (0 mLs Intravenous Stopped 06/21/21 2313)    And  lactated ringers bolus 800 mL (800 mLs Intravenous New Bag/Given 06/21/21 2311)  doxycycline (VIBRAMYCIN) 100 mg in sodium chloride 0.9 % 250 mL IVPB (100 mg Intravenous New Bag/Given 06/21/21 2247)  0.9 %  sodium chloride infusion (has no administration in time range)  norepinephrine (LEVOPHED) 40m in 2567mpremix infusion (4 mcg/min Intravenous Rate/Dose Change 06/21/21 2321)  sodium chloride 0.9 % bolus 500 mL (0 mLs Intravenous Stopped 06/21/21 2246)  ceFEPIme (MAXIPIME) 2 g in sodium chloride 0.9 % 100 mL IVPB (0 g Intravenous Stopped 06/21/21 2245)  metroNIDAZOLE (FLAGYL) IVPB 500 mg (500 mg Intravenous New Bag/Given 06/21/21 2230)    ED Course  I have reviewed the triage vital signs and the nursing notes.  Pertinent labs & imaging results that were available during my care of the patient were reviewed by me and considered  in my medical decision making (see chart for details).  Clinical Course as of 06/22/21 1035  Sun Jun 21, 2021  2208 Initial lactate 9.5 [MB]  2222 Discussed with PCCM Dr IzOrpah Meltern-call.  Recommended Doxy if cannot take vancomycin.  Consider central line if available.  I will be putting in for a bed from their side. [MB]  2346 I took a look with the ultrasound and did not see a good IJ location for the patient on the right side.  Her blood pressure is trending up so we will hold off for now.  Lactate minimally improved.  Troponin elevated.  Chemistry showing newly elevated LFTs.  CT done 2 days ago indicates prior cholecystectomy. [MB]  2348 Patient states she still has her gallbladder.  She said they wanted to take it out a  few years ago but they thought her heart was not strong enough to have it done. [MB]    Clinical Course User Index [MB] Hayden Rasmussen, MD   MDM Rules/Calculators/A&P                         This patient complains of fatigue decreased p.o. intake; this involves an extensive number of treatment Options and is a complaint that carries with it a high risk of complications and Morbidity. The differential includes sepsis, shock, dehydration, cellulitis, pneumonia, COVID  I ordered, reviewed and interpreted labs, which included CBC with normal white count, hemoglobin similar to priors, lactate markedly elevated. I ordered medication IV fluids, IV pressors, IV antibiotics I ordered imaging studies which included chest x-ray and I independently    visualized and interpreted imaging which showed no acute findings Additional history obtained from EMS Previous records obtained and reviewed in epic including recent admission and discharge earlier today I consulted PCCM and discussed lab and imaging findings  Critical Interventions: Emergent work-up and management of patient's sepsis and hypotension with aggressive IV fluids and antibiotics.  IV pressors  After the  interventions stated above, I reevaluated the patient and found patient to be awake and alert.  Blood pressure is trending up.  She will need admission to the hospital for an ICU down at [facility].  Appreciate PCCM's help with getting her bed and management decisions.   Final Clinical Impression(s) / ED Diagnoses Final diagnoses:  Septic shock ([place])  ESRD (end stage renal disease) ([place])  Elevated troponin  Elevated LFTs    Rx / DC Orders ED Discharge Orders     None        Hayden Rasmussen, MD 06/22/21 1041

## 2021-06-22 ENCOUNTER — Inpatient Hospital Stay (HOSPITAL_COMMUNITY): Payer: 59

## 2021-06-22 DIAGNOSIS — I9589 Other hypotension: Secondary | ICD-10-CM

## 2021-06-22 DIAGNOSIS — I132 Hypertensive heart and chronic kidney disease with heart failure and with stage 5 chronic kidney disease, or end stage renal disease: Secondary | ICD-10-CM | POA: Diagnosis not present

## 2021-06-22 DIAGNOSIS — J9601 Acute respiratory failure with hypoxia: Secondary | ICD-10-CM

## 2021-06-22 DIAGNOSIS — R778 Other specified abnormalities of plasma proteins: Secondary | ICD-10-CM | POA: Diagnosis not present

## 2021-06-22 DIAGNOSIS — R779 Abnormality of plasma protein, unspecified: Secondary | ICD-10-CM | POA: Diagnosis not present

## 2021-06-22 DIAGNOSIS — I50810 Right heart failure, unspecified: Secondary | ICD-10-CM

## 2021-06-22 DIAGNOSIS — I50811 Acute right heart failure: Secondary | ICD-10-CM | POA: Diagnosis not present

## 2021-06-22 DIAGNOSIS — Z20822 Contact with and (suspected) exposure to covid-19: Secondary | ICD-10-CM | POA: Diagnosis not present

## 2021-06-22 DIAGNOSIS — R570 Cardiogenic shock: Secondary | ICD-10-CM | POA: Diagnosis not present

## 2021-06-22 DIAGNOSIS — L03115 Cellulitis of right lower limb: Secondary | ICD-10-CM | POA: Diagnosis not present

## 2021-06-22 DIAGNOSIS — R579 Shock, unspecified: Secondary | ICD-10-CM

## 2021-06-22 DIAGNOSIS — N186 End stage renal disease: Secondary | ICD-10-CM | POA: Diagnosis not present

## 2021-06-22 LAB — POCT I-STAT 7, (LYTES, BLD GAS, ICA,H+H)
Acid-base deficit: 3 mmol/L — ABNORMAL HIGH (ref 0.0–2.0)
Bicarbonate: 23.5 mmol/L (ref 20.0–28.0)
Calcium, Ion: 1.03 mmol/L — ABNORMAL LOW (ref 1.15–1.40)
HCT: 34 % — ABNORMAL LOW (ref 36.0–46.0)
Hemoglobin: 11.6 g/dL — ABNORMAL LOW (ref 12.0–15.0)
O2 Saturation: 96 %
Patient temperature: 97.6
Potassium: 4.7 mmol/L (ref 3.5–5.1)
Sodium: 131 mmol/L — ABNORMAL LOW (ref 135–145)
TCO2: 25 mmol/L (ref 22–32)
pCO2 arterial: 47.5 mmHg (ref 32.0–48.0)
pH, Arterial: 7.299 — ABNORMAL LOW (ref 7.350–7.450)
pO2, Arterial: 89 mmHg (ref 83.0–108.0)

## 2021-06-22 LAB — CBC
HCT: 35.5 % — ABNORMAL LOW (ref 36.0–46.0)
Hemoglobin: 11.1 g/dL — ABNORMAL LOW (ref 12.0–15.0)
MCH: 31 pg (ref 26.0–34.0)
MCHC: 31.3 g/dL (ref 30.0–36.0)
MCV: 99.2 fL (ref 80.0–100.0)
Platelets: 177 10*3/uL (ref 150–400)
RBC: 3.58 MIL/uL — ABNORMAL LOW (ref 3.87–5.11)
RDW: 14.4 % (ref 11.5–15.5)
WBC: 11.4 10*3/uL — ABNORMAL HIGH (ref 4.0–10.5)
nRBC: 0.3 % — ABNORMAL HIGH (ref 0.0–0.2)

## 2021-06-22 LAB — ECHOCARDIOGRAM COMPLETE
AR max vel: 2.08 cm2
AV Area VTI: 2.24 cm2
AV Area mean vel: 1.95 cm2
AV Mean grad: 4 mmHg
AV Peak grad: 8.1 mmHg
Ao pk vel: 1.42 m/s
Area-P 1/2: 3.45 cm2
Height: 65 in
MV VTI: 2.29 cm2
S' Lateral: 1.8 cm
Weight: 4589.1 oz

## 2021-06-22 LAB — BASIC METABOLIC PANEL
Anion gap: 13 (ref 5–15)
BUN: 46 mg/dL — ABNORMAL HIGH (ref 6–20)
CO2: 23 mmol/L (ref 22–32)
Calcium: 7.3 mg/dL — ABNORMAL LOW (ref 8.9–10.3)
Chloride: 97 mmol/L — ABNORMAL LOW (ref 98–111)
Creatinine, Ser: 8.39 mg/dL — ABNORMAL HIGH (ref 0.44–1.00)
GFR, Estimated: 5 mL/min — ABNORMAL LOW (ref >60)
Glucose, Bld: 97 mg/dL (ref 70–99)
Potassium: 4.8 mmol/L (ref 3.5–5.1)
Sodium: 133 mmol/L — ABNORMAL LOW (ref 135–145)

## 2021-06-22 LAB — COMPREHENSIVE METABOLIC PANEL
ALT: 97 U/L — ABNORMAL HIGH (ref 0–44)
AST: 1893 U/L — ABNORMAL HIGH (ref 15–41)
Albumin: 2.8 g/dL — ABNORMAL LOW (ref 3.5–5.0)
Alkaline Phosphatase: 368 U/L — ABNORMAL HIGH (ref 38–126)
Anion gap: 15 (ref 5–15)
BUN: 44 mg/dL — ABNORMAL HIGH (ref 6–20)
CO2: 21 mmol/L — ABNORMAL LOW (ref 22–32)
Calcium: 7.6 mg/dL — ABNORMAL LOW (ref 8.9–10.3)
Chloride: 97 mmol/L — ABNORMAL LOW (ref 98–111)
Creatinine, Ser: 8.92 mg/dL — ABNORMAL HIGH (ref 0.44–1.00)
GFR, Estimated: 5 mL/min — ABNORMAL LOW (ref >60)
Glucose, Bld: 142 mg/dL — ABNORMAL HIGH (ref 70–99)
Potassium: 5 mmol/L (ref 3.5–5.1)
Sodium: 133 mmol/L — ABNORMAL LOW (ref 135–145)
Total Bilirubin: 0.6 mg/dL (ref 0.3–1.2)
Total Protein: 5.8 g/dL — ABNORMAL LOW (ref 6.5–8.1)

## 2021-06-22 LAB — CBC WITH DIFFERENTIAL/PLATELET
Abs Immature Granulocytes: 0.1 10*3/uL — ABNORMAL HIGH (ref 0.00–0.07)
Basophils Absolute: 0 10*3/uL (ref 0.0–0.1)
Basophils Relative: 0 %
Eosinophils Absolute: 0 10*3/uL (ref 0.0–0.5)
Eosinophils Relative: 0 %
HCT: 34.9 % — ABNORMAL LOW (ref 36.0–46.0)
Hemoglobin: 11.2 g/dL — ABNORMAL LOW (ref 12.0–15.0)
Immature Granulocytes: 1 %
Lymphocytes Relative: 13 %
Lymphs Abs: 1.3 10*3/uL (ref 0.7–4.0)
MCH: 31.6 pg (ref 26.0–34.0)
MCHC: 32.1 g/dL (ref 30.0–36.0)
MCV: 98.6 fL (ref 80.0–100.0)
Monocytes Absolute: 1.1 10*3/uL — ABNORMAL HIGH (ref 0.1–1.0)
Monocytes Relative: 11 %
Neutro Abs: 7.5 10*3/uL (ref 1.7–7.7)
Neutrophils Relative %: 75 %
Platelets: 181 10*3/uL (ref 150–400)
RBC: 3.54 MIL/uL — ABNORMAL LOW (ref 3.87–5.11)
RDW: 14.4 % (ref 11.5–15.5)
WBC: 10 10*3/uL (ref 4.0–10.5)
nRBC: 1 % — ABNORMAL HIGH (ref 0.0–0.2)

## 2021-06-22 LAB — HEMOGLOBIN A1C
Hgb A1c MFr Bld: 6.9 % — ABNORMAL HIGH (ref 4.8–5.6)
Mean Plasma Glucose: 151.33 mg/dL

## 2021-06-22 LAB — GLUCOSE, CAPILLARY
Glucose-Capillary: 181 mg/dL — ABNORMAL HIGH (ref 70–99)
Glucose-Capillary: 164 mg/dL — ABNORMAL HIGH (ref 70–99)
Glucose-Capillary: 123 mg/dL — ABNORMAL HIGH (ref 70–99)
Glucose-Capillary: 75 mg/dL (ref 70–99)
Glucose-Capillary: 67 mg/dL — ABNORMAL LOW (ref 70–99)

## 2021-06-22 LAB — LACTIC ACID, PLASMA: Lactic Acid, Venous: 2.4 mmol/L (ref 0.5–1.9)

## 2021-06-22 LAB — BRAIN NATRIURETIC PEPTIDE: B Natriuretic Peptide: 590.8 pg/mL — ABNORMAL HIGH (ref 0.0–100.0)

## 2021-06-22 LAB — TROPONIN I (HIGH SENSITIVITY)
Troponin I (High Sensitivity): 2161 ng/L (ref <18)
Troponin I (High Sensitivity): 3500 ng/L (ref <18)
Troponin I (High Sensitivity): 3765 ng/L (ref <18)

## 2021-06-22 LAB — PROCALCITONIN: Procalcitonin: 5.93 ng/mL

## 2021-06-22 LAB — MRSA NEXT GEN BY PCR, NASAL: MRSA by PCR Next Gen: DETECTED — AB

## 2021-06-22 LAB — CK: Total CK: 120 U/L (ref 38–234)

## 2021-06-22 LAB — D-DIMER, QUANTITATIVE: D-Dimer, Quant: 18.87 ug/mL-FEU — ABNORMAL HIGH (ref 0.00–0.50)

## 2021-06-22 LAB — CREATININE, SERUM
Creatinine, Ser: 8.6 mg/dL — ABNORMAL HIGH (ref 0.44–1.00)
GFR, Estimated: 5 mL/min — ABNORMAL LOW (ref >60)

## 2021-06-22 LAB — HEPARIN LEVEL (UNFRACTIONATED): Heparin Unfractionated: 0.15 IU/mL — ABNORMAL LOW (ref 0.30–0.70)

## 2021-06-22 MED ORDER — HEPARIN BOLUS VIA INFUSION
4000.0000 [IU] | Freq: Once | INTRAVENOUS | Status: AC
  Administered 2021-06-22: 4000 [IU] via INTRAVENOUS
  Filled 2021-06-22: qty 4000

## 2021-06-22 MED ORDER — ORAL CARE MOUTH RINSE
15.0000 mL | Freq: Two times a day (BID) | OROMUCOSAL | Status: DC
  Administered 2021-06-22 – 2021-08-24 (×105): 15 mL via OROMUCOSAL

## 2021-06-22 MED ORDER — SODIUM ZIRCONIUM CYCLOSILICATE 10 G PO PACK
10.0000 g | PACK | Freq: Once | ORAL | Status: AC
  Administered 2021-06-22: 10 g via ORAL
  Filled 2021-06-22: qty 1

## 2021-06-22 MED ORDER — SODIUM CHLORIDE 0.9 % IV SOLN: 250.0000 mL | INTRAVENOUS | Status: DC

## 2021-06-22 MED ORDER — HEPARIN BOLUS VIA INFUSION
2500.0000 [IU] | Freq: Once | INTRAVENOUS | Status: AC
  Administered 2021-06-22: 2500 [IU] via INTRAVENOUS
  Filled 2021-06-22: qty 2500

## 2021-06-22 MED ORDER — INSULIN ASPART 100 UNIT/ML IJ SOLN: 0.0000 [IU] | Freq: Every day | INTRAMUSCULAR | Status: DC

## 2021-06-22 MED ORDER — HEPARIN SODIUM (PORCINE) 5000 UNIT/ML IJ SOLN: 5000.0000 [IU] | Freq: Three times a day (TID) | INTRAMUSCULAR | Status: DC

## 2021-06-22 MED ORDER — SODIUM CHLORIDE 0.9 % IV SOLN
1.0000 g | INTRAVENOUS | Status: DC
  Administered 2021-06-22: 1 g via INTRAVENOUS
  Filled 2021-06-22 (×2): qty 1

## 2021-06-22 MED ORDER — POLYETHYLENE GLYCOL 3350 17 G PO PACK: 17.0000 g | PACK | Freq: Every day | ORAL | Status: DC | PRN

## 2021-06-22 MED ORDER — INSULIN ASPART 100 UNIT/ML IJ SOLN
0.0000 [IU] | Freq: Three times a day (TID) | INTRAMUSCULAR | Status: DC
  Administered 2021-06-22: 4 [IU] via SUBCUTANEOUS
  Administered 2021-06-22: 3 [IU] via SUBCUTANEOUS

## 2021-06-22 MED ORDER — ASPIRIN 81 MG PO CHEW
81.0000 mg | CHEWABLE_TABLET | Freq: Every day | ORAL | Status: DC
  Administered 2021-06-22 – 2021-07-06 (×15): 81 mg via ORAL
  Filled 2021-06-22 (×15): qty 1

## 2021-06-22 MED ORDER — PANTOPRAZOLE SODIUM 40 MG IV SOLR
40.0000 mg | Freq: Every day | INTRAVENOUS | Status: DC
  Administered 2021-06-22 – 2021-06-24 (×3): 40 mg via INTRAVENOUS
  Filled 2021-06-22 (×3): qty 40

## 2021-06-22 MED ORDER — STERILE WATER FOR INJECTION IV SOLN
INTRAVENOUS | Status: DC
  Filled 2021-06-22: qty 1000

## 2021-06-22 MED ORDER — LACTULOSE 10 GM/15ML PO SOLN: 30.0000 g | Freq: Every day | ORAL | Status: DC | PRN

## 2021-06-22 MED ORDER — METRONIDAZOLE 500 MG/100ML IV SOLN
500.0000 mg | Freq: Three times a day (TID) | INTRAVENOUS | Status: DC
  Administered 2021-06-22 – 2021-06-23 (×4): 500 mg via INTRAVENOUS
  Filled 2021-06-22 (×4): qty 100

## 2021-06-22 MED ORDER — CALCIUM ACETATE (PHOS BINDER) 667 MG PO CAPS
667.0000 mg | ORAL_CAPSULE | Freq: Three times a day (TID) | ORAL | Status: DC
  Administered 2021-06-22 – 2021-08-06 (×112): 667 mg via ORAL
  Filled 2021-06-22 (×117): qty 1

## 2021-06-22 MED ORDER — DOCUSATE SODIUM 100 MG PO CAPS
100.0000 mg | ORAL_CAPSULE | Freq: Two times a day (BID) | ORAL | Status: DC | PRN
  Administered 2021-07-28: 100 mg via ORAL
  Filled 2021-06-22 (×2): qty 1

## 2021-06-22 MED ORDER — ONDANSETRON HCL 4 MG/2ML IJ SOLN
4.0000 mg | Freq: Four times a day (QID) | INTRAMUSCULAR | Status: DC | PRN
  Administered 2021-06-22 – 2021-08-24 (×33): 4 mg via INTRAVENOUS
  Filled 2021-06-22 (×33): qty 2

## 2021-06-22 MED ORDER — PHENYLEPHRINE HCL-NACL 10-0.9 MG/250ML-% IV SOLN
25.0000 ug/min | INTRAVENOUS | Status: DC
  Administered 2021-06-22: 60 ug/min via INTRAVENOUS
  Administered 2021-06-22: 50 ug/min via INTRAVENOUS
  Administered 2021-06-22: 70 ug/min via INTRAVENOUS
  Administered 2021-06-22: 55 ug/min via INTRAVENOUS
  Administered 2021-06-22: 25 ug/min via INTRAVENOUS
  Administered 2021-06-22: 60 ug/min via INTRAVENOUS
  Administered 2021-06-23: 35 ug/min via INTRAVENOUS
  Administered 2021-06-23: 49 ug/min via INTRAVENOUS
  Administered 2021-06-23: 51 ug/min via INTRAVENOUS
  Filled 2021-06-22 (×3): qty 250
  Filled 2021-06-22: qty 500
  Filled 2021-06-22 (×3): qty 250
  Filled 2021-06-22: qty 500

## 2021-06-22 MED ORDER — SODIUM CHLORIDE 0.9 % IV SOLN: INTRAVENOUS | Status: AC

## 2021-06-22 MED ORDER — HEPARIN (PORCINE) 25000 UT/250ML-% IV SOLN
1900.0000 [IU]/h | INTRAVENOUS | Status: DC
  Administered 2021-06-22: 1200 [IU]/h via INTRAVENOUS
  Administered 2021-06-22: 900 [IU]/h via INTRAVENOUS
  Administered 2021-06-23: 1450 [IU]/h via INTRAVENOUS
  Administered 2021-06-24: 1900 [IU]/h via INTRAVENOUS
  Filled 2021-06-22 (×4): qty 250

## 2021-06-22 MED ORDER — OXYCODONE HCL 5 MG/5ML PO SOLN
5.0000 mg | ORAL | Status: DC | PRN
  Administered 2021-06-22 – 2021-06-24 (×4): 5 mg via ORAL
  Filled 2021-06-22 (×4): qty 5

## 2021-06-22 MED ORDER — PERFLUTREN LIPID MICROSPHERE
1.0000 mL | INTRAVENOUS | Status: AC | PRN
Start: 2021-06-22 — End: 2021-06-22
  Administered 2021-06-22: 3 mL via INTRAVENOUS
  Filled 2021-06-22: qty 10

## 2021-06-22 NOTE — H&P (Addendum)
NAME:  [name], MRN:  [id number], DOB:  May 02, 1966, LOS: 1 ADMISSION DATE:  06/21/2021, CONSULTATION DATE:  06/21/21  REFERRING MD:  Melina Copa, ED, CHIEF COMPLAINT:  lethargy    History of Present Illness:  [name] is a 55 year old woman with hx of obesity, lymphedema, ESRD on HD MWF, CHF, recently hospitalized at [facility] for cellulitis, discharged 7/3, returning back same day with lethargy, hypotension and elevated lactic acid.   She tells me her family was worried about her after d/c because she was sleeping a lot, called to have her brought back to hospital.  No change in pain /tenderness to RLE.   In ED: LR bolus 1.8, doxycicline, cefepime, flagyl given  Pertinent  Medical History  Anemia Thyroid cancer,  CHF Chiari malformation, ESRD on HD MWF COPD?  CAD HLD  HTN DM lymphedema  Meds; acetaminophen, phoslo, keflex 06/21/21, clinda 06/21/21, lactulose prn, imodium prn, mvi,oxycodone 45m QID prn, senna, insulin/lispro BID  Significant Hospital Events: Including procedures, antibiotic start and stop dates in addition to other pertinent events     Interim History / Subjective:    Objective   Blood pressure (!) 86/58, pulse 87, temperature 97.7 F (36.5 C), temperature source Oral, resp. rate 18, height 5' 5" (1.651 m), weight 130.1 kg, SpO2 95 %.        Intake/Output Summary (Last 24 hours) at 06/22/2021 0703 Last data filed at 06/22/2021 0645 Gross per 24 hour  Intake 3499.42 ml  Output --  Net 3499.42 ml   Filed Weights   06/21/21 2123 06/22/21 0100  Weight: 135 kg 130.1 kg    Examination: General: Pleasant, awake and alert, appropriate HENT: ncat Lungs: ctab Cardiovascular: rrr  Abdomen: nt, nd, obese, nbs Extremities: tenderness in RLE upper leg, extending up to buttock on R side, not warm to touch comparatively  Neuro: alert and oriented x 3 GU:   Resolved Hospital Problem list     Assessment & Plan:  Sepsis, shock, cellulitis: no other clear source.  Blood and urine cultures pending.  Cont fluids, gently given ESRD. 1 l at 100cc/hr.   Cont levophed, on 527m now.   Cont current antibiotics Oxycodone for pain control per pt request.   ESRD: will need regularly scheduled dialysis. MWF.  Will need renal in AM.   DM: ISS   Transaminitis: possibly 2/2 shock liver.   RUQ USKoreardered.    Best Practice (right click and "Reselect all SmartList Selections" daily)   Diet/type: Regular consistency (see orders) DVT prophylaxis: prophylactic heparin  GI prophylaxis: PPI Lines: N/A Foley:  N/A Code Status:  full code Last date of multidisciplinary goals of care discussion _0   Labs   CBC: Recent Labs  Lab 06/19/21 1225 06/20/21 0610 06/20/21 0911 06/21/21 2130 06/22/21 0454  WBC 7.0 8.9 7.2 8.8 11.4*  NEUTROABS 4.9  --   --  7.1  --   HGB 11.4* 12.4 11.7* 11.5* 11.1*  HCT 36.7 39.7 36.8 37.5 35.5*  MCV 98.4 102.6* 101.4* 103.3* 99.2  PLT 201 176 179 203 17029  Basic Metabolic Panel: Recent Labs  Lab 06/19/21 1225 06/20/21 0610 06/20/21 0911 06/21/21 2254 06/22/21 0454  NA 135 135 133* 132*  --   K 2.5* 3.9 3.6 4.8  --   CL 95* 93* 91* 93*  --   CO2 _1 17*  --   GLUCOSE 96 117* 119* 152*  --   BUN 19 25* 26* 44*  --  CREATININE 4.53* 5.72* 6.14* 8.45* 8.60*  CALCIUM 8.3* 7.4* 6.8* 7.4*  --    GFR: Estimated Creatinine Clearance: 10.2 mL/min (A) (by C-G formula based on SCr of 8.6 mg/dL (H)). Recent Labs  Lab 06/19/21 1446 06/20/21 0610 06/20/21 0911 06/21/21 2130 06/21/21 2258 06/22/21 0454  WBC  --  8.9 7.2 8.8  --  11.4*  LATICACIDVEN 1.4  --   --  9.5* 7.6*  --     Liver Function Tests: Recent Labs  Lab 06/19/21 1815 06/21/21 2254  AST 15 546*  ALT 14 109*  ALKPHOS 327* 378*  BILITOT 0.5 0.8  PROT 6.6 6.3*  ALBUMIN 3.3* 3.0*   No results for input(s): LIPASE, AMYLASE in the last 168 hours. No results for input(s): AMMONIA in the last 168 hours.  ABG    Component Value Date/Time    PHART 7.290 (L) 01/24/2017 1140   PCO2ART 41.0 01/24/2017 1140   PO2ART 85 01/24/2017 1140   HCO3 29.2 (H) 08/04/2017 0751   TCO2 26 10/18/2019 1014   ACIDBASEDEF 6.3 (H) 01/24/2017 1140   O2SAT 71.0 08/04/2017 0751     Coagulation Profile: Recent Labs  Lab 06/19/21 1815  INR 1.1    Cardiac Enzymes: No results for input(s): CKTOTAL, CKMB, CKMBINDEX, TROPONINI in the last 168 hours.  HbA1C: Hgb A1c MFr Bld  Date/Time Value Ref Range Status  06/22/2021 04:54 AM 6.9 (H) 4.8 - 5.6 % Final    Comment:    (NOTE) Pre diabetes:          5.7%-6.4%  Diabetes:              >6.4%  Glycemic control for   <7.0% adults with diabetes   06/30/2020 03:28 AM 7.2 (H) 4.8 - 5.6 % Final    Comment:    (NOTE) Pre diabetes:          5.7%-6.4%  Diabetes:              >6.4%  Glycemic control for   <7.0% adults with diabetes     CBG: Recent Labs  Lab 06/20/21 1157 06/20/21 1617 06/20/21 2025 06/21/21 0740 06/22/21 0207  GLUCAP 100* 112* 99 87 181*    Review of Systems:   Review of Systems  Constitutional:  Negative for chills and fever.  HENT:  Negative for hearing loss.   Eyes:  Negative for blurred vision.  Respiratory:  Negative for cough.   Cardiovascular:  Negative for chest pain.  Gastrointestinal:  Negative for heartburn.  Genitourinary:  Negative for dysuria.  Musculoskeletal:  Negative for myalgias.  Skin:  Negative for rash.  Neurological:  Negative for dizziness.  Endo/Heme/Allergies:  Does not bruise/bleed easily.  Psychiatric/Behavioral:  Negative for depression.     Past Medical History:  She,  has a past medical history of Allergy, Anemia, unspecified, Anxiety, Arthritis, Asthma, Cancer ([place]), Cellulitis, CHF (congestive heart failure) ([place]), Chiari malformation, Chiari malformation, Chronic kidney disease, Chronic kidney disease, stage 5 ([place]), Chronic obstructive pulmonary disease, unspecified ([place]), Chronic pain, Complication of anesthesia (11/28/15),  Compression of brain ([place]), Constipation, COPD (chronic obstructive pulmonary disease) ([place]), Coronary artery disease, Diabetes mellitus, Diabetes mellitus without complication ([place]), Essential (primary) hypertension, Fibromyalgia, Fibromyalgia, History of blood transfusion, echocardiogram (10/2011), Hypercholesterolemia, Hyperlipidemia, unspecified, Hypertension, Hypertensive chronic kidney disease with stage 1 through stage 4 chronic kidney disease, or unspecified chronic kidney disease, Hypertensive chronic kidney disease with stage 5 chronic kidney disease or end stage renal disease ([place]), Lymph edema, Lymphedema, not elsewhere classified,  Morbid (severe) obesity due to excess calories ([place]), Obesity hypoventilation syndrome ([place]), On home oxygen therapy, Peritonitis, unspecified ([place]), Pneumonia, Pulmonary hypertension ([place]), Renal disorder, S/P colonoscopy (05/26/2007), S/P endoscopy (05/01/2009), Shortness of breath dyspnea, Sleep apnea, Sleep apnea, unspecified, Type 2 diabetes mellitus with diabetic nephropathy ([place]), and Type 2 diabetes mellitus with hyperglycemia ([place]).   Surgical History:   Past Surgical History:  Procedure Laterality Date   .Hemodialysis catheter Right 11/28/2015   A/V FISTULAGRAM N/A 07/26/2017   Procedure: A/V Fistulagram - Left Arm;  Surgeon: Serafina Mitchell, MD;  Location: [facility];  Service: Cardiovascular;  Laterality: N/A;   ABDOMINAL HYSTERECTOMY     total hysterectomy   ADENOIDECTOMY     AV FISTULA PLACEMENT Left 03/15/2016   Procedure: CREATION OF LEFT ARM ARTERIOVENOUS (AV) FISTULA  ;  Surgeon: Rosetta Posner, MD;  Location: [facility];  Service: Vascular;  Laterality: Left;   CAPD INSERTION N/A 08/30/2017   Procedure: LAPAROSCOPIC INSERTION CONTINUOUS AMBULATORY PERITONEAL DIALYSIS  (CAPD) CATHETER;  Surgeon: Clovis Riley, MD;  Location: [facility];  Service: General;  Laterality: N/A;   CESAREAN SECTION      x 2   CESAREAN SECTION N/A    Phreesia 11/04/2020    CRANIECTOMY SUBOCCIPITAL W/ CERVICAL LAMINECTOMY / CHIARI     FISTULA SUPERFICIALIZATION Left 05/10/2016   Procedure: Left Arm FISTULA SUPERFICIALIZATION;  Surgeon: Rosetta Posner, MD;  Location: [facility];  Service: Vascular;  Laterality: Left;   IR GENERIC HISTORICAL  08/14/2016   IR REMOVAL TUN ACCESS W/ PORT W/O FL MOD SED 08/14/2016 Arne Cleveland, MD [facility]   IR SINUS/FIST TUBE CHK-NON GI  01/01/2019   IR US GUIDE BX ASP/DRAIN  12/28/2018   MINOR REMOVAL OF PERITONEAL DIALYSIS CATHETER N/A 04/26/2019   Procedure: REMOVAL OF INFECTED PERITONEAL DIALYSIS CATHETER;  Surgeon: Erroll Luna, MD;  Location: [facility];  Service: General;  Laterality: N/A;   PERIPHERAL VASCULAR BALLOON ANGIOPLASTY Left 10/18/2019   Procedure: PERIPHERAL VASCULAR BALLOON ANGIOPLASTY;  Surgeon: Marty Heck, MD;  Location: [facility];  Service: Cardiovascular;  Laterality: Left;  arm fistula   PERIPHERAL VASCULAR CATHETERIZATION Left 11/25/2016   Procedure: A/V Fistulagram;  Surgeon: Conrad Parker, MD;  Location: [facility];  Service: Cardiovascular;  Laterality: Left;  arm   PORTACATH PLACEMENT  07/05/2012   Procedure: INSERTION PORT-A-CATH;  Surgeon: Donato Heinz, MD;  Location: [facility];  Service: General;  Laterality: Left;  subclavian   portacath removal     RIGHT HEART CATH N/A 08/04/2017   Procedure: RIGHT HEART CATH;  Surgeon: Jolaine Artist, MD;  Location: [facility];  Service: Cardiovascular;  Laterality: N/A;   THYROIDECTOMY, PARTIAL     TONSILLECTOMY       Social History:   reports that she has never smoked. She has never used smokeless tobacco. She reports that she does not drink alcohol and does not use drugs.   Family History:  Her family history includes Colon cancer (age of onset: 30) in her mother; Coronary artery disease in her mother; Stroke (age of onset: 41) in her mother.   Allergies Allergies  Allergen Reactions   Contrast Media [Iodinated Diagnostic  Agents] Anaphylaxis, Hives, Swelling and Other (See Comments)    Dye for cardiac cath. Tongue swells   Pneumococcal Vaccines Swelling and Other (See Comments)    Turns skin black, and bodily swelling   Vancomycin Nausea And Vomiting and Other (See Comments)  Infusion "made me feel like I was dying" had to be readmitted to hospital     Home Medications  Prior to Admission medications   Medication Sig Start Date End Date Taking? Authorizing Provider  acetaminophen (TYLENOL) 325 MG tablet Take 2 tablets (650 mg total) by mouth every 4 (four) hours as needed for mild pain (or Fever >/= 101). 07/04/20  Yes Emokpae, Courage, MD  calcium acetate (PHOSLO) 667 MG capsule Take 1,334 mg by mouth 3 (three) times daily. 05/27/21  Yes [provider]  calcium carbonate (TUMS - DOSED IN MG ELEMENTAL CALCIUM) 500 MG chewable tablet Chew 2 tablets by mouth 2 (two) times daily.   Yes [provider]  cephALEXin (KEFLEX) 500 MG capsule Take 1 capsule (500 mg total) by mouth 2 (two) times daily. Try to take this every 12 hours. On dialysis days, take the first dose after dialysis and the second at bedtime. 06/21/21  Yes Tat, Shanon Brow, MD  ibuprofen (ADVIL) 200 MG tablet Take 400 mg by mouth every 6 (six) hours as needed for moderate pain.   Yes [provider]  lanthanum (FOSRENOL) 500 MG chewable tablet Chew 1,000-1,500 mg by mouth 5 (five) times daily. 1500 mg by mouth three times a day with meals and 1000 mg by mouth twice a day with snacks   Yes [provider]  loperamide (IMODIUM) 2 MG capsule Take 2-4 mg by mouth as needed for diarrhea or loose stools.    Yes [provider]  multivitamin (RENA-VIT) TABS tablet Take 1 tablet by mouth daily. 05/30/18  Yes [provider]  Oxycodone HCl 10 MG TABS Take 1 tablet (10 mg total) by mouth 4 (four) times daily as needed (pain). 06/21/21  Yes Tat, Shanon Brow, MD  Sennosides (EX-LAX PO) Take 2 tablets by mouth daily as needed  (constipation).   Yes [provider]  clindamycin (CLEOCIN) 300 MG capsule Take 1 capsule (300 mg total) by mouth 3 (three) times daily. 06/21/21   Orson Eva, MD  lactulose (CHRONULAC) 10 GM/15ML solution Take 45 mLs (30 g total) by mouth 2 (two) times daily. For constipation Patient not taking: Reported on 06/21/2021 04/30/19   Rai, Vernelle Emerald, MD  insulin lispro (HUMALOG) 100 UNIT/ML injection Inject 60 Units into the skin 2 (two) times daily.    03/08/12  [provider]     Critical care time: 60 minutes

## 2021-06-22 NOTE — Progress Notes (Signed)
ANTICOAGULATION CONSULT NOTE - Initial Consult  Pharmacy Consult for Heparin  Indication: chest pain/ACS, elevated troponin   Allergies  Allergen Reactions   Contrast Media [Iodinated Diagnostic Agents] Anaphylaxis, Hives, Swelling and Other (See Comments)    Dye for cardiac cath. Tongue swells   Pneumococcal Vaccines Swelling and Other (See Comments)    Turns skin black, and bodily swelling   Vancomycin Nausea And Vomiting and Other (See Comments)    Infusion "made me feel like I was dying" had to be readmitted to hospital    Patient Measurements: Height: _0  (165.1 cm) Weight: 130.1 kg (286 lb 13.1 oz) IBW/kg (Calculated) : 57  Vital Signs: Temp: 97.7 F (36.5 C) (07/04 0445) Temp Source: Oral (07/04 0445) BP: 102/63 (07/04 0620) Pulse Rate: 79 (07/04 0620)  Labs: Recent Labs    06/19/21 1815 06/20/21 0610 06/20/21 0911 06/21/21 2130 06/21/21 2254 06/22/21 0454  HGB  --    < > 11.7* 11.5*  --  11.1*  HCT  --    < > 36.8 37.5  --  35.5*  PLT  --    < > 179 203  --  177  APTT 31  --   --   --   --   --   LABPROT 14.6  --   --   --   --   --   INR 1.1  --   --   --   --   --   CREATININE  --    < > 6.14*  --  8.45* 8.60*  TROPONINIHS  --   --   --   --  326*  --    < > = values in this interval not displayed.    Estimated Creatinine Clearance: 10.2 mL/min (A) (by C-G formula based on SCr of 8.6 mg/dL (H)).   Medical History: Past Medical History:  Diagnosis Date   Allergy    Phreesia 11/04/2020   Anemia, unspecified    Anxiety    Arthritis    Phreesia 11/04/2020   Asthma    as a child   Cancer ([place])    thyroid   Cellulitis    CHF (congestive heart failure) ([place])    Chiari malformation    s/p surgery   Chiari malformation    Chronic kidney disease    Phreesia 11/04/2020   Chronic kidney disease, stage 5 (HCC)    Chronic obstructive pulmonary disease, unspecified ([place])    Chronic pain    Complication of anesthesia 11/28/15   Resp arrest after   conscious  sedation   Compression of brain (H[facility])    Constipation    COPD (chronic obstructive pulmonary disease) ([place])    Phreesia 11/04/2020   Coronary artery disease    40-50% mid LAD 04/29/09, Medical tx. (Dr. Gwenlyn Found)   Diabetes mellitus    Type II- reports being off all medication d/t it being controlled   Diabetes mellitus without complication ([place])    Phreesia 11/04/2020   Essential (primary) hypertension    Fibromyalgia    Fibromyalgia    History of blood transfusion    hemorrage duinrg pregancy   Hx of echocardiogram 10/2011   EF 55-60%   Hypercholesterolemia    Hyperlipidemia, unspecified    Hypertension    Hypertensive chronic kidney disease with stage 1 through stage 4 chronic kidney disease, or unspecified chronic kidney disease    Hypertensive chronic kidney disease with stage 5 chronic kidney disease or end stage renal  disease ([place])    Lymph edema    Lymphedema, not elsewhere classified    Morbid (severe) obesity due to excess calories ([place])    Obesity hypoventilation syndrome ([place])    On home oxygen therapy    "2L; 24/7" (12/21/2018)   Peritonitis, unspecified ([place])    Pneumonia    in past   Pulmonary hypertension ([place])    Renal disorder    M/W/F [facility] Pt started dialysis in Dec.2016   S/P colonoscopy 05/26/2007   Dr. Laural Golden sigmoid diverticulosis random biopsies benign   S/P endoscopy 05/01/2009   Dr. Penelope Coop pill-induced esophageal ulcerations distal to midesophagus, 2 small ulcers in the antrum of the stomach   Shortness of breath dyspnea    with any exertion or if heart rate  is irregular while on dialysis   Sleep apnea    reports that she no longer needs CPAP due to weight loss   Sleep apnea, unspecified    Type 2 diabetes mellitus with diabetic nephropathy ([place])    Type 2 diabetes mellitus with hyperglycemia [facility])      Assessment: 55 y/o F transfer from [facility] with r/o sepsis and hypotension requiring vasopressor support. Starting heparin for  elevated troponin. Hgb 11.1. ESRD on HD. PTA meds reviewed.   Goal of Therapy:  Heparin level 0.3-0.7 units/ml Monitor platelets by anticoagulation protocol: Yes   Plan:  Heparin 4000 units BOLUS Start heparin drip at 900 units/hr 1500 Heparin level Daily CBC/Heparin level Monitor for bleeding  Narda Bonds, PharmD, BCPS Clinical Pharmacist Phone: [phone]

## 2021-06-22 NOTE — Progress Notes (Signed)
  Echocardiogram 2D Echocardiogram has been performed.  [name] 06/22/2021, 8:55 AM

## 2021-06-22 NOTE — Progress Notes (Signed)
Lab called w/ critical LA of 2.4, however this is an improvement.  CCM aware

## 2021-06-22 NOTE — Progress Notes (Addendum)
eLink Physician-Brief Progress Note Patient Name: [name] DOB: July 06, 1966 MRN: [id number]   Date of Service  06/22/2021  HPI/Events of Note  Elevated HR - EKG reveals Supraventricular tachycardia with Premature supraventricular complexes and with frequent Premature ventricular complexes. Ventricular rate = 175. Low voltage QRS Possible Inferior infarct , age undetermined ST & T wave abnormality, consider anterolateral ischemia. Now sinus tachycardia with HR 120. Currently on Norepinephrine IV infusion and AM labs pending.  eICU Interventions  Plan: Await AM labs. Phenylephrine IV infusion via PIV. Titrate to MAP >= 65. Wean Norepinephrine IV infusion off as toleratedd.      Intervention Category Major Interventions: Arrhythmia - evaluation and management  Shalamar Crays Cornelia Copa 06/22/2021, 5:39 AM

## 2021-06-22 NOTE — Consult Note (Addendum)
Cardiology Consultation:   Patient ID: [name] MRN: [id number]; DOB: 19-Feb-1966  Admit date: 06/21/2021 Date of Consult: 06/22/2021  PCP:  Lindsay Larsson, NP   [facility] Providers Cardiologist:  None       Remote Dr. Harrington Challenger Patient Profile:   [name] is a 55 y.o. female with a hx of ESRD with HD-MWF, DM, morbid obesity, cellulitis and PAP 34 in 2018, OSA with prior sepsis, lympedema,  COPD no longer uses home 02, who is being seen 06/22/2021 after admit for lethargy, septic shock acute resp. Failure with hypoxia for the evaluation of cor pulmonale at the request of Dr. Carlis Abbott.  History of Present Illness:   [name] with hx of CAD   L heart cath in 2007 and 2010 showed mild to mod segmental mid LAD.  The pt was seen by cardiology in 2018 whne hosp with pneumonia /sepsis. She was  Hyptoensive on pressors  Trop elevation felt due to strain Echo showed LVEF normal  Mod RV dysfunction with septal flattening   PAP 89 CT scan without evid of PE   Pt set up for R heart cath which was done in AUg 2018   RA 11  PV 76/13, PAP 76/24  PCWP 19  PVR 3 WU    PAH likely multifactorial given high cardiac output and relatively normal PVR suspect majority of PAH may be due to presence of AV fistula.  The pt was admitted on 06/19/21 with R Leg pain/edema  Had been sarted on Abx as an outpt Wheelchair/bedbound  Treated with IV ABX for cellulitis  Venous dopplers neg for DVT  Sent home yesterday 7/3      She now represented to ED today (06/22/21) with lethargy.  Shewas hypotensive with an elevated lactic acid.  She was given LR bolus 1.8 L, doxycycline, cefepime and flagyl IV  She was admitted to ICU and placed on vasopressors now from NE to neo synephrine.  She is also on heparin empirically         The pt denies chest pain.  No SOB.  SHe has chronic 5 pillow orthopnea.  She gets around in a wheel chair.  She lives with her handicapped husband and her 68 year old granddaughter who helps both of  them.  Pt is is in charge of her  own ADLs.  Today Ph 7.299, pC02 47.5  pO2 89  Na 131, K+ 4.7 BUN 46 Cr 8.39  Alk phos 368, albumin 2.8   AST 1893, ALT 97  t. Protein 5.8  Ddimer 18.87 Hs troponin 326 yesterday and now 3500  bnp 590 last year 85.  CBC WBC 11.4, Hgb 11.1 HCT 35.5 plts 177 Procalcitionin 5.93   EKG:  The EKG was personally reviewed and demonstrates:  EKG 06/19/21 with last admit SR and no ST changes.  Now on return 06/21/21 tachycardia with a flutter or atrial tach.  Today tachycardia possible P wave back in twave, causing ST changes in EKG- junct tach with HR 130 most recent EKG at 0923 this AM with SR at 61 and Q waves in III, and AVF which ar old.   Telemetry:  Telemetry was personally reviewed and demonstrates:  from 4 am to 6 am today she had SVT (110s)   Abrupt on / off   then SR    Echo today  EF 60-65%, no RWMA, moderate concentric LVH. L9JQ  RV systolic function is moderately to severely reduced.  RV severely enlarged,  severely elevated PA systolic pressure ,, the RV systolic pressure in 93.7  TR moderate to severe and functional in nature.  RA severely dilated.  No AS   RA pressure approx 15 mmHg.   Currently BP 92/66 to 127/103( on neo)   P 70 R 19    [name] denies chest pain, feels better though still lethargic.  Past Medical History:  Diagnosis Date   Allergy    Phreesia 11/04/2020   Anemia, unspecified    Anxiety    Arthritis    Phreesia 11/04/2020   Asthma    as a child   Cancer ([place])    thyroid   Cellulitis    CHF (congestive heart failure) ([place])    Chiari malformation    s/p surgery   Chiari malformation    Chronic kidney disease    Phreesia 11/04/2020   Chronic kidney disease, stage 5 (HCC)    Chronic obstructive pulmonary disease, unspecified ([place])    Chronic pain    Complication of anesthesia 11/28/15   Resp arrest after  conscious  sedation   Compression of brain (HCC)    Constipation    COPD (chronic obstructive pulmonary disease) ([place])     Phreesia 11/04/2020   Coronary artery disease    40-50% mid LAD 04/29/09, Medical tx. (Dr. Gwenlyn Found)   Diabetes mellitus    Type II- reports being off all medication d/t it being controlled   Diabetes mellitus without complication ([place])    Phreesia 11/04/2020   Essential (primary) hypertension    Fibromyalgia    Fibromyalgia    History of blood transfusion    hemorrage duinrg pregancy   Hx of echocardiogram 10/2011   EF 55-60%   Hypercholesterolemia    Hyperlipidemia, unspecified    Hypertension    Hypertensive chronic kidney disease with stage 1 through stage 4 chronic kidney disease, or unspecified chronic kidney disease    Hypertensive chronic kidney disease with stage 5 chronic kidney disease or end stage renal disease (HCC)    Lymph edema    Lymphedema, not elsewhere classified    Morbid (severe) obesity due to excess calories ([place])    Obesity hypoventilation syndrome ([place])    On home oxygen therapy    "2L; 24/7" (12/21/2018)   Peritonitis, unspecified ([place])    Pneumonia    in past   Pulmonary hypertension ([place])    Renal disorder    M/W/F [facility] Pt started dialysis in Dec.2016   S/P colonoscopy 05/26/2007   Dr. Laural Golden sigmoid diverticulosis random biopsies benign   S/P endoscopy 05/01/2009   Dr. Penelope Coop pill-induced esophageal ulcerations distal to midesophagus, 2 small ulcers in the antrum of the stomach   Shortness of breath dyspnea    with any exertion or if heart rate  is irregular while on dialysis   Sleep apnea    reports that she no longer needs CPAP due to weight loss   Sleep apnea, unspecified    Type 2 diabetes mellitus with diabetic nephropathy ([place])    Type 2 diabetes mellitus with hyperglycemia ([place])     Past Surgical History:  Procedure Laterality Date   .Hemodialysis catheter Right 11/28/2015   A/V FISTULAGRAM N/A 07/26/2017   Procedure: A/V Fistulagram - Left Arm;  Surgeon: Serafina Mitchell, MD;  Location: [place] [facility];  Service:  Cardiovascular;  Laterality: N/A;   ABDOMINAL HYSTERECTOMY     total hysterectomy   ADENOIDECTOMY     AV FISTULA PLACEMENT Left 03/15/2016  Procedure: CREATION OF LEFT ARM ARTERIOVENOUS (AV) FISTULA  ;  Surgeon: Rosetta Posner, MD;  Location: [place];  Service: Vascular;  Laterality: Left;   CAPD INSERTION N/A 08/30/2017   Procedure: LAPAROSCOPIC INSERTION CONTINUOUS AMBULATORY PERITONEAL DIALYSIS  (CAPD) CATHETER;  Surgeon: Clovis Riley, MD;  Location: [place];  Service: General;  Laterality: N/A;   CESAREAN SECTION      x 2   CESAREAN SECTION N/A    Phreesia 11/04/2020   CRANIECTOMY SUBOCCIPITAL W/ CERVICAL LAMINECTOMY / CHIARI     FISTULA SUPERFICIALIZATION Left 05/10/2016   Procedure: Left Arm FISTULA SUPERFICIALIZATION;  Surgeon: Rosetta Posner, MD;  Location: [place];  Service: Vascular;  Laterality: Left;   IR GENERIC HISTORICAL  08/14/2016   IR REMOVAL TUN ACCESS W/ PORT W/O FL MOD SED 08/14/2016 Arne Cleveland, MD [facility]INTERV RAD   IR SINUS/FIST TUBE CHK-NON GI  01/01/2019   IR US GUIDE BX ASP/DRAIN  12/28/2018   MINOR REMOVAL OF PERITONEAL DIALYSIS CATHETER N/A 04/26/2019   Procedure: REMOVAL OF INFECTED PERITONEAL DIALYSIS CATHETER;  Surgeon: Erroll Luna, MD;  Location: [place];  Service: General;  Laterality: N/A;   PERIPHERAL VASCULAR BALLOON ANGIOPLASTY Left 10/18/2019   Procedure: PERIPHERAL VASCULAR BALLOON ANGIOPLASTY;  Surgeon: Marty Heck, MD;  Location: [place] [facility];  Service: Cardiovascular;  Laterality: Left;  arm fistula   PERIPHERAL VASCULAR CATHETERIZATION Left 11/25/2016   Procedure: A/V Fistulagram;  Surgeon: Conrad Baird, MD;  Location: [place] [facility];  Service: Cardiovascular;  Laterality: Left;  arm   PORTACATH PLACEMENT  07/05/2012   Procedure: INSERTION PORT-A-CATH;  Surgeon: Donato Heinz, MD;  Location: [facility];  Service: General;  Laterality: Left;  subclavian   portacath removal     RIGHT HEART CATH N/A 08/04/2017   Procedure: RIGHT HEART CATH;   Surgeon: Jolaine Artist, MD;  Location: [place] [facility];  Service: Cardiovascular;  Laterality: N/A;   THYROIDECTOMY, PARTIAL     TONSILLECTOMY       Home Medications:  Prior to Admission medications   Medication Sig Start Date End Date Taking? Authorizing Provider  acetaminophen (TYLENOL) 325 MG tablet Take 2 tablets (650 mg total) by mouth every 4 (four) hours as needed for mild pain (or Fever >/= 101). 07/04/20  Yes Emokpae, Courage, MD  calcium acetate (PHOSLO) 667 MG capsule Take 1,334 mg by mouth 3 (three) times daily. 05/27/21  Yes [provider]  calcium carbonate (TUMS - DOSED IN MG ELEMENTAL CALCIUM) 500 MG chewable tablet Chew 2 tablets by mouth 2 (two) times daily.   Yes [provider]  cephALEXin (KEFLEX) 500 MG capsule Take 1 capsule (500 mg total) by mouth 2 (two) times daily. Try to take this every 12 hours. On dialysis days, take the first dose after dialysis and the second at bedtime. 06/21/21  Yes Tat, Shanon Brow, MD  ibuprofen (ADVIL) 200 MG tablet Take 400 mg by mouth every 6 (six) hours as needed for moderate pain.   Yes [provider]  lanthanum (FOSRENOL) 500 MG chewable tablet Chew 1,000-1,500 mg by mouth 5 (five) times daily. 1500 mg by mouth three times a day with meals and 1000 mg by mouth twice a day with snacks   Yes [provider]  loperamide (IMODIUM) 2 MG capsule Take 2-4 mg by mouth as needed for diarrhea or loose stools.    Yes [provider]  multivitamin (RENA-VIT) TABS tablet Take 1 tablet by mouth daily. 05/30/18  Yes [provider]  Oxycodone HCl 10 MG TABS Take 1 tablet (10 mg total) by mouth 4 (four) times daily as needed (pain). 06/21/21  Yes Tat, Shanon Brow, MD  Sennosides (EX-LAX PO) Take 2 tablets by mouth daily as needed (constipation).   Yes [provider]  clindamycin (CLEOCIN) 300 MG capsule Take 1 capsule (300 mg total) by mouth 3 (three) times daily. 06/21/21   Orson Eva, MD  lactulose  (CHRONULAC) 10 GM/15ML solution Take 45 mLs (30 g total) by mouth 2 (two) times daily. For constipation Patient not taking: Reported on 06/21/2021 04/30/19   Rai, Vernelle Emerald, MD  insulin lispro (HUMALOG) 100 UNIT/ML injection Inject 60 Units into the skin 2 (two) times daily.    03/08/12  [provider]    Inpatient Medications: Scheduled Meds:  aspirin  81 mg Oral Daily   calcium acetate  667 mg Oral TID WC   insulin aspart  0-20 Units Subcutaneous TID WC   insulin aspart  0-5 Units Subcutaneous QHS   mouth rinse  15 mL Mouth Rinse BID   pantoprazole (PROTONIX) IV  40 mg Intravenous QHS   sodium zirconium cyclosilicate  10 g Oral Once   Continuous Infusions:  sodium chloride     sodium chloride     ceFEPime (MAXIPIME) IV     doxycycline (VIBRAMYCIN) IV Stopped (06/22/21 1334)   heparin 900 Units/hr (06/22/21 1400)   lactated ringers     metronidazole Stopped (06/22/21 0937)   norepinephrine (LEVOPHED) Adult infusion Stopped (06/22/21 1049)   phenylephrine ([name]-SYNEPHRINE) Adult infusion 70 mcg/min (06/22/21 1400)    sodium bicarbonate (isotonic) infusion in sterile water 100 mL/hr at 06/22/21 1400   PRN Meds: docusate sodium, lactulose, oxyCODONE, polyethylene glycol  Allergies:    Allergies  Allergen Reactions   Contrast Media [Iodinated Diagnostic Agents] Anaphylaxis, Hives, Swelling and Other (See Comments)    Dye for cardiac cath. Tongue swells   Pneumococcal Vaccines Swelling and Other (See Comments)    Turns skin black, and bodily swelling   Vancomycin Nausea And Vomiting and Other (See Comments)    Infusion "made me feel like I was dying" had to be readmitted to hospital    Social History:   Social History   Socioeconomic History   Marital status: Married    Spouse name: Not on file   Number of children: 3   Years of education: Not on file   Highest education level: Not on file  Occupational History   Not on file  Tobacco Use   Smoking status: Never    Smokeless tobacco: Never  Vaping Use   Vaping Use: Never used  Substance and Sexual Activity   Alcohol use: No    Alcohol/week: 0.0 standard drinks   Drug use: No   Sexual activity: Not Currently  Other Topics Concern   Not on file  Social History Narrative   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  General:no colds or fevers, no weight changes Skin:no rashes or ulcers HEENT:no blurred vision, no congestion CV:see HPI PUL:see HPI GI:no diarrhea constipation or melena, no indigestion GU:no hematuria, no dysuria MS:no joint pain, no claudication Neuro:no syncope, no lightheadedness, + lethargy Endo:+ diabetes but insulin stopped a few years ago, no thyroid disease  All other ROS reviewed and negative.     Physical Exam/Data:   Vitals:   06/22/21 1315 06/22/21 1330 06/22/21 1345 06/22/21 1400  BP: (!) 99/59 (!) 104/37 102/67   Pulse: 81 73 72 70  Resp: _0 Temp:      TempSrc:      SpO2: 95% 97% 97% 96%  Weight:      Height:        Intake/Output Summary (Last 24 hours) at 06/22/2021 1429 Last data filed at 06/22/2021 1400 Gross per 24 hour  Intake 5464.16 ml  Output --  Net 5464.16 ml   Last 3 Weights 06/22/2021 06/21/2021 06/19/2021  Weight (lbs) 286 lb 13.1 oz 297 lb 9.9 oz 297 lb 9.9 oz  Weight (kg) 130.1 kg 135 kg 135 kg     Body mass index is 47.73 kg/m.  General:  Well nourished, well developed, in no acute distress HEENT: normal Neck: Neck is full Endocrine:  No thryomegaly Vascular: No  carotid bruits; FA pulses 2+ bilaterally without bruits  Cardiac:  normal S1, S2;(incrased P2) RRR; no murmur gallup rub or click Lungs:  clear to auscultation bilaterally though diminished in bases, no wheezing, rhonchi or rales  Abd: soft, nontender, no hepatomegaly  Ext: Signif swelling R thigh with some tenderness   + lymphedema chronic  Musculoskeletal:  No deformities, BUE and BLE strength normal and equal Skin: warm and dry  Neuro:  alert and oriented X 3 MAE follows commands, no focal abnormalities noted Psych:  Normal affect  Relevant CV Studies:  Echo 06/22/21 with EF 60-65% with normal LV function.  No RWMA + moderate LVH    diastolic parameters are consistent with Grade I diastolic dysfunction  (impaired relaxation). The left ventricular cavity is small and may be  underfilled. There is no evidence of LVTO Obstruction.   2. Right ventricular systolic function is moderately to severely reduced.  The right ventricular size is severely enlarged. There is severely  elevated pulmonary artery systolic pressure. The estimated right  ventricular systolic pressure is 93.2 mmHg.   3. Tricuspid valve regurgitation is moderate to severe and functional in  nature.   4. Right atrial size was severely dilated.   5. The aortic valve is tricuspid. There is mild thickening of the aortic  valve. Aortic valve regurgitation is not visualized. No aortic stenosis is  present. Aortic valve mean gradient measures 4.0 mmHg.   6. The mitral valve is grossly normal. No evidence of mitral valve  regurgitation. The mean mitral valve gradient is 1.0 mmHg with average  heart rate of 79 bpm.   7. The inferior vena cava is dilated in size with <50% respiratory  variability, suggesting right atrial pressure of 15 mmHg.   Comparison(s): A prior study was performed on 07/12/2017. Prior images  reviewed side by side. RV function has worsened.   FINDINGS   Left Ventricle: Left ventricular ejection fraction, by  estimation, is 60  to 65%. The left ventricle has normal function. The left ventricle has no  regional wall motion abnormalities. Definity contrast agent was given IV  to delineate the left ventricular   endocardial borders. The  left ventricular internal cavity size was small.  There is moderate concentric left ventricular hypertrophy. Left  ventricular diastolic parameters are consistent with Grade I diastolic  dysfunction (impaired relaxation).   Right Ventricle: The right ventricular size is severely enlarged. Right  vetricular wall thickness was not assessed. Right ventricular systolic  function is moderately reduced. There is severely elevated pulmonary  artery systolic pressure. The tricuspid  regurgitant velocity is 3.67 m/s, and with an assumed right atrial  pressure of 15 mmHg, the estimated right ventricular systolic pressure is  27.7 mmHg.   Left Atrium: Left atrial size was normal in size.   Right Atrium: Right atrial size was severely dilated.   Pericardium: There is no evidence of pericardial effusion.   Mitral Valve: The mitral valve is grossly normal. Mild mitral annular  calcification. No evidence of mitral valve regurgitation. MV peak  gradient, 2.7 mmHg. The mean mitral valve gradient is 1.0 mmHg with  average heart rate of 79 bpm.   Tricuspid Valve: The tricuspid valve is normal in structure. Tricuspid  valve regurgitation is moderate to severe.   Aortic Valve: The aortic valve is tricuspid. There is mild thickening of  the aortic valve. Aortic valve regurgitation is not visualized. No aortic  stenosis is present. Aortic valve mean gradient measures 4.0 mmHg. Aortic  valve peak gradient measures 8.1  mmHg. Aortic valve area, by VTI measures 2.24 cm.   Pulmonic Valve: Discordance between Color and Spectral Doppler. The  pulmonic valve was not well visualized. Pulmonic valve regurgitation is  mild. No evidence of pulmonic stenosis.   Aorta: The aortic root  and ascending aorta are structurally normal, with  no evidence of dilitation.   Venous: The inferior vena cava is dilated in size with less than 50%  respiratory variability, suggesting right atrial pressure of 15 mmHg.   IAS/Shunts: The atrial septum is grossly normal.      LEFT VENTRICLE  PLAX 2D  LVIDd:         3.30 cm  Diastology  LVIDs:         1.80 cm  LV e' medial:    8.38 cm/s  LV PW:         1.80 cm  LV E/e' medial:  9.8  LV IVS:        1.40 cm  LV e' lateral:   12.70 cm/s  LVOT diam:     1.90 cm  LV E/e' lateral: 6.4  LV SV:         53  LV SV Index:   23  LVOT Area:     2.84 cm      RIGHT VENTRICLE             IVC  RV Basal diam:  4.80 cm     IVC diam: 2.60 cm  RV Mid diam:    4.60 cm  RV S prime:     10.00 cm/s  TAPSE (M-mode): 1.7 cm   LEFT ATRIUM             Index       RIGHT ATRIUM           Index  LA diam:        3.30 cm 1.43 cm/m  RA Area:     31.60 cm  LA Vol (A2C):   78.7 ml 34.13 ml/m RA Volume:   129.00 ml 55.95 ml/m  LA Vol (A4C):   76.9 ml 33.35 ml/m  LA Biplane Vol:  78.1 ml 33.87 ml/m   AORTIC VALVE  AV Area (Vmax):    2.08 cm  AV Area (Vmean):   1.95 cm  AV Area (VTI):     2.24 cm  AV Vmax:           142.00 cm/s  AV Vmean:          96.200 cm/s  AV VTI:            0.235 m  AV Peak Grad:      8.1 mmHg  AV Mean Grad:      4.0 mmHg  LVOT Vmax:         104.00 cm/s  LVOT Vmean:        66.000 cm/s  LVOT VTI:          0.186 m  LVOT/AV VTI ratio: 0.79     AORTA  Ao Root diam: 3.10 cm  Ao Asc diam:  3.40 cm   MITRAL VALVE               TRICUSPID VALVE  MV Area (PHT): 3.45 cm    TR Peak grad:   53.9 mmHg  MV Area VTI:   2.29 cm    TR Vmax:        367.00 cm/s  MV Peak grad:  2.7 mmHg  MV Mean grad:  1.0 mmHg    SHUNTS  MV Vmax:       0.82 m/s    Systemic VTI:  0.19 m  MV Vmean:      54.4 cm/s   Systemic Diam: 1.90 cm  MV Decel Time: 220 msec  MV E velocity: 81.80 cm/s  MV A velocity: 67.60 cm/s  MV E/A ratio:  1.21   Rudean Haskell MD  Electronically signed by Rudean Haskell MD  Signature Date/Time: 06/22/2021/9:24:35 AM         Final     Echo 06/2017  Study Conclusions   - Left ventricle: The cavity size was normal. Wall thickness was    increased in a pattern of moderate LVH. Systolic function was    normal. The estimated ejection fraction was in the range of 60%    to 65%. Wall motion was normal; there were no regional wall    motion abnormalities.  - Aortic valve: Mildly calcified annulus. Trileaflet; mildly    thickened leaflets. Valve area (VTI): 2.47 cm^2. Valve area    (Vmax): 2.07 cm^2. Valve area (Vmean): 2.29 cm^2.  - Mitral valve: Mildly calcified annulus. Mildly thickened leaflets    .  - Right ventricle: The cavity size was moderately to severely    dilated.  - Right atrium: The atrial septum bulges to the left consistent    with elevated RA pressure. The atrium was mildly dilated.  - Atrial septum: No defect or patent foramen ovale was identified.  - Tricuspid valve: There was moderate regurgitation.  - Pulmonary arteries: Systolic pressure was severely increased. PA    peak pressure: 78 mm Hg (S).  - Inferior vena cava: The vessel was dilated. The respirophasic    diameter changes were blunted (< 50%), consistent with elevated    central venous pressure.  - Technically adequate study.  Rt heart cath 07/2017 Findings:   RA = 11 RV = 76/13 PA = 76/24 (45) PCW = 19 Fick cardiac output/index = 8.7/3.7 PVR = 3.0 WU Ao sat = 99% PA sat = 75%, 75% High SVC sat = 71%   Assessment: 1.  Moderate PAH with high cardiac output 2. Relative well compensated left-sided pressures in setting of HD Cardiac cath 2010  SELECTIVE CORONARY ANGIOGRAPHY:  1. Left main normal.  2. LAD; the LAD had a 40-50% segmental stenosis in the mid portion      after the second small diagonal branch.  3. Circumflex; dominant and free of significant disease.  4. Right coronary artery; nondominant  and free of significant disease.  5. Left ventriculography; RAO left ventriculogram was performed using      25 mL of Visipaque dye at 12 mL per second.  The overall LVEF was      estimated greater than 60% without focal wall motion abnormalities.    IMPRESSION:  Ms. Beutler has mild-to-moderate segmental mid left  anterior descending disease which appears to be fairly similar by  description to her anatomy 3 years ago.  At this point, we will continue  with medical therapy, potentially obtain an outpatient stress test on  her.  If she continues to have chest pain, she may need intracoronary  vascular ultrasound-guided intervention.  Laboratory Data:  High Sensitivity Troponin:   Recent Labs  Lab 06/21/21 2254 06/22/21 0934  TROPONINIHS 326* 3,500*     Chemistry Recent Labs  Lab 06/20/21 0911 06/21/21 2254 06/22/21 0454 06/22/21 0934 06/22/21 1414  NA 133* 132*  --  133* 131*  K 3.6 4.8  --  5.0 4.7  CL 91* 93*  --  97*  --   CO2 27 17*  --  21*  --   GLUCOSE 119* 152*  --  142*  --   BUN 26* 44*  --  44*  --   CREATININE 6.14* 8.45* 8.60* 8.92*  --   CALCIUM 6.8* 7.4*  --  7.6*  --   GFRNONAA 8* 5* 5* 5*  --   ANIONGAP 15 22*  --  15  --     Recent Labs  Lab 06/19/21 1815 06/21/21 2254 06/22/21 0934  PROT 6.6 6.3* 5.8*  ALBUMIN 3.3* 3.0* 2.8*  AST 15 546* 1,893*  ALT 14 109* 97*  ALKPHOS 327* 378* 368*  BILITOT 0.5 0.8 0.6   Hematology Recent Labs  Lab 06/21/21 2130 06/22/21 0454 06/22/21 0934 06/22/21 1414  WBC 8.8 11.4* 10.0  --   RBC 3.63* 3.58* 3.54*  --   HGB 11.5* 11.1* 11.2* 11.6*  HCT 37.5 35.5* 34.9* 34.0*  MCV 103.3* 99.2 98.6  --   MCH 31.7 31.0 31.6  --   MCHC 30.7 31.3 32.1  --   RDW 14.7 14.4 14.4  --   PLT 203 177 181  --    BNPNo results for input(s): BNP, PROBNP in the last 168 hours.  DDimer No results for input(s): DDIMER in the last 168 hours.   Radiology/Studies:  CT ABDOMEN PELVIS WO CONTRAST  Result Date:  06/19/2021 CLINICAL DATA:  Abdominal and pelvic cellulitis.  Dialysis patient. EXAM: CT ABDOMEN AND PELVIS WITHOUT CONTRAST TECHNIQUE: Multidetector CT imaging of the abdomen and pelvis was performed following the standard protocol without IV contrast. COMPARISON:  04/20/2019 FINDINGS: Lower chest: No acute findings. Hepatobiliary: No mass visualized on this unenhanced exam. Prior cholecystectomy. No evidence of biliary obstruction. Pancreas: No mass or inflammatory process visualized on this unenhanced exam. Spleen:  Within normal limits in size. Adrenals/Urinary tract: A few tiny 1-2 mm calculi are noted in the upper poles of both kidneys. No evidence of ureteral calculi or hydronephrosis. Empty urinary bladder. Stomach/Bowel: No evidence of  obstruction, inflammatory process, or abnormal fluid collections. Diffuse colonic diverticulosis noted, however there is no evidence of diverticulitis. Vascular/Lymphatic: Stable 12 mm left common iliac lymph node. No other pathologically enlarged lymph nodes identified. Aortic atherosclerotic calcification noted. No evidence of abdominal aortic aneurysm. Reproductive: Prior hysterectomy noted. A cystic lesion is seen in the right adnexa which measures 3.2 x 2.6 cm. This is decreased in size compared to 5.6 x 3.5 cm previously, consistent with benign etiology. No evidence of ascites. Other: Skin thickening and edema of subcutaneous tissues is seen in the left lower quadrant anterior and lateral abdominal wall, consistent with cellulitis. There is no evidence of soft tissue gas or abscess. Musculoskeletal: Multiple Schmorl's nodes are noted in the lumbosacral vertebral bodies. No suspicious bone lesions identified. IMPRESSION: Findings compatible with cellulitis in left anterior lateral abdominal wall soft tissues. No evidence of soft tissue gas or abscess. Tiny bilateral renal calculi. No evidence of ureteral calculi or hydronephrosis. Colonic diverticulosis. No radiographic  evidence of diverticulitis. Decreased size of right adnexal cystic lesion, consistent with benign etiology. Aortic Atherosclerosis (ICD10-I70.0). Electronically Signed   By: Marlaine Hind M.D.   On: 06/19/2021 16:23   DG Ankle 2 Views Right  Result Date: 06/22/2021 CLINICAL DATA:  Right foot and ankle swelling/cellulitis. EXAM: RIGHT ANKLE - 2 VIEW COMPARISON:  CT, 06/19/2021 FINDINGS: No fracture or bone lesion. There is resorption a of the white cortical line along portions of the calcaneal tuberosity, more subtly along the inferior margin of the medial malleolus, suggesting osteomyelitis. However, the previous CT scan showed no convincing osteomyelitis. Ankle joint is normally spaced and aligned. There are arterial vascular calcifications and diffuse soft tissue swelling. No soft tissue air. IMPRESSION: 1. Radiographic findings suggestive of osteomyelitis of the calcaneal tuberosity and more subtly of the inferior aspect of the medial malleolus, not evident, however, on the recent CT. 2. No fracture or bone lesion. 3. Diffuse soft tissue swelling.  No soft tissue air. Electronically Signed   By: Lajean Manes M.D.   On: 06/22/2021 08:44   CT FEMUR RIGHT WO CONTRAST  Result Date: 06/19/2021 CLINICAL DATA:  Cellulitis with worsening edema. Right leg swelling. EXAM: CT OF THE LOWER RIGHT EXTREMITY WITHOUT CONTRAST TECHNIQUE: Multidetector CT imaging of the right lower extremity was performed according to the standard protocol. COMPARISON:  None. FINDINGS: Bones/Joint/Cartilage No fracture, erosion, periosteal reaction or focal bone abnormality. Moderate knee osteoarthritis. Lateral patellar tilt. Moderate hip osteoarthritis. Small knee joint effusion. No visualized hip joint effusion. Ligaments Suboptimally assessed by CT. Muscles and Tendons Mild generalized fatty atrophy.  No intramuscular collection. Soft tissues Skin thickening about the medial and lateral aspect of the thigh. Medial skin thickening appears  symmetric to the included left lower extremity were visualized. There is subcutaneous edema that is confluent medially. Multifocal soft tissue calcifications, both punctate, amorphous, slightly popcorn in appearance. Greatest degree of dystrophic calcifications are seen posterior medially. No soft tissue air. No discrete focal fluid collection. There are moderate vascular calcifications. No drainable fluid collection. IMPRESSION: 1. Skin thickening about the medial and lateral aspect of the thigh with subcutaneous edema that is confluent medially. Findings may be secondary to cellulitis or lymphedema. No soft tissue air or discrete focal fluid collection. 2. Multifocal soft tissue calcifications, both punctate, amorphous, slightly popcorn in appearance. These are likely dystrophic, likely sequela of chronic renal failure. 3. No acute osseous abnormality. Moderate knee and hip osteoarthritis. Small knee joint effusion. 4. Moderate vascular calcifications. Electronically Signed   By:  Keith Rake M.D.   On: 06/19/2021 19:44   CT TIBIA FIBULA RIGHT WO CONTRAST  Result Date: 06/19/2021 CLINICAL DATA:  Cellulitis with worsening edema. EXAM: CT OF THE LOWER RIGHT EXTREMITY WITHOUT CONTRAST TECHNIQUE: Multidetector CT imaging of the right lower extremity was performed according to the standard protocol. COMPARISON:  None. FINDINGS: Bones/Joint/Cartilage No fracture, erosion, or focal bone abnormality. No periosteal reaction. The bones appear under mineralized. Moderate knee osteoarthritis. Small knee joint effusion. Ligaments Suboptimally assessed by CT. Muscles and Tendons Mild fatty atrophy.  No intramuscular collection. Soft tissues Skin thickening about the medial and lateral aspect of the calf. Soft tissue edema about the ankle and distal aspect of the lower leg anteriorly. There are scattered soft tissue calcifications, most prominently medially, primarily punctate and amorphous. No focal fluid collection.  There is no soft tissue air. Moderate vascular calcifications are seen. IMPRESSION: 1. Skin thickening about the medial and lateral aspect of the calf. Soft tissue edema about the ankle and distal aspect of the lower leg anteriorly. No focal fluid collection or soft tissue air. 2. Scattered soft tissue calcifications. These are likely dystrophic and related to chronic renal disease. 3. No acute osseous abnormality. 4. Moderate peripheral vascular calcifications. Electronically Signed   By: Keith Rake M.D.   On: 06/19/2021 19:47   CT FOOT RIGHT WO CONTRAST  Result Date: 06/19/2021 CLINICAL DATA:  Cellulitis with worsening edema. EXAM: CT OF THE RIGHT FOOT WITHOUT CONTRAST TECHNIQUE: Multidetector CT imaging of the right foot was performed according to the standard protocol. Multiplanar CT image reconstructions were also generated. COMPARISON:  None. FINDINGS: Bones/Joint/Cartilage No fracture, erosion, or periosteal reaction. Mild osteoarthritis. No bone destruction. There is no ankle joint effusion. Plantar calcaneal spur. Ligaments Suboptimally assessed by CT. Muscles and Tendons Fatty atrophy of the intrinsic musculature of the foot. Enthesopathic changes of the Achilles tendon insertion. Soft tissues Mild generalized subcutaneous edema. No focal fluid collection. There are scattered punctate calcifications in the soft tissues. Arterial vascular calcifications are seen. IMPRESSION: 1. Mild generalized subcutaneous edema. No focal fluid collection. 2. No acute osseous abnormality. Electronically Signed   By: Keith Rake M.D.   On: 06/19/2021 19:49   US Venous Img Lower Bilateral (DVT)  Result Date: 06/20/2021 CLINICAL DATA:  55 year old with leg pain. EXAM: BILATERAL LOWER EXTREMITY VENOUS DOPPLER ULTRASOUND TECHNIQUE: Gray-scale sonography with graded compression, as well as color Doppler and duplex ultrasound were performed to evaluate the lower extremity deep venous systems from the level of the  common femoral vein and including the common femoral, femoral, profunda femoral, popliteal and calf veins including the posterior tibial, peroneal and gastrocnemius veins when visible. The superficial great saphenous vein was also interrogated. Spectral Doppler was utilized to evaluate flow at rest and with distal augmentation maneuvers in the common femoral, femoral and popliteal veins. COMPARISON:  01/25/2017 FINDINGS: RIGHT LOWER EXTREMITY Common Femoral Vein: No evidence of thrombus. Normal compressibility, respiratory phasicity and response to augmentation. Saphenofemoral Junction: No evidence of thrombus. Normal compressibility and flow on color Doppler imaging. Profunda Femoral Vein: No evidence of thrombus. Normal compressibility and flow on color Doppler imaging. Femoral Vein: No evidence of thrombus. Normal compressibility, respiratory phasicity and response to augmentation. Popliteal Vein: No evidence of thrombus. Normal compressibility, respiratory phasicity and response to augmentation. Calf Veins: Visualized right deep calf veins are patent without thrombus. Superficial Great Saphenous Vein: No evidence of thrombus. Normal compressibility. Other Findings:  None. LEFT LOWER EXTREMITY Common Femoral Vein: No evidence of thrombus.  Normal compressibility, respiratory phasicity and response to augmentation. Saphenofemoral Junction: No evidence of thrombus. Normal compressibility and flow on color Doppler imaging. Profunda Femoral Vein: No evidence of thrombus. Normal compressibility and flow on color Doppler imaging. Femoral Vein: No evidence of thrombus. Normal compressibility, respiratory phasicity and response to augmentation. Popliteal Vein: No evidence of thrombus. Normal compressibility, respiratory phasicity and response to augmentation. Calf Veins: Limited evaluation and cannot be evaluated. Superficial Great Saphenous Vein: No evidence of thrombus. Normal compressibility. Other Findings:  None.  IMPRESSION: Technically challenging examination but no clear evidence for deep venous thrombosis in either lower extremity. In particular, cannot evaluate for deep vein thrombosis in the left deep calf veins. Electronically Signed   By: Markus Daft M.D.   On: 06/20/2021 10:38   DG Chest Port 1 View  Result Date: 06/21/2021 CLINICAL DATA:  Weakness, fatigue, and hypotension after recent admission for cellulitis. EXAM: PORTABLE CHEST 1 VIEW COMPARISON:  06/30/2020 FINDINGS: Cardiac enlargement. Prominent left pulmonary outflow tract. No airspace disease or consolidation in the lungs. No pleural effusions. No pneumothorax. Calcification of the aorta. No change since prior study. IMPRESSION: Cardiac enlargement.  No evidence of active pulmonary disease. Electronically Signed   By: Lucienne Capers M.D.   On: 06/21/2021 22:36   DG Foot 2 Views Right  Result Date: 06/22/2021 CLINICAL DATA:  Swelling and cellulitis in the right foot and ankle. EXAM: RIGHT FOOT - 2 VIEW COMPARISON:  Right ankle 06/22/2021 FINDINGS: Diffuse soft tissue swelling throughout the right foot. Negative for fracture or dislocation. Mild spurring and sclerosis involving the calcaneus. Diffuse vascular calcifications. IMPRESSION: No acute bone abnormality to the right foot. Electronically Signed   By: Markus Daft M.D.   On: 06/22/2021 08:41   ECHOCARDIOGRAM COMPLETE  Result Date: 06/22/2021    ECHOCARDIOGRAM REPORT   Patient Name:   EVERETT RICCIARDELLI Date of Exam: 06/22/2021 Medical Rec #:  [id number]        Height:       65.0 in Accession #:    [id number]       Weight:       286.8 lb Date of Birth:  Aug 23, 1966        BSA:          2.306 m Patient Age:    9 years         BP:           86/58 mmHg Patient Gender: F                HR:           87 bpm. Exam Location:  Inpatient Procedure: 2D Echo, Color Doppler, Cardiac Doppler and Intracardiac            Opacification Agent                          STAT ECHO Reported to: Dr Rudean Haskell on  06/22/2021 8:57:00 AM. Indications:    Elevated troponin  History:        Patient has prior history of Echocardiogram examinations, most                 recent 07/12/2017. CHF; Risk Factors:Hypertension, Diabetes and                 Dyslipidemia. ESRD. Cor pulmonale.  Sonographer:    Clayton Lefort RDCS (AE) Referring Phys: [id number] Julian Hy  Sonographer Comments: Technically difficult study due  to poor echo windows and patient is morbidly obese. Image acquisition challenging due to patient body habitus. IMPRESSIONS  1. Left ventricular ejection fraction, by estimation, is 60 to 65%. The left ventricle has normal function. The left ventricle has no regional wall motion abnormalities. There is moderate concentric left ventricular hypertrophy. Left ventricular diastolic parameters are consistent with Grade I diastolic dysfunction (impaired relaxation). The left ventricular cavity is small and may be underfilled. There is no evidence of LVTO Obstruction.  2. Right ventricular systolic function is moderately to severely reduced. The right ventricular size is severely enlarged. There is severely elevated pulmonary artery systolic pressure. The estimated right ventricular systolic pressure is 67.1 mmHg.  3. Tricuspid valve regurgitation is moderate to severe and functional in nature.  4. Right atrial size was severely dilated.  5. The aortic valve is tricuspid. There is mild thickening of the aortic valve. Aortic valve regurgitation is not visualized. No aortic stenosis is present. Aortic valve mean gradient measures 4.0 mmHg.  6. The mitral valve is grossly normal. No evidence of mitral valve regurgitation. The mean mitral valve gradient is 1.0 mmHg with average heart rate of 79 bpm.  7. The inferior vena cava is dilated in size with <50% respiratory variability, suggesting right atrial pressure of 15 mmHg. Comparison(s): A prior study was performed on 07/12/2017. Prior images reviewed side by side. RV function has worsened.  FINDINGS  Left Ventricle: Left ventricular ejection fraction, by estimation, is 60 to 65%. The left ventricle has normal function. The left ventricle has no regional wall motion abnormalities. Definity contrast agent was given IV to delineate the left ventricular  endocardial borders. The left ventricular internal cavity size was small. There is moderate concentric left ventricular hypertrophy. Left ventricular diastolic parameters are consistent with Grade I diastolic dysfunction (impaired relaxation). Right Ventricle: The right ventricular size is severely enlarged. Right vetricular wall thickness was not assessed. Right ventricular systolic function is moderately reduced. There is severely elevated pulmonary artery systolic pressure. The tricuspid regurgitant velocity is 3.67 m/s, and with an assumed right atrial pressure of 15 mmHg, the estimated right ventricular systolic pressure is 24.5 mmHg. Left Atrium: Left atrial size was normal in size. Right Atrium: Right atrial size was severely dilated. Pericardium: There is no evidence of pericardial effusion. Mitral Valve: The mitral valve is grossly normal. Mild mitral annular calcification. No evidence of mitral valve regurgitation. MV peak gradient, 2.7 mmHg. The mean mitral valve gradient is 1.0 mmHg with average heart rate of 79 bpm. Tricuspid Valve: The tricuspid valve is normal in structure. Tricuspid valve regurgitation is moderate to severe. Aortic Valve: The aortic valve is tricuspid. There is mild thickening of the aortic valve. Aortic valve regurgitation is not visualized. No aortic stenosis is present. Aortic valve mean gradient measures 4.0 mmHg. Aortic valve peak gradient measures 8.1 mmHg. Aortic valve area, by VTI measures 2.24 cm. Pulmonic Valve: Discordance between Color and Spectral Doppler. The pulmonic valve was not well visualized. Pulmonic valve regurgitation is mild. No evidence of pulmonic stenosis. Aorta: The aortic root and ascending  aorta are structurally normal, with no evidence of dilitation. Venous: The inferior vena cava is dilated in size with less than 50% respiratory variability, suggesting right atrial pressure of 15 mmHg. IAS/Shunts: The atrial septum is grossly normal.  LEFT VENTRICLE PLAX 2D LVIDd:         3.30 cm  Diastology LVIDs:         1.80 cm  LV e' medial:  8.38 cm/s LV PW:         1.80 cm  LV E/e' medial:  9.8 LV IVS:        1.40 cm  LV e' lateral:   12.70 cm/s LVOT diam:     1.90 cm  LV E/e' lateral: 6.4 LV SV:         53 LV SV Index:   23 LVOT Area:     2.84 cm  RIGHT VENTRICLE             IVC RV Basal diam:  4.80 cm     IVC diam: 2.60 cm RV Mid diam:    4.60 cm RV S prime:     10.00 cm/s TAPSE (M-mode): 1.7 cm LEFT ATRIUM             Index       RIGHT ATRIUM           Index LA diam:        3.30 cm 1.43 cm/m  RA Area:     31.60 cm LA Vol (A2C):   78.7 ml 34.13 ml/m RA Volume:   129.00 ml 55.95 ml/m LA Vol (A4C):   76.9 ml 33.35 ml/m LA Biplane Vol: 78.1 ml 33.87 ml/m  AORTIC VALVE AV Area (Vmax):    2.08 cm AV Area (Vmean):   1.95 cm AV Area (VTI):     2.24 cm AV Vmax:           142.00 cm/s AV Vmean:          96.200 cm/s AV VTI:            0.235 m AV Peak Grad:      8.1 mmHg AV Mean Grad:      4.0 mmHg LVOT Vmax:         104.00 cm/s LVOT Vmean:        66.000 cm/s LVOT VTI:          0.186 m LVOT/AV VTI ratio: 0.79  AORTA Ao Root diam: 3.10 cm Ao Asc diam:  3.40 cm MITRAL VALVE               TRICUSPID VALVE MV Area (PHT): 3.45 cm    TR Peak grad:   53.9 mmHg MV Area VTI:   2.29 cm    TR Vmax:        367.00 cm/s MV Peak grad:  2.7 mmHg MV Mean grad:  1.0 mmHg    SHUNTS MV Vmax:       0.82 m/s    Systemic VTI:  0.19 m MV Vmean:      54.4 cm/s   Systemic Diam: 1.90 cm MV Decel Time: 220 msec MV E velocity: 81.80 cm/s MV A velocity: 67.60 cm/s MV E/A ratio:  1.21 Rudean Haskell MD Electronically signed by Rudean Haskell MD Signature Date/Time: 06/22/2021/9:24:35 AM    Final    US Abdomen Limited RUQ  (LIVER/GB)  Result Date: 06/22/2021 CLINICAL DATA:  Elevated liver function studies. EXAM: ULTRASOUND ABDOMEN LIMITED RIGHT UPPER QUADRANT COMPARISON:  CT 06/19/2021 FINDINGS: Gallbladder: Gallbladder is surgically absent. Common bile duct: Diameter: 3 mm, normal Liver: Coarsening of liver echotexture with nodular hepatic contour suggesting cirrhosis. No focal lesions identified. Small amount of free fluid consistent with ascites. Portal vein is patent on color Doppler imaging with normal direction of blood flow towards the liver. Other: None. IMPRESSION: Gallbladder surgically absent. Probable cirrhotic changes in the liver. Small amount of free fluid  consistent with ascites. Electronically Signed   By: Lucienne Capers M.D.   On: 06/22/2021 02:56     Assessment and Plan:   Shock with Right heart failure.   Pt with long standing Hx of cor pulmonale     First diagnosed in 2018.  R heart cath at the time showed PVR 3 WU.  Since then she has not been seen in cardiology or readmitted   Now with recent admit for cellulitis  Readmitted with  after recent Rx for cellulitis for lethargy  ? If continued infection with resultant hypotension (a similar trigger like what she had in 2018 that lead to decompensation of R heart failure)   She is receiving IV fluid bolus and is on neo   Would wean pressors as BP tolerates    IF cannot, need to conside central line with COOX and possible milrinone   may need full R heart cath  Recent LE dopplers wer negatve (7/2)   Currently on heparin for elevated troponin    Elevated tropnin 300s to peak so far of  3500.  Pt with hx of mild CAD of  mid LAD lesion 2010  Pt without CP   Echo today  shows normal LVEF , Severe RV dysfunction with pulmonary HTN   Troponin may reflect myocardial strain in setting of severe pulmonary HTN and systemic hypotension. 3   Rhythm:   Pt has had atrial flutter on admit  Long RP tachycardia (self limited for about 2 hours) and SR/ST   Rhythms may  be exacerbating current clinical condition    Follow   keep on heparin   CAD   Last cath in 2010 with 40 to 50% LAD stensosi    Follow 4.   Transaminitis possible from congestion vs shock liver   5    Cellulitis with elevated lactic acid blood cultures pending. On IV ABX.  6   ESRD on HD on MWF for dialysis nephrology following  holding dialysis today with presser requirements.  7      DM-2 per CCM.        For questions or updates, please contact [place] Please consult [email or web address] for contact info under    Signed, Cecilie Kicks, NP  06/22/2021 2:29 PM   Pt seen and examined.  I have reviewed note by L INgold above and amended it extensively to reflect my findings Pt is a 55 yo with hx of cor pulmonale   initially diagnosed back in 2018   R heart cath at the time  (see above )  No indication for vasodilator Rx at the time    Presents today for readmission following discharge yesterday   Lethargic, hypotensive with elevated lactic acid Being treated again for cellulitis   On pressors for hypotension Troponin significantly elevated to 3000 Note AST elevated at [place] [facility] with ALT only mildly elevated   Echo LVEF nromal  RV enlargemnet /dysfunction present with severe pulmonary HTN Recent LE dopplers neg (2 days ago)  On exam, Pt currently appears relatively comfortable in bed  Curr in SR (was in flutter yesterday and SVT (rates 110s)    Neck is full Lungs are CTA   Cardiac exam;   RRR  Increased P2   Gr II/VI systolic murmur LSB to base Abd is supple  No RUQ tenderness Ext   1-2+ edema  R thigh edematous , tender     Current presentation is ssomewhat similar to when she was admitted in 2018 when infected.  May need central line and COOX if does not turn.   Consider R heart cath to reevaluate pressures  Troponin elevation:  Continue to track  May reflect demand ischemia in setting of severe pulmonary HTN and RV dysfucntion   Agree with heparin  Sats of 97-98% and neg USN legs (7/2)  go against PE  Elevated AST   Repeat along with ALT, GGT and LDH  Rhythm:  Continue to follow on tele   Transient flutter   Transient SVT    SHould be on long term anticoagulation     Pt remains critically ill.  Dorris Carnes MD

## 2021-06-22 NOTE — Progress Notes (Addendum)
[place] Progress Note Patient Name: JOWANNA LOEFFLER DOB: 1966-09-14 MRN: [id number]   Date of Service  06/22/2021  HPI/Events of Note  Troponin = 326 --> 2161 (value called by lab, however, not in Epic yet). Can't B-Block as patient is on vasopressors. EKG per previous note.  eICU Interventions  Plan: ASA 81 mg PO now and Q day. Heparin IV infusion per pharmacy consult.     Intervention Category Major Interventions: Other:  Calene Paradiso Cornelia Copa 06/22/2021, 6:07 AM

## 2021-06-22 NOTE — Consult Note (Signed)
ESRD Consult Note  Requesting provider: Julian Hy, DO  Assessment/Recommendations:   # ESRD: Outpatient orders: [facility], Monday Wednesday Friday, 255 minutes.  250 BFR/500 DFR of the refill of her medications..  1K, 2.5 calcium, 138 sodium, EDW 135.5 kg, 15-gauge needles, machine temp 37 -hold on HD today given pressor requirements. No acute indication for renal replacement therapy at this time. If no improvement in BP +/- worsening volume status then likely will need CRRT via temp line in the interim. Potential HD tomorrow if off pressors (or Wed depending on labs and volume status)  Sepsis, septic shock likely secondary to sepsis -abx and pressor support per ccm/priamry -echo done, read pending  Lactic acidosis, metabolic acidosis -on nahco3 gtt per primary, check labs today as she may not need this  Volume/ hypertension: EDW 135.5kg. Will UF as tolerated  Anemia of Chronic Kidney Disease: Hemoglobin 11.1. Currently receiving epo 12k units and venofer 11m. Holding on IV iron  Secondary Hyperparathyroidism/Hyperphosphatemia: sensipar 630qtx   Vascular access: lue avf +b/t  DM2 with hyperglycemia: mgmt per primary service  # Additional recommendations: - Dose all meds for creatinine clearance < 10 ml/min  - Unless absolutely necessary, no MRIs with gadolinium.  - Implement save arm precautions.  Prefer needle sticks in the dorsum of the hands or wrists.  No blood pressure measurements in arm. - If blood transfusion is requested during hemodialysis sessions, please alert uKoreaprior to the session.  - If a hemodialysis catheter line culture is requested, please alert u[place]oreaas only hemodialysis nurses are able to collect those specimens.   Recommendations were discussed with the primary team.  VGean Quint MD [facility]  History of Present Illness: Lindsay PLACZEKis a/an 55y.o. female with a past medical history of ESRD, CHF, DM, hypertension, COPD,  CAD, lymphedema, recent cellulitis who presents with lethargy, hypotension, elevated lactic acid.  Septic shock likely related to her cellulitis.  Requiring Levophed and phenyl for pressor support.   Medications:  Current Facility-Administered Medications  Medication Dose Route Frequency Provider Last Rate Last Admin   0.9 %  sodium chloride infusion  250 mL Intravenous Continuous BHayden Rasmussen MD       0.9 %  sodium chloride infusion   Intravenous Continuous GCollier Bullock MD 100 mL/hr at 06/22/21 0311 New Bag at 06/22/21 0311   0.9 %  sodium chloride infusion  250 mL Intravenous Continuous SAnders Simmonds MD       aspirin chewable tablet 81 mg  81 mg Oral Daily SAnders Simmonds MD   81 mg at 06/22/21 0811   calcium acetate (PHOSLO) capsule 667 mg  667 mg Oral TID WC GCollier Bullock MD   667 mg at 06/22/21 0810   ceFEPIme (MAXIPIME) 1 g in sodium chloride 0.9 % 100 mL IVPB  1 g Intravenous Q24H LErenest Blank RPH       docusate sodium (COLACE) capsule 100 mg  100 mg Oral BID PRN GCollier Bullock MD       doxycycline (VIBRAMYCIN) 100 mg in sodium chloride 0.9 % 250 mL IVPB  100 mg Intravenous Q12H BHayden Rasmussen MD   Stopped at 06/22/21 0055   heparin ADULT infusion 100 units/mL (25000 units/2521m  900 Units/hr Intravenous Continuous LeErenest BlankRPH 9 mL/hr at 06/22/21 0645 900 Units/hr at 06/22/21 0645   insulin aspart (novoLOG) injection 0-20 Units  0-20 Units Subcutaneous TID WC GoCollier BullockMD   4 Units  at 06/22/21 4098   insulin aspart (novoLOG) injection 0-5 Units  0-5 Units Subcutaneous QHS Collier Bullock, MD       lactated ringers infusion   Intravenous Continuous Hayden Rasmussen, MD       lactulose (CHRONULAC) 10 GM/15ML solution 30 g  30 g Oral Daily PRN Collier Bullock, MD       metroNIDAZOLE (FLAGYL) IVPB 500 mg  500 mg Intravenous Q8H Collier Bullock, MD       norepinephrine (LEVOPHED) 18m in 2577mpremix infusion  2-10 mcg/min Intravenous  Titrated BuHayden RasmussenMD 11.25 mL/hr at 06/22/21 0645 3 mcg/min at 06/22/21 0645   oxyCODONE (ROXICODONE) 5 MG/5ML solution 5 mg  5 mg Oral Q4H PRN GoCollier BullockMD       pantoprazole (PROTONIX) injection 40 mg  40 mg Intravenous QHS GoCollier BullockMD       phenylephrine (NEOSYNEPHRINE) 10-0.9 MG/250ML-% infusion  25-200 mcg/min Intravenous Titrated SoAnders SimmondsMD 37.5 mL/hr at 06/22/21 0645 25 mcg/min at 06/22/21 0645   polyethylene glycol (MIRALAX / GLYCOLAX) packet 17 g  17 g Oral Daily PRN GoCollier BullockMD       sodium bicarbonate 150 mEq in sterile water 1,150 mL infusion   Intravenous Continuous ClJulian HyDO         ALLERGIES Contrast media [iodinated diagnostic agents], Pneumococcal vaccines, and Vancomycin  MEDICAL HISTORY Past Medical History:  Diagnosis Date   Allergy    Phreesia 11/04/2020   Anemia, unspecified    Anxiety    Arthritis    Phreesia 11/04/2020   Asthma    as a child   Cancer ([facility] [place]   thyroid   Cellulitis    CHF (congestive heart failure) ([facility]   Chiari malformation    s/p surgery   Chiari malformation    Chronic kidney disease    Phreesia 11/04/2020   Chronic kidney disease, stage 5 ([facility]   Chronic obstructive pulmonary disease, unspecified ([facility]   Chronic pain    Complication of anesthesia 11/28/15   Resp arrest after  conscious  sedation   Compression of brain (HCC)    Constipation    COPD (chronic obstructive pulmonary disease) ([facility]   Phreesia 11/04/2020   Coronary artery disease    40-50% mid LAD 04/29/09, Medical tx. (Dr. BeGwenlyn Found  Diabetes mellitus    Type II- reports being off all medication d/t it being controlled   Diabetes mellitus without complication ([facility] [place]   Phreesia 11/04/2020   Essential (primary) hypertension    Fibromyalgia    Fibromyalgia    History of blood transfusion    hemorrage duinrg pregancy   Hx of echocardiogram 10/2011   EF 55-60%   Hypercholesterolemia    Hyperlipidemia,  unspecified    Hypertension    Hypertensive chronic kidney disease with stage 1 through stage 4 chronic kidney disease, or unspecified chronic kidney disease    Hypertensive chronic kidney disease with stage 5 chronic kidney disease or end stage renal disease (HCC)    Lymph edema    Lymphedema, not elsewhere classified    Morbid (severe) obesity due to excess calories (H[facility]   Obesity hypoventilation syndrome (HCCastro   On home oxygen therapy    "2L; 24/7" (12/21/2018)   Peritonitis, unspecified (H[facility]   Pneumonia    in past   Pulmonary hypertension ([facility]   Renal disorder    M/W/F [facility] Pt started dialysis in Dec.2016  S/P colonoscopy 05/26/2007   Dr. Laural Golden sigmoid diverticulosis random biopsies benign   S/P endoscopy 05/01/2009   Dr. Penelope Coop pill-induced esophageal ulcerations distal to midesophagus, 2 small ulcers in the antrum of the stomach   Shortness of breath dyspnea    with any exertion or if heart rate  is irregular while on dialysis   Sleep apnea    reports that she no longer needs CPAP due to weight loss   Sleep apnea, unspecified    Type 2 diabetes mellitus with diabetic nephropathy (HCC)    Type 2 diabetes mellitus with hyperglycemia ([place])      SOCIAL HISTORY Social History   Socioeconomic History   Marital status: Married    Spouse name: Not on file   Number of children: 3   Years of education: Not on file   Highest education level: Not on file  Occupational History   Not on file  Tobacco Use   Smoking status: Never   Smokeless tobacco: Never  Vaping Use   Vaping Use: Never used  Substance and Sexual Activity   Alcohol use: No    Alcohol/week: 0.0 standard drinks   Drug use: No   Sexual activity: Not Currently  Other Topics Concern   Not on file  Social History Narrative   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   Social Determinants of Health   Financial Resource Strain: Not on file  Food Insecurity: Not on file  Transportation Needs: Not on  file  Physical Activity: Not on file  Stress: Not on file  Social Connections: Not on file  Intimate Partner Violence: Not on file     FAMILY HISTORY Family History  Problem Relation Age of Onset   Colon cancer Mother 70   Stroke Mother 52   Coronary artery disease Mother      Review of Systems: 12 systems were reviewed and negative except per HPI  Physical Exam: Vitals:   06/22/21 0725 06/22/21 0800  BP:    Pulse:  80  Resp:  16  Temp: 97.9 F (36.6 C)   SpO2:     No intake/output data recorded.  Intake/Output Summary (Last 24 hours) at 06/22/2021 0818 Last data filed at 06/22/2021 0645 Gross per 24 hour  Intake 3499.42 ml  Output --  Net 3499.42 ml   General: well-appearing, no acute distress HEENT: anicteric sclera, MMM CV: normal rate, no murmurs, no edema Lungs: bilateral chest rise, normal wob Abd: obese, soft, non-tender, non-distended Skin: no visible lesions or rashes Ext: bl LE lymphedema, rle warm Neuro: awake, alert, normal speech, no gross focal deficits  HD access: LUE AVF +b/t  Test Results Reviewed Lab Results  Component Value Date   NA 132 (L) 06/21/2021   K 4.8 06/21/2021   CL 93 (L) 06/21/2021   CO2 17 (L) 06/21/2021   BUN 44 (H) 06/21/2021   CREATININE 8.60 (H) 06/22/2021   CALCIUM 7.4 (L) 06/21/2021   ALBUMIN 3.0 (L) 06/21/2021   PHOS 6.6 (H) 07/04/2020    I have reviewed relevant outside healthcare records

## 2021-06-22 NOTE — Progress Notes (Signed)
ANTICOAGULATION CONSULT NOTE - Follow-Up  Pharmacy Consult for Heparin  Indication: chest pain/ACS, elevated troponin, r/o PE/DVT  Patient Measurements: Height: _0  (165.1 cm) Weight: 130.1 kg (286 lb 13.1 oz) IBW/kg (Calculated) : 57 Heparin Dosing Weight: 90 kg  Vital Signs: Temp: 97.7 F (36.5 C) (07/04 1503) Temp Source: Oral (07/04 1503) BP: 127/103 (07/04 1500) Pulse Rate: 69 (07/04 1500)  Labs: Recent Labs    06/19/21 1815 06/20/21 0610 06/21/21 2130 06/21/21 2254 06/22/21 0454 06/22/21 0934 06/22/21 1400 06/22/21 1414  HGB  --    < > 11.5*  --  11.1* 11.2*  --  11.6*  HCT  --    < > 37.5  --  35.5* 34.9*  --  34.0*  PLT  --    < > 203  --  177 181  --   --   APTT 31  --   --   --   --   --   --   --   LABPROT 14.6  --   --   --   --   --   --   --   INR 1.1  --   --   --   --   --   --   --   HEPARINUNFRC  --   --   --   --   --   --  0.15*  --   CREATININE  --    < >  --  8.45* 8.60* 8.92* 8.39*  --   CKTOTAL  --   --   --   --   --   --  120  --   TROPONINIHS  --   --   --  326*  --  3,500*  --   --    < > = values in this interval not displayed.     Estimated Creatinine Clearance: 10.4 mL/min (A) (by C-G formula based on SCr of 8.39 mg/dL (H)).  Assessment: 55 y/o F transfer from [facility] with r/o sepsis and hypotension requiring vasopressor support. Starting heparin for elevated troponin, also with concern for PE/DVT. Pharmacy consulted to dose.   Heparin level this afternoon is SUBtherapeutic (HL 0.15, goal of 0.3-0.7). No bleeding or issues noted per RN  Goal of Therapy:  Heparin level 0.3-0.7 units/ml Monitor platelets by anticoagulation protocol: Yes   Plan:  - Heparin 2500 units bolus x 1 - Increase Heparin to 1200 units/hr (12 ml/hr) - Will continue to monitor for any signs/symptoms of bleeding and will follow up with heparin level in 8 hours   Thank you for allowing pharmacy to be a part of this patient's care.  Alycia Rossetti, PharmD,  BCPS Clinical Pharmacist Clinical phone for 06/22/2021: [phone] 06/22/2021 3:16 PM   **Pharmacist phone directory can now be found on [email or web address] ([email or web address]).  Listed under [place].

## 2021-06-22 NOTE — Progress Notes (Signed)
VAST consulted to place midline d/t pt being extremely difficult lab draw and wanting to avoid placement of CVC. SecureChat sent to Janett Billow, RN and Carlis Abbott, DO educating that since patient is a renal patient with CrCl <30 a midline is inappropriate and cannot be placed with out a nephrologist's order. Also noted that nephrologist wrote in note earlier today "- Implement save arm precautions.  Prefer needle sticks in the dorsum of the hands or wrists.  No blood pressure measurements in arm". Janett Billow, RN to contact nephrologist.

## 2021-06-22 NOTE — Progress Notes (Addendum)
NAME:  [name], MRN:  [id number], DOB:  01-08-1966, LOS: 1 ADMISSION DATE:  06/21/2021, CONSULTATION DATE:  06/21/21  REFERRING MD:  Melina Copa, ED, CHIEF COMPLAINT:  lethargy    History of Present Illness:  [name] is a 55 year old woman with hx of obesity, lymphedema, ESRD on HD MWF, CHF, recently hospitalized at [facility] for cellulitis, discharged 7/3, returning back same day with lethargy, hypotension and elevated lactic acid.   She tells me her family was worried about her after d/c because she was sleeping a lot, called to have her brought back to hospital.  No change in pain /tenderness to RLE.   In ED: LR bolus 1.8, doxycicline, cefepime, flagyl given.  Pertinent  Medical History  Anemia Thyroid cancer,  CHF Chiari malformation, ESRD on HD MWF COPD?  CAD HLD  HTN DM lymphedema  Meds; acetaminophen, phoslo, keflex 06/21/21, clinda 06/21/21, lactulose prn, imodium prn, mvi,oxycodone 68m QID prn, senna, insulin/lispro BID  Significant Hospital Events: Including procedures, antibiotic start and stop dates in addition to other pertinent events   7/3 admitted to ICU on vasopressors, tachyarrhythmias overnight. Switched from NE> neosynephrine. On heparin. 7/4 Echo with acute on subacute R heart failure, LVH with no RWMA. Trop 3500.   Interim History / Subjective:  She denies complaints. No CP, SOB. Not on O2 at home.  Objective   Blood pressure (!) 86/58, pulse 87, temperature 97.9 F (36.6 C), temperature source Oral, resp. rate 18, height 5' 5" (1.651 m), weight 130.1 kg, SpO2 95 %.        Intake/Output Summary (Last 24 hours) at 06/22/2021 0803 Last data filed at 06/22/2021 0645 Gross per 24 hour  Intake 3499.42 ml  Output --  Net 3499.42 ml    Filed Weights   06/21/21 2123 06/22/21 0100  Weight: 135 kg 130.1 kg    Examination: General: chronically ill appearing woman lying in bed in NAD HENT: New /AT, eyes anicteric Lungs: breathing comfortably on 8L [place], distant  breath sounds. No conversational dyspnea. Cardiovascular: reg rate & rhythm, S1S2 Abdomen: obese, soft, NT Extremities: some tenderness around lateral R buttock and hip- not warm or erythematous. Chronic edema in feet.  Neuro: sleepy, but arouses to be awake and alert, able to answer questions and maintain eye contact. Moves all extremities Derm: warm, dry. Buttocks not able to be examined due to positioning.  CXR personally reviewed: cardiomegaly, no significant pulmonary edema or opacities  EKG: inferior TWI  Na+ 133 Bicarb 21 BUN 44 Cr 8.92 AST 1893 ALT 97 T. Bili 0.6 Trop 326> 3500 La 9.5>7.6>2.4 WBC 10   Resolved Hospital Problem list     Assessment & Plan:  Shock- concerned for PE causing obstructive shock based on echo with worsened RV failure, significant oxygen requirement (although she presented on RA), recent hospitalization. No PTA chronic AC. Negative LE [place]2 days ago suggests against this. Other possibilities are high output heart failure due to dialysis fistula. OHS is possible with her BMI to explain worsened RHF. Septic shock is also possible, but seems less likely so soon after her discharge. -Has history of serious contrast allergy with tongue swelling; given high risk will defer CTA at this time. Con't heparin.  -Cardiology consultation for assistance with managing severe right heart failure. -LE venous [place]ompleted 2 days ago> negative. Check d-dimer. -ABG to assess for chronic hypoventilation -repeat troponin until peaked. BNP level, recheck EKG. -vasopressors to maintain MAP >65; wean as tolerated -discussed with cardiology, who  will see the patient  Acute respiratory failure with hypoxia-- appears new in the past 24 hours. Discharge exam indicates saturations in the low to mid-90s. Atelectasis could explain her rapid increase since admission. -wean O2 as tolerated to maintain SpO2 >90% -empiric PE treatment  Lactic acidosis due to shock, down  trended -maintain adequate perfusion -hopefully can stop bicarb  Hyponatremia, potentially due to heart failure -monitor  Congestive hepatopathy vs shock liver. Unclear why AST level is disproportionately elevated compared to ALT. RUQ demonstrates possible cirrhosis, surgically absent GB. Would expect more fulminant liver failure with venous thrombosis, so this is unlikely. -check CK level -trend transaminases  Cellulitis, possibly with sepsis: no other clear source. Blood and urine cultures pending.  -checking procalcitonin level -con't empiric antibiotics -follow cultures until finalized -vasopressors as required to maintain MAP >65  ESRD, last HD Friday. No urgent indications today. -Nephrology consult; will defer access placement with hopes that she can be weaned off pressors and not require temp access  DM -SSI PRN -goal BG 140-180  Chronic anemia due to ESRD -transfuse for Hb <7 or hemodynamically significant bleeding  Best Practice (right click and "Reselect all SmartList Selections" daily)   Diet/type: Regular consistency (see orders) DVT prophylaxis: systemic heparin GI prophylaxis: PPI Lines: N/A Foley:  N/A Code Status:  full code Last date of multidisciplinary goals of care discussion [  ]  Labs   CBC: Recent Labs  Lab 06/19/21 1225 06/20/21 0610 06/20/21 0911 06/21/21 2130 06/22/21 0454  WBC 7.0 8.9 7.2 8.8 11.4*  NEUTROABS 4.9  --   --  7.1  --   HGB 11.4* 12.4 11.7* 11.5* 11.1*  HCT 36.7 39.7 36.8 37.5 35.5*  MCV 98.4 102.6* 101.4* 103.3* 99.2  PLT 201 176 179 203 177     Basic Metabolic Panel: Recent Labs  Lab 06/19/21 1225 06/20/21 0610 06/20/21 0911 06/21/21 2254 06/22/21 0454  NA 135 135 133* 132*  --   K 2.5* 3.9 3.6 4.8  --   CL 95* 93* 91* 93*  --   CO2 _0 17*  --   GLUCOSE 96 117* 119* 152*  --   BUN 19 25* 26* 44*  --   CREATININE 4.53* 5.72* 6.14* 8.45* 8.60*  CALCIUM 8.3* 7.4* 6.8* 7.4*  --     GFR: Estimated  Creatinine Clearance: 10.2 mL/min (A) (by C-G formula based on SCr of 8.6 mg/dL (H)). Recent Labs  Lab 06/19/21 1446 06/20/21 0610 06/20/21 0911 06/21/21 2130 06/21/21 2258 06/22/21 0454  WBC  --  8.9 7.2 8.8  --  11.4*  LATICACIDVEN 1.4  --   --  9.5* 7.6*  --      Liver Function Tests: Recent Labs  Lab 06/19/21 1815 06/21/21 2254  AST 15 546*  ALT 14 109*  ALKPHOS 327* 378*  BILITOT 0.5 0.8  PROT 6.6 6.3*  ALBUMIN 3.3* 3.0*    No results for input(s): LIPASE, AMYLASE in the last 168 hours. No results for input(s): AMMONIA in the last 168 hours.  ABG    Component Value Date/Time   PHART 7.290 (L) 01/24/2017 1140   PCO2ART 41.0 01/24/2017 1140   PO2ART 85 01/24/2017 1140   HCO3 29.2 (H) 08/04/2017 0751   TCO2 26 10/18/2019 1014   ACIDBASEDEF 6.3 (H) 01/24/2017 1140   O2SAT 71.0 08/04/2017 0751      Coagulation Profile: Recent Labs  Lab 06/19/21 1815  INR 1.1     Cardiac Enzymes: No results for  input(s): CKTOTAL, CKMB, CKMBINDEX, TROPONINI in the last 168 hours.  HbA1C: Hgb A1c MFr Bld  Date/Time Value Ref Range Status  06/22/2021 04:54 AM 6.9 (H) 4.8 - 5.6 % Final    Comment:    (NOTE) Pre diabetes:          5.7%-6.4%  Diabetes:              >6.4%  Glycemic control for   <7.0% adults with diabetes   06/30/2020 03:28 AM 7.2 (H) 4.8 - 5.6 % Final    Comment:    (NOTE) Pre diabetes:          5.7%-6.4%  Diabetes:              >6.4%  Glycemic control for   <7.0% adults with diabetes     CBG: Recent Labs  Lab 06/20/21 1617 06/20/21 2025 06/21/21 0740 06/22/21 0207 06/22/21 0723  GLUCAP 112* 99 87 181* 164*      This patient is critically ill with multiple organ system failure which requires frequent high complexity decision making, assessment, support, evaluation, and titration of therapies. This was completed through the application of advanced monitoring technologies and extensive interpretation of multiple databases. During this  encounter critical care time was devoted to patient care services described in this note for 65 minutes.  Julian Hy, DO 06/22/21 8:03 AM [facility]       Later in the day examined her cellulitis on her R hip- indurated, tender, but only mildly warm and not erythematous. No [place] emphysema palpable. Seems disproportionate to her hypotension/ pressor requirements.  Julian Hy, DO 06/22/21 4:47 PM [facility]

## 2021-06-23 ENCOUNTER — Inpatient Hospital Stay (HOSPITAL_COMMUNITY): Payer: 59

## 2021-06-23 ENCOUNTER — Ambulatory Visit: Payer: 59 | Admitting: Nurse Practitioner

## 2021-06-23 DIAGNOSIS — I272 Pulmonary hypertension, unspecified: Secondary | ICD-10-CM | POA: Diagnosis not present

## 2021-06-23 DIAGNOSIS — R6521 Severe sepsis with septic shock: Secondary | ICD-10-CM | POA: Diagnosis not present

## 2021-06-23 DIAGNOSIS — A419 Sepsis, unspecified organism: Secondary | ICD-10-CM | POA: Diagnosis not present

## 2021-06-23 LAB — HEPARIN LEVEL (UNFRACTIONATED)
Heparin Unfractionated: 0.14 IU/mL — ABNORMAL LOW (ref 0.30–0.70)
Heparin Unfractionated: 0.12 IU/mL — ABNORMAL LOW (ref 0.30–0.70)

## 2021-06-23 LAB — COMPREHENSIVE METABOLIC PANEL
ALT: 32 U/L (ref 0–44)
AST: 1508 U/L — ABNORMAL HIGH (ref 15–41)
Albumin: 2.9 g/dL — ABNORMAL LOW (ref 3.5–5.0)
Alkaline Phosphatase: 374 U/L — ABNORMAL HIGH (ref 38–126)
Anion gap: 14 (ref 5–15)
BUN: 52 mg/dL — ABNORMAL HIGH (ref 6–20)
CO2: 22 mmol/L (ref 22–32)
Calcium: 7.7 mg/dL — ABNORMAL LOW (ref 8.9–10.3)
Chloride: 95 mmol/L — ABNORMAL LOW (ref 98–111)
Creatinine, Ser: 9.23 mg/dL — ABNORMAL HIGH (ref 0.44–1.00)
GFR, Estimated: 5 mL/min — ABNORMAL LOW (ref >60)
Glucose, Bld: 79 mg/dL (ref 70–99)
Potassium: 5.1 mmol/L (ref 3.5–5.1)
Sodium: 131 mmol/L — ABNORMAL LOW (ref 135–145)
Total Bilirubin: 0.8 mg/dL (ref 0.3–1.2)
Total Protein: 5.9 g/dL — ABNORMAL LOW (ref 6.5–8.1)

## 2021-06-23 LAB — COOXEMETRY PANEL
Carboxyhemoglobin: 0.5 % (ref 0.5–1.5)
Methemoglobin: 1 % (ref 0.0–1.5)
O2 Saturation: 68.9 %
Total hemoglobin: 13.1 g/dL (ref 12.0–16.0)

## 2021-06-23 LAB — CBC
HCT: 33.8 % — ABNORMAL LOW (ref 36.0–46.0)
HCT: 34.8 % — ABNORMAL LOW (ref 36.0–46.0)
Hemoglobin: 10.9 g/dL — ABNORMAL LOW (ref 12.0–15.0)
Hemoglobin: 11.6 g/dL — ABNORMAL LOW (ref 12.0–15.0)
MCH: 31.4 pg (ref 26.0–34.0)
MCH: 32.1 pg (ref 26.0–34.0)
MCHC: 32.2 g/dL (ref 30.0–36.0)
MCHC: 33.3 g/dL (ref 30.0–36.0)
MCV: 97.4 fL (ref 80.0–100.0)
MCV: 96.4 fL (ref 80.0–100.0)
Platelets: 174 10*3/uL (ref 150–400)
Platelets: 151 10*3/uL (ref 150–400)
RBC: 3.47 MIL/uL — ABNORMAL LOW (ref 3.87–5.11)
RBC: 3.61 MIL/uL — ABNORMAL LOW (ref 3.87–5.11)
RDW: 14.2 % (ref 11.5–15.5)
RDW: 14.4 % (ref 11.5–15.5)
WBC: 10.4 10*3/uL (ref 4.0–10.5)
WBC: 8.3 10*3/uL (ref 4.0–10.5)
nRBC: 1.3 % — ABNORMAL HIGH (ref 0.0–0.2)
nRBC: 1.2 % — ABNORMAL HIGH (ref 0.0–0.2)

## 2021-06-23 LAB — GLUCOSE, CAPILLARY
Glucose-Capillary: 55 mg/dL — ABNORMAL LOW (ref 70–99)
Glucose-Capillary: 59 mg/dL — ABNORMAL LOW (ref 70–99)
Glucose-Capillary: 95 mg/dL (ref 70–99)
Glucose-Capillary: 88 mg/dL (ref 70–99)
Glucose-Capillary: 98 mg/dL (ref 70–99)
Glucose-Capillary: 95 mg/dL (ref 70–99)

## 2021-06-23 LAB — PROTIME-INR
INR: 1.9 — ABNORMAL HIGH (ref 0.8–1.2)
Prothrombin Time: 21.4 seconds — ABNORMAL HIGH (ref 11.4–15.2)

## 2021-06-23 LAB — BASIC METABOLIC PANEL
Anion gap: 13 (ref 5–15)
BUN: 55 mg/dL — ABNORMAL HIGH (ref 6–20)
CO2: 22 mmol/L (ref 22–32)
Calcium: 7.4 mg/dL — ABNORMAL LOW (ref 8.9–10.3)
Chloride: 99 mmol/L (ref 98–111)
Creatinine, Ser: 9.05 mg/dL — ABNORMAL HIGH (ref 0.44–1.00)
GFR, Estimated: 5 mL/min — ABNORMAL LOW (ref >60)
Glucose, Bld: 96 mg/dL (ref 70–99)
Potassium: 4.6 mmol/L (ref 3.5–5.1)
Sodium: 134 mmol/L — ABNORMAL LOW (ref 135–145)

## 2021-06-23 LAB — LACTATE DEHYDROGENASE: LDH: 1820 U/L — ABNORMAL HIGH (ref 98–192)

## 2021-06-23 LAB — PROCALCITONIN: Procalcitonin: 7.21 ng/mL

## 2021-06-23 MED ORDER — HEPARIN SODIUM (PORCINE) 1000 UNIT/ML DIALYSIS: 1000.0000 [IU] | INTRAMUSCULAR | Status: DC | PRN

## 2021-06-23 MED ORDER — SODIUM CHLORIDE 0.9 % IV SOLN
6.0000 mg/kg | INTRAVENOUS | Status: DC
  Filled 2021-06-23: qty 16

## 2021-06-23 MED ORDER — ALTEPLASE 2 MG IJ SOLR: 2.0000 mg | Freq: Once | INTRAMUSCULAR | Status: DC | PRN

## 2021-06-23 MED ORDER — SODIUM CHLORIDE 0.9 % IV SOLN: 100.0000 mL | INTRAVENOUS | Status: DC | PRN

## 2021-06-23 MED ORDER — HEPARIN BOLUS VIA INFUSION
2500.0000 [IU] | Freq: Once | INTRAVENOUS | Status: AC
  Administered 2021-06-23: 2500 [IU] via INTRAVENOUS
  Filled 2021-06-23: qty 2500

## 2021-06-23 MED ORDER — SODIUM CHLORIDE 0.9 % IV SOLN
6.0000 mg/kg | INTRAVENOUS | Status: AC
  Administered 2021-06-23 – 2021-06-28 (×3): 500 mg via INTRAVENOUS
  Filled 2021-06-23 (×4): qty 10

## 2021-06-23 MED ORDER — LIDOCAINE-PRILOCAINE 2.5-2.5 % EX CREA: 1.0000 "application " | TOPICAL_CREAM | CUTANEOUS | Status: DC | PRN

## 2021-06-23 MED ORDER — TECHNETIUM TO 99M ALBUMIN AGGREGATED
4.2000 | Freq: Once | INTRAVENOUS | Status: AC | PRN
  Administered 2021-06-23: 4.2 via INTRAVENOUS

## 2021-06-23 MED ORDER — MIDODRINE HCL 5 MG PO TABS
5.0000 mg | ORAL_TABLET | Freq: Three times a day (TID) | ORAL | Status: DC
  Administered 2021-06-23 – 2021-06-25 (×8): 5 mg via ORAL
  Filled 2021-06-23 (×8): qty 1

## 2021-06-23 MED ORDER — ALBUMIN HUMAN 25 % IV SOLN
25.0000 g | Freq: Once | INTRAVENOUS | Status: AC
  Administered 2021-06-23: 25 g via INTRAVENOUS
  Filled 2021-06-23: qty 100

## 2021-06-23 MED ORDER — CHLORHEXIDINE GLUCONATE CLOTH 2 % EX PADS
6.0000 | MEDICATED_PAD | Freq: Every day | CUTANEOUS | Status: DC
  Administered 2021-06-24 – 2021-08-07 (×43): 6 via TOPICAL

## 2021-06-23 MED ORDER — LIDOCAINE HCL (PF) 1 % IJ SOLN: 5.0000 mL | INTRAMUSCULAR | Status: DC | PRN

## 2021-06-23 MED ORDER — ALBUMIN HUMAN 25 % IV SOLN
INTRAVENOUS | Status: AC
  Filled 2021-06-23: qty 100

## 2021-06-23 MED ORDER — PENTAFLUOROPROP-TETRAFLUOROETH EX AERO: 1.0000 "application " | INHALATION_SPRAY | CUTANEOUS | Status: DC | PRN

## 2021-06-23 NOTE — Progress Notes (Signed)
Checked in on patient. Tolerating IHD. UF goal 1L. Pressor requirements coming down. Patient reports that she is asymptomatic. Will get her back on her MWF schedule and attempt IHD again tomorrow with increased UF goal of 2L (or more depending on hemodynamics). Gean Quint, MD [facility]

## 2021-06-23 NOTE — Progress Notes (Signed)
NAME:  [name], MRN:  [id number], DOB:  [birth year], LOS: 2 ADMISSION DATE:  06/21/2021, CONSULTATION DATE:  06/21/21  REFERRING MD:  Melina Copa, ED, CHIEF COMPLAINT:  lethargy    History of Present Illness:  [name] is a 55 year old woman with hx of obesity, lymphedema, ESRD on HD MWF, CHF, recently hospitalized at [facility] for cellulitis, discharged 7/3, returning back same day with lethargy, hypotension and elevated lactic acid.   She tells me her family was worried about her after d/c because she was sleeping a lot, called to have her brought back to hospital.  No change in pain /tenderness to RLE.   In ED: LR bolus 1.8, doxycicline, cefepime, flagyl given.  Pertinent  Medical History  Anemia Thyroid cancer,  CHF Chiari malformation, ESRD on HD MWF COPD?  CAD HLD  HTN DM lymphedema  Meds; acetaminophen, phoslo, keflex 06/21/21, clinda 06/21/21, lactulose prn, imodium prn, mvi,oxycodone 66m QID prn, senna, insulin/lispro BID  Significant Hospital Events: Including procedures, antibiotic start and stop dates in addition to other pertinent events   7/3 admitted to ICU on vasopressors, tachyarrhythmias overnight. Switched from NE> neosynephrine. On heparin. 7/4 Echo with acute on subacute R heart failure, LVH with no RWMA. Trop 3500.   Interim History / Subjective:  Doing okay, states she feels like the swelling is moving "up" her leg.  Objective   Blood pressure 107/67, pulse 95, temperature 97.7 F (36.5 C), temperature source Oral, resp. rate 15, height _0  (1.651 m), weight 130.1 kg, SpO2 94 %.        Intake/Output Summary (Last 24 hours) at 06/23/2021 04235Last data filed at 06/23/2021 0600 Gross per 24 hour  Intake 4223.44 ml  Output --  Net 4223.44 ml    Filed Weights   06/21/21 2123 06/22/21 0100  Weight: 135 kg 130.1 kg    Examination: Constitutional: no acute distress  Eyes: EOMI, pupils equal Ears, nose, mouth, and throat: MMM, trachea  midline Cardiovascular: distant due to body habitus, sounds regular Respiratory: diminished bases, ext warm Gastrointestinal: soft, hypoactive bS Ext: lymphedema,  cannot really tell what is new and what is chronic Skin: No rashes, normal turgor Neurologic: weak but moves all 4 ext Psychiatric: RASS 0  Resolved Hospital Problem list     Assessment & Plan:  Acute on chronic RV failure with shock- triggered by RLE cellulitis.  PVR on prior cath was normal.  I wonder if her fistula is contributing.  Ablating this as an outpatient may improve her functional status. ESRD on HD RLE cellulitis- improved subjectively somewhat, she is complaining that her leg hurts further up OSA not on CPAP Congestive hepatopathy- improving  - Check coox, consider milrinone instead of neo; may need to bring CHF team on board - Given stable pressor needs and no obvious nec fasc signs of exam, I think we can hold off on imaging RLE today but low threshold to initiate - Switch abx to dapto monotherapy - Will need iHD or CRRT at some point  Best Practice (right click and "Reselect all SmartList Selections" daily)   Diet/type: Regular consistency (see orders) DVT prophylaxis: systemic heparin GI prophylaxis: PPI Lines: N/A Foley:  N/A Code Status:  full code Last date of multidisciplinary goals of care discussion [  ]   Patient critically ill due to shock Interventions to address this today pressor titration Risk of deterioration without these interventions is high  I personally spent 35 minutes providing critical care not including  any separately billable procedures  Erskine Emery MD  Pulmonary Critical Care  Prefer epic messenger for cross cover needs If after hours, please call E-link

## 2021-06-23 NOTE — Progress Notes (Signed)
[facility] Progress Note    Assessment/ Plan:   # ESRD: Outpatient orders: [facility], Monday Wednesday Friday, 255 minutes.  250 BFR/500 DFR of the refill of her medications..  1K, 2.5 calcium, 138 sodium, EDW 135.5 kg, 15-gauge needles, machine temp 37 -~4.5L net pos since admit, K 5.1. Starting to have worsening bl LE edema. Primary service would like to avoid a catheter for CRRT. Will attempt IHD today but may not be able to UF (low flow rates). If unsuccessful then will need CRRT. Discussed with patient and she agrees   Acute on chronic RV failure with shock Sepsis, septic shock likely secondary to sepsis -abx and pressor support per ccm/priamry -possible that her AVF is a contributing factor, will need to have this looked as an outpatient unless she is not making any improvements then would need vascular surgery to evaluate her for possible ligation -HF following, considering milrinone   Lactic acidosis, metabolic acidosis -off nahco3 gtt, improved   Volume/ hypertension: EDW 135.5kg. Will UF as tolerated   Anemia of Chronic Kidney Disease: Hemoglobin 10.9. Currently receiving epo 12k units and venofer 19m. Holding on IV iron   Secondary Hyperparathyroidism/Hyperphosphatemia: sensipar [id number]    Vascular access: lue avf +b/t   DM2 with hyperglycemia: mgmt per primary service   # Additional recommendations: - Dose all meds for creatinine clearance < 10 ml/min  - Unless absolutely necessary, no MRIs with gadolinium.  - Implement save arm precautions.  Prefer needle sticks in the dorsum of the hands or wrists.  No blood pressure measurements in arm. - If blood transfusion is requested during hemodialysis sessions, please alert u[place]oreaprior to the session. - If a hemodialysis catheter line culture is requested, please alert u[place]oreaas only hemodialysis nurses are able to collect those specimens.   Recommendations were discussed with the primary team.   VGean Quint MD [facility]  Subjective:   No acute events, on phenyl for pressor support now. Found to have acute on chronic rv failure on echo. She reports that she is starting to feels fluid accumulating in her legs. Otherwise no new complaints.   Objective:   BP 120/66   Pulse 96   Temp 97.7 F (36.5 C) (Oral)   Resp 16   Ht _0  (1.651 m)   Wt 130.1 kg   SpO2 95%   BMI 47.73 kg/m   Intake/Output Summary (Last 24 hours) at 06/23/2021 0915 Last data filed at 06/23/2021 0600 Gross per 24 hour  Intake 4072.84 ml  Output --  Net 4072.84 ml   Weight change:   Physical Exam: Gen:nad, sitting up in bed CVS:s1s2, rrr Resp:cta bl AIRC:VELFY soft, nt EBOF:BPZWCHENIDbilateral LE's w/ chronic skin changes Neuro: awake, alert, moves all ext spontaneously, speech clear and coherent HD access: lue avf +b/t  Imaging: DG Ankle 2 Views Right  Result Date: 06/22/2021 CLINICAL DATA:  Right foot and ankle swelling/cellulitis. EXAM: RIGHT ANKLE - 2 VIEW COMPARISON:  CT, 06/19/2021 FINDINGS: No fracture or bone lesion. There is resorption a of the white cortical line along portions of the calcaneal tuberosity, more subtly along the inferior margin of the medial malleolus, suggesting osteomyelitis. However, the previous CT scan showed no convincing osteomyelitis. Ankle joint is normally spaced and aligned. There are arterial vascular calcifications and diffuse soft tissue swelling. No soft tissue air. IMPRESSION: 1. Radiographic findings suggestive of osteomyelitis of the calcaneal tuberosity and more subtly of the inferior aspect of the medial malleolus,  not evident, however, on the recent CT. 2. No fracture or bone lesion. 3. Diffuse soft tissue swelling.  No soft tissue air. Electronically Signed   By: Lajean Manes M.D.   On: 06/22/2021 08:44   DG Chest Port 1 View  Result Date: 06/21/2021 CLINICAL DATA:  Weakness, fatigue, and hypotension after recent admission for cellulitis. EXAM:  PORTABLE CHEST 1 VIEW COMPARISON:  06/30/2020 FINDINGS: Cardiac enlargement. Prominent left pulmonary outflow tract. No airspace disease or consolidation in the lungs. No pleural effusions. No pneumothorax. Calcification of the aorta. No change since prior study. IMPRESSION: Cardiac enlargement.  No evidence of active pulmonary disease. Electronically Signed   By: Lucienne Capers M.D.   On: 06/21/2021 22:36   DG Foot 2 Views Right  Result Date: 06/22/2021 CLINICAL DATA:  Swelling and cellulitis in the right foot and ankle. EXAM: RIGHT FOOT - 2 VIEW COMPARISON:  Right ankle 06/22/2021 FINDINGS: Diffuse soft tissue swelling throughout the right foot. Negative for fracture or dislocation. Mild spurring and sclerosis involving the calcaneus. Diffuse vascular calcifications. IMPRESSION: No acute bone abnormality to the right foot. Electronically Signed   By: Markus Daft M.D.   On: 06/22/2021 08:41   ECHOCARDIOGRAM COMPLETE  Result Date: 06/22/2021    ECHOCARDIOGRAM REPORT   Patient Name:   [name] Date of Exam: 06/22/2021 Medical Rec #:  [id number]        Height:       65.0 in Accession #:    [id number]       Weight:       286.8 lb Date of Birth:  30-Mar-1966        BSA:          2.306 m Patient Age:    55 years         BP:           86/58 mmHg Patient Gender: F                HR:           87 bpm. Exam Location:  Inpatient Procedure: 2D Echo, Color Doppler, Cardiac Doppler and Intracardiac            Opacification Agent                          STAT ECHO Reported to: Dr Rudean Haskell on 06/22/2021 8:57:00 AM. Indications:    Elevated troponin  History:        Patient has prior history of Echocardiogram examinations, most                 recent 07/12/2017. CHF; Risk Factors:Hypertension, Diabetes and                 Dyslipidemia. ESRD. Cor pulmonale.  Sonographer:    Clayton Lefort RDCS (AE) Referring Phys: [id number] Julian Hy  Sonographer Comments: Technically difficult study due to poor echo windows and  patient is morbidly obese. Image acquisition challenging due to patient body habitus. IMPRESSIONS  1. Left ventricular ejection fraction, by estimation, is 60 to 65%. The left ventricle has normal function. The left ventricle has no regional wall motion abnormalities. There is moderate concentric left ventricular hypertrophy. Left ventricular diastolic parameters are consistent with Grade I diastolic dysfunction (impaired relaxation). The left ventricular cavity is small and may be underfilled. There is no evidence of LVTO Obstruction.  2. Right ventricular systolic function is moderately to severely reduced. The  right ventricular size is severely enlarged. There is severely elevated pulmonary artery systolic pressure. The estimated right ventricular systolic pressure is 65.7 mmHg.  3. Tricuspid valve regurgitation is moderate to severe and functional in nature.  4. Right atrial size was severely dilated.  5. The aortic valve is tricuspid. There is mild thickening of the aortic valve. Aortic valve regurgitation is not visualized. No aortic stenosis is present. Aortic valve mean gradient measures 4.0 mmHg.  6. The mitral valve is grossly normal. No evidence of mitral valve regurgitation. The mean mitral valve gradient is 1.0 mmHg with average heart rate of 79 bpm.  7. The inferior vena cava is dilated in size with <50% respiratory variability, suggesting right atrial pressure of 15 mmHg. Comparison(s): A prior study was performed on 07/12/2017. Prior images reviewed side by side. RV function has worsened. FINDINGS  Left Ventricle: Left ventricular ejection fraction, by estimation, is 60 to 65%. The left ventricle has normal function. The left ventricle has no regional wall motion abnormalities. Definity contrast agent was given IV to delineate the left ventricular  endocardial borders. The left ventricular internal cavity size was small. There is moderate concentric left ventricular hypertrophy. Left ventricular  diastolic parameters are consistent with Grade I diastolic dysfunction (impaired relaxation). Right Ventricle: The right ventricular size is severely enlarged. Right vetricular wall thickness was not assessed. Right ventricular systolic function is moderately reduced. There is severely elevated pulmonary artery systolic pressure. The tricuspid regurgitant velocity is 3.67 m/s, and with an assumed right atrial pressure of 15 mmHg, the estimated right ventricular systolic pressure is 90.3 mmHg. Left Atrium: Left atrial size was normal in size. Right Atrium: Right atrial size was severely dilated. Pericardium: There is no evidence of pericardial effusion. Mitral Valve: The mitral valve is grossly normal. Mild mitral annular calcification. No evidence of mitral valve regurgitation. MV peak gradient, 2.7 mmHg. The mean mitral valve gradient is 1.0 mmHg with average heart rate of 79 bpm. Tricuspid Valve: The tricuspid valve is normal in structure. Tricuspid valve regurgitation is moderate to severe. Aortic Valve: The aortic valve is tricuspid. There is mild thickening of the aortic valve. Aortic valve regurgitation is not visualized. No aortic stenosis is present. Aortic valve mean gradient measures 4.0 mmHg. Aortic valve peak gradient measures 8.1 mmHg. Aortic valve area, by VTI measures 2.24 cm. Pulmonic Valve: Discordance between Color and Spectral Doppler. The pulmonic valve was not well visualized. Pulmonic valve regurgitation is mild. No evidence of pulmonic stenosis. Aorta: The aortic root and ascending aorta are structurally normal, with no evidence of dilitation. Venous: The inferior vena cava is dilated in size with less than 50% respiratory variability, suggesting right atrial pressure of 15 mmHg. IAS/Shunts: The atrial septum is grossly normal.  LEFT VENTRICLE PLAX 2D LVIDd:         3.30 cm  Diastology LVIDs:         1.80 cm  LV e' medial:    8.38 cm/s LV PW:         1.80 cm  LV E/e' medial:  9.8 LV IVS:         1.40 cm  LV e' lateral:   12.70 cm/s LVOT diam:     1.90 cm  LV E/e' lateral: 6.4 LV SV:         53 LV SV Index:   23 LVOT Area:     2.84 cm  RIGHT VENTRICLE             IVC  RV Basal diam:  4.80 cm     IVC diam: 2.60 cm RV Mid diam:    4.60 cm RV S prime:     10.00 cm/s TAPSE (M-mode): 1.7 cm LEFT ATRIUM             Index       RIGHT ATRIUM           Index LA diam:        3.30 cm 1.43 cm/m  RA Area:     31.60 cm LA Vol (A2C):   78.7 ml 34.13 ml/m RA Volume:   129.00 ml 55.95 ml/m LA Vol (A4C):   76.9 ml 33.35 ml/m LA Biplane Vol: 78.1 ml 33.87 ml/m  AORTIC VALVE AV Area (Vmax):    2.08 cm AV Area (Vmean):   1.95 cm AV Area (VTI):     2.24 cm AV Vmax:           142.00 cm/s AV Vmean:          96.200 cm/s AV VTI:            0.235 m AV Peak Grad:      8.1 mmHg AV Mean Grad:      4.0 mmHg LVOT Vmax:         104.00 cm/s LVOT Vmean:        66.000 cm/s LVOT VTI:          0.186 m LVOT/AV VTI ratio: 0.79  AORTA Ao Root diam: 3.10 cm Ao Asc diam:  3.40 cm MITRAL VALVE               TRICUSPID VALVE MV Area (PHT): 3.45 cm    TR Peak grad:   53.9 mmHg MV Area VTI:   2.29 cm    TR Vmax:        367.00 cm/s MV Peak grad:  2.7 mmHg MV Mean grad:  1.0 mmHg    SHUNTS MV Vmax:       0.82 m/s    Systemic VTI:  0.19 m MV Vmean:      54.4 cm/s   Systemic Diam: 1.90 cm MV Decel Time: 220 msec MV E velocity: 81.80 cm/s MV A velocity: 67.60 cm/s MV E/A ratio:  1.21 Rudean Haskell MD Electronically signed by Rudean Haskell MD Signature Date/Time: 06/22/2021/9:24:35 AM    Final    US Abdomen Limited RUQ (LIVER/GB)  Result Date: 06/22/2021 CLINICAL DATA:  Elevated liver function studies. EXAM: ULTRASOUND ABDOMEN LIMITED RIGHT UPPER QUADRANT COMPARISON:  CT 06/19/2021 FINDINGS: Gallbladder: Gallbladder is surgically absent. Common bile duct: Diameter: 3 mm, normal Liver: Coarsening of liver echotexture with nodular hepatic contour suggesting cirrhosis. No focal lesions identified. Small amount of free fluid consistent  with ascites. Portal vein is patent on color Doppler imaging with normal direction of blood flow towards the liver. Other: None. IMPRESSION: Gallbladder surgically absent. Probable cirrhotic changes in the liver. Small amount of free fluid consistent with ascites. Electronically Signed   By: Lucienne Capers M.D.   On: 06/22/2021 02:56    Labs: BMET Recent Labs  Lab 06/19/21 1225 06/20/21 0610 06/20/21 0911 06/21/21 2254 06/22/21 0454 06/22/21 0934 06/22/21 1400 06/22/21 1414 06/23/21 0153  NA 135 135 133* 132*  --  133* 133* 131* 131*  K 2.5* 3.9 3.6 4.8  --  5.0 4.8 4.7 5.1  CL 95* 93* 91* 93*  --  97* 97*  --  95*  CO2 _0 17*  --  21* 23  --  22  GLUCOSE 96 117* 119* 152*  --  142* 97  --  79  BUN 19 25* 26* 44*  --  44* 46*  --  52*  CREATININE 4.53* 5.72* 6.14* 8.45* 8.60* 8.92* 8.39*  --  9.23*  CALCIUM 8.3* 7.4* 6.8* 7.4*  --  7.6* 7.3*  --  7.7*   CBC Recent Labs  Lab 06/19/21 1225 06/20/21 0610 06/21/21 2130 06/22/21 0454 06/22/21 0934 06/22/21 1414 06/23/21 0153  WBC 7.0   < > 8.8 11.4* 10.0  --  10.4  NEUTROABS 4.9  --  7.1  --  7.5  --   --   HGB 11.4*   < > 11.5* 11.1* 11.2* 11.6* 10.9*  HCT 36.7   < > 37.5 35.5* 34.9* 34.0* 33.8*  MCV 98.4   < > 103.3* 99.2 98.6  --  97.4  PLT 201   < > 203 177 181  --  174   < > = values in this interval not displayed.    Medications:     aspirin  81 mg Oral Daily   calcium acetate  667 mg Oral TID WC   Chlorhexidine Gluconate Cloth  6 each Topical Daily   insulin aspart  0-20 Units Subcutaneous TID WC   insulin aspart  0-5 Units Subcutaneous QHS   mouth rinse  15 mL Mouth Rinse BID   midodrine  5 mg Oral TID WC   pantoprazole (PROTONIX) IV  40 mg Intravenous QHS      Gean Quint, MD [facility] 06/23/2021, 9:15 AM

## 2021-06-23 NOTE — Progress Notes (Signed)
[place] Progress Note Patient Name: [name] DOB: 04/04/1966 MRN: [id number]   Date of Service  06/23/2021  HPI/Events of Note  V /Q scan reported as probably negative but radiologist indicated sub-optimal study and suggested bilateral lower extremity dopplers which were actually done 06/20/21, patient is on Heparin gtt.  eICU Interventions  Continue Heparin gtt, no new intervention.        [name] Keron Neenan 06/23/2021, 9:54 PM

## 2021-06-23 NOTE — Consult Note (Addendum)
Advanced Heart Failure Team Consult Note   Primary Physician: Noreene Larsson, NP PCP-Cardiologist:  None  Reason for Consultation: Shock/RV Failure   HPI:    [name] is seen today for evaluation of Shock/RV Failure at the request of Dr Harrington Challenger.    [name] is 55 year old with a history of RV failure, CAD,COPD, ESRD on iHD , lymphedema, cellulitis and DMII. Wheel chair bound due to lymphedema.   In 2018 admitted with septic shock requiring pressors. ECHO showed EF 60-65% with severely dilated RV. Had [place] with high cardiac output felt to be in the setting for AVF.   Admitted to [facility] 06/19/21 with cellulitis. Given IV antibiotics and discharged on po antibiotics 2021/07/14 because her father died.     Returned to [facility] 2021-07-14 with AMS, septic shock, and a/c respiratory failure.WBC 11.4, prcalcitonin 5.9, and lactic acid 7.6. Blood CX obtained. Transferred to [facility] for shock management.  Given IV fluids, doxycycline, cefepime, and flagyl. Started on norepi  and neo. Echo 06/22/21 LV 60-65%, RV severely enlarged but unchanged from 2018.   Improved today. Off norepi weaning neo. Started on midodrine started. CO-OX 69%.  [place] 2018 RA = 11 RV = 76/13 PA = 76/24 (45) PCW = 19 Fick cardiac output/index = 8.7/3.7 PVR = 3.0 WU Ao sat = 99% PA sat = 75%, 75% High SVC sat = 71%  Assessment: 1. Moderate PAH with high cardiac output--> suspect in the setting of AVF 2. Relative well compensated left-sided pressures in setting of HD   Review of Systems: [y] = yes, _0  = no   General: Weight gain _1 ; Weight loss _2 ; Anorexia _3 ; Fatigue [ Y]; Fever _4 ; Chills _5 ; Weakness [ Y]  Cardiac: Chest pain/pressure _6 ; Resting SOB _7 ; Exertional SOB [Y  ]; Orthopnea _8 ; Pedal Edema _9 ; Palpitations _10 ; Syncope _11 ; Presyncope _12 ; Paroxysmal nocturnal dyspnea_13   Pulmonary: Cough _14 ; Wheezing_15 ; Hemoptysis_16 ; Sputum _17 ; Snoring _18   GI: Vomiting_19 ; Dysphagia_20 ; Melena_21 ; Hematochezia [  ]; Heartburn_22 ; Abdominal pain _23 ; Constipation _24 ; Diarrhea _25 ; BRBPR _26   GU: Hematuria_27 ; Dysuria _28 ; Nocturia_29   Vascular: Pain in legs with walking [ Y ]; Pain in feet with lying flat _30 ; Non-healing sores _31 ; Stroke _32 ; TIA _33 ; Slurred speech _34 ;  Neuro: Headaches_35 ; Vertigo_36 ; Seizures_37 ; Paresthesias_38 ;Blurred vision _39 ; Diplopia _40 ; Vision changes _41   Ortho/Skin: Arthritis _42 ; Joint pain [ Y]; Muscle pain _43 ; Joint swelling _44 ; Back Pain [ Y]; Rash _45   Psych: Depression_46 ; Anxiety_47   Heme: Bleeding problems _48 ; Clotting disorders _49 ; Anemia _50   Endocrine: Diabetes [ Y]; Thyroid dysfunction_51   Home Medications Prior to Admission medications   Medication Sig Start Date End Date Taking? Authorizing Provider  acetaminophen (TYLENOL) 325 MG tablet Take 2 tablets (650 mg total) by mouth every 4 (four) hours as needed for mild pain (or Fever >/= 101). 07/04/20  Yes Emokpae, Courage, MD  calcium acetate (PHOSLO) 667 MG capsule Take 1,334 mg by mouth 3 (three) times daily. 05/27/21  Yes [provider]  calcium carbonate (TUMS - DOSED IN MG ELEMENTAL CALCIUM) 500 MG chewable tablet Chew 2 tablets by mouth 2 (two) times daily.   Yes [provider]  cephALEXin (KEFLEX) 500 MG  capsule Take 1 capsule (500 mg total) by mouth 2 (two) times daily. Try to take this every 12 hours. On dialysis days, take the first dose after dialysis and the second at bedtime. 06/21/21  Yes Tat, Shanon Brow, MD  ibuprofen (ADVIL) 200 MG tablet Take 400 mg by mouth every 6 (six) hours as needed for moderate pain.   Yes [provider]  lanthanum (FOSRENOL) 500 MG chewable tablet Chew 1,000-1,500 mg by mouth 5 (five) times daily. 1500 mg by mouth three times a day with meals and 1000 mg by mouth twice a day with snacks   Yes [provider]  loperamide (IMODIUM) 2 MG capsule Take 2-4 mg by mouth as needed for diarrhea or loose stools.    Yes [provider]   multivitamin (RENA-VIT) TABS tablet Take 1 tablet by mouth daily. 05/30/18  Yes [provider]  Oxycodone HCl 10 MG TABS Take 1 tablet (10 mg total) by mouth 4 (four) times daily as needed (pain). 06/21/21  Yes Tat, Shanon Brow, MD  Sennosides (EX-LAX PO) Take 2 tablets by mouth daily as needed (constipation).   Yes [provider]  clindamycin (CLEOCIN) 300 MG capsule Take 1 capsule (300 mg total) by mouth 3 (three) times daily. 06/21/21   Orson Eva, MD  lactulose (CHRONULAC) 10 GM/15ML solution Take 45 mLs (30 g total) by mouth 2 (two) times daily. For constipation Patient not taking: Reported on 06/21/2021 04/30/19   Rai, Vernelle Emerald, MD  insulin lispro (HUMALOG) 100 UNIT/ML injection Inject 60 Units into the skin 2 (two) times daily.    03/08/12  [provider]    Past Medical History: Past Medical History:  Diagnosis Date   Allergy    Phreesia 11/04/2020   Anemia, unspecified    Anxiety    Arthritis    Phreesia 11/04/2020   Asthma    as a child   Cancer ([place])    thyroid   Cellulitis    CHF (congestive heart failure) ([place])    Chiari malformation    s/p surgery   Chiari malformation    Chronic kidney disease    Phreesia 11/04/2020   Chronic kidney disease, stage 5 (HCC)    Chronic obstructive pulmonary disease, unspecified ([place])    Chronic pain    Complication of anesthesia 11/28/15   Resp arrest after  conscious  sedation   Compression of brain (HCC)    Constipation    COPD (chronic obstructive pulmonary disease) ([place])    Phreesia 11/04/2020   Coronary artery disease    40-50% mid LAD 04/29/09, Medical tx. (Dr. Gwenlyn Found)   Diabetes mellitus    Type II- reports being off all medication d/t it being controlled   Diabetes mellitus without complication ([place])    Phreesia 11/04/2020   Essential (primary) hypertension    Fibromyalgia    Fibromyalgia    History of blood transfusion    hemorrage duinrg pregancy   Hx of echocardiogram 10/2011   EF 55-60%    Hypercholesterolemia    Hyperlipidemia, unspecified    Hypertension    Hypertensive chronic kidney disease with stage 1 through stage 4 chronic kidney disease, or unspecified chronic kidney disease    Hypertensive chronic kidney disease with stage 5 chronic kidney disease or end stage renal disease (HCC)    Lymph edema    Lymphedema, not elsewhere classified    Morbid (severe) obesity due to excess calories (HCC)    Obesity hypoventilation syndrome ([place])    On  home oxygen therapy    "2L; 24/7" (12/21/2018)   Peritonitis, unspecified ([place])    Pneumonia    in past   Pulmonary hypertension ([place])    Renal disorder    M/W/F [facility] Pt started dialysis in Dec.2016   S/P colonoscopy 05/26/2007   Dr. Laural Golden sigmoid diverticulosis random biopsies benign   S/P endoscopy 05/01/2009   Dr. Penelope Coop pill-induced esophageal ulcerations distal to midesophagus, 2 small ulcers in the antrum of the stomach   Shortness of breath dyspnea    with any exertion or if heart rate  is irregular while on dialysis   Sleep apnea    reports that she no longer needs CPAP due to weight loss   Sleep apnea, unspecified    Type 2 diabetes mellitus with diabetic nephropathy ([place])    Type 2 diabetes mellitus with hyperglycemia ([place])     Past Surgical History: Past Surgical History:  Procedure Laterality Date   .Hemodialysis catheter Right 11/28/2015   A/V FISTULAGRAM N/A 07/26/2017   Procedure: A/V Fistulagram - Left Arm;  Surgeon: Serafina Mitchell, MD;  Location: [place] [facility];  Service: Cardiovascular;  Laterality: N/A;   ABDOMINAL HYSTERECTOMY     total hysterectomy   ADENOIDECTOMY     AV FISTULA PLACEMENT Left 03/15/2016   Procedure: CREATION OF LEFT ARM ARTERIOVENOUS (AV) FISTULA  ;  Surgeon: Rosetta Posner, MD;  Location: [place];  Service: Vascular;  Laterality: Left;   CAPD INSERTION N/A 08/30/2017   Procedure: LAPAROSCOPIC INSERTION CONTINUOUS AMBULATORY PERITONEAL DIALYSIS  (CAPD) CATHETER;  Surgeon:  Clovis Riley, MD;  Location: [place];  Service: General;  Laterality: N/A;   CESAREAN SECTION      x 2   CESAREAN SECTION N/A    Phreesia 11/04/2020   CRANIECTOMY SUBOCCIPITAL W/ CERVICAL LAMINECTOMY / CHIARI     FISTULA SUPERFICIALIZATION Left 05/10/2016   Procedure: Left Arm FISTULA SUPERFICIALIZATION;  Surgeon: Rosetta Posner, MD;  Location: [place];  Service: Vascular;  Laterality: Left;   IR GENERIC HISTORICAL  08/14/2016   IR REMOVAL TUN ACCESS W/ PORT W/O FL MOD SED 08/14/2016 Arne Cleveland, MD [facility]-INTERV RAD   IR SINUS/FIST TUBE CHK-NON GI  01/01/2019   IR US GUIDE BX ASP/DRAIN  12/28/2018   MINOR REMOVAL OF PERITONEAL DIALYSIS CATHETER N/A 04/26/2019   Procedure: REMOVAL OF INFECTED PERITONEAL DIALYSIS CATHETER;  Surgeon: Erroll Luna, MD;  Location: [place];  Service: General;  Laterality: N/A;   PERIPHERAL VASCULAR BALLOON ANGIOPLASTY Left 10/18/2019   Procedure: PERIPHERAL VASCULAR BALLOON ANGIOPLASTY;  Surgeon: Marty Heck, MD;  Location: [place] [facility];  Service: Cardiovascular;  Laterality: Left;  arm fistula   PERIPHERAL VASCULAR CATHETERIZATION Left 11/25/2016   Procedure: A/V Fistulagram;  Surgeon: Conrad Grand Lake, MD;  Location: [place] [facility];  Service: Cardiovascular;  Laterality: Left;  arm   PORTACATH PLACEMENT  07/05/2012   Procedure: INSERTION PORT-A-CATH;  Surgeon: Donato Heinz, MD;  Location: [facility];  Service: General;  Laterality: Left;  subclavian   portacath removal     RIGHT HEART CATH N/A 08/04/2017   Procedure: RIGHT HEART CATH;  Surgeon: Jolaine Artist, MD;  Location: [place] [facility];  Service: Cardiovascular;  Laterality: N/A;   THYROIDECTOMY, PARTIAL     TONSILLECTOMY      Family History: Family History  Problem Relation Age of Onset   Colon cancer Mother 37   Stroke Mother 67   Coronary artery disease Mother  Social History: Social History   Socioeconomic History   Marital status: Married    Spouse name: Not on file    Number of children: 3   Years of education: Not on file   Highest education level: Not on file  Occupational History   Not on file  Tobacco Use   Smoking status: Never   Smokeless tobacco: Never  Vaping Use   Vaping Use: Never used  Substance and Sexual Activity   Alcohol use: No    Alcohol/week: 0.0 standard drinks   Drug use: No   Sexual activity: Not Currently  Other Topics Concern   Not on file  Social History Narrative   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 Tongue swells   Pneumococcal Vaccines Swelling and Other (See Comments)    Turns skin black, and bodily swelling   Vancomycin Nausea And Vomiting and Other (See Comments)    Infusion "made me feel like I was dying" had to be readmitted to hospital    Objective:    Vital Signs:   Temp:  [97.6 F (36.4 C)-97.8 F (36.6 C)] 97.7 F (36.5 C) (07/05 0743) Pulse Rate:  [68-120] 96 (07/05 0845) Resp:  [14-30] 16 (07/05 0845) BP: (89-127)/(37-103) 120/66 (07/05 0845) SpO2:  [93 %-98 %] 95 % (07/05 0845) Last BM Date:  (PTA)  Weight change: Filed Weights   06/21/21 2123 06/22/21 0100  Weight: 135 kg 130.1 kg    Intake/Output:   Intake/Output Summary (Last 24 hours) at 06/23/2021 1101 Last data filed at 06/23/2021 0600 Gross per 24 hour  Intake 3602.03 ml  Output --  Net 3602.03 ml      Physical Exam    General:  No resp difficulty HEENT: normal Neck: supple. JVP 5-6 d. Carotids 2+ bilat; no bruits. No lymphadenopathy or thyromegaly appreciated. Cor: PMI nondisplaced. Regular rate & rhythm. No rubs, gallops or  murmurs. Lungs: clear Abdomen: obese, soft, nontender, nondistended. No hepatosplenomegaly. No bruits or masses. Good bowel sounds. Extremities: no cyanosis, clubbing, rash, edema. RUE AVF  Neuro: alert & orientedx3, cranial nerves grossly intact. moves all 4 extremities w/o difficulty. Affect pleasant Skin: Rash under pannus    Telemetry  SR 70-90s   EKG   Junctional Tach 130 bpm    Labs   Basic Metabolic Panel: Recent Labs  Lab 06/20/21 0911 06/21/21 2254 06/22/21 0454 06/22/21 0934 06/22/21 1400 06/22/21 1414 06/23/21 0153  NA 133* 132*  --  133* 133* 131* 131*  K 3.6 4.8  --  5.0 4.8 4.7 5.1  CL 91* 93*  --  97* 97*  --  95*  CO2 27 17*  --  21* 23  --  22  GLUCOSE 119* 152*  --  142* 97  --  79  BUN 26* 44*  --  44* 46*  --  52*  CREATININE 6.14* 8.45* 8.60* 8.92* 8.39*  --  9.23*  CALCIUM 6.8* 7.4*  --  7.6* 7.3*  --  7.7*    Liver Function Tests: Recent Labs  Lab 06/19/21 1815 06/21/21 2254 06/22/21 0934 06/23/21 0153  AST 15 546* 1,893* 1,508*  ALT 14 109* 97* 32  ALKPHOS 327* 378* 368* 374*  BILITOT 0.5 0.8 0.6 0.8  PROT 6.6 6.3* 5.8* 5.9*  ALBUMIN 3.3* 3.0* 2.8* 2.9*   No results for input(s): LIPASE, AMYLASE in the last 168 hours. No results for input(s): AMMONIA in the last 168 hours.  CBC: Recent Labs  Lab 06/19/21 1225 06/20/21 0610 06/20/21 0911 06/21/21 2130 06/22/21 0454 06/22/21 0934 06/22/21 1414 06/23/21 0153  WBC 7.0   < > 7.2 8.8 11.4* 10.0  --  10.4  NEUTROABS 4.9  --   --  7.1  --  7.5  --   --   HGB 11.4*   < > 11.7* 11.5* 11.1* 11.2* 11.6* 10.9*  HCT 36.7   < > 36.8 37.5 35.5* 34.9* 34.0* 33.8*  MCV 98.4   < > 101.4* 103.3* 99.2 98.6  --  97.4  PLT 201   < > 179 203 177 181  --  174   < > = values in this interval not displayed.    Cardiac Enzymes: Recent Labs  Lab 06/22/21 1400  CKTOTAL 120    BNP: BNP (last 3 results) Recent Labs    06/30/20 0328 06/22/21 1400  BNP 85.0 590.8*    ProBNP (last 3  results) No results for input(s): PROBNP in the last 8760 hours.   CBG: Recent Labs  Lab 06/22/21 1501 06/22/21 2118 06/23/21 0741 06/23/21 0820 06/23/21 0902  GLUCAP 75 67* 59* 55* 95    Coagulation Studies: Recent Labs    06/23/21 0153  LABPROT 21.4*  INR 1.9*     Imaging   No results found.   Medications:     Current Medications:  aspirin  81 mg Oral Daily   calcium acetate  667 mg Oral TID WC   Chlorhexidine Gluconate Cloth  6 each Topical Daily   mouth rinse  15 mL Mouth Rinse BID   midodrine  5 mg Oral TID WC   pantoprazole (PROTONIX) IV  40 mg Intravenous QHS    Infusions:  sodium chloride     sodium chloride     albumin human     DAPTOmycin (CUBICIN)  IV     heparin 1,450 Units/hr (06/23/21 0741)   norepinephrine (LEVOPHED) Adult infusion Stopped (06/22/21 1049)   phenylephrine ([name]-SYNEPHRINE) Adult infusion 35 mcg/min (06/23/21 0814)      Assessment/Plan  Septic Shock -->cellulitis thighs  -Lactic Acid 7.6  - Procalcitonin 7.2  -Bld Cx- NGTD  - On antibiotics per daptomycin  -On Norep stopped earlier this month and coming off vasopressin.  - Now on midodrine.   2. ESRD -Nephrology following. Started HD 3 days a week.   3. DMII On SSI  4. Chronic RV Failure -Echo unchanged from 2018--> Echo 06/22/21 LV 60-65%, RV severely enlarged. -[place] 2018 high cardiac output felt to be from AVF.  - CO-OX 69%. - Volume status managed with HD.   Little to offer from HF standpoint.    Length of Stay: 2  Darrick Grinder, NP  06/23/2021, 11:01 AM  Advanced Heart Failure Team Pager [phone] (M-F; 7a - 5p)  Please contact [facility] for night-coverage after hours (4p -7a ) and weekends on [email or web address]  Patient seen with NP, agree with the above note.   History as outlined above.  Baseline wheelchair-bound.  She was admitted 7/1 for cellulitis, discharged 7/3 and re-admitted 7/4 with lethargy and hypotension, thought to be septic.  She is now on  broad spectrum abx.    She has history of RV failure/pulmonary hypertension, concern in past that this is due to high output from to AV fistula (based on prior RHC).  Echo this admission showed EF 60-65%, moderate LVH, mod-severe RV dysfunction with severe RVE, PASP 69, mod-severe TR, dilated IVC.  This is very similar to prior echo from 2018 with RV looking mildly worse.    She has not had HD for several days now.   General: NAD Neck: JVP 14-16, no thyromegaly or thyroid nodule.  Lungs: Clear to auscultation bilaterally with normal respiratory effort. CV: Nondisplaced PMI.  Heart regular S1/S2, +S3, 1/6 HSM LLSB.  1+ edema to knees.   Abdomen: Soft, nontender, no hepatosplenomegaly, no distention.  Skin: Intact without lesions or rashes.  Neurologic: Alert and oriented x 3.  Psych: Normal affect. Extremities: No clubbing or cyanosis. Large left arm AV fistula.  HEENT: Normal.     1. Acute on chronic diastolic CHF with prominent RV failure: Echo as above with EF 60-65%, moderate LVH, mod-severe RV dysfunction with severe RVE, PASP 69, mod-severe TR, dilated IVC. This is very similar to prior echo from 2018 with RV looking mildly worse.  She has not had HD for several days and is quite volume overloaded on exam.  - She will need dialysis today => she is coming off pressors, so hopefully can tolerate iHD, but will need CVVH if we have a hard time pulling off fluid.  2. Pulmonary hypertension: RHC in 8/18 showed severe pulmonary hypertension with relatively low PVR (3.0 WU) and high cardiac output. This was thought to be high output PH primarily in setting of excessive flow through the large left arm AVF. She was not thought to be a candidate for selective pulmonary vasodilators.  PA systolic pressure by echo is similar this admission (though mildly lower) to the echo from 2018.  I suspect that the situation is the same, most likely primarily high output pulmonary hypertension and no role for  selective pulmonary vasodilators.   - Fluid removal via HD as above.  - Would consider ligation of AV fistula, probably when stabilized as outpatient.  3. Shock: Suspect primarily septic shock from cellulitis but RV failure likely plays a significant role as well.  - She is titrating off phenylephrine now, should be able to stop.  If she needs further pressor treatment, would rather have her on norepinephrine.  - Continue midodrine 5 mg tid.  4. ID: Cellulitis.  Continue daptomycin per CCM.  5. Rhythm: Patient currently in NSR, has had episodes that look like AVNRT.  No definite fibrillation or flutter that I have seen.  - Will review with EP, but do not think I see anything that absolutely has to be anticoagulated.  6. Renal: ESRD.  As above, need to resume HD today, needs significant volume off.   Loralie Champagne 06/23/2021 1:05 PM

## 2021-06-23 NOTE — Progress Notes (Signed)
ANTICOAGULATION CONSULT NOTE - Follow-Up  Pharmacy Consult for Heparin  Indication: chest pain/ACS, elevated troponin, r/o PE/DVT  Patient Measurements: Height: _0  (165.1 cm) Weight: 130.1 kg (286 lb 13.1 oz) IBW/kg (Calculated) : 57 Heparin Dosing Weight: 90 kg  Vital Signs: Temp: 97.8 F (36.6 C) (07/05 0500) Temp Source: Oral (07/05 0500) BP: 107/67 (07/05 0600) Pulse Rate: 95 (07/05 0600)  Labs: Recent Labs    06/21/21 2254 06/22/21 0454 06/22/21 0934 06/22/21 1400 06/22/21 1407 06/22/21 1414 06/23/21 0153 06/23/21 0605  HGB  --  11.1* 11.2*  --   --  11.6* 10.9*  --   HCT  --  35.5* 34.9*  --   --  34.0* 33.8*  --   PLT  --  177 181  --   --   --  174  --   LABPROT  --   --   --   --   --   --  21.4*  --   INR  --   --   --   --   --   --  1.9*  --   HEPARINUNFRC  --   --   --  0.15*  --   --   --  0.14*  CREATININE 8.45* 8.60* 8.92* 8.39*  --   --  9.23*  --   CKTOTAL  --   --   --  120  --   --   --   --   TROPONINIHS 326*  --  3,500*  --  3,765*  --   --   --      Estimated Creatinine Clearance: 9.5 mL/min (A) (by C-G formula based on SCr of 9.23 mg/dL (H)).  Assessment: 55 y/o F transfer from [facility] with r/o sepsis and hypotension requiring vasopressor support. Starting heparin for elevated troponin, also with concern for PE/DVT. Pharmacy consulted to dose.   Heparin level this afternoon is SUBtherapeutic (HL 0.14, goal of 0.3-0.7). No bleeding or issues noted per RN  Goal of Therapy:  Heparin level 0.3-0.7 units/ml Monitor platelets by anticoagulation protocol: Yes   Plan:  - Heparin 2500 units bolus x 1 - Increase Heparin to 1450 units/hr (14.5 ml/hr) - Will continue to monitor for any signs/symptoms of bleeding and will follow up with heparin level in 8 hours   Thank you for allowing pharmacy to be a part of this patient's care.  Alanda Slim, PharmD, [facility] Clinical Pharmacist Please see AMION for all Pharmacists' Contact Phone  Numbers 06/23/2021, 7:14 AM

## 2021-06-23 NOTE — Progress Notes (Signed)
Hypoglycemic Event  CBG: 52  Treatment: 4 oz juice/soda  Symptoms:  Tried  Follow-up CBG: Time:  CBG Result:55  Possible Reasons for Event: Inadequate meal intake  Comments/MD notified:Smith    Truman Hayward, Anabel Bene

## 2021-06-23 NOTE — Progress Notes (Signed)
Pharmacy Antibiotic Note  [name] is a 55 y.o. female admitted on 06/21/2021 with cellulitis.  Pharmacy has been consulted for Daptomycin dosing. CrCL 9 ml/min, TBW 130.2 kg, adj BW 86.2 kg  Plan: D/c Cefepime D/c Doxy D/c flagyl Start Daptomycin 500 mg (6 mg/kg x Adj BW) IV q48hr  Height: _0  (165.1 cm) Weight: 130.1 kg (286 lb 13.1 oz) IBW/kg (Calculated) : 57  Temp (24hrs), Avg:97.7 F (36.5 C), Min:97.6 F (36.4 C), Max:97.8 F (36.6 C)  Recent Labs  Lab 06/19/21 1446 06/20/21 0610 06/20/21 0911 06/21/21 2130 06/21/21 2254 06/21/21 2258 06/22/21 0454 06/22/21 0934 06/22/21 1400 06/23/21 0153  WBC  --    < > 7.2 8.8  --   --  11.4* 10.0  --  10.4  CREATININE  --    < > 6.14*  --  8.45*  --  8.60* 8.92* 8.39* 9.23*  LATICACIDVEN 1.4  --   --  9.5*  --  7.6*  --  2.4*  --   --    < > = values in this interval not displayed.    Estimated Creatinine Clearance: 9.5 mL/min (A) (by C-G formula based on SCr of 9.23 mg/dL (H)).    Allergies  Allergen Reactions   Contrast Media [Iodinated Diagnostic Agents] Anaphylaxis, Hives, Swelling and Other (See Comments)    Dye for cardiac cath. Tongue swells   Pneumococcal Vaccines Swelling and Other (See Comments)    Turns skin black, and bodily swelling   Vancomycin Nausea And Vomiting and Other (See Comments)    Infusion "made me feel like I was dying" had to be readmitted to hospital    Thank you for allowing pharmacy to be a part of this patient's care.  Alanda Slim, PharmD, [facility] Clinical Pharmacist Please see AMION for all Pharmacists' Contact Phone Numbers 06/23/2021, 8:38 AM

## 2021-06-23 NOTE — Progress Notes (Signed)
ANTICOAGULATION CONSULT NOTE - Follow-Up  Pharmacy Consult for Heparin  Indication: chest pain/ACS, elevated troponin, r/o PE/DVT  Patient Measurements: Height: _0  (165.1 cm) Weight: 131.1 kg (289 lb 0.4 oz) IBW/kg (Calculated) : 57 Heparin Dosing Weight: 90 kg  Vital Signs: Temp: 98.1 F (36.7 C) (07/05 1630) Temp Source: Oral (07/05 1630) BP: 111/67 (07/05 1715) Pulse Rate: 78 (07/05 1715)  Labs: Recent Labs    06/22/21 0454 06/22/21 0934 06/22/21 1400 06/22/21 1407 06/22/21 1414 06/23/21 0153 06/23/21 0605 06/23/21 0954 06/23/21 1626  HGB 11.1* 11.2*  --   --  11.6* 10.9*  --   --   --   HCT 35.5* 34.9*  --   --  34.0* 33.8*  --   --   --   PLT 177 181  --   --   --  174  --   --   --   LABPROT  --   --   --   --   --  21.4*  --   --   --   INR  --   --   --   --   --  1.9*  --   --   --   HEPARINUNFRC  --   --  0.15*  --   --   --  0.14*  --  0.12*  CREATININE 8.60* 8.92* 8.39*  --   --  9.23*  --  9.05*  --   CKTOTAL  --   --  120  --   --   --   --   --   --   TROPONINIHS 2,161* 3,500*  --  3,765*  --   --   --   --   --      Estimated Creatinine Clearance: 9.7 mL/min (A) (by C-G formula based on SCr of 9.05 mg/dL (H)).  Assessment: 55 y/o F transfer from [facility] with r/o sepsis and hypotension requiring vasopressor support. Starting heparin for elevated troponin, also with concern for PE/DVT. Pharmacy consulted to dose.   Heparin level of 0.12 on heparin 1450 units/hr is subtherapeutic and heparin level decreased after rate increased. Level drawn appropriately. Per RN no issues with IV infusion or access. No bleeding noted.   Goal of Therapy:  Heparin level 0.3-0.7 units/ml Monitor platelets by anticoagulation protocol: Yes   Plan:  Heparin 2500 units x1 bolus  Increase heparin to 1700 units/hr Check 8 hr heparin level Monitor heparin level, CBC and s/s of bleeding daily   Thank you for allowing pharmacy to be a part of this patient's care.  Cristela Felt, PharmD Clinical Pharmacist  06/23/2021, 5:19 PM

## 2021-06-23 NOTE — Progress Notes (Signed)
Inpatient Diabetes Program Recommendations  AACE/ADA: New Consensus Statement on Inpatient Glycemic Control (2015)  Target Ranges:  Prepandial:   less than 140 mg/dL      Peak postprandial:   less than 180 mg/dL (1-2 hours)      Critically ill patients:  140 - 180 mg/dL   Lab Results  Component Value Date   GLUCAP 95 06/23/2021   HGBA1C 6.9 (H) 06/22/2021    Review of Glycemic Control Results for [name], [name] (MRN [id number]) as of 06/23/2021 10:44  Ref. Range 06/22/2021 21:18 06/23/2021 07:41 06/23/2021 08:20 06/23/2021 09:02  Glucose-Capillary Latest Ref Range: 70 - 99 mg/dL 67 (L) 59 (L) 55 (L) 95   Diabetes history: Type 2 Dm Outpatient Diabetes medications: none Current orders for Inpatient glycemic control: Novolog 0-20 units TID & HS  Inpatient Diabetes Program Recommendations:    Noted hypoglycemia this AM of 55 mg/dL. Given renal status would recommend decreasing correction to Novolog 0-6 units TID.   Thanks, Bronson Curb, MSN, RNC-OB Diabetes Coordinator [phone] (8a-5p)

## 2021-06-24 ENCOUNTER — Inpatient Hospital Stay (HOSPITAL_COMMUNITY): Payer: 59

## 2021-06-24 DIAGNOSIS — R6521 Severe sepsis with septic shock: Secondary | ICD-10-CM | POA: Diagnosis not present

## 2021-06-24 DIAGNOSIS — A419 Sepsis, unspecified organism: Secondary | ICD-10-CM | POA: Diagnosis not present

## 2021-06-24 LAB — PROCALCITONIN: Procalcitonin: 9.18 ng/mL

## 2021-06-24 LAB — RENAL FUNCTION PANEL
Albumin: 2.8 g/dL — ABNORMAL LOW (ref 3.5–5.0)
Anion gap: 14 (ref 5–15)
BUN: 42 mg/dL — ABNORMAL HIGH (ref 6–20)
CO2: 22 mmol/L (ref 22–32)
Calcium: 7.5 mg/dL — ABNORMAL LOW (ref 8.9–10.3)
Chloride: 96 mmol/L — ABNORMAL LOW (ref 98–111)
Creatinine, Ser: 7.51 mg/dL — ABNORMAL HIGH (ref 0.44–1.00)
GFR, Estimated: 6 mL/min — ABNORMAL LOW (ref >60)
Glucose, Bld: 173 mg/dL — ABNORMAL HIGH (ref 70–99)
Phosphorus: 6 mg/dL — ABNORMAL HIGH (ref 2.5–4.6)
Potassium: 3.8 mmol/L (ref 3.5–5.1)
Sodium: 132 mmol/L — ABNORMAL LOW (ref 135–145)

## 2021-06-24 LAB — HEPATIC FUNCTION PANEL
ALT: 14 U/L (ref 0–44)
AST: 899 U/L — ABNORMAL HIGH (ref 15–41)
Albumin: 2.9 g/dL — ABNORMAL LOW (ref 3.5–5.0)
Alkaline Phosphatase: 388 U/L — ABNORMAL HIGH (ref 38–126)
Bilirubin, Direct: 0.9 mg/dL — ABNORMAL HIGH (ref 0.0–0.2)
Indirect Bilirubin: 1 mg/dL — ABNORMAL HIGH (ref 0.3–0.9)
Total Bilirubin: 1.9 mg/dL — ABNORMAL HIGH (ref 0.3–1.2)
Total Protein: 5.7 g/dL — ABNORMAL LOW (ref 6.5–8.1)

## 2021-06-24 LAB — CULTURE, BLOOD (ROUTINE X 2)
Culture: NO GROWTH
Special Requests: ADEQUATE

## 2021-06-24 LAB — GLUCOSE, CAPILLARY
Glucose-Capillary: 120 mg/dL — ABNORMAL HIGH (ref 70–99)
Glucose-Capillary: 115 mg/dL — ABNORMAL HIGH (ref 70–99)
Glucose-Capillary: 125 mg/dL — ABNORMAL HIGH (ref 70–99)
Glucose-Capillary: 114 mg/dL — ABNORMAL HIGH (ref 70–99)
Glucose-Capillary: 124 mg/dL — ABNORMAL HIGH (ref 70–99)

## 2021-06-24 LAB — CBC
HCT: 31.4 % — ABNORMAL LOW (ref 36.0–46.0)
Hemoglobin: 10.3 g/dL — ABNORMAL LOW (ref 12.0–15.0)
MCH: 31.8 pg (ref 26.0–34.0)
MCHC: 32.8 g/dL (ref 30.0–36.0)
MCV: 96.9 fL (ref 80.0–100.0)
Platelets: 214 10*3/uL (ref 150–400)
RBC: 3.24 MIL/uL — ABNORMAL LOW (ref 3.87–5.11)
RDW: 14.7 % (ref 11.5–15.5)
WBC: 9 10*3/uL (ref 4.0–10.5)
nRBC: 0.9 % — ABNORMAL HIGH (ref 0.0–0.2)

## 2021-06-24 LAB — MAGNESIUM: Magnesium: 2 mg/dL (ref 1.7–2.4)

## 2021-06-24 LAB — HEPATITIS B SURFACE ANTIGEN: Hepatitis B Surface Ag: NONREACTIVE

## 2021-06-24 LAB — HEPARIN LEVEL (UNFRACTIONATED): Heparin Unfractionated: <0.1 IU/mL — ABNORMAL LOW (ref 0.30–0.70)

## 2021-06-24 MED ORDER — OXYCODONE HCL 5 MG PO TABS
5.0000 mg | ORAL_TABLET | ORAL | Status: DC | PRN
  Administered 2021-06-24 – 2021-07-09 (×32): 5 mg via ORAL
  Filled 2021-06-24 (×32): qty 1

## 2021-06-24 MED ORDER — HEPARIN SODIUM (PORCINE) 5000 UNIT/ML IJ SOLN
5000.0000 [IU] | Freq: Three times a day (TID) | INTRAMUSCULAR | Status: DC
  Administered 2021-06-24 – 2021-06-29 (×17): 5000 [IU] via SUBCUTANEOUS
  Filled 2021-06-24 (×19): qty 1

## 2021-06-24 NOTE — Progress Notes (Signed)
ANTICOAGULATION CONSULT NOTE - Follow-Up  Pharmacy Consult for Heparin  Indication: chest pain/ACS, elevated troponin, r/o PE/DVT  Patient Measurements: Height: _0  (165.1 cm) Weight: 130.1 kg (286 lb 13.1 oz) IBW/kg (Calculated) : 57 Heparin Dosing Weight: 90 kg  Vital Signs: Temp: 96.4 F (35.8 C) (07/05 1932) Temp Source: Axillary (07/05 1932) BP: 96/53 (07/06 0100) Pulse Rate: 78 (07/06 0100)  Labs: Recent Labs    06/22/21 0454 06/22/21 0934 06/22/21 0934 06/22/21 1400 06/22/21 1407 06/22/21 1414 06/23/21 0153 06/23/21 0605 06/23/21 0954 06/23/21 1626 06/23/21 1912 06/24/21 0200 06/24/21 0254  HGB 11.1* 11.2*  --   --   --    < > 10.9*  --   --   --  11.6*  --  10.3*  HCT 35.5* 34.9*  --   --   --    < > 33.8*  --   --   --  34.8*  --  31.4*  PLT 177 181  --   --   --   --  174  --   --   --  151  --  214  LABPROT  --   --   --   --   --   --  21.4*  --   --   --   --   --   --   INR  --   --   --   --   --   --  1.9*  --   --   --   --   --   --   HEPARINUNFRC  --   --    < > 0.15*  --   --   --  0.14*  --  0.12*  --  <0.10*  --   CREATININE 8.60* 8.92*  --  8.39*  --   --  9.23*  --  9.05*  --   --   --   --   CKTOTAL  --   --   --  120  --   --   --   --   --   --   --   --   --   TROPONINIHS 2,161* 3,500*  --   --  3,765*  --   --   --   --   --   --   --   --    < > = values in this interval not displayed.     Estimated Creatinine Clearance: 9.7 mL/min (A) (by C-G formula based on SCr of 9.05 mg/dL (H)).  Assessment: 55 y/o F transfer from [facility] with r/o sepsis and hypotension requiring vasopressor support. Starting heparin for elevated troponin, also with concern for PE/DVT. Pharmacy consulted to dose.   7/6 AM update:  Heparin level low No issues with bleeding or infusion per RN  Goal of Therapy:  Heparin level 0.3-0.7 units/ml Monitor platelets by anticoagulation protocol: Yes   Plan:  -Inc heparin to 1900 units/hr -1200 heparin level  Narda Bonds, PharmD, BCPS Clinical Pharmacist Phone: [phone]

## 2021-06-24 NOTE — Progress Notes (Addendum)
Advanced Heart Failure Rounding Note  PCP-Cardiologist: None   Subjective:    7/5 Off [name]. Tolerated HD.   Complaining of left leg/back pain.    Objective:   Weight Range: 130.1 kg Body mass index is 47.73 kg/m.   Vital Signs:   Temp:  [96.4 F (35.8 C)-98.9 F (37.2 C)] 97.8 F (36.6 C) (07/06 0809) Pulse Rate:  [68-120] 82 (07/06 0600) Resp:  [14-23] 18 (07/06 0600) BP: (73-175)/(26-132) 106/58 (07/06 0600) SpO2:  [63 %-100 %] 91 % (07/06 0600) Weight:  [130.1 kg-131.1 kg] 130.1 kg (07/05 1736) Last BM Date:  (PTA)  Weight change: Filed Weights   06/22/21 0100 06/23/21 1355 06/23/21 1736  Weight: 130.1 kg 131.1 kg 130.1 kg    Intake/Output:   Intake/Output Summary (Last 24 hours) at 06/24/2021 0827 Last data filed at 06/24/2021 0600 Gross per 24 hour  Intake 989.83 ml  Output 1000 ml  Net -10.17 ml      Physical Exam    General:  . No resp difficulty HEENT: Normal Neck: Supple. JVP 7-8 . Carotids 2+ bilat; no bruits. No lymphadenopathy or thyromegaly appreciated. Cor: PMI nondisplaced. Regular rate & rhythm. No rubs, gallops or murmurs. Lungs: Clear on 2 liters [place] Abdomen: Soft, nontender, nondistended. No hepatosplenomegaly. No bruits or masses. Good bowel sounds. Extremities: No cyanosis, clubbing, rash, edema. LUE AVF  Neuro: Alert & orientedx3, cranial nerves grossly intact. moves all 4 extremities w/o difficulty. Affect pleasant   Telemetry   SR - -->Tach 60-100s  Reviewed by EP not A flutter or A fib.   EKG    N/A  Labs    CBC Recent Labs    06/21/21 2130 06/22/21 0454 06/22/21 0934 06/22/21 1414 06/23/21 1912 06/24/21 0254  WBC 8.8   < > 10.0   < > 8.3 9.0  NEUTROABS 7.1  --  7.5  --   --   --   HGB 11.5*   < > 11.2*   < > 11.6* 10.3*  HCT 37.5   < > 34.9*   < > 34.8* 31.4*  MCV 103.3*   < > 98.6   < > 96.4 96.9  PLT 203   < > 181   < > 151 214   < > = values in this interval not displayed.   Basic Metabolic Panel Recent  Labs    06/23/21 0153 06/23/21 0954  NA 131* 134*  K 5.1 4.6  CL 95* 99  CO2 22 22  GLUCOSE 79 96  BUN 52* 55*  CREATININE 9.23* 9.05*  CALCIUM 7.7* 7.4*   Liver Function Tests Recent Labs    06/23/21 0153 06/24/21 0254  AST 1,508* 899*  ALT 32 14  ALKPHOS 374* 388*  BILITOT 0.8 1.9*  PROT 5.9* 5.7*  ALBUMIN 2.9* 2.9*   No results for input(s): LIPASE, AMYLASE in the last 72 hours. Cardiac Enzymes Recent Labs    06/22/21 1400  CKTOTAL 120    BNP: BNP (last 3 results) Recent Labs    06/30/20 0328 06/22/21 1400  BNP 85.0 590.8*    ProBNP (last 3 results) No results for input(s): PROBNP in the last 8760 hours.   D-Dimer Recent Labs    06/22/21 1400  DDIMER 18.87*   Hemoglobin A1C Recent Labs    06/22/21 0454  HGBA1C 6.9*   Fasting Lipid Panel No results for input(s): CHOL, HDL, LDLCALC, TRIG, CHOLHDL, LDLDIRECT in the last 72 hours. Thyroid Function Tests No results for input(s):  TSH, T4TOTAL, T3FREE, THYROIDAB in the last 72 hours.  Invalid input(s): FREET3  Other results:   Imaging    NM Pulmonary Perfusion  Result Date: 06/23/2021 CLINICAL DATA:  Respiratory failure, hypoxia and concern for pulmonary embolism. EXAM: NUCLEAR MEDICINE PERFUSION LUNG SCAN TECHNIQUE: Perfusion images were obtained in multiple projections after intravenous injection of radiopharmaceutical. Ventilation scans intentionally deferred if perfusion scan and chest x-ray adequate for interpretation during COVID 19 epidemic. RADIOPHARMACEUTICALS:  4.2 mCi Tc-74mMAA IV COMPARISON:  Chest x-ray same date. FINDINGS: Extensive soft tissue attenuation due to patient body habitus. Positioning appears nonstandard perhaps also due to patient body habitus. There is a photopenic area over the LEFT upper lobe that does not appear clearly wedge-shaped and does not correspond well on lateral projections which are limited by overlapping soft tissue. This could even be vascular based on  the appearance of the mediastinum and heart on the chest x-ray. Image labeled LAO appears more like a true AP and image labeled RPO appears more as a posterior projection. These images are relatively normal accounting for technical factors outlined above. IMPRESSION: Markedly limited evaluation with photopenic area particularly in the LEFT chest that is favored to represent artifact. Given the limited nature of the of the evaluation it is difficult to exclude the possibility of perfusion defect. Consider lower extremity venous sonogram for further assessment as clinically warranted to guide further management. These results will be called to the ordering clinician or representative by the Radiologist Assistant, and communication documented in the PACS or [employer] Electronically Signed   By: GZetta BillsM.D.   On: 06/23/2021 20:51   DG CHEST PORT 1 VIEW  Result Date: 06/23/2021 CLINICAL DATA:  History of V/Q scan. EXAM: PORTABLE CHEST 1 VIEW COMPARISON:  June 21, 2021. FINDINGS: Stable cardiomegaly with central pulmonary vascular congestion. No pneumothorax is noted. Mild left basilar atelectasis is noted. Mild right basilar atelectasis and pleural effusion is noted. Bony thorax is unremarkable. IMPRESSION: Bibasilar atelectasis is noted with probable right pleural effusion. Stable cardiomegaly with central pulmonary vascular congestion. Aortic Atherosclerosis (ICD10-I70.0). Electronically Signed   By: JMarijo ConceptionM.D.   On: 06/23/2021 15:29     Medications:     Scheduled Medications:  aspirin  81 mg Oral Daily   calcium acetate  667 mg Oral TID WC   Chlorhexidine Gluconate Cloth  6 each Topical Daily   mouth rinse  15 mL Mouth Rinse BID   midodrine  5 mg Oral TID WC   pantoprazole (PROTONIX) IV  40 mg Intravenous QHS    Infusions:  sodium chloride     sodium chloride     sodium chloride     sodium chloride     DAPTOmycin (CUBICIN)  IV Stopped (06/23/21 1309)   heparin 1,900  Units/hr (06/24/21 0600)   norepinephrine (LEVOPHED) Adult infusion 2 mcg/min (06/24/21 0654)   phenylephrine ([name]-SYNEPHRINE) Adult infusion Stopped (06/23/21 1404)    PRN Medications: sodium chloride, sodium chloride, alteplase, docusate sodium, heparin, lactulose, lidocaine (PF), lidocaine-prilocaine, ondansetron (ZOFRAN) IV, oxyCODONE, pentafluoroprop-tetrafluoroeth, polyethylene glycol    Assessment/Plan  Septic Shock -->cellulitis thighs -Lactic Acid 7.6 - Procalcitonin 9.2  -Bld Cx- NGTD - On antibiotics per daptomycin -Off norepi+ neo.  - Now on midodrine.   2. ESRD -Nephrology following. Started HD 3 days a week. Tolerated HD    3. DMII On SSI   4. Acute on Chronic RV Failure -Echo unchanged from 2018--> Echo 06/22/21 LV 60-65%, RV severely enlarged. -RHC  2018 high cardiac output felt to be from AVF. -Tolerated HD. Plan for another session today.    5. Pulmonary HTN  RHC in 8/18 showed severe pulmonary hypertension with relatively low PVR (3.0 WU) and high cardiac output. This was thought to be high output PH primarily in setting of excessive flow through the large left arm AVF. She was not thought to be a candidate for selective pulmonary vasodilators.  PA systolic pressure by echo is similar this admission (though mildly lower) to the echo from 2018.  I suspect that the situation is the same, most likely primarily high output pulmonary hypertension and no role for selective pulmonary vasodilators.   - Fluid removal via HD as above. - Would consider ligation of AV fistula, probably when stabilized as outpatient.  6. Rhythma Patient currently in NSR, has had episodes that look like AVNRT.  No definite fibrillation or flutter. Reviewed by EP. Does not need anticoagulation. Stop heparin drip.   Disposition: ? SNF  Length of Stay: [place], NP  06/24/2021, 8:27 AM  Advanced Heart Failure Team Pager [phone] (M-F; 7a - 5p)  Please contact [facility] for  night-coverage after hours (5p -7a ) and weekends on [email or web address]  Patient seen with NP, agree with the above note.   Stable, in NSR.  She tolerated HD yesterday.  She is now off norepinephrine with SBP in 100s.   General: NAD Neck: JVP 14 cm, no thyromegaly or thyroid nodule.  Lungs: Clear to auscultation bilaterally with normal respiratory effort. CV: Nondisplaced PMI.  Heart regular S1/S2, no S3/S4, no murmur.  No peripheral edema.   Abdomen: Soft, nontender, no hepatosplenomegaly, no distention.  Skin: Intact without lesions or rashes.  Neurologic: Alert and oriented x 3.  Psych: Normal affect. Extremities: No clubbing or cyanosis.  HEENT: Normal.   1. Acute on chronic diastolic CHF with prominent RV failure: Echo as above with EF 60-65%, moderate LVH, mod-severe RV dysfunction with severe RVE, PASP 69, mod-severe TR, dilated IVC. This is very similar to prior echo from 2018 with RV looking mildly worse.  She had HD yesterday but remains volume overloaded on exam.  She is off norepinephrine, on midodrine.  - She will need dialysis today for fluid removal.  2. Pulmonary hypertension: RHC in 8/18 showed severe pulmonary hypertension with relatively low PVR (3.0 WU) and high cardiac output. This was thought to be high output PH primarily in setting of excessive flow through the large left arm AVF. She was not thought to be a candidate for selective pulmonary vasodilators.  PA systolic pressure by echo is similar this admission (though mildly lower) to the echo from 2018.  I suspect that the situation is the same, most likely primarily high output pulmonary hypertension and no role for selective pulmonary vasodilators.   - Fluid removal via HD as above. - Would consider ligation of AV fistula, probably when stabilized as outpatient. 3. Shock: Suspect primarily septic shock from cellulitis but RV failure likely plays a significant role as well. Resolved, off norepinephrine.  - Continue midodrine 5  mg tid. 4. ID: Cellulitis.  Continue daptomycin per CCM. 5. Rhythm: Patient currently in NSR, has had episodes of what looks like AVNRT versus AVRT and possible atrial tachycardia.  No definite fibrillation or flutter that I have seen. - Reviewed with EP, do not think she needs long-term anticoagulation.  Stop heparin gtt, start [place] heparin prophylaxis.  - If BP remains stable, add low dose beta blockade.  6. Renal: ESRD.  As above, need to resume HD today, needs significant volume off.  Loralie Champagne 06/24/2021 1:34 PM

## 2021-06-24 NOTE — Evaluation (Signed)
Occupational Therapy Evaluation Patient Details Name: [name] MRN: [id number] DOB: 1966/06/30 Today's Date: 06/24/2021    History of Present Illness 55 y.o. female presenting from [place] 7/3 with lethargy, hypotension and elevated lactic acid after d/c from [facility] earlier that day. Admitted at [facility] several days ago with cellulitis, given IV antibiotics and d/c'd home secondary to passing of father. This admission patient found to have AMS, septic shock, acute respiratory failure, acute on chronic RV failure.  PMHX significant for obesity, DMII, COPD, CAD, lymphedema, ESRD on HD MWF and CHF.   Clinical Impression   PTA patient was living with her spouse and 55 y.o. granddaughter in a private residence with ramped entry. Patient reports ability to perform UB ADLs with Mod I from power wheelchair level and reports ability to take 3 steps from power chair to commode in bathroom for toileting tasks. Granddaughter assist BLE from EOB to bed surface secondary to lymphedema. Patient grossly wheelchair bound otherwise. Patient currently functioning below baseline requiring +3 assist for return to supine from EOB (able to get to EOB with Min guard and increased time/effort with use of bedrail) and inability to stand with 1 person assist. Will attempt with +2 assist at time of next treatment session. Patient also limited by deficits listed below including generalized weakness, BLE lymphedema (R<L), and generalized debility/deconditioning and would benefit from continued acute OT services in prep for safe d/c to next level of care with recommendation for SNF rehab.     Follow Up Recommendations  SNF;Supervision/Assistance - 24 hour    Equipment Recommendations  Other (comment) (Defer to next level of care.)    Recommendations for Other Services       Precautions / Restrictions Precautions Precautions: Fall Precaution Comments: Monitor SpO2 Restrictions Weight Bearing Restrictions: No      Mobility  Bed Mobility Overal bed mobility: Needs Assistance Bed Mobility: Supine to Sit;Sit to Supine     Supine to sit: Min guard Sit to supine: Max assist (+3 assist required for return to supine)   General bed mobility comments: Min guard +bed rail for supine to EOB. At EOB patient able to complete anterior scoots but unable to scoot hips posteriorly or laterally requiring +3 assist for return to supine with use of chuck pad.    Transfers Overall transfer level: Needs assistance               General transfer comment: Attempted sit to stand x several trials with +1 assist. Will likely require +2 to +3 assist.    Balance Overall balance assessment: Needs assistance Sitting-balance support: Single extremity supported;Bilateral upper extremity supported;Feet supported Sitting balance-Leahy Scale: Fair Sitting balance - Comments: Able to maintain static sitting balance at EOB with and without unilateral UE support.                                   ADL either performed or assessed with clinical judgement   ADL Overall ADL's : Needs assistance/impaired                     Lower Body Dressing: Total assistance Lower Body Dressing Details (indicate cue type and reason): Total A to don footwear in supine.               General ADL Comments: Patient greatly limited by BLE edema, generalized weakness, inability to stand.     Vision Patient  Visual Report: No change from baseline       Perception     Praxis      Pertinent Vitals/Pain Pain Assessment: 0-10 Pain Score: 6  Pain Location: R hip and low back Pain Descriptors / Indicators: Aching;Sore Pain Intervention(s): Limited activity within patient's tolerance;Monitored during session;Repositioned;Premedicated before session     Hand Dominance Right   Extremity/Trunk Assessment Upper Extremity Assessment Upper Extremity Assessment: Generalized weakness   Lower Extremity Assessment Lower  Extremity Assessment: Defer to PT evaluation (Reports numbness/tingling in LLE)   Cervical / Trunk Assessment Cervical / Trunk Assessment: Other exceptions Cervical / Trunk Exceptions: Body habitus   Communication     Cognition Arousal/Alertness: Awake/alert Behavior During Therapy: WFL for tasks assessed/performed Overall Cognitive Status: Within Functional Limits for tasks assessed                                     General Comments  2L O2 via [place] upon entry with SpO2 >95% at rest. Patient desat to 87% on RA at rest returning to >93% on 2L. SpO2 92% on 2L with bed mobility.    Exercises     Shoulder Instructions      Home Living Family/patient expects to be discharged to:: Private residence Living Arrangements: Spouse/significant other;Other (Comment) (Granddaughter (15y.o.)) Available Help at Discharge: [place]                                    Prior Functioning/Environment Level of Independence: Needs assistance  Gait / Transfers Assistance Needed: Wheelchair bound at baseline; reports taking 3 steps from bathroom door to commode in bathroom with use of RW. ADL's / Homemaking Assistance Needed: Reports sponge bathing/dressing and toileting without external assist at baseline. Communication / Swallowing Assistance Needed: Reports Mod I for cooking; does not drive; takes Print[employer] service to dialysis          OT Problem List: Decreased strength;Decreased activity tolerance;Impaired balance (sitting and/or standing);Impaired sensation;Obesity;Increased edema      OT Treatment/Interventions: Self-care/ADL training;Therapeutic exercise;Energy conservation;DME and/or AE instruction;Therapeutic activities;Patient/family education;Balance training    OT Goals(Current goals can be found in the care plan section) Acute Rehab OT Goals Patient Stated Goal: To return home with husband and granddaughter. OT Goal Formulation: With  patient Time For Goal Achievement: 07/08/21 Potential to Achieve Goals: Fair ADL Goals Pt Will Perform Grooming: with set-up;sitting Pt Will Perform Upper Body Dressing: with set-up;sitting Pt Will Transfer to Toilet: with mod assist;bedside commode;ambulating Pt Will Perform Toileting - Clothing Manipulation and hygiene: with mod assist;sitting/lateral leans;with adaptive equipment Pt/caregiver will Perform Home Exercise Program: Increased ROM;Increased strength;Both right and left upper extremity;With theraputty Additional ADL Goal #1: Patient will tolerate 8-10 minutes of therapeutic activity in prep for return to PLOF.  OT Frequency: Min 2X/week   Barriers to D/C:            Co-evaluation              AM-PAC OT "6 Clicks" Daily Activity     Outcome Measure Help from another person eating meals?: None Help from another person taking care of personal grooming?: A Little Help from another person toileting, which includes using toliet, bedpan, or urinal?: Total Help from another person bathing (including washing, rinsing, drying)?: Total Help from another person to put on and taking off regular upper  body clothing?: A Little Help from another person to put on and taking off regular lower body clothing?: Total 6 Click Score: 13   End of Session Equipment Utilized During Treatment: Gait belt;Oxygen Nurse Communication: Mobility status;Other (comment) (Response to treatment)  Activity Tolerance: Patient tolerated treatment well Patient left: in bed;with call bell/phone within reach;with bed alarm set  OT Visit Diagnosis: Other abnormalities of gait and mobility (R26.89);Muscle weakness (generalized) (M62.81);Pain Pain - Right/Left: Right Pain - part of body: Leg;Ankle and joints of foot (Low back)                Time: 4650-3546 OT Time Calculation (min): 35 min Charges:  OT General Charges $OT Visit: 1 Visit OT Evaluation $OT Eval Moderate Complexity: 1 Mod OT  Treatments $Therapeutic Activity: 8-22 mins  Asahd Can H. OTR/L Supplemental OT, Department of rehab services [phone]  Haidar Muse R H. 06/24/2021, 10:51 AM

## 2021-06-24 NOTE — Plan of Care (Signed)

## 2021-06-24 NOTE — Progress Notes (Addendum)
NAME:  [name], MRN:  [id number], DOB:  [birth year], LOS: 3 ADMISSION DATE:  06/21/2021, CONSULTATION DATE:  06/21/21  REFERRING MD:  Melina Copa, ED, CHIEF COMPLAINT:  lethargy    History of Present Illness:  Ms Lamontagne is a 55 year old woman with hx of obesity, lymphedema, ESRD on HD MWF, CHF, recently hospitalized at [facility] for cellulitis, discharged 7/3, returning back same day with lethargy, hypotension and elevated lactic acid.   She tells me her family was worried about her after d/c because she was sleeping a lot, called to have her brought back to hospital.  No change in pain /tenderness to RLE.   In ED: LR bolus 1.8, doxycicline, cefepime, flagyl given.  Pertinent  Medical History  Anemia Thyroid cancer,  CHF Chiari malformation, ESRD on HD MWF COPD?  CAD HLD  HTN DM lymphedema  Meds; acetaminophen, phoslo, keflex 06/21/21, clinda 06/21/21, lactulose prn, imodium prn, mvi,oxycodone 42m QID prn, senna, insulin/lispro BID  Significant Hospital Events: Including procedures, antibiotic start and stop dates in addition to other pertinent events   7/3 admitted to ICU on vasopressors, tachyarrhythmias overnight. Switched from NE> neosynephrine. On heparin. 7/4 Echo with acute on subacute R heart failure, LVH with no RWMA. Trop 3500.   Interim History / Subjective:  States she feels fine this morning. Notes she tolerated dialysis well this morning. Feels pain in leg and back about the same. Expresses desire to go home.   Objective   Blood pressure (!) 106/58, pulse 82, temperature 97.7 F (36.5 C), temperature source Oral, resp. rate 18, height _0  (1.651 m), weight 130.1 kg, SpO2 91 %.        Intake/Output Summary (Last 24 hours) at 06/24/2021 0731 Last data filed at 06/24/2021 0600 Gross per 24 hour  Intake 1093.79 ml  Output 1000 ml  Net 93.79 ml    Filed Weights   06/22/21 0100 06/23/21 1355 06/23/21 1736  Weight: 130.1 kg 131.1 kg 130.1 kg    Examination: Constitutional: no acute distress, sitting up in bed Eyes: EOMI, pupils equal Ears, nose, mouth, and throat: MMM, trachea midline Cardiovascular: normal rate and rhythm, normal S1, S2, no murmur Respiratory: diminished bases, ext warm Gastrointestinal: soft, hypoactive bS Ext: lymphedema, no obvious erythema or warmth Skin: No rashes, normal turgor Neurologic: Alert and oriented x4, moving all extremities Psychiatric: RASS 0  Resolved Hospital Problem list     Assessment & Plan:  Acute on chronic RV failure with shock Triggered by RLE cellulitis.  PVR on prior cath was normal. Left AV  fistula may be contributing.   -Continue midodrine -Wean neo as tolerated, maintain MAP>65 -May benefit from ligation as an outpatient.   ESRD on HD Tolerated HD yesterday of pressors, 1L removed. Plan to resume MWF schedule - HD today - nephrology consulted   Acute respiratory failure with hypoxia OSA SOPD Saturating in low 90s on 5 L, No respiratory symptoms. NM pulmonary perfusion study on 7/5 without appears negative but study limited. LE DVT studies from 7/2 although also limited imaging of the left calf. -wean O2 as tolerated to maintain SpO2 >90% - Stop heparin infusion  RLE cellulitis Continues to have pain the later right leg and lower back. Cultures with no growth to date. - continue on daptomycin -CT right femur, and pelvis wo contrast to evaluate for deeper infection given continued pain  Congestive hepatopathy AST improved to 899 this morning - continue to monitor  Best Practice (right click and "Reselect all  SmartList Selections" daily)   Diet/type: Regular consistency (see orders) DVT prophylaxis: prophylactic heparin  GI prophylaxis: PPI Lines: N/A Foley:  N/A Code Status:  full code Last date of multidisciplinary goals of care discussion [  ]  Iona Beard Internal Medicine, PGY-2 Pager [phone] 06/24/21 8:27 AM

## 2021-06-24 NOTE — Progress Notes (Signed)
PT Cancellation Note  Patient Details Name: [name] MRN: [id number] DOB: [birth year]   Cancelled Treatment:    Reason Eval/Treat Not Completed: Patient at procedure or test/unavailable  Off the floor at CT; will be going to HD. Will plan to see 06/25/21   Arby Barrette, PT Pager [phone]  [name] 06/24/2021, 11:07 AM

## 2021-06-24 NOTE — Progress Notes (Signed)
[facility] Progress Note    Assessment/ Plan:   # ESRD: Outpatient orders: [facility], Monday Wednesday Friday, 255 minutes.  250 BFR/500 DFR of the refill of her medications..  1K, 2.5 calcium, 138 sodium, EDW 135.5 kg, 15-gauge needles, machine temp 37 -s/p HD yesterday, will attempt HD again today to get her back on her MWF schedule, will try to UF 2-3L as tolerated (BP may be a limiting factor)   Acute on chronic RV failure with shock Sepsis, septic shock likely secondary to sepsis -abx and pressor support per ccm/primary -possible that her AVF is a contributing factor, will need to have this looked as an outpatient unless she is not making any improvements then would need vascular surgery to evaluate her for possible ligation -HF following -on midodrine   Lactic acidosis, metabolic acidosis -off nahco3 gtt, improved   Volume/ hypertension: EDW 135.5kg. Will UF as tolerated   Anemia of Chronic Kidney Disease: Hemoglobin 10.3. Currently receiving epo 12k units and venofer 2m. Holding on IV iron   Secondary Hyperparathyroidism/Hyperphosphatemia: sensipar [id number]    Vascular access: lue avf +b/t   DM2 with hyperglycemia: mgmt per primary service   # Additional recommendations: - Dose all meds for creatinine clearance < 10 ml/min  - Unless absolutely necessary, no MRIs with gadolinium.  - Implement save arm precautions.  Prefer needle sticks in the dorsum of the hands or wrists.  No blood pressure measurements in arm. - If blood transfusion is requested during hemodialysis sessions, please alert uKoreaprior to the session. - If a hemodialysis catheter line culture is requested, please alert u[place]oreaas only hemodialysis nurses are able to collect those specimens.   VGean Quint MD [facility]  Subjective:   No acute events, on low dose levo from overnight. Tolerated hd well yesterday. In fact, was able to get off pressors during HD. Net UF 1L.  No complaints from patient at this time. HD today    Objective:   BP (!) 106/58   Pulse 82   Temp 97.8 F (36.6 C) (Oral)   Resp 18   Ht _0  (1.651 m)   Wt 130.1 kg   SpO2 91%   BMI 47.73 kg/m   Intake/Output Summary (Last 24 hours) at 06/24/2021 06387Last data filed at 06/24/2021 0600 Gross per 24 hour  Intake 989.83 ml  Output 1000 ml  Net -10.17 ml   Weight change:   Physical Exam: Gen:nad, sitting up in bed CVS:s1s2, rrr Resp:cta bl AFIE:PPIRJ soft, nt EJOA:[id number] LE's w/ chronic skin changes Neuro: awake, alert, moves all ext spontaneously, speech clear and coherent HD access: lue avf +b/t  Imaging: NM Pulmonary Perfusion  Result Date: 06/23/2021 CLINICAL DATA:  Respiratory failure, hypoxia and concern for pulmonary embolism. EXAM: NUCLEAR MEDICINE PERFUSION LUNG SCAN TECHNIQUE: Perfusion images were obtained in multiple projections after intravenous injection of radiopharmaceutical. Ventilation scans intentionally deferred if perfusion scan and chest x-ray adequate for interpretation during COVID 19 epidemic. RADIOPHARMACEUTICALS:  4.2 mCi Tc-970mAA IV COMPARISON:  Chest x-ray same date. FINDINGS: Extensive soft tissue attenuation due to patient body habitus. Positioning appears nonstandard perhaps also due to patient body habitus. There is a photopenic area over the LEFT upper lobe that does not appear clearly wedge-shaped and does not correspond well on lateral projections which are limited by overlapping soft tissue. This could even be vascular based on the appearance of the mediastinum and heart on the chest x-ray. Image labeled LAO appears  more like a true AP and image labeled RPO appears more as a posterior projection. These images are relatively normal accounting for technical factors outlined above. IMPRESSION: Markedly limited evaluation with photopenic area particularly in the LEFT chest that is favored to represent artifact. Given the limited nature of  the of the evaluation it is difficult to exclude the possibility of perfusion defect. Consider lower extremity venous sonogram for further assessment as clinically warranted to guide further management. These results will be called to the ordering clinician or representative by the Radiologist Assistant, and communication documented in the PACS or [employer]. Electronically Signed   By: Zetta Bills M.D.   On: 06/23/2021 20:51   DG CHEST PORT 1 VIEW  Result Date: 06/23/2021 CLINICAL DATA:  History of V/Q scan. EXAM: PORTABLE CHEST 1 VIEW COMPARISON:  June 21, 2021. FINDINGS: Stable cardiomegaly with central pulmonary vascular congestion. No pneumothorax is noted. Mild left basilar atelectasis is noted. Mild right basilar atelectasis and pleural effusion is noted. Bony thorax is unremarkable. IMPRESSION: Bibasilar atelectasis is noted with probable right pleural effusion. Stable cardiomegaly with central pulmonary vascular congestion. Aortic Atherosclerosis (ICD10-I70.0). Electronically Signed   By: Marijo Conception M.D.   On: 06/23/2021 15:29   ECHOCARDIOGRAM COMPLETE  Result Date: 06/22/2021    ECHOCARDIOGRAM REPORT   Patient Name:   [name] Date of Exam: 06/22/2021 Medical Rec #:  [id number]        Height:       65.0 in Accession #:    [id number]       Weight:       286.8 lb Date of Birth:  03/22/1966        BSA:          2.306 m Patient Age:    55 years         BP:           86/58 mmHg Patient Gender: F                HR:           87 bpm. Exam Location:  Inpatient Procedure: 2D Echo, Color Doppler, Cardiac Doppler and Intracardiac            Opacification Agent                          STAT ECHO Reported to: Dr Rudean Haskell on 06/22/2021 8:57:00 AM. Indications:    Elevated troponin  History:        Patient has prior history of Echocardiogram examinations, most                 recent 07/12/2017. CHF; Risk Factors:Hypertension, Diabetes and                 Dyslipidemia. ESRD. Cor pulmonale.   Sonographer:    Clayton Lefort RDCS (AE) Referring Phys: [id number] Julian Hy  Sonographer Comments: Technically difficult study due to poor echo windows and patient is morbidly obese. Image acquisition challenging due to patient body habitus. IMPRESSIONS  1. Left ventricular ejection fraction, by estimation, is 60 to 65%. The left ventricle has normal function. The left ventricle has no regional wall motion abnormalities. There is moderate concentric left ventricular hypertrophy. Left ventricular diastolic parameters are consistent with Grade I diastolic dysfunction (impaired relaxation). The left ventricular cavity is small and may be underfilled. There is no evidence of LVTO Obstruction.  2. Right ventricular systolic  function is moderately to severely reduced. The right ventricular size is severely enlarged. There is severely elevated pulmonary artery systolic pressure. The estimated right ventricular systolic pressure is 32.9 mmHg.  3. Tricuspid valve regurgitation is moderate to severe and functional in nature.  4. Right atrial size was severely dilated.  5. The aortic valve is tricuspid. There is mild thickening of the aortic valve. Aortic valve regurgitation is not visualized. No aortic stenosis is present. Aortic valve mean gradient measures 4.0 mmHg.  6. The mitral valve is grossly normal. No evidence of mitral valve regurgitation. The mean mitral valve gradient is 1.0 mmHg with average heart rate of 79 bpm.  7. The inferior vena cava is dilated in size with <50% respiratory variability, suggesting right atrial pressure of 15 mmHg. Comparison(s): A prior study was performed on 07/12/2017. Prior images reviewed side by side. RV function has worsened. FINDINGS  Left Ventricle: Left ventricular ejection fraction, by estimation, is 60 to 65%. The left ventricle has normal function. The left ventricle has no regional wall motion abnormalities. Definity contrast agent was given IV to delineate the left ventricular   endocardial borders. The left ventricular internal cavity size was small. There is moderate concentric left ventricular hypertrophy. Left ventricular diastolic parameters are consistent with Grade I diastolic dysfunction (impaired relaxation). Right Ventricle: The right ventricular size is severely enlarged. Right vetricular wall thickness was not assessed. Right ventricular systolic function is moderately reduced. There is severely elevated pulmonary artery systolic pressure. The tricuspid regurgitant velocity is 3.67 m/s, and with an assumed right atrial pressure of 15 mmHg, the estimated right ventricular systolic pressure is 92.4 mmHg. Left Atrium: Left atrial size was normal in size. Right Atrium: Right atrial size was severely dilated. Pericardium: There is no evidence of pericardial effusion. Mitral Valve: The mitral valve is grossly normal. Mild mitral annular calcification. No evidence of mitral valve regurgitation. MV peak gradient, 2.7 mmHg. The mean mitral valve gradient is 1.0 mmHg with average heart rate of 79 bpm. Tricuspid Valve: The tricuspid valve is normal in structure. Tricuspid valve regurgitation is moderate to severe. Aortic Valve: The aortic valve is tricuspid. There is mild thickening of the aortic valve. Aortic valve regurgitation is not visualized. No aortic stenosis is present. Aortic valve mean gradient measures 4.0 mmHg. Aortic valve peak gradient measures 8.1 mmHg. Aortic valve area, by VTI measures 2.24 cm. Pulmonic Valve: Discordance between Color and Spectral Doppler. The pulmonic valve was not well visualized. Pulmonic valve regurgitation is mild. No evidence of pulmonic stenosis. Aorta: The aortic root and ascending aorta are structurally normal, with no evidence of dilitation. Venous: The inferior vena cava is dilated in size with less than 50% respiratory variability, suggesting right atrial pressure of 15 mmHg. IAS/Shunts: The atrial septum is grossly normal.  LEFT VENTRICLE  PLAX 2D LVIDd:         3.30 cm  Diastology LVIDs:         1.80 cm  LV e' medial:    8.38 cm/s LV PW:         1.80 cm  LV E/e' medial:  9.8 LV IVS:        1.40 cm  LV e' lateral:   12.70 cm/s LVOT diam:     1.90 cm  LV E/e' lateral: 6.4 LV SV:         53 LV SV Index:   23 LVOT Area:     2.84 cm  RIGHT VENTRICLE  IVC RV Basal diam:  4.80 cm     IVC diam: 2.60 cm RV Mid diam:    4.60 cm RV S prime:     10.00 cm/s TAPSE (M-mode): 1.7 cm LEFT ATRIUM             Index       RIGHT ATRIUM           Index LA diam:        3.30 cm 1.43 cm/m  RA Area:     31.60 cm LA Vol (A2C):   78.7 ml 34.13 ml/m RA Volume:   129.00 ml 55.95 ml/m LA Vol (A4C):   76.9 ml 33.35 ml/m LA Biplane Vol: 78.1 ml 33.87 ml/m  AORTIC VALVE AV Area (Vmax):    2.08 cm AV Area (Vmean):   1.95 cm AV Area (VTI):     2.24 cm AV Vmax:           142.00 cm/s AV Vmean:          96.200 cm/s AV VTI:            0.235 m AV Peak Grad:      8.1 mmHg AV Mean Grad:      4.0 mmHg LVOT Vmax:         104.00 cm/s LVOT Vmean:        66.000 cm/s LVOT VTI:          0.186 m LVOT/AV VTI ratio: 0.79  AORTA Ao Root diam: 3.10 cm Ao Asc diam:  3.40 cm MITRAL VALVE               TRICUSPID VALVE MV Area (PHT): 3.45 cm    TR Peak grad:   53.9 mmHg MV Area VTI:   2.29 cm    TR Vmax:        367.00 cm/s MV Peak grad:  2.7 mmHg MV Mean grad:  1.0 mmHg    SHUNTS MV Vmax:       0.82 m/s    Systemic VTI:  0.19 m MV Vmean:      54.4 cm/s   Systemic Diam: 1.90 cm MV Decel Time: 220 msec MV E velocity: 81.80 cm/s MV A velocity: 67.60 cm/s MV E/A ratio:  1.21 Rudean Haskell MD Electronically signed by Rudean Haskell MD Signature Date/Time: 06/22/2021/9:24:35 AM    Final     Labs: BMET Recent Labs  Lab 06/20/21 9326 06/20/21 0911 06/21/21 2254 06/22/21 0454 06/22/21 0934 06/22/21 1400 06/22/21 1414 06/23/21 0153 06/23/21 0954  NA 135 133* 132*  --  133* 133* 131* 131* 134*  K 3.9 3.6 4.8  --  5.0 4.8 4.7 5.1 4.6  CL 93* 91* 93*  --  97* 97*  --   95* 99  CO2 25 27 17*  --  21* 23  --  22 22  GLUCOSE 117* 119* 152*  --  142* 97  --  79 96  BUN 25* 26* 44*  --  44* 46*  --  52* 55*  CREATININE 5.72* 6.14* 8.45* 8.60* 8.92* 8.39*  --  9.23* 9.05*  CALCIUM 7.4* 6.8* 7.4*  --  7.6* 7.3*  --  7.7* 7.4*   CBC Recent Labs  Lab 06/19/21 1225 06/20/21 0610 06/21/21 2130 06/22/21 0454 06/22/21 0934 06/22/21 1414 06/23/21 0153 06/23/21 1912 06/24/21 0254  WBC 7.0   < > 8.8   < > 10.0  --  10.4 8.3 9.0  NEUTROABS 4.9  --  7.1  --  7.5  --   --   --   --   HGB 11.4*   < > 11.5*   < > 11.2* 11.6* 10.9* 11.6* 10.3*  HCT 36.7   < > 37.5   < > 34.9* 34.0* 33.8* 34.8* 31.4*  MCV 98.4   < > 103.3*   < > 98.6  --  97.4 96.4 96.9  PLT 201   < > 203   < > 181  --  174 151 214   < > = values in this interval not displayed.    Medications:     aspirin  81 mg Oral Daily   calcium acetate  667 mg Oral TID WC   Chlorhexidine Gluconate Cloth  6 each Topical Daily   mouth rinse  15 mL Mouth Rinse BID   midodrine  5 mg Oral TID WC   pantoprazole (PROTONIX) IV  40 mg Intravenous QHS      Gean Quint, MD [facility] 06/24/2021, 8:28 AM

## 2021-06-25 ENCOUNTER — Ambulatory Visit: Payer: 59 | Admitting: Internal Medicine

## 2021-06-25 DIAGNOSIS — R6521 Severe sepsis with septic shock: Secondary | ICD-10-CM | POA: Diagnosis not present

## 2021-06-25 DIAGNOSIS — R579 Shock, unspecified: Secondary | ICD-10-CM

## 2021-06-25 DIAGNOSIS — A419 Sepsis, unspecified organism: Secondary | ICD-10-CM | POA: Diagnosis not present

## 2021-06-25 DIAGNOSIS — R570 Cardiogenic shock: Secondary | ICD-10-CM

## 2021-06-25 LAB — CBC
HCT: 30.8 % — ABNORMAL LOW (ref 36.0–46.0)
Hemoglobin: 9.9 g/dL — ABNORMAL LOW (ref 12.0–15.0)
MCH: 31.5 pg (ref 26.0–34.0)
MCHC: 32.1 g/dL (ref 30.0–36.0)
MCV: 98.1 fL (ref 80.0–100.0)
Platelets: 175 10*3/uL (ref 150–400)
RBC: 3.14 MIL/uL — ABNORMAL LOW (ref 3.87–5.11)
RDW: 14.8 % (ref 11.5–15.5)
WBC: 7.7 10*3/uL (ref 4.0–10.5)
nRBC: 1.7 % — ABNORMAL HIGH (ref 0.0–0.2)

## 2021-06-25 LAB — HEPATIC FUNCTION PANEL
ALT: 9 U/L (ref 0–44)
AST: 343 U/L — ABNORMAL HIGH (ref 15–41)
Albumin: 2.9 g/dL — ABNORMAL LOW (ref 3.5–5.0)
Alkaline Phosphatase: 351 U/L — ABNORMAL HIGH (ref 38–126)
Bilirubin, Direct: 0.2 mg/dL (ref 0.0–0.2)
Indirect Bilirubin: 0.7 mg/dL (ref 0.3–0.9)
Total Bilirubin: 0.9 mg/dL (ref 0.3–1.2)
Total Protein: 5.8 g/dL — ABNORMAL LOW (ref 6.5–8.1)

## 2021-06-25 LAB — GLUCOSE, CAPILLARY
Glucose-Capillary: 128 mg/dL — ABNORMAL HIGH (ref 70–99)
Glucose-Capillary: 112 mg/dL — ABNORMAL HIGH (ref 70–99)
Glucose-Capillary: 142 mg/dL — ABNORMAL HIGH (ref 70–99)
Glucose-Capillary: 150 mg/dL — ABNORMAL HIGH (ref 70–99)

## 2021-06-25 MED ORDER — METOPROLOL SUCCINATE ER 25 MG PO TB24
12.5000 mg | ORAL_TABLET | Freq: Every day | ORAL | Status: DC
  Administered 2021-06-25 – 2021-06-29 (×3): 12.5 mg via ORAL
  Filled 2021-06-25 (×3): qty 1

## 2021-06-25 MED ORDER — CALCIUM GLUCONATE-NACL 1-0.675 GM/50ML-% IV SOLN
1.0000 g | Freq: Once | INTRAVENOUS | Status: AC
  Administered 2021-06-25: 1000 mg via INTRAVENOUS
  Filled 2021-06-25: qty 50

## 2021-06-25 NOTE — TOC Initial Note (Signed)
Transition of Care (TOC) - Initial/Assessment Note  Heart Failure   Patient Details  Name: [name] MRN: [id number] Date of Birth: 1966/07/24  Transition of Care [facility]) CM/SW Contact:    [place], [place] Phone Number: 06/25/2021, 10:56 AM  Clinical Narrative:                 CSW spoke with the patient at bedside and completed very brief SDOH screening with the patient who denied having any needs at this time. Mrs. Markes called her husband Octavia Bruckner to also talk with the CSW as Mrs. Ferraiolo reported feeling tired and wanting his help. Mrs. Fulginiti's husband denied having any social needs for their family at this time as well but said maybe down to the road they might need something but as for right now they are okay.     Barriers to Discharge: Continued Medical Work up   Patient Goals and CMS Choice        Expected Discharge Plan and Services   In-house Referral: Clinical Social Work     Living arrangements for the past 2 months: Single Family Home                                      Prior Living Arrangements/Services Living arrangements for the past 2 months: Single Family Home Lives with:: Spouse, Self Patient language and need for interpreter reviewed:: Yes        Need for Family Participation in Patient Care: Yes (Comment) Care giver support system in place?: No (comment)   Criminal Activity/Legal Involvement Pertinent to Current Situation/Hospitalization: No - Comment as needed  Activities of Daily Living      Permission Sought/Granted                  Emotional Assessment Appearance:: Appears stated age Attitude/Demeanor/Rapport: Engaged Affect (typically observed): Pleasant Orientation: : Oriented to Self, Oriented to Place, Oriented to  Time, Oriented to Situation   Psych Involvement: No (comment)  Admission diagnosis:  ESRD (end stage renal disease) ([place]) [N18.6] Elevated troponin [R77.8] Elevated LFTs [R79.89] Septic shock (HCC)  [A41.9, R65.21] Cellulitis [L03.90] Patient Active Problem List   Diagnosis Date Noted   Cardiogenic shock ([place])    Cellulitis, leg 06/20/2021   COVID-19 02/03/2021   Enteritis, enteropathogenic E. coli 07/02/2020   Bacteremia 07/01/2020   Sepsis due to undetermined organism ([place]) 06/30/2020   Anemia, unspecified    Essential (primary) hypertension    Hyperlipidemia, unspecified    Type 2 diabetes mellitus with hyperglycemia ([place])    Chronic kidney disease, stage 5 ([place])    Chronic obstructive pulmonary disease, unspecified ([place])    Fibromyalgia    Hypertensive chronic kidney disease with stage 1 through stage 4 chronic kidney disease, or unspecified chronic kidney disease    Hypertensive chronic kidney disease with stage 5 chronic kidney disease or end stage renal disease ([place])    Lymphedema, not elsewhere classified    Morbid (severe) obesity due to excess calories ([place])    Peritonitis, unspecified ([place])    Type 2 diabetes mellitus with diabetic nephropathy ([place])    Abdominal pain 04/20/2019   Peritonitis ([place]) 12/21/2018   Abdominal pain, LLQ 12/21/2018   Influenza B 12/21/2018   SOB (shortness of breath) 06/27/2018   Hypokalemia 06/27/2018   Chronic renal failure    Septic shock ([place]) 01/24/2017   Shock liver 01/24/2017   Cor  pulmonale, chronic (HCC) 01/24/2017   Elevated troponin    Flu-like symptoms 01/18/2017   Fever 01/18/2017   HCAP (healthcare-associated pneumonia) 12/31/2016   Numbness and tingling in right hand 08/13/2016   Sepsis ([place]) 08/11/2016   Coagulase negative Staphylococcus bacteremia 06/11/2016   ESRD (end stage renal disease) ([place]) 06/06/2016   Chronic cholecystitis 12/09/2015   Protein-calorie malnutrition, severe 11/29/2015   Respiratory arrest ([place]) 11/28/2015   Cellulitis 11/13/2015   Recurrent cellulitis of lower leg 11/13/2015   Chronic diastolic CHF (congestive heart failure) ([place]) 11/13/2015   Chronic respiratory failure with hypoxia ([place])  11/13/2015   ESRD on dialysis ([place]) 11/13/2015   Anemia of chronic disease 11/13/2015   Rotator cuff syndrome of right shoulder 05/30/2013   Pain in joint, shoulder region 05/30/2013   Obesity hypoventilation syndrome ([place]) 09/04/2012   Hyperkalemia 11/11/2011   Leukopenia 11/11/2011   HTN (hypertension) 11/11/2011   HLD (hyperlipidemia) 11/11/2011   Transaminitis 11/11/2011   Diabetes mellitus ([place]) 10/27/2011   Morbid obesity ([place]) 10/27/2011   Lymphedema of lower extremity 10/04/2011   PCP:  Noreene Larsson, NP Pharmacy:   [facility], [place] - [place] [address] [place] [address] Phone: [phone] Fax: [phone]     Social Determinants of Health (SDOH) Interventions Food Insecurity Interventions: Intervention Not Indicated Financial Strain Interventions: Intervention Not Indicated Housing Interventions: Intervention Not Indicated Transportation Interventions: Intervention Not Indicated  Readmission Risk Interventions Readmission Risk Prevention Plan 04/30/2019  Transportation Screening Complete  Medication Review Press photographer) Complete  PCP or Specialist appointment within 3-5 days of discharge Complete  HRI or [place] Complete  SW Recovery Care/Counseling Consult Complete  [place] Not Applicable  Some recent data might be hidden    Isaak Delmundo, MSW, LCSWA [phone] Heart Failure Social Worker

## 2021-06-25 NOTE — Plan of Care (Signed)
  Problem: Education: Goal: Knowledge of General Education information will improve Description: Including pain rating scale, medication(s)/side effects and non-pharmacologic comfort measures Outcome: Progressing   Problem: Health Behavior/Discharge Planning: Goal: Ability to manage health-related needs will improve Outcome: Progressing

## 2021-06-25 NOTE — Progress Notes (Signed)
NAME:  [name], MRN:  [id number], DOB:  11-25-1966, LOS: 4 ADMISSION DATE:  06/21/2021, CONSULTATION DATE:  06/21/21  REFERRING MD:  Melina Copa, ED, CHIEF COMPLAINT:  lethargy    History of Present Illness:  [name] is a 55 year old woman with hx of obesity, lymphedema, ESRD on HD MWF, CHF, recently hospitalized at [facility] for cellulitis, discharged 7/3, returning back same day with lethargy, hypotension and elevated lactic acid.   She tells me her family was worried about her after d/c because she was sleeping a lot, called to have her brought back to hospital.  No change in pain /tenderness to RLE.   In ED: LR bolus 1.8, doxycicline, cefepime, flagyl given.  Pertinent  Medical History  Anemia Thyroid cancer,  CHF Chiari malformation, ESRD on HD MWF COPD?  CAD HLD  HTN DM lymphedema  Meds; acetaminophen, phoslo, keflex 06/21/21, clinda 06/21/21, lactulose prn, imodium prn, mvi,oxycodone 47m QID prn, senna, insulin/lispro BID  Significant Hospital Events: Including procedures, antibiotic start and stop dates in addition to other pertinent events   7/3 admitted to ICU on vasopressors, tachyarrhythmias overnight. Switched from NE> neosynephrine. On heparin. 7/4 Echo with acute on subacute R heart failure, LVH with no RWMA. Trop 3500.  7/6 off pressors  Interim History / Subjective:  Awake this morning. Endorses pain in bilateral thighs, feels legs are more swollen today and now on left side as well. Tolerated dialysis yesterday.   Objective   Blood pressure (!) 90/53, pulse 74, temperature 97.6 F (36.4 C), temperature source Oral, resp. rate 18, height _0  (1.651 m), weight 130.7 kg, SpO2 90 %.        Intake/Output Summary (Last 24 hours) at 06/25/2021 [id number] data filed at 06/24/2021 1730 Gross per 24 hour  Intake 520.51 ml  Output 68 ml  Net 452.51 ml    Filed Weights   06/23/21 1355 06/23/21 1736 06/25/21 0400  Weight: 131.1 kg 130.1 kg 130.7 kg    Examination: Constitutional: no acute distress, sitting up in bed Eyes: EOMI, pupils equal Ears, nose, mouth, and throat: MMM, trachea midline Cardiovascular: normal rate and rhythm, normal S1, S2, no murmur Respiratory: clear to ascultation, no wheezing or crackles Gastrointestinal: soft, nontender, normal bowel sounds Ext: lymphedema bilaterally with mild TTP , no obvious erythema or warmth Skin: No rashes, normal turgor Neurologic: Alert and oriented x4, moving all extremities Psychiatric: RASS 0  Resolved Hospital Problem list     Assessment & Plan:  Acute on chronic RV failure with shock Triggered by RLE cellulitis.  PVR on prior cath was normal. Left AV  fistula may be contributing.   -Continue midodrine -Off pressors, stable for transfer to cardiac tele, triad to assume care - continue midodrine -May benefit from AV fistula ligation as an outpatient.   Tachycardia  NSR this morning but has had episodes of AVNRT/AVRT  - Started metoprolol succinate 12.5 mg daily by heart failure  ESRD on HD Tolerated HD yesterday off pressors since yesterday, 1L removed. Continues to appear fluid overloaded.   - HD MWF - nephrology consulted   RLE cellulitis Continue to have leg pain although likely due to lymphedema.  - continue on daptomycin for total of 7 days  Congestive hepatopathy- improving - continue to monitor  Best Practice (right click and "Reselect all SmartList Selections" daily)   Diet/type: Regular consistency (see orders) DVT prophylaxis: prophylactic heparin  GI prophylaxis: N/A and PPI Lines: N/A Foley:  N/A Code Status:  full code Last date of multidisciplinary goals of care discussion [  ]  Iona Beard Internal Medicine, PGY-2 Pager [phone] 06/25/21 7:14 AM

## 2021-06-25 NOTE — Evaluation (Signed)
Physical Therapy Evaluation Patient Details Name: [name] MRN: [id number] DOB: 08-12-1966 Today's Date: 06/25/2021   History of Present Illness  54 y.o. female presenting from [facility] ED 7/3 with lethargy, hypotension and elevated lactic acid after d/c from [facility] earlier that day. Admitted at [facility] several days ago with cellulitis, given IV antibiotics and d/c'd home secondary to passing of father. This admission patient found to have AMS, septic shock, acute respiratory failure, acute on chronic RV failure.  PMHX significant for obesity, DMII, COPD, CAD, lymphedema, ESRD on HD MWF and CHF.  Clinical Impression   Pt admitted secondary to problem above with deficits below. PTA patient was using power chair for primary means of locomotion. She could get up to EOB and then pivot to power chair modified independently. She also was walking several steps into bathroom with her cane due to chair not fitting into her bathroom.  Pt currently requires +2 mod to max assist with bed mobility and has been unable to stand (combination of safety concerns with height of ICU bed, feet not really reaching the floor and gripper socks sliding on tile. Patient wants to go home and states she knows she is not strong enough right now.  Anticipate patient will benefit from PT to address problems listed below.Will continue to follow acutely to maximize functional mobility independence and safety.       Follow Up Recommendations SNF    Equipment Recommendations  None recommended by PT    Recommendations for Other Services       Precautions / Restrictions Precautions Precautions: Fall Precaution Comments: Monitor SpO2      Mobility  Bed Mobility Overal bed mobility: Needs Assistance Bed Mobility: Supine to Sit;Sit to Supine     Supine to sit: Min assist;HOB elevated;+2 for safety/equipment Sit to supine: Max assist;+2 for physical assistance (pt managed upper body; 1 person on each leg to help raise onto bed)    General bed mobility comments: more difficulty today moving her legs than 7/6 and reports due to incr edema making legs even heavier; assisted each leg mod assist to get over EOB prior to pt raising torso to sit    Transfers Overall transfer level: Needs assistance               General transfer comment: NT-ICU bed would not go low enough for pt to feel safe attempting sit to stand  Ambulation/Gait             General Gait Details: unable to assess  Stairs            Wheelchair Mobility    Modified Rankin (Stroke Patients Only)       Balance Overall balance assessment: Needs assistance Sitting-balance support: Single extremity supported;Bilateral upper extremity supported;Feet supported Sitting balance-Leahy Scale: Fair Sitting balance - Comments: Able to maintain static sitting balance at EOB with and without unilateral UE support.                                     Pertinent Vitals/Pain Pain Assessment: 0-10 Pain Score: 6  Pain Location: R hip and low back Pain Descriptors / Indicators: Aching;Sore Pain Intervention(s): Limited activity within patient's tolerance;Monitored during session;Patient requesting pain meds-RN notified;RN gave pain meds during session    Home Living Family/patient expects to be discharged to:: Private residence Living Arrangements: Spouse/significant other;Other (Comment) (Granddaughter (15y.o.)) Available Help at Discharge: Family Type  of Home: House Home Access: Ramped entrance     Home Layout: One level Home Equipment: Wheelchair - power;Wheelchair - manual;Cane - single point;Walker - 2 wheels      Prior Function Level of Independence: Needs assistance   Gait / Transfers Assistance Needed: Wheelchair bound at baseline; reports taking 3 steps with cane and holding sink from bathroom door to commode  ADL's / Homemaking Assistance Needed: Reports sponge bathing/dressing and toileting without external  assist at baseline.        Hand Dominance   Dominant Hand: Right    Extremity/Trunk Assessment   Upper Extremity Assessment Upper Extremity Assessment: Generalized weakness    Lower Extremity Assessment Lower Extremity Assessment:  (bil LEs very edematous with lymphedema Lt>Rt; feet very swollen and PT can barely don gripper socks; AAROM moving legs in bed, off EOB and up onto EOM)    Cervical / Trunk Assessment Cervical / Trunk Assessment: Other exceptions Cervical / Trunk Exceptions: Body habitus  Communication   Communication: No difficulties  Cognition Arousal/Alertness: Awake/alert Behavior During Therapy: WFL for tasks assessed/performed Overall Cognitive Status: Within Functional Limits for tasks assessed                                        General Comments General comments (skin integrity, edema, etc.): 2L O2 with sats 89-93% throughout session (typically higher with deep breathing during exercises)    Exercises General Exercises - Lower Extremity Long Arc Quad: AROM;Both;10 reps (3 sets) Hip Flexion/Marching: AROM;Both;10 reps (x 2 sets) Toe Raises: AROM;10 reps Heel Raises: AROM;10 reps   Assessment/Plan    PT Assessment Patient needs continued PT services  PT Problem List Decreased strength;Decreased activity tolerance;Decreased balance;Decreased mobility;Decreased knowledge of use of DME;Cardiopulmonary status limiting activity;Obesity;Pain       PT Treatment Interventions DME instruction;Gait training;Functional mobility training;Therapeutic activities;Therapeutic exercise;Patient/family education    PT Goals (Current goals can be found in the Care Plan section)  Acute Rehab PT Goals Patient Stated Goal: To return home with husband and granddaughter. PT Goal Formulation: With patient Time For Goal Achievement: 07/09/21 Potential to Achieve Goals: Good    Frequency Min 3X/week   Barriers to discharge        Co-evaluation                AM-PAC PT "6 Clicks" Mobility  Outcome Measure Help needed turning from your back to your side while in a flat bed without using bedrails?: Total Help needed moving from lying on your back to sitting on the side of a flat bed without using bedrails?: Total Help needed moving to and from a bed to a chair (including a wheelchair)?: Total Help needed standing up from a chair using your arms (e.g., wheelchair or bedside chair)?: Total Help needed to walk in hospital room?: Total Help needed climbing 3-5 steps with a railing? : Total 6 Click Score: 6    End of Session Equipment Utilized During Treatment: Oxygen Activity Tolerance: Patient tolerated treatment well Patient left: in bed;with call bell/phone within reach;with bed alarm set Nurse Communication: Need for lift equipment PT Visit Diagnosis: Other abnormalities of gait and mobility (R26.89);Muscle weakness (generalized) (M62.81)    Time: 8338-2505 PT Time Calculation (min) (ACUTE ONLY): 29 min   Charges:   PT Evaluation $PT Eval Moderate Complexity: 1 Mod PT Treatments $Therapeutic Exercise: 8-22 mins  Arby Barrette, PT Pager [phone]   Rexanne Mano 06/25/2021, 3:25 PM

## 2021-06-25 NOTE — Progress Notes (Signed)
[facility] Progress Note    Assessment/ Plan:   # ESRD: Outpatient orders: [facility] , Monday Wednesday Friday, 255 minutes.  250 BFR/500 DFR of the refill of her medications..  1K, 2.5 calcium, 138 sodium, EDW 135.5 kg, 15-gauge needles, machine temp 37 -hd tomorrow, plan for increased UF   Acute on chronic RV failure with shock Sepsis, septic shock likely secondary to sepsis -abx and pressor support per ccm/primary -possible that her AVF is a contributing factor, will need to have this looked as an outpatient unless she is not making any improvements then would need vascular surgery to evaluate her for possible ligation -HF following -on midodrine   Lactic acidosis, metabolic acidosis -off nahco3 gtt, improved   Volume/ hypertension: EDW 135.5kg. Will UF as tolerated   Anemia of Chronic Kidney Disease: Currently receiving epo 12k units and venofer 47m. Holding on IV iron   Secondary Hyperparathyroidism/Hyperphosphatemia: sensipar 60 qtx    Vascular access: lue avf +b/t. Will likely need this ligated as an oupatient   DM2 with hyperglycemia: mgmt per primary service   # Additional recommendations: - Dose all meds for creatinine clearance < 10 ml/min  - Unless absolutely necessary, no MRIs with gadolinium.  - Implement save arm precautions.  Prefer needle sticks in the dorsum of the hands or wrists.  No blood pressure measurements in arm. - If blood transfusion is requested during hemodialysis sessions, please alert uKoreaprior to the session. - If a hemodialysis catheter line culture is requested, please alert u[place]oreaas only hemodialysis nurses are able to collect those specimens.   VGean Quint MD [facility]  Subjective:   No acute events, off pressors. UF with HD limited by tachyarrthmias yesterday. Asymptomatic. Net uf only 68cc, resolved with changing to 3k bath. She reports that legs are feeling tighter from fluid build up    Objective:   BP (!) 90/53   Pulse 74   Temp 97.6 F (36.4 C) (Oral)   Resp 18   Ht _0  (1.651 m)   Wt 130.7 kg   SpO2 90%   BMI 47.95 kg/m   Intake/Output Summary (Last 24 hours) at 06/25/2021 1049 Last data filed at 06/24/2021 1730 Gross per 24 hour  Intake 240 ml  Output 68 ml  Net 172 ml   Weight change: -0.4 kg  Physical Exam: Gen:nad, sitting up in bed CVS:s1s2, rrr Resp:cta bl ATDV:VOHYW soft, nt EVPX:TGGYIRSWNIbilateral LE's w/ chronic skin changes Neuro: awake, alert, moves all ext spontaneously, speech clear and coherent HD access: lue avf +b/t  Imaging: CT PELVIS WO CONTRAST  Result Date: 06/24/2021 CLINICAL DATA:  Right thigh infection. EXAM: CT PELVIS WITHOUT CONTRAST TECHNIQUE: Multidetector CT imaging of the pelvis was performed following the standard protocol without intravenous contrast. COMPARISON:  CT abdomen pelvis dated June 19, 2021. FINDINGS: Urinary Tract:  No abnormality visualized.  Decompressed bladder. Bowel:  Unremarkable visualized pelvic bowel loops. Vascular/Lymphatic: Aortoiliac atherosclerotic vascular disease. Unchanged mildly enlarged left common iliac lymph node measuring 1.2 cm in short axis. Reproductive: Prior hysterectomy. 3.2 x 2.2 cm cystic lesion in the right ovary is unchanged since the prior study from 5 days ago, but has decreased in size since May 2020, consistent with benign etiology. Other:  None. Musculoskeletal: Mild anasarca. No fluid collection or subcutaneous emphysema. No acute or significant osseous findings. IMPRESSION: 1. Mild anasarca. No fluid collection. 2. Aortic Atherosclerosis (ICD10-I70.0). Electronically Signed   By: WTitus DubinM.D.   On: 06/24/2021 11:30  NM Pulmonary Perfusion  Result Date: 06/23/2021 CLINICAL DATA:  Respiratory failure, hypoxia and concern for pulmonary embolism. EXAM: NUCLEAR MEDICINE PERFUSION LUNG SCAN TECHNIQUE: Perfusion images were obtained in multiple projections after intravenous  injection of radiopharmaceutical. Ventilation scans intentionally deferred if perfusion scan and chest x-ray adequate for interpretation during COVID 19 epidemic. RADIOPHARMACEUTICALS:  4.2 mCi Tc-38mMAA IV COMPARISON:  Chest x-ray same date. FINDINGS: Extensive soft tissue attenuation due to patient body habitus. Positioning appears nonstandard perhaps also due to patient body habitus. There is a photopenic area over the LEFT upper lobe that does not appear clearly wedge-shaped and does not correspond well on lateral projections which are limited by overlapping soft tissue. This could even be vascular based on the appearance of the mediastinum and heart on the chest x-ray. Image labeled LAO appears more like a true AP and image labeled RPO appears more as a posterior projection. These images are relatively normal accounting for technical factors outlined above. IMPRESSION: Markedly limited evaluation with photopenic area particularly in the LEFT chest that is favored to represent artifact. Given the limited nature of the of the evaluation it is difficult to exclude the possibility of perfusion defect. Consider lower extremity venous sonogram for further assessment as clinically warranted to guide further management. These results will be called to the ordering clinician or representative by the Radiologist Assistant, and communication documented in the PACS or [employer] Electronically Signed   By: GZetta BillsM.D.   On: 06/23/2021 20:51   CT FEMUR RIGHT WO CONTRAST  Result Date: 06/24/2021 CLINICAL DATA:  Right thigh infection. EXAM: CT OF THE RIGHT FEMUR WITHOUT CONTRAST TECHNIQUE: Multidetector CT imaging of the right femur was performed according to the standard protocol. COMPARISON:  CT right femur dated June 19, 2021. FINDINGS: Bones/Joint/Cartilage No bony destruction or periosteal reaction. No fracture or dislocation. Unchanged right hip and knee osteoarthritis. Unchanged small right knee joint  effusion. Ligaments Ligaments are suboptimally evaluated by CT. Muscles and Tendons Grossly intact. Soft tissue Diffuse soft tissue swelling of the right thigh with medial greater than lateral thigh skin thickening is similar to prior study. Scattered subcutaneous dystrophic calcifications are unchanged. No focal fluid collection or subcutaneous emphysema. IMPRESSION: 1. Unchanged diffuse soft tissue swelling of the right thigh with medial greater than lateral thigh skin thickening. No abscess. 2. No acute osseous abnormality. Electronically Signed   By: WTitus DubinM.D.   On: 06/24/2021 11:23   DG CHEST PORT 1 VIEW  Result Date: 06/23/2021 CLINICAL DATA:  History of V/Q scan. EXAM: PORTABLE CHEST 1 VIEW COMPARISON:  June 21, 2021. FINDINGS: Stable cardiomegaly with central pulmonary vascular congestion. No pneumothorax is noted. Mild left basilar atelectasis is noted. Mild right basilar atelectasis and pleural effusion is noted. Bony thorax is unremarkable. IMPRESSION: Bibasilar atelectasis is noted with probable right pleural effusion. Stable cardiomegaly with central pulmonary vascular congestion. Aortic Atherosclerosis (ICD10-I70.0). Electronically Signed   By: JMarijo ConceptionM.D.   On: 06/23/2021 15:29    Labs: BMET Recent Labs  Lab 06/20/21 0911 06/21/21 2254 06/22/21 0454 06/22/21 0934 06/22/21 1400 06/22/21 1414 06/23/21 0153 06/23/21 0954 06/24/21 1427  NA 133* 132*  --  133* 133* 131* 131* 134* 132*  K 3.6 4.8  --  5.0 4.8 4.7 5.1 4.6 3.8  CL 91* 93*  --  97* 97*  --  95* 99 96*  CO2 27 17*  --  21* 23  --  _0 GLUCOSE 119* 152*  --  142* 97  --  79 96 173*  BUN 26* 44*  --  44* 46*  --  52* 55* 42*  CREATININE 6.14* 8.45* 8.60* 8.92* 8.39*  --  9.23* 9.05* 7.51*  CALCIUM 6.8* 7.4*  --  7.6* 7.3*  --  7.7* 7.4* 7.5*  PHOS  --   --   --   --   --   --   --   --  6.0*   CBC Recent Labs  Lab 06/19/21 1225 06/20/21 0610 06/21/21 2130 06/22/21 0454 06/22/21 0934  06/22/21 1414 06/23/21 0153 06/23/21 1912 06/24/21 0254 06/25/21 0409  WBC 7.0   < > 8.8   < > 10.0  --  10.4 8.3 9.0 7.7  NEUTROABS 4.9  --  7.1  --  7.5  --   --   --   --   --   HGB 11.4*   < > 11.5*   < > 11.2*   < > 10.9* 11.6* 10.3* 9.9*  HCT 36.7   < > 37.5   < > 34.9*   < > 33.8* 34.8* 31.4* 30.8*  MCV 98.4   < > 103.3*   < > 98.6  --  97.4 96.4 96.9 98.1  PLT 201   < > 203   < > 181  --  174 151 214 175   < > = values in this interval not displayed.    Medications:     aspirin  81 mg Oral Daily   calcium acetate  667 mg Oral TID WC   Chlorhexidine Gluconate Cloth  6 each Topical Daily   heparin injection (subcutaneous)  5,000 Units Subcutaneous Q8H   mouth rinse  15 mL Mouth Rinse BID   metoprolol succinate  12.5 mg Oral Daily   midodrine  5 mg Oral TID WC      Gean Quint, MD [facility] 06/25/2021, 10:49 AM

## 2021-06-25 NOTE — Progress Notes (Addendum)
Patient ID: [name], female   DOB: 10-10-1966, 55 y.o.   MRN: [id number]     Advanced Heart Failure Rounding Note  PCP-Cardiologist: None   Subjective:    Stable off pressors today, had HD yesterday. No further arrhythmias.   Complaining of left leg/back pain.    Objective:   Weight Range: 130.7 kg Body mass index is 47.95 kg/m.   Vital Signs:   Temp:  [97.5 F (36.4 C)-98.8 F (37.1 C)] 97.6 F (36.4 C) (07/07 0713) Pulse Rate:  [71-102] 74 (07/07 0600) Resp:  [12-25] 18 (07/07 0600) BP: (83-128)/(49-91) 90/53 (07/07 0600) SpO2:  [82 %-98 %] 90 % (07/07 0600) Weight:  [130.7 kg] 130.7 kg (07/07 0400) Last BM Date:  (7/4 per pt.)  Weight change: Filed Weights   06/23/21 1355 06/23/21 1736 06/25/21 0400  Weight: 131.1 kg 130.1 kg 130.7 kg    Intake/Output:   Intake/Output Summary (Last 24 hours) at 06/25/2021 0931 Last data filed at 06/24/2021 1730 Gross per 24 hour  Intake 242.55 ml  Output 68 ml  Net 174.55 ml      Physical Exam    General: NAD Neck: JVP 10 cm, no thyromegaly or thyroid nodule.  Lungs: Clear to auscultation bilaterally with normal respiratory effort. CV: Nondisplaced PMI.  Heart regular S1/S2, no S3/S4, no murmur.  1+ edema to knees.  Abdomen: Soft, nontender, no hepatosplenomegaly, no distention.  Skin: Intact without lesions or rashes.  Neurologic: Alert and oriented x 3.  Psych: Normal affect. Extremities: No clubbing or cyanosis.  HEENT: Normal.    Telemetry   NSR 70s.   EKG    N/A  Labs    CBC Recent Labs    06/22/21 0934 06/22/21 1414 06/24/21 0254 06/25/21 0409  WBC 10.0   < > 9.0 7.7  NEUTROABS 7.5  --   --   --   HGB 11.2*   < > 10.3* 9.9*  HCT 34.9*   < > 31.4* 30.8*  MCV 98.6   < > 96.9 98.1  PLT 181   < > 214 175   < > = values in this interval not displayed.   Basic Metabolic Panel Recent Labs    06/23/21 0954 06/24/21 1427 06/24/21 1939  NA 134* 132*  --   K 4.6 3.8  --   CL 99 96*  --    CO2 22 22  --   GLUCOSE 96 173*  --   BUN 55* 42*  --   CREATININE 9.05* 7.51*  --   CALCIUM 7.4* 7.5*  --   MG  --   --  2.0  PHOS  --  6.0*  --    Liver Function Tests Recent Labs    06/24/21 0254 06/24/21 1427 06/25/21 0409  AST 899*  --  343*  ALT 14  --  9  ALKPHOS 388*  --  351*  BILITOT 1.9*  --  0.9  PROT 5.7*  --  5.8*  ALBUMIN 2.9* 2.8* 2.9*   No results for input(s): LIPASE, AMYLASE in the last 72 hours. Cardiac Enzymes Recent Labs    06/22/21 1400  CKTOTAL 120    BNP: BNP (last 3 results) Recent Labs    06/30/20 0328 06/22/21 1400  BNP 85.0 590.8*    ProBNP (last 3 results) No results for input(s): PROBNP in the last 8760 hours.   D-Dimer Recent Labs    06/22/21 1400  DDIMER 18.87*   Hemoglobin A1C No results for  input(s): HGBA1C in the last 72 hours.  Fasting Lipid Panel No results for input(s): CHOL, HDL, LDLCALC, TRIG, CHOLHDL, LDLDIRECT in the last 72 hours. Thyroid Function Tests No results for input(s): TSH, T4TOTAL, T3FREE, THYROIDAB in the last 72 hours.  Invalid input(s): FREET3  Other results:   Imaging    CT PELVIS WO CONTRAST  Result Date: 06/24/2021 CLINICAL DATA:  Right thigh infection. EXAM: CT PELVIS WITHOUT CONTRAST TECHNIQUE: Multidetector CT imaging of the pelvis was performed following the standard protocol without intravenous contrast. COMPARISON:  CT abdomen pelvis dated June 19, 2021. FINDINGS: Urinary Tract:  No abnormality visualized.  Decompressed bladder. Bowel:  Unremarkable visualized pelvic bowel loops. Vascular/Lymphatic: Aortoiliac atherosclerotic vascular disease. Unchanged mildly enlarged left common iliac lymph node measuring 1.2 cm in short axis. Reproductive: Prior hysterectomy. 3.2 x 2.2 cm cystic lesion in the right ovary is unchanged since the prior study from 5 days ago, but has decreased in size since May 2020, consistent with benign etiology. Other:  None. Musculoskeletal: Mild anasarca. No fluid  collection or subcutaneous emphysema. No acute or significant osseous findings. IMPRESSION: 1. Mild anasarca. No fluid collection. 2. Aortic Atherosclerosis (ICD10-I70.0). Electronically Signed   By: Titus Dubin M.D.   On: 06/24/2021 11:30   CT FEMUR RIGHT WO CONTRAST  Result Date: 06/24/2021 CLINICAL DATA:  Right thigh infection. EXAM: CT OF THE RIGHT FEMUR WITHOUT CONTRAST TECHNIQUE: Multidetector CT imaging of the right femur was performed according to the standard protocol. COMPARISON:  CT right femur dated June 19, 2021. FINDINGS: Bones/Joint/Cartilage No bony destruction or periosteal reaction. No fracture or dislocation. Unchanged right hip and knee osteoarthritis. Unchanged small right knee joint effusion. Ligaments Ligaments are suboptimally evaluated by CT. Muscles and Tendons Grossly intact. Soft tissue Diffuse soft tissue swelling of the right thigh with medial greater than lateral thigh skin thickening is similar to prior study. Scattered subcutaneous dystrophic calcifications are unchanged. No focal fluid collection or subcutaneous emphysema. IMPRESSION: 1. Unchanged diffuse soft tissue swelling of the right thigh with medial greater than lateral thigh skin thickening. No abscess. 2. No acute osseous abnormality. Electronically Signed   By: Titus Dubin M.D.   On: 06/24/2021 11:23     Medications:     Scheduled Medications:  aspirin  81 mg Oral Daily   calcium acetate  667 mg Oral TID WC   Chlorhexidine Gluconate Cloth  6 each Topical Daily   heparin injection (subcutaneous)  5,000 Units Subcutaneous Q8H   mouth rinse  15 mL Mouth Rinse BID   midodrine  5 mg Oral TID WC   pantoprazole (PROTONIX) IV  40 mg Intravenous QHS    Infusions:  sodium chloride     sodium chloride     sodium chloride     sodium chloride     DAPTOmycin (CUBICIN)  IV 500 mg (06/25/21 0913)   norepinephrine (LEVOPHED) Adult infusion 2 mcg/min (06/24/21 0654)    PRN Medications: sodium chloride,  sodium chloride, alteplase, docusate sodium, heparin, lactulose, lidocaine (PF), lidocaine-prilocaine, ondansetron (ZOFRAN) IV, oxyCODONE, pentafluoroprop-tetrafluoroeth, polyethylene glycol    Assessment/Plan   1. Acute on chronic diastolic CHF with prominent RV failure: Echo as above with EF 60-65%, moderate LVH, mod-severe RV dysfunction with severe RVE, PASP 69, mod-severe TR, dilated IVC. This is very similar to prior echo from 2018 with RV looking mildly worse.  She had HD yesterday but remains volume overloaded on exam.  She is off norepinephrine, on midodrine.  - She will need ongoing dialysis for  volume removal.   2. Pulmonary hypertension: RHC in 8/18 showed severe pulmonary hypertension with relatively low PVR (3.0 WU) and high cardiac output. This was thought to be high output PH primarily in setting of excessive flow through the large left arm AVF. She was not thought to be a candidate for selective pulmonary vasodilators.  PA systolic pressure by echo is similar this admission (though mildly lower) to the echo from 2018.  I suspect that the situation is the same, most likely primarily high output pulmonary hypertension and no role for selective pulmonary vasodilators.   - Fluid removal via HD as above. - Would consider ligation of AV fistula, probably when stabilized as outpatient. This may help improve her RV failure.  3. Shock: Suspect primarily septic shock from cellulitis but RV failure likely plays a significant role as well. Resolved, off norepinephrine.  - Continue midodrine 5 mg tid. 4. ID: Cellulitis.  Continue daptomycin per CCM. 5. Rhythm: Patient currently in NSR, has had episodes of what looks like AVNRT versus AVRT and possible atrial tachycardia.  No definite fibrillation or flutter that I have seen. - Reviewed with EP, do not think she needs long-term anticoagulation.  Use [place] heparin prophylaxis.  - Start Toprol XL 12.5 mg daily.  6. Renal: ESRD.    We will sign off,  call with questions.   Loralie Champagne 06/25/2021 9:31 AM

## 2021-06-26 DIAGNOSIS — Z9189 Other specified personal risk factors, not elsewhere classified: Secondary | ICD-10-CM

## 2021-06-26 DIAGNOSIS — R748 Abnormal levels of other serum enzymes: Secondary | ICD-10-CM

## 2021-06-26 DIAGNOSIS — J9601 Acute respiratory failure with hypoxia: Secondary | ICD-10-CM | POA: Diagnosis not present

## 2021-06-26 DIAGNOSIS — R778 Other specified abnormalities of plasma proteins: Secondary | ICD-10-CM

## 2021-06-26 DIAGNOSIS — I5033 Acute on chronic diastolic (congestive) heart failure: Secondary | ICD-10-CM

## 2021-06-26 DIAGNOSIS — A419 Sepsis, unspecified organism: Secondary | ICD-10-CM | POA: Diagnosis not present

## 2021-06-26 DIAGNOSIS — N189 Chronic kidney disease, unspecified: Secondary | ICD-10-CM

## 2021-06-26 DIAGNOSIS — D631 Anemia in chronic kidney disease: Secondary | ICD-10-CM

## 2021-06-26 DIAGNOSIS — R262 Difficulty in walking, not elsewhere classified: Secondary | ICD-10-CM

## 2021-06-26 DIAGNOSIS — I890 Lymphedema, not elsewhere classified: Secondary | ICD-10-CM | POA: Diagnosis not present

## 2021-06-26 DIAGNOSIS — I071 Rheumatic tricuspid insufficiency: Secondary | ICD-10-CM

## 2021-06-26 LAB — HEPATIC FUNCTION PANEL
ALT: 7 U/L (ref 0–44)
AST: 133 U/L — ABNORMAL HIGH (ref 15–41)
Albumin: 2.8 g/dL — ABNORMAL LOW (ref 3.5–5.0)
Alkaline Phosphatase: 344 U/L — ABNORMAL HIGH (ref 38–126)
Bilirubin, Direct: 0.2 mg/dL (ref 0.0–0.2)
Indirect Bilirubin: 0.5 mg/dL (ref 0.3–0.9)
Total Bilirubin: 0.7 mg/dL (ref 0.3–1.2)
Total Protein: 5.8 g/dL — ABNORMAL LOW (ref 6.5–8.1)

## 2021-06-26 LAB — CBC
HCT: 29.7 % — ABNORMAL LOW (ref 36.0–46.0)
Hemoglobin: 9.5 g/dL — ABNORMAL LOW (ref 12.0–15.0)
MCH: 31.8 pg (ref 26.0–34.0)
MCHC: 32 g/dL (ref 30.0–36.0)
MCV: 99.3 fL (ref 80.0–100.0)
Platelets: 172 10*3/uL (ref 150–400)
RBC: 2.99 MIL/uL — ABNORMAL LOW (ref 3.87–5.11)
RDW: 15.1 % (ref 11.5–15.5)
WBC: 7.9 10*3/uL (ref 4.0–10.5)
nRBC: 1.6 % — ABNORMAL HIGH (ref 0.0–0.2)

## 2021-06-26 LAB — CULTURE, BLOOD (ROUTINE X 2)
Culture: NO GROWTH
Culture: NO GROWTH
Special Requests: ADEQUATE

## 2021-06-26 LAB — GLUCOSE, CAPILLARY
Glucose-Capillary: 104 mg/dL — ABNORMAL HIGH (ref 70–99)
Glucose-Capillary: 127 mg/dL — ABNORMAL HIGH (ref 70–99)
Glucose-Capillary: 166 mg/dL — ABNORMAL HIGH (ref 70–99)
Glucose-Capillary: 143 mg/dL — ABNORMAL HIGH (ref 70–99)

## 2021-06-26 MED ORDER — MIDODRINE HCL 5 MG PO TABS
10.0000 mg | ORAL_TABLET | Freq: Three times a day (TID) | ORAL | Status: DC
  Administered 2021-06-26 – 2021-07-02 (×20): 10 mg via ORAL
  Filled 2021-06-26 (×19): qty 2

## 2021-06-26 NOTE — Progress Notes (Signed)
PROGRESS NOTE  [name]:[id number] DOB: November 12, 1966   PCP: Noreene Larsson, NP  Patient is from: Home.  Lives with husband.  Uses wheelchair at baseline.  DOA: 06/21/2021 LOS: 5  Chief complaints:  Chief Complaint  Patient presents with   Hypotension     Brief Narrative / Interim history: 55 year old F with PMH of ESRD on HD MWF, CHF, PAH, CAD, DM-2, HTN, morbid obesity, Chiari malformation, lymphedema, ambulatory dysfunction and recent hospitalization at [facility] from 7/1-7/3 with RLE cellulitis/lymphedema returned to [facility] ED the same day with lethargy and hypotension, and admitted to [facility] ICU with working diagnosis of septic shock and acute on chronic diastolic CHF with RV failure.  Nephrology and advanced heart failure team consulted.  Started on doxycycline, Flagyl and cefepime.  Eventually improved, and came off pressor on 7/6.  Cultures negative.  Transitioned to daptomycin. Transferred to [facility] hospitalist service on 7/8.   Advanced heart failure team signed off.  Nephrology following.    Subjective: Seen and examined earlier this morning.  No major events overnight of this morning.  Feels better today.  No complaints.  She denies chest pain, shortness of breath or GI symptoms.  Objective: Vitals:   06/26/21 0500 06/26/21 0600 06/26/21 0716 06/26/21 1106  BP: (!) 84/51 (!) 90/57    Pulse: 60 64    Resp: 17 14    Temp:   98 F (36.7 C) 97.8 F (36.6 C)  TempSrc:   Oral Axillary  SpO2: 94% 97%    Weight:      Height:        Intake/Output Summary (Last 24 hours) at 06/26/2021 1115 Last data filed at 06/26/2021 0000 Gross per 24 hour  Intake 615 ml  Output --  Net 615 ml   Filed Weights   06/23/21 1355 06/23/21 1736 06/25/21 0400  Weight: 131.1 kg 130.1 kg 130.7 kg    Examination:  GENERAL: No apparent distress.  Nontoxic. HEENT: MMM.  Vision and hearing grossly intact.  NECK: Supple.  No apparent JVD but difficult to assess due to body habitus.Marland Kitchen  RESP: 98% on 4  L.  No IWOB.  Fair aeration but limited exam due to body habitus. CVS:  RRR. Heart sounds normal.  ABD/GI/GU: BS+. Abd soft, NTND but limited exam due to body habitus. MSK/EXT:  Moves extremities.  Extensive lymphedema SKIN: no apparent skin lesion or wound NEURO: Awake, alert and oriented appropriately.  No apparent focal neuro deficit. PSYCH: Calm. Normal affect.   Procedures:  None  Microbiology summarized: [id number] and influenza PCR nonreactive. MRSA PCR screen positive. Blood and urine cultures NGTD  Assessment & Plan: Septic shock likely due to lower extremity cellulitis: POA.  Required vasopressor from 7/3-7/6.  MRSA PCR screen positive.  Cultures negative. -Continue daptomycin to complete course for RLE cellulitis.  CK within normal.  Acute on chronic diastolic CHF with RV failure/PAH: TTE with LVEF of 60 to 65%, moderate LVH, mod-sev RV dysfunction with severe RVE, PASP of 69, mod-sev TVR and dilated IVC -Continue midodrine for BP support -Nephrology managing fluid with dialysis -Advanced heart failure team signed off.  Acute respiratory failure with hypoxia: Likely due to the above 2.  She is also at risk for OHS/OSA given morbid obesity.  -Wean oxygen as able -Encourage incentive spirometry -She could benefit from sleep study  Elevated troponin: Likely demand ischemia in the setting of sepsis and CHF, and delayed clearance from ESRD.  No RWMA on TTE. -Treat sepsis and CHF  as above  Hypotension?  Soft blood pressures but it is from her right forearm.  Not able to check BP on upper arm due AVF on LUE and midline and PIV's on RUE.  She is not symptomatic.  Not tachycardic either. -Continue midodrine  Elevated liver enzymes: Likely due to septic shock.  CK within normal.  Resolving. Recent Labs  Lab 06/22/21 0934 06/23/21 0153 06/24/21 0254 06/24/21 1427 06/25/21 0409 06/26/21 0609  AST 1,893* 1,508* 899*  --  343* 133*  ALT 97* 32 14  --  9 7  ALKPHOS 368* 374*  388*  --  351* 344*  BILITOT 0.6 0.8 1.9*  --  0.9 0.7  PROT 5.8* 5.9* 5.7*  --  5.8* 5.8*  ALBUMIN 2.8* 2.9* 2.9* 2.8* 2.9* 2.8*  -Continue monitoring  ESRD on HD Anemia of renal disease -Per nephrology.  Tachycardia-resolved. -Started on low-dose metoprolol by cardiology  Lymphedema/ambulatory dysfunction-wheelchair dependent at baseline. -PT/OT eval to assess for ability to transfer  Morbid obesity Body mass index is 47.95 kg/m.         DVT prophylaxis:  heparin injection 5,000 Units Start: 06/24/21 1400 SCDs Start: 06/22/21 0149  Code Status: Full code Family Communication: Updated patient's husband over the phone with patient's request Level of care: Telemetry Cardiac Status is: Inpatient  Remains inpatient appropriate because:Hemodynamically unstable, Unsafe d/c plan, IV treatments appropriate due to intensity of illness or inability to take PO, and Inpatient level of care appropriate due to severity of illness  Dispo: The patient is from: Home              Anticipated d/c is to:  To be determined              Patient currently is not medically stable to d/c.   Difficult to place patient No       Consultants:  Critical care Advanced heart failure team Nephrology   Sch Meds:  Scheduled Meds:  aspirin  81 mg Oral Daily   calcium acetate  667 mg Oral TID WC   Chlorhexidine Gluconate Cloth  6 each Topical Daily   heparin injection (subcutaneous)  5,000 Units Subcutaneous Q8H   mouth rinse  15 mL Mouth Rinse BID   metoprolol succinate  12.5 mg Oral Daily   midodrine  10 mg Oral TID WC   Continuous Infusions:  sodium chloride     sodium chloride     sodium chloride     sodium chloride     DAPTOmycin (CUBICIN)  IV Stopped (06/25/21 0943)   PRN Meds:.sodium chloride, sodium chloride, alteplase, docusate sodium, heparin, lactulose, lidocaine (PF), lidocaine-prilocaine, ondansetron (ZOFRAN) IV, oxyCODONE, pentafluoroprop-tetrafluoroeth, polyethylene  glycol  Antimicrobials: Anti-infectives (From admission, onward)    Start     Dose/Rate Route Frequency Ordered Stop   06/23/21 0930  DAPTOmycin (CUBICIN) 500 mg in sodium chloride 0.9 % IVPB        6 mg/kg  86.2 kg (Adjusted) 120 mL/hr over 30 Minutes Intravenous Every 48 hours 06/23/21 0831 06/27/21 2359   06/23/21 0915  DAPTOmycin (CUBICIN) 800 mg in sodium chloride 0.9 % IVPB  Status:  Discontinued        6 mg/kg  130.1 kg 132 mL/hr over 30 Minutes Intravenous Every 48 hours 06/23/21 0825 06/23/21 0831   06/22/21 2200  ceFEPIme (MAXIPIME) 1 g in sodium chloride 0.9 % 100 mL IVPB  Status:  Discontinued        1 g 200 mL/hr over 30 Minutes Intravenous  Every 24 hours 06/22/21 0001 06/23/21 0825   06/22/21 0800  metroNIDAZOLE (FLAGYL) IVPB 500 mg  Status:  Discontinued        500 mg 100 mL/hr over 60 Minutes Intravenous Every 8 hours 06/22/21 0716 06/23/21 0825   06/21/21 2230  doxycycline (VIBRAMYCIN) 100 mg in sodium chloride 0.9 % 250 mL IVPB  Status:  Discontinued        100 mg 125 mL/hr over 120 Minutes Intravenous Every 12 hours 06/21/21 2223 06/23/21 0825   06/21/21 2215  ceFEPIme (MAXIPIME) 2 g in sodium chloride 0.9 % 100 mL IVPB        2 g 200 mL/hr over 30 Minutes Intravenous  Once 06/21/21 2208 06/21/21 2245   06/21/21 2215  metroNIDAZOLE (FLAGYL) IVPB 500 mg        500 mg 100 mL/hr over 60 Minutes Intravenous  Once 06/21/21 2208 06/21/21 2340        I have personally reviewed the following labs and images: CBC: Recent Labs  Lab 06/19/21 1225 06/20/21 0610 06/21/21 2130 06/22/21 0454 06/22/21 0934 06/22/21 1414 06/23/21 0153 06/23/21 1912 06/24/21 0254 06/25/21 0409 06/26/21 0609  WBC 7.0   < > 8.8   < > 10.0  --  10.4 8.3 9.0 7.7 7.9  NEUTROABS 4.9  --  7.1  --  7.5  --   --   --   --   --   --   HGB 11.4*   < > 11.5*   < > 11.2*   < > 10.9* 11.6* 10.3* 9.9* 9.5*  HCT 36.7   < > 37.5   < > 34.9*   < > 33.8* 34.8* 31.4* 30.8* 29.7*  MCV 98.4   < >  103.3*   < > 98.6  --  97.4 96.4 96.9 98.1 99.3  PLT 201   < > 203   < > 181  --  174 151 214 175 172   < > = values in this interval not displayed.   BMP &GFR Recent Labs  Lab 06/22/21 0934 06/22/21 1400 06/22/21 1414 06/23/21 0153 06/23/21 0954 06/24/21 1427 06/24/21 1939  NA 133* 133* 131* 131* 134* 132*  --   K 5.0 4.8 4.7 5.1 4.6 3.8  --   CL 97* 97*  --  95* 99 96*  --   CO2 21* 23  --  _0 --   GLUCOSE 142* 97  --  79 96 173*  --   BUN 44* 46*  --  52* 55* 42*  --   CREATININE 8.92* 8.39*  --  9.23* 9.05* 7.51*  --   CALCIUM 7.6* 7.3*  --  7.7* 7.4* 7.5*  --   MG  --   --   --   --   --   --  2.0  PHOS  --   --   --   --   --  6.0*  --    Estimated Creatinine Clearance: 11.7 mL/min (A) (by C-G formula based on SCr of 7.51 mg/dL (H)). Liver & Pancreas: Recent Labs  Lab 06/22/21 0934 06/23/21 0153 06/24/21 0254 06/24/21 1427 06/25/21 0409 06/26/21 0609  AST 1,893* 1,508* 899*  --  343* 133*  ALT 97* 32 14  --  9 7  ALKPHOS 368* 374* 388*  --  351* 344*  BILITOT 0.6 0.8 1.9*  --  0.9 0.7  PROT 5.8* 5.9* 5.7*  --  5.8* 5.8*  ALBUMIN 2.8*  2.9* 2.9* 2.8* 2.9* 2.8*   No results for input(s): LIPASE, AMYLASE in the last 168 hours. No results for input(s): AMMONIA in the last 168 hours. Diabetic: No results for input(s): HGBA1C in the last 72 hours. Recent Labs  Lab 06/25/21 1117 06/25/21 1701 06/25/21 2139 06/26/21 0714 06/26/21 1104  GLUCAP 112* 142* 150* 104* 127*   Cardiac Enzymes: Recent Labs  Lab 06/22/21 1400  CKTOTAL 120   No results for input(s): PROBNP in the last 8760 hours. Coagulation Profile: Recent Labs  Lab 06/19/21 1815 06/23/21 0153  INR 1.1 1.9*   Thyroid Function Tests: No results for input(s): TSH, T4TOTAL, FREET4, T3FREE, THYROIDAB in the last 72 hours. Lipid Profile: No results for input(s): CHOL, HDL, LDLCALC, TRIG, CHOLHDL, LDLDIRECT in the last 72 hours. Anemia Panel: No results for input(s): VITAMINB12, FOLATE,  FERRITIN, TIBC, IRON, RETICCTPCT in the last 72 hours. Urine analysis:    Component Value Date/Time   COLORURINE YELLOW 08/11/2016 1800   APPEARANCEUR HAZY (A) 08/11/2016 1800   LABSPEC 1.020 08/11/2016 1800   PHURINE 5.5 08/11/2016 1800   GLUCOSEU NEGATIVE 08/11/2016 1800   HGBUR MODERATE (A) 08/11/2016 1800   BILIRUBINUR NEGATIVE 08/11/2016 1800   KETONESUR NEGATIVE 08/11/2016 1800   PROTEINUR 100 (A) 08/11/2016 1800   UROBILINOGEN 0.2 12/02/2014 1925   NITRITE NEGATIVE 08/11/2016 1800   LEUKOCYTESUR NEGATIVE 08/11/2016 1800   Sepsis Labs: Invalid input(s): PROCALCITONIN, Joanna  Microbiology: Recent Results (from the past 240 hour(s))  Culture, blood (routine x 2)     Status: None   Collection Time: 06/19/21  2:38 PM   Specimen: BLOOD  Result Value Ref Range Status   Specimen Description BLOOD RIGHT ARM  Final   Special Requests   Final    BOTTLES DRAWN AEROBIC AND ANAEROBIC Blood Culture adequate volume   Culture   Final    NO GROWTH 5 DAYS Performed at [facility], [address] [place], [place] [address]    Report Status 06/24/2021 FINAL  Final  Resp Panel by RT-PCR (Flu A&B, Covid) Nasopharyngeal Swab     Status: None   Collection Time: 06/19/21  6:22 PM   Specimen: Nasopharyngeal Swab; Nasopharyngeal(NP) swabs in vial transport medium  Result Value Ref Range Status   SARS Coronavirus 2 by RT PCR NEGATIVE NEGATIVE Final    Comment: (NOTE) SARS-CoV-2 target nucleic acids are NOT DETECTED.  The SARS-CoV-2 RNA is generally detectable in upper respiratory specimens during the acute phase of infection. The lowest concentration of SARS-CoV-2 viral copies this assay can detect is 138 copies/mL. A negative result does not preclude SARS-Cov-2 infection and should not be used as the sole basis for treatment or other patient management decisions. A negative result may occur with  improper specimen collection/handling, submission of specimen other than  nasopharyngeal swab, presence of viral mutation(s) within the areas targeted by this assay, and inadequate number of viral copies(<138 copies/mL). A negative result must be combined with clinical observations, patient history, and epidemiological information. The expected result is Negative.  Fact Sheet for Patients:  EntrepreneurPulse.com.au  Fact Sheet for Healthcare Providers:  IncredibleEmployment.be  This test is no t yet approved or cleared by the [place] FDA and  has been authorized for detection and/or diagnosis of SARS-CoV-2 by FDA under an Emergency Use Authorization (EUA). This EUA will remain  in effect (meaning this test can be used) for the duration of the COVID-19 declaration under Section 564(b)(1) of the Act, 21 U.S.C.section 360bbb-3(b)(1), unless the authorization is  terminated  or revoked sooner.       Influenza A by PCR NEGATIVE NEGATIVE Final   Influenza B by PCR NEGATIVE NEGATIVE Final    Comment: (NOTE) The Xpert Xpress SARS-CoV-2/FLU/RSV plus assay is intended as an aid in the diagnosis of influenza from Nasopharyngeal swab specimens and should not be used as a sole basis for treatment. Nasal washings and aspirates are unacceptable for Xpert Xpress SARS-CoV-2/FLU/RSV testing.  Fact Sheet for Patients: EntrepreneurPulse.com.au  Fact Sheet for Healthcare Providers: IncredibleEmployment.be  This test is not yet approved or cleared by the [place] FDA and has been authorized for detection and/or diagnosis of SARS-CoV-2 by FDA under an Emergency Use Authorization (EUA). This EUA will remain in effect (meaning this test can be used) for the duration of the COVID-19 declaration under Section 564(b)(1) of the Act, 21 U.S.C. section 360bbb-3(b)(1), unless the authorization is terminated or revoked.  Performed at [facility], [address] [place], [place] [address]   MRSA  Next Gen by PCR, Nasal     Status: Abnormal   Collection Time: 06/19/21  7:24 PM   Specimen: Nasal Mucosa; Nasal Swab  Result Value Ref Range Status   MRSA by PCR Next Gen DETECTED (A) NOT DETECTED Final    Comment: RESULT CALLED TO, READ BACK BY AND VERIFIED WITH: BROWN,B @ 2230 ON 06/19/21 BY JUW (NOTE) The GeneXpert MRSA Assay (FDA approved for NASAL specimens only), is one component of a comprehensive MRSA colonization surveillance program. It is not intended to diagnose MRSA infection nor to guide or monitor treatment for MRSA infections. Test performance is not FDA approved in patients less than 70 years old. Performed at [facility], [address], [place], [place] [address]   Culture, blood (routine x 2)     Status: None   Collection Time: 06/21/21  9:30 PM   Specimen: BLOOD RIGHT FOREARM  Result Value Ref Range Status   Specimen Description BLOOD RIGHT FOREARM  Final   Special Requests   Final    Blood Culture results may not be optimal due to an excessive volume of blood received in culture bottles BOTTLES DRAWN AEROBIC AND ANAEROBIC   Culture   Final    NO GROWTH 5 DAYS Performed at [facility], [address], [place], [place] [address]    Report Status 06/26/2021 FINAL  Final  Culture, blood (routine x 2)     Status: None   Collection Time: 06/21/21  9:53 PM   Specimen: BLOOD RIGHT HAND  Result Value Ref Range Status   Specimen Description BLOOD RIGHT HAND  Final   Special Requests   Final    BOTTLES DRAWN AEROBIC ONLY Blood Culture adequate volume   Culture   Final    NO GROWTH 5 DAYS Performed at [facility], [address] [place], [place] [address]    Report Status 06/26/2021 FINAL  Final  Resp Panel by RT-PCR (Flu A&B, Covid) Nasopharyngeal Swab     Status: None   Collection Time: 06/21/21 10:40 PM   Specimen: Nasopharyngeal Swab; Nasopharyngeal(NP) swabs in vial transport medium  Result Value Ref Range Status   SARS Coronavirus 2 by RT PCR NEGATIVE  NEGATIVE Final    Comment: (NOTE) SARS-CoV-2 target nucleic acids are NOT DETECTED.  The SARS-CoV-2 RNA is generally detectable in upper respiratory specimens during the acute phase of infection. The lowest concentration of SARS-CoV-2 viral copies this assay can detect is 138 copies/mL. A negative result does not preclude SARS-Cov-2 infection  and should not be used as the sole basis for treatment or other patient management decisions. A negative result may occur with  improper specimen collection/handling, submission of specimen other than nasopharyngeal swab, presence of viral mutation(s) within the areas targeted by this assay, and inadequate number of viral copies(<138 copies/mL). A negative result must be combined with clinical observations, patient history, and epidemiological information. The expected result is Negative.  Fact Sheet for Patients:  EntrepreneurPulse.com.au  Fact Sheet for Healthcare Providers:  IncredibleEmployment.be  This test is no t yet approved or cleared by the [place] FDA and  has been authorized for detection and/or diagnosis of SARS-CoV-2 by FDA under an Emergency Use Authorization (EUA). This EUA will remain  in effect (meaning this test can be used) for the duration of the COVID-19 declaration under Section 564(b)(1) of the Act, 21 U.S.C.section 360bbb-3(b)(1), unless the authorization is terminated  or revoked sooner.       Influenza A by PCR NEGATIVE NEGATIVE Final   Influenza B by PCR NEGATIVE NEGATIVE Final    Comment: (NOTE) The Xpert Xpress SARS-CoV-2/FLU/RSV plus assay is intended as an aid in the diagnosis of influenza from Nasopharyngeal swab specimens and should not be used as a sole basis for treatment. Nasal washings and aspirates are unacceptable for Xpert Xpress SARS-CoV-2/FLU/RSV testing.  Fact Sheet for Patients: EntrepreneurPulse.com.au  Fact Sheet for Healthcare  Providers: IncredibleEmployment.be  This test is not yet approved or cleared by the [place] FDA and has been authorized for detection and/or diagnosis of SARS-CoV-2 by FDA under an Emergency Use Authorization (EUA). This EUA will remain in effect (meaning this test can be used) for the duration of the COVID-19 declaration under Section 564(b)(1) of the Act, 21 U.S.C. section 360bbb-3(b)(1), unless the authorization is terminated or revoked.  Performed at [facility], [address], [place], [place] [address]   MRSA Next Gen by PCR, Nasal     Status: Abnormal   Collection Time: 06/22/21  1:35 AM   Specimen: Nasal Mucosa; Nasal Swab  Result Value Ref Range Status   MRSA by PCR Next Gen DETECTED (A) NOT DETECTED Final    Comment: RESULT CALLED TO, READ BACK BY AND VERIFIED WITH: WARNER,B RN AT 0350 06/22/2021 MITCHELL,L (NOTE) The GeneXpert MRSA Assay (FDA approved for NASAL specimens only), is one component of a comprehensive MRSA colonization surveillance program. It is not intended to diagnose MRSA infection nor to guide or monitor treatment for MRSA infections. Test performance is not FDA approved in patients less than 76 years old. Performed at [facility], [place] [address], [place], [place] [address]   Culture, blood (routine x 2)     Status: None (Preliminary result)   Collection Time: 06/22/21  9:34 AM   Specimen: BLOOD RIGHT HAND  Result Value Ref Range Status   Specimen Description BLOOD RIGHT HAND  Final   Special Requests   Final    BOTTLES DRAWN AEROBIC ONLY Blood Culture adequate volume   Culture   Final    NO GROWTH 4 DAYS Performed at [facility], [place] [address] [place], [place] [address]    Report Status PENDING  Incomplete  Culture, blood (routine x 2)     Status: None (Preliminary result)   Collection Time: 06/22/21  9:35 AM   Specimen: BLOOD LEFT HAND  Result Value Ref Range Status   Specimen Description BLOOD LEFT HAND   Final   Special Requests   Final  BOTTLES DRAWN AEROBIC ONLY Blood Culture results may not be optimal due to an inadequate volume of blood received in culture bottles   Culture   Final    NO GROWTH 4 DAYS Performed at [facility], [place] [address], [place], [place] [address]    Report Status PENDING  Incomplete    Radiology Studies: No results found.    [name] T. [place]  If 7PM-7AM, please contact night-coverage [email or web address] 06/26/2021, 11:15 AM

## 2021-06-26 NOTE — Progress Notes (Signed)
[facility] Progress Note    Assessment/ Plan:   # ESRD: Outpatient orders: [facility], Monday Wednesday Friday, 255 minutes.  250 BFR/500 DFR of the refill of her medications..  1K, 2.5 calcium, 138 sodium, EDW 135.5 kg, 15-gauge needles, machine temp 37 -hd today. Will plan for another treatment tomorrow (sequential treatment/dry UF) for further UF   Acute on chronic RV failure with shock Sepsis, septic shock likely secondary to sepsis -abx per ccm/primary, off pressors -possible that her AVF is a contributing factor, will need to have this looked as an outpatient unless she is not making any improvements then would need vascular surgery to evaluate her for possible ligation -HF following -on midodrine, increasing today to 42m tid   Lactic acidosis, metabolic acidosis -off nahco3 gtt, improved   Volume/ hypertension: EDW 135.5kg. Will UF as tolerated   Anemia of Chronic Kidney Disease: Currently receiving epo 12k units and venofer 550m Holding on IV iron   Secondary Hyperparathyroidism/Hyperphosphatemia: sensipar 60 qtx    Vascular access: lue avf +b/t. Will likely need this ligated as an oupatient   DM2 with hyperglycemia: mgmt per primary service   # Additional recommendations: - Dose all meds for creatinine clearance < 10 ml/min  - Unless absolutely necessary, no MRIs with gadolinium.  - Implement save arm precautions.  Prefer needle sticks in the dorsum of the hands or wrists.  No blood pressure measurements in arm. - If blood transfusion is requested during hemodialysis sessions, please alert [facility] to the session. - If a hemodialysis catheter line culture is requested, please alert [place] only hemodialysis nurses are able to collect those specimens.   ViGean QuintMD [facility]  Subjective:   No acute events, remains hemodynamically stable. Open for HD today.    Objective:   BP (!) 90/57   Pulse 64   Temp 98 F (36.7 C)  (Oral)   Resp 14   Ht _0  (1.651 m)   Wt 130.7 kg   SpO2 97%   BMI 47.95 kg/m   Intake/Output Summary (Last 24 hours) at 06/26/2021 1047 Last data filed at 06/26/2021 0000 Gross per 24 hour  Intake 615 ml  Output --  Net 615 ml   Weight change:   Physical Exam: Gen:nad, sitting up in bed CVS:s1s2, rrr Resp:cta bl AbMWU:XLKGMsoft, nt ExWNU:UVOZDGUYQIilateral LE's w/ chronic skin changes Neuro: awake, alert, moves all ext spontaneously, speech clear and coherent HD access: lue avf +b/t  Imaging: CT PELVIS WO CONTRAST  Result Date: 06/24/2021 CLINICAL DATA:  Right thigh infection. EXAM: CT PELVIS WITHOUT CONTRAST TECHNIQUE: Multidetector CT imaging of the pelvis was performed following the standard protocol without intravenous contrast. COMPARISON:  CT abdomen pelvis dated June 19, 2021. FINDINGS: Urinary Tract:  No abnormality visualized.  Decompressed bladder. Bowel:  Unremarkable visualized pelvic bowel loops. Vascular/Lymphatic: Aortoiliac atherosclerotic vascular disease. Unchanged mildly enlarged left common iliac lymph node measuring 1.2 cm in short axis. Reproductive: Prior hysterectomy. 3.2 x 2.2 cm cystic lesion in the right ovary is unchanged since the prior study from 5 days ago, but has decreased in size since May 2020, consistent with benign etiology. Other:  None. Musculoskeletal: Mild anasarca. No fluid collection or subcutaneous emphysema. No acute or significant osseous findings. IMPRESSION: 1. Mild anasarca. No fluid collection. 2. Aortic Atherosclerosis (ICD10-I70.0). Electronically Signed   By: WiTitus Dubin.D.   On: 06/24/2021 11:30   CT FEMUR RIGHT WO CONTRAST  Result Date: 06/24/2021 CLINICAL DATA:  Right thigh infection. EXAM: CT OF THE RIGHT FEMUR WITHOUT CONTRAST TECHNIQUE: Multidetector CT imaging of the right femur was performed according to the standard protocol. COMPARISON:  CT right femur dated June 19, 2021. FINDINGS: Bones/Joint/Cartilage No bony  destruction or periosteal reaction. No fracture or dislocation. Unchanged right hip and knee osteoarthritis. Unchanged small right knee joint effusion. Ligaments Ligaments are suboptimally evaluated by CT. Muscles and Tendons Grossly intact. Soft tissue Diffuse soft tissue swelling of the right thigh with medial greater than lateral thigh skin thickening is similar to prior study. Scattered subcutaneous dystrophic calcifications are unchanged. No focal fluid collection or subcutaneous emphysema. IMPRESSION: 1. Unchanged diffuse soft tissue swelling of the right thigh with medial greater than lateral thigh skin thickening. No abscess. 2. No acute osseous abnormality. Electronically Signed   By: Titus Dubin M.D.   On: 06/24/2021 11:23    Labs: BMET Recent Labs  Lab 06/20/21 0911 06/21/21 2254 06/22/21 0454 06/22/21 0934 06/22/21 1400 06/22/21 1414 06/23/21 0153 06/23/21 0954 06/24/21 1427  NA 133* 132*  --  133* 133* 131* 131* 134* 132*  K 3.6 4.8  --  5.0 4.8 4.7 5.1 4.6 3.8  CL 91* 93*  --  97* 97*  --  95* 99 96*  CO2 27 17*  --  21* 23  --  _0 GLUCOSE 119* 152*  --  142* 97  --  79 96 173*  BUN 26* 44*  --  44* 46*  --  52* 55* 42*  CREATININE 6.14* 8.45* 8.60* 8.92* 8.39*  --  9.23* 9.05* 7.51*  CALCIUM 6.8* 7.4*  --  7.6* 7.3*  --  7.7* 7.4* 7.5*  PHOS  --   --   --   --   --   --   --   --  6.0*   CBC Recent Labs  Lab 06/19/21 1225 06/20/21 0610 06/21/21 2130 06/22/21 0454 06/22/21 0934 06/22/21 1414 06/23/21 1912 06/24/21 0254 06/25/21 0409 06/26/21 0609  WBC 7.0   < > 8.8   < > 10.0   < > 8.3 9.0 7.7 7.9  NEUTROABS 4.9  --  7.1  --  7.5  --   --   --   --   --   HGB 11.4*   < > 11.5*   < > 11.2*   < > 11.6* 10.3* 9.9* 9.5*  HCT 36.7   < > 37.5   < > 34.9*   < > 34.8* 31.4* 30.8* 29.7*  MCV 98.4   < > 103.3*   < > 98.6   < > 96.4 96.9 98.1 99.3  PLT 201   < > 203   < > 181   < > 151 214 175 172   < > = values in this interval not displayed.     Medications:     aspirin  81 mg Oral Daily   calcium acetate  667 mg Oral TID WC   Chlorhexidine Gluconate Cloth  6 each Topical Daily   heparin injection (subcutaneous)  5,000 Units Subcutaneous Q8H   mouth rinse  15 mL Mouth Rinse BID   metoprolol succinate  12.5 mg Oral Daily   midodrine  10 mg Oral TID WC      Gean Quint, MD [facility] 06/26/2021, 10:47 AM

## 2021-06-26 NOTE — Progress Notes (Signed)
NEW ADMISSION NOTE New Admission Note:   Arrival Method: Patient transferred from 59M Mental Orientation: alert and oriented x 4. Telemetry: 68M-17 NSR Assessment: Completed Skin: dry and flaky IV: see the IV LDA Pain: denies any pain. Tubes: N/A Safety Measures: Safety Fall Prevention Plan has been given, discussed and signed Admission: Completed [facility] Orientation: Patient has been orientated to the room, unit and staff.    Orders have been reviewed and implemented. Will continue to monitor the patient. Call light has been placed within reach and bed alarm has been activated.   Amaryllis Dyke, RN

## 2021-06-26 NOTE — Progress Notes (Signed)
Occupational Therapy Treatment Patient Details Name: [name] MRN: [id number] DOB: 05/09/1966 Today's Date: 06/26/2021    History of present illness 55 y.o. female presenting from [facility] ED 7/3 with lethargy, hypotension and elevated lactic acid after d/c from [facility] earlier that day. Admitted at [facility] several days ago with cellulitis, given IV antibiotics and d/c'd home secondary to passing of father. This admission patient found to have AMS, septic shock, acute respiratory failure, acute on chronic RV failure.  PMHX significant for obesity, DMII, COPD, CAD, lymphedema, ESRD on HD MWF and CHF.   OT comments  Patient reporting moderate pain in R hip and low back this date but in agreement with OT treatment session. Session with focus on grooming seated EOB, bed mobility, strengthening, and pre-standing activities with use of Stedy (too narrow for patient to attempt full sit to stand transfers with). Patient remains highly motivated. OT will continue to follow acutely.    Follow Up Recommendations  SNF;Supervision/Assistance - 24 hour    Equipment Recommendations  Other (comment) (Defer to next level of care.)    Recommendations for Other Services      Precautions / Restrictions Precautions Precautions: Fall Precaution Comments: Monitor SpO2 Restrictions Weight Bearing Restrictions: No       Mobility Bed Mobility Overal bed mobility: Needs Assistance Bed Mobility: Supine to Sit;Sit to Supine     Supine to sit: Min assist;HOB elevated Sit to supine: Max assist (+3 assist required for return to supine)   General bed mobility comments: Increased assist required for supine to EOB this date given increased edema in BLE. At EOB patient able to complete anterior scoots but unable to scoot hips posteriorly or laterally requiring +3 assist for return to supine with use of chuck pad.    Transfers Overall transfer level: Needs assistance               General transfer comment:  Patient able to lift buttocks from bed surface a few inches x several trials pulling on bar of stedy. Bariatric stedy too small to attempt full sit to stand transfers.    Balance Overall balance assessment: Needs assistance Sitting-balance support: Single extremity supported;Bilateral upper extremity supported;Feet supported Sitting balance-Leahy Scale: Fair Sitting balance - Comments: Able to maintain static sitting balance at EOB with and without unilateral UE support.                                   ADL either performed or assessed with clinical judgement   ADL Overall ADL's : Needs assistance/impaired     Grooming: Set up;Sitting Grooming Details (indicate cue type and reason): 2/3 grooming tasks seated EOB with set-up.             Lower Body Dressing: Total assistance Lower Body Dressing Details (indicate cue type and reason): Total A to don footwear in supine.               General ADL Comments: Patient continues to be limited by BLE edema, generalized weakness, inability to stand.     Vision       Perception     Praxis      Cognition Arousal/Alertness: Awake/alert Behavior During Therapy: WFL for tasks assessed/performed Overall Cognitive Status: Within Functional Limits for tasks assessed  Exercises     Shoulder Instructions       General Comments 2L O2 with sats 89-93% throughout session. Poor pleth at times with sats in 70's.    Pertinent Vitals/ Pain       Pain Assessment: 0-10 Pain Score: 6  Pain Location: R hip and low back Pain Descriptors / Indicators: Aching;Sore Pain Intervention(s): Limited activity within patient's tolerance;Monitored during session;Repositioned;Patient requesting pain meds-RN notified  Home Living                                          Prior Functioning/Environment              Frequency  Min 2X/week         Progress Toward Goals  OT Goals(current goals can now be found in the care plan section)  Progress towards OT goals: Progressing toward goals  Acute Rehab OT Goals Patient Stated Goal: To return home with husband and granddaughter. OT Goal Formulation: With patient Time For Goal Achievement: 07/08/21 Potential to Achieve Goals: Fair ADL Goals Pt Will Perform Grooming: with set-up;sitting Pt Will Perform Upper Body Dressing: with set-up;sitting Pt Will Transfer to Toilet: with mod assist;bedside commode;ambulating Pt Will Perform Toileting - Clothing Manipulation and hygiene: with mod assist;sitting/lateral leans;with adaptive equipment Pt/caregiver will Perform Home Exercise Program: Increased ROM;Increased strength;Both right and left upper extremity;With theraputty Additional ADL Goal #1: Patient will tolerate 8-10 minutes of therapeutic activity in prep for return to PLOF.  Plan Discharge plan remains appropriate;Frequency remains appropriate    Co-evaluation                 AM-PAC OT "6 Clicks" Daily Activity     Outcome Measure   Help from another person eating meals?: None Help from another person taking care of personal grooming?: A Little Help from another person toileting, which includes using toliet, bedpan, or urinal?: Total Help from another person bathing (including washing, rinsing, drying)?: Total Help from another person to put on and taking off regular upper body clothing?: A Little Help from another person to put on and taking off regular lower body clothing?: Total 6 Click Score: 13    End of Session Equipment Utilized During Treatment: Gait belt;Oxygen  OT Visit Diagnosis: Other abnormalities of gait and mobility (R26.89);Muscle weakness (generalized) (M62.81);Pain Pain - Right/Left: Right Pain - part of body: Leg;Ankle and joints of foot (Low back)   Activity Tolerance Patient tolerated treatment well   Patient Left in bed;with call bell/phone  within reach;with bed alarm set   Nurse Communication Mobility status;Other (comment);Patient requests pain meds (Response to treatment)        Time: 8001-2393 OT Time Calculation (min): 34 min  Charges: OT General Charges $OT Visit: 1 Visit OT Treatments $Therapeutic Activity: 23-37 mins  Derald Lorge H. OTR/L Supplemental OT, Department of rehab services [phone]   Nameer Summer R H. 06/26/2021, 1:28 PM

## 2021-06-26 NOTE — Progress Notes (Signed)
Pharmacy Antibiotic Note  [name] is a 55 y.o. female admitted on 06/21/2021 with cellulitis.  Pharmacy has been consulted for Daptomycin dosing. ESRD on HD. Patient afebrile. WBC WNL.   Plan: Continue daptomycin 500 mg (6 mg/kg x Adj BW) IV q48hr. Last dose will be 7/9. Pharmacy signing off. Thank you for the consult.   Height: _0  (165.1 cm) Weight: 130.7 kg (288 lb 2.3 oz) IBW/kg (Calculated) : 57  Temp (24hrs), Avg:97.9 F (36.6 C), Min:97.7 F (36.5 C), Max:98.2 F (36.8 C)  Recent Labs  Lab 06/19/21 1446 06/20/21 0610 06/21/21 2130 06/21/21 2254 06/21/21 2258 06/22/21 0454 06/22/21 0934 06/22/21 1400 06/23/21 0153 06/23/21 0954 06/23/21 1912 06/24/21 0254 06/24/21 1427 06/25/21 0409 06/26/21 0609  WBC  --    < > 8.8  --   --    < > 10.0  --  10.4  --  8.3 9.0  --  7.7 7.9  CREATININE  --    < >  --    < >  --    < > 8.92* 8.39* 9.23* 9.05*  --   --  7.51*  --   --   LATICACIDVEN 1.4  --  9.5*  --  7.6*  --  2.4*  --   --   --   --   --   --   --   --    < > = values in this interval not displayed.     Estimated Creatinine Clearance: 11.7 mL/min (A) (by C-G formula based on SCr of 7.51 mg/dL (H)).    Allergies  Allergen Reactions   Contrast Media [Iodinated Diagnostic Agents] Anaphylaxis, Hives, Swelling and Other (See Comments)    Dye for cardiac cath. Tongue swells   Pneumococcal Vaccines Swelling and Other (See Comments)    Turns skin black, and bodily swelling   Vancomycin Nausea And Vomiting and Other (See Comments)    Infusion "made me feel like I was dying" had to be readmitted to hospital    Cefazolin 7/1 >> 7/3 Clinda 7/1 >> 7/3 Cefepime 7/3 >>7/5 Doxy 7/3 >>7/5 Flagyl 7/3 >>7/5 Cubicin 7/5 >> (7/9)  7/4 CK = 120  7/3 MRSA PCR - positive 7/3 BCx - Negative 7/4 BCx - NGTD  Donald Pore, PharmD Pharmacy Resident 06/26/2021, 10:49 AM

## 2021-06-27 DIAGNOSIS — J9601 Acute respiratory failure with hypoxia: Secondary | ICD-10-CM | POA: Diagnosis not present

## 2021-06-27 DIAGNOSIS — I890 Lymphedema, not elsewhere classified: Secondary | ICD-10-CM | POA: Diagnosis not present

## 2021-06-27 DIAGNOSIS — A419 Sepsis, unspecified organism: Secondary | ICD-10-CM | POA: Diagnosis not present

## 2021-06-27 LAB — HEPATIC FUNCTION PANEL
ALT: 6 U/L (ref 0–44)
AST: 69 U/L — ABNORMAL HIGH (ref 15–41)
Albumin: 2.7 g/dL — ABNORMAL LOW (ref 3.5–5.0)
Alkaline Phosphatase: 326 U/L — ABNORMAL HIGH (ref 38–126)
Bilirubin, Direct: <0.1 mg/dL (ref 0.0–0.2)
Total Bilirubin: 0.5 mg/dL (ref 0.3–1.2)
Total Protein: 5.6 g/dL — ABNORMAL LOW (ref 6.5–8.1)

## 2021-06-27 LAB — GLUCOSE, CAPILLARY
Glucose-Capillary: 116 mg/dL — ABNORMAL HIGH (ref 70–99)
Glucose-Capillary: 143 mg/dL — ABNORMAL HIGH (ref 70–99)
Glucose-Capillary: 134 mg/dL — ABNORMAL HIGH (ref 70–99)

## 2021-06-27 LAB — RENAL FUNCTION PANEL
Albumin: 2.7 g/dL — ABNORMAL LOW (ref 3.5–5.0)
Anion gap: 9 (ref 5–15)
BUN: 38 mg/dL — ABNORMAL HIGH (ref 6–20)
CO2: 25 mmol/L (ref 22–32)
Calcium: 8.1 mg/dL — ABNORMAL LOW (ref 8.9–10.3)
Chloride: 99 mmol/L (ref 98–111)
Creatinine, Ser: 7.81 mg/dL — ABNORMAL HIGH (ref 0.44–1.00)
GFR, Estimated: 6 mL/min — ABNORMAL LOW (ref >60)
Glucose, Bld: 141 mg/dL — ABNORMAL HIGH (ref 70–99)
Phosphorus: 5.1 mg/dL — ABNORMAL HIGH (ref 2.5–4.6)
Potassium: 4 mmol/L (ref 3.5–5.1)
Sodium: 133 mmol/L — ABNORMAL LOW (ref 135–145)

## 2021-06-27 LAB — CBC
HCT: 28.8 % — ABNORMAL LOW (ref 36.0–46.0)
Hemoglobin: 9.4 g/dL — ABNORMAL LOW (ref 12.0–15.0)
MCH: 31.9 pg (ref 26.0–34.0)
MCHC: 32.6 g/dL (ref 30.0–36.0)
MCV: 97.6 fL (ref 80.0–100.0)
Platelets: 177 10*3/uL (ref 150–400)
RBC: 2.95 MIL/uL — ABNORMAL LOW (ref 3.87–5.11)
RDW: 15.2 % (ref 11.5–15.5)
WBC: 7.9 10*3/uL (ref 4.0–10.5)
nRBC: 1.7 % — ABNORMAL HIGH (ref 0.0–0.2)

## 2021-06-27 LAB — MAGNESIUM: Magnesium: 2.2 mg/dL (ref 1.7–2.4)

## 2021-06-27 LAB — CULTURE, BLOOD (ROUTINE X 2)
Culture: NO GROWTH
Culture: NO GROWTH
Special Requests: ADEQUATE

## 2021-06-27 NOTE — Progress Notes (Signed)
PROGRESS NOTE  [name] EGB:[id number] DOB: 04/21/1966   PCP: Noreene Larsson, NP  Patient is from: Home.  Lives with husband.  Uses wheelchair at baseline.  DOA: 06/21/2021 LOS: 6  Chief complaints:  Chief Complaint  Patient presents with   Hypotension     Brief Narrative / Interim history: 55 year old F with PMH of ESRD on HD MWF, CHF, PAH, CAD, DM-2, HTN, morbid obesity, Chiari malformation, lymphedema, ambulatory dysfunction and recent hospitalization at [facility] from 7/1-7/3 with RLE cellulitis/lymphedema returned to [facility] ED the same day with lethargy and hypotension, and admitted to [facility] ICU with working diagnosis of septic shock and acute on chronic diastolic CHF with RV failure.  Nephrology and advanced heart failure team consulted.  Started on doxycycline, Flagyl and cefepime.  Eventually improved, and came off pressor on 7/6.  Cultures negative.  Transitioned to daptomycin. Transferred to [facility] hospitalist service on 7/8.   Advanced heart failure team signed off.  Nephrology following.    Subjective: Seen and examined earlier this morning.  No major events overnight of this morning.  She did not go for dialysis yesterday.  She complains severe bilateral lower extremity pain.  No significant improvement with pain medication.  She says, hip pain usually improves after dialysis.  She attributes her pain to fluid.  She describes the pain as throbbing.  She rates her pain 10/10 at although she does not appear to be in that much distress.  She denies chest pain, dyspnea, dizziness or GI symptoms.  Objective: Vitals:   06/26/21 1700 06/26/21 2015 06/27/21 0504 06/27/21 0918  BP:  (!) 103/52 (!) 88/50 96/62  Pulse:  64 66 68  Resp:  _0 Temp:  97.9 F (36.6 C) 98.1 F (36.7 C) 98.2 F (36.8 C)  TempSrc:   Oral Oral  SpO2:  95% 95% 95%  Weight: (!) 146 kg  (!) 149 kg   Height: _1  (1.651 m)       Intake/Output Summary (Last 24 hours) at 06/27/2021 1306 Last data filed at  06/27/2021 0839 Gross per 24 hour  Intake 600 ml  Output 0 ml  Net 600 ml   Filed Weights   06/25/21 0400 06/26/21 1700 06/27/21 0504  Weight: 130.7 kg (!) 146 kg (!) 149 kg    Examination:  GENERAL: No apparent distress.  Nontoxic. HEENT: MMM.  Vision and hearing grossly intact.  NECK: Supple.  No apparent JVD.  RESP: 95% on 4 L.  No IWOB.  Fair aeration bilaterally. CVS:  RRR. Heart sounds normal.  ABD/GI/GU: BS+. Abd soft, NTND.  MSK/EXT: Extensive lymphedema bilaterally. SKIN: no apparent skin lesion or wound NEURO: Awake and alert. Oriented appropriately.  No apparent focal neuro deficit. PSYCH: Calm. Normal affect.   Procedures:  None  Microbiology summarized: [id number] and influenza PCR nonreactive. MRSA PCR screen positive. Blood and urine cultures NGTD  Assessment & Plan: Septic shock likely due to lower extremity cellulitis: POA.  Required vasopressor from 7/3-7/6.  MRSA PCR screen positive.  Cultures negative.  Sepsis physiology resolved except for soft blood pressures although his BP is being checked on your right forearm. -Completed antibiotic course with daptomycin on 7/9. -Not sure if she could tolerate wrapping.  Acute on chronic diastolic CHF with RV failure/PAH: TTE with LVEF of 60 to 65%, moderate LVH, mod-sev RV dysfunction with severe RVE, PASP of 69, mod-sev TVR and dilated IVC -Continue midodrine for BP support -Nephrology managing fluid with dialysis -Advanced heart failure  team signed off.  Acute respiratory failure with hypoxia: Likely due to the above 2.  She is also at risk for OHS/OSA given morbid obesity.  -Wean oxygen as able -Encourage incentive spirometry -She could benefit from sleep study  Elevated troponin: Likely demand ischemia in the setting of sepsis and CHF, and delayed clearance from ESRD.  No RWMA on TTE. -Treat sepsis and CHF as above  Hypotension?  Soft blood pressures but it is from her right forearm.  Not able to check BP  on upper arm due AVF on LUE and midline and PIV's on RUE.  She is not symptomatic.  Not tachycardic either. -Continue midodrine  Elevated liver enzymes: Likely due to septic shock.  CK within normal.  Resolving. Recent Labs  Lab 06/23/21 0153 06/24/21 0254 06/24/21 1427 06/25/21 0409 06/26/21 0609 06/27/21 0140  AST 1,508* 899*  --  343* 133* 69*  ALT 32 14  --  _0 ALKPHOS 374* 388*  --  351* 344* 326*  BILITOT 0.8 1.9*  --  0.9 0.7 0.5  PROT 5.9* 5.7*  --  5.8* 5.8* 5.6*  ALBUMIN 2.9* 2.9* 2.8* 2.9* 2.8* 2.7*  2.7*  -Continue monitoring  ESRD on HD Anemia of renal disease -Per nephrology.  Anemia of renal disease: H&H relatively stable after initial drop. Recent Labs    06/21/21 2130 06/22/21 0454 06/22/21 0934 06/22/21 1414 06/23/21 0153 06/23/21 1912 06/24/21 0254 06/25/21 0409 06/26/21 0609 06/27/21 0140  HGB 11.5* 11.1* 11.2* 11.6* 10.9* 11.6* 10.3* 9.9* 9.5* 9.4*  -Continue monitoring -ESA and IV iron per nephrology   Tachycardia-resolved. -Started on low-dose metoprolol by cardiology  Lymphedema/ambulatory dysfunction/lower extremity pain-wheelchair dependent at baseline. -PT/OT eval to assess for ability to transfer -Continue Tylenol and Norco as needed for pain  Morbid obesity Body mass index is 54.66 kg/m.         DVT prophylaxis:  heparin injection 5,000 Units Start: 06/24/21 1400 SCDs Start: 06/22/21 0149  Code Status: Full code Family Communication: Updated patient's husband over the phone on 7/8. Level of care: Telemetry Cardiac Status is: Inpatient  Remains inpatient appropriate because:Hemodynamically unstable, Unsafe d/c plan, and Inpatient level of care appropriate due to severity of illness  Dispo: The patient is from: Home              Anticipated d/c is to:  To be determined              Patient currently is not medically stable to d/c.   Difficult to place patient No       Consultants:  Critical care Advanced  heart failure team Nephrology   Sch Meds:  Scheduled Meds:  aspirin  81 mg Oral Daily   calcium acetate  667 mg Oral TID WC   Chlorhexidine Gluconate Cloth  6 each Topical Daily   heparin injection (subcutaneous)  5,000 Units Subcutaneous Q8H   mouth rinse  15 mL Mouth Rinse BID   metoprolol succinate  12.5 mg Oral Daily   midodrine  10 mg Oral TID WC   Continuous Infusions:  sodium chloride     sodium chloride     sodium chloride     sodium chloride     DAPTOmycin (CUBICIN)  IV Stopped (06/25/21 0943)   PRN Meds:.sodium chloride, sodium chloride, alteplase, docusate sodium, heparin, lactulose, lidocaine (PF), lidocaine-prilocaine, ondansetron (ZOFRAN) IV, oxyCODONE, pentafluoroprop-tetrafluoroeth, polyethylene glycol  Antimicrobials: Anti-infectives (From admission, onward)    Start     Dose/Rate Route Frequency  Ordered Stop   06/23/21 0930  DAPTOmycin (CUBICIN) 500 mg in sodium chloride 0.9 % IVPB        6 mg/kg  86.2 kg (Adjusted) 120 mL/hr over 30 Minutes Intravenous Every 48 hours 06/23/21 0831 06/27/21 2359   06/23/21 0915  DAPTOmycin (CUBICIN) 800 mg in sodium chloride 0.9 % IVPB  Status:  Discontinued        6 mg/kg  130.1 kg 132 mL/hr over 30 Minutes Intravenous Every 48 hours 06/23/21 0825 06/23/21 0831   06/22/21 2200  ceFEPIme (MAXIPIME) 1 g in sodium chloride 0.9 % 100 mL IVPB  Status:  Discontinued        1 g 200 mL/hr over 30 Minutes Intravenous Every 24 hours 06/22/21 0001 06/23/21 0825   06/22/21 0800  metroNIDAZOLE (FLAGYL) IVPB 500 mg  Status:  Discontinued        500 mg 100 mL/hr over 60 Minutes Intravenous Every 8 hours 06/22/21 0716 06/23/21 0825   06/21/21 2230  doxycycline (VIBRAMYCIN) 100 mg in sodium chloride 0.9 % 250 mL IVPB  Status:  Discontinued        100 mg 125 mL/hr over 120 Minutes Intravenous Every 12 hours 06/21/21 2223 06/23/21 0825   06/21/21 2215  ceFEPIme (MAXIPIME) 2 g in sodium chloride 0.9 % 100 mL IVPB        2 g 200 mL/hr over  30 Minutes Intravenous  Once 06/21/21 2208 06/21/21 2245   06/21/21 2215  metroNIDAZOLE (FLAGYL) IVPB 500 mg        500 mg 100 mL/hr over 60 Minutes Intravenous  Once 06/21/21 2208 06/21/21 2340        I have personally reviewed the following labs and images: CBC: Recent Labs  Lab 06/21/21 2130 06/22/21 0454 06/22/21 0934 06/22/21 1414 06/23/21 1912 06/24/21 0254 06/25/21 0409 06/26/21 0609 06/27/21 0140  WBC 8.8   < > 10.0   < > 8.3 9.0 7.7 7.9 7.9  NEUTROABS 7.1  --  7.5  --   --   --   --   --   --   HGB 11.5*   < > 11.2*   < > 11.6* 10.3* 9.9* 9.5* 9.4*  HCT 37.5   < > 34.9*   < > 34.8* 31.4* 30.8* 29.7* 28.8*  MCV 103.3*   < > 98.6   < > 96.4 96.9 98.1 99.3 97.6  PLT 203   < > 181   < > 151 214 175 172 177   < > = values in this interval not displayed.   BMP &GFR Recent Labs  Lab 06/22/21 1400 06/22/21 1414 06/23/21 0153 06/23/21 0954 06/24/21 1427 06/24/21 1939 06/27/21 0140  NA 133* 131* 131* 134* 132*  --  133*  K 4.8 4.7 5.1 4.6 3.8  --  4.0  CL 97*  --  95* 99 96*  --  99  CO2 23  --  _0 --  25  GLUCOSE 97  --  79 96 173*  --  141*  BUN 46*  --  52* 55* 42*  --  38*  CREATININE 8.39*  --  9.23* 9.05* 7.51*  --  7.81*  CALCIUM 7.3*  --  7.7* 7.4* 7.5*  --  8.1*  MG  --   --   --   --   --  2.0 2.2  PHOS  --   --   --   --  6.0*  --  5.1*  Estimated Creatinine Clearance: 12.2 mL/min (A) (by C-G formula based on SCr of 7.81 mg/dL (H)). Liver & Pancreas: Recent Labs  Lab 06/23/21 0153 06/24/21 0254 06/24/21 1427 06/25/21 0409 06/26/21 0609 06/27/21 0140  AST 1,508* 899*  --  343* 133* 69*  ALT 32 14  --  _0 ALKPHOS 374* 388*  --  351* 344* 326*  BILITOT 0.8 1.9*  --  0.9 0.7 0.5  PROT 5.9* 5.7*  --  5.8* 5.8* 5.6*  ALBUMIN 2.9* 2.9* 2.8* 2.9* 2.8* 2.7*  2.7*   No results for input(s): LIPASE, AMYLASE in the last 168 hours. No results for input(s): AMMONIA in the last 168 hours. Diabetic: No results for input(s): HGBA1C in the  last 72 hours. Recent Labs  Lab 06/26/21 1104 06/26/21 1503 06/26/21 2210 06/27/21 0654 06/27/21 1135  GLUCAP 127* 166* 143* 116* 143*   Cardiac Enzymes: Recent Labs  Lab 06/22/21 1400  CKTOTAL 120   No results for input(s): PROBNP in the last 8760 hours. Coagulation Profile: Recent Labs  Lab 06/23/21 0153  INR 1.9*   Thyroid Function Tests: No results for input(s): TSH, T4TOTAL, FREET4, T3FREE, THYROIDAB in the last 72 hours. Lipid Profile: No results for input(s): CHOL, HDL, LDLCALC, TRIG, CHOLHDL, LDLDIRECT in the last 72 hours. Anemia Panel: No results for input(s): VITAMINB12, FOLATE, FERRITIN, TIBC, IRON, RETICCTPCT in the last 72 hours. Urine analysis:    Component Value Date/Time   COLORURINE YELLOW 08/11/2016 1800   APPEARANCEUR HAZY (A) 08/11/2016 1800   LABSPEC 1.020 08/11/2016 1800   PHURINE 5.5 08/11/2016 1800   GLUCOSEU NEGATIVE 08/11/2016 1800   HGBUR MODERATE (A) 08/11/2016 1800   BILIRUBINUR NEGATIVE 08/11/2016 1800   KETONESUR NEGATIVE 08/11/2016 1800   PROTEINUR 100 (A) 08/11/2016 1800   UROBILINOGEN 0.2 12/02/2014 1925   NITRITE NEGATIVE 08/11/2016 1800   LEUKOCYTESUR NEGATIVE 08/11/2016 1800   Sepsis Labs: Invalid input(s): PROCALCITONIN, [place]  Microbiology: Recent Results (from the past 240 hour(s))  Culture, blood (routine x 2)     Status: None   Collection Time: 06/19/21  2:38 PM   Specimen: BLOOD  Result Value Ref Range Status   Specimen Description BLOOD RIGHT ARM  Final   Special Requests   Final    BOTTLES DRAWN AEROBIC AND ANAEROBIC Blood Culture adequate volume   Culture   Final    NO GROWTH 5 DAYS Performed at [facility], [address], [place], [place] [address]    Report Status 06/24/2021 FINAL  Final  Resp Panel by RT-PCR (Flu A&B, Covid) Nasopharyngeal Swab     Status: None   Collection Time: 06/19/21  6:22 PM   Specimen: Nasopharyngeal Swab; Nasopharyngeal(NP) swabs in vial transport medium  Result Value Ref  Range Status   SARS Coronavirus 2 by RT PCR NEGATIVE NEGATIVE Final    Comment: (NOTE) SARS-CoV-2 target nucleic acids are NOT DETECTED.  The SARS-CoV-2 RNA is generally detectable in upper respiratory specimens during the acute phase of infection. The lowest concentration of SARS-CoV-2 viral copies this assay can detect is 138 copies/mL. A negative result does not preclude SARS-Cov-2 infection and should not be used as the sole basis for treatment or other patient management decisions. A negative result may occur with  improper specimen collection/handling, submission of specimen other than nasopharyngeal swab, presence of viral mutation(s) within the areas targeted by this assay, and inadequate number of viral copies(<138 copies/mL). A negative result must be combined with clinical observations, patient history, and epidemiological information.  The expected result is Negative.  Fact Sheet for Patients:  EntrepreneurPulse.com.au  Fact Sheet for Healthcare Providers:  IncredibleEmployment.be  This test is no t yet approved or cleared by the [place] FDA and  has been authorized for detection and/or diagnosis of SARS-CoV-2 by FDA under an Emergency Use Authorization (EUA). This EUA will remain  in effect (meaning this test can be used) for the duration of the COVID-19 declaration under Section 564(b)(1) of the Act, 21 U.S.C.section 360bbb-3(b)(1), unless the authorization is terminated  or revoked sooner.       Influenza A by PCR NEGATIVE NEGATIVE Final   Influenza B by PCR NEGATIVE NEGATIVE Final    Comment: (NOTE) The Xpert Xpress SARS-CoV-2/FLU/RSV plus assay is intended as an aid in the diagnosis of influenza from Nasopharyngeal swab specimens and should not be used as a sole basis for treatment. Nasal washings and aspirates are unacceptable for Xpert Xpress SARS-CoV-2/FLU/RSV testing.  Fact Sheet for  Patients: EntrepreneurPulse.com.au  Fact Sheet for Healthcare Providers: IncredibleEmployment.be  This test is not yet approved or cleared by the [place] FDA and has been authorized for detection and/or diagnosis of SARS-CoV-2 by FDA under an Emergency Use Authorization (EUA). This EUA will remain in effect (meaning this test can be used) for the duration of the COVID-19 declaration under Section 564(b)(1) of the Act, 21 U.S.C. section 360bbb-3(b)(1), unless the authorization is terminated or revoked.  Performed at [facility], [address], [place], [place] [address]   MRSA Next Gen by PCR, Nasal     Status: Abnormal   Collection Time: 06/19/21  7:24 PM   Specimen: Nasal Mucosa; Nasal Swab  Result Value Ref Range Status   MRSA by PCR Next Gen DETECTED (A) NOT DETECTED Final    Comment: RESULT CALLED TO, READ BACK BY AND VERIFIED WITH: BROWN,B @ 2230 ON 06/19/21 BY JUW (NOTE) The GeneXpert MRSA Assay (FDA approved for NASAL specimens only), is one component of a comprehensive MRSA colonization surveillance program. It is not intended to diagnose MRSA infection nor to guide or monitor treatment for MRSA infections. Test performance is not FDA approved in patients less than 47 years old. Performed at [facility], [address] [place], [place] [address]   Culture, blood (routine x 2)     Status: None   Collection Time: 06/21/21  9:30 PM   Specimen: BLOOD RIGHT FOREARM  Result Value Ref Range Status   Specimen Description BLOOD RIGHT FOREARM  Final   Special Requests   Final    Blood Culture results may not be optimal due to an excessive volume of blood received in culture bottles BOTTLES DRAWN AEROBIC AND ANAEROBIC   Culture   Final    NO GROWTH 5 DAYS Performed at [facility], [address], [place], [place] [address]    Report Status 06/26/2021 FINAL  Final  Culture, blood (routine x 2)     Status: None   Collection Time:  06/21/21  9:53 PM   Specimen: BLOOD RIGHT HAND  Result Value Ref Range Status   Specimen Description BLOOD RIGHT HAND  Final   Special Requests   Final    BOTTLES DRAWN AEROBIC ONLY Blood Culture adequate volume   Culture   Final    NO GROWTH 5 DAYS Performed at [facility], [address] [place], [place] [address]    Report Status 06/26/2021 FINAL  Final  Resp Panel by RT-PCR (Flu A&B, Covid) Nasopharyngeal Swab     Status:  None   Collection Time: 06/21/21 10:40 PM   Specimen: Nasopharyngeal Swab; Nasopharyngeal(NP) swabs in vial transport medium  Result Value Ref Range Status   SARS Coronavirus 2 by RT PCR NEGATIVE NEGATIVE Final    Comment: (NOTE) SARS-CoV-2 target nucleic acids are NOT DETECTED.  The SARS-CoV-2 RNA is generally detectable in upper respiratory specimens during the acute phase of infection. The lowest concentration of SARS-CoV-2 viral copies this assay can detect is 138 copies/mL. A negative result does not preclude SARS-Cov-2 infection and should not be used as the sole basis for treatment or other patient management decisions. A negative result may occur with  improper specimen collection/handling, submission of specimen other than nasopharyngeal swab, presence of viral mutation(s) within the areas targeted by this assay, and inadequate number of viral copies(<138 copies/mL). A negative result must be combined with clinical observations, patient history, and epidemiological information. The expected result is Negative.  Fact Sheet for Patients:  EntrepreneurPulse.com.au  Fact Sheet for Healthcare Providers:  IncredibleEmployment.be  This test is no t yet approved or cleared by the [place] FDA and  has been authorized for detection and/or diagnosis of SARS-CoV-2 by FDA under an Emergency Use Authorization (EUA). This EUA will remain  in effect (meaning this test can be used) for the duration of the COVID-19  declaration under Section 564(b)(1) of the Act, 21 U.S.C.section 360bbb-3(b)(1), unless the authorization is terminated  or revoked sooner.       Influenza A by PCR NEGATIVE NEGATIVE Final   Influenza B by PCR NEGATIVE NEGATIVE Final    Comment: (NOTE) The Xpert Xpress SARS-CoV-2/FLU/RSV plus assay is intended as an aid in the diagnosis of influenza from Nasopharyngeal swab specimens and should not be used as a sole basis for treatment. Nasal washings and aspirates are unacceptable for Xpert Xpress SARS-CoV-2/FLU/RSV testing.  Fact Sheet for Patients: EntrepreneurPulse.com.au  Fact Sheet for Healthcare Providers: IncredibleEmployment.be  This test is not yet approved or cleared by the [place] FDA and has been authorized for detection and/or diagnosis of SARS-CoV-2 by FDA under an Emergency Use Authorization (EUA). This EUA will remain in effect (meaning this test can be used) for the duration of the COVID-19 declaration under Section 564(b)(1) of the Act, 21 U.S.C. section 360bbb-3(b)(1), unless the authorization is terminated or revoked.  Performed at [facility], [address], [place], [place] [address]   MRSA Next Gen by PCR, Nasal     Status: Abnormal   Collection Time: 06/22/21  1:35 AM   Specimen: Nasal Mucosa; Nasal Swab  Result Value Ref Range Status   MRSA by PCR Next Gen DETECTED (A) NOT DETECTED Final    Comment: RESULT CALLED TO, READ BACK BY AND VERIFIED WITH: WARNER,B RN AT 0350 06/22/2021 MITCHELL,L (NOTE) The GeneXpert MRSA Assay (FDA approved for NASAL specimens only), is one component of a comprehensive MRSA colonization surveillance program. It is not intended to diagnose MRSA infection nor to guide or monitor treatment for MRSA infections. Test performance is not FDA approved in patients less than 69 years old. Performed at [facility], [place] [address], [place], [place] [address]   Culture, blood  (routine x 2)     Status: None   Collection Time: 06/22/21  9:34 AM   Specimen: BLOOD RIGHT HAND  Result Value Ref Range Status   Specimen Description BLOOD RIGHT HAND  Final   Special Requests   Final    BOTTLES DRAWN AEROBIC ONLY Blood Culture adequate volume  Culture   Final    NO GROWTH 5 DAYS Performed at [facility], [place] [address], [place], [place] [address]    Report Status 06/27/2021 FINAL  Final  Culture, blood (routine x 2)     Status: None   Collection Time: 06/22/21  9:35 AM   Specimen: BLOOD LEFT HAND  Result Value Ref Range Status   Specimen Description BLOOD LEFT HAND  Final   Special Requests   Final    BOTTLES DRAWN AEROBIC ONLY Blood Culture results may not be optimal due to an inadequate volume of blood received in culture bottles   Culture   Final    NO GROWTH 5 DAYS Performed at [facility], [place] [address] [place], [place] [address]    Report Status 06/27/2021 FINAL  Final    Radiology Studies: No results found.    Kasey Ewings T. [place]  If 7PM-7AM, please contact night-coverage [email or web address] 06/27/2021, 1:06 PM

## 2021-06-27 NOTE — TOC Progression Note (Signed)
Transition of Care [facility]) - Progression Note    Patient Details  Name: [name] MRN: [id number] Date of Birth: 11-Oct-1966  Transition of Care [facility]) CM/SW [place], [place] Phone Number: 06/27/2021, 11:22 AM  Clinical Narrative:   CSW met with patient to discuss recommendation for SNF. Patient in agreement, indicated that she had been to [facility] before, and has concern about where [facility] will transport for rehab. CSW reassured patient that we'd make sure [facility] knew where she went for SNF and could assist with transportation. CSW updated patient that [facility] is having a COVID outbreak right now, so she is interested in looking elsewhere. CSW discussed faxing out referral and following up with bed offers for patient to research, as she is not familiar with other options. Patient has received both COVID vaccines, but no booster. Patient indicated that she is due for the booster and interested in receiving it. CSW sent message to MD to ask if patient can receive while she's here. Patient was tearful at end of discussion because her father's funeral is tomorrow and she just finished making arrangements. CSW provided emotional support, and patient was appreciative of CSW assistance. CSW to follow.    Expected Discharge Plan: Skilled Nursing Facility Barriers to Discharge: Continued Medical Work up, Ship broker  Expected Discharge Plan and Services Expected Discharge Plan: [place] In-house Referral: Clinical Social Work   Post Acute Care Choice: [place] Living arrangements for the past 2 months: Single Family Home                                       Social Determinants of Health (SDOH) Interventions Food Insecurity Interventions: Intervention Not Indicated Financial Strain Interventions: Intervention Not Indicated Housing Interventions: Intervention Not Indicated Transportation Interventions: Intervention  Not Indicated  Readmission Risk Interventions Readmission Risk Prevention Plan 04/30/2019  Transportation Screening Complete  Medication Review Press photographer) Complete  PCP or Specialist appointment within 3-5 days of discharge Complete  HRI or [place] Complete  SW Recovery Care/Counseling Consult Complete  [place] Not Applicable  Some recent data might be hidden

## 2021-06-27 NOTE — Progress Notes (Signed)
[name] Progress Note    Assessment/ Plan:   # ESRD: Outpatient orders: [facility], Monday Wednesday Friday, 255 minutes.  250 BFR/500 DFR of the refill of her medications..  1K, 2.5 calcium, 138 sodium, EDW 135.5 kg, 15-gauge needles, machine temp 37 -hd today. Will plan for another treatment tomorrow (sequential treatment/dry UF) for further UF. Will get back on MWF thereafter   Acute on chronic RV failure with shock Sepsis, septic shock likely secondary to sepsis -abx per ccm/primary, off pressors -possible that her AVF is a contributing factor, will need to have this looked as an outpatient unless she is not making any improvements then would need vascular surgery to evaluate her for possible ligation -HF following -on midodrine, increased to 65m tid   Lactic acidosis, metabolic acidosis -off nahco3 gtt, improved   Volume/ hypertension: EDW 135.5kg. Will UF as tolerated   Anemia of Chronic Kidney Disease: Currently receiving epo 12k units and venofer 595m Holding on IV iron   Secondary Hyperparathyroidism/Hyperphosphatemia: sensipar 60 qtx    Vascular access: lue avf +b/t. Will likely need this ligated as an oupatient   DM2 with hyperglycemia: mgmt per primary service   # Additional recommendations: - Dose all meds for creatinine clearance < 10 ml/min  - Unless absolutely necessary, no MRIs with gadolinium.  - Implement save arm precautions.  Prefer needle sticks in the dorsum of the hands or wrists.  No blood pressure measurements in arm. - If blood transfusion is requested during hemodialysis sessions, please alert us[place]arior to the session. - If a hemodialysis catheter line culture is requested, please alert us[place]oreas only hemodialysis nurses are able to collect those specimens.   Lindsay QuintMD [name]  Subjective:   No acute events, out of ICU. Did not run on HD, not sure why as she was open for HD yesterday. Likely staffing  issues. She continues to have some discomfort from fluid build up. Discussed with HD staff that she needs to run on HD now.    Objective:   BP 96/62 (BP Location: Right Wrist)   Pulse 68   Temp 98.2 F (36.8 C) (Oral)   Resp 18   Ht _0  (1.651 m)   Wt (!) 149 kg   SpO2 95%   BMI 54.66 kg/m   Intake/Output Summary (Last 24 hours) at 06/27/2021 1049 Last data filed at 06/26/2021 2200 Gross per 24 hour  Intake 480 ml  Output 0 ml  Net 480 ml   Weight change:   Physical Exam: Gen:nad, sitting up in bed CVS:s1s2, rrr Resp:cta bl AbWKM:QKMMNsoft, nt ExOTR:RNHAFBXUXYilateral LE's w/ chronic skin changes Neuro: awake, alert, moves all ext spontaneously, speech clear and coherent HD access: lue avf +b/t  Imaging: No results found.  Labs: BMET Recent Labs  Lab 06/21/21 2254 06/22/21 0454 06/22/21 0934 06/22/21 1400 06/22/21 1414 06/23/21 0153 06/23/21 0954 06/24/21 1427 06/27/21 0140  NA 132*  --  133* 133* 131* 131* 134* 132* 133*  K 4.8  --  5.0 4.8 4.7 5.1 4.6 3.8 4.0  CL 93*  --  97* 97*  --  95* 99 96* 99  CO2 17*  --  21* 23  --  _1 GLUCOSE 152*  --  142* 97  --  79 96 173* 141*  BUN 44*  --  44* 46*  --  52* 55* 42* 38*  CREATININE 8.45* 8.60* 8.92* 8.39*  --  9.23* 9.05*  7.51* 7.81*  CALCIUM 7.4*  --  7.6* 7.3*  --  7.7* 7.4* 7.5* 8.1*  PHOS  --   --   --   --   --   --   --  6.0* 5.1*   CBC Recent Labs  Lab 06/21/21 2130 06/22/21 0454 06/22/21 0934 06/22/21 1414 06/24/21 0254 06/25/21 0409 06/26/21 0609 06/27/21 0140  WBC 8.8   < > 10.0   < > 9.0 7.7 7.9 7.9  NEUTROABS 7.1  --  7.5  --   --   --   --   --   HGB 11.5*   < > 11.2*   < > 10.3* 9.9* 9.5* 9.4*  HCT 37.5   < > 34.9*   < > 31.4* 30.8* 29.7* 28.8*  MCV 103.3*   < > 98.6   < > 96.9 98.1 99.3 97.6  PLT 203   < > 181   < > 214 175 172 177   < > = values in this interval not displayed.    Medications:     aspirin  81 mg Oral Daily   calcium acetate  667 mg Oral TID WC    Chlorhexidine Gluconate Cloth  6 each Topical Daily   heparin injection (subcutaneous)  5,000 Units Subcutaneous Q8H   mouth rinse  15 mL Mouth Rinse BID   metoprolol succinate  12.5 mg Oral Daily   midodrine  10 mg Oral TID WC      Lindsay Quint, [name] 06/27/2021, 10:49 AM

## 2021-06-27 NOTE — NC FL2 (Signed)
[place] LEVEL OF CARE SCREENING TOOL     IDENTIFICATION  Patient Name: [name] Birthdate: July 27, 1966 Sex: female Admission Date (Current Location): 06/21/2021  [facility] and [place] Number:  [employer] and Address:  [facility] [place], [place] [address] [place], [place] [address]      Provider Number: [id number]  Attending Physician Name and Address:  Mercy Riding, MD  Relative Name and Phone Number:       Current Level of Care: Hospital Recommended Level of Care: [place] Prior Approval Number:    Date Approved/Denied:   PASRR Number: [id number]  Discharge Plan: SNF    Current Diagnoses: Patient Active Problem List   Diagnosis Date Noted   Cardiogenic shock ([place])    Cellulitis, leg 06/20/2021   COVID-19 02/03/2021   Enteritis, enteropathogenic E. coli 07/02/2020   Bacteremia 07/01/2020   Sepsis due to undetermined organism ([place]) 06/30/2020   Anemia, unspecified    Essential (primary) hypertension    Hyperlipidemia, unspecified    Type 2 diabetes mellitus with hyperglycemia ([place])    Chronic kidney disease, stage 5 ([place])    Chronic obstructive pulmonary disease, unspecified ([place])    Fibromyalgia    Hypertensive chronic kidney disease with stage 1 through stage 4 chronic kidney disease, or unspecified chronic kidney disease    Hypertensive chronic kidney disease with stage 5 chronic kidney disease or end stage renal disease ([place])    Lymphedema, not elsewhere classified    Morbid (severe) obesity due to excess calories ([place])    Peritonitis, unspecified ([place])    Type 2 diabetes mellitus with diabetic nephropathy ([place])    Abdominal pain 04/20/2019   Peritonitis ([place]) 12/21/2018   Abdominal pain, LLQ 12/21/2018   Influenza B 12/21/2018   SOB (shortness of breath) 06/27/2018   Hypokalemia 06/27/2018   Chronic renal failure    Septic shock ([place]) 01/24/2017   Shock liver 01/24/2017   Cor pulmonale,  chronic ([place]) 01/24/2017   Elevated troponin    Flu-like symptoms 01/18/2017   Fever 01/18/2017   HCAP (healthcare-associated pneumonia) 12/31/2016   Numbness and tingling in right hand 08/13/2016   Sepsis ([place]) 08/11/2016   Coagulase negative Staphylococcus bacteremia 06/11/2016   ESRD (end stage renal disease) ([place]) 06/06/2016   Chronic cholecystitis 12/09/2015   Protein-calorie malnutrition, severe 11/29/2015   Respiratory arrest ([place]) 11/28/2015   Cellulitis 11/13/2015   Recurrent cellulitis of lower leg 11/13/2015   Chronic diastolic CHF (congestive heart failure) ([place]) 11/13/2015   Chronic respiratory failure with hypoxia ([place]) 11/13/2015   ESRD on dialysis ([place]) 11/13/2015   Anemia of chronic disease 11/13/2015   Rotator cuff syndrome of right shoulder 05/30/2013   Pain in joint, shoulder region 05/30/2013   Obesity hypoventilation syndrome ([place]) 09/04/2012   Hyperkalemia 11/11/2011   Leukopenia 11/11/2011   HTN (hypertension) 11/11/2011   HLD (hyperlipidemia) 11/11/2011   Transaminitis 11/11/2011   Diabetes mellitus ([place]) 10/27/2011   Morbid obesity ([place]) 10/27/2011   Lymphedema of lower extremity 10/04/2011    Orientation RESPIRATION BLADDER Height & Weight     Self, Time, Situation, Place  O2 ( 4L) Continent Weight: (!) 328 lb 7.8 oz (149 kg) Height:  _0  (165.1 cm)  BEHAVIORAL SYMPTOMS/MOOD NEUROLOGICAL BOWEL NUTRITION STATUS      Continent Diet (regular)  AMBULATORY STATUS COMMUNICATION OF NEEDS Skin   Extensive Assist Verbally Skin abrasions (left upper thigh abrasion, no dressing)  Personal Care Assistance Level of Assistance  Bathing, Feeding, Dressing Bathing Assistance: Limited assistance Feeding assistance: Independent Dressing Assistance: Limited assistance     Functional Limitations Info             SPECIAL CARE FACTORS FREQUENCY  PT (By licensed PT), OT (By licensed OT)     PT Frequency: 5x/wk OT Frequency:  5x/wk            Contractures Contractures Info: Not present    Additional Factors Info  Code Status, Allergies Code Status Info: Full Allergies Info: Contrast Media (Iodinated Diagnostic Agents), Pneumococcal Vaccines, Vancomycin           Current Medications (06/27/2021):  This is the current hospital active medication list Current Facility-Administered Medications  Medication Dose Route Frequency Provider Last Rate Last Admin   0.9 %  sodium chloride infusion  250 mL Intravenous Continuous Hayden Rasmussen, MD       0.9 %  sodium chloride infusion  250 mL Intravenous Continuous Anders Simmonds, MD       0.9 %  sodium chloride infusion  100 mL Intravenous PRN Gean Quint, MD       0.9 %  sodium chloride infusion  100 mL Intravenous PRN Gean Quint, MD       alteplase (CATHFLO ACTIVASE) injection 2 mg  2 mg Intracatheter Once PRN Gean Quint, MD       aspirin chewable tablet 81 mg  81 mg Oral Daily Anders Simmonds, MD   81 mg at 06/26/21 1051   calcium acetate (PHOSLO) capsule 667 mg  667 mg Oral TID WC Collier Bullock, MD   667 mg at 06/27/21 0753   Chlorhexidine Gluconate Cloth 2 % PADS 6 each  6 each Topical Daily Noemi Chapel P, DO   6 each at 06/26/21 1501   DAPTOmycin (CUBICIN) 500 mg in sodium chloride 0.9 % IVPB  6 mg/kg (Adjusted) Intravenous Q48H Candee Furbish, MD   Stopped at 06/25/21 [phone]   docusate sodium (COLACE) capsule 100 mg  100 mg Oral BID PRN Collier Bullock, MD       heparin injection 1,000 Units  1,000 Units Dialysis PRN Gean Quint, MD       heparin injection 5,000 Units  5,000 Units Subcutaneous Q8H Candee Furbish, MD   5,000 Units at 06/27/21 0600   lactulose (CHRONULAC) 10 GM/15ML solution 30 g  30 g Oral Daily PRN Collier Bullock, MD       lidocaine (PF) (XYLOCAINE) 1 % injection 5 mL  5 mL Intradermal PRN Gean Quint, MD       lidocaine-prilocaine (EMLA) cream 1 application  1 application Topical PRN Gean Quint, MD       MEDLINE mouth  rinse  15 mL Mouth Rinse BID Noemi Chapel P, DO   15 mL at 06/26/21 1502   metoprolol succinate (TOPROL-XL) 24 hr tablet 12.5 mg  12.5 mg Oral Daily Larey Dresser, MD   12.5 mg at 06/26/21 1052   midodrine (PROAMATINE) tablet 10 mg  10 mg Oral TID WC Gean Quint, MD   10 mg at 06/27/21 0753   ondansetron (ZOFRAN) injection 4 mg  4 mg Intravenous Q6H PRN Noemi Chapel P, DO   4 mg at 06/22/21 1733   oxyCODONE (Oxy IR/ROXICODONE) immediate release tablet 5 mg  5 mg Oral Q4H PRN Iona Beard, MD   5 mg at 06/27/21 0753   pentafluoroprop-tetrafluoroeth (GEBAUERS) aerosol 1 application  1 application Topical  PRN Gean Quint, MD       polyethylene glycol (MIRALAX / GLYCOLAX) packet 17 g  17 g Oral Daily PRN Collier Bullock, MD         Discharge Medications: Please see discharge summary for a list of discharge medications.  Relevant Imaging Results:  Relevant Lab Results:   Additional Information SS#: [id number]. HD MWF at [facility], 6:00 am. Set up with [facility] for transportation.  Geralynn Ochs, LCSW

## 2021-06-28 DIAGNOSIS — J9601 Acute respiratory failure with hypoxia: Secondary | ICD-10-CM | POA: Diagnosis not present

## 2021-06-28 DIAGNOSIS — I890 Lymphedema, not elsewhere classified: Secondary | ICD-10-CM | POA: Diagnosis not present

## 2021-06-28 DIAGNOSIS — A419 Sepsis, unspecified organism: Secondary | ICD-10-CM | POA: Diagnosis not present

## 2021-06-28 LAB — CBC
HCT: 30.3 % — ABNORMAL LOW (ref 36.0–46.0)
Hemoglobin: 9.6 g/dL — ABNORMAL LOW (ref 12.0–15.0)
MCH: 31.3 pg (ref 26.0–34.0)
MCHC: 31.7 g/dL (ref 30.0–36.0)
MCV: 98.7 fL (ref 80.0–100.0)
Platelets: 180 10*3/uL (ref 150–400)
RBC: 3.07 MIL/uL — ABNORMAL LOW (ref 3.87–5.11)
RDW: 15.4 % (ref 11.5–15.5)
WBC: 8.5 10*3/uL (ref 4.0–10.5)
nRBC: 1.4 % — ABNORMAL HIGH (ref 0.0–0.2)

## 2021-06-28 LAB — HEPATIC FUNCTION PANEL
ALT: 6 U/L (ref 0–44)
AST: 38 U/L (ref 15–41)
Albumin: 2.8 g/dL — ABNORMAL LOW (ref 3.5–5.0)
Alkaline Phosphatase: 328 U/L — ABNORMAL HIGH (ref 38–126)
Bilirubin, Direct: 0.1 mg/dL (ref 0.0–0.2)
Indirect Bilirubin: 0.5 mg/dL (ref 0.3–0.9)
Total Bilirubin: 0.6 mg/dL (ref 0.3–1.2)
Total Protein: 6 g/dL — ABNORMAL LOW (ref 6.5–8.1)

## 2021-06-28 LAB — GLUCOSE, CAPILLARY
Glucose-Capillary: 118 mg/dL — ABNORMAL HIGH (ref 70–99)
Glucose-Capillary: 123 mg/dL — ABNORMAL HIGH (ref 70–99)
Glucose-Capillary: 152 mg/dL — ABNORMAL HIGH (ref 70–99)
Glucose-Capillary: 152 mg/dL — ABNORMAL HIGH (ref 70–99)

## 2021-06-28 NOTE — Plan of Care (Signed)
  Problem: Nutrition: Goal: Adequate nutrition will be maintained Outcome: Adequate for Discharge   Problem: Elimination: Goal: Will not experience complications related to bowel motility Outcome: Adequate for Discharge

## 2021-06-28 NOTE — Progress Notes (Signed)
[facility] Progress Note    Assessment/ Plan:   # ESRD: Outpatient orders: [facility], Monday Wednesday Friday, 255 minutes.  250 BFR/500 DFR of the refill of her medications..  1K, 2.5 calcium, 138 sodium, EDW 135.5 kg, 15-gauge needles, machine temp 37 -sequential treatment today for more UF, back on schedule tomorrow   Acute on chronic RV failure with shock Sepsis, septic shock likely secondary to cellulitis -abx per ccm/primary (completed dapto), off pressors -possible that her AVF is a contributing factor, will need to have this looked as an outpatient unless she is not making any improvements then would need vascular surgery to evaluate her for possible ligation -HF following -on midodrine, increased to 40m tid   Lactic acidosis, metabolic acidosis -off nahco3 gtt, improved   Volume/ hypertension: EDW 135.5kg. Will UF as tolerated   Anemia of Chronic Kidney Disease: Currently receiving epo 12k units and venofer 547m Holding on IV iron   Secondary Hyperparathyroidism/Hyperphosphatemia: sensipar 60 qtx    Vascular access: lue avf +b/t. Will likely need this ligated as an oupatient   DM2 with hyperglycemia: mgmt per primary service   # Additional recommendations: - Dose all meds for creatinine clearance < 10 ml/min  - Unless absolutely necessary, no MRIs with gadolinium.  - Implement save arm precautions.  Prefer needle sticks in the dorsum of the hands or wrists.  No blood pressure measurements in arm. - If blood transfusion is requested during hemodialysis sessions, please alert us[place]orearior to the session. - If a hemodialysis catheter line culture is requested, please alert us[place]oreas only hemodialysis nurses are able to collect those specimens.   ViGean QuintMD [facility]  Subjective:   No acute events. S/p HD last night. Net UF 3.5L. Feels slightly better, still has pain in her legs from edema. Open for HD today. Rough day for her  today, today is her dad's funeral. Net positive 7.7L since admit   Objective:   BP (!) 107/58 (BP Location: Right Arm)   Pulse 72   Temp 98 F (36.7 C) (Oral)   Resp 18   Ht _0  (1.651 m)   Wt (!) 150.9 kg   SpO2 93%   BMI 55.36 kg/m   Intake/Output Summary (Last 24 hours) at 06/28/2021 1025 Last data filed at 06/28/2021 [id number] per 24 hour  Intake 960 ml  Output 3500 ml  Net -2540 ml   Weight change: 4.9 kg  Physical Exam: Gen:nad, sitting up in bed CVS:s1s2, rrr Resp: normal wob AbHKV:QQVZDsoft, nt ExGLO:VFIEPPIRJJilateral LE's w/ chronic skin changes Neuro: awake, alert, moves all ext spontaneously, speech clear and coherent HD access: lue avf +b/t  Imaging: No results found.  Labs: BMET Recent Labs  Lab 06/21/21 2254 06/22/21 0454 06/22/21 0934 06/22/21 1400 06/22/21 1414 06/23/21 0153 06/23/21 0954 06/24/21 1427 06/27/21 0140  NA 132*  --  133* 133* 131* 131* 134* 132* 133*  K 4.8  --  5.0 4.8 4.7 5.1 4.6 3.8 4.0  CL 93*  --  97* 97*  --  95* 99 96* 99  CO2 17*  --  21* 23  --  _1 GLUCOSE 152*  --  142* 97  --  79 96 173* 141*  BUN 44*  --  44* 46*  --  52* 55* 42* 38*  CREATININE 8.45* 8.60* 8.92* 8.39*  --  9.23* 9.05* 7.51* 7.81*  CALCIUM 7.4*  --  7.6* 7.3*  --  7.7* 7.4* 7.5* 8.1*  PHOS  --   --   --   --   --   --   --  6.0* 5.1*   CBC Recent Labs  Lab 06/21/21 2130 06/22/21 0454 06/22/21 0934 06/22/21 1414 06/25/21 0409 06/26/21 0609 06/27/21 0140 06/28/21 0241  WBC 8.8   < > 10.0   < > 7.7 7.9 7.9 8.5  NEUTROABS 7.1  --  7.5  --   --   --   --   --   HGB 11.5*   < > 11.2*   < > 9.9* 9.5* 9.4* 9.6*  HCT 37.5   < > 34.9*   < > 30.8* 29.7* 28.8* 30.3*  MCV 103.3*   < > 98.6   < > 98.1 99.3 97.6 98.7  PLT 203   < > 181   < > 175 172 177 180   < > = values in this interval not displayed.    Medications:     aspirin  81 mg Oral Daily   calcium acetate  667 mg Oral TID WC   Chlorhexidine Gluconate Cloth  6 each  Topical Daily   heparin injection (subcutaneous)  5,000 Units Subcutaneous Q8H   mouth rinse  15 mL Mouth Rinse BID   metoprolol succinate  12.5 mg Oral Daily   midodrine  10 mg Oral TID WC      Gean Quint, MD [facility] 06/28/2021, 10:25 AM

## 2021-06-28 NOTE — Progress Notes (Signed)
PROGRESS NOTE  SULMA RUFFINO DGR:[id number] DOB: [birth year]   PCP: Noreene Larsson, NP  Patient is from: Home.  Lives with husband.  Uses wheelchair at baseline.  DOA: 06/21/2021 LOS: 7  Chief complaints:  Chief Complaint  Patient presents with   Hypotension     Brief Narrative / Interim history: 55 year old F with PMH of ESRD on HD MWF, CHF, PAH, CAD, DM-2, HTN, morbid obesity, Chiari malformation, lymphedema, ambulatory dysfunction and recent hospitalization at [facility] from 7/1-7/3 with RLE cellulitis/lymphedema returned to [facility] ED the same day with lethargy and hypotension, and admitted to [facility] ICU with working diagnosis of septic shock and acute on chronic diastolic CHF with RV failure.  Nephrology and advanced heart failure team consulted.  Started on doxycycline, Flagyl and cefepime.  Eventually improved, and came off pressor on 7/6.  Cultures negative.  Transitioned to daptomycin. Transferred to [facility] hospitalist service on 7/8.   Advanced heart failure team signed off.  Nephrology following.  Therapy recommended SNF.    Subjective: Seen and examined earlier this morning.  No major events overnight of this morning.  Reports bilateral lower extremity pain and back pain.  She attributes BLE pain to the fluid and back pain to being in the the bed.  She had HD with ultrafiltration of 3.5 L yesterday.  Plan is to go back to dialysis today.  She denies chest pain, dyspnea or GI symptoms.  Objective: Vitals:   06/27/21 2355 06/28/21 0025 06/28/21 0542 06/28/21 0900  BP: 117/68 114/67 102/62 (!) 107/58  Pulse: 84 66 69 72  Resp: _0 Temp:  98 F (36.7 C) 97.9 F (36.6 C) 98 F (36.7 C)  TempSrc:  Oral Oral Oral  SpO2: 94% 97% 91% 93%  Weight:      Height:        Intake/Output Summary (Last 24 hours) at 06/28/2021 1513 Last data filed at 06/28/2021 1300 Gross per 24 hour  Intake 960 ml  Output 3500 ml  Net -2540 ml   Filed Weights   06/26/21 1700 06/27/21 0504 06/27/21  1955  Weight: (!) 146 kg (!) 149 kg (!) 150.9 kg    Examination:  GENERAL: No apparent distress.  Nontoxic. HEENT: MMM.  Vision and hearing grossly intact.  NECK: Supple.  No apparent JVD but difficult exam due to body habitus.  RESP: 92% on 2 L no IWOB.  Fair aeration bilaterally. CVS:  RRR. Heart sounds normal.  ABD/GI/GU: BS+. Abd soft, NTND.  MSK/EXT: Extensive lymphedema bilaterally SKIN: no apparent skin lesion or wound NEURO: Awake and alert. Oriented appropriately.  No apparent focal neuro deficit. PSYCH: Calm. Normal affect.   Procedures:  None  Microbiology summarized: [id number] and influenza PCR nonreactive. MRSA PCR screen positive. Blood and urine cultures NGTD  Assessment & Plan: Septic shock likely due to lower extremity cellulitis: POA.  Required vasopressor from 7/3-7/6.  MRSA PCR screen positive.  Cultures negative.  Sepsis physiology resolved except for soft blood pressures although his BP is being checked on your right forearm. -Completed antibiotic course with daptomycin on 7/9.  Acute on chronic diastolic CHF with RV failure/PAH: TTE with LVEF of 60 to 65%, moderate LVH, mod-sev RV dysfunction with severe RVE, PASP of 69, mod-sev TVR and dilated IVC -Continue midodrine for BP support -Nephrology managing fluid with dialysis had 3.5 L FF on 7/9. -Advanced heart failure team signed off.  Acute respiratory failure with hypoxia: Likely due to the above 2.  She is  also at risk for OHS/OSA given morbid obesity.  Improving. -Wean oxygen as able -Encourage incentive spirometry -She could benefit from sleep study  Elevated troponin: Likely demand ischemia in the setting of sepsis and CHF, and delayed clearance from ESRD.  No RWMA on TTE. -Treat sepsis and CHF as above  Hypotension?  not tachycardic.  I am not sure if the BP is accurate since it is checked on right forearm due to left aVF and midline and PIV on right upper arm.  Improved after dialysis. -Continue  midodrine  Elevated liver enzymes: Likely due to septic shock.  CK within normal.  Resolving. Recent Labs  Lab 06/24/21 0254 06/24/21 1427 06/25/21 0409 06/26/21 0609 06/27/21 0140 06/28/21 0241  AST 899*  --  343* 133* 69* 38  ALT 14  --  _0 ALKPHOS 388*  --  351* 344* 326* 328*  BILITOT 1.9*  --  0.9 0.7 0.5 0.6  PROT 5.7*  --  5.8* 5.8* 5.6* 6.0*  ALBUMIN 2.9* 2.8* 2.9* 2.8* 2.7*  2.7* 2.8*  -Continue monitoring  ESRD on HD Anemia of renal disease -Per nephrology.  Anemia of renal disease: H&H relatively stable after initial drop. Recent Labs    06/22/21 0454 06/22/21 0934 06/22/21 1414 06/23/21 0153 06/23/21 1912 06/24/21 0254 06/25/21 0409 06/26/21 0609 06/27/21 0140 06/28/21 0241  HGB 11.1* 11.2* 11.6* 10.9* 11.6* 10.3* 9.9* 9.5* 9.4* 9.6*  -Continue monitoring -ESA and IV iron per nephrology   Tachycardia-resolved. -Started on low-dose metoprolol by cardiology  Lymphedema/ambulatory dysfunction/lower extremity pain-wheelchair dependent at baseline. -PT/OT eval to assess for ability to transfer -Continue Tylenol and Norco as needed for pain  COVID vaccination-interested in booster.  -Will order booster if she is due. Husband to let as know when she had her second shot.   Debility/generalized weakness -PT/OT-recommended SNF.  Morbid obesity Body mass index is 55.36 kg/m.         DVT prophylaxis:  heparin injection 5,000 Units Start: 06/24/21 1400 SCDs Start: 06/22/21 0149  Code Status: Full code Family Communication: Updated patient's husband over the phone on 7/8. Level of care: Telemetry Cardiac Status is: Inpatient  Remains inpatient appropriate because:Unsafe d/c plan  Dispo: The patient is from: Home              Anticipated d/c is to: SNF              Patient currently is medically stable to d/c.   Difficult to place patient No       Consultants:  Critical care Advanced heart failure team Nephrology   Sch Meds:   Scheduled Meds:  aspirin  81 mg Oral Daily   calcium acetate  667 mg Oral TID WC   Chlorhexidine Gluconate Cloth  6 each Topical Daily   heparin injection (subcutaneous)  5,000 Units Subcutaneous Q8H   mouth rinse  15 mL Mouth Rinse BID   metoprolol succinate  12.5 mg Oral Daily   midodrine  10 mg Oral TID WC   Continuous Infusions:  sodium chloride     sodium chloride     PRN Meds:.docusate sodium, lactulose, ondansetron (ZOFRAN) IV, oxyCODONE, polyethylene glycol  Antimicrobials: Anti-infectives (From admission, onward)    Start     Dose/Rate Route Frequency Ordered Stop   06/23/21 0930  DAPTOmycin (CUBICIN) 500 mg in sodium chloride 0.9 % IVPB        6 mg/kg  86.2 kg (Adjusted) 120 mL/hr over 30 Minutes Intravenous Every 48  hours 06/23/21 0831 06/28/21 0130   06/23/21 0915  DAPTOmycin (CUBICIN) 800 mg in sodium chloride 0.9 % IVPB  Status:  Discontinued        6 mg/kg  130.1 kg 132 mL/hr over 30 Minutes Intravenous Every 48 hours 06/23/21 0825 06/23/21 0831   06/22/21 2200  ceFEPIme (MAXIPIME) 1 g in sodium chloride 0.9 % 100 mL IVPB  Status:  Discontinued        1 g 200 mL/hr over 30 Minutes Intravenous Every 24 hours 06/22/21 0001 06/23/21 0825   06/22/21 0800  metroNIDAZOLE (FLAGYL) IVPB 500 mg  Status:  Discontinued        500 mg 100 mL/hr over 60 Minutes Intravenous Every 8 hours 06/22/21 0716 06/23/21 0825   06/21/21 2230  doxycycline (VIBRAMYCIN) 100 mg in sodium chloride 0.9 % 250 mL IVPB  Status:  Discontinued        100 mg 125 mL/hr over 120 Minutes Intravenous Every 12 hours 06/21/21 2223 06/23/21 0825   06/21/21 2215  ceFEPIme (MAXIPIME) 2 g in sodium chloride 0.9 % 100 mL IVPB        2 g 200 mL/hr over 30 Minutes Intravenous  Once 06/21/21 2208 06/21/21 2245   06/21/21 2215  metroNIDAZOLE (FLAGYL) IVPB 500 mg        500 mg 100 mL/hr over 60 Minutes Intravenous  Once 06/21/21 2208 06/21/21 2340        I have personally reviewed the following labs and  images: CBC: Recent Labs  Lab 06/21/21 2130 06/22/21 0454 06/22/21 0934 06/22/21 1414 06/24/21 0254 06/25/21 0409 06/26/21 0609 06/27/21 0140 06/28/21 0241  WBC 8.8   < > 10.0   < > 9.0 7.7 7.9 7.9 8.5  NEUTROABS 7.1  --  7.5  --   --   --   --   --   --   HGB 11.5*   < > 11.2*   < > 10.3* 9.9* 9.5* 9.4* 9.6*  HCT 37.5   < > 34.9*   < > 31.4* 30.8* 29.7* 28.8* 30.3*  MCV 103.3*   < > 98.6   < > 96.9 98.1 99.3 97.6 98.7  PLT 203   < > 181   < > 214 175 172 177 180   < > = values in this interval not displayed.   BMP &GFR Recent Labs  Lab 06/22/21 1400 06/22/21 1414 06/23/21 0153 06/23/21 0954 06/24/21 1427 06/24/21 1939 06/27/21 0140  NA 133* 131* 131* 134* 132*  --  133*  K 4.8 4.7 5.1 4.6 3.8  --  4.0  CL 97*  --  95* 99 96*  --  99  CO2 23  --  _0 --  25  GLUCOSE 97  --  79 96 173*  --  141*  BUN 46*  --  52* 55* 42*  --  38*  CREATININE 8.39*  --  9.23* 9.05* 7.51*  --  7.81*  CALCIUM 7.3*  --  7.7* 7.4* 7.5*  --  8.1*  MG  --   --   --   --   --  2.0 2.2  PHOS  --   --   --   --  6.0*  --  5.1*   Estimated Creatinine Clearance: 12.3 mL/min (A) (by C-G formula based on SCr of 7.81 mg/dL (H)). Liver & Pancreas: Recent Labs  Lab 06/24/21 0254 06/24/21 1427 06/25/21 0409 06/26/21 0609 06/27/21 0140 06/28/21 0241  AST  899*  --  343* 133* 69* 38  ALT 14  --  _0 ALKPHOS 388*  --  351* 344* 326* 328*  BILITOT 1.9*  --  0.9 0.7 0.5 0.6  PROT 5.7*  --  5.8* 5.8* 5.6* 6.0*  ALBUMIN 2.9* 2.8* 2.9* 2.8* 2.7*  2.7* 2.8*   No results for input(s): LIPASE, AMYLASE in the last 168 hours. No results for input(s): AMMONIA in the last 168 hours. Diabetic: No results for input(s): HGBA1C in the last 72 hours. Recent Labs  Lab 06/27/21 1135 06/27/21 1608 06/28/21 0108 06/28/21 0638 06/28/21 1117  GLUCAP 143* 134* 118* 123* 152*   Cardiac Enzymes: Recent Labs  Lab 06/22/21 1400  CKTOTAL 120   No results for input(s): PROBNP in the last 8760  hours. Coagulation Profile: Recent Labs  Lab 06/23/21 0153  INR 1.9*   Thyroid Function Tests: No results for input(s): TSH, T4TOTAL, FREET4, T3FREE, THYROIDAB in the last 72 hours. Lipid Profile: No results for input(s): CHOL, HDL, LDLCALC, TRIG, CHOLHDL, LDLDIRECT in the last 72 hours. Anemia Panel: No results for input(s): VITAMINB12, FOLATE, FERRITIN, TIBC, IRON, RETICCTPCT in the last 72 hours. Urine analysis:    Component Value Date/Time   COLORURINE YELLOW 08/11/2016 1800   APPEARANCEUR HAZY (A) 08/11/2016 1800   LABSPEC 1.020 08/11/2016 1800   PHURINE 5.5 08/11/2016 1800   GLUCOSEU NEGATIVE 08/11/2016 1800   HGBUR MODERATE (A) 08/11/2016 1800   BILIRUBINUR NEGATIVE 08/11/2016 1800   KETONESUR NEGATIVE 08/11/2016 1800   PROTEINUR 100 (A) 08/11/2016 1800   UROBILINOGEN 0.2 12/02/2014 1925   NITRITE NEGATIVE 08/11/2016 1800   LEUKOCYTESUR NEGATIVE 08/11/2016 1800   Sepsis Labs: Invalid input(s): PROCALCITONIN, [place]  Microbiology: Recent Results (from the past 240 hour(s))  Culture, blood (routine x 2)     Status: None   Collection Time: 06/19/21  2:38 PM   Specimen: BLOOD  Result Value Ref Range Status   Specimen Description BLOOD RIGHT ARM  Final   Special Requests   Final    BOTTLES DRAWN AEROBIC AND ANAEROBIC Blood Culture adequate volume   Culture   Final    NO GROWTH 5 DAYS Performed at [facility], [address], [place], [place] [address]    Report Status 06/24/2021 FINAL  Final  Resp Panel by RT-PCR (Flu A&B, Covid) Nasopharyngeal Swab     Status: None   Collection Time: 06/19/21  6:22 PM   Specimen: Nasopharyngeal Swab; Nasopharyngeal(NP) swabs in vial transport medium  Result Value Ref Range Status   SARS Coronavirus 2 by RT PCR NEGATIVE NEGATIVE Final    Comment: (NOTE) SARS-CoV-2 target nucleic acids are NOT DETECTED.  The SARS-CoV-2 RNA is generally detectable in upper respiratory specimens during the acute phase of infection. The  lowest concentration of SARS-CoV-2 viral copies this assay can detect is 138 copies/mL. A negative result does not preclude SARS-Cov-2 infection and should not be used as the sole basis for treatment or other patient management decisions. A negative result may occur with  improper specimen collection/handling, submission of specimen other than nasopharyngeal swab, presence of viral mutation(s) within the areas targeted by this assay, and inadequate number of viral copies(<138 copies/mL). A negative result must be combined with clinical observations, patient history, and epidemiological information. The expected result is Negative.  Fact Sheet for Patients:  EntrepreneurPulse.com.au  Fact Sheet for Healthcare Providers:  IncredibleEmployment.be  This test is no t yet approved or cleared by the [place] and  has been authorized for detection and/or diagnosis of SARS-CoV-2 by FDA under an Emergency Use Authorization (EUA). This EUA will remain  in effect (meaning this test can be used) for the duration of the COVID-19 declaration under Section 564(b)(1) of the Act, 21 U.S.C.section 360bbb-3(b)(1), unless the authorization is terminated  or revoked sooner.       Influenza A by PCR NEGATIVE NEGATIVE Final   Influenza B by PCR NEGATIVE NEGATIVE Final    Comment: (NOTE) The Xpert Xpress SARS-CoV-2/FLU/RSV plus assay is intended as an aid in the diagnosis of influenza from Nasopharyngeal swab specimens and should not be used as a sole basis for treatment. Nasal washings and aspirates are unacceptable for Xpert Xpress SARS-CoV-2/FLU/RSV testing.  Fact Sheet for Patients: EntrepreneurPulse.com.au  Fact Sheet for Healthcare Providers: IncredibleEmployment.be  This test is not yet approved or cleared by the [place] FDA and has been authorized for detection and/or diagnosis of SARS-CoV-2 by FDA under  an Emergency Use Authorization (EUA). This EUA will remain in effect (meaning this test can be used) for the duration of the COVID-19 declaration under Section 564(b)(1) of the Act, 21 U.S.C. section 360bbb-3(b)(1), unless the authorization is terminated or revoked.  Performed at [facility], [address], [place], [place] [address]   MRSA Next Gen by PCR, Nasal     Status: Abnormal   Collection Time: 06/19/21  7:24 PM   Specimen: Nasal Mucosa; Nasal Swab  Result Value Ref Range Status   MRSA by PCR Next Gen DETECTED (A) NOT DETECTED Final    Comment: RESULT CALLED TO, READ BACK BY AND VERIFIED WITH: BROWN,B @ 2230 ON 06/19/21 BY JUW (NOTE) The GeneXpert MRSA Assay (FDA approved for NASAL specimens only), is one component of a comprehensive MRSA colonization surveillance program. It is not intended to diagnose MRSA infection nor to guide or monitor treatment for MRSA infections. Test performance is not FDA approved in patients less than 51 years old. Performed at [facility], [address], [place], [place] [address]   Culture, blood (routine x 2)     Status: None   Collection Time: 06/21/21  9:30 PM   Specimen: BLOOD RIGHT FOREARM  Result Value Ref Range Status   Specimen Description BLOOD RIGHT FOREARM  Final   Special Requests   Final    Blood Culture results may not be optimal due to an excessive volume of blood received in culture bottles BOTTLES DRAWN AEROBIC AND ANAEROBIC   Culture   Final    NO GROWTH 5 DAYS Performed at [facility], [address] [place], [place] [address]    Report Status 06/26/2021 FINAL  Final  Culture, blood (routine x 2)     Status: None   Collection Time: 06/21/21  9:53 PM   Specimen: BLOOD RIGHT HAND  Result Value Ref Range Status   Specimen Description BLOOD RIGHT HAND  Final   Special Requests   Final    BOTTLES DRAWN AEROBIC ONLY Blood Culture adequate volume   Culture   Final    NO GROWTH 5 DAYS Performed at [facility], [address] [place], [place] [address]    Report Status 06/26/2021 FINAL  Final  Resp Panel by RT-PCR (Flu A&B, Covid) Nasopharyngeal Swab     Status: None   Collection Time: 06/21/21 10:40 PM   Specimen: Nasopharyngeal Swab; Nasopharyngeal(NP) swabs in vial transport medium  Result Value Ref Range Status   SARS Coronavirus 2 by RT PCR NEGATIVE NEGATIVE Final  Comment: (NOTE) SARS-CoV-2 target nucleic acids are NOT DETECTED.  The SARS-CoV-2 RNA is generally detectable in upper respiratory specimens during the acute phase of infection. The lowest concentration of SARS-CoV-2 viral copies this assay can detect is 138 copies/mL. A negative result does not preclude SARS-Cov-2 infection and should not be used as the sole basis for treatment or other patient management decisions. A negative result may occur with  improper specimen collection/handling, submission of specimen other than nasopharyngeal swab, presence of viral mutation(s) within the areas targeted by this assay, and inadequate number of viral copies(<138 copies/mL). A negative result must be combined with clinical observations, patient history, and epidemiological information. The expected result is Negative.  Fact Sheet for Patients:  EntrepreneurPulse.com.au  Fact Sheet for Healthcare Providers:  IncredibleEmployment.be  This test is no t yet approved or cleared by the [place] FDA and  has been authorized for detection and/or diagnosis of SARS-CoV-2 by FDA under an Emergency Use Authorization (EUA). This EUA will remain  in effect (meaning this test can be used) for the duration of the COVID-19 declaration under Section 564(b)(1) of the Act, 21 U.S.C.section 360bbb-3(b)(1), unless the authorization is terminated  or revoked sooner.       Influenza A by PCR NEGATIVE NEGATIVE Final   Influenza B by PCR NEGATIVE NEGATIVE Final    Comment: (NOTE) The Xpert Xpress SARS-CoV-2/FLU/RSV  plus assay is intended as an aid in the diagnosis of influenza from Nasopharyngeal swab specimens and should not be used as a sole basis for treatment. Nasal washings and aspirates are unacceptable for Xpert Xpress SARS-CoV-2/FLU/RSV testing.  Fact Sheet for Patients: EntrepreneurPulse.com.au  Fact Sheet for Healthcare Providers: IncredibleEmployment.be  This test is not yet approved or cleared by the [place] FDA and has been authorized for detection and/or diagnosis of SARS-CoV-2 by FDA under an Emergency Use Authorization (EUA). This EUA will remain in effect (meaning this test can be used) for the duration of the COVID-19 declaration under Section 564(b)(1) of the Act, 21 U.S.C. section 360bbb-3(b)(1), unless the authorization is terminated or revoked.  Performed at [facility], [address] [place], [place] [address]   MRSA Next Gen by PCR, Nasal     Status: Abnormal   Collection Time: 06/22/21  1:35 AM   Specimen: Nasal Mucosa; Nasal Swab  Result Value Ref Range Status   MRSA by PCR Next Gen DETECTED (A) NOT DETECTED Final    Comment: RESULT CALLED TO, READ BACK BY AND VERIFIED WITH: WARNER,B RN AT 0350 06/22/2021 MITCHELL,L (NOTE) The GeneXpert MRSA Assay (FDA approved for NASAL specimens only), is one component of a comprehensive MRSA colonization surveillance program. It is not intended to diagnose MRSA infection nor to guide or monitor treatment for MRSA infections. Test performance is not FDA approved in patients less than 29 years old. Performed at [facility], [place] [address], [place], [place] [address]   Culture, blood (routine x 2)     Status: None   Collection Time: 06/22/21  9:34 AM   Specimen: BLOOD RIGHT HAND  Result Value Ref Range Status   Specimen Description BLOOD RIGHT HAND  Final   Special Requests   Final    BOTTLES DRAWN AEROBIC ONLY Blood Culture adequate volume   Culture   Final    NO GROWTH 5  DAYS Performed at [facility], [address] [place], [place] [address]    Report Status 06/27/2021 FINAL  Final  Culture, blood (routine x 2)  Status: None   Collection Time: 06/22/21  9:35 AM   Specimen: BLOOD LEFT HAND  Result Value Ref Range Status   Specimen Description BLOOD LEFT HAND  Final   Special Requests   Final    BOTTLES DRAWN AEROBIC ONLY Blood Culture results may not be optimal due to an inadequate volume of blood received in culture bottles   Culture   Final    NO GROWTH 5 DAYS Performed at [facility], [place] [address] [place], [place] [address]    Report Status 06/27/2021 FINAL  Final    Radiology Studies: No results found.    Melody Cirrincione T. [place]  If 7PM-7AM, please contact night-coverage [email or web address] 06/28/2021, 3:13 PM

## 2021-06-29 ENCOUNTER — Inpatient Hospital Stay (HOSPITAL_COMMUNITY): Payer: 59

## 2021-06-29 DIAGNOSIS — A419 Sepsis, unspecified organism: Secondary | ICD-10-CM | POA: Diagnosis not present

## 2021-06-29 DIAGNOSIS — Z20822 Contact with and (suspected) exposure to covid-19: Secondary | ICD-10-CM | POA: Diagnosis not present

## 2021-06-29 DIAGNOSIS — N186 End stage renal disease: Secondary | ICD-10-CM | POA: Diagnosis not present

## 2021-06-29 DIAGNOSIS — L03115 Cellulitis of right lower limb: Secondary | ICD-10-CM | POA: Diagnosis not present

## 2021-06-29 DIAGNOSIS — J9601 Acute respiratory failure with hypoxia: Secondary | ICD-10-CM | POA: Diagnosis not present

## 2021-06-29 DIAGNOSIS — I132 Hypertensive heart and chronic kidney disease with heart failure and with stage 5 chronic kidney disease, or end stage renal disease: Secondary | ICD-10-CM | POA: Diagnosis not present

## 2021-06-29 DIAGNOSIS — I890 Lymphedema, not elsewhere classified: Secondary | ICD-10-CM | POA: Diagnosis not present

## 2021-06-29 LAB — RENAL FUNCTION PANEL
Albumin: 2.8 g/dL — ABNORMAL LOW (ref 3.5–5.0)
Anion gap: 12 (ref 5–15)
BUN: 30 mg/dL — ABNORMAL HIGH (ref 6–20)
CO2: 25 mmol/L (ref 22–32)
Calcium: 8.4 mg/dL — ABNORMAL LOW (ref 8.9–10.3)
Chloride: 93 mmol/L — ABNORMAL LOW (ref 98–111)
Creatinine, Ser: 6.75 mg/dL — ABNORMAL HIGH (ref 0.44–1.00)
GFR, Estimated: 7 mL/min — ABNORMAL LOW (ref >60)
Glucose, Bld: 102 mg/dL — ABNORMAL HIGH (ref 70–99)
Phosphorus: 5.3 mg/dL — ABNORMAL HIGH (ref 2.5–4.6)
Potassium: 4.3 mmol/L (ref 3.5–5.1)
Sodium: 130 mmol/L — ABNORMAL LOW (ref 135–145)

## 2021-06-29 LAB — GLUCOSE, CAPILLARY
Glucose-Capillary: 118 mg/dL — ABNORMAL HIGH (ref 70–99)
Glucose-Capillary: 83 mg/dL (ref 70–99)
Glucose-Capillary: 156 mg/dL — ABNORMAL HIGH (ref 70–99)
Glucose-Capillary: 151 mg/dL — ABNORMAL HIGH (ref 70–99)

## 2021-06-29 LAB — TROPONIN I (HIGH SENSITIVITY)
Troponin I (High Sensitivity): 152 ng/L (ref <18)
Troponin I (High Sensitivity): 162 ng/L (ref <18)

## 2021-06-29 LAB — CBC
HCT: 30.8 % — ABNORMAL LOW (ref 36.0–46.0)
Hemoglobin: 9.8 g/dL — ABNORMAL LOW (ref 12.0–15.0)
MCH: 31.5 pg (ref 26.0–34.0)
MCHC: 31.8 g/dL (ref 30.0–36.0)
MCV: 99 fL (ref 80.0–100.0)
Platelets: 197 10*3/uL (ref 150–400)
RBC: 3.11 MIL/uL — ABNORMAL LOW (ref 3.87–5.11)
RDW: 15.7 % — ABNORMAL HIGH (ref 11.5–15.5)
WBC: 7 10*3/uL (ref 4.0–10.5)
nRBC: 1.1 % — ABNORMAL HIGH (ref 0.0–0.2)

## 2021-06-29 LAB — BRAIN NATRIURETIC PEPTIDE: B Natriuretic Peptide: 718.9 pg/mL — ABNORMAL HIGH (ref 0.0–100.0)

## 2021-06-29 MED ORDER — ACETAMINOPHEN 325 MG PO TABS: 650.0000 mg | ORAL_TABLET | Freq: Four times a day (QID) | ORAL | Status: DC | PRN

## 2021-06-29 MED ORDER — ACETAMINOPHEN 325 MG PO TABS
650.0000 mg | ORAL_TABLET | Freq: Four times a day (QID) | ORAL | Status: DC | PRN
  Administered 2021-06-29 – 2021-07-26 (×7): 650 mg via ORAL
  Filled 2021-06-29 (×7): qty 2

## 2021-06-29 MED ORDER — MIDODRINE HCL 5 MG PO TABS
ORAL_TABLET | ORAL | Status: AC
  Administered 2021-06-29: 10 mg via ORAL
  Filled 2021-06-29: qty 2

## 2021-06-29 MED ORDER — METOPROLOL TARTRATE 12.5 MG HALF TABLET
12.5000 mg | ORAL_TABLET | Freq: Two times a day (BID) | ORAL | Status: DC
  Filled 2021-06-29: qty 1

## 2021-06-29 MED ORDER — ACETAMINOPHEN 325 MG PO TABS
ORAL_TABLET | ORAL | Status: AC
  Filled 2021-06-29: qty 2

## 2021-06-29 MED ORDER — COVID-19 MRNA VACC (MODERNA) 50 MCG/0.25ML IM SUSP
0.2500 mL | Freq: Once | INTRAMUSCULAR | Status: AC
  Filled 2021-06-29: qty 0.25

## 2021-06-29 NOTE — Progress Notes (Addendum)
Critical Lab valve Troponin 152. MD notify

## 2021-06-29 NOTE — Progress Notes (Signed)
   06/29/21 1453  Assess: MEWS Score  Temp 98.5 F (36.9 C)  BP 99/70  Pulse Rate (!) 146  Resp 18  SpO2 95 %  O2 Device Nasal Cannula  O2 Flow Rate (L/min) 2 L/min  Assess: MEWS Score  MEWS Temp 0  MEWS Systolic 1  MEWS Pulse 3  MEWS RR 0  MEWS LOC 0  MEWS Score 4  MEWS Score Color Red  Assess: if the MEWS score is Yellow or Red  Were vital signs taken at a resting state? Yes  Focused Assessment No change from prior assessment  Early Detection of Sepsis Score *See Row Information* Low  MEWS guidelines implemented *See Row Information* Yes  Notify: Charge Nurse/RN  Name of Charge Nurse/RN Notified Mohamud Mrozek  Date Charge Nurse/RN Notified 06/29/21  Time Charge Nurse/RN Notified 1500  Notify: Provider  Provider Name/Title MD Cyndia Skeeters  Date Provider Notified 06/29/21  Time Provider Notified 1500  Notification Type Page  Notification Reason Change in status  Provider response See new orders  Date of Provider Response 06/29/21  Time of Provider Response 1505  Document  Patient Outcome Other (Comment) (patient resting comfortablily, see orders)

## 2021-06-29 NOTE — Progress Notes (Signed)
[facility] Progress Note    Assessment/ Plan:   # ESRD: Outpatient orders: [facility], Monday Wednesday Friday, 255 minutes.  350 BFR/500 DFR  1K, 2.5 calcium, 138 sodium, EDW 135.5 kg, 15-gauge needles, machine temp 37 -HD today per usual MWF schedule, next would be Wed but in light of vol OL will plan for UF tomorrow with extra treatment; pt appreciative of extra tx    Acute on chronic RV failure with shock Sepsis, septic shock likely secondary to cellulitis -abx per ccm/primary (completed dapto) -possible that her AVF is a contributing factor, will need to have this looked as an outpatient unless she is not making any improvements then would need vascular surgery to evaluate her for possible ligation -HF singed off inpt.  -on midodrine 10 TID    Volume/ hypertension: EDW 135.5kg. Will UF as tolerated, well over.  Will attempt daily standing weights.    Anemia of Chronic Kidney Disease: Currently receiving epo 12k units and venofer 12m. Holding on IV iron   Secondary Hyperparathyroidism/Hyperphosphatemia: sensipar 60 qtx    Vascular access: lue avf +b/t. Will likely need this ligated as an oupatient   DM2 with hyperglycemia: mgmt per primary service   # Additional recommendations: - Dose all meds for creatinine clearance < 10 ml/min  - Unless absolutely necessary, no MRIs with gadolinium.  - Implement save arm precautions.  Prefer needle sticks in the dorsum of the hands or wrists.  No blood pressure measurements in arm. - If blood transfusion is requested during hemodialysis sessions, please alert uKoreaprior to the session. - If a hemodialysis catheter line culture is requested, please alert uKoreaas only hemodialysis nurses are able to collect those specimens.   LJannifer HickMD [facility] Pager [phone]  Subjective:   No acute events. Seen on HD.  Net positive 8.7L since admit   Objective:   BP (!) 115/45 (BP Location: Right Arm)    Pulse 69   Temp 98.1 F (36.7 C) (Oral)   Resp 16   Ht _0  (1.651 m)   Wt (!) 146.6 kg   SpO2 100%   BMI 53.78 kg/m   Intake/Output Summary (Last 24 hours) at 06/29/2021 1004 Last data filed at 06/29/2021 0600 Gross per 24 hour  Intake 840 ml  Output 0 ml  Net 840 ml    Weight change:   Physical Exam: Gen:nad, sitting up in bed CVS:s1s2, rrr Resp: normal wob AULA:GTXMI soft, nt EWOE:HOZYYQMGNObilateral LE's w/ chronic skin changes Neuro: awake, alert, moves all ext spontaneously, speech clear and coherent HD access: lue avf +b/t Qb 350  Imaging: No results found.  Labs: BMET Recent Labs  Lab 06/22/21 1400 06/22/21 1414 06/23/21 0153 06/23/21 0954 06/24/21 1427 06/27/21 0140 06/29/21 0628  NA 133* 131* 131* 134* 132* 133* 130*  K 4.8 4.7 5.1 4.6 3.8 4.0 4.3  CL 97*  --  95* 99 96* 99 93*  CO2 23  --  _1 GLUCOSE 97  --  79 96 173* 141* 102*  BUN 46*  --  52* 55* 42* 38* 30*  CREATININE 8.39*  --  9.23* 9.05* 7.51* 7.81* 6.75*  CALCIUM 7.3*  --  7.7* 7.4* 7.5* 8.1* 8.4*  PHOS  --   --   --   --  6.0* 5.1* 5.3*    CBC Recent Labs  Lab 06/26/21 0609 06/27/21 0140 06/28/21 0241 06/29/21 0628  WBC 7.9 7.9 8.5 7.0  HGB 9.5* 9.4* 9.6* 9.8*  HCT 29.7* 28.8* 30.3* 30.8*  MCV 99.3 97.6 98.7 99.0  PLT 172 177 180 197     Medications:     acetaminophen       aspirin  81 mg Oral Daily   calcium acetate  667 mg Oral TID WC   Chlorhexidine Gluconate Cloth  6 each Topical Daily   heparin injection (subcutaneous)  5,000 Units Subcutaneous Q8H   mouth rinse  15 mL Mouth Rinse BID   metoprolol succinate  12.5 mg Oral Daily   midodrine  10 mg Oral TID WC    Jannifer Hick MD [facility] Pager [phone]

## 2021-06-29 NOTE — Progress Notes (Signed)
PROGRESS NOTE  [name]:[id number] DOB: [birth year]   PCP: Noreene Larsson, NP  Patient is from: Home.  Lives with husband.  Uses wheelchair at baseline.  DOA: 06/21/2021 LOS: 8  Chief complaints:  Chief Complaint  Patient presents with   Hypotension     Brief Narrative / Interim history: 55 year old F with PMH of ESRD on HD MWF, CHF, PAH, CAD, DM-2, HTN, morbid obesity, Chiari malformation, lymphedema, ambulatory dysfunction and recent hospitalization at [facility] from 7/1-7/3 with RLE cellulitis/lymphedema returned to [facility] ED the same day with lethargy and hypotension, and admitted to [facility] ICU with working diagnosis of septic shock and acute on chronic diastolic CHF with RV failure.  Nephrology and advanced heart failure team consulted.  Started on doxycycline, Flagyl and cefepime.  Eventually improved, and came off pressor on 7/6.  Cultures negative.  Transitioned to daptomycin. Transferred to [facility] hospitalist service on 7/8.   Advanced heart failure team signed off.  Nephrology following.  Therapy recommended SNF.    Subjective: Seen and examined earlier this morning while on dialysis.  No major events overnight of this morning.  No complaints other than mild headache.  Leg pain better.  No chest pain, dyspnea or GI symptoms.  Objective: Vitals:   06/29/21 1130 06/29/21 1200 06/29/21 1208 06/29/21 1242  BP: 131/69 121/62 (!) 122/58 110/64  Pulse: (!) 55 61  68  Resp: _0 Temp:   97.9 F (36.6 C) 98 F (36.7 C)  TempSrc:   Oral Oral  SpO2: 97% 100% 100% 100%  Weight:   (!) 142.6 kg   Height:        Intake/Output Summary (Last 24 hours) at 06/29/2021 1347 Last data filed at 06/29/2021 1208 Gross per 24 hour  Intake 600 ml  Output 4000 ml  Net -3400 ml   Filed Weights   06/27/21 1955 06/29/21 0756 06/29/21 1208  Weight: (!) 150.9 kg (!) 146.6 kg (!) 142.6 kg    Examination:  GENERAL: No apparent distress.  Nontoxic. HEENT: MMM.  Vision and hearing grossly  intact.  NECK: Supple.  No apparent JVD but difficult exam due to body habitus.  RESP: 100% on RA.  No IWOB.  Fair aeration bilaterally. CVS:  RRR. Heart sounds normal.  ABD/GI/GU: BS+. Abd soft, NTND.  MSK/EXT: Extensive lymphedema bilaterally. SKIN: no apparent skin lesion or wound NEURO: Awake and alert. Oriented appropriately.  No apparent focal neuro deficit. PSYCH: Calm. Normal affect.   Procedures:  None  Microbiology summarized: NFAOZ-30 and influenza PCR nonreactive. MRSA PCR screen positive. Blood and urine cultures NGTD  Assessment & Plan: Septic shock likely due to lower extremity cellulitis: POA.  Required vasopressor from 7/3-7/6.  MRSA PCR screen positive.  Cultures negative.  Sepsis physiology resolved  -Completed antibiotic course with daptomycin on 7/9.  Acute on chronic diastolic CHF with RV failure/PAH: TTE with LVEF of 60 to 65%, moderate LVH, mod-sev RV dysfunction with severe RVE, PASP of 69, mod-sev TVR and dilated IVC -Continue midodrine for BP support -Nephrology managing fluid with dialysis had 4.0 L UF this morning. -Advanced heart failure team signed off.  Acute respiratory failure with hypoxia: Likely due to the above 2.  Resolved.  100% on RA. -Wean oxygen as able -Encourage incentive spirometry.   Elevated troponin: Likely demand ischemia in the setting of sepsis and CHF, and delayed clearance from ESRD.  No RWMA on TTE. -Treat sepsis and CHF as above  Hypotension/sinus tachycardia: Resolved with midodrine  and dialysis.   -Continue midodrine and metoprolol  Elevated liver enzymes: Likely due to septic shock.  Resolved.  ESRD on HD Anemia of renal disease -Per nephrology. Needs CLIP?  Anemia of renal disease: H&H relatively stable after initial drop. Recent Labs    06/22/21 0934 06/22/21 1414 06/23/21 0153 06/23/21 1912 06/24/21 0254 06/25/21 0409 06/26/21 0609 06/27/21 0140 06/28/21 0241 06/29/21 0628  HGB 11.2* 11.6* 10.9* 11.6*  10.3* 9.9* 9.5* 9.4* 9.6* 9.8*  -Continue monitoring -ESA and IV iron per nephrology   Lymphedema/ambulatory dysfunction/lower extremity pain-wheelchair dependent at baseline. -PT/OT eval to assess for ability to transfer -Continue Tylenol and Norco as needed for pain  COVID vaccination-interested in booster.  Reported that his second dose was in April 2021 -Ordered booster shot.  Debility/generalized weakness -PT/OT-recommended SNF.  Tension headache -Tylenol as needed.  Morbid obesity Body mass index is 52.31 kg/m.         DVT prophylaxis:  heparin injection 5,000 Units Start: 06/24/21 1400 SCDs Start: 06/22/21 0149  Code Status: Full code Family Communication: Updated patient's husband over the phone on 7/10 Level of care: Telemetry Cardiac Status is: Inpatient  Remains inpatient appropriate because:Unsafe d/c plan  Dispo: The patient is from: Home              Anticipated d/c is to: SNF              Patient currently is medically stable to d/c.   Difficult to place patient No       Consultants:  Critical care Advanced heart failure team Nephrology   Sch Meds:  Scheduled Meds:  aspirin  81 mg Oral Daily   calcium acetate  667 mg Oral TID WC   Chlorhexidine Gluconate Cloth  6 each Topical Daily   COVID-19 mRNA vaccine (Moderna)  0.25 mL Intramuscular ONCE-1600   heparin injection (subcutaneous)  5,000 Units Subcutaneous Q8H   mouth rinse  15 mL Mouth Rinse BID   metoprolol succinate  12.5 mg Oral Daily   midodrine  10 mg Oral TID WC   Continuous Infusions:  sodium chloride     sodium chloride     PRN Meds:.acetaminophen, docusate sodium, lactulose, ondansetron (ZOFRAN) IV, oxyCODONE, polyethylene glycol  Antimicrobials: Anti-infectives (From admission, onward)    Start     Dose/Rate Route Frequency Ordered Stop   06/23/21 0930  DAPTOmycin (CUBICIN) 500 mg in sodium chloride 0.9 % IVPB        6 mg/kg  86.2 kg (Adjusted) 120 mL/hr over 30  Minutes Intravenous Every 48 hours 06/23/21 0831 06/28/21 0130   06/23/21 0915  DAPTOmycin (CUBICIN) 800 mg in sodium chloride 0.9 % IVPB  Status:  Discontinued        6 mg/kg  130.1 kg 132 mL/hr over 30 Minutes Intravenous Every 48 hours 06/23/21 0825 06/23/21 0831   06/22/21 2200  ceFEPIme (MAXIPIME) 1 g in sodium chloride 0.9 % 100 mL IVPB  Status:  Discontinued        1 g 200 mL/hr over 30 Minutes Intravenous Every 24 hours 06/22/21 0001 06/23/21 0825   06/22/21 0800  metroNIDAZOLE (FLAGYL) IVPB 500 mg  Status:  Discontinued        500 mg 100 mL/hr over 60 Minutes Intravenous Every 8 hours 06/22/21 0716 06/23/21 0825   06/21/21 2230  doxycycline (VIBRAMYCIN) 100 mg in sodium chloride 0.9 % 250 mL IVPB  Status:  Discontinued        100 mg 125 mL/hr over 120  Minutes Intravenous Every 12 hours 06/21/21 2223 06/23/21 0825   06/21/21 2215  ceFEPIme (MAXIPIME) 2 g in sodium chloride 0.9 % 100 mL IVPB        2 g 200 mL/hr over 30 Minutes Intravenous  Once 06/21/21 2208 06/21/21 2245   06/21/21 2215  metroNIDAZOLE (FLAGYL) IVPB 500 mg        500 mg 100 mL/hr over 60 Minutes Intravenous  Once 06/21/21 2208 06/21/21 2340        I have personally reviewed the following labs and images: CBC: Recent Labs  Lab 06/25/21 0409 06/26/21 0609 06/27/21 0140 06/28/21 0241 06/29/21 0628  WBC 7.7 7.9 7.9 8.5 7.0  HGB 9.9* 9.5* 9.4* 9.6* 9.8*  HCT 30.8* 29.7* 28.8* 30.3* 30.8*  MCV 98.1 99.3 97.6 98.7 99.0  PLT 175 172 177 180 197   BMP &GFR Recent Labs  Lab 06/23/21 0153 06/23/21 0954 06/24/21 1427 06/24/21 1939 06/27/21 0140 06/29/21 0628  NA 131* 134* 132*  --  133* 130*  K 5.1 4.6 3.8  --  4.0 4.3  CL 95* 99 96*  --  99 93*  CO2 _0 --  25 25  GLUCOSE 79 96 173*  --  141* 102*  BUN 52* 55* 42*  --  38* 30*  CREATININE 9.23* 9.05* 7.51*  --  7.81* 6.75*  CALCIUM 7.7* 7.4* 7.5*  --  8.1* 8.4*  MG  --   --   --  2.0 2.2  --   PHOS  --   --  6.0*  --  5.1* 5.3*    Estimated Creatinine Clearance: 13.7 mL/min (A) (by C-G formula based on SCr of 6.75 mg/dL (H)). Liver & Pancreas: Recent Labs  Lab 06/24/21 0254 06/24/21 1427 06/25/21 0409 06/26/21 0609 06/27/21 0140 06/28/21 0241 06/29/21 0628  AST 899*  --  343* 133* 69* 38  --   ALT 14  --  _1 --   ALKPHOS 388*  --  351* 344* 326* 328*  --   BILITOT 1.9*  --  0.9 0.7 0.5 0.6  --   PROT 5.7*  --  5.8* 5.8* 5.6* 6.0*  --   ALBUMIN 2.9*   < > 2.9* 2.8* 2.7*  2.7* 2.8* 2.8*   < > = values in this interval not displayed.   No results for input(s): LIPASE, AMYLASE in the last 168 hours. No results for input(s): AMMONIA in the last 168 hours. Diabetic: No results for input(s): HGBA1C in the last 72 hours. Recent Labs  Lab 06/28/21 0638 06/28/21 1117 06/28/21 1624 06/29/21 0645 06/29/21 1241  GLUCAP 123* 152* 152* 118* 83   Cardiac Enzymes: Recent Labs  Lab 06/22/21 1400  CKTOTAL 120   No results for input(s): PROBNP in the last 8760 hours. Coagulation Profile: Recent Labs  Lab 06/23/21 0153  INR 1.9*   Thyroid Function Tests: No results for input(s): TSH, T4TOTAL, FREET4, T3FREE, THYROIDAB in the last 72 hours. Lipid Profile: No results for input(s): CHOL, HDL, LDLCALC, TRIG, CHOLHDL, LDLDIRECT in the last 72 hours. Anemia Panel: No results for input(s): VITAMINB12, FOLATE, FERRITIN, TIBC, IRON, RETICCTPCT in the last 72 hours. Urine analysis:    Component Value Date/Time   COLORURINE YELLOW 08/11/2016 1800   APPEARANCEUR HAZY (A) 08/11/2016 1800   LABSPEC 1.020 08/11/2016 1800   PHURINE 5.5 08/11/2016 1800   GLUCOSEU NEGATIVE 08/11/2016 1800   HGBUR MODERATE (A) 08/11/2016 1800   BILIRUBINUR NEGATIVE 08/11/2016 1800  KETONESUR NEGATIVE 08/11/2016 1800   PROTEINUR 100 (A) 08/11/2016 1800   UROBILINOGEN 0.2 12/02/2014 1925   NITRITE NEGATIVE 08/11/2016 1800   LEUKOCYTESUR NEGATIVE 08/11/2016 1800   Sepsis Labs: Invalid input(s): PROCALCITONIN,  [place]  Microbiology: Recent Results (from the past 240 hour(s))  Culture, blood (routine x 2)     Status: None   Collection Time: 06/19/21  2:38 PM   Specimen: BLOOD  Result Value Ref Range Status   Specimen Description BLOOD RIGHT ARM  Final   Special Requests   Final    BOTTLES DRAWN AEROBIC AND ANAEROBIC Blood Culture adequate volume   Culture   Final    NO GROWTH 5 DAYS Performed at [facility], [address], [place], [place] [address]    Report Status 06/24/2021 FINAL  Final  Resp Panel by RT-PCR (Flu A&B, Covid) Nasopharyngeal Swab     Status: None   Collection Time: 06/19/21  6:22 PM   Specimen: Nasopharyngeal Swab; Nasopharyngeal(NP) swabs in vial transport medium  Result Value Ref Range Status   SARS Coronavirus 2 by RT PCR NEGATIVE NEGATIVE Final    Comment: (NOTE) SARS-CoV-2 target nucleic acids are NOT DETECTED.  The SARS-CoV-2 RNA is generally detectable in upper respiratory specimens during the acute phase of infection. The lowest concentration of SARS-CoV-2 viral copies this assay can detect is 138 copies/mL. A negative result does not preclude SARS-Cov-2 infection and should not be used as the sole basis for treatment or other patient management decisions. A negative result may occur with  improper specimen collection/handling, submission of specimen other than nasopharyngeal swab, presence of viral mutation(s) within the areas targeted by this assay, and inadequate number of viral copies(<138 copies/mL). A negative result must be combined with clinical observations, patient history, and epidemiological information. The expected result is Negative.  Fact Sheet for Patients:  EntrepreneurPulse.com.au  Fact Sheet for Healthcare Providers:  IncredibleEmployment.be  This test is no t yet approved or cleared by the [place] FDA and  has been authorized for detection and/or diagnosis of SARS-CoV-2 by FDA under an  Emergency Use Authorization (EUA). This EUA will remain  in effect (meaning this test can be used) for the duration of the COVID-19 declaration under Section 564(b)(1) of the Act, 21 U.S.C.section 360bbb-3(b)(1), unless the authorization is terminated  or revoked sooner.       Influenza A by PCR NEGATIVE NEGATIVE Final   Influenza B by PCR NEGATIVE NEGATIVE Final    Comment: (NOTE) The Xpert Xpress SARS-CoV-2/FLU/RSV plus assay is intended as an aid in the diagnosis of influenza from Nasopharyngeal swab specimens and should not be used as a sole basis for treatment. Nasal washings and aspirates are unacceptable for Xpert Xpress SARS-CoV-2/FLU/RSV testing.  Fact Sheet for Patients: EntrepreneurPulse.com.au  Fact Sheet for Healthcare Providers: IncredibleEmployment.be  This test is not yet approved or cleared by the [place] FDA and has been authorized for detection and/or diagnosis of SARS-CoV-2 by FDA under an Emergency Use Authorization (EUA). This EUA will remain in effect (meaning this test can be used) for the duration of the COVID-19 declaration under Section 564(b)(1) of the Act, 21 U.S.C. section 360bbb-3(b)(1), unless the authorization is terminated or revoked.  Performed at [facility], [address], [place], [place] [address]   MRSA Next Gen by PCR, Nasal     Status: Abnormal   Collection Time: 06/19/21  7:24 PM   Specimen: Nasal Mucosa; Nasal Swab  Result Value Ref Range Status   MRSA by  PCR Next Gen DETECTED (A) NOT DETECTED Final    Comment: RESULT CALLED TO, READ BACK BY AND VERIFIED WITH: BROWN,B @ 2230 ON 06/19/21 BY JUW (NOTE) The GeneXpert MRSA Assay (FDA approved for NASAL specimens only), is one component of a comprehensive MRSA colonization surveillance program. It is not intended to diagnose MRSA infection nor to guide or monitor treatment for MRSA infections. Test performance is not FDA approved in patients  less than 18 years old. Performed at [facility], [address], [place], [place] [address]   Culture, blood (routine x 2)     Status: None   Collection Time: 06/21/21  9:30 PM   Specimen: BLOOD RIGHT FOREARM  Result Value Ref Range Status   Specimen Description BLOOD RIGHT FOREARM  Final   Special Requests   Final    Blood Culture results may not be optimal due to an excessive volume of blood received in culture bottles BOTTLES DRAWN AEROBIC AND ANAEROBIC   Culture   Final    NO GROWTH 5 DAYS Performed at [facility], [address] [place], [place] [address]    Report Status 06/26/2021 FINAL  Final  Culture, blood (routine x 2)     Status: None   Collection Time: 06/21/21  9:53 PM   Specimen: BLOOD RIGHT HAND  Result Value Ref Range Status   Specimen Description BLOOD RIGHT HAND  Final   Special Requests   Final    BOTTLES DRAWN AEROBIC ONLY Blood Culture adequate volume   Culture   Final    NO GROWTH 5 DAYS Performed at [facility], [address], [place], [place] [address]    Report Status 06/26/2021 FINAL  Final  Resp Panel by RT-PCR (Flu A&B, Covid) Nasopharyngeal Swab     Status: None   Collection Time: 06/21/21 10:40 PM   Specimen: Nasopharyngeal Swab; Nasopharyngeal(NP) swabs in vial transport medium  Result Value Ref Range Status   SARS Coronavirus 2 by RT PCR NEGATIVE NEGATIVE Final    Comment: (NOTE) SARS-CoV-2 target nucleic acids are NOT DETECTED.  The SARS-CoV-2 RNA is generally detectable in upper respiratory specimens during the acute phase of infection. The lowest concentration of SARS-CoV-2 viral copies this assay can detect is 138 copies/mL. A negative result does not preclude SARS-Cov-2 infection and should not be used as the sole basis for treatment or other patient management decisions. A negative result may occur with  improper specimen collection/handling, submission of specimen other than nasopharyngeal swab, presence of viral mutation(s) within  the areas targeted by this assay, and inadequate number of viral copies(<138 copies/mL). A negative result must be combined with clinical observations, patient history, and epidemiological information. The expected result is Negative.  Fact Sheet for Patients:  EntrepreneurPulse.com.au  Fact Sheet for Healthcare Providers:  IncredibleEmployment.be  This test is no t yet approved or cleared by the [place] FDA and  has been authorized for detection and/or diagnosis of SARS-CoV-2 by FDA under an Emergency Use Authorization (EUA). This EUA will remain  in effect (meaning this test can be used) for the duration of the COVID-19 declaration under Section 564(b)(1) of the Act, 21 U.S.C.section 360bbb-3(b)(1), unless the authorization is terminated  or revoked sooner.       Influenza A by PCR NEGATIVE NEGATIVE Final   Influenza B by PCR NEGATIVE NEGATIVE Final    Comment: (NOTE) The Xpert Xpress SARS-CoV-2/FLU/RSV plus assay is intended as an aid in the diagnosis of influenza from Nasopharyngeal swab specimens and should  not be used as a sole basis for treatment. Nasal washings and aspirates are unacceptable for Xpert Xpress SARS-CoV-2/FLU/RSV testing.  Fact Sheet for Patients: EntrepreneurPulse.com.au  Fact Sheet for Healthcare Providers: IncredibleEmployment.be  This test is not yet approved or cleared by the [place] FDA and has been authorized for detection and/or diagnosis of SARS-CoV-2 by FDA under an Emergency Use Authorization (EUA). This EUA will remain in effect (meaning this test can be used) for the duration of the COVID-19 declaration under Section 564(b)(1) of the Act, 21 U.S.C. section 360bbb-3(b)(1), unless the authorization is terminated or revoked.  Performed at [facility], [address] [place], [place] [address]   MRSA Next Gen by PCR, Nasal     Status: Abnormal   Collection  Time: 06/22/21  1:35 AM   Specimen: Nasal Mucosa; Nasal Swab  Result Value Ref Range Status   MRSA by PCR Next Gen DETECTED (A) NOT DETECTED Final    Comment: RESULT CALLED TO, READ BACK BY AND VERIFIED WITH: WARNER,B RN AT 0350 06/22/2021 MITCHELL,L (NOTE) The GeneXpert MRSA Assay (FDA approved for NASAL specimens only), is one component of a comprehensive MRSA colonization surveillance program. It is not intended to diagnose MRSA infection nor to guide or monitor treatment for MRSA infections. Test performance is not FDA approved in patients less than 81 years old. Performed at [facility], [place] [address] [place], [place] [address]   Culture, blood (routine x 2)     Status: None   Collection Time: 06/22/21  9:34 AM   Specimen: BLOOD RIGHT HAND  Result Value Ref Range Status   Specimen Description BLOOD RIGHT HAND  Final   Special Requests   Final    BOTTLES DRAWN AEROBIC ONLY Blood Culture adequate volume   Culture   Final    NO GROWTH 5 DAYS Performed at [facility], [address], [place], [place] [address]    Report Status 06/27/2021 FINAL  Final  Culture, blood (routine x 2)     Status: None   Collection Time: 06/22/21  9:35 AM   Specimen: BLOOD LEFT HAND  Result Value Ref Range Status   Specimen Description BLOOD LEFT HAND  Final   Special Requests   Final    BOTTLES DRAWN AEROBIC ONLY Blood Culture results may not be optimal due to an inadequate volume of blood received in culture bottles   Culture   Final    NO GROWTH 5 DAYS Performed at [facility], [place] [address], [place],  [address]    Report Status 06/27/2021 FINAL  Final    Radiology Studies: No results found.    Tarrance Januszewski T. [place]  If 7PM-7AM, please contact night-coverage [email or web address] 06/29/2021, 1:47 PM

## 2021-06-29 NOTE — Progress Notes (Signed)
Hemodialysis- Tolerated treatment well without issue. Able to remove 4L. BP remained stable throughout. Given tylenol for headache earlier. Patient currently without complaints.

## 2021-06-29 NOTE — Progress Notes (Signed)
PT Cancellation Note  Patient Details Name: [name] MRN: [id number] DOB: 10/02/1966   Cancelled Treatment:    Reason Eval/Treat Not Completed: (P) Patient at procedure or test/unavailable Pt is off floor for HD. PT will follow back for rehab this afternoon.  Caron Tardif B. Migdalia Dk PT, DPT Acute Rehabilitation Services Pager [phone] Office [phone]    [place] 06/29/2021, 9:31 AM

## 2021-06-29 NOTE — Progress Notes (Signed)
PT Cancellation Note  Patient Details Name: LASONJA LAKINS MRN: [id number] DOB: 22-Feb-1966   Cancelled Treatment:    Reason Eval/Treat Not Completed: (P) Medical issues which prohibited therapy Pt is experiencing chest pains after dialysis. PT will follow back for treatment tomorrow.   Dorthea Maina B. Migdalia Dk PT, DPT Acute Rehabilitation Services Pager [phone] Office [phone]    [place] 06/29/2021, 3:54 PM

## 2021-06-30 ENCOUNTER — Inpatient Hospital Stay (HOSPITAL_COMMUNITY): Payer: 59

## 2021-06-30 DIAGNOSIS — I959 Hypotension, unspecified: Secondary | ICD-10-CM | POA: Diagnosis present

## 2021-06-30 DIAGNOSIS — N186 End stage renal disease: Secondary | ICD-10-CM | POA: Diagnosis not present

## 2021-06-30 DIAGNOSIS — I132 Hypertensive heart and chronic kidney disease with heart failure and with stage 5 chronic kidney disease, or end stage renal disease: Secondary | ICD-10-CM | POA: Diagnosis not present

## 2021-06-30 DIAGNOSIS — Z20822 Contact with and (suspected) exposure to covid-19: Secondary | ICD-10-CM | POA: Diagnosis not present

## 2021-06-30 DIAGNOSIS — L03115 Cellulitis of right lower limb: Secondary | ICD-10-CM | POA: Diagnosis not present

## 2021-06-30 DIAGNOSIS — M79662 Pain in left lower leg: Secondary | ICD-10-CM | POA: Diagnosis present

## 2021-06-30 DIAGNOSIS — M79661 Pain in right lower leg: Secondary | ICD-10-CM | POA: Diagnosis present

## 2021-06-30 LAB — TROPONIN I (HIGH SENSITIVITY)
Troponin I (High Sensitivity): 149 ng/L (ref <18)
Troponin I (High Sensitivity): 168 ng/L (ref <18)

## 2021-06-30 LAB — BASIC METABOLIC PANEL
Anion gap: 12 (ref 5–15)
Anion gap: 12 (ref 5–15)
BUN: 17 mg/dL (ref 6–20)
BUN: 18 mg/dL (ref 6–20)
CO2: 25 mmol/L (ref 22–32)
CO2: 26 mmol/L (ref 22–32)
Calcium: 8.5 mg/dL — ABNORMAL LOW (ref 8.9–10.3)
Calcium: 8.5 mg/dL — ABNORMAL LOW (ref 8.9–10.3)
Chloride: 97 mmol/L — ABNORMAL LOW (ref 98–111)
Chloride: 96 mmol/L — ABNORMAL LOW (ref 98–111)
Creatinine, Ser: 5.18 mg/dL — ABNORMAL HIGH (ref 0.44–1.00)
Creatinine, Ser: 5.36 mg/dL — ABNORMAL HIGH (ref 0.44–1.00)
GFR, Estimated: 9 mL/min — ABNORMAL LOW (ref >60)
GFR, Estimated: 9 mL/min — ABNORMAL LOW (ref >60)
Glucose, Bld: 107 mg/dL — ABNORMAL HIGH (ref 70–99)
Glucose, Bld: 95 mg/dL (ref 70–99)
Potassium: 3.9 mmol/L (ref 3.5–5.1)
Potassium: 3.9 mmol/L (ref 3.5–5.1)
Sodium: 134 mmol/L — ABNORMAL LOW (ref 135–145)
Sodium: 134 mmol/L — ABNORMAL LOW (ref 135–145)

## 2021-06-30 LAB — CBC
HCT: 30.9 % — ABNORMAL LOW (ref 36.0–46.0)
Hemoglobin: 9.5 g/dL — ABNORMAL LOW (ref 12.0–15.0)
MCH: 30.7 pg (ref 26.0–34.0)
MCHC: 30.7 g/dL (ref 30.0–36.0)
MCV: 100 fL (ref 80.0–100.0)
Platelets: 201 10*3/uL (ref 150–400)
RBC: 3.09 MIL/uL — ABNORMAL LOW (ref 3.87–5.11)
RDW: 15.7 % — ABNORMAL HIGH (ref 11.5–15.5)
WBC: 6.3 10*3/uL (ref 4.0–10.5)
nRBC: 0 % (ref 0.0–0.2)

## 2021-06-30 LAB — GLUCOSE, CAPILLARY
Glucose-Capillary: 115 mg/dL — ABNORMAL HIGH (ref 70–99)
Glucose-Capillary: 93 mg/dL (ref 70–99)
Glucose-Capillary: 133 mg/dL — ABNORMAL HIGH (ref 70–99)
Glucose-Capillary: 191 mg/dL — ABNORMAL HIGH (ref 70–99)

## 2021-06-30 LAB — TSH: TSH: 4.633 u[IU]/mL — ABNORMAL HIGH (ref 0.350–4.500)

## 2021-06-30 LAB — HEPARIN LEVEL (UNFRACTIONATED)
Heparin Unfractionated: 0.49 IU/mL (ref 0.30–0.70)
Heparin Unfractionated: 0.46 IU/mL (ref 0.30–0.70)

## 2021-06-30 LAB — LACTIC ACID, PLASMA
Lactic Acid, Venous: 1.7 mmol/L (ref 0.5–1.9)
Lactic Acid, Venous: 1.7 mmol/L (ref 0.5–1.9)

## 2021-06-30 LAB — CORTISOL: Cortisol, Plasma: 12.8 ug/dL

## 2021-06-30 LAB — MAGNESIUM: Magnesium: 2 mg/dL (ref 1.7–2.4)

## 2021-06-30 MED ORDER — PHENYLEPHRINE HCL-NACL 10-0.9 MG/250ML-% IV SOLN
0.0000 ug/min | INTRAVENOUS | Status: DC
  Administered 2021-06-30 (×2): 25 ug/min via INTRAVENOUS
  Administered 2021-07-03 (×2): 40 ug/min via INTRAVENOUS
  Administered 2021-07-03: 25 ug/min via INTRAVENOUS
  Administered 2021-07-03: 40 ug/min via INTRAVENOUS
  Administered 2021-07-03: 50 ug/min via INTRAVENOUS
  Administered 2021-07-04: 15 ug/min via INTRAVENOUS
  Administered 2021-07-04: 35 ug/min via INTRAVENOUS
  Administered 2021-07-04: 60 ug/min via INTRAVENOUS
  Administered 2021-07-04: 40 ug/min via INTRAVENOUS
  Administered 2021-07-04 (×2): 60 ug/min via INTRAVENOUS
  Administered 2021-07-05: 25 ug/min via INTRAVENOUS
  Administered 2021-07-07: 35 ug/min via INTRAVENOUS
  Administered 2021-07-07 – 2021-07-08 (×4): 25 ug/min via INTRAVENOUS
  Filled 2021-06-30 (×22): qty 250

## 2021-06-30 MED ORDER — SODIUM CHLORIDE 0.9 % IV BOLUS
250.0000 mL | Freq: Once | INTRAVENOUS | Status: AC
  Administered 2021-06-30: 250 mL via INTRAVENOUS

## 2021-06-30 MED ORDER — HEPARIN BOLUS VIA INFUSION
7000.0000 [IU] | Freq: Once | INTRAVENOUS | Status: AC
  Administered 2021-06-30: 7000 [IU] via INTRAVENOUS
  Filled 2021-06-30: qty 7000

## 2021-06-30 MED ORDER — SODIUM CHLORIDE 0.9 % IV BOLUS
500.0000 mL | Freq: Once | INTRAVENOUS | Status: AC
  Administered 2021-06-30: 500 mL via INTRAVENOUS

## 2021-06-30 MED ORDER — DIPHENHYDRAMINE HCL 50 MG/ML IJ SOLN: 50.0000 mg | Freq: Once | INTRAMUSCULAR | Status: AC

## 2021-06-30 MED ORDER — DIPHENHYDRAMINE HCL 50 MG/ML IJ SOLN: 50.0000 mg | Freq: Once | INTRAMUSCULAR | Status: DC

## 2021-06-30 MED ORDER — MIDODRINE HCL 5 MG PO TABS
10.0000 mg | ORAL_TABLET | Freq: Once | ORAL | Status: AC
  Administered 2021-06-30: 10 mg via ORAL

## 2021-06-30 MED ORDER — DIPHENHYDRAMINE HCL 25 MG PO CAPS: 50.0000 mg | ORAL_CAPSULE | Freq: Once | ORAL | Status: DC

## 2021-06-30 MED ORDER — PHENYLEPHRINE HCL-NACL 20-0.9 MG/250ML-% IV SOLN: 0.0000 ug/min | INTRAVENOUS | Status: DC

## 2021-06-30 MED ORDER — PREDNISONE 20 MG PO TABS
50.0000 mg | ORAL_TABLET | Freq: Four times a day (QID) | ORAL | Status: AC
  Administered 2021-06-30 (×3): 50 mg via ORAL
  Filled 2021-06-30 (×3): qty 2

## 2021-06-30 MED ORDER — SODIUM CHLORIDE 0.9 % IV SOLN: 250.0000 mL | INTRAVENOUS | Status: DC

## 2021-06-30 MED ORDER — HEPARIN (PORCINE) 25000 UT/250ML-% IV SOLN
1650.0000 [IU]/h | INTRAVENOUS | Status: DC
  Administered 2021-06-30 (×2): 1650 [IU]/h via INTRAVENOUS
  Filled 2021-06-30 (×2): qty 250

## 2021-06-30 MED ORDER — HYDROCORTISONE NA SUCCINATE PF 250 MG IJ SOLR
200.0000 mg | Freq: Once | INTRAMUSCULAR | Status: DC
  Filled 2021-06-30: qty 200

## 2021-06-30 MED ORDER — DIPHENHYDRAMINE HCL 25 MG PO CAPS
50.0000 mg | ORAL_CAPSULE | Freq: Once | ORAL | Status: AC
  Administered 2021-06-30: 50 mg via ORAL
  Filled 2021-06-30: qty 2

## 2021-06-30 MED ORDER — DIPHENHYDRAMINE HCL 50 MG/ML IJ SOLN
25.0000 mg | Freq: Four times a day (QID) | INTRAMUSCULAR | Status: DC | PRN
  Administered 2021-06-30: 25 mg via INTRAVENOUS
  Filled 2021-06-30: qty 1

## 2021-06-30 NOTE — Progress Notes (Addendum)
NAME:  [name], MRN:  [id number], DOB:  [birth year], LOS: 9 ADMISSION DATE:  06/21/2021, CONSULTATION DATE:  06/21/21  REFERRING MD:  Melina Copa, ED, CHIEF COMPLAINT:  lethargy    History of Present Illness:  Ms [name] is a 55 year old woman with hx of obesity, lymphedema, ESRD on HD MWF, CHF, recently hospitalized at [facility] for cellulitis, discharged 7/3, returning back same day with lethargy, hypotension and elevated lactic acid.  She tells me her family was worried about her after d/c because she was sleeping a lot, called to have her brought back to hospital.  No change in pain /tenderness to RLE.   In ED: LR bolus 1.8, doxycicline, cefepime, flagyl given.  She transferred out to the hospitalist service on 7/5.  PCCM called back to see patient for hypotension and tachycardia on 7/12.  Pertinent  Medical History  Anemia Thyroid cancer CHF Chiari malformation ESRD on HD/MWF COPD?  CAD HLD  HTN DM lymphedema  Significant Hospital Events: Including procedures, antibiotic start and stop dates in addition to other pertinent events   7/3 admitted to ICU on vasopressors, tachyarrhythmias overnight. Switched from NE> neosynephrine. On heparin. 7/4 Echo with acute on subacute R heart failure, LVH with no RWMA. Trop 3500.  7/5 transferred from ICU to hospitalist service 7/12 CTSP re: hypotension (normal lactate) and tachycardia  Interim History / Subjective:  Reports back pain, which she attributes to progressively worsening swelling.  She has bilateral calf pain.  Had HD yesterday.  Has developed tachycardia and hypotension (SBP fluctuating 70-90) in the interim; surprisingly not toxic in appearance. No improvement in BP with administration of 1 L IV fluids overnight.  She does report mild dizziness when she feels palpitations but not experiencing symptoms at this time.  Has undergone LE U/S and VQ scan during this hospitalization for clinical suspicion of DVT/VTE, but studies have been  suboptimal due to body habitus.  She has previously had allergic reactions (tongue swelling) to IV dye but reports that she was able to tolerate cardiac catheterization with pretreatment for dye allergy.  Objective   Blood pressure (!) 75/47, pulse (!) 130, temperature 98.5 F (36.9 C), resp. rate 16, height _0  (1.651 m), weight (!) 142.6 kg, SpO2 93 %.        Intake/Output Summary (Last 24 hours) at 06/30/2021 0503 Last data filed at 06/30/2021 0447 Gross per 24 hour  Intake 1116.73 ml  Output 4000 ml  Net -2883.27 ml    Filed Weights   06/27/21 1955 06/29/21 0756 06/29/21 1208  Weight: (!) 150.9 kg (!) 146.6 kg (!) 142.6 kg    Examination: Constitutional: no acute distress  Eyes: EOMI, pupils equal Ears, nose, mouth, and throat: MMM, trachea midline Cardiovascular: distant due to body habitus, sounds regular Respiratory: diminished bases, ext warm Gastrointestinal: soft, hypoactive bS Ext: lymphedema,  cannot really tell what is new and what is chronic Skin: No rashes, normal turgor Neurologic: weak but moves all 4 ext Psychiatric: RASS 0  Resolved Hospital Problem list     Assessment & Plan:  Principal Problem:   Hypotension Active Problems:   Elevated troponin   Bilateral calf pain   ESRD on dialysis ([place])   Cardiogenic shock (HCC)   Lymphedema of lower extremity   Morbid obesity (HCC)   Obesity hypoventilation syndrome (HCC)   Cellulitis   Type 2 diabetes mellitus with hyperglycemia (HCC)  Hypotension with RV strain pattern on EKG, persistent mild elevation of troponin, and RV strain  pattern on EKG [place] and VQ scans have been inconclusive.  Her hemodynamic downturn appears to coincide with HD treatment and discontinuation of heparin.  BP has not improved with IV fluids.  She did get a dose of midodrine shortly before we were contacted to see the patient, which has resulted in SBP 90. Pretreat for IV dye allergy with Benadryl, famotidine and SoluMedrol.  Obtain  chest CTA to rule out pulmonary embolism. Repeat troponin. I agree with presumptively initiating systemic heparinization in light of the patient's profound hypotension. Transfer to ICU for blood pressure monitoring and higher level of care. These recommendations were communicated to Dr. Hal Hope by phone.  Acute on chronic RV failure with shock- triggered by RLE cellulitis.  PVR on prior cath was normal.  I wonder if her fistula is contributing.  Ablating this as an outpatient may improve her functional status.  ESRD on HD  RLE cellulitis-  Currently off antibiotic therapy (was previously on daptomycin)  OSA not on CPAP  Congestive hepatopathy- improving   Best Practice (right click and "Reselect all SmartList Selections" daily)   Diet/type: Regular consistency (see orders) DVT prophylaxis: systemic heparin GI prophylaxis: PPI Lines: N/A Foley:  N/A   Code Status: Full Code  Last date of multidisciplinary goals of care discussion [  ]  Critical care time: 30 minutes.  The treatment and management of the patient's condition was required based on the threat of imminent deterioration. This time reflects time spent by the physician evaluating, providing care and managing the critically ill patient's care. The time was spent at the immediate bedside (or on the same floor/unit and dedicated to this patient's care). Time involved in separately billable procedures is NOT included int he critical care time indicated above. Family meeting and update time may be included above if and only if the patient is unable/incompetent to participate in clinical interview and/or decision making, and the discussion was necessary to determining treatment decisions.  Renee Pain, MD Board Certified by the ABIM, [place]

## 2021-06-30 NOTE — Progress Notes (Addendum)
Brief progress Note See Full earlier progress note dated 7/12 at 5 am   Pt. Arrived to ICU awake and alert, on her phone with family. Heparin gtt is infusing per earlier note until PE is ruled out. Hemodynamically labile after HD., received one dose midodrine prior to transfer from 5 M  BP 97/56 ( 66) on arrival to ICU>> [name] order placed as patient did not respond to  Troponin is 149 TSH is 4.633 CBC is pending, BMET is pending Lactate is 1.7, WBC was 7.0 on 7/11, T Max last 24 was 98.5 BP did not respond to 1250 fluid bolus.  CTA is pending , Pretreat for IV dye allergy per the 13 hour protocol ( see orders ) She is NPO after 1800 pending findings of CTA.  7/11>> CXR Mild enlargement of cardiac silhouette, pulmonary venous HTN, no overt edema Last HD 06/29/2021 with 4 L off ( MWF), also had HD 7/10 with 3 L off She is on 2 L [place] with sats of 100%, no increased WOB, BS diminished per bases. We will re-evaluate after CTA., will continue to monitor Last Cultures 7/4 Blood >> No Growth Will not re culture at this time as she is afebrile and lactate is normal Will check cortisol level as add on  Magdalen Spatz, MSN, AGACNP-BC [place] for personal pager PCCM on call pager [phone]  06/30/2021

## 2021-06-30 NOTE — Progress Notes (Signed)
[facility] for heparin Indication: pulmonary embolus  Allergies  Allergen Reactions   Contrast Media [Iodinated Diagnostic Agents] Anaphylaxis, Hives, Swelling and Other (See Comments)    Dye for cardiac cath. Tongue swells   Pneumococcal Vaccines Swelling and Other (See Comments)    Turns skin black, and bodily swelling   Vancomycin Nausea And Vomiting and Other (See Comments)    Infusion "made me feel like I was dying" had to be readmitted to hospital    Patient Measurements: Height: _0  (165.1 cm) Weight: (!) 142.6 kg (314 lb 6 oz) IBW/kg (Calculated) : 57 Heparin Dosing Weight:92 kg  Vital Signs: Temp: 98 F (36.7 C) (07/12 1100) Temp Source: Oral (07/12 1100) BP: 97/56 (07/12 0812) Pulse Rate: 134 (07/12 0812)  Labs: Recent Labs    06/28/21 0241 06/29/21 0628 06/29/21 1534 06/29/21 1821 06/30/21 0609 06/30/21 0825 06/30/21 1403  HGB 9.6* 9.8*  --   --  9.5*  --   --   HCT 30.3* 30.8*  --   --  30.9*  --   --   PLT 180 197  --   --  201  --   --   HEPARINUNFRC  --   --   --   --   --   --  0.49  CREATININE  --  6.75*  --   --  5.18* 5.36*  --   TROPONINIHS  --   --    < > 162* 149* 168*  --    < > = values in this interval not displayed.     Estimated Creatinine Clearance: 17.3 mL/min (A) (by C-G formula based on SCr of 5.36 mg/dL (H)).   Medical History: Past Medical History:  Diagnosis Date   Allergy    Phreesia 11/04/2020   Anemia, unspecified    Anxiety    Arthritis    Phreesia 11/04/2020   Asthma    as a child   Cancer ([place])    thyroid   Cellulitis    CHF (congestive heart failure) ([place])    Chiari malformation    s/p surgery   Chiari malformation    Chronic kidney disease    Phreesia 11/04/2020   Chronic kidney disease, stage 5 (HCC)    Chronic obstructive pulmonary disease, unspecified ([place])    Chronic pain    Complication of anesthesia 11/28/15   Resp arrest after  conscious  sedation    Compression of brain (HCC)    Constipation    COPD (chronic obstructive pulmonary disease) ([place])    Phreesia 11/04/2020   Coronary artery disease    40-50% mid LAD 04/29/09, Medical tx. (Dr. Gwenlyn Found)   Diabetes mellitus    Type II- reports being off all medication d/t it being controlled   Diabetes mellitus without complication ([place])    Phreesia 11/04/2020   Essential (primary) hypertension    Fibromyalgia    Fibromyalgia    History of blood transfusion    hemorrage duinrg pregancy   Hx of echocardiogram 10/2011   EF 55-60%   Hypercholesterolemia    Hyperlipidemia, unspecified    Hypertension    Hypertensive chronic kidney disease with stage 1 through stage 4 chronic kidney disease, or unspecified chronic kidney disease    Hypertensive chronic kidney disease with stage 5 chronic kidney disease or end stage renal disease (HCC)    Lymph edema    Lymphedema, not elsewhere classified    Morbid (severe) obesity due to excess  calories ([place])    Obesity hypoventilation syndrome ([place])    On home oxygen therapy    "2L; 24/7" (12/21/2018)   Peritonitis, unspecified ([place])    Pneumonia    in past   Pulmonary hypertension ([place])    Renal disorder    M/W/F [facility] Pt started dialysis in Dec.2016   S/P colonoscopy 05/26/2007   Dr. Laural Golden sigmoid diverticulosis random biopsies benign   S/P endoscopy 05/01/2009   Dr. Penelope Coop pill-induced esophageal ulcerations distal to midesophagus, 2 small ulcers in the antrum of the stomach   Shortness of breath dyspnea    with any exertion or if heart rate  is irregular while on dialysis   Sleep apnea    reports that she no longer needs CPAP due to weight loss   Sleep apnea, unspecified    Type 2 diabetes mellitus with diabetic nephropathy ([place])    Type 2 diabetes mellitus with hyperglycemia (HCC)     Medications:  Medications Prior to Admission  Medication Sig Dispense Refill Last Dose   acetaminophen (TYLENOL) 325 MG tablet Take 2 tablets  (650 mg total) by mouth every 4 (four) hours as needed for mild pain (or Fever >/= 101). 15 tablet 1 PRN   calcium acetate (PHOSLO) 667 MG capsule Take 1,334 mg by mouth 3 (three) times daily.   06/21/2021   calcium carbonate (TUMS - DOSED IN MG ELEMENTAL CALCIUM) 500 MG chewable tablet Chew 2 tablets by mouth 2 (two) times daily.   06/21/2021   cephALEXin (KEFLEX) 500 MG capsule Take 1 capsule (500 mg total) by mouth 2 (two) times daily. Try to take this every 12 hours. On dialysis days, take the first dose after dialysis and the second at bedtime. 10 capsule 0 06/21/2021   ibuprofen (ADVIL) 200 MG tablet Take 400 mg by mouth every 6 (six) hours as needed for moderate pain.   Past Week   lanthanum (FOSRENOL) 500 MG chewable tablet Chew 1,000-1,500 mg by mouth 5 (five) times daily. 1500 mg by mouth three times a day with meals and 1000 mg by mouth twice a day with snacks   06/21/2021   loperamide (IMODIUM) 2 MG capsule Take 2-4 mg by mouth as needed for diarrhea or loose stools.    PRN   multivitamin (RENA-VIT) TABS tablet Take 1 tablet by mouth daily.  11 06/21/2021   Oxycodone HCl 10 MG TABS Take 1 tablet (10 mg total) by mouth 4 (four) times daily as needed (pain). 10 tablet 0 06/21/2021   Sennosides (EX-LAX PO) Take 2 tablets by mouth daily as needed (constipation).   PRN   clindamycin (CLEOCIN) 300 MG capsule Take 1 capsule (300 mg total) by mouth 3 (three) times daily. 16 capsule 0    lactulose (CHRONULAC) 10 GM/15ML solution Take 45 mLs (30 g total) by mouth 2 (two) times daily. For constipation (Patient not taking: Reported on 06/21/2021) 236 mL 0 Not Taking    Assessment: 55 yo female on heparin until PE ruled out (Chest CTA planned).  She was not on anticoagulation PTA -heparin level at goal -hg= 9.5  Goal of Therapy:  Heparin level 0.3-0.7 units/ml Monitor platelets by anticoagulation protocol: Yes   Plan:  -Continue heparin at 1650 units/hr -Confirm heparin level later today -Daily heparin  level and CBC  Hildred Laser, PharmD Clinical Pharmacist **Pharmacist phone directory can now be found on [email or web address] ([email or web address]).  Listed under [place].

## 2021-06-30 NOTE — Significant Event (Signed)
The nurse notified me about that patient was hypotensive with a blood pressure systolic in the 20N heart rate in the 130s.  On exam at bedside patient is not in distress denies chest pain or shortness of breath has some right lower extremity pain.  Patient was afebrile.  I ordered lactic acid which came back as normal I ordered a liter of normal saline bolus following which patient was still hypotensive with systolic in the 47S.  At this point I consulted critical care who requested transfer to ICU also get a CT angiogram of the chest to rule out pulmonary embolism and until then start patient on heparin for PE.  We will also be cycling cardiac markers checking TSH.  Patient is agreeable to getting CT angiogram of the chest with contrast after preparing with steroids since patient has previous episodes of angioedema due to contrast allergy but did have cardiac cath previously after being treated with steroids.  Gean [place]ch[place]

## 2021-06-30 NOTE — TOC Progression Note (Signed)
Transition of Care [facility]) - Progression Note    Patient Details  Name: [name] MRN: [id number] Date of Birth: 1966/09/24  Transition of Care [facility]) CM/SW [place], [place] Phone Number: 06/30/2021, 3:05 PM  Clinical Narrative:     CSW spoke with patient at bedside. CSW provided patient with SNF bed offers to review. Patient chose SNF placement at [facility] when medically ready. CSW called Debbie with [facility] to confirm SNF bed offer. CSW awaiting callback. CSW will continue to follow and assist with discharge planning needs.  Expected Discharge Plan: Skilled Nursing Facility Barriers to Discharge: Continued Medical Work up, Ship broker  Expected Discharge Plan and Services Expected Discharge Plan: [place] In-house Referral: Clinical Social Work   Post Acute Care Choice: [place] Living arrangements for the past 2 months: Single Family Home                                       Social Determinants of Health (SDOH) Interventions Food Insecurity Interventions: Intervention Not Indicated Financial Strain Interventions: Intervention Not Indicated Housing Interventions: Intervention Not Indicated Transportation Interventions: Intervention Not Indicated  Readmission Risk Interventions Readmission Risk Prevention Plan 04/30/2019  Transportation Screening Complete  Medication Review Press photographer) Complete  PCP or Specialist appointment within 3-5 days of discharge Complete  HRI or [place] Complete  SW Recovery Care/Counseling Consult Complete  [place] Not Applicable  Some recent data might be hidden

## 2021-06-30 NOTE — Progress Notes (Signed)
OT Cancellation Note  Patient Details Name: [name] MRN: [id number] DOB: February 03, 1966   Cancelled Treatment:    Reason Eval/Treat Not Completed: Medical issues which prohibited therapy. Events noted early this a.m. with SBP in 70's and HR in 130s. Patient transferred to [facility] for further work-up. OT to hold this date. Will attempt again 7/12.  Gloris [place] OTR/L Supplemental OT, Department of rehab services [phone]  [name] 06/30/2021, 8:13 AM

## 2021-06-30 NOTE — Progress Notes (Signed)
[facility] for heparin Indication: pulmonary embolus  Allergies  Allergen Reactions   Contrast Media [Iodinated Diagnostic Agents] Anaphylaxis, Hives, Swelling and Other (See Comments)    Dye for cardiac cath. Tongue swells   Pneumococcal Vaccines Swelling and Other (See Comments)    Turns skin black, and bodily swelling   Vancomycin Nausea And Vomiting and Other (See Comments)    Infusion "made me feel like I was dying" had to be readmitted to hospital    Patient Measurements: Height: _0  (165.1 cm) Weight: (!) 142.6 kg (314 lb 6 oz) IBW/kg (Calculated) : 57 Heparin Dosing Weight:92 kg  Vital Signs: Temp: 98.1 F (36.7 C) (07/12 1925) Temp Source: Oral (07/12 1925) BP: 105/68 (07/12 2130) Pulse Rate: 131 (07/12 2130)  Labs: Recent Labs    06/28/21 0241 06/29/21 0628 06/29/21 1534 06/29/21 1821 06/30/21 0609 06/30/21 0825 06/30/21 1403 06/30/21 2138  HGB 9.6* 9.8*  --   --  9.5*  --   --   --   HCT 30.3* 30.8*  --   --  30.9*  --   --   --   PLT 180 197  --   --  201  --   --   --   HEPARINUNFRC  --   --   --   --   --   --  0.49 0.46  CREATININE  --  6.75*  --   --  5.18* 5.36*  --   --   TROPONINIHS  --   --    < > 162* 149* 168*  --   --    < > = values in this interval not displayed.    Estimated Creatinine Clearance: 17.3 mL/min (A) (by C-G formula based on SCr of 5.36 mg/dL (H)).   Medical History: Past Medical History:  Diagnosis Date   Allergy    Phreesia 11/04/2020   Anemia, unspecified    Anxiety    Arthritis    Phreesia 11/04/2020   Asthma    as a child   Cancer ([place])    thyroid   Cellulitis    CHF (congestive heart failure) ([place])    Chiari malformation    s/p surgery   Chiari malformation    Chronic kidney disease    Phreesia 11/04/2020   Chronic kidney disease, stage 5 (HCC)    Chronic obstructive pulmonary disease, unspecified ([place])    Chronic pain    Complication of anesthesia 11/28/15    Resp arrest after  conscious  sedation   Compression of brain (HCC)    Constipation    COPD (chronic obstructive pulmonary disease) ([place])    Phreesia 11/04/2020   Coronary artery disease    40-50% mid LAD 04/29/09, Medical tx. (Dr. Gwenlyn Found)   Diabetes mellitus    Type II- reports being off all medication d/t it being controlled   Diabetes mellitus without complication ([place])    Phreesia 11/04/2020   Essential (primary) hypertension    Fibromyalgia    Fibromyalgia    History of blood transfusion    hemorrage duinrg pregancy   Hx of echocardiogram 10/2011   EF 55-60%   Hypercholesterolemia    Hyperlipidemia, unspecified    Hypertension    Hypertensive chronic kidney disease with stage 1 through stage 4 chronic kidney disease, or unspecified chronic kidney disease    Hypertensive chronic kidney disease with stage 5 chronic kidney disease or end stage renal disease (HCC)    Lymph  edema    Lymphedema, not elsewhere classified    Morbid (severe) obesity due to excess calories (HCC)    Obesity hypoventilation syndrome ([place])    On home oxygen therapy    "2L; 24/7" (12/21/2018)   Peritonitis, unspecified ([place])    Pneumonia    in past   Pulmonary hypertension ([place])    Renal disorder    M/W/F [facility] Pt started dialysis in Dec.2016   S/P colonoscopy 05/26/2007   Dr. Laural Golden sigmoid diverticulosis random biopsies benign   S/P endoscopy 05/01/2009   Dr. Penelope Coop pill-induced esophageal ulcerations distal to midesophagus, 2 small ulcers in the antrum of the stomach   Shortness of breath dyspnea    with any exertion or if heart rate  is irregular while on dialysis   Sleep apnea    reports that she no longer needs CPAP due to weight loss   Sleep apnea, unspecified    Type 2 diabetes mellitus with diabetic nephropathy ([place])    Type 2 diabetes mellitus with hyperglycemia (HCC)     Medications:  Medications Prior to Admission  Medication Sig Dispense Refill Last Dose   acetaminophen  (TYLENOL) 325 MG tablet Take 2 tablets (650 mg total) by mouth every 4 (four) hours as needed for mild pain (or Fever >/= 101). 15 tablet 1 PRN   calcium acetate (PHOSLO) 667 MG capsule Take 1,334 mg by mouth 3 (three) times daily.   06/21/2021   calcium carbonate (TUMS - DOSED IN MG ELEMENTAL CALCIUM) 500 MG chewable tablet Chew 2 tablets by mouth 2 (two) times daily.   06/21/2021   cephALEXin (KEFLEX) 500 MG capsule Take 1 capsule (500 mg total) by mouth 2 (two) times daily. Try to take this every 12 hours. On dialysis days, take the first dose after dialysis and the second at bedtime. 10 capsule 0 06/21/2021   ibuprofen (ADVIL) 200 MG tablet Take 400 mg by mouth every 6 (six) hours as needed for moderate pain.   Past Week   lanthanum (FOSRENOL) 500 MG chewable tablet Chew 1,000-1,500 mg by mouth 5 (five) times daily. 1500 mg by mouth three times a day with meals and 1000 mg by mouth twice a day with snacks   06/21/2021   loperamide (IMODIUM) 2 MG capsule Take 2-4 mg by mouth as needed for diarrhea or loose stools.    PRN   multivitamin (RENA-VIT) TABS tablet Take 1 tablet by mouth daily.  11 06/21/2021   Oxycodone HCl 10 MG TABS Take 1 tablet (10 mg total) by mouth 4 (four) times daily as needed (pain). 10 tablet 0 06/21/2021   Sennosides (EX-LAX PO) Take 2 tablets by mouth daily as needed (constipation).   PRN   clindamycin (CLEOCIN) 300 MG capsule Take 1 capsule (300 mg total) by mouth 3 (three) times daily. 16 capsule 0    lactulose (CHRONULAC) 10 GM/15ML solution Take 45 mLs (30 g total) by mouth 2 (two) times daily. For constipation (Patient not taking: Reported on 06/21/2021) 236 mL 0 Not Taking    Assessment: 55 yo female on heparin until PE ruled out (Chest CTA planned).  She was not on anticoagulation PTA.   Heparin level remains therapeutic and stable ( 0.49 >> 0.46) on current rate of Heparin 1650 units/hr. No bleeding noted or issues with infusion.   Goal of Therapy:  Heparin level 0.3-0.7  units/ml Monitor platelets by anticoagulation protocol: Yes   Plan:  -Continue heparin at 1650 units/hr -Daily heparin level and CBC  Sloan Leiter, PharmD, BCPS, BCCCP Clinical Pharmacist Please refer to [facility] for [place] numbers 06/30/2021, 10:33 PM

## 2021-06-30 NOTE — Progress Notes (Signed)
PT Cancellation Note  Patient Details Name: [name] MRN: [id number] DOB: 14-Mar-1966   Cancelled Treatment:    Reason Eval/Treat Not Completed: Medical issues which prohibited therapy  Significant hypotension and tachycardia earlier this am, with plans for transfer to ICU and further workup;  Will hold PT, and follow up as able for appropriateness of PT/mobility;  Roney Marion, [place] Pager [phone] Office [phone]    Colletta [place] 06/30/2021, 7:15 AM

## 2021-06-30 NOTE — Progress Notes (Signed)
[facility] Progress Note    Assessment/ Plan:   # ESRD: Outpatient orders: [facility], Monday Wednesday Friday, 255 minutes.  350 BFR/500 DFR  1K, 2.5 calcium, 138 sodium, EDW 135.5 kg, 15-gauge needles, machine temp 37 -Had UF 4L 7/11 but well over EDW - had planned for DUF today but hold in light of hypotension - she has AVF and would need line for CRRT -Plan for usual HD tomorrow if BP ok, if not will need line and CRRT - discussed   Acute on chronic RV failure with shock Sepsis, septic shock likely secondary to cellulitis -abx per ccm/primary (completed dapto) -possible that her AVF is a contributing factor, will need to have this looked as an outpatient unless she is not making any improvements then would need vascular surgery to evaluate her for possible ligation -CTA pending today r/o PE -HF signed off inpt.  -on midodrine 10 TID    Volume/ hypertension: EDW 135.5kg. Will UF as tolerated, well over. Unable to do extra UF today in light of hypotesion.    Anemia of Chronic Kidney Disease: Currently receiving epo 12k units and venofer 68m. Holding on IV iron   Secondary Hyperparathyroidism/Hyperphosphatemia: sensipar 60 qtx    DM2 with hyperglycemia: mgmt per primary service   # Additional recommendations: - Dose all meds for creatinine clearance < 10 ml/min  - Unless absolutely necessary, no MRIs with gadolinium.  - Implement save arm precautions.  Prefer needle sticks in the dorsum of the hands or wrists.  No blood pressure measurements in arm. - If blood transfusion is requested during hemodialysis sessions, please alert uKoreaprior to the session. - If a hemodialysis catheter line culture is requested, please alert u[place]oreaas only hemodialysis nurses are able to collect those specimens.   LJannifer HickMD [facility] Pager [phone]  Subjective:   Moved to ICU overnight due to hypotension SBP 70s not responsive to fluids.  Premed  underway for CTA chest r/o PE.   She feels ok currently.    Objective:   BP (!) 97/56 (BP Location: Right Arm)   Pulse (!) 134   Temp 98.1 F (36.7 C) (Oral)   Resp (!) 24   Ht _0  (1.651 m)   Wt (!) 142.6 kg   SpO2 100%   BMI 52.31 kg/m   Intake/Output Summary (Last 24 hours) at 06/30/2021 1024 Last data filed at 06/30/2021 0845 Gross per 24 hour  Intake 1218.3 ml  Output 4000 ml  Net -2781.7 ml    Weight change:   Physical Exam: Gen:nad, sitting up in bed CVS:s1s2, rrr Resp: normal wob APIR:JJOAC soft, nt EZYS:[id number] LE's w/ chronic skin changes Neuro: awake, alert, moves all ext spontaneously, speech clear and coherent HD access: lue avf +b/t   Imaging: DG Chest Port 1 View  Result Date: 06/29/2021 CLINICAL DATA:  Chest pain. EXAM: PORTABLE CHEST 1 VIEW COMPARISON:  06/23/2021 FINDINGS: Mild enlargement of the cardiopericardial silhouette. Atherosclerotic calcification of the aortic arch. Indistinct pulmonary vasculature suggesting pulmonary venous hypertension. No overt edema. Linear subsegmental atelectasis at the right lung base. No blunting of the lateral costophrenic angles. Absent distal right clavicle. IMPRESSION: 1. Mild enlargement of the cardiopericardial silhouette with suspected pulmonary venous hypertension but no overt edema. No blunting of the costophrenic angles. 2.  Aortic Atherosclerosis (ICD10-I70.0). Electronically Signed   By: WVan ClinesM.D.   On: 06/29/2021 20:57    Labs: BMET Recent Labs  Lab 06/24/21 1427 06/27/21 0140  06/29/21 0628 06/30/21 0609  NA 132* 133* 130* 134*  K 3.8 4.0 4.3 3.9  CL 96* 99 93* 97*  CO2 _0 GLUCOSE 173* 141* 102* 107*  BUN 42* 38* 30* 17  CREATININE 7.51* 7.81* 6.75* 5.18*  CALCIUM 7.5* 8.1* 8.4* 8.5*  PHOS 6.0* 5.1* 5.3*  --     CBC Recent Labs  Lab 06/27/21 0140 06/28/21 0241 06/29/21 0628 06/30/21 0609  WBC 7.9 8.5 7.0 6.3  HGB 9.4* 9.6* 9.8* 9.5*  HCT 28.8* 30.3*  30.8* 30.9*  MCV 97.6 98.7 99.0 100.0  PLT 177 180 197 201     Medications:     aspirin  81 mg Oral Daily   calcium acetate  667 mg Oral TID WC   Chlorhexidine Gluconate Cloth  6 each Topical Daily   COVID-19 mRNA vaccine (Moderna)  0.25 mL Intramuscular ONCE-1600   diphenhydrAMINE  50 mg Oral Once   Or   diphenhydrAMINE  50 mg Intravenous Once   mouth rinse  15 mL Mouth Rinse BID   midodrine  10 mg Oral TID WC   predniSONE  50 mg Oral Q6H    Jannifer Hick MD [facility] Pager [phone]

## 2021-06-30 NOTE — Progress Notes (Signed)
Patient transferred to [facility].

## 2021-06-30 NOTE — Progress Notes (Signed)
Responded to consult for IV; discussed needs with RN and came to consensus that current access is sufficient for ordered treatment plan.

## 2021-06-30 NOTE — Progress Notes (Signed)
ANTICOAGULATION CONSULT NOTE - Initial Consult  Pharmacy Consult for heparin Indication: pulmonary embolus  Allergies  Allergen Reactions   Contrast Media [Iodinated Diagnostic Agents] Anaphylaxis, Hives, Swelling and Other (See Comments)    Dye for cardiac cath. Tongue swells   Pneumococcal Vaccines Swelling and Other (See Comments)    Turns skin black, and bodily swelling   Vancomycin Nausea And Vomiting and Other (See Comments)    Infusion "made me feel like I was dying" had to be readmitted to hospital    Patient Measurements: Height: _0  (165.1 cm) Weight: (!) 142.6 kg (314 lb 6 oz) IBW/kg (Calculated) : 57 Heparin Dosing Weight:92 kg  Vital Signs: Temp: 98.5 F (36.9 C) (07/11 2131) BP: 91/59 (07/12 0506) Pulse Rate: 127 (07/12 0506)  Labs: Recent Labs    06/28/21 0241 06/29/21 0628 06/29/21 1534 06/29/21 1821  HGB 9.6* 9.8*  --   --   HCT 30.3* 30.8*  --   --   PLT 180 197  --   --   CREATININE  --  6.75*  --   --   TROPONINIHS  --   --  152* 162*    Estimated Creatinine Clearance: 13.7 mL/min (A) (by C-G formula based on SCr of 6.75 mg/dL (H)).   Medical History: Past Medical History:  Diagnosis Date   Allergy    Phreesia 11/04/2020   Anemia, unspecified    Anxiety    Arthritis    Phreesia 11/04/2020   Asthma    as a child   Cancer ([place])    thyroid   Cellulitis    CHF (congestive heart failure) ([place])    Chiari malformation    s/p surgery   Chiari malformation    Chronic kidney disease    Phreesia 11/04/2020   Chronic kidney disease, stage 5 (HCC)    Chronic obstructive pulmonary disease, unspecified ([place])    Chronic pain    Complication of anesthesia 11/28/15   Resp arrest after  conscious  sedation   Compression of brain (HCC)    Constipation    COPD (chronic obstructive pulmonary disease) ([place])    Phreesia 11/04/2020   Coronary artery disease    40-50% mid LAD 04/29/09, Medical tx. (Dr. Gwenlyn Found)   Diabetes mellitus    Type II-  reports being off all medication d/t it being controlled   Diabetes mellitus without complication ([place])    Phreesia 11/04/2020   Essential (primary) hypertension    Fibromyalgia    Fibromyalgia    History of blood transfusion    hemorrage duinrg pregancy   Hx of echocardiogram 10/2011   EF 55-60%   Hypercholesterolemia    Hyperlipidemia, unspecified    Hypertension    Hypertensive chronic kidney disease with stage 1 through stage 4 chronic kidney disease, or unspecified chronic kidney disease    Hypertensive chronic kidney disease with stage 5 chronic kidney disease or end stage renal disease (HCC)    Lymph edema    Lymphedema, not elsewhere classified    Morbid (severe) obesity due to excess calories (HCC)    Obesity hypoventilation syndrome ([place])    On home oxygen therapy    "2L; 24/7" (12/21/2018)   Peritonitis, unspecified ([place])    Pneumonia    in past   Pulmonary hypertension ([place])    Renal disorder    M/W/F [facility] Pt started dialysis in Dec.2016   S/P colonoscopy 05/26/2007   Dr. Laural Golden sigmoid diverticulosis random biopsies benign   S/P endoscopy 05/01/2009  Dr. Penelope Coop pill-induced esophageal ulcerations distal to midesophagus, 2 small ulcers in the antrum of the stomach   Shortness of breath dyspnea    with any exertion or if heart rate  is irregular while on dialysis   Sleep apnea    reports that she no longer needs CPAP due to weight loss   Sleep apnea, unspecified    Type 2 diabetes mellitus with diabetic nephropathy (HCC)    Type 2 diabetes mellitus with hyperglycemia (HCC)     Medications:  Medications Prior to Admission  Medication Sig Dispense Refill Last Dose   acetaminophen (TYLENOL) 325 MG tablet Take 2 tablets (650 mg total) by mouth every 4 (four) hours as needed for mild pain (or Fever >/= 101). 15 tablet 1 PRN   calcium acetate (PHOSLO) 667 MG capsule Take 1,334 mg by mouth 3 (three) times daily.   06/21/2021   calcium carbonate (TUMS - DOSED  IN MG ELEMENTAL CALCIUM) 500 MG chewable tablet Chew 2 tablets by mouth 2 (two) times daily.   06/21/2021   cephALEXin (KEFLEX) 500 MG capsule Take 1 capsule (500 mg total) by mouth 2 (two) times daily. Try to take this every 12 hours. On dialysis days, take the first dose after dialysis and the second at bedtime. 10 capsule 0 06/21/2021   ibuprofen (ADVIL) 200 MG tablet Take 400 mg by mouth every 6 (six) hours as needed for moderate pain.   Past Week   lanthanum (FOSRENOL) 500 MG chewable tablet Chew 1,000-1,500 mg by mouth 5 (five) times daily. 1500 mg by mouth three times a day with meals and 1000 mg by mouth twice a day with snacks   06/21/2021   loperamide (IMODIUM) 2 MG capsule Take 2-4 mg by mouth as needed for diarrhea or loose stools.    PRN   multivitamin (RENA-VIT) TABS tablet Take 1 tablet by mouth daily.  11 06/21/2021   Oxycodone HCl 10 MG TABS Take 1 tablet (10 mg total) by mouth 4 (four) times daily as needed (pain). 10 tablet 0 06/21/2021   Sennosides (EX-LAX PO) Take 2 tablets by mouth daily as needed (constipation).   PRN   clindamycin (CLEOCIN) 300 MG capsule Take 1 capsule (300 mg total) by mouth 3 (three) times daily. 16 capsule 0    lactulose (CHRONULAC) 10 GM/15ML solution Take 45 mLs (30 g total) by mouth 2 (two) times daily. For constipation (Patient not taking: Reported on 06/21/2021) 236 mL 0 Not Taking    Assessment: 55 yo lady COVID positive to start heparin until PE ruled out.  She was not on anticoagulation PTA but was on sq heparin for VTE px. Hg 9.8, PTLC 197 Goal of Therapy:  Heparin level 0.3-0.7 units/ml Monitor platelets by anticoagulation protocol: Yes   Plan:  Heparin bolus 7000 units and drip at 1650 units/hr Check heparin level in ~ 8 hours F/u CT.  Excell Seltzer [place] 06/30/2021,5:12 AM

## 2021-07-01 ENCOUNTER — Inpatient Hospital Stay (HOSPITAL_COMMUNITY): Payer: 59

## 2021-07-01 ENCOUNTER — Other Ambulatory Visit (HOSPITAL_COMMUNITY): Payer: Self-pay

## 2021-07-01 DIAGNOSIS — R570 Cardiogenic shock: Secondary | ICD-10-CM | POA: Diagnosis not present

## 2021-07-01 DIAGNOSIS — I272 Pulmonary hypertension, unspecified: Secondary | ICD-10-CM | POA: Diagnosis not present

## 2021-07-01 LAB — CBC
HCT: 33.1 % — ABNORMAL LOW (ref 36.0–46.0)
Hemoglobin: 10.7 g/dL — ABNORMAL LOW (ref 12.0–15.0)
MCH: 31.9 pg (ref 26.0–34.0)
MCHC: 32.3 g/dL (ref 30.0–36.0)
MCV: 98.8 fL (ref 80.0–100.0)
Platelets: 202 10*3/uL (ref 150–400)
RBC: 3.35 MIL/uL — ABNORMAL LOW (ref 3.87–5.11)
RDW: 15.8 % — ABNORMAL HIGH (ref 11.5–15.5)
WBC: 5.1 10*3/uL (ref 4.0–10.5)
nRBC: 0.4 % — ABNORMAL HIGH (ref 0.0–0.2)

## 2021-07-01 LAB — GLUCOSE, CAPILLARY
Glucose-Capillary: 197 mg/dL — ABNORMAL HIGH (ref 70–99)
Glucose-Capillary: 212 mg/dL — ABNORMAL HIGH (ref 70–99)
Glucose-Capillary: 165 mg/dL — ABNORMAL HIGH (ref 70–99)
Glucose-Capillary: 186 mg/dL — ABNORMAL HIGH (ref 70–99)
Glucose-Capillary: 193 mg/dL — ABNORMAL HIGH (ref 70–99)

## 2021-07-01 LAB — BASIC METABOLIC PANEL
Anion gap: 12 (ref 5–15)
BUN: 28 mg/dL — ABNORMAL HIGH (ref 6–20)
CO2: 24 mmol/L (ref 22–32)
Calcium: 7.9 mg/dL — ABNORMAL LOW (ref 8.9–10.3)
Chloride: 97 mmol/L — ABNORMAL LOW (ref 98–111)
Creatinine, Ser: 6.44 mg/dL — ABNORMAL HIGH (ref 0.44–1.00)
GFR, Estimated: 7 mL/min — ABNORMAL LOW (ref >60)
Glucose, Bld: 207 mg/dL — ABNORMAL HIGH (ref 70–99)
Potassium: 5 mmol/L (ref 3.5–5.1)
Sodium: 133 mmol/L — ABNORMAL LOW (ref 135–145)

## 2021-07-01 LAB — BRAIN NATRIURETIC PEPTIDE: B Natriuretic Peptide: 2062.9 pg/mL — ABNORMAL HIGH (ref 0.0–100.0)

## 2021-07-01 LAB — LACTATE DEHYDROGENASE: LDH: 167 U/L (ref 98–192)

## 2021-07-01 LAB — TROPONIN I (HIGH SENSITIVITY): Troponin I (High Sensitivity): 117 ng/L (ref <18)

## 2021-07-01 MED ORDER — AMIODARONE HCL 200 MG PO TABS
200.0000 mg | ORAL_TABLET | Freq: Every day | ORAL | Status: DC
  Administered 2021-07-01: 200 mg via ORAL
  Filled 2021-07-01: qty 1

## 2021-07-01 MED ORDER — INSULIN ASPART 100 UNIT/ML IJ SOLN
1.0000 [IU] | INTRAMUSCULAR | Status: DC
  Administered 2021-07-01: 3 [IU] via SUBCUTANEOUS
  Administered 2021-07-01: 2 [IU] via SUBCUTANEOUS

## 2021-07-01 MED ORDER — INSULIN ASPART 100 UNIT/ML IJ SOLN
0.0000 [IU] | Freq: Every day | INTRAMUSCULAR | Status: DC
  Administered 2021-08-13: 5 [IU] via SUBCUTANEOUS

## 2021-07-01 MED ORDER — HEPARIN SODIUM (PORCINE) 5000 UNIT/ML IJ SOLN: 5000.0000 [IU] | Freq: Three times a day (TID) | INTRAMUSCULAR | Status: DC

## 2021-07-01 MED ORDER — APIXABAN 5 MG PO TABS
5.0000 mg | ORAL_TABLET | Freq: Two times a day (BID) | ORAL | Status: DC
  Administered 2021-07-01: 5 mg via ORAL
  Filled 2021-07-01: qty 1

## 2021-07-01 MED ORDER — SODIUM CHLORIDE 0.9% FLUSH
3.0000 mL | Freq: Two times a day (BID) | INTRAVENOUS | Status: DC
  Administered 2021-07-01 – 2021-07-18 (×31): 3 mL via INTRAVENOUS

## 2021-07-01 MED ORDER — INSULIN ASPART 100 UNIT/ML IJ SOLN
0.0000 [IU] | Freq: Three times a day (TID) | INTRAMUSCULAR | Status: DC
  Administered 2021-07-01 – 2021-07-02 (×4): 3 [IU] via SUBCUTANEOUS
  Administered 2021-07-02: 5 [IU] via SUBCUTANEOUS
  Administered 2021-07-04 (×2): 3 [IU] via SUBCUTANEOUS
  Administered 2021-07-05 – 2021-07-06 (×3): 2 [IU] via SUBCUTANEOUS
  Administered 2021-07-07: 5 [IU] via SUBCUTANEOUS
  Administered 2021-07-08: 3 [IU] via SUBCUTANEOUS
  Administered 2021-07-08: 2 [IU] via SUBCUTANEOUS
  Administered 2021-07-09: 3 [IU] via SUBCUTANEOUS
  Administered 2021-07-09 – 2021-07-10 (×3): 2 [IU] via SUBCUTANEOUS
  Administered 2021-07-10 (×2): 3 [IU] via SUBCUTANEOUS
  Administered 2021-07-11: 2 [IU] via SUBCUTANEOUS
  Administered 2021-07-13: 3 [IU] via SUBCUTANEOUS
  Administered 2021-07-14: 2 [IU] via SUBCUTANEOUS
  Administered 2021-07-14: 3 [IU] via SUBCUTANEOUS
  Administered 2021-07-15: 2 [IU] via SUBCUTANEOUS
  Administered 2021-07-16: 3 [IU] via SUBCUTANEOUS
  Administered 2021-07-16: 2 [IU] via SUBCUTANEOUS
  Administered 2021-07-17: 3 [IU] via SUBCUTANEOUS
  Administered 2021-07-18 – 2021-07-19 (×3): 2 [IU] via SUBCUTANEOUS
  Administered 2021-07-21: 3 [IU] via SUBCUTANEOUS
  Administered 2021-07-22 – 2021-07-23 (×4): 2 [IU] via SUBCUTANEOUS
  Administered 2021-07-25: 3 [IU] via SUBCUTANEOUS
  Administered 2021-07-26 – 2021-08-02 (×8): 2 [IU] via SUBCUTANEOUS
  Administered 2021-08-06 (×2): 3 [IU] via SUBCUTANEOUS
  Administered 2021-08-06: 2 [IU] via SUBCUTANEOUS
  Administered 2021-08-07: 5 [IU] via SUBCUTANEOUS
  Administered 2021-08-08: 3 [IU] via SUBCUTANEOUS
  Administered 2021-08-08: 2 [IU] via SUBCUTANEOUS
  Administered 2021-08-09: 5 [IU] via SUBCUTANEOUS
  Administered 2021-08-10 – 2021-08-11 (×3): 2 [IU] via SUBCUTANEOUS
  Administered 2021-08-13: 5 [IU] via SUBCUTANEOUS
  Administered 2021-08-13: 2 [IU] via SUBCUTANEOUS
  Administered 2021-08-13 – 2021-08-14 (×2): 3 [IU] via SUBCUTANEOUS
  Administered 2021-08-14: 8 [IU] via SUBCUTANEOUS
  Administered 2021-08-15: 2 [IU] via SUBCUTANEOUS
  Administered 2021-08-15: 3 [IU] via SUBCUTANEOUS
  Administered 2021-08-15 – 2021-08-16 (×2): 2 [IU] via SUBCUTANEOUS
  Administered 2021-08-16: 3 [IU] via SUBCUTANEOUS
  Administered 2021-08-17 – 2021-08-18 (×5): 2 [IU] via SUBCUTANEOUS
  Administered 2021-08-20 (×3): 3 [IU] via SUBCUTANEOUS
  Administered 2021-08-21: 2 [IU] via SUBCUTANEOUS
  Administered 2021-08-21 – 2021-08-22 (×4): 3 [IU] via SUBCUTANEOUS
  Administered 2021-08-23 (×3): 2 [IU] via SUBCUTANEOUS
  Administered 2021-08-24: 3 [IU] via SUBCUTANEOUS

## 2021-07-01 MED ORDER — IOHEXOL 350 MG/ML SOLN
75.0000 mL | Freq: Once | INTRAVENOUS | Status: AC | PRN
  Administered 2021-07-01: 75 mL via INTRAVENOUS

## 2021-07-01 NOTE — Progress Notes (Signed)
Occupational Therapy Treatment Patient Details Name: [name] MRN: [id number] DOB: 08/02/1966 Today's Date: 07/01/2021    History of present illness 55 y.o. female presenting from [facility] 7/3 with lethargy, hypotension and elevated lactic acid after d/c from [facility] earlier that day. Admitted at [facility] several days ago with cellulitis, given IV antibiotics and d/c'd home secondary to passing of father. This admission patient found to have AMS, septic shock, acute respiratory failure, acute on chronic RV failure. Transferred to ICU on 7/12 due to hypotension. PMHX significant for obesity, DMII, COPD, CAD, lymphedema, ESRD on HD MWF and CHF.   OT comments  Patient seen in conjunction with PT.  Patient continues to require max assist +2 for bed mobility and sit to stand from EOB (unable to achieve full upright position).  Patient requires total assist for hygiene (+2 in partial stand and +1 for hygiene) due to incontinence of BM during session.  Requires total assist for LB dressing and setup for grooming at EOB. BP and O2 stable on RA, HR ranged from 130 (at rest) to 135 with activity. She is highly motivated but limited by BLE pain and edema, body habitus, and weakness.  Will continue to follow acutely, SNF remains appropriate.    Follow Up Recommendations  SNF;Supervision/Assistance - 24 hour    Equipment Recommendations  Other (comment) (defer to next level of care)    Recommendations for Other Services      Precautions / Restrictions Precautions Precautions: Fall Precaution Comments: monitor HR Restrictions Weight Bearing Restrictions: No       Mobility Bed Mobility Overal bed mobility: Needs Assistance Bed Mobility: Supine to Sit;Sit to Supine     Supine to sit: Max assist;HOB elevated;+2 for physical assistance (pt attempting to climb uphill to edge of bed due to bariatric air mattress) Sit to supine: Total assist (+3 assist)   General bed mobility comments: pt demonstrates  limited AROM of LEs, able to assist more with UEs to pull up into sitting    Transfers Overall transfer level: Needs assistance Equipment used: 2 person hand held assist Transfers: Sit to/from Stand Sit to Stand: Max assist;+2 physical assistance;From elevated surface         General transfer comment: floor mat placed under feet to allow feet to touch floor, bilateral knee block utilized for safety. Pt clears buttocks by 6-8 inches with 2 separate stand attempts, does not obtain upright trunk position    Balance Overall balance assessment: Needs assistance Sitting-balance support: Single extremity supported;Bilateral upper extremity supported;Feet supported Sitting balance-Leahy Scale: Poor Sitting balance - Comments: minA with UE support due to posterior lean Postural control: Posterior lean Standing balance support: Bilateral upper extremity supported Standing balance-Leahy Scale: Zero Standing balance comment: maxA x2                           ADL either performed or assessed with clinical judgement   ADL Overall ADL's : Needs assistance/impaired     Grooming: Set up;Sitting;Wash/dry face               Lower Body Dressing: Total assistance Lower Body Dressing Details (indicate cue type and reason): total assist to don socks     Toileting- Clothing Manipulation and Hygiene: Total assistance;+2 for physical assistance;+2 for safety/equipment;Sit to/from stand Toileting - Clothing Manipulation Details (indicate cue type and reason): partial stand for hygiene due to incontience BM--+2 to stand and 3rd person to complete hygiene  Functional mobility during ADLs: Maximal assistance;+2 for physical assistance;+2 for safety/equipment General ADL Comments: Patient continues to be limited by BLE edema, generalized weakness, inability to stand.     Vision       Perception     Praxis      Cognition Arousal/Alertness: Awake/alert Behavior During  Therapy: WFL for tasks assessed/performed Overall Cognitive Status: Within Functional Limits for tasks assessed                                          Exercises Exercises:  (PT encourages continued LE exercise of ankle pumps, hip abduction/adduction, heel slides. PT adds glute sets to HEP this session)   Shoulder Instructions       General Comments on 2L initally and weaned to RA; HR at rest 130s, max to 135 with activity.  All other VSS.    Pertinent Vitals/ Pain       Pain Assessment: Faces Faces Pain Scale: Hurts even more Pain Location: back, LEs Pain Descriptors / Indicators: Aching Pain Intervention(s): Monitored during session  Home Living                                          Prior Functioning/Environment              Frequency  Min 2X/week        Progress Toward Goals  OT Goals(current goals can now be found in the care plan section)  Progress towards OT goals: Progressing toward goals  Acute Rehab OT Goals Patient Stated Goal: To return home with husband and granddaughter. OT Goal Formulation: With patient  Plan Discharge plan remains appropriate;Frequency remains appropriate    Co-evaluation    PT/OT/SLP Co-Evaluation/Treatment: Yes Reason for Co-Treatment: For patient/therapist safety;To address functional/ADL transfers PT goals addressed during session: Mobility/safety with mobility;Balance;Strengthening/ROM OT goals addressed during session: ADL's and self-care      AM-PAC OT "6 Clicks" Daily Activity     Outcome Measure   Help from another person eating meals?: None Help from another person taking care of personal grooming?: A Little Help from another person toileting, which includes using toliet, bedpan, or urinal?: Total Help from another person bathing (including washing, rinsing, drying)?: Total Help from another person to put on and taking off regular upper body clothing?: A Little Help from  another person to put on and taking off regular lower body clothing?: Total 6 Click Score: 13    End of Session Equipment Utilized During Treatment: Oxygen;Gait belt  OT Visit Diagnosis: Other abnormalities of gait and mobility (R26.89);Muscle weakness (generalized) (M62.81);Pain Pain - Right/Left: Right Pain - part of body: Leg;Ankle and joints of foot (back)   Activity Tolerance Patient tolerated treatment well   Patient Left in bed;with call bell/phone within reach;with bed alarm set   Nurse Communication Mobility status        Time: 9276-3943 OT Time Calculation (min): 27 min  Charges: OT General Charges $OT Visit: 1 Visit OT Treatments $Self Care/Home Management : 8-22 mins  Jolaine Artist, [place] Pager [phone] Office [place] 07/01/2021, 10:54 AM

## 2021-07-01 NOTE — H&P (View-Only) (Signed)
Patient ID: [name], female   DOB: Mar 04, 1966, 55 y.o.   MRN: [id number]     Advanced Heart Failure Rounding Note  PCP-Cardiologist: Dr. Ross/ Dr. Aundra Dubin    Patient Profile:    55 y/o female w/ chronic diastolic CHF with prominent RV failure, pulmonary HTN, ESRD on HD admitted w/ septic shock 2/2 LE cellulitis. Septic shock resolved. Now off NE. Completed abx.   Pt seen in consultation by Dr. Aundra Dubin earlier this admit (see notes for full details). AHF team asked to revisit given issues w/ hypotension limiting HD. Was transferred back to ICU 7/12 for profound hypotension and tachycardia w/ HD and started on phenylephrine. PE has been ruled out. PAF captured on tele. Thought to be high output PH primarily in setting of excessive flow through the large left arm AVF. ?Need for fistula ligation. We are asked to assist w/ RHC w/ fistual occlusion to help better assess.   Subjective:    Now off [name]. Currently getting iHD at bedside. SBP 99. Received midodrine prior session. Sinus tach 130s on tele. Has had intermittent Afib. Now on PO amio and Eliquis. Feels tired. No other complaints.   Objective:   Weight Range: (!) 139.3 kg Body mass index is 51.09 kg/m.   Vital Signs:   Temp:  [97.7 F (36.5 C)-98.9 F (37.2 C)] 97.8 F (36.6 C) (07/13 0738) Pulse Rate:  [53-133] 131 (07/13 1015) Resp:  [15-35] 22 (07/13 1015) BP: (82-140)/(56-109) 85/68 (07/13 1015) SpO2:  [58 %-100 %] 95 % (07/13 1015) Weight:  [139.3 kg] 139.3 kg (07/13 0500) Last BM Date: 07/01/21  Weight change: Filed Weights   06/29/21 1208 06/30/21 0500 07/01/21 0500  Weight: (!) 142.6 kg (!) 142.6 kg (!) 139.3 kg    Intake/Output:   Intake/Output Summary (Last 24 hours) at 07/01/2021 1023 Last data filed at 07/01/2021 0800 Gross per 24 hour  Intake 1095.2 ml  Output --  Net 1095.2 ml      Physical Exam    General:  Well appearing, morbidly obese getting iHD at bedside. No respiratory difficulty HEENT:  normal Neck: supple. JVD elevated to ear. Carotids 2+ bilat; no bruits. No lymphadenopathy or thyromegaly appreciated. Cor: PMI nondisplaced. Regular rhythm tachy rate No rubs, gallops or murmurs. Lungs: clear Abdomen: soft, nontender, nondistended. No hepatosplenomegaly. No bruits or masses. Good bowel sounds. Extremities: no cyanosis, clubbing, rash, 1+ bilateral LE edema +RUE Fistula  Neuro: alert & oriented x 3, cranial nerves grossly intact. moves all 4 extremities w/o difficulty. Affect pleasant.  Telemetry   Sinus tach 130s, some runs of PAF   EKG    N/A  Labs    CBC Recent Labs    06/30/21 0609 07/01/21 0550  WBC 6.3 5.1  HGB 9.5* 10.7*  HCT 30.9* 33.1*  MCV 100.0 98.8  PLT 201 478   Basic Metabolic Panel Recent Labs    06/29/21 0628 06/30/21 0609 06/30/21 0825 07/01/21 0550  NA 130*   < > 134* 133*  K 4.3   < > 3.9 5.0  CL 93*   < > 96* 97*  CO2 25   < > 26 24  GLUCOSE 102*   < > 95 207*  BUN 30*   < > 18 28*  CREATININE 6.75*   < > 5.36* 6.44*  CALCIUM 8.4*   < > 8.5* 7.9*  MG  --   --  2.0  --   PHOS 5.3*  --   --   --    < > =  values in this interval not displayed.   Liver Function Tests Recent Labs    06/29/21 0628  ALBUMIN 2.8*   No results for input(s): LIPASE, AMYLASE in the last 72 hours. Cardiac Enzymes No results for input(s): CKTOTAL, CKMB, CKMBINDEX, TROPONINI in the last 72 hours.   BNP: BNP (last 3 results) Recent Labs    06/22/21 1400 06/29/21 1534 07/01/21 0550  BNP 590.8* 718.9* 2,062.9*    ProBNP (last 3 results) No results for input(s): PROBNP in the last 8760 hours.   D-Dimer No results for input(s): DDIMER in the last 72 hours.  Hemoglobin A1C No results for input(s): HGBA1C in the last 72 hours.  Fasting Lipid Panel No results for input(s): CHOL, HDL, LDLCALC, TRIG, CHOLHDL, LDLDIRECT in the last 72 hours. Thyroid Function Tests Recent Labs    06/30/21 0609  TSH 4.633*    Other results:   Imaging     CT Angio Chest Pulmonary Embolism (PE) W or WO Contrast  Result Date: 07/01/2021 CLINICAL DATA:  PE suspected EXAM: CT ANGIOGRAPHY CHEST WITH CONTRAST TECHNIQUE: Multidetector CT imaging of the chest was performed using the standard protocol during bolus administration of intravenous contrast. Multiplanar CT image reconstructions and MIPs were obtained to evaluate the vascular anatomy. CONTRAST:  75mL OMNIPAQUE IOHEXOL 350 MG/ML SOLN COMPARISON:  Radiograph 05/30/2021 FINDINGS: Cardiovascular: Borderline opacification of the pulmonary arteries. No central, lobar or segmental filling defects are demonstrated. Central pulmonary arteries are enlarged. There is also notable enlargement of the right atrium and ventricle on a background of diffuse cardiomegaly. Reflux of contrast into the hepatic veins and IVC. Coronary artery calcifications. Atherosclerotic plaque within the normal caliber aorta. No acute luminal abnormality of the imaged aorta. No periaortic stranding or hemorrhage. Normal 3 vessel branching of the aortic arch. Proximal great vessels are unremarkable. Mediastinum/Nodes: No mediastinal fluid or gas. Prior right thyroidectomy, normal left lobe thyroid gland and thoracic inlet. No acute abnormality of the trachea or esophagus. No worrisome mediastinal, hilar or axillary adenopathy. Lungs/Pleura: Layering right pleural effusion with additional trace left pleural fluid. Adjacent passive atelectasis. Dependent atelectasis bilaterally with additional areas of more bandlike subsegmental atelectasis or scarring. No focal airspace process. Central pulmonary vascular redistribution. Of a without other convincing features of frank edema. No pneumothorax. No concerning nodules or masses. Upper Abdomen: Slightly lobular hepatic surface contour with left lobe hypertrophy. Edematous changes in the upper abdomen small volume ascites. Musculoskeletal: Circumferential body wall edema. Multilevel degenerative  changes are present in the imaged portions of the spine. Multilevel flowing anterior osteophytosis, compatible with features of diffuse idiopathic skeletal hyperostosis (DISH). No acute osseous abnormality or suspicious osseous lesion. Review of the MIP images confirms the above findings. IMPRESSION: 1. Borderline opacification of the pulmonary arteries without large central, lobar or proximal segmental filling defects. 2. Features of elevated right heart pressure/right heart failure with right atrial ventricular enlargement, reflux of contrast into the hepatic veins and IVC and pulmonary venous redistribution. 3. Some developing anasarca/volume overload with bilateral effusions, right greater than left, body wall edema and upper abdominal mesenteric edema with ascites. Additional passive and dependent atelectatic changes with further subsegmental atelectasis or scarring. 4. Lobular liver surface contour with left lobe and caudate hypertrophy. Correlate for features of chronic hepatic congestion and/or cirrhosis. Electronically Signed   By: Price  DeHay M.D.   On: 07/01/2021 00:57   DG CHEST PORT 1 VIEW  Result Date: 07/01/2021 CLINICAL DATA:  Pulmonary edema.  Short of breath EXAM: PORTABLE CHEST 1 VIEW   COMPARISON:  06/29/2021 FINDINGS: Cardiac enlargement. Pulmonary vascular congestion. Diffuse bilateral airspace disease with slight progression. No effusion. IMPRESSION: Mild progression of bilateral airspace disease compatible with pulmonary edema. Electronically Signed   By: Charles  Clark M.D.   On: 07/01/2021 09:11     Medications:     Scheduled Medications:  amiodarone  200 mg Oral Daily   apixaban  5 mg Oral BID   aspirin  81 mg Oral Daily   calcium acetate  667 mg Oral TID WC   Chlorhexidine Gluconate Cloth  6 each Topical Daily   insulin aspart  0-15 Units Subcutaneous TID WC   insulin aspart  0-5 Units Subcutaneous QHS   mouth rinse  15 mL Mouth Rinse BID   midodrine  10 mg Oral TID WC     Infusions:  sodium chloride     sodium chloride     sodium chloride Stopped (06/30/21 0930)   phenylephrine ([name]-SYNEPHRINE) Adult infusion Stopped (07/01/21 0040)    PRN Medications: acetaminophen, diphenhydrAMINE, docusate sodium, lactulose, ondansetron (ZOFRAN) IV, oxyCODONE, polyethylene glycol    Assessment/Plan   1. Acute on chronic diastolic CHF with prominent RV failure: Echo as above with EF 60-65%, moderate LVH, mod-severe RV dysfunction with severe RVE, PASP 69, mod-severe TR, dilated IVC. This is very similar to prior echo from 2018 with RV looking mildly worse.  She is off norepinephrine, on midodrine. Volume controlled through iHD. - She will need ongoing dialysis for volume removal.   2. Pulmonary hypertension: RHC in 8/18 showed severe pulmonary hypertension with relatively low PVR (3.0 WU) and high cardiac output. This was thought to be high output PH primarily in setting of excessive flow through the large left arm AVF. She was not thought to be a candidate for selective pulmonary vasodilators.  PA systolic pressure by echo is similar this admission (though mildly lower) to the echo from 2018.  I suspect that the situation is the same, most likely primarily high output pulmonary hypertension and no role for selective pulmonary vasodilators.   - Fluid removal via HD as above. Support BP w/ midodrine  - Needs RHC with fistula occlusion to determine potential benefit of fistula ligation. This may help improve her RV failure.  3. Shock: Suspect primarily septic shock from cellulitis but RV failure likely plays a significant role as well. Resolved, off norepinephrine.  - Continue midodrine 10 mg tid. 4. ID: Cellulitis.  Completed abx course w/ daptomycin 5. Rhythm: Patient currently in sinus tach, has had episodes of what looks like AVNRT versus AVRT and possible atrial tachycardia. Afib noted 7/13 - Amio 200 mg bid - Eliquis 5 mg bid  - Currently off Toprol XL due to low  HR 6. Renal: ESRD.   - HD per nephrology   Plan RHC with fistula occlusion to determine potential benefit of fistula ligation. May have to consider tunneled HD access. Cath scheduled for 7/14.  Brittainy Simmons, PA-C  07/01/2021 10:23 AM  Patient seen with PA, agree with the above note.   Just had HD, BP stable on midodrine.  Off pressors.  Has had ongoing difficulty with hypotension with HD and there has been concern that this has been due to high output HF with severe pulmonary hypertension.   General: NAD Neck: JVP difficult, no thyromegaly or thyroid nodule.  Lungs: Clear to auscultation bilaterally with normal respiratory effort. CV: Nondisplaced PMI.  Heart tachy, regular S1/S2, no S3/S4, no murmur.  No peripheral edema.  Abdomen: Soft, nontender, no hepatosplenomegaly,   no distention.  Skin: Intact without lesions or rashes.  Neurologic: Alert and oriented x 3.  Psych: Normal affect. Extremities: No clubbing or cyanosis.  HEENT: Normal.   At this point, we need to consider treatment of the suspected high output pulmonary hypertension with RF failure by occlusion of the large AV fistula.  This is likely contributing significantly to her hypotension with HD.   We discussed RHC tomorrow. Will record [place] numbers before and after 5 minute occlusion of the AVF with BP cuff to see if there is significant change in PA pressure/right-sided pressures.  Will give heparin bolus with this.  I reviewed telemetry.  Still with episodes of SVT.  To me, this appears to be atrial tachycardia with block.  I have not seen any definite afib or flutter.  EP saw her in the past, I will ask EP to check telemetry also.  I do not think that there is a definite indication for anticoagulation.  - Review telemetry with EP, ?stop Eliquis (will hold pre-cath regardless).  - Continue po amiodarone.   Loralie Champagne 07/01/2021 1:57 PM

## 2021-07-01 NOTE — Progress Notes (Signed)
   NAME:  [name], MRN:  [id number], DOB:  08-07-1966, LOS: 71 ADMISSION DATE:  06/21/2021, CONSULTATION DATE:  06/21/21  REFERRING MD:  Melina Copa, ED, CHIEF COMPLAINT:  lethargy    History of Present Illness:  [name] is a 55 year old woman with hx of obesity, lymphedema, ESRD on HD MWF, CHF, recently hospitalized at [facility] for cellulitis, discharged 7/3, returning back same day with lethargy, hypotension and elevated lactic acid.  She tells me her family was worried about her after d/c because she was sleeping a lot, called to have her brought back to hospital.  No change in pain /tenderness to RLE.   In ED: LR bolus 1.8, doxycicline, cefepime, flagyl given.  She transferred out to the hospitalist service on 7/5.  PCCM called back to see patient for hypotension and tachycardia on 7/12.  Pertinent  Medical History  Anemia Thyroid cancer CHF Chiari malformation ESRD on HD/MWF COPD?  CAD HLD  HTN DM lymphedema  Significant Hospital Events: Including procedures, antibiotic start and stop dates in addition to other pertinent events   7/3 admitted to ICU on vasopressors, tachyarrhythmias overnight. Switched from NE> neosynephrine. On heparin. 7/4 Echo with acute on subacute R heart failure, LVH with no RWMA. Trop 3500.  7/5 transferred from ICU to hospitalist service 7/12 CTSP re: hypotension (normal lactate) and tachycardia  Interim History / Subjective:  Feels well today. Bps good. CTA no PE. Does have intermittent runs of Afib.  Objective   Blood pressure (!) 140/109, pulse (!) 133, temperature 97.8 F (36.6 C), temperature source Oral, resp. rate (!) 21, height _0  (1.651 m), weight (!) 139.3 kg, SpO2 96 %.        Intake/Output Summary (Last 24 hours) at 07/01/2021 0929 Last data filed at 07/01/2021 0800 Gross per 24 hour  Intake 1123.19 ml  Output --  Net 1123.19 ml    Filed Weights   06/29/21 1208 06/30/21 0500 07/01/21 0500  Weight: (!) 142.6 kg (!) 142.6 kg  (!) 139.3 kg    Examination: No distress Lungs clear Diffuse anasarca Mentating well Heart tachycardic, regular  Resolved Hospital Problem list     Assessment & Plan:  Complex RV failure in ESRD patient with OSA, obesity, and large fistula.  Seem to be reaching a plateau in getting fluid off due to BP issues despite midodrine and albumin.  Will discuss with nephrology and CHF team, may have to consider tunneled access and ligation of AVF.  I think PE has been definitely ruled out. - Reconsult CHF team to consider [place] with fistula occlusion to determine potential benefit of fistula ligation - To attempt HD today - Continue midodrine - Use phenylephrine vs. Levophed if run into trouble - Start PO amiodarone and continue heparin drip for her intermittent afib, suspect this will resolve if we can get her more dry   Best Practice (right click and "Reselect all SmartList Selections" daily)   Diet/type: Regular consistency (see orders) DVT prophylaxis: systemic heparin GI prophylaxis: PPI Lines: N/A Foley:  N/A   Code Status: Full Code   Patient critically ill due to shock, renal failure Interventions to address this today pressor titration Risk of deterioration without these interventions is high  I personally spent 34 minutes providing critical care not including any separately billable procedures  Erskine Emery MD [facility]  Prefer epic messenger for cross cover needs If after hours, please call E-link

## 2021-07-01 NOTE — TOC Benefit Eligibility Note (Signed)
Patient Teacher, English as a foreign language completed.    The patient is currently admitted and upon discharge could be taking Eliquis 5 mg.  The current 30 day co-pay is, $0.00.   The patient is insured through [employer] (Medicare Part D/[employer])    Lyndel Safe, CPhT Pharmacy Patient Advocate Specialist [facility] Antimicrobial Stewardship Team Direct Number: [phone]  Fax: [phone]

## 2021-07-01 NOTE — Progress Notes (Signed)
[place] Progress Note Patient Name: [name] DOB: 05-11-1966 MRN: [id number]   Date of Service  07/01/2021  HPI/Events of Note  CTA  chest negative for PE, patient needs SSI coverage to go along with CBG monitoring AC and HS, she also needs resumption of her diet which was on hold pending CTA.  eICU Interventions  Orders entered to resume diet, SSI coverage .        [name] [name] 07/01/2021, 3:12 AM

## 2021-07-01 NOTE — Progress Notes (Signed)
Physical Therapy Treatment Patient Details Name: [name] MRN: [id number] DOB: [birth year] Today's Date: 07/01/2021    History of Present Illness 55 y.o. female presenting from [facility] ED 7/3 with lethargy, hypotension and elevated lactic acid after d/c from [facility] earlier that day. Admitted at [facility] several days ago with cellulitis, given IV antibiotics and d/c'd home secondary to passing of father. This admission patient found to have AMS, septic shock, acute respiratory failure, acute on chronic RV failure. Transferred to ICU on 7/12 due to hypotension. PMHX significant for obesity, DMII, COPD, CAD, lymphedema, ESRD on HD MWF and CHF.    PT Comments    Pt tolerates treatment well with progression to transfer training. Pt continues to require significant physical assistance of multiple individuals to perform all functional mobility due to weakness. Pt demonstrates limited AROM of BLE during bed mobility at this time. Pt will benefit from continued acute PT POC and aggressive mobilization to improve functional mobility quality and to reduce caregiver burden. PT continues to recommend SNF placement at this time due to significant physical assistance needs for all mobility.  Follow Up Recommendations  SNF     Equipment Recommendations  Hospital bed (hoyer lift if D/C home)    Recommendations for Other Services       Precautions / Restrictions Precautions Precautions: Fall Precaution Comments: monitor HR Restrictions Weight Bearing Restrictions: No    Mobility  Bed Mobility Overal bed mobility: Needs Assistance Bed Mobility: Supine to Sit;Sit to Supine     Supine to sit: Max assist;HOB elevated;+2 for physical assistance (pt attempting to climb uphill to edge of bed due to bariatric air mattress) Sit to supine: Total assist (+3 assist)   General bed mobility comments: pt demonstrates limited AROM of LEs, able to assist more with UEs to pull up into sitting    Transfers Overall  transfer level: Needs assistance Equipment used: 2 person hand held assist Transfers: Sit to/from Stand Sit to Stand: Max assist;+2 physical assistance;From elevated surface         General transfer comment: floor mat placed under feet to allow feet to touch floor, bilateral knee block utilized for safety. Pt clears buttocks by 6-8 inches with 2 separate stand attempts, does not obtain upright trunk position  Ambulation/Gait                 Stairs             Wheelchair Mobility    Modified Rankin (Stroke Patients Only)       Balance Overall balance assessment: Needs assistance Sitting-balance support: Single extremity supported;Bilateral upper extremity supported;Feet supported Sitting balance-Leahy Scale: Poor Sitting balance - Comments: minA with UE support due to posterior lean Postural control: Posterior lean Standing balance support: Bilateral upper extremity supported Standing balance-Leahy Scale: Zero Standing balance comment: maxA x2                            Cognition Arousal/Alertness: Awake/alert Behavior During Therapy: WFL for tasks assessed/performed Overall Cognitive Status: Within Functional Limits for tasks assessed                                        Exercises      General Comments General comments (skin integrity, edema, etc.): pt on 2L [place] initially, weaned to RA with stable sats. HR at rest at  130, increases up to 135 with mobility. BP stable, pt reporting mild dizziness with initial sitting which subsides quickly      Pertinent Vitals/Pain Pain Assessment: Faces Faces Pain Scale: Hurts even more Pain Location: back, LEs Pain Descriptors / Indicators: Aching Pain Intervention(s): Monitored during session    Home Living                      Prior Function            PT Goals (current goals can now be found in the care plan section) Acute Rehab PT Goals Patient Stated Goal: To return  home with husband and granddaughter. Progress towards PT goals: Progressing toward goals    Frequency    Min 2X/week (highly motivated if able to see more frequently)      PT Plan Current plan remains appropriate    Co-evaluation PT/OT/SLP Co-Evaluation/Treatment: Yes Reason for Co-Treatment: For patient/therapist safety;To address functional/ADL transfers PT goals addressed during session: Mobility/safety with mobility;Balance;Strengthening/ROM        AM-PAC PT "6 Clicks" Mobility   Outcome Measure  Help needed turning from your back to your side while in a flat bed without using bedrails?: Total Help needed moving from lying on your back to sitting on the side of a flat bed without using bedrails?: Total Help needed moving to and from a bed to a chair (including a wheelchair)?: Total Help needed standing up from a chair using your arms (e.g., wheelchair or bedside chair)?: Total Help needed to walk in hospital room?: Total Help needed climbing 3-5 steps with a railing? : Total 6 Click Score: 6    End of Session Equipment Utilized During Treatment: Gait belt Activity Tolerance: Patient tolerated treatment well Patient left: in bed;with call bell/phone within reach Nurse Communication: Mobility status;Need for lift equipment PT Visit Diagnosis: Other abnormalities of gait and mobility (R26.89);Muscle weakness (generalized) (M62.81)     Time: 1030-1314 PT Time Calculation (min) (ACUTE ONLY): 26 min  Charges:  $Therapeutic Activity: 8-22 mins                     Zenaida Niece, PT, DPT Acute Rehabilitation Pager: [phone]    Zenaida Niece 07/01/2021, 9:33 AM

## 2021-07-01 NOTE — Progress Notes (Signed)
[facility] Progress Note    Assessment/ Plan:   # ESRD: Outpatient orders: [facility] , Monday Wednesday Friday, 255 minutes.  350 BFR/500 DFR  1K, 2.5 calcium, 138 sodium, EDW 135.5 kg, 15-gauge needles, machine temp 37 -Had UF 4L 7/11 but well over EDW - had planned for DUF 7/12 but held in light of hypotension  -Plan for usual HD today given BP ok and DUF tomorrow.   Acute on chronic RV failure with shock Sepsis, septic shock likely secondary to cellulitis -abx per ccm/primary (completed dapto) -possible that her AVF is a contributing factor, will need to have this looked as an outpatient unless she is not making any improvements then would need vascular surgery to evaluate her for possible ligation -CTA r/o'd PE -HF signed off inpt.  -on midodrine 10 TID   Volume/ hypertension: EDW 135.5kg. UF with HD today -    Anemia of Chronic Kidney Disease: Currently receiving epo 12k units and venofer 12m. Holding on IV iron   Secondary Hyperparathyroidism/Hyperphosphatemia: sensipar 60 qtx    DM2 with hyperglycemia: mgmt per primary service   # Additional recommendations: - Dose all meds for creatinine clearance < 10 ml/min  - Unless absolutely necessary, no MRIs with gadolinium.  - Implement save arm precautions.  Prefer needle sticks in the dorsum of the hands or wrists.  No blood pressure measurements in arm. - If blood transfusion is requested during hemodialysis sessions, please alert u[place]oreaprior to the session. - If a hemodialysis catheter line culture is requested, please alert u[place]as only hemodialysis nurses are able to collect those specimens.   LJannifer HickMD [facility] Pager [phone]  Subjective:   BP back to baseline now.  CTA chest r/o'd PE. Off heparin gtt now.  She feels ok currently.    Objective:   BP 114/69   Pulse (!) 131   Temp 97.8 F (36.6 C) (Oral)   Resp (!) 23   Ht _0  (1.651 m)   Wt (!) 139.3 kg   SpO2 94%    BMI 51.09 kg/m   Intake/Output Summary (Last 24 hours) at 07/01/2021 0751 Last data filed at 07/01/2021 0600 Gross per 24 hour  Intake 956.11 ml  Output --  Net 956.11 ml    Weight change: -7.346 kg  Physical Exam: Gen:nad, sitting up in bed CVS:s1s2, rrr Resp: normal wob AQPY:PPJKD soft, nt ETOI:ZTIWPYKDXIbilateral LE's w/ chronic skin changes Neuro: awake, alert, moves all ext spontaneously, speech clear and coherent HD access: lue avf +b/t   Imaging: CT Angio Chest Pulmonary Embolism (PE) W or WO Contrast  Result Date: 07/01/2021 CLINICAL DATA:  PE suspected EXAM: CT ANGIOGRAPHY CHEST WITH CONTRAST TECHNIQUE: Multidetector CT imaging of the chest was performed using the standard protocol during bolus administration of intravenous contrast. Multiplanar CT image reconstructions and MIPs were obtained to evaluate the vascular anatomy. CONTRAST:  759mOMNIPAQUE IOHEXOL 350 MG/ML SOLN COMPARISON:  Radiograph 05/30/2021 FINDINGS: Cardiovascular: Borderline opacification of the pulmonary arteries. No central, lobar or segmental filling defects are demonstrated. Central pulmonary arteries are enlarged. There is also notable enlargement of the right atrium and ventricle on a background of diffuse cardiomegaly. Reflux of contrast into the hepatic veins and IVC. Coronary artery calcifications. Atherosclerotic plaque within the normal caliber aorta. No acute luminal abnormality of the imaged aorta. No periaortic stranding or hemorrhage. Normal 3 vessel branching of the aortic arch. Proximal great vessels are unremarkable. Mediastinum/Nodes: No mediastinal fluid or gas. Prior right  thyroidectomy, normal left lobe thyroid gland and thoracic inlet. No acute abnormality of the trachea or esophagus. No worrisome mediastinal, hilar or axillary adenopathy. Lungs/Pleura: Layering right pleural effusion with additional trace left pleural fluid. Adjacent passive atelectasis. Dependent atelectasis bilaterally  with additional areas of more bandlike subsegmental atelectasis or scarring. No focal airspace process. Central pulmonary vascular redistribution. Of a without other convincing features of frank edema. No pneumothorax. No concerning nodules or masses. Upper Abdomen: Slightly lobular hepatic surface contour with left lobe hypertrophy. Edematous changes in the upper abdomen small volume ascites. Musculoskeletal: Circumferential body wall edema. Multilevel degenerative changes are present in the imaged portions of the spine. Multilevel flowing anterior osteophytosis, compatible with features of diffuse idiopathic skeletal hyperostosis (DISH). No acute osseous abnormality or suspicious osseous lesion. Review of the MIP images confirms the above findings. IMPRESSION: 1. Borderline opacification of the pulmonary arteries without large central, lobar or proximal segmental filling defects. 2. Features of elevated right heart pressure/right heart failure with right atrial ventricular enlargement, reflux of contrast into the hepatic veins and IVC and pulmonary venous redistribution. 3. Some developing anasarca/volume overload with bilateral effusions, right greater than left, body wall edema and upper abdominal mesenteric edema with ascites. Additional passive and dependent atelectatic changes with further subsegmental atelectasis or scarring. 4. Lobular liver surface contour with left lobe and caudate hypertrophy. Correlate for features of chronic hepatic congestion and/or cirrhosis. Electronically Signed   By: Lovena Le M.D.   On: 07/01/2021 00:57   DG Chest Port 1 View  Result Date: 06/29/2021 CLINICAL DATA:  Chest pain. EXAM: PORTABLE CHEST 1 VIEW COMPARISON:  06/23/2021 FINDINGS: Mild enlargement of the cardiopericardial silhouette. Atherosclerotic calcification of the aortic arch. Indistinct pulmonary vasculature suggesting pulmonary venous hypertension. No overt edema. Linear subsegmental atelectasis at the  right lung base. No blunting of the lateral costophrenic angles. Absent distal right clavicle. IMPRESSION: 1. Mild enlargement of the cardiopericardial silhouette with suspected pulmonary venous hypertension but no overt edema. No blunting of the costophrenic angles. 2.  Aortic Atherosclerosis (ICD10-I70.0). Electronically Signed   By: Van Clines M.D.   On: 06/29/2021 20:57    Labs: BMET Recent Labs  Lab 06/24/21 1427 06/27/21 0140 06/29/21 0628 06/30/21 0609 06/30/21 0825  NA 132* 133* 130* 134* 134*  K 3.8 4.0 4.3 3.9 3.9  CL 96* 99 93* 97* 96*  CO2 _0 GLUCOSE 173* 141* 102* 107* 95  BUN 42* 38* 30* 17 18  CREATININE 7.51* 7.81* 6.75* 5.18* 5.36*  CALCIUM 7.5* 8.1* 8.4* 8.5* 8.5*  PHOS 6.0* 5.1* 5.3*  --   --     CBC Recent Labs  Lab 06/28/21 0241 06/29/21 0628 06/30/21 0609 07/01/21 0550  WBC 8.5 7.0 6.3 5.1  HGB 9.6* 9.8* 9.5* 10.7*  HCT 30.3* 30.8* 30.9* 33.1*  MCV 98.7 99.0 100.0 98.8  PLT 180 197 201 202     Medications:     aspirin  81 mg Oral Daily   calcium acetate  667 mg Oral TID WC   Chlorhexidine Gluconate Cloth  6 each Topical Daily   heparin injection (subcutaneous)  5,000 Units Subcutaneous Q8H   insulin aspart  1-3 Units Subcutaneous Q4H   mouth rinse  15 mL Mouth Rinse BID   midodrine  10 mg Oral TID WC    Jannifer Hick MD [facility] Pager [phone]

## 2021-07-01 NOTE — Discharge Instructions (Signed)

## 2021-07-01 NOTE — Progress Notes (Addendum)
Patient ID: [name], female   DOB: 1966-10-31, 55 y.o.   MRN: [id number]     Advanced Heart Failure Rounding Note  PCP-Cardiologist: Dr. Ross/ Dr. Aundra Dubin    Patient Profile:    55 y/o female w/ chronic diastolic CHF with prominent RV failure, pulmonary HTN, ESRD on HD admitted w/ septic shock 2/2 LE cellulitis. Septic shock resolved. Now off NE. Completed abx.   Pt seen in consultation by Dr. Aundra Dubin earlier this admit (see notes for full details). AHF team asked to revisit given issues w/ hypotension limiting HD. Was transferred back to ICU 7/12 for profound hypotension and tachycardia w/ HD and started on phenylephrine. PE has been ruled out. PAF captured on tele. Thought to be high output PH primarily in setting of excessive flow through the large left arm AVF. ?Need for fistula ligation. We are asked to assist w/ RHC w/ fistual occlusion to help better assess.   Subjective:    Now off [name]. Currently getting iHD at bedside. SBP 99. Received midodrine prior session. Sinus tach 130s on tele. Has had intermittent Afib. Now on PO amio and Eliquis. Feels tired. No other complaints.   Objective:   Weight Range: (!) 139.3 kg Body mass index is 51.09 kg/m.   Vital Signs:   Temp:  [97.7 F (36.5 C)-98.9 F (37.2 C)] 97.8 F (36.6 C) (07/13 0738) Pulse Rate:  [53-133] 131 (07/13 1015) Resp:  [15-35] 22 (07/13 1015) BP: (82-140)/(56-109) 85/68 (07/13 1015) SpO2:  [58 %-100 %] 95 % (07/13 1015) Weight:  [139.3 kg] 139.3 kg (07/13 0500) Last BM Date: 07/01/21  Weight change: Filed Weights   06/29/21 1208 06/30/21 0500 07/01/21 0500  Weight: (!) 142.6 kg (!) 142.6 kg (!) 139.3 kg    Intake/Output:   Intake/Output Summary (Last 24 hours) at 07/01/2021 1023 Last data filed at 07/01/2021 0800 Gross per 24 hour  Intake 1095.2 ml  Output --  Net 1095.2 ml      Physical Exam    General:  Well appearing, morbidly obese getting iHD at bedside. No respiratory difficulty HEENT:  normal Neck: supple. JVD elevated to ear. Carotids 2+ bilat; no bruits. No lymphadenopathy or thyromegaly appreciated. Cor: PMI nondisplaced. Regular rhythm tachy rate No rubs, gallops or murmurs. Lungs: clear Abdomen: soft, nontender, nondistended. No hepatosplenomegaly. No bruits or masses. Good bowel sounds. Extremities: no cyanosis, clubbing, rash, 1+ bilateral LE edema +RUE Fistula  Neuro: alert & oriented x 3, cranial nerves grossly intact. moves all 4 extremities w/o difficulty. Affect pleasant.  Telemetry   Sinus tach 130s, some runs of PAF   EKG    N/A  Labs    CBC Recent Labs    06/30/21 0609 07/01/21 0550  WBC 6.3 5.1  HGB 9.5* 10.7*  HCT 30.9* 33.1*  MCV 100.0 98.8  PLT 201 329   Basic Metabolic Panel Recent Labs    06/29/21 0628 06/30/21 0609 06/30/21 0825 07/01/21 0550  NA 130*   < > 134* 133*  K 4.3   < > 3.9 5.0  CL 93*   < > 96* 97*  CO2 25   < > 26 24  GLUCOSE 102*   < > 95 207*  BUN 30*   < > 18 28*  CREATININE 6.75*   < > 5.36* 6.44*  CALCIUM 8.4*   < > 8.5* 7.9*  MG  --   --  2.0  --   PHOS 5.3*  --   --   --    < > =  values in this interval not displayed.   Liver Function Tests Recent Labs    06/29/21 0628  ALBUMIN 2.8*   No results for input(s): LIPASE, AMYLASE in the last 72 hours. Cardiac Enzymes No results for input(s): CKTOTAL, CKMB, CKMBINDEX, TROPONINI in the last 72 hours.   BNP: BNP (last 3 results) Recent Labs    06/22/21 1400 06/29/21 1534 07/01/21 0550  BNP 590.8* 718.9* 2,062.9*    ProBNP (last 3 results) No results for input(s): PROBNP in the last 8760 hours.   D-Dimer No results for input(s): DDIMER in the last 72 hours.  Hemoglobin A1C No results for input(s): HGBA1C in the last 72 hours.  Fasting Lipid Panel No results for input(s): CHOL, HDL, LDLCALC, TRIG, CHOLHDL, LDLDIRECT in the last 72 hours. Thyroid Function Tests Recent Labs    06/30/21 0609  TSH 4.633*    Other results:   Imaging     CT Angio Chest Pulmonary Embolism (PE) W or WO Contrast  Result Date: 07/01/2021 CLINICAL DATA:  PE suspected EXAM: CT ANGIOGRAPHY CHEST WITH CONTRAST TECHNIQUE: Multidetector CT imaging of the chest was performed using the standard protocol during bolus administration of intravenous contrast. Multiplanar CT image reconstructions and MIPs were obtained to evaluate the vascular anatomy. CONTRAST:  82m OMNIPAQUE IOHEXOL 350 MG/ML SOLN COMPARISON:  Radiograph 05/30/2021 FINDINGS: Cardiovascular: Borderline opacification of the pulmonary arteries. No central, lobar or segmental filling defects are demonstrated. Central pulmonary arteries are enlarged. There is also notable enlargement of the right atrium and ventricle on a background of diffuse cardiomegaly. Reflux of contrast into the hepatic veins and IVC. Coronary artery calcifications. Atherosclerotic plaque within the normal caliber aorta. No acute luminal abnormality of the imaged aorta. No periaortic stranding or hemorrhage. Normal 3 vessel branching of the aortic arch. Proximal great vessels are unremarkable. Mediastinum/Nodes: No mediastinal fluid or gas. Prior right thyroidectomy, normal left lobe thyroid gland and thoracic inlet. No acute abnormality of the trachea or esophagus. No worrisome mediastinal, hilar or axillary adenopathy. Lungs/Pleura: Layering right pleural effusion with additional trace left pleural fluid. Adjacent passive atelectasis. Dependent atelectasis bilaterally with additional areas of more bandlike subsegmental atelectasis or scarring. No focal airspace process. Central pulmonary vascular redistribution. Of a without other convincing features of frank edema. No pneumothorax. No concerning nodules or masses. Upper Abdomen: Slightly lobular hepatic surface contour with left lobe hypertrophy. Edematous changes in the upper abdomen small volume ascites. Musculoskeletal: Circumferential body wall edema. Multilevel degenerative  changes are present in the imaged portions of the spine. Multilevel flowing anterior osteophytosis, compatible with features of diffuse idiopathic skeletal hyperostosis (DISH). No acute osseous abnormality or suspicious osseous lesion. Review of the MIP images confirms the above findings. IMPRESSION: 1. Borderline opacification of the pulmonary arteries without large central, lobar or proximal segmental filling defects. 2. Features of elevated right heart pressure/right heart failure with right atrial ventricular enlargement, reflux of contrast into the hepatic veins and IVC and pulmonary venous redistribution. 3. Some developing anasarca/volume overload with bilateral effusions, right greater than left, body wall edema and upper abdominal mesenteric edema with ascites. Additional passive and dependent atelectatic changes with further subsegmental atelectasis or scarring. 4. Lobular liver surface contour with left lobe and caudate hypertrophy. Correlate for features of chronic hepatic congestion and/or cirrhosis. Electronically Signed   By: PLovena LeM.D.   On: 07/01/2021 00:57   DG CHEST PORT 1 VIEW  Result Date: 07/01/2021 CLINICAL DATA:  Pulmonary edema.  Short of breath EXAM: PORTABLE CHEST 1 VIEW  COMPARISON:  06/29/2021 FINDINGS: Cardiac enlargement. Pulmonary vascular congestion. Diffuse bilateral airspace disease with slight progression. No effusion. IMPRESSION: Mild progression of bilateral airspace disease compatible with pulmonary edema. Electronically Signed   By: Franchot Gallo M.D.   On: 07/01/2021 09:11     Medications:     Scheduled Medications:  amiodarone  200 mg Oral Daily   apixaban  5 mg Oral BID   aspirin  81 mg Oral Daily   calcium acetate  667 mg Oral TID WC   Chlorhexidine Gluconate Cloth  6 each Topical Daily   insulin aspart  0-15 Units Subcutaneous TID WC   insulin aspart  0-5 Units Subcutaneous QHS   mouth rinse  15 mL Mouth Rinse BID   midodrine  10 mg Oral TID WC     Infusions:  sodium chloride     sodium chloride     sodium chloride Stopped (06/30/21 0930)   phenylephrine ([name]-SYNEPHRINE) Adult infusion Stopped (07/01/21 0040)    PRN Medications: acetaminophen, diphenhydrAMINE, docusate sodium, lactulose, ondansetron (ZOFRAN) IV, oxyCODONE, polyethylene glycol    Assessment/Plan   1. Acute on chronic diastolic CHF with prominent RV failure: Echo as above with EF 60-65%, moderate LVH, mod-severe RV dysfunction with severe RVE, PASP 69, mod-severe TR, dilated IVC. This is very similar to prior echo from 2018 with RV looking mildly worse.  She is off norepinephrine, on midodrine. Volume controlled through iHD. - She will need ongoing dialysis for volume removal.   2. Pulmonary hypertension: RHC in 8/18 showed severe pulmonary hypertension with relatively low PVR (3.0 WU) and high cardiac output. This was thought to be high output PH primarily in setting of excessive flow through the large left arm AVF. She was not thought to be a candidate for selective pulmonary vasodilators.  PA systolic pressure by echo is similar this admission (though mildly lower) to the echo from 2018.  I suspect that the situation is the same, most likely primarily high output pulmonary hypertension and no role for selective pulmonary vasodilators.   - Fluid removal via HD as above. Support BP w/ midodrine  - Needs RHC with fistula occlusion to determine potential benefit of fistula ligation. This may help improve her RV failure.  3. Shock: Suspect primarily septic shock from cellulitis but RV failure likely plays a significant role as well. Resolved, off norepinephrine.  - Continue midodrine 10 mg tid. 4. ID: Cellulitis.  Completed abx course w/ daptomycin 5. Rhythm: Patient currently in sinus tach, has had episodes of what looks like AVNRT versus AVRT and possible atrial tachycardia. Afib noted 7/13 - Amio 200 mg bid - Eliquis 5 mg bid  - Currently off Toprol XL due to low  HR 6. Renal: ESRD.   - HD per nephrology   Plan RHC with fistula occlusion to determine potential benefit of fistula ligation. May have to consider tunneled HD access. Cath scheduled for 7/14.  Lyda Jester, PA-C  07/01/2021 10:23 AM  Patient seen with PA, agree with the above note.   Just had HD, BP stable on midodrine.  Off pressors.  Has had ongoing difficulty with hypotension with HD and there has been concern that this has been due to high output HF with severe pulmonary hypertension.   General: NAD Neck: JVP difficult, no thyromegaly or thyroid nodule.  Lungs: Clear to auscultation bilaterally with normal respiratory effort. CV: Nondisplaced PMI.  Heart tachy, regular S1/S2, no S3/S4, no murmur.  No peripheral edema.  Abdomen: Soft, nontender, no hepatosplenomegaly,  no distention.  Skin: Intact without lesions or rashes.  Neurologic: Alert and oriented x 3.  Psych: Normal affect. Extremities: No clubbing or cyanosis.  HEENT: Normal.   At this point, we need to consider treatment of the suspected high output pulmonary hypertension with RF failure by occlusion of the large AV fistula.  This is likely contributing significantly to her hypotension with HD.   We discussed RHC tomorrow. Will record [place]y numbers before and after 5 minute occlusion of the AVF with BP cuff to see if there is significant change in PA pressure/right-sided pressures.  Will give heparin bolus with this.  I reviewed telemetry.  Still with episodes of SVT.  To me, this appears to be atrial tachycardia with block.  I have not seen any definite afib or flutter.  EP saw her in the past, I will ask EP to check telemetry also.  I do not think that there is a definite indication for anticoagulation.  - Review telemetry with EP, ?stop Eliquis (will hold pre-cath regardless).  - Continue po amiodarone.   Loralie Champagne 07/01/2021 1:57 PM

## 2021-07-02 ENCOUNTER — Encounter (HOSPITAL_COMMUNITY)
Admission: EM | Disposition: A | Discharge status: Home Health | Payer: Self-pay | Source: Home / Self Care | Attending: Internal Medicine

## 2021-07-02 ENCOUNTER — Encounter (HOSPITAL_COMMUNITY): Payer: Self-pay | Admitting: Cardiology

## 2021-07-02 DIAGNOSIS — E861 Hypovolemia: Secondary | ICD-10-CM

## 2021-07-02 DIAGNOSIS — I272 Pulmonary hypertension, unspecified: Secondary | ICD-10-CM

## 2021-07-02 DIAGNOSIS — I50810 Right heart failure, unspecified: Secondary | ICD-10-CM | POA: Diagnosis not present

## 2021-07-02 DIAGNOSIS — I9589 Other hypotension: Secondary | ICD-10-CM | POA: Diagnosis not present

## 2021-07-02 DIAGNOSIS — R570 Cardiogenic shock: Secondary | ICD-10-CM | POA: Diagnosis not present

## 2021-07-02 HISTORY — PX: RIGHT HEART CATH: CATH118263

## 2021-07-02 LAB — POCT I-STAT EG7
Acid-Base Excess: 1 mmol/L (ref 0.0–2.0)
Acid-Base Excess: 2 mmol/L (ref 0.0–2.0)
Acid-Base Excess: 2 mmol/L (ref 0.0–2.0)
Bicarbonate: 28.1 mmol/L — ABNORMAL HIGH (ref 20.0–28.0)
Bicarbonate: 28.8 mmol/L — ABNORMAL HIGH (ref 20.0–28.0)
Bicarbonate: 29.4 mmol/L — ABNORMAL HIGH (ref 20.0–28.0)
Calcium, Ion: 1.1 mmol/L — ABNORMAL LOW (ref 1.15–1.40)
Calcium, Ion: 1.11 mmol/L — ABNORMAL LOW (ref 1.15–1.40)
Calcium, Ion: 1.11 mmol/L — ABNORMAL LOW (ref 1.15–1.40)
HCT: 34 % — ABNORMAL LOW (ref 36.0–46.0)
HCT: 35 % — ABNORMAL LOW (ref 36.0–46.0)
HCT: 35 % — ABNORMAL LOW (ref 36.0–46.0)
Hemoglobin: 11.6 g/dL — ABNORMAL LOW (ref 12.0–15.0)
Hemoglobin: 11.9 g/dL — ABNORMAL LOW (ref 12.0–15.0)
Hemoglobin: 11.9 g/dL — ABNORMAL LOW (ref 12.0–15.0)
O2 Saturation: 53 %
O2 Saturation: 60 %
O2 Saturation: 64 %
Patient temperature: 98
Patient temperature: 98
Potassium: 4 mmol/L (ref 3.5–5.1)
Potassium: 4.2 mmol/L (ref 3.5–5.1)
Potassium: 4.3 mmol/L (ref 3.5–5.1)
Sodium: 135 mmol/L (ref 135–145)
Sodium: 135 mmol/L (ref 135–145)
Sodium: 135 mmol/L (ref 135–145)
TCO2: 30 mmol/L (ref 22–32)
TCO2: 31 mmol/L (ref 22–32)
TCO2: 31 mmol/L (ref 22–32)
pCO2, Ven: 52.5 mmHg (ref 44.0–60.0)
pCO2, Ven: 56.1 mmHg (ref 44.0–60.0)
pCO2, Ven: 56.6 mmHg (ref 44.0–60.0)
pH, Ven: 7.315 (ref 7.250–7.430)
pH, Ven: 7.325 (ref 7.250–7.430)
pH, Ven: 7.335 (ref 7.250–7.430)
pO2, Ven: 31 mmHg — CL (ref 32.0–45.0)
pO2, Ven: 34 mmHg (ref 32.0–45.0)
pO2, Ven: 35 mmHg (ref 32.0–45.0)

## 2021-07-02 LAB — BASIC METABOLIC PANEL
Anion gap: 13 (ref 5–15)
BUN: 27 mg/dL — ABNORMAL HIGH (ref 6–20)
CO2: 24 mmol/L (ref 22–32)
Calcium: 8.2 mg/dL — ABNORMAL LOW (ref 8.9–10.3)
Chloride: 96 mmol/L — ABNORMAL LOW (ref 98–111)
Creatinine, Ser: 5.26 mg/dL — ABNORMAL HIGH (ref 0.44–1.00)
GFR, Estimated: 9 mL/min — ABNORMAL LOW (ref >60)
Glucose, Bld: 197 mg/dL — ABNORMAL HIGH (ref 70–99)
Potassium: 5.3 mmol/L — ABNORMAL HIGH (ref 3.5–5.1)
Sodium: 133 mmol/L — ABNORMAL LOW (ref 135–145)

## 2021-07-02 LAB — GLUCOSE, CAPILLARY
Glucose-Capillary: 174 mg/dL — ABNORMAL HIGH (ref 70–99)
Glucose-Capillary: 171 mg/dL — ABNORMAL HIGH (ref 70–99)
Glucose-Capillary: 215 mg/dL — ABNORMAL HIGH (ref 70–99)
Glucose-Capillary: 107 mg/dL — ABNORMAL HIGH (ref 70–99)
Glucose-Capillary: 109 mg/dL — ABNORMAL HIGH (ref 70–99)

## 2021-07-02 LAB — POCT ACTIVATED CLOTTING TIME: Activated Clotting Time: 173 seconds

## 2021-07-02 SURGERY — RIGHT HEART CATH
Anesthesia: LOCAL

## 2021-07-02 MED ORDER — LIDOCAINE HCL (PF) 1 % IJ SOLN
INTRAMUSCULAR | Status: AC
  Filled 2021-07-02: qty 30

## 2021-07-02 MED ORDER — SODIUM CHLORIDE 0.9% FLUSH: 3.0000 mL | INTRAVENOUS | Status: DC | PRN

## 2021-07-02 MED ORDER — HEPARIN SODIUM (PORCINE) 1000 UNIT/ML IJ SOLN
INTRAMUSCULAR | Status: AC
  Filled 2021-07-02: qty 1

## 2021-07-02 MED ORDER — HEPARIN (PORCINE) IN NACL 1000-0.9 UT/500ML-% IV SOLN
INTRAVENOUS | Status: DC | PRN
  Administered 2021-07-02 (×2): 500 mL

## 2021-07-02 MED ORDER — FENTANYL CITRATE (PF) 100 MCG/2ML IJ SOLN
25.0000 ug | INTRAMUSCULAR | Status: DC | PRN
Start: 2021-07-02 — End: 2021-07-09
  Administered 2021-07-02 – 2021-07-08 (×10): 25 ug via INTRAVENOUS
  Filled 2021-07-02 (×10): qty 2

## 2021-07-02 MED ORDER — MIDAZOLAM HCL 2 MG/2ML IJ SOLN
INTRAMUSCULAR | Status: DC | PRN
  Administered 2021-07-02: 1 mg via INTRAVENOUS

## 2021-07-02 MED ORDER — MIDODRINE HCL 5 MG PO TABS
15.0000 mg | ORAL_TABLET | Freq: Three times a day (TID) | ORAL | Status: DC
  Administered 2021-07-02 – 2021-08-24 (×144): 15 mg via ORAL
  Filled 2021-07-02 (×143): qty 3

## 2021-07-02 MED ORDER — SODIUM ZIRCONIUM CYCLOSILICATE 10 G PO PACK
10.0000 g | PACK | Freq: Once | ORAL | Status: AC
  Administered 2021-07-02: 10 g via ORAL
  Filled 2021-07-02: qty 1

## 2021-07-02 MED ORDER — ONDANSETRON HCL 4 MG/2ML IJ SOLN
INTRAMUSCULAR | Status: AC
  Filled 2021-07-02: qty 2

## 2021-07-02 MED ORDER — ASPIRIN 81 MG PO CHEW: 81.0000 mg | CHEWABLE_TABLET | ORAL | Status: AC

## 2021-07-02 MED ORDER — LIDOCAINE HCL (PF) 1 % IJ SOLN
INTRAMUSCULAR | Status: DC | PRN
  Administered 2021-07-02: 10 mL

## 2021-07-02 MED ORDER — FENTANYL CITRATE (PF) 100 MCG/2ML IJ SOLN: INTRAMUSCULAR | Status: DC | PRN

## 2021-07-02 MED ORDER — SODIUM CHLORIDE 0.9 % IV SOLN: 250.0000 mL | INTRAVENOUS | Status: DC | PRN

## 2021-07-02 MED ORDER — AMIODARONE HCL IN DEXTROSE 360-4.14 MG/200ML-% IV SOLN
60.0000 mg/h | INTRAVENOUS | Status: AC
  Administered 2021-07-02 (×2): 60 mg/h via INTRAVENOUS
  Filled 2021-07-02: qty 200

## 2021-07-02 MED ORDER — SILDENAFIL CITRATE 20 MG PO TABS
20.0000 mg | ORAL_TABLET | Freq: Three times a day (TID) | ORAL | Status: DC
  Administered 2021-07-02 – 2021-07-04 (×5): 20 mg via ORAL
  Filled 2021-07-02 (×5): qty 1

## 2021-07-02 MED ORDER — HEPARIN SODIUM (PORCINE) 1000 UNIT/ML IJ SOLN
INTRAMUSCULAR | Status: DC | PRN
  Administered 2021-07-02: 5000 [IU] via INTRAVENOUS

## 2021-07-02 MED ORDER — AMIODARONE HCL IN DEXTROSE 360-4.14 MG/200ML-% IV SOLN
30.0000 mg/h | INTRAVENOUS | Status: DC
  Administered 2021-07-02 – 2021-07-05 (×6): 30 mg/h via INTRAVENOUS
  Filled 2021-07-02 (×6): qty 200

## 2021-07-02 MED ORDER — ONDANSETRON HCL 4 MG/2ML IJ SOLN
INTRAMUSCULAR | Status: DC | PRN
  Administered 2021-07-02: 4 mg via INTRAVENOUS

## 2021-07-02 MED ORDER — AMIODARONE HCL IN DEXTROSE 360-4.14 MG/200ML-% IV SOLN
INTRAVENOUS | Status: AC
  Filled 2021-07-02: qty 200

## 2021-07-02 MED ORDER — MIDAZOLAM HCL 2 MG/2ML IJ SOLN
INTRAMUSCULAR | Status: AC
  Filled 2021-07-02: qty 2

## 2021-07-02 MED ORDER — SODIUM CHLORIDE 0.9 % IV SOLN
12.5000 mg | Freq: Four times a day (QID) | INTRAVENOUS | Status: DC | PRN
  Administered 2021-07-02 – 2021-08-08 (×2): 12.5 mg via INTRAVENOUS
  Filled 2021-07-02 (×3): qty 0.5

## 2021-07-02 MED ORDER — SODIUM CHLORIDE 0.9 % IV SOLN: INTRAVENOUS | Status: DC

## 2021-07-02 MED ORDER — HEPARIN (PORCINE) 25000 UT/250ML-% IV SOLN
2000.0000 [IU]/h | INTRAVENOUS | Status: DC
  Administered 2021-07-02: 1600 [IU]/h via INTRAVENOUS
  Administered 2021-07-03: 1700 [IU]/h via INTRAVENOUS
  Administered 2021-07-04: 1800 [IU]/h via INTRAVENOUS
  Administered 2021-07-04: 1700 [IU]/h via INTRAVENOUS
  Administered 2021-07-05: 2000 [IU]/h via INTRAVENOUS
  Filled 2021-07-02 (×6): qty 250

## 2021-07-02 MED ORDER — AMIODARONE LOAD VIA INFUSION
150.0000 mg | Freq: Once | INTRAVENOUS | Status: AC
  Administered 2021-07-02: 150 mg via INTRAVENOUS
  Filled 2021-07-02: qty 83.34

## 2021-07-02 SURGICAL SUPPLY — 11 items
CATH SWAN GANZ 7F STRAIGHT (CATHETERS) ×2 IMPLANT
KIT HEART LEFT (KITS) ×2 IMPLANT
PACK CARDIAC CATHETERIZATION (CUSTOM PROCEDURE TRAY) ×2 IMPLANT
PINNACLE LONG 7F 25CM (SHEATH) ×2
PROTECTION STATION PRESSURIZED (MISCELLANEOUS) ×2
SHEATH INTRO PINNACLE 7F 25CM (SHEATH) ×1 IMPLANT
SHEATH PROBE COVER 6X72 (BAG) ×2 IMPLANT
STATION PROTECTION PRESSURIZED (MISCELLANEOUS) ×1 IMPLANT
TRANSDUCER W/STOPCOCK (MISCELLANEOUS) ×2 IMPLANT
WIRE EMERALD 3MM-J .025X260CM (WIRE) ×2 IMPLANT
WIRE EMERALD 3MM-J .035X150CM (WIRE) ×2 IMPLANT

## 2021-07-02 NOTE — Interval H&P Note (Signed)
History and Physical Interval Note:  07/02/2021 12:46 PM  [name]  has presented today for surgery, with the diagnosis of hp - heart failure.  The various methods of treatment have been discussed with the patient and family. After consideration of risks, benefits and other options for treatment, the patient has consented to  Procedure(s): RIGHT HEART CATH (N/A) as a surgical intervention.  The patient's history has been reviewed, patient examined, no change in status, stable for surgery.  I have reviewed the patient's chart and labs.  Questions were answered to the patient's satisfaction.     Woodson Macha [employer]

## 2021-07-02 NOTE — Progress Notes (Signed)
NAME:  [name], MRN:  [id number], DOB:  1966-06-12, LOS: 29 ADMISSION DATE:  06/21/2021, CONSULTATION DATE:  06/21/21  REFERRING MD:  Melina Copa, ED, CHIEF COMPLAINT:  lethargy    History of Present Illness:  Ms Flatley is a 55 year old woman with hx of obesity, lymphedema, ESRD on HD MWF, CHF, recently hospitalized at [facility] for cellulitis, discharged 7/3, returning back same day with lethargy, hypotension and elevated lactic acid.  She tells me her family was worried about her after d/c because she was sleeping a lot, called to have her brought back to hospital.  No change in pain /tenderness to RLE.   In ED: LR bolus 1.8, doxycicline, cefepime, flagyl given.  She transferred out to the hospitalist service on 7/5.  PCCM called back to see patient for hypotension and tachycardia on 7/12.  Pertinent  Medical History  Anemia Thyroid cancer CHF Chiari malformation ESRD on HD/MWF COPD?  CAD HLD  HTN DM lymphedema  Significant Hospital Events: Including procedures, antibiotic start and stop dates in addition to other pertinent events   7/3 admitted to ICU on vasopressors, tachyarrhythmias overnight. Switched from NE> neosynephrine. On heparin. 7/4 Echo with acute on subacute R heart failure, LVH with no RWMA. Trop 3500.  7/5 transferred from ICU to hospitalist service 7/12 CTSP re: hypotension (normal lactate) and tachycardia  Interim History / Subjective:  Bps drifted back down overnight. Remains in sinus tachycardia with intermittent feelings of palpitations: thought to be atypical aflutter by EP. Heparin gtt off for potential cath.  Objective   Blood pressure 93/63, pulse (!) 130, temperature 99 F (37.2 C), temperature source Axillary, resp. rate (!) 32, height _0  (1.651 m), weight 136 kg, SpO2 93 %.        Intake/Output Summary (Last 24 hours) at 07/02/2021 0728 Last data filed at 07/01/2021 2200 Gross per 24 hour  Intake 911.65 ml  Output 3800 ml  Net -2888.35 ml     Filed Weights   07/01/21 0500 07/01/21 0938 07/01/21 1353  Weight: (!) 139.3 kg (!) 140 kg 136 kg    Examination: No distress Leg edema improved after HD yesterday Heart sounds tachycardic, regular, ext warm Left forearm fistula in place Mentating well, good insight  K slightly up No CBC  Resolved Hospital Problem list     Assessment & Plan:  Complex RV failure in ESRD patient with OSA, obesity, and large fistula.  Seem to be reaching a plateau in getting fluid off due to BP issues despite midodrine and albumin.  Only option here seems to be inpatient fistula ligation and tunneled access presuming this is high output.  If she cannot tolerate HD after this she would be at end of life. Intermittent Atypical aflutter - RHC with fistula occlusion at noon - Will reach out to [facility] to put her on their radar for tunneled catheter insertion and fistula occlusion - Continue midodrine - Use phenylephrine vs. Levophed PRN, going to try Bps in different ext to see what is accurate - Resume heparin once RHC complete, will need NoAC once all operative plans are completed  - iHD with fluid removal as tolerated  Best Practice (right click and "Reselect all SmartList Selections" daily)   Diet/type: Regular consistency (see orders) DVT prophylaxis: systemic heparin GI prophylaxis: PPI Lines: N/A Foley:  N/A   Code Status: Full Code   Patient critically ill due to shock, renal failure Interventions to address this today pressor titration Risk of deterioration without these  interventions is high  I personally spent 32 minutes providing critical care not including any separately billable procedures  Erskine Emery MD [facility]  Prefer epic messenger for cross cover needs If after hours, please call E-link

## 2021-07-02 NOTE — Progress Notes (Addendum)
Patient ID: [name], female   DOB: 1966/07/07, 55 y.o.   MRN: [id number]     Advanced Heart Failure Rounding Note  PCP-Cardiologist: Dr. Ross/ Dr. Aundra Dubin    Patient Profile:    55 y/o female w/ chronic diastolic CHF with prominent RV failure, pulmonary HTN, ESRD on HD admitted w/ septic shock 2/2 LE cellulitis. Septic shock resolved. Now off NE. Completed abx.   Pt seen in consultation by Dr. Aundra Dubin earlier this admit (see notes for full details). AHF team asked to revisit given issues w/ hypotension limiting HD. Was transferred back to ICU 7/12 for profound hypotension and tachycardia w/ HD and started on phenylephrine. PE has been ruled out. AFL captured on tele. Thought to be high output PH primarily in setting of excessive flow through the large left arm AVF. ?Need for fistula ligation. We are asked to assist w/ RHC w/ fistual occlusion to help better assess.   Subjective:    Now off [name]. BP stable on midodrine. In AFL w/ RVR 130s.   Had iHD yesterday and tolerated ok.   Only complaint today is difficulties swallowing solids and liquids.    Objective:   Weight Range: 135 kg Body mass index is 49.53 kg/m.   Vital Signs:   Temp:  [97.6 F (36.4 C)-99 F (37.2 C)] 99 F (37.2 C) (07/14 0000) Pulse Rate:  [67-136] 133 (07/14 0700) Resp:  [12-32] 18 (07/14 0700) BP: (70-140)/(47-109) 70/47 (07/14 0700) SpO2:  [91 %-100 %] 95 % (07/14 0700) Weight:  [135 kg-140 kg] 135 kg (07/14 0500) Last BM Date: 07/01/21  Weight change: Filed Weights   07/01/21 0938 07/01/21 1353 07/02/21 0500  Weight: (!) 140 kg 136 kg 135 kg    Intake/Output:   Intake/Output Summary (Last 24 hours) at 07/02/2021 0741 Last data filed at 07/01/2021 2200 Gross per 24 hour  Intake 911.65 ml  Output 3800 ml  Net -2888.35 ml      Physical Exam    General:  obese female. No respiratory difficulty HEENT: normal Neck: supple. JVD elevated to ear. Carotids 2+ bilat; no bruits. No  lymphadenopathy or thyromegaly appreciated. Cor: PMI nondisplaced. Irregular rhythm, tachy rate. No rubs, gallops or murmurs. Lungs: clear Abdomen: soft, nontender, nondistended. No hepatosplenomegaly. No bruits or masses. Good bowel sounds. Extremities: no cyanosis, clubbing, rash, edema, chronic bilateral LE venous stasis dermatitis + RUE fistula  Neuro: alert & oriented x 3, cranial nerves grossly intact. moves all 4 extremities w/o difficulty. Affect pleasant.   Telemetry   Aflutter 130s   EKG    N/A  Labs    CBC Recent Labs    06/30/21 0609 07/01/21 0550  WBC 6.3 5.1  HGB 9.5* 10.7*  HCT 30.9* 33.1*  MCV 100.0 98.8  PLT 201 153   Basic Metabolic Panel Recent Labs    06/30/21 0825 07/01/21 0550 07/02/21 0053  NA 134* 133* 133*  K 3.9 5.0 5.3*  CL 96* 97* 96*  CO2 _0 GLUCOSE 95 207* 197*  BUN 18 28* 27*  CREATININE 5.36* 6.44* 5.26*  CALCIUM 8.5* 7.9* 8.2*  MG 2.0  --   --    Liver Function Tests No results for input(s): AST, ALT, ALKPHOS, BILITOT, PROT, ALBUMIN in the last 72 hours.  No results for input(s): LIPASE, AMYLASE in the last 72 hours. Cardiac Enzymes No results for input(s): CKTOTAL, CKMB, CKMBINDEX, TROPONINI in the last 72 hours.   BNP: BNP (last 3 results) Recent Labs  06/22/21 1400 06/29/21 1534 07/01/21 0550  BNP 590.8* 718.9* 2,062.9*    ProBNP (last 3 results) No results for input(s): PROBNP in the last 8760 hours.   D-Dimer No results for input(s): DDIMER in the last 72 hours.  Hemoglobin A1C No results for input(s): HGBA1C in the last 72 hours.  Fasting Lipid Panel No results for input(s): CHOL, HDL, LDLCALC, TRIG, CHOLHDL, LDLDIRECT in the last 72 hours. Thyroid Function Tests Recent Labs    06/30/21 0609  TSH 4.633*    Other results:   Imaging    No results found.   Medications:     Scheduled Medications:  amiodarone  200 mg Oral Daily   aspirin  81 mg Oral Daily   calcium acetate  667  mg Oral TID WC   Chlorhexidine Gluconate Cloth  6 each Topical Daily   insulin aspart  0-15 Units Subcutaneous TID WC   insulin aspart  0-5 Units Subcutaneous QHS   mouth rinse  15 mL Mouth Rinse BID   midodrine  10 mg Oral TID WC   sodium chloride flush  3 mL Intravenous Q12H   sodium zirconium cyclosilicate  10 g Oral Once    Infusions:  sodium chloride     sodium chloride     sodium chloride Stopped (06/30/21 0930)   sodium chloride     sodium chloride 10 mL/hr at 07/02/21 0627   phenylephrine ([name]-SYNEPHRINE) Adult infusion Stopped (07/01/21 0040)    PRN Medications: sodium chloride, acetaminophen, diphenhydrAMINE, docusate sodium, lactulose, ondansetron (ZOFRAN) IV, oxyCODONE, polyethylene glycol, sodium chloride flush    Assessment/Plan   1. Acute on chronic diastolic CHF with prominent RV failure: Echo as above with EF 60-65%, moderate LVH, mod-severe RV dysfunction with severe RVE, PASP 69, mod-severe TR, dilated IVC. This is very similar to prior echo from 2018 with RV looking mildly worse.  She is off norepinephrine, on midodrine. Volume controlled through iHD. - She will need ongoing dialysis for volume removal.   2. Pulmonary hypertension: RHC in 8/18 showed severe pulmonary hypertension with relatively low PVR (3.0 WU) and high cardiac output. This was thought to be high output PH primarily in setting of excessive flow through the large left arm AVF. She was not thought to be a candidate for selective pulmonary vasodilators.  PA systolic pressure by echo is similar this admission (though mildly lower) to the echo from 2018.  I suspect that the situation is the same, most likely primarily high output pulmonary hypertension and no role for selective pulmonary vasodilators.  Workup for PE was negative this admission.  - Fluid removal via iHD as above. Support BP w/ midodrine  - Plan RHC today with fistula occlusion to determine potential benefit of fistula ligation. This may  help improve her RV failure.  3. Shock: Suspect primarily septic shock from cellulitis but RV failure likely plays a significant role as well. Resolved, off norepinephrine.  - Continue midodrine 10 mg tid. 4. ID: Cellulitis.  Completed abx course w/ daptomycin 5. Aflutter w/ RVR: HR 130s - Switch to amio gtt, bolus followed by 60/hr  - Eliquis on hold for cath. Cover w/ heparin - Currently off Toprol XL due to low BP  - Plan TEE/DCCV tomorrow  - Suspect underlying OSA. Reports h/o snoring. Will need outpatient sleep study.  6. Renal: ESRD.   - HD per nephrology  7. Dysphagia  - SLP consult for swallow eval   8. Hyperkalemia - K 5.3 today, if no HD planned,  will need Dorcas Mcmurray, PA-C  07/02/2021 7:41 AM  Patient seen with PA, agree with the above note.   She remains in atypical atrial flutter, rate 120s.  She is on po amiodarone.   RHC was done today, with numbers obtained before and after occluding the left arm AVF with BP cuff inflation (heparin bolus given).    RHC Procedural Findings: Hemodynamics (mmHg) RA 22 RV 65/31 PA 68/37, mean 55 PCWP mean 14 Oxygen saturations: PA 62% AO 95% Cardiac Output (Fick) 4.9  Cardiac Index (Fick) 2.09 PVR 8.3 WU Cardiac Output (Thermo) 6.85 Cardiac Index (Thermo) 2.81 PVR 6.0 WU  AFTER OCCLUSION OF AV FISTULA:  PA 66/41, mean 53 Cardiac Output (Thermo) 7.05  General: NAD Neck: JVP 16 cm, no thyromegaly or thyroid nodule.  Lungs: Clear to auscultation bilaterally with normal respiratory effort. CV: Nondisplaced PMI.  Heart tachy, regular S1/S2, no S3/S4, no murmur.  1+ ankle edema.  Abdomen: Soft, nontender, no hepatosplenomegaly, no distention.  Skin: Intact without lesions or rashes.  Neurologic: Alert and oriented x 3.  Psych: Normal affect. Extremities: No clubbing or cyanosis.  HEENT: Normal.   RHC suggests pulmonary arterial hypertension, cardiac output is not significantly elevated.  Occluding the  AV fistula for 5 minutes showed no change in cardiac output or PA pressure.  This is not consistent with high output HF with pulmonary hypertension.   No evidence for PE by CTA chest this admission.  It is certainly possible that her PAH is from OHS (probably most likely).  - Trial of sildenafil 20 mg tid to see if this helps any.   RV failure with normal PCWP and markedly elevated RA pressure.  This will be hard to manage, likely from OHS and pulmonary hypertension.  - Increase midodrine to 15 mg tid to help maintain MAP.  - Needs more fluid off with HD.    Atypical atrial flutter with rate still in the 120s.   - Amiodarone gtt started.  - Restart heparin gtt.  - TEE-guided DCCV tomorrow.   CRITICAL CARE Performed by: Loralie Champagne  Total critical care time: 35 minutes  Critical care time was exclusive of separately billable procedures and treating other patients.  Critical care was necessary to treat or prevent imminent or life-threatening deterioration.  Critical care was time spent personally by me on the following activities: development of treatment plan with patient and/or surrogate as well as nursing, discussions with consultants, evaluation of patient's response to treatment, examination of patient, obtaining history from patient or surrogate, ordering and performing treatments and interventions, ordering and review of laboratory studies, ordering and review of radiographic studies, pulse oximetry and re-evaluation of patient's condition.  Loralie Champagne 07/02/2021 2:20 PM

## 2021-07-02 NOTE — Progress Notes (Signed)
SLP Cancellation Note  Patient Details Name: [name] MRN: [id number] DOB: January 17, 1966   Cancelled treatment:       Reason Eval/Treat Not Completed: Other (comment) Pt is NPO for procedure. D/W RN. Will continue efforts.  Johnpaul Gillentine L. Tivis Ringer, [place] Office number [phone] Pager [phone]    Juan Quam Laurice 07/02/2021, 10:14 AM

## 2021-07-02 NOTE — Progress Notes (Signed)
[facility] Progress Note    Assessment/ Plan:   # ESRD: Outpatient orders: [facility], Monday Wednesday Friday, 255 minutes.  350 BFR/500 DFR  1K, 2.5 calcium, 138 sodium, EDW 135.5 kg, 15-gauge needles, machine temp 37 -Had UF 4L 7/11 but well over EDW - had planned for DUF 7/12 but held in light of hypotension  -Tolerated UF 3.8L yesterday with some hypotension overnight -Next HD tomorrow - making some progress towards EDW and she feels her volume status is appreciably improving   Acute on chronic RV failure with shock -possible that her AVF is a contributing factor - undergoing RHC today and [facility]VS being consulted for AVF ligation and TDC placement. -CTA r/o'd PE -on midodrine 10 TID   Volume/ hypertension: EDW 135.5kg. UF with HD; once [facility] placed can use CRRT for a few days for more substantial UF if needed   Anemia of Chronic Kidney Disease: Currently receiving epo 12k units and venofer 40m. Holding on IV iron   Secondary Hyperparathyroidism/Hyperphosphatemia: sensipar 60 qtx    DM2 with hyperglycemia: mgmt per primary service   # Additional recommendations: - Dose all meds for creatinine clearance < 10 ml/min  - Unless absolutely necessary, no MRIs with gadolinium.  - Implement save arm precautions.  Prefer needle sticks in the dorsum of the hands or wrists.  No blood pressure measurements in arm. - If blood transfusion is requested during hemodialysis sessions, please alert uKoreaprior to the session. - If a hemodialysis catheter line culture is requested, please alert u[place]as only hemodialysis nurses are able to collect those specimens.   LJannifer HickMD [facility] Pager [phone]  Subjective:   UF 3.8 tolerated but then BP low overnight, mildly symptomatic.  .Marland Kitchenhe feels ok currently.  Plans for RHC today and ligation of AVF   Objective:   BP 102/89   Pulse (!) 128   Temp 99 F (37.2 C) (Axillary)   Resp 16   Ht _0  (1.651 m)    Wt 135 kg   SpO2 96%   BMI 49.53 kg/m   Intake/Output Summary (Last 24 hours) at 07/02/2021 [id number] data filed at 07/01/2021 2200 Gross per 24 hour  Intake 643 ml  Output 3800 ml  Net -3157 ml    Weight change: 0.746 kg  Physical Exam: Gen:nad, sitting up in bed CVS:s1s2, rrr Resp: normal wob AYSA:YTKZS soft, nt EWFU:XNATFTDDUKbilateral LE's w/ chronic skin changes Neuro: awake, alert, moves all ext spontaneously, speech clear and coherent HD access: lue avf +b/t   Imaging: CT Angio Chest Pulmonary Embolism (PE) W or WO Contrast  Result Date: 07/01/2021 CLINICAL DATA:  PE suspected EXAM: CT ANGIOGRAPHY CHEST WITH CONTRAST TECHNIQUE: Multidetector CT imaging of the chest was performed using the standard protocol during bolus administration of intravenous contrast. Multiplanar CT image reconstructions and MIPs were obtained to evaluate the vascular anatomy. CONTRAST:  732mOMNIPAQUE IOHEXOL 350 MG/ML SOLN COMPARISON:  Radiograph 05/30/2021 FINDINGS: Cardiovascular: Borderline opacification of the pulmonary arteries. No central, lobar or segmental filling defects are demonstrated. Central pulmonary arteries are enlarged. There is also notable enlargement of the right atrium and ventricle on a background of diffuse cardiomegaly. Reflux of contrast into the hepatic veins and IVC. Coronary artery calcifications. Atherosclerotic plaque within the normal caliber aorta. No acute luminal abnormality of the imaged aorta. No periaortic stranding or hemorrhage. Normal 3 vessel branching of the aortic arch. Proximal great vessels are unremarkable. Mediastinum/Nodes: No mediastinal fluid or gas. Prior  right thyroidectomy, normal left lobe thyroid gland and thoracic inlet. No acute abnormality of the trachea or esophagus. No worrisome mediastinal, hilar or axillary adenopathy. Lungs/Pleura: Layering right pleural effusion with additional trace left pleural fluid. Adjacent passive atelectasis. Dependent  atelectasis bilaterally with additional areas of more bandlike subsegmental atelectasis or scarring. No focal airspace process. Central pulmonary vascular redistribution. Of a without other convincing features of frank edema. No pneumothorax. No concerning nodules or masses. Upper Abdomen: Slightly lobular hepatic surface contour with left lobe hypertrophy. Edematous changes in the upper abdomen small volume ascites. Musculoskeletal: Circumferential body wall edema. Multilevel degenerative changes are present in the imaged portions of the spine. Multilevel flowing anterior osteophytosis, compatible with features of diffuse idiopathic skeletal hyperostosis (DISH). No acute osseous abnormality or suspicious osseous lesion. Review of the MIP images confirms the above findings. IMPRESSION: 1. Borderline opacification of the pulmonary arteries without large central, lobar or proximal segmental filling defects. 2. Features of elevated right heart pressure/right heart failure with right atrial ventricular enlargement, reflux of contrast into the hepatic veins and IVC and pulmonary venous redistribution. 3. Some developing anasarca/volume overload with bilateral effusions, right greater than left, body wall edema and upper abdominal mesenteric edema with ascites. Additional passive and dependent atelectatic changes with further subsegmental atelectasis or scarring. 4. Lobular liver surface contour with left lobe and caudate hypertrophy. Correlate for features of chronic hepatic congestion and/or cirrhosis. Electronically Signed   By: Lovena Le M.D.   On: 07/01/2021 00:57   DG CHEST PORT 1 VIEW  Result Date: 07/01/2021 CLINICAL DATA:  Pulmonary edema.  Short of breath EXAM: PORTABLE CHEST 1 VIEW COMPARISON:  06/29/2021 FINDINGS: Cardiac enlargement. Pulmonary vascular congestion. Diffuse bilateral airspace disease with slight progression. No effusion. IMPRESSION: Mild progression of bilateral airspace disease  compatible with pulmonary edema. Electronically Signed   By: Franchot Gallo M.D.   On: 07/01/2021 09:11    Labs: BMET Recent Labs  Lab 06/27/21 0140 06/29/21 0628 06/30/21 0609 06/30/21 0825 07/01/21 0550 07/02/21 0053  NA 133* 130* 134* 134* 133* 133*  K 4.0 4.3 3.9 3.9 5.0 5.3*  CL 99 93* 97* 96* 97* 96*  CO2 _0 GLUCOSE 141* 102* 107* 95 207* 197*  BUN 38* 30* 17 18 28* 27*  CREATININE 7.81* 6.75* 5.18* 5.36* 6.44* 5.26*  CALCIUM 8.1* 8.4* 8.5* 8.5* 7.9* 8.2*  PHOS 5.1* 5.3*  --   --   --   --     CBC Recent Labs  Lab 06/28/21 0241 06/29/21 0628 06/30/21 0609 07/01/21 0550  WBC 8.5 7.0 6.3 5.1  HGB 9.6* 9.8* 9.5* 10.7*  HCT 30.3* 30.8* 30.9* 33.1*  MCV 98.7 99.0 100.0 98.8  PLT 180 197 201 202     Medications:     aspirin  81 mg Oral Daily   calcium acetate  667 mg Oral TID WC   Chlorhexidine Gluconate Cloth  6 each Topical Daily   insulin aspart  0-15 Units Subcutaneous TID WC   insulin aspart  0-5 Units Subcutaneous QHS   mouth rinse  15 mL Mouth Rinse BID   midodrine  10 mg Oral TID WC   sodium chloride flush  3 mL Intravenous Q12H    Jannifer Hick MD [facility] Pager [phone]

## 2021-07-02 NOTE — Progress Notes (Signed)
[place] for heparin Indication: pulmonary embolus  Allergies  Allergen Reactions   Contrast Media [Iodinated Diagnostic Agents] Anaphylaxis, Hives, Swelling and Other (See Comments)    Dye for cardiac cath. Tongue swells   Pneumococcal Vaccines Swelling and Other (See Comments)    Turns skin black, and bodily swelling   Vancomycin Nausea And Vomiting and Other (See Comments)    Infusion "made me feel like I was dying" had to be readmitted to hospital    Patient Measurements: Height: _0  (165.1 cm) Weight: (!) 137 kg (302 lb 0.5 oz) IBW/kg (Calculated) : 57 Heparin Dosing Weight:92 kg  Vital Signs: Temp: 97.7 F (36.5 C) (07/14 1117) Temp Source: Oral (07/14 1117) BP: 110/60 (07/14 1329) Pulse Rate: 128 (07/14 1339)  Labs: Recent Labs    06/30/21 0609 06/30/21 0825 06/30/21 1403 06/30/21 2138 07/01/21 0550 07/02/21 0053  HGB 9.5*  --   --   --  10.7*  --   HCT 30.9*  --   --   --  33.1*  --   PLT 201  --   --   --  202  --   HEPARINUNFRC  --   --  0.49 0.46  --   --   CREATININE 5.18* 5.36*  --   --  6.44* 5.26*  TROPONINIHS 149* 168*  --   --  117*  --      Estimated Creatinine Clearance: 17.2 mL/min (A) (by C-G formula based on SCr of 5.26 mg/dL (H)).   Medical History: Past Medical History:  Diagnosis Date   Allergy    Phreesia 11/04/2020   Anemia, unspecified    Anxiety    Arthritis    Phreesia 11/04/2020   Asthma    as a child   Cancer ([place])    thyroid   Cellulitis    CHF (congestive heart failure) ([place])    Chiari malformation    s/p surgery   Chiari malformation    Chronic kidney disease    Phreesia 11/04/2020   Chronic kidney disease, stage 5 (HCC)    Chronic obstructive pulmonary disease, unspecified ([place])    Chronic pain    Complication of anesthesia 11/28/15   Resp arrest after  conscious  sedation   Compression of brain (HCC)    Constipation    COPD (chronic obstructive pulmonary disease)  ([place])    Phreesia 11/04/2020   Coronary artery disease    40-50% mid LAD 04/29/09, Medical tx. (Dr. Gwenlyn Found)   Diabetes mellitus    Type II- reports being off all medication d/t it being controlled   Diabetes mellitus without complication ([place])    Phreesia 11/04/2020   Essential (primary) hypertension    Fibromyalgia    Fibromyalgia    History of blood transfusion    hemorrage duinrg pregancy   Hx of echocardiogram 10/2011   EF 55-60%   Hypercholesterolemia    Hyperlipidemia, unspecified    Hypertension    Hypertensive chronic kidney disease with stage 1 through stage 4 chronic kidney disease, or unspecified chronic kidney disease    Hypertensive chronic kidney disease with stage 5 chronic kidney disease or end stage renal disease (HCC)    Lymph edema    Lymphedema, not elsewhere classified    Morbid (severe) obesity due to excess calories ([place])    Obesity hypoventilation syndrome ([place])    On home oxygen therapy    "2L; 24/7" (12/21/2018)   Peritonitis, unspecified ([place])  Pneumonia    in past   Pulmonary hypertension ([place])    Renal disorder    M/W/F [facility] Pt started dialysis in Dec.2016   S/P colonoscopy 05/26/2007   Dr. Laural Golden sigmoid diverticulosis random biopsies benign   S/P endoscopy 05/01/2009   Dr. Penelope Coop pill-induced esophageal ulcerations distal to midesophagus, 2 small ulcers in the antrum of the stomach   Shortness of breath dyspnea    with any exertion or if heart rate  is irregular while on dialysis   Sleep apnea    reports that she no longer needs CPAP due to weight loss   Sleep apnea, unspecified    Type 2 diabetes mellitus with diabetic nephropathy ([place])    Type 2 diabetes mellitus with hyperglycemia (HCC)     Medications:  Medications Prior to Admission  Medication Sig Dispense Refill Last Dose   acetaminophen (TYLENOL) 325 MG tablet Take 2 tablets (650 mg total) by mouth every 4 (four) hours as needed for mild pain (or Fever >/= 101). 15  tablet 1 PRN   calcium acetate (PHOSLO) 667 MG capsule Take 1,334 mg by mouth 3 (three) times daily.   06/21/2021   calcium carbonate (TUMS - DOSED IN MG ELEMENTAL CALCIUM) 500 MG chewable tablet Chew 2 tablets by mouth 2 (two) times daily.   06/21/2021   cephALEXin (KEFLEX) 500 MG capsule Take 1 capsule (500 mg total) by mouth 2 (two) times daily. Try to take this every 12 hours. On dialysis days, take the first dose after dialysis and the second at bedtime. 10 capsule 0 06/21/2021   ibuprofen (ADVIL) 200 MG tablet Take 400 mg by mouth every 6 (six) hours as needed for moderate pain.   Past Week   lanthanum (FOSRENOL) 500 MG chewable tablet Chew 1,000-1,500 mg by mouth 5 (five) times daily. 1500 mg by mouth three times a day with meals and 1000 mg by mouth twice a day with snacks   06/21/2021   loperamide (IMODIUM) 2 MG capsule Take 2-4 mg by mouth as needed for diarrhea or loose stools.    PRN   multivitamin (RENA-VIT) TABS tablet Take 1 tablet by mouth daily.  11 06/21/2021   Oxycodone HCl 10 MG TABS Take 1 tablet (10 mg total) by mouth 4 (four) times daily as needed (pain). 10 tablet 0 06/21/2021   Sennosides (EX-LAX PO) Take 2 tablets by mouth daily as needed (constipation).   PRN   clindamycin (CLEOCIN) 300 MG capsule Take 1 capsule (300 mg total) by mouth 3 (three) times daily. 16 capsule 0    lactulose (CHRONULAC) 10 GM/15ML solution Take 45 mLs (30 g total) by mouth 2 (two) times daily. For constipation (Patient not taking: Reported on 06/21/2021) 236 mL 0 Not Taking    Assessment: 55 yo female on heparin until PE ruled out (Chest CTA planned).  She was not on anticoagulation PTA.   Heparin switched to Eliquis yesterday, then discontinued for cath this AM.  Now s/p RHC, pharmacy asked to resume IV heparin 6 hrs after cath.  Will need DCCV tomorrow, then procedure for fistula at some point.  Received one dose 7/13 AM which may falsely elevate heparin level.  Monitor based on aPTT.  Goal of Therapy:   Heparin level 0.3-0.7 units/ml Monitor platelets by anticoagulation protocol: Yes   Plan:  -Restart IV heparin at 1600 units/hr at 8 pm tonight. -Check aPTT/heparin level 6 hrs after gtt resumed -Daily heparin level, aPTT and CBC. -F/u plans for oral  anticoagulation eventually.  [place] Crane, Roylene Reason, BCCP Clinical Pharmacist  07/02/2021 2:28 PM   [facility] pharmacy phone numbers are listed on [email or web address]

## 2021-07-03 ENCOUNTER — Inpatient Hospital Stay (HOSPITAL_COMMUNITY): Payer: 59

## 2021-07-03 ENCOUNTER — Inpatient Hospital Stay (HOSPITAL_COMMUNITY): Payer: 59 | Admitting: Anesthesiology

## 2021-07-03 ENCOUNTER — Encounter (HOSPITAL_COMMUNITY): Payer: Self-pay | Admitting: Cardiology

## 2021-07-03 ENCOUNTER — Encounter (HOSPITAL_COMMUNITY)
Admission: EM | Disposition: A | Discharge status: Home Health | Payer: Self-pay | Source: Home / Self Care | Attending: Internal Medicine

## 2021-07-03 DIAGNOSIS — I361 Nonrheumatic tricuspid (valve) insufficiency: Secondary | ICD-10-CM | POA: Diagnosis not present

## 2021-07-03 DIAGNOSIS — I50810 Right heart failure, unspecified: Secondary | ICD-10-CM | POA: Diagnosis not present

## 2021-07-03 DIAGNOSIS — I272 Pulmonary hypertension, unspecified: Secondary | ICD-10-CM | POA: Diagnosis not present

## 2021-07-03 DIAGNOSIS — R6521 Severe sepsis with septic shock: Secondary | ICD-10-CM | POA: Diagnosis not present

## 2021-07-03 DIAGNOSIS — I132 Hypertensive heart and chronic kidney disease with heart failure and with stage 5 chronic kidney disease, or end stage renal disease: Secondary | ICD-10-CM | POA: Diagnosis not present

## 2021-07-03 DIAGNOSIS — I4892 Unspecified atrial flutter: Secondary | ICD-10-CM

## 2021-07-03 DIAGNOSIS — N186 End stage renal disease: Secondary | ICD-10-CM | POA: Diagnosis not present

## 2021-07-03 DIAGNOSIS — A419 Sepsis, unspecified organism: Secondary | ICD-10-CM | POA: Diagnosis not present

## 2021-07-03 DIAGNOSIS — L03115 Cellulitis of right lower limb: Secondary | ICD-10-CM | POA: Diagnosis not present

## 2021-07-03 DIAGNOSIS — Z20822 Contact with and (suspected) exposure to covid-19: Secondary | ICD-10-CM | POA: Diagnosis not present

## 2021-07-03 HISTORY — PX: CARDIOVERSION: SHX1299

## 2021-07-03 HISTORY — PX: TEE WITHOUT CARDIOVERSION: SHX5443

## 2021-07-03 LAB — GLUCOSE, CAPILLARY
Glucose-Capillary: 75 mg/dL (ref 70–99)
Glucose-Capillary: 80 mg/dL (ref 70–99)
Glucose-Capillary: 105 mg/dL — ABNORMAL HIGH (ref 70–99)

## 2021-07-03 LAB — CBC
HCT: 33.6 % — ABNORMAL LOW (ref 36.0–46.0)
Hemoglobin: 10.5 g/dL — ABNORMAL LOW (ref 12.0–15.0)
MCH: 31.2 pg (ref 26.0–34.0)
MCHC: 31.3 g/dL (ref 30.0–36.0)
MCV: 99.7 fL (ref 80.0–100.0)
Platelets: 282 10*3/uL (ref 150–400)
RBC: 3.37 MIL/uL — ABNORMAL LOW (ref 3.87–5.11)
RDW: 16 % — ABNORMAL HIGH (ref 11.5–15.5)
WBC: 11 10*3/uL — ABNORMAL HIGH (ref 4.0–10.5)
nRBC: 1.5 % — ABNORMAL HIGH (ref 0.0–0.2)

## 2021-07-03 LAB — HEPARIN LEVEL (UNFRACTIONATED): Heparin Unfractionated: 0.99 IU/mL — ABNORMAL HIGH (ref 0.30–0.70)

## 2021-07-03 LAB — APTT
aPTT: 52 seconds — ABNORMAL HIGH (ref 24–36)
aPTT: 81 seconds — ABNORMAL HIGH (ref 24–36)

## 2021-07-03 SURGERY — ECHOCARDIOGRAM, TRANSESOPHAGEAL
Anesthesia: General

## 2021-07-03 MED ORDER — KETAMINE HCL 50 MG/5ML IJ SOSY
PREFILLED_SYRINGE | INTRAMUSCULAR | Status: AC
  Filled 2021-07-03: qty 5

## 2021-07-03 MED ORDER — KETAMINE HCL 10 MG/ML IJ SOLN
INTRAMUSCULAR | Status: DC | PRN
  Administered 2021-07-03: 30 mg via INTRAVENOUS

## 2021-07-03 MED ORDER — PROPOFOL 1000 MG/100ML IV EMUL
INTRAVENOUS | Status: AC
  Filled 2021-07-03: qty 100

## 2021-07-03 MED ORDER — LIDOCAINE 2% (20 MG/ML) 5 ML SYRINGE
INTRAMUSCULAR | Status: DC | PRN
  Administered 2021-07-03: 40 mg via INTRAVENOUS
  Administered 2021-07-03: 60 mg via INTRAVENOUS

## 2021-07-03 MED ORDER — HEPARIN BOLUS VIA INFUSION
2000.0000 [IU] | Freq: Once | INTRAVENOUS | Status: AC
  Administered 2021-07-03: 2000 [IU] via INTRAVENOUS
  Filled 2021-07-03: qty 2000

## 2021-07-03 MED ORDER — SODIUM CHLORIDE 0.9 % IV SOLN: INTRAVENOUS | Status: DC

## 2021-07-03 MED ORDER — PROPOFOL 500 MG/50ML IV EMUL
INTRAVENOUS | Status: DC | PRN
  Administered 2021-07-03: 80 ug/kg/min via INTRAVENOUS

## 2021-07-03 MED ORDER — PROPOFOL 10 MG/ML IV BOLUS
INTRAVENOUS | Status: DC | PRN
  Administered 2021-07-03: 20 mg via INTRAVENOUS

## 2021-07-03 NOTE — Anesthesia Preprocedure Evaluation (Addendum)
Anesthesia Evaluation  Patient identified by MRN, date of birth, ID band Patient awake    Reviewed: Allergy & Precautions, NPO status , Patient's Chart, lab work & pertinent test results  Airway Mallampati: III  TM Distance: >3 FB Neck ROM: Full    Dental  (+) Edentulous Upper, Edentulous Lower, Dental Advisory Given   Pulmonary shortness of breath, asthma , sleep apnea , COPD (2L O2),  oxygen dependent,    Pulmonary exam normal breath sounds clear to auscultation       Cardiovascular hypertension, + CAD and +CHF  Normal cardiovascular exam+ dysrhythmias Atrial Fibrillation  Rhythm:Irregular Rate:Normal  TTE 2022 1. Left ventricular ejection fraction, by estimation, is 60 to 65%. The  left ventricle has normal function. The left ventricle has no regional  wall motion abnormalities. There is moderate concentric left ventricular  hypertrophy. Left ventricular  diastolic parameters are consistent with Grade I diastolic dysfunction  (impaired relaxation). The left ventricular cavity is small and may be  underfilled. There is no evidence of LVTO Obstruction.  2. Right ventricular systolic function is moderately to severely reduced.  The right ventricular size is severely enlarged. There is severely  elevated pulmonary artery systolic pressure. The estimated right  ventricular systolic pressure is 25.0 mmHg.  3. Tricuspid valve regurgitation is moderate to severe and functional in  nature.  4. Right atrial size was severely dilated.  5. The aortic valve is tricuspid. There is mild thickening of the aortic  valve. Aortic valve regurgitation is not visualized. No aortic stenosis is  present. Aortic valve mean gradient measures 4.0 mmHg.  6. The mitral valve is grossly normal. No evidence of mitral valve  regurgitation. The mean mitral valve gradient is 1.0 mmHg with average  heart rate of 79 bpm.  7. The inferior vena cava is  dilated in size with <50% respiratory  variability, suggesting right atrial pressure of 15 mmHg  RHC 2022 Markedly elevated RV filling pressure. 2. Pulmonary arterial hypertension. 3. Normal PCWP 4. Normal cardiac output.  The PA pressure and cardiac output did not change with occlusion of AV fistula x 5 minutes.    Neuro/Psych PSYCHIATRIC DISORDERS Anxiety Chiari s/p craniectomy negative neurological ROS     GI/Hepatic negative GI ROS, Neg liver ROS,   Endo/Other  diabetes, Type 2, Insulin DependentMorbid obesity (BMI 52)  Renal/GU Dialysis and ESRFRenal disease (dialysis MWF)  negative genitourinary   Musculoskeletal  (+) Arthritis , Fibromyalgia -  Abdominal   Peds  Hematology negative hematology ROS (+)   Anesthesia Other Findings 55 y/o female w/ chronic diastolic CHF with prominent RV failure, pulmonary HTN, ESRD on HD admitted w/ septic shock 2/2 LE cellulitis. Transferred back to ICU 7/12 for profound hypotension and tachycardia w/ HD. HR 120s in atypical atrial flutter. She is on heparin gtt, neo gtt, amiodarone gtt, and midodrine.   Reproductive/Obstetrics                           Anesthesia Physical Anesthesia Plan  ASA: 4  Anesthesia Plan: General   Post-op Pain Management:    Induction: Intravenous  PONV Risk Score and Plan: Propofol infusion and Treatment may vary due to age or medical condition  Airway Management Planned: Natural Airway  Additional Equipment:   Intra-op Plan:   Post-operative Plan:   Informed Consent: I have reviewed the patients History and Physical, chart, labs and discussed the procedure including the risks, benefits and alternatives for the  proposed anesthesia with the patient or authorized representative who has indicated his/her understanding and acceptance.     Dental advisory given  Plan Discussed with: CRNA  Anesthesia Plan Comments:        Anesthesia Quick Evaluation

## 2021-07-03 NOTE — CV Procedure (Addendum)
Procedure: TEE  Sedation: Per anesthesiology  Indication: Atrial flutter  Findings: Please see echo section for full report.  Small LV, compressed by enlarged RV.  Mild LV hypertrophy.  EF 55-60%, normal wall motion.  Severely dilated and moderately dysfunctional RV, D-shaped septum consistent with RV pressure/volume overload. Moderate TR with peak RV-RA gradient 55 mmHg.  Severe right atrial enlargement, no PFO/ASD by color doppler.  Mild left atrial enlargement, no LA appendage thrombus.  Trivial mitral regurgitation, mildly calcified mitral valve and annulus.  Trileaflet aortic valve with trivial regurgitation, no stenosis.  Normal caliber thoracic aorta with grade 3 plaque noted.   May proceed with DCCV.   Loralie Champagne 07/03/2021 12:13 PM

## 2021-07-03 NOTE — Progress Notes (Signed)
RT note. Patient CPAP all set up for the night. Currently set to auto 18/5 with 4L bled in line. Will place on patient when ready for bed. RT will continue to monitor

## 2021-07-03 NOTE — Interval H&P Note (Signed)
History and Physical Interval Note:  07/03/2021 11:46 AM  [name]  has presented today for surgery, with the diagnosis of hp - heart failure.  The various methods of treatment have been discussed with the patient and family. After consideration of risks, benefits and other options for treatment, the patient has consented to  Procedure(s): RIGHT HEART CATH (N/A) as a surgical intervention.  The patient's history has been reviewed, patient examined, no change in status, stable for surgery.  I have reviewed the patient's chart and labs.  Questions were answered to the patient's satisfaction.     Benson Porcaro [employer]

## 2021-07-03 NOTE — Progress Notes (Signed)
[place] for heparin Indication: pulmonary embolus  Allergies  Allergen Reactions   Contrast Media [Iodinated Diagnostic Agents] Anaphylaxis, Hives, Swelling and Other (See Comments)    Dye for cardiac cath. Tongue swells   Pneumococcal Vaccines Swelling and Other (See Comments)    Turns skin black, and bodily swelling   Vancomycin Nausea And Vomiting and Other (See Comments)    Infusion "made me feel like I was dying" had to be readmitted to hospital    Patient Measurements: Height: _0  (165.1 cm) Weight: (!) 141 kg (310 lb 13.6 oz) IBW/kg (Calculated) : 57 Heparin Dosing Weight:92 kg  Vital Signs: Temp: 97.8 F (36.6 C) (07/15 1214) Temp Source: Temporal (07/15 1214) BP: 112/63 (07/15 1224) Pulse Rate: 69 (07/15 1224)  Labs: Recent Labs    06/30/21 2138 07/01/21 0550 07/02/21 0053 07/02/21 1309 07/02/21 1310 07/02/21 1321 07/03/21 0442  HGB  --  10.7*  --    < > 11.9* 11.6* 10.5*  HCT  --  33.1*  --    < > 35.0* 34.0* 33.6*  PLT  --  202  --   --   --   --  282  APTT  --   --   --   --   --   --  52*  HEPARINUNFRC 0.46  --   --   --   --   --  0.99*  CREATININE  --  6.44* 5.26*  --   --   --   --   TROPONINIHS  --  117*  --   --   --   --   --    < > = values in this interval not displayed.     Estimated Creatinine Clearance: 17.5 mL/min (A) (by C-G formula based on SCr of 5.26 mg/dL (H)).   Medical History: Past Medical History:  Diagnosis Date   Allergy    Phreesia 11/04/2020   Anemia, unspecified    Anxiety    Arthritis    Phreesia 11/04/2020   Asthma    as a child   Cancer ([place])    thyroid   Cellulitis    CHF (congestive heart failure) ([place])    Chiari malformation    s/p surgery   Chiari malformation    Chronic kidney disease    Phreesia 11/04/2020   Chronic kidney disease, stage 5 (HCC)    Chronic obstructive pulmonary disease, unspecified ([place])    Chronic pain    Complication of anesthesia  11/28/15   Resp arrest after  conscious  sedation   Compression of brain (HCC)    Constipation    COPD (chronic obstructive pulmonary disease) ([place])    Phreesia 11/04/2020   Coronary artery disease    40-50% mid LAD 04/29/09, Medical tx. (Dr. Gwenlyn Found)   Diabetes mellitus    Type II- reports being off all medication d/t it being controlled   Diabetes mellitus without complication ([place])    Phreesia 11/04/2020   Essential (primary) hypertension    Fibromyalgia    Fibromyalgia    History of blood transfusion    hemorrage duinrg pregancy   Hx of echocardiogram 10/2011   EF 55-60%   Hypercholesterolemia    Hyperlipidemia, unspecified    Hypertension    Hypertensive chronic kidney disease with stage 1 through stage 4 chronic kidney disease, or unspecified chronic kidney disease    Hypertensive chronic kidney disease with stage 5 chronic kidney disease or end stage  renal disease ([place])    Lymph edema    Lymphedema, not elsewhere classified    Morbid (severe) obesity due to excess calories (HCC)    Obesity hypoventilation syndrome ([place])    On home oxygen therapy    "2L; 24/7" (12/21/2018)   Peritonitis, unspecified ([place])    Pneumonia    in past   Pulmonary hypertension ([place])    Renal disorder    M/W/F [facility] Pt started dialysis in Dec.2016   S/P colonoscopy 05/26/2007   Dr. Laural Golden sigmoid diverticulosis random biopsies benign   S/P endoscopy 05/01/2009   Dr. Penelope Coop pill-induced esophageal ulcerations distal to midesophagus, 2 small ulcers in the antrum of the stomach   Shortness of breath dyspnea    with any exertion or if heart rate  is irregular while on dialysis   Sleep apnea    reports that she no longer needs CPAP due to weight loss   Sleep apnea, unspecified    Type 2 diabetes mellitus with diabetic nephropathy ([place])    Type 2 diabetes mellitus with hyperglycemia (HCC)     Medications:  Medications Prior to Admission  Medication Sig Dispense Refill Last Dose    acetaminophen (TYLENOL) 325 MG tablet Take 2 tablets (650 mg total) by mouth every 4 (four) hours as needed for mild pain (or Fever >/= 101). 15 tablet 1 PRN   calcium acetate (PHOSLO) 667 MG capsule Take 1,334 mg by mouth 3 (three) times daily.   06/21/2021   calcium carbonate (TUMS - DOSED IN MG ELEMENTAL CALCIUM) 500 MG chewable tablet Chew 2 tablets by mouth 2 (two) times daily.   06/21/2021   cephALEXin (KEFLEX) 500 MG capsule Take 1 capsule (500 mg total) by mouth 2 (two) times daily. Try to take this every 12 hours. On dialysis days, take the first dose after dialysis and the second at bedtime. 10 capsule 0 06/21/2021   ibuprofen (ADVIL) 200 MG tablet Take 400 mg by mouth every 6 (six) hours as needed for moderate pain.   Past Week   lanthanum (FOSRENOL) 500 MG chewable tablet Chew 1,000-1,500 mg by mouth 5 (five) times daily. 1500 mg by mouth three times a day with meals and 1000 mg by mouth twice a day with snacks   06/21/2021   loperamide (IMODIUM) 2 MG capsule Take 2-4 mg by mouth as needed for diarrhea or loose stools.    PRN   multivitamin (RENA-VIT) TABS tablet Take 1 tablet by mouth daily.  11 06/21/2021   Oxycodone HCl 10 MG TABS Take 1 tablet (10 mg total) by mouth 4 (four) times daily as needed (pain). 10 tablet 0 06/21/2021   Sennosides (EX-LAX PO) Take 2 tablets by mouth daily as needed (constipation).   PRN   clindamycin (CLEOCIN) 300 MG capsule Take 1 capsule (300 mg total) by mouth 3 (three) times daily. 16 capsule 0    lactulose (CHRONULAC) 10 GM/15ML solution Take 45 mLs (30 g total) by mouth 2 (two) times daily. For constipation (Patient not taking: Reported on 06/21/2021) 236 mL 0 Not Taking    Assessment: 55 yo female on heparin until PE ruled out (Chest CTA planned).  She was not on anticoagulation PTA.   Heparin switched to Eliquis yesterday, then discontinued for cath this AM.  Now s/p RHC, pharmacy asked to resume IV heparin 6 hrs after cath.  Will need DCCV tomorrow, then  procedure for fistula at some point.  Received one dose 7/13 AM which may falsely elevate  heparin level.  Monitor based on aPTT.  S/p cardioversion this morning.  Heparin discovered to not be running at 1445 PM.  Not sure when it was stopped.  Goal of Therapy:  Heparin level 0.3-0.7 units/ml Monitor platelets by anticoagulation protocol: Yes   Plan:  -Heparin 2000 unit IV bolus x 1. -Restart heparin gtt at 1700 units/hr. -aPTT in 8 hrs. -Daily heparin level, aPTT and CBC. -F/u plans for oral anticoagulation eventually.  [place] Crane, Roylene Reason, BCCP Clinical Pharmacist  07/03/2021 2:45 PM   [facility] pharmacy phone numbers are listed on [email or web address]

## 2021-07-03 NOTE — Progress Notes (Signed)
Occupational Therapy Treatment Patient Details Name: [name] MRN: [id number] DOB: 01-May-1966 Today's Date: 07/03/2021    History of present illness 55 y.o. female presenting from [facility] ED 7/3 with lethargy, hypotension and elevated lactic acid after d/c from [facility] earlier that day. Admitted at [facility] several days ago with cellulitis, given IV antibiotics and d/c'd home secondary to passing of father. This admission patient found to have AMS, septic shock, acute respiratory failure, acute on chronic RV failure. Transferred to ICU on 7/12 due to hypotension. PMHX significant for obesity, DMII, COPD, CAD, lymphedema, ESRD on HD MWF and CHF.   OT comments  This 55 yo female seen today for Bil UE exercises at bed level. Pt was able to complete level 1 theraband and tan theraputty exercises with only mild difficulty and S. Pt plans on doing these all weekend so she can move up in resistance next OT session. We will continue to follow.  Follow Up Recommendations  SNF;Supervision/Assistance - 24 hour    Equipment Recommendations  Other (comment) (TBD next venue)          Precautions / Restrictions Precautions Precautions: Fall Restrictions Weight Bearing Restrictions: No                  Cognition Arousal/Alertness: Awake/alert Behavior During Therapy: WFL for tasks assessed/performed Overall Cognitive Status: Within Functional Limits for tasks assessed                                          Exercises Other Exercises Other Exercises: Brought Level 1 theraband and tan putty to instrut pt in using. She was able to do 10 reps/hold for 3 seconds of 3 UE exercises (punch towards feet, punch towards ceiling, reach across). Pt reports she will work up to 20 reps hold for 5 seconds over the weekend. Next session we will up resistance band. Also instructed in theraputty exercises with tan putty (squeeze and manipulate in hand, thumb pushes and pinch-n-pull)--will upgrade to  red putty next session.           Pertinent Vitals/ Pain       Pain Assessment: Faces Faces Pain Scale: Hurts little more Pain Location: back Pain Descriptors / Indicators: Aching;Sore Pain Intervention(s): Limited activity within patient's tolerance;Repositioned         Frequency  Min 2X/week        Progress Toward Goals  OT Goals(current goals can now be found in the care plan section)  Progress towards OT goals: Progressing toward goals  Acute Rehab OT Goals Patient Stated Goal: To return home with husband and granddaughter. OT Goal Formulation: With patient Time For Goal Achievement: 07/08/21 Potential to Achieve Goals: [place] Discharge plan remains appropriate;Frequency remains appropriate       AM-PAC OT "6 Clicks" Daily Activity     Outcome Measure   Help from another person eating meals?: None Help from another person taking care of personal grooming?: A Little Help from another person toileting, which includes using toliet, bedpan, or urinal?: Total Help from another person bathing (including washing, rinsing, drying)?: Total Help from another person to put on and taking off regular upper body clothing?: A Little Help from another person to put on and taking off regular lower body clothing?: Total 6 Click Score: 13    End of Session Equipment Utilized During Treatment: Oxygen  OT Visit Diagnosis: Other  abnormalities of gait and mobility (R26.89);Muscle weakness (generalized) (M62.81);Pain Pain - part of body:  (back)   Activity Tolerance Patient tolerated treatment well   Patient Left in bed;with call bell/phone within reach;with bed alarm set             Time: 4862-8241 OT Time Calculation (min): 21 min  Charges: OT General Charges $OT Visit: 1 Visit OT Treatments $Therapeutic Exercise: 8-22 mins  [name], OTR/L Acute [employer] Pager [phone] Office [phone]     Almon Register 07/03/2021, 8:40 AM

## 2021-07-03 NOTE — Procedures (Signed)
Electrical Cardioversion Procedure Note MAJESTI GAMBRELL [id number] Aug 25, 1966  Procedure: Electrical Cardioversion Indications:  Atrial flutter  Procedure Details Consent: Risks of procedure as well as the alternatives and risks of each were explained to the (patient/caregiver).  Consent for procedure obtained. Time Out: Verified patient identification, verified procedure, site/side was marked, verified correct patient position, special equipment/implants available, medications/allergies/relevent history reviewed, required imaging and test results available.  Performed  Patient placed on cardiac monitor, pulse oximetry, supplemental oxygen as necessary.  Sedation given:  Propofol per anesthesiology Pacer pads placed anterior and posterior chest.  Cardioverted 1 time(s).  Cardioverted at 150J.  Evaluation Findings: Post procedure EKG shows: NSR Complications: None Patient did tolerate procedure well.   Loralie Champagne 07/03/2021, 12:14 PM

## 2021-07-03 NOTE — Progress Notes (Signed)
[facility] Progress Note    Assessment/ Plan:   # ESRD: Outpatient orders: [facility], Monday Wednesday Friday, 255 minutes.  350 BFR/500 DFR  1K, 2.5 calcium, 138 sodium, EDW 135.5 kg, 15-gauge needles, machine temp 37 -Had UF 4L 7/11 but well over EDW - had planned for DUF 7/12 but held in light of hypotension  -Tolerated UF 3.8L 7/13  -Next HD now - making some progress towards EDW and she feels her volume status is appreciably improving   Acute on chronic RV failure with shock, pulmonary HTN -CTA r/o'd PE -TTE with normal EF, mod LVH and severe RV dysfunction -RHC with elevated RA pressure (22), no marked change in pressures or CO with AVF occlusion -on midodrine 10 TID -sildenafil trial being started -Her AVF does not appear to be very high flow and I think potential benefit with fistula ligation is small and I hate to commit her to chronic dialysis with a catheter.  For now we will maintain her AVF, not ligate it.    Volume/ hypertension: EDW 135.5kg. UF with HD.  Well over EDW still but making some progress.    Anemia of Chronic Kidney Disease: Hb in 10s without ESA here to date.    Secondary Hyperparathyroidism/Hyperphosphatemia: sensipar 40 qtx    DM2 with hyperglycemia: mgmt per primary service   # Additional recommendations: - Dose all meds for creatinine clearance < 10 ml/min  - Unless absolutely necessary, no MRIs with gadolinium.  - Implement save arm precautions.  Prefer needle sticks in the dorsum of the hands or wrists.  No blood pressure measurements in arm. - If blood transfusion is requested during hemodialysis sessions, please alert [place] prior to the session. - If a hemodialysis catheter line culture is requested, please alert [place] as only hemodialysis nurses are able to collect those specimens.   Jannifer Hick MD [facility] Pager [phone]   Subjective:   Back on low dose phenylephrine overnight.  S/p DCCV today for  AFl, in NSR currently.  For HD now.   She has no complaints    Objective:   BP 112/63   Pulse 69   Temp 97.8 F (36.6 C) (Temporal)   Resp (!) 23   Ht 5' 5" (1.651 m)   Wt (!) 141 kg   SpO2 94%   BMI 51.73 kg/m   Intake/Output Summary (Last 24 hours) at 07/03/2021 1437 Last data filed at 07/03/2021 1205 Gross per 24 hour  Intake 693.85 ml  Output 0 ml  Net 693.85 ml    Weight change: -3 kg  Physical Exam: Gen:nad, sitting up in bed CVS:s1s2, rrr Resp: normal wob JXB:JYNWG, soft, nt NFA:[id number] bilateral LE's w/ chronic skin changes Neuro: awake, alert, moves all ext spontaneously, speech clear and coherent HD access: lue avf +b/t   Imaging: CARDIAC CATHETERIZATION  Result Date: 07/02/2021 1. Markedly elevated RV filling pressure. 2. Pulmonary arterial hypertension. 3. Normal PCWP 4. Normal cardiac output. The PA pressure and cardiac output did not change with occlusion of AV fistula x 5 minutes.    Labs: BMET Recent Labs  Lab 06/27/21 0140 06/29/21 6962 06/30/21 0609 06/30/21 0825 07/01/21 0550 07/02/21 0053 07/02/21 1309 07/02/21 1310 07/02/21 1321  NA 133* 130* 134* 134* 133* 133* 135 135 135  K 4.0 4.3 3.9 3.9 5.0 5.3* 4.3 4.2 4.0  CL 99 93* 97* 96* 97* 96*  --   --   --   CO2 _0 --   --   --  GLUCOSE 141* 102* 107* 95 207* 197*  --   --   --   BUN 38* 30* 17 18 28* 27*  --   --   --   CREATININE 7.81* 6.75* 5.18* 5.36* 6.44* 5.26*  --   --   --   CALCIUM 8.1* 8.4* 8.5* 8.5* 7.9* 8.2*  --   --   --   PHOS 5.1* 5.3*  --   --   --   --   --   --   --     CBC Recent Labs  Lab 06/29/21 0628 06/30/21 0609 07/01/21 0550 07/02/21 1309 07/02/21 1310 07/02/21 1321 07/03/21 0442  WBC 7.0 6.3 5.1  --   --   --  11.0*  HGB 9.8* 9.5* 10.7* 11.9* 11.9* 11.6* 10.5*  HCT 30.8* 30.9* 33.1* 35.0* 35.0* 34.0* 33.6*  MCV 99.0 100.0 98.8  --   --   --  99.7  PLT 197 201 202  --   --   --  282     Medications:     aspirin  81 mg Oral  Daily   calcium acetate  667 mg Oral TID WC   Chlorhexidine Gluconate Cloth  6 each Topical Daily   insulin aspart  0-15 Units Subcutaneous TID WC   insulin aspart  0-5 Units Subcutaneous QHS   mouth rinse  15 mL Mouth Rinse BID   midodrine  15 mg Oral TID WC   sildenafil  20 mg Oral TID   sodium chloride flush  3 mL Intravenous Q12H    Jannifer Hick MD [facility] Pager [phone]

## 2021-07-03 NOTE — Progress Notes (Signed)
SLP Cancellation Note  Patient Details Name: [name] MRN: [id number] DOB: [birth year]   Cancelled treatment:       Reason Eval/Treat Not Completed: Medical issues which prohibited therapy. Discussed with RN - pt is currently NPO pending TEE, unsure timing of procedure. Will f/u as able for swallowing evaluation.     Osie Bond., M.A. [facility] Pager [phone] Office [phone]  07/03/2021, 8:17 AM

## 2021-07-03 NOTE — Progress Notes (Signed)
[place] for heparin Indication: pulmonary embolus  Allergies  Allergen Reactions   Contrast Media [Iodinated Diagnostic Agents] Anaphylaxis, Hives, Swelling and Other (See Comments)    Dye for cardiac cath. Tongue swells   Pneumococcal Vaccines Swelling and Other (See Comments)    Turns skin black, and bodily swelling   Vancomycin Nausea And Vomiting and Other (See Comments)    Infusion "made me feel like I was dying" had to be readmitted to hospital    Patient Measurements: Height: _0  (165.1 cm) Weight: (!) 137 kg (302 lb 0.5 oz) IBW/kg (Calculated) : 57 Heparin Dosing Weight:92 kg  Vital Signs: Temp: 97.9 F (36.6 C) (07/15 0300) Temp Source: Oral (07/15 0300) BP: 81/61 (07/15 0300) Pulse Rate: 121 (07/15 0300)  Labs: Recent Labs    06/30/21 0609 06/30/21 0825 06/30/21 1403 06/30/21 2138 07/01/21 0550 07/02/21 0053 07/02/21 1309 07/02/21 1310 07/02/21 1321 07/03/21 0442  HGB 9.5*  --   --   --  10.7*  --    < > 11.9* 11.6* 10.5*  HCT 30.9*  --   --   --  33.1*  --    < > 35.0* 34.0* 33.6*  PLT 201  --   --   --  202  --   --   --   --  282  APTT  --   --   --   --   --   --   --   --   --  52*  HEPARINUNFRC  --   --  0.49 0.46  --   --   --   --   --  0.99*  CREATININE 5.18* 5.36*  --   --  6.44* 5.26*  --   --   --   --   TROPONINIHS 149* 168*  --   --  117*  --   --   --   --   --    < > = values in this interval not displayed.     Estimated Creatinine Clearance: 17.2 mL/min (A) (by C-G formula based on SCr of 5.26 mg/dL (H)).   Medical History: Past Medical History:  Diagnosis Date   Allergy    Phreesia 11/04/2020   Anemia, unspecified    Anxiety    Arthritis    Phreesia 11/04/2020   Asthma    as a child   Cancer ([place])    thyroid   Cellulitis    CHF (congestive heart failure) ([place])    Chiari malformation    s/p surgery   Chiari malformation    Chronic kidney disease    Phreesia 11/04/2020    Chronic kidney disease, stage 5 (HCC)    Chronic obstructive pulmonary disease, unspecified ([place])    Chronic pain    Complication of anesthesia 11/28/15   Resp arrest after  conscious  sedation   Compression of brain (HCC)    Constipation    COPD (chronic obstructive pulmonary disease) ([place])    Phreesia 11/04/2020   Coronary artery disease    40-50% mid LAD 04/29/09, Medical tx. (Dr. Gwenlyn Found)   Diabetes mellitus    Type II- reports being off all medication d/t it being controlled   Diabetes mellitus without complication ([place])    Phreesia 11/04/2020   Essential (primary) hypertension    Fibromyalgia    Fibromyalgia    History of blood transfusion    hemorrage duinrg pregancy   Hx of echocardiogram 10/2011  EF 55-60%   Hypercholesterolemia    Hyperlipidemia, unspecified    Hypertension    Hypertensive chronic kidney disease with stage 1 through stage 4 chronic kidney disease, or unspecified chronic kidney disease    Hypertensive chronic kidney disease with stage 5 chronic kidney disease or end stage renal disease (HCC)    Lymph edema    Lymphedema, not elsewhere classified    Morbid (severe) obesity due to excess calories (HCC)    Obesity hypoventilation syndrome ([place])    On home oxygen therapy    "2L; 24/7" (12/21/2018)   Peritonitis, unspecified ([place])    Pneumonia    in past   Pulmonary hypertension ([place])    Renal disorder    M/W/F [facility] Pt started dialysis in Dec.2016   S/P colonoscopy 05/26/2007   Dr. Laural Golden sigmoid diverticulosis random biopsies benign   S/P endoscopy 05/01/2009   Dr. Penelope Coop pill-induced esophageal ulcerations distal to midesophagus, 2 small ulcers in the antrum of the stomach   Shortness of breath dyspnea    with any exertion or if heart rate  is irregular while on dialysis   Sleep apnea    reports that she no longer needs CPAP due to weight loss   Sleep apnea, unspecified    Type 2 diabetes mellitus with diabetic nephropathy (HCC)    Type  2 diabetes mellitus with hyperglycemia (HCC)     Medications:  Medications Prior to Admission  Medication Sig Dispense Refill Last Dose   acetaminophen (TYLENOL) 325 MG tablet Take 2 tablets (650 mg total) by mouth every 4 (four) hours as needed for mild pain (or Fever >/= 101). 15 tablet 1 PRN   calcium acetate (PHOSLO) 667 MG capsule Take 1,334 mg by mouth 3 (three) times daily.   06/21/2021   calcium carbonate (TUMS - DOSED IN MG ELEMENTAL CALCIUM) 500 MG chewable tablet Chew 2 tablets by mouth 2 (two) times daily.   06/21/2021   cephALEXin (KEFLEX) 500 MG capsule Take 1 capsule (500 mg total) by mouth 2 (two) times daily. Try to take this every 12 hours. On dialysis days, take the first dose after dialysis and the second at bedtime. 10 capsule 0 06/21/2021   ibuprofen (ADVIL) 200 MG tablet Take 400 mg by mouth every 6 (six) hours as needed for moderate pain.   Past Week   lanthanum (FOSRENOL) 500 MG chewable tablet Chew 1,000-1,500 mg by mouth 5 (five) times daily. 1500 mg by mouth three times a day with meals and 1000 mg by mouth twice a day with snacks   06/21/2021   loperamide (IMODIUM) 2 MG capsule Take 2-4 mg by mouth as needed for diarrhea or loose stools.    PRN   multivitamin (RENA-VIT) TABS tablet Take 1 tablet by mouth daily.  11 06/21/2021   Oxycodone HCl 10 MG TABS Take 1 tablet (10 mg total) by mouth 4 (four) times daily as needed (pain). 10 tablet 0 06/21/2021   Sennosides (EX-LAX PO) Take 2 tablets by mouth daily as needed (constipation).   PRN   clindamycin (CLEOCIN) 300 MG capsule Take 1 capsule (300 mg total) by mouth 3 (three) times daily. 16 capsule 0    lactulose (CHRONULAC) 10 GM/15ML solution Take 45 mLs (30 g total) by mouth 2 (two) times daily. For constipation (Patient not taking: Reported on 06/21/2021) 236 mL 0 Not Taking    Assessment: 55 yo female on heparin until PE ruled out (Chest CTA planned).  She was not on anticoagulation  PTA.   Heparin switched to Eliquis yesterday,  then discontinued for cath this AM.  Now s/p RHC, pharmacy asked to resume IV heparin 6 hrs after cath.  Will need DCCV tomorrow, then procedure for fistula at some point.  Received one dose 7/13 AM which may falsely elevate heparin level.  Monitor based on aPTT.  7/15 AM update:  aPTT low  Goal of Therapy:  Heparin level 0.3-0.7 units/ml Monitor platelets by anticoagulation protocol: Yes   Plan:  -Inc heparin to 1700 units/hr -1400 aPTT/HL -F/u plans for oral anticoagulation   Narda Bonds, PharmD, BCPS Clinical Pharmacist Phone: [phone]

## 2021-07-03 NOTE — Plan of Care (Signed)

## 2021-07-03 NOTE — Progress Notes (Signed)
Critical care attending attestation note:  Patient seen and examined and relevant ancillary tests reviewed.  I agree with the assessment and plan of care as outlined by Cherlynn Kaiser, NP.   Synopsis of assessment and plan:  55 year old woman who is critically ill due to cardiogenic shock from decompensated RV failure from [facility] related to OSA (WHO group III) requiring titration of phenylephrine and CRRT for associated ESRD.   Tolerated cardioversion to sinus rhythm - remains on amiodarone infusion.   Will start on CPAP - ultimately will be crucial to ensure compliance if progression of PAH is to be prevented.   CRITICAL CARE Performed by: Kipp Brood   Total critical care time: 35 minutes  Critical care time was exclusive of separately billable procedures and treating other patients.  Critical care was necessary to treat or prevent imminent or life-threatening deterioration.  Critical care was time spent personally by me on the following activities: development of treatment plan with patient and/or surrogate as well as nursing, discussions with consultants, evaluation of patient's response to treatment, examination of patient, obtaining history from patient or surrogate, ordering and performing treatments and interventions, ordering and review of laboratory studies, ordering and review of radiographic studies, pulse oximetry, re-evaluation of patient's condition and participation in multidisciplinary rounds.  Kipp Brood, MD [facility] ICU Physician [place]  Pager: [phone] Mobile: [phone] After hours: [phone].  07/03/2021, 5:35 PM

## 2021-07-03 NOTE — Transfer of Care (Signed)
Immediate Anesthesia Transfer of Care Note  Patient: [name]  Procedure(s) Performed: TRANSESOPHAGEAL ECHOCARDIOGRAM (TEE) CARDIOVERSION  Patient Location: PACU and Endoscopy Unit  Anesthesia Type:General  Level of Consciousness: drowsy, patient cooperative and responds to stimulation  Airway & Oxygen Therapy: Patient Spontanous Breathing and Patient connected to nasal cannula oxygen  Post-op Assessment: Report given to RN and Post -op Vital signs reviewed and stable  Post vital signs: Reviewed and stable  Last Vitals:  Vitals Value Taken Time  BP 124/69 07/03/21 1214  Temp    Pulse 70 07/03/21 1214  Resp 23 07/03/21 1214  SpO2 98 % 07/03/21 1214  Vitals shown include unvalidated device data.  Last Pain:  Vitals:   07/03/21 1110  TempSrc: Oral  PainSc: 6       Patients Stated Pain Goal: 3 (82/50/03 7048)  Complications: No notable events documented.

## 2021-07-03 NOTE — TOC Progression Note (Signed)
Transition of Care [facility]) - Progression Note    Patient Details  Name: VIHANA KYDD MRN: [id number] Date of Birth: Feb 27, 1966  Transition of Care [facility]) CM/SW [place], [place] Phone Number: 07/03/2021, 2:58 PM  Clinical Narrative:     CSW called and left message for Debbie with [place]. CSW awaiting callback to confirm SNF bed for patient.  CSW will continue to follow and assist with dc planning needs.   Expected Discharge Plan: Skilled Nursing Facility Barriers to Discharge: Continued Medical Work up, Ship broker  Expected Discharge Plan and Services Expected Discharge Plan: [place] In-house Referral: Clinical Social Work   Post Acute Care Choice: [place] Living arrangements for the past 2 months: Single Family Home                                       Social Determinants of Health (SDOH) Interventions Food Insecurity Interventions: Intervention Not Indicated Financial Strain Interventions: Intervention Not Indicated Housing Interventions: Intervention Not Indicated Transportation Interventions: Intervention Not Indicated  Readmission Risk Interventions Readmission Risk Prevention Plan 04/30/2019  Transportation Screening Complete  Medication Review Press photographer) Complete  PCP or Specialist appointment within 3-5 days of discharge Complete  HRI or [place] Complete  SW Recovery Care/Counseling Consult Complete  [place] Not Applicable  Some recent data might be hidden

## 2021-07-03 NOTE — H&P (View-Only) (Signed)
Patient ID: [name], female   DOB: June 14, 1966, 55 y.o.   MRN: [id number]      Advanced Heart Failure Rounding Note  PCP-Cardiologist: Dr. Ross/ Dr. Aundra Dubin    Patient Profile:    55 y/o female w/ chronic diastolic CHF with prominent RV failure, pulmonary HTN, ESRD on HD admitted w/ septic shock 2/2 LE cellulitis. Septic shock resolved. Now off NE. Completed abx.   Pt seen in consultation by Dr. Aundra Dubin earlier this admit (see notes for full details). AHF team asked to revisit given issues w/ hypotension limiting HD. Was transferred back to ICU 7/12 for profound hypotension and tachycardia w/ HD and started on phenylephrine. PE has been ruled out. AFL captured on tele. Thought to be high output PH primarily in setting of excessive flow through the large left arm AVF. ?Need for fistula ligation. We are asked to assist w/ RHC w/ fistual occlusion to help better assess.   Subjective:    Patient is back on phenylephrine overnight, rate 120s in atypical atrial flutter.  She is on heparin gtt and amiodarone gtt.  She is on midodrine 15 mg tid.   Due for dialysis today.   Complains of back and leg pain.  She is not very mobile, but was using a scooter for mobility at home prior to admission.   RHC done 7/14 with numbers obtained before and after occluding the left arm AVF with BP cuff inflation (heparin bolus given).   RA 22 RV 65/31 PA 68/37, mean 55 PCWP mean 14 Oxygen saturations: PA 62% AO 95% Cardiac Output (Fick) 4.9  Cardiac Index (Fick) 2.09 PVR 8.3 WU Cardiac Output (Thermo) 6.85 Cardiac Index (Thermo) 2.81 PVR 6.0 WU  AFTER OCCLUSION OF AV FISTULA:  PA 66/41, mean 53 Cardiac Output (Thermo) 7.05   Objective:   Weight Range: (!) 141 kg Body mass index is 51.73 kg/m.   Vital Signs:   Temp:  [97.5 F (36.4 C)-97.9 F (36.6 C)] 97.7 F (36.5 C) (07/15 0630) Pulse Rate:  [121-131] 122 (07/15 0830) Resp:  [14-27] 21 (07/15 0830) BP: (61-123)/(20-82) 100/51  (07/15 0830) SpO2:  [88 %-99 %] 96 % (07/15 0830) Weight:  [137 kg-141 kg] 141 kg (07/15 0500) Last BM Date: 07/01/21  Weight change: Filed Weights   07/02/21 0500 07/02/21 1100 07/03/21 0500  Weight: 135 kg (!) 137 kg (!) 141 kg    Intake/Output:   Intake/Output Summary (Last 24 hours) at 07/03/2021 0936 Last data filed at 07/03/2021 0700 Gross per 24 hour  Intake 816.88 ml  Output --  Net 816.88 ml      Physical Exam    General: NAD Neck: JVP 16 cm, no thyromegaly or thyroid nodule.  Lungs: Clear to auscultation bilaterally with normal respiratory effort. CV: Nonpalpable PMI.  Heart tachy, regular S1/S2, no S3/S4, no murmur.  No peripheral edema.   Abdomen: Soft, nontender, no hepatosplenomegaly, no distention.  Skin: Intact without lesions or rashes.  Neurologic: Alert and oriented x 3.  Psych: Normal affect. Extremities: No clubbing or cyanosis.  HEENT: Normal.    Telemetry   Aflutter 120s (personally reviewed)   EKG    N/A  Labs    CBC Recent Labs    07/01/21 0550 07/02/21 1309 07/02/21 1321 07/03/21 0442  WBC 5.1  --   --  11.0*  HGB 10.7*   < > 11.6* 10.5*  HCT 33.1*   < > 34.0* 33.6*  MCV 98.8  --   --  99.7  PLT 202  --   --  282   < > = values in this interval not displayed.   Basic Metabolic Panel Recent Labs    07/01/21 0550 07/02/21 0053 07/02/21 1309 07/02/21 1310 07/02/21 1321  NA 133* 133*   < > 135 135  K 5.0 5.3*   < > 4.2 4.0  CL 97* 96*  --   --   --   CO2 24 24  --   --   --   GLUCOSE 207* 197*  --   --   --   BUN 28* 27*  --   --   --   CREATININE 6.44* 5.26*  --   --   --   CALCIUM 7.9* 8.2*  --   --   --    < > = values in this interval not displayed.   Liver Function Tests No results for input(s): AST, ALT, ALKPHOS, BILITOT, PROT, ALBUMIN in the last 72 hours.  No results for input(s): LIPASE, AMYLASE in the last 72 hours. Cardiac Enzymes No results for input(s): CKTOTAL, CKMB, CKMBINDEX, TROPONINI in the last  72 hours.   BNP: BNP (last 3 results) Recent Labs    06/22/21 1400 06/29/21 1534 07/01/21 0550  BNP 590.8* 718.9* 2,062.9*    ProBNP (last 3 results) No results for input(s): PROBNP in the last 8760 hours.   D-Dimer No results for input(s): DDIMER in the last 72 hours.  Hemoglobin A1C No results for input(s): HGBA1C in the last 72 hours.  Fasting Lipid Panel No results for input(s): CHOL, HDL, LDLCALC, TRIG, CHOLHDL, LDLDIRECT in the last 72 hours. Thyroid Function Tests No results for input(s): TSH, T4TOTAL, T3FREE, THYROIDAB in the last 72 hours.  Invalid input(s): FREET3   Other results:   Imaging    CARDIAC CATHETERIZATION  Result Date: 07/02/2021 1. Markedly elevated RV filling pressure. 2. Pulmonary arterial hypertension. 3. Normal PCWP 4. Normal cardiac output. The PA pressure and cardiac output did not change with occlusion of AV fistula x 5 minutes.     Medications:     Scheduled Medications:  aspirin  81 mg Oral Daily   calcium acetate  667 mg Oral TID WC   Chlorhexidine Gluconate Cloth  6 each Topical Daily   insulin aspart  0-15 Units Subcutaneous TID WC   insulin aspart  0-5 Units Subcutaneous QHS   mouth rinse  15 mL Mouth Rinse BID   midodrine  15 mg Oral TID WC   sildenafil  20 mg Oral TID   sodium chloride flush  3 mL Intravenous Q12H    Infusions:  sodium chloride     sodium chloride     sodium chloride Stopped (06/30/21 0930)   sodium chloride     amiodarone 30 mg/hr (07/03/21 0700)   heparin 1,700 Units/hr (07/03/21 0700)   phenylephrine ([name]-SYNEPHRINE) Adult infusion 50 mcg/min (07/03/21 0602)   promethazine (PHENERGAN) injection (IM or IVPB) Stopped (07/02/21 1831)    PRN Medications: acetaminophen, diphenhydrAMINE, docusate sodium, fentaNYL (SUBLIMAZE) injection, lactulose, ondansetron (ZOFRAN) IV, oxyCODONE, polyethylene glycol, promethazine (PHENERGAN) injection (IM or IVPB)    Assessment/Plan   1. Acute on chronic  diastolic CHF with prominent RV failure: Echo as above with EF 60-65%, moderate LVH, mod-severe RV dysfunction with severe RVE, PASP 69, mod-severe TR, dilated IVC. This is very similar to prior echo from 2018 with RV looking mildly worse.  She is on phenylephrine 50 again this morning, also midodrine 15 tid. RV failure  with severe pulmonary hypertension, RA pressure 22 on RHC on 7/14.  - Would favor dialysis today and again tomorrow for volume overload.  May need to use phenylephrine to allow significant volume removal.  2. Pulmonary hypertension: RHC in 8/18 showed severe pulmonary hypertension with relatively low PVR (3.0 WU) and high cardiac output. This was thought to be high output PH primarily in setting of excessive flow through the large left arm AVF. She was not thought to be a candidate for selective pulmonary vasodilators.  PA systolic pressure by echo is similar this admission (though mildly lower) to the echo from 2018.  Workup for PE was negative this admission. RHC this admission showed pulmonary arterial hypertension, cardiac output not significantly elevated.  Occluding the AV fistula for 5 minutes showed no change in cardiac output or PA pressure.  The study was not consistent with high output HF with pulmonary hypertension from large AV fistula.  It is probable that her PAH is from OHS/OSA.  - Trial of sildenafil 20 mg tid to see if this seems to help any.  - Per discussion with CCM, despite negative RHC occlusion study, plan is still going to be to ligate AV fistula eventually.  However, not sure of benefit at this point.  Will need to discuss again with CCM and renal.  - Suspect underlying OSA. Reports h/o snoring. Will need outpatient sleep study.  3. Shock: Suspect primarily septic shock from cellulitis initially but RV failure is likely the primary etiology now. She is back on phenylephrine this morning.  - Continue midodrine 10 mg tid. - Will probably need phenylephrine for HD, try  to titrate off when she is back in NSR.  - Long-term concerning prognosis if we are not able to remove fluid via dialysis without pressor as she has significant RV failure.  4. ID: Cellulitis.  Completed abx course w/ daptomycin 5. Aflutter w/ RVR: Atypical, HR 120s.  - Continue amiodarone 30 mg/hr.  - Continue heparin gtt, eventually to Eliquis when long-term plans are fully elucidated.  - TEE-guided DCCV today, discussed risks/benefits with patient and she agrees to procedure.  6. Renal: ESRD.   - HD per nephrology, as above needs significant fluid off.  7. Dysphagia  - SLP consult for swallow eval    CRITICAL CARE Performed by: Loralie Champagne  Total critical care time: 35 minutes  Critical care time was exclusive of separately billable procedures and treating other patients.  Critical care was necessary to treat or prevent imminent or life-threatening deterioration.  Critical care was time spent personally by me on the following activities: development of treatment plan with patient and/or surrogate as well as nursing, discussions with consultants, evaluation of patient's response to treatment, examination of patient, obtaining history from patient or surrogate, ordering and performing treatments and interventions, ordering and review of laboratory studies, ordering and review of radiographic studies, pulse oximetry and re-evaluation of patient's condition.  Loralie Champagne 07/03/2021 9:36 AM

## 2021-07-03 NOTE — Progress Notes (Addendum)
Patient ID: [name], female   DOB: June 14, 1966, 55 y.o.   MRN: [id number]      Advanced Heart Failure Rounding Note  PCP-Cardiologist: Dr. Ross/ Dr. Aundra Dubin    Patient Profile:    55 y/o female w/ chronic diastolic CHF with prominent RV failure, pulmonary HTN, ESRD on HD admitted w/ septic shock 2/2 LE cellulitis. Septic shock resolved. Now off NE. Completed abx.   Pt seen in consultation by Dr. Aundra Dubin earlier this admit (see notes for full details). AHF team asked to revisit given issues w/ hypotension limiting HD. Was transferred back to ICU 7/12 for profound hypotension and tachycardia w/ HD and started on phenylephrine. PE has been ruled out. AFL captured on tele. Thought to be high output PH primarily in setting of excessive flow through the large left arm AVF. ?Need for fistula ligation. We are asked to assist w/ RHC w/ fistual occlusion to help better assess.   Subjective:    Patient is back on phenylephrine overnight, rate 120s in atypical atrial flutter.  She is on heparin gtt and amiodarone gtt.  She is on midodrine 15 mg tid.   Due for dialysis today.   Complains of back and leg pain.  She is not very mobile, but was using a scooter for mobility at home prior to admission.   RHC done 7/14 with numbers obtained before and after occluding the left arm AVF with BP cuff inflation (heparin bolus given).   RA 22 RV 65/31 PA 68/37, mean 55 PCWP mean 14 Oxygen saturations: PA 62% AO 95% Cardiac Output (Fick) 4.9  Cardiac Index (Fick) 2.09 PVR 8.3 WU Cardiac Output (Thermo) 6.85 Cardiac Index (Thermo) 2.81 PVR 6.0 WU  AFTER OCCLUSION OF AV FISTULA:  PA 66/41, mean 53 Cardiac Output (Thermo) 7.05   Objective:   Weight Range: (!) 141 kg Body mass index is 51.73 kg/m.   Vital Signs:   Temp:  [97.5 F (36.4 C)-97.9 F (36.6 C)] 97.7 F (36.5 C) (07/15 0630) Pulse Rate:  [121-131] 122 (07/15 0830) Resp:  [14-27] 21 (07/15 0830) BP: (61-123)/(20-82) 100/51  (07/15 0830) SpO2:  [88 %-99 %] 96 % (07/15 0830) Weight:  [137 kg-141 kg] 141 kg (07/15 0500) Last BM Date: 07/01/21  Weight change: Filed Weights   07/02/21 0500 07/02/21 1100 07/03/21 0500  Weight: 135 kg (!) 137 kg (!) 141 kg    Intake/Output:   Intake/Output Summary (Last 24 hours) at 07/03/2021 0936 Last data filed at 07/03/2021 0700 Gross per 24 hour  Intake 816.88 ml  Output --  Net 816.88 ml      Physical Exam    General: NAD Neck: JVP 16 cm, no thyromegaly or thyroid nodule.  Lungs: Clear to auscultation bilaterally with normal respiratory effort. CV: Nonpalpable PMI.  Heart tachy, regular S1/S2, no S3/S4, no murmur.  No peripheral edema.   Abdomen: Soft, nontender, no hepatosplenomegaly, no distention.  Skin: Intact without lesions or rashes.  Neurologic: Alert and oriented x 3.  Psych: Normal affect. Extremities: No clubbing or cyanosis.  HEENT: Normal.    Telemetry   Aflutter 120s (personally reviewed)   EKG    N/A  Labs    CBC Recent Labs    07/01/21 0550 07/02/21 1309 07/02/21 1321 07/03/21 0442  WBC 5.1  --   --  11.0*  HGB 10.7*   < > 11.6* 10.5*  HCT 33.1*   < > 34.0* 33.6*  MCV 98.8  --   --  99.7  PLT 202  --   --  282   < > = values in this interval not displayed.   Basic Metabolic Panel Recent Labs    07/01/21 0550 07/02/21 0053 07/02/21 1309 07/02/21 1310 07/02/21 1321  NA 133* 133*   < > 135 135  K 5.0 5.3*   < > 4.2 4.0  CL 97* 96*  --   --   --   CO2 24 24  --   --   --   GLUCOSE 207* 197*  --   --   --   BUN 28* 27*  --   --   --   CREATININE 6.44* 5.26*  --   --   --   CALCIUM 7.9* 8.2*  --   --   --    < > = values in this interval not displayed.   Liver Function Tests No results for input(s): AST, ALT, ALKPHOS, BILITOT, PROT, ALBUMIN in the last 72 hours.  No results for input(s): LIPASE, AMYLASE in the last 72 hours. Cardiac Enzymes No results for input(s): CKTOTAL, CKMB, CKMBINDEX, TROPONINI in the last  72 hours.   BNP: BNP (last 3 results) Recent Labs    06/22/21 1400 06/29/21 1534 07/01/21 0550  BNP 590.8* 718.9* 2,062.9*    ProBNP (last 3 results) No results for input(s): PROBNP in the last 8760 hours.   D-Dimer No results for input(s): DDIMER in the last 72 hours.  Hemoglobin A1C No results for input(s): HGBA1C in the last 72 hours.  Fasting Lipid Panel No results for input(s): CHOL, HDL, LDLCALC, TRIG, CHOLHDL, LDLDIRECT in the last 72 hours. Thyroid Function Tests No results for input(s): TSH, T4TOTAL, T3FREE, THYROIDAB in the last 72 hours.  Invalid input(s): FREET3   Other results:   Imaging    CARDIAC CATHETERIZATION  Result Date: 07/02/2021 1. Markedly elevated RV filling pressure. 2. Pulmonary arterial hypertension. 3. Normal PCWP 4. Normal cardiac output. The PA pressure and cardiac output did not change with occlusion of AV fistula x 5 minutes.     Medications:     Scheduled Medications:  aspirin  81 mg Oral Daily   calcium acetate  667 mg Oral TID WC   Chlorhexidine Gluconate Cloth  6 each Topical Daily   insulin aspart  0-15 Units Subcutaneous TID WC   insulin aspart  0-5 Units Subcutaneous QHS   mouth rinse  15 mL Mouth Rinse BID   midodrine  15 mg Oral TID WC   sildenafil  20 mg Oral TID   sodium chloride flush  3 mL Intravenous Q12H    Infusions:  sodium chloride     sodium chloride     sodium chloride Stopped (06/30/21 0930)   sodium chloride     amiodarone 30 mg/hr (07/03/21 0700)   heparin 1,700 Units/hr (07/03/21 0700)   phenylephrine ([name]-SYNEPHRINE) Adult infusion 50 mcg/min (07/03/21 0602)   promethazine (PHENERGAN) injection (IM or IVPB) Stopped (07/02/21 1831)    PRN Medications: acetaminophen, diphenhydrAMINE, docusate sodium, fentaNYL (SUBLIMAZE) injection, lactulose, ondansetron (ZOFRAN) IV, oxyCODONE, polyethylene glycol, promethazine (PHENERGAN) injection (IM or IVPB)    Assessment/Plan   1. Acute on chronic  diastolic CHF with prominent RV failure: Echo as above with EF 60-65%, moderate LVH, mod-severe RV dysfunction with severe RVE, PASP 69, mod-severe TR, dilated IVC. This is very similar to prior echo from 2018 with RV looking mildly worse.  She is on phenylephrine 50 again this morning, also midodrine 15 tid. RV failure  with severe pulmonary hypertension, RA pressure 22 on RHC on 7/14.  - Would favor dialysis today and again tomorrow for volume overload.  May need to use phenylephrine to allow significant volume removal.  2. Pulmonary hypertension: RHC in 8/18 showed severe pulmonary hypertension with relatively low PVR (3.0 WU) and high cardiac output. This was thought to be high output PH primarily in setting of excessive flow through the large left arm AVF. She was not thought to be a candidate for selective pulmonary vasodilators.  PA systolic pressure by echo is similar this admission (though mildly lower) to the echo from 2018.  Workup for PE was negative this admission. RHC this admission showed pulmonary arterial hypertension, cardiac output not significantly elevated.  Occluding the AV fistula for 5 minutes showed no change in cardiac output or PA pressure.  The study was not consistent with high output HF with pulmonary hypertension from large AV fistula.  It is probable that her PAH is from OHS/OSA.  - Trial of sildenafil 20 mg tid to see if this seems to help any.  - Per discussion with CCM, despite negative RHC occlusion study, plan is still going to be to ligate AV fistula eventually.  However, not sure of benefit at this point.  Will need to discuss again with CCM and renal.  - Suspect underlying OSA. Reports h/o snoring. Will need outpatient sleep study.  3. Shock: Suspect primarily septic shock from cellulitis initially but RV failure is likely the primary etiology now. She is back on phenylephrine this morning.  - Continue midodrine 10 mg tid. - Will probably need phenylephrine for HD, try  to titrate off when she is back in NSR.  - Long-term concerning prognosis if we are not able to remove fluid via dialysis without pressor as she has significant RV failure.  4. ID: Cellulitis.  Completed abx course w/ daptomycin 5. Aflutter w/ RVR: Atypical, HR 120s.  - Continue amiodarone 30 mg/hr.  - Continue heparin gtt, eventually to Eliquis when long-term plans are fully elucidated.  - TEE-guided DCCV today, discussed risks/benefits with patient and she agrees to procedure.  6. Renal: ESRD.   - HD per nephrology, as above needs significant fluid off.  7. Dysphagia  - SLP consult for swallow eval    CRITICAL CARE Performed by: Loralie Champagne  Total critical care time: 35 minutes  Critical care time was exclusive of separately billable procedures and treating other patients.  Critical care was necessary to treat or prevent imminent or life-threatening deterioration.  Critical care was time spent personally by me on the following activities: development of treatment plan with patient and/or surrogate as well as nursing, discussions with consultants, evaluation of patient's response to treatment, examination of patient, obtaining history from patient or surrogate, ordering and performing treatments and interventions, ordering and review of laboratory studies, ordering and review of radiographic studies, pulse oximetry and re-evaluation of patient's condition.  Loralie Champagne 07/03/2021 9:36 AM

## 2021-07-03 NOTE — Progress Notes (Signed)
  Echocardiogram Echocardiogram Transesophageal has been performed.  Johny Chess 07/03/2021, 12:24 PM

## 2021-07-03 NOTE — Progress Notes (Addendum)
NAME:  [name], MRN:  [id number], DOB:  1966-06-30, LOS: 12 ADMISSION DATE:  06/21/2021, CONSULTATION DATE:  06/21/21  REFERRING MD:  Melina Copa, ED, CHIEF COMPLAINT:  lethargy    History of Present Illness:  [name] is a 55 year old woman with hx of obesity, lymphedema, ESRD on HD MWF, CHF, recently hospitalized at [facility] for cellulitis, discharged 7/3, returning back same day with lethargy, hypotension and elevated lactic acid.  She reported her family was worried about her after d/c because she was sleeping a lot, called to have her brought back to hospital.  No change in pain /tenderness to RLE.   In ED: LR bolus 1.8, doxycycline, cefepime, flagyl given. Admitted for evaluation of hypotension.  She transferred out to the hospitalist service on 7/5.  PCCM called back to see patient for hypotension and tachycardia on 7/12. PE ruled out. Found to have AF/flutter, acute on chronic diastolic CHF with RV failure.  Concern for sepsis resolved but remaining shock thought secondary to RV failure.  Pertinent  Medical History  Anemia Thyroid cancer CHF Chiari malformation ESRD on HD M/W/F COPD?  CAD HLD  HTN DM Lymphedema  Significant Hospital Events: Including procedures, antibiotic start and stop dates in addition to other pertinent events   7/3 admitted to ICU on vasopressors, tachyarrhythmias overnight. Switched from NE> neosynephrine. On heparin. 7/4 Echo with acute on subacute R heart failure, LVH with no RWMA. Trop 3500.  7/5 transferred from ICU to hospitalist service 7/12 called back to see pt re: hypotension (normal lactate) and tachycardia 7/14 BP down overnight, in [place].  RHC with fistula occlusion   Interim History / Subjective:  Planned cardioverson 7/15  Afebrile  Pt reports her legs feel tight and her back is hurting from lying in bed - she just received fentanyl and feels this is helping   Objective   Blood pressure (!) 100/51, pulse (!) 122, temperature 97.7 F (36.5  C), temperature source Oral, resp. rate (!) 21, height _0  (1.651 m), weight (!) 141 kg, SpO2 96 %.        Intake/Output Summary (Last 24 hours) at 07/03/2021 0940 Last data filed at 07/03/2021 0700 Gross per 24 hour  Intake 816.88 ml  Output --  Net 816.88 ml   Filed Weights   07/02/21 0500 07/02/21 1100 07/03/21 0500  Weight: 135 kg (!) 137 kg (!) 141 kg    Examination: General: chronically ill appearing adult female lying in bed in NAD HEENT: MM pink/dry, anicteric, JVP+ Neuro:  AAOx4, speech clear, MAE CV: s1s2 RRR, no m/r/g, ST on monitor PULM:  non-labored on [place] O2, lungs bilaterally clear GI: soft, bsx4 active  Extremities: warm/dry, changes LE's consistent with venous stasis, difficult to discern edema due to body habitus (pt indicates her thighs "feel tight") Skin: no rashes or lesions  Resolved Hospital Problem list   Sepsis - completed course daptomycin for cellulitis  RLE Cellulitis   Assessment & Plan:   Shock with Complex RV failure  Intermittent Atypical aflutter Pulmonary Hypertension, suspected Group II  ESRD Suspected OSA/OHS Dysphagia   Initially admitted with concerns for sepsis from RLE Cellulitis in an ESRD patient with OSA, obesity, and large fistula.  Seem to be reaching a plateau in getting fluid off due to BP issues despite midodrine and albumin.  She underwent RHC with fistula ligation / occlusion 7/14 which did not show change in PA pressure or C.O. with occlusion of fistula for 5 minutes.  She  did have markedly elevated RV filling pressures, PAH, normal PCWP and normal C.O.   -appreciate Advanced Heart Failure Team  -not a candidate for selective pulmonary vasodilators  -continue sildenafil -continue midodrine  -continue neo for MAP > 65 for now to allow for HD  -continue heparin gtt  -continue amiodarone infusion  -will need oral anticoagulation once all procedures completed  -plan is for cardioversion 7/15  -volume removal per  Nephrology with iHD  -will need outpatient sleep study  -spoke with Nephrology 7/15 > there can be some benefit from long term occlusion if suspected high flow fistula (may not see acutely with occlusion).  However, currently do not feel this is a high output fistula but could consider fistula occlusion and tunneled catheter insertion (with presumption that it is high output) in the future.   -concern for long term prognosis given limited ability to pull fluid with HD, ongoing pressor needs and RV failure    Best Practice (right click and "Reselect all SmartList Selections" daily)  Diet/type: Regular consistency (see orders) DVT prophylaxis: systemic heparin GI prophylaxis: PPI Lines: N/A Foley:  N/A   Code Status: Full Code  Critical Care Time: 66 minutes   Lindsay Gens, MSN, APRN, NP-C, AGACNP-BC [facility] 07/03/2021, 9:41 AM   Please see [email or web address] for pager details.   From 7A-7P if no response, please call [phone] After hours, please call ELink [phone]

## 2021-07-04 DIAGNOSIS — I950 Idiopathic hypotension: Secondary | ICD-10-CM | POA: Diagnosis not present

## 2021-07-04 DIAGNOSIS — A419 Sepsis, unspecified organism: Secondary | ICD-10-CM | POA: Diagnosis not present

## 2021-07-04 DIAGNOSIS — R6521 Severe sepsis with septic shock: Secondary | ICD-10-CM | POA: Diagnosis not present

## 2021-07-04 LAB — GLUCOSE, CAPILLARY
Glucose-Capillary: 100 mg/dL — ABNORMAL HIGH (ref 70–99)
Glucose-Capillary: 175 mg/dL — ABNORMAL HIGH (ref 70–99)
Glucose-Capillary: 164 mg/dL — ABNORMAL HIGH (ref 70–99)
Glucose-Capillary: 138 mg/dL — ABNORMAL HIGH (ref 70–99)

## 2021-07-04 LAB — BASIC METABOLIC PANEL
Anion gap: 18 — ABNORMAL HIGH (ref 5–15)
BUN: 28 mg/dL — ABNORMAL HIGH (ref 6–20)
CO2: 21 mmol/L — ABNORMAL LOW (ref 22–32)
Calcium: 8.6 mg/dL — ABNORMAL LOW (ref 8.9–10.3)
Chloride: 96 mmol/L — ABNORMAL LOW (ref 98–111)
Creatinine, Ser: 5.16 mg/dL — ABNORMAL HIGH (ref 0.44–1.00)
GFR, Estimated: 9 mL/min — ABNORMAL LOW (ref >60)
Glucose, Bld: 100 mg/dL — ABNORMAL HIGH (ref 70–99)
Potassium: 5 mmol/L (ref 3.5–5.1)
Sodium: 135 mmol/L (ref 135–145)

## 2021-07-04 LAB — CBC
HCT: 35.3 % — ABNORMAL LOW (ref 36.0–46.0)
Hemoglobin: 11.2 g/dL — ABNORMAL LOW (ref 12.0–15.0)
MCH: 32.1 pg (ref 26.0–34.0)
MCHC: 31.7 g/dL (ref 30.0–36.0)
MCV: 101.1 fL — ABNORMAL HIGH (ref 80.0–100.0)
Platelets: 258 10*3/uL (ref 150–400)
RBC: 3.49 MIL/uL — ABNORMAL LOW (ref 3.87–5.11)
RDW: 16.5 % — ABNORMAL HIGH (ref 11.5–15.5)
WBC: 11.5 10*3/uL — ABNORMAL HIGH (ref 4.0–10.5)
nRBC: 3.5 % — ABNORMAL HIGH (ref 0.0–0.2)

## 2021-07-04 LAB — APTT
aPTT: 61 seconds — ABNORMAL HIGH (ref 24–36)
aPTT: 73 seconds — ABNORMAL HIGH (ref 24–36)

## 2021-07-04 LAB — HEPARIN LEVEL (UNFRACTIONATED)
Heparin Unfractionated: 0.87 IU/mL — ABNORMAL HIGH (ref 0.30–0.70)
Heparin Unfractionated: 0.76 IU/mL — ABNORMAL HIGH (ref 0.30–0.70)

## 2021-07-04 MED ORDER — SODIUM ZIRCONIUM CYCLOSILICATE 10 G PO PACK
10.0000 g | PACK | Freq: Once | ORAL | Status: AC
  Administered 2021-07-04: 10 g via ORAL
  Filled 2021-07-04: qty 1

## 2021-07-04 NOTE — Progress Notes (Addendum)
ANTICOAGULATION CONSULT NOTE   Pharmacy Consult for heparin Indication: afib/flutter  Allergies  Allergen Reactions   Contrast Media [Iodinated Diagnostic Agents] Anaphylaxis, Hives, Swelling and Other (See Comments)    Dye for cardiac cath. Tongue swells   Pneumococcal Vaccines Swelling and Other (See Comments)    Turns skin black, and bodily swelling   Vancomycin Nausea And Vomiting and Other (See Comments)    Infusion "made me feel like I was dying" had to be readmitted to hospital    Patient Measurements: Height: _0  (165.1 cm) Weight: (!) 137 kg (302 lb 0.5 oz) IBW/kg (Calculated) : 57 Heparin Dosing Weight:92 kg  Vital Signs: Temp: 97.6 F (36.4 C) (07/15 2000) Temp Source: Oral (07/15 2000) BP: 82/53 (07/15 2300) Pulse Rate: 66 (07/15 2300)  Labs: Recent Labs    07/01/21 0550 07/02/21 0053 07/02/21 1309 07/02/21 1310 07/02/21 1321 07/03/21 0442 07/03/21 2313  HGB 10.7*  --    < > 11.9* 11.6* 10.5*  --   HCT 33.1*  --    < > 35.0* 34.0* 33.6*  --   PLT 202  --   --   --   --  282  --   APTT  --   --   --   --   --  52* 81*  HEPARINUNFRC  --   --   --   --   --  0.99* 0.87*  CREATININE 6.44* 5.26*  --   --   --   --   --   TROPONINIHS 117*  --   --   --   --   --   --    < > = values in this interval not displayed.     Estimated Creatinine Clearance: 17.2 mL/min (A) (by C-G formula based on SCr of 5.26 mg/dL (H)).   Medical History: Past Medical History:  Diagnosis Date   Allergy    Phreesia 11/04/2020   Anemia, unspecified    Anxiety    Arthritis    Phreesia 11/04/2020   Asthma    as a child   Cancer ([place])    thyroid   Cellulitis    CHF (congestive heart failure) ([place])    Chiari malformation    s/p surgery   Chiari malformation    Chronic kidney disease    Phreesia 11/04/2020   Chronic kidney disease, stage 5 (HCC)    Chronic obstructive pulmonary disease, unspecified ([place])    Chronic pain    Complication of anesthesia 11/28/15   Resp  arrest after  conscious  sedation   Compression of brain (HCC)    Constipation    COPD (chronic obstructive pulmonary disease) ([place])    Phreesia 11/04/2020   Coronary artery disease    40-50% mid LAD 04/29/09, Medical tx. (Dr. Gwenlyn Found)   Diabetes mellitus    Type II- reports being off all medication d/t it being controlled   Diabetes mellitus without complication ([place])    Phreesia 11/04/2020   Essential (primary) hypertension    Fibromyalgia    Fibromyalgia    History of blood transfusion    hemorrage duinrg pregancy   Hx of echocardiogram 10/2011   EF 55-60%   Hypercholesterolemia    Hyperlipidemia, unspecified    Hypertension    Hypertensive chronic kidney disease with stage 1 through stage 4 chronic kidney disease, or unspecified chronic kidney disease    Hypertensive chronic kidney disease with stage 5 chronic kidney disease or end stage renal disease ([place])  Lymph edema    Lymphedema, not elsewhere classified    Morbid (severe) obesity due to excess calories (HCC)    Obesity hypoventilation syndrome ([place])    On home oxygen therapy    "2L; 24/7" (12/21/2018)   Peritonitis, unspecified ([place])    Pneumonia    in past   Pulmonary hypertension ([place])    Renal disorder    M/W/F [facility]  Pt started dialysis in Dec.2016   S/P colonoscopy 05/26/2007   Dr. Laural Golden sigmoid diverticulosis random biopsies benign   S/P endoscopy 05/01/2009   Dr. Penelope Coop pill-induced esophageal ulcerations distal to midesophagus, 2 small ulcers in the antrum of the stomach   Shortness of breath dyspnea    with any exertion or if heart rate  is irregular while on dialysis   Sleep apnea    reports that she no longer needs CPAP due to weight loss   Sleep apnea, unspecified    Type 2 diabetes mellitus with diabetic nephropathy ([place])    Type 2 diabetes mellitus with hyperglycemia (HCC)     Medications:  Medications Prior to Admission  Medication Sig Dispense Refill Last Dose   acetaminophen  (TYLENOL) 325 MG tablet Take 2 tablets (650 mg total) by mouth every 4 (four) hours as needed for mild pain (or Fever >/= 101). 15 tablet 1 PRN   calcium acetate (PHOSLO) 667 MG capsule Take 1,334 mg by mouth 3 (three) times daily.   06/21/2021   calcium carbonate (TUMS - DOSED IN MG ELEMENTAL CALCIUM) 500 MG chewable tablet Chew 2 tablets by mouth 2 (two) times daily.   06/21/2021   cephALEXin (KEFLEX) 500 MG capsule Take 1 capsule (500 mg total) by mouth 2 (two) times daily. Try to take this every 12 hours. On dialysis days, take the first dose after dialysis and the second at bedtime. 10 capsule 0 06/21/2021   ibuprofen (ADVIL) 200 MG tablet Take 400 mg by mouth every 6 (six) hours as needed for moderate pain.   Past Week   lanthanum (FOSRENOL) 500 MG chewable tablet Chew 1,000-1,500 mg by mouth 5 (five) times daily. 1500 mg by mouth three times a day with meals and 1000 mg by mouth twice a day with snacks   06/21/2021   loperamide (IMODIUM) 2 MG capsule Take 2-4 mg by mouth as needed for diarrhea or loose stools.    PRN   multivitamin (RENA-VIT) TABS tablet Take 1 tablet by mouth daily.  11 06/21/2021   Oxycodone HCl 10 MG TABS Take 1 tablet (10 mg total) by mouth 4 (four) times daily as needed (pain). 10 tablet 0 06/21/2021   Sennosides (EX-LAX PO) Take 2 tablets by mouth daily as needed (constipation).   PRN   clindamycin (CLEOCIN) 300 MG capsule Take 1 capsule (300 mg total) by mouth 3 (three) times daily. 16 capsule 0    lactulose (CHRONULAC) 10 GM/15ML solution Take 45 mLs (30 g total) by mouth 2 (two) times daily. For constipation (Patient not taking: Reported on 06/21/2021) 236 mL 0 Not Taking    Assessment: 55 yo female on heparin for afib/flutter. PE ruled out.  She was not on anticoagulation PTA.  Heparin level 0.87 (falsely elevated by Eliquis dose on 7/13), PTT 81 sec (therapeutic) on gtt at 1700 units/hr.  Goal of Therapy:  Heparin level 0.3-0.7 units/ml Monitor platelets by anticoagulation  protocol: Yes   Plan:  Continue heparin gtt at 1700 units/hr Daily PTT and heparin level   Sherlon Handing, PharmD, BCPS Please  see amion for complete clinical pharmacist phone list  07/04/2021 12:30 AM   ADDENDUM [id number]): Heparin level 0.76 (affected by Eliquis), PTT now down to subtherapeutic (61 sec) on gtt at 1700 units/hr. No issues with line or bleeding reported per RN.  Plan: Will increase heparin gtt to 1800 units/hr F/u PTT in 8 hours  Sherlon Handing, PharmD, BCPS Please see amion for complete clinical pharmacist phone list 07/04/2021 6:21 AM

## 2021-07-04 NOTE — Progress Notes (Signed)
Patient ID: [name], female   DOB: May 25, 1966, 55 y.o.   MRN: [id number]      Advanced Heart Failure Rounding Note  PCP-Cardiologist: Dr. Ross/ Dr. Aundra Dubin    Patient Profile:    55 y/o female w/ chronic diastolic CHF with prominent RV failure, pulmonary HTN, ESRD on HD admitted w/ septic shock 2/2 LE cellulitis. Septic shock resolved. Now off NE. Completed abx.   Pt seen in consultation by Dr. Aundra Dubin earlier this admit (see notes for full details). AHF team asked to revisit given issues w/ hypotension limiting HD. Was transferred back to ICU 7/12 for profound hypotension and tachycardia w/ HD and started on phenylephrine. PE has been ruled out. AFL captured on tele. Thought to be high output PH primarily in setting of excessive flow through the large left arm AVF. ?Need for fistula ligation. We are asked to assist w/ RHC w/ fistual occlusion to help better assess.   Subjective:    Underwent DC-CV yesterday. Remains in NSR on IV amio. HR mostly 60s but dips into 50s at times   Had HD yesterday with 4L off. Feels better. No dyspnea. Remains on midodrine 15 tid and [name] 30 for BP support (MAPs 60s)   RHC done 7/14 with numbers obtained before and after occluding the left arm AVF with BP cuff inflation (heparin bolus given).   RA 22 RV 65/31 PA 68/37, mean 55 PCWP mean 14 Oxygen saturations: PA 62% AO 95% Cardiac Output (Fick) 4.9  Cardiac Index (Fick) 2.09 PVR 8.3 WU Cardiac Output (Thermo) 6.85 Cardiac Index (Thermo) 2.81 PVR 6.0 WU  AFTER OCCLUSION OF AV FISTULA:  PA 66/41, mean 53 Cardiac Output (Thermo) 7.05   Objective:   Weight Range: (!) 137 kg Body mass index is 50.26 kg/m.   Vital Signs:   Temp:  [97.5 F (36.4 C)-98.4 F (36.9 C)] 97.5 F (36.4 C) (07/16 1100) Pulse Rate:  [56-103] 60 (07/16 1045) Resp:  [9-23] 21 (07/16 1045) BP: (69-125)/(46-101) 123/79 (07/16 1045) SpO2:  [86 %-100 %] 99 % (07/16 1045) Weight:  [137 kg-141 kg] 137 kg (07/15  1857) Last BM Date: 07/01/21  Weight change: Filed Weights   07/03/21 0500 07/03/21 1442 07/03/21 1857  Weight: (!) 141 kg (!) 141 kg (!) 137 kg    Intake/Output:   Intake/Output Summary (Last 24 hours) at 07/04/2021 1207 Last data filed at 07/04/2021 1000 Gross per 24 hour  Intake 1941.98 ml  Output 4000 ml  Net -2058.02 ml       Physical Exam    General:  Sitting in bed No resp difficulty HEENT: normal Neck: supple. no JVD. Carotids 2+ bilat; no bruits. No lymphadenopathy or thryomegaly appreciated. Cor: PMI nondisplaced. Regular rate & rhythm. No rubs, gallops or murmurs. Lungs: clear Abdomen: obese soft, nontender, nondistended. No hepatosplenomegaly. No bruits or masses. Good bowel sounds. Extremities: no cyanosis, clubbing, rash, + lymphedema Neuro: alert & orientedx3, cranial nerves grossly intact. moves all 4 extremities w/o difficulty. Affect pleasant   Telemetry   Sinus 50-60 Personally reviewed   Labs    CBC Recent Labs    07/03/21 0442 07/04/21 0456  WBC 11.0* 11.5*  HGB 10.5* 11.2*  HCT 33.6* 35.3*  MCV 99.7 101.1*  PLT 282 976    Basic Metabolic Panel Recent Labs    07/02/21 0053 07/02/21 1309 07/02/21 1321 07/04/21 0456  NA 133*   < > 135 135  K 5.3*   < > 4.0 5.0  CL 96*  --   --  96*  CO2 24  --   --  21*  GLUCOSE 197*  --   --  100*  BUN 27*  --   --  28*  CREATININE 5.26*  --   --  5.16*  CALCIUM 8.2*  --   --  8.6*   < > = values in this interval not displayed.    Liver Function Tests No results for input(s): AST, ALT, ALKPHOS, BILITOT, PROT, ALBUMIN in the last 72 hours.  No results for input(s): LIPASE, AMYLASE in the last 72 hours. Cardiac Enzymes No results for input(s): CKTOTAL, CKMB, CKMBINDEX, TROPONINI in the last 72 hours.   BNP: BNP (last 3 results) Recent Labs    06/22/21 1400 06/29/21 1534 07/01/21 0550  BNP 590.8* 718.9* 2,062.9*     ProBNP (last 3 results) No results for input(s): PROBNP in the  last 8760 hours.   D-Dimer No results for input(s): DDIMER in the last 72 hours.  Hemoglobin A1C No results for input(s): HGBA1C in the last 72 hours.  Fasting Lipid Panel No results for input(s): CHOL, HDL, LDLCALC, TRIG, CHOLHDL, LDLDIRECT in the last 72 hours. Thyroid Function Tests No results for input(s): TSH, T4TOTAL, T3FREE, THYROIDAB in the last 72 hours.  Invalid input(s): FREET3   Other results:   Imaging    No results found.   Medications:     Scheduled Medications:  aspirin  81 mg Oral Daily   calcium acetate  667 mg Oral TID WC   Chlorhexidine Gluconate Cloth  6 each Topical Daily   insulin aspart  0-15 Units Subcutaneous TID WC   insulin aspart  0-5 Units Subcutaneous QHS   mouth rinse  15 mL Mouth Rinse BID   midodrine  15 mg Oral TID WC   sodium chloride flush  3 mL Intravenous Q12H   sodium zirconium cyclosilicate  10 g Oral Once    Infusions:  sodium chloride     amiodarone 30 mg/hr (07/04/21 1006)   heparin 1,800 Units/hr (07/04/21 1000)   phenylephrine ([name]-SYNEPHRINE) Adult infusion 60 mcg/min (07/04/21 0848)   promethazine (PHENERGAN) injection (IM or IVPB) Stopped (07/02/21 1831)    PRN Medications: acetaminophen, diphenhydrAMINE, docusate sodium, fentaNYL (SUBLIMAZE) injection, lactulose, ondansetron (ZOFRAN) IV, oxyCODONE, polyethylene glycol, promethazine (PHENERGAN) injection (IM or IVPB)    Assessment/Plan   1. Acute on chronic diastolic CHF with prominent RV failure: Echo as above with EF 60-65%, moderate LVH, mod-severe RV dysfunction with severe RVE, PASP 69, mod-severe TR, dilated IVC. This is very similar to prior echo from 2018 with RV looking mildly worse.  She is on phenylephrine 50 again this morning, also midodrine 15 tid. RV failure with severe pulmonary hypertension, RA pressure 22 on RHC on 7/14.  - Volume status much improved after HD yesterday. Renal managing. Discussed at bedside.  2. Pulmonary hypertension: RHC in  8/18 showed severe pulmonary hypertension with relatively low PVR (3.0 WU) and high cardiac output. This was thought to be high output PH primarily in setting of excessive flow through the large left arm AVF. She was not thought to be a candidate for selective pulmonary vasodilators.  PA systolic pressure by echo is similar this admission (though mildly lower) to the echo from 2018.  Workup for PE was negative this admission. RHC this admission showed pulmonary arterial hypertension, cardiac output not significantly elevated.  Occluding the AV fistula for 5 minutes showed no change in cardiac output or PA pressure.  The study was not consistent with high  output HF with pulmonary hypertension from large AV fistula.  It is probable that her PAH is from OHS/OSA.  - Will need to hold sildenafil for now with low BP requiring neo  - Per Dr. Claris Gladden discussion with CCM, despite negative RHC occlusion study, plan is still going to be to ligate/modify AV fistula eventually.  However, not sure of benefit at this point.  - Suspect underlying OSA. Reports h/o snoring. Will need outpatient sleep study.  3. Shock: Suspect primarily septic shock from cellulitis initially but RV failure is likely the primary etiology now. She is back on phenylephrine this morning.  - Continue midodrine 15 mg tid and Neosynephrine - Wean neo as able  - Long-term concerning prognosis if we are not able to remove fluid via dialysis without pressor as she has significant RV failure.  4. ID: Cellulitis.  Completed abx course w/ daptomycin 5. Aflutter w/ RVR: Atypical,  - s/p DC-CV on 7/15. Remains in NSR - Continue amiodarone 30 mg/hr. Today  - Continue heparin gtt, eventually to Eliquis when long-term plans are fully elucidated.  6. Renal: ESRD.   - HD per nephrology. D/w Dr. Johnney Ou at bedside  7. Dysphagia  - SLP consult for swallow eval    CRITICAL CARE Performed by: Glori Bickers  Total critical care time: 35  minutes  Critical care time was exclusive of separately billable procedures and treating other patients.  Critical care was necessary to treat or prevent imminent or life-threatening deterioration.  Critical care was time spent personally by me on the following activities: development of treatment plan with patient and/or surrogate as well as nursing, discussions with consultants, evaluation of patient's response to treatment, examination of patient, obtaining history from patient or surrogate, ordering and performing treatments and interventions, ordering and review of laboratory studies, ordering and review of radiographic studies, pulse oximetry and re-evaluation of patient's condition.  Quillian Quince Herman Mell 07/04/2021 12:07 PM

## 2021-07-04 NOTE — Progress Notes (Signed)
Pt refused CPAP tonight.

## 2021-07-04 NOTE — Progress Notes (Signed)
ANTICOAGULATION CONSULT NOTE   Pharmacy Consult for heparin Indication: afib/flutter  Allergies  Allergen Reactions   Contrast Media [Iodinated Diagnostic Agents] Anaphylaxis, Hives, Swelling and Other (See Comments)    Dye for cardiac cath. Tongue swells   Pneumococcal Vaccines Swelling and Other (See Comments)    Turns skin black, and bodily swelling   Vancomycin Nausea And Vomiting and Other (See Comments)    Infusion "made me feel like I was dying" had to be readmitted to hospital    Patient Measurements: Height: _0  (165.1 cm) Weight: (!) 137 kg (302 lb 0.5 oz) IBW/kg (Calculated) : 57 Heparin Dosing Weight:92 kg  Vital Signs: Temp: 97.5 F (36.4 C) (07/16 1100) Temp Source: Oral (07/16 0700) BP: 115/76 (07/16 1445) Pulse Rate: 59 (07/16 1445)  Labs: Recent Labs    07/02/21 0053 07/02/21 1309 07/02/21 1321 07/02/21 1321 07/03/21 0442 07/03/21 2313 07/04/21 0456 07/04/21 1429  HGB  --    < > 11.6*  --  10.5*  --  11.2*  --   HCT  --    < > 34.0*  --  33.6*  --  35.3*  --   PLT  --   --   --   --  282  --  258  --   APTT  --   --   --    < > 52* 81* 61* 73*  HEPARINUNFRC  --   --   --   --  0.99* 0.87* 0.76*  --   CREATININE 5.26*  --   --   --   --   --  5.16*  --    < > = values in this interval not displayed.     Estimated Creatinine Clearance: 17.5 mL/min (A) (by C-G formula based on SCr of 5.16 mg/dL (H)).   Medical History: Past Medical History:  Diagnosis Date   Allergy    Phreesia 11/04/2020   Anemia, unspecified    Anxiety    Arthritis    Phreesia 11/04/2020   Asthma    as a child   Cancer ([place])    thyroid   Cellulitis    CHF (congestive heart failure) ([place])    Chiari malformation    s/p surgery   Chiari malformation    Chronic kidney disease    Phreesia 11/04/2020   Chronic kidney disease, stage 5 (HCC)    Chronic obstructive pulmonary disease, unspecified ([place])    Chronic pain    Complication of anesthesia 11/28/15   Resp  arrest after  conscious  sedation   Compression of brain (HCC)    Constipation    COPD (chronic obstructive pulmonary disease) ([place])    Phreesia 11/04/2020   Coronary artery disease    40-50% mid LAD 04/29/09, Medical tx. (Dr. Gwenlyn Found)   Diabetes mellitus    Type II- reports being off all medication d/t it being controlled   Diabetes mellitus without complication ([place])    Phreesia 11/04/2020   Essential (primary) hypertension    Fibromyalgia    Fibromyalgia    History of blood transfusion    hemorrage duinrg pregancy   Hx of echocardiogram 10/2011   EF 55-60%   Hypercholesterolemia    Hyperlipidemia, unspecified    Hypertension    Hypertensive chronic kidney disease with stage 1 through stage 4 chronic kidney disease, or unspecified chronic kidney disease    Hypertensive chronic kidney disease with stage 5 chronic kidney disease or end stage renal disease ([place])  Lymph edema    Lymphedema, not elsewhere classified    Morbid (severe) obesity due to excess calories (HCC)    Obesity hypoventilation syndrome ([place])    On home oxygen therapy    "2L; 24/7" (12/21/2018)   Peritonitis, unspecified ([place])    Pneumonia    in past   Pulmonary hypertension ([place])    Renal disorder    M/W/F [facility] Pt started dialysis in Dec.2016   S/P colonoscopy 05/26/2007   Dr. Laural Golden sigmoid diverticulosis random biopsies benign   S/P endoscopy 05/01/2009   Dr. Penelope Coop pill-induced esophageal ulcerations distal to midesophagus, 2 small ulcers in the antrum of the stomach   Shortness of breath dyspnea    with any exertion or if heart rate  is irregular while on dialysis   Sleep apnea    reports that she no longer needs CPAP due to weight loss   Sleep apnea, unspecified    Type 2 diabetes mellitus with diabetic nephropathy ([place])    Type 2 diabetes mellitus with hyperglycemia (HCC)     Medications:  Medications Prior to Admission  Medication Sig Dispense Refill Last Dose   acetaminophen  (TYLENOL) 325 MG tablet Take 2 tablets (650 mg total) by mouth every 4 (four) hours as needed for mild pain (or Fever >/= 101). 15 tablet 1 PRN   calcium acetate (PHOSLO) 667 MG capsule Take 1,334 mg by mouth 3 (three) times daily.   06/21/2021   calcium carbonate (TUMS - DOSED IN MG ELEMENTAL CALCIUM) 500 MG chewable tablet Chew 2 tablets by mouth 2 (two) times daily.   06/21/2021   cephALEXin (KEFLEX) 500 MG capsule Take 1 capsule (500 mg total) by mouth 2 (two) times daily. Try to take this every 12 hours. On dialysis days, take the first dose after dialysis and the second at bedtime. 10 capsule 0 06/21/2021   ibuprofen (ADVIL) 200 MG tablet Take 400 mg by mouth every 6 (six) hours as needed for moderate pain.   Past Week   lanthanum (FOSRENOL) 500 MG chewable tablet Chew 1,000-1,500 mg by mouth 5 (five) times daily. 1500 mg by mouth three times a day with meals and 1000 mg by mouth twice a day with snacks   06/21/2021   loperamide (IMODIUM) 2 MG capsule Take 2-4 mg by mouth as needed for diarrhea or loose stools.    PRN   multivitamin (RENA-VIT) TABS tablet Take 1 tablet by mouth daily.  11 06/21/2021   Oxycodone HCl 10 MG TABS Take 1 tablet (10 mg total) by mouth 4 (four) times daily as needed (pain). 10 tablet 0 06/21/2021   Sennosides (EX-LAX PO) Take 2 tablets by mouth daily as needed (constipation).   PRN   clindamycin (CLEOCIN) 300 MG capsule Take 1 capsule (300 mg total) by mouth 3 (three) times daily. 16 capsule 0    lactulose (CHRONULAC) 10 GM/15ML solution Take 45 mLs (30 g total) by mouth 2 (two) times daily. For constipation (Patient not taking: Reported on 06/21/2021) 236 mL 0 Not Taking    Assessment: 55 yo female on heparin for afib/flutter. PE ruled out.  She was not on anticoagulation PTA.  Repeat aPTT is therapeutic at 73 seconds.  Goal of Therapy:  Heparin level 0.3-0.7 units/ml Monitor platelets by anticoagulation protocol: Yes   Plan:  Cont heparin 1800/h Daily HL aptt  Arrie Senate, PharmD, BCPS, [facility] Clinical Pharmacist [phone] Please check AMION for all [facility] Pharmacy numbers 07/04/2021

## 2021-07-04 NOTE — Progress Notes (Signed)
[facility] Progress Note Patient Name: [name] DOB: December 25, 1965 MRN: [id number]   Date of Service  07/04/2021  HPI/Events of Note  Patient is s/p cardioversion, on Amiodarone gtt, bedside RN  asking if Amiodarone gtt can be discontinued now.  eICU Interventions  Will defer to daytime PCCM attending physician.        [name] [name] 07/04/2021, 2:15 AM

## 2021-07-04 NOTE — TOC Progression Note (Signed)
Transition of Care [facility]) - Progression Note    Patient Details  Name: HEIRESS WILLIAMSON MRN: [id number] Date of Birth: 22-Jul-1966  Transition of [facility]) CM/SW Contact  Emeterio Reeve, [place] Phone Number: 07/04/2021, 1:42 PM  Clinical Narrative:     CSW called admissions coordinator with [facility]. There was no answer CSW left voice message.   Expected Discharge Plan: [place] Barriers to Discharge: Continued Medical Work up, Ship broker  Expected Discharge Plan and Services Expected Discharge Plan: [place] In-house Referral: Clinical Social Work   Post Acute Care Choice: [place] Living arrangements for the past 2 months: [place]                                       Social Determinants of Health (SDOH) Interventions Food Insecurity Interventions: Intervention Not Indicated Financial Strain Interventions: Intervention Not Indicated Housing Interventions: Intervention Not Indicated Transportation Interventions: Intervention Not Indicated  Readmission Risk Interventions Readmission Risk Prevention Plan 04/30/2019  Transportation Screening Complete  Medication Review Press photographer) Complete  PCP or Specialist appointment within 3-5 days of discharge Complete  HRI or [place] Complete  SW Recovery Care/Counseling Consult Complete  [place] Not Applicable  Some recent data might be hidden   Emeterio Reeve, Latanya Presser, [place] Social Worker [phone]

## 2021-07-04 NOTE — Progress Notes (Signed)
[facility] Progress Note    Assessment/ Plan:   # ESRD: Outpatient orders: [facility], Monday Wednesday Friday, 255 minutes.  350 BFR/500 DFR  1K, 2.5 calcium, 138 sodium, EDW 135.5 kg, 15-gauge needles, machine temp 37 -Has been tolerating UF 3.8-4L with each treatment -Cont MWF treatments, consider extra treatment for volume Tues if needed.   Acute on chronic RV failure with shock, pulmonary HTN -CTA r/o'd PE -TTE with normal EF, mod LVH and severe RV dysfunction -RHC with elevated RA pressure (22), no marked change in pressures or CO with AVF occlusion -Her AVF does not appear to be very high flow and I think potential benefit with fistula ligation is small and I hate to commit her to chronic dialysis with a catheter.  For now we will maintain her AVF, not ligate it.  -on midodrine 10 TID -sildenafil stopped in light of ongoing hypotension   Volume/ hypertension: EDW 135.5kg. UF with HD.  Over EDW still but making progress, wt this AM 137kg.   Anemia of Chronic Kidney Disease: Hb in 10-11s without ESA here to date.    Secondary Hyperparathyroidism/Hyperphosphatemia: sensipar 4 qtx    DM2 with hyperglycemia: mgmt per primary service   # Additional recommendations: - Dose all meds for creatinine clearance < 10 ml/min  - Unless absolutely necessary, no MRIs with gadolinium.  - Implement save arm precautions.  Prefer needle sticks in the dorsum of the hands or wrists.  No blood pressure measurements in arm. - If blood transfusion is requested during hemodialysis sessions, please alert [place] prior to the session. - If a hemodialysis catheter line culture is requested, please alert [place] as only hemodialysis nurses are able to collect those specimens.   Jannifer Hick MD [facility] Pager [phone]   Subjective:   Remains on low dose phenylephrine overnight. Feeling fine.  Remains in NSR s/p DCCV Friday.  Tolerated 4L UF with HD yesterday She  has no complaints    Objective:   BP 123/79   Pulse 60   Temp (!) 97.5 F (36.4 C)   Resp (!) 21   Ht _0  (1.651 m)   Wt (!) 137 kg   SpO2 99%   BMI 50.26 kg/m   Intake/Output Summary (Last 24 hours) at 07/04/2021 1144 Last data filed at 07/04/2021 1000 Gross per 24 hour  Intake 1941.98 ml  Output 4000 ml  Net -2058.02 ml    Weight change: 4 kg  Physical Exam: Gen:nad, sitting up in bed CVS:s1s2, rrr Resp: normal wob NID:POEUM, soft, nt PNT:[id number] bilateral LE's w/ chronic skin changes Neuro: awake, alert, moves all ext spontaneously, speech clear and coherent HD access: lue avf +b/t   Imaging: CARDIAC CATHETERIZATION  Result Date: 07/02/2021 1. Markedly elevated RV filling pressure. 2. Pulmonary arterial hypertension. 3. Normal PCWP 4. Normal cardiac output. The PA pressure and cardiac output did not change with occlusion of AV fistula x 5 minutes.    Labs: BMET Recent Labs  Lab 06/29/21 0628 06/30/21 0609 06/30/21 0825 07/01/21 0550 07/02/21 0053 07/02/21 1309 07/02/21 1310 07/02/21 1321 07/04/21 0456  NA 130* 134* 134* 133* 133* 135 135 135 135  K 4.3 3.9 3.9 5.0 5.3* 4.3 4.2 4.0 5.0  CL 93* 97* 96* 97* 96*  --   --   --  96*  CO2 _1 --   --   --  21*  GLUCOSE 102* 107* 95 207* 197*  --   --   --  100*  BUN 30* 17 18 28* 27*  --   --   --  28*  CREATININE 6.75* 5.18* 5.36* 6.44* 5.26*  --   --   --  5.16*  CALCIUM 8.4* 8.5* 8.5* 7.9* 8.2*  --   --   --  8.6*  PHOS 5.3*  --   --   --   --   --   --   --   --     CBC Recent Labs  Lab 06/30/21 0609 07/01/21 0550 07/02/21 1309 07/02/21 1310 07/02/21 1321 07/03/21 0442 07/04/21 0456  WBC 6.3 5.1  --   --   --  11.0* 11.5*  HGB 9.5* 10.7*   < > 11.9* 11.6* 10.5* 11.2*  HCT 30.9* 33.1*   < > 35.0* 34.0* 33.6* 35.3*  MCV 100.0 98.8  --   --   --  99.7 101.1*  PLT 201 202  --   --   --  282 258   < > = values in this interval not displayed.     Medications:     aspirin  81  mg Oral Daily   calcium acetate  667 mg Oral TID WC   Chlorhexidine Gluconate Cloth  6 each Topical Daily   insulin aspart  0-15 Units Subcutaneous TID WC   insulin aspart  0-5 Units Subcutaneous QHS   mouth rinse  15 mL Mouth Rinse BID   midodrine  15 mg Oral TID WC   sodium chloride flush  3 mL Intravenous Q12H   sodium zirconium cyclosilicate  10 g Oral Once    Jannifer Hick MD [facility] Pager [phone]

## 2021-07-04 NOTE — Evaluation (Addendum)
Clinical/Bedside Swallow Evaluation Patient Details  Name: [name] MRN: [id number] Date of Birth: 05-09-1966  Today's Date: 07/04/2021 Time: SLP Start Time (ACUTE ONLY): 5681 SLP Stop Time (ACUTE ONLY): 2751 SLP Time Calculation (min) (ACUTE ONLY): 20 min  Past Medical History:  Past Medical History:  Diagnosis Date   Allergy    Phreesia 11/04/2020   Anemia, unspecified    Anxiety    Arthritis    Phreesia 11/04/2020   Asthma    as a child   Cancer ([place])    thyroid   Cellulitis    CHF (congestive heart failure) ([place])    Chiari malformation    s/p surgery   Chiari malformation    Chronic kidney disease    Phreesia 11/04/2020   Chronic kidney disease, stage 5 ([place])    Chronic obstructive pulmonary disease, unspecified ([place])    Chronic pain    Complication of anesthesia 11/28/15   Resp arrest after  conscious  sedation   Compression of brain (HCC)    Constipation    COPD (chronic obstructive pulmonary disease) ([place])    Phreesia 11/04/2020   Coronary artery disease    40-50% mid LAD 04/29/09, Medical tx. (Dr. Gwenlyn Found)   Diabetes mellitus    Type II- reports being off all medication d/t it being controlled   Diabetes mellitus without complication ([place])    Phreesia 11/04/2020   Essential (primary) hypertension    Fibromyalgia    Fibromyalgia    History of blood transfusion    hemorrage duinrg pregancy   Hx of echocardiogram 10/2011   EF 55-60%   Hypercholesterolemia    Hyperlipidemia, unspecified    Hypertension    Hypertensive chronic kidney disease with stage 1 through stage 4 chronic kidney disease, or unspecified chronic kidney disease    Hypertensive chronic kidney disease with stage 5 chronic kidney disease or end stage renal disease (HCC)    Lymph edema    Lymphedema, not elsewhere classified    Morbid (severe) obesity due to excess calories ([place])    Obesity hypoventilation syndrome ([place])    On home oxygen therapy    "2L; 24/7" (12/21/2018)    Peritonitis, unspecified ([place])    Pneumonia    in past   Pulmonary hypertension ([place])    Renal disorder    M/W/F [facility] Pt started dialysis in Dec.2016   S/P colonoscopy 05/26/2007   Dr. Laural Golden sigmoid diverticulosis random biopsies benign   S/P endoscopy 05/01/2009   Dr. Penelope Coop pill-induced esophageal ulcerations distal to midesophagus, 2 small ulcers in the antrum of the stomach   Shortness of breath dyspnea    with any exertion or if heart rate  is irregular while on dialysis   Sleep apnea    reports that she no longer needs CPAP due to weight loss   Sleep apnea, unspecified    Type 2 diabetes mellitus with diabetic nephropathy ([place])    Type 2 diabetes mellitus with hyperglycemia ([place])    Past Surgical History:  Past Surgical History:  Procedure Laterality Date   .Hemodialysis catheter Right 11/28/2015   A/V FISTULAGRAM N/A 07/26/2017   Procedure: A/V Fistulagram - Left Arm;  Surgeon: Serafina Mitchell, MD;  Location: [place] [facility];  Service: Cardiovascular;  Laterality: N/A;   ABDOMINAL HYSTERECTOMY     total hysterectomy   ADENOIDECTOMY     AV FISTULA PLACEMENT Left 03/15/2016   Procedure: CREATION OF LEFT ARM ARTERIOVENOUS (AV) FISTULA  ;  Surgeon: Rosetta Posner,  MD;  Location: [place];  Service: Vascular;  Laterality: Left;   CAPD INSERTION N/A 08/30/2017   Procedure: LAPAROSCOPIC INSERTION CONTINUOUS AMBULATORY PERITONEAL DIALYSIS  (CAPD) CATHETER;  Surgeon: Clovis Riley, MD;  Location: [facility];  Service: General;  Laterality: N/A;   CARDIOVERSION N/A 07/03/2021   Procedure: CARDIOVERSION;  Surgeon: Larey Dresser, MD;  Location: [facility] ENDOSCOPY;  Service: Cardiovascular;  Laterality: N/A;   CESAREAN SECTION      x 2   CESAREAN SECTION N/A    Phreesia 11/04/2020   CRANIECTOMY SUBOCCIPITAL W/ CERVICAL LAMINECTOMY / CHIARI     FISTULA SUPERFICIALIZATION Left 05/10/2016   Procedure: Left Arm FISTULA SUPERFICIALIZATION;  Surgeon: Rosetta Posner, MD;  Location: [place];   Service: Vascular;  Laterality: Left;   IR GENERIC HISTORICAL  08/14/2016   IR REMOVAL TUN ACCESS W/ PORT W/O FL MOD SED 08/14/2016 Arne Cleveland, MD [facility]   IR SINUS/FIST TUBE CHK-NON GI  01/01/2019   IR US GUIDE BX ASP/DRAIN  12/28/2018   MINOR REMOVAL OF PERITONEAL DIALYSIS CATHETER N/A 04/26/2019   Procedure: REMOVAL OF INFECTED PERITONEAL DIALYSIS CATHETER;  Surgeon: Erroll Luna, MD;  Location: [place];  Service: General;  Laterality: N/A;   PERIPHERAL VASCULAR BALLOON ANGIOPLASTY Left 10/18/2019   Procedure: PERIPHERAL VASCULAR BALLOON ANGIOPLASTY;  Surgeon: Marty Heck, MD;  Location: [place] [facility];  Service: Cardiovascular;  Laterality: Left;  arm fistula   PERIPHERAL VASCULAR CATHETERIZATION Left 11/25/2016   Procedure: A/V Fistulagram;  Surgeon: Conrad Au Gres, MD;  Location: [place] [facility];  Service: Cardiovascular;  Laterality: Left;  arm   PORTACATH PLACEMENT  07/05/2012   Procedure: INSERTION PORT-A-CATH;  Surgeon: Donato Heinz, MD;  Location: [facility];  Service: General;  Laterality: Left;  subclavian   portacath removal     RIGHT HEART CATH N/A 08/04/2017   Procedure: RIGHT HEART CATH;  Surgeon: Jolaine Artist, MD;  Location: [place] [facility];  Service: Cardiovascular;  Laterality: N/A;   RIGHT HEART CATH N/A 07/02/2021   Procedure: RIGHT HEART CATH;  Surgeon: Larey Dresser, MD;  Location: [place] [facility];  Service: Cardiovascular;  Laterality: N/A;   TEE WITHOUT CARDIOVERSION N/A 07/03/2021   Procedure: TRANSESOPHAGEAL ECHOCARDIOGRAM (TEE);  Surgeon: Larey Dresser, MD;  Location: [facility];  Service: Cardiovascular;  Laterality: N/A;   THYROIDECTOMY, PARTIAL     TONSILLECTOMY     HPI:  55 y.o. female presenting [facility] with lethargy, hypotension and elevated lactic acid after d/c from [facility] earlier that day from admission for cellulitis. This admission patient found to have AMS, septic shock, acute respiratory failure, acute on chronic RV  failure. Transferred to ICU on 7/12 due to hypotension. PMHX significant for obesity, thyroid cancer, fibromyalgia, DMII, COPD, CAD, lymphedema, ESRD on HD MWF and CHF. Regular texture diets have been ordered and discontinued several times since 7/4.   Assessment / Plan / Recommendation Clinical Impression  Pt presents with a suspected esophageal dysphagia from subjective measures. She denies coughing with liquids frequently and reports pharyngeal and chest globus sensation with food. She had dentition removed, has not received dentures prior to hospitalization and chooses softer textures. There were no concerns with pharyngeal phase of swallow during sips thin water and small piece graham cracker clearing oral cavity with liquid wash. Suspect possible dysmotility or narrowing. Therapist educated re: esophageal strategies and use of reverse trendelenburg to better position upright. Recommend regular texture (pt orders softer textures), thin, pills  iwth thin and follow up with GI at discharge if esophageal symptoms worsen. She does not take or have anything ordered for GER. MD please advise if this would be beneficial. No follow up ST needed.  SLP Visit Diagnosis: Dysphagia, unspecified (R13.10)    Aspiration Risk  Mild aspiration risk    Diet Recommendation Regular;Thin liquid   Liquid Administration via: Cup;Straw Medication Administration: Whole meds with liquid Supervision: Patient able to self feed Postural Changes: Seated upright at 90 degrees;Remain upright for at least 30 minutes after po intake    Other  Recommendations Oral Care Recommendations: Oral care BID   Follow up Recommendations None      Frequency and Duration            Prognosis        Swallow Study   General Date of Onset: 06/21/21 HPI: 55 y.o. female presenting [facility] 7[facility] with lethargy, hypotension and elevated lactic acid after d/c from [facility] earlier that day from admission for cellulitis. This admission patient  found to have AMS, septic shock, acute respiratory failure, acute on chronic RV failure. Transferred to ICU on 7/12 due to hypotension. PMHX significant for obesity, thyroid cancer, fibromyalgia, DMII, COPD, CAD, lymphedema, ESRD on HD MWF and CHF. Regular texture diets have been ordered and discontinued several times since 7/4. Type of Study: Bedside Swallow Evaluation Previous Swallow Assessment:  (no) Diet Prior to this Study: Regular;Thin liquids Temperature Spikes Noted: No Respiratory Status: Nasal cannula History of Recent Intubation: No Behavior/Cognition: Alert;Cooperative;Pleasant mood Oral Cavity Assessment: Within Functional Limits Oral Care Completed by SLP: No Oral Cavity - Dentition: Edentulous Vision: Functional for self-feeding Self-Feeding Abilities: Able to feed self Patient Positioning: Upright in bed Baseline Vocal Quality: Normal Volitional Cough: Strong Volitional Swallow: Able to elicit    Oral/Motor/Sensory Function Overall Oral Motor/Sensory Function: Within functional limits   Ice Chips Ice chips: Not tested   Thin Liquid Thin Liquid: Within functional limits Presentation: Straw    Nectar Thick Nectar Thick Liquid: Not tested   Honey Thick Honey Thick Liquid: Not tested   Puree Puree: Not tested   Solid     Solid: Impaired Oral Phase Impairments: Impaired mastication      Houston Siren 07/04/2021,9:40 AM Orbie Pyo Colvin Caroli.Ed Risk analyst [phone] Office [phone]

## 2021-07-04 NOTE — Anesthesia Postprocedure Evaluation (Signed)
Anesthesia Post Note  Patient: [name]  Procedure(s) Performed: TRANSESOPHAGEAL ECHOCARDIOGRAM (TEE) CARDIOVERSION     Patient location during evaluation: Endoscopy Anesthesia Type: General Level of consciousness: awake and alert Pain management: pain level controlled Vital Signs Assessment: post-procedure vital signs reviewed and stable Respiratory status: spontaneous breathing, nonlabored ventilation, respiratory function stable and patient connected to nasal cannula oxygen Cardiovascular status: blood pressure returned to baseline and stable Postop Assessment: no apparent nausea or vomiting Anesthetic complications: no   No notable events documented.  Last Vitals:  Vitals:   07/04/21 0800 07/04/21 0900  BP: 108/74 105/66  Pulse: 60 60  Resp: 18 19  Temp:    SpO2: 100% 99%    Last Pain:  Vitals:   07/04/21 0800  TempSrc:   PainSc: 0-No pain                 Jarae Panas L Khai Torbert

## 2021-07-04 NOTE — Progress Notes (Signed)
NAME:  [name], MRN:  [id number], DOB:  1965/12/29, LOS: 35 ADMISSION DATE:  06/21/2021, CONSULTATION DATE:  06/21/21  REFERRING MD:  Melina Copa, ED, CHIEF COMPLAINT:  lethargy    History of Present Illness:  [name] is a 55 year old woman with hx of obesity, lymphedema, ESRD on HD MWF, CHF, recently hospitalized at [facility] for [facility], discharged 7/3, returning back same day with lethargy, hypotension and elevated lactic acid.  She reported her family was worried about her after d/c because she was sleeping a lot, called to have her brought back to hospital.  No change in pain /tenderness to RLE.   In ED: LR bolus 1.8, doxycycline, cefepime, flagyl given. Admitted for evaluation of hypotension.  She transferred out to the hospitalist service on 7/5.  PCCM called back to see patient for hypotension and tachycardia on 7/12. PE ruled out. Found to have AF/flutter, acute on chronic diastolic CHF with RV failure.  Concern for sepsis resolved but remaining shock thought secondary to RV failure.  Pertinent  Medical History  Anemia Thyroid cancer CHF Chiari malformation ESRD on HD M/W/F COPD?  CAD HLD  HTN DM Lymphedema  Significant Hospital Events: Including procedures, antibiotic start and stop dates in addition to other pertinent events   7/3 admitted to ICU on vasopressors, tachyarrhythmias overnight. Switched from NE> neosynephrine. On heparin. 7/4 Echo with acute on subacute R heart failure, LVH with no RWMA. Trop 3500.  7/5 transferred from ICU to hospitalist service 7/12 called back to see pt re: hypotension (normal lactate) and tachycardia 7/14 BP down overnight, in ST.  RHC with fistula occlusion   Interim History / Subjective:  Underwent successful cardioversion yesterday.  Tolerated 4 hours of CPAP last night.  Interrupted due to coughing spell.  Objective   Blood pressure 123/79, pulse 60, temperature (!) 97.5 F (36.4 C), resp. rate (!) 21, height _0  (1.651 m), weight  (!) 137 kg, SpO2 99 %.        Intake/Output Summary (Last 24 hours) at 07/04/2021 1220 Last data filed at 07/04/2021 1000 Gross per 24 hour  Intake 1941.98 ml  Output 4000 ml  Net -2058.02 ml    Filed Weights   07/03/21 0500 07/03/21 1442 07/03/21 1857  Weight: (!) 141 kg (!) 141 kg (!) 137 kg    Examination: General: chronically ill appearing adult female lying in bed in NAD HEENT: MM pink/dry, anicteric, no visible JVD. Neuro:  AAOx4, speech clear, MAE CV: s1s2 RRR, no m/r/g, sinus rhythm PULM: Now on room air.  Chest clear to auscultation. GI: soft, bsx4 active  Extremities: Bilateral lower extremity brawny edema due to chronic lymphedema. Skin: no rashes or lesions  Resolved Hospital Problem list   Sepsis - completed course daptomycin for cellulitis  RLE Cellulitis   Assessment & Plan:   Critically ill due to cardiogenic shock from RV failure due to group 3 Pulmonary hypertension from untreated sleep apnea. Intermittent Atypical aflutter now converted to sinus  ESRD Suspected OSA/OHS Dysphagia   Plan:  -Transitioning to intermittent hemodialysis per nephrology.  Inability to tolerate intermittent hemodialysis would be a major problem for long-term prognosis. -Wean phenylephrine to keep MAP greater than 65. -We will not proceed with fistula ligation at this time given negative right heart cath -Started on CPAP for OSA.  Compliance will be essential to prevent worsening RV failure.   Best Practice (right click and "Reselect all SmartList Selections" daily)  Diet/type: Regular consistency (see orders) DVT prophylaxis:  systemic heparin GI prophylaxis: PPI Lines: N/A Foley:  N/A   Code Status: Full Code  CRITICAL CARE Performed by: Kipp Brood   Total critical care time: 35 minutes  Critical care time was exclusive of separately billable procedures and treating other patients.  Critical care was necessary to treat or prevent imminent or life-threatening  deterioration.  Critical care was time spent personally by me on the following activities: development of treatment plan with patient and/or surrogate as well as nursing, discussions with consultants, evaluation of patient's response to treatment, examination of patient, obtaining history from patient or surrogate, ordering and performing treatments and interventions, ordering and review of laboratory studies, ordering and review of radiographic studies, pulse oximetry, re-evaluation of patient's condition and participation in multidisciplinary rounds.  Kipp Brood, MD [facility] ICU Physician [place]  Pager: [phone] Mobile: [phone] After hours: [phone].

## 2021-07-05 DIAGNOSIS — I9589 Other hypotension: Secondary | ICD-10-CM | POA: Diagnosis not present

## 2021-07-05 DIAGNOSIS — L8990 Pressure ulcer of unspecified site, unspecified stage: Secondary | ICD-10-CM | POA: Insufficient documentation

## 2021-07-05 DIAGNOSIS — R6521 Severe sepsis with septic shock: Secondary | ICD-10-CM | POA: Diagnosis not present

## 2021-07-05 DIAGNOSIS — A419 Sepsis, unspecified organism: Secondary | ICD-10-CM | POA: Diagnosis not present

## 2021-07-05 LAB — CBC
HCT: 32.9 % — ABNORMAL LOW (ref 36.0–46.0)
Hemoglobin: 10.8 g/dL — ABNORMAL LOW (ref 12.0–15.0)
MCH: 31.7 pg (ref 26.0–34.0)
MCHC: 32.8 g/dL (ref 30.0–36.0)
MCV: 96.5 fL (ref 80.0–100.0)
Platelets: 200 10*3/uL (ref 150–400)
RBC: 3.41 MIL/uL — ABNORMAL LOW (ref 3.87–5.11)
RDW: 16.4 % — ABNORMAL HIGH (ref 11.5–15.5)
WBC: 10.6 10*3/uL — ABNORMAL HIGH (ref 4.0–10.5)
nRBC: 3.8 % — ABNORMAL HIGH (ref 0.0–0.2)

## 2021-07-05 LAB — GLUCOSE, CAPILLARY
Glucose-Capillary: 115 mg/dL — ABNORMAL HIGH (ref 70–99)
Glucose-Capillary: 146 mg/dL — ABNORMAL HIGH (ref 70–99)
Glucose-Capillary: 129 mg/dL — ABNORMAL HIGH (ref 70–99)
Glucose-Capillary: 122 mg/dL — ABNORMAL HIGH (ref 70–99)

## 2021-07-05 LAB — BASIC METABOLIC PANEL
Anion gap: 17 — ABNORMAL HIGH (ref 5–15)
BUN: 43 mg/dL — ABNORMAL HIGH (ref 6–20)
CO2: 23 mmol/L (ref 22–32)
Calcium: 8.6 mg/dL — ABNORMAL LOW (ref 8.9–10.3)
Chloride: 95 mmol/L — ABNORMAL LOW (ref 98–111)
Creatinine, Ser: 6.26 mg/dL — ABNORMAL HIGH (ref 0.44–1.00)
GFR, Estimated: 7 mL/min — ABNORMAL LOW (ref >60)
Glucose, Bld: 156 mg/dL — ABNORMAL HIGH (ref 70–99)
Potassium: 4.4 mmol/L (ref 3.5–5.1)
Sodium: 135 mmol/L (ref 135–145)

## 2021-07-05 LAB — HEPARIN LEVEL (UNFRACTIONATED): Heparin Unfractionated: 0.44 IU/mL (ref 0.30–0.70)

## 2021-07-05 LAB — APTT: aPTT: 40 seconds — ABNORMAL HIGH (ref 24–36)

## 2021-07-05 MED ORDER — HEPARIN BOLUS VIA INFUSION
1500.0000 [IU] | Freq: Once | INTRAVENOUS | Status: AC
  Administered 2021-07-05: 1500 [IU] via INTRAVENOUS
  Filled 2021-07-05: qty 1500

## 2021-07-05 MED ORDER — AMIODARONE HCL 200 MG PO TABS
200.0000 mg | ORAL_TABLET | Freq: Two times a day (BID) | ORAL | Status: DC
  Administered 2021-07-05 – 2021-07-07 (×6): 200 mg via ORAL
  Filled 2021-07-05 (×6): qty 1

## 2021-07-05 NOTE — Plan of Care (Signed)
  Problem: Clinical Measurements: Goal: Ability to maintain clinical measurements within normal limits will improve Outcome: Progressing Goal: Will remain free from infection Outcome: Progressing Goal: Diagnostic test results will improve Outcome: Progressing Goal: Respiratory complications will improve Outcome: Progressing Goal: Cardiovascular complication will be avoided Outcome: Progressing   Problem: Nutrition: Goal: Adequate nutrition will be maintained Outcome: Progressing   Problem: Coping: Goal: Level of anxiety will decrease Outcome: Progressing   Problem: Pain Managment: Goal: General experience of comfort will improve Outcome: Progressing   Problem: Safety: Goal: Ability to remain free from injury will improve Outcome: Progressing   Problem: Skin Integrity: Goal: Risk for impaired skin integrity will decrease Outcome: Progressing

## 2021-07-05 NOTE — Progress Notes (Signed)
ANTICOAGULATION CONSULT NOTE   Pharmacy Consult for heparin Indication: afib/flutter  Allergies  Allergen Reactions   Contrast Media [Iodinated Diagnostic Agents] Anaphylaxis, Hives, Swelling and Other (See Comments)    Dye for cardiac cath. Tongue swells   Pneumococcal Vaccines Swelling and Other (See Comments)    Turns skin black, and bodily swelling   Vancomycin Nausea And Vomiting and Other (See Comments)    Infusion "made me feel like I was dying" had to be readmitted to hospital    Patient Measurements: Height: _0  (165.1 cm) Weight: (!) 139 kg (306 lb 7 oz) IBW/kg (Calculated) : 57 Heparin Dosing Weight:92 kg  Vital Signs: Temp: 97.7 F (36.5 C) (07/17 0700) Temp Source: Oral (07/16 2346) BP: 101/60 (07/17 0615) Pulse Rate: 58 (07/17 0615)  Labs: Recent Labs    07/03/21 0442 07/03/21 2313 07/04/21 0456 07/04/21 1429 07/05/21 0111  HGB 10.5*  --  11.2*  --  10.8*  HCT 33.6*  --  35.3*  --  32.9*  PLT 282  --  258  --  200  APTT 52* 81* 61* 73* 40*  HEPARINUNFRC 0.99* 0.87* 0.76*  --  0.44  CREATININE  --   --  5.16*  --   --      Estimated Creatinine Clearance: 17.7 mL/min (A) (by C-G formula based on SCr of 5.16 mg/dL (H)).   Medical History: Past Medical History:  Diagnosis Date   Allergy    Phreesia 11/04/2020   Anemia, unspecified    Anxiety    Arthritis    Phreesia 11/04/2020   Asthma    as a child   Cancer ([place])    thyroid   Cellulitis    CHF (congestive heart failure) ([place])    Chiari malformation    s/p surgery   Chiari malformation    Chronic kidney disease    Phreesia 11/04/2020   Chronic kidney disease, stage 5 (HCC)    Chronic obstructive pulmonary disease, unspecified ([place])    Chronic pain    Complication of anesthesia 11/28/15   Resp arrest after  conscious  sedation   Compression of brain (HCC)    Constipation    COPD (chronic obstructive pulmonary disease) ([place])    Phreesia 11/04/2020   Coronary artery disease     40-50% mid LAD 04/29/09, Medical tx. (Dr. Gwenlyn Found)   Diabetes mellitus    Type II- reports being off all medication d/t it being controlled   Diabetes mellitus without complication ([place])    Phreesia 11/04/2020   Essential (primary) hypertension    Fibromyalgia    Fibromyalgia    History of blood transfusion    hemorrage duinrg pregancy   Hx of echocardiogram 10/2011   EF 55-60%   Hypercholesterolemia    Hyperlipidemia, unspecified    Hypertension    Hypertensive chronic kidney disease with stage 1 through stage 4 chronic kidney disease, or unspecified chronic kidney disease    Hypertensive chronic kidney disease with stage 5 chronic kidney disease or end stage renal disease (HCC)    Lymph edema    Lymphedema, not elsewhere classified    Morbid (severe) obesity due to excess calories ([place])    Obesity hypoventilation syndrome ([place])    On home oxygen therapy    "2L; 24/7" (12/21/2018)   Peritonitis, unspecified ([place])    Pneumonia    in past   Pulmonary hypertension ([place])    Renal disorder    M/W/F [facility] Pt started dialysis in Dec.2016  S/P colonoscopy 05/26/2007   Dr. Laural Golden sigmoid diverticulosis random biopsies benign   S/P endoscopy 05/01/2009   Dr. Penelope Coop pill-induced esophageal ulcerations distal to midesophagus, 2 small ulcers in the antrum of the stomach   Shortness of breath dyspnea    with any exertion or if heart rate  is irregular while on dialysis   Sleep apnea    reports that she no longer needs CPAP due to weight loss   Sleep apnea, unspecified    Type 2 diabetes mellitus with diabetic nephropathy ([place])    Type 2 diabetes mellitus with hyperglycemia (HCC)     Medications:  Medications Prior to Admission  Medication Sig Dispense Refill Last Dose   acetaminophen (TYLENOL) 325 MG tablet Take 2 tablets (650 mg total) by mouth every 4 (four) hours as needed for mild pain (or Fever >/= 101). 15 tablet 1 PRN   calcium acetate (PHOSLO) 667 MG capsule Take  1,334 mg by mouth 3 (three) times daily.   06/21/2021   calcium carbonate (TUMS - DOSED IN MG ELEMENTAL CALCIUM) 500 MG chewable tablet Chew 2 tablets by mouth 2 (two) times daily.   06/21/2021   cephALEXin (KEFLEX) 500 MG capsule Take 1 capsule (500 mg total) by mouth 2 (two) times daily. Try to take this every 12 hours. On dialysis days, take the first dose after dialysis and the second at bedtime. 10 capsule 0 06/21/2021   ibuprofen (ADVIL) 200 MG tablet Take 400 mg by mouth every 6 (six) hours as needed for moderate pain.   Past Week   lanthanum (FOSRENOL) 500 MG chewable tablet Chew 1,000-1,500 mg by mouth 5 (five) times daily. 1500 mg by mouth three times a day with meals and 1000 mg by mouth twice a day with snacks   06/21/2021   loperamide (IMODIUM) 2 MG capsule Take 2-4 mg by mouth as needed for diarrhea or loose stools.    PRN   multivitamin (RENA-VIT) TABS tablet Take 1 tablet by mouth daily.  11 06/21/2021   Oxycodone HCl 10 MG TABS Take 1 tablet (10 mg total) by mouth 4 (four) times daily as needed (pain). 10 tablet 0 06/21/2021   Sennosides (EX-LAX PO) Take 2 tablets by mouth daily as needed (constipation).   PRN   clindamycin (CLEOCIN) 300 MG capsule Take 1 capsule (300 mg total) by mouth 3 (three) times daily. 16 capsule 0    lactulose (CHRONULAC) 10 GM/15ML solution Take 45 mLs (30 g total) by mouth 2 (two) times daily. For constipation (Patient not taking: Reported on 06/21/2021) 236 mL 0 Not Taking    Assessment: 55 yo female on heparin for afib/flutter. PE ruled out.  She was not on anticoagulation PTA.  Pt s/p DCCV 7/15. aPTT is subtherapeutic, not correlating with HL. CBC remains stable.  Goal of Therapy:  Heparin level 0.3-0.7 units/ml Monitor platelets by anticoagulation protocol: Yes   Plan:  Heparin 1500 units x1 then increase to 2000 units/h Daily heparin level and aPTT   Arrie Senate, PharmD, BCPS, [facility] Clinical Pharmacist [phone] Please check AMION for all [facility]  numbers 07/05/2021

## 2021-07-05 NOTE — Progress Notes (Signed)
[facility] Progress Note Patient Name: [name] DOB: Jul 13, 1966 MRN: [id number]   Date of Service  07/05/2021  HPI/Events of Note  Patient on Phenylephrine IV infusion at 5 mcg/min. Low end of titration orders = 25 mcg/min.   eICU Interventions  Will adjust Phenylephrine IV infusion dose to 0-200 mcg/min. Titrate to MAP >= 65.     Intervention Category Major Interventions: Hypotension - evaluation and management  Lysle Dingwall 07/05/2021, 9:23 PM

## 2021-07-05 NOTE — Progress Notes (Signed)
NAME:  [name], MRN:  [id number], DOB:  11/29/1966, LOS: 71 ADMISSION DATE:  06/21/2021, CONSULTATION DATE:  06/21/21  REFERRING MD:  Melina Copa, ED, CHIEF COMPLAINT:  lethargy    History of Present Illness:  [name] is a 55 year old woman with hx of obesity, lymphedema, ESRD on HD MWF, CHF, recently hospitalized at [facility] for cellulitis, discharged 7/3, returning back same day with lethargy, hypotension and elevated lactic acid.  She reported her family was worried about her after d/c because she was sleeping a lot, called to have her brought back to hospital.  No change in pain /tenderness to RLE.   In ED: LR bolus 1.8, doxycycline, cefepime, flagyl given. Admitted for evaluation of hypotension.  She transferred out to the hospitalist service on 7/5.  PCCM called back to see patient for hypotension and tachycardia on 7/12. PE ruled out. Found to have AF/flutter, acute on chronic diastolic CHF with RV failure.  Concern for sepsis resolved but remaining shock thought secondary to RV failure.  Pertinent  Medical History  Anemia Thyroid cancer CHF Chiari malformation ESRD on HD M/W/F COPD?  CAD HLD  HTN DM Lymphedema  Significant Hospital Events: Including procedures, antibiotic start and stop dates in addition to other pertinent events   7/3 admitted to ICU on vasopressors, tachyarrhythmias overnight. Switched from NE> neosynephrine. On heparin. 7/4 Echo with acute on subacute R heart failure, LVH with no RWMA. Trop 3500.  7/5 transferred from ICU to hospitalist service 7/12 called back to see pt re: hypotension (normal lactate) and tachycardia 7/14 BP down overnight, in ST.  RHC with fistula occlusion did not support diagnosis of high output failure.   Interim History / Subjective:  Nausea overnight so did not wear CPAP.  Nausea settled down.  She has tolerated eating today although her appetite is diminished.  Objective   Blood pressure 100/60, pulse (!) 58, temperature 97.7 F  (36.5 C), resp. rate 18, height 5' 5" (1.651 m), weight (!) 139 kg, SpO2 98 %.        Intake/Output Summary (Last 24 hours) at 07/05/2021 1449 Last data filed at 07/05/2021 1300 Gross per 24 hour  Intake 925.71 ml  Output --  Net 925.71 ml    Filed Weights   07/03/21 1442 07/03/21 1857 07/05/21 0500  Weight: (!) 141 kg (!) 137 kg (!) 139 kg    Examination: General: chronically ill appearing adult female lying in bed in NAD HEENT: MM pink/dry, anicteric, no visible JVD. Neuro:  AAOx4, speech clear, MAE CV: s1s2 RRR, no m/r/g, sinus rhythm PULM: Now on room air.  Chest clear to auscultation. GI: soft, bsx4 active  Extremities: Bilateral lower extremity brawny edema due to chronic lymphedema. Skin: no rashes or lesions  Resolved Hospital Problem list   Sepsis - completed course daptomycin for cellulitis  RLE Cellulitis   Assessment & Plan:   Critically ill due to cardiogenic shock from RV failure due to group 3 Pulmonary hypertension from untreated sleep apnea. Intermittent Atypical aflutter now converted to sinus  ESRD Suspected OSA/OHS Dysphagia   Plan:  -Transitioning to intermittent hemodialysis per nephrology.  Inability to tolerate intermittent hemodialysis would be a major problem for long-term prognosis.  For HD tomorrow. -Wean phenylephrine to keep MAP greater than 65.  Nearly off today. -We will not proceed with fistula ligation at this time given negative right heart cath -Started on CPAP for OSA.  Compliance will be essential to prevent worsening RV failure.  Best Practice (right click and "Reselect all SmartList Selections" daily)  Diet/type: Regular consistency (see orders) DVT prophylaxis: systemic heparin GI prophylaxis: PPI Lines: N/A Foley:  N/A   Code Status: Full Code  CRITICAL CARE Performed by: Kipp Brood   Total critical care time: 35 minutes  Critical care time was exclusive of separately billable procedures and treating other  patients.  Critical care was necessary to treat or prevent imminent or life-threatening deterioration.  Critical care was time spent personally by me on the following activities: development of treatment plan with patient and/or surrogate as well as nursing, discussions with consultants, evaluation of patient's response to treatment, examination of patient, obtaining history from patient or surrogate, ordering and performing treatments and interventions, ordering and review of laboratory studies, ordering and review of radiographic studies, pulse oximetry, re-evaluation of patient's condition and participation in multidisciplinary rounds.  Kipp Brood, MD [facility] ICU Physician [place]  Pager: [phone] Mobile: [phone] After hours: [phone].

## 2021-07-05 NOTE — Progress Notes (Addendum)
Patient ID: [name], female   DOB: Nov 18, 1966, 55 y.o.   MRN: [id number]      Advanced Heart Failure Rounding Note  PCP-Cardiologist: Dr. Ross/ Dr. Aundra Dubin    Patient Profile:    55 y/o female w/ chronic diastolic CHF with prominent RV failure, pulmonary HTN, ESRD on HD admitted w/ septic shock 2/2 LE cellulitis. Septic shock resolved. Now off NE. Completed abx.   Pt seen in consultation by Dr. Aundra Dubin earlier this admit (see notes for full details). AHF team asked to revisit given issues w/ hypotension limiting HD. Was transferred back to ICU 7/12 for profound hypotension and tachycardia w/ HD and started on phenylephrine. PE has been ruled out. AFL captured on tele. Thought to be high output PH primarily in setting of excessive flow through the large left arm AVF. ?Need for fistula ligation. We are asked to assist w/ RHC w/ fistual occlusion to help better assess.   Subjective:    She endorses thigh tenderness, but says it is improved.   Underwent HD Friday with 4L of volume removal.  Phenylephrine dose down to 15 this morning.     RHC done 7/14 with numbers obtained before and after occluding the left arm AVF with BP cuff inflation (heparin bolus given).   RA 22 RV 65/31 PA 68/37, mean 55 PCWP mean 14 Oxygen saturations: PA 62% AO 95% Cardiac Output (Fick) 4.9  Cardiac Index (Fick) 2.09 PVR 8.3 WU Cardiac Output (Thermo) 6.85 Cardiac Index (Thermo) 2.81 PVR 6.0 WU  AFTER OCCLUSION OF AV FISTULA:  PA 66/41, mean 53 Cardiac Output (Thermo) 7.05   Objective:   Weight Range: (!) 139 kg Body mass index is 50.99 kg/m.   Vital Signs:   Temp:  [97.4 F (36.3 C)-98.4 F (36.9 C)] 97.7 F (36.5 C) (07/17 0700) Pulse Rate:  [56-80] 61 (07/17 0800) Resp:  [13-23] 17 (07/17 0800) BP: (88-134)/(50-104) 99/65 (07/17 0800) SpO2:  [91 %-100 %] 96 % (07/17 0800) Weight:  [139 kg] 139 kg (07/17 0500) Last BM Date: 07/01/21  Weight change: Filed Weights   07/03/21  1442 07/03/21 1857 07/05/21 0500  Weight: (!) 141 kg (!) 137 kg (!) 139 kg    Intake/Output:   Intake/Output Summary (Last 24 hours) at 07/05/2021 0914 Last data filed at 07/04/2021 1700 Gross per 24 hour  Intake 377.51 ml  Output --  Net 377.51 ml       Physical Exam    General:  Sitting in bed in no acute distress HEENT: normal Neck: supple. no JVD. Carotids 2+ bilat; no bruits. No lymphadenopathy or thryomegaly appreciated. Cor: PMI nondisplaced. Regular rate & rhythm. No rubs, gallops or murmurs. Lungs: clear Abdomen: obese soft, nontender, nondistended. No hepatosplenomegaly. No bruits or masses. Good bowel sounds. Extremities: no cyanosis, clubbing, rash, + lymphedema Neuro: alert & orientedx3, cranial nerves grossly intact. moves all 4 extremities w/o difficulty. Affect pleasant   Telemetry   Sinus rhythm in 50s-60s.  Personally reviewed.    Labs    CBC Recent Labs    07/04/21 0456 07/05/21 0111  WBC 11.5* 10.6*  HGB 11.2* 10.8*  HCT 35.3* 32.9*  MCV 101.1* 96.5  PLT 258 707    Basic Metabolic Panel Recent Labs    07/02/21 1321 07/04/21 0456  NA 135 135  K 4.0 5.0  CL  --  96*  CO2  --  21*  GLUCOSE  --  100*  BUN  --  28*  CREATININE  --  5.16*  CALCIUM  --  8.6*    Liver Function Tests No results for input(s): AST, ALT, ALKPHOS, BILITOT, PROT, ALBUMIN in the last 72 hours.  No results for input(s): LIPASE, AMYLASE in the last 72 hours. Cardiac Enzymes No results for input(s): CKTOTAL, CKMB, CKMBINDEX, TROPONINI in the last 72 hours.   BNP: BNP (last 3 results) Recent Labs    06/22/21 1400 06/29/21 1534 07/01/21 0550  BNP 590.8* 718.9* 2,062.9*     ProBNP (last 3 results) No results for input(s): PROBNP in the last 8760 hours.   D-Dimer No results for input(s): DDIMER in the last 72 hours.  Hemoglobin A1C No results for input(s): HGBA1C in the last 72 hours.  Fasting Lipid Panel No results for input(s): CHOL, HDL,  LDLCALC, TRIG, CHOLHDL, LDLDIRECT in the last 72 hours. Thyroid Function Tests No results for input(s): TSH, T4TOTAL, T3FREE, THYROIDAB in the last 72 hours.  Invalid input(s): FREET3   Other results:   Imaging    No results found.   Medications:     Scheduled Medications:  aspirin  81 mg Oral Daily   calcium acetate  667 mg Oral TID WC   Chlorhexidine Gluconate Cloth  6 each Topical Daily   insulin aspart  0-15 Units Subcutaneous TID WC   insulin aspart  0-5 Units Subcutaneous QHS   mouth rinse  15 mL Mouth Rinse BID   midodrine  15 mg Oral TID WC   sodium chloride flush  3 mL Intravenous Q12H    Infusions:  sodium chloride     amiodarone 30 mg/hr (07/04/21 2215)   heparin 2,000 Units/hr (07/05/21 0822)   phenylephrine ([name]-SYNEPHRINE) Adult infusion 25 mcg/min (07/05/21 0615)   promethazine (PHENERGAN) injection (IM or IVPB) Stopped (07/02/21 1831)    PRN Medications: acetaminophen, diphenhydrAMINE, docusate sodium, fentaNYL (SUBLIMAZE) injection, lactulose, ondansetron (ZOFRAN) IV, oxyCODONE, polyethylene glycol, promethazine (PHENERGAN) injection (IM or IVPB)    Assessment/Plan   1. Acute on chronic diastolic CHF with prominent RV failure: Echo as above with EF 60-65%, moderate LVH, mod-severe RV dysfunction with severe RVE, PASP 69, mod-severe TR, dilated IVC. This is very similar to prior echo from 2018 with RV looking mildly worse.  She is on phenylephrine 50 again this morning, also midodrine 15 tid. RV failure with severe pulmonary hypertension, RA pressure 22 on RHC on 7/14.  - Volume status much improved after HD yesterday. Renal managing. Discussed at bedside.  2. Pulmonary hypertension: RHC in 8/18 showed severe pulmonary hypertension with relatively low PVR (3.0 WU) and high cardiac output. This was thought to be high output PH primarily in setting of excessive flow through the large left arm AVF. She was not thought to be a candidate for selective  pulmonary vasodilators.  PA systolic pressure by echo is similar this admission (though mildly lower) to the echo from 2018.  Workup for PE was negative this admission. RHC this admission showed pulmonary arterial hypertension, cardiac output not significantly elevated.  Occluding the AV fistula for 5 minutes showed no change in cardiac output or PA pressure.  The study was not consistent with high output HF with pulmonary hypertension from large AV fistula.  It is probable that her PAH is from OHS/OSA.  - Will need to hold sildenafil for now with low BP requiring neo.  Resume as blood pressure tolerates  - Per Dr. Claris Gladden discussion with CCM, despite negative RHC occlusion study, plan is still going to be to ligate/modify AV fistula eventually.  However,  not sure of benefit at this point.  - Suspect underlying OSA. Reports h/o snoring. Will need outpatient sleep study.  3. Shock: Suspect primarily septic shock from cellulitis initially but RV failure is likely the primary etiology now. She is back on phenylephrine this morning.  - Continue midodrine 15 mg tid and Neosynephrine - Wean neo as able  - Long-term concerning prognosis if we are not able to remove fluid via dialysis without pressor as she has significant RV failure.  4. ID: Cellulitis.  Completed abx course w/ daptomycin 5. Aflutter w/ RVR: Atypical,  - s/p DC-CV on 7/15. Remains in NSR - Continue amiodarone 30 mg/hr. Today  - Continue heparin gtt, eventually to Eliquis when long-term plans are fully elucidated.  6. Renal: ESRD.   - HD per nephrology. D/w Dr. Johnney Ou at bedside  7. Dysphagia  - SLP consult for swallow eval    Farley Ly [facility] 07/05/2021 9:14 AM  Agree with above.  Feels ok. Denies SOB. Remains on neo 15 (down from 30) and midodrine 15 tid for BP support. MAPs 70-80s.   Remains in NSR on IV amio. Remains on heparin. No bleeding.    General:  Obese woman lying in bed No resp difficulty HEENT: normal Neck: supple.  nJVP hard o see. Doesn't look markedly elevated Carotids 2+ bilat; no bruits. No lymphadenopathy or thryomegaly appreciated. Cor: PMI nondisplaced. Regular rate & rhythm. No rubs, gallops or murmurs. Lungs: clear Abdomen: obese soft, nontender, nondistended. No hepatosplenomegaly. No bruits or masses. Good bowel sounds. Extremities: no cyanosis, clubbing, rash, +lymphedema Neuro: alert & orientedx3, cranial nerves grossly intact. moves all 4 extremities w/o difficulty. Affect pleasant  Remains on neo and midodrine for BP support. Continue to wean neo as tolerated. Sildenafil stopped to help with wean. Will rap legs with UNNA boots to help.   Remains in NSR. Can switch IV amio to po.   For HD tomorrow.   Plan for possible fistula modification/ligation later this week.  Continue to mobilize.  CRITICAL CARE Performed by: Glori Bickers  Total critical care time: 35 minutes  Critical care time was exclusive of separately billable procedures and treating other patients.  Critical care was necessary to treat or prevent imminent or life-threatening deterioration.  Critical care was time spent personally by me (independent of midlevel providers or residents) on the following activities: development of treatment plan with patient and/or surrogate as well as nursing, discussions with consultants, evaluation of patient's response to treatment, examination of patient, obtaining history from patient or surrogate, ordering and performing treatments and interventions, ordering and review of laboratory studies, ordering and review of radiographic studies, pulse oximetry and re-evaluation of patient's condition.  Glori Bickers, MD  10:58 AM

## 2021-07-05 NOTE — Progress Notes (Signed)
[facility] Progress Note    Assessment/ Plan:   # ESRD: Outpatient orders: [facility], Monday Wednesday Friday, 255 minutes.  350 BFR/500 DFR  1K, 2.5 calcium, 138 sodium, EDW 135.5 kg, 15-gauge needles, machine temp 37 -Has been tolerating UF 3.8-4L with each treatment -Cont MWF treatments, consider extra treatment for volume Tues if needed.   Acute on chronic RV failure with shock, pulmonary HTN -CTA r/o'd PE -TTE with normal EF, mod LVH and severe RV dysfunction -RHC with elevated RA pressure (22), no marked change in pressures or CO with AVF occlusion -Her AVF does not appear to be very high flow and I think potential benefit with fistula ligation is small and I hate to commit her to chronic dialysis with a catheter.  For now we will maintain her AVF, not ligate it.  -on midodrine 15 TID -sildenafil stopped in light of ongoing hypotension   Volume/ hypertension: EDW 135.5kg. UF with HD.  Over EDW still but making progress, wt this AM 139kg.     Anemia of Chronic Kidney Disease: Hb in 10-11s without ESA here to date.    Secondary Hyperparathyroidism/Hyperphosphatemia: sensipar 79 qtx    DM2 with hyperglycemia: mgmt per primary service   # Additional recommendations: - Dose all meds for creatinine clearance < 10 ml/min  - Unless absolutely necessary, no MRIs with gadolinium.  - Implement save arm precautions.  Prefer needle sticks in the dorsum of the hands or wrists.  No blood pressure measurements in arm. - If blood transfusion is requested during hemodialysis sessions, please alert [place] prior to the session. - If a hemodialysis catheter line culture is requested, please alert [place] as only hemodialysis nurses are able to collect those specimens.   Jannifer Hick MD [facility] Pager [phone]   Subjective:   Remains on low dose phenylephrine overnight - 25. Feeling fine.  Remains in NSR s/p DCCV Friday.  Tolerated 4L UF with HD Friday -  on for next HD tomorrow.  Feels volume much improved.  She had nausea, vomiting and chills yesterday; no diarrhea, no measured fevers.  Feeling better this AM.    Objective:   BP 99/65 (BP Location: Right Leg)   Pulse 61   Temp 97.7 F (36.5 C)   Resp 17   Ht _0  (1.651 m)   Wt (!) 139 kg   SpO2 96%   BMI 50.99 kg/m   Intake/Output Summary (Last 24 hours) at 07/05/2021 0948 Last data filed at 07/04/2021 1700 Gross per 24 hour  Intake 377.51 ml  Output --  Net 377.51 ml    Weight change: -2 kg  Physical Exam: Gen:nad, sitting up in bed CVS:s1s2, rrr Resp: normal wob FEO:FHQRF, soft, nt XJO:ITGPQDIYME bilateral LE's w/ chronic skin changes Neuro: awake, alert, moves all ext spontaneously, speech clear and coherent HD access: lue avf +b/t   Imaging: No results found.  Labs: BMET Recent Labs  Lab 06/29/21 0628 06/30/21 0609 06/30/21 0825 07/01/21 0550 07/02/21 0053 07/02/21 1309 07/02/21 1310 07/02/21 1321 07/04/21 0456  NA 130* 134* 134* 133* 133* 135 135 135 135  K 4.3 3.9 3.9 5.0 5.3* 4.3 4.2 4.0 5.0  CL 93* 97* 96* 97* 96*  --   --   --  96*  CO2 _1 --   --   --  21*  GLUCOSE 102* 107* 95 207* 197*  --   --   --  100*  BUN 30* 17  18 28* 27*  --   --   --  28*  CREATININE 6.75* 5.18* 5.36* 6.44* 5.26*  --   --   --  5.16*  CALCIUM 8.4* 8.5* 8.5* 7.9* 8.2*  --   --   --  8.6*  PHOS 5.3*  --   --   --   --   --   --   --   --     CBC Recent Labs  Lab 07/01/21 0550 07/02/21 1309 07/02/21 1321 07/03/21 0442 07/04/21 0456 07/05/21 0111  WBC 5.1  --   --  11.0* 11.5* 10.6*  HGB 10.7*   < > 11.6* 10.5* 11.2* 10.8*  HCT 33.1*   < > 34.0* 33.6* 35.3* 32.9*  MCV 98.8  --   --  99.7 101.1* 96.5  PLT 202  --   --  282 258 200   < > = values in this interval not displayed.     Medications:     aspirin  81 mg Oral Daily   calcium acetate  667 mg Oral TID WC   Chlorhexidine Gluconate Cloth  6 each Topical Daily   insulin aspart  0-15  Units Subcutaneous TID WC   insulin aspart  0-5 Units Subcutaneous QHS   mouth rinse  15 mL Mouth Rinse BID   midodrine  15 mg Oral TID WC   sodium chloride flush  3 mL Intravenous Q12H    Jannifer Hick MD [facility] Pager [phone]

## 2021-07-06 DIAGNOSIS — I50810 Right heart failure, unspecified: Secondary | ICD-10-CM | POA: Diagnosis not present

## 2021-07-06 DIAGNOSIS — J9601 Acute respiratory failure with hypoxia: Secondary | ICD-10-CM | POA: Diagnosis not present

## 2021-07-06 DIAGNOSIS — I2729 Other secondary pulmonary hypertension: Secondary | ICD-10-CM

## 2021-07-06 DIAGNOSIS — R570 Cardiogenic shock: Secondary | ICD-10-CM | POA: Diagnosis not present

## 2021-07-06 DIAGNOSIS — I9589 Other hypotension: Secondary | ICD-10-CM | POA: Diagnosis not present

## 2021-07-06 DIAGNOSIS — E662 Morbid (severe) obesity with alveolar hypoventilation: Secondary | ICD-10-CM

## 2021-07-06 LAB — GLUCOSE, CAPILLARY
Glucose-Capillary: 77 mg/dL (ref 70–99)
Glucose-Capillary: 149 mg/dL — ABNORMAL HIGH (ref 70–99)
Glucose-Capillary: 110 mg/dL — ABNORMAL HIGH (ref 70–99)
Glucose-Capillary: 146 mg/dL — ABNORMAL HIGH (ref 70–99)

## 2021-07-06 LAB — BASIC METABOLIC PANEL
Anion gap: 16 — ABNORMAL HIGH (ref 5–15)
BUN: 47 mg/dL — ABNORMAL HIGH (ref 6–20)
CO2: 25 mmol/L (ref 22–32)
Calcium: 8.8 mg/dL — ABNORMAL LOW (ref 8.9–10.3)
Chloride: 94 mmol/L — ABNORMAL LOW (ref 98–111)
Creatinine, Ser: 6.83 mg/dL — ABNORMAL HIGH (ref 0.44–1.00)
GFR, Estimated: 7 mL/min — ABNORMAL LOW (ref >60)
Glucose, Bld: 121 mg/dL — ABNORMAL HIGH (ref 70–99)
Potassium: 4.7 mmol/L (ref 3.5–5.1)
Sodium: 135 mmol/L (ref 135–145)

## 2021-07-06 LAB — HEPARIN LEVEL (UNFRACTIONATED): Heparin Unfractionated: 0.53 IU/mL (ref 0.30–0.70)

## 2021-07-06 LAB — CBC
HCT: 32.9 % — ABNORMAL LOW (ref 36.0–46.0)
Hemoglobin: 10.6 g/dL — ABNORMAL LOW (ref 12.0–15.0)
MCH: 31.8 pg (ref 26.0–34.0)
MCHC: 32.2 g/dL (ref 30.0–36.0)
MCV: 98.8 fL (ref 80.0–100.0)
Platelets: 266 10*3/uL (ref 150–400)
RBC: 3.33 MIL/uL — ABNORMAL LOW (ref 3.87–5.11)
RDW: 17.1 % — ABNORMAL HIGH (ref 11.5–15.5)
WBC: 10.9 10*3/uL — ABNORMAL HIGH (ref 4.0–10.5)
nRBC: 1.9 % — ABNORMAL HIGH (ref 0.0–0.2)

## 2021-07-06 LAB — APTT: aPTT: 75 seconds — ABNORMAL HIGH (ref 24–36)

## 2021-07-06 MED ORDER — APIXABAN 5 MG PO TABS
5.0000 mg | ORAL_TABLET | Freq: Two times a day (BID) | ORAL | Status: DC
  Administered 2021-07-06 – 2021-08-24 (×96): 5 mg via ORAL
  Filled 2021-07-06 (×96): qty 1

## 2021-07-06 NOTE — Progress Notes (Signed)
Occupational Therapy Treatment Patient Details Name: [name] MRN: [id number] DOB: November 06, 1966 Today's Date: 07/06/2021    History of present illness 55 y.o. female presenting from [place] ED 7/3 with lethargy, hypotension and elevated lactic acid after d/c from [facility] earlier that day. Admitted at [facility] several days ago with cellulitis, given IV antibiotics and d/c'd home secondary to passing of father. This admission patient found to have AMS, septic shock, acute respiratory failure, acute on chronic RV failure. Transferred to ICU on 7/12 due to hypotension. PMHX significant for obesity, DMII, COPD, CAD, lymphedema, ESRD on HD MWF and CHF.   OT comments  Patient supine in bed and upgraded to level 2 theraband, red theraputty.  Completing 10 reps 1 set, 5 second hold for theraband exercises and 10 reps for theraputty. Pt plans to complete exercises 3x/day, and is highly motivated to get stronger.  Will follow acutely.  DC plan remains appropriate, OT will update goals next session.   Follow Up Recommendations  SNF;Supervision/Assistance - 24 hour    Equipment Recommendations  Other (comment) (TBD at next venue of care)    Recommendations for Other Services      Precautions / Restrictions Precautions Precautions: Fall Precaution Comments: monitor HR Restrictions Weight Bearing Restrictions: No       Mobility Bed Mobility               General bed mobility comments: remained bed level for UE strengthening exercises    Transfers                      Balance                                           ADL either performed or assessed with clinical judgement   ADL                                               Vision       Perception     Praxis      Cognition Arousal/Alertness: Awake/alert Behavior During Therapy: WFL for tasks assessed/performed Overall Cognitive Status: Within Functional Limits for tasks assessed                                           Exercises Other Exercises Other Exercises: Issued Level 2 (red) putty and completed 10 reps 1 set of gross grasp, thumb pushes and pinch n pull.  Issued level 2 theraband, completing exercises x 10 reps 5 second hold: foward press, diagonals, and overhead press   Shoulder Instructions       General Comments pt on 3L [place], SpO2 96%, HR 75, BP 103/62    Pertinent Vitals/ Pain       Pain Assessment: No/denies pain  Home Living                                          Prior Functioning/Environment              Frequency  Min 2X/week  Progress Toward Goals  OT Goals(current goals can now be found in the care plan section)  Progress towards OT goals: Progressing toward goals  Acute Rehab OT Goals Patient Stated Goal: To return home with husband and granddaughter. OT Goal Formulation: With patient  Plan Discharge plan remains appropriate;Frequency remains appropriate    Co-evaluation                 AM-PAC OT "6 Clicks" Daily Activity     Outcome Measure   Help from another person eating meals?: None Help from another person taking care of personal grooming?: A Little Help from another person toileting, which includes using toliet, bedpan, or urinal?: Total Help from another person bathing (including washing, rinsing, drying)?: Total Help from another person to put on and taking off regular upper body clothing?: A Little Help from another person to put on and taking off regular lower body clothing?: Total 6 Click Score: 13    End of Session Equipment Utilized During Treatment: Oxygen  OT Visit Diagnosis: Other abnormalities of gait and mobility (R26.89);Muscle weakness (generalized) (M62.81);Pain   Activity Tolerance Patient tolerated treatment well   Patient Left in bed;with call bell/phone within reach;with bed alarm set   Nurse Communication Mobility status        Time:  8264-1583 OT Time Calculation (min): 29 min  Charges: OT General Charges $OT Visit: 1 Visit OT Treatments $Therapeutic Exercise: 23-37 mins  Jolaine Artist, [place] Pager [phone] Office [place] 07/06/2021, 9:23 AM

## 2021-07-06 NOTE — Progress Notes (Addendum)
NAME:  [name], MRN:  [id number], DOB:  28-Sep-1966, LOS: 95 ADMISSION DATE:  06/21/2021, CONSULTATION DATE:  06/21/21  REFERRING MD:  Melina Copa, ED, CHIEF COMPLAINT:  lethargy    History of Present Illness:  [name] is a 55 year old woman with hx of obesity, lymphedema, ESRD on HD MWF, CHF, recently hospitalized at [facility] for cellulitis, discharged 7/3, returning back same day with lethargy, hypotension and elevated lactic acid.  She reported her family was worried about her after d/c because she was sleeping a lot, called to have her brought back to hospital.  No change in pain /tenderness to RLE.   In ED: LR bolus 1.8, doxycycline, cefepime, flagyl given. Admitted for evaluation of hypotension.  She transferred out to the hospitalist service on 7/5.  PCCM called back to see patient for hypotension and tachycardia on 7/12. PE ruled out. Found to have AF/flutter, acute on chronic diastolic CHF with RV failure.  Concern for sepsis resolved but remaining shock thought secondary to RV failure.  Pertinent  Medical History  Anemia Thyroid cancer CHF Chiari malformation ESRD on HD M/W/F COPD?  CAD HLD  HTN DM Lymphedema  Significant Hospital Events: Including procedures, antibiotic start and stop dates in addition to other pertinent events   7/3 admitted to ICU on vasopressors, tachyarrhythmias overnight. Switched from NE> neosynephrine. On heparin. 7/4 Echo with acute on subacute R heart failure, LVH with no RWMA. Trop 3500.  7/5 transferred from ICU to hospitalist service 7/12 called back to see pt re: hypotension (normal lactate) and tachycardia 7/14 BP down overnight, in ST.  RHC with fistula occlusion did not support diagnosis of high output failure.  7/18 neo stopped. Getting first round of iHD.   Interim History / Subjective:  No distress. Fell asleep before she got her CPAP so didn't use it   Objective   Blood pressure 111/61, pulse (Abnormal) 59, temperature 97.7 F (36.5  C), temperature source Oral, resp. rate (Abnormal) 23, height _0  (1.651 m), weight (Abnormal) 140 kg, SpO2 97 %.        Intake/Output Summary (Last 24 hours) at 07/06/2021 1118 Last data filed at 07/06/2021 1000 Gross per 24 hour  Intake 1202.81 ml  Output no documentation  Net 1202.81 ml   Filed Weights   07/03/21 1857 07/05/21 0500 07/06/21 0400  Weight: (Abnormal) 137 kg (Abnormal) 139 kg (Abnormal) 140 kg    Examination: General this is a 55 year old female she is resting in bed. Not In distress HENT NCAT no JVD MMM Pulm dec bases. No accessory use. Minimal oxygen need 2lpm Card RRR no MRG Abd soft + bowel sounds Ext LUE w/ strong bruit and thrill Neuro intact   Resolved Hospital Problem list   Sepsis - completed course daptomycin for cellulitis  RLE Cellulitis   Assessment & Plan:   Critically ill due to cardiogenic shock from RV failure due to group 3 Pulmonary hypertension from untreated sleep apnea. Plan Off neo per HF Cont midodrine 15 mg tid Volume removal w/ dialysis   Intermittent Atypical aflutter now converted to sinus  S/p DC cardioversion 7/15 Plan Cont tele  Amiodarone 200 mg bid DOAC  ESRD Plan iHD per nephro  Am chem   Suspected OSA Plan Cont CPAP at HS Her compliance will be essential for her long term prognosis  The other challenge will be getting her a CPAP at dc  Esophageal Dysphagia  Plan Dysphagia precautions PPI    Best Practice (  right click and "Reselect all SmartList Selections" daily)  Diet/type: Regular consistency (see orders) DVT prophylaxis: systemic heparin GI prophylaxis: PPI Lines: N/A Foley:   N/A   Code Status: Full Code  [name] [employer] Pager # [phone] OR # [phone] if no answer     Attending attestation:  Lindsay. Gauntt is a 55 y/o woman with a history of ESRD on HD admitted with acute respiratory failure with hypoxia and decompensated right heart failure.  She has required repeat ICU admission for hypotension. Today she denies complaints.   BP (!) 94/52   Pulse (!) 59   Temp 98.5 F (36.9 C) (Oral)   Resp (!) 21   Ht _0  (1.651 m)   Wt (!) 140 kg   SpO2 93%   BMI 51.36 kg/m  Chronically ill appearing woman lying in bed in NAD Awake, alert, moving all extremities S1S2, regular rate and rhythm Breathing comfortably on , decreased basilar breath sounds, CTA anteriorly Abd obese, soft, NT Chronic L>R LE lymphedema Skin warm, dry, chronic venous stasis changes BLE  Labs reviewed.  K+ 4.7 Bicarb 25 BUN 47, Cr 6.83 WBC 10.9 H/H 10.6/32.9 Platelets 266  Assessment & plan: Acute decompensated right heart failure with severe PH- suspect WHO group III from untreated OSA & OHS. Recurrent cardiogenic shock due to RHF -needs nocturnal CPAP; will likely be difficult to get her a device at discharge -no role for fistula ligation based on RHC -volume management with HD -unable to tolerate sildenafil recently due to hypotension requiring vasopressors. Discontinue neosynephrine today. -con't midodrine  Acute respiratory failure with hypoxia- question if this is more chronic due to [place] -wean O2 as able to maintain SpO2 >90% -OOB mobility, PH  Chronic anemia 2/2 ESRD -transfuse for Hb <7 or hemodynamically significant bleeding  ESRD -renal diet -renally dose meds -strict I/Os  Atrial flutter with RVR; back in NSR -monitor on tele -switch amiodarone from enteral to IV -eliquis  Dysphagia, h/o GERD -diet upgraded to regular with thin liquids -PPI  This patient is critically ill with multiple organ system failure which requires frequent high complexity decision making, assessment, support, evaluation, and titration of therapies. This was completed through the application of advanced monitoring technologies and extensive interpretation of multiple databases. During this encounter critical care time was devoted to patient care  services described in this note for 36 minutes.  Julian Hy, DO 07/06/21 5:04 PM [facility] [place], DO 07/06/21 5:03 PM [facility]

## 2021-07-06 NOTE — Progress Notes (Signed)
Orthopedic Tech Progress Note Patient Details:  [name] 08-Jul-1966 [id number]  Ortho Devices Type of Ortho Device: Haematologist Ortho Device/Splint Location: BLE Ortho Device/Splint Interventions: Ordered, Application, Adjustment   Post Interventions Patient Tolerated: Well Instructions Provided: Care of device  [name] 07/06/2021, 12:43 PM

## 2021-07-06 NOTE — Progress Notes (Signed)
ANTICOAGULATION CONSULT NOTE   Pharmacy Consult for heparin > apixaban  Indication: afib/flutter  Allergies  Allergen Reactions   Contrast Media [Iodinated Diagnostic Agents] Anaphylaxis, Hives, Swelling and Other (See Comments)    Dye for cardiac cath. Tongue swells   Pneumococcal Vaccines Swelling and Other (See Comments)    Turns skin black, and bodily swelling   Vancomycin Nausea And Vomiting and Other (See Comments)    Infusion "made me feel like I was dying" had to be readmitted to hospital    Patient Measurements: Height: _0  (165.1 cm) Weight: (!) 140 kg (308 lb 10.3 oz) IBW/kg (Calculated) : 57 Heparin Dosing Weight:92 kg  Vital Signs: Temp: 98.5 F (36.9 C) (07/18 1138) Temp Source: Oral (07/18 1138) BP: 94/52 (07/18 1500) Pulse Rate: 59 (07/18 1500)  Labs: Recent Labs    07/04/21 0456 07/04/21 1429 07/05/21 0111 07/05/21 0941 07/06/21 0044  HGB 11.2*  --  10.8*  --  10.6*  HCT 35.3*  --  32.9*  --  32.9*  PLT 258  --  200  --  266  APTT 61* 73* 40*  --  75*  HEPARINUNFRC 0.76*  --  0.44  --  0.53  CREATININE 5.16*  --   --  6.26* 6.83*     Estimated Creatinine Clearance: 13.4 mL/min (A) (by C-G formula based on SCr of 6.83 mg/dL (H)).   Medical History: Past Medical History:  Diagnosis Date   Allergy    Phreesia 11/04/2020   Anemia, unspecified    Anxiety    Arthritis    Phreesia 11/04/2020   Asthma    as a child   Cancer ([place])    thyroid   Cellulitis    CHF (congestive heart failure) ([place])    Chiari malformation    s/p surgery   Chiari malformation    Chronic kidney disease    Phreesia 11/04/2020   Chronic kidney disease, stage 5 (HCC)    Chronic obstructive pulmonary disease, unspecified ([place])    Chronic pain    Complication of anesthesia 11/28/15   Resp arrest after  conscious  sedation   Compression of brain (HCC)    Constipation    COPD (chronic obstructive pulmonary disease) ([place])    Phreesia 11/04/2020   Coronary artery  disease    40-50% mid LAD 04/29/09, Medical tx. (Dr. Gwenlyn Found)   Diabetes mellitus    Type II- reports being off all medication d/t it being controlled   Diabetes mellitus without complication ([place])    Phreesia 11/04/2020   Essential (primary) hypertension    Fibromyalgia    Fibromyalgia    History of blood transfusion    hemorrage duinrg pregancy   Hx of echocardiogram 10/2011   EF 55-60%   Hypercholesterolemia    Hyperlipidemia, unspecified    Hypertension    Hypertensive chronic kidney disease with stage 1 through stage 4 chronic kidney disease, or unspecified chronic kidney disease    Hypertensive chronic kidney disease with stage 5 chronic kidney disease or end stage renal disease (HCC)    Lymph edema    Lymphedema, not elsewhere classified    Morbid (severe) obesity due to excess calories ([place])    Obesity hypoventilation syndrome ([place])    On home oxygen therapy    "2L; 24/7" (12/21/2018)   Peritonitis, unspecified ([place])    Pneumonia    in past   Pulmonary hypertension ([place])    Renal disorder    M/W/F [facility] Pt started dialysis in  Dec.2016   S/P colonoscopy 05/26/2007   Dr. Laural Golden sigmoid diverticulosis random biopsies benign   S/P endoscopy 05/01/2009   Dr. Penelope Coop pill-induced esophageal ulcerations distal to midesophagus, 2 small ulcers in the antrum of the stomach   Shortness of breath dyspnea    with any exertion or if heart rate  is irregular while on dialysis   Sleep apnea    reports that she no longer needs CPAP due to weight loss   Sleep apnea, unspecified    Type 2 diabetes mellitus with diabetic nephropathy ([place])    Type 2 diabetes mellitus with hyperglycemia (HCC)     Medications:  Medications Prior to Admission  Medication Sig Dispense Refill Last Dose   acetaminophen (TYLENOL) 325 MG tablet Take 2 tablets (650 mg total) by mouth every 4 (four) hours as needed for mild pain (or Fever >/= 101). 15 tablet 1 PRN   calcium acetate (PHOSLO) 667 MG  capsule Take 1,334 mg by mouth 3 (three) times daily.   06/21/2021   calcium carbonate (TUMS - DOSED IN MG ELEMENTAL CALCIUM) 500 MG chewable tablet Chew 2 tablets by mouth 2 (two) times daily.   06/21/2021   cephALEXin (KEFLEX) 500 MG capsule Take 1 capsule (500 mg total) by mouth 2 (two) times daily. Try to take this every 12 hours. On dialysis days, take the first dose after dialysis and the second at bedtime. 10 capsule 0 06/21/2021   ibuprofen (ADVIL) 200 MG tablet Take 400 mg by mouth every 6 (six) hours as needed for moderate pain.   Past Week   lanthanum (FOSRENOL) 500 MG chewable tablet Chew 1,000-1,500 mg by mouth 5 (five) times daily. 1500 mg by mouth three times a day with meals and 1000 mg by mouth twice a day with snacks   06/21/2021   loperamide (IMODIUM) 2 MG capsule Take 2-4 mg by mouth as needed for diarrhea or loose stools.    PRN   multivitamin (RENA-VIT) TABS tablet Take 1 tablet by mouth daily.  11 06/21/2021   Oxycodone HCl 10 MG TABS Take 1 tablet (10 mg total) by mouth 4 (four) times daily as needed (pain). 10 tablet 0 06/21/2021   Sennosides (EX-LAX PO) Take 2 tablets by mouth daily as needed (constipation).   PRN   clindamycin (CLEOCIN) 300 MG capsule Take 1 capsule (300 mg total) by mouth 3 (three) times daily. 16 capsule 0    lactulose (CHRONULAC) 10 GM/15ML solution Take 45 mLs (30 g total) by mouth 2 (two) times daily. For constipation (Patient not taking: Reported on 06/21/2021) 236 mL 0 Not Taking    Assessment: 55 yo female on heparin for afib/flutter. PE ruled out.  She was not on anticoagulation PTA.  Pt s/p DCCV 7/15. aPTT 75 sec is therapeutic on heparin drip 2000 uts/hr. CBC remains stable. Plan t change to apixaban Wt >60kg, age < 80y Cr > 1.5  Goal of Therapy:   Monitor platelets by anticoagulation protocol: Yes   Plan:  Stop heparin  Apixaban 70m BID    LBonnita NasutiPharm.D. CPP, BCPS Clinical Pharmacist [phone]/18/2022 4:13 PM   Please check AMION  for all [place] 07/06/2021

## 2021-07-06 NOTE — Progress Notes (Addendum)
Patient ID: [name], female   DOB: 1966/11/17, 55 y.o.   MRN: [id number]      Advanced Heart Failure Rounding Note  PCP-Cardiologist: Dr. Ross/ Dr. Aundra Dubin    Patient Profile:    55 y/o female w/ chronic diastolic CHF with prominent RV failure, pulmonary HTN, ESRD on HD admitted w/ septic shock 2/2 LE cellulitis. Septic shock resolved. Now off NE. Completed abx.   Pt seen in consultation by Dr. Aundra Dubin earlier this admit (see notes for full details). AHF team asked to revisit given issues w/ hypotension limiting HD. Was transferred back to ICU 7/12 for profound hypotension and tachycardia w/ HD and started on phenylephrine. PE has been ruled out. AFL captured on tele. Thought to be high output PH primarily in setting of excessive flow through the large left arm AVF. ?Need for fistula ligation. We are asked to assist w/ RHC w/ fistual occlusion to help better assess.   Subjective:    RHC done 7/14 with numbers obtained before and after occluding the left arm AVF with BP cuff inflation (heparin bolus given).   RA 22 RV 65/31 PA 68/37, mean 55 PCWP mean 14 Oxygen saturations: PA 62% AO 95% Cardiac Output (Fick) 4.9  Cardiac Index (Fick) 2.09 PVR 8.3 WU Cardiac Output (Thermo) 6.85 Cardiac Index (Thermo) 2.81 PVR 6.0 WU  AFTER OCCLUSION OF AV FISTULA:  PA 66/41, mean 53 Cardiac Output (Thermo) 7.05  [name] cut back to 5. Remains on midodrine 15  mg tid.   Feels ok. Unable to stand. At home she is able to stand but cant walk.      Objective:   Weight Range: (!) 140 kg Body mass index is 51.36 kg/m.   Vital Signs:   Temp:  [96.3 F (35.7 C)-97.8 F (36.6 C)] 97.7 F (36.5 C) (07/18 0500) Pulse Rate:  [44-81] 81 (07/18 0715) Resp:  [13-24] 13 (07/18 0715) BP: (83-124)/(50-86) 124/70 (07/18 0715) SpO2:  [84 %-100 %] 99 % (07/18 0715) Weight:  [140 kg] 140 kg (07/18 0400) Last BM Date: 07/01/21  Weight change: Filed Weights   07/03/21 1857 07/05/21 0500  07/06/21 0400  Weight: (!) 137 kg (!) 139 kg (!) 140 kg    Intake/Output:   Intake/Output Summary (Last 24 hours) at 07/06/2021 0813 Last data filed at 07/06/2021 0700 Gross per 24 hour  Intake 1131.75 ml  Output --  Net 1131.75 ml      Physical Exam    General:  In bed. No resp difficulty HEENT: normal Neck: supple. JVP 9-10 . Carotids 2+ bilat; no bruits. No lymphadenopathy or thryomegaly appreciated. Cor: PMI nondisplaced. Regular rate & rhythm. No rubs, gallops or murmurs. Lungs: clear Abdomen: obese, soft, nontender, nondistended. No hepatosplenomegaly. No bruits or masses. Good bowel sounds. Extremities: no cyanosis, clubbing, rash, R and LLE upper thighs with 2+ edema. RUE AVF Neuro: alert & orientedx3, cranial nerves grossly intact. moves all 4 extremities w/o difficulty. Affect pleasant  Telemetry  SR 60s personally reviewed.    Labs    CBC Recent Labs    07/05/21 0111 07/06/21 0044  WBC 10.6* 10.9*  HGB 10.8* 10.6*  HCT 32.9* 32.9*  MCV 96.5 98.8  PLT 200 563   Basic Metabolic Panel Recent Labs    07/05/21 0941 07/06/21 0044  NA 135 135  K 4.4 4.7  CL 95* 94*  CO2 23 25  GLUCOSE 156* 121*  BUN 43* 47*  CREATININE 6.26* 6.83*  CALCIUM 8.6* 8.8*   Liver  Function Tests No results for input(s): AST, ALT, ALKPHOS, BILITOT, PROT, ALBUMIN in the last 72 hours.  No results for input(s): LIPASE, AMYLASE in the last 72 hours. Cardiac Enzymes No results for input(s): CKTOTAL, CKMB, CKMBINDEX, TROPONINI in the last 72 hours.   BNP: BNP (last 3 results) Recent Labs    06/22/21 1400 06/29/21 1534 07/01/21 0550  BNP 590.8* 718.9* 2,062.9*    ProBNP (last 3 results) No results for input(s): PROBNP in the last 8760 hours.   D-Dimer No results for input(s): DDIMER in the last 72 hours.  Hemoglobin A1C No results for input(s): HGBA1C in the last 72 hours.  Fasting Lipid Panel No results for input(s): CHOL, HDL, LDLCALC, TRIG, CHOLHDL,  LDLDIRECT in the last 72 hours. Thyroid Function Tests No results for input(s): TSH, T4TOTAL, T3FREE, THYROIDAB in the last 72 hours.  Invalid input(s): FREET3   Other results:   Imaging    No results found.   Medications:     Scheduled Medications:  amiodarone  200 mg Oral BID   aspirin  81 mg Oral Daily   calcium acetate  667 mg Oral TID WC   Chlorhexidine Gluconate Cloth  6 each Topical Daily   insulin aspart  0-15 Units Subcutaneous TID WC   insulin aspart  0-5 Units Subcutaneous QHS   mouth rinse  15 mL Mouth Rinse BID   midodrine  15 mg Oral TID WC   sodium chloride flush  3 mL Intravenous Q12H    Infusions:  sodium chloride     heparin 2,000 Units/hr (07/06/21 0700)   phenylephrine ([name]-SYNEPHRINE) Adult infusion 5 mcg/min (07/06/21 0700)   promethazine (PHENERGAN) injection (IM or IVPB) Stopped (07/02/21 1831)    PRN Medications: acetaminophen, diphenhydrAMINE, docusate sodium, fentaNYL (SUBLIMAZE) injection, lactulose, ondansetron (ZOFRAN) IV, oxyCODONE, polyethylene glycol, promethazine (PHENERGAN) injection (IM or IVPB)    Assessment/Plan   1. Acute on chronic diastolic CHF with prominent RV failure: Echo as above with EF 60-65%, moderate LVH, mod-severe RV dysfunction with severe RVE, PASP 69, mod-severe TR, dilated IVC. This is very similar to prior echo from 2018 with RV looking mildly worse.  She is on phenylephrine 50 again this morning, also midodrine 15 tid. RV failure with severe pulmonary hypertension, RA pressure 22 on RHC on 7/14.  - Plan for HD later today.  2. Pulmonary hypertension: RHC in 8/18 showed severe pulmonary hypertension with relatively low PVR (3.0 WU) and high cardiac output. This was thought to be high output PH primarily in setting of excessive flow through the large left arm AVF. She was not thought to be a candidate for selective pulmonary vasodilators.  PA systolic pressure by echo is similar this admission (though mildly lower)  to the echo from 2018.  Workup for PE was negative this admission. RHC this admission showed pulmonary arterial hypertension, cardiac output not significantly elevated.  Occluding the AV fistula for 5 minutes showed no change in cardiac output or PA pressure.  The study was not consistent with high output HF with pulmonary hypertension from large AV fistula.  It is probable that her PAH is from OHS/OSA.  - Will need to hold sildenafil for now with low BP requiring neo, will try to restart eventually.   - Stop [name].  - I do not think she would benefit from ligation of the fistula based on the RHC.  - Suspect underlying OSA. Reports h/o snoring. Will need outpatient sleep study.  3. Shock: Suspect primarily septic shock from cellulitis  initially but RV failure is likely the primary etiology now.  - Continue midodrine 15 mg tid, stop phenylephrine today.   - Long-term concerning prognosis if we are not able to remove fluid via dialysis without pressor as she has significant RV failure.  4. ID: Cellulitis.  Completed abx course w/ daptomycin 5. Aflutter w/ RVR: Atypical,  - s/p DC-CV on 7/15. Remains in NSR - Stop amiodarone drip. Start amio 200 mg twice a day.  - Stop heparin gtt and start Eliquis.  6. Renal: ESRD.   - HD per nephrology.  7. Dysphagia  - SLP consult for swallow eval   8. Disposition plan for SNF at d/c   Place Interdry under pannus to help with moisture.   Amy Clegg Np-C  07/06/2021 8:13 AM  Patient seen with NP, agree with the above note.   She remains in NSR today.  She is on phenylephrine 5 and midodrine 15 mg tid. SBP 90s generally.   General: NAD Neck: JVP 14 cm, no thyromegaly or thyroid nodule.  Lungs: Clear to auscultation bilaterally with normal respiratory effort. CV: Nondisplaced PMI.  Heart regular S1/S2, no S3/S4, no murmur.  1+ edema to knees.  Abdomen: Soft, nontender, no hepatosplenomegaly, no distention.  Skin: Intact without lesions or rashes.   Neurologic: Alert and oriented x 3.  Psych: Normal affect. Extremities: No clubbing or cyanosis. Lymphedema.  HEENT: Normal.   Still with volume overload/RV failure.  On minimal phenylephrine.  - Stop phenylephrine, continue midodrine 15 mg tid.  - HD today, needs ongoing volume removal.   In NSR on po amiodarone now.  - Stop heparin gtt, start Eliquis 5 mg bid.   As above, RHC was not suggestive of high output HF/pulmonary hypertension.  Would hold off on AVF ligation as I do not think it will be helpful.   Mobilize with PT.   CRITICAL CARE Performed by: Loralie Champagne  Total critical care time: 35 minutes  Critical care time was exclusive of separately billable procedures and treating other patients.  Critical care was necessary to treat or prevent imminent or life-threatening deterioration.  Critical care was time spent personally by me on the following activities: development of treatment plan with patient and/or surrogate as well as nursing, discussions with consultants, evaluation of patient's response to treatment, examination of patient, obtaining history from patient or surrogate, ordering and performing treatments and interventions, ordering and review of laboratory studies, ordering and review of radiographic studies, pulse oximetry and re-evaluation of patient's condition.  Loralie Champagne 07/06/2021 8:36 AM

## 2021-07-06 NOTE — TOC Progression Note (Signed)
Transition of Care [facility]) - Progression Note    Patient Details  Name: [name] MRN: [id number] Date of Birth: Feb 25, 1966  [facility] CM/SW [place], [place] Phone Number: 07/06/2021, 11:05 AM  Clinical Narrative:     Update: CSW received callback from [place] with [facility] who confirmed SNF bed for patient. Jackelyn Poling would like for CSW to follow back up with her closer to patient being medically ready to confirm the SNF bed is still available. CSW informed Jackelyn Poling that patient has unna boots.. CSW will continue to follow.   CSW left voicemail with Debbie from [facility] to confirm SNF bed. CSW awaiting callback. CSW will continue to follow and assist with DC planning needs.  Expected Discharge Plan: Skilled Nursing Facility Barriers to Discharge: Continued Medical Work up, Ship broker  Expected Discharge Plan and Services Expected Discharge Plan: [place] In-house Referral: Clinical Social Work   Post Acute Care Choice: [place] Living arrangements for the past 2 months: Single Family Home                                       Social Determinants of Health (SDOH) Interventions Food Insecurity Interventions: Intervention Not Indicated Financial Strain Interventions: Intervention Not Indicated Housing Interventions: Intervention Not Indicated Transportation Interventions: Intervention Not Indicated  Readmission Risk Interventions Readmission Risk Prevention Plan 04/30/2019  Transportation Screening Complete  Medication Review Press photographer) Complete  PCP or Specialist appointment within 3-5 days of discharge Complete  HRI or [place] Complete  SW Recovery Care/Counseling Consult Complete  [place] Not Applicable  Some recent data might be hidden

## 2021-07-06 NOTE — Progress Notes (Signed)
[facility] Progress Note    Assessment/ Plan:   # ESRD: Outpatient orders: [facility], Monday Wednesday Friday, 255 minutes.  350 BFR/500 DFR  1K, 2.5 calcium, 138 sodium, EDW 135.5 kg, 15-gauge needles, machine temp 37 -Has been tolerating UF 3.8-4L with each treatment -Cont MWF treatments, consider extra treatment for volume Tues if needed.   Acute on chronic RV failure with shock, pulmonary HTN -CTA r/o'd PE -TTE with normal EF, mod LVH and severe RV dysfunction -RHC with elevated RA pressure (22), no marked change in pressures or CO with AVF occlusion -Her AVF does not appear to be very high flow and I think potential benefit with fistula ligation is small and I hate to commit her to chronic dialysis with a catheter.  For now we will maintain her AVF, not ligate it.  -on midodrine 15 TID -sildenafil stopped in light of ongoing hypotension   Volume/ hypertension: EDW 135.5kg. UF with HD.  Over EDW still but making progress, wt this AM 140 kg.  Trying for another 4 liters today    Anemia of Chronic Kidney Disease: Hb in 10-11s without ESA here to date.    Secondary Hyperparathyroidism/Hyperphosphatemia: sensipar 60 qtx -  no recent phos - will check   DM2 with hyperglycemia: mgmt per primary service     [facility]    Subjective:   Remains on phenylephrine overnight. Feeling fine- no nausea but feels the fluid building up.  Remains in NSR s/p DCCV Friday.  Tolerated 4L UF with HD Friday - on for next HD today      Objective:   BP 124/70   Pulse 81   Temp 97.7 F (36.5 C) (Oral)   Resp 13   Ht 5' 5" (1.651 m)   Wt (!) 140 kg   SpO2 99%   BMI 51.36 kg/m   Intake/Output Summary (Last 24 hours) at 07/06/2021 0759 Last data filed at 07/06/2021 0700 Gross per 24 hour  Intake 1131.75 ml  Output --  Net 1131.75 ml   Weight change: 1 kg  Physical Exam: Gen:nad, sitting up in bed CVS:s1s2, rrr Resp: normal  wob QKS:KSHNG, soft, nt ITJ:LLVDIXVEZB bilateral LE's w/ chronic skin changes Neuro: awake, alert, moves all ext spontaneously, speech clear and coherent HD access: lue avf +b/t   Imaging: No results found.  Labs: BMET Recent Labs  Lab 06/30/21 0609 06/30/21 0825 07/01/21 0550 07/02/21 0053 07/02/21 1309 07/02/21 1310 07/02/21 1321 07/04/21 0456 07/05/21 0941 07/06/21 0044  NA 134* 134* 133* 133* 135 135 135 135 135 135  K 3.9 3.9 5.0 5.3* 4.3 4.2 4.0 5.0 4.4 4.7  CL 97* 96* 97* 96*  --   --   --  96* 95* 94*  CO2 _0 --   --   --  21* 23 25  GLUCOSE 107* 95 207* 197*  --   --   --  100* 156* 121*  BUN 17 18 28* 27*  --   --   --  28* 43* 47*  CREATININE 5.18* 5.36* 6.44* 5.26*  --   --   --  5.16* 6.26* 6.83*  CALCIUM 8.5* 8.5* 7.9* 8.2*  --   --   --  8.6* 8.6* 8.8*   CBC Recent Labs  Lab 07/03/21 0442 07/04/21 0456 07/05/21 0111 07/06/21 0044  WBC 11.0* 11.5* 10.6* 10.9*  HGB 10.5* 11.2* 10.8* 10.6*  HCT 33.6* 35.3* 32.9* 32.9*  MCV  99.7 101.1* 96.5 98.8  PLT 282 258 200 266    Medications:     amiodarone  200 mg Oral BID   aspirin  81 mg Oral Daily   calcium acetate  667 mg Oral TID WC   Chlorhexidine Gluconate Cloth  6 each Topical Daily   insulin aspart  0-15 Units Subcutaneous TID WC   insulin aspart  0-5 Units Subcutaneous QHS   mouth rinse  15 mL Mouth Rinse BID   midodrine  15 mg Oral TID WC   sodium chloride flush  3 mL Intravenous Q12H    [facility]

## 2021-07-06 NOTE — Progress Notes (Signed)
Physical Therapy Treatment Patient Details Name: [name] MRN: [id number] DOB: 1966/08/02 Today's Date: 07/06/2021    History of Present Illness 55 y.o. female presenting from [place] ED 7/3 with lethargy, hypotension and elevated lactic acid after d/c from [facility] earlier that day. Admitted at [facility] several days ago with cellulitis, given IV antibiotics and d/c'd home secondary to passing of father. This admission patient found to have AMS, septic shock, acute respiratory failure, acute on chronic RV failure. Transferred to ICU on 7/12 due to hypotension. PMHX significant for obesity, DMII, COPD, CAD, lymphedema, ESRD on HD MWF and CHF.    PT Comments    Pt is motivated, but going to continue to make slower gains toward goals due to L>R LE edema.  Emphasis on resisted U and LE exercise, transition to EOB, sitting balance EOB before returning to supine.  Pt to go soon to HD.    Follow Up Recommendations  SNF     Equipment Recommendations  Hospital bed    Recommendations for Other Services       Precautions / Restrictions Precautions Precautions: Fall Precaution Comments: monitor HR    Mobility  Bed Mobility Overal bed mobility: Needs Assistance Bed Mobility: Supine to Sit;Sit to Supine     Supine to sit: Mod assist;HOB elevated (and heavy use of the rail) Sit to supine: +2 for physical assistance;Max assist   General bed mobility comments: pt very motivated.  Used the bed functions and moved herself until unable and then pulled on therapist hand.  Pt assisted with UE's to scoot to EOB with mod assist    Transfers                 General transfer comment: NT today due to no consistent 2 person assist.  Ambulation/Gait                 Stairs             Wheelchair Mobility    Modified Rankin (Stroke Patients Only)       Balance Overall balance assessment: Needs assistance Sitting-balance support: No upper extremity supported;Single extremity  supported;Bilateral upper extremity supported Sitting balance-Leahy Scale: Fair Sitting balance - Comments: but can not accept challenge posteriorly at this point.  Can sit EOB on deflated matress without UE's Postural control: Posterior lean                                  Cognition Arousal/Alertness: Awake/alert Behavior During Therapy: WFL for tasks assessed/performed Overall Cognitive Status: Within Functional Limits for tasks assessed                                        Exercises General Exercises - Lower Extremity Straight Leg Raises: AAROM;Strengthening;Both;10 reps;Supine Hip Flexion/Marching: AAROM;Strengthening;Both;10 reps;Supine (AA flexion, graded resistance extension) Other Exercises Other Exercises: graded resistance bil bicep/tricep presses unisolated x 10 reps. Other Exercises: reach to ceiling x10 reps bil.    General Comments General comments (skin integrity, edema, etc.): vss on RA during sitting EOB, sats maintained at 91%  HR in the 70's and low 80's bpm      Pertinent Vitals/Pain Pain Assessment: Faces Faces Pain Scale: Hurts little more Pain Location: L post thigh/leg Pain Descriptors / Indicators: Burning;Discomfort;Aching;Sore Pain Intervention(s): Monitored during session    Home Living  Prior Function            PT Goals (current goals can now be found in the care plan section) Acute Rehab PT Goals Patient Stated Goal: To return home with husband and granddaughter. PT Goal Formulation: With patient Time For Goal Achievement: 07/09/21 Potential to Achieve Goals: Good Progress towards PT goals: Progressing toward goals    Frequency    Min 2X/week      PT Plan Current plan remains appropriate    Co-evaluation              AM-PAC PT "6 Clicks" Mobility   Outcome Measure  Help needed turning from your back to your side while in a flat bed without using bedrails?:  Total Help needed moving from lying on your back to sitting on the side of a flat bed without using bedrails?: A Lot Help needed moving to and from a bed to a chair (including a wheelchair)?: Total Help needed standing up from a chair using your arms (e.g., wheelchair or bedside chair)?: Total Help needed to walk in hospital room?: Total Help needed climbing 3-5 steps with a railing? : Total 6 Click Score: 7    End of Session   Activity Tolerance: Patient tolerated treatment well Patient left: in bed;with call bell/phone within reach Nurse Communication: Mobility status;Need for lift equipment PT Visit Diagnosis: Other abnormalities of gait and mobility (R26.89);Muscle weakness (generalized) (M62.81);Pain Pain - Right/Left: Left Pain - part of body: Leg     Time: 1510-1546 PT Time Calculation (min) (ACUTE ONLY): 36 min  Charges:  $Therapeutic Exercise: 8-22 mins $Therapeutic Activity: 8-22 mins                     07/06/2021  Ginger Carne., PT Acute Rehabilitation Services [phone]  (pager) [phone]  (office)   Tessie Fass Maraki Macquarrie 07/06/2021, 4:02 PM

## 2021-07-07 DIAGNOSIS — I272 Pulmonary hypertension, unspecified: Secondary | ICD-10-CM | POA: Diagnosis not present

## 2021-07-07 DIAGNOSIS — I9589 Other hypotension: Secondary | ICD-10-CM | POA: Diagnosis not present

## 2021-07-07 DIAGNOSIS — I50811 Acute right heart failure: Secondary | ICD-10-CM | POA: Diagnosis not present

## 2021-07-07 DIAGNOSIS — I5043 Acute on chronic combined systolic (congestive) and diastolic (congestive) heart failure: Secondary | ICD-10-CM | POA: Diagnosis not present

## 2021-07-07 DIAGNOSIS — R570 Cardiogenic shock: Secondary | ICD-10-CM | POA: Diagnosis not present

## 2021-07-07 LAB — RENAL FUNCTION PANEL
Albumin: 3 g/dL — ABNORMAL LOW (ref 3.5–5.0)
Anion gap: 16 — ABNORMAL HIGH (ref 5–15)
BUN: 26 mg/dL — ABNORMAL HIGH (ref 6–20)
CO2: 26 mmol/L (ref 22–32)
Calcium: 8.7 mg/dL — ABNORMAL LOW (ref 8.9–10.3)
Chloride: 95 mmol/L — ABNORMAL LOW (ref 98–111)
Creatinine, Ser: 4.97 mg/dL — ABNORMAL HIGH (ref 0.44–1.00)
GFR, Estimated: 10 mL/min — ABNORMAL LOW (ref >60)
Glucose, Bld: 126 mg/dL — ABNORMAL HIGH (ref 70–99)
Phosphorus: 4.4 mg/dL (ref 2.5–4.6)
Potassium: 3.8 mmol/L (ref 3.5–5.1)
Sodium: 137 mmol/L (ref 135–145)

## 2021-07-07 LAB — CBC
HCT: 34.4 % — ABNORMAL LOW (ref 36.0–46.0)
Hemoglobin: 10.4 g/dL — ABNORMAL LOW (ref 12.0–15.0)
MCH: 31 pg (ref 26.0–34.0)
MCHC: 30.2 g/dL (ref 30.0–36.0)
MCV: 102.7 fL — ABNORMAL HIGH (ref 80.0–100.0)
Platelets: 255 10*3/uL (ref 150–400)
RBC: 3.35 MIL/uL — ABNORMAL LOW (ref 3.87–5.11)
RDW: 17.7 % — ABNORMAL HIGH (ref 11.5–15.5)
WBC: 10.6 10*3/uL — ABNORMAL HIGH (ref 4.0–10.5)
nRBC: 0.6 % — ABNORMAL HIGH (ref 0.0–0.2)

## 2021-07-07 LAB — HEPARIN LEVEL (UNFRACTIONATED): Heparin Unfractionated: >1.1 IU/mL — ABNORMAL HIGH (ref 0.30–0.70)

## 2021-07-07 LAB — APTT: aPTT: 35 seconds (ref 24–36)

## 2021-07-07 LAB — GLUCOSE, CAPILLARY
Glucose-Capillary: 101 mg/dL — ABNORMAL HIGH (ref 70–99)
Glucose-Capillary: 213 mg/dL — ABNORMAL HIGH (ref 70–99)
Glucose-Capillary: 113 mg/dL — ABNORMAL HIGH (ref 70–99)
Glucose-Capillary: 143 mg/dL — ABNORMAL HIGH (ref 70–99)

## 2021-07-07 LAB — PATHOLOGIST SMEAR REVIEW: Path Review: INCREASED

## 2021-07-07 MED ORDER — SENNA 8.6 MG PO TABS
1.0000 | ORAL_TABLET | Freq: Every day | ORAL | Status: DC
  Administered 2021-07-07 – 2021-08-23 (×13): 8.6 mg via ORAL
  Filled 2021-07-07 (×30): qty 1

## 2021-07-07 MED ORDER — POLYETHYLENE GLYCOL 3350 17 G PO PACK
17.0000 g | PACK | Freq: Every day | ORAL | Status: DC
  Administered 2021-07-07 – 2021-07-31 (×4): 17 g via ORAL
  Filled 2021-07-07 (×24): qty 1

## 2021-07-07 MED ORDER — CHLORHEXIDINE GLUCONATE CLOTH 2 % EX PADS: 6.0000 | MEDICATED_PAD | Freq: Every day | CUTANEOUS | Status: DC

## 2021-07-07 NOTE — Progress Notes (Signed)
[facility] Progress Note    Assessment/ Plan:   # ESRD: Outpatient orders: [facility], Monday Wednesday Friday, 255 minutes.  350 BFR/500 DFR  1K, 2.5 calcium, 138 sodium, EDW 135.5 kg, 15-gauge needles, machine temp 37 -Has been tolerating UF 3.8-4L with each treatment - will plan for routine HD tomorrow via AVF   Acute on chronic RV failure with shock, pulmonary HTN -CTA r/o'd PE -TTE with normal EF, mod LVH and severe RV dysfunction -RHC with elevated RA pressure (22), no marked change in pressures or CO with AVF occlusion -Her AVF does not appear to be very high flow and I think potential benefit with fistula ligation is small and I hate to commit her to chronic dialysis with a catheter.  For now we will maintain her AVF, not ligate it.  -on midodrine 15 TID -sildenafil stopped in light of ongoing hypotension   Volume/ hypertension: EDW 135.5kg. UF with HD.  Over EDW still but making progress, achieved 4 liters with last HD-  will try again for same tomorrow with her routine HD   Anemia of Chronic Kidney Disease: Hb in 10-11s without ESA here to date.    Secondary Hyperparathyroidism/Hyperphosphatemia: sensipar 60 qtx -  phos OK on phoslo -    DM2 with hyperglycemia: mgmt per primary service     [facility]    Subjective:   HD last night-  removed 4000 but has req intermittent pressor support  as well   -  overall feeling better but has not been out of bed for 3 weeks     Objective:   BP (!) 88/57   Pulse 70   Temp 98 F (36.7 C) (Oral)   Resp 14   Ht _0  (1.651 m)   Wt (!) 140 kg   SpO2 92%   BMI 51.36 kg/m   Intake/Output Summary (Last 24 hours) at 07/07/2021 0757 Last data filed at 07/07/2021 0700 Gross per 24 hour  Intake 503.61 ml  Output 4002 ml  Net -3498.39 ml   Weight change:   Physical Exam: Gen:nad, sitting up in bed CVS:s1s2, rrr Resp: normal wob VEH:MCNOB, soft, nt SJG:[id number]  bilateral LE's w/ chronic skin changes Neuro: awake, alert, moves all ext spontaneously, speech clear and coherent HD access: lue avf +b/t   Imaging: No results found.  Labs: BMET Recent Labs  Lab 06/30/21 0825 07/01/21 0550 07/02/21 0053 07/02/21 1309 07/02/21 1310 07/02/21 1321 07/04/21 0456 07/05/21 0941 07/06/21 0044 07/07/21 0442  NA 134* 133* 133* 135 135 135 135 135 135 137  K 3.9 5.0 5.3* 4.3 4.2 4.0 5.0 4.4 4.7 3.8  CL 96* 97* 96*  --   --   --  96* 95* 94* 95*  CO2 _1 --   --   --  21* _2 GLUCOSE 95 207* 197*  --   --   --  100* 156* 121* 126*  BUN 18 28* 27*  --   --   --  28* 43* 47* 26*  CREATININE 5.36* 6.44* 5.26*  --   --   --  5.16* 6.26* 6.83* 4.97*  CALCIUM 8.5* 7.9* 8.2*  --   --   --  8.6* 8.6* 8.8* 8.7*  PHOS  --   --   --   --   --   --   --   --   --  4.4   CBC Recent Labs  Lab 07/04/21 0456 07/05/21 0111 07/06/21 0044 07/07/21 0442  WBC 11.5* 10.6* 10.9* 10.6*  HGB 11.2* 10.8* 10.6* 10.4*  HCT 35.3* 32.9* 32.9* 34.4*  MCV 101.1* 96.5 98.8 102.7*  PLT 258 200 266 255    Medications:     amiodarone  200 mg Oral BID   apixaban  5 mg Oral BID   calcium acetate  667 mg Oral TID WC   Chlorhexidine Gluconate Cloth  6 each Topical Daily   insulin aspart  0-15 Units Subcutaneous TID WC   insulin aspart  0-5 Units Subcutaneous QHS   mouth rinse  15 mL Mouth Rinse BID   midodrine  15 mg Oral TID WC   sodium chloride flush  3 mL Intravenous Q12H    [facility]

## 2021-07-07 NOTE — TOC Progression Note (Addendum)
Transition of Care (TOC) - Progression Note  Heart Failure   Patient Details  Name: [name] MRN: [id number] Date of Birth: [birth year]  Transition of Care [facility]) CM/SW [place], [place] Phone Number: 07/07/2021, 4:32 PM  Clinical Narrative:    CSW will reach out to [place] to confirm the patients HD schedule MWF and to find out at what the patients chair time is so that the [place] can pass this information along to [place] at [facility]. [facility] is closed today and CSW will reach out tomorrow.  CSW will continue to follow the patient throughout discharge.   Expected Discharge Plan: [place] Barriers to Discharge: Continued Medical Work up, Ship broker  Expected Discharge Plan and Services Expected Discharge Plan: [place] In-house Referral: Clinical Social Work   Post Acute Care Choice: [place] Living arrangements for the past 2 months: Single Family Home                                       Social Determinants of Health (SDOH) Interventions Food Insecurity Interventions: Intervention Not Indicated Financial Strain Interventions: Intervention Not Indicated Housing Interventions: Intervention Not Indicated Transportation Interventions: Intervention Not Indicated  Readmission Risk Interventions Readmission Risk Prevention Plan 04/30/2019  Transportation Screening Complete  Medication Review Press photographer) Complete  PCP or Specialist appointment within 3-5 days of discharge Complete  HRI or [place] Complete  SW Recovery Care/Counseling Consult Complete  [place] Not Applicable  Some recent data might be hidden   Nylene Inlow, MSW, LCSWA [phone] Heart Failure Social Worker

## 2021-07-07 NOTE — Progress Notes (Addendum)
Patient ID: [name], female   DOB: Jul 10, 1966, 55 y.o.   MRN: [id number]      Advanced Heart Failure Rounding Note  PCP-Cardiologist: Dr. Ross/ Dr. Aundra Dubin    Patient Profile:    55 y/o female w/ chronic diastolic CHF with prominent RV failure, pulmonary HTN, ESRD on HD admitted w/ septic shock 2/2 LE cellulitis. Septic shock resolved. Now off NE. Completed abx.   Pt seen in consultation by Dr. Aundra Dubin earlier this admit (see notes for full details). AHF team asked to revisit given issues w/ hypotension limiting HD. Was transferred back to ICU 7/12 for profound hypotension and tachycardia w/ HD and started on phenylephrine. PE has been ruled out. AFL captured on tele. Thought to be high output PH primarily in setting of excessive flow through the large left arm AVF. ?Need for fistula ligation. We are asked to assist w/ RHC w/ fistual occlusion to help better assess.   Subjective:    RHC done 7/14 with numbers obtained before and after occluding the left arm AVF with BP cuff inflation (heparin bolus given).   RA 22 RV 65/31 PA 68/37, mean 55 PCWP mean 14 Oxygen saturations: PA 62% AO 95% Cardiac Output (Fick) 4.9  Cardiac Index (Fick) 2.09 PVR 8.3 WU Cardiac Output (Thermo) 6.85 Cardiac Index (Thermo) 2.81 PVR 6.0 WU  AFTER OCCLUSION OF AV FISTULA:  PA 66/41, mean 53 Cardiac Output (Thermo) 7.05  [name] turned off briefly yesterday but restarted after getting hypotensive w/ HD. Currently on 30 mcg. SBPs in the mid 90s.  4L in fluid removal w/ HD yesterday. Unable to obtain bed wt (scale currently now working).   Maintaining NSR. HR 60s. Feels ok today. No complaints. Continues w/ fluid overload. No dyspnea.    Objective:   Weight Range: (!) 140 kg Body mass index is 51.36 kg/m.   Vital Signs:   Temp:  [97.5 F (36.4 C)-98.5 F (36.9 C)] 98 F (36.7 C) (07/19 0700) Pulse Rate:  [40-73] 71 (07/19 0715) Resp:  [12-28] 19 (07/19 0715) BP: (65-120)/(38-88) 103/56  (07/19 0700) SpO2:  [87 %-100 %] 93 % (07/19 0715) Last BM Date: 07/01/21  Weight change: Filed Weights   07/03/21 1857 07/05/21 0500 07/06/21 0400  Weight: (!) 137 kg (!) 139 kg (!) 140 kg    Intake/Output:   Intake/Output Summary (Last 24 hours) at 07/07/2021 0741 Last data filed at 07/07/2021 0700 Gross per 24 hour  Intake 503.61 ml  Output 4002 ml  Net -3498.39 ml      Physical Exam     General:  morbidly obese F. No respiratory difficulty HEENT: normal Neck: supple. JVD elevated to jaw. Carotids 2+ bilat; no bruits. No lymphadenopathy or thyromegaly appreciated. Cor: PMI nondisplaced. Regular rate & rhythm. No rubs, gallops or murmurs. Lungs: clear Abdomen: obese, soft, nontender, nondistended. No hepatosplenomegaly. No bruits or masses. Good bowel sounds. Extremities: no cyanosis, clubbing, rash, 1+ bilateral LEE R>L + unna boots Neuro: alert & oriented x 3, cranial nerves grossly intact. moves all 4 extremities w/o difficulty. Affect pleasant.   Telemetry   NSR 60s personally reviewed.   Labs    CBC Recent Labs    07/06/21 0044 07/07/21 0442  WBC 10.9* 10.6*  HGB 10.6* 10.4*  HCT 32.9* 34.4*  MCV 98.8 102.7*  PLT 266 983   Basic Metabolic Panel Recent Labs    07/06/21 0044 07/07/21 0442  NA 135 137  K 4.7 3.8  CL 94* 95*  CO2 25  26  GLUCOSE 121* 126*  BUN 47* 26*  CREATININE 6.83* 4.97*  CALCIUM 8.8* 8.7*  PHOS  --  4.4   Liver Function Tests Recent Labs    07/07/21 0442  ALBUMIN 3.0*    No results for input(s): LIPASE, AMYLASE in the last 72 hours. Cardiac Enzymes No results for input(s): CKTOTAL, CKMB, CKMBINDEX, TROPONINI in the last 72 hours.   BNP: BNP (last 3 results) Recent Labs    06/22/21 1400 06/29/21 1534 07/01/21 0550  BNP 590.8* 718.9* 2,062.9*    ProBNP (last 3 results) No results for input(s): PROBNP in the last 8760 hours.   D-Dimer No results for input(s): DDIMER in the last 72 hours.  Hemoglobin  A1C No results for input(s): HGBA1C in the last 72 hours.  Fasting Lipid Panel No results for input(s): CHOL, HDL, LDLCALC, TRIG, CHOLHDL, LDLDIRECT in the last 72 hours. Thyroid Function Tests No results for input(s): TSH, T4TOTAL, T3FREE, THYROIDAB in the last 72 hours.  Invalid input(s): FREET3   Other results:   Imaging    No results found.   Medications:     Scheduled Medications:  amiodarone  200 mg Oral BID   apixaban  5 mg Oral BID   aspirin  81 mg Oral Daily   calcium acetate  667 mg Oral TID WC   Chlorhexidine Gluconate Cloth  6 each Topical Daily   insulin aspart  0-15 Units Subcutaneous TID WC   insulin aspart  0-5 Units Subcutaneous QHS   mouth rinse  15 mL Mouth Rinse BID   midodrine  15 mg Oral TID WC   sodium chloride flush  3 mL Intravenous Q12H    Infusions:  sodium chloride     phenylephrine ([name]-SYNEPHRINE) Adult infusion 35 mcg/min (07/07/21 0515)   promethazine (PHENERGAN) injection (IM or IVPB) Stopped (07/02/21 1831)    PRN Medications: acetaminophen, diphenhydrAMINE, docusate sodium, fentaNYL (SUBLIMAZE) injection, lactulose, ondansetron (ZOFRAN) IV, oxyCODONE, polyethylene glycol, promethazine (PHENERGAN) injection (IM or IVPB)    Assessment/Plan   1. Acute on chronic diastolic CHF with prominent RV failure: Echo as above with EF 60-65%, moderate LVH, mod-severe RV dysfunction with severe RVE, PASP 69, mod-severe TR, dilated IVC. This is very similar to prior echo from 2018 with RV looking mildly worse.  She is on phenylephrine 30 again this morning, also midodrine 15 tid. RV failure with severe pulmonary hypertension, RA pressure 22 on RHC on 7/14.  - needs ongoing HD for fluid removal  2. Pulmonary hypertension: RHC in 8/18 showed severe pulmonary hypertension with relatively low PVR (3.0 WU) and high cardiac output. This was thought to be high output PH primarily in setting of excessive flow through the large left arm AVF. She was not  thought to be a candidate for selective pulmonary vasodilators.  PA systolic pressure by echo is similar this admission (though mildly lower) to the echo from 2018.  Workup for PE was negative this admission. RHC this admission showed pulmonary arterial hypertension, cardiac output not significantly elevated.  Occluding the AV fistula for 5 minutes showed no change in cardiac output or PA pressure.  The study was not consistent with high output HF with pulmonary hypertension from large AV fistula.  It is probable that her PAH is from OHS/OSA.  - continue to hold sildenafil for now with low BP requiring neo, will try to restart eventually.   - Wean [name] as tolerated  - I do not think she would benefit from ligation of the fistula  based on the [place].  - Suspect underlying OSA. Reports h/o snoring. Will need outpatient sleep study.  3. Shock: Suspect primarily septic shock from cellulitis initially but RV failure is likely the primary etiology now.  - Continue midodrine 15 mg tid, wean phenylephrine as tolerated   - Long-term concerning prognosis if we are not able to remove fluid via dialysis without pressor as she has significant RV failure.  4. ID: Cellulitis.  Completed abx course w/ daptomycin 5. Aflutter w/ RVR: Atypical,  - s/p DC-CV on 7/15. Remains in NSR - Continue PO amio 200 mg twice a day.  - Continue Eliquis.  6. Renal: ESRD.   - HD per nephrology.  7. Dysphagia  - SLP following  8. Disposition plan for SNF at d/c    Lyda Jester PA-C   07/07/2021 7:41 AM  Patient seen with NP, agree with the above note.   She remains in NSR today.  She is on phenylephrine 30 and midodrine 15 mg tid. SBP 90s generally.   General: NAD Neck: JVP 12-14 cm. no thyromegaly or thyroid nodule.  Lungs: Clear to auscultation bilaterally with normal respiratory effort. CV: Nondisplaced PMI.  Heart regular S1/S2, no S3/S4, no murmur.  1+ ankle edema.   Abdomen: Soft, nontender, no hepatosplenomegaly,  no distention.  Skin: Intact without lesions or rashes.  Neurologic: Alert and oriented x 3.  Psych: Normal affect. Extremities: No clubbing or cyanosis.  HEENT: Normal.     Still with volume overload/RV failure.  On low dose phenylephrine. - Titrate off phenylephrine, continue midodrine 15 mg tid. - Would favor HD run again today if possible, needs ongoing volume removal.   In NSR on po amiodarone now. - Continue Eliquis 5 mg bid. - Can stop ASA.    As above, RHC was not suggestive of high output HF/pulmonary hypertension.  Would hold off on AVF ligation as I do not think it will be helpful.   Mobilize with PT. Will need SNF when stable.    CRITICAL CARE Performed by: Loralie Champagne  Total critical care time: 35 minutes  Critical care time was exclusive of separately billable procedures and treating other patients.  Critical care was necessary to treat or prevent imminent or life-threatening deterioration.  Critical care was time spent personally by me on the following activities: development of treatment plan with patient and/or surrogate as well as nursing, discussions with consultants, evaluation of patient's response to treatment, examination of patient, obtaining history from patient or surrogate, ordering and performing treatments and interventions, ordering and review of laboratory studies, ordering and review of radiographic studies, pulse oximetry and re-evaluation of patient's condition.  Loralie Champagne 07/07/2021 8:00 AM

## 2021-07-07 NOTE — Progress Notes (Signed)
NAME:  [name], MRN:  [id number], DOB:  [birth year], LOS: 18 ADMISSION DATE:  06/21/2021, CONSULTATION DATE:  06/21/21  REFERRING MD:  Melina Copa, ED, CHIEF COMPLAINT:  lethargy    History of Present Illness:  [name] is a 55 year old woman with hx of obesity, lymphedema, ESRD on HD MWF, CHF, recently hospitalized at [facility] for cellulitis, discharged 7/3, returning back same day with lethargy, hypotension and elevated lactic acid.  She reported her family was worried about her after d/c because she was sleeping a lot, called to have her brought back to hospital.  No change in pain /tenderness to RLE.   In ED: LR bolus 1.8, doxycycline, cefepime, flagyl given. Admitted for evaluation of hypotension.  She transferred out to the hospitalist service on 7/5.  PCCM called back to see patient for hypotension and tachycardia on 7/12. PE ruled out. Found to have AF/flutter, acute on chronic diastolic CHF with RV failure.  Concern for sepsis resolved but remaining shock thought secondary to RV failure.  Pertinent  Medical History  Anemia Thyroid cancer CHF Chiari malformation ESRD on HD M/W/F COPD?  CAD HLD  HTN DM Lymphedema  Significant Hospital Events: Including procedures, antibiotic start and stop dates in addition to other pertinent events   7/3 admitted to ICU on vasopressors, tachyarrhythmias overnight. Switched from NE> neosynephrine. On heparin. 7/4 Echo with acute on subacute R heart failure, LVH with no RWMA. Trop 3500.  7/5 transferred from ICU to hospitalist service 7/12 called back to see pt re: hypotension (normal lactate) and tachycardia 7/14 BP down overnight, in ST.  RHC with fistula occlusion did not support diagnosis of high output failure.  7/18 neo stopped. Getting first round of iHD.  Hypotensive during dialysis, had to be placed back on low-dose phenylephrine but did get 4 L of fluid off  Interim History / Subjective:  Feels well.  No distress  Objective   Blood  pressure (Abnormal) 88/42, pulse 65, temperature 98 F (36.7 C), temperature source Oral, resp. rate 15, height _0  (1.651 m), weight (Abnormal) 140 kg, SpO2 95 %.        Intake/Output Summary (Last 24 hours) at 07/07/2021 0949 Last data filed at 07/07/2021 0900 Gross per 24 hour  Intake 531.33 ml  Output 4002 ml  Net -3470.67 ml   Filed Weights   07/03/21 1857 07/05/21 0500 07/06/21 0400  Weight: (Abnormal) 137 kg (Abnormal) 139 kg (Abnormal) 140 kg    Examination: General 55 year old female resting in bed currently no distress HEENT normocephalic atraumatic mild JVD, mucous membranes moist Pulmonary: Diminished bases no accessory use currently on nasal cannula support Cardiac: Regular rate and rhythm Abdomen: Soft nontender Extremities: Warm and dry, left upper extremity AVG with good bruit and thrill Neuro intact   Resolved Hospital Problem list   Sepsis - completed course daptomycin for cellulitis  RLE Cellulitis   Assessment & Plan:   Critically ill due to cardiogenic shock from RV failure due to group 3 Pulmonary hypertension from untreated sleep apnea. -She is back on phenylephrine after 4 L of volume removed. -I wonder if we are close to euvolemic state.   Plan Continuing her current midodrine dosing is 15 mg 3 times daily Weaning phenylephrine, hoping she will equilibrate over the day and will no longer need this Might consider more conservative volume removal strategy  Will place case management consult to attempt to assist with CPAP set up  Intermittent Atypical aflutter now converted to sinus  S/p DC cardioversion 7/15 Plan Continuing DOAC Continue amiodarone twice daily Continue telemetry  ESRD Plan Intermittent dialysis planned for 7/23 A.m. chemistry  Suspected OSA Plan CPAP at at bedtime Placing order to assist with set up  Esophageal Dysphagia  Plan Dysphagia precautions PPI   Best Practice (right click and "Reselect all SmartList  Selections" daily)  Diet/type: Regular consistency (see orders) DVT prophylaxis: DOAC GI prophylaxis: PPI Lines: N/A Foley:  N/A   Code Status: Full Code  [name] ACNP-BC [place] Pager # [phone] OR # [phone] if no answer

## 2021-07-08 DIAGNOSIS — N186 End stage renal disease: Secondary | ICD-10-CM | POA: Diagnosis not present

## 2021-07-08 DIAGNOSIS — R570 Cardiogenic shock: Secondary | ICD-10-CM | POA: Diagnosis not present

## 2021-07-08 DIAGNOSIS — Z992 Dependence on renal dialysis: Secondary | ICD-10-CM

## 2021-07-08 DIAGNOSIS — I50810 Right heart failure, unspecified: Secondary | ICD-10-CM | POA: Diagnosis not present

## 2021-07-08 DIAGNOSIS — I9589 Other hypotension: Secondary | ICD-10-CM | POA: Diagnosis not present

## 2021-07-08 DIAGNOSIS — I2729 Other secondary pulmonary hypertension: Secondary | ICD-10-CM | POA: Diagnosis not present

## 2021-07-08 LAB — RENAL FUNCTION PANEL
Albumin: 2.8 g/dL — ABNORMAL LOW (ref 3.5–5.0)
Anion gap: 10 (ref 5–15)
BUN: 34 mg/dL — ABNORMAL HIGH (ref 6–20)
CO2: 27 mmol/L (ref 22–32)
Calcium: 8.7 mg/dL — ABNORMAL LOW (ref 8.9–10.3)
Chloride: 97 mmol/L — ABNORMAL LOW (ref 98–111)
Creatinine, Ser: 5.96 mg/dL — ABNORMAL HIGH (ref 0.44–1.00)
GFR, Estimated: 8 mL/min — ABNORMAL LOW (ref >60)
Glucose, Bld: 134 mg/dL — ABNORMAL HIGH (ref 70–99)
Phosphorus: 4.6 mg/dL (ref 2.5–4.6)
Potassium: 4.6 mmol/L (ref 3.5–5.1)
Sodium: 134 mmol/L — ABNORMAL LOW (ref 135–145)

## 2021-07-08 LAB — CBC
HCT: 33.4 % — ABNORMAL LOW (ref 36.0–46.0)
Hemoglobin: 10.6 g/dL — ABNORMAL LOW (ref 12.0–15.0)
MCH: 31.5 pg (ref 26.0–34.0)
MCHC: 31.7 g/dL (ref 30.0–36.0)
MCV: 99.1 fL (ref 80.0–100.0)
Platelets: 220 10*3/uL (ref 150–400)
RBC: 3.37 MIL/uL — ABNORMAL LOW (ref 3.87–5.11)
RDW: 18.1 % — ABNORMAL HIGH (ref 11.5–15.5)
WBC: 11.6 10*3/uL — ABNORMAL HIGH (ref 4.0–10.5)
nRBC: 0.3 % — ABNORMAL HIGH (ref 0.0–0.2)

## 2021-07-08 LAB — GLUCOSE, CAPILLARY
Glucose-Capillary: 133 mg/dL — ABNORMAL HIGH (ref 70–99)
Glucose-Capillary: 158 mg/dL — ABNORMAL HIGH (ref 70–99)
Glucose-Capillary: 108 mg/dL — ABNORMAL HIGH (ref 70–99)
Glucose-Capillary: 157 mg/dL — ABNORMAL HIGH (ref 70–99)

## 2021-07-08 MED ORDER — AMIODARONE HCL 200 MG PO TABS
200.0000 mg | ORAL_TABLET | Freq: Every day | ORAL | Status: DC
  Administered 2021-07-08 – 2021-08-24 (×45): 200 mg via ORAL
  Filled 2021-07-08 (×46): qty 1

## 2021-07-08 MED ORDER — ALBUMIN HUMAN 25 % IV SOLN
25.0000 g | Freq: Once | INTRAVENOUS | Status: AC
Start: 2021-07-08 — End: 2021-07-10
  Administered 2021-07-10: 25 g via INTRAVENOUS
  Filled 2021-07-08: qty 100

## 2021-07-08 NOTE — Progress Notes (Addendum)
NAME:  [name], MRN:  [id number], DOB:  06-14-1966, LOS: 35 ADMISSION DATE:  06/21/2021, CONSULTATION DATE:  06/21/21  REFERRING MD:  Melina Copa, ED, CHIEF COMPLAINT:  lethargy    History of Present Illness:  [name] is a 55 year old woman with hx of obesity, lymphedema, ESRD on HD MWF, CHF, recently hospitalized at [facility] for cellulitis, discharged 7/3, returning back same day with lethargy, hypotension and elevated lactic acid.  She reported her family was worried about her after d/c because she was sleeping a lot, called to have her brought back to hospital.  No change in pain /tenderness to RLE.   In ED: LR bolus 1.8, doxycycline, cefepime, flagyl given. Admitted for evaluation of hypotension.  She transferred out to the hospitalist service on 7/5.  PCCM called back to see patient for hypotension and tachycardia on 7/12. PE ruled out. Found to have AF/flutter, acute on chronic diastolic CHF with RV failure.  Concern for sepsis resolved but remaining shock thought secondary to RV failure.  Pertinent  Medical History  Anemia Thyroid cancer Diastolic CHF Chiari malformation ESRD on HD M/W/F COPD?  CAD HLD  HTN DM Lymphedema  Significant Hospital Events: Including procedures, antibiotic start and stop dates in addition to other pertinent events   7/3 admitted to ICU on vasopressors, tachyarrhythmias overnight. Switched from NE> neosynephrine. On heparin. 7/4 Echo with acute on subacute R heart failure, LVH with no RWMA. Trop 3500.  7/5 transferred from ICU to hospitalist service 7/12 called back to see pt re: hypotension (normal lactate) and tachycardia 7/14 BP down overnight, in ST.  RHC with fistula occlusion did not support diagnosis of high output failure.  7/18 neo stopped. Getting first round of iHD.  Hypotensive during dialysis, had to be placed back on low-dose phenylephrine but did get 4 L of fluid off 7/20 remains on low dose neo  Interim History / Subjective:   No  events overnight; slept with cpap On 25 of [name] Plan for iHD today  Objective   Blood pressure 111/63, pulse 63, temperature 98 F (36.7 C), temperature source Oral, resp. rate 19, height _0  (1.651 m), weight (!) 140 kg, SpO2 94 %.        Intake/Output Summary (Last 24 hours) at 07/08/2021 9470 Last data filed at 07/07/2021 1900 Gross per 24 hour  Intake 606.64 ml  Output --  Net 606.64 ml    Filed Weights   07/03/21 1857 07/05/21 0500 07/06/21 0400  Weight: (!) 137 kg (!) 139 kg (!) 140 kg    Examination: General:  NAD HEENT: MM pink/moist; [place] in place Neuro: aox3 CV: s1s2, RRR, no m/r/g PULM:  dim clear bs bilaterally; 2 liters [place] GI: soft, bsx4 active  Extremities: warm/dry, BLE wrapped in dressing Skin: no rashes or lesions   Resolved Hospital Problem list   Sepsis - completed course daptomycin for cellulitis  RLE Cellulitis   Assessment & Plan:   Critically ill due to cardiogenic shock from RV failure due to group 3 Pulmonary hypertension from untreated sleep apnea. P: -HF following -continue midodrine -wean neo for MAP >65 -sildenafil on hold due to hypotension -continue iHD   Intermittent Atypical aflutter now converted to sinus: S/p DC cardioversion 7/15 P: -continue amio and eliquis -telemetry monitoring  ESRD P: -Nephro following -plan for iHD today -trend BMP  Suspected OSA P: -continue CPAP QHS -consider changing CPAP mask for better fit; patient complained of some leakage noise overnight -will need to  be followed up at discharge  Esophageal Dysphagia  P: -Dysphagia precautions -PPI  DM2 P: -SSI and CBG   Best Practice (right click and "Reselect all SmartList Selections" daily)  Diet/type: Regular consistency (see orders) DVT prophylaxis: DOAC GI prophylaxis: PPI Lines: N/A Foley:  N/A   Code Status: Full Code  Critical care time: 35 minutes  JD Rexene Agent [facility] 07/08/2021, 9:21 AM  Please  see [email or web address] for pager details.  From 7A-7P if no response, please call [phone]. After hours, please call ELink [phone].

## 2021-07-08 NOTE — Progress Notes (Signed)
Physical Therapy Treatment Patient Details Name: [name] MRN: [id number] DOB: 05/06/1966 Today's Date: 07/08/2021    History of Present Illness 55 y.o. female presenting from [facility] ED 7/3 with lethargy, hypotension and elevated lactic acid after d/c from [facility] earlier that day. Admitted at [facility] several days ago with cellulitis, given IV antibiotics and d/c'd home secondary to passing of father. This admission patient found to have AMS, septic shock, acute respiratory failure, acute on chronic RV failure. Transferred to ICU on 7/12 due to hypotension. PMHX significant for obesity, DMII, COPD, CAD, lymphedema, ESRD on HD MWF and CHF.    PT Comments    Pt was too painful to accomplish all the activity she was mentally ready to try.  Emphasis on warm up exercise, transition to EOB with balance and scooting work.  Plan to attempt standing in the STEDY was abort once in the STEDY due to pt's L LE hurting too much.    Follow Up Recommendations  SNF     Equipment Recommendations  Hospital bed    Recommendations for Other Services       Precautions / Restrictions Precautions Precautions: Fall    Mobility  Bed Mobility Overal bed mobility: Needs Assistance Bed Mobility: Supine to Sit;Sit to Supine     Supine to sit: Mod assist;HOB elevated Sit to supine: +2 for physical assistance;Max assist   General bed mobility comments: pt needed subjectively more assist for bed mobility today due to significant pain, but still pushed through as much as possible.  cues for technique and assist of trunk up and forward, pt also pulling on assist like a rail.    Transfers                 General transfer comment: attempt to stand today.  transitioned into the [place] stedy, but pt was too painful at EOB ( left leg) that she could not attempt to lean forward to grab the bar much less try to stand.  Ambulation/Gait                 Stairs             Wheelchair Mobility     Modified Rankin (Stroke Patients Only)       Balance Overall balance assessment: Needs assistance Sitting-balance support: No upper extremity supported;Single extremity supported;Bilateral upper extremity supported Sitting balance-Leahy Scale: Fair Sitting balance - Comments: one positioned with assist, pt able to maintain sitting without assist, but again cannot accept challenge. Postural control: Posterior lean                                  Cognition Arousal/Alertness: Awake/alert Behavior During Therapy: WFL for tasks assessed/performed Overall Cognitive Status: Within Functional Limits for tasks assessed                                        Exercises General Exercises - Lower Extremity Straight Leg Raises: AAROM;Strengthening;10 reps;Supine;Right Hip Flexion/Marching: AAROM;Strengthening;10 reps;Supine;Right Other Exercises Other Exercises: graded resistance bil bicep/tricep presses unisolated x 10 reps.    General Comments        Pertinent Vitals/Pain Pain Assessment: Faces Faces Pain Scale: Hurts whole lot Pain Location: L post thigh/leg Pain Descriptors / Indicators: Burning;Discomfort;Aching;Sore Pain Intervention(s): Monitored during session;Limited activity within patient's tolerance    Home Living  Prior Function            PT Goals (current goals can now be found in the care plan section) Acute Rehab PT Goals Patient Stated Goal: To return home with husband and granddaughter. PT Goal Formulation: With patient Time For Goal Achievement: 07/09/21 Potential to Achieve Goals: Good Progress towards PT goals: Progressing toward goals (slow progression)    Frequency    Min 2X/week      PT Plan Current plan remains appropriate    Co-evaluation              AM-PAC PT "6 Clicks" Mobility   Outcome Measure  Help needed turning from your back to your side while in a flat bed  without using bedrails?: Total Help needed moving from lying on your back to sitting on the side of a flat bed without using bedrails?: A Lot Help needed moving to and from a bed to a chair (including a wheelchair)?: Total Help needed standing up from a chair using your arms (e.g., wheelchair or bedside chair)?: Total Help needed to walk in hospital room?: Total Help needed climbing 3-5 steps with a railing? : Total 6 Click Score: 7    End of Session   Activity Tolerance: Patient tolerated treatment well Patient left: in bed;with call bell/phone within reach Nurse Communication: Mobility status;Need for lift equipment PT Visit Diagnosis: Other abnormalities of gait and mobility (R26.89);Muscle weakness (generalized) (M62.81);Pain Pain - Right/Left: Left Pain - part of body: Leg     Time: 2841-3244 PT Time Calculation (min) (ACUTE ONLY): 30 min  Charges:  $Therapeutic Exercise: 8-22 mins $Therapeutic Activity: 8-22 mins                     07/08/2021  [name]., PT Acute Rehabilitation Services [phone]  (pager) [phone]  (office)   [name] [name] 07/08/2021, 5:21 PM

## 2021-07-08 NOTE — Progress Notes (Addendum)
Patient ID: [name], female   DOB: 15-Jun-1966, 55 y.o.   MRN: [id number]      Advanced Heart Failure Rounding Note  PCP-Cardiologist: Dr. Ross/ Dr. Aundra Dubin    Patient Profile:    55 y/o female w/ chronic diastolic CHF with prominent RV failure, pulmonary HTN, ESRD on HD admitted w/ septic shock 2/2 LE cellulitis. Septic shock resolved. Now off NE. Completed abx.   Pt seen in consultation by Dr. Aundra Dubin earlier this admit (see notes for full details). AHF team asked to revisit given issues w/ hypotension limiting HD. Was transferred back to ICU 7/12 for profound hypotension and tachycardia w/ HD and started on phenylephrine. PE has been ruled out. AFL captured on tele. Thought to be high output PH primarily in setting of excessive flow through the large left arm AVF. ?Need for fistula ligation. We are asked to assist w/ RHC w/ fistual occlusion to help better assess.   Subjective:    RHC done 7/14 with numbers obtained before and after occluding the left arm AVF with BP cuff inflation (heparin bolus given).   RA 22 RV 65/31 PA 68/37, mean 55 PCWP mean 14 Oxygen saturations: PA 62% AO 95% Cardiac Output (Fick) 4.9  Cardiac Index (Fick) 2.09 PVR 8.3 WU Cardiac Output (Thermo) 6.85 Cardiac Index (Thermo) 2.81 PVR 6.0 WU  AFTER OCCLUSION OF AV FISTULA:  PA 66/41, mean 53 Cardiac Output (Thermo) 7.05  7/19 [name] restarted. Currently on [name] 25 + midodrine 15 mg three times a day.   No complaints. Says she is looking forward to dialysis.  Objective:   Weight Range: (!) 140 kg Body mass index is 51.36 kg/m.   Vital Signs:   Temp:  [97.4 F (36.3 C)-98.4 F (36.9 C)] 98 F (36.7 C) (07/20 0700) Pulse Rate:  [61-74] 63 (07/20 0500) Resp:  [12-24] 19 (07/20 0500) BP: (72-125)/(40-89) 111/63 (07/20 0500) SpO2:  [88 %-100 %] 94 % (07/20 0500) Last BM Date: 07/01/21  Weight change: Filed Weights   07/03/21 1857 07/05/21 0500 07/06/21 0400  Weight: (!) 137 kg (!) 139 kg  (!) 140 kg    Intake/Output:   Intake/Output Summary (Last 24 hours) at 07/08/2021 0722 Last data filed at 07/07/2021 1900 Gross per 24 hour  Intake 685.43 ml  Output --  Net 685.43 ml      Physical Exam   General: No resp difficulty HEENT: normal Neck: supple. JVP 11-12 Carotids 2+ bilat; no bruits. No lymphadenopathy or thryomegaly appreciated. Cor: PMI nondisplaced. Regular rate & rhythm. No rubs, gallops or murmurs. Lungs: clear Abdomen: obese, soft, nontender, nondistended. No hepatosplenomegaly. No bruits or masses. Good bowel sounds. Extremities: no cyanosis, clubbing, rash, R and LLE 1-2+ edema above unna boots. LUE AVF Neuro: alert & orientedx3, cranial nerves grossly intact. moves all 4 extremities w/o difficulty. Affect pleasant   Telemetry   SR 60s personally reviewed.   Labs    CBC Recent Labs    07/07/21 0442 07/08/21 0410  WBC 10.6* 11.6*  HGB 10.4* 10.6*  HCT 34.4* 33.4*  MCV 102.7* 99.1  PLT 255 536   Basic Metabolic Panel Recent Labs    07/07/21 0442 07/08/21 0410  NA 137 134*  K 3.8 4.6  CL 95* 97*  CO2 26 27  GLUCOSE 126* 134*  BUN 26* 34*  CREATININE 4.97* 5.96*  CALCIUM 8.7* 8.7*  PHOS 4.4 4.6   Liver Function Tests Recent Labs    07/07/21 0442 07/08/21 0410  ALBUMIN 3.0* 2.8*  No results for input(s): LIPASE, AMYLASE in the last 72 hours. Cardiac Enzymes No results for input(s): CKTOTAL, CKMB, CKMBINDEX, TROPONINI in the last 72 hours.   BNP: BNP (last 3 results) Recent Labs    06/22/21 1400 06/29/21 1534 07/01/21 0550  BNP 590.8* 718.9* 2,062.9*    ProBNP (last 3 results) No results for input(s): PROBNP in the last 8760 hours.   D-Dimer No results for input(s): DDIMER in the last 72 hours.  Hemoglobin A1C No results for input(s): HGBA1C in the last 72 hours.  Fasting Lipid Panel No results for input(s): CHOL, HDL, LDLCALC, TRIG, CHOLHDL, LDLDIRECT in the last 72 hours. Thyroid Function Tests No  results for input(s): TSH, T4TOTAL, T3FREE, THYROIDAB in the last 72 hours.  Invalid input(s): FREET3   Other results:   Imaging    No results found.   Medications:     Scheduled Medications:  amiodarone  200 mg Oral BID   apixaban  5 mg Oral BID   calcium acetate  667 mg Oral TID WC   Chlorhexidine Gluconate Cloth  6 each Topical Daily   Chlorhexidine Gluconate Cloth  6 each Topical Q0600   insulin aspart  0-15 Units Subcutaneous TID WC   insulin aspart  0-5 Units Subcutaneous QHS   mouth rinse  15 mL Mouth Rinse BID   midodrine  15 mg Oral TID WC   polyethylene glycol  17 g Oral Daily   senna  1 tablet Oral Daily   sodium chloride flush  3 mL Intravenous Q12H    Infusions:  sodium chloride     phenylephrine ([name]-SYNEPHRINE) Adult infusion 25 mcg/min (07/08/21 0124)   promethazine (PHENERGAN) injection (IM or IVPB) Stopped (07/02/21 1831)    PRN Medications: acetaminophen, diphenhydrAMINE, docusate sodium, fentaNYL (SUBLIMAZE) injection, lactulose, ondansetron (ZOFRAN) IV, oxyCODONE, polyethylene glycol, promethazine (PHENERGAN) injection (IM or IVPB)    Assessment/Plan   1. Acute on chronic diastolic CHF with prominent RV failure: Echo as above with EF 60-65%, moderate LVH, mod-severe RV dysfunction with severe RVE, PASP 69, mod-severe TR, dilated IVC. This is very similar to prior echo from 2018 with RV looking mildly worse.  She is on phenylephrine 30 again this morning, also midodrine 15 tid. RV failure with severe pulmonary hypertension, RA pressure 22 on RHC on 7/14.  - HD for volume management.   2. Pulmonary hypertension: RHC in 8/18 showed severe pulmonary hypertension with relatively low PVR (3.0 WU) and high cardiac output. This was thought to be high output PH primarily in setting of excessive flow through the large left arm AVF. She was not thought to be a candidate for selective pulmonary vasodilators.  PA systolic pressure by echo is similar this  admission (though mildly lower) to the echo from 2018.  Workup for PE was negative this admission. RHC this admission showed pulmonary arterial hypertension, cardiac output not significantly elevated.  Occluding the AV fistula for 5 minutes showed no change in cardiac output or PA pressure.  The study was not consistent with high output HF with pulmonary hypertension from large AV fistula.  It is probable that her PAH is from OHS/OSA.  - continue to hold sildenafil for now with low BP requiring neo, will try to restart eventually.   - Remains on [name] 25 + midodrine 15 mg tid.  - Per Dr Aundra Dubin not felt to benefit from ligation of the fistula based on the RHC.  - Suspect underlying OSA. Reports h/o snoring. Will need outpatient sleep study.  3. Shock: Suspect primarily septic shock from cellulitis initially but RV failure is likely the primary etiology now.  - Continue midodrine 15 mg tid, wean phenylephrine as tolerated   - Long-term concerning prognosis if we are not able to remove fluid via dialysis without pressor as she has significant RV failure.  4. ID: Cellulitis.  Completed abx course w/ daptomycin 5. Aflutter w/ RVR: Atypical,  - s/p DC-CV on 7/15. Remains in SR. - Cut back amio to 200 mg daily.  - Continue Eliquis.  6. Renal: ESRD.   - HD per nephrology.  7. Dysphagia  - SLP following  8. Disposition plan for SNF at d/c   Amy Clegg NP-C  07/08/2021 7:22 AM  Patient seen with NP, agree with the above note.   She remains in NSR today.  She is on phenylephrine 25 still and midodrine 15 mg tid. SBP 100s today.    General: NAD Neck: JVP 14+, no thyromegaly or thyroid nodule.  Lungs: Clear to auscultation bilaterally with normal respiratory effort. CV: Nondisplaced PMI.  Heart regular S1/S2, no S3/S4, no murmur.  Legs wrapped, 1+ edema to knees.  Abdomen: Soft, nontender, no hepatosplenomegaly, no distention.  Skin: Intact without lesions or rashes.  Neurologic: Alert and oriented x  3.  Psych: Normal affect. Extremities: No clubbing or cyanosis.  HEENT: Normal.   Still with volume overload/RV failure.  On low dose phenylephrine 25, still have not titrated off. - Continue midodrine 15 tid.  - Needs ongoing volume removal, HD today.  - After HD, need to work on getting her off phenylephrine.  Would tolerate SBP > 85. Will have to show eventually that she can dialyze off phenylephrine.    In NSR on po amiodarone now. - Continue Eliquis 5 mg bid. - Decreased amiodarone to qd.    As above, RHC was not suggestive of high output HF/pulmonary hypertension.  Would hold off on AVF ligation as I do not think it will be helpful.  She has PAH likely due to OSA/OSH.  BP too low at this point for sildenafil trial.    Mobilize with PT. Will need SNF when stable.    CRITICAL CARE Performed by: Loralie Champagne  Total critical care time: 35 minutes  Critical care time was exclusive of separately billable procedures and treating other patients.  Critical care was necessary to treat or prevent imminent or life-threatening deterioration.  Critical care was time spent personally by me on the following activities: development of treatment plan with patient and/or surrogate as well as nursing, discussions with consultants, evaluation of patient's response to treatment, examination of patient, obtaining history from patient or surrogate, ordering and performing treatments and interventions, ordering and review of laboratory studies, ordering and review of radiographic studies, pulse oximetry and re-evaluation of patient's condition.  Loralie Champagne 07/08/2021 7:56 AM

## 2021-07-08 NOTE — Progress Notes (Signed)
[facility] Progress Note    Assessment/ Plan:   # ESRD: Outpatient orders: [facility], Monday Wednesday Friday, 255 minutes.  350 BFR/500 DFR  1K, 2.5 calcium, 138 sodium, EDW 135.5 kg, 15-gauge needles, machine temp 37 -Has now been tolerating UF 3.8-4L with each treatment - will plan for routine HD today via AVF   Acute on chronic RV failure with shock, pulmonary HTN -CTA r/o'd PE -TTE with normal EF, mod LVH and severe RV dysfunction -RHC with elevated RA pressure (22), no marked change in pressures or CO with AVF occlusion -Her AVF does not appear to be very high flow.  For now we will maintain her AVF, not ligate it.  -on midodrine 15 TID -sildenafil stopped in light of ongoing hypotension   Volume/ hypertension: EDW 135.5kg. UF with HD.  Over EDW still but making progress, achieved 4 liters with last HD-  will try again for same today with her routine HD   Anemia of Chronic Kidney Disease: Hb in 10-11s without ESA here to date.    Secondary Hyperparathyroidism/Hyperphosphatemia: sensipar 60 qtx -  phos OK on phoslo -    DM2 with hyperglycemia: mgmt per primary service     [facility]    Subjective:   No major issues overnight     Objective:   BP 111/63   Pulse 63   Temp 98 F (36.7 C) (Oral)   Resp 19   Ht _0  (1.651 m)   Wt (!) 140 kg   SpO2 94%   BMI 51.36 kg/m   Intake/Output Summary (Last 24 hours) at 07/08/2021 0741 Last data filed at 07/07/2021 1900 Gross per 24 hour  Intake 685.43 ml  Output --  Net 685.43 ml   Weight change:   Physical Exam: Gen:nad, sitting up in bed CVS:s1s2, rrr Resp: normal wob YFV:CBSWH, soft, nt QPR:[id number] bilateral LE's w/ chronic skin changes Neuro: awake, alert, moves all ext spontaneously, speech clear and coherent HD access: lue avf +b/t   Imaging: No results found.  Labs: BMET Recent Labs  Lab 07/02/21 0053 07/02/21 1309 07/02/21 1310  07/02/21 1321 07/04/21 0456 07/05/21 0941 07/06/21 0044 07/07/21 0442 07/08/21 0410  NA 133*   < > 135 135 135 135 135 137 134*  K 5.3*   < > 4.2 4.0 5.0 4.4 4.7 3.8 4.6  CL 96*  --   --   --  96* 95* 94* 95* 97*  CO2 24  --   --   --  21* _1 GLUCOSE 197*  --   --   --  100* 156* 121* 126* 134*  BUN 27*  --   --   --  28* 43* 47* 26* 34*  CREATININE 5.26*  --   --   --  5.16* 6.26* 6.83* 4.97* 5.96*  CALCIUM 8.2*  --   --   --  8.6* 8.6* 8.8* 8.7* 8.7*  PHOS  --   --   --   --   --   --   --  4.4 4.6   < > = values in this interval not displayed.   CBC Recent Labs  Lab 07/05/21 0111 07/06/21 0044 07/07/21 0442 07/08/21 0410  WBC 10.6* 10.9* 10.6* 11.6*  HGB 10.8* 10.6* 10.4* 10.6*  HCT 32.9* 32.9* 34.4* 33.4*  MCV 96.5 98.8 102.7* 99.1  PLT 200 266 255 220    Medications:     amiodarone  200 mg Oral Daily   apixaban  5 mg Oral BID   calcium acetate  667 mg Oral TID WC   Chlorhexidine Gluconate Cloth  6 each Topical Daily   Chlorhexidine Gluconate Cloth  6 each Topical Q0600   insulin aspart  0-15 Units Subcutaneous TID WC   insulin aspart  0-5 Units Subcutaneous QHS   mouth rinse  15 mL Mouth Rinse BID   midodrine  15 mg Oral TID WC   polyethylene glycol  17 g Oral Daily   senna  1 tablet Oral Daily   sodium chloride flush  3 mL Intravenous Q12H    [facility]

## 2021-07-08 NOTE — TOC Progression Note (Signed)
Transition of Care (TOC) - Progression Note  Heart Failure   Patient Details  Name: [name] MRN: [id number] Date of Birth: [birth year]  Transition of Care [facility]) CM/SW [place], [place] Phone Number: 07/08/2021, 9:22 AM  Clinical Narrative:    CSW called Lenoard Aden [phone] and found out Ms. Franson's chair time for HD is at 6:15am to roughly 10:30am. CSW called Jackelyn Poling with [facility] [phone] and left a voicemail for her to return the call.  CSW will continue to follow throughout discharge.   Expected Discharge Plan: [place] Barriers to Discharge: Continued Medical Work up, Ship broker  Expected Discharge Plan and Services Expected Discharge Plan: [place] In-house Referral: Clinical Social Work   Post Acute Care Choice: [place] Living arrangements for the past 2 months: Single Family Home                                       Social Determinants of Health (SDOH) Interventions Food Insecurity Interventions: Intervention Not Indicated Financial Strain Interventions: Intervention Not Indicated Housing Interventions: Intervention Not Indicated Transportation Interventions: Intervention Not Indicated  Readmission Risk Interventions Readmission Risk Prevention Plan 04/30/2019  Transportation Screening Complete  Medication Review Press photographer) Complete  PCP or Specialist appointment within 3-5 days of discharge Complete  HRI or [place] Complete  SW Recovery Care/Counseling Consult Complete  [place] Not Applicable  Some recent data might be hidden   Hajime Asfaw, MSW, LCSWA [phone] Heart Failure Social Worker

## 2021-07-09 DIAGNOSIS — J9622 Acute and chronic respiratory failure with hypercapnia: Secondary | ICD-10-CM | POA: Diagnosis not present

## 2021-07-09 DIAGNOSIS — I2729 Other secondary pulmonary hypertension: Secondary | ICD-10-CM | POA: Diagnosis not present

## 2021-07-09 DIAGNOSIS — I50810 Right heart failure, unspecified: Secondary | ICD-10-CM | POA: Diagnosis not present

## 2021-07-09 DIAGNOSIS — J9621 Acute and chronic respiratory failure with hypoxia: Secondary | ICD-10-CM

## 2021-07-09 DIAGNOSIS — R579 Shock, unspecified: Secondary | ICD-10-CM | POA: Diagnosis not present

## 2021-07-09 LAB — CBC
HCT: 33.1 % — ABNORMAL LOW (ref 36.0–46.0)
Hemoglobin: 10.5 g/dL — ABNORMAL LOW (ref 12.0–15.0)
MCH: 31.4 pg (ref 26.0–34.0)
MCHC: 31.7 g/dL (ref 30.0–36.0)
MCV: 99.1 fL (ref 80.0–100.0)
Platelets: 203 10*3/uL (ref 150–400)
RBC: 3.34 MIL/uL — ABNORMAL LOW (ref 3.87–5.11)
RDW: 17.7 % — ABNORMAL HIGH (ref 11.5–15.5)
WBC: 10 10*3/uL (ref 4.0–10.5)
nRBC: 0 % (ref 0.0–0.2)

## 2021-07-09 LAB — MAGNESIUM: Magnesium: 2.1 mg/dL (ref 1.7–2.4)

## 2021-07-09 LAB — RENAL FUNCTION PANEL
Albumin: 3.1 g/dL — ABNORMAL LOW (ref 3.5–5.0)
Anion gap: 11 (ref 5–15)
BUN: 24 mg/dL — ABNORMAL HIGH (ref 6–20)
CO2: 26 mmol/L (ref 22–32)
Calcium: 8.7 mg/dL — ABNORMAL LOW (ref 8.9–10.3)
Chloride: 98 mmol/L (ref 98–111)
Creatinine, Ser: 4.64 mg/dL — ABNORMAL HIGH (ref 0.44–1.00)
GFR, Estimated: 11 mL/min — ABNORMAL LOW (ref >60)
Glucose, Bld: 143 mg/dL — ABNORMAL HIGH (ref 70–99)
Phosphorus: 3.6 mg/dL (ref 2.5–4.6)
Potassium: 4.1 mmol/L (ref 3.5–5.1)
Sodium: 135 mmol/L (ref 135–145)

## 2021-07-09 LAB — GLUCOSE, CAPILLARY
Glucose-Capillary: 130 mg/dL — ABNORMAL HIGH (ref 70–99)
Glucose-Capillary: 149 mg/dL — ABNORMAL HIGH (ref 70–99)
Glucose-Capillary: 151 mg/dL — ABNORMAL HIGH (ref 70–99)
Glucose-Capillary: 152 mg/dL — ABNORMAL HIGH (ref 70–99)

## 2021-07-09 MED ORDER — RENA-VITE PO TABS
1.0000 | ORAL_TABLET | Freq: Every day | ORAL | Status: DC
  Administered 2021-07-09 – 2021-08-24 (×47): 1 via ORAL
  Filled 2021-07-09 (×48): qty 1

## 2021-07-09 MED ORDER — CHLORHEXIDINE GLUCONATE CLOTH 2 % EX PADS
6.0000 | MEDICATED_PAD | Freq: Every day | CUTANEOUS | Status: DC
  Administered 2021-07-11 – 2021-07-12 (×2): 6 via TOPICAL

## 2021-07-09 MED ORDER — NEPRO/CARBSTEADY PO LIQD
237.0000 mL | Freq: Two times a day (BID) | ORAL | Status: DC
  Administered 2021-07-09 – 2021-07-16 (×10): 237 mL via ORAL

## 2021-07-09 MED ORDER — OXYCODONE HCL 5 MG PO TABS
5.0000 mg | ORAL_TABLET | ORAL | Status: DC | PRN
  Administered 2021-07-09 – 2021-07-10 (×4): 7.5 mg via ORAL
  Filled 2021-07-09 (×4): qty 2

## 2021-07-09 NOTE — Progress Notes (Signed)
[facility] Progress Note    Assessment/ Plan:   # ESRD: Outpatient orders: [facility], Monday Wednesday Friday, 255 minutes.  350 BFR/500 DFR  1K, 2.5 calcium, 138 sodium, EDW 135.5 kg, 15-gauge needles, machine temp 37 -Has now been tolerating UF with each treatment- removed 3 liters with HD yest - will plan for routine HD tomorrow via AVF   Acute on chronic RV failure with shock, pulmonary HTN -CTA r/o'd PE -TTE with normal EF, mod LVH and severe RV dysfunction -RHC with elevated RA pressure (22), no marked change in pressures or CO with AVF occlusion -Her AVF does not appear to be very high flow.  For now we will maintain her AVF, not ligate it.  -on midodrine 15 TID -sildenafil stopped in light of ongoing hypotension   Volume/ hypertension: EDW 135.5kg. UF with HD.  Over EDW still but making progress, achieved 3 liters with last HD-  will try again for same tomorrow with her routine HD   Anemia of Chronic Kidney Disease: Hb in 10-11s without ESA here to date.    Secondary Hyperparathyroidism/Hyperphosphatemia: sensipar 51 qtx -  phos OK on phoslo -    DM2 with hyperglycemia: mgmt per primary service     [facility]    Subjective:   HD yest-  removed 3 liters but with some low BP- required transient neo around that time but nurse thinks it was more due to fentanyl than dialysis - pt is OK with SNF placement if needed     Objective:   BP (!) 93/54   Pulse 67   Temp (!) 97.3 F (36.3 C) (Axillary)   Resp (!) 22   Ht _0  (1.651 m)   Wt (!) 137 kg   SpO2 96%   BMI 50.26 kg/m   Intake/Output Summary (Last 24 hours) at 07/09/2021 0715 Last data filed at 07/08/2021 1913 Gross per 24 hour  Intake --  Output 3000 ml  Net -3000 ml   Weight change:   Physical Exam: Gen:nad, sitting up in bed CVS:s1s2, rrr Resp: normal wob EXM:DYJWL, soft, nt KHV:[id number] bilateral LE's w/ chronic skin changes Neuro:  awake, alert, moves all ext spontaneously, speech clear and coherent HD access: lue avf +b/t   Imaging: No results found.  Labs: BMET Recent Labs  Lab 07/02/21 1321 07/04/21 0456 07/05/21 0941 07/06/21 0044 07/07/21 0442 07/08/21 0410 07/09/21 0035  NA 135 135 135 135 137 134* 135  K 4.0 5.0 4.4 4.7 3.8 4.6 4.1  CL  --  96* 95* 94* 95* 97* 98  CO2  --  21* _1 GLUCOSE  --  100* 156* 121* 126* 134* 143*  BUN  --  28* 43* 47* 26* 34* 24*  CREATININE  --  5.16* 6.26* 6.83* 4.97* 5.96* 4.64*  CALCIUM  --  8.6* 8.6* 8.8* 8.7* 8.7* 8.7*  PHOS  --   --   --   --  4.4 4.6 3.6   CBC Recent Labs  Lab 07/06/21 0044 07/07/21 0442 07/08/21 0410 07/09/21 0035  WBC 10.9* 10.6* 11.6* 10.0  HGB 10.6* 10.4* 10.6* 10.5*  HCT 32.9* 34.4* 33.4* 33.1*  MCV 98.8 102.7* 99.1 99.1  PLT 266 255 220 203    Medications:     amiodarone  200 mg Oral Daily   apixaban  5 mg Oral BID   calcium acetate  667 mg Oral TID WC   Chlorhexidine Gluconate Cloth  6 each Topical Daily   Chlorhexidine Gluconate Cloth  6 each Topical Q0600   insulin aspart  0-15 Units Subcutaneous TID WC   insulin aspart  0-5 Units Subcutaneous QHS   mouth rinse  15 mL Mouth Rinse BID   midodrine  15 mg Oral TID WC   polyethylene glycol  17 g Oral Daily   senna  1 tablet Oral Daily   sodium chloride flush  3 mL Intravenous Q12H    [facility]

## 2021-07-09 NOTE — TOC Progression Note (Signed)
Transition of Care (TOC) - Progression Note  Heart Failure   Patient Details  Name: [name] MRN: [id number] Date of Birth: 23-Mar-1966  Transition of Care [facility]) CM/SW [place], [place] Phone Number: 07/09/2021, 3:12 PM  Clinical Narrative:    CSW again Hoyle Sauer at [facility] but was unable to get in touch with anyone even after calling the facility directly. CSW needs to verify if they still have a bed available and to check the patient's HD time and place will work for the facility. CSW will continue to [employer]. CSW to start insurance authorization.  Expected Discharge Plan: [place] Barriers to Discharge: Continued Medical Work up, Ship broker  Expected Discharge Plan and Services Expected Discharge Plan: [place] In-house Referral: Clinical Social Work   Post Acute Care Choice: [place] Living arrangements for the past 2 months: Single Family Home                                       Social Determinants of Health (SDOH) Interventions Food Insecurity Interventions: Intervention Not Indicated Financial Strain Interventions: Intervention Not Indicated Housing Interventions: Intervention Not Indicated Transportation Interventions: Intervention Not Indicated  Readmission Risk Interventions Readmission Risk Prevention Plan 04/30/2019  Transportation Screening Complete  Medication Review Press photographer) Complete  PCP or Specialist appointment within 3-5 days of discharge Complete  HRI or [place] Complete  SW Recovery Care/Counseling Consult Complete  [place] Not Applicable  Some recent data might be hidden   Sherod Cisse, MSW, LCSWA [phone] Heart Failure Social Worker

## 2021-07-09 NOTE — Progress Notes (Signed)
Orthopedic Tech Progress Note Patient Details:  [name] January 26, 1966 [id number]  Ortho Devices Type of Ortho Device: Louretta Parma boot Ortho Device/Splint Location: Bi LE Ortho Device/Splint Interventions: Application   Post Interventions Patient Tolerated: Well Instructions Provided: Care of device  Heela Heishman E Pierina Schuknecht 07/09/2021, 1:45 PM

## 2021-07-09 NOTE — Progress Notes (Signed)
Physical Therapy Treatment Patient Details Name: [name] MRN: [id number] DOB: 29-Dec-1965 Today's Date: 07/09/2021    History of Present Illness 55 y.o. female presenting from [facility] ED 7/3 with lethargy, hypotension and elevated lactic acid after d/c from [facility] earlier that day with septic shock and cellulitis. Patient found to have AMS, septic shock, acute respiratory failure, acute on chronic RV failure. Transferred to ICU on 7/12 due to hypotension. PMHX significant for obesity, DMII, COPD, CAD, lymphedema, ESRD on HD MWF and CHF.    PT Comments    Pt very pleasant and very willing to mobilize after premedication. Pt lifted to chair via maxisky then able to stand from chair x 3 trials with assist of weighted stedy backward in front of pt. Pt then performed HEP and encouraged to continue to attempt HEP throughout day. Pt report maxisky more comfortable then getting up with maximove prior date. Will continue to follow.   SpO2 98% on 2L on arrival with desaturation to 88% on RA with rolling. Donned 1L for rest of session with sats 91-96% HR 73    Follow Up Recommendations  SNF     Equipment Recommendations  Hospital bed;Other (comment) (hoyer lift)    Recommendations for Other Services       Precautions / Restrictions Precautions Precautions: Fall Precaution Comments: bil LE edema    Mobility  Bed Mobility Overal bed mobility: Needs Assistance Bed Mobility: Rolling Rolling: Max assist;+2 for physical assistance         General bed mobility comments: max +2 rolling with assist of pad and rail requiring max assist to cross legs bil to roll each direction. Rolling to place maxisky pad under pt with lift from bed to chair    Transfers Overall transfer level: Needs assistance   Transfers: Sit to/from Stand Sit to Stand: Max assist         General transfer comment: Maxisky from supine to chair. Once in chair pt unable to position knees into stedy so placed fall mat on  floor in front of chair for added height due to leg length then placed stedy backward in front of pt. SPT sat in stedy to weight it for pt to pull up on stedy to stand with right knee blocked and assist of belt to stand from chair. Pt stood 10, 15, 10 sec respectively with bil UE on stedy bar with trunk flexion and full knee extension. Max +2 to scoot back into chair with reciprocal scooting and use of pad. Pt with maxisky sling in chair end of session  Ambulation/Gait             General Gait Details: unable   Stairs             Wheelchair Mobility    Modified Rankin (Stroke Patients Only)       Balance Overall balance assessment: Needs assistance Sitting-balance support: Bilateral upper extremity supported Sitting balance-Leahy Scale: Fair     Standing balance support: Bilateral upper extremity supported Standing balance-Leahy Scale: Zero Standing balance comment: knee blocked and reliant on bil UE support in standing                            Cognition Arousal/Alertness: Awake/alert Behavior During Therapy: WFL for tasks assessed/performed Overall Cognitive Status: Within Functional Limits for tasks assessed  Exercises General Exercises - Lower Extremity Long Arc Quad: AROM;15 reps;Seated;Both    General Comments        Pertinent Vitals/Pain Pain Score: 6  Pain Location: L post thigh/leg Pain Descriptors / Indicators: Guarding;Aching;Constant Pain Intervention(s): Monitored during session;Limited activity within patient's tolerance;Premedicated before session;Repositioned;Heat applied    Home Living                      Prior Function            PT Goals (current goals can now be found in the care plan section) Acute Rehab PT Goals Time For Goal Achievement: 07/23/21 Potential to Achieve Goals: Fair Progress towards PT goals: Progressing toward goals;Goals downgraded-see  care plan    Frequency    Min 2X/week      PT Plan Current plan remains appropriate    Co-evaluation              AM-PAC PT "6 Clicks" Mobility   Outcome Measure  Help needed turning from your back to your side while in a flat bed without using bedrails?: Total Help needed moving from lying on your back to sitting on the side of a flat bed without using bedrails?: Total Help needed moving to and from a bed to a chair (including a wheelchair)?: Total Help needed standing up from a chair using your arms (e.g., wheelchair or bedside chair)?: A Lot Help needed to walk in hospital room?: Total Help needed climbing 3-5 steps with a railing? : Total 6 Click Score: 7    End of Session Equipment Utilized During Treatment: Gait belt;Oxygen Activity Tolerance: Patient tolerated treatment well Patient left: in chair;with call bell/phone within reach Nurse Communication: Mobility status;Need for lift equipment PT Visit Diagnosis: Other abnormalities of gait and mobility (R26.89);Muscle weakness (generalized) (M62.81);Pain     Time: 4098-1191 PT Time Calculation (min) (ACUTE ONLY): 34 min  Charges:  $Therapeutic Activity: 23-37 mins                     Trenden Hazelrigg P, PT Acute Rehabilitation Services Pager: [phone] Office: [name] 07/09/2021, 1:25 PM

## 2021-07-09 NOTE — Progress Notes (Addendum)
Patient ID: [name], female   DOB: 1966-07-13, 55 y.o.   MRN: [id number]      Advanced Heart Failure Rounding Note  PCP-Cardiologist: Dr. Ross/ Dr. Aundra Dubin    Patient Profile:    56 y/o female w/ chronic diastolic CHF with prominent RV failure, pulmonary HTN, ESRD on HD admitted w/ septic shock 2/2 LE cellulitis. Septic shock resolved. Now off NE. Completed abx.   Pt seen in consultation by Dr. Aundra Dubin earlier this admit (see notes for full details). AHF team asked to revisit given issues w/ hypotension limiting HD. Was transferred back to ICU 7/12 for profound hypotension and tachycardia w/ HD and started on phenylephrine. PE has been ruled out. AFL captured on tele. Thought to be high output PH primarily in setting of excessive flow through the large left arm AVF. ?Need for fistula ligation. We are asked to assist w/ RHC w/ fistual occlusion to help better assess.   Subjective:    RHC done 7/14 with numbers obtained before and after occluding the left arm AVF with BP cuff inflation (heparin bolus given).   RA 22 RV 65/31 PA 68/37, mean 55 PCWP mean 14 Oxygen saturations: PA 62% AO 95% Cardiac Output (Fick) 4.9  Cardiac Index (Fick) 2.09 PVR 8.3 WU Cardiac Output (Thermo) 6.85 Cardiac Index (Thermo) 2.81 PVR 6.0 WU  AFTER OCCLUSION OF AV FISTULA:  PA 66/41, mean 53 Cardiac Output (Thermo) 7.05  7/19 [name] restarted.   She is currently off [name]. On Midodrine 15 tid. SBP low 100s.   Had HD yesterday, 3L removed.   Feels good today. Slept very well last PM. Appetite is good.    Objective:   Weight Range: (!) 137 kg Body mass index is 50.26 kg/m.   Vital Signs:   Temp:  [96.8 F (36 C)-97.8 F (36.6 C)] 97.3 F (36.3 C) (07/21 0355) Pulse Rate:  [62-79] 67 (07/21 0600) Resp:  [14-26] 22 (07/21 0600) BP: (79-121)/(28-85) 93/54 (07/21 0600) SpO2:  [88 %-100 %] 96 % (07/21 0600) Weight:  [136.5 kg-140 kg] 137 kg (07/21 0500) Last BM Date: 07/09/21  Weight  change: Filed Weights   07/08/21 1538 07/08/21 1913 07/09/21 0500  Weight: (!) 140 kg (!) 136.5 kg (!) 137 kg    Intake/Output:   Intake/Output Summary (Last 24 hours) at 07/09/2021 0730 Last data filed at 07/08/2021 1913 Gross per 24 hour  Intake --  Output 3000 ml  Net -3000 ml      Physical Exam   General:  Well appearing, obese. No respiratory difficulty HEENT: normal Neck: supple.  JVD to jaw Carotids 2+ bilat; no bruits. No lymphadenopathy or thyromegaly appreciated. Cor: PMI nondisplaced. Regular rate & rhythm. No rubs, gallops or murmurs. Lungs: clear Abdomen: obese soft, nontender, nondistended. No hepatosplenomegaly. No bruits or masses. Good bowel sounds. Extremities: no cyanosis, clubbing, rash, 1+ bilateral LE edema + unna boots  Neuro: alert & oriented x 3, cranial nerves grossly intact. moves all 4 extremities w/o difficulty. Affect pleasant.   Telemetry   NSR 80s  personally reviewed.   Labs    CBC Recent Labs    07/08/21 0410 07/09/21 0035  WBC 11.6* 10.0  HGB 10.6* 10.5*  HCT 33.4* 33.1*  MCV 99.1 99.1  PLT 220 427   Basic Metabolic Panel Recent Labs    07/08/21 0410 07/09/21 0035 07/09/21 0616  NA 134* 135  --   K 4.6 4.1  --   CL 97* 98  --   CO2  27 26  --   GLUCOSE 134* 143*  --   BUN 34* 24*  --   CREATININE 5.96* 4.64*  --   CALCIUM 8.7* 8.7*  --   MG  --   --  2.1  PHOS 4.6 3.6  --    Liver Function Tests Recent Labs    07/08/21 0410 07/09/21 0035  ALBUMIN 2.8* 3.1*    No results for input(s): LIPASE, AMYLASE in the last 72 hours. Cardiac Enzymes No results for input(s): CKTOTAL, CKMB, CKMBINDEX, TROPONINI in the last 72 hours.   BNP: BNP (last 3 results) Recent Labs    06/22/21 1400 06/29/21 1534 07/01/21 0550  BNP 590.8* 718.9* 2,062.9*    ProBNP (last 3 results) No results for input(s): PROBNP in the last 8760 hours.   D-Dimer No results for input(s): DDIMER in the last 72 hours.  Hemoglobin A1C No  results for input(s): HGBA1C in the last 72 hours.  Fasting Lipid Panel No results for input(s): CHOL, HDL, LDLCALC, TRIG, CHOLHDL, LDLDIRECT in the last 72 hours. Thyroid Function Tests No results for input(s): TSH, T4TOTAL, T3FREE, THYROIDAB in the last 72 hours.  Invalid input(s): FREET3   Other results:   Imaging    No results found.   Medications:     Scheduled Medications:  amiodarone  200 mg Oral Daily   apixaban  5 mg Oral BID   calcium acetate  667 mg Oral TID WC   Chlorhexidine Gluconate Cloth  6 each Topical Daily   Chlorhexidine Gluconate Cloth  6 each Topical Q0600   insulin aspart  0-15 Units Subcutaneous TID WC   insulin aspart  0-5 Units Subcutaneous QHS   mouth rinse  15 mL Mouth Rinse BID   midodrine  15 mg Oral TID WC   polyethylene glycol  17 g Oral Daily   senna  1 tablet Oral Daily   sodium chloride flush  3 mL Intravenous Q12H    Infusions:  sodium chloride     albumin human     phenylephrine ([name]-SYNEPHRINE) Adult infusion 25 mcg/min (07/08/21 0810)   promethazine (PHENERGAN) injection (IM or IVPB) Stopped (07/02/21 1831)    PRN Medications: acetaminophen, diphenhydrAMINE, docusate sodium, fentaNYL (SUBLIMAZE) injection, lactulose, ondansetron (ZOFRAN) IV, oxyCODONE, polyethylene glycol, promethazine (PHENERGAN) injection (IM or IVPB)    Assessment/Plan   1. Acute on chronic diastolic CHF with prominent RV failure: Echo as above with EF 60-65%, moderate LVH, mod-severe RV dysfunction with severe RVE, PASP 69, mod-severe TR, dilated IVC. This is very similar to prior echo from 2018 with RV looking mildly worse.  She has required [name] for BP support, but this is currently off w/ SBPs in low 100s, also midodrine 15 tid. RV failure with severe pulmonary hypertension, RA pressure 22 on RHC on 7/14.  - HD for volume management.   2. Pulmonary hypertension: RHC in 8/18 showed severe pulmonary hypertension with relatively low PVR (3.0 WU) and high  cardiac output. This was thought to be high output PH primarily in setting of excessive flow through the large left arm AVF. She was not thought to be a candidate for selective pulmonary vasodilators.  PA systolic pressure by echo is similar this admission (though mildly lower) to the echo from 2018.  Workup for PE was negative this admission. RHC this admission showed pulmonary arterial hypertension, cardiac output not significantly elevated.  Occluding the AV fistula for 5 minutes showed no change in cardiac output or PA pressure.  The study was  not consistent with high output HF with pulmonary hypertension from large AV fistula.  It is probable that her PAH is from OHS/OSA.  - continue to hold sildenafil for now with low BP requiring neo, will try to restart eventually.   - Continue midodrine 15 mg tid.  - Per Dr Aundra Dubin not felt to benefit from ligation of the fistula based on the RHC.  - Suspect underlying OSA. Reports h/o snoring. Will need outpatient sleep study.  3. Shock: Suspect primarily septic shock from cellulitis initially but RV failure is likely the primary etiology now.  - Continue midodrine 15 mg tid - Long-term concerning prognosis if we are not able to remove fluid via dialysis without pressor as she has significant RV failure.  4. ID: Cellulitis.  Completed abx course w/ daptomycin 5. Aflutter w/ RVR: Atypical,  - s/p DC-CV on 7/15. Remains in SR. - Amio 200 mg daily.  - Continue Eliquis.  6. Renal: ESRD.   - HD per nephrology.  7. Dysphagia  - SLP following  8. Disposition plan for SNF at d/c   Lindsay Jester PA-C  07/09/2021 7:30 AM  Patient seen with NP, agree with the above note.   She remains in NSR today.  She is now off phenylephrine and on midodrine 15 mg tid. SBP 100s today.  She had HD yesterday, weight down.    General: NAD Neck: JVP 10-12 cm, no thyromegaly or thyroid nodule.  Lungs: Clear to auscultation bilaterally with normal respiratory effort. CV:  Nondisplaced PMI.  Heart regular S1/S2, no S3/S4, no murmur.  1+ ankle edema.   Abdomen: Soft, nontender, no hepatosplenomegaly, no distention.  Skin: Intact without lesions or rashes.  Neurologic: Alert and oriented x 3.  Psych: Normal affect. Extremities: No clubbing or cyanosis.  HEENT: Normal.     Still with volume overload/RV failure.  Now off phenylephrine. - Continue midodrine 15 tid.  - Needs ongoing volume removal, HD tomorrow. Hopefully can continue HD without pressor.   In NSR on po amiodarone now. - Continue Eliquis 5 mg bid. - Decreased amiodarone to qd.    As above, RHC was not suggestive of high output HF/pulmonary hypertension.  Would hold off on AVF ligation as I do not think it will be helpful.  She has PAH likely due to OSA/OSH.  BP too marginal at this point for sildenafil trial.   Mobilize with PT. Will need SNF when stable (would make sure she tolerates 1 more HD off phenylephrine).    [name] 07/09/2021 7:42 AM

## 2021-07-09 NOTE — Progress Notes (Signed)
Initial Nutrition Assessment  DOCUMENTATION CODES:   Not applicable  INTERVENTION:   Nepro Shake po BID, each supplement provides 425 kcal and 19 grams protein Renal MVI daily   NUTRITION DIAGNOSIS:   Increased nutrient needs related to wound healing as evidenced by estimated needs.  GOAL:   Patient will meet greater than or equal to 90% of their needs  MONITOR:   PO intake, Supplement acceptance, Weight trends, I & O's, Labs  REASON FOR ASSESSMENT:   Rounds    ASSESSMENT:   Patient with PMH significant for thyroid cancer, CHF, Chiari malformation, ESRD/HD, COPD, CAD, HLD, HTN, DM, and lymphedema. Presents this admission with cardiogenic shock from RV failure.  7/15- s/p DC cardioversion   Having issues weaning from pressor during HD treatment.   Prior to admission patient experienced decreased appetite x 1 month due to feelings of lethargy. States during this time she consumed 2-3 small meals vs her normal 3 meals daily. She speaks with the outpatient RD from HD center daily and has a close relationship with her. Patient aware that she requires increased protein given wounds and HD. Appetite this admission has progressed. Meal completion charted as 75-100% for her last eight meals. Patient willing to have Nepro shakes.   Patient was recently put on binder therapy per outpatient HD center and has taken them as prescribed.   She reports her EDW was at 135 kg prior to admission and is unsure of dry weight loss. Records indicate patient has maintained her weight since 11/04/20.   Medications: phoslo, SS novolog, miralax Labs: CBG 108-158   NUTRITION - FOCUSED PHYSICAL EXAM:  Flowsheet Row Most Recent Value  Orbital Region No depletion  Upper Arm Region No depletion  Thoracic and Lumbar Region Unable to assess  Buccal Region No depletion  Temple Region No depletion  Clavicle Bone Region No depletion  Clavicle and Acromion Bone Region No depletion  Scapular Bone  Region Unable to assess  Dorsal Hand No depletion  Patellar Region Unable to assess  Anterior Thigh Region Unable to assess  Posterior Calf Region Unable to assess  Edema (RD Assessment) Moderate  [BLE]  Hair Reviewed  Eyes Reviewed  Mouth Reviewed  Skin Reviewed  Nails Reviewed      Diet Order:   Diet Order             Diet heart healthy/carb modified Room service appropriate? Yes; Fluid consistency: Thin; Fluid restriction: 1200 mL Fluid  Diet effective now                   EDUCATION NEEDS:   Education needs have been addressed  Skin:  Skin Assessment: Skin Integrity Issues: Skin Integrity Issues:: Stage II Stage II: R thigh  Last BM:  7/21  Height:   Ht Readings from Last 1 Encounters:  06/30/21 _0  (1.651 m)    Weight:   Wt Readings from Last 1 Encounters:  07/09/21 (!) 137 kg    BMI:  Body mass index is 50.26 kg/m.  Estimated Nutritional Needs:   Kcal:  1800-2000 kcal  Protein:  90-105 grams  Fluid:  >/= 1.8 L/day  Lindsay Single MS, RD, LDN, CNSC Clinical Nutrition Pager listed in Short

## 2021-07-09 NOTE — Progress Notes (Signed)
NAME:  GRISELLE RUFER, MRN:  [id number], DOB:  1966-05-13, LOS: 69 ADMISSION DATE:  06/21/2021, CONSULTATION DATE:  06/21/21  REFERRING MD:  Melina Copa, ED, CHIEF COMPLAINT:  lethargy    History of Present Illness:  Ms Shimkus is a 55 year old woman with hx of obesity, lymphedema, ESRD on HD MWF, CHF, recently hospitalized at [facility] for cellulitis, discharged 7/3, returning back same day with lethargy, hypotension and elevated lactic acid.  She reported her family was worried about her after d/c because she was sleeping a lot, called to have her brought back to hospital.  No change in pain /tenderness to RLE.   In ED: LR bolus 1.8, doxycycline, cefepime, flagyl given. Admitted for evaluation of hypotension.  She transferred out to the hospitalist service on 7/5.  PCCM called back to see patient for hypotension and tachycardia on 7/12. PE ruled out. Found to have AF/flutter, acute on chronic diastolic CHF with RV failure.  Concern for sepsis resolved but remaining shock thought secondary to RV failure.  Pertinent  Medical History  Anemia Thyroid cancer Diastolic CHF Chiari malformation ESRD on HD M/W/F COPD?  CAD HLD  HTN DM Lymphedema  Significant Hospital Events: Including procedures, antibiotic start and stop dates in addition to other pertinent events   7/3 admitted to ICU on vasopressors, tachyarrhythmias overnight. Switched from NE> neosynephrine. On heparin. 7/4 Echo with acute on subacute R heart failure, LVH with no RWMA. Trop 3500.  7/5 transferred from ICU to hospitalist service 7/12 called back to see pt re: hypotension (normal lactate) and tachycardia 7/14 BP down overnight, in ST.  RHC with fistula occlusion did not support diagnosis of high output failure.  7/18 neo stopped. Getting first round of iHD.  Hypotensive during dialysis, had to be placed back on low-dose phenylephrine but did get 4 L of fluid off 7/20 remains on low dose neo  Interim History / Subjective:  iHD  yesterday pulled over 3 L off Up in chair for 3 hours yesterday Off neo since last night  This morning patient doing well. No complaints. BP stable remains off [name]  Objective   Blood pressure (!) 93/54, pulse 67, temperature 97.6 F (36.4 C), temperature source Oral, resp. rate (!) 22, height _0  (1.651 m), weight (!) 137 kg, SpO2 96 %.        Intake/Output Summary (Last 24 hours) at 07/09/2021 0949 Last data filed at 07/08/2021 1913 Gross per 24 hour  Intake --  Output 3000 ml  Net -3000 ml    Filed Weights   07/08/21 1538 07/08/21 1913 07/09/21 0500  Weight: (!) 140 kg (!) 136.5 kg (!) 137 kg    Examination: General:  NAD HEENT: MM pink/moist; [place] in place Neuro: aox3 CV: s1s2, RRR, no m/r/g PULM:  dim clear bs bilaterally; 2 liters [place] GI: soft, bsx4 active  Extremities: warm/dry, BLE wrapped in dressing Skin: no rashes or lesions   Resolved Hospital Problem list   Sepsis - completed course daptomycin for cellulitis  RLE Cellulitis   Assessment & Plan:   Critically ill due to cardiogenic shock from RV failure due to group 3 Pulmonary hypertension from untreated sleep apnea. P: -HF following -continue midodrine -Off neo since last night -sildenafil on hold due to hypotension -continue iHD  Intermittent Atypical aflutter now converted to sinus: S/p DC cardioversion 7/15 P: -continue amio and eliquis -telemetry monitoring  ESRD P: -Nephro following -continue iHD -trend BMP  Suspected OSA P: -continue CPAP QHS -will  need follow up at discharge for CPAP at home and sleep study  Esophageal Dysphagia  P: -Dysphagia precautions -PPI  DM2 P: -SSI and CBG    Best Practice (right click and "Reselect all SmartList Selections" daily)  Diet/type: Regular consistency (see orders) DVT prophylaxis: DOAC GI prophylaxis: PPI Lines: N/A Foley:  N/A   Code Status: Full Code  Critical care time: 35 minutes  JD Rexene Agent [facility] 07/09/2021, 9:49 AM  Please see [email or web address] for pager details.  From 7A-7P if no response, please call [phone]. After hours, please call ELink [phone].

## 2021-07-10 DIAGNOSIS — R570 Cardiogenic shock: Secondary | ICD-10-CM | POA: Diagnosis not present

## 2021-07-10 DIAGNOSIS — I5033 Acute on chronic diastolic (congestive) heart failure: Secondary | ICD-10-CM | POA: Diagnosis not present

## 2021-07-10 DIAGNOSIS — N186 End stage renal disease: Secondary | ICD-10-CM | POA: Diagnosis not present

## 2021-07-10 LAB — RENAL FUNCTION PANEL
Albumin: 2.9 g/dL — ABNORMAL LOW (ref 3.5–5.0)
Albumin: 3.2 g/dL — ABNORMAL LOW (ref 3.5–5.0)
Anion gap: 14 (ref 5–15)
Anion gap: 9 (ref 5–15)
BUN: 33 mg/dL — ABNORMAL HIGH (ref 6–20)
BUN: 17 mg/dL (ref 6–20)
CO2: 26 mmol/L (ref 22–32)
CO2: 27 mmol/L (ref 22–32)
Calcium: 8.7 mg/dL — ABNORMAL LOW (ref 8.9–10.3)
Calcium: 8.8 mg/dL — ABNORMAL LOW (ref 8.9–10.3)
Chloride: 95 mmol/L — ABNORMAL LOW (ref 98–111)
Chloride: 97 mmol/L — ABNORMAL LOW (ref 98–111)
Creatinine, Ser: 5.79 mg/dL — ABNORMAL HIGH (ref 0.44–1.00)
Creatinine, Ser: 3.72 mg/dL — ABNORMAL HIGH (ref 0.44–1.00)
GFR, Estimated: 8 mL/min — ABNORMAL LOW (ref >60)
GFR, Estimated: 14 mL/min — ABNORMAL LOW (ref >60)
Glucose, Bld: 149 mg/dL — ABNORMAL HIGH (ref 70–99)
Glucose, Bld: 144 mg/dL — ABNORMAL HIGH (ref 70–99)
Phosphorus: 4.1 mg/dL (ref 2.5–4.6)
Phosphorus: 3.1 mg/dL (ref 2.5–4.6)
Potassium: 4.5 mmol/L (ref 3.5–5.1)
Potassium: 4 mmol/L (ref 3.5–5.1)
Sodium: 135 mmol/L (ref 135–145)
Sodium: 133 mmol/L — ABNORMAL LOW (ref 135–145)

## 2021-07-10 LAB — GLUCOSE, CAPILLARY
Glucose-Capillary: 153 mg/dL — ABNORMAL HIGH (ref 70–99)
Glucose-Capillary: 149 mg/dL — ABNORMAL HIGH (ref 70–99)
Glucose-Capillary: 160 mg/dL — ABNORMAL HIGH (ref 70–99)
Glucose-Capillary: 126 mg/dL — ABNORMAL HIGH (ref 70–99)

## 2021-07-10 LAB — CBC
HCT: 30.9 % — ABNORMAL LOW (ref 36.0–46.0)
HCT: 31 % — ABNORMAL LOW (ref 36.0–46.0)
Hemoglobin: 9.8 g/dL — ABNORMAL LOW (ref 12.0–15.0)
Hemoglobin: 9.7 g/dL — ABNORMAL LOW (ref 12.0–15.0)
MCH: 31.4 pg (ref 26.0–34.0)
MCH: 30.9 pg (ref 26.0–34.0)
MCHC: 31.7 g/dL (ref 30.0–36.0)
MCHC: 31.3 g/dL (ref 30.0–36.0)
MCV: 99 fL (ref 80.0–100.0)
MCV: 98.7 fL (ref 80.0–100.0)
Platelets: 194 10*3/uL (ref 150–400)
Platelets: 178 10*3/uL (ref 150–400)
RBC: 3.12 MIL/uL — ABNORMAL LOW (ref 3.87–5.11)
RBC: 3.14 MIL/uL — ABNORMAL LOW (ref 3.87–5.11)
RDW: 18 % — ABNORMAL HIGH (ref 11.5–15.5)
RDW: 17.7 % — ABNORMAL HIGH (ref 11.5–15.5)
WBC: 9.9 10*3/uL (ref 4.0–10.5)
WBC: 10.1 10*3/uL (ref 4.0–10.5)
nRBC: 0.2 % (ref 0.0–0.2)
nRBC: 0 % (ref 0.0–0.2)

## 2021-07-10 MED ORDER — PHENYLEPHRINE HCL-NACL 10-0.9 MG/250ML-% IV SOLN: 0.0000 ug/min | INTRAVENOUS | Status: DC

## 2021-07-10 MED ORDER — FENTANYL CITRATE (PF) 100 MCG/2ML IJ SOLN
25.0000 ug | INTRAMUSCULAR | Status: DC | PRN
  Administered 2021-07-10: 50 ug via INTRAVENOUS
  Administered 2021-07-10: 25 ug via INTRAVENOUS
  Administered 2021-07-13 – 2021-07-16 (×2): 50 ug via INTRAVENOUS
  Filled 2021-07-10 (×4): qty 2

## 2021-07-10 MED ORDER — SODIUM CHLORIDE 0.9 % IV SOLN: 250.0000 mL | INTRAVENOUS | Status: DC

## 2021-07-10 MED ORDER — DULOXETINE HCL 20 MG PO CPEP
20.0000 mg | ORAL_CAPSULE | Freq: Every day | ORAL | Status: DC
  Administered 2021-07-10 – 2021-08-24 (×43): 20 mg via ORAL
  Filled 2021-07-10 (×46): qty 1

## 2021-07-10 MED ORDER — HEPARIN SODIUM (PORCINE) 1000 UNIT/ML DIALYSIS
20.0000 [IU]/kg | INTRAMUSCULAR | Status: DC | PRN
  Filled 2021-07-10: qty 3

## 2021-07-10 MED ORDER — PHENYLEPHRINE HCL-NACL 10-0.9 MG/250ML-% IV SOLN: 25.0000 ug/min | INTRAVENOUS | Status: DC

## 2021-07-10 MED ORDER — OXYCODONE HCL 5 MG PO TABS
5.0000 mg | ORAL_TABLET | ORAL | Status: DC | PRN
  Administered 2021-07-10 – 2021-07-15 (×12): 10 mg via ORAL
  Administered 2021-07-16: 5 mg via ORAL
  Administered 2021-07-17 – 2021-07-20 (×8): 10 mg via ORAL
  Filled 2021-07-10 (×19): qty 2
  Filled 2021-07-10: qty 1
  Filled 2021-07-10: qty 2

## 2021-07-10 NOTE — Plan of Care (Signed)

## 2021-07-10 NOTE — Progress Notes (Signed)
PT Cancellation Note  Patient Details Name: [name] MRN: [id number] DOB: Nov 24, 1966   Cancelled Treatment:    Reason Eval/Treat Not Completed: Patient declined, no reason specified (pt post HD and reports pain is too intense to participate at this time. offered to return in 30 min and pt continues to decline)   Davarius Ridener B Zyere Jiminez 07/10/2021, 12:04 PM Bayard Males, PT Acute Rehabilitation Services Pager: [phone] Office: [phone]

## 2021-07-10 NOTE — Progress Notes (Signed)
[facility] Progress Note    Assessment/ Plan:   # ESRD: Outpatient orders: [facility], Monday Wednesday Friday, 255 minutes.  350 BFR/500 DFR  1K, 2.5 calcium, 138 sodium, EDW 135.5 kg, 15-gauge needles, machine temp 37 -Has now been tolerating UF with each treatment- removed 3 liters with HD yest - will plan for routine HD today via AVF   Acute on chronic RV failure with shock, pulmonary HTN -CTA r/o'd PE -TTE with normal EF, mod LVH and severe RV dysfunction -RHC with elevated RA pressure (22), no marked change in pressures or CO with AVF occlusion -Her AVF does not appear to be very high flow.  For now we will maintain her AVF, not ligate it.  -on midodrine 15 TID -sildenafil stopped in light of ongoing hypotension UF as able with HD-  using low temp- albumin and profile to get volume off   Volume/ hypertension: EDW 135.5kg. UF with HD.  Over EDW still but making progress, achieved 3 liters with last HD-  will try again for same today with her routine HD-  I really am hopeful she will not need pressor   Anemia of Chronic Kidney Disease: Hb in 10-11s without ESA here to date.    Secondary Hyperparathyroidism/Hyperphosphatemia: sensipar 60 qtx -  phos OK on phoslo -    DM2 with hyperglycemia: mgmt per primary service     [facility]    Subjective:   Seen on HD-  goal was set for 1 liter and lowest BP was 98 ?  I increased to 3 liters-  pt is c/o fibromyalgia but otherwise OK-  everyone is concerned about needing to restart neo     Objective:   BP 98/61 (BP Location: Right Wrist)   Pulse 73   Temp 98.7 F (37.1 C)   Resp (!) 21   Ht _0  (1.651 m)   Wt (!) 137 kg   SpO2 96%   BMI 50.26 kg/m  No intake or output data in the 24 hours ending 07/10/21 0811  Weight change:   Physical Exam: Gen:nad, sitting up in bed CVS:s1s2, rrr Resp: normal wob OIB:BCWUG, soft, nt QBV:[id number] bilateral LE's w/ chronic  skin changes Neuro: awake, alert, moves all ext spontaneously, speech clear and coherent HD access: lue avf +b/t   Imaging: No results found.  Labs: BMET Recent Labs  Lab 07/04/21 0456 07/05/21 0941 07/06/21 0044 07/07/21 0442 07/08/21 0410 07/09/21 0035 07/10/21 0043  NA 135 135 135 137 134* 135 135  K 5.0 4.4 4.7 3.8 4.6 4.1 4.5  CL 96* 95* 94* 95* 97* 98 95*  CO2 21* _1 GLUCOSE 100* 156* 121* 126* 134* 143* 149*  BUN 28* 43* 47* 26* 34* 24* 33*  CREATININE 5.16* 6.26* 6.83* 4.97* 5.96* 4.64* 5.79*  CALCIUM 8.6* 8.6* 8.8* 8.7* 8.7* 8.7* 8.7*  PHOS  --   --   --  4.4 4.6 3.6 4.1   CBC Recent Labs  Lab 07/07/21 0442 07/08/21 0410 07/09/21 0035 07/10/21 0043  WBC 10.6* 11.6* 10.0 9.9  HGB 10.4* 10.6* 10.5* 9.8*  HCT 34.4* 33.4* 33.1* 30.9*  MCV 102.7* 99.1 99.1 99.0  PLT 255 220 203 194    Medications:     amiodarone  200 mg Oral Daily   apixaban  5 mg Oral BID   calcium acetate  667 mg Oral TID WC   Chlorhexidine Gluconate Cloth  6 each Topical  Daily   Chlorhexidine Gluconate Cloth  6 each Topical Q0600   Chlorhexidine Gluconate Cloth  6 each Topical Q0600   feeding supplement (NEPRO CARB STEADY)  237 mL Oral BID BM   insulin aspart  0-15 Units Subcutaneous TID WC   insulin aspart  0-5 Units Subcutaneous QHS   mouth rinse  15 mL Mouth Rinse BID   midodrine  15 mg Oral TID WC   multivitamin  1 tablet Oral QHS   polyethylene glycol  17 g Oral Daily   senna  1 tablet Oral Daily   sodium chloride flush  3 mL Intravenous Q12H    [facility]

## 2021-07-10 NOTE — Progress Notes (Signed)
OT Cancellation Note  Patient Details Name: ABBYGAEL CURTISS MRN: [id number] DOB: [birth year]   Cancelled Treatment:    Reason Eval/Treat Not Completed: Patient at procedure or test/ unavailable- pt in HD.  Will follow and see as able.  Jolaine Artist, OT Acute Rehabilitation Services Pager [phone] Office [phone]   Delight Stare 07/10/2021, 9:24 AM

## 2021-07-10 NOTE — Progress Notes (Signed)
Occupational Therapy Treatment Patient Details Name: [name] MRN: [id number] DOB: 08/27/1966 Today's Date: 07/10/2021    History of present illness 55 y.o. female presenting from [facility] ED 7/3 with lethargy, hypotension and elevated lactic acid after d/c from [facility] earlier that day with septic shock and cellulitis. Patient found to have AMS, septic shock, acute respiratory failure, acute on chronic RV failure. Transferred to ICU on 7/12 due to hypotension. PMHX significant for obesity, DMII, COPD, CAD, lymphedema, ESRD on HD MWF and CHF.   OT comments  Patient supine in bed and agreeable to bed level exercises.  Completed as below, with cueing for pursed lip breathing and pacing during exercises.  Theraband exercises with supervision, reports completing exercises 2-3x/day.  Plan for OOB ADL next session.     Follow Up Recommendations  SNF;Supervision/Assistance - 24 hour    Equipment Recommendations  Other (comment) (defer to next level of care)    Recommendations for Other Services      Precautions / Restrictions Precautions Precautions: Fall Precaution Comments: bil LE edema       Mobility Bed Mobility                    Transfers                      Balance                                           ADL either performed or assessed with clinical judgement   ADL                                               Vision       Perception     Praxis      Cognition Arousal/Alertness: Awake/alert Behavior During Therapy: WFL for tasks assessed/performed Overall Cognitive Status: Within Functional Limits for tasks assessed                                          Exercises Exercises: Other exercises Other Exercises Other Exercises: level 2 theraband exercises x 10 reps 1 set: foward press, overhead press, and diagonals; AROM hand flexion/extension and ankle pumps and with 5 second hold  retraction, quad sets   Shoulder Instructions       General Comments      Pertinent Vitals/ Pain       Pain Assessment: Faces Faces Pain Scale: Hurts even more Pain Location: generalized Pain Descriptors / Indicators: Aching Pain Intervention(s): Monitored during session;Limited activity within patient's tolerance;Repositioned;Premedicated before session  Home Living                                          Prior Functioning/Environment              Frequency  Min 2X/week        Progress Toward Goals  OT Goals(current goals can now be found in the care plan section)  Progress towards OT goals: Progressing toward goals  Acute Rehab OT Goals  Patient Stated Goal: get stronger and return home OT Goal Formulation: With patient  Plan Discharge plan remains appropriate;Frequency remains appropriate    Co-evaluation                 AM-PAC OT "6 Clicks" Daily Activity     Outcome Measure   Help from another person eating meals?: None Help from another person taking care of personal grooming?: A Little Help from another person toileting, which includes using toliet, bedpan, or urinal?: Total Help from another person bathing (including washing, rinsing, drying)?: Total Help from another person to put on and taking off regular upper body clothing?: A Little Help from another person to put on and taking off regular lower body clothing?: Total 6 Click Score: 13    End of Session Equipment Utilized During Treatment: Oxygen (2L)  OT Visit Diagnosis: Other abnormalities of gait and mobility (R26.89);Muscle weakness (generalized) (M62.81);Pain Pain - Right/Left: Right Pain - part of body:  (generalized)   Activity Tolerance Patient limited by pain   Patient Left in bed;with call bell/phone within reach;with bed alarm set   Nurse Communication Mobility status        Time: 3785-8850 OT Time Calculation (min): 21 min  Charges: OT  Treatments $Therapeutic Exercise: 8-22 mins  Jolaine Artist, OT [place] Pager [phone] Office [place] 07/10/2021, 1:41 PM

## 2021-07-10 NOTE — Progress Notes (Signed)
Patient ID: [name], female   DOB: [birth year], 55 y.o.   MRN: [id number]    Advanced Heart Failure Rounding Note  PCP-Cardiologist: Dr. Ross/ Dr. Aundra Dubin    Patient Profile:    55 y/o female w/ chronic diastolic CHF with prominent RV failure, pulmonary HTN, ESRD on HD admitted w/ septic shock 2/2 LE cellulitis. Septic shock resolved. Now off NE. Completed abx.   Pt seen in consultation by Dr. Aundra Dubin earlier this admit (see notes for full details). AHF team asked to revisit given issues w/ hypotension limiting HD. Was transferred back to ICU 7/12 for profound hypotension and tachycardia w/ HD and started on phenylephrine. PE has been ruled out. AFL captured on tele. Thought to be high output PH primarily in setting of excessive flow through the large left arm AVF. ?Need for fistula ligation. We are asked to assist w/ RHC w/ fistual occlusion to help better assess.   Subjective:    RHC done 7/14 with numbers obtained before and after occluding the left arm AVF with BP cuff inflation (heparin bolus given).   RA 22 RV 65/31 PA 68/37, mean 55 PCWP mean 14 Oxygen saturations: PA 62% AO 95% Cardiac Output (Fick) 4.9  Cardiac Index (Fick) 2.09 PVR 8.3 WU Cardiac Output (Thermo) 6.85 Cardiac Index (Thermo) 2.81 PVR 6.0 WU  AFTER OCCLUSION OF AV FISTULA:  PA 66/41, mean 53 Cardiac Output (Thermo) 7.05  She remains off phenylephrine. On Midodrine 15 tid. SBP 90s-100s.   HD started this morning.   She complains of a fibromyalgia flare.     Objective:   Weight Range: (!) 137 kg Body mass index is 50.26 kg/m.   Vital Signs:   Temp:  [97.6 F (36.4 C)-98.7 F (37.1 C)] 98 F (36.7 C) (07/22 0742) Pulse Rate:  [65-83] 73 (07/22 0600) Resp:  [14-26] 23 (07/22 0600) BP: (92-135)/(48-76) 94/52 (07/22 0600) SpO2:  [90 %-100 %] 95 % (07/22 0600) Last BM Date: 07/09/21  Weight change: Filed Weights   07/08/21 1538 07/08/21 1913 07/09/21 0500  Weight: (!) 140 kg (!) 136.5  kg (!) 137 kg    Intake/Output:  No intake or output data in the 24 hours ending 07/10/21 0756     Physical Exam   General: NAD Neck: JVP 14 cm, no thyromegaly or thyroid nodule.  Lungs: Clear to auscultation bilaterally with normal respiratory effort. CV: Nondisplaced PMI.  Heart regular S1/S2, no S3/S4, no murmur.  1+ ankle edema.  Abdomen: Soft, nontender, no hepatosplenomegaly, no distention.  Skin: Intact without lesions or rashes.  Neurologic: Alert and oriented x 3.  Psych: Normal affect. Extremities: No clubbing or cyanosis.  HEENT: Normal.    Telemetry   NSR 80s  personally reviewed.   Labs    CBC Recent Labs    07/09/21 0035 07/10/21 0043  WBC 10.0 9.9  HGB 10.5* 9.8*  HCT 33.1* 30.9*  MCV 99.1 99.0  PLT 203 997   Basic Metabolic Panel Recent Labs    07/09/21 0035 07/09/21 0616 07/10/21 0043  NA 135  --  135  K 4.1  --  4.5  CL 98  --  95*  CO2 26  --  26  GLUCOSE 143*  --  149*  BUN 24*  --  33*  CREATININE 4.64*  --  5.79*  CALCIUM 8.7*  --  8.7*  MG  --  2.1  --   PHOS 3.6  --  4.1   Liver Function Tests Recent Labs  07/09/21 0035 07/10/21 0043  ALBUMIN 3.1* 2.9*    No results for input(s): LIPASE, AMYLASE in the last 72 hours. Cardiac Enzymes No results for input(s): CKTOTAL, CKMB, CKMBINDEX, TROPONINI in the last 72 hours.   BNP: BNP (last 3 results) Recent Labs    06/22/21 1400 06/29/21 1534 07/01/21 0550  BNP 590.8* 718.9* 2,062.9*    ProBNP (last 3 results) No results for input(s): PROBNP in the last 8760 hours.   D-Dimer No results for input(s): DDIMER in the last 72 hours.  Hemoglobin A1C No results for input(s): HGBA1C in the last 72 hours.  Fasting Lipid Panel No results for input(s): CHOL, HDL, LDLCALC, TRIG, CHOLHDL, LDLDIRECT in the last 72 hours. Thyroid Function Tests No results for input(s): TSH, T4TOTAL, T3FREE, THYROIDAB in the last 72 hours.  Invalid input(s): FREET3   Other  results:   Imaging    No results found.   Medications:     Scheduled Medications:  amiodarone  200 mg Oral Daily   apixaban  5 mg Oral BID   calcium acetate  667 mg Oral TID WC   Chlorhexidine Gluconate Cloth  6 each Topical Daily   Chlorhexidine Gluconate Cloth  6 each Topical Q0600   Chlorhexidine Gluconate Cloth  6 each Topical Q0600   feeding supplement (NEPRO CARB STEADY)  237 mL Oral BID BM   insulin aspart  0-15 Units Subcutaneous TID WC   insulin aspart  0-5 Units Subcutaneous QHS   mouth rinse  15 mL Mouth Rinse BID   midodrine  15 mg Oral TID WC   multivitamin  1 tablet Oral QHS   polyethylene glycol  17 g Oral Daily   senna  1 tablet Oral Daily   sodium chloride flush  3 mL Intravenous Q12H    Infusions:  sodium chloride     albumin human     promethazine (PHENERGAN) injection (IM or IVPB) Stopped (07/02/21 1831)    PRN Medications: acetaminophen, diphenhydrAMINE, docusate sodium, lactulose, ondansetron (ZOFRAN) IV, oxyCODONE, polyethylene glycol, promethazine (PHENERGAN) injection (IM or IVPB)    Assessment/Plan   1. Acute on chronic diastolic CHF with prominent RV failure: Echo as above with EF 60-65%, moderate LVH, mod-severe RV dysfunction with severe RVE, PASP 69, mod-severe TR, dilated IVC. This is very similar to prior echo from 2018 with RV looking mildly worse.  She has required phenylephrine for BP support, but this is currently off w/ SBPs in 90s-100s, also midodrine 15 tid. RV failure with severe pulmonary hypertension, RA pressure 22 on RHC on 7/14. Still volume overloaded on exam.  - HD today for volume removal, tolerate SBP > 85 as long as she is not symptomatic (need to try to avoid restarting phenylephrine).  2. Pulmonary hypertension: RHC in 8/18 showed severe pulmonary hypertension with relatively low PVR (3.0 WU) and high cardiac output. This was thought to be high output PH primarily in setting of excessive flow through the large left arm  AVF. She was not thought to be a candidate for selective pulmonary vasodilators.  PA systolic pressure by echo is similar this admission (though mildly lower) to the echo from 2018.  Workup for PE was negative this admission. RHC this admission showed pulmonary arterial hypertension, cardiac output not significantly elevated.  Occluding the AV fistula for 5 minutes showed no change in cardiac output or PA pressure.  The study was not consistent with high output HF with pulmonary hypertension from large AV fistula.  It is probable that her  PAH is from OHS/OSA.  - continue to hold sildenafil for now with soft BP, will try to restart eventually.   - Continue midodrine 15 mg tid.  - I do not think that she would benefit from ligation of the fistula based on the RHC.  - Suspect underlying OSA. Reports h/o snoring. Will need outpatient sleep study.  3. Shock: Suspect primarily septic shock from cellulitis initially but RV failure is likely the primary etiology now.  - Continue midodrine 15 mg tid - Long-term concerning prognosis if we are not able to remove fluid via dialysis without pressor as she has significant RV failure. Try to avoid restarting phenylephrine today.  4. ID: Cellulitis.  Completed abx course w/ daptomycin 5. Aflutter w/ RVR: Atypical, s/p DC-CV on 7/15. Remains in NSR. - Amio 200 mg daily.  - Continue Eliquis.  6. Renal: ESRD.   - HD per nephrology.  7. Disposition: plan for SNF at d/c   Will follow at a distance over the weekend, call for questions please.   Loralie Champagne  07/10/2021 7:56 AM

## 2021-07-10 NOTE — Progress Notes (Signed)
PT Cancellation Note  Patient Details Name: [name] MRN: [id number] DOB: 31-Jul-1966   Cancelled Treatment:    Reason Eval/Treat Not Completed: Patient at procedure or test/unavailable (HD)   Oliwia Berzins B Shelsea Hangartner 07/10/2021, 9:21 AM Bayard Males, PT Acute Rehabilitation Services Pager: [phone] Office: [phone]

## 2021-07-10 NOTE — Progress Notes (Addendum)
NAME:  [name], MRN:  [id number], DOB:  September 20, 1966, LOS: 28 ADMISSION DATE:  06/21/2021, CONSULTATION DATE:  06/21/21  REFERRING MD:  Melina Copa, ED, CHIEF COMPLAINT:  lethargy    History of Present Illness:  Ms Kuba is a 55 year old woman with hx of obesity, lymphedema, ESRD on HD MWF, CHF, recently hospitalized at [facility] for cellulitis, discharged 7/3, returning back same day with lethargy, hypotension and elevated lactic acid.  She reported her family was worried about her after d/c because she was sleeping a lot, called to have her brought back to hospital.  No change in pain /tenderness to RLE.   In ED: LR bolus 1.8, doxycycline, cefepime, flagyl given. Admitted for evaluation of hypotension.  She transferred out to the hospitalist service on 7/5.  PCCM called back to see patient for hypotension and tachycardia on 7/12. PE ruled out. Found to have AF/flutter, acute on chronic diastolic CHF with RV failure.  Concern for sepsis resolved but remaining shock thought secondary to RV failure.  Pertinent  Medical History  Anemia Thyroid cancer Diastolic CHF Chiari malformation ESRD on HD M/W/F COPD?  CAD HLD  HTN DM Lymphedema  Significant Hospital Events: Including procedures, antibiotic start and stop dates in addition to other pertinent events   7/3 admitted to ICU on vasopressors, tachyarrhythmias overnight. Switched from NE> neosynephrine. On heparin. 7/4 Echo with acute on subacute R heart failure, LVH with no RWMA. Trop 3500.  7/5 transferred from ICU to hospitalist service 7/12 called back to see pt re: hypotension (normal lactate) and tachycardia 7/14 BP down overnight, in ST.  RHC with fistula occlusion did not support diagnosis of high output failure.  7/18 neo stopped. Getting first round of iHD.  Hypotensive during dialysis, had to be placed back on low-dose phenylephrine but did get 4 L of fluid off 7/20 remains on low dose neo while on iHD. Off neo tonight 7/21 Remains  off neo 7/22 iHD going on and patient not requiring neo  Interim History / Subjective:  iHD  currently; BP stable off neo No complaints; feels good today; some mild discomfort from her fibromyalgia and was given oxy  Objective   Blood pressure (!) 98/55, pulse 70, temperature 98.7 F (37.1 C), resp. rate (!) 21, height _0  (1.651 m), weight (!) 137 kg, SpO2 94 %.       No intake or output data in the 24 hours ending 07/10/21 1059  Filed Weights   07/08/21 1538 07/08/21 1913 07/09/21 0500  Weight: (!) 140 kg (!) 136.5 kg (!) 137 kg    Examination: General:  NAD HEENT: MM pink/moist; [place] in place Neuro: aox3 CV: s1s2, RRR, no m/r/g PULM:  dim clear bs bilaterally; 2 liters Omega GI: soft, bsx4 active  Extremities: warm/dry, BLE wrapped in dressing Skin: no rashes or lesions appreciated   Resolved Hospital Problem list   Sepsis - completed course daptomycin for cellulitis  RLE Cellulitis   Assessment & Plan:   Critically ill due to cardiogenic shock from RV failure due to group 3 Pulmonary hypertension from untreated sleep apnea. Large AV fistula P: -HF following -continue midodrine -Off neo since 7/20 -If patient continues to become hypotensive while on iHD, will need to consider hospice or trial of ligation of AV fistula -sildenafil on hold due to hypotension -continue iHD  Intermittent Atypical aflutter now converted to sinus: S/p DC cardioversion 7/15 P: -continue amio and eliquis -telemetry monitoring  ESRD P: -Nephro following -continue iHD -  trend BMP  Suspected OSA P: -continue CPAP QHS -will need follow up at discharge for CPAP at home and sleep study  Fibromyalgia P: -will start on Cymbalta  Esophageal Dysphagia  P: -Dysphagia precautions -PPI  DM2 P: -SSI and CBG   Will ask [facility] to resume care on 7/23 and transfer to progressive. PCCM will sign off and available prn.   Best Practice (right click and "Reselect all SmartList Selections"  daily)  Diet/type: Regular consistency (see orders) DVT prophylaxis: DOAC GI prophylaxis: PPI Lines: N/A Foley:  N/A   Code Status: Full Code  Critical care time: 35 minutes  JD Rexene Agent [facility] 07/10/2021, 10:59 AM  Please see [email or web address] for pager details.  From 7A-7P if no response, please call [phone]. After hours, please call ELink [phone].

## 2021-07-11 DIAGNOSIS — R7989 Other specified abnormal findings of blood chemistry: Secondary | ICD-10-CM

## 2021-07-11 DIAGNOSIS — I9589 Other hypotension: Secondary | ICD-10-CM | POA: Diagnosis not present

## 2021-07-11 DIAGNOSIS — L03115 Cellulitis of right lower limb: Secondary | ICD-10-CM | POA: Diagnosis not present

## 2021-07-11 DIAGNOSIS — R570 Cardiogenic shock: Secondary | ICD-10-CM | POA: Diagnosis not present

## 2021-07-11 LAB — CBC
HCT: 32.4 % — ABNORMAL LOW (ref 36.0–46.0)
Hemoglobin: 10.1 g/dL — ABNORMAL LOW (ref 12.0–15.0)
MCH: 31 pg (ref 26.0–34.0)
MCHC: 31.2 g/dL (ref 30.0–36.0)
MCV: 99.4 fL (ref 80.0–100.0)
Platelets: 182 10*3/uL (ref 150–400)
RBC: 3.26 MIL/uL — ABNORMAL LOW (ref 3.87–5.11)
RDW: 17.7 % — ABNORMAL HIGH (ref 11.5–15.5)
WBC: 9.4 10*3/uL (ref 4.0–10.5)
nRBC: 0.2 % (ref 0.0–0.2)

## 2021-07-11 LAB — RENAL FUNCTION PANEL
Albumin: 3.1 g/dL — ABNORMAL LOW (ref 3.5–5.0)
Anion gap: 10 (ref 5–15)
BUN: 24 mg/dL — ABNORMAL HIGH (ref 6–20)
CO2: 25 mmol/L (ref 22–32)
Calcium: 9.1 mg/dL (ref 8.9–10.3)
Chloride: 97 mmol/L — ABNORMAL LOW (ref 98–111)
Creatinine, Ser: 4.45 mg/dL — ABNORMAL HIGH (ref 0.44–1.00)
GFR, Estimated: 11 mL/min — ABNORMAL LOW (ref >60)
Glucose, Bld: 131 mg/dL — ABNORMAL HIGH (ref 70–99)
Phosphorus: 3.9 mg/dL (ref 2.5–4.6)
Potassium: 4.3 mmol/L (ref 3.5–5.1)
Sodium: 132 mmol/L — ABNORMAL LOW (ref 135–145)

## 2021-07-11 LAB — GLUCOSE, CAPILLARY
Glucose-Capillary: 125 mg/dL — ABNORMAL HIGH (ref 70–99)
Glucose-Capillary: 117 mg/dL — ABNORMAL HIGH (ref 70–99)
Glucose-Capillary: 139 mg/dL — ABNORMAL HIGH (ref 70–99)
Glucose-Capillary: 132 mg/dL — ABNORMAL HIGH (ref 70–99)

## 2021-07-11 MED ORDER — DARBEPOETIN ALFA 60 MCG/0.3ML IJ SOSY
60.0000 ug | PREFILLED_SYRINGE | INTRAMUSCULAR | Status: DC
  Administered 2021-07-13: 60 ug via INTRAVENOUS
  Filled 2021-07-11 (×2): qty 0.3

## 2021-07-11 NOTE — Progress Notes (Signed)
PROGRESS NOTE    [name]  NFA:[id number] DOB: Nov 25, 1966 DOA: 06/21/2021 PCP: Noreene Larsson, NP    Brief Narrative:  55 year old female with a history of end-stage renal disease on hemodialysis, has had a prolonged hospitalization since she was admitted on 06/21/2021 for cellulitis and concerns for sepsis.  She was treated with antibiotics, but had continued issues with hypotension.  Upon further evaluation, it was felt that her hypotension was more related to cardiogenic shock from RV failure and pulmonary hypertension.  She is being followed by cardiology, nephrology and PCCM.  She is intermittently required vasopressors for hypotension.  She has been off vasopressors since 7/20 and has tolerated hemodialysis.  Currently on midodrine.  Transferred to [facility] service on 7/23   Assessment & Plan:   Principal Problem:   Hypotension Active Problems:   Lymphedema of lower extremity   Morbid obesity (HCC)   Obesity hypoventilation syndrome (HCC)   Cellulitis   ESRD on dialysis ([place])   Elevated troponin   Type 2 diabetes mellitus with hyperglycemia (HCC)   Cardiogenic shock (HCC)   Bilateral calf pain   Pressure injury of skin   Cardiogenic shock due to RV failure Pulmonary hypertension Acute on chronic diastolic congestive heart failure -Patient had been intermittently requiring vasopressors for hypotension during dialysis -Currently on midodrine, will tolerate SBP greater than 85 as long as she is not symptomatic -She has been off vasopressors since 7/20 and has tolerated her last dialysis session -Initially had a trial of sildenafil, but this was held due to hypotension -She is also noted to have a large AV fistula -Concerns that fistula may be contributing to hypotension and low cardiac output -Patient underwent right heart cath, her fistula was temporarily occluded, but this did not result in increased cardiac output or change in PA pressure.  Therefore ligation of fistula was  not recommended at this time  Intermittent atypical atrial flutter, now in sinus rhythm -Status post DCCV on 7/15 -Continue on amiodarone and Eliquis  End-stage renal disease -Nephrology following -Currently, appears to be tolerating IHD -We will need to make sure she is able to sit up in chair for dialysis prior to considering discharge  Suspected OSA -Likely contributing to pulmonary hypertension -She has been using CPAP nightly -We will need outpatient sleep study  Fibromyalgia -Started on Cymbalta  Cellulitis of left lower extremity -Completed course of antibiotics with daptomycin  Anemia of chronic kidney disease -Hemoglobin currently stable -Receives ESA with dialysis  DM2 with hyperglycemia -Blood sugars currently stable -Last A1c from 06/22/2021 was 6.9 -Continue on sliding scale insulin  Generalized weakness -We will need skilled nursing facility placement  Goals of care -Patient has multiple medical problems and with her recurrent hypotension, severe RV failure and difficulty tolerating dialysis, her long-term prognosis appears to be poor at this time -I broached the subject of goals of care, she immediately says that she is not ready for hospice -We will request palliative care consultation to help initiate other conversations including CODE STATUS   DVT prophylaxis: SCDs Start: 06/22/21 0149 apixaban (ELIQUIS) tablet 5 mg  Code Status: Full code Family Communication: Discussed with patient Disposition Plan: Status is: Inpatient  Remains inpatient appropriate because:Ongoing diagnostic testing needed not appropriate for outpatient work up, IV treatments appropriate due to intensity of illness or inability to take PO, and Inpatient level of care appropriate due to severity of illness  Dispo: The patient is from: Home  Anticipated d/c is to: SNF              Patient currently is not medically stable to d/c.   Difficult to place patient  No         Consultants:  PCCM Cardiology Nephrology  Procedures:  7/4 echocardiogram 7/14 right heart cath 7/15 TEE cardioversion  Antimicrobials:      Subjective: Overall she is says she is feeling better today.  Pain from fibromyalgia appears to be doing better today.  She does not feel short of breath.  Objective: Vitals:   07/11/21 1200 07/11/21 1600 07/11/21 1901 07/11/21 1921  BP: 96/60 98/63    Pulse: (!) 107 (!) 105 (!) 102 99  Resp: 19 (!) _0 Temp: 98.9 F (37.2 C) 98.2 F (36.8 C)    TempSrc: Oral Oral    SpO2: 94% 94% 95% 95%  Weight:      Height:        Intake/Output Summary (Last 24 hours) at 07/11/2021 1947 Last data filed at 07/11/2021 1800 Gross per 24 hour  Intake 880 ml  Output 0 ml  Net 880 ml   Filed Weights   07/08/21 1913 07/09/21 0500 07/11/21 0624  Weight: (!) 136.5 kg (!) 137 kg 134 kg    Examination:  General exam: Appears calm and comfortable  Respiratory system: Clear to auscultation. Respiratory effort normal. Cardiovascular system: S1 & S2 heard, RRR. No JVD, murmurs, rubs, gallops or clicks. No pedal edema. Gastrointestinal system: Abdomen is nondistended, soft and nontender. No organomegaly or masses felt. Normal bowel sounds heard. Central nervous system: Alert and oriented. No focal neurological deficits. Extremities: bilateral edema Skin: No rashes, lesions or ulcers Psychiatry: Judgement and insight appear normal. Mood & affect appropriate.     Data Reviewed: I have personally reviewed following labs and imaging studies  CBC: Recent Labs  Lab 07/08/21 0410 07/09/21 0035 07/10/21 0043 07/10/21 1316 07/11/21 0108  WBC 11.6* 10.0 9.9 10.1 9.4  HGB 10.6* 10.5* 9.8* 9.7* 10.1*  HCT 33.4* 33.1* 30.9* 31.0* 32.4*  MCV 99.1 99.1 99.0 98.7 99.4  PLT 220 203 194 178 497   Basic Metabolic Panel: Recent Labs  Lab 07/08/21 0410 07/09/21 0035 07/09/21 0616 07/10/21 0043 07/10/21 1316 07/11/21 0108   NA 134* 135  --  135 133* 132*  K 4.6 4.1  --  4.5 4.0 4.3  CL 97* 98  --  95* 97* 97*  CO2 27 26  --  _1 GLUCOSE 134* 143*  --  149* 144* 131*  BUN 34* 24*  --  33* 17 24*  CREATININE 5.96* 4.64*  --  5.79* 3.72* 4.45*  CALCIUM 8.7* 8.7*  --  8.7* 8.8* 9.1  MG  --   --  2.1  --   --   --   PHOS 4.6 3.6  --  4.1 3.1 3.9   GFR: Estimated Creatinine Clearance: 20 mL/min (A) (by C-G formula based on SCr of 4.45 mg/dL (H)). Liver Function Tests: Recent Labs  Lab 07/08/21 0410 07/09/21 0035 07/10/21 0043 07/10/21 1316 07/11/21 0108  ALBUMIN 2.8* 3.1* 2.9* 3.2* 3.1*   No results for input(s): LIPASE, AMYLASE in the last 168 hours. No results for input(s): AMMONIA in the last 168 hours. Coagulation Profile: No results for input(s): INR, PROTIME in the last 168 hours. Cardiac Enzymes: No results for input(s): CKTOTAL, CKMB, CKMBINDEX, TROPONINI in the last 168 hours. BNP (last 3 results) No results for  input(s): PROBNP in the last 8760 hours. HbA1C: No results for input(s): HGBA1C in the last 72 hours. CBG: Recent Labs  Lab 07/10/21 1529 07/10/21 2106 07/11/21 0622 07/11/21 1146 07/11/21 1531  GLUCAP 160* 126* 125* 117* 139*   Lipid Profile: No results for input(s): CHOL, HDL, LDLCALC, TRIG, CHOLHDL, LDLDIRECT in the last 72 hours. Thyroid Function Tests: No results for input(s): TSH, T4TOTAL, FREET4, T3FREE, THYROIDAB in the last 72 hours. Anemia Panel: No results for input(s): VITAMINB12, FOLATE, FERRITIN, TIBC, IRON, RETICCTPCT in the last 72 hours. Sepsis Labs: No results for input(s): PROCALCITON, LATICACIDVEN in the last 168 hours.  No results found for this or any previous visit (from the past 240 hour(s)).       Radiology Studies: No results found.      Scheduled Meds:  amiodarone  200 mg Oral Daily   apixaban  5 mg Oral BID   calcium acetate  667 mg Oral TID WC   Chlorhexidine Gluconate Cloth  6 each Topical Daily   Chlorhexidine  Gluconate Cloth  6 each Topical Q0600   [START ON 07/13/2021] darbepoetin (ARANESP) injection - DIALYSIS  60 mcg Intravenous Q Mon-HD   DULoxetine  20 mg Oral Daily   feeding supplement (NEPRO CARB STEADY)  237 mL Oral BID BM   insulin aspart  0-15 Units Subcutaneous TID WC   insulin aspart  0-5 Units Subcutaneous QHS   mouth rinse  15 mL Mouth Rinse BID   midodrine  15 mg Oral TID WC   multivitamin  1 tablet Oral QHS   polyethylene glycol  17 g Oral Daily   senna  1 tablet Oral Daily   sodium chloride flush  3 mL Intravenous Q12H   Continuous Infusions:  sodium chloride     sodium chloride     phenylephrine ([name]-SYNEPHRINE) Adult infusion     promethazine (PHENERGAN) injection (IM or IVPB) Stopped (07/02/21 1831)     LOS: 20 days    Time spent: 39mns    JKathie Dike MD [facility]   If 7PM-7AM, please contact night-coverage [email or web address]  07/11/2021, 7:47 PM

## 2021-07-11 NOTE — Plan of Care (Signed)

## 2021-07-11 NOTE — Progress Notes (Signed)
[facility] Progress Note    Assessment/ Plan:   # ESRD: Outpatient orders: [facility], Monday Wednesday Friday, 255 minutes.  350 BFR/500 DFR  1K, 2.5 calcium, 138 sodium, EDW 135.5 kg, 15-gauge needles, machine temp 37 -Has now been tolerating UF with each treatment- removed 2.5 liters with HD yest - will plan for routine HD next Monday via AVF   Acute on chronic RV failure with shock, pulmonary HTN -CTA r/o'd PE -TTE with normal EF, mod LVH and severe RV dysfunction -RHC with elevated RA pressure (22), no marked change in pressures or CO with AVF occlusion -Her AVF does not appear to be very high flow.  Will  not ligate it.  -on midodrine 15 TID -sildenafil stopped in light of ongoing hypotension UF as able with HD-  using low temp- albumin and profile to get volume off   Volume/ hypertension: EDW 135.5kg. UF with HD.  Over EDW still but making progress, achieved 2.5 liters with last HD-  will continue to push-  today actually recorded at 134    Anemia of Chronic Kidney Disease: Hb in 10-11s without ESA here to date. No dipped under 10-  will add ESA   Secondary Hyperparathyroidism/Hyperphosphatemia: sensipar 60 qtx -  phos OK on phoslo -    DM2 with hyperglycemia: mgmt per primary service     [facility]    Subjective:   Ended up getting 2.5 liters off with HD yesterday-  moved to [place]- no c/o this AM-  looks good    Objective:   BP 102/89 (BP Location: Right Wrist)   Pulse 72   Temp 98.8 F (37.1 C) (Oral)   Resp 20   Ht _0  (1.651 m)   Wt 134 kg   SpO2 94%   BMI 49.16 kg/m   Intake/Output Summary (Last 24 hours) at 07/11/2021 0756 Last data filed at 07/11/2021 0700 Gross per 24 hour  Intake 200 ml  Output 2500 ml  Net -2300 ml    Weight change:   Physical Exam: Gen:nad, sitting up in bed CVS:s1s2, rrr Resp: normal wob BJS:EGBTD, soft, nt VVO:HYWVPXTGGY bilateral LE's w/ chronic skin  changes Neuro: awake, alert, moves all ext spontaneously, speech clear and coherent HD access: lue avf +b/t   Imaging: No results found.  Labs: BMET Recent Labs  Lab 07/06/21 0044 07/07/21 0442 07/08/21 0410 07/09/21 0035 07/10/21 0043 07/10/21 1316 07/11/21 0108  NA 135 137 134* 135 135 133* 132*  K 4.7 3.8 4.6 4.1 4.5 4.0 4.3  CL 94* 95* 97* 98 95* 97* 97*  CO2 _1 GLUCOSE 121* 126* 134* 143* 149* 144* 131*  BUN 47* 26* 34* 24* 33* 17 24*  CREATININE 6.83* 4.97* 5.96* 4.64* 5.79* 3.72* 4.45*  CALCIUM 8.8* 8.7* 8.7* 8.7* 8.7* 8.8* 9.1  PHOS  --  4.4 4.6 3.6 4.1 3.1 3.9   CBC Recent Labs  Lab 07/09/21 0035 07/10/21 0043 07/10/21 1316 07/11/21 0108  WBC 10.0 9.9 10.1 9.4  HGB 10.5* 9.8* 9.7* 10.1*  HCT 33.1* 30.9* 31.0* 32.4*  MCV 99.1 99.0 98.7 99.4  PLT 203 194 178 182    Medications:     amiodarone  200 mg Oral Daily   apixaban  5 mg Oral BID   calcium acetate  667 mg Oral TID WC   Chlorhexidine Gluconate Cloth  6 each Topical Daily   Chlorhexidine Gluconate Cloth  6 each Topical  [id number]   Chlorhexidine Gluconate Cloth  6 each Topical Q0600   DULoxetine  20 mg Oral Daily   feeding supplement (NEPRO CARB STEADY)  237 mL Oral BID BM   insulin aspart  0-15 Units Subcutaneous TID WC   insulin aspart  0-5 Units Subcutaneous QHS   mouth rinse  15 mL Mouth Rinse BID   midodrine  15 mg Oral TID WC   multivitamin  1 tablet Oral QHS   polyethylene glycol  17 g Oral Daily   senna  1 tablet Oral Daily   sodium chloride flush  3 mL Intravenous Q12H    [facility]

## 2021-07-12 DIAGNOSIS — Z7189 Other specified counseling: Secondary | ICD-10-CM | POA: Diagnosis not present

## 2021-07-12 DIAGNOSIS — R7989 Other specified abnormal findings of blood chemistry: Secondary | ICD-10-CM | POA: Diagnosis not present

## 2021-07-12 DIAGNOSIS — Z992 Dependence on renal dialysis: Secondary | ICD-10-CM | POA: Diagnosis not present

## 2021-07-12 DIAGNOSIS — R570 Cardiogenic shock: Secondary | ICD-10-CM | POA: Diagnosis not present

## 2021-07-12 DIAGNOSIS — Z515 Encounter for palliative care: Secondary | ICD-10-CM | POA: Diagnosis not present

## 2021-07-12 DIAGNOSIS — I9589 Other hypotension: Secondary | ICD-10-CM | POA: Diagnosis not present

## 2021-07-12 DIAGNOSIS — L03115 Cellulitis of right lower limb: Secondary | ICD-10-CM | POA: Diagnosis not present

## 2021-07-12 DIAGNOSIS — N186 End stage renal disease: Secondary | ICD-10-CM | POA: Diagnosis not present

## 2021-07-12 LAB — RENAL FUNCTION PANEL
Albumin: 3 g/dL — ABNORMAL LOW (ref 3.5–5.0)
Anion gap: 12 (ref 5–15)
BUN: 31 mg/dL — ABNORMAL HIGH (ref 6–20)
CO2: 27 mmol/L (ref 22–32)
Calcium: 9.1 mg/dL (ref 8.9–10.3)
Chloride: 94 mmol/L — ABNORMAL LOW (ref 98–111)
Creatinine, Ser: 5.69 mg/dL — ABNORMAL HIGH (ref 0.44–1.00)
GFR, Estimated: 8 mL/min — ABNORMAL LOW (ref >60)
Glucose, Bld: 104 mg/dL — ABNORMAL HIGH (ref 70–99)
Phosphorus: 4.7 mg/dL — ABNORMAL HIGH (ref 2.5–4.6)
Potassium: 5.1 mmol/L (ref 3.5–5.1)
Sodium: 133 mmol/L — ABNORMAL LOW (ref 135–145)

## 2021-07-12 LAB — CBC
HCT: 33 % — ABNORMAL LOW (ref 36.0–46.0)
Hemoglobin: 10.4 g/dL — ABNORMAL LOW (ref 12.0–15.0)
MCH: 31.3 pg (ref 26.0–34.0)
MCHC: 31.5 g/dL (ref 30.0–36.0)
MCV: 99.4 fL (ref 80.0–100.0)
Platelets: 189 10*3/uL (ref 150–400)
RBC: 3.32 MIL/uL — ABNORMAL LOW (ref 3.87–5.11)
RDW: 17.4 % — ABNORMAL HIGH (ref 11.5–15.5)
WBC: 9.9 10*3/uL (ref 4.0–10.5)
nRBC: 0.2 % (ref 0.0–0.2)

## 2021-07-12 LAB — GLUCOSE, CAPILLARY
Glucose-Capillary: 107 mg/dL — ABNORMAL HIGH (ref 70–99)
Glucose-Capillary: 113 mg/dL — ABNORMAL HIGH (ref 70–99)
Glucose-Capillary: 120 mg/dL — ABNORMAL HIGH (ref 70–99)
Glucose-Capillary: 178 mg/dL — ABNORMAL HIGH (ref 70–99)

## 2021-07-12 MED ORDER — CHLORHEXIDINE GLUCONATE CLOTH 2 % EX PADS
6.0000 | MEDICATED_PAD | Freq: Every day | CUTANEOUS | Status: DC
  Administered 2021-07-13 – 2021-07-31 (×17): 6 via TOPICAL

## 2021-07-12 NOTE — Plan of Care (Signed)

## 2021-07-12 NOTE — Progress Notes (Signed)
PROGRESS NOTE    [name]  ZHG:[id number] DOB: Aug 17, 1966 DOA: 06/21/2021 PCP: Noreene Larsson, NP    Brief Narrative:  55 year old female with a history of end-stage renal disease on hemodialysis, has had a prolonged hospitalization since she was admitted on 06/21/2021 for cellulitis and concerns for sepsis.  She was treated with antibiotics, but had continued issues with hypotension.  Upon further evaluation, it was felt that her hypotension was more related to cardiogenic shock from RV failure and pulmonary hypertension.  She is being followed by cardiology, nephrology and PCCM.  She is intermittently required vasopressors for hypotension.  She has been off vasopressors since 7/20 and has tolerated hemodialysis.  Currently on midodrine.  Transferred to [facility] service on 7/23   Assessment & Plan:   Principal Problem:   Hypotension Active Problems:   Lymphedema of lower extremity   Morbid obesity (HCC)   Obesity hypoventilation syndrome (HCC)   Cellulitis   ESRD on dialysis ([place])   Elevated troponin   Type 2 diabetes mellitus with hyperglycemia (HCC)   Cardiogenic shock (HCC)   Bilateral calf pain   Pressure injury of skin   Cardiogenic shock due to RV failure Pulmonary hypertension Acute on chronic diastolic congestive heart failure -Patient had been intermittently requiring vasopressors for hypotension during dialysis -Currently on midodrine, will tolerate SBP greater than 85 as long as she is not symptomatic -She has been off vasopressors since 7/20 and has tolerated her last dialysis session -Initially had a trial of sildenafil, but this was held due to hypotension -She is also noted to have a large AV fistula -Concerns that fistula may be contributing to hypotension and low cardiac output -Patient underwent right heart cath, her fistula was temporarily occluded, but this did not result in increased cardiac output or change in PA pressure.  Therefore ligation of fistula was  not recommended at this time -Further volume removal with next dialysis session  Intermittent atypical atrial flutter, now in sinus rhythm -Status post DCCV on 7/15 -Continue on amiodarone and Eliquis  End-stage renal disease -Nephrology following -Currently, appears to be tolerating IHD -We will need to make sure she is able to sit up in chair for dialysis prior to considering discharge  Suspected OSA -Likely contributing to pulmonary hypertension -She has been using CPAP nightly -We will need outpatient sleep study  Fibromyalgia -Started on Cymbalta  Cellulitis of left lower extremity -Completed course of antibiotics with daptomycin  Anemia of chronic kidney disease -Hemoglobin currently stable -Receives ESA with dialysis  DM2 with hyperglycemia -Blood sugars currently stable -Last A1c from 06/22/2021 was 6.9 -Continue on sliding scale insulin  Generalized weakness -Will need skilled nursing facility placement  Goals of care -Patient has multiple medical problems and with her recurrent hypotension, severe RV failure and difficulty tolerating dialysis, her long-term prognosis appears to be poor at this time -I broached the subject of goals of care, she immediately says that she is not ready for hospice -We will request palliative care consultation to help initiate other conversations including CODE STATUS   DVT prophylaxis: SCDs Start: 06/22/21 0149 apixaban (ELIQUIS) tablet 5 mg  Code Status: Full code Family Communication: Discussed with patient Disposition Plan: Status is: Inpatient  Remains inpatient appropriate because:Ongoing diagnostic testing needed not appropriate for outpatient work up, IV treatments appropriate due to intensity of illness or inability to take PO, and Inpatient level of care appropriate due to severity of illness  Dispo: The patient is from: Home  Anticipated d/c is to: SNF              Patient currently is not medically stable  to d/c.   Difficult to place patient No         Consultants:  PCCM Cardiology Nephrology  Procedures:  7/4 echocardiogram 7/14 right heart cath 7/15 TEE cardioversion  Antimicrobials:      Subjective: She is currently on room air and does not feel short of breath.  She continues to have some pain in her left leg which she feels is related to the edema and associated blistering  Objective: Vitals:   07/12/21 1615 07/12/21 1630 07/12/21 1645 07/12/21 1700  BP:      Pulse: 70 72  77  Resp:   (!) 27 17  Temp:      TempSrc:      SpO2: 95% 99%  (!) 86%  Weight:      Height:        Intake/Output Summary (Last 24 hours) at 07/12/2021 1749 Last data filed at 07/12/2021 1200 Gross per 24 hour  Intake 1637 ml  Output 0 ml  Net 1637 ml   Filed Weights   07/09/21 0500 07/11/21 0624 07/12/21 0400  Weight: (!) 137 kg 134 kg 134 kg    Examination:  General exam: Alert, awake, oriented x 3 Respiratory system: Clear to auscultation. Respiratory effort normal. Cardiovascular system:RRR. No murmurs, rubs, gallops. Gastrointestinal system: Abdomen is nondistended, soft and nontender. No organomegaly or masses felt. Normal bowel sounds heard. Central nervous system: Alert and oriented. No focal neurological deficits. Extremities: Bilateral lower extremity lymphedema, lower legs are wrapped Skin: No rashes, lesions or ulcers Psychiatry: Judgement and insight appear normal. Mood & affect appropriate.      Data Reviewed: I have personally reviewed following labs and imaging studies  CBC: Recent Labs  Lab 07/09/21 0035 07/10/21 0043 07/10/21 1316 07/11/21 0108 07/12/21 0051  WBC 10.0 9.9 10.1 9.4 9.9  HGB 10.5* 9.8* 9.7* 10.1* 10.4*  HCT 33.1* 30.9* 31.0* 32.4* 33.0*  MCV 99.1 99.0 98.7 99.4 99.4  PLT 203 194 178 182 497   Basic Metabolic Panel: Recent Labs  Lab 07/09/21 0035 07/09/21 0616 07/10/21 0043 07/10/21 1316 07/11/21 0108 07/12/21 0051  NA 135   --  135 133* 132* 133*  K 4.1  --  4.5 4.0 4.3 5.1  CL 98  --  95* 97* 97* 94*  CO2 26  --  _0 GLUCOSE 143*  --  149* 144* 131* 104*  BUN 24*  --  33* 17 24* 31*  CREATININE 4.64*  --  5.79* 3.72* 4.45* 5.69*  CALCIUM 8.7*  --  8.7* 8.8* 9.1 9.1  MG  --  2.1  --   --   --   --   PHOS 3.6  --  4.1 3.1 3.9 4.7*   GFR: Estimated Creatinine Clearance: 15.7 mL/min (A) (by C-G formula based on SCr of 5.69 mg/dL (H)). Liver Function Tests: Recent Labs  Lab 07/09/21 0035 07/10/21 0043 07/10/21 1316 07/11/21 0108 07/12/21 0051  ALBUMIN 3.1* 2.9* 3.2* 3.1* 3.0*   No results for input(s): LIPASE, AMYLASE in the last 168 hours. No results for input(s): AMMONIA in the last 168 hours. Coagulation Profile: No results for input(s): INR, PROTIME in the last 168 hours. Cardiac Enzymes: No results for input(s): CKTOTAL, CKMB, CKMBINDEX, TROPONINI in the last 168 hours. BNP (last 3 results) No results for input(s): PROBNP in the last  8760 hours. HbA1C: No results for input(s): HGBA1C in the last 72 hours. CBG: Recent Labs  Lab 07/11/21 1531 07/11/21 2140 07/12/21 0621 07/12/21 1127 07/12/21 1542  GLUCAP 139* 132* 107* 113* 120*   Lipid Profile: No results for input(s): CHOL, HDL, LDLCALC, TRIG, CHOLHDL, LDLDIRECT in the last 72 hours. Thyroid Function Tests: No results for input(s): TSH, T4TOTAL, FREET4, T3FREE, THYROIDAB in the last 72 hours. Anemia Panel: No results for input(s): VITAMINB12, FOLATE, FERRITIN, TIBC, IRON, RETICCTPCT in the last 72 hours. Sepsis Labs: No results for input(s): PROCALCITON, LATICACIDVEN in the last 168 hours.  No results found for this or any previous visit (from the past 240 hour(s)).       Radiology Studies: No results found.      Scheduled Meds:  amiodarone  200 mg Oral Daily   apixaban  5 mg Oral BID   calcium acetate  667 mg Oral TID WC   Chlorhexidine Gluconate Cloth  6 each Topical Daily   Chlorhexidine Gluconate Cloth   6 each Topical Q0600   [START ON 07/13/2021] darbepoetin (ARANESP) injection - DIALYSIS  60 mcg Intravenous Q Mon-HD   DULoxetine  20 mg Oral Daily   feeding supplement (NEPRO CARB STEADY)  237 mL Oral BID BM   insulin aspart  0-15 Units Subcutaneous TID WC   insulin aspart  0-5 Units Subcutaneous QHS   mouth rinse  15 mL Mouth Rinse BID   midodrine  15 mg Oral TID WC   multivitamin  1 tablet Oral QHS   polyethylene glycol  17 g Oral Daily   senna  1 tablet Oral Daily   sodium chloride flush  3 mL Intravenous Q12H   Continuous Infusions:  sodium chloride     sodium chloride     promethazine (PHENERGAN) injection (IM or IVPB) Stopped (07/02/21 1831)     LOS: 21 days    Time spent: 62mns    JKathie Dike MD [facility]   If 7PM-7AM, please contact night-coverage [email or web address]  07/12/2021, 5:49 PM

## 2021-07-12 NOTE — Consult Note (Signed)
Consultation Note Date: 07/12/2021   Patient Name: [name]  DOB: Jan 19, 1966  MRN: [id number]  Age / Sex: 55 y.o., female  PCP: Lindsay Larsson, NP Referring Physician: Kathie Dike, MD  Reason for Consultation: Establishing goals of care  HPI/Patient Profile: 55 y.o. female  with past medical history of ESRD on HD MWF, hypertension, diabetes, CHF, CAD, COPD, chronic lymphedema left leg, thyroid cancer, Chiari malformation, fibromyalagia admitted on 06/21/2021 with increased right hip and leg pain with recent cellulitis admission but return to ED with lethargy, hypotension, elevated lactic acid septic shock requiring ICU and vasopressors and found to be in cardiogenic shock from RV failure and group 3 pulmonary hypertension from untreated sleep apnea. Ongoing issues with hypotension limiting dialysis although off vasopressors since 7/20. Concerns noted that fistula may be contributing to hypotension and low cardiac output but not thought likely after right heart cath.   Clinical Assessment and Goals of Care: I met today with [name] and no family present at bedside at time of my visit. She is awake and alert today. She is in good spirits. [name] has good understanding of her medical conditions and what has led her to this degree of debility. She maintains that she is feeling well, tolerating dialysis well, and fibromyalgia pain is much improved with some pain medication and heating pad. [name] is very motivated to do all that she can to continue to improve. She knows that this will take time. She notes that she has very good support with her husband, son, daughter, grandchildren (she is raising her 25 yo granddaughter), and she is the youngest of 26 siblings.   We further discussed her wishes and although she does endorse desire for full scope treatment and would want resuscitation efforts she  tells me that she has "been at death's door many times." It is not lost on her the importance of having these conversations and she expresses interest completing Advance Directive so I provided her a copy. We discussed the importance of completion of documents but even more important to have these conversations with her family so they are aware of her thoughts and wishes. [name] shares that she has unfortunately experienced the loss of 4 family members over the last month (including her father) so this makes having these conversations even more important to her. [name] also expresses that she has a great faith and finds comfort in her faith in God.   All questions/concerns addressed. Emotional support provided.   Primary Decision Maker PATIENT    SUMMARY OF RECOMMENDATIONS   - Desire for full code, full scope - Hopeful for improvement - Open to rehab stay - Living Will provided for review  Code Status/Advance Care Planning: Full code   Symptom Management:  Per attending.   Palliative Prophylaxis:  Bowel Regimen, Delirium Protocol, Frequent Pain Assessment, and Turn Reposition  Additional Recommendations (Limitations, Scope, Preferences): Full Scope Treatment  Psycho-social/Spiritual:  Desire for further Chaplaincy support:yes Additional Recommendations: Caregiving  Support/Resources  Prognosis:  Unable to determine  Discharge Planning: [place] for rehab with Palliative care service follow-up      Primary Diagnoses: Present on Admission:  (Resolved) Septic shock ([place])  Cellulitis  Hypotension  Bilateral calf pain  Morbid obesity (HCC)  Obesity hypoventilation syndrome (HCC)  Elevated troponin  Type 2 diabetes mellitus with hyperglycemia ([place])   I have reviewed the medical record, interviewed the patient and family, and examined the patient. The following aspects are pertinent.  Past Medical History:  Diagnosis Date   Allergy    Phreesia  11/04/2020   Anemia, unspecified    Anxiety    Arthritis    Phreesia 11/04/2020   Asthma    as a child   Cancer ([place])    thyroid   Cellulitis    CHF (congestive heart failure) ([place])    Chiari malformation    s/p surgery   Chiari malformation    Chronic kidney disease    Phreesia 11/04/2020   Chronic kidney disease, stage 5 (HCC)    Chronic obstructive pulmonary disease, unspecified ([place])    Chronic pain    Complication of anesthesia 11/28/15   Resp arrest after  conscious  sedation   Compression of brain (HCC)    Constipation    COPD (chronic obstructive pulmonary disease) ([place])    Phreesia 11/04/2020   Coronary artery disease    40-50% mid LAD 04/29/09, Medical tx. (Dr. Gwenlyn Found)   Diabetes mellitus    Type II- reports being off all medication d/t it being controlled   Diabetes mellitus without complication ([place])    Phreesia 11/04/2020   Essential (primary) hypertension    Fibromyalgia    Fibromyalgia    History of blood transfusion    hemorrage duinrg pregancy   Hx of echocardiogram 10/2011   EF 55-60%   Hypercholesterolemia    Hyperlipidemia, unspecified    Hypertension    Hypertensive chronic kidney disease with stage 1 through stage 4 chronic kidney disease, or unspecified chronic kidney disease    Hypertensive chronic kidney disease with stage 5 chronic kidney disease or end stage renal disease (HCC)    Lymph edema    Lymphedema, not elsewhere classified    Morbid (severe) obesity due to excess calories (HCC)    Obesity hypoventilation syndrome ([place])    On home oxygen therapy    "2L; 24/7" (12/21/2018)   Peritonitis, unspecified ([place])    Pneumonia    in past   Pulmonary hypertension ([place])    Renal disorder    M/W/F [facility] Pt started dialysis in Dec.2016   S/P colonoscopy 05/26/2007   Dr. Laural Golden sigmoid diverticulosis random biopsies benign   S/P endoscopy 05/01/2009   Dr. Penelope Coop pill-induced esophageal ulcerations distal to midesophagus, 2 small  ulcers in the antrum of the stomach   Shortness of breath dyspnea    with any exertion or if heart rate  is irregular while on dialysis   Sleep apnea    reports that she no longer needs CPAP due to weight loss   Sleep apnea, unspecified    Type 2 diabetes mellitus with diabetic nephropathy ([place])    Type 2 diabetes mellitus with hyperglycemia ([place])    Social History   Socioeconomic History   Marital status: Married    Spouse name: Not on file   Number of children: 3   Years of education: Not on file   Highest education level: Not on file  Occupational History   Not on file  Tobacco  Use   Smoking status: Never   Smokeless tobacco: Never  Vaping Use   Vaping Use: Never used  Substance and Sexual Activity   Alcohol use: No    Alcohol/week: 0.0 standard drinks   Drug use: No   Sexual activity: Not Currently  Other Topics Concern   Not on file  Social History Narrative   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   Social Determinants of Health   Financial Resource Strain: Low Risk    Difficulty of Paying Living Expenses: Not very hard  Food Insecurity: No Food Insecurity   Worried About Charity fundraiser in the Last Year: Never true   Ran Out of Food in the Last Year: Never true  Transportation Needs: No Transportation Needs   Lack of Transportation (Medical): No   Lack of Transportation (Non-Medical): No  Physical Activity: Not on file  Stress: Not on file  Social Connections: Not on file   Family History  Problem Relation Age of Onset   Colon cancer Mother 19   Stroke Mother 100   Coronary artery disease Mother    Scheduled Meds:  amiodarone  200 mg Oral Daily   apixaban  5 mg Oral BID   calcium acetate  667 mg Oral TID WC   Chlorhexidine Gluconate Cloth  6 each Topical Daily   Chlorhexidine Gluconate Cloth  6 each Topical Q0600   Chlorhexidine Gluconate Cloth  6 each Topical Q0600   [START ON 07/13/2021] darbepoetin (ARANESP) injection - DIALYSIS  60 mcg Intravenous Q  Mon-HD   DULoxetine  20 mg Oral Daily   feeding supplement (NEPRO CARB STEADY)  237 mL Oral BID BM   insulin aspart  0-15 Units Subcutaneous TID WC   insulin aspart  0-5 Units Subcutaneous QHS   mouth rinse  15 mL Mouth Rinse BID   midodrine  15 mg Oral TID WC   multivitamin  1 tablet Oral QHS   polyethylene glycol  17 g Oral Daily   senna  1 tablet Oral Daily   sodium chloride flush  3 mL Intravenous Q12H   Continuous Infusions:  sodium chloride     sodium chloride     phenylephrine ([name]-SYNEPHRINE) Adult infusion     promethazine (PHENERGAN) injection (IM or IVPB) Stopped (07/02/21 1831)   PRN Meds:.acetaminophen, diphenhydrAMINE, docusate sodium, fentaNYL (SUBLIMAZE) injection, heparin, lactulose, ondansetron (ZOFRAN) IV, oxyCODONE, polyethylene glycol, promethazine (PHENERGAN) injection (IM or IVPB) Allergies  Allergen Reactions   Contrast Media [Iodinated Diagnostic Agents] Anaphylaxis, [name], Swelling and Other (See Comments)    Dye for cardiac cath. Tongue swells   Pneumococcal Vaccines Swelling and Other (See Comments)    Turns skin black, and bodily swelling   Vancomycin Nausea And Vomiting and Other (See Comments)    Infusion "made me feel like I was dying" had to be readmitted to hospital   Review of Systems  Constitutional:  Positive for activity change, appetite change and fatigue.  Neurological:  Positive for weakness.   Physical Exam Vitals and nursing note reviewed.  Constitutional:      General: She is not in acute distress.    Appearance: She is ill-appearing.  Cardiovascular:     Rate and Rhythm: Normal rate.  Pulmonary:     Effort: No tachypnea, accessory muscle usage or respiratory distress.  Abdominal:     General: Abdomen is flat.     Palpations: Abdomen is soft.  Neurological:     Mental Status: She is alert and oriented to person, place,  and time.    Vital Signs: BP 108/64 (BP Location: Right Wrist)   Pulse 65   Temp 98.5 F (36.9 C) (Oral)    Resp 17   Ht _0  (1.651 m)   Wt 134 kg   SpO2 96%   BMI 49.16 kg/m  Pain Scale: 0-10 POSS *See Group Information*: S-Acceptable,Sleep, easy to arouse Pain Score: 5    SpO2: SpO2: 96 % O2 Device:SpO2: 96 % O2 Flow Rate: .O2 Flow Rate (L/min): 2 L/min  IO: Intake/output summary:  Intake/Output Summary (Last 24 hours) at 07/12/2021 6244 Last data filed at 07/12/2021 0800 Gross per 24 hour  Intake 1140 ml  Output 0 ml  Net 1140 ml    LBM: Last BM Date: 07/09/21 Baseline Weight: Weight: 135 kg Most recent weight: Weight: 134 kg     Palliative Assessment/Data:     Time In: 0950 Time Out: 1040 Time Total: 50 min Greater than 50%  of this time was spent counseling and coordinating care related to the above assessment and plan.  Signed by: Vinie Sill, NP Palliative Medicine Team Pager # [phone] (M-F 8a-5p) Team Phone # [phone] (Nights/Weekends)

## 2021-07-12 NOTE — Progress Notes (Signed)
Pt placed on cpap 

## 2021-07-12 NOTE — Progress Notes (Signed)
[facility] Progress Note    Assessment/ Plan:   # ESRD: Outpatient orders: [facility], Monday Wednesday Friday, 255 minutes.  350 BFR/500 DFR  1K, 2.5 calcium, 138 sodium, EDW 135.5 kg, 15-gauge needles, machine temp 37 -Has now been tolerating UF with each treatment- removed 2.5 liters with HD yest - will plan for routine HD next Monday via AVF-  eventually work toward doing HD up in chair   Acute on chronic RV failure with shock, pulmonary HTN -CTA r/o'd PE -TTE with normal EF, mod LVH and severe RV dysfunction -RHC with elevated RA pressure (22), no marked change in pressures or CO with AVF occlusion -Her AVF does not appear to be very high flow.  Will  not ligate it.  -on midodrine 15 TID -sildenafil stopped in light of ongoing hypotension UF as able with HD-  using low temp- albumin and profile to get volume off-  will stop using albumin as they cannnot give as OP but on max midodrine    Volume/ hypertension: EDW 135.5kg. UF with HD.  Over EDW still but making progress, achieved 2.5 liters with last HD-  will continue to push-  today weight actually recorded at 134    Anemia of Chronic Kidney Disease: Hb in 10-11s without ESA here to date. Now dipped under 10-  have added ESA   Secondary Hyperparathyroidism/Hyperphosphatemia: sensipar 60 qtx -  phos OK on phoslo -    DM2 with hyperglycemia: mgmt per primary service     [name]    Subjective:   No significant events overnight - very pleasant-  willing to do what it takes to get home    Objective:   BP 111/71 (BP Location: Right Wrist)   Pulse 65   Temp 98.5 F (36.9 C) (Oral)   Resp 16   Ht _0  (1.651 m)   Wt 134 kg   SpO2 95%   BMI 49.16 kg/m   Intake/Output Summary (Last 24 hours) at 07/12/2021 0738 Last data filed at 07/12/2021 0600 Gross per 24 hour  Intake 880 ml  Output 0 ml  Net 880 ml    Weight change: 0 kg  Physical Exam: Gen:nad,  sitting up in bed CVS:s1s2, rrr Resp: normal wob QQP:YPPJK, soft, nt DTO:[id number] bilateral LE's w/ chronic skin changes Neuro: awake, alert, moves all ext spontaneously, speech clear and coherent HD access: lue avf +b/t   Imaging: No results found.  Labs: BMET Recent Labs  Lab 07/07/21 0442 07/08/21 0410 07/09/21 0035 07/10/21 0043 07/10/21 1316 07/11/21 0108 07/12/21 0051  NA 137 134* 135 135 133* 132* 133*  K 3.8 4.6 4.1 4.5 4.0 4.3 5.1  CL 95* 97* 98 95* 97* 97* 94*  CO2 _1 GLUCOSE 126* 134* 143* 149* 144* 131* 104*  BUN 26* 34* 24* 33* 17 24* 31*  CREATININE 4.97* 5.96* 4.64* 5.79* 3.72* 4.45* 5.69*  CALCIUM 8.7* 8.7* 8.7* 8.7* 8.8* 9.1 9.1  PHOS 4.4 4.6 3.6 4.1 3.1 3.9 4.7*   CBC Recent Labs  Lab 07/10/21 0043 07/10/21 1316 07/11/21 0108 07/12/21 0051  WBC 9.9 10.1 9.4 9.9  HGB 9.8* 9.7* 10.1* 10.4*  HCT 30.9* 31.0* 32.4* 33.0*  MCV 99.0 98.7 99.4 99.4  PLT 194 178 182 189    Medications:     amiodarone  200 mg Oral Daily   apixaban  5 mg Oral BID   calcium acetate  667 mg  Oral TID WC   Chlorhexidine Gluconate Cloth  6 each Topical Daily   Chlorhexidine Gluconate Cloth  6 each Topical Q0600   [START ON 07/13/2021] darbepoetin (ARANESP) injection - DIALYSIS  60 mcg Intravenous Q Mon-HD   DULoxetine  20 mg Oral Daily   feeding supplement (NEPRO CARB STEADY)  237 mL Oral BID BM   insulin aspart  0-15 Units Subcutaneous TID WC   insulin aspart  0-5 Units Subcutaneous QHS   mouth rinse  15 mL Mouth Rinse BID   midodrine  15 mg Oral TID WC   multivitamin  1 tablet Oral QHS   polyethylene glycol  17 g Oral Daily   senna  1 tablet Oral Daily   sodium chloride flush  3 mL Intravenous Q12H    [name]

## 2021-07-13 DIAGNOSIS — L03115 Cellulitis of right lower limb: Secondary | ICD-10-CM | POA: Diagnosis not present

## 2021-07-13 DIAGNOSIS — R570 Cardiogenic shock: Secondary | ICD-10-CM | POA: Diagnosis not present

## 2021-07-13 DIAGNOSIS — I9589 Other hypotension: Secondary | ICD-10-CM | POA: Diagnosis not present

## 2021-07-13 DIAGNOSIS — R7989 Other specified abnormal findings of blood chemistry: Secondary | ICD-10-CM | POA: Diagnosis not present

## 2021-07-13 LAB — GLUCOSE, CAPILLARY
Glucose-Capillary: 112 mg/dL — ABNORMAL HIGH (ref 70–99)
Glucose-Capillary: 120 mg/dL — ABNORMAL HIGH (ref 70–99)
Glucose-Capillary: 150 mg/dL — ABNORMAL HIGH (ref 70–99)
Glucose-Capillary: 154 mg/dL — ABNORMAL HIGH (ref 70–99)
Glucose-Capillary: 95 mg/dL (ref 70–99)

## 2021-07-13 LAB — RENAL FUNCTION PANEL
Albumin: 2.9 g/dL — ABNORMAL LOW (ref 3.5–5.0)
Anion gap: 14 (ref 5–15)
BUN: 43 mg/dL — ABNORMAL HIGH (ref 6–20)
CO2: 22 mmol/L (ref 22–32)
Calcium: 8.9 mg/dL (ref 8.9–10.3)
Chloride: 95 mmol/L — ABNORMAL LOW (ref 98–111)
Creatinine, Ser: 6.47 mg/dL — ABNORMAL HIGH (ref 0.44–1.00)
GFR, Estimated: 7 mL/min — ABNORMAL LOW (ref >60)
Glucose, Bld: 112 mg/dL — ABNORMAL HIGH (ref 70–99)
Phosphorus: 5.1 mg/dL — ABNORMAL HIGH (ref 2.5–4.6)
Potassium: 5.6 mmol/L — ABNORMAL HIGH (ref 3.5–5.1)
Sodium: 131 mmol/L — ABNORMAL LOW (ref 135–145)

## 2021-07-13 MED ORDER — DARBEPOETIN ALFA 60 MCG/0.3ML IJ SOSY
PREFILLED_SYRINGE | INTRAMUSCULAR | Status: AC
  Filled 2021-07-13: qty 0.3

## 2021-07-13 NOTE — Progress Notes (Signed)
This chaplain responded to PMT referral for spiritual care.  The chaplain understands the Pt. has recently experienced the loss of four family members.  The Pt. shares a smile as I enter the room. The chaplain understands the Pt. just completed PT and is experiencing some pain.   The chaplain listened reflectively as the Pt. shared the legacy of her father's ministry and the hope her father is nurturing through his spirituality.    The chaplain understands prayer and meditation are coping skills the Pt. uses for emotional and physical pain.    The Pt. accepted the chaplain's invitation for prayer and F/U spiritual care.

## 2021-07-13 NOTE — Progress Notes (Signed)
Pt placed on CPAP for night rest with oxygen bled in.  Tolerating well at this time.

## 2021-07-13 NOTE — Progress Notes (Signed)
[facility] Progress Note   Outpatient orders: [facility], Monday Wednesday Friday, 4hr 15 minutes.  350 BFR/500 DFR  1K, 2.5 calcium, 138 sodium, EDW 135.5 kg, 15-gauge needles, machine temp 37  Assessment/ Plan:   ESRD:  - will plan for routine HD today Monday via AVF-  eventually work toward doing HD up in chair   Acute on chronic RV failure with shock, pulmonary HTN -CTA r/o'd PE -TTE with normal EF, mod LVH and severe RV dysfunction -RHC with elevated RA pressure (22), no marked change in pressures or CO with AVF occlusion -Her AVF does not appear to be very high flow.  Will  not ligate it. -on midodrine 15 TID -sildenafil stopped in light of ongoing hypotension UF as able with HD-  using low temp- albumin and profile to get volume off-  will stop using albumin as they cannnot give as OP but on max midodrine   Volume/ hypertension: EDW 135.5kg. UF with HD.  Over EDW still but making progress, achieved 2.5 liters with last HD-  will continue to push-  lowest  weight recorded at 134 kg   Anemia of Chronic Kidney Disease: Hb in 10-11s without ESA here to date. Now dipped under 10-  have added ESA   Secondary Hyperparathyroidism/Hyperphosphatemia: sensipar 60 qtx -  phos OK on phoslo -   DM2 with hyperglycemia: mgmt per primary service  Subjective:   Feeling better. Denies f/cn/v/cp.   Objective:   BP 126/73 (BP Location: Right Wrist)   Pulse 71   Temp 98.3 F (36.8 C) (Oral)   Resp 18   Ht _0  (1.651 m)   Wt 134 kg   SpO2 97%   BMI 49.16 kg/m   Intake/Output Summary (Last 24 hours) at 07/13/2021 1242 Last data filed at 07/13/2021 1062 Gross per 24 hour  Intake 237 ml  Output --  Net 237 ml   Weight change:   Physical Exam: Gen:nad, sitting up in bed CVS:s1s2, rrr Resp: normal wob IRS:WNIOE, soft, nt VOJ:[id number] bilateral LE's w/ chronic skin changes Neuro: awake, alert, moves all ext spontaneously, speech clear and coherent HD  access: lue avf +b/t  Imaging: No results found.  Labs: BMET Recent Labs  Lab 07/08/21 0410 07/09/21 0035 07/10/21 0043 07/10/21 1316 07/11/21 0108 07/12/21 0051 07/13/21 0036  NA 134* 135 135 133* 132* 133* 131*  K 4.6 4.1 4.5 4.0 4.3 5.1 5.6*  CL 97* 98 95* 97* 97* 94* 95*  CO2 _1 GLUCOSE 134* 143* 149* 144* 131* 104* 112*  BUN 34* 24* 33* 17 24* 31* 43*  CREATININE 5.96* 4.64* 5.79* 3.72* 4.45* 5.69* 6.47*  CALCIUM 8.7* 8.7* 8.7* 8.8* 9.1 9.1 8.9  PHOS 4.6 3.6 4.1 3.1 3.9 4.7* 5.1*   CBC Recent Labs  Lab 07/10/21 0043 07/10/21 1316 07/11/21 0108 07/12/21 0051  WBC 9.9 10.1 9.4 9.9  HGB 9.8* 9.7* 10.1* 10.4*  HCT 30.9* 31.0* 32.4* 33.0*  MCV 99.0 98.7 99.4 99.4  PLT 194 178 182 189    Medications:     amiodarone  200 mg Oral Daily   apixaban  5 mg Oral BID   calcium acetate  667 mg Oral TID WC   Chlorhexidine Gluconate Cloth  6 each Topical Daily   Chlorhexidine Gluconate Cloth  6 each Topical Q0600   darbepoetin (ARANESP) injection - DIALYSIS  60 mcg Intravenous Q Mon-HD   DULoxetine  20 mg Oral Daily   feeding  supplement (NEPRO CARB STEADY)  237 mL Oral BID BM   insulin aspart  0-15 Units Subcutaneous TID WC   insulin aspart  0-5 Units Subcutaneous QHS   mouth rinse  15 mL Mouth Rinse BID   midodrine  15 mg Oral TID WC   multivitamin  1 tablet Oral QHS   polyethylene glycol  17 g Oral Daily   senna  1 tablet Oral Daily   sodium chloride flush  3 mL Intravenous Q12H      Otelia Santee, MD 07/13/2021, 12:42 PM

## 2021-07-13 NOTE — Progress Notes (Signed)
Transported to the hemodialysis unit by bed , stable.

## 2021-07-13 NOTE — Progress Notes (Signed)
Physical Therapy Treatment Patient Details Name: [name] MRN: [id number] DOB: 14-Sep-1966 Today's Date: 07/13/2021    History of Present Illness 55 y.o. female presenting from [place] ED 7/3 with lethargy, hypotension and elevated lactic acid after d/c from [facility] earlier that day with septic shock and cellulitis. Patient found to have AMS, septic shock, acute respiratory failure, acute on chronic RV failure. Transferred to ICU on 7/12 due to hypotension. PMHX significant for obesity, DMII, COPD, CAD, lymphedema, ESRD on HD MWF and CHF.    PT Comments    Pt tolerated treatment well and mentioned having less pain due to premedication prior to session. VSS throughout (see below). Pt demonstrated decreased assist with bed mobility (supine to sit), using rails and pads and increased time in standing compared to previous session (max 20s), with trunk flexion and reliance on stedy. Pt attempted to weight shift in seated position but was unsuccessful. Progress to standing upright and weight shifting/side stepping next session. Pre-vitals - BP 104/61(74), HR 80, spO2 100% on 3L Post-vitals - HR 92  Follow Up Recommendations  SNF     Equipment Recommendations  Hospital bed;Other (comment)    Recommendations for Other Services       Precautions / Restrictions Precautions Precautions: Fall Precaution Comments: bil LE edema Restrictions Weight Bearing Restrictions: No    Mobility  Bed Mobility Overal bed mobility: Needs Assistance Bed Mobility: Supine to Sit;Rolling;Sit to Supine Rolling: Max assist   Supine to sit: +2 for physical assistance;Mod assist Sit to supine: +2 for physical assistance;Max assist   General bed mobility comments: mod A +2 with assist of pad and rail supine to sit. Crossed and moved bil LEs to roll. max A +2 sit to supine    Transfers Overall transfer level: Needs assistance Equipment used: 2 person hand held assist Transfers: Sit to/from Stand Sit to Stand:  Mod assist;+2 physical assistance         General transfer comment: Provided bil knee block +2 with assist from pad. RN sat in stedy to weight it for pt to pull up on stedy to stand. Pt stood 10, 5, 20, 10 sec respectively with bil UE on stedy bar with trunk flexion and full knee extension. Mod A to scoot back onto bed using rails and pad.  Ambulation/Gait             General Gait Details: unable   Stairs             Wheelchair Mobility    Modified Rankin (Stroke Patients Only)       Balance Overall balance assessment: Needs assistance Sitting-balance support: Bilateral upper extremity supported Sitting balance-Leahy Scale: Fair                                      Cognition Arousal/Alertness: Awake/alert Behavior During Therapy: WFL for tasks assessed/performed Overall Cognitive Status: Within Functional Limits for tasks assessed                                 General Comments: Responded accurately to commands      Exercises      General Comments        Pertinent Vitals/Pain VSS Faces Pain Scale: Hurts little more Pain Location: LEs Pain Descriptors / Indicators: Moaning Pain Intervention(s): Repositioned;Premedicated before session;Monitored during session    Home  Living                      Prior Function            PT Goals (current goals can now be found in the care plan section) Acute Rehab PT Goals Patient Stated Goal: get stronger and return home PT Goal Formulation: With patient Time For Goal Achievement: 07/23/21 Potential to Achieve Goals: Fair Progress towards PT goals: Progressing toward goals    Frequency    Min 2X/week      PT Plan Current plan remains appropriate    Co-evaluation              AM-PAC PT "6 Clicks" Mobility   Outcome Measure  Help needed turning from your back to your side while in a flat bed without using bedrails?: Total Help needed moving from  lying on your back to sitting on the side of a flat bed without using bedrails?: Total Help needed moving to and from a bed to a chair (including a wheelchair)?: Total Help needed standing up from a chair using your arms (e.g., wheelchair or bedside chair)?: Total Help needed to walk in hospital room?: Total Help needed climbing 3-5 steps with a railing? : Total 6 Click Score: 6    End of Session Equipment Utilized During Treatment: Gait belt;Oxygen Activity Tolerance: Patient tolerated treatment well Patient left: in bed;with bed alarm set;with call bell/phone within reach Nurse Communication: Mobility status;Need for lift equipment PT Visit Diagnosis: Other abnormalities of gait and mobility (R26.89);Muscle weakness (generalized) (M62.81);Pain Pain - Right/Left: Left Pain - part of body: Leg     Time: 8366-2947 PT Time Calculation (min) (ACUTE ONLY): 25 min  Charges:  $Therapeutic Activity: 23-37 mins                     Louie Casa, SPT Acute Rehab: [phone]    Domingo Dimes 07/13/2021, 11:45 AM

## 2021-07-13 NOTE — Progress Notes (Signed)
Back from dialysis by bed awake and alert.

## 2021-07-13 NOTE — Progress Notes (Signed)
PROGRESS NOTE    [name]  BSW:[id number] DOB: [birth year] DOA: 06/21/2021 PCP: Noreene Larsson, NP    Brief Narrative:  55 year old female with a history of end-stage renal disease on hemodialysis, has had a prolonged hospitalization since she was admitted on 06/21/2021 for cellulitis and concerns for sepsis.  She was treated with antibiotics, but had continued issues with hypotension.  Upon further evaluation, it was felt that her hypotension was more related to cardiogenic shock from RV failure and pulmonary hypertension.  She is being followed by cardiology, nephrology and PCCM.  She is intermittently required vasopressors for hypotension.  She has been off vasopressors since 7/20 and has tolerated hemodialysis.  Currently on midodrine.  Transferred to [facility] service on 7/23   Assessment & Plan:   Principal Problem:   Hypotension Active Problems:   Lymphedema of lower extremity   Morbid obesity (HCC)   Obesity hypoventilation syndrome (HCC)   Cellulitis   ESRD on dialysis ([place])   Elevated troponin   Type 2 diabetes mellitus with hyperglycemia (HCC)   Cardiogenic shock (HCC)   Bilateral calf pain   Pressure injury of skin   Cardiogenic shock due to RV failure Pulmonary hypertension Acute on chronic diastolic congestive heart failure -Patient had been intermittently requiring vasopressors for hypotension during dialysis -Currently on midodrine, will tolerate SBP greater than 85 as long as she is not symptomatic -She has been off vasopressors since 7/20 and has tolerated her last dialysis session -Initially had a trial of sildenafil, but this was held due to hypotension -She is also noted to have a large AV fistula -Concerns that fistula may be contributing to hypotension and low cardiac output -Patient underwent right heart cath, her fistula was temporarily occluded, but this did not result in increased cardiac output or change in PA pressure.  Therefore ligation of fistula was  not recommended at this time -Further volume removal with next dialysis session  Intermittent atypical atrial flutter, now in sinus rhythm -Status post DCCV on 7/15 -Continue on amiodarone and Eliquis  End-stage renal disease -Nephrology following -Currently, appears to be tolerating IHD -We will need to make sure she is able to sit up in chair for dialysis prior to considering discharge  Suspected OSA -Likely contributing to pulmonary hypertension -She has been using CPAP nightly -We will need outpatient sleep study  Fibromyalgia -Started on Cymbalta  Cellulitis of left lower extremity -Completed course of antibiotics with daptomycin  Anemia of chronic kidney disease -Hemoglobin currently stable -Receives ESA with dialysis  DM2 with hyperglycemia -Blood sugars currently stable -Last A1c from 06/22/2021 was 6.9 -Continue on sliding scale insulin  Generalized weakness -Will need skilled nursing facility placement  Goals of care -Patient has multiple medical problems and with her recurrent hypotension, severe RV failure and difficulty tolerating dialysis, her long-term prognosis appears to be poor at this time -I broached the subject of goals of care, she immediately says that she is not ready for hospice -We will request palliative care consultation to help initiate other conversations including CODE STATUS   DVT prophylaxis: SCDs Start: 06/22/21 0149 apixaban (ELIQUIS) tablet 5 mg  Code Status: Full code Family Communication: Discussed with patient Disposition Plan: Status is: Inpatient  Remains inpatient appropriate because:Ongoing diagnostic testing needed not appropriate for outpatient work up, IV treatments appropriate due to intensity of illness or inability to take PO, and Inpatient level of care appropriate due to severity of illness  Dispo: The patient is from: Home  Anticipated d/c is to: SNF              Patient currently is not medically stable  to d/c.   Difficult to place patient No         Consultants:  PCCM (signed off) Cardiology, advanced heart failure (signed off) Nephrology  Procedures:  7/4 echocardiogram 7/14 right heart cath 7/15 TEE cardioversion  Antimicrobials:      Subjective: Still has some pain in her legs, feels it is from the swelling, no other complaints  Objective: Vitals:   07/12/21 2358 07/13/21 0354 07/13/21 0800 07/13/21 1134  BP: 110/70 128/71 (!) 92/52 126/73  Pulse: 68 70 66 71  Resp: _0 Temp: (!) 96.2 F (35.7 C) 98.3 F (36.8 C)  98.3 F (36.8 C)  TempSrc: Axillary Oral  Oral  SpO2: 92% 97% 96% 97%  Weight:      Height:        Intake/Output Summary (Last 24 hours) at 07/13/2021 1341 Last data filed at 07/13/2021 0933 Gross per 24 hour  Intake 237 ml  Output --  Net 237 ml   Filed Weights   07/09/21 0500 07/11/21 0624 07/12/21 0400  Weight: (!) 137 kg 134 kg 134 kg    Examination:  General exam: Alert, awake, oriented x 3 Respiratory system: Clear to auscultation. Respiratory effort normal. Cardiovascular system:RRR. No murmurs, rubs, gallops. Gastrointestinal system: Abdomen is nondistended, soft and nontender. No organomegaly or masses felt. Normal bowel sounds heard. Central nervous system: Alert and oriented. No focal neurological deficits. Extremities: b/l LE lymphedema with unna boots Skin: No rashes, lesions or ulcers Psychiatry: Judgement and insight appear normal. Mood & affect appropriate.       Data Reviewed: I have personally reviewed following labs and imaging studies  CBC: Recent Labs  Lab 07/09/21 0035 07/10/21 0043 07/10/21 1316 07/11/21 0108 07/12/21 0051  WBC 10.0 9.9 10.1 9.4 9.9  HGB 10.5* 9.8* 9.7* 10.1* 10.4*  HCT 33.1* 30.9* 31.0* 32.4* 33.0*  MCV 99.1 99.0 98.7 99.4 99.4  PLT 203 194 178 182 599   Basic Metabolic Panel: Recent Labs  Lab 07/09/21 0616 07/10/21 0043 07/10/21 1316 07/11/21 0108 07/12/21 0051  07/13/21 0036  NA  --  135 133* 132* 133* 131*  K  --  4.5 4.0 4.3 5.1 5.6*  CL  --  95* 97* 97* 94* 95*  CO2  --  _1 GLUCOSE  --  149* 144* 131* 104* 112*  BUN  --  33* 17 24* 31* 43*  CREATININE  --  5.79* 3.72* 4.45* 5.69* 6.47*  CALCIUM  --  8.7* 8.8* 9.1 9.1 8.9  MG 2.1  --   --   --   --   --   PHOS  --  4.1 3.1 3.9 4.7* 5.1*   GFR: Estimated Creatinine Clearance: 13.8 mL/min (A) (by C-G formula based on SCr of 6.47 mg/dL (H)). Liver Function Tests: Recent Labs  Lab 07/10/21 0043 07/10/21 1316 07/11/21 0108 07/12/21 0051 07/13/21 0036  ALBUMIN 2.9* 3.2* 3.1* 3.0* 2.9*   No results for input(s): LIPASE, AMYLASE in the last 168 hours. No results for input(s): AMMONIA in the last 168 hours. Coagulation Profile: No results for input(s): INR, PROTIME in the last 168 hours. Cardiac Enzymes: No results for input(s): CKTOTAL, CKMB, CKMBINDEX, TROPONINI in the last 168 hours. BNP (last 3 results) No results for input(s): PROBNP in the last 8760 hours. HbA1C: No results  for input(s): HGBA1C in the last 72 hours. CBG: Recent Labs  Lab 07/12/21 1127 07/12/21 1542 07/12/21 2103 07/13/21 0622 07/13/21 1132  GLUCAP 113* 120* 178* 120* 154*   Lipid Profile: No results for input(s): CHOL, HDL, LDLCALC, TRIG, CHOLHDL, LDLDIRECT in the last 72 hours. Thyroid Function Tests: No results for input(s): TSH, T4TOTAL, FREET4, T3FREE, THYROIDAB in the last 72 hours. Anemia Panel: No results for input(s): VITAMINB12, FOLATE, FERRITIN, TIBC, IRON, RETICCTPCT in the last 72 hours. Sepsis Labs: No results for input(s): PROCALCITON, LATICACIDVEN in the last 168 hours.  No results found for this or any previous visit (from the past 240 hour(s)).       Radiology Studies: No results found.      Scheduled Meds:  amiodarone  200 mg Oral Daily   apixaban  5 mg Oral BID   calcium acetate  667 mg Oral TID WC   Chlorhexidine Gluconate Cloth  6 each Topical Daily    Chlorhexidine Gluconate Cloth  6 each Topical Q0600   darbepoetin (ARANESP) injection - DIALYSIS  60 mcg Intravenous Q Mon-HD   DULoxetine  20 mg Oral Daily   feeding supplement (NEPRO CARB STEADY)  237 mL Oral BID BM   insulin aspart  0-15 Units Subcutaneous TID WC   insulin aspart  0-5 Units Subcutaneous QHS   mouth rinse  15 mL Mouth Rinse BID   midodrine  15 mg Oral TID WC   multivitamin  1 tablet Oral QHS   polyethylene glycol  17 g Oral Daily   senna  1 tablet Oral Daily   sodium chloride flush  3 mL Intravenous Q12H   Continuous Infusions:  sodium chloride     sodium chloride     promethazine (PHENERGAN) injection (IM or IVPB) Stopped (07/02/21 1831)     LOS: 22 days    Time spent: 52mns    JKathie Dike MD [facility]   If 7PM-7AM, please contact night-coverage [email or web address]  07/13/2021, 1:41 PM

## 2021-07-13 NOTE — Progress Notes (Signed)
   Per Dr Aundra Dubin  Heart failure team will sign off as of 07/13/21  Follow up as an outpatient: Please call to set up follow up.   Aerika Groll NP-C  7:12 AM

## 2021-07-14 DIAGNOSIS — E1165 Type 2 diabetes mellitus with hyperglycemia: Secondary | ICD-10-CM

## 2021-07-14 DIAGNOSIS — M79661 Pain in right lower leg: Secondary | ICD-10-CM

## 2021-07-14 DIAGNOSIS — L03115 Cellulitis of right lower limb: Secondary | ICD-10-CM | POA: Diagnosis not present

## 2021-07-14 DIAGNOSIS — M79662 Pain in left lower leg: Secondary | ICD-10-CM

## 2021-07-14 DIAGNOSIS — R570 Cardiogenic shock: Secondary | ICD-10-CM | POA: Diagnosis not present

## 2021-07-14 DIAGNOSIS — I9589 Other hypotension: Secondary | ICD-10-CM | POA: Diagnosis not present

## 2021-07-14 LAB — GLUCOSE, CAPILLARY
Glucose-Capillary: 110 mg/dL — ABNORMAL HIGH (ref 70–99)
Glucose-Capillary: 185 mg/dL — ABNORMAL HIGH (ref 70–99)
Glucose-Capillary: 123 mg/dL — ABNORMAL HIGH (ref 70–99)
Glucose-Capillary: 124 mg/dL — ABNORMAL HIGH (ref 70–99)

## 2021-07-14 LAB — RENAL FUNCTION PANEL
Albumin: 2.9 g/dL — ABNORMAL LOW (ref 3.5–5.0)
Anion gap: 10 (ref 5–15)
BUN: 33 mg/dL — ABNORMAL HIGH (ref 6–20)
CO2: 29 mmol/L (ref 22–32)
Calcium: 9 mg/dL (ref 8.9–10.3)
Chloride: 96 mmol/L — ABNORMAL LOW (ref 98–111)
Creatinine, Ser: 5.46 mg/dL — ABNORMAL HIGH (ref 0.44–1.00)
GFR, Estimated: 9 mL/min — ABNORMAL LOW (ref >60)
Glucose, Bld: 107 mg/dL — ABNORMAL HIGH (ref 70–99)
Phosphorus: 4.3 mg/dL (ref 2.5–4.6)
Potassium: 4.6 mmol/L (ref 3.5–5.1)
Sodium: 135 mmol/L (ref 135–145)

## 2021-07-14 MED ORDER — DM-GUAIFENESIN ER 30-600 MG PO TB12: 1.0000 | ORAL_TABLET | Freq: Two times a day (BID) | ORAL | Status: DC | PRN

## 2021-07-14 MED ORDER — IPRATROPIUM-ALBUTEROL 0.5-2.5 (3) MG/3ML IN SOLN: 3.0000 mL | RESPIRATORY_TRACT | Status: DC | PRN

## 2021-07-14 NOTE — TOC Progression Note (Signed)
Transition of Care (TOC) - Progression Note  Heart Failure   Patient Details  Name: [name] MRN: [id number] Date of Birth: 05-Jul-1966  Transition of Care [facility]) CM/SW [place], [place] Phone Number: 07/14/2021, 5:12 PM  Clinical Narrative:    Insurance authorization started on [employer] portal authorization ID# [id number]. Pending insurance approval for SNF at [facility] in [place], [place].   Expected Discharge Plan: [place] Barriers to Discharge: Continued Medical Work up, Ship broker  Expected Discharge Plan and Services Expected Discharge Plan: [place] In-house Referral: Clinical Social Work   Post Acute Care Choice: [place] Living arrangements for the past 2 months: Single Family Home                                       Social Determinants of Health (SDOH) Interventions Food Insecurity Interventions: Intervention Not Indicated Financial Strain Interventions: Intervention Not Indicated Housing Interventions: Intervention Not Indicated Transportation Interventions: Intervention Not Indicated  Readmission Risk Interventions Readmission Risk Prevention Plan 04/30/2019  Transportation Screening Complete  Medication Review Press photographer) Complete  PCP or Specialist appointment within 3-5 days of discharge Complete  HRI or [place] Complete  SW Recovery Care/Counseling Consult Complete  [place] Not Applicable  Some recent data might be hidden   Sila Sarsfield, MSW, LCSWA [phone] Heart Failure Social Worker

## 2021-07-14 NOTE — Progress Notes (Signed)
PROGRESS NOTE    [name]  YIR:[id number] DOB: [birth year] DOA: 06/21/2021 PCP: Noreene Larsson, NP   Brief Narrative:  55 year old female with a history of end-stage renal disease on hemodialysis, has had a prolonged hospitalization since she was admitted on 06/21/2021 for cellulitis and concerns for sepsis.  She was treated with antibiotics, but had continued issues with hypotension.  Upon further evaluation, it was felt that her hypotension was more related to cardiogenic shock from RV failure and pulmonary hypertension group 3.  She is being followed by cardiology, nephrology and PCCM.  She is intermittently required vasopressors for hypotension.  She has been off vasopressors since 7/20 and has tolerated hemodialysis.  Currently on midodrine.  Transferred to [facility] service on 7/23     Assessment & Plan:   Principal Problem:   Hypotension Active Problems:   Lymphedema of lower extremity   Morbid obesity (HCC)   Obesity hypoventilation syndrome (HCC)   Cellulitis   ESRD on dialysis ([place])   Elevated troponin   Type 2 diabetes mellitus with hyperglycemia (HCC)   Cardiogenic shock (HCC)   Bilateral calf pain   Pressure injury of skin      Cardiogenic shock due to RV failure Pulmonary hypertension Acute on chronic diastolic congestive heart failure -Currently on midodrine 12m tid.  Off vasopressors since 7/20 tolerating HD sessions - Has large AV fistula but occluded therefore no ligation necessary - CHF team - Signed off - Sildenafil stopped   Intermittent atypical atrial flutter, now in sinus rhythm -Status post cardioversion 7/15.  On amiodarone and Eliquis   End-stage renal disease -Nephrology following.  HD per their discretion - Needs to sit up in chair   Suspected OSA -Bedtime CPAP.  Will need outpatient sleep study   Fibromyalgia -Continue Cymbalta   Cellulitis of left lower extremity -Completed course of daptomycin   Anemia of chronic kidney disease -No  evidence of bleeding.  Hemoglobin stable.  Continue Aranesp   DM2 with hyperglycemia -A1c 6.9.  Sliding scale and Accu-Chek   Generalized weakness -PT recommends SNF placement   Goals of care -Due to multiple medical comorbidities and acute ongoing issues.  Palliative care team was consulted.  Patient wishes to be full code with full scope of treatment, hopeful for improvement and going to rehab.    DVT prophylaxis: Eliquis Code Status: Full code Family Communication: Spoke with husband over FaceTime while I was in the room  Status is: Inpatient  Remains inpatient appropriate because:Inpatient level of care appropriate due to severity of illness  Dispo: The patient is from: Home              Anticipated d/c is to: SNF              Patient currently is not medically stable to d/c.  Awaiting clearance by nephrology and arrangements for outpatient HD   Difficult to place patient No       Nutritional status  Nutrition Problem: Increased nutrient needs Etiology: wound healing  Signs/Symptoms: estimated needs  Interventions: Nepro shake  Body mass index is 48.79 kg/m.  Pressure Injury 07/04/21 Thigh Posterior;Proximal;Right Stage 2 -  Partial thickness loss of dermis presenting as a shallow open injury with a red, pink wound bed without slough. (Active)  07/04/21 1200  Location: Thigh  Location Orientation: Posterior;Proximal;Right  Staging: Stage 2 -  Partial thickness loss of dermis presenting as a shallow open injury with a red, pink wound bed without slough.  Wound Description (  Comments):   Present on Admission: No     Subjective: Feeling better does not have any new complaints.  Does not use any oxygen at home but currently she is on 3 L nasal cannula.  Review of Systems Otherwise negative except as per HPI, including: General: Denies fever, chills, night sweats or unintended weight loss. Resp: Denies cough, wheezing, shortness of breath. Cardiac: Denies  chest pain, palpitations, orthopnea, paroxysmal nocturnal dyspnea. GI: Denies abdominal pain, nausea, vomiting, diarrhea or constipation GU: Denies dysuria, frequency, hesitancy or incontinence MS: Denies muscle aches, joint pain or swelling Neuro: Denies headache, neurologic deficits (focal weakness, numbness, tingling), abnormal gait Psych: Denies anxiety, depression, SI/HI/AVH Skin: Denies new rashes or lesions ID: Denies sick contacts, exotic exposures, travel  Examination:  Constitutional: Not in acute distress, 3 L nasal cannula Respiratory: Clear to auscultation bilaterally Cardiovascular: Normal sinus rhythm, no rubs Abdomen: Nontender nondistended good bowel sounds Musculoskeletal: No edema noted Skin left upper extremity AV fistula in place Neurologic: CN 2-12 grossly intact.  And nonfocal Psychiatric: Normal judgment and insight. Alert and oriented x 3. Normal mood.   Objective: Vitals:   07/13/21 2319 07/13/21 2351 07/14/21 0409 07/14/21 0739  BP:  106/62 (!) 114/57 106/60  Pulse: 82 69 80 74  Resp: _0 Temp:  97.6 F (36.4 C) 98.1 F (36.7 C) 98 F (36.7 C)  TempSrc:  Axillary Oral Oral  SpO2: 96% 94% 95% 93%  Weight:   133 kg   Height:        Intake/Output Summary (Last 24 hours) at 07/14/2021 0743 Last data filed at 07/14/2021 0600 Gross per 24 hour  Intake 550 ml  Output 3971 ml  Net -3421 ml   Filed Weights   07/12/21 0400 07/13/21 1339 07/14/21 0409  Weight: 134 kg 134 kg 133 kg     Data Reviewed:   CBC: Recent Labs  Lab 07/09/21 0035 07/10/21 0043 07/10/21 1316 07/11/21 0108 07/12/21 0051  WBC 10.0 9.9 10.1 9.4 9.9  HGB 10.5* 9.8* 9.7* 10.1* 10.4*  HCT 33.1* 30.9* 31.0* 32.4* 33.0*  MCV 99.1 99.0 98.7 99.4 99.4  PLT 203 194 178 182 711   Basic Metabolic Panel: Recent Labs  Lab 07/09/21 0616 07/10/21 0043 07/10/21 1316 07/11/21 0108 07/12/21 0051 07/13/21 0036 07/14/21 0046  NA  --    < > 133* 132* 133* 131* 135  K   --    < > 4.0 4.3 5.1 5.6* 4.6  CL  --    < > 97* 97* 94* 95* 96*  CO2  --    < > _1 GLUCOSE  --    < > 144* 131* 104* 112* 107*  BUN  --    < > 17 24* 31* 43* 33*  CREATININE  --    < > 3.72* 4.45* 5.69* 6.47* 5.46*  CALCIUM  --    < > 8.8* 9.1 9.1 8.9 9.0  MG 2.1  --   --   --   --   --   --   PHOS  --    < > 3.1 3.9 4.7* 5.1* 4.3   < > = values in this interval not displayed.   GFR: Estimated Creatinine Clearance: 16.3 mL/min (A) (by C-G formula based on SCr of 5.46 mg/dL (H)). Liver Function Tests: Recent Labs  Lab 07/10/21 1316 07/11/21 0108 07/12/21 0051 07/13/21 0036 07/14/21 0046  ALBUMIN 3.2* 3.1* 3.0* 2.9* 2.9*  No results for input(s): LIPASE, AMYLASE in the last 168 hours. No results for input(s): AMMONIA in the last 168 hours. Coagulation Profile: No results for input(s): INR, PROTIME in the last 168 hours. Cardiac Enzymes: No results for input(s): CKTOTAL, CKMB, CKMBINDEX, TROPONINI in the last 168 hours. BNP (last 3 results) No results for input(s): PROBNP in the last 8760 hours. HbA1C: No results for input(s): HGBA1C in the last 72 hours. CBG: Recent Labs  Lab 07/13/21 1132 07/13/21 1750 07/13/21 2043 07/13/21 2352 07/14/21 0600  GLUCAP 154* 95 150* 112* 110*   Lipid Profile: No results for input(s): CHOL, HDL, LDLCALC, TRIG, CHOLHDL, LDLDIRECT in the last 72 hours. Thyroid Function Tests: No results for input(s): TSH, T4TOTAL, FREET4, T3FREE, THYROIDAB in the last 72 hours. Anemia Panel: No results for input(s): VITAMINB12, FOLATE, FERRITIN, TIBC, IRON, RETICCTPCT in the last 72 hours. Sepsis Labs: No results for input(s): PROCALCITON, LATICACIDVEN in the last 168 hours.  No results found for this or any previous visit (from the past 240 hour(s)).       Radiology Studies: No results found.      Scheduled Meds:  amiodarone  200 mg Oral Daily   apixaban  5 mg Oral BID   calcium acetate  667 mg Oral TID WC    Chlorhexidine Gluconate Cloth  6 each Topical Daily   Chlorhexidine Gluconate Cloth  6 each Topical Q0600   darbepoetin (ARANESP) injection - DIALYSIS  60 mcg Intravenous Q Mon-HD   DULoxetine  20 mg Oral Daily   feeding supplement (NEPRO CARB STEADY)  237 mL Oral BID BM   insulin aspart  0-15 Units Subcutaneous TID WC   insulin aspart  0-5 Units Subcutaneous QHS   mouth rinse  15 mL Mouth Rinse BID   midodrine  15 mg Oral TID WC   multivitamin  1 tablet Oral QHS   polyethylene glycol  17 g Oral Daily   senna  1 tablet Oral Daily   sodium chloride flush  3 mL Intravenous Q12H   Continuous Infusions:  sodium chloride     sodium chloride     promethazine (PHENERGAN) injection (IM or IVPB) Stopped (07/02/21 1831)     LOS: 23 days   Time spent= 35 mins    Herbert Aguinaldo Arsenio Loader, MD [facility]  If 7PM-7AM, please contact night-coverage  07/14/2021, 7:43 AM

## 2021-07-14 NOTE — Progress Notes (Signed)
Occupational Therapy Treatment Patient Details Name: [name] MRN: [id number] DOB: 1966-04-27 Today's Date: 07/14/2021    History of present illness 55 y.o. female presenting from [facility] ED 7/3 with lethargy, hypotension and elevated lactic acid after d/c from [facility] earlier that day with septic shock and cellulitis. Patient found to have AMS, septic shock, acute respiratory failure, acute on chronic RV failure. Transferred to ICU on 7/12 due to hypotension. PMHX significant for obesity, DMII, COPD, CAD, lymphedema, ESRD on HD MWF and CHF.   OT comments  Patient supine in bed and agreeable to OT session.  Completing bed mobility with decreased assistance today, mod assist +2 safety (2nd person present but only providing guiding assistance of trunk) given increased time. EOB engaged in simulated bathing with max assist (applying lotion) and UB exercises for strengthening and dynamic balance with supervision.  Plan for standing trials next session as tolerated.  Will follow acutely.    Follow Up Recommendations  SNF;Supervision/Assistance - 24 hour    Equipment Recommendations  Other (comment) (defer to SNF)    Recommendations for Other Services      Precautions / Restrictions Precautions Precautions: Fall Precaution Comments: bil LE edema Restrictions Weight Bearing Restrictions: No       Mobility Bed Mobility Overal bed mobility: Needs Assistance Bed Mobility: Supine to Sit;Sit to Supine     Supine to sit: Mod assist;+2 for safety/equipment Sit to supine: Mod assist;+2 for safety/equipment   General bed mobility comments: mod assist for BLEs towards EOB with +2 safety at trunk due to increased time transitioning to EOB. Returned to supine with full assist for LB and guiding support for trunk; pt repositioned in bed using rails with min assist +2    Transfers                 General transfer comment: deferred    Balance Overall balance assessment: Needs  assistance Sitting-balance support: No upper extremity supported;Feet unsupported Sitting balance-Leahy Scale: Fair Sitting balance - Comments: supervision dynamically                                   ADL either performed or assessed with clinical judgement   ADL Overall ADL's : Needs assistance/impaired             Lower Body Bathing: Maximal assistance;Sitting/lateral leans Lower Body Bathing Details (indicate cue type and reason): applied lotion of B upper legs but would require assist for remainder of LB bathing (simulated with lotion)     Lower Body Dressing: Total assistance;Sitting/lateral leans               Functional mobility during ADLs: Maximal assistance;+2 for physical assistance;+2 for safety/equipment       Vision       Perception     Praxis      Cognition Arousal/Alertness: Awake/alert Behavior During Therapy: WFL for tasks assessed/performed Overall Cognitive Status: Within Functional Limits for tasks assessed                                          Exercises Exercises: Other exercises Other Exercises Other Exercises: theraband exercises at EOB: x 10 reps 1 set of foward punch, elbow extensions, and side punches   Shoulder Instructions       General Comments VSS on 2L  [name]    Pertinent Vitals/ Pain       Pain Assessment: Faces Faces Pain Scale: Hurts a little bit Pain Location: LEs Pain Descriptors / Indicators: Discomfort Pain Intervention(s): Monitored during session;Repositioned  Home Living Family/patient expects to be discharged to:: Private residence Living Arrangements: Spouse/significant other                                      Prior Functioning/Environment              Frequency  Min 2X/week        Progress Toward Goals  OT Goals(current goals can now be found in the care plan section)  Progress towards OT goals: Progressing toward goals  Acute Rehab OT  Goals Patient Stated Goal: get stronger and return home OT Goal Formulation: With patient  Plan Discharge plan remains appropriate;Frequency remains appropriate    Co-evaluation                 AM-PAC OT "6 Clicks" Daily Activity     Outcome Measure   Help from another person eating meals?: None Help from another person taking care of personal grooming?: A Little Help from another person toileting, which includes using toliet, bedpan, or urinal?: Total Help from another person bathing (including washing, rinsing, drying)?: A Lot Help from another person to put on and taking off regular upper body clothing?: A Little Help from another person to put on and taking off regular lower body clothing?: Total 6 Click Score: 14    End of Session Equipment Utilized During Treatment: Oxygen  OT Visit Diagnosis: Other abnormalities of gait and mobility (R26.89);Muscle weakness (generalized) (M62.81);Pain   Activity Tolerance Patient tolerated treatment well   Patient Left in bed;with call bell/phone within reach;with nursing/sitter in room   Nurse Communication Mobility status        Time: 1572-6203 OT Time Calculation (min): 20 min  Charges: OT General Charges $OT Visit: 1 Visit OT Treatments $Self Care/Home Management : 8-22 mins  Jolaine Artist, OT [place] Pager [phone] Office [phone]    Delight Stare 07/14/2021, 1:02 PM

## 2021-07-14 NOTE — Progress Notes (Signed)
[facility] Progress Note   Outpatient orders: [facility], Monday Wednesday Friday, 4hr 15 minutes.  350 BFR/500 DFR  1K, 2.5 calcium, 138 sodium, EDW 135.5 kg, 15-gauge needles, machine temp 37  Assessment/ Plan:   ESRD  - will plan for routine HD Wed; she tolerated Monday via AVF with net 3971 ml.   eventually need to work toward doing HD up in chair. I'm not convinced she will be able to do that at this time; main issue is transfer from bed to chair.    Acute on chronic RV failure with shock, pulmonary HTN -CTA r/o'd PE -TTE with normal EF, mod LVH and severe RV dysfunction -RHC with elevated RA pressure (22), no marked change in pressures or CO with AVF occlusion -Her AVF does not appear to be very high flow.  Will  not ligate it. -on midodrine 15 TID -sildenafil stopped in light of ongoing hypotension UF as able with HD-  using low temp- albumin and profile to get volume off-  stopped using albumin as they cannnot give as OP but on max midodrine. Tolerated HD Monday with net UF of 4.47 L -> next HD Wed.   Volume/ hypertension: prior EDW 135.5kg. UF with HD.  Over EDW still but making progress, achieved 2.5 liters with last HD-  will continue to push-  lowest  weight recorded at 130-131 kg but she still has a lot of volume onboard   Anemia of Chronic Kidney Disease: Hb in 10-11s without ESA here to date. Now dipped under 10-  have added ESA   Secondary Hyperparathyroidism/Hyperphosphatemia: sensipar 60 qtx -  phos OK on phoslo -   DM2 with hyperglycemia: mgmt per primary service  Subjective:   Feeling better. Denies f/cn/v/cp. Tolerated HD yest; able to sit on side of bed with assistance Open wound in left inner thigh   Objective:   BP 128/74   Pulse 75   Temp 98.5 F (36.9 C) (Oral)   Resp 20   Ht 5' 5" (1.651 m)   Wt 133 kg   SpO2 95%   BMI 48.79 kg/m   Intake/Output Summary (Last 24 hours) at 07/14/2021 1128 Last data filed at 07/14/2021  0700 Gross per 24 hour  Intake 513 ml  Output 3971 ml  Net -3458 ml   Weight change:   Physical Exam: Gen:nad, sitting up in bed CVS:s1s2, rrr Resp: normal wob LVX:BOZWR, soft, nt KYB:TVDFPBHEBB bilateral LE's w/ chronic skin changes Neuro: awake, alert, moves all ext spontaneously, speech clear and coherent HD access: lt Cimino +b/t  Imaging: No results found.  Labs: BMET Recent Labs  Lab 07/09/21 0035 07/10/21 0043 07/10/21 1316 07/11/21 0108 07/12/21 0051 07/13/21 0036 07/14/21 0046  NA 135 135 133* 132* 133* 131* 135  K 4.1 4.5 4.0 4.3 5.1 5.6* 4.6  CL 98 95* 97* 97* 94* 95* 96*  CO2 _0 GLUCOSE 143* 149* 144* 131* 104* 112* 107*  BUN 24* 33* 17 24* 31* 43* 33*  CREATININE 4.64* 5.79* 3.72* 4.45* 5.69* 6.47* 5.46*  CALCIUM 8.7* 8.7* 8.8* 9.1 9.1 8.9 9.0  PHOS 3.6 4.1 3.1 3.9 4.7* 5.1* 4.3   CBC Recent Labs  Lab 07/10/21 0043 07/10/21 1316 07/11/21 0108 07/12/21 0051  WBC 9.9 10.1 9.4 9.9  HGB 9.8* 9.7* 10.1* 10.4*  HCT 30.9* 31.0* 32.4* 33.0*  MCV 99.0 98.7 99.4 99.4  PLT 194 178 182 189    Medications:  amiodarone  200 mg Oral Daily   apixaban  5 mg Oral BID   calcium acetate  667 mg Oral TID WC   Chlorhexidine Gluconate Cloth  6 each Topical Daily   Chlorhexidine Gluconate Cloth  6 each Topical Q0600   darbepoetin (ARANESP) injection - DIALYSIS  60 mcg Intravenous Q Mon-HD   DULoxetine  20 mg Oral Daily   feeding supplement (NEPRO CARB STEADY)  237 mL Oral BID BM   insulin aspart  0-15 Units Subcutaneous TID WC   insulin aspart  0-5 Units Subcutaneous QHS   mouth rinse  15 mL Mouth Rinse BID   midodrine  15 mg Oral TID WC   multivitamin  1 tablet Oral QHS   polyethylene glycol  17 g Oral Daily   senna  1 tablet Oral Daily   sodium chloride flush  3 mL Intravenous Q12H      Otelia Santee, MD 07/14/2021, 11:28 AM

## 2021-07-14 NOTE — Plan of Care (Signed)

## 2021-07-15 ENCOUNTER — Inpatient Hospital Stay (HOSPITAL_COMMUNITY): Payer: 59

## 2021-07-15 DIAGNOSIS — L03115 Cellulitis of right lower limb: Secondary | ICD-10-CM | POA: Diagnosis not present

## 2021-07-15 DIAGNOSIS — R570 Cardiogenic shock: Secondary | ICD-10-CM | POA: Diagnosis not present

## 2021-07-15 DIAGNOSIS — Z20822 Contact with and (suspected) exposure to covid-19: Secondary | ICD-10-CM | POA: Diagnosis not present

## 2021-07-15 DIAGNOSIS — A419 Sepsis, unspecified organism: Secondary | ICD-10-CM | POA: Diagnosis not present

## 2021-07-15 DIAGNOSIS — I132 Hypertensive heart and chronic kidney disease with heart failure and with stage 5 chronic kidney disease, or end stage renal disease: Secondary | ICD-10-CM | POA: Diagnosis not present

## 2021-07-15 DIAGNOSIS — N186 End stage renal disease: Secondary | ICD-10-CM | POA: Diagnosis not present

## 2021-07-15 DIAGNOSIS — I9589 Other hypotension: Secondary | ICD-10-CM | POA: Diagnosis not present

## 2021-07-15 DIAGNOSIS — M79661 Pain in right lower leg: Secondary | ICD-10-CM | POA: Diagnosis not present

## 2021-07-15 LAB — BASIC METABOLIC PANEL
Anion gap: 9 (ref 5–15)
BUN: 39 mg/dL — ABNORMAL HIGH (ref 6–20)
CO2: 26 mmol/L (ref 22–32)
Calcium: 9 mg/dL (ref 8.9–10.3)
Chloride: 94 mmol/L — ABNORMAL LOW (ref 98–111)
Creatinine, Ser: 6.67 mg/dL — ABNORMAL HIGH (ref 0.44–1.00)
GFR, Estimated: 7 mL/min — ABNORMAL LOW (ref >60)
Glucose, Bld: 113 mg/dL — ABNORMAL HIGH (ref 70–99)
Potassium: 4.8 mmol/L (ref 3.5–5.1)
Sodium: 129 mmol/L — ABNORMAL LOW (ref 135–145)

## 2021-07-15 LAB — CBC
HCT: 35 % — ABNORMAL LOW (ref 36.0–46.0)
Hemoglobin: 10.6 g/dL — ABNORMAL LOW (ref 12.0–15.0)
MCH: 30.5 pg (ref 26.0–34.0)
MCHC: 30.3 g/dL (ref 30.0–36.0)
MCV: 100.6 fL — ABNORMAL HIGH (ref 80.0–100.0)
Platelets: 187 10*3/uL (ref 150–400)
RBC: 3.48 MIL/uL — ABNORMAL LOW (ref 3.87–5.11)
RDW: 17.2 % — ABNORMAL HIGH (ref 11.5–15.5)
WBC: 8.9 10*3/uL (ref 4.0–10.5)
nRBC: 0.2 % (ref 0.0–0.2)

## 2021-07-15 LAB — GLUCOSE, CAPILLARY
Glucose-Capillary: 109 mg/dL — ABNORMAL HIGH (ref 70–99)
Glucose-Capillary: 151 mg/dL — ABNORMAL HIGH (ref 70–99)
Glucose-Capillary: 117 mg/dL — ABNORMAL HIGH (ref 70–99)

## 2021-07-15 LAB — APTT: aPTT: 38 seconds — ABNORMAL HIGH (ref 24–36)

## 2021-07-15 LAB — PROTIME-INR
INR: 1.9 — ABNORMAL HIGH (ref 0.8–1.2)
Prothrombin Time: 22.1 seconds — ABNORMAL HIGH (ref 11.4–15.2)

## 2021-07-15 LAB — MAGNESIUM: Magnesium: 2.4 mg/dL (ref 1.7–2.4)

## 2021-07-15 LAB — LACTIC ACID, PLASMA
Lactic Acid, Venous: 1.5 mmol/L (ref 0.5–1.9)
Lactic Acid, Venous: 1.6 mmol/L (ref 0.5–1.9)

## 2021-07-15 LAB — PROCALCITONIN: Procalcitonin: 1.68 ng/mL

## 2021-07-15 MED ORDER — SODIUM CHLORIDE 0.9 % IV SOLN
1.0000 g | INTRAVENOUS | Status: DC
  Administered 2021-07-15: 1 g via INTRAVENOUS
  Filled 2021-07-15 (×2): qty 1

## 2021-07-15 MED ORDER — SODIUM CHLORIDE 0.9 % IV SOLN
6.0000 mg/kg | INTRAVENOUS | Status: DC
  Filled 2021-07-15: qty 10

## 2021-07-15 MED ORDER — SODIUM CHLORIDE 0.9 % IV SOLN
1.0000 g | INTRAVENOUS | Status: DC
  Administered 2021-07-15: 1 g via INTRAVENOUS
  Filled 2021-07-15: qty 10

## 2021-07-15 MED ORDER — ALBUMIN HUMAN 25 % IV SOLN: 25.0000 g | Freq: Once | INTRAVENOUS | Status: AC

## 2021-07-15 MED ORDER — ALBUMIN HUMAN 25 % IV SOLN
INTRAVENOUS | Status: AC
  Administered 2021-07-15: 25 g via INTRAVENOUS
  Filled 2021-07-15: qty 100

## 2021-07-15 MED ORDER — DAPTOMYCIN 500 MG IV SOLR
6.0000 mg/kg | INTRAVENOUS | Status: DC
  Administered 2021-07-15: 500 mg via INTRAVENOUS
  Filled 2021-07-15: qty 10

## 2021-07-15 NOTE — Progress Notes (Signed)
OT Cancellation Note  Patient Details Name: [name] MRN: [id number] DOB: 07-May-1966   Cancelled Treatment:    Reason Eval/Treat Not Completed: Patient declined, no reason specified-- pt declined OT today (pain and low BP), Will follow and see as able.   Jolaine Artist, OT Acute Rehabilitation Services Pager [phone] Office [phone]   Delight Stare 07/15/2021, 10:38 AM

## 2021-07-15 NOTE — Progress Notes (Signed)
   07/15/21 0300  Assess: MEWS Score  Temp 100.3 F (37.9 C)  BP (!) 87/67  Pulse Rate (!) 115  ECG Heart Rate (!) 115  Resp 20  SpO2 90 %  Assess: MEWS Score  MEWS Temp 0  MEWS Systolic 1  MEWS Pulse 2  MEWS RR 0  MEWS LOC 0  MEWS Score 3  MEWS Score Color Yellow  Assess: if the MEWS score is Yellow or Red  Were vital signs taken at a resting state? Yes  Focused Assessment No change from prior assessment  Early Detection of Sepsis Score *See Row Information* Medium  MEWS guidelines implemented *See Row Information* No, previously yellow, continue vital signs every 4 hours  Rectal temperature after tylenol was given to patient

## 2021-07-15 NOTE — Progress Notes (Signed)
   07/15/21 0500  Assess: MEWS Score  Temp 98.3 F (36.8 C)  BP 94/67  Pulse Rate (!) 113  ECG Heart Rate (!) 113  Resp 19  SpO2 92 %  O2 Device Nasal Cannula  Patient Activity (if Appropriate) In bed  O2 Flow Rate (L/min) 2 L/min  Assess: MEWS Score  MEWS Temp 0  MEWS Systolic 1  MEWS Pulse 2  MEWS RR 0  MEWS LOC 0  MEWS Score 3  MEWS Score Color Yellow  Assess: if the MEWS score is Yellow or Red  Were vital signs taken at a resting state? Yes  Focused Assessment No change from prior assessment  Early Detection of Sepsis Score *See Row Information* Medium  MEWS guidelines implemented *See Row Information* No, previously yellow, continue vital signs every 4 hours  Treat  Pain Scale 0-10  Pain Score 4  Pain Type Chronic pain  Pain Location Leg  Pain Orientation Left  Document  Patient Outcome Not stable and remains on department;Other (Comment) (Continue to monitor VS)

## 2021-07-15 NOTE — Sepsis Progress Note (Signed)
Code Sepsis initiated @ 7262 AM, [place] following.

## 2021-07-15 NOTE — Progress Notes (Signed)
Pharmacy Antibiotic Note  [name] is a 55 y.o. female admitted on 06/21/2021 with cellulitis.  Pharmacy has been consulted for daptomycin dosing.  Plan: Daptomycin 500 mg IV q48 hours F/u cultures and clinical course  Height: _0  (165.1 cm) Weight: 133 kg (293 lb 3.4 oz) IBW/kg (Calculated) : 57  Temp (24hrs), Avg:98.7 F (37.1 C), Min:97.5 F (36.4 C), Max:100.3 F (37.9 C)  Recent Labs  Lab 07/10/21 0043 07/10/21 1316 07/11/21 0108 07/12/21 0051 07/13/21 0036 07/14/21 0046 07/15/21 0039  WBC 9.9 10.1 9.4 9.9  --   --  8.9  CREATININE 5.79* 3.72* 4.45* 5.69* 6.47* 5.46* 6.67*    Estimated Creatinine Clearance: 13.3 mL/min (A) (by C-G formula based on SCr of 6.67 mg/dL (H)).    Allergies  Allergen Reactions   Contrast Media [Iodinated Diagnostic Agents] Anaphylaxis, Hives, Swelling and Other (See Comments)    Dye for cardiac cath. Tongue swells   Pneumococcal Vaccines Swelling and Other (See Comments)    Turns skin black, and bodily swelling   Vancomycin Nausea And Vomiting and Other (See Comments)    Infusion "made me feel like I was dying" had to be readmitted to hospital   Thank you for allowing pharmacy to be a part of this patient's care.  Excell Seltzer Poteet 07/15/2021 4:03 AM

## 2021-07-15 NOTE — Progress Notes (Signed)
[facility] Progress Note   Outpatient orders: [facility] , Monday Wednesday Friday, 4hr 15 minutes.  350 BFR/500 DFR  1K, 2.5 calcium, 138 sodium, EDW 135.5 kg, 15-gauge needles, machine temp 37  Assessment/ Plan:   1) ESRD  - will plan for routine HD Wed; she tolerated Monday via AVF with net 3971 ml.   eventually need to work toward doing HD up in chair. I'm not convinced she will be able to do that at this time; main issue is transfer from bed to chair.   - BP was low this am which is why she was not done 1st shift; i've notified the unit that her BP is better. Will try to get her done today.   2) Acute on chronic RV failure with shock, pulmonary HTN -CTA r/o'd PE -TTE with normal EF, mod LVH and severe RV dysfunction -RHC with elevated RA pressure (22), no marked change in pressures or CO with AVF occlusion -Her AVF does not appear to be very high flow.  Will  not ligate it. -on midodrine 15 TID -sildenafil stopped in light of ongoing hypotension UF as able with HD-  using low temp- albumin and profile to get volume off-  stopped using albumin as they cannnot give as OP but on max midodrine. Tolerated HD Monday with net UF of 4.47 L -> next HD today hopefully (BP low this am).   3) Volume/ hypertension: prior EDW 135.5kg. UF with HD.  Over EDW still but making progress, achieved 2.5 liters with last HD-  will continue to push-  lowest  weight recorded at 130-131 kg but she still has a lot of volume onboard   4) Anemia of Chronic Kidney Disease: Hb in 10-11s without ESA here to date. Now dipped under 10-  have added ESA   5) Secondary Hyperparathyroidism/Hyperphosphatemia: sensipar 60 qtx -  phos OK on phoslo -   6) DM2 with hyperglycemia: mgmt per primary service  Subjective:   Felt very tired this am but feels better now. Denies f/cn/v/cp. Tolerated HD yest; able to sit on side of bed with assistance Open wound in left inner thigh   Objective:   BP  103/69   Pulse 73   Temp 99.8 F (37.7 C) (Rectal)   Resp 14   Ht _0  (1.651 m)   Wt 135 kg   SpO2 95%   BMI 49.53 kg/m   Intake/Output Summary (Last 24 hours) at 07/15/2021 1207 Last data filed at 07/15/2021 0900 Gross per 24 hour  Intake 597 ml  Output --  Net 597 ml   Weight change: 1 kg  Physical Exam: Gen:nad, sitting up in bed CVS:s1s2, rrr Resp: normal wob VEH:MCNOB, soft, nt SJG:GEZMOQHUTM bilateral LE's w/ chronic skin changes, wound left inner thigh Neuro: awake, alert, moves all ext spontaneously, speech clear and coherent HD access: lt Cimino +b/t  Imaging: No results found.  Labs: BMET Recent Labs  Lab 07/09/21 0035 07/10/21 0043 07/10/21 1316 07/11/21 0108 07/12/21 0051 07/13/21 0036 07/14/21 0046 07/15/21 0039  NA 135 135 133* 132* 133* 131* 135 129*  K 4.1 4.5 4.0 4.3 5.1 5.6* 4.6 4.8  CL 98 95* 97* 97* 94* 95* 96* 94*  CO2 _1 GLUCOSE 143* 149* 144* 131* 104* 112* 107* 113*  BUN 24* 33* 17 24* 31* 43* 33* 39*  CREATININE 4.64* 5.79* 3.72* 4.45* 5.69* 6.47* 5.46* 6.67*  CALCIUM 8.7* 8.7* 8.8* 9.1 9.1 8.9  9.0 9.0  PHOS 3.6 4.1 3.1 3.9 4.7* 5.1* 4.3  --    CBC Recent Labs  Lab 07/10/21 1316 07/11/21 0108 07/12/21 0051 07/15/21 0039  WBC 10.1 9.4 9.9 8.9  HGB 9.7* 10.1* 10.4* 10.6*  HCT 31.0* 32.4* 33.0* 35.0*  MCV 98.7 99.4 99.4 100.6*  PLT 178 182 189 187    Medications:     amiodarone  200 mg Oral Daily   apixaban  5 mg Oral BID   calcium acetate  667 mg Oral TID WC   Chlorhexidine Gluconate Cloth  6 each Topical Daily   Chlorhexidine Gluconate Cloth  6 each Topical Q0600   darbepoetin (ARANESP) injection - DIALYSIS  60 mcg Intravenous Q Mon-HD   DULoxetine  20 mg Oral Daily   feeding supplement (NEPRO CARB STEADY)  237 mL Oral BID BM   insulin aspart  0-15 Units Subcutaneous TID WC   insulin aspart  0-5 Units Subcutaneous QHS   mouth rinse  15 mL Mouth Rinse BID   midodrine  15 mg Oral TID WC    multivitamin  1 tablet Oral QHS   polyethylene glycol  17 g Oral Daily   senna  1 tablet Oral Daily   sodium chloride flush  3 mL Intravenous Q12H      Otelia Santee, MD 07/15/2021, 12:07 PM

## 2021-07-15 NOTE — Progress Notes (Signed)
   07/15/21 0757  Assess: MEWS Score  Pulse Rate (!) 114  ECG Heart Rate (!) 115  Resp 18  SpO2 95 %  Assess: MEWS Score  MEWS Temp 0  MEWS Systolic 1  MEWS Pulse 2  MEWS RR 0  MEWS LOC 0  MEWS Score 3  MEWS Score Color Yellow  Notify: Provider  Provider Name/Title Amin  Date Provider Notified 07/15/21  Time Provider Notified [phone]  Notification Type Page  Notification Reason Other (Comment) (Wound culture results, notified MD to review)  Provider response Other (Comment) (awt orders)  Date of Provider Response 07/15/21  Time of Provider Response [phone]

## 2021-07-15 NOTE — Progress Notes (Signed)
07/15/21 0001 07/15/21 0039  Assess: MEWS Score  Temp (!) 97.5 F (36.4 C)  --   BP 98/66 95/75  Pulse Rate (!) 110 (!) 113  ECG Heart Rate (!) 110 (!) 113  Resp 20 19  SpO2 92 %  --   Assess: MEWS Score  MEWS Temp 0 0  MEWS Systolic 1 1  MEWS Pulse 1 2  MEWS RR 0 0  MEWS LOC 0 0  MEWS Score 2 3  MEWS Score Color Yellow Yellow  Assess: if the MEWS score is Yellow or Red  Were vital signs taken at a resting state? Yes  --   Focused Assessment Change from prior assessment (see assessment flowsheet)  --   Early Detection of Sepsis Score *See Row Information* Low  --   Notify: Charge Nurse/RN  Name of Charge Nurse/RN Notified Enid Derry  --   Date Charge Nurse/RN Notified 07/15/21  --

## 2021-07-15 NOTE — Progress Notes (Signed)
   07/15/21 0100  Assess: MEWS Score  Temp 98.8 F (37.1 C)  BP 105/77  Pulse Rate (!) 112  ECG Heart Rate (!) 113  Resp 19  SpO2 95 %  Assess: MEWS Score  MEWS Temp 0  MEWS Systolic 0  MEWS Pulse 2  MEWS RR 0  MEWS LOC 0  MEWS Score 2  MEWS Score Color Yellow

## 2021-07-15 NOTE — Significant Event (Addendum)
Rapid Response Event Note   Reason for Call :  Tachycardia, 110s  Initial Focused Assessment:  Pt lying in bed in no distress. Pt alert and oriented, denies chest pain/SOB. Pt does c/o B leg pain. Lungs clear and diminished bases. Skin cool/clammy.   T-100(Ax), HR-113, BP-88/64, RR-15, SpO2-94% on CPAP with 4L bled in.    Interventions:  EKG-ST c 1degree HB Tylenol for fever Plan of Care:  Tx fever and reassess VS. Continue to monitor closely. Call RRT if further assistance needed.    Event Summary:   MD Notified: [place] notified PTA RRT Call [place]  Dillard Essex, RN  Update: 0345-Pt still tachycardia. Pt has foul smelling, painful leg wounds. I think septic workup would be benefical. Bedside RN paged MD regarding pt status.  Septic work-up ordered.Marland Kitchen

## 2021-07-15 NOTE — Sepsis Progress Note (Signed)
Code Sepsis Follow Up Note:  Pt had BC drawn and started on ABX. LA 1.5, and no IVF since patient is on HD. Already on a Progressive unit for monitoring.   Riki Berninger DNP Warren Lacy RN [phone] AM

## 2021-07-15 NOTE — Consult Note (Signed)
WOC Nurse Consult Note: Reason for Consult: Full thickness wound to medial and posterior upper thigh. Wound type: Infectious, pressure plus moisture and friction Pressure Injury POA: Yes  Measurement: 5cm x 11cm with 40% of wound depth obscured by the presence of nonviable tissue (slough), 60% red friable wound bed. Wound bed: As described above Drainage (amount, consistency, odor) small serous Periwound: macerated Dressing procedure/placement/frequency: Patient is on a mattress replacement with low air loss feature for bariatric patients which will assist with microclimate mitigation. She is able to turn from side to side with assistance. She reports that the edema/swelling "became so much, there was no space between the legs."  Guidance for the topical care of these lesions is provided today:  cleansing twice daily with NS, patting dry. Covering with folded pieces of antimicrobial nonadherent gauze (xeroform), topped with dry gauze, and ABD and secured with tape. Changes PRN are provided for dressing dislodgement. If worsening, consider consulting with CCS for input and oversight to the POC.   Also noted is MASD, specifically intertriginous dermatitis to the subpannicular and inguinal skin folds. She reports frequent, albeit not at this time, experience with inframammary ITD.  Phil Dopp, our house antimicrobial wicking textile is provided today for her use. ICD-10 CM Codes for Irritant Dermatitis L30.4  - Erythema intertrigo. Also used for abrasion of the hand, chafing of the skin, dermatitis due to sweating and friction, friction dermatitis, friction eczema, and genital/thigh intertrigo.   Other pressure injury prevention interventions added today are bilateral pressure redistribution heel boots and a sacral prophylactic foam dressing to the sacrum.   [place] nursing team will not follow, but will remain available to this patient, the nursing and medical teams.  Please re-consult if  needed. Thanks, Maudie Flakes, MSN, RN, [place], Arther Abbott  Pager# [phone]

## 2021-07-15 NOTE — Progress Notes (Signed)
   07/15/21 0100 07/15/21 0117 07/15/21 0139  Assess: MEWS Score  Pulse Rate (!) 112 (!) 114 (!) 117  ECG Heart Rate (!) 113 (!) 115 (!) 117  Resp  --  20  --   SpO2  --  92 %  --   Assess: MEWS Score  MEWS Temp 0 0 0  MEWS Systolic 0 1 1  MEWS Pulse _0 MEWS RR 0 0 0  MEWS LOC 0 0 0  MEWS Score _1 MEWS Score Color Yellow Yellow Yellow  Notify: Rapid Response  Name of Rapid Response RN Notified  --  Mindy  --   Date Rapid Response Notified  --  07/15/21  --   Time Rapid Response Notified  --  0117  --   Rapid response notified, asked to come and see Pt.

## 2021-07-15 NOTE — Plan of Care (Signed)

## 2021-07-15 NOTE — Progress Notes (Signed)
   07/15/21 0100 07/15/21 0139  Assess: MEWS Score  Temp 98.8 F (37.1 C) 100 F (37.8 C)  Pulse Rate (!) 112 (!) 117  ECG Heart Rate (!) 113 (!) 117  Level of Consciousness  --  Alert  SpO2  --  96 %  O2 Device  --  Bi-PAP  Assess: MEWS Score  MEWS Temp 0 0  MEWS Systolic 0 1  MEWS Pulse 2 2  MEWS RR 0 0  MEWS LOC 0 0  MEWS Score 2 3  MEWS Score Color Yellow Yellow  Assess: if the MEWS score is Yellow or Red  Were vital signs taken at a resting state?  --  Yes  Focused Assessment  --  No change from prior assessment  Early Detection of Sepsis Score *See Row Information*  --  Medium  MEWS guidelines implemented *See Row Information*  --  No, previously yellow, continue vital signs every 4 hours  Treat  Pain Scale  --  0-10  Pain Score  --  0  Complains of  --  Fever  Interventions  --  Medication (see MAR)

## 2021-07-15 NOTE — Progress Notes (Signed)
   07/15/21 0039  Assess: MEWS Score  BP 95/75  Pulse Rate (!) 113  ECG Heart Rate (!) 113  Resp 19  SpO2 93 %  Assess: MEWS Score  MEWS Temp 0  MEWS Systolic 1  MEWS Pulse 2  MEWS RR 0  MEWS LOC 0  MEWS Score 3  MEWS Score Color Yellow  Notify: Provider  Provider response See new orders  Date of Provider Response 07/15/21  Time of Provider Response [phone]

## 2021-07-15 NOTE — Progress Notes (Signed)
   07/15/21 0339  Assess: MEWS Score  Pulse Rate (!) 113  ECG Heart Rate (!) 113  Resp 20  SpO2 95 %  Assess: MEWS Score  MEWS Temp 0  MEWS Systolic 1  MEWS Pulse 2  MEWS RR 0  MEWS LOC 0  MEWS Score 3  MEWS Score Color Yellow  Notify: Provider  Provider Burton Apley, MD  Date Provider Notified 07/15/21  Time Provider Notified 0330  Notification Type Page  Notification Reason Other (Comment)  Provider response See new orders  Date of Provider Response 07/15/21  Time of Provider Response [phone]  Code Sepsis called on Pt - ordered to be placed on Pt.  Pt has wound on left leg, received verbal order to culture wounds.

## 2021-07-15 NOTE — Progress Notes (Signed)
PROGRESS NOTE    [name]  JOI:[id number] DOB: July 12, 1966 DOA: 06/21/2021 PCP: Noreene Larsson, NP   Brief Narrative:  55 year old female with a history of end-stage renal disease on hemodialysis, has had a prolonged hospitalization since she was admitted on 06/21/2021 for cellulitis and concerns for sepsis.  She was treated with antibiotics, but had continued issues with hypotension.  Upon further evaluation, it was felt that her hypotension was more related to cardiogenic shock from RV failure and pulmonary hypertension group 3.  She is being followed by cardiology, nephrology and PCCM.  She is intermittently required vasopressors for hypotension.  She has been off vasopressors since 7/20 and has tolerated hemodialysis.  Currently on midodrine.  Transferred to [facility] service on 7/23.  Overnight on 7/26 patient became tachycardic and hypotensive concerns for left lower extremity cellulitis/sepsis started on IV daptomycin and cefepime.     Assessment & Plan:   Principal Problem:   Hypotension Active Problems:   Lymphedema of lower extremity   Morbid obesity (HCC)   Obesity hypoventilation syndrome (HCC)   Cellulitis   ESRD on dialysis (HCC)   Elevated troponin   Type 2 diabetes mellitus with hyperglycemia (HCC)   Cardiogenic shock (HCC)   Bilateral calf pain   Pressure injury of skin   Sepsis secondary to left lower extremity cellulitis with purulent discharge, 5 cm X 11 cm full-thickness wound in medial and posterior upper thigh - Sepsis protocol initiated.  Follow-up culture data.  Wound cultures growing gram-positive cocci and gram-negative rods - On IV daptomycin and IV cefepime - Check CK levels - Procalcitonin 1.68 -Gentle bolus if necessary - Seen by wound care - Order CT of lower extremity to rule out any deeper infection   Cardiogenic shock due to RV failure Pulmonary hypertension Acute on chronic diastolic congestive heart failure -Currently on midodrine 73m tid.   Off vasopressors since 7/20 tolerating HD sessions - Has large AV fistula but occluded therefore no ligation necessary - CHF team - Signed off - Sildenafil stopped   Intermittent atypical atrial flutter, now in sinus rhythm -Status post cardioversion 7/15.  On amiodarone and Eliquis   End-stage renal disease -Nephrology following.  HD per their discretion - Needs to sit up in chair   Suspected OSA -Bedtime CPAP.  Will need outpatient sleep study   Fibromyalgia -Continue Cymbalta   Cellulitis of left lower extremity -Completed course of daptomycin   Anemia of chronic kidney disease -No evidence of bleeding.  Hemoglobin stable.  Continue Aranesp   DM2 with hyperglycemia -A1c 6.9.  Sliding scale and Accu-Chek   Generalized weakness -PT recommends SNF placement   Goals of care -Due to multiple medical comorbidities and acute ongoing issues.  Palliative care team was consulted.  Patient wishes to be full code with full scope of treatment, hopeful for improvement and going to rehab.    DVT prophylaxis: Eliquis Code Status: Full code Family Communication:   Status is: Inpatient  Remains inpatient appropriate because:Inpatient level of care appropriate due to severity of illness  Dispo: The patient is from: Home              Anticipated d/c is to: SNF              Patient currently is not medically stable to d/c.  Awaiting clearance by nephrology and arrangements for outpatient HD   Difficult to place patient No       Nutritional status  Nutrition Problem: Increased nutrient needs Etiology:  wound healing  Signs/Symptoms: estimated needs  Interventions: Nepro shake  Body mass index is 49.53 kg/m.  Pressure Injury 07/04/21 Thigh Posterior;Proximal;Right Stage 2 -  Partial thickness loss of dermis presenting as a shallow open injury with a red, pink wound bed without slough. (Active)  07/04/21 1200  Location: Thigh  Location Orientation:  Posterior;Proximal;Right  Staging: Stage 2 -  Partial thickness loss of dermis presenting as a shallow open injury with a red, pink wound bed without slough.  Wound Description (Comments):   Present on Admission: No     Subjective: Overnight patient became hypotensive with tachycardia.  Also some drainage from her left lower extremity concerns for cellulitis started on IV daptomycin.  During my visit patient did report of left lower extremity medial thigh discomfort  Review of Systems Otherwise negative except as per HPI, including: General: Denies  night sweats or unintended weight loss. Resp: Denies cough, wheezing, shortness of breath. Cardiac: Denies chest pain, palpitations, orthopnea, paroxysmal nocturnal dyspnea. GI: Denies abdominal pain, nausea, vomiting, diarrhea or constipation GU: Denies dysuria, frequency, hesitancy or incontinence MS: Denies muscle aches, joint pain or swelling Neuro: Denies headache, neurologic deficits (focal weakness, numbness, tingling), abnormal gait Psych: Denies anxiety, depression, SI/HI/AVH Skin: Medial thigh wound ID: Denies sick contacts, exotic exposures, travel  Examination: Constitutional: Not in acute distress, 3 L nasal cannula Respiratory: Clear to auscultation bilaterally Cardiovascular: Normal sinus rhythm, no rubs Abdomen: Nontender nondistended good bowel sounds Musculoskeletal: No edema noted Skin: Bilateral medial thigh wound greater than left and right.  Tender to palpation.  Wound dressing in place Neurologic: CN 2-12 grossly intact.  And nonfocal Psychiatric: Normal judgment and insight. Alert and oriented x 3. Normal mood.  Left upper extremity AV fistula  Objective: Vitals:   07/15/21 0423 07/15/21 0500 07/15/21 0600 07/15/21 0716  BP: 1_0 92/63  Pulse: (!) 111 (!) 113 (!) 114 (!) 114  Resp: _1 Temp: 98.7 F (37.1 C) 98.3 F (36.8 C) 98 F (36.7 C) 98.2 F (36.8 C)  TempSrc: Oral Oral  Oral Oral  SpO2: 93% 92% 93% 97%  Weight: 135 kg     Height:        Intake/Output Summary (Last 24 hours) at 07/15/2021 0747 Last data filed at 07/15/2021 0447 Gross per 24 hour  Intake 834 ml  Output --  Net 834 ml   Filed Weights   07/13/21 1339 07/14/21 0409 07/15/21 0423  Weight: 134 kg 133 kg 135 kg     Data Reviewed:   CBC: Recent Labs  Lab 07/10/21 0043 07/10/21 1316 07/11/21 0108 07/12/21 0051 07/15/21 0039  WBC 9.9 10.1 9.4 9.9 8.9  HGB 9.8* 9.7* 10.1* 10.4* 10.6*  HCT 30.9* 31.0* 32.4* 33.0* 35.0*  MCV 99.0 98.7 99.4 99.4 100.6*  PLT 194 178 182 189 628   Basic Metabolic Panel: Recent Labs  Lab 07/09/21 0616 07/10/21 0043 07/10/21 1316 07/11/21 0108 07/12/21 0051 07/13/21 0036 07/14/21 0046 07/15/21 0039  NA  --    < > 133* 132* 133* 131* 135 129*  K  --    < > 4.0 4.3 5.1 5.6* 4.6 4.8  CL  --    < > 97* 97* 94* 95* 96* 94*  CO2  --    < > _2 GLUCOSE  --    < > 144* 131* 104* 112* 107* 113*  BUN  --    < > 17 24* 31* 43*  33* 39*  CREATININE  --    < > 3.72* 4.45* 5.69* 6.47* 5.46* 6.67*  CALCIUM  --    < > 8.8* 9.1 9.1 8.9 9.0 9.0  MG 2.1  --   --   --   --   --   --  2.4  PHOS  --    < > 3.1 3.9 4.7* 5.1* 4.3  --    < > = values in this interval not displayed.   GFR: Estimated Creatinine Clearance: 13.4 mL/min (A) (by C-G formula based on SCr of 6.67 mg/dL (H)). Liver Function Tests: Recent Labs  Lab 07/10/21 1316 07/11/21 0108 07/12/21 0051 07/13/21 0036 07/14/21 0046  ALBUMIN 3.2* 3.1* 3.0* 2.9* 2.9*   No results for input(s): LIPASE, AMYLASE in the last 168 hours. No results for input(s): AMMONIA in the last 168 hours. Coagulation Profile: Recent Labs  Lab 07/15/21 0420  INR 1.9*   Cardiac Enzymes: No results for input(s): CKTOTAL, CKMB, CKMBINDEX, TROPONINI in the last 168 hours. BNP (last 3 results) No results for input(s): PROBNP in the last 8760 hours. HbA1C: No results for input(s): HGBA1C in the last  72 hours. CBG: Recent Labs  Lab 07/14/21 0600 07/14/21 1111 07/14/21 1649 07/14/21 2111 07/15/21 0645  GLUCAP 110* 185* 123* 124* 109*   Lipid Profile: No results for input(s): CHOL, HDL, LDLCALC, TRIG, CHOLHDL, LDLDIRECT in the last 72 hours. Thyroid Function Tests: No results for input(s): TSH, T4TOTAL, FREET4, T3FREE, THYROIDAB in the last 72 hours. Anemia Panel: No results for input(s): VITAMINB12, FOLATE, FERRITIN, TIBC, IRON, RETICCTPCT in the last 72 hours. Sepsis Labs: Recent Labs  Lab 07/15/21 0420 07/15/21 0615  PROCALCITON 1.68  --   LATICACIDVEN 1.5 1.6    Recent Results (from the past 240 hour(s))  Aerobic Culture w Gram Stain (superficial specimen)     Status: None (Preliminary result)   Collection Time: 07/15/21  4:30 AM   Specimen: Wound  Result Value Ref Range Status   Specimen Description WOUND  Final   Special Requests POSTERIOR LEFT LEG  Final   Gram Stain   Final    NO WBC SEEN ABUNDANT GRAM POSITIVE COCCI ABUNDANT GRAM NEGATIVE RODS ABUNDANT GRAM POSITIVE RODS Performed at [facility], [address], [place], [place] [address]    Culture PENDING  Incomplete   Report Status PENDING  Incomplete  Aerobic Culture w Gram Stain (superficial specimen)     Status: None (Preliminary result)   Collection Time: 07/15/21  4:30 AM   Specimen: Wound  Result Value Ref Range Status   Specimen Description WOUND  Final   Special Requests LEFT FRONT LEG  Final   Gram Stain   Final    NO WBC SEEN ABUNDANT GRAM POSITIVE COCCI FEW GRAM POSITIVE RODS FEW GRAM NEGATIVE RODS Performed at [facility], [place] [address] [place], [place] [address]    Culture PENDING  Incomplete   Report Status PENDING  Incomplete         Radiology Studies: No results found.      Scheduled Meds:  amiodarone  200 mg Oral Daily   apixaban  5 mg Oral BID   calcium acetate  667 mg Oral TID WC   Chlorhexidine Gluconate Cloth  6 each Topical Daily    Chlorhexidine Gluconate Cloth  6 each Topical Q0600   darbepoetin (ARANESP) injection - DIALYSIS  60 mcg Intravenous Q Mon-HD   DULoxetine  20 mg Oral Daily  feeding supplement (NEPRO CARB STEADY)  237 mL Oral BID BM   insulin aspart  0-15 Units Subcutaneous TID WC   insulin aspart  0-5 Units Subcutaneous QHS   mouth rinse  15 mL Mouth Rinse BID   midodrine  15 mg Oral TID WC   multivitamin  1 tablet Oral QHS   polyethylene glycol  17 g Oral Daily   senna  1 tablet Oral Daily   sodium chloride flush  3 mL Intravenous Q12H   Continuous Infusions:  sodium chloride     sodium chloride     DAPTOmycin (CUBICIN)  IV 500 mg (07/15/21 0447)   promethazine (PHENERGAN) injection (IM or IVPB) Stopped (07/02/21 1831)     LOS: 24 days   Time spent= 35 mins    Blanton Kardell Arsenio Loader, MD [facility]  If 7PM-7AM, please contact night-coverage  07/15/2021, 7:47 AM

## 2021-07-15 NOTE — Progress Notes (Signed)
Pt has increase of HR , in reviewing monitor, started around 2130 on 07/14/2021. Paged MD of situation.  Pt is now a yellow [place].

## 2021-07-15 NOTE — H&P (Signed)
Overnight progress note  Informed by RN about patient's vital signs: Heart rate in the 110s, sinus rhythm.  Temperature 100.3 F.  Blood pressure 92/72.  SPO2 94% on 2 L supplemental oxygen.  RN reporting worsening left lower extremity cellulitis/open wounds on the leg with very foul-smelling drainage.  WBC 8.9 on labs this morning.   -Sepsis protocol initiated -Vancomycin allergy listed.  Daptomycin started after discussion with pharmacy. -Avoiding fluid boluses given decompensated heart failure -Hypotension has been an ongoing issue.  Continue midodrine. -Blood cultures prior to initiation of antibiotic -Wound cultures -Lactate -Procalcitonin -PT/INR

## 2021-07-15 NOTE — Progress Notes (Signed)
PT Cancellation Note  Patient Details Name: [name] MRN: [id number] DOB: Jan 16, 1966   Cancelled Treatment:    Reason Eval/Treat Not Completed: Patient declined, no reason specified.  Pt felt poorly with "soft" BP's and declined today.  Will see 7/28 as able. 07/15/2021  Ginger Carne., PT Acute Rehabilitation Services [phone]  (pager) [phone]  (office)   [name] 07/15/2021, 2:46 PM

## 2021-07-15 NOTE — TOC Progression Note (Signed)
Transition of Care (TOC) - Progression Note  Heart Failure   Patient Details  Name: [name] MRN: [id number] Date of Birth: Sep 28, 1966  Transition of Care [facility]) CM/SW [place], [place] Phone Number: 07/15/2021, 9:46 AM  Clinical Narrative:    Insurance authorization approved for SNF from 07/17/2021 - 07/21/2021 authorization ID#: [id number]. Pending patient's medical readiness for discharge.   Expected Discharge Plan: Skilled Nursing Facility Barriers to Discharge: Continued Medical Work up, Ship broker  Expected Discharge Plan and Services Expected Discharge Plan: [place] In-house Referral: Clinical Social Work   Post Acute Care Choice: [place] Living arrangements for the past 2 months: Single Family Home                                       Social Determinants of Health (SDOH) Interventions Food Insecurity Interventions: Intervention Not Indicated Financial Strain Interventions: Intervention Not Indicated Housing Interventions: Intervention Not Indicated Transportation Interventions: Intervention Not Indicated  Readmission Risk Interventions Readmission Risk Prevention Plan 04/30/2019  Transportation Screening Complete  Medication Review Press photographer) Complete  PCP or Specialist appointment within 3-5 days of discharge Complete  HRI or [place] Complete  SW Recovery Care/Counseling Consult Complete  [place] Not Applicable  Some recent data might be hidden

## 2021-07-16 DIAGNOSIS — R7881 Bacteremia: Secondary | ICD-10-CM | POA: Diagnosis not present

## 2021-07-16 DIAGNOSIS — R570 Cardiogenic shock: Secondary | ICD-10-CM | POA: Diagnosis not present

## 2021-07-16 DIAGNOSIS — I890 Lymphedema, not elsewhere classified: Secondary | ICD-10-CM | POA: Diagnosis not present

## 2021-07-16 DIAGNOSIS — B957 Other staphylococcus as the cause of diseases classified elsewhere: Secondary | ICD-10-CM

## 2021-07-16 DIAGNOSIS — L03115 Cellulitis of right lower limb: Secondary | ICD-10-CM | POA: Diagnosis not present

## 2021-07-16 DIAGNOSIS — I272 Pulmonary hypertension, unspecified: Secondary | ICD-10-CM

## 2021-07-16 DIAGNOSIS — I50812 Chronic right heart failure: Secondary | ICD-10-CM | POA: Diagnosis not present

## 2021-07-16 DIAGNOSIS — I9589 Other hypotension: Secondary | ICD-10-CM | POA: Diagnosis not present

## 2021-07-16 DIAGNOSIS — M79661 Pain in right lower leg: Secondary | ICD-10-CM | POA: Diagnosis not present

## 2021-07-16 LAB — GLUCOSE, CAPILLARY
Glucose-Capillary: 109 mg/dL — ABNORMAL HIGH (ref 70–99)
Glucose-Capillary: 146 mg/dL — ABNORMAL HIGH (ref 70–99)
Glucose-Capillary: 158 mg/dL — ABNORMAL HIGH (ref 70–99)
Glucose-Capillary: 126 mg/dL — ABNORMAL HIGH (ref 70–99)

## 2021-07-16 LAB — BLOOD CULTURE ID PANEL (REFLEXED) - BCID2

## 2021-07-16 LAB — BASIC METABOLIC PANEL
Anion gap: 11 (ref 5–15)
BUN: 30 mg/dL — ABNORMAL HIGH (ref 6–20)
CO2: 27 mmol/L (ref 22–32)
Calcium: 9.3 mg/dL (ref 8.9–10.3)
Chloride: 95 mmol/L — ABNORMAL LOW (ref 98–111)
Creatinine, Ser: 5.38 mg/dL — ABNORMAL HIGH (ref 0.44–1.00)
GFR, Estimated: 9 mL/min — ABNORMAL LOW (ref >60)
Glucose, Bld: 121 mg/dL — ABNORMAL HIGH (ref 70–99)
Potassium: 4.8 mmol/L (ref 3.5–5.1)
Sodium: 133 mmol/L — ABNORMAL LOW (ref 135–145)

## 2021-07-16 LAB — CBC
HCT: 31.2 % — ABNORMAL LOW (ref 36.0–46.0)
Hemoglobin: 9.8 g/dL — ABNORMAL LOW (ref 12.0–15.0)
MCH: 31.3 pg (ref 26.0–34.0)
MCHC: 31.4 g/dL (ref 30.0–36.0)
MCV: 99.7 fL (ref 80.0–100.0)
Platelets: 199 10*3/uL (ref 150–400)
RBC: 3.13 MIL/uL — ABNORMAL LOW (ref 3.87–5.11)
RDW: 17.2 % — ABNORMAL HIGH (ref 11.5–15.5)
WBC: 10.3 10*3/uL (ref 4.0–10.5)
nRBC: 0.2 % (ref 0.0–0.2)

## 2021-07-16 LAB — CK: Total CK: 11 U/L — ABNORMAL LOW (ref 38–234)

## 2021-07-16 LAB — MAGNESIUM: Magnesium: 2.3 mg/dL (ref 1.7–2.4)

## 2021-07-16 MED ORDER — LINEZOLID 600 MG PO TABS
600.0000 mg | ORAL_TABLET | Freq: Two times a day (BID) | ORAL | Status: AC
  Administered 2021-07-16 – 2021-07-21 (×12): 600 mg via ORAL
  Filled 2021-07-16 (×12): qty 1

## 2021-07-16 MED ORDER — NEPRO/CARBSTEADY PO LIQD
237.0000 mL | Freq: Three times a day (TID) | ORAL | Status: DC
  Administered 2021-07-16 – 2021-08-18 (×60): 237 mL via ORAL

## 2021-07-16 NOTE — Progress Notes (Signed)
Orthopedic Tech Progress Note Patient Details:  [name] Oct 16, 1966 [id number]  Ortho Devices Type of Ortho Device: Haematologist Ortho Device/Splint Location: BLE Ortho Device/Splint Interventions: Ordered, Application   Post Interventions Patient Tolerated: Well Instructions Provided: [facility] of [place] 07/16/2021, 7:29 PM

## 2021-07-16 NOTE — Progress Notes (Signed)
Occupational Therapy Treatment Patient Details Name: [name] MRN: [id number] DOB: [birth year] Today's Date: 07/16/2021    History of present illness 55 y.o. female presenting from [facility] ED 7/3 with lethargy, hypotension and elevated lactic acid after d/c from [facility] earlier that day with septic shock and cellulitis. Patient found to have AMS, septic shock, acute respiratory failure, acute on chronic RV failure. Transferred to ICU on 7/12 due to hypotension. PMHX significant for obesity, DMII, COPD, CAD, lymphedema, ESRD on HD MWF and CHF.   OT comments  Session focused on initiating Kreg bed tilt in prep for transfer tolerance. Pt able to achieve 49* of tilt for at least 5 min at this level with BP WFL and pain level tolerable. Pt able to demo decent UB strength in pushing self up while tilting to initiate slight LE extension, as well as to scoot up in bed. RN present during session. Recommend pt to tilt with staff 3x/day to maximize benefit of Kreg bed. Coordinated with secretary for ordering of prevalon boots according to order placed on 7/27. Continue to recommend SNF rehab at [place].    Follow Up Recommendations  SNF;Supervision/Assistance - 24 hour    Equipment Recommendations  Other (comment) (defer to SNF)    Recommendations for Other Services      Precautions / Restrictions Precautions Precautions: Fall Precaution Comments: bil LE edema, L LE/groin wounds Restrictions Weight Bearing Restrictions: No       Mobility Bed Mobility Overal bed mobility: Needs Assistance             General bed mobility comments: use trendelenburg feature and bed padding to assist pt's significant efforts with the HOB frame to scoot up toward HOB.    Transfers                 General transfer comment: Use of KREG bed to stand. Able to acheive 49* tilt with tolerable pain with around 5 min of tolerance to peak tilt    Balance                                            ADL either performed or assessed with clinical judgement   ADL Overall ADL's : Needs assistance/impaired                                       General ADL Comments: Session focused on initial Kreg bed tilt trial to facilitate weightbearing through LEs, standing tolerance, and UB strength to assist with tasks.     Vision   Vision Assessment?: No apparent visual deficits   Perception     Praxis      Cognition Arousal/Alertness: Awake/alert Behavior During Therapy: WFL for tasks assessed/performed Overall Cognitive Status: Within Functional Limits for tasks assessed                                 General Comments: very pleasant, anxious with movement but motivated to participate        Exercises Exercises: Other exercises Other Exercises Other Exercises: pushing through B UE on bedrails to facilitate improved weightbearing/weightshifting   Shoulder Instructions       General Comments BP 125/83 (85) at peak tilt. Collaborated with RN who  was present on tilt bed functions and ideal 3x/day tilt. Desats to 88% on O2 with flat bed    Pertinent Vitals/ Pain       Pain Assessment: Faces Faces Pain Scale: Hurts even more Pain Location: LEs Pain Descriptors / Indicators: Grimacing;Guarding;Sore;Burning;Aching Pain Intervention(s): Monitored during session;RN gave pain meds during session;Limited activity within patient's tolerance  Home Living                                          Prior Functioning/Environment              Frequency  Min 2X/week        Progress Toward Goals  OT Goals(current goals can now be found in the care plan section)  Progress towards OT goals: Progressing toward goals  Acute Rehab OT Goals Patient Stated Goal: get stronger and return home OT Goal Formulation: With patient Time For Goal Achievement: 07/30/21 Potential to Achieve Goals: Fair ADL Goals Pt Will Perform Grooming:  sitting;with set-up Pt Will Perform Upper Body Dressing: with set-up;sitting Pt Will Transfer to Toilet: with max assist;with +2 assist;bedside commode Pt Will Perform Toileting - Clothing Manipulation and hygiene: with mod assist;sitting/lateral leans;sit to/from stand;with adaptive equipment Pt/caregiver will Perform Home Exercise Program: Increased strength;Both right and left upper extremity;With theraband;With theraputty;Independently Additional ADL Goal #1: Pt will tolerate 10 minutes sitting EOB dynamically with supervision as precursor to ADLs.  Plan Discharge plan remains appropriate;Frequency remains appropriate    Co-evaluation    PT/OT/SLP Co-Evaluation/Treatment: Yes Reason for Co-Treatment: For patient/therapist safety;To address functional/ADL transfers;Other (comment) (Kreg bed initial tilt)   OT goals addressed during session: Strengthening/ROM;Other (comment) (standing tolerance for ADL transfers)      AM-PAC OT "6 Clicks" Daily Activity     Outcome Measure   Help from another person eating meals?: None Help from another person taking care of personal grooming?: A Little Help from another person toileting, which includes using toliet, bedpan, or urinal?: Total Help from another person bathing (including washing, rinsing, drying)?: A Lot Help from another person to put on and taking off regular upper body clothing?: A Little Help from another person to put on and taking off regular lower body clothing?: Total 6 Click Score: 14    End of Session Equipment Utilized During Treatment: Oxygen  OT Visit Diagnosis: Other abnormalities of gait and mobility (R26.89);Muscle weakness (generalized) (M62.81);Pain Pain - Right/Left: Right Pain - part of body: Leg;Ankle and joints of foot   Activity Tolerance Patient tolerated treatment well;Patient limited by pain   Patient Left in bed;with call bell/phone within reach   Nurse Communication Mobility status;Other (comment)  (tilt bed schedule/functions)        Time: 9728-2060 OT Time Calculation (min): 40 min  Charges: OT General Charges $OT Visit: 1 Visit OT Treatments $Therapeutic Activity: 23-37 mins  Malachy Chamber, OTR/L Acute Rehab Services Office: [phone]    [name] 07/16/2021, 12:18 PM

## 2021-07-16 NOTE — Progress Notes (Signed)
PHARMACY - PHYSICIAN COMMUNICATION CRITICAL VALUE ALERT - BLOOD CULTURE IDENTIFICATION (BCID)  [name] is an 55 y.o. female who presented to [facility] on 06/21/2021 with a chief complaint of cellulitis   Name of physician (or Provider) ContactedReesa Chew  Current antibiotics: Dapto + cefepime  Changes to prescribed antibiotics recommended:  Patient is on recommended antibiotics - No changes needed  Results for orders placed or performed during the hospital encounter of 06/21/21  Blood Culture ID Panel (Reflexed) (Collected: 07/15/2021  3:58 AM)  Result Value Ref Range   Enterococcus faecalis NOT DETECTED NOT DETECTED   Enterococcus Faecium NOT DETECTED NOT DETECTED   Listeria monocytogenes NOT DETECTED NOT DETECTED   Staphylococcus species DETECTED (A) NOT DETECTED   Staphylococcus aureus (BCID) NOT DETECTED NOT DETECTED   Staphylococcus epidermidis DETECTED (A) NOT DETECTED   Staphylococcus lugdunensis NOT DETECTED NOT DETECTED   Streptococcus species NOT DETECTED NOT DETECTED   Streptococcus agalactiae NOT DETECTED NOT DETECTED   Streptococcus pneumoniae NOT DETECTED NOT DETECTED   Streptococcus pyogenes NOT DETECTED NOT DETECTED   A.calcoaceticus-baumannii NOT DETECTED NOT DETECTED   Bacteroides fragilis NOT DETECTED NOT DETECTED   Enterobacterales NOT DETECTED NOT DETECTED   Enterobacter cloacae complex NOT DETECTED NOT DETECTED   Escherichia coli NOT DETECTED NOT DETECTED   Klebsiella aerogenes NOT DETECTED NOT DETECTED   Klebsiella oxytoca NOT DETECTED NOT DETECTED   Klebsiella pneumoniae NOT DETECTED NOT DETECTED   Proteus species NOT DETECTED NOT DETECTED   Salmonella species NOT DETECTED NOT DETECTED   Serratia marcescens NOT DETECTED NOT DETECTED   Haemophilus influenzae NOT DETECTED NOT DETECTED   Neisseria meningitidis NOT DETECTED NOT DETECTED   Pseudomonas aeruginosa NOT DETECTED NOT DETECTED   Stenotrophomonas maltophilia NOT DETECTED NOT DETECTED    Candida albicans NOT DETECTED NOT DETECTED   Candida auris NOT DETECTED NOT DETECTED   Candida glabrata NOT DETECTED NOT DETECTED   Candida krusei NOT DETECTED NOT DETECTED   Candida parapsilosis NOT DETECTED NOT DETECTED   Candida tropicalis NOT DETECTED NOT DETECTED   Cryptococcus neoformans/gattii NOT DETECTED NOT DETECTED   Methicillin resistance mecA/C DETECTED (A) NOT DETECTED    Einar Grad 07/16/2021  7:36 AM

## 2021-07-16 NOTE — Progress Notes (Signed)
PROGRESS NOTE    [name]  YNW:[id number] DOB: 1966-03-24 DOA: 06/21/2021 PCP: Noreene Larsson, NP   Brief Narrative:  55 year old female with a history of end-stage renal disease on hemodialysis, has had a prolonged hospitalization since she was admitted on 06/21/2021 for cellulitis and concerns for sepsis.  She was treated with antibiotics, but had continued issues with hypotension.  Upon further evaluation, it was felt that her hypotension was more related to cardiogenic shock from RV failure and pulmonary hypertension group 3.  She is being followed by cardiology, nephrology and PCCM.  She is intermittently required vasopressors for hypotension.  She has been off vasopressors since 7/20 and has tolerated hemodialysis.  Currently on midodrine.  Transferred to [facility] service on 7/23.  Overnight on 7/26 patient became tachycardic and hypotensive concerns for left lower extremity cellulitis/sepsis started on IV daptomycin and cefepime.  CT of the thigh was ordered, patient was seen by wound care.  Infectious disease team consulted, remove midline and start linezolid twice daily.     Assessment & Plan:   Principal Problem:   Hypotension Active Problems:   Lymphedema of lower extremity   Morbid obesity (HCC)   Obesity hypoventilation syndrome (HCC)   Cellulitis   ESRD on dialysis (HCC)   Elevated troponin   Type 2 diabetes mellitus with hyperglycemia (HCC)   Cardiogenic shock (HCC)   Bilateral calf pain   Pressure injury of skin   Left lower extremity cellulitis with purulent discharge, 5 cm X 11 cm full-thickness wound in medial and posterior upper thigh with B/L Lymphadenopathy - Patient seen by infectious disease, less evidence of bacterial infection therefore antibiotics discontinued - Discontinue right upper extremity midline.  Per RN patient does have other access -Linezolid at least for 5 days after midline removal -Compression stockings for lymphedema - CT lower  extremity-chronic inflammation and lymphedema   Cardiogenic shock due to RV failure Pulmonary hypertension Acute on chronic diastolic congestive heart failure -Currently on midodrine 54m tid.  Off vasopressors since 7/20 tolerating HD sessions - Has large AV fistula but occluded therefore no ligation necessary - CHF team - Signed off - Sildenafil stopped   Intermittent atypical atrial flutter, now in sinus rhythm -Status post cardioversion 7/15.  On amiodarone and Eliquis   End-stage renal disease -Nephrology following.  HD per nephrology discretion - Needs to sit up in chair   Suspected OSA -Bedtime CPAP.  Will need outpatient sleep study   Fibromyalgia -Continue Cymbalta   Anemia of chronic kidney disease -No evidence of bleeding.  Hemoglobin stable.  Continue Aranesp   DM2 with hyperglycemia, range 109-151 in last 24 hours -A1c 6.9.  Sliding scale and Accu-Chek   Generalized weakness -PT recommends SNF placement   Goals of care -Due to multiple medical comorbidities and acute ongoing issues.  Palliative care team was consulted.  Patient wishes to be full code with full scope of treatment, hopeful for improvement and going to rehab.    DVT prophylaxis: Eliquis Code Status: Full code Family Communication: Husband updated.   Status is: Inpatient  Remains inpatient appropriate because:Inpatient level of care appropriate due to severity of illness  Dispo: The patient is from: Home              Anticipated d/c is to: SNF              Patient currently is not medically stable to d/c.  Currently ongoing management for left lower extremity cellulitis causing sepsis.  In the  meantime she needs to be able to tolerate sitting up in the HD chair   Difficult to place patient No   Nutritional status  Nutrition Problem: Increased nutrient needs Etiology: wound healing  Signs/Symptoms: estimated needs  Interventions: Nepro shake  Body mass index is 53.97  kg/m.  Pressure Injury 07/04/21 Thigh Posterior;Proximal;Right Stage 2 -  Partial thickness loss of dermis presenting as a shallow open injury with a red, pink wound bed without slough. (Active)  07/04/21 1200  Location: Thigh  Location Orientation: Posterior;Proximal;Right  Staging: Stage 2 -  Partial thickness loss of dermis presenting as a shallow open injury with a red, pink wound bed without slough.  Wound Description (Comments):   Present on Admission: No     Subjective: Sitting up in her [place], reports of some generalized pain. No acute events overnight.   Examination: Constitutional: Not in acute distress. 3L [place] Respiratory: Clear to auscultation bilaterally Cardiovascular: Normal sinus rhythm, no rubs Abdomen: Nontender nondistended good bowel sounds Musculoskeletal: No edema noted Skin: Wound dressing in place on her medial thigh Neurologic: CN 2-12 grossly intact.  And nonfocal Psychiatric: Normal judgment and insight. Alert and oriented x 3. Normal mood.   Left upper extremity AV fistula  Objective: Vitals:   07/16/21 0204 07/16/21 0329 07/16/21 0333 07/16/21 0341  BP: 109/66 114/64  114/64  Pulse:  76 73 71  Resp: 18 19 (!) 26 17  Temp: 98.5 F (36.9 C) 98.2 F (36.8 C)  98.2 F (36.8 C)  TempSrc: Axillary Oral  Oral  SpO2: 100% 98% 96% 94%  Weight:  (!) 137 kg (!) 147.1 kg   Height:   5' 5" (1.651 m)     Intake/Output Summary (Last 24 hours) at 07/16/2021 0809 Last data filed at 07/15/2021 1845 Gross per 24 hour  Intake 580 ml  Output 2500 ml  Net -1920 ml   Filed Weights   07/15/21 0423 07/16/21 0329 07/16/21 0333  Weight: 135 kg (!) 137 kg (!) 147.1 kg     Data Reviewed:   CBC: Recent Labs  Lab 07/10/21 1316 07/11/21 0108 07/12/21 0051 07/15/21 0039 07/16/21 0015  WBC 10.1 9.4 9.9 8.9 10.3  HGB 9.7* 10.1* 10.4* 10.6* 9.8*  HCT 31.0* 32.4* 33.0* 35.0* 31.2*  MCV 98.7 99.4 99.4 100.6* 99.7  PLT 178 182 189 187 765   Basic  Metabolic Panel: Recent Labs  Lab 07/10/21 1316 07/11/21 0108 07/12/21 0051 07/13/21 0036 07/14/21 0046 07/15/21 0039 07/16/21 0015  NA 133* 132* 133* 131* 135 129* 133*  K 4.0 4.3 5.1 5.6* 4.6 4.8 4.8  CL 97* 97* 94* 95* 96* 94* 95*  CO2 _0 GLUCOSE 144* 131* 104* 112* 107* 113* 121*  BUN 17 24* 31* 43* 33* 39* 30*  CREATININE 3.72* 4.45* 5.69* 6.47* 5.46* 6.67* 5.38*  CALCIUM 8.8* 9.1 9.1 8.9 9.0 9.0 9.3  MG  --   --   --   --   --  2.4 2.3  PHOS 3.1 3.9 4.7* 5.1* 4.3  --   --    GFR: Estimated Creatinine Clearance: 17.6 mL/min (A) (by C-G formula based on SCr of 5.38 mg/dL (H)). Liver Function Tests: Recent Labs  Lab 07/10/21 1316 07/11/21 0108 07/12/21 0051 07/13/21 0036 07/14/21 0046  ALBUMIN 3.2* 3.1* 3.0* 2.9* 2.9*   No results for input(s): LIPASE, AMYLASE in the last 168 hours. No results for input(s): AMMONIA in the last 168 hours. Coagulation Profile: Recent  Labs  Lab 07/15/21 0420  INR 1.9*   Cardiac Enzymes: No results for input(s): CKTOTAL, CKMB, CKMBINDEX, TROPONINI in the last 168 hours. BNP (last 3 results) No results for input(s): PROBNP in the last 8760 hours. HbA1C: No results for input(s): HGBA1C in the last 72 hours. CBG: Recent Labs  Lab 07/14/21 2111 07/15/21 0645 07/15/21 1115 07/15/21 2130 07/16/21 0624  GLUCAP 124* 109* 151* 117* 109*   Lipid Profile: No results for input(s): CHOL, HDL, LDLCALC, TRIG, CHOLHDL, LDLDIRECT in the last 72 hours. Thyroid Function Tests: No results for input(s): TSH, T4TOTAL, FREET4, T3FREE, THYROIDAB in the last 72 hours. Anemia Panel: No results for input(s): VITAMINB12, FOLATE, FERRITIN, TIBC, IRON, RETICCTPCT in the last 72 hours. Sepsis Labs: Recent Labs  Lab 07/15/21 0420 07/15/21 0615  PROCALCITON 1.68  --   LATICACIDVEN 1.5 1.6    Recent Results (from the past 240 hour(s))  Culture, blood (x 2)     Status: None (Preliminary result)   Collection Time: 07/15/21   3:58 AM   Specimen: BLOOD  Result Value Ref Range Status   Specimen Description BLOOD SITE NOT SPECIFIED  Final   Special Requests   Final    BOTTLES DRAWN AEROBIC ONLY Blood Culture adequate volume   Culture   Final    NO GROWTH < 12 HOURS Performed at [facility], [address], [place], [place] [address]    Report Status PENDING  Incomplete  Culture, blood (x 2)     Status: None (Preliminary result)   Collection Time: 07/15/21  3:58 AM   Specimen: BLOOD  Result Value Ref Range Status   Specimen Description BLOOD SITE NOT SPECIFIED  Final   Special Requests   Final    BOTTLES DRAWN AEROBIC ONLY Blood Culture adequate volume   Culture  Setup Time   Final    GRAM POSITIVE COCCI IN CLUSTERS AEROBIC BOTTLE ONLY CRITICAL RESULT CALLED TO, READ BACK BY AND VERIFIED WITH: PHARMD C PIERCE [id number] AT 726 AM BY CM Performed at [facility], [place] [address], [place], [place] [address]    Culture GRAM POSITIVE COCCI  Final   Report Status PENDING  Incomplete  Blood Culture ID Panel (Reflexed)     Status: Abnormal   Collection Time: 07/15/21  3:58 AM  Result Value Ref Range Status   Enterococcus faecalis NOT DETECTED NOT DETECTED Final   Enterococcus Faecium NOT DETECTED NOT DETECTED Final   Listeria monocytogenes NOT DETECTED NOT DETECTED Final   Staphylococcus species DETECTED (A) NOT DETECTED Final    Comment: CRITICAL RESULT CALLED TO, READ BACK BY AND VERIFIED WITH: PHARMD C PIERCE [id number] AT 725 AM BY CM    Staphylococcus aureus (BCID) NOT DETECTED NOT DETECTED Final   Staphylococcus epidermidis DETECTED (A) NOT DETECTED Final    Comment: Methicillin (oxacillin) resistant coagulase negative staphylococcus. Possible blood culture contaminant (unless isolated from more than one blood culture draw or clinical case suggests pathogenicity). No antibiotic treatment is indicated for blood  culture contaminants. CRITICAL RESULT CALLED TO, READ BACK BY AND VERIFIED WITH: PHARMD  C PIERCE [id number] AT 725 AM BY CM    Staphylococcus lugdunensis NOT DETECTED NOT DETECTED Final   Streptococcus species NOT DETECTED NOT DETECTED Final   Streptococcus agalactiae NOT DETECTED NOT DETECTED Final   Streptococcus pneumoniae NOT DETECTED NOT DETECTED Final   Streptococcus pyogenes NOT DETECTED NOT DETECTED Final   A.calcoaceticus-baumannii NOT DETECTED NOT DETECTED Final   Bacteroides fragilis NOT DETECTED  NOT DETECTED Final   Enterobacterales NOT DETECTED NOT DETECTED Final   Enterobacter cloacae complex NOT DETECTED NOT DETECTED Final   Escherichia coli NOT DETECTED NOT DETECTED Final   Klebsiella aerogenes NOT DETECTED NOT DETECTED Final   Klebsiella oxytoca NOT DETECTED NOT DETECTED Final   Klebsiella pneumoniae NOT DETECTED NOT DETECTED Final   Proteus species NOT DETECTED NOT DETECTED Final   Salmonella species NOT DETECTED NOT DETECTED Final   Serratia marcescens NOT DETECTED NOT DETECTED Final   Haemophilus influenzae NOT DETECTED NOT DETECTED Final   Neisseria meningitidis NOT DETECTED NOT DETECTED Final   Pseudomonas aeruginosa NOT DETECTED NOT DETECTED Final   Stenotrophomonas maltophilia NOT DETECTED NOT DETECTED Final   Candida albicans NOT DETECTED NOT DETECTED Final   Candida auris NOT DETECTED NOT DETECTED Final   Candida glabrata NOT DETECTED NOT DETECTED Final   Candida krusei NOT DETECTED NOT DETECTED Final   Candida parapsilosis NOT DETECTED NOT DETECTED Final   Candida tropicalis NOT DETECTED NOT DETECTED Final   Cryptococcus neoformans/gattii NOT DETECTED NOT DETECTED Final   Methicillin resistance mecA/C DETECTED (A) NOT DETECTED Final    Comment: CRITICAL RESULT CALLED TO, READ BACK BY AND VERIFIED WITH: PHARMD C PIERCE [id number] AT 725 AM BY CM Performed at [facility], [address], [place], [place] [address]   Aerobic Culture w Gram Stain (superficial specimen)     Status: None (Preliminary result)   Collection Time: 07/15/21  4:30 AM    Specimen: Wound  Result Value Ref Range Status   Specimen Description WOUND  Final   Special Requests POSTERIOR LEFT LEG  Final   Gram Stain   Final    NO WBC SEEN ABUNDANT GRAM POSITIVE COCCI ABUNDANT GRAM NEGATIVE RODS ABUNDANT GRAM POSITIVE RODS Performed at [facility], [address] [place], [place] [address]    Culture PENDING  Incomplete   Report Status PENDING  Incomplete  Aerobic Culture w Gram Stain (superficial specimen)     Status: None (Preliminary result)   Collection Time: 07/15/21  4:30 AM   Specimen: Wound  Result Value Ref Range Status   Specimen Description WOUND  Final   Special Requests LEFT FRONT LEG  Final   Gram Stain   Final    NO WBC SEEN ABUNDANT GRAM POSITIVE COCCI FEW GRAM POSITIVE RODS FEW GRAM NEGATIVE RODS Performed at [facility], [place] [address], [place], [place] [address]    Culture PENDING  Incomplete   Report Status PENDING  Incomplete         Radiology Studies: No results found.      Scheduled Meds:  amiodarone  200 mg Oral Daily   apixaban  5 mg Oral BID   calcium acetate  667 mg Oral TID WC   Chlorhexidine Gluconate Cloth  6 each Topical Daily   Chlorhexidine Gluconate Cloth  6 each Topical Q0600   darbepoetin (ARANESP) injection - DIALYSIS  60 mcg Intravenous Q Mon-HD   DULoxetine  20 mg Oral Daily   feeding supplement (NEPRO CARB STEADY)  237 mL Oral BID BM   insulin aspart  0-15 Units Subcutaneous TID WC   insulin aspart  0-5 Units Subcutaneous QHS   mouth rinse  15 mL Mouth Rinse BID   midodrine  15 mg Oral TID WC   multivitamin  1 tablet Oral QHS   polyethylene glycol  17 g Oral Daily   senna  1 tablet Oral Daily   sodium  chloride flush  3 mL Intravenous Q12H   Continuous Infusions:  sodium chloride     sodium chloride     ceFEPime (MAXIPIME) IV 1 g (07/15/21 1103)   DAPTOmycin (CUBICIN)  IV     promethazine (PHENERGAN) injection (IM or IVPB) Stopped (07/02/21 1831)     LOS: 25 days   Time  spent= 35 mins    [place] Arsenio Loader, MD [facility]  If 7PM-7AM, please contact night-coverage  07/16/2021, 8:09 AM

## 2021-07-16 NOTE — Progress Notes (Signed)
Nutrition Follow Up  DOCUMENTATION CODES:   Not applicable  INTERVENTION:   Increase Nepro Shake po TID, each supplement provides 425 kcal and 19 grams protein Continue renal MVI daily   NUTRITION DIAGNOSIS:   Increased nutrient needs related to wound healing as evidenced by estimated needs.  Ongoing  GOAL:   Patient will meet greater than or equal to 90% of their needs  Decreased   MONITOR:   PO intake, Supplement acceptance, Weight trends, I & O's, Labs  REASON FOR ASSESSMENT:   Rounds    ASSESSMENT:   Patient with PMH significant for thyroid cancer, CHF, Chiari malformation, ESRD/HD, COPD, CAD, HLD, HTN, DM, and lymphedema. Presents this admission with cardiogenic shock from RV failure.  7/15- s/p DC cardioversion   Patient reports having decreased appetite over the last 2-3 days due to feeling tired. Last four meal completions charted as 25%, 25%, 20%, and 10%. Drinking Nepro BID. Discussed the importance of protein intake for preservation of lean body mass and to promote wound healing. Patient willing to increase Nepro to TID.   EDW: 135 kg  Current weight: 147.1 kg   Medications: phoslo, aranesp, SS novolog, miralax, senokot Labs: Na 133 (L) CBG 109-151  Diet Order:   Diet Order             Diet heart healthy/carb modified Room service appropriate? Yes with Assist; Fluid consistency: Thin; Fluid restriction: 1200 mL Fluid  Diet effective now                   EDUCATION NEEDS:   Education needs have been addressed  Skin:  Skin Assessment: Skin Integrity Issues: Skin Integrity Issues:: Stage II Stage II: R thigh  Last BM:  7/27  Height:   Ht Readings from Last 1 Encounters:  07/16/21 5' 5" (1.651 m)    Weight:   Wt Readings from Last 1 Encounters:  07/16/21 (!) 147.1 kg    BMI:  Body mass index is 53.97 kg/m.  Estimated Nutritional Needs:   Kcal:  1800-2000 kcal  Protein:  90-105 grams  Fluid:  >/= 1.8 L/day  Mariana Single MS, RD, LDN, CNSC Clinical Nutrition Pager listed in [place]

## 2021-07-16 NOTE — Progress Notes (Signed)
[facility] Progress Note   Outpatient orders: [facility], Monday Wednesday Friday, 4hr 15 minutes.  350 BFR/500 DFR  1K, 2.5 calcium, 138 sodium, EDW 135.5 kg, 15-gauge needles, machine temp 37  Assessment/ Plan:   1) ESRD  - will plan for routine HD Wed; she tolerated Monday via AVF with net 3971 ml.   eventually need to work toward doing HD up in chair. I'm not convinced she will be able to do that at this time; main issue is transfer from bed to chair.   - Tolerated HD 7/27 with net 2.5L UF - Plan for HD Fri and then Sat to help with volume.    2) Acute on chronic RV failure with shock, pulmonary HTN -CTA r/o'd PE -TTE with normal EF, mod LVH and severe RV dysfunction -RHC with elevated RA pressure (22), no marked change in pressures or CO with AVF occlusion -Her AVF does not appear to be very high flow.  Will  not ligate it. -on midodrine 15 TID -sildenafil stopped in light of ongoing hypotension UF as able with HD-  using low temp- albumin and profile to get volume off-   Eventually will stop using albumin as they cannnot give as OP but trying to maximize while she's in the hospital to remove excess volume. On max midodrine.    3) Volume/ hypertension: prior EDW 135.5kg. UF with HD.  Over EDW still but making progress, achieved 2.5 liters with last HD-  will continue to push-  lowest  weight recorded at 130-131 kg but she still has a lot of volume onboard   4) Anemia of Chronic Kidney Disease: Hb in 10-11s without ESA here to date. Now dipped under 10-  have added ESA   5) Secondary Hyperparathyroidism/Hyperphosphatemia: sensipar 60 qtx -  phos OK on phoslo -   6) DM2 with hyperglycemia: mgmt per primary service  Subjective:   Felt very tired this am and mildly nauseous only this am; ok during HD yest. Tolerated HD yest; able to sit on side of bed with assistance Open wound in left inner thigh   Objective:   BP 114/65 (BP Location: Right Wrist)    Pulse 73   Temp 98.2 F (36.8 C) (Oral)   Resp 15   Ht _0  (1.651 m)   Wt (!) 147.1 kg   SpO2 96%   BMI 53.97 kg/m   Intake/Output Summary (Last 24 hours) at 07/16/2021 2336 Last data filed at 07/15/2021 1845 Gross per 24 hour  Intake 340 ml  Output 2500 ml  Net -2160 ml   Weight change: 2 kg  Physical Exam: Gen:nad, sitting up in bed CVS:s1s2, rrr Resp: normal wob PQA:ESLPN, soft, nt PYY:FRTMYTRZNB bilateral LE's w/ chronic skin changes, wound left inner thigh Neuro: awake, alert, moves all ext spontaneously, speech clear and coherent HD access: lt Cimino +b/t  Imaging: CT FEMUR LEFT WO CONTRAST  Result Date: 07/16/2021 CLINICAL DATA:  Soft tissue infection suspected, hip, xray done EXAM: CT OF THE LOWER LEFT EXTREMITY WITHOUT CONTRAST TECHNIQUE: Multidetector CT imaging of the lower left extremity was performed according to the standard protocol. COMPARISON:  None. FINDINGS: Technical note: Examination is limited secondary to beam hardening artifact related to patient body habitus. Portions of the lateral thigh were not able to be included within the field of view. Bones/Joint/Cartilage No acute fracture. No dislocation. Alignment at the hip and knee joints are maintained. Medial compartment osteoarthritis of the left knee with subchondral cystic change of  the medial femoral condyle and medial tibial plateau. Trace knee joint effusion, nonspecific. Ligaments Suboptimally assessed by CT. Muscles and Tendons Mild generalized fatty atrophy of the thigh musculature. Soft tissues Prominent skin thickening most pronounced at the medial and posterior aspects of the mid to distal thigh. Prominent subcutaneous edema throughout the thigh with dependent fluid within the soft tissues. Numerous scattered soft tissue calcifications. Findings are relatively symmetric compared to the included portion of the contralateral right thigh. No organized fluid collection is evident. No soft tissue gas.  IMPRESSION: 1. Limited exam. 2. Prominent skin thickening most pronounced at the medial and posterior aspects of the mid to distal left thigh. Prominent subcutaneous edema throughout the thigh with dependent fluid within the soft tissues. Findings are relatively symmetric compared to the included portion of the contralateral right thigh. Findings are nonspecific and could reflect changes of chronic lymphedema or possibly cellulitis. No organized fluid collection is evident. No soft tissue gas. 3. No acute osseous abnormality. 4. Medial compartment osteoarthritis of the left knee. Electronically Signed   By: Davina Poke D.O.   On: 07/16/2021 08:11    Labs: BMET Recent Labs  Lab 07/10/21 0043 07/10/21 1316 07/11/21 0108 07/12/21 0051 07/13/21 0036 07/14/21 0046 07/15/21 0039 07/16/21 0015  NA 135 133* 132* 133* 131* 135 129* 133*  K 4.5 4.0 4.3 5.1 5.6* 4.6 4.8 4.8  CL 95* 97* 97* 94* 95* 96* 94* 95*  CO2 _0 GLUCOSE 149* 144* 131* 104* 112* 107* 113* 121*  BUN 33* 17 24* 31* 43* 33* 39* 30*  CREATININE 5.79* 3.72* 4.45* 5.69* 6.47* 5.46* 6.67* 5.38*  CALCIUM 8.7* 8.8* 9.1 9.1 8.9 9.0 9.0 9.3  PHOS 4.1 3.1 3.9 4.7* 5.1* 4.3  --   --    CBC Recent Labs  Lab 07/11/21 0108 07/12/21 0051 07/15/21 0039 07/16/21 0015  WBC 9.4 9.9 8.9 10.3  HGB 10.1* 10.4* 10.6* 9.8*  HCT 32.4* 33.0* 35.0* 31.2*  MCV 99.4 99.4 100.6* 99.7  PLT 182 189 187 199    Medications:     amiodarone  200 mg Oral Daily   apixaban  5 mg Oral BID   calcium acetate  667 mg Oral TID WC   Chlorhexidine Gluconate Cloth  6 each Topical Daily   Chlorhexidine Gluconate Cloth  6 each Topical Q0600   darbepoetin (ARANESP) injection - DIALYSIS  60 mcg Intravenous Q Mon-HD   DULoxetine  20 mg Oral Daily   feeding supplement (NEPRO CARB STEADY)  237 mL Oral BID BM   insulin aspart  0-15 Units Subcutaneous TID WC   insulin aspart  0-5 Units Subcutaneous QHS   mouth rinse  15 mL Mouth Rinse  BID   midodrine  15 mg Oral TID WC   multivitamin  1 tablet Oral QHS   polyethylene glycol  17 g Oral Daily   senna  1 tablet Oral Daily   sodium chloride flush  3 mL Intravenous Q12H      Otelia Santee, MD 07/16/2021, 9:24 AM

## 2021-07-16 NOTE — Consult Note (Signed)
[facility] for Infectious [facility]    Date of Admission:  06/21/2021     Reason for Consult: skin soft tissue breakdown    Referring Provider: Reesa Chew   Lines:  7/04-c rue midline Chronic LUE avf  Abx: 7/27-c dapto/cefepime  7/3-10 dapto      7/3-05 cefepime   Assessment: Chronic bilateral severe lymphedema Severe pulm hyptertension with chronic right heart failure Esrd on iHD  Dm2  55 yo female with above comorbidities originally admtted 7/03 with sepsis in setting rle cellulitis course prolonged due to right heart failure  On 7/27 has temperature of 100.3 with borderline soft bp which she had done intermittently (soft sbp) which team is concerned for sepsis. This is in setting of well defined skin maceration proximal left thigh  On clinical exam and ct scan it appears she doesn't have at this time soft tissue infection of the left thigh. The ct showed symmetrical and dependent tissue edema proximal thigh only without abscess. She has no increased induration/warmth/tenderness compared to the right thigh on exam and there is no erythema  It doesn't also appear to be calciphylaxis   She is very tenuous with right heart failure and severe pulm htn which predispose her to getting soft sbp   Superficial wound cx various organisms but is useless in this setting    While technically she doesn't have a fever, and bcx shows staph epi one of 2 set from 7/27 which appears to be contaminant, she has a midline rue that has been there for almost a month. I would remove it. And given tenuous status, I would have her on linezolid for 5 days from time of removal      Plan: Remove RUE midline Start linezolid 600 mg po bid; stop 5 days after midline removal (have written for 7 days to allow time for removal of rue midline) Aggressive lymphedema care Stop dapto/cefepime 5.   Will sign off, please call if further question 6.   Discussed with primary team/id  pharmacy    I spent 60 minute reviewing data/chart, and coordinating care and >50% direct face to face time providing counseling/discussing diagnostics/treatment plan with patient      ------------------------------------------------ Principal Problem:   Hypotension Active Problems:   Lymphedema of lower extremity   Morbid obesity ([place])   Obesity hypoventilation syndrome ([place])   Cellulitis   ESRD on dialysis ([place])   Elevated troponin   Type 2 diabetes mellitus with hyperglycemia (HCC)   Cardiogenic shock (HCC)   Bilateral calf pain   Pressure injury of skin    HPI: [name] is a 55 y.o. female chronic rhf/pulm htn, lymphedema, dm2, esrd on ihd via LUE avf, recent skin breakdown with borderline hemodynamics which ID consulted   Hx via chart review and discussion with patient Patient several admissions the past few months 7/03 admitted for borderline sbp with elevated lactate and RLE cellulitis, s/p 7 day bsAbx  She remains in hospital ?for volume overload  On 7/27 has temperature 100.3 and sbp 90s (which she has had intermittently). Lactate normal. There was concern for soft tissue infection as the left thigh has skin breakdown. However ct showed symmetric dependent swelling and no abscess  Dapto/cefepime started Bcx 1 of 2 set staph epi. She has rue midline for the duration of this admission. No cough, chest pain, new dyspnea No joint pain/back pain No other rash No n/v/diarrhea      Family History  Problem Relation Age of Onset   Colon cancer Mother 20   Stroke Mother 51   Coronary artery disease Mother     Social History   Tobacco Use   Smoking status: Never   Smokeless tobacco: Never  Vaping Use   Vaping Use: Never used  Substance Use Topics   Alcohol use: No    Alcohol/week: 0.0 standard drinks   Drug use: No    Allergies  Allergen Reactions   Contrast Media [Iodinated Diagnostic Agents] Anaphylaxis, Hives, Swelling and Other (See  Comments)    Dye for cardiac cath. Tongue swells   Pneumococcal Vaccines Swelling and Other (See Comments)    Turns skin black, and bodily swelling   Vancomycin Nausea And Vomiting and Other (See Comments)    Infusion "made me feel like I was dying" had to be readmitted to hospital    Review of Systems: ROS All Other ROS was negative, except mentioned above   Past Medical History:  Diagnosis Date   Allergy    Phreesia 11/04/2020   Anemia, unspecified    Anxiety    Arthritis    Phreesia 11/04/2020   Asthma    as a child   Cancer ([place])    thyroid   Cellulitis    CHF (congestive heart failure) ([place])    Chiari malformation    s/p surgery   Chiari malformation    Chronic kidney disease    Phreesia 11/04/2020   Chronic kidney disease, stage 5 ([place])    Chronic obstructive pulmonary disease, unspecified ([place])    Chronic pain    Complication of anesthesia 11/28/15   Resp arrest after  conscious  sedation   Compression of brain (HCC)    Constipation    COPD (chronic obstructive pulmonary disease) ([place])    Phreesia 11/04/2020   Coronary artery disease    40-50% mid LAD 04/29/09, Medical tx. (Dr. Gwenlyn Found)   Diabetes mellitus    Type II- reports being off all medication d/t it being controlled   Diabetes mellitus without complication ([place])    Phreesia 11/04/2020   Essential (primary) hypertension    Fibromyalgia    Fibromyalgia    History of blood transfusion    hemorrage duinrg pregancy   Hx of echocardiogram 10/2011   EF 55-60%   Hypercholesterolemia    Hyperlipidemia, unspecified    Hypertension    Hypertensive chronic kidney disease with stage 1 through stage 4 chronic kidney disease, or unspecified chronic kidney disease    Hypertensive chronic kidney disease with stage 5 chronic kidney disease or end stage renal disease (HCC)    Lymph edema    Lymphedema, not elsewhere classified    Morbid (severe) obesity due to excess calories (HCC)    Obesity hypoventilation  syndrome ([place])    On home oxygen therapy    "2L; 24/7" (12/21/2018)   Peritonitis, unspecified ([place])    Pneumonia    in past   Pulmonary hypertension ([place])    Renal disorder    M/W/F [facility] Pt started dialysis in Dec.2016   S/P colonoscopy 05/26/2007   Dr. Laural Golden sigmoid diverticulosis random biopsies benign   S/P endoscopy 05/01/2009   Dr. Penelope Coop pill-induced esophageal ulcerations distal to midesophagus, 2 small ulcers in the antrum of the stomach   Shortness of breath dyspnea    with any exertion or if heart rate  is irregular while on dialysis   Sleep apnea    reports that she no longer needs CPAP due to  weight loss   Sleep apnea, unspecified    Type 2 diabetes mellitus with diabetic nephropathy (HCC)    Type 2 diabetes mellitus with hyperglycemia (HCC)        Scheduled Meds:  amiodarone  200 mg Oral Daily   apixaban  5 mg Oral BID   calcium acetate  667 mg Oral TID WC   Chlorhexidine Gluconate Cloth  6 each Topical Daily   Chlorhexidine Gluconate Cloth  6 each Topical Q0600   darbepoetin (ARANESP) injection - DIALYSIS  60 mcg Intravenous Q Mon-HD   DULoxetine  20 mg Oral Daily   feeding supplement (NEPRO CARB STEADY)  237 mL Oral BID BM   insulin aspart  0-15 Units Subcutaneous TID WC   insulin aspart  0-5 Units Subcutaneous QHS   mouth rinse  15 mL Mouth Rinse BID   midodrine  15 mg Oral TID WC   multivitamin  1 tablet Oral QHS   polyethylene glycol  17 g Oral Daily   senna  1 tablet Oral Daily   sodium chloride flush  3 mL Intravenous Q12H   Continuous Infusions:  sodium chloride     sodium chloride     promethazine (PHENERGAN) injection (IM or IVPB) Stopped (07/02/21 1831)   PRN Meds:.acetaminophen, dextromethorphan-guaiFENesin, diphenhydrAMINE, docusate sodium, fentaNYL (SUBLIMAZE) injection, ipratropium-albuterol, lactulose, ondansetron (ZOFRAN) IV, oxyCODONE, polyethylene glycol, promethazine (PHENERGAN) injection (IM or IVPB)   OBJECTIVE: Blood  pressure 114/65, pulse 73, temperature 98.2 F (36.8 C), temperature source Oral, resp. rate 15, height _0  (1.651 m), weight (!) 147.1 kg, SpO2 96 %.  Physical Exam General/constitutional: no distress, pleasant, chronically ill appearing HEENT: Normocephalic, PER, Conj Clear, EOMI, Oropharynx clear Neck supple CV: rrr no mrg Lungs: clear to auscultation, normal respiratory effort Abd: Soft, Nontender Ext: bilateral brawny LE edema Skin: wrapped bilateral distal LE, left thigh well demarcated superficial skin maceration of 2 inch in diameter without surrounding erythema/warmth/fluctuance. Mildly tender but is so symmetrically in LE Neuro: nonfocal MSK: no peripheral joint swelling/tenderness/warmth; back spines nontender Psych: alert/oriented  Central line presence: yes RUE midline site no erythema/fluctuance/purulence    Lab Results Lab Results  Component Value Date   WBC 10.3 07/16/2021   HGB 9.8 (L) 07/16/2021   HCT 31.2 (L) 07/16/2021   MCV 99.7 07/16/2021   PLT 199 07/16/2021    Lab Results  Component Value Date   CREATININE 5.38 (H) 07/16/2021   BUN 30 (H) 07/16/2021   NA 133 (L) 07/16/2021   K 4.8 07/16/2021   CL 95 (L) 07/16/2021   CO2 27 07/16/2021    Lab Results  Component Value Date   ALT 6 06/28/2021   AST 38 06/28/2021   ALKPHOS 328 (H) 06/28/2021   BILITOT 0.6 06/28/2021      Microbiology: Recent Results (from the past 240 hour(s))  Culture, blood (x 2)     Status: None (Preliminary result)   Collection Time: 07/15/21  3:58 AM   Specimen: BLOOD  Result Value Ref Range Status   Specimen Description BLOOD SITE NOT SPECIFIED  Final   Special Requests   Final    BOTTLES DRAWN AEROBIC ONLY Blood Culture adequate volume   Culture   Final    NO GROWTH < 12 HOURS Performed at [facility], [address], [place], [place] [address]    Report Status PENDING  Incomplete  Culture, blood (x 2)     Status: None (Preliminary result)   Collection  Time: 07/15/21  3:58  AM   Specimen: BLOOD  Result Value Ref Range Status   Specimen Description BLOOD SITE NOT SPECIFIED  Final   Special Requests   Final    BOTTLES DRAWN AEROBIC ONLY Blood Culture adequate volume   Culture  Setup Time   Final    GRAM POSITIVE COCCI IN CLUSTERS AEROBIC BOTTLE ONLY CRITICAL RESULT CALLED TO, READ BACK BY AND VERIFIED WITH: PHARMD C PIERCE [id number] AT 46 AM BY CM Performed at [facility], [place] [address], [place], [place] [address]    Culture GRAM POSITIVE COCCI  Final   Report Status PENDING  Incomplete  Blood Culture ID Panel (Reflexed)     Status: Abnormal   Collection Time: 07/15/21  3:58 AM  Result Value Ref Range Status   Enterococcus faecalis NOT DETECTED NOT DETECTED Final   Enterococcus Faecium NOT DETECTED NOT DETECTED Final   Listeria monocytogenes NOT DETECTED NOT DETECTED Final   Staphylococcus species DETECTED (A) NOT DETECTED Final    Comment: CRITICAL RESULT CALLED TO, READ BACK BY AND VERIFIED WITH: PHARMD C PIERCE [id number] AT 725 AM BY CM    Staphylococcus aureus (BCID) NOT DETECTED NOT DETECTED Final   Staphylococcus epidermidis DETECTED (A) NOT DETECTED Final    Comment: Methicillin (oxacillin) resistant coagulase negative staphylococcus. Possible blood culture contaminant (unless isolated from more than one blood culture draw or clinical case suggests pathogenicity). No antibiotic treatment is indicated for blood  culture contaminants. CRITICAL RESULT CALLED TO, READ BACK BY AND VERIFIED WITH: PHARMD C PIERCE [id number] AT 74 AM BY CM    Staphylococcus lugdunensis NOT DETECTED NOT DETECTED Final   Streptococcus species NOT DETECTED NOT DETECTED Final   Streptococcus agalactiae NOT DETECTED NOT DETECTED Final   Streptococcus pneumoniae NOT DETECTED NOT DETECTED Final   Streptococcus pyogenes NOT DETECTED NOT DETECTED Final   A.calcoaceticus-baumannii NOT DETECTED NOT DETECTED Final   Bacteroides fragilis NOT DETECTED NOT  DETECTED Final   Enterobacterales NOT DETECTED NOT DETECTED Final   Enterobacter cloacae complex NOT DETECTED NOT DETECTED Final   Escherichia coli NOT DETECTED NOT DETECTED Final   Klebsiella aerogenes NOT DETECTED NOT DETECTED Final   Klebsiella oxytoca NOT DETECTED NOT DETECTED Final   Klebsiella pneumoniae NOT DETECTED NOT DETECTED Final   Proteus species NOT DETECTED NOT DETECTED Final   Salmonella species NOT DETECTED NOT DETECTED Final   Serratia marcescens NOT DETECTED NOT DETECTED Final   Haemophilus influenzae NOT DETECTED NOT DETECTED Final   Neisseria meningitidis NOT DETECTED NOT DETECTED Final   Pseudomonas aeruginosa NOT DETECTED NOT DETECTED Final   Stenotrophomonas maltophilia NOT DETECTED NOT DETECTED Final   Candida albicans NOT DETECTED NOT DETECTED Final   Candida auris NOT DETECTED NOT DETECTED Final   Candida glabrata NOT DETECTED NOT DETECTED Final   Candida krusei NOT DETECTED NOT DETECTED Final   Candida parapsilosis NOT DETECTED NOT DETECTED Final   Candida tropicalis NOT DETECTED NOT DETECTED Final   Cryptococcus neoformans/gattii NOT DETECTED NOT DETECTED Final   Methicillin resistance mecA/C DETECTED (A) NOT DETECTED Final    Comment: CRITICAL RESULT CALLED TO, READ BACK BY AND VERIFIED WITH: PHARMD C PIERCE [id number] AT 725 AM BY CM Performed at [facility], [address], [place], [place] [address]   Aerobic Culture w Gram Stain (superficial specimen)     Status: None (Preliminary result)   Collection Time: 07/15/21  4:30 AM   Specimen: Wound  Result Value Ref Range Status   Specimen Description WOUND  Final   Special Requests POSTERIOR LEFT LEG  Final   Gram Stain   Final    NO WBC SEEN ABUNDANT GRAM POSITIVE COCCI ABUNDANT GRAM NEGATIVE RODS ABUNDANT GRAM POSITIVE RODS Performed at [facility], [place] [address] [place], [place] [address]    Culture ABUNDANT GRAM NEGATIVE RODS  Final   Report Status PENDING  Incomplete  Aerobic  Culture w Gram Stain (superficial specimen)     Status: None (Preliminary result)   Collection Time: 07/15/21  4:30 AM   Specimen: Wound  Result Value Ref Range Status   Specimen Description WOUND  Final   Special Requests LEFT FRONT LEG  Final   Gram Stain   Final    NO WBC SEEN ABUNDANT GRAM POSITIVE COCCI FEW GRAM POSITIVE RODS FEW GRAM NEGATIVE RODS    Culture   Final    CULTURE REINCUBATED FOR BETTER GROWTH Performed at [facility], [place] [address] [place], [place] [address]    Report Status PENDING  Incomplete     Serology:    Imaging: If present, new imagings (plain films, ct scans, and mri) have been personally visualized and interpreted; radiology reports have been reviewed. Decision making incorporated into the Impression / Recommendations.  7/27 ct left femur 1. Limited exam. 2. Prominent skin thickening most pronounced at the medial and posterior aspects of the mid to distal left thigh. Prominent subcutaneous edema throughout the thigh with dependent fluid within the soft tissues. Findings are relatively symmetric compared to the included portion of the contralateral right thigh. Findings are nonspecific and could reflect changes of chronic lymphedema or possibly cellulitis. No organized fluid collection is evident. No soft tissue gas. 3. No acute osseous abnormality. 4. Medial compartment osteoarthritis of the left knee.   7/15 tte  1. Small LV, compressed by enlarged RV. Mild LV hypertrophy. EF 55-60%,  normal wall motion.   2. Severely dilated and moderately dysfunctional RV, D-shaped septum  consistent with RV pressure/volume overload.   3. Left atrial size was mildly dilated. No left atrial/left atrial  appendage thrombus was detected.   4. Right atrial size was severely dilated.   5. No PFO/ASD by color doppler.   6. Peak RV-RA gradient 55 mmHg . The tricuspid valve is abnormal.  Tricuspid valve regurgitation is moderate.   7. The mitral  valve is normal in structure. Trivial mitral valve  regurgitation. No evidence of mitral stenosis.   8. The aortic valve is tricuspid. Aortic valve regurgitation is trivial.  No aortic stenosis is present.   9. Normal caliber thoracic aorta with grade 3 plaque noted.    Jabier Mutton, [place] [facility] [phone] pager    07/16/2021, 10:43 AM

## 2021-07-16 NOTE — Progress Notes (Signed)
Spoke with RN Linh concerning midline removal, RN is aware of the order and okay to remove the line.

## 2021-07-16 NOTE — Progress Notes (Signed)
Patient arrived to the floor with a Air distribution mattress, CHG bath done, Cardiac monitoring initiated.VSS.

## 2021-07-16 NOTE — Progress Notes (Signed)
Physical Therapy Treatment Patient Details Name: [name] MRN: [id number] DOB: 07/18/1966 Today's Date: 07/16/2021    History of Present Illness 55 y.o. female presenting from [place] ED 7/3 with lethargy, hypotension and elevated lactic acid after d/c from [facility] earlier that day with septic shock and cellulitis. Patient found to have AMS, septic shock, acute respiratory failure, acute on chronic RV failure. Transferred to ICU on 7/12 due to hypotension. PMHX significant for obesity, DMII, COPD, CAD, lymphedema, ESRD on HD MWF and CHF.    PT Comments    Kreg bed brought into room yesterday.  This was the first tilt for the patient.  Emphasis on allaying fears, working on increasing tilt degrees, tolerance to standing and getting BP's.  Pt tolerated 1st day at 49* for 5 min at ~150 lbs of w/bearing atBP 125/83.  Pt expressed interest in continuing tilting program and generally taking some ownership to get staff to help her.    Follow Up Recommendations  SNF     Equipment Recommendations  Hospital bed;Other (comment) (TBA)    Recommendations for Other Services       Precautions / Restrictions Precautions Precautions: Fall Precaution Comments: bil LE edema, L LE/groin wounds Restrictions Weight Bearing Restrictions: No    Mobility  Bed Mobility Overal bed mobility: Needs Assistance             General bed mobility comments: use trendelenburg feature and bed padding to assist pt's significant efforts with the HOB frame to scoot up toward HOB.    Transfers                 General transfer comment: Use of KREG bed to stand. Able to acheive 49* tilt with tolerable pain with around 5 min of tolerance to peak tilt  Ambulation/Gait             General Gait Details: unable   Stairs             Wheelchair Mobility    Modified Rankin (Stroke Patients Only)       Balance                                            Cognition  Arousal/Alertness: Awake/alert Behavior During Therapy: WFL for tasks assessed/performed Overall Cognitive Status: Within Functional Limits for tasks assessed                                 General Comments: very pleasant, anxious with movement but motivated to participate      Exercises Other Exercises Other Exercises: pushing through B UE on bedrails to facilitate improved weightbearing/weightshifting    General Comments General comments (skin integrity, edema, etc.): BP 125/83 (85) at peak tilt. Collaborated with RN who was present on tilt bed functions and ideal 3x/day tilt. Desats to 88% on O2 with flat bed      Pertinent Vitals/Pain Pain Assessment: Faces Faces Pain Scale: Hurts even more Pain Location: LEs Pain Descriptors / Indicators: Grimacing;Guarding;Sore;Burning;Aching Pain Intervention(s): Monitored during session    Home Living                      Prior Function            PT Goals (current goals can now be found in the  care plan section) Acute Rehab PT Goals Patient Stated Goal: get stronger and return home PT Goal Formulation: With patient Time For Goal Achievement: 07/23/21 Potential to Achieve Goals: Fair Progress towards PT goals: Progressing toward goals    Frequency    Min 2X/week      PT Plan Current plan remains appropriate    Co-evaluation PT/OT/SLP Co-Evaluation/Treatment: Yes Reason for Co-Treatment: For patient/therapist safety PT goals addressed during session: Mobility/safety with mobility;Strengthening/ROM OT goals addressed during session: Strengthening/ROM;Other (comment) (standing tolerance for ADL transfers)      AM-PAC PT "6 Clicks" Mobility   Outcome Measure  Help needed turning from your back to your side while in a flat bed without using bedrails?: Total Help needed moving from lying on your back to sitting on the side of a flat bed without using bedrails?: Total Help needed moving to and from  a bed to a chair (including a wheelchair)?: Total Help needed standing up from a chair using your arms (e.g., wheelchair or bedside chair)?: Total Help needed to walk in hospital room?: Total Help needed climbing 3-5 steps with a railing? : Total 6 Click Score: 6    End of Session Equipment Utilized During Treatment: Oxygen Activity Tolerance: Patient tolerated treatment well Patient left: in bed;with bed alarm set;with call bell/phone within reach Nurse Communication: Mobility status;Need for lift equipment PT Visit Diagnosis: Other abnormalities of gait and mobility (R26.89);Muscle weakness (generalized) (M62.81);Pain Pain - Right/Left:  (bil) Pain - part of body:  (LE's)     Time: 8159-4707 PT Time Calculation (min) (ACUTE ONLY): 40 min  Charges:  $Therapeutic Activity: 8-22 mins                     07/16/2021  Lindsay Carne., PT Acute Rehabilitation Services [phone]  (pager) [phone]  (office)   [name] 07/16/2021, 12:52 PM

## 2021-07-17 DIAGNOSIS — I9589 Other hypotension: Secondary | ICD-10-CM | POA: Diagnosis not present

## 2021-07-17 DIAGNOSIS — M79661 Pain in right lower leg: Secondary | ICD-10-CM | POA: Diagnosis not present

## 2021-07-17 DIAGNOSIS — R570 Cardiogenic shock: Secondary | ICD-10-CM | POA: Diagnosis not present

## 2021-07-17 DIAGNOSIS — L03115 Cellulitis of right lower limb: Secondary | ICD-10-CM | POA: Diagnosis not present

## 2021-07-17 LAB — COMPREHENSIVE METABOLIC PANEL
ALT: 12 U/L (ref 0–44)
AST: 16 U/L (ref 15–41)
Albumin: 3.1 g/dL — ABNORMAL LOW (ref 3.5–5.0)
Alkaline Phosphatase: 257 U/L — ABNORMAL HIGH (ref 38–126)
Anion gap: 12 (ref 5–15)
BUN: 42 mg/dL — ABNORMAL HIGH (ref 6–20)
CO2: 27 mmol/L (ref 22–32)
Calcium: 9.3 mg/dL (ref 8.9–10.3)
Chloride: 94 mmol/L — ABNORMAL LOW (ref 98–111)
Creatinine, Ser: 6.42 mg/dL — ABNORMAL HIGH (ref 0.44–1.00)
GFR, Estimated: 7 mL/min — ABNORMAL LOW (ref >60)
Glucose, Bld: 135 mg/dL — ABNORMAL HIGH (ref 70–99)
Potassium: 4.7 mmol/L (ref 3.5–5.1)
Sodium: 133 mmol/L — ABNORMAL LOW (ref 135–145)
Total Bilirubin: 0.7 mg/dL (ref 0.3–1.2)
Total Protein: 6.7 g/dL (ref 6.5–8.1)

## 2021-07-17 LAB — AEROBIC CULTURE W GRAM STAIN (SUPERFICIAL SPECIMEN)
Gram Stain: NONE SEEN
Gram Stain: NONE SEEN

## 2021-07-17 LAB — GLUCOSE, CAPILLARY
Glucose-Capillary: 111 mg/dL — ABNORMAL HIGH (ref 70–99)
Glucose-Capillary: 141 mg/dL — ABNORMAL HIGH (ref 70–99)
Glucose-Capillary: 165 mg/dL — ABNORMAL HIGH (ref 70–99)
Glucose-Capillary: 126 mg/dL — ABNORMAL HIGH (ref 70–99)

## 2021-07-17 LAB — CBC
HCT: 33.1 % — ABNORMAL LOW (ref 36.0–46.0)
Hemoglobin: 10.4 g/dL — ABNORMAL LOW (ref 12.0–15.0)
MCH: 31.1 pg (ref 26.0–34.0)
MCHC: 31.4 g/dL (ref 30.0–36.0)
MCV: 99.1 fL (ref 80.0–100.0)
Platelets: 214 10*3/uL (ref 150–400)
RBC: 3.34 MIL/uL — ABNORMAL LOW (ref 3.87–5.11)
RDW: 17.3 % — ABNORMAL HIGH (ref 11.5–15.5)
WBC: 8.8 10*3/uL (ref 4.0–10.5)
nRBC: 0.3 % — ABNORMAL HIGH (ref 0.0–0.2)

## 2021-07-17 LAB — MAGNESIUM: Magnesium: 2.5 mg/dL — ABNORMAL HIGH (ref 1.7–2.4)

## 2021-07-17 MED ORDER — MIDODRINE HCL 5 MG PO TABS
ORAL_TABLET | ORAL | Status: AC
  Filled 2021-07-17: qty 3

## 2021-07-17 NOTE — Progress Notes (Signed)
PROGRESS NOTE  [name]  QVZ:[id number] DOB: July 04, 1966 DOA: 06/21/2021 PCP: Noreene Larsson, NP   Brief Narrative: 55 year old female with a history of end-stage renal disease on hemodialysis, has had a prolonged hospitalization since she was admitted on 06/21/2021 for cellulitis and concerns for sepsis.  She was treated with antibiotics, but had continued issues with hypotension.  Upon further evaluation, it was felt that her hypotension was more related to cardiogenic shock from RV failure and pulmonary hypertension group 3.  She is being followed by cardiology, nephrology and PCCM.  She is intermittently required vasopressors for hypotension.  She has been off vasopressors since 7/20 and has tolerated hemodialysis.  Currently on midodrine.  Transferred to [facility] service on 7/23.  Overnight on 7/26 patient became tachycardic and hypotensive concerns for left lower extremity cellulitis/sepsis started on IV daptomycin and cefepime.  CT of the thigh was ordered, patient was seen by wound care.  Infectious disease team consulted, remove midline and start linezolid twice daily.  Assessment & Plan: Principal Problem:   Hypotension Active Problems:   Lymphedema of lower extremity   Morbid obesity (HCC)   Obesity hypoventilation syndrome (HCC)   Cellulitis   ESRD on dialysis ([place])   Staphylococcus epidermidis bacteremia   Elevated troponin   Chronic right heart failure (HCC)   Type 2 diabetes mellitus with hyperglycemia (HCC)   Lymphedema   Cardiogenic shock (HCC)   Bilateral calf pain   Pressure injury of skin   Pulmonary hypertension (HCC)  Left lower extremity cellulitis with purulent discharge, 5 cm X 11 cm full-thickness wound in medial and posterior upper thigh with B/L Lymphadenopathy - Patient seen by infectious disease, less evidence of bacterial infection therefore antibiotics discontinued - Discontinue right upper extremity midline.  Per RN patient does have other  access -Linezolid at least for 5 days after midline removal -Compression stockings for lymphedema - CT lower extremity-chronic inflammation and lymphedema   Cardiogenic shock due to RV failure Pulmonary hypertension Acute on chronic diastolic congestive heart failure -Currently on midodrine 44m tid.  Off vasopressors since 7/20. HD today 7/29, again tomorrow 7/30 to spread volume reduction and stabilize BP. Pt will need to be able to transfer to HD chair and sit for full treatment prior to discharge.  - Has large AV fistula but occluded therefore no ligation necessary - CHF team - Signed off - Sildenafil stopped   Intermittent atypical atrial flutter, now in sinus rhythm -Status post cardioversion 7/15.  On amiodarone and Eliquis   End-stage renal disease -Nephrology following.  HD per nephrology discretion - Needs to sit up in chair   Suspected OSA -Bedtime CPAP.  Will need outpatient sleep study   Fibromyalgia -Continue Cymbalta   Anemia of chronic kidney disease -No evidence of bleeding.  Hemoglobin stable.  Continue Aranesp   DM2 with hyperglycemia, range 109-151 in last 24 hours -A1c 6.9.  Sliding scale and Accu-Chek   Generalized weakness -PT recommends SNF placement   Goals of care -Due to multiple medical comorbidities and acute ongoing issues.  Palliative care team was consulted.  Patient wishes to be full code with full scope of treatment, hopeful for improvement and going to rehab.    RN Pressure Injury Documentation: Pressure Injury 07/04/21 Thigh Posterior;Proximal;Right Stage 2 -  Partial thickness loss of dermis presenting as a shallow open injury with a red, pink wound bed without slough. (Active)  07/04/21 1200  Location: Thigh  Location Orientation: Posterior;Proximal;Right  Staging: Stage 2 -  Partial thickness loss of dermis presenting as a shallow open injury with a red, pink wound bed without slough.  Wound Description (Comments):   Present on  Admission: No    Obesity: Estimated body mass index is 54.26 kg/m as calculated from the following:   Height as of this encounter: 5' 5" (1.651 m).   Weight as of this encounter: 147.9 kg.  DVT prophylaxis: Eliquis Code Status: Full Family Communication: None at bedside Disposition Plan:  Status is: Inpatient  Remains inpatient appropriate because:Inpatient level of care appropriate due to severity of illness  Dispo: The patient is from: Home              Anticipated d/c is to: SNF              Patient currently is not medically stable to d/c.   Difficult to place patient No  Consultants:  Nephrology ID  Subjective: Area of prior midline is slightly tender but better, no fever. No chest pain or dyspnea. BPs improved of late.   Objective: Vitals:   07/17/21 1200 07/17/21 1220 07/17/21 1230 07/17/21 1617  BP: 125/75 126/68 108/66 130/75  Pulse:  64 (!) 55 63  Resp:  (!) _0 Temp:  97.6 F (36.4 C) 97.6 F (36.4 C) 97.7 F (36.5 C)  TempSrc:    Oral  SpO2:  100% 100% 96%  Weight:      Height:        Intake/Output Summary (Last 24 hours) at 07/17/2021 1804 Last data filed at 07/17/2021 1230 Gross per 24 hour  Intake 240 ml  Output 2957 ml  Net -2717 ml   Filed Weights   07/16/21 0329 07/16/21 0333 07/17/21 0420  Weight: (!) 137 kg (!) 147.1 kg (!) 147.9 kg    Gen: 55 y.o. female in no distress Pulm: Non-labored breathing O2. Clear to auscultation bilaterally.  CV: Regular rate and rhythm. No murmur, rub, or gallop. No JVD, + pedal edema. GI: Abdomen soft, non-tender, non-distended, with normoactive bowel sounds. No organomegaly or masses felt. Ext: Warm, no deformities Skin: Left anterior/medial thigh with wound dressing c/d/I. Bilateral LE's with edema, pitting.  Neuro: Alert and oriented. No focal neurological deficits. Psych: Judgement and insight appear normal. Mood & affect appropriate.   Data Reviewed: I have personally reviewed following labs  and imaging studies  CBC: Recent Labs  Lab 07/11/21 0108 07/12/21 0051 07/15/21 0039 07/16/21 0015 07/17/21 0047  WBC 9.4 9.9 8.9 10.3 8.8  HGB 10.1* 10.4* 10.6* 9.8* 10.4*  HCT 32.4* 33.0* 35.0* 31.2* 33.1*  MCV 99.4 99.4 100.6* 99.7 99.1  PLT 182 189 187 199 163   Basic Metabolic Panel: Recent Labs  Lab 07/11/21 0108 07/12/21 0051 07/13/21 0036 07/14/21 0046 07/15/21 0039 07/16/21 0015 07/17/21 0047  NA 132* 133* 131* 135 129* 133* 133*  K 4.3 5.1 5.6* 4.6 4.8 4.8 4.7  CL 97* 94* 95* 96* 94* 95* 94*  CO2 _1 GLUCOSE 131* 104* 112* 107* 113* 121* 135*  BUN 24* 31* 43* 33* 39* 30* 42*  CREATININE 4.45* 5.69* 6.47* 5.46* 6.67* 5.38* 6.42*  CALCIUM 9.1 9.1 8.9 9.0 9.0 9.3 9.3  MG  --   --   --   --  2.4 2.3 2.5*  PHOS 3.9 4.7* 5.1* 4.3  --   --   --    GFR: Estimated Creatinine Clearance: 14.8 mL/min (A) (by C-G formula based on SCr of 6.42  mg/dL (H)). Liver Function Tests: Recent Labs  Lab 07/11/21 0108 07/12/21 0051 07/13/21 0036 07/14/21 0046 07/17/21 0047  AST  --   --   --   --  16  ALT  --   --   --   --  12  ALKPHOS  --   --   --   --  257*  BILITOT  --   --   --   --  0.7  PROT  --   --   --   --  6.7  ALBUMIN 3.1* 3.0* 2.9* 2.9* 3.1*   No results for input(s): LIPASE, AMYLASE in the last 168 hours. No results for input(s): AMMONIA in the last 168 hours. Coagulation Profile: Recent Labs  Lab 07/15/21 0420  INR 1.9*   Cardiac Enzymes: Recent Labs  Lab 07/16/21 0015  CKTOTAL 11*   BNP (last 3 results) No results for input(s): PROBNP in the last 8760 hours. HbA1C: No results for input(s): HGBA1C in the last 72 hours. CBG: Recent Labs  Lab 07/16/21 1602 07/16/21 2055 07/17/21 0619 07/17/21 1357 07/17/21 1701  GLUCAP 158* 126* 111* 141* 165*   Lipid Profile: No results for input(s): CHOL, HDL, LDLCALC, TRIG, CHOLHDL, LDLDIRECT in the last 72 hours. Thyroid Function Tests: No results for input(s): TSH, T4TOTAL,  FREET4, T3FREE, THYROIDAB in the last 72 hours. Anemia Panel: No results for input(s): VITAMINB12, FOLATE, FERRITIN, TIBC, IRON, RETICCTPCT in the last 72 hours. Urine analysis:    Component Value Date/Time   COLORURINE YELLOW 08/11/2016 1800   APPEARANCEUR HAZY (A) 08/11/2016 1800   LABSPEC 1.020 08/11/2016 1800   PHURINE 5.5 08/11/2016 1800   GLUCOSEU NEGATIVE 08/11/2016 1800   HGBUR MODERATE (A) 08/11/2016 1800   BILIRUBINUR NEGATIVE 08/11/2016 1800   KETONESUR NEGATIVE 08/11/2016 1800   PROTEINUR 100 (A) 08/11/2016 1800   UROBILINOGEN 0.2 12/02/2014 1925   NITRITE NEGATIVE 08/11/2016 1800   LEUKOCYTESUR NEGATIVE 08/11/2016 1800   Recent Results (from the past 240 hour(s))  Culture, blood (x 2)     Status: None (Preliminary result)   Collection Time: 07/15/21  3:58 AM   Specimen: BLOOD  Result Value Ref Range Status   Specimen Description BLOOD SITE NOT SPECIFIED  Final   Special Requests   Final    BOTTLES DRAWN AEROBIC ONLY Blood Culture adequate volume   Culture   Final    NO GROWTH 1 DAY Performed at [facility], [place] [address], [place], [place] [address]    Report Status PENDING  Incomplete  Culture, blood (x 2)     Status: Abnormal (Preliminary result)   Collection Time: 07/15/21  3:58 AM   Specimen: BLOOD  Result Value Ref Range Status   Specimen Description BLOOD SITE NOT SPECIFIED  Final   Special Requests   Final    BOTTLES DRAWN AEROBIC ONLY Blood Culture adequate volume   Culture  Setup Time   Final    GRAM POSITIVE COCCI IN CLUSTERS AEROBIC BOTTLE ONLY CRITICAL RESULT CALLED TO, READ BACK BY AND VERIFIED WITH: PHARMD C PIERCE [id number] AT 726 AM BY CM Performed at [facility], [place] [address] [place], [place] [address]    Culture STAPHYLOCOCCUS EPIDERMIDIS (A)  Final   Report Status PENDING  Incomplete  Blood Culture ID Panel (Reflexed)     Status: Abnormal   Collection Time: 07/15/21  3:58 AM  Result Value Ref Range Status   Enterococcus  faecalis NOT DETECTED NOT DETECTED Final  Enterococcus Faecium NOT DETECTED NOT DETECTED Final   Listeria monocytogenes NOT DETECTED NOT DETECTED Final   Staphylococcus species DETECTED (A) NOT DETECTED Final    Comment: CRITICAL RESULT CALLED TO, READ BACK BY AND VERIFIED WITH: PHARMD C PIERCE [id number] AT 725 AM BY CM    Staphylococcus aureus (BCID) NOT DETECTED NOT DETECTED Final   Staphylococcus epidermidis DETECTED (A) NOT DETECTED Final    Comment: Methicillin (oxacillin) resistant coagulase negative staphylococcus. Possible blood culture contaminant (unless isolated from more than one blood culture draw or clinical case suggests pathogenicity). No antibiotic treatment is indicated for blood  culture contaminants. CRITICAL RESULT CALLED TO, READ BACK BY AND VERIFIED WITH: PHARMD C PIERCE [id number] AT 61 AM BY CM    Staphylococcus lugdunensis NOT DETECTED NOT DETECTED Final   Streptococcus species NOT DETECTED NOT DETECTED Final   Streptococcus agalactiae NOT DETECTED NOT DETECTED Final   Streptococcus pneumoniae NOT DETECTED NOT DETECTED Final   Streptococcus pyogenes NOT DETECTED NOT DETECTED Final   A.calcoaceticus-baumannii NOT DETECTED NOT DETECTED Final   Bacteroides fragilis NOT DETECTED NOT DETECTED Final   Enterobacterales NOT DETECTED NOT DETECTED Final   Enterobacter cloacae complex NOT DETECTED NOT DETECTED Final   Escherichia coli NOT DETECTED NOT DETECTED Final   Klebsiella aerogenes NOT DETECTED NOT DETECTED Final   Klebsiella oxytoca NOT DETECTED NOT DETECTED Final   Klebsiella pneumoniae NOT DETECTED NOT DETECTED Final   Proteus species NOT DETECTED NOT DETECTED Final   Salmonella species NOT DETECTED NOT DETECTED Final   Serratia marcescens NOT DETECTED NOT DETECTED Final   Haemophilus influenzae NOT DETECTED NOT DETECTED Final   Neisseria meningitidis NOT DETECTED NOT DETECTED Final   Pseudomonas aeruginosa NOT DETECTED NOT DETECTED Final   Stenotrophomonas  maltophilia NOT DETECTED NOT DETECTED Final   Candida albicans NOT DETECTED NOT DETECTED Final   Candida auris NOT DETECTED NOT DETECTED Final   Candida glabrata NOT DETECTED NOT DETECTED Final   Candida krusei NOT DETECTED NOT DETECTED Final   Candida parapsilosis NOT DETECTED NOT DETECTED Final   Candida tropicalis NOT DETECTED NOT DETECTED Final   Cryptococcus neoformans/gattii NOT DETECTED NOT DETECTED Final   Methicillin resistance mecA/C DETECTED (A) NOT DETECTED Final    Comment: CRITICAL RESULT CALLED TO, READ BACK BY AND VERIFIED WITH: PHARMD C PIERCE [id number] AT 725 AM BY CM Performed at [facility], [address] [place], [place] [address]   Aerobic Culture w Gram Stain (superficial specimen)     Status: None   Collection Time: 07/15/21  4:30 AM   Specimen: Wound  Result Value Ref Range Status   Specimen Description WOUND  Final   Special Requests POSTERIOR LEFT LEG  Final   Gram Stain   Final    NO WBC SEEN ABUNDANT GRAM POSITIVE COCCI ABUNDANT GRAM NEGATIVE RODS ABUNDANT GRAM POSITIVE RODS    Culture   Final    ABUNDANT MULTIPLE ORGANISMS PRESENT, NONE PREDOMINANT NO STAPHYLOCOCCUS AUREUS ISOLATED Performed at [facility], [place] [address] [place], [place] [address]    Report Status 07/17/2021 FINAL  Final  Aerobic Culture w Gram Stain (superficial specimen)     Status: None   Collection Time: 07/15/21  4:30 AM   Specimen: Wound  Result Value Ref Range Status   Specimen Description WOUND  Final   Special Requests LEFT FRONT LEG  Final   Gram Stain   Final    NO WBC SEEN ABUNDANT GRAM POSITIVE COCCI FEW GRAM POSITIVE RODS  FEW GRAM NEGATIVE RODS    Culture   Final    ABUNDANT MULTIPLE ORGANISMS PRESENT, NONE PREDOMINANT NO STAPHYLOCOCCUS AUREUS ISOLATED Performed at [facility], [address], [place], [place] [address]    Report Status 07/17/2021 FINAL  Final      Radiology Studies: CT FEMUR LEFT WO CONTRAST  Result Date:  07/16/2021 CLINICAL DATA:  Soft tissue infection suspected, hip, xray done EXAM: CT OF THE LOWER LEFT EXTREMITY WITHOUT CONTRAST TECHNIQUE: Multidetector CT imaging of the lower left extremity was performed according to the standard protocol. COMPARISON:  None. FINDINGS: Technical note: Examination is limited secondary to beam hardening artifact related to patient body habitus. Portions of the lateral thigh were not able to be included within the field of view. Bones/Joint/Cartilage No acute fracture. No dislocation. Alignment at the hip and knee joints are maintained. Medial compartment osteoarthritis of the left knee with subchondral cystic change of the medial femoral condyle and medial tibial plateau. Trace knee joint effusion, nonspecific. Ligaments Suboptimally assessed by CT. Muscles and Tendons Mild generalized fatty atrophy of the thigh musculature. Soft tissues Prominent skin thickening most pronounced at the medial and posterior aspects of the mid to distal thigh. Prominent subcutaneous edema throughout the thigh with dependent fluid within the soft tissues. Numerous scattered soft tissue calcifications. Findings are relatively symmetric compared to the included portion of the contralateral right thigh. No organized fluid collection is evident. No soft tissue gas. IMPRESSION: 1. Limited exam. 2. Prominent skin thickening most pronounced at the medial and posterior aspects of the mid to distal left thigh. Prominent subcutaneous edema throughout the thigh with dependent fluid within the soft tissues. Findings are relatively symmetric compared to the included portion of the contralateral right thigh. Findings are nonspecific and could reflect changes of chronic lymphedema or possibly cellulitis. No organized fluid collection is evident. No soft tissue gas. 3. No acute osseous abnormality. 4. Medial compartment osteoarthritis of the left knee. Electronically Signed   By: Davina Poke D.O.   On: 07/16/2021  08:11    Scheduled Meds:  amiodarone  200 mg Oral Daily   apixaban  5 mg Oral BID   calcium acetate  667 mg Oral TID WC   Chlorhexidine Gluconate Cloth  6 each Topical Daily   Chlorhexidine Gluconate Cloth  6 each Topical Q0600   darbepoetin (ARANESP) injection - DIALYSIS  60 mcg Intravenous Q Mon-HD   DULoxetine  20 mg Oral Daily   feeding supplement (NEPRO CARB STEADY)  237 mL Oral TID BM   insulin aspart  0-15 Units Subcutaneous TID WC   insulin aspart  0-5 Units Subcutaneous QHS   linezolid  600 mg Oral Q12H   mouth rinse  15 mL Mouth Rinse BID   midodrine  15 mg Oral TID WC   multivitamin  1 tablet Oral QHS   polyethylene glycol  17 g Oral Daily   senna  1 tablet Oral Daily   sodium chloride flush  3 mL Intravenous Q12H   Continuous Infusions:  sodium chloride     sodium chloride     promethazine (PHENERGAN) injection (IM or IVPB) Stopped (07/02/21 1831)     LOS: 26 days   Time spent: 25 minutes.  Patrecia Pour, MD [facility] [email or web address] 07/17/2021, 6:04 PM

## 2021-07-17 NOTE — Progress Notes (Signed)
RT placed patient on CPAP at this time per patient request. Patient tolerating settings well. No respiratory distress noted. RT will monitor as needed.

## 2021-07-17 NOTE — Progress Notes (Signed)
[facility] Progress Note   Outpatient orders: [facility], Monday Wednesday Friday, 4hr 15 minutes.  350 BFR/500 DFR  1K, 2.5 calcium, 138 sodium, EDW 135.5 kg, 15-gauge needles, machine temp 37  Assessment/ Plan:   1) ESRD  - Eventually need to work toward doing HD up in chair. I'm not convinced she will be able to do that at this time; main issue is transfer from bed to chair.   - Tolerated HD 7/27 with net 2.5L UF - Seen on  HD today  through left Cimino 3K bath 125/65  Goal net UF 3 L  Planning on HD  Sat to help with volume.    2) Acute on chronic RV failure with shock, pulmonary HTN -CTA r/o'd PE -TTE with normal EF, mod LVH and severe RV dysfunction -RHC with elevated RA pressure (22), no marked change in pressures or CO with AVF occlusion -Her AVF does not appear to be very high flow.  Will  not ligate it. -on midodrine 15 TID -sildenafil stopped in light of ongoing hypotension UF as able with HD-  using low temp- albumin and profile to get volume off-   Eventually will stop using albumin as they cannnot give as OP but trying to maximize while she's in the hospital to remove excess volume. On max midodrine.    3) Volume/ hypertension: prior EDW 135.5kg. UF with HD.  Over EDW still but making progress, achieved 2.5 liters with last HD-  will continue to push-  lowest  weight recorded at 130-131 kg but she still has a lot of volume onboard   4) Anemia of Chronic Kidney Disease: Hb in 10-11s without ESA here to date. Now dipped under 10-  have added ESA   5) Secondary Hyperparathyroidism/Hyperphosphatemia: sensipar 60 qtx -  phos OK on phoslo - Need to recheck phos   6) DM2 with hyperglycemia: mgmt per primary service  Subjective:   Felt very tired this am but doing better. Tolerating HD; able to sit on side of bed with assistance Open wound in left inner thigh   Objective:   BP 138/87 (BP Location: Right Wrist)   Pulse 60   Temp 97.6 F (36.4  C) (Oral)   Resp 20   Ht _0  (1.651 m)   Wt (!) 147.9 kg   SpO2 96%   BMI 54.26 kg/m   Intake/Output Summary (Last 24 hours) at 07/17/2021 0847 Last data filed at 07/16/2021 2100 Gross per 24 hour  Intake 580 ml  Output --  Net 580 ml   Weight change: 10.9 kg  Physical Exam: Gen:nad, sitting up in bed CVS:s1s2, rrr Resp: normal wob KKX:[id number], soft, nt [id number] bilateral LE's w/ chronic skin changes, wound left inner thigh Neuro: awake, alert, moves all ext spontaneously, speech clear and coherent HD access: lt Cimino +b/t  Imaging: CT FEMUR LEFT WO CONTRAST  Result Date: 07/16/2021 CLINICAL DATA:  Soft tissue infection suspected, hip, xray done EXAM: CT OF THE LOWER LEFT EXTREMITY WITHOUT CONTRAST TECHNIQUE: Multidetector CT imaging of the lower left extremity was performed according to the standard protocol. COMPARISON:  None. FINDINGS: Technical note: Examination is limited secondary to beam hardening artifact related to patient body habitus. Portions of the lateral thigh were not able to be included within the field of view. Bones/Joint/Cartilage No acute fracture. No dislocation. Alignment at the hip and knee joints are maintained. Medial compartment osteoarthritis of the left knee with subchondral cystic change of the medial femoral condyle and  medial tibial plateau. Trace knee joint effusion, nonspecific. Ligaments Suboptimally assessed by CT. Muscles and Tendons Mild generalized fatty atrophy of the thigh musculature. Soft tissues Prominent skin thickening most pronounced at the medial and posterior aspects of the mid to distal thigh. Prominent subcutaneous edema throughout the thigh with dependent fluid within the soft tissues. Numerous scattered soft tissue calcifications. Findings are relatively symmetric compared to the included portion of the contralateral right thigh. No organized fluid collection is evident. No soft tissue gas. IMPRESSION: 1. Limited exam. 2.  Prominent skin thickening most pronounced at the medial and posterior aspects of the mid to distal left thigh. Prominent subcutaneous edema throughout the thigh with dependent fluid within the soft tissues. Findings are relatively symmetric compared to the included portion of the contralateral right thigh. Findings are nonspecific and could reflect changes of chronic lymphedema or possibly cellulitis. No organized fluid collection is evident. No soft tissue gas. 3. No acute osseous abnormality. 4. Medial compartment osteoarthritis of the left knee. Electronically Signed   By: Davina Poke D.O.   On: 07/16/2021 08:11    Labs: BMET Recent Labs  Lab 07/10/21 1316 07/11/21 0108 07/12/21 0051 07/13/21 0036 07/14/21 0046 07/15/21 0039 07/16/21 0015 07/17/21 0047  NA 133* 132* 133* 131* 135 129* 133* 133*  K 4.0 4.3 5.1 5.6* 4.6 4.8 4.8 4.7  CL 97* 97* 94* 95* 96* 94* 95* 94*  CO2 _0 GLUCOSE 144* 131* 104* 112* 107* 113* 121* 135*  BUN 17 24* 31* 43* 33* 39* 30* 42*  CREATININE 3.72* 4.45* 5.69* 6.47* 5.46* 6.67* 5.38* 6.42*  CALCIUM 8.8* 9.1 9.1 8.9 9.0 9.0 9.3 9.3  PHOS 3.1 3.9 4.7* 5.1* 4.3  --   --   --    CBC Recent Labs  Lab 07/12/21 0051 07/15/21 0039 07/16/21 0015 07/17/21 0047  WBC 9.9 8.9 10.3 8.8  HGB 10.4* 10.6* 9.8* 10.4*  HCT 33.0* 35.0* 31.2* 33.1*  MCV 99.4 100.6* 99.7 99.1  PLT 189 187 199 214    Medications:     amiodarone  200 mg Oral Daily   apixaban  5 mg Oral BID   calcium acetate  667 mg Oral TID WC   Chlorhexidine Gluconate Cloth  6 each Topical Daily   Chlorhexidine Gluconate Cloth  6 each Topical Q0600   darbepoetin (ARANESP) injection - DIALYSIS  60 mcg Intravenous Q Mon-HD   DULoxetine  20 mg Oral Daily   feeding supplement (NEPRO CARB STEADY)  237 mL Oral TID BM   insulin aspart  0-15 Units Subcutaneous TID WC   insulin aspart  0-5 Units Subcutaneous QHS   linezolid  600 mg Oral Q12H   mouth rinse  15 mL Mouth Rinse  BID   midodrine  15 mg Oral TID WC   multivitamin  1 tablet Oral QHS   polyethylene glycol  17 g Oral Daily   senna  1 tablet Oral Daily   sodium chloride flush  3 mL Intravenous Q12H      Otelia Santee, MD 07/17/2021, 8:47 AM

## 2021-07-18 DIAGNOSIS — M79661 Pain in right lower leg: Secondary | ICD-10-CM | POA: Diagnosis not present

## 2021-07-18 DIAGNOSIS — R570 Cardiogenic shock: Secondary | ICD-10-CM | POA: Diagnosis not present

## 2021-07-18 DIAGNOSIS — I9589 Other hypotension: Secondary | ICD-10-CM | POA: Diagnosis not present

## 2021-07-18 DIAGNOSIS — L03115 Cellulitis of right lower limb: Secondary | ICD-10-CM | POA: Diagnosis not present

## 2021-07-18 LAB — CBC
HCT: 35.3 % — ABNORMAL LOW (ref 36.0–46.0)
Hemoglobin: 10.8 g/dL — ABNORMAL LOW (ref 12.0–15.0)
MCH: 30.7 pg (ref 26.0–34.0)
MCHC: 30.6 g/dL (ref 30.0–36.0)
MCV: 100.3 fL — ABNORMAL HIGH (ref 80.0–100.0)
Platelets: 230 10*3/uL (ref 150–400)
RBC: 3.52 MIL/uL — ABNORMAL LOW (ref 3.87–5.11)
RDW: 17.3 % — ABNORMAL HIGH (ref 11.5–15.5)
WBC: 8.2 10*3/uL (ref 4.0–10.5)
nRBC: 0.4 % — ABNORMAL HIGH (ref 0.0–0.2)

## 2021-07-18 LAB — PHOSPHORUS: Phosphorus: 3.5 mg/dL (ref 2.5–4.6)

## 2021-07-18 LAB — CULTURE, BLOOD (ROUTINE X 2): Special Requests: ADEQUATE

## 2021-07-18 LAB — COMPREHENSIVE METABOLIC PANEL
ALT: 14 U/L (ref 0–44)
AST: 17 U/L (ref 15–41)
Albumin: 3.1 g/dL — ABNORMAL LOW (ref 3.5–5.0)
Alkaline Phosphatase: 267 U/L — ABNORMAL HIGH (ref 38–126)
Anion gap: 12 (ref 5–15)
BUN: 31 mg/dL — ABNORMAL HIGH (ref 6–20)
CO2: 27 mmol/L (ref 22–32)
Calcium: 9.2 mg/dL (ref 8.9–10.3)
Chloride: 95 mmol/L — ABNORMAL LOW (ref 98–111)
Creatinine, Ser: 5.01 mg/dL — ABNORMAL HIGH (ref 0.44–1.00)
GFR, Estimated: 10 mL/min — ABNORMAL LOW (ref >60)
Glucose, Bld: 129 mg/dL — ABNORMAL HIGH (ref 70–99)
Potassium: 4.6 mmol/L (ref 3.5–5.1)
Sodium: 134 mmol/L — ABNORMAL LOW (ref 135–145)
Total Bilirubin: 0.6 mg/dL (ref 0.3–1.2)
Total Protein: 7 g/dL (ref 6.5–8.1)

## 2021-07-18 LAB — GLUCOSE, CAPILLARY
Glucose-Capillary: 105 mg/dL — ABNORMAL HIGH (ref 70–99)
Glucose-Capillary: 147 mg/dL — ABNORMAL HIGH (ref 70–99)
Glucose-Capillary: 130 mg/dL — ABNORMAL HIGH (ref 70–99)
Glucose-Capillary: 113 mg/dL — ABNORMAL HIGH (ref 70–99)

## 2021-07-18 LAB — MAGNESIUM: Magnesium: 2.4 mg/dL (ref 1.7–2.4)

## 2021-07-18 NOTE — Progress Notes (Signed)
[facility] Progress Note   Outpatient orders: [facility], Monday Wednesday Friday, 4hr 15 minutes.  350 BFR/500 DFR  1K, 2.5 calcium, 138 sodium, EDW 135.5 kg, 15-gauge needles, machine temp 37  Assessment/ Plan:   1) ESRD  - Eventually need to work toward doing HD up in chair. I'm not convinced she will be able to do that at this time; main issue is transfer from bed to chair.   - Tolerated HD 7/27 with net 2.5L UF, 7/29 w/ 3L net   Planning on HD  today  to help with volume.    2) Acute on chronic RV failure with shock, pulmonary HTN -CTA r/o'd PE -TTE with normal EF, mod LVH and severe RV dysfunction -RHC with elevated RA pressure (22), no marked change in pressures or CO with AVF occlusion -Her AVF does not appear to be very high flow.  Will  not ligate it. -on midodrine 15 TID -sildenafil stopped in light of ongoing hypotension UF as able with HD-  using low temp- albumin and profile to get volume off-   Eventually will stop using albumin as they cannnot give as OP but trying to maximize while she's in the hospital to remove excess volume. On max midodrine.    3) Volume/ hypertension: prior EDW 135.5kg. UF with HD.  Over EDW still but making progress, achieved 2.5 liters with last HD-  will continue to push-  lowest  weight recorded at 130-131 kg but she still has a lot of volume onboard   4) Anemia of Chronic Kidney Disease: Hb in 10-11s without ESA here to date. Now dipped under 10-  have added ESA   5) Secondary Hyperparathyroidism/Hyperphosphatemia: sensipar 60 qtx -  phos OK on phoslo - Need to recheck phos (ordered) only on phoslo 1 tab TIDM   6) DM2 with hyperglycemia: mgmt per primary service  Subjective:   Felt tired this am but doing better. Open wound in left inner thigh   Objective:   BP 131/83 (BP Location: Right Wrist)   Pulse 67   Temp 97.7 F (36.5 C) (Oral)   Resp 18   Ht _0  (1.651 m)   Wt (!) 145.3 kg   SpO2 96%   BMI  53.31 kg/m   Intake/Output Summary (Last 24 hours) at 07/18/2021 1100 Last data filed at 07/18/2021 0609 Gross per 24 hour  Intake 300 ml  Output 2957 ml  Net -2657 ml   Weight change: -2.6 kg  Physical Exam: Gen:nad, sitting up in bed CVS:s1s2, rrr Resp: normal wob GKK:DPTEL, soft, nt [id number] bilateral LE's w/ chronic skin changes, wound left inner thigh Neuro: awake, alert, moves all ext spontaneously, speech clear and coherent HD access: lt Cimino +b/t  Imaging: No results found.  Labs: BMET Recent Labs  Lab 07/12/21 0051 07/13/21 0036 07/14/21 0046 07/15/21 0039 07/16/21 0015 07/17/21 0047 07/18/21 0047  NA 133* 131* 135 129* 133* 133* 134*  K 5.1 5.6* 4.6 4.8 4.8 4.7 4.6  CL 94* 95* 96* 94* 95* 94* 95*  CO2 _1 GLUCOSE 104* 112* 107* 113* 121* 135* 129*  BUN 31* 43* 33* 39* 30* 42* 31*  CREATININE 5.69* 6.47* 5.46* 6.67* 5.38* 6.42* 5.01*  CALCIUM 9.1 8.9 9.0 9.0 9.3 9.3 9.2  PHOS 4.7* 5.1* 4.3  --   --   --   --    CBC Recent Labs  Lab 07/15/21 0039 07/16/21 0015 07/17/21 0047 07/18/21 0047  WBC 8.9 10.3 8.8 8.2  HGB 10.6* 9.8* 10.4* 10.8*  HCT 35.0* 31.2* 33.1* 35.3*  MCV 100.6* 99.7 99.1 100.3*  PLT 187 199 214 230    Medications:     amiodarone  200 mg Oral Daily   apixaban  5 mg Oral BID   calcium acetate  667 mg Oral TID WC   Chlorhexidine Gluconate Cloth  6 each Topical Daily   Chlorhexidine Gluconate Cloth  6 each Topical Q0600   darbepoetin (ARANESP) injection - DIALYSIS  60 mcg Intravenous Q Mon-HD   DULoxetine  20 mg Oral Daily   feeding supplement (NEPRO CARB STEADY)  237 mL Oral TID BM   insulin aspart  0-15 Units Subcutaneous TID WC   insulin aspart  0-5 Units Subcutaneous QHS   linezolid  600 mg Oral Q12H   mouth rinse  15 mL Mouth Rinse BID   midodrine  15 mg Oral TID WC   multivitamin  1 tablet Oral QHS   polyethylene glycol  17 g Oral Daily   senna  1 tablet Oral Daily   sodium chloride flush  3  mL Intravenous Q12H      Otelia Santee, MD 07/18/2021, 11:00 AM

## 2021-07-18 NOTE — Progress Notes (Signed)
PROGRESS NOTE  [name]  XLK:[id number] DOB: 1966/07/17 DOA: 06/21/2021 PCP: Noreene Larsson, NP   Brief Narrative: 55 year old female with a history of end-stage renal disease on hemodialysis, has had a prolonged hospitalization since she was admitted on 06/21/2021 for cellulitis and concerns for sepsis.  She was treated with antibiotics, but had continued issues with hypotension.  Upon further evaluation, it was felt that her hypotension was more related to cardiogenic shock from RV failure and pulmonary hypertension group 3.  She is being followed by cardiology, nephrology and PCCM.  She is intermittently required vasopressors for hypotension.  She has been off vasopressors since 7/20 and has tolerated hemodialysis.  Currently on midodrine.  Transferred to [facility] service on 7/23.  Overnight on 7/26 patient became tachycardic and hypotensive concerns for left lower extremity cellulitis/sepsis started on IV daptomycin and cefepime.  CT of the thigh was ordered, patient was seen by wound care.  Infectious disease team consulted, remove midline and start linezolid twice daily. Hemodialysis continues to assist with volume removal.   Assessment & Plan: Principal Problem:   Hypotension Active Problems:   Lymphedema of lower extremity   Morbid obesity (HCC)   Obesity hypoventilation syndrome (HCC)   Cellulitis   ESRD on dialysis ([place])   Staphylococcus epidermidis bacteremia   Elevated troponin   Chronic right heart failure (HCC)   Type 2 diabetes mellitus with hyperglycemia (HCC)   Lymphedema   Cardiogenic shock (HCC)   Bilateral calf pain   Pressure injury of skin   Pulmonary hypertension (HCC)  Left lower extremity 5 cm X 11 cm full-thickness wound in medial and posterior upper thigh: Cellulitis thought to have been ruled out per ID. CT only showed chronic inflammation and lymphedema - Continue BID dressing changes.  - Compression stockings for lymphedema - Continue to have no evidence of  complicating purulence or cellulitis on recheck 7/30.  Infected RUE midline catheter: Has been discontinued.  - Continue linezolid x5 days after midline removal   Cardiogenic shock due to RV failure Pulmonary hypertension Acute on chronic diastolic congestive heart failure - Off vasopressors since 7/20, continuing midodrine.  - Continuing intermittent HD with successful volume removal/optimization. To repeat 7/30. EDW thought to be 135.5kg, remains grossly above that and overloaded. - Pt will need to be able to transfer to HD chair and sit for full treatment prior to discharge.  - Needs advanced heart failure clinic follow up (signed off during this hospitalization)   Atypical atrial flutter: NSR s/p DCCV 7/15.  - Continue amiodarone, eliquis.    ESRD: AVF largely occluded, so ligation not required at this time.  - HD as above per nephrology. Needs to sit up in chair to discharge for outpatient HD. Encouraged frequent PT/OT/mobility per RN.   OSA: Suspected - Continue CPAP qHS - Outpatient sleep study recommended.    Fibromyalgia - Continue duloxetine.   Anemia of chronic kidney disease: Stable without bleeding.  - ESA per nephrology.   T2DM with hyperglycemia: HbA1c 6.9%.  - Continue SSI, very modest requirements.  - Continue carb-limited diet (in addition to heart healthy/renal)   Weakness:  - Continue PT/OT efforts. PT recommends SNF placement   Goals of care discussion: Due to multiple medical comorbidities and acute ongoing issues.  Palliative care team was consulted.  Patient wishes to be full code with full scope of treatment, hopeful for improvement and going to rehab.   RN Pressure Injury Documentation: Pressure Injury 07/04/21 Thigh Posterior;Proximal;Right Stage 2 -  Partial thickness loss of dermis presenting as a shallow open injury with a red, pink wound bed without slough. (Active)  07/04/21 1200  Location: Thigh  Location Orientation: Posterior;Proximal;Right   Staging: Stage 2 -  Partial thickness loss of dermis presenting as a shallow open injury with a red, pink wound bed without slough.  Wound Description (Comments):   Present on Admission: No   Obesity: Estimated body mass index is 53.31 kg/m as calculated from the following:   Height as of this encounter: _0  (1.651 m).   Weight as of this encounter: 145.3 kg.  DVT prophylaxis: Eliquis Code Status: Full Family Communication: None at bedside Disposition Plan:  Status is: Inpatient  Remains inpatient appropriate because:Inpatient level of care appropriate due to severity of illness  Dispo: The patient is from: Home              Anticipated d/c is to: SNF              Patient currently is not medically stable to d/c.   Difficult to place patient No  Consultants:  Nephrology ID  Subjective: Feeling good today. Tolerated 3L net UF yesterday, ready again today. Eager to work with PT, she's doing leg exercises in the bed independently. No fever, chills, or new pain in the leg wound.  Objective: Vitals:   07/18/21 0351 07/18/21 0354 07/18/21 0739 07/18/21 1101  BP: 116/77  131/83 134/80  Pulse: 64  67 62  Resp: _1 Temp: 98 F (36.7 C)  97.7 F (36.5 C) 97.7 F (36.5 C)  TempSrc: Oral  Oral Oral  SpO2: 92%  96% 93%  Weight:  (!) 145.3 kg    Height:        Intake/Output Summary (Last 24 hours) at 07/18/2021 1230 Last data filed at 07/18/2021 0609 Gross per 24 hour  Intake 300 ml  Output 0 ml  Net 300 ml   Filed Weights   07/16/21 0333 07/17/21 0420 07/18/21 0354  Weight: (!) 147.1 kg (!) 147.9 kg (!) 145.3 kg   Gen: 55 y.o. female in no distress Pulm: Nonlabored breathing. Clear. CV: Regular rate and rhythm. No murmur, rub, or gallop. UTD JVD, ++ dependent edema. GI: Abdomen soft, obese but non-tender, non-distended, with normoactive bowel sounds.  Ext: Warm, no deformities Skin: Images on patient's phone of wound on left thigh appear with minimal  surrounding erythema, slough without purulence and healthy wound bed. No new rashes, lesions or ulcers on visualized skin. Neuro: Alert and oriented. No focal neurological deficits. Psych: Judgement and insight appear fair. Mood euthymic & affect congruent. Behavior is appropriate.    Data Reviewed: I have personally reviewed following labs and imaging studies  CBC: Recent Labs  Lab 07/12/21 0051 07/15/21 0039 07/16/21 0015 07/17/21 0047 07/18/21 0047  WBC 9.9 8.9 10.3 8.8 8.2  HGB 10.4* 10.6* 9.8* 10.4* 10.8*  HCT 33.0* 35.0* 31.2* 33.1* 35.3*  MCV 99.4 100.6* 99.7 99.1 100.3*  PLT 189 187 199 214 862   Basic Metabolic Panel: Recent Labs  Lab 07/12/21 0051 07/13/21 0036 07/14/21 0046 07/15/21 0039 07/16/21 0015 07/17/21 0047 07/18/21 0047  NA 133* 131* 135 129* 133* 133* 134*  K 5.1 5.6* 4.6 4.8 4.8 4.7 4.6  CL 94* 95* 96* 94* 95* 94* 95*  CO2 _2 GLUCOSE 104* 112* 107* 113* 121* 135* 129*  BUN 31* 43* 33* 39* 30* 42* 31*  CREATININE 5.69* 6.47* 5.46*  6.67* 5.38* 6.42* 5.01*  CALCIUM 9.1 8.9 9.0 9.0 9.3 9.3 9.2  MG  --   --   --  2.4 2.3 2.5* 2.4  PHOS 4.7* 5.1* 4.3  --   --   --   --    GFR: Estimated Creatinine Clearance: 18.7 mL/min (A) (by C-G formula based on SCr of 5.01 mg/dL (H)). Liver Function Tests: Recent Labs  Lab 07/12/21 0051 07/13/21 0036 07/14/21 0046 07/17/21 0047 07/18/21 0047  AST  --   --   --  16 17  ALT  --   --   --  12 14  ALKPHOS  --   --   --  257* 267*  BILITOT  --   --   --  0.7 0.6  PROT  --   --   --  6.7 7.0  ALBUMIN 3.0* 2.9* 2.9* 3.1* 3.1*   No results for input(s): LIPASE, AMYLASE in the last 168 hours. No results for input(s): AMMONIA in the last 168 hours. Coagulation Profile: Recent Labs  Lab 07/15/21 0420  INR 1.9*   Cardiac Enzymes: Recent Labs  Lab 07/16/21 0015  CKTOTAL 11*   BNP (last 3 results) No results for input(s): PROBNP in the last 8760 hours. HbA1C: No results for input(s):  HGBA1C in the last 72 hours. CBG: Recent Labs  Lab 07/17/21 1357 07/17/21 1701 07/17/21 2124 07/18/21 0613 07/18/21 1134  GLUCAP 141* 165* 126* 105* 147*     Recent Results (from the past 240 hour(s))  Culture, blood (x 2)     Status: None (Preliminary result)   Collection Time: 07/15/21  3:58 AM   Specimen: BLOOD  Result Value Ref Range Status   Specimen Description BLOOD SITE NOT SPECIFIED  Final   Special Requests   Final    BOTTLES DRAWN AEROBIC ONLY Blood Culture adequate volume   Culture   Final    NO GROWTH 1 DAY Performed at [facility], [place] [address] [place], [place] [address]    Report Status PENDING  Incomplete  Culture, blood (x 2)     Status: Abnormal   Collection Time: 07/15/21  3:58 AM   Specimen: BLOOD  Result Value Ref Range Status   Specimen Description BLOOD SITE NOT SPECIFIED  Final   Special Requests   Final    BOTTLES DRAWN AEROBIC ONLY Blood Culture adequate volume   Culture  Setup Time   Final    GRAM POSITIVE COCCI IN CLUSTERS AEROBIC BOTTLE ONLY CRITICAL RESULT CALLED TO, READ BACK BY AND VERIFIED WITH: PHARMD C PIERCE [id number] AT 62 AM BY CM    Culture (A)  Final    STAPHYLOCOCCUS EPIDERMIDIS THE SIGNIFICANCE OF ISOLATING THIS ORGANISM FROM A SINGLE SET OF BLOOD CULTURES WHEN MULTIPLE SETS ARE DRAWN IS UNCERTAIN. PLEASE NOTIFY THE MICROBIOLOGY DEPARTMENT WITHIN ONE WEEK IF SPECIATION AND SENSITIVITIES ARE REQUIRED. Performed at [facility], [place] [address] [place], [place] [address]    Report Status 07/18/2021 FINAL  Final  Blood Culture ID Panel (Reflexed)     Status: Abnormal   Collection Time: 07/15/21  3:58 AM  Result Value Ref Range Status   Enterococcus faecalis NOT DETECTED NOT DETECTED Final   Enterococcus Faecium NOT DETECTED NOT DETECTED Final   Listeria monocytogenes NOT DETECTED NOT DETECTED Final   Staphylococcus species DETECTED (A) NOT DETECTED Final    Comment: CRITICAL RESULT CALLED TO, READ BACK BY AND  VERIFIED WITH: PHARMD C PIERCE [id number] AT  725 AM BY CM    Staphylococcus aureus (BCID) NOT DETECTED NOT DETECTED Final   Staphylococcus epidermidis DETECTED (A) NOT DETECTED Final    Comment: Methicillin (oxacillin) resistant coagulase negative staphylococcus. Possible blood culture contaminant (unless isolated from more than one blood culture draw or clinical case suggests pathogenicity). No antibiotic treatment is indicated for blood  culture contaminants. CRITICAL RESULT CALLED TO, READ BACK BY AND VERIFIED WITH: PHARMD C PIERCE [id number] AT 7 AM BY CM    Staphylococcus lugdunensis NOT DETECTED NOT DETECTED Final   Streptococcus species NOT DETECTED NOT DETECTED Final   Streptococcus agalactiae NOT DETECTED NOT DETECTED Final   Streptococcus pneumoniae NOT DETECTED NOT DETECTED Final   Streptococcus pyogenes NOT DETECTED NOT DETECTED Final   A.calcoaceticus-baumannii NOT DETECTED NOT DETECTED Final   Bacteroides fragilis NOT DETECTED NOT DETECTED Final   Enterobacterales NOT DETECTED NOT DETECTED Final   Enterobacter cloacae complex NOT DETECTED NOT DETECTED Final   Escherichia coli NOT DETECTED NOT DETECTED Final   Klebsiella aerogenes NOT DETECTED NOT DETECTED Final   Klebsiella oxytoca NOT DETECTED NOT DETECTED Final   Klebsiella pneumoniae NOT DETECTED NOT DETECTED Final   Proteus species NOT DETECTED NOT DETECTED Final   Salmonella species NOT DETECTED NOT DETECTED Final   Serratia marcescens NOT DETECTED NOT DETECTED Final   Haemophilus influenzae NOT DETECTED NOT DETECTED Final   Neisseria meningitidis NOT DETECTED NOT DETECTED Final   Pseudomonas aeruginosa NOT DETECTED NOT DETECTED Final   Stenotrophomonas maltophilia NOT DETECTED NOT DETECTED Final   Candida albicans NOT DETECTED NOT DETECTED Final   Candida auris NOT DETECTED NOT DETECTED Final   Candida glabrata NOT DETECTED NOT DETECTED Final   Candida krusei NOT DETECTED NOT DETECTED Final   Candida parapsilosis NOT  DETECTED NOT DETECTED Final   Candida tropicalis NOT DETECTED NOT DETECTED Final   Cryptococcus neoformans/gattii NOT DETECTED NOT DETECTED Final   Methicillin resistance mecA/C DETECTED (A) NOT DETECTED Final    Comment: CRITICAL RESULT CALLED TO, READ BACK BY AND VERIFIED WITH: PHARMD C PIERCE [id number] AT 725 AM BY CM Performed at [facility], [address], [place], [place] [address]   Aerobic Culture w Gram Stain (superficial specimen)     Status: None   Collection Time: 07/15/21  4:30 AM   Specimen: Wound  Result Value Ref Range Status   Specimen Description WOUND  Final   Special Requests POSTERIOR LEFT LEG  Final   Gram Stain   Final    NO WBC SEEN ABUNDANT GRAM POSITIVE COCCI ABUNDANT GRAM NEGATIVE RODS ABUNDANT GRAM POSITIVE RODS    Culture   Final    ABUNDANT MULTIPLE ORGANISMS PRESENT, NONE PREDOMINANT NO STAPHYLOCOCCUS AUREUS ISOLATED Performed at [facility], [place] [address] [place], [place] [address]    Report Status 07/17/2021 FINAL  Final  Aerobic Culture w Gram Stain (superficial specimen)     Status: None   Collection Time: 07/15/21  4:30 AM   Specimen: Wound  Result Value Ref Range Status   Specimen Description WOUND  Final   Special Requests LEFT FRONT LEG  Final   Gram Stain   Final    NO WBC SEEN ABUNDANT GRAM POSITIVE COCCI FEW GRAM POSITIVE RODS FEW GRAM NEGATIVE RODS    Culture   Final    ABUNDANT MULTIPLE ORGANISMS PRESENT, NONE PREDOMINANT NO STAPHYLOCOCCUS AUREUS ISOLATED Performed at [facility], [place] [address] [place],  [address]    Report Status 07/17/2021 FINAL  Final      Radiology Studies: No results found.  Scheduled Meds:  amiodarone  200 mg Oral Daily   apixaban  5 mg Oral BID   calcium acetate  667 mg Oral TID WC   Chlorhexidine Gluconate Cloth  6 each Topical Daily   Chlorhexidine Gluconate Cloth  6 each Topical Q0600   darbepoetin (ARANESP) injection - DIALYSIS  60 mcg Intravenous Q Mon-HD    DULoxetine  20 mg Oral Daily   feeding supplement (NEPRO CARB STEADY)  237 mL Oral TID BM   insulin aspart  0-15 Units Subcutaneous TID WC   insulin aspart  0-5 Units Subcutaneous QHS   linezolid  600 mg Oral Q12H   mouth rinse  15 mL Mouth Rinse BID   midodrine  15 mg Oral TID WC   multivitamin  1 tablet Oral QHS   polyethylene glycol  17 g Oral Daily   senna  1 tablet Oral Daily   sodium chloride flush  3 mL Intravenous Q12H   Continuous Infusions:  sodium chloride     sodium chloride     promethazine (PHENERGAN) injection (IM or IVPB) Stopped (07/02/21 1831)     LOS: 27 days   Time spent: 25 minutes.  Patrecia Pour, MD [facility] [email or web address] 07/18/2021, 12:30 PM

## 2021-07-19 DIAGNOSIS — I9589 Other hypotension: Secondary | ICD-10-CM | POA: Diagnosis not present

## 2021-07-19 DIAGNOSIS — R570 Cardiogenic shock: Secondary | ICD-10-CM | POA: Diagnosis not present

## 2021-07-19 DIAGNOSIS — L03115 Cellulitis of right lower limb: Secondary | ICD-10-CM | POA: Diagnosis not present

## 2021-07-19 DIAGNOSIS — M79661 Pain in right lower leg: Secondary | ICD-10-CM | POA: Diagnosis not present

## 2021-07-19 LAB — MAGNESIUM: Magnesium: 2.5 mg/dL — ABNORMAL HIGH (ref 1.7–2.4)

## 2021-07-19 LAB — COMPREHENSIVE METABOLIC PANEL
ALT: 15 U/L (ref 0–44)
AST: 20 U/L (ref 15–41)
Albumin: 3.5 g/dL (ref 3.5–5.0)
Alkaline Phosphatase: 287 U/L — ABNORMAL HIGH (ref 38–126)
Anion gap: 13 (ref 5–15)
BUN: 42 mg/dL — ABNORMAL HIGH (ref 6–20)
CO2: 25 mmol/L (ref 22–32)
Calcium: 9.7 mg/dL (ref 8.9–10.3)
Chloride: 92 mmol/L — ABNORMAL LOW (ref 98–111)
Creatinine, Ser: 5.94 mg/dL — ABNORMAL HIGH (ref 0.44–1.00)
GFR, Estimated: 8 mL/min — ABNORMAL LOW (ref >60)
Glucose, Bld: 99 mg/dL (ref 70–99)
Potassium: 5.3 mmol/L — ABNORMAL HIGH (ref 3.5–5.1)
Sodium: 130 mmol/L — ABNORMAL LOW (ref 135–145)
Total Bilirubin: 1 mg/dL (ref 0.3–1.2)
Total Protein: 8 g/dL (ref 6.5–8.1)

## 2021-07-19 LAB — CBC
HCT: 37.9 % (ref 36.0–46.0)
Hemoglobin: 11.7 g/dL — ABNORMAL LOW (ref 12.0–15.0)
MCH: 30.5 pg (ref 26.0–34.0)
MCHC: 30.9 g/dL (ref 30.0–36.0)
MCV: 99 fL (ref 80.0–100.0)
Platelets: 237 10*3/uL (ref 150–400)
RBC: 3.83 MIL/uL — ABNORMAL LOW (ref 3.87–5.11)
RDW: 17.5 % — ABNORMAL HIGH (ref 11.5–15.5)
WBC: 8.6 10*3/uL (ref 4.0–10.5)
nRBC: 0.3 % — ABNORMAL HIGH (ref 0.0–0.2)

## 2021-07-19 LAB — GLUCOSE, CAPILLARY
Glucose-Capillary: 87 mg/dL (ref 70–99)
Glucose-Capillary: 102 mg/dL — ABNORMAL HIGH (ref 70–99)
Glucose-Capillary: 92 mg/dL (ref 70–99)
Glucose-Capillary: 142 mg/dL — ABNORMAL HIGH (ref 70–99)
Glucose-Capillary: 120 mg/dL — ABNORMAL HIGH (ref 70–99)

## 2021-07-19 MED ORDER — MIDODRINE HCL 5 MG PO TABS
ORAL_TABLET | ORAL | Status: AC
  Filled 2021-07-19: qty 3

## 2021-07-19 NOTE — Progress Notes (Signed)
[facility] Progress Note   Outpatient orders: [facility], Monday Wednesday Friday, 4hr 15 minutes.  350 BFR/500 DFR  1K, 2.5 calcium, 138 sodium, EDW 135.5 kg, 15-gauge needles, machine temp 37  Assessment/ Plan:   1) ESRD  - Eventually need to work toward doing HD up in chair. I'm not convinced she will be able to do that at this time; main issue is transfer from bed to chair.   - Tolerated HD 7/27 with net 2.5L UF, 7/29 w/ 3L net  - Seen on HD today 2K bath through [facility]  114/68  GOal net UF 3 L which she is tolerating  Planning on HD  tomorrow as well  to help with volume.    2) Acute on chronic RV failure with shock, pulmonary HTN -CTA r/o'd PE -TTE with normal EF, mod LVH and severe RV dysfunction -RHC with elevated RA pressure (22), no marked change in pressures or CO with AVF occlusion -Her AVF does not appear to be very high flow.  Will  not ligate it. -on midodrine 15 TID -sildenafil stopped in light of ongoing hypotension UF as able with HD-  using low temp- albumin and profile to get volume off-   Eventually will stop using albumin as they cannnot give as OP but trying to maximize while she's in the hospital to remove excess volume. On max midodrine.    3) Volume/ hypertension: prior EDW 135.5kg. UF with HD.  Over EDW still but making progress, achieved 2.5 liters with last HD-  will continue to push-  lowest  weight recorded at 130-131 kg but she still has a lot of volume onboard   4) Anemia of Chronic Kidney Disease: Hb in 10-11s without ESA here to date. Now dipped under 10-  have added ESA   5) Secondary Hyperparathyroidism/Hyperphosphatemia: sensipar 60 qtx -  phos OK on phoslo - Need to recheck phos (ordered) only on phoslo 1 tab TIDM   6) DM2 with hyperglycemia: mgmt per primary service  Subjective:   Felt tired this am but doing better. Open wound in left inner thigh   Objective:   BP 124/61   Pulse 62   Temp 97.7 F (36.5  C) (Oral)   Resp 18   Ht _0  (1.651 m)   Wt (!) 147.9 kg   SpO2 99%   BMI 54.26 kg/m  No intake or output data in the 24 hours ending 07/19/21 0929  Weight change: 2 kg  Physical Exam: Gen:nad, sitting up in bed CVS:s1s2, rrr Resp: normal wob TGY:BWLSL, soft, nt HTD:SKAJGOTLXB bilateral LE's w/ chronic skin changes, wound left inner thigh Neuro: awake, alert, moves all ext spontaneously, speech clear and coherent HD access: [facility] +b/t  Imaging: No results found.  Labs: BMET Recent Labs  Lab 07/13/21 0036 07/14/21 0046 07/15/21 0039 07/16/21 0015 07/17/21 0047 07/18/21 0047 07/19/21 0205  NA 131* 135 129* 133* 133* 134* 130*  K 5.6* 4.6 4.8 4.8 4.7 4.6 5.3*  CL 95* 96* 94* 95* 94* 95* 92*  CO2 _1 GLUCOSE 112* 107* 113* 121* 135* 129* 99  BUN 43* 33* 39* 30* 42* 31* 42*  CREATININE 6.47* 5.46* 6.67* 5.38* 6.42* 5.01* 5.94*  CALCIUM 8.9 9.0 9.0 9.3 9.3 9.2 9.7  PHOS 5.1* 4.3  --   --   --  3.5  --    CBC Recent Labs  Lab 07/16/21 0015 07/17/21 0047 07/18/21 0047 07/19/21 0205  WBC 10.3 8.8 8.2 8.6  HGB 9.8* 10.4* 10.8* 11.7*  HCT 31.2* 33.1* 35.3* 37.9  MCV 99.7 99.1 100.3* 99.0  PLT 199 214 230 237    Medications:     amiodarone  200 mg Oral Daily   apixaban  5 mg Oral BID   calcium acetate  667 mg Oral TID WC   Chlorhexidine Gluconate Cloth  6 each Topical Daily   Chlorhexidine Gluconate Cloth  6 each Topical Q0600   darbepoetin (ARANESP) injection - DIALYSIS  60 mcg Intravenous Q Mon-HD   DULoxetine  20 mg Oral Daily   feeding supplement (NEPRO CARB STEADY)  237 mL Oral TID BM   insulin aspart  0-15 Units Subcutaneous TID WC   insulin aspart  0-5 Units Subcutaneous QHS   linezolid  600 mg Oral Q12H   mouth rinse  15 mL Mouth Rinse BID   midodrine  15 mg Oral TID WC   multivitamin  1 tablet Oral QHS   polyethylene glycol  17 g Oral Daily   senna  1 tablet Oral Daily      Otelia Santee, MD 07/19/2021, 9:29 AM

## 2021-07-19 NOTE — Plan of Care (Signed)
  Problem: Education: Goal: Knowledge of General Education information will improve Description: Including pain rating scale, medication(s)/side effects and non-pharmacologic comfort measures Outcome: Progressing

## 2021-07-19 NOTE — Progress Notes (Signed)
PROGRESS NOTE  [name]  XLK:[id number] DOB: 1966/07/17 DOA: 06/21/2021 PCP: Noreene Larsson, NP   Brief Narrative: 55 year old female with a history of end-stage renal disease on hemodialysis, has had a prolonged hospitalization since she was admitted on 06/21/2021 for cellulitis and concerns for sepsis.  She was treated with antibiotics, but had continued issues with hypotension.  Upon further evaluation, it was felt that her hypotension was more related to cardiogenic shock from RV failure and pulmonary hypertension group 3.  She is being followed by cardiology, nephrology and PCCM.  She is intermittently required vasopressors for hypotension.  She has been off vasopressors since 7/20 and has tolerated hemodialysis.  Currently on midodrine.  Transferred to [facility] service on 7/23.  Overnight on 7/26 patient became tachycardic and hypotensive concerns for left lower extremity cellulitis/sepsis started on IV daptomycin and cefepime.  CT of the thigh was ordered, patient was seen by wound care.  Infectious disease team consulted, remove midline and start linezolid twice daily. Hemodialysis continues to assist with volume removal.   Assessment & Plan: Principal Problem:   Hypotension Active Problems:   Lymphedema of lower extremity   Morbid obesity (HCC)   Obesity hypoventilation syndrome (HCC)   Cellulitis   ESRD on dialysis ([place])   Staphylococcus epidermidis bacteremia   Elevated troponin   Chronic right heart failure (HCC)   Type 2 diabetes mellitus with hyperglycemia (HCC)   Lymphedema   Cardiogenic shock (HCC)   Bilateral calf pain   Pressure injury of skin   Pulmonary hypertension (HCC)  Left lower extremity 5 cm X 11 cm full-thickness wound in medial and posterior upper thigh: Cellulitis thought to have been ruled out per ID. CT only showed chronic inflammation and lymphedema - Continue BID dressing changes.  - Compression stockings for lymphedema - Continue to have no evidence of  complicating purulence or cellulitis on recheck 7/30.  Infected RUE midline catheter: Has been discontinued.  - Continue linezolid x5 days after midline removal   Cardiogenic shock due to RV failure Pulmonary hypertension Acute on chronic diastolic congestive heart failure - Off vasopressors since 7/20, continuing midodrine.  - Continuing intermittent HD with successful volume removal/optimization. To repeat 7/30. EDW thought to be 135.5kg, remains grossly above that and overloaded. - Pt will need to be able to transfer to HD chair and sit for full treatment prior to discharge.  - Needs advanced heart failure clinic follow up (signed off during this hospitalization)   Atypical atrial flutter: NSR s/p DCCV 7/15.  - Continue amiodarone, eliquis.    ESRD: AVF largely occluded, so ligation not required at this time.  - HD as above per nephrology. Needs to sit up in chair to discharge for outpatient HD. Encouraged frequent PT/OT/mobility per RN.   OSA: Suspected - Continue CPAP qHS - Outpatient sleep study recommended.    Fibromyalgia - Continue duloxetine.   Anemia of chronic kidney disease: Stable without bleeding.  - ESA per nephrology.   T2DM with hyperglycemia: HbA1c 6.9%.  - Continue SSI, very modest requirements.  - Continue carb-limited diet (in addition to heart healthy/renal)   Weakness:  - Continue PT/OT efforts. PT recommends SNF placement   Goals of care discussion: Due to multiple medical comorbidities and acute ongoing issues.  Palliative care team was consulted.  Patient wishes to be full code with full scope of treatment, hopeful for improvement and going to rehab.   RN Pressure Injury Documentation: Pressure Injury 07/04/21 Thigh Posterior;Proximal;Right Stage 2 -  Partial thickness loss of dermis presenting as a shallow open injury with a red, pink wound bed without slough. (Active)  07/04/21 1200  Location: Thigh  Location Orientation: Posterior;Proximal;Right   Staging: Stage 2 -  Partial thickness loss of dermis presenting as a shallow open injury with a red, pink wound bed without slough.  Wound Description (Comments):   Present on Admission: No   Obesity: Estimated body mass index is 54.26 kg/m as calculated from the following:   Height as of this encounter: _0  (1.651 m).   Weight as of this encounter: 147.9 kg.  DVT prophylaxis: Eliquis Code Status: Full Family Communication: None at bedside Disposition Plan:  Status is: Inpatient  Remains inpatient appropriate because:Inpatient level of care appropriate due to severity of illness  Dispo: The patient is from: Home              Anticipated d/c is to: SNF              Patient currently is not medically stable to d/c.   Difficult to place patient No  Consultants:  Nephrology ID  Subjective: Getting HD today and without complaints. Feels her volume status is improving but she's no where near her baseline. No dyspnea. Did notice a small tremor of the left hand that resolves when it is relaxed.   Objective: Vitals:   07/19/21 0930 07/19/21 1000 07/19/21 1030 07/19/21 1100  BP: 124/69 117/72 130/73 118/73  Pulse: 62 62 61 60  Resp: _1 Temp:      TempSrc:      SpO2:      Weight:      Height:       No intake or output data in the 24 hours ending 07/19/21 1228  Filed Weights   07/18/21 0354 07/19/21 0346 07/19/21 0827  Weight: (!) 145.3 kg (!) 147.3 kg (!) 147.9 kg   Gen: 55 y.o. female in no distress Pulm: Nonlabored breathing O2, clear throughout. CV: Regular rate and rhythm. No murmur, rub, or gallop. No definite JVD, Significant, stable dependent edema. GI: Abdomen soft, non-tender, non-distended, with normoactive bowel sounds.  Ext: Warm, no deformities Skin: No new rashes, lesions or ulcers on visualized skin. Wound not examined today. Neuro: Alert and oriented. No focal neurological deficits. Psych: Judgement and insight appear fair. Mood euthymic &  affect congruent. Behavior is appropriate.    Data Reviewed: I have personally reviewed following labs and imaging studies  CBC: Recent Labs  Lab 07/15/21 0039 07/16/21 0015 07/17/21 0047 07/18/21 0047 07/19/21 0205  WBC 8.9 10.3 8.8 8.2 8.6  HGB 10.6* 9.8* 10.4* 10.8* 11.7*  HCT 35.0* 31.2* 33.1* 35.3* 37.9  MCV 100.6* 99.7 99.1 100.3* 99.0  PLT 187 199 214 230 021   Basic Metabolic Panel: Recent Labs  Lab 07/13/21 0036 07/14/21 0046 07/15/21 0039 07/16/21 0015 07/17/21 0047 07/18/21 0047 07/19/21 0205  NA 131* 135 129* 133* 133* 134* 130*  K 5.6* 4.6 4.8 4.8 4.7 4.6 5.3*  CL 95* 96* 94* 95* 94* 95* 92*  CO2 _2 GLUCOSE 112* 107* 113* 121* 135* 129* 99  BUN 43* 33* 39* 30* 42* 31* 42*  CREATININE 6.47* 5.46* 6.67* 5.38* 6.42* 5.01* 5.94*  CALCIUM 8.9 9.0 9.0 9.3 9.3 9.2 9.7  MG  --   --  2.4 2.3 2.5* 2.4 2.5*  PHOS 5.1* 4.3  --   --   --  3.5  --  GFR: Estimated Creatinine Clearance: 16 mL/min (A) (by C-G formula based on SCr of 5.94 mg/dL (H)). Liver Function Tests: Recent Labs  Lab 07/13/21 0036 07/14/21 0046 07/17/21 0047 07/18/21 0047 07/19/21 0205  AST  --   --  _0 ALT  --   --  _1 ALKPHOS  --   --  257* 267* 287*  BILITOT  --   --  0.7 0.6 1.0  PROT  --   --  6.7 7.0 8.0  ALBUMIN 2.9* 2.9* 3.1* 3.1* 3.5   No results for input(s): LIPASE, AMYLASE in the last 168 hours. No results for input(s): AMMONIA in the last 168 hours. Coagulation Profile: Recent Labs  Lab 07/15/21 0420  INR 1.9*   Cardiac Enzymes: Recent Labs  Lab 07/16/21 0015  CKTOTAL 11*   BNP (last 3 results) No results for input(s): PROBNP in the last 8760 hours. HbA1C: No results for input(s): HGBA1C in the last 72 hours. CBG: Recent Labs  Lab 07/18/21 1134 07/18/21 1721 07/18/21 2126 07/19/21 0623 07/19/21 0758  GLUCAP 147* 130* 113* 87 102*     Recent Results (from the past 240 hour(s))  Culture, blood (x 2)     Status: None  (Preliminary result)   Collection Time: 07/15/21  3:58 AM   Specimen: BLOOD  Result Value Ref Range Status   Specimen Description BLOOD SITE NOT SPECIFIED  Final   Special Requests   Final    BOTTLES DRAWN AEROBIC ONLY Blood Culture adequate volume   Culture   Final    NO GROWTH 3 DAYS Performed at [facility], [place] [address], [place], [place] [address]    Report Status PENDING  Incomplete  Culture, blood (x 2)     Status: Abnormal   Collection Time: 07/15/21  3:58 AM   Specimen: BLOOD  Result Value Ref Range Status   Specimen Description BLOOD SITE NOT SPECIFIED  Final   Special Requests   Final    BOTTLES DRAWN AEROBIC ONLY Blood Culture adequate volume   Culture  Setup Time   Final    GRAM POSITIVE COCCI IN CLUSTERS AEROBIC BOTTLE ONLY CRITICAL RESULT CALLED TO, READ BACK BY AND VERIFIED WITH: PHARMD C PIERCE [id number] AT 63 AM BY CM    Culture (A)  Final    STAPHYLOCOCCUS EPIDERMIDIS THE SIGNIFICANCE OF ISOLATING THIS ORGANISM FROM A SINGLE SET OF BLOOD CULTURES WHEN MULTIPLE SETS ARE DRAWN IS UNCERTAIN. PLEASE NOTIFY THE MICROBIOLOGY DEPARTMENT WITHIN ONE WEEK IF SPECIATION AND SENSITIVITIES ARE REQUIRED. Performed at [facility], [place] [address], [place], [place] [address]    Report Status 07/18/2021 FINAL  Final  Blood Culture ID Panel (Reflexed)     Status: Abnormal   Collection Time: 07/15/21  3:58 AM  Result Value Ref Range Status   Enterococcus faecalis NOT DETECTED NOT DETECTED Final   Enterococcus Faecium NOT DETECTED NOT DETECTED Final   Listeria monocytogenes NOT DETECTED NOT DETECTED Final   Staphylococcus species DETECTED (A) NOT DETECTED Final    Comment: CRITICAL RESULT CALLED TO, READ BACK BY AND VERIFIED WITH: PHARMD C PIERCE [id number] AT 725 AM BY CM    Staphylococcus aureus (BCID) NOT DETECTED NOT DETECTED Final   Staphylococcus epidermidis DETECTED (A) NOT DETECTED Final    Comment: Methicillin (oxacillin) resistant coagulase negative  staphylococcus. Possible blood culture contaminant (unless isolated from more than one blood culture draw or clinical case suggests pathogenicity). No antibiotic treatment is indicated  for blood  culture contaminants. CRITICAL RESULT CALLED TO, READ BACK BY AND VERIFIED WITH: PHARMD C PIERCE [id number] AT 20 AM BY CM    Staphylococcus lugdunensis NOT DETECTED NOT DETECTED Final   Streptococcus species NOT DETECTED NOT DETECTED Final   Streptococcus agalactiae NOT DETECTED NOT DETECTED Final   Streptococcus pneumoniae NOT DETECTED NOT DETECTED Final   Streptococcus pyogenes NOT DETECTED NOT DETECTED Final   A.calcoaceticus-baumannii NOT DETECTED NOT DETECTED Final   Bacteroides fragilis NOT DETECTED NOT DETECTED Final   Enterobacterales NOT DETECTED NOT DETECTED Final   Enterobacter cloacae complex NOT DETECTED NOT DETECTED Final   Escherichia coli NOT DETECTED NOT DETECTED Final   Klebsiella aerogenes NOT DETECTED NOT DETECTED Final   Klebsiella oxytoca NOT DETECTED NOT DETECTED Final   Klebsiella pneumoniae NOT DETECTED NOT DETECTED Final   Proteus species NOT DETECTED NOT DETECTED Final   Salmonella species NOT DETECTED NOT DETECTED Final   Serratia marcescens NOT DETECTED NOT DETECTED Final   Haemophilus influenzae NOT DETECTED NOT DETECTED Final   Neisseria meningitidis NOT DETECTED NOT DETECTED Final   Pseudomonas aeruginosa NOT DETECTED NOT DETECTED Final   Stenotrophomonas maltophilia NOT DETECTED NOT DETECTED Final   Candida albicans NOT DETECTED NOT DETECTED Final   Candida auris NOT DETECTED NOT DETECTED Final   Candida glabrata NOT DETECTED NOT DETECTED Final   Candida krusei NOT DETECTED NOT DETECTED Final   Candida parapsilosis NOT DETECTED NOT DETECTED Final   Candida tropicalis NOT DETECTED NOT DETECTED Final   Cryptococcus neoformans/gattii NOT DETECTED NOT DETECTED Final   Methicillin resistance mecA/C DETECTED (A) NOT DETECTED Final    Comment: CRITICAL RESULT CALLED  TO, READ BACK BY AND VERIFIED WITH: PHARMD C PIERCE [id number] AT 725 AM BY CM Performed at [facility], [address] [place], [place] [address]   Aerobic Culture w Gram Stain (superficial specimen)     Status: None   Collection Time: 07/15/21  4:30 AM   Specimen: Wound  Result Value Ref Range Status   Specimen Description WOUND  Final   Special Requests POSTERIOR LEFT LEG  Final   Gram Stain   Final    NO WBC SEEN ABUNDANT GRAM POSITIVE COCCI ABUNDANT GRAM NEGATIVE RODS ABUNDANT GRAM POSITIVE RODS    Culture   Final    ABUNDANT MULTIPLE ORGANISMS PRESENT, NONE PREDOMINANT NO STAPHYLOCOCCUS AUREUS ISOLATED Performed at [facility], [place] [address] [place], [place] [address]    Report Status 07/17/2021 FINAL  Final  Aerobic Culture w Gram Stain (superficial specimen)     Status: None   Collection Time: 07/15/21  4:30 AM   Specimen: Wound  Result Value Ref Range Status   Specimen Description WOUND  Final   Special Requests LEFT FRONT LEG  Final   Gram Stain   Final    NO WBC SEEN ABUNDANT GRAM POSITIVE COCCI FEW GRAM POSITIVE RODS FEW GRAM NEGATIVE RODS    Culture   Final    ABUNDANT MULTIPLE ORGANISMS PRESENT, NONE PREDOMINANT NO STAPHYLOCOCCUS AUREUS ISOLATED Performed at [facility], [place] [address], [place], [place] [address]    Report Status 07/17/2021 FINAL  Final      Radiology Studies: No results found.  Scheduled Meds:  amiodarone  200 mg Oral Daily   apixaban  5 mg Oral BID   calcium acetate  667 mg Oral TID WC   Chlorhexidine Gluconate Cloth  6 each Topical Daily   Chlorhexidine Gluconate Cloth  6  each Topical Q0600   darbepoetin (ARANESP) injection - DIALYSIS  60 mcg Intravenous Q Mon-HD   DULoxetine  20 mg Oral Daily   feeding supplement (NEPRO CARB STEADY)  237 mL Oral TID BM   insulin aspart  0-15 Units Subcutaneous TID WC   insulin aspart  0-5 Units Subcutaneous QHS   linezolid  600 mg Oral Q12H   mouth rinse  15 mL Mouth Rinse  BID   midodrine  15 mg Oral TID WC   multivitamin  1 tablet Oral QHS   polyethylene glycol  17 g Oral Daily   senna  1 tablet Oral Daily   Continuous Infusions:  sodium chloride     promethazine (PHENERGAN) injection (IM or IVPB) Stopped (07/02/21 1831)     LOS: 28 days   Time spent: 25 minutes.  Patrecia Pour, MD [facility] [email or web address] 07/19/2021, 12:28 PM

## 2021-07-20 DIAGNOSIS — Z20822 Contact with and (suspected) exposure to covid-19: Secondary | ICD-10-CM | POA: Diagnosis not present

## 2021-07-20 DIAGNOSIS — R570 Cardiogenic shock: Secondary | ICD-10-CM | POA: Diagnosis not present

## 2021-07-20 DIAGNOSIS — E861 Hypovolemia: Secondary | ICD-10-CM | POA: Diagnosis not present

## 2021-07-20 DIAGNOSIS — L03115 Cellulitis of right lower limb: Secondary | ICD-10-CM | POA: Diagnosis not present

## 2021-07-20 DIAGNOSIS — I9589 Other hypotension: Secondary | ICD-10-CM | POA: Diagnosis not present

## 2021-07-20 DIAGNOSIS — N186 End stage renal disease: Secondary | ICD-10-CM | POA: Diagnosis not present

## 2021-07-20 DIAGNOSIS — I132 Hypertensive heart and chronic kidney disease with heart failure and with stage 5 chronic kidney disease, or end stage renal disease: Secondary | ICD-10-CM | POA: Diagnosis not present

## 2021-07-20 DIAGNOSIS — M79661 Pain in right lower leg: Secondary | ICD-10-CM | POA: Diagnosis not present

## 2021-07-20 LAB — GLUCOSE, CAPILLARY
Glucose-Capillary: 110 mg/dL — ABNORMAL HIGH (ref 70–99)
Glucose-Capillary: 95 mg/dL (ref 70–99)
Glucose-Capillary: 124 mg/dL — ABNORMAL HIGH (ref 70–99)
Glucose-Capillary: 116 mg/dL — ABNORMAL HIGH (ref 70–99)

## 2021-07-20 LAB — IRON AND TIBC
Iron: 52 ug/dL (ref 28–170)
Saturation Ratios: 17 % (ref 10.4–31.8)
TIBC: 307 ug/dL (ref 250–450)
UIBC: 255 ug/dL

## 2021-07-20 LAB — VITAMIN B12: Vitamin B-12: 1024 pg/mL — ABNORMAL HIGH (ref 180–914)

## 2021-07-20 LAB — FOLATE: Folate: 15.5 ng/mL (ref >5.9)

## 2021-07-20 LAB — RETICULOCYTES
Immature Retic Fract: 18.1 % — ABNORMAL HIGH (ref 2.3–15.9)
RBC.: 3.52 MIL/uL — ABNORMAL LOW (ref 3.87–5.11)
Retic Count, Absolute: 80.5 10*3/uL (ref 19.0–186.0)
Retic Ct Pct: 2.2 % (ref 0.4–3.1)

## 2021-07-20 LAB — CULTURE, BLOOD (ROUTINE X 2)
Culture: NO GROWTH
Special Requests: ADEQUATE

## 2021-07-20 LAB — CBC
HCT: 34.8 % — ABNORMAL LOW (ref 36.0–46.0)
Hemoglobin: 10.6 g/dL — ABNORMAL LOW (ref 12.0–15.0)
MCH: 30.4 pg (ref 26.0–34.0)
MCHC: 30.5 g/dL (ref 30.0–36.0)
MCV: 99.7 fL (ref 80.0–100.0)
Platelets: 228 10*3/uL (ref 150–400)
RBC: 3.49 MIL/uL — ABNORMAL LOW (ref 3.87–5.11)
RDW: 17.5 % — ABNORMAL HIGH (ref 11.5–15.5)
WBC: 7.3 10*3/uL (ref 4.0–10.5)
nRBC: 0.3 % — ABNORMAL HIGH (ref 0.0–0.2)

## 2021-07-20 LAB — FERRITIN: Ferritin: 841 ng/mL — ABNORMAL HIGH (ref 11–307)

## 2021-07-20 MED ORDER — OXYCODONE HCL 5 MG PO TABS: 10.0000 mg | ORAL_TABLET | ORAL | Status: DC | PRN

## 2021-07-20 MED ORDER — SODIUM CHLORIDE 0.9 % IV BOLUS
250.0000 mL | Freq: Once | INTRAVENOUS | Status: AC | PRN
  Administered 2021-07-20: 250 mL via INTRAVENOUS

## 2021-07-20 MED ORDER — OXYCODONE HCL 5 MG PO TABS: 10.0000 mg | ORAL_TABLET | Freq: Once | ORAL | Status: DC

## 2021-07-20 MED ORDER — OXYCODONE HCL 5 MG PO TABS: 5.0000 mg | ORAL_TABLET | Freq: Four times a day (QID) | ORAL | Status: DC | PRN

## 2021-07-20 MED ORDER — DARBEPOETIN ALFA 60 MCG/0.3ML IJ SOSY
60.0000 ug | PREFILLED_SYRINGE | INTRAMUSCULAR | Status: DC
  Filled 2021-07-20 (×3): qty 0.3

## 2021-07-20 MED ORDER — SODIUM CHLORIDE 0.9 % IV BOLUS: 500.0000 mL | Freq: Once | INTRAVENOUS | Status: DC | PRN

## 2021-07-20 MED ORDER — SODIUM CHLORIDE 0.9 % IV BOLUS
250.0000 mL | Freq: Once | INTRAVENOUS | Status: AC | PRN
  Administered 2021-07-21: 250 mL via INTRAVENOUS

## 2021-07-20 NOTE — Progress Notes (Signed)
PROGRESS NOTE  [name]  ZOX:[id number] DOB: [birth year] DOA: 06/21/2021 PCP: Noreene Larsson, NP   Brief Narrative: 55 year old female with a history of end-stage renal disease on hemodialysis, has had a prolonged hospitalization since she was admitted on 06/21/2021 for cellulitis and concerns for sepsis.  She was treated with antibiotics, but had continued issues with hypotension.  Upon further evaluation, it was felt that her hypotension was more related to cardiogenic shock from RV failure and pulmonary hypertension group 3.  She is being followed by cardiology, nephrology and PCCM.  She is intermittently required vasopressors for hypotension.  She has been off vasopressors since 7/20 and has tolerated hemodialysis.  Currently on midodrine.  Transferred to [facility] service on 7/23.  Overnight on 7/26 patient became tachycardic and hypotensive concerns for left lower extremity cellulitis/sepsis started on IV daptomycin and cefepime.  CT of the thigh was ordered, patient was seen by wound care.  Infectious disease team consulted, remove midline and start linezolid twice daily. Hemodialysis continues to assist with volume removal.   Assessment & Plan: Principal Problem:   Hypotension Active Problems:   Lymphedema of lower extremity   Morbid obesity (HCC)   Obesity hypoventilation syndrome (HCC)   Cellulitis   ESRD on dialysis ([place])   Staphylococcus epidermidis bacteremia   Elevated troponin   Chronic right heart failure (HCC)   Type 2 diabetes mellitus with hyperglycemia (HCC)   Lymphedema   Cardiogenic shock (HCC)   Bilateral calf pain   Pressure injury of skin   Pulmonary hypertension (HCC)  Left lower extremity 5 cm X 11 cm full-thickness wound in medial and posterior upper thigh: Cellulitis thought to have been ruled out per ID. CT only showed chronic inflammation and lymphedema - Continue BID dressing changes.  - Compression stockings for lymphedema - Continue to have no evidence of  complicating purulence or cellulitis on recheck 7/30.  Infected RUE midline catheter: Has been discontinued.  - Continue linezolid x5 days after midline removal   Cardiogenic shock due to RV failure Pulmonary hypertension Acute on chronic diastolic congestive heart failure - Off vasopressors since 7/20, continuing midodrine. BPs overall very stable, some soft/asymptomatic. - Continuing intermittent HD with successful volume removal/optimization. 4.5L UF today, planning on proceeding MWF. EDW thought to be 135.5kg, remains grossly above that and overloaded. - Pt will need to be able to transfer to HD chair and sit for full treatment prior to discharge.  - Needs advanced heart failure clinic follow up (signed off during this hospitalization)   Atypical atrial flutter: NSR s/p DCCV 7/15.  - Continue amiodarone, eliquis.    ESRD: AVF largely occluded, so ligation not required at this time.  - HD as above per nephrology. Needs to sit up in chair to discharge for outpatient HD. Encouraged frequent PT/OT/mobility per RN.   OSA: Suspected - Continue CPAP qHS - Outpatient sleep study recommended.    Fibromyalgia - Continue duloxetine.   Anemia of chronic kidney disease: Stable without bleeding.  - ESA per nephrology.   T2DM with hyperglycemia: HbA1c 6.9%.  - Continue SSI, very modest requirements.  - Continue carb-limited diet (in addition to heart healthy/renal)   Weakness:  - Continue PT/OT efforts. PT recommends SNF placement   Goals of care discussion: Due to multiple medical comorbidities and acute ongoing issues.  Palliative care team was consulted.  Patient wishes to be full code with full scope of treatment, hopeful for improvement and going to rehab.   RN Pressure Injury Documentation:  Pressure Injury 07/04/21 Thigh Posterior;Proximal;Right Stage 2 -  Partial thickness loss of dermis presenting as a shallow open injury with a red, pink wound bed without slough. (Active)   07/04/21 1200  Location: Thigh  Location Orientation: Posterior;Proximal;Right  Staging: Stage 2 -  Partial thickness loss of dermis presenting as a shallow open injury with a red, pink wound bed without slough.  Wound Description (Comments):   Present on Admission: No   Obesity: Estimated body mass index is 53.34 kg/m as calculated from the following:   Height as of this encounter: _0  (1.651 m).   Weight as of this encounter: 145.4 kg.  DVT prophylaxis: Eliquis Code Status: Full Family Communication: None at bedside Disposition Plan:  Status is: Inpatient  Remains inpatient appropriate because:Inpatient level of care appropriate due to severity of illness. Needs improvement in volume status with HD which will facilitate mobility which will allow her to transfer and remain in chair for duration of hemodialysis sessions as an outpatient.  Dispo: The patient is from: Home              Anticipated d/c is to: SNF              Patient currently is not medically stable to d/c.   Difficult to place patient No  Consultants:  Nephrology ID  Subjective: No complaints, tolerating HD without issues. No recent or current chest pain or dyspnea. In fact feels her breathing is improved over past day, leg swelling improving which is helping her move them more easily.   Objective: Vitals:   07/20/21 1053 07/20/21 1130 07/20/21 1157 07/20/21 1230  BP: (!) 103/54 (!) 105/55 (!) 102/44 121/67  Pulse: 60 60 (!) 56 62  Resp:      Temp:      TempSrc:      SpO2:      Weight:      Height:       No intake or output data in the 24 hours ending 07/20/21 1301  Filed Weights   07/19/21 1151 07/20/21 0334 07/20/21 0900  Weight: (!) 144.9 kg (!) 146 kg (!) 145.4 kg   Gen: 55 y.o. female in no distress Pulm: Nonlabored breathing supplemental oxygen, clear CV: Regular rate and rhythm. No murmur, rub, or gallop. No JVD, stable significant pitting dependent edema. GI: Abdomen soft, non-tender,  non-distended, with normoactive bowel sounds.  Ext: Warm, no deformities Skin: No new rashes, lesions or ulcers on visualized skin. Dressing c/d/i Neuro: Alert and oriented. No focal neurological deficits. Psych: Judgement and insight appear fair, very pleasant. Mood euthymic & affect congruent. Behavior is appropriate.    Data Reviewed: I have personally reviewed following labs and imaging studies  CBC: Recent Labs  Lab 07/16/21 0015 07/17/21 0047 07/18/21 0047 07/19/21 0205 07/20/21 0031  WBC 10.3 8.8 8.2 8.6 7.3  HGB 9.8* 10.4* 10.8* 11.7* 10.6*  HCT 31.2* 33.1* 35.3* 37.9 34.8*  MCV 99.7 99.1 100.3* 99.0 99.7  PLT 199 214 230 237 213   Basic Metabolic Panel: Recent Labs  Lab 07/14/21 0046 07/15/21 0039 07/16/21 0015 07/17/21 0047 07/18/21 0047 07/19/21 0205  NA 135 129* 133* 133* 134* 130*  K 4.6 4.8 4.8 4.7 4.6 5.3*  CL 96* 94* 95* 94* 95* 92*  CO2 _1 GLUCOSE 107* 113* 121* 135* 129* 99  BUN 33* 39* 30* 42* 31* 42*  CREATININE 5.46* 6.67* 5.38* 6.42* 5.01* 5.94*  CALCIUM 9.0 9.0 9.3 9.3 9.2  9.7  MG  --  2.4 2.3 2.5* 2.4 2.5*  PHOS 4.3  --   --   --  3.5  --    GFR: Estimated Creatinine Clearance: 15.8 mL/min (A) (by C-G formula based on SCr of 5.94 mg/dL (H)). Liver Function Tests: Recent Labs  Lab 07/14/21 0046 07/17/21 0047 07/18/21 0047 07/19/21 0205  AST  --  _0 ALT  --  _1 ALKPHOS  --  257* 267* 287*  BILITOT  --  0.7 0.6 1.0  PROT  --  6.7 7.0 8.0  ALBUMIN 2.9* 3.1* 3.1* 3.5   No results for input(s): LIPASE, AMYLASE in the last 168 hours. No results for input(s): AMMONIA in the last 168 hours. Coagulation Profile: Recent Labs  Lab 07/15/21 0420  INR 1.9*   Cardiac Enzymes: Recent Labs  Lab 07/16/21 0015  CKTOTAL 11*   BNP (last 3 results) No results for input(s): PROBNP in the last 8760 hours. HbA1C: No results for input(s): HGBA1C in the last 72 hours. CBG: Recent Labs  Lab 07/19/21 0758  07/19/21 1334 07/19/21 1643 07/19/21 2125 07/20/21 0627  GLUCAP 102* 92 142* 120* 110*     Recent Results (from the past 240 hour(s))  Culture, blood (x 2)     Status: None   Collection Time: 07/15/21  3:58 AM   Specimen: BLOOD  Result Value Ref Range Status   Specimen Description BLOOD SITE NOT SPECIFIED  Final   Special Requests   Final    BOTTLES DRAWN AEROBIC ONLY Blood Culture adequate volume   Culture   Final    NO GROWTH 5 DAYS Performed at [facility], [place] [address], [place], [place] [address]    Report Status 07/20/2021 FINAL  Final  Culture, blood (x 2)     Status: Abnormal   Collection Time: 07/15/21  3:58 AM   Specimen: BLOOD  Result Value Ref Range Status   Specimen Description BLOOD SITE NOT SPECIFIED  Final   Special Requests   Final    BOTTLES DRAWN AEROBIC ONLY Blood Culture adequate volume   Culture  Setup Time   Final    GRAM POSITIVE COCCI IN CLUSTERS AEROBIC BOTTLE ONLY CRITICAL RESULT CALLED TO, READ BACK BY AND VERIFIED WITH: PHARMD C PIERCE [id number] AT 58 AM BY CM    Culture (A)  Final    STAPHYLOCOCCUS EPIDERMIDIS THE SIGNIFICANCE OF ISOLATING THIS ORGANISM FROM A SINGLE SET OF BLOOD CULTURES WHEN MULTIPLE SETS ARE DRAWN IS UNCERTAIN. PLEASE NOTIFY THE MICROBIOLOGY DEPARTMENT WITHIN ONE WEEK IF SPECIATION AND SENSITIVITIES ARE REQUIRED. Performed at [facility], [place] [address] [place], [place] [address]    Report Status 07/18/2021 FINAL  Final  Blood Culture ID Panel (Reflexed)     Status: Abnormal   Collection Time: 07/15/21  3:58 AM  Result Value Ref Range Status   Enterococcus faecalis NOT DETECTED NOT DETECTED Final   Enterococcus Faecium NOT DETECTED NOT DETECTED Final   Listeria monocytogenes NOT DETECTED NOT DETECTED Final   Staphylococcus species DETECTED (A) NOT DETECTED Final    Comment: CRITICAL RESULT CALLED TO, READ BACK BY AND VERIFIED WITH: PHARMD C PIERCE [id number] AT 725 AM BY CM    Staphylococcus aureus (BCID)  NOT DETECTED NOT DETECTED Final   Staphylococcus epidermidis DETECTED (A) NOT DETECTED Final    Comment: Methicillin (oxacillin) resistant coagulase negative staphylococcus. Possible blood culture contaminant (unless isolated from more than one blood culture draw  or clinical case suggests pathogenicity). No antibiotic treatment is indicated for blood  culture contaminants. CRITICAL RESULT CALLED TO, READ BACK BY AND VERIFIED WITH: PHARMD C PIERCE [id number] AT 14 AM BY CM    Staphylococcus lugdunensis NOT DETECTED NOT DETECTED Final   Streptococcus species NOT DETECTED NOT DETECTED Final   Streptococcus agalactiae NOT DETECTED NOT DETECTED Final   Streptococcus pneumoniae NOT DETECTED NOT DETECTED Final   Streptococcus pyogenes NOT DETECTED NOT DETECTED Final   A.calcoaceticus-baumannii NOT DETECTED NOT DETECTED Final   Bacteroides fragilis NOT DETECTED NOT DETECTED Final   Enterobacterales NOT DETECTED NOT DETECTED Final   Enterobacter cloacae complex NOT DETECTED NOT DETECTED Final   Escherichia coli NOT DETECTED NOT DETECTED Final   Klebsiella aerogenes NOT DETECTED NOT DETECTED Final   Klebsiella oxytoca NOT DETECTED NOT DETECTED Final   Klebsiella pneumoniae NOT DETECTED NOT DETECTED Final   Proteus species NOT DETECTED NOT DETECTED Final   Salmonella species NOT DETECTED NOT DETECTED Final   Serratia marcescens NOT DETECTED NOT DETECTED Final   Haemophilus influenzae NOT DETECTED NOT DETECTED Final   Neisseria meningitidis NOT DETECTED NOT DETECTED Final   Pseudomonas aeruginosa NOT DETECTED NOT DETECTED Final   Stenotrophomonas maltophilia NOT DETECTED NOT DETECTED Final   Candida albicans NOT DETECTED NOT DETECTED Final   Candida auris NOT DETECTED NOT DETECTED Final   Candida glabrata NOT DETECTED NOT DETECTED Final   Candida krusei NOT DETECTED NOT DETECTED Final   Candida parapsilosis NOT DETECTED NOT DETECTED Final   Candida tropicalis NOT DETECTED NOT DETECTED Final    Cryptococcus neoformans/gattii NOT DETECTED NOT DETECTED Final   Methicillin resistance mecA/C DETECTED (A) NOT DETECTED Final    Comment: CRITICAL RESULT CALLED TO, READ BACK BY AND VERIFIED WITH: PHARMD C PIERCE [id number] AT 725 AM BY CM Performed at [facility], [address], [place], [place] [address]   Aerobic Culture w Gram Stain (superficial specimen)     Status: None   Collection Time: 07/15/21  4:30 AM   Specimen: Wound  Result Value Ref Range Status   Specimen Description WOUND  Final   Special Requests POSTERIOR LEFT LEG  Final   Gram Stain   Final    NO WBC SEEN ABUNDANT GRAM POSITIVE COCCI ABUNDANT GRAM NEGATIVE RODS ABUNDANT GRAM POSITIVE RODS    Culture   Final    ABUNDANT MULTIPLE ORGANISMS PRESENT, NONE PREDOMINANT NO STAPHYLOCOCCUS AUREUS ISOLATED Performed at [facility], [place] [address], [place], [place] [address]    Report Status 07/17/2021 FINAL  Final  Aerobic Culture w Gram Stain (superficial specimen)     Status: None   Collection Time: 07/15/21  4:30 AM   Specimen: Wound  Result Value Ref Range Status   Specimen Description WOUND  Final   Special Requests LEFT FRONT LEG  Final   Gram Stain   Final    NO WBC SEEN ABUNDANT GRAM POSITIVE COCCI FEW GRAM POSITIVE RODS FEW GRAM NEGATIVE RODS    Culture   Final    ABUNDANT MULTIPLE ORGANISMS PRESENT, NONE PREDOMINANT NO STAPHYLOCOCCUS AUREUS ISOLATED Performed at [facility], [place] [address] [place], [place] [address]    Report Status 07/17/2021 FINAL  Final      Radiology Studies: No results found.  Scheduled Meds:  amiodarone  200 mg Oral Daily   apixaban  5 mg Oral BID   calcium acetate  667 mg Oral TID WC   Chlorhexidine Gluconate Cloth  6  each Topical Daily   Chlorhexidine Gluconate Cloth  6 each Topical Q0600   darbepoetin (ARANESP) injection - DIALYSIS  60 mcg Intravenous Q Mon-HD   DULoxetine  20 mg Oral Daily   feeding supplement (NEPRO CARB STEADY)  237 mL Oral TID  BM   insulin aspart  0-15 Units Subcutaneous TID WC   insulin aspart  0-5 Units Subcutaneous QHS   linezolid  600 mg Oral Q12H   mouth rinse  15 mL Mouth Rinse BID   midodrine  15 mg Oral TID WC   multivitamin  1 tablet Oral QHS   polyethylene glycol  17 g Oral Daily   senna  1 tablet Oral Daily   Continuous Infusions:  sodium chloride     promethazine (PHENERGAN) injection (IM or IVPB) Stopped (07/02/21 1831)     LOS: 29 days   Time spent: 25 minutes.  Patrecia Pour, MD [facility] [email or web address] 07/20/2021, 1:01 PM

## 2021-07-20 NOTE — Consult Note (Signed)
NAME:  [name], MRN:  [id number], DOB:  [birth year], LOS: 56 ADMISSION DATE:  06/21/2021, CONSULTATION DATE:  07/20/21 REFERRING MD:  Marlowe Sax - TRH, CHIEF COMPLAINT:  hypotension   History of Present Illness:  55 year old with prolonged hospitalization, initially prolonged critical illness we are consulted for evaluation of hypotension.  Multiple prior notes reviewed.  Reviewed patient vital signs.  Heart rate in 60s to 70s prior to dialysis.  Notably, blood pressures have been a little bit on the softer side compared to her baseline, systolics 937T to 1 teens.  Had 4 L ultrafiltrate at dialysis.  Returned tachycardic to the 1 teens.  Blood pressures have been borderline to low since returning in the afternoon.  MAP dropped to around 60 with systolic in the 02I late in the evening.  This prompted PCCM evaluation.  Notably, her course has been complicated by various causes of shock.  Has wounds as possible source.  Cardiogenic shock treated in the ICU.  Right heart cath performed this admission with elevated RA pressures, cardiac output and index relatively preserved.  Pertinent  Medical History  ESRD on HD, chronic wounds, OSA, pulmonary hypertension  Significant Hospital Events: Including procedures, antibiotic start and stop dates in addition to other pertinent events   Near 30-day admission, please see various consult and progress notes for updates  Interim History / Subjective:    Objective   Blood pressure 100/61, pulse 76, temperature 99.2 F (37.3 C), temperature source Axillary, resp. rate 18, height _0  (1.651 m), weight (!) 141.4 kg, SpO2 100 %.        Intake/Output Summary (Last 24 hours) at 07/20/2021 2348 Last data filed at 07/20/2021 2342 Gross per 24 hour  Intake 251.27 ml  Output 4000 ml  Net -3748.73 ml   Filed Weights   07/20/21 0334 07/20/21 0900 07/20/21 1313  Weight: (!) 146 kg (!) 145.4 kg (!) 141.4 kg    Examination: General: CPAP on, in NAD Eyes:  EOMI, [name] icterus Neck: Supple, [name] JVP appreciated Lungs: Clear, normal work of breathing, using CPAP as at night and trying to sleep Cardiovascular: Tachycardic, [name] murmur, warm Abdomen: Nondistended, bowel sounds present Extremities: wrapped, warm Neuro: normal mentation, follows commands  Resolved Hospital Problem list     Assessment & Plan:  Hypotension: suspect hypovolemic as HR and BP changes immediately following 4L UF with HD. S/p 250 cc NS bolus with improved HR, BP better but still borderline. --additional 250 cc NS bolus for total 500 cc bolus --fu/ CBC  Pulmonary HTN: Mean PA 55, PVR 6 via thermodilution with upper limit normal PCWP and marled;y elevated RA pressure at 22. Suspect group 3 (OHS/OSA) and group 5 (ESRD on HD). Failed PDE5 due to hypotension. Poor candidate for pulmonary vasodilators. --small fluid bolus for hypotension --Agree with ongoing efforts to remove fluid with iHD  OSA:  --continue nocturnal CPAP  Best Practice (right click and "Reselect all SmartList Selections" daily)   Per primary  Labs   CBC: Recent Labs  Lab 07/16/21 0015 07/17/21 0047 07/18/21 0047 07/19/21 0205 07/20/21 0031  WBC 10.3 8.8 8.2 8.6 7.3  HGB 9.8* 10.4* 10.8* 11.7* 10.6*  HCT 31.2* 33.1* 35.3* 37.9 34.8*  MCV 99.7 99.1 100.3* 99.0 99.7  PLT 199 214 230 237 097    Basic Metabolic Panel: Recent Labs  Lab 07/14/21 0046 07/15/21 0039 07/16/21 0015 07/17/21 0047 07/18/21 0047 07/19/21 0205  NA 135 129* 133* 133* 134* 130*  K 4.6 4.8 4.8 4.7  4.6 5.3*  CL 96* 94* 95* 94* 95* 92*  CO2 _0 GLUCOSE 107* 113* 121* 135* 129* 99  BUN 33* 39* 30* 42* 31* 42*  CREATININE 5.46* 6.67* 5.38* 6.42* 5.01* 5.94*  CALCIUM 9.0 9.0 9.3 9.3 9.2 9.7  MG  --  2.4 2.3 2.5* 2.4 2.5*  PHOS 4.3  --   --   --  3.5  --    GFR: Estimated Creatinine Clearance: 15.5 mL/min (A) (by C-G formula based on SCr of 5.94 mg/dL (H)). Recent Labs  Lab 07/15/21 0420  07/15/21 0615 07/16/21 0015 07/17/21 0047 07/18/21 0047 07/19/21 0205 07/20/21 0031  PROCALCITON 1.68  --   --   --   --   --   --   WBC  --   --    < > 8.8 8.2 8.6 7.3  LATICACIDVEN 1.5 1.6  --   --   --   --   --    < > = values in this interval not displayed.    Liver Function Tests: Recent Labs  Lab 07/14/21 0046 07/17/21 0047 07/18/21 0047 07/19/21 0205  AST  --  _1 ALT  --  _2 ALKPHOS  --  257* 267* 287*  BILITOT  --  0.7 0.6 1.0  PROT  --  6.7 7.0 8.0  ALBUMIN 2.9* 3.1* 3.1* 3.5   [name] results for input(s): LIPASE, AMYLASE in the last 168 hours. [name] results for input(s): AMMONIA in the last 168 hours.  ABG    Component Value Date/Time   PHART 7.299 (L) 06/22/2021 1414   PCO2ART 47.5 06/22/2021 1414   PO2ART 89 06/22/2021 1414   HCO3 28.8 (H) 07/02/2021 1321   TCO2 31 07/02/2021 1321   ACIDBASEDEF 3.0 (H) 06/22/2021 1414   O2SAT 53.0 07/02/2021 1321     Coagulation Profile: Recent Labs  Lab 07/15/21 0420  INR 1.9*    Cardiac Enzymes: Recent Labs  Lab 07/16/21 0015  CKTOTAL 11*    HbA1C: Hgb A1c MFr Bld  Date/Time Value Ref Range Status  06/22/2021 04:54 AM 6.9 (H) 4.8 - 5.6 % Final    Comment:    (NOTE) Pre diabetes:          5.7%-6.4%  Diabetes:              >6.4%  Glycemic control for   <7.0% adults with diabetes   06/30/2020 03:28 AM 7.2 (H) 4.8 - 5.6 % Final    Comment:    (NOTE) Pre diabetes:          5.7%-6.4%  Diabetes:              >6.4%  Glycemic control for   <7.0% adults with diabetes     CBG: Recent Labs  Lab 07/19/21 2125 07/20/21 0627 07/20/21 1424 07/20/21 1754 07/20/21 2104  GLUCAP 120* 110* 95 124* 116*    Review of Systems:   [name] DOE. [name] orthopnea. [name] chest pain. Comprehensive ROS otherwise negative.   Past Medical History:  She,  has a past medical history of Allergy, Anemia, unspecified, Anxiety, Arthritis, Asthma, Cancer ([place]), Cellulitis, CHF (congestive heart failure) ([place]), Chiari  malformation, Chiari malformation, Chronic kidney disease, Chronic kidney disease, stage 5 ([place]), Chronic obstructive pulmonary disease, unspecified ([place]), Chronic pain, Complication of anesthesia (11/28/15), Compression of brain ([place]), Constipation, COPD (chronic obstructive pulmonary disease) ([place]), Coronary artery disease, Diabetes mellitus, Diabetes mellitus without complication ([place]), Essential (  primary) hypertension, Fibromyalgia, Fibromyalgia, History of blood transfusion, echocardiogram (10/2011), Hypercholesterolemia, Hyperlipidemia, unspecified, Hypertension, Hypertensive chronic kidney disease with stage 1 through stage 4 chronic kidney disease, or unspecified chronic kidney disease, Hypertensive chronic kidney disease with stage 5 chronic kidney disease or end stage renal disease ([place]), Lymph edema, Lymphedema, not elsewhere classified, Morbid (severe) obesity due to excess calories ([place]), Obesity hypoventilation syndrome ([place]), On home oxygen therapy, Peritonitis, unspecified ([place]), Pneumonia, Pulmonary hypertension ([place]), Renal disorder, S/P colonoscopy (05/26/2007), S/P endoscopy (05/01/2009), Shortness of breath dyspnea, Sleep apnea, Sleep apnea, unspecified, Type 2 diabetes mellitus with diabetic nephropathy ([place]), and Type 2 diabetes mellitus with hyperglycemia ([place]).   Surgical History:   Past Surgical History:  Procedure Laterality Date   .Hemodialysis catheter Right 11/28/2015   A/V FISTULAGRAM N/A 07/26/2017   Procedure: A/V Fistulagram - Left Arm;  Surgeon: Serafina Mitchell, MD;  Location: [facility];  Service: Cardiovascular;  Laterality: N/A;   ABDOMINAL HYSTERECTOMY     total hysterectomy   ADENOIDECTOMY     AV FISTULA PLACEMENT Left 03/15/2016   Procedure: CREATION OF LEFT ARM ARTERIOVENOUS (AV) FISTULA  ;  Surgeon: Rosetta Posner, MD;  Location: [facility];  Service: Vascular;  Laterality: Left;   CAPD INSERTION N/A 08/30/2017   Procedure: LAPAROSCOPIC INSERTION CONTINUOUS  AMBULATORY PERITONEAL DIALYSIS  (CAPD) CATHETER;  Surgeon: Clovis Riley, MD;  Location: [place];  Service: General;  Laterality: N/A;   CARDIOVERSION N/A 07/03/2021   Procedure: CARDIOVERSION;  Surgeon: Larey Dresser, MD;  Location: [facility];  Service: Cardiovascular;  Laterality: N/A;   CESAREAN SECTION      x 2   CESAREAN SECTION N/A    Phreesia 11/04/2020   CRANIECTOMY SUBOCCIPITAL W/ CERVICAL LAMINECTOMY / CHIARI     FISTULA SUPERFICIALIZATION Left 05/10/2016   Procedure: Left Arm FISTULA SUPERFICIALIZATION;  Surgeon: Rosetta Posner, MD;  Location: [place];  Service: Vascular;  Laterality: Left;   IR GENERIC HISTORICAL  08/14/2016   IR REMOVAL TUN ACCESS W/ PORT W/O FL MOD SED 08/14/2016 Arne Cleveland, MD [facility]   IR SINUS/FIST TUBE CHK-NON GI  01/01/2019   IR US GUIDE BX ASP/DRAIN  12/28/2018   MINOR REMOVAL OF PERITONEAL DIALYSIS CATHETER N/A 04/26/2019   Procedure: REMOVAL OF INFECTED PERITONEAL DIALYSIS CATHETER;  Surgeon: Erroll Luna, MD;  Location: [place];  Service: General;  Laterality: N/A;   PERIPHERAL VASCULAR BALLOON ANGIOPLASTY Left 10/18/2019   Procedure: PERIPHERAL VASCULAR BALLOON ANGIOPLASTY;  Surgeon: Marty Heck, MD;  Location: [place] [facility];  Service: Cardiovascular;  Laterality: Left;  arm fistula   PERIPHERAL VASCULAR CATHETERIZATION Left 11/25/2016   Procedure: A/V Fistulagram;  Surgeon: Conrad Ridgeway, MD;  Location: [place] [facility];  Service: Cardiovascular;  Laterality: Left;  arm   PORTACATH PLACEMENT  07/05/2012   Procedure: INSERTION PORT-A-CATH;  Surgeon: Donato Heinz, MD;  Location: [facility];  Service: General;  Laterality: Left;  subclavian   portacath removal     RIGHT HEART CATH N/A 08/04/2017   Procedure: RIGHT HEART CATH;  Surgeon: Jolaine Artist, MD;  Location: [place] [facility] [facility];  Service: Cardiovascular;  Laterality: N/A;   RIGHT HEART CATH N/A 07/02/2021   Procedure: RIGHT HEART CATH;  Surgeon: Larey Dresser, MD;   Location: [place] [facility];  Service: Cardiovascular;  Laterality: N/A;   TEE WITHOUT CARDIOVERSION N/A 07/03/2021   Procedure: TRANSESOPHAGEAL ECHOCARDIOGRAM (TEE);  Surgeon: Larey Dresser, MD;  Location: [facility] ENDOSCOPY;  Service:  Cardiovascular;  Laterality: N/A;   THYROIDECTOMY, PARTIAL     TONSILLECTOMY       Social History:   reports that she has never smoked. She has never used smokeless tobacco. She reports that she does not drink alcohol and does not use drugs.   Family History:  Her family history includes Colon cancer (age of onset: 61) in her mother; Coronary artery disease in her mother; Stroke (age of onset: 63) in her mother.   Allergies Allergies  Allergen Reactions   Contrast Media [Iodinated Diagnostic Agents] Anaphylaxis, Hives, Swelling and Other (See Comments)    Dye for cardiac cath. Tongue swells   Pneumococcal Vaccines Swelling and Other (See Comments)    Turns skin black, and bodily swelling   Vancomycin Nausea And Vomiting and Other (See Comments)    Infusion "made me feel like I was dying" had to be readmitted to hospital     Home Medications  Prior to Admission medications   Medication Sig Start Date End Date Taking? Authorizing Provider  acetaminophen (TYLENOL) 325 MG tablet Take 2 tablets (650 mg total) by mouth every 4 (four) hours as needed for mild pain (or Fever >/= 101). 07/04/20  Yes Emokpae, Courage, MD  calcium acetate (PHOSLO) 667 MG capsule Take 1,334 mg by mouth 3 (three) times daily. 05/27/21  Yes [provider]  calcium carbonate (TUMS - DOSED IN MG ELEMENTAL CALCIUM) 500 MG chewable tablet Chew 2 tablets by mouth 2 (two) times daily.   Yes [provider]  cephALEXin (KEFLEX) 500 MG capsule Take 1 capsule (500 mg total) by mouth 2 (two) times daily. Try to take this every 12 hours. On dialysis days, take the first dose after dialysis and the second at bedtime. 06/21/21  Yes Tat, Shanon Brow, MD  ibuprofen (ADVIL) 200 MG tablet Take  400 mg by mouth every 6 (six) hours as needed for moderate pain.   Yes [provider]  lanthanum (FOSRENOL) 500 MG chewable tablet Chew 1,000-1,500 mg by mouth 5 (five) times daily. 1500 mg by mouth three times a day with meals and 1000 mg by mouth twice a day with snacks   Yes [provider]  loperamide (IMODIUM) 2 MG capsule Take 2-4 mg by mouth as needed for diarrhea or loose stools.    Yes [provider]  multivitamin (RENA-VIT) TABS tablet Take 1 tablet by mouth daily. 05/30/18  Yes [provider]  Oxycodone HCl 10 MG TABS Take 1 tablet (10 mg total) by mouth 4 (four) times daily as needed (pain). 06/21/21  Yes Tat, Shanon Brow, MD  Sennosides (EX-LAX PO) Take 2 tablets by mouth daily as needed (constipation).   Yes [provider]  clindamycin (CLEOCIN) 300 MG capsule Take 1 capsule (300 mg total) by mouth 3 (three) times daily. 06/21/21   Orson Eva, MD  lactulose (CHRONULAC) 10 GM/15ML solution Take 45 mLs (30 g total) by mouth 2 (two) times daily. For constipation Patient not taking: Reported on 06/21/2021 04/30/19   Rai, Vernelle Emerald, MD  insulin lispro (HUMALOG) 100 UNIT/ML injection Inject 60 Units into the skin 2 (two) times daily.    03/08/12  [provider]     Critical care time:     CRITICAL CARE Performed by: Lanier Clam   Total critical care time: 32 minutes  Critical care time was exclusive of separately billable procedures and treating other patients.  Critical care was necessary to treat or prevent imminent or life-threatening deterioration.  Critical  care was time spent personally by me on the following activities: development of treatment plan with patient and/or surrogate as well as nursing, discussions with consultants, evaluation of patient's response to treatment, examination of patient, obtaining history from patient or surrogate, ordering and performing treatments and interventions, ordering and review of  laboratory studies, ordering and review of radiographic studies, pulse oximetry and re-evaluation of patient's condition.

## 2021-07-20 NOTE — Progress Notes (Signed)
[facility] Progress Note   Outpatient orders: [facility], Monday Wednesday Friday, 4hr 15 minutes.  350 BFR/500 DFR  1K, 2.5 calcium, 138 sodium, EDW 135.5 kg, 15-gauge needles, machine temp 37  Assessment/ Plan:   1) ESRD  - Eventually need to work toward doing HD up in chair.  Not able to at this time due to deconditioning  -Plan for dialysis today and then maintain MWF schedule.  Trying to remove fluid as able    2) Acute on chronic RV failure with shock, pulmonary HTN -CTA r/o'd PE -TTE with normal EF, mod LVH and severe RV dysfunction -RHC with elevated RA pressure (22), no marked change in pressures or CO with AVF occlusion -Her AVF does not appear to be very high flow.  Will  not ligate it. -on midodrine 15 TID -sildenafil stopped in light of ongoing hypotension UF as able with HD-  using low temp- albumin and profile to get volume off-   Eventually will stop using albumin as they cannnot give as OP but trying to maximize while she's in the hospital to remove excess volume. On max midodrine.    3) Volume/ hypertension: prior EDW 135.5kg. UF with HD.  Over EDW still but making progress -  will continue to push-  lowest  weight recorded at 130-131 kg but she still has a lot of volume onboard   4) Anemia of Chronic Kidney Disease: Continue ESA as ordered   5) Secondary Hyperparathyroidism/Hyperphosphatemia: sensipar 60 qtx -  phos OK on phoslo - Need to recheck phos (ordered) only on phoslo 1 tab TIDM   6) DM2 with hyperglycemia: mgmt per primary service  Subjective:   Feels well today without significant complaints.  Planning for dialysis today   Objective:   BP (!) 103/54   Pulse 60   Temp 97.9 F (36.6 C) (Oral)   Resp 18   Ht _0  (1.651 m)   Wt (!) 145.4 kg   SpO2 100%   BMI 53.34 kg/m   Intake/Output Summary (Last 24 hours) at 07/20/2021 1127 Last data filed at 07/19/2021 1151 Gross per 24 hour  Intake --  Output 3005 ml  Net -3005  ml    Weight change: 0.6 kg  Physical Exam: Gen:nad, sitting up in bed CVS:s1s2, rrr Resp: normal wob BCW:UGQBV, soft, nt QXI:HWTUUEKCMK bilateral LE's w/ chronic skin changes, wound left inner thigh Neuro: awake, alert, moves all ext spontaneously, speech clear and coherent HD access: lt [place] +b/t  Imaging: No results found.  Labs: BMET Recent Labs  Lab 07/14/21 0046 07/15/21 0039 07/16/21 0015 07/17/21 0047 07/18/21 0047 07/19/21 0205  NA 135 129* 133* 133* 134* 130*  K 4.6 4.8 4.8 4.7 4.6 5.3*  CL 96* 94* 95* 94* 95* 92*  CO2 _1 GLUCOSE 107* 113* 121* 135* 129* 99  BUN 33* 39* 30* 42* 31* 42*  CREATININE 5.46* 6.67* 5.38* 6.42* 5.01* 5.94*  CALCIUM 9.0 9.0 9.3 9.3 9.2 9.7  PHOS 4.3  --   --   --  3.5  --    CBC Recent Labs  Lab 07/17/21 0047 07/18/21 0047 07/19/21 0205 07/20/21 0031  WBC 8.8 8.2 8.6 7.3  HGB 10.4* 10.8* 11.7* 10.6*  HCT 33.1* 35.3* 37.9 34.8*  MCV 99.1 100.3* 99.0 99.7  PLT 214 230 237 228    Medications:     amiodarone  200 mg Oral Daily   apixaban  5 mg Oral BID  calcium acetate  667 mg Oral TID WC   Chlorhexidine Gluconate Cloth  6 each Topical Daily   Chlorhexidine Gluconate Cloth  6 each Topical Q0600   darbepoetin (ARANESP) injection - DIALYSIS  60 mcg Intravenous Q Mon-HD   DULoxetine  20 mg Oral Daily   feeding supplement (NEPRO CARB STEADY)  237 mL Oral TID BM   insulin aspart  0-15 Units Subcutaneous TID WC   insulin aspart  0-5 Units Subcutaneous QHS   linezolid  600 mg Oral Q12H   mouth rinse  15 mL Mouth Rinse BID   midodrine  15 mg Oral TID WC   multivitamin  1 tablet Oral QHS   polyethylene glycol  17 g Oral Daily   senna  1 tablet Oral Daily      Reesa Chew  07/20/2021, 11:27 AM

## 2021-07-20 NOTE — Progress Notes (Signed)
Overnight event  Informed by RN that patient has been hypotensive since dialysis today with systolic in the 14J to 09K.  Most recent blood pressure 70/50 manual, patient is diaphoretic but otherwise no other symptoms.  She is tachycardic with heart rate in the 110s  Temperature 98.1 F.  Satting 97% on 2 L supplemental oxygen, no respiratory distress.  -Already on midodrine 15 mg 3 times daily -Receiving oxycodone for left lower extremity pain which has been held.  Continue Tylenol as needed. -Stat CBC, lactate, EKG -250 cc bolus ordered.  PCCM consulted, appreciate help.

## 2021-07-20 NOTE — Significant Event (Signed)
Rapid Response Event Note   Reason for Call :  hypotension  Initial Focused Assessment:  Alert and oriented no distress Manual BP 70's SBP Tachy 110's  Earlier milrone attempted with no success  Interventions:  NS 250 cc bolus per Dr Marlowe Sax  Plan of Care:  Continue to monitor, Dr Marlowe Sax consulting CCM   Event Summary:   MD Notified: 2310 Call Time: 2300 Arrival Time: 2305 End Time: 2315  Charlyne Quale, RN

## 2021-07-20 NOTE — Care Management Important Message (Signed)
Important Message  Patient Details  Name: [name] MRN: [id number] Date of Birth: [birth year]   Medicare Important Message Given:  Yes     Shelda Altes 07/20/2021, 10:04 AM

## 2021-07-21 DIAGNOSIS — R570 Cardiogenic shock: Secondary | ICD-10-CM | POA: Diagnosis not present

## 2021-07-21 DIAGNOSIS — L03115 Cellulitis of right lower limb: Secondary | ICD-10-CM | POA: Diagnosis not present

## 2021-07-21 DIAGNOSIS — M79661 Pain in right lower leg: Secondary | ICD-10-CM | POA: Diagnosis not present

## 2021-07-21 DIAGNOSIS — I9589 Other hypotension: Secondary | ICD-10-CM | POA: Diagnosis not present

## 2021-07-21 LAB — RENAL FUNCTION PANEL
Albumin: 3.2 g/dL — ABNORMAL LOW (ref 3.5–5.0)
Anion gap: 11 (ref 5–15)
BUN: 27 mg/dL — ABNORMAL HIGH (ref 6–20)
CO2: 27 mmol/L (ref 22–32)
Calcium: 9.5 mg/dL (ref 8.9–10.3)
Chloride: 96 mmol/L — ABNORMAL LOW (ref 98–111)
Creatinine, Ser: 3.98 mg/dL — ABNORMAL HIGH (ref 0.44–1.00)
GFR, Estimated: 13 mL/min — ABNORMAL LOW (ref >60)
Glucose, Bld: 122 mg/dL — ABNORMAL HIGH (ref 70–99)
Phosphorus: 3.7 mg/dL (ref 2.5–4.6)
Potassium: 4.6 mmol/L (ref 3.5–5.1)
Sodium: 134 mmol/L — ABNORMAL LOW (ref 135–145)

## 2021-07-21 LAB — GLUCOSE, CAPILLARY
Glucose-Capillary: 106 mg/dL — ABNORMAL HIGH (ref 70–99)
Glucose-Capillary: 180 mg/dL — ABNORMAL HIGH (ref 70–99)
Glucose-Capillary: 113 mg/dL — ABNORMAL HIGH (ref 70–99)
Glucose-Capillary: 152 mg/dL — ABNORMAL HIGH (ref 70–99)

## 2021-07-21 LAB — CBC
HCT: 36.7 % (ref 36.0–46.0)
Hemoglobin: 11.1 g/dL — ABNORMAL LOW (ref 12.0–15.0)
MCH: 30.4 pg (ref 26.0–34.0)
MCHC: 30.2 g/dL (ref 30.0–36.0)
MCV: 100.5 fL — ABNORMAL HIGH (ref 80.0–100.0)
Platelets: 237 10*3/uL (ref 150–400)
RBC: 3.65 MIL/uL — ABNORMAL LOW (ref 3.87–5.11)
RDW: 17.6 % — ABNORMAL HIGH (ref 11.5–15.5)
WBC: 7.2 10*3/uL (ref 4.0–10.5)
nRBC: 0 % (ref 0.0–0.2)

## 2021-07-21 LAB — LACTIC ACID, PLASMA: Lactic Acid, Venous: 2.1 mmol/L (ref 0.5–1.9)

## 2021-07-21 MED ORDER — HYDROMORPHONE HCL 2 MG PO TABS
2.0000 mg | ORAL_TABLET | ORAL | Status: DC | PRN
  Administered 2021-07-21 (×2): 2 mg via ORAL
  Administered 2021-07-22 – 2021-07-23 (×2): 4 mg via ORAL
  Administered 2021-07-23 (×3): 2 mg via ORAL
  Administered 2021-07-23: 4 mg via ORAL
  Administered 2021-07-24: 2 mg via ORAL
  Administered 2021-07-24: 4 mg via ORAL
  Administered 2021-07-24: 2 mg via ORAL
  Administered 2021-07-24: 4 mg via ORAL
  Administered 2021-07-24: 2 mg via ORAL
  Administered 2021-07-25 (×2): 4 mg via ORAL
  Filled 2021-07-21 (×2): qty 1
  Filled 2021-07-21: qty 2
  Filled 2021-07-21 (×2): qty 1
  Filled 2021-07-21: qty 2
  Filled 2021-07-21: qty 1
  Filled 2021-07-21 (×2): qty 2
  Filled 2021-07-21: qty 1
  Filled 2021-07-21: qty 2
  Filled 2021-07-21: qty 1
  Filled 2021-07-21 (×2): qty 2
  Filled 2021-07-21: qty 1
  Filled 2021-07-21: qty 2

## 2021-07-21 NOTE — Progress Notes (Addendum)
PROGRESS NOTE  [name]  POE:[id number] DOB: April 13, 1966 DOA: 06/21/2021 PCP: Noreene Larsson, NP   Brief Narrative: 55 year old female with a history of end-stage renal disease on hemodialysis, has had a prolonged hospitalization since she was admitted on 06/21/2021 for cellulitis and concerns for sepsis.  She was treated with antibiotics, but had continued issues with hypotension.  Upon further evaluation, it was felt that her hypotension was more related to cardiogenic shock from RV failure and pulmonary hypertension group 3.  She is being followed by cardiology, nephrology and PCCM.  She is intermittently required vasopressors for hypotension.  She has been off vasopressors since 7/20 and has tolerated hemodialysis.  Currently on midodrine.  Transferred to [facility] service on 7/23.  Overnight on 7/26 patient became tachycardic and hypotensive concerns for left lower extremity cellulitis/sepsis started on IV daptomycin and cefepime.  CT of the thigh was ordered, patient was seen by wound care.  Infectious disease team consulted, remove midline and start linezolid twice daily. Hemodialysis continues to assist with volume removal.   Assessment & Plan: Principal Problem:   Hypotension Active Problems:   Lymphedema of lower extremity   Morbid obesity (HCC)   Obesity hypoventilation syndrome (HCC)   Cellulitis   ESRD on dialysis ([place])   Staphylococcus epidermidis bacteremia   Elevated troponin   Chronic right heart failure (HCC)   Type 2 diabetes mellitus with hyperglycemia (HCC)   Lymphedema   Cardiogenic shock (HCC)   Bilateral calf pain   Pressure injury of skin   Pulmonary hypertension (HCC)  Left lower extremity 5 cm X 11 cm full-thickness wound in medial and posterior upper thigh: Cellulitis thought to have been ruled out per ID. CT only showed chronic inflammation and lymphedema - Continue BID dressing changes.  - Compression stockings, unna boots for lymphedema - Continue to have no  evidence of complicating purulence or cellulitis on recheck 7/30. - Convert oxycodone to equianalgesic dose of dilaudid given ESRD.  Infected RUE midline catheter: Has been discontinued.  - Continue linezolid x5 days after midline removal (will complete 8/2 PM)   Cardiogenic shock due to RV failure Pulmonary hypertension Acute on chronic diastolic congestive heart failure - Off vasopressors since 7/20, continuing midodrine at high dose.  - Planning HD MWF. If hypotensive, recommend small volume boluses. EDW thought to be 135.5kg, remains grossly above that and overloaded. - Pt will need to be able to transfer to HD chair and sit for full treatment prior to discharge.  - Needs advanced heart failure clinic follow up (signed off during this hospitalization)   Atypical atrial flutter: NSR s/p DCCV 7/15.  - Continue amiodarone, eliquis.    ESRD: AVF largely occluded, so ligation not required at this time.  - HD as above per nephrology. Needs to sit up in chair to discharge for outpatient HD. Encouraged frequent PT/OT/mobility per RN.   OSA: Suspected - Continue CPAP qHS - Outpatient sleep study recommended.    Fibromyalgia - Continue duloxetine.   Anemia of chronic kidney disease: Stable without bleeding.  - ESA per nephrology.   T2DM with hyperglycemia: HbA1c 6.9%.  - Continue SSI, very modest requirements.  - Continue carb-limited diet (in addition to heart healthy/renal)   Weakness:  - Continue PT/OT efforts. PT recommends SNF placement   Goals of care discussion: Due to multiple medical comorbidities and acute ongoing issues.  Palliative care team was consulted.  Patient wishes to be full code with full scope of treatment, hopeful for improvement and  going to rehab.   RN Pressure Injury Documentation: Pressure Injury 07/04/21 Thigh Posterior;Proximal;Right Stage 2 -  Partial thickness loss of dermis presenting as a shallow open injury with a red, pink wound bed without slough.  (Active)  07/04/21 1200  Location: Thigh  Location Orientation: Posterior;Proximal;Right  Staging: Stage 2 -  Partial thickness loss of dermis presenting as a shallow open injury with a red, pink wound bed without slough.  Wound Description (Comments):   Present on Admission: No   Obesity: Estimated body mass index is 52.39 kg/m as calculated from the following:   Height as of this encounter: _0  (1.651 m).   Weight as of this encounter: 142.8 kg.  DVT prophylaxis: Eliquis Code Status: Full Family Communication: None at bedside Disposition Plan:  Status is: Inpatient  Remains inpatient appropriate because:Inpatient level of care appropriate due to severity of illness. Needs improvement in volume status with HD which will facilitate mobility which will allow her to transfer and remain in chair for duration of hemodialysis sessions as an outpatient.  Dispo: The patient is from: Home              Anticipated d/c is to: SNF              Patient currently is not medically stable to d/c.   Difficult to place patient No  Consultants:  Nephrology ID  Subjective: Had hypotension and associated sinus tachycardia after 4.5L taken off at HD yesterday. This was associated with dyspnea, lightheadedness, no chest pain or palpitations. Not resolved with giving her usual high dose midodrine. Symptoms and vitals improved with low volume NS boluses. She's been taken off oxycodone (which she's been on for months) and reports excruciating pain in the left thigh. No fevers or chills.   Objective: Vitals:   07/21/21 0344 07/21/21 0400 07/21/21 0543 07/21/21 0944  BP: (!) 96/53 97/63 119/68 123/74  Pulse: 65 66 67 64  Resp: _1 Temp: 99 F (37.2 C)   98 F (36.7 C)  TempSrc: Axillary     SpO2: 99% 98% 100% 100%  Weight: (!) 142.8 kg     Height:        Intake/Output Summary (Last 24 hours) at 07/21/2021 0949 Last data filed at 07/21/2021 0025 Gross per 24 hour  Intake 738.46 ml   Output 4000 ml  Net -3261.54 ml    Filed Weights   07/20/21 0900 07/20/21 1313 07/21/21 0344  Weight: (!) 145.4 kg (!) 141.4 kg (!) 142.8 kg   Gen: 55 y.o. female in no distress Pulm: Nonlabored breathing room air. Clear. CV: Regular rate and rhythm. No murmur, rub, or gallop. No definite JVD, Improving diffuse, predominantly dependent edema GI: Abdomen protuberant, non-tender, non-distended, with normoactive bowel sounds.  Ext: Warm, no deformities Skin: No new rashes, lesions or ulcers on visualized skin. Due to not receiving pain meds yet this AM, pt declines exam of left thigh wound.  Neuro: Alert and oriented. No focal neurological deficits. Psych: Judgement and insight appear fair. Mood euthymic & affect congruent. Behavior is appropriate.    Data Reviewed: I have personally reviewed following labs and imaging studies  CBC: Recent Labs  Lab 07/17/21 0047 07/18/21 0047 07/19/21 0205 07/20/21 0031 07/21/21 0120  WBC 8.8 8.2 8.6 7.3 7.2  HGB 10.4* 10.8* 11.7* 10.6* 11.1*  HCT 33.1* 35.3* 37.9 34.8* 36.7  MCV 99.1 100.3* 99.0 99.7 100.5*  PLT 214 230 237 228 546   Basic Metabolic Panel:  Recent Labs  Lab 07/15/21 0039 07/16/21 0015 07/17/21 0047 07/18/21 0047 07/19/21 0205 07/21/21 0120  NA 129* 133* 133* 134* 130* 134*  K 4.8 4.8 4.7 4.6 5.3* 4.6  CL 94* 95* 94* 95* 92* 96*  CO2 _0 GLUCOSE 113* 121* 135* 129* 99 122*  BUN 39* 30* 42* 31* 42* 27*  CREATININE 6.67* 5.38* 6.42* 5.01* 5.94* 3.98*  CALCIUM 9.0 9.3 9.3 9.2 9.7 9.5  MG 2.4 2.3 2.5* 2.4 2.5*  --   PHOS  --   --   --  3.5  --  3.7   GFR: Estimated Creatinine Clearance: 23.3 mL/min (A) (by C-G formula based on SCr of 3.98 mg/dL (H)). Liver Function Tests: Recent Labs  Lab 07/17/21 0047 07/18/21 0047 07/19/21 0205 07/21/21 0120  AST _1 --   ALT _2 --   ALKPHOS 257* 267* 287*  --   BILITOT 0.7 0.6 1.0  --   PROT 6.7 7.0 8.0  --   ALBUMIN 3.1* 3.1* 3.5 3.2*    No results for input(s): LIPASE, AMYLASE in the last 168 hours. No results for input(s): AMMONIA in the last 168 hours. Coagulation Profile: Recent Labs  Lab 07/15/21 0420  INR 1.9*   Cardiac Enzymes: Recent Labs  Lab 07/16/21 0015  CKTOTAL 11*   BNP (last 3 results) No results for input(s): PROBNP in the last 8760 hours. HbA1C: No results for input(s): HGBA1C in the last 72 hours. CBG: Recent Labs  Lab 07/20/21 0627 07/20/21 1424 07/20/21 1754 07/20/21 2104 07/21/21 0618  GLUCAP 110* 95 124* 116* 106*     Recent Results (from the past 240 hour(s))  Culture, blood (x 2)     Status: None   Collection Time: 07/15/21  3:58 AM   Specimen: BLOOD  Result Value Ref Range Status   Specimen Description BLOOD SITE NOT SPECIFIED  Final   Special Requests   Final    BOTTLES DRAWN AEROBIC ONLY Blood Culture adequate volume   Culture   Final    NO GROWTH 5 DAYS Performed at [facility], [place] [address] [place], [place] [address]    Report Status 07/20/2021 FINAL  Final  Culture, blood (x 2)     Status: Abnormal   Collection Time: 07/15/21  3:58 AM   Specimen: BLOOD  Result Value Ref Range Status   Specimen Description BLOOD SITE NOT SPECIFIED  Final   Special Requests   Final    BOTTLES DRAWN AEROBIC ONLY Blood Culture adequate volume   Culture  Setup Time   Final    GRAM POSITIVE COCCI IN CLUSTERS AEROBIC BOTTLE ONLY CRITICAL RESULT CALLED TO, READ BACK BY AND VERIFIED WITH: PHARMD C PIERCE [id number] AT 15 AM BY CM    Culture (A)  Final    STAPHYLOCOCCUS EPIDERMIDIS THE SIGNIFICANCE OF ISOLATING THIS ORGANISM FROM A SINGLE SET OF BLOOD CULTURES WHEN MULTIPLE SETS ARE DRAWN IS UNCERTAIN. PLEASE NOTIFY THE MICROBIOLOGY DEPARTMENT WITHIN ONE WEEK IF SPECIATION AND SENSITIVITIES ARE REQUIRED. Performed at [facility], [place] [address] [place], [place] [address]    Report Status 07/18/2021 FINAL  Final  Blood Culture ID Panel (Reflexed)     Status: Abnormal    Collection Time: 07/15/21  3:58 AM  Result Value Ref Range Status   Enterococcus faecalis NOT DETECTED NOT DETECTED Final   Enterococcus Faecium NOT DETECTED NOT DETECTED Final   Listeria monocytogenes NOT  DETECTED NOT DETECTED Final   Staphylococcus species DETECTED (A) NOT DETECTED Final    Comment: CRITICAL RESULT CALLED TO, READ BACK BY AND VERIFIED WITH: PHARMD C PIERCE [id number] AT 725 AM BY CM    Staphylococcus aureus (BCID) NOT DETECTED NOT DETECTED Final   Staphylococcus epidermidis DETECTED (A) NOT DETECTED Final    Comment: Methicillin (oxacillin) resistant coagulase negative staphylococcus. Possible blood culture contaminant (unless isolated from more than one blood culture draw or clinical case suggests pathogenicity). No antibiotic treatment is indicated for blood  culture contaminants. CRITICAL RESULT CALLED TO, READ BACK BY AND VERIFIED WITH: PHARMD C PIERCE [id number] AT 77 AM BY CM    Staphylococcus lugdunensis NOT DETECTED NOT DETECTED Final   Streptococcus species NOT DETECTED NOT DETECTED Final   Streptococcus agalactiae NOT DETECTED NOT DETECTED Final   Streptococcus pneumoniae NOT DETECTED NOT DETECTED Final   Streptococcus pyogenes NOT DETECTED NOT DETECTED Final   A.calcoaceticus-baumannii NOT DETECTED NOT DETECTED Final   Bacteroides fragilis NOT DETECTED NOT DETECTED Final   Enterobacterales NOT DETECTED NOT DETECTED Final   Enterobacter cloacae complex NOT DETECTED NOT DETECTED Final   Escherichia coli NOT DETECTED NOT DETECTED Final   Klebsiella aerogenes NOT DETECTED NOT DETECTED Final   Klebsiella oxytoca NOT DETECTED NOT DETECTED Final   Klebsiella pneumoniae NOT DETECTED NOT DETECTED Final   Proteus species NOT DETECTED NOT DETECTED Final   Salmonella species NOT DETECTED NOT DETECTED Final   Serratia marcescens NOT DETECTED NOT DETECTED Final   Haemophilus influenzae NOT DETECTED NOT DETECTED Final   Neisseria meningitidis NOT DETECTED NOT DETECTED  Final   Pseudomonas aeruginosa NOT DETECTED NOT DETECTED Final   Stenotrophomonas maltophilia NOT DETECTED NOT DETECTED Final   Candida albicans NOT DETECTED NOT DETECTED Final   Candida auris NOT DETECTED NOT DETECTED Final   Candida glabrata NOT DETECTED NOT DETECTED Final   Candida krusei NOT DETECTED NOT DETECTED Final   Candida parapsilosis NOT DETECTED NOT DETECTED Final   Candida tropicalis NOT DETECTED NOT DETECTED Final   Cryptococcus neoformans/gattii NOT DETECTED NOT DETECTED Final   Methicillin resistance mecA/C DETECTED (A) NOT DETECTED Final    Comment: CRITICAL RESULT CALLED TO, READ BACK BY AND VERIFIED WITH: PHARMD C PIERCE [id number] AT 725 AM BY CM Performed at [facility], [address], [place], [place] [address]   Aerobic Culture w Gram Stain (superficial specimen)     Status: None   Collection Time: 07/15/21  4:30 AM   Specimen: Wound  Result Value Ref Range Status   Specimen Description WOUND  Final   Special Requests POSTERIOR LEFT LEG  Final   Gram Stain   Final    NO WBC SEEN ABUNDANT GRAM POSITIVE COCCI ABUNDANT GRAM NEGATIVE RODS ABUNDANT GRAM POSITIVE RODS    Culture   Final    ABUNDANT MULTIPLE ORGANISMS PRESENT, NONE PREDOMINANT NO STAPHYLOCOCCUS AUREUS ISOLATED Performed at [facility], [place] [address] [place], [place] [address]    Report Status 07/17/2021 FINAL  Final  Aerobic Culture w Gram Stain (superficial specimen)     Status: None   Collection Time: 07/15/21  4:30 AM   Specimen: Wound  Result Value Ref Range Status   Specimen Description WOUND  Final   Special Requests LEFT FRONT LEG  Final   Gram Stain   Final    NO WBC SEEN ABUNDANT GRAM POSITIVE COCCI FEW GRAM POSITIVE RODS FEW GRAM NEGATIVE RODS    Culture   Final  ABUNDANT MULTIPLE ORGANISMS PRESENT, NONE PREDOMINANT NO STAPHYLOCOCCUS AUREUS ISOLATED Performed at [facility], [place] [address], [place], [place] [address]    Report Status 07/17/2021 FINAL   Final      Radiology Studies: No results found.  Scheduled Meds:  amiodarone  200 mg Oral Daily   apixaban  5 mg Oral BID   calcium acetate  667 mg Oral TID WC   Chlorhexidine Gluconate Cloth  6 each Topical Daily   Chlorhexidine Gluconate Cloth  6 each Topical Q0600   [START ON 07/22/2021] darbepoetin (ARANESP) injection - DIALYSIS  60 mcg Intravenous Q Wed-HD   DULoxetine  20 mg Oral Daily   feeding supplement (NEPRO CARB STEADY)  237 mL Oral TID BM   insulin aspart  0-15 Units Subcutaneous TID WC   insulin aspart  0-5 Units Subcutaneous QHS   linezolid  600 mg Oral Q12H   mouth rinse  15 mL Mouth Rinse BID   midodrine  15 mg Oral TID WC   multivitamin  1 tablet Oral QHS   polyethylene glycol  17 g Oral Daily   senna  1 tablet Oral Daily   Continuous Infusions:  sodium chloride     promethazine (PHENERGAN) injection (IM or IVPB) Stopped (07/02/21 1831)     LOS: 30 days   Time spent: 25 minutes.  Patrecia Pour, MD [facility] [email or web address] 07/21/2021, 9:49 AM

## 2021-07-21 NOTE — Progress Notes (Signed)
Lab informed of a Lactic acid level of 2.1.  Paged on call physician. No new orders.  Will continue to monitor.  Lupita Dawn, RN

## 2021-07-21 NOTE — Progress Notes (Addendum)
Occupational Therapy Treatment Patient Details Name: [name] MRN: [id number] DOB: 1966/08/05 Today's Date: 07/21/2021    History of present illness 55 y.o. female presenting from [place] ED 7/3 with lethargy, hypotension and elevated lactic acid after d/c from [facility] earlier that day with septic shock and cellulitis. Patient found to have AMS, septic shock, acute respiratory failure, acute on chronic RV failure. Transferred to ICU on 7/12 due to hypotension. PMHX significant for obesity, DMII, COPD, CAD, lymphedema, ESRD on HD MWF and CHF.   OT comments  Session focused on progression of B LE weightbearing tolerance via Kreg tilt bed. Pt continues to be limited by B LE pain despite premedication. Pt able to achieve 62* tilt for 5 minutes to work on pushing up with B UE and B knee extension as preparatory activity for ADL transfers. Provided additional theraband for bedrails to maximize UB strength. Plan to progress transfer attempts and progress HEP resistance in next sessions. Continue to recommend SNF rehab.   BP pre-activity: 126/75 BP at 62* tilt: 133/77   Follow Up Recommendations  SNF;Supervision/Assistance - 24 hour    Equipment Recommendations  Hospital bed    Recommendations for Other Services      Precautions / Restrictions Precautions Precautions: Fall Precaution Comments: bil LE edema, L LE/groin wounds Restrictions Weight Bearing Restrictions: No       Mobility Bed Mobility Overal bed mobility: Needs Assistance             General bed mobility comments: use trendelenburg feature and bed padding to assist pt's significant efforts with the HOB frame to scoot up toward HOB.    Transfers                 General transfer comment: Use of KREG bed to stand. Able to acheive 62* tilt with tolerable pain with around 5 min of tolerance to peak tilt    Balance                                           ADL either performed or assessed with  clinical judgement   ADL Overall ADL's : Needs assistance/impaired                                       General ADL Comments: Session focused on Kreg bed tilt progression to facilitate weightbearing through LEs, standing tolerance, and UB strength to assist with ADL tasks.     Vision   Vision Assessment?: No apparent visual deficits   Perception     Praxis      Cognition Arousal/Alertness: Awake/alert Behavior During Therapy: WFL for tasks assessed/performed Overall Cognitive Status: Within Functional Limits for tasks assessed                                 General Comments: very pleasant, anxious with movement but motivated to participate        Exercises Exercises: Other exercises Other Exercises Other Exercises: pushing through B UE on bedrails to facilitate improved weightbearing/weightshifting Other Exercises: B knee extension with 5 second hold   Shoulder Instructions       General Comments      Pertinent Vitals/ Pain       Pain Assessment:  Faces Faces Pain Scale: Hurts little more Pain Location: LEs Pain Descriptors / Indicators: Grimacing;Guarding;Sore;Burning;Aching Pain Intervention(s): Limited activity within patient's tolerance;Monitored during session;Premedicated before session  Home Living                                          Prior Functioning/Environment              Frequency  Min 2X/week        Progress Toward Goals  OT Goals(current goals can now be found in the care plan section)  Progress towards OT goals: Progressing toward goals  Acute Rehab OT Goals Patient Stated Goal: get stronger and return home OT Goal Formulation: With patient Time For Goal Achievement: 07/30/21 Potential to Achieve Goals: Fair ADL Goals Pt Will Perform Grooming: sitting;with set-up Pt Will Perform Upper Body Dressing: with set-up;sitting Pt Will Transfer to Toilet: with max assist;with +2  assist;bedside commode Pt Will Perform Toileting - Clothing Manipulation and hygiene: with mod assist;sitting/lateral leans;sit to/from stand;with adaptive equipment Pt/caregiver will Perform Home Exercise Program: Increased strength;Both right and left upper extremity;With theraband;With theraputty;Independently Additional ADL Goal #1: Pt will tolerate 10 minutes sitting EOB dynamically with supervision as precursor to ADLs.  Plan Discharge plan remains appropriate;Frequency remains appropriate    Co-evaluation                 AM-PAC OT "6 Clicks" Daily Activity     Outcome Measure   Help from another person eating meals?: None Help from another person taking care of personal grooming?: A Little Help from another person toileting, which includes using toliet, bedpan, or urinal?: Total Help from another person bathing (including washing, rinsing, drying)?: A Lot Help from another person to put on and taking off regular upper body clothing?: A Little Help from another person to put on and taking off regular lower body clothing?: Total 6 Click Score: 14    End of Session Equipment Utilized During Treatment: Oxygen  OT Visit Diagnosis: Other abnormalities of gait and mobility (R26.89);Muscle weakness (generalized) (M62.81);Pain Pain - Right/Left: Right Pain - part of body: Leg;Ankle and joints of foot   Activity Tolerance Patient tolerated treatment well;Patient limited by pain   Patient Left in bed;with call bell/phone within reach   Nurse Communication Mobility status        Time: 1116-1150 OT Time Calculation (min): 34 min  Charges: OT General Charges $OT Visit: 1 Visit OT Treatments $Therapeutic Activity: 23-37 mins  Malachy Chamber, OTR/L Acute Rehab Services Office: [phone]    [name] 07/21/2021, 12:40 PM

## 2021-07-21 NOTE — Progress Notes (Signed)
Mobility Specialist: Progress Note   07/21/21 1158  Mobility  Activity  (Tilt Bed)  Level of Assistance +2 (takes two people) Personal assistant)  Assistive Device None  Minutes Stood 5 minutes  Mobility Sit up in bed/chair position for meals  Mobility Response Tolerated well  Mobility performed by Mobility specialist;Other (comment) (OT Almyra Free)  $Mobility charge 1 Mobility   Assisted OT Almyra Free in tilting pt to practice weight bearing. Pt was able to tilt to 62 degrees with 171.6lbs of pressure on the footboard. Pt back in the seated position with RN present in the room.   [facility] [place] Colvin Mobility Specialist Mobility Specialist Phone: [phone]

## 2021-07-21 NOTE — Progress Notes (Addendum)
Physical Therapy Treatment Patient Details Name: [name] MRN: [id number] DOB: [birth year] Today's Date: 07/21/2021    History of Present Illness 55 y.o. female presenting from [place] ED 7/3 with lethargy, hypotension and elevated lactic acid after d/c from [facility] earlier that day with septic shock and cellulitis. Patient found to have AMS, septic shock, acute respiratory failure, acute on chronic RV failure. Transferred to ICU on 7/12 due to hypotension. PMHX significant for obesity, DMII, COPD, CAD, lymphedema, ESRD on HD MWF and CHF.    PT Comments    Pt very pleasant and willing to participate in mobility. Pt with significant improvement in ability to transition into sitting EOB and perform seated exercises EOB. Pt able to perform mini squats in limited tilt at 11 degrees and educated for HEP throughout the day to continue to maximize strength. Pt able to stand at EOB but not yet able to transfer OOB. Pt would greatly benefit from daily tilts as well as performing mini squats throughout the day with RN made aware as well.   HR 73 SpO2 95% on 1L    Follow Up Recommendations  SNF     Equipment Recommendations  Wheelchair (measurements PT);Wheelchair cushion (measurements PT);Hospital bed    Recommendations for Other Services       Precautions / Restrictions Precautions Precautions: Fall Precaution Comments: bil LE edema, L LE/groin wounds Restrictions Weight Bearing Restrictions: No    Mobility  Bed Mobility Overal bed mobility: Needs Assistance Bed Mobility: Supine to Sit;Sit to Supine     Supine to sit: Min guard;HOB elevated     General bed mobility comments: HOB 30 degrees with use of rail and increased time pt able to pivot to left side of bed without physical assist. Return to bed mod assist to lift legs to surface. Pt able to slide toward [facility] with rails without assist. Rolling mod assist for crossing legs and to shift hips to roll bil.    Transfers Overall  transfer level: Needs assistance   Transfers: Sit to/from Stand Sit to Stand: Mod assist;+2 physical assistance         General transfer comment: pt ab;e to stand x 2 trials from EOB hooking bil UE on therapists. Pt stood 5 sec and 20 sec. Maintained partial hip flexion with increased upright stance. Not yet able to shift weight  Ambulation/Gait             General Gait Details: unable   Stairs             Wheelchair Mobility    Modified Rankin (Stroke Patients Only)       Balance Overall balance assessment: Needs assistance Sitting-balance support: No upper extremity supported;Feet unsupported Sitting balance-Leahy Scale: Fair Sitting balance - Comments: supervision EOB   Standing balance support: Bilateral upper extremity supported Standing balance-Leahy Scale: Poor Standing balance comment: knees blocked and bil UE support                            Cognition Arousal/Alertness: Awake/alert Behavior During Therapy: WFL for tasks assessed/performed Overall Cognitive Status: Within Functional Limits for tasks assessed                                 General Comments: very pleasant, anxious with movement but motivated to participate      Exercises General Exercises - Lower Extremity Long Arc Quad: AROM;Both;Seated;20  reps Straight Leg Raises: AAROM;Strengthening;Supine;Both;20 reps Hip Flexion/Marching: AAROM;Both;Seated;20 reps Mini-Sqauts: AROM;Both;Supine;20 reps;Other (comment) (pt at 11 degree tilt pushing into foot board for squats      General Comments        Pertinent Vitals/Pain Pain Assessment: Faces Faces Pain Scale: Hurts little more Pain Location: LEs Pain Descriptors / Indicators: Grimacing;Guarding;Sore Pain Intervention(s): Limited activity within patient's tolerance;Monitored during session;Premedicated before session    Home Living                      Prior Function            PT  Goals (current goals can now be found in the care plan section) Acute Rehab PT Goals Patient Stated Goal: get stronger and return home Time For Goal Achievement: 08/04/21 Progress towards PT goals: Goals downgraded-see care plan    Frequency    Min 2X/week      PT Plan Current plan remains appropriate    Co-evaluation              AM-PAC PT "6 Clicks" Mobility   Outcome Measure  Help needed turning from your back to your side while in a flat bed without using bedrails?: A Lot Help needed moving from lying on your back to sitting on the side of a flat bed without using bedrails?: A Lot Help needed moving to and from a bed to a chair (including a wheelchair)?: Total Help needed standing up from a chair using your arms (e.g., wheelchair or bedside chair)?: Total Help needed to walk in hospital room?: Total Help needed climbing 3-5 steps with a railing? : Total 6 Click Score: 8    End of Session Equipment Utilized During Treatment: Oxygen Activity Tolerance: Patient tolerated treatment well Patient left: in bed;with call bell/phone within reach Nurse Communication: Mobility status;Need for lift equipment PT Visit Diagnosis: Other abnormalities of gait and mobility (R26.89);Muscle weakness (generalized) (M62.81);Pain     Time: 0929-5747 PT Time Calculation (min) (ACUTE ONLY): 30 min  Charges:  $Therapeutic Exercise: 8-22 mins $Therapeutic Activity: 8-22 mins                     Kortland Nichols P, PT Acute Rehabilitation Services Pager: [phone] Office: [place] 07/21/2021, 1:52 PM

## 2021-07-21 NOTE — Progress Notes (Signed)
[facility] Progress Note   Outpatient orders: [facility], Monday Wednesday Friday, 4hr 15 minutes.  350 BFR/500 DFR  1K, 2.5 calcium, 138 sodium, EDW 135.5 kg, 15-gauge needles, machine temp 37  Assessment/ Plan:   1) ESRD  - Eventually need to work toward doing HD up in chair.  Not able to at this time due to deconditioning  -Plan for dialysis today and then maintain MWF schedule.  Trying to remove fluid as able.  This was limited by hypotension last night.  May have to be more gentle.    2) Acute on chronic RV failure with shock, pulmonary HTN -CTA r/o'd PE -TTE with normal EF, mod LVH and severe RV dysfunction -RHC with elevated RA pressure (22), no marked change in pressures or CO with AVF occlusion -Her AVF does not appear to be very high flow.  Will  not ligate it. -on midodrine 15 TID -sildenafil stopped in light of ongoing hypotension UF as able with HD-  using low temp- albumin and profile to get volume off-   Eventually will stop using albumin as they cannnot give as OP but trying to maximize while she's in the hospital to remove excess volume. On max midodrine.    3) Volume/ hypertension: prior EDW 135.5kg. UF with HD.  Over EDW still but making progress -trying to remove volume as able but limited by hypotension last night lowest  weight recorded at 130-131 kg but she still has a lot of volume onboard   4) Anemia of Chronic Kidney Disease: Continue ESA as ordered   5) Secondary Hyperparathyroidism/Hyperphosphatemia: sensipar 60 qtx -  phos OK on phoslo -    6) DM2 with hyperglycemia: mgmt per primary service  Subjective:   Hypotension with sinus tachycardia last night.  She also had dizziness and lightheadedness she was given some normal saline back with improvement in her symptoms.  She continues to have pain in her thigh   Objective:   BP 123/74 (BP Location: Right Arm)   Pulse 64   Temp 98 F (36.7 C)   Resp 18   Ht _0  (1.651 m)    Wt (!) 142.8 kg   SpO2 100%   BMI 52.39 kg/m   Intake/Output Summary (Last 24 hours) at 07/21/2021 1009 Last data filed at 07/21/2021 0025 Gross per 24 hour  Intake 738.46 ml  Output 4000 ml  Net -3261.54 ml    Weight change: -2.5 kg  Physical Exam: Gen:nad, sitting up in bed CVS:s1s2, rrr Resp: normal wob LSL:HTDSK, soft, nt AJG:OTLXBWIOMB bilateral LE's w/ chronic skin changes, wound left inner thigh Neuro: awake, alert, moves all ext spontaneously, speech clear and coherent HD access: lt Cimino +b/t  Imaging: No results found.  Labs: BMET Recent Labs  Lab 07/15/21 0039 07/16/21 0015 07/17/21 0047 07/18/21 0047 07/19/21 0205 07/21/21 0120  NA 129* 133* 133* 134* 130* 134*  K 4.8 4.8 4.7 4.6 5.3* 4.6  CL 94* 95* 94* 95* 92* 96*  CO2 _1 GLUCOSE 113* 121* 135* 129* 99 122*  BUN 39* 30* 42* 31* 42* 27*  CREATININE 6.67* 5.38* 6.42* 5.01* 5.94* 3.98*  CALCIUM 9.0 9.3 9.3 9.2 9.7 9.5  PHOS  --   --   --  3.5  --  3.7   CBC Recent Labs  Lab 07/18/21 0047 07/19/21 0205 07/20/21 0031 07/21/21 0120  WBC 8.2 8.6 7.3 7.2  HGB 10.8* 11.7* 10.6* 11.1*  HCT 35.3* 37.9  34.8* 36.7  MCV 100.3* 99.0 99.7 100.5*  PLT 230 237 228 237    Medications:     amiodarone  200 mg Oral Daily   apixaban  5 mg Oral BID   calcium acetate  667 mg Oral TID WC   Chlorhexidine Gluconate Cloth  6 each Topical Daily   Chlorhexidine Gluconate Cloth  6 each Topical Q0600   [START ON 07/22/2021] darbepoetin (ARANESP) injection - DIALYSIS  60 mcg Intravenous Q Wed-HD   DULoxetine  20 mg Oral Daily   feeding supplement (NEPRO CARB STEADY)  237 mL Oral TID BM   insulin aspart  0-15 Units Subcutaneous TID WC   insulin aspart  0-5 Units Subcutaneous QHS   linezolid  600 mg Oral Q12H   mouth rinse  15 mL Mouth Rinse BID   midodrine  15 mg Oral TID WC   multivitamin  1 tablet Oral QHS   polyethylene glycol  17 g Oral Daily   senna  1 tablet Oral Daily      Reesa Chew  07/21/2021, 10:09 AM

## 2021-07-22 DIAGNOSIS — I9589 Other hypotension: Secondary | ICD-10-CM | POA: Diagnosis not present

## 2021-07-22 DIAGNOSIS — M79661 Pain in right lower leg: Secondary | ICD-10-CM | POA: Diagnosis not present

## 2021-07-22 DIAGNOSIS — L03115 Cellulitis of right lower limb: Secondary | ICD-10-CM | POA: Diagnosis not present

## 2021-07-22 DIAGNOSIS — R570 Cardiogenic shock: Secondary | ICD-10-CM | POA: Diagnosis not present

## 2021-07-22 LAB — RENAL FUNCTION PANEL
Albumin: 3 g/dL — ABNORMAL LOW (ref 3.5–5.0)
Anion gap: 12 (ref 5–15)
BUN: 41 mg/dL — ABNORMAL HIGH (ref 6–20)
CO2: 27 mmol/L (ref 22–32)
Calcium: 9.4 mg/dL (ref 8.9–10.3)
Chloride: 93 mmol/L — ABNORMAL LOW (ref 98–111)
Creatinine, Ser: 4.97 mg/dL — ABNORMAL HIGH (ref 0.44–1.00)
GFR, Estimated: 10 mL/min — ABNORMAL LOW (ref >60)
Glucose, Bld: 129 mg/dL — ABNORMAL HIGH (ref 70–99)
Phosphorus: 3.6 mg/dL (ref 2.5–4.6)
Potassium: 6.1 mmol/L — ABNORMAL HIGH (ref 3.5–5.1)
Sodium: 132 mmol/L — ABNORMAL LOW (ref 135–145)

## 2021-07-22 LAB — CBC
HCT: 36.8 % (ref 36.0–46.0)
Hemoglobin: 11.1 g/dL — ABNORMAL LOW (ref 12.0–15.0)
MCH: 30.6 pg (ref 26.0–34.0)
MCHC: 30.2 g/dL (ref 30.0–36.0)
MCV: 101.4 fL — ABNORMAL HIGH (ref 80.0–100.0)
Platelets: 191 10*3/uL (ref 150–400)
RBC: 3.63 MIL/uL — ABNORMAL LOW (ref 3.87–5.11)
RDW: 17.8 % — ABNORMAL HIGH (ref 11.5–15.5)
WBC: 9.3 10*3/uL (ref 4.0–10.5)
nRBC: 0 % (ref 0.0–0.2)

## 2021-07-22 LAB — GLUCOSE, CAPILLARY
Glucose-Capillary: 116 mg/dL — ABNORMAL HIGH (ref 70–99)
Glucose-Capillary: 95 mg/dL (ref 70–99)
Glucose-Capillary: 149 mg/dL — ABNORMAL HIGH (ref 70–99)
Glucose-Capillary: 164 mg/dL — ABNORMAL HIGH (ref 70–99)

## 2021-07-22 MED ORDER — DARBEPOETIN ALFA 60 MCG/0.3ML IJ SOSY
PREFILLED_SYRINGE | INTRAMUSCULAR | Status: AC
  Administered 2021-07-22: 60 ug via INTRAVENOUS
  Filled 2021-07-22: qty 0.3

## 2021-07-22 MED ORDER — ALBUMIN HUMAN 25 % IV SOLN
INTRAVENOUS | Status: AC
  Administered 2021-07-22: 25 g
  Filled 2021-07-22: qty 100

## 2021-07-22 NOTE — Progress Notes (Signed)
[facility] Progress Note   Outpatient orders: [facility], Monday Wednesday Friday, 4hr 15 minutes.  350 BFR/500 DFR  1K, 2.5 calcium, 138 sodium, EDW 135.5 kg, 15-gauge needles, machine temp 37  Assessment/ Plan:   1) ESRD  - Eventually need to work toward doing HD up in chair.  Not able to at this time due to deconditioning  -Maintain MWF schedule.  Trying to remove fluid as able.  This was limited by hypotension. More gentle today.    2) Acute on chronic RV failure with shock, pulmonary HTN -CTA r/o'd PE -TTE with normal EF, mod LVH and severe RV dysfunction -RHC with elevated RA pressure (22), no marked change in pressures or CO with AVF occlusion -Her AVF does not appear to be very high flow.  Will  not ligate it. -on midodrine 15 TID -sildenafil stopped in light of ongoing hypotension UF as able with HD-  using low temp- albumin and profile to get volume off-   Eventually will stop using albumin as they cannnot give as OP but trying to maximize while she's in the hospital to remove excess volume. On max midodrine.    3) Volume/ hypertension: prior EDW 135.5kg. UF with HD.  Continues to be overloaded. Removing fluid as able on HD   4) Anemia of Chronic Kidney Disease: Continue ESA as ordered   5) Secondary Hyperparathyroidism/Hyperphosphatemia: sensipar 60 qtx -  phos OK on phoslo - obtain PTH given she has been here a month   6) DM2 with hyperglycemia: mgmt per primary service  Subjective:   Patient in good spirits today seen on dialysis.  Denies any specific complaints.  No more hypotensive episodes   Objective:   BP 109/65   Pulse 62   Temp 97.9 F (36.6 C) (Oral)   Resp 16   Ht _0  (1.651 m)   Wt (!) 148.2 kg   SpO2 100%   BMI 54.37 kg/m   Intake/Output Summary (Last 24 hours) at 07/22/2021 0076 Last data filed at 07/21/2021 1643 Gross per 24 hour  Intake 594 ml  Output --  Net 594 ml    Weight change: -1.4 kg  Physical  Exam: Gen:nad, sitting up in bed CVS: Normal rate, no audible murmur Resp: normal wob AUQ:JFHLK, soft, nt TGY:[id number] bilateral LE's w/ chronic skin changes, wound left inner thigh Neuro: awake, alert, no obvious deficits HD access: lt Cimino +b/t  Imaging: No results found.  Labs: BMET Recent Labs  Lab 07/16/21 0015 07/17/21 0047 07/18/21 0047 07/19/21 0205 07/21/21 0120 07/22/21 0104  NA 133* 133* 134* 130* 134* 132*  K 4.8 4.7 4.6 5.3* 4.6 6.1*  CL 95* 94* 95* 92* 96* 93*  CO2 _1 GLUCOSE 121* 135* 129* 99 122* 129*  BUN 30* 42* 31* 42* 27* 41*  CREATININE 5.38* 6.42* 5.01* 5.94* 3.98* 4.97*  CALCIUM 9.3 9.3 9.2 9.7 9.5 9.4  PHOS  --   --  3.5  --  3.7 3.6   CBC Recent Labs  Lab 07/19/21 0205 07/20/21 0031 07/21/21 0120 07/22/21 0104  WBC 8.6 7.3 7.2 9.3  HGB 11.7* 10.6* 11.1* 11.1*  HCT 37.9 34.8* 36.7 36.8  MCV 99.0 99.7 100.5* 101.4*  PLT 237 228 237 191    Medications:     amiodarone  200 mg Oral Daily   apixaban  5 mg Oral BID   calcium acetate  667 mg Oral TID WC   Chlorhexidine Gluconate Cloth  6 each Topical Daily   Chlorhexidine Gluconate Cloth  6 each Topical Q0600   darbepoetin (ARANESP) injection - DIALYSIS  60 mcg Intravenous Q Wed-HD   DULoxetine  20 mg Oral Daily   feeding supplement (NEPRO CARB STEADY)  237 mL Oral TID BM   insulin aspart  0-15 Units Subcutaneous TID WC   insulin aspart  0-5 Units Subcutaneous QHS   mouth rinse  15 mL Mouth Rinse BID   midodrine  15 mg Oral TID WC   multivitamin  1 tablet Oral QHS   polyethylene glycol  17 g Oral Daily   senna  1 tablet Oral Daily      [name]  07/22/2021, 9:53 AM

## 2021-07-22 NOTE — Progress Notes (Signed)
RT found pt already on CPAP but w/no oxygen titrated in, pt sats were 89%. RT bled in 3 Lpm through the CPAP dream station which was set on max 10 min 5. Pt sats now at 100% on monitor. RT will continue to monitor.

## 2021-07-22 NOTE — Progress Notes (Signed)
Mobility Specialist: Progress Note   07/22/21 1743  Mobility  Activity  (Bed Tilt)  Level of Assistance Standby assist, set-up cues, supervision of patient - no hands on  Assistive Device None  Minutes Stood 5 minutes  Mobility Sit up in bed/chair position for meals  Mobility Response Tolerated well  Mobility performed by Mobility specialist  $Mobility charge 1 Mobility   Pre-Mobility: 66 HR, 116/63 BP, 98% SpO2 Post-Mobility: 88 HR, 94% SpO2  Pt able to tilt to 73 degrees today and able to perform knee extensions while standing. Did not get weight while standing at this time. Pt positioned back into bed position sitting up with call bell in her lap.   [facility] Jessicah Croll Mobility Specialist Mobility Specialist Phone: [phone]

## 2021-07-22 NOTE — Progress Notes (Signed)
PROGRESS NOTE  [name]  IEP:[id number] DOB: 1966-06-18 DOA: 06/21/2021 PCP: Noreene Larsson, NP   Brief Narrative: 55 year old female with a history of end-stage renal disease on hemodialysis, has had a prolonged hospitalization since she was admitted on 06/21/2021 for cellulitis and concerns for sepsis.  She was treated with antibiotics, but had continued issues with hypotension.  Upon further evaluation, it was felt that her hypotension was more related to cardiogenic shock from RV failure and pulmonary hypertension group 3.  She is being followed by cardiology, nephrology and PCCM.  She is intermittently required vasopressors for hypotension.  She has been off vasopressors since 7/20 and has tolerated hemodialysis.  Currently on midodrine.  Transferred to [facility] service on 7/23.  Overnight on 7/26 patient became tachycardic and hypotensive concerns for left lower extremity cellulitis/sepsis started on IV daptomycin and cefepime.  CT of the thigh was ordered, patient was seen by wound care.  Infectious disease team consulted, remove midline and start linezolid twice daily. Hemodialysis continues to assist with volume removal.   Assessment & Plan: Principal Problem:   Hypotension Active Problems:   Lymphedema of lower extremity   Morbid obesity (HCC)   Obesity hypoventilation syndrome (HCC)   Cellulitis   ESRD on dialysis ([place])   Staphylococcus epidermidis bacteremia   Elevated troponin   Chronic right heart failure (HCC)   Type 2 diabetes mellitus with hyperglycemia (HCC)   Lymphedema   Cardiogenic shock (HCC)   Bilateral calf pain   Pressure injury of skin   Pulmonary hypertension (HCC)  Left lower extremity 5 cm X 11 cm full-thickness wound in medial and posterior upper thigh: Cellulitis thought to have been ruled out per ID. CT only showed chronic inflammation and lymphedema - Continue BID dressing changes.  - Compression stockings, unna boots for lymphedema - No evidence of  infection on exam.  - Continue dilaudid in place of oxycodone given ESRD.  Infected RUE midline catheter: Has been discontinued.  - Completed linezolid x5 days after midline removal per ID recommendation.   Cardiogenic shock due to RV failure Pulmonary hypertension Acute on chronic diastolic congestive heart failure - Off vasopressors since 7/20, continuing midodrine at high dose.  - Planning HD MWF. 3500 goal UF today. BP stable at this time. If hypotensive, recommend small volume boluses. EDW thought to be 135.5kg, remains grossly above that and overloaded. - Pt will need to be able to transfer to HD chair and sit for full treatment prior to discharge.  - Needs advanced heart failure clinic follow up (signed off during this hospitalization)   Atypical atrial flutter: NSR s/p DCCV 7/15.  - Continue amiodarone, eliquis.    ESRD: AVF largely occluded, so ligation not required at this time.  - HD as above per nephrology. Needs to sit up in chair to discharge for outpatient HD. Encouraged frequent PT/OT/mobility per RN.   Hyperkalemia:  - HD today.  OSA: Suspected - Continue CPAP qHS - Outpatient sleep study recommended.    Fibromyalgia - Continue duloxetine.   Anemia of chronic kidney disease: Stable without bleeding.  - ESA per nephrology.   T2DM with hyperglycemia: HbA1c 6.9%.  - Continue SSI, very modest requirements but does get som insulin regularly, so will continue checks and coverage.  - Continue carb-limited diet (in addition to heart healthy/renal)   Weakness:  - Continue PT/OT efforts. PT recommends SNF placement   Goals of care discussion: Due to multiple medical comorbidities and acute ongoing issues.  Palliative care  team was consulted.  Patient wishes to be full code with full scope of treatment, hopeful for improvement and going to rehab.   RN Pressure Injury Documentation: Pressure Injury 07/04/21 Thigh Posterior;Proximal;Right Stage 2 -  Partial thickness  loss of dermis presenting as a shallow open injury with a red, pink wound bed without slough. (Active)  07/04/21 1200  Location: Thigh  Location Orientation: Posterior;Proximal;Right  Staging: Stage 2 -  Partial thickness loss of dermis presenting as a shallow open injury with a red, pink wound bed without slough.  Wound Description (Comments):   Present on Admission: No   Obesity: Estimated body mass index is 50.99 kg/m as calculated from the following:   Height as of this encounter: _0  (1.651 m).   Weight as of this encounter: 139 kg.  DVT prophylaxis: Eliquis Code Status: Full Family Communication: None at bedside Disposition Plan:  Status is: Inpatient  Remains inpatient appropriate because:Inpatient level of care appropriate due to severity of illness. Needs improvement in volume status with HD which will facilitate mobility which will allow her to transfer and remain in chair for duration of hemodialysis sessions as an outpatient.  Dispo: The patient is from: Home              Anticipated d/c is to: SNF              Patient currently is not medically stable to d/c.   Difficult to place patient No  Consultants:  Nephrology ID  Subjective: Had hypotension and associated sinus tachycardia after 4.5L taken off at HD yesterday. This was associated with dyspnea, lightheadedness, no chest pain or palpitations. Not resolved with giving her usual high dose midodrine. Symptoms and vitals improved with low volume NS boluses. She's been taken off oxycodone (which she's been on for months) and reports excruciating pain in the left thigh. No fevers or chills.   Objective: Vitals:   07/22/21 1057 07/22/21 1122 07/22/21 1130 07/22/21 1200  BP: (!) 104/58 (!) 106/57 118/65 129/75  Pulse: (!) 59 (!) 59 60 60  Resp:   16 16  Temp:   97.7 F (36.5 C)   TempSrc:   Oral   SpO2:   100% 100%  Weight:   (!) 139 kg   Height:        Intake/Output Summary (Last 24 hours) at 07/22/2021  1500 Last data filed at 07/22/2021 1130 Gross per 24 hour  Intake 237 ml  Output 3000 ml  Net -2763 ml    Filed Weights   07/22/21 0415 07/22/21 0748 07/22/21 1130  Weight: (!) 144 kg (!) 148.2 kg (!) 139 kg   Gen: 55 y.o. female in no distress Pulm: Nonlabored breathing. Clear anteriorly. CV: Regular rate and rhythm. No murmur, rub, or gallop. No JVD, unchanged significant woody dependent edema. GI: Abdomen soft, non-tender, non-distended, with normoactive bowel sounds.  Ext: Warm, no deformities Skin: No new rashes, lesions or ulcers on visualized skin.Left anteriorly, medial thigh wound uncovered for exam today showing moderate odorless discharge, scant slough which is debrided. No bleeding or purulence.  Neuro: Alert and oriented. No focal neurological deficits. Diffusely very weak. Psych: Judgement and insight appear fair. Mood euthymic & affect congruent. Behavior is appropriate.    Data Reviewed: I have personally reviewed following labs and imaging studies  CBC: Recent Labs  Lab 07/18/21 0047 07/19/21 0205 07/20/21 0031 07/21/21 0120 07/22/21 0104  WBC 8.2 8.6 7.3 7.2 9.3  HGB 10.8* 11.7* 10.6* 11.1* 11.1*  HCT 35.3* 37.9 34.8* 36.7 36.8  MCV 100.3* 99.0 99.7 100.5* 101.4*  PLT 230 237 228 237 953   Basic Metabolic Panel: Recent Labs  Lab 07/16/21 0015 07/17/21 0047 07/18/21 0047 07/19/21 0205 07/21/21 0120 07/22/21 0104  NA 133* 133* 134* 130* 134* 132*  K 4.8 4.7 4.6 5.3* 4.6 6.1*  CL 95* 94* 95* 92* 96* 93*  CO2 _0 GLUCOSE 121* 135* 129* 99 122* 129*  BUN 30* 42* 31* 42* 27* 41*  CREATININE 5.38* 6.42* 5.01* 5.94* 3.98* 4.97*  CALCIUM 9.3 9.3 9.2 9.7 9.5 9.4  MG 2.3 2.5* 2.4 2.5*  --   --   PHOS  --   --  3.5  --  3.7 3.6   GFR: Estimated Creatinine Clearance: 18.3 mL/min (A) (by C-G formula based on SCr of 4.97 mg/dL (H)). Liver Function Tests: Recent Labs  Lab 07/17/21 0047 07/18/21 0047 07/19/21 0205 07/21/21 0120  07/22/21 0104  AST _1 --   --   ALT _2 --   --   ALKPHOS 257* 267* 287*  --   --   BILITOT 0.7 0.6 1.0  --   --   PROT 6.7 7.0 8.0  --   --   ALBUMIN 3.1* 3.1* 3.5 3.2* 3.0*   No results for input(s): LIPASE, AMYLASE in the last 168 hours. No results for input(s): AMMONIA in the last 168 hours. Coagulation Profile: No results for input(s): INR, PROTIME in the last 168 hours.  Cardiac Enzymes: Recent Labs  Lab 07/16/21 0015  CKTOTAL 11*   BNP (last 3 results) No results for input(s): PROBNP in the last 8760 hours. HbA1C: No results for input(s): HGBA1C in the last 72 hours. CBG: Recent Labs  Lab 07/21/21 1120 07/21/21 1606 07/21/21 2120 07/22/21 0615 07/22/21 1204  GLUCAP 180* 113* 152* 116* 95     Recent Results (from the past 240 hour(s))  Culture, blood (x 2)     Status: None   Collection Time: 07/15/21  3:58 AM   Specimen: BLOOD  Result Value Ref Range Status   Specimen Description BLOOD SITE NOT SPECIFIED  Final   Special Requests   Final    BOTTLES DRAWN AEROBIC ONLY Blood Culture adequate volume   Culture   Final    NO GROWTH 5 DAYS Performed at [facility], [place] [address] [place], [place] [address]    Report Status 07/20/2021 FINAL  Final  Culture, blood (x 2)     Status: Abnormal   Collection Time: 07/15/21  3:58 AM   Specimen: BLOOD  Result Value Ref Range Status   Specimen Description BLOOD SITE NOT SPECIFIED  Final   Special Requests   Final    BOTTLES DRAWN AEROBIC ONLY Blood Culture adequate volume   Culture  Setup Time   Final    GRAM POSITIVE COCCI IN CLUSTERS AEROBIC BOTTLE ONLY CRITICAL RESULT CALLED TO, READ BACK BY AND VERIFIED WITH: PHARMD C PIERCE [id number] AT 5 AM BY CM    Culture (A)  Final    STAPHYLOCOCCUS EPIDERMIDIS THE SIGNIFICANCE OF ISOLATING THIS ORGANISM FROM A SINGLE SET OF BLOOD CULTURES WHEN MULTIPLE SETS ARE DRAWN IS UNCERTAIN. PLEASE NOTIFY THE MICROBIOLOGY DEPARTMENT WITHIN ONE WEEK IF SPECIATION  AND SENSITIVITIES ARE REQUIRED. Performed at [facility], [place] [address], [place], [place] [address]    Report Status 07/18/2021 FINAL  Final  Blood Culture ID Panel (  Reflexed)     Status: Abnormal   Collection Time: 07/15/21  3:58 AM  Result Value Ref Range Status   Enterococcus faecalis NOT DETECTED NOT DETECTED Final   Enterococcus Faecium NOT DETECTED NOT DETECTED Final   Listeria monocytogenes NOT DETECTED NOT DETECTED Final   Staphylococcus species DETECTED (A) NOT DETECTED Final    Comment: CRITICAL RESULT CALLED TO, READ BACK BY AND VERIFIED WITH: PHARMD C PIERCE [id number] AT 725 AM BY CM    Staphylococcus aureus (BCID) NOT DETECTED NOT DETECTED Final   Staphylococcus epidermidis DETECTED (A) NOT DETECTED Final    Comment: Methicillin (oxacillin) resistant coagulase negative staphylococcus. Possible blood culture contaminant (unless isolated from more than one blood culture draw or clinical case suggests pathogenicity). No antibiotic treatment is indicated for blood  culture contaminants. CRITICAL RESULT CALLED TO, READ BACK BY AND VERIFIED WITH: PHARMD C PIERCE [id number] AT 53 AM BY CM    Staphylococcus lugdunensis NOT DETECTED NOT DETECTED Final   Streptococcus species NOT DETECTED NOT DETECTED Final   Streptococcus agalactiae NOT DETECTED NOT DETECTED Final   Streptococcus pneumoniae NOT DETECTED NOT DETECTED Final   Streptococcus pyogenes NOT DETECTED NOT DETECTED Final   A.calcoaceticus-baumannii NOT DETECTED NOT DETECTED Final   Bacteroides fragilis NOT DETECTED NOT DETECTED Final   Enterobacterales NOT DETECTED NOT DETECTED Final   Enterobacter cloacae complex NOT DETECTED NOT DETECTED Final   Escherichia coli NOT DETECTED NOT DETECTED Final   Klebsiella aerogenes NOT DETECTED NOT DETECTED Final   Klebsiella oxytoca NOT DETECTED NOT DETECTED Final   Klebsiella pneumoniae NOT DETECTED NOT DETECTED Final   Proteus species NOT DETECTED NOT DETECTED Final   Salmonella  species NOT DETECTED NOT DETECTED Final   Serratia marcescens NOT DETECTED NOT DETECTED Final   Haemophilus influenzae NOT DETECTED NOT DETECTED Final   Neisseria meningitidis NOT DETECTED NOT DETECTED Final   Pseudomonas aeruginosa NOT DETECTED NOT DETECTED Final   Stenotrophomonas maltophilia NOT DETECTED NOT DETECTED Final   Candida albicans NOT DETECTED NOT DETECTED Final   Candida auris NOT DETECTED NOT DETECTED Final   Candida glabrata NOT DETECTED NOT DETECTED Final   Candida krusei NOT DETECTED NOT DETECTED Final   Candida parapsilosis NOT DETECTED NOT DETECTED Final   Candida tropicalis NOT DETECTED NOT DETECTED Final   Cryptococcus neoformans/gattii NOT DETECTED NOT DETECTED Final   Methicillin resistance mecA/C DETECTED (A) NOT DETECTED Final    Comment: CRITICAL RESULT CALLED TO, READ BACK BY AND VERIFIED WITH: PHARMD C PIERCE [id number] AT 725 AM BY CM Performed at [facility], [address], [place], [place] [address]   Aerobic Culture w Gram Stain (superficial specimen)     Status: None   Collection Time: 07/15/21  4:30 AM   Specimen: Wound  Result Value Ref Range Status   Specimen Description WOUND  Final   Special Requests POSTERIOR LEFT LEG  Final   Gram Stain   Final    NO WBC SEEN ABUNDANT GRAM POSITIVE COCCI ABUNDANT GRAM NEGATIVE RODS ABUNDANT GRAM POSITIVE RODS    Culture   Final    ABUNDANT MULTIPLE ORGANISMS PRESENT, NONE PREDOMINANT NO STAPHYLOCOCCUS AUREUS ISOLATED Performed at [facility], [place] [address] [place], [place] [address]    Report Status 07/17/2021 FINAL  Final  Aerobic Culture w Gram Stain (superficial specimen)     Status: None   Collection Time: 07/15/21  4:30 AM   Specimen: Wound  Result Value Ref Range Status   Specimen Description WOUND  Final   Special Requests LEFT FRONT LEG  Final   Gram Stain   Final    NO WBC SEEN ABUNDANT GRAM POSITIVE COCCI FEW GRAM POSITIVE RODS FEW GRAM NEGATIVE RODS    Culture   Final     ABUNDANT MULTIPLE ORGANISMS PRESENT, NONE PREDOMINANT NO STAPHYLOCOCCUS AUREUS ISOLATED Performed at [facility], [place] [address] [place], [place] [address]    Report Status 07/17/2021 FINAL  Final      Radiology Studies: No results found.  Scheduled Meds:  amiodarone  200 mg Oral Daily   apixaban  5 mg Oral BID   calcium acetate  667 mg Oral TID WC   Chlorhexidine Gluconate Cloth  6 each Topical Daily   Chlorhexidine Gluconate Cloth  6 each Topical Q0600   darbepoetin (ARANESP) injection - DIALYSIS  60 mcg Intravenous Q Wed-HD   DULoxetine  20 mg Oral Daily   feeding supplement (NEPRO CARB STEADY)  237 mL Oral TID BM   insulin aspart  0-15 Units Subcutaneous TID WC   insulin aspart  0-5 Units Subcutaneous QHS   mouth rinse  15 mL Mouth Rinse BID   midodrine  15 mg Oral TID WC   multivitamin  1 tablet Oral QHS   polyethylene glycol  17 g Oral Daily   senna  1 tablet Oral Daily   Continuous Infusions:  sodium chloride     promethazine (PHENERGAN) injection (IM or IVPB) Stopped (07/02/21 1831)     LOS: 31 days   Time spent: 25 minutes.  Patrecia Pour, MD [facility] [email or web address] 07/22/2021, 3:00 PM

## 2021-07-22 NOTE — Procedures (Signed)
I was present at this dialysis session. I have reviewed the session itself and made appropriate changes.   Filed Weights   07/21/21 0344 07/22/21 0415 07/22/21 0748  Weight: (!) 142.8 kg (!) 144 kg (!) 148.2 kg    Recent Labs  Lab 07/22/21 0104  NA 132*  K 6.1*  CL 93*  CO2 27  GLUCOSE 129*  BUN 41*  CREATININE 4.97*  CALCIUM 9.4  PHOS 3.6    Recent Labs  Lab 07/20/21 0031 07/21/21 0120 07/22/21 0104  WBC 7.3 7.2 9.3  HGB 10.6* 11.1* 11.1*  HCT 34.8* 36.7 36.8  MCV 99.7 100.5* 101.4*  PLT 228 237 191    Scheduled Meds:  amiodarone  200 mg Oral Daily   apixaban  5 mg Oral BID   calcium acetate  667 mg Oral TID WC   Chlorhexidine Gluconate Cloth  6 each Topical Daily   Chlorhexidine Gluconate Cloth  6 each Topical Q0600   darbepoetin (ARANESP) injection - DIALYSIS  60 mcg Intravenous Q Wed-HD   DULoxetine  20 mg Oral Daily   feeding supplement (NEPRO CARB STEADY)  237 mL Oral TID BM   insulin aspart  0-15 Units Subcutaneous TID WC   insulin aspart  0-5 Units Subcutaneous QHS   mouth rinse  15 mL Mouth Rinse BID   midodrine  15 mg Oral TID WC   multivitamin  1 tablet Oral QHS   polyethylene glycol  17 g Oral Daily   senna  1 tablet Oral Daily   Continuous Infusions:  sodium chloride     promethazine (PHENERGAN) injection (IM or IVPB) Stopped (07/02/21 1831)   PRN Meds:.acetaminophen, dextromethorphan-guaiFENesin, diphenhydrAMINE, docusate sodium, HYDROmorphone, ipratropium-albuterol, ondansetron (ZOFRAN) IV, polyethylene glycol, promethazine (PHENERGAN) injection (IM or IVPB)   Santiago Bumpers,  MD 07/22/2021, 9:55 AM

## 2021-07-23 DIAGNOSIS — L03115 Cellulitis of right lower limb: Secondary | ICD-10-CM | POA: Diagnosis not present

## 2021-07-23 DIAGNOSIS — R570 Cardiogenic shock: Secondary | ICD-10-CM | POA: Diagnosis not present

## 2021-07-23 DIAGNOSIS — I9589 Other hypotension: Secondary | ICD-10-CM | POA: Diagnosis not present

## 2021-07-23 DIAGNOSIS — M79661 Pain in right lower leg: Secondary | ICD-10-CM | POA: Diagnosis not present

## 2021-07-23 LAB — GLUCOSE, CAPILLARY
Glucose-Capillary: 123 mg/dL — ABNORMAL HIGH (ref 70–99)
Glucose-Capillary: 129 mg/dL — ABNORMAL HIGH (ref 70–99)
Glucose-Capillary: 148 mg/dL — ABNORMAL HIGH (ref 70–99)
Glucose-Capillary: 131 mg/dL — ABNORMAL HIGH (ref 70–99)

## 2021-07-23 LAB — PARATHYROID HORMONE, INTACT (NO CA): PTH: 643 pg/mL — ABNORMAL HIGH (ref 15–65)

## 2021-07-23 NOTE — Progress Notes (Signed)
Patient's old unna boot removed, patient's legs and feet washed and dried and ortho tech notified.

## 2021-07-23 NOTE — Progress Notes (Deleted)
Found patient ordered [name] from outside the [facility] cafeteria, she may benefit from the advise of a registered dietician about calories count, renal diet and any advise she may benefit from with regard to her goals of care.

## 2021-07-23 NOTE — Progress Notes (Signed)
Nutrition Follow Up  DOCUMENTATION CODES:   Not applicable  INTERVENTION:   Continue Nepro Shake po TID, each supplement provides 425 kcal and 19 grams protein Continue renal MVI daily   NUTRITION DIAGNOSIS:   Increased nutrient needs related to wound healing as evidenced by estimated needs.  Ongoing  GOAL:   Patient will meet greater than or equal to 90% of their needs  Increased slightly   MONITOR:   PO intake, Supplement acceptance, Weight trends, I & O's, Labs  REASON FOR ASSESSMENT:   Rounds    ASSESSMENT:   Patient with PMH significant for thyroid cancer, CHF, Chiari malformation, ESRD/HD, COPD, CAD, HLD, HTN, DM, and lymphedema. Presents this admission with cardiogenic shock from RV failure.  7/15- s/p DC cardioversion   Appetite picking up per patient. RD observed patient eating egg biscuit at bedside. No meal completions documented since 8/1. Patient is taking Nepro shakes TID. RD encouraged meal intake and continued supplement intake.   EDW: 135 kg  Current weight: 139 kg   Medications: phoslo, aranesp, SS novolog, miralax, senokot Labs: iPTH 643 (H) CBG 95-164  Diet Order:   Diet Order             Diet heart healthy/carb modified Room service appropriate? Yes with Assist; Fluid consistency: Thin; Fluid restriction: 1200 mL Fluid  Diet effective now                   EDUCATION NEEDS:   Education needs have been addressed  Skin:  Skin Assessment: Skin Integrity Issues: Skin Integrity Issues:: Stage II Stage II: R thigh  Last BM:  8/2  Height:   Ht Readings from Last 1 Encounters:  07/16/21 _0  (1.651 m)    Weight:   Wt Readings from Last 1 Encounters:  07/22/21 (!) 139 kg    BMI:  Body mass index is 50.99 kg/m.  Estimated Nutritional Needs:   Kcal:  1800-2000 kcal  Protein:  90-105 grams  Fluid:  >/= 1.8 L/day  Mariana Single MS, RD, LDN, CNSC Clinical Nutrition Pager listed in [place]

## 2021-07-23 NOTE — Progress Notes (Signed)
[facility] Progress Note   Outpatient orders: [facility], Monday Wednesday Friday, 4hr 15 minutes.  350 BFR/500 DFR  1K, 2.5 calcium, 138 sodium, EDW 135.5 kg, 15-gauge needles, machine temp 37  Assessment/ Plan:   1) ESRD  - Eventually need to work toward doing HD up in chair.  Not able to at this time due to deconditioning  -Maintain MWF schedule.  Trying to remove fluid as able.  This was limited by hypotension. More gentle today.    2) Acute on chronic RV failure with shock, pulmonary HTN -CTA r/o'd PE -TTE with normal EF, mod LVH and severe RV dysfunction -RHC with elevated RA pressure (22), no marked change in pressures or CO with AVF occlusion -Her AVF does not appear to be very high flow.  Will  not ligate it. -on midodrine 15 TID -sildenafil stopped in light of ongoing hypotension UF as able with HD-  using low temp- albumin and profile to get volume off-   Eventually will stop using albumin as they cannnot give as OP but trying to maximize while she's in the hospital to remove excess volume. On max midodrine.    3) Volume/ hypertension: prior EDW 135.5kg. UF with HD.  Continues to be overloaded. Removing fluid as able on HD   4) Anemia of Chronic Kidney Disease: Continue ESA as ordered   5) Secondary Hyperparathyroidism/Hyperphosphatemia: sensipar 60 qtx -  phos OK on phoslo - obtain PTH given she has been here a month   6) DM2 with hyperglycemia: mgmt per primary service  Subjective:   The patient tolerated her dialysis yesterday.  No episodes of hypotension after dialysis.  Without complaints   Objective:   BP 114/64 (BP Location: Right Arm)   Pulse 69   Temp 97.8 F (36.6 C) (Oral)   Resp 18   Ht _0  (1.651 m)   Wt (!) 139 kg   SpO2 98%   BMI 50.99 kg/m   Intake/Output Summary (Last 24 hours) at 07/23/2021 0946 Last data filed at 07/22/2021 1800 Gross per 24 hour  Intake 30 ml  Output 3000 ml  Net -2970 ml    Weight change:  4.2 kg  Physical Exam: Gen:nad, sitting up in bed CVS: Normal rate, no audible murmur Resp: normal wob XBW:IOMBT, soft, nt DHR:CBULAGTXMI bilateral LE's w/ chronic skin changes, wound left inner thigh Neuro: awake, alert, no obvious deficits HD access: lt Cim[place] +b/t  Imaging: No results found.  Labs: BMET Recent Labs  Lab 07/17/21 0047 07/18/21 0047 07/19/21 0205 07/21/21 0120 07/22/21 0104  NA 133* 134* 130* 134* 132*  K 4.7 4.6 5.3* 4.6 6.1*  CL 94* 95* 92* 96* 93*  CO2 _1 GLUCOSE 135* 129* 99 122* 129*  BUN 42* 31* 42* 27* 41*  CREATININE 6.42* 5.01* 5.94* 3.98* 4.97*  CALCIUM 9.3 9.2 9.7 9.5 9.4  PHOS  --  3.5  --  3.7 3.6   CBC Recent Labs  Lab 07/19/21 0205 07/20/21 0031 07/21/21 0120 07/22/21 0104  WBC 8.6 7.3 7.2 9.3  HGB 11.7* 10.6* 11.1* 11.1*  HCT 37.9 34.8* 36.7 36.8  MCV 99.0 99.7 100.5* 101.4*  PLT 237 228 237 191    Medications:     amiodarone  200 mg Oral Daily   apixaban  5 mg Oral BID   calcium acetate  667 mg Oral TID WC   Chlorhexidine Gluconate Cloth  6 each Topical Daily   Chlorhexidine Gluconate Cloth  6 each Topical Q0600   darbepoetin (ARANESP) injection - DIALYSIS  60 mcg Intravenous Q Wed-HD   DULoxetine  20 mg Oral Daily   feeding supplement (NEPRO CARB STEADY)  237 mL Oral TID BM   insulin aspart  0-15 Units Subcutaneous TID WC   insulin aspart  0-5 Units Subcutaneous QHS   mouth rinse  15 mL Mouth Rinse BID   midodrine  15 mg Oral TID WC   multivitamin  1 tablet Oral QHS   polyethylene glycol  17 g Oral Daily   senna  1 tablet Oral Daily      Reesa Chew  07/23/2021, 9:46 AM

## 2021-07-23 NOTE — Progress Notes (Signed)
PROGRESS NOTE  [name]  VEL:[id number] DOB: [birth year] DOA: 06/21/2021 PCP: Noreene Larsson, NP   Brief Narrative: 55 year old female with a history of end-stage renal disease on hemodialysis, has had a prolonged hospitalization since she was admitted on 06/21/2021 for cellulitis and concerns for sepsis.  She was treated with antibiotics, but had continued issues with hypotension.  Upon further evaluation, it was felt that her hypotension was more related to cardiogenic shock from RV failure and pulmonary hypertension group 3.  She is being followed by cardiology, nephrology and PCCM.  She is intermittently required vasopressors for hypotension.  She has been off vasopressors since 7/20 and has tolerated hemodialysis.  Currently on midodrine.  Transferred to [facility] service on 7/23.  Overnight on 7/26 patient became tachycardic and hypotensive concerns for left lower extremity cellulitis/sepsis started on IV daptomycin and cefepime.  CT of the thigh was ordered, patient was seen by wound care.  Infectious disease team consulted, remove midline and start linezolid twice daily. Hemodialysis continues to assist with volume removal.   Assessment & Plan: Principal Problem:   Hypotension Active Problems:   Lymphedema of lower extremity   Morbid obesity (HCC)   Obesity hypoventilation syndrome (HCC)   Cellulitis   ESRD on dialysis ([place])   Staphylococcus epidermidis bacteremia   Elevated troponin   Chronic right heart failure (HCC)   Type 2 diabetes mellitus with hyperglycemia (HCC)   Lymphedema   Cardiogenic shock (HCC)   Bilateral calf pain   Pressure injury of skin   Pulmonary hypertension (HCC)  Left lower extremity 5 cm X 11 cm full-thickness wound in medial and posterior upper thigh: Cellulitis thought to have been ruled out per ID. CT only showed chronic inflammation and lymphedema - Continue BID dressing changes.  - Compression stockings, unna boots for lymphedema - No evidence of  infection on exam.  - Continue dilaudid in place of oxycodone given ESRD.  Infected RUE midline catheter: Has been discontinued.  - Completed linezolid x5 days after midline removal per ID recommendation.   Cardiogenic shock due to RV failure Pulmonary hypertension Acute on chronic diastolic congestive heart failure - Off vasopressors since 7/20, continuing midodrine at high dose. If hypotensive, recommend small volume boluses. - Planning HD MWF. Tolerated 3500cc UF 8/3.  EDW thought to be 135.5kg, remains grossly above that and overloaded. - Pt will need to be able to transfer to HD chair and sit for full treatment prior to discharge.  - Needs advanced heart failure clinic follow up (signed off during this hospitalization)   Atypical atrial flutter: NSR s/p DCCV 7/15.  - Continue amiodarone, eliquis.    ESRD: AVF largely occluded, so ligation not required at this time.  - HD as above per nephrology. Needs to sit up in chair to discharge for outpatient HD. Encouraged frequent PT/OT/mobility per RN.   Hyperkalemia:  - Managed by dialysis   OSA: Suspected - Continue CPAP qHS - Outpatient sleep study recommended.    Fibromyalgia - Continue duloxetine.   Anemia of chronic kidney disease: Stable without bleeding.  - ESA per nephrology.   T2DM with hyperglycemia: HbA1c 6.9%.  - Continue SSI, very modest requirements but does get some insulin regularly, so will continue checks and coverage to optimize wound healing.  - Continue carb-limited diet (in addition to heart healthy/renal)   Weakness:  - Continue PT/OT efforts. PT recommends SNF placement. Mobility improving with volume removal and continued therapy.   Goals of care discussion: Due to multiple  medical comorbidities and acute ongoing issues.  Palliative care team was consulted.  Patient wishes to be full code with full scope of treatment, hopeful for improvement and going to rehab.   RN Pressure Injury  Documentation: Pressure Injury 07/04/21 Thigh Posterior;Proximal;Right Stage 2 -  Partial thickness loss of dermis presenting as a shallow open injury with a red, pink wound bed without slough. (Active)  07/04/21 1200  Location: Thigh  Location Orientation: Posterior;Proximal;Right  Staging: Stage 2 -  Partial thickness loss of dermis presenting as a shallow open injury with a red, pink wound bed without slough.  Wound Description (Comments):   Present on Admission: No   Obesity: Estimated body mass index is 50.99 kg/m as calculated from the following:   Height as of this encounter: _0  (1.651 m).   Weight as of this encounter: 139 kg.  DVT prophylaxis: Eliquis Code Status: Full Family Communication: None at bedside Disposition Plan:  Status is: Inpatient  Remains inpatient appropriate because:Inpatient level of care appropriate due to severity of illness. Needs improvement in volume status with HD which will facilitate mobility which will allow her to transfer and remain in chair for duration of hemodialysis sessions as an outpatient.  Dispo: The patient is from: Home              Anticipated d/c is to: SNF              Patient currently is not medically stable to d/c.   Difficult to place patient No  Consultants:  Nephrology ID  Subjective: Pain controlled with dilaudid. Swelling improved "I can see my knee." No chest pain or dyspnea. No hypotension yesterday.  Objective: Vitals:   07/22/21 2335 07/23/21 0322 07/23/21 0731 07/23/21 0803  BP: 127/71 116/63 114/64   Pulse: 78 64 69   Resp: _1 Temp: 98.6 F (37 C) 98.5 F (36.9 C) 97.8 F (36.6 C)   TempSrc: Axillary Axillary Oral   SpO2: 100% 98% 98% 94%  Weight:      Height:        Intake/Output Summary (Last 24 hours) at 07/23/2021 1105 Last data filed at 07/22/2021 1800 Gross per 24 hour  Intake 30 ml  Output 3000 ml  Net -2970 ml    Filed Weights   07/22/21 0415 07/22/21 0748 07/22/21 1130   Weight: (!) 144 kg (!) 148.2 kg (!) 139 kg   Gen: 55 y.o. female in no distress Pulm: Nonlabored breathing. Clear. CV: Regular rate and rhythm. No murmur, rub, or gallop. UTD JVD, stable significant edema, though may be improved over past several days.  GI: Abdomen soft, non-tender, non-distended, with normoactive bowel sounds.  Ext: Warm, no deformities Skin: No new rashes, lesions or ulcers on visualized skin. LLE wound not examined today. Neuro: Alert and oriented. No focal neurological deficits. Psych: Judgement and insight appear fair. Mood euthymic & affect congruent. Behavior is appropriate.    Data Reviewed: I have personally reviewed following labs and imaging studies  CBC: Recent Labs  Lab 07/18/21 0047 07/19/21 0205 07/20/21 0031 07/21/21 0120 07/22/21 0104  WBC 8.2 8.6 7.3 7.2 9.3  HGB 10.8* 11.7* 10.6* 11.1* 11.1*  HCT 35.3* 37.9 34.8* 36.7 36.8  MCV 100.3* 99.0 99.7 100.5* 101.4*  PLT 230 237 228 237 520   Basic Metabolic Panel: Recent Labs  Lab 07/17/21 0047 07/18/21 0047 07/19/21 0205 07/21/21 0120 07/22/21 0104  NA 133* 134* 130* 134* 132*  K 4.7 4.6 5.3* 4.6  6.1*  CL 94* 95* 92* 96* 93*  CO2 _0 GLUCOSE 135* 129* 99 122* 129*  BUN 42* 31* 42* 27* 41*  CREATININE 6.42* 5.01* 5.94* 3.98* 4.97*  CALCIUM 9.3 9.2 9.7 9.5 9.4  MG 2.5* 2.4 2.5*  --   --   PHOS  --  3.5  --  3.7 3.6   GFR: Estimated Creatinine Clearance: 18.3 mL/min (A) (by C-G formula based on SCr of 4.97 mg/dL (H)). Liver Function Tests: Recent Labs  Lab 07/17/21 0047 07/18/21 0047 07/19/21 0205 07/21/21 0120 07/22/21 0104  AST _1 --   --   ALT _2 --   --   ALKPHOS 257* 267* 287*  --   --   BILITOT 0.7 0.6 1.0  --   --   PROT 6.7 7.0 8.0  --   --   ALBUMIN 3.1* 3.1* 3.5 3.2* 3.0*   No results for input(s): LIPASE, AMYLASE in the last 168 hours. No results for input(s): AMMONIA in the last 168 hours. Coagulation Profile: No results for input(s):  INR, PROTIME in the last 168 hours.  Cardiac Enzymes: No results for input(s): CKTOTAL, CKMB, CKMBINDEX, TROPONINI in the last 168 hours.  BNP (last 3 results) No results for input(s): PROBNP in the last 8760 hours. HbA1C: No results for input(s): HGBA1C in the last 72 hours. CBG: Recent Labs  Lab 07/22/21 0615 07/22/21 1204 07/22/21 1651 07/22/21 2103 07/23/21 0606  GLUCAP 116* 95 149* 164* 123*     Recent Results (from the past 240 hour(s))  Culture, blood (x 2)     Status: None   Collection Time: 07/15/21  3:58 AM   Specimen: BLOOD  Result Value Ref Range Status   Specimen Description BLOOD SITE NOT SPECIFIED  Final   Special Requests   Final    BOTTLES DRAWN AEROBIC ONLY Blood Culture adequate volume   Culture   Final    NO GROWTH 5 DAYS Performed at [facility], [address], [place], [place], [place] [address]    Report Status 07/20/2021 FINAL  Final  Culture, blood (x 2)     Status: Abnormal   Collection Time: 07/15/21  3:58 AM   Specimen: BLOOD  Result Value Ref Range Status   Specimen Description BLOOD SITE NOT SPECIFIED  Final   Special Requests   Final    BOTTLES DRAWN AEROBIC ONLY Blood Culture adequate volume   Culture  Setup Time   Final    GRAM POSITIVE COCCI IN CLUSTERS AEROBIC BOTTLE ONLY CRITICAL RESULT CALLED TO, READ BACK BY AND VERIFIED WITH: PHARMD C PIERCE [id number] AT 58 AM BY CM    Culture (A)  Final    STAPHYLOCOCCUS EPIDERMIDIS THE SIGNIFICANCE OF ISOLATING THIS ORGANISM FROM A SINGLE SET OF BLOOD CULTURES WHEN MULTIPLE SETS ARE DRAWN IS UNCERTAIN. PLEASE NOTIFY THE MICROBIOLOGY DEPARTMENT WITHIN ONE WEEK IF SPECIATION AND SENSITIVITIES ARE REQUIRED. Performed at [facility], [place] [address], [place], [place] [address]    Report Status 07/18/2021 FINAL  Final  Blood Culture ID Panel (Reflexed)     Status: Abnormal   Collection Time: 07/15/21  3:58 AM  Result Value Ref Range Status   Enterococcus faecalis NOT DETECTED NOT DETECTED  Final   Enterococcus Faecium NOT DETECTED NOT DETECTED Final   Listeria monocytogenes NOT DETECTED NOT DETECTED Final   Staphylococcus species DETECTED (A) NOT DETECTED Final    Comment: CRITICAL RESULT  CALLED TO, READ BACK BY AND VERIFIED WITH: PHARMD C PIERCE [id number] AT 725 AM BY CM    Staphylococcus aureus (BCID) NOT DETECTED NOT DETECTED Final   Staphylococcus epidermidis DETECTED (A) NOT DETECTED Final    Comment: Methicillin (oxacillin) resistant coagulase negative staphylococcus. Possible blood culture contaminant (unless isolated from more than one blood culture draw or clinical case suggests pathogenicity). No antibiotic treatment is indicated for blood  culture contaminants. CRITICAL RESULT CALLED TO, READ BACK BY AND VERIFIED WITH: PHARMD C PIERCE [id number] AT 45 AM BY CM    Staphylococcus lugdunensis NOT DETECTED NOT DETECTED Final   Streptococcus species NOT DETECTED NOT DETECTED Final   Streptococcus agalactiae NOT DETECTED NOT DETECTED Final   Streptococcus pneumoniae NOT DETECTED NOT DETECTED Final   Streptococcus pyogenes NOT DETECTED NOT DETECTED Final   A.calcoaceticus-baumannii NOT DETECTED NOT DETECTED Final   Bacteroides fragilis NOT DETECTED NOT DETECTED Final   Enterobacterales NOT DETECTED NOT DETECTED Final   Enterobacter cloacae complex NOT DETECTED NOT DETECTED Final   Escherichia coli NOT DETECTED NOT DETECTED Final   Klebsiella aerogenes NOT DETECTED NOT DETECTED Final   Klebsiella oxytoca NOT DETECTED NOT DETECTED Final   Klebsiella pneumoniae NOT DETECTED NOT DETECTED Final   Proteus species NOT DETECTED NOT DETECTED Final   Salmonella species NOT DETECTED NOT DETECTED Final   Serratia marcescens NOT DETECTED NOT DETECTED Final   Haemophilus influenzae NOT DETECTED NOT DETECTED Final   Neisseria meningitidis NOT DETECTED NOT DETECTED Final   Pseudomonas aeruginosa NOT DETECTED NOT DETECTED Final   Stenotrophomonas maltophilia NOT DETECTED NOT DETECTED  Final   Candida albicans NOT DETECTED NOT DETECTED Final   Candida auris NOT DETECTED NOT DETECTED Final   Candida glabrata NOT DETECTED NOT DETECTED Final   Candida krusei NOT DETECTED NOT DETECTED Final   Candida parapsilosis NOT DETECTED NOT DETECTED Final   Candida tropicalis NOT DETECTED NOT DETECTED Final   Cryptococcus neoformans/gattii NOT DETECTED NOT DETECTED Final   Methicillin resistance mecA/C DETECTED (A) NOT DETECTED Final    Comment: CRITICAL RESULT CALLED TO, READ BACK BY AND VERIFIED WITH: PHARMD C PIERCE [id number] AT 725 AM BY CM Performed at [facility], [address] [place], [place] [address]   Aerobic Culture w Gram Stain (superficial specimen)     Status: None   Collection Time: 07/15/21  4:30 AM   Specimen: Wound  Result Value Ref Range Status   Specimen Description WOUND  Final   Special Requests POSTERIOR LEFT LEG  Final   Gram Stain   Final    NO WBC SEEN ABUNDANT GRAM POSITIVE COCCI ABUNDANT GRAM NEGATIVE RODS ABUNDANT GRAM POSITIVE RODS    Culture   Final    ABUNDANT MULTIPLE ORGANISMS PRESENT, NONE PREDOMINANT NO STAPHYLOCOCCUS AUREUS ISOLATED Performed at [facility], [place] [address] [place], [place] [address]    Report Status 07/17/2021 FINAL  Final  Aerobic Culture w Gram Stain (superficial specimen)     Status: None   Collection Time: 07/15/21  4:30 AM   Specimen: Wound  Result Value Ref Range Status   Specimen Description WOUND  Final   Special Requests LEFT FRONT LEG  Final   Gram Stain   Final    NO WBC SEEN ABUNDANT GRAM POSITIVE COCCI FEW GRAM POSITIVE RODS FEW GRAM NEGATIVE RODS    Culture   Final    ABUNDANT MULTIPLE ORGANISMS PRESENT, NONE PREDOMINANT NO STAPHYLOCOCCUS AUREUS ISOLATED Performed at [facility], [address] [place], [place] [address]    Report Status 07/17/2021 FINAL  Final      Radiology Studies: No results found.  Scheduled Meds:  amiodarone  200 mg Oral Daily   apixaban  5 mg  Oral BID   calcium acetate  667 mg Oral TID WC   Chlorhexidine Gluconate Cloth  6 each Topical Daily   Chlorhexidine Gluconate Cloth  6 each Topical Q0600   darbepoetin (ARANESP) injection - DIALYSIS  60 mcg Intravenous Q Wed-HD   DULoxetine  20 mg Oral Daily   feeding supplement (NEPRO CARB STEADY)  237 mL Oral TID BM   insulin aspart  0-15 Units Subcutaneous TID WC   insulin aspart  0-5 Units Subcutaneous QHS   mouth rinse  15 mL Mouth Rinse BID   midodrine  15 mg Oral TID WC   multivitamin  1 tablet Oral QHS   polyethylene glycol  17 g Oral Daily   senna  1 tablet Oral Daily   Continuous Infusions:  sodium chloride     promethazine (PHENERGAN) injection (IM or IVPB) Stopped (07/02/21 1831)     LOS: 32 days   Time spent: 25 minutes.  Lindsay Pour, MD [facility] [email or web address] 07/23/2021, 11:05 AM

## 2021-07-23 NOTE — Care Management Important Message (Signed)
Important Message  Patient Details  Name: [name] MRN: [id number] Date of Birth: January 25, 1966   Medicare Important Message Given:  Yes     Shelda Altes 07/23/2021, 10:22 AM

## 2021-07-23 NOTE — Progress Notes (Signed)
Physical Therapy Treatment Patient Details Name: [name] MRN: [id number] DOB: Dec 29, 1965 Today's Date: 07/23/2021    History of Present Illness 55 y.o. female presenting from [facility] ED 7/3 with lethargy, hypotension and elevated lactic acid after d/c from [facility] earlier that day with septic shock and cellulitis. Patient found to have AMS, septic shock, acute respiratory failure, acute on chronic RV failure. Transferred to ICU on 7/12 due to hypotension. PMHX significant for obesity, DMII, COPD, CAD, lymphedema, ESRD on HD MWF and CHF.    PT Comments    Pt starting to make steady progress toward goals, helped by use of Kreg tilt bed outside of therapy time.  Emphasis on transition to EOB with min guard and pt using rails and other Kreg bed compensations, sitting balance, sit to stand x2 for up to 2+ min in the EVA walker before leg pain became too much to continue.    Follow Up Recommendations  SNF     Equipment Recommendations  Other (comment) (TBA)    Recommendations for Other Services       Precautions / Restrictions Precautions Precautions: Fall Precaution Comments: bil LE edema, L LE/groin wounds Restrictions Weight Bearing Restrictions: No    Mobility  Bed Mobility Overal bed mobility: Needs Assistance Bed Mobility: Supine to Sit;Sit to Supine;Rolling Rolling: Mod assist   Supine to sit: Min guard;HOB elevated Sit to supine: Max assist;+2 for physical assistance;+2 for safety/equipment   General bed mobility comments: HOB elevated with use of rail and increased time pt able to pivot to left side of bed without physical assist. Return to bed with assist to lift legs to surface. Pt able to slide toward [facility] with rails with some assist. Rolling Rolling at [place] with assist to negotiate LEs over one another for placement of pads    Transfers Overall transfer level: Needs assistance Equipment used:  [place] walker) Transfers: Sit to/from Stand Sit to Stand: Mod assist;+2  physical assistance         General transfer comment: Pt able to stand with Mod A x 2 using [place] walker. Pt completed 2 sit to stand trials using bedrail and then chair to simulate pushing up from surfaces. Able to stand for 2 2 minute trials each, no external support needed to maintain standing. Unable to effectively shift weight for taking steps  Ambulation/Gait                 Stairs             Wheelchair Mobility    Modified Rankin (Stroke Patients Only)       Balance Overall balance assessment: Needs assistance Sitting-balance support: No upper extremity supported;Feet unsupported Sitting balance-Leahy Scale: Fair     Standing balance support: Bilateral upper extremity supported Standing balance-Leahy Scale: Poor Standing balance comment: reliant on UE support                            Cognition Arousal/Alertness: Awake/alert Behavior During Therapy: WFL for tasks assessed/performed Overall Cognitive Status: Within Functional Limits for tasks assessed                                 General Comments: very pleasant, motivated to participate      Exercises      General Comments General comments (skin integrity, edema, etc.): vss on RA      Pertinent  Vitals/Pain Pain Assessment: Faces Faces Pain Scale: Hurts even more Pain Location: LEs Pain Descriptors / Indicators: Grimacing;Guarding;Sore;Burning;Aching Pain Intervention(s): Monitored during session    Home Living                      Prior Function            PT Goals (current goals can now be found in the care plan section) Acute Rehab PT Goals Patient Stated Goal: get stronger and return home PT Goal Formulation: With patient Time For Goal Achievement: 08/04/21 Potential to Achieve Goals: Fair Progress towards PT goals: Progressing toward goals    Frequency    Min 2X/week      PT Plan Current plan remains appropriate    Co-evaluation  PT/OT/SLP Co-Evaluation/Treatment: Yes Reason for Co-Treatment: For patient/therapist safety PT goals addressed during session: Mobility/safety with mobility;Strengthening/ROM OT goals addressed during session: ADL's and self-care      AM-PAC PT "6 Clicks" Mobility   Outcome Measure  Help needed turning from your back to your side while in a flat bed without using bedrails?: A Little Help needed moving from lying on your back to sitting on the side of a flat bed without using bedrails?: A Little Help needed moving to and from a bed to a chair (including a wheelchair)?: Total Help needed standing up from a chair using your arms (e.g., wheelchair or bedside chair)?: Total Help needed to walk in hospital room?: Total Help needed climbing 3-5 steps with a railing? : Total 6 Click Score: 10    End of Session   Activity Tolerance: Patient tolerated treatment well Patient left: in bed;with call bell/phone within reach Nurse Communication: Mobility status PT Visit Diagnosis: Other abnormalities of gait and mobility (R26.89);Muscle weakness (generalized) (M62.81);Pain Pain - Right/Left: Left Pain - part of body: Leg     Time: 3533-1740 PT Time Calculation (min) (ACUTE ONLY): 38 min  Charges:  $Therapeutic Exercise: 23-37 mins                     07/23/2021  Ginger Carne., PT Acute Rehabilitation Services [phone]  (pager) [phone]  (office)   Tessie Fass Jailene Cupit 07/23/2021, 2:06 PM

## 2021-07-23 NOTE — Progress Notes (Signed)
Occupational Therapy Treatment Patient Details Name: [name] MRN: [id number] DOB: April 04, 1966 Today's Date: 07/23/2021    History of present illness 55 y.o. female presenting from [facility] ED 7/3 with lethargy, hypotension and elevated lactic acid after d/c from [facility] earlier that day with septic shock and cellulitis. Patient found to have AMS, septic shock, acute respiratory failure, acute on chronic RV failure. Transferred to ICU on 7/12 due to hypotension. PMHX significant for obesity, DMII, COPD, CAD, lymphedema, ESRD on HD MWF and CHF.   OT comments  Pt progressing towards OT goals. Session focused on standing trials with Harmon Pier walker. Pt able to achieve two bouts of standing at Mod A x 2 for 2 mins each. In standing, pt assisted with posterior hygiene and continued weight shifting efforts. Plan to progress standing in prep for ADL transfers within pt tolerance. Pt remains motivated and reports completing exercises outside of therapy. Encouraged pt to continue tilting efforts with mobility tech and nursing staff, as pt endorses feeling this has helped the ability to effectively weightbear through B LE. Continue to recommend SNF rehab.    Follow Up Recommendations  SNF;Supervision/Assistance - 24 hour    Equipment Recommendations  Hospital bed    Recommendations for Other Services      Precautions / Restrictions Precautions Precautions: Fall Precaution Comments: bil LE edema, L LE/groin wounds Restrictions Weight Bearing Restrictions: No       Mobility Bed Mobility Overal bed mobility: Needs Assistance Bed Mobility: Supine to Sit;Sit to Supine;Rolling Rolling: Mod assist   Supine to sit: Min guard;HOB elevated Sit to supine: Max assist;+2 for physical assistance;+2 for safety/equipment   General bed mobility comments: HOB elevated with use of rail and increased time pt able to pivot to left side of bed without physical assist. Return to bed with assist to lift legs to surface. Pt  able to slide toward [facility] with rails with some assist. Rolling Rolling at [place] with assist to negotiate LEs over one another for placement of pads    Transfers Overall transfer level: Needs assistance Equipment used:  Harmon Pier walker) Transfers: Sit to/from Stand Sit to Stand: Mod assist;+2 physical assistance         General transfer comment: Pt able to stand with Mod A x 2 using Harmon Pier walker. Pt completed 2 sit to stand trials using bedrail and then chair to simulate pushing up from surfaces. Able to stand for 2 2 minute trials each, no external support needed to maintain standing. Unable to effectively shift weight for taking steps    Balance Overall balance assessment: Needs assistance Sitting-balance support: No upper extremity supported;Feet unsupported Sitting balance-Leahy Scale: Fair     Standing balance support: Bilateral upper extremity supported Standing balance-Leahy Scale: Poor Standing balance comment: reliant on UE support                           ADL either performed or assessed with clinical judgement   ADL Overall ADL's : Needs assistance/impaired             Lower Body Bathing: Total assistance;Sit to/from stand Lower Body Bathing Details (indicate cue type and reason): Total A for posterior hygiene in standing with Harmon Pier walker. Pt able to maintain standing with DME and without external therapist support Upper Body Dressing : Set up;Bed level Upper Body Dressing Details (indicate cue type and reason): to don new gown  General ADL Comments: Session focused on trial of Eva walker at bedside, attempting weight shifitng in order to prep for ADL transfers     Vision   Vision Assessment?: No apparent visual deficits   Perception     Praxis      Cognition Arousal/Alertness: Awake/alert Behavior During Therapy: WFL for tasks assessed/performed Overall Cognitive Status: Within Functional Limits for tasks assessed                                  General Comments: very pleasant, motivated to participate        Exercises     Shoulder Instructions       General Comments VSS on RA    Pertinent Vitals/ Pain       Pain Assessment: Faces Faces Pain Scale: Hurts even more Pain Location: LEs Pain Descriptors / Indicators: Grimacing;Guarding;Sore;Burning;Aching Pain Intervention(s): Monitored during session;Premedicated before session  Home Living                                          Prior Functioning/Environment              Frequency  Min 2X/week        Progress Toward Goals  OT Goals(current goals can now be found in the care plan section)  Progress towards OT goals: Progressing toward goals  Acute Rehab OT Goals Patient Stated Goal: get stronger and return home OT Goal Formulation: With patient Time For Goal Achievement: 08/06/21 Potential to Achieve Goals: Fair ADL Goals Pt Will Perform Grooming: sitting;with set-up Pt Will Perform Upper Body Dressing: with set-up;sitting Pt Will Transfer to Toilet: with max assist;with +2 assist;stand pivot transfer;bedside commode Pt Will Perform Toileting - Clothing Manipulation and hygiene: with mod assist;sit to/from stand;sitting/lateral leans;with adaptive equipment Pt/caregiver will Perform Home Exercise Program: Increased strength;Both right and left upper extremity;With theraband;Independently;With written HEP provided Additional ADL Goal #1: Pt will tolerate 10 min sitting EOB dynamically with supervision as precursor to ADLs  Plan Discharge plan remains appropriate;Frequency remains appropriate    Co-evaluation    PT/OT/SLP Co-Evaluation/Treatment: Yes Reason for Co-Treatment: For patient/therapist safety;To address functional/ADL transfers   OT goals addressed during session: ADL's and self-care      AM-PAC OT "6 Clicks" Daily Activity     Outcome Measure   Help from another person eating  meals?: None Help from another person taking care of personal grooming?: A Little Help from another person toileting, which includes using toliet, bedpan, or urinal?: Total Help from another person bathing (including washing, rinsing, drying)?: A Lot Help from another person to put on and taking off regular upper body clothing?: A Little Help from another person to put on and taking off regular lower body clothing?: Total 6 Click Score: 14    End of Session Equipment Utilized During Treatment: Other (comment) Harmon Pier walker)  OT Visit Diagnosis: Other abnormalities of gait and mobility (R26.89);Muscle weakness (generalized) (M62.81);Pain Pain - Right/Left: Right Pain - part of body: Leg;Ankle and joints of foot   Activity Tolerance Patient tolerated treatment well;Patient limited by pain   Patient Left in bed;with call bell/phone within reach;with nursing/sitter in room   Nurse Communication Mobility status        Time: 4627-0350 OT Time Calculation (min): 38 min  Charges: OT General Charges $OT Visit: 1  Visit OT Treatments $Therapeutic Activity: 8-22 mins  Malachy Chamber, OTR/L Acute Rehab Services Office: [phone]    [name] 07/23/2021, 1:46 PM

## 2021-07-24 DIAGNOSIS — M79661 Pain in right lower leg: Secondary | ICD-10-CM | POA: Diagnosis not present

## 2021-07-24 DIAGNOSIS — R570 Cardiogenic shock: Secondary | ICD-10-CM | POA: Diagnosis not present

## 2021-07-24 DIAGNOSIS — L03115 Cellulitis of right lower limb: Secondary | ICD-10-CM | POA: Diagnosis not present

## 2021-07-24 DIAGNOSIS — I9589 Other hypotension: Secondary | ICD-10-CM | POA: Diagnosis not present

## 2021-07-24 LAB — RENAL FUNCTION PANEL
Albumin: 3.2 g/dL — ABNORMAL LOW (ref 3.5–5.0)
Anion gap: 13 (ref 5–15)
BUN: 46 mg/dL — ABNORMAL HIGH (ref 6–20)
CO2: 26 mmol/L (ref 22–32)
Calcium: 9.4 mg/dL (ref 8.9–10.3)
Chloride: 94 mmol/L — ABNORMAL LOW (ref 98–111)
Creatinine, Ser: 5.47 mg/dL — ABNORMAL HIGH (ref 0.44–1.00)
GFR, Estimated: 9 mL/min — ABNORMAL LOW (ref >60)
Glucose, Bld: 132 mg/dL — ABNORMAL HIGH (ref 70–99)
Phosphorus: 4 mg/dL (ref 2.5–4.6)
Potassium: 4.3 mmol/L (ref 3.5–5.1)
Sodium: 133 mmol/L — ABNORMAL LOW (ref 135–145)

## 2021-07-24 LAB — GLUCOSE, CAPILLARY
Glucose-Capillary: 109 mg/dL — ABNORMAL HIGH (ref 70–99)
Glucose-Capillary: 108 mg/dL — ABNORMAL HIGH (ref 70–99)
Glucose-Capillary: 131 mg/dL — ABNORMAL HIGH (ref 70–99)
Glucose-Capillary: 128 mg/dL — ABNORMAL HIGH (ref 70–99)

## 2021-07-24 LAB — CBC
HCT: 34.9 % — ABNORMAL LOW (ref 36.0–46.0)
Hemoglobin: 10.7 g/dL — ABNORMAL LOW (ref 12.0–15.0)
MCH: 30.4 pg (ref 26.0–34.0)
MCHC: 30.7 g/dL (ref 30.0–36.0)
MCV: 99.1 fL (ref 80.0–100.0)
Platelets: 171 10*3/uL (ref 150–400)
RBC: 3.52 MIL/uL — ABNORMAL LOW (ref 3.87–5.11)
RDW: 17.8 % — ABNORMAL HIGH (ref 11.5–15.5)
WBC: 8.5 10*3/uL (ref 4.0–10.5)
nRBC: 0 % (ref 0.0–0.2)

## 2021-07-24 LAB — HEPATITIS B SURFACE ANTIGEN: Hepatitis B Surface Ag: NONREACTIVE

## 2021-07-24 LAB — HEPATITIS B CORE ANTIBODY, TOTAL: Hep B Core Total Ab: NONREACTIVE

## 2021-07-24 MED ORDER — ALBUMIN HUMAN 25 % IV SOLN
INTRAVENOUS | Status: AC
  Administered 2021-07-24: 25 g
  Filled 2021-07-24: qty 100

## 2021-07-24 MED ORDER — MIDODRINE HCL 5 MG PO TABS
ORAL_TABLET | ORAL | Status: AC
  Administered 2021-07-24: 5 mg
  Filled 2021-07-24: qty 2

## 2021-07-24 MED ORDER — MIDODRINE HCL 5 MG PO TABS
ORAL_TABLET | ORAL | Status: AC
  Administered 2021-07-24: 5 mg
  Filled 2021-07-24: qty 1

## 2021-07-24 MED ORDER — MIDODRINE HCL 5 MG PO TABS
ORAL_TABLET | ORAL | Status: AC
  Administered 2021-07-24: 15 mg via ORAL
  Filled 2021-07-24: qty 2

## 2021-07-24 NOTE — Progress Notes (Signed)
PROGRESS NOTE  [name]  EYC:[id number] DOB: [birth year] DOA: 06/21/2021 PCP: Noreene Larsson, NP   Brief Narrative: 55 year old female with a history of end-stage renal disease on hemodialysis, has had a prolonged hospitalization since she was admitted on 06/21/2021 for cellulitis and concerns for sepsis.  She was treated with antibiotics, but had continued issues with hypotension.  Upon further evaluation, it was felt that her hypotension was more related to cardiogenic shock from RV failure and pulmonary hypertension group 3.  She is being followed by cardiology, nephrology and PCCM.  She is intermittently required vasopressors for hypotension.  She has been off vasopressors since 7/20 and has tolerated hemodialysis.  Currently on midodrine.  Transferred to [facility] service on 7/23.  Overnight on 7/26 patient became tachycardic and hypotensive concerns for left lower extremity cellulitis/sepsis started on IV daptomycin and cefepime.  CT of the thigh was ordered, patient was seen by wound care.  Infectious disease team consulted, remove midline and start linezolid twice daily. Hemodialysis continues to assist with volume removal.   Assessment & Plan: Principal Problem:   Hypotension Active Problems:   Lymphedema of lower extremity   Morbid obesity (HCC)   Obesity hypoventilation syndrome (HCC)   Cellulitis   ESRD on dialysis ([place])   Staphylococcus epidermidis bacteremia   Elevated troponin   Chronic right heart failure (HCC)   Type 2 diabetes mellitus with hyperglycemia (HCC)   Lymphedema   Cardiogenic shock (HCC)   Bilateral calf pain   Pressure injury of skin   Pulmonary hypertension (HCC)  Cardiogenic shock due to RV failure Pulmonary hypertension Acute on chronic diastolic congestive heart failure - Off vasopressors since 7/20, continuing midodrine at high dose. If hypotensive, recommend small volume boluses. - Planning HD MWF. Aiming for 3500 UF 8/5.  EDW thought to be 135.5kg,  weight here has trended as high as 147kg, coming down to 139 currently. - Pt will need to be able to transfer to HD chair and sit for full treatment prior to discharge. Making progress with PT/OT. - Needs advanced heart failure clinic follow up (signed off during this hospitalization)  Left lower extremity 5 cm X 11 cm full-thickness wound in medial and posterior upper thigh: Cellulitis thought to have been ruled out per ID. CT only showed chronic inflammation and lymphedema - Continue BID dressing changes.  - Compression stockings, unna boots for lymphedema - No evidence of infection on exam.  - Continue dilaudid in place of oxycodone given ESRD.  Infected RUE midline catheter: Has been discontinued.  - Completed linezolid x5 days after midline removal per ID recommendation.   Atypical atrial flutter: NSR s/p DCCV 7/15.  - Continue amiodarone, eliquis.    ESRD: AVF largely occluded, so ligation not required at this time.  - HD as above per nephrology. Needs to sit up in chair to discharge for outpatient HD. Encouraged frequent PT/OT/mobility per RN.   Hyperkalemia:  - Managed by dialysis   OSA: Suspected - Continue CPAP qHS - Outpatient sleep study recommended.    Fibromyalgia - Continue duloxetine.   Anemia of chronic kidney disease: Stable without bleeding.  - ESA per nephrology.  - With current protracted hospitalization and suspected prolongation from this point, I'm hoping we can decrease frequency of blood draws.  T2DM with hyperglycemia: HbA1c 6.9%.  - Continue SSI, very modest requirements but does get some insulin regularly, so will continue checks and coverage to optimize wound healing.  - Continue carb-limited diet (in addition to heart  healthy/renal)   Weakness:  - Continue PT/OT efforts. PT recommends SNF placement. Mobility improving with volume removal and continued therapy.   Goals of care discussion: Due to multiple medical comorbidities and acute ongoing  issues.  Palliative care team was consulted.  Patient wishes to be full code with full scope of treatment, hopeful for improvement and going to rehab.   RN Pressure Injury Documentation: Pressure Injury 07/04/21 Thigh Posterior;Proximal;Right Stage 2 -  Partial thickness loss of dermis presenting as a shallow open injury with a red, pink wound bed without slough. (Active)  07/04/21 1200  Location: Thigh  Location Orientation: Posterior;Proximal;Right  Staging: Stage 2 -  Partial thickness loss of dermis presenting as a shallow open injury with a red, pink wound bed without slough.  Wound Description (Comments):   Present on Admission: No   Obesity: Estimated body mass index is 50.99 kg/m as calculated from the following:   Height as of this encounter: _0  (1.651 m).   Weight as of this encounter: 139 kg.  DVT prophylaxis: Eliquis Code Status: Full Family Communication: None at bedside Disposition Plan:  Status is: Inpatient  Remains inpatient appropriate because:Inpatient level of care appropriate due to severity of illness. Needs improvement in volume status with HD which will facilitate mobility which will allow her to transfer and remain in chair for duration of hemodialysis sessions as an outpatient.  Dispo: The patient is from: Home              Anticipated d/c is to: SNF              Patient currently is not medically stable to d/c.   Difficult to place patient No  Consultants:  Nephrology ID  Subjective: Feeling stronger, worked with PT yesterday and "progressing toward goals." Encouraged by her progress. Pain is controlled, no dyspnea or chest pain or fever or bleeding.   Objective: Vitals:   07/23/21 2256 07/23/21 2323 07/24/21 0356 07/24/21 0800  BP: (!) 106/58  (!) 97/59 127/71  Pulse: 76 74 65 70  Resp: _1 Temp: 97.9 F (36.6 C)  98.2 F (36.8 C) 97.8 F (36.6 C)  TempSrc: Oral  Oral Oral  SpO2: 90% 92% 96% 97%  Weight:   (!) 139 kg   Height:        No intake or output data in the 24 hours ending 07/24/21 1038   Filed Weights   07/22/21 0748 07/22/21 1130 07/24/21 0356  Weight: (!) 148.2 kg (!) 139 kg (!) 139 kg   Gen: 54 y.o. female in no distress Pulm: Nonlabored breathing, clear.. CV: Regular rate and rhythm. No murmur, rub, or gallop. UTD, no definite JVD, stable woody dependent edema. GI: Abdomen soft, non-tender, non-distended, with normoactive bowel sounds.  Ext: Warm, no deformities. LUA accessed. Skin: No new rashes, lesions or ulcers on visualized skin. Neuro: Alert and oriented. No focal neurological deficits. Psych: Judgement and insight appear fair. Mood euthymic & affect congruent. Behavior is appropriate.    Data Reviewed: I have personally reviewed following labs and imaging studies  CBC: Recent Labs  Lab 07/18/21 0047 07/19/21 0205 07/20/21 0031 07/21/21 0120 07/22/21 0104  WBC 8.2 8.6 7.3 7.2 9.3  HGB 10.8* 11.7* 10.6* 11.1* 11.1*  HCT 35.3* 37.9 34.8* 36.7 36.8  MCV 100.3* 99.0 99.7 100.5* 101.4*  PLT 230 237 228 237 979   Basic Metabolic Panel: Recent Labs  Lab 07/18/21 0047 07/19/21 0205 07/21/21 0120 07/22/21 0104  NA  134* 130* 134* 132*  K 4.6 5.3* 4.6 6.1*  CL 95* 92* 96* 93*  CO2 _0 GLUCOSE 129* 99 122* 129*  BUN 31* 42* 27* 41*  CREATININE 5.01* 5.94* 3.98* 4.97*  CALCIUM 9.2 9.7 9.5 9.4  MG 2.4 2.5*  --   --   PHOS 3.5  --  3.7 3.6   GFR: Estimated Creatinine Clearance: 18.3 mL/min (A) (by C-G formula based on SCr of 4.97 mg/dL (H)). Liver Function Tests: Recent Labs  Lab 07/18/21 0047 07/19/21 0205 07/21/21 0120 07/22/21 0104  AST 17 20  --   --   ALT 14 15  --   --   ALKPHOS 267* 287*  --   --   BILITOT 0.6 1.0  --   --   PROT 7.0 8.0  --   --   ALBUMIN 3.1* 3.5 3.2* 3.0*   No results for input(s): LIPASE, AMYLASE in the last 168 hours. No results for input(s): AMMONIA in the last 168 hours. Coagulation Profile: No results for input(s): INR, PROTIME  in the last 168 hours.  Cardiac Enzymes: No results for input(s): CKTOTAL, CKMB, CKMBINDEX, TROPONINI in the last 168 hours.  BNP (last 3 results) No results for input(s): PROBNP in the last 8760 hours. HbA1C: No results for input(s): HGBA1C in the last 72 hours. CBG: Recent Labs  Lab 07/23/21 0606 07/23/21 1134 07/23/21 1613 07/23/21 2142 07/24/21 0623  GLUCAP 123* 129* 148* 131* 109*     Recent Results (from the past 240 hour(s))  Culture, blood (x 2)     Status: None   Collection Time: 07/15/21  3:58 AM   Specimen: BLOOD  Result Value Ref Range Status   Specimen Description BLOOD SITE NOT SPECIFIED  Final   Special Requests   Final    BOTTLES DRAWN AEROBIC ONLY Blood Culture adequate volume   Culture   Final    NO GROWTH 5 DAYS Performed at [facility], [place] [address], [place], [place] [address]    Report Status 07/20/2021 FINAL  Final  Culture, blood (x 2)     Status: Abnormal   Collection Time: 07/15/21  3:58 AM   Specimen: BLOOD  Result Value Ref Range Status   Specimen Description BLOOD SITE NOT SPECIFIED  Final   Special Requests   Final    BOTTLES DRAWN AEROBIC ONLY Blood Culture adequate volume   Culture  Setup Time   Final    GRAM POSITIVE COCCI IN CLUSTERS AEROBIC BOTTLE ONLY CRITICAL RESULT CALLED TO, READ BACK BY AND VERIFIED WITH: PHARMD C PIERCE [id number] AT 25 AM BY CM    Culture (A)  Final    STAPHYLOCOCCUS EPIDERMIDIS THE SIGNIFICANCE OF ISOLATING THIS ORGANISM FROM A SINGLE SET OF BLOOD CULTURES WHEN MULTIPLE SETS ARE DRAWN IS UNCERTAIN. PLEASE NOTIFY THE MICROBIOLOGY DEPARTMENT WITHIN ONE WEEK IF SPECIATION AND SENSITIVITIES ARE REQUIRED. Performed at [facility], [place] [address] [place], [place] [address]    Report Status 07/18/2021 FINAL  Final  Blood Culture ID Panel (Reflexed)     Status: Abnormal   Collection Time: 07/15/21  3:58 AM  Result Value Ref Range Status   Enterococcus faecalis NOT DETECTED NOT DETECTED Final    Enterococcus Faecium NOT DETECTED NOT DETECTED Final   Listeria monocytogenes NOT DETECTED NOT DETECTED Final   Staphylococcus species DETECTED (A) NOT DETECTED Final    Comment: CRITICAL RESULT CALLED TO, READ BACK BY AND VERIFIED WITH: PHARMD  C PIERCE [id number] AT 725 AM BY CM    Staphylococcus aureus (BCID) NOT DETECTED NOT DETECTED Final   Staphylococcus epidermidis DETECTED (A) NOT DETECTED Final    Comment: Methicillin (oxacillin) resistant coagulase negative staphylococcus. Possible blood culture contaminant (unless isolated from more than one blood culture draw or clinical case suggests pathogenicity). No antibiotic treatment is indicated for blood  culture contaminants. CRITICAL RESULT CALLED TO, READ BACK BY AND VERIFIED WITH: PHARMD C PIERCE [id number] AT 25 AM BY CM    Staphylococcus lugdunensis NOT DETECTED NOT DETECTED Final   Streptococcus species NOT DETECTED NOT DETECTED Final   Streptococcus agalactiae NOT DETECTED NOT DETECTED Final   Streptococcus pneumoniae NOT DETECTED NOT DETECTED Final   Streptococcus pyogenes NOT DETECTED NOT DETECTED Final   A.calcoaceticus-baumannii NOT DETECTED NOT DETECTED Final   Bacteroides fragilis NOT DETECTED NOT DETECTED Final   Enterobacterales NOT DETECTED NOT DETECTED Final   Enterobacter cloacae complex NOT DETECTED NOT DETECTED Final   Escherichia coli NOT DETECTED NOT DETECTED Final   Klebsiella aerogenes NOT DETECTED NOT DETECTED Final   Klebsiella oxytoca NOT DETECTED NOT DETECTED Final   Klebsiella pneumoniae NOT DETECTED NOT DETECTED Final   Proteus species NOT DETECTED NOT DETECTED Final   Salmonella species NOT DETECTED NOT DETECTED Final   Serratia marcescens NOT DETECTED NOT DETECTED Final   Haemophilus influenzae NOT DETECTED NOT DETECTED Final   Neisseria meningitidis NOT DETECTED NOT DETECTED Final   Pseudomonas aeruginosa NOT DETECTED NOT DETECTED Final   Stenotrophomonas maltophilia NOT DETECTED NOT DETECTED Final    Candida albicans NOT DETECTED NOT DETECTED Final   Candida auris NOT DETECTED NOT DETECTED Final   Candida glabrata NOT DETECTED NOT DETECTED Final   Candida krusei NOT DETECTED NOT DETECTED Final   Candida parapsilosis NOT DETECTED NOT DETECTED Final   Candida tropicalis NOT DETECTED NOT DETECTED Final   Cryptococcus neoformans/gattii NOT DETECTED NOT DETECTED Final   Methicillin resistance mecA/C DETECTED (A) NOT DETECTED Final    Comment: CRITICAL RESULT CALLED TO, READ BACK BY AND VERIFIED WITH: PHARMD C PIERCE [id number] AT 725 AM BY CM Performed at [facility], [address] [place], [place] [address]   Aerobic Culture w Gram Stain (superficial specimen)     Status: None   Collection Time: 07/15/21  4:30 AM   Specimen: Wound  Result Value Ref Range Status   Specimen Description WOUND  Final   Special Requests POSTERIOR LEFT LEG  Final   Gram Stain   Final    NO WBC SEEN ABUNDANT GRAM POSITIVE COCCI ABUNDANT GRAM NEGATIVE RODS ABUNDANT GRAM POSITIVE RODS    Culture   Final    ABUNDANT MULTIPLE ORGANISMS PRESENT, NONE PREDOMINANT NO STAPHYLOCOCCUS AUREUS ISOLATED Performed at [facility], [place] [address] [place], [place] [address]    Report Status 07/17/2021 FINAL  Final  Aerobic Culture w Gram Stain (superficial specimen)     Status: None   Collection Time: 07/15/21  4:30 AM   Specimen: Wound  Result Value Ref Range Status   Specimen Description WOUND  Final   Special Requests LEFT FRONT LEG  Final   Gram Stain   Final    NO WBC SEEN ABUNDANT GRAM POSITIVE COCCI FEW GRAM POSITIVE RODS FEW GRAM NEGATIVE RODS    Culture   Final    ABUNDANT MULTIPLE ORGANISMS PRESENT, NONE PREDOMINANT NO STAPHYLOCOCCUS AUREUS ISOLATED Performed at [facility], [place] [address], [place], [place] [address]  Report Status 07/17/2021 FINAL  Final      Radiology Studies: No results found.  Scheduled Meds:  amiodarone  200 mg Oral Daily   apixaban  5 mg Oral BID    calcium acetate  667 mg Oral TID WC   Chlorhexidine Gluconate Cloth  6 each Topical Daily   Chlorhexidine Gluconate Cloth  6 each Topical Q0600   darbepoetin (ARANESP) injection - DIALYSIS  60 mcg Intravenous Q Wed-HD   DULoxetine  20 mg Oral Daily   feeding supplement (NEPRO CARB STEADY)  237 mL Oral TID BM   insulin aspart  0-15 Units Subcutaneous TID WC   insulin aspart  0-5 Units Subcutaneous QHS   mouth rinse  15 mL Mouth Rinse BID   midodrine       midodrine  15 mg Oral TID WC   multivitamin  1 tablet Oral QHS   polyethylene glycol  17 g Oral Daily   senna  1 tablet Oral Daily   Continuous Infusions:  sodium chloride     promethazine (PHENERGAN) injection (IM or IVPB) Stopped (07/02/21 1831)     LOS: 33 days   Time spent: 25 minutes.  Patrecia Pour, MD [facility] [email or web address] 07/24/2021, 10:38 AM

## 2021-07-24 NOTE — Procedures (Signed)
Patient seen on Hemodialysis. BP (!) 97/58 (BP Location: Right Arm)   Pulse 75   Temp 97.6 F (36.4 C) (Oral)   Resp 18   Ht _0  (1.651 m)   Wt (!) 146.6 kg   SpO2 96%   BMI 53.78 kg/m   QB 400, UF goal 3L Tolerating treatment without complaints at this time.  Elmarie Shiley MD [facility]. Office # [phone] Pager # [phone] 11:36 AM

## 2021-07-24 NOTE — Progress Notes (Signed)
Patient ID: [name], female   DOB: 1966-03-29, 55 y.o.   MRN: [id number] [facility] Progress Note   Assessment/ Plan:   1.  Acute exacerbation of chronic right ventricular heart failure with pulmonary hypertension/shock: Remains on midodrine for hypotension and now off sildenafil.  Continue efforts at ultrafiltration/hemodialysis as able to for UF/volume management. 2. ESRD: On a Monday/Wednesday/Friday schedule and working towards undertaking hemodialysis in recliner which has been limited by her significant deconditioning 3. Anemia: Without overt blood loss, continue ESA with surveillance of hemoglobin/hematocrit. 4. CKD-MBD: Continue Sensipar for PTH control.  Calcium and phosphorus levels currently at acceptable levels with ongoing calcium acetate for phosphorus binding. 5. Nutrition: Continue renal diet with oral nutritional supplementation 6. Hypertension: Blood pressures currently at goal, monitor with hemodialysis/ultrafiltration.  EDW 135.5 kg prior to admission.  Subjective:   Reports to be feeling better- low BP at HD noted but she is asymptomatic   Objective:   BP 127/71 (BP Location: Right Wrist)   Pulse 70   Temp 97.8 F (36.6 C) (Oral)   Resp 20   Ht _0  (1.651 m)   Wt (!) 139 kg   SpO2 97%   BMI 50.99 kg/m   Physical Exam: Gen: Appears comfortable resting in dialysis CVS: Pulse regular rhythm, normal rate, S1 and S2 normal Resp: Clear to auscultation bilaterally, no rales/rhonchi Abd: Soft, obese, nontender Ext: Chronic lymphedema with venous stasis changes bilaterally.  Left inner thigh wound noted.  Left RCF cannulated at dialysis.  Labs: BMET Recent Labs  Lab 07/18/21 0047 07/19/21 0205 07/21/21 0120 07/22/21 0104  NA 134* 130* 134* 132*  K 4.6 5.3* 4.6 6.1*  CL 95* 92* 96* 93*  CO2 _1 GLUCOSE 129* 99 122* 129*  BUN 31* 42* 27* 41*  CREATININE 5.01* 5.94* 3.98* 4.97*  CALCIUM 9.2 9.7 9.5 9.4  PHOS 3.5  --  3.7 3.6    CBC Recent Labs  Lab 07/19/21 0205 07/20/21 0031 07/21/21 0120 07/22/21 0104  WBC 8.6 7.3 7.2 9.3  HGB 11.7* 10.6* 11.1* 11.1*  HCT 37.9 34.8* 36.7 36.8  MCV 99.0 99.7 100.5* 101.4*  PLT 237 228 237 191      Medications:     amiodarone  200 mg Oral Daily   apixaban  5 mg Oral BID   calcium acetate  667 mg Oral TID WC   Chlorhexidine Gluconate Cloth  6 each Topical Daily   Chlorhexidine Gluconate Cloth  6 each Topical Q0600   darbepoetin (ARANESP) injection - DIALYSIS  60 mcg Intravenous Q Wed-HD   DULoxetine  20 mg Oral Daily   feeding supplement (NEPRO CARB STEADY)  237 mL Oral TID BM   insulin aspart  0-15 Units Subcutaneous TID WC   insulin aspart  0-5 Units Subcutaneous QHS   mouth rinse  15 mL Mouth Rinse BID   midodrine       midodrine  15 mg Oral TID WC   multivitamin  1 tablet Oral QHS   polyethylene glycol  17 g Oral Daily   senna  1 tablet Oral Daily   Elmarie Shiley, MD 07/24/2021, 10:15 AM

## 2021-07-24 NOTE — Progress Notes (Signed)
OT Cancellation Note  Patient Details Name: [name] MRN: [id number] DOB: March 03, 1966   Cancelled Treatment:    Reason Eval/Treat Not Completed: Patient at procedure or test/ unavailable Pt off unit for HD this AM. Will follow-up for OT session as schedule permits  [name] 07/24/2021, 9:41 AM

## 2021-07-24 NOTE — Progress Notes (Signed)
Orthopedic Tech Progress Note Patient Details:  [name] 14-Mar-1966 [id number]  Ortho Devices Type of Ortho Device: Haematologist Ortho Device/Splint Location: BLE Ortho Device/Splint Interventions: Ordered, Application, Adjustment   Post Interventions Patient Tolerated: Well Instructions Provided: Care of device, Poper ambulation with device  [name] 07/24/2021, 12:20 AM

## 2021-07-24 NOTE — Plan of Care (Signed)

## 2021-07-25 DIAGNOSIS — I9589 Other hypotension: Secondary | ICD-10-CM | POA: Diagnosis not present

## 2021-07-25 DIAGNOSIS — E861 Hypovolemia: Secondary | ICD-10-CM | POA: Diagnosis not present

## 2021-07-25 LAB — GLUCOSE, CAPILLARY
Glucose-Capillary: 120 mg/dL — ABNORMAL HIGH (ref 70–99)
Glucose-Capillary: 197 mg/dL — ABNORMAL HIGH (ref 70–99)
Glucose-Capillary: 107 mg/dL — ABNORMAL HIGH (ref 70–99)
Glucose-Capillary: 138 mg/dL — ABNORMAL HIGH (ref 70–99)

## 2021-07-25 LAB — HEPATITIS B SURFACE ANTIBODY, QUANTITATIVE: Hep B S AB Quant (Post): 3.6 m[IU]/mL — ABNORMAL LOW (ref >9.9)

## 2021-07-25 MED ORDER — CINACALCET HCL 30 MG PO TABS
60.0000 mg | ORAL_TABLET | ORAL | Status: DC
  Administered 2021-07-27 – 2021-08-10 (×6): 60 mg via ORAL
  Filled 2021-07-25 (×7): qty 2

## 2021-07-25 MED ORDER — HYDROMORPHONE HCL 2 MG PO TABS
1.0000 mg | ORAL_TABLET | ORAL | Status: DC | PRN
  Administered 2021-07-25 – 2021-08-03 (×24): 2 mg via ORAL
  Filled 2021-07-25 (×25): qty 1

## 2021-07-25 MED ORDER — CINACALCET HCL 30 MG PO TABS: 60.0000 mg | ORAL_TABLET | ORAL | Status: DC | PRN

## 2021-07-25 NOTE — Progress Notes (Signed)
Changed dressing to left upper/inner thigh.  Cleansed with NS, applied xeroform and ABD bandage. Pt tolerated procedure well.Pt resting with call bell within reach.  Will continue to monitor.

## 2021-07-25 NOTE — Progress Notes (Signed)
Patient ID: [name], female   DOB: 1966-10-03, 55 y.o.   MRN: [id number] [facility] Progress Note   Assessment/ Plan:   1.  Acute exacerbation of chronic right ventricular heart failure with pulmonary hypertension/shock: Sildenafil was discontinued and she remains on high-dose midodrine for blood pressure support.  Continue volume management with hemodialysis and ultrafiltration. 2. ESRD: On a Monday/Wednesday/Friday schedule and underwent hemodialysis yesterday without problems.  She is working towards undertaking hemodialysis in recliner which has been limited by her significant deconditioning at this time. 3. Anemia: Without overt blood loss, continue ESA with surveillance of hemoglobin/hematocrit. 4. CKD-MBD: Continue Sensipar for PTH control.  Calcium and phosphorus levels currently at acceptable levels with ongoing calcium acetate for phosphorus binding. 5. Nutrition: Continue renal diet with oral nutritional supplementation 6. Hypertension: Blood pressures currently at goal, monitor with hemodialysis/ultrafiltration.  EDW 135.5 kg prior to admission.  Subjective:   Tolerated dialysis yesterday without problems and denies any chest pain or shortness of breath.   Objective:   BP 113/63 (BP Location: Right Arm)   Pulse 70   Temp 98.3 F (36.8 C) (Oral)   Resp 17   Ht 5' 5" (1.651 m)   Wt (!) 143.8 kg   SpO2 97%   BMI 52.76 kg/m   Physical Exam: Gen: Appears comfortable resting in dialysis CVS: Pulse regular rhythm, normal rate, S1 and S2 normal Resp: Clear to auscultation bilaterally, no rales/rhonchi Abd: Soft, obese, nontender Ext: Chronic lymphedema with venous stasis changes bilaterally.  Left inner thigh wound noted.  Left RCF with intact dressings.  Labs: BMET Recent Labs  Lab 07/19/21 0205 07/21/21 0120 07/22/21 0104 07/24/21 0838  NA 130* 134* 132* 133*  K 5.3* 4.6 6.1* 4.3  CL 92* 96* 93* 94*  CO2 _0 GLUCOSE 99 122* 129* 132*   BUN 42* 27* 41* 46*  CREATININE 5.94* 3.98* 4.97* 5.47*  CALCIUM 9.7 9.5 9.4 9.4  PHOS  --  3.7 3.6 4.0   CBC Recent Labs  Lab 07/20/21 0031 07/21/21 0120 07/22/21 0104 07/24/21 0838  WBC 7.3 7.2 9.3 8.5  HGB 10.6* 11.1* 11.1* 10.7*  HCT 34.8* 36.7 36.8 34.9*  MCV 99.7 100.5* 101.4* 99.1  PLT 228 237 191 171      Medications:     amiodarone  200 mg Oral Daily   apixaban  5 mg Oral BID   calcium acetate  667 mg Oral TID WC   Chlorhexidine Gluconate Cloth  6 each Topical Daily   Chlorhexidine Gluconate Cloth  6 each Topical Q0600   darbepoetin (ARANESP) injection - DIALYSIS  60 mcg Intravenous Q Wed-HD   DULoxetine  20 mg Oral Daily   feeding supplement (NEPRO CARB STEADY)  237 mL Oral TID BM   insulin aspart  0-15 Units Subcutaneous TID WC   insulin aspart  0-5 Units Subcutaneous QHS   mouth rinse  15 mL Mouth Rinse BID   midodrine  15 mg Oral TID WC   multivitamin  1 tablet Oral QHS   polyethylene glycol  17 g Oral Daily   senna  1 tablet Oral Daily   Elmarie Shiley, MD 07/25/2021, 9:53 AM

## 2021-07-25 NOTE — Progress Notes (Signed)
Mobility Specialist: Progress Note   07/25/21 1719  Mobility  Activity Stood at bedside  Level of Assistance +2 (takes two people)  Assistive Device Four wheel walker (EVA)  Minutes Stood 3 minutes  Mobility  (Stood to be cleaned up)  Mobility Response Tolerated well  Mobility performed by Mobility specialist;Nurse tech (NT Mas[place])  $Mobility charge 1 Mobility   Pre-Mobility: 75 HR, 100% SpO2 Post-Mobility: 76 HR  Pt independent with bed mobility and required +2 modA to stand from EOB to EVA walker. Pt asx while standing. Pt was able to stand for three minutes before needing to sit. After getting pt back in bed pt requested to tilt and stand for a second bout. Pt was able to tilt to 65 degrees for roughly 1-2 minutes. Pt back in normal bed position with NT present in the room.   [facility] Tynasia Mccaul Mobility Specialist Mobility Specialist Phone: [phone]

## 2021-07-25 NOTE — Progress Notes (Signed)
PROGRESS NOTE  [name]  PYK:[id number] DOB: 1966/12/13 DOA: 06/21/2021 PCP: Noreene Larsson, NP   Brief Narrative: 55 year old female with a history of end-stage renal disease on hemodialysis, has had a prolonged hospitalization since she was admitted on 06/21/2021 for cellulitis and concerns for sepsis.  She was treated with antibiotics, but had continued issues with hypotension.  Upon further evaluation, it was felt that her hypotension was more related to cardiogenic shock from RV failure and pulmonary hypertension group 3.  She is being followed by cardiology, nephrology and PCCM.  She is intermittently required vasopressors for hypotension.  She has been off vasopressors since 7/20 and has tolerated hemodialysis.  Currently on midodrine.  Transferred to [facility] service on 7/23.  Overnight on 7/26 patient became tachycardic and hypotensive concerns for left lower extremity cellulitis/sepsis started on IV daptomycin and cefepime.  CT of the thigh was ordered, patient was seen by wound care. Infectious disease team consulted, remove midline and start linezolid twice daily. Hemodialysis continues to assist with volume removal.   Assessment & Plan: Principal Problem:   Hypotension Active Problems:   Lymphedema of lower extremity   Morbid obesity (HCC)   Obesity hypoventilation syndrome (HCC)   Cellulitis   ESRD on dialysis ([place])   Staphylococcus epidermidis bacteremia   Elevated troponin   Chronic right heart failure (HCC)   Type 2 diabetes mellitus with hyperglycemia (HCC)   Lymphedema   Cardiogenic shock (HCC)   Bilateral calf pain   Pressure injury of skin   Pulmonary hypertension (HCC)  Cardiogenic shock due to RV failure Pulmonary hypertension Acute on chronic diastolic congestive heart failure - Off vasopressors since 7/20, continuing midodrine at high dose. If hypotensive, recommend small volume boluses. - Planning HD MWF - defer to nephrology - Pt will need to be able to  transfer to HD chair and sit for full treatment prior to discharge. Making progress with PT/OT. - Needs advanced heart failure clinic follow up (signed off during this hospitalization) post discharge  Left lower extremity 5 cm X 11 cm full-thickness wound in medial and posterior upper thigh: Cellulitis thought to have been ruled out per ID. CT only showed chronic inflammation and lymphedema - Continue BID dressing changes.  - Compression stockings, unna boots for lymphedema - No evidence of infection on exam.  - Continue dilaudid in place of oxycodone given ESRD - continue to wean as tolerated  Infected RUE midline catheter: Has been discontinued.  - Completed linezolid x5 days after midline removal per ID recommendation.   Atypical atrial flutter: NSR s/p DCCV 7/15.  - Continue amiodarone, eliquis.    ESRD: AVF largely occluded, so ligation not required at this time.  - HD as above per nephrology. Needs to sit up in chair to discharge for outpatient HD. Encouraged frequent PT/OT/mobility per RN.   Hyperkalemia:  - Managed by dialysis   OSA: Suspected - Continue CPAP qHS - Outpatient sleep study recommended.    Fibromyalgia - Continue duloxetine.   Anemia of chronic kidney disease: Stable without bleeding.  - ESA per nephrology.  - With current protracted hospitalization and suspected prolongation from this point, I'm hoping we can decrease frequency of blood draws.  T2DM with hyperglycemia: HbA1c 6.9%.  - Continue SSI, very modest requirements but does get some insulin regularly, so will continue checks and coverage to optimize wound healing.  - Continue carb-limited diet (in addition to heart healthy/renal)   Weakness:  - Continue PT/OT efforts. PT recommends SNF placement.  Mobility improving with volume removal and continued therapy.   Goals of care discussion: Due to multiple medical comorbidities and acute ongoing issues.  Palliative care team was consulted.  Patient  wishes to be full code with full scope of treatment, hopeful for improvement and going to rehab.   RN Pressure Injury Documentation: Pressure Injury 07/04/21 Thigh Posterior;Proximal;Right Stage 2 -  Partial thickness loss of dermis presenting as a shallow open injury with a red, pink wound bed without slough. (Active)  07/04/21 1200  Location: Thigh  Location Orientation: Posterior;Proximal;Right  Staging: Stage 2 -  Partial thickness loss of dermis presenting as a shallow open injury with a red, pink wound bed without slough.  Wound Description (Comments):   Present on Admission: No   Obesity: Estimated body mass index is 52.76 kg/m as calculated from the following:   Height as of this encounter: _0  (1.651 m).   Weight as of this encounter: 143.8 kg.  DVT prophylaxis: Eliquis Code Status: Full Family Communication: None at bedside Disposition Plan:  Status is: Inpatient  Remains inpatient appropriate because:Inpatient level of care appropriate due to severity of illness. Needs improvement in volume status with HD which will facilitate mobility which will allow her to transfer and remain in chair for duration of hemodialysis sessions as an outpatient.  Dispo: The patient is from: Home              Anticipated d/c is to: SNF              Patient currently is medically stable to d/c. Awaiting safe dispo location   Difficult to place patient No  Consultants:  Nephrology ID  Subjective: No acute issues/events overnight - focused on working with PT today  Objective: Vitals:   07/24/21 1956 07/24/21 2300 07/24/21 2331 07/25/21 0352  BP: (!) 104/58  (!) 97/54 93/61  Pulse: 69  69 67  Resp: _1 Temp: 98.4 F (36.9 C)  98 F (36.7 C) 98.6 F (37 C)  TempSrc: Oral  Oral Oral  SpO2: 97% 99% 97% 100%  Weight:    (!) 143.8 kg  Height:        Intake/Output Summary (Last 24 hours) at 07/25/2021 0736 Last data filed at 07/24/2021 1336 Gross per 24 hour  Intake --   Output 2800 ml  Net -2800 ml     Filed Weights   07/24/21 0950 07/24/21 1445 07/25/21 0352  Weight: (!) 146.6 kg (!) 143.8 kg (!) 143.8 kg   Gen: 55 y.o. female in no distress Pulm: Nonlabored breathing, clear.. CV: Regular rate and rhythm. No murmur, rub, or gallop. UTD, no definite JVD, stable woody dependent edema. GI: Abdomen soft, non-tender, non-distended, with normoactive bowel sounds.  Ext: Warm, no deformities. LUA accessed. Skin: No new rashes, lesions or ulcers on visualized skin. Neuro: Alert and oriented. No focal neurological deficits. Psych: Judgement and insight appear fair. Mood euthymic & affect congruent. Behavior is appropriate.    Data Reviewed: I have personally reviewed following labs and imaging studies  CBC: Recent Labs  Lab 07/19/21 0205 07/20/21 0031 07/21/21 0120 07/22/21 0104 07/24/21 0838  WBC 8.6 7.3 7.2 9.3 8.5  HGB 11.7* 10.6* 11.1* 11.1* 10.7*  HCT 37.9 34.8* 36.7 36.8 34.9*  MCV 99.0 99.7 100.5* 101.4* 99.1  PLT 237 228 237 191 527    Basic Metabolic Panel: Recent Labs  Lab 07/19/21 0205 07/21/21 0120 07/22/21 0104 07/24/21 0838  NA 130* 134* 132* 133*  K 5.3* 4.6 6.1* 4.3  CL 92* 96* 93* 94*  CO2 _0 GLUCOSE 99 122* 129* 132*  BUN 42* 27* 41* 46*  CREATININE 5.94* 3.98* 4.97* 5.47*  CALCIUM 9.7 9.5 9.4 9.4  MG 2.5*  --   --   --   PHOS  --  3.7 3.6 4.0    GFR: Estimated Creatinine Clearance: 17 mL/min (A) (by C-G formula based on SCr of 5.47 mg/dL (H)). Liver Function Tests: Recent Labs  Lab 07/19/21 0205 07/21/21 0120 07/22/21 0104 07/24/21 0838  AST 20  --   --   --   ALT 15  --   --   --   ALKPHOS 287*  --   --   --   BILITOT 1.0  --   --   --   PROT 8.0  --   --   --   ALBUMIN 3.5 3.2* 3.0* 3.2*    No results for input(s): LIPASE, AMYLASE in the last 168 hours. No results for input(s): AMMONIA in the last 168 hours. Coagulation Profile: No results for input(s): INR, PROTIME in the last 168  hours.  Cardiac Enzymes: No results for input(s): CKTOTAL, CKMB, CKMBINDEX, TROPONINI in the last 168 hours.  BNP (last 3 results) No results for input(s): PROBNP in the last 8760 hours. HbA1C: No results for input(s): HGBA1C in the last 72 hours. CBG: Recent Labs  Lab 07/24/21 0623 07/24/21 1450 07/24/21 1847 07/24/21 2140 07/25/21 0611  GLUCAP 109* 108* 131* 128* 120*      No results found for this or any previous visit (from the past 240 hour(s)).     Radiology Studies: No results found.  Scheduled Meds:  amiodarone  200 mg Oral Daily   apixaban  5 mg Oral BID   calcium acetate  667 mg Oral TID WC   Chlorhexidine Gluconate Cloth  6 each Topical Daily   Chlorhexidine Gluconate Cloth  6 each Topical Q0600   darbepoetin (ARANESP) injection - DIALYSIS  60 mcg Intravenous Q Wed-HD   DULoxetine  20 mg Oral Daily   feeding supplement (NEPRO CARB STEADY)  237 mL Oral TID BM   insulin aspart  0-15 Units Subcutaneous TID WC   insulin aspart  0-5 Units Subcutaneous QHS   mouth rinse  15 mL Mouth Rinse BID   midodrine  15 mg Oral TID WC   multivitamin  1 tablet Oral QHS   polyethylene glycol  17 g Oral Daily   senna  1 tablet Oral Daily   Continuous Infusions:  sodium chloride     promethazine (PHENERGAN) injection (IM or IVPB) Stopped (07/02/21 1831)     LOS: 34 days   Time spent: 25 minutes.  Little Ishikawa, MD [facility] [email or web address] 07/25/2021, 7:36 AM

## 2021-07-26 DIAGNOSIS — E861 Hypovolemia: Secondary | ICD-10-CM | POA: Diagnosis not present

## 2021-07-26 DIAGNOSIS — I9589 Other hypotension: Secondary | ICD-10-CM | POA: Diagnosis not present

## 2021-07-26 LAB — GLUCOSE, CAPILLARY
Glucose-Capillary: 120 mg/dL — ABNORMAL HIGH (ref 70–99)
Glucose-Capillary: 141 mg/dL — ABNORMAL HIGH (ref 70–99)
Glucose-Capillary: 134 mg/dL — ABNORMAL HIGH (ref 70–99)
Glucose-Capillary: 125 mg/dL — ABNORMAL HIGH (ref 70–99)

## 2021-07-26 NOTE — Progress Notes (Signed)
PROGRESS NOTE  [name]  QMV:[id number] DOB: October 19, 1966 DOA: 06/21/2021 PCP: Noreene Larsson, NP   Brief Narrative: 55 year old female with a history of end-stage renal disease on hemodialysis, has had a prolonged hospitalization since she was admitted on 06/21/2021 for cellulitis and concerns for sepsis.  She was treated with antibiotics, but had continued issues with hypotension.  Upon further evaluation, it was felt that her hypotension was more related to cardiogenic shock from RV failure and pulmonary hypertension group 3.  She is being followed by cardiology, nephrology and PCCM.  She is intermittently required vasopressors for hypotension.  She has been off vasopressors since 7/20 and has tolerated hemodialysis.  Currently on midodrine.  Transferred to [facility] service on 7/23.  Overnight on 7/26 patient became tachycardic and hypotensive concerns for left lower extremity cellulitis/sepsis started on IV daptomycin and cefepime.  CT of the thigh was ordered, patient was seen by wound care. Infectious disease team consulted, remove midline and start linezolid twice daily. Hemodialysis continues to assist with volume removal.   Assessment & Plan: Principal Problem:   Hypotension Active Problems:   Lymphedema of lower extremity   Morbid obesity (HCC)   Obesity hypoventilation syndrome (HCC)   Cellulitis   ESRD on dialysis ([place])   Staphylococcus epidermidis bacteremia   Elevated troponin   Chronic right heart failure (HCC)   Type 2 diabetes mellitus with hyperglycemia (HCC)   Lymphedema   Cardiogenic shock (HCC)   Bilateral calf pain   Pressure injury of skin   Pulmonary hypertension (HCC)  Cardiogenic shock due to RV failure Pulmonary hypertension Acute on chronic diastolic congestive heart failure - Off vasopressors since 7/20, continuing midodrine at high dose. If hypotensive, recommend small volume boluses. - Continue HD MWF - defer to nephrology - Pt will need to be able to  transfer to HD chair and sit for full treatment prior to discharge. Making progress with PT/OT. - Needs advanced heart failure clinic follow up (signed off during this hospitalization) post discharge  Left lower extremity 5 cm X 11 cm full-thickness wound in medial and posterior upper thigh: Cellulitis thought to have been ruled out per ID. CT only showed chronic inflammation and lymphedema - Continue BID dressing changes.  - Compression stockings, unna boots for lymphedema - No evidence of infection on exam.  - Continue dilaudid in place of oxycodone given ESRD - continue to wean as tolerated  Infected RUE midline catheter: Has been discontinued.  - Completed linezolid x5 days after midline removal per ID recommendation.   Atypical atrial flutter: NSR s/p DCCV 7/15.  - Continue amiodarone, eliquis.    ESRD: AVF largely occluded, so ligation not required at this time.  - HD as above per nephrology. Needs to sit up in chair to discharge for outpatient HD. Encouraged frequent PT/OT/mobility per RN.   Hyperkalemia:  - Managed by dialysis   OSA: Suspected - Continue CPAP qHS - Outpatient sleep study recommended.    Fibromyalgia - Continue duloxetine.   Anemia of chronic kidney disease: Stable without bleeding.  - ESA per nephrology.  - With current protracted hospitalization and suspected prolongation from this point, I'm hoping we can decrease frequency of blood draws.  T2DM with hyperglycemia: HbA1c 6.9%.  - Continue SSI, very modest requirements but does get some insulin regularly, so will continue checks and coverage to optimize wound healing.  - Continue carb-limited diet (in addition to heart healthy/renal)   Weakness:  - Continue PT/OT efforts. PT recommends SNF placement.  Mobility improving with volume removal and continued therapy.   Goals of care discussion: Due to multiple medical comorbidities and acute ongoing issues.  Palliative care team was consulted.  Patient  wishes to be full code with full scope of treatment, hopeful for improvement and going to rehab.   RN Pressure Injury Documentation: Pressure Injury 07/04/21 Thigh Posterior;Proximal;Right Stage 2 -  Partial thickness loss of dermis presenting as a shallow open injury with a red, pink wound bed without slough. (Active)  07/04/21 1200  Location: Thigh  Location Orientation: Posterior;Proximal;Right  Staging: Stage 2 -  Partial thickness loss of dermis presenting as a shallow open injury with a red, pink wound bed without slough.  Wound Description (Comments):   Present on Admission: No   Obesity: Estimated body mass index is 53.16 kg/m as calculated from the following:   Height as of this encounter: _0  (1.651 m).   Weight as of this encounter: 144.9 kg.  DVT prophylaxis: Eliquis Code Status: Full Family Communication: None at bedside Disposition Plan:  Status is: Inpatient  Remains inpatient appropriate because:Inpatient level of care appropriate due to severity of illness. Needs improvement in volume status with HD which will facilitate mobility which will allow her to transfer and remain in chair for duration of hemodialysis sessions as an outpatient.  Dispo: The patient is from: Home              Anticipated d/c is to: SNF              Patient currently is medically stable to d/c. Awaiting safe dispo location   Difficult to place patient No  Consultants:  Nephrology ID  Subjective: No acute issues/events overnight - focused on working with PT today  Objective: Vitals:   07/25/21 2314 07/25/21 2318 07/26/21 0000 07/26/21 0449  BP: (!) 106/58   125/68  Pulse: 65   65  Resp: _1 Temp: 98.8 F (37.1 C)   98.8 F (37.1 C)  TempSrc: Oral   Oral  SpO2: 94% 92%  96%  Weight:    (!) 144.9 kg  Height:        Intake/Output Summary (Last 24 hours) at 07/26/2021 0748 Last data filed at 07/26/2021 0600 Gross per 24 hour  Intake 360 ml  Output 0 ml  Net 360 ml       Filed Weights   07/24/21 1445 07/25/21 0352 07/26/21 0449  Weight: (!) 143.8 kg (!) 143.8 kg (!) 144.9 kg   Gen: 55 y.o. female in no distress Pulm: Nonlabored breathing, clear.. CV: Regular rate and rhythm. No murmur, rub, or gallop. UTD, no definite JVD, stable woody dependent edema. GI: Abdomen soft, non-tender, non-distended, with normoactive bowel sounds.  Ext: Warm, no deformities. LUA accessed. Skin: No new rashes, lesions or ulcers on visualized skin. Neuro: Alert and oriented. No focal neurological deficits. Psych: Judgement and insight appear fair. Mood euthymic & affect congruent. Behavior is appropriate.    Data Reviewed: I have personally reviewed following labs and imaging studies  CBC: Recent Labs  Lab 07/20/21 0031 07/21/21 0120 07/22/21 0104 07/24/21 0838  WBC 7.3 7.2 9.3 8.5  HGB 10.6* 11.1* 11.1* 10.7*  HCT 34.8* 36.7 36.8 34.9*  MCV 99.7 100.5* 101.4* 99.1  PLT 228 237 191 034    Basic Metabolic Panel: Recent Labs  Lab 07/21/21 0120 07/22/21 0104 07/24/21 0838  NA 134* 132* 133*  K 4.6 6.1* 4.3  CL 96* 93* 94*  CO2 27  27 26  GLUCOSE 122* 129* 132*  BUN 27* 41* 46*  CREATININE 3.98* 4.97* 5.47*  CALCIUM 9.5 9.4 9.4  PHOS 3.7 3.6 4.0    GFR: Estimated Creatinine Clearance: 17.1 mL/min (A) (by C-G formula based on SCr of 5.47 mg/dL (H)). Liver Function Tests: Recent Labs  Lab 07/21/21 0120 07/22/21 0104 07/24/21 0838  ALBUMIN 3.2* 3.0* 3.2*    No results for input(s): LIPASE, AMYLASE in the last 168 hours. No results for input(s): AMMONIA in the last 168 hours. Coagulation Profile: No results for input(s): INR, PROTIME in the last 168 hours.  Cardiac Enzymes: No results for input(s): CKTOTAL, CKMB, CKMBINDEX, TROPONINI in the last 168 hours.  BNP (last 3 results) No results for input(s): PROBNP in the last 8760 hours. HbA1C: No results for input(s): HGBA1C in the last 72 hours. CBG: Recent Labs  Lab 07/25/21 0611  07/25/21 1152 07/25/21 1723 07/25/21 2009 07/26/21 0612  GLUCAP 120* 197* 107* 138* 120*      No results found for this or any previous visit (from the past 240 hour(s)).     Radiology Studies: No results found.  Scheduled Meds:  amiodarone  200 mg Oral Daily   apixaban  5 mg Oral BID   calcium acetate  667 mg Oral TID WC   Chlorhexidine Gluconate Cloth  6 each Topical Daily   Chlorhexidine Gluconate Cloth  6 each Topical Q0600   [START ON 07/27/2021] cinacalcet  60 mg Oral Q M,W,F-HD   darbepoetin (ARANESP) injection - DIALYSIS  60 mcg Intravenous Q Wed-HD   DULoxetine  20 mg Oral Daily   feeding supplement (NEPRO CARB STEADY)  237 mL Oral TID BM   insulin aspart  0-15 Units Subcutaneous TID WC   insulin aspart  0-5 Units Subcutaneous QHS   mouth rinse  15 mL Mouth Rinse BID   midodrine  15 mg Oral TID WC   multivitamin  1 tablet Oral QHS   polyethylene glycol  17 g Oral Daily   senna  1 tablet Oral Daily   Continuous Infusions:  sodium chloride     promethazine (PHENERGAN) injection (IM or IVPB) Stopped (07/02/21 1831)     LOS: 35 days   Time spent: 25 minutes.  Little Ishikawa, MD [facility] [email or web address] 07/26/2021, 7:48 AM

## 2021-07-26 NOTE — Progress Notes (Signed)
Patient ID: [name], female   DOB: 14-Apr-1966, 55 y.o.   MRN: [id number] [facility] Progress Note   Assessment/ Plan:   1.  Acute exacerbation of chronic right ventricular heart failure with pulmonary hypertension/shock: Sildenafil was discontinued and she remains on high-dose midodrine for blood pressure support.  Continue volume management with hemodialysis and ultrafiltration. 2. ESRD: On a Monday/Wednesday/Friday schedule and I will order for her next hemodialysis again tomorrow (keep systolic blood pressure >66).  At this time, her deconditioning limits her ability to undertake dialysis in recliner and as such, cannot go back to her outpatient dialysis unit until she has significant improvement of muscular strength/gait. 3. Anemia: Without overt blood loss, continue ESA with surveillance of hemoglobin/hematocrit. 4. CKD-MBD: Continue Sensipar for PTH control.  Calcium and phosphorus levels currently at acceptable levels with ongoing calcium acetate for phosphorus binding. 5. Nutrition: Continue renal diet with oral nutritional supplementation 6. Hypertension: Blood pressures currently at goal, monitor with hemodialysis/ultrafiltration.  EDW 135.5 kg prior to admission and currently about 10 kg over-continue efforts at UF with HD.  Subjective:   Denies any chest pain or shortness of breath and was able to stand for about 5 minutes yesterday   Objective:   BP 126/69 (BP Location: Right Arm)   Pulse 74   Temp 98.2 F (36.8 C) (Oral)   Resp 19   Ht _0  (1.651 m)   Wt (!) 144.9 kg   SpO2 95%   BMI 53.16 kg/m   Physical Exam: Gen: Comfortably resting in bed, awakens easily to conversation CVS: Pulse regular rhythm, normal rate, S1 and S2 normal Resp: Clear to auscultation bilaterally, no rales/rhonchi Abd: Soft, obese, nontender Ext: Chronic lymphedema with venous stasis changes bilaterally.  Left inner thigh wound noted.  Left RCF with intact  dressings.  Labs: BMET Recent Labs  Lab 07/21/21 0120 07/22/21 0104 07/24/21 0838  NA 134* 132* 133*  K 4.6 6.1* 4.3  CL 96* 93* 94*  CO2 _1 GLUCOSE 122* 129* 132*  BUN 27* 41* 46*  CREATININE 3.98* 4.97* 5.47*  CALCIUM 9.5 9.4 9.4  PHOS 3.7 3.6 4.0   CBC Recent Labs  Lab 07/20/21 0031 07/21/21 0120 07/22/21 0104 07/24/21 0838  WBC 7.3 7.2 9.3 8.5  HGB 10.6* 11.1* 11.1* 10.7*  HCT 34.8* 36.7 36.8 34.9*  MCV 99.7 100.5* 101.4* 99.1  PLT 228 237 191 171      Medications:     amiodarone  200 mg Oral Daily   apixaban  5 mg Oral BID   calcium acetate  667 mg Oral TID WC   Chlorhexidine Gluconate Cloth  6 each Topical Daily   Chlorhexidine Gluconate Cloth  6 each Topical Q0600   [START ON 07/27/2021] cinacalcet  60 mg Oral Q M,W,F-HD   darbepoetin (ARANESP) injection - DIALYSIS  60 mcg Intravenous Q Wed-HD   DULoxetine  20 mg Oral Daily   feeding supplement (NEPRO CARB STEADY)  237 mL Oral TID BM   insulin aspart  0-15 Units Subcutaneous TID WC   insulin aspart  0-5 Units Subcutaneous QHS   mouth rinse  15 mL Mouth Rinse BID   midodrine  15 mg Oral TID WC   multivitamin  1 tablet Oral QHS   polyethylene glycol  17 g Oral Daily   senna  1 tablet Oral Daily   Elmarie Shiley, MD 07/26/2021, 9:39 AM

## 2021-07-26 NOTE — Progress Notes (Signed)
Mobility Specialist: Progress Note   07/26/21 1558  Mobility  Activity Stood at bedside  Level of Assistance Moderate assist, patient does 50-74%  Assistive Device Four wheel walker (EVA)  Mobility  (Stood at bedside)  Mobility Response Tolerated well  Mobility performed by Mobility specialist  $Mobility charge 1 Mobility   Pre-Mobility: 66 HR Post-Mobility: 84 HR  Pt independent with bed mobility and required modA to stand from EOB to EVA walker. Pt was able to stand for 30-45 seconds and performed 10 partial squats before returning to bed. Pt c/o feeling more lethargic today than yesterday. Pt back in bed with assistance from NT and myself. Pt has call bell and phone in her lap.   [facility] [name] Mobility Specialist Mobility Specialist Phone: [phone]

## 2021-07-27 DIAGNOSIS — I9589 Other hypotension: Secondary | ICD-10-CM | POA: Diagnosis not present

## 2021-07-27 DIAGNOSIS — E861 Hypovolemia: Secondary | ICD-10-CM | POA: Diagnosis not present

## 2021-07-27 LAB — CBC
HCT: 32.4 % — ABNORMAL LOW (ref 36.0–46.0)
Hemoglobin: 10.4 g/dL — ABNORMAL LOW (ref 12.0–15.0)
MCH: 31.2 pg (ref 26.0–34.0)
MCHC: 32.1 g/dL (ref 30.0–36.0)
MCV: 97.3 fL (ref 80.0–100.0)
Platelets: 157 10*3/uL (ref 150–400)
RBC: 3.33 MIL/uL — ABNORMAL LOW (ref 3.87–5.11)
RDW: 18 % — ABNORMAL HIGH (ref 11.5–15.5)
WBC: 9.6 10*3/uL (ref 4.0–10.5)
nRBC: 0 % (ref 0.0–0.2)

## 2021-07-27 LAB — RENAL FUNCTION PANEL
Albumin: 3.5 g/dL (ref 3.5–5.0)
Anion gap: 12 (ref 5–15)
BUN: 54 mg/dL — ABNORMAL HIGH (ref 6–20)
CO2: 28 mmol/L (ref 22–32)
Calcium: 9.6 mg/dL (ref 8.9–10.3)
Chloride: 93 mmol/L — ABNORMAL LOW (ref 98–111)
Creatinine, Ser: 6.64 mg/dL — ABNORMAL HIGH (ref 0.44–1.00)
GFR, Estimated: 7 mL/min — ABNORMAL LOW (ref >60)
Glucose, Bld: 117 mg/dL — ABNORMAL HIGH (ref 70–99)
Phosphorus: 3.6 mg/dL (ref 2.5–4.6)
Potassium: 4.3 mmol/L (ref 3.5–5.1)
Sodium: 133 mmol/L — ABNORMAL LOW (ref 135–145)

## 2021-07-27 LAB — GLUCOSE, CAPILLARY
Glucose-Capillary: 113 mg/dL — ABNORMAL HIGH (ref 70–99)
Glucose-Capillary: 95 mg/dL (ref 70–99)
Glucose-Capillary: 147 mg/dL — ABNORMAL HIGH (ref 70–99)
Glucose-Capillary: 114 mg/dL — ABNORMAL HIGH (ref 70–99)

## 2021-07-27 LAB — HEPATITIS B SURFACE ANTIGEN: Hepatitis B Surface Ag: NONREACTIVE

## 2021-07-27 LAB — HEPATITIS B CORE ANTIBODY, TOTAL: Hep B Core Total Ab: NONREACTIVE

## 2021-07-27 MED ORDER — HEPARIN SODIUM (PORCINE) 1000 UNIT/ML IJ SOLN
INTRAMUSCULAR | Status: AC
  Administered 2021-07-27: 5800 [IU] via INTRAVENOUS_CENTRAL
  Filled 2021-07-27: qty 6

## 2021-07-27 MED ORDER — ALBUMIN HUMAN 25 % IV SOLN
INTRAVENOUS | Status: AC
  Administered 2021-07-27: 25 g
  Filled 2021-07-27: qty 100

## 2021-07-27 MED ORDER — ALBUMIN HUMAN 25 % IV SOLN
INTRAVENOUS | Status: AC
  Filled 2021-07-27: qty 100

## 2021-07-27 MED ORDER — ALBUMIN HUMAN 25 % IV SOLN
12.5000 g | Freq: Once | INTRAVENOUS | Status: AC
  Filled 2021-07-27: qty 50

## 2021-07-27 MED ORDER — HEPARIN SODIUM (PORCINE) 1000 UNIT/ML DIALYSIS
40.0000 [IU]/kg | INTRAMUSCULAR | Status: DC | PRN
  Filled 2021-07-27 (×2): qty 6

## 2021-07-27 MED ORDER — MIDODRINE HCL 5 MG PO TABS
ORAL_TABLET | ORAL | Status: AC
  Administered 2021-07-27: 15 mg via ORAL
  Filled 2021-07-27: qty 3

## 2021-07-27 NOTE — Progress Notes (Signed)
Spoke with Tax inspector regarding dialysis arrangements. Per Dr. Royce Macadamia patient not able to sit up in a recliner and tolerate outpatient dialysis. Social Worker will reassess discharge plan and notify renal coordinator as needed.

## 2021-07-27 NOTE — Progress Notes (Signed)
Mobility Specialist: Progress Note   07/27/21 1704  Mobility  Activity  (Tilt Bed Standing)  Level of Assistance Standby assist, set-up cues, supervision of patient - no hands on  Assistive Device  (Tilt Bed)  Mobility Sit up in bed/chair position for meals  Mobility Response Tolerated well  Mobility performed by Mobility specialist  $Mobility charge 1 Mobility   Pre-Mobility: 110 HR, 97/54 BP, 98% SpO2 During Mobility: 139 HR Post-Mobility: 123 HR, 94/71 BP, 99% SpO2  Pt stood using tilt bed up to 65 degrees. Pt c/o feeling a little light headed but did not progress worse during session, otherwise asx. Pt was able to perform 10 partial squats when standing with assistance from BUE on bed rails. Pt back in seated position in the bed with call bell and phone in her lap. Pt requesting pain medication, RN notified.   [facility] Tadeo Besecker Mobility Specialist Mobility Specialist Phone: [phone]

## 2021-07-27 NOTE — Progress Notes (Deleted)
Pt has home CPAP. No assistance needed from RT.

## 2021-07-27 NOTE — Progress Notes (Signed)
Patient ID: [name], female   DOB: January 13, 1966, 55 y.o.   MRN: [id number] [facility] Progress Note   Assessment/ Plan:   1.  Acute exacerbation of chronic right ventricular heart failure with pulmonary hypertension/shock: Sildenafil was discontinued and she remains on high-dose midodrine for blood pressure support.  Continue volume management with hemodialysis and ultrafiltration.  2. ESRD:  - Continue HD per a Monday/Wednesday/Friday schedule.  (keep systolic blood pressure >83).   - At this time, her deconditioning limits her ability to undertake dialysis in recliner and as such, cannot go back to her outpatient dialysis unit until she has significant improvement of muscular strength/gait.  3. Anemia of CKD: Without overt blood loss; continue aranesp 60mg every wednesday  4. CKD-MBD: Continue Sensipar for PTH control.  Calcium and phosphorus levels currently at acceptable levels with ongoing calcium acetate for phosphorus binding.  5. Nutrition: changed to renal diet  6. Chronic hypotension: Blood pressures currently at goal on TID midodrine; monitor with hemodialysis/ultrafiltration.  EDW 135.5 kg prior to admission and currently about 10 kg over-continue efforts at UF with HD.  Disposition - At this time, her deconditioning limits her ability to undertake dialysis in recliner and as such, cannot go back to her outpatient dialysis unit until she has significant improvement of muscular strength/gait.  Subjective:   Seen and examined on dialysis.  Blood pressure 93/52 and HR 75.  LUE AVF in use. Tolerating goal.  We discussed her deconditioning; she states limited by extra fluid on her legs.  States uses oxygen when asleep  Review of systems:  She denies shortness of breath   Denies chest pain  Denies n/v    Objective:   BP (!) 106/55 (BP Location: Right Arm)   Pulse 71   Temp 98.5 F (36.9 C) (Oral)   Resp 20   Ht _0  (1.651 m)   Wt (!) 146.6 kg   SpO2  99%   BMI 53.78 kg/m   Physical Exam:  General adult female in bed in no acute distress HEENT normocephalic atraumatic extraocular movements intact sclera anicteric Neck supple trachea midline Lungs clear to auscultation bilaterally normal work of breathing at rest; on nasal cannula Heart S1S2 no rub Abdomen soft nontender nondistended; obese habitus Extremities chronic lymphedema  Psych normal mood and affect Neuro - alert and oriented x 3 provides hx and follows commands Access: LUE AVF in use  Labs: BMET Recent Labs  Lab 07/21/21 0120 07/22/21 0104 07/24/21 0838 07/27/21 0703  NA 134* 132* 133* 133*  K 4.6 6.1* 4.3 4.3  CL 96* 93* 94* 93*  CO2 _1 GLUCOSE 122* 129* 132* 117*  BUN 27* 41* 46* 54*  CREATININE 3.98* 4.97* 5.47* 6.64*  CALCIUM 9.5 9.4 9.4 9.6  PHOS 3.7 3.6 4.0 3.6   CBC Recent Labs  Lab 07/21/21 0120 07/22/21 0104 07/24/21 0838 07/27/21 0703  WBC 7.2 9.3 8.5 9.6  HGB 11.1* 11.1* 10.7* 10.4*  HCT 36.7 36.8 34.9* 32.4*  MCV 100.5* 101.4* 99.1 97.3  PLT 237 191 171 157      Medications:     amiodarone  200 mg Oral Daily   apixaban  5 mg Oral BID   calcium acetate  667 mg Oral TID WC   Chlorhexidine Gluconate Cloth  6 each Topical Daily   Chlorhexidine Gluconate Cloth  6 each Topical Q0600   cinacalcet  60 mg Oral Q M,W,F-HD   darbepoetin (ARANESP) injection - DIALYSIS  60 mcg Intravenous Q Wed-HD   DULoxetine  20 mg Oral Daily   feeding supplement (NEPRO CARB STEADY)  237 mL Oral TID BM   insulin aspart  0-15 Units Subcutaneous TID WC   insulin aspart  0-5 Units Subcutaneous QHS   mouth rinse  15 mL Mouth Rinse BID   midodrine  15 mg Oral TID WC   multivitamin  1 tablet Oral QHS   polyethylene glycol  17 g Oral Daily   senna  1 tablet Oral Daily   Claudia Desanctis, MD 07/27/2021, 10:34 AM

## 2021-07-27 NOTE — Progress Notes (Signed)
PROGRESS NOTE  [name]  QMV:[id number] DOB: October 19, 1966 DOA: 06/21/2021 PCP: Noreene Larsson, NP   Brief Narrative: 55 year old female with a history of end-stage renal disease on hemodialysis, has had a prolonged hospitalization since she was admitted on 06/21/2021 for cellulitis and concerns for sepsis.  She was treated with antibiotics, but had continued issues with hypotension.  Upon further evaluation, it was felt that her hypotension was more related to cardiogenic shock from RV failure and pulmonary hypertension group 3.  She is being followed by cardiology, nephrology and PCCM.  She is intermittently required vasopressors for hypotension.  She has been off vasopressors since 7/20 and has tolerated hemodialysis.  Currently on midodrine.  Transferred to [facility] service on 7/23.  Overnight on 7/26 patient became tachycardic and hypotensive concerns for left lower extremity cellulitis/sepsis started on IV daptomycin and cefepime.  CT of the thigh was ordered, patient was seen by wound care. Infectious disease team consulted, remove midline and start linezolid twice daily. Hemodialysis continues to assist with volume removal.   Assessment & Plan: Principal Problem:   Hypotension Active Problems:   Lymphedema of lower extremity   Morbid obesity (HCC)   Obesity hypoventilation syndrome (HCC)   Cellulitis   ESRD on dialysis ([place])   Staphylococcus epidermidis bacteremia   Elevated troponin   Chronic right heart failure (HCC)   Type 2 diabetes mellitus with hyperglycemia (HCC)   Lymphedema   Cardiogenic shock (HCC)   Bilateral calf pain   Pressure injury of skin   Pulmonary hypertension (HCC)  Cardiogenic shock due to RV failure Pulmonary hypertension Acute on chronic diastolic congestive heart failure - Off vasopressors since 7/20, continuing midodrine at high dose. If hypotensive, recommend small volume boluses. - Continue HD MWF - defer to nephrology - Pt will need to be able to  transfer to HD chair and sit for full treatment prior to discharge. Making progress with PT/OT. - Needs advanced heart failure clinic follow up (signed off during this hospitalization) post discharge  Left lower extremity 5 cm X 11 cm full-thickness wound in medial and posterior upper thigh: Cellulitis thought to have been ruled out per ID. CT only showed chronic inflammation and lymphedema - Continue BID dressing changes.  - Compression stockings, unna boots for lymphedema - No evidence of infection on exam.  - Continue dilaudid in place of oxycodone given ESRD - continue to wean as tolerated  Infected RUE midline catheter: Has been discontinued.  - Completed linezolid x5 days after midline removal per ID recommendation.   Atypical atrial flutter: NSR s/p DCCV 7/15.  - Continue amiodarone, eliquis.    ESRD: AVF largely occluded, so ligation not required at this time.  - HD as above per nephrology. Needs to sit up in chair to discharge for outpatient HD. Encouraged frequent PT/OT/mobility per RN.   Hyperkalemia:  - Managed by dialysis   OSA: Suspected - Continue CPAP qHS - Outpatient sleep study recommended.    Fibromyalgia - Continue duloxetine.   Anemia of chronic kidney disease: Stable without bleeding.  - ESA per nephrology.  - With current protracted hospitalization and suspected prolongation from this point, I'm hoping we can decrease frequency of blood draws.  T2DM with hyperglycemia: HbA1c 6.9%.  - Continue SSI, very modest requirements but does get some insulin regularly, so will continue checks and coverage to optimize wound healing.  - Continue carb-limited diet (in addition to heart healthy/renal)   Weakness:  - Continue PT/OT efforts. PT recommends SNF placement.  Mobility improving with volume removal and continued therapy.   Goals of care discussion: Due to multiple medical comorbidities and acute ongoing issues.  Palliative care team was consulted.  Patient  wishes to be full code with full scope of treatment, hopeful for improvement and going to rehab.   RN Pressure Injury Documentation: Pressure Injury 07/04/21 Thigh Posterior;Proximal;Right Stage 2 -  Partial thickness loss of dermis presenting as a shallow open injury with a red, pink wound bed without slough. (Active)  07/04/21 1200  Location: Thigh  Location Orientation: Posterior;Proximal;Right  Staging: Stage 2 -  Partial thickness loss of dermis presenting as a shallow open injury with a red, pink wound bed without slough.  Wound Description (Comments):   Present on Admission: No   Obesity: Estimated body mass index is 54.37 kg/m as calculated from the following:   Height as of this encounter: _0  (1.651 m).   Weight as of this encounter: 148.2 kg.  DVT prophylaxis: Eliquis Code Status: Full Family Communication: None at bedside Disposition Plan:  Status is: Inpatient  Remains inpatient appropriate because:Inpatient level of care appropriate due to severity of illness. Needs improvement in volume status with HD which will facilitate mobility which will allow her to transfer and remain in chair for duration of hemodialysis sessions as an outpatient.  Dispo: The patient is from: Home              Anticipated d/c is to: SNF              Patient currently is medically stable to d/c. Awaiting safe dispo location   Difficult to place patient No  Consultants:  Nephrology ID  Subjective: No acute issues/events overnight - focused on working with PT today  Objective: Vitals:   07/27/21 0311 07/27/21 0400 07/27/21 0500 07/27/21 0600  BP: (!) 108/59     Pulse: 70     Resp: _1 Temp: 98.6 F (37 C)     TempSrc: Oral     SpO2: 96%     Weight: (!) 148.2 kg     Height:        Intake/Output Summary (Last 24 hours) at 07/27/2021 0742 Last data filed at 07/26/2021 1222 Gross per 24 hour  Intake 720 ml  Output --  Net 720 ml      Filed Weights   07/25/21 0352  07/26/21 0449 07/27/21 0311  Weight: (!) 143.8 kg (!) 144.9 kg (!) 148.2 kg   Gen: 55 y.o. female in no distress Pulm: Nonlabored breathing, clear.. CV: Regular rate and rhythm. No murmur, rub, or gallop. UTD, no definite JVD, stable woody dependent edema. GI: Abdomen soft, non-tender, non-distended, with normoactive bowel sounds.  Ext: Warm, no deformities. LUA accessed. Skin: No new rashes, lesions or ulcers on visualized skin. Neuro: Alert and oriented. No focal neurological deficits. Psych: Judgement and insight appear fair. Mood euthymic & affect congruent. Behavior is appropriate.    Data Reviewed: I have personally reviewed following labs and imaging studies  CBC: Recent Labs  Lab 07/21/21 0120 07/22/21 0104 07/24/21 0838  WBC 7.2 9.3 8.5  HGB 11.1* 11.1* 10.7*  HCT 36.7 36.8 34.9*  MCV 100.5* 101.4* 99.1  PLT 237 191 615    Basic Metabolic Panel: Recent Labs  Lab 07/21/21 0120 07/22/21 0104 07/24/21 0838  NA 134* 132* 133*  K 4.6 6.1* 4.3  CL 96* 93* 94*  CO2 _2 GLUCOSE 122* 129* 132*  BUN 27* 41*  46*  CREATININE 3.98* 4.97* 5.47*  CALCIUM 9.5 9.4 9.4  PHOS 3.7 3.6 4.0    GFR: Estimated Creatinine Clearance: 17.4 mL/min (A) (by C-G formula based on SCr of 5.47 mg/dL (H)). Liver Function Tests: Recent Labs  Lab 07/21/21 0120 07/22/21 0104 07/24/21 0838  ALBUMIN 3.2* 3.0* 3.2*    No results for input(s): LIPASE, AMYLASE in the last 168 hours. No results for input(s): AMMONIA in the last 168 hours. Coagulation Profile: No results for input(s): INR, PROTIME in the last 168 hours.  Cardiac Enzymes: No results for input(s): CKTOTAL, CKMB, CKMBINDEX, TROPONINI in the last 168 hours.  BNP (last 3 results) No results for input(s): PROBNP in the last 8760 hours. HbA1C: No results for input(s): HGBA1C in the last 72 hours. CBG: Recent Labs  Lab 07/26/21 0612 07/26/21 1219 07/26/21 1554 07/26/21 2029 07/27/21 0644  GLUCAP 120* 141* 134*  125* 113*      No results found for this or any previous visit (from the past 240 hour(s)).     Radiology Studies: No results found.  Scheduled Meds:  amiodarone  200 mg Oral Daily   apixaban  5 mg Oral BID   calcium acetate  667 mg Oral TID WC   Chlorhexidine Gluconate Cloth  6 each Topical Daily   Chlorhexidine Gluconate Cloth  6 each Topical Q0600   cinacalcet  60 mg Oral Q M,W,F-HD   darbepoetin (ARANESP) injection - DIALYSIS  60 mcg Intravenous Q Wed-HD   DULoxetine  20 mg Oral Daily   feeding supplement (NEPRO CARB STEADY)  237 mL Oral TID BM   insulin aspart  0-15 Units Subcutaneous TID WC   insulin aspart  0-5 Units Subcutaneous QHS   mouth rinse  15 mL Mouth Rinse BID   midodrine  15 mg Oral TID WC   multivitamin  1 tablet Oral QHS   polyethylene glycol  17 g Oral Daily   senna  1 tablet Oral Daily   Continuous Infusions:  sodium chloride     albumin human     promethazine (PHENERGAN) injection (IM or IVPB) Stopped (07/02/21 1831)     LOS: 36 days   Time spent: 25 minutes.  Little Ishikawa, MD [facility] [email or web address] 07/27/2021, 7:42 AM

## 2021-07-28 DIAGNOSIS — I9589 Other hypotension: Secondary | ICD-10-CM | POA: Diagnosis not present

## 2021-07-28 DIAGNOSIS — E861 Hypovolemia: Secondary | ICD-10-CM | POA: Diagnosis not present

## 2021-07-28 LAB — GLUCOSE, CAPILLARY
Glucose-Capillary: 103 mg/dL — ABNORMAL HIGH (ref 70–99)
Glucose-Capillary: 93 mg/dL (ref 70–99)
Glucose-Capillary: 124 mg/dL — ABNORMAL HIGH (ref 70–99)
Glucose-Capillary: 117 mg/dL — ABNORMAL HIGH (ref 70–99)

## 2021-07-28 LAB — SURGICAL PCR SCREEN
MRSA, PCR: NEGATIVE
Staphylococcus aureus: NEGATIVE

## 2021-07-28 LAB — HEPATITIS B SURFACE ANTIBODY, QUANTITATIVE: Hep B S AB Quant (Post): 3.8 m[IU]/mL — ABNORMAL LOW (ref >9.9)

## 2021-07-28 MED ORDER — CHLORHEXIDINE GLUCONATE CLOTH 2 % EX PADS
6.0000 | MEDICATED_PAD | Freq: Every day | CUTANEOUS | Status: DC
  Administered 2021-07-30: 6 via TOPICAL

## 2021-07-28 NOTE — Progress Notes (Signed)
Physical Therapy Treatment Patient Details Name: [name] MRN: [id number] DOB: December 23, 1965 Today's Date: 07/28/2021    History of Present Illness 55 y.o. female presenting from [place] ED 7/3 with lethargy, hypotension and elevated lactic acid after d/c from [facility] earlier that day with septic shock and cellulitis. Patient found to have AMS, septic shock, acute respiratory failure, acute on chronic RV failure. Transferred to ICU on 7/12 due to hypotension. PMHX significant for obesity, DMII, COPD, CAD, lymphedema, ESRD on HD MWF and CHF.    PT Comments    Pt received in supine, agreeable to therapy session and with good participation and fair tolerance for bed mobility and seated/supine LE exercises. Pt limited due to nausea this date and unable to initiate standing transfers or tolerate bed in chair position due to L thigh pain with LE in dependent position. Plan to assess standing with Clarise Cruz Plus sit<>stand machine next session.   Follow Up Recommendations  SNF     Equipment Recommendations  Other (comment) (TBA)    Recommendations for Other Services       Precautions / Restrictions Precautions Precautions: Fall Precaution Comments: bil LE edema, L LE/groin wounds Restrictions Weight Bearing Restrictions: No    Mobility  Bed Mobility Overal bed mobility: Needs Assistance Bed Mobility: Supine to Sit;Sit to Supine     Supine to sit: Modified independent (Device/Increase time);HOB elevated Sit to supine: Mod assist;+2 for physical assistance   General bed mobility comments: HOB elevated with use of rail and increased time pt able to pivot to left side of bed without physical assist. Return to bed with assist to lift legs to surface. Pt able to slide toward [facility] with rails with min/mod assist.    Transfers Overall transfer level: Needs assistance               General transfer comment: pt agreeable to attempt standing initially but then too nauseated in seated posture with  legs dependent at EOB, unable to attempt any standing trials 2/2 nausea/L thigh pain. Of note, Stedy frame may be too small width for standing trials so recommend EVA vs Clarise Cruz Plus next session to stand.  Ambulation/Gait                 Stairs             Wheelchair Mobility    Modified Rankin (Stroke Patients Only)       Balance Overall balance assessment: Needs assistance Sitting-balance support: No upper extremity supported;Feet unsupported Sitting balance-Leahy Scale: Fair Sitting balance - Comments: supervision EOB with static sitting and weight shifting                                    Cognition Arousal/Alertness: Awake/alert Behavior During Therapy: WFL for tasks assessed/performed Overall Cognitive Status: Within Functional Limits for tasks assessed                                 General Comments: very pleasant, motivated to participate; frustrated with continued nausea but agreeable to attempt mobility tasks.      Exercises General Exercises - Lower Extremity Ankle Circles/Pumps: AROM;Both;10 reps;Supine Quad Sets: AROM;Both;5 reps;Supine Gluteal Sets: AROM;5 reps;Supine Heel Slides: AROM;Both;5 reps;Supine Hip Flexion/Marching: AROM;Both;5 reps;Seated Other Exercises Other Exercises: R hip IR x5 reps (unable to tolerate on LLE due to pain)  General Comments General comments (skin integrity, edema, etc.): BP 110/67 seated EOB; BP 102/83 seated EOB ~5 mins later (nauseated)      Pertinent Vitals/Pain Pain Assessment: 0-10 Pain Score: 9  Pain Location: LEs Pain Descriptors / Indicators: Grimacing;Guarding;Sore;Burning;Aching Pain Intervention(s): Limited activity within patient's tolerance;Monitored during session;Repositioned;Patient requesting pain meds-RN notified;Heat applied (heat to L thigh once back to supine)    Home Living                      Prior Function            PT Goals (current  goals can now be found in the care plan section) Acute Rehab PT Goals Patient Stated Goal: get stronger and return home PT Goal Formulation: With patient Time For Goal Achievement: 08/04/21 Potential to Achieve Goals: Fair Progress towards PT goals: Progressing toward goals (slow progress, nausea/pain limiting)    Frequency    Min 2X/week      PT Plan Current plan remains appropriate    Co-evaluation              AM-PAC PT "6 Clicks" Mobility   Outcome Measure  Help needed turning from your back to your side while in a flat bed without using bedrails?: A Little Help needed moving from lying on your back to sitting on the side of a flat bed without using bedrails?: A Little Help needed moving to and from a bed to a chair (including a wheelchair)?: Total Help needed standing up from a chair using your arms (e.g., wheelchair or bedside chair)?: Total Help needed to walk in hospital room?: Total Help needed climbing 3-5 steps with a railing? : Total 6 Click Score: 10    End of Session Equipment Utilized During Treatment: Oxygen;Other (comment) (1.5L O2 in supine, doffed while seated and WNL on RA in seated posture, return to 1.5L [place] in supine) Activity Tolerance: Other (comment);Patient limited by pain (nausea/L thigh pain limiting) Patient left: in bed;with call bell/phone within reach (LLE elevated with heel floated) Nurse Communication: Mobility status PT Visit Diagnosis: Other abnormalities of gait and mobility (R26.89);Muscle weakness (generalized) (M62.81);Pain Pain - Right/Left: Left Pain - part of body: Leg     Time: 7619-5093 PT Time Calculation (min) (ACUTE ONLY): 27 min  Charges:  $Therapeutic Exercise: 8-22 mins $Therapeutic Activity: 8-22 mins                     Jarryd Gratz P., PTA Acute Rehabilitation Services Pager: [phone] Office: [place] 07/28/2021, 3:34 PM

## 2021-07-28 NOTE — Progress Notes (Signed)
Patient ID: [name], female   DOB: [birth year], 55 y.o.   MRN: [id number] [facility] Progress Note   Assessment/ Plan:   1.  Acute exacerbation of chronic right ventricular heart failure with pulmonary hypertension/shock: Sildenafil was discontinued and she remains on high-dose midodrine for blood pressure support.  Continue volume management with hemodialysis and ultrafiltration.  2. ESRD:  - Continue HD per a Monday/Wednesday/Friday schedule.  (keep systolic blood pressure >68).   - At this time, her deconditioning limits her ability to undertake dialysis in recliner and as such, cannot go back to her outpatient dialysis unit until she has significant improvement of muscular strength - Out of bed to chair as tolerated  - I am hesitant to order HD in recliner for 8/10 as she's only sat in a chair for an hour at a time per her report and she is hesitant to try getting to a chair today  3. Anemia of CKD: Without overt blood loss; continue aranesp 15mg every Wednesday   4. CKD-MBD: Continue Sensipar for PTH control.  Calcium and phosphorus levels currently at acceptable levels with calcium acetate for phosphorus binder    5. Nutrition: changed to renal diet  6. Chronic hypotension: Blood pressures currently at goal on TID midodrine; monitor with hemodialysis/ultrafiltration.  EDW 135.5 kg prior to admission and currently about 10 kg over-continue efforts at UF with HD.  Disposition - At this time, her deconditioning limits her ability to undertake dialysis in recliner and as such, cannot go back to her outpatient dialysis unit until she has significant improvement of muscular strength/gait.  Spoke with primary team on 8/8 to update  Subjective:   Last HD on 8/8 with 3 kg UF.  She has only been in a chair for about an hour at a time and doesn't want to try to sit in a chair today as she doesn't think she could tolerate it  Review of systems:  She denies shortness of  breath   Denies chest pain  Reports some n/v today    Objective:   BP (!) 96/59 (BP Location: Right Arm)   Pulse 68   Temp 98.3 F (36.8 C) (Oral)   Resp 20   Ht 5' 5" (1.651 m)   Wt (!) 144.2 kg   SpO2 98%   BMI 52.90 kg/m   Physical Exam:   General adult female in bed in no acute distress HEENT normocephalic atraumatic extraocular movements intact sclera anicteric Neck supple trachea midline Lungs clear to auscultation bilaterally normal work of breathing at rest; on nasal cannula 4 liters  Heart S1S2 no rub Abdomen soft nontender nondistended; obese habitus Extremities chronic lymphedema  Psych normal mood and affect Neuro - alert and oriented x 3 provides hx and follows commands Access: LUE AVF bruit and thrill   Labs: BMET Recent Labs  Lab 07/22/21 0104 07/24/21 0838 07/27/21 0703  NA 132* 133* 133*  K 6.1* 4.3 4.3  CL 93* 94* 93*  CO2 _0 GLUCOSE 129* 132* 117*  BUN 41* 46* 54*  CREATININE 4.97* 5.47* 6.64*  CALCIUM 9.4 9.4 9.6  PHOS 3.6 4.0 3.6   CBC Recent Labs  Lab 07/22/21 0104 07/24/21 0838 07/27/21 0703  WBC 9.3 8.5 9.6  HGB 11.1* 10.7* 10.4*  HCT 36.8 34.9* 32.4*  MCV 101.4* 99.1 97.3  PLT 191 171 157      Medications:     amiodarone  200 mg Oral Daily   apixaban  5 mg Oral BID   calcium acetate  667 mg Oral TID WC   Chlorhexidine Gluconate Cloth  6 each Topical Daily   Chlorhexidine Gluconate Cloth  6 each Topical Q0600   cinacalcet  60 mg Oral Q M,W,F-HD   darbepoetin (ARANESP) injection - DIALYSIS  60 mcg Intravenous Q Wed-HD   DULoxetine  20 mg Oral Daily   feeding supplement (NEPRO CARB STEADY)  237 mL Oral TID BM   insulin aspart  0-15 Units Subcutaneous TID WC   insulin aspart  0-5 Units Subcutaneous QHS   mouth rinse  15 mL Mouth Rinse BID   midodrine  15 mg Oral TID WC   multivitamin  1 tablet Oral QHS   polyethylene glycol  17 g Oral Daily   senna  1 tablet Oral Daily   Claudia Desanctis, MD 07/28/2021, 11:21  AM

## 2021-07-28 NOTE — Progress Notes (Signed)
Mobility Specialist: Progress Note   07/28/21 1506  Mobility  Activity Dangled on edge of bed  Level of Assistance Modified independent, requires aide device or extra time  Assistive Device None  Mobility Sit up in bed/chair position for meals  Mobility Response Tolerated fair  Mobility performed by Mobility specialist  $Mobility charge 1 Mobility   Pre-Mobility: 76 HR, 111/64 BP, 99% SpO2 Post-Mobility: 80 HR, 102/83 BP, 97% SpO2  Pt mod independent to EOB with use of bed rails. Pt sat EOB for a few minutes but was unable to stand d/t feeling nauseated and pain level. Pt assisted back into bed with call bell and phone at her side.   [facility] [place] Mobility Specialist Mobility Specialist Phone: [phone]

## 2021-07-28 NOTE — Progress Notes (Signed)
PROGRESS NOTE  [name]  EKC:[id number] DOB: 1966-07-02 DOA: 06/21/2021 PCP: Noreene Larsson, NP   Brief Narrative: 55 year old female with a history of end-stage renal disease on hemodialysis, has had a prolonged hospitalization since she was admitted on 06/21/2021 for cellulitis and concerns for sepsis.  She was treated with antibiotics, but had continued issues with hypotension.  Upon further evaluation, it was felt that her hypotension was more related to cardiogenic shock from RV failure and pulmonary hypertension group 3.  She is being followed by cardiology, nephrology and PCCM.  She is intermittently required vasopressors for hypotension.  She has been off vasopressors since 7/20 and has tolerated hemodialysis.  Currently on midodrine.  Transferred to [facility] service on 7/23.  Overnight on 7/26 patient became tachycardic and hypotensive concerns for left lower extremity cellulitis/sepsis started on IV daptomycin and cefepime.  CT of the thigh was ordered, patient was seen by wound care. Infectious disease team consulted, remove midline and start linezolid twice daily. Hemodialysis continues to assist with volume removal.   Assessment & Plan: Principal Problem:   Hypotension Active Problems:   Lymphedema of lower extremity   Morbid obesity (HCC)   Obesity hypoventilation syndrome (HCC)   Cellulitis   ESRD on dialysis ([place])   Staphylococcus epidermidis bacteremia   Elevated troponin   Chronic right heart failure (HCC)   Type 2 diabetes mellitus with hyperglycemia (HCC)   Lymphedema   Cardiogenic shock (HCC)   Bilateral calf pain   Pressure injury of skin   Pulmonary hypertension (HCC)   Cardiogenic shock due to RV failure Pulmonary hypertension Acute on chronic diastolic congestive heart failure - Off vasopressors since 7/20, continuing midodrine at high dose. If hypotensive, recommend small volume boluses. - Continue HD MWF - defer to nephrology - Pt will need to be able to  transfer to HD chair and sit for full treatment prior to discharge. Making progress with PT/OT. - Needs advanced heart failure clinic follow up (signed off during this hospitalization) post discharge  Left lower extremity 5 cm X 11 cm full-thickness wound in medial and posterior upper thigh: Cellulitis thought to have been ruled out per ID. CT only showed chronic inflammation and lymphedema - Continue BID dressing changes.  - Compression stockings, unna boots for lymphedema - No evidence of infection on exam.  - Continue dilaudid in place of oxycodone given ESRD - continue to wean as tolerated  Infected RUE midline catheter: Has been discontinued.  - Completed linezolid x5 days after midline removal per ID recommendation, completed.   Atypical atrial flutter: NSR s/p DCCV 7/15.  - Continue amiodarone, eliquis.    ESRD: AVF largely occluded, so ligation not required at this time.  - HD as above per nephrology. Needs to be able to sit up in chair to discharge for outpatient HD. Encouraged frequent PT/OT/mobility per RN.   Hyperkalemia:  - Managed by dialysis   OSA: Suspected - Continue CPAP qHS - Outpatient sleep study recommended.    Fibromyalgia - Continue duloxetine.   Anemia of chronic kidney disease: Stable without bleeding.  - ESA per nephrology.  - With current protracted hospitalization and suspected prolongation from this point, I'm hoping we can decrease frequency of blood draws.  T2DM with hyperglycemia: HbA1c 6.9%.  - Continue SSI, very modest requirements but does get some insulin regularly, so will continue checks and coverage to optimize wound healing.  - Continue carb-limited diet (in addition to heart healthy/renal)   Weakness:  - Continue PT/OT  efforts. PT recommends SNF placement. Mobility improving with volume removal and continued therapy. Dispo limited by ambulation and inability to have HD in seated position as above.   Goals of care discussion: Due to  multiple medical comorbidities and acute ongoing issues.  Palliative care team was consulted.  Patient wishes to be full code with full scope of treatment, hopeful for improvement and going to rehab.   RN Pressure Injury Documentation: Pressure Injury 07/04/21 Thigh Posterior;Proximal;Right Stage 2 -  Partial thickness loss of dermis presenting as a shallow open injury with a red, pink wound bed without slough. (Active)  07/04/21 1200  Location: Thigh  Location Orientation: Posterior;Proximal;Right  Staging: Stage 2 -  Partial thickness loss of dermis presenting as a shallow open injury with a red, pink wound bed without slough.  Wound Description (Comments):   Present on Admission: No   Obesity: Estimated body mass index is 52.9 kg/m as calculated from the following:   Height as of this encounter: _0  (1.651 m).   Weight as of this encounter: 144.2 kg.  DVT prophylaxis: Eliquis Code Status: Full Family Communication: None at bedside Disposition Plan:  Status is: Inpatient  Remains inpatient appropriate because:Inpatient level of care appropriate due to severity of illness. Needs improvement in volume status with HD which will facilitate mobility which will allow her to transfer and remain in chair for duration of hemodialysis sessions as an outpatient.  Dispo: The patient is from: Home              Anticipated d/c is to: SNF              Patient currently is medically stable to d/c. Awaiting safe dispo location and ability to tolerate HD seated   Difficult to place patient No  Consultants:  Nephrology ID  Subjective: No acute issues/events overnight - focused on working with PT today  Objective: Vitals:   07/27/21 2207 07/27/21 2313 07/27/21 2327 07/28/21 0322  BP: 108/61 (!) 105/58  97/60  Pulse: 83 80 80 75  Resp: _1 Temp: 98.8 F (37.1 C) 98.8 F (37.1 C)  98.8 F (37.1 C)  TempSrc: Oral Oral  Oral  SpO2: 95% 95% 97% 97%  Weight:    (!) 144.2 kg   Height:        Intake/Output Summary (Last 24 hours) at 07/28/2021 0726 Last data filed at 07/27/2021 2155 Gross per 24 hour  Intake 870 ml  Output 3000 ml  Net -2130 ml      Filed Weights   07/27/21 0800 07/27/21 1230 07/28/21 0322  Weight: (!) 146.6 kg (!) 143.6 kg (!) 144.2 kg   Gen: 55 y.o. female in no distress Pulm: Nonlabored breathing, clear.. CV: Regular rate and rhythm. No murmur, rub, or gallop. UTD, no definite JVD, stable woody dependent edema. GI: Abdomen soft, non-tender, non-distended, with normoactive bowel sounds.  Ext: Warm, no deformities. LUA accessed. Skin: No new rashes, lesions or ulcers on visualized skin. Neuro: Alert and oriented. No focal neurological deficits. Psych: Judgement and insight appear fair. Mood euthymic & affect congruent. Behavior is appropriate.    Data Reviewed: I have personally reviewed following labs and imaging studies  CBC: Recent Labs  Lab 07/22/21 0104 07/24/21 0838 07/27/21 0703  WBC 9.3 8.5 9.6  HGB 11.1* 10.7* 10.4*  HCT 36.8 34.9* 32.4*  MCV 101.4* 99.1 97.3  PLT 191 171 621    Basic Metabolic Panel: Recent Labs  Lab 07/22/21 0104 07/24/21  6153 07/27/21 0703  NA 132* 133* 133*  K 6.1* 4.3 4.3  CL 93* 94* 93*  CO2 _0 GLUCOSE 129* 132* 117*  BUN 41* 46* 54*  CREATININE 4.97* 5.47* 6.64*  CALCIUM 9.4 9.4 9.6  PHOS 3.6 4.0 3.6    GFR: Estimated Creatinine Clearance: 14.1 mL/min (A) (by C-G formula based on SCr of 6.64 mg/dL (H)). Liver Function Tests: Recent Labs  Lab 07/22/21 0104 07/24/21 0838 07/27/21 0703  ALBUMIN 3.0* 3.2* 3.5    No results for input(s): LIPASE, AMYLASE in the last 168 hours. No results for input(s): AMMONIA in the last 168 hours. Coagulation Profile: No results for input(s): INR, PROTIME in the last 168 hours.  Cardiac Enzymes: No results for input(s): CKTOTAL, CKMB, CKMBINDEX, TROPONINI in the last 168 hours.  BNP (last 3 results) No results for input(s):  PROBNP in the last 8760 hours. HbA1C: No results for input(s): HGBA1C in the last 72 hours. CBG: Recent Labs  Lab 07/27/21 0644 07/27/21 1309 07/27/21 1647 07/27/21 2006 07/28/21 0608  GLUCAP 113* 95 147* 114* 103*      No results found for this or any previous visit (from the past 240 hour(s)).     Radiology Studies: No results found.  Scheduled Meds:  amiodarone  200 mg Oral Daily   apixaban  5 mg Oral BID   calcium acetate  667 mg Oral TID WC   Chlorhexidine Gluconate Cloth  6 each Topical Daily   Chlorhexidine Gluconate Cloth  6 each Topical Q0600   cinacalcet  60 mg Oral Q M,W,F-HD   darbepoetin (ARANESP) injection - DIALYSIS  60 mcg Intravenous Q Wed-HD   DULoxetine  20 mg Oral Daily   feeding supplement (NEPRO CARB STEADY)  237 mL Oral TID BM   insulin aspart  0-15 Units Subcutaneous TID WC   insulin aspart  0-5 Units Subcutaneous QHS   mouth rinse  15 mL Mouth Rinse BID   midodrine  15 mg Oral TID WC   multivitamin  1 tablet Oral QHS   polyethylene glycol  17 g Oral Daily   senna  1 tablet Oral Daily   Continuous Infusions:  sodium chloride     promethazine (PHENERGAN) injection (IM or IVPB) Stopped (07/02/21 1831)     LOS: 37 days   Time spent: 25 minutes.  Little Ishikawa, MD [facility] [email or web address] 07/28/2021, 7:26 AM

## 2021-07-29 DIAGNOSIS — I9589 Other hypotension: Secondary | ICD-10-CM | POA: Diagnosis not present

## 2021-07-29 DIAGNOSIS — E861 Hypovolemia: Secondary | ICD-10-CM | POA: Diagnosis not present

## 2021-07-29 LAB — GLUCOSE, CAPILLARY
Glucose-Capillary: 98 mg/dL (ref 70–99)
Glucose-Capillary: 101 mg/dL — ABNORMAL HIGH (ref 70–99)
Glucose-Capillary: 108 mg/dL — ABNORMAL HIGH (ref 70–99)
Glucose-Capillary: 115 mg/dL — ABNORMAL HIGH (ref 70–99)

## 2021-07-29 LAB — CBC
HCT: 31.2 % — ABNORMAL LOW (ref 36.0–46.0)
Hemoglobin: 9.9 g/dL — ABNORMAL LOW (ref 12.0–15.0)
MCH: 30.7 pg (ref 26.0–34.0)
MCHC: 31.7 g/dL (ref 30.0–36.0)
MCV: 96.6 fL (ref 80.0–100.0)
Platelets: 193 10*3/uL (ref 150–400)
RBC: 3.23 MIL/uL — ABNORMAL LOW (ref 3.87–5.11)
RDW: 18 % — ABNORMAL HIGH (ref 11.5–15.5)
WBC: 8.8 10*3/uL (ref 4.0–10.5)
nRBC: 0.3 % — ABNORMAL HIGH (ref 0.0–0.2)

## 2021-07-29 LAB — RENAL FUNCTION PANEL
Albumin: 3.9 g/dL (ref 3.5–5.0)
Anion gap: 12 (ref 5–15)
BUN: 37 mg/dL — ABNORMAL HIGH (ref 6–20)
CO2: 25 mmol/L (ref 22–32)
Calcium: 8.4 mg/dL — ABNORMAL LOW (ref 8.9–10.3)
Chloride: 96 mmol/L — ABNORMAL LOW (ref 98–111)
Creatinine, Ser: 5.22 mg/dL — ABNORMAL HIGH (ref 0.44–1.00)
GFR, Estimated: 9 mL/min — ABNORMAL LOW (ref >60)
Glucose, Bld: 89 mg/dL (ref 70–99)
Phosphorus: 2.9 mg/dL (ref 2.5–4.6)
Potassium: 4 mmol/L (ref 3.5–5.1)
Sodium: 133 mmol/L — ABNORMAL LOW (ref 135–145)

## 2021-07-29 MED ORDER — DARBEPOETIN ALFA 60 MCG/0.3ML IJ SOSY
PREFILLED_SYRINGE | INTRAMUSCULAR | Status: AC
  Administered 2021-07-29: 60 ug via INTRAVENOUS
  Filled 2021-07-29: qty 0.3

## 2021-07-29 MED ORDER — MIDODRINE HCL 5 MG PO TABS
ORAL_TABLET | ORAL | Status: AC
  Filled 2021-07-29: qty 3

## 2021-07-29 MED ORDER — ALBUMIN HUMAN 25 % IV SOLN
INTRAVENOUS | Status: AC
  Administered 2021-07-29: 25 g via INTRAVENOUS
  Filled 2021-07-29: qty 100

## 2021-07-29 MED ORDER — ALBUMIN HUMAN 25 % IV SOLN
25.0000 g | Freq: Once | INTRAVENOUS | Status: AC
  Filled 2021-07-29: qty 100

## 2021-07-29 MED ORDER — ALBUMIN HUMAN 25 % IV SOLN
25.0000 g | Freq: Once | INTRAVENOUS | Status: AC
  Administered 2021-07-30: 25 g via INTRAVENOUS
  Filled 2021-07-29: qty 100

## 2021-07-29 NOTE — Progress Notes (Signed)
Nutrition Follow Up  DOCUMENTATION CODES:   Not applicable  INTERVENTION:   Continue Nepro Shake po TID, each supplement provides 425 kcal and 19 grams protein Continue renal MVI daily   NUTRITION DIAGNOSIS:   Increased nutrient needs related to wound healing as evidenced by estimated needs.  Ongoing  GOAL:   Patient will meet greater than or equal to 90% of their needs  Improving   MONITOR:   PO intake, Supplement acceptance, Weight trends, I & O's, Labs  REASON FOR ASSESSMENT:   Rounds    ASSESSMENT:   Patient with PMH significant for thyroid cancer, CHF, Chiari malformation, ESRD/HD, COPD, CAD, HLD, HTN, DM, and lymphedema. Presents this admission with cardiogenic shock from RV failure.  7/15- s/p DC cardioversion   Appetite has significantly improved over the last few days. Last four meal completions charted as 50%, 70%, 60%, and 100%. Taking Nepro shakes 2-3 times daily.   Attempted to tolerate HD in recliner prior to discharge.   EDW: 135 kg  Current weight: 142.2 kg   Medications: phoslo, aranesp, sensipar, SS novolog, miralax, senokot Labs: Na 133 (L) CBG 93-124  Diet Order:   Diet Order             Diet renal/carb modified with fluid restriction Diet-HS Snack? Nothing; Fluid restriction: 1200 mL Fluid; Room service appropriate? Yes; Fluid consistency: Thin  Diet effective now                   EDUCATION NEEDS:   Education needs have been addressed  Skin:  Skin Assessment: Skin Integrity Issues: Skin Integrity Issues:: Stage II Stage II: R thigh  Last BM:  8/7  Height:   Ht Readings from Last 1 Encounters:  07/16/21 _0  (1.651 m)    Weight:   Wt Readings from Last 1 Encounters:  07/29/21 (!) 142.2 kg    BMI:  Body mass index is 52.17 kg/m.  Estimated Nutritional Needs:   Kcal:  1800-2000 kcal  Protein:  90-105 grams  Fluid:  >/= 1.8 L/day  Mariana Single MS, RD, LDN, CNSC Clinical Nutrition Pager listed in  [place]

## 2021-07-29 NOTE — Progress Notes (Signed)
PROGRESS NOTE  [name]  YQM:[id number] DOB: 1966-12-06 DOA: 06/21/2021 PCP: Noreene Larsson, NP   Brief Narrative: 54 year old female with a history of end-stage renal disease on hemodialysis, has had a prolonged hospitalization since she was admitted on 06/21/2021 for cellulitis and concerns for sepsis.  She was treated with antibiotics, but had continued issues with hypotension.  Upon further evaluation, it was felt that her hypotension was more related to cardiogenic shock from RV failure and pulmonary hypertension group 3.  She is being followed by cardiology, nephrology and PCCM.  She is intermittently required vasopressors for hypotension.  She has been off vasopressors since 7/20 and has tolerated hemodialysis.  Currently on midodrine.  Transferred to [facility] service on 7/23.  Overnight on 7/26 patient became tachycardic and hypotensive concerns for left lower extremity cellulitis/sepsis started on IV daptomycin and cefepime.  CT of the thigh was ordered, patient was seen by wound care. Infectious disease team consulted, remove midline and start linezolid twice daily. Hemodialysis continues to assist with volume removal.   Assessment & Plan: Principal Problem:   Hypotension Active Problems:   Lymphedema of lower extremity   Morbid obesity (HCC)   Obesity hypoventilation syndrome (HCC)   Cellulitis   ESRD on dialysis ([place])   Staphylococcus epidermidis bacteremia   Elevated troponin   Chronic right heart failure (HCC)   Type 2 diabetes mellitus with hyperglycemia (HCC)   Lymphedema   Cardiogenic shock (HCC)   Bilateral calf pain   Pressure injury of skin   Pulmonary hypertension (HCC)  Cardiogenic shock due to RV failure, resolving Pulmonary hypertension, stable Acute on chronic diastolic congestive heart failure, resolving - Off vasopressors since 7/20, continuing midodrine at high dose. If hypotensive, recommend small volume boluses. - Continue HD MWF - defer to nephrology - Pt  will need to be able to transfer to HD chair and sit for full treatment prior to discharge. Making progress with PT/OT slowly, unfortunately this does limit her disposition options as she cannot discharge to SNF or really any outpatient facility until she is able to sit in chair and tolerate hemodialysis. - Needs advanced heart failure clinic follow up (signed off during this hospitalization) post discharge  Left lower extremity 5 cm X 11 cm full-thickness wound in medial and posterior upper thigh: Cellulitis thought to have been ruled out per ID. CT only showed chronic inflammation and lymphedema - Continue BID dressing changes.  - Compression stockings, unna boots for lymphedema - No evidence of infection on exam.  - Continue dilaudid in place of oxycodone given ESRD - continue to wean as tolerated  Infected RUE midline catheter: Has been discontinued.  - Completed linezolid x5 days after midline removal per ID recommendation, completed.   Atypical atrial flutter: NSR s/p DCCV 7/15.  - Continue amiodarone, eliquis.    ESRD: AVF largely occluded, so ligation not required at this time.  - HD as above per nephrology. Needs to be able to sit up in chair to discharge for outpatient HD. Encouraged frequent PT/OT/mobility per RN.   Hyperkalemia:  - Managed by dialysis   OSA: Suspected - Continue CPAP qHS - Outpatient sleep study recommended.    Fibromyalgia - Continue duloxetine.   Anemia of chronic kidney disease: Stable without bleeding.  - ESA per nephrology.  - With current protracted hospitalization and suspected prolongation from this point, I'm hoping we can decrease frequency of blood draws.  T2DM with hyperglycemia: HbA1c 6.9%.  - Continue SSI, very modest requirements but  does get some insulin regularly, so will continue checks and coverage to optimize wound healing.  - Continue carb-limited diet (in addition to heart healthy/renal)   Weakness:  - Continue PT/OT efforts. PT  recommends SNF placement. Mobility improving with volume removal and continued therapy. Dispo limited by ambulation and inability to have HD in seated position as above.   Goals of care discussion: Due to multiple medical comorbidities and acute ongoing issues.  Palliative care team was consulted.  Patient wishes to be full code with full scope of treatment, hopeful for improvement and going to rehab.   RN Pressure Injury Documentation: Pressure Injury 07/04/21 Thigh Posterior;Proximal;Right Stage 2 -  Partial thickness loss of dermis presenting as a shallow open injury with a red, pink wound bed without slough. (Active)  07/04/21 1200  Location: Thigh  Location Orientation: Posterior;Proximal;Right  Staging: Stage 2 -  Partial thickness loss of dermis presenting as a shallow open injury with a red, pink wound bed without slough.  Wound Description (Comments):   Present on Admission: No   Obesity: Estimated body mass index is 53.42 kg/m as calculated from the following:   Height as of this encounter: _0  (1.651 m).   Weight as of this encounter: 145.6 kg.  DVT prophylaxis: Eliquis Code Status: Full Family Communication: None at bedside Disposition Plan:  Status is: Inpatient  Remains inpatient appropriate because:Inpatient level of care appropriate due to severity of illness. Needs improvement in volume status with HD which will facilitate mobility which will allow her to transfer and remain in chair for duration of hemodialysis sessions as an outpatient.  Dispo: The patient is from: Home              Anticipated d/c is to: SNF              Patient currently is medically stable to d/c. Awaiting safe dispo location and ability to tolerate HD seated   Difficult to place patient No  Consultants:  Nephrology ID  Subjective: No acute issues/events overnight - focused on working with PT today -tolerating dialysis quite well but unfortunately she is sitting up in bed not truly in a  seated position as was hoped  Objective: Vitals:   07/28/21 1952 07/28/21 2322 07/28/21 2352 07/29/21 0346  BP: 97/61 (!) 95/53  119/72  Pulse: 71 73 74 69  Resp: _1 Temp: 98.4 F (36.9 C) 98.7 F (37.1 C)  98.2 F (36.8 C)  TempSrc: Oral Oral  Oral  SpO2: 99% 93% 94% 96%  Weight:    (!) 145.6 kg  Height:        Intake/Output Summary (Last 24 hours) at 07/29/2021 0737 Last data filed at 07/29/2021 0600 Gross per 24 hour  Intake 490 ml  Output --  Net 490 ml      Filed Weights   07/27/21 1230 07/28/21 0322 07/29/21 0346  Weight: (!) 143.6 kg (!) 144.2 kg (!) 145.6 kg   Gen: 55 y.o. female in no distress Pulm: Nonlabored breathing, clear.. CV: Regular rate and rhythm. No murmur, rub, or gallop. UTD, no definite JVD, stable woody dependent edema. GI: Abdomen soft, non-tender, non-distended, with normoactive bowel sounds.  Ext: Warm, no deformities. LUA accessed. Skin: No new rashes, lesions or ulcers on visualized skin. Neuro: Alert and oriented. No focal neurological deficits. Psych: Judgement and insight appear fair. Mood euthymic & affect congruent. Behavior is appropriate.    Data Reviewed: I have personally reviewed following labs and  imaging studies  CBC: Recent Labs  Lab 07/24/21 0838 07/27/21 0703 07/29/21 0040  WBC 8.5 9.6 8.8  HGB 10.7* 10.4* 9.9*  HCT 34.9* 32.4* 31.2*  MCV 99.1 97.3 96.6  PLT 171 157 497    Basic Metabolic Panel: Recent Labs  Lab 07/24/21 0838 07/27/21 0703  NA 133* 133*  K 4.3 4.3  CL 94* 93*  CO2 26 28  GLUCOSE 132* 117*  BUN 46* 54*  CREATININE 5.47* 6.64*  CALCIUM 9.4 9.6  PHOS 4.0 3.6    GFR: Estimated Creatinine Clearance: 14.1 mL/min (A) (by C-G formula based on SCr of 6.64 mg/dL (H)). Liver Function Tests: Recent Labs  Lab 07/24/21 0838 07/27/21 0703  ALBUMIN 3.2* 3.5    No results for input(s): LIPASE, AMYLASE in the last 168 hours. No results for input(s): AMMONIA in the last 168  hours. Coagulation Profile: No results for input(s): INR, PROTIME in the last 168 hours.  Cardiac Enzymes: No results for input(s): CKTOTAL, CKMB, CKMBINDEX, TROPONINI in the last 168 hours.  BNP (last 3 results) No results for input(s): PROBNP in the last 8760 hours. HbA1C: No results for input(s): HGBA1C in the last 72 hours. CBG: Recent Labs  Lab 07/28/21 0608 07/28/21 1052 07/28/21 1607 07/28/21 2111 07/29/21 0625  GLUCAP 103* 93 124* 117* 98      Recent Results (from the past 240 hour(s))  Surgical pcr screen     Status: None   Collection Time: 07/27/21 10:47 PM   Specimen: Nasal Mucosa; Nasal Swab  Result Value Ref Range Status   MRSA, PCR NEGATIVE NEGATIVE Final   Staphylococcus aureus NEGATIVE NEGATIVE Final    Comment: (NOTE) The Xpert SA Assay (FDA approved for NASAL specimens in patients 64 years of age and older), is one component of a comprehensive surveillance program. It is not intended to diagnose infection nor to guide or monitor treatment. Performed at [facility], [place] [address], [place], [place] [address]        Radiology Studies: No results found.  Scheduled Meds:  amiodarone  200 mg Oral Daily   apixaban  5 mg Oral BID   calcium acetate  667 mg Oral TID WC   Chlorhexidine Gluconate Cloth  6 each Topical Daily   Chlorhexidine Gluconate Cloth  6 each Topical Q0600   Chlorhexidine Gluconate Cloth  6 each Topical Q0600   cinacalcet  60 mg Oral Q M,W,F-HD   darbepoetin (ARANESP) injection - DIALYSIS  60 mcg Intravenous Q Wed-HD   DULoxetine  20 mg Oral Daily   feeding supplement (NEPRO CARB STEADY)  237 mL Oral TID BM   insulin aspart  0-15 Units Subcutaneous TID WC   insulin aspart  0-5 Units Subcutaneous QHS   mouth rinse  15 mL Mouth Rinse BID   midodrine  15 mg Oral TID WC   multivitamin  1 tablet Oral QHS   polyethylene glycol  17 g Oral Daily   senna  1 tablet Oral Daily   Continuous Infusions:  sodium chloride      promethazine (PHENERGAN) injection (IM or IVPB) Stopped (07/02/21 1831)     LOS: 38 days   Time spent: 25 minutes.  Little Ishikawa, MD [facility] [email or web address] 07/29/2021, 7:37 AM

## 2021-07-29 NOTE — Progress Notes (Signed)
Patient ID: ADYSEN RAPHAEL, female   DOB: 1966/02/11, 55 y.o.   MRN: [id number] [facility] Progress Note   Assessment/ Plan:   1.  Acute exacerbation of chronic right ventricular heart failure with pulmonary hypertension/shock: Sildenafil was discontinued and she remains on high-dose midodrine for blood pressure support.  Continue volume management with hemodialysis and ultrafiltration.  2. ESRD:  - Continue HD per a Monday/Wednesday/Friday schedule.  (keep systolic blood pressure >67).   - At this time, her deconditioning limits her ability to undertake dialysis in recliner and as such, cannot go back to her outpatient dialysis unit until she has significant improvement of muscular strength - Out of bed to chair as tolerated  - I will try HD in a recliner on 8/12 - albumin with HD today - follow renal panel - note had been on HD 15 minutes  3. Anemia of CKD: Without overt blood loss; continue aranesp 59mg every Wednesday   4. CKD-MBD: Continue Sensipar for PTH control.  Calcium and phosphorus levels currently at acceptable levels with calcium acetate for phosphorus binder    5. Nutrition: changed to renal diet  6. Chronic hypotension: Blood pressures currently at goal on TID midodrine; monitor with hemodialysis/ultrafiltration.  EDW 135.5 kg prior to admission and currently about 10 kg over-continue efforts at UF with HD.  Disposition - At this time, her deconditioning limits her ability to undertake dialysis in recliner and as such, cannot go back to her outpatient dialysis unit until she has significant improvement of muscular strength/gait.   Subjective:   Seen and examined on dialysis.  Blood pressure 122/66 and HR 65.  Pressure dressing applied by nursing to old needle sites - oozing stopped.  No heparin.  Tolerating goal.  Getting albumin. Labs not yet sent and has been on 15 minutes.   Review of systems:  She denies shortness of breath   Denies chest pain   Denies N/V    Objective:   BP 117/67 (BP Location: Right Arm)   Pulse 66   Temp 98.1 F (36.7 C) (Oral)   Resp 19   Ht _0  (1.651 m)   Wt (!) 145.6 kg   SpO2 96%   BMI 53.42 kg/m   Physical Exam:   General adult female in bed in no acute distress HEENT normocephalic atraumatic extraocular movements intact sclera anicteric Neck supple trachea midline Lungs clear to auscultation bilaterally normal work of breathing at rest; on nasal cannula 2 liters  Heart S1S2 no rub Abdomen soft nontender nondistended; obese habitus Extremities chronic lymphedema  Psych normal mood and affect Neuro - alert and oriented x 3 provides hx and follows commands Access: LUE AVF in use  Labs: BMET Recent Labs  Lab 07/24/21 0838 07/27/21 0703  NA 133* 133*  K 4.3 4.3  CL 94* 93*  CO2 26 28  GLUCOSE 132* 117*  BUN 46* 54*  CREATININE 5.47* 6.64*  CALCIUM 9.4 9.6  PHOS 4.0 3.6   CBC Recent Labs  Lab 07/24/21 0838 07/27/21 0703 07/29/21 0040  WBC 8.5 9.6 8.8  HGB 10.7* 10.4* 9.9*  HCT 34.9* 32.4* 31.2*  MCV 99.1 97.3 96.6  PLT 171 157 193      Medications:     amiodarone  200 mg Oral Daily   apixaban  5 mg Oral BID   calcium acetate  667 mg Oral TID WC   Chlorhexidine Gluconate Cloth  6 each Topical Daily   Chlorhexidine Gluconate Cloth  6 each  Topical Q0600   Chlorhexidine Gluconate Cloth  6 each Topical Q0600   cinacalcet  60 mg Oral Q M,W,F-HD   Darbepoetin Alfa       darbepoetin (ARANESP) injection - DIALYSIS  60 mcg Intravenous Q Wed-HD   DULoxetine  20 mg Oral Daily   feeding supplement (NEPRO CARB STEADY)  237 mL Oral TID BM   insulin aspart  0-15 Units Subcutaneous TID WC   insulin aspart  0-5 Units Subcutaneous QHS   mouth rinse  15 mL Mouth Rinse BID   midodrine  15 mg Oral TID WC   multivitamin  1 tablet Oral QHS   polyethylene glycol  17 g Oral Daily   senna  1 tablet Oral Daily   Claudia Desanctis, MD 07/29/2021, 9:01 AM

## 2021-07-29 NOTE — Progress Notes (Signed)
Paged on call MD C Hall, pt's BP 80/53 with Map of 63. Notified earlier her pressure was 80/44 and came back to 90/64 after laying down for 15-20 min. Notified MD pt cannot  lay down for too long.  had 15 mg Midodrine at 1758 and was on dialysis today. MD stated she will order Albumin, and update with BP after administration. Notified MD pt is non symptomatic. Will continue to monitor.

## 2021-07-29 NOTE — Progress Notes (Addendum)
OT Cancellation Note  Patient Details Name: [name] MRN: [id number] DOB: [birth year]   Cancelled Treatment:    Reason Eval/Treat Not Completed: Patient at procedure or test/ unavailable Planned to see pt at [facility] for OT session though noted to be leaving unit for HD at 830AM. Will follow-up in PM as schedule permits.  Edit: re-attempted OT session around 130PM when pt returned from dialysis. Pt reports too much pain to participate and feeling worn out after HD. Will follow-up tomorrow for OT session.  [name] 07/29/2021, 8:36 AM

## 2021-07-29 NOTE — Progress Notes (Signed)
This chaplain is present for F/U spiritual care. The Pt. shares with the chaplain, she just finished HD and as always she is tired and ready to eat.   During the quick visit, the chaplain understands the Pt. welcomes the visits and prayer. The chaplain reflects on the Pt. upcoming birthday. The Pt. replies she hopes to be home by her birthday.   The chaplain accepts the Pt. request for prayer with each visit; a tradition the Pt. carries with her from her father.

## 2021-07-29 NOTE — Progress Notes (Signed)
This chaplain attempted spiritual care F/U.  The chaplain understands the Pt. is in HD.  The chaplain will F/U as schedule permits.

## 2021-07-30 DIAGNOSIS — R5381 Other malaise: Secondary | ICD-10-CM | POA: Diagnosis not present

## 2021-07-30 DIAGNOSIS — N186 End stage renal disease: Secondary | ICD-10-CM | POA: Diagnosis not present

## 2021-07-30 DIAGNOSIS — Z515 Encounter for palliative care: Secondary | ICD-10-CM | POA: Diagnosis not present

## 2021-07-30 DIAGNOSIS — I9589 Other hypotension: Secondary | ICD-10-CM | POA: Diagnosis not present

## 2021-07-30 DIAGNOSIS — E861 Hypovolemia: Secondary | ICD-10-CM | POA: Diagnosis not present

## 2021-07-30 DIAGNOSIS — Z7189 Other specified counseling: Secondary | ICD-10-CM | POA: Diagnosis not present

## 2021-07-30 LAB — GLUCOSE, CAPILLARY
Glucose-Capillary: 125 mg/dL — ABNORMAL HIGH (ref 70–99)
Glucose-Capillary: 113 mg/dL — ABNORMAL HIGH (ref 70–99)
Glucose-Capillary: 125 mg/dL — ABNORMAL HIGH (ref 70–99)
Glucose-Capillary: 138 mg/dL — ABNORMAL HIGH (ref 70–99)

## 2021-07-30 MED ORDER — GNP PEPPERMINT SPIRIT SPRT
Status: DC | PRN
  Filled 2021-07-30: qty 30

## 2021-07-30 MED ORDER — CHLORHEXIDINE GLUCONATE CLOTH 2 % EX PADS
6.0000 | MEDICATED_PAD | Freq: Every day | CUTANEOUS | Status: DC
  Administered 2021-08-04: 6 via TOPICAL

## 2021-07-30 NOTE — Plan of Care (Signed)
  Problem: Education: Goal: Knowledge of General Education information will improve Description: Including pain rating scale, medication(s)/side effects and non-pharmacologic comfort measures Outcome: Progressing   Problem: Health Behavior/Discharge Planning: Goal: Ability to manage health-related needs will improve Outcome: Progressing   Problem: Clinical Measurements: Goal: Ability to maintain clinical measurements within normal limits will improve Outcome: Progressing Goal: Will remain free from infection Outcome: Progressing Goal: Diagnostic test results will improve Outcome: Progressing Goal: Respiratory complications will improve Outcome: Progressing Goal: Cardiovascular complication will be avoided Outcome: Progressing   Problem: Activity: Goal: Risk for activity intolerance will decrease Outcome: Progressing   Problem: Nutrition: Goal: Adequate nutrition will be maintained Outcome: Progressing   Problem: Coping: Goal: Level of anxiety will decrease Outcome: Progressing   Problem: Elimination: Goal: Will not experience complications related to bowel motility Outcome: Progressing Goal: Will not experience complications related to urinary retention Outcome: Progressing   Problem: Pain Managment: Goal: General experience of comfort will improve Outcome: Progressing   Problem: Safety: Goal: Ability to remain free from injury will improve Outcome: Progressing   Problem: Skin Integrity: Goal: Risk for impaired skin integrity will decrease Outcome: Progressing   Problem: Education: Goal: Knowledge of disease and its progression will improve Outcome: Progressing Goal: Individualized Educational Video(s) Outcome: Progressing   Problem: Fluid Volume: Goal: Compliance with measures to maintain balanced fluid volume will improve Outcome: Progressing   Problem: Health Behavior/Discharge Planning: Goal: Ability to manage health-related needs will  improve Outcome: Progressing   Problem: Nutritional: Goal: Ability to make healthy dietary choices will improve Outcome: Progressing   Problem: Clinical Measurements: Goal: Complications related to the disease process, condition or treatment will be avoided or minimized Outcome: Progressing   Problem: Metabolic: Goal: Ability to maintain appropriate glucose levels will improve Outcome: Progressing

## 2021-07-30 NOTE — Progress Notes (Signed)
Updated DR. Hall with pt's BP 84/55, 74/53, 85/59 ( MAP 67) respectively after albumin administration. Notified MD Manual BP was 86/58, pt is resting in bed asymptomatic and lying down with her head of bed abt 30 degrees that's what she can tolarate. . Per MD monitor pt until she is symptomatic at this point, a Will continue to monitor.

## 2021-07-30 NOTE — Progress Notes (Signed)
Patient ID: [name], female   DOB: [birth year], 55 y.o.   MRN: [id number] [facility] Progress Note   Assessment/ Plan:   1.  Acute exacerbation of chronic right ventricular heart failure with pulmonary hypertension/shock: Sildenafil was discontinued and she remains on high-dose midodrine for blood pressure support.  Continue volume management with hemodialysis and ultrafiltration.  2. ESRD:  - Continue HD per a Monday/Wednesday/Friday schedule.  (keep systolic blood pressure >15).   - At this time, her deconditioning limits her ability to undertake dialysis in recliner and as such, cannot go back to her outpatient dialysis unit until she has significant improvement of muscular strength - Out of bed to chair as tolerated  - do not feel comfortable trying HD in recliner until she has successfully tolerated sitting in a chair  - albumin with HD   3. Anemia of CKD: Without overt blood loss; continue aranesp 35mg every Wednesday and follow trends. CBC in am  4. CKD-MBD: Continue Sensipar for PTH control.  Calcium and phosphorus levels currently at acceptable levels with calcium acetate for phosphorus binder    5. Nutrition: changed to renal diet  6. Chronic hypotension: Blood pressures at goal on TID midodrine; monitor with hemodialysis/ultrafiltration.  EDW 135.5 kg prior to admission and currently about 10 kg over-continue efforts at UF with HD.  Do note that her dry weight may ultimately need to be raised  Disposition - At this time, her deconditioning limits her ability to undertake dialysis in recliner and as such, cannot go back to her outpatient dialysis unit until she has significant improvement of muscular strength/gait.  Will discuss again with SW to update  Subjective:   Last HD on 8/10 with 3.3 kg UF.  Hypotension overnight.  She wasn't able to get into the chair today because when she tried to move from bed to chair her leg got stuck and she's had considerable  nausea since.    Review of systems:  Reports nausea after she experienced pain trying to get from the bed to the chair; didn't have before but this has persisted. She denies shortness of breath   Denies chest pain     Objective:   BP 104/62 (BP Location: Right Wrist)   Pulse 79   Temp 97.9 F (36.6 C) (Oral)   Resp 18   Ht 5' 5" (1.651 m)   Wt (!) 144.4 kg   SpO2 92%   BMI 52.98 kg/m   Physical Exam:   General adult female in bed in no acute distress HEENT normocephalic atraumatic extraocular movements intact sclera anicteric Neck supple trachea midline Lungs clear to auscultation bilaterally normal work of breathing at rest Heart S1S2 no rub Abdomen soft nontender nondistended; obese habitus Extremities chronic lymphedema  Psych normal mood and affect Neuro - alert and oriented x 3 provides hx and follows commands Access: LUE AVF   Labs: BMET Recent Labs  Lab 07/24/21 0838 07/27/21 0703 07/29/21 0930  NA 133* 133* 133*  K 4.3 4.3 4.0  CL 94* 93* 96*  CO2 _0 GLUCOSE 132* 117* 89  BUN 46* 54* 37*  CREATININE 5.47* 6.64* 5.22*  CALCIUM 9.4 9.6 8.4*  PHOS 4.0 3.6 2.9   CBC Recent Labs  Lab 07/24/21 0838 07/27/21 0703 07/29/21 0040  WBC 8.5 9.6 8.8  HGB 10.7* 10.4* 9.9*  HCT 34.9* 32.4* 31.2*  MCV 99.1 97.3 96.6  PLT 171 157 193      Medications:  amiodarone  200 mg Oral Daily   apixaban  5 mg Oral BID   calcium acetate  667 mg Oral TID WC   Chlorhexidine Gluconate Cloth  6 each Topical Daily   Chlorhexidine Gluconate Cloth  6 each Topical Q0600   Chlorhexidine Gluconate Cloth  6 each Topical Q0600   cinacalcet  60 mg Oral Q M,W,F-HD   darbepoetin (ARANESP) injection - DIALYSIS  60 mcg Intravenous Q Wed-HD   DULoxetine  20 mg Oral Daily   feeding supplement (NEPRO CARB STEADY)  237 mL Oral TID BM   insulin aspart  0-15 Units Subcutaneous TID WC   insulin aspart  0-5 Units Subcutaneous QHS   mouth rinse  15 mL Mouth Rinse BID    midodrine  15 mg Oral TID WC   multivitamin  1 tablet Oral QHS   polyethylene glycol  17 g Oral Daily   senna  1 tablet Oral Daily   Claudia Desanctis, MD 07/30/2021, 11:44 AM

## 2021-07-30 NOTE — Progress Notes (Signed)
Occupational Therapy Treatment Patient Details Name: [name] MRN: [id number] DOB: [birth year] Today's Date: 07/30/2021    History of present illness 55 y.o. female presenting from [place] ED 7/3 with lethargy, hypotension and elevated lactic acid after d/c from [facility] earlier that day with septic shock and cellulitis. Patient found to have AMS, septic shock, acute respiratory failure, acute on chronic RV failure. Transferred to ICU on 7/12 due to hypotension. PMHX significant for obesity, DMII, COPD, CAD, lymphedema, ESRD on HD MWF and CHF.   OT comments  Pt remains motivated to participate in therapy sessions though greatly limited by pain today and unable to successfully trial standing or transfers this AM. Encouraged pt to use chair position feature in bed to improve sitting tolerance for OP HD. Updated DC recs to include LTACH due to continued limitations due to deficits. Encouraged continued tilting with staff outside of therapy session to improve tolerance for WB through B LE.    Follow Up Recommendations  SNF;LTACH;Supervision/Assistance - 24 hour    Equipment Recommendations  Hospital bed    Recommendations for Other Services      Precautions / Restrictions Precautions Precautions: Fall Precaution Comments: bil LE edema, L LE/groin wounds Restrictions Weight Bearing Restrictions: No       Mobility Bed Mobility Overal bed mobility: Needs Assistance Bed Mobility: Supine to Sit;Sit to Supine;Rolling Rolling: Mod assist;+2 for safety/equipment   Supine to sit: Modified independent (Device/Increase time);HOB elevated Sit to supine: Mod assist;+2 for physical assistance   General bed mobility comments: HOB elevated with use of rail and increased time pt able to pivot to left side of bed without physical assist. Return to bed with assist to lift legs to surface. Pt able to slide toward [facility] with rails with min/mod assist. Mod A x 1-2 for rolling and adjustment of bed pads     Transfers                 General transfer comment: unable to attempt due to pain    Balance Overall balance assessment: Needs assistance Sitting-balance support: No upper extremity supported;Feet unsupported Sitting balance-Leahy Scale: Fair Sitting balance - Comments: supervision EOB with static sitting and weight shifting                                   ADL either performed or assessed with clinical judgement   ADL Overall ADL's : Needs assistance/impaired                     Lower Body Dressing: Total assistance;Sitting/lateral leans Lower Body Dressing Details (indicate cue type and reason): total assist to don socks               General ADL Comments: Session focused on progressing standing and taking steps for ADL transfers though limited by pain and unable to get OOB this AM     Vision   Vision Assessment?: No apparent visual deficits   Perception     Praxis      Cognition Arousal/Alertness: Awake/alert Behavior During Therapy: WFL for tasks assessed/performed Overall Cognitive Status: Within Functional Limits for tasks assessed                                          Exercises     Shoulder Instructions  General Comments BP WFL during session, able to complete session on RA    Pertinent Vitals/ Pain       Pain Assessment: Faces Faces Pain Scale: Hurts whole lot Pain Location: L LE with movement and in dependent position Pain Descriptors / Indicators: Grimacing;Guarding;Sore;Burning;Aching Pain Intervention(s): Premedicated before session;Monitored during session;Limited activity within patient's tolerance;Heat applied  Home Living                                          Prior Functioning/Environment              Frequency  Min 2X/week        Progress Toward Goals  OT Goals(current goals can now be found in the care plan section)  Progress towards OT goals:  OT to reassess next treatment  Acute Rehab OT Goals Patient Stated Goal: get stronger and return home OT Goal Formulation: With patient Time For Goal Achievement: 08/06/21 Potential to Achieve Goals: Fair ADL Goals Pt Will Perform Grooming: sitting;with set-up Pt Will Perform Upper Body Dressing: with set-up;sitting Pt Will Transfer to Toilet: with max assist;with +2 assist;stand pivot transfer;bedside commode Pt Will Perform Toileting - Clothing Manipulation and hygiene: with mod assist;sit to/from stand;sitting/lateral leans;with adaptive equipment Pt/caregiver will Perform Home Exercise Program: Increased strength;Both right and left upper extremity;With theraband;Independently;With written HEP provided Additional ADL Goal #1: Pt will tolerate 10 min sitting EOB dynamically with supervision as precursor to ADLs  Plan Frequency remains appropriate;Discharge plan needs to be updated    Co-evaluation    PT/OT/SLP Co-Evaluation/Treatment: Yes Reason for Co-Treatment: For patient/therapist safety;To address functional/ADL transfers   OT goals addressed during session: ADL's and self-care;Strengthening/ROM      AM-PAC OT "6 Clicks" Daily Activity     Outcome Measure   Help from another person eating meals?: None Help from another person taking care of personal grooming?: A Little Help from another person toileting, which includes using toliet, bedpan, or urinal?: Total Help from another person bathing (including washing, rinsing, drying)?: A Lot Help from another person to put on and taking off regular upper body clothing?: A Little Help from another person to put on and taking off regular lower body clothing?: Total 6 Click Score: 14    End of Session    OT Visit Diagnosis: Other abnormalities of gait and mobility (R26.89);Muscle weakness (generalized) (M62.81);Pain Pain - Right/Left: Left Pain - part of body: Leg   Activity Tolerance Patient limited by pain   Patient Left  in bed;with call bell/phone within reach   Nurse Communication Mobility status;Other (comment) (pain)        Time: 3833-3832 OT Time Calculation (min): 30 min  Charges: OT General Charges $OT Visit: 1 Visit OT Treatments $Therapeutic Activity: 8-22 mins  Malachy Chamber, OTR/L Acute Rehab Services Office: [phone]    [name] 07/30/2021, 11:16 AM

## 2021-07-30 NOTE — Progress Notes (Signed)
Orthopedic Tech Progress Note Patient Details:  [name] [birth year] [id number]  Ortho Devices Type of Ortho Device: Haematologist Ortho Device/Splint Location: bil Ortho Device/Splint Interventions: Ordered, Application, Adjustment   Post Interventions Patient Tolerated: Well Instructions Provided: Care of device, Poper ambulation with device  Bil unna boots applied per order.  Made sure to go down to toes on L side per PT recommendations.   Thanks,  Verdene Lennert, PT, DPT  Acute Rehabilitation Ortho Tech Supervisor [phone] pager #[phone] office     [name] 07/30/2021, 2:43 PM

## 2021-07-30 NOTE — Progress Notes (Signed)
PROGRESS NOTE  [name]  YQM:[id number] DOB: 1966-12-06 DOA: 06/21/2021 PCP: Noreene Larsson, NP   Brief Narrative: 55 year old female with a history of end-stage renal disease on hemodialysis, has had a prolonged hospitalization since she was admitted on 06/21/2021 for cellulitis and concerns for sepsis.  She was treated with antibiotics, but had continued issues with hypotension.  Upon further evaluation, it was felt that her hypotension was more related to cardiogenic shock from RV failure and pulmonary hypertension group 3.  She is being followed by cardiology, nephrology and PCCM.  She is intermittently required vasopressors for hypotension.  She has been off vasopressors since 7/20 and has tolerated hemodialysis.  Currently on midodrine.  Transferred to [facility] service on 7/23.  Overnight on 7/26 patient became tachycardic and hypotensive concerns for left lower extremity cellulitis/sepsis started on IV daptomycin and cefepime.  CT of the thigh was ordered, patient was seen by wound care. Infectious disease team consulted, remove midline and start linezolid twice daily. Hemodialysis continues to assist with volume removal.   Assessment & Plan: Principal Problem:   Hypotension Active Problems:   Lymphedema of lower extremity   Morbid obesity (HCC)   Obesity hypoventilation syndrome (HCC)   Cellulitis   ESRD on dialysis ([place])   Staphylococcus epidermidis bacteremia   Elevated troponin   Chronic right heart failure (HCC)   Type 2 diabetes mellitus with hyperglycemia (HCC)   Lymphedema   Cardiogenic shock (HCC)   Bilateral calf pain   Pressure injury of skin   Pulmonary hypertension (HCC)  Cardiogenic shock due to RV failure, resolving Pulmonary hypertension, stable Acute on chronic diastolic congestive heart failure, resolving - Off vasopressors since 7/20, continuing midodrine at high dose. If hypotensive, recommend small volume boluses. - Continue HD MWF - defer to nephrology - Pt  will need to be able to transfer to HD chair and sit for full treatment prior to discharge. Making progress with PT/OT slowly, unfortunately this does limit her disposition options as she cannot discharge to SNF or really any outpatient facility until she is able to sit in chair and tolerate hemodialysis. - Needs advanced heart failure clinic follow up (signed off during this hospitalization) post discharge  Left lower extremity 5 cm X 11 cm full-thickness wound in medial and posterior upper thigh: Cellulitis thought to have been ruled out per ID. CT only showed chronic inflammation and lymphedema - Continue BID dressing changes.  - Compression stockings, unna boots for lymphedema - No evidence of infection on exam.  - Continue dilaudid in place of oxycodone given ESRD - continue to wean as tolerated  Infected RUE midline catheter: Has been discontinued.  - Completed linezolid x5 days after midline removal per ID recommendation, completed.   Atypical atrial flutter: NSR s/p DCCV 7/15.  - Continue amiodarone, eliquis.    ESRD: AVF largely occluded, so ligation not required at this time.  - HD as above per nephrology. Needs to be able to sit up in chair to discharge for outpatient HD. Encouraged frequent PT/OT/mobility per RN.   Hyperkalemia:  - Managed by dialysis   OSA: Suspected - Continue CPAP qHS - Outpatient sleep study recommended.    Fibromyalgia - Continue duloxetine.   Anemia of chronic kidney disease: Stable without bleeding.  - ESA per nephrology.  - With current protracted hospitalization and suspected prolongation from this point, I'm hoping we can decrease frequency of blood draws.  T2DM with hyperglycemia: HbA1c 6.9%.  - Continue SSI, very modest requirements but  does get some insulin regularly, so will continue checks and coverage to optimize wound healing.  - Continue carb-limited diet (in addition to heart healthy/renal)   Weakness:  - Continue PT/OT efforts. PT  recommends SNF placement. Mobility improving with volume removal and continued therapy. Dispo limited by ambulation and inability to have HD in seated position as above.   Goals of care discussion: Due to multiple medical comorbidities and acute ongoing issues.  Palliative care team was consulted.  Patient wishes to be full code with full scope of treatment, hopeful for improvement and going to rehab.   RN Pressure Injury Documentation: Pressure Injury 07/04/21 Thigh Posterior;Proximal;Right Stage 2 -  Partial thickness loss of dermis presenting as a shallow open injury with a red, pink wound bed without slough. (Active)  07/04/21 1200  Location: Thigh  Location Orientation: Posterior;Proximal;Right  Staging: Stage 2 -  Partial thickness loss of dermis presenting as a shallow open injury with a red, pink wound bed without slough.  Wound Description (Comments):   Present on Admission: No   Obesity: Estimated body mass index is 52.98 kg/m as calculated from the following:   Height as of this encounter: 5' 5" (1.651 m).   Weight as of this encounter: 144.4 kg.  DVT prophylaxis: Eliquis Code Status: Full Family Communication: None at bedside Disposition Plan:  Status is: Inpatient  Remains inpatient appropriate because:Inpatient level of care appropriate due to severity of illness. Needs improvement in volume status with HD which will facilitate mobility which will allow her to transfer and remain in chair for duration of hemodialysis sessions as an outpatient.  Dispo: The patient is from: Home              Anticipated d/c is to: SNF              Patient currently is medically stable to d/c. Awaiting safe dispo location and ability to tolerate HD seated   Difficult to place patient No  Consultants:  Nephrology ID  Subjective: Patient moderately hypotensive overnight but remained completely asymptomatic throughout the duration, blood pressure much more appropriate this morning although  complaining of ongoing nausea review of systems otherwise negative.  Objective: Vitals:   07/30/21 0403 07/30/21 0411 07/30/21 0621 07/30/21 0717  BP: (!) 88/61  (!) 91/53 94/60  Pulse: (!) 110  (!) 110 (!) 110  Resp: _0 Temp: 98.8 F (37.1 C)   98.1 F (36.7 C)  TempSrc: Oral   Oral  SpO2:    97%  Weight:  (!) 144.4 kg    Height:        Intake/Output Summary (Last 24 hours) at 07/30/2021 0801 Last data filed at 07/30/2021 0300 Gross per 24 hour  Intake 150 ml  Output 3300 ml  Net -3150 ml      Filed Weights   07/29/21 0848 07/29/21 1218 07/30/21 0411  Weight: (!) 145.6 kg (!) 142.2 kg (!) 144.4 kg   Gen: 55 y.o. female in no distress Pulm: Nonlabored breathing, clear.. CV: Regular rate and rhythm. No murmur, rub, or gallop. UTD, no definite JVD, stable dependent edema. GI: Abdomen soft, non-tender, non-distended, with normoactive bowel sounds.  Ext: Warm, no deformities. LUA accessed. Skin: No new rashes, lesions or ulcers on visualized skin. Neuro: Alert and oriented. No focal neurological deficits. Psych: Judgement and insight appear fair. Mood euthymic & affect congruent. Behavior is appropriate.    Data Reviewed: I have personally reviewed following labs and imaging studies  CBC: Recent Labs  Lab 07/24/21 0838 07/27/21 0703 07/29/21 0040  WBC 8.5 9.6 8.8  HGB 10.7* 10.4* 9.9*  HCT 34.9* 32.4* 31.2*  MCV 99.1 97.3 96.6  PLT 171 157 241    Basic Metabolic Panel: Recent Labs  Lab 07/24/21 0838 07/27/21 0703 07/29/21 0930  NA 133* 133* 133*  K 4.3 4.3 4.0  CL 94* 93* 96*  CO2 _0 GLUCOSE 132* 117* 89  BUN 46* 54* 37*  CREATININE 5.47* 6.64* 5.22*  CALCIUM 9.4 9.6 8.4*  PHOS 4.0 3.6 2.9    GFR: Estimated Creatinine Clearance: 17.9 mL/min (A) (by C-G formula based on SCr of 5.22 mg/dL (H)). Liver Function Tests: Recent Labs  Lab 07/24/21 0838 07/27/21 0703 07/29/21 0930  ALBUMIN 3.2* 3.5 3.9    No results for input(s):  LIPASE, AMYLASE in the last 168 hours. No results for input(s): AMMONIA in the last 168 hours. Coagulation Profile: No results for input(s): INR, PROTIME in the last 168 hours.  Cardiac Enzymes: No results for input(s): CKTOTAL, CKMB, CKMBINDEX, TROPONINI in the last 168 hours.  BNP (last 3 results) No results for input(s): PROBNP in the last 8760 hours. HbA1C: No results for input(s): HGBA1C in the last 72 hours. CBG: Recent Labs  Lab 07/29/21 0625 07/29/21 1316 07/29/21 1755 07/29/21 2131 07/30/21 0611  GLUCAP 98 101* 108* 115* 125*      Recent Results (from the past 240 hour(s))  Surgical pcr screen     Status: None   Collection Time: 07/27/21 10:47 PM   Specimen: Nasal Mucosa; Nasal Swab  Result Value Ref Range Status   MRSA, PCR NEGATIVE NEGATIVE Final   Staphylococcus aureus NEGATIVE NEGATIVE Final    Comment: (NOTE) The Xpert SA Assay (FDA approved for NASAL specimens in patients 64 years of age and older), is one component of a comprehensive surveillance program. It is not intended to diagnose infection nor to guide or monitor treatment. Performed at [facility], [place] [address] [place], [place] [address]        Radiology Studies: No results found.  Scheduled Meds:  amiodarone  200 mg Oral Daily   apixaban  5 mg Oral BID   calcium acetate  667 mg Oral TID WC   Chlorhexidine Gluconate Cloth  6 each Topical Daily   Chlorhexidine Gluconate Cloth  6 each Topical Q0600   Chlorhexidine Gluconate Cloth  6 each Topical Q0600   cinacalcet  60 mg Oral Q M,W,F-HD   darbepoetin (ARANESP) injection - DIALYSIS  60 mcg Intravenous Q Wed-HD   DULoxetine  20 mg Oral Daily   feeding supplement (NEPRO CARB STEADY)  237 mL Oral TID BM   insulin aspart  0-15 Units Subcutaneous TID WC   insulin aspart  0-5 Units Subcutaneous QHS   mouth rinse  15 mL Mouth Rinse BID   midodrine  15 mg Oral TID WC   multivitamin  1 tablet Oral QHS   polyethylene glycol  17 g Oral  Daily   senna  1 tablet Oral Daily   Continuous Infusions:  sodium chloride     promethazine (PHENERGAN) injection (IM or IVPB) Stopped (07/02/21 1831)     LOS: 39 days   Time spent: 25 minutes.  Little Ishikawa, DO [facility] [email or web address] 07/30/2021, 8:01 AM

## 2021-07-30 NOTE — Progress Notes (Signed)
Physical Therapy Treatment Patient Details Name: [name] MRN: [id number] DOB: 1966/08/29 Today's Date: 07/30/2021    History of Present Illness 55 y.o. female presenting from [place] ED 7/3 with lethargy, hypotension and elevated lactic acid after d/c from [facility] earlier that day with septic shock and cellulitis. Patient found to have AMS, septic shock, acute respiratory failure, acute on chronic RV failure. Transferred to ICU on 7/12 due to hypotension. PMHX significant for obesity, DMII, COPD, CAD, lymphedema, ESRD on HD MWF and CHF.    PT Comments    Pt limited today with LLE pain while sitting EOB preparing to try to stand. Pt with difficulty scooting to EOB to get feet to ground. Unable to attempt stand at that point.    Follow Up Recommendations  SNF     Equipment Recommendations  Other (comment);Wheelchair cushion (measurements PT);Wheelchair (measurements PT);Hospital bed (hoyer)    Recommendations for Other Services       Precautions / Restrictions Precautions Precautions: Fall Precaution Comments: bil LE edema, L LE/groin wounds    Mobility  Bed Mobility Overal bed mobility: Needs Assistance Bed Mobility: Supine to Sit;Sit to Supine;Rolling Rolling: Mod assist;+2 for safety/equipment   Supine to sit: Modified independent (Device/Increase time);HOB elevated Sit to supine: Mod assist;+2 for physical assistance   General bed mobility comments: HOB elevated with use of rail and increased time pt able to pivot to left side of bed without physical assist. Return to bed with assist to lift legs to surface. Pt able to slide toward [facility] with rails with min/mod assist. Mod A x 1-2 for rolling and adjustment of bed pads    Transfers                 General transfer comment: unable to attempt due to pain  Ambulation/Gait                 Stairs             Wheelchair Mobility    Modified Rankin (Stroke Patients Only)       Balance Overall  balance assessment: Needs assistance Sitting-balance support: No upper extremity supported;Feet unsupported Sitting balance-Leahy Scale: Fair Sitting balance - Comments: supervision EOB with static sitting and weight shifting                                    Cognition Arousal/Alertness: Awake/alert Behavior During Therapy: WFL for tasks assessed/performed Overall Cognitive Status: Within Functional Limits for tasks assessed                                        Exercises      General Comments        Pertinent Vitals/Pain Pain Assessment: Faces Faces Pain Scale: Hurts whole lot Pain Location: L LE with movement and in dependent position Pain Descriptors / Indicators: Grimacing;Guarding;Sore;Burning;Aching    Home Living                      Prior Function            PT Goals (current goals can now be found in the care plan section) Progress towards PT goals: Not progressing toward goals - comment    Frequency    Min 2X/week      PT Plan Current plan remains  appropriate    Co-evaluation PT/OT/SLP Co-Evaluation/Treatment: Yes Reason for Co-Treatment: For patient/therapist safety;To address functional/ADL transfers          AM-PAC PT "6 Clicks" Mobility   Outcome Measure  Help needed turning from your back to your side while in a flat bed without using bedrails?: A Lot Help needed moving from lying on your back to sitting on the side of a flat bed without using bedrails?: A Little Help needed moving to and from a bed to a chair (including a wheelchair)?: Total Help needed standing up from a chair using your arms (e.g., wheelchair or bedside chair)?: Total Help needed to walk in hospital room?: Total Help needed climbing 3-5 steps with a railing? : Total 6 Click Score: 9    End of Session   Activity Tolerance: Patient limited by pain Patient left: in bed;with call bell/phone within reach   PT Visit Diagnosis:  Other abnormalities of gait and mobility (R26.89);Muscle weakness (generalized) (M62.81);Pain Pain - Right/Left: Left Pain - part of body: Leg     Time: 6015-6153 PT Time Calculation (min) (ACUTE ONLY): 31 min  Charges:  $Therapeutic Activity: 8-22 mins                     [place] Pager [phone] Office [place] 07/30/2021, 5:05 PM

## 2021-07-30 NOTE — Progress Notes (Signed)
Palliative:  HPI: 55 y.o. female  with past medical history of ESRD on HD MWF, hypertension, diabetes, CHF, CAD, COPD, chronic lymphedema left leg, thyroid cancer, Chiari malformation, fibromyalagia admitted on 06/21/2021 with increased right hip and leg pain with recent cellulitis admission but return to ED with lethargy, hypotension, elevated lactic acid septic shock requiring ICU and vasopressors and found to be in cardiogenic shock from RV failure and group 3 pulmonary hypertension from untreated sleep apnea. Ongoing issues with hypotension limiting dialysis although off vasopressors since 7/20. Concerns noted that fistula may be contributing to hypotension and low cardiac output but not thought likely after right heart cath.    I met today with Malachy Mood. No visitors at bedside. She is having left hip/leg pain after attempts to move and work with therapy. She has received pain medication but is also nauseated from pain - RN notified to bring Zofran. Yanna is feeling unwell but continues to express her desire to continue to work with therapy. She is very strong in her faith and feels strongly that she will be able to ultimately progress be able to sit in chair and continue dialysis. She continues her request for full code, full scope care. We did discuss Advance Directives and I provided her with Living Will to review and complete. No changes in goals of care.   Exam: Alert, oriented. Pain and nausea. Severe weakness and debility. Breathing regular, unlabored. Abd flat.   Plan: - Full code, full scope.  - Spiritual care consult to follow up on Advance Directive.   25 min  Vinie Sill, NP Palliative Medicine Team Pager [phone] (Please see [email or web address] for schedule) Team Phone [phone]    Greater than 50%  of this time was spent counseling and coordinating care related to the above assessment and plan

## 2021-07-31 DIAGNOSIS — R5381 Other malaise: Secondary | ICD-10-CM | POA: Diagnosis not present

## 2021-07-31 DIAGNOSIS — Z7189 Other specified counseling: Secondary | ICD-10-CM | POA: Diagnosis not present

## 2021-07-31 DIAGNOSIS — N186 End stage renal disease: Secondary | ICD-10-CM | POA: Diagnosis not present

## 2021-07-31 DIAGNOSIS — Z515 Encounter for palliative care: Secondary | ICD-10-CM | POA: Diagnosis not present

## 2021-07-31 LAB — CBC
HCT: 33.2 % — ABNORMAL LOW (ref 36.0–46.0)
Hemoglobin: 10.5 g/dL — ABNORMAL LOW (ref 12.0–15.0)
MCH: 30.5 pg (ref 26.0–34.0)
MCHC: 31.6 g/dL (ref 30.0–36.0)
MCV: 96.5 fL (ref 80.0–100.0)
Platelets: 250 10*3/uL (ref 150–400)
RBC: 3.44 MIL/uL — ABNORMAL LOW (ref 3.87–5.11)
RDW: 18.1 % — ABNORMAL HIGH (ref 11.5–15.5)
WBC: 7.7 10*3/uL (ref 4.0–10.5)
nRBC: 0.3 % — ABNORMAL HIGH (ref 0.0–0.2)

## 2021-07-31 LAB — GLUCOSE, CAPILLARY
Glucose-Capillary: 101 mg/dL — ABNORMAL HIGH (ref 70–99)
Glucose-Capillary: 115 mg/dL — ABNORMAL HIGH (ref 70–99)
Glucose-Capillary: 116 mg/dL — ABNORMAL HIGH (ref 70–99)
Glucose-Capillary: 97 mg/dL (ref 70–99)

## 2021-07-31 LAB — RENAL FUNCTION PANEL
Albumin: 3.8 g/dL (ref 3.5–5.0)
Anion gap: 14 (ref 5–15)
BUN: 42 mg/dL — ABNORMAL HIGH (ref 6–20)
CO2: 24 mmol/L (ref 22–32)
Calcium: 8.1 mg/dL — ABNORMAL LOW (ref 8.9–10.3)
Chloride: 94 mmol/L — ABNORMAL LOW (ref 98–111)
Creatinine, Ser: 5.4 mg/dL — ABNORMAL HIGH (ref 0.44–1.00)
GFR, Estimated: 9 mL/min — ABNORMAL LOW (ref >60)
Glucose, Bld: 96 mg/dL (ref 70–99)
Phosphorus: 3.2 mg/dL (ref 2.5–4.6)
Potassium: 4.6 mmol/L (ref 3.5–5.1)
Sodium: 132 mmol/L — ABNORMAL LOW (ref 135–145)

## 2021-07-31 MED ORDER — MIDODRINE HCL 5 MG PO TABS
ORAL_TABLET | ORAL | Status: AC
  Administered 2021-07-31: 15 mg via ORAL
  Filled 2021-07-31: qty 1

## 2021-07-31 MED ORDER — ALBUMIN HUMAN 25 % IV SOLN
INTRAVENOUS | Status: AC
  Administered 2021-07-31: 25 g
  Filled 2021-07-31: qty 100

## 2021-07-31 MED ORDER — MIDODRINE HCL 5 MG PO TABS
ORAL_TABLET | ORAL | Status: AC
  Filled 2021-07-31: qty 2

## 2021-07-31 NOTE — Progress Notes (Addendum)
New interdry cloth applied to bilateral inguinal areas as ordered.

## 2021-07-31 NOTE — Progress Notes (Signed)
PROGRESS NOTE  [name]  YQM:[id number] DOB: 1966-12-06 DOA: 06/21/2021 PCP: Noreene Larsson, NP   Brief Narrative: 55 year old female with a history of end-stage renal disease on hemodialysis, has had a prolonged hospitalization since she was admitted on 06/21/2021 for cellulitis and concerns for sepsis.  She was treated with antibiotics, but had continued issues with hypotension.  Upon further evaluation, it was felt that her hypotension was more related to cardiogenic shock from RV failure and pulmonary hypertension group 3.  She is being followed by cardiology, nephrology and PCCM.  She is intermittently required vasopressors for hypotension.  She has been off vasopressors since 7/20 and has tolerated hemodialysis.  Currently on midodrine.  Transferred to [facility] service on 7/23.  Overnight on 7/26 patient became tachycardic and hypotensive concerns for left lower extremity cellulitis/sepsis started on IV daptomycin and cefepime.  CT of the thigh was ordered, patient was seen by wound care. Infectious disease team consulted, remove midline and start linezolid twice daily. Hemodialysis continues to assist with volume removal.   Assessment & Plan: Principal Problem:   Hypotension Active Problems:   Lymphedema of lower extremity   Morbid obesity (HCC)   Obesity hypoventilation syndrome (HCC)   Cellulitis   ESRD on dialysis ([place])   Staphylococcus epidermidis bacteremia   Elevated troponin   Chronic right heart failure (HCC)   Type 2 diabetes mellitus with hyperglycemia (HCC)   Lymphedema   Cardiogenic shock (HCC)   Bilateral calf pain   Pressure injury of skin   Pulmonary hypertension (HCC)  Cardiogenic shock due to RV failure, resolving Pulmonary hypertension, stable Acute on chronic diastolic congestive heart failure, resolving - Off vasopressors since 7/20, continuing midodrine at high dose. If hypotensive, recommend small volume boluses. - Continue HD MWF - defer to nephrology - Pt  will need to be able to transfer to HD chair and sit for full treatment prior to discharge. Making progress with PT/OT slowly, unfortunately this does limit her disposition options as she cannot discharge to SNF or really any outpatient facility until she is able to sit in chair and tolerate hemodialysis. - Needs advanced heart failure clinic follow up (signed off during this hospitalization) post discharge  Left lower extremity 5 cm X 11 cm full-thickness wound in medial and posterior upper thigh: Cellulitis thought to have been ruled out per ID. CT only showed chronic inflammation and lymphedema - Continue BID dressing changes.  - Compression stockings, unna boots for lymphedema - No evidence of infection on exam.  - Continue dilaudid in place of oxycodone given ESRD - continue to wean as tolerated  Infected RUE midline catheter: Has been discontinued.  - Completed linezolid x5 days after midline removal per ID recommendation, completed.   Atypical atrial flutter: NSR s/p DCCV 7/15.  - Continue amiodarone, eliquis.    ESRD: AVF largely occluded, so ligation not required at this time.  - HD as above per nephrology. Needs to be able to sit up in chair to discharge for outpatient HD. Encouraged frequent PT/OT/mobility per RN.   Hyperkalemia:  - Managed by dialysis   OSA: Suspected - Continue CPAP qHS - Outpatient sleep study recommended.    Fibromyalgia - Continue duloxetine.   Anemia of chronic kidney disease: Stable without bleeding.  - ESA per nephrology.  - With current protracted hospitalization and suspected prolongation from this point, I'm hoping we can decrease frequency of blood draws.  T2DM with hyperglycemia: HbA1c 6.9%.  - Continue SSI, very modest requirements but  does get some insulin regularly, so will continue checks and coverage to optimize wound healing.  - Continue carb-limited diet (in addition to heart healthy/renal)   Weakness:  - Continue PT/OT efforts. PT  recommends SNF placement. Mobility improving with volume removal and continued therapy. Dispo limited by ambulation and inability to have HD in seated position as above.   Goals of care discussion: Due to multiple medical comorbidities and acute ongoing issues.  Palliative care team was consulted.  Patient wishes to be full code with full scope of treatment, hopeful for improvement and going to rehab.   RN Pressure Injury Documentation: Pressure Injury 07/04/21 Thigh Posterior;Proximal;Right Stage 2 -  Partial thickness loss of dermis presenting as a shallow open injury with a red, pink wound bed without slough. (Active)  07/04/21 1200  Location: Thigh  Location Orientation: Posterior;Proximal;Right  Staging: Stage 2 -  Partial thickness loss of dermis presenting as a shallow open injury with a red, pink wound bed without slough.  Wound Description (Comments):   Present on Admission: No   Obesity: Estimated body mass index is 51.87 kg/m as calculated from the following:   Height as of this encounter: _0  (1.651 m).   Weight as of this encounter: 141.4 kg.  DVT prophylaxis: Eliquis Code Status: Full Family Communication: None at bedside Disposition Plan:  Status is: Inpatient  Remains inpatient appropriate because:Inpatient level of care appropriate due to severity of illness. Needs improvement in volume status with HD which will facilitate mobility which will allow her to transfer and remain in chair for duration of hemodialysis sessions as an outpatient.  Dispo: The patient is from: Home              Anticipated d/c is to: SNF              Patient currently is medically stable to d/c. Awaiting safe dispo location and ability to tolerate HD seated   Difficult to place patient No  Consultants:  Nephrology ID  Subjective: No acute issues or events overnight, patient has recurrent episodes of nausea again today, of note she has worsening symptoms on dialysis days questionably in  the setting of uremia.  Otherwise denies headache fevers chills shortness of breath chest pain diarrhea constipation  Objective: Vitals:   07/30/21 2326 07/31/21 0000 07/31/21 0200 07/31/21 0305  BP: 107/64 101/67 (!) 98/59 117/69  Pulse: (!) 117 (!) 117 82 77  Resp: 15 16 (!) 21 16  Temp:  99.2 F (37.3 C)  99.4 F (37.4 C)  TempSrc:  Axillary  Oral  SpO2: 91% (!) 89% 91% 95%  Weight:    (!) 141.4 kg  Height:        Intake/Output Summary (Last 24 hours) at 07/31/2021 0758 Last data filed at 07/30/2021 2322 Gross per 24 hour  Intake 360 ml  Output --  Net 360 ml      Filed Weights   07/29/21 1218 07/30/21 0411 07/31/21 0305  Weight: (!) 142.2 kg (!) 144.4 kg (!) 141.4 kg   Gen: 55 y.o. female in no distress Pulm: Nonlabored breathing, clear. CV: Regular rate and rhythm. No murmur, rub, or gallop. UTD, no definite JVD, stable dependent edema. GI: Abdomen soft, non-tender, non-distended, with normoactive bowel sounds.  Ext: Warm, no deformities. LUA accessed. Skin: No new rashes, lesions or ulcers on visualized skin. Neuro: Alert and oriented. No focal neurological deficits. Psych: Judgement and insight appear fair. Mood euthymic & affect congruent. Behavior is appropriate.  Data Reviewed: I have personally reviewed following labs and imaging studies  CBC: Recent Labs  Lab 07/24/21 0838 07/27/21 0703 07/29/21 0040 07/31/21 0037  WBC 8.5 9.6 8.8 7.7  HGB 10.7* 10.4* 9.9* 10.5*  HCT 34.9* 32.4* 31.2* 33.2*  MCV 99.1 97.3 96.6 96.5  PLT 171 157 193 282    Basic Metabolic Panel: Recent Labs  Lab 07/24/21 0838 07/27/21 0703 07/29/21 0930 07/31/21 0037  NA 133* 133* 133* 132*  K 4.3 4.3 4.0 4.6  CL 94* 93* 96* 94*  CO2 _0 GLUCOSE 132* 117* 89 96  BUN 46* 54* 37* 42*  CREATININE 5.47* 6.64* 5.22* 5.40*  CALCIUM 9.4 9.6 8.4* 8.1*  PHOS 4.0 3.6 2.9 3.2    GFR: Estimated Creatinine Clearance: 17.1 mL/min (A) (by C-G formula based on SCr of 5.4  mg/dL (H)). Liver Function Tests: Recent Labs  Lab 07/24/21 0838 07/27/21 0703 07/29/21 0930 07/31/21 0037  ALBUMIN 3.2* 3.5 3.9 3.8    No results for input(s): LIPASE, AMYLASE in the last 168 hours. No results for input(s): AMMONIA in the last 168 hours. Coagulation Profile: No results for input(s): INR, PROTIME in the last 168 hours.  Cardiac Enzymes: No results for input(s): CKTOTAL, CKMB, CKMBINDEX, TROPONINI in the last 168 hours.  BNP (last 3 results) No results for input(s): PROBNP in the last 8760 hours. HbA1C: No results for input(s): HGBA1C in the last 72 hours. CBG: Recent Labs  Lab 07/30/21 0611 07/30/21 1108 07/30/21 1553 07/30/21 2233 07/31/21 0627  GLUCAP 125* 113* 125* 138* 101*      Recent Results (from the past 240 hour(s))  Surgical pcr screen     Status: None   Collection Time: 07/27/21 10:47 PM   Specimen: Nasal Mucosa; Nasal Swab  Result Value Ref Range Status   MRSA, PCR NEGATIVE NEGATIVE Final   Staphylococcus aureus NEGATIVE NEGATIVE Final    Comment: (NOTE) The Xpert SA Assay (FDA approved for NASAL specimens in patients 61 years of age and older), is one component of a comprehensive surveillance program. It is not intended to diagnose infection nor to guide or monitor treatment. Performed at [facility], [place] [address], [place], [place] [address]        Radiology Studies: No results found.  Scheduled Meds:  amiodarone  200 mg Oral Daily   apixaban  5 mg Oral BID   calcium acetate  667 mg Oral TID WC   Chlorhexidine Gluconate Cloth  6 each Topical Daily   Chlorhexidine Gluconate Cloth  6 each Topical Q0600   Chlorhexidine Gluconate Cloth  6 each Topical Q0600   Chlorhexidine Gluconate Cloth  6 each Topical Q0600   cinacalcet  60 mg Oral Q M,W,F-HD   darbepoetin (ARANESP) injection - DIALYSIS  60 mcg Intravenous Q Wed-HD   DULoxetine  20 mg Oral Daily   feeding supplement (NEPRO CARB STEADY)  237 mL Oral TID BM    insulin aspart  0-15 Units Subcutaneous TID WC   insulin aspart  0-5 Units Subcutaneous QHS   mouth rinse  15 mL Mouth Rinse BID   midodrine  15 mg Oral TID WC   multivitamin  1 tablet Oral QHS   polyethylene glycol  17 g Oral Daily   senna  1 tablet Oral Daily   Continuous Infusions:  sodium chloride     promethazine (PHENERGAN) injection (IM or IVPB) Stopped (07/02/21 1831)     LOS: 40 days   Time spent:  25 minutes.  Little Ishikawa, DO [facility] [email or web address] 07/31/2021, 7:58 AM

## 2021-07-31 NOTE — Progress Notes (Addendum)
This chaplain responded to PMT consult for creating or updating the Pt. Advance Directive.  The Pt. expresses her interest and is able to complete Advance Directive:  HCPOA and Living Will education  with the chaplain. The Pt. is waiting for HD as we visit. The chaplain is appreciative of the communication with the Pt. RN-Hannah.  The chaplain guided the Pt. in filling out the AD.  The chaplain understands the Pt. plans to name her husband-Timothy Appleman as her healthcare agent.  The completed document without notarization was placed in the Pt. chart.  The chaplain will facilitate plans for a notary on Monday.  The Pt. visit ended with prayer and birthday wishes.

## 2021-07-31 NOTE — Progress Notes (Signed)
Patient ID: [name], female   DOB: March 14, 1966, 55 y.o.   MRN: [id number] [facility] Progress Note   Assessment/ Plan:   1.  Acute exacerbation of chronic right ventricular heart failure with pulmonary hypertension/shock: Sildenafil was discontinued and she remains on high-dose midodrine for blood pressure support.  Continue volume management with hemodialysis and ultrafiltration.  2. ESRD:  - Continue HD per a Monday/Wednesday/Friday schedule.  (keep systolic blood pressure >44).   - At this time, her deconditioning limits her ability to undertake dialysis in recliner and as such, cannot go back to her outpatient dialysis unit until she has significant improvement of muscular strength - Out of bed to chair as tolerated - encouraged her to try chair this weekend - do not feel comfortable trying HD in recliner until she has successfully tolerated sitting in a chair  - albumin with HD   3. Anemia of CKD: Without overt blood loss; continue aranesp 50mg every Wednesday and follow trends.  4. CKD-MBD: Continue Sensipar for PTH control.  Calcium and phosphorus levels currently at acceptable levels with calcium acetate for phosphorus binder    5. Nutrition: changed to renal diet  6. Chronic hypotension: Blood pressures at goal on TID midodrine; monitor with hemodialysis/ultrafiltration.  EDW 135.5 kg prior to admission and currently about 10 kg over-continue efforts at UF with HD.  Her dry weight may ultimately need to be raised  Disposition - At this time, her deconditioning limits her ability to undertake dialysis in recliner and as such, cannot go back to her outpatient dialysis unit until she has significant improvement of muscular strength/gait.    Subjective:   Seen and examined on dialysis.  Procedure supervised.  Blood pressure 115/70 and HR 61.  LUE AVF in use.  Tolerating goal.  Increased UF goal by 0.5 kg.  She feels legs getting a little more swollen.   Review  of systems:  Reports nausea today and yesterday  She denies shortness of breath   Denies chest pain     Objective:   BP 118/71 (BP Location: Right Arm)   Pulse (!) 59   Temp 98.5 F (36.9 C) (Oral)   Resp 18   Ht _0  (1.651 m)   Wt (!) 142.4 kg   SpO2 100%   BMI 52.24 kg/m   Physical Exam:   General adult female in bed in no acute distress HEENT normocephalic atraumatic extraocular movements intact sclera anicteric Neck supple trachea midline Lungs clear to auscultation bilaterally normal work of breathing at rest on 3 liters Heart S1S2 no rub Abdomen soft nontender nondistended; obese habitus Extremities chronic lymphedema  Psych normal mood and affect Neuro - alert and oriented x 3 provides hx and follows commands Access: LUE AVF in use  Labs: BMET Recent Labs  Lab 07/27/21 0703 07/29/21 0930 07/31/21 0037  NA 133* 133* 132*  K 4.3 4.0 4.6  CL 93* 96* 94*  CO2 _1 GLUCOSE 117* 89 96  BUN 54* 37* 42*  CREATININE 6.64* 5.22* 5.40*  CALCIUM 9.6 8.4* 8.1*  PHOS 3.6 2.9 3.2   CBC Recent Labs  Lab 07/27/21 0703 07/29/21 0040 07/31/21 0037  WBC 9.6 8.8 7.7  HGB 10.4* 9.9* 10.5*  HCT 32.4* 31.2* 33.2*  MCV 97.3 96.6 96.5  PLT 157 193 250      Medications:     amiodarone  200 mg Oral Daily   apixaban  5 mg Oral BID   calcium acetate  667 mg Oral TID WC   Chlorhexidine Gluconate Cloth  6 each Topical Daily   Chlorhexidine Gluconate Cloth  6 each Topical Q0600   cinacalcet  60 mg Oral Q M,W,F-HD   darbepoetin (ARANESP) injection - DIALYSIS  60 mcg Intravenous Q Wed-HD   DULoxetine  20 mg Oral Daily   feeding supplement (NEPRO CARB STEADY)  237 mL Oral TID BM   insulin aspart  0-15 Units Subcutaneous TID WC   insulin aspart  0-5 Units Subcutaneous QHS   mouth rinse  15 mL Mouth Rinse BID   midodrine       midodrine  15 mg Oral TID WC   multivitamin  1 tablet Oral QHS   polyethylene glycol  17 g Oral Daily   senna  1 tablet Oral Daily    Claudia Desanctis, MD 07/31/2021, 1:29 PM

## 2021-07-31 NOTE — Progress Notes (Signed)
Physical Therapy Treatment Patient Details Name: [name] MRN: [id number] DOB: Aug 02, 1966 Today's Date: 07/31/2021    History of Present Illness 55 y.o. female presenting from [facility] ED 7/3 with lethargy, hypotension and elevated lactic acid after d/c from [facility] earlier that day with septic shock and cellulitis. Patient found to have AMS, septic shock, acute respiratory failure, acute on chronic RV failure. Transferred to ICU on 7/12 due to hypotension. PMHX significant for obesity, DMII, COPD, CAD, lymphedema, ESRD on HD MWF and CHF.    PT Comments    Pt received in supine, agreeable to limited instruction on LE/UE therapeutic exercises due to fatigue/pain after hemodialysis (pt premedicated ~15-30 mins prior). Discussed limited transfer training progress/limited progress toward goals with supervising [place], updated disposition recommendation to [facility]. Pt will need goal update next session. Pt continues to benefit from PT services to progress toward functional mobility goals.   Follow Up Recommendations  LTACH;Other (comment) (or SNF if LTACH unavailable)     Equipment Recommendations  Other (comment);Wheelchair cushion (measurements PT);Wheelchair (measurements PT);Hospital bed ((bariatric size for equipment); mechanical lift)    Recommendations for Other Services       Precautions / Restrictions Precautions Precautions: Fall Precaution Comments: bil LE edema, L LE/groin wounds Restrictions Weight Bearing Restrictions: No    Mobility  Bed Mobility    General bed mobility comments: pt defer EOB/OOB mobility due to severe pain/fatigue post-HD session    Transfers        General transfer comment: unable to attempt due to pain              Cognition Arousal/Alertness: Awake/alert Behavior During Therapy: WFL for tasks assessed/performed Overall Cognitive Status: Within Functional Limits for tasks assessed          General Comments: Decreased pain tolerance,  cooperative demeanor.      Exercises General Exercises - Lower Extremity Ankle Circles/Pumps: AROM;Both;10 reps;Supine Quad Sets: AROM;Both;5 reps;Supine Gluteal Sets: AROM;5 reps;Supine Hip ABduction/ADduction: AROM;Right;5 reps;Supine Other Exercises Other Exercises: R hip IR x5 reps (unable to tolerate on LLE due to pain)    General Comments General comments (skin integrity, edema, etc.): VSS on 3L [place]; UE/LE general strengthening supine HEP given      Pertinent Vitals/Pain Pain Assessment: Faces Faces Pain Scale: Hurts whole lot Pain Location: L LE Pain Descriptors / Indicators: Grimacing;Guarding;Sore;Burning;Aching Pain Intervention(s): Limited activity within patient's tolerance;Monitored during session;Premedicated before session;Repositioned     PT Goals (current goals can now be found in the care plan section) Acute Rehab PT Goals Patient Stated Goal: get stronger and return home PT Goal Formulation: With patient Time For Goal Achievement: 08/04/21 Progress towards PT goals: Not progressing toward goals - comment (need to consider LTACH due to limited progress with transfers to chair for outpatient HD at SNF.)    Frequency    Min 2X/week      PT Plan Discharge plan needs to be updated;Other (comment) (need to consider LTACH)       AM-PAC PT "6 Clicks" Mobility   Outcome Measure  Help needed turning from your back to your side while in a flat bed without using bedrails?: A Lot Help needed moving from lying on your back to sitting on the side of a flat bed without using bedrails?: Total Help needed moving to and from a bed to a chair (including a wheelchair)?: Total Help needed standing up from a chair using your arms (e.g., wheelchair or bedside chair)?: Total Help needed to walk in  hospital room?: Total Help needed climbing 3-5 steps with a railing? : Total 6 Click Score: 7    End of Session Equipment Utilized During Treatment: Oxygen Activity  Tolerance: Patient limited by fatigue;Patient limited by pain Patient left: in bed;with call bell/phone within reach Nurse Communication: Mobility status;Other (comment) (pt refusing OOB) PT Visit Diagnosis: Other abnormalities of gait and mobility (R26.89);Muscle weakness (generalized) (M62.81);Pain Pain - Right/Left: Left Pain - part of body: Leg     Time: 1546-1601 PT Time Calculation (min) (ACUTE ONLY): 15 min  Charges:  $Therapeutic Exercise: 8-22 mins                     [name] P., PTA Acute Rehabilitation Services Pager: [phone] Office: [place] 07/31/2021, 5:35 PM

## 2021-08-01 DIAGNOSIS — R5381 Other malaise: Secondary | ICD-10-CM | POA: Diagnosis not present

## 2021-08-01 DIAGNOSIS — Z515 Encounter for palliative care: Secondary | ICD-10-CM | POA: Diagnosis not present

## 2021-08-01 DIAGNOSIS — Z7189 Other specified counseling: Secondary | ICD-10-CM | POA: Diagnosis not present

## 2021-08-01 DIAGNOSIS — N186 End stage renal disease: Secondary | ICD-10-CM | POA: Diagnosis not present

## 2021-08-01 LAB — GLUCOSE, CAPILLARY
Glucose-Capillary: 93 mg/dL (ref 70–99)
Glucose-Capillary: 95 mg/dL (ref 70–99)
Glucose-Capillary: 126 mg/dL — ABNORMAL HIGH (ref 70–99)
Glucose-Capillary: 126 mg/dL — ABNORMAL HIGH (ref 70–99)

## 2021-08-01 MED ORDER — PANTOPRAZOLE SODIUM 40 MG PO TBEC
40.0000 mg | DELAYED_RELEASE_TABLET | Freq: Two times a day (BID) | ORAL | Status: DC
  Administered 2021-08-01 – 2021-08-24 (×44): 40 mg via ORAL
  Filled 2021-08-01 (×43): qty 1

## 2021-08-01 NOTE — Progress Notes (Signed)
Patient ID: [name], female   DOB: [birth year], 55 y.o.   MRN: [id number] [facility] Progress Note   Assessment/ Plan:   1.  Acute exacerbation of chronic right ventricular heart failure with pulmonary hypertension/shock: Sildenafil was discontinued and she remains on high-dose midodrine for blood pressure support.  Continue volume management with hemodialysis and ultrafiltration.  2. ESRD:  - Continue HD per a Monday/Wednesday/Friday schedule.  (keep systolic blood pressure >30)   - At this time, her deconditioning limits her ability to undertake dialysis in recliner and as such, cannot go back to her outpatient dialysis unit until she has significant improvement of muscular strength - Out of bed to chair as tolerated - encouraged her to try chair this weekend - do not feel comfortable trying HD in recliner until she has successfully tolerated sitting in a chair  - albumin with HD  - renal panel in AM   3. Anemia of CKD: Without overt blood loss; continue aranesp 69mg every Wednesday and follow trends.  4. CKD-MBD: Continue Sensipar for PTH control.  Calcium and phosphorus levels currently at acceptable levels with calcium acetate for phosphorus binder    5. Nutrition: changed to renal diet  6. Chronic hypotension: Blood pressures at goal on TID midodrine; monitor with hemodialysis/ultrafiltration.  EDW 135.5 kg prior to admission and currently about 10 kg over-continue efforts at UF with HD.  Her dry weight may ultimately need to be raised.  Post weight on 8/12 was 139.8 kg.  She reached 139 kg on two other charted weights earlier this month  Disposition - At this time, her deconditioning limits her ability to undertake dialysis in recliner and as such, cannot go back to her outpatient dialysis unit until she has significant improvement of muscular strength/gait.    Subjective:   Last HD on 8/12 with 4 kg UF.  She hasn't been out of the bed yet - it's a struggle due  to her leg.   Review of systems:   Reports nausea today; no vomiting today though  She was able to eat a little bit today  She denies shortness of breath   Denies chest pain     Objective:   BP 128/73 (BP Location: Right Arm)   Pulse 66   Temp 97.6 F (36.4 C) (Oral)   Resp 15   Ht _0  (1.651 m)   Wt (!) 139.8 kg   SpO2 96%   BMI 51.29 kg/m   Physical Exam:   General adult female in bed in no acute distress HEENT normocephalic atraumatic extraocular movements intact sclera anicteric Neck supple trachea midline Lungs clear to auscultation bilaterally normal work of breathing at rest on room air  Heart S1S2 no rub Abdomen soft nontender nondistended; obese habitus Extremities chronic lymphedema  Psych normal mood and affect Neuro - alert and oriented x 3 provides hx and follows commands Access: LUE AVF   Labs: BMET Recent Labs  Lab 07/27/21 0703 07/29/21 0930 07/31/21 0037  NA 133* 133* 132*  K 4.3 4.0 4.6  CL 93* 96* 94*  CO2 _1 GLUCOSE 117* 89 96  BUN 54* 37* 42*  CREATININE 6.64* 5.22* 5.40*  CALCIUM 9.6 8.4* 8.1*  PHOS 3.6 2.9 3.2   CBC Recent Labs  Lab 07/27/21 0703 07/29/21 0040 07/31/21 0037  WBC 9.6 8.8 7.7  HGB 10.4* 9.9* 10.5*  HCT 32.4* 31.2* 33.2*  MCV 97.3 96.6 96.5  PLT 157 193 250  Medications:     amiodarone  200 mg Oral Daily   apixaban  5 mg Oral BID   calcium acetate  667 mg Oral TID WC   Chlorhexidine Gluconate Cloth  6 each Topical Daily   Chlorhexidine Gluconate Cloth  6 each Topical Q0600   cinacalcet  60 mg Oral Q M,W,F-HD   darbepoetin (ARANESP) injection - DIALYSIS  60 mcg Intravenous Q Wed-HD   DULoxetine  20 mg Oral Daily   feeding supplement (NEPRO CARB STEADY)  237 mL Oral TID BM   insulin aspart  0-15 Units Subcutaneous TID WC   insulin aspart  0-5 Units Subcutaneous QHS   mouth rinse  15 mL Mouth Rinse BID   midodrine  15 mg Oral TID WC   multivitamin  1 tablet Oral QHS   pantoprazole  40 mg  Oral BID   polyethylene glycol  17 g Oral Daily   senna  1 tablet Oral Daily   Claudia Desanctis, MD 08/01/2021, 2:32 PM

## 2021-08-01 NOTE — Progress Notes (Signed)
PROGRESS NOTE  JERZEE JEROME  PNT:[id number] DOB: November 21, 1966 DOA: 06/21/2021 PCP: Noreene Larsson, NP   Brief Narrative: 55 year old female with a history of end-stage renal disease on hemodialysis, has had a prolonged hospitalization since she was admitted on 06/21/2021 for cellulitis and concerns for sepsis.  She was treated with antibiotics, but had continued issues with hypotension.  Upon further evaluation, it was felt that her hypotension was more related to cardiogenic shock from RV failure and pulmonary hypertension group 3.  She is being followed by cardiology, nephrology and PCCM.  She is intermittently required vasopressors for hypotension.  She has been off vasopressors since 7/20 and has tolerated hemodialysis.  Currently on midodrine.  Transferred to [facility] service on 7/23.  Overnight on 7/26 patient became tachycardic and hypotensive concerns for left lower extremity cellulitis/sepsis started on IV daptomycin and cefepime.  CT of the thigh was ordered, patient was seen by wound care. Infectious disease team consulted, remove midline and start linezolid twice daily. Hemodialysis continues to assist with volume removal.   Assessment & Plan: Principal Problem:   Hypotension Active Problems:   Lymphedema of lower extremity   Morbid obesity (HCC)   Obesity hypoventilation syndrome (HCC)   Cellulitis   ESRD on dialysis ([place])   Staphylococcus epidermidis bacteremia   Elevated troponin   Chronic right heart failure (HCC)   Type 2 diabetes mellitus with hyperglycemia (HCC)   Lymphedema   Cardiogenic shock (HCC)   Bilateral calf pain   Pressure injury of skin   Pulmonary hypertension (HCC)   Cardiogenic shock due to RV failure, resolving Pulmonary hypertension, stable Acute on chronic diastolic congestive heart failure, resolving - Off vasopressors since 7/20, continuing midodrine at high dose. Blood pressure runs borderline low despite ongoing midodrine 40m TID. - Continue HD MWF -  defer to nephrology - Pt will need to be able to transfer to HD chair and sit for full treatment prior to discharge. Making progress with PT/OT slowly, unfortunately this does limit her disposition options as she cannot discharge to SNF or really any outpatient facility until she is able to sit in chair and tolerate hemodialysis. - Needs advanced heart failure clinic follow up (signed off during this hospitalization) post discharge  Left lower extremity 5 cm X 11 cm full-thickness wound in medial and posterior upper thigh: Cellulitis thought to have been ruled out per ID. CT only showed chronic inflammation and lymphedema - Continue BID dressing changes.  - Compression stockings, unna boots for lymphedema - No evidence of infection on exam.  - Continue dilaudid in place of oxycodone given ESRD - continue to wean as tolerated  Infected RUE midline catheter: Has been discontinued.  - Completed linezolid x5 days after midline removal per ID recommendation, completed.   Atypical atrial flutter: NSR s/p DCCV 7/15.  - Continue amiodarone, eliquis.    ESRD: AVF largely occluded, so ligation not required at this time.  - HD as above per nephrology. Needs to be able to sit up in chair to discharge for outpatient HD. Encouraged frequent PT/OT/mobility per RN.   Nausea and vomiting, intractable  -Patient appears to have nausea majority of the mornings, likely post medication administration with poor p.o. intake, we discussed taking medications on an empty stomach was likely not ideal -Initiate twice daily PPI to rule out any underlying gastritis -Symptoms appear to be somewhat worse on dialysis days before dialysis, unclear if related to uremia but labs have been quite stable  Hyperkalemia:  - Managed  by dialysis   OSA: Suspected - Continue CPAP qHS - Outpatient sleep study recommended.    Fibromyalgia - Continue duloxetine.   Anemia of chronic kidney disease: Stable without bleeding.  - ESA  per nephrology.  - With current protracted hospitalization and suspected prolongation from this point, I'm hoping we can decrease frequency of blood draws.  T2DM with hyperglycemia: HbA1c 6.9%.  - Continue SSI, very modest requirements but does get some insulin regularly, so will continue checks and coverage to optimize wound healing.  - Continue carb-limited diet (in addition to heart healthy/renal)   Weakness:  - Continue PT/OT efforts. PT recommends SNF placement. Mobility improving with volume removal and continued therapy. Dispo limited by ambulation and inability to have HD in seated position as above.   Goals of care discussion: Due to multiple medical comorbidities and acute ongoing issues.  Palliative care team was consulted.  Patient wishes to be full code with full scope of treatment, hopeful for improvement and going to rehab.   RN Pressure Injury Documentation: Pressure Injury 07/04/21 Thigh Posterior;Proximal;Right Stage 2 -  Partial thickness loss of dermis presenting as a shallow open injury with a red, pink wound bed without slough. (Active)  07/04/21 1200  Location: Thigh  Location Orientation: Posterior;Proximal;Right  Staging: Stage 2 -  Partial thickness loss of dermis presenting as a shallow open injury with a red, pink wound bed without slough.  Wound Description (Comments):   Present on Admission: No   Obesity: Estimated body mass index is 51.29 kg/m as calculated from the following:   Height as of this encounter: _0  (1.651 m).   Weight as of this encounter: 139.8 kg.  DVT prophylaxis: Eliquis Code Status: Full Family Communication: None at bedside Disposition Plan:  Status is: Inpatient  Remains inpatient appropriate because:Inpatient level of care appropriate due to severity of illness. Needs improvement in volume status with HD which will facilitate mobility which will allow her to transfer and remain in chair for duration of hemodialysis sessions as an  outpatient.  Dispo: The patient is from: Home              Anticipated d/c is to: SNF              Patient currently is medically stable to d/c. Awaiting safe dispo location and ability to tolerate HD seated   Difficult to place patient No  Consultants:  Nephrology ID  Subjective: No acute issues or events overnight, patient continues to have nausea, moderately well controlled with current regimen but still ongoing, discussed poor p.o. intake prior to morning medications as likely only exacerbating any gastritis, PPI started as above  Objective: Vitals:   07/31/21 2320 07/31/21 2324 08/01/21 0317 08/01/21 0620  BP: 108/65  108/66   Pulse: 65 61 60   Resp: _1 Temp: 97.7 F (36.5 C)  97.7 F (36.5 C)   TempSrc: Oral  Oral   SpO2: 99% 100% 100%   Weight:    (!) 139.8 kg  Height:        Intake/Output Summary (Last 24 hours) at 08/01/2021 0743 Last data filed at 08/01/2021 0600 Gross per 24 hour  Intake 720 ml  Output 4000 ml  Net -3280 ml      Filed Weights   07/31/21 1033 07/31/21 1425 08/01/21 0620  Weight: (!) 142.4 kg (!) 138.4 kg (!) 139.8 kg   Gen: 55 y.o. female in no distress Pulm: Nonlabored breathing, clear. CV: Regular rate  and rhythm. No murmur, rub, or gallop. UTD, no definite JVD, stable dependent edema. GI: Abdomen soft, non-tender, non-distended, with normoactive bowel sounds.  Ext: Warm, no deformities. LUA accessed. Skin: No new rashes, lesions or ulcers on visualized skin. Neuro: Alert and oriented. No focal neurological deficits. Psych: Judgement and insight appear fair. Mood euthymic & affect congruent. Behavior is appropriate.    Data Reviewed: I have personally reviewed following labs and imaging studies  CBC: Recent Labs  Lab 07/27/21 0703 07/29/21 0040 07/31/21 0037  WBC 9.6 8.8 7.7  HGB 10.4* 9.9* 10.5*  HCT 32.4* 31.2* 33.2*  MCV 97.3 96.6 96.5  PLT 157 193 604    Basic Metabolic Panel: Recent Labs  Lab 07/27/21 0703  07/29/21 0930 07/31/21 0037  NA 133* 133* 132*  K 4.3 4.0 4.6  CL 93* 96* 94*  CO2 _0 GLUCOSE 117* 89 96  BUN 54* 37* 42*  CREATININE 6.64* 5.22* 5.40*  CALCIUM 9.6 8.4* 8.1*  PHOS 3.6 2.9 3.2    GFR: Estimated Creatinine Clearance: 16.9 mL/min (A) (by C-G formula based on SCr of 5.4 mg/dL (H)). Liver Function Tests: Recent Labs  Lab 07/27/21 0703 07/29/21 0930 07/31/21 0037  ALBUMIN 3.5 3.9 3.8    No results for input(s): LIPASE, AMYLASE in the last 168 hours. No results for input(s): AMMONIA in the last 168 hours. Coagulation Profile: No results for input(s): INR, PROTIME in the last 168 hours.  Cardiac Enzymes: No results for input(s): CKTOTAL, CKMB, CKMBINDEX, TROPONINI in the last 168 hours.  BNP (last 3 results) No results for input(s): PROBNP in the last 8760 hours. HbA1C: No results for input(s): HGBA1C in the last 72 hours. CBG: Recent Labs  Lab 07/31/21 0627 07/31/21 1513 07/31/21 1727 07/31/21 2027 08/01/21 0618  GLUCAP 101* 115* 116* 97 93      Recent Results (from the past 240 hour(s))  Surgical pcr screen     Status: None   Collection Time: 07/27/21 10:47 PM   Specimen: Nasal Mucosa; Nasal Swab  Result Value Ref Range Status   MRSA, PCR NEGATIVE NEGATIVE Final   Staphylococcus aureus NEGATIVE NEGATIVE Final    Comment: (NOTE) The Xpert SA Assay (FDA approved for NASAL specimens in patients 29 years of age and older), is one component of a comprehensive surveillance program. It is not intended to diagnose infection nor to guide or monitor treatment. Performed at [facility], [place] [address] [place], [place] [address]        Radiology Studies: No results found.  Scheduled Meds:  amiodarone  200 mg Oral Daily   apixaban  5 mg Oral BID   calcium acetate  667 mg Oral TID WC   Chlorhexidine Gluconate Cloth  6 each Topical Daily   Chlorhexidine Gluconate Cloth  6 each Topical Q0600   cinacalcet  60 mg Oral Q M,W,F-HD    darbepoetin (ARANESP) injection - DIALYSIS  60 mcg Intravenous Q Wed-HD   DULoxetine  20 mg Oral Daily   feeding supplement (NEPRO CARB STEADY)  237 mL Oral TID BM   insulin aspart  0-15 Units Subcutaneous TID WC   insulin aspart  0-5 Units Subcutaneous QHS   mouth rinse  15 mL Mouth Rinse BID   midodrine  15 mg Oral TID WC   multivitamin  1 tablet Oral QHS   polyethylene glycol  17 g Oral Daily   senna  1 tablet Oral Daily   Continuous Infusions:  sodium chloride  promethazine (PHENERGAN) injection (IM or IVPB) Stopped (07/02/21 1831)     LOS: 41 days   Time spent: 25 minutes.  Little Ishikawa, DO [facility] [email or web address] 08/01/2021, 7:43 AM

## 2021-08-02 DIAGNOSIS — N186 End stage renal disease: Secondary | ICD-10-CM | POA: Diagnosis not present

## 2021-08-02 DIAGNOSIS — R5381 Other malaise: Secondary | ICD-10-CM | POA: Diagnosis not present

## 2021-08-02 DIAGNOSIS — Z7189 Other specified counseling: Secondary | ICD-10-CM | POA: Diagnosis not present

## 2021-08-02 DIAGNOSIS — Z515 Encounter for palliative care: Secondary | ICD-10-CM | POA: Diagnosis not present

## 2021-08-02 LAB — RENAL FUNCTION PANEL
Albumin: 3.8 g/dL (ref 3.5–5.0)
Anion gap: 13 (ref 5–15)
BUN: 41 mg/dL — ABNORMAL HIGH (ref 6–20)
CO2: 26 mmol/L (ref 22–32)
Calcium: 7.9 mg/dL — ABNORMAL LOW (ref 8.9–10.3)
Chloride: 92 mmol/L — ABNORMAL LOW (ref 98–111)
Creatinine, Ser: 5.15 mg/dL — ABNORMAL HIGH (ref 0.44–1.00)
GFR, Estimated: 9 mL/min — ABNORMAL LOW (ref >60)
Glucose, Bld: 147 mg/dL — ABNORMAL HIGH (ref 70–99)
Phosphorus: 2.7 mg/dL (ref 2.5–4.6)
Potassium: 4.5 mmol/L (ref 3.5–5.1)
Sodium: 131 mmol/L — ABNORMAL LOW (ref 135–145)

## 2021-08-02 LAB — GLUCOSE, CAPILLARY
Glucose-Capillary: 129 mg/dL — ABNORMAL HIGH (ref 70–99)
Glucose-Capillary: 121 mg/dL — ABNORMAL HIGH (ref 70–99)
Glucose-Capillary: 109 mg/dL — ABNORMAL HIGH (ref 70–99)
Glucose-Capillary: 108 mg/dL — ABNORMAL HIGH (ref 70–99)

## 2021-08-02 MED ORDER — CHLORHEXIDINE GLUCONATE CLOTH 2 % EX PADS
6.0000 | MEDICATED_PAD | Freq: Every day | CUTANEOUS | Status: DC
  Administered 2021-08-04 – 2021-08-09 (×4): 6 via TOPICAL

## 2021-08-02 NOTE — Progress Notes (Addendum)
Attempt to get out of bed to chair with steady. Patient unable to tolerate movement out of bed patient assisted back to bed call bell within reach at this time. Patient needing pain medication. Pt needing to rest prior to trying again with maxi move lift. Nelda Bucks, Bettina Gavia RN

## 2021-08-02 NOTE — Progress Notes (Addendum)
PROGRESS NOTE  [name]  HUT:[id number] DOB: 04-Sep-1966 DOA: 06/21/2021 PCP: Noreene Larsson, NP   Brief Narrative: 55 year old female with a history of end-stage renal disease on hemodialysis, has had a prolonged hospitalization since she was admitted on 06/21/2021 for cellulitis and concerns for sepsis.  She was treated with antibiotics, but had continued issues with hypotension.  Upon further evaluation, it was felt that her hypotension was more related to cardiogenic shock from RV failure and pulmonary hypertension group 3.  She is being followed by cardiology, nephrology and PCCM.  She is intermittently required vasopressors for hypotension.  She has been off vasopressors since 7/20 and has tolerated hemodialysis.  Currently on midodrine.  Transferred to [facility] service on 7/23.  Overnight on 7/26 patient became tachycardic and hypotensive concerns for left lower extremity cellulitis/sepsis started on IV daptomycin and cefepime.  CT of the thigh was ordered, patient was seen by wound care. Infectious disease team consulted, remove midline and start linezolid twice daily. Hemodialysis continues to assist with volume removal.   Assessment & Plan: Principal Problem:   Hypotension Active Problems:   Lymphedema of lower extremity   Morbid obesity (HCC)   Obesity hypoventilation syndrome (HCC)   Cellulitis   ESRD on dialysis ([place])   Staphylococcus epidermidis bacteremia   Elevated troponin   Chronic right heart failure (HCC)   Type 2 diabetes mellitus with hyperglycemia (HCC)   Lymphedema   Cardiogenic shock (HCC)   Bilateral calf pain   Pressure injury of skin   Pulmonary hypertension (HCC)   Cardiogenic shock due to RV failure, resolving Pulmonary hypertension, stable Acute on chronic diastolic congestive heart failure, resolving - Off vasopressors since 7/20, continuing midodrine at high dose. Blood pressure runs borderline low despite ongoing midodrine 106m TID. - Continue HD MWF -  defer to nephrology - Pt will need to be able to transfer to HD chair and sit for full treatment prior to discharge. Making progress with PT/OT slowly, unfortunately this does limit her disposition options as she cannot discharge to SNF or really any outpatient facility until she is able to sit in chair and tolerate hemodialysis. - Needs advanced heart failure clinic follow up (signed off during this hospitalization) post discharge  Left lower extremity 5 cm X 11 cm full-thickness wound in medial and posterior upper thigh: Cellulitis thought to have been ruled out per ID. CT only showed chronic inflammation and lymphedema - Continue BID dressing changes.  - Compression stockings, unna boots for lymphedema - No evidence of infection on exam.  - Continue dilaudid in place of oxycodone given ESRD - continue to wean as tolerated  Infected RUE midline catheter: Has been discontinued.  - Completed linezolid x5 days after midline removal per ID recommendation, completed.   Atypical atrial flutter: NSR s/p DCCV 7/15.  - Continue amiodarone, eliquis.    ESRD: AVF largely occluded, so ligation not required at this time.  - HD as above per nephrology. Needs to be able to sit up in chair to discharge for outpatient HD. Encouraged frequent PT/OT/mobility per RN.   Nausea and vomiting, intractable  -Patient appears to have nausea majority of the mornings, likely post medication administration with poor p.o. intake, we discussed taking medications on an empty stomach  -Minimally improving with increased p.o. intake this morning but still having difficulty tolerating a substantial meal prior to morning medications -Symptoms appear to be somewhat worse on dialysis days before dialysis, unclear if related to uremia but labs have been  quite stable  Hyperkalemia:  - Managed by dialysis   OSA: Suspected - Continue CPAP qHS - Outpatient sleep study recommended.    Fibromyalgia - Continue duloxetine.    Anemia of chronic kidney disease: Stable without bleeding.  - ESA per nephrology.  - With current protracted hospitalization and suspected prolongation from this point, I'm hoping we can decrease frequency of blood draws.  T2DM with hyperglycemia: HbA1c 6.9%.  - Continue SSI, very modest requirements but does get some insulin regularly, so will continue checks and coverage to optimize wound healing.  - Continue carb-limited diet (in addition to heart healthy/renal)   Weakness:  - Continue PT/OT efforts. PT recommends SNF placement. Mobility improving with volume removal and continued therapy. Dispo limited by ambulation and inability to have HD in seated position as above.   Goals of care discussion: Due to multiple medical comorbidities and acute ongoing issues.  Palliative care team was consulted.  Patient wishes to be full code with full scope of treatment, hopeful for improvement and going to rehab.   RN Pressure Injury Documentation: Pressure Injury 07/04/21 Thigh Posterior;Proximal;Right Stage 2 -  Partial thickness loss of dermis presenting as a shallow open injury with a red, pink wound bed without slough. (Active)  07/04/21 1200  Location: Thigh  Location Orientation: Posterior;Proximal;Right  Staging: Stage 2 -  Partial thickness loss of dermis presenting as a shallow open injury with a red, pink wound bed without slough.  Wound Description (Comments):   Present on Admission: No   Obesity: Estimated body mass index is 51.65 kg/m as calculated from the following:   Height as of this encounter: 5' 5" (1.651 m).   Weight as of this encounter: 140.8 kg.  DVT prophylaxis: Eliquis Code Status: Full Family Communication: None at bedside Disposition Plan:  Status is: Inpatient  Remains inpatient appropriate because:Inpatient level of care appropriate due to severity of illness. Needs improvement in volume status with HD which will facilitate mobility which will allow her to  transfer and remain in chair for duration of hemodialysis sessions as an outpatient.  Dispo: The patient is from: Home              Anticipated d/c is to: SNF              Patient currently is medically stable to d/c. Awaiting safe dispo location and ability to tolerate HD seated   Difficult to place patient No  Consultants:  Nephrology ID  Subjective: No acute issues or events overnight, patient continues to have some nausea, improving today after small p.o. intake with breakfast before morning meds which we discussed was still the major cause for her nausea.  Otherwise denies headache fever chills chest pain shortness of breath  Objective: Vitals:   08/01/21 2000 08/01/21 2310 08/01/21 2312 08/02/21 0411  BP: 124/72  118/64 104/66  Pulse: 72 75 77 61  Resp: _0 Temp: 98.2 F (36.8 C)  98.4 F (36.9 C) 97.7 F (36.5 C)  TempSrc: Oral  Oral Axillary  SpO2: 97% 98% 92%   Weight:    (!) 140.8 kg  Height:       No intake or output data in the 24 hours ending 08/02/21 0743    Filed Weights   07/31/21 1425 08/01/21 0620 08/02/21 0411  Weight: (!) 138.4 kg (!) 139.8 kg (!) 140.8 kg   Gen: 55 y.o. female in no distress Pulm: Nonlabored breathing, clear. CV: Regular rate and rhythm. No murmur,  rub, or gallop. UTD, no definite JVD, stable dependent edema. GI: Abdomen soft, non-tender, non-distended, with normoactive bowel sounds.  Ext: Warm, no deformities. LUA accessed. Skin: No new rashes, lesions or ulcers on visualized skin. Neuro: Alert and oriented. No focal neurological deficits. Psych: Judgement and insight appear fair. Mood euthymic & affect congruent. Behavior is appropriate.    Data Reviewed: I have personally reviewed following labs and imaging studies  CBC: Recent Labs  Lab 07/27/21 0703 07/29/21 0040 07/31/21 0037  WBC 9.6 8.8 7.7  HGB 10.4* 9.9* 10.5*  HCT 32.4* 31.2* 33.2*  MCV 97.3 96.6 96.5  PLT 157 193 984    Basic Metabolic Panel: Recent  Labs  Lab 07/27/21 0703 07/29/21 0930 07/31/21 0037 08/02/21 0010  NA 133* 133* 132* 131*  K 4.3 4.0 4.6 4.5  CL 93* 96* 94* 92*  CO2 _0 GLUCOSE 117* 89 96 147*  BUN 54* 37* 42* 41*  CREATININE 6.64* 5.22* 5.40* 5.15*  CALCIUM 9.6 8.4* 8.1* 7.9*  PHOS 3.6 2.9 3.2 2.7    GFR: Estimated Creatinine Clearance: 17.8 mL/min (A) (by C-G formula based on SCr of 5.15 mg/dL (H)). Liver Function Tests: Recent Labs  Lab 07/27/21 0703 07/29/21 0930 07/31/21 0037 08/02/21 0010  ALBUMIN 3.5 3.9 3.8 3.8    No results for input(s): LIPASE, AMYLASE in the last 168 hours. No results for input(s): AMMONIA in the last 168 hours. Coagulation Profile: No results for input(s): INR, PROTIME in the last 168 hours.  Cardiac Enzymes: No results for input(s): CKTOTAL, CKMB, CKMBINDEX, TROPONINI in the last 168 hours.  BNP (last 3 results) No results for input(s): PROBNP in the last 8760 hours. HbA1C: No results for input(s): HGBA1C in the last 72 hours. CBG: Recent Labs  Lab 08/01/21 0618 08/01/21 1147 08/01/21 1713 08/01/21 2109 08/02/21 0607  GLUCAP 93 95 126* 126* 129*      Recent Results (from the past 240 hour(s))  Surgical pcr screen     Status: None   Collection Time: 07/27/21 10:47 PM   Specimen: Nasal Mucosa; Nasal Swab  Result Value Ref Range Status   MRSA, PCR NEGATIVE NEGATIVE Final   Staphylococcus aureus NEGATIVE NEGATIVE Final    Comment: (NOTE) The Xpert SA Assay (FDA approved for NASAL specimens in patients 34 years of age and older), is one component of a comprehensive surveillance program. It is not intended to diagnose infection nor to guide or monitor treatment. Performed at [facility], [place] [address] [place], [place] [address]        Radiology Studies: No results found.  Scheduled Meds:  amiodarone  200 mg Oral Daily   apixaban  5 mg Oral BID   calcium acetate  667 mg Oral TID WC   Chlorhexidine Gluconate Cloth  6 each Topical  Daily   Chlorhexidine Gluconate Cloth  6 each Topical Q0600   cinacalcet  60 mg Oral Q M,W,F-HD   darbepoetin (ARANESP) injection - DIALYSIS  60 mcg Intravenous Q Wed-HD   DULoxetine  20 mg Oral Daily   feeding supplement (NEPRO CARB STEADY)  237 mL Oral TID BM   insulin aspart  0-15 Units Subcutaneous TID WC   insulin aspart  0-5 Units Subcutaneous QHS   mouth rinse  15 mL Mouth Rinse BID   midodrine  15 mg Oral TID WC   multivitamin  1 tablet Oral QHS   pantoprazole  40 mg Oral BID   polyethylene glycol  17  g Oral Daily   senna  1 tablet Oral Daily   Continuous Infusions:  sodium chloride     promethazine (PHENERGAN) injection (IM or IVPB) Stopped (07/02/21 1831)     LOS: 42 days   Time spent: 25 minutes.  Little Ishikawa, DO [facility] [email or web address] 08/02/2021, 7:43 AM

## 2021-08-02 NOTE — Progress Notes (Signed)
Patient ID: [name], female   DOB: [birth year], 55 y.o.   MRN: [id number] [facility] Progress Note   Assessment/ Plan:   1.  Acute exacerbation of chronic right ventricular heart failure with pulmonary hypertension/shock: Sildenafil was discontinued and she remains on high-dose midodrine for blood pressure support.  Continue volume management with hemodialysis and ultrafiltration.  2. ESRD:  - Continue HD per a Monday/Wednesday/Friday schedule.  (keep systolic blood pressure >60)   - At this time, her deconditioning limits her ability to undertake dialysis in recliner and as such, cannot go back to her outpatient dialysis unit until she has significant improvement of muscular strength - Out of bed to chair as tolerated - encouraged her to try chair and reordered up in chair with assistance as tolerated - do not feel comfortable trying HD in recliner until she has successfully tolerated sitting in a chair  - albumin with HD  - renal panel in AM   3. Anemia of CKD: Without overt blood loss; continue aranesp 27mg every Wednesday and follow trends.  4. CKD-MBD: Continue Sensipar for PTH control.  Calcium and phosphorus levels currently at acceptable levels with calcium acetate for phosphorus binder    5. Nutrition: changed to renal diet  6. Chronic hypotension: Blood pressures at goal on TID midodrine; monitor with hemodialysis/ultrafiltration.  EDW 135.5 kg prior to admission and currently about 10 kg over-continue efforts at UF with HD.  Her dry weight may ultimately need to be raised.  Post weight on 8/12 was 139.8 kg.  She reached 139 kg on two other charted weights earlier this month  Disposition - At this time, her deconditioning limits her ability to undertake dialysis in recliner and as such, cannot go back to her outpatient dialysis unit until she has significant improvement of muscular strength  Subjective:   Last HD on 8/12 with 4 kg UF.  She feels better  today.  Hasn't been able to get up out of bed to a chair yet.    Review of systems:   Reports nausea today but no vomiting; overall nausea is better She is trying for small meals a few times a day She denies shortness of breath   Denies chest pain     Objective:   BP 106/66   Pulse 65   Temp 98 F (36.7 C) (Oral)   Resp 20   Ht _0  (1.651 m)   Wt (!) 140.8 kg Comment: Patient refused standing.  SpO2 92%   BMI 51.65 kg/m   Physical Exam:   General adult female in bed in no acute distress HEENT normocephalic atraumatic extraocular movements intact sclera anicteric Neck supple trachea midline Lungs clear to auscultation bilaterally normal work of breathing at rest on room air  Heart S1S2 no rub Abdomen soft nontender nondistended; obese habitus Extremities chronic lymphedema  Psych normal mood and affect Neuro - alert and oriented x 3 provides hx and follows commands Access: LUE AVF with bruit and thrill   Labs: BMET Recent Labs  Lab 07/27/21 0703 07/29/21 0930 07/31/21 0037 08/02/21 0010  NA 133* 133* 132* 131*  K 4.3 4.0 4.6 4.5  CL 93* 96* 94* 92*  CO2 _1 GLUCOSE 117* 89 96 147*  BUN 54* 37* 42* 41*  CREATININE 6.64* 5.22* 5.40* 5.15*  CALCIUM 9.6 8.4* 8.1* 7.9*  PHOS 3.6 2.9 3.2 2.7   CBC Recent Labs  Lab 07/27/21 0703 07/29/21 0040 07/31/21 0037  WBC 9.6  8.8 7.7  HGB 10.4* 9.9* 10.5*  HCT 32.4* 31.2* 33.2*  MCV 97.3 96.6 96.5  PLT 157 193 250      Medications:     amiodarone  200 mg Oral Daily   apixaban  5 mg Oral BID   calcium acetate  667 mg Oral TID WC   Chlorhexidine Gluconate Cloth  6 each Topical Daily   Chlorhexidine Gluconate Cloth  6 each Topical Q0600   cinacalcet  60 mg Oral Q M,W,F-HD   darbepoetin (ARANESP) injection - DIALYSIS  60 mcg Intravenous Q Wed-HD   DULoxetine  20 mg Oral Daily   feeding supplement (NEPRO CARB STEADY)  237 mL Oral TID BM   insulin aspart  0-15 Units Subcutaneous TID WC   insulin aspart   0-5 Units Subcutaneous QHS   mouth rinse  15 mL Mouth Rinse BID   midodrine  15 mg Oral TID WC   multivitamin  1 tablet Oral QHS   pantoprazole  40 mg Oral BID   polyethylene glycol  17 g Oral Daily   senna  1 tablet Oral Daily   Claudia Desanctis, MD 08/02/2021,1:56 PM

## 2021-08-03 DIAGNOSIS — Z515 Encounter for palliative care: Secondary | ICD-10-CM | POA: Diagnosis not present

## 2021-08-03 DIAGNOSIS — Z7189 Other specified counseling: Secondary | ICD-10-CM | POA: Diagnosis not present

## 2021-08-03 DIAGNOSIS — N186 End stage renal disease: Secondary | ICD-10-CM | POA: Diagnosis not present

## 2021-08-03 DIAGNOSIS — R5381 Other malaise: Secondary | ICD-10-CM | POA: Diagnosis not present

## 2021-08-03 LAB — GLUCOSE, CAPILLARY
Glucose-Capillary: 96 mg/dL (ref 70–99)
Glucose-Capillary: 95 mg/dL (ref 70–99)
Glucose-Capillary: 118 mg/dL — ABNORMAL HIGH (ref 70–99)
Glucose-Capillary: 104 mg/dL — ABNORMAL HIGH (ref 70–99)

## 2021-08-03 MED ORDER — HYDROMORPHONE HCL 2 MG PO TABS
1.0000 mg | ORAL_TABLET | ORAL | Status: DC | PRN
  Administered 2021-08-03 – 2021-08-20 (×52): 1 mg via ORAL
  Filled 2021-08-03 (×52): qty 1

## 2021-08-03 NOTE — Progress Notes (Signed)
Mobility Specialist: Progress Note   08/03/21 1535  Mobility  Activity Stood at bedside  Level of Assistance +2 (takes two people)  Counselling psychologist Sit up in bed/chair position for meals  Mobility Response Tolerated fair  Mobility performed by Mobility specialist;Nurse  $Mobility charge 1 Mobility   Pt feeling nauseous upon entering room and retching, agreeable to mobility session after. Pt mod independent with bed mobility. Attempted to assist pt in standing with the Clarise Cruz Plus without the platform. Pt able to stand but unable to maintain standing for length of time to attempt to take steps to the chair. Pt had to sit back down and be assisted back to bed d/t LLE pain. RN present in the room to help assist pt back to bed.   [facility] Costas Sena Mobility Specialist Mobility Specialist Phone: [phone]

## 2021-08-03 NOTE — Progress Notes (Signed)
Patient left thigh dressing changed as ordered. Patient with left thigh wound, with green/yellow drainage.at wound bed. Xeroform and abd pad placed as ordered. Patient tolerated well. Eithen Castiglia, Bettina Gavia RN

## 2021-08-03 NOTE — Care Management Important Message (Signed)
Important Message  Patient Details  Name: [name] MRN: [id number] Date of Birth: 06-Feb-1966   Medicare Important Message Given:  Yes     Shelda Altes 08/03/2021, 9:29 AM

## 2021-08-03 NOTE — Progress Notes (Signed)
This chaplain is present with the Pt., notary, and two witnesses for the notarizing of the Pt. Advance Directive:  HCPOA and LW.  The Pt. has chosen to name Resa Rinks as her healthcare agent.  If the healthcare agent is unwilling or unable to serve, the Pt. has chosen [employer] to serve in this role.  The chaplain gave the Pt. the original Advance Directive and two copies to the Pt. The chaplain scanned a copy of the Pt. AD to her EMR.  This chaplain is available for F/U spiritual care as needed.

## 2021-08-03 NOTE — Progress Notes (Signed)
Patient ID: [name], female   DOB: [birth year], 55 y.o.   MRN: [id number] [facility] Progress Note   Assessment/ Plan:   1.  Acute exacerbation of chronic right ventricular heart failure with pulmonary hypertension/shock: Remains on high-dose midodrine for chronic hypotension with as needed albumin at dialysis.  Continue volume management with hemodialysis and ultrafiltration. 2. ESRD: On a Monday/Wednesday/Friday schedule and she is on schedule for hemodialysis again today(keep systolic blood pressure >10 which he appears to have tolerated asymptomatically).  At this time, her deconditioning limits her ability to undertake dialysis in recliner and as such, cannot go back to her outpatient dialysis unit until she has significant improvement of muscular strength/gait.  Unfortunately has had limited success in tolerating to sit down in recliner. 3. Anemia: Without overt blood loss, continue ESA with surveillance of hemoglobin/hematocrit. 4. CKD-MBD: Continue Sensipar for PTH control.  Calcium and phosphorus levels currently at acceptable levels with ongoing calcium acetate for phosphorus binding. 5. Nutrition: Continue renal diet with oral nutritional supplementation 6. Hypertension: Blood pressures currently at goal, monitor with hemodialysis/ultrafiltration.  She will need a revision of her estimated dry weight prior to discharge.  Subjective:   Reports some nausea and vomiting this morning-did not want breakfast.  Had some pain in her left thigh/wound area with attempting to sit on recliner limiting time on chair.   Objective:   BP 118/67 (BP Location: Right Arm)   Pulse 64   Temp 97.7 F (36.5 C) (Oral)   Resp 16   Ht _0  (1.651 m)   Wt (!) 139.8 kg   SpO2 94%   BMI 51.29 kg/m   Physical Exam: Gen: Appears comfortable resting in bed. CVS: Pulse regular rhythm, normal rate, S1 and S2 normal Resp: Clear to auscultation bilaterally, no rales/rhonchi Abd: Soft,  obese, nontender Ext: Chronic lymphedema with venous stasis changes bilaterally.  Left inner thigh wound noted.  Left RCF with intact dressings.  Labs: BMET Recent Labs  Lab 07/29/21 0930 07/31/21 0037 08/02/21 0010  NA 133* 132* 131*  K 4.0 4.6 4.5  CL 96* 94* 92*  CO2 _1 GLUCOSE 89 96 147*  BUN 37* 42* 41*  CREATININE 5.22* 5.40* 5.15*  CALCIUM 8.4* 8.1* 7.9*  PHOS 2.9 3.2 2.7   CBC Recent Labs  Lab 07/29/21 0040 07/31/21 0037  WBC 8.8 7.7  HGB 9.9* 10.5*  HCT 31.2* 33.2*  MCV 96.6 96.5  PLT 193 250      Medications:     amiodarone  200 mg Oral Daily   apixaban  5 mg Oral BID   calcium acetate  667 mg Oral TID WC   Chlorhexidine Gluconate Cloth  6 each Topical Daily   Chlorhexidine Gluconate Cloth  6 each Topical Q0600   Chlorhexidine Gluconate Cloth  6 each Topical Q0600   cinacalcet  60 mg Oral Q M,W,F-HD   darbepoetin (ARANESP) injection - DIALYSIS  60 mcg Intravenous Q Wed-HD   DULoxetine  20 mg Oral Daily   feeding supplement (NEPRO CARB STEADY)  237 mL Oral TID BM   insulin aspart  0-15 Units Subcutaneous TID WC   insulin aspart  0-5 Units Subcutaneous QHS   mouth rinse  15 mL Mouth Rinse BID   midodrine  15 mg Oral TID WC   multivitamin  1 tablet Oral QHS   pantoprazole  40 mg Oral BID   polyethylene glycol  17 g Oral Daily   senna  1 tablet  Oral Daily   Elmarie Shiley, MD 08/03/2021, 9:01 AM

## 2021-08-03 NOTE — Progress Notes (Signed)
This chaplain is present for F/U spiritual care.  The Pt. is awake and shared she is experiencing a little nausea this morning.   The Pt. accepted the chaplain's shared birthday wishes. The chaplain confirmed the Pt. interest and will facilitate the notarizing the Pt. Advance Directive at 1pm today.    The Pt. accepted the chaplain's invitation for prayer.

## 2021-08-03 NOTE — Progress Notes (Signed)
PROGRESS NOTE  [name]  URK:[id number] DOB: 1966-09-05 DOA: 06/21/2021 PCP: Noreene Larsson, NP   Brief Narrative: 55 year old female with a history of end-stage renal disease on hemodialysis, has had a prolonged hospitalization since she was admitted on 06/21/2021 for cellulitis and concerns for sepsis.  She was treated with antibiotics, but had continued issues with hypotension.  Upon further evaluation, it was felt that her hypotension was more related to cardiogenic shock from RV failure and pulmonary hypertension group 3.  She is being followed by cardiology, nephrology and PCCM.  She is intermittently required vasopressors for hypotension.  She has been off vasopressors since 7/20 and has tolerated hemodialysis.  Currently on midodrine.  Transferred to [facility] service on 7/23.  Overnight on 7/26 patient became tachycardic and hypotensive concerns for left lower extremity cellulitis/sepsis started on IV daptomycin and cefepime.  CT of the thigh was ordered, patient was seen by wound care. Infectious disease team consulted, remove midline and start linezolid twice daily. Hemodialysis continues to assist with volume removal.   Patient currently medically stable for discharge, unfortunately continues to have inability to ambulate any meaningful distance or sit up to chair which is a prerequisite for outpatient dialysis.  Once patient is able to ambulate appropriately and tolerate sitting upright in a chair/recliner for 4 hours to simulate dialysis she would be ready for discharge.  Until that time continue aggressive PT, volume management and continue to encourage patient to increase ambulation and strength.  Assessment & Plan: Principal Problem:   Hypotension Active Problems:   Lymphedema of lower extremity   Morbid obesity (HCC)   Obesity hypoventilation syndrome (HCC)   Cellulitis   ESRD on dialysis ([place])   Staphylococcus epidermidis bacteremia   Elevated troponin   Chronic right heart  failure (HCC)   Type 2 diabetes mellitus with hyperglycemia (HCC)   Lymphedema   Cardiogenic shock (HCC)   Bilateral calf pain   Pressure injury of skin   Pulmonary hypertension (HCC)   Cardiogenic shock due to RV failure, resolving Pulmonary hypertension, stable Acute on chronic diastolic congestive heart failure, resolving - Off vasopressors since 7/20, continuing midodrine at high dose. Blood pressure runs borderline low despite ongoing midodrine 71m TID. - Continue HD MWF - defer to nephrology - Pt will need to be able to transfer to HD chair and sit for full treatment prior to discharge. Making progress with PT/OT slowly, unfortunately this does limit her disposition options as she cannot discharge to SNF or really any outpatient facility until she is able to sit in chair and tolerate hemodialysis. - Needs advanced heart failure clinic follow up (signed off during this hospitalization) post discharge  Left lower extremity 5 cm X 11 cm full-thickness wound in medial and posterior upper thigh: Cellulitis thought to have been ruled out per ID. CT only showed chronic inflammation and lymphedema - Continue BID dressing changes.  - Compression stockings, unna boots for lymphedema - No evidence of infection on exam.  - Continue dilaudid in place of oxycodone given ESRD - continue to wean as tolerated  Infected RUE midline catheter: Has been discontinued.  - Completed linezolid x5 days after midline removal per ID recommendation, completed.   Atypical atrial flutter: NSR s/p DCCV 7/15.  - Continue amiodarone, eliquis.    ESRD: AVF largely occluded, so ligation not required at this time.  - HD as above per nephrology. Needs to be able to sit up in chair to discharge for outpatient HD. Encouraged frequent PT/OT/mobility  per RN.   Nausea and vomiting, intractable  -Patient appears to have nausea majority of the mornings, likely post medication administration with poor p.o. intake, we  discussed taking medications on an empty stomach  -Minimally improving with increased p.o. intake this morning but still having difficulty tolerating a substantial meal prior to morning medications -Symptoms appear to be somewhat worse on dialysis days before dialysis, unclear if related to uremia but labs have been quite stable  Hyperkalemia:  - Managed by dialysis   OSA: Suspected - Continue CPAP qHS - Outpatient sleep study recommended.    Fibromyalgia - Continue duloxetine.   Anemia of chronic kidney disease: Stable without bleeding.  - ESA per nephrology.  - With current protracted hospitalization and suspected prolongation from this point, I'm hoping we can decrease frequency of blood draws.  T2DM with hyperglycemia: HbA1c 6.9%.  - Continue SSI, very modest requirements but does get some insulin regularly, so will continue checks and coverage to optimize wound healing.  - Continue carb-limited diet (in addition to heart healthy/renal)   Weakness:  - Continue PT/OT efforts. PT recommends SNF placement. Mobility improving with volume removal and continued therapy. Dispo limited by ambulation and inability to have HD in seated position as above.   Goals of care discussion: Due to multiple medical comorbidities and acute ongoing issues.  Palliative care team was consulted.  Patient wishes to be full code with full scope of treatment, hopeful for improvement and going to rehab.   RN Pressure Injury Documentation: Pressure Injury 07/04/21 Thigh Posterior;Proximal;Right Stage 2 -  Partial thickness loss of dermis presenting as a shallow open injury with a red, pink wound bed without slough. (Active)  07/04/21 1200  Location: Thigh  Location Orientation: Posterior;Proximal;Right  Staging: Stage 2 -  Partial thickness loss of dermis presenting as a shallow open injury with a red, pink wound bed without slough.  Wound Description (Comments):   Present on Admission: No   Obesity:  Estimated body mass index is 51.29 kg/m as calculated from the following:   Height as of this encounter: _0  (1.651 m).   Weight as of this encounter: 139.8 kg.  DVT prophylaxis: Eliquis Code Status: Full Family Communication: None at bedside Disposition Plan:  Status is: Inpatient  Remains inpatient appropriate because:Inpatient level of care appropriate due to severity of illness. Needs improvement in volume status with HD which will facilitate mobility which will allow her to transfer and remain in chair for duration of hemodialysis sessions as an outpatient.  Dispo: The patient is from: Home              Anticipated d/c is to: SNF              Patient currently is medically stable to d/c. Awaiting safe dispo location and ability to tolerate HD seated   Difficult to place patient No  Consultants:  Nephrology ID  Subjective: No acute issues or events overnight, nausea and vomiting well controlled this morning - denies headache, fevers, chills, diarrhea or constipation. Ambulation still limited by pain.  Objective: Vitals:   08/02/21 2308 08/03/21 0027 08/03/21 0322 08/03/21 0600  BP: 116/66  109/68   Pulse: 75 69 70   Resp: _1 Temp: 97.7 F (36.5 C)  97.7 F (36.5 C)   TempSrc: Oral  Oral   SpO2: 95% 100% 97%   Weight:    (!) 139.8 kg  Height:        Intake/Output Summary (  Last 24 hours) at 08/03/2021 0738 Last data filed at 08/02/2021 1800 Gross per 24 hour  Intake 457 ml  Output --  Net 457 ml      Filed Weights   08/01/21 0620 08/02/21 0411 08/03/21 0600  Weight: (!) 139.8 kg (!) 140.8 kg (!) 139.8 kg   Gen: 55 y.o. female in no distress Pulm: Nonlabored breathing, clear. CV: Regular rate and rhythm. No murmur, rub, or gallop. UTD, no definite JVD, stable dependent edema. GI: Abdomen soft, non-tender, non-distended, with normoactive bowel sounds.  Ext: Warm, no deformities. LUA accessed. Skin: No new rashes, lesions or ulcers on visualized  skin. Neuro: Alert and oriented. No focal neurological deficits. Psych: Judgement and insight appear fair. Mood euthymic & affect congruent. Behavior is appropriate.    Data Reviewed: I have personally reviewed following labs and imaging studies  CBC: Recent Labs  Lab 07/29/21 0040 07/31/21 0037  WBC 8.8 7.7  HGB 9.9* 10.5*  HCT 31.2* 33.2*  MCV 96.6 96.5  PLT 193 992    Basic Metabolic Panel: Recent Labs  Lab 07/29/21 0930 07/31/21 0037 08/02/21 0010  NA 133* 132* 131*  K 4.0 4.6 4.5  CL 96* 94* 92*  CO2 _0 GLUCOSE 89 96 147*  BUN 37* 42* 41*  CREATININE 5.22* 5.40* 5.15*  CALCIUM 8.4* 8.1* 7.9*  PHOS 2.9 3.2 2.7    GFR: Estimated Creatinine Clearance: 17.6 mL/min (A) (by C-G formula based on SCr of 5.15 mg/dL (H)). Liver Function Tests: Recent Labs  Lab 07/29/21 0930 07/31/21 0037 08/02/21 0010  ALBUMIN 3.9 3.8 3.8    No results for input(s): LIPASE, AMYLASE in the last 168 hours. No results for input(s): AMMONIA in the last 168 hours. Coagulation Profile: No results for input(s): INR, PROTIME in the last 168 hours.  Cardiac Enzymes: No results for input(s): CKTOTAL, CKMB, CKMBINDEX, TROPONINI in the last 168 hours.  BNP (last 3 results) No results for input(s): PROBNP in the last 8760 hours. HbA1C: No results for input(s): HGBA1C in the last 72 hours. CBG: Recent Labs  Lab 08/02/21 1110 08/02/21 1650 08/02/21 2017 08/03/21 0611 08/03/21 0727  GLUCAP 121* 109* 108* 96 95      Recent Results (from the past 240 hour(s))  Surgical pcr screen     Status: None   Collection Time: 07/27/21 10:47 PM   Specimen: Nasal Mucosa; Nasal Swab  Result Value Ref Range Status   MRSA, PCR NEGATIVE NEGATIVE Final   Staphylococcus aureus NEGATIVE NEGATIVE Final    Comment: (NOTE) The Xpert SA Assay (FDA approved for NASAL specimens in patients 53 years of age and older), is one component of a comprehensive surveillance program. It is not intended  to diagnose infection nor to guide or monitor treatment. Performed at [facility], [place] [address] [place], [place] [address]        Radiology Studies: No results found.  Scheduled Meds:  amiodarone  200 mg Oral Daily   apixaban  5 mg Oral BID   calcium acetate  667 mg Oral TID WC   Chlorhexidine Gluconate Cloth  6 each Topical Daily   Chlorhexidine Gluconate Cloth  6 each Topical Q0600   Chlorhexidine Gluconate Cloth  6 each Topical Q0600   cinacalcet  60 mg Oral Q M,W,F-HD   darbepoetin (ARANESP) injection - DIALYSIS  60 mcg Intravenous Q Wed-HD   DULoxetine  20 mg Oral Daily   feeding supplement (NEPRO CARB STEADY)  237 mL Oral  TID BM   insulin aspart  0-15 Units Subcutaneous TID WC   insulin aspart  0-5 Units Subcutaneous QHS   mouth rinse  15 mL Mouth Rinse BID   midodrine  15 mg Oral TID WC   multivitamin  1 tablet Oral QHS   pantoprazole  40 mg Oral BID   polyethylene glycol  17 g Oral Daily   senna  1 tablet Oral Daily   Continuous Infusions:  sodium chloride     promethazine (PHENERGAN) injection (IM or IVPB) Stopped (07/02/21 1831)     LOS: 43 days   Time spent: 25 minutes.  Little Ishikawa, DO [facility] [email or web address] 08/03/2021, 7:38 AM

## 2021-08-03 NOTE — Progress Notes (Signed)
Placed patient on CPAP via FFM, previous settings (auto-titrate). Tolerating well at this time.

## 2021-08-04 DIAGNOSIS — Z515 Encounter for palliative care: Secondary | ICD-10-CM | POA: Diagnosis not present

## 2021-08-04 DIAGNOSIS — N186 End stage renal disease: Secondary | ICD-10-CM | POA: Diagnosis not present

## 2021-08-04 DIAGNOSIS — R5381 Other malaise: Secondary | ICD-10-CM | POA: Diagnosis not present

## 2021-08-04 DIAGNOSIS — Z7189 Other specified counseling: Secondary | ICD-10-CM | POA: Diagnosis not present

## 2021-08-04 LAB — CBC
HCT: 34.2 % — ABNORMAL LOW (ref 36.0–46.0)
Hemoglobin: 11 g/dL — ABNORMAL LOW (ref 12.0–15.0)
MCH: 30.1 pg (ref 26.0–34.0)
MCHC: 32.2 g/dL (ref 30.0–36.0)
MCV: 93.7 fL (ref 80.0–100.0)
Platelets: 265 10*3/uL (ref 150–400)
RBC: 3.65 MIL/uL — ABNORMAL LOW (ref 3.87–5.11)
RDW: 18.2 % — ABNORMAL HIGH (ref 11.5–15.5)
WBC: 8.8 10*3/uL (ref 4.0–10.5)
nRBC: 0 % (ref 0.0–0.2)

## 2021-08-04 LAB — GLUCOSE, CAPILLARY
Glucose-Capillary: 101 mg/dL — ABNORMAL HIGH (ref 70–99)
Glucose-Capillary: 112 mg/dL — ABNORMAL HIGH (ref 70–99)
Glucose-Capillary: 118 mg/dL — ABNORMAL HIGH (ref 70–99)
Glucose-Capillary: 102 mg/dL — ABNORMAL HIGH (ref 70–99)

## 2021-08-04 MED ORDER — COLLAGENASE 250 UNIT/GM EX OINT
TOPICAL_OINTMENT | Freq: Every day | CUTANEOUS | Status: DC
  Filled 2021-08-04 (×3): qty 30

## 2021-08-04 NOTE — Progress Notes (Signed)
Patient placed on CPAP for night time rest.

## 2021-08-04 NOTE — Progress Notes (Signed)
PROGRESS NOTE  [name]  FYB:[id number] DOB: Jul 20, 1966 DOA: 06/21/2021 PCP: Noreene Larsson, NP   Brief Narrative: 55 year old female with a history of end-stage renal disease on hemodialysis, has had a prolonged hospitalization since she was admitted on 06/21/2021 for cellulitis and concerns for sepsis.  She was treated with antibiotics, but had continued issues with hypotension.  Upon further evaluation, it was felt that her hypotension was more related to cardiogenic shock from RV failure and pulmonary hypertension group 3.  She is being followed by cardiology, nephrology and PCCM.  She is intermittently required vasopressors for hypotension.  She has been off vasopressors since 7/20 and has tolerated hemodialysis.  Currently on midodrine.  Transferred to [facility] service on 7/23.  Overnight on 7/26 patient became tachycardic and hypotensive concerns for left lower extremity cellulitis/sepsis started on IV daptomycin and cefepime.  CT of the thigh was ordered, patient was seen by wound care. Infectious disease team consulted, remove midline and start linezolid twice daily. Hemodialysis continues to assist with volume removal.   Patient currently medically stable for discharge, unfortunately continues to have inability to ambulate any meaningful distance or sit up to chair which is a prerequisite for outpatient dialysis.  Once patient is able to ambulate appropriately and tolerate sitting upright in a chair/recliner for 4 hours to simulate dialysis she would be ready for discharge.  Until that time continue aggressive PT, volume management and continue to encourage patient to increase ambulation and strength.  Assessment & Plan: Principal Problem:   Hypotension Active Problems:   Lymphedema of lower extremity   Morbid obesity (HCC)   Obesity hypoventilation syndrome (HCC)   Cellulitis   ESRD on dialysis ([place])   Staphylococcus epidermidis bacteremia   Elevated troponin   Chronic right heart  failure (HCC)   Type 2 diabetes mellitus with hyperglycemia (HCC)   Lymphedema   Cardiogenic shock (HCC)   Bilateral calf pain   Pressure injury of skin   Pulmonary hypertension (HCC)   Cardiogenic shock due to RV failure, resolving Pulmonary hypertension, stable Acute on chronic diastolic congestive heart failure, resolving - Off vasopressors since 7/20, continuing midodrine at high dose. Blood pressure runs borderline low despite ongoing midodrine 48m TID. - Continue HD MWF - defer to nephrology - Pt will need to be able to transfer to HD chair and sit for full treatment prior to discharge. Making progress with PT/OT slowly, unfortunately this does limit her disposition options as she cannot discharge to SNF or really any outpatient facility until she is able to sit in chair and tolerate hemodialysis. - Needs advanced heart failure clinic follow up (signed off during this hospitalization) post discharge  Left lower extremity 5 cm X 11 cm full-thickness wound in medial and posterior upper thigh Worsening inflammation/pain/purulence 08/04/21, concern for acute infectious process Cellulitis previously only showed chronic inflammation and lymphedema without acute infection - Continue BID dressing changes.  - Compression stockings, unna boots for lymphedema - No evidence of infection on exam.  - Continue dilaudid in place of oxycodone given ESRD - continue to wean as tolerated - Wound care consulted 08/04/21 - if necessary will repeat imaging to re-evaluate if not improving with superficial wound care  Infected RUE midline catheter: Has been discontinued.  - Completed linezolid x5 days after midline removal per ID recommendation, completed.   Atypical atrial flutter: NSR s/p DCCV 7/15.  - Continue amiodarone, eliquis.    ESRD: AVF largely occluded, so ligation not required at this time.  -  HD as above per nephrology. Needs to be able to sit up in chair to discharge for outpatient HD.  Encouraged frequent PT/OT/mobility per RN.   Nausea and vomiting, intractable  -Patient appears to have nausea majority of the mornings, likely post medication administration with poor p.o. intake, we discussed taking medications on an empty stomach  -Minimally improving with increased p.o. intake this morning but still having difficulty tolerating a substantial meal prior to morning medications -Symptoms appear to be somewhat worse on dialysis days before dialysis, unclear if related to uremia but labs have been quite stable  Hyperkalemia:  - Managed by dialysis   OSA: Suspected - Continue CPAP qHS - Outpatient sleep study recommended.    Fibromyalgia - Continue duloxetine.   Anemia of chronic kidney disease: Stable without bleeding.  - ESA per nephrology.  - With current protracted hospitalization and suspected prolongation from this point, I'm hoping we can decrease frequency of blood draws.  T2DM with hyperglycemia: HbA1c 6.9%.  - Continue SSI, very modest requirements but does get some insulin regularly, so will continue checks and coverage to optimize wound healing.  - Continue carb-limited diet (in addition to heart healthy/renal)   Weakness:  - Continue PT/OT efforts. PT recommends SNF placement. Mobility improving with volume removal and continued therapy. Dispo limited by ambulation and inability to have HD in seated position as above.   Goals of care discussion: Due to multiple medical comorbidities and acute ongoing issues.  Palliative care team was consulted.  Patient wishes to be full code with full scope of treatment, hopeful for improvement and going to rehab.   RN Pressure Injury Documentation: Pressure Injury 07/04/21 Thigh Posterior;Proximal;Right Stage 2 -  Partial thickness loss of dermis presenting as a shallow open injury with a red, pink wound bed without slough. (Active)  07/04/21 1200  Location: Thigh  Location Orientation: Posterior;Proximal;Right   Staging: Stage 2 -  Partial thickness loss of dermis presenting as a shallow open injury with a red, pink wound bed without slough.  Wound Description (Comments):   Present on Admission: No   Obesity: Estimated body mass index is 51.29 kg/m as calculated from the following:   Height as of this encounter: 5' 5" (1.651 m).   Weight as of this encounter: 139.8 kg.  DVT prophylaxis: Eliquis Code Status: Full Family Communication: None at bedside Disposition Plan:  Status is: Inpatient  Remains inpatient appropriate because:Inpatient level of care appropriate due to severity of illness. Needs improvement in volume status with HD which will facilitate mobility which will allow her to transfer and remain in chair for duration of hemodialysis sessions as an outpatient.  Dispo: The patient is from: Home              Anticipated d/c is to: SNF              Patient currently is medically stable to d/c. Awaiting safe dispo location and ability to tolerate HD seated   Difficult to place patient No  Consultants:  Nephrology ID  Subjective: Worsening pain/smell from LLE wound - concern for acute superficial infection. Denies fevers/chills.  Objective: Vitals:   08/04/21 0000 08/04/21 0450 08/04/21 0756 08/04/21 1140  BP: 120/77 102/65 (!) 109/58 128/74  Pulse: 66 98 68 72  Resp: _0 Temp: 98.7 F (37.1 C) 97.7 F (36.5 C) 97.8 F (36.6 C) 97.9 F (36.6 C)  TempSrc: Oral Oral Oral Oral  SpO2: 92% 93% 94% 95%  Weight:  Height:        Intake/Output Summary (Last 24 hours) at 08/04/2021 1528 Last data filed at 08/03/2021 2309 Gross per 24 hour  Intake --  Output 5000 ml  Net -5000 ml      Filed Weights   08/01/21 0620 08/02/21 0411 08/03/21 0600  Weight: (!) 139.8 kg (!) 140.8 kg (!) 139.8 kg   Gen: 55 y.o. female in no distress Pulm: Nonlabored breathing, clear. CV: Regular rate and rhythm. No murmur, rub, or gallop. UTD, no definite JVD, stable dependent  edema. GI: Abdomen soft, non-tender, non-distended, with normoactive bowel sounds.  Ext: Warm, no deformities. Skin: See left medial thigh wound - purulent edges with foul odor; BLE distal wraps clean/dry/intact. Neuro: Alert and oriented. No focal neurological deficits. Psych: Judgement and insight appear fair. Mood euthymic & affect congruent. Behavior is appropriate.      Data Reviewed: I have personally reviewed following labs and imaging studies  CBC: Recent Labs  Lab 07/29/21 0040 07/31/21 0037 08/04/21 0043  WBC 8.8 7.7 8.8  HGB 9.9* 10.5* 11.0*  HCT 31.2* 33.2* 34.2*  MCV 96.6 96.5 93.7  PLT 193 250 217    Basic Metabolic Panel: Recent Labs  Lab 07/29/21 0930 07/31/21 0037 08/02/21 0010  NA 133* 132* 131*  K 4.0 4.6 4.5  CL 96* 94* 92*  CO2 _0 GLUCOSE 89 96 147*  BUN 37* 42* 41*  CREATININE 5.22* 5.40* 5.15*  CALCIUM 8.4* 8.1* 7.9*  PHOS 2.9 3.2 2.7    GFR: Estimated Creatinine Clearance: 17.6 mL/min (A) (by C-G formula based on SCr of 5.15 mg/dL (H)). Liver Function Tests: Recent Labs  Lab 07/29/21 0930 07/31/21 0037 08/02/21 0010  ALBUMIN 3.9 3.8 3.8    No results for input(s): LIPASE, AMYLASE in the last 168 hours. No results for input(s): AMMONIA in the last 168 hours. Coagulation Profile: No results for input(s): INR, PROTIME in the last 168 hours.  Cardiac Enzymes: No results for input(s): CKTOTAL, CKMB, CKMBINDEX, TROPONINI in the last 168 hours.  BNP (last 3 results) No results for input(s): PROBNP in the last 8760 hours. HbA1C: No results for input(s): HGBA1C in the last 72 hours. CBG: Recent Labs  Lab 08/03/21 0727 08/03/21 1124 08/03/21 1549 08/04/21 0636 08/04/21 1141  GLUCAP 95 118* 104* 101* 112*      Recent Results (from the past 240 hour(s))  Surgical pcr screen     Status: None   Collection Time: 07/27/21 10:47 PM   Specimen: Nasal Mucosa; Nasal Swab  Result Value Ref Range Status   MRSA, PCR NEGATIVE  NEGATIVE Final   Staphylococcus aureus NEGATIVE NEGATIVE Final    Comment: (NOTE) The Xpert SA Assay (FDA approved for NASAL specimens in patients 46 years of age and older), is one component of a comprehensive surveillance program. It is not intended to diagnose infection nor to guide or monitor treatment. Performed at [facility], [place] [address], [place], [place] [address]        Radiology Studies: No results found.  Scheduled Meds:  amiodarone  200 mg Oral Daily   apixaban  5 mg Oral BID   calcium acetate  667 mg Oral TID WC   Chlorhexidine Gluconate Cloth  6 each Topical Daily   Chlorhexidine Gluconate Cloth  6 each Topical Q0600   Chlorhexidine Gluconate Cloth  6 each Topical Q0600   cinacalcet  60 mg Oral Q M,W,F-HD   collagenase   Topical Daily  darbepoetin (ARANESP) injection - DIALYSIS  60 mcg Intravenous Q Wed-HD   DULoxetine  20 mg Oral Daily   feeding supplement (NEPRO CARB STEADY)  237 mL Oral TID BM   insulin aspart  0-15 Units Subcutaneous TID WC   insulin aspart  0-5 Units Subcutaneous QHS   mouth rinse  15 mL Mouth Rinse BID   midodrine  15 mg Oral TID WC   multivitamin  1 tablet Oral QHS   pantoprazole  40 mg Oral BID   polyethylene glycol  17 g Oral Daily   senna  1 tablet Oral Daily   Continuous Infusions:  sodium chloride     promethazine (PHENERGAN) injection (IM or IVPB) Stopped (07/02/21 1831)     LOS: 44 days   Time spent: 25 minutes.  Little Ishikawa, DO [facility]  Pager: secure chat  08/04/2021, 3:28 PM

## 2021-08-04 NOTE — Progress Notes (Signed)
PT Cancellation Note  Patient Details Name: [name] MRN: [id number] DOB: 1966-02-16   Cancelled Treatment:    Reason Eval/Treat Not Completed: Pain limiting ability to participate. PT attempted to see, pt required premedication.  Pt still refusing over lack of time on meds after returning.  Follow up as time and pt allow.   Ramond Dial 08/04/2021, 3:36 PM  Mee Hives, PT MS Acute Rehab Dept. Number: [place]

## 2021-08-04 NOTE — Progress Notes (Signed)
RT note. Patient unable to wear cpap at this time due to vomit, RT will continue to monitor

## 2021-08-04 NOTE — Progress Notes (Signed)
Mobility Specialist: Progress Note   08/04/21 1838  Mobility  Activity Transferred:  Bed to chair  Level of Assistance +2 (takes two people)  Assistive Device MaxiMove  Mobility Out of bed to chair with meals  Mobility Response Tolerated well  Mobility performed by Mobility specialist  Bed Position Chair  $Mobility charge 1 Mobility   Pt assisted to the chair with MaxiMove, +2 for safety. Pt asx during transfer. Pt has call bell and phone in her lap.   [facility] Jvion Turgeon Mobility Specialist Mobility Specialist Phone: [phone]

## 2021-08-04 NOTE — Consult Note (Addendum)
[facility] Nurse Consult Note: Patient receiving care in [place] Previous consult completed by my partner L. McNichol on 07/15/21. The orders previously placed have expired and the wound has evolved into an unstageable  Reason for Consult: Left thigh wound Wound type: Unstageable wound that has evolved in the infectious process Pressure Injury POA: Yes/No/NA Measurement: See flowsheet. RN to add. Wound bed: See photo from today Drainage (amount, consistency, odor)  Dressing procedure/placement/frequency: Cleanse the left thigh area with soap and water, rinse and pat dry. Apply a nickel thick layer of Santyl over the entire wound, cover with a moistened saline gauze, dry gauze and ABD pad secured with Medipore tape. Change daily.     Monitor the wound area(s) for worsening of condition such as: Signs/symptoms of infection, increase in size, development of or worsening of odor, development of pain, or increased pain at the affected locations.   Notify the medical team if any of these develop.  Thank you for the consult. [place] nurse will not follow at this time.   Please re-consult the [place] team if needed.  Cathlean Marseilles Tamala Julian, MSN, RN, [place], Lysle Pearl, [facility] Wound Treatment Associate Pager [phone]

## 2021-08-04 NOTE — Progress Notes (Signed)
Patient ID: [name], female   DOB: 16-Mar-1966, 55 y.o.   MRN: [id number] [facility] Progress Note   Assessment/ Plan:   1.  Acute exacerbation of chronic right ventricular heart failure with pulmonary hypertension/shock: Remains on high-dose midodrine for chronic hypotension with as needed albumin at dialysis.  She continues to tolerate hemodialysis with ultrafiltration based on current blood pressure parameters. 2. ESRD: On a Monday/Wednesday/Friday schedule and underwent hemodialysis yesterday with an impressive 5 L ultrafiltration.  She has had problems with sitting in a recliner because of her left thigh wound and this unfortunately bodes poorly for outpatient dialysis unit placement where she will need to sit for at least 5 hours.  Continue to work with physical therapy to help improve in this and if unsuccessful-we will likely need to explore LTAC. 3. Anemia: Without overt blood loss, continue ESA with surveillance of hemoglobin/hematocrit. 4. CKD-MBD: Continue Sensipar for PTH control.  Calcium and phosphorus levels currently at acceptable levels with ongoing calcium acetate for phosphorus binding. 5. Nutrition: Continue renal diet with oral nutritional supplementation 6. Hypertension: Blood pressures currently at goal, monitor with hemodialysis/ultrafiltration.  She will need a revision of her estimated dry weight prior to discharge.  Subjective:   Tolerated hemodialysis well yesterday without cramping or chest pain.   Objective:   BP (!) 109/58 (BP Location: Right Arm)   Pulse 68   Temp 97.8 F (36.6 C) (Oral)   Resp 19   Ht 5' 5" (1.651 m)   Wt (!) 139.8 kg   SpO2 94%   BMI 51.29 kg/m   Physical Exam: Gen: Sleeping comfortably in bed, easy to awaken and engage in conversation. CVS: Pulse regular rhythm, normal rate, S1 and S2 normal Resp: Clear to auscultation bilaterally, no rales/rhonchi Abd: Soft, obese, nontender Ext: Chronic lymphedema with venous  stasis changes bilaterally.  Left inner thigh wound noted.  Left RCF with intact dressings.  Labs: BMET Recent Labs  Lab 07/29/21 0930 07/31/21 0037 08/02/21 0010  NA 133* 132* 131*  K 4.0 4.6 4.5  CL 96* 94* 92*  CO2 _0 GLUCOSE 89 96 147*  BUN 37* 42* 41*  CREATININE 5.22* 5.40* 5.15*  CALCIUM 8.4* 8.1* 7.9*  PHOS 2.9 3.2 2.7   CBC Recent Labs  Lab 07/29/21 0040 07/31/21 0037 08/04/21 0043  WBC 8.8 7.7 8.8  HGB 9.9* 10.5* 11.0*  HCT 31.2* 33.2* 34.2*  MCV 96.6 96.5 93.7  PLT 193 250 265      Medications:     amiodarone  200 mg Oral Daily   apixaban  5 mg Oral BID   calcium acetate  667 mg Oral TID WC   Chlorhexidine Gluconate Cloth  6 each Topical Daily   Chlorhexidine Gluconate Cloth  6 each Topical Q0600   Chlorhexidine Gluconate Cloth  6 each Topical Q0600   cinacalcet  60 mg Oral Q M,W,F-HD   darbepoetin (ARANESP) injection - DIALYSIS  60 mcg Intravenous Q Wed-HD   DULoxetine  20 mg Oral Daily   feeding supplement (NEPRO CARB STEADY)  237 mL Oral TID BM   insulin aspart  0-15 Units Subcutaneous TID WC   insulin aspart  0-5 Units Subcutaneous QHS   mouth rinse  15 mL Mouth Rinse BID   midodrine  15 mg Oral TID WC   multivitamin  1 tablet Oral QHS   pantoprazole  40 mg Oral BID   polyethylene glycol  17 g Oral Daily   senna  1 tablet Oral Daily   Elmarie Shiley, MD 08/04/2021, 8:55 AM

## 2021-08-04 NOTE — Progress Notes (Signed)
Occupational Therapy Treatment Patient Details Name: [name] MRN: [id number] DOB: 1966-08-08 Today's Date: 08/04/2021    History of present illness 55 y.o. female presenting from [place] ED 7/3 with lethargy, hypotension and elevated lactic acid after d/c from [facility] earlier that day with septic shock and cellulitis. Patient found to have AMS, septic shock, acute respiratory failure, acute on chronic RV failure. Transferred to ICU on 7/12 due to hypotension. PMHX significant for obesity, DMII, COPD, CAD, lymphedema, ESRD on HD MWF and CHF.   OT comments  Pt progressing gradually towards OT goals, continued to be limited by L LE pain despite premedication. Pt able to demo sit to stand transfers with Mod A x 2 and successfully weight shift in standing. Pt able to lift B heels off of ground but unable to lift feet to take pivotal steps at this time. Reinforced use of chair position feature in bed to improve upright sitting tolerance for OP HD. Pt reports unable to tolerate this position longer than a few minutes. DC recs remain appropriate.    Follow Up Recommendations  SNF;LTACH;Supervision/Assistance - 24 hour    Equipment Recommendations  Hospital bed    Recommendations for Other Services      Precautions / Restrictions Precautions Precautions: Fall Precaution Comments: bil LE edema, L thigh wounds Restrictions Weight Bearing Restrictions: No       Mobility Bed Mobility Overal bed mobility: Needs Assistance Bed Mobility: Supine to Sit;Sit to Supine;Rolling Rolling: Mod assist;+2 for safety/equipment   Supine to sit: Modified independent (Device/Increase time);HOB elevated Sit to supine: Mod assist;+2 for physical assistance   General bed mobility comments: Pt able to sit EOB with use of bedrails, Mod A x2 to get B LE back into bed. Mod A x 1-2 for rolling and placement of new bed pad    Transfers Overall transfer level: Needs assistance   Transfers: Sit to/from Stand Sit  to Stand: Mod assist;+2 physical assistance         General transfer comment: Mod A x 2 for sit to stand (2 attempts) to pull self up with locked back of bariatric chair (handles). Guided pt in weight shifting tasks though unable to march in place yet. Standing tolerance limited < 20 sec    Balance Overall balance assessment: Needs assistance Sitting-balance support: No upper extremity supported;Feet unsupported Sitting balance-Leahy Scale: Fair     Standing balance support: Bilateral upper extremity supported Standing balance-Leahy Scale: Poor Standing balance comment: reliant on UE support                           ADL either performed or assessed with clinical judgement   ADL Overall ADL's : Needs assistance/impaired                     Lower Body Dressing: Total assistance;Bed level Lower Body Dressing Details (indicate cue type and reason): Total A to don socks bed level     Toileting- Clothing Manipulation and Hygiene: Total assistance;Bed level Toileting - Clothing Manipulation Details (indicate cue type and reason): Total A for cleanup due to smear noted on bed pad       General ADL Comments: Session focused on progressing standing abilities, weight shifting in prep for ADL transfers. Pt able to lift B heels off of ground w/ weight shifting but unable to lift feet fully off of ground yet for pivot transfers     Vision   Vision Assessment?:  No apparent visual deficits   Perception     Praxis      Cognition Arousal/Alertness: Awake/alert Behavior During Therapy: WFL for tasks assessed/performed Overall Cognitive Status: Within Functional Limits for tasks assessed                                          Exercises     Shoulder Instructions       General Comments Discussed use of chair position throughout day to improve tolerance to upright posture in prep for OP HD. Pt reports 8 minutes is the longest she has spent in  chair position is 8 min. Pt also reports MD says wound is infected with debridement being planned    Pertinent Vitals/ Pain       Pain Assessment: Faces Faces Pain Scale: Hurts even more Pain Location: L LE Pain Descriptors / Indicators: Grimacing;Guarding;Sore;Burning;Aching Pain Intervention(s): Monitored during session;Limited activity within patient's tolerance  Home Living                                          Prior Functioning/Environment              Frequency  Min 2X/week        Progress Toward Goals  OT Goals(current goals can now be found in the care plan section)  Progress towards OT goals: Progressing toward goals  Acute Rehab OT Goals Patient Stated Goal: get stronger and return home OT Goal Formulation: With patient Time For Goal Achievement: 08/06/21 Potential to Achieve Goals: Fair ADL Goals Pt Will Perform Grooming: sitting;with set-up Pt Will Perform Upper Body Dressing: with set-up;sitting Pt Will Transfer to Toilet: with max assist;with +2 assist;stand pivot transfer;bedside commode Pt Will Perform Toileting - Clothing Manipulation and hygiene: with mod assist;sit to/from stand;sitting/lateral leans;with adaptive equipment Pt/caregiver will Perform Home Exercise Program: Increased strength;Both right and left upper extremity;With theraband;Independently;With written HEP provided Additional ADL Goal #1: Pt will tolerate 10 min sitting EOB dynamically with supervision as precursor to ADLs  Plan Frequency remains appropriate;Discharge plan needs to be updated    Co-evaluation                 AM-PAC OT "6 Clicks" Daily Activity     Outcome Measure   Help from another person eating meals?: None Help from another person taking care of personal grooming?: A Little Help from another person toileting, which includes using toliet, bedpan, or urinal?: Total Help from another person bathing (including washing, rinsing, drying)?:  A Lot Help from another person to put on and taking off regular upper body clothing?: A Little Help from another person to put on and taking off regular lower body clothing?: Total 6 Click Score: 14    End of Session    OT Visit Diagnosis: Other abnormalities of gait and mobility (R26.89);Muscle weakness (generalized) (M62.81);Pain Pain - Right/Left: Left Pain - part of body: Leg   Activity Tolerance Patient tolerated treatment well;Patient limited by pain   Patient Left in bed;with call bell/phone within reach   Nurse Communication Mobility status        Time: 7829-5621 OT Time Calculation (min): 27 min  Charges: OT General Charges $OT Visit: 1 Visit OT Treatments $Therapeutic Activity: 23-37 mins  Malachy Chamber, OTR/L Acute Rehab Services Office:  [place] 08/04/2021, 12:37 PM

## 2021-08-05 DIAGNOSIS — L03115 Cellulitis of right lower limb: Secondary | ICD-10-CM | POA: Diagnosis not present

## 2021-08-05 DIAGNOSIS — I50812 Chronic right heart failure: Secondary | ICD-10-CM | POA: Diagnosis not present

## 2021-08-05 DIAGNOSIS — I9589 Other hypotension: Secondary | ICD-10-CM | POA: Diagnosis not present

## 2021-08-05 DIAGNOSIS — R570 Cardiogenic shock: Secondary | ICD-10-CM | POA: Diagnosis not present

## 2021-08-05 LAB — RENAL FUNCTION PANEL
Albumin: 3.7 g/dL (ref 3.5–5.0)
Anion gap: 13 (ref 5–15)
BUN: 38 mg/dL — ABNORMAL HIGH (ref 6–20)
CO2: 23 mmol/L (ref 22–32)
Calcium: 7.8 mg/dL — ABNORMAL LOW (ref 8.9–10.3)
Chloride: 95 mmol/L — ABNORMAL LOW (ref 98–111)
Creatinine, Ser: 5.78 mg/dL — ABNORMAL HIGH (ref 0.44–1.00)
GFR, Estimated: 8 mL/min — ABNORMAL LOW (ref >60)
Glucose, Bld: 138 mg/dL — ABNORMAL HIGH (ref 70–99)
Phosphorus: 2.6 mg/dL (ref 2.5–4.6)
Potassium: 4 mmol/L (ref 3.5–5.1)
Sodium: 131 mmol/L — ABNORMAL LOW (ref 135–145)

## 2021-08-05 LAB — GLUCOSE, CAPILLARY
Glucose-Capillary: 127 mg/dL — ABNORMAL HIGH (ref 70–99)
Glucose-Capillary: 154 mg/dL — ABNORMAL HIGH (ref 70–99)
Glucose-Capillary: 133 mg/dL — ABNORMAL HIGH (ref 70–99)
Glucose-Capillary: 180 mg/dL — ABNORMAL HIGH (ref 70–99)

## 2021-08-05 MED ORDER — ALBUMIN HUMAN 25 % IV SOLN
INTRAVENOUS | Status: AC
  Administered 2021-08-05: 25 g
  Filled 2021-08-05: qty 100

## 2021-08-05 MED ORDER — MIDODRINE HCL 5 MG PO TABS
ORAL_TABLET | ORAL | Status: AC
  Administered 2021-08-05: 15 mg via ORAL
  Filled 2021-08-05: qty 15

## 2021-08-05 MED ORDER — DARBEPOETIN ALFA 25 MCG/0.42ML IJ SOSY
PREFILLED_SYRINGE | INTRAMUSCULAR | Status: AC
  Administered 2021-08-05: 25 ug via INTRAVENOUS
  Filled 2021-08-05: qty 0.42

## 2021-08-05 MED ORDER — DARBEPOETIN ALFA 25 MCG/0.42ML IJ SOSY
25.0000 ug | PREFILLED_SYRINGE | INTRAMUSCULAR | Status: DC
  Administered 2021-08-12: 25 ug via INTRAVENOUS
  Filled 2021-08-05 (×2): qty 0.42

## 2021-08-05 NOTE — Progress Notes (Signed)
Patient ID: [name], female   DOB: [birth year], 55 y.o.   MRN: [id number] [facility] Progress Note   Assessment/ Plan:   1.  Acute exacerbation of chronic right ventricular heart failure with pulmonary hypertension/shock: On high-dose midodrine for chronic hypotension with as needed albumin at dialysis.  She continues to tolerate hemodialysis with ultrafiltration based on current blood pressure parameters (SBP greater than 85). 2. ESRD: On a Monday/Wednesday/Friday schedule and on schedule for hemodialysis again today.  Yesterday with significant impressive ability to sleep in recliner for 4-1/2 hours and will attempt to work to increase duration of ability to sit.  If she is able to do so over the next 2 days, we will undertake hemodialysis in a recliner on Friday 8/19. 3. Anemia: Without overt blood loss, continue ESA with surveillance of hemoglobin/hematocrit. 4. CKD-MBD: Continue Sensipar for PTH control.  Calcium and phosphorus levels currently at acceptable levels with ongoing calcium acetate for phosphorus binding. 5. Nutrition: Continue renal diet with oral nutritional supplementation 6. Hypertension: Blood pressures currently at goal, monitor with hemodialysis/ultrafiltration.  We will revise dry weight at the time of discharge.  Subjective:   Having some nausea this morning along with pain of her left thigh/wound.  Was able to sit in recliner for 4-1/2 hours yesterday.   Objective:   BP 121/71 (BP Location: Right Arm)   Pulse 66   Temp 97.7 F (36.5 C) (Oral)   Resp 18   Ht 5' 5" (1.651 m)   Wt (!) 136.7 kg   SpO2 92%   BMI 50.15 kg/m   Physical Exam: Gen: Appears comfortable resting in bed, talking to nurse via phone. CVS: Pulse regular rhythm, normal rate, S1 and S2 normal Resp: Clear to auscultation bilaterally, no rales/rhonchi Abd: Soft, obese, nontender Ext: Chronic lymphedema with venous stasis changes bilaterally.  Left inner thigh wound noted.   Left RCF with intact dressings.  Labs: BMET Recent Labs  Lab 07/29/21 0930 07/31/21 0037 08/02/21 0010 08/05/21 0141  NA 133* 132* 131* 131*  K 4.0 4.6 4.5 4.0  CL 96* 94* 92* 95*  CO2 _0 GLUCOSE 89 96 147* 138*  BUN 37* 42* 41* 38*  CREATININE 5.22* 5.40* 5.15* 5.78*  CALCIUM 8.4* 8.1* 7.9* 7.8*  PHOS 2.9 3.2 2.7 2.6   CBC Recent Labs  Lab 07/31/21 0037 08/04/21 0043  WBC 7.7 8.8  HGB 10.5* 11.0*  HCT 33.2* 34.2*  MCV 96.5 93.7  PLT 250 265      Medications:     amiodarone  200 mg Oral Daily   apixaban  5 mg Oral BID   calcium acetate  667 mg Oral TID WC   Chlorhexidine Gluconate Cloth  6 each Topical Daily   Chlorhexidine Gluconate Cloth  6 each Topical Q0600   Chlorhexidine Gluconate Cloth  6 each Topical Q0600   cinacalcet  60 mg Oral Q M,W,F-HD   collagenase   Topical Daily   darbepoetin (ARANESP) injection - DIALYSIS  25 mcg Intravenous Q Wed-HD   DULoxetine  20 mg Oral Daily   feeding supplement (NEPRO CARB STEADY)  237 mL Oral TID BM   insulin aspart  0-15 Units Subcutaneous TID WC   insulin aspart  0-5 Units Subcutaneous QHS   mouth rinse  15 mL Mouth Rinse BID   midodrine  15 mg Oral TID WC   multivitamin  1 tablet Oral QHS   pantoprazole  40 mg Oral BID   polyethylene  glycol  17 g Oral Daily   senna  1 tablet Oral Daily   Elmarie Shiley, MD 08/05/2021, 8:40 AM

## 2021-08-05 NOTE — Progress Notes (Signed)
PT Cancellation Note  Patient Details Name: [name] MRN: [id number] DOB: Sep 24, 1966   Cancelled Treatment:    Reason Eval/Treat Not Completed: Patient at procedure or test/unavailable. Patient off unit currently, possibly for HD. Will re-attempt when patient available.    [name] 08/05/2021, 1:59 PM

## 2021-08-05 NOTE — Progress Notes (Signed)
Orthopedic Tech Progress Note Patient Details:  [name] 1966-07-10 [id number]  Ortho Devices Type of Ortho Device: Louretta Parma boot Ortho Device/Splint Location: Bi LE Ortho Device/Splint Interventions: Application   Post Interventions Patient Tolerated: Well Instructions Provided: Care of device, Poper ambulation with device  [name] [name] 08/05/2021, 7:14 PM

## 2021-08-05 NOTE — Progress Notes (Signed)
Pt placed on CPAP for night rest.

## 2021-08-05 NOTE — Accreditation Note (Signed)
Pt informs RN of her chinese food delivered to her at the main entrance ready to to be picked up. Pt re-educated on the her renal diet and why she has to avoid such food. Pt stated it's not the first time she ordered [place] food. Pt stated "I will not order such food again but I spent $30 of the food". Pt food picked up by secretary and delivered to patient in her room. Will continue to closely monitor pt. Delia Heady RN

## 2021-08-05 NOTE — Progress Notes (Signed)
Late entry in documentation, see flow sheet. Natane Heward, Bettina Gavia rN

## 2021-08-05 NOTE — Progress Notes (Signed)
Nutrition Follow Up  DOCUMENTATION CODES:   Not applicable  INTERVENTION:   Liberalize diet to provide patient with more option given varying intake.   Continue Nepro Shake po TID, each supplement provides 425 kcal and 19 grams protein Continue renal MVI daily   NUTRITION DIAGNOSIS:   Increased nutrient needs related to wound healing as evidenced by estimated needs.  Ongoing  GOAL:   Patient will meet greater than or equal to 90% of their needs  Intake varies   MONITOR:   PO intake, Supplement acceptance, Weight trends, I & O's, Labs  REASON FOR ASSESSMENT:   Rounds    ASSESSMENT:   Patient with PMH significant for thyroid cancer, CHF, Chiari malformation, ESRD/HD, COPD, CAD, HLD, HTN, DM, and lymphedema. Presents this admission with cardiogenic shock from RV failure.  7/15- s/p DC cardioversion   Appetite remains variable given fluctuating nausea. States she skipped breakfast this am but was able to eat ~40% of her lunch. Meal intakes documented from 0-35% over the last couple of days. Continues to take Nepro 2 times daily.   Tolerated sitting in the chair at bedside for four hours yesterday. Plan to attempt HD in a recliner during next session.   EDW: 135 kg  Current weight: 136.7 kg   Medications: phoslo, sensipar, aranesp, SS novolog, miralax, senokot Labs: Na 131 (L) CBG 101-154  Diet Order:   Diet Order             Diet renal/carb modified with fluid restriction Diet-HS Snack? Nothing; Fluid restriction: 1200 mL Fluid; Room service appropriate? Yes; Fluid consistency: Thin  Diet effective now                   EDUCATION NEEDS:   Education needs have been addressed  Skin:  Skin Assessment: Skin Integrity Issues: Skin Integrity Issues:: Stage II Stage II: R thigh  Last BM:  8/14  Height:   Ht Readings from Last 1 Encounters:  07/16/21 _0  (1.651 m)    Weight:   Wt Readings from Last 1 Encounters:  08/05/21 (!) 136.7 kg    BMI:   Body mass index is 50.15 kg/m.  Estimated Nutritional Needs:   Kcal:  1800-2000 kcal  Protein:  90-105 grams  Fluid:  >/= 1.8 L/day  Mariana Single MS, RD, LDN, CNSC Clinical Nutrition Pager listed in [place]

## 2021-08-05 NOTE — Progress Notes (Signed)
PROGRESS NOTE    [name]  KPT:[id number] DOB: [birth year] DOA: 06/21/2021 PCP: Noreene Larsson, NP   Brief Narrative: [name] is a 55 y.o. female with a history of ESRD on HD, diet-controlled diabetes mellitus type 2, hypertension, heart failure, COPD, chronic Edema of left leg.  Patient presented secondary to right hip and leg pain with evidence of cellulitis.  She was started on empiric antibiotics but continued to have issues with hypotension with evidence of cardiogenic shock from biventricular failure and pulm hypertension.  She is managed with vasopressors in the ICU and nephrology was managing fluid via hemodialysis.  Blood pressure is better controlled and stable on midodrine. Discharge complicated by patient deconditioned state and requirement of tolerating HD in a chair.   Assessment & Plan:   Principal Problem:   Hypotension Active Problems:   Lymphedema of lower extremity   Morbid obesity (HCC)   Obesity hypoventilation syndrome (HCC)   Cellulitis   ESRD on dialysis ([place])   Staphylococcus epidermidis bacteremia   Elevated troponin   Chronic right heart failure (HCC)   Type 2 diabetes mellitus with hyperglycemia (HCC)   Lymphedema   Cardiogenic shock (HCC)   Bilateral calf pain   Pressure injury of skin   Pulmonary hypertension (HCC)   Cardiogenic shock Secondary to RV failure. Patient required Vasopressor support managed by heart failure team. Resolved. Midodrine started for blood pressure support.   Acute on chronic diastolic heart failure RV failure Managed with HD. EF of 55-60%.  Left medial thigh wound Prior CT without clear evidence of infection. Wound measures  5 x 11 cm. Concern for possible acute infectious process on 8/16 but wound is not highly suggestive of infection except for worsening smell of wound. No fevers, increased pain or leukocytosis. Wound care re-consulted with recommendations. -Repeat imaging if concern for development of  secondary infection  Infected RUE midline catheter Midline removed and patient treated with linezolid for 5 days.  Atypical atrial flutter Patient underwent DCCV on 7/15 with reversion to normal sinus rhythm. -Continue amiodarone, Eliquis  ESRD Nephrology consulted. AVF largely occluded. Patient undergoing HD. Plan for outpatient HD if she can tolerate upright HD in a chair.  Intractable nausea/vomiting Resolved.  Hyperkalemia Management with HD  Possible OSA/OHS Pulmonary hypertension Started on CPAP during hospitalization -Continue CPAP with recommendation for outpatient sleep study  Fibromyalgia -Continue Cymbalta  Anemia of chronic disease Hemoglobin stable.  Diabetes mellitus, type 2 Hemoglobin A1c of 6.9%. Not on medication management as an outpatient.  Started on insulin sliding scale inpatient. -Continue SSI  Morbid obesity Body mass index is 49.34 kg/m.  Weakness PT/OT recommending SNF.    Pressure injury Right/posterior/proximal thigh. Not present on admission.   DVT prophylaxis: Eliquis Code Status:   Code Status: Full Code Family Communication: None at bedside Disposition Plan: Discharge to SNF once able to tolerate outpatient HD. Patient will need to tolerate HD while seated.   Consultants:  Nephrology Infectious disease  Procedures:  HEMODIALYSIS  Antimicrobials: Linezolid Daptomycin Ceftriaxone Cefepime    Subjective: No issues overnight. Tolerated sitting in a chair for 4 hours yesterday but required a hoyer lift for transfer.  Objective: Vitals:   08/05/21 0047 08/05/21 0500 08/05/21 0633 08/05/21 0746  BP: 128/85  125/73 121/71  Pulse: 67  66 66  Resp: _0 Temp: 98.6 F (37 C)  97.8 F (36.6 C) 97.7 F (36.5 C)  TempSrc: Oral  Oral Oral  SpO2: 97%  95% 92%  Weight:  (!) 136.7 kg    Height:        Intake/Output Summary (Last 24 hours) at 08/05/2021 0912 Last data filed at 08/05/2021 0700 Gross per 24 hour   Intake 480 ml  Output --  Net 480 ml   Filed Weights   08/02/21 0411 08/03/21 0600 08/05/21 0500  Weight: (!) 140.8 kg (!) 139.8 kg (!) 136.7 kg    Examination:  General exam: Appears calm and comfortable Respiratory system: Clear on anterior auscultation. Respiratory effort normal. Cardiovascular system: S1 & S2 heard, RRR. No murmurs, rubs, gallops or clicks. Gastrointestinal system: Abdomen is nondistended, soft and nontender. No organomegaly or masses felt. Normal bowel sounds heard. Central nervous system: Alert and oriented. No focal neurological deficits. Musculoskeletal: No calf tenderness. Left thigh wound with clean dressing intact. Unna boot intact. Bilateral edema. Skin: No cyanosis. No rashes Psychiatry: Judgement and insight appear normal. Mood & affect appropriate.     Data Reviewed: I have personally reviewed following labs and imaging studies  CBC Lab Results  Component Value Date   WBC 8.8 08/04/2021   RBC 3.65 (L) 08/04/2021   HGB 11.0 (L) 08/04/2021   HCT 34.2 (L) 08/04/2021   MCV 93.7 08/04/2021   MCH 30.1 08/04/2021   PLT 265 08/04/2021   MCHC 32.2 08/04/2021   RDW 18.2 (H) 08/04/2021   LYMPHSABS 1.3 06/22/2021   MONOABS 1.1 (H) 06/22/2021   EOSABS 0.0 06/22/2021   BASOSABS 0.0 47/42/5956     Last metabolic panel Lab Results  Component Value Date   NA 131 (L) 08/05/2021   K 4.0 08/05/2021   CL 95 (L) 08/05/2021   CO2 23 08/05/2021   BUN 38 (H) 08/05/2021   CREATININE 5.78 (H) 08/05/2021   GLUCOSE 138 (H) 08/05/2021   GFRNONAA 8 (L) 08/05/2021   GFRAA 5 (L) 07/04/2020   GFRAA 5 (L) 07/04/2020   CALCIUM 7.8 (L) 08/05/2021   PHOS 2.6 08/05/2021   PROT 8.0 07/19/2021   ALBUMIN 3.7 08/05/2021   BILITOT 1.0 07/19/2021   ALKPHOS 287 (H) 07/19/2021   AST 20 07/19/2021   ALT 15 07/19/2021   ANIONGAP 13 08/05/2021    CBG (last 3)  Recent Labs    08/04/21 1627 08/04/21 2117 08/05/21 0633  GLUCAP 118* 102* 127*      GFR: Estimated Creatinine Clearance: 15.4 mL/min (A) (by C-G formula based on SCr of 5.78 mg/dL (H)).  Coagulation Profile: No results for input(s): INR, PROTIME in the last 168 hours.  Recent Results (from the past 240 hour(s))  Surgical pcr screen     Status: None   Collection Time: 07/27/21 10:47 PM   Specimen: Nasal Mucosa; Nasal Swab  Result Value Ref Range Status   MRSA, PCR NEGATIVE NEGATIVE Final   Staphylococcus aureus NEGATIVE NEGATIVE Final    Comment: (NOTE) The Xpert SA Assay (FDA approved for NASAL specimens in patients 74 years of age and older), is one component of a comprehensive surveillance program. It is not intended to diagnose infection nor to guide or monitor treatment. Performed at [facility], [place] [address], [place], [place] [address]         Radiology Studies: No results found.      Scheduled Meds:  amiodarone  200 mg Oral Daily   apixaban  5 mg Oral BID   calcium acetate  667 mg Oral TID WC   Chlorhexidine Gluconate Cloth  6 each Topical Daily   Chlorhexidine Gluconate  Cloth  6 each Topical Q0600   Chlorhexidine Gluconate Cloth  6 each Topical Q0600   cinacalcet  60 mg Oral Q M,W,F-HD   collagenase   Topical Daily   darbepoetin (ARANESP) injection - DIALYSIS  25 mcg Intravenous Q Wed-HD   DULoxetine  20 mg Oral Daily   feeding supplement (NEPRO CARB STEADY)  237 mL Oral TID BM   insulin aspart  0-15 Units Subcutaneous TID WC   insulin aspart  0-5 Units Subcutaneous QHS   mouth rinse  15 mL Mouth Rinse BID   midodrine  15 mg Oral TID WC   multivitamin  1 tablet Oral QHS   pantoprazole  40 mg Oral BID   polyethylene glycol  17 g Oral Daily   senna  1 tablet Oral Daily   Continuous Infusions:  sodium chloride     promethazine (PHENERGAN) injection (IM or IVPB) Stopped (07/02/21 1831)     LOS: 45 days     Cordelia Poche, MD [facility] 08/05/2021, 9:12 AM  If 7PM-7AM, please contact  night-coverage [email or web address]

## 2021-08-06 DIAGNOSIS — R570 Cardiogenic shock: Secondary | ICD-10-CM | POA: Diagnosis not present

## 2021-08-06 DIAGNOSIS — L03115 Cellulitis of right lower limb: Secondary | ICD-10-CM | POA: Diagnosis not present

## 2021-08-06 DIAGNOSIS — I9589 Other hypotension: Secondary | ICD-10-CM | POA: Diagnosis not present

## 2021-08-06 DIAGNOSIS — I50812 Chronic right heart failure: Secondary | ICD-10-CM | POA: Diagnosis not present

## 2021-08-06 LAB — CBC
HCT: 34.6 % — ABNORMAL LOW (ref 36.0–46.0)
Hemoglobin: 11.3 g/dL — ABNORMAL LOW (ref 12.0–15.0)
MCH: 30.5 pg (ref 26.0–34.0)
MCHC: 32.7 g/dL (ref 30.0–36.0)
MCV: 93.5 fL (ref 80.0–100.0)
Platelets: 232 10*3/uL (ref 150–400)
RBC: 3.7 MIL/uL — ABNORMAL LOW (ref 3.87–5.11)
RDW: 18.5 % — ABNORMAL HIGH (ref 11.5–15.5)
WBC: 10.1 10*3/uL (ref 4.0–10.5)
nRBC: 0 % (ref 0.0–0.2)

## 2021-08-06 LAB — GLUCOSE, CAPILLARY
Glucose-Capillary: 152 mg/dL — ABNORMAL HIGH (ref 70–99)
Glucose-Capillary: 160 mg/dL — ABNORMAL HIGH (ref 70–99)
Glucose-Capillary: 133 mg/dL — ABNORMAL HIGH (ref 70–99)
Glucose-Capillary: 104 mg/dL — ABNORMAL HIGH (ref 70–99)

## 2021-08-06 MED ORDER — PIPERACILLIN-TAZOBACTAM IN DEX 2-0.25 GM/50ML IV SOLN
2.2500 g | Freq: Three times a day (TID) | INTRAVENOUS | Status: AC
  Administered 2021-08-06 – 2021-08-10 (×14): 2.25 g via INTRAVENOUS
  Filled 2021-08-06 (×15): qty 50

## 2021-08-06 MED ORDER — HYDROMORPHONE HCL 1 MG/ML IJ SOLN
1.0000 mg | Freq: Once | INTRAMUSCULAR | Status: AC
  Administered 2021-08-06: 1 mg via INTRAVENOUS
  Filled 2021-08-06: qty 1

## 2021-08-06 NOTE — Progress Notes (Signed)
RT note. Patient placed on auto cpap 20/5 and resting comfortable, RT will continue to monitor

## 2021-08-06 NOTE — Progress Notes (Signed)
PROGRESS NOTE    [name]  FXT:[id number] DOB: [birth year] DOA: 06/21/2021 PCP: Noreene Larsson, NP   Brief Narrative: [name] is a 55 y.o. female with a history of ESRD on HD, diet-controlled diabetes mellitus type 2, hypertension, heart failure, COPD, chronic Edema of left leg.  Patient presented secondary to right hip and leg pain with evidence of cellulitis.  She was started on empiric antibiotics but continued to have issues with hypotension with evidence of cardiogenic shock from biventricular failure and pulm hypertension.  She is managed with vasopressors in the ICU and nephrology was managing fluid via hemodialysis.  Blood pressure is better controlled and stable on midodrine. Discharge complicated by patient deconditioned state and requirement of tolerating HD in a chair.   Assessment & Plan:   Principal Problem:   Hypotension Active Problems:   Lymphedema of lower extremity   Morbid obesity (HCC)   Obesity hypoventilation syndrome (HCC)   Cellulitis   ESRD on dialysis ([place])   Staphylococcus epidermidis bacteremia   Elevated troponin   Chronic right heart failure (HCC)   Type 2 diabetes mellitus with hyperglycemia (HCC)   Lymphedema   Cardiogenic shock (HCC)   Bilateral calf pain   Pressure injury of skin   Pulmonary hypertension (HCC)   Cardiogenic shock Secondary to RV failure. Patient required Vasopressor support managed by heart failure team. Resolved. Midodrine started for blood pressure support.   Acute on chronic diastolic heart failure RV failure Managed with HD. EF of 55-60%.  Left medial thigh wound Prior CT without clear evidence of infection. Wound measures  5 x 11 cm. Concern for possible acute infectious process on 8/16 but wound is not highly suggestive of infection except for worsening smell of wound. No fevers, increased pain or leukocytosis. Wound care re-consulted with recommendations. Worsening pain of wound. -Will discuss with  plastic surgery -Since patient is having worsened pain, will cover with Zosyn IV to cover pseudomonas, in addition to repeat CBC  Infected RUE midline catheter Midline removed and patient treated with linezolid for 5 days.  Atypical atrial flutter Patient underwent DCCV on 7/15 with reversion to normal sinus rhythm. -Continue amiodarone, Eliquis  ESRD Nephrology consulted. AVF largely occluded. Patient undergoing HD. Plan for outpatient HD if she can tolerate upright HD in a chair.  Intractable nausea/vomiting Resolved.  Hyperkalemia Management with HD  Possible OSA/OHS Pulmonary hypertension Started on CPAP during hospitalization -Continue CPAP with recommendation for outpatient sleep study  Fibromyalgia -Continue Cymbalta  Anemia of chronic disease Hemoglobin stable.  Diabetes mellitus, type 2 Hemoglobin A1c of 6.9%. Not on medication management as an outpatient.  Started on insulin sliding scale inpatient. -Continue SSI  Morbid obesity Body mass index is 49.67 kg/m.  Weakness PT/OT recommending SNF.    Pressure injury Right/posterior/proximal thigh. Not present on admission.   DVT prophylaxis: Eliquis Code Status:   Code Status: Full Code Family Communication: Two sisters at bedside Disposition Plan: Discharge to SNF once able to tolerate outpatient HD. Patient will need to tolerate HD while seated.   Consultants:  Nephrology Infectious disease Plastic surgery  Procedures:  HEMODIALYSIS  Antimicrobials: Linezolid Daptomycin Ceftriaxone Cefepime    Subjective: Left thigh pain from wound. Got up with therapy today without the use of a lift.  Objective: Vitals:   08/06/21 0411 08/06/21 0632 08/06/21 0737 08/06/21 1113  BP: 130/69  116/67 (!) 148/81  Pulse: 76  75 77  Resp: _0 Temp: 97.9 F (36.6  C)  98 F (36.7 C) 98 F (36.7 C)  TempSrc: Oral  Oral Oral  SpO2: 99%  93% 94%  Weight:  135.4 kg    Height:        Intake/Output  Summary (Last 24 hours) at 08/06/2021 1334 Last data filed at 08/06/2021 0800 Gross per 24 hour  Intake 487 ml  Output 2224 ml  Net -1737 ml    Filed Weights   08/05/21 0500 08/05/21 1744 08/06/21 5462  Weight: (!) 136.7 kg 134.5 kg 135.4 kg    Examination:  General exam: Appears calm and comfortable Respiratory system: Clear to auscultation. Respiratory effort normal. Cardiovascular system: S1 & S2 heard, RRR. No murmurs, rubs, gallops or clicks. Gastrointestinal system: Abdomen is nondistended, soft and nontender. No organomegaly or masses felt. Normal bowel sounds heard. Central nervous system: Alert and oriented. No focal neurological deficits. Musculoskeletal: No edema. No calf tenderness Skin: No cyanosis. Left thigh wound with necrotic tissue; eschar and slough; foul odor present; no surrounding erythema. Psychiatry: Judgement and insight appear normal. Mood & affect appropriate.     Data Reviewed: I have personally reviewed following labs and imaging studies  CBC Lab Results  Component Value Date   WBC 8.8 08/04/2021   RBC 3.65 (L) 08/04/2021   HGB 11.0 (L) 08/04/2021   HCT 34.2 (L) 08/04/2021   MCV 93.7 08/04/2021   MCH 30.1 08/04/2021   PLT 265 08/04/2021   MCHC 32.2 08/04/2021   RDW 18.2 (H) 08/04/2021   LYMPHSABS 1.3 06/22/2021   MONOABS 1.1 (H) 06/22/2021   EOSABS 0.0 06/22/2021   BASOSABS 0.0 70/35/0093     Last metabolic panel Lab Results  Component Value Date   NA 131 (L) 08/05/2021   K 4.0 08/05/2021   CL 95 (L) 08/05/2021   CO2 23 08/05/2021   BUN 38 (H) 08/05/2021   CREATININE 5.78 (H) 08/05/2021   GLUCOSE 138 (H) 08/05/2021   GFRNONAA 8 (L) 08/05/2021   GFRAA 5 (L) 07/04/2020   GFRAA 5 (L) 07/04/2020   CALCIUM 7.8 (L) 08/05/2021   PHOS 2.6 08/05/2021   PROT 8.0 07/19/2021   ALBUMIN 3.7 08/05/2021   BILITOT 1.0 07/19/2021   ALKPHOS 287 (H) 07/19/2021   AST 20 07/19/2021   ALT 15 07/19/2021   ANIONGAP 13 08/05/2021    CBG (last 3)   Recent Labs    08/05/21 2057 08/06/21 0635 08/06/21 1111  GLUCAP 180* 152* 160*      GFR: Estimated Creatinine Clearance: 15.3 mL/min (A) (by C-G formula based on SCr of 5.78 mg/dL (H)).  Coagulation Profile: No results for input(s): INR, PROTIME in the last 168 hours.  Recent Results (from the past 240 hour(s))  Surgical pcr screen     Status: None   Collection Time: 07/27/21 10:47 PM   Specimen: Nasal Mucosa; Nasal Swab  Result Value Ref Range Status   MRSA, PCR NEGATIVE NEGATIVE Final   Staphylococcus aureus NEGATIVE NEGATIVE Final    Comment: (NOTE) The Xpert SA Assay (FDA approved for NASAL specimens in patients 26 years of age and older), is one component of a comprehensive surveillance program. It is not intended to diagnose infection nor to guide or monitor treatment. Performed at [facility], [place] [address], [place], [place] [address]          Radiology Studies: No results found.      Scheduled Meds:  amiodarone  200 mg Oral Daily   apixaban  5 mg Oral BID  Chlorhexidine Gluconate Cloth  6 each Topical Daily   Chlorhexidine Gluconate Cloth  6 each Topical Q0600   Chlorhexidine Gluconate Cloth  6 each Topical Q0600   cinacalcet  60 mg Oral Q M,W,F-HD   collagenase   Topical Daily   darbepoetin (ARANESP) injection - DIALYSIS  25 mcg Intravenous Q Wed-HD   DULoxetine  20 mg Oral Daily   feeding supplement (NEPRO CARB STEADY)  237 mL Oral TID BM   insulin aspart  0-15 Units Subcutaneous TID WC   insulin aspart  0-5 Units Subcutaneous QHS   mouth rinse  15 mL Mouth Rinse BID   midodrine  15 mg Oral TID WC   multivitamin  1 tablet Oral QHS   pantoprazole  40 mg Oral BID   polyethylene glycol  17 g Oral Daily   senna  1 tablet Oral Daily   Continuous Infusions:  sodium chloride     promethazine (PHENERGAN) injection (IM or IVPB) Stopped (07/02/21 1831)     LOS: 46 days     Cordelia Poche, MD [facility] 08/06/2021, 1:34  PM  If 7PM-7AM, please contact night-coverage [email or web address]

## 2021-08-06 NOTE — Progress Notes (Signed)
This chaplain attempted F/U spiritual care visit.   The chaplain understands from communication with PT, the Pt. recently finished therapy and is experiencing pain.  The Pt. has family at the bedside. The chaplain observed the NT and RN addressing the Pt. pain.  The chaplain will revisit at a better time for the Pt.

## 2021-08-06 NOTE — Consult Note (Signed)
[facility]  Reason for Consult: Left thigh wound Referring Physician: Dr. Ralph Nettey  [name] is an 55 y.o. female.  HPI: [name] is a 55-year-old female who presented to the [place], ED on 06/19/2021 from dialysis clinic for evaluation of a sending edema and pain in her right leg.   [name] with a past medical history significant for morbid obesity, lymphedema, end-stage renal disease on hemodialysis, CHF, pulmonary hypertension, diabetes mellitus -controlled with diet per [name], COPD.  [name] had previously been on antibiotics for cellulitis starting on 06/11/2021 from PCP.  [name] was admitted to the ICU, placed on pressors.  [name] developed subsequent wound of her left inner thigh, [name] reports today that she is unclear when or how this developed.  She reports that she is feeling much better today than she previously was, she does not endorse any chest pains, fevers, chills, vomiting.  She does report some nausea.  She reports that she lives at home with her husband and granddaughter, reports that she is mostly wheelchair-bound due to lower extremity lymphedema.  Plastic surgery consulted in regards to her left inner thigh wound.  Current dressing changes with Santyl per recommendation of WOC team.  [name] is very pleasant today, sitting up in bedside chair.  Past Medical History:  Diagnosis Date   Allergy    Phreesia 11/04/2020   Anemia, unspecified    Anxiety    Arthritis    Phreesia 11/04/2020   Asthma    as a child   Cancer (HCC)    thyroid   Cellulitis    CHF (congestive heart failure) (HCC)    Chiari malformation    s/p surgery   Chiari malformation    Chronic kidney disease    Phreesia 11/04/2020   Chronic kidney disease, stage 5 (HCC)    Chronic obstructive pulmonary disease, unspecified (HCC)    Chronic pain    Complication of anesthesia 11/28/15   Resp arrest after  conscious  sedation   Compression of brain (HCC)     Constipation    COPD (chronic obstructive pulmonary disease) (HCC)    Phreesia 11/04/2020   Coronary artery disease    40-50% mid LAD 04/29/09, Medical tx. (Dr. Berry)   Diabetes mellitus    Type II- reports being off all medication d/t it being controlled   Diabetes mellitus without complication (HCC)    Phreesia 11/04/2020   Essential (primary) hypertension    Fibromyalgia    Fibromyalgia    History of blood transfusion    hemorrage duinrg pregancy   Hx of echocardiogram 10/2011   EF 55-60%   Hypercholesterolemia    Hyperlipidemia, unspecified    Hypertension    Hypertensive chronic kidney disease with stage 1 through stage 4 chronic kidney disease, or unspecified chronic kidney disease    Hypertensive chronic kidney disease with stage 5 chronic kidney disease or end stage renal disease (HCC)    Lymph edema    Lymphedema, not elsewhere classified    Morbid (severe) obesity due to excess calories (HCC)    Obesity hypoventilation syndrome (HCC)    On home oxygen therapy    "2L; 24/7" (12/21/2018)   Peritonitis, unspecified (HCC)    Pneumonia    in past   Pulmonary hypertension (HCC)    Renal disorder    M/W/F [facility] started dialysis in Dec.2016   S/P colonoscopy 05/26/2007   Dr. Rehman-> sigmoid diverticulosis random biopsies benign   S/P endoscopy 05/01/2009     Dr. Ganem-> pill-induced esophageal ulcerations distal to midesophagus, 2 small ulcers in the antrum of the stomach   Shortness of breath dyspnea    with any exertion or if heart rate  is irregular while on dialysis   Sleep apnea    reports that she no longer needs CPAP due to weight loss   Sleep apnea, unspecified    Type 2 diabetes mellitus with diabetic nephropathy (HCC)    Type 2 diabetes mellitus with hyperglycemia (HCC)     Past Surgical History:  Procedure Laterality Date   .Hemodialysis catheter Right 11/28/2015   A/V FISTULAGRAM N/A 07/26/2017   Procedure: A/V Fistulagram - Left Arm;  Surgeon:  Brabham, Vance W, MD;  [facility];  Service: Cardiovascular;  Laterality: N/A;   ABDOMINAL HYSTERECTOMY     total hysterectomy   ADENOIDECTOMY     AV FISTULA PLACEMENT Left 03/15/2016   Procedure: CREATION OF LEFT ARM ARTERIOVENOUS (AV) FISTULA  ;  Surgeon: Todd F Early, MD;  Location: [facility];  Service: Vascular;  Laterality: Left;   CAPD INSERTION N/A 08/30/2017   Procedure: LAPAROSCOPIC INSERTION CONTINUOUS AMBULATORY PERITONEAL DIALYSIS  (CAPD) CATHETER;  Surgeon: Connor, Chelsea A, MD;  Location: [facility];  Service: General;  Laterality: N/A;   CARDIOVERSION N/A 07/03/2021   Procedure: CARDIOVERSION;  Surgeon: McLean, Dalton S, MD;  Location: [facility];  Service: Cardiovascular;  Laterality: N/A;   CESAREAN SECTION      x 2   CESAREAN SECTION N/A    Phreesia 11/04/2020   CRANIECTOMY SUBOCCIPITAL W/ CERVICAL LAMINECTOMY / CHIARI     FISTULA SUPERFICIALIZATION Left 05/10/2016   Procedure: Left Arm FISTULA SUPERFICIALIZATION;  Surgeon: Todd F Early, MD;  Location: [facility];  Service: Vascular;  Laterality: Left;   IR GENERIC HISTORICAL  08/14/2016   IR REMOVAL TUN ACCESS W/ PORT W/O FL MOD SED 08/14/2016 Daniel Hassell, MD [facility]   IR SINUS/FIST TUBE CHK-NON GI  01/01/2019   IR US GUIDE BX ASP/DRAIN  12/28/2018   MINOR REMOVAL OF PERITONEAL DIALYSIS CATHETER N/A 04/26/2019   Procedure: REMOVAL OF INFECTED PERITONEAL DIALYSIS CATHETER;  Surgeon: Cornett, Thomas, MD;  Location: [facility];  Service: General;  Laterality: N/A;   PERIPHERAL VASCULAR BALLOON ANGIOPLASTY Left 10/18/2019   Procedure: PERIPHERAL VASCULAR BALLOON ANGIOPLASTY;  Surgeon: Clark, Christopher J, MD;  [facility];  Service: Cardiovascular;  Laterality: Left;  arm fistula   PERIPHERAL VASCULAR CATHETERIZATION Left 11/25/2016   Procedure: A/V Fistulagram;  Surgeon: Brian L Chen, MD;  [facility];  Service: Cardiovascular;  Laterality: Left;  arm   PORTACATH PLACEMENT  07/05/2012    Procedure: INSERTION PORT-A-CATH;  Surgeon: Brent C Ziegler, MD;  Location: [facility];  Service: General;  Laterality: Left;  subclavian   portacath removal     RIGHT HEART CATH N/A 08/04/2017   Procedure: RIGHT HEART CATH;  Surgeon: Bensimhon, Daniel R, MD;  [facility];  Service: Cardiovascular;  Laterality: N/A;   RIGHT HEART CATH N/A 07/02/2021   Procedure: RIGHT HEART CATH;  Surgeon: McLean, Dalton S, MD;  [facility];  Service: Cardiovascular;  Laterality: N/A;   TEE WITHOUT CARDIOVERSION N/A 07/03/2021   Procedure: TRANSESOPHAGEAL ECHOCARDIOGRAM (TEE);  Surgeon: McLean, Dalton S, MD;  [facility];  Service: Cardiovascular;  Laterality: N/A;   THYROIDECTOMY, PARTIAL     TONSILLECTOMY      Family History  Problem Relation Age of Onset   Colon   cancer Mother 50   Stroke Mother 52   Coronary artery disease Mother     Social History:  reports that she has never smoked. She has never used smokeless tobacco. She reports that she does not drink alcohol and does not use drugs.  Allergies:  Allergies  Allergen Reactions   Contrast Media [Iodinated Diagnostic Agents] Anaphylaxis, Hives, Swelling and Other (See Comments)    Dye for cardiac cath. Tongue swells   Pneumococcal Vaccines Swelling and Other (See Comments)    Turns skin black, and bodily swelling   Vancomycin Nausea And Vomiting and Other (See Comments)    Infusion "made me feel like I was dying" had to be readmitted to hospital    Medications: I have reviewed the [name]'s current medications.  Results for orders placed or performed during the hospital encounter of 06/21/21 (from the past 48 hour(s))  Glucose, capillary     Status: Abnormal   Collection Time: 08/04/21  4:27 PM  Result Value Ref Range   Glucose-Capillary 118 (H) 70 - 99 mg/dL    Comment: Glucose reference range applies only to samples taken after fasting for at least 8 hours.  Glucose, capillary     Status: Abnormal    Collection Time: 08/04/21  9:17 PM  Result Value Ref Range   Glucose-Capillary 102 (H) 70 - 99 mg/dL    Comment: Glucose reference range applies only to samples taken after fasting for at least 8 hours.  Renal function panel     Status: Abnormal   Collection Time: 08/05/21  1:41 AM  Result Value Ref Range   Sodium 131 (L) 135 - 145 mmol/L   Potassium 4.0 3.5 - 5.1 mmol/L   Chloride 95 (L) 98 - 111 mmol/L   CO2 23 22 - 32 mmol/L   Glucose, Bld 138 (H) 70 - 99 mg/dL    Comment: Glucose reference range applies only to samples taken after fasting for at least 8 hours.   BUN 38 (H) 6 - 20 mg/dL   Creatinine, Ser 5.78 (H) 0.44 - 1.00 mg/dL   Calcium 7.8 (L) 8.9 - 10.3 mg/dL   Phosphorus 2.6 2.5 - 4.6 mg/dL   Albumin 3.7 3.5 - 5.0 g/dL   GFR, Estimated 8 (L) >60 mL/min    Comment: (NOTE) Calculated using the CKD-EPI Creatinine Equation (2021)    Anion gap 13 5 - 15    Comment: Performed at [facility], [address] [place], [place] [address]  Glucose, capillary     Status: Abnormal   Collection Time: 08/05/21  6:33 AM  Result Value Ref Range   Glucose-Capillary 127 (H) 70 - 99 mg/dL    Comment: Glucose reference range applies only to samples taken after fasting for at least 8 hours.  Glucose, capillary     Status: Abnormal   Collection Time: 08/05/21 11:38 AM  Result Value Ref Range   Glucose-Capillary 154 (H) 70 - 99 mg/dL    Comment: Glucose reference range applies only to samples taken after fasting for at least 8 hours.  Glucose, capillary     Status: Abnormal   Collection Time: 08/05/21  6:44 PM  Result Value Ref Range   Glucose-Capillary 133 (H) 70 - 99 mg/dL    Comment: Glucose reference range applies only to samples taken after fasting for at least 8 hours.  Glucose, capillary     Status: Abnormal   Collection Time: 08/05/21  8:57 PM  Result Value Ref Range     Glucose-Capillary 180 (H) 70 - 99 mg/dL    Comment: Glucose reference range applies only to samples  taken after fasting for at least 8 hours.  Glucose, capillary     Status: Abnormal   Collection Time: 08/06/21  6:35 AM  Result Value Ref Range   Glucose-Capillary 152 (H) 70 - 99 mg/dL    Comment: Glucose reference range applies only to samples taken after fasting for at least 8 hours.   Comment 1 Document in Chart   Glucose, capillary     Status: Abnormal   Collection Time: 08/06/21 11:11 AM  Result Value Ref Range   Glucose-Capillary 160 (H) 70 - 99 mg/dL    Comment: Glucose reference range applies only to samples taken after fasting for at least 8 hours.  CBC     Status: Abnormal   Collection Time: 08/06/21  1:44 PM  Result Value Ref Range   WBC 10.1 4.0 - 10.5 K/uL   RBC 3.70 (L) 3.87 - 5.11 MIL/uL   Hemoglobin 11.3 (L) 12.0 - 15.0 g/dL   HCT 34.6 (L) 36.0 - 46.0 %   MCV 93.5 80.0 - 100.0 fL   MCH 30.5 26.0 - 34.0 pg   MCHC 32.7 30.0 - 36.0 g/dL   RDW 18.5 (H) 11.5 - 15.5 %   Platelets 232 150 - 400 K/uL   nRBC 0.0 0.0 - 0.2 %    Comment: Performed at [facility], [address] [place], [place] [address]    No results found.  Review of Systems  Constitutional:  Negative for chills and fever.  Cardiovascular:  Positive for leg swelling. Negative for chest pain.  Gastrointestinal:  Positive for nausea. Negative for vomiting.  Blood pressure 98/63, pulse (!) 117, temperature 97.6 F (36.4 C), temperature source Oral, resp. rate 18, height 5' 5" (1.651 m), weight 135.4 kg, SpO2 91 %. Physical Exam Constitutional:      General: She is not in acute distress.    Appearance: Normal appearance. She is obese. She is not ill-appearing, toxic-appearing or diaphoretic.  HENT:     Head: Normocephalic and atraumatic.  Cardiovascular:     Rate and Rhythm: Normal rate.     Comments: Palpable radial pulses Pulmonary:     Effort: Pulmonary effort is normal.  Musculoskeletal:     Right lower leg: Edema present.     Left lower leg: Edema present.  Neurological:      General: No focal deficit present.     Mental Status: She is alert.  Psychiatric:        Mood and Affect: Mood normal.        Behavior: Behavior normal.   Medial L Thigh: 2 left inner thigh wounds noted.  Larger more lateral wound with necrotic and fibrinous exudate noted.  There is some surrounding inflammatory changes, but no induration or fluid collections noted with palpation.  No erythema noted on exam.  There is a slightly foul odor.  No crepitus noted with palpation.  No purulence is noted.     Assessment/Plan:  [name] is a very ill 55-year-old female who has been hospitalized since June 19, 2021.  [name] developed left inner thigh wound while hospitalized, etiology unknown.  [name] currently receiving wound care with Santyl and wet-to-dry dressings daily.  [name] is not having any acute infectious symptoms today on exam.    Discussed [name] with surgical team, at this time recommend continuing with local wound care with Santyl dressing changes daily as recommended   per WOCN team.  [name] is a poor surgical candidate, we will continue to follow see how she responds to local wound care.  Recommend calling with questions or concerns, I did discuss with the [name] today options of surgical intervention versus local wound care.  Pictures were taken and placed in the [name]'s chart with [name]'s permission.   Ranetta Armacost J Zadaya Cuadra, PA-C 08/06/2021, 4:20 PM      

## 2021-08-06 NOTE — Progress Notes (Signed)
Mobility Specialist: Progress Note   08/06/21 1601  Mobility  Activity Transferred:  Bed to chair  Level of Assistance +2 (takes two people)  Assistive Device MaxiMove  Mobility Out of bed to chair with meals  Mobility Response Tolerated well  Mobility performed by Mobility specialist  Bed Position Chair  $Mobility charge 1 Mobility   Pt assisted to the chair with MaxiMove, +2 for safety. Pt has call bell and phone at her side. Will f/u later to transfer pt back to the bed.   [facility] Leitha Hyppolite Mobility Specialist Mobility Specialist Phone: [phone]

## 2021-08-06 NOTE — Progress Notes (Signed)
Pharmacy Antibiotic Note  [name] is a 55 y.o. female admitted on 06/21/2021 with  wound infection .  Pharmacy has been consulted for Zosyn dosing.  Plan: Start Zosyn 2.25g IV q8h  Height: _0  (165.1 cm) Weight: 135.4 kg (298 lb 8.1 oz) IBW/kg (Calculated) : 57  Temp (24hrs), Avg:98.1 F (36.7 C), Min:97.9 F (36.6 C), Max:98.5 F (36.9 C)  Recent Labs  Lab 07/31/21 0037 08/02/21 0010 08/04/21 0043 08/05/21 0141 08/06/21 1344  WBC 7.7  --  8.8  --  10.1  CREATININE 5.40* 5.15*  --  5.78*  --     Estimated Creatinine Clearance: 15.3 mL/min (A) (by C-G formula based on SCr of 5.78 mg/dL (H)).    Allergies  Allergen Reactions   Contrast Media [Iodinated Diagnostic Agents] Anaphylaxis, Hives, Swelling and Other (See Comments)    Dye for cardiac cath. Tongue swells   Pneumococcal Vaccines Swelling and Other (See Comments)    Turns skin black, and bodily swelling   Vancomycin Nausea And Vomiting and Other (See Comments)    Infusion "made me feel like I was dying" had to be readmitted to hospital    Antimicrobials this admission: Cefazolin 7/1 >> 7/3 Clindamycin 7/1 >> 7/3 Cefepime 7/3 >> 7/5, 7/27  Ceftriaxone 7/27 x1 Doxycycline 7/3  >> 7/5 Metronidazole 7/3  >> 7/5 Daptomycin 7/5 >> 7/9, 7/27 Linezolid 7/28 >> 8/2   Dose adjustments this admission: N/A  Microbiology results: 7/3 MRSA PCR: positive 7/3 Bcx: NGTD  7/4 Bcx: NGTD 7/27 L wound cx: multiple organisms present, none predominant (no staph) 7/27 Bcx: 1/2 staph epi (mecA/C resistance)   Thank you for allowing pharmacy to be a part of this patient's care.  [name] 08/06/2021 2:26 PM

## 2021-08-06 NOTE — Progress Notes (Signed)
Occupational Therapy Treatment Patient Details Name: [name] MRN: [id number] DOB: February 01, 1966 Today's Date: 08/06/2021    History of present illness 55 y.o. female presenting from [place] ED 7/3 with lethargy, hypotension and elevated lactic acid after d/c from [facility] earlier that day with septic shock and cellulitis. Patient found to have AMS, septic shock, acute respiratory failure, acute on chronic RV failure. Transferred to ICU on 7/12 due to hypotension. PMHX significant for obesity, DMII, COPD, CAD, lymphedema, ESRD on HD MWF and CHF.   OT comments  Patient supine in bed and agreeable to OT/PT session. Completing bed mobility with min guard to EOB and mod assist to return supine. Sit to stand from EOB (pulling on recliner back) with mod assist +2, and requires total assist for LB ADLs.  She is highly motivated to regain independence and get stronger but limited by L thigh pain from wound.  Will follow acutely.    Follow Up Recommendations  SNF;LTACH;Supervision/Assistance - 24 hour    Equipment Recommendations  Hospital bed    Recommendations for Other Services      Precautions / Restrictions Precautions Precautions: Fall Precaution Comments: bil LE edema, L thigh wounds Restrictions Weight Bearing Restrictions: No       Mobility Bed Mobility Overal bed mobility: Needs Assistance Bed Mobility: Supine to Sit;Sit to Supine     Supine to sit: Min guard;+2 for safety/equipment;+2 for physical assistance;HOB elevated Sit to supine: Mod assist;+2 for physical assistance;+2 for safety/equipment   General bed mobility comments: transitioned to EOB with min guard, assist for BLE support back to supine    Transfers Overall transfer level: Needs assistance   Transfers: Sit to/from Stand Sit to Stand: Mod assist;+2 physical assistance;+2 safety/equipment         General transfer comment: sit to stand from EOB x 2, pulling from bari chair (turned around) to power up with mod  asist +2 to fully ascend hips    Balance Overall balance assessment: Needs assistance   Sitting balance-Leahy Scale: Fair Sitting balance - Comments: supervision EOB with static sitting and weight shifting   Standing balance support: Bilateral upper extremity supported;During functional activity Standing balance-Leahy Scale: Poor Standing balance comment: reliant on UE support                           ADL either performed or assessed with clinical judgement   ADL Overall ADL's : Needs assistance/impaired     Grooming: Set up;Sitting               Lower Body Dressing: Total assistance;+2 for physical assistance;+2 for safety/equipment;Sit to/from stand Lower Body Dressing Details (indicate cue type and reason): assist for socks supine, mod +2 sit to stand and relies on BUE support             Functional mobility during ADLs: Moderate assistance;+2 for physical assistance;+2 for safety/equipment General ADL Comments: pt session focused on mobility for transfers  and ADLs     Vision       Perception     Praxis      Cognition Arousal/Alertness: Awake/alert Behavior During Therapy: WFL for tasks assessed/performed Overall Cognitive Status: Within Functional Limits for tasks assessed                                 General Comments: Decreased pain tolerance, cooperative demeanor.  Exercises Other Exercises Other Exercises: performed standing mini squats with B UE support on chair. 10x, then 6 x   Shoulder Instructions       General Comments family present and supportive; VSS during session    Pertinent Vitals/ Pain       Pain Assessment: 0-10 Pain Score: 10-Worst pain ever Faces Pain Scale: Hurts worst Pain Location: L thigh wound; increased post activity to 10/10 Pain Descriptors / Indicators: Crying;Discomfort;Grimacing;Burning Pain Intervention(s): Monitored during session;Premedicated before  session;Repositioned;Patient requesting pain meds-RN notified;Heat applied  Home Living                                          Prior Functioning/Environment              Frequency  Min 2X/week        Progress Toward Goals  OT Goals(current goals can now be found in the care plan section)  Progress towards OT goals: Progressing toward goals  Acute Rehab OT Goals Patient Stated Goal: get stronger and return home OT Goal Formulation: With patient  Plan Discharge plan remains appropriate;Frequency remains appropriate    Co-evaluation    PT/OT/SLP Co-Evaluation/Treatment: Yes Reason for Co-Treatment: For patient/therapist safety;To address functional/ADL transfers PT goals addressed during session: Mobility/safety with mobility;Balance OT goals addressed during session: ADL's and self-care      AM-PAC OT "6 Clicks" Daily Activity     Outcome Measure   Help from another person eating meals?: None Help from another person taking care of personal grooming?: A Little Help from another person toileting, which includes using toliet, bedpan, or urinal?: Total Help from another person bathing (including washing, rinsing, drying)?: A Lot Help from another person to put on and taking off regular upper body clothing?: A Little Help from another person to put on and taking off regular lower body clothing?: Total 6 Click Score: 14    End of Session    OT Visit Diagnosis: Other abnormalities of gait and mobility (R26.89);Muscle weakness (generalized) (M62.81);Pain Pain - Right/Left: Left Pain - part of body: Leg   Activity Tolerance Patient tolerated treatment well   Patient Left in bed;with call bell/phone within reach;with family/visitor present;with nursing/sitter in room   Nurse Communication Mobility status;Precautions;Patient requests pain meds        Time: 1045-1109 OT Time Calculation (min): 24 min  Charges: OT General Charges $OT Visit: 1  Visit OT Treatments $Self Care/Home Management : 8-22 mins  Jolaine Artist, OT Acute Rehabilitation Services Pager [phone] Office [phone]    Delight Stare 08/06/2021, 12:19 PM

## 2021-08-06 NOTE — Progress Notes (Signed)
Physical Therapy Treatment Patient Details Name: [name] MRN: [id number] DOB: Jan 10, 1966 Today's Date: 08/06/2021    History of Present Illness 55 y.o. female presenting from [facility] ED 7/3 with lethargy, hypotension and elevated lactic acid after d/c from [facility] earlier that day with septic shock and cellulitis. Patient found to have AMS, septic shock, acute respiratory failure, acute on chronic RV failure. Transferred to ICU on 7/12 due to hypotension. PMHX significant for obesity, DMII, COPD, CAD, lymphedema, ESRD on HD MWF and CHF.    PT Comments    Patient received in bed, agreeable to PT/OT session. Patient's family just arrived to room. Patient is motivated to improve and do things for herself. She required min guard for supine to sit. Mod assist +2 for sit to stand from bed. She pulls up on locked bari chair. She is able to stand for maybe 30 seconds performing mini squats. Performed 2x. Patient limited by pain in left leg and fatigue. She will continue to benefit from skilled PT while here to improve functional independence, strength and activity tolerance.      Follow Up Recommendations  LTACH;SNF     Equipment Recommendations  Wheelchair cushion (measurements PT);Wheelchair (measurements PT);Hospital bed;Other (comment) (TBD at next venue)    Recommendations for Other Services       Precautions / Restrictions Precautions Precautions: Fall Precaution Comments: bil LE edema, L thigh wounds Restrictions Weight Bearing Restrictions: No    Mobility  Bed Mobility Overal bed mobility: Needs Assistance Bed Mobility: Supine to Sit     Supine to sit: Min guard;+2 for physical assistance Sit to supine: Mod assist;+2 for physical assistance   General bed mobility comments: Mod A +2 to bring legs back up onto bed    Transfers Overall transfer level: Needs assistance   Transfers: Sit to/from Stand Sit to Stand: Mod assist;+2 physical assistance         General transfer  comment: Patient able to stand from bed by pulling up on handles of bari chair in room. Min/mod assist +2. Performed 2x  Ambulation/Gait             General Gait Details: unable   Stairs             Wheelchair Mobility    Modified Rankin (Stroke Patients Only)       Balance Overall balance assessment: Needs assistance   Sitting balance-Leahy Scale: Fair Sitting balance - Comments: supervision EOB with static sitting and weight shifting   Standing balance support: Bilateral upper extremity supported;During functional activity Standing balance-Leahy Scale: Poor Standing balance comment: reliant on UE support                            Cognition Arousal/Alertness: Awake/alert Behavior During Therapy: WFL for tasks assessed/performed Overall Cognitive Status: Within Functional Limits for tasks assessed                                 General Comments: Decreased pain tolerance, cooperative demeanor.      Exercises Other Exercises Other Exercises: performed standing mini squats with B UE support on chair. 10x, then 6 x    General Comments        Pertinent Vitals/Pain Pain Assessment: 0-10 Faces Pain Scale: Hurts worst Pain Location: L LE where wound is. Patient crying after mobility Pain Descriptors / Indicators: Crying Pain Intervention(s): Heat applied;Limited activity  within patient's tolerance;Monitored during session;Premedicated before session;Repositioned    Home Living                      Prior Function            PT Goals (current goals can now be found in the care plan section) Acute Rehab PT Goals Patient Stated Goal: get stronger and return home PT Goal Formulation: With patient Time For Goal Achievement: 08/18/21 Potential to Achieve Goals: Fair Additional Goals Additional Goal #1: Pt will tolerate standing for 1 min with min +2 bil UE support with initiation of weight shifting Progress towards PT  goals: Progressing toward goals    Frequency    Min 2X/week      PT Plan Discharge plan needs to be updated;Other (comment) (need to consider LTACH)    Co-evaluation PT/OT/SLP Co-Evaluation/Treatment: Yes Reason for Co-Treatment: For patient/therapist safety;To address functional/ADL transfers PT goals addressed during session: Mobility/safety with mobility;Balance        AM-PAC PT "6 Clicks" Mobility   Outcome Measure  Help needed turning from your back to your side while in a flat bed without using bedrails?: A Lot Help needed moving from lying on your back to sitting on the side of a flat bed without using bedrails?: A Lot Help needed moving to and from a bed to a chair (including a wheelchair)?: Total Help needed standing up from a chair using your arms (e.g., wheelchair or bedside chair)?: A Lot Help needed to walk in hospital room?: Total Help needed climbing 3-5 steps with a railing? : Total 6 Click Score: 9    End of Session Equipment Utilized During Treatment: Oxygen Activity Tolerance: Patient limited by fatigue;Patient limited by pain Patient left: in bed;with call bell/phone within reach;with family/visitor present Nurse Communication: Mobility status PT Visit Diagnosis: Other abnormalities of gait and mobility (R26.89);Muscle weakness (generalized) (M62.81);Pain Pain - Right/Left: Left Pain - part of body: Leg     Time: 7341-9379 PT Time Calculation (min) (ACUTE ONLY): 24 min  Charges:  $Therapeutic Activity: 8-22 mins                    Luka Stohr, PT, GCS 08/06/21,11:48 AM

## 2021-08-06 NOTE — H&P (View-Only) (Signed)
[facility]  Reason for Consult: Left thigh wound Referring Physician: Dr. Cordelia Poche  [name] is an 55 y.o. female.  HPI: Patient is a 55 year old female who presented to the [facility] ED on 06/19/2021 from dialysis clinic for evaluation of a sending edema and pain in her right leg.   Patient with a past medical history significant for morbid obesity, lymphedema, end-stage renal disease on hemodialysis, CHF, pulmonary hypertension, diabetes mellitus -controlled with diet per patient, COPD.  Patient had previously been on antibiotics for cellulitis starting on 06/11/2021 from PCP.  Patient was admitted to the ICU, placed on pressors.  Patient developed subsequent wound of her left inner thigh, patient reports today that she is unclear when or how this developed.  She reports that she is feeling much better today than she previously was, she does not endorse any chest pains, fevers, chills, vomiting.  She does report some nausea.  She reports that she lives at home with her husband and granddaughter, reports that she is mostly wheelchair-bound due to lower extremity lymphedema.  Plastic surgery consulted in regards to her left inner thigh wound.  Current dressing changes with Santyl per recommendation of [facility] team.  Patient is very pleasant today, sitting up in bedside chair.  Past Medical History:  Diagnosis Date   Allergy    Phreesia 11/04/2020   Anemia, unspecified    Anxiety    Arthritis    Phreesia 11/04/2020   Asthma    as a child   Cancer ([place])    thyroid   Cellulitis    CHF (congestive heart failure) ([place])    Chiari malformation    s/p surgery   Chiari malformation    Chronic kidney disease    Phreesia 11/04/2020   Chronic kidney disease, stage 5 (HCC)    Chronic obstructive pulmonary disease, unspecified ([place])    Chronic pain    Complication of anesthesia 11/28/15   Resp arrest after  conscious  sedation   Compression of brain (HCC)     Constipation    COPD (chronic obstructive pulmonary disease) ([place])    Phreesia 11/04/2020   Coronary artery disease    40-50% mid LAD 04/29/09, Medical tx. (Dr. Gwenlyn Found)   Diabetes mellitus    Type II- reports being off all medication d/t it being controlled   Diabetes mellitus without complication ([place])    Phreesia 11/04/2020   Essential (primary) hypertension    Fibromyalgia    Fibromyalgia    History of blood transfusion    hemorrage duinrg pregancy   Hx of echocardiogram 10/2011   EF 55-60%   Hypercholesterolemia    Hyperlipidemia, unspecified    Hypertension    Hypertensive chronic kidney disease with stage 1 through stage 4 chronic kidney disease, or unspecified chronic kidney disease    Hypertensive chronic kidney disease with stage 5 chronic kidney disease or end stage renal disease (HCC)    Lymph edema    Lymphedema, not elsewhere classified    Morbid (severe) obesity due to excess calories (HCC)    Obesity hypoventilation syndrome ([place])    On home oxygen therapy    "2L; 24/7" (12/21/2018)   Peritonitis, unspecified ([place])    Pneumonia    in past   Pulmonary hypertension ([place])    Renal disorder    M/W/F [facility] started dialysis in Dec.2016   S/P colonoscopy 05/26/2007   Dr. Laural Golden sigmoid diverticulosis random biopsies benign   S/P endoscopy 05/01/2009  Dr. Penelope Coop pill-induced esophageal ulcerations distal to midesophagus, 2 small ulcers in the antrum of the stomach   Shortness of breath dyspnea    with any exertion or if heart rate  is irregular while on dialysis   Sleep apnea    reports that she no longer needs CPAP due to weight loss   Sleep apnea, unspecified    Type 2 diabetes mellitus with diabetic nephropathy ([place])    Type 2 diabetes mellitus with hyperglycemia [facility])     Past Surgical History:  Procedure Laterality Date   .Hemodialysis catheter Right 11/28/2015   A/V FISTULAGRAM N/A 07/26/2017   Procedure: A/V Fistulagram - Left Arm;  Surgeon:  Serafina Mitchell, MD;  Location: [place] [facility];  Service: Cardiovascular;  Laterality: N/A;   ABDOMINAL HYSTERECTOMY     total hysterectomy   ADENOIDECTOMY     AV FISTULA PLACEMENT Left 03/15/2016   Procedure: CREATION OF LEFT ARM ARTERIOVENOUS (AV) FISTULA  ;  Surgeon: Rosetta Posner, MD;  Location: [place];  Service: Vascular;  Laterality: Left;   CAPD INSERTION N/A 08/30/2017   Procedure: LAPAROSCOPIC INSERTION CONTINUOUS AMBULATORY PERITONEAL DIALYSIS  (CAPD) CATHETER;  Surgeon: Clovis Riley, MD;  Location: [place];  Service: General;  Laterality: N/A;   CARDIOVERSION N/A 07/03/2021   Procedure: CARDIOVERSION;  Surgeon: Larey Dresser, MD;  [facility];  Service: Cardiovascular;  Laterality: N/A;   CESAREAN SECTION      x 2   CESAREAN SECTION N/A    Phreesia 11/04/2020   CRANIECTOMY SUBOCCIPITAL W/ CERVICAL LAMINECTOMY / CHIARI     FISTULA SUPERFICIALIZATION Left 05/10/2016   Procedure: Left Arm FISTULA SUPERFICIALIZATION;  Surgeon: Rosetta Posner, MD;  Location: [place];  Service: Vascular;  Laterality: Left;   IR GENERIC HISTORICAL  08/14/2016   IR REMOVAL TUN ACCESS W/ PORT W/O FL MOD SED 08/14/2016 Arne Cleveland, MD [facility]-INTERV RAD   IR SINUS/FIST TUBE CHK-NON GI  01/01/2019   IR US GUIDE BX ASP/DRAIN  12/28/2018   MINOR REMOVAL OF PERITONEAL DIALYSIS CATHETER N/A 04/26/2019   Procedure: REMOVAL OF INFECTED PERITONEAL DIALYSIS CATHETER;  Surgeon: Erroll Luna, MD;  Location: [place];  Service: General;  Laterality: N/A;   PERIPHERAL VASCULAR BALLOON ANGIOPLASTY Left 10/18/2019   Procedure: PERIPHERAL VASCULAR BALLOON ANGIOPLASTY;  Surgeon: Marty Heck, MD;  Location: [place] [facility];  Service: Cardiovascular;  Laterality: Left;  arm fistula   PERIPHERAL VASCULAR CATHETERIZATION Left 11/25/2016   Procedure: A/V Fistulagram;  Surgeon: Conrad Ringgold, MD;  Location: [place] [facility];  Service: Cardiovascular;  Laterality: Left;  arm   PORTACATH PLACEMENT  07/05/2012    Procedure: INSERTION PORT-A-CATH;  Surgeon: Donato Heinz, MD;  Location: [facility];  Service: General;  Laterality: Left;  subclavian   portacath removal     RIGHT HEART CATH N/A 08/04/2017   Procedure: RIGHT HEART CATH;  Surgeon: Jolaine Artist, MD;  Location: [place] [facility];  Service: Cardiovascular;  Laterality: N/A;   RIGHT HEART CATH N/A 07/02/2021   Procedure: RIGHT HEART CATH;  Surgeon: Larey Dresser, MD;  Location: [place] [facility];  Service: Cardiovascular;  Laterality: N/A;   TEE WITHOUT CARDIOVERSION N/A 07/03/2021   Procedure: TRANSESOPHAGEAL ECHOCARDIOGRAM (TEE);  Surgeon: Larey Dresser, MD;  Location: [facility] ENDOSCOPY;  Service: Cardiovascular;  Laterality: N/A;   THYROIDECTOMY, PARTIAL     TONSILLECTOMY      Family History  Problem Relation Age of Onset   Colon  cancer Mother 31   Stroke Mother 37   Coronary artery disease Mother     Social History:  reports that she has never smoked. She has never used smokeless tobacco. She reports that she does not drink alcohol and does not use drugs.  Allergies:  Allergies  Allergen Reactions   Contrast Media [Iodinated Diagnostic Agents] Anaphylaxis, Hives, Swelling and Other (See Comments)    Dye for cardiac cath. Tongue swells   Pneumococcal Vaccines Swelling and Other (See Comments)    Turns skin black, and bodily swelling   Vancomycin Nausea And Vomiting and Other (See Comments)    Infusion "made me feel like I was dying" had to be readmitted to hospital    Medications: I have reviewed the patient's current medications.  Results for orders placed or performed during the hospital encounter of 06/21/21 (from the past 48 hour(s))  Glucose, capillary     Status: Abnormal   Collection Time: 08/04/21  4:27 PM  Result Value Ref Range   Glucose-Capillary 118 (H) 70 - 99 mg/dL    Comment: Glucose reference range applies only to samples taken after fasting for at least 8 hours.  Glucose, capillary     Status: Abnormal    Collection Time: 08/04/21  9:17 PM  Result Value Ref Range   Glucose-Capillary 102 (H) 70 - 99 mg/dL    Comment: Glucose reference range applies only to samples taken after fasting for at least 8 hours.  Renal function panel     Status: Abnormal   Collection Time: 08/05/21  1:41 AM  Result Value Ref Range   Sodium 131 (L) 135 - 145 mmol/L   Potassium 4.0 3.5 - 5.1 mmol/L   Chloride 95 (L) 98 - 111 mmol/L   CO2 23 22 - 32 mmol/L   Glucose, Bld 138 (H) 70 - 99 mg/dL    Comment: Glucose reference range applies only to samples taken after fasting for at least 8 hours.   BUN 38 (H) 6 - 20 mg/dL   Creatinine, Ser 5.78 (H) 0.44 - 1.00 mg/dL   Calcium 7.8 (L) 8.9 - 10.3 mg/dL   Phosphorus 2.6 2.5 - 4.6 mg/dL   Albumin 3.7 3.5 - 5.0 g/dL   GFR, Estimated 8 (L) >60 mL/min    Comment: (NOTE) Calculated using the CKD-EPI Creatinine Equation (2021)    Anion gap 13 5 - 15    Comment: Performed at [facility] [address], [place], [place] [address]  Glucose, capillary     Status: Abnormal   Collection Time: 08/05/21  6:33 AM  Result Value Ref Range   Glucose-Capillary 127 (H) 70 - 99 mg/dL    Comment: Glucose reference range applies only to samples taken after fasting for at least 8 hours.  Glucose, capillary     Status: Abnormal   Collection Time: 08/05/21 11:38 AM  Result Value Ref Range   Glucose-Capillary 154 (H) 70 - 99 mg/dL    Comment: Glucose reference range applies only to samples taken after fasting for at least 8 hours.  Glucose, capillary     Status: Abnormal   Collection Time: 08/05/21  6:44 PM  Result Value Ref Range   Glucose-Capillary 133 (H) 70 - 99 mg/dL    Comment: Glucose reference range applies only to samples taken after fasting for at least 8 hours.  Glucose, capillary     Status: Abnormal   Collection Time: 08/05/21  8:57 PM  Result Value Ref Range  Glucose-Capillary 180 (H) 70 - 99 mg/dL    Comment: Glucose reference range applies only to samples  taken after fasting for at least 8 hours.  Glucose, capillary     Status: Abnormal   Collection Time: 08/06/21  6:35 AM  Result Value Ref Range   Glucose-Capillary 152 (H) 70 - 99 mg/dL    Comment: Glucose reference range applies only to samples taken after fasting for at least 8 hours.   Comment 1 Document in Chart   Glucose, capillary     Status: Abnormal   Collection Time: 08/06/21 11:11 AM  Result Value Ref Range   Glucose-Capillary 160 (H) 70 - 99 mg/dL    Comment: Glucose reference range applies only to samples taken after fasting for at least 8 hours.  CBC     Status: Abnormal   Collection Time: 08/06/21  1:44 PM  Result Value Ref Range   WBC 10.1 4.0 - 10.5 K/uL   RBC 3.70 (L) 3.87 - 5.11 MIL/uL   Hemoglobin 11.3 (L) 12.0 - 15.0 g/dL   HCT 34.6 (L) 36.0 - 46.0 %   MCV 93.5 80.0 - 100.0 fL   MCH 30.5 26.0 - 34.0 pg   MCHC 32.7 30.0 - 36.0 g/dL   RDW 18.5 (H) 11.5 - 15.5 %   Platelets 232 150 - 400 K/uL   nRBC 0.0 0.0 - 0.2 %    Comment: Performed at [facility] [address], [place], [place] [address]    No results found.  Review of Systems  Constitutional:  Negative for chills and fever.  Cardiovascular:  Positive for leg swelling. Negative for chest pain.  Gastrointestinal:  Positive for nausea. Negative for vomiting.  Blood pressure 98/63, pulse (!) 117, temperature 97.6 F (36.4 C), temperature source Oral, resp. rate 18, height _0  (1.651 m), weight 135.4 kg, SpO2 91 %. Physical Exam Constitutional:      General: She is not in acute distress.    Appearance: Normal appearance. She is obese. She is not ill-appearing, toxic-appearing or diaphoretic.  HENT:     Head: Normocephalic and atraumatic.  Cardiovascular:     Rate and Rhythm: Normal rate.     Comments: Palpable radial pulses Pulmonary:     Effort: Pulmonary effort is normal.  Musculoskeletal:     Right lower leg: Edema present.     Left lower leg: Edema present.  Neurological:      General: No focal deficit present.     Mental Status: She is alert.  Psychiatric:        Mood and Affect: Mood normal.        Behavior: Behavior normal.   Medial L Thigh: 2 left inner thigh wounds noted.  Larger more lateral wound with necrotic and fibrinous exudate noted.  There is some surrounding inflammatory changes, but no induration or fluid collections noted with palpation.  No erythema noted on exam.  There is a slightly foul odor.  No crepitus noted with palpation.  No purulence is noted.     Assessment/Plan:  Patient is a very ill 55 year old female who has been hospitalized since June 19, 2021.  Patient developed left inner thigh wound while hospitalized, etiology unknown.  Patient currently receiving wound care with Santyl and wet-to-dry dressings daily.  Patient is not having any acute infectious symptoms today on exam.    Discussed patient with surgical team, at this time recommend continuing with local wound care with Santyl dressing changes daily as recommended  per [facility] team.  Patient is a poor surgical candidate, we will continue to follow see how she responds to local wound care.  Recommend calling with questions or concerns, I did discuss with the patient today options of surgical intervention versus local wound care.  Pictures were taken and placed in the patient's chart with patient's permission.   Carola Rhine Elon Lomeli, PA-C 08/06/2021, 4:20 PM

## 2021-08-06 NOTE — Progress Notes (Signed)
Patient ID: [name], female   DOB: [birth year], 55 y.o.   MRN: [id number] [facility] Progress Note   Assessment/ Plan:   1.  Acute exacerbation of chronic right ventricular heart failure with pulmonary hypertension/shock: Remains on midodrine and as needed albumin at hemodialysis for hypotension.  Continue hemodialysis while maintaining systolic blood pressure >55. 2. ESRD: On a Monday/Wednesday/Friday schedule and had hemodialysis yesterday.  Encouraged to try and prolong her duration of sitting in a recliner so that she can be in a better position for discharge home/to SNF for outpatient hemodialysis versus LTAC for continued physical rehabilitation. 3. Anemia: Without overt blood loss, continue ESA with surveillance of hemoglobin/hematocrit. 4. CKD-MBD: Continue Sensipar for PTH control.  Calcium and phosphorus levels currently at acceptable levels and I will discontinue calcium acetate at this time. 5. Nutrition: Continue renal diet with oral nutritional supplementation 6. Hypertension: Blood pressures currently at goal, monitor with hemodialysis/ultrafiltration.  We will revise dry weight at the time of discharge.  Subjective:   Transiently tachycardic at hemodialysis but remained hemodynamically stable at the end of treatment.   Objective:   BP 116/67 (BP Location: Right Arm)   Pulse 75   Temp 98 F (36.7 C) (Oral)   Resp 17   Ht _0  (1.651 m)   Wt 135.4 kg   SpO2 93%   BMI 49.67 kg/m   Physical Exam: Gen: Notably resting in bed, family member at her side CVS: Pulse regular rhythm, normal rate, S1 and S2 normal Resp: Clear to auscultation bilaterally, no rales/rhonchi Abd: Soft, obese, nontender Ext: Chronic lymphedema with venous stasis changes bilaterally.  Left inner thigh wound noted.  Left RCF with intact dressings.  Labs: BMET Recent Labs  Lab 07/31/21 0037 08/02/21 0010 08/05/21 0141  NA 132* 131* 131*  K 4.6 4.5 4.0  CL 94* 92* 95*   CO2 _1 GLUCOSE 96 147* 138*  BUN 42* 41* 38*  CREATININE 5.40* 5.15* 5.78*  CALCIUM 8.1* 7.9* 7.8*  PHOS 3.2 2.7 2.6   CBC Recent Labs  Lab 07/31/21 0037 08/04/21 0043  WBC 7.7 8.8  HGB 10.5* 11.0*  HCT 33.2* 34.2*  MCV 96.5 93.7  PLT 250 265      Medications:     amiodarone  200 mg Oral Daily   apixaban  5 mg Oral BID   calcium acetate  667 mg Oral TID WC   Chlorhexidine Gluconate Cloth  6 each Topical Daily   Chlorhexidine Gluconate Cloth  6 each Topical Q0600   Chlorhexidine Gluconate Cloth  6 each Topical Q0600   cinacalcet  60 mg Oral Q M,W,F-HD   collagenase   Topical Daily   darbepoetin (ARANESP) injection - DIALYSIS  25 mcg Intravenous Q Wed-HD   DULoxetine  20 mg Oral Daily   feeding supplement (NEPRO CARB STEADY)  237 mL Oral TID BM   insulin aspart  0-15 Units Subcutaneous TID WC   insulin aspart  0-5 Units Subcutaneous QHS   mouth rinse  15 mL Mouth Rinse BID   midodrine  15 mg Oral TID WC   multivitamin  1 tablet Oral QHS   pantoprazole  40 mg Oral BID   polyethylene glycol  17 g Oral Daily   senna  1 tablet Oral Daily   Elmarie Shiley, MD 08/06/2021, 9:02 AM

## 2021-08-07 DIAGNOSIS — L03115 Cellulitis of right lower limb: Secondary | ICD-10-CM | POA: Diagnosis not present

## 2021-08-07 DIAGNOSIS — I9589 Other hypotension: Secondary | ICD-10-CM | POA: Diagnosis not present

## 2021-08-07 DIAGNOSIS — R570 Cardiogenic shock: Secondary | ICD-10-CM | POA: Diagnosis not present

## 2021-08-07 DIAGNOSIS — I50812 Chronic right heart failure: Secondary | ICD-10-CM | POA: Diagnosis not present

## 2021-08-07 LAB — BASIC METABOLIC PANEL
Anion gap: 15 (ref 5–15)
BUN: 37 mg/dL — ABNORMAL HIGH (ref 6–20)
CO2: 24 mmol/L (ref 22–32)
Calcium: 8.3 mg/dL — ABNORMAL LOW (ref 8.9–10.3)
Chloride: 93 mmol/L — ABNORMAL LOW (ref 98–111)
Creatinine, Ser: 5.57 mg/dL — ABNORMAL HIGH (ref 0.44–1.00)
GFR, Estimated: 8 mL/min — ABNORMAL LOW (ref >60)
Glucose, Bld: 95 mg/dL (ref 70–99)
Potassium: 4 mmol/L (ref 3.5–5.1)
Sodium: 132 mmol/L — ABNORMAL LOW (ref 135–145)

## 2021-08-07 LAB — GLUCOSE, CAPILLARY
Glucose-Capillary: 110 mg/dL — ABNORMAL HIGH (ref 70–99)
Glucose-Capillary: 213 mg/dL — ABNORMAL HIGH (ref 70–99)
Glucose-Capillary: 100 mg/dL — ABNORMAL HIGH (ref 70–99)

## 2021-08-07 MED ORDER — MIDODRINE HCL 5 MG PO TABS
ORAL_TABLET | ORAL | Status: AC
  Administered 2021-08-07: 15 mg via ORAL
  Filled 2021-08-07: qty 3

## 2021-08-07 MED ORDER — ALBUMIN HUMAN 25 % IV SOLN
INTRAVENOUS | Status: AC
  Administered 2021-08-07: 25 g
  Filled 2021-08-07: qty 100

## 2021-08-07 MED ORDER — ONDANSETRON HCL 4 MG/2ML IJ SOLN
INTRAMUSCULAR | Status: AC
  Filled 2021-08-07: qty 2

## 2021-08-07 NOTE — Care Management Important Message (Signed)
Important Message  Patient Details  Name: [name] MRN: [id number] Date of Birth: 07/26/1966   Medicare Important Message Given:  Yes     Shelda Altes 08/07/2021, 9:01 AM

## 2021-08-07 NOTE — Progress Notes (Signed)
[facility] NEPHROLOGY PROGRESS NOTE  Assessment/ Plan:  # Acute exacerbation of chronic right ventricular heart failure with pulmonary hypertension/shock: Remains on midodrine and as needed albumin at hemodialysis for hypotension.  Continue hemodialysis while maintaining systolic blood pressure >75.  # ESRD, MWF: Encouraged to try and prolong her duration of sitting in a recliner so that she can be in a better position for discharge home/to SNF for outpatient hemodialysis versus LTAC for continued physical rehabilitation. Plan for HD today.   # Anemia: hb at goal, continue ESA with surveillance of hemoglobin/hematocrit.  # CKD-MBD: Continue Sensipar for PTH control.  Calcium and phosphorus levels currently at acceptable levels, off of calcium acetate.  # Nutrition: Continue renal diet with oral nutritional supplementation.  # Hypotension/volumes: Blood pressures currently at goal, monitor with hemodialysis/ultrafiltration.  She is requiring midodrine higher dose.  Subjective: Seen and examined bedside.  Monday was her birthday and today is her anniversary.  Plan for dialysis today.  No new event.  No complaint. Objective Vital signs in last 24 hours: Vitals:   08/06/21 2328 08/07/21 0416 08/07/21 0615 08/07/21 1020  BP: 110/65 107/64  118/67  Pulse: 75 70  70  Resp: _0 Temp: 98 F (36.7 C) 98.1 F (36.7 C)  97.7 F (36.5 C)  TempSrc: Oral Axillary  Oral  SpO2: 90% 93%  94%  Weight:   (!) 137.3 kg   Height:       Weight change: 2.8 kg  Intake/Output Summary (Last 24 hours) at 08/07/2021 1115 Last data filed at 08/06/2021 2207 Gross per 24 hour  Intake 536.96 ml  Output --  Net 536.96 ml       Labs: Basic Metabolic Panel: Recent Labs  Lab 08/02/21 0010 08/05/21 0141 08/07/21 0211  NA 131* 131* 132*  K 4.5 4.0 4.0  CL 92* 95* 93*  CO2 _1 GLUCOSE 147* 138* 95  BUN 41* 38* 37*  CREATININE 5.15* 5.78* 5.57*  CALCIUM 7.9* 7.8* 8.3*   PHOS 2.7 2.6  --    Liver Function Tests: Recent Labs  Lab 08/02/21 0010 08/05/21 0141  ALBUMIN 3.8 3.7   No results for input(s): LIPASE, AMYLASE in the last 168 hours. No results for input(s): AMMONIA in the last 168 hours. CBC: Recent Labs  Lab 08/04/21 0043 08/06/21 1344  WBC 8.8 10.1  HGB 11.0* 11.3*  HCT 34.2* 34.6*  MCV 93.7 93.5  PLT 265 232   Cardiac Enzymes: No results for input(s): CKTOTAL, CKMB, CKMBINDEX, TROPONINI in the last 168 hours. CBG: Recent Labs  Lab 08/06/21 0635 08/06/21 1111 08/06/21 1618 08/06/21 2043 08/07/21 0613  GLUCAP 152* 160* 133* 104* 110*    Iron Studies: No results for input(s): IRON, TIBC, TRANSFERRIN, FERRITIN in the last 72 hours. Studies/Results: No results found.  Medications: Infusions:  sodium chloride     piperacillin-tazobactam (ZOSYN)  IV 2.25 g (08/07/21 0640)   promethazine (PHENERGAN) injection (IM or IVPB) Stopped (07/02/21 1831)    Scheduled Medications:  amiodarone  200 mg Oral Daily   apixaban  5 mg Oral BID   Chlorhexidine Gluconate Cloth  6 each Topical Daily   Chlorhexidine Gluconate Cloth  6 each Topical Q0600   Chlorhexidine Gluconate Cloth  6 each Topical Q0600   cinacalcet  60 mg Oral Q M,W,F-HD   collagenase   Topical Daily   darbepoetin (ARANESP) injection - DIALYSIS  25 mcg Intravenous Q Wed-HD   DULoxetine  20 mg Oral  Daily   feeding supplement (NEPRO CARB STEADY)  237 mL Oral TID BM   insulin aspart  0-15 Units Subcutaneous TID WC   insulin aspart  0-5 Units Subcutaneous QHS   mouth rinse  15 mL Mouth Rinse BID   midodrine  15 mg Oral TID WC   multivitamin  1 tablet Oral QHS   pantoprazole  40 mg Oral BID   polyethylene glycol  17 g Oral Daily   senna  1 tablet Oral Daily    have reviewed scheduled and prn medications.  Physical Exam: General:NAD, comfortable Heart:RRR, s1s2 nl Lungs:clear b/l, no crackle Abdomen:soft, Non-tender, non-distended Extremities: Some venous stasis  changes. Dialysis Access: Left AV fistula has good thrill and bruit.  Derita Michelsen Prasad Hortensia Duffin 08/07/2021,11:15 AM  LOS: 47 days

## 2021-08-07 NOTE — Progress Notes (Signed)
PROGRESS NOTE    [name]  XEN:[id number] DOB: 10/29/1966 DOA: 06/21/2021 PCP: Noreene Larsson, NP   Brief Narrative: [name] is a 55 y.o. female with a history of ESRD on HD, diet-controlled diabetes mellitus type 2, hypertension, heart failure, COPD, chronic Edema of left leg.  Patient presented secondary to right hip and leg pain with evidence of cellulitis.  She was started on empiric antibiotics but continued to have issues with hypotension with evidence of cardiogenic shock from biventricular failure and pulm hypertension.  She is managed with vasopressors in the ICU and nephrology was managing fluid via hemodialysis.  Blood pressure is better controlled and stable on midodrine. Discharge complicated by patient deconditioned state and requirement of tolerating HD in a chair.   Assessment & Plan:   Principal Problem:   Hypotension Active Problems:   Lymphedema of lower extremity   Morbid obesity (HCC)   Obesity hypoventilation syndrome (HCC)   Cellulitis   ESRD on dialysis ([place])   Staphylococcus epidermidis bacteremia   Elevated troponin   Chronic right heart failure (HCC)   Type 2 diabetes mellitus with hyperglycemia (HCC)   Lymphedema   Cardiogenic shock (HCC)   Bilateral calf pain   Pressure injury of skin   Pulmonary hypertension (HCC)   Cardiogenic shock Secondary to RV failure. Patient required Vasopressor support managed by heart failure team. Resolved. Midodrine started for blood pressure support.   Acute on chronic diastolic heart failure RV failure Managed with HD. EF of 55-60%.  Left medial thigh wound Prior CT without clear evidence of infection. Wound measures  5 x 11 cm. Concern for possible acute infectious process on 8/16 but wound is not highly suggestive of infection except for worsening smell of wound. No fevers, increased pain or leukocytosis. Wound care re-consulted with recommendations. Worsening pain of wound. -Will discuss with  plastic surgery -Zosyn x5 days for pain/foul smelling aspects of her wound  Infected RUE midline catheter Midline removed and patient treated with linezolid for 5 days.  Atypical atrial flutter Patient underwent DCCV on 7/15 with reversion to normal sinus rhythm. -Continue amiodarone, Eliquis  ESRD Nephrology consulted. AVF largely occluded. Patient undergoing HD. Plan for outpatient HD if she can tolerate upright HD in a chair.  Intractable nausea/vomiting Resolved.  Hyperkalemia Management with HD  Possible OSA/OHS Pulmonary hypertension Started on CPAP during hospitalization -Continue CPAP with recommendation for outpatient sleep study  Fibromyalgia -Continue Cymbalta  Anemia of chronic disease Hemoglobin stable.  Diabetes mellitus, type 2 Hemoglobin A1c of 6.9%. Not on medication management as an outpatient.  Started on insulin sliding scale inpatient. -Continue SSI  Morbid obesity Body mass index is 50.37 kg/m.  Weakness PT/OT recommending SNF.    Pressure injury Right/posterior/proximal thigh. Not present on admission.   DVT prophylaxis: Eliquis Code Status:   Code Status: Full Code Family Communication: None at bedside Disposition Plan: Discharge to SNF once able to tolerate outpatient HD. Patient will need to tolerate HD while seated.   Consultants:  Nephrology Infectious disease Plastic surgery  Procedures:  HEMODIALYSIS  Antimicrobials: Linezolid Daptomycin Ceftriaxone Cefepime    Subjective: Still with some thigh pain. Transferred to chair via lift yesterday but sat in chair for multiple hours.  Objective: Vitals:   08/06/21 2040 08/06/21 2328 08/07/21 0416 08/07/21 0615  BP: (!) 108/58 110/65 107/64   Pulse: 77 75 70   Resp: _0 Temp: 98.1 F (36.7 C) 98 F (36.7 C) 98.1 F (36.7  C)   TempSrc: Oral Oral Axillary   SpO2: 93% 90% 93%   Weight:    (!) 137.3 kg  Height:        Intake/Output Summary (Last 24 hours) at  08/07/2021 0945 Last data filed at 08/06/2021 2207 Gross per 24 hour  Intake 536.96 ml  Output --  Net 536.96 ml    Filed Weights   08/05/21 1744 08/06/21 0632 08/07/21 0615  Weight: 134.5 kg 135.4 kg (!) 137.3 kg    Examination:  General exam: Appears calm and comfortable Respiratory system: Clear to auscultation. Respiratory effort normal. Cardiovascular system: S1 & S2 heard, RRR. No murmurs, rubs, gallops or clicks. Gastrointestinal system: Abdomen is nondistended, soft and nontender. No organomegaly or masses felt. Normal bowel sounds heard. Central nervous system: Alert and oriented. No focal neurological deficits. Musculoskeletal: Bilateral unna boots Skin: No cyanosis. Psychiatry: Judgement and insight appear normal. Mood & affect appropriate.     Data Reviewed: I have personally reviewed following labs and imaging studies  CBC Lab Results  Component Value Date   WBC 10.1 08/06/2021   RBC 3.70 (L) 08/06/2021   HGB 11.3 (L) 08/06/2021   HCT 34.6 (L) 08/06/2021   MCV 93.5 08/06/2021   MCH 30.5 08/06/2021   PLT 232 08/06/2021   MCHC 32.7 08/06/2021   RDW 18.5 (H) 08/06/2021   LYMPHSABS 1.3 06/22/2021   MONOABS 1.1 (H) 06/22/2021   EOSABS 0.0 06/22/2021   BASOSABS 0.0 76/28/3151     Last metabolic panel Lab Results  Component Value Date   NA 132 (L) 08/07/2021   K 4.0 08/07/2021   CL 93 (L) 08/07/2021   CO2 24 08/07/2021   BUN 37 (H) 08/07/2021   CREATININE 5.57 (H) 08/07/2021   GLUCOSE 95 08/07/2021   GFRNONAA 8 (L) 08/07/2021   GFRAA 5 (L) 07/04/2020   GFRAA 5 (L) 07/04/2020   CALCIUM 8.3 (L) 08/07/2021   PHOS 2.6 08/05/2021   PROT 8.0 07/19/2021   ALBUMIN 3.7 08/05/2021   BILITOT 1.0 07/19/2021   ALKPHOS 287 (H) 07/19/2021   AST 20 07/19/2021   ALT 15 07/19/2021   ANIONGAP 15 08/07/2021    CBG (last 3)  Recent Labs    08/06/21 1618 08/06/21 2043 08/07/21 0613  GLUCAP 133* 104* 110*      GFR: Estimated Creatinine Clearance: 16.1  mL/min (A) (by C-G formula based on SCr of 5.57 mg/dL (H)).  Coagulation Profile: No results for input(s): INR, PROTIME in the last 168 hours.  No results found for this or any previous visit (from the past 240 hour(s)).       Radiology Studies: No results found.      Scheduled Meds:  amiodarone  200 mg Oral Daily   apixaban  5 mg Oral BID   Chlorhexidine Gluconate Cloth  6 each Topical Daily   Chlorhexidine Gluconate Cloth  6 each Topical Q0600   Chlorhexidine Gluconate Cloth  6 each Topical Q0600   cinacalcet  60 mg Oral Q M,W,F-HD   collagenase   Topical Daily   darbepoetin (ARANESP) injection - DIALYSIS  25 mcg Intravenous Q Wed-HD   DULoxetine  20 mg Oral Daily   feeding supplement (NEPRO CARB STEADY)  237 mL Oral TID BM   insulin aspart  0-15 Units Subcutaneous TID WC   insulin aspart  0-5 Units Subcutaneous QHS   mouth rinse  15 mL Mouth Rinse BID   midodrine  15 mg Oral TID WC   multivitamin  1  tablet Oral QHS   pantoprazole  40 mg Oral BID   polyethylene glycol  17 g Oral Daily   senna  1 tablet Oral Daily   Continuous Infusions:  sodium chloride     piperacillin-tazobactam (ZOSYN)  IV 2.25 g (08/07/21 0640)   promethazine (PHENERGAN) injection (IM or IVPB) Stopped (07/02/21 1831)     LOS: 6 days     Cordelia Poche, MD [facility] 08/07/2021, 9:45 AM  If 7PM-7AM, please contact night-coverage [email or web address]

## 2021-08-08 DIAGNOSIS — I9589 Other hypotension: Secondary | ICD-10-CM | POA: Diagnosis not present

## 2021-08-08 DIAGNOSIS — R570 Cardiogenic shock: Secondary | ICD-10-CM | POA: Diagnosis not present

## 2021-08-08 DIAGNOSIS — I50812 Chronic right heart failure: Secondary | ICD-10-CM | POA: Diagnosis not present

## 2021-08-08 DIAGNOSIS — L03115 Cellulitis of right lower limb: Secondary | ICD-10-CM | POA: Diagnosis not present

## 2021-08-08 LAB — GLUCOSE, CAPILLARY
Glucose-Capillary: 96 mg/dL (ref 70–99)
Glucose-Capillary: 133 mg/dL — ABNORMAL HIGH (ref 70–99)
Glucose-Capillary: 151 mg/dL — ABNORMAL HIGH (ref 70–99)
Glucose-Capillary: 101 mg/dL — ABNORMAL HIGH (ref 70–99)

## 2021-08-08 NOTE — Plan of Care (Signed)
  Problem: Clinical Measurements: Goal: Will remain free from infection Outcome: Not Progressing

## 2021-08-08 NOTE — Progress Notes (Signed)
PROGRESS NOTE    [name]  MLV:[id number] DOB: July 20, 1966 DOA: 06/21/2021 PCP: Noreene Larsson, NP   Brief Narrative: [name] is a 54 y.o. female with a history of ESRD on HD, diet-controlled diabetes mellitus type 2, hypertension, heart failure, COPD, chronic Edema of left leg.  Patient presented secondary to right hip and leg pain with evidence of cellulitis.  She was started on empiric antibiotics but continued to have issues with hypotension with evidence of cardiogenic shock from biventricular failure and pulm hypertension.  She is managed with vasopressors in the ICU and nephrology was managing fluid via hemodialysis.  Blood pressure is better controlled and stable on midodrine. Discharge complicated by patient deconditioned state and requirement of tolerating HD in a chair.   Assessment & Plan:   Principal Problem:   Hypotension Active Problems:   Lymphedema of lower extremity   Morbid obesity (HCC)   Obesity hypoventilation syndrome (HCC)   Cellulitis   ESRD on dialysis ([place])   Staphylococcus epidermidis bacteremia   Elevated troponin   Chronic right heart failure (HCC)   Type 2 diabetes mellitus with hyperglycemia (HCC)   Lymphedema   Cardiogenic shock (HCC)   Bilateral calf pain   Pressure injury of skin   Pulmonary hypertension (HCC)   Cardiogenic shock Secondary to RV failure. Patient required Vasopressor support managed by heart failure team. Resolved. Midodrine started for blood pressure support.   Acute on chronic diastolic heart failure RV failure Managed with HD. EF of 55-60%.  Left medial thigh wound Prior CT without clear evidence of infection. Wound measures  5 x 11 cm. Concern for possible acute infectious process on 8/16 but wound is not highly suggestive of infection except for worsening smell of wound. No fevers, increased pain or leukocytosis. Wound care re-consulted with recommendations. Worsening pain of wound. Plastic surgery  consulted and will follow intermittently. -Zosyn x5 days for pain/foul smelling aspects of her wound  Infected RUE midline catheter Midline removed and patient treated with linezolid for 5 days.  Atypical atrial flutter Patient underwent DCCV on 7/15 with reversion to normal sinus rhythm. -Continue amiodarone, Eliquis  ESRD Nephrology consulted. AVF largely occluded. Patient undergoing HD. Plan for outpatient HD if she can tolerate upright HD in a chair.  Intractable nausea/vomiting Resolved.  Hyperkalemia Management with HD  Possible OSA/OHS Pulmonary hypertension Started on CPAP during hospitalization -Continue CPAP with recommendation for outpatient sleep study  Fibromyalgia -Continue Cymbalta  Anemia of chronic disease Hemoglobin stable.  Diabetes mellitus, type 2 Hemoglobin A1c of 6.9%. Not on medication management as an outpatient.  Started on insulin sliding scale inpatient. -Continue SSI  Morbid obesity Body mass index is 49.42 kg/m.  Weakness PT/OT recommending SNF.    Pressure injury Right/posterior/proximal thigh. Not present on admission.   DVT prophylaxis: Eliquis Code Status:   Code Status: Full Code Family Communication: None at bedside Disposition Plan: Discharge to SNF once able to tolerate outpatient HD. Patient will need to tolerate HD while seated.   Consultants:  Nephrology Infectious disease Plastic surgery  Procedures:  HEMODIALYSIS  Antimicrobials: Linezolid Daptomycin Ceftriaxone Cefepime    Subjective: Continued thigh pain. Looking forward to getting out of bed today.  Objective: Vitals:   08/07/21 2002 08/07/21 2320 08/08/21 0413 08/08/21 0829  BP: (!) 99/56 116/69 112/61 114/69  Pulse: 68 68  63  Resp: _0 Temp: 98.5 F (36.9 C) 98.7 F (37.1 C) 98.6 F (37 C) 98.3 F (36.8 C)  TempSrc: Oral Oral Oral Oral  SpO2: 98% 96% 100% 97%  Weight:   134.7 kg   Height:        Intake/Output Summary (Last  24 hours) at 08/08/2021 1012 Last data filed at 08/08/2021 0156 Gross per 24 hour  Intake 251.67 ml  Output 3500 ml  Net -3248.33 ml    Filed Weights   08/07/21 1530 08/07/21 1907 08/08/21 0413  Weight: (!) 143 kg (!) 139.8 kg 134.7 kg    Examination:  General exam: Appears calm and comfortable Respiratory system: Clear to auscultation. Respiratory effort normal. Cardiovascular system: S1 & S2 heard, RRR. No murmurs, rubs, gallops or clicks. Gastrointestinal system: Abdomen is nondistended, soft and nontender. No organomegaly or masses felt. Normal bowel sounds heard. Central nervous system: Alert and oriented. No focal neurological deficits. Musculoskeletal: Bilateral unna boots with stable lymphedema. Skin: No cyanosis. No rashes Psychiatry: Judgement and insight appear normal. Mood & affect appropriate.    Data Reviewed: I have personally reviewed following labs and imaging studies  CBC Lab Results  Component Value Date   WBC 10.1 08/06/2021   RBC 3.70 (L) 08/06/2021   HGB 11.3 (L) 08/06/2021   HCT 34.6 (L) 08/06/2021   MCV 93.5 08/06/2021   MCH 30.5 08/06/2021   PLT 232 08/06/2021   MCHC 32.7 08/06/2021   RDW 18.5 (H) 08/06/2021   LYMPHSABS 1.3 06/22/2021   MONOABS 1.1 (H) 06/22/2021   EOSABS 0.0 06/22/2021   BASOSABS 0.0 82/42/3536     Last metabolic panel Lab Results  Component Value Date   NA 132 (L) 08/07/2021   K 4.0 08/07/2021   CL 93 (L) 08/07/2021   CO2 24 08/07/2021   BUN 37 (H) 08/07/2021   CREATININE 5.57 (H) 08/07/2021   GLUCOSE 95 08/07/2021   GFRNONAA 8 (L) 08/07/2021   GFRAA 5 (L) 07/04/2020   GFRAA 5 (L) 07/04/2020   CALCIUM 8.3 (L) 08/07/2021   PHOS 2.6 08/05/2021   PROT 8.0 07/19/2021   ALBUMIN 3.7 08/05/2021   BILITOT 1.0 07/19/2021   ALKPHOS 287 (H) 07/19/2021   AST 20 07/19/2021   ALT 15 07/19/2021   ANIONGAP 15 08/07/2021    CBG (last 3)  Recent Labs    08/07/21 1217 08/07/21 1949 08/08/21 0600  GLUCAP 213* 100* 96       GFR: Estimated Creatinine Clearance: 15.9 mL/min (A) (by C-G formula based on SCr of 5.57 mg/dL (H)).  Coagulation Profile: No results for input(s): INR, PROTIME in the last 168 hours.  No results found for this or any previous visit (from the past 240 hour(s)).       Radiology Studies: No results found.      Scheduled Meds:  amiodarone  200 mg Oral Daily   apixaban  5 mg Oral BID   Chlorhexidine Gluconate Cloth  6 each Topical Q0600   cinacalcet  60 mg Oral Q M,W,F-HD   collagenase   Topical Daily   darbepoetin (ARANESP) injection - DIALYSIS  25 mcg Intravenous Q Wed-HD   DULoxetine  20 mg Oral Daily   feeding supplement (NEPRO CARB STEADY)  237 mL Oral TID BM   insulin aspart  0-15 Units Subcutaneous TID WC   insulin aspart  0-5 Units Subcutaneous QHS   mouth rinse  15 mL Mouth Rinse BID   midodrine  15 mg Oral TID WC   multivitamin  1 tablet Oral QHS   pantoprazole  40 mg Oral BID   polyethylene glycol  17 g  Oral Daily   senna  1 tablet Oral Daily   Continuous Infusions:  sodium chloride     piperacillin-tazobactam (ZOSYN)  IV 2.25 g (08/08/21 0528)   promethazine (PHENERGAN) injection (IM or IVPB) 12.5 mg (08/08/21 0901)     LOS: 48 days     Cordelia Poche, MD [facility] 08/08/2021, 10:12 AM  If 7PM-7AM, please contact night-coverage [email or web address]

## 2021-08-08 NOTE — Progress Notes (Signed)
[facility] NEPHROLOGY PROGRESS NOTE  Assessment/ Plan:  # Acute exacerbation of chronic right ventricular heart failure with pulmonary hypertension/shock: Remains on midodrine and as needed albumin at hemodialysis for hypotension.  Continue hemodialysis while maintaining systolic blood pressure >51.  # ESRD, MWF: Status post dialysis on 8/19 with 3.5 L UF, tolerated well.  Encouraged to try and prolong her duration of sitting in a recliner so that she can be in a better position for discharge home/to SNF for outpatient hemodialysis versus LTAC for continued physical rehabilitation.  Plan for next HD on 8/22.  # Anemia: hb at goal, continue ESA with surveillance of hemoglobin/hematocrit.  # CKD-MBD: Continue Sensipar for PTH control.  Calcium and phosphorus levels currently at acceptable levels, off of calcium acetate.  # Nutrition: Continue renal diet with oral nutritional supplementation.  # Hypotension/volumes: Blood pressures currently at goal, monitor with hemodialysis/ultrafiltration.  She is requiring midodrine higher dose.  Subjective: Seen and examined bedside.  Tolerated dialysis well.  No new event.  No complaint. Objective Vital signs in last 24 hours: Vitals:   08/07/21 2002 08/07/21 2320 08/08/21 0413 08/08/21 0829  BP: (!) 99/56 116/69 112/61 114/69  Pulse: 68 68  63  Resp: _0 Temp: 98.5 F (36.9 C) 98.7 F (37.1 C) 98.6 F (37 C) 98.3 F (36.8 C)  TempSrc: Oral Oral Oral Oral  SpO2: 98% 96% 100% 97%  Weight:   134.7 kg   Height:       Weight change: 5.7 kg  Intake/Output Summary (Last 24 hours) at 08/08/2021 0943 Last data filed at 08/08/2021 0156 Gross per 24 hour  Intake 251.67 ml  Output 3500 ml  Net -3248.33 ml        Labs: Basic Metabolic Panel: Recent Labs  Lab 08/02/21 0010 08/05/21 0141 08/07/21 0211  NA 131* 131* 132*  K 4.5 4.0 4.0  CL 92* 95* 93*  CO2 _1 GLUCOSE 147* 138* 95  BUN 41* 38* 37*   CREATININE 5.15* 5.78* 5.57*  CALCIUM 7.9* 7.8* 8.3*  PHOS 2.7 2.6  --     Liver Function Tests: Recent Labs  Lab 08/02/21 0010 08/05/21 0141  ALBUMIN 3.8 3.7    No results for input(s): LIPASE, AMYLASE in the last 168 hours. No results for input(s): AMMONIA in the last 168 hours. CBC: Recent Labs  Lab 08/04/21 0043 08/06/21 1344  WBC 8.8 10.1  HGB 11.0* 11.3*  HCT 34.2* 34.6*  MCV 93.7 93.5  PLT 265 232    Cardiac Enzymes: No results for input(s): CKTOTAL, CKMB, CKMBINDEX, TROPONINI in the last 168 hours. CBG: Recent Labs  Lab 08/06/21 2043 08/07/21 0613 08/07/21 1217 08/07/21 1949 08/08/21 0600  GLUCAP 104* 110* 213* 100* 96     Iron Studies: No results for input(s): IRON, TIBC, TRANSFERRIN, FERRITIN in the last 72 hours. Studies/Results: No results found.  Medications: Infusions:  sodium chloride     piperacillin-tazobactam (ZOSYN)  IV 2.25 g (08/08/21 0528)   promethazine (PHENERGAN) injection (IM or IVPB) 12.5 mg (08/08/21 0901)    Scheduled Medications:  amiodarone  200 mg Oral Daily   apixaban  5 mg Oral BID   Chlorhexidine Gluconate Cloth  6 each Topical Q0600   cinacalcet  60 mg Oral Q M,W,F-HD   collagenase   Topical Daily   darbepoetin (ARANESP) injection - DIALYSIS  25 mcg Intravenous Q Wed-HD   DULoxetine  20 mg Oral Daily   feeding supplement (NEPRO CARB  STEADY)  237 mL Oral TID BM   insulin aspart  0-15 Units Subcutaneous TID WC   insulin aspart  0-5 Units Subcutaneous QHS   mouth rinse  15 mL Mouth Rinse BID   midodrine  15 mg Oral TID WC   multivitamin  1 tablet Oral QHS   pantoprazole  40 mg Oral BID   polyethylene glycol  17 g Oral Daily   senna  1 tablet Oral Daily    have reviewed scheduled and prn medications.  Physical Exam: General:NAD, able to lie flat comfortable Heart:RRR, s1s2 nl Lungs:clear b/l, no crackle Abdomen:soft, Non-tender, non-distended Extremities: Some venous stasis changes and has a dressing  on. Dialysis Access: Left AV fistula has good thrill and bruit.  [name] 08/08/2021,9:43 AM  LOS: 48 days

## 2021-08-08 NOTE — Progress Notes (Signed)
Mobility Specialist: Progress Note   08/08/21 1752  Mobility  Activity Sat and stood x 3  Level of Assistance Minimal assist, patient does 75% or more  Assistive Device  (Back of chair)  Mobility Sit up in bed/chair position for meals  Mobility Response Tolerated fair  Mobility performed by Mobility specialist  $Mobility charge 1 Mobility   Pt able to perform STS x3 for pericare and sacral foam change. Pt c/o pain in her R thigh at infection site, no rating given. Pt assisted back to bed by RN and myself. Pt has call bell and phone at her side.   [facility] [name] Mobility Specialist Mobility Specialist Phone: [phone]

## 2021-08-09 DIAGNOSIS — I50812 Chronic right heart failure: Secondary | ICD-10-CM | POA: Diagnosis not present

## 2021-08-09 DIAGNOSIS — R570 Cardiogenic shock: Secondary | ICD-10-CM | POA: Diagnosis not present

## 2021-08-09 LAB — GLUCOSE, CAPILLARY
Glucose-Capillary: 105 mg/dL — ABNORMAL HIGH (ref 70–99)
Glucose-Capillary: 217 mg/dL — ABNORMAL HIGH (ref 70–99)
Glucose-Capillary: 116 mg/dL — ABNORMAL HIGH (ref 70–99)
Glucose-Capillary: 118 mg/dL — ABNORMAL HIGH (ref 70–99)

## 2021-08-09 MED ORDER — CHLORHEXIDINE GLUCONATE CLOTH 2 % EX PADS
6.0000 | MEDICATED_PAD | Freq: Every day | CUTANEOUS | Status: DC
  Administered 2021-08-10 – 2021-08-11 (×2): 6 via TOPICAL

## 2021-08-09 NOTE — Progress Notes (Signed)
PROGRESS NOTE    ELLIETT GUARISCO  PXT:[id number] DOB: [birth year] DOA: 06/21/2021 PCP: Noreene Larsson, NP   Brief Narrative: [name] is a 55 y.o. female with a history of ESRD on HD, diet-controlled diabetes mellitus type 2, hypertension, heart failure, COPD, chronic Edema of left leg.  Patient presented secondary to right hip and leg pain with evidence of cellulitis.  She was started on empiric antibiotics but continued to have issues with hypotension with evidence of cardiogenic shock from biventricular failure and pulm hypertension.  She is managed with vasopressors in the ICU and nephrology was managing fluid via hemodialysis.  Blood pressure is better controlled and stable on midodrine. Discharge complicated by patient deconditioned state and requirement of tolerating HD in a chair.   Assessment & Plan:   Principal Problem:   Hypotension Active Problems:   Lymphedema of lower extremity   Morbid obesity (HCC)   Obesity hypoventilation syndrome (HCC)   Cellulitis   ESRD on dialysis ([place])   Staphylococcus epidermidis bacteremia   Elevated troponin   Chronic right heart failure (HCC)   Type 2 diabetes mellitus with hyperglycemia (HCC)   Lymphedema   Cardiogenic shock (HCC)   Bilateral calf pain   Pressure injury of skin   Pulmonary hypertension (HCC)   Cardiogenic shock Secondary to RV failure. Patient required Vasopressor support managed by heart failure team. Resolved. Midodrine started for blood pressure support.   Acute on chronic diastolic heart failure RV failure Managed with HD. EF of 55-60%.  Left medial thigh wound Prior CT without clear evidence of infection. Wound measures  5 x 11 cm. Concern for possible acute infectious process on 8/16 but wound is not highly suggestive of infection except for worsening smell of wound. No fevers, increased pain or leukocytosis. Wound care re-consulted with recommendations. Worsening pain of wound. Plastic surgery  consulted and will follow intermittently. -Zosyn x5 days for pain/foul smelling aspects of her wound  Infected RUE midline catheter Midline removed and patient treated with linezolid for 5 days.  Atypical atrial flutter Patient underwent DCCV on 7/15 with reversion to normal sinus rhythm. -Continue amiodarone, Eliquis  ESRD Nephrology consulted. AVF largely occluded. Patient undergoing HD. Plan for outpatient HD if she can tolerate upright HD in a chair.  Intractable nausea/vomiting Resolved.  Hyperkalemia Management with HD  Possible OSA/OHS Pulmonary hypertension Started on CPAP during hospitalization -Continue CPAP with recommendation for outpatient sleep study  Fibromyalgia -Continue Cymbalta  Anemia of chronic disease Hemoglobin stable.  Diabetes mellitus, type 2 Hemoglobin A1c of 6.9%. Not on medication management as an outpatient.  Started on insulin sliding scale inpatient. -Continue SSI  Morbid obesity Body mass index is 49.42 kg/m.  Weakness PT/OT recommending SNF.    Pressure injury Right/posterior/proximal thigh. Not present on admission.   DVT prophylaxis: Eliquis Code Status:   Code Status: Full Code Family Communication: None at bedside Disposition Plan: Discharge to SNF once able to tolerate outpatient HD. Patient will need to tolerate HD while seated.   Consultants:  Nephrology Infectious disease Plastic surgery  Procedures:  HEMODIALYSIS  Antimicrobials: Linezolid Daptomycin Ceftriaxone Cefepime    Subjective: No issues noted overnight.  Objective: Vitals:   08/08/21 1628 08/08/21 2300 08/09/21 0550 08/09/21 0839  BP: 101/65 112/65 105/66 115/72  Pulse: 64 72 66 60  Resp: _0 Temp: 98.5 F (36.9 C) 98.7 F (37.1 C) 98.4 F (36.9 C) 98.3 F (36.8 C)  TempSrc: Axillary Oral Oral Oral  SpO2:  98% 98% 97% 94%  Weight:      Height:       No intake or output data in the 24 hours ending 08/09/21 1053  Filed  Weights   08/07/21 1530 08/07/21 1907 08/08/21 0413  Weight: (!) 143 kg (!) 139.8 kg 134.7 kg    Examination:  General: Well appearing, no distress   Data Reviewed: I have personally reviewed following labs and imaging studies  CBC Lab Results  Component Value Date   WBC 10.1 08/06/2021   RBC 3.70 (L) 08/06/2021   HGB 11.3 (L) 08/06/2021   HCT 34.6 (L) 08/06/2021   MCV 93.5 08/06/2021   MCH 30.5 08/06/2021   PLT 232 08/06/2021   MCHC 32.7 08/06/2021   RDW 18.5 (H) 08/06/2021   LYMPHSABS 1.3 06/22/2021   MONOABS 1.1 (H) 06/22/2021   EOSABS 0.0 06/22/2021   BASOSABS 0.0 06/84/0335     Last metabolic panel Lab Results  Component Value Date   NA 132 (L) 08/07/2021   K 4.0 08/07/2021   CL 93 (L) 08/07/2021   CO2 24 08/07/2021   BUN 37 (H) 08/07/2021   CREATININE 5.57 (H) 08/07/2021   GLUCOSE 95 08/07/2021   GFRNONAA 8 (L) 08/07/2021   GFRAA 5 (L) 07/04/2020   GFRAA 5 (L) 07/04/2020   CALCIUM 8.3 (L) 08/07/2021   PHOS 2.6 08/05/2021   PROT 8.0 07/19/2021   ALBUMIN 3.7 08/05/2021   BILITOT 1.0 07/19/2021   ALKPHOS 287 (H) 07/19/2021   AST 20 07/19/2021   ALT 15 07/19/2021   ANIONGAP 15 08/07/2021    CBG (last 3)  Recent Labs    08/08/21 1624 08/08/21 2116 08/09/21 0530  GLUCAP 151* 101* 105*      GFR: Estimated Creatinine Clearance: 15.9 mL/min (A) (by C-G formula based on SCr of 5.57 mg/dL (H)).  Coagulation Profile: No results for input(s): INR, PROTIME in the last 168 hours.  No results found for this or any previous visit (from the past 240 hour(s)).       Radiology Studies: No results found.      Scheduled Meds:  amiodarone  200 mg Oral Daily   apixaban  5 mg Oral BID   Chlorhexidine Gluconate Cloth  6 each Topical Q0600   cinacalcet  60 mg Oral Q M,W,F-HD   collagenase   Topical Daily   darbepoetin (ARANESP) injection - DIALYSIS  25 mcg Intravenous Q Wed-HD   DULoxetine  20 mg Oral Daily   feeding supplement (NEPRO CARB  STEADY)  237 mL Oral TID BM   insulin aspart  0-15 Units Subcutaneous TID WC   insulin aspart  0-5 Units Subcutaneous QHS   mouth rinse  15 mL Mouth Rinse BID   midodrine  15 mg Oral TID WC   multivitamin  1 tablet Oral QHS   pantoprazole  40 mg Oral BID   polyethylene glycol  17 g Oral Daily   senna  1 tablet Oral Daily   Continuous Infusions:  sodium chloride     piperacillin-tazobactam (ZOSYN)  IV 2.25 g (08/09/21 0552)   promethazine (PHENERGAN) injection (IM or IVPB) 12.5 mg (08/08/21 0901)     LOS: 46 days     Cordelia Poche, MD [facility] 08/09/2021, 10:54 AM  If 7PM-7AM, please contact night-coverage [email or web address]

## 2021-08-09 NOTE — Progress Notes (Signed)
Mobility Specialist: Progress Note   08/09/21 1141  Mobility  Activity Refused mobility   Pt refused mobility d/t pain level, no rating given. Pt said her pain medication isn't help her either. Will f/u as able.   [facility] Pierre Cumpton Mobility Specialist Mobility Specialist Phone: [phone]

## 2021-08-09 NOTE — Progress Notes (Signed)
[facility] NEPHROLOGY PROGRESS NOTE  Assessment/ Plan:  # Acute exacerbation of chronic right ventricular heart failure with pulmonary hypertension/shock: Remains on midodrine and as needed albumin at hemodialysis for hypotension.  Continue hemodialysis while maintaining systolic blood pressure >81.  # ESRD, MWF: Status post dialysis on 8/19 with 3.5 L UF, tolerated well.  Encouraged to try and prolong her duration of sitting in a recliner so that she can be in a better position for discharge home/to SNF for outpatient hemodialysis versus LTAC for continued physical rehabilitation.  Plan for next HD on 8/22.  # Anemia: hb at goal, continue ESA with surveillance of hemoglobin/hematocrit.  # CKD-MBD: Continue Sensipar for PTH control.  Calcium and phosphorus levels currently at acceptable levels, off of calcium acetate.  # Nutrition: Continue renal diet with oral nutritional supplementation.  # Hypotension/volumes: Blood pressures currently at goal, monitor with hemodialysis/ultrafiltration.  She is requiring midodrine higher dose.  Subjective: Seen and examined bedside.  No new event.  Denies any complaint.  Objective Vital signs in last 24 hours: Vitals:   08/08/21 2300 08/09/21 0550 08/09/21 0839 08/09/21 1115  BP: 112/65 105/66 115/72 125/75  Pulse: 72 66 60 61  Resp: _0 Temp: 98.7 F (37.1 C) 98.4 F (36.9 C) 98.3 F (36.8 C) 98.3 F (36.8 C)  TempSrc: Oral Oral Oral Oral  SpO2: 98% 97% 94% 91%  Weight:      Height:       Weight change:  No intake or output data in the 24 hours ending 08/09/21 1139      Labs: Basic Metabolic Panel: Recent Labs  Lab 08/05/21 0141 08/07/21 0211  NA 131* 132*  K 4.0 4.0  CL 95* 93*  CO2 23 24  GLUCOSE 138* 95  BUN 38* 37*  CREATININE 5.78* 5.57*  CALCIUM 7.8* 8.3*  PHOS 2.6  --     Liver Function Tests: Recent Labs  Lab 08/05/21 0141  ALBUMIN 3.7    No results for input(s): LIPASE, AMYLASE  in the last 168 hours. No results for input(s): AMMONIA in the last 168 hours. CBC: Recent Labs  Lab 08/04/21 0043 08/06/21 1344  WBC 8.8 10.1  HGB 11.0* 11.3*  HCT 34.2* 34.6*  MCV 93.7 93.5  PLT 265 232    Cardiac Enzymes: No results for input(s): CKTOTAL, CKMB, CKMBINDEX, TROPONINI in the last 168 hours. CBG: Recent Labs  Lab 08/08/21 0600 08/08/21 1221 08/08/21 1624 08/08/21 2116 08/09/21 0530  GLUCAP 96 133* 151* 101* 105*     Iron Studies: No results for input(s): IRON, TIBC, TRANSFERRIN, FERRITIN in the last 72 hours. Studies/Results: No results found.  Medications: Infusions:  sodium chloride     piperacillin-tazobactam (ZOSYN)  IV 2.25 g (08/09/21 0552)   promethazine (PHENERGAN) injection (IM or IVPB) 12.5 mg (08/08/21 0901)    Scheduled Medications:  amiodarone  200 mg Oral Daily   apixaban  5 mg Oral BID   Chlorhexidine Gluconate Cloth  6 each Topical Q0600   cinacalcet  60 mg Oral Q M,W,F-HD   collagenase   Topical Daily   darbepoetin (ARANESP) injection - DIALYSIS  25 mcg Intravenous Q Wed-HD   DULoxetine  20 mg Oral Daily   feeding supplement (NEPRO CARB STEADY)  237 mL Oral TID BM   insulin aspart  0-15 Units Subcutaneous TID WC   insulin aspart  0-5 Units Subcutaneous QHS   mouth rinse  15 mL Mouth Rinse BID   midodrine  15 mg Oral TID WC   multivitamin  1 tablet Oral QHS   pantoprazole  40 mg Oral BID   polyethylene glycol  17 g Oral Daily   senna  1 tablet Oral Daily    have reviewed scheduled and prn medications.  Physical Exam: General:NAD, looks comfortable, lying on bed Heart:RRR, s1s2 nl Lungs: Distant breath sound bilateral, Abdomen:soft, Non-tender, non-distended Extremities: Some venous stasis changes and has a dressing on. Dialysis Access: Left AV fistula has good thrill and bruit.  [name] 08/09/2021,11:39 AM  LOS: 49 days

## 2021-08-10 DIAGNOSIS — I50812 Chronic right heart failure: Secondary | ICD-10-CM | POA: Diagnosis not present

## 2021-08-10 DIAGNOSIS — R570 Cardiogenic shock: Secondary | ICD-10-CM | POA: Diagnosis not present

## 2021-08-10 DIAGNOSIS — L03115 Cellulitis of right lower limb: Secondary | ICD-10-CM | POA: Diagnosis not present

## 2021-08-10 DIAGNOSIS — I9589 Other hypotension: Secondary | ICD-10-CM | POA: Diagnosis not present

## 2021-08-10 LAB — RENAL FUNCTION PANEL
Albumin: 3.4 g/dL — ABNORMAL LOW (ref 3.5–5.0)
Anion gap: 15 (ref 5–15)
BUN: 52 mg/dL — ABNORMAL HIGH (ref 6–20)
CO2: 23 mmol/L (ref 22–32)
Calcium: 7.9 mg/dL — ABNORMAL LOW (ref 8.9–10.3)
Chloride: 93 mmol/L — ABNORMAL LOW (ref 98–111)
Creatinine, Ser: 6.44 mg/dL — ABNORMAL HIGH (ref 0.44–1.00)
GFR, Estimated: 7 mL/min — ABNORMAL LOW (ref >60)
Glucose, Bld: 104 mg/dL — ABNORMAL HIGH (ref 70–99)
Phosphorus: 3.6 mg/dL (ref 2.5–4.6)
Potassium: 4.1 mmol/L (ref 3.5–5.1)
Sodium: 131 mmol/L — ABNORMAL LOW (ref 135–145)

## 2021-08-10 LAB — CBC
HCT: 32.4 % — ABNORMAL LOW (ref 36.0–46.0)
Hemoglobin: 10.5 g/dL — ABNORMAL LOW (ref 12.0–15.0)
MCH: 29.9 pg (ref 26.0–34.0)
MCHC: 32.4 g/dL (ref 30.0–36.0)
MCV: 92.3 fL (ref 80.0–100.0)
Platelets: 245 10*3/uL (ref 150–400)
RBC: 3.51 MIL/uL — ABNORMAL LOW (ref 3.87–5.11)
RDW: 18.3 % — ABNORMAL HIGH (ref 11.5–15.5)
WBC: 8 10*3/uL (ref 4.0–10.5)
nRBC: 0 % (ref 0.0–0.2)

## 2021-08-10 LAB — GLUCOSE, CAPILLARY
Glucose-Capillary: 94 mg/dL (ref 70–99)
Glucose-Capillary: 95 mg/dL (ref 70–99)
Glucose-Capillary: 141 mg/dL — ABNORMAL HIGH (ref 70–99)
Glucose-Capillary: 125 mg/dL — ABNORMAL HIGH (ref 70–99)

## 2021-08-10 NOTE — Progress Notes (Signed)
Palliative:  Palliative has been following peripherally and checking in occasionally. [name] has remained consistent in her goals of care for full code and ongoing full scope treatment with hopes of ultimately improving her functional status to continue with outpatient dialysis. She has understanding of the severity of her illness and has told me that she has been close to death on many occasions but is hoping to have more time. She is very spiritual and relies heavily on her faith as well. At this time I will sign off. Please re-consult palliative care by placing order if there are changes in her wishes or condition and we will be happy to re-engage. For any questions or concerns call [phone] Thank you for this consult.   No changes  Vinie Sill, NP Palliative Medicine Team Pager [phone] (Please see [email or web address] for schedule) Team Phone [phone]

## 2021-08-10 NOTE — Progress Notes (Signed)
PROGRESS NOTE    [name]  WLS:[id number] DOB: 29-May-1966 DOA: 06/21/2021 PCP: Noreene Larsson, NP   Brief Narrative: [name] is a 55 y.o. female with a history of ESRD on HD, diet-controlled diabetes mellitus type 2, hypertension, heart failure, COPD, chronic Edema of left leg.  Patient presented secondary to right hip and leg pain with evidence of cellulitis.  She was started on empiric antibiotics but continued to have issues with hypotension with evidence of cardiogenic shock from biventricular failure and pulm hypertension.  She is managed with vasopressors in the ICU and nephrology was managing fluid via hemodialysis.  Blood pressure is better controlled and stable on midodrine. Discharge complicated by patient deconditioned state and requirement of tolerating HD in a chair.   Assessment & Plan:   Principal Problem:   Hypotension Active Problems:   Lymphedema of lower extremity   Morbid obesity (HCC)   Obesity hypoventilation syndrome (HCC)   Cellulitis   ESRD on dialysis ([place])   Staphylococcus epidermidis bacteremia   Elevated troponin   Chronic right heart failure (HCC)   Type 2 diabetes mellitus with hyperglycemia (HCC)   Lymphedema   Cardiogenic shock (HCC)   Bilateral calf pain   Pressure injury of skin   Pulmonary hypertension (HCC)   Cardiogenic shock Secondary to RV failure. Patient required Vasopressor support managed by heart failure team. Resolved. Midodrine started for blood pressure support.   Acute on chronic diastolic heart failure RV failure Managed with HD. EF of 55-60%.  Left medial thigh wound Prior CT without clear evidence of infection. Wound measures  5 x 11 cm. Concern for possible acute infectious process on 8/16 but wound is not highly suggestive of infection except for worsening smell of wound. No fevers, increased pain or leukocytosis. Wound care re-consulted with recommendations. Worsening pain of wound. Plastic surgery  consulted and will follow intermittently. -Zosyn x5 days for pain/foul smelling aspects of her wound  Infected RUE midline catheter Midline removed and patient treated with linezolid for 5 days.  Atypical atrial flutter Patient underwent DCCV on 7/15 with reversion to normal sinus rhythm. -Continue amiodarone, Eliquis  ESRD Nephrology consulted. AVF largely occluded. Patient undergoing HD. Plan for outpatient HD if she can tolerate upright HD in a chair.  Intractable nausea/vomiting Resolved.  Hyperkalemia Management with HD  Possible OSA/OHS Pulmonary hypertension Started on CPAP during hospitalization -Continue CPAP with recommendation for outpatient sleep study  Fibromyalgia -Continue Cymbalta  Anemia of chronic disease Hemoglobin stable.  Diabetes mellitus, type 2 Hemoglobin A1c of 6.9%. Not on medication management as an outpatient.  Started on insulin sliding scale inpatient. Well controlled. -Continue SSI  Morbid obesity Body mass index is 50.44 kg/m.  Weakness PT/OT recommending SNF.    Pressure injury Right/posterior/proximal thigh. Not present on admission.   DVT prophylaxis: Eliquis Code Status:   Code Status: Full Code Family Communication: None at bedside Disposition Plan: Discharge to SNF once able to tolerate outpatient HD. Patient will need to tolerate HD while seated. Unlikely to discharge for several days.   Consultants:  Nephrology Infectious disease Plastic surgery  Procedures:  HEMODIALYSIS  Antimicrobials: Linezolid Daptomycin Ceftriaxone Cefepime    Subjective: Thinks wound is improving. Pain is still present. No other concerns.  Objective: Vitals:   08/10/21 1000 08/10/21 1030 08/10/21 1045 08/10/21 1136  BP: 116/63 (!) 117/53 117/64 122/68  Pulse: 63 (!) 58 (!) 59 60  Resp:   18 20  Temp:   98 F (36.7 C)  98.4 F (36.9 C)  TempSrc:   Oral Oral  SpO2:   97%   Weight:   (!) 137.5 kg   Height:         Intake/Output Summary (Last 24 hours) at 08/10/2021 1204 Last data filed at 08/10/2021 1045 Gross per 24 hour  Intake 580.03 ml  Output 3000 ml  Net -2419.97 ml    Filed Weights   08/08/21 0413 08/10/21 0734 08/10/21 1045  Weight: 134.7 kg (!) 140.6 kg (!) 137.5 kg    Examination:  General exam: Appears calm and comfortable. In HD. Respiratory system: Clear to auscultation. Respiratory effort normal. Cardiovascular system: S1 & S2 heard, RRR. No murmurs, rubs, gallops or clicks. Gastrointestinal system: Abdomen is nondistended, soft and nontender. No organomegaly or masses felt. Normal bowel sounds heard. Central nervous system: Alert and oriented. No focal neurological deficits. Musculoskeletal: BLE edema with unna boots. Left thigh wound dressing is clean Skin: No cyanosis. No rashes Psychiatry: Judgement and insight appear normal. Mood & affect appropriate.    Data Reviewed: I have personally reviewed following labs and imaging studies  CBC Lab Results  Component Value Date   WBC 8.0 08/10/2021   RBC 3.51 (L) 08/10/2021   HGB 10.5 (L) 08/10/2021   HCT 32.4 (L) 08/10/2021   MCV 92.3 08/10/2021   MCH 29.9 08/10/2021   PLT 245 08/10/2021   MCHC 32.4 08/10/2021   RDW 18.3 (H) 08/10/2021   LYMPHSABS 1.3 06/22/2021   MONOABS 1.1 (H) 06/22/2021   EOSABS 0.0 06/22/2021   BASOSABS 0.0 71/69/6789     Last metabolic panel Lab Results  Component Value Date   NA 131 (L) 08/10/2021   K 4.1 08/10/2021   CL 93 (L) 08/10/2021   CO2 23 08/10/2021   BUN 52 (H) 08/10/2021   CREATININE 6.44 (H) 08/10/2021   GLUCOSE 104 (H) 08/10/2021   GFRNONAA 7 (L) 08/10/2021   GFRAA 5 (L) 07/04/2020   GFRAA 5 (L) 07/04/2020   CALCIUM 7.9 (L) 08/10/2021   PHOS 3.6 08/10/2021   PROT 8.0 07/19/2021   ALBUMIN 3.4 (L) 08/10/2021   BILITOT 1.0 07/19/2021   ALKPHOS 287 (H) 07/19/2021   AST 20 07/19/2021   ALT 15 07/19/2021   ANIONGAP 15 08/10/2021    CBG (last 3)  Recent Labs     08/09/21 2023 08/10/21 0517 08/10/21 1153  GLUCAP 118* 94 95      GFR: Estimated Creatinine Clearance: 13.9 mL/min (A) (by C-G formula based on SCr of 6.44 mg/dL (H)).  Coagulation Profile: No results for input(s): INR, PROTIME in the last 168 hours.  No results found for this or any previous visit (from the past 240 hour(s)).       Radiology Studies: No results found.      Scheduled Meds:  amiodarone  200 mg Oral Daily   apixaban  5 mg Oral BID   Chlorhexidine Gluconate Cloth  6 each Topical Q0600   cinacalcet  60 mg Oral Q M,W,F-HD   collagenase   Topical Daily   darbepoetin (ARANESP) injection - DIALYSIS  25 mcg Intravenous Q Wed-HD   DULoxetine  20 mg Oral Daily   feeding supplement (NEPRO CARB STEADY)  237 mL Oral TID BM   insulin aspart  0-15 Units Subcutaneous TID WC   insulin aspart  0-5 Units Subcutaneous QHS   mouth rinse  15 mL Mouth Rinse BID   midodrine  15 mg Oral TID WC   multivitamin  1 tablet Oral QHS  pantoprazole  40 mg Oral BID   polyethylene glycol  17 g Oral Daily   senna  1 tablet Oral Daily   Continuous Infusions:  sodium chloride     piperacillin-tazobactam (ZOSYN)  IV 2.25 g (08/10/21 0643)   promethazine (PHENERGAN) injection (IM or IVPB) 12.5 mg (08/08/21 0901)     LOS: 50 days     Cordelia Poche, MD [facility] 08/10/2021, 12:04 PM  If 7PM-7AM, please contact night-coverage [email or web address]

## 2021-08-10 NOTE — Progress Notes (Addendum)
Subjective:  Patient just returned from dialysis, resting comfortably.  Complains of chills, but denies any fevers.  She endorses ongoing pain in left thigh at wound site.  Most recent dressing change was this morning.  She mentions that her husband at home is disabled and her only caregiver would be a 55 year old granddaughter.  Objective: Vital signs in last 24 hours: Temp:  [97 F (36.1 C)-98.6 F (37 C)] 98.6 F (37 C) (08/22 1607) Pulse Rate:  [58-72] 72 (08/22 1607) Resp:  [15-20] 18 (08/22 1607) BP: (98-155)/(44-85) 122/67 (08/22 1607) SpO2:  [96 %-100 %] 97 % (08/22 1045) Weight:  [137.5 kg-140.6 kg] 137.5 kg (08/22 1045) Last BM Date: 08/08/21  Intake/Output from previous day: 08/21 0701 - 08/22 0700 In: 6 [P.O.:720; IV Piggyback:100] Out: -  Intake/Output this shift: Total I/O In: 240 [P.O.:240] Out: 3000 [Other:3000]  General appearance: alert Resp: Normal respiratory effort Extremities: edema bilaterally Neurologic: Grossly normal Incision/Wound:Similar to prior. Eschar noted. Malodorous. No crepitus.    Lab Results:  CBC Latest Ref Rng & Units 08/10/2021 08/06/2021 08/04/2021  WBC 4.0 - 10.5 K/uL 8.0 10.1 8.8  Hemoglobin 12.0 - 15.0 g/dL 10.5(L) 11.3(L) 11.0(L)  Hematocrit 36.0 - 46.0 % 32.4(L) 34.6(L) 34.2(L)  Platelets 150 - 400 K/uL 245 232 265    BMET Recent Labs    08/10/21 0752  NA 131*  K 4.1  CL 93*  CO2 23  GLUCOSE 104*  BUN 52*  CREATININE 6.44*  CALCIUM 7.9*   PT/INR No results for input(s): LABPROT, INR in the last 72 hours. ABG No results for input(s): PHART, HCO3 in the last 72 hours.  Invalid input(s): PCO2, PO2  Studies/Results: No results found.  Anti-infectives: Anti-infectives (From admission, onward)    Start     Dose/Rate Route Frequency Ordered Stop   08/06/21 1530  piperacillin-tazobactam (ZOSYN) IVPB 2.25 g        2.25 g 100 mL/hr over 30 Minutes Intravenous Every 8 hours 08/06/21 1434 08/10/21 2359   07/16/21  2000  DAPTOmycin (CUBICIN) 500 mg in sodium chloride 0.9 % IVPB  Status:  Discontinued        6 mg/kg  87.4 kg (Adjusted) 120 mL/hr over 30 Minutes Intravenous Every 48 hours 07/15/21 1138 07/16/21 1043   07/16/21 1200  linezolid (ZYVOX) tablet 600 mg        600 mg Oral Every 12 hours 07/16/21 1101 07/21/21 2018   07/15/21 1000  ceFEPIme (MAXIPIME) 1 g in sodium chloride 0.9 % 100 mL IVPB  Status:  Discontinued        1 g 200 mL/hr over 30 Minutes Intravenous Every 24 hours 07/15/21 0913 07/16/21 1043   07/15/21 0900  cefTRIAXone (ROCEPHIN) 1 g in sodium chloride 0.9 % 100 mL IVPB  Status:  Discontinued        1 g 200 mL/hr over 30 Minutes Intravenous Every 24 hours 07/15/21 0752 07/15/21 0913   07/15/21 0500  DAPTOmycin (CUBICIN) 500 mg in sodium chloride 0.9 % IVPB  Status:  Discontinued        6 mg/kg  87.4 kg (Adjusted) 120 mL/hr over 30 Minutes Intravenous Every 48 hours 07/15/21 0404 07/15/21 1138   06/23/21 0930  DAPTOmycin (CUBICIN) 500 mg in sodium chloride 0.9 % IVPB        6 mg/kg  86.2 kg (Adjusted) 120 mL/hr over 30 Minutes Intravenous Every 48 hours 06/23/21 0831 06/28/21 0130   06/23/21 0915  DAPTOmycin (CUBICIN) 800 mg in sodium chloride 0.9 %  IVPB  Status:  Discontinued        6 mg/kg  130.1 kg 132 mL/hr over 30 Minutes Intravenous Every 48 hours 06/23/21 0825 06/23/21 0831   06/22/21 2200  ceFEPIme (MAXIPIME) 1 g in sodium chloride 0.9 % 100 mL IVPB  Status:  Discontinued        1 g 200 mL/hr over 30 Minutes Intravenous Every 24 hours 06/22/21 0001 06/23/21 0825   06/22/21 0800  metroNIDAZOLE (FLAGYL) IVPB 500 mg  Status:  Discontinued        500 mg 100 mL/hr over 60 Minutes Intravenous Every 8 hours 06/22/21 0716 06/23/21 0825   06/21/21 2230  doxycycline (VIBRAMYCIN) 100 mg in sodium chloride 0.9 % 250 mL IVPB  Status:  Discontinued        100 mg 125 mL/hr over 120 Minutes Intravenous Every 12 hours 06/21/21 2223 06/23/21 0825   06/21/21 2215  ceFEPIme (MAXIPIME)  2 g in sodium chloride 0.9 % 100 mL IVPB        2 g 200 mL/hr over 30 Minutes Intravenous  Once 06/21/21 2208 06/21/21 2245   06/21/21 2215  metroNIDAZOLE (FLAGYL) IVPB 500 mg        500 mg 100 mL/hr over 60 Minutes Intravenous  Once 06/21/21 2208 06/21/21 2340       Assessment/Plan:  Left thigh wound.  Patient's wound on left thigh is painful and there is necrotic appearing eschar centrally.  We attempted to debride this at bedside, mildly limited due to patient's discomfort.  Discussed case with surgical team and will plan to take to the OR for debridement.  Continue with current management with Santyl in the interim.  Recommending daily wet-to-dry dressing changes.  No fevers or obvious cellulitic findings on physical exam, but her necrotic eschar is likely cause for her malodor and she would benefit from surgical debridement for optimal healing.  Plan for debridement of left thigh wound possible application of wound matrix +/- wound vac for Wednesday 08/19/21 at 14:30. NPO after midnight of prior.  Will need clearance from primary team.     LOS: 50 days    [name], PA-C 08/10/2021

## 2021-08-10 NOTE — Progress Notes (Signed)
[name] NEPHROLOGY PROGRESS NOTE  Assessment/ Plan:  # Acute exacerbation of chronic right ventricular heart failure with pulmonary hypertension/shock: Remains on midodrine and as needed albumin at hemodialysis for hypotension.  Continue hemodialysis while maintaining systolic blood pressure >79.  # ESRD, MWF: Outpt [facility].  On MWF schedule here.  LFA AVF.    Encouraged to try and prolong her duration of sitting in a recliner so that she can be in a better position for discharge home/to SNF for outpatient hemodialysis versus LTAC for continued physical rehabilitation.   # Anemia: hb at goal, continue ESA with surveillance of hemoglobin/hematocrit.  # CKD-MBD: Continue Sensipar for PTH control.  Calcium and phosphorus levels currently at acceptable levels, off of calcium acetate.  # Nutrition: Continue renal diet with oral nutritional supplementation.  # Hypotension/volumes: Blood pressures currently at goal, monitor with hemodialysis/ultrafiltration.  She uses midodrine  Subjective:  Seen on HD: LFA AVF, 2K bath, 3L UF goal No c/o or needs Hb 10.5, K 4.1, P 3.6.  AF, VSS  Objective Vital signs in last 24 hours: Vitals:   08/10/21 0734 08/10/21 0744 08/10/21 0800 08/10/21 0830  BP: (!) 155/79 (!) 104/57 (!) 115/54 (!) 107/57  Pulse: 60 60 61 63  Resp: 15     Temp:      TempSrc:      SpO2: 96%     Weight: (!) 140.6 kg     Height:       Weight change:   Intake/Output Summary (Last 24 hours) at 08/10/2021 0836 Last data filed at 08/09/2021 2105 Gross per 24 hour  Intake 820.03 ml  Output --  Net 820.03 ml        Labs: Basic Metabolic Panel: Recent Labs  Lab 08/05/21 0141 08/07/21 0211 08/10/21 0752  NA 131* 132* 131*  K 4.0 4.0 4.1  CL 95* 93* 93*  CO2 _0 GLUCOSE 138* 95 104*  BUN 38* 37* 52*  CREATININE 5.78* 5.57* 6.44*  CALCIUM 7.8* 8.3* 7.9*  PHOS 2.6  --  3.6    Liver Function Tests: Recent Labs  Lab  08/05/21 0141 08/10/21 0752  ALBUMIN 3.7 3.4*    No results for input(s): LIPASE, AMYLASE in the last 168 hours. No results for input(s): AMMONIA in the last 168 hours. CBC: Recent Labs  Lab 08/04/21 0043 08/06/21 1344 08/10/21 0752  WBC 8.8 10.1 8.0  HGB 11.0* 11.3* 10.5*  HCT 34.2* 34.6* 32.4*  MCV 93.7 93.5 92.3  PLT 265 232 245    Cardiac Enzymes: No results for input(s): CKTOTAL, CKMB, CKMBINDEX, TROPONINI in the last 168 hours. CBG: Recent Labs  Lab 08/09/21 0530 08/09/21 1215 08/09/21 1616 08/09/21 2023 08/10/21 0517  GLUCAP 105* 217* 116* 118* 94     Iron Studies: No results for input(s): IRON, TIBC, TRANSFERRIN, FERRITIN in the last 72 hours. Studies/Results: No results found.  Medications: Infusions:  sodium chloride     piperacillin-tazobactam (ZOSYN)  IV 2.25 g (08/10/21 0643)   promethazine (PHENERGAN) injection (IM or IVPB) 12.5 mg (08/08/21 0901)    Scheduled Medications:  amiodarone  200 mg Oral Daily   apixaban  5 mg Oral BID   Chlorhexidine Gluconate Cloth  6 each Topical Q0600   cinacalcet  60 mg Oral Q M,W,F-HD   collagenase   Topical Daily   darbepoetin (ARANESP) injection - DIALYSIS  25 mcg Intravenous Q Wed-HD   DULoxetine  20 mg Oral Daily   feeding supplement (NEPRO CARB  STEADY)  237 mL Oral TID BM   insulin aspart  0-15 Units Subcutaneous TID WC   insulin aspart  0-5 Units Subcutaneous QHS   mouth rinse  15 mL Mouth Rinse BID   midodrine  15 mg Oral TID WC   multivitamin  1 tablet Oral QHS   pantoprazole  40 mg Oral BID   polyethylene glycol  17 g Oral Daily   senna  1 tablet Oral Daily    have reviewed scheduled and prn medications.  Physical Exam: General:NAD, looks comfortable, lying on bed Heart:RRR, s1s2 nl Lungs: Distant breath sound bilateral, Abdomen:soft, Non-tender, non-distended Extremities: Some venous stasis changes and has a dressing on. Dialysis Access: Left AV fistula has good thrill and bruit.  [name] B  [name] 08/10/2021,8:36 AM  LOS: 50 days

## 2021-08-10 NOTE — Progress Notes (Signed)
Mobility Specialist: Progress Note   08/10/21 1834  Mobility  Activity  (Tilted in Bed)  Level of Assistance Independent after set-up  Assistive Device None  Mobility Sit up in bed/chair position for meals  Mobility Response Tolerated well  Mobility performed by Mobility specialist  $Mobility charge 1 Mobility   Pt tilted to 40 degrees and attempted to march in place, pt unable. Tilted pt back to 35 degrees and pt was able to march in place x5 each leg for 2 sets. Pt tolerated well with no c/o. Pt back in normal bed position with call bell and phone in her lap.   [facility] Lenell Mcconnell Mobility Specialist Mobility Specialist Phone: [phone]

## 2021-08-10 NOTE — Progress Notes (Addendum)
PT Cancellation Note  Patient Details Name: [name] MRN: [id number] DOB: [birth year]   Cancelled Treatment:    Reason Eval/Treat Not Completed: (P) Pain limiting ability to participate (pt had HD in AM. Attempted again at 2:10pm, pt defer EOB/OOB mobility due to pain and wants to use bed pan, NT notified. Per RN, plan to transfer pt off unit soon so also defer hoyer lift to chair.)   Kara Pacer Shneur [place] 08/10/2021, 3:07 PM

## 2021-08-10 NOTE — Plan of Care (Signed)

## 2021-08-11 DIAGNOSIS — I50812 Chronic right heart failure: Secondary | ICD-10-CM | POA: Diagnosis not present

## 2021-08-11 DIAGNOSIS — L03115 Cellulitis of right lower limb: Secondary | ICD-10-CM | POA: Diagnosis not present

## 2021-08-11 DIAGNOSIS — R570 Cardiogenic shock: Secondary | ICD-10-CM | POA: Diagnosis not present

## 2021-08-11 DIAGNOSIS — I9589 Other hypotension: Secondary | ICD-10-CM | POA: Diagnosis not present

## 2021-08-11 LAB — GLUCOSE, CAPILLARY
Glucose-Capillary: 105 mg/dL — ABNORMAL HIGH (ref 70–99)
Glucose-Capillary: 150 mg/dL — ABNORMAL HIGH (ref 70–99)
Glucose-Capillary: 124 mg/dL — ABNORMAL HIGH (ref 70–99)
Glucose-Capillary: 134 mg/dL — ABNORMAL HIGH (ref 70–99)

## 2021-08-11 NOTE — Progress Notes (Signed)
Orthopedic Tech Progress Note Patient Details:  [name] Apr 15, 1966 [id number]  Ortho Devices Type of Ortho Device: Haematologist Ortho Device/Splint Location: BLE Ortho Device/Splint Interventions: Ordered, Application   Post Interventions Patient Tolerated: Well Instructions Provided: Care of [place] 08/11/2021, 7:06 PM

## 2021-08-11 NOTE — Progress Notes (Signed)
PROGRESS NOTE    [name]  WOE:[id number] DOB: 26-Sep-1966 DOA: 06/21/2021 PCP: Noreene Larsson, NP   Brief Narrative: [name] is a 55 y.o. female with a history of ESRD on HD, diet-controlled diabetes mellitus type 2, hypertension, heart failure, COPD, chronic Edema of left leg.  Patient presented secondary to right hip and leg pain with evidence of cellulitis.  She was started on empiric antibiotics but continued to have issues with hypotension with evidence of cardiogenic shock from biventricular failure and pulm hypertension.  She is managed with vasopressors in the ICU and nephrology was managing fluid via hemodialysis.  Blood pressure is better controlled and stable on midodrine. Discharge complicated by patient deconditioned state and requirement of tolerating HD in a chair.   Assessment & Plan:   Principal Problem:   Hypotension Active Problems:   Lymphedema of lower extremity   Morbid obesity (HCC)   Obesity hypoventilation syndrome (HCC)   Cellulitis   ESRD on dialysis ([place])   Staphylococcus epidermidis bacteremia   Elevated troponin   Chronic right heart failure (HCC)   Type 2 diabetes mellitus with hyperglycemia (HCC)   Lymphedema   Cardiogenic shock (HCC)   Bilateral calf pain   Pressure injury of skin   Pulmonary hypertension (HCC)   Cardiogenic shock Secondary to RV failure. Patient required Vasopressor support managed by heart failure team. Resolved. Midodrine started for blood pressure support.   Acute on chronic diastolic heart failure RV failure Managed with HD. EF of 55-60%. Stable.  Left medial thigh wound Prior CT without clear evidence of infection. Wound measures  5 x 11 cm. Concern for possible acute infectious process on 8/16 but wound is not highly suggestive of infection except for worsening smell of wound. No fevers, increased pain or leukocytosis. Wound care re-consulted with recommendations. Plastic surgery consulted and will  follow intermittently. Treated with Zosyn x5 days for pain/foul smelling aspects of her wound. There is improvement. -Plastic surgery: plan for surgery (wound debridement) on 8/31. Patient currently optimized as much as possible for surgery with no acute exacerbation.  Infected RUE midline catheter Midline removed and patient treated with linezolid for 5 days. Resolved.  Atypical atrial flutter Patient underwent DCCV on 7/15 with reversion to normal sinus rhythm. -Continue amiodarone, Eliquis  ESRD Nephrology consulted. AVF largely occluded. Patient undergoing HD. Plan for outpatient HD if she can tolerate upright HD in a chair.  Intractable nausea/vomiting Resolved.  Hyperkalemia Management with HD  Possible OSA/OHS Pulmonary hypertension Started on CPAP during hospitalization -Continue CPAP with recommendation for outpatient sleep study  Fibromyalgia -Continue Cymbalta  Anemia of chronic disease Hemoglobin stable.  Diabetes mellitus, type 2 Hemoglobin A1c of 6.9%. Not on medication management as an outpatient.  Started on insulin sliding scale inpatient. Well controlled. -Continue SSI  Morbid obesity Body mass index is 50.44 kg/m.  Weakness PT/OT recommending SNF.    Pressure injury Right/posterior/proximal thigh. Not present on admission.   DVT prophylaxis: Eliquis Code Status:   Code Status: Full Code Family Communication: None at bedside Disposition Plan: Discharge to SNF once able to tolerate outpatient HD. Patient will need to tolerate HD while seated. Unlikely to discharge for several days.   Consultants:  Nephrology Infectious disease Plastic surgery  Procedures:  HEMODIALYSIS  Antimicrobials: Linezolid Daptomycin Ceftriaxone Cefepime    Subjective: Feeling better about her wound. Pain is slightly improved. Working with PT. Unable to transfer but is tolerating a chair more consistently  Objective: Vitals:   08/10/21  1607 08/10/21 2023  08/11/21 0000 08/11/21 0528  BP: 122/67 118/72  122/68  Pulse: 72 65 68 60  Resp: _0 Temp: 98.6 F (37 C) 98.1 F (36.7 C)  97.7 F (36.5 C)  TempSrc: Oral   Oral  SpO2:  93% 94% 96%  Weight:      Height:        Intake/Output Summary (Last 24 hours) at 08/11/2021 1310 Last data filed at 08/11/2021 0600 Gross per 24 hour  Intake 240 ml  Output --  Net 240 ml    Filed Weights   08/08/21 0413 08/10/21 0734 08/10/21 1045  Weight: 134.7 kg (!) 140.6 kg (!) 137.5 kg    Examination:  General exam: Appears calm and comfortable Respiratory system: Clear to auscultation. Respiratory effort normal. Cardiovascular system: S1 & S2 heard, RRR. No murmurs, rubs, gallops or clicks. Gastrointestinal system: Obese. Abdomen is nondistended, soft and nontender. No organomegaly or masses felt. Normal bowel sounds heard. Central nervous system: Alert and oriented. No focal neurological deficits. Musculoskeletal: BLE edema with unna boots. Left medial thigh wound with clean dressing Skin: No cyanosis. No rashes Psychiatry: Judgement and insight appear normal. Mood & affect appropriate.    Data Reviewed: I have personally reviewed following labs and imaging studies  CBC Lab Results  Component Value Date   WBC 8.0 08/10/2021   RBC 3.51 (L) 08/10/2021   HGB 10.5 (L) 08/10/2021   HCT 32.4 (L) 08/10/2021   MCV 92.3 08/10/2021   MCH 29.9 08/10/2021   PLT 245 08/10/2021   MCHC 32.4 08/10/2021   RDW 18.3 (H) 08/10/2021   LYMPHSABS 1.3 06/22/2021   MONOABS 1.1 (H) 06/22/2021   EOSABS 0.0 06/22/2021   BASOSABS 0.0 76/28/3151     Last metabolic panel Lab Results  Component Value Date   NA 131 (L) 08/10/2021   K 4.1 08/10/2021   CL 93 (L) 08/10/2021   CO2 23 08/10/2021   BUN 52 (H) 08/10/2021   CREATININE 6.44 (H) 08/10/2021   GLUCOSE 104 (H) 08/10/2021   GFRNONAA 7 (L) 08/10/2021   GFRAA 5 (L) 07/04/2020   GFRAA 5 (L) 07/04/2020   CALCIUM 7.9 (L) 08/10/2021   PHOS 3.6  08/10/2021   PROT 8.0 07/19/2021   ALBUMIN 3.4 (L) 08/10/2021   BILITOT 1.0 07/19/2021   ALKPHOS 287 (H) 07/19/2021   AST 20 07/19/2021   ALT 15 07/19/2021   ANIONGAP 15 08/10/2021    CBG (last 3)  Recent Labs    08/10/21 2015 08/11/21 0638 08/11/21 1210  GLUCAP 125* 105* 150*      GFR: Estimated Creatinine Clearance: 13.9 mL/min (A) (by C-G formula based on SCr of 6.44 mg/dL (H)).  Coagulation Profile: No results for input(s): INR, PROTIME in the last 168 hours.  No results found for this or any previous visit (from the past 240 hour(s)).       Radiology Studies: No results found.      Scheduled Meds:  amiodarone  200 mg Oral Daily   apixaban  5 mg Oral BID   Chlorhexidine Gluconate Cloth  6 each Topical Q0600   cinacalcet  60 mg Oral Q M,W,F-HD   collagenase   Topical Daily   darbepoetin (ARANESP) injection - DIALYSIS  25 mcg Intravenous Q Wed-HD   DULoxetine  20 mg Oral Daily   feeding supplement (NEPRO CARB STEADY)  237 mL Oral TID BM   insulin aspart  0-15 Units Subcutaneous TID WC   insulin  aspart  0-5 Units Subcutaneous QHS   mouth rinse  15 mL Mouth Rinse BID   midodrine  15 mg Oral TID WC   multivitamin  1 tablet Oral QHS   pantoprazole  40 mg Oral BID   polyethylene glycol  17 g Oral Daily   senna  1 tablet Oral Daily   Continuous Infusions:  sodium chloride     promethazine (PHENERGAN) injection (IM or IVPB) 12.5 mg (08/08/21 0901)     LOS: 51 days     Cordelia Poche, MD [facility] 08/11/2021, 1:10 PM  If 7PM-7AM, please contact night-coverage [email or web address]

## 2021-08-11 NOTE — Plan of Care (Signed)
  Problem: Activity: Goal: Risk for activity intolerance will decrease Outcome: Progressing   Problem: Pain Managment: Goal: General experience of comfort will improve Outcome: Progressing

## 2021-08-11 NOTE — Progress Notes (Signed)
[facility] NEPHROLOGY PROGRESS NOTE  Assessment/ Plan:  # Acute exacerbation of chronic right ventricular heart failure with pulmonary hypertension/shock: Remains on midodrine and as needed albumin at hemodialysis for hypotension.  Continue hemodialysis while maintaining systolic blood pressure >11.  # ESRD, MWF: Outpt [facility].  On schedule here.  LFA AVF.    Encouraged to try and prolong her duration of sitting in a recliner  #Prolonged bleeding/oozing from AV fistula: Some swelling as well.  Plan for fistulogram with IR.  Likely exacerbated by use of apixaban.  # Anemia: hb at goal, continue ESA with surveillance of hemoglobin/hematocrit.  # CKD-MBD: Continue Sensipar for PTH control.  Calcium and phosphorus levels currently at acceptable levels, off of calcium acetate.  # Nutrition: Continue renal diet with oral nutritional supplementation.  # Hypotension/volumes: Blood pressures currently at goal, monitor with hemodialysis/ultrafiltration.  She uses midodrine  Subjective:  Tolerated dialysis well yesterday Tells me that her fistula has been oozing for the past few weeks after dialysis, some swelling as well No other complaints at the current time  Objective Vital signs in last 24 hours: Vitals:   08/10/21 1607 08/10/21 2023 08/11/21 0000 08/11/21 0528  BP: 122/67 118/72  122/68  Pulse: 72 65 68 60  Resp: _0 Temp: 98.6 F (37 C) 98.1 F (36.7 C)  97.7 F (36.5 C)  TempSrc: Oral   Oral  SpO2:  93% 94% 96%  Weight:      Height:       Weight change:   Intake/Output Summary (Last 24 hours) at 08/11/2021 1522 Last data filed at 08/11/2021 0600 Gross per 24 hour  Intake 240 ml  Output --  Net 240 ml        Labs: Basic Metabolic Panel: Recent Labs  Lab 08/05/21 0141 08/07/21 0211 08/10/21 0752  NA 131* 132* 131*  K 4.0 4.0 4.1  CL 95* 93* 93*  CO2 _1 GLUCOSE 138* 95 104*  BUN 38* 37* 52*  CREATININE 5.78* 5.57*  6.44*  CALCIUM 7.8* 8.3* 7.9*  PHOS 2.6  --  3.6    Liver Function Tests: Recent Labs  Lab 08/05/21 0141 08/10/21 0752  ALBUMIN 3.7 3.4*    No results for input(s): LIPASE, AMYLASE in the last 168 hours. No results for input(s): AMMONIA in the last 168 hours. CBC: Recent Labs  Lab 08/06/21 1344 08/10/21 0752  WBC 10.1 8.0  HGB 11.3* 10.5*  HCT 34.6* 32.4*  MCV 93.5 92.3  PLT 232 245    Cardiac Enzymes: No results for input(s): CKTOTAL, CKMB, CKMBINDEX, TROPONINI in the last 168 hours. CBG: Recent Labs  Lab 08/10/21 1153 08/10/21 1609 08/10/21 2015 08/11/21 0638 08/11/21 1210  GLUCAP 95 141* 125* 105* 150*     Iron Studies: No results for input(s): IRON, TIBC, TRANSFERRIN, FERRITIN in the last 72 hours. Studies/Results: No results found.  Medications: Infusions:  sodium chloride     promethazine (PHENERGAN) injection (IM or IVPB) 12.5 mg (08/08/21 0901)    Scheduled Medications:  amiodarone  200 mg Oral Daily   apixaban  5 mg Oral BID   Chlorhexidine Gluconate Cloth  6 each Topical Q0600   cinacalcet  60 mg Oral Q M,W,F-HD   collagenase   Topical Daily   darbepoetin (ARANESP) injection - DIALYSIS  25 mcg Intravenous Q Wed-HD   DULoxetine  20 mg Oral Daily   feeding supplement (NEPRO CARB STEADY)  237 mL Oral TID BM  insulin aspart  0-15 Units Subcutaneous TID WC   insulin aspart  0-5 Units Subcutaneous QHS   mouth rinse  15 mL Mouth Rinse BID   midodrine  15 mg Oral TID WC   multivitamin  1 tablet Oral QHS   pantoprazole  40 mg Oral BID   polyethylene glycol  17 g Oral Daily   senna  1 tablet Oral Daily    have reviewed scheduled and prn medications.  Physical Exam: General:NAD, looks comfortable, lying on bed Heart:RRR, s1s2 nl Lungs: Distant breath sound bilateral, Abdomen:soft, Non-tender, non-distended Extremities: Some venous stasis changes and has a dressing on. Dialysis Access: Left AV fistula has good thrill and bruit.  Rexene Agent 08/11/2021,3:22 PM  LOS: 51 days

## 2021-08-11 NOTE — Progress Notes (Signed)
Physical Therapy Treatment Patient Details Name: [name] MRN: [id number] DOB: 1966/03/03 Today's Date: 08/11/2021    History of Present Illness 55 y.o. female presenting from [place] ED 7/3 with lethargy, hypotension and elevated lactic acid after d/c from [facility] earlier that day with septic shock and cellulitis. Patient found to have AMS, septic shock, acute respiratory failure, acute on chronic RV failure. Transferred to ICU on 7/12 due to hypotension. PMHX significant for obesity, DMII, COPD, CAD, lymphedema, ESRD on HD MWF and CHF.    PT Comments    Patient progressing slowly but tolerated session today after pre-med better.  Still pain limited with weight bearing.  Patient well motivated and doing lots of exercises while tilted up in Kreg bed at 40 degrees.  Feel she needs further progression for OOB to tolerate outpatient dialysis prior to d/c home or SNF.  If she does go home will need hoyer lift.  Follow Up Recommendations  SNF     Equipment Recommendations  Hospital bed;Other (comment) (hoyer lift)    Recommendations for Other Services       Precautions / Restrictions Precautions Precautions: Fall Precaution Comments: bil LE edema, L thigh wounds Restrictions Weight Bearing Restrictions: No    Mobility  Bed Mobility Overal bed mobility: Needs Assistance             General bed mobility comments: mod +2 to scoot up in the bed with use of bed pad, Pt able to grab bed frame to do multiple scoots prior to therapy assist    Transfers Overall transfer level: Needs assistance Equipment used:  (Kreg Bed)             General transfer comment: TOlerated 40 degreed standing in [place] Bed for approx 15 min  Ambulation/Gait                 Stairs             Wheelchair Mobility    Modified Rankin (Stroke Patients Only)       Balance Overall balance assessment: Needs assistance         Standing balance support: Bilateral upper extremity  supported;During functional activity Standing balance-Leahy Scale: Zero Standing balance comment: tilted to 40 degrees and with hip strap on pt able to move forward from supine arms crossed over chest                            Cognition Arousal/Alertness: Awake/alert Behavior During Therapy: WFL for tasks assessed/performed Overall Cognitive Status: Within Functional Limits for tasks assessed                                 General Comments: very motivated      Exercises General Exercises - Upper Extremity Shoulder Flexion: Strengthening;Both;10 reps;Theraband;Standing (40 degrees in tilt bed) Theraband Level (Shoulder Flexion): Other (comment) (level 2 orange) Shoulder Horizontal ADduction: Strengthening;Both;10 reps;Theraband;Standing (40 degrees in tilt bed) Theraband Level (Shoulder Horizontal Adduction): Other (comment) (level 2 orange) Elbow Flexion: Strengthening;Both;10 reps;Standing;Theraband (40 degrees in tilt bed) Theraband Level (Elbow Flexion): Other (comment) (level 2 orange) General Exercises - Lower Extremity Straight Leg Raises: Strengthening;Supine;Both;AROM;10 reps Hip Flexion/Marching: Strengthening;Both;5 reps;Standing (tilted at 35 degrees) Other Exercises Other Exercises: B hip IR x 10 w/ 5 sec hold Other Exercises: chest press with orange t-band x 10    General Comments General comments (skin integrity, edema,  etc.): BP WFL throughout with tilt to 40 degrees      Pertinent Vitals/Pain Pain Assessment: 0-10 Pain Score: 6  Pain Location: L thigh wound, increases with WB Pain Descriptors / Indicators: Discomfort;Grimacing;Sore Pain Intervention(s): Monitored during session;Repositioned;Premedicated before session    Home Living                      Prior Function            PT Goals (current goals can now be found in the care plan section) Acute Rehab PT Goals Patient Stated Goal: get stronger and return  home Progress towards PT goals: Progressing toward goals    Frequency    Min 2X/week      PT Plan Current plan remains appropriate    Co-evaluation PT/OT/SLP Co-Evaluation/Treatment: Yes Reason for Co-Treatment: For patient/therapist safety;To address functional/ADL transfers PT goals addressed during session: Mobility/safety with mobility;Strengthening/ROM OT goals addressed during session: Strengthening/ROM      AM-PAC PT "6 Clicks" Mobility   Outcome Measure  Help needed turning from your back to your side while in a flat bed without using bedrails?: A Lot Help needed moving from lying on your back to sitting on the side of a flat bed without using bedrails?: A Lot Help needed moving to and from a bed to a chair (including a wheelchair)?: Total Help needed standing up from a chair using your arms (e.g., wheelchair or bedside chair)?: Total Help needed to walk in hospital room?: Total Help needed climbing 3-5 steps with a railing? : Total 6 Click Score: 8    End of Session   Activity Tolerance: Patient tolerated treatment well;Patient limited by pain Patient left: in bed;with call bell/phone within reach   PT Visit Diagnosis: Other abnormalities of gait and mobility (R26.89);Muscle weakness (generalized) (M62.81);Pain Pain - Right/Left: Left Pain - part of body: Leg     Time: 0930-1000 PT Time Calculation (min) (ACUTE ONLY): 30 min  Charges:  $Therapeutic Activity: 8-22 mins                     Magda Kiel, PT Acute Rehabilitation Services BXIDH:[phone] Office:[phone] 08/11/2021    Reginia Naas 08/11/2021, 11:16 AM

## 2021-08-11 NOTE — Plan of Care (Signed)

## 2021-08-11 NOTE — Progress Notes (Addendum)
Occupational Therapy Treatment Patient Details Name: [name] MRN: [id number] DOB: 1966-10-17 Today's Date: 08/11/2021    History of present illness 55 y.o. female presenting [facility] 7[facility] with lethargy, hypotension and elevated lactic acid after d/c from [facility] earlier that day with septic shock and cellulitis. Patient found to have AMS, septic shock, acute respiratory failure, acute on chronic RV failure. Transferred to ICU on 7/12 due to hypotension. PMHX significant for obesity, DMII, COPD, CAD, lymphedema, ESRD on HD MWF and CHF.   OT comments  Pt very eager and willing to participate in therapy - very motivated. Pt BP WFL throughout session (see vital flow sheets). Kreg bed used to facilitate WB and strengthening/activity tolerance for ADL. Pt participated in various leg exercises and UB theraband exercises at tilted position (40 degrees) for aprox 15 min. Educated Pt on AE for LB ADL (Pt familiar and has grabber/reacher as well as long handle sponge) Pt continues to benefit from skilled OT in the acute setting and is hopeful to get to a level to dc home to her 65 y/o granddaughter and husband (who is in a [place] and disabled himself) PTA she was walking 3-4 steps with SPC to gain access to bathroom (other DME does not fit). At this time current recommendations are appropriate. Next session to focus on OOB transfers (coming EOB and working on sit<>stand) for potential home.    Follow Up Recommendations  SNF;LTACH;Supervision/Assistance - 24 hour    Equipment Recommendations  Hospital bed;Other (comment);3 in 1 bedside commode (hoyer lift, WIDE)    Recommendations for Other Services      Precautions / Restrictions Precautions Precautions: Fall Precaution Comments: bil LE edema, L thigh wounds Restrictions Weight Bearing Restrictions: No       Mobility Bed Mobility Overal bed mobility: Needs Assistance             General bed mobility comments: mod +2 to scoot up in the bed  with use of bed pad, Pt able to grab bed frame to do multiple scoots prior to therapy assist    Transfers Overall transfer level: Needs assistance Equipment used:  (Kreg Bed)             General transfer comment: TOlerated 40 degreed standing in [place] Bed for approx 15 min    Balance Overall balance assessment: Needs assistance         Standing balance support: Bilateral upper extremity supported;During functional activity Standing balance-Leahy Scale: Poor Standing balance comment: reliant on UE support and support in Kreg bed                           ADL either performed or assessed with clinical judgement   ADL Overall ADL's : Needs assistance/impaired     Grooming: Set up;Standing;Oral care;Wash/dry face;Wash/dry hands (tilted at 40 degrees) Grooming Details (indicate cue type and reason): in tilted position 40 degrees             Lower Body Dressing: Total assistance;+2 for physical assistance;+2 for safety/equipment;Sit to/from stand Lower Body Dressing Details (indicate cue type and reason): Pt typically has assist with socks from husband, uses grabber/reacher, and long handle sponge               General ADL Comments: focused on tolerance with WB utilizing Kreg bed     Vision       Perception     Praxis      Cognition  Arousal/Alertness: Awake/alert Behavior During Therapy: WFL for tasks assessed/performed Overall Cognitive Status: Within Functional Limits for tasks assessed                                 General Comments: very motivated        Exercises     Shoulder Instructions       General Comments BP WFL throughout session, see vital flow sheets    Pertinent Vitals/ Pain       Pain Assessment: 0-10 Pain Score: 6  Pain Location: L thigh wound, increases with WB Pain Descriptors / Indicators: Discomfort;Grimacing;Sore Pain Intervention(s): Monitored during session;Premedicated before  session;Repositioned  Home Living                                          Prior Functioning/Environment              Frequency  Min 2X/week        Progress Toward Goals  OT Goals(current goals can now be found in the care plan section)  Progress towards OT goals: Progressing toward goals  Acute Rehab OT Goals Patient Stated Goal: get stronger and return home OT Goal Formulation: With patient Time For Goal Achievement: 08/20/21 Potential to Achieve Goals: Good  Plan Discharge plan remains appropriate;Frequency remains appropriate    Co-evaluation    PT/OT/SLP Co-Evaluation/Treatment: Yes Reason for Co-Treatment: For patient/therapist safety;To address functional/ADL transfers PT goals addressed during session: Strengthening/ROM OT goals addressed during session: Strengthening/ROM      AM-PAC OT "6 Clicks" Daily Activity     Outcome Measure   Help from another person eating meals?: None Help from another person taking care of personal grooming?: A Little Help from another person toileting, which includes using toliet, bedpan, or urinal?: Total Help from another person bathing (including washing, rinsing, drying)?: A Lot Help from another person to put on and taking off regular upper body clothing?: A Little Help from another person to put on and taking off regular lower body clothing?: Total 6 Click Score: 14    End of Session Equipment Utilized During Treatment: Other (comment) (Kreg Bed)  OT Visit Diagnosis: Other abnormalities of gait and mobility (R26.89);Muscle weakness (generalized) (M62.81);Pain Pain - Right/Left: Left Pain - part of body: Leg   Activity Tolerance Patient tolerated treatment well   Patient Left in bed;with call bell/phone within reach;with nursing/sitter in room   Nurse Communication Mobility status;Precautions        Time: 0931-1216 OT Time Calculation (min): 26 min  Charges: OT General Charges $OT Visit:  1 Visit OT Treatments $Self Care/Home Management : 8-22 mins  [name] OTR/L Acute Rehabilitation Services Pager: [phone] Office: [place] 08/11/2021, 10:40 AM

## 2021-08-11 NOTE — Progress Notes (Signed)
Received transfer patient from 4E via bed. Patient is alert and oriented x4 and NAD. Oriented to room and staff. Assessment done. needs met. Call bell and room phone placed within reach.

## 2021-08-12 ENCOUNTER — Inpatient Hospital Stay (HOSPITAL_COMMUNITY): Payer: 59

## 2021-08-12 DIAGNOSIS — T82590A Other mechanical complication of surgically created arteriovenous fistula, initial encounter: Secondary | ICD-10-CM

## 2021-08-12 DIAGNOSIS — I9589 Other hypotension: Secondary | ICD-10-CM | POA: Diagnosis not present

## 2021-08-12 LAB — BASIC METABOLIC PANEL
Anion gap: 13 (ref 5–15)
BUN: 49 mg/dL — ABNORMAL HIGH (ref 6–20)
CO2: 24 mmol/L (ref 22–32)
Calcium: 7.3 mg/dL — ABNORMAL LOW (ref 8.9–10.3)
Chloride: 95 mmol/L — ABNORMAL LOW (ref 98–111)
Creatinine, Ser: 6.35 mg/dL — ABNORMAL HIGH (ref 0.44–1.00)
GFR, Estimated: 7 mL/min — ABNORMAL LOW (ref >60)
Glucose, Bld: 147 mg/dL — ABNORMAL HIGH (ref 70–99)
Potassium: 3.5 mmol/L (ref 3.5–5.1)
Sodium: 132 mmol/L — ABNORMAL LOW (ref 135–145)

## 2021-08-12 LAB — CBC WITH DIFFERENTIAL/PLATELET
Abs Immature Granulocytes: 0.04 10*3/uL (ref 0.00–0.07)
Basophils Absolute: 0 10*3/uL (ref 0.0–0.1)
Basophils Relative: 1 %
Eosinophils Absolute: 0.1 10*3/uL (ref 0.0–0.5)
Eosinophils Relative: 2 %
HCT: 33.5 % — ABNORMAL LOW (ref 36.0–46.0)
Hemoglobin: 10.7 g/dL — ABNORMAL LOW (ref 12.0–15.0)
Immature Granulocytes: 1 %
Lymphocytes Relative: 20 %
Lymphs Abs: 1.3 10*3/uL (ref 0.7–4.0)
MCH: 29.4 pg (ref 26.0–34.0)
MCHC: 31.9 g/dL (ref 30.0–36.0)
MCV: 92 fL (ref 80.0–100.0)
Monocytes Absolute: 0.5 10*3/uL (ref 0.1–1.0)
Monocytes Relative: 9 %
Neutro Abs: 4.3 10*3/uL (ref 1.7–7.7)
Neutrophils Relative %: 67 %
Platelets: 218 10*3/uL (ref 150–400)
RBC: 3.64 MIL/uL — ABNORMAL LOW (ref 3.87–5.11)
RDW: 17.9 % — ABNORMAL HIGH (ref 11.5–15.5)
WBC: 6.4 10*3/uL (ref 4.0–10.5)
nRBC: 0 % (ref 0.0–0.2)

## 2021-08-12 LAB — GLUCOSE, CAPILLARY
Glucose-Capillary: 120 mg/dL — ABNORMAL HIGH (ref 70–99)
Glucose-Capillary: 113 mg/dL — ABNORMAL HIGH (ref 70–99)
Glucose-Capillary: 92 mg/dL (ref 70–99)
Glucose-Capillary: 90 mg/dL (ref 70–99)

## 2021-08-12 MED ORDER — PENTAFLUOROPROP-TETRAFLUOROETH EX AERO: 1.0000 "application " | INHALATION_SPRAY | CUTANEOUS | Status: DC | PRN

## 2021-08-12 MED ORDER — MIDODRINE HCL 5 MG PO TABS
ORAL_TABLET | ORAL | Status: AC
  Filled 2021-08-12: qty 15

## 2021-08-12 MED ORDER — SODIUM CHLORIDE 0.9 % IV SOLN: 100.0000 mL | INTRAVENOUS | Status: DC | PRN

## 2021-08-12 MED ORDER — PREDNISONE 50 MG PO TABS
50.0000 mg | ORAL_TABLET | Freq: Once | ORAL | Status: AC
  Administered 2021-08-13: 50 mg via ORAL

## 2021-08-12 MED ORDER — LIDOCAINE HCL (PF) 1 % IJ SOLN: 5.0000 mL | INTRAMUSCULAR | Status: DC | PRN

## 2021-08-12 MED ORDER — LIDOCAINE-PRILOCAINE 2.5-2.5 % EX CREA: 1.0000 "application " | TOPICAL_CREAM | CUTANEOUS | Status: DC | PRN

## 2021-08-12 MED ORDER — HYDROMORPHONE HCL 2 MG PO TABS
ORAL_TABLET | ORAL | Status: AC
  Filled 2021-08-12: qty 1

## 2021-08-12 MED ORDER — PREDNISONE 50 MG PO TABS
50.0000 mg | ORAL_TABLET | Freq: Once | ORAL | Status: AC
  Administered 2021-08-13: 50 mg via ORAL
  Filled 2021-08-12: qty 1

## 2021-08-12 MED ORDER — DARBEPOETIN ALFA 25 MCG/0.42ML IJ SOSY
PREFILLED_SYRINGE | INTRAMUSCULAR | Status: AC
  Filled 2021-08-12: qty 0.42

## 2021-08-12 MED ORDER — ALTEPLASE 2 MG IJ SOLR: 2.0000 mg | Freq: Once | INTRAMUSCULAR | Status: DC | PRN

## 2021-08-12 MED ORDER — DIPHENHYDRAMINE HCL 25 MG PO CAPS
50.0000 mg | ORAL_CAPSULE | Freq: Once | ORAL | Status: AC
  Administered 2021-08-13: 50 mg via ORAL
  Filled 2021-08-12: qty 2

## 2021-08-12 MED ORDER — HEPARIN SODIUM (PORCINE) 1000 UNIT/ML DIALYSIS: 1000.0000 [IU] | INTRAMUSCULAR | Status: DC | PRN

## 2021-08-12 MED ORDER — PREDNISONE 50 MG PO TABS
50.0000 mg | ORAL_TABLET | Freq: Once | ORAL | Status: AC
  Filled 2021-08-12: qty 1

## 2021-08-12 NOTE — Consult Note (Signed)
Chief Complaint: Patient was seen in consultation today for  Chief Complaint  Patient presents with   Hypotension    Referring Physician(s): Dr. Joelyn Oms, nephrology   Supervising Physician: Aletta Edouard  Patient Status: [facility] - In-pt  History of Present Illness: [name] is a 55 y.o. female with a medical history significant for ESRD on HD, DM2, HTN, heart failure, COPD and chronic leg edema. She presented to the ED 06/21/21 with right hip/leg pain and evidence of cellulitis. She was admitted and started on IV antibiotics. Her hospital course was complicated by hypotension and evidence of biventricular failure and pulmonary hypertension requiring vasopressors.   She has stabilized and has discharge plans in place once she is strong enough to tolerate outpatient hemodialysis. Her left AVF has been oozing post-HD for a few weeks and vascular ultrasound shows findings consistent with stenosis.   Interventional Radiology has been asked to evaluate this patient for an image-guided fistulogram with possible intervention, possible placement of tunneled dialysis catheter.   Past Medical History:  Diagnosis Date   Allergy    Phreesia 11/04/2020   Anemia, unspecified    Anxiety    Arthritis    Phreesia 11/04/2020   Asthma    as a child   Cancer ([place])    thyroid   Cellulitis    CHF (congestive heart failure) ([place])    Chiari malformation    s/p surgery   Chiari malformation    Chronic kidney disease    Phreesia 11/04/2020   Chronic kidney disease, stage 5 (HCC)    Chronic obstructive pulmonary disease, unspecified ([place])    Chronic pain    Complication of anesthesia 11/28/15   Resp arrest after  conscious  sedation   Compression of brain (HCC)    Constipation    COPD (chronic obstructive pulmonary disease) ([place])    Phreesia 11/04/2020   Coronary artery disease    40-50% mid LAD 04/29/09, Medical tx. (Dr. Gwenlyn Found)   Diabetes mellitus    Type II- reports being off all  medication d/t it being controlled   Diabetes mellitus without complication ([place])    Phreesia 11/04/2020   Essential (primary) hypertension    Fibromyalgia    Fibromyalgia    History of blood transfusion    hemorrage duinrg pregancy   Hx of echocardiogram 10/2011   EF 55-60%   Hypercholesterolemia    Hyperlipidemia, unspecified    Hypertension    Hypertensive chronic kidney disease with stage 1 through stage 4 chronic kidney disease, or unspecified chronic kidney disease    Hypertensive chronic kidney disease with stage 5 chronic kidney disease or end stage renal disease (HCC)    Lymph edema    Lymphedema, not elsewhere classified    Morbid (severe) obesity due to excess calories (HCC)    Obesity hypoventilation syndrome ([place])    On home oxygen therapy    "2L; 24/7" (12/21/2018)   Peritonitis, unspecified ([place])    Pneumonia    in past   Pulmonary hypertension ([place])    Renal disorder    M/W/F [facility] Pt started dialysis in Dec.2016   S/P colonoscopy 05/26/2007   Dr. Laural Golden sigmoid diverticulosis random biopsies benign   S/P endoscopy 05/01/2009   Dr. Penelope Coop pill-induced esophageal ulcerations distal to midesophagus, 2 small ulcers in the antrum of the stomach   Shortness of breath dyspnea    with any exertion or if heart rate  is irregular while on dialysis   Sleep apnea  reports that she no longer needs CPAP due to weight loss   Sleep apnea, unspecified    Type 2 diabetes mellitus with diabetic nephropathy ([place])    Type 2 diabetes mellitus with hyperglycemia [facility])     Past Surgical History:  Procedure Laterality Date   .Hemodialysis catheter Right 11/28/2015   A/V FISTULAGRAM N/A 07/26/2017   Procedure: A/V Fistulagram - Left Arm;  Surgeon: Serafina Mitchell, MD;  Location: [place] [facility];  Service: Cardiovascular;  Laterality: N/A;   ABDOMINAL HYSTERECTOMY     total hysterectomy   ADENOIDECTOMY     AV FISTULA PLACEMENT Left 03/15/2016   Procedure: CREATION  OF LEFT ARM ARTERIOVENOUS (AV) FISTULA  ;  Surgeon: Rosetta Posner, MD;  Location: [facility];  Service: Vascular;  Laterality: Left;   CAPD INSERTION N/A 08/30/2017   Procedure: LAPAROSCOPIC INSERTION CONTINUOUS AMBULATORY PERITONEAL DIALYSIS  (CAPD) CATHETER;  Surgeon: Clovis Riley, MD;  Location: [facility];  Service: General;  Laterality: N/A;   CARDIOVERSION N/A 07/03/2021   Procedure: CARDIOVERSION;  Surgeon: Larey Dresser, MD;  Location: [facility] ENDOSCOPY;  Service: Cardiovascular;  Laterality: N/A;   CESAREAN SECTION      x 2   CESAREAN SECTION N/A    Phreesia 11/04/2020   CRANIECTOMY SUBOCCIPITAL W/ CERVICAL LAMINECTOMY / CHIARI     FISTULA SUPERFICIALIZATION Left 05/10/2016   Procedure: Left Arm FISTULA SUPERFICIALIZATION;  Surgeon: Rosetta Posner, MD;  Location: [place];  Service: Vascular;  Laterality: Left;   IR GENERIC HISTORICAL  08/14/2016   IR REMOVAL TUN ACCESS W/ PORT W/O FL MOD SED 08/14/2016 Arne Cleveland, MD [facility]-INTERV RAD   IR SINUS/FIST TUBE CHK-NON GI  01/01/2019   IR US GUIDE BX ASP/DRAIN  12/28/2018   MINOR REMOVAL OF PERITONEAL DIALYSIS CATHETER N/A 04/26/2019   Procedure: REMOVAL OF INFECTED PERITONEAL DIALYSIS CATHETER;  Surgeon: Erroll Luna, MD;  Location: [facility];  Service: General;  Laterality: N/A;   PERIPHERAL VASCULAR BALLOON ANGIOPLASTY Left 10/18/2019   Procedure: PERIPHERAL VASCULAR BALLOON ANGIOPLASTY;  Surgeon: Marty Heck, MD;  Location: [place] [facility];  Service: Cardiovascular;  Laterality: Left;  arm fistula   PERIPHERAL VASCULAR CATHETERIZATION Left 11/25/2016   Procedure: A/V Fistulagram;  Surgeon: Conrad Morenci, MD;  Location: [place] [facility];  Service: Cardiovascular;  Laterality: Left;  arm   PORTACATH PLACEMENT  07/05/2012   Procedure: INSERTION PORT-A-CATH;  Surgeon: Donato Heinz, MD;  Location: [facility];  Service: General;  Laterality: Left;  subclavian   portacath removal     RIGHT HEART CATH N/A 08/04/2017   Procedure: RIGHT HEART CATH;   Surgeon: Jolaine Artist, MD;  Location: [place] [facility];  Service: Cardiovascular;  Laterality: N/A;   RIGHT HEART CATH N/A 07/02/2021   Procedure: RIGHT HEART CATH;  Surgeon: Larey Dresser, MD;  Location: [place] [facility];  Service: Cardiovascular;  Laterality: N/A;   TEE WITHOUT CARDIOVERSION N/A 07/03/2021   Procedure: TRANSESOPHAGEAL ECHOCARDIOGRAM (TEE);  Surgeon: Larey Dresser, MD;  Location: [facility];  Service: Cardiovascular;  Laterality: N/A;   THYROIDECTOMY, PARTIAL     TONSILLECTOMY      Allergies: Contrast media [iodinated diagnostic agents], Pneumococcal vaccines, and Vancomycin  Medications: Prior to Admission medications   Medication Sig Start Date End Date Taking? Authorizing Provider  acetaminophen (TYLENOL) 325 MG tablet Take 2 tablets (650 mg total) by mouth every 4 (four) hours as needed for mild pain (or Fever >/= 101). 07/04/20  Yes  Roxan Hockey, MD  calcium acetate (PHOSLO) 667 MG capsule Take 1,334 mg by mouth 3 (three) times daily. 05/27/21  Yes [provider]  calcium carbonate (TUMS - DOSED IN MG ELEMENTAL CALCIUM) 500 MG chewable tablet Chew 2 tablets by mouth 2 (two) times daily.   Yes [provider]  ibuprofen (ADVIL) 200 MG tablet Take 400 mg by mouth every 6 (six) hours as needed for moderate pain.   Yes [provider]  lanthanum (FOSRENOL) 500 MG chewable tablet Chew 1,000-1,500 mg by mouth 5 (five) times daily. 1500 mg by mouth three times a day with meals and 1000 mg by mouth twice a day with snacks   Yes [provider]  loperamide (IMODIUM) 2 MG capsule Take 2-4 mg by mouth as needed for diarrhea or loose stools.    Yes [provider]  multivitamin (RENA-VIT) TABS tablet Take 1 tablet by mouth daily. 05/30/18  Yes [provider]  Oxycodone HCl 10 MG TABS Take 1 tablet (10 mg total) by mouth 4 (four) times daily as needed (pain). 06/21/21  Yes Tat, Shanon Brow, MD  Sennosides (EX-LAX PO)  Take 2 tablets by mouth daily as needed (constipation).   Yes [provider]  insulin lispro (HUMALOG) 100 UNIT/ML injection Inject 60 Units into the skin 2 (two) times daily.    03/08/12  [provider]     Family History  Problem Relation Age of Onset   Colon cancer Mother 16   Stroke Mother 79   Coronary artery disease Mother     Social History   Socioeconomic History   Marital status: Married    Spouse name: Not on file   Number of children: 3   Years of education: Not on file   Highest education level: Not on file  Occupational History   Not on file  Tobacco Use   Smoking status: Never   Smokeless tobacco: Never  Vaping Use   Vaping Use: Never used  Substance and Sexual Activity   Alcohol use: No    Alcohol/week: 0.0 standard drinks   Drug use: No   Sexual activity: Not Currently  Other Topics Concern   Not on file  Social History Narrative   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  All other systems are negative.  Review of Systems  Constitutional:  Negative for appetite change and fatigue.  Respiratory:  Negative for cough and shortness of breath.   Cardiovascular:  Positive for leg swelling.  Gastrointestinal:  Positive for nausea. Negative for abdominal pain, diarrhea and vomiting.  Neurological:  Negative for headaches.   Vital Signs: BP 124/62 (BP  Location: Right Arm)   Pulse (!) 59   Temp 97.9 F (36.6 C) (Oral)   Resp 18   Ht _0  (1.651 m)   Wt (!) 303 lb 2.2 oz (137.5  kg)   SpO2 94%   BMI 50.44 kg/m   Physical Exam Constitutional:      General: She is not in acute distress.    Appearance: She is not ill-appearing.  HENT:     Mouth/Throat:     Mouth: Mucous membranes are moist.     Pharynx: Oropharynx is clear.  Cardiovascular:     Rate and Rhythm: Normal rate and regular rhythm.     Comments: LUA AVF - currently accessed for HD.  Pulmonary:     Effort: Pulmonary effort is normal.     Breath sounds: Normal breath sounds.  Abdominal:     General: Bowel sounds are normal.     Palpations: Abdomen is soft.     Tenderness: There is no abdominal tenderness.  Musculoskeletal:     Right lower leg: Edema present.     Left lower leg: Edema present.  Skin:    General: Skin is warm and dry.     Comments: Left upper thigh wound per patient. Undergoing regular wound care therapy  Neurological:     Mental Status: She is alert and oriented to person, place, and time.    Imaging: CT FEMUR LEFT WO CONTRAST  Result Date: 07/16/2021 CLINICAL DATA:  Soft tissue infection suspected, hip, xray done EXAM: CT OF THE LOWER LEFT EXTREMITY WITHOUT CONTRAST TECHNIQUE: Multidetector CT imaging of the lower left extremity was performed according to the standard protocol. COMPARISON:  None. FINDINGS: Technical note: Examination is limited secondary to beam hardening artifact related to patient body habitus. Portions of the lateral thigh were not able to be included within the field of view. Bones/Joint/Cartilage No acute fracture. No dislocation. Alignment at the hip and knee joints are maintained. Medial compartment osteoarthritis of the left knee with subchondral cystic change of the medial femoral condyle and medial tibial plateau. Trace knee joint effusion, nonspecific. Ligaments Suboptimally assessed by CT. Muscles and Tendons Mild  generalized fatty atrophy of the thigh musculature. Soft tissues Prominent skin thickening most pronounced at the medial and posterior aspects of the mid to distal thigh. Prominent subcutaneous edema throughout the thigh with dependent fluid within the soft tissues. Numerous scattered soft tissue calcifications. Findings are relatively symmetric compared to the included portion of the contralateral right thigh. No organized fluid collection is evident. No soft tissue gas. IMPRESSION: 1. Limited exam. 2. Prominent skin thickening most pronounced at the medial and posterior aspects of the mid to distal left thigh. Prominent subcutaneous edema throughout the thigh with dependent fluid within the soft tissues. Findings are relatively symmetric compared to the included portion of the contralateral right thigh. Findings are nonspecific and could reflect changes of chronic lymphedema or possibly cellulitis. No organized fluid collection is evident. No soft tissue gas. 3. No acute osseous abnormality. 4. Medial compartment osteoarthritis of the left knee. Electronically Signed   By: Davina Poke D.O.   On: 07/16/2021 08:11   VAS US DUPLEX DIALYSIS ACCESS (AVF, AVG)  Result Date: 08/12/2021 DIALYSIS ACCESS Patient Name:  [name]  Date of Exam:   08/12/2021 Medical Rec #: [id number]         Accession #:    [id number] Date of Birth: 1965-12-31         Patient Gender: F Patient Age:   44 years Exam Location:  [facility] Procedure:      VAS US DUPLEX DIALYSIS ACCESS (AVF, AVG) Referring Phys: Pearson Grippe --------------------------------------------------------------------------------  Reason for Exam: Excessive bleeding through AVF. Access  Site: Left Upper Extremity. Access Type: Radial-cephalic AVF. Comparison Study: No prior study Performing Technologist: Maudry Mayhew MHA, RDMS, RVT, RDCS  Examination Guidelines: A complete evaluation includes B-mode imaging, spectral Doppler, color Doppler, and  power Doppler as needed of all accessible portions of each vessel. Unilateral testing is considered an integral part of a complete examination. Limited examinations for reoccurring indications may be performed as noted.  Findings: +--------------------+----------+-----------------+--------+ AVF                 PSV (cm/s)Flow Vol (mL/min)Comments +--------------------+----------+-----------------+--------+ Native artery inflow   327           423                +--------------------+----------+-----------------+--------+ AVF Anastomosis        291                              +--------------------+----------+-----------------+--------+  +------------+----------+-------------+----------+----------+ OUTFLOW VEINPSV (cm/s)Diameter (cm)Depth (cm) Describe  +------------+----------+-------------+----------+----------+ AC Fossa                  0.89        0.39              +------------+----------+-------------+----------+----------+ Prox Forearm    72        2.11        0.21   aneurysmal +------------+----------+-------------+----------+----------+ Mid Forearm     40        0.90        0.18              +------------+----------+-------------+----------+----------+ Dist Forearm   375        0.83        0.15              +------------+----------+-------------+----------+----------+   Summary: Arteriovenous fistula-Velocities less than 100cm/s noted. Arteriovenous fistula-Aneurysmal dilatation noted. *See table(s) above for measurements and observations.      --------------------------------------------------------------------------------   Preliminary     Labs:  CBC: Recent Labs    08/04/21 0043 08/06/21 1344 08/10/21 0752 08/12/21 0843  WBC 8.8 10.1 8.0 6.4  HGB 11.0* 11.3* 10.5* 10.7*  HCT 34.2* 34.6* 32.4* 33.5*  PLT 265 232 245 218    COAGS: Recent Labs    06/19/21 1815 06/23/21 0153 07/03/21 0442 07/05/21 0111 07/06/21 0044 07/07/21 0442  07/15/21 0420  INR 1.1 1.9*  --   --   --   --  1.9*  APTT 31  --    < > 40* 75* 35 38*   < > = values in this interval not displayed.    BMP: Recent Labs    08/05/21 0141 08/07/21 0211 08/10/21 0752 08/12/21 0843  NA 131* 132* 131* 132*  K 4.0 4.0 4.1 3.5  CL 95* 93* 93* 95*  CO2 _0 GLUCOSE 138* 95 104* 147*  BUN 38* 37* 52* 49*  CALCIUM 7.8* 8.3* 7.9* 7.3*  CREATININE 5.78* 5.57* 6.44* 6.35*  GFRNONAA 8* 8* 7* 7*    LIVER FUNCTION TESTS: Recent Labs    06/28/21 0241 06/29/21 0628 07/17/21 0047 07/18/21 0047 07/19/21 0205 07/21/21 0120 07/31/21 0037 08/02/21 0010 08/05/21 0141 08/10/21 0752  BILITOT 0.6  --  0.7 0.6 1.0  --   --   --   --   --   AST 38  --  _1 --   --   --   --   --  ALT 6  --  _0 --   --   --   --   --   ALKPHOS 328*  --  257* 267* 287*  --   --   --   --   --   PROT 6.0*  --  6.7 7.0 8.0  --   --   --   --   --   ALBUMIN 2.8*   < > 3.1* 3.1* 3.5   < > 3.8 3.8 3.7 3.4*   < > = values in this interval not displayed.    TUMOR MARKERS: No results for input(s): AFPTM, CEA, CA199, CHROMGRNA in the last 8760 hours.  Assessment and Plan:  Malfunctioning AVF; prolonged bleeding after HD: [name], 55 year old female, is tentatively scheduled 08/13/21 for an image-guided fistulogram with possible intervention, possible placement of a tunneled dialysis cathter.   Risks and benefits discussed with the patient including, but not limited to bleeding, infection, vascular injury, pneumothorax which may require chest tube placement, air embolism or even death  All of the patient's questions were answered, patient is agreeable to proceed. She will be NPO at midnight. The patient has a contrast allergy. Prednisone and benadryl have been ordered per protocol.   Consent signed and in IR  Thank you for this interesting consult.  I greatly enjoyed meeting [name] and look forward to participating in their care.  A copy  of this report was sent to the requesting provider on this date.  Electronically Signed: Soyla Dryer, AGACNP-BC [phone] 08/12/2021, 3:00 PM   I spent a total of 20 Minutes    in face to face in clinical consultation, greater than 50% of which was counseling/coordinating care for fistulogram with possible intervention, possible placement of tunneled dialysis catheter.

## 2021-08-12 NOTE — Progress Notes (Signed)
Nutrition Follow Up  DOCUMENTATION CODES:  Not applicable  INTERVENTION:  -Continue Nepro Shake po TID, each supplement provides 425 kcal and 19 grams protein -Continue renal MVI daily   NUTRITION DIAGNOSIS:  Increased nutrient needs related to wound healing as evidenced by estimated needs. -- ongoing  GOAL:  Patient will meet greater than or equal to 90% of their needs -- progressing  MONITOR:  PO intake, Supplement acceptance, Weight trends, I & O's, Labs  REASON FOR ASSESSMENT:  Rounds    ASSESSMENT:  Patient with PMH significant for thyroid cancer, CHF, Chiari malformation, ESRD/HD, COPD, CAD, HLD, HTN, DM, and lymphedema. Presents this admission with cardiogenic shock from RV failure.  7/15- s/p DC cardioversion  Per MD, there was concern for possible acute infectious process of L medial thigh wound on 8/16 but wound is not highly suggestive of infection except for worsening smell. Wound care was re-consulted and Plastic Surgery was consulted. Plan for wound debridement on 8/31.   Plan continues to be for pt to discharge to SNF once able to tolerate outpatient HD while seated. Per MD, pt unlikely to discharge for several days but pt is tolerating a chair more consistently. Nephrology noted pt tolerated HD well Monday with 3L net UF. Plan for HD again today. Pt also to have fistulogram with IR due to prolonged bleeding/oozing from AV fistula.   Appetite has been improving over the last few days. Last 8 meal completions charted as 0-100% (~61% average meal intake). Pt doing well with Nepro shakes. Continue current nutrition plan of care.   EDW: 135 kg  Current weight: 137.5 kg   Medications: sensipar, aranesp, SSI, rena-vit, protonix, miralax, senokot, IV phenergan  Labs: Na 132 (L), Cr 6.35 (H)  CBG [phone]  Diet Order:   Diet Order             Diet NPO time specified  Diet effective midnight           Diet renal/carb modified with fluid restriction Diet-HS  Snack? Nothing; Fluid restriction: 1200 mL Fluid; Room service appropriate? Yes; Fluid consistency: Thin  Diet effective now                  EDUCATION NEEDS:  Education needs have been addressed  Skin:  Skin Assessment: Skin Integrity Issues: Skin Integrity Issues:: Stage II, Unstageable Stage II: R thigh Unstageable: L thigh  Last BM:  8/23  Height:  Ht Readings from Last 1 Encounters:  07/16/21 _0  (1.651 m)   Weight:  Wt Readings from Last 1 Encounters:  08/11/21 (!) 137.5 kg   BMI:  Body mass index is 50.44 kg/m.  Estimated Nutritional Needs:  Kcal:  1800-2000 kcal Protein:  90-105 grams Fluid:  >/= 1.8 L/day    Larkin Ina, MS, RD, LDN (she/her/hers) RD pager number and weekend/on-call pager number located in [place].

## 2021-08-12 NOTE — Progress Notes (Signed)
[facility] NEPHROLOGY PROGRESS NOTE  Assessment/ Plan:  # Acute exacerbation of chronic right ventricular heart failure with pulmonary hypertension/shock: Remains on midodrine and as needed albumin at hemodialysis for hypotension.  Continue hemodialysis while maintaining systolic blood pressure >36.  # ESRD, MWF: Outpt [facility].  On schedule here.  LFA AVF.    Encouraged to try and prolong her duration of sitting in a recliner.  #Prolonged bleeding/oozing from AV fistula: Some swelling as well.  Plan for fistulogram with IR 8/25.  Likely exacerbated by use of apixaban.  # Anemia: hb at goal, continue ESA with surveillance of hemoglobin/hematocrit.  # CKD-MBD: Continue Sensipar for PTH control.  Calcium and phosphorus levels currently at acceptable levels, off of calcium acetate.  # Nutrition: Continue renal diet with oral nutritional supplementation.  # Hypotension/volumes: Blood pressures currently at goal, monitor with hemodialysis/ultrafiltration.  She uses midodrine  Subjective:  Duplex suggested stensis, for Fgram tomorrow with IR Seen on HD. UF goal 4L. 2K bath. AVF AP ok.  K is 3.5 move to 3K No other complaints at the current time  Objective Vital signs in last 24 hours: Vitals:   08/11/21 2120 08/11/21 2300 08/12/21 0432 08/12/21 0859  BP: 111/64  127/73 120/70  Pulse: 70 70 69 64  Resp: _0 Temp: 97.7 F (36.5 C)  97.7 F (36.5 C) 98.2 F (36.8 C)  TempSrc: Oral  Oral   SpO2: 98% 97% 97% 94%  Weight: (!) 137.5 kg     Height:       Weight change: -3.098 kg  Intake/Output Summary (Last 24 hours) at 08/12/2021 1420 Last data filed at 08/12/2021 1300 Gross per 24 hour  Intake 600 ml  Output 0 ml  Net 600 ml        Labs: Basic Metabolic Panel: Recent Labs  Lab 08/07/21 0211 08/10/21 0752 08/12/21 0843  NA 132* 131* 132*  K 4.0 4.1 3.5  CL 93* 93* 95*  CO2 _1 GLUCOSE 95 104* 147*  BUN 37* 52* 49*  CREATININE  5.57* 6.44* 6.35*  CALCIUM 8.3* 7.9* 7.3*  PHOS  --  3.6  --     Liver Function Tests: Recent Labs  Lab 08/10/21 0752  ALBUMIN 3.4*    No results for input(s): LIPASE, AMYLASE in the last 168 hours. No results for input(s): AMMONIA in the last 168 hours. CBC: Recent Labs  Lab 08/06/21 1344 08/10/21 0752 08/12/21 0843  WBC 10.1 8.0 6.4  NEUTROABS  --   --  4.3  HGB 11.3* 10.5* 10.7*  HCT 34.6* 32.4* 33.5*  MCV 93.5 92.3 92.0  PLT 232 245 218    Cardiac Enzymes: No results for input(s): CKTOTAL, CKMB, CKMBINDEX, TROPONINI in the last 168 hours. CBG: Recent Labs  Lab 08/11/21 0638 08/11/21 1210 08/11/21 1656 08/11/21 2121 08/12/21 0544  GLUCAP 105* 150* 124* 134* 120*     Iron Studies: No results for input(s): IRON, TIBC, TRANSFERRIN, FERRITIN in the last 72 hours. Studies/Results: VAS US DUPLEX DIALYSIS ACCESS (AVF, AVG)  Result Date: 08/12/2021 DIALYSIS ACCESS Patient Name:  [name]  Date of Exam:   08/12/2021 Medical Rec #: [id number]         Accession #:    [id number] Date of Birth: Sep 25, 1966         Patient Gender: F Patient Age:   55 years Exam Location:  [facility] Procedure:      VAS US DUPLEX DIALYSIS  ACCESS (AVF, AVG) Referring Phys: Pearson Grippe --------------------------------------------------------------------------------  Reason for Exam: Excessive bleeding through AVF. Access Site: Left Upper Extremity. Access Type: Radial-cephalic AVF. Comparison Study: No prior study Performing Technologist: Maudry Mayhew MHA, RDMS, RVT, RDCS  Examination Guidelines: A complete evaluation includes B-mode imaging, spectral Doppler, color Doppler, and power Doppler as needed of all accessible portions of each vessel. Unilateral testing is considered an integral part of a complete examination. Limited examinations for reoccurring indications may be performed as noted.  Findings: +--------------------+----------+-----------------+--------+ AVF                  PSV (cm/s)Flow Vol (mL/min)Comments +--------------------+----------+-----------------+--------+ Native artery inflow   327           423                +--------------------+----------+-----------------+--------+ AVF Anastomosis        291                              +--------------------+----------+-----------------+--------+  +------------+----------+-------------+----------+----------+ OUTFLOW VEINPSV (cm/s)Diameter (cm)Depth (cm) Describe  +------------+----------+-------------+----------+----------+ AC Fossa                  0.89        0.39              +------------+----------+-------------+----------+----------+ Prox Forearm    72        2.11        0.21   aneurysmal +------------+----------+-------------+----------+----------+ Mid Forearm     40        0.90        0.18              +------------+----------+-------------+----------+----------+ Dist Forearm   375        0.83        0.15              +------------+----------+-------------+----------+----------+   Summary: Arteriovenous fistula-Velocities less than 100cm/s noted. Arteriovenous fistula-Aneurysmal dilatation noted. *See table(s) above for measurements and observations.      --------------------------------------------------------------------------------   Preliminary     Medications: Infusions:  sodium chloride     promethazine (PHENERGAN) injection (IM or IVPB) 12.5 mg (08/08/21 0901)    Scheduled Medications:  amiodarone  200 mg Oral Daily   apixaban  5 mg Oral BID   Chlorhexidine Gluconate Cloth  6 each Topical Q0600   cinacalcet  60 mg Oral Q M,W,F-HD   collagenase   Topical Daily   darbepoetin (ARANESP) injection - DIALYSIS  25 mcg Intravenous Q Wed-HD   DULoxetine  20 mg Oral Daily   feeding supplement (NEPRO CARB STEADY)  237 mL Oral TID BM   insulin aspart  0-15 Units Subcutaneous TID WC   insulin aspart  0-5 Units Subcutaneous QHS   mouth rinse  15 mL Mouth Rinse BID    midodrine  15 mg Oral TID WC   multivitamin  1 tablet Oral QHS   pantoprazole  40 mg Oral BID   polyethylene glycol  17 g Oral Daily   senna  1 tablet Oral Daily    have reviewed scheduled and prn medications.  Physical Exam: General:NAD, looks comfortable, lying on bed Heart:RRR, s1s2 nl Lungs: Distant breath sound bilateral, Abdomen:soft, Non-tender, non-distended Extremities: Some venous stasis changes and has a dressing on. Dialysis Access: Left AV fistula has good thrill and bruit.  Romelle Reiley B Gloriann Riede 08/12/2021,2:20 PM  LOS: 52 days

## 2021-08-12 NOTE — Progress Notes (Signed)
PROGRESS NOTE  [name]  DOB: 03-10-1966  PCP: Noreene Larsson, NP CXK:[id number]  DOA: 06/21/2021  LOS: 57 days  Hospital Day: 79   Chief Complaint  Patient presents with   Hypotension    Brief narrative: [name] is a 55 y.o. female with PMH significant for ESRD on HD, morbid obesity, DM2, HTN, CHF, chronic lymphedema, COPD. Patient presented to the ED on 06/21/2021 with complaint of right hip and leg pain with evidence of cellulitis.   She was started on empiric antibiotics but continued to have issues with hypotension with evidence of cardiogenic shock from biventricular failure and pulmonary hypertension.  She was managed in the ICU with vasopressors and hemodialysis. Currently blood pressure is controlled and is stable on midodrine.   Plastic surgery was consulted and patient is planned for I&D next week on 8/31.  Subjective: Patient was seen and examined this morning.  Middle-aged morbidly obese African-American female.  Not in distress.  No new symptoms.  Assessment/Plan: Cardiogenic shock Acute exacerbation of chronic right ventricular failure with pulmonary hypertension -patient required Vasopressor support in ICU.  Currently on midodrine.   -EF 55 to 60% on last echo from 7/15   ESRD-HD-MWF Chronic hypertension -Nephrology following.  Noted that inpatient dialysis have been challenging because of hypotension on sitting up.  Encouraged to try and prolong the duration of sitting in a recliner. -Continue midodrine.  Prolonged bleeding/oozing from AV fistula -Per nephrology note, patient is planned for fistulogram with IR on 8/25, likely exacerbated by use of apixaban.  Left medial thigh wound -Seen by plastic surgery.  Noted the plan for wound debridement on 8/31.   Infected RUE midline catheter Midline removed and patient treated with linezolid for 5 days. Resolved.   Atypical atrial flutter -Patient underwent DCCV on 7/15 with reversion to normal  sinus rhythm. -Continue amiodarone, Eliquis    Possible OSA/OHS Pulmonary hypertension -Started on CPAP during hospitalization -Continue CPAP with recommendation for outpatient sleep study   Fibromyalgia -Continue Cymbalta   Anemia of chronic disease -Hemoglobin stable. Recent Labs    07/21/21 0120 07/22/21 0104 07/24/21 9702 07/27/21 0703 07/29/21 0040 07/31/21 0037 08/04/21 0043 08/06/21 1344 08/10/21 0752 08/12/21 0843  HGB 11.1* 11.1* 10.7* 10.4* 9.9* 10.5* 11.0* 11.3* 10.5* 10.7*    Diabetes mellitus, type 2 Hemoglobin A1c of 6.9%. Not on medication management as an outpatient.   -Continue SSI   Morbid obesity  -Body mass index is 50.44 kg/m. Patient has been advised to make an attempt to improve diet and exercise patterns to aid in weight loss.   Weakness PT/OT recommended SNF.     Pressure injury Right/posterior/proximal thigh. Not present on admission.   Mobility: PT eval obtained Code Status:   Code Status: Full Code  Nutritional status: Body mass index is 50.44 kg/m. Nutrition Problem: Increased nutrient needs Etiology: wound healing Signs/Symptoms: estimated needs Diet:  Diet Order             Diet NPO time specified  Diet effective midnight           Diet NPO time specified Except for: Sips with Meds  Diet effective midnight           Diet renal/carb modified with fluid restriction Diet-HS Snack? Nothing; Fluid restriction: 1200 mL Fluid; Room service appropriate? Yes; Fluid consistency: Thin  Diet effective now                  DVT prophylaxis: apixaban (ELIQUIS) tablet 5  mg    Antimicrobials: None Fluid: None Consultants: Nephrology Family Communication: None at bedside  Status is: Inpatient  Remains inpatient appropriate because: Pending I&D on 8/31  Dispo: The patient is from: Home              Anticipated d/c is to: SNF              Patient currently is not medically stable to d/c.   Difficult to place patient  No     Infusions:   sodium chloride     sodium chloride     sodium chloride     promethazine (PHENERGAN) injection (IM or IVPB) 12.5 mg (08/08/21 0901)    Scheduled Meds:  amiodarone  200 mg Oral Daily   apixaban  5 mg Oral BID   Chlorhexidine Gluconate Cloth  6 each Topical Q0600   cinacalcet  60 mg Oral Q M,W,F-HD   collagenase   Topical Daily   Darbepoetin Alfa       darbepoetin (ARANESP) injection - DIALYSIS  25 mcg Intravenous Q Wed-HD   [START ON 08/13/2021] diphenhydrAMINE  50 mg Oral Once   DULoxetine  20 mg Oral Daily   feeding supplement (NEPRO CARB STEADY)  237 mL Oral TID BM   insulin aspart  0-15 Units Subcutaneous TID WC   insulin aspart  0-5 Units Subcutaneous QHS   mouth rinse  15 mL Mouth Rinse BID   midodrine  15 mg Oral TID WC   multivitamin  1 tablet Oral QHS   pantoprazole  40 mg Oral BID   polyethylene glycol  17 g Oral Daily   [START ON 08/13/2021] predniSONE  50 mg Oral See admin instructions   senna  1 tablet Oral Daily    Antimicrobials: Anti-infectives (From admission, onward)    Start     Dose/Rate Route Frequency Ordered Stop   08/06/21 1530  piperacillin-tazobactam (ZOSYN) IVPB 2.25 g        2.25 g 100 mL/hr over 30 Minutes Intravenous Every 8 hours 08/06/21 1434 08/10/21 2347   07/16/21 2000  DAPTOmycin (CUBICIN) 500 mg in sodium chloride 0.9 % IVPB  Status:  Discontinued        6 mg/kg  87.4 kg (Adjusted) 120 mL/hr over 30 Minutes Intravenous Every 48 hours 07/15/21 1138 07/16/21 1043   07/16/21 1200  linezolid (ZYVOX) tablet 600 mg        600 mg Oral Every 12 hours 07/16/21 1101 07/21/21 2018   07/15/21 1000  ceFEPIme (MAXIPIME) 1 g in sodium chloride 0.9 % 100 mL IVPB  Status:  Discontinued        1 g 200 mL/hr over 30 Minutes Intravenous Every 24 hours 07/15/21 0913 07/16/21 1043   07/15/21 0900  cefTRIAXone (ROCEPHIN) 1 g in sodium chloride 0.9 % 100 mL IVPB  Status:  Discontinued        1 g 200 mL/hr over 30 Minutes Intravenous  Every 24 hours 07/15/21 0752 07/15/21 0913   07/15/21 0500  DAPTOmycin (CUBICIN) 500 mg in sodium chloride 0.9 % IVPB  Status:  Discontinued        6 mg/kg  87.4 kg (Adjusted) 120 mL/hr over 30 Minutes Intravenous Every 48 hours 07/15/21 0404 07/15/21 1138   06/23/21 0930  DAPTOmycin (CUBICIN) 500 mg in sodium chloride 0.9 % IVPB        6 mg/kg  86.2 kg (Adjusted) 120 mL/hr over 30 Minutes Intravenous Every 48 hours 06/23/21 0831 06/28/21 0130  06/23/21 0915  DAPTOmycin (CUBICIN) 800 mg in sodium chloride 0.9 % IVPB  Status:  Discontinued        6 mg/kg  130.1 kg 132 mL/hr over 30 Minutes Intravenous Every 48 hours 06/23/21 0825 06/23/21 0831   06/22/21 2200  ceFEPIme (MAXIPIME) 1 g in sodium chloride 0.9 % 100 mL IVPB  Status:  Discontinued        1 g 200 mL/hr over 30 Minutes Intravenous Every 24 hours 06/22/21 0001 06/23/21 0825   06/22/21 0800  metroNIDAZOLE (FLAGYL) IVPB 500 mg  Status:  Discontinued        500 mg 100 mL/hr over 60 Minutes Intravenous Every 8 hours 06/22/21 0716 06/23/21 0825   06/21/21 2230  doxycycline (VIBRAMYCIN) 100 mg in sodium chloride 0.9 % 250 mL IVPB  Status:  Discontinued        100 mg 125 mL/hr over 120 Minutes Intravenous Every 12 hours 06/21/21 2223 06/23/21 0825   06/21/21 2215  ceFEPIme (MAXIPIME) 2 g in sodium chloride 0.9 % 100 mL IVPB        2 g 200 mL/hr over 30 Minutes Intravenous  Once 06/21/21 2208 06/21/21 2245   06/21/21 2215  metroNIDAZOLE (FLAGYL) IVPB 500 mg        500 mg 100 mL/hr over 60 Minutes Intravenous  Once 06/21/21 2208 06/21/21 2340       PRN meds: sodium chloride, sodium chloride, acetaminophen, alteplase, dextromethorphan-guaiFENesin, diphenhydrAMINE, docusate sodium, heparin, HYDROmorphone, ipratropium-albuterol, lidocaine (PF), lidocaine-prilocaine, ondansetron (ZOFRAN) IV, pentafluoroprop-tetrafluoroeth, polyethylene glycol, promethazine (PHENERGAN) injection (IM or IVPB)   Objective: Vitals:   08/12/21 1430  08/12/21 1500  BP: 124/62 134/68  Pulse: (!) 59 61  Resp:    Temp:    SpO2:      Intake/Output Summary (Last 24 hours) at 08/12/2021 1655 Last data filed at 08/12/2021 1300 Gross per 24 hour  Intake 600 ml  Output 0 ml  Net 600 ml   Filed Weights   08/10/21 0734 08/10/21 1045 08/11/21 2120  Weight: (!) 140.6 kg (!) 137.5 kg (!) 137.5 kg   Weight change: -3.098 kg Body mass index is 50.44 kg/m.   Physical Exam: General exam: Pleasant, middle-aged African-American female Skin: No rashes, lesions or ulcers. HEENT: Atraumatic, normocephalic, no obvious bleeding Lungs: Clear to auscultation bilaterally CVS: Regular rate and rhythm, no murmur GI/Abd soft, distended, nontender, bowel sound present CNS: Alert, awake, oriented x3 Psychiatry: Mood appropriate Extremities: Chronic lymphedema  Data Review: I have personally reviewed the laboratory data and studies available.  Recent Labs  Lab 08/06/21 1344 08/10/21 0752 08/12/21 0843  WBC 10.1 8.0 6.4  NEUTROABS  --   --  4.3  HGB 11.3* 10.5* 10.7*  HCT 34.6* 32.4* 33.5*  MCV 93.5 92.3 92.0  PLT 232 245 218   Recent Labs  Lab 08/07/21 0211 08/10/21 0752 08/12/21 0843  NA 132* 131* 132*  K 4.0 4.1 3.5  CL 93* 93* 95*  CO2 _0 GLUCOSE 95 104* 147*  BUN 37* 52* 49*  CREATININE 5.57* 6.44* 6.35*  CALCIUM 8.3* 7.9* 7.3*  PHOS  --  3.6  --     F/u labs ordered Unresulted Labs (From admission, onward)    None       Signed, Terrilee Croak, MD [facility] 08/12/2021

## 2021-08-13 ENCOUNTER — Inpatient Hospital Stay (HOSPITAL_COMMUNITY): Payer: 59

## 2021-08-13 DIAGNOSIS — I9589 Other hypotension: Secondary | ICD-10-CM | POA: Diagnosis not present

## 2021-08-13 HISTORY — PX: IR THROMBECTOMY AV FISTULA W/THROMBOLYSIS INC/SHUNT/IMG LEFT: IMG6105

## 2021-08-13 HISTORY — PX: IR US GUIDE VASC ACCESS LEFT: IMG2389

## 2021-08-13 LAB — GLUCOSE, CAPILLARY
Glucose-Capillary: 177 mg/dL — ABNORMAL HIGH (ref 70–99)
Glucose-Capillary: 204 mg/dL — ABNORMAL HIGH (ref 70–99)
Glucose-Capillary: 146 mg/dL — ABNORMAL HIGH (ref 70–99)
Glucose-Capillary: 360 mg/dL — ABNORMAL HIGH (ref 70–99)

## 2021-08-13 MED ORDER — IOHEXOL 240 MG/ML SOLN
50.0000 mL | Freq: Once | INTRAMUSCULAR | Status: AC | PRN
  Administered 2021-08-13: 25 mL via INTRAVENOUS

## 2021-08-13 MED ORDER — MIDAZOLAM HCL 2 MG/2ML IJ SOLN
INTRAMUSCULAR | Status: AC
  Filled 2021-08-13: qty 2

## 2021-08-13 MED ORDER — FENTANYL CITRATE (PF) 100 MCG/2ML IJ SOLN
INTRAMUSCULAR | Status: AC
  Filled 2021-08-13: qty 2

## 2021-08-13 MED ORDER — LIDOCAINE HCL 1 % IJ SOLN
INTRAMUSCULAR | Status: AC | PRN
Start: 2021-08-13 — End: 2021-08-13
  Administered 2021-08-13: 10 mL

## 2021-08-13 MED ORDER — LIDOCAINE HCL 1 % IJ SOLN
INTRAMUSCULAR | Status: AC
  Filled 2021-08-13: qty 20

## 2021-08-13 MED ORDER — PREDNISONE 50 MG PO TABS
50.0000 mg | ORAL_TABLET | ORAL | Status: AC
  Administered 2021-08-13: 50 mg via ORAL
  Filled 2021-08-13: qty 1

## 2021-08-13 NOTE — Progress Notes (Signed)
Occupational Therapy Treatment Patient Details Name: [name] MRN: [id number] DOB: 06/03/1966 Today's Date: 08/13/2021    History of present illness 55 y.o. female presenting from [facility] ED 7/3 with lethargy, hypotension and elevated lactic acid after d/c from [facility] earlier that day with septic shock and cellulitis. Patient found to have AMS, septic shock, acute respiratory failure, acute on chronic RV failure. Transferred to ICU on 7/12 due to hypotension. PMHX significant for obesity, DMII, COPD, CAD, lymphedema, ESRD on HD MWF and CHF.   OT comments  Session focused on progression of standing and ability to lift B LE in prep for ADL transfers. Pt continues to be limited by pain but very motivated to participate. Pt able to demo ability to stand with RW at [facility] A and take steps at bedside with Min A (x 2 bouts - rest break needed due to pain). Pt excited by progress and motivated to pivot to chair in next session. Will continue efforts with plans to address bari [facility] transfers in next sessions as well.   Follow Up Recommendations  SNF;LTACH;Supervision/Assistance - 24 hour    Equipment Recommendations  Hospital bed;3 in 1 bedside commode (bariatric)    Recommendations for Other Services      Precautions / Restrictions Precautions Precautions: Fall Precaution Comments: bil LE edema, L thigh wounds Restrictions Weight Bearing Restrictions: No       Mobility Bed Mobility Overal bed mobility: Needs Assistance Bed Mobility: Supine to Sit;Sit to Supine     Supine to sit: Modified independent (Device/Increase time);HOB elevated Sit to supine: Mod assist   General bed mobility comments: HOB elevated, good use of bed controls to sit EOB. Assist with BLE to get back in bed. Able to bend R knee to assist in scooting up in bed    Transfers Overall transfer level: Needs assistance Equipment used: Rolling walker (2 wheeled) Transfers: Sit to/from Stand Sit to Stand: Min assist          General transfer comment: Min A for sit to stand at bedside with RW (assist to stabilize RW) and take 2 sets of steps along bedside. limited by pain    Balance Overall balance assessment: Needs assistance Sitting-balance support: No upper extremity supported;Feet unsupported Sitting balance-Leahy Scale: Fair     Standing balance support: Bilateral upper extremity supported;During functional activity Standing balance-Leahy Scale: Poor Standing balance comment: heavily reliant on UE support in standing                           ADL either performed or assessed with clinical judgement   ADL Overall ADL's : Needs assistance/impaired                     Lower Body Dressing: Total assistance;+2 for physical assistance;+2 for safety/equipment;Sit to/from stand Lower Body Dressing Details (indicate cue type and reason): Total A for socks. Pt typically has assist with socks from husband, uses grabber/reacher, and long handle sponge               General ADL Comments: Focused on progression of standing with RW and taking steps along bedside today in prep for ADL transfers     Vision   Vision Assessment?: No apparent visual deficits   Perception     Praxis      Cognition Arousal/Alertness: Awake/alert Behavior During Therapy: WFL for tasks assessed/performed Overall Cognitive Status: Within Functional Limits for tasks assessed  Exercises     Shoulder Instructions       General Comments SpO2 96% on RA, HR 94 with activity    Pertinent Vitals/ Pain       Pain Assessment: 0-10 Pain Score: 9  Pain Location: L thigh wound, increases with WB Pain Descriptors / Indicators: Discomfort;Grimacing;Sore Pain Intervention(s): Monitored during session;Patient requesting pain meds-RN notified  Home Living                                          Prior Functioning/Environment               Frequency  Min 2X/week        Progress Toward Goals  OT Goals(current goals can now be found in the care plan section)  Progress towards OT goals: Progressing toward goals  Acute Rehab OT Goals Patient Stated Goal: get stronger and return home; pivot to chair next time OT Goal Formulation: With patient Time For Goal Achievement: 08/20/21 Potential to Achieve Goals: Good ADL Goals Pt Will Perform Grooming: sitting;with set-up Pt Will Perform Upper Body Dressing: with set-up;sitting Pt Will Transfer to Toilet: with max assist;with +2 assist;stand pivot transfer;bedside commode Pt Will Perform Toileting - Clothing Manipulation and hygiene: with mod assist;sit to/from stand;sitting/lateral leans;with adaptive equipment Pt/caregiver will Perform Home Exercise Program: Increased strength;Both right and left upper extremity;With theraband;With written HEP provided Additional ADL Goal #1: Pt will tolerate dyanmic EOB activities (sitting and standing) x 15 minutes with no more than min assist (in standing) with 0-1 hand support for progression to ADL performance.  Plan Discharge plan remains appropriate;Frequency remains appropriate    Co-evaluation                 AM-PAC OT "6 Clicks" Daily Activity     Outcome Measure   Help from another person eating meals?: None Help from another person taking care of personal grooming?: A Little Help from another person toileting, which includes using toliet, bedpan, or urinal?: Total Help from another person bathing (including washing, rinsing, drying)?: A Lot Help from another person to put on and taking off regular upper body clothing?: A Little Help from another person to put on and taking off regular lower body clothing?: Total 6 Click Score: 14    End of Session Equipment Utilized During Treatment: Rolling walker  OT Visit Diagnosis: Other abnormalities of gait and mobility (R26.89);Muscle weakness (generalized)  (M62.81);Pain Pain - Right/Left: Left Pain - part of body: Leg   Activity Tolerance Patient tolerated treatment well;Patient limited by pain   Patient Left in bed;with call bell/phone within reach   Nurse Communication Mobility status;Patient requests pain meds        Time: 6270-3500 OT Time Calculation (min): 20 min  Charges: OT General Charges $OT Visit: 1 Visit OT Treatments $Therapeutic Activity: 8-22 mins  Malachy Chamber, OTR/L Acute Rehab Services Office: [phone]    [name] 08/13/2021, 2:01 PM

## 2021-08-13 NOTE — Plan of Care (Signed)

## 2021-08-13 NOTE — Procedures (Signed)
Interventional Radiology Procedure:   Indications: Prolonged bleeding from left arm radiocephalic fistula  Procedure: Left arm fistulogram  Findings: Fistula is widely patent.  Cephalic vein is very tortuous in forearm but no focal stenosis.  Central veins are widely patent.  Complications: No immediate complications noted.     EBL: Minimal  Plan: Fistula is ok to use.    Madsen Riddle R. Anselm Pancoast, MD  Pager: [phone]

## 2021-08-13 NOTE — Progress Notes (Signed)
PROGRESS NOTE  [name]  DOB: 1966-08-17  PCP: Noreene Larsson, NP YYT:[id number]  DOA: 06/21/2021  LOS: 73 days  Hospital Day: 15   Chief Complaint  Patient presents with   Hypotension    Brief narrative: [name] is a 55 y.o. female with PMH significant for ESRD on HD, morbid obesity, DM2, HTN, CHF, chronic lymphedema, COPD. Patient presented to the ED on 06/21/2021 with complaint of right hip and leg pain with evidence of cellulitis.   She was started on empiric antibiotics but continued to have issues with hypotension with evidence of cardiogenic shock from biventricular failure and pulmonary hypertension.  She was managed in the ICU with vasopressors and hemodialysis. Currently blood pressure is controlled and is stable on midodrine.   Plastic surgery was consulted and patient is planned for I&D next week on 8/31.  Subjective: Patient was seen and examined this morning.  Sitting up in bed.  Not in distress.  No new symptoms.  Plan for fistulogram today.  Assessment/Plan: Cardiogenic shock Acute exacerbation of chronic right ventricular failure with pulmonary hypertension -patient required Vasopressor support in ICU.  Currently on midodrine.   -EF 55 to 60% on last echo from 7/15   ESRD-HD-MWF Chronic hypertension -Nephrology following.  Noted that inpatient dialysis have been challenging because of recurrent hypotension. Encouraged to try and prolong the duration of sitting in a recliner. -Continue midodrine.  Prolonged bleeding/oozing from AV fistula -Per nephrology note, patient is planned for fistulogram with IR today on 8/25, likely exacerbated by use of apixaban.  Left medial thigh wound -Seen by plastic surgery.  Noted the plan for wound debridement on 8/31.   Infected RUE midline catheter -Midline removed and patient treated with linezolid for 5 days. Resolved.   Atypical atrial flutter -Patient underwent DCCV on 7/15 with reversion to normal sinus  rhythm. -Continue amiodarone, Eliquis    Possible OSA/OHS Pulmonary hypertension -Started on CPAP during hospitalization -Continue CPAP with recommendation for outpatient sleep study   Fibromyalgia -Continue Cymbalta   Anemia of chronic disease -Hemoglobin stable. Recent Labs    07/21/21 0120 07/22/21 0104 07/24/21 2751 07/27/21 0703 07/29/21 0040 07/31/21 0037 08/04/21 0043 08/06/21 1344 08/10/21 0752 08/12/21 0843  HGB 11.1* 11.1* 10.7* 10.4* 9.9* 10.5* 11.0* 11.3* 10.5* 10.7*     Diabetes mellitus, type 2 -Hemoglobin A1c of 6.9%. Not on any antidiabetic at this time. -Continue SSI   Morbid obesity  -Body mass index is 48.87 kg/m. Patient has been advised to make an attempt to improve diet and exercise patterns to aid in weight loss.   Weakness PT/OT recommended SNF.     Pressure injury Right/posterior/proximal thigh. Not present on admission.   Mobility: PT eval obtained Code Status:   Code Status: Full Code  Nutritional status: Body mass index is 48.87 kg/m. Nutrition Problem: Increased nutrient needs Etiology: wound healing Signs/Symptoms: estimated needs Diet:  Diet Order             Diet NPO time specified Except for: Sips with Meds  Diet effective midnight                  DVT prophylaxis: apixaban (ELIQUIS) tablet 5 mg    Antimicrobials: None Fluid: None Consultants: Nephrology Family Communication: None at bedside  Status is: Inpatient  Remains inpatient appropriate because: Pending fistulogram today.  Pending I&D on 8/31  Dispo: The patient is from: Home  Anticipated d/c is to: SNF              Patient currently is not medically stable to d/c.   Difficult to place patient No     Infusions:   sodium chloride     promethazine (PHENERGAN) injection (IM or IVPB) 12.5 mg (08/08/21 0901)    Scheduled Meds:  amiodarone  200 mg Oral Daily   apixaban  5 mg Oral BID   Chlorhexidine Gluconate Cloth  6 each Topical  Q0600   cinacalcet  60 mg Oral Q M,W,F-HD   collagenase   Topical Daily   darbepoetin (ARANESP) injection - DIALYSIS  25 mcg Intravenous Q Wed-HD   diphenhydrAMINE  50 mg Oral Once   DULoxetine  20 mg Oral Daily   feeding supplement (NEPRO CARB STEADY)  237 mL Oral TID BM   insulin aspart  0-15 Units Subcutaneous TID WC   insulin aspart  0-5 Units Subcutaneous QHS   mouth rinse  15 mL Mouth Rinse BID   midodrine  15 mg Oral TID WC   multivitamin  1 tablet Oral QHS   pantoprazole  40 mg Oral BID   polyethylene glycol  17 g Oral Daily   senna  1 tablet Oral Daily    Antimicrobials: Anti-infectives (From admission, onward)    Start     Dose/Rate Route Frequency Ordered Stop   08/06/21 1530  piperacillin-tazobactam (ZOSYN) IVPB 2.25 g        2.25 g 100 mL/hr over 30 Minutes Intravenous Every 8 hours 08/06/21 1434 08/10/21 2347   07/16/21 2000  DAPTOmycin (CUBICIN) 500 mg in sodium chloride 0.9 % IVPB  Status:  Discontinued        6 mg/kg  87.4 kg (Adjusted) 120 mL/hr over 30 Minutes Intravenous Every 48 hours 07/15/21 1138 07/16/21 1043   07/16/21 1200  linezolid (ZYVOX) tablet 600 mg        600 mg Oral Every 12 hours 07/16/21 1101 07/21/21 2018   07/15/21 1000  ceFEPIme (MAXIPIME) 1 g in sodium chloride 0.9 % 100 mL IVPB  Status:  Discontinued        1 g 200 mL/hr over 30 Minutes Intravenous Every 24 hours 07/15/21 0913 07/16/21 1043   07/15/21 0900  cefTRIAXone (ROCEPHIN) 1 g in sodium chloride 0.9 % 100 mL IVPB  Status:  Discontinued        1 g 200 mL/hr over 30 Minutes Intravenous Every 24 hours 07/15/21 0752 07/15/21 0913   07/15/21 0500  DAPTOmycin (CUBICIN) 500 mg in sodium chloride 0.9 % IVPB  Status:  Discontinued        6 mg/kg  87.4 kg (Adjusted) 120 mL/hr over 30 Minutes Intravenous Every 48 hours 07/15/21 0404 07/15/21 1138   06/23/21 0930  DAPTOmycin (CUBICIN) 500 mg in sodium chloride 0.9 % IVPB        6 mg/kg  86.2 kg (Adjusted) 120 mL/hr over 30 Minutes  Intravenous Every 48 hours 06/23/21 0831 06/28/21 0130   06/23/21 0915  DAPTOmycin (CUBICIN) 800 mg in sodium chloride 0.9 % IVPB  Status:  Discontinued        6 mg/kg  130.1 kg 132 mL/hr over 30 Minutes Intravenous Every 48 hours 06/23/21 0825 06/23/21 0831   06/22/21 2200  ceFEPIme (MAXIPIME) 1 g in sodium chloride 0.9 % 100 mL IVPB  Status:  Discontinued        1 g 200 mL/hr over 30 Minutes Intravenous Every 24 hours 06/22/21 0001 06/23/21  0825   06/22/21 0800  metroNIDAZOLE (FLAGYL) IVPB 500 mg  Status:  Discontinued        500 mg 100 mL/hr over 60 Minutes Intravenous Every 8 hours 06/22/21 0716 06/23/21 0825   06/21/21 2230  doxycycline (VIBRAMYCIN) 100 mg in sodium chloride 0.9 % 250 mL IVPB  Status:  Discontinued        100 mg 125 mL/hr over 120 Minutes Intravenous Every 12 hours 06/21/21 2223 06/23/21 0825   06/21/21 2215  ceFEPIme (MAXIPIME) 2 g in sodium chloride 0.9 % 100 mL IVPB        2 g 200 mL/hr over 30 Minutes Intravenous  Once 06/21/21 2208 06/21/21 2245   06/21/21 2215  metroNIDAZOLE (FLAGYL) IVPB 500 mg        500 mg 100 mL/hr over 60 Minutes Intravenous  Once 06/21/21 2208 06/21/21 2340       PRN meds: acetaminophen, dextromethorphan-guaiFENesin, diphenhydrAMINE, docusate sodium, HYDROmorphone, ipratropium-albuterol, ondansetron (ZOFRAN) IV, polyethylene glycol, promethazine (PHENERGAN) injection (IM or IVPB)   Objective: Vitals:   08/13/21 0615 08/13/21 0912  BP: 128/74 (!) 141/77  Pulse: 65 71  Resp: 18 18  Temp: 98 F (36.7 C) 98.2 F (36.8 C)  SpO2: 93% 92%    Intake/Output Summary (Last 24 hours) at 08/13/2021 1039 Last data filed at 08/12/2021 2120 Gross per 24 hour  Intake 300 ml  Output 4000 ml  Net -3700 ml    Filed Weights   08/12/21 1335 08/12/21 1757 08/12/21 2120  Weight: (!) 137.5 kg 133.2 kg 133.2 kg   Weight change: -0.002 kg Body mass index is 48.87 kg/m.   Physical Exam: General exam: Pleasant, middle-aged African-American  female.  Morbidly obese Skin: No rashes, lesions or ulcers. HEENT: Atraumatic, normocephalic, no obvious bleeding Lungs: Clear to auscultate bilaterally CVS: Regular rate and rhythm, no murmur GI/Abd soft, distended, nontender, bowel sound present CNS: Alert, awake, oriented x3 Psychiatry: Mood appropriate Extremities: Chronic lymphedema  Data Review: I have personally reviewed the laboratory data and studies available.  Recent Labs  Lab 08/06/21 1344 08/10/21 0752 08/12/21 0843  WBC 10.1 8.0 6.4  NEUTROABS  --   --  4.3  HGB 11.3* 10.5* 10.7*  HCT 34.6* 32.4* 33.5*  MCV 93.5 92.3 92.0  PLT 232 245 218    Recent Labs  Lab 08/07/21 0211 08/10/21 0752 08/12/21 0843  NA 132* 131* 132*  K 4.0 4.1 3.5  CL 93* 93* 95*  CO2 _0 GLUCOSE 95 104* 147*  BUN 37* 52* 49*  CREATININE 5.57* 6.44* 6.35*  CALCIUM 8.3* 7.9* 7.3*  PHOS  --  3.6  --      F/u labs ordered Unresulted Labs (From admission, onward)    None       Signed, Terrilee Croak, MD [facility] 08/13/2021

## 2021-08-13 NOTE — NC FL2 (Signed)
[place] LEVEL OF CARE SCREENING TOOL     IDENTIFICATION  Patient Name: [name] Birthdate: 02/16/1966 Sex: female Admission Date (Current Location): 06/21/2021  [place] and [place] Number:  [place] [id number] [place] and Address:  [facility], [place] [address], [place],  [address]      Provider Number: [id number]  Attending Physician Name and Address:  Terrilee Croak, MD  Relative Name and Phone Number:  Lexey Fletes - husband, [phone]    Current Level of Care: Hospital Recommended Level of Care: [place] Prior Approval Number:    Date Approved/Denied:   PASRR Number: [id number]  Discharge Plan: SNF    Current Diagnoses: Patient Active Problem List   Diagnosis Date Noted   Pulmonary hypertension ([place])    Pressure injury of skin 07/05/2021   Hypotension 06/30/2021   Bilateral calf pain 06/30/2021   Cardiogenic shock ([place])    Cellulitis, leg 06/20/2021   COVID-19 02/03/2021   Enteritis, enteropathogenic E. coli 07/02/2020   Bacteremia 07/01/2020   Sepsis due to undetermined organism ([place]) 06/30/2020   Anemia, unspecified    Essential (primary) hypertension    Hyperlipidemia, unspecified    Type 2 diabetes mellitus with hyperglycemia ([place])    Chronic kidney disease, stage 5 ([place])    Chronic obstructive pulmonary disease, unspecified ([place])    Fibromyalgia    Hypertensive chronic kidney disease with stage 1 through stage 4 chronic kidney disease, or unspecified chronic kidney disease    Hypertensive chronic kidney disease with stage 5 chronic kidney disease or end stage renal disease ([place])    Lymphedema    Morbid (severe) obesity due to excess calories ([place])    Peritonitis, unspecified ([place])    Type 2 diabetes mellitus with diabetic nephropathy ([place])    Abdominal pain 04/20/2019   Peritonitis ([place]) 12/21/2018   Abdominal pain, LLQ 12/21/2018   Influenza B 12/21/2018   SOB (shortness  of breath) 06/27/2018   Hypokalemia 06/27/2018   Chronic renal failure    Shock liver 01/24/2017   Chronic right heart failure ([place]) 01/24/2017   Elevated troponin    Flu-like symptoms 01/18/2017   Fever 01/18/2017   HCAP (healthcare-associated pneumonia) 12/31/2016   Numbness and tingling in right hand 08/13/2016   Sepsis ([place]) 08/11/2016   Staphylococcus epidermidis bacteremia 06/11/2016   ESRD (end stage renal disease) ([place]) 06/06/2016   Chronic cholecystitis 12/09/2015   Protein-calorie malnutrition, severe 11/29/2015   Respiratory arrest ([place]) 11/28/2015   Cellulitis 11/13/2015   Recurrent cellulitis of lower leg 11/13/2015   Chronic diastolic CHF (congestive heart failure) ([place]) 11/13/2015   Chronic respiratory failure with hypoxia ([place]) 11/13/2015   ESRD on dialysis ([place]) 11/13/2015   Anemia of chronic disease 11/13/2015   Rotator cuff syndrome of right shoulder 05/30/2013   Pain in joint, shoulder region 05/30/2013   Obesity hypoventilation syndrome ([place]) 09/04/2012   Hyperkalemia 11/11/2011   Leukopenia 11/11/2011   HTN (hypertension) 11/11/2011   HLD (hyperlipidemia) 11/11/2011   Transaminitis 11/11/2011   Diabetes mellitus ([place]) 10/27/2011   Morbid obesity ([place]) 10/27/2011   Lymphedema of lower extremity 10/04/2011    Orientation RESPIRATION BLADDER Height & Weight     Self, Time, Situation, Place  O2 (4 Liters) Continent Weight: 293 lb 10.4 oz (133.2 kg) Height:  _0  (165.1 cm)  BEHAVIORAL SYMPTOMS/MOOD NEUROLOGICAL BOWEL NUTRITION STATUS      Continent Diet (Regular)  AMBULATORY STATUS COMMUNICATION OF NEEDS Skin   Total Care (Unable to ambulate  at this time due to pain with weight bearing) Verbally Other (Comment) (Unstageable wound anterior, left medial thigh; Cellulitis left upper leg)                       Personal Care Assistance Level of Assistance  Bathing, Feeding, Dressing Bathing Assistance: Maximum assistance Feeding assistance: Limited  assistance (Assistance with set-up) Dressing Assistance: Maximum assistance (Total assist with lower body dressing)     Functional Limitations Info  Sight, Hearing, Speech Sight Info: Adequate Hearing Info: Impaired Speech Info: Adequate    SPECIAL CARE FACTORS FREQUENCY  PT (By licensed PT), OT (By licensed OT)     PT Frequency: PT eval 7/6. PT at SNF eval and treat, a minimum of 5 days per week OT Frequency: OT eval 7/6. OT at SNF eval and treat, a minimum of 5 days per week            Contractures Contractures Info: Not present    Additional Factors Info  Allergies (Contrast Media (Iodinated Diagnostic Agents), Pneumococcal Vaccines, Vancomycin) Code Status Info: Full Allergies Info: Contrast Media (Iodinated Diagnostic Agents), Pneumococcal Vaccines, Vancomycin           Current Medications (08/13/2021):  This is the current hospital active medication list Current Facility-Administered Medications  Medication Dose Route Frequency Provider Last Rate Last Admin   0.9 %  sodium chloride infusion  250 mL Intravenous Continuous Candee Furbish, MD       acetaminophen (TYLENOL) tablet 650 mg  650 mg Oral Q6H PRN Gean Quint, MD   650 mg at 07/26/21 1557   amiodarone (PACERONE) tablet 200 mg  200 mg Oral Daily Clegg, Amy D, NP   200 mg at 08/13/21 5035   apixaban (ELIQUIS) tablet 5 mg  5 mg Oral BID Larey Dresser, MD   5 mg at 08/13/21 4656   Chlorhexidine Gluconate Cloth 2 % PADS 6 each  6 each Topical Q0600 Rosita Fire, MD   6 each at 08/11/21 0528   cinacalcet (SENSIPAR) tablet 60 mg  60 mg Oral Q M,W,F-HD Elmarie Shiley, MD   60 mg at 08/10/21 1311   collagenase (SANTYL) ointment   Topical Daily Little Ishikawa, MD   Given at 08/12/21 2216   Darbepoetin Alfa (ARANESP) injection 25 mcg  25 mcg Intravenous Q Wed-HD Elmarie Shiley, MD   25 mcg at 08/12/21 1630   dextromethorphan-guaiFENesin ([place] DM) 30-600 MG per 12 hr tablet 1 tablet  1 tablet Oral BID PRN  Amin, Ankit Chirag, MD       diphenhydrAMINE (BENADRYL) injection 25 mg  25 mg Intravenous Q6H PRN Magdalen Spatz, NP   25 mg at 06/30/21 2245   docusate sodium (COLACE) capsule 100 mg  100 mg Oral BID PRN Collier Bullock, MD   100 mg at 07/28/21 2157   DULoxetine (CYMBALTA) DR capsule 20 mg  20 mg Oral Daily Mick Sell, PA-C   20 mg at 08/13/21 8127   feeding supplement (NEPRO CARB STEADY) liquid 237 mL  237 mL Oral TID BM Amin, Ankit Chirag, MD 0 mL/hr at 08/04/21 2042 237 mL at 08/11/21 2147   HYDROmorphone (DILAUDID) tablet 1 mg  1 mg Oral Q4H PRN Little Ishikawa, MD   1 mg at 08/13/21 1019   insulin aspart (novoLOG) injection 0-15 Units  0-15 Units Subcutaneous TID WC Kris Mouton, RPH   5 Units at 08/13/21 1319   insulin aspart (novoLOG) injection  0-5 Units  0-5 Units Subcutaneous QHS Kris Mouton, [facility]       ipratropium-albuterol (DUONEB) 0.5-2.5 (3) MG/3ML nebulizer solution 3 mL  3 mL Nebulization Q4H PRN Amin, Ankit Chirag, MD       MEDLINE mouth rinse  15 mL Mouth Rinse BID Noemi Chapel P, DO   15 mL at 08/12/21 2221   midodrine (PROAMATINE) tablet 15 mg  15 mg Oral TID WC Larey Dresser, MD   15 mg at 08/13/21 1310   multivitamin (RENA-VIT) tablet 1 tablet  1 tablet Oral QHS Noemi Chapel P, DO   1 tablet at 08/12/21 2219   ondansetron (ZOFRAN) injection 4 mg  4 mg Intravenous Q6H PRN Noemi Chapel P, DO   4 mg at 08/11/21 0525   pantoprazole (PROTONIX) EC tablet 40 mg  40 mg Oral BID Little Ishikawa, MD   40 mg at 08/13/21 3545   polyethylene glycol (MIRALAX / GLYCOLAX) packet 17 g  17 g Oral Daily PRN Collier Bullock, MD       polyethylene glycol (MIRALAX / GLYCOLAX) packet 17 g  17 g Oral Daily Noemi Chapel P, DO   17 g at 07/31/21 6256   promethazine (PHENERGAN) 12.5 mg in sodium chloride 0.9 % 50 mL IVPB  12.5 mg Intravenous Q6H PRN Candee Furbish, MD 200 mL/hr at 08/08/21 0901 12.5 mg at 08/08/21 0901   senna (SENOKOT) tablet 8.6 mg  1 tablet Oral Daily Julian Hy, DO   8.6 mg at 08/13/21 3893     Discharge Medications: Please see discharge summary for a list of discharge medications.  Relevant Imaging Results:  Relevant Lab Results:   Additional Information: ss#[id number]. Patient has dialysis at [facility] MWF from 6:15 am to approx. 10:30 am. Patient has had the COVID vaccinations but not the booster.   Sable Feil, LCSW

## 2021-08-13 NOTE — Progress Notes (Signed)
[facility] NEPHROLOGY PROGRESS NOTE  Assessment/ Plan:  # Acute exacerbation of chronic right ventricular heart failure with pulmonary hypertension/shock: Remains on midodrine and as needed albumin at hemodialysis for hypotension.  Continue hemodialysis while maintaining systolic blood pressure >69.  # ESRD, MWF: Outpt [facility].  On schedule here but given high inpatient census plan on next treatment 8/27.  LFA AVF. 3K, 4L, AVF, 400/600  Encouraged to try and prolong her duration of sitting in a recliner.  #Prolonged bleeding/oozing from AV fistula: Some swelling as well.  Plan for fistulogram with IR 8/25.  Likely exacerbated by use of apixaban.  # Anemia: hb at goal, continue ESA. CTM.  # CKD-MBD: Continue Sensipar for PTH control.  Ca P have been ok, off of calcium acetate.  # Nutrition: Continue renal diet with oral nutritional supplementation.  # Hypotension/volumes: Blood pressures currently at goal, monitor with hemodialysis/ultrafiltration.  She uses midodrine  Subjective:  For fistulogram today Feels well, no complaints, no significant bleeding from the arm after dialysis 4 L ultrafiltration yesterday  Objective Vital signs in last 24 hours: Vitals:   08/12/21 1757 08/12/21 2120 08/13/21 0615 08/13/21 0912  BP: 129/64 129/88 128/74 (!) 141/77  Pulse: 65 67 65 71  Resp: _0 Temp: 98 F (36.7 C) 98.2 F (36.8 C) 98 F (36.7 C) 98.2 F (36.8 C)  TempSrc:  Oral Oral Oral  SpO2: 96% 100% 93% 92%  Weight: 133.2 kg 133.2 kg    Height:       Weight change: -0.002 kg  Intake/Output Summary (Last 24 hours) at 08/13/2021 1132 Last data filed at 08/12/2021 2120 Gross per 24 hour  Intake 300 ml  Output 4000 ml  Net -3700 ml        Labs: Basic Metabolic Panel: Recent Labs  Lab 08/07/21 0211 08/10/21 0752 08/12/21 0843  NA 132* 131* 132*  K 4.0 4.1 3.5  CL 93* 93* 95*  CO2 _1 GLUCOSE 95 104* 147*  BUN 37* 52* 49*   CREATININE 5.57* 6.44* 6.35*  CALCIUM 8.3* 7.9* 7.3*  PHOS  --  3.6  --     Liver Function Tests: Recent Labs  Lab 08/10/21 0752  ALBUMIN 3.4*    No results for input(s): LIPASE, AMYLASE in the last 168 hours. No results for input(s): AMMONIA in the last 168 hours. CBC: Recent Labs  Lab 08/06/21 1344 08/10/21 0752 08/12/21 0843  WBC 10.1 8.0 6.4  NEUTROABS  --   --  4.3  HGB 11.3* 10.5* 10.7*  HCT 34.6* 32.4* 33.5*  MCV 93.5 92.3 92.0  PLT 232 245 218    Cardiac Enzymes: No results for input(s): CKTOTAL, CKMB, CKMBINDEX, TROPONINI in the last 168 hours. CBG: Recent Labs  Lab 08/12/21 0544 08/12/21 1228 08/12/21 1854 08/12/21 2121 08/13/21 0619  GLUCAP 120* 113* 92 90 177*     Iron Studies: No results for input(s): IRON, TIBC, TRANSFERRIN, FERRITIN in the last 72 hours. Studies/Results: VAS US DUPLEX DIALYSIS ACCESS (AVF, AVG)  Result Date: 08/12/2021 DIALYSIS ACCESS Patient Name:  [name]  Date of Exam:   08/12/2021 Medical Rec #: [id number]         Accession #:    [id number] Date of Birth: 03-17-1966         Patient Gender: F Patient Age:   55 years Exam Location:  [facility] Procedure:      VAS US DUPLEX DIALYSIS ACCESS (AVF, AVG) Referring  Phys: Schae Cando --------------------------------------------------------------------------------  Reason for Exam: Excessive bleeding through AVF. Access Site: Left Upper Extremity. Access Type: Radial-cephalic AVF. Comparison Study: No prior study Performing Technologist: Maudry Mayhew MHA, RDMS, RVT, RDCS  Examination Guidelines: A complete evaluation includes B-mode imaging, spectral Doppler, color Doppler, and power Doppler as needed of all accessible portions of each vessel. Unilateral testing is considered an integral part of a complete examination. Limited examinations for reoccurring indications may be performed as noted.  Findings: +--------------------+----------+-----------------+--------+ AVF                  PSV (cm/s)Flow Vol (mL/min)Comments +--------------------+----------+-----------------+--------+ Native artery inflow   327           423                +--------------------+----------+-----------------+--------+ AVF Anastomosis        291                              +--------------------+----------+-----------------+--------+  +------------+----------+-------------+----------+----------+ OUTFLOW VEINPSV (cm/s)Diameter (cm)Depth (cm) Describe  +------------+----------+-------------+----------+----------+ AC Fossa                  0.89        0.39              +------------+----------+-------------+----------+----------+ Prox Forearm    72        2.11        0.21   aneurysmal +------------+----------+-------------+----------+----------+ Mid Forearm     40        0.90        0.18              +------------+----------+-------------+----------+----------+ Dist Forearm   375        0.83        0.15              +------------+----------+-------------+----------+----------+   Summary: Arteriovenous fistula-Velocities less than 100cm/s noted. Arteriovenous fistula-Aneurysmal dilatation noted. *See table(s) above for measurements and observations.  Diagnosing physician: Harold Barban MD Electronically signed by Harold Barban MD on 08/12/2021 at 8:13:25 PM.    --------------------------------------------------------------------------------   Final     Medications: Infusions:  sodium chloride     promethazine (PHENERGAN) injection (IM or IVPB) 12.5 mg (08/08/21 0901)    Scheduled Medications:  amiodarone  200 mg Oral Daily   apixaban  5 mg Oral BID   Chlorhexidine Gluconate Cloth  6 each Topical Q0600   cinacalcet  60 mg Oral Q M,W,F-HD   collagenase   Topical Daily   darbepoetin (ARANESP) injection - DIALYSIS  25 mcg Intravenous Q Wed-HD   diphenhydrAMINE  50 mg Oral Once   DULoxetine  20 mg Oral Daily   feeding supplement (NEPRO CARB STEADY)  237 mL  Oral TID BM   insulin aspart  0-15 Units Subcutaneous TID WC   insulin aspart  0-5 Units Subcutaneous QHS   mouth rinse  15 mL Mouth Rinse BID   midodrine  15 mg Oral TID WC   multivitamin  1 tablet Oral QHS   pantoprazole  40 mg Oral BID   polyethylene glycol  17 g Oral Daily   senna  1 tablet Oral Daily    have reviewed scheduled and prn medications.  Physical Exam: General:NAD, looks comfortable, lying in bed Heart:RRR, s1s2 nl Lungs: Distant breath sound bilateral, Abdomen:soft, Non-tender, non-distended Extremities: Some venous stasis changes and has a dressing on. Dialysis Access: Left AV fistula  has good thrill and bruit.  Itsel Opfer B Jene Huq 08/13/2021,11:32 AM  LOS: 53 days

## 2021-08-13 NOTE — Progress Notes (Signed)
PT Cancellation Note  Patient Details Name: [name] MRN: [id number] DOB: 25-Sep-1966   Cancelled Treatment:    Reason Eval/Treat Not Completed: Pain limiting ability to participate;Patient at procedure or test/unavailable. PT attempted to see pt twice today. Upon initial attempt pt finishing session with OT, reports too much pain to mobilize further with PT. Upon 2nd attempt pt off floor at procedure. PT will continue to follow as time allows.   Zenaida Niece 08/13/2021, 5:18 PM

## 2021-08-14 DIAGNOSIS — I9589 Other hypotension: Secondary | ICD-10-CM | POA: Diagnosis not present

## 2021-08-14 LAB — GLUCOSE, CAPILLARY
Glucose-Capillary: 264 mg/dL — ABNORMAL HIGH (ref 70–99)
Glucose-Capillary: 196 mg/dL — ABNORMAL HIGH (ref 70–99)
Glucose-Capillary: 117 mg/dL — ABNORMAL HIGH (ref 70–99)

## 2021-08-14 MED ORDER — MIDODRINE HCL 5 MG PO TABS
ORAL_TABLET | ORAL | Status: AC
  Administered 2021-08-14: 15 mg via ORAL
  Filled 2021-08-14: qty 3

## 2021-08-14 NOTE — Progress Notes (Signed)
Physical Therapy Treatment Patient Details Name: [name] MRN: [id number] DOB: 03/26/1966 Today's Date: 08/14/2021    History of Present Illness 55 y.o. female presenting from [place] ED 7/3 with lethargy, hypotension and elevated lactic acid after d/c from [facility] earlier that day with septic shock and cellulitis. Patient found to have AMS, septic shock, acute respiratory failure, acute on chronic RV failure. Transferred to ICU on 7/12 due to hypotension. PMHX significant for obesity, DMII, COPD, CAD, lymphedema, ESRD on HD MWF and CHF.    PT Comments    Pt demonstrating increased weakness and reduced tolerance for standing this session. During stand pivot transfer pt with knee buckling, requiring assist of PT and RN to prevent fall and sit on PT's knee. PT and 2 Rns then able to assist pt safely back into recliner. Pt will benefit from continued acute PT POC to progress transfer quality and reduce falls risk. PT continues to recommend SNF placement when medically stable.   Follow Up Recommendations  SNF     Equipment Recommendations  Hospital bed (hoyer lift)    Recommendations for Other Services       Precautions / Restrictions Precautions Precautions: Fall Precaution Comments: bil LE edema, L thigh wounds Restrictions Weight Bearing Restrictions: No    Mobility  Bed Mobility Overal bed mobility: Needs Assistance Bed Mobility: Rolling Rolling: Max assist   Supine to sit: Supervision;HOB elevated     General bed mobility comments: maxA rolling L/R in recliner due to tight quarters and no railings    Transfers Overall transfer level: Needs assistance Equipment used: Rolling walker (2 wheeled) Transfers: Sit to/from [facility] Sit to Stand: Mod assist Stand pivot transfers: Total assist       General transfer comment: during stand pivot transfer pt takes 3-4 steps toward HDU chair, in next step BLE buckle, PT and RN assist pt onto PT knee with PT able  to sit in recliner. Pt then assisted into standing and back into recliner in a seated position with +3 assistance. Staff assist with hoyer lift transfer from recliner to HDU chair following.  Ambulation/Gait Ambulation/Gait assistance: Min guard Gait Distance (Feet): 4 Feet Assistive device:  (bari RW) Gait Pattern/deviations: Step-to pattern;Wide base of support Gait velocity: reduced Gait velocity interpretation: <1.31 ft/sec, indicative of household ambulator General Gait Details: pt with widened BOS, short step length with significantly reduced gait speed   Stairs             Wheelchair Mobility    Modified Rankin (Stroke Patients Only)       Balance Overall balance assessment: Needs assistance Sitting-balance support: No upper extremity supported;Feet supported Sitting balance-Leahy Scale: Fair     Standing balance support: Bilateral upper extremity supported Standing balance-Leahy Scale: Zero Standing balance comment: heavily reliant on UE support, pt loses balance with knee buckling requiring totalA to prevent fall                            Cognition Arousal/Alertness: Awake/alert Behavior During Therapy: WFL for tasks assessed/performed Overall Cognitive Status: Within Functional Limits for tasks assessed                                        Exercises General Exercises - Lower Extremity Ankle Circles/Pumps: AROM;Both;20 reps Gluteal Sets: AROM;Both;10 reps Long Arc Quad: AROM;Both;20 reps Hip ABduction/ADduction: AROM;Both;20  reps    General Comments General comments (skin integrity, edema, etc.): vital signs stable      Pertinent Vitals/Pain Pain Assessment: Faces Faces Pain Scale: Hurts worst Pain Location: L thigh Pain Descriptors / Indicators: Burning Pain Intervention(s): Monitored during session    Home Living                      Prior Function            PT Goals (current goals can now be  found in the care plan section) Acute Rehab PT Goals Patient Stated Goal: get stronger and return home; pivot to chair next time Progress towards PT goals: Progressing toward goals    Frequency    Min 2X/week      PT Plan Current plan remains appropriate    Co-evaluation              AM-PAC PT "6 Clicks" Mobility   Outcome Measure  Help needed turning from your back to your side while in a flat bed without using bedrails?: A Little Help needed moving from lying on your back to sitting on the side of a flat bed without using bedrails?: A Little Help needed moving to and from a bed to a chair (including a wheelchair)?: A Lot Help needed standing up from a chair using your arms (e.g., wheelchair or bedside chair)?: Total Help needed to walk in hospital room?: Total Help needed climbing 3-5 steps with a railing? : Total 6 Click Score: 11    End of Session   Activity Tolerance: Patient tolerated treatment well Patient left: in chair;Other (comment) (pt leaving room with transport for HDU) Nurse Communication: Mobility status;Need for lift equipment (lift for transfers with nursing staff, will continue to perform transfer training with therapies) PT Visit Diagnosis: Other abnormalities of gait and mobility (R26.89);Muscle weakness (generalized) (M62.81);Pain Pain - Right/Left: Left Pain - part of body: Leg     Time: 9444-6190 PT Time Calculation (min) (ACUTE ONLY): 33 min  Charges:  $Gait Training: 8-22 mins $Therapeutic Activity: 23-37 mins                    Zenaida Niece, PT, DPT Acute Rehabilitation Pager: [place] 08/14/2021, 3:57 PM

## 2021-08-14 NOTE — TOC Progression Note (Signed)
Transition of Care [facility]) - Progression Note    Patient Details  Name: KERRI KOVACIK MRN: [id number] Date of Birth: 27-Mar-1966  Transition of Care [facility]) CM/SW Contact  Sharlet Salina Mila Homer, LCSW Phone Number: 08/14/2021, 1:47 PM  Clinical Narrative:  Progress notes reviewed and patient not medically stable for discharge yet. FL-2 has been updated and CSW will continue to follow patient's progress and facilitate facility placement (on 8/00 [facility] was chosen) once closer to readiness for discharge.      Expected Discharge Plan: Skilled Nursing Facility Barriers to Discharge: Continued Medical Work up, Ship broker  Expected Discharge Plan and Services Expected Discharge Plan: [place] In-house Referral: Clinical Social Work   Post Acute Care Choice: [place] Living arrangements for the past 2 months: Single Family Home                                       Social Determinants of Health (SDOH) Interventions Food Insecurity Interventions: Intervention Not Indicated Financial Strain Interventions: Intervention Not Indicated Housing Interventions: Intervention Not Indicated Transportation Interventions: Intervention Not Indicated  Readmission Risk Interventions Readmission Risk Prevention Plan 04/30/2019  Transportation Screening Complete  Medication Review Press photographer) Complete  PCP or Specialist appointment within 3-5 days of discharge Complete  HRI or [place] Complete  SW Recovery Care/Counseling Consult Complete  [place] Not Applicable  Some recent data might be hidden

## 2021-08-14 NOTE — Progress Notes (Signed)
Physical Therapy Treatment Patient Details Name: [name] MRN: [id number] DOB: 08/31/1966 Today's Date: 08/14/2021    History of Present Illness 55 y.o. female presenting from [place] ED 7/3 with lethargy, hypotension and elevated lactic acid after d/c from [facility] earlier that day with septic shock and cellulitis. Patient found to have AMS, septic shock, acute respiratory failure, acute on chronic RV failure. Transferred to ICU on 7/12 due to hypotension. PMHX significant for obesity, DMII, COPD, CAD, lymphedema, ESRD on HD MWF and CHF.    PT Comments    Pt tolerates treatment well, progressing to gait training away from the edge of bed. Pt continues to benefit from assist to stabilize walker and to power up from lower surfaces. Pt also fatigues quickly at this time. Pt will continue to benefit from aggressive mobilization and acute PT POC to improve mobility quality and reduce falls risk.   Follow Up Recommendations  SNF     Equipment Recommendations  Hospital bed (hoyer lift)    Recommendations for Other Services       Precautions / Restrictions Precautions Precautions: Fall Precaution Comments: bil LE edema, L thigh wounds Restrictions Weight Bearing Restrictions: No    Mobility  Bed Mobility Overal bed mobility: Needs Assistance Bed Mobility: Supine to Sit     Supine to sit: Supervision;HOB elevated     General bed mobility comments: increased time    Transfers Overall transfer level: Needs assistance Equipment used: Rolling walker (2 wheeled) Transfers: Sit to/from [facility] Sit to Stand: Min assist Stand pivot transfers: Min assist       General transfer comment: assist to stabilize walker in sit to stand and minA to power up from lower recliner surface. pt takes multiple small steps to turn from bed to recliner  Ambulation/Gait Ambulation/Gait assistance: Min guard Gait Distance (Feet): 4 Feet Assistive device:  (bari RW) Gait  Pattern/deviations: Step-to pattern;Wide base of support Gait velocity: reduced Gait velocity interpretation: <1.31 ft/sec, indicative of household ambulator General Gait Details: pt with widened BOS, short step length with significantly reduced gait speed   Stairs             Wheelchair Mobility    Modified Rankin (Stroke Patients Only)       Balance Overall balance assessment: Needs assistance Sitting-balance support: No upper extremity supported;Feet supported Sitting balance-Leahy Scale: Fair     Standing balance support: Bilateral upper extremity supported Standing balance-Leahy Scale: Poor Standing balance comment: heavily reliant on UE support in standing                            Cognition Arousal/Alertness: Awake/alert Behavior During Therapy: WFL for tasks assessed/performed Overall Cognitive Status: Within Functional Limits for tasks assessed                                        Exercises General Exercises - Lower Extremity Ankle Circles/Pumps: AROM;Both;20 reps Gluteal Sets: AROM;Both;10 reps Long Arc Quad: AROM;Both;20 reps Hip ABduction/ADduction: AROM;Both;20 reps    General Comments General comments (skin integrity, edema, etc.): VSS on RA      Pertinent Vitals/Pain Pain Assessment: Faces Faces Pain Scale: Hurts whole lot Pain Location: L thigh wound Pain Descriptors / Indicators: Grimacing Pain Intervention(s): Monitored during session    Home Living  Prior Function            PT Goals (current goals can now be found in the care plan section) Acute Rehab PT Goals Patient Stated Goal: get stronger and return home; pivot to chair next time Progress towards PT goals: Progressing toward goals    Frequency    Min 2X/week      PT Plan Current plan remains appropriate    Co-evaluation              AM-PAC PT "6 Clicks" Mobility   Outcome Measure  Help needed  turning from your back to your side while in a flat bed without using bedrails?: A Little Help needed moving from lying on your back to sitting on the side of a flat bed without using bedrails?: A Little Help needed moving to and from a bed to a chair (including a wheelchair)?: A Little Help needed standing up from a chair using your arms (e.g., wheelchair or bedside chair)?: A Little Help needed to walk in hospital room?: A Lot Help needed climbing 3-5 steps with a railing? : Total 6 Click Score: 15    End of Session   Activity Tolerance: Patient tolerated treatment well Patient left: in chair;with call bell/phone within reach Nurse Communication: Mobility status PT Visit Diagnosis: Other abnormalities of gait and mobility (R26.89);Muscle weakness (generalized) (M62.81);Pain Pain - Right/Left: Left Pain - part of body: Leg     Time: 5672-0919 PT Time Calculation (min) (ACUTE ONLY): 25 min  Charges:  $Gait Training: 8-22 mins $Therapeutic Activity: 8-22 mins                     Zenaida Niece, PT, DPT Acute Rehabilitation Pager: [place] 08/14/2021, 3:00 PM

## 2021-08-14 NOTE — Plan of Care (Signed)
  Problem: Education: Goal: Knowledge of General Education information will improve Description: Including pain rating scale, medication(s)/side effects and non-pharmacologic comfort measures Outcome: Completed/Met   Problem: Health Behavior/Discharge Planning: Goal: Ability to manage health-related needs will improve Outcome: Completed/Met

## 2021-08-14 NOTE — Progress Notes (Signed)
RT note. Patient cpap all set up for the night. Patient stated she Is able to place self on cpap when ready for bed. RT will continue to monitor

## 2021-08-14 NOTE — Progress Notes (Signed)
[facility] NEPHROLOGY PROGRESS NOTE  Assessment/ Plan:  # Acute exacerbation of chronic right ventricular heart failure with pulmonary hypertension/shock: Remains on midodrine and as needed albumin at hemodialysis for hypotension.  Continue hemodialysis while maintaining systolic blood pressure >49.  # ESRD, MWF: Outpt [facility].  On schedule here but given high inpatient census plan on next treatment today or tomorrow .  LFA AVF. 3K, 4L, AVF, 400/600  Encouraged to try and prolong her duration of sitting in a recliner.  #Prolonged bleeding/oozing from AV fistula: Some swelling as well.  Normal fistulogram 8/25.  Likely 2/2  apixaban.  Discussed reduced dosing with pharmacy  # Anemia: hb at goal, continue ESA. CTM.  # CKD-MBD: Continue Sensipar for PTH control.  Ca P have been ok, off of calcium acetate.  # Nutrition: Continue renal diet with oral nutritional supplementation.  # Hypotension/volumes: Blood pressures currently at goal, monitor with hemodialysis/ultrafiltration.  She uses midodrine  #Prolonged bleeding from AV fistula  Subjective:  Fistulogram yesterday was widely patent, no stenosis Patient in good spirits today  Objective Vital signs in last 24 hours: Vitals:   08/13/21 2122 08/13/21 2330 08/14/21 0454 08/14/21 0951  BP: 136/79  133/71 129/69  Pulse: 61 62  62  Resp: _0 Temp: 97.9 F (36.6 C)  97.6 F (36.4 C) 98 F (36.7 C)  TempSrc: Oral  Oral Oral  SpO2: 95% 96% 96% 99%  Weight: 133.2 kg     Height:       Weight change: -4.3 kg  Intake/Output Summary (Last 24 hours) at 08/14/2021 1204 Last data filed at 08/14/2021 1791 Gross per 24 hour  Intake 400 ml  Output 0 ml  Net 400 ml        Labs: Basic Metabolic Panel: Recent Labs  Lab 08/10/21 0752 08/12/21 0843  NA 131* 132*  K 4.1 3.5  CL 93* 95*  CO2 23 24  GLUCOSE 104* 147*  BUN 52* 49*  CREATININE 6.44* 6.35*  CALCIUM 7.9* 7.3*  PHOS 3.6  --      Liver Function Tests: Recent Labs  Lab 08/10/21 0752  ALBUMIN 3.4*    No results for input(s): LIPASE, AMYLASE in the last 168 hours. No results for input(s): AMMONIA in the last 168 hours. CBC: Recent Labs  Lab 08/10/21 0752 08/12/21 0843  WBC 8.0 6.4  NEUTROABS  --  4.3  HGB 10.5* 10.7*  HCT 32.4* 33.5*  MCV 92.3 92.0  PLT 245 218    Cardiac Enzymes: No results for input(s): CKTOTAL, CKMB, CKMBINDEX, TROPONINI in the last 168 hours. CBG: Recent Labs  Lab 08/13/21 1253 08/13/21 1605 08/13/21 2121 08/14/21 0648 08/14/21 1138  GLUCAP 204* 146* 360* 264* 196*     Iron Studies: No results for input(s): IRON, TIBC, TRANSFERRIN, FERRITIN in the last 72 hours. Studies/Results: IR THROMBECTOMY AV FISTULA W/THROMBOLYSIS INC/SHUNT/IMG LEFT  Result Date: 08/13/2021 INDICATION: 55 year old with end-stage renal disease and left radiocephalic fistula. Patient has prolonged bleeding after dialysis. EXAM: 1. Left upper extremity fistulogram 2. Ultrasound guidance for vascular access MEDICATIONS: Local anesthetic, 1% lidocaine ANESTHESIA/SEDATION: None FLUOROSCOPY TIME:  Fluoroscopy Time: 1 minute, 30 seconds, 26 mGy CONTRAST:  50 mL Omnipaque 505 COMPLICATIONS: None immediate. PROCEDURE: Informed written consent was obtained from the patient after a thorough discussion of the procedural risks, benefits and alternatives. All questions were addressed. Maximal Sterile Barrier Technique was utilized including caps, mask, sterile gowns, sterile gloves, sterile drape, hand hygiene and skin antiseptic.  A timeout was performed prior to the initiation of the procedure. Patient was placed supine on the interventional table. The left forearm was prepped and draped in sterile fashion. Ultrasound confirmed a patent radiocephalic fistula. Skin was anesthetized along the distal forearm. Using ultrasound guidance, 21 gauge needle was directed into the distal forearm cephalic vein and micropuncture  dilator set was placed. Multiple fistulogram images were obtained. The catheter was removed with manual compression at the end of the procedure. FINDINGS: Left cephalic vein is aneurysm and tortuous in the mid and proximal forearm. Cephalic vein is mildly tortuous but widely patent in the upper arm. There is flow into the brachial veins and probably basilic vein in the left upper arm. Left axillary vein is widely patent. Central veins are widely patent. Arterial anastomosis is widely patent. No focal stenosis. IMPRESSION: Left arm AV fistula is widely patent. Draining cephalic vein is aneurysmal and tortuous in the forearm. No focal areas of stenosis. ACCESS: This access remains amenable to future percutaneous interventions as clinically indicated. Electronically Signed   By: Markus Daft M.D.   On: 08/13/2021 18:24    Medications: Infusions:  sodium chloride     promethazine (PHENERGAN) injection (IM or IVPB) 12.5 mg (08/08/21 0901)    Scheduled Medications:  amiodarone  200 mg Oral Daily   apixaban  5 mg Oral BID   cinacalcet  60 mg Oral Q M,W,F-HD   collagenase   Topical Daily   darbepoetin (ARANESP) injection - DIALYSIS  25 mcg Intravenous Q Wed-HD   DULoxetine  20 mg Oral Daily   feeding supplement (NEPRO CARB STEADY)  237 mL Oral TID BM   insulin aspart  0-15 Units Subcutaneous TID WC   insulin aspart  0-5 Units Subcutaneous QHS   mouth rinse  15 mL Mouth Rinse BID   midodrine  15 mg Oral TID WC   multivitamin  1 tablet Oral QHS   pantoprazole  40 mg Oral BID   polyethylene glycol  17 g Oral Daily   senna  1 tablet Oral Daily    have reviewed scheduled and prn medications.  Physical Exam: General:NAD, looks comfortable, lying in bed Heart:RRR, s1s2 nl Lungs: Distant breath sound bilateral, Abdomen:soft, Non-tender, non-distended Extremities: Some venous stasis changes and has a dressing on. Dialysis Access: Left AV fistula has good thrill and bruit.  Rexene Agent 08/14/2021,12:04 PM  LOS: 54 days

## 2021-08-14 NOTE — Progress Notes (Signed)
PROGRESS NOTE  [name]  DOB: [birth year]  PCP: Noreene Larsson, NP JSH:[id number]  DOA: 06/21/2021  LOS: 62 days  Hospital Day: 89   Chief Complaint  Patient presents with   Hypotension    Brief narrative: [name] is a 55 y.o. female with PMH significant for ESRD on HD, morbid obesity, DM2, HTN, CHF, chronic lymphedema, COPD. Patient presented to the ED on 06/21/2021 with complaint of right hip and leg pain with evidence of cellulitis.   She was started on empiric antibiotics but continued to have issues with hypotension with evidence of cardiogenic shock from biventricular failure and pulmonary hypertension.  She was managed in the ICU with vasopressors and hemodialysis. Currently blood pressure is controlled and is stable on midodrine.   Plastic surgery was consulted and patient is planned for I&D next week on 8/31.  Subjective: Patient was seen and examined this morning.  Sitting up in bed.  Not in distress.  No new symptoms.  Underwent fistulogram yesterday.  Blood sugar level was elevated over 200 this morning  Assessment/Plan: Cardiogenic shock Acute exacerbation of chronic right ventricular failure with pulmonary hypertension -patient required Vasopressor support in ICU.  Currently on midodrine.   -EF 55 to 60% on last echo from 7/15   ESRD-HD-MWF Chronic hypertension -Nephrology following.  Noted that inpatient dialysis have been challenging because of recurrent hypotension. Encouraged to try and prolong the duration of sitting in a recliner. -Continue midodrine.  Prolonged bleeding/oozing from AV fistula -8/25, patient underwent fistulogram by IR.  No longer bleeding at this time.  Continue apixaban.  If rate starts to bleed, need to cut back on apixaban from 5 mg to 2.5 mg.    Left medial thigh wound -Seen by plastic surgery.  Noted the plan for wound debridement on 8/31.   Infected RUE midline catheter -Midline removed and patient treated with  linezolid for 5 days. Resolved.   Atypical atrial flutter -Patient underwent DCCV on 7/15 with reversion to normal sinus rhythm. -Continue amiodarone, Eliquis    Possible OSA/OHS Pulmonary hypertension -Started on CPAP during hospitalization -Continue CPAP with recommendation for outpatient sleep study   Fibromyalgia -Continue Cymbalta   Anemia of chronic disease -Hemoglobin stable. Recent Labs    07/21/21 0120 07/22/21 0104 07/24/21 8502 07/27/21 0703 07/29/21 0040 07/31/21 0037 08/04/21 0043 08/06/21 1344 08/10/21 0752 08/12/21 0843  HGB 11.1* 11.1* 10.7* 10.4* 9.9* 10.5* 11.0* 11.3* 10.5* 10.7*     Diabetes mellitus, type 2 Hyperglycemia -Hemoglobin A1c of 6.9%. Not on any antidiabetic at this time. -On 8/25, patient received prednisone prior to fistulogram because of allergy to dye.  Subsequent blood sugar level was elevated.  No longer getting prednisone.  Continue sliding scale insulin   Morbid obesity  -Body mass index is 48.87 kg/m. Patient has been advised to make an attempt to improve diet and exercise patterns to aid in weight loss.   Weakness PT/OT recommended SNF.     Pressure injury Right/posterior/proximal thigh. Not present on admission.   Mobility: PT eval obtained Code Status:   Code Status: Full Code  Nutritional status: Body mass index is 48.87 kg/m. Nutrition Problem: Increased nutrient needs Etiology: wound healing Signs/Symptoms: estimated needs Diet:  Diet Order             Diet renal/carb modified with fluid restriction Diet-HS Snack? Nothing; Fluid restriction: 1200 mL Fluid; Room service appropriate? Yes; Fluid consistency: Thin  Diet effective now  DVT prophylaxis: apixaban (ELIQUIS) tablet 5 mg    Antimicrobials: None Fluid: None Consultants: Nephrology Family Communication: None at bedside  Status is: Inpatient  Remains inpatient appropriate because: Pending fistulogram today.  Pending I&D on  8/31  Dispo: The patient is from: Home              Anticipated d/c is to: SNF              Patient currently is not medically stable to d/c.   Difficult to place patient No     Infusions:   sodium chloride     promethazine (PHENERGAN) injection (IM or IVPB) 12.5 mg (08/08/21 0901)    Scheduled Meds:  amiodarone  200 mg Oral Daily   apixaban  5 mg Oral BID   cinacalcet  60 mg Oral Q M,W,F-HD   collagenase   Topical Daily   darbepoetin (ARANESP) injection - DIALYSIS  25 mcg Intravenous Q Wed-HD   DULoxetine  20 mg Oral Daily   feeding supplement (NEPRO CARB STEADY)  237 mL Oral TID BM   insulin aspart  0-15 Units Subcutaneous TID WC   insulin aspart  0-5 Units Subcutaneous QHS   mouth rinse  15 mL Mouth Rinse BID   midodrine  15 mg Oral TID WC   multivitamin  1 tablet Oral QHS   pantoprazole  40 mg Oral BID   polyethylene glycol  17 g Oral Daily   senna  1 tablet Oral Daily    Antimicrobials: Anti-infectives (From admission, onward)    Start     Dose/Rate Route Frequency Ordered Stop   08/06/21 1530  piperacillin-tazobactam (ZOSYN) IVPB 2.25 g        2.25 g 100 mL/hr over 30 Minutes Intravenous Every 8 hours 08/06/21 1434 08/10/21 2347   07/16/21 2000  DAPTOmycin (CUBICIN) 500 mg in sodium chloride 0.9 % IVPB  Status:  Discontinued        6 mg/kg  87.4 kg (Adjusted) 120 mL/hr over 30 Minutes Intravenous Every 48 hours 07/15/21 1138 07/16/21 1043   07/16/21 1200  linezolid (ZYVOX) tablet 600 mg        600 mg Oral Every 12 hours 07/16/21 1101 07/21/21 2018   07/15/21 1000  ceFEPIme (MAXIPIME) 1 g in sodium chloride 0.9 % 100 mL IVPB  Status:  Discontinued        1 g 200 mL/hr over 30 Minutes Intravenous Every 24 hours 07/15/21 0913 07/16/21 1043   07/15/21 0900  cefTRIAXone (ROCEPHIN) 1 g in sodium chloride 0.9 % 100 mL IVPB  Status:  Discontinued        1 g 200 mL/hr over 30 Minutes Intravenous Every 24 hours 07/15/21 0752 07/15/21 0913   07/15/21 0500  DAPTOmycin  (CUBICIN) 500 mg in sodium chloride 0.9 % IVPB  Status:  Discontinued        6 mg/kg  87.4 kg (Adjusted) 120 mL/hr over 30 Minutes Intravenous Every 48 hours 07/15/21 0404 07/15/21 1138   06/23/21 0930  DAPTOmycin (CUBICIN) 500 mg in sodium chloride 0.9 % IVPB        6 mg/kg  86.2 kg (Adjusted) 120 mL/hr over 30 Minutes Intravenous Every 48 hours 06/23/21 0831 06/28/21 0130   06/23/21 0915  DAPTOmycin (CUBICIN) 800 mg in sodium chloride 0.9 % IVPB  Status:  Discontinued        6 mg/kg  130.1 kg 132 mL/hr over 30 Minutes Intravenous Every 48 hours 06/23/21 0825 06/23/21 0831  06/22/21 2200  ceFEPIme (MAXIPIME) 1 g in sodium chloride 0.9 % 100 mL IVPB  Status:  Discontinued        1 g 200 mL/hr over 30 Minutes Intravenous Every 24 hours 06/22/21 0001 06/23/21 0825   06/22/21 0800  metroNIDAZOLE (FLAGYL) IVPB 500 mg  Status:  Discontinued        500 mg 100 mL/hr over 60 Minutes Intravenous Every 8 hours 06/22/21 0716 06/23/21 0825   06/21/21 2230  doxycycline (VIBRAMYCIN) 100 mg in sodium chloride 0.9 % 250 mL IVPB  Status:  Discontinued        100 mg 125 mL/hr over 120 Minutes Intravenous Every 12 hours 06/21/21 2223 06/23/21 0825   06/21/21 2215  ceFEPIme (MAXIPIME) 2 g in sodium chloride 0.9 % 100 mL IVPB        2 g 200 mL/hr over 30 Minutes Intravenous  Once 06/21/21 2208 06/21/21 2245   06/21/21 2215  metroNIDAZOLE (FLAGYL) IVPB 500 mg        500 mg 100 mL/hr over 60 Minutes Intravenous  Once 06/21/21 2208 06/21/21 2340       PRN meds: acetaminophen, dextromethorphan-guaiFENesin, diphenhydrAMINE, docusate sodium, HYDROmorphone, ipratropium-albuterol, ondansetron (ZOFRAN) IV, polyethylene glycol, promethazine (PHENERGAN) injection (IM or IVPB)   Objective: Vitals:   08/14/21 0454 08/14/21 0951  BP: 133/71 129/69  Pulse:  62  Resp: 20 16  Temp: 97.6 F (36.4 C) 98 F (36.7 C)  SpO2: 96% 99%    Intake/Output Summary (Last 24 hours) at 08/14/2021 1509 Last data filed at  08/14/2021 1610 Gross per 24 hour  Intake 400 ml  Output 0 ml  Net 400 ml    Filed Weights   08/12/21 1757 08/12/21 2120 08/13/21 2122  Weight: 133.2 kg 133.2 kg 133.2 kg   Weight change: -4.3 kg Body mass index is 48.87 kg/m.   Physical Exam: General exam: Pleasant, middle-aged African-American female.  Morbidly obese Skin: No rashes, lesions or ulcers. HEENT: Atraumatic, normocephalic, no obvious bleeding Lungs: Clear to auscultation bilaterally CVS: Regular rate and rhythm, no murmur GI/Abd soft, distended, nontender, bowel sound present CNS: Alert, awake, oriented x3 Psychiatry: Mood appropriate Extremities: Chronic lymphedema  Data Review: I have personally reviewed the laboratory data and studies available.  Recent Labs  Lab 08/10/21 0752 08/12/21 0843  WBC 8.0 6.4  NEUTROABS  --  4.3  HGB 10.5* 10.7*  HCT 32.4* 33.5*  MCV 92.3 92.0  PLT 245 218    Recent Labs  Lab 08/10/21 0752 08/12/21 0843  NA 131* 132*  K 4.1 3.5  CL 93* 95*  CO2 23 24  GLUCOSE 104* 147*  BUN 52* 49*  CREATININE 6.44* 6.35*  CALCIUM 7.9* 7.3*  PHOS 3.6  --      F/u labs ordered Unresulted Labs (From admission, onward)    None       Signed, Terrilee Croak, MD [facility] 08/14/2021

## 2021-08-15 DIAGNOSIS — I9589 Other hypotension: Secondary | ICD-10-CM | POA: Diagnosis not present

## 2021-08-15 LAB — GLUCOSE, CAPILLARY
Glucose-Capillary: 139 mg/dL — ABNORMAL HIGH (ref 70–99)
Glucose-Capillary: 131 mg/dL — ABNORMAL HIGH (ref 70–99)
Glucose-Capillary: 186 mg/dL — ABNORMAL HIGH (ref 70–99)
Glucose-Capillary: 158 mg/dL — ABNORMAL HIGH (ref 70–99)

## 2021-08-15 MED ORDER — CINACALCET HCL 30 MG PO TABS
30.0000 mg | ORAL_TABLET | ORAL | Status: DC
  Administered 2021-08-17 – 2021-08-24 (×3): 30 mg via ORAL
  Filled 2021-08-15 (×3): qty 1

## 2021-08-15 NOTE — Plan of Care (Signed)
  Problem: Clinical Measurements: Goal: Ability to maintain clinical measurements within normal limits will improve Outcome: Progressing   Problem: Activity: Goal: Risk for activity intolerance will decrease Outcome: Progressing   Problem: Safety: Goal: Ability to remain free from injury will improve Outcome: Progressing

## 2021-08-15 NOTE — Plan of Care (Signed)
  Problem: Clinical Measurements: Goal: Will remain free from infection Outcome: Completed/Met Goal: Diagnostic test results will improve Outcome: Completed/Met Goal: Respiratory complications will improve Outcome: Completed/Met Goal: Cardiovascular complication will be avoided Outcome: Completed/Met   Problem: Nutrition: Goal: Adequate nutrition will be maintained Outcome: Completed/Met   Problem: Coping: Goal: Level of anxiety will decrease Outcome: Completed/Met   Problem: Elimination: Goal: Will not experience complications related to bowel motility Outcome: Completed/Met

## 2021-08-15 NOTE — Progress Notes (Signed)
PROGRESS NOTE  [name]  DOB: [birth year]  PCP: Noreene Larsson, NP KJI:[id number]  DOA: 06/21/2021  LOS: 53 days  Hospital Day: 28   Chief Complaint  Patient presents with   Hypotension    Brief narrative: [name] is a 55 y.o. female with PMH significant for ESRD on HD, morbid obesity, DM2, HTN, CHF, chronic lymphedema, COPD. Patient presented to the ED on 06/21/2021 with complaint of right hip and leg pain with evidence of cellulitis.   She was started on empiric antibiotics but continued to have issues with hypotension with evidence of cardiogenic shock from biventricular failure and pulmonary hypertension.  She was managed in the ICU with vasopressors and hemodialysis. Currently blood pressure is controlled and is stable on midodrine.   Plastic surgery was consulted and patient is planned for I&D next week on 8/31.  Subjective: Patient was seen and examined this morning. Sitting up in bed.  Not in distress.  No new symptoms.  Blood sugar level stabilizing.    Assessment/Plan: Cardiogenic shock -resolved Acute exacerbation of chronic right ventricular failure with pulmonary hypertension -patient required Vasopressor support in ICU.  Currently on midodrine.   -EF 55 to 60% on last echo from 7/15   ESRD-HD-MWF Chronic hypertension -Nephrology following.  Noted that inpatient dialysis have been challenging because of recurrent hypotension. Encouraged to try and prolong the duration of sitting in a recliner. -Continue midodrine.  Prolonged bleeding/oozing from AV fistula -8/25, patient underwent fistulogram by IR.  Did have tortuous AV fistula but no evidence of bleeding.  Continue apixaban.  If she starts to bleed, need to cut back on apixaban from 5 mg to 2.5 mg.    Left medial thigh wound -Seen by plastic surgery.  Noted the plan for wound debridement on 8/31.   Infected RUE midline catheter -Midline removed and patient treated with linezolid for 5 days.  Resolved.   Atypical atrial flutter -Patient underwent DCCV on 7/15 with reversion to normal sinus rhythm. -Continue amiodarone, Eliquis    Possible OSA/OHS Pulmonary hypertension -Started on CPAP during hospitalization -Continue CPAP with recommendation for outpatient sleep study   Fibromyalgia -Continue Cymbalta   Anemia of chronic disease -Hemoglobin stable. Recent Labs    07/21/21 0120 07/22/21 0104 07/24/21 7737 07/27/21 0703 07/29/21 0040 07/31/21 0037 08/04/21 0043 08/06/21 1344 08/10/21 0752 08/12/21 0843  HGB 11.1* 11.1* 10.7* 10.4* 9.9* 10.5* 11.0* 11.3* 10.5* 10.7*     Diabetes mellitus, type 2 Hyperglycemia -Hemoglobin A1c of 6.9%. Not on any antidiabetic at this time. -On 8/25, patient received prednisone prior to fistulogram because of allergy to dye.  Subsequent blood sugar level was elevated.  No longer getting prednisone.  Blood sugar level stabilizing.  Continue sliding scale insulin   Morbid obesity  -Body mass index is 48.87 kg/m. Patient has been advised to make an attempt to improve diet and exercise patterns to aid in weight loss.   Weakness PT/OT recommended SNF.     Pressure injury Right/posterior/proximal thigh. Not present on admission.   Mobility: PT eval obtained Code Status:   Code Status: Full Code  Nutritional status: Body mass index is 48.87 kg/m. Nutrition Problem: Increased nutrient needs Etiology: wound healing Signs/Symptoms: estimated needs Diet:  Diet Order             Diet renal/carb modified with fluid restriction Diet-HS Snack? Nothing; Fluid restriction: 1200 mL Fluid; Room service appropriate? Yes; Fluid consistency: Thin  Diet effective now  DVT prophylaxis: apixaban (ELIQUIS) tablet 5 mg    Antimicrobials: None Fluid: None Consultants: Nephrology Family Communication: None at bedside  Status is: Inpatient  Remains inpatient appropriate because - Pending I&D on 8/31  Dispo: The  patient is from: Home              Anticipated d/c is to: SNF              Patient currently is not medically stable to d/c.   Difficult to place patient No     Infusions:   sodium chloride     promethazine (PHENERGAN) injection (IM or IVPB) 12.5 mg (08/08/21 0901)    Scheduled Meds:  amiodarone  200 mg Oral Daily   apixaban  5 mg Oral BID   [START ON 08/17/2021] cinacalcet  30 mg Oral Q M,W,F-HD   collagenase   Topical Daily   darbepoetin (ARANESP) injection - DIALYSIS  25 mcg Intravenous Q Wed-HD   DULoxetine  20 mg Oral Daily   feeding supplement (NEPRO CARB STEADY)  237 mL Oral TID BM   insulin aspart  0-15 Units Subcutaneous TID WC   insulin aspart  0-5 Units Subcutaneous QHS   mouth rinse  15 mL Mouth Rinse BID   midodrine  15 mg Oral TID WC   multivitamin  1 tablet Oral QHS   pantoprazole  40 mg Oral BID   polyethylene glycol  17 g Oral Daily   senna  1 tablet Oral Daily    Antimicrobials: Anti-infectives (From admission, onward)    Start     Dose/Rate Route Frequency Ordered Stop   08/06/21 1530  piperacillin-tazobactam (ZOSYN) IVPB 2.25 g        2.25 g 100 mL/hr over 30 Minutes Intravenous Every 8 hours 08/06/21 1434 08/10/21 2347   07/16/21 2000  DAPTOmycin (CUBICIN) 500 mg in sodium chloride 0.9 % IVPB  Status:  Discontinued        6 mg/kg  87.4 kg (Adjusted) 120 mL/hr over 30 Minutes Intravenous Every 48 hours 07/15/21 1138 07/16/21 1043   07/16/21 1200  linezolid (ZYVOX) tablet 600 mg        600 mg Oral Every 12 hours 07/16/21 1101 07/21/21 2018   07/15/21 1000  ceFEPIme (MAXIPIME) 1 g in sodium chloride 0.9 % 100 mL IVPB  Status:  Discontinued        1 g 200 mL/hr over 30 Minutes Intravenous Every 24 hours 07/15/21 0913 07/16/21 1043   07/15/21 0900  cefTRIAXone (ROCEPHIN) 1 g in sodium chloride 0.9 % 100 mL IVPB  Status:  Discontinued        1 g 200 mL/hr over 30 Minutes Intravenous Every 24 hours 07/15/21 0752 07/15/21 0913   07/15/21 0500  DAPTOmycin  (CUBICIN) 500 mg in sodium chloride 0.9 % IVPB  Status:  Discontinued        6 mg/kg  87.4 kg (Adjusted) 120 mL/hr over 30 Minutes Intravenous Every 48 hours 07/15/21 0404 07/15/21 1138   06/23/21 0930  DAPTOmycin (CUBICIN) 500 mg in sodium chloride 0.9 % IVPB        6 mg/kg  86.2 kg (Adjusted) 120 mL/hr over 30 Minutes Intravenous Every 48 hours 06/23/21 0831 06/28/21 0130   06/23/21 0915  DAPTOmycin (CUBICIN) 800 mg in sodium chloride 0.9 % IVPB  Status:  Discontinued        6 mg/kg  130.1 kg 132 mL/hr over 30 Minutes Intravenous Every 48 hours 06/23/21 0825 06/23/21 0831  06/22/21 2200  ceFEPIme (MAXIPIME) 1 g in sodium chloride 0.9 % 100 mL IVPB  Status:  Discontinued        1 g 200 mL/hr over 30 Minutes Intravenous Every 24 hours 06/22/21 0001 06/23/21 0825   06/22/21 0800  metroNIDAZOLE (FLAGYL) IVPB 500 mg  Status:  Discontinued        500 mg 100 mL/hr over 60 Minutes Intravenous Every 8 hours 06/22/21 0716 06/23/21 0825   06/21/21 2230  doxycycline (VIBRAMYCIN) 100 mg in sodium chloride 0.9 % 250 mL IVPB  Status:  Discontinued        100 mg 125 mL/hr over 120 Minutes Intravenous Every 12 hours 06/21/21 2223 06/23/21 0825   06/21/21 2215  ceFEPIme (MAXIPIME) 2 g in sodium chloride 0.9 % 100 mL IVPB        2 g 200 mL/hr over 30 Minutes Intravenous  Once 06/21/21 2208 06/21/21 2245   06/21/21 2215  metroNIDAZOLE (FLAGYL) IVPB 500 mg        500 mg 100 mL/hr over 60 Minutes Intravenous  Once 06/21/21 2208 06/21/21 2340       PRN meds: acetaminophen, dextromethorphan-guaiFENesin, diphenhydrAMINE, docusate sodium, HYDROmorphone, ipratropium-albuterol, ondansetron (ZOFRAN) IV, polyethylene glycol, promethazine (PHENERGAN) injection (IM or IVPB)   Objective: Vitals:   08/14/21 1930 08/15/21 0533  BP: 129/65 101/67  Pulse: (!) 59 (!) 105  Resp: 14 18  Temp: 97.9 F (36.6 C) 97.9 F (36.6 C)  SpO2:  95%    Intake/Output Summary (Last 24 hours) at 08/15/2021 1200 Last data  filed at 08/15/2021 0730 Gross per 24 hour  Intake 1137 ml  Output 4000 ml  Net -2863 ml    Filed Weights   08/13/21 2122 08/14/21 1555 08/14/21 2100  Weight: 133.2 kg 133.2 kg 133.2 kg   Weight change: 0 kg Body mass index is 48.87 kg/m.   Physical Exam: General exam: Pleasant, middle-aged African-American female.  Morbidly obese Skin: No rashes, lesions or ulcers. HEENT: Atraumatic, normocephalic, no obvious bleeding Lungs: Clear to auscultation bilaterally CVS: Regular rate and rhythm, no murmur GI/Abd soft, distended, nontender, bowel sound present CNS: Alert, awake, oriented x3 Psychiatry: Mood appropriate Extremities: Chronic lymphedema  Data Review: I have personally reviewed the laboratory data and studies available.  Recent Labs  Lab 08/10/21 0752 08/12/21 0843  WBC 8.0 6.4  NEUTROABS  --  4.3  HGB 10.5* 10.7*  HCT 32.4* 33.5*  MCV 92.3 92.0  PLT 245 218    Recent Labs  Lab 08/10/21 0752 08/12/21 0843  NA 131* 132*  K 4.1 3.5  CL 93* 95*  CO2 23 24  GLUCOSE 104* 147*  BUN 52* 49*  CREATININE 6.44* 6.35*  CALCIUM 7.9* 7.3*  PHOS 3.6  --      F/u labs ordered Unresulted Labs (From admission, onward)    None       Signed, Terrilee Croak, MD [facility] 08/15/2021

## 2021-08-15 NOTE — Progress Notes (Signed)
Una boots and wound change completed  patient tolerated well.  Gave pain medication prior.

## 2021-08-15 NOTE — Progress Notes (Signed)
[facility] NEPHROLOGY PROGRESS NOTE  Assessment/ Plan:  # Acute exacerbation of chronic right ventricular heart failure with pulmonary hypertension/shock: Remains on midodrine and as needed albumin at hemodialysis for hypotension.  Continue hemodialysis while maintaining systolic blood pressure >10.  # ESRD, MWF: Outpt [facility].  On schedule.  LFA AVF. 3K, 4L, AVF, 400/600.  Cont HD in chair as able  #Prolonged bleeding/oozing from AV fistula: Some swelling as well.  Normal fistulogram 8/25.  Likely 2/2  apixaban.  CTM, not occurred last 2 Tx.  # Anemia: hb at goal, continue ESA. CTM.  # CKD-MBD: Sensipar reduced 2/2 hypocalcemia. P Ok.  No binder currently  # Nutrition: Continue renal diet with oral nutritional supplementation.  # Hypotension/volumes: Blood pressures currently at goal, monitor with hemodialysis/ultrafiltration.  She uses midodrine   Subjective:  HD yesterday, 4 L ultrafiltration, completed the entirety and chair/recliner Walked in room yesterday  Objective Vital signs in last 24 hours: Vitals:   08/14/21 1900 08/14/21 1930 08/14/21 2100 08/15/21 0533  BP: 135/74 129/65  101/67  Pulse: (!) 57 (!) 59  (!) 105  Resp: _0 Temp:  97.9 F (36.6 C)  97.9 F (36.6 C)  TempSrc:    Oral  SpO2:    95%  Weight:   133.2 kg   Height:       Weight change: 0 kg  Intake/Output Summary (Last 24 hours) at 08/15/2021 1030 Last data filed at 08/15/2021 0730 Gross per 24 hour  Intake 1137 ml  Output 4000 ml  Net -2863 ml        Labs: Basic Metabolic Panel: Recent Labs  Lab 08/10/21 0752 08/12/21 0843  NA 131* 132*  K 4.1 3.5  CL 93* 95*  CO2 23 24  GLUCOSE 104* 147*  BUN 52* 49*  CREATININE 6.44* 6.35*  CALCIUM 7.9* 7.3*  PHOS 3.6  --     Liver Function Tests: Recent Labs  Lab 08/10/21 0752  ALBUMIN 3.4*    No results for input(s): LIPASE, AMYLASE in the last 168 hours. No results for input(s): AMMONIA in the last  168 hours. CBC: Recent Labs  Lab 08/10/21 0752 08/12/21 0843  WBC 8.0 6.4  NEUTROABS  --  4.3  HGB 10.5* 10.7*  HCT 32.4* 33.5*  MCV 92.3 92.0  PLT 245 218    Cardiac Enzymes: No results for input(s): CKTOTAL, CKMB, CKMBINDEX, TROPONINI in the last 168 hours. CBG: Recent Labs  Lab 08/13/21 2121 08/14/21 0648 08/14/21 1138 08/14/21 2048 08/15/21 0642  GLUCAP 360* 264* 196* 117* 139*     Iron Studies: No results for input(s): IRON, TIBC, TRANSFERRIN, FERRITIN in the last 72 hours. Studies/Results: IR US Guide Vasc Access Left  Result Date: 08/14/2021 INDICATION: 55 year old with end-stage renal disease and left radiocephalic fistula. Patient has prolonged bleeding after dialysis. EXAM: 1. Left upper extremity fistulogram 2. Ultrasound guidance for vascular access MEDICATIONS: Local anesthetic, 1% lidocaine ANESTHESIA/SEDATION: None FLUOROSCOPY TIME:  Fluoroscopy Time: 1 minute, 30 seconds, 26 mGy CONTRAST:  50 mL Omnipaque 932 COMPLICATIONS: None immediate. PROCEDURE: Informed written consent was obtained from the patient after a thorough discussion of the procedural risks, benefits and alternatives. All questions were addressed. Maximal Sterile Barrier Technique was utilized including caps, mask, sterile gowns, sterile gloves, sterile drape, hand hygiene and skin antiseptic. A timeout was performed prior to the initiation of the procedure. Patient was placed supine on the interventional table. The left forearm was prepped and draped in sterile  fashion. Ultrasound confirmed a patent radiocephalic fistula. Skin was anesthetized along the distal forearm. Using ultrasound guidance, 21 gauge needle was directed into the distal forearm cephalic vein and micropuncture dilator set was placed. Multiple fistulogram images were obtained. The catheter was removed with manual compression at the end of the procedure. FINDINGS: Left cephalic vein is aneurysm and tortuous in the mid and proximal  forearm. Cephalic vein is mildly tortuous but widely patent in the upper arm. There is flow into the brachial veins and probably basilic vein in the left upper arm. Left axillary vein is widely patent. Central veins are widely patent. Arterial anastomosis is widely patent. No focal stenosis. IMPRESSION: Left arm AV fistula is widely patent. Draining cephalic vein is aneurysmal and tortuous in the forearm. No focal areas of stenosis. ACCESS: This access remains amenable to future percutaneous interventions as clinically indicated. Electronically Signed   By: Markus Daft M.D.   On: 08/13/2021 18:24   IR THROMBECTOMY AV FISTULA W/THROMBOLYSIS INC/SHUNT/IMG LEFT  Result Date: 08/13/2021 INDICATION: 55 year old with end-stage renal disease and left radiocephalic fistula. Patient has prolonged bleeding after dialysis. EXAM: 1. Left upper extremity fistulogram 2. Ultrasound guidance for vascular access MEDICATIONS: Local anesthetic, 1% lidocaine ANESTHESIA/SEDATION: None FLUOROSCOPY TIME:  Fluoroscopy Time: 1 minute, 30 seconds, 26 mGy CONTRAST:  50 mL Omnipaque 749 COMPLICATIONS: None immediate. PROCEDURE: Informed written consent was obtained from the patient after a thorough discussion of the procedural risks, benefits and alternatives. All questions were addressed. Maximal Sterile Barrier Technique was utilized including caps, mask, sterile gowns, sterile gloves, sterile drape, hand hygiene and skin antiseptic. A timeout was performed prior to the initiation of the procedure. Patient was placed supine on the interventional table. The left forearm was prepped and draped in sterile fashion. Ultrasound confirmed a patent radiocephalic fistula. Skin was anesthetized along the distal forearm. Using ultrasound guidance, 21 gauge needle was directed into the distal forearm cephalic vein and micropuncture dilator set was placed. Multiple fistulogram images were obtained. The catheter was removed with manual compression at  the end of the procedure. FINDINGS: Left cephalic vein is aneurysm and tortuous in the mid and proximal forearm. Cephalic vein is mildly tortuous but widely patent in the upper arm. There is flow into the brachial veins and probably basilic vein in the left upper arm. Left axillary vein is widely patent. Central veins are widely patent. Arterial anastomosis is widely patent. No focal stenosis. IMPRESSION: Left arm AV fistula is widely patent. Draining cephalic vein is aneurysmal and tortuous in the forearm. No focal areas of stenosis. ACCESS: This access remains amenable to future percutaneous interventions as clinically indicated. Electronically Signed   By: Markus Daft M.D.   On: 08/13/2021 18:24    Medications: Infusions:  sodium chloride     promethazine (PHENERGAN) injection (IM or IVPB) 12.5 mg (08/08/21 0901)    Scheduled Medications:  amiodarone  200 mg Oral Daily   apixaban  5 mg Oral BID   cinacalcet  60 mg Oral Q M,W,F-HD   collagenase   Topical Daily   darbepoetin (ARANESP) injection - DIALYSIS  25 mcg Intravenous Q Wed-HD   DULoxetine  20 mg Oral Daily   feeding supplement (NEPRO CARB STEADY)  237 mL Oral TID BM   insulin aspart  0-15 Units Subcutaneous TID WC   insulin aspart  0-5 Units Subcutaneous QHS   mouth rinse  15 mL Mouth Rinse BID   midodrine  15 mg Oral TID WC   multivitamin  1 tablet Oral QHS   pantoprazole  40 mg Oral BID   polyethylene glycol  17 g Oral Daily   senna  1 tablet Oral Daily    have reviewed scheduled and prn medications.  Physical Exam: General:NAD, looks comfortable, lying in bed Heart:RRR, s1s2 nl Lungs: Distant breath sound bilateral, Abdomen:soft, Non-tender, non-distended Extremities: Some venous stasis changes and has wraps on. Dialysis Access: Left AV fistula has good thrill and bruit.  Ikechukwu Cerny B Nic Lampe 08/15/2021,10:30 AM  LOS: 55 days

## 2021-08-15 NOTE — Progress Notes (Signed)
Paged ortho tech regarding [employer]

## 2021-08-15 NOTE — Plan of Care (Signed)
  Problem: Pain Managment: Goal: General experience of comfort will improve Outcome: Progressing   Problem: Fluid Volume: Goal: Compliance with measures to maintain balanced fluid volume will improve Outcome: Progressing

## 2021-08-15 NOTE — Progress Notes (Signed)
Pt states she is able to place CPAP on herself when ready for bed. CPAP machine moved within pt's reach, and mask placed on bed beside her. Advised pt to notify for RT if any further assistance is needed.

## 2021-08-15 NOTE — Progress Notes (Signed)
Orthopedic Tech Progress Note Patient Details:  [name] Jan 04, 1966 [id number]  Ortho Devices Type of Ortho Device: Louretta Parma boot Ortho Device/Splint Location: Bi LE Ortho Device/Splint Interventions: Application   Post Interventions Patient Tolerated: Well Instructions Provided: Care of device  Vonnie Ligman E Margaret Staggs 08/15/2021, 11:45 AM

## 2021-08-16 DIAGNOSIS — I9589 Other hypotension: Secondary | ICD-10-CM | POA: Diagnosis not present

## 2021-08-16 LAB — GLUCOSE, CAPILLARY
Glucose-Capillary: 120 mg/dL — ABNORMAL HIGH (ref 70–99)
Glucose-Capillary: 150 mg/dL — ABNORMAL HIGH (ref 70–99)
Glucose-Capillary: 153 mg/dL — ABNORMAL HIGH (ref 70–99)
Glucose-Capillary: 110 mg/dL — ABNORMAL HIGH (ref 70–99)

## 2021-08-16 MED ORDER — METOPROLOL TARTRATE 12.5 MG HALF TABLET
12.5000 mg | ORAL_TABLET | Freq: Two times a day (BID) | ORAL | Status: DC
  Administered 2021-08-16 – 2021-08-24 (×13): 12.5 mg via ORAL
  Filled 2021-08-16 (×17): qty 1

## 2021-08-16 NOTE — Progress Notes (Signed)
[facility] NEPHROLOGY PROGRESS NOTE  Assessment/ Plan:  # Acute exacerbation of chronic right ventricular heart failure with pulmonary hypertension/shock: Remains on midodrine and as needed albumin at hemodialysis for hypotension.  Continue hemodialysis while maintaining systolic blood pressure >27.  # ESRD, MWF: Outpt [facility].  On schedule.  LFA AVF. 3K, 4L, AVF, 400/600.  Cont HD in chair as able. No heparin (on DOAC)  #Prolonged bleeding/oozing from AV fistula: Some swelling as well.  Normal fistulogram 8/25.  Likely 2/2  apixaban.  CTM, not occurred last 2 Tx.  # Anemia: hb at goal, continue ESA. CTM.  # CKD-MBD: Sensipar reduced 2/2 hypocalcemia. P Ok.  No binder currently  # Nutrition: Continue renal diet with oral nutritional supplementation.  # Hypotension/volumes: Blood pressures currently at goal, monitor with hemodialysis/ultrafiltration.  She uses midodrine   Subjective:  C/o some nausea and palptations this AM, HR elevated; TRH eval  Objective Vital signs in last 24 hours: Vitals:   08/15/21 2126 08/16/21 0641 08/16/21 0900 08/16/21 1013  BP: 106/78 96/65 114/73 125/85  Pulse: 100 85 (!) 105 (!) 117  Resp: _0 Temp: 98.5 F (36.9 C) 98 F (36.7 C) 98 F (36.7 C) 98.1 F (36.7 C)  TempSrc: Oral Oral Oral Oral  SpO2: 98% 95% 100% 98%  Weight:      Height:       Weight change:   Intake/Output Summary (Last 24 hours) at 08/16/2021 1145 Last data filed at 08/16/2021 0803 Gross per 24 hour  Intake 900 ml  Output 0 ml  Net 900 ml        Labs: Basic Metabolic Panel: Recent Labs  Lab 08/10/21 0752 08/12/21 0843  NA 131* 132*  K 4.1 3.5  CL 93* 95*  CO2 23 24  GLUCOSE 104* 147*  BUN 52* 49*  CREATININE 6.44* 6.35*  CALCIUM 7.9* 7.3*  PHOS 3.6  --     Liver Function Tests: Recent Labs  Lab 08/10/21 0752  ALBUMIN 3.4*    No results for input(s): LIPASE, AMYLASE in the last 168 hours. No results for  input(s): AMMONIA in the last 168 hours. CBC: Recent Labs  Lab 08/10/21 0752 08/12/21 0843  WBC 8.0 6.4  NEUTROABS  --  4.3  HGB 10.5* 10.7*  HCT 32.4* 33.5*  MCV 92.3 92.0  PLT 245 218    Cardiac Enzymes: No results for input(s): CKTOTAL, CKMB, CKMBINDEX, TROPONINI in the last 168 hours. CBG: Recent Labs  Lab 08/15/21 0642 08/15/21 1149 08/15/21 1724 08/15/21 2126 08/16/21 0640  GLUCAP 139* 131* 186* 158* 120*     Iron Studies: No results for input(s): IRON, TIBC, TRANSFERRIN, FERRITIN in the last 72 hours. Studies/Results: No results found.  Medications: Infusions:  sodium chloride     promethazine (PHENERGAN) injection (IM or IVPB) 12.5 mg (08/08/21 0901)    Scheduled Medications:  amiodarone  200 mg Oral Daily   apixaban  5 mg Oral BID   [START ON 08/17/2021] cinacalcet  30 mg Oral Q M,W,F-HD   collagenase   Topical Daily   darbepoetin (ARANESP) injection - DIALYSIS  25 mcg Intravenous Q Wed-HD   DULoxetine  20 mg Oral Daily   feeding supplement (NEPRO CARB STEADY)  237 mL Oral TID BM   insulin aspart  0-15 Units Subcutaneous TID WC   insulin aspart  0-5 Units Subcutaneous QHS   mouth rinse  15 mL Mouth Rinse BID   metoprolol tartrate  12.5 mg Oral  BID   midodrine  15 mg Oral TID WC   multivitamin  1 tablet Oral QHS   pantoprazole  40 mg Oral BID   polyethylene glycol  17 g Oral Daily   senna  1 tablet Oral Daily    have reviewed scheduled and prn medications.  Physical Exam: General:NAD, looks comfortable, lying in bed Heart:RRR, s1s2 nl Lungs: Distant breath sound bilateral, Abdomen:soft, Non-tender, non-distended Extremities: Some venous stasis changes and has wraps on. Dialysis Access: Left AV fistula has good thrill and bruit.  Gonzalo Waymire B Kamilla Hands 08/16/2021,11:45 AM  LOS: 56 days

## 2021-08-16 NOTE — Progress Notes (Signed)
PROGRESS NOTE  [name]  DOB: 1965-12-22  PCP: Noreene Larsson, NP UOH:[id number]  DOA: 06/21/2021  LOS: 3 days  Hospital Day: 59   Chief Complaint  Patient presents with   Hypotension    Brief narrative: [name] is a 55 y.o. female with PMH significant for ESRD on HD, morbid obesity, DM2, HTN, CHF, chronic lymphedema, COPD. Patient presented to the ED on 06/21/2021 with complaint of right hip and leg pain with evidence of cellulitis.   She was started on empiric antibiotics but continued to have issues with hypotension with evidence of cardiogenic shock from biventricular failure and pulmonary hypertension.  She was managed in the ICU with vasopressors and hemodialysis. Currently blood pressure is controlled and is stable on midodrine.   Plastic surgery was consulted and patient is planned for I&D next week on 8/31.  Subjective: Patient was seen and examined this morning. Sitting up in bed.  Not in distress.  No new symptoms.  She has been having intermittent tachycardia this morning.  Blood sugar level stable.  Assessment/Plan: Cardiogenic shock -resolved Acute exacerbation of chronic right ventricular failure with pulmonary hypertension -patient required Vasopressor support in ICU.  Currently on midodrine.   -EF 55 to 60% on last echo from 7/15   ESRD-HD-MWF Chronic hypertension -Nephrology following.  Noted that inpatient dialysis have been challenging because of recurrent hypotension. Encouraged to try and prolong the duration of sitting in a recliner. -Continue midodrine.  Prolonged bleeding/oozing from AV fistula -8/25, patient underwent fistulogram by IR.  Did have tortuous AV fistula but no evidence of bleeding.  Continue apixaban.  If she starts to bleed, need to cut back on apixaban from 5 mg to 2.5 mg.    Left medial thigh wound -Seen by plastic surgery.  Noted the plan for wound debridement on 8/31.   Infected RUE midline catheter -Midline removed  and patient treated with linezolid for 5 days. Resolved.   Atypical atrial flutter -Patient underwent DCCV on 7/15 with reversion to normal sinus rhythm. -She is having intermittent tachycardia this morning.  Continue amiodarone.  Metoprolol 12.5 mg twice daily added today.  Continue Eliquis for anticoagulation.    Possible OSA/OHS Pulmonary hypertension -Started on CPAP during hospitalization -Continue CPAP with recommendation for outpatient sleep study   Fibromyalgia -Continue Cymbalta   Anemia of chronic disease -Hemoglobin stable. Recent Labs    07/21/21 0120 07/22/21 0104 07/24/21 5208 07/27/21 0703 07/29/21 0040 07/31/21 0037 08/04/21 0043 08/06/21 1344 08/10/21 0752 08/12/21 0843  HGB 11.1* 11.1* 10.7* 10.4* 9.9* 10.5* 11.0* 11.3* 10.5* 10.7*     Diabetes mellitus, type 2 -Hemoglobin A1c of 6.9%. Not on any antidiabetic at this time. -Level is better in last 24 hours.  Continue to monitor. Recent Labs  Lab 08/15/21 0642 08/15/21 1149 08/15/21 1724 08/15/21 2126 08/16/21 0640  GLUCAP 139* 131* 186* 158* 120*    Morbid obesity  -Body mass index is 48.87 kg/m. Patient has been advised to make an attempt to improve diet and exercise patterns to aid in weight loss.   Weakness PT/OT recommended SNF.     Pressure injury Right/posterior/proximal thigh. Not present on admission.   Mobility: PT eval obtained Code Status:   Code Status: Full Code  Nutritional status: Body mass index is 48.87 kg/m. Nutrition Problem: Increased nutrient needs Etiology: wound healing Signs/Symptoms: estimated needs Diet:  Diet Order             Diet renal/carb modified with fluid restriction Diet-HS  Snack? Nothing; Fluid restriction: 1200 mL Fluid; Room service appropriate? Yes; Fluid consistency: Thin  Diet effective now                  DVT prophylaxis: apixaban (ELIQUIS) tablet 5 mg    Antimicrobials: None Fluid: None Consultants: Nephrology Family  Communication: None at bedside  Status is: Inpatient  Remains inpatient appropriate because - Pending I&D on 8/31  Dispo: The patient is from: Home              Anticipated d/c is to: SNF              Patient currently is not medically stable to d/c.   Difficult to place patient No     Infusions:   sodium chloride     promethazine (PHENERGAN) injection (IM or IVPB) 12.5 mg (08/08/21 0901)    Scheduled Meds:  amiodarone  200 mg Oral Daily   apixaban  5 mg Oral BID   [START ON 08/17/2021] cinacalcet  30 mg Oral Q M,W,F-HD   collagenase   Topical Daily   darbepoetin (ARANESP) injection - DIALYSIS  25 mcg Intravenous Q Wed-HD   DULoxetine  20 mg Oral Daily   feeding supplement (NEPRO CARB STEADY)  237 mL Oral TID BM   insulin aspart  0-15 Units Subcutaneous TID WC   insulin aspart  0-5 Units Subcutaneous QHS   mouth rinse  15 mL Mouth Rinse BID   metoprolol tartrate  12.5 mg Oral BID   midodrine  15 mg Oral TID WC   multivitamin  1 tablet Oral QHS   pantoprazole  40 mg Oral BID   polyethylene glycol  17 g Oral Daily   senna  1 tablet Oral Daily    Antimicrobials: Anti-infectives (From admission, onward)    Start     Dose/Rate Route Frequency Ordered Stop   08/06/21 1530  piperacillin-tazobactam (ZOSYN) IVPB 2.25 g        2.25 g 100 mL/hr over 30 Minutes Intravenous Every 8 hours 08/06/21 1434 08/10/21 2347   07/16/21 2000  DAPTOmycin (CUBICIN) 500 mg in sodium chloride 0.9 % IVPB  Status:  Discontinued        6 mg/kg  87.4 kg (Adjusted) 120 mL/hr over 30 Minutes Intravenous Every 48 hours 07/15/21 1138 07/16/21 1043   07/16/21 1200  linezolid (ZYVOX) tablet 600 mg        600 mg Oral Every 12 hours 07/16/21 1101 07/21/21 2018   07/15/21 1000  ceFEPIme (MAXIPIME) 1 g in sodium chloride 0.9 % 100 mL IVPB  Status:  Discontinued        1 g 200 mL/hr over 30 Minutes Intravenous Every 24 hours 07/15/21 0913 07/16/21 1043   07/15/21 0900  cefTRIAXone (ROCEPHIN) 1 g in sodium  chloride 0.9 % 100 mL IVPB  Status:  Discontinued        1 g 200 mL/hr over 30 Minutes Intravenous Every 24 hours 07/15/21 0752 07/15/21 0913   07/15/21 0500  DAPTOmycin (CUBICIN) 500 mg in sodium chloride 0.9 % IVPB  Status:  Discontinued        6 mg/kg  87.4 kg (Adjusted) 120 mL/hr over 30 Minutes Intravenous Every 48 hours 07/15/21 0404 07/15/21 1138   06/23/21 0930  DAPTOmycin (CUBICIN) 500 mg in sodium chloride 0.9 % IVPB        6 mg/kg  86.2 kg (Adjusted) 120 mL/hr over 30 Minutes Intravenous Every 48 hours 06/23/21 0831 06/28/21 0130  06/23/21 0915  DAPTOmycin (CUBICIN) 800 mg in sodium chloride 0.9 % IVPB  Status:  Discontinued        6 mg/kg  130.1 kg 132 mL/hr over 30 Minutes Intravenous Every 48 hours 06/23/21 0825 06/23/21 0831   06/22/21 2200  ceFEPIme (MAXIPIME) 1 g in sodium chloride 0.9 % 100 mL IVPB  Status:  Discontinued        1 g 200 mL/hr over 30 Minutes Intravenous Every 24 hours 06/22/21 0001 06/23/21 0825   06/22/21 0800  metroNIDAZOLE (FLAGYL) IVPB 500 mg  Status:  Discontinued        500 mg 100 mL/hr over 60 Minutes Intravenous Every 8 hours 06/22/21 0716 06/23/21 0825   06/21/21 2230  doxycycline (VIBRAMYCIN) 100 mg in sodium chloride 0.9 % 250 mL IVPB  Status:  Discontinued        100 mg 125 mL/hr over 120 Minutes Intravenous Every 12 hours 06/21/21 2223 06/23/21 0825   06/21/21 2215  ceFEPIme (MAXIPIME) 2 g in sodium chloride 0.9 % 100 mL IVPB        2 g 200 mL/hr over 30 Minutes Intravenous  Once 06/21/21 2208 06/21/21 2245   06/21/21 2215  metroNIDAZOLE (FLAGYL) IVPB 500 mg        500 mg 100 mL/hr over 60 Minutes Intravenous  Once 06/21/21 2208 06/21/21 2340       PRN meds: acetaminophen, dextromethorphan-guaiFENesin, diphenhydrAMINE, docusate sodium, HYDROmorphone, ipratropium-albuterol, ondansetron (ZOFRAN) IV, polyethylene glycol, promethazine (PHENERGAN) injection (IM or IVPB)   Objective: Vitals:   08/16/21 0900 08/16/21 1013  BP: 114/73  125/85  Pulse: (!) 105 (!) 117  Resp: 18 20  Temp: 98 F (36.7 C) 98.1 F (36.7 C)  SpO2: 100% 98%    Intake/Output Summary (Last 24 hours) at 08/16/2021 1108 Last data filed at 08/16/2021 0803 Gross per 24 hour  Intake 900 ml  Output 0 ml  Net 900 ml    Filed Weights   08/13/21 2122 08/14/21 1555 08/14/21 2100  Weight: 133.2 kg 133.2 kg 133.2 kg   Weight change:  Body mass index is 48.87 kg/m.   Physical Exam: General exam: Pleasant, middle-aged African-American female.  Morbidly obese Skin: No rashes, lesions or ulcers. HEENT: Atraumatic, normocephalic, no obvious bleeding Lungs: Clear to auscultation bilaterally CVS: Regular rate and rhythm, no murmur GI/Abd soft, distended, nontender, bowel sound present CNS: Alert, awake, oriented x3 Psychiatry: Mood appropriate Extremities: Chronic lymphedema  Data Review: I have personally reviewed the laboratory data and studies available.  Recent Labs  Lab 08/10/21 0752 08/12/21 0843  WBC 8.0 6.4  NEUTROABS  --  4.3  HGB 10.5* 10.7*  HCT 32.4* 33.5*  MCV 92.3 92.0  PLT 245 218    Recent Labs  Lab 08/10/21 0752 08/12/21 0843  NA 131* 132*  K 4.1 3.5  CL 93* 95*  CO2 23 24  GLUCOSE 104* 147*  BUN 52* 49*  CREATININE 6.44* 6.35*  CALCIUM 7.9* 7.3*  PHOS 3.6  --      F/u labs ordered Unresulted Labs (From admission, onward)    None       Signed, Terrilee Croak, MD [facility] 08/16/2021

## 2021-08-16 NOTE — Progress Notes (Signed)
   08/16/21 1013  Assess: MEWS Score  Temp 98.1 F (36.7 C)  BP 125/85  Pulse Rate (!) 117  Resp 20  Level of Consciousness Alert  SpO2 98 %  O2 Device Room Air  Assess: MEWS Score  MEWS Temp 0  MEWS Systolic 0  MEWS Pulse 2  MEWS RR 0  MEWS LOC 0  MEWS Score 2  MEWS Score Color Yellow  Assess: if the MEWS score is Yellow or Red  Were vital signs taken at a resting state? Yes  Focused Assessment Change from prior assessment (see assessment flowsheet)  Early Detection of Sepsis Score *See Row Information* Medium  MEWS guidelines implemented *See Row Information* Yes  Treat  MEWS Interventions Escalated (See documentation below)  Pain Scale 0-10  Pain Score 4  Take Vital Signs  Increase Vital Sign Frequency  Yellow: Q 2hr X 2 then Q 4hr X 2, if remains yellow, continue Q 4hrs  Escalate  MEWS: Escalate Yellow: discuss with charge nurse/RN and consider discussing with provider and RRT  Notify: Charge Nurse/RN  Name of Charge Nurse/RN Notified Albert RN  Date Charge Nurse/RN Notified 08/16/21  Time Charge Nurse/RN Notified 1029  Notify: Provider  Provider Name/Title Dr Pietro Cassis  Date Provider Notified 08/16/21  Time Provider Notified 1015  Notification Type Face-to-face  Notification Reason Change in status  Provider response See new orders  Date of Provider Response 08/16/21  Time of Provider Response 1015  Document  Patient Outcome Other (Comment) (new med ordered for HR)    HR tachycadic. MD made aware. See new orders

## 2021-08-16 NOTE — Plan of Care (Signed)
  Problem: Clinical Measurements: Goal: Ability to maintain clinical measurements within normal limits will improve Outcome: Progressing   Problem: Activity: Goal: Risk for activity intolerance will decrease Outcome: Progressing   Problem: Pain Managment: Goal: General experience of comfort will improve Outcome: Progressing   Problem: Safety: Goal: Ability to remain free from injury will improve Outcome: Progressing

## 2021-08-17 DIAGNOSIS — I9589 Other hypotension: Secondary | ICD-10-CM | POA: Diagnosis not present

## 2021-08-17 LAB — RENAL FUNCTION PANEL
Albumin: 3.6 g/dL (ref 3.5–5.0)
Anion gap: 16 — ABNORMAL HIGH (ref 5–15)
BUN: 76 mg/dL — ABNORMAL HIGH (ref 6–20)
CO2: 21 mmol/L — ABNORMAL LOW (ref 22–32)
Calcium: 8.1 mg/dL — ABNORMAL LOW (ref 8.9–10.3)
Chloride: 93 mmol/L — ABNORMAL LOW (ref 98–111)
Creatinine, Ser: 7.41 mg/dL — ABNORMAL HIGH (ref 0.44–1.00)
GFR, Estimated: 6 mL/min — ABNORMAL LOW (ref >60)
Glucose, Bld: 120 mg/dL — ABNORMAL HIGH (ref 70–99)
Phosphorus: 4.8 mg/dL — ABNORMAL HIGH (ref 2.5–4.6)
Potassium: 4.4 mmol/L (ref 3.5–5.1)
Sodium: 130 mmol/L — ABNORMAL LOW (ref 135–145)

## 2021-08-17 LAB — CBC
HCT: 34.4 % — ABNORMAL LOW (ref 36.0–46.0)
Hemoglobin: 11.5 g/dL — ABNORMAL LOW (ref 12.0–15.0)
MCH: 30 pg (ref 26.0–34.0)
MCHC: 33.4 g/dL (ref 30.0–36.0)
MCV: 89.8 fL (ref 80.0–100.0)
Platelets: 210 10*3/uL (ref 150–400)
RBC: 3.83 MIL/uL — ABNORMAL LOW (ref 3.87–5.11)
RDW: 18.1 % — ABNORMAL HIGH (ref 11.5–15.5)
WBC: 9.8 10*3/uL (ref 4.0–10.5)
nRBC: 0 % (ref 0.0–0.2)

## 2021-08-17 LAB — GLUCOSE, CAPILLARY
Glucose-Capillary: 123 mg/dL — ABNORMAL HIGH (ref 70–99)
Glucose-Capillary: 150 mg/dL — ABNORMAL HIGH (ref 70–99)
Glucose-Capillary: 116 mg/dL — ABNORMAL HIGH (ref 70–99)
Glucose-Capillary: 102 mg/dL — ABNORMAL HIGH (ref 70–99)

## 2021-08-17 NOTE — Plan of Care (Signed)
  Problem: Education: Goal: Knowledge of disease and its progression will improve Outcome: Progressing   Problem: Fluid Volume: Goal: Compliance with measures to maintain balanced fluid volume will improve Outcome: Progressing   Problem: Fluid Volume: Goal: Compliance with measures to maintain balanced fluid volume will improve Outcome: Progressing

## 2021-08-17 NOTE — Progress Notes (Signed)
Nutrition Follow Up  DOCUMENTATION CODES:  Not applicable  INTERVENTION:  -Continue Nepro Shake po TID, each supplement provides 425 kcal and 19 grams protein -Continue renal MVI daily   NUTRITION DIAGNOSIS:  Increased nutrient needs related to wound healing as evidenced by estimated needs. -- ongoing  GOAL:  Patient will meet greater than or equal to 90% of their needs -- progressing  MONITOR:  PO intake, Supplement acceptance, Weight trends, I & O's, Labs  REASON FOR ASSESSMENT:  Rounds    ASSESSMENT:  Patient with PMH significant for thyroid cancer, CHF, Chiari malformation, ESRD/HD, COPD, CAD, HLD, HTN, DM, and lymphedema. Presents this admission with cardiogenic shock from RV failure.  7/15- s/p DC cardioversion 8/25- L arm fistulogram  Cardiogenic shock resolved. Plastic surgery following. Plan for pt to have I&D of L medial thigh wound this week on 8/31.   Appetite has continued to improve since last RD visit. Last 8 meal completions charted as 0-100% (75% average meal intake). Pt doing well with Nepro shakes (ordered TID) still per RN. Continue current nutrition plan of care.   Last HD 8/26, 4L net UF  No UOP documented x24 hours  Medications: sensipar, aranesp, SSI, rena-vit, protonix, miralax, senokot Labs: Na 130 (L), PO4 4.8 (H), Cr 7.41 (H)  CBG [phone]  Diet Order:   Diet Order             Diet renal/carb modified with fluid restriction Diet-HS Snack? Nothing; Fluid restriction: 1200 mL Fluid; Room service appropriate? Yes; Fluid consistency: Thin  Diet effective now                  EDUCATION NEEDS:  Education needs have been addressed  Skin:  Skin Assessment: Skin Integrity Issues: Skin Integrity Issues:: Stage II, Unstageable Stage II: R thigh Unstageable: L thigh  Last BM:  8/26  Height:  Ht Readings from Last 1 Encounters:  07/16/21 _0  (1.651 m)   Weight:  Wt Readings from Last 1 Encounters:  08/14/21 133.2 kg   BMI:   Body mass index is 48.87 kg/m.  Estimated Nutritional Needs:  Kcal:  1800-2000 kcal Protein:  90-105 grams Fluid:  >/= 1.8 L/day    Larkin Ina, MS, RD, LDN (she/her/hers) RD pager number and weekend/on-call pager number located in [place].

## 2021-08-17 NOTE — Progress Notes (Signed)
Patient ID: [name], female   DOB: 29-Jan-1966, 55 y.o.   MRN: [id number] S: Feeling better.  She was able to sit in recliner for entire HD session on Friday but admits that the chair was too small and was putting pressure on her left leg wound.  Able to sit in loveseat in her room for 4-5 hours without issue. O:BP 111/71 (BP Location: Right Arm)   Pulse 62   Temp 97.6 F (36.4 C) (Oral)   Resp 18   Ht 5' 5" (1.651 m)   Wt 133.2 kg   SpO2 96%   BMI 48.87 kg/m   Intake/Output Summary (Last 24 hours) at 08/17/2021 1205 Last data filed at 08/17/2021 0956 Gross per 24 hour  Intake 967 ml  Output 0 ml  Net 967 ml   Intake/Output: I/O last 3 completed shifts: In: 900 [P.O.:900] Out: 0   Intake/Output this shift:  Total I/O In: 487 [P.O.:487] Out: -  Weight change:  Gen: NAD CVS:RRR Resp: cta Abd: +BS, soft, NT/ND Ext: +1 chronic edema with wrappings of BLE in place  Recent Labs  Lab 08/12/21 0843 08/17/21 0721  NA 132* 130*  K 3.5 4.4  CL 95* 93*  CO2 24 21*  GLUCOSE 147* 120*  BUN 49* 76*  CREATININE 6.35* 7.41*  ALBUMIN  --  3.6  CALCIUM 7.3* 8.1*  PHOS  --  4.8*   Liver Function Tests: Recent Labs  Lab 08/17/21 0721  ALBUMIN 3.6   No results for input(s): LIPASE, AMYLASE in the last 168 hours. No results for input(s): AMMONIA in the last 168 hours. CBC: Recent Labs  Lab 08/12/21 0843 08/17/21 0721  WBC 6.4 9.8  NEUTROABS 4.3  --   HGB 10.7* 11.5*  HCT 33.5* 34.4*  MCV 92.0 89.8  PLT 218 210   Cardiac Enzymes: No results for input(s): CKTOTAL, CKMB, CKMBINDEX, TROPONINI in the last 168 hours. CBG: Recent Labs  Lab 08/16/21 1151 08/16/21 1653 08/16/21 2102 08/17/21 0647 08/17/21 1151  GLUCAP 150* 153* 110* 123* 150*    Iron Studies: No results for input(s): IRON, TIBC, TRANSFERRIN, FERRITIN in the last 72 hours. Studies/Results: No results found.  amiodarone  200 mg Oral Daily   apixaban  5 mg Oral BID   cinacalcet  30 mg Oral Q  M,W,F-HD   collagenase   Topical Daily   darbepoetin (ARANESP) injection - DIALYSIS  25 mcg Intravenous Q Wed-HD   DULoxetine  20 mg Oral Daily   feeding supplement (NEPRO CARB STEADY)  237 mL Oral TID BM   insulin aspart  0-15 Units Subcutaneous TID WC   insulin aspart  0-5 Units Subcutaneous QHS   mouth rinse  15 mL Mouth Rinse BID   metoprolol tartrate  12.5 mg Oral BID   midodrine  15 mg Oral TID WC   multivitamin  1 tablet Oral QHS   pantoprazole  40 mg Oral BID   polyethylene glycol  17 g Oral Daily   senna  1 tablet Oral Daily    BMET    Component Value Date/Time   NA 130 (L) 08/17/2021 0721   K 4.4 08/17/2021 0721   CL 93 (L) 08/17/2021 0721   CO2 21 (L) 08/17/2021 0721   GLUCOSE 120 (H) 08/17/2021 0721   BUN 76 (H) 08/17/2021 0721   CREATININE 7.41 (H) 08/17/2021 0721   CALCIUM 8.1 (L) 08/17/2021 0721   CALCIUM 8.5 09/06/2012 1056   GFRNONAA 6 (L) 08/17/2021 4461  GFRAA 5 (L) 07/04/2020 0732   GFRAA 5 (L) 07/04/2020 0732   CBC    Component Value Date/Time   WBC 9.8 08/17/2021 0721   RBC 3.83 (L) 08/17/2021 0721   HGB 11.5 (L) 08/17/2021 0721   HCT 34.4 (L) 08/17/2021 0721   PLT 210 08/17/2021 0721   MCV 89.8 08/17/2021 0721   MCH 30.0 08/17/2021 0721   MCHC 33.4 08/17/2021 0721   RDW 18.1 (H) 08/17/2021 0721   LYMPHSABS 1.3 08/12/2021 0843   MONOABS 0.5 08/12/2021 0843   EOSABS 0.1 08/12/2021 0843   BASOSABS 0.0 08/12/2021 0843     Assessment/Plan:  Cardiogenic shock - due to acute on chronic RVF and pulmonary HTN ESRD - continue with HD on MWF schedule Left thigh wound - seen by plastic surgery and for I&D on 08/20/21 Anemia of ESRD - stable Atypical atrial flutter - s/p DCCV on 07/03/21 to NSR, on amiodarone, metoprolol, and on eliquis. CKD-MBD - continue with home meds Hypotension - tolerating HD with UF on midodrine.  Disposition - hopeful discharge after I&D later this week.  Donetta Potts, MD [employer] [phone]

## 2021-08-17 NOTE — Progress Notes (Signed)
RT note. CPAP in patients room ready for use. Patient stated she is able to place on herself. RT will continue to monitor.

## 2021-08-17 NOTE — TOC Progression Note (Signed)
Transition of Care [facility]) - Progression Note    Patient Details  Name: ASTRIA JORDAHL MRN: [id number] Date of Birth: 27-Jun-1966  [facility] CM/SW Contact  Sharlet Salina Mila Homer, LCSW Phone Number: 08/17/2021, 2:05 PM  Clinical Narrative:  Visited with patient  to discuss her discharge disposition once medically stable. When asked, Ms. Perl responded that she still wants [facility] for rehab, unless she is able to go home, which is what she really wants to do. Mrs. Rane reported that she lives with her husband and granddaughter. Mrs. Rockwood informed that CSW will continue to follow and talk with her again regarding her discharge decision.   Expected Discharge Plan: Skilled Nursing Facility Barriers to Discharge: Continued Medical Work up, Ship broker  Expected Discharge Plan and Services Expected Discharge Plan: [place] In-house Referral: Clinical Social Work   Post Acute Care Choice: [place] Living arrangements for the past 2 months: Single Family Home                                     Social Determinants of Health (SDOH) Interventions Food Insecurity Interventions: Intervention Not Indicated Financial Strain Interventions: Intervention Not Indicated Housing Interventions: Intervention Not Indicated Transportation Interventions: Intervention Not Indicated   Readmission Risk Interventions Readmission Risk Prevention Plan 04/30/2019  Transportation Screening Complete  Medication Review Press photographer) Complete  PCP or Specialist appointment within 3-5 days of discharge Complete  HRI or [place] Complete  SW Recovery Care/Counseling Consult Complete  [place] Not Applicable  Some recent data might be hidden

## 2021-08-17 NOTE — Progress Notes (Signed)
PROGRESS NOTE  [name]  DOB: 1966-10-23  PCP: Noreene Larsson, NP PQD:[id number]  DOA: 06/21/2021  LOS: 27 days  Hospital Day: 22   Chief Complaint  Patient presents with   Hypotension    Brief narrative: [name] is a 55 y.o. female with PMH significant for ESRD on HD, morbid obesity, DM2, HTN, CHF, chronic lymphedema, COPD. Patient presented to the ED on 06/21/2021 with complaint of right hip and leg pain with evidence of cellulitis.   She was started on empiric antibiotics but continued to have issues with hypotension with evidence of cardiogenic shock from biventricular failure and pulmonary hypertension.  She was managed in the ICU with vasopressors and hemodialysis. Currently blood pressure is controlled and is stable on midodrine.   Plastic surgery was consulted and patient is planned for I&D next week on 8/31.  Subjective: Patient was seen and examined this morning. Sitting up in bed.  Not in distress.  No new symptoms.  Tachycardia improved  Assessment/Plan: Cardiogenic shock -resolved Acute exacerbation of chronic right ventricular failure with pulmonary hypertension -patient required Vasopressor support in ICU.  Currently on midodrine.   -EF 55 to 60% on last echo from 7/15   ESRD-HD-MWF Chronic hypertension -Nephrology following.  Noted that inpatient dialysis have been challenging because of recurrent hypotension. Encouraged to try and prolong the duration of sitting in a recliner. -Continue midodrine.  Prolonged bleeding/oozing from AV fistula -8/25, patient underwent fistulogram by IR.  Did have tortuous AV fistula but no evidence of bleeding.  Continue apixaban.  If she starts to bleed, need to cut back on apixaban from 5 mg to 2.5 mg.    Left medial thigh wound -Seen by plastic surgery.  Noted the plan for wound debridement on 8/31.   Infected RUE midline catheter -Midline removed and patient treated with linezolid for 5 days. Resolved.    Atypical atrial flutter -Patient underwent DCCV on 7/15 with reversion to normal sinus rhythm. -Continue amiodarone.  On 8/28, patient was having intermittent tachycardia and hence I added Metoprolol 12.5 mg twice daily.  Heart rate remained stable.  Continue Eliquis for anticoagulation.    Possible OSA/OHS Pulmonary hypertension -Started on CPAP during hospitalization -Continue CPAP with recommendation for outpatient sleep study   Fibromyalgia -Continue Cymbalta   Anemia of chronic disease -Hemoglobin stable. Recent Labs    07/22/21 0104 07/24/21 0838 07/27/21 0703 07/29/21 0040 07/31/21 0037 08/04/21 0043 08/06/21 1344 08/10/21 0752 08/12/21 0843 08/17/21 0721  HGB 11.1* 10.7* 10.4* 9.9* 10.5* 11.0* 11.3* 10.5* 10.7* 11.5*     Diabetes mellitus, type 2 -Hemoglobin A1c of 6.9%. Not on any antidiabetic at this time. -Level is better in last 24 hours.  Continue to monitor. Recent Labs  Lab 08/16/21 1151 08/16/21 1653 08/16/21 2102 08/17/21 0647 08/17/21 1151  GLUCAP 150* 153* 110* 123* 150*     Morbid obesity  -Body mass index is 48.87 kg/m. Patient has been advised to make an attempt to improve diet and exercise patterns to aid in weight loss.   Weakness PT/OT recommended SNF.     Pressure injury Right/posterior/proximal thigh. Not present on admission.   Mobility: PT eval obtained Code Status:   Code Status: Full Code  Nutritional status: Body mass index is 48.87 kg/m. Nutrition Problem: Increased nutrient needs Etiology: wound healing Signs/Symptoms: estimated needs Diet:  Diet Order             Diet renal/carb modified with fluid restriction Diet-HS Snack? Nothing; Fluid restriction:  1200 mL Fluid; Room service appropriate? Yes; Fluid consistency: Thin  Diet effective now                  DVT prophylaxis: apixaban (ELIQUIS) tablet 5 mg    Antimicrobials: None Fluid: None Consultants: Nephrology Family Communication: None at  bedside  Status is: Inpatient  Remains inpatient appropriate because - Pending I&D on 8/31.  Dispo: The patient is from: Home              Anticipated d/c is to: SNF              Patient currently is not medically stable to d/c.   Difficult to place patient No     Infusions:   sodium chloride     promethazine (PHENERGAN) injection (IM or IVPB) 12.5 mg (08/08/21 0901)    Scheduled Meds:  amiodarone  200 mg Oral Daily   apixaban  5 mg Oral BID   cinacalcet  30 mg Oral Q M,W,F-HD   collagenase   Topical Daily   darbepoetin (ARANESP) injection - DIALYSIS  25 mcg Intravenous Q Wed-HD   DULoxetine  20 mg Oral Daily   feeding supplement (NEPRO CARB STEADY)  237 mL Oral TID BM   insulin aspart  0-15 Units Subcutaneous TID WC   insulin aspart  0-5 Units Subcutaneous QHS   mouth rinse  15 mL Mouth Rinse BID   metoprolol tartrate  12.5 mg Oral BID   midodrine  15 mg Oral TID WC   multivitamin  1 tablet Oral QHS   pantoprazole  40 mg Oral BID   polyethylene glycol  17 g Oral Daily   senna  1 tablet Oral Daily    Antimicrobials: Anti-infectives (From admission, onward)    Start     Dose/Rate Route Frequency Ordered Stop   08/06/21 1530  piperacillin-tazobactam (ZOSYN) IVPB 2.25 g        2.25 g 100 mL/hr over 30 Minutes Intravenous Every 8 hours 08/06/21 1434 08/10/21 2347   07/16/21 2000  DAPTOmycin (CUBICIN) 500 mg in sodium chloride 0.9 % IVPB  Status:  Discontinued        6 mg/kg  87.4 kg (Adjusted) 120 mL/hr over 30 Minutes Intravenous Every 48 hours 07/15/21 1138 07/16/21 1043   07/16/21 1200  linezolid (ZYVOX) tablet 600 mg        600 mg Oral Every 12 hours 07/16/21 1101 07/21/21 2018   07/15/21 1000  ceFEPIme (MAXIPIME) 1 g in sodium chloride 0.9 % 100 mL IVPB  Status:  Discontinued        1 g 200 mL/hr over 30 Minutes Intravenous Every 24 hours 07/15/21 0913 07/16/21 1043   07/15/21 0900  cefTRIAXone (ROCEPHIN) 1 g in sodium chloride 0.9 % 100 mL IVPB  Status:   Discontinued        1 g 200 mL/hr over 30 Minutes Intravenous Every 24 hours 07/15/21 0752 07/15/21 0913   07/15/21 0500  DAPTOmycin (CUBICIN) 500 mg in sodium chloride 0.9 % IVPB  Status:  Discontinued        6 mg/kg  87.4 kg (Adjusted) 120 mL/hr over 30 Minutes Intravenous Every 48 hours 07/15/21 0404 07/15/21 1138   06/23/21 0930  DAPTOmycin (CUBICIN) 500 mg in sodium chloride 0.9 % IVPB        6 mg/kg  86.2 kg (Adjusted) 120 mL/hr over 30 Minutes Intravenous Every 48 hours 06/23/21 0831 06/28/21 0130   06/23/21 0915  DAPTOmycin (CUBICIN) 800 mg  in sodium chloride 0.9 % IVPB  Status:  Discontinued        6 mg/kg  130.1 kg 132 mL/hr over 30 Minutes Intravenous Every 48 hours 06/23/21 0825 06/23/21 0831   06/22/21 2200  ceFEPIme (MAXIPIME) 1 g in sodium chloride 0.9 % 100 mL IVPB  Status:  Discontinued        1 g 200 mL/hr over 30 Minutes Intravenous Every 24 hours 06/22/21 0001 06/23/21 0825   06/22/21 0800  metroNIDAZOLE (FLAGYL) IVPB 500 mg  Status:  Discontinued        500 mg 100 mL/hr over 60 Minutes Intravenous Every 8 hours 06/22/21 0716 06/23/21 0825   06/21/21 2230  doxycycline (VIBRAMYCIN) 100 mg in sodium chloride 0.9 % 250 mL IVPB  Status:  Discontinued        100 mg 125 mL/hr over 120 Minutes Intravenous Every 12 hours 06/21/21 2223 06/23/21 0825   06/21/21 2215  ceFEPIme (MAXIPIME) 2 g in sodium chloride 0.9 % 100 mL IVPB        2 g 200 mL/hr over 30 Minutes Intravenous  Once 06/21/21 2208 06/21/21 2245   06/21/21 2215  metroNIDAZOLE (FLAGYL) IVPB 500 mg        500 mg 100 mL/hr over 60 Minutes Intravenous  Once 06/21/21 2208 06/21/21 2340       PRN meds: acetaminophen, dextromethorphan-guaiFENesin, diphenhydrAMINE, docusate sodium, HYDROmorphone, ipratropium-albuterol, ondansetron (ZOFRAN) IV, polyethylene glycol, promethazine (PHENERGAN) injection (IM or IVPB)   Objective: Vitals:   08/16/21 2200 08/17/21 1043  BP: 128/76 111/71  Pulse: 67 62  Resp: 18 18   Temp: 97.8 F (36.6 C) 97.6 F (36.4 C)  SpO2: 97% 96%    Intake/Output Summary (Last 24 hours) at 08/17/2021 1458 Last data filed at 08/17/2021 0956 Gross per 24 hour  Intake 967 ml  Output 0 ml  Net 967 ml    Filed Weights   08/13/21 2122 08/14/21 1555 08/14/21 2100  Weight: 133.2 kg 133.2 kg 133.2 kg   Weight change:  Body mass index is 48.87 kg/m.   Physical Exam: General exam: Pleasant, middle-aged African-American female.  Morbidly obese Skin: No rashes, lesions or ulcers. HEENT: Atraumatic, normocephalic, no obvious bleeding Lungs: Clear to auscultation bilaterally CVS: Regular rate and rhythm, no murmur GI/Abd soft, distended, nontender, bowel sound present CNS: Alert, awake, oriented x3 Psychiatry: Mood appropriate Extremities: Chronic lymphedema  Data Review: I have personally reviewed the laboratory data and studies available.  Recent Labs  Lab 08/12/21 0843 08/17/21 0721  WBC 6.4 9.8  NEUTROABS 4.3  --   HGB 10.7* 11.5*  HCT 33.5* 34.4*  MCV 92.0 89.8  PLT 218 210    Recent Labs  Lab 08/12/21 0843 08/17/21 0721  NA 132* 130*  K 3.5 4.4  CL 95* 93*  CO2 24 21*  GLUCOSE 147* 120*  BUN 49* 76*  CREATININE 6.35* 7.41*  CALCIUM 7.3* 8.1*  PHOS  --  4.8*     F/u labs ordered Unresulted Labs (From admission, onward)    None       Signed, Terrilee Croak, MD [facility] 08/17/2021

## 2021-08-18 DIAGNOSIS — I9589 Other hypotension: Secondary | ICD-10-CM | POA: Diagnosis not present

## 2021-08-18 LAB — GLUCOSE, CAPILLARY
Glucose-Capillary: 138 mg/dL — ABNORMAL HIGH (ref 70–99)
Glucose-Capillary: 144 mg/dL — ABNORMAL HIGH (ref 70–99)
Glucose-Capillary: 144 mg/dL — ABNORMAL HIGH (ref 70–99)
Glucose-Capillary: 186 mg/dL — ABNORMAL HIGH (ref 70–99)

## 2021-08-18 LAB — SURGICAL PCR SCREEN
MRSA, PCR: NEGATIVE
Staphylococcus aureus: NEGATIVE

## 2021-08-18 MED ORDER — CEFAZOLIN IN SODIUM CHLORIDE 3-0.9 GM/100ML-% IV SOLN
3.0000 g | INTRAVENOUS | Status: AC
  Administered 2021-08-19: 3 g via INTRAVENOUS
  Filled 2021-08-18 (×2): qty 100

## 2021-08-18 MED ORDER — CHLORHEXIDINE GLUCONATE CLOTH 2 % EX PADS
6.0000 | MEDICATED_PAD | Freq: Once | CUTANEOUS | Status: AC
  Administered 2021-08-19: 6 via TOPICAL

## 2021-08-18 MED ORDER — CHLORHEXIDINE GLUCONATE CLOTH 2 % EX PADS
6.0000 | MEDICATED_PAD | Freq: Once | CUTANEOUS | Status: AC
  Administered 2021-08-18: 6 via TOPICAL

## 2021-08-18 MED ORDER — DARBEPOETIN ALFA 25 MCG/0.42ML IJ SOSY: 25.0000 ug | PREFILLED_SYRINGE | INTRAMUSCULAR | Status: DC

## 2021-08-18 NOTE — Progress Notes (Signed)
PROGRESS NOTE  [name]  DOB: [birth year]  PCP: Noreene Larsson, NP KDX:[id number]  DOA: 06/21/2021  LOS: 49 days  Hospital Day: 61   Chief Complaint  Patient presents with   Hypotension    Brief narrative: [name] is a 55 y.o. female with PMH significant for ESRD on HD, morbid obesity, DM2, HTN, CHF, chronic lymphedema, COPD. Patient presented to the ED on 06/21/2021 with complaint of right hip and leg pain with evidence of cellulitis.   She was started on empiric antibiotics but continued to have issues with hypotension with evidence of cardiogenic shock from biventricular failure and pulmonary hypertension.  She was managed in the ICU with vasopressors and hemodialysis. Currently blood pressure is controlled and is stable on midodrine.   Plastic surgery was consulted and patient is planned for I&D next week on 8/31.  Subjective: Patient was seen and examined this morning. Sitting up in bed.  Not in distress.  No new symptoms.  Tachycardia improved.  Assessment/Plan: Cardiogenic shock -resolved Acute exacerbation of chronic right ventricular failure with pulmonary hypertension -patient required Vasopressor support in ICU.  Currently on midodrine.   -EF 55 to 60% on last echo from 7/15   ESRD-HD-MWF Chronic hypertension -Nephrology following.  Noted that inpatient dialysis have been challenging because of recurrent hypotension. Encouraged to try and prolong the duration of sitting in a recliner. -Continue midodrine.  Prolonged bleeding/oozing from AV fistula -8/25, patient underwent fistulogram by IR.  Did have tortuous AV fistula but no evidence of bleeding.  Continue apixaban.  If she starts to bleed, need to cut back on apixaban from 5 mg to 2.5 mg.    Left medial thigh wound -Seen by plastic surgery.  Noted the plan for wound debridement on 8/31 tomorrow.   Infected RUE midline catheter -Midline removed and patient treated with linezolid for 5 days.  Resolved.   Atypical atrial flutter -Patient underwent DCCV on 7/15 with reversion to normal sinus rhythm. -Continue amiodarone.  On 8/28, patient was having intermittent tachycardia and hence I added Metoprolol 12.5 mg twice daily.  Heart rate remained stable.  Continue Eliquis for anticoagulation.    Possible OSA/OHS Pulmonary hypertension -Started on CPAP during hospitalization -Continue CPAP with recommendation for outpatient sleep study   Fibromyalgia -Continue Cymbalta   Anemia of chronic disease -Hemoglobin stable. Recent Labs    07/22/21 0104 07/24/21 0838 07/27/21 0703 07/29/21 0040 07/31/21 0037 08/04/21 0043 08/06/21 1344 08/10/21 0752 08/12/21 0843 08/17/21 0721  HGB 11.1* 10.7* 10.4* 9.9* 10.5* 11.0* 11.3* 10.5* 10.7* 11.5*     Diabetes mellitus, type 2 -Hemoglobin A1c of 6.9%. Not on any antidiabetic at this time. -Level is better in last 24 hours.  Continue to monitor. Recent Labs  Lab 08/17/21 1151 08/17/21 1812 08/17/21 2101 08/18/21 0625 08/18/21 1206  GLUCAP 150* 116* 102* 138* 144*     Morbid obesity  -Body mass index is 46.34 kg/m. Patient has been advised to make an attempt to improve diet and exercise patterns to aid in weight loss.   Weakness PT/OT recommended SNF.     Pressure injury Right/posterior/proximal thigh. Not present on admission.   Mobility: PT eval obtained Code Status:   Code Status: Full Code  Nutritional status: Body mass index is 46.34 kg/m. Nutrition Problem: Increased nutrient needs Etiology: wound healing Signs/Symptoms: estimated needs Diet:  Diet Order             Diet NPO time specified  Diet effective midnight  Diet renal/carb modified with fluid restriction Diet-HS Snack? Nothing; Fluid restriction: 1200 mL Fluid; Room service appropriate? Yes; Fluid consistency: Thin  Diet effective now                  DVT prophylaxis: apixaban (ELIQUIS) tablet 5 mg    Antimicrobials:  None Fluid: None Consultants: Nephrology Family Communication: None at bedside  Status is: Inpatient  Remains inpatient appropriate because - Pending I&D on 8/31.  Dispo: The patient is from: Home              Anticipated d/c is to: SNF              Patient currently is not medically stable to d/c.   Difficult to place patient No     Infusions:   sodium chloride     promethazine (PHENERGAN) injection (IM or IVPB) 12.5 mg (08/08/21 0901)    Scheduled Meds:  amiodarone  200 mg Oral Daily   apixaban  5 mg Oral BID   cinacalcet  30 mg Oral Q M,W,F-HD   collagenase   Topical Daily   darbepoetin (ARANESP) injection - DIALYSIS  25 mcg Intravenous Q Wed-HD   DULoxetine  20 mg Oral Daily   feeding supplement (NEPRO CARB STEADY)  237 mL Oral TID BM   insulin aspart  0-15 Units Subcutaneous TID WC   insulin aspart  0-5 Units Subcutaneous QHS   mouth rinse  15 mL Mouth Rinse BID   metoprolol tartrate  12.5 mg Oral BID   midodrine  15 mg Oral TID WC   multivitamin  1 tablet Oral QHS   pantoprazole  40 mg Oral BID   polyethylene glycol  17 g Oral Daily   senna  1 tablet Oral Daily    Antimicrobials: Anti-infectives (From admission, onward)    Start     Dose/Rate Route Frequency Ordered Stop   08/06/21 1530  piperacillin-tazobactam (ZOSYN) IVPB 2.25 g        2.25 g 100 mL/hr over 30 Minutes Intravenous Every 8 hours 08/06/21 1434 08/10/21 2347   07/16/21 2000  DAPTOmycin (CUBICIN) 500 mg in sodium chloride 0.9 % IVPB  Status:  Discontinued        6 mg/kg  87.4 kg (Adjusted) 120 mL/hr over 30 Minutes Intravenous Every 48 hours 07/15/21 1138 07/16/21 1043   07/16/21 1200  linezolid (ZYVOX) tablet 600 mg        600 mg Oral Every 12 hours 07/16/21 1101 07/21/21 2018   07/15/21 1000  ceFEPIme (MAXIPIME) 1 g in sodium chloride 0.9 % 100 mL IVPB  Status:  Discontinued        1 g 200 mL/hr over 30 Minutes Intravenous Every 24 hours 07/15/21 0913 07/16/21 1043   07/15/21 0900   cefTRIAXone (ROCEPHIN) 1 g in sodium chloride 0.9 % 100 mL IVPB  Status:  Discontinued        1 g 200 mL/hr over 30 Minutes Intravenous Every 24 hours 07/15/21 0752 07/15/21 0913   07/15/21 0500  DAPTOmycin (CUBICIN) 500 mg in sodium chloride 0.9 % IVPB  Status:  Discontinued        6 mg/kg  87.4 kg (Adjusted) 120 mL/hr over 30 Minutes Intravenous Every 48 hours 07/15/21 0404 07/15/21 1138   06/23/21 0930  DAPTOmycin (CUBICIN) 500 mg in sodium chloride 0.9 % IVPB        6 mg/kg  86.2 kg (Adjusted) 120 mL/hr over 30 Minutes Intravenous Every 48 hours 06/23/21 0831  06/28/21 0130   06/23/21 0915  DAPTOmycin (CUBICIN) 800 mg in sodium chloride 0.9 % IVPB  Status:  Discontinued        6 mg/kg  130.1 kg 132 mL/hr over 30 Minutes Intravenous Every 48 hours 06/23/21 0825 06/23/21 0831   06/22/21 2200  ceFEPIme (MAXIPIME) 1 g in sodium chloride 0.9 % 100 mL IVPB  Status:  Discontinued        1 g 200 mL/hr over 30 Minutes Intravenous Every 24 hours 06/22/21 0001 06/23/21 0825   06/22/21 0800  metroNIDAZOLE (FLAGYL) IVPB 500 mg  Status:  Discontinued        500 mg 100 mL/hr over 60 Minutes Intravenous Every 8 hours 06/22/21 0716 06/23/21 0825   06/21/21 2230  doxycycline (VIBRAMYCIN) 100 mg in sodium chloride 0.9 % 250 mL IVPB  Status:  Discontinued        100 mg 125 mL/hr over 120 Minutes Intravenous Every 12 hours 06/21/21 2223 06/23/21 0825   06/21/21 2215  ceFEPIme (MAXIPIME) 2 g in sodium chloride 0.9 % 100 mL IVPB        2 g 200 mL/hr over 30 Minutes Intravenous  Once 06/21/21 2208 06/21/21 2245   06/21/21 2215  metroNIDAZOLE (FLAGYL) IVPB 500 mg        500 mg 100 mL/hr over 60 Minutes Intravenous  Once 06/21/21 2208 06/21/21 2340       PRN meds: acetaminophen, dextromethorphan-guaiFENesin, diphenhydrAMINE, docusate sodium, HYDROmorphone, ipratropium-albuterol, ondansetron (ZOFRAN) IV, polyethylene glycol, promethazine (PHENERGAN) injection (IM or IVPB)   Objective: Vitals:    08/18/21 0800 08/18/21 1013  BP: 118/66 120/72  Pulse: 67 63  Resp: 18 19  Temp: 98.2 F (36.8 C) 97.9 F (36.6 C)  SpO2:  97%    Intake/Output Summary (Last 24 hours) at 08/18/2021 1513 Last data filed at 08/18/2021 1300 Gross per 24 hour  Intake 840 ml  Output 2092 ml  Net -1252 ml    Filed Weights   08/14/21 2100 08/17/21 1417 08/17/21 1750  Weight: 133.2 kg 128.3 kg 126.3 kg   Weight change:  Body mass index is 46.34 kg/m.   Physical Exam: General exam: Pleasant, middle-aged African-American female.  Morbidly obese Skin: No rashes, lesions or ulcers. HEENT: Atraumatic, normocephalic, no obvious bleeding Lungs: Clear to auscultation bilaterally CVS: Regular rate and rhythm, no murmur GI/Abd soft, distended, nontender, bowel sound present CNS: Alert, awake and oriented x3 Psychiatry: Mood appropriate Extremities: Chronic lymphedema  Data Review: I have personally reviewed the laboratory data and studies available.  Recent Labs  Lab 08/12/21 0843 08/17/21 0721  WBC 6.4 9.8  NEUTROABS 4.3  --   HGB 10.7* 11.5*  HCT 33.5* 34.4*  MCV 92.0 89.8  PLT 218 210    Recent Labs  Lab 08/12/21 0843 08/17/21 0721  NA 132* 130*  K 3.5 4.4  CL 95* 93*  CO2 24 21*  GLUCOSE 147* 120*  BUN 49* 76*  CREATININE 6.35* 7.41*  CALCIUM 7.3* 8.1*  PHOS  --  4.8*     F/u labs ordered Unresulted Labs (From admission, onward)     Start     Ordered   Signed and Held  Renal function panel  Once,   R       Question:  Specimen collection method  Answer:  Lab=Lab collect   Signed and Held   Signed and Held  CBC  Once,   R       Question:  Specimen collection method  Answer:  Lab=Lab collect   Signed and Held            Signed, Terrilee Croak, MD [facility] 08/18/2021

## 2021-08-18 NOTE — TOC Progression Note (Signed)
Transition of Care [facility]) - Progression Note    Patient Details  Name: [name] MRN: [id number] Date of Birth: [birth year]  Transition of Care [facility] CM/SW Contact  Sharlet Salina Mila Homer, LCSW Phone Number: 08/18/2021, 4:43 PM  Clinical Narrative:  Darra Lis, admissions director at [facility] in [place] regarding patient. Debbie given brief update regarding patient medically and also informed her that patient still would like to d/c to [facility] once medically stable. Debbie requested that clinicals be resent to her and this was done. CSW will continue to follow and provide SW intervention services through discharge.  Expected Discharge Plan: Skilled Nursing Facility Barriers to Discharge: Continued Medical Work up, Ship broker  Expected Discharge Plan and Services Expected Discharge Plan: [place] In-house Referral: Clinical Social Work   Post Acute Care Choice: [place] Living arrangements for the past 2 months: Single Family Home                                       Social Determinants of Health (SDOH) Interventions Food Insecurity Interventions: Intervention Not Indicated Financial Strain Interventions: Intervention Not Indicated Housing Interventions: Intervention Not Indicated Transportation Interventions: Intervention Not Indicated  Readmission Risk Interventions Readmission Risk Prevention Plan 04/30/2019  Transportation Screening Complete  Medication Review Press photographer) Complete  PCP or Specialist appointment within 3-5 days of discharge Complete  HRI or [place] Complete  SW Recovery Care/Counseling Consult Complete  [place] Not Applicable  Some recent data might be hidden

## 2021-08-18 NOTE — Plan of Care (Signed)
  Problem: Activity: Goal: Risk for activity intolerance will decrease Outcome: Progressing   Problem: Pain Managment: Goal: General experience of comfort will improve Outcome: Progressing   Problem: Fluid Volume: Goal: Compliance with measures to maintain balanced fluid volume will improve Outcome: Progressing

## 2021-08-18 NOTE — Progress Notes (Signed)
OT Cancellation Note  Patient Details Name: GENIECE AKERS MRN: [id number] DOB: 04-Oct-1966   Cancelled Treatment:    Reason Eval/Treat Not Completed: Patient declined. Reports her daughter is doing her hair and cannot do therapy right now.  Has surgery and HD tomorrow. OT to check back as time allows.   Gloris Manchester OTR/L Supplemental OT, Department of rehab services [phone]  Janeka Libman R H. 08/18/2021, 12:51 PM

## 2021-08-18 NOTE — Progress Notes (Signed)
PT Cancellation Note  Patient Details Name: [name] MRN: [id number] DOB: 29-Jan-1966   Cancelled Treatment:    Reason Eval/Treat Not Completed: Other (comment).  Reports her daughter is doing her hair and cannot do therapy right now.  Has surgery and HD tomorrow, retry as time and pt allow.   Ramond Dial 08/18/2021, 12:42 PM  Mee Hives, PT MS Acute Rehab Dept. Number: [place] and [place]

## 2021-08-18 NOTE — Plan of Care (Signed)
  Problem: Clinical Measurements: Goal: Ability to maintain clinical measurements within normal limits will improve Outcome: Progressing   Problem: Activity: Goal: Risk for activity intolerance will decrease Outcome: Progressing

## 2021-08-18 NOTE — Plan of Care (Signed)
  Problem: Clinical Measurements: Goal: Ability to maintain clinical measurements within normal limits will improve Outcome: Progressing   Problem: Activity: Goal: Risk for activity intolerance will decrease Outcome: Progressing   Problem: Pain Managment: Goal: General experience of comfort will improve Outcome: Progressing

## 2021-08-18 NOTE — Progress Notes (Signed)
Orthopedic Tech Progress Note Patient Details:  [name] 1966-06-27 [id number]  Martin Majestic to apply a fresh pair of UNNA BOOTS I saw patient had blood pulling from foot where RN had cut patient while removing old UNNA BOOTS, he applied a 2 by 2 gauze to foot but do to her being on blood thinners needed foot to be dressed better before applying UNNA BOOT to that leg.   Ortho Devices Type of Ortho Device: Haematologist Ortho Device/Splint Location: RLE Ortho Device/Splint Interventions: Application, Ordered, Adjustment   Post Interventions Patient Tolerated: Well Instructions Provided: Care of [place] 08/18/2021, 5:00 PM

## 2021-08-18 NOTE — Progress Notes (Signed)
Patient ID: [name], female   DOB: 1966/07/04, 55 y.o.   MRN: [id number] S: Doing well without new complaints. O:BP 120/72 (BP Location: Right Arm)   Pulse 63   Temp 97.9 F (36.6 C) (Oral)   Resp 19   Ht _0  (1.651 m)   Wt 126.3 kg   SpO2 97%   BMI 46.34 kg/m   Intake/Output Summary (Last 24 hours) at 08/18/2021 1130 Last data filed at 08/18/2021 0800 Gross per 24 hour  Intake 540 ml  Output 2092 ml  Net -1552 ml   Intake/Output: I/O last 3 completed shifts: In: 967 [P.O.:967] Out: 2092 [Other:2092]  Intake/Output this shift:  Total I/O In: 300 [P.O.:300] Out: -  Weight change:  Gen:NAD CVS: RRR Resp: CTA Abd: +BS, soft, NT/nD Ext:+ chronic edema with leg wrappings in place, LAVF +T/B  Recent Labs  Lab 08/12/21 0843 08/17/21 0721  NA 132* 130*  K 3.5 4.4  CL 95* 93*  CO2 24 21*  GLUCOSE 147* 120*  BUN 49* 76*  CREATININE 6.35* 7.41*  ALBUMIN  --  3.6  CALCIUM 7.3* 8.1*  PHOS  --  4.8*   Liver Function Tests: Recent Labs  Lab 08/17/21 0721  ALBUMIN 3.6   No results for input(s): LIPASE, AMYLASE in the last 168 hours. No results for input(s): AMMONIA in the last 168 hours. CBC: Recent Labs  Lab 08/12/21 0843 08/17/21 0721  WBC 6.4 9.8  NEUTROABS 4.3  --   HGB 10.7* 11.5*  HCT 33.5* 34.4*  MCV 92.0 89.8  PLT 218 210   Cardiac Enzymes: No results for input(s): CKTOTAL, CKMB, CKMBINDEX, TROPONINI in the last 168 hours. CBG: Recent Labs  Lab 08/17/21 0647 08/17/21 1151 08/17/21 1812 08/17/21 2101 08/18/21 0625  GLUCAP 123* 150* 116* 102* 138*    Iron Studies: No results for input(s): IRON, TIBC, TRANSFERRIN, FERRITIN in the last 72 hours. Studies/Results: No results found.  amiodarone  200 mg Oral Daily   apixaban  5 mg Oral BID   cinacalcet  30 mg Oral Q M,W,F-HD   collagenase   Topical Daily   darbepoetin (ARANESP) injection - DIALYSIS  25 mcg Intravenous Q Wed-HD   DULoxetine  20 mg Oral Daily   feeding supplement (NEPRO  CARB STEADY)  237 mL Oral TID BM   insulin aspart  0-15 Units Subcutaneous TID WC   insulin aspart  0-5 Units Subcutaneous QHS   mouth rinse  15 mL Mouth Rinse BID   metoprolol tartrate  12.5 mg Oral BID   midodrine  15 mg Oral TID WC   multivitamin  1 tablet Oral QHS   pantoprazole  40 mg Oral BID   polyethylene glycol  17 g Oral Daily   senna  1 tablet Oral Daily    BMET    Component Value Date/Time   NA 130 (L) 08/17/2021 0721   K 4.4 08/17/2021 0721   CL 93 (L) 08/17/2021 0721   CO2 21 (L) 08/17/2021 0721   GLUCOSE 120 (H) 08/17/2021 0721   BUN 76 (H) 08/17/2021 0721   CREATININE 7.41 (H) 08/17/2021 0721   CALCIUM 8.1 (L) 08/17/2021 0721   CALCIUM 8.5 09/06/2012 1056   GFRNONAA 6 (L) 08/17/2021 0721   GFRAA 5 (L) 07/04/2020 0732   GFRAA 5 (L) 07/04/2020 0732   CBC    Component Value Date/Time   WBC 9.8 08/17/2021 0721   RBC 3.83 (L) 08/17/2021 0721   HGB 11.5 (L) 08/17/2021 6004  HCT 34.4 (L) 08/17/2021 0721   PLT 210 08/17/2021 0721   MCV 89.8 08/17/2021 0721   MCH 30.0 08/17/2021 0721   MCHC 33.4 08/17/2021 0721   RDW 18.1 (H) 08/17/2021 0721   LYMPHSABS 1.3 08/12/2021 0843   MONOABS 0.5 08/12/2021 0843   EOSABS 0.1 08/12/2021 0843   BASOSABS 0.0 08/12/2021 0843     Assessment/Plan:   Cardiogenic shock - due to acute on chronic RVF and pulmonary HTN ESRD - continue with HD on MWF schedule.  Will plan for HD in am so she can have her surgery at 2 pm tomorrow. Left thigh wound - seen by plastic surgery and for surgical debridement and possible wound vac tomorrow at 2:30. Anemia of ESRD - stable Atypical atrial flutter - s/p DCCV on 07/03/21 to NSR, on amiodarone, metoprolol, and on eliquis. CKD-MBD - continue with home meds Hypotension - tolerating HD with UF on midodrine.  Disposition - hopeful discharge after I&D later this week.  Donetta Potts, MD [employer] [phone]

## 2021-08-19 ENCOUNTER — Encounter (HOSPITAL_COMMUNITY)
Admission: EM | Disposition: A | Discharge status: Home Health | Payer: Self-pay | Source: Home / Self Care | Attending: Internal Medicine

## 2021-08-19 ENCOUNTER — Encounter (HOSPITAL_COMMUNITY): Payer: Self-pay | Admitting: Pulmonary Disease

## 2021-08-19 ENCOUNTER — Inpatient Hospital Stay (HOSPITAL_COMMUNITY): Payer: 59 | Admitting: Anesthesiology

## 2021-08-19 DIAGNOSIS — L03115 Cellulitis of right lower limb: Secondary | ICD-10-CM | POA: Diagnosis not present

## 2021-08-19 DIAGNOSIS — I9589 Other hypotension: Secondary | ICD-10-CM | POA: Diagnosis not present

## 2021-08-19 DIAGNOSIS — I132 Hypertensive heart and chronic kidney disease with heart failure and with stage 5 chronic kidney disease, or end stage renal disease: Secondary | ICD-10-CM | POA: Diagnosis not present

## 2021-08-19 DIAGNOSIS — Z20822 Contact with and (suspected) exposure to covid-19: Secondary | ICD-10-CM | POA: Diagnosis not present

## 2021-08-19 DIAGNOSIS — N186 End stage renal disease: Secondary | ICD-10-CM | POA: Diagnosis not present

## 2021-08-19 DIAGNOSIS — L03119 Cellulitis of unspecified part of limb: Secondary | ICD-10-CM

## 2021-08-19 HISTORY — PX: APPLICATION OF WOUND VAC: SHX5189

## 2021-08-19 HISTORY — PX: I & D EXTREMITY: SHX5045

## 2021-08-19 LAB — GLUCOSE, CAPILLARY
Glucose-Capillary: 147 mg/dL — ABNORMAL HIGH (ref 70–99)
Glucose-Capillary: 98 mg/dL (ref 70–99)
Glucose-Capillary: 105 mg/dL — ABNORMAL HIGH (ref 70–99)
Glucose-Capillary: 69 mg/dL — ABNORMAL LOW (ref 70–99)
Glucose-Capillary: 91 mg/dL (ref 70–99)
Glucose-Capillary: 138 mg/dL — ABNORMAL HIGH (ref 70–99)

## 2021-08-19 LAB — CBC
HCT: 34.8 % — ABNORMAL LOW (ref 36.0–46.0)
Hemoglobin: 11.5 g/dL — ABNORMAL LOW (ref 12.0–15.0)
MCH: 30.1 pg (ref 26.0–34.0)
MCHC: 33 g/dL (ref 30.0–36.0)
MCV: 91.1 fL (ref 80.0–100.0)
Platelets: 228 10*3/uL (ref 150–400)
RBC: 3.82 MIL/uL — ABNORMAL LOW (ref 3.87–5.11)
RDW: 18.6 % — ABNORMAL HIGH (ref 11.5–15.5)
WBC: 9.7 10*3/uL (ref 4.0–10.5)
nRBC: 0 % (ref 0.0–0.2)

## 2021-08-19 LAB — RENAL FUNCTION PANEL
Albumin: 3.7 g/dL (ref 3.5–5.0)
Anion gap: 14 (ref 5–15)
BUN: 46 mg/dL — ABNORMAL HIGH (ref 6–20)
CO2: 24 mmol/L (ref 22–32)
Calcium: 8.1 mg/dL — ABNORMAL LOW (ref 8.9–10.3)
Chloride: 95 mmol/L — ABNORMAL LOW (ref 98–111)
Creatinine, Ser: 5.2 mg/dL — ABNORMAL HIGH (ref 0.44–1.00)
GFR, Estimated: 9 mL/min — ABNORMAL LOW (ref >60)
Glucose, Bld: 128 mg/dL — ABNORMAL HIGH (ref 70–99)
Phosphorus: 3.3 mg/dL (ref 2.5–4.6)
Potassium: 3.5 mmol/L (ref 3.5–5.1)
Sodium: 133 mmol/L — ABNORMAL LOW (ref 135–145)

## 2021-08-19 SURGERY — IRRIGATION AND DEBRIDEMENT EXTREMITY
Anesthesia: General | Site: Thigh | Laterality: Left

## 2021-08-19 MED ORDER — ACETAMINOPHEN 10 MG/ML IV SOLN: 1000.0000 mg | Freq: Once | INTRAVENOUS | Status: DC | PRN

## 2021-08-19 MED ORDER — ORAL CARE MOUTH RINSE: 15.0000 mL | Freq: Once | OROMUCOSAL | Status: AC

## 2021-08-19 MED ORDER — FENTANYL CITRATE (PF) 100 MCG/2ML IJ SOLN
INTRAMUSCULAR | Status: AC
  Filled 2021-08-19: qty 2

## 2021-08-19 MED ORDER — SODIUM CHLORIDE 0.9 % IR SOLN
Status: DC | PRN
  Administered 2021-08-19: 1000 mL

## 2021-08-19 MED ORDER — FENTANYL CITRATE PF 50 MCG/ML IJ SOSY
25.0000 ug | PREFILLED_SYRINGE | INTRAMUSCULAR | Status: AC | PRN
  Administered 2021-08-19 (×2): 25 ug via INTRAVENOUS
  Filled 2021-08-19 (×2): qty 1

## 2021-08-19 MED ORDER — PROPOFOL 10 MG/ML IV BOLUS
INTRAVENOUS | Status: DC | PRN
  Administered 2021-08-19: 180 mg via INTRAVENOUS

## 2021-08-19 MED ORDER — SODIUM CHLORIDE 0.9 % IV SOLN
INTRAVENOUS | Status: DC | PRN
  Administered 2021-08-19: 500 mL

## 2021-08-19 MED ORDER — FENTANYL CITRATE (PF) 250 MCG/5ML IJ SOLN
INTRAMUSCULAR | Status: AC
  Filled 2021-08-19: qty 5

## 2021-08-19 MED ORDER — PROMETHAZINE HCL 25 MG/ML IJ SOLN: 6.2500 mg | INTRAMUSCULAR | Status: DC | PRN

## 2021-08-19 MED ORDER — HEMOSTATIC AGENTS (NO CHARGE) OPTIME
TOPICAL | Status: DC | PRN
  Administered 2021-08-19: 1 via TOPICAL

## 2021-08-19 MED ORDER — PHENYLEPHRINE HCL (PRESSORS) 10 MG/ML IV SOLN
INTRAVENOUS | Status: DC | PRN
  Administered 2021-08-19 (×4): 100 ug via INTRAVENOUS

## 2021-08-19 MED ORDER — PROPOFOL 10 MG/ML IV BOLUS
INTRAVENOUS | Status: AC
  Filled 2021-08-19: qty 20

## 2021-08-19 MED ORDER — LIDOCAINE HCL (CARDIAC) PF 100 MG/5ML IV SOSY
PREFILLED_SYRINGE | INTRAVENOUS | Status: DC | PRN
  Administered 2021-08-19: 80 mg via INTRAVENOUS

## 2021-08-19 MED ORDER — MIDAZOLAM HCL 5 MG/5ML IJ SOLN
INTRAMUSCULAR | Status: DC | PRN
  Administered 2021-08-19: 2 mg via INTRAVENOUS

## 2021-08-19 MED ORDER — FENTANYL CITRATE (PF) 100 MCG/2ML IJ SOLN
INTRAMUSCULAR | Status: DC | PRN
  Administered 2021-08-19: 100 ug via INTRAVENOUS

## 2021-08-19 MED ORDER — SODIUM CHLORIDE 0.9 % IV SOLN: INTRAVENOUS | Status: DC

## 2021-08-19 MED ORDER — PHENYLEPHRINE 40 MCG/ML (10ML) SYRINGE FOR IV PUSH (FOR BLOOD PRESSURE SUPPORT)
PREFILLED_SYRINGE | INTRAVENOUS | Status: AC
  Filled 2021-08-19: qty 10

## 2021-08-19 MED ORDER — CHLORHEXIDINE GLUCONATE 0.12 % MT SOLN
15.0000 mL | Freq: Once | OROMUCOSAL | Status: AC
  Administered 2021-08-19: 15 mL via OROMUCOSAL
  Filled 2021-08-19: qty 15

## 2021-08-19 MED ORDER — EPHEDRINE 5 MG/ML INJ
INTRAVENOUS | Status: AC
  Filled 2021-08-19: qty 5

## 2021-08-19 MED ORDER — MIDAZOLAM HCL 2 MG/2ML IJ SOLN
INTRAMUSCULAR | Status: AC
  Filled 2021-08-19: qty 2

## 2021-08-19 MED ORDER — HYDROMORPHONE HCL 1 MG/ML IJ SOLN
1.0000 mg | INTRAMUSCULAR | Status: DC | PRN
  Administered 2021-08-19 – 2021-08-24 (×17): 1 mg via INTRAVENOUS
  Filled 2021-08-19 (×17): qty 1

## 2021-08-19 MED ORDER — FENTANYL CITRATE (PF) 100 MCG/2ML IJ SOLN
25.0000 ug | INTRAMUSCULAR | Status: DC | PRN
  Administered 2021-08-19: 50 ug via INTRAVENOUS
  Administered 2021-08-19 (×2): 25 ug via INTRAVENOUS

## 2021-08-19 MED ORDER — EPHEDRINE SULFATE 50 MG/ML IJ SOLN
INTRAMUSCULAR | Status: DC | PRN
  Administered 2021-08-19 (×5): 10 mg via INTRAVENOUS

## 2021-08-19 SURGICAL SUPPLY — 45 items
BAG COUNTER SPONGE SURGICOUNT (BAG) ×2 IMPLANT
BLADE CLIPPER SURG (BLADE) IMPLANT
BNDG ELASTIC 4X5.8 VLCR STR LF (GAUZE/BANDAGES/DRESSINGS) IMPLANT
BNDG GAUZE ELAST 4 BULKY (GAUZE/BANDAGES/DRESSINGS) IMPLANT
CANISTER SUCT 3000ML PPV (MISCELLANEOUS) ×2 IMPLANT
CHLORAPREP W/TINT 26 (MISCELLANEOUS) IMPLANT
COVER SURGICAL LIGHT HANDLE (MISCELLANEOUS) ×2 IMPLANT
DRAPE DERMATAC (DRAPES) ×2 IMPLANT
DRAPE HALF SHEET 40X57 (DRAPES) IMPLANT
DRAPE INCISE IOBAN 66X45 STRL (DRAPES) IMPLANT
DRAPE ORTHO SPLIT 77X108 STRL (DRAPES)
DRAPE SURG ORHT 6 SPLT 77X108 (DRAPES) IMPLANT
DRESSING HYDROCOLLOID 4X4 (GAUZE/BANDAGES/DRESSINGS) ×2 IMPLANT
DRSG ADAPTIC 3X8 NADH LF (GAUZE/BANDAGES/DRESSINGS) IMPLANT
DRSG CUTIMED SORBACT 7X9 (GAUZE/BANDAGES/DRESSINGS) ×1 IMPLANT
DRSG PAD ABDOMINAL 8X10 ST (GAUZE/BANDAGES/DRESSINGS) IMPLANT
DRSG VAC ATS LRG SENSATRAC (GAUZE/BANDAGES/DRESSINGS) IMPLANT
DRSG VAC ATS MED SENSATRAC (GAUZE/BANDAGES/DRESSINGS) ×1 IMPLANT
DRSG VAC ATS SM SENSATRAC (GAUZE/BANDAGES/DRESSINGS) IMPLANT
ELECT REM PT RETURN 9FT ADLT (ELECTROSURGICAL) ×2
ELECTRODE REM PT RTRN 9FT ADLT (ELECTROSURGICAL) ×1 IMPLANT
GAUZE SPONGE 4X4 12PLY STRL (GAUZE/BANDAGES/DRESSINGS) IMPLANT
GEL ULTRASOUND 20GR AQUASONIC (MISCELLANEOUS) IMPLANT
GLOVE SURG ENC MOIS LTX SZ6.5 (GLOVE) ×4 IMPLANT
GLOVE SURG ENC TEXT LTX SZ7.5 (GLOVE) ×2 IMPLANT
GLOVE SURG UNDER LTX SZ8 (GLOVE) ×2 IMPLANT
GOWN STRL REUS W/ TWL LRG LVL3 (GOWN DISPOSABLE) ×2 IMPLANT
GOWN STRL REUS W/TWL LRG LVL3 (GOWN DISPOSABLE) ×4
GRAFT MYRIAD 3 LAYER 7X10 (Graft) ×1 IMPLANT
HANDPIECE INTERPULSE COAX TIP (DISPOSABLE)
HEMOSTAT ARISTA ABSORB 3G PWDR (HEMOSTASIS) ×1 IMPLANT
KIT BASIN OR (CUSTOM PROCEDURE TRAY) ×2 IMPLANT
KIT TURNOVER KIT B (KITS) ×2 IMPLANT
NS IRRIG 1000ML POUR BTL (IV SOLUTION) ×2 IMPLANT
PACK GENERAL/GYN (CUSTOM PROCEDURE TRAY) ×2 IMPLANT
PAD ARMBOARD 7.5X6 YLW CONV (MISCELLANEOUS) ×4 IMPLANT
PAD NEG PRESSURE SENSATRAC (MISCELLANEOUS) IMPLANT
POWDER MYRIAD MORCELLS 1000MG (Miscellaneous) ×1 IMPLANT
SET HNDPC FAN SPRY TIP SCT (DISPOSABLE) IMPLANT
SUT SILK 4 0 PS 2 (SUTURE) IMPLANT
SUT VIC AB 4-0 PS2 18 (SUTURE) ×2 IMPLANT
SUT VIC AB 5-0 PS2 18 (SUTURE) IMPLANT
TOWEL GREEN STERILE (TOWEL DISPOSABLE) ×2 IMPLANT
TOWEL GREEN STERILE FF (TOWEL DISPOSABLE) IMPLANT
UNDERPAD 30X36 HEAVY ABSORB (UNDERPADS AND DIAPERS) IMPLANT

## 2021-08-19 NOTE — Transfer of Care (Signed)
Immediate Anesthesia Transfer of Care Note  Patient: [name]  Procedure(s) Performed: Debridement of left thigh wound (Left: Thigh) APPLICATION OF SKIN SUBSTITUTE (Left: Thigh) APPLICATION OF WOUND VAC (Left: Thigh)  Patient Location: PACU  Anesthesia Type:General  Level of Consciousness: awake, alert , oriented and patient cooperative  Airway & Oxygen Therapy: Patient Spontanous Breathing and Patient connected to face mask oxygen  Post-op Assessment: Report given to RN and Post -op Vital signs reviewed and stable  Post vital signs: Reviewed and stable  Last Vitals:  Vitals Value Taken Time  BP 130/72 08/19/21 1655  Temp    Pulse    Resp 16 08/19/21 1656  SpO2    Vitals shown include unvalidated device data.  Last Pain:  Vitals:   08/19/21 1408  TempSrc:   PainSc: 6       Patients Stated Pain Goal: 2 (55/83/16 7425)  Complications: No notable events documented.

## 2021-08-19 NOTE — Procedures (Signed)
I was present at this dialysis session. I have reviewed the session itself and made appropriate changes.   Vital signs in last 24 hours:  Temp:  [97.6 F (36.4 C)-98.1 F (36.7 C)] 97.7 F (36.5 C) (08/31 0730) Pulse Rate:  [60-66] 63 (08/31 0800) Resp:  [17-23] 17 (08/31 0800) BP: (112-147)/(66-81) 129/71 (08/31 0800) SpO2:  [97 %-100 %] 100 % (08/31 0730) Weight:  [124.8 kg-126.3 kg] 124.8 kg (08/31 0730) Weight change: -2 kg Filed Weights   08/17/21 1750 08/18/21 2116 08/19/21 0730  Weight: 126.3 kg 126.3 kg 124.8 kg    Recent Labs  Lab 08/17/21 0721  NA 130*  K 4.4  CL 93*  CO2 21*  GLUCOSE 120*  BUN 76*  CREATININE 7.41*  CALCIUM 8.1*  PHOS 4.8*    Recent Labs  Lab 08/12/21 0843 08/17/21 0721 08/19/21 0811  WBC 6.4 9.8 9.7  NEUTROABS 4.3  --   --   HGB 10.7* 11.5* 11.5*  HCT 33.5* 34.4* 34.8*  MCV 92.0 89.8 91.1  PLT 218 210 228    Scheduled Meds:  amiodarone  200 mg Oral Daily   apixaban  5 mg Oral BID   cinacalcet  30 mg Oral Q M,W,F-HD   collagenase   Topical Daily   [START ON 08/24/2021] darbepoetin (ARANESP) injection - DIALYSIS  25 mcg Intravenous Q Mon-HD   DULoxetine  20 mg Oral Daily   feeding supplement (NEPRO CARB STEADY)  237 mL Oral TID BM   insulin aspart  0-15 Units Subcutaneous TID WC   insulin aspart  0-5 Units Subcutaneous QHS   mouth rinse  15 mL Mouth Rinse BID   metoprolol tartrate  12.5 mg Oral BID   midodrine  15 mg Oral TID WC   multivitamin  1 tablet Oral QHS   pantoprazole  40 mg Oral BID   polyethylene glycol  17 g Oral Daily   senna  1 tablet Oral Daily   Continuous Infusions:  sodium chloride      ceFAZolin (ANCEF) IV     promethazine (PHENERGAN) injection (IM or IVPB) 12.5 mg (08/08/21 0901)   PRN Meds:.acetaminophen, dextromethorphan-guaiFENesin, diphenhydrAMINE, docusate sodium, fentaNYL (SUBLIMAZE) injection, HYDROmorphone, ipratropium-albuterol, ondansetron (ZOFRAN) IV, polyethylene glycol, promethazine  (PHENERGAN) injection (IM or IVPB)   Donetta Potts,  MD 08/19/2021, 8:34 AM

## 2021-08-19 NOTE — Progress Notes (Signed)
PROGRESS NOTE  [name]  DOB: [birth year]  PCP: Noreene Larsson, NP XYI:[id number]  DOA: 06/21/2021  LOS: 41 days  Hospital Day: 27   Chief Complaint  Patient presents with   Hypotension    Brief narrative: [name] is a 55 y.o. female with PMH significant for ESRD on HD, morbid obesity, DM2, HTN, CHF, chronic lymphedema, COPD. Patient presented to the ED on 06/21/2021 with complaint of right hip and leg pain with evidence of cellulitis.   She was started on empiric antibiotics but continued to have issues with hypotension with evidence of cardiogenic shock from biventricular failure and pulmonary hypertension.  She was managed in the ICU with vasopressors and hemodialysis. See below for details.  Subjective: Patient was seen and examined this morning. Sitting up in bed.  Back from dialysis.  Was in pain.  IV Dilaudid given.   Underwent wound evaluation by plastic surgery today.  Assessment/Plan: Cardiogenic shock -resolved Acute exacerbation of chronic right ventricular failure with pulmonary hypertension -patient required Vasopressor support in ICU.  Currently on midodrine.   -EF 55 to 60% on last echo from 7/15   ESRD-HD-MWF Chronic hypertension -Nephrology following.  Noted that inpatient dialysis have been challenging because of recurrent hypotension. Encouraged to try and prolong the duration of sitting in a recliner. -Continue midodrine.  Prolonged bleeding/oozing from AV fistula -8/25, patient underwent fistulogram by IR.  Did have tortuous AV fistula but no evidence of bleeding.  Continue apixaban.  If she starts to bleed, need to cut back on apixaban from 5 mg to 2.5 mg.    Left medial thigh wound -Seen by plastic surgery.  Patient was previously planned for debridement by plastic surgery today on 8/31.  Plastic surgery note today states that patient is a poor surgical candidate and recommends local wound care.   Infected RUE midline catheter -Midline  removed and patient treated with linezolid for 5 days. Resolved.   Atypical atrial flutter -Patient underwent DCCV on 7/15 with conversion to normal sinus rhythm. -Continue amiodarone.  On 8/28, patient was having intermittent tachycardia and hence I added Metoprolol 12.5 mg twice daily.  Heart rate remained stable.  Continue Eliquis for anticoagulation.    Possible OSA/OHS Pulmonary hypertension -Started on CPAP during hospitalization -Continue CPAP with recommendation for outpatient sleep study   Fibromyalgia -Continue Cymbalta   Anemia of chronic disease -Hemoglobin stable. Recent Labs    07/24/21 0838 07/27/21 0703 07/29/21 0040 07/31/21 0037 08/04/21 0043 08/06/21 1344 08/10/21 0752 08/12/21 0843 08/17/21 0721 08/19/21 0811  HGB 10.7* 10.4* 9.9* 10.5* 11.0* 11.3* 10.5* 10.7* 11.5* 11.5*     Diabetes mellitus, type 2 -Hemoglobin A1c of 6.9%. Not on any antidiabetic at this time. -Blood sugar level is better in last 24 hours.  Continue to monitor. Recent Labs  Lab 08/18/21 1614 08/18/21 2117 08/19/21 0637 08/19/21 1129 08/19/21 1318  GLUCAP 144* 186* 147* 98 105*     Morbid obesity  -Body mass index is 45.78 kg/m. Patient has been advised to make an attempt to improve diet and exercise patterns to aid in weight loss.   Weakness PT/OT recommended SNF.     Pressure injury Right/posterior/proximal thigh. Not present on admission.   Mobility: PT eval obtained Code Status:   Code Status: Full Code  Nutritional status: Body mass index is 45.78 kg/m. Nutrition Problem: Increased nutrient needs Etiology: wound healing Signs/Symptoms: estimated needs Diet:  Diet Order  Diet NPO time specified  Diet effective midnight           Diet NPO time specified  Diet effective midnight                  DVT prophylaxis:SCD's Start: 08/19/21 1350 apixaban (ELIQUIS) tablet 5 mg    Antimicrobials: None Fluid: None Consultants: Nephrology Family  Communication: None at bedside  Status is: Inpatient  Remains inpatient appropriate because -pending SNF  Dispo: The patient is from: Home              Anticipated d/c is to: SNF              Patient currently is medically stable to d/c.   Difficult to place patient No     Infusions:   sodium chloride     sodium chloride 10 mL/hr at 08/19/21 1411    ceFAZolin (ANCEF) IV     [MAR Hold] promethazine (PHENERGAN) injection (IM or IVPB) 12.5 mg (08/08/21 0901)    Scheduled Meds:  [MAR Hold] amiodarone  200 mg Oral Daily   [MAR Hold] apixaban  5 mg Oral BID   [MAR Hold] cinacalcet  30 mg Oral Q M,W,F-HD   [MAR Hold] collagenase   Topical Daily   [MAR Hold] darbepoetin (ARANESP) injection - DIALYSIS  25 mcg Intravenous Q Mon-HD   [MAR Hold] DULoxetine  20 mg Oral Daily   [MAR Hold] feeding supplement (NEPRO CARB STEADY)  237 mL Oral TID BM   [MAR Hold] insulin aspart  0-15 Units Subcutaneous TID WC   [MAR Hold] insulin aspart  0-5 Units Subcutaneous QHS   [MAR Hold] mouth rinse  15 mL Mouth Rinse BID   [MAR Hold] metoprolol tartrate  12.5 mg Oral BID   [MAR Hold] midodrine  15 mg Oral TID WC   [MAR Hold] multivitamin  1 tablet Oral QHS   [MAR Hold] pantoprazole  40 mg Oral BID   [MAR Hold] polyethylene glycol  17 g Oral Daily   [MAR Hold] senna  1 tablet Oral Daily    Antimicrobials: Anti-infectives (From admission, onward)    Start     Dose/Rate Route Frequency Ordered Stop   08/19/21 0600  ceFAZolin (ANCEF) IVPB 3g/100 mL premix        3 g 200 mL/hr over 30 Minutes Intravenous On call to O.R. 08/18/21 2051 08/20/21 0559   08/06/21 1530  piperacillin-tazobactam (ZOSYN) IVPB 2.25 g        2.25 g 100 mL/hr over 30 Minutes Intravenous Every 8 hours 08/06/21 1434 08/10/21 2347   07/16/21 2000  DAPTOmycin (CUBICIN) 500 mg in sodium chloride 0.9 % IVPB  Status:  Discontinued        6 mg/kg  87.4 kg (Adjusted) 120 mL/hr over 30 Minutes Intravenous Every 48 hours 07/15/21 1138  07/16/21 1043   07/16/21 1200  linezolid (ZYVOX) tablet 600 mg        600 mg Oral Every 12 hours 07/16/21 1101 07/21/21 2018   07/15/21 1000  ceFEPIme (MAXIPIME) 1 g in sodium chloride 0.9 % 100 mL IVPB  Status:  Discontinued        1 g 200 mL/hr over 30 Minutes Intravenous Every 24 hours 07/15/21 0913 07/16/21 1043   07/15/21 0900  cefTRIAXone (ROCEPHIN) 1 g in sodium chloride 0.9 % 100 mL IVPB  Status:  Discontinued        1 g 200 mL/hr over 30 Minutes Intravenous Every 24 hours 07/15/21 0752 07/15/21  8938   07/15/21 0500  DAPTOmycin (CUBICIN) 500 mg in sodium chloride 0.9 % IVPB  Status:  Discontinued        6 mg/kg  87.4 kg (Adjusted) 120 mL/hr over 30 Minutes Intravenous Every 48 hours 07/15/21 0404 07/15/21 1138   06/23/21 0930  DAPTOmycin (CUBICIN) 500 mg in sodium chloride 0.9 % IVPB        6 mg/kg  86.2 kg (Adjusted) 120 mL/hr over 30 Minutes Intravenous Every 48 hours 06/23/21 0831 06/28/21 0130   06/23/21 0915  DAPTOmycin (CUBICIN) 800 mg in sodium chloride 0.9 % IVPB  Status:  Discontinued        6 mg/kg  130.1 kg 132 mL/hr over 30 Minutes Intravenous Every 48 hours 06/23/21 0825 06/23/21 0831   06/22/21 2200  ceFEPIme (MAXIPIME) 1 g in sodium chloride 0.9 % 100 mL IVPB  Status:  Discontinued        1 g 200 mL/hr over 30 Minutes Intravenous Every 24 hours 06/22/21 0001 06/23/21 0825   06/22/21 0800  metroNIDAZOLE (FLAGYL) IVPB 500 mg  Status:  Discontinued        500 mg 100 mL/hr over 60 Minutes Intravenous Every 8 hours 06/22/21 0716 06/23/21 0825   06/21/21 2230  doxycycline (VIBRAMYCIN) 100 mg in sodium chloride 0.9 % 250 mL IVPB  Status:  Discontinued        100 mg 125 mL/hr over 120 Minutes Intravenous Every 12 hours 06/21/21 2223 06/23/21 0825   06/21/21 2215  ceFEPIme (MAXIPIME) 2 g in sodium chloride 0.9 % 100 mL IVPB        2 g 200 mL/hr over 30 Minutes Intravenous  Once 06/21/21 2208 06/21/21 2245   06/21/21 2215  metroNIDAZOLE (FLAGYL) IVPB 500 mg        500  mg 100 mL/hr over 60 Minutes Intravenous  Once 06/21/21 2208 06/21/21 2340       PRN meds: [MAR Hold] acetaminophen, [MAR Hold] dextromethorphan-guaiFENesin, [MAR Hold] diphenhydrAMINE, [MAR Hold] docusate sodium, [MAR Hold]  HYDROmorphone (DILAUDID) injection, [MAR Hold] HYDROmorphone, [MAR Hold] ipratropium-albuterol, [MAR Hold] ondansetron (ZOFRAN) IV, [MAR Hold] polyethylene glycol, [MAR Hold] promethazine (PHENERGAN) injection (IM or IVPB)   Objective: Vitals:   08/19/21 1030 08/19/21 1353  BP: (!) 114/58 136/62  Pulse: 69 62  Resp: 19 18  Temp:  97.8 F (36.6 C)  SpO2:  95%    Intake/Output Summary (Last 24 hours) at 08/19/2021 1440 Last data filed at 08/19/2021 0200 Gross per 24 hour  Intake 240 ml  Output 0 ml  Net 240 ml    Filed Weights   08/17/21 1750 08/18/21 2116 08/19/21 0730  Weight: 126.3 kg 126.3 kg 124.8 kg   Weight change: -2 kg Body mass index is 45.78 kg/m.   Physical Exam: General exam: Pleasant, middle-aged African-American female.  Morbidly obese Skin: No rashes, lesions or ulcers. HEENT: Atraumatic, normocephalic, no obvious bleeding Lungs: Clear to auscultation bilaterally CVS: Regular rate and rhythm, no murmur GI/Abd soft, distended, nontender, bowel sound present CNS: Alert, awake and oriented x3 Psychiatry: Mood appropriate Extremities: Chronic lymphedema  Data Review: I have personally reviewed the laboratory data and studies available.  Recent Labs  Lab 08/17/21 0721 08/19/21 0811  WBC 9.8 9.7  HGB 11.5* 11.5*  HCT 34.4* 34.8*  MCV 89.8 91.1  PLT 210 228    Recent Labs  Lab 08/17/21 0721 08/19/21 0811  NA 130* 133*  K 4.4 3.5  CL 93* 95*  CO2 21* 24  GLUCOSE  120* 128*  BUN 76* 46*  CREATININE 7.41* 5.20*  CALCIUM 8.1* 8.1*  PHOS 4.8* 3.3     F/u labs ordered Unresulted Labs (From admission, onward)     Start     Ordered   08/23/21 0500  CBC  Once,   R       Question:  Specimen collection method  Answer:   Lab=Lab collect   08/18/21 1523   Signed and Held  Renal function panel  Once,   R       Question:  Specimen collection method  Answer:  Lab=Lab collect   Signed and Held   Signed and Held  CBC  Once,   R       Question:  Specimen collection method  Answer:  Lab=Lab collect   Signed and Held            Signed, Terrilee Croak, MD [facility] 08/19/2021

## 2021-08-19 NOTE — Progress Notes (Signed)
PT Cancellation Note  Patient Details Name: [name] MRN: [id number] DOB: [birth year]   Cancelled Treatment:    Reason Eval/Treat Not Completed: Patient at procedure or test/unavailable. Pt off unit for plastic surgery. PT will follow up when pt has returned and is appropriate for PT.   Zenaida Niece 08/19/2021, 5:10 PM

## 2021-08-19 NOTE — Anesthesia Preprocedure Evaluation (Signed)
Anesthesia Evaluation  Patient identified by MRN, date of birth, ID band Patient awake    Reviewed: Allergy & Precautions, NPO status , Patient's Chart, lab work & pertinent test results  Airway Mallampati: II  TM Distance: >3 FB Neck ROM: Full    Dental  (+) Teeth Intact   Pulmonary asthma , sleep apnea , COPD,    Pulmonary exam normal        Cardiovascular hypertension, + CAD and +CHF   Rhythm:Regular Rate:Normal     Neuro/Psych Anxiety negative neurological ROS     GI/Hepatic negative GI ROS, (+) Hepatitis -  Endo/Other  diabetes, Type 2  Renal/GU CRFRenal disease  negative genitourinary   Musculoskeletal  (+) Arthritis , Fibromyalgia -Left leg cellulitis    Abdominal (+) + obese,   Peds  Hematology  (+) anemia ,   Anesthesia Other Findings   Reproductive/Obstetrics                             Anesthesia Physical Anesthesia Plan  ASA: 3  Anesthesia Plan: General   Post-op Pain Management:    Induction: Intravenous  PONV Risk Score and Plan: 3 and Ondansetron, Dexamethasone, Midazolam and Treatment may vary due to age or medical condition  Airway Management Planned: Mask and LMA  Additional Equipment: None  Intra-op Plan:   Post-operative Plan: Extubation in OR  Informed Consent: I have reviewed the patients History and Physical, chart, labs and discussed the procedure including the risks, benefits and alternatives for the proposed anesthesia with the patient or authorized representative who has indicated his/her understanding and acceptance.     Dental advisory given  Plan Discussed with: CRNA  Anesthesia Plan Comments: (Lab Results      Component                Value               Date                      WBC                      9.7                 08/19/2021                HGB                      11.5 (L)            08/19/2021                HCT                       34.8 (L)            08/19/2021                MCV                      91.1                08/19/2021                PLT                      228  08/19/2021           Lab Results      Component                Value               Date                      NA                       133 (L)             08/19/2021                K                        3.5                 08/19/2021                CO2                      24                  08/19/2021                GLUCOSE                  128 (H)             08/19/2021                BUN                      46 (H)              08/19/2021                CREATININE               5.20 (H)            08/19/2021                CALCIUM                  8.1 (L)             08/19/2021                GFRNONAA                 9 (L)               08/19/2021                GFRAA                    5 (L)               07/04/2020                GFRAA                    5 (L)               07/04/2020           ECHO 07/22: 1. Small LV, compressed by enlarged RV. Mild LV hypertrophy. EF 55-60%,  normal wall motion.  2. Severely dilated  and moderately dysfunctional RV, D-shaped septum  consistent with RV pressure/volume overload.  3. Left atrial size was mildly dilated. No left atrial/left atrial  appendage thrombus was detected.  4. Right atrial size was severely dilated.  5. No PFO/ASD by color doppler.  6. Peak RV-RA gradient 55 mmHg . The tricuspid valve is abnormal.  Tricuspid valve regurgitation is moderate.  7. The mitral valve is normal in structure. Trivial mitral valve  regurgitation. No evidence of mitral stenosis.  8. The aortic valve is tricuspid. Aortic valve regurgitation is trivial.  No aortic stenosis is present.  9. Normal caliber thoracic aorta with grade 3 plaque noted. )        Anesthesia Quick Evaluation

## 2021-08-19 NOTE — Op Note (Signed)
DATE OF OPERATION: 08/19/2021  LOCATION: [facility] Inpatient  PREOPERATIVE DIAGNOSIS: left thigh wound  POSTOPERATIVE DIAGNOSIS: Same  PROCEDURE: Excision of left thigh wound 5 x 10 x 1 cm skin, soft tissue and fat.  Placement of Myriad powder 1 gm and sheet 7 x 10 cm.  Placement of VAC  SURGEON: [employer], DO  ASSISTANT:  [name], PA  EBL: 10 cc  CONDITION: Stable  COMPLICATIONS: None  INDICATION: The patient, [name], is a 55 y.o. female born on 10/15/1966, is here for treatment of a left thigh wound.   PROCEDURE DETAILS:  The patient was seen prior to surgery and marked.  The IV antibiotics were given. The patient was taken to the operating room and given a general anesthetic. A standard time out was performed and all information was confirmed by those in the room. The left thigh was prepped and draped.  The wound was 5 x 10 x 1 cm in size.  There was nonviable skin, soft tissue and fat in the entire wound.  The #10 blade and bovie were used to excise the entire area.  Hemostasis was achieved with electrocautery.  There was an ooze in the wound.  The hemostatic agent was used.  This improved the ooze significantly.  All of the Myriad powder and sheet was placed and secured with the 5-0 Vicryl.  The sorbact was placed and secured with the 5-0 Vicryl.  The VAC was applied and there was an excellent seal.  The patient was allowed to wake up and taken to recovery room in stable condition at the end of the case. The family was notified at the end of the case.   The advanced practice practitioner (APP) assisted throughout the case.  The APP was essential in retraction and counter traction when needed to make the case progress smoothly.  This retraction and assistance made it possible to see the tissue plans for the procedure.  The assistance was needed for blood control, tissue re-approximation and assisted with closure of the incision site.

## 2021-08-19 NOTE — Interval H&P Note (Signed)
History and Physical Interval Note:  08/19/2021 2:24 PM  [name]  has presented today for surgery, with the diagnosis of cellulitis of leg.  The various methods of treatment have been discussed with the patient and family. After consideration of risks, benefits and other options for treatment, the patient has consented to  Procedure(s): Debridement of left thigh wound (Left) APPLICATION OF SKIN SUBSTITUTE (Left) APPLICATION OF WOUND VAC (Left) as a surgical intervention.  The patient's history has been reviewed, patient examined, no change in status, stable for surgery.  I have reviewed the patient's chart and labs.  Questions were answered to the patient's satisfaction.     Loel Lofty Haile Toppins

## 2021-08-19 NOTE — Anesthesia Procedure Notes (Signed)
Procedure Name: LMA Insertion Date/Time: 08/19/2021 3:59 PM Performed by: Jonna Munro, CRNA Pre-anesthesia Checklist: Patient identified, Emergency Drugs available, Suction available, Timeout performed and Patient being monitored Patient Re-evaluated:Patient Re-evaluated prior to induction Oxygen Delivery Method: Circle system utilized Preoxygenation: Pre-oxygenation with 100% oxygen Induction Type: IV induction LMA: LMA inserted LMA Size: 4.0 Number of attempts: 1 Placement Confirmation: positive ETCO2 and breath sounds checked- equal and bilateral Tube secured with: Tape Dental Injury: Teeth and Oropharynx as per pre-operative assessment

## 2021-08-19 NOTE — Progress Notes (Signed)
OT Cancellation Note  Patient Details Name: [name] MRN: [id number] DOB: 1966-06-17   Cancelled Treatment:    Reason Eval/Treat Not Completed: Patient at procedure or test/ unavailable (HD/sx). Spoke with RN. Pt is currently in HD for the morning, and has planned I&D this afternoon. OT will continue to follow acutely.   [place] 08/19/2021, 8:09 AM  [name] OTR/L Acute Rehabilitation Services Pager: [phone] Office: [phone]

## 2021-08-20 ENCOUNTER — Encounter (HOSPITAL_COMMUNITY): Payer: Self-pay | Admitting: Plastic Surgery

## 2021-08-20 DIAGNOSIS — I9589 Other hypotension: Secondary | ICD-10-CM | POA: Diagnosis not present

## 2021-08-20 DIAGNOSIS — L03119 Cellulitis of unspecified part of limb: Secondary | ICD-10-CM | POA: Diagnosis not present

## 2021-08-20 LAB — GLUCOSE, CAPILLARY
Glucose-Capillary: 182 mg/dL — ABNORMAL HIGH (ref 70–99)
Glucose-Capillary: 160 mg/dL — ABNORMAL HIGH (ref 70–99)
Glucose-Capillary: 197 mg/dL — ABNORMAL HIGH (ref 70–99)
Glucose-Capillary: 133 mg/dL — ABNORMAL HIGH (ref 70–99)

## 2021-08-20 MED ORDER — METHOCARBAMOL 500 MG PO TABS
750.0000 mg | ORAL_TABLET | Freq: Three times a day (TID) | ORAL | Status: AC
  Administered 2021-08-20 – 2021-08-22 (×7): 750 mg via ORAL
  Filled 2021-08-20 (×7): qty 2

## 2021-08-20 MED ORDER — HYDROCODONE-ACETAMINOPHEN 10-325 MG PO TABS
1.0000 | ORAL_TABLET | Freq: Four times a day (QID) | ORAL | Status: AC
  Administered 2021-08-20 – 2021-08-22 (×7): 1 via ORAL
  Filled 2021-08-20 (×7): qty 1

## 2021-08-20 NOTE — Progress Notes (Signed)
Physical Therapy Treatment Patient Details Name: [name] MRN: [id number] DOB: 14-Jul-1966 Today's Date: 08/20/2021    History of Present Illness 55 y.o. female presenting from [place] ED 7/3 with lethargy, hypotension and elevated lactic acid after d/c from AP earlier that day with septic shock and cellulitis. Patient found to have AMS, septic shock, acute respiratory failure, acute on chronic RV failure. Transferred to ICU on 7/12 due to hypotension. Pt underwent graft and wound vac placement with plastic surgery 08/19/2021. PMHX significant for obesity, DMII, COPD, CAD, lymphedema, ESRD on HD MWF and CHF.    PT Comments    Pt remains limited by L thigh pain this session. Pt participates in LE and UE exercise in tilted position in bed however limited again by pain and reports of dizziness. Pt will benefit from continued aggressive mobilization to improve activity tolerance and reduce caregiver burden.   Follow Up Recommendations  SNF     Equipment Recommendations  Hospital bed (hoyer lift)    Recommendations for Other Services       Precautions / Restrictions Precautions Precautions: Fall Precaution Comments: bil LE edema, L thigh wound vac Restrictions Weight Bearing Restrictions: No    Mobility  Bed Mobility Overal bed mobility: Needs Assistance             General bed mobility comments: Mod A +2, sliding in bed for positioning in supine in prep for tilting. Pt pulling through railings to assist Start Time: 1035 Angle: 49 degrees Total Minutes in Angle: 3 minutes Patient Response: Cooperative  Transfers Overall transfer level: Needs assistance               General transfer comment: Tilted to 49 degrees for BUE/BLE HEP. Patient declined EOB transfer 2/2 pain.  Ambulation/Gait                 Stairs             Wheelchair Mobility    Modified Rankin (Stroke Patients Only)       Balance Overall balance assessment: Needs assistance          Standing balance support: Bilateral upper extremity supported Standing balance-Leahy Scale: Zero Standing balance comment: tilt in kreg bed, straps on LEs to support as well as UE support of railing.                            Cognition Arousal/Alertness: Awake/alert Behavior During Therapy: WFL for tasks assessed/performed Overall Cognitive Status: Within Functional Limits for tasks assessed                                 General Comments: very motivated      Exercises General Exercises - Upper Extremity Shoulder Flexion: Strengthening;Both;10 reps;Theraband;Standing Theraband Level (Shoulder Flexion): Other (comment) (level 2 orange) Shoulder Horizontal ADduction: Strengthening;Both;10 reps;Theraband;Standing Theraband Level (Shoulder Horizontal Adduction): Other (comment) (orange) Elbow Flexion: Strengthening;Both;10 reps;Standing;Theraband Theraband Level (Elbow Flexion): Other (comment) (orange) Other Exercises Other Exercises: pt performs marching of 10 steps with each foot in 50 degree tilt. Pt also performs 10 reps of lateral and medial stepping in tilt bed    General Comments General comments (skin integrity, edema, etc.): pt tilting to 50 degrees initially with good response, performing stepping exercise in standing. Pt reports pain and is recliner to 35 degrees for UE exercise. Pt then requests to return to standing, raised to  48 degrees with reports of dizziness. Pt recliner to 30 degrees with improvement in symptoms, unable to obtain BP as cuff too small for upper arm and PIV on forearm.      Pertinent Vitals/Pain Pain Assessment: 0-10 Pain Score: 8  Pain Location: L thigh Pain Descriptors / Indicators: Burning;Aching Pain Intervention(s): Monitored during session    Home Living                      Prior Function            PT Goals (current goals can now be found in the care plan section) Acute Rehab PT  Goals Patient Stated Goal: get stronger and return home; pivot to chair next time PT Goal Formulation: With patient Time For Goal Achievement: 09/03/21 Potential to Achieve Goals: Fair Additional Goals Additional Goal #1: Pt will tolerate standing with BUE support of walker for 2-3 minutes, with initiation of weight shifting, with supervision Progress towards PT goals: Progressing toward goals    Frequency    Min 2X/week      PT Plan Current plan remains appropriate    Co-evaluation PT/OT/SLP Co-Evaluation/Treatment: Yes Reason for Co-Treatment: For patient/therapist safety PT goals addressed during session: Mobility/safety with mobility;Balance;Strengthening/ROM OT goals addressed during session: Strengthening/ROM      AM-PAC PT "6 Clicks" Mobility   Outcome Measure  Help needed turning from your back to your side while in a flat bed without using bedrails?: A Little Help needed moving from lying on your back to sitting on the side of a flat bed without using bedrails?: A Little Help needed moving to and from a bed to a chair (including a wheelchair)?: A Lot Help needed standing up from a chair using your arms (e.g., wheelchair or bedside chair)?: Total Help needed to walk in hospital room?: Total Help needed climbing 3-5 steps with a railing? : Total 6 Click Score: 11    End of Session   Activity Tolerance: Patient limited by pain Patient left: in bed;with call bell/phone within reach Nurse Communication: Mobility status;Need for lift equipment PT Visit Diagnosis: Other abnormalities of gait and mobility (R26.89);Muscle weakness (generalized) (M62.81);Pain Pain - Right/Left: Left Pain - part of body: Leg     Time: 8466-5993 PT Time Calculation (min) (ACUTE ONLY): 37 min  Charges:  $Therapeutic Activity: 8-22 mins                     Zenaida Niece, PT, DPT Acute Rehabilitation Pager: [phone]    Zenaida Niece 08/20/2021, 12:28 PM

## 2021-08-20 NOTE — Progress Notes (Signed)
Occupational Therapy Treatment Patient Details Name: [name] MRN: [id number] DOB: 08/14/1966 Today's Date: 08/20/2021    History of present illness 55 y.o. female presenting from [place] ED 7/3 with lethargy, hypotension and elevated lactic acid after d/c from [facility] earlier that day with septic shock and cellulitis. Patient found to have AMS, septic shock, acute respiratory failure, acute on chronic RV failure. Transferred to ICU on 7/12 due to hypotension. PMHX significant for obesity, DMII, COPD, CAD, lymphedema, ESRD on HD MWF and CHF.   OT comments  Patient met lying supine in bed. Reports 6/10 pain at rest and 8/10 with movement. Patient with request for tilting secondary to pain at L thigh after I&D 8/31. Please refer below for additional details. Patient continues to require +2 assist for all bed mobility and self-care tasks and would benefit from continued acute OT services in prep for safe d/c to next level of care. Patient in agreement transfer to recliner at time of next session. Current goals remain appropriate.    Follow Up Recommendations  SNF;LTACH;Supervision/Assistance - 24 hour    Equipment Recommendations  Hospital bed;3 in 1 bedside commode (Bariatric)    Recommendations for Other Services      Precautions / Restrictions Precautions Precautions: Fall Precaution Comments: bil LE edema, L thigh wound vac Restrictions Weight Bearing Restrictions: No       Mobility Bed Mobility Overal bed mobility: Needs Assistance             General bed mobility comments: Mod A +2 for positioning in supine in prep for tilting.    Transfers Overall transfer level: Needs assistance               General transfer comment: Tilted to 49 degrees for BUE/BLE HEP. Patient declined EOB transfer 2/2 pain.    Balance Overall balance assessment: Needs assistance         Standing balance support: Bilateral upper extremity supported Standing balance-Leahy Scale:  Zero Standing balance comment: Tilt feature used per patient request 2/2 pain after I&D 8/31.                           ADL either performed or assessed with clinical judgement   ADL                                               Vision       Perception     Praxis      Cognition Arousal/Alertness: Awake/alert Behavior During Therapy: WFL for tasks assessed/performed Overall Cognitive Status: Within Functional Limits for tasks assessed                                 General Comments: very motivated        Exercises Exercises: General Upper Extremity General Exercises - Upper Extremity Shoulder Flexion: Strengthening;Both;10 reps;Theraband;Standing Theraband Level (Shoulder Flexion): Other (comment) (Level 2 orange) Shoulder Horizontal ADduction: Strengthening;Both;10 reps;Theraband;Standing Theraband Level (Shoulder Horizontal Adduction): Other (comment) (Level 2 orange) Elbow Flexion: Strengthening;Both;10 reps;Standing;Theraband Theraband Level (Elbow Flexion): Other (comment) (Level 2 orange)   Shoulder Instructions       General Comments Patient tolerated tilting to 49 degrees for ~5-8 min for BLE HEP. Decreased tilt to 35 degrees for active rest break and  BUE HEP with level 2 orange theraband. Upon attempted return to 49 degrees, patient reporting dizziness and spots in vision. Attempted to obtain BP reading but unsuccessful. Symptoms resloved with tilt down to 35 degrees for duration of session.    Pertinent Vitals/ Pain       Pain Assessment: 0-10 Pain Score: 8  Pain Location: L thigh Pain Descriptors / Indicators: Burning;Aching Pain Intervention(s): Limited activity within patient's tolerance;Monitored during session;Premedicated before session;Repositioned  Home Living                                          Prior Functioning/Environment              Frequency  Min 2X/week         Progress Toward Goals  OT Goals(current goals can now be found in the care plan section)  Progress towards OT goals: Progressing toward goals  Acute Rehab OT Goals Patient Stated Goal: get stronger and return home; pivot to chair next time OT Goal Formulation: With patient ADL Goals Pt Will Transfer to Toilet: with max assist;with +2 assist;stand pivot transfer;bedside commode Pt Will Perform Toileting - Clothing Manipulation and hygiene: with mod assist;sitting/lateral leans;sit to/from stand;with adaptive equipment Pt/caregiver will Perform Home Exercise Program: Increased strength;Both right and left upper extremity;With theraband;With written HEP provided Additional ADL Goal #1: Pt will tolerate dynamic EOB activities (sitting and standing) x15 minutes with no more than min assist (in standing) with 0-1 hand support for progression to ADL performance.  Plan Discharge plan remains appropriate;Frequency remains appropriate    Co-evaluation    PT/OT/SLP Co-Evaluation/Treatment: Yes Reason for Co-Treatment: Complexity of the patient's impairments (multi-system involvement)   OT goals addressed during session: Strengthening/ROM      AM-PAC OT "6 Clicks" Daily Activity     Outcome Measure   Help from another person eating meals?: None Help from another person taking care of personal grooming?: A Little Help from another person toileting, which includes using toliet, bedpan, or urinal?: Total Help from another person bathing (including washing, rinsing, drying)?: A Lot Help from another person to put on and taking off regular upper body clothing?: A Little Help from another person to put on and taking off regular lower body clothing?: Total 6 Click Score: 14    End of Session Equipment Utilized During Treatment: Oxygen (2L upon entry. Titrated to RA.)  OT Visit Diagnosis: Other abnormalities of gait and mobility (R26.89);Muscle weakness (generalized) (M62.81);Pain Pain -  Right/Left: Left Pain - part of body: Leg   Activity Tolerance Patient tolerated treatment well;Patient limited by pain   Patient Left in bed;with call bell/phone within reach   Nurse Communication Mobility status;Patient requests pain meds        Time: 1026-1103 OT Time Calculation (min): 37 min  Charges: OT General Charges $OT Visit: 1 Visit OT Treatments $Therapeutic Exercise: 8-22 mins  Haneen Bernales H. OTR/L Supplemental OT, Department of rehab services [phone]   Valery Chance R H. 08/20/2021, 11:39 AM

## 2021-08-20 NOTE — Progress Notes (Signed)
Patient states she will place herself on CPAP when she is ready.  RT placed CPAP within reach of patient.  RT will continue to monitor.

## 2021-08-20 NOTE — Progress Notes (Signed)
PROGRESS NOTE  [name]  DOB: 12-Jun-1966  PCP: Noreene Larsson, NP JJY:[id number]  DOA: 06/21/2021  LOS: 14 days  Hospital Day: 71   Chief Complaint  Patient presents with   Hypotension    Brief narrative: [name] is a 55 y.o. female with PMH significant for ESRD on HD, morbid obesity, DM2, HTN, CHF, chronic lymphedema, COPD. Patient presented to the ED on 06/21/2021 with complaint of right hip and leg pain with evidence of cellulitis.   She was started on empiric antibiotics but continued to have issues with hypotension with evidence of cardiogenic shock from biventricular failure and pulmonary hypertension.  She was managed in the ICU with vasopressors and hemodialysis. See below for details.  Subjective: Patient was seen and examined this morning. Lying on bed.  Not in distress.  Complains of intermittent agonizing pain at the surgical site.  On IV Dilaudid. Has a wound VAC in place.  Assessment/Plan: Left medial thigh wound -Underwent I&D on 8/31 by plastic surgery.  Has a wound VAC in place.  Wound care per plastic surgery.  Cardiogenic shock -resolved Acute exacerbation of chronic right ventricular failure with pulmonary hypertension -patient required Vasopressor support in ICU.  Currently on midodrine.   -EF 55 to 60% on last echo from 7/15   ESRD-HD-MWF Chronic hypertension -Nephrology following.  Noted that inpatient dialysis have been challenging because of recurrent hypotension. Encouraged to try and prolong the duration of sitting in a recliner. -Continue midodrine.  Prolonged bleeding/oozing from AV fistula -8/25, patient underwent fistulogram by IR.  Did have tortuous AV fistula but no evidence of bleeding.  Continue apixaban.  If she starts to bleed, need to cut back on apixaban from 5 mg to 2.5 mg.     Infected RUE midline catheter -Midline removed and patient treated with linezolid for 5 days. Resolved.   Atypical atrial flutter -Patient  underwent DCCV on 7/15 with conversion to normal sinus rhythm. -Continue amiodarone and metoprolol.  Heart rate stable. Continue Eliquis for anticoagulation.    Possible OSA/OHS Pulmonary hypertension -Started on CPAP during hospitalization -Continue CPAP with recommendation for outpatient sleep study   Fibromyalgia -Continue Cymbalta   Anemia of chronic disease -Hemoglobin stable. Recent Labs    07/24/21 0838 07/27/21 0703 07/29/21 0040 07/31/21 0037 08/04/21 0043 08/06/21 1344 08/10/21 0752 08/12/21 0843 08/17/21 0721 08/19/21 0811  HGB 10.7* 10.4* 9.9* 10.5* 11.0* 11.3* 10.5* 10.7* 11.5* 11.5*     Diabetes mellitus, type 2 -Hemoglobin A1c of 6.9%. Not on any antidiabetic at this time. -Blood sugar level is better in last 24 hours.  Continue to monitor. Recent Labs  Lab 08/19/21 1734 08/19/21 1755 08/19/21 2047 08/20/21 0643 08/20/21 1118  GLUCAP 69* 91 138* 182* 160*     Morbid obesity  -Body mass index is 44.94 kg/m. Patient has been advised to make an attempt to improve diet and exercise patterns to aid in weight loss.   Weakness PT/OT recommended SNF.     Pressure injury Right/posterior/proximal thigh. Not present on admission.   Mobility: PT eval obtained Code Status:   Code Status: Full Code  Nutritional status: Body mass index is 44.94 kg/m. Nutrition Problem: Increased nutrient needs Etiology: wound healing Signs/Symptoms: estimated needs Diet:  Diet Order             Diet Carb Modified Fluid consistency: Thin; Room service appropriate? Yes  Diet effective now  DVT prophylaxis: apixaban (ELIQUIS) tablet 5 mg    Antimicrobials: None Fluid: None Consultants: Nephrology Family Communication: None at bedside  Status is: Inpatient  Remains inpatient appropriate because -pending SNF  Dispo: The patient is from: Home              Anticipated d/c is to: SNF              Patient currently is medically stable to  d/c.   Difficult to place patient No     Infusions:   sodium chloride     promethazine (PHENERGAN) injection (IM or IVPB) 12.5 mg (08/08/21 0901)    Scheduled Meds:  amiodarone  200 mg Oral Daily   apixaban  5 mg Oral BID   cinacalcet  30 mg Oral Q M,W,F-HD   [START ON 08/24/2021] darbepoetin (ARANESP) injection - DIALYSIS  25 mcg Intravenous Q Mon-HD   DULoxetine  20 mg Oral Daily   feeding supplement (NEPRO CARB STEADY)  237 mL Oral TID BM   HYDROcodone-acetaminophen  1 tablet Oral Q6H   insulin aspart  0-15 Units Subcutaneous TID WC   insulin aspart  0-5 Units Subcutaneous QHS   mouth rinse  15 mL Mouth Rinse BID   methocarbamol  750 mg Oral TID   metoprolol tartrate  12.5 mg Oral BID   midodrine  15 mg Oral TID WC   multivitamin  1 tablet Oral QHS   pantoprazole  40 mg Oral BID   polyethylene glycol  17 g Oral Daily   senna  1 tablet Oral Daily    Antimicrobials: Anti-infectives (From admission, onward)    Start     Dose/Rate Route Frequency Ordered Stop   08/19/21 1615  ceFAZolin 1 g / gentamicin 80 mg in NS 500 mL surgical irrigation  Status:  Discontinued          As needed 08/19/21 1619 08/19/21 1648   08/19/21 0600  ceFAZolin (ANCEF) IVPB 3g/100 mL premix        3 g 200 mL/hr over 30 Minutes Intravenous On call to O.R. 08/18/21 2051 08/19/21 1610   08/06/21 1530  piperacillin-tazobactam (ZOSYN) IVPB 2.25 g        2.25 g 100 mL/hr over 30 Minutes Intravenous Every 8 hours 08/06/21 1434 08/10/21 2347   07/16/21 2000  DAPTOmycin (CUBICIN) 500 mg in sodium chloride 0.9 % IVPB  Status:  Discontinued        6 mg/kg  87.4 kg (Adjusted) 120 mL/hr over 30 Minutes Intravenous Every 48 hours 07/15/21 1138 07/16/21 1043   07/16/21 1200  linezolid (ZYVOX) tablet 600 mg        600 mg Oral Every 12 hours 07/16/21 1101 07/21/21 2018   07/15/21 1000  ceFEPIme (MAXIPIME) 1 g in sodium chloride 0.9 % 100 mL IVPB  Status:  Discontinued        1 g 200 mL/hr over 30 Minutes  Intravenous Every 24 hours 07/15/21 0913 07/16/21 1043   07/15/21 0900  cefTRIAXone (ROCEPHIN) 1 g in sodium chloride 0.9 % 100 mL IVPB  Status:  Discontinued        1 g 200 mL/hr over 30 Minutes Intravenous Every 24 hours 07/15/21 0752 07/15/21 0913   07/15/21 0500  DAPTOmycin (CUBICIN) 500 mg in sodium chloride 0.9 % IVPB  Status:  Discontinued        6 mg/kg  87.4 kg (Adjusted) 120 mL/hr over 30 Minutes Intravenous Every 48 hours 07/15/21 0404 07/15/21 1138  06/23/21 0930  DAPTOmycin (CUBICIN) 500 mg in sodium chloride 0.9 % IVPB        6 mg/kg  86.2 kg (Adjusted) 120 mL/hr over 30 Minutes Intravenous Every 48 hours 06/23/21 0831 06/28/21 0130   06/23/21 0915  DAPTOmycin (CUBICIN) 800 mg in sodium chloride 0.9 % IVPB  Status:  Discontinued        6 mg/kg  130.1 kg 132 mL/hr over 30 Minutes Intravenous Every 48 hours 06/23/21 0825 06/23/21 0831   06/22/21 2200  ceFEPIme (MAXIPIME) 1 g in sodium chloride 0.9 % 100 mL IVPB  Status:  Discontinued        1 g 200 mL/hr over 30 Minutes Intravenous Every 24 hours 06/22/21 0001 06/23/21 0825   06/22/21 0800  metroNIDAZOLE (FLAGYL) IVPB 500 mg  Status:  Discontinued        500 mg 100 mL/hr over 60 Minutes Intravenous Every 8 hours 06/22/21 0716 06/23/21 0825   06/21/21 2230  doxycycline (VIBRAMYCIN) 100 mg in sodium chloride 0.9 % 250 mL IVPB  Status:  Discontinued        100 mg 125 mL/hr over 120 Minutes Intravenous Every 12 hours 06/21/21 2223 06/23/21 0825   06/21/21 2215  ceFEPIme (MAXIPIME) 2 g in sodium chloride 0.9 % 100 mL IVPB        2 g 200 mL/hr over 30 Minutes Intravenous  Once 06/21/21 2208 06/21/21 2245   06/21/21 2215  metroNIDAZOLE (FLAGYL) IVPB 500 mg        500 mg 100 mL/hr over 60 Minutes Intravenous  Once 06/21/21 2208 06/21/21 2340       PRN meds: acetaminophen, dextromethorphan-guaiFENesin, diphenhydrAMINE, docusate sodium, HYDROmorphone (DILAUDID) injection, ipratropium-albuterol, ondansetron (ZOFRAN) IV,  polyethylene glycol, promethazine (PHENERGAN) injection (IM or IVPB)   Objective: Vitals:   08/20/21 0903 08/20/21 1140  BP: 110/66 99/64  Pulse: 63 68  Resp: 16 16  Temp:  (!) 97.3 F (36.3 C)  SpO2:  100%    Intake/Output Summary (Last 24 hours) at 08/20/2021 1310 Last data filed at 08/20/2021 1130 Gross per 24 hour  Intake 725 ml  Output 10 ml  Net 715 ml    Filed Weights   08/19/21 0730 08/19/21 1042 08/20/21 0500  Weight: 124.8 kg 120.3 kg 122.5 kg   Weight change: -1.5 kg Body mass index is 44.94 kg/m.   Physical Exam: General exam: Pleasant, middle-aged African-American female.  Morbidly obese  skin: No rashes, lesions or ulcers. HEENT: Atraumatic, normocephalic, no obvious bleeding Lungs: Clear to auscultation bilaterally CVS: Regular rate and rhythm, no murmur GI/Abd soft, distended, nontender, bowel sound present CNS: Alert, awake and oriented x3 Psychiatry: Mood appropriate Extremities: Chronic lymphedema.  Left inner thigh with wound VAC in place.  Data Review: I have personally reviewed the laboratory data and studies available.  Recent Labs  Lab 08/17/21 0721 08/19/21 0811  WBC 9.8 9.7  HGB 11.5* 11.5*  HCT 34.4* 34.8*  MCV 89.8 91.1  PLT 210 228    Recent Labs  Lab 08/17/21 0721 08/19/21 0811  NA 130* 133*  K 4.4 3.5  CL 93* 95*  CO2 21* 24  GLUCOSE 120* 128*  BUN 76* 46*  CREATININE 7.41* 5.20*  CALCIUM 8.1* 8.1*  PHOS 4.8* 3.3     F/u labs ordered Unresulted Labs (From admission, onward)     Start     Ordered   08/23/21 0500  CBC  Once,   R       Question:  Specimen  collection method  Answer:  Lab=Lab collect   08/18/21 1523   Signed and Held  Renal function panel  Once,   R       Question:  Specimen collection method  Answer:  Lab=Lab collect   Signed and Held   Signed and Held  CBC  Once,   R       Question:  Specimen collection method  Answer:  Lab=Lab collect   Signed and Held   Signed and Held  Renal function panel   Once,   R       Question:  Specimen collection method  Answer:  Lab=Lab collect   Signed and Held   Signed and Held  CBC  Once,   R       Question:  Specimen collection method  Answer:  Lab=Lab collect   Signed and Held            Signed, Terrilee Croak, MD [facility] 08/20/2021

## 2021-08-20 NOTE — Progress Notes (Signed)
Patient ID: [name], female   DOB: Jan 27, 1966, 55 y.o.   MRN: [id number] S: Feels well, tolerated surgery without issues. O:BP 99/64 (BP Location: Right Arm)   Pulse 68   Temp (!) 97.3 F (36.3 C) (Oral)   Resp 16   Ht _0  (1.651 m)   Wt 122.5 kg   SpO2 100%   BMI 44.94 kg/m   Intake/Output Summary (Last 24 hours) at 08/20/2021 1221 Last data filed at 08/19/2021 1800 Gross per 24 hour  Intake 600 ml  Output 10 ml  Net 590 ml   Intake/Output: I/O last 3 completed shifts: In: 840 [P.O.:340; I.V.:400; IV Piggyback:100] Out: 9379 [Other:4005; Blood:10]  Intake/Output this shift:  No intake/output data recorded. Weight change: -1.5 kg Gen:NAD CVS: RRR Resp: CTA Abd: +BS, soft, NT/Nd Ext: 1+ edema, wound vac on left thigh.  Recent Labs  Lab 08/17/21 0721 08/19/21 0811  NA 130* 133*  K 4.4 3.5  CL 93* 95*  CO2 21* 24  GLUCOSE 120* 128*  BUN 76* 46*  CREATININE 7.41* 5.20*  ALBUMIN 3.6 3.7  CALCIUM 8.1* 8.1*  PHOS 4.8* 3.3   Liver Function Tests: Recent Labs  Lab 08/17/21 0721 08/19/21 0811  ALBUMIN 3.6 3.7   No results for input(s): LIPASE, AMYLASE in the last 168 hours. No results for input(s): AMMONIA in the last 168 hours. CBC: Recent Labs  Lab 08/17/21 0721 08/19/21 0811  WBC 9.8 9.7  HGB 11.5* 11.5*  HCT 34.4* 34.8*  MCV 89.8 91.1  PLT 210 228   Cardiac Enzymes: No results for input(s): CKTOTAL, CKMB, CKMBINDEX, TROPONINI in the last 168 hours. CBG: Recent Labs  Lab 08/19/21 1734 08/19/21 1755 08/19/21 2047 08/20/21 0643 08/20/21 1118  GLUCAP 69* 91 138* 182* 160*    Iron Studies: No results for input(s): IRON, TIBC, TRANSFERRIN, FERRITIN in the last 72 hours. Studies/Results: No results found.  amiodarone  200 mg Oral Daily   apixaban  5 mg Oral BID   cinacalcet  30 mg Oral Q M,W,F-HD   [START ON 08/24/2021] darbepoetin (ARANESP) injection - DIALYSIS  25 mcg Intravenous Q Mon-HD   DULoxetine  20 mg Oral Daily   feeding  supplement (NEPRO CARB STEADY)  237 mL Oral TID BM   HYDROcodone-acetaminophen  1 tablet Oral Q6H   insulin aspart  0-15 Units Subcutaneous TID WC   insulin aspart  0-5 Units Subcutaneous QHS   mouth rinse  15 mL Mouth Rinse BID   methocarbamol  750 mg Oral TID   metoprolol tartrate  12.5 mg Oral BID   midodrine  15 mg Oral TID WC   multivitamin  1 tablet Oral QHS   pantoprazole  40 mg Oral BID   polyethylene glycol  17 g Oral Daily   senna  1 tablet Oral Daily    BMET    Component Value Date/Time   NA 133 (L) 08/19/2021 0811   K 3.5 08/19/2021 0811   CL 95 (L) 08/19/2021 0811   CO2 24 08/19/2021 0811   GLUCOSE 128 (H) 08/19/2021 0811   BUN 46 (H) 08/19/2021 0811   CREATININE 5.20 (H) 08/19/2021 0811   CALCIUM 8.1 (L) 08/19/2021 0811   CALCIUM 8.5 09/06/2012 1056   GFRNONAA 9 (L) 08/19/2021 0811   GFRAA 5 (L) 07/04/2020 0732   GFRAA 5 (L) 07/04/2020 0732   CBC    Component Value Date/Time   WBC 9.7 08/19/2021 0811   RBC 3.82 (L) 08/19/2021 0811   HGB  11.5 (L) 08/19/2021 0811   HCT 34.8 (L) 08/19/2021 0811   PLT 228 08/19/2021 0811   MCV 91.1 08/19/2021 0811   MCH 30.1 08/19/2021 0811   MCHC 33.0 08/19/2021 0811   RDW 18.6 (H) 08/19/2021 0811   LYMPHSABS 1.3 08/12/2021 0843   MONOABS 0.5 08/12/2021 0843   EOSABS 0.1 08/12/2021 0843   BASOSABS 0.0 08/12/2021 0843    OUTPATIENT HD:  [facility] [place], MWF, edw 135.5kg, 1K/2.5Ca, BFR 350, DFR 500, LAVF access, epo 1200 units every HD  Assessment/Plan:   Cardiogenic shock - due to acute on chronic RVF and pulmonary HTN.  improved ESRD - continue with HD on MWF schedule.  Left thigh wound - seen by plastic surgery and s/p surgical debridement and wound vac placement 08/19/21. Anemia of ESRD - stable Atypical atrial flutter - s/p DCCV on 07/03/21 to NSR, on amiodarone, metoprolol, and on eliquis. CKD-MBD - continue with home meds Hypotension - tolerating HD with UF on midodrine.  Disposition - hopeful discharge after  I&D later this week.    Donetta Potts, MD [employer] [phone]

## 2021-08-20 NOTE — TOC Progression Note (Addendum)
Transition of Care [facility]) - Progression Note    Patient Details  Name: MARJANI KOBEL MRN: [id number] Date of Birth: 20-Jun-1966  Transition of Care [facility]) CM/SW Contact  Sharlet Salina Mila Homer, LCSW Phone Number: 08/20/2021, 2:14 PM  Clinical Narrative:  CSW advised that patient ready for discharge today. Calls made to [place], admissions Mudlogger at [facility] and messages left [phone] & [phone]). CSW will continue trying to reach admissions director.  9:70 pm: [facility] and message left for Debbie, admissions Mudlogger. Other 2 numbers called and message left on [phone] number.    Expected Discharge Plan: Skilled Nursing Facility Barriers to Discharge: Continued Medical Work up, Ship broker  Expected Discharge Plan and Services Expected Discharge Plan: [place] In-house Referral: Clinical Social Work   Post Acute Care Choice: [place] Living arrangements for the past 2 months: Single Family Home                                       Social Determinants of Health (SDOH) Interventions Food Insecurity Interventions: Intervention Not Indicated Financial Strain Interventions: Intervention Not Indicated Housing Interventions: Intervention Not Indicated Transportation Interventions: Intervention Not Indicated  Readmission Risk Interventions Readmission Risk Prevention Plan 04/30/2019  Transportation Screening Complete  Medication Review Press photographer) Complete  PCP or Specialist appointment within 3-5 days of discharge Complete  HRI or [place] Complete  SW Recovery Care/Counseling Consult Complete  [place] Not Applicable  Some recent data might be hidden

## 2021-08-20 NOTE — Plan of Care (Signed)
  Problem: Pain Managment: Goal: General experience of comfort will improve Outcome: Progressing   Problem: Skin Integrity: Goal: Risk for impaired skin integrity will decrease Outcome: Progressing   Problem: Fluid Volume: Goal: Compliance with measures to maintain balanced fluid volume will improve Outcome: Not Progressing   Problem: Fluid Volume: Goal: Compliance with measures to maintain balanced fluid volume will improve Outcome: Not Progressing

## 2021-08-20 NOTE — Progress Notes (Signed)
1 Day Post-Op  Subjective:  Patient is resting comfortably in bed.  She states that her left thigh pain is considerably improved compared to how she felt prior to the debridement.  She denies any fevers or chills.  She states that she is uncertain as to her disposition and that she has not seen her case manager recently.  Patient also reports that the wound VAC initially was not draining, but now seems to be working.  She denies any drainage from around her incision site.  Objective: Vital signs in last 24 hours: Temp:  [97.3 F (36.3 C)-97.6 F (36.4 C)] 97.3 F (36.3 C) (09/01 1140) Pulse Rate:  [57-68] 64 (09/01 1610) Resp:  [16-20] 18 (09/01 1610) BP: (90-141)/(54-78) 100/55 (09/01 1610) SpO2:  [99 %-100 %] 100 % (09/01 1610) Weight:  [122.5 kg] 122.5 kg (09/01 0500) Last BM Date: 08/17/21  Intake/Output from previous day: 08/31 0701 - 09/01 0700 In: 600 [P.O.:100; I.V.:400; IV Piggyback:100] Out: 1062 [Blood:10] Intake/Output this shift: Total I/O In: 125 [P.O.:125] Out: -   General appearance: alert, cooperative, and not ill-appearing. Resp: Unlabored. Incision/Wound: Wound VAC in place.  Good seal noted.  Dark serosanguineous drainage in canister.  No surrounding erythema or induration otherwise concerning for infection.  Lab Results:  CBC Latest Ref Rng & Units 08/19/2021 08/17/2021 08/12/2021  WBC 4.0 - 10.5 K/uL 9.7 9.8 6.4  Hemoglobin 12.0 - 15.0 g/dL 11.5(L) 11.5(L) 10.7(L)  Hematocrit 36.0 - 46.0 % 34.8(L) 34.4(L) 33.5(L)  Platelets 150 - 400 K/uL 228 210 218    BMET Recent Labs    08/19/21 0811  NA 133*  K 3.5  CL 95*  CO2 24  GLUCOSE 128*  BUN 46*  CREATININE 5.20*  CALCIUM 8.1*   PT/INR No results for input(s): LABPROT, INR in the last 72 hours. ABG No results for input(s): PHART, HCO3 in the last 72 hours.  Invalid input(s): PCO2, PO2  Studies/Results: No results found.  Anti-infectives: Anti-infectives (From admission, onward)     Start     Dose/Rate Route Frequency Ordered Stop   08/19/21 1615  ceFAZolin 1 g / gentamicin 80 mg in NS 500 mL surgical irrigation  Status:  Discontinued          As needed 08/19/21 1619 08/19/21 1648   08/19/21 0600  ceFAZolin (ANCEF) IVPB 3g/100 mL premix        3 g 200 mL/hr over 30 Minutes Intravenous On call to O.R. 08/18/21 2051 08/19/21 1610   08/06/21 1530  piperacillin-tazobactam (ZOSYN) IVPB 2.25 g        2.25 g 100 mL/hr over 30 Minutes Intravenous Every 8 hours 08/06/21 1434 08/10/21 2347   07/16/21 2000  DAPTOmycin (CUBICIN) 500 mg in sodium chloride 0.9 % IVPB  Status:  Discontinued        6 mg/kg  87.4 kg (Adjusted) 120 mL/hr over 30 Minutes Intravenous Every 48 hours 07/15/21 1138 07/16/21 1043   07/16/21 1200  linezolid (ZYVOX) tablet 600 mg        600 mg Oral Every 12 hours 07/16/21 1101 07/21/21 2018   07/15/21 1000  ceFEPIme (MAXIPIME) 1 g in sodium chloride 0.9 % 100 mL IVPB  Status:  Discontinued        1 g 200 mL/hr over 30 Minutes Intravenous Every 24 hours 07/15/21 0913 07/16/21 1043   07/15/21 0900  cefTRIAXone (ROCEPHIN) 1 g in sodium chloride 0.9 % 100 mL IVPB  Status:  Discontinued  1 g 200 mL/hr over 30 Minutes Intravenous Every 24 hours 07/15/21 0752 07/15/21 0913   07/15/21 0500  DAPTOmycin (CUBICIN) 500 mg in sodium chloride 0.9 % IVPB  Status:  Discontinued        6 mg/kg  87.4 kg (Adjusted) 120 mL/hr over 30 Minutes Intravenous Every 48 hours 07/15/21 0404 07/15/21 1138   06/23/21 0930  DAPTOmycin (CUBICIN) 500 mg in sodium chloride 0.9 % IVPB        6 mg/kg  86.2 kg (Adjusted) 120 mL/hr over 30 Minutes Intravenous Every 48 hours 06/23/21 0831 06/28/21 0130   06/23/21 0915  DAPTOmycin (CUBICIN) 800 mg in sodium chloride 0.9 % IVPB  Status:  Discontinued        6 mg/kg  130.1 kg 132 mL/hr over 30 Minutes Intravenous Every 48 hours 06/23/21 0825 06/23/21 0831   06/22/21 2200  ceFEPIme (MAXIPIME) 1 g in sodium chloride 0.9 % 100 mL IVPB  Status:   Discontinued        1 g 200 mL/hr over 30 Minutes Intravenous Every 24 hours 06/22/21 0001 06/23/21 0825   06/22/21 0800  metroNIDAZOLE (FLAGYL) IVPB 500 mg  Status:  Discontinued        500 mg 100 mL/hr over 60 Minutes Intravenous Every 8 hours 06/22/21 0716 06/23/21 0825   06/21/21 2230  doxycycline (VIBRAMYCIN) 100 mg in sodium chloride 0.9 % 250 mL IVPB  Status:  Discontinued        100 mg 125 mL/hr over 120 Minutes Intravenous Every 12 hours 06/21/21 2223 06/23/21 0825   06/21/21 2215  ceFEPIme (MAXIPIME) 2 g in sodium chloride 0.9 % 100 mL IVPB        2 g 200 mL/hr over 30 Minutes Intravenous  Once 06/21/21 2208 06/21/21 2245   06/21/21 2215  metroNIDAZOLE (FLAGYL) IVPB 500 mg        500 mg 100 mL/hr over 60 Minutes Intravenous  Once 06/21/21 2208 06/21/21 2340       Assessment/Plan: s/p Procedure(s): Debridement of left thigh wound APPLICATION OF SKIN SUBSTITUTE APPLICATION OF WOUND VAC   Keep wound VAC in place for 1 week.  Continue to monitor volume output.  Disposition per primary team.     LOS: 60 days    [name], PA-C 08/20/2021

## 2021-08-20 NOTE — Progress Notes (Signed)
Called ortho tech to request unna boot for RLE

## 2021-08-20 NOTE — Progress Notes (Signed)
Orthopedic Tech Progress Note Patient Details:  [name] [birth year] [id number]  Ortho Devices Type of Ortho Device: Louretta Parma boot Ortho Device/Splint Location: RLE Ortho Device/Splint Interventions: Application   Post Interventions Patient Tolerated: Well Instructions Provided: Care of device  [name] 08/20/2021, 6:03 PM

## 2021-08-20 NOTE — Anesthesia Postprocedure Evaluation (Signed)
Anesthesia Post Note  Patient: KAEDANCE MAGOS  Procedure(s) Performed: Debridement of left thigh wound (Left: Thigh) APPLICATION OF SKIN SUBSTITUTE (Left: Thigh) APPLICATION OF WOUND VAC (Left: Thigh)     Patient location during evaluation: PACU Anesthesia Type: General Level of consciousness: awake and alert Pain management: pain level controlled Vital Signs Assessment: post-procedure vital signs reviewed and stable Respiratory status: spontaneous breathing, nonlabored ventilation, respiratory function stable and patient connected to nasal cannula oxygen Cardiovascular status: blood pressure returned to baseline and stable Postop Assessment: no apparent nausea or vomiting Anesthetic complications: no   No notable events documented.  Last Vitals:  Vitals:   08/20/21 1610 08/20/21 2108  BP: (!) 100/55 110/69  Pulse: 64 66  Resp: 18 18  Temp:  36.4 C  SpO2: 100% 100%    Last Pain:  Vitals:   08/20/21 2108  TempSrc: Oral  PainSc:                  March Rummage Elizeth Weinrich

## 2021-08-21 LAB — RENAL FUNCTION PANEL
Albumin: 3.4 g/dL — ABNORMAL LOW (ref 3.5–5.0)
Anion gap: 19 — ABNORMAL HIGH (ref 5–15)
BUN: 55 mg/dL — ABNORMAL HIGH (ref 6–20)
CO2: 18 mmol/L — ABNORMAL LOW (ref 22–32)
Calcium: 7.2 mg/dL — ABNORMAL LOW (ref 8.9–10.3)
Chloride: 93 mmol/L — ABNORMAL LOW (ref 98–111)
Creatinine, Ser: 6.46 mg/dL — ABNORMAL HIGH (ref 0.44–1.00)
GFR, Estimated: 7 mL/min — ABNORMAL LOW (ref >60)
Glucose, Bld: 146 mg/dL — ABNORMAL HIGH (ref 70–99)
Phosphorus: 4.5 mg/dL (ref 2.5–4.6)
Potassium: 4.1 mmol/L (ref 3.5–5.1)
Sodium: 130 mmol/L — ABNORMAL LOW (ref 135–145)

## 2021-08-21 LAB — CBC
HCT: 35.8 % — ABNORMAL LOW (ref 36.0–46.0)
Hemoglobin: 11.8 g/dL — ABNORMAL LOW (ref 12.0–15.0)
MCH: 30.1 pg (ref 26.0–34.0)
MCHC: 33 g/dL (ref 30.0–36.0)
MCV: 91.3 fL (ref 80.0–100.0)
Platelets: 212 10*3/uL (ref 150–400)
RBC: 3.92 MIL/uL (ref 3.87–5.11)
RDW: 18.9 % — ABNORMAL HIGH (ref 11.5–15.5)
WBC: 10.5 10*3/uL (ref 4.0–10.5)
nRBC: 0 % (ref 0.0–0.2)

## 2021-08-21 LAB — GLUCOSE, CAPILLARY
Glucose-Capillary: 153 mg/dL — ABNORMAL HIGH (ref 70–99)
Glucose-Capillary: 144 mg/dL — ABNORMAL HIGH (ref 70–99)
Glucose-Capillary: 144 mg/dL — ABNORMAL HIGH (ref 70–99)

## 2021-08-21 MED ORDER — METHOCARBAMOL 750 MG PO TABS: 750.0000 mg | ORAL_TABLET | Freq: Three times a day (TID) | ORAL | 0 refills | Status: AC

## 2021-08-21 MED ORDER — AMIODARONE HCL 200 MG PO TABS: 200.0000 mg | ORAL_TABLET | Freq: Every day | ORAL | 0 refills | Status: DC

## 2021-08-21 MED ORDER — METOPROLOL TARTRATE 25 MG PO TABS: 12.5000 mg | ORAL_TABLET | Freq: Two times a day (BID) | ORAL | 0 refills | Status: DC

## 2021-08-21 MED ORDER — PANTOPRAZOLE SODIUM 40 MG PO TBEC
40.0000 mg | DELAYED_RELEASE_TABLET | Freq: Two times a day (BID) | ORAL | 0 refills | Status: DC
Start: 2021-08-21 — End: 2021-10-04

## 2021-08-21 MED ORDER — HYDROCODONE-ACETAMINOPHEN 10-325 MG PO TABS: 1.0000 | ORAL_TABLET | Freq: Four times a day (QID) | ORAL | 0 refills | Status: DC

## 2021-08-21 MED ORDER — NEPRO/CARBSTEADY PO LIQD
237.0000 mL | ORAL | Status: DC
  Administered 2021-08-22: 237 mL via ORAL

## 2021-08-21 MED ORDER — APIXABAN 5 MG PO TABS
5.0000 mg | ORAL_TABLET | Freq: Two times a day (BID) | ORAL | 0 refills | Status: DC
Start: 2021-08-21 — End: 2021-10-04

## 2021-08-21 MED ORDER — MIDODRINE HCL 5 MG PO TABS: 15.0000 mg | ORAL_TABLET | Freq: Three times a day (TID) | ORAL | 0 refills | Status: DC

## 2021-08-21 NOTE — Progress Notes (Signed)
Patient is a 55 year old female status postdebridement of her left thigh wound with Dr. Marla Roe on 08/19/2021.  She had placement of wound matrix and a wound VAC.  She is scheduled to be discharged today or tomorrow, she has been approved for SNF and this has been recommended but patient is declining. I did discuss on the phone today with primary team that our recommendation is strongly in favor of SNF as well.  We did order a home wound VAC today, appreciate assistance from transition of care team.  We can see patient next week in the office for wound VAC change, however she is going to need home health for assistance with ongoing wound care for the next few months.  Appreciate assistance from primary team, please let us know if we can provide any further assistance.  We will continue to follow.

## 2021-08-21 NOTE — Discharge Summary (Signed)
Physician Discharge Summary  [name] ZYY:[id number] DOB: 1966/03/03 DOA: 06/21/2021  PCP: Noreene Larsson, NP  Admit date: 06/21/2021 Discharge date: 08/21/2021  Admitted From: Home Discharge disposition: Home with home with services and DME   Code Status: Full Code   Discharge Diagnosis:   Principal Problem:   Hypotension Active Problems:   Lymphedema of lower extremity   Morbid obesity ([place])   Obesity hypoventilation syndrome ([place])   Cellulitis   ESRD on dialysis ([place])   Staphylococcus epidermidis bacteremia   Elevated troponin   Chronic right heart failure (HCC)   Type 2 diabetes mellitus with hyperglycemia ([place])   Lymphedema   Cardiogenic shock ([place])   Bilateral calf pain   Pressure injury of skin   Pulmonary hypertension ([place])    Chief Complaint  Patient presents with   Hypotension    Brief narrative: [name] is a 55 y.o. female with PMH significant for ESRD on HD, morbid obesity, DM2, HTN, CHF, chronic lymphedema, COPD. Patient presented to the ED on 06/21/2021 with complaint of right hip and leg pain with evidence of cellulitis.   She was started on empiric antibiotics but continued to have issues with hypotension with evidence of cardiogenic shock from biventricular failure and pulmonary hypertension.  She was managed in the ICU with vasopressors and hemodialysis. She has had prolonged hospitalization. For the last 1 week, she waited for IND to be done by plastic surgeon on 8/31. I&D was done as planned.  Currently has wound VAC.  Patient is recommended for SNF but she wants to go home with services.  Subjective: Patient was seen and examined this morning. Lying on bed.  Not in distress.   Pain controlled.  Wound VAC in place.  Patient states that she does not want SNF and wants to go home with services.  She states he has enough support at home.  Assessment/Plan: Left medial thigh wound -Underwent I&D on 8/31 by plastic surgery.  Has a wound  VAC in place.  Wound care per plastic surgery. -Ordered for home health RN for wound [facility] management.  However almost, is not able to provide iron service.  I discussed the case with plastic surgery team.  Patient will go home with home wound VAC.  She will follow-up with plastic surgery team on 08/25/2021 at 10 AM.  Cardiogenic shock -resolved Acute exacerbation of chronic right ventricular failure with pulmonary hypertension -patient required Vasopressor support in ICU.  Clinically improved.  Currently on midodrine.  Continue the same at discharge -EF 55 to 60% on last echo from 7/15   ESRD-HD-MWF Chronic hypertension -Nephrology following.  Continue midodrine.  Prolonged bleeding/oozing from AV fistula -8/25, patient underwent fistulogram by IR.  Did have tortuous AV fistula but no evidence of bleeding.  Continue apixaban.  If she starts to bleed, need to cut back on apixaban from 5 mg to 2.5 mg.     Infected RUE midline catheter -Midline removed and patient treated with linezolid for 5 days. Resolved.   Atypical atrial flutter -Patient underwent DCCV on 7/15 with conversion to normal sinus rhythm. -Continue amiodarone and metoprolol.  Heart rate stable. Continue Eliquis for anticoagulation.    Possible OSA/OHS Pulmonary hypertension -Started on CPAP during hospitalization -Continue CPAP with recommendation for outpatient sleep study   Fibromyalgia -Continue Cymbalta   Anemia of chronic disease -Hemoglobin stable. Recent Labs    07/27/21 0703 07/29/21 0040 07/31/21 0037 08/04/21 0043 08/06/21 1344 08/10/21 4888 08/12/21 9169 08/17/21 0721 08/19/21  5462 08/21/21 0634  HGB 10.4* 9.9* 10.5* 11.0* 11.3* 10.5* 10.7* 11.5* 11.5* 11.8*    Diabetes mellitus, type 2 -Hemoglobin A1c of 6.9%. Not on any antidiabetic at this time. -Blood sugar level controlled. Recent Labs  Lab 08/20/21 1118 08/20/21 1547 08/20/21 2106 08/21/21 0646 08/21/21 1129  GLUCAP 160* 197* 133*  153* 144*    Morbid obesity  -Body mass index is 45.27 kg/m. Patient has been advised to make an attempt to improve diet and exercise patterns to aid in weight loss.   Weakness PT/OT recommended SNF.  Patient wants to go home with services.   Allergies as of 08/21/2021       Reactions   Contrast Media [iodinated Diagnostic Agents] Anaphylaxis, Hives, Swelling, Other (See Comments)   Dye for cardiac cath. Tongue swells   Pneumococcal Vaccines Swelling, Other (See Comments)   Turns skin black, and bodily swelling   Vancomycin Nausea And Vomiting, Other (See Comments)   Infusion "made me feel like I was dying" had to be readmitted to hospital        Medication List     STOP taking these medications    ibuprofen 200 MG tablet Commonly known as: ADVIL       TAKE these medications    acetaminophen 325 MG tablet Commonly known as: TYLENOL Take 2 tablets (650 mg total) by mouth every 4 (four) hours as needed for mild pain (or Fever >/= 101).   amiodarone 200 MG tablet Commonly known as: PACERONE Take 1 tablet (200 mg total) by mouth daily.   apixaban 5 MG Tabs tablet Commonly known as: ELIQUIS Take 1 tablet (5 mg total) by mouth 2 (two) times daily.   calcium acetate 667 MG capsule Commonly known as: PHOSLO Take 1,334 mg by mouth 3 (three) times daily.   calcium carbonate 500 MG chewable tablet Commonly known as: TUMS - dosed in mg elemental calcium Chew 2 tablets by mouth 2 (two) times daily.   EX-LAX PO Take 2 tablets by mouth daily as needed (constipation).   HYDROcodone-acetaminophen 10-325 MG tablet Commonly known as: NORCO Take 1 tablet by mouth every 6 (six) hours for 5 days.   lanthanum 500 MG chewable tablet Commonly known as: FOSRENOL Chew 1,000-1,500 mg by mouth 5 (five) times daily. 1500 mg by mouth three times a day with meals and 1000 mg by mouth twice a day with snacks   loperamide 2 MG capsule Commonly known as: IMODIUM Take 2-4 mg by mouth  as needed for diarrhea or loose stools.   methocarbamol 750 MG tablet Commonly known as: ROBAXIN Take 1 tablet (750 mg total) by mouth 3 (three) times daily for 7 days.   metoprolol tartrate 25 MG tablet Commonly known as: LOPRESSOR Take 0.5 tablets (12.5 mg total) by mouth 2 (two) times daily.   midodrine 5 MG tablet Commonly known as: PROAMATINE Take 3 tablets (15 mg total) by mouth 3 (three) times daily with meals.   multivitamin Tabs tablet Take 1 tablet by mouth daily.   Oxycodone HCl 10 MG Tabs Take 1 tablet (10 mg total) by mouth 4 (four) times daily as needed (pain).   pantoprazole 40 MG tablet Commonly known as: PROTONIX Take 1 tablet (40 mg total) by mouth 2 (two) times daily.               Durable Medical Equipment  (From admission, onward)           Start     Ordered  08/21/21 1154  For home use only DME Bedside commode  Once       Comments: Bariatric  Question:  Patient needs a bedside commode to treat with the following condition  Answer:  Impaired mobility   08/21/21 1153   08/21/21 1154  For home use only DME Walker rolling  Once       Comments: Bariatric  Question Answer Comment  Walker: With [place]   Patient needs a walker to treat with the following condition Impaired mobility      08/21/21 1153   08/21/21 1139  For home use only DME Hospital bed  Once       Question Answer Comment  Length of Need Lifetime   Bed type Semi-electric   Hoyer Lift Yes   Trapeze Bar Yes   Support Surface: Gel Overlay      08/21/21 1138              Discharge Care Instructions  (From admission, onward)           Start     Ordered   08/21/21 0000  Discharge wound care:       Comments: Use wound vac.  F/u with plastic surgery on 08/25/2021 at 10 am.   08/21/21 1432            Discharge Instructions:  Diet Recommendation:  Discharge Diet Orders (From admission, onward)     Start     Ordered   08/21/21 0000  Diet - low sodium  heart healthy       Comments: Renal diet   08/21/21 1432              Follow with Primary MD Noreene Larsson, NP in 7 days   Get CBC/BMP checked in next visit within 1 week by PCP or SNF MD ( we routinely change or add medications that can affect your baseline labs and fluid status, therefore we recommend that you get the mentioned basic workup next visit with your PCP, your PCP may decide not to get them or add new tests based on their clinical decision)  On your next visit with your PCP, please Get Medicines reviewed and adjusted.  Please request your PCP  to go over all Hospital Tests and Procedure/Radiological results at the follow up, please get all Hospital records sent to your Prim MD by signing hospital release before you go home.  Activity: As tolerated with Full fall precautions use walker/cane & assistance as needed  For Heart failure patients - Check your Weight same time everyday, if you gain over 2 pounds, or you develop in leg swelling, experience more shortness of breath or chest pain, call your Primary MD immediately. Follow Cardiac Low Salt Diet and 1.5 lit/day fluid restriction.  If you have smoked or chewed Tobacco in the last 2 yrs please stop smoking, stop any regular Alcohol  and or any Recreational drug use.  If you experience worsening of your admission symptoms, develop shortness of breath, life threatening emergency, suicidal or homicidal thoughts you must seek medical attention immediately by calling 911 or calling your MD immediately  if symptoms less severe.  You Must read complete instructions/literature along with all the possible adverse reactions/side effects for all the Medicines you take and that have been prescribed to you. Take any new Medicines after you have completely understood and accpet all the possible adverse reactions/side effects.   Do not drive, operate heavy machinery, perform activities at heights,  swimming or participation in water  activities or provide baby sitting services if your were admitted for syncope or siezures until you have seen by Primary MD or a Neurologist and advised to do so again.  Do not drive when taking Pain medications.  Do not take more than prescribed Pain, Sleep and Anxiety Medications  Wear Seat belts while driving.   Please note You were cared for by a hospitalist during your hospital stay. If you have any questions about your discharge medications or the care you received while you were in the hospital after you are discharged, you can call the unit and asked to speak with the hospitalist on call if the hospitalist that took care of you is not available. Once you are discharged, your primary care physician will handle any further medical issues. Please note that NO REFILLS for any discharge medications will be authorized once you are discharged, as it is imperative that you return to your primary care physician (or establish a relationship with a primary care physician if you do not have one) for your aftercare needs so that they can reassess your need for medications and monitor your lab values.    Follow ups:    Follow-up Information     Noreene Larsson, NP Follow up.   Specialty: Nurse Practitioner Contact information: [address] [place] [address] Ya[place], PA-C Follow up.   Specialty: Emergency Medicine Why: 08/25/2021 at 10 am Contact information: [place] [address] [phone]                 Wound care:   Pressure Injury 07/04/21 Thigh Posterior;Proximal;Right Stage 2 -  Partial thickness loss of dermis presenting as a shallow open injury with a red, pink wound bed without slough. (Active)  Date First Assessed/Time First Assessed: 07/04/21 1200   Location: (c) Thigh  Location Orientation: Posterior;Proximal;Right  Staging: Stage 2 -  Partial thickness loss of dermis presenting as  a shallow open injury with a red, pink wound bed without s...    Assessments 07/04/2021 12:00 PM 08/16/2021  8:03 AM  Dressing Type Foam - Lift dressing to assess site every shift Foam - Lift dressing to assess site every shift  Dressing Clean;Dry;Intact;Changed Clean;Dry;Intact  Dressing Change Frequency PRN --  Margins Attached edges (approximated) --  Drainage Amount None --     No Linked orders to display     Wound / Incision (Open or Dehisced) 08/04/21  Thigh Anterior;Left;Medial unstageable wound (Active)  Date First Assessed/Time First Assessed: 08/04/21 0930   Wound Type: (c)   Location: Thigh  Location Orientation: Anterior;Left;Medial  Wound Description (Comments): unstageable wound    Assessments 08/02/2021  6:10 PM 08/19/2021  7:30 AM  Dressing Changed Changed --  Dressing Change Frequency Twice a day Daily  Site / Wound Assessment -- Dressing in place / Unable to assess  Margins Unattached edges (unapproximated) --     No Linked orders to display     Negative Pressure Wound Therapy Thigh Anterior;Left;Medial (Active)  Placement Date/Time: 08/19/21 1633   Wound Type: Incision (Closed wound)  Location: Thigh  Location Orientation: Anterior;Left;Medial    Assessments 08/19/2021  4:55 PM 08/20/2021  9:01 AM  Site / Wound Assessment Clean;Dry Clean;Dry  Peri-wound Assessment Intact Intact  Cycle Continuous Continuous  Target Pressure (mmHg) 125 75  Dressing Status -- Intact  Drainage Amount None Scant  Drainage Description -- Serosanguineous     No Linked orders to display     Incision (Closed) 08/19/21 Leg Left (Active)  Date First Assessed/Time First Assessed: 08/19/21 1636   Location: Leg  Location Orientation: Left    Assessments 08/19/2021  4:55 PM 08/19/2021  5:55 PM  Dressing Type Negative pressure wound therapy Negative pressure wound therapy  Dressing Dry;Clean;Intact Clean;Dry;Intact  Site / Wound Assessment Dressing in place / Unable to assess Dressing in place /  Unable to assess  Drainage Amount None None     No Linked orders to display    Discharge Exam:   Vitals:   08/20/21 2108 08/21/21 0500 08/21/21 0600 08/21/21 1257  BP: 110/69  (!) 82/52 109/63  Pulse: 66  63 66  Resp: 18   18  Temp: 97.6 F (36.4 C)  98 F (36.7 C) 98.4 F (36.9 C)  TempSrc: Oral  Oral Oral  SpO2: 100%  98% 100%  Weight:  123.4 kg    Height:        Body mass index is 45.27 kg/m.  General exam: Pleasant, middle-aged African-American female.  Morbidly obese. skin: No rashes, lesions or ulcers. HEENT: Atraumatic, normocephalic, no obvious bleeding Lungs: Clear to auscultation bilaterally CVS: Regular rate and rhythm, no murmur GI/Abd soft, distended, nontender, bowel sound present CNS: Alert, awake and oriented x3 Psychiatry: Mood appropriate. Extremities: Chronic lymphedema.  Left inner thigh with wound VAC in place.  Time coordinating discharge: 35 minutes   The results of significant diagnostics from this hospitalization (including imaging, microbiology, ancillary and laboratory) are listed below for reference.    Procedures and Diagnostic Studies:   DG Ankle 2 Views Right  Result Date: 06/22/2021 CLINICAL DATA:  Right foot and ankle swelling/cellulitis. EXAM: RIGHT ANKLE - 2 VIEW COMPARISON:  CT, 06/19/2021 FINDINGS: No fracture or bone lesion. There is resorption a of the white cortical line along portions of the calcaneal tuberosity, more subtly along the inferior margin of the medial malleolus, suggesting osteomyelitis. However, the previous CT scan showed no convincing osteomyelitis. Ankle joint is normally spaced and aligned. There are arterial vascular calcifications and diffuse soft tissue swelling. No soft tissue air. IMPRESSION: 1. Radiographic findings suggestive of osteomyelitis of the calcaneal tuberosity and more subtly of the inferior aspect of the medial malleolus, not evident, however, on the recent CT. 2. No fracture or bone lesion. 3.  Diffuse soft tissue swelling.  No soft tissue air. Electronically Signed   By: Lajean Manes M.D.   On: 06/22/2021 08:44   DG Chest Port 1 View  Result Date: 06/21/2021 CLINICAL DATA:  Weakness, fatigue, and hypotension after recent admission for cellulitis. EXAM: PORTABLE CHEST 1 VIEW COMPARISON:  06/30/2020 FINDINGS: Cardiac enlargement. Prominent left pulmonary outflow tract. No airspace disease or consolidation in the lungs. No pleural effusions. No pneumothorax. Calcification of the aorta. No change since prior study. IMPRESSION: Cardiac enlargement.  No evidence of active pulmonary disease. Electronically Signed   By: Lucienne Capers M.D.   On: 06/21/2021 22:36   DG Foot 2 Views Right  Result Date: 06/22/2021 CLINICAL DATA:  Swelling and cellulitis in the right foot and ankle. EXAM: RIGHT FOOT - 2 VIEW COMPARISON:  Right ankle 06/22/2021 FINDINGS: Diffuse soft tissue swelling throughout the right foot. Negative for fracture or dislocation. Mild spurring and sclerosis involving the calcaneus. Diffuse vascular calcifications. IMPRESSION: No acute bone abnormality to the right foot. Electronically Signed   By: Markus Daft M.D.   On: 06/22/2021 08:41  ECHOCARDIOGRAM COMPLETE  Result Date: 06/22/2021    ECHOCARDIOGRAM REPORT   Patient Name:   [name] Date of Exam: 06/22/2021 Medical Rec #:  [id number]        Height:       65.0 in Accession #:    [id number]       Weight:       286.8 lb Date of Birth:  21-Dec-1965        BSA:          2.306 m Patient Age:    55 years         BP:           86/58 mmHg Patient Gender: F                HR:           87 bpm. Exam Location:  Inpatient Procedure: 2D Echo, Color Doppler, Cardiac Doppler and Intracardiac            Opacification Agent                          STAT ECHO Reported to: Dr Rudean Haskell on 06/22/2021 8:57:00 AM. Indications:    Elevated troponin  History:        Patient has prior history of Echocardiogram examinations, most                  recent 07/12/2017. CHF; Risk Factors:Hypertension, Diabetes and                 Dyslipidemia. ESRD. Cor pulmonale.  Sonographer:    Clayton Lefort RDCS (AE) Referring Phys: [id number] Julian Hy  Sonographer Comments: Technically difficult study due to poor echo windows and patient is morbidly obese. Image acquisition challenging due to patient body habitus. IMPRESSIONS  1. Left ventricular ejection fraction, by estimation, is 60 to 65%. The left ventricle has normal function. The left ventricle has no regional wall motion abnormalities. There is moderate concentric left ventricular hypertrophy. Left ventricular diastolic parameters are consistent with Grade I diastolic dysfunction (impaired relaxation). The left ventricular cavity is small and may be underfilled. There is no evidence of LVTO Obstruction.  2. Right ventricular systolic function is moderately to severely reduced. The right ventricular size is severely enlarged. There is severely elevated pulmonary artery systolic pressure. The estimated right ventricular systolic pressure is 14.9 mmHg.  3. Tricuspid valve regurgitation is moderate to severe and functional in nature.  4. Right atrial size was severely dilated.  5. The aortic valve is tricuspid. There is mild thickening of the aortic valve. Aortic valve regurgitation is not visualized. No aortic stenosis is present. Aortic valve mean gradient measures 4.0 mmHg.  6. The mitral valve is grossly normal. No evidence of mitral valve regurgitation. The mean mitral valve gradient is 1.0 mmHg with average heart rate of 79 bpm.  7. The inferior vena cava is dilated in size with <50% respiratory variability, suggesting right atrial pressure of 15 mmHg. Comparison(s): A prior study was performed on 07/12/2017. Prior images reviewed side by side. RV function has worsened. FINDINGS  Left Ventricle: Left ventricular ejection fraction, by estimation, is 60 to 65%. The left ventricle has normal function. The left ventricle  has no regional wall motion abnormalities. Definity contrast agent was given IV to delineate the left ventricular  endocardial borders. The left ventricular internal cavity size was small. There is moderate concentric left ventricular hypertrophy. Left ventricular  diastolic parameters are consistent with Grade I diastolic dysfunction (impaired relaxation). Right Ventricle: The right ventricular size is severely enlarged. Right vetricular wall thickness was not assessed. Right ventricular systolic function is moderately reduced. There is severely elevated pulmonary artery systolic pressure. The tricuspid regurgitant velocity is 3.67 m/s, and with an assumed right atrial pressure of 15 mmHg, the estimated right ventricular systolic pressure is 98.3 mmHg. Left Atrium: Left atrial size was normal in size. Right Atrium: Right atrial size was severely dilated. Pericardium: There is no evidence of pericardial effusion. Mitral Valve: The mitral valve is grossly normal. Mild mitral annular calcification. No evidence of mitral valve regurgitation. MV peak gradient, 2.7 mmHg. The mean mitral valve gradient is 1.0 mmHg with average heart rate of 79 bpm. Tricuspid Valve: The tricuspid valve is normal in structure. Tricuspid valve regurgitation is moderate to severe. Aortic Valve: The aortic valve is tricuspid. There is mild thickening of the aortic valve. Aortic valve regurgitation is not visualized. No aortic stenosis is present. Aortic valve mean gradient measures 4.0 mmHg. Aortic valve peak gradient measures 8.1 mmHg. Aortic valve area, by VTI measures 2.24 cm. Pulmonic Valve: Discordance between Color and Spectral Doppler. The pulmonic valve was not well visualized. Pulmonic valve regurgitation is mild. No evidence of pulmonic stenosis. Aorta: The aortic root and ascending aorta are structurally normal, with no evidence of dilitation. Venous: The inferior vena cava is dilated in size with less than 50% respiratory  variability, suggesting right atrial pressure of 15 mmHg. IAS/Shunts: The atrial septum is grossly normal.  LEFT VENTRICLE PLAX 2D LVIDd:         3.30 cm  Diastology LVIDs:         1.80 cm  LV e' medial:    8.38 cm/s LV PW:         1.80 cm  LV E/e' medial:  9.8 LV IVS:        1.40 cm  LV e' lateral:   12.70 cm/s LVOT diam:     1.90 cm  LV E/e' lateral: 6.4 LV SV:         53 LV SV Index:   23 LVOT Area:     2.84 cm  RIGHT VENTRICLE             IVC RV Basal diam:  4.80 cm     IVC diam: 2.60 cm RV Mid diam:    4.60 cm RV S prime:     10.00 cm/s TAPSE (M-mode): 1.7 cm LEFT ATRIUM             Index       RIGHT ATRIUM           Index LA diam:        3.30 cm 1.43 cm/m  RA Area:     31.60 cm LA Vol (A2C):   78.7 ml 34.13 ml/m RA Volume:   129.00 ml 55.95 ml/m LA Vol (A4C):   76.9 ml 33.35 ml/m LA Biplane Vol: 78.1 ml 33.87 ml/m  AORTIC VALVE AV Area (Vmax):    2.08 cm AV Area (Vmean):   1.95 cm AV Area (VTI):     2.24 cm AV Vmax:           142.00 cm/s AV Vmean:          96.200 cm/s AV VTI:            0.235 m AV Peak Grad:      8.1 mmHg AV Mean Grad:  4.0 mmHg LVOT Vmax:         104.00 cm/s LVOT Vmean:        66.000 cm/s LVOT VTI:          0.186 m LVOT/AV VTI ratio: 0.79  AORTA Ao Root diam: 3.10 cm Ao Asc diam:  3.40 cm MITRAL VALVE               TRICUSPID VALVE MV Area (PHT): 3.45 cm    TR Peak grad:   53.9 mmHg MV Area VTI:   2.29 cm    TR Vmax:        367.00 cm/s MV Peak grad:  2.7 mmHg MV Mean grad:  1.0 mmHg    SHUNTS MV Vmax:       0.82 m/s    Systemic VTI:  0.19 m MV Vmean:      54.4 cm/s   Systemic Diam: 1.90 cm MV Decel Time: 220 msec MV E velocity: 81.80 cm/s MV A velocity: 67.60 cm/s MV E/A ratio:  1.21 Rudean Haskell MD Electronically signed by Rudean Haskell MD Signature Date/Time: 06/22/2021/9:24:35 AM    Final    US Abdomen Limited RUQ (LIVER/GB)  Result Date: 06/22/2021 CLINICAL DATA:  Elevated liver function studies. EXAM: ULTRASOUND ABDOMEN LIMITED RIGHT UPPER QUADRANT  COMPARISON:  CT 06/19/2021 FINDINGS: Gallbladder: Gallbladder is surgically absent. Common bile duct: Diameter: 3 mm, normal Liver: Coarsening of liver echotexture with nodular hepatic contour suggesting cirrhosis. No focal lesions identified. Small amount of free fluid consistent with ascites. Portal vein is patent on color Doppler imaging with normal direction of blood flow towards the liver. Other: None. IMPRESSION: Gallbladder surgically absent. Probable cirrhotic changes in the liver. Small amount of free fluid consistent with ascites. Electronically Signed   By: Lucienne Capers M.D.   On: 06/22/2021 02:56     Labs:   Basic Metabolic Panel: Recent Labs  Lab 08/17/21 0721 08/19/21 0811 08/21/21 0634  NA 130* 133* 130*  K 4.4 3.5 4.1  CL 93* 95* 93*  CO2 21* 24 18*  GLUCOSE 120* 128* 146*  BUN 76* 46* 55*  CREATININE 7.41* 5.20* 6.46*  CALCIUM 8.1* 8.1* 7.2*  PHOS 4.8* 3.3 4.5   GFR Estimated Creatinine Clearance: 13 mL/min (A) (by C-G formula based on SCr of 6.46 mg/dL (H)). Liver Function Tests: Recent Labs  Lab 08/17/21 0721 08/19/21 0811 08/21/21 0634  ALBUMIN 3.6 3.7 3.4*   No results for input(s): LIPASE, AMYLASE in the last 168 hours. No results for input(s): AMMONIA in the last 168 hours. Coagulation profile No results for input(s): INR, PROTIME in the last 168 hours.  CBC: Recent Labs  Lab 08/17/21 0721 08/19/21 0811 08/21/21 0634  WBC 9.8 9.7 10.5  HGB 11.5* 11.5* 11.8*  HCT 34.4* 34.8* 35.8*  MCV 89.8 91.1 91.3  PLT 210 228 212   Cardiac Enzymes: No results for input(s): CKTOTAL, CKMB, CKMBINDEX, TROPONINI in the last 168 hours. BNP: Invalid input(s): POCBNP CBG: Recent Labs  Lab 08/20/21 1118 08/20/21 1547 08/20/21 2106 08/21/21 0646 08/21/21 1129  GLUCAP 160* 197* 133* 153* 144*   D-Dimer No results for input(s): DDIMER in the last 72 hours. Hgb A1c No results for input(s): HGBA1C in the last 72 hours. Lipid Profile No results for  input(s): CHOL, HDL, LDLCALC, TRIG, CHOLHDL, LDLDIRECT in the last 72 hours. Thyroid function studies No results for input(s): TSH, T4TOTAL, T3FREE, THYROIDAB in the last 72 hours.  Invalid input(s): FREET3 Anemia work up No results for  input(s): VITAMINB12, FOLATE, FERRITIN, TIBC, IRON, RETICCTPCT in the last 72 hours. Microbiology Recent Results (from the past 240 hour(s))  Surgical pcr screen     Status: None   Collection Time: 08/18/21  9:08 PM   Specimen: Nasal Mucosa; Nasal Swab  Result Value Ref Range Status   MRSA, PCR NEGATIVE NEGATIVE Final   Staphylococcus aureus NEGATIVE NEGATIVE Final    Comment: (NOTE) The Xpert SA Assay (FDA approved for NASAL specimens in patients 48 years of age and older), is one component of a comprehensive surveillance program. It is not intended to diagnose infection nor to guide or monitor treatment. Performed at [facility], [place] [address] [place], [place] [address]      Signed: Terrilee Croak  [facility] 08/21/2021, 2:32 PM

## 2021-08-21 NOTE — Progress Notes (Signed)
PROGRESS NOTE  [name]  DOB: 1966/05/22  PCP: Noreene Larsson, NP ZOX:[id number]  DOA: 06/21/2021  LOS: 51 days  Hospital Day: 48   Chief Complaint  Patient presents with   Hypotension    Brief narrative: [name] is a 55 y.o. female with PMH significant for ESRD on HD, morbid obesity, DM2, HTN, CHF, chronic lymphedema, COPD. Patient presented to the ED on 06/21/2021 with complaint of right hip and leg pain with evidence of cellulitis.   She was started on empiric antibiotics but continued to have issues with hypotension with evidence of cardiogenic shock from biventricular failure and pulmonary hypertension.  She was managed in the ICU with vasopressors and hemodialysis. She has had prolonged hospitalization. For the last 1 week, she waited for IND to be done by plastic surgeon on 8/31. I&D was done as planned.  Currently has wound VAC.  Patient is recommended for SNF but she wants to go home with services.  Subjective: Patient was seen and examined this morning. Lying on bed.  Not in distress.   Pain controlled.  Wound VAC in place.  Patient states that she does not want SNF and wants to go home with services.  She states he has enough support at home.  Assessment/Plan: Left medial thigh wound -Underwent I&D on 8/31 by plastic surgery.  Has a wound VAC in place.  Wound care per plastic surgery. -Ordered for home health RN for wound [facility] management.  Cardiogenic shock -resolved Acute exacerbation of chronic right ventricular failure with pulmonary hypertension -patient required Vasopressor support in ICU.  Currently on midodrine.   -EF 55 to 60% on last echo from 7/15   ESRD-HD-MWF Chronic hypertension -Nephrology following.  Continue midodrine.  Prolonged bleeding/oozing from AV fistula -8/25, patient underwent fistulogram by IR.  Did have tortuous AV fistula but no evidence of bleeding.  Continue apixaban.  If she starts to bleed, need to cut back on apixaban  from 5 mg to 2.5 mg.     Infected RUE midline catheter -Midline removed and patient treated with linezolid for 5 days. Resolved.   Atypical atrial flutter -Patient underwent DCCV on 7/15 with conversion to normal sinus rhythm. -Continue amiodarone and metoprolol.  Heart rate stable. Continue Eliquis for anticoagulation.    Possible OSA/OHS Pulmonary hypertension -Started on CPAP during hospitalization -Continue CPAP with recommendation for outpatient sleep study   Fibromyalgia -Continue Cymbalta   Anemia of chronic disease -Hemoglobin stable. Recent Labs    07/27/21 0703 07/29/21 0040 07/31/21 0037 08/04/21 0043 08/06/21 1344 08/10/21 0752 08/12/21 0843 08/17/21 0721 08/19/21 0811 08/21/21 0634  HGB 10.4* 9.9* 10.5* 11.0* 11.3* 10.5* 10.7* 11.5* 11.5* 11.8*    Diabetes mellitus, type 2 -Hemoglobin A1c of 6.9%. Not on any antidiabetic at this time. -Blood sugar level controlled. Recent Labs  Lab 08/20/21 0643 08/20/21 1118 08/20/21 1547 08/20/21 2106 08/21/21 0646  GLUCAP 182* 160* 197* 133* 153*    Morbid obesity  -Body mass index is 45.27 kg/m. Patient has been advised to make an attempt to improve diet and exercise patterns to aid in weight loss.   Weakness PT/OT recommended SNF.  Patient wants to go home with services.   Mobility: PT follow-up Code Status:   Code Status: Full Code  Nutritional status: Body mass index is 45.27 kg/m. Nutrition Problem: Increased nutrient needs Etiology: wound healing Signs/Symptoms: estimated needs Diet:  Diet Order             Diet Carb Modified  Fluid consistency: Thin; Room service appropriate? Yes  Diet effective now                  DVT prophylaxis: apixaban (ELIQUIS) tablet 5 mg    Antimicrobials: None Fluid: None Consultants: Nephrology Family Communication: None at bedside  Status is: Inpatient  Remains inpatient appropriate because -pending arrangements for home health, DME's  Dispo: The  patient is from: Home              Anticipated d/c is to: SNF was recommended but patient wants to go home.  Ordered for Home with home health PT, OT, RN, aide, social work and hospital bed              Patient currently is medically stable to d/c.   Difficult to place patient No     Infusions:   sodium chloride     promethazine (PHENERGAN) injection (IM or IVPB) 12.5 mg (08/08/21 0901)    Scheduled Meds:  amiodarone  200 mg Oral Daily   apixaban  5 mg Oral BID   cinacalcet  30 mg Oral Q M,W,F-HD   [START ON 08/24/2021] darbepoetin (ARANESP) injection - DIALYSIS  25 mcg Intravenous Q Mon-HD   DULoxetine  20 mg Oral Daily   feeding supplement (NEPRO CARB STEADY)  237 mL Oral TID BM   HYDROcodone-acetaminophen  1 tablet Oral Q6H   insulin aspart  0-15 Units Subcutaneous TID WC   insulin aspart  0-5 Units Subcutaneous QHS   mouth rinse  15 mL Mouth Rinse BID   methocarbamol  750 mg Oral TID   metoprolol tartrate  12.5 mg Oral BID   midodrine  15 mg Oral TID WC   multivitamin  1 tablet Oral QHS   pantoprazole  40 mg Oral BID   polyethylene glycol  17 g Oral Daily   senna  1 tablet Oral Daily    Antimicrobials: Anti-infectives (From admission, onward)    Start     Dose/Rate Route Frequency Ordered Stop   08/19/21 1615  ceFAZolin 1 g / gentamicin 80 mg in NS 500 mL surgical irrigation  Status:  Discontinued          As needed 08/19/21 1619 08/19/21 1648   08/19/21 0600  ceFAZolin (ANCEF) IVPB 3g/100 mL premix        3 g 200 mL/hr over 30 Minutes Intravenous On call to O.R. 08/18/21 2051 08/19/21 1610   08/06/21 1530  piperacillin-tazobactam (ZOSYN) IVPB 2.25 g        2.25 g 100 mL/hr over 30 Minutes Intravenous Every 8 hours 08/06/21 1434 08/10/21 2347   07/16/21 2000  DAPTOmycin (CUBICIN) 500 mg in sodium chloride 0.9 % IVPB  Status:  Discontinued        6 mg/kg  87.4 kg (Adjusted) 120 mL/hr over 30 Minutes Intravenous Every 48 hours 07/15/21 1138 07/16/21 1043   07/16/21  1200  linezolid (ZYVOX) tablet 600 mg        600 mg Oral Every 12 hours 07/16/21 1101 07/21/21 2018   07/15/21 1000  ceFEPIme (MAXIPIME) 1 g in sodium chloride 0.9 % 100 mL IVPB  Status:  Discontinued        1 g 200 mL/hr over 30 Minutes Intravenous Every 24 hours 07/15/21 0913 07/16/21 1043   07/15/21 0900  cefTRIAXone (ROCEPHIN) 1 g in sodium chloride 0.9 % 100 mL IVPB  Status:  Discontinued        1 g 200 mL/hr over 30  Minutes Intravenous Every 24 hours 07/15/21 0752 07/15/21 0913   07/15/21 0500  DAPTOmycin (CUBICIN) 500 mg in sodium chloride 0.9 % IVPB  Status:  Discontinued        6 mg/kg  87.4 kg (Adjusted) 120 mL/hr over 30 Minutes Intravenous Every 48 hours 07/15/21 0404 07/15/21 1138   06/23/21 0930  DAPTOmycin (CUBICIN) 500 mg in sodium chloride 0.9 % IVPB        6 mg/kg  86.2 kg (Adjusted) 120 mL/hr over 30 Minutes Intravenous Every 48 hours 06/23/21 0831 06/28/21 0130   06/23/21 0915  DAPTOmycin (CUBICIN) 800 mg in sodium chloride 0.9 % IVPB  Status:  Discontinued        6 mg/kg  130.1 kg 132 mL/hr over 30 Minutes Intravenous Every 48 hours 06/23/21 0825 06/23/21 0831   06/22/21 2200  ceFEPIme (MAXIPIME) 1 g in sodium chloride 0.9 % 100 mL IVPB  Status:  Discontinued        1 g 200 mL/hr over 30 Minutes Intravenous Every 24 hours 06/22/21 0001 06/23/21 0825   06/22/21 0800  metroNIDAZOLE (FLAGYL) IVPB 500 mg  Status:  Discontinued        500 mg 100 mL/hr over 60 Minutes Intravenous Every 8 hours 06/22/21 0716 06/23/21 0825   06/21/21 2230  doxycycline (VIBRAMYCIN) 100 mg in sodium chloride 0.9 % 250 mL IVPB  Status:  Discontinued        100 mg 125 mL/hr over 120 Minutes Intravenous Every 12 hours 06/21/21 2223 06/23/21 0825   06/21/21 2215  ceFEPIme (MAXIPIME) 2 g in sodium chloride 0.9 % 100 mL IVPB        2 g 200 mL/hr over 30 Minutes Intravenous  Once 06/21/21 2208 06/21/21 2245   06/21/21 2215  metroNIDAZOLE (FLAGYL) IVPB 500 mg        500 mg 100 mL/hr over 60  Minutes Intravenous  Once 06/21/21 2208 06/21/21 2340       PRN meds: acetaminophen, dextromethorphan-guaiFENesin, diphenhydrAMINE, docusate sodium, HYDROmorphone (DILAUDID) injection, ipratropium-albuterol, ondansetron (ZOFRAN) IV, polyethylene glycol, promethazine (PHENERGAN) injection (IM or IVPB)   Objective: Vitals:   08/20/21 2108 08/21/21 0600  BP: 110/69 (!) 82/52  Pulse: 66 63  Resp: 18   Temp: 97.6 F (36.4 C) 98 F (36.7 C)  SpO2: 100% 98%    Intake/Output Summary (Last 24 hours) at 08/21/2021 1105 Last data filed at 08/20/2021 1130 Gross per 24 hour  Intake 125 ml  Output --  Net 125 ml   Filed Weights   08/19/21 1042 08/20/21 0500 08/21/21 0500  Weight: 120.3 kg 122.5 kg 123.4 kg   Weight change: -1.4 kg Body mass index is 45.27 kg/m.   Physical Exam: General exam: Pleasant, middle-aged African-American female.  Morbidly obese. skin: No rashes, lesions or ulcers. HEENT: Atraumatic, normocephalic, no obvious bleeding Lungs: Clear to auscultation bilaterally CVS: Regular rate and rhythm, no murmur GI/Abd soft, distended, nontender, bowel sound present CNS: Alert, awake and oriented x3 Psychiatry: Mood appropriate. Extremities: Chronic lymphedema.  Left inner thigh with wound VAC in place.  Data Review: I have personally reviewed the laboratory data and studies available.  Recent Labs  Lab 08/17/21 0721 08/19/21 0811 08/21/21 0634  WBC 9.8 9.7 10.5  HGB 11.5* 11.5* 11.8*  HCT 34.4* 34.8* 35.8*  MCV 89.8 91.1 91.3  PLT 210 228 212   Recent Labs  Lab 08/17/21 0721 08/19/21 0811 08/21/21 0634  NA 130* 133* 130*  K 4.4 3.5 4.1  CL 93* 95*  93*  CO2 21* 24 18*  GLUCOSE 120* 128* 146*  BUN 76* 46* 55*  CREATININE 7.41* 5.20* 6.46*  CALCIUM 8.1* 8.1* 7.2*  PHOS 4.8* 3.3 4.5    F/u labs ordered Unresulted Labs (From admission, onward)     Start     Ordered   08/23/21 0500  CBC  Once,   R       Question:  Specimen collection method  Answer:   Lab=Lab collect   08/18/21 1523   Signed and Held  Renal function panel  Once,   R       Question:  Specimen collection method  Answer:  Lab=Lab collect   Signed and Held   Signed and Held  CBC  Once,   R       Question:  Specimen collection method  Answer:  Lab=Lab collect   Signed and Held            Signed, Terrilee Croak, MD [facility] 08/21/2021

## 2021-08-21 NOTE — TOC Progression Note (Signed)
Transition of Care [facility]) - Progression Note    Patient Details  Name: JEYLI ZWICKER MRN: [id number] Date of Birth: 1966/04/22  Transition of Care [facility]) CM/SW Contact  Sharlet Salina Mila Homer, LCSW Phone Number: 08/21/2021, 11:12 AM  Clinical Narrative:  Met with patient at bedside to discuss [facility], pending insurance authorization. Patient informed CSW that she will be discharging home. Her daughter has come from out-of-town to be with her and she will be with her for 6 weeks. Once she leaves her son will be with her, and per patient her husband is retired. Mrs. Yearwood informed CSW that she will need a hospital bed. Patient advised that the nurse case manager will come and talk with her regarding [facility] services. Darnelle Bos, nurse case manager informed.  Call made to [facility], admissions director at [facility] and update provided regarding change in discharge plan.  Expected Discharge Plan: Skilled Nursing Facility Barriers to Discharge: Continued Medical Work up, Ship broker  Expected Discharge Plan and Services Expected Discharge Plan: [place]  -  Discharge plan has changed from SNF to home. In-house Referral: Clinical Social Work   Post Acute Care Choice: [place] Living arrangements for the past 2 months: Single Family Home                                       Social Determinants of Health (SDOH) Interventions Food Insecurity Interventions: Intervention Not Indicated Financial Strain Interventions: Intervention Not Indicated Housing Interventions: Intervention Not Indicated Transportation Interventions: Intervention Not Indicated  Readmission Risk Interventions Readmission Risk Prevention Plan 04/30/2019  Transportation Screening Complete  Medication Review Press photographer) Complete  PCP or Specialist appointment within 3-5 days of discharge Complete  HRI or [place] Complete  SW  Recovery Care/Counseling Consult Complete  [place] Not Applicable  Some recent data might be hidden

## 2021-08-21 NOTE — Progress Notes (Signed)
Called patient to discuss our recommendations for ongoing care of her left thigh wound.  I discussed with her that we do recommend she go to the skilled nursing facility after discharge from the hospital as she has a very complex wound and this will require dressing changes multiple times per week.  I discussed with her that it was my understanding that home health was approved, however they did not have staffing to support her needs.  She stated that she has a lot of family that will be around including sister coming into town and family that lives nearby.  She reports that she will have approximately 4 to 6 weeks of family for assistance.  I discussed with her that although it is great to have family, we would have to be sure that they were comfortable with doing dressing changes and wound VAC changes.  I did discuss with her and strongly urged that she reconsider SNF.  She seemed confident that her family would be able to provide care.  She reports that she is feeling well today.  I did answer any questions that she had about her care.  I recommend she call with questions or other concerns.

## 2021-08-21 NOTE — Progress Notes (Signed)
Patient ID: [name], female   DOB: 10-20-1966, 55 y.o.   MRN: [id number] S: no acute events overnight. Patient is eager to go home. No complaints. O:BP (!) 82/52 (BP Location: Right Arm)   Pulse 63   Temp 98 F (36.7 C) (Oral)   Resp 18   Ht _0  (1.651 m)   Wt 123.4 kg   SpO2 98%   BMI 45.27 kg/m  No intake or output data in the 24 hours ending 08/21/21 1224  Intake/Output: I/O last 3 completed shifts: In: 125 [P.O.:125] Out: -   Intake/Output this shift:  No intake/output data recorded. Weight change: -1.4 kg Gen:NAD CVS: RRR Resp: CTA Abd: +BS, soft, NT/Nd Ext: 1+ edema, wound vac on left thigh.  Recent Labs  Lab 08/17/21 0721 08/19/21 0811 08/21/21 0634  NA 130* 133* 130*  K 4.4 3.5 4.1  CL 93* 95* 93*  CO2 21* 24 18*  GLUCOSE 120* 128* 146*  BUN 76* 46* 55*  CREATININE 7.41* 5.20* 6.46*  ALBUMIN 3.6 3.7 3.4*  CALCIUM 8.1* 8.1* 7.2*  PHOS 4.8* 3.3 4.5   Liver Function Tests: Recent Labs  Lab 08/17/21 0721 08/19/21 0811 08/21/21 0634  ALBUMIN 3.6 3.7 3.4*   No results for input(s): LIPASE, AMYLASE in the last 168 hours. No results for input(s): AMMONIA in the last 168 hours. CBC: Recent Labs  Lab 08/17/21 0721 08/19/21 0811 08/21/21 0634  WBC 9.8 9.7 10.5  HGB 11.5* 11.5* 11.8*  HCT 34.4* 34.8* 35.8*  MCV 89.8 91.1 91.3  PLT 210 228 212   Cardiac Enzymes: No results for input(s): CKTOTAL, CKMB, CKMBINDEX, TROPONINI in the last 168 hours. CBG: Recent Labs  Lab 08/20/21 1118 08/20/21 1547 08/20/21 2106 08/21/21 0646 08/21/21 1129  GLUCAP 160* 197* 133* 153* 144*    Iron Studies: No results for input(s): IRON, TIBC, TRANSFERRIN, FERRITIN in the last 72 hours. Studies/Results: No results found.  amiodarone  200 mg Oral Daily   apixaban  5 mg Oral BID   cinacalcet  30 mg Oral Q M,W,F-HD   [START ON 08/24/2021] darbepoetin (ARANESP) injection - DIALYSIS  25 mcg Intravenous Q Mon-HD   DULoxetine  20 mg Oral Daily   feeding  supplement (NEPRO CARB STEADY)  237 mL Oral TID BM   HYDROcodone-acetaminophen  1 tablet Oral Q6H   insulin aspart  0-15 Units Subcutaneous TID WC   insulin aspart  0-5 Units Subcutaneous QHS   mouth rinse  15 mL Mouth Rinse BID   methocarbamol  750 mg Oral TID   metoprolol tartrate  12.5 mg Oral BID   midodrine  15 mg Oral TID WC   multivitamin  1 tablet Oral QHS   pantoprazole  40 mg Oral BID   polyethylene glycol  17 g Oral Daily   senna  1 tablet Oral Daily    BMET    Component Value Date/Time   NA 130 (L) 08/21/2021 0634   K 4.1 08/21/2021 0634   CL 93 (L) 08/21/2021 0634   CO2 18 (L) 08/21/2021 0634   GLUCOSE 146 (H) 08/21/2021 0634   BUN 55 (H) 08/21/2021 0634   CREATININE 6.46 (H) 08/21/2021 0634   CALCIUM 7.2 (L) 08/21/2021 0634   CALCIUM 8.5 09/06/2012 1056   GFRNONAA 7 (L) 08/21/2021 0634   GFRAA 5 (L) 07/04/2020 0732   GFRAA 5 (L) 07/04/2020 0732   CBC    Component Value Date/Time   WBC 10.5 08/21/2021 0634   RBC 3.92 08/21/2021  5476   HGB 11.8 (L) 08/21/2021 0634   HCT 35.8 (L) 08/21/2021 0634   PLT 212 08/21/2021 0634   MCV 91.3 08/21/2021 0634   MCH 30.1 08/21/2021 0634   MCHC 33.0 08/21/2021 0634   RDW 18.9 (H) 08/21/2021 0634   LYMPHSABS 1.3 08/12/2021 0843   MONOABS 0.5 08/12/2021 0843   EOSABS 0.1 08/12/2021 0843   BASOSABS 0.0 08/12/2021 0843    OUTPATIENT HD:  [facility], MWF, edw 135.5kg, 1K/2.5Ca, BFR 350, DFR 500, LAVF access, epo 1200 units every HD  Assessment/Plan:   Cardiogenic shock - due to acute on chronic RVF and pulmonary HTN.  improved ESRD - continue with HD on MWF schedule.  Left thigh wound - seen by plastic surgery and s/p surgical debridement and wound vac placement 08/19/21. Anemia of ESRD - stable Atypical atrial flutter - s/p DCCV on 07/03/21 to NSR, on amiodarone, metoprolol, and on eliquis. CKD-MBD - continue with home meds Hypotension - tolerating HD with UF on midodrine.  Disposition - hopeful discharge  soon    Gean Quint, MD [facility]

## 2021-08-21 NOTE — Progress Notes (Addendum)
PT Cancellation Note  Patient Details Name: [name] MRN: [id number] DOB: [birth year]   Cancelled Treatment:    Reason Eval/Treat Not Completed: Patient at procedure or test/unavailable (HDU). PT will follow up as time allows. PT has provided updated DME recommendations to case manager and MD during the day for discharge home.   Zenaida Niece 08/21/2021, 5:20 PM

## 2021-08-21 NOTE — Progress Notes (Signed)
Nutrition Follow Up  DOCUMENTATION CODES:  Not applicable  INTERVENTION:  -decrease to Nepro Shake po daily, each supplement provides 425 kcal and 19 grams protein -Continue renal MVI daily   NUTRITION DIAGNOSIS:  Increased nutrient needs related to wound healing as evidenced by estimated needs. -- ongoing  GOAL:  Patient will meet greater than or equal to 90% of their needs -- progressing  MONITOR:  PO intake, Supplement acceptance, Weight trends, I & O's, Labs  REASON FOR ASSESSMENT:  Rounds    ASSESSMENT:  Patient with PMH significant for thyroid cancer, CHF, Chiari malformation, ESRD/HD, COPD, CAD, HLD, HTN, DM, and lymphedema. Presents this admission with cardiogenic shock from RV failure.  7/15- s/p DC cardioversion 8/25- L arm fistulogram 8/31- s/p excision of L thigh wound, placement of Myriad powder and woundVAC  Pt w/o complaints at this time. Reports pain is well controlled. Pt does not want to go to SNF but instead prefers to d/c home with [facility]. Pt eager to go home. Pt continues to report good appetite. Most meal completions documented as 100% since last RD assessment. Per RN, pt has been refusing Nepro shakes over the last couple of days. Will decrease frequency of supplements as po intake has improved.  Last HD 8/31, 4L net UF Pre-HD wt: 124.8 kg Post-HD wt: 120.3 kg Current wt: 123.4 kg   No UOP documented x24 hours  Medications: sensipar, aranesp, SSI, rena-vit, protonix, miralax, senokot Labs: Na 130 (L), BUN 55 (H), Cr 6.46 (H)  CBGs 133-197 x24 hours  Diet Order:   Diet Order             Diet Carb Modified Fluid consistency: Thin; Room service appropriate? Yes  Diet effective now                  EDUCATION NEEDS:  Education needs have been addressed  Skin:  Skin Assessment: Skin Integrity Issues: Skin Integrity Issues:: Incisions Stage II: R thigh Unstageable: L thigh Incisions: L thigh  Last BM:  8/29  Height:  Ht Readings from Last  1 Encounters:  07/16/21 5' 5" (1.651 m)   Weight:  Wt Readings from Last 1 Encounters:  08/21/21 123.4 kg   BMI:  Body mass index is 45.27 kg/m.  Estimated Nutritional Needs:  Kcal:  1800-2000 kcal Protein:  90-105 grams Fluid:  >/= 1.8 L/day    Larkin Ina, MS, RD, LDN (she/her/hers) RD pager number and weekend/on-call pager number located in [place].

## 2021-08-21 NOTE — Progress Notes (Signed)
Plastic Surgery ([phone]) was contacted about home wound vac and M. Scheeler, PA stated that Martin Majestic would not be strong enough. He has ordered her a home wound vac and is not certain when that would be delivered.   MD Dahal is aware, and d/c will be cancelled for today.  Will continue to monitor.  Aurther Loft, RN

## 2021-08-21 NOTE — TOC Transition Note (Addendum)
Transition of Care [facility]) - CM/SW Discharge Note   Patient Details  Name: [name] MRN: [id number] Date of Birth: Sep 21, 1966  Transition of Care [facility]) CM/SW Contact:  Tom-Johnson, Renea Ee, RN Phone Number: 08/21/2021, 5:02 PM   Clinical Narrative:    CM consulted for [facility] needs for post hospital transition. SNF previously requested by PT but now patient states she prefers going home with home health as she has good family support. Patient lives at home with husband and grand daughter. She uses [facility] transportation to and from appointments. Has a cane and an electric wheelchair which she use to navigate around. States she  has used [facility] before. Contacted Kenzie with [facility] [phone]) and she stated they are out of network with the insurance patient has now. Called [place] with Alvis Lemmings [phone]) and agreed to referral for home health PT/OT/Aide and Education officer, museum. CM contacted Freda Munro with [employer] for bariatric hospital bed, bariatric walker and bedside commode. Hospital bed,  walker and bedside commode will be delivered to patient's home tomorrow 08/22/21. [facility] will be called by RN for transport when patient is back from dialysis. No further TC needs at this tim   Final next level of care: [place] Barriers to Discharge: Continued Medical Work up   Patient Goals and CMS Choice Patient states their goals for this hospitalization and ongoing recovery are:: To go  home CMS Medicare.gov Compare Post Acute Care list provided to:: Patient Choice offered to / list presented to : Patient  Discharge Placement                       Discharge Plan and Services In-house Referral: Clinical Social Work Discharge Planning Services: CM Consult Post Acute Care Choice: Home Health          DME Arranged: Bedside commode, Walker rolling, Hospital bed (Bariatric) DME Agency: [employer] Date DME Agency Contacted: 08/21/21 Time DME Agency  Contacted: 1234 Representative spoke with at DME Agency: Jodell Cipro HH Arranged: PT, OT, Nurse's Aide, Social Work [employer] Agency: [place] Date [place]: 08/21/21 Time [place]: 1338 Representative spoke with at [place]: [place] ([place]) Interventions Food Insecurity Interventions: Intervention Not Indicated Financial Strain Interventions: Intervention Not Indicated Housing Interventions: Intervention Not Indicated Transportation Interventions: Intervention Not Indicated   Readmission Risk Interventions Readmission Risk Prevention Plan 04/30/2019  Transportation Screening Complete  Medication Review Press photographer) Complete  PCP or Specialist appointment within 3-5 days of discharge Complete  HRI or [place] Complete  SW Recovery Care/Counseling Consult Complete  [place] Not Applicable  Some recent data might be hidden

## 2021-08-22 DIAGNOSIS — I50812 Chronic right heart failure: Secondary | ICD-10-CM | POA: Diagnosis not present

## 2021-08-22 DIAGNOSIS — I9589 Other hypotension: Secondary | ICD-10-CM | POA: Diagnosis not present

## 2021-08-22 DIAGNOSIS — L03116 Cellulitis of left lower limb: Secondary | ICD-10-CM | POA: Diagnosis not present

## 2021-08-22 LAB — GLUCOSE, CAPILLARY
Glucose-Capillary: 173 mg/dL — ABNORMAL HIGH (ref 70–99)
Glucose-Capillary: 154 mg/dL — ABNORMAL HIGH (ref 70–99)
Glucose-Capillary: 145 mg/dL — ABNORMAL HIGH (ref 70–99)
Glucose-Capillary: 119 mg/dL — ABNORMAL HIGH (ref 70–99)

## 2021-08-22 NOTE — Progress Notes (Signed)
Patient ID: [name], female   DOB: 1966-06-23, 55 y.o.   MRN: [id number] S: open for discharge to home once supplies available. No acute events, tolerated HD yesterday, net uf 2.5L O:BP 102/62 (BP Location: Right Arm)   Pulse 71   Temp 98 F (36.7 C) (Oral)   Resp 18   Ht _0  (1.651 m)   Wt 122.5 kg   SpO2 98%   BMI 44.94 kg/m   Intake/Output Summary (Last 24 hours) at 08/22/2021 1250 Last data filed at 08/22/2021 0908 Gross per 24 hour  Intake 720 ml  Output 2500 ml  Net -1780 ml    Intake/Output: I/O last 3 completed shifts: In: 240 [P.O.:240] Out: 2500 [Other:2500]  Intake/Output this shift:  Total I/O In: 480 [P.O.:480] Out: -  Weight change: 1.6 kg Gen:NAD CVS: RRR Resp: CTA Abd: +BS, soft, NT/Nd Ext: 1+ edema, wound vac on left thigh.  Recent Labs  Lab 08/17/21 0721 08/19/21 0811 08/21/21 0634  NA 130* 133* 130*  K 4.4 3.5 4.1  CL 93* 95* 93*  CO2 21* 24 18*  GLUCOSE 120* 128* 146*  BUN 76* 46* 55*  CREATININE 7.41* 5.20* 6.46*  ALBUMIN 3.6 3.7 3.4*  CALCIUM 8.1* 8.1* 7.2*  PHOS 4.8* 3.3 4.5   Liver Function Tests: Recent Labs  Lab 08/17/21 0721 08/19/21 0811 08/21/21 0634  ALBUMIN 3.6 3.7 3.4*   No results for input(s): LIPASE, AMYLASE in the last 168 hours. No results for input(s): AMMONIA in the last 168 hours. CBC: Recent Labs  Lab 08/17/21 0721 08/19/21 0811 08/21/21 0634  WBC 9.8 9.7 10.5  HGB 11.5* 11.5* 11.8*  HCT 34.4* 34.8* 35.8*  MCV 89.8 91.1 91.3  PLT 210 228 212   Cardiac Enzymes: No results for input(s): CKTOTAL, CKMB, CKMBINDEX, TROPONINI in the last 168 hours. CBG: Recent Labs  Lab 08/21/21 0646 08/21/21 1129 08/21/21 2128 08/22/21 0645 08/22/21 1201  GLUCAP 153* 144* 144* 173* 154*    Iron Studies: No results for input(s): IRON, TIBC, TRANSFERRIN, FERRITIN in the last 72 hours. Studies/Results: No results found.  amiodarone  200 mg Oral Daily   apixaban  5 mg Oral BID   cinacalcet  30 mg Oral Q  M,W,F-HD   [START ON 08/24/2021] darbepoetin (ARANESP) injection - DIALYSIS  25 mcg Intravenous Q Mon-HD   DULoxetine  20 mg Oral Daily   feeding supplement (NEPRO CARB STEADY)  237 mL Oral Q24H   insulin aspart  0-15 Units Subcutaneous TID WC   insulin aspart  0-5 Units Subcutaneous QHS   mouth rinse  15 mL Mouth Rinse BID   methocarbamol  750 mg Oral TID   metoprolol tartrate  12.5 mg Oral BID   midodrine  15 mg Oral TID WC   multivitamin  1 tablet Oral QHS   pantoprazole  40 mg Oral BID   polyethylene glycol  17 g Oral Daily   senna  1 tablet Oral Daily    BMET    Component Value Date/Time   NA 130 (L) 08/21/2021 0634   K 4.1 08/21/2021 0634   CL 93 (L) 08/21/2021 0634   CO2 18 (L) 08/21/2021 0634   GLUCOSE 146 (H) 08/21/2021 0634   BUN 55 (H) 08/21/2021 0634   CREATININE 6.46 (H) 08/21/2021 0634   CALCIUM 7.2 (L) 08/21/2021 0634   CALCIUM 8.5 09/06/2012 1056   GFRNONAA 7 (L) 08/21/2021 0634   GFRAA 5 (L) 07/04/2020 0732   GFRAA 5 (L) 07/04/2020 0732  CBC    Component Value Date/Time   WBC 10.5 08/21/2021 0634   RBC 3.92 08/21/2021 0634   HGB 11.8 (L) 08/21/2021 0634   HCT 35.8 (L) 08/21/2021 0634   PLT 212 08/21/2021 0634   MCV 91.3 08/21/2021 0634   MCH 30.1 08/21/2021 0634   MCHC 33.0 08/21/2021 0634   RDW 18.9 (H) 08/21/2021 0634   LYMPHSABS 1.3 08/12/2021 0843   MONOABS 0.5 08/12/2021 0843   EOSABS 0.1 08/12/2021 0843   BASOSABS 0.0 08/12/2021 0843    OUTPATIENT HD:  [facility], MWF, edw 135.5kg, 1K/2.5Ca, BFR 350, DFR 500, LAVF access, epo 1200 units every HD  Assessment/Plan:   Cardiogenic shock - due to acute on chronic RVF and pulmonary HTN.  improved ESRD - continue with HD on MWF schedule.  Left thigh wound - seen by plastic surgery and s/p surgical debridement and wound vac placement 08/19/21. Anemia of ESRD - stable Atypical atrial flutter - s/p DCCV on 07/03/21 to NSR, on amiodarone, metoprolol, and on eliquis. CKD-MBD - continue with  home meds Hypotension - tolerating HD with UF on midodrine.  Disposition - hopeful discharge soon, today?    Gean Quint, MD [facility]

## 2021-08-22 NOTE — Progress Notes (Signed)
Pt places self on/off cpap as needed.  Rt moved device in reach for pt at this time.  RT will continue to monitor.

## 2021-08-22 NOTE — TOC Progression Note (Signed)
Transition of Care [facility]) - Progression Note    Patient Details  Name: [name] MRN: [id number] Date of Birth: 1966/06/05  Transition of Care [facility]) CM/SW Contact  Bartholomew Crews, RN Phone Number: [phone] 08/22/2021, 11:09 AM  Clinical Narrative:     Spoke with liaison at [facility] to discuss patient's home health nursing needs for wound vac. Alvis Lemmings unable to support nursing wound vac needs at this time - referral declined. TOC will continue to search for accepting home health agency.   Expected Discharge Plan: [place] Barriers to Discharge: Continued Medical Work up  Expected Discharge Plan and Services Expected Discharge Plan: [place] In-house Referral: Clinical Social Work Discharge Planning Services: CM Consult Post Acute Care Choice: [place] arrangements for the past 2 months: Single Family Home Expected Discharge Date: 08/21/21               DME Arranged: Bedside commode, Walker rolling, Hospital bed (Bariatric) DME Agency: [employer] Date DME Agency Contacted: 08/21/21 Time DME Agency Contacted: 1234 Representative spoke with at DME Agency: Jodell Cipro HH Arranged: PT, OT, Nurse's Aide, Social Work [employer] Agency: [place] Date [place]: 08/21/21 Time [place]: 1338 Representative spoke with at [place]: [place] ([place]) Interventions Food Insecurity Interventions: Intervention Not Indicated Financial Strain Interventions: Intervention Not Indicated Housing Interventions: Intervention Not Indicated Transportation Interventions: Intervention Not Indicated  Readmission Risk Interventions Readmission Risk Prevention Plan 04/30/2019  Transportation Screening Complete  Medication Review Press photographer) Complete  PCP or Specialist appointment within 3-5 days of discharge Complete  HRI or [place] Complete  SW Recovery Care/Counseling Consult  Complete  [place] Not Applicable  Some recent data might be hidden

## 2021-08-22 NOTE — TOC Progression Note (Signed)
Transition of Care [facility]) - Progression Note    Patient Details  Name: [name] MRN: [id number] Date of Birth: 09/16/1966  Transition of Care [facility]) CM/SW Contact  Bartholomew Crews, RN Phone Number: [phone] 08/22/2021, 1:41 PM  Clinical Narrative:     Spoke with patient at the bedside to discuss transition plans.   Agreeable to [employer] for [facility] RN, PT, OT, Aide, and SW. Start of care for Wednesday. Does not have aide services through her Medicaid - [facility] SW to assist with this process.   Home wound vac has not been delivered to the room at this time.   Spouse has cleared a room for new DME. Pending call from [employer] to schedule delivery. Call to liaison at [place] - one missed attempt yesterday to schedule delivery. [employer] to follow up again today to schedule delivery.    Patient will need nonemergent ambulance transport at discharge.   Verified PCP and pharmacy in [place].   Attends dialysis at [facility] on MWF, and stated that her chair time has been changed from 6am to 11am which she says will work well for her.   TOC following.    Expected Discharge Plan: [place] Barriers to Discharge: Continued Medical Work up  Expected Discharge Plan and Services Expected Discharge Plan: [place] In-house Referral: Clinical Social Work Discharge Planning Services: CM Consult Post Acute Care Choice: [place] arrangements for the past 2 months: Single Family Home Expected Discharge Date: 08/21/21               DME Arranged: Bedside commode, Walker rolling, Hospital bed (Bariatric) DME Agency: [employer] Date DME Agency Contacted: 08/22/21 Time DME Agency Contacted: 1341 Representative spoke with at DME Agency: [place]: RN, PT, OT, Nurse's Aide, Social Work [employer] Agency: [place] Date [place]: 08/22/21 Time [place]: [place] Representative spoke with at [place]:  [place] ([place]) Interventions Food Insecurity Interventions: Intervention Not Indicated Financial Strain Interventions: Intervention Not Indicated Housing Interventions: Intervention Not Indicated Transportation Interventions: Intervention Not Indicated  Readmission Risk Interventions Readmission Risk Prevention Plan 04/30/2019  Transportation Screening Complete  Medication Review Press photographer) Complete  PCP or Specialist appointment within 3-5 days of discharge Complete  HRI or [place] Complete  SW Recovery Care/Counseling Consult Complete  [place] Not Applicable  Some recent data might be hidden

## 2021-08-22 NOTE — TOC Progression Note (Signed)
Transition of Care [facility]) - Progression Note    Patient Details  Name: [name] MRN: [id number] Date of Birth: 05/08/1966  Transition of Care [facility]) CM/SW Contact  Bartholomew Crews, RN Phone Number: [phone] 08/22/2021, 1:19 PM  Clinical Narrative:     [facility] accepted [facility] referral for nursing, PT, OT, SW, and bath aide for start of care on Wednesday.   Expected Discharge Plan: [place] Barriers to Discharge: Continued Medical Work up  Expected Discharge Plan and Services Expected Discharge Plan: [place] In-house Referral: Clinical Social Work Discharge Planning Services: CM Consult Post Acute Care Choice: [place] arrangements for the past 2 months: Single Family Home Expected Discharge Date: 08/21/21               DME Arranged: Bedside commode, Walker rolling, Hospital bed (Bariatric) DME Agency: [employer] Date DME Agency Contacted: 08/21/21 Time DME Agency Contacted: 1234 Representative spoke with at DME Agency: Jodell Cipro HH Arranged: PT, OT, Nurse's Aide, Social Work [employer] Agency: [place] Date [place]: 08/21/21 Time [place]: 1338 Representative spoke with at [place]: [place] ([place]) Interventions Food Insecurity Interventions: Intervention Not Indicated Financial Strain Interventions: Intervention Not Indicated Housing Interventions: Intervention Not Indicated Transportation Interventions: Intervention Not Indicated  Readmission Risk Interventions Readmission Risk Prevention Plan 04/30/2019  Transportation Screening Complete  Medication Review Press photographer) Complete  PCP or Specialist appointment within 3-5 days of discharge Complete  HRI or [place] Complete  SW Recovery Care/Counseling Consult Complete  [place] Not Applicable  Some recent data might be hidden

## 2021-08-22 NOTE — Progress Notes (Signed)
PROGRESS NOTE  [name]  DOB: 1966-09-07  PCP: Noreene Larsson, NP SPQ:[phone]  DOA: 06/21/2021  LOS: 77 days  Hospital Day: 39   Chief Complaint  Patient presents with   Hypotension    Brief narrative: [name] is a 55 y.o. female with PMH significant for ESRD on HD, morbid obesity, DM2, HTN, CHF, chronic lymphedema, COPD. Patient presented to the ED on 06/21/2021 with complaint of right hip and leg pain with evidence of cellulitis.   She was started on empiric antibiotics but continued to have issues with hypotension with evidence of cardiogenic shock from biventricular failure and pulmonary hypertension.  She was managed in the ICU with vasopressors and hemodialysis. See below for details.  08/22/2021: Patient seen.  Input from the plastic surgery team is appreciated.  On 08/19/2021, patient underwent "Excision of left thigh wound 5 x 10 x 1 cm skin, soft tissue and fat.  Placement of Myriad powder 1 gm and sheet 7 x 10 cm.  Placement of VAC".  Patient prefers to be discharged back on.  Transition of care note is not.  Patient is planned to be discharged on Wednesday (4 days time).  Wound care will be continued at home by home health.  Face-to-face/home health orders done.  Subjective: No new complaints. No fever or chills.  Assessment/Plan: Left medial thigh wound -Underwent I&D on 8/31 by plastic surgery.  Has a wound VAC in place.  Wound care per plastic surgery. 08/22/2021: Pursue disposition.  Cardiogenic shock -resolved Acute exacerbation of chronic right ventricular failure with pulmonary hypertension -patient required Vasopressor support in ICU.  Currently on midodrine.   -EF 55 to 60% on last echo from 7/15  08/22/2021: Patient has significant pulmonary hypertension.  Consider cardiology and pulmonary follow-up on discharge.  ESRD-HD-MWF Chronic hypertension -Nephrology following.  Noted that inpatient dialysis have been challenging because of recurrent  hypotension. Encouraged to try and prolong the duration of sitting in a recliner. -Continue midodrine. 08/22/2021: Defer to nephrology team.  Prolonged bleeding/oozing from AV fistula -8/25, patient underwent fistulogram by IR.  Did have tortuous AV fistula but no evidence of bleeding.  Continue apixaban.  If she starts to bleed, need to cut back on apixaban from 5 mg to 2.5 mg.     Infected RUE midline catheter -Midline removed and patient treated with linezolid for 5 days. Resolved.   Atypical atrial flutter -Patient underwent DCCV on 7/15 with conversion to normal sinus rhythm. -Continue amiodarone and metoprolol.  Heart rate stable. Continue Eliquis for anticoagulation.    Possible OSA/OHS Pulmonary hypertension -Started on CPAP during hospitalization -Continue CPAP with recommendation for outpatient sleep study   Fibromyalgia -Continue Cymbalta   Anemia of chronic disease -Hemoglobin stable. Recent Labs    07/27/21 0703 07/29/21 0040 07/31/21 0037 08/04/21 0043 08/06/21 1344 08/10/21 0752 08/12/21 0843 08/17/21 0721 08/19/21 0811 08/21/21 0634  HGB 10.4* 9.9* 10.5* 11.0* 11.3* 10.5* 10.7* 11.5* 11.5* 11.8*     Diabetes mellitus, type 2 -Hemoglobin A1c of 6.9%. Not on any antidiabetic at this time. -Blood sugar level is better in last 24 hours.  Continue to monitor. Recent Labs  Lab 08/21/21 0646 08/21/21 1129 08/21/21 2128 08/22/21 0645 08/22/21 1201  GLUCAP 153* 144* 144* 173* 154*     Morbid obesity  -Body mass index is 44.94 kg/m. Patient has been advised to make an attempt to improve diet and exercise patterns to aid in weight loss.   Weakness PT/OT recommended SNF.  Pressure injury Right/posterior/proximal thigh. Not present on admission.   Mobility: PT eval obtained Code Status:   Code Status: Full Code  Nutritional status: Body mass index is 44.94 kg/m. Nutrition Problem: Increased nutrient needs Etiology: wound healing Signs/Symptoms:  estimated needs Diet:  Diet Order             Diet - low sodium heart healthy           Diet Carb Modified Fluid consistency: Thin; Room service appropriate? Yes  Diet effective now                  DVT prophylaxis: apixaban (ELIQUIS) tablet 5 mg    Antimicrobials: None Fluid: None Consultants: Nephrology Family Communication: None at bedside  Status is: Inpatient  Remains inpatient appropriate because -pending SNF  Dispo: The patient is from: Home              Anticipated d/c is to: SNF              Patient currently is medically stable to d/c.   Difficult to place patient No     Infusions:   sodium chloride     promethazine (PHENERGAN) injection (IM or IVPB) 12.5 mg (08/08/21 0901)    Scheduled Meds:  amiodarone  200 mg Oral Daily   apixaban  5 mg Oral BID   cinacalcet  30 mg Oral Q M,W,F-HD   [START ON 08/24/2021] darbepoetin (ARANESP) injection - DIALYSIS  25 mcg Intravenous Q Mon-HD   DULoxetine  20 mg Oral Daily   feeding supplement (NEPRO CARB STEADY)  237 mL Oral Q24H   insulin aspart  0-15 Units Subcutaneous TID WC   insulin aspart  0-5 Units Subcutaneous QHS   mouth rinse  15 mL Mouth Rinse BID   methocarbamol  750 mg Oral TID   metoprolol tartrate  12.5 mg Oral BID   midodrine  15 mg Oral TID WC   multivitamin  1 tablet Oral QHS   pantoprazole  40 mg Oral BID   polyethylene glycol  17 g Oral Daily   senna  1 tablet Oral Daily    Antimicrobials: Anti-infectives (From admission, onward)    Start     Dose/Rate Route Frequency Ordered Stop   08/19/21 1615  ceFAZolin 1 g / gentamicin 80 mg in NS 500 mL surgical irrigation  Status:  Discontinued          As needed 08/19/21 1619 08/19/21 1648   08/19/21 0600  ceFAZolin (ANCEF) IVPB 3g/100 mL premix        3 g 200 mL/hr over 30 Minutes Intravenous On call to O.R. 08/18/21 2051 08/19/21 1610   08/06/21 1530  piperacillin-tazobactam (ZOSYN) IVPB 2.25 g        2.25 g 100 mL/hr over 30 Minutes  Intravenous Every 8 hours 08/06/21 1434 08/10/21 2347   07/16/21 2000  DAPTOmycin (CUBICIN) 500 mg in sodium chloride 0.9 % IVPB  Status:  Discontinued        6 mg/kg  87.4 kg (Adjusted) 120 mL/hr over 30 Minutes Intravenous Every 48 hours 07/15/21 1138 07/16/21 1043   07/16/21 1200  linezolid (ZYVOX) tablet 600 mg        600 mg Oral Every 12 hours 07/16/21 1101 07/21/21 2018   07/15/21 1000  ceFEPIme (MAXIPIME) 1 g in sodium chloride 0.9 % 100 mL IVPB  Status:  Discontinued        1 g 200 mL/hr over 30 Minutes Intravenous Every  24 hours 07/15/21 0913 07/16/21 1043   07/15/21 0900  cefTRIAXone (ROCEPHIN) 1 g in sodium chloride 0.9 % 100 mL IVPB  Status:  Discontinued        1 g 200 mL/hr over 30 Minutes Intravenous Every 24 hours 07/15/21 0752 07/15/21 0913   07/15/21 0500  DAPTOmycin (CUBICIN) 500 mg in sodium chloride 0.9 % IVPB  Status:  Discontinued        6 mg/kg  87.4 kg (Adjusted) 120 mL/hr over 30 Minutes Intravenous Every 48 hours 07/15/21 0404 07/15/21 1138   06/23/21 0930  DAPTOmycin (CUBICIN) 500 mg in sodium chloride 0.9 % IVPB        6 mg/kg  86.2 kg (Adjusted) 120 mL/hr over 30 Minutes Intravenous Every 48 hours 06/23/21 0831 06/28/21 0130   06/23/21 0915  DAPTOmycin (CUBICIN) 800 mg in sodium chloride 0.9 % IVPB  Status:  Discontinued        6 mg/kg  130.1 kg 132 mL/hr over 30 Minutes Intravenous Every 48 hours 06/23/21 0825 06/23/21 0831   06/22/21 2200  ceFEPIme (MAXIPIME) 1 g in sodium chloride 0.9 % 100 mL IVPB  Status:  Discontinued        1 g 200 mL/hr over 30 Minutes Intravenous Every 24 hours 06/22/21 0001 06/23/21 0825   06/22/21 0800  metroNIDAZOLE (FLAGYL) IVPB 500 mg  Status:  Discontinued        500 mg 100 mL/hr over 60 Minutes Intravenous Every 8 hours 06/22/21 0716 06/23/21 0825   06/21/21 2230  doxycycline (VIBRAMYCIN) 100 mg in sodium chloride 0.9 % 250 mL IVPB  Status:  Discontinued        100 mg 125 mL/hr over 120 Minutes Intravenous Every 12 hours  06/21/21 2223 06/23/21 0825   06/21/21 2215  ceFEPIme (MAXIPIME) 2 g in sodium chloride 0.9 % 100 mL IVPB        2 g 200 mL/hr over 30 Minutes Intravenous  Once 06/21/21 2208 06/21/21 2245   06/21/21 2215  metroNIDAZOLE (FLAGYL) IVPB 500 mg        500 mg 100 mL/hr over 60 Minutes Intravenous  Once 06/21/21 2208 06/21/21 2340       PRN meds: acetaminophen, dextromethorphan-guaiFENesin, diphenhydrAMINE, docusate sodium, HYDROmorphone (DILAUDID) injection, ipratropium-albuterol, ondansetron (ZOFRAN) IV, polyethylene glycol, promethazine (PHENERGAN) injection (IM or IVPB)   Objective: Vitals:   08/21/21 1706 08/22/21 0900  BP: 91/62 102/62  Pulse: 65 71  Resp: 18 18  Temp: 97.8 F (36.6 C) 98 F (36.7 C)  SpO2: 97% 98%    Intake/Output Summary (Last 24 hours) at 08/22/2021 1543 Last data filed at 08/22/2021 0908 Gross per 24 hour  Intake 720 ml  Output 2500 ml  Net -1780 ml    Filed Weights   08/21/21 0500 08/21/21 1328 08/21/21 1706  Weight: 123.4 kg 125 kg 122.5 kg   Weight change: 1.6 kg Body mass index is 44.94 kg/m.   Physical Exam: General exam: Patient is not in any distress.  Patient is awake and alert.   HEENT: Patient is pale.  No jaundice.   Lungs: Clear to auscultation bilaterally CVS: S1-S2, query heart murmur.   GI/Abd soft, distended, nontender, bowel sound present CNS: Alert, awake and oriented x3 Extremities: Chronic lymphedema.  Left inner thigh with wound VAC in place.  Data Review: I have personally reviewed the laboratory data and studies available.  Recent Labs  Lab 08/17/21 0721 08/19/21 0811 08/21/21 0634  WBC 9.8 9.7 10.5  HGB 11.5* 11.5*  11.8*  HCT 34.4* 34.8* 35.8*  MCV 89.8 91.1 91.3  PLT 210 228 212    Recent Labs  Lab 08/17/21 0721 08/19/21 0811 08/21/21 0634  NA 130* 133* 130*  K 4.4 3.5 4.1  CL 93* 95* 93*  CO2 21* 24 18*  GLUCOSE 120* 128* 146*  BUN 76* 46* 55*  CREATININE 7.41* 5.20* 6.46*  CALCIUM 8.1* 8.1* 7.2*   PHOS 4.8* 3.3 4.5     F/u labs ordered Unresulted Labs (From admission, onward)     Start     Ordered   08/23/21 0500  CBC  Once,   R       Question:  Specimen collection method  Answer:  Lab=Lab collect   08/18/21 1523   Signed and Held  Renal function panel  Once,   R       Question:  Specimen collection method  Answer:  Lab=Lab collect   Signed and Held   Signed and Held  CBC  Once,   R       Question:  Specimen collection method  Answer:  Lab=Lab collect   Signed and Held            Time spent: 35 minutes  Signed, Bonnell Public, MD [facility] 08/22/2021

## 2021-08-23 DIAGNOSIS — N186 End stage renal disease: Secondary | ICD-10-CM | POA: Diagnosis not present

## 2021-08-23 DIAGNOSIS — I50812 Chronic right heart failure: Secondary | ICD-10-CM | POA: Diagnosis not present

## 2021-08-23 DIAGNOSIS — L03116 Cellulitis of left lower limb: Secondary | ICD-10-CM | POA: Diagnosis not present

## 2021-08-23 DIAGNOSIS — I9589 Other hypotension: Secondary | ICD-10-CM | POA: Diagnosis not present

## 2021-08-23 LAB — CBC
HCT: 33.8 % — ABNORMAL LOW (ref 36.0–46.0)
Hemoglobin: 11 g/dL — ABNORMAL LOW (ref 12.0–15.0)
MCH: 29.6 pg (ref 26.0–34.0)
MCHC: 32.5 g/dL (ref 30.0–36.0)
MCV: 91.1 fL (ref 80.0–100.0)
Platelets: 259 10*3/uL (ref 150–400)
RBC: 3.71 MIL/uL — ABNORMAL LOW (ref 3.87–5.11)
RDW: 18.5 % — ABNORMAL HIGH (ref 11.5–15.5)
WBC: 11.1 10*3/uL — ABNORMAL HIGH (ref 4.0–10.5)
nRBC: 0.3 % — ABNORMAL HIGH (ref 0.0–0.2)

## 2021-08-23 LAB — GLUCOSE, CAPILLARY
Glucose-Capillary: 147 mg/dL — ABNORMAL HIGH (ref 70–99)
Glucose-Capillary: 126 mg/dL — ABNORMAL HIGH (ref 70–99)
Glucose-Capillary: 121 mg/dL — ABNORMAL HIGH (ref 70–99)
Glucose-Capillary: 165 mg/dL — ABNORMAL HIGH (ref 70–99)

## 2021-08-23 MED ORDER — DARBEPOETIN ALFA 25 MCG/0.42ML IJ SOSY: 25.0000 ug | PREFILLED_SYRINGE | INTRAMUSCULAR | Status: DC

## 2021-08-23 NOTE — Progress Notes (Signed)
PROGRESS NOTE  [name]  DOB: 1966-11-25  PCP: Noreene Larsson, NP GMW:[id number]  DOA: 06/21/2021  LOS: 27 days  Hospital Day: 60   Chief Complaint  Patient presents with   Hypotension    Brief narrative: [name] is a 55 y.o. female with PMH significant for ESRD on HD, morbid obesity, DM2, HTN, CHF, chronic lymphedema, COPD. Patient presented to the ED on 06/21/2021 with complaint of right hip and leg pain with evidence of cellulitis.   She was started on empiric antibiotics but continued to have issues with hypotension with evidence of cardiogenic shock from biventricular failure and pulmonary hypertension.  She was managed in the ICU with vasopressors and hemodialysis. See below for details.  08/22/2021: Patient seen.  Input from the plastic surgery team is appreciated.  On 08/19/2021, patient underwent "Excision of left thigh wound 5 x 10 x 1 cm skin, soft tissue and fat.  Placement of Myriad powder 1 gm and sheet 7 x 10 cm.  Placement of VAC".  Patient prefers to be discharged back on.  Transition of care note is not.  Patient is planned to be discharged on Wednesday (4 days time).  Wound care will be continued at home by home health.  Face-to-face/home health orders done.  08/23/2021: Patient seen.  No new changes.  Patient is awaiting discharge.  Case management input is appreciated.  Subjective: No new complaints. No fever or chills.  Assessment/Plan: Left medial thigh wound -Underwent I&D on 8/31 by plastic surgery.  Has a wound VAC in place.  Wound care per plastic surgery. 08/22/2021: Pursue disposition.  Cardiogenic shock -resolved Acute exacerbation of chronic right ventricular failure with pulmonary hypertension -patient required Vasopressor support in ICU.  Currently on midodrine.   -EF 55 to 60% on last echo from 7/15  08/22/2021: Patient has significant pulmonary hypertension.  Consider cardiology and pulmonary follow-up on discharge.  ESRD-HD-MWF Chronic  hypertension -Nephrology following.  Noted that inpatient dialysis have been challenging because of recurrent hypotension. Encouraged to try and prolong the duration of sitting in a recliner. -Continue midodrine. 08/23/2021: Defer to nephrology team.  Prolonged bleeding/oozing from AV fistula -8/25, patient underwent fistulogram by IR.  Did have tortuous AV fistula but no evidence of bleeding.  Continue apixaban.  If she starts to bleed, need to cut back on apixaban from 5 mg to 2.5 mg.     Infected RUE midline catheter -Midline removed and patient treated with linezolid for 5 days. Resolved.   Atypical atrial flutter -Patient underwent DCCV on 7/15 with conversion to normal sinus rhythm. -Continue amiodarone and metoprolol.  Heart rate stable. Continue Eliquis for anticoagulation.    Possible OSA/OHS Pulmonary hypertension -Started on CPAP during hospitalization -Continue CPAP with recommendation for outpatient sleep study   Fibromyalgia -Continue Cymbalta   Anemia of chronic disease -Hemoglobin stable. Recent Labs    07/29/21 0040 07/31/21 0037 08/04/21 0043 08/06/21 1344 08/10/21 0752 08/12/21 0843 08/17/21 0721 08/19/21 0811 08/21/21 0634 08/23/21 0036  HGB 9.9* 10.5* 11.0* 11.3* 10.5* 10.7* 11.5* 11.5* 11.8* 11.0*     Diabetes mellitus, type 2 -Hemoglobin A1c of 6.9%. Not on any antidiabetic at this time. -Blood sugar level is better in last 24 hours.  Continue to monitor. Recent Labs  Lab 08/22/21 1641 08/22/21 2025 08/23/21 0638 08/23/21 1145 08/23/21 1627  GLUCAP 145* 119* 147* 126* 121*     Morbid obesity  -Body mass index is 44.94 kg/m. Patient has been advised to make an attempt  to improve diet and exercise patterns to aid in weight loss.   Weakness PT/OT recommended SNF.     Pressure injury Right/posterior/proximal thigh. Not present on admission.   Mobility: PT eval obtained Code Status:   Code Status: Full Code  Nutritional status: Body  mass index is 44.94 kg/m. Nutrition Problem: Increased nutrient needs Etiology: wound healing Signs/Symptoms: estimated needs Diet:  Diet Order             Diet - low sodium heart healthy           Diet Carb Modified Fluid consistency: Thin; Room service appropriate? Yes  Diet effective now                  DVT prophylaxis: apixaban (ELIQUIS) tablet 5 mg    Antimicrobials: None Fluid: None Consultants: Nephrology Family Communication: None at bedside  Status is: Inpatient  Remains inpatient appropriate because -pending SNF  Dispo: The patient is from: Home              Anticipated d/c is to: SNF              Patient currently is medically stable to d/c.   Difficult to place patient No     Infusions:   sodium chloride     promethazine (PHENERGAN) injection (IM or IVPB) 12.5 mg (08/08/21 0901)    Scheduled Meds:  amiodarone  200 mg Oral Daily   apixaban  5 mg Oral BID   cinacalcet  30 mg Oral Q M,W,F-HD   [START ON 08/26/2021] darbepoetin (ARANESP) injection - DIALYSIS  25 mcg Intravenous Q Wed-HD   DULoxetine  20 mg Oral Daily   feeding supplement (NEPRO CARB STEADY)  237 mL Oral Q24H   insulin aspart  0-15 Units Subcutaneous TID WC   insulin aspart  0-5 Units Subcutaneous QHS   mouth rinse  15 mL Mouth Rinse BID   metoprolol tartrate  12.5 mg Oral BID   midodrine  15 mg Oral TID WC   multivitamin  1 tablet Oral QHS   pantoprazole  40 mg Oral BID   polyethylene glycol  17 g Oral Daily   senna  1 tablet Oral Daily    Antimicrobials: Anti-infectives (From admission, onward)    Start     Dose/Rate Route Frequency Ordered Stop   08/19/21 1615  ceFAZolin 1 g / gentamicin 80 mg in NS 500 mL surgical irrigation  Status:  Discontinued          As needed 08/19/21 1619 08/19/21 1648   08/19/21 0600  ceFAZolin (ANCEF) IVPB 3g/100 mL premix        3 g 200 mL/hr over 30 Minutes Intravenous On call to O.R. 08/18/21 2051 08/19/21 1610   08/06/21 1530   piperacillin-tazobactam (ZOSYN) IVPB 2.25 g        2.25 g 100 mL/hr over 30 Minutes Intravenous Every 8 hours 08/06/21 1434 08/10/21 2347   07/16/21 2000  DAPTOmycin (CUBICIN) 500 mg in sodium chloride 0.9 % IVPB  Status:  Discontinued        6 mg/kg  87.4 kg (Adjusted) 120 mL/hr over 30 Minutes Intravenous Every 48 hours 07/15/21 1138 07/16/21 1043   07/16/21 1200  linezolid (ZYVOX) tablet 600 mg        600 mg Oral Every 12 hours 07/16/21 1101 07/21/21 2018   07/15/21 1000  ceFEPIme (MAXIPIME) 1 g in sodium chloride 0.9 % 100 mL IVPB  Status:  Discontinued  1 g 200 mL/hr over 30 Minutes Intravenous Every 24 hours 07/15/21 0913 07/16/21 1043   07/15/21 0900  cefTRIAXone (ROCEPHIN) 1 g in sodium chloride 0.9 % 100 mL IVPB  Status:  Discontinued        1 g 200 mL/hr over 30 Minutes Intravenous Every 24 hours 07/15/21 0752 07/15/21 0913   07/15/21 0500  DAPTOmycin (CUBICIN) 500 mg in sodium chloride 0.9 % IVPB  Status:  Discontinued        6 mg/kg  87.4 kg (Adjusted) 120 mL/hr over 30 Minutes Intravenous Every 48 hours 07/15/21 0404 07/15/21 1138   06/23/21 0930  DAPTOmycin (CUBICIN) 500 mg in sodium chloride 0.9 % IVPB        6 mg/kg  86.2 kg (Adjusted) 120 mL/hr over 30 Minutes Intravenous Every 48 hours 06/23/21 0831 06/28/21 0130   06/23/21 0915  DAPTOmycin (CUBICIN) 800 mg in sodium chloride 0.9 % IVPB  Status:  Discontinued        6 mg/kg  130.1 kg 132 mL/hr over 30 Minutes Intravenous Every 48 hours 06/23/21 0825 06/23/21 0831   06/22/21 2200  ceFEPIme (MAXIPIME) 1 g in sodium chloride 0.9 % 100 mL IVPB  Status:  Discontinued        1 g 200 mL/hr over 30 Minutes Intravenous Every 24 hours 06/22/21 0001 06/23/21 0825   06/22/21 0800  metroNIDAZOLE (FLAGYL) IVPB 500 mg  Status:  Discontinued        500 mg 100 mL/hr over 60 Minutes Intravenous Every 8 hours 06/22/21 0716 06/23/21 0825   06/21/21 2230  doxycycline (VIBRAMYCIN) 100 mg in sodium chloride 0.9 % 250 mL IVPB   Status:  Discontinued        100 mg 125 mL/hr over 120 Minutes Intravenous Every 12 hours 06/21/21 2223 06/23/21 0825   06/21/21 2215  ceFEPIme (MAXIPIME) 2 g in sodium chloride 0.9 % 100 mL IVPB        2 g 200 mL/hr over 30 Minutes Intravenous  Once 06/21/21 2208 06/21/21 2245   06/21/21 2215  metroNIDAZOLE (FLAGYL) IVPB 500 mg        500 mg 100 mL/hr over 60 Minutes Intravenous  Once 06/21/21 2208 06/21/21 2340       PRN meds: acetaminophen, dextromethorphan-guaiFENesin, diphenhydrAMINE, docusate sodium, HYDROmorphone (DILAUDID) injection, ipratropium-albuterol, ondansetron (ZOFRAN) IV, polyethylene glycol, promethazine (PHENERGAN) injection (IM or IVPB)   Objective: Vitals:   08/23/21 0901 08/23/21 1832  BP: 100/61 (!) 98/59  Pulse: 64 63  Resp: 16 16  Temp: 98.4 F (36.9 C) 98.4 F (36.9 C)  SpO2: 93% 95%    Intake/Output Summary (Last 24 hours) at 08/23/2021 1834 Last data filed at 08/23/2021 0806 Gross per 24 hour  Intake 480 ml  Output 0 ml  Net 480 ml    Filed Weights   08/21/21 0500 08/21/21 1328 08/21/21 1706  Weight: 123.4 kg 125 kg 122.5 kg   Weight change:  Body mass index is 44.94 kg/m.   Physical Exam: General exam: Patient is not in any distress.  Patient is awake and alert.   HEENT: Patient is pale.  No jaundice.   Lungs: Clear to auscultation bilaterally CVS: S1-S2, query heart murmur.   GI/Abd soft, distended, nontender, bowel sound present CNS: Alert, awake and oriented x3 Extremities: Chronic lymphedema.  Left inner thigh with wound VAC in place.  Data Review: I have personally reviewed the laboratory data and studies available.  Recent Labs  Lab 08/17/21 [phone] 08/19/21 1245 08/21/21 8099  08/23/21 0036  WBC 9.8 9.7 10.5 11.1*  HGB 11.5* 11.5* 11.8* 11.0*  HCT 34.4* 34.8* 35.8* 33.8*  MCV 89.8 91.1 91.3 91.1  PLT 210 228 212 259    Recent Labs  Lab 08/17/21 0721 08/19/21 0811 08/21/21 0634  NA 130* 133* 130*  K 4.4 3.5 4.1  CL  93* 95* 93*  CO2 21* 24 18*  GLUCOSE 120* 128* 146*  BUN 76* 46* 55*  CREATININE 7.41* 5.20* 6.46*  CALCIUM 8.1* 8.1* 7.2*  PHOS 4.8* 3.3 4.5     F/u labs ordered Unresulted Labs (From admission, onward)     Start     Ordered   08/26/21 0500  CBC  Once,   R       Question:  Specimen collection method  Answer:  Lab=Lab collect   08/23/21 1523            Time spent: 25 minutes  Signed, Bonnell Public, MD [facility] 08/23/2021

## 2021-08-23 NOTE — TOC Progression Note (Addendum)
Transition of Care [facility]) - Progression Note    Patient Details  Name: [name] MRN: [id number] Date of Birth: 10/18/1966  Transition of [facility] CM/SW Contact  Bartholomew Crews, RN Phone Number: [phone] 08/23/2021, 11:36 AM  Clinical Narrative:     Spoke with representative at [employer] early this morning to follow up on home wound vac order - no order found.   Reached out to Dr. Marla Roe via phone to request orders. CM able to contact week day liaison for [facility] who offered to assist with order process. Electronic order to be sent to Dr. Marla Roe. Dr. Marla Roe made aware.   Home wound vac to be delivered to patient bedside pending order processing and insurance authorization.   Updated liaison at [facility] - wound vac dressing changes twice weekly. Following for potential start of care for Thursday or Saturday to coordinate with outpatient HD schedule MWF 11am.  TOC following.   1545 UPDATE: Order received for home wound vac and forwarded to [facility]. Spoke with patient at the bedside to discuss transition home. DME has been delivered to the home. [facility] is following for [facility]. Home wound vac to be delivered to the room pending insurance authorization. Patient will need ambulance transportation home.   Expected Discharge Plan: [place] Barriers to Discharge: Continued Medical Work up  Expected Discharge Plan and Services Expected Discharge Plan: [place] In-house Referral: Clinical Social Work Discharge Planning Services: CM Consult Post Acute Care Choice: [place] arrangements for the past 2 months: Single Family Home Expected Discharge Date: 08/21/21               DME Arranged: Bedside commode, Walker rolling, Hospital bed (Bariatric) DME Agency: [employer] Date DME Agency Contacted: 08/22/21 Time DME Agency Contacted: 1341 Representative spoke with at DME Agency: [place]: RN, PT, OT, Nurse's Aide, Social  Work [employer] Agency: [place] Date [place]: 08/22/21 Time [place]: [place] Representative spoke with at [place]: [place] ([place]) Interventions Food Insecurity Interventions: Intervention Not Indicated Financial Strain Interventions: Intervention Not Indicated Housing Interventions: Intervention Not Indicated Transportation Interventions: Intervention Not Indicated  Readmission Risk Interventions Readmission Risk Prevention Plan 04/30/2019  Transportation Screening Complete  Medication Review Press photographer) Complete  PCP or Specialist appointment within 3-5 days of discharge Complete  HRI or [place] Complete  SW Recovery Care/Counseling Consult Complete  [place] Not Applicable  Some recent data might be hidden

## 2021-08-23 NOTE — Progress Notes (Signed)
Patient ID: [name], female   DOB: 04/04/1966, 55 y.o.   MRN: [id number] S: open for discharge to home once supplies available--still pending. No acute events, no complaints O:BP 100/61   Pulse 64   Temp 98.4 F (36.9 C) (Oral)   Resp 16   Ht _0  (1.651 m)   Wt 122.5 kg   SpO2 93%   BMI 44.94 kg/m   Intake/Output Summary (Last 24 hours) at 08/23/2021 1142 Last data filed at 08/23/2021 0806 Gross per 24 hour  Intake 657 ml  Output 0 ml  Net 657 ml    Intake/Output: I/O last 3 completed shifts: In: 7614 [P.O.:1257] Out: 0   Intake/Output this shift:  Total I/O In: 120 [P.O.:120] Out: -  Weight change:  Gen:NAD CVS: RRR Resp: CTA Abd: +BS, soft, NT/Nd Ext: 1+ edema, wound vac on left thigh.  Recent Labs  Lab 08/17/21 0721 08/19/21 0811 08/21/21 0634  NA 130* 133* 130*  K 4.4 3.5 4.1  CL 93* 95* 93*  CO2 21* 24 18*  GLUCOSE 120* 128* 146*  BUN 76* 46* 55*  CREATININE 7.41* 5.20* 6.46*  ALBUMIN 3.6 3.7 3.4*  CALCIUM 8.1* 8.1* 7.2*  PHOS 4.8* 3.3 4.5   Liver Function Tests: Recent Labs  Lab 08/17/21 0721 08/19/21 0811 08/21/21 0634  ALBUMIN 3.6 3.7 3.4*   No results for input(s): LIPASE, AMYLASE in the last 168 hours. No results for input(s): AMMONIA in the last 168 hours. CBC: Recent Labs  Lab 08/17/21 0721 08/19/21 0811 08/21/21 0634 08/23/21 0036  WBC 9.8 9.7 10.5 11.1*  HGB 11.5* 11.5* 11.8* 11.0*  HCT 34.4* 34.8* 35.8* 33.8*  MCV 89.8 91.1 91.3 91.1  PLT 210 228 212 259   Cardiac Enzymes: No results for input(s): CKTOTAL, CKMB, CKMBINDEX, TROPONINI in the last 168 hours. CBG: Recent Labs  Lab 08/22/21 0645 08/22/21 1201 08/22/21 1641 08/22/21 2025 08/23/21 0638  GLUCAP 173* 154* 145* 119* 147*    Iron Studies: No results for input(s): IRON, TIBC, TRANSFERRIN, FERRITIN in the last 72 hours. Studies/Results: No results found.  amiodarone  200 mg Oral Daily   apixaban  5 mg Oral BID   cinacalcet  30 mg Oral Q M,W,F-HD    [START ON 08/26/2021] darbepoetin (ARANESP) injection - DIALYSIS  25 mcg Intravenous Q Wed-HD   DULoxetine  20 mg Oral Daily   feeding supplement (NEPRO CARB STEADY)  237 mL Oral Q24H   insulin aspart  0-15 Units Subcutaneous TID WC   insulin aspart  0-5 Units Subcutaneous QHS   mouth rinse  15 mL Mouth Rinse BID   metoprolol tartrate  12.5 mg Oral BID   midodrine  15 mg Oral TID WC   multivitamin  1 tablet Oral QHS   pantoprazole  40 mg Oral BID   polyethylene glycol  17 g Oral Daily   senna  1 tablet Oral Daily    BMET    Component Value Date/Time   NA 130 (L) 08/21/2021 0634   K 4.1 08/21/2021 0634   CL 93 (L) 08/21/2021 0634   CO2 18 (L) 08/21/2021 0634   GLUCOSE 146 (H) 08/21/2021 0634   BUN 55 (H) 08/21/2021 0634   CREATININE 6.46 (H) 08/21/2021 0634   CALCIUM 7.2 (L) 08/21/2021 0634   CALCIUM 8.5 09/06/2012 1056   GFRNONAA 7 (L) 08/21/2021 0634   GFRAA 5 (L) 07/04/2020 0732   GFRAA 5 (L) 07/04/2020 0732   CBC    Component Value Date/Time  WBC 11.1 (H) 08/23/2021 0036   RBC 3.71 (L) 08/23/2021 0036   HGB 11.0 (L) 08/23/2021 0036   HCT 33.8 (L) 08/23/2021 0036   PLT 259 08/23/2021 0036   MCV 91.1 08/23/2021 0036   MCH 29.6 08/23/2021 0036   MCHC 32.5 08/23/2021 0036   RDW 18.5 (H) 08/23/2021 0036   LYMPHSABS 1.3 08/12/2021 0843   MONOABS 0.5 08/12/2021 0843   EOSABS 0.1 08/12/2021 0843   BASOSABS 0.0 08/12/2021 0843    OUTPATIENT HD:  [facility], MWF, edw 135.5kg, 1K/2.5Ca, BFR 350, DFR 500, LAVF access, epo 1200 units every HD  Assessment/Plan:   Cardiogenic shock - due to acute on chronic RVF and pulmonary HTN.  improved ESRD - continue with HD on MWF schedule.  Left thigh wound - seen by plastic surgery and s/p surgical debridement and wound vac placement 08/19/21. Anemia of ESRD - stable Atypical atrial flutter - s/p DCCV on 07/03/21 to NSR, on amiodarone, metoprolol, and on eliquis. CKD-MBD - continue with home meds Hypotension - tolerating HD  with UF on midodrine.  Disposition - hopeful discharge soon to home once supplies available    Gean Quint, MD [facility]

## 2021-08-23 NOTE — Plan of Care (Signed)
  Problem: Activity: Goal: Risk for activity intolerance will decrease Outcome: Adequate for Discharge   Problem: Pain Managment: Goal: General experience of comfort will improve Outcome: Adequate for Discharge

## 2021-08-24 DIAGNOSIS — L03115 Cellulitis of right lower limb: Secondary | ICD-10-CM | POA: Diagnosis not present

## 2021-08-24 DIAGNOSIS — M79661 Pain in right lower leg: Secondary | ICD-10-CM | POA: Diagnosis not present

## 2021-08-24 DIAGNOSIS — L03116 Cellulitis of left lower limb: Secondary | ICD-10-CM | POA: Diagnosis not present

## 2021-08-24 DIAGNOSIS — I9589 Other hypotension: Secondary | ICD-10-CM | POA: Diagnosis not present

## 2021-08-24 LAB — GLUCOSE, CAPILLARY
Glucose-Capillary: 120 mg/dL — ABNORMAL HIGH (ref 70–99)
Glucose-Capillary: 121 mg/dL — ABNORMAL HIGH (ref 70–99)
Glucose-Capillary: 173 mg/dL — ABNORMAL HIGH (ref 70–99)
Glucose-Capillary: 112 mg/dL — ABNORMAL HIGH (ref 70–99)
Glucose-Capillary: 132 mg/dL — ABNORMAL HIGH (ref 70–99)

## 2021-08-24 MED ORDER — DARBEPOETIN ALFA 25 MCG/0.42ML IJ SOSY: 25.0000 ug | PREFILLED_SYRINGE | INTRAMUSCULAR | 0 refills | Status: DC

## 2021-08-24 NOTE — Progress Notes (Signed)
Patient discharged to home via PTAR with wound vac intact and AVS packet given to patient. IV removed and wound vac case sent with patient.

## 2021-08-24 NOTE — Progress Notes (Signed)
PT Cancellation Note  Patient Details Name: [name] MRN: [id number] DOB: 07-21-1966   Cancelled Treatment:    Reason Eval/Treat Not Completed: Patient at procedure or test/unavailable  Currently in HD;  Noted will likely dc home in the next few days, and appreciate TOC pulling details of transition home together;   Pt has not performed stand pivot transfers with acute PT in about a week; But noted when she is not fatigued, she is able to stand, turn, and sit to HD recliner with 2 person min assist; the last time she performed stand pivot transfer when fatigued, her knees buckled, and she required 2 person assist to prevent fall;   Given that, recommend [facility] lift, and that pt have the lift pad available to her to use for getting to her power chair after HD; especially if she has concerns re: feeling more weak post HD;   Will follow up later today as time allows;  Otherwise, will follow up for PT tomorrow;   Will continue to follow acutely;   Thank you,   Roney Marion, PT  Acute Rehabilitation Services Pager [phone] Office [phone]       [name] 08/24/2021, 11:04 AM

## 2021-08-24 NOTE — Plan of Care (Signed)
  Problem: Activity: Goal: Risk for activity intolerance will decrease Outcome: Adequate for Discharge   Problem: Pain Managment: Goal: General experience of comfort will improve Outcome: Adequate for Discharge

## 2021-08-24 NOTE — Discharge Summary (Signed)
Physician Discharge Summary  [name] FBP:[id number] DOB: 02-11-1966 DOA: 06/21/2021  PCP: Noreene Larsson, NP  Admit date: 06/21/2021 Discharge date: 08/24/2021  Admitted From: Home Discharge disposition: Home with home with services and DME   Code Status: Full Code   Discharge Diagnosis:   Principal Problem:   Hypotension Active Problems:   Lymphedema of lower extremity   Morbid obesity ([place])   Obesity hypoventilation syndrome ([place])   Cellulitis   ESRD on dialysis ([place])   Staphylococcus epidermidis bacteremia   Elevated troponin   Chronic right heart failure (HCC)   Type 2 diabetes mellitus with hyperglycemia (HCC)   Lymphedema   Cardiogenic shock (HCC)   Bilateral calf pain   Pressure injury of skin   Pulmonary hypertension ([place])    Chief Complaint  Patient presents with   Hypotension    Brief narrative: [name] is a 55 y.o. female with PMH significant for ESRD on HD, morbid obesity, DM2, HTN, CHF, chronic lymphedema, COPD. Patient presented to the ED on 06/21/2021 with complaint of right hip and leg pain with evidence of cellulitis.   She was started on empiric antibiotics but continued to have issues with hypotension with evidence of cardiogenic shock from biventricular failure and pulmonary hypertension.  She was managed in the ICU with vasopressors and hemodialysis. She has had prolonged hospitalization. For the last 1 week, she waited for IND to be done by plastic surgeon on 8/31. I&D was done as planned.  Currently has wound VAC.  Patient is recommended for SNF but she wants to go home with services.  Subjective: Patient was seen and examined this morning. Lying on bed.  Not in distress.   Pain controlled.  Wound VAC in place.  Patient states that she does not want SNF and wants to go home with services.  She states he has enough support at home.  Assessment/Plan: Left medial thigh wound -Underwent I&D on 8/31 by plastic surgery.  Has a wound  VAC in place.  Wound care per plastic surgery. -Ordered for home health RN for wound [facility] management.  However almost, is not able to provide iron service.  I discussed the case with plastic surgery team.  Patient will go home with home wound VAC.  She will follow-up with plastic surgery team on 08/25/2021 at 10 AM.  Cardiogenic shock -resolved Acute exacerbation of chronic right ventricular failure with pulmonary hypertension -patient required Vasopressor support in ICU.  Clinically improved.  Currently on midodrine.  Continue the same at discharge -EF 55 to 60% on last echo from 7/15   ESRD-HD-MWF Chronic hypertension -Nephrology following.  Continue midodrine.  Prolonged bleeding/oozing from AV fistula -8/25, patient underwent fistulogram by IR.  Did have tortuous AV fistula but no evidence of bleeding.  Continue apixaban.  If she starts to bleed, need to cut back on apixaban from 5 mg to 2.5 mg.     Infected RUE midline catheter -Midline removed and patient treated with linezolid for 5 days. Resolved.   Atypical atrial flutter -Patient underwent DCCV on 7/15 with conversion to normal sinus rhythm. -Continue amiodarone and metoprolol.  Heart rate stable. Continue Eliquis for anticoagulation.    Possible OSA/OHS Pulmonary hypertension -Started on CPAP during hospitalization -Continue CPAP with recommendation for outpatient sleep study   Fibromyalgia -Continue Cymbalta   Anemia of chronic disease -Hemoglobin stable. Recent Labs    07/29/21 0040 07/31/21 0037 08/04/21 8242 08/06/21 1344 08/10/21 3536 08/12/21 1443 08/17/21 1540 08/19/21 0867 08/21/21  8381 08/23/21 0036  HGB 9.9* 10.5* 11.0* 11.3* 10.5* 10.7* 11.5* 11.5* 11.8* 11.0*     Diabetes mellitus, type 2 -Hemoglobin A1c of 6.9%. Not on any antidiabetic at this time. -Blood sugar level controlled. Recent Labs  Lab 08/23/21 1627 08/23/21 2126 08/24/21 0640 08/24/21 0715 08/24/21 1214  GLUCAP 121* 165* 121*  120* 173*     Morbid obesity  -Body mass index is 44.94 kg/m. Patient has been advised to make an attempt to improve diet and exercise patterns to aid in weight loss.   Weakness PT/OT recommended SNF.  Patient wants to go home with services.   Allergies as of 08/24/2021       Reactions   Contrast Media [iodinated Diagnostic Agents] Anaphylaxis, Hives, Swelling, Other (See Comments)   Dye for cardiac cath. Tongue swells   Pneumococcal Vaccines Swelling, Other (See Comments)   Turns skin black, and bodily swelling   Vancomycin Nausea And Vomiting, Other (See Comments)   Infusion "made me feel like I was dying" had to be readmitted to hospital        Medication List     STOP taking these medications    ibuprofen 200 MG tablet Commonly known as: ADVIL       TAKE these medications    acetaminophen 325 MG tablet Commonly known as: TYLENOL Take 2 tablets (650 mg total) by mouth every 4 (four) hours as needed for mild pain (or Fever >/= 101).   amiodarone 200 MG tablet Commonly known as: PACERONE Take 1 tablet (200 mg total) by mouth daily.   apixaban 5 MG Tabs tablet Commonly known as: ELIQUIS Take 1 tablet (5 mg total) by mouth 2 (two) times daily.   calcium acetate 667 MG capsule Commonly known as: PHOSLO Take 1,334 mg by mouth 3 (three) times daily.   calcium carbonate 500 MG chewable tablet Commonly known as: TUMS - dosed in mg elemental calcium Chew 2 tablets by mouth 2 (two) times daily.   Darbepoetin Alfa 25 MCG/0.42ML Sosy injection Commonly known as: ARANESP Inject 0.42 mLs (25 mcg total) into the vein every Wednesday with hemodialysis. Start taking on: August 26, 2021   EX-LAX PO Take 2 tablets by mouth daily as needed (constipation).   lanthanum 500 MG chewable tablet Commonly known as: FOSRENOL Chew 1,000-1,500 mg by mouth 5 (five) times daily. 1500 mg by mouth three times a day with meals and 1000 mg by mouth twice a day with snacks    loperamide 2 MG capsule Commonly known as: IMODIUM Take 2-4 mg by mouth as needed for diarrhea or loose stools.   methocarbamol 750 MG tablet Commonly known as: ROBAXIN Take 1 tablet (750 mg total) by mouth 3 (three) times daily for 7 days.   metoprolol tartrate 25 MG tablet Commonly known as: LOPRESSOR Take 0.5 tablets (12.5 mg total) by mouth 2 (two) times daily.   midodrine 5 MG tablet Commonly known as: PROAMATINE Take 3 tablets (15 mg total) by mouth 3 (three) times daily with meals.   multivitamin Tabs tablet Take 1 tablet by mouth daily.   Oxycodone HCl 10 MG Tabs Take 1 tablet (10 mg total) by mouth 4 (four) times daily as needed (pain).   pantoprazole 40 MG tablet Commonly known as: PROTONIX Take 1 tablet (40 mg total) by mouth 2 (two) times daily.               Durable Medical Equipment  (From admission, onward)  Start     Ordered   08/21/21 1154  For home use only DME Bedside commode  Once       Comments: Bariatric  Question:  Patient needs a bedside commode to treat with the following condition  Answer:  Impaired mobility   08/21/21 1153   08/21/21 1154  For home use only DME Walker rolling  Once       Comments: Bariatric  Question Answer Comment  Walker: With [place]   Patient needs a walker to treat with the following condition Impaired mobility      08/21/21 1153   08/21/21 1139  For home use only DME Hospital bed  Once       Question Answer Comment  Length of Need Lifetime   Bed type Semi-electric   Hoyer Lift Yes   Trapeze Bar Yes   Support Surface: Gel Overlay      08/21/21 1138              Discharge Care Instructions  (From admission, onward)           Start     Ordered   08/24/21 0000  Discharge wound care:       Comments: Continue wound care as directed by the plastic surgery team.   08/24/21 1414   08/21/21 0000  Discharge wound care:       Comments: Use wound vac.  F/u with plastic surgery on  08/25/2021 at 10 am.   08/21/21 1432            Discharge Instructions:  Diet Recommendation:  Discharge Diet Orders (From admission, onward)     Start     Ordered   08/21/21 0000  Diet - low sodium heart healthy       Comments: Renal diet   08/21/21 1432              Follow with Primary MD Noreene Larsson, NP in 7 days   Get CBC/BMP checked in next visit within 1 week by PCP or SNF MD ( we routinely change or add medications that can affect your baseline labs and fluid status, therefore we recommend that you get the mentioned basic workup next visit with your PCP, your PCP may decide not to get them or add new tests based on their clinical decision)  On your next visit with your PCP, please Get Medicines reviewed and adjusted.  Please request your PCP  to go over all Hospital Tests and Procedure/Radiological results at the follow up, please get all Hospital records sent to your Prim MD by signing hospital release before you go home.  Activity: As tolerated with Full fall precautions use walker/cane & assistance as needed  For Heart failure patients - Check your Weight same time everyday, if you gain over 2 pounds, or you develop in leg swelling, experience more shortness of breath or chest pain, call your Primary MD immediately. Follow Cardiac Low Salt Diet and 1.5 lit/day fluid restriction.  If you have smoked or chewed Tobacco in the last 2 yrs please stop smoking, stop any regular Alcohol  and or any Recreational drug use.  If you experience worsening of your admission symptoms, develop shortness of breath, life threatening emergency, suicidal or homicidal thoughts you must seek medical attention immediately by calling 911 or calling your MD immediately  if symptoms less severe.  You Must read complete instructions/literature along with all the possible adverse reactions/side effects for all the Medicines you  take and that have been prescribed to you. Take any new Medicines  after you have completely understood and accpet all the possible adverse reactions/side effects.   Do not drive, operate heavy machinery, perform activities at heights, swimming or participation in water activities or provide baby sitting services if your were admitted for syncope or siezures until you have seen by Primary MD or a Neurologist and advised to do so again.  Do not drive when taking Pain medications.  Do not take more than prescribed Pain, Sleep and Anxiety Medications  Wear Seat belts while driving.   Please note You were cared for by a hospitalist during your hospital stay. If you have any questions about your discharge medications or the care you received while you were in the hospital after you are discharged, you can call the unit and asked to speak with the hospitalist on call if the hospitalist that took care of you is not available. Once you are discharged, your primary care physician will handle any further medical issues. Please note that NO REFILLS for any discharge medications will be authorized once you are discharged, as it is imperative that you return to your primary care physician (or establish a relationship with a primary care physician if you do not have one) for your aftercare needs so that they can reassess your need for medications and monitor your lab values.    Follow ups:    Follow-up Information     Noreene Larsson, NP Follow up.   Specialty: Nurse Practitioner Contact information: [address]  [place] [address] [place] [address] [place], PA-C Follow up.   Specialty: Emergency Medicine Why: 08/25/2021 at 10 am Contact information: [address] [place] [address] [phone]         Care, [facility] Follow up.   Why: the office will call to schedule home health visits Contact information: [place] [address] [phone]                  Wound care:   Pressure Injury 07/04/21 Thigh Posterior;Proximal;Right Stage 2 -  Partial thickness loss of dermis presenting as a shallow open injury with a red, pink wound bed without slough. (Active)  Date First Assessed/Time First Assessed: 07/04/21 1200   Location: (c) Thigh  Location Orientation: Posterior;Proximal;Right  Staging: Stage 2 -  Partial thickness loss of dermis presenting as a shallow open injury with a red, pink wound bed without s...    Assessments 07/04/2021 12:00 PM 08/23/2021  9:56 PM  Dressing Type Foam - Lift dressing to assess site every shift Foam - Lift dressing to assess site every shift  Dressing Clean;Dry;Intact;Changed Dry;Clean;Intact  Dressing Change Frequency PRN Daily  State of Healing -- Epithelialized  Site / Wound Assessment -- Clean;Dry;Pink  Margins Attached edges (approximated) --  Drainage Amount None None  Treatment -- Off loading     No Linked orders to display     Wound / Incision (Open or Dehisced) 08/04/21  Thigh Anterior;Left;Medial unstageable wound (Active)  Date First Assessed/Time First Assessed: 08/04/21 0930   Wound Type: (c)   Location: Thigh  Location Orientation: Anterior;Left;Medial  Wound Description (Comments): unstageable wound    Assessments 08/02/2021  6:10 PM 08/19/2021  7:30 AM  Dressing Changed Changed --  Dressing Change Frequency Twice a day Daily  Site / Wound Assessment -- Dressing in place / Unable  to assess  Margins Unattached edges (unapproximated) --     No Linked orders to display     Negative Pressure Wound Therapy Thigh Anterior;Left;Medial (Active)  Placement Date/Time: 08/19/21 1633   Wound Type: Incision (Closed wound)  Location: Thigh  Location Orientation: Anterior;Left;Medial    Assessments 08/19/2021  4:55 PM 08/23/2021 11:00 PM  Site / Wound Assessment Clean;Dry --  Peri-wound Assessment Intact --  Cycle Continuous --  Target Pressure (mmHg) 125 --  Instillation Volume -- 0 mL  Drainage Amount  None --  Output (mL) -- 0 mL     No Linked orders to display     Incision (Closed) 08/19/21 Leg Left (Active)  Date First Assessed/Time First Assessed: 08/19/21 1636   Location: Leg  Location Orientation: Left    Assessments 08/19/2021  4:55 PM 08/23/2021  9:56 PM  Dressing Type Negative pressure wound therapy Negative pressure wound therapy  Dressing Dry;Clean;Intact Clean;Intact;Dry  Site / Wound Assessment Dressing in place / Unable to assess Dressing in place / Unable to assess  Drainage Amount None None  Treatment -- Negative pressure wound therapy     No Linked orders to display    Discharge Exam:   Vitals:   08/24/21 1000 08/24/21 1030 08/24/21 1100 08/24/21 1127  BP: (!) 87/40 (!) 97/54 (!) 91/48 (!) 104/55  Pulse: 60 63 61 63  Resp:    18  Temp:    (!) 97.4 F (36.3 C)  TempSrc:    Axillary  SpO2:    98%  Weight:      Height:        Body mass index is 44.94 kg/m.  General exam: Pleasant, middle-aged African-American female.  Morbidly obese. skin: No rashes, lesions or ulcers. HEENT: Atraumatic, normocephalic, no obvious bleeding Lungs: Clear to auscultation bilaterally CVS: Regular rate and rhythm, no murmur GI/Abd soft, distended, nontender, bowel sound present CNS: Alert, awake and oriented x3 Psychiatry: Mood appropriate. Extremities: Chronic lymphedema.  Left inner thigh with wound VAC in place.  Time coordinating discharge: 35 minutes   The results of significant diagnostics from this hospitalization (including imaging, microbiology, ancillary and laboratory) are listed below for reference.    Procedures and Diagnostic Studies:   DG Ankle 2 Views Right  Result Date: 06/22/2021 CLINICAL DATA:  Right foot and ankle swelling/cellulitis. EXAM: RIGHT ANKLE - 2 VIEW COMPARISON:  CT, 06/19/2021 FINDINGS: No fracture or bone lesion. There is resorption a of the white cortical line along portions of the calcaneal tuberosity, more subtly along the inferior margin  of the medial malleolus, suggesting osteomyelitis. However, the previous CT scan showed no convincing osteomyelitis. Ankle joint is normally spaced and aligned. There are arterial vascular calcifications and diffuse soft tissue swelling. No soft tissue air. IMPRESSION: 1. Radiographic findings suggestive of osteomyelitis of the calcaneal tuberosity and more subtly of the inferior aspect of the medial malleolus, not evident, however, on the recent CT. 2. No fracture or bone lesion. 3. Diffuse soft tissue swelling.  No soft tissue air. Electronically Signed   By: Lajean Manes M.D.   On: 06/22/2021 08:44   DG Chest Port 1 View  Result Date: 06/21/2021 CLINICAL DATA:  Weakness, fatigue, and hypotension after recent admission for cellulitis. EXAM: PORTABLE CHEST 1 VIEW COMPARISON:  06/30/2020 FINDINGS: Cardiac enlargement. Prominent left pulmonary outflow tract. No airspace disease or consolidation in the lungs. No pleural effusions. No pneumothorax. Calcification of the aorta. No change since prior study. IMPRESSION: Cardiac enlargement.  No evidence of active  pulmonary disease. Electronically Signed   By: Lucienne Capers M.D.   On: 06/21/2021 22:36   DG Foot 2 Views Right  Result Date: 06/22/2021 CLINICAL DATA:  Swelling and cellulitis in the right foot and ankle. EXAM: RIGHT FOOT - 2 VIEW COMPARISON:  Right ankle 06/22/2021 FINDINGS: Diffuse soft tissue swelling throughout the right foot. Negative for fracture or dislocation. Mild spurring and sclerosis involving the calcaneus. Diffuse vascular calcifications. IMPRESSION: No acute bone abnormality to the right foot. Electronically Signed   By: Markus Daft M.D.   On: 06/22/2021 08:41   ECHOCARDIOGRAM COMPLETE  Result Date: 06/22/2021    ECHOCARDIOGRAM REPORT   Patient Name:   LACRISHA BIELICKI Date of Exam: 06/22/2021 Medical Rec #:  [id number]        Height:       65.0 in Accession #:    [id number]       Weight:       286.8 lb Date of Birth:  08-Dec-1966         BSA:          2.306 m Patient Age:    55 years         BP:           86/58 mmHg Patient Gender: F                HR:           87 bpm. Exam Location:  Inpatient Procedure: 2D Echo, Color Doppler, Cardiac Doppler and Intracardiac            Opacification Agent                          STAT ECHO Reported to: Dr Rudean Haskell on 06/22/2021 8:57:00 AM. Indications:    Elevated troponin  History:        Patient has prior history of Echocardiogram examinations, most                 recent 07/12/2017. CHF; Risk Factors:Hypertension, Diabetes and                 Dyslipidemia. ESRD. Cor pulmonale.  Sonographer:    Clayton Lefort RDCS (AE) Referring Phys: [id number] Julian Hy  Sonographer Comments: Technically difficult study due to poor echo windows and patient is morbidly obese. Image acquisition challenging due to patient body habitus. IMPRESSIONS  1. Left ventricular ejection fraction, by estimation, is 60 to 65%. The left ventricle has normal function. The left ventricle has no regional wall motion abnormalities. There is moderate concentric left ventricular hypertrophy. Left ventricular diastolic parameters are consistent with Grade I diastolic dysfunction (impaired relaxation). The left ventricular cavity is small and may be underfilled. There is no evidence of LVTO Obstruction.  2. Right ventricular systolic function is moderately to severely reduced. The right ventricular size is severely enlarged. There is severely elevated pulmonary artery systolic pressure. The estimated right ventricular systolic pressure is 32.9 mmHg.  3. Tricuspid valve regurgitation is moderate to severe and functional in nature.  4. Right atrial size was severely dilated.  5. The aortic valve is tricuspid. There is mild thickening of the aortic valve. Aortic valve regurgitation is not visualized. No aortic stenosis is present. Aortic valve mean gradient measures 4.0 mmHg.  6. The mitral valve is grossly normal. No evidence of mitral valve  regurgitation. The mean mitral valve gradient is 1.0 mmHg with average heart rate of 79 bpm.  7. The inferior vena cava is dilated in size with <50% respiratory variability, suggesting right atrial pressure of 15 mmHg. Comparison(s): A prior study was performed on 07/12/2017. Prior images reviewed side by side. RV function has worsened. FINDINGS  Left Ventricle: Left ventricular ejection fraction, by estimation, is 60 to 65%. The left ventricle has normal function. The left ventricle has no regional wall motion abnormalities. Definity contrast agent was given IV to delineate the left ventricular  endocardial borders. The left ventricular internal cavity size was small. There is moderate concentric left ventricular hypertrophy. Left ventricular diastolic parameters are consistent with Grade I diastolic dysfunction (impaired relaxation). Right Ventricle: The right ventricular size is severely enlarged. Right vetricular wall thickness was not assessed. Right ventricular systolic function is moderately reduced. There is severely elevated pulmonary artery systolic pressure. The tricuspid regurgitant velocity is 3.67 m/s, and with an assumed right atrial pressure of 15 mmHg, the estimated right ventricular systolic pressure is 24.4 mmHg. Left Atrium: Left atrial size was normal in size. Right Atrium: Right atrial size was severely dilated. Pericardium: There is no evidence of pericardial effusion. Mitral Valve: The mitral valve is grossly normal. Mild mitral annular calcification. No evidence of mitral valve regurgitation. MV peak gradient, 2.7 mmHg. The mean mitral valve gradient is 1.0 mmHg with average heart rate of 79 bpm. Tricuspid Valve: The tricuspid valve is normal in structure. Tricuspid valve regurgitation is moderate to severe. Aortic Valve: The aortic valve is tricuspid. There is mild thickening of the aortic valve. Aortic valve regurgitation is not visualized. No aortic stenosis is present. Aortic valve mean  gradient measures 4.0 mmHg. Aortic valve peak gradient measures 8.1 mmHg. Aortic valve area, by VTI measures 2.24 cm. Pulmonic Valve: Discordance between Color and Spectral Doppler. The pulmonic valve was not well visualized. Pulmonic valve regurgitation is mild. No evidence of pulmonic stenosis. Aorta: The aortic root and ascending aorta are structurally normal, with no evidence of dilitation. Venous: The inferior vena cava is dilated in size with less than 50% respiratory variability, suggesting right atrial pressure of 15 mmHg. IAS/Shunts: The atrial septum is grossly normal.  LEFT VENTRICLE PLAX 2D LVIDd:         3.30 cm  Diastology LVIDs:         1.80 cm  LV e' medial:    8.38 cm/s LV PW:         1.80 cm  LV E/e' medial:  9.8 LV IVS:        1.40 cm  LV e' lateral:   12.70 cm/s LVOT diam:     1.90 cm  LV E/e' lateral: 6.4 LV SV:         53 LV SV Index:   23 LVOT Area:     2.84 cm  RIGHT VENTRICLE             IVC RV Basal diam:  4.80 cm     IVC diam: 2.60 cm RV Mid diam:    4.60 cm RV S prime:     10.00 cm/s TAPSE (M-mode): 1.7 cm LEFT ATRIUM             Index       RIGHT ATRIUM           Index LA diam:        3.30 cm 1.43 cm/m  RA Area:     31.60 cm LA Vol (A2C):   78.7 ml 34.13 ml/m RA Volume:   129.00 ml 55.95 ml/m  LA Vol (A4C):   76.9 ml 33.35 ml/m LA Biplane Vol: 78.1 ml 33.87 ml/m  AORTIC VALVE AV Area (Vmax):    2.08 cm AV Area (Vmean):   1.95 cm AV Area (VTI):     2.24 cm AV Vmax:           142.00 cm/s AV Vmean:          96.200 cm/s AV VTI:            0.235 m AV Peak Grad:      8.1 mmHg AV Mean Grad:      4.0 mmHg LVOT Vmax:         104.00 cm/s LVOT Vmean:        66.000 cm/s LVOT VTI:          0.186 m LVOT/AV VTI ratio: 0.79  AORTA Ao Root diam: 3.10 cm Ao Asc diam:  3.40 cm MITRAL VALVE               TRICUSPID VALVE MV Area (PHT): 3.45 cm    TR Peak grad:   53.9 mmHg MV Area VTI:   2.29 cm    TR Vmax:        367.00 cm/s MV Peak grad:  2.7 mmHg MV Mean grad:  1.0 mmHg    SHUNTS MV Vmax:        0.82 m/s    Systemic VTI:  0.19 m MV Vmean:      54.4 cm/s   Systemic Diam: 1.90 cm MV Decel Time: 220 msec MV E velocity: 81.80 cm/s MV A velocity: 67.60 cm/s MV E/A ratio:  1.21 Rudean Haskell MD Electronically signed by Rudean Haskell MD Signature Date/Time: 06/22/2021/9:24:35 AM    Final    US Abdomen Limited RUQ (LIVER/GB)  Result Date: 06/22/2021 CLINICAL DATA:  Elevated liver function studies. EXAM: ULTRASOUND ABDOMEN LIMITED RIGHT UPPER QUADRANT COMPARISON:  CT 06/19/2021 FINDINGS: Gallbladder: Gallbladder is surgically absent. Common bile duct: Diameter: 3 mm, normal Liver: Coarsening of liver echotexture with nodular hepatic contour suggesting cirrhosis. No focal lesions identified. Small amount of free fluid consistent with ascites. Portal vein is patent on color Doppler imaging with normal direction of blood flow towards the liver. Other: None. IMPRESSION: Gallbladder surgically absent. Probable cirrhotic changes in the liver. Small amount of free fluid consistent with ascites. Electronically Signed   By: Lucienne Capers M.D.   On: 06/22/2021 02:56     Labs:   Basic Metabolic Panel: Recent Labs  Lab 08/19/21 0811 08/21/21 0634  NA 133* 130*  K 3.5 4.1  CL 95* 93*  CO2 24 18*  GLUCOSE 128* 146*  BUN 46* 55*  CREATININE 5.20* 6.46*  CALCIUM 8.1* 7.2*  PHOS 3.3 4.5    GFR Estimated Creatinine Clearance: 12.9 mL/min (A) (by C-G formula based on SCr of 6.46 mg/dL (H)). Liver Function Tests: Recent Labs  Lab 08/19/21 0811 08/21/21 0634  ALBUMIN 3.7 3.4*    No results for input(s): LIPASE, AMYLASE in the last 168 hours. No results for input(s): AMMONIA in the last 168 hours. Coagulation profile No results for input(s): INR, PROTIME in the last 168 hours.  CBC: Recent Labs  Lab 08/19/21 0811 08/21/21 0634 08/23/21 0036  WBC 9.7 10.5 11.1*  HGB 11.5* 11.8* 11.0*  HCT 34.8* 35.8* 33.8*  MCV 91.1 91.3 91.1  PLT 228 212 259    Cardiac Enzymes: No  results for input(s): CKTOTAL, CKMB, CKMBINDEX, TROPONINI in the last 168 hours. BNP: Invalid  input(s): POCBNP CBG: Recent Labs  Lab 08/23/21 1627 08/23/21 2126 08/24/21 0640 08/24/21 0715 08/24/21 1214  GLUCAP 121* 165* 121* 120* 173*    D-Dimer No results for input(s): DDIMER in the last 72 hours. Hgb A1c No results for input(s): HGBA1C in the last 72 hours. Lipid Profile No results for input(s): CHOL, HDL, LDLCALC, TRIG, CHOLHDL, LDLDIRECT in the last 72 hours. Thyroid function studies No results for input(s): TSH, T4TOTAL, T3FREE, THYROIDAB in the last 72 hours.  Invalid input(s): FREET3 Anemia work up No results for input(s): VITAMINB12, FOLATE, FERRITIN, TIBC, IRON, RETICCTPCT in the last 72 hours. Microbiology Recent Results (from the past 240 hour(s))  Surgical pcr screen     Status: None   Collection Time: 08/18/21  9:08 PM   Specimen: Nasal Mucosa; Nasal Swab  Result Value Ref Range Status   MRSA, PCR NEGATIVE NEGATIVE Final   Staphylococcus aureus NEGATIVE NEGATIVE Final    Comment: (NOTE) The Xpert SA Assay (FDA approved for NASAL specimens in patients 95 years of age and older), is one component of a comprehensive surveillance program. It is not intended to diagnose infection nor to guide or monitor treatment. Performed at [facility], [place] [address], [place], [place] [address]      Addendum: Patient will be discharged back on today.  Patient will follow-up with the plastic surgery team on 08/25/2021.  Continue wound care as directed by the plastic surgery team.  Follow-up with a primary care provider, plastic surgery team, cardiology team and pulmonary team within 1 week of discharge.  Patient will need outpatient sleep studies as patient will likely need home CPAP machine.   Signed: Bonnell Public  [facility] 08/24/2021, 2:22 PM

## 2021-08-24 NOTE — Progress Notes (Signed)
[facility] NEPHROLOGY PROGRESS NOTE  Assessment/ Plan:  OUTPATIENT HD:  [facility], MWF, edw 135.5kg, 1K/2.5Ca, BFR 350, DFR 500, LAVF access, epo 1200 units every HD  #Cardiogenic shock due to acute on chronic right ventricular failure and pulmonary hypertension.  Improved now.  # ESRD MWF schedule, status post dialysis today and tolerated well.  # Anemia of CKD: Hemoglobin at goal.  #CKD-MBD: Monitor calcium phosphorus level.  Hold Sensipar because of hypocalcemia.  Continue current medication.  #Hypotension/volume: On midodrine during HD, UF as tolerated  #Left thigh wound seen by plastic surgery and is status post surgical debridement and wound VAC placement on 08/19/2021.  Likely discharge to home today.  Subjective: Seen and examined bedside.  Patient saying that she is going home today.  Denies nausea vomiting chest pain shortness of breath. Objective Vital signs in last 24 hours: Vitals:   08/24/21 1000 08/24/21 1030 08/24/21 1100 08/24/21 1127  BP: (!) 87/40 (!) 97/54 (!) 91/48 (!) 104/55  Pulse: 60 63 61 63  Resp:    18  Temp:    (!) 97.4 F (36.3 C)  TempSrc:    Axillary  SpO2:    98%  Weight:      Height:       Weight change:   Intake/Output Summary (Last 24 hours) at 08/24/2021 1339 Last data filed at 08/24/2021 1119 Gross per 24 hour  Intake 594 ml  Output 3001 ml  Net -2407 ml       Labs: Basic Metabolic Panel: Recent Labs  Lab 08/19/21 0811 08/21/21 0634  NA 133* 130*  K 3.5 4.1  CL 95* 93*  CO2 24 18*  GLUCOSE 128* 146*  BUN 46* 55*  CREATININE 5.20* 6.46*  CALCIUM 8.1* 7.2*  PHOS 3.3 4.5   Liver Function Tests: Recent Labs  Lab 08/19/21 0811 08/21/21 0634  ALBUMIN 3.7 3.4*   No results for input(s): LIPASE, AMYLASE in the last 168 hours. No results for input(s): AMMONIA in the last 168 hours. CBC: Recent Labs  Lab 08/19/21 0811 08/21/21 0634 08/23/21 0036  WBC 9.7 10.5 11.1*  HGB 11.5* 11.8* 11.0*   HCT 34.8* 35.8* 33.8*  MCV 91.1 91.3 91.1  PLT 228 212 259   Cardiac Enzymes: No results for input(s): CKTOTAL, CKMB, CKMBINDEX, TROPONINI in the last 168 hours. CBG: Recent Labs  Lab 08/23/21 1627 08/23/21 2126 08/24/21 0640 08/24/21 0715 08/24/21 1214  GLUCAP 121* 165* 121* 120* 173*    Iron Studies: No results for input(s): IRON, TIBC, TRANSFERRIN, FERRITIN in the last 72 hours. Studies/Results: No results found.  Medications: Infusions:  sodium chloride     promethazine (PHENERGAN) injection (IM or IVPB) 12.5 mg (08/08/21 0901)    Scheduled Medications:  amiodarone  200 mg Oral Daily   apixaban  5 mg Oral BID   cinacalcet  30 mg Oral Q M,W,F-HD   [START ON 08/26/2021] darbepoetin (ARANESP) injection - DIALYSIS  25 mcg Intravenous Q Wed-HD   DULoxetine  20 mg Oral Daily   feeding supplement (NEPRO CARB STEADY)  237 mL Oral Q24H   insulin aspart  0-15 Units Subcutaneous TID WC   insulin aspart  0-5 Units Subcutaneous QHS   mouth rinse  15 mL Mouth Rinse BID   metoprolol tartrate  12.5 mg Oral BID   midodrine  15 mg Oral TID WC   multivitamin  1 tablet Oral QHS   pantoprazole  40 mg Oral BID   polyethylene glycol  17 g Oral  Daily   senna  1 tablet Oral Daily    have reviewed scheduled and prn medications.  Physical Exam: General:NAD, comfortable Heart:RRR, s1s2 nl Lungs:clear anteriorly, no crackle Abdomen:soft, Non-tender Extremities: Dressing applied and wound VAC on left leg. Dialysis Access: Left AV fistula in place  Damaria Vachon [facility] 08/24/2021,1:39 PM  LOS: 64 days

## 2021-08-24 NOTE — TOC Transition Note (Signed)
Transition of Care [facility]) - CM/SW Discharge Note   Patient Details  Name: [name] MRN: [id number] Date of Birth: 01/30/1966  Transition of Care [facility]) CM/SW Contact:  Tom-Johnson, Renea Ee, RN Phone Number: 08/24/2021, 2:21 PM   Clinical Narrative:    Spoke with patient at bedside about discharge transition. Home health PT/OT/RN/Aide/Social Worker referral made through [employer]. Initial referral with Alvis Lemmings cancelled because of the type of wound vac patient will be going home with. Correct wound vac in room and nurse to change at discharge. DME equipments already delivered at home. MD requested patient go home on CPAP. Zack with [employer] [phone]) called and he stated patient should have had a sleep study test done before insurance will approve CPAP for home use. Patient needs outpatient sleep study done and [employer] will follow up with CPAP. MD, nurse, and patient made aware. PTAR to transport at discharge. No further TOC needs at this time.  Final next level of care: [place] Barriers to Discharge: No Barriers Identified   Patient Goals and CMS Choice Patient states their goals for this hospitalization and ongoing recovery are:: To go home CMS Medicare.gov Compare Post Acute Care list provided to:: Patient Choice offered to / list presented to : Patient  Discharge Placement                       Discharge Plan and Services In-house Referral: Clinical Social Work Discharge Planning Services: CM Consult Post Acute Care Choice: Home Health          DME Arranged: Bedside commode, Hospital bed, Walker rolling DME Agency: [employer] Date DME Agency Contacted: 08/22/21 Time DME Agency Contacted: [id number] Representative spoke with at DME Agency: [place]: RN, PT, OT, Nurse's Aide, Social Work [employer] Agency: [place] Date [place]: 08/22/21 Time [place]: 7867 Representative spoke with at [place]:  [place] [facility] Interventions Food Insecurity Interventions: Intervention Not Indicated Financial Strain Interventions: Intervention Not Indicated Housing Interventions: Intervention Not Indicated Transportation Interventions: Intervention Not Indicated   Readmission Risk Interventions Readmission Risk Prevention Plan 04/30/2019  Transportation Screening Complete  Medication Review Press photographer) Complete  PCP or Specialist appointment within 3-5 days of discharge Complete  HRI or [place] Complete  SW Recovery Care/Counseling Consult Complete  [place] Not Applicable  Some recent data might be hidden

## 2021-08-24 NOTE — Plan of Care (Signed)
  Problem: Activity: Goal: Risk for activity intolerance will decrease Outcome: Progressing   Problem: Pain Managment: Goal: General experience of comfort will improve Outcome: Progressing

## 2021-08-24 NOTE — Progress Notes (Signed)
Orthopedic Tech Progress Note Patient Details:  [name] [birth year] [id number]  Ortho Devices Type of Ortho Device: Haematologist Ortho Device/Splint Location: BLE Ortho Device/Splint Interventions: Ordered, Application, Adjustment   Post Interventions Patient Tolerated: Well Instructions Provided: Care of device  [name] 08/24/2021, 2:39 PM

## 2021-08-24 NOTE — Progress Notes (Signed)
Patient will place herself on/off CPAP as needed.  RT will continue to monitor.

## 2021-08-24 NOTE — Consult Note (Signed)
[facility] Nurse Consult Note: Reason for Consult: NPWT device is not functioning. Patient is for discharge today.   Bedside RN A. Evans contacted this Probation officer via [employer] regarding malfunctioning NPWT device. [facility] is not familiar with this patient having a NPWT device; we were supporting care with a collagenase (Santyl) enzymatic debriding agent at last assessment on 08/04/21. Since that time, Plastic Surgery has been consulted and the medial thigh wound debrided by Dr. Marla Roe on 8/31.  A NPWT device was placed. The Plastic Surgery PA-C M. Scheeler has been in touch with the patient regarding care since then, see last note dated 08/21/21.  The [place] team is not on the [facility] campus at this time.  Bedside RN advised to contact Plastic Surgery for guidance on the care of this patient and her wound with regard to NPWT in preparation for discharge.  [place] nursing team will not follow, but will remain available to this patient, the nursing and medical teams.  Please re-consult if needed. Thanks, Maudie Flakes, MSN, RN, [place], Arther Abbott  Pager# [phone]

## 2021-08-24 NOTE — Plan of Care (Signed)
  Problem: Activity: Goal: Risk for activity intolerance will decrease Outcome: Progressing

## 2021-08-27 ENCOUNTER — Telehealth: Payer: Self-pay | Admitting: Internal Medicine

## 2021-08-27 NOTE — Telephone Encounter (Signed)
I spoke to [name] on the phone, mentioning that she may have been exposed to a patient with hepatitis B who was not on precautions. It appears by our record they were both in the hemodialysis unit (time overlap for 30 min) she did not have any exposure to the same equipment. I mentioned her exposure was minimal and that we still recommend to do testing and specifically to get hepatitis b vaccine since it does not appear that she is immune. I have coordinated with her HD center, [facility], to vaccinate. Her hep b surface ab test from 07/27/2021 reflects that she is not immune.  We will send notification letter and follow up phone call for further questions and review test results.  [name] for Infectious Diseases [phone]

## 2021-08-28 ENCOUNTER — Other Ambulatory Visit: Payer: Self-pay

## 2021-08-28 ENCOUNTER — Encounter (HOSPITAL_COMMUNITY): Payer: Self-pay | Admitting: Emergency Medicine

## 2021-08-28 ENCOUNTER — Telehealth: Payer: Self-pay | Admitting: Plastic Surgery

## 2021-08-28 ENCOUNTER — Emergency Department (HOSPITAL_COMMUNITY)
Admission: EM | Admit: 2021-08-28 | Discharge: 2021-08-28 | Disposition: A | Discharge status: Home / Self Care | Payer: 59 | Attending: Emergency Medicine | Admitting: Emergency Medicine

## 2021-08-28 DIAGNOSIS — Z8616 Personal history of COVID-19: Secondary | ICD-10-CM | POA: Diagnosis not present

## 2021-08-28 DIAGNOSIS — N186 End stage renal disease: Secondary | ICD-10-CM | POA: Insufficient documentation

## 2021-08-28 DIAGNOSIS — I5032 Chronic diastolic (congestive) heart failure: Secondary | ICD-10-CM | POA: Insufficient documentation

## 2021-08-28 DIAGNOSIS — E1122 Type 2 diabetes mellitus with diabetic chronic kidney disease: Secondary | ICD-10-CM | POA: Insufficient documentation

## 2021-08-28 DIAGNOSIS — Z794 Long term (current) use of insulin: Secondary | ICD-10-CM | POA: Insufficient documentation

## 2021-08-28 DIAGNOSIS — Z85850 Personal history of malignant neoplasm of thyroid: Secondary | ICD-10-CM | POA: Diagnosis not present

## 2021-08-28 DIAGNOSIS — X58XXXA Exposure to other specified factors, initial encounter: Secondary | ICD-10-CM | POA: Insufficient documentation

## 2021-08-28 DIAGNOSIS — I132 Hypertensive heart and chronic kidney disease with heart failure and with stage 5 chronic kidney disease, or end stage renal disease: Secondary | ICD-10-CM | POA: Insufficient documentation

## 2021-08-28 DIAGNOSIS — Z79899 Other long term (current) drug therapy: Secondary | ICD-10-CM | POA: Insufficient documentation

## 2021-08-28 DIAGNOSIS — Z7901 Long term (current) use of anticoagulants: Secondary | ICD-10-CM | POA: Insufficient documentation

## 2021-08-28 DIAGNOSIS — Z992 Dependence on renal dialysis: Secondary | ICD-10-CM | POA: Insufficient documentation

## 2021-08-28 DIAGNOSIS — J449 Chronic obstructive pulmonary disease, unspecified: Secondary | ICD-10-CM | POA: Diagnosis not present

## 2021-08-28 DIAGNOSIS — J45909 Unspecified asthma, uncomplicated: Secondary | ICD-10-CM | POA: Diagnosis not present

## 2021-08-28 DIAGNOSIS — S81802A Unspecified open wound, left lower leg, initial encounter: Secondary | ICD-10-CM | POA: Insufficient documentation

## 2021-08-28 LAB — COMPREHENSIVE METABOLIC PANEL
ALT: 8 U/L (ref 0–44)
AST: 15 U/L (ref 15–41)
Albumin: 3.5 g/dL (ref 3.5–5.0)
Alkaline Phosphatase: 199 U/L — ABNORMAL HIGH (ref 38–126)
Anion gap: 16 — ABNORMAL HIGH (ref 5–15)
BUN: 67 mg/dL — ABNORMAL HIGH (ref 6–20)
CO2: 23 mmol/L (ref 22–32)
Calcium: 7.5 mg/dL — ABNORMAL LOW (ref 8.9–10.3)
Chloride: 93 mmol/L — ABNORMAL LOW (ref 98–111)
Creatinine, Ser: 7.16 mg/dL — ABNORMAL HIGH (ref 0.44–1.00)
GFR, Estimated: 6 mL/min — ABNORMAL LOW (ref >60)
Glucose, Bld: 115 mg/dL — ABNORMAL HIGH (ref 70–99)
Potassium: 3.3 mmol/L — ABNORMAL LOW (ref 3.5–5.1)
Sodium: 132 mmol/L — ABNORMAL LOW (ref 135–145)
Total Bilirubin: 1.4 mg/dL — ABNORMAL HIGH (ref 0.3–1.2)
Total Protein: 7.9 g/dL (ref 6.5–8.1)

## 2021-08-28 LAB — LACTIC ACID, PLASMA: Lactic Acid, Venous: 1.1 mmol/L (ref 0.5–1.9)

## 2021-08-28 LAB — CBC WITH DIFFERENTIAL/PLATELET
Abs Immature Granulocytes: 0.04 10*3/uL (ref 0.00–0.07)
Basophils Absolute: 0 10*3/uL (ref 0.0–0.1)
Basophils Relative: 0 %
Eosinophils Absolute: 0.2 10*3/uL (ref 0.0–0.5)
Eosinophils Relative: 2 %
HCT: 33.4 % — ABNORMAL LOW (ref 36.0–46.0)
Hemoglobin: 11.1 g/dL — ABNORMAL LOW (ref 12.0–15.0)
Immature Granulocytes: 0 %
Lymphocytes Relative: 15 %
Lymphs Abs: 1.6 10*3/uL (ref 0.7–4.0)
MCH: 30.8 pg (ref 26.0–34.0)
MCHC: 33.2 g/dL (ref 30.0–36.0)
MCV: 92.8 fL (ref 80.0–100.0)
Monocytes Absolute: 0.5 10*3/uL (ref 0.1–1.0)
Monocytes Relative: 5 %
Neutro Abs: 7.7 10*3/uL (ref 1.7–7.7)
Neutrophils Relative %: 78 %
Platelets: 177 10*3/uL (ref 150–400)
RBC: 3.6 MIL/uL — ABNORMAL LOW (ref 3.87–5.11)
RDW: 19.3 % — ABNORMAL HIGH (ref 11.5–15.5)
WBC: 10.1 10*3/uL (ref 4.0–10.5)
nRBC: 0 % (ref 0.0–0.2)

## 2021-08-28 LAB — PROTIME-INR
INR: 1.3 — ABNORMAL HIGH (ref 0.8–1.2)
Prothrombin Time: 16.6 seconds — ABNORMAL HIGH (ref 11.4–15.2)

## 2021-08-28 LAB — APTT: aPTT: 34 seconds (ref 24–36)

## 2021-08-28 MED ORDER — OXYCODONE HCL 5 MG PO TABS: 10.0000 mg | ORAL_TABLET | Freq: Four times a day (QID) | ORAL | 0 refills | Status: DC | PRN

## 2021-08-28 MED ORDER — MORPHINE SULFATE (PF) 4 MG/ML IV SOLN
2.0000 mg | Freq: Once | INTRAVENOUS | Status: AC
Start: 2021-08-28 — End: 2021-08-28
  Administered 2021-08-28: 2 mg via INTRAVENOUS
  Filled 2021-08-28: qty 1

## 2021-08-28 MED ORDER — OXYCODONE HCL 5 MG PO TABS
10.0000 mg | ORAL_TABLET | Freq: Once | ORAL | Status: AC
Start: 2021-08-28 — End: 2021-08-28
  Administered 2021-08-28: 10 mg via ORAL
  Filled 2021-08-28: qty 2

## 2021-08-28 NOTE — ED Triage Notes (Signed)
Pt here by [facility] for possible wound infection per home health nurse; pt has left leg wound to left thigh; pt had debridement and wound vac placed at [facility] d/c x 3 days ago; v/s stable, pt missed dialysis appt today

## 2021-08-28 NOTE — Telephone Encounter (Signed)
Called patient to schedule post op in our office. Patient stated she was on her way back to the hospital and would call us back once she knows more about what is going on.

## 2021-08-28 NOTE — ED Provider Notes (Signed)
[facility] EMERGENCY DEPARTMENT Provider Note   CSN: [id number] Arrival date & time: 08/28/21  1513     History CC: Pain at wound   [name] is a 55 y.o. female with a history of end-stage renal disease on dialysis MWF, congestive heart failure, obesity, Emergency Department with complaint of left leg wound.  The patient was discharged from the hospital 4 days ago on September 5 after prolonged hospitalization for hypotension with cardiogenic shock, also noted to have developed a cellulitis for skin infection left medial calf that underwent irrigation and drainage on August 31 with plastic surgery Dr Marla Roe.  She was recommended for SNF but wanted to go home instead with home health/wound care nurse.  Patient reports his arrival home this week, she has been compliant with her dialysis.  She did miss her dialysis today because she was having worsening pain in her left lower leg.  She reports she has not changed her dressings since leaving the hospital; today she had the first visit by the wound care nurse.  She said the wound care nurse was concerned about the smell of the wound and advised her to come to the ED.  HPI     Past Medical History:  Diagnosis Date   Allergy    Phreesia 11/04/2020   Anemia, unspecified    Anxiety    Arthritis    Phreesia 11/04/2020   Asthma    as a child   Cancer ([place])    thyroid   Cellulitis    CHF (congestive heart failure) ([place])    Chiari malformation    s/p surgery   Chiari malformation    Chronic kidney disease    Phreesia 11/04/2020   Chronic kidney disease, stage 5 (HCC)    Chronic obstructive pulmonary disease, unspecified ([place])    Chronic pain    Complication of anesthesia 11/28/15   Resp arrest after  conscious  sedation   Compression of brain (HCC)    Constipation    COPD (chronic obstructive pulmonary disease) ([place])    Phreesia 11/04/2020   Coronary artery disease    40-50% mid LAD 04/29/09, Medical tx. (Dr. Gwenlyn Found)    Diabetes mellitus    Type II- reports being off all medication d/t it being controlled   Diabetes mellitus without complication ([place])    Phreesia 11/04/2020   Essential (primary) hypertension    Fibromyalgia    Fibromyalgia    History of blood transfusion    hemorrage duinrg pregancy   Hx of echocardiogram 10/2011   EF 55-60%   Hypercholesterolemia    Hyperlipidemia, unspecified    Hypertension    Hypertensive chronic kidney disease with stage 1 through stage 4 chronic kidney disease, or unspecified chronic kidney disease    Hypertensive chronic kidney disease with stage 5 chronic kidney disease or end stage renal disease (HCC)    Lymph edema    Lymphedema, not elsewhere classified    Morbid (severe) obesity due to excess calories (HCC)    Obesity hypoventilation syndrome ([place])    On home oxygen therapy    "2L; 24/7" (12/21/2018)   Peritonitis, unspecified ([place])    Pneumonia    in past   Pulmonary hypertension ([place])    Renal disorder    M/W/F [facility] Pt started dialysis in Dec.2016   S/P colonoscopy 05/26/2007   Dr. Laural Golden sigmoid diverticulosis random biopsies benign   S/P endoscopy 05/01/2009   Dr. Penelope Coop pill-induced esophageal ulcerations distal to midesophagus, 2 small ulcers  in the antrum of the stomach   Shortness of breath dyspnea    with any exertion or if heart rate  is irregular while on dialysis   Sleep apnea    reports that she no longer needs CPAP due to weight loss   Sleep apnea, unspecified    Type 2 diabetes mellitus with diabetic nephropathy ([place])    Type 2 diabetes mellitus with hyperglycemia [facility])     Patient Active Problem List   Diagnosis Date Noted   Pulmonary hypertension ([place])    Pressure injury of skin 07/05/2021   Hypotension 06/30/2021   Bilateral calf pain 06/30/2021   Cardiogenic shock ([place])    Cellulitis, leg 06/20/2021   COVID-19 02/03/2021   Enteritis, enteropathogenic E. coli 07/02/2020   Bacteremia 07/01/2020   Sepsis due  to undetermined organism ([place]) 06/30/2020   Anemia, unspecified    Essential (primary) hypertension    Hyperlipidemia, unspecified    Type 2 diabetes mellitus with hyperglycemia (HCC)    Chronic kidney disease, stage 5 ([place])    Chronic obstructive pulmonary disease, unspecified (HCC)    Fibromyalgia    Hypertensive chronic kidney disease with stage 1 through stage 4 chronic kidney disease, or unspecified chronic kidney disease    Hypertensive chronic kidney disease with stage 5 chronic kidney disease or end stage renal disease ([place])    Lymphedema    Morbid (severe) obesity due to excess calories ([place])    Peritonitis, unspecified ([place])    Type 2 diabetes mellitus with diabetic nephropathy (HCC)    Abdominal pain 04/20/2019   Peritonitis ([place]) 12/21/2018   Abdominal pain, LLQ 12/21/2018   Influenza B 12/21/2018   SOB (shortness of breath) 06/27/2018   Hypokalemia 06/27/2018   Chronic renal failure    Shock liver 01/24/2017   Chronic right heart failure ([place]) 01/24/2017   Elevated troponin    Flu-like symptoms 01/18/2017   Fever 01/18/2017   HCAP (healthcare-associated pneumonia) 12/31/2016   Numbness and tingling in right hand 08/13/2016   Sepsis ([place]) 08/11/2016   Staphylococcus epidermidis bacteremia 06/11/2016   ESRD (end stage renal disease) ([place]) 06/06/2016   Chronic cholecystitis 12/09/2015   Protein-calorie malnutrition, severe 11/29/2015   Respiratory arrest ([place]) 11/28/2015   Cellulitis 11/13/2015   Recurrent cellulitis of lower leg 11/13/2015   Chronic diastolic CHF (congestive heart failure) ([place]) 11/13/2015   Chronic respiratory failure with hypoxia ([place]) 11/13/2015   ESRD on dialysis ([place]) 11/13/2015   Anemia of chronic disease 11/13/2015   Rotator cuff syndrome of right shoulder 05/30/2013   Pain in joint, shoulder region 05/30/2013   Obesity hypoventilation syndrome ([place]) 09/04/2012   Hyperkalemia 11/11/2011   Leukopenia 11/11/2011   HTN (hypertension)  11/11/2011   HLD (hyperlipidemia) 11/11/2011   Transaminitis 11/11/2011   Diabetes mellitus ([place]) 10/27/2011   Morbid obesity ([place]) 10/27/2011   Lymphedema of lower extremity 10/04/2011    Past Surgical History:  Procedure Laterality Date   .Hemodialysis catheter Right 11/28/2015   A/V FISTULAGRAM N/A 07/26/2017   Procedure: A/V Fistulagram - Left Arm;  Surgeon: Serafina Mitchell, MD;  Location: [place] [facility];  Service: Cardiovascular;  Laterality: N/A;   ABDOMINAL HYSTERECTOMY     total hysterectomy   ADENOIDECTOMY     APPLICATION OF WOUND VAC Left 08/19/2021   Procedure: APPLICATION OF WOUND VAC;  Surgeon: Wallace Going, DO;  Location: [place];  Service: Plastics;  Laterality: Left;   AV FISTULA PLACEMENT Left 03/15/2016   Procedure: CREATION OF LEFT ARM  ARTERIOVENOUS (AV) FISTULA  ;  Surgeon: Rosetta Posner, MD;  Location: [place];  Service: Vascular;  Laterality: Left;   CAPD INSERTION N/A 08/30/2017   Procedure: LAPAROSCOPIC INSERTION CONTINUOUS AMBULATORY PERITONEAL DIALYSIS  (CAPD) CATHETER;  Surgeon: Clovis Riley, MD;  Location: [place];  Service: General;  Laterality: N/A;   CARDIOVERSION N/A 07/03/2021   Procedure: CARDIOVERSION;  Surgeon: Larey Dresser, MD;  [facility];  Service: Cardiovascular;  Laterality: N/A;   CESAREAN SECTION      x 2   CESAREAN SECTION N/A    Phreesia 11/04/2020   CRANIECTOMY SUBOCCIPITAL W/ CERVICAL LAMINECTOMY / CHIARI     FISTULA SUPERFICIALIZATION Left 05/10/2016   Procedure: Left Arm FISTULA SUPERFICIALIZATION;  Surgeon: Rosetta Posner, MD;  Location: [place];  Service: Vascular;  Laterality: Left;   I & D EXTREMITY Left 08/19/2021   Procedure: Debridement of left thigh wound;  Surgeon: Wallace Going, DO;  Location: [place];  Service: Plastics;  Laterality: Left;   IR GENERIC HISTORICAL  08/14/2016   IR REMOVAL TUN ACCESS W/ PORT W/O FL MOD SED 08/14/2016 Arne Cleveland, MD [facility]INTER[facility] RAD   IR SINUS/FIST TUBE CHK-NON GI  01/01/2019    IR THROMBECTOMY AV FISTULA W/THROMBOLYSIS INC/SHUNT/IMG LEFT Left 08/13/2021   IR US GUIDE BX ASP/DRAIN  12/28/2018   IR US GUIDE VASC ACCESS LEFT  08/13/2021   MINOR REMOVAL OF PERITONEAL DIALYSIS CATHETER N/A 04/26/2019   Procedure: REMOVAL OF INFECTED PERITONEAL DIALYSIS CATHETER;  Surgeon: Erroll Luna, MD;  Location: [place];  Service: General;  Laterality: N/A;   PERIPHERAL VASCULAR BALLOON ANGIOPLASTY Left 10/18/2019   Procedure: PERIPHERAL VASCULAR BALLOON ANGIOPLASTY;  Surgeon: Marty Heck, MD;  Location: [place] [facility];  Service: Cardiovascular;  Laterality: Left;  arm fistula   PERIPHERAL VASCULAR CATHETERIZATION Left 11/25/2016   Procedure: A/V Fistulagram;  Surgeon: Conrad Downey, MD;  Location: [place] [facility];  Service: Cardiovascular;  Laterality: Left;  arm   PORTACATH PLACEMENT  07/05/2012   Procedure: INSERTION PORT-A-CATH;  Surgeon: Donato Heinz, MD;  Location: [facility];  Service: General;  Laterality: Left;  subclavian   portacath removal     RIGHT HEART CATH N/A 08/04/2017   Procedure: RIGHT HEART CATH;  Surgeon: Jolaine Artist, MD;  Location: [place] [facility];  Service: Cardiovascular;  Laterality: N/A;   RIGHT HEART CATH N/A 07/02/2021   Procedure: RIGHT HEART CATH;  Surgeon: Larey Dresser, MD;  Location: [place] [facility];  Service: Cardiovascular;  Laterality: N/A;   TEE WITHOUT CARDIOVERSION N/A 07/03/2021   Procedure: TRANSESOPHAGEAL ECHOCARDIOGRAM (TEE);  Surgeon: Larey Dresser, MD;  Location: [facility];  Service: Cardiovascular;  Laterality: N/A;   THYROIDECTOMY, PARTIAL     TONSILLECTOMY       OB History     Gravida  4   Para  3   Term  2   Preterm  1   AB  1   Living         SAB  1   IAB      Ectopic      Multiple      Live Births              Family History  Problem Relation Age of Onset   Colon cancer Mother 50   Stroke Mother 70   Coronary artery disease Mother     Social History   Tobacco Use    Smoking status: Never  Smokeless tobacco: Never  Vaping Use   Vaping Use: Never used  Substance Use Topics   Alcohol use: No    Alcohol/week: 0.0 standard drinks   Drug use: No    Home Medications Prior to Admission medications   Medication Sig Start Date End Date Taking? Authorizing Provider  acetaminophen (TYLENOL) 325 MG tablet Take 2 tablets (650 mg total) by mouth every 4 (four) hours as needed for mild pain (or Fever >/= 101). 07/04/20  Yes Emokpae, Courage, MD  amiodarone (PACERONE) 200 MG tablet Take 1 tablet (200 mg total) by mouth daily. 08/21/21 09/20/21 Yes Dahal, Marlowe Aschoff, MD  apixaban (ELIQUIS) 5 MG TABS tablet Take 1 tablet (5 mg total) by mouth 2 (two) times daily. 08/21/21 09/20/21 Yes Dahal, Marlowe Aschoff, MD  calcium acetate (PHOSLO) 667 MG capsule Take 1,334 mg by mouth 3 (three) times daily. 05/27/21  Yes [provider]  Darbepoetin Alfa (ARANESP) 25 MCG/0.42ML SOSY injection Inject 0.42 mLs (25 mcg total) into the vein every Wednesday with hemodialysis. 08/26/21  Yes Bonnell Public, MD  lanthanum (FOSRENOL) 500 MG chewable tablet Chew 1,000-1,500 mg by mouth 5 (five) times daily. 1500 mg by mouth three times a day with meals and 1000 mg by mouth twice a day with snacks   Yes [provider]  loperamide (IMODIUM) 2 MG capsule Take 2-4 mg by mouth as needed for diarrhea or loose stools.    Yes [provider]  metoprolol tartrate (LOPRESSOR) 25 MG tablet Take 0.5 tablets (12.5 mg total) by mouth 2 (two) times daily. 08/21/21 09/20/21 Yes Dahal, Marlowe Aschoff, MD  midodrine (PROAMATINE) 5 MG tablet Take 3 tablets (15 mg total) by mouth 3 (three) times daily with meals. 08/21/21 09/20/21 Yes Dahal, Marlowe Aschoff, MD  multivitamin (RENA-VIT) TABS tablet Take 1 tablet by mouth daily. 05/30/18  Yes [provider]  oxyCODONE (ROXICODONE) 5 MG immediate release tablet Take 2 tablets (10 mg total) by mouth every 6 (six) hours as needed for up to 3 days for severe pain. 08/28/21  08/31/21 Yes Rayma Hegg, Carola Rhine, MD  pantoprazole (PROTONIX) 40 MG tablet Take 1 tablet (40 mg total) by mouth 2 (two) times daily. 08/21/21 09/20/21 Yes Dahal, Marlowe Aschoff, MD  Sennosides (EX-LAX PO) Take 2 tablets by mouth daily as needed (constipation).   Yes [provider]  calcium carbonate (TUMS - DOSED IN MG ELEMENTAL CALCIUM) 500 MG chewable tablet Chew 2 tablets by mouth 2 (two) times daily. Patient not taking: Reported on 08/28/2021    [provider]  Oxycodone HCl 10 MG TABS Take 1 tablet (10 mg total) by mouth 4 (four) times daily as needed (pain). Patient not taking: Reported on 08/28/2021 06/21/21   Orson Eva, MD  insulin lispro (HUMALOG) 100 UNIT/ML injection Inject 60 Units into the skin 2 (two) times daily.    03/08/12  [provider]    Allergies    Contrast media [iodinated diagnostic agents], Pneumococcal vaccines, and Vancomycin  Review of Systems   Review of Systems  Constitutional:  Negative for chills and fever.  Respiratory:  Negative for cough and shortness of breath.   Cardiovascular:  Negative for chest pain and palpitations.  Gastrointestinal:  Negative for abdominal pain and vomiting.  Genitourinary:  Negative for dysuria and hematuria.  Musculoskeletal:  Positive for myalgias. Negative for arthralgias.  Skin:  Positive for rash and wound.  Neurological:  Negative for syncope and headaches.  All other systems reviewed and are negative.  Physical Exam Updated Vital Signs  BP 100/69   Pulse 61   Temp 98 F (36.7 C) (Oral)   Resp 16   Ht _0  (1.651 m)   Wt 122.5 kg   SpO2 97%   BMI 44.93 kg/m   Physical Exam Constitutional:      General: She is not in acute distress.    Appearance: She is obese.  HENT:     Head: Normocephalic and atraumatic.  Eyes:     Conjunctiva/sclera: Conjunctivae normal.     Pupils: Pupils are equal, round, and reactive to light.  Cardiovascular:     Rate and Rhythm: Normal rate and regular rhythm.   Pulmonary:     Effort: Pulmonary effort is normal. No respiratory distress.  Abdominal:     General: There is no distension.     Tenderness: There is no abdominal tenderness.  Skin:    General: Skin is warm and dry.     Comments: Wound as photographed  Neurological:     General: No focal deficit present.     Mental Status: She is alert and oriented to person, place, and time. Mental status is at baseline.  Psychiatric:        Mood and Affect: Mood normal.        Behavior: Behavior normal.        ED Results / Procedures / Treatments   Labs (all labs ordered are listed, but only abnormal results are displayed) Labs Reviewed  COMPREHENSIVE METABOLIC PANEL - Abnormal; Notable for the following components:      Result Value   Sodium 132 (*)    Potassium 3.3 (*)    Chloride 93 (*)    Glucose, Bld 115 (*)    BUN 67 (*)    Creatinine, Ser 7.16 (*)    Calcium 7.5 (*)    Alkaline Phosphatase 199 (*)    Total Bilirubin 1.4 (*)    GFR, Estimated 6 (*)    Anion gap 16 (*)    All other components within normal limits  CBC WITH DIFFERENTIAL/PLATELET - Abnormal; Notable for the following components:   RBC 3.60 (*)    Hemoglobin 11.1 (*)    HCT 33.4 (*)    RDW 19.3 (*)    All other components within normal limits  PROTIME-INR - Abnormal; Notable for the following components:   Prothrombin Time 16.6 (*)    INR 1.3 (*)    All other components within normal limits  CULTURE, BLOOD (SINGLE)  LACTIC ACID, PLASMA  APTT    EKG EKG Interpretation  Date/Time:  Friday August 28 2021 16:52:58 EDT Ventricular Rate:  60 PR Interval:  172 QRS Duration: 106 QT Interval:  478 QTC Calculation: 478 R Axis:   134 Text Interpretation: Sinus rhythm Ventricular premature complex Right axis deviation Confirmed by Octaviano Glow [phone]) on 08/28/2021 5:06:28 PM  Radiology No results found.  Procedures Procedures   Medications Ordered in ED Medications  morphine 4 MG/ML injection 2  mg (2 mg Intravenous Given 08/28/21 1643)  oxyCODONE (Oxy IR/ROXICODONE) immediate release tablet 10 mg (10 mg Oral Given 08/28/21 1916)    ED Course  I have reviewed the triage vital signs and the nursing notes.  Pertinent labs & imaging results that were available during my care of the patient were reviewed by me and considered in my medical decision making (see chart for details).  Patient is here with complaint of wound pain and bad odor at home after discharge from the hospital.  She is  afebrile on arrival and does not display signs of sirs or sepsis.  Her white blood cell count is within normal limits, mildly down trended from when she left the hospital.  Her lactate is also normal.  The wound does not appear grossly purulent.  It is not clear to me whether the overlying skin changes are related to the wound care products, and it is clear that she needs more frequent dressing changes than what is been provided at home.  I consulted on the phone with the plastic surgeon, please see ED course below.  Otherwise clinically she does not demonstrate signs or symptoms of needing emergent dialysis.  Her potassium was 3.3.  She has not hypoxic or volume overloaded.  I think it be reasonable for her to follow-up for normal dialysis on Monday.  We also discussed pain control.  She does not feel that she was adequately treated for pain control at home.  Hospital discharge summary shows that she was discharged with 10 mg oxycodone tablets x 20 tablets, which she says is helpful but she only has 4 tablets left.  We can offer a short term extension.  I   Clinical Course as of 08/29/21 0054  Fri Aug 28, 2021  1649 I spoke to Dr Marla Roe (plastic surgeon) by phone who was able to review the patient's photos.  She states it is possible that the abnormal odor that I am noticing is related to the products over use for the patient's wound care.  The wound does not appear grossly infected or necrotic per review  of the images.  She does express some concern that the patient has not been using daily dressing changes with [place] Adaptik (or BID), and states that we may be seeing dried product from her old wound care.  Dr Marla Roe reports that the wound and consideration was fairly superficial, reports that the patient also had refused to wear the wound VAC and refused SNF as recommended for recovery.  It is unlikely that the patient would need further plastic surgery or operative intervention for this wound alone, however the surgeon defers to our judgment regarding whether the patient would need hospitalization for antibiotics or sepsis rule out.  Surgery team is available for consultation on Monday if the patient is in the hospital [MT]  1706 Lactic Acid, Venous: 1.1 [MT]  1706 WBC mildly downtrended from 5 days ago - no evidence of worsening infection. [MT]    Clinical Course User Index [MT] Andren Bethea, Carola Rhine, MD    Final Clinical Impression(s) / ED Diagnoses Final diagnoses:  Wound of left lower extremity, initial encounter    Rx / DC Orders ED Discharge Orders          Ordered    oxyCODONE (ROXICODONE) 5 MG immediate release tablet  Every 6 hours PRN        08/28/21 1826             Wyvonnia Dusky, MD 08/29/21 [phone]

## 2021-08-28 NOTE — Discharge Instructions (Addendum)
I spoke to the plastic surgeon, clarifies that you need to have your dressing change in your wound at least once a day.  You should apply Curlex pad or Adaptec with a thin dressing of KY jelly to the wound.    Your surgeon felt that the odor that you are smelling can be consistent with the products they were using in the hospital, as this was the first time that your dressings are being changed is leaving.  Your blood test did not show signs of a new or worsening infection.  However I do think it would be safe for you to call to schedule a follow-up appointment in the office this week if possible with the surgeons to have them take another look at your leg.  You can resume dialysis on Monday as regularly scheduled.

## 2021-09-02 LAB — CULTURE, BLOOD (SINGLE)
Culture: NO GROWTH
Special Requests: ADEQUATE

## 2021-09-04 ENCOUNTER — Telehealth: Payer: Self-pay | Admitting: Internal Medicine

## 2021-09-04 NOTE — Telephone Encounter (Signed)
9/03 exposure hep B at [facility]  9/07 some lady from [facility] called [place] about exposure. Told the [facility] staff about exposure, and advise the staff to test the patient, nothing about vaccination.   [facility] staff called me today and asked about vaccination administration. There was a note/call from [facility] to vaccinate patient, but the [facility] staff was unclear when that was sent (earlier than today).  I am asked about what type of vaccine and is it ok to give now   [facility] I spoke with Jeannetta Nap    I advised them even though the hep b IgG is out of the window to give, go ahead and give it. Also start the hep b vaccine active series again the first shot on the same day given with the hepb IgG (on separate buttock)   I spoke with Elta [place] of [facility] and have him advised patient to repeat hep b testing in 1-2 months

## 2021-09-07 ENCOUNTER — Telehealth: Payer: Self-pay | Admitting: Plastic Surgery

## 2021-09-07 NOTE — Telephone Encounter (Signed)
Sophie with [facility] is requesting guidance on wound vac instructions and care. Stated there was dead skin/ white blockages around the wound vac. Next change is 9/20. Please call to advise [phone]. Thank you.

## 2021-09-14 ENCOUNTER — Telehealth: Payer: Self-pay | Admitting: Nurse Practitioner

## 2021-09-14 NOTE — Telephone Encounter (Signed)
Apt scheduled.  

## 2021-09-14 NOTE — Telephone Encounter (Signed)
[name] is calling from [facility]  As the pt states she has green drainage from wound

## 2021-09-15 ENCOUNTER — Telehealth: Payer: Self-pay

## 2021-09-15 ENCOUNTER — Ambulatory Visit: Payer: 59 | Admitting: Nurse Practitioner

## 2021-09-15 ENCOUNTER — Encounter: Payer: Self-pay | Admitting: Nurse Practitioner

## 2021-09-15 NOTE — Telephone Encounter (Signed)
Patient coming in Thursday to see matt

## 2021-09-15 NOTE — Telephone Encounter (Signed)
Patient coming in Thursday to see [facility]

## 2021-09-15 NOTE — Telephone Encounter (Signed)
Patient calling surgeon who did the surgery she has appointment with them this week

## 2021-09-15 NOTE — Telephone Encounter (Signed)
I called patient per [name], PA, to schedule her for the earliest appointment we have that will match her transportation schedule.  I also let the patient know, per [name], PA, if her symptoms get worse, if she runs a fever, or if she needs pain medication, he recommends she go to the emergency room.

## 2021-09-15 NOTE — Telephone Encounter (Signed)
Sophie, Home Care Nurse with [facility], called to see if we can switch the patient to this Friday.  Sophie said that the patient has to arrange transportation with a company.  They thought that the company needed a 48-hour notice, but they found   She said that her wound started out having red blood as discharge, but the past few days, the patient's daughter noticed that the discharge is now greenish with not much blood.  The patient's daughter said that the patient is not running a fever but it's pretty painful for her.  Sophie would like to know if we can call in some pain medication for the patient.  Sophie also wants [place] to know that the patient will need to give her transportation company 48 hours notice

## 2021-09-15 NOTE — Telephone Encounter (Signed)
Sophie, Home Care Nurse with [facility], called to see if we can switch the patient to this Friday.  Sophie said that they discovered that the transportation company the patient uses needs 78 hours notice instead of 48 hours. She said that the patient's wound started out having red blood as discharge, but the past few days, the patient's daughter noticed that the discharge is now greenish with not much blood.  The patient's daughter said that the patient is not running a fever but it's pretty painful for her.  Sophie would like to know if we can call in some pain medication for the patient.  Please call.

## 2021-09-16 ENCOUNTER — Telehealth: Payer: Self-pay

## 2021-09-16 ENCOUNTER — Ambulatory Visit: Payer: 59

## 2021-09-16 ENCOUNTER — Other Ambulatory Visit: Payer: Self-pay

## 2021-09-16 NOTE — Telephone Encounter (Signed)
Called pt to start AWV visit. Pt states she will have to cancel visit because she is leaving to go to the hospital about her foot. Pt to r/s visit. Mjp,lpn

## 2021-09-17 ENCOUNTER — Ambulatory Visit: Payer: 59 | Admitting: Nurse Practitioner

## 2021-09-17 ENCOUNTER — Encounter (HOSPITAL_COMMUNITY): Payer: Self-pay | Admitting: *Deleted

## 2021-09-17 ENCOUNTER — Emergency Department (HOSPITAL_COMMUNITY): Payer: 59

## 2021-09-17 ENCOUNTER — Inpatient Hospital Stay (HOSPITAL_COMMUNITY)
Admission: EM | Admit: 2021-09-17 | Discharge: 2021-10-04 | DRG: 602 | Disposition: A | Discharge status: Hospice | Payer: 59 | Attending: Student | Admitting: Student

## 2021-09-17 ENCOUNTER — Other Ambulatory Visit: Payer: Self-pay

## 2021-09-17 ENCOUNTER — Ambulatory Visit: Payer: 59 | Admitting: Surgical

## 2021-09-17 DIAGNOSIS — E875 Hyperkalemia: Secondary | ICD-10-CM | POA: Diagnosis not present

## 2021-09-17 DIAGNOSIS — E8809 Other disorders of plasma-protein metabolism, not elsewhere classified: Secondary | ICD-10-CM | POA: Diagnosis not present

## 2021-09-17 DIAGNOSIS — Z7401 Bed confinement status: Secondary | ICD-10-CM

## 2021-09-17 DIAGNOSIS — K625 Hemorrhage of anus and rectum: Secondary | ICD-10-CM | POA: Diagnosis not present

## 2021-09-17 DIAGNOSIS — A419 Sepsis, unspecified organism: Secondary | ICD-10-CM

## 2021-09-17 DIAGNOSIS — Z6841 Body Mass Index (BMI) 40.0 and over, adult: Secondary | ICD-10-CM | POA: Diagnosis not present

## 2021-09-17 DIAGNOSIS — Z20822 Contact with and (suspected) exposure to covid-19: Secondary | ICD-10-CM | POA: Diagnosis present

## 2021-09-17 DIAGNOSIS — E1165 Type 2 diabetes mellitus with hyperglycemia: Secondary | ICD-10-CM | POA: Diagnosis not present

## 2021-09-17 DIAGNOSIS — Z79899 Other long term (current) drug therapy: Secondary | ICD-10-CM

## 2021-09-17 DIAGNOSIS — E785 Hyperlipidemia, unspecified: Secondary | ICD-10-CM | POA: Diagnosis present

## 2021-09-17 DIAGNOSIS — N2581 Secondary hyperparathyroidism of renal origin: Secondary | ICD-10-CM | POA: Diagnosis present

## 2021-09-17 DIAGNOSIS — E0800 Diabetes mellitus due to underlying condition with hyperosmolarity without nonketotic hyperglycemic-hyperosmolar coma (NKHHC): Secondary | ICD-10-CM | POA: Diagnosis not present

## 2021-09-17 DIAGNOSIS — Z7189 Other specified counseling: Secondary | ICD-10-CM | POA: Diagnosis not present

## 2021-09-17 DIAGNOSIS — I9589 Other hypotension: Secondary | ICD-10-CM | POA: Diagnosis present

## 2021-09-17 DIAGNOSIS — M898X9 Other specified disorders of bone, unspecified site: Secondary | ICD-10-CM | POA: Diagnosis present

## 2021-09-17 DIAGNOSIS — E782 Mixed hyperlipidemia: Secondary | ICD-10-CM | POA: Diagnosis present

## 2021-09-17 DIAGNOSIS — R7989 Other specified abnormal findings of blood chemistry: Secondary | ICD-10-CM | POA: Diagnosis not present

## 2021-09-17 DIAGNOSIS — Z7901 Long term (current) use of anticoagulants: Secondary | ICD-10-CM

## 2021-09-17 DIAGNOSIS — Z5189 Encounter for other specified aftercare: Secondary | ICD-10-CM

## 2021-09-17 DIAGNOSIS — I10 Essential (primary) hypertension: Secondary | ICD-10-CM | POA: Diagnosis not present

## 2021-09-17 DIAGNOSIS — L089 Local infection of the skin and subcutaneous tissue, unspecified: Secondary | ICD-10-CM | POA: Diagnosis present

## 2021-09-17 DIAGNOSIS — I482 Chronic atrial fibrillation, unspecified: Secondary | ICD-10-CM | POA: Diagnosis present

## 2021-09-17 DIAGNOSIS — R579 Shock, unspecified: Secondary | ICD-10-CM

## 2021-09-17 DIAGNOSIS — I878 Other specified disorders of veins: Secondary | ICD-10-CM | POA: Diagnosis present

## 2021-09-17 DIAGNOSIS — J449 Chronic obstructive pulmonary disease, unspecified: Secondary | ICD-10-CM | POA: Diagnosis present

## 2021-09-17 DIAGNOSIS — E46 Unspecified protein-calorie malnutrition: Secondary | ICD-10-CM

## 2021-09-17 DIAGNOSIS — Z8249 Family history of ischemic heart disease and other diseases of the circulatory system: Secondary | ICD-10-CM

## 2021-09-17 DIAGNOSIS — I2510 Atherosclerotic heart disease of native coronary artery without angina pectoris: Secondary | ICD-10-CM | POA: Diagnosis present

## 2021-09-17 DIAGNOSIS — G9341 Metabolic encephalopathy: Secondary | ICD-10-CM | POA: Diagnosis not present

## 2021-09-17 DIAGNOSIS — I953 Hypotension of hemodialysis: Secondary | ICD-10-CM | POA: Diagnosis not present

## 2021-09-17 DIAGNOSIS — A4152 Sepsis due to Pseudomonas: Secondary | ICD-10-CM | POA: Diagnosis not present

## 2021-09-17 DIAGNOSIS — N186 End stage renal disease: Secondary | ICD-10-CM | POA: Diagnosis present

## 2021-09-17 DIAGNOSIS — E871 Hypo-osmolality and hyponatremia: Secondary | ICD-10-CM | POA: Diagnosis present

## 2021-09-17 DIAGNOSIS — I5032 Chronic diastolic (congestive) heart failure: Secondary | ICD-10-CM | POA: Diagnosis present

## 2021-09-17 DIAGNOSIS — J810 Acute pulmonary edema: Secondary | ICD-10-CM | POA: Diagnosis not present

## 2021-09-17 DIAGNOSIS — I890 Lymphedema, not elsewhere classified: Secondary | ICD-10-CM | POA: Diagnosis not present

## 2021-09-17 DIAGNOSIS — R9431 Abnormal electrocardiogram [ECG] [EKG]: Secondary | ICD-10-CM | POA: Diagnosis present

## 2021-09-17 DIAGNOSIS — E662 Morbid (severe) obesity with alveolar hypoventilation: Secondary | ICD-10-CM | POA: Diagnosis present

## 2021-09-17 DIAGNOSIS — L89152 Pressure ulcer of sacral region, stage 2: Secondary | ICD-10-CM | POA: Diagnosis not present

## 2021-09-17 DIAGNOSIS — E876 Hypokalemia: Secondary | ICD-10-CM | POA: Diagnosis not present

## 2021-09-17 DIAGNOSIS — D631 Anemia in chronic kidney disease: Secondary | ICD-10-CM | POA: Diagnosis present

## 2021-09-17 DIAGNOSIS — E119 Type 2 diabetes mellitus without complications: Secondary | ICD-10-CM

## 2021-09-17 DIAGNOSIS — Z881 Allergy status to other antibiotic agents status: Secondary | ICD-10-CM

## 2021-09-17 DIAGNOSIS — I132 Hypertensive heart and chronic kidney disease with heart failure and with stage 5 chronic kidney disease, or end stage renal disease: Secondary | ICD-10-CM | POA: Diagnosis present

## 2021-09-17 DIAGNOSIS — E441 Mild protein-calorie malnutrition: Secondary | ICD-10-CM | POA: Diagnosis present

## 2021-09-17 DIAGNOSIS — Z90710 Acquired absence of both cervix and uterus: Secondary | ICD-10-CM

## 2021-09-17 DIAGNOSIS — F419 Anxiety disorder, unspecified: Secondary | ICD-10-CM | POA: Diagnosis present

## 2021-09-17 DIAGNOSIS — R6521 Severe sepsis with septic shock: Secondary | ICD-10-CM | POA: Diagnosis not present

## 2021-09-17 DIAGNOSIS — L03116 Cellulitis of left lower limb: Secondary | ICD-10-CM | POA: Diagnosis present

## 2021-09-17 DIAGNOSIS — S71102D Unspecified open wound, left thigh, subsequent encounter: Secondary | ICD-10-CM | POA: Diagnosis not present

## 2021-09-17 DIAGNOSIS — K644 Residual hemorrhoidal skin tags: Secondary | ICD-10-CM | POA: Diagnosis present

## 2021-09-17 DIAGNOSIS — M1990 Unspecified osteoarthritis, unspecified site: Secondary | ICD-10-CM | POA: Diagnosis present

## 2021-09-17 DIAGNOSIS — Z85850 Personal history of malignant neoplasm of thyroid: Secondary | ICD-10-CM

## 2021-09-17 DIAGNOSIS — Z823 Family history of stroke: Secondary | ICD-10-CM

## 2021-09-17 DIAGNOSIS — I5031 Acute diastolic (congestive) heart failure: Secondary | ICD-10-CM | POA: Diagnosis not present

## 2021-09-17 DIAGNOSIS — Z515 Encounter for palliative care: Secondary | ICD-10-CM | POA: Diagnosis not present

## 2021-09-17 DIAGNOSIS — T380X5A Adverse effect of glucocorticoids and synthetic analogues, initial encounter: Secondary | ICD-10-CM | POA: Diagnosis not present

## 2021-09-17 DIAGNOSIS — Z794 Long term (current) use of insulin: Secondary | ICD-10-CM

## 2021-09-17 DIAGNOSIS — Z992 Dependence on renal dialysis: Secondary | ICD-10-CM | POA: Diagnosis not present

## 2021-09-17 DIAGNOSIS — D6859 Other primary thrombophilia: Secondary | ICD-10-CM | POA: Diagnosis present

## 2021-09-17 DIAGNOSIS — R748 Abnormal levels of other serum enzymes: Secondary | ICD-10-CM | POA: Diagnosis present

## 2021-09-17 DIAGNOSIS — Z91041 Radiographic dye allergy status: Secondary | ICD-10-CM

## 2021-09-17 DIAGNOSIS — S71102A Unspecified open wound, left thigh, initial encounter: Secondary | ICD-10-CM | POA: Diagnosis present

## 2021-09-17 DIAGNOSIS — J811 Chronic pulmonary edema: Secondary | ICD-10-CM

## 2021-09-17 DIAGNOSIS — Z887 Allergy status to serum and vaccine status: Secondary | ICD-10-CM

## 2021-09-17 DIAGNOSIS — E890 Postprocedural hypothyroidism: Secondary | ICD-10-CM | POA: Diagnosis present

## 2021-09-17 DIAGNOSIS — M797 Fibromyalgia: Secondary | ICD-10-CM | POA: Diagnosis present

## 2021-09-17 DIAGNOSIS — Z9115 Patient's noncompliance with renal dialysis: Secondary | ICD-10-CM

## 2021-09-17 DIAGNOSIS — E1122 Type 2 diabetes mellitus with diabetic chronic kidney disease: Secondary | ICD-10-CM | POA: Diagnosis present

## 2021-09-17 DIAGNOSIS — Z66 Do not resuscitate: Secondary | ICD-10-CM | POA: Diagnosis not present

## 2021-09-17 DIAGNOSIS — L89892 Pressure ulcer of other site, stage 2: Secondary | ICD-10-CM | POA: Diagnosis not present

## 2021-09-17 DIAGNOSIS — Z800 Family history of malignant neoplasm of digestive organs: Secondary | ICD-10-CM

## 2021-09-17 DIAGNOSIS — E8881 Metabolic syndrome: Secondary | ICD-10-CM | POA: Diagnosis present

## 2021-09-17 DIAGNOSIS — E8779 Other fluid overload: Secondary | ICD-10-CM | POA: Diagnosis present

## 2021-09-17 LAB — COMPREHENSIVE METABOLIC PANEL
ALT: 16 U/L (ref 0–44)
AST: 15 U/L (ref 15–41)
Albumin: 3.3 g/dL — ABNORMAL LOW (ref 3.5–5.0)
Alkaline Phosphatase: 254 U/L — ABNORMAL HIGH (ref 38–126)
Anion gap: 18 — ABNORMAL HIGH (ref 5–15)
BUN: 105 mg/dL — ABNORMAL HIGH (ref 6–20)
CO2: 21 mmol/L — ABNORMAL LOW (ref 22–32)
Calcium: 8.2 mg/dL — ABNORMAL LOW (ref 8.9–10.3)
Chloride: 88 mmol/L — ABNORMAL LOW (ref 98–111)
Creatinine, Ser: 8.88 mg/dL — ABNORMAL HIGH (ref 0.44–1.00)
GFR, Estimated: 5 mL/min — ABNORMAL LOW (ref >60)
Glucose, Bld: 106 mg/dL — ABNORMAL HIGH (ref 70–99)
Potassium: 5.9 mmol/L — ABNORMAL HIGH (ref 3.5–5.1)
Sodium: 127 mmol/L — ABNORMAL LOW (ref 135–145)
Total Bilirubin: 1.2 mg/dL (ref 0.3–1.2)
Total Protein: 7.8 g/dL (ref 6.5–8.1)

## 2021-09-17 LAB — TYPE AND SCREEN
ABO/RH(D): B POS
Antibody Screen: NEGATIVE

## 2021-09-17 LAB — CBC WITH DIFFERENTIAL/PLATELET
Abs Immature Granulocytes: 0.1 10*3/uL — ABNORMAL HIGH (ref 0.00–0.07)
Basophils Absolute: 0 10*3/uL (ref 0.0–0.1)
Basophils Relative: 0 %
Eosinophils Absolute: 0.1 10*3/uL (ref 0.0–0.5)
Eosinophils Relative: 1 %
HCT: 30.9 % — ABNORMAL LOW (ref 36.0–46.0)
Hemoglobin: 10 g/dL — ABNORMAL LOW (ref 12.0–15.0)
Immature Granulocytes: 1 %
Lymphocytes Relative: 10 %
Lymphs Abs: 1 10*3/uL (ref 0.7–4.0)
MCH: 29.9 pg (ref 26.0–34.0)
MCHC: 32.4 g/dL (ref 30.0–36.0)
MCV: 92.5 fL (ref 80.0–100.0)
Monocytes Absolute: 0.5 10*3/uL (ref 0.1–1.0)
Monocytes Relative: 5 %
Neutro Abs: 8.7 10*3/uL — ABNORMAL HIGH (ref 1.7–7.7)
Neutrophils Relative %: 83 %
Platelets: 295 10*3/uL (ref 150–400)
RBC: 3.34 MIL/uL — ABNORMAL LOW (ref 3.87–5.11)
RDW: 19.8 % — ABNORMAL HIGH (ref 11.5–15.5)
WBC: 10.4 10*3/uL (ref 4.0–10.5)
nRBC: 0 % (ref 0.0–0.2)

## 2021-09-17 LAB — BRAIN NATRIURETIC PEPTIDE: B Natriuretic Peptide: 762 pg/mL — ABNORMAL HIGH (ref 0.0–100.0)

## 2021-09-17 LAB — RESP PANEL BY RT-PCR (FLU A&B, COVID) ARPGX2
Influenza A by PCR: NEGATIVE
Influenza B by PCR: NEGATIVE
SARS Coronavirus 2 by RT PCR: NEGATIVE

## 2021-09-17 LAB — LACTIC ACID, PLASMA
Lactic Acid, Venous: 1.3 mmol/L (ref 0.5–1.9)
Lactic Acid, Venous: 1.3 mmol/L (ref 0.5–1.9)

## 2021-09-17 LAB — ABO/RH: ABO/RH(D): B POS

## 2021-09-17 LAB — MAGNESIUM: Magnesium: 2.2 mg/dL (ref 1.7–2.4)

## 2021-09-17 MED ORDER — PROCHLORPERAZINE EDISYLATE 10 MG/2ML IJ SOLN
10.0000 mg | Freq: Four times a day (QID) | INTRAMUSCULAR | Status: DC | PRN
  Administered 2021-09-18 – 2021-10-01 (×11): 10 mg via INTRAVENOUS
  Filled 2021-09-17 (×13): qty 2

## 2021-09-17 MED ORDER — SODIUM BICARBONATE 8.4 % IV SOLN
INTRAVENOUS | Status: AC
  Administered 2021-09-17: 50 meq via INTRAVENOUS
  Filled 2021-09-17: qty 50

## 2021-09-17 MED ORDER — HYDROMORPHONE HCL 1 MG/ML IJ SOLN
0.5000 mg | INTRAMUSCULAR | Status: DC | PRN
Start: 2021-09-17 — End: 2021-10-04
  Administered 2021-09-17 – 2021-10-04 (×44): 0.5 mg via INTRAVENOUS
  Filled 2021-09-17 (×44): qty 0.5

## 2021-09-17 MED ORDER — SODIUM BICARBONATE 8.4 % IV SOLN: 50.0000 meq | Freq: Once | INTRAVENOUS | Status: DC

## 2021-09-17 MED ORDER — SODIUM ZIRCONIUM CYCLOSILICATE 10 G PO PACK
10.0000 g | PACK | Freq: Every day | ORAL | Status: DC
  Administered 2021-09-17: 10 g via ORAL
  Filled 2021-09-17: qty 2

## 2021-09-17 MED ORDER — HYDROMORPHONE HCL 1 MG/ML IJ SOLN
0.5000 mg | Freq: Once | INTRAMUSCULAR | Status: AC
  Administered 2021-09-17: 0.5 mg via INTRAVENOUS
  Filled 2021-09-17: qty 1

## 2021-09-17 MED ORDER — MORPHINE SULFATE (PF) 4 MG/ML IV SOLN
4.0000 mg | Freq: Once | INTRAVENOUS | Status: AC
Start: 2021-09-17 — End: 2021-09-17
  Administered 2021-09-17: 4 mg via INTRAVENOUS

## 2021-09-17 MED ORDER — HEPARIN SODIUM (PORCINE) 5000 UNIT/ML IJ SOLN
5000.0000 [IU] | Freq: Three times a day (TID) | INTRAMUSCULAR | Status: DC
  Administered 2021-09-17: 5000 [IU] via SUBCUTANEOUS
  Filled 2021-09-17: qty 1

## 2021-09-17 MED ORDER — LIDOCAINE-EPINEPHRINE (PF) 2 %-1:200000 IJ SOLN
10.0000 mL | Freq: Once | INTRAMUSCULAR | Status: AC
  Administered 2021-09-17: 10 mL via INTRADERMAL

## 2021-09-17 MED ORDER — ONDANSETRON HCL 4 MG/2ML IJ SOLN
4.0000 mg | Freq: Four times a day (QID) | INTRAMUSCULAR | Status: DC | PRN
  Administered 2021-09-17: 4 mg via INTRAVENOUS
  Filled 2021-09-17: qty 2

## 2021-09-17 MED ORDER — SODIUM BICARBONATE 8.4 % IV SOLN: 50.0000 meq | Freq: Once | INTRAVENOUS | Status: AC

## 2021-09-17 MED ORDER — ONDANSETRON HCL 4 MG/2ML IJ SOLN
4.0000 mg | Freq: Once | INTRAMUSCULAR | Status: AC
  Administered 2021-09-17: 4 mg via INTRAVENOUS
  Filled 2021-09-17: qty 2

## 2021-09-17 MED ORDER — CHLORHEXIDINE GLUCONATE CLOTH 2 % EX PADS
6.0000 | MEDICATED_PAD | Freq: Every day | CUTANEOUS | Status: DC
  Administered 2021-09-18 – 2021-09-22 (×4): 6 via TOPICAL

## 2021-09-17 NOTE — ED Notes (Signed)
Patient transported to X-ray 

## 2021-09-17 NOTE — ED Notes (Signed)
One attempted at IV unsuccessful. Another RN to attempt. EDP arare

## 2021-09-17 NOTE — ED Notes (Signed)
Per Gwyndolyn Saxon, [place] when wound vaccuum was removed by PA patient's ulceration burst with bleeding. Dr. Gilford Raid and Santiago Glad, PA at bedside to assess. Patient's wound applied with quick clot and gauze with pressure being held for 10 minutes now. Santiago Glad PA to suture wound at this time.

## 2021-09-17 NOTE — ED Provider Notes (Signed)
[facility] Provider Note   CSN: [id number] Arrival date & time: 09/17/21  1229     History Chief Complaint  Patient presents with   Wound Check    [name] is a 55 y.o. female.  HPI  Patient with extensive medical history including end-stage renal disease currently on dialysis Monday Wednesday Friday, obesity, type 2 diabetes, hypertension, CHF, chronic lymphedema, COPD presents to the emergency department with chief complaint of worsening leg infection and missed dialysis treatment.  Patient states since she was discharged from the hospital on 09/05 the wound on her leg has been getting worse, states she is having worsening pain and worsening discharge from the area, states that the pain is constant, does not radiate, she has no associated paresthesia or weakness in that leg, she denies worsening redness around the area, but she does endorse more purulent discharge and a foul smelling odor.  Patient states that her wound care nurse saw the wound and she is concerned that it is more infected and was recommend that she comes emergency department.   she does not endorse systemic fevers or chills, denies worsening orthopnea or worsening pedal edema.  She has no other complaints.  She also notes that she has not been at her dialysis treatment since Friday, states she had missed Monday and Wednesday because she just feels weak and is unable to go.    At the reviewing patient's chart patient had a extensive stay in the hospital due to septic shock and CHF exacerbation  she underwent debridement on 08/31 by Dr. Marla Roe  and later was placed on a wound VAC.  She was suppose to  go to a SNF but patient refused this.  She has been seen in the emergency department for similar presentation on 09/09, case was discussed with Dr. Marla Roe recommend patient needed more frequent dressing changes, did not feel patient needed further debridement..    Past Medical History:   Diagnosis Date   Allergy    Phreesia 11/04/2020   Anemia, unspecified    Anxiety    Arthritis    Phreesia 11/04/2020   Asthma    as a child   Cancer ([place])    thyroid   Cellulitis    CHF (congestive heart failure) ([place])    Chiari malformation    s/p surgery   Chiari malformation    Chronic kidney disease    Phreesia 11/04/2020   Chronic kidney disease, stage 5 (HCC)    Chronic obstructive pulmonary disease, unspecified ([place])    Chronic pain    Complication of anesthesia 11/28/15   Resp arrest after  conscious  sedation   Compression of brain (HCC)    Constipation    COPD (chronic obstructive pulmonary disease) ([place])    Phreesia 11/04/2020   Coronary artery disease    40-50% mid LAD 04/29/09, Medical tx. (Dr. Gwenlyn Found)   Diabetes mellitus    Type II- reports being off all medication d/t it being controlled   Diabetes mellitus without complication ([place])    Phreesia 11/04/2020   Essential (primary) hypertension    Fibromyalgia    Fibromyalgia    History of blood transfusion    hemorrage duinrg pregancy   Hx of echocardiogram 10/2011   EF 55-60%   Hypercholesterolemia    Hyperlipidemia, unspecified    Hypertension    Hypertensive chronic kidney disease with stage 1 through stage 4 chronic kidney disease, or unspecified chronic kidney disease    Hypertensive chronic kidney  disease with stage 5 chronic kidney disease or end stage renal disease (HCC)    Lymph edema    Lymphedema, not elsewhere classified    Morbid (severe) obesity due to excess calories ([place])    Obesity hypoventilation syndrome ([place])    On home oxygen therapy    "2L; 24/7" (12/21/2018)   Peritonitis, unspecified ([place])    Pneumonia    in past   Pulmonary hypertension ([place])    Renal disorder    M/W/F [facility] Pt started dialysis in Dec.2016   S/P colonoscopy 05/26/2007   Dr. Laural Golden sigmoid diverticulosis random biopsies benign   S/P endoscopy 05/01/2009   Dr. Penelope Coop pill-induced esophageal  ulcerations distal to midesophagus, 2 small ulcers in the antrum of the stomach   Shortness of breath dyspnea    with any exertion or if heart rate  is irregular while on dialysis   Sleep apnea    reports that she no longer needs CPAP due to weight loss   Sleep apnea, unspecified    Type 2 diabetes mellitus with diabetic nephropathy ([place])    Type 2 diabetes mellitus with hyperglycemia [facility])     Patient Active Problem List   Diagnosis Date Noted   Open wound of left thigh 09/17/2021   Pulmonary hypertension ([place])    Pressure injury of skin 07/05/2021   Hypotension 06/30/2021   Bilateral calf pain 06/30/2021   Cardiogenic shock ([place])    Cellulitis, leg 06/20/2021   COVID-19 02/03/2021   Enteritis, enteropathogenic E. coli 07/02/2020   Bacteremia 07/01/2020   Sepsis due to undetermined organism ([place]) 06/30/2020   Anemia, unspecified    Essential (primary) hypertension    Hyperlipidemia, unspecified    Type 2 diabetes mellitus with hyperglycemia (HCC)    Chronic kidney disease, stage 5 ([place])    Chronic obstructive pulmonary disease, unspecified (HCC)    Fibromyalgia    Hypertensive chronic kidney disease with stage 1 through stage 4 chronic kidney disease, or unspecified chronic kidney disease    Hypertensive chronic kidney disease with stage 5 chronic kidney disease or end stage renal disease ([place])    Lymphedema    Morbid (severe) obesity due to excess calories ([place])    Peritonitis, unspecified ([place])    Type 2 diabetes mellitus with diabetic nephropathy ([place])    Abdominal pain 04/20/2019   Peritonitis ([place]) 12/21/2018   Abdominal pain, LLQ 12/21/2018   Influenza B 12/21/2018   SOB (shortness of breath) 06/27/2018   Hypokalemia 06/27/2018   Chronic renal failure    Shock liver 01/24/2017   Chronic right heart failure ([place]) 01/24/2017   Elevated troponin    Flu-like symptoms 01/18/2017   Fever 01/18/2017   HCAP (healthcare-associated pneumonia) 12/31/2016   Numbness and  tingling in right hand 08/13/2016   Sepsis ([place]) 08/11/2016   Staphylococcus epidermidis bacteremia 06/11/2016   ESRD (end stage renal disease) ([place]) 06/06/2016   Chronic cholecystitis 12/09/2015   Protein-calorie malnutrition, severe 11/29/2015   Respiratory arrest ([place]) 11/28/2015   Cellulitis 11/13/2015   Recurrent cellulitis of lower leg 11/13/2015   Chronic diastolic CHF (congestive heart failure) ([place]) 11/13/2015   Chronic respiratory failure with hypoxia ([place]) 11/13/2015   ESRD on dialysis ([place]) 11/13/2015   Anemia of chronic disease 11/13/2015   Rotator cuff syndrome of right shoulder 05/30/2013   Pain in joint, shoulder region 05/30/2013   Obesity hypoventilation syndrome ([place]) 09/04/2012   Hyperkalemia 11/11/2011   Leukopenia 11/11/2011   HTN (hypertension) 11/11/2011   HLD (hyperlipidemia) 11/11/2011  Transaminitis 11/11/2011   Diabetes mellitus ([place]) 10/27/2011   Morbid obesity ([place]) 10/27/2011   Lymphedema of lower extremity 10/04/2011    Past Surgical History:  Procedure Laterality Date   .Hemodialysis catheter Right 11/28/2015   A/V FISTULAGRAM N/A 07/26/2017   Procedure: A/V Fistulagram - Left Arm;  Surgeon: Serafina Mitchell, MD;  Location: [facility];  Service: Cardiovascular;  Laterality: N/A;   ABDOMINAL HYSTERECTOMY     total hysterectomy   ADENOIDECTOMY     APPLICATION OF WOUND VAC Left 08/19/2021   Procedure: APPLICATION OF WOUND VAC;  Surgeon: Wallace Going, DO;  Location: [facility];  Service: Plastics;  Laterality: Left;   AV FISTULA PLACEMENT Left 03/15/2016   Procedure: CREATION OF LEFT ARM ARTERIOVENOUS (AV) FISTULA  ;  Surgeon: Rosetta Posner, MD;  Location: [facility];  Service: Vascular;  Laterality: Left;   CAPD INSERTION N/A 08/30/2017   Procedure: LAPAROSCOPIC INSERTION CONTINUOUS AMBULATORY PERITONEAL DIALYSIS  (CAPD) CATHETER;  Surgeon: Clovis Riley, MD;  Location: [facility];  Service: General;  Laterality: N/A;   CARDIOVERSION N/A 07/03/2021    Procedure: CARDIOVERSION;  Surgeon: Larey Dresser, MD;  Location: [facility] ENDOSCOPY;  Service: Cardiovascular;  Laterality: N/A;   CESAREAN SECTION      x 2   CESAREAN SECTION N/A    Phreesia 11/04/2020   CRANIECTOMY SUBOCCIPITAL W/ CERVICAL LAMINECTOMY / CHIARI     FISTULA SUPERFICIALIZATION Left 05/10/2016   Procedure: Left Arm FISTULA SUPERFICIALIZATION;  Surgeon: Rosetta Posner, MD;  Location: [facility];  Service: Vascular;  Laterality: Left;   I & D EXTREMITY Left 08/19/2021   Procedure: Debridement of left thigh wound;  Surgeon: Wallace Going, DO;  Location: [facility];  Service: Plastics;  Laterality: Left;   IR GENERIC HISTORICAL  08/14/2016   IR REMOVAL TUN ACCESS W/ PORT W/O FL MOD SED 08/14/2016 Arne Cleveland, MD [facility]INTER[facility]   IR SINUS/FIST TUBE CHK-NON GI  01/01/2019   IR THROMBECTOMY AV FISTULA W/THROMBOLYSIS INC/SHUNT/IMG LEFT Left 08/13/2021   IR US GUIDE BX ASP/DRAIN  12/28/2018   IR US GUIDE VASC ACCESS LEFT  08/13/2021   MINOR REMOVAL OF PERITONEAL DIALYSIS CATHETER N/A 04/26/2019   Procedure: REMOVAL OF INFECTED PERITONEAL DIALYSIS CATHETER;  Surgeon: Erroll Luna, MD;  Location: [facility];  Service: General;  Laterality: N/A;   PERIPHERAL VASCULAR BALLOON ANGIOPLASTY Left 10/18/2019   Procedure: PERIPHERAL VASCULAR BALLOON ANGIOPLASTY;  Surgeon: Marty Heck, MD;  Location: [facility];  Service: Cardiovascular;  Laterality: Left;  arm fistula   PERIPHERAL VASCULAR CATHETERIZATION Left 11/25/2016   Procedure: A/V Fistulagram;  Surgeon: Conrad Blodgett, MD;  Location: [facility];  Service: Cardiovascular;  Laterality: Left;  arm   PORTACATH PLACEMENT  07/05/2012   Procedure: INSERTION PORT-A-CATH;  Surgeon: Donato Heinz, MD;  Location: [facility];  Service: General;  Laterality: Left;  subclavian   portacath removal     RIGHT HEART CATH N/A 08/04/2017   Procedure: RIGHT HEART CATH;  Surgeon: Jolaine Artist, MD;  Location: [facility];  Service:  Cardiovascular;  Laterality: N/A;   RIGHT HEART CATH N/A 07/02/2021   Procedure: RIGHT HEART CATH;  Surgeon: Larey Dresser, MD;  Location: [facility];  Service: Cardiovascular;  Laterality: N/A;   TEE WITHOUT CARDIOVERSION N/A 07/03/2021   Procedure: TRANSESOPHAGEAL ECHOCARDIOGRAM (TEE);  Surgeon: Larey Dresser, MD;  Location: [facility] ENDOSCOPY;  Service: Cardiovascular;  Laterality: N/A;   THYROIDECTOMY, PARTIAL  TONSILLECTOMY       OB History     Gravida  4   Para  3   Term  2   Preterm  1   AB  1   Living         SAB  1   IAB      Ectopic      Multiple      Live Births              Family History  Problem Relation Age of Onset   Colon cancer Mother 31   Stroke Mother 64   Coronary artery disease Mother     Social History   Tobacco Use   Smoking status: Never   Smokeless tobacco: Never  Vaping Use   Vaping Use: Never used  Substance Use Topics   Alcohol use: No    Alcohol/week: 0.0 standard drinks   Drug use: No    Home Medications Prior to Admission medications   Medication Sig Start Date End Date Taking? Authorizing Provider  acetaminophen (TYLENOL) 325 MG tablet Take 2 tablets (650 mg total) by mouth every 4 (four) hours as needed for mild pain (or Fever >/= 101). 07/04/20  Yes Emokpae, Courage, MD  amiodarone (PACERONE) 200 MG tablet Take 1 tablet (200 mg total) by mouth daily. 08/21/21 09/20/21 Yes Dahal, Marlowe Aschoff, MD  apixaban (ELIQUIS) 5 MG TABS tablet Take 1 tablet (5 mg total) by mouth 2 (two) times daily. 08/21/21 09/20/21 Yes Dahal, Marlowe Aschoff, MD  calcium acetate (PHOSLO) 667 MG capsule Take 1,334 mg by mouth 3 (three) times daily. 05/27/21  Yes [provider]  CONSTULOSE 10 GM/15ML solution Take 20 g by mouth daily. 09/09/21  Yes [provider]  Darbepoetin Alfa (ARANESP) 25 MCG/0.42ML SOSY injection Inject 0.42 mLs (25 mcg total) into the vein every Wednesday with hemodialysis. 08/26/21  Yes Bonnell Public, MD   lanthanum (FOSRENOL) 500 MG chewable tablet Chew 1,000-1,500 mg by mouth 5 (five) times daily. 1500 mg by mouth three times a day with meals and 1000 mg by mouth twice a day with snacks   Yes [provider]  loperamide (IMODIUM) 2 MG capsule Take 2-4 mg by mouth as needed for diarrhea or loose stools.    Yes [provider]  metoprolol tartrate (LOPRESSOR) 25 MG tablet Take 0.5 tablets (12.5 mg total) by mouth 2 (two) times daily. 08/21/21 09/20/21 Yes Dahal, Marlowe Aschoff, MD  midodrine (PROAMATINE) 5 MG tablet Take 3 tablets (15 mg total) by mouth 3 (three) times daily with meals. 08/21/21 09/20/21 Yes Dahal, Marlowe Aschoff, MD  multivitamin (RENA-VIT) TABS tablet Take 1 tablet by mouth daily. 05/30/18  Yes [provider]  oxyCODONE (ROXICODONE) 5 MG immediate release tablet Take 2 tablets (10 mg total) by mouth every 6 (six) hours as needed for up to 3 days for severe pain. 08/28/21 09/17/21 Yes Trifan, Carola Rhine, MD  pantoprazole (PROTONIX) 40 MG tablet Take 1 tablet (40 mg total) by mouth 2 (two) times daily. 08/21/21 09/20/21 Yes Dahal, Marlowe Aschoff, MD  Sennosides (EX-LAX PO) Take 2 tablets by mouth daily as needed (constipation).   Yes [provider]  Oxycodone HCl 10 MG TABS Take 1 tablet (10 mg total) by mouth 4 (four) times daily as needed (pain). Patient not taking: No sig reported 06/21/21   Tat, Shanon Brow, MD  insulin lispro (HUMALOG) 100 UNIT/ML injection Inject 60 Units into the skin 2 (two) times daily.    03/08/12  [provider]  Allergies    Contrast media [iodinated diagnostic agents], Pneumococcal vaccines, and Vancomycin  Review of Systems   Review of Systems  Constitutional:  Negative for chills and fever.  HENT:  Negative for congestion.   Respiratory:  Negative for shortness of breath.   Cardiovascular:  Negative for chest pain.  Gastrointestinal:  Positive for nausea. Negative for abdominal pain and vomiting.  Genitourinary:  Negative for enuresis.   Musculoskeletal:  Negative for back pain.  Skin:  Positive for wound. Negative for rash.  Neurological:  Negative for dizziness.  Hematological:  Does not bruise/bleed easily.   Physical Exam Updated Vital Signs BP 121/72   Pulse (!) 102   Temp 97.8 F (36.6 C) (Oral)   Resp 12   Ht _0  (1.651 m)   Wt 122.5 kg   SpO2 100%   BMI 44.93 kg/m   Physical Exam Vitals and nursing note reviewed.  Constitutional:      General: She is not in acute distress.    Appearance: She is not ill-appearing.     Comments: Deconditioned state.   HENT:     Head: Normocephalic and atraumatic.     Nose: No congestion.     Mouth/Throat:     Mouth: Mucous membranes are moist.     Pharynx: Oropharynx is clear.  Eyes:     Conjunctiva/sclera: Conjunctivae normal.  Cardiovascular:     Rate and Rhythm: Normal rate and regular rhythm.     Pulses: Normal pulses.     Heart sounds: No murmur heard.   No friction rub. No gallop.  Pulmonary:     Effort: No respiratory distress.     Breath sounds: Rales present. No wheezing or rhonchi.     Comments: Patient has bibasilar Rales worse in the right lower lung versus the left. Abdominal:     Palpations: Abdomen is soft.     Tenderness: There is no abdominal tenderness. There is no right CVA tenderness or left CVA tenderness.  Musculoskeletal:     Right lower leg: Edema present.     Left lower leg: Edema present.     Comments: Patient has bilateral pedal edema 2+ both legs are symmetrical, neurovascularly intact.  She has a noted large wound on the left upper thigh on the medial aspect with wound VAC in place there is no surrounding erythema drainage or discharge present on my initial exam.  Patient moving all 4 extremities out difficulty.  Skin:    General: Skin is warm and dry.  Neurological:     Mental Status: She is alert.     Comments: No facial asymmetry, no difficult word finding, able to follow two-step commands, no unilateral weakness noted.   Psychiatric:        Mood and Affect: Mood normal.       ED Results / Procedures / Treatments   Labs (all labs ordered are listed, but only abnormal results are displayed) Labs Reviewed  COMPREHENSIVE METABOLIC PANEL - Abnormal; Notable for the following components:      Result Value   Sodium 127 (*)    Potassium 5.9 (*)    Chloride 88 (*)    CO2 21 (*)    Glucose, Bld 106 (*)    BUN 105 (*)    Creatinine, Ser 8.88 (*)    Calcium 8.2 (*)    Albumin 3.3 (*)    Alkaline Phosphatase 254 (*)    GFR, Estimated 5 (*)    Anion gap 18 (*)  All other components within normal limits  CBC WITH DIFFERENTIAL/PLATELET - Abnormal; Notable for the following components:   RBC 3.34 (*)    Hemoglobin 10.0 (*)    HCT 30.9 (*)    RDW 19.8 (*)    Neutro Abs 8.7 (*)    Abs Immature Granulocytes 0.10 (*)    All other components within normal limits  BRAIN NATRIURETIC PEPTIDE - Abnormal; Notable for the following components:   B Natriuretic Peptide 762.0 (*)    All other components within normal limits  CULTURE, BLOOD (ROUTINE X 2)  RESP PANEL BY RT-PCR (FLU A&B, COVID) ARPGX2  CULTURE, BLOOD (ROUTINE X 2)  LACTIC ACID, PLASMA  LACTIC ACID, PLASMA  MAGNESIUM  TYPE AND SCREEN  ABO/RH    EKG EKG Interpretation  Date/Time:  Thursday September 17 2021 14:42:08 EDT Ventricular Rate:  97 PR Interval:    QRS Duration: 134 QT Interval:  400 QTC Calculation: 509 R Axis:   106 Text Interpretation: Atrial flutter with varied AV block, Paired ventricular premature complexes RBBB and LPFB Confirmed by Kommor, Madison ([id number]) on 09/17/2021 3:35:41 PM  Radiology DG Chest 1 View  Result Date: 09/17/2021 CLINICAL DATA:  Edema, large wound to left upper medial thigh EXAM: CHEST  1 VIEW COMPARISON:  Chest radiograph 07/01/2021 FINDINGS: The heart is markedly enlarged, unchanged. The mediastinal contours are stable. There is vascular congestion with suspected mild pulmonary interstitial edema. There  is no focal airspace disease. There is no significant pleural effusion. There is no pneumothorax. There is no acute osseous abnormality. IMPRESSION: Marked cardiomegaly with mild pulmonary interstitial edema. Electronically Signed   By: Valetta Mole M.D.   On: 09/17/2021 14:56   DG FEMUR MIN 2 VIEWS LEFT  Result Date: 09/17/2021 CLINICAL DATA:  Wound infection of left thigh EXAM: LEFT FEMUR 2 VIEWS COMPARISON:  Femur radiographs 09/11/2015, CT femur 07/15/2021 FINDINGS: There is no evidence of acute osseous abnormality. There is no erosive or destructive change. There is degenerative change about the knee. No soft tissue gas is identified. IMPRESSION: No acute osseous abnormality or soft tissue gas identified. Electronically Signed   By: Valetta Mole M.D.   On: 09/17/2021 14:58    Procedures Procedures   Medications Ordered in ED Medications  sodium zirconium cyclosilicate (LOKELMA) packet 10 g (10 g Oral Given 09/17/21 1636)  ondansetron (ZOFRAN) injection 4 mg (4 mg Intravenous Given 09/17/21 1400)  morphine 4 MG/ML injection 4 mg (4 mg Intravenous Given 09/17/21 1443)  lidocaine-EPINEPHrine (XYLOCAINE W/EPI) 2 %-1:200000 (PF) injection 10 mL (10 mLs Intradermal Given 09/17/21 1443)  sodium bicarbonate injection 50 mEq (50 mEq Intravenous Given 09/17/21 1636)  HYDROmorphone (DILAUDID) injection 0.5 mg (0.5 mg Intravenous Given 09/17/21 1734)    ED Course  I have reviewed the triage vital signs and the nursing notes.  Pertinent labs & imaging results that were available during my care of the patient were reviewed by me and considered in my medical decision making (see chart for details).    MDM Rules/Calculators/A&P                           Initial impression-patient presents with concerns of worsening wound infection as well as missed dialysis.  She is alert, does not appear in acute distress, vital signs reassuring.  Concern for possible worsening infection, will obtain sepsis-like  work-up, also obtain lab work for missed dialysis and continue to monitor.  Work-up-CBC shows normocytic anemia with a  hemoglobin 10.  Per baseline, CMP shows hyponatremia of 127, potassium 5.9, calcium 88, CO2 of 21, glucose 106, BUN 105, creatinine 8.88, alk phos 254, anion gap 18, BNP is 762.  Magnesium 2.2, lactic 1.3.  DG of left femur negative for acute findings.  Chest x-ray reveals marked cardiomegaly with mild pulmonary interstitial edema.  EKG a flutter without signs of ischemia.  Reassessment-recommend removing wound VAC for further evaluation of wound.  With nursing staff present wound VAC was removed granulated tissue, no obvious signs infection is present, no drainage or discharge noted, unfortunately a superficial vein was ruptured, bleeding was controlled with direct pressure, Marcene Brawn, PA-C applied sutures to the vein and wound was wrapped with a compression wrap.  Will consult with vascular surgery for further recommendations.  Patient was reassessed after compression wrap was placed bleeding has been controlled no bleeding through the gauze.  Due to slight complication of wound check will speak with Dr. Marla Roe of plastic surgery for further recommendations.  Lab work is consistent with volume overloaded as well as being in uremic elevated BUN of 105 will consult with neurology for further recommendations, also has noted an elevated potassium of 5.9 prolongation of QT will provide her with a amp of bicarb as well as Lokelma.  Updated patient on recommendations from nephrology she agreed in this plan will consult hospitalist team for admission.  Consult-  Spoke with Dr. Unk Lightning the vascular surgery states that all venous bleeding will resolve on its own who recommends packing the wound and applying a compression wrap.  If unable to control recommend setting down to [facility] for further evaluation.  2.  spoke with [name], PA-C who works with Dr. Marla Roe she  reviewed the pictures states the wound appears to be healing well, they recommend discontinuing the wound VAC and placing Xeroform over the wound,  change dressing daily.  They can follow-up with her on an outpatient setting.  3. Spoke with Dr. Moshe Cipro who recommends hospital admission, will need to be dialyzed today no need for urgent dialysis at this moment.  4. Spoke with Dr. Josephine Cables who will admit the patient  Rule out-low suspicion for systemic infection as patient is nontoxic-appearing, vital signs reassuring, no leukocytosis, no lactic acid, wound does not show signs of infection, x-ray negative for osteomyelitis.  Low suspicion for emergent hemodialysis as patient does not show signs of respiratory distress, no significant EKG changes, she is not somnolent, alert and oriented.  No suspicion for ACS as patient chest pain, shortness of breath, EKG without signs of ischemia.  Plan-admit for urgent dialysis.   Final Clinical Impression(s) / ED Diagnoses Final diagnoses:  Visit for wound check  Dialysis patient, noncompliant [facility])    Rx / DC Orders ED Discharge Orders     None        Marcello Fennel, PA-C 09/17/21 1832    Isla Pence, MD 09/18/21 [phone]

## 2021-09-17 NOTE — ED Notes (Signed)
Quick clot applied to wound with gauze and wrapped with ace bandage. New chucks placed underneath patient to assess for bleeding.

## 2021-09-17 NOTE — Progress Notes (Signed)
Was informed that this patient was in the ER-  has not been to dialysis since 9/23.  She went home from a 2 month hospitalization on 9/5-  came to ER on 9/9 as well as today.  She was advised to discharge to a SNF but wanted to go home instead.  She is on dialysis [facility] on MWF-  has cardiogenic shock, morbid obesity and a significant left thigh wound  She has a slightly high K and evidence of volume overload.  We are contacted to provide HD.  I have talked to the HD nurse and HD will e provided early in the AM  [place]

## 2021-09-17 NOTE — ED Notes (Signed)
Patient's bandage has no visible bleeding at this time.

## 2021-09-17 NOTE — ED Triage Notes (Addendum)
Pt brought in by [facility] from home with c/o possibly wound infection on left thigh. Pt had it recently debrided, but now reports "it's worse than it was". Pt has wound vac present. Pt contacted her surgeon yesterday and they told her to come to ED for further evaluation. Pt also reports she hasn't had dialysis since last Friday because she didn't feel well enough to go Monday or today.

## 2021-09-17 NOTE — Progress Notes (Deleted)
Patient is a 55 year old female with PMH of ESRD on HD MWF, CHF, obesity, chronic lymphedema, and left upper leg wound that developed when she was admitted for cardiogenic shock required debridement and wound VAC placement 08/19/2021 by Dr. Marla Roe who presents to clinic for postoperative follow-up.  Patient was recommended for SNF, but insisted on discharge home.  She has since been seen in the ED on multiple occasions for ongoing wound site pain in addition to shortness of breath/fluid overload in context of missing her dialysis appointments.

## 2021-09-18 ENCOUNTER — Inpatient Hospital Stay (HOSPITAL_COMMUNITY): Payer: 59

## 2021-09-18 ENCOUNTER — Telehealth: Payer: Self-pay

## 2021-09-18 DIAGNOSIS — E8809 Other disorders of plasma-protein metabolism, not elsewhere classified: Secondary | ICD-10-CM

## 2021-09-18 DIAGNOSIS — S71102D Unspecified open wound, left thigh, subsequent encounter: Secondary | ICD-10-CM

## 2021-09-18 DIAGNOSIS — I10 Essential (primary) hypertension: Secondary | ICD-10-CM

## 2021-09-18 DIAGNOSIS — Z992 Dependence on renal dialysis: Secondary | ICD-10-CM

## 2021-09-18 DIAGNOSIS — E46 Unspecified protein-calorie malnutrition: Secondary | ICD-10-CM

## 2021-09-18 DIAGNOSIS — I482 Chronic atrial fibrillation, unspecified: Secondary | ICD-10-CM

## 2021-09-18 DIAGNOSIS — I5031 Acute diastolic (congestive) heart failure: Secondary | ICD-10-CM

## 2021-09-18 DIAGNOSIS — R772 Abnormality of alphafetoprotein: Secondary | ICD-10-CM | POA: Insufficient documentation

## 2021-09-18 DIAGNOSIS — J811 Chronic pulmonary edema: Secondary | ICD-10-CM

## 2021-09-18 DIAGNOSIS — R7989 Other specified abnormal findings of blood chemistry: Secondary | ICD-10-CM

## 2021-09-18 DIAGNOSIS — I890 Lymphedema, not elsewhere classified: Secondary | ICD-10-CM

## 2021-09-18 DIAGNOSIS — N186 End stage renal disease: Secondary | ICD-10-CM

## 2021-09-18 DIAGNOSIS — R9431 Abnormal electrocardiogram [ECG] [EKG]: Secondary | ICD-10-CM

## 2021-09-18 DIAGNOSIS — J810 Acute pulmonary edema: Secondary | ICD-10-CM

## 2021-09-18 DIAGNOSIS — R748 Abnormal levels of other serum enzymes: Secondary | ICD-10-CM

## 2021-09-18 DIAGNOSIS — E0800 Diabetes mellitus due to underlying condition with hyperosmolarity without nonketotic hyperglycemic-hyperosmolar coma (NKHHC): Secondary | ICD-10-CM | POA: Diagnosis not present

## 2021-09-18 DIAGNOSIS — E875 Hyperkalemia: Secondary | ICD-10-CM

## 2021-09-18 DIAGNOSIS — E871 Hypo-osmolality and hyponatremia: Secondary | ICD-10-CM

## 2021-09-18 DIAGNOSIS — E782 Mixed hyperlipidemia: Secondary | ICD-10-CM

## 2021-09-18 LAB — COMPREHENSIVE METABOLIC PANEL
ALT: 14 U/L (ref 0–44)
AST: 15 U/L (ref 15–41)
Albumin: 3.2 g/dL — ABNORMAL LOW (ref 3.5–5.0)
Alkaline Phosphatase: 223 U/L — ABNORMAL HIGH (ref 38–126)
Anion gap: 20 — ABNORMAL HIGH (ref 5–15)
BUN: 114 mg/dL — ABNORMAL HIGH (ref 6–20)
CO2: 20 mmol/L — ABNORMAL LOW (ref 22–32)
Calcium: 8.1 mg/dL — ABNORMAL LOW (ref 8.9–10.3)
Chloride: 90 mmol/L — ABNORMAL LOW (ref 98–111)
Creatinine, Ser: 9.3 mg/dL — ABNORMAL HIGH (ref 0.44–1.00)
GFR, Estimated: 5 mL/min — ABNORMAL LOW (ref >60)
Glucose, Bld: 79 mg/dL (ref 70–99)
Potassium: 6.3 mmol/L (ref 3.5–5.1)
Sodium: 130 mmol/L — ABNORMAL LOW (ref 135–145)
Total Bilirubin: 1.5 mg/dL — ABNORMAL HIGH (ref 0.3–1.2)
Total Protein: 7.3 g/dL (ref 6.5–8.1)

## 2021-09-18 LAB — CBC
HCT: 26.8 % — ABNORMAL LOW (ref 36.0–46.0)
Hemoglobin: 9 g/dL — ABNORMAL LOW (ref 12.0–15.0)
MCH: 30.6 pg (ref 26.0–34.0)
MCHC: 33.6 g/dL (ref 30.0–36.0)
MCV: 91.2 fL (ref 80.0–100.0)
Platelets: 298 10*3/uL (ref 150–400)
RBC: 2.94 MIL/uL — ABNORMAL LOW (ref 3.87–5.11)
RDW: 19.3 % — ABNORMAL HIGH (ref 11.5–15.5)
WBC: 9.9 10*3/uL (ref 4.0–10.5)
nRBC: 0.2 % (ref 0.0–0.2)

## 2021-09-18 LAB — ECHOCARDIOGRAM LIMITED
Height: 65 in
S' Lateral: 3.3 cm
Weight: 4416.25 oz

## 2021-09-18 LAB — HIV ANTIBODY (ROUTINE TESTING W REFLEX): HIV Screen 4th Generation wRfx: NONREACTIVE

## 2021-09-18 LAB — HEPATITIS B SURFACE ANTIBODY,QUALITATIVE: Hep B S Ab: NONREACTIVE

## 2021-09-18 LAB — HEMOGLOBIN A1C
Hgb A1c MFr Bld: 6.8 % — ABNORMAL HIGH (ref 4.8–5.6)
Mean Plasma Glucose: 148.46 mg/dL

## 2021-09-18 LAB — PROTIME-INR
INR: 1.6 — ABNORMAL HIGH (ref 0.8–1.2)
Prothrombin Time: 18.6 seconds — ABNORMAL HIGH (ref 11.4–15.2)

## 2021-09-18 LAB — PHOSPHORUS: Phosphorus: 7.4 mg/dL — ABNORMAL HIGH (ref 2.5–4.6)

## 2021-09-18 LAB — HEPATITIS B SURFACE ANTIGEN: Hepatitis B Surface Ag: NONREACTIVE

## 2021-09-18 LAB — APTT: aPTT: 32 seconds (ref 24–36)

## 2021-09-18 LAB — MAGNESIUM: Magnesium: 2.2 mg/dL (ref 1.7–2.4)

## 2021-09-18 MED ORDER — PERFLUTREN LIPID MICROSPHERE
1.0000 mL | INTRAVENOUS | Status: AC | PRN
  Administered 2021-09-18: 4 mL via INTRAVENOUS

## 2021-09-18 MED ORDER — ENSURE ENLIVE PO LIQD
237.0000 mL | Freq: Two times a day (BID) | ORAL | Status: DC
  Administered 2021-09-18 – 2021-10-01 (×12): 237 mL via ORAL

## 2021-09-18 MED ORDER — APIXABAN 5 MG PO TABS
5.0000 mg | ORAL_TABLET | Freq: Two times a day (BID) | ORAL | Status: DC
  Administered 2021-09-18 – 2021-09-30 (×23): 5 mg via ORAL
  Filled 2021-09-18 (×24): qty 1

## 2021-09-18 MED ORDER — LIDOCAINE HCL (PF) 1 % IJ SOLN: 5.0000 mL | INTRAMUSCULAR | Status: DC | PRN

## 2021-09-18 MED ORDER — INFLUENZA VAC SPLIT QUAD 0.5 ML IM SUSY: 0.5000 mL | PREFILLED_SYRINGE | INTRAMUSCULAR | Status: DC

## 2021-09-18 MED ORDER — DARBEPOETIN ALFA 100 MCG/0.5ML IJ SOSY
PREFILLED_SYRINGE | INTRAMUSCULAR | Status: AC
  Administered 2021-09-18: 100 ug via INTRAVENOUS
  Filled 2021-09-18: qty 0.5

## 2021-09-18 MED ORDER — LANTHANUM CARBONATE 500 MG PO CHEW
500.0000 mg | CHEWABLE_TABLET | Freq: Three times a day (TID) | ORAL | Status: DC
  Administered 2021-09-18 – 2021-10-03 (×44): 500 mg via ORAL
  Filled 2021-09-18 (×54): qty 1

## 2021-09-18 MED ORDER — SODIUM ZIRCONIUM CYCLOSILICATE 10 G PO PACK: 10.0000 g | PACK | Freq: Once | ORAL | Status: DC

## 2021-09-18 MED ORDER — DARBEPOETIN ALFA 100 MCG/0.5ML IJ SOSY
100.0000 ug | PREFILLED_SYRINGE | INTRAMUSCULAR | Status: DC
  Administered 2021-09-25: 100 ug via INTRAVENOUS
  Filled 2021-09-18 (×3): qty 0.5

## 2021-09-18 MED ORDER — METOPROLOL TARTRATE 25 MG PO TABS
12.5000 mg | ORAL_TABLET | Freq: Two times a day (BID) | ORAL | Status: DC
  Administered 2021-09-18: 12.5 mg via ORAL
  Filled 2021-09-18 (×4): qty 1

## 2021-09-18 MED ORDER — SODIUM CHLORIDE 0.9 % IV SOLN: 100.0000 mL | INTRAVENOUS | Status: DC | PRN

## 2021-09-18 MED ORDER — HEPARIN SODIUM (PORCINE) 1000 UNIT/ML DIALYSIS: 20.0000 [IU]/kg | INTRAMUSCULAR | Status: DC | PRN

## 2021-09-18 MED ORDER — LIDOCAINE-PRILOCAINE 2.5-2.5 % EX CREA
1.0000 "application " | TOPICAL_CREAM | CUTANEOUS | Status: DC | PRN
  Filled 2021-09-18: qty 5

## 2021-09-18 MED ORDER — MIDODRINE HCL 5 MG PO TABS
10.0000 mg | ORAL_TABLET | Freq: Three times a day (TID) | ORAL | Status: DC
  Administered 2021-09-18 – 2021-09-20 (×6): 10 mg via ORAL
  Filled 2021-09-18 (×6): qty 2

## 2021-09-18 MED ORDER — DEXTROSE 50 % IV SOLN: 1.0000 | Freq: Once | INTRAVENOUS | Status: DC

## 2021-09-18 MED ORDER — PENTAFLUOROPROP-TETRAFLUOROETH EX AERO
1.0000 "application " | INHALATION_SPRAY | CUTANEOUS | Status: DC | PRN
  Filled 2021-09-18: qty 30

## 2021-09-18 MED ORDER — CALCIUM ACETATE (PHOS BINDER) 667 MG PO CAPS
1334.0000 mg | ORAL_CAPSULE | Freq: Three times a day (TID) | ORAL | Status: DC
  Administered 2021-09-19 – 2021-10-03 (×40): 1334 mg via ORAL
  Filled 2021-09-18 (×40): qty 2

## 2021-09-18 MED ORDER — SODIUM CHLORIDE 0.9 % IV BOLUS
500.0000 mL | Freq: Once | INTRAVENOUS | Status: AC
  Administered 2021-09-18: 500 mL via INTRAVENOUS

## 2021-09-18 MED ORDER — INSULIN ASPART 100 UNIT/ML IV SOLN
5.0000 [IU] | Freq: Once | INTRAVENOUS | Status: AC
  Administered 2021-09-18: 5 [IU] via INTRAVENOUS

## 2021-09-18 MED ORDER — RENA-VITE PO TABS
1.0000 | ORAL_TABLET | Freq: Every day | ORAL | Status: DC
  Administered 2021-09-18 – 2021-10-03 (×14): 1 via ORAL
  Filled 2021-09-18 (×15): qty 1

## 2021-09-18 MED ORDER — HEPARIN SODIUM (PORCINE) 1000 UNIT/ML IJ SOLN
INTRAMUSCULAR | Status: AC
  Administered 2021-09-18: 2500 [IU] via INTRAVENOUS_CENTRAL
  Filled 2021-09-18: qty 3

## 2021-09-18 MED ORDER — ALBUMIN HUMAN 25 % IV SOLN
INTRAVENOUS | Status: AC
  Administered 2021-09-18: 25 g via INTRAVENOUS
  Filled 2021-09-18: qty 200

## 2021-09-18 MED ORDER — ALBUMIN HUMAN 25 % IV SOLN
25.0000 g | INTRAVENOUS | Status: AC | PRN
Start: 2021-09-18 — End: 2021-09-25
  Administered 2021-09-25: 25 g via INTRAVENOUS

## 2021-09-18 MED ORDER — SODIUM CHLORIDE 0.9 % IV BOLUS: 500.0000 mL | Freq: Once | INTRAVENOUS | Status: DC

## 2021-09-18 MED ORDER — PANTOPRAZOLE SODIUM 40 MG PO TBEC
40.0000 mg | DELAYED_RELEASE_TABLET | Freq: Two times a day (BID) | ORAL | Status: DC
  Administered 2021-09-18 – 2021-10-03 (×30): 40 mg via ORAL
  Filled 2021-09-18 (×31): qty 1

## 2021-09-18 NOTE — H&P (Signed)
History and Physical  [name] VHQ:[id number] DOB: 1966/07/12 DOA: 09/17/2021  Referring physician: Marcello Fennel, PA-C  PCP: Noreene Larsson, NP  Patient coming from: Home  Chief Complaint: Worsening left thigh wound infection  HPI: [name] is a 55 y.o. female with medical history significant for hypertension, CHF, ESRD on HD (MWF), COPD, type 2 diabetes mellitus, chronic lymphedema, obesity who presents to the emergency department due to worsening left thigh wound infection and missed dialysis sessions.  Patient was admitted on 7/3 and discharged on 9/5 (64 days) due to left medial thigh wound during which she underwent I&D on 8/31 by plastic surgery and wound VAC was placed, and she also had septic shock and CHF exacerbation.  She was advised to be discharged to a SNF, but patient preferred to go home.  She was seen on 9/9 in the ED due to concern for left thigh wound infection after being advised by her wound care nurse.  Patient complained of worsening pain and purulent with foul smelling discharge from the wound site, pain was of moderate intensity, it was constant and was without any radiation.  Her wound care nurse recommended that she goes to the ED for further evaluation and management.  She denies fever, chills, chest pain, shortness of breath.  Patient also endorsed missing 2 dialysis sessions with last session being on Friday (9/23).  Patient states that she missed Monday and Wednesday because she was mostly bedbound and she did not have any assistance to help with going to the dialysis center.  ED Course:  In the emergency department, she was hemodynamically stable.  Work-up in the ED showed normocytic anemia, hyponatremia, hyperkalemia, elevated BNP, hypoalbuminemia, elevated ALP, anion gap of 18, lactic acid 1.3. Left femoral x-ray showed no acute osseous abnormality or soft tissue gas identified Chest x-ray showed marked cardiomegaly with mild pulmonary  interstitial edema Wound VAC was removed for further evaluation of wound which showed elevated tissue, but patient had a bleed due to rupture of his superficial vein which was controlled with direct pressure and sutures and wrapped with a compression wrap. Vascular surgeon was consulted and recommended packing the wound and applying compression wrap (already done) and that patient could be transferred to [facility] if bleeding was uncontrolled., Plastic surgeon was also consulted and recommend discontinuing the wound VAC and Toprol-XL foam over the wound and to change dressing daily and that patient could follow-up with her on an outpatient setting. Nephrologist was consulted regarding dialysis and patient will be dialyzed early in the morning. Hospitalist was asked to admit patient for further evaluation and management.  Review of Systems: Constitutional: Negative for chills and fever.  HENT: Negative for ear pain and sore throat.   Eyes: Negative for pain and visual disturbance.  Respiratory: Negative for cough, chest tightness and shortness of breath.   Cardiovascular: Negative for chest pain and palpitations.  Gastrointestinal: Positive for nausea.  Negative for abdominal pain  Endocrine: Negative for polyphagia and polyuria.  Genitourinary: Negative for decreased urine volume, dysuria, enuresis Musculoskeletal: Negative for arthralgias and back pain.  Skin: Positive for left thigh wound.  Negative for rash.  Allergic/Immunologic: Negative for immunocompromised state.  Neurological: Negative for tremors, syncope, speech difficulty Hematological: Does not bruise/bleed easily.  All other systems reviewed and are negative   Past Medical History:  Diagnosis Date   Allergy    Phreesia 11/04/2020   Anemia, unspecified    Anxiety    Arthritis  Phreesia 11/04/2020   Asthma    as a child   Cancer ([place])    thyroid   Cellulitis    CHF (congestive heart failure) ([place])    Chiari  malformation    s/p surgery   Chiari malformation    Chronic kidney disease    Phreesia 11/04/2020   Chronic kidney disease, stage 5 (HCC)    Chronic obstructive pulmonary disease, unspecified ([place])    Chronic pain    Complication of anesthesia 11/28/15   Resp arrest after  conscious  sedation   Compression of brain (HCC)    Constipation    COPD (chronic obstructive pulmonary disease) ([place])    Phreesia 11/04/2020   Coronary artery disease    40-50% mid LAD 04/29/09, Medical tx. (Dr. Gwenlyn Found)   Diabetes mellitus    Type II- reports being off all medication d/t it being controlled   Diabetes mellitus without complication ([place])    Phreesia 11/04/2020   Essential (primary) hypertension    Fibromyalgia    Fibromyalgia    History of blood transfusion    hemorrage duinrg pregancy   Hx of echocardiogram 10/2011   EF 55-60%   Hypercholesterolemia    Hyperlipidemia, unspecified    Hypertension    Hypertensive chronic kidney disease with stage 1 through stage 4 chronic kidney disease, or unspecified chronic kidney disease    Hypertensive chronic kidney disease with stage 5 chronic kidney disease or end stage renal disease (HCC)    Lymph edema    Lymphedema, not elsewhere classified    Morbid (severe) obesity due to excess calories ([place])    Obesity hypoventilation syndrome ([place])    On home oxygen therapy    "2L; 24/7" (12/21/2018)   Peritonitis, unspecified ([place])    Pneumonia    in past   Pulmonary hypertension ([place])    Renal disorder    M/W/F [facility] started dialysis in Dec.2016   S/P colonoscopy 05/26/2007   Dr. Laural Golden sigmoid diverticulosis random biopsies benign   S/P endoscopy 05/01/2009   Dr. Penelope Coop pill-induced esophageal ulcerations distal to midesophagus, 2 small ulcers in the antrum of the stomach   Shortness of breath dyspnea    with any exertion or if heart rate  is irregular while on dialysis   Sleep apnea    reports that she no longer needs CPAP due to  weight loss   Sleep apnea, unspecified    Type 2 diabetes mellitus with diabetic nephropathy ([place])    Type 2 diabetes mellitus with hyperglycemia ([place])    Past Surgical History:  Procedure Laterality Date   .Hemodialysis catheter Right 11/28/2015   A/V FISTULAGRAM N/A 07/26/2017   Procedure: A/V Fistulagram - Left Arm;  Surgeon: Serafina Mitchell, MD;  Location: [place] [facility];  Service: Cardiovascular;  Laterality: N/A;   ABDOMINAL HYSTERECTOMY     total hysterectomy   ADENOIDECTOMY     APPLICATION OF WOUND VAC Left 08/19/2021   Procedure: APPLICATION OF WOUND VAC;  Surgeon: Wallace Going, DO;  Location: [place];  Service: Plastics;  Laterality: Left;   AV FISTULA PLACEMENT Left 03/15/2016   Procedure: CREATION OF LEFT ARM ARTERIOVENOUS (AV) FISTULA  ;  Surgeon: Rosetta Posner, MD;  Location: [place];  Service: Vascular;  Laterality: Left;   CAPD INSERTION N/A 08/30/2017   Procedure: LAPAROSCOPIC INSERTION CONTINUOUS AMBULATORY PERITONEAL DIALYSIS  (CAPD) CATHETER;  Surgeon: Clovis Riley, MD;  Location: [place];  Service: General;  Laterality: N/A;  CARDIOVERSION N/A 07/03/2021   Procedure: CARDIOVERSION;  Surgeon: Larey Dresser, MD;  Location: [facility];  Service: Cardiovascular;  Laterality: N/A;   CESAREAN SECTION      x 2   CESAREAN SECTION N/A    Phreesia 11/04/2020   CRANIECTOMY SUBOCCIPITAL W/ CERVICAL LAMINECTOMY / CHIARI     FISTULA SUPERFICIALIZATION Left 05/10/2016   Procedure: Left Arm FISTULA SUPERFICIALIZATION;  Surgeon: Rosetta Posner, MD;  Location: [place];  Service: Vascular;  Laterality: Left;   I & D EXTREMITY Left 08/19/2021   Procedure: Debridement of left thigh wound;  Surgeon: Wallace Going, DO;  Location: [place];  Service: Plastics;  Laterality: Left;   IR GENERIC HISTORICAL  08/14/2016   IR REMOVAL TUN ACCESS W/ PORT W/O FL MOD SED 08/14/2016 Arne Cleveland, MD [facility]   IR SINUS/FIST TUBE CHK-NON GI  01/01/2019   IR THROMBECTOMY AV FISTULA  W/THROMBOLYSIS INC/SHUNT/IMG LEFT Left 08/13/2021   IR US GUIDE BX ASP/DRAIN  12/28/2018   IR US GUIDE VASC ACCESS LEFT  08/13/2021   MINOR REMOVAL OF PERITONEAL DIALYSIS CATHETER N/A 04/26/2019   Procedure: REMOVAL OF INFECTED PERITONEAL DIALYSIS CATHETER;  Surgeon: Erroll Luna, MD;  Location: [place];  Service: General;  Laterality: N/A;   PERIPHERAL VASCULAR BALLOON ANGIOPLASTY Left 10/18/2019   Procedure: PERIPHERAL VASCULAR BALLOON ANGIOPLASTY;  Surgeon: Marty Heck, MD;  Location: [place] [facility];  Service: Cardiovascular;  Laterality: Left;  arm fistula   PERIPHERAL VASCULAR CATHETERIZATION Left 11/25/2016   Procedure: A/V Fistulagram;  Surgeon: Conrad Ralston, MD;  Location: [place] [facility];  Service: Cardiovascular;  Laterality: Left;  arm   PORTACATH PLACEMENT  07/05/2012   Procedure: INSERTION PORT-A-CATH;  Surgeon: Donato Heinz, MD;  Location: [facility];  Service: General;  Laterality: Left;  subclavian   portacath removal     RIGHT HEART CATH N/A 08/04/2017   Procedure: RIGHT HEART CATH;  Surgeon: Jolaine Artist, MD;  Location: [place] [facility];  Service: Cardiovascular;  Laterality: N/A;   RIGHT HEART CATH N/A 07/02/2021   Procedure: RIGHT HEART CATH;  Surgeon: Larey Dresser, MD;  Location: [place] [facility];  Service: Cardiovascular;  Laterality: N/A;   TEE WITHOUT CARDIOVERSION N/A 07/03/2021   Procedure: TRANSESOPHAGEAL ECHOCARDIOGRAM (TEE);  Surgeon: Larey Dresser, MD;  [facility];  Service: Cardiovascular;  Laterality: N/A;   THYROIDECTOMY, PARTIAL     TONSILLECTOMY      Social History:  reports that she has never smoked. She has never used smokeless tobacco. She reports that she does not drink alcohol and does not use drugs.   Allergies  Allergen Reactions   Contrast Media [Iodinated Diagnostic Agents] Anaphylaxis, Hives, Swelling and Other (See Comments)    Dye for cardiac cath. Tongue swells   Pneumococcal Vaccines Swelling and Other  (See Comments)    Turns skin black, and bodily swelling   Vancomycin Nausea And Vomiting and Other (See Comments)    Infusion "made me feel like I was dying" had to be readmitted to hospital    Family History  Problem Relation Age of Onset   Colon cancer Mother 48   Stroke Mother 76   Coronary artery disease Mother      Prior to Admission medications   Medication Sig Start Date End Date Taking? Authorizing Provider  acetaminophen (TYLENOL) 325 MG tablet Take 2 tablets (650 mg total) by mouth every 4 (four) hours as needed for mild pain (or Fever >/=  101). 07/04/20  Yes Emokpae, Courage, MD  amiodarone (PACERONE) 200 MG tablet Take 1 tablet (200 mg total) by mouth daily. 08/21/21 09/20/21 Yes Dahal, Marlowe Aschoff, MD  apixaban (ELIQUIS) 5 MG TABS tablet Take 1 tablet (5 mg total) by mouth 2 (two) times daily. 08/21/21 09/20/21 Yes Dahal, Marlowe Aschoff, MD  calcium acetate (PHOSLO) 667 MG capsule Take 1,334 mg by mouth 3 (three) times daily. 05/27/21  Yes [provider]  CONSTULOSE 10 GM/15ML solution Take 20 g by mouth daily. 09/09/21  Yes [provider]  Darbepoetin Alfa (ARANESP) 25 MCG/0.42ML SOSY injection Inject 0.42 mLs (25 mcg total) into the vein every Wednesday with hemodialysis. 08/26/21  Yes Bonnell Public, MD  lanthanum (FOSRENOL) 500 MG chewable tablet Chew 1,000-1,500 mg by mouth 5 (five) times daily. 1500 mg by mouth three times a day with meals and 1000 mg by mouth twice a day with snacks   Yes [provider]  loperamide (IMODIUM) 2 MG capsule Take 2-4 mg by mouth as needed for diarrhea or loose stools.    Yes [provider]  metoprolol tartrate (LOPRESSOR) 25 MG tablet Take 0.5 tablets (12.5 mg total) by mouth 2 (two) times daily. 08/21/21 09/20/21 Yes Dahal, Marlowe Aschoff, MD  midodrine (PROAMATINE) 5 MG tablet Take 3 tablets (15 mg total) by mouth 3 (three) times daily with meals. 08/21/21 09/20/21 Yes Dahal, Marlowe Aschoff, MD  multivitamin (RENA-VIT) TABS tablet Take 1  tablet by mouth daily. 05/30/18  Yes [provider]  oxyCODONE (ROXICODONE) 5 MG immediate release tablet Take 2 tablets (10 mg total) by mouth every 6 (six) hours as needed for up to 3 days for severe pain. 08/28/21 09/17/21 Yes Trifan, Carola Rhine, MD  pantoprazole (PROTONIX) 40 MG tablet Take 1 tablet (40 mg total) by mouth 2 (two) times daily. 08/21/21 09/20/21 Yes Dahal, Marlowe Aschoff, MD  Sennosides (EX-LAX PO) Take 2 tablets by mouth daily as needed (constipation).   Yes [provider]  Oxycodone HCl 10 MG TABS Take 1 tablet (10 mg total) by mouth 4 (four) times daily as needed (pain). Patient not taking: No sig reported 06/21/21   Tat, Shanon Brow, MD  insulin lispro (HUMALOG) 100 UNIT/ML injection Inject 60 Units into the skin 2 (two) times daily.    03/08/12  [provider]    Physical Exam: BP 130/75   Pulse 75   Temp 97.9 F (36.6 C)   Resp 18   Ht _0  (1.651 m)   Wt 122.5 kg   SpO2 100%   BMI 44.93 kg/m   General: 55 y.o. year-old female well developed well nourished in no acute distress.  Alert and oriented x3. HEENT: NCAT, EOMI Neck: Supple, trachea medial Cardiovascular: Regular rate and rhythm with no rubs or gallops.  No thyromegaly or JVD noted.  Bilateral lower extremity edema. 2/4 pulses in all 4 extremities. Respiratory: Bilateral rales in lower lobes ( R > L).  No wheezes Abdomen: Soft, nontender nondistended with normal bowel sounds x4 quadrants. Muskuloskeletal: Leg wound noted on left medial thigh.  LLE with lymphedema Neuro: CN II-XII intact, strength 5/5 x 4, sensation, reflexes intact Skin: No ulcerative lesions noted or rashes Psychiatry: Judgement and insight appear normal. Mood is appropriate for condition and setting          Labs on Admission:  Basic Metabolic Panel: Recent Labs  Lab 09/17/21 1325  NA 127*  K 5.9*  CL 88*  CO2 21*  GLUCOSE 106*  BUN 105*  CREATININE 8.88*  CALCIUM 8.2*  MG 2.2   Liver Function Tests: Recent  Labs  Lab 09/17/21 1325  AST 15  ALT 16  ALKPHOS 254*  BILITOT 1.2  PROT 7.8  ALBUMIN 3.3*   No results for input(s): LIPASE, AMYLASE in the last 168 hours. No results for input(s): AMMONIA in the last 168 hours. CBC: Recent Labs  Lab 09/17/21 1325  WBC 10.4  NEUTROABS 8.7*  HGB 10.0*  HCT 30.9*  MCV 92.5  PLT 295   Cardiac Enzymes: No results for input(s): CKTOTAL, CKMB, CKMBINDEX, TROPONINI in the last 168 hours.  BNP (last 3 results) Recent Labs    06/29/21 1534 07/01/21 0550 09/17/21 1325  BNP 718.9* 2,062.9* 762.0*    ProBNP (last 3 results) No results for input(s): PROBNP in the last 8760 hours.  CBG: No results for input(s): GLUCAP in the last 168 hours.  Radiological Exams on Admission: DG Chest 1 View  Result Date: 09/17/2021 CLINICAL DATA:  Edema, large wound to left upper medial thigh EXAM: CHEST  1 VIEW COMPARISON:  Chest radiograph 07/01/2021 FINDINGS: The heart is markedly enlarged, unchanged. The mediastinal contours are stable. There is vascular congestion with suspected mild pulmonary interstitial edema. There is no focal airspace disease. There is no significant pleural effusion. There is no pneumothorax. There is no acute osseous abnormality. IMPRESSION: Marked cardiomegaly with mild pulmonary interstitial edema. Electronically Signed   By: Valetta Mole M.D.   On: 09/17/2021 14:56   DG FEMUR MIN 2 VIEWS LEFT  Result Date: 09/17/2021 CLINICAL DATA:  Wound infection of left thigh EXAM: LEFT FEMUR 2 VIEWS COMPARISON:  Femur radiographs 09/11/2015, CT femur 07/15/2021 FINDINGS: There is no evidence of acute osseous abnormality. There is no erosive or destructive change. There is degenerative change about the knee. No soft tissue gas is identified. IMPRESSION: No acute osseous abnormality or soft tissue gas identified. Electronically Signed   By: Valetta Mole M.D.   On: 09/17/2021 14:58    EKG: I independently viewed the EKG done and my findings are as  followed: Atrial flutter with variable AV block.  RBBB, LAFB, QTC (509 ms)  Assessment/Plan Present on Admission:  Open wound of left thigh  Hyperkalemia  HTN (hypertension)  HLD (hyperlipidemia)  Principal Problem:   Open wound of left thigh Active Problems:   Lymphedema of lower extremity   Diabetes mellitus (HCC)   Hyperkalemia   HTN (hypertension)   HLD (hyperlipidemia)   ESRD on dialysis (HCC)   Lymphedema   Obesity, Class III, BMI 40-49.9 (morbid obesity) (HCC)   Hyponatremia   Hypoalbuminemia due to protein-calorie malnutrition (HCC)   Elevated brain natriuretic peptide (BNP) level   Elevated alpha fetoprotein   Atrial fibrillation, chronic (HCC)  Left thigh wound Continue wound care Vascular surgery consulted recommended wound compression and only to transfer to [facility] if left thigh wound which was bleeding was uncontrolled per ED medical record Plastic surgery consulted and recommended discontinuing wound VAC per ED medical record Continue fall precaution and neurochecks Consider PT/OT eval and treat  ESRD on HD (MWF) Last HD session was on Friday (9/23), patient missed 2 sessions of dialysis Continue PhosLo and Rena-Vit Nephrologist (Dr. Moshe Cipro) was already consulted by the ED team and HD will be done early in the morning  Hyperkalemia K+ 5.9 Sodium bicarbonate and Lokelma were given Nephrologist was consulted and dialysis will be done early in the morning  Hyponatremia Na 127 (chronically low but ranged within 130-133 within the past month) This  will be corrected in dialysis  Pulmonary edema possibly secondary to fluid overload rule out CHF exacerbation This is possibly due to fluid overload secondary to patient missing 2 sessions of dialysis Echocardiogram done on 07/03/2021 showed LVEF of 55 to 60% with normal wall motion, but showed severely dilated and moderately dysfunctional RV. BNP 762 (unreliable in the setting of CKD with elevated  creatinine) Continue total input/output, daily weights and fluid restriction Continue renal diet  Consider a repeat chest x-ray after dialysis Echocardiogram will be done in the morning   Chronic LLE lymphedema Stable  Chronic A. fib Continue Eliquis Amiodarone will be temporarily held at this time due to prolonged Qtc Metoprolol will be temporarily held due to soft BP  Prolonged QTc (509 ms) Avoid QT prolonging drugs Magnesium level will be checked Repeat EKG in the morning  Hypoalbuminemia possibly secondary to mild protein calorie malnutrition Albumin 3.3.  Protein supplement to be provided  Elevated alkaline phosphatase ALP 254.  Asymptomatic, continue to monitor liver enzymes  Obesity class III (BMI 44.93) She will be counseled on diet and lifestyle modification when more stable  Essential hypertension Metoprolol will be temporarily held due to soft BP  Mixed hyperlipidemia No antihyperlipidemic medication noted on med rec We shall await updated med rec  Type 2 diabetes mellitus A1c 2 months ago was 6.9 No antidiabetic medication noted on med rec CBG is currently at 106, hemoglobin A1c will be repeated   DVT prophylaxis: Eliquis  Code Status: Full code  Family Communication: None at bedside  Disposition Plan:  Patient is from:                        home Anticipated DC to:                   SNF or family members home Anticipated DC date:               2-3 days Anticipated DC barriers:          Patient requires inpatient management due to left thigh wound and pending dialysis in the morning    Consults called: Nephrology, vascular surgery, plastic surgery-all by ED team  Admission status: Inpatient    Bernadette Hoit MD [facility]  09/18/2021, 12:39 AM

## 2021-09-18 NOTE — Plan of Care (Signed)
  Problem: Acute Rehab OT Goals (only OT should resolve) Goal: Pt. Will Perform Grooming Flowsheets (Taken 09/18/2021 1018) Pt Will Perform Grooming:  standing  with mod assist  with adaptive equipment  with max assist Goal: Pt. Will Perform Lower Body Bathing Flowsheets (Taken 09/18/2021 1018) Pt Will Perform Lower Body Bathing:  sitting/lateral leans  with adaptive equipment  with min assist  with mod assist Goal: Pt. Will Perform Lower Body Dressing Flowsheets (Taken 09/18/2021 1018) Pt Will Perform Lower Body Dressing:  with min assist  with mod assist  sitting/lateral leans  with adaptive equipment Goal: Pt. Will Transfer To Toilet Flowsheets (Taken 09/18/2021 1018) Pt Will Transfer to Toilet:  with mod assist  stand pivot transfer  with transfer board  bedside commode Goal: Pt/Caregiver Will Perform Home Exercise Program Flowsheets (Taken 09/18/2021 1018) Pt/caregiver will Perform Home Exercise Program:  Increased strength  Both right and left upper extremity  Independently  Shamira Toutant OT, MOT

## 2021-09-18 NOTE — Consult Note (Addendum)
Reason for Consult: To manage dialysis and dialysis related needs Referring Physician: [place]   [name] is an 55 y.o. female with many medical issues  ESRD,  HTN, COPD, DM, lymphedema with chronic thigh wound, obesity-  coming off a 2 month hospitalization at [facility]-  discharged on 9/5 ( was rec she go to SNF but she declined).  Has not been to HD since Friday because did not feel well-  presented yesterday with volume overload and high K-  has this continued thigh wound that requires attention as well.  Seen on HD -  said that she had bad leg pain and nausea and vomiting and that is why she could not make it to HD-  says dry heaves here -  has not been started on abx as exam is not ominous  Dialyzes at [facility]-  MWF-  has not been there since last Friday  EDW 122.5/  4 hours and 15 minutes/AVF-  15 gauge/1K bath/2.5 calc/BFR 300. Calcitriol 0.5 q treatment -  no ESA-  on last Friday left out at 121.8-  heparin 1000 bolus and q hour   Past Medical History:  Diagnosis Date   Allergy    Phreesia 11/04/2020   Anemia, unspecified    Anxiety    Arthritis    Phreesia 11/04/2020   Asthma    as a child   Cancer ([place])    thyroid   Cellulitis    CHF (congestive heart failure) ([place])    Chiari malformation    s/p surgery   Chiari malformation    Chronic kidney disease    Phreesia 11/04/2020   Chronic kidney disease, stage 5 (HCC)    Chronic obstructive pulmonary disease, unspecified ([place])    Chronic pain    Complication of anesthesia 11/28/15   Resp arrest after  conscious  sedation   Compression of brain (HCC)    Constipation    COPD (chronic obstructive pulmonary disease) ([place])    Phreesia 11/04/2020   Coronary artery disease    40-50% mid LAD 04/29/09, Medical tx. (Dr. Gwenlyn Found)   Diabetes mellitus    Type II- reports being off all medication d/t it being controlled   Diabetes mellitus without complication ([place])    Phreesia 11/04/2020   Essential (primary) hypertension     Fibromyalgia    Fibromyalgia    History of blood transfusion    hemorrage duinrg pregancy   Hx of echocardiogram 10/2011   EF 55-60%   Hypercholesterolemia    Hyperlipidemia, unspecified    Hypertension    Hypertensive chronic kidney disease with stage 1 through stage 4 chronic kidney disease, or unspecified chronic kidney disease    Hypertensive chronic kidney disease with stage 5 chronic kidney disease or end stage renal disease (HCC)    Lymph edema    Lymphedema, not elsewhere classified    Morbid (severe) obesity due to excess calories (HCC)    Obesity hypoventilation syndrome ([place])    On home oxygen therapy    "2L; 24/7" (12/21/2018)   Peritonitis, unspecified ([place])    Pneumonia    in past   Pulmonary hypertension ([place])    Renal disorder    M/W/F [facility] started dialysis in Dec.2016   S/P colonoscopy 05/26/2007   Dr. Laural Golden sigmoid diverticulosis random biopsies benign   S/P endoscopy 05/01/2009   Dr. Penelope Coop pill-induced esophageal ulcerations distal to midesophagus, 2 small ulcers in the antrum of the stomach   Shortness of breath dyspnea  with any exertion or if heart rate  is irregular while on dialysis   Sleep apnea    reports that she no longer needs CPAP due to weight loss   Sleep apnea, unspecified    Type 2 diabetes mellitus with diabetic nephropathy ([place])    Type 2 diabetes mellitus with hyperglycemia [facility])     Past Surgical History:  Procedure Laterality Date   .Hemodialysis catheter Right 11/28/2015   A/V FISTULAGRAM N/A 07/26/2017   Procedure: A/V Fistulagram - Left Arm;  Surgeon: Serafina Mitchell, MD;  Location: [place] [facility];  Service: Cardiovascular;  Laterality: N/A;   ABDOMINAL HYSTERECTOMY     total hysterectomy   ADENOIDECTOMY     APPLICATION OF WOUND VAC Left 08/19/2021   Procedure: APPLICATION OF WOUND VAC;  Surgeon: Wallace Going, DO;  Location: [facility];  Service: Plastics;  Laterality: Left;   AV FISTULA PLACEMENT Left  03/15/2016   Procedure: CREATION OF LEFT ARM ARTERIOVENOUS (AV) FISTULA  ;  Surgeon: Rosetta Posner, MD;  Location: [facility];  Service: Vascular;  Laterality: Left;   CAPD INSERTION N/A 08/30/2017   Procedure: LAPAROSCOPIC INSERTION CONTINUOUS AMBULATORY PERITONEAL DIALYSIS  (CAPD) CATHETER;  Surgeon: Clovis Riley, MD;  Location: [facility];  Service: General;  Laterality: N/A;   CARDIOVERSION N/A 07/03/2021   Procedure: CARDIOVERSION;  Surgeon: Larey Dresser, MD;  Location: [facility] ENDOSCOPY;  Service: Cardiovascular;  Laterality: N/A;   CESAREAN SECTION      x 2   CESAREAN SECTION N/A    Phreesia 11/04/2020   CRANIECTOMY SUBOCCIPITAL W/ CERVICAL LAMINECTOMY / CHIARI     FISTULA SUPERFICIALIZATION Left 05/10/2016   Procedure: Left Arm FISTULA SUPERFICIALIZATION;  Surgeon: Rosetta Posner, MD;  Location: [facility];  Service: Vascular;  Laterality: Left;   I & D EXTREMITY Left 08/19/2021   Procedure: Debridement of left thigh wound;  Surgeon: Wallace Going, DO;  Location: [facility];  Service: Plastics;  Laterality: Left;   IR GENERIC HISTORICAL  08/14/2016   IR REMOVAL TUN ACCESS W/ PORT W/O FL MOD SED 08/14/2016 Arne Cleveland, MD [facility]-INTERV RAD   IR SINUS/FIST TUBE CHK-NON GI  01/01/2019   IR THROMBECTOMY AV FISTULA W/THROMBOLYSIS INC/SHUNT/IMG LEFT Left 08/13/2021   IR US GUIDE BX ASP/DRAIN  12/28/2018   IR US GUIDE VASC ACCESS LEFT  08/13/2021   MINOR REMOVAL OF PERITONEAL DIALYSIS CATHETER N/A 04/26/2019   Procedure: REMOVAL OF INFECTED PERITONEAL DIALYSIS CATHETER;  Surgeon: Erroll Luna, MD;  Location: [facility] [place];  Service: General;  Laterality: N/A;   PERIPHERAL VASCULAR BALLOON ANGIOPLASTY Left 10/18/2019   Procedure: PERIPHERAL VASCULAR BALLOON ANGIOPLASTY;  Surgeon: Marty Heck, MD;  Location: [place] [facility];  Service: Cardiovascular;  Laterality: Left;  arm fistula   PERIPHERAL VASCULAR CATHETERIZATION Left 11/25/2016   Procedure: A/V Fistulagram;  Surgeon: Conrad El Portal, MD;  Location: [facility] [facility];  Service: Cardiovascular;  Laterality: Left;  arm   PORTACATH PLACEMENT  07/05/2012   Procedure: INSERTION PORT-A-CATH;  Surgeon: Donato Heinz, MD;  Location: [facility];  Service: General;  Laterality: Left;  subclavian   portacath removal     RIGHT HEART CATH N/A 08/04/2017   Procedure: RIGHT HEART CATH;  Surgeon: Jolaine Artist, MD;  Location: [facility];  Service: Cardiovascular;  Laterality: N/A;   RIGHT HEART CATH N/A 07/02/2021   Procedure: RIGHT HEART CATH;  Surgeon: Larey Dresser, MD;  Location: [facility];  Service:  Cardiovascular;  Laterality: N/A;   TEE WITHOUT CARDIOVERSION N/A 07/03/2021   Procedure: TRANSESOPHAGEAL ECHOCARDIOGRAM (TEE);  Surgeon: Larey Dresser, MD;  Location: [facility] ENDOSCOPY;  Service: Cardiovascular;  Laterality: N/A;   THYROIDECTOMY, PARTIAL     TONSILLECTOMY      Family History  Problem Relation Age of Onset   Colon cancer Mother 58   Stroke Mother 28   Coronary artery disease Mother     Social History:  reports that she has never smoked. She has never used smokeless tobacco. She reports that she does not drink alcohol and does not use drugs.  Allergies:  Allergies  Allergen Reactions   Contrast Media [Iodinated Diagnostic Agents] Anaphylaxis, Hives, Swelling and Other (See Comments)    Dye for cardiac cath. Tongue swells   Pneumococcal Vaccines Swelling and Other (See Comments)    Turns skin black, and bodily swelling   Vancomycin Nausea And Vomiting and Other (See Comments)    Infusion "made me feel like I was dying" had to be readmitted to hospital    Medications: I have reviewed the patient's current medications.  Results for orders placed or performed during the hospital encounter of 09/17/21 (from the past 48 hour(s))  Comprehensive metabolic panel     Status: Abnormal   Collection Time: 09/17/21  1:25 PM  Result Value Ref Range   Sodium 127 (L) 135 - 145 mmol/L   Potassium 5.9 (H) 3.5 - 5.1 mmol/L    Chloride 88 (L) 98 - 111 mmol/L   CO2 21 (L) 22 - 32 mmol/L   Glucose, Bld 106 (H) 70 - 99 mg/dL    Comment: Glucose reference range applies only to samples taken after fasting for at least 8 hours.   BUN 105 (H) 6 - 20 mg/dL    Comment: RESULTS CONFIRMED BY MANUAL DILUTION   Creatinine, Ser 8.88 (H) 0.44 - 1.00 mg/dL   Calcium 8.2 (L) 8.9 - 10.3 mg/dL   Total Protein 7.8 6.5 - 8.1 g/dL   Albumin 3.3 (L) 3.5 - 5.0 g/dL   AST 15 15 - 41 U/L   ALT 16 0 - 44 U/L   Alkaline Phosphatase 254 (H) 38 - 126 U/L   Total Bilirubin 1.2 0.3 - 1.2 mg/dL   GFR, Estimated 5 (L) >60 mL/min    Comment: (NOTE) Calculated using the CKD-EPI Creatinine Equation (2021)    Anion gap 18 (H) 5 - 15    Comment: Performed at [facility], [address] [place], [place] [address]  CBC with Differential     Status: Abnormal   Collection Time: 09/17/21  1:25 PM  Result Value Ref Range   WBC 10.4 4.0 - 10.5 K/uL   RBC 3.34 (L) 3.87 - 5.11 MIL/uL   Hemoglobin 10.0 (L) 12.0 - 15.0 g/dL   HCT 30.9 (L) 36.0 - 46.0 %   MCV 92.5 80.0 - 100.0 fL   MCH 29.9 26.0 - 34.0 pg   MCHC 32.4 30.0 - 36.0 g/dL   RDW 19.8 (H) 11.5 - 15.5 %   Platelets 295 150 - 400 K/uL   nRBC 0.0 0.0 - 0.2 %   Neutrophils Relative % 83 %   Neutro Abs 8.7 (H) 1.7 - 7.7 K/uL   Lymphocytes Relative 10 %   Lymphs Abs 1.0 0.7 - 4.0 K/uL   Monocytes Relative 5 %   Monocytes Absolute 0.5 0.1 - 1.0 K/uL   Eosinophils Relative 1 %   Eosinophils Absolute 0.1 0.0 - 0.5  K/uL   Basophils Relative 0 %   Basophils Absolute 0.0 0.0 - 0.1 K/uL   Immature Granulocytes 1 %   Abs Immature Granulocytes 0.10 (H) 0.00 - 0.07 K/uL    Comment: Performed at [facility], [address] [place], [place] [address]  Brain natriuretic peptide     Status: Abnormal   Collection Time: 09/17/21  1:25 PM  Result Value Ref Range   B Natriuretic Peptide 762.0 (H) 0.0 - 100.0 pg/mL    Comment: Performed at [facility], [address] [place], [place] [address]   Lactic acid, plasma     Status: None   Collection Time: 09/17/21  1:25 PM  Result Value Ref Range   Lactic Acid, Venous 1.3 0.5 - 1.9 mmol/L    Comment: Performed at [facility], [address], [place], [place] [address]  Blood culture (routine x 2)     Status: None (Preliminary result)   Collection Time: 09/17/21  1:25 PM   Specimen: Right Antecubital; Blood  Result Value Ref Range   Specimen Description RIGHT ANTECUBITAL    Special Requests      Blood Culture adequate volume BOTTLES DRAWN AEROBIC AND ANAEROBIC   Culture      NO GROWTH < 24 HOURS Performed at [facility], [address] [place], [place] [address]    Report Status PENDING   Magnesium     Status: None   Collection Time: 09/17/21  1:25 PM  Result Value Ref Range   Magnesium 2.2 1.7 - 2.4 mg/dL    Comment: Performed at [facility], [address], [place], [place] [address]  Resp Panel by RT-PCR (Flu A&B, Covid) Nasopharyngeal Swab     Status: None   Collection Time: 09/17/21  1:27 PM   Specimen: Nasopharyngeal Swab; Nasopharyngeal(NP) swabs in vial transport medium  Result Value Ref Range   SARS Coronavirus 2 by RT PCR NEGATIVE NEGATIVE    Comment: (NOTE) SARS-CoV-2 target nucleic acids are NOT DETECTED.  The SARS-CoV-2 RNA is generally detectable in upper respiratory specimens during the acute phase of infection. The lowest concentration of SARS-CoV-2 viral copies this assay can detect is 138 copies/mL. A negative result does not preclude SARS-Cov-2 infection and should not be used as the sole basis for treatment or other patient management decisions. A negative result may occur with  improper specimen collection/handling, submission of specimen other than nasopharyngeal swab, presence of viral mutation(s) within the areas targeted by this assay, and inadequate number of viral copies(<138 copies/mL). A negative result must be combined with clinical observations, patient history, and  epidemiological information. The expected result is Negative.  Fact Sheet for Patients:  EntrepreneurPulse.com.au  Fact Sheet for Healthcare Providers:  IncredibleEmployment.be  This test is no t yet approved or cleared by the [place] FDA and  has been authorized for detection and/or diagnosis of SARS-CoV-2 by FDA under an Emergency Use Authorization (EUA). This EUA will remain  in effect (meaning this test can be used) for the duration of the COVID-19 declaration under Section 564(b)(1) of the Act, 21 U.S.C.section 360bbb-3(b)(1), unless the authorization is terminated  or revoked sooner.       Influenza A by PCR NEGATIVE NEGATIVE   Influenza B by PCR NEGATIVE NEGATIVE    Comment: (NOTE) The Xpert Xpress SARS-CoV-2/FLU/RSV plus assay is intended as an aid in the diagnosis of influenza from Nasopharyngeal swab specimens and should not be used as a sole basis for treatment. Nasal washings and aspirates are unacceptable  for Xpert Xpress SARS-CoV-2/FLU/RSV testing.  Fact Sheet for Patients: EntrepreneurPulse.com.au  Fact Sheet for Healthcare Providers: IncredibleEmployment.be  This test is not yet approved or cleared by the [place] FDA and has been authorized for detection and/or diagnosis of SARS-CoV-2 by FDA under an Emergency Use Authorization (EUA). This EUA will remain in effect (meaning this test can be used) for the duration of the COVID-19 declaration under Section 564(b)(1) of the Act, 21 U.S.C. section 360bbb-3(b)(1), unless the authorization is terminated or revoked.  Performed at [facility], [address], [place], [place] [address]   Blood culture (routine x 2)     Status: None (Preliminary result)   Collection Time: 09/17/21  1:30 PM   Specimen: BLOOD  Result Value Ref Range   Specimen Description BLOOD    Special Requests NONE    Culture      NO GROWTH < 24  HOURS Performed at [facility], [address] [place], [place] [address]    Report Status PENDING   Lactic acid, plasma     Status: None   Collection Time: 09/17/21  2:33 PM  Result Value Ref Range   Lactic Acid, Venous 1.3 0.5 - 1.9 mmol/L    Comment: Performed at [facility], [address] [place], [place] [address]  Type and screen [facility]     Status: None   Collection Time: 09/17/21  3:26 PM  Result Value Ref Range   ABO/RH(D) B POS    Antibody Screen NEG    Sample Expiration      09/20/2021,2359 Performed at [facility], [address] [place], [place] [address]   ABO/Rh     Status: None   Collection Time: 09/17/21  4:14 PM  Result Value Ref Range   ABO/RH(D)      B POS Performed at [facility], [address], [place], [place] [address]   Comprehensive metabolic panel     Status: Abnormal   Collection Time: 09/18/21  6:20 AM  Result Value Ref Range   Sodium 130 (L) 135 - 145 mmol/L   Potassium 6.3 (HH) 3.5 - 5.1 mmol/L    Comment: CRITICAL RESULT CALLED TO, READ BACK BY AND VERIFIED WITH: PENG,M AT 7:10AM ON 09/18/21 BY FESTERMAN,C    Chloride 90 (L) 98 - 111 mmol/L   CO2 20 (L) 22 - 32 mmol/L   Glucose, Bld 79 70 - 99 mg/dL    Comment: Glucose reference range applies only to samples taken after fasting for at least 8 hours.   BUN 114 (H) 6 - 20 mg/dL    Comment: RESULTS CONFIRMED BY MANUAL DILUTION   Creatinine, Ser 9.30 (H) 0.44 - 1.00 mg/dL   Calcium 8.1 (L) 8.9 - 10.3 mg/dL   Total Protein 7.3 6.5 - 8.1 g/dL   Albumin 3.2 (L) 3.5 - 5.0 g/dL   AST 15 15 - 41 U/L   ALT 14 0 - 44 U/L   Alkaline Phosphatase 223 (H) 38 - 126 U/L   Total Bilirubin 1.5 (H) 0.3 - 1.2 mg/dL   GFR, Estimated 5 (L) >60 mL/min    Comment: (NOTE) Calculated using the CKD-EPI Creatinine Equation (2021)    Anion gap 20 (H) 5 - 15    Comment: Performed at [facility], [address] [place],  [address]  CBC     Status: Abnormal   Collection Time: 09/18/21  6:20  AM  Result Value Ref Range   WBC 9.9 4.0 - 10.5 K/uL  RBC 2.94 (L) 3.87 - 5.11 MIL/uL   Hemoglobin 9.0 (L) 12.0 - 15.0 g/dL   HCT 26.8 (L) 36.0 - 46.0 %   MCV 91.2 80.0 - 100.0 fL   MCH 30.6 26.0 - 34.0 pg   MCHC 33.6 30.0 - 36.0 g/dL   RDW 19.3 (H) 11.5 - 15.5 %   Platelets 298 150 - 400 K/uL   nRBC 0.2 0.0 - 0.2 %    Comment: Performed at [facility], [address] [place], [place] [address]  Protime-INR     Status: Abnormal   Collection Time: 09/18/21  6:20 AM  Result Value Ref Range   Prothrombin Time 18.6 (H) 11.4 - 15.2 seconds   INR 1.6 (H) 0.8 - 1.2    Comment: (NOTE) INR goal varies based on device and disease states. Performed at [facility], [address], [place], [place] [address]   APTT     Status: None   Collection Time: 09/18/21  6:20 AM  Result Value Ref Range   aPTT 32 24 - 36 seconds    Comment: Performed at [facility], [address] [place], [place] [address]  Magnesium     Status: None   Collection Time: 09/18/21  6:20 AM  Result Value Ref Range   Magnesium 2.2 1.7 - 2.4 mg/dL    Comment: Performed at [facility], [address], [place], [place] [address]  Phosphorus     Status: Abnormal   Collection Time: 09/18/21  6:20 AM  Result Value Ref Range   Phosphorus 7.4 (H) 2.5 - 4.6 mg/dL    Comment: Performed at [facility], [address], [place], [place] [address]    DG Chest 1 View  Result Date: 09/17/2021 CLINICAL DATA:  Edema, large wound to left upper medial thigh EXAM: CHEST  1 VIEW COMPARISON:  Chest radiograph 07/01/2021 FINDINGS: The heart is markedly enlarged, unchanged. The mediastinal contours are stable. There is vascular congestion with suspected mild pulmonary interstitial edema. There is no focal airspace disease. There is no significant pleural effusion. There is no pneumothorax. There is no acute osseous abnormality. IMPRESSION: Marked cardiomegaly with mild pulmonary interstitial edema. Electronically Signed   By: Valetta Mole  M.D.   On: 09/17/2021 14:56   DG FEMUR MIN 2 VIEWS LEFT  Result Date: 09/17/2021 CLINICAL DATA:  Wound infection of left thigh EXAM: LEFT FEMUR 2 VIEWS COMPARISON:  Femur radiographs 09/11/2015, CT femur 07/15/2021 FINDINGS: There is no evidence of acute osseous abnormality. There is no erosive or destructive change. There is degenerative change about the knee. No soft tissue gas is identified. IMPRESSION: No acute osseous abnormality or soft tissue gas identified. Electronically Signed   By: Valetta Mole M.D.   On: 09/17/2021 14:58    ROS: left thigh wound pain, nausea and vomiting, edema Blood pressure (!) 133/48, pulse (!) 103, temperature 97.7 F (36.5 C), temperature source Oral, resp. rate 18, height _0  (1.651 m), weight 125.2 kg, SpO2 92 %. General appearance: alert, cooperative, and no distress Resp: diminished breath sounds bibasilar Cardio: regular rate and rhythm, S1, S2 normal, no murmur, click, rub or gallop GI: abnormal findings:  obese Extremities: edema 1-2+ AVF with good thrill and bruit         shows me picture of thigh wound-  granulation tissue but also some yellow appearing tissue-  no erythema-  some yellow drainage   Assessment/Plan: 55 year old BF with ESRD, many other problems including lymphedema and chronic thigh  wound who presents to hospital with increased thigh wound pain/nausea and missed HD  1 thigh wound-  per primary-  local wound care planned  2 ESRD: normally MWF via AVF-  has not had treatment since 9/23.  Will plan for today - UF as able and 1 k bath.  Next treatment planned for Monday 3 Hypertension: is overloaded but hypotensive -  midodrine and albumin with UF as able 4. Anemia of ESRD: not on ESA as OP?  Hgb 9-  will add 5. Metabolic Bone Disease: continue calcitriol 0.5 TIW 6. Hyperkalemia -  seems to be a chronic issue-  on 1 K bath as OP-  given lokelma and bicarb but went up-  1 k bath today in HD   Pt will not be physically seen over weekend-   will order labs over weekend to check on k and they will be reviewed-  will be seen again and have dialysis on Monday-  call with questions   Louis Meckel 09/18/2021, 9:35 AM

## 2021-09-18 NOTE — Progress Notes (Addendum)
*  PRELIMINARY RESULTS* Echocardiogram 2D Echocardiogram has been performed with Definity.  Elpidio Anis 09/18/2021, 4:19 PM

## 2021-09-18 NOTE — Procedures (Signed)
Patient was seen on dialysis and the procedure was supervised.  BFR 400  Via AVF BP is  98/83.   Patient appears to be tolerating treatment well  Louis Meckel 09/18/2021

## 2021-09-18 NOTE — Procedures (Signed)
   HEMODIALYSIS TREATMENT NOTE:   Uneventful 3.5 hour low-heparin HD completed via left forearm AVF (15g/antegrade).  Soft blood pressures dictated lower goal range, despite Albumin.  Net UF 2 liters.  All blood was returned and hemostasis was achieved in 15 minutes.  No changes from pre-HD assessment.  Rockwell Alexandria, RN     07 Sep 2021  Hepatitis B virus surface Ag:PrThr:Pt:Ser/Plas:Ord:IA:  NEG    NEG

## 2021-09-18 NOTE — Plan of Care (Signed)
  Problem: Acute Rehab PT Goals(only PT should resolve) Goal: Pt Will Go Supine/Side To Sit Outcome: Progressing Flowsheets (Taken 09/18/2021 1443) Pt will go Supine/Side to Sit: with moderate assist Goal: Patient Will Transfer Sit To/From Stand Outcome: Progressing Flowsheets (Taken 09/18/2021 1443) Patient will transfer sit to/from stand: with moderate assist Goal: Pt Will Transfer Bed To Chair/Chair To Bed Outcome: Progressing Flowsheets (Taken 09/18/2021 1443) Pt will Transfer Bed to Chair/Chair to Bed:  with mod assist  with max assist Goal: Pt Will Ambulate Outcome: Progressing Flowsheets (Taken 09/18/2021 1443) Pt will Ambulate:  10 feet  with moderate assist  with rolling walker   2:44 PM, 09/18/21 Lonell Grandchild, MPT Physical Therapist with [facility] [phone] office [phone] mobile phone

## 2021-09-18 NOTE — Progress Notes (Signed)
Date and time results received: 09/18/21 0730  Test: potassium Critical Value: 6.3  Name of Provider Notified: E. [place]  Orders Received? Or Actions Taken?: Orders Received - See Orders for details

## 2021-09-18 NOTE — Plan of Care (Signed)
  Problem: Clinical Measurements: Goal: Respiratory complications will improve Outcome: Progressing

## 2021-09-18 NOTE — Telephone Encounter (Signed)
Patient called to let us know that she has been admitted to the hospital and to cancel her appointment for 09/22/2021.

## 2021-09-18 NOTE — Evaluation (Addendum)
Occupational Therapy Evaluation Patient Details Name: [name] MRN: [id number] DOB: March 10, 1966 Today's Date: 09/18/2021   History of Present Illness [name] is a 55 y.o. female with medical history significant for hypertension, CHF, ESRD on HD (MWF), COPD, type 2 diabetes mellitus, chronic lymphedema, obesity who presents to the emergency department due to worsening left thigh wound infection and missed dialysis sessions.  Patient was admitted on 7/3 and discharged on 9/5 (64 days) due to left medial thigh wound during which she underwent I&D on 8/31 by plastic surgery and wound VAC was placed, and she also had septic shock and CHF exacerbation.  She was advised to be discharged to a SNF, but patient preferred to go home.  She was seen on 9/9 in the ED due to concern for left thigh wound infection after being advised by her wound care nurse.  Patient complained of worsening pain and purulent with foul smelling discharge from the wound site, pain was of moderate intensity, it was constant and was without any radiation.  Her wound care nurse recommended that she goes to the ED for further evaluation and management.  She denies fever, chills, chest pain, shortness of breath.  Patient also endorsed missing 2 dialysis sessions with last session being on Friday (9/23).  Patient states that she missed Monday and Wednesday because she was mostly bedbound and she did not have any assistance to help with going to the dialysis center.   Clinical Impression   Pt agreeable to OT/PT co-evaluation. Pt reports 5/10 pain in L thigh prior to movement at which point she appeared to be at an 8/10 or more via faces scale due to grimacing, moaning, and guarding during bed mobility. Pt reports she has not ambulated since July. Pt uses a lift at home for transfers. Pt demonstrates WFL A/ROM with generalized weakness observed via slow labored movement during bed mobility supine to sit with HOB elevated with mod to  max A. Pt limited to bed mobility due to pain in L thigh. Pt will benefit from continued OT in the hospital and recommended venue below to increase strength, balance, and endurance for safe ADL's.        Recommendations for follow up therapy are one component of a multi-disciplinary discharge planning process, led by the attending physician.  Recommendations may be updated based on patient status, additional functional criteria and insurance authorization.   Follow Up Recommendations  SNF    Equipment Recommendations  None recommended by OT           Precautions / Restrictions Precautions Precautions: Fall Restrictions Weight Bearing Restrictions: No      Mobility Bed Mobility Overal bed mobility: Needs Assistance Bed Mobility: Supine to Sit;Sit to Supine     Supine to sit: Mod assist;Max assist Sit to supine: Mod assist;Max assist   General bed mobility comments: Slow labored movement with extended time with pt reports of pain in L thigh throughout the task.    Transfers                      Balance Overall balance assessment: Needs assistance Sitting-balance support: Feet supported;No upper extremity supported Sitting balance-Leahy Scale: Fair Sitting balance - Comments: fair seated at EOB                                   ADL either performed or assessed with clinical judgement  ADL Overall ADL's : Needs assistance/impaired                     Lower Body Dressing: Total assistance;Bed level Lower Body Dressing Details (indicate cue type and reason): total assist to don socks at bed level               General ADL Comments: Pt requires mod to max A for bed mobility. Sit to stand not trialed due to pt pain.     Vision Baseline Vision/History: 0 No visual deficits Ability to See in Adequate Light: 0 Adequate Patient Visual Report: No change from baseline Vision Assessment?: No apparent visual deficits                 Pertinent Vitals/Pain Pain Assessment: Faces Faces Pain Scale: Hurts whole lot Pain Location: L thigh Pain Descriptors / Indicators: Burning;Grimacing;Guarding Pain Intervention(s): Limited activity within patient's tolerance;Monitored during session;Repositioned     Hand Dominance Right   Extremity/Trunk Assessment Upper Extremity Assessment Upper Extremity Assessment: Generalized weakness (WFL A/ROM)   Lower Extremity Assessment Lower Extremity Assessment: Defer to PT evaluation       Communication Communication Communication: No difficulties   Cognition Arousal/Alertness: Awake/alert Behavior During Therapy: WFL for tasks assessed/performed Overall Cognitive Status: Within Functional Limits for tasks assessed                                                      Home Living Family/patient expects to be discharged to:: Private residence Living Arrangements: Spouse/significant other;Children Available Help at Discharge: Family;Available 24 hours/day Type of Home: House Home Access: Ramped entrance     Home Layout: One level     Bathroom Shower/Tub: Teacher, early years/pre: Standard Bathroom Accessibility: No   Home Equipment: Wheelchair - power;Wheelchair - manual;Cane - single point;Walker - 2 wheels;Other (comment) (hoyer lift)   Additional Comments: Pt takes sponge baths and uses a bed pan at this time.      Prior Functioning/Environment Level of Independence: Needs assistance  Gait / Transfers Assistance Needed: Pt reports being bed bound recently with last date of ambulation being in July. Pt reports use of hoyer lift at home for transfers. ADL's / Homemaking Assistance Needed: Assisted for ADL's and IADL's.            OT Problem List: Decreased strength;Decreased activity tolerance;Impaired balance (sitting and/or standing);Obesity;Pain      OT Treatment/Interventions: Self-care/ADL training;Therapeutic  exercise;Patient/family education;Balance training;Therapeutic activities    OT Goals(Current goals can be found in the care plan section) Acute Rehab OT Goals Patient Stated Goal: return home OT Goal Formulation: With patient Time For Goal Achievement: 10/02/21 Potential to Achieve Goals: Fair  OT Frequency: Min 2X/week               Co-evaluation PT/OT/SLP Co-Evaluation/Treatment: Yes Reason for Co-Treatment: Complexity of the patient's impairments (multi-system involvement)   OT goals addressed during session: ADL's and self-care;Strengthening/ROM                       End of Session    Activity Tolerance: Patient limited by pain Patient left: in bed;with call bell/phone within reach  OT Visit Diagnosis: Unsteadiness on feet (R26.81);Pain;Muscle weakness (generalized) (M62.81) Pain - Right/Left: Left Pain - part of body:  (thigh)  Time: 4481-8563 OT Time Calculation (min): 20 min Charges:  OT General Charges $OT Visit: 1 Visit OT Evaluation $OT Eval Moderate Complexity: 1 Mod  Jevaun Strick OT, MOT  [employer] 09/18/2021, 10:15 AM

## 2021-09-18 NOTE — Telephone Encounter (Signed)
PCS forms  Noted  Copied  Sleeved  

## 2021-09-18 NOTE — Evaluation (Signed)
Physical Therapy Evaluation Patient Details Name: [name] MRN: [id number] DOB: 08-Sep-1966 Today's Date: 09/18/2021  History of Present Illness  [name] is a 55 y.o. female with medical history significant for hypertension, CHF, ESRD on HD (MWF), COPD, type 2 diabetes mellitus, chronic lymphedema, obesity who presents to the emergency department due to worsening left thigh wound infection and missed dialysis sessions.  Patient was admitted on 7/3 and discharged on 9/5 (64 days) due to left medial thigh wound during which she underwent I&D on 8/31 by plastic surgery and wound VAC was placed, and she also had septic shock and CHF exacerbation.  She was advised to be discharged to a SNF, but patient preferred to go home.  She was seen on 9/9 in the ED due to concern for left thigh wound infection after being advised by her wound care nurse.  Patient complained of worsening pain and purulent with foul smelling discharge from the wound site, pain was of moderate intensity, it was constant and was without any radiation.  Her wound care nurse recommended that she goes to the ED for further evaluation and management.  She denies fever, chills, chest pain, shortness of breath.  Patient also endorsed missing 2 dialysis sessions with last session being on Friday (9/23).  Patient states that she missed Monday and Wednesday because she was mostly bedbound and she did not have any assistance to help with going to the dialysis center.   Clinical Impression  Patient demonstrates slow labored movement for sitting up at bedside requiring Mod/max assist to move LLE during supine to sitting and Max assist when getting into bed, unable to attempt sit to stands or transfers due to c/o severe pain with any movement of LLE.  Patient demonstrates fair/good return for using BUE and RLE to assist with repositioning self in bed.  Patient will benefit from continued physical therapy in hospital and recommended venue  below to increase strength, balance, endurance for safe ADLs and gait.         Recommendations for follow up therapy are one component of a multi-disciplinary discharge planning process, led by the attending physician.  Recommendations may be updated based on patient status, additional functional criteria and insurance authorization.  Follow Up Recommendations SNF;Supervision for mobility/OOB;Supervision - Intermittent    Equipment Recommendations  None recommended by PT    Recommendations for Other Services       Precautions / Restrictions Precautions Precautions: Fall Restrictions Weight Bearing Restrictions: No      Mobility  Bed Mobility Overal bed mobility: Needs Assistance Bed Mobility: Supine to Sit;Sit to Supine     Supine to sit: Mod assist;Max assist;Independent Sit to supine: Mod assist;Max assist   General bed mobility comments: Requried Mod/max assist to move LLE due to increased pain, HOB raised, Max assist to move LLE when getting back into bed    Transfers                    Ambulation/Gait                Stairs            Wheelchair Mobility    Modified Rankin (Stroke Patients Only)       Balance Overall balance assessment: Needs assistance Sitting-balance support: Feet supported Sitting balance-Leahy Scale: Fair Sitting balance - Comments: fair seated at EOB  Pertinent Vitals/Pain Pain Assessment: No/denies pain Pain Location: L thigh Pain Descriptors / Indicators: Burning;Grimacing;Guarding Pain Intervention(s): Limited activity within patient's tolerance;Monitored during session;Repositioned    Home Living Family/patient expects to be discharged to:: Private residence Living Arrangements: Spouse/significant other;Children Available Help at Discharge: Family;Available 24 hours/day Type of Home: House Home Access: Ramped entrance     Home Layout: One  level Home Equipment: Wheelchair - power;Wheelchair - manual;Cane - single point;Walker - 2 wheels;Other (comment) Additional Comments: Pt takes sponge baths and uses a bed pan at this time.    Prior Function Level of Independence: Needs assistance   Gait / Transfers Assistance Needed: Pt reports being bed bound recently with last date of ambulation being in July. Pt reports use of hoyer lift at home for transfers.  ADL's / Homemaking Assistance Needed: Assisted for ADL's and IADL's.        Hand Dominance   Dominant Hand: Right    Extremity/Trunk Assessment   Upper Extremity Assessment Upper Extremity Assessment: Defer to OT evaluation    Lower Extremity Assessment Lower Extremity Assessment: Generalized weakness;RLE deficits/detail;LLE deficits/detail RLE Deficits / Details: grossly 4/5 RLE Sensation: WNL RLE Coordination: WNL LLE Deficits / Details: grossly 2+/5 LLE: Unable to fully assess due to pain LLE Sensation: WNL LLE Coordination: WNL    Cervical / Trunk Assessment Cervical / Trunk Assessment: Normal  Communication   Communication: No difficulties  Cognition Arousal/Alertness: Awake/alert Behavior During Therapy: WFL for tasks assessed/performed Overall Cognitive Status: Within Functional Limits for tasks assessed                                        General Comments      Exercises     Assessment/Plan    PT Assessment Patient needs continued PT services  PT Problem List Decreased strength;Decreased activity tolerance;Decreased balance;Decreased mobility;Decreased range of motion;Pain       PT Treatment Interventions DME instruction;Gait training;Functional mobility training;Therapeutic activities;Therapeutic exercise;Stair training;Balance training;Wheelchair mobility training;Patient/family education    PT Goals (Current goals can be found in the Care Plan section)  Acute Rehab PT Goals Patient Stated Goal: return home PT Goal  Formulation: With patient Time For Goal Achievement: 10/02/21 Potential to Achieve Goals: Good    Frequency Min 3X/week   Barriers to discharge        Co-evaluation               AM-PAC PT "6 Clicks" Mobility  Outcome Measure Help needed turning from your back to your side while in a flat bed without using bedrails?: A Lot Help needed moving from lying on your back to sitting on the side of a flat bed without using bedrails?: A Lot Help needed moving to and from a bed to a chair (including a wheelchair)?: Total Help needed standing up from a chair using your arms (e.g., wheelchair or bedside chair)?: Total Help needed to walk in hospital room?: Total Help needed climbing 3-5 steps with a railing? : Total 6 Click Score: 8    End of Session   Activity Tolerance: Patient tolerated treatment well;Patient limited by fatigue;Patient limited by pain Patient left: in bed;with call bell/phone within reach Nurse Communication: Mobility status PT Visit Diagnosis: Unsteadiness on feet (R26.81);Other abnormalities of gait and mobility (R26.89);Muscle weakness (generalized) (M62.81)    Time: 8588-5027 PT Time Calculation (min) (ACUTE ONLY): 27 min   Charges:   PT Evaluation $  PT Eval Moderate Complexity: 1 Mod PT Treatments $Therapeutic Activity: 23-37 mins       2:37 PM, 09/18/21 Lonell Grandchild, MPT Physical Therapist with [facility] [phone] office [phone] mobile phone

## 2021-09-18 NOTE — Progress Notes (Signed)
Patients blood pressure 80/60 manually. Covering HSP notified via Amion at this time. Patient appears asymptomatic  she will be monitored as the shift progresses. Order given to hold lopressor 2200 dose

## 2021-09-18 NOTE — Progress Notes (Signed)
PROGRESS NOTE    [name]  SEG:[id number] DOB: 04-26-1966 DOA: 09/17/2021 PCP: Noreene Larsson, NP    Brief Narrative:  [name] is a 55 year old female with past medical history significant for essential hypertension, ESRD on HD (MWF), chronic diastolic congestive heart failure, COPD, type 2 diabetes mellitus, chronic lymphedema, morbid obesity who presents to [facility] ED on 9/29 with worsening left thigh infection, generalized weakness/fatigue in the setting of missed dialysis sessions.  Patient been complaining of worsening pain and purulent foul-smelling discharge from her wound site.  Was seen by her wound care nurse recommended that she be evaluated in the ED.  Denies fever/chills, no chest pain, no shortness of breath.  Patient reports missing 2 dialysis sessions within the last week, last session on Friday, 09/11/2021 due to being bedbound and no assistance with going to the dialysis center.  Patient recently admitted on 7/3 and discharged on 9/5 due to left medial thigh wound drain in which she went I&D on 8/31 by plastic surgery and wound VAC placement.  During the hospitalization she developed septic shock and CHF exacerbation.  She was advised to discharge to SNF but patient refused and subsequently went home.  In the ED, temperature 97.8 F, HR 98, RR 30, BP 142/112, SPO2 100% on room air.  Sodium 129, potassium 5.9, chloride 88, CO2 21, glucose 106, BUN 105, creatinine 8.88.  Magnesium 2.2, AST 15, ALT 16, total bilirubin 1.2.  BNP 262.0.  Lactic acid 1.3.  WBC 10.4, hemoglobin 10.0, platelets 295.  COVID-19 PCR negative.  Influenza A/B PCR negative.  Chest x-ray with marked cardiomegaly with mild pulmonary interstitial edema.  Left femur x-ray with no acute osseous abnormality or soft tissue gas identified.  Wound VAC was removed by EDP with some mild bleeding due to rupture of a superficial vein that was controlled with direct pressure, sutures and wrapped with a  compression wrap.  Vascular surgery was consulted and recommended packing the wound and applying compression wrap and only need for transfer to [facility] if bleeding uncontrolled.  Plastic surgery was also consulted and recommend discontinue the wound VAC and placing Xeroform gauze over the wound and changing dressing daily; with outpatient follow-up.  Nephrologist consulted regarding need for dialysis given hyperkalemia and uremia.  TRH consulted for further evaluation and management.   Assessment & Plan:   Principal Problem:   Open wound of left thigh Active Problems:   Lymphedema of lower extremity   Diabetes mellitus (HCC)   Hyperkalemia   HTN (hypertension)   HLD (hyperlipidemia)   ESRD on dialysis (HCC)   Lymphedema   Obesity, Class III, BMI 40-49.9 (morbid obesity) (HCC)   Hyponatremia   Hypoalbuminemia due to protein-calorie malnutrition (HCC)   Elevated brain natriuretic peptide (BNP) level   Atrial fibrillation, chronic (HCC)   Pulmonary edema   Prolonged QT interval   Elevated alkaline phosphatase in newborn   Hyperkalemia Hyponatremia Uremia Patient presenting to the ED after several missed HD sessions.  Patient dialyzes at [facility] on a Monday/Wednesday/Friday schedule.  Last HD session on Friday 9/23.  On arrival to the ED, patient was noted to have an elevated creatinine of 5.9 with a BUN of 105.  In the ED, patient was given sodium bicarbonate, Lokelma.  Nephrology was consulted and patient underwent hemodialysis on 09/18/2021. --Follow BMP daily --Continue to monitor on telemetry  Acute on chronic diastolic congestive heart failure Patient presenting with mild shortness of breath.  TTE 06/22/2021 with  LVEF 60 to 34%, grade 1 diastolic dysfunction, RVSP 68.9, RA severely dilated, IVC dilated.  Elevated BNP and chest x-ray with pulmonary edema on admission.  Etiology likely secondary to noncompliance with outpatient hemodialysis. --Continue volume management  with HD  Left thigh wound Patient follows with plastic surgery outpatient, Dr. Marla Roe.  Underwent recent incision and drainage on 08/19/2021 with wound VAC placement.  Was recently seen by home health wound nurse for concern of worsening wound.  Patient is afebrile, no leukocytosis, lactic acid within normal limits.  X-ray with no concerning findings of left femur.  Plastic surgery was consulted on admission and reviewed images and recommended discontinuation of wound VAC with Xeroform dressings changed once daily. --Continue dressing changes once daily with Xeroform gauze --Outpatient follow-up with plastic surgery  ESRD on HD --Nephrology following --HD today, next session planned for Monday  Paroxysmal atrial fibrillation --Continue apixaban --Currently holding amiodarone --Start metoprolol tartrate 12.5 mg p.o. twice daily with holding parameters  Prolonged QTC EKG with QTC 509 MS. --Holding amiodarone --Continue monitor on telemetry  Essential hypertension --Restart home metoprolol tartrate 12.5 mg twice daily with holding parameters --Continue monitor blood pressure closely  Type 2 diabetes mellitus Diet controlled at home.  Recent hemoglobin A1c 6.9, well controlled.  Morbid obesity Body mass index is 45.93 kg/m.  Discussed with patient needs for aggressive lifestyle changes/weight loss as this complicates all facets of care.  Outpatient follow-up with PCP.  May benefit from bariatric evaluation outpatient.  Weakness/Fatigue/deconditioning: Recently discharged from [facility] on 08/24/2021, recommended SNF but patient declined.  Discussed with patient's if therapy recommends SNF placement will she consider, she declines once again. --OT recommends SNF --Pending PT evaluation --Anticipate discharge home when medically ready with home health services.    DVT prophylaxis: SCDs Start: 09/17/21 1939   Code Status: Full Code Family Communication: No family present at  bedside this morning  Disposition Plan:  Level of care: Telemetry Status is: Inpatient  Remains inpatient appropriate because:Persistent severe electrolyte disturbances, Ongoing diagnostic testing needed not appropriate for outpatient work up, Unsafe d/c plan, IV treatments appropriate due to intensity of illness or inability to take PO, and Inpatient level of care appropriate due to severity of illness  Dispo: The patient is from: Home              Anticipated d/c is to: Home              Patient currently is not medically stable to d/c.   Difficult to place patient No   Consultants:  Nephrology, Dr. Moshe Cipro Vascular surgery and plastic surgery -discussed with the EDP  Procedures:  Sutures to left thigh wound placed by EDP  Antimicrobials:  None   Subjective: Patient seen examined bedside, resting comfortably.  Currently undergoing HD.  No specific complaints this morning.  Discussed if she thought she would be better managed at skilled nursing facility on discharge, she declined.  No other specific complaints or concerns at this time.  Currently denies headache, no dizziness, no chest pain, no shortness of breath, no abdominal pain, no fever/chills/night sweats, no nausea/vomiting/diarrhea.  No acute events overnight per nursing staff.  Objective: Vitals:   09/18/21 1130 09/18/21 1145 09/18/21 1200 09/18/21 1230  BP: 107/70 102/66 102/64 102/69  Pulse: (!) 120 (!) 118 (!) 120 (!) 123  Resp:      Temp:      TempSrc:      SpO2:      Weight:  Height:        Intake/Output Summary (Last 24 hours) at 09/18/2021 1255 Last data filed at 09/18/2021 1245 Gross per 24 hour  Intake 720 ml  Output 2044 ml  Net -1324 ml   Filed Weights   09/17/21 2300 09/18/21 0457 09/18/21 0900  Weight: 125.2 kg 125.2 kg 125.2 kg    Examination:  General exam: Appears calm and comfortable, obese Respiratory system: Decreased breath sounds bilateral bases, otherwise clear to  auscultation. Respiratory effort normal.  On room air Cardiovascular system: S1 & S2 heard, RRR. No JVD, murmurs, rubs, gallops or clicks. No pedal edema. Gastrointestinal system: Abdomen is nondistended, soft and nontender. No organomegaly or masses felt. Normal bowel sounds heard. Central nervous system: Alert and oriented. No focal neurological deficits. Extremities: Symmetric 5 x 5 power. Skin: Left thigh wound noted, dressing in place with Ace wrap, clean/dry/intact; otherwise no rashes, lesions or ulcers Psychiatry: Judgement and insight appear normal. Mood & affect appropriate.     Data Reviewed: I have personally reviewed following labs and imaging studies  CBC: Recent Labs  Lab 09/17/21 1325 09/18/21 0620  WBC 10.4 9.9  NEUTROABS 8.7*  --   HGB 10.0* 9.0*  HCT 30.9* 26.8*  MCV 92.5 91.2  PLT 295 948   Basic Metabolic Panel: Recent Labs  Lab 09/17/21 1325 09/18/21 0620  NA 127* 130*  K 5.9* 6.3*  CL 88* 90*  CO2 21* 20*  GLUCOSE 106* 79  BUN 105* 114*  CREATININE 8.88* 9.30*  CALCIUM 8.2* 8.1*  MG 2.2 2.2  PHOS  --  7.4*   GFR: Estimated Creatinine Clearance: 9.1 mL/min (A) (by C-G formula based on SCr of 9.3 mg/dL (H)). Liver Function Tests: Recent Labs  Lab 09/17/21 1325 09/18/21 0620  AST 15 15  ALT 16 14  ALKPHOS 254* 223*  BILITOT 1.2 1.5*  PROT 7.8 7.3  ALBUMIN 3.3* 3.2*   No results for input(s): LIPASE, AMYLASE in the last 168 hours. No results for input(s): AMMONIA in the last 168 hours. Coagulation Profile: Recent Labs  Lab 09/18/21 0620  INR 1.6*   Cardiac Enzymes: No results for input(s): CKTOTAL, CKMB, CKMBINDEX, TROPONINI in the last 168 hours. BNP (last 3 results) No results for input(s): PROBNP in the last 8760 hours. HbA1C: No results for input(s): HGBA1C in the last 72 hours. CBG: No results for input(s): GLUCAP in the last 168 hours. Lipid Profile: No results for input(s): CHOL, HDL, LDLCALC, TRIG, CHOLHDL, LDLDIRECT in  the last 72 hours. Thyroid Function Tests: No results for input(s): TSH, T4TOTAL, FREET4, T3FREE, THYROIDAB in the last 72 hours. Anemia Panel: No results for input(s): VITAMINB12, FOLATE, FERRITIN, TIBC, IRON, RETICCTPCT in the last 72 hours. Sepsis Labs: Recent Labs  Lab 09/17/21 1325 09/17/21 1433  LATICACIDVEN 1.3 1.3    Recent Results (from the past 240 hour(s))  Blood culture (routine x 2)     Status: None (Preliminary result)   Collection Time: 09/17/21  1:25 PM   Specimen: Right Antecubital; Blood  Result Value Ref Range Status   Specimen Description RIGHT ANTECUBITAL  Final   Special Requests   Final    Blood Culture adequate volume BOTTLES DRAWN AEROBIC AND ANAEROBIC   Culture   Final    NO GROWTH < 24 HOURS Performed at [facility], [address] [place], [place] [address]    Report Status PENDING  Incomplete  Resp Panel by RT-PCR (Flu A&B, Covid) Nasopharyngeal Swab     Status: None  Collection Time: 09/17/21  1:27 PM   Specimen: Nasopharyngeal Swab; Nasopharyngeal(NP) swabs in vial transport medium  Result Value Ref Range Status   SARS Coronavirus 2 by RT PCR NEGATIVE NEGATIVE Final    Comment: (NOTE) SARS-CoV-2 target nucleic acids are NOT DETECTED.  The SARS-CoV-2 RNA is generally detectable in upper respiratory specimens during the acute phase of infection. The lowest concentration of SARS-CoV-2 viral copies this assay can detect is 138 copies/mL. A negative result does not preclude SARS-Cov-2 infection and should not be used as the sole basis for treatment or other patient management decisions. A negative result may occur with  improper specimen collection/handling, submission of specimen other than nasopharyngeal swab, presence of viral mutation(s) within the areas targeted by this assay, and inadequate number of viral copies(<138 copies/mL). A negative result must be combined with clinical observations, patient history, and  epidemiological information. The expected result is Negative.  Fact Sheet for Patients:  EntrepreneurPulse.com.au  Fact Sheet for Healthcare Providers:  IncredibleEmployment.be  This test is no t yet approved or cleared by the [place] FDA and  has been authorized for detection and/or diagnosis of SARS-CoV-2 by FDA under an Emergency Use Authorization (EUA). This EUA will remain  in effect (meaning this test can be used) for the duration of the COVID-19 declaration under Section 564(b)(1) of the Act, 21 U.S.C.section 360bbb-3(b)(1), unless the authorization is terminated  or revoked sooner.       Influenza A by PCR NEGATIVE NEGATIVE Final   Influenza B by PCR NEGATIVE NEGATIVE Final    Comment: (NOTE) The Xpert Xpress SARS-CoV-2/FLU/RSV plus assay is intended as an aid in the diagnosis of influenza from Nasopharyngeal swab specimens and should not be used as a sole basis for treatment. Nasal washings and aspirates are unacceptable for Xpert Xpress SARS-CoV-2/FLU/RSV testing.  Fact Sheet for Patients: EntrepreneurPulse.com.au  Fact Sheet for Healthcare Providers: IncredibleEmployment.be  This test is not yet approved or cleared by the [place] FDA and has been authorized for detection and/or diagnosis of SARS-CoV-2 by FDA under an Emergency Use Authorization (EUA). This EUA will remain in effect (meaning this test can be used) for the duration of the COVID-19 declaration under Section 564(b)(1) of the Act, 21 U.S.C. section 360bbb-3(b)(1), unless the authorization is terminated or revoked.  Performed at [facility], [address] [place], [place] [address]   Blood culture (routine x 2)     Status: None (Preliminary result)   Collection Time: 09/17/21  1:30 PM   Specimen: BLOOD  Result Value Ref Range Status   Specimen Description BLOOD  Final   Special Requests NONE  Final   Culture    Final    NO GROWTH < 24 HOURS Performed at [facility], [address] [place], [place] [address]    Report Status PENDING  Incomplete         Radiology Studies: DG Chest 1 View  Result Date: 09/17/2021 CLINICAL DATA:  Edema, large wound to left upper medial thigh EXAM: CHEST  1 VIEW COMPARISON:  Chest radiograph 07/01/2021 FINDINGS: The heart is markedly enlarged, unchanged. The mediastinal contours are stable. There is vascular congestion with suspected mild pulmonary interstitial edema. There is no focal airspace disease. There is no significant pleural effusion. There is no pneumothorax. There is no acute osseous abnormality. IMPRESSION: Marked cardiomegaly with mild pulmonary interstitial edema. Electronically Signed   By: Valetta Mole M.D.   On: 09/17/2021 14:56   DG FEMUR MIN 2 VIEWS LEFT  Result Date: 09/17/2021 CLINICAL DATA:  Wound infection of left thigh EXAM: LEFT FEMUR 2 VIEWS COMPARISON:  Femur radiographs 09/11/2015, CT femur 07/15/2021 FINDINGS: There is no evidence of acute osseous abnormality. There is no erosive or destructive change. There is degenerative change about the knee. No soft tissue gas is identified. IMPRESSION: No acute osseous abnormality or soft tissue gas identified. Electronically Signed   By: Valetta Mole M.D.   On: 09/17/2021 14:58        Scheduled Meds:  Chlorhexidine Gluconate Cloth  6 each Topical Q0600   darbepoetin (ARANESP) injection - DIALYSIS  100 mcg Intravenous Q Fri-HD   feeding supplement  237 mL Oral BID BM   [START ON 09/19/2021] influenza vac split quadrivalent PF  0.5 mL Intramuscular Tomorrow-1000   lanthanum  500 mg Oral TID WC   midodrine  10 mg Oral TID WC   Continuous Infusions:  sodium chloride     sodium chloride     albumin human 25 g (09/18/21 0945)     LOS: 1 day    Time spent: 41 minutes spent on chart review, discussion with nursing staff, consultants, updating family and interview/physical exam; more than 50%  of that time was spent in counseling and/or coordination of care.    Leani Myron J [place] DO [facility] Available via Epic secure chat 7am-7pm After these hours, please refer to coverage provider listed on [email or web address] 09/18/2021, 12:55 PM

## 2021-09-18 NOTE — TOC Initial Note (Signed)
Transition of Care [facility]) - Initial/Assessment Note    Patient Details  Name: [name] MRN: [id number] Date of Birth: Aug 08, 1966  Transition of [facility] CM/SW Contact:    Iona Beard, [place] Phone Number: 09/18/2021, 3:54 PM  Clinical Narrative:                 Pt is high risk for readmission. CSW spoke with pt to complete assessment. Pt states that she lives with her husband and daughter. Pt states that she has family assistance to complete her ADLs. Pt uses [employer] for transportation when needed. Pt states that she has had [facility] services in the past. Pt states that she has a cane, walker, manual wheelchair, power wheelchair, hospital bed, and hoyer lift in the home. CSW spoke with pt about PT recommending SNF for pt at D/C. Pt states that she does not wish to go to rehab and plans to return home. Pt states that she has the needed equipment and will agree to [facility] services at D/C but will not go to SNF. TOC to follow.   Expected Discharge Plan: [place] Barriers to Discharge: Continued Medical Work up   Patient Goals and CMS Choice Patient states their goals for this hospitalization and ongoing recovery are:: Return home with [facility] CMS Medicare.gov Compare Post Acute Care list provided to:: Patient Choice offered to / list presented to : Patient  Expected Discharge Plan and Services Expected Discharge Plan: [place] In-house Referral: Clinical Social Work Discharge Planning Services: CM Consult Post Acute Care Choice: [place] arrangements for the past 2 months: Single Family Home                                      Prior Living Arrangements/Services Living arrangements for the past 2 months: Single Family Home Lives with:: Spouse, Adult Children Patient language and need for interpreter reviewed:: Yes Do you feel safe going back to the place where you live?: Yes      Need for Family Participation in Patient Care: Yes  (Comment) Care giver support system in place?: Yes (comment) Current home services: DME (cane, walker, wheelchair, power wheelchair, hospital bed, hoyer lift) Criminal Activity/Legal Involvement Pertinent to Current Situation/Hospitalization: No - Comment as needed  Activities of Daily Living Home Assistive Devices/Equipment: Cane (specify quad or straight), Dentures (specify type), Electric scooter, Wheelchair, Environmental consultant (specify type) ADL Screening (condition at time of admission) Patient's cognitive ability adequate to safely complete daily activities?: Yes Is the patient deaf or have difficulty hearing?: No Does the patient have difficulty seeing, even when wearing glasses/contacts?: No Does the patient have difficulty concentrating, remembering, or making decisions?: No Patient able to express need for assistance with ADLs?: Yes Does the patient have difficulty dressing or bathing?: Yes Independently performs ADLs?: No Communication: Independent Dressing (OT): Needs assistance Is this a change from baseline?: Pre-admission baseline Grooming: Needs assistance Is this a change from baseline?: Pre-admission baseline Feeding: Independent Bathing: Needs assistance Is this a change from baseline?: Pre-admission baseline Toileting: Needs assistance Is this a change from baseline?: Pre-admission baseline In/Out Bed: Needs assistance Is this a change from baseline?: Pre-admission baseline Walks in Home: Needs assistance Is this a change from baseline?: Pre-admission baseline Does the patient have difficulty walking or climbing stairs?: Yes Weakness of Legs: Both Weakness of Arms/Hands: None  Permission Sought/Granted  Emotional Assessment Appearance:: Appears stated age Attitude/Demeanor/Rapport: Engaged Affect (typically observed): Accepting Orientation: : Oriented to Self, Oriented to Place, Oriented to  Time, Oriented to Situation Alcohol / Substance Use: Not  Applicable Psych Involvement: No (comment)  Admission diagnosis:  Dialysis patient, noncompliant ([place]) [Z91.15] Open wound of left thigh [S71.102A] Visit for wound check [Z51.89] Patient Active Problem List   Diagnosis Date Noted   Hyponatremia 09/18/2021   Hypoalbuminemia due to protein-calorie malnutrition ([place]) 09/18/2021   Elevated brain natriuretic peptide (BNP) level 09/18/2021   Atrial fibrillation, chronic ([place]) 09/18/2021   Pulmonary edema 09/18/2021   Prolonged QT interval 09/18/2021   Elevated alkaline phosphatase in newborn 09/18/2021   Open wound of left thigh 09/17/2021   Pulmonary hypertension ([place])    Pressure injury of skin 07/05/2021   Hypotension 06/30/2021   Bilateral calf pain 06/30/2021   Cardiogenic shock (HCC)    Cellulitis, leg 06/20/2021   COVID-19 02/03/2021   Enteritis, enteropathogenic E. coli 07/02/2020   Bacteremia 07/01/2020   Sepsis due to undetermined organism ([place]) 06/30/2020   Anemia, unspecified    Essential (primary) hypertension    Hyperlipidemia, unspecified    Type 2 diabetes mellitus with hyperglycemia (HCC)    Chronic kidney disease, stage 5 ([place])    Chronic obstructive pulmonary disease, unspecified (HCC)    Fibromyalgia    Hypertensive chronic kidney disease with stage 1 through stage 4 chronic kidney disease, or unspecified chronic kidney disease    Hypertensive chronic kidney disease with stage 5 chronic kidney disease or end stage renal disease (HCC)    Lymphedema    Obesity, Class III, BMI 40-49.9 (morbid obesity) ([place])    Peritonitis, unspecified ([place])    Type 2 diabetes mellitus with diabetic nephropathy (HCC)    Abdominal pain 04/20/2019   Peritonitis ([place]) 12/21/2018   Abdominal pain, LLQ 12/21/2018   Influenza B 12/21/2018   SOB (shortness of breath) 06/27/2018   Hypokalemia 06/27/2018   Chronic renal failure    Shock liver 01/24/2017   Chronic right heart failure ([place]) 01/24/2017   Elevated troponin    Flu-like  symptoms 01/18/2017   Fever 01/18/2017   HCAP (healthcare-associated pneumonia) 12/31/2016   Numbness and tingling in right hand 08/13/2016   Sepsis ([place]) 08/11/2016   Staphylococcus epidermidis bacteremia 06/11/2016   ESRD (end stage renal disease) ([place]) 06/06/2016   Chronic cholecystitis 12/09/2015   Protein-calorie malnutrition, severe 11/29/2015   Respiratory arrest ([place]) 11/28/2015   Cellulitis 11/13/2015   Recurrent cellulitis of lower leg 11/13/2015   Chronic diastolic CHF (congestive heart failure) ([place]) 11/13/2015   Chronic respiratory failure with hypoxia ([place]) 11/13/2015   ESRD on dialysis ([place]) 11/13/2015   Anemia of chronic disease 11/13/2015   Rotator cuff syndrome of right shoulder 05/30/2013   Pain in joint, shoulder region 05/30/2013   Obesity hypoventilation syndrome ([place]) 09/04/2012   Hyperkalemia 11/11/2011   Leukopenia 11/11/2011   HTN (hypertension) 11/11/2011   HLD (hyperlipidemia) 11/11/2011   Transaminitis 11/11/2011   Diabetes mellitus ([place]) 10/27/2011   Morbid obesity ([place]) 10/27/2011   Lymphedema of lower extremity 10/04/2011   PCP:  Noreene Larsson, NP Pharmacy:   [place], [place] - [address] [place] [address] Phone: [phone] Fax: [phone]     Social Determinants of Health (SDOH) Interventions    Readmission Risk Interventions Readmission Risk Prevention Plan 09/18/2021 04/30/2019  Transportation Screening Complete Complete  Medication Review (RN Care Manager) Complete Complete  PCP or  Specialist appointment within 3-5 days of discharge - Complete  HRI or Home Care Consult Complete Complete  SW Recovery Care/Counseling Consult Complete Complete  Palliative Care Screening Not Applicable Not Applicable  Skilled Nursing Facility Complete Not Applicable  Some recent data might be hidden

## 2021-09-18 NOTE — Care Management Important Message (Signed)
Important Message  Patient Details  Name: [name] MRN: [id number] Date of Birth: 1966/10/07   Medicare Important Message Given:  Yes (copy left on bedside table)     [name] 09/18/2021, 12:16 PM

## 2021-09-19 DIAGNOSIS — N186 End stage renal disease: Secondary | ICD-10-CM | POA: Diagnosis not present

## 2021-09-19 DIAGNOSIS — I482 Chronic atrial fibrillation, unspecified: Secondary | ICD-10-CM | POA: Diagnosis not present

## 2021-09-19 DIAGNOSIS — E0800 Diabetes mellitus due to underlying condition with hyperosmolarity without nonketotic hyperglycemic-hyperosmolar coma (NKHHC): Secondary | ICD-10-CM | POA: Diagnosis not present

## 2021-09-19 DIAGNOSIS — S71102D Unspecified open wound, left thigh, subsequent encounter: Secondary | ICD-10-CM | POA: Diagnosis not present

## 2021-09-19 LAB — BASIC METABOLIC PANEL
Anion gap: >20 — ABNORMAL HIGH (ref 5–15)
BUN: 62 mg/dL — ABNORMAL HIGH (ref 6–20)
CO2: 14 mmol/L — ABNORMAL LOW (ref 22–32)
Calcium: 8.4 mg/dL — ABNORMAL LOW (ref 8.9–10.3)
Chloride: 91 mmol/L — ABNORMAL LOW (ref 98–111)
Creatinine, Ser: 6.04 mg/dL — ABNORMAL HIGH (ref 0.44–1.00)
GFR, Estimated: 8 mL/min — ABNORMAL LOW (ref >60)
Glucose, Bld: 93 mg/dL (ref 70–99)
Potassium: 4.9 mmol/L (ref 3.5–5.1)
Sodium: 132 mmol/L — ABNORMAL LOW (ref 135–145)

## 2021-09-19 LAB — RENAL FUNCTION PANEL
Albumin: 3.3 g/dL — ABNORMAL LOW (ref 3.5–5.0)
Anion gap: >20 — ABNORMAL HIGH (ref 5–15)
BUN: 61 mg/dL — ABNORMAL HIGH (ref 6–20)
CO2: 14 mmol/L — ABNORMAL LOW (ref 22–32)
Calcium: 8.5 mg/dL — ABNORMAL LOW (ref 8.9–10.3)
Chloride: 91 mmol/L — ABNORMAL LOW (ref 98–111)
Creatinine, Ser: 6.19 mg/dL — ABNORMAL HIGH (ref 0.44–1.00)
GFR, Estimated: 7 mL/min — ABNORMAL LOW (ref >60)
Glucose, Bld: <20 mg/dL — CL (ref 70–99)
Phosphorus: 8 mg/dL — ABNORMAL HIGH (ref 2.5–4.6)
Potassium: 6.6 mmol/L (ref 3.5–5.1)
Sodium: 132 mmol/L — ABNORMAL LOW (ref 135–145)

## 2021-09-19 LAB — GLUCOSE, CAPILLARY
Glucose-Capillary: <10 mg/dL — CL (ref 70–99)
Glucose-Capillary: 93 mg/dL (ref 70–99)
Glucose-Capillary: 108 mg/dL — ABNORMAL HIGH (ref 70–99)
Glucose-Capillary: 245 mg/dL — ABNORMAL HIGH (ref 70–99)

## 2021-09-19 LAB — HEPATITIS B SURFACE ANTIBODY, QUANTITATIVE: Hep B S AB Quant (Post): 3.6 m[IU]/mL — ABNORMAL LOW (ref >9.9)

## 2021-09-19 MED ORDER — PERFLUTREN LIPID MICROSPHERE
1.0000 mL | INTRAVENOUS | Status: AC | PRN
Start: 2021-09-19 — End: 2021-09-19
  Filled 2021-09-19: qty 10

## 2021-09-19 MED ORDER — SODIUM ZIRCONIUM CYCLOSILICATE 10 G PO PACK
10.0000 g | PACK | Freq: Two times a day (BID) | ORAL | Status: DC
  Administered 2021-09-19: 10 g via ORAL
  Filled 2021-09-19: qty 1

## 2021-09-19 MED ORDER — GLUCOSE-VITAMIN C 4-6 GM-MG PO CHEW
CHEWABLE_TABLET | ORAL | Status: AC
  Filled 2021-09-19: qty 1

## 2021-09-19 MED ORDER — SODIUM CHLORIDE 0.9 % IV BOLUS
500.0000 mL | Freq: Once | INTRAVENOUS | Status: AC
  Administered 2021-09-19: 500 mL via INTRAVENOUS

## 2021-09-19 MED ORDER — DEXTROSE 50 % IV SOLN
INTRAVENOUS | Status: AC
  Administered 2021-09-19: 50 mL
  Filled 2021-09-19: qty 50

## 2021-09-19 MED ORDER — DEXTROSE 50 % IV SOLN: 1.0000 | Freq: Once | INTRAVENOUS | Status: AC

## 2021-09-19 NOTE — Progress Notes (Addendum)
Date and time results received: 09/19/21 0857 (use smartphrase ".now" to insert current time)  Test: potassium, glucose  Critical Value: k-6.6, glucose < 20  Name of Provider Notified: Eric [place] DO   Orders Received? Or Actions Taken?:  no new orders at this time.

## 2021-09-19 NOTE — Progress Notes (Signed)
NEPHROLOGY NURSING NOTE:  After d/w Dr. [place] stat BMP was drawn to re-check K which resulted 6.6 earlier this morning.  Suspect hemolysis in earlier specimen as repeat K is 4.9.  Next HD Monday, on schedule.  Rockwell Alexandria, RN

## 2021-09-19 NOTE — Progress Notes (Signed)
PROGRESS NOTE    [name]  FYB:[id number] DOB: Oct 01, 1966 DOA: 09/17/2021 PCP: Noreene Larsson, NP    Brief Narrative:  [name] is a 55 year old female with past medical history significant for essential hypertension, ESRD on HD (MWF), chronic diastolic congestive heart failure, COPD, type 2 diabetes mellitus, chronic lymphedema, morbid obesity who presents to [facility], ED on 9/29 with worsening left thigh infection, generalized weakness/fatigue in the setting of missed dialysis sessions.  Patient been complaining of worsening pain and purulent foul-smelling discharge from her wound site.  Was seen by her wound care nurse recommended that she be evaluated in the ED.  Denies fever/chills, no chest pain, no shortness of breath.  Patient reports missing 2 dialysis sessions within the last week, last session on Friday, 09/11/2021 due to being bedbound and no assistance with going to the dialysis center.  Patient recently admitted on 7/3 and discharged on 9/5 due to left medial thigh wound drain in which she went I&D on 8/31 by plastic surgery and wound VAC placement.  During the hospitalization she developed septic shock and CHF exacerbation.  She was advised to discharge to SNF but patient refused and subsequently went home.  In the ED, temperature 97.8 F, HR 98, RR 30, BP 142/112, SPO2 100% on room air.  Sodium 129, potassium 5.9, chloride 88, CO2 21, glucose 106, BUN 105, creatinine 8.88.  Magnesium 2.2, AST 15, ALT 16, total bilirubin 1.2.  BNP 262.0.  Lactic acid 1.3.  WBC 10.4, hemoglobin 10.0, platelets 295.  COVID-19 PCR negative.  Influenza A/B PCR negative.  Chest x-ray with marked cardiomegaly with mild pulmonary interstitial edema.  Left femur x-ray with no acute osseous abnormality or soft tissue gas identified.  Wound VAC was removed by EDP with some mild bleeding due to rupture of a superficial vein that was controlled with direct pressure, sutures and wrapped with a  compression wrap.  Vascular surgery was consulted and recommended packing the wound and applying compression wrap and only need for transfer to [facility] if bleeding uncontrolled.  Plastic surgery was also consulted and recommend discontinue the wound VAC and placing Xeroform gauze over the wound and changing dressing daily; with outpatient follow-up.  Nephrologist consulted regarding need for dialysis given hyperkalemia and uremia.  TRH consulted for further evaluation and management.   Assessment & Plan:   Principal Problem:   Open wound of left thigh Active Problems:   Lymphedema of lower extremity   Diabetes mellitus (HCC)   Hyperkalemia   HTN (hypertension)   HLD (hyperlipidemia)   ESRD on dialysis (HCC)   Lymphedema   Obesity, Class III, BMI 40-49.9 (morbid obesity) (HCC)   Hyponatremia   Hypoalbuminemia due to protein-calorie malnutrition (HCC)   Elevated brain natriuretic peptide (BNP) level   Atrial fibrillation, chronic (HCC)   Pulmonary edema   Prolonged QT interval   Elevated alkaline phosphatase in newborn   Hyperkalemia Hyponatremia Uremia Patient presenting to the ED after several missed HD sessions.  Patient dialyzes at [facility] on a Monday/Wednesday/Friday schedule.  Last HD session on Friday 9/23.  On arrival to the ED, patient was noted to have an elevated creatinine of 5.9 with a BUN of 105.  In the ED, patient was given sodium bicarbonate, Lokelma.  Nephrology was consulted and patient underwent hemodialysis on 09/18/2021. -- Potassium this morning 6.6, suspect hemolysis with repeat 4.9. --Follow BMP daily --Continue to monitor on telemetry --Next HD planned for Monday  Acute on chronic  diastolic congestive heart failure Patient presenting with mild shortness of breath.  TTE 06/22/2021 with LVEF 60 to 16%, grade 1 diastolic dysfunction, RVSP 68.9, RA severely dilated, IVC dilated.  Elevated BNP and chest x-ray with pulmonary edema on admission.  Etiology  likely secondary to noncompliance with outpatient hemodialysis. --Continue volume management with HD; next Monday  Left thigh wound Patient follows with plastic surgery outpatient, Dr. Marla Roe.  Underwent recent incision and drainage on 08/19/2021 with wound VAC placement.  Was recently seen by home health wound nurse for concern of worsening wound.  Patient is afebrile, no leukocytosis, lactic acid within normal limits.  X-ray with no concerning findings of left femur.  Plastic surgery was consulted on admission and reviewed images and recommended discontinuation of wound VAC with Xeroform dressings changed once daily. --Continue dressing changes once daily with Xeroform gauze --Outpatient follow-up with plastic surgery  ESRD on HD --Nephrology following --HD today, next session planned for Monday  Paroxysmal atrial fibrillation --Continue apixaban --Currently holding amiodarone --Metoprolol tartrate 12.5 mg p.o. BID  Prolonged QTC EKG with QTC 509 MS. --Holding amiodarone --Continue monitor on telemetry  Essential hypertension --Metoprolol tartrate 12.5 mg BID --Continue monitor blood pressure closely  Type 2 diabetes mellitus Diet controlled at home.  Recent hemoglobin A1c 6.9, well controlled.  Morbid obesity Body mass index is 47.09 kg/m.  Discussed with patient needs for aggressive lifestyle changes/weight loss as this complicates all facets of care.  Outpatient follow-up with PCP.  May benefit from bariatric evaluation outpatient.  Weakness/Fatigue/deconditioning: Recently discharged from [facility] on 08/24/2021, recommended SNF but patient declined.  Discussed with patient's if therapy recommends SNF placement will she consider, she declines once again. --PT/OT recommends SNF; but refuses --Anticipate discharge home when medically ready with home health services as refuses SNF placement.    DVT prophylaxis: SCDs Start: 09/17/21 1939 apixaban (ELIQUIS) tablet 5 mg     Code Status: Full Code Family Communication: No family present at bedside this morning  Disposition Plan:  Level of care: Telemetry Status is: Inpatient  Remains inpatient appropriate because:Persistent severe electrolyte disturbances, Ongoing diagnostic testing needed not appropriate for outpatient work up, Unsafe d/c plan, IV treatments appropriate due to intensity of illness or inability to take PO, and Inpatient level of care appropriate due to severity of illness  Dispo: The patient is from: Home              Anticipated d/c is to: Home              Patient currently is not medically stable to d/c.   Difficult to place patient No   Consultants:  Nephrology, Dr. Moshe Cipro Vascular surgery and plastic surgery -discussed with the EDP  Procedures:  Sutures to left thigh wound placed by EDP  Antimicrobials:  None   Subjective: Patient seen examined bedside, resting comfortably.  Slightly drowsy this morning.  Potassium elevated 6.6, repeated 4.9, likely hemolysis.  Discussed with dialysis nurse with plan for next HD on Monday.  No other specific complaints or concerns at this time.  Currently denies headache, no dizziness, no chest pain, no shortness of breath, no abdominal pain, no fever/chills/night sweats, no nausea/vomiting/diarrhea.  No acute events overnight per nursing staff.  Objective: Vitals:   09/19/21 0000 09/19/21 0500 09/19/21 0831 09/19/21 0944  BP: 108/64 90/60 (!) 78/59 97/62  Pulse:      Resp:  20 18   Temp:  98 F (36.7 C) (!) 97.5 F (36.4 C)   TempSrc:  Oral Oral   SpO2:  96% 99% 96%  Weight:  128.4 kg    Height:        Intake/Output Summary (Last 24 hours) at 09/19/2021 1255 Last data filed at 09/19/2021 0300 Gross per 24 hour  Intake 350 ml  Output --  Net 350 ml   Filed Weights   09/18/21 0457 09/18/21 0900 09/19/21 0500  Weight: 125.2 kg 125.2 kg 128.4 kg    Examination:  General exam: Appears calm and comfortable, obese Respiratory  system: Decreased breath sounds bilateral bases, otherwise clear to auscultation. Respiratory effort normal.  On room air Cardiovascular system: S1 & S2 heard, RRR. No JVD, murmurs, rubs, gallops or clicks. No pedal edema. Gastrointestinal system: Abdomen is nondistended, soft and nontender. No organomegaly or masses felt. Normal bowel sounds heard. Central nervous system: Alert and oriented. No focal neurological deficits. Extremities: Symmetric 5 x 5 power. Skin: Left thigh wound noted, dressing in place with Ace wrap, clean/dry/intact; otherwise no rashes, lesions or ulcers Psychiatry: Judgement and insight appear normal. Mood & affect appropriate.     Data Reviewed: I have personally reviewed following labs and imaging studies  CBC: Recent Labs  Lab 09/17/21 1325 09/18/21 0620  WBC 10.4 9.9  NEUTROABS 8.7*  --   HGB 10.0* 9.0*  HCT 30.9* 26.8*  MCV 92.5 91.2  PLT 295 641   Basic Metabolic Panel: Recent Labs  Lab 09/17/21 1325 09/18/21 0620 09/19/21 0813 09/19/21 1006  NA 127* 130* 132* 132*  K 5.9* 6.3* 6.6* 4.9  CL 88* 90* 91* 91*  CO2 21* 20* 14* 14*  GLUCOSE 106* 79 <20* 93  BUN 105* 114* 61* 62*  CREATININE 8.88* 9.30* 6.19* 6.04*  CALCIUM 8.2* 8.1* 8.5* 8.4*  MG 2.2 2.2  --   --   PHOS  --  7.4* 8.0*  --    GFR: Estimated Creatinine Clearance: 14.2 mL/min (A) (by C-G formula based on SCr of 6.04 mg/dL (H)). Liver Function Tests: Recent Labs  Lab 09/17/21 1325 09/18/21 0620 09/19/21 0813  AST 15 15  --   ALT 16 14  --   ALKPHOS 254* 223*  --   BILITOT 1.2 1.5*  --   PROT 7.8 7.3  --   ALBUMIN 3.3* 3.2* 3.3*   No results for input(s): LIPASE, AMYLASE in the last 168 hours. No results for input(s): AMMONIA in the last 168 hours. Coagulation Profile: Recent Labs  Lab 09/18/21 0620  INR 1.6*   Cardiac Enzymes: No results for input(s): CKTOTAL, CKMB, CKMBINDEX, TROPONINI in the last 168 hours. BNP (last 3 results) No results for input(s): PROBNP  in the last 8760 hours. HbA1C: Recent Labs    09/18/21 0620  HGBA1C 6.8*   CBG: Recent Labs  Lab 09/19/21 0757 09/19/21 0835 09/19/21 1207  GLUCAP <10* 93 108*   Lipid Profile: No results for input(s): CHOL, HDL, LDLCALC, TRIG, CHOLHDL, LDLDIRECT in the last 72 hours. Thyroid Function Tests: No results for input(s): TSH, T4TOTAL, FREET4, T3FREE, THYROIDAB in the last 72 hours. Anemia Panel: No results for input(s): VITAMINB12, FOLATE, FERRITIN, TIBC, IRON, RETICCTPCT in the last 72 hours. Sepsis Labs: Recent Labs  Lab 09/17/21 1325 09/17/21 1433  LATICACIDVEN 1.3 1.3    Recent Results (from the past 240 hour(s))  Blood culture (routine x 2)     Status: None (Preliminary result)   Collection Time: 09/17/21  1:25 PM   Specimen: Right Antecubital; Blood  Result Value Ref Range Status   Specimen  Description RIGHT ANTECUBITAL  Final   Special Requests   Final    Blood Culture adequate volume BOTTLES DRAWN AEROBIC AND ANAEROBIC   Culture   Final    NO GROWTH 2 DAYS Performed at [facility], [address], [place], [place] [address]    Report Status PENDING  Incomplete  Resp Panel by RT-PCR (Flu A&B, Covid) Nasopharyngeal Swab     Status: None   Collection Time: 09/17/21  1:27 PM   Specimen: Nasopharyngeal Swab; Nasopharyngeal(NP) swabs in vial transport medium  Result Value Ref Range Status   SARS Coronavirus 2 by RT PCR NEGATIVE NEGATIVE Final    Comment: (NOTE) SARS-CoV-2 target nucleic acids are NOT DETECTED.  The SARS-CoV-2 RNA is generally detectable in upper respiratory specimens during the acute phase of infection. The lowest concentration of SARS-CoV-2 viral copies this assay can detect is 138 copies/mL. A negative result does not preclude SARS-Cov-2 infection and should not be used as the sole basis for treatment or other patient management decisions. A negative result may occur with  improper specimen collection/handling, submission of specimen other than  nasopharyngeal swab, presence of viral mutation(s) within the areas targeted by this assay, and inadequate number of viral copies(<138 copies/mL). A negative result must be combined with clinical observations, patient history, and epidemiological information. The expected result is Negative.  Fact Sheet for Patients:  EntrepreneurPulse.com.au  Fact Sheet for Healthcare Providers:  IncredibleEmployment.be  This test is no t yet approved or cleared by the [place] FDA and  has been authorized for detection and/or diagnosis of SARS-CoV-2 by FDA under an Emergency Use Authorization (EUA). This EUA will remain  in effect (meaning this test can be used) for the duration of the COVID-19 declaration under Section 564(b)(1) of the Act, 21 U.S.C.section 360bbb-3(b)(1), unless the authorization is terminated  or revoked sooner.       Influenza A by PCR NEGATIVE NEGATIVE Final   Influenza B by PCR NEGATIVE NEGATIVE Final    Comment: (NOTE) The Xpert Xpress SARS-CoV-2/FLU/RSV plus assay is intended as an aid in the diagnosis of influenza from Nasopharyngeal swab specimens and should not be used as a sole basis for treatment. Nasal washings and aspirates are unacceptable for Xpert Xpress SARS-CoV-2/FLU/RSV testing.  Fact Sheet for Patients: EntrepreneurPulse.com.au  Fact Sheet for Healthcare Providers: IncredibleEmployment.be  This test is not yet approved or cleared by the [place] FDA and has been authorized for detection and/or diagnosis of SARS-CoV-2 by FDA under an Emergency Use Authorization (EUA). This EUA will remain in effect (meaning this test can be used) for the duration of the COVID-19 declaration under Section 564(b)(1) of the Act, 21 U.S.C. section 360bbb-3(b)(1), unless the authorization is terminated or revoked.  Performed at [facility], [address], [place], [place] [address]   Blood  culture (routine x 2)     Status: None (Preliminary result)   Collection Time: 09/17/21  1:30 PM   Specimen: BLOOD  Result Value Ref Range Status   Specimen Description BLOOD  Final   Special Requests NONE  Final   Culture   Final    NO GROWTH 2 DAYS Performed at [facility], [address], [place], [place] [address]    Report Status PENDING  Incomplete         Radiology Studies: DG Chest 1 View  Result Date: 09/17/2021 CLINICAL DATA:  Edema, large wound to left upper medial thigh EXAM: CHEST  1 VIEW COMPARISON:  Chest radiograph 07/01/2021 FINDINGS: The  heart is markedly enlarged, unchanged. The mediastinal contours are stable. There is vascular congestion with suspected mild pulmonary interstitial edema. There is no focal airspace disease. There is no significant pleural effusion. There is no pneumothorax. There is no acute osseous abnormality. IMPRESSION: Marked cardiomegaly with mild pulmonary interstitial edema. Electronically Signed   By: Valetta Mole M.D.   On: 09/17/2021 14:56   DG FEMUR MIN 2 VIEWS LEFT  Result Date: 09/17/2021 CLINICAL DATA:  Wound infection of left thigh EXAM: LEFT FEMUR 2 VIEWS COMPARISON:  Femur radiographs 09/11/2015, CT femur 07/15/2021 FINDINGS: There is no evidence of acute osseous abnormality. There is no erosive or destructive change. There is degenerative change about the knee. No soft tissue gas is identified. IMPRESSION: No acute osseous abnormality or soft tissue gas identified. Electronically Signed   By: Valetta Mole M.D.   On: 09/17/2021 14:58   ECHOCARDIOGRAM LIMITED  Result Date: 09/18/2021    ECHOCARDIOGRAM LIMITED REPORT   Patient Name:   Lindsay CREMEANS Date of Exam: 09/18/2021 Medical Rec #:  [id number]        Height:       65.0 in Accession #:    [id number]       Weight:       276.0 lb Date of Birth:  1966-10-29        BSA:          2.268 m Patient Age:    55 years         BP:           133/48 mmHg Patient Gender: F                HR:            103 bpm. Exam Location:  [facility] Procedure: Limited Echo Indications:    CHF-Acute Diastolic  History:        Patient has prior history of Echocardiogram examinations, most                 recent 06/22/2021. CHF, COPD, Arrythmias:Atrial Fibrillation,                 Signs/Symptoms:Shortness of Breath; Risk Factors:Hypertension,                 Diabetes and Dyslipidemia. Morbid Obesity, ESRD on dialysis.  Sonographer:    Wenda Low Referring Phys: [id number] OLADAPO ADEFESO IMPRESSIONS  1. Incomplete exam no color flow or doppler done Study noted as Limited ?.  2. Left ventricular ejection fraction, by estimation, is 60 to 65%. The left ventricle has normal function. The left ventricle has no regional wall motion abnormalities. There is moderate left ventricular hypertrophy.  3. D shaped septum findings consistent with severe pulmonary hypertension and cor pulmonale . Right ventricular systolic function is severely reduced. The right ventricular size is normal.  4. Left atrial size was mildly dilated.  5. Right atrial size was severely dilated.  6. No color flow or doppler done . The mitral valve is degenerative. No evidence of mitral valve regurgitation. No evidence of mitral stenosis. Moderate mitral annular calcification.  7. No color flow / doppler done . Tricuspid valve regurgitation is not demonstrated.  8. No color flow or doppler done . The aortic valve is tricuspid. Aortic valve regurgitation is not visualized. Mild to moderate aortic valve sclerosis/calcification is present, without any evidence of aortic stenosis.  9. The inferior vena cava is dilated in size with >50% respiratory variability, suggesting right  atrial pressure of 8 mmHg. FINDINGS  Left Ventricle: Left ventricular ejection fraction, by estimation, is 60 to 65%. The left ventricle has normal function. The left ventricle has no regional wall motion abnormalities. Definity contrast agent was given IV to delineate the left ventricular   endocardial borders. The left ventricular internal cavity size was normal in size. There is moderate left ventricular hypertrophy. Right Ventricle: D shaped septum findings consistent with severe pulmonary hypertension and cor pulmonale. The right ventricular size is normal. No increase in right ventricular wall thickness. Right ventricular systolic function is severely reduced. Left Atrium: Left atrial size was mildly dilated. Right Atrium: Right atrial size was severely dilated. Pericardium: There is no evidence of pericardial effusion. Mitral Valve: No color flow or doppler done. The mitral valve is degenerative in appearance. There is moderate thickening of the mitral valve leaflet(s). There is moderate calcification of the mitral valve leaflet(s). Moderate mitral annular calcification. No evidence of mitral valve stenosis. Tricuspid Valve: No color flow / doppler done. The tricuspid valve is normal in structure. Tricuspid valve regurgitation is not demonstrated. No evidence of tricuspid stenosis. Aortic Valve: No color flow or doppler done. The aortic valve is tricuspid. Aortic valve regurgitation is not visualized. Mild to moderate aortic valve sclerosis/calcification is present, without any evidence of aortic stenosis. Pulmonic Valve: The pulmonic valve was normal in structure. Pulmonic valve regurgitation is not visualized. No evidence of pulmonic stenosis. Aorta: The aortic root is normal in size and structure. Venous: The inferior vena cava is dilated in size with greater than 50% respiratory variability, suggesting right atrial pressure of 8 mmHg. IAS/Shunts: No atrial level shunt detected by color flow Doppler. Additional Comments: Incomplete exam no color flow or doppler done Study noted as Limited ?Marland Kitchen LEFT VENTRICLE PLAX 2D LVIDd:         4.40 cm LVIDs:         3.30 cm LV PW:         1.30 cm LV IVS:        1.60 cm LVOT diam:     2.00 cm LVOT Area:     3.14 cm  LEFT ATRIUM         Index LA diam:    3.90  cm 1.72 cm/m   AORTA Ao Root diam: 2.60 cm  SHUNTS Systemic Diam: 2.00 cm Jenkins Rouge MD Electronically signed by Jenkins Rouge MD Signature Date/Time: 09/18/2021/4:55:57 PM    Final         Scheduled Meds:  apixaban  5 mg Oral BID   calcium acetate  1,334 mg Oral TID with meals   Chlorhexidine Gluconate Cloth  6 each Topical Q0600   darbepoetin (ARANESP) injection - DIALYSIS  100 mcg Intravenous Q Fri-HD   feeding supplement  237 mL Oral BID BM   glucose-Vitamin C       influenza vac split quadrivalent PF  0.5 mL Intramuscular Tomorrow-1000   lanthanum  500 mg Oral TID WC   metoprolol tartrate  12.5 mg Oral BID   midodrine  10 mg Oral TID WC   multivitamin  1 tablet Oral QHS   pantoprazole  40 mg Oral BID   Continuous Infusions:  sodium chloride     sodium chloride     albumin human Stopped (09/18/21 1912)     LOS: 2 days    Time spent: 41 minutes spent on chart review, discussion with nursing staff, consultants, updating family and interview/physical exam; more than 50% of that time  was spent in counseling and/or coordination of care.    Jordan Caraveo J [place] DO [facility] Available via Epic secure chat 7am-7pm After these hours, please refer to coverage provider listed on [email or web address] 09/19/2021, 12:55 PM

## 2021-09-19 NOTE — Progress Notes (Signed)
Patient's B/P 71/54,recheck 88/61 ,Dr [place] notified. Orders received and given. NS Bolus 500 cc given per MD orders. Will continue to monitor.

## 2021-09-19 NOTE — Progress Notes (Signed)
Patient had a critical CBG of 20 this am, alert to person and place,patient states she was so sleepy.Vital signs also take B/P 78/59, Dr [place] notified. 1 Ampule of Dextrose given. Oxygen applied  2.5 Liters [place] as well patient's Oxygen sat was 72. Will continue to monitor patient.

## 2021-09-20 DIAGNOSIS — S71102D Unspecified open wound, left thigh, subsequent encounter: Secondary | ICD-10-CM | POA: Diagnosis not present

## 2021-09-20 DIAGNOSIS — I482 Chronic atrial fibrillation, unspecified: Secondary | ICD-10-CM | POA: Diagnosis not present

## 2021-09-20 DIAGNOSIS — E0800 Diabetes mellitus due to underlying condition with hyperosmolarity without nonketotic hyperglycemic-hyperosmolar coma (NKHHC): Secondary | ICD-10-CM | POA: Diagnosis not present

## 2021-09-20 LAB — RENAL FUNCTION PANEL
Albumin: 3.2 g/dL — ABNORMAL LOW (ref 3.5–5.0)
Anion gap: 16 — ABNORMAL HIGH (ref 5–15)
BUN: 71 mg/dL — ABNORMAL HIGH (ref 6–20)
CO2: 22 mmol/L (ref 22–32)
Calcium: 8.2 mg/dL — ABNORMAL LOW (ref 8.9–10.3)
Chloride: 92 mmol/L — ABNORMAL LOW (ref 98–111)
Creatinine, Ser: 6.43 mg/dL — ABNORMAL HIGH (ref 0.44–1.00)
GFR, Estimated: 7 mL/min — ABNORMAL LOW (ref >60)
Glucose, Bld: 180 mg/dL — ABNORMAL HIGH (ref 70–99)
Phosphorus: 5.8 mg/dL — ABNORMAL HIGH (ref 2.5–4.6)
Potassium: 4.3 mmol/L (ref 3.5–5.1)
Sodium: 130 mmol/L — ABNORMAL LOW (ref 135–145)

## 2021-09-20 LAB — GLUCOSE, CAPILLARY: Glucose-Capillary: 197 mg/dL — ABNORMAL HIGH (ref 70–99)

## 2021-09-20 MED ORDER — SODIUM CHLORIDE 0.9 % IV BOLUS
500.0000 mL | Freq: Once | INTRAVENOUS | Status: AC
  Administered 2021-09-20: 500 mL via INTRAVENOUS

## 2021-09-20 MED ORDER — SODIUM CHLORIDE 0.9 % IV BOLUS
250.0000 mL | Freq: Once | INTRAVENOUS | Status: AC
  Administered 2021-09-20: 250 mL via INTRAVENOUS

## 2021-09-20 MED ORDER — AMIODARONE HCL 200 MG PO TABS
200.0000 mg | ORAL_TABLET | Freq: Every day | ORAL | Status: DC
  Administered 2021-09-20 – 2021-10-03 (×14): 200 mg via ORAL
  Filled 2021-09-20 (×14): qty 1

## 2021-09-20 MED ORDER — MIDODRINE HCL 5 MG PO TABS
15.0000 mg | ORAL_TABLET | Freq: Three times a day (TID) | ORAL | Status: DC
  Administered 2021-09-20 – 2021-09-24 (×10): 15 mg via ORAL
  Filled 2021-09-20 (×10): qty 3

## 2021-09-20 NOTE — Progress Notes (Signed)
PROGRESS NOTE    [name]  UUV:[id number] DOB: 1966-02-20 DOA: 09/17/2021 PCP: Noreene Larsson, NP    Brief Narrative:  [name] is a 55 year old female with past medical history significant for essential hypertension, ESRD on HD (MWF), chronic diastolic congestive heart failure, COPD, type 2 diabetes mellitus, chronic lymphedema, morbid obesity who presents to [facility], ED on 9/29 with worsening left thigh infection, generalized weakness/fatigue in the setting of missed dialysis sessions.  Patient been complaining of worsening pain and purulent foul-smelling discharge from her wound site.  Was seen by her wound care nurse recommended that she be evaluated in the ED.  Denies fever/chills, no chest pain, no shortness of breath.  Patient reports missing 2 dialysis sessions within the last week, last session on Friday, 09/11/2021 due to being bedbound and no assistance with going to the dialysis center.  Patient recently admitted on 7/3 and discharged on 9/5 due to left medial thigh wound drain in which she went I&D on 8/31 by plastic surgery and wound VAC placement.  During the hospitalization she developed septic shock and CHF exacerbation.  She was advised to discharge to SNF but patient refused and subsequently went home.  In the ED, temperature 97.8 F, HR 98, RR 30, BP 142/112, SPO2 100% on room air.  Sodium 129, potassium 5.9, chloride 88, CO2 21, glucose 106, BUN 105, creatinine 8.88.  Magnesium 2.2, AST 15, ALT 16, total bilirubin 1.2.  BNP 262.0.  Lactic acid 1.3.  WBC 10.4, hemoglobin 10.0, platelets 295.  COVID-19 PCR negative.  Influenza A/B PCR negative.  Chest x-ray with marked cardiomegaly with mild pulmonary interstitial edema.  Left femur x-ray with no acute osseous abnormality or soft tissue gas identified.  Wound VAC was removed by EDP with some mild bleeding due to rupture of a superficial vein that was controlled with direct pressure, sutures and wrapped with a  compression wrap.  Vascular surgery was consulted and recommended packing the wound and applying compression wrap and only need for transfer to [facility] if bleeding uncontrolled.  Plastic surgery was also consulted and recommend discontinue the wound VAC and placing Xeroform gauze over the wound and changing dressing daily; with outpatient follow-up.  Nephrologist consulted regarding need for dialysis given hyperkalemia and uremia.  TRH consulted for further evaluation and management.   Assessment & Plan:   Principal Problem:   Open wound of left thigh Active Problems:   Lymphedema of lower extremity   Diabetes mellitus (HCC)   Hyperkalemia   HTN (hypertension)   HLD (hyperlipidemia)   ESRD on dialysis (HCC)   Lymphedema   Obesity, Class III, BMI 40-49.9 (morbid obesity) (HCC)   Hyponatremia   Hypoalbuminemia due to protein-calorie malnutrition (HCC)   Elevated brain natriuretic peptide (BNP) level   Atrial fibrillation, chronic (HCC)   Pulmonary edema   Prolonged QT interval   Elevated alkaline phosphatase in newborn   Hyperkalemia Hyponatremia Uremia Patient presenting to the ED after several missed HD sessions.  Patient dialyzes at [facility] on a Monday/Wednesday/Friday schedule.  Last HD session on Friday 9/23.  On arrival to the ED, patient was noted to have an elevated creatinine of 5.9 with a BUN of 105.  In the ED, patient was given sodium bicarbonate, Lokelma.  Nephrology was consulted and patient underwent hemodialysis on 09/18/2021. --Potassium this morning 4.3 --Follow BMP daily --Continue to monitor on telemetry --Next HD planned 10/3  Acute on chronic diastolic congestive heart failure Patient presenting with  mild shortness of breath.  TTE 06/22/2021 with LVEF 60 to 14%, grade 1 diastolic dysfunction, RVSP 68.9, RA severely dilated, IVC dilated.  Elevated BNP and chest x-ray with pulmonary edema on admission.  Etiology likely secondary to noncompliance with  outpatient hemodialysis. --Continue volume management with HD; next 10/3  Left thigh wound Patient follows with plastic surgery outpatient, Dr. Marla Roe.  Underwent recent incision and drainage on 08/19/2021 with wound VAC placement.  Was recently seen by home health wound nurse for concern of worsening wound.  Patient is afebrile, no leukocytosis, lactic acid within normal limits.  X-ray with no concerning findings of left femur.  Plastic surgery was consulted on admission and reviewed images and recommended discontinuation of wound VAC with Xeroform dressings changed once daily. --Continue dressing changes once daily  --Outpatient follow-up with plastic surgery  ESRD on HD --Nephrology following --Next HD session planned for Monday  Paroxysmal atrial fibrillation --Continue apixaban --Amiodarone 247m PO daily  Prolonged QTC EKG with QTC 509 MS. --restarted amiodarone --Continue monitor on telemetry  Hx Chronic hypotension --increased midodrine back to home dose 177mPO TID --Continue monitor blood pressure closely  Type 2 diabetes mellitus Diet controlled at home.  Recent hemoglobin A1c 6.9, well controlled.  Morbid obesity Body mass index is 47.73 kg/m.  Discussed with patient needs for aggressive lifestyle changes/weight loss as this complicates all facets of care.  Outpatient follow-up with PCP.  May benefit from bariatric evaluation outpatient.  Weakness/Fatigue/deconditioning: [facility], recommended SNF but patient declined.  Discussed with patient's if therapy recommends SNF placement will she consider, she declines once again. --PT/OT recommends SNF; but refuses --Anticipate discharge home when medically ready with home health services as refuses SNF placement.    DVT prophylaxis: SCDs Start: 09/17/21 1939 apixaban (ELIQUIS) tablet 5 mg    Code Status: Full Code Family Communication: No family present at bedside this  morning  Disposition Plan:  Level of care: Telemetry Status is: Inpatient  Remains inpatient appropriate because:Persistent severe electrolyte disturbances, Ongoing diagnostic testing needed not appropriate for outpatient work up, Unsafe d/c plan, IV treatments appropriate due to intensity of illness or inability to take PO, and Inpatient level of care appropriate due to severity of illness  Dispo: The patient is from: Home              Anticipated d/c is to: Home              Patient currently is not medically stable to d/c.   Difficult to place patient No   Consultants:  Nephrology, Dr. GoMoshe Ciproascular surgery and plastic surgery -discussed with the EDP  Procedures:  Sutures to left thigh wound placed by EDP  Antimicrobials:  None   Subjective: Patient seen examined bedside, resting comfortably.  RN present in room.  About to eat breakfast.  Feels better this morning.  Discussed Next HD tomorrow and if tolerates okay may discharge home as she refuses SNF.  Potassium stable, 4.3 today.  Increased her midodrine back to her home dose of 50 mg p.o. 3 times daily today.  No other specific complaints or concerns at this time.  Currently denies headache, no dizziness, no chest pain, no shortness of breath, no abdominal pain, no fever/chills/night sweats, no nausea/vomiting/diarrhea.  No acute events overnight per nursing staff.  Objective: Vitals:   09/20/21 0612 09/20/21 0614 09/20/21 0800 09/20/21 1100  BP: 90/63  (!) 96/54 (!) 90/57  Pulse: (!) 108 98 (!) 109 (!Marland Kitchen  105  Resp: 18     Temp:      TempSrc:      SpO2:      Weight:  130.1 kg    Height:        Intake/Output Summary (Last 24 hours) at 09/20/2021 1213 Last data filed at 09/20/2021 0900 Gross per 24 hour  Intake 1080 ml  Output --  Net 1080 ml   Filed Weights   09/18/21 0900 09/19/21 0500 09/20/21 0614  Weight: 125.2 kg 128.4 kg 130.1 kg    Examination:  General exam: Appears calm and comfortable, obese,  chronically ill in appearance Respiratory system: Decreased breath sounds bilateral bases, otherwise clear to auscultation. Respiratory effort normal.  On room air Cardiovascular system: S1 & S2 heard, RRR. No JVD, murmurs, rubs, gallops or clicks.  Noted significant lymphedema, stable Gastrointestinal system: Abdomen is nondistended, soft and nontender. No organomegaly or masses felt. Normal bowel sounds heard. Central nervous system: Alert and oriented. No focal neurological deficits. Extremities: Symmetric 5 x 5 power. Skin: Left thigh wound noted, dressing in place with Ace wrap, clean/dry/intact; noted bilateral lower extremity lymphedema which is at baseline, otherwise no rashes, lesions or ulcers Psychiatry: Judgement and insight appear normal. Mood & affect appropriate.     Data Reviewed: I have personally reviewed following labs and imaging studies  CBC: Recent Labs  Lab 09/17/21 1325 09/18/21 0620  WBC 10.4 9.9  NEUTROABS 8.7*  --   HGB 10.0* 9.0*  HCT 30.9* 26.8*  MCV 92.5 91.2  PLT 295 109   Basic Metabolic Panel: Recent Labs  Lab 09/17/21 1325 09/18/21 0620 09/19/21 0813 09/19/21 1006 09/20/21 0511  NA 127* 130* 132* 132* 130*  K 5.9* 6.3* 6.6* 4.9 4.3  CL 88* 90* 91* 91* 92*  CO2 21* 20* 14* 14* 22  GLUCOSE 106* 79 <20* 93 180*  BUN 105* 114* 61* 62* 71*  CREATININE 8.88* 9.30* 6.19* 6.04* 6.43*  CALCIUM 8.2* 8.1* 8.5* 8.4* 8.2*  MG 2.2 2.2  --   --   --   PHOS  --  7.4* 8.0*  --  5.8*   GFR: Estimated Creatinine Clearance: 13.5 mL/min (A) (by C-G formula based on SCr of 6.43 mg/dL (H)). Liver Function Tests: Recent Labs  Lab 09/17/21 1325 09/18/21 0620 09/19/21 0813 09/20/21 0511  AST 15 15  --   --   ALT 16 14  --   --   ALKPHOS 254* 223*  --   --   BILITOT 1.2 1.5*  --   --   PROT 7.8 7.3  --   --   ALBUMIN 3.3* 3.2* 3.3* 3.2*   No results for input(s): LIPASE, AMYLASE in the last 168 hours. No results for input(s): AMMONIA in the last 168  hours. Coagulation Profile: Recent Labs  Lab 09/18/21 0620  INR 1.6*   Cardiac Enzymes: No results for input(s): CKTOTAL, CKMB, CKMBINDEX, TROPONINI in the last 168 hours. BNP (last 3 results) No results for input(s): PROBNP in the last 8760 hours. HbA1C: Recent Labs    09/18/21 0620  HGBA1C 6.8*   CBG: Recent Labs  Lab 09/19/21 0757 09/19/21 0835 09/19/21 1207 09/19/21 1735  GLUCAP <10* 93 108* 245*   Lipid Profile: No results for input(s): CHOL, HDL, LDLCALC, TRIG, CHOLHDL, LDLDIRECT in the last 72 hours. Thyroid Function Tests: No results for input(s): TSH, T4TOTAL, FREET4, T3FREE, THYROIDAB in the last 72 hours. Anemia Panel: No results for input(s): VITAMINB12, FOLATE, FERRITIN, TIBC, IRON, RETICCTPCT  in the last 72 hours. Sepsis Labs: Recent Labs  Lab 09/17/21 1325 09/17/21 1433  LATICACIDVEN 1.3 1.3    Recent Results (from the past 240 hour(s))  Blood culture (routine x 2)     Status: None (Preliminary result)   Collection Time: 09/17/21  1:25 PM   Specimen: Right Antecubital; Blood  Result Value Ref Range Status   Specimen Description RIGHT ANTECUBITAL  Final   Special Requests   Final    Blood Culture adequate volume BOTTLES DRAWN AEROBIC AND ANAEROBIC   Culture   Final    NO GROWTH 3 DAYS Performed at [facility], [address], [place], [place] [address]    Report Status PENDING  Incomplete  Resp Panel by RT-PCR (Flu A&B, Covid) Nasopharyngeal Swab     Status: None   Collection Time: 09/17/21  1:27 PM   Specimen: Nasopharyngeal Swab; Nasopharyngeal(NP) swabs in vial transport medium  Result Value Ref Range Status   SARS Coronavirus 2 by RT PCR NEGATIVE NEGATIVE Final    Comment: (NOTE) SARS-CoV-2 target nucleic acids are NOT DETECTED.  The SARS-CoV-2 RNA is generally detectable in upper respiratory specimens during the acute phase of infection. The lowest concentration of SARS-CoV-2 viral copies this assay can detect is 138 copies/mL. A  negative result does not preclude SARS-Cov-2 infection and should not be used as the sole basis for treatment or other patient management decisions. A negative result may occur with  improper specimen collection/handling, submission of specimen other than nasopharyngeal swab, presence of viral mutation(s) within the areas targeted by this assay, and inadequate number of viral copies(<138 copies/mL). A negative result must be combined with clinical observations, patient history, and epidemiological information. The expected result is Negative.  Fact Sheet for Patients:  EntrepreneurPulse.com.au  Fact Sheet for Healthcare Providers:  IncredibleEmployment.be  This test is no t yet approved or cleared by the [place] FDA and  has been authorized for detection and/or diagnosis of SARS-CoV-2 by FDA under an Emergency Use Authorization (EUA). This EUA will remain  in effect (meaning this test can be used) for the duration of the COVID-19 declaration under Section 564(b)(1) of the Act, 21 U.S.C.section 360bbb-3(b)(1), unless the authorization is terminated  or revoked sooner.       Influenza A by PCR NEGATIVE NEGATIVE Final   Influenza B by PCR NEGATIVE NEGATIVE Final    Comment: (NOTE) The Xpert Xpress SARS-CoV-2/FLU/RSV plus assay is intended as an aid in the diagnosis of influenza from Nasopharyngeal swab specimens and should not be used as a sole basis for treatment. Nasal washings and aspirates are unacceptable for Xpert Xpress SARS-CoV-2/FLU/RSV testing.  Fact Sheet for Patients: EntrepreneurPulse.com.au  Fact Sheet for Healthcare Providers: IncredibleEmployment.be  This test is not yet approved or cleared by the [place] FDA and has been authorized for detection and/or diagnosis of SARS-CoV-2 by FDA under an Emergency Use Authorization (EUA). This EUA will remain in effect (meaning this test can  be used) for the duration of the COVID-19 declaration under Section 564(b)(1) of the Act, 21 U.S.C. section 360bbb-3(b)(1), unless the authorization is terminated or revoked.  Performed at [facility], [address], [place], [place] [address]   Blood culture (routine x 2)     Status: None (Preliminary result)   Collection Time: 09/17/21  1:30 PM   Specimen: BLOOD  Result Value Ref Range Status   Specimen Description BLOOD  Final   Special Requests NONE  Final   Culture   Final  NO GROWTH 3 DAYS Performed at [facility], [address], [place],  [address]    Report Status PENDING  Incomplete         Radiology Studies: ECHOCARDIOGRAM LIMITED  Result Date: 09/18/2021    ECHOCARDIOGRAM LIMITED REPORT   Patient Name:   [name] Date of Exam: 09/18/2021 Medical Rec #:  [id number]        Height:       65.0 in Accession #:    [id number]       Weight:       276.0 lb Date of Birth:  1966/03/20        BSA:          2.268 m Patient Age:    59 years         BP:           133/48 mmHg Patient Gender: F                HR:           103 bpm. Exam Location:  [facility] Procedure: Limited Echo Indications:    CHF-Acute Diastolic  History:        Patient has prior history of Echocardiogram examinations, most                 recent 06/22/2021. CHF, COPD, Arrythmias:Atrial Fibrillation,                 Signs/Symptoms:Shortness of Breath; Risk Factors:Hypertension,                 Diabetes and Dyslipidemia. Morbid Obesity, ESRD on dialysis.  Sonographer:    Wenda Low Referring Phys: [id number] OLADAPO ADEFESO IMPRESSIONS  1. Incomplete exam no color flow or doppler done Study noted as Limited ?.  2. Left ventricular ejection fraction, by estimation, is 60 to 65%. The left ventricle has normal function. The left ventricle has no regional wall motion abnormalities. There is moderate left ventricular hypertrophy.  3. D shaped septum findings consistent with severe pulmonary hypertension and cor  pulmonale . Right ventricular systolic function is severely reduced. The right ventricular size is normal.  4. Left atrial size was mildly dilated.  5. Right atrial size was severely dilated.  6. No color flow or doppler done . The mitral valve is degenerative. No evidence of mitral valve regurgitation. No evidence of mitral stenosis. Moderate mitral annular calcification.  7. No color flow / doppler done . Tricuspid valve regurgitation is not demonstrated.  8. No color flow or doppler done . The aortic valve is tricuspid. Aortic valve regurgitation is not visualized. Mild to moderate aortic valve sclerosis/calcification is present, without any evidence of aortic stenosis.  9. The inferior vena cava is dilated in size with >50% respiratory variability, suggesting right atrial pressure of 8 mmHg. FINDINGS  Left Ventricle: Left ventricular ejection fraction, by estimation, is 60 to 65%. The left ventricle has normal function. The left ventricle has no regional wall motion abnormalities. Definity contrast agent was given IV to delineate the left ventricular  endocardial borders. The left ventricular internal cavity size was normal in size. There is moderate left ventricular hypertrophy. Right Ventricle: D shaped septum findings consistent with severe pulmonary hypertension and cor pulmonale. The right ventricular size is normal. No increase in right ventricular wall thickness. Right ventricular systolic function is severely reduced. Left Atrium: Left atrial size was mildly dilated. Right Atrium: Right atrial size was severely dilated. Pericardium: There is no evidence  of pericardial effusion. Mitral Valve: No color flow or doppler done. The mitral valve is degenerative in appearance. There is moderate thickening of the mitral valve leaflet(s). There is moderate calcification of the mitral valve leaflet(s). Moderate mitral annular calcification. No evidence of mitral valve stenosis. Tricuspid Valve: No color flow /  doppler done. The tricuspid valve is normal in structure. Tricuspid valve regurgitation is not demonstrated. No evidence of tricuspid stenosis. Aortic Valve: No color flow or doppler done. The aortic valve is tricuspid. Aortic valve regurgitation is not visualized. Mild to moderate aortic valve sclerosis/calcification is present, without any evidence of aortic stenosis. Pulmonic Valve: The pulmonic valve was normal in structure. Pulmonic valve regurgitation is not visualized. No evidence of pulmonic stenosis. Aorta: The aortic root is normal in size and structure. Venous: The inferior vena cava is dilated in size with greater than 50% respiratory variability, suggesting right atrial pressure of 8 mmHg. IAS/Shunts: No atrial level shunt detected by color flow Doppler. Additional Comments: Incomplete exam no color flow or doppler done Study noted as Limited ?Marland Kitchen LEFT VENTRICLE PLAX 2D LVIDd:         4.40 cm LVIDs:         3.30 cm LV PW:         1.30 cm LV IVS:        1.60 cm LVOT diam:     2.00 cm LVOT Area:     3.14 cm  LEFT ATRIUM         Index LA diam:    3.90 cm 1.72 cm/m   AORTA Ao Root diam: 2.60 cm  SHUNTS Systemic Diam: 2.00 cm Jenkins Rouge MD Electronically signed by Jenkins Rouge MD Signature Date/Time: 09/18/2021/4:55:57 PM    Final         Scheduled Meds:  amiodarone  200 mg Oral Daily   apixaban  5 mg Oral BID   calcium acetate  1,334 mg Oral TID with meals   Chlorhexidine Gluconate Cloth  6 each Topical Q0600   darbepoetin (ARANESP) injection - DIALYSIS  100 mcg Intravenous Q Fri-HD   feeding supplement  237 mL Oral BID BM   influenza vac split quadrivalent PF  0.5 mL Intramuscular Tomorrow-1000   lanthanum  500 mg Oral TID WC   midodrine  15 mg Oral TID WC   multivitamin  1 tablet Oral QHS   pantoprazole  40 mg Oral BID   Continuous Infusions:  sodium chloride     sodium chloride     albumin human Stopped (09/18/21 1912)     LOS: 3 days    Time spent: 36 minutes spent on chart  review, discussion with nursing staff, consultants, updating family and interview/physical exam; more than 50% of that time was spent in counseling and/or coordination of care.    Jakeria Caissie J [place] DO [facility] Available via Epic secure chat 7am-7pm After these hours, please refer to coverage provider listed on [email or web address] 09/20/2021, 12:13 PM

## 2021-09-21 ENCOUNTER — Inpatient Hospital Stay (HOSPITAL_COMMUNITY): Payer: 59

## 2021-09-21 DIAGNOSIS — R6521 Severe sepsis with septic shock: Secondary | ICD-10-CM

## 2021-09-21 DIAGNOSIS — E0800 Diabetes mellitus due to underlying condition with hyperosmolarity without nonketotic hyperglycemic-hyperosmolar coma (NKHHC): Secondary | ICD-10-CM | POA: Diagnosis not present

## 2021-09-21 DIAGNOSIS — A419 Sepsis, unspecified organism: Secondary | ICD-10-CM

## 2021-09-21 DIAGNOSIS — I482 Chronic atrial fibrillation, unspecified: Secondary | ICD-10-CM | POA: Diagnosis not present

## 2021-09-21 DIAGNOSIS — S71102D Unspecified open wound, left thigh, subsequent encounter: Secondary | ICD-10-CM | POA: Diagnosis not present

## 2021-09-21 LAB — RENAL FUNCTION PANEL
Albumin: 2.8 g/dL — ABNORMAL LOW (ref 3.5–5.0)
Anion gap: 17 — ABNORMAL HIGH (ref 5–15)
BUN: 84 mg/dL — ABNORMAL HIGH (ref 6–20)
CO2: 19 mmol/L — ABNORMAL LOW (ref 22–32)
Calcium: 8.2 mg/dL — ABNORMAL LOW (ref 8.9–10.3)
Chloride: 94 mmol/L — ABNORMAL LOW (ref 98–111)
Creatinine, Ser: 7.24 mg/dL — ABNORMAL HIGH (ref 0.44–1.00)
GFR, Estimated: 6 mL/min — ABNORMAL LOW (ref >60)
Glucose, Bld: 209 mg/dL — ABNORMAL HIGH (ref 70–99)
Phosphorus: 5.3 mg/dL — ABNORMAL HIGH (ref 2.5–4.6)
Potassium: 4.4 mmol/L (ref 3.5–5.1)
Sodium: 130 mmol/L — ABNORMAL LOW (ref 135–145)

## 2021-09-21 LAB — PROCALCITONIN: Procalcitonin: 11.31 ng/mL

## 2021-09-21 LAB — CBC
HCT: 26.1 % — ABNORMAL LOW (ref 36.0–46.0)
Hemoglobin: 8.4 g/dL — ABNORMAL LOW (ref 12.0–15.0)
MCH: 30.3 pg (ref 26.0–34.0)
MCHC: 32.2 g/dL (ref 30.0–36.0)
MCV: 94.2 fL (ref 80.0–100.0)
Platelets: 296 10*3/uL (ref 150–400)
RBC: 2.77 MIL/uL — ABNORMAL LOW (ref 3.87–5.11)
RDW: 21.2 % — ABNORMAL HIGH (ref 11.5–15.5)
WBC: 29.1 10*3/uL — ABNORMAL HIGH (ref 4.0–10.5)
nRBC: 1.2 % — ABNORMAL HIGH (ref 0.0–0.2)

## 2021-09-21 LAB — LACTIC ACID, PLASMA
Lactic Acid, Venous: 2.8 mmol/L (ref 0.5–1.9)
Lactic Acid, Venous: 1.4 mmol/L (ref 0.5–1.9)

## 2021-09-21 LAB — PROTIME-INR
INR: 4.7 (ref 0.8–1.2)
Prothrombin Time: 44.3 seconds — ABNORMAL HIGH (ref 11.4–15.2)

## 2021-09-21 LAB — APTT: aPTT: 40 seconds — ABNORMAL HIGH (ref 24–36)

## 2021-09-21 LAB — MRSA NEXT GEN BY PCR, NASAL: MRSA by PCR Next Gen: NOT DETECTED

## 2021-09-21 MED ORDER — SODIUM CHLORIDE 0.9 % IV BOLUS
250.0000 mL | Freq: Once | INTRAVENOUS | Status: AC
  Administered 2021-09-21: 250 mL via INTRAVENOUS

## 2021-09-21 MED ORDER — VANCOMYCIN HCL IN DEXTROSE 1-5 GM/200ML-% IV SOLN
1000.0000 mg | INTRAVENOUS | Status: DC
  Administered 2021-09-23 – 2021-09-28 (×3): 1000 mg via INTRAVENOUS
  Filled 2021-09-21 (×3): qty 200

## 2021-09-21 MED ORDER — LACTATED RINGERS IV BOLUS (SEPSIS): 1000.0000 mL | Freq: Once | INTRAVENOUS | Status: DC

## 2021-09-21 MED ORDER — METRONIDAZOLE 500 MG/100ML IV SOLN
500.0000 mg | Freq: Two times a day (BID) | INTRAVENOUS | Status: AC
  Administered 2021-09-21 – 2021-09-27 (×14): 500 mg via INTRAVENOUS
  Filled 2021-09-21 (×14): qty 100

## 2021-09-21 MED ORDER — HEPARIN SODIUM (PORCINE) 1000 UNIT/ML DIALYSIS
3000.0000 [IU] | INTRAMUSCULAR | Status: DC | PRN
  Administered 2021-09-30: 3000 [IU] via INTRAVENOUS_CENTRAL

## 2021-09-21 MED ORDER — VANCOMYCIN HCL IN DEXTROSE 1-5 GM/200ML-% IV SOLN
1000.0000 mg | INTRAVENOUS | Status: DC
Start: 2021-09-21 — End: 2021-09-21

## 2021-09-21 MED ORDER — VANCOMYCIN HCL 2000 MG/400ML IV SOLN
2000.0000 mg | Freq: Once | INTRAVENOUS | Status: AC
  Administered 2021-09-21: 2000 mg via INTRAVENOUS
  Filled 2021-09-21 (×2): qty 400

## 2021-09-21 MED ORDER — SODIUM CHLORIDE 0.9 % IV SOLN
2.0000 g | INTRAVENOUS | Status: DC
  Administered 2021-09-23 – 2021-09-28 (×3): 2 g via INTRAVENOUS
  Filled 2021-09-21 (×3): qty 2

## 2021-09-21 MED ORDER — NOREPINEPHRINE 4 MG/250ML-% IV SOLN
2.0000 ug/min | INTRAVENOUS | Status: DC
  Administered 2021-09-21: 2 ug/min via INTRAVENOUS
  Administered 2021-09-22: 5 ug/min via INTRAVENOUS
  Administered 2021-09-23: 7 ug/min via INTRAVENOUS
  Administered 2021-09-23: 6 ug/min via INTRAVENOUS
  Administered 2021-09-24: 3 ug/min via INTRAVENOUS
  Administered 2021-09-25 – 2021-09-30 (×4): 2 ug/min via INTRAVENOUS
  Filled 2021-09-21 (×11): qty 250

## 2021-09-21 MED ORDER — LACTATED RINGERS IV BOLUS (SEPSIS): 800.0000 mL | Freq: Once | INTRAVENOUS | Status: DC

## 2021-09-21 MED ORDER — HEPARIN SODIUM (PORCINE) 1000 UNIT/ML IJ SOLN
INTRAMUSCULAR | Status: AC
  Administered 2021-09-21: 3000 [IU] via INTRAVENOUS_CENTRAL
  Filled 2021-09-21: qty 3

## 2021-09-21 MED ORDER — SODIUM CHLORIDE 0.9 % IV SOLN
2.0000 g | Freq: Once | INTRAVENOUS | Status: AC
  Administered 2021-09-21: 2 g via INTRAVENOUS
  Filled 2021-09-21: qty 2

## 2021-09-21 MED ORDER — MIDODRINE HCL 5 MG PO TABS
ORAL_TABLET | ORAL | Status: AC
  Administered 2021-09-21: 15 mg via ORAL
  Filled 2021-09-21: qty 3

## 2021-09-21 MED ORDER — SODIUM CHLORIDE 0.9 % IV SOLN
250.0000 mL | INTRAVENOUS | Status: DC
  Administered 2021-09-23 – 2021-10-02 (×2): 250 mL via INTRAVENOUS

## 2021-09-21 NOTE — Progress Notes (Signed)
Pt in dialysis when code sepsis called. Pt is not to receive fluid bolus per Dr. Moshe Cipro and antibiotics would be dialyzed out therefore they can not be administered at that time.

## 2021-09-21 NOTE — Progress Notes (Signed)
Pharmacy Antibiotic Note  [name] is a 54 y.o. female admitted on 09/17/2021 with  unknown source .  Pharmacy has been consulted for Vancomycin and cefepime dosing.  Plan: Vancomycin 2076m IV loading dose, then 10021mafter each HD session on [facility] Cefepime 2gm IV after HD on MWF. F/u cxs and clinical progress Monitor V/S, labs and levels as indicated  Height: 5' 5" (165.1 cm) Weight: 131 kg (288 lb 12.8 oz) IBW/kg (Calculated) : 57  Temp (24hrs), Avg:98.1 F (36.7 C), Min:97.8 F (36.6 C), Max:98.3 F (36.8 C)  Recent Labs  Lab 09/17/21 1325 09/17/21 1433 09/18/21 0620 09/19/21 0813 09/19/21 1006 09/20/21 0511 09/21/21 0652 09/21/21 1124  WBC 10.4  --  9.9  --   --   --  29.1*  --   CREATININE 8.88*  --  9.30* 6.19* 6.04* 6.43* 7.24*  --   LATICACIDVEN 1.3 1.3  --   --   --   --   --  2.8*    Estimated Creatinine Clearance: 12 mL/min (A) (by C-G formula based on SCr of 7.24 mg/dL (H)).    Allergies  Allergen Reactions   Contrast Media [Iodinated Diagnostic Agents] Anaphylaxis, Hives, Swelling and Other (See Comments)    Dye for cardiac cath. Tongue swells   Pneumococcal Vaccines Swelling and Other (See Comments)    Turns skin black, and bodily swelling   Vancomycin Nausea And Vomiting and Other (See Comments)    Infusion "made me feel like I was dying" had to be readmitted to hospital    Antimicrobials this admission: Vancomycin 10/3 >>  Cefepime  10/3 >>  Flagyl 10/3>>  Microbiology results: 10/3 BCx: pending 10/3 UCx: pending   MRSA PCR:   Thank you for allowing pharmacy to be a part of this patient's care.  LoIsac SarnaBS Pharm D, BCPS Clinical Pharmacist Pager #[phone]/02/2021 1:50 PM

## 2021-09-21 NOTE — Procedures (Signed)
Patient was seen on dialysis and the procedure was supervised.  BFR 350  Via AVF BP is  106/60.   Patient was found to be hypotensive-  currently on levophed 10 mcg continuous drip-  have extended treatment time to 4 hours-  trying to remove volume as able.  She is arousable and tracks but clearly not as resposive as she was when I saw her last on Friday   Louis Meckel 09/21/2021

## 2021-09-21 NOTE — Progress Notes (Signed)
Code sepsis completed

## 2021-09-21 NOTE — Progress Notes (Deleted)
Pt is in dialysis and code sepsis orders are not being carried out, made MD aware

## 2021-09-21 NOTE — Progress Notes (Signed)
Elink following for code sepsis

## 2021-09-21 NOTE — Progress Notes (Signed)
PROGRESS NOTE    DANYELLA MCGINTY  NLZ:[id number] DOB: 03-Apr-1966 DOA: 09/17/2021 PCP: Noreene Larsson, NP    Brief Narrative:  ROBERTTA HALFHILL is a 55 year old female with past medical history significant for essential hypertension, ESRD on HD (MWF), chronic diastolic congestive heart failure, COPD, type 2 diabetes mellitus, chronic lymphedema, morbid obesity who presents to [facility], ED on 9/29 with worsening left thigh infection, generalized weakness/fatigue in the setting of missed dialysis sessions.  Patient been complaining of worsening pain and purulent foul-smelling discharge from her wound site.  Was seen by her wound care nurse recommended that she be evaluated in the ED.  Denies fever/chills, no chest pain, no shortness of breath.  Patient reports missing 2 dialysis sessions within the last week, last session on Friday, 09/11/2021 due to being bedbound and no assistance with going to the dialysis center.  Patient recently admitted on 7/3 and discharged on 9/5 due to left medial thigh wound drain in which she went I&D on 8/31 by plastic surgery and wound VAC placement.  During the hospitalization she developed septic shock and CHF exacerbation.  She was advised to discharge to SNF but patient refused and subsequently went home.  In the ED, temperature 97.8 F, HR 98, RR 30, BP 142/112, SPO2 100% on room air.  Sodium 129, potassium 5.9, chloride 88, CO2 21, glucose 106, BUN 105, creatinine 8.88.  Magnesium 2.2, AST 15, ALT 16, total bilirubin 1.2.  BNP 262.0.  Lactic acid 1.3.  WBC 10.4, hemoglobin 10.0, platelets 295.  COVID-19 PCR negative.  Influenza A/B PCR negative.  Chest x-ray with marked cardiomegaly with mild pulmonary interstitial edema.  Left femur x-ray with no acute osseous abnormality or soft tissue gas identified.  Wound VAC was removed by EDP with some mild bleeding due to rupture of a superficial vein that was controlled with direct pressure, sutures and wrapped with a  compression wrap.  Vascular surgery was consulted and recommended packing the wound and applying compression wrap and only need for transfer to [facility] if bleeding uncontrolled.  Plastic surgery was also consulted and recommend discontinue the wound VAC and placing Xeroform gauze over the wound and changing dressing daily; with outpatient follow-up.  Nephrologist consulted regarding need for dialysis given hyperkalemia and uremia.  TRH consulted for further evaluation and management.   Assessment & Plan:   Principal Problem:   Open wound of left thigh Active Problems:   Lymphedema of lower extremity   Diabetes mellitus (HCC)   Hyperkalemia   HTN (hypertension)   HLD (hyperlipidemia)   ESRD on dialysis (HCC)   Lymphedema   Obesity, Class III, BMI 40-49.9 (morbid obesity) (HCC)   Hyponatremia   Hypoalbuminemia due to protein-calorie malnutrition (HCC)   Elevated brain natriuretic peptide (BNP) level   Atrial fibrillation, chronic (HCC)   Pulmonary edema   Prolonged QT interval   Elevated alkaline phosphatase in newborn   Acute metabolic encephalopathy Patient with confusion this morning, notable elevated WBC count from 9.9 to 29.1.  Concern for worsening infection regarding left thigh wound.  Also with hypotension despite her home midodrine.  Severe sepsis/Septic shock, not POA Left thigh wound Patient follows with plastic surgery outpatient, Dr. Marla Roe.  Underwent recent incision and drainage on 08/19/2021 with wound VAC placement.  Was recently seen by home health wound nurse for concern of worsening wound.  Patient is afebrile, no leukocytosis, lactic acid within normal limits.  X-ray with no concerning findings of left femur on admission.  Plastic surgery was consulted on admission and reviewed images and recommended discontinuation of wound VAC with Xeroform dressings changed once daily and outpatient follow-up. --WBC 9.9>>29.1 --Lactic acid 2.8 --Procalcitonin  11.31 --Transfer to ICU, start peripheral Levophed --CT left femur without contrast (unable to obtain MRI due to body habitus or CT w/ contrast due to allergy. --Not receiving IV fluid bolus due to being volume overload on HD per nephrology --Blood cultures x2 ordered --Start empiric antibiotics with vancomycin, cefepime, metronidazole --Continue dressing daily changes  --CBC, procalcitonin, lactic acid daily --If significant infectious process or abscess noted on imaging, may need transfer to [facility] for plastic surgery evaluation for possible need of debridement  Hyperkalemia Hyponatremia Uremia Patient presenting to the ED after several missed HD sessions.  Patient dialyzes at [facility] on a Monday/Wednesday/Friday schedule.  Last HD session on Friday 9/23.  On arrival to the ED, patient was noted to have an elevated creatinine of 5.9 with a BUN of 105.  In the ED, patient was given sodium bicarbonate, Lokelma.  Nephrology was consulted and patient underwent hemodialysis on 09/18/2021. --Potassium this morning 4.4 --Receiving HD today --BMP daily, monitor on telemetry  Acute on chronic diastolic congestive heart failure Patient presenting with mild shortness of breath.  TTE 06/22/2021 with LVEF 60 to 42%, grade 1 diastolic dysfunction, RVSP 68.9, RA severely dilated, IVC dilated.  Elevated BNP and chest x-ray with pulmonary edema on admission.  Etiology likely secondary to noncompliance with outpatient hemodialysis. --Continue volume management with HD; next day  ESRD on HD MWF --Nephrology following -- HD planned for today  Paroxysmal atrial fibrillation --Continue apixaban --Amiodarone 251m PO daily  Prolonged QTC EKG with QTC 509 MS. --restarted amiodarone --Continue monitor on telemetry  Hx Chronic hypotension --Started on Levophed as above --Continue home midodrine 116mPO TID --Continue monitor blood pressure closely  Type 2 diabetes mellitus Diet controlled  at home.  Recent hemoglobin A1c 6.9, well controlled.  Morbid obesity Body mass index is 48.06 kg/m.  Discussed with patient needs for aggressive lifestyle changes/weight loss as this complicates all facets of care.  Outpatient follow-up with PCP.  May benefit from bariatric evaluation outpatient.  Weakness/Fatigue/deconditioning: [facility], recommended SNF but patient declined.  Discussed with patient's if therapy recommends SNF placement will she consider, she declines once again. --PT/OT recommends SNF; but refuses --Continue therapy efforts while inpatient    DVT prophylaxis: SCDs Start: 09/17/21 1939 apixaban (ELIQUIS) tablet 5 mg    Code Status: Full Code Family Communication: No family present at bedside this morning  Disposition Plan:  Level of care: ICU Status is: Inpatient  Remains inpatient appropriate because:Persistent severe electrolyte disturbances, Ongoing diagnostic testing needed not appropriate for outpatient work up, Unsafe d/c plan, IV treatments appropriate due to intensity of illness or inability to take PO, and Inpatient level of care appropriate due to severity of illness  Dispo: The patient is from: Home              Anticipated d/c is to: Home              Patient currently is not medically stable to d/c.   Difficult to place patient No   Consultants:  Nephrology, Dr. GoMoshe Ciproascular surgery and plastic surgery - discussed with EDP  Procedures:  Sutures to left thigh wound placed by EDP on admission  Antimicrobials:  Vancomycin 10/3>> Cefepime 10/3>> Metronidazole 10/3>>   Subjective: Patient seen examined bedside.  Patient stating  that she needs to go to the bathroom.  Appears confused, which is different than exam yesterday.  WBC count now up to 29.1, was 9.9 on admission.  Concern for worsening infection of her left thigh wound.  Also hypotensive.  Will transfer to ICU placed on peripheral Levophed  drip.  Unable to obtain MR due to body habitus and has allergy to IV contrast; obtaining CT without contrast for further evaluation of left thigh wound.  Patient has been refusing to allow nursing staff to change her dressing this morning.  No acute events overnight per nursing staff.  Objective: Vitals:   09/21/21 1230 09/21/21 1245 09/21/21 1300 09/21/21 1315  BP: 109/71 102/72 108/68 (!) 110/58  Pulse: (!) 101 (!) 108 (!) 114 (!) 114  Resp: 19 18 (!) 23 18  Temp:      TempSrc:      SpO2:      Weight:      Height:        Intake/Output Summary (Last 24 hours) at 09/21/2021 1326 Last data filed at 09/21/2021 0339 Gross per 24 hour  Intake 1550 ml  Output --  Net 1550 ml   Filed Weights   09/20/21 0614 09/21/21 0500 09/21/21 1014  Weight: 130.1 kg 131 kg 131 kg    Examination:  General exam: Appears calm and comfortable, obese, chronically ill in appearance, confused Respiratory system: Decreased breath sounds bilateral bases, otherwise clear to auscultation. Respiratory effort normal.  On room air Cardiovascular system: S1 & S2 heard, RRR. No JVD, murmurs, rubs, gallops or clicks.  Noted significant lymphedema, stable Gastrointestinal system: Abdomen is nondistended, soft and nontender. No organomegaly or masses felt. Normal bowel sounds heard. Central nervous system: Alert, confused. No focal neurological deficits. Extremities: Symmetric 5 x 5 power. Skin: Left thigh wound noted, dressing in place with Kerlix, clean/dry/intact; noted bilateral lower extremity lymphedema which is at baseline, otherwise no rashes, lesions or ulcers Psychiatry: Judgement and insight appear poor. Mood & affect appropriate.     Data Reviewed: I have personally reviewed following labs and imaging studies  CBC: Recent Labs  Lab 09/17/21 1325 09/18/21 0620 09/21/21 0652  WBC 10.4 9.9 29.1*  NEUTROABS 8.7*  --   --   HGB 10.0* 9.0* 8.4*  HCT 30.9* 26.8* 26.1*  MCV 92.5 91.2 94.2  PLT 295  298 191   Basic Metabolic Panel: Recent Labs  Lab 09/17/21 1325 09/18/21 0620 09/19/21 0813 09/19/21 1006 09/20/21 0511 09/21/21 0652  NA 127* 130* 132* 132* 130* 130*  K 5.9* 6.3* 6.6* 4.9 4.3 4.4  CL 88* 90* 91* 91* 92* 94*  CO2 21* 20* 14* 14* 22 19*  GLUCOSE 106* 79 <20* 93 180* 209*  BUN 105* 114* 61* 62* 71* 84*  CREATININE 8.88* 9.30* 6.19* 6.04* 6.43* 7.24*  CALCIUM 8.2* 8.1* 8.5* 8.4* 8.2* 8.2*  MG 2.2 2.2  --   --   --   --   PHOS  --  7.4* 8.0*  --  5.8* 5.3*   GFR: Estimated Creatinine Clearance: 12 mL/min (A) (by C-G formula based on SCr of 7.24 mg/dL (H)). Liver Function Tests: Recent Labs  Lab 09/17/21 1325 09/18/21 0620 09/19/21 0813 09/20/21 0511 09/21/21 0652  AST 15 15  --   --   --   ALT 16 14  --   --   --   ALKPHOS 254* 223*  --   --   --   BILITOT 1.2 1.5*  --   --   --  PROT 7.8 7.3  --   --   --   ALBUMIN 3.3* 3.2* 3.3* 3.2* 2.8*   No results for input(s): LIPASE, AMYLASE in the last 168 hours. No results for input(s): AMMONIA in the last 168 hours. Coagulation Profile: Recent Labs  Lab 09/18/21 0620 09/21/21 1124  INR 1.6* 4.7*   Cardiac Enzymes: No results for input(s): CKTOTAL, CKMB, CKMBINDEX, TROPONINI in the last 168 hours. BNP (last 3 results) No results for input(s): PROBNP in the last 8760 hours. HbA1C: No results for input(s): HGBA1C in the last 72 hours.  CBG: Recent Labs  Lab 09/19/21 0757 09/19/21 0835 09/19/21 1207 09/19/21 1735 09/20/21 2028  GLUCAP <10* 93 108* 245* 197*   Lipid Profile: No results for input(s): CHOL, HDL, LDLCALC, TRIG, CHOLHDL, LDLDIRECT in the last 72 hours. Thyroid Function Tests: No results for input(s): TSH, T4TOTAL, FREET4, T3FREE, THYROIDAB in the last 72 hours. Anemia Panel: No results for input(s): VITAMINB12, FOLATE, FERRITIN, TIBC, IRON, RETICCTPCT in the last 72 hours. Sepsis Labs: Recent Labs  Lab 09/17/21 1325 09/17/21 1433 09/21/21 0652 09/21/21 1124  PROCALCITON   --   --  11.31  --   LATICACIDVEN 1.3 1.3  --  2.8*    Recent Results (from the past 240 hour(s))  Blood culture (routine x 2)     Status: None (Preliminary result)   Collection Time: 09/17/21  1:25 PM   Specimen: Right Antecubital; Blood  Result Value Ref Range Status   Specimen Description RIGHT ANTECUBITAL  Final   Special Requests   Final    Blood Culture adequate volume BOTTLES DRAWN AEROBIC AND ANAEROBIC   Culture   Final    NO GROWTH 4 DAYS Performed at [facility], [address], [place], [place] [address]    Report Status PENDING  Incomplete  Resp Panel by RT-PCR (Flu A&B, Covid) Nasopharyngeal Swab     Status: None   Collection Time: 09/17/21  1:27 PM   Specimen: Nasopharyngeal Swab; Nasopharyngeal(NP) swabs in vial transport medium  Result Value Ref Range Status   SARS Coronavirus 2 by RT PCR NEGATIVE NEGATIVE Final    Comment: (NOTE) SARS-CoV-2 target nucleic acids are NOT DETECTED.  The SARS-CoV-2 RNA is generally detectable in upper respiratory specimens during the acute phase of infection. The lowest concentration of SARS-CoV-2 viral copies this assay can detect is 138 copies/mL. A negative result does not preclude SARS-Cov-2 infection and should not be used as the sole basis for treatment or other patient management decisions. A negative result may occur with  improper specimen collection/handling, submission of specimen other than nasopharyngeal swab, presence of viral mutation(s) within the areas targeted by this assay, and inadequate number of viral copies(<138 copies/mL). A negative result must be combined with clinical observations, patient history, and epidemiological information. The expected result is Negative.  Fact Sheet for Patients:  EntrepreneurPulse.com.au  Fact Sheet for Healthcare Providers:  IncredibleEmployment.be  This test is no t yet approved or cleared by the [place] FDA and  has been  authorized for detection and/or diagnosis of SARS-CoV-2 by FDA under an Emergency Use Authorization (EUA). This EUA will remain  in effect (meaning this test can be used) for the duration of the COVID-19 declaration under Section 564(b)(1) of the Act, 21 U.S.C.section 360bbb-3(b)(1), unless the authorization is terminated  or revoked sooner.       Influenza A by PCR NEGATIVE NEGATIVE Final   Influenza B by PCR NEGATIVE NEGATIVE Final    Comment: (NOTE)  The Xpert Xpress SARS-CoV-2/FLU/RSV plus assay is intended as an aid in the diagnosis of influenza from Nasopharyngeal swab specimens and should not be used as a sole basis for treatment. Nasal washings and aspirates are unacceptable for Xpert Xpress SARS-CoV-2/FLU/RSV testing.  Fact Sheet for Patients: EntrepreneurPulse.com.au  Fact Sheet for Healthcare Providers: IncredibleEmployment.be  This test is not yet approved or cleared by the [place] FDA and has been authorized for detection and/or diagnosis of SARS-CoV-2 by FDA under an Emergency Use Authorization (EUA). This EUA will remain in effect (meaning this test can be used) for the duration of the COVID-19 declaration under Section 564(b)(1) of the Act, 21 U.S.C. section 360bbb-3(b)(1), unless the authorization is terminated or revoked.  Performed at [facility], [address], [place], [place] [address]   Blood culture (routine x 2)     Status: None (Preliminary result)   Collection Time: 09/17/21  1:30 PM   Specimen: BLOOD  Result Value Ref Range Status   Specimen Description BLOOD  Final   Special Requests NONE  Final   Culture   Final    NO GROWTH 4 DAYS Performed at [facility], [address], [place], [place] [address]    Report Status PENDING  Incomplete  Culture, blood (routine x 2)     Status: None (Preliminary result)   Collection Time: 09/21/21 11:24 AM   Specimen: Hemodialysis Fistula; Blood  Result Value Ref  Range Status   Specimen Description   Final    HEMODIALYSIS FISTULA BOTTLES DRAWN AEROBIC AND ANAEROBIC   Special Requests   Final    Blood Culture adequate volume Performed at [facility], [address], [place], [place] [address]    Culture PENDING  Incomplete   Report Status PENDING  Incomplete  Culture, blood (routine x 2)     Status: None (Preliminary result)   Collection Time: 09/21/21 11:24 AM   Specimen: Hemodialysis Fistula; Blood  Result Value Ref Range Status   Specimen Description   Final    HEMODIALYSIS FISTULA BOTTLES DRAWN AEROBIC AND ANAEROBIC   Special Requests   Final    Blood Culture adequate volume Performed at [facility], [address] [place], [place] [address]    Culture PENDING  Incomplete   Report Status PENDING  Incomplete         Radiology Studies: No results found.      Scheduled Meds:  amiodarone  200 mg Oral Daily   apixaban  5 mg Oral BID   calcium acetate  1,334 mg Oral TID with meals   Chlorhexidine Gluconate Cloth  6 each Topical Q0600   darbepoetin (ARANESP) injection - DIALYSIS  100 mcg Intravenous Q Fri-HD   feeding supplement  237 mL Oral BID BM   heparin sodium (porcine)       influenza vac split quadrivalent PF  0.5 mL Intramuscular Tomorrow-1000   lanthanum  500 mg Oral TID WC   midodrine  15 mg Oral TID WC   multivitamin  1 tablet Oral QHS   pantoprazole  40 mg Oral BID   Continuous Infusions:  sodium chloride     sodium chloride     sodium chloride     albumin human Stopped (09/18/21 1912)   ceFEPime (MAXIPIME) IV     metronidazole     norepinephrine (LEVOPHED) Adult infusion 10 mcg/min (09/21/21 1130)   vancomycin       LOS: 4 days    Critical Care Time Upon my evaluation, this patient  had a high probability of imminent or life-threatening deterioration due to severe sepsis, hypotension, left thigh wound, which required my direct attention, intervention, and personal management.  I have personally provided  56 minutes of critical care time exclusive of my time spent on separately billable procedures.  Time includes review of laboratory data, radiology results, discussion with consultants, and monitoring for potential decompensation.       Psalms Olarte J [place] DO [facility] Available via Epic secure chat 7am-7pm After these hours, please refer to coverage provider listed on [email or web address] 09/21/2021, 1:26 PM

## 2021-09-21 NOTE — Procedures (Signed)
   HEMODIALYSIS TREATMENT NOTE:   Pre-HD pt was lethargic and non-conversant, 70/43 with MAP 55, afebrile, WBCC 29.1.  She was unable to hold up her arm to have BP cuff placed.  Code sepsis was initiated by Dr. [place]  Levophed was initiated via PIV by Eustaquio Maize, RN while labs + blood cultures were drawn from AVF.  BP^ to 114/77 with MAP 107 after Levophed was titrated to 85mg/min and HD was initiated at that time.  Tx time was extended (and LR boluses were dc'd) to maximize UF as pt is hypervolemic from fluid boluses administered over weekend for persistent hypotension.  Levophed was titrated to 124m/min within the first 30 minutes of the session.  4 hour treatment completed.  She remained groggy and minimally responsive throughout treatment, providing only one-word answers to queries.  She was able to swallow Midodrine 1533mut drifted back to sleep after.  Vancomycin 2g loading dose was administered during last 1.5 hours as directed by GarMal AmabileCefepime was administered via ECC with no dialysate flow at the end of treatment as Levophed is infusing through her one PIV.    Pt's husband, TimOctavia Bruckneras apprised of change in pt's condition/treatment and of her transfer to ICU after HD.  Total HD time: 4 hours Net UF 3.9 liters    AngRockwell AlexandriaN

## 2021-09-21 NOTE — Progress Notes (Signed)
Subjective:  No major events over weekend-  plan for HD today -  given many boluses for low BP.  She is struggling to re adjust in bed this AM-  when I asked if I could help-  she asked for ice-  I asked her other questions and all she said was that she wanted ice ?   Objective Vital signs in last 24 hours: Vitals:   09/20/21 2305 09/21/21 0252 09/21/21 0358 09/21/21 0500  BP:  (!) 75/58 102/66   Pulse: 100 100 100   Resp: _0 Temp: 98.2 F (36.8 C) 98 F (36.7 C) 98.1 F (36.7 C)   TempSrc: Oral Oral Oral   SpO2: 98% 98% 98%   Weight:    131 kg  Height:       Weight change: 0.9 kg  Intake/Output Summary (Last 24 hours) at 09/21/2021 0900 Last data filed at 09/21/2021 9201 Gross per 24 hour  Intake 1550 ml  Output --  Net 1550 ml    Dialyzes at [facility]  MWF-  has not been there since last Friday  EDW 122.5/  4 hours and 15 minutes/AVF-  15 gauge/1K bath/2.5 calc/BFR 300. Calcitriol 0.5 q treatment -  no ESA-  on last Friday left out at 121.8-  heparin 1000 bolus and q hour    Assessment/Plan: 55 year old BF with ESRD, many other problems including lymphedema and chronic thigh wound who presents to hospital with increased thigh wound pain/nausea and missed HD  1 thigh wound-  per primary-  local wound care planned  2 ESRD: normally MWF via AVF-  had not had treatment since 9/23.  Received HD on 9/30 -   Next treatment planned for today 3 Hypertension: is overloaded but hypotensive -  midodrine and albumin with UF as able.  PLEASE do not bolus with NS unless in distress as is overloaded demonstrated by low sodium 4. Anemia of ESRD: not on ESA as OP?  Hgb 9-  have added 100 q Friday 5. Metabolic Bone Disease: continue calcitriol 0.5 TIW-  phos is good on fosrenol 6. Hyperkalemia -  seems to be a chronic issue-  on 1 K bath as OP-  given lokelma and bicarb but went up-  1 k bath Friday in HD -  has maintained over weekend likely has a lower K diet here  [place]: Basic Metabolic Panel: Recent Labs  Lab 09/19/21 0813 09/19/21 1006 09/20/21 0511 09/21/21 0652  NA 132* 132* 130* 130*  K 6.6* 4.9 4.3 4.4  CL 91* 91* 92* 94*  CO2 14* 14* 22 19*  GLUCOSE <20* 93 180* 209*  BUN 61* 62* 71* 84*  CREATININE 6.19* 6.04* 6.43* 7.24*  CALCIUM 8.5* 8.4* 8.2* 8.2*  PHOS 8.0*  --  5.8* 5.3*   Liver Function Tests: Recent Labs  Lab 09/17/21 1325 09/18/21 0620 09/19/21 0813 09/20/21 0511 09/21/21 0652  AST 15 15  --   --   --   ALT 16 14  --   --   --   ALKPHOS 254* 223*  --   --   --   BILITOT 1.2 1.5*  --   --   --   PROT 7.8 7.3  --   --   --   ALBUMIN 3.3* 3.2* 3.3* 3.2* 2.8*   No results for input(s): LIPASE, AMYLASE in the last 168 hours. No results for input(s): AMMONIA in the  last 168 hours. CBC: Recent Labs  Lab 09/17/21 1325 09/18/21 0620 09/21/21 0652  WBC 10.4 9.9 29.1*  NEUTROABS 8.7*  --   --   HGB 10.0* 9.0* 8.4*  HCT 30.9* 26.8* 26.1*  MCV 92.5 91.2 94.2  PLT 295 298 296   Cardiac Enzymes: No results for input(s): CKTOTAL, CKMB, CKMBINDEX, TROPONINI in the last 168 hours. CBG: Recent Labs  Lab 09/19/21 0757 09/19/21 0835 09/19/21 1207 09/19/21 1735 09/20/21 2028  GLUCAP <10* 93 108* 245* 197*    Iron Studies: No results for input(s): IRON, TIBC, TRANSFERRIN, FERRITIN in the last 72 hours. Studies/Results: No results found. Medications: Infusions:  sodium chloride     sodium chloride     albumin human Stopped (09/18/21 1912)    Scheduled Medications:  amiodarone  200 mg Oral Daily   apixaban  5 mg Oral BID   calcium acetate  1,334 mg Oral TID with meals   Chlorhexidine Gluconate Cloth  6 each Topical Q0600   darbepoetin (ARANESP) injection - DIALYSIS  100 mcg Intravenous Q Fri-HD   feeding supplement  237 mL Oral BID BM   influenza vac split quadrivalent PF  0.5 mL Intramuscular Tomorrow-1000   lanthanum  500 mg Oral TID WC   midodrine  15 mg Oral TID WC   multivitamin  1  tablet Oral QHS   pantoprazole  40 mg Oral BID    have reviewed scheduled and prn medications.  Physical Exam: General:  eyes wide open-  repeating request for ice  Heart: tachy Lungs: decreased effort Abdomen:  obese, soft, non tender Extremities: pitting edema-  left thigh bandaged Dialysis Access: AVF-  good thrill and bruit     09/21/2021,9:00 AM  LOS: 4 days

## 2021-09-21 NOTE — Progress Notes (Signed)
Notified bedside nurse of need to administer antibiotics and fluid bolus. Bolus not given d/t pt is volume overloaded and currently receiving HD. Antibiotics will be given near the end of HD.

## 2021-09-21 NOTE — Consult Note (Signed)
[facility] Nurse Consult Note: Reason for Consult:Left medial thigh wound.  Was on NPWT (VAC) therapy prior to hospitalization but experienced excessive bleeding with VAC removal.  Plastics team was consulted and recommended to stop VAC and implement Xeroform gauze to wound daily instead.  Wound type: Pressure Injury POA: Yes/No/NA Measurement: 11 cm x 6 cm with 10% necrotic tissue to wound periphery.   Wound bed:90% ruddy red, bleeds with care and experienced excessive bleeding with removal of VAC today.  Was controlled with pressure.   Drainage (amount, consistency, odor) Moderate serosanguinous   Periwound: Darkened intact epithelial tissue.  Cellulitis with erythema and warmth Dressing procedure/placement/frequency: Per plastics team:  CLeanse left leg with NS and pat dry.  Apply Xeroform gauze to wound bed.  Cover with dry gauze and ABD pad.  Wrap with kerlix and tape.  Change daily.  Will not follow at this time.  Please re-consult if needed.  Domenic Moras MSN, RN, FNP-BC CWON Wound, Ostomy, Continence Nurse Pager [phone]

## 2021-09-21 NOTE — Progress Notes (Signed)
Patient refused dressing change in this morning.

## 2021-09-21 NOTE — Progress Notes (Signed)
Notified bedside nurse of need to order repeat lactic acid.

## 2021-09-22 ENCOUNTER — Inpatient Hospital Stay (HOSPITAL_COMMUNITY): Payer: 59

## 2021-09-22 ENCOUNTER — Ambulatory Visit: Payer: 59 | Admitting: Physician Assistant

## 2021-09-22 DIAGNOSIS — Z992 Dependence on renal dialysis: Secondary | ICD-10-CM | POA: Diagnosis not present

## 2021-09-22 DIAGNOSIS — S71102D Unspecified open wound, left thigh, subsequent encounter: Secondary | ICD-10-CM | POA: Diagnosis not present

## 2021-09-22 DIAGNOSIS — N186 End stage renal disease: Secondary | ICD-10-CM | POA: Diagnosis not present

## 2021-09-22 LAB — CBC WITH DIFFERENTIAL/PLATELET
Abs Immature Granulocytes: 0.66 10*3/uL — ABNORMAL HIGH (ref 0.00–0.07)
Basophils Absolute: 0.1 10*3/uL (ref 0.0–0.1)
Basophils Relative: 0 %
Eosinophils Absolute: 0 10*3/uL (ref 0.0–0.5)
Eosinophils Relative: 0 %
HCT: 28.1 % — ABNORMAL LOW (ref 36.0–46.0)
Hemoglobin: 9.3 g/dL — ABNORMAL LOW (ref 12.0–15.0)
Immature Granulocytes: 3 %
Lymphocytes Relative: 5 %
Lymphs Abs: 1.3 10*3/uL (ref 0.7–4.0)
MCH: 30.5 pg (ref 26.0–34.0)
MCHC: 33.1 g/dL (ref 30.0–36.0)
MCV: 92.1 fL (ref 80.0–100.0)
Monocytes Absolute: 1.1 10*3/uL — ABNORMAL HIGH (ref 0.1–1.0)
Monocytes Relative: 5 %
Neutro Abs: 21.2 10*3/uL — ABNORMAL HIGH (ref 1.7–7.7)
Neutrophils Relative %: 87 %
Platelets: 297 10*3/uL (ref 150–400)
RBC: 3.05 MIL/uL — ABNORMAL LOW (ref 3.87–5.11)
RDW: 21.2 % — ABNORMAL HIGH (ref 11.5–15.5)
WBC: 24.3 10*3/uL — ABNORMAL HIGH (ref 4.0–10.5)
nRBC: 0.9 % — ABNORMAL HIGH (ref 0.0–0.2)

## 2021-09-22 LAB — RENAL FUNCTION PANEL
Albumin: 3 g/dL — ABNORMAL LOW (ref 3.5–5.0)
Anion gap: 15 (ref 5–15)
BUN: 49 mg/dL — ABNORMAL HIGH (ref 6–20)
CO2: 24 mmol/L (ref 22–32)
Calcium: 8.3 mg/dL — ABNORMAL LOW (ref 8.9–10.3)
Chloride: 91 mmol/L — ABNORMAL LOW (ref 98–111)
Creatinine, Ser: 5.11 mg/dL — ABNORMAL HIGH (ref 0.44–1.00)
GFR, Estimated: 9 mL/min — ABNORMAL LOW (ref >60)
Glucose, Bld: 193 mg/dL — ABNORMAL HIGH (ref 70–99)
Phosphorus: 4.2 mg/dL (ref 2.5–4.6)
Potassium: 3.2 mmol/L — ABNORMAL LOW (ref 3.5–5.1)
Sodium: 130 mmol/L — ABNORMAL LOW (ref 135–145)

## 2021-09-22 LAB — CULTURE, BLOOD (ROUTINE X 2)
Culture: NO GROWTH
Culture: NO GROWTH
Special Requests: ADEQUATE
Special Requests: ADEQUATE

## 2021-09-22 LAB — PROCALCITONIN: Procalcitonin: 14.88 ng/mL

## 2021-09-22 MED ORDER — ONDANSETRON HCL 4 MG/2ML IJ SOLN
4.0000 mg | Freq: Four times a day (QID) | INTRAMUSCULAR | Status: DC | PRN
  Administered 2021-09-22 – 2021-10-01 (×17): 4 mg via INTRAVENOUS
  Filled 2021-09-22 (×17): qty 2

## 2021-09-22 MED ORDER — POTASSIUM CHLORIDE CRYS ER 20 MEQ PO TBCR: 40.0000 meq | EXTENDED_RELEASE_TABLET | Freq: Two times a day (BID) | ORAL | Status: DC

## 2021-09-22 MED ORDER — SODIUM CHLORIDE 0.9% FLUSH: 10.0000 mL | INTRAVENOUS | Status: DC | PRN

## 2021-09-22 MED ORDER — POTASSIUM CHLORIDE CRYS ER 20 MEQ PO TBCR
40.0000 meq | EXTENDED_RELEASE_TABLET | Freq: Once | ORAL | Status: AC
  Administered 2021-09-22: 40 meq via ORAL
  Filled 2021-09-22: qty 2

## 2021-09-22 MED ORDER — METOPROLOL TARTRATE 5 MG/5ML IV SOLN
5.0000 mg | INTRAVENOUS | Status: DC | PRN
  Administered 2021-09-22 – 2021-10-01 (×3): 5 mg via INTRAVENOUS
  Filled 2021-09-22 (×3): qty 5

## 2021-09-22 MED ORDER — SODIUM CHLORIDE 0.9% FLUSH
10.0000 mL | Freq: Two times a day (BID) | INTRAVENOUS | Status: DC
  Administered 2021-09-22 (×2): 10 mL
  Administered 2021-09-23: 20 mL
  Administered 2021-09-24 – 2021-09-26 (×6): 10 mL
  Administered 2021-09-27: 20 mL
  Administered 2021-09-27 – 2021-09-28 (×3): 10 mL
  Administered 2021-09-29: 30 mL
  Administered 2021-09-29 – 2021-10-01 (×3): 10 mL
  Administered 2021-10-01: 30 mL
  Administered 2021-10-02 – 2021-10-03 (×4): 10 mL

## 2021-09-22 MED ORDER — CHLORHEXIDINE GLUCONATE CLOTH 2 % EX PADS
6.0000 | MEDICATED_PAD | Freq: Every day | CUTANEOUS | Status: DC
  Administered 2021-09-23 – 2021-09-25 (×3): 6 via TOPICAL

## 2021-09-22 NOTE — Op Note (Signed)
Patient:  [name]  DOB:  04/04/1966  MRN:  [id number]   Preop Diagnosis: Hypotension, need for central venous access, end-stage renal disease  Postop Diagnosis: Same  Procedure: Central line placement  Surgeon: Aviva Signs, MD  Anes: Local  Indications: Patient is a 55 year old black female with multiple medical problems including hypotension requiring Levophed administration, end-stage renal disease on dialysis, and hypercoagulability.  Due to all these factors, a femoral vein approach will be performed on the right side.  She has had surgery and wound VAC in the left groin region.  The risks and benefits of the procedure were fully explained to the patient, who gave informed consent.  Procedure note: The procedure was performed in the ICU bed 4.  A timeout was performed.  The right groin region was prepped and draped using the usual sterile technique with ChloraPrep.  1% Xylocaine was used for local anesthesia.  The needle was advanced into the right femoral vein using the Seldinger technique without difficulty.  A guidewire was then advanced.  An introducer was placed over the guidewire.  A triple-lumen catheter was then inserted over the guidewire and the guidewire was removed.  Good backflow of venous blood was noted on aspiration of all 3 ports.  All 3 ports were flushed with saline.  It was secured in the place using 3-0 silk sutures.  A dry sterile dressing was applied.  The patient tolerated the procedure well.  Complications: None  EBL: Minimal  Specimen: None

## 2021-09-22 NOTE — Progress Notes (Addendum)
PROGRESS NOTE    [name]  WGN:[id number] DOB: [birth year] DOA: 09/17/2021 PCP: Noreene Larsson, NP   Brief Narrative:   [name] is a 55 year old female with past medical history significant for essential hypertension, ESRD on HD (MWF), chronic diastolic congestive heart failure, COPD, type 2 diabetes mellitus, chronic lymphedema, morbid obesity who presents to [facility], ED on 9/29 with worsening left thigh infection, generalized weakness/fatigue in the setting of missed dialysis sessions.  Patient been complaining of worsening pain and purulent foul-smelling discharge from her wound site.  She is recommended by her surgeons to have ongoing wound care, but developed septic shock likely related to left thigh wound with associated acute metabolic encephalopathy on 10/3 for which she currently remains on norepinephrine as well as multiple antibiotics.  Assessment & Plan:   Principal Problem:   Open wound of left thigh Active Problems:   Lymphedema of lower extremity   Diabetes mellitus (HCC)   Hyperkalemia   HTN (hypertension)   HLD (hyperlipidemia)   ESRD on dialysis (HCC)   Lymphedema   Obesity, Class III, BMI 40-49.9 (morbid obesity) (HCC)   Severe sepsis with septic shock (HCC)   Hyponatremia   Hypoalbuminemia due to protein-calorie malnutrition (HCC)   Elevated brain natriuretic peptide (BNP) level   Atrial fibrillation, chronic (HCC)   Pulmonary edema   Prolonged QT interval   Elevated alkaline phosphatase in newborn   Septic shock secondary to left thigh wound, not POA -CT left femur pending with possible need to transfer as needed -Continue IV antibiotics as ordered -Continues to require norepinephrine, appreciate general surgery with placement of right femoral line -Procalcitonin rising  Acute metabolic encephalopathy secondary to above -Appears to be stable and possibly improving -Continue to maintain MAP greater than 60  Acute on chronic diastolic  congestive heart failure -Echocardiogram with LVEF 60-65% and grade 1 diastolic dysfunction on 06/27/4695 -Volume management per hemodialysis  ESRD on HD MWF -Nephrology following with hemodialysis done 10/3  Hyponatremia -Per nephrology with hemodialysis  Hypokalemia -Replete with oral supplementation and monitor  Paroxysmal atrial fibrillation -Continues on apixaban -Amiodarone 200 mg p.o. daily  History of chronic hypotension -Continues on Levophed as above, wean as tolerated -Continues home midodrine 15 mg p.o. 3 times daily  Type 2 diabetes Diet controlled at home with recent hemoglobin A1c 6.9%  Morbid obesity -Lifestyle changes outpatient  Weakness/fatigue/deconditioning -PT/OT recommending SNF, but refuses  DVT prophylaxis: Eliquis Code Status: Full Family Communication: None at bedside Disposition Plan:  Status is: Inpatient  Remains inpatient appropriate because:Hemodynamically unstable, IV treatments appropriate due to intensity of illness or inability to take PO, and Inpatient level of care appropriate due to severity of illness  Dispo: The patient is from: Home              Anticipated d/c is to: Home              Patient currently is not medically stable to d/c.   Difficult to place patient No  Nutritional Assessment:  The patient's BMI is: Body mass index is 46.7 kg/m.Marland Kitchen  Seen by dietician.  I agree with the assessment and plan as outlined below:  Nutrition Status:  Skin Assessment:  I have examined the patient's skin and I agree with the wound assessment as performed by the wound care RN as outlined below:  Pressure Injury 07/04/21 Thigh Posterior;Proximal;Right Stage 2 -  Partial thickness loss of dermis presenting as a shallow open injury with a red, pink  wound bed without slough. (Active)  07/04/21 1200  Location: Thigh (left thigh)  Location Orientation: Posterior;Proximal;Right  Staging: Stage 2 -  Partial thickness loss of dermis  presenting as a shallow open injury with a red, pink wound bed without slough.  Wound Description (Comments):   Present on Admission: No    Consultants:  Nephrology, Dr. Moshe Cipro Vascular surgery and plastic surgery - discussed with EDP  Procedures:  Sutures to left thigh wound placed by EDP on admission  Antimicrobials:  Vancomycin 10/3>> Cefepime 10/3>> Metronidazole 10/3>>  Subjective: Patient seen and evaluated today with no new acute complaints or concerns. No acute concerns or events noted overnight.  Objective: Vitals:   09/22/21 1100 09/22/21 1110 09/22/21 1200 09/22/21 1300  BP: 102/63  107/63 123/72  Pulse:      Resp: _0 Temp:  97.6 F (36.4 C)    TempSrc:  Oral    SpO2:      Weight:      Height:        Intake/Output Summary (Last 24 hours) at 09/22/2021 1422 Last data filed at 09/22/2021 1135 Gross per 24 hour  Intake 340 ml  Output 3926 ml  Net -3586 ml   Filed Weights   09/21/21 1014 09/21/21 1601 09/22/21 0500  Weight: 131 kg 128.3 kg 127.3 kg    Examination:  General exam: Appears calm and comfortable, obese Respiratory system: Clear to auscultation. Respiratory effort normal. Cardiovascular system: S1 & S2 heard, RRR.  Gastrointestinal system: Abdomen is soft Central nervous system: Alert and awake Extremities: No edema, left thigh wound with dressings clean dry and intact Skin: No significant lesions noted Psychiatry: Flat affect.    Data Reviewed: I have personally reviewed following labs and imaging studies  CBC: Recent Labs  Lab 09/17/21 1325 09/18/21 0620 09/21/21 0652 09/22/21 1227  WBC 10.4 9.9 29.1* 24.3*  NEUTROABS 8.7*  --   --  21.2*  HGB 10.0* 9.0* 8.4* 9.3*  HCT 30.9* 26.8* 26.1* 28.1*  MCV 92.5 91.2 94.2 92.1  PLT 295 298 296 427   Basic Metabolic Panel: Recent Labs  Lab 09/17/21 1325 09/18/21 0620 09/19/21 0813 09/19/21 1006 09/20/21 0511 09/21/21 0652 09/22/21 1227  NA 127* 130* 132* 132*  130* 130* 130*  K 5.9* 6.3* 6.6* 4.9 4.3 4.4 3.2*  CL 88* 90* 91* 91* 92* 94* 91*  CO2 21* 20* 14* 14* 22 19* 24  GLUCOSE 106* 79 <20* 93 180* 209* 193*  BUN 105* 114* 61* 62* 71* 84* 49*  CREATININE 8.88* 9.30* 6.19* 6.04* 6.43* 7.24* 5.11*  CALCIUM 8.2* 8.1* 8.5* 8.4* 8.2* 8.2* 8.3*  MG 2.2 2.2  --   --   --   --   --   PHOS  --  7.4* 8.0*  --  5.8* 5.3* 4.2   GFR: Estimated Creatinine Clearance: 16.7 mL/min (A) (by C-G formula based on SCr of 5.11 mg/dL (H)). Liver Function Tests: Recent Labs  Lab 09/17/21 1325 09/18/21 0620 09/19/21 0813 09/20/21 0511 09/21/21 0652 09/22/21 1227  AST 15 15  --   --   --   --   ALT 16 14  --   --   --   --   ALKPHOS 254* 223*  --   --   --   --   BILITOT 1.2 1.5*  --   --   --   --   PROT 7.8 7.3  --   --   --   --  ALBUMIN 3.3* 3.2* 3.3* 3.2* 2.8* 3.0*   No results for input(s): LIPASE, AMYLASE in the last 168 hours. No results for input(s): AMMONIA in the last 168 hours. Coagulation Profile: Recent Labs  Lab 09/18/21 0620 09/21/21 1124  INR 1.6* 4.7*   Cardiac Enzymes: No results for input(s): CKTOTAL, CKMB, CKMBINDEX, TROPONINI in the last 168 hours. BNP (last 3 results) No results for input(s): PROBNP in the last 8760 hours. HbA1C: No results for input(s): HGBA1C in the last 72 hours. CBG: Recent Labs  Lab 09/19/21 0757 09/19/21 0835 09/19/21 1207 09/19/21 1735 09/20/21 2028  GLUCAP <10* 93 108* 245* 197*   Lipid Profile: No results for input(s): CHOL, HDL, LDLCALC, TRIG, CHOLHDL, LDLDIRECT in the last 72 hours. Thyroid Function Tests: No results for input(s): TSH, T4TOTAL, FREET4, T3FREE, THYROIDAB in the last 72 hours. Anemia Panel: No results for input(s): VITAMINB12, FOLATE, FERRITIN, TIBC, IRON, RETICCTPCT in the last 72 hours. Sepsis Labs: Recent Labs  Lab 09/17/21 1325 09/17/21 1433 09/21/21 0652 09/21/21 1124 09/21/21 1258 09/22/21 1227  PROCALCITON  --   --  11.31  --   --  14.88  LATICACIDVEN 1.3  1.3  --  2.8* 1.4  --     Recent Results (from the past 240 hour(s))  Blood culture (routine x 2)     Status: None   Collection Time: 09/17/21  1:25 PM   Specimen: Right Antecubital; Blood  Result Value Ref Range Status   Specimen Description RIGHT ANTECUBITAL  Final   Special Requests   Final    Blood Culture adequate volume BOTTLES DRAWN AEROBIC AND ANAEROBIC   Culture   Final    NO GROWTH 5 DAYS Performed at [facility], [address], [place], [place] [address]    Report Status 09/22/2021 FINAL  Final  Resp Panel by RT-PCR (Flu A&B, Covid) Nasopharyngeal Swab     Status: None   Collection Time: 09/17/21  1:27 PM   Specimen: Nasopharyngeal Swab; Nasopharyngeal(NP) swabs in vial transport medium  Result Value Ref Range Status   SARS Coronavirus 2 by RT PCR NEGATIVE NEGATIVE Final    Comment: (NOTE) SARS-CoV-2 target nucleic acids are NOT DETECTED.  The SARS-CoV-2 RNA is generally detectable in upper respiratory specimens during the acute phase of infection. The lowest concentration of SARS-CoV-2 viral copies this assay can detect is 138 copies/mL. A negative result does not preclude SARS-Cov-2 infection and should not be used as the sole basis for treatment or other patient management decisions. A negative result may occur with  improper specimen collection/handling, submission of specimen other than nasopharyngeal swab, presence of viral mutation(s) within the areas targeted by this assay, and inadequate number of viral copies(<138 copies/mL). A negative result must be combined with clinical observations, patient history, and epidemiological information. The expected result is Negative.  Fact Sheet for Patients:  EntrepreneurPulse.com.au  Fact Sheet for Healthcare Providers:  IncredibleEmployment.be  This test is no t yet approved or cleared by the [place] FDA and  has been authorized for detection and/or diagnosis of SARS-CoV-2  by FDA under an Emergency Use Authorization (EUA). This EUA will remain  in effect (meaning this test can be used) for the duration of the COVID-19 declaration under Section 564(b)(1) of the Act, 21 U.S.C.section 360bbb-3(b)(1), unless the authorization is terminated  or revoked sooner.       Influenza A by PCR NEGATIVE NEGATIVE Final   Influenza B by PCR NEGATIVE NEGATIVE Final    Comment: (NOTE) The Xpert  Xpress SARS-CoV-2/FLU/RSV plus assay is intended as an aid in the diagnosis of influenza from Nasopharyngeal swab specimens and should not be used as a sole basis for treatment. Nasal washings and aspirates are unacceptable for Xpert Xpress SARS-CoV-2/FLU/RSV testing.  Fact Sheet for Patients: EntrepreneurPulse.com.au  Fact Sheet for Healthcare Providers: IncredibleEmployment.be  This test is not yet approved or cleared by the [place] FDA and has been authorized for detection and/or diagnosis of SARS-CoV-2 by FDA under an Emergency Use Authorization (EUA). This EUA will remain in effect (meaning this test can be used) for the duration of the COVID-19 declaration under Section 564(b)(1) of the Act, 21 U.S.C. section 360bbb-3(b)(1), unless the authorization is terminated or revoked.  Performed at [facility], [address], [place], [place] [address]   Blood culture (routine x 2)     Status: None   Collection Time: 09/17/21  1:30 PM   Specimen: Right Antecubital; Blood  Result Value Ref Range Status   Specimen Description RIGHT ANTECUBITAL  Final   Special Requests   Final    BOTTLES DRAWN AEROBIC AND ANAEROBIC Blood Culture adequate volume   Culture   Final    NO GROWTH 5 DAYS Performed at [facility], [address] [place], [place] [address]    Report Status 09/22/2021 FINAL  Final  Culture, blood (routine x 2)     Status: None (Preliminary result)   Collection Time: 09/21/21 11:24 AM   Specimen: Hemodialysis Fistula;  Blood  Result Value Ref Range Status   Specimen Description   Final    HEMODIALYSIS FISTULA BOTTLES DRAWN AEROBIC AND ANAEROBIC   Special Requests Blood Culture adequate volume  Final   Culture   Final    NO GROWTH < 24 HOURS Performed at [facility], [address], [place], [place] [address]    Report Status PENDING  Incomplete  Culture, blood (routine x 2)     Status: None (Preliminary result)   Collection Time: 09/21/21 11:24 AM   Specimen: Hemodialysis Fistula; Blood  Result Value Ref Range Status   Specimen Description   Final    HEMODIALYSIS FISTULA BOTTLES DRAWN AEROBIC AND ANAEROBIC   Special Requests Blood Culture adequate volume  Final   Culture   Final    NO GROWTH < 24 HOURS Performed at [facility], [address], [place], [place] [address]    Report Status PENDING  Incomplete  MRSA Next Gen by PCR, Nasal     Status: None   Collection Time: 09/21/21  9:13 PM   Specimen: Nasal Mucosa; Nasal Swab  Result Value Ref Range Status   MRSA by PCR Next Gen NOT DETECTED NOT DETECTED Final    Comment: (NOTE) The GeneXpert MRSA Assay (FDA approved for NASAL specimens only), is one component of a comprehensive MRSA colonization surveillance program. It is not intended to diagnose MRSA infection nor to guide or monitor treatment for MRSA infections. Test performance is not FDA approved in patients less than 65 years old. Performed at [facility], [address] [place],  [address]          Radiology Studies: DG CHEST PORT 1 VIEW  Result Date: 09/21/2021 CLINICAL DATA:  Pain and possible sepsis, initial encounter EXAM: PORTABLE CHEST 1 VIEW COMPARISON:  09/17/2021 FINDINGS: Cardiac shadow is enlarged but stable. Aortic calcifications are again seen. Central vascular congestion is noted similar to that seen on the prior exam with interstitial edema. The overall appearance is relatively stable from the prior study.  No bony abnormality is noted. IMPRESSION: Changes of  CHF. Electronically Signed   By: Inez Catalina M.D.   On: 09/21/2021 19:32        Scheduled Meds:  amiodarone  200 mg Oral Daily   apixaban  5 mg Oral BID   calcium acetate  1,334 mg Oral TID with meals   Chlorhexidine Gluconate Cloth  6 each Topical Q0600   [START ON 09/23/2021] Chlorhexidine Gluconate Cloth  6 each Topical Q0600   darbepoetin (ARANESP) injection - DIALYSIS  100 mcg Intravenous Q Fri-HD   feeding supplement  237 mL Oral BID BM   influenza vac split quadrivalent PF  0.5 mL Intramuscular Tomorrow-1000   lanthanum  500 mg Oral TID WC   midodrine  15 mg Oral TID WC   multivitamin  1 tablet Oral QHS   pantoprazole  40 mg Oral BID   sodium chloride flush  10-40 mL Intracatheter Q12H   Continuous Infusions:  sodium chloride     sodium chloride     sodium chloride Stopped (09/21/21 1702)   albumin human Stopped (09/18/21 1912)   [START ON 09/23/2021] ceFEPime (MAXIPIME) IV     metronidazole 500 mg (09/22/21 1135)   norepinephrine (LEVOPHED) Adult infusion 6 mcg/min (09/21/21 1658)   [START ON 09/23/2021] vancomycin       LOS: 5 days    Critical care time: 65 minutes    Alyxis Grippi D Manuella Ghazi, DO [facility]  If 7PM-7AM, please contact night-coverage [email or web address] 09/22/2021, 2:22 PM

## 2021-09-22 NOTE — Progress Notes (Signed)
Subjective:   Looks like a new person this AM, alert and interacting with family-  had HD yesterday removed 3900-  still on a little levophed-  was started on vanc and cefepime  Objective Vital signs in last 24 hours: Vitals:   09/22/21 0900 09/22/21 1000 09/22/21 1100 09/22/21 1110  BP: (!) 116/94  102/63   Pulse:      Resp:  _0 Temp:    97.6 F (36.4 C)  TempSrc:    Oral  SpO2:      Weight:      Height:       Weight change: 0 kg  Intake/Output Summary (Last 24 hours) at 09/22/2021 1130 Last data filed at 09/21/2021 1535 Gross per 24 hour  Intake --  Output 3926 ml  Net -3926 ml    Dialyzes at [facility]  MWF-  has not been there since last Friday  EDW 122.5/  4 hours and 15 minutes/AVF-  15 gauge/1K bath/2.5 calc/BFR 300. Calcitriol 0.5 q treatment -  no ESA-  on last Friday left out at 121.8-  heparin 1000 bolus and q hour    Assessment/Plan: 55 year old BF with ESRD, many other problems including lymphedema and chronic thigh wound who presents to hospital with increased thigh wound pain/nausea and missed HD  1 thigh wound-  per primary-  local wound care planned -  became more hemodynamically unstable yesterday so started on vanc and cefepime-  cultures taken-  neg to date but responded well clinically  2 ESRD: normally MWF via AVF-  had not had treatment since 9/23.  Received HD on 9/30  and on 10/3-   Next treatment planned for tomorrow 3 Hypertension: is overloaded but hypotensive -  midodrine and albumin with UF as able.  PLEASE do not bolus with NS unless in distress as is overloaded demonstrated by low sodium 4. Anemia of ESRD: not on ESA as OP?  Hgb 9-  have added 100 q Friday-  hgb dropping 5. Metabolic Bone Disease: continue calcitriol 0.5 TIW-  phos is good on fosrenol 6. Hyperkalemia -  seems to be a chronic issue-  on 1 K bath as OP-  given lokelma and bicarb but went up-  1 k bath Friday in HD -  has maintained over weekend likely has a lower K diet here-   2 K diet for now  [place]: Basic Metabolic Panel: Recent Labs  Lab 09/19/21 0813 09/19/21 1006 09/20/21 0511 09/21/21 0652  NA 132* 132* 130* 130*  K 6.6* 4.9 4.3 4.4  CL 91* 91* 92* 94*  CO2 14* 14* 22 19*  GLUCOSE <20* 93 180* 209*  BUN 61* 62* 71* 84*  CREATININE 6.19* 6.04* 6.43* 7.24*  CALCIUM 8.5* 8.4* 8.2* 8.2*  PHOS 8.0*  --  5.8* 5.3*   Liver Function Tests: Recent Labs  Lab 09/17/21 1325 09/18/21 0620 09/19/21 0813 09/20/21 0511 09/21/21 0652  AST 15 15  --   --   --   ALT 16 14  --   --   --   ALKPHOS 254* 223*  --   --   --   BILITOT 1.2 1.5*  --   --   --   PROT 7.8 7.3  --   --   --   ALBUMIN 3.3* 3.2* 3.3* 3.2* 2.8*   No results for input(s): LIPASE, AMYLASE in the last 168 hours. No results for input(s): AMMONIA  in the last 168 hours. CBC: Recent Labs  Lab 09/17/21 1325 09/18/21 0620 09/21/21 0652  WBC 10.4 9.9 29.1*  NEUTROABS 8.7*  --   --   HGB 10.0* 9.0* 8.4*  HCT 30.9* 26.8* 26.1*  MCV 92.5 91.2 94.2  PLT 295 298 296   Cardiac Enzymes: No results for input(s): CKTOTAL, CKMB, CKMBINDEX, TROPONINI in the last 168 hours. CBG: Recent Labs  Lab 09/19/21 0757 09/19/21 0835 09/19/21 1207 09/19/21 1735 09/20/21 2028  GLUCAP <10* 93 108* 245* 197*    Iron Studies: No results for input(s): IRON, TIBC, TRANSFERRIN, FERRITIN in the last 72 hours. Studies/Results: DG CHEST PORT 1 VIEW  Result Date: 09/21/2021 CLINICAL DATA:  Pain and possible sepsis, initial encounter EXAM: PORTABLE CHEST 1 VIEW COMPARISON:  09/17/2021 FINDINGS: Cardiac shadow is enlarged but stable. Aortic calcifications are again seen. Central vascular congestion is noted similar to that seen on the prior exam with interstitial edema. The overall appearance is relatively stable from the prior study. No bony abnormality is noted. IMPRESSION: Changes of CHF. Electronically Signed   By: Inez Catalina M.D.   On: 09/21/2021 19:32    Medications: Infusions:  sodium chloride     sodium chloride     sodium chloride Stopped (09/21/21 1702)   albumin human Stopped (09/18/21 1912)   [START ON 09/23/2021] ceFEPime (MAXIPIME) IV     metronidazole 500 mg (09/21/21 2321)   norepinephrine (LEVOPHED) Adult infusion 6 mcg/min (09/21/21 1658)   [START ON 09/23/2021] vancomycin      Scheduled Medications:  amiodarone  200 mg Oral Daily   apixaban  5 mg Oral BID   calcium acetate  1,334 mg Oral TID with meals   Chlorhexidine Gluconate Cloth  6 each Topical Q0600   darbepoetin (ARANESP) injection - DIALYSIS  100 mcg Intravenous Q Fri-HD   feeding supplement  237 mL Oral BID BM   influenza vac split quadrivalent PF  0.5 mL Intramuscular Tomorrow-1000   lanthanum  500 mg Oral TID WC   midodrine  15 mg Oral TID WC   multivitamin  1 tablet Oral QHS   pantoprazole  40 mg Oral BID   sodium chloride flush  10-40 mL Intracatheter Q12H    have reviewed scheduled and prn medications.  Physical Exam: General:  more alert and interactive  Heart: tachy Lungs: decreased effort Abdomen:  obese, soft, non tender Extremities: pitting edema-  left thigh bandaged Dialysis Access: AVF-  good thrill and bruit     09/22/2021,11:30 AM  LOS: 5 days

## 2021-09-23 DIAGNOSIS — I132 Hypertensive heart and chronic kidney disease with heart failure and with stage 5 chronic kidney disease, or end stage renal disease: Secondary | ICD-10-CM

## 2021-09-23 DIAGNOSIS — Z0279 Encounter for issue of other medical certificate: Secondary | ICD-10-CM

## 2021-09-23 DIAGNOSIS — E1122 Type 2 diabetes mellitus with diabetic chronic kidney disease: Secondary | ICD-10-CM

## 2021-09-23 DIAGNOSIS — I272 Pulmonary hypertension, unspecified: Secondary | ICD-10-CM

## 2021-09-23 DIAGNOSIS — S71102D Unspecified open wound, left thigh, subsequent encounter: Secondary | ICD-10-CM | POA: Diagnosis not present

## 2021-09-23 DIAGNOSIS — L89892 Pressure ulcer of other site, stage 2: Secondary | ICD-10-CM | POA: Diagnosis not present

## 2021-09-23 DIAGNOSIS — L03116 Cellulitis of left lower limb: Secondary | ICD-10-CM | POA: Diagnosis not present

## 2021-09-23 DIAGNOSIS — E1165 Type 2 diabetes mellitus with hyperglycemia: Secondary | ICD-10-CM | POA: Diagnosis not present

## 2021-09-23 DIAGNOSIS — D631 Anemia in chronic kidney disease: Secondary | ICD-10-CM

## 2021-09-23 DIAGNOSIS — I50813 Acute on chronic right heart failure: Secondary | ICD-10-CM

## 2021-09-23 DIAGNOSIS — N186 End stage renal disease: Secondary | ICD-10-CM

## 2021-09-23 DIAGNOSIS — I890 Lymphedema, not elsewhere classified: Secondary | ICD-10-CM | POA: Diagnosis not present

## 2021-09-23 LAB — CBC WITH DIFFERENTIAL/PLATELET
Abs Immature Granulocytes: 0.4 10*3/uL — ABNORMAL HIGH (ref 0.00–0.07)
Basophils Absolute: 0.1 10*3/uL (ref 0.0–0.1)
Basophils Relative: 0 %
Eosinophils Absolute: 0.1 10*3/uL (ref 0.0–0.5)
Eosinophils Relative: 0 %
HCT: 27.5 % — ABNORMAL LOW (ref 36.0–46.0)
Hemoglobin: 9 g/dL — ABNORMAL LOW (ref 12.0–15.0)
Immature Granulocytes: 2 %
Lymphocytes Relative: 9 %
Lymphs Abs: 1.8 10*3/uL (ref 0.7–4.0)
MCH: 29.8 pg (ref 26.0–34.0)
MCHC: 32.7 g/dL (ref 30.0–36.0)
MCV: 91.1 fL (ref 80.0–100.0)
Monocytes Absolute: 1.3 10*3/uL — ABNORMAL HIGH (ref 0.1–1.0)
Monocytes Relative: 6 %
Neutro Abs: 15.9 10*3/uL — ABNORMAL HIGH (ref 1.7–7.7)
Neutrophils Relative %: 83 %
Platelets: 278 10*3/uL (ref 150–400)
RBC: 3.02 MIL/uL — ABNORMAL LOW (ref 3.87–5.11)
RDW: 21.4 % — ABNORMAL HIGH (ref 11.5–15.5)
WBC: 19.4 10*3/uL — ABNORMAL HIGH (ref 4.0–10.5)
nRBC: 1.5 % — ABNORMAL HIGH (ref 0.0–0.2)

## 2021-09-23 LAB — RENAL FUNCTION PANEL
Albumin: 2.8 g/dL — ABNORMAL LOW (ref 3.5–5.0)
Anion gap: 15 (ref 5–15)
BUN: 56 mg/dL — ABNORMAL HIGH (ref 6–20)
CO2: 23 mmol/L (ref 22–32)
Calcium: 8.1 mg/dL — ABNORMAL LOW (ref 8.9–10.3)
Chloride: 91 mmol/L — ABNORMAL LOW (ref 98–111)
Creatinine, Ser: 5.55 mg/dL — ABNORMAL HIGH (ref 0.44–1.00)
GFR, Estimated: 9 mL/min — ABNORMAL LOW (ref >60)
Glucose, Bld: 273 mg/dL — ABNORMAL HIGH (ref 70–99)
Phosphorus: 3.9 mg/dL (ref 2.5–4.6)
Potassium: 3.8 mmol/L (ref 3.5–5.1)
Sodium: 129 mmol/L — ABNORMAL LOW (ref 135–145)

## 2021-09-23 LAB — PROCALCITONIN: Procalcitonin: 13.47 ng/mL

## 2021-09-23 LAB — GLUCOSE, CAPILLARY: Glucose-Capillary: 247 mg/dL — ABNORMAL HIGH (ref 70–99)

## 2021-09-23 MED ORDER — HYDROCORTISONE SOD SUC (PF) 100 MG IJ SOLR
100.0000 mg | Freq: Three times a day (TID) | INTRAMUSCULAR | Status: DC
  Administered 2021-09-23 – 2021-10-03 (×32): 100 mg via INTRAVENOUS
  Filled 2021-09-23 (×32): qty 2

## 2021-09-23 MED ORDER — CALCITRIOL 0.5 MCG PO CAPS
0.5000 ug | ORAL_CAPSULE | ORAL | Status: DC
  Administered 2021-09-23 – 2021-10-02 (×5): 0.5 ug via ORAL
  Filled 2021-09-23 (×3): qty 2
  Filled 2021-09-23: qty 1
  Filled 2021-09-23: qty 2

## 2021-09-23 NOTE — Procedures (Signed)
HEMODIALYSIS TREATMENT NOTE:   4 hour heparin-free session completed via left forearm AVF (15g/antegrade). Pulse 90s with occasional brief episodes of tachycardia up to 140.  HR consistently 115-130 for 15 minutes during last hour of session.  Goal and blood flow rate were lowered and HR normalized 90-110.  Vancomycin and Cefepime were given at the end of HD.  All blood was returned and hemostasis was achieved in 15 minutes.  No changes from pre-HD assessment.  Total run time: 4 hours Net UF 2.2 liters.   Rockwell Alexandria, RN

## 2021-09-23 NOTE — Progress Notes (Signed)
PT Cancellation Note  Patient Details Name: [name] MRN: [id number] DOB: [birth year]   Cancelled Treatment:    Reason Eval/Treat Not Completed: Medical issues which prohibited therapy.  Patient transferred to a higher level of care and will need new PT consult resume therapy when patient is medically stable.  Thank you.   8:31 AM, 09/23/21 Lonell Grandchild, MPT Physical Therapist with [facility] [phone] office [phone] mobile phone

## 2021-09-23 NOTE — Evaluation (Signed)
Clinical/Bedside Swallow Evaluation Patient Details  Name: [name] MRN: [id number] Date of Birth: 04-07-1966  Today's Date: 09/23/2021 Time: SLP Start Time (ACUTE ONLY): 51 SLP Stop Time (ACUTE ONLY): 6270 SLP Time Calculation (min) (ACUTE ONLY): 16 min  Past Medical History:  Past Medical History:  Diagnosis Date   Allergy    Phreesia 11/04/2020   Anemia, unspecified    Anxiety    Arthritis    Phreesia 11/04/2020   Asthma    as a child   Cancer ([place])    thyroid   Cellulitis    CHF (congestive heart failure) ([place])    Chiari malformation    s/p surgery   Chiari malformation    Chronic kidney disease    Phreesia 11/04/2020   Chronic kidney disease, stage 5 ([place])    Chronic obstructive pulmonary disease, unspecified ([place])    Chronic pain    Complication of anesthesia 11/28/15   Resp arrest after  conscious  sedation   Compression of brain (HCC)    Constipation    COPD (chronic obstructive pulmonary disease) ([place])    Phreesia 11/04/2020   Coronary artery disease    40-50% mid LAD 04/29/09, Medical tx. (Dr. Gwenlyn Found)   Diabetes mellitus    Type II- reports being off all medication d/t it being controlled   Diabetes mellitus without complication ([place])    Phreesia 11/04/2020   Essential (primary) hypertension    Fibromyalgia    Fibromyalgia    History of blood transfusion    hemorrage duinrg pregancy   Hx of echocardiogram 10/2011   EF 55-60%   Hypercholesterolemia    Hyperlipidemia, unspecified    Hypertension    Hypertensive chronic kidney disease with stage 1 through stage 4 chronic kidney disease, or unspecified chronic kidney disease    Hypertensive chronic kidney disease with stage 5 chronic kidney disease or end stage renal disease (HCC)    Lymph edema    Lymphedema, not elsewhere classified    Morbid (severe) obesity due to excess calories ([place])    Obesity hypoventilation syndrome ([employer])    On home oxygen therapy    "2L; 24/7" (12/21/2018)    Peritonitis, unspecified ([place])    Pneumonia    in past   Pulmonary hypertension ([place])    Renal disorder    M/W/F [facility] Pt started dialysis in Dec.2016   S/P colonoscopy 05/26/2007   Dr. Laural Golden sigmoid diverticulosis random biopsies benign   S/P endoscopy 05/01/2009   Dr. Penelope Coop pill-induced esophageal ulcerations distal to midesophagus, 2 small ulcers in the antrum of the stomach   Shortness of breath dyspnea    with any exertion or if heart rate  is irregular while on dialysis   Sleep apnea    reports that she no longer needs CPAP due to weight loss   Sleep apnea, unspecified    Type 2 diabetes mellitus with diabetic nephropathy ([place])    Type 2 diabetes mellitus with hyperglycemia ([place])    Past Surgical History:  Past Surgical History:  Procedure Laterality Date   .Hemodialysis catheter Right 11/28/2015   A/V FISTULAGRAM N/A 07/26/2017   Procedure: A/V Fistulagram - Left Arm;  Surgeon: Serafina Mitchell, MD;  Location: [facility];  Service: Cardiovascular;  Laterality: N/A;   ABDOMINAL HYSTERECTOMY     total hysterectomy   ADENOIDECTOMY     APPLICATION OF WOUND VAC Left 08/19/2021   Procedure: APPLICATION OF WOUND VAC;  Surgeon: Wallace Going, DO;  Location: [facility];  Service: Clinical cytogeneticist;  Laterality: Left;   AV FISTULA PLACEMENT Left 03/15/2016   Procedure: CREATION OF LEFT ARM ARTERIOVENOUS (AV) FISTULA  ;  Surgeon: Rosetta Posner, MD;  Location: [facility];  Service: Vascular;  Laterality: Left;   CAPD INSERTION N/A 08/30/2017   Procedure: LAPAROSCOPIC INSERTION CONTINUOUS AMBULATORY PERITONEAL DIALYSIS  (CAPD) CATHETER;  Surgeon: Clovis Riley, MD;  Location: [facility];  Service: General;  Laterality: N/A;   CARDIOVERSION N/A 07/03/2021   Procedure: CARDIOVERSION;  Surgeon: Larey Dresser, MD;  Location: [facility];  Service: Cardiovascular;  Laterality: N/A;   CESAREAN SECTION      x 2   CESAREAN SECTION N/A    Phreesia 11/04/2020   CRANIECTOMY SUBOCCIPITAL W/  CERVICAL LAMINECTOMY / CHIARI     FISTULA SUPERFICIALIZATION Left 05/10/2016   Procedure: Left Arm FISTULA SUPERFICIALIZATION;  Surgeon: Rosetta Posner, MD;  Location: [facility];  Service: Vascular;  Laterality: Left;   I & D EXTREMITY Left 08/19/2021   Procedure: Debridement of left thigh wound;  Surgeon: Wallace Going, DO;  Location: [facility];  Service: Plastics;  Laterality: Left;   IR GENERIC HISTORICAL  08/14/2016   IR REMOVAL TUN ACCESS W/ PORT W/O FL MOD SED 08/14/2016 Arne Cleveland, MD [facility]   IR SINUS/FIST TUBE CHK-NON GI  01/01/2019   IR THROMBECTOMY AV FISTULA W/THROMBOLYSIS INC/SHUNT/IMG LEFT Left 08/13/2021   IR US GUIDE BX ASP/DRAIN  12/28/2018   IR US GUIDE VASC ACCESS LEFT  08/13/2021   MINOR REMOVAL OF PERITONEAL DIALYSIS CATHETER N/A 04/26/2019   Procedure: REMOVAL OF INFECTED PERITONEAL DIALYSIS CATHETER;  Surgeon: Erroll Luna, MD;  Location: [facility];  Service: General;  Laterality: N/A;   PERIPHERAL VASCULAR BALLOON ANGIOPLASTY Left 10/18/2019   Procedure: PERIPHERAL VASCULAR BALLOON ANGIOPLASTY;  Surgeon: Marty Heck, MD;  Location: [facility];  Service: Cardiovascular;  Laterality: Left;  arm fistula   PERIPHERAL VASCULAR CATHETERIZATION Left 11/25/2016   Procedure: A/V Fistulagram;  Surgeon: Conrad Jasper, MD;  Location: [facility];  Service: Cardiovascular;  Laterality: Left;  arm   PORTACATH PLACEMENT  07/05/2012   Procedure: INSERTION PORT-A-CATH;  Surgeon: Donato Heinz, MD;  Location: [facility];  Service: General;  Laterality: Left;  subclavian   portacath removal     RIGHT HEART CATH N/A 08/04/2017   Procedure: RIGHT HEART CATH;  Surgeon: Jolaine Artist, MD;  Location: [facility];  Service: Cardiovascular;  Laterality: N/A;   RIGHT HEART CATH N/A 07/02/2021   Procedure: RIGHT HEART CATH;  Surgeon: Larey Dresser, MD;  Location: [facility];  Service: Cardiovascular;  Laterality: N/A;   TEE WITHOUT CARDIOVERSION N/A 07/03/2021    Procedure: TRANSESOPHAGEAL ECHOCARDIOGRAM (TEE);  Surgeon: Larey Dresser, MD;  Location: [facility] ENDOSCOPY;  Service: Cardiovascular;  Laterality: N/A;   THYROIDECTOMY, PARTIAL     TONSILLECTOMY     HPI:  [name] is a 55 year old female with past medical history significant for essential hypertension, ESRD on HD (MWF), chronic diastolic congestive heart failure, COPD, type 2 diabetes mellitus, chronic lymphedema, morbid obesity who presents to [facility] ED on 9/29 with worsening left thigh infection, generalized weakness/fatigue in the setting of missed dialysis sessions.  Patient been complaining of worsening pain and purulent foul-smelling discharge from her wound site.  She is recommended by her surgeons to have ongoing wound care, but developed septic shock likely related to left thigh wound with associated acute metabolic encephalopathy  on 10/3 for which she currently remains on norepinephrine as well as multiple antibiotics.    Assessment / Plan / Recommendation  Clinical Impression  Clinical swallowing evaluation completed while Pt was sitting upright in bed; Pt denies swallowing difficulty and reports she "just tried to swallow too many pills at once" and got "choked". Pt consumed thin liquids, puree and regular without overt s/sx of oropharyngeal dysphagia. SLP reviewed universal aspiration precautions with her and despite edentulous status reports no difficulty with mastication (this was evidenced by thorough mastication of regular - crackers). Recommend continue with regular diet and thin liquids. Recommend meds whole with liquids - ONE AT A TIME. There are no further ST needs noted at this time; ST will sign off. Thank you, SLP Visit Diagnosis: Dysphagia, unspecified (R13.10)    Aspiration Risk  No limitations    Diet Recommendation Regular;Thin liquid   Liquid Administration via: Cup;Straw Medication Administration: Whole meds with liquid (one at a time) Supervision:  Patient able to self feed Compensations: Slow rate;Small sips/bites Postural Changes: Seated upright at 90 degrees    Other  Recommendations Oral Care Recommendations: Oral care BID    Recommendations for follow up therapy are one component of a multi-disciplinary discharge planning process, led by the attending physician.  Recommendations may be updated based on patient status, additional functional criteria and insurance authorization.            Prognosis Prognosis for Safe Diet Advancement: Good      Swallow Study   General HPI: [name] is a 55 year old female with past medical history significant for essential hypertension, ESRD on HD (MWF), chronic diastolic congestive heart failure, COPD, type 2 diabetes mellitus, chronic lymphedema, morbid obesity who presents to [facility] ED on 9/29 with worsening left thigh infection, generalized weakness/fatigue in the setting of missed dialysis sessions.  Patient been complaining of worsening pain and purulent foul-smelling discharge from her wound site.  She is recommended by her surgeons to have ongoing wound care, but developed septic shock likely related to left thigh wound with associated acute metabolic encephalopathy on 10/3 for which she currently remains on norepinephrine as well as multiple antibiotics. Type of Study: Bedside Swallow Evaluation Previous Swallow Assessment: BSE 7/22 - no overt oropharyngeal signs ? esophageal component Diet Prior to this Study: Regular;Thin liquids Temperature Spikes Noted: No Respiratory Status: Room air History of Recent Intubation: No Behavior/Cognition: Alert;Cooperative;Pleasant mood Oral Cavity Assessment: Within Functional Limits Oral Care Completed by SLP: Recent completion by staff Oral Cavity - Dentition: Edentulous (dentures not present) Vision: Functional for self-feeding Self-Feeding Abilities: Able to feed self;Needs set up Baseline Vocal Quality: Normal Volitional Cough:  Strong Volitional Swallow: Able to elicit    Oral/Motor/Sensory Function Overall Oral Motor/Sensory Function: Within functional limits   Ice Chips Ice chips: Within functional limits   Thin Liquid Thin Liquid: Within functional limits    Nectar Thick Nectar Thick Liquid: Not tested   Honey Thick Honey Thick Liquid: Not tested   Puree Puree: Within functional limits   Solid     Solid: Within functional limits     Dayanna Pryce H. Roddie Mc, CCC-SLP Speech Language Pathologist  Wende Bushy 09/23/2021,1:45 PM

## 2021-09-23 NOTE — Plan of Care (Signed)

## 2021-09-23 NOTE — Progress Notes (Addendum)
Nephrology Progress Note  Subjective:     Last HD on 10/3 with 3.9 kg UF.  Mental status was much improved on yesterday's assessment per charting. She has been on levo at 6 mcg/min and per nursing hasn't been able to wean.  She states she does feel like she has a little fluid on but weights are inaccurate.  She is on fosrenol and phoslo at home  Review of systems:  Denies shortness of breath or chest pain  Some dizziness  Reports nausea   Objective Vital signs in last 24 hours: Vitals:   09/23/21 0400 09/23/21 0500 09/23/21 0600 09/23/21 0700  BP: (!) 129/56 121/64 124/72 118/76  Pulse:      Resp: (!) 21 (!) _0 Temp: 98.9 F (37.2 C)     TempSrc: Oral     SpO2:      Weight:  129.1 kg    Height:       Weight change: -1.9 kg  Intake/Output Summary (Last 24 hours) at 09/23/2021 0842 Last data filed at 09/23/2021 0759 Gross per 24 hour  Intake 1459.67 ml  Output --  Net 1459.67 ml    Dialyzes at [facility]  MWF-  has not been there since last Friday  EDW 122.5/  4 hours and 15 minutes/AVF-  15 gauge/1K bath/2.5 calc/BFR 300. Calcitriol 0.5 q treatment -  no ESA-  on last Friday left out at 121.8-  heparin 1000 bolus and q hour    Assessment/Plan: 55 year old BF with ESRD, many other problems including lymphedema and chronic thigh wound who presents to hospital with increased thigh wound pain/nausea and missed HD   1 Left thigh wound-  wound care per primary.  Abx per primary team - currently on vanc and cefepime.     2 ESRD: normally MWF via AVF-  prior to admission had not had treatment since 9/23.   - Continue HD per MWF schedule   3 Chronic hypotension: is overloaded but hypotensive -  continue midodrine with UF as able - decrease UF goal for today to 2.5 kg as tolerated - spoke with HD RN.  HD with aid of albumin.  PLEASE do not bolus with NS unless in distress as is overloaded demonstrated by low sodium  4. Anemia of ESRD: not on ESA as OP?  on aranesp  100 mcg weekly on Fridays here - follow trends and reassess dosing  5. Metabolic Bone Disease: continue calcitriol 0.5 three times a week with HD (do not include on discharge med rec - is given at HD). phos controlled on phoslo and fosrenol  6. Hyperkalemia -  seems to be a chronic issue-  on 1 K bath as OP-  given lokelma and bicarb but went up-  1 k bath Friday in HD -  has maintained over weekend likely has a lower K diet here-  2 K diet for now.  She was given K supplement on 10/4 - please do not supplement K unless below 3.   7. pAfib - agents per primary team - on amio   Labs: Basic Metabolic Panel: Recent Labs  Lab 09/21/21 0652 09/22/21 1227 09/23/21 0525  NA 130* 130* 129*  K 4.4 3.2* 3.8  CL 94* 91* 91*  CO2 19* 24 23  GLUCOSE 209* 193* 273*  BUN 84* 49* 56*  CREATININE 7.24* 5.11* 5.55*  CALCIUM 8.2* 8.3* 8.1*  PHOS 5.3* 4.2 3.9   Liver Function Tests: Recent Labs  Lab  09/17/21 1325 09/18/21 0620 09/19/21 0813 09/21/21 0652 09/22/21 1227 09/23/21 0525  AST 15 15  --   --   --   --   ALT 16 14  --   --   --   --   ALKPHOS 254* 223*  --   --   --   --   BILITOT 1.2 1.5*  --   --   --   --   PROT 7.8 7.3  --   --   --   --   ALBUMIN 3.3* 3.2*   < > 2.8* 3.0* 2.8*   < > = values in this interval not displayed.    CBC: Recent Labs  Lab 09/17/21 1325 09/18/21 0620 09/21/21 0652 09/22/21 1227 09/23/21 0525  WBC 10.4 9.9 29.1* 24.3* 19.4*  NEUTROABS 8.7*  --   --  21.2* 15.9*  HGB 10.0* 9.0* 8.4* 9.3* 9.0*  HCT 30.9* 26.8* 26.1* 28.1* 27.5*  MCV 92.5 91.2 94.2 92.1 91.1  PLT 295 298 296 297 278    CBG: Recent Labs  Lab 09/19/21 0835 09/19/21 1207 09/19/21 1735 09/20/21 2028 09/23/21 0011  GLUCAP 93 108* 245* 197* 247*    Iron Studies: No results for input(s): IRON, TIBC, TRANSFERRIN, FERRITIN in the last 72 hours. Studies/Results: CT FEMUR LEFT WO CONTRAST  Result Date: 09/22/2021 CLINICAL DATA:  Chronic left thigh wound.  Prior  debridement. EXAM: CT OF THE LOWER LEFT EXTREMITY WITHOUT CONTRAST TECHNIQUE: Multidetector CT imaging of the lower left extremity was performed according to the standard protocol. COMPARISON:  Left femur x-rays dated September 17, 2021. CT left femur dated July 15, 2021. FINDINGS: Bones/Joint/Cartilage No bony destruction or periosteal reaction. In no fracture or dislocation. Left hip and knee degenerative changes. Chronic trace left knee joint effusion. Ligaments Ligaments are suboptimally evaluated by CT. Muscles and Tendons Grossly intact. Soft tissue Soft tissue wound along the anteromedial distal thigh with surrounding skin thickening and underlying subcutaneous edema. Prominent subcutaneous edema in the lateral thigh as well. Numerous scattered soft tissue calcifications again noted. No fluid collection or subcutaneous emphysema. No soft tissue mass. Reactive left inguinal lymph nodes. IMPRESSION: 1. Soft tissue wound along the anteromedial distal thigh with surrounding skin thickening and underlying subcutaneous edema. No fluid collection or subcutaneous emphysema. 2. No acute osseous abnormality. Electronically Signed   By: Titus Dubin M.D.   On: 09/22/2021 16:05   DG CHEST PORT 1 VIEW  Result Date: 09/21/2021 CLINICAL DATA:  Pain and possible sepsis, initial encounter EXAM: PORTABLE CHEST 1 VIEW COMPARISON:  09/17/2021 FINDINGS: Cardiac shadow is enlarged but stable. Aortic calcifications are again seen. Central vascular congestion is noted similar to that seen on the prior exam with interstitial edema. The overall appearance is relatively stable from the prior study. No bony abnormality is noted. IMPRESSION: Changes of CHF. Electronically Signed   By: Inez Catalina M.D.   On: 09/21/2021 19:32   Medications: Infusions:  sodium chloride     sodium chloride     sodium chloride Stopped (09/21/21 1702)   albumin human Stopped (09/18/21 1912)   ceFEPime (MAXIPIME) IV     metronidazole 10 mL/hr at  09/23/21 0759   norepinephrine (LEVOPHED) Adult infusion 7 mcg/min (09/23/21 0759)   vancomycin      Scheduled Medications:  amiodarone  200 mg Oral Daily   apixaban  5 mg Oral BID   calcium acetate  1,334 mg Oral TID with meals   Chlorhexidine Gluconate Cloth  6 each  Topical Q0600   darbepoetin (ARANESP) injection - DIALYSIS  100 mcg Intravenous Q Fri-HD   feeding supplement  237 mL Oral BID BM   influenza vac split quadrivalent PF  0.5 mL Intramuscular Tomorrow-1000   lanthanum  500 mg Oral TID WC   midodrine  15 mg Oral TID WC   multivitamin  1 tablet Oral QHS   pantoprazole  40 mg Oral BID   sodium chloride flush  10-40 mL Intracatheter Q12H    have reviewed scheduled and prn medications.  Physical Exam:  General adult female in bed chronically ill appearing  HEENT normocephalic atraumatic extraocular movements intact sclera anicteric Neck supple trachea midline Lungs clear to auscultation bilaterally normal work of breathing at rest  Heart tachycardic from 113's to 120's at rest no rub appreciated Abdomen soft nontender nondistended; obese habitus Extremities chronic lymphedema Psych normal mood and affect Neuro alert and oriented x 3 follows commands and conversant LUE AVF bruit and thrill    Claudia Desanctis, MD  09/23/2021,8:42 AM  LOS: 6 days

## 2021-09-23 NOTE — Progress Notes (Signed)
Inpatient Diabetes Program Recommendations  AACE/ADA: New Consensus Statement on Inpatient Glycemic Control (2015)  Target Ranges:  Prepandial:   less than 140 mg/dL      Peak postprandial:   less than 180 mg/dL (1-2 hours)      Critically ill patients:  140 - 180 mg/dL   Lab Results  Component Value Date   GLUCAP 247 (H) 09/23/2021   HGBA1C 6.8 (H) 09/18/2021    Review of Glycemic Control Results for BREALYNN, CONTINO (MRN [id number]) as of 09/23/2021 09:25  Ref. Range 09/20/2021 20:28 09/23/2021 00:11  Glucose-Capillary Latest Ref Range: 70 - 99 mg/dL 197 (H) 247 (H)   Diabetes history: Diet controlled DM2 Current orders for Inpatient glycemic control: None  Inpatient Diabetes Program Recommendations:    May consider adding Novolog very sensitive (0-6 units) tid with meals if blood sugars remain>180 mg/dL.   Thanks,  Adah Perl, RN, BC-ADM Inpatient Diabetes Coordinator Pager [phone]  (8a-5p)

## 2021-09-23 NOTE — Progress Notes (Signed)
PROGRESS NOTE    [name]  ZOX:[id number] DOB: 1966/07/28 DOA: 09/17/2021 PCP: Noreene Larsson, NP   Brief Narrative:   [name] is a 55 year old female with past medical history significant for essential hypertension, ESRD on HD (MWF), chronic diastolic congestive heart failure, COPD, type 2 diabetes mellitus, chronic lymphedema, morbid obesity who presents to [facility], ED on 9/29 with worsening left thigh infection, generalized weakness/fatigue in the setting of missed dialysis sessions.  Patient been complaining of worsening pain and purulent foul-smelling discharge from her wound site.  She is recommended by her surgeons to have ongoing wound care, but developed septic shock likely related to left thigh wound with associated acute metabolic encephalopathy on 10/3 for which she currently remains on norepinephrine as well as multiple antibiotics.  Assessment & Plan:   Principal Problem:   Open wound of left thigh Active Problems:   Lymphedema of lower extremity   Diabetes mellitus (HCC)   Hyperkalemia   HTN (hypertension)   HLD (hyperlipidemia)   ESRD on dialysis (HCC)   Lymphedema   Obesity, Class III, BMI 40-49.9 (morbid obesity) (HCC)   Severe sepsis with septic shock (HCC)   Hyponatremia   Hypoalbuminemia due to protein-calorie malnutrition (HCC)   Elevated brain natriuretic peptide (BNP) level   Atrial fibrillation, chronic (HCC)   Pulmonary edema   Prolonged QT interval   Elevated alkaline phosphatase in newborn   Septic shock secondary to left thigh wound, not POA -CT left femur pending with possible need to transfer as needed -Continue IV antibiotics as ordered -Continues to require norepinephrine, appreciate general surgery with placement of right femoral line -Procalcitonin rising -Addition of hydrocortisone IV 10/5 to see if this will assist with weaning norepinephrine   Acute metabolic encephalopathy secondary to above -Appears to be  resolved -Continue to maintain MAP greater than 60   Acute on chronic diastolic congestive heart failure -Echocardiogram with LVEF 60-65% and grade 1 diastolic dysfunction on 07/20/1913 -Volume management per hemodialysis   ESRD on HD MWF -Nephrology following with hemodialysis planned for today   Hyponatremia -Per nephrology with hemodialysis   Paroxysmal atrial fibrillation -Continues on apixaban -Amiodarone 200 mg p.o. daily   History of chronic hypotension -Continues on Levophed as above, wean as tolerated -Continues home midodrine 15 mg p.o. 3 times daily   Type 2 diabetes Diet controlled at home with recent hemoglobin A1c 6.9%   Morbid obesity -Lifestyle changes outpatient   Weakness/fatigue/deconditioning -PT/OT recommending SNF, but refuses   DVT prophylaxis: Eliquis Code Status: Full Family Communication: Discussed with husband on phone 10/5 Disposition Plan:  Status is: Inpatient   Remains inpatient appropriate because:Hemodynamically unstable, IV treatments appropriate due to intensity of illness or inability to take PO, and Inpatient level of care appropriate due to severity of illness   Dispo: The patient is from: Home              Anticipated d/c is to: Home              Patient currently is not medically stable to d/c.              Difficult to place patient No   Nutritional Assessment:   The patient's BMI is: Body mass index is 46.7 kg/m.Marland Kitchen   Seen by dietician.  I agree with the assessment and plan as outlined below:   Nutrition Status:   Skin Assessment:   I have examined the patient's skin and I agree with the wound  assessment as performed by the wound care RN as outlined below:   Pressure Injury 07/04/21 Thigh Posterior;Proximal;Right Stage 2 -  Partial thickness loss of dermis presenting as a shallow open injury with a red, pink wound bed without slough. (Active)  07/04/21 1200  Location: Thigh (left thigh)  Location Orientation:  Posterior;Proximal;Right  Staging: Stage 2 -  Partial thickness loss of dermis presenting as a shallow open injury with a red, pink wound bed without slough.  Wound Description (Comments):   Present on Admission: No      Consultants:  Nephrology, Dr. Moshe Cipro Vascular surgery and plastic surgery - discussed with EDP   Procedures:  Sutures to left thigh wound placed by EDP on admission   Antimicrobials:  Vancomycin 10/3>> Cefepime 10/3>> Metronidazole 10/3>>   Subjective: Patient seen and evaluated today with no new acute complaints or concerns. No acute concerns or events noted overnight.  She continues to require norepinephrine for blood pressure support.  Objective: Vitals:   09/23/21 0600 09/23/21 0700 09/23/21 0800 09/23/21 0845  BP: 124/72 118/76 126/72 (!) 83/53  Pulse:      Resp: _0 Temp:      TempSrc:      SpO2:      Weight:      Height:        Intake/Output Summary (Last 24 hours) at 09/23/2021 1022 Last data filed at 09/23/2021 0759 Gross per 24 hour  Intake 1459.67 ml  Output --  Net 1459.67 ml   Filed Weights   09/21/21 1601 09/22/21 0500 09/23/21 0500  Weight: 128.3 kg 127.3 kg 129.1 kg    Examination:  General exam: Appears calm and comfortable  Respiratory system: Clear to auscultation. Respiratory effort normal.  Currently on nasal cannula. Cardiovascular system: S1 & S2 heard, RRR.  Gastrointestinal system: Abdomen is soft Central nervous system: Alert and awake Extremities: No edema, left thigh wound clean dry and intact. Skin: No significant lesions noted Psychiatry: Flat affect.    Data Reviewed: I have personally reviewed following labs and imaging studies  CBC: Recent Labs  Lab 09/17/21 1325 09/18/21 0620 09/21/21 0652 09/22/21 1227 09/23/21 0525  WBC 10.4 9.9 29.1* 24.3* 19.4*  NEUTROABS 8.7*  --   --  21.2* 15.9*  HGB 10.0* 9.0* 8.4* 9.3* 9.0*  HCT 30.9* 26.8* 26.1* 28.1* 27.5*  MCV 92.5 91.2 94.2 92.1 91.1   PLT 295 298 296 297 382   Basic Metabolic Panel: Recent Labs  Lab 09/17/21 1325 09/17/21 1325 09/18/21 0620 09/19/21 0813 09/19/21 1006 09/20/21 0511 09/21/21 0652 09/22/21 1227 09/23/21 0525  NA 127*  --  130* 132* 132* 130* 130* 130* 129*  K 5.9*  --  6.3* 6.6* 4.9 4.3 4.4 3.2* 3.8  CL 88*  --  90* 91* 91* 92* 94* 91* 91*  CO2 21*  --  20* 14* 14* 22 19* 24 23  GLUCOSE 106*  --  79 <20* 93 180* 209* 193* 273*  BUN 105*  --  114* 61* 62* 71* 84* 49* 56*  CREATININE 8.88*  --  9.30* 6.19* 6.04* 6.43* 7.24* 5.11* 5.55*  CALCIUM 8.2*  --  8.1* 8.5* 8.4* 8.2* 8.2* 8.3* 8.1*  MG 2.2  --  2.2  --   --   --   --   --   --   PHOS  --    < > 7.4* 8.0*  --  5.8* 5.3* 4.2 3.9   < > = values in this  interval not displayed.   GFR: Estimated Creatinine Clearance: 15.5 mL/min (A) (by C-G formula based on SCr of 5.55 mg/dL (H)). Liver Function Tests: Recent Labs  Lab 09/17/21 1325 09/18/21 0620 09/19/21 0813 09/20/21 0511 09/21/21 0652 09/22/21 1227 09/23/21 0525  AST 15 15  --   --   --   --   --   ALT 16 14  --   --   --   --   --   ALKPHOS 254* 223*  --   --   --   --   --   BILITOT 1.2 1.5*  --   --   --   --   --   PROT 7.8 7.3  --   --   --   --   --   ALBUMIN 3.3* 3.2* 3.3* 3.2* 2.8* 3.0* 2.8*   No results for input(s): LIPASE, AMYLASE in the last 168 hours. No results for input(s): AMMONIA in the last 168 hours. Coagulation Profile: Recent Labs  Lab 09/18/21 0620 09/21/21 1124  INR 1.6* 4.7*   Cardiac Enzymes: No results for input(s): CKTOTAL, CKMB, CKMBINDEX, TROPONINI in the last 168 hours. BNP (last 3 results) No results for input(s): PROBNP in the last 8760 hours. HbA1C: No results for input(s): HGBA1C in the last 72 hours. CBG: Recent Labs  Lab 09/19/21 0835 09/19/21 1207 09/19/21 1735 09/20/21 2028 09/23/21 0011  GLUCAP 93 108* 245* 197* 247*   Lipid Profile: No results for input(s): CHOL, HDL, LDLCALC, TRIG, CHOLHDL, LDLDIRECT in the last 72  hours. Thyroid Function Tests: No results for input(s): TSH, T4TOTAL, FREET4, T3FREE, THYROIDAB in the last 72 hours. Anemia Panel: No results for input(s): VITAMINB12, FOLATE, FERRITIN, TIBC, IRON, RETICCTPCT in the last 72 hours. Sepsis Labs: Recent Labs  Lab 09/17/21 1325 09/17/21 1433 09/21/21 0652 09/21/21 1124 09/21/21 1258 09/22/21 1227 09/23/21 0525  PROCALCITON  --   --  11.31  --   --  14.88 13.47  LATICACIDVEN 1.3 1.3  --  2.8* 1.4  --   --     Recent Results (from the past 240 hour(s))  Blood culture (routine x 2)     Status: None   Collection Time: 09/17/21  1:25 PM   Specimen: Right Antecubital; Blood  Result Value Ref Range Status   Specimen Description RIGHT ANTECUBITAL  Final   Special Requests   Final    Blood Culture adequate volume BOTTLES DRAWN AEROBIC AND ANAEROBIC   Culture   Final    NO GROWTH 5 DAYS Performed at [facility], [address], [place], [place] [address]    Report Status 09/22/2021 FINAL  Final  Resp Panel by RT-PCR (Flu A&B, Covid) Nasopharyngeal Swab     Status: None   Collection Time: 09/17/21  1:27 PM   Specimen: Nasopharyngeal Swab; Nasopharyngeal(NP) swabs in vial transport medium  Result Value Ref Range Status   SARS Coronavirus 2 by RT PCR NEGATIVE NEGATIVE Final    Comment: (NOTE) SARS-CoV-2 target nucleic acids are NOT DETECTED.  The SARS-CoV-2 RNA is generally detectable in upper respiratory specimens during the acute phase of infection. The lowest concentration of SARS-CoV-2 viral copies this assay can detect is 138 copies/mL. A negative result does not preclude SARS-Cov-2 infection and should not be used as the sole basis for treatment or other patient management decisions. A negative result may occur with  improper specimen collection/handling, submission of specimen other than nasopharyngeal swab, presence of viral mutation(s) within the areas targeted  by this assay, and inadequate number of viral copies(<138  copies/mL). A negative result must be combined with clinical observations, patient history, and epidemiological information. The expected result is Negative.  Fact Sheet for Patients:  EntrepreneurPulse.com.au  Fact Sheet for Healthcare Providers:  IncredibleEmployment.be  This test is no t yet approved or cleared by the [place] FDA and  has been authorized for detection and/or diagnosis of SARS-CoV-2 by FDA under an Emergency Use Authorization (EUA). This EUA will remain  in effect (meaning this test can be used) for the duration of the COVID-19 declaration under Section 564(b)(1) of the Act, 21 U.S.C.section 360bbb-3(b)(1), unless the authorization is terminated  or revoked sooner.       Influenza A by PCR NEGATIVE NEGATIVE Final   Influenza B by PCR NEGATIVE NEGATIVE Final    Comment: (NOTE) The Xpert Xpress SARS-CoV-2/FLU/RSV plus assay is intended as an aid in the diagnosis of influenza from Nasopharyngeal swab specimens and should not be used as a sole basis for treatment. Nasal washings and aspirates are unacceptable for Xpert Xpress SARS-CoV-2/FLU/RSV testing.  Fact Sheet for Patients: EntrepreneurPulse.com.au  Fact Sheet for Healthcare Providers: IncredibleEmployment.be  This test is not yet approved or cleared by the [place] FDA and has been authorized for detection and/or diagnosis of SARS-CoV-2 by FDA under an Emergency Use Authorization (EUA). This EUA will remain in effect (meaning this test can be used) for the duration of the COVID-19 declaration under Section 564(b)(1) of the Act, 21 U.S.C. section 360bbb-3(b)(1), unless the authorization is terminated or revoked.  Performed at [facility], [address] [place], [place] [address]   Blood culture (routine x 2)     Status: None   Collection Time: 09/17/21  1:30 PM   Specimen: Right Antecubital; Blood  Result Value Ref  Range Status   Specimen Description RIGHT ANTECUBITAL  Final   Special Requests   Final    BOTTLES DRAWN AEROBIC AND ANAEROBIC Blood Culture adequate volume   Culture   Final    NO GROWTH 5 DAYS Performed at [facility], [address], [place], [place] [address]    Report Status 09/22/2021 FINAL  Final  Culture, blood (routine x 2)     Status: None (Preliminary result)   Collection Time: 09/21/21 11:24 AM   Specimen: Hemodialysis Fistula; Blood  Result Value Ref Range Status   Specimen Description   Final    HEMODIALYSIS FISTULA BOTTLES DRAWN AEROBIC AND ANAEROBIC   Special Requests Blood Culture adequate volume  Final   Culture   Final    NO GROWTH 2 DAYS Performed at [facility], [address], [place], [place] [address]    Report Status PENDING  Incomplete  Culture, blood (routine x 2)     Status: None (Preliminary result)   Collection Time: 09/21/21 11:24 AM   Specimen: Hemodialysis Fistula; Blood  Result Value Ref Range Status   Specimen Description   Final    HEMODIALYSIS FISTULA BOTTLES DRAWN AEROBIC AND ANAEROBIC   Special Requests Blood Culture adequate volume  Final   Culture   Final    NO GROWTH 2 DAYS Performed at [facility], [address] [place], [place] [address]    Report Status PENDING  Incomplete  MRSA Next Gen by PCR, Nasal     Status: None   Collection Time: 09/21/21  9:13 PM   Specimen: Nasal Mucosa; Nasal Swab  Result Value Ref Range Status   MRSA by PCR Next Gen NOT DETECTED  NOT DETECTED Final    Comment: (NOTE) The GeneXpert MRSA Assay (FDA approved for NASAL specimens only), is one component of a comprehensive MRSA colonization surveillance program. It is not intended to diagnose MRSA infection nor to guide or monitor treatment for MRSA infections. Test performance is not FDA approved in patients less than 59 years old. Performed at [facility], [address] [place],  [address]          Radiology Studies: CT FEMUR LEFT WO  CONTRAST  Result Date: 09/22/2021 CLINICAL DATA:  Chronic left thigh wound.  Prior debridement. EXAM: CT OF THE LOWER LEFT EXTREMITY WITHOUT CONTRAST TECHNIQUE: Multidetector CT imaging of the lower left extremity was performed according to the standard protocol. COMPARISON:  Left femur x-rays dated September 17, 2021. CT left femur dated July 15, 2021. FINDINGS: Bones/Joint/Cartilage No bony destruction or periosteal reaction. In no fracture or dislocation. Left hip and knee degenerative changes. Chronic trace left knee joint effusion. Ligaments Ligaments are suboptimally evaluated by CT. Muscles and Tendons Grossly intact. Soft tissue Soft tissue wound along the anteromedial distal thigh with surrounding skin thickening and underlying subcutaneous edema. Prominent subcutaneous edema in the lateral thigh as well. Numerous scattered soft tissue calcifications again noted. No fluid collection or subcutaneous emphysema. No soft tissue mass. Reactive left inguinal lymph nodes. IMPRESSION: 1. Soft tissue wound along the anteromedial distal thigh with surrounding skin thickening and underlying subcutaneous edema. No fluid collection or subcutaneous emphysema. 2. No acute osseous abnormality. Electronically Signed   By: Titus Dubin M.D.   On: 09/22/2021 16:05   DG CHEST PORT 1 VIEW  Result Date: 09/21/2021 CLINICAL DATA:  Pain and possible sepsis, initial encounter EXAM: PORTABLE CHEST 1 VIEW COMPARISON:  09/17/2021 FINDINGS: Cardiac shadow is enlarged but stable. Aortic calcifications are again seen. Central vascular congestion is noted similar to that seen on the prior exam with interstitial edema. The overall appearance is relatively stable from the prior study. No bony abnormality is noted. IMPRESSION: Changes of CHF. Electronically Signed   By: Inez Catalina M.D.   On: 09/21/2021 19:32        Scheduled Meds:  amiodarone  200 mg Oral Daily   apixaban  5 mg Oral BID   calcitRIOL  0.5 mcg Oral Q  M,W,F-HD   calcium acetate  1,334 mg Oral TID with meals   Chlorhexidine Gluconate Cloth  6 each Topical Q0600   darbepoetin (ARANESP) injection - DIALYSIS  100 mcg Intravenous Q Fri-HD   feeding supplement  237 mL Oral BID BM   hydrocortisone sod succinate (SOLU-CORTEF) inj  100 mg Intravenous Q8H   influenza vac split quadrivalent PF  0.5 mL Intramuscular Tomorrow-1000   lanthanum  500 mg Oral TID WC   midodrine  15 mg Oral TID WC   multivitamin  1 tablet Oral QHS   pantoprazole  40 mg Oral BID   sodium chloride flush  10-40 mL Intracatheter Q12H   Continuous Infusions:  sodium chloride     sodium chloride     sodium chloride Stopped (09/21/21 1702)   albumin human Stopped (09/18/21 1912)   ceFEPime (MAXIPIME) IV     metronidazole 10 mL/hr at 09/23/21 0759   norepinephrine (LEVOPHED) Adult infusion 7 mcg/min (09/23/21 0759)   vancomycin       LOS: 6 days    Time spent: 35 minutes    Miracle Mongillo Darleen Crocker, DO [facility]  If 7PM-7AM, please contact night-coverage [email or web address] 09/23/2021, 10:22 AM

## 2021-09-24 ENCOUNTER — Telehealth: Payer: Self-pay

## 2021-09-24 DIAGNOSIS — S71102D Unspecified open wound, left thigh, subsequent encounter: Secondary | ICD-10-CM | POA: Diagnosis not present

## 2021-09-24 LAB — CBC WITH DIFFERENTIAL/PLATELET
Abs Immature Granulocytes: 0.3 10*3/uL — ABNORMAL HIGH (ref 0.00–0.07)
Basophils Absolute: 0.1 10*3/uL (ref 0.0–0.1)
Basophils Relative: 0 %
Eosinophils Absolute: 0.2 10*3/uL (ref 0.0–0.5)
Eosinophils Relative: 1 %
HCT: 27.1 % — ABNORMAL LOW (ref 36.0–46.0)
Hemoglobin: 8.7 g/dL — ABNORMAL LOW (ref 12.0–15.0)
Immature Granulocytes: 2 %
Lymphocytes Relative: 4 %
Lymphs Abs: 0.8 10*3/uL (ref 0.7–4.0)
MCH: 29.5 pg (ref 26.0–34.0)
MCHC: 32.1 g/dL (ref 30.0–36.0)
MCV: 91.9 fL (ref 80.0–100.0)
Monocytes Absolute: 0.6 10*3/uL (ref 0.1–1.0)
Monocytes Relative: 3 %
Neutro Abs: 17.1 10*3/uL — ABNORMAL HIGH (ref 1.7–7.7)
Neutrophils Relative %: 90 %
Platelets: 245 10*3/uL (ref 150–400)
RBC: 2.95 MIL/uL — ABNORMAL LOW (ref 3.87–5.11)
RDW: 22.3 % — ABNORMAL HIGH (ref 11.5–15.5)
WBC: 19 10*3/uL — ABNORMAL HIGH (ref 4.0–10.5)
nRBC: 0.8 % — ABNORMAL HIGH (ref 0.0–0.2)

## 2021-09-24 LAB — RENAL FUNCTION PANEL
Albumin: 2.7 g/dL — ABNORMAL LOW (ref 3.5–5.0)
Anion gap: 14 (ref 5–15)
BUN: 34 mg/dL — ABNORMAL HIGH (ref 6–20)
CO2: 26 mmol/L (ref 22–32)
Calcium: 8.4 mg/dL — ABNORMAL LOW (ref 8.9–10.3)
Chloride: 91 mmol/L — ABNORMAL LOW (ref 98–111)
Creatinine, Ser: 3.65 mg/dL — ABNORMAL HIGH (ref 0.44–1.00)
GFR, Estimated: 14 mL/min — ABNORMAL LOW (ref >60)
Glucose, Bld: 274 mg/dL — ABNORMAL HIGH (ref 70–99)
Phosphorus: 4.3 mg/dL (ref 2.5–4.6)
Potassium: 3.5 mmol/L (ref 3.5–5.1)
Sodium: 131 mmol/L — ABNORMAL LOW (ref 135–145)

## 2021-09-24 LAB — GLUCOSE, CAPILLARY
Glucose-Capillary: 201 mg/dL — ABNORMAL HIGH (ref 70–99)
Glucose-Capillary: 235 mg/dL — ABNORMAL HIGH (ref 70–99)
Glucose-Capillary: 268 mg/dL — ABNORMAL HIGH (ref 70–99)
Glucose-Capillary: 278 mg/dL — ABNORMAL HIGH (ref 70–99)
Glucose-Capillary: 297 mg/dL — ABNORMAL HIGH (ref 70–99)

## 2021-09-24 LAB — MAGNESIUM: Magnesium: 1.9 mg/dL (ref 1.7–2.4)

## 2021-09-24 MED ORDER — MIDODRINE HCL 5 MG PO TABS
20.0000 mg | ORAL_TABLET | Freq: Three times a day (TID) | ORAL | Status: DC
  Administered 2021-09-24 – 2021-09-27 (×8): 20 mg via ORAL
  Filled 2021-09-24 (×8): qty 4

## 2021-09-24 MED ORDER — CHLORHEXIDINE GLUCONATE CLOTH 2 % EX PADS
6.0000 | MEDICATED_PAD | Freq: Every day | CUTANEOUS | Status: DC
  Administered 2021-09-24: 6 via TOPICAL

## 2021-09-24 MED ORDER — CHLORHEXIDINE GLUCONATE CLOTH 2 % EX PADS: 6.0000 | MEDICATED_PAD | Freq: Every day | CUTANEOUS | Status: DC

## 2021-09-24 MED ORDER — INSULIN ASPART 100 UNIT/ML IJ SOLN
0.0000 [IU] | INTRAMUSCULAR | Status: DC
  Administered 2021-09-24: 5 [IU] via SUBCUTANEOUS
  Administered 2021-09-24 (×2): 8 [IU] via SUBCUTANEOUS
  Administered 2021-09-24: 5 [IU] via SUBCUTANEOUS
  Administered 2021-09-24: 8 [IU] via SUBCUTANEOUS
  Administered 2021-09-25: 2 [IU] via SUBCUTANEOUS
  Administered 2021-09-25: 3 [IU] via SUBCUTANEOUS
  Administered 2021-09-25: 2 [IU] via SUBCUTANEOUS
  Administered 2021-09-25: 3 [IU] via SUBCUTANEOUS
  Administered 2021-09-25: 2 [IU] via SUBCUTANEOUS
  Administered 2021-09-25: 3 [IU] via SUBCUTANEOUS
  Administered 2021-09-26 (×2): 5 [IU] via SUBCUTANEOUS
  Administered 2021-09-26: 3 [IU] via SUBCUTANEOUS
  Administered 2021-09-26: 5 [IU] via SUBCUTANEOUS
  Administered 2021-09-26: 8 [IU] via SUBCUTANEOUS
  Administered 2021-09-27: 3 [IU] via SUBCUTANEOUS
  Administered 2021-09-27 (×2): 5 [IU] via SUBCUTANEOUS
  Administered 2021-09-27 (×2): 3 [IU] via SUBCUTANEOUS
  Administered 2021-09-27: 5 [IU] via SUBCUTANEOUS
  Administered 2021-09-28: 2 [IU] via SUBCUTANEOUS
  Administered 2021-09-28: 5 [IU] via SUBCUTANEOUS
  Administered 2021-09-28 (×2): 3 [IU] via SUBCUTANEOUS
  Administered 2021-09-28: 2 [IU] via SUBCUTANEOUS
  Administered 2021-09-28: 8 [IU] via SUBCUTANEOUS
  Administered 2021-09-28 – 2021-09-29 (×2): 3 [IU] via SUBCUTANEOUS
  Administered 2021-09-29 (×2): 5 [IU] via SUBCUTANEOUS
  Administered 2021-09-29 (×3): 3 [IU] via SUBCUTANEOUS
  Administered 2021-09-30 (×2): 2 [IU] via SUBCUTANEOUS
  Administered 2021-09-30: 3 [IU] via SUBCUTANEOUS
  Administered 2021-09-30: 2 [IU] via SUBCUTANEOUS
  Administered 2021-09-30 – 2021-10-01 (×2): 3 [IU] via SUBCUTANEOUS
  Administered 2021-10-01: 5 [IU] via SUBCUTANEOUS
  Administered 2021-10-01: 2 [IU] via SUBCUTANEOUS
  Administered 2021-10-01 – 2021-10-02 (×2): 3 [IU] via SUBCUTANEOUS
  Administered 2021-10-02: 2 [IU] via SUBCUTANEOUS
  Administered 2021-10-02 – 2021-10-03 (×10): 3 [IU] via SUBCUTANEOUS

## 2021-09-24 NOTE — Telephone Encounter (Signed)
Eric from [facility] called, says the patient is being discharged from [facility] this weekend, and needs to speak to a nurse about several orders. Eric call back # [phone].

## 2021-09-24 NOTE — Progress Notes (Signed)
Multiple failed attempts made to wean patient off of levophed. Per Hospitalist and general surgeon, we may continue to use patient's right femoral TLC at this time due to her requirement for central venous access and limited IV access/availability.

## 2021-09-24 NOTE — Progress Notes (Signed)
RN called due to patient's blood glucose being persistently high (> 200s).  She was on steroids.  Patient was started on ISS and CBGs.

## 2021-09-24 NOTE — Progress Notes (Signed)
Nephrology Progress Note  Subjective:     Last HD on 10/5 with 2.2 kg UF (goal 2.5 kg and was reduced prior to the start due to hypotension.  Occ brief tachycardia up to 140 per HD RN charting .  Levo was off briefly but bp dropped so they are restarting.  Most recently was on 2-3 mcg/min.  She feels tight esp in legs.  She would like an extra tx today if possible   Review of systems:  Denies shortness of breath or chest pain  Some dizziness  Reports nausea and some vomiting    Objective Vital signs in last 24 hours: Vitals:   09/24/21 0650 09/24/21 0700 09/24/21 0730 09/24/21 0800  BP: 107/62 117/67 (!) 96/56 116/71  Pulse:    98  Resp: _0 Temp:  98 F (36.7 C)    TempSrc:  Oral    SpO2:    98%  Weight:      Height:       Weight change: 0 kg  Intake/Output Summary (Last 24 hours) at 09/24/2021 0911 Last data filed at 09/24/2021 0600 Gross per 24 hour  Intake 699.75 ml  Output 2273 ml  Net -1573.25 ml    Dialyzes at [facility]-  MWF-  has not been there since last Friday  EDW 122.5/  4 hours and 15 minutes/AVF-  15 gauge/1K bath/2.5 calc/BFR 300. Calcitriol 0.5 q treatment -  no ESA-  on last Friday left out at 121.8-  heparin 1000 bolus and q hour    Assessment/Plan: 55 year old BF with ESRD, many other problems including lymphedema and chronic thigh wound who presents to hospital with increased thigh wound pain/nausea and missed HD   1 Left thigh wound-  wound care per primary.  Abx per primary team - currently on vanc and cefepime.     2 ESRD: normally MWF via AVF-  prior to admission had not had treatment since 9/23.   - Continue HD per MWF schedule  - will schedule additional brief 2-3 hour UF only session today as staffing permits  3 Chronic hypotension: is overloaded but hypotensive -  continue midodrine with UF as able - decrease UF goal for today to 2.5 kg as tolerated - spoke with HD RN.  HD with aid of albumin.  PLEASE do not bolus with NS  unless in distress as is overloaded demonstrated by low sodium  4. Anemia of ESRD: not on ESA as OP?  on aranesp 100 mcg weekly on Fridays here - follow trends and reassess dosing  5. Metabolic Bone Disease: continue calcitriol 0.5 three times a week with HD (do not include on discharge med rec - is given at HD). phos controlled on phoslo and fosrenol  6. Hyperkalemia -  seems to be a chronic issue-  on 1 K bath as OP-  given lokelma and bicarb but went up-  1 k bath Friday in HD -  has maintained over weekend likely has a lower K diet here-  2 K diet for now.  Noted she was given K 40 meq supplement on 10/4 - please do not supplement K unless below 3.   7. pAfib - agents per primary team - on amio   Labs: Basic Metabolic Panel: Recent Labs  Lab 09/22/21 1227 09/23/21 0525 09/24/21 0430  NA 130* 129* 131*  K 3.2* 3.8 3.5  CL 91* 91* 91*  CO2 _1 GLUCOSE 193* 273* 274*  BUN 49* 56* 34*  CREATININE 5.11* 5.55* 3.65*  CALCIUM 8.3* 8.1* 8.4*  PHOS 4.2 3.9 4.3   Liver Function Tests: Recent Labs  Lab 09/17/21 1325 09/18/21 0620 09/19/21 0813 09/22/21 1227 09/23/21 0525 09/24/21 0430  AST 15 15  --   --   --   --   ALT 16 14  --   --   --   --   ALKPHOS 254* 223*  --   --   --   --   BILITOT 1.2 1.5*  --   --   --   --   PROT 7.8 7.3  --   --   --   --   ALBUMIN 3.3* 3.2*   < > 3.0* 2.8* 2.7*   < > = values in this interval not displayed.    CBC: Recent Labs  Lab 09/18/21 0620 09/21/21 0652 09/22/21 1227 09/23/21 0525 09/24/21 0430  WBC 9.9 29.1* 24.3* 19.4* 19.0*  NEUTROABS  --   --  21.2* 15.9* 17.1*  HGB 9.0* 8.4* 9.3* 9.0* 8.7*  HCT 26.8* 26.1* 28.1* 27.5* 27.1*  MCV 91.2 94.2 92.1 91.1 91.9  PLT 298 296 297 278 245    CBG: Recent Labs  Lab 09/19/21 1207 09/19/21 1735 09/20/21 2028 09/23/21 0011 09/24/21 0730  GLUCAP 108* 245* 197* 247* 268*    Iron Studies: No results for input(s): IRON, TIBC, TRANSFERRIN, FERRITIN in the last 72  hours. Studies/Results: CT FEMUR LEFT WO CONTRAST  Result Date: 09/22/2021 CLINICAL DATA:  Chronic left thigh wound.  Prior debridement. EXAM: CT OF THE LOWER LEFT EXTREMITY WITHOUT CONTRAST TECHNIQUE: Multidetector CT imaging of the lower left extremity was performed according to the standard protocol. COMPARISON:  Left femur x-rays dated September 17, 2021. CT left femur dated July 15, 2021. FINDINGS: Bones/Joint/Cartilage No bony destruction or periosteal reaction. In no fracture or dislocation. Left hip and knee degenerative changes. Chronic trace left knee joint effusion. Ligaments Ligaments are suboptimally evaluated by CT. Muscles and Tendons Grossly intact. Soft tissue Soft tissue wound along the anteromedial distal thigh with surrounding skin thickening and underlying subcutaneous edema. Prominent subcutaneous edema in the lateral thigh as well. Numerous scattered soft tissue calcifications again noted. No fluid collection or subcutaneous emphysema. No soft tissue mass. Reactive left inguinal lymph nodes. IMPRESSION: 1. Soft tissue wound along the anteromedial distal thigh with surrounding skin thickening and underlying subcutaneous edema. No fluid collection or subcutaneous emphysema. 2. No acute osseous abnormality. Electronically Signed   By: Titus Dubin M.D.   On: 09/22/2021 16:05   Medications: Infusions:  sodium chloride     sodium chloride     sodium chloride 10 mL/hr at 09/24/21 0600   albumin human Stopped (09/18/21 1912)   ceFEPime (MAXIPIME) IV 2 g (09/23/21 1810)   metronidazole Stopped (09/24/21 0013)   norepinephrine (LEVOPHED) Adult infusion Stopped (09/24/21 0843)   vancomycin 1,000 mg (09/23/21 1710)    Scheduled Medications:  amiodarone  200 mg Oral Daily   apixaban  5 mg Oral BID   calcitRIOL  0.5 mcg Oral Q M,W,F-HD   calcium acetate  1,334 mg Oral TID with meals   Chlorhexidine Gluconate Cloth  6 each Topical Q0600   darbepoetin (ARANESP) injection - DIALYSIS   100 mcg Intravenous Q Fri-HD   feeding supplement  237 mL Oral BID BM   hydrocortisone sod succinate (SOLU-CORTEF) inj  100 mg Intravenous Q8H   influenza vac split quadrivalent PF  0.5 mL Intramuscular Tomorrow-1000  insulin aspart  0-15 Units Subcutaneous Q4H   lanthanum  500 mg Oral TID WC   midodrine  15 mg Oral TID WC   multivitamin  1 tablet Oral QHS   pantoprazole  40 mg Oral BID   sodium chloride flush  10-40 mL Intracatheter Q12H    have reviewed scheduled and prn medications.  Physical Exam:  General adult female in bed chronically ill appearing  HEENT normocephalic atraumatic extraocular movements intact sclera anicteric Neck supple trachea midline Lungs clear to auscultation bilaterally normal work of breathing at rest  Heart tachycardic from 110's to low 120's no rub appreciated Abdomen soft nontender nondistended; obese habitus Extremities chronic lymphedema Psych normal mood and affect Neuro alert and oriented x 3 follows commands and conversant LUE AVF bruit and thrill    Claudia Desanctis, MD  09/24/2021,9:11 AM  LOS: 7 days

## 2021-09-24 NOTE — Telephone Encounter (Signed)
Called patient, daughter will pick up forms.

## 2021-09-24 NOTE — Procedures (Signed)
NEPHROLOGY NURSING NOTE:   [facility] main water supply line is under maintenance.  Dr. Royce Macadamia elects to cancel today's extra HD treatment due to potentially higher than usual chorine levels and increased risk of chlorine/chloramine exposure.  Pt is agreeable to extending treatment time tomorrow to maximize ultrafiltration.  She denies dyspnea and is saturating 97% on room air.   Rockwell Alexandria, RN

## 2021-09-24 NOTE — Progress Notes (Signed)
PROGRESS NOTE    [name]  UYQ:[id number] DOB: 06/13/1966 DOA: 09/17/2021 PCP: Noreene Larsson, NP   Brief Narrative:   [name] is a 55 year old female with past medical history significant for essential hypertension, ESRD on HD (MWF), chronic diastolic congestive heart failure, COPD, type 2 diabetes mellitus, chronic lymphedema, morbid obesity who presents to [facility] ED on 9/29 with worsening left thigh infection, generalized weakness/fatigue in the setting of missed dialysis sessions.  Patient been complaining of worsening pain and purulent foul-smelling discharge from her wound site.  She is recommended by her surgeons to have ongoing wound care, but developed septic shock likely related to left thigh wound with associated acute metabolic encephalopathy on 10/3 for which she currently remains on norepinephrine as well as multiple antibiotics.  Assessment & Plan:   Principal Problem:   Open wound of left thigh Active Problems:   Lymphedema of lower extremity   Diabetes mellitus (HCC)   Hyperkalemia   HTN (hypertension)   HLD (hyperlipidemia)   ESRD on dialysis (HCC)   Lymphedema   Obesity, Class III, BMI 40-49.9 (morbid obesity) (HCC)   Severe sepsis with septic shock (HCC)   Hyponatremia   Hypoalbuminemia due to protein-calorie malnutrition (HCC)   Elevated brain natriuretic peptide (BNP) level   Atrial fibrillation, chronic (HCC)   Pulmonary edema   Prolonged QT interval   Elevated alkaline phosphatase in newborn   Septic shock secondary to left thigh wound, not POA -CT left femur pending with possible need to transfer as needed -Continue IV antibiotics as ordered -Continues to require norepinephrine, appreciate general surgery with placement of right femoral line -Procalcitonin rising -Addition of hydrocortisone IV 10/5 -Adjustment of midodrine to 20 mg 3 times daily   Acute metabolic encephalopathy secondary to above -Appears to be  resolved -Continue to maintain MAP greater than 60   Acute on chronic diastolic congestive heart failure -Echocardiogram with LVEF 60-65% and grade 1 diastolic dysfunction on 05/23/8755 -Volume management per hemodialysis   ESRD on HD MWF -Nephrology following with hemodialysis performed 10/5   Hyponatremia -Per nephrology with hemodialysis   Paroxysmal atrial fibrillation -Continues on apixaban -Amiodarone 200 mg p.o. daily   History of chronic hypotension -Continues on Levophed as above, wean as tolerated -Adjusted midodrine to 20 mg 3 times daily   Type 2 diabetes Diet controlled at home with recent hemoglobin A1c 6.9%   Morbid obesity -Lifestyle changes outpatient   Weakness/fatigue/deconditioning -PT/OT recommending SNF, but refuses   DVT prophylaxis: Eliquis Code Status: Full Family Communication: Discussed with husband on phone 10/6 Disposition Plan:  Status is: Inpatient   Remains inpatient appropriate because:Hemodynamically unstable, IV treatments appropriate due to intensity of illness or inability to take PO, and Inpatient level of care appropriate due to severity of illness   Dispo: The patient is from: Home              Anticipated d/c is to: Home              Patient currently is not medically stable to d/c.              Difficult to place patient No   Nutritional Assessment:   The patient's BMI is: Body mass index is 46.7 kg/m.Marland Kitchen   Seen by dietician.  I agree with the assessment and plan as outlined below:   Nutrition Status:   Skin Assessment:   I have examined the patient's skin and I agree with the wound assessment as  performed by the wound care RN as outlined below:   Pressure Injury 07/04/21 Thigh Posterior;Proximal;Right Stage 2 -  Partial thickness loss of dermis presenting as a shallow open injury with a red, pink wound bed without slough. (Active)  07/04/21 1200  Location: Thigh (left thigh)  Location Orientation:  Posterior;Proximal;Right  Staging: Stage 2 -  Partial thickness loss of dermis presenting as a shallow open injury with a red, pink wound bed without slough.  Wound Description (Comments):   Present on Admission: No      Consultants:  Nephrology, Dr. Moshe Cipro Vascular surgery and plastic surgery - discussed with EDP   Procedures:  Sutures to left thigh wound placed by EDP on admission   Antimicrobials:  Vancomycin 10/3>> Cefepime 10/3>> Metronidazole 10/3>>   Subjective: Patient seen and evaluated today with no new acute complaints or concerns. No acute concerns or events noted overnight.  She continues to require norepinephrine for blood pressure support.  Objective: Vitals:   09/24/21 0900 09/24/21 0905 09/24/21 0915 09/24/21 0916  BP: (!) 80/48 93/71 (!) 74/56 (!) 85/49  Pulse: (!) 129 96    Resp: 20 15  (!) 22  Temp:      TempSrc:      SpO2: 95% 95%    Weight:      Height:        Intake/Output Summary (Last 24 hours) at 09/24/2021 1053 Last data filed at 09/24/2021 0600 Gross per 24 hour  Intake 699.75 ml  Output 2273 ml  Net -1573.25 ml   Filed Weights   09/23/21 0500 09/23/21 1345 09/23/21 1820  Weight: 129.1 kg 129.1 kg 126.8 kg    Examination:  General exam: Appears calm and comfortable  Respiratory system: Clear to auscultation. Respiratory effort normal. Cardiovascular system: S1 & S2 heard, RRR.  Gastrointestinal system: Abdomen is soft Central nervous system: Alert and awake Extremities: No edema, left thigh wound clean dry and intact Skin: No significant lesions noted Psychiatry: Flat affect.    Data Reviewed: I have personally reviewed following labs and imaging studies  CBC: Recent Labs  Lab 09/17/21 1325 09/18/21 0620 09/21/21 0652 09/22/21 1227 09/23/21 0525 09/24/21 0430  WBC 10.4 9.9 29.1* 24.3* 19.4* 19.0*  NEUTROABS 8.7*  --   --  21.2* 15.9* 17.1*  HGB 10.0* 9.0* 8.4* 9.3* 9.0* 8.7*  HCT 30.9* 26.8* 26.1* 28.1* 27.5*  27.1*  MCV 92.5 91.2 94.2 92.1 91.1 91.9  PLT 295 298 296 297 278 672   Basic Metabolic Panel: Recent Labs  Lab 09/17/21 1325 09/18/21 0620 09/19/21 0813 09/20/21 0511 09/21/21 0652 09/22/21 1227 09/23/21 0525 09/24/21 0430  NA 127* 130*   < > 130* 130* 130* 129* 131*  K 5.9* 6.3*   < > 4.3 4.4 3.2* 3.8 3.5  CL 88* 90*   < > 92* 94* 91* 91* 91*  CO2 21* 20*   < > 22 19* _0 GLUCOSE 106* 79   < > 180* 209* 193* 273* 274*  BUN 105* 114*   < > 71* 84* 49* 56* 34*  CREATININE 8.88* 9.30*   < > 6.43* 7.24* 5.11* 5.55* 3.65*  CALCIUM 8.2* 8.1*   < > 8.2* 8.2* 8.3* 8.1* 8.4*  MG 2.2 2.2  --   --   --   --   --  1.9  PHOS  --  7.4*   < > 5.8* 5.3* 4.2 3.9 4.3   < > = values in this interval not displayed.  GFR: Estimated Creatinine Clearance: 23.3 mL/min (A) (by C-G formula based on SCr of 3.65 mg/dL (H)). Liver Function Tests: Recent Labs  Lab 09/17/21 1325 09/18/21 0620 09/19/21 0813 09/20/21 0511 09/21/21 0652 09/22/21 1227 09/23/21 0525 09/24/21 0430  AST 15 15  --   --   --   --   --   --   ALT 16 14  --   --   --   --   --   --   ALKPHOS 254* 223*  --   --   --   --   --   --   BILITOT 1.2 1.5*  --   --   --   --   --   --   PROT 7.8 7.3  --   --   --   --   --   --   ALBUMIN 3.3* 3.2*   < > 3.2* 2.8* 3.0* 2.8* 2.7*   < > = values in this interval not displayed.   No results for input(s): LIPASE, AMYLASE in the last 168 hours. No results for input(s): AMMONIA in the last 168 hours. Coagulation Profile: Recent Labs  Lab 09/18/21 0620 09/21/21 1124  INR 1.6* 4.7*   Cardiac Enzymes: No results for input(s): CKTOTAL, CKMB, CKMBINDEX, TROPONINI in the last 168 hours. BNP (last 3 results) No results for input(s): PROBNP in the last 8760 hours. HbA1C: No results for input(s): HGBA1C in the last 72 hours. CBG: Recent Labs  Lab 09/19/21 1207 09/19/21 1735 09/20/21 2028 09/23/21 0011 09/24/21 0730  GLUCAP 108* 245* 197* 247* 268*   Lipid  Profile: No results for input(s): CHOL, HDL, LDLCALC, TRIG, CHOLHDL, LDLDIRECT in the last 72 hours. Thyroid Function Tests: No results for input(s): TSH, T4TOTAL, FREET4, T3FREE, THYROIDAB in the last 72 hours. Anemia Panel: No results for input(s): VITAMINB12, FOLATE, FERRITIN, TIBC, IRON, RETICCTPCT in the last 72 hours. Sepsis Labs: Recent Labs  Lab 09/17/21 1325 09/17/21 1433 09/21/21 0652 09/21/21 1124 09/21/21 1258 09/22/21 1227 09/23/21 0525  PROCALCITON  --   --  11.31  --   --  14.88 13.47  LATICACIDVEN 1.3 1.3  --  2.8* 1.4  --   --     Recent Results (from the past 240 hour(s))  Blood culture (routine x 2)     Status: None   Collection Time: 09/17/21  1:25 PM   Specimen: Right Antecubital; Blood  Result Value Ref Range Status   Specimen Description RIGHT ANTECUBITAL  Final   Special Requests   Final    Blood Culture adequate volume BOTTLES DRAWN AEROBIC AND ANAEROBIC   Culture   Final    NO GROWTH 5 DAYS Performed at [facility], [address] [place], [place] [address]    Report Status 09/22/2021 FINAL  Final  Resp Panel by RT-PCR (Flu A&B, Covid) Nasopharyngeal Swab     Status: None   Collection Time: 09/17/21  1:27 PM   Specimen: Nasopharyngeal Swab; Nasopharyngeal(NP) swabs in vial transport medium  Result Value Ref Range Status   SARS Coronavirus 2 by RT PCR NEGATIVE NEGATIVE Final    Comment: (NOTE) SARS-CoV-2 target nucleic acids are NOT DETECTED.  The SARS-CoV-2 RNA is generally detectable in upper respiratory specimens during the acute phase of infection. The lowest concentration of SARS-CoV-2 viral copies this assay can detect is 138 copies/mL. A negative result does not preclude SARS-Cov-2 infection and should not be used as the sole basis for treatment or other patient  management decisions. A negative result may occur with  improper specimen collection/handling, submission of specimen other than nasopharyngeal swab, presence of viral  mutation(s) within the areas targeted by this assay, and inadequate number of viral copies(<138 copies/mL). A negative result must be combined with clinical observations, patient history, and epidemiological information. The expected result is Negative.  Fact Sheet for Patients:  EntrepreneurPulse.com.au  Fact Sheet for Healthcare Providers:  IncredibleEmployment.be  This test is no t yet approved or cleared by the [place] FDA and  has been authorized for detection and/or diagnosis of SARS-CoV-2 by FDA under an Emergency Use Authorization (EUA). This EUA will remain  in effect (meaning this test can be used) for the duration of the COVID-19 declaration under Section 564(b)(1) of the Act, 21 U.S.C.section 360bbb-3(b)(1), unless the authorization is terminated  or revoked sooner.       Influenza A by PCR NEGATIVE NEGATIVE Final   Influenza B by PCR NEGATIVE NEGATIVE Final    Comment: (NOTE) The Xpert Xpress SARS-CoV-2/FLU/RSV plus assay is intended as an aid in the diagnosis of influenza from Nasopharyngeal swab specimens and should not be used as a sole basis for treatment. Nasal washings and aspirates are unacceptable for Xpert Xpress SARS-CoV-2/FLU/RSV testing.  Fact Sheet for Patients: EntrepreneurPulse.com.au  Fact Sheet for Healthcare Providers: IncredibleEmployment.be  This test is not yet approved or cleared by the [place] FDA and has been authorized for detection and/or diagnosis of SARS-CoV-2 by FDA under an Emergency Use Authorization (EUA). This EUA will remain in effect (meaning this test can be used) for the duration of the COVID-19 declaration under Section 564(b)(1) of the Act, 21 U.S.C. section 360bbb-3(b)(1), unless the authorization is terminated or revoked.  Performed at [facility], [address] [place], [place] [address]   Blood culture (routine x 2)     Status: None    Collection Time: 09/17/21  1:30 PM   Specimen: Right Antecubital; Blood  Result Value Ref Range Status   Specimen Description RIGHT ANTECUBITAL  Final   Special Requests   Final    BOTTLES DRAWN AEROBIC AND ANAEROBIC Blood Culture adequate volume   Culture   Final    NO GROWTH 5 DAYS Performed at [facility], [address], [place], [place] [address]    Report Status 09/22/2021 FINAL  Final  Culture, blood (routine x 2)     Status: None (Preliminary result)   Collection Time: 09/21/21 11:24 AM   Specimen: Hemodialysis Fistula; Blood  Result Value Ref Range Status   Specimen Description   Final    HEMODIALYSIS FISTULA BOTTLES DRAWN AEROBIC AND ANAEROBIC   Special Requests Blood Culture adequate volume  Final   Culture   Final    NO GROWTH 3 DAYS Performed at [facility], [address] [place], [place] [address]    Report Status PENDING  Incomplete  Culture, blood (routine x 2)     Status: None (Preliminary result)   Collection Time: 09/21/21 11:24 AM   Specimen: Hemodialysis Fistula; Blood  Result Value Ref Range Status   Specimen Description   Final    HEMODIALYSIS FISTULA BOTTLES DRAWN AEROBIC AND ANAEROBIC   Special Requests Blood Culture adequate volume  Final   Culture   Final    NO GROWTH 3 DAYS Performed at [facility], [address] [place], [place] [address]    Report Status PENDING  Incomplete  MRSA Next Gen by PCR, Nasal     Status: None   Collection  Time: 09/21/21  9:13 PM   Specimen: Nasal Mucosa; Nasal Swab  Result Value Ref Range Status   MRSA by PCR Next Gen NOT DETECTED NOT DETECTED Final    Comment: (NOTE) The GeneXpert MRSA Assay (FDA approved for NASAL specimens only), is one component of a comprehensive MRSA colonization surveillance program. It is not intended to diagnose MRSA infection nor to guide or monitor treatment for MRSA infections. Test performance is not FDA approved in patients less than 66 years old. Performed at [facility], [address] [place], [place] [address]          Radiology Studies: CT FEMUR LEFT WO CONTRAST  Result Date: 09/22/2021 CLINICAL DATA:  Chronic left thigh wound.  Prior debridement. EXAM: CT OF THE LOWER LEFT EXTREMITY WITHOUT CONTRAST TECHNIQUE: Multidetector CT imaging of the lower left extremity was performed according to the standard protocol. COMPARISON:  Left femur x-rays dated September 17, 2021. CT left femur dated July 15, 2021. FINDINGS: Bones/Joint/Cartilage No bony destruction or periosteal reaction. In no fracture or dislocation. Left hip and knee degenerative changes. Chronic trace left knee joint effusion. Ligaments Ligaments are suboptimally evaluated by CT. Muscles and Tendons Grossly intact. Soft tissue Soft tissue wound along the anteromedial distal thigh with surrounding skin thickening and underlying subcutaneous edema. Prominent subcutaneous edema in the lateral thigh as well. Numerous scattered soft tissue calcifications again noted. No fluid collection or subcutaneous emphysema. No soft tissue mass. Reactive left inguinal lymph nodes. IMPRESSION: 1. Soft tissue wound along the anteromedial distal thigh with surrounding skin thickening and underlying subcutaneous edema. No fluid collection or subcutaneous emphysema. 2. No acute osseous abnormality. Electronically Signed   By: Titus Dubin M.D.   On: 09/22/2021 16:05        Scheduled Meds:  amiodarone  200 mg Oral Daily   apixaban  5 mg Oral BID   calcitRIOL  0.5 mcg Oral Q M,W,F-HD   calcium acetate  1,334 mg Oral TID with meals   Chlorhexidine Gluconate Cloth  6 each Topical Q0600   Chlorhexidine Gluconate Cloth  6 each Topical Q0600   darbepoetin (ARANESP) injection - DIALYSIS  100 mcg Intravenous Q Fri-HD   feeding supplement  237 mL Oral BID BM   hydrocortisone sod succinate (SOLU-CORTEF) inj  100 mg Intravenous Q8H   influenza vac split quadrivalent PF  0.5 mL Intramuscular Tomorrow-1000   insulin aspart   0-15 Units Subcutaneous Q4H   lanthanum  500 mg Oral TID WC   midodrine  20 mg Oral TID WC   multivitamin  1 tablet Oral QHS   pantoprazole  40 mg Oral BID   sodium chloride flush  10-40 mL Intracatheter Q12H   Continuous Infusions:  sodium chloride     sodium chloride     sodium chloride 10 mL/hr at 09/24/21 0600   albumin human Stopped (09/18/21 1912)   ceFEPime (MAXIPIME) IV 2 g (09/23/21 1810)   metronidazole Stopped (09/24/21 0013)   norepinephrine (LEVOPHED) Adult infusion 2 mcg/min (09/24/21 0928)   vancomycin 1,000 mg (09/23/21 1710)     LOS: 7 days    Time spent: 35 minutes    Valkyrie Guardiola Darleen Crocker, DO [facility]  If 7PM-7AM, please contact night-coverage [email or web address] 09/24/2021, 10:53 AM

## 2021-09-25 DIAGNOSIS — S71102D Unspecified open wound, left thigh, subsequent encounter: Secondary | ICD-10-CM | POA: Diagnosis not present

## 2021-09-25 LAB — CBC WITH DIFFERENTIAL/PLATELET
Abs Immature Granulocytes: 0.24 10*3/uL — ABNORMAL HIGH (ref 0.00–0.07)
Basophils Absolute: 0 10*3/uL (ref 0.0–0.1)
Basophils Relative: 0 %
Eosinophils Absolute: 0 10*3/uL (ref 0.0–0.5)
Eosinophils Relative: 0 %
HCT: 27.3 % — ABNORMAL LOW (ref 36.0–46.0)
Hemoglobin: 9.1 g/dL — ABNORMAL LOW (ref 12.0–15.0)
Immature Granulocytes: 1 %
Lymphocytes Relative: 6 %
Lymphs Abs: 1.1 10*3/uL (ref 0.7–4.0)
MCH: 31.1 pg (ref 26.0–34.0)
MCHC: 33.3 g/dL (ref 30.0–36.0)
MCV: 93.2 fL (ref 80.0–100.0)
Monocytes Absolute: 0.8 10*3/uL (ref 0.1–1.0)
Monocytes Relative: 4 %
Neutro Abs: 17 10*3/uL — ABNORMAL HIGH (ref 1.7–7.7)
Neutrophils Relative %: 89 %
Platelets: 255 10*3/uL (ref 150–400)
RBC: 2.93 MIL/uL — ABNORMAL LOW (ref 3.87–5.11)
RDW: 22.7 % — ABNORMAL HIGH (ref 11.5–15.5)
WBC: 19.2 10*3/uL — ABNORMAL HIGH (ref 4.0–10.5)
nRBC: 1.1 % — ABNORMAL HIGH (ref 0.0–0.2)

## 2021-09-25 LAB — RENAL FUNCTION PANEL
Albumin: 2.8 g/dL — ABNORMAL LOW (ref 3.5–5.0)
Anion gap: 12 (ref 5–15)
BUN: 51 mg/dL — ABNORMAL HIGH (ref 6–20)
CO2: 26 mmol/L (ref 22–32)
Calcium: 8.8 mg/dL — ABNORMAL LOW (ref 8.9–10.3)
Chloride: 91 mmol/L — ABNORMAL LOW (ref 98–111)
Creatinine, Ser: 4.4 mg/dL — ABNORMAL HIGH (ref 0.44–1.00)
GFR, Estimated: 11 mL/min — ABNORMAL LOW (ref >60)
Glucose, Bld: 180 mg/dL — ABNORMAL HIGH (ref 70–99)
Phosphorus: 4.2 mg/dL (ref 2.5–4.6)
Potassium: 3.5 mmol/L (ref 3.5–5.1)
Sodium: 129 mmol/L — ABNORMAL LOW (ref 135–145)

## 2021-09-25 LAB — GLUCOSE, CAPILLARY
Glucose-Capillary: 126 mg/dL — ABNORMAL HIGH (ref 70–99)
Glucose-Capillary: 134 mg/dL — ABNORMAL HIGH (ref 70–99)
Glucose-Capillary: 139 mg/dL — ABNORMAL HIGH (ref 70–99)
Glucose-Capillary: 162 mg/dL — ABNORMAL HIGH (ref 70–99)
Glucose-Capillary: 188 mg/dL — ABNORMAL HIGH (ref 70–99)
Glucose-Capillary: 193 mg/dL — ABNORMAL HIGH (ref 70–99)

## 2021-09-25 LAB — MAGNESIUM: Magnesium: 2.1 mg/dL (ref 1.7–2.4)

## 2021-09-25 MED ORDER — ALBUMIN HUMAN 25 % IV SOLN
25.0000 g | Freq: Once | INTRAVENOUS | Status: AC
  Administered 2021-09-25: 25 g via INTRAVENOUS

## 2021-09-25 MED ORDER — CHLORHEXIDINE GLUCONATE CLOTH 2 % EX PADS
6.0000 | MEDICATED_PAD | Freq: Every day | CUTANEOUS | Status: DC
  Administered 2021-09-25 – 2021-09-26 (×2): 6 via TOPICAL

## 2021-09-25 MED ORDER — CHLORHEXIDINE GLUCONATE CLOTH 2 % EX PADS: 6.0000 | MEDICATED_PAD | Freq: Every day | CUTANEOUS | Status: DC

## 2021-09-25 MED ORDER — INSULIN DETEMIR 100 UNIT/ML ~~LOC~~ SOLN
5.0000 [IU] | Freq: Every day | SUBCUTANEOUS | Status: DC
  Administered 2021-09-25 – 2021-10-03 (×9): 5 [IU] via SUBCUTANEOUS
  Filled 2021-09-25 (×12): qty 0.05

## 2021-09-25 NOTE — Progress Notes (Signed)
OT Cancellation Note  Patient Details Name: [name] MRN: [id number] DOB: [birth year]   Cancelled Treatment:    Reason Eval/Treat Not Completed: Medical issues which prohibited therapy. Pt was moved to higher level of care and will need new OT order to resume therapy when pt is medically stable.   Beldon Nowling OT, MOT   Larey Seat 09/25/2021, 8:17 AM

## 2021-09-25 NOTE — Progress Notes (Addendum)
Inpatient Diabetes Program Recommendations  AACE/ADA: New Consensus Statement on Inpatient Glycemic Control (2015)  Target Ranges:  Prepandial:   less than 140 mg/dL      Peak postprandial:   less than 180 mg/dL (1-2 hours)      Critically ill patients:  140 - 180 mg/dL   Lab Results  Component Value Date   GLUCAP 126 (H) 09/25/2021   HGBA1C 6.8 (H) 09/18/2021    Review of Glycemic Control Results for NYILAH, KIGHT (MRN [id number]) as of 09/25/2021 10:49  Ref. Range 09/24/2021 16:41 09/24/2021 20:23 09/24/2021 23:35 09/25/2021 04:05 09/25/2021 07:27  Glucose-Capillary Latest Ref Range: 70 - 99 mg/dL 297 (H) 278 (H) 235 (H) 162 (H) 126 (H)   Diabetes history: DM 2  Outpatient Diabetes medications: None Current orders for Inpatient glycemic control:  Novolog moderate q 4 hours Solucortef 100 mg q 8 hours  Inpatient Diabetes Program Recommendations:    Blood sugars greater than goal. Consider adding Levemir 5 units daily.  As steroids are tapered, insulin will need to be reduced as well.   Thanks,  Adah Perl, RN, BC-ADM Inpatient Diabetes Coordinator Pager [phone]  (8a-5p)

## 2021-09-25 NOTE — Progress Notes (Signed)
Nephrology Progress Note  Subjective:     Extra UF only treatment cancelled yesterday due to water issue with city of [place] and out of abundance of caution with their plans for high amounts chlorine to treat the water.  Last HD on 10/5 with 2.2 kg UF (goal 2.5 kg and was reduced prior to the start due to hypotension.  Levo at 2 mcg/min and note her midodrine was increased to 20 mg TID yesterday.  She takes "three little pills" three times a day at home c/w her reported home dose of 15 mg TID.  Review of systems:  Denies shortness of breath or chest pain  No dizziness  Reports mild nausea no vomiting but hx of emesis previously   Objective Vital signs in last 24 hours: Vitals:   09/25/21 0800 09/25/21 0815 09/25/21 0830 09/25/21 0845  BP: 124/74 113/83 127/77 99/65  Pulse: 83     Resp: _0 Temp:      TempSrc:      SpO2: 97%     Weight:      Height:       Weight change: 1.6 kg  Intake/Output Summary (Last 24 hours) at 09/25/2021 0953 Last data filed at 09/25/2021 0600 Gross per 24 hour  Intake 692.01 ml  Output --  Net 692.01 ml    Dialyzes at [facility]-  MWF-  has not been there since last Friday  EDW 122.5/  4 hours and 15 minutes/AVF-  15 gauge/1K bath/2.5 calc/BFR 300. Calcitriol 0.5 q treatment -  no ESA-  on last Friday left out at 121.8-  heparin 1000 bolus and q hour    Assessment/Plan: 55 year old BF with ESRD, many other problems including lymphedema and chronic thigh wound who presents to hospital with increased thigh wound pain/nausea and missed HD   1 Left thigh wound-  wound care per primary.  Abx per primary team - currently on vanc and cefepime.     2 ESRD: normally MWF via AVF-  prior to admission had not had treatment since 9/23.   - Continue HD per MWF schedule.  Goal for UF only treatment tomorrow if able   3 Chronic hypotension: is overloaded but hypotensive -  continue midodrine with UF as able - decrease UF goal for today to 2.5  kg as tolerated - spoke with HD RN.  HD with aid of albumin.  PLEASE do not bolus with NS unless in distress as is overloaded demonstrated by low sodium  4. Anemia of ESRD: not on ESA as OP?  on aranesp 100 mcg weekly on Fridays here - follow trends and reassess dosing  5. Metabolic Bone Disease: continue calcitriol 0.5 three times a week with HD (do not include on discharge med rec - is given at HD). phos controlled on phoslo and fosrenol which are her home meds  6. Hyperkalemia -  seems to be a chronic issue as on 1 K bath as OP- had 1K bath with initial tx here. she likely has a lower K diet here than at home   7. pAfib - agents per primary team - on amio   Please contact nephrology if needed over the weekend - will plan to see her again on Monday.  Labs: Basic Metabolic Panel: Recent Labs  Lab 09/23/21 0525 09/24/21 0430 09/25/21 0446  NA 129* 131* 129*  K 3.8 3.5 3.5  CL 91* 91* 91*  CO2 _1 GLUCOSE 273* 274* 180*  BUN 56* 34* 51*  CREATININE 5.55* 3.65* 4.40*  CALCIUM 8.1* 8.4* 8.8*  PHOS 3.9 4.3 4.2   Liver Function Tests: Recent Labs  Lab 09/23/21 0525 09/24/21 0430 09/25/21 0446  ALBUMIN 2.8* 2.7* 2.8*    CBC: Recent Labs  Lab 09/21/21 0652 09/21/21 0652 09/22/21 1227 09/23/21 0525 09/24/21 0430 09/25/21 0446  WBC 29.1*  --  24.3* 19.4* 19.0* 19.2*  NEUTROABS  --    < > 21.2* 15.9* 17.1* 17.0*  HGB 8.4*  --  9.3* 9.0* 8.7* 9.1*  HCT 26.1*  --  28.1* 27.5* 27.1* 27.3*  MCV 94.2  --  92.1 91.1 91.9 93.2  PLT 296  --  297 278 245 255   < > = values in this interval not displayed.    CBG: Recent Labs  Lab 09/24/21 1641 09/24/21 2023 09/24/21 2335 09/25/21 0405 09/25/21 0727  GLUCAP 297* 278* 235* 162* 126*    Studies/Results: No results found. Medications: Infusions:  sodium chloride     sodium chloride     sodium chloride 10 mL/hr at 09/25/21 0600   albumin human Stopped (09/18/21 1912)   ceFEPime (MAXIPIME) IV 2 g (09/23/21 1810)    metronidazole Stopped (09/25/21 0032)   norepinephrine (LEVOPHED) Adult infusion 3 mcg/min (09/25/21 0600)   vancomycin 1,000 mg (09/23/21 1710)    Scheduled Medications:  amiodarone  200 mg Oral Daily   apixaban  5 mg Oral BID   calcitRIOL  0.5 mcg Oral Q M,W,F-HD   calcium acetate  1,334 mg Oral TID with meals   Chlorhexidine Gluconate Cloth  6 each Topical Q0600   darbepoetin (ARANESP) injection - DIALYSIS  100 mcg Intravenous Q Fri-HD   feeding supplement  237 mL Oral BID BM   hydrocortisone sod succinate (SOLU-CORTEF) inj  100 mg Intravenous Q8H   influenza vac split quadrivalent PF  0.5 mL Intramuscular Tomorrow-1000   insulin aspart  0-15 Units Subcutaneous Q4H   lanthanum  500 mg Oral TID WC   midodrine  20 mg Oral TID WC   multivitamin  1 tablet Oral QHS   pantoprazole  40 mg Oral BID   sodium chloride flush  10-40 mL Intracatheter Q12H    have reviewed scheduled and prn medications.  Physical Exam:  General adult female in bed chronically ill appearing  HEENT normocephalic atraumatic extraocular movements intact sclera anicteric Neck supple trachea midline Lungs clear to auscultation bilaterally normal work of breathing at rest  Heart tachycardic low 100 range  no rub appreciated Abdomen soft nontender nondistended; obese habitus Extremities chronic lymphedema legs wrapped today Psych normal mood and affect Neuro alert and oriented x 3 follows commands and conversant LUE AVF bruit and thrill +   Claudia Desanctis, MD  09/25/2021,9:53 AM  LOS: 8 days

## 2021-09-25 NOTE — Progress Notes (Signed)
PROGRESS NOTE    [name]  [name]:[id number] DOB: 1966/10/03 DOA: 09/17/2021 PCP: Noreene Larsson, NP   Brief Narrative:   [name] is a 55 year old female with past medical history significant for essential hypertension, ESRD on HD (MWF), chronic diastolic congestive heart failure, COPD, type 2 diabetes mellitus, chronic lymphedema, morbid obesity who presents to [facility], ED on 9/29 with worsening left thigh infection, generalized weakness/fatigue in the setting of missed dialysis sessions.  Patient been complaining of worsening pain and purulent foul-smelling discharge from her wound site.  She is recommended by her surgeons to have ongoing wound care, but developed septic shock likely related to left thigh wound with associated acute metabolic encephalopathy on 10/3 for which she currently remains on norepinephrine as well as multiple antibiotics.  Assessment & Plan:   Principal Problem:   Open wound of left thigh Active Problems:   Lymphedema of lower extremity   Diabetes mellitus (HCC)   Hyperkalemia   HTN (hypertension)   HLD (hyperlipidemia)   ESRD on dialysis (HCC)   Lymphedema   Obesity, Class III, BMI 40-49.9 (morbid obesity) (HCC)   Severe sepsis with septic shock (HCC)   Hyponatremia   Hypoalbuminemia due to protein-calorie malnutrition (HCC)   Elevated brain natriuretic peptide (BNP) level   Atrial fibrillation, chronic (HCC)   Pulmonary edema   Prolonged QT interval   Elevated alkaline phosphatase in newborn   Septic shock secondary to left thigh wound, not POA -CT left femur pending with possible need to transfer as needed -Continue IV antibiotics as ordered day 5/7 -Continues to require norepinephrine, appreciate general surgery with placement of right femoral line -Procalcitonin rising -Addition of hydrocortisone IV 10/5 -Adjustment of midodrine to 20 mg 3 times daily on 86/5   Acute metabolic encephalopathy secondary to above -Appears to be  resolved -Continue to maintain MAP greater than 60   Acute on chronic diastolic congestive heart failure -Echocardiogram with LVEF 60-65% and grade 1 diastolic dysfunction on 06/27/4695 -Volume management per hemodialysis   ESRD on HD MWF -Nephrology following with hemodialysis performed 10/5   Hyponatremia -Per nephrology with hemodialysis   Paroxysmal atrial fibrillation -Continues on apixaban -Amiodarone 200 mg p.o. daily   History of chronic hypotension -Continues on Levophed as above, wean as tolerated -Adjusted midodrine to 20 mg 3 times daily   Type 2 diabetes Diet controlled at home with recent hemoglobin A1c 6.9% Levemir 5 units to be added 10/7 due to steroid-induced hyperglycemia   Morbid obesity -Lifestyle changes outpatient   Weakness/fatigue/deconditioning -PT/OT recommending SNF, but refuses   DVT prophylaxis: Eliquis Code Status: Full Family Communication: Discussed with husband on phone 10/6 Disposition Plan:  Status is: Inpatient   Remains inpatient appropriate because:Hemodynamically unstable, IV treatments appropriate due to intensity of illness or inability to take PO, and Inpatient level of care appropriate due to severity of illness   Dispo: The patient is from: Home              Anticipated d/c is to: Home              Patient currently is not medically stable to d/c.              Difficult to place patient No   Nutritional Assessment:   The patient's BMI is: Body mass index is 46.7 kg/m.Marland Kitchen   Seen by dietician.  I agree with the assessment and plan as outlined below:   Nutrition Status:   Skin Assessment:  I have examined the patient's skin and I agree with the wound assessment as performed by the wound care RN as outlined below:   Pressure Injury 07/04/21 Thigh Posterior;Proximal;Right Stage 2 -  Partial thickness loss of dermis presenting as a shallow open injury with a red, pink wound bed without slough. (Active)  07/04/21 1200   Location: Thigh (left thigh)  Location Orientation: Posterior;Proximal;Right  Staging: Stage 2 -  Partial thickness loss of dermis presenting as a shallow open injury with a red, pink wound bed without slough.  Wound Description (Comments):   Present on Admission: No      Consultants:  Nephrology, Dr. Moshe Cipro Vascular surgery and plastic surgery - discussed with EDP   Procedures:  Sutures to left thigh wound placed by EDP on admission   Antimicrobials:  Vancomycin 10/3>> Cefepime 10/3>> Metronidazole 10/3>>   Subjective: Patient seen and evaluated today with no new acute complaints or concerns. No acute concerns or events noted overnight.  She continues to require norepinephrine, but is weaning very slowly.  Objective: Vitals:   09/25/21 0800 09/25/21 0815 09/25/21 0830 09/25/21 0845  BP: 124/74 113/83 127/77 99/65  Pulse: 83     Resp: _0 Temp:      TempSrc:      SpO2: 97%     Weight:      Height:        Intake/Output Summary (Last 24 hours) at 09/25/2021 1122 Last data filed at 09/25/2021 0600 Gross per 24 hour  Intake 692.01 ml  Output --  Net 692.01 ml   Filed Weights   09/23/21 1345 09/23/21 1820 09/25/21 0455  Weight: 129.1 kg 126.8 kg 130.7 kg    Examination:  General exam: Appears calm and comfortable  Respiratory system: Clear to auscultation. Respiratory effort normal.  Currently on 1 L nasal cannula Cardiovascular system: S1 & S2 heard, RRR.  Gastrointestinal system: Abdomen is soft Central nervous system: Alert and awake Extremities: No edema, left thigh wound clean dry and intact Skin: No significant lesions noted Psychiatry: Flat affect.    Data Reviewed: I have personally reviewed following labs and imaging studies  CBC: Recent Labs  Lab 09/21/21 0652 09/22/21 1227 09/23/21 0525 09/24/21 0430 09/25/21 0446  WBC 29.1* 24.3* 19.4* 19.0* 19.2*  NEUTROABS  --  21.2* 15.9* 17.1* 17.0*  HGB 8.4* 9.3* 9.0* 8.7* 9.1*  HCT  26.1* 28.1* 27.5* 27.1* 27.3*  MCV 94.2 92.1 91.1 91.9 93.2  PLT 296 297 278 245 150   Basic Metabolic Panel: Recent Labs  Lab 09/21/21 0652 09/22/21 1227 09/23/21 0525 09/24/21 0430 09/25/21 0446  NA 130* 130* 129* 131* 129*  K 4.4 3.2* 3.8 3.5 3.5  CL 94* 91* 91* 91* 91*  CO2 19* _1 GLUCOSE 209* 193* 273* 274* 180*  BUN 84* 49* 56* 34* 51*  CREATININE 7.24* 5.11* 5.55* 3.65* 4.40*  CALCIUM 8.2* 8.3* 8.1* 8.4* 8.8*  MG  --   --   --  1.9 2.1  PHOS 5.3* 4.2 3.9 4.3 4.2   GFR: Estimated Creatinine Clearance: 19.7 mL/min (A) (by C-G formula based on SCr of 4.4 mg/dL (H)). Liver Function Tests: Recent Labs  Lab 09/21/21 0652 09/22/21 1227 09/23/21 0525 09/24/21 0430 09/25/21 0446  ALBUMIN 2.8* 3.0* 2.8* 2.7* 2.8*   No results for input(s): LIPASE, AMYLASE in the last 168 hours. No results for input(s): AMMONIA in the last 168 hours. Coagulation Profile: Recent Labs  Lab 09/21/21 1124  INR 4.7*   Cardiac Enzymes: No results for input(s): CKTOTAL, CKMB, CKMBINDEX, TROPONINI in the last 168 hours. BNP (last 3 results) No results for input(s): PROBNP in the last 8760 hours. HbA1C: No results for input(s): HGBA1C in the last 72 hours. CBG: Recent Labs  Lab 09/24/21 1641 09/24/21 2023 09/24/21 2335 09/25/21 0405 09/25/21 0727  GLUCAP 297* 278* 235* 162* 126*   Lipid Profile: No results for input(s): CHOL, HDL, LDLCALC, TRIG, CHOLHDL, LDLDIRECT in the last 72 hours. Thyroid Function Tests: No results for input(s): TSH, T4TOTAL, FREET4, T3FREE, THYROIDAB in the last 72 hours. Anemia Panel: No results for input(s): VITAMINB12, FOLATE, FERRITIN, TIBC, IRON, RETICCTPCT in the last 72 hours. Sepsis Labs: Recent Labs  Lab 09/21/21 9244 09/21/21 1124 09/21/21 1258 09/22/21 1227 09/23/21 0525  PROCALCITON 11.31  --   --  14.88 13.47  LATICACIDVEN  --  2.8* 1.4  --   --     Recent Results (from the past 240 hour(s))  Blood culture (routine x 2)      Status: None   Collection Time: 09/17/21  1:25 PM   Specimen: Right Antecubital; Blood  Result Value Ref Range Status   Specimen Description RIGHT ANTECUBITAL  Final   Special Requests   Final    Blood Culture adequate volume BOTTLES DRAWN AEROBIC AND ANAEROBIC   Culture   Final    NO GROWTH 5 DAYS Performed at [facility], [address], [place], [place] [address]    Report Status 09/22/2021 FINAL  Final  Resp Panel by RT-PCR (Flu A&B, Covid) Nasopharyngeal Swab     Status: None   Collection Time: 09/17/21  1:27 PM   Specimen: Nasopharyngeal Swab; Nasopharyngeal(NP) swabs in vial transport medium  Result Value Ref Range Status   SARS Coronavirus 2 by RT PCR NEGATIVE NEGATIVE Final    Comment: (NOTE) SARS-CoV-2 target nucleic acids are NOT DETECTED.  The SARS-CoV-2 RNA is generally detectable in upper respiratory specimens during the acute phase of infection. The lowest concentration of SARS-CoV-2 viral copies this assay can detect is 138 copies/mL. A negative result does not preclude SARS-Cov-2 infection and should not be used as the sole basis for treatment or other patient management decisions. A negative result may occur with  improper specimen collection/handling, submission of specimen other than nasopharyngeal swab, presence of viral mutation(s) within the areas targeted by this assay, and inadequate number of viral copies(<138 copies/mL). A negative result must be combined with clinical observations, patient history, and epidemiological information. The expected result is Negative.  Fact Sheet for Patients:  EntrepreneurPulse.com.au  Fact Sheet for Healthcare Providers:  IncredibleEmployment.be  This test is no t yet approved or cleared by the [place] FDA and  has been authorized for detection and/or diagnosis of SARS-CoV-2 by FDA under an Emergency Use Authorization (EUA). This EUA will remain  in effect (meaning this test  can be used) for the duration of the COVID-19 declaration under Section 564(b)(1) of the Act, 21 U.S.C.section 360bbb-3(b)(1), unless the authorization is terminated  or revoked sooner.       Influenza A by PCR NEGATIVE NEGATIVE Final   Influenza B by PCR NEGATIVE NEGATIVE Final    Comment: (NOTE) The Xpert Xpress SARS-CoV-2/FLU/RSV plus assay is intended as an aid in the diagnosis of influenza from Nasopharyngeal swab specimens and should not be used as a sole basis for treatment. Nasal washings and aspirates are unacceptable for Xpert Xpress SARS-CoV-2/FLU/RSV testing.  Fact Sheet for Patients: EntrepreneurPulse.com.au  Fact  Sheet for Healthcare Providers: IncredibleEmployment.be  This test is not yet approved or cleared by the [place] and has been authorized for detection and/or diagnosis of SARS-CoV-2 by FDA under an Emergency Use Authorization (EUA). This EUA will remain in effect (meaning this test can be used) for the duration of the COVID-19 declaration under Section 564(b)(1) of the Act, 21 U.S.C. section 360bbb-3(b)(1), unless the authorization is terminated or revoked.  Performed at [facility], [address], [place], [place] [address]   Blood culture (routine x 2)     Status: None   Collection Time: 09/17/21  1:30 PM   Specimen: Right Antecubital; Blood  Result Value Ref Range Status   Specimen Description RIGHT ANTECUBITAL  Final   Special Requests   Final    BOTTLES DRAWN AEROBIC AND ANAEROBIC Blood Culture adequate volume   Culture   Final    NO GROWTH 5 DAYS Performed at [facility], [address], [place], [place] [address]    Report Status 09/22/2021 FINAL  Final  Culture, blood (routine x 2)     Status: None (Preliminary result)   Collection Time: 09/21/21 11:24 AM   Specimen: Hemodialysis Fistula; Blood  Result Value Ref Range Status   Specimen Description   Final    HEMODIALYSIS FISTULA BOTTLES DRAWN  AEROBIC AND ANAEROBIC   Special Requests Blood Culture adequate volume  Final   Culture   Final    NO GROWTH 4 DAYS Performed at [facility], [address], [place], [place] [address]    Report Status PENDING  Incomplete  Culture, blood (routine x 2)     Status: None (Preliminary result)   Collection Time: 09/21/21 11:24 AM   Specimen: Hemodialysis Fistula; Blood  Result Value Ref Range Status   Specimen Description   Final    HEMODIALYSIS FISTULA BOTTLES DRAWN AEROBIC AND ANAEROBIC   Special Requests Blood Culture adequate volume  Final   Culture   Final    NO GROWTH 4 DAYS Performed at [facility], [address], [place], [place] [address]    Report Status PENDING  Incomplete  MRSA Next Gen by PCR, Nasal     Status: None   Collection Time: 09/21/21  9:13 PM   Specimen: Nasal Mucosa; Nasal Swab  Result Value Ref Range Status   MRSA by PCR Next Gen NOT DETECTED NOT DETECTED Final    Comment: (NOTE) The GeneXpert MRSA Assay (FDA approved for NASAL specimens only), is one component of a comprehensive MRSA colonization surveillance program. It is not intended to diagnose MRSA infection nor to guide or monitor treatment for MRSA infections. Test performance is not FDA approved in patients less than 61 years old. Performed at [facility], [address] [place], [place] [address]          Radiology Studies: No results found.      Scheduled Meds:  amiodarone  200 mg Oral Daily   apixaban  5 mg Oral BID   calcitRIOL  0.5 mcg Oral Q M,W,F-HD   calcium acetate  1,334 mg Oral TID with meals   Chlorhexidine Gluconate Cloth  6 each Topical Q0600   Chlorhexidine Gluconate Cloth  6 each Topical Q0600   Chlorhexidine Gluconate Cloth  6 each Topical Q0600   darbepoetin (ARANESP) injection - DIALYSIS  100 mcg Intravenous Q Fri-HD   feeding supplement  237 mL Oral BID BM   hydrocortisone sod succinate (SOLU-CORTEF) inj  100 mg Intravenous Q8H   influenza vac  split  quadrivalent PF  0.5 mL Intramuscular Tomorrow-1000   insulin aspart  0-15 Units Subcutaneous Q4H   insulin detemir  5 Units Subcutaneous Daily   lanthanum  500 mg Oral TID WC   midodrine  20 mg Oral TID WC   multivitamin  1 tablet Oral QHS   pantoprazole  40 mg Oral BID   sodium chloride flush  10-40 mL Intracatheter Q12H   Continuous Infusions:  sodium chloride     sodium chloride     sodium chloride 10 mL/hr at 09/25/21 0600   albumin human Stopped (09/18/21 1912)   ceFEPime (MAXIPIME) IV 2 g (09/23/21 1810)   metronidazole Stopped (09/25/21 0032)   norepinephrine (LEVOPHED) Adult infusion 2 mcg/min (09/25/21 0956)   vancomycin 1,000 mg (09/23/21 1710)     LOS: 8 days    Time spent: 35 minutes    Charlen Bakula Darleen Crocker, DO [facility]  If 7PM-7AM, please contact night-coverage [email or web address] 09/25/2021, 11:22 AM

## 2021-09-25 NOTE — Procedures (Signed)
   HEMODIALYSIS TREATMENT NOTE:   4.5 hour treatment completed via left forearm AVF (15g/antegrade).  Levophed had been weaned off before starting HD.  Pre-HD 101/67 with MAP 77.  Afib 90s.  She was 8.3 kg over her EDW.  HD was initiated with an Albumin 25g bolus and a goal of 3.5 liters.  HR^ to 120s fifteen minutes into the session.  Qb was lowered to 350 and HR returned to 90s.  Fifteen minutes after that, BP dropped to 83/53.  Levophed was resumed at 24mg/min.  BP rebounded to 125/68 and UF goal was increased to 4 liters.  She tolerated this for about one hour, although she had frequent multi-focal PVCs and two episodes of ventricular bigeminy which lasted less than 10 seconds each.  Dinner arrived and she wanted to eat a few bites, stating she has eaten before during dialysis without complications.  Ten minutes into the meal HR^ 160s and BP dropped to 89/52.  She denied dyspnea or lightheadedness but stated she could tell her "heart is racing."  UF was stopped, meal tray was removed, HOB was lowered, and scheduled Albumin 25g dose was given. Over the next 15 minutes, HR lowered to 120s, then 100s and UF was resumed at a lower goal / rate.  Levophed infusion continued at 250m/min.  She tolerated the remainder of the treatment without problems.  Seems to tolerate UF rate no greater than 950 ml / hour.  Total run time: 4.5 hours Net UF:  3.2 liters  All blood was returned and hemostasis was achieved in 15 minutes.  Extra treatment is already scheduled for tomorrow.    AnRockwell AlexandriaRN

## 2021-09-25 NOTE — Progress Notes (Signed)
Pharmacy Antibiotic Note  [name] is a 55 y.o. female admitted on 09/17/2021 with septic shock secondary to left thigh wound  Pharmacy has been consulted for Vancomycin and cefepime dosing.  Plan: Vancomycin 1070m after each HD session on NWF Cefepime 2gm IV after HD on MWF. Day 5/7 Monitor V/S, labs and levels as indicated  Height: _0  (165.1 cm) Weight: 130.7 kg (288 lb 2.3 oz) IBW/kg (Calculated) : 57  Temp (24hrs), Avg:97.8 F (36.6 C), Min:97.5 F (36.4 C), Max:98.4 F (36.9 C)  Recent Labs  Lab 09/21/21 0652 09/21/21 1124 09/21/21 1258 09/22/21 1227 09/23/21 0525 09/24/21 0430 09/25/21 0446  WBC 29.1*  --   --  24.3* 19.4* 19.0* 19.2*  CREATININE 7.24*  --   --  5.11* 5.55* 3.65* 4.40*  LATICACIDVEN  --  2.8* 1.4  --   --   --   --      Estimated Creatinine Clearance: 19.7 mL/min (A) (by C-G formula based on SCr of 4.4 mg/dL (H)).    Allergies  Allergen Reactions   Contrast Media [Iodinated Diagnostic Agents] Anaphylaxis, Hives, Swelling and Other (See Comments)    Dye for cardiac cath. Tongue swells   Pneumococcal Vaccines Swelling and Other (See Comments)    Turns skin black, and bodily swelling   Vancomycin Nausea And Vomiting and Other (See Comments)    Infusion "made me feel like I was dying" had to be readmitted to hospital    Antimicrobials this admission: Vancomycin 10/3 >>  Cefepime  10/3 >>  Flagyl 10/3>>  Microbiology results: 10/3 BCx: ngtd  MRSA PCR: negative  Thank you for allowing pharmacy to be a part of this patient's care.  SMargot Ables PharmD Clinical Pharmacist 09/25/2021 12:44 PM

## 2021-09-26 DIAGNOSIS — S71102D Unspecified open wound, left thigh, subsequent encounter: Secondary | ICD-10-CM | POA: Diagnosis not present

## 2021-09-26 LAB — RENAL FUNCTION PANEL
Albumin: 3.1 g/dL — ABNORMAL LOW (ref 3.5–5.0)
Anion gap: 11 (ref 5–15)
BUN: 35 mg/dL — ABNORMAL HIGH (ref 6–20)
CO2: 29 mmol/L (ref 22–32)
Calcium: 8.8 mg/dL — ABNORMAL LOW (ref 8.9–10.3)
Chloride: 93 mmol/L — ABNORMAL LOW (ref 98–111)
Creatinine, Ser: 3.12 mg/dL — ABNORMAL HIGH (ref 0.44–1.00)
GFR, Estimated: 17 mL/min — ABNORMAL LOW (ref >60)
Glucose, Bld: 229 mg/dL — ABNORMAL HIGH (ref 70–99)
Phosphorus: 3.8 mg/dL (ref 2.5–4.6)
Potassium: 3.3 mmol/L — ABNORMAL LOW (ref 3.5–5.1)
Sodium: 133 mmol/L — ABNORMAL LOW (ref 135–145)

## 2021-09-26 LAB — CBC WITH DIFFERENTIAL/PLATELET
Abs Immature Granulocytes: 0.35 10*3/uL — ABNORMAL HIGH (ref 0.00–0.07)
Basophils Absolute: 0 10*3/uL (ref 0.0–0.1)
Basophils Relative: 0 %
Eosinophils Absolute: 0 10*3/uL (ref 0.0–0.5)
Eosinophils Relative: 0 %
HCT: 25.2 % — ABNORMAL LOW (ref 36.0–46.0)
Hemoglobin: 8.2 g/dL — ABNORMAL LOW (ref 12.0–15.0)
Immature Granulocytes: 3 %
Lymphocytes Relative: 6 %
Lymphs Abs: 0.9 10*3/uL (ref 0.7–4.0)
MCH: 30.4 pg (ref 26.0–34.0)
MCHC: 32.5 g/dL (ref 30.0–36.0)
MCV: 93.3 fL (ref 80.0–100.0)
Monocytes Absolute: 0.6 10*3/uL (ref 0.1–1.0)
Monocytes Relative: 4 %
Neutro Abs: 12.3 10*3/uL — ABNORMAL HIGH (ref 1.7–7.7)
Neutrophils Relative %: 87 %
Platelets: 197 10*3/uL (ref 150–400)
RBC: 2.7 MIL/uL — ABNORMAL LOW (ref 3.87–5.11)
RDW: 23 % — ABNORMAL HIGH (ref 11.5–15.5)
WBC: 14.2 10*3/uL — ABNORMAL HIGH (ref 4.0–10.5)
nRBC: 0.7 % — ABNORMAL HIGH (ref 0.0–0.2)

## 2021-09-26 LAB — CULTURE, BLOOD (ROUTINE X 2)
Culture: NO GROWTH
Culture: NO GROWTH
Special Requests: ADEQUATE
Special Requests: ADEQUATE

## 2021-09-26 LAB — GLUCOSE, CAPILLARY
Glucose-Capillary: 201 mg/dL — ABNORMAL HIGH (ref 70–99)
Glucose-Capillary: 216 mg/dL — ABNORMAL HIGH (ref 70–99)
Glucose-Capillary: 275 mg/dL — ABNORMAL HIGH (ref 70–99)
Glucose-Capillary: 193 mg/dL — ABNORMAL HIGH (ref 70–99)
Glucose-Capillary: 207 mg/dL — ABNORMAL HIGH (ref 70–99)

## 2021-09-26 LAB — MAGNESIUM: Magnesium: 1.9 mg/dL (ref 1.7–2.4)

## 2021-09-26 MED ORDER — ALBUMIN HUMAN 25 % IV SOLN
25.0000 g | Freq: Once | INTRAVENOUS | Status: AC
  Administered 2021-09-26: 25 g via INTRAVENOUS

## 2021-09-26 NOTE — Procedures (Signed)
    HEMODIALYSIS TREATMENT NOTE:   Extra sequential only UF treatment was performed via left forearm AVF (17g).  She remains on Levophed infusion at 2 mcg/min since yesterday's treatment.    UF was limited by tachycardia--had episodes of Afib with RVR up to 140s which would resolve with interruption in UF.  Also had frequent multifocal PVCs and 5 episodes of  ventricular bigeminy.  She was asymptomatic but UF goal was lowered.  Please see yesterday's treatment note.  Albumin 25g given at tx onset and halfway through.  All blood was returned and hemostasis was achieved in 15 minutes.     Total run time: 3 hours Net UF 2.1 liters  Rockwell Alexandria, RN

## 2021-09-26 NOTE — Progress Notes (Signed)
PROGRESS NOTE    [name]  ZOX:[id number] DOB: Nov 08, 1966 DOA: 09/17/2021 PCP: Noreene Larsson, NP   Brief Narrative:   [name] is a 55 year old female with past medical history significant for essential hypertension, ESRD on HD (MWF), chronic diastolic congestive heart failure, COPD, type 2 diabetes mellitus, chronic lymphedema, morbid obesity who presents to [facility], ED on 9/29 with worsening left thigh infection, generalized weakness/fatigue in the setting of missed dialysis sessions.  Patient been complaining of worsening pain and purulent foul-smelling discharge from her wound site.  She is recommended by her surgeons to have ongoing wound care, but developed septic shock likely related to left thigh wound with associated acute metabolic encephalopathy on 10/3 for which she currently remains on norepinephrine as well as multiple antibiotics.  Assessment & Plan:   Principal Problem:   Open wound of left thigh Active Problems:   Lymphedema of lower extremity   Diabetes mellitus (HCC)   Hyperkalemia   HTN (hypertension)   HLD (hyperlipidemia)   ESRD on dialysis (HCC)   Lymphedema   Obesity, Class III, BMI 40-49.9 (morbid obesity) (HCC)   Severe sepsis with septic shock (HCC)   Hyponatremia   Hypoalbuminemia due to protein-calorie malnutrition (HCC)   Elevated brain natriuretic peptide (BNP) level   Atrial fibrillation, chronic (HCC)   Pulmonary edema   Prolonged QT interval   Elevated alkaline phosphatase in newborn   Septic shock secondary to left thigh wound, not POA -CT left femur pending with possible need to transfer as needed -Continue IV antibiotics as ordered day 6/7 -Continues to require norepinephrine, appreciate general surgery with placement of right femoral line; continue to wean -Addition of hydrocortisone IV 10/5 -Adjustment of midodrine to 20 mg 3 times daily on 81/1   Acute metabolic encephalopathy secondary to above-resolved -Continue  to maintain MAP greater than 60   Acute on chronic diastolic congestive heart failure -Echocardiogram with LVEF 60-65% and grade 1 diastolic dysfunction on 08/20/4781 -Volume management per hemodialysis   ESRD on HD MWF -Nephrology following with hemodialysis performed 10/7; repeat UF today   Hyponatremia -Per nephrology with hemodialysis  Hypokalemia -Per nephrology   Paroxysmal atrial fibrillation -Continues on apixaban -Amiodarone 200 mg p.o. daily   History of chronic hypotension -Continues on Levophed as above, wean as tolerated -Adjusted midodrine to 20 mg 3 times daily   Type 2 diabetes Diet controlled at home with recent hemoglobin A1c 6.9% Levemir 5 units to be added 10/7 due to steroid-induced hyperglycemia   Morbid obesity -Lifestyle changes outpatient   Weakness/fatigue/deconditioning -PT/OT recommending SNF, but refuses   DVT prophylaxis: Eliquis Code Status: Full Family Communication: Discussed with husband on phone 10/6 Disposition Plan:  Status is: Inpatient   Remains inpatient appropriate because:Hemodynamically unstable, IV treatments appropriate due to intensity of illness or inability to take PO, and Inpatient level of care appropriate due to severity of illness   Dispo: The patient is from: Home              Anticipated d/c is to: Home              Patient currently is not medically stable to d/c.              Difficult to place patient No   Nutritional Assessment:   The patient's BMI is: Body mass index is 46.7 kg/m.Marland Kitchen   Seen by dietician.  I agree with the assessment and plan as outlined below:   Nutrition Status:  Skin Assessment:   I have examined the patient's skin and I agree with the wound assessment as performed by the wound care RN as outlined below:   Pressure Injury 07/04/21 Thigh Posterior;Proximal;Right Stage 2 -  Partial thickness loss of dermis presenting as a shallow open injury with a red, pink wound bed without slough.  (Active)  07/04/21 1200  Location: Thigh (left thigh)  Location Orientation: Posterior;Proximal;Right  Staging: Stage 2 -  Partial thickness loss of dermis presenting as a shallow open injury with a red, pink wound bed without slough.  Wound Description (Comments):   Present on Admission: No      Consultants:  Nephrology, Dr. Moshe Cipro Vascular surgery and plastic surgery - discussed with EDP   Procedures:  Sutures to left thigh wound placed by EDP on admission   Antimicrobials:  Vancomycin 10/3>> Cefepime 10/3>> Metronidazole 10/3>>  Subjective: Patient seen and evaluated today with no new acute complaints or concerns. No acute concerns or events noted overnight. She tolerated HD ok yesterday, but was noted to have some brief arrhythmias.  Objective: Vitals:   09/26/21 0015 09/26/21 0030 09/26/21 0402 09/26/21 0720  BP: (!) 101/52 110/62  96/62  Pulse: 91 89  (!) 121  Resp: _0 Temp:   97.7 F (36.5 C) 97.8 F (36.6 C)  TempSrc:   Oral Oral  SpO2: 95% 96%  97%  Weight:   128.1 kg   Height:        Intake/Output Summary (Last 24 hours) at 09/26/2021 1000 Last data filed at 09/26/2021 0040 Gross per 24 hour  Intake 649.31 ml  Output --  Net 649.31 ml   Filed Weights   09/25/21 0455 09/25/21 1445 09/26/21 0402  Weight: 130.7 kg 130.8 kg 128.1 kg    Examination:  General exam: Appears calm and comfortable  Respiratory system: Clear to auscultation. Respiratory effort normal. Cardiovascular system: S1 & S2 heard, RRR.  Gastrointestinal system: Abdomen is soft Central nervous system: Alert and awake Extremities: No edema; L leg wound c/d/I; femoral cath without erythema or drainage Skin: No significant lesions noted Psychiatry: Flat affect.    Data Reviewed: I have personally reviewed following labs and imaging studies  CBC: Recent Labs  Lab 09/22/21 1227 09/23/21 0525 09/24/21 0430 09/25/21 0446 09/26/21 0517  WBC 24.3* 19.4* 19.0* 19.2*  14.2*  NEUTROABS 21.2* 15.9* 17.1* 17.0* 12.3*  HGB 9.3* 9.0* 8.7* 9.1* 8.2*  HCT 28.1* 27.5* 27.1* 27.3* 25.2*  MCV 92.1 91.1 91.9 93.2 93.3  PLT 297 278 245 255 812   Basic Metabolic Panel: Recent Labs  Lab 09/22/21 1227 09/23/21 0525 09/24/21 0430 09/25/21 0446 09/26/21 0517  NA 130* 129* 131* 129* 133*  K 3.2* 3.8 3.5 3.5 3.3*  CL 91* 91* 91* 91* 93*  CO2 _1 GLUCOSE 193* 273* 274* 180* 229*  BUN 49* 56* 34* 51* 35*  CREATININE 5.11* 5.55* 3.65* 4.40* 3.12*  CALCIUM 8.3* 8.1* 8.4* 8.8* 8.8*  MG  --   --  1.9 2.1 1.9  PHOS 4.2 3.9 4.3 4.2 3.8   GFR: Estimated Creatinine Clearance: 27.5 mL/min (A) (by C-G formula based on SCr of 3.12 mg/dL (H)). Liver Function Tests: Recent Labs  Lab 09/22/21 1227 09/23/21 0525 09/24/21 0430 09/25/21 0446 09/26/21 0517  ALBUMIN 3.0* 2.8* 2.7* 2.8* 3.1*   No results for input(s): LIPASE, AMYLASE in the last 168 hours. No results for input(s): AMMONIA in the last 168 hours. Coagulation Profile: Recent  Labs  Lab 09/21/21 1124  INR 4.7*   Cardiac Enzymes: No results for input(s): CKTOTAL, CKMB, CKMBINDEX, TROPONINI in the last 168 hours. BNP (last 3 results) No results for input(s): PROBNP in the last 8760 hours. HbA1C: No results for input(s): HGBA1C in the last 72 hours. CBG: Recent Labs  Lab 09/25/21 1628 09/25/21 2010 09/25/21 2354 09/26/21 0415 09/26/21 0722  GLUCAP 134* 139* 188* 201* 216*   Lipid Profile: No results for input(s): CHOL, HDL, LDLCALC, TRIG, CHOLHDL, LDLDIRECT in the last 72 hours. Thyroid Function Tests: No results for input(s): TSH, T4TOTAL, FREET4, T3FREE, THYROIDAB in the last 72 hours. Anemia Panel: No results for input(s): VITAMINB12, FOLATE, FERRITIN, TIBC, IRON, RETICCTPCT in the last 72 hours. Sepsis Labs: Recent Labs  Lab 09/21/21 6720 09/21/21 1124 09/21/21 1258 09/22/21 1227 09/23/21 0525  PROCALCITON 11.31  --   --  14.88 13.47  LATICACIDVEN  --  2.8* 1.4  --   --      Recent Results (from the past 240 hour(s))  Blood culture (routine x 2)     Status: None   Collection Time: 09/17/21  1:25 PM   Specimen: Right Antecubital; Blood  Result Value Ref Range Status   Specimen Description RIGHT ANTECUBITAL  Final   Special Requests   Final    Blood Culture adequate volume BOTTLES DRAWN AEROBIC AND ANAEROBIC   Culture   Final    NO GROWTH 5 DAYS Performed at [facility], [address] [place], [place] [address]    Report Status 09/22/2021 FINAL  Final  Resp Panel by RT-PCR (Flu A&B, Covid) Nasopharyngeal Swab     Status: None   Collection Time: 09/17/21  1:27 PM   Specimen: Nasopharyngeal Swab; Nasopharyngeal(NP) swabs in vial transport medium  Result Value Ref Range Status   SARS Coronavirus 2 by RT PCR NEGATIVE NEGATIVE Final    Comment: (NOTE) SARS-CoV-2 target nucleic acids are NOT DETECTED.  The SARS-CoV-2 RNA is generally detectable in upper respiratory specimens during the acute phase of infection. The lowest concentration of SARS-CoV-2 viral copies this assay can detect is 138 copies/mL. A negative result does not preclude SARS-Cov-2 infection and should not be used as the sole basis for treatment or other patient management decisions. A negative result may occur with  improper specimen collection/handling, submission of specimen other than nasopharyngeal swab, presence of viral mutation(s) within the areas targeted by this assay, and inadequate number of viral copies(<138 copies/mL). A negative result must be combined with clinical observations, patient history, and epidemiological information. The expected result is Negative.  Fact Sheet for Patients:  EntrepreneurPulse.com.au  Fact Sheet for Healthcare Providers:  IncredibleEmployment.be  This test is no t yet approved or cleared by the [place] FDA and  has been authorized for detection and/or diagnosis of SARS-CoV-2 by FDA under an  Emergency Use Authorization (EUA). This EUA will remain  in effect (meaning this test can be used) for the duration of the COVID-19 declaration under Section 564(b)(1) of the Act, 21 U.S.C.section 360bbb-3(b)(1), unless the authorization is terminated  or revoked sooner.       Influenza A by PCR NEGATIVE NEGATIVE Final   Influenza B by PCR NEGATIVE NEGATIVE Final    Comment: (NOTE) The Xpert Xpress SARS-CoV-2/FLU/RSV plus assay is intended as an aid in the diagnosis of influenza from Nasopharyngeal swab specimens and should not be used as a sole basis for treatment. Nasal washings and aspirates are unacceptable for Xpert Xpress SARS-CoV-2/FLU/RSV testing.  Fact  Sheet for Patients: EntrepreneurPulse.com.au  Fact Sheet for Healthcare Providers: IncredibleEmployment.be  This test is not yet approved or cleared by the [place] FDA and has been authorized for detection and/or diagnosis of SARS-CoV-2 by FDA under an Emergency Use Authorization (EUA). This EUA will remain in effect (meaning this test can be used) for the duration of the COVID-19 declaration under Section 564(b)(1) of the Act, 21 U.S.C. section 360bbb-3(b)(1), unless the authorization is terminated or revoked.  Performed at [facility], [address], [place], [place] [address]   Blood culture (routine x 2)     Status: None   Collection Time: 09/17/21  1:30 PM   Specimen: Right Antecubital; Blood  Result Value Ref Range Status   Specimen Description RIGHT ANTECUBITAL  Final   Special Requests   Final    BOTTLES DRAWN AEROBIC AND ANAEROBIC Blood Culture adequate volume   Culture   Final    NO GROWTH 5 DAYS Performed at [facility], [address] [place], [place] [address]    Report Status 09/22/2021 FINAL  Final  Culture, blood (routine x 2)     Status: None   Collection Time: 09/21/21 11:24 AM   Specimen: Hemodialysis Fistula; Blood  Result Value Ref Range Status    Specimen Description   Final    HEMODIALYSIS FISTULA BOTTLES DRAWN AEROBIC AND ANAEROBIC   Special Requests Blood Culture adequate volume  Final   Culture   Final    NO GROWTH 5 DAYS Performed at [facility], [address], [place], [place] [address]    Report Status 09/26/2021 FINAL  Final  Culture, blood (routine x 2)     Status: None   Collection Time: 09/21/21 11:24 AM   Specimen: Hemodialysis Fistula; Blood  Result Value Ref Range Status   Specimen Description   Final    HEMODIALYSIS FISTULA BOTTLES DRAWN AEROBIC AND ANAEROBIC   Special Requests Blood Culture adequate volume  Final   Culture   Final    NO GROWTH 5 DAYS Performed at [facility], [address] [place], [place] [address]    Report Status 09/26/2021 FINAL  Final  MRSA Next Gen by PCR, Nasal     Status: None   Collection Time: 09/21/21  9:13 PM   Specimen: Nasal Mucosa; Nasal Swab  Result Value Ref Range Status   MRSA by PCR Next Gen NOT DETECTED NOT DETECTED Final    Comment: (NOTE) The GeneXpert MRSA Assay (FDA approved for NASAL specimens only), is one component of a comprehensive MRSA colonization surveillance program. It is not intended to diagnose MRSA infection nor to guide or monitor treatment for MRSA infections. Test performance is not FDA approved in patients less than 68 years old. Performed at [facility], [address], [place], [place] [address]          Radiology Studies: No results found.      Scheduled Meds:  amiodarone  200 mg Oral Daily   apixaban  5 mg Oral BID   calcitRIOL  0.5 mcg Oral Q M,W,F-HD   calcium acetate  1,334 mg Oral TID with meals   Chlorhexidine Gluconate Cloth  6 each Topical Q0600   darbepoetin (ARANESP) injection - DIALYSIS  100 mcg Intravenous Q Fri-HD   feeding supplement  237 mL Oral BID BM   hydrocortisone sod succinate (SOLU-CORTEF) inj  100 mg Intravenous Q8H   influenza vac split quadrivalent PF  0.5 mL Intramuscular Tomorrow-1000   insulin  aspart  0-15 Units  Subcutaneous Q4H   insulin detemir  5 Units Subcutaneous Daily   lanthanum  500 mg Oral TID WC   midodrine  20 mg Oral TID WC   multivitamin  1 tablet Oral QHS   pantoprazole  40 mg Oral BID   sodium chloride flush  10-40 mL Intracatheter Q12H   Continuous Infusions:  sodium chloride     sodium chloride     sodium chloride 10 mL/hr at 09/26/21 0040   ceFEPime (MAXIPIME) IV 2 g (09/25/21 1925)   metronidazole Stopped (09/25/21 2341)   norepinephrine (LEVOPHED) Adult infusion 3 mcg/min (09/26/21 0040)   vancomycin 1,000 mg (09/25/21 1815)     LOS: 9 days    Time spent: 35 minutes    Mackenize Delgadillo Darleen Crocker, DO [facility]  If 7PM-7AM, please contact night-coverage [email or web address] 09/26/2021, 10:00 AM

## 2021-09-27 DIAGNOSIS — S71102D Unspecified open wound, left thigh, subsequent encounter: Secondary | ICD-10-CM | POA: Diagnosis not present

## 2021-09-27 LAB — CBC
HCT: 27.9 % — ABNORMAL LOW (ref 36.0–46.0)
Hemoglobin: 8.9 g/dL — ABNORMAL LOW (ref 12.0–15.0)
MCH: 30.1 pg (ref 26.0–34.0)
MCHC: 31.9 g/dL (ref 30.0–36.0)
MCV: 94.3 fL (ref 80.0–100.0)
Platelets: 199 K/uL (ref 150–400)
RBC: 2.96 MIL/uL — ABNORMAL LOW (ref 3.87–5.11)
RDW: 23 % — ABNORMAL HIGH (ref 11.5–15.5)
WBC: 13.6 K/uL — ABNORMAL HIGH (ref 4.0–10.5)
nRBC: 0.8 % — ABNORMAL HIGH (ref 0.0–0.2)

## 2021-09-27 LAB — GLUCOSE, CAPILLARY
Glucose-Capillary: 163 mg/dL — ABNORMAL HIGH (ref 70–99)
Glucose-Capillary: 178 mg/dL — ABNORMAL HIGH (ref 70–99)
Glucose-Capillary: 200 mg/dL — ABNORMAL HIGH (ref 70–99)
Glucose-Capillary: 206 mg/dL — ABNORMAL HIGH (ref 70–99)
Glucose-Capillary: 208 mg/dL — ABNORMAL HIGH (ref 70–99)
Glucose-Capillary: 229 mg/dL — ABNORMAL HIGH (ref 70–99)

## 2021-09-27 LAB — RENAL FUNCTION PANEL
Albumin: 3.6 g/dL (ref 3.5–5.0)
Anion gap: 11 (ref 5–15)
BUN: 52 mg/dL — ABNORMAL HIGH (ref 6–20)
CO2: 28 mmol/L (ref 22–32)
Calcium: 9.1 mg/dL (ref 8.9–10.3)
Chloride: 92 mmol/L — ABNORMAL LOW (ref 98–111)
Creatinine, Ser: 3.96 mg/dL — ABNORMAL HIGH (ref 0.44–1.00)
GFR, Estimated: 13 mL/min — ABNORMAL LOW (ref >60)
Glucose, Bld: 171 mg/dL — ABNORMAL HIGH (ref 70–99)
Phosphorus: 4.3 mg/dL (ref 2.5–4.6)
Potassium: 3.5 mmol/L (ref 3.5–5.1)
Sodium: 131 mmol/L — ABNORMAL LOW (ref 135–145)

## 2021-09-27 LAB — MAGNESIUM: Magnesium: 2.1 mg/dL (ref 1.7–2.4)

## 2021-09-27 MED ORDER — CHLORHEXIDINE GLUCONATE CLOTH 2 % EX PADS
6.0000 | MEDICATED_PAD | Freq: Every day | CUTANEOUS | Status: DC
  Administered 2021-09-27 – 2021-09-29 (×3): 6 via TOPICAL

## 2021-09-27 MED ORDER — MIDODRINE HCL 5 MG PO TABS
25.0000 mg | ORAL_TABLET | Freq: Three times a day (TID) | ORAL | Status: DC
  Administered 2021-09-27 – 2021-10-02 (×17): 25 mg via ORAL
  Filled 2021-09-27 (×17): qty 5

## 2021-09-27 NOTE — Progress Notes (Signed)
PROGRESS NOTE    [name]  WLN:[id number] DOB: [birth year] DOA: 09/17/2021 PCP: Noreene Larsson, NP   Brief Narrative:   [name] is a 55 year old female with past medical history significant for essential hypertension, ESRD on HD (MWF), chronic diastolic congestive heart failure, COPD, type 2 diabetes mellitus, chronic lymphedema, morbid obesity who presents to [facility], ED on 9/29 with worsening left thigh infection, generalized weakness/fatigue in the setting of missed dialysis sessions.  Patient been complaining of worsening pain and purulent foul-smelling discharge from her wound site.  She is recommended by her surgeons to have ongoing wound care, but developed septic shock likely related to left thigh wound with associated acute metabolic encephalopathy on 10/3 for which she currently remains on norepinephrine as well as multiple antibiotics.  Assessment & Plan:   Principal Problem:   Open wound of left thigh Active Problems:   Lymphedema of lower extremity   Diabetes mellitus (HCC)   Hyperkalemia   HTN (hypertension)   HLD (hyperlipidemia)   ESRD on dialysis (HCC)   Lymphedema   Obesity, Class III, BMI 40-49.9 (morbid obesity) (HCC)   Severe sepsis with septic shock (HCC)   Hyponatremia   Hypoalbuminemia due to protein-calorie malnutrition (HCC)   Elevated brain natriuretic peptide (BNP) level   Atrial fibrillation, chronic (HCC)   Pulmonary edema   Prolonged QT interval   Elevated alkaline phosphatase in newborn   Septic shock secondary to left thigh wound, not POA -CT left femur pending with possible need to transfer as needed -Continue IV antibiotics as ordered day 7/10 -Continues to require norepinephrine, appreciate general surgery with placement of right femoral line; continue to wean -Addition of hydrocortisone IV 10/5 -Adjustment of midodrine to 25 mg 3 times daily on 10/9 -May require IV change on 74/08   Acute metabolic encephalopathy  secondary to above-resolved -Continue to maintain MAP greater than 60   Acute on chronic diastolic congestive heart failure -Echocardiogram with LVEF 60-65% and grade 1 diastolic dysfunction on 12/23/4816 -Volume management per hemodialysis   ESRD on HD MWF -Nephrology following with hemodialysis performed 10/7; repeat UF today   Hyponatremia -Per nephrology with hemodialysis   Paroxysmal atrial fibrillation -Continues on apixaban -Amiodarone 200 mg p.o. daily   History of chronic hypotension -Continues on Levophed as above, wean as tolerated -Adjusted midodrine to 20 mg 3 times daily   Type 2 diabetes Diet controlled at home with recent hemoglobin A1c 6.9% Levemir 5 units to be added 10/7 due to steroid-induced hyperglycemia   Morbid obesity -Lifestyle changes outpatient   Weakness/fatigue/deconditioning -PT/OT recommending SNF, but refuses   DVT prophylaxis: Eliquis Code Status: Full Family Communication: Discussed with husband on phone 10/6 Disposition Plan:  Status is: Inpatient   Remains inpatient appropriate because:Hemodynamically unstable, IV treatments appropriate due to intensity of illness or inability to take PO, and Inpatient level of care appropriate due to severity of illness   Dispo: The patient is from: Home              Anticipated d/c is to: Home              Patient currently is not medically stable to d/c.              Difficult to place patient No   Nutritional Assessment:   The patient's BMI is: Body mass index is 46.7 kg/m.Marland Kitchen   Seen by dietician.  I agree with the assessment and plan as outlined below:   Nutrition  Status:   Skin Assessment:   I have examined the patient's skin and I agree with the wound assessment as performed by the wound care RN as outlined below:   Pressure Injury 07/04/21 Thigh Posterior;Proximal;Right Stage 2 -  Partial thickness loss of dermis presenting as a shallow open injury with a red, pink wound bed without  slough. (Active)  07/04/21 1200  Location: Thigh (left thigh)  Location Orientation: Posterior;Proximal;Right  Staging: Stage 2 -  Partial thickness loss of dermis presenting as a shallow open injury with a red, pink wound bed without slough.  Wound Description (Comments):   Present on Admission: No      Consultants:  Nephrology, Dr. Moshe Cipro Vascular surgery and plastic surgery - discussed with EDP   Procedures:  Sutures to left thigh wound placed by EDP on admission   Antimicrobials:  Vancomycin 10/3>> Cefepime 10/3>> Metronidazole 10/3>>  Subjective: Patient seen and evaluated today with no new acute complaints or concerns. No acute concerns or events noted overnight.  She reports that she is feeling more volume overloaded now and has some increased swelling to her lower extremities.  She continues to remain on norepinephrine.  Objective: Vitals:   09/27/21 0400 09/27/21 0500 09/27/21 0645 09/27/21 0700  BP:   103/75   Pulse:      Resp:   13 13  Temp: 97.9 F (36.6 C)     TempSrc:      SpO2:      Weight:  129.7 kg    Height:        Intake/Output Summary (Last 24 hours) at 09/27/2021 0945 Last data filed at 09/27/2021 0733 Gross per 24 hour  Intake 1662.75 ml  Output 2063 ml  Net -400.25 ml   Filed Weights   09/26/21 0402 09/26/21 1235 09/27/21 0500  Weight: 128.1 kg 128.1 kg 129.7 kg    Examination:  General exam: Appears calm and comfortable  Respiratory system: Clear to auscultation. Respiratory effort normal. Cardiovascular system: S1 & S2 heard, RRR.  Gastrointestinal system: Abdomen is soft Central nervous system: Alert and awake Extremities: No edema, L inner thigh dressing c/d/i Skin: No significant lesions noted Psychiatry: Flat affect.    Data Reviewed: I have personally reviewed following labs and imaging studies  CBC: Recent Labs  Lab 09/22/21 1227 09/23/21 0525 09/24/21 0430 09/25/21 0446 09/26/21 0517 09/27/21 0511  WBC 24.3*  19.4* 19.0* 19.2* 14.2* 13.6*  NEUTROABS 21.2* 15.9* 17.1* 17.0* 12.3*  --   HGB 9.3* 9.0* 8.7* 9.1* 8.2* 8.9*  HCT 28.1* 27.5* 27.1* 27.3* 25.2* 27.9*  MCV 92.1 91.1 91.9 93.2 93.3 94.3  PLT 297 278 245 255 197 355   Basic Metabolic Panel: Recent Labs  Lab 09/23/21 0525 09/24/21 0430 09/25/21 0446 09/26/21 0517 09/27/21 0511  NA 129* 131* 129* 133* 131*  K 3.8 3.5 3.5 3.3* 3.5  CL 91* 91* 91* 93* 92*  CO2 _0 GLUCOSE 273* 274* 180* 229* 171*  BUN 56* 34* 51* 35* 52*  CREATININE 5.55* 3.65* 4.40* 3.12* 3.96*  CALCIUM 8.1* 8.4* 8.8* 8.8* 9.1  MG  --  1.9 2.1 1.9 2.1  PHOS 3.9 4.3 4.2 3.8 4.3   GFR: Estimated Creatinine Clearance: 21.8 mL/min (A) (by C-G formula based on SCr of 3.96 mg/dL (H)). Liver Function Tests: Recent Labs  Lab 09/23/21 0525 09/24/21 0430 09/25/21 0446 09/26/21 0517 09/27/21 0511  ALBUMIN 2.8* 2.7* 2.8* 3.1* 3.6   No results for input(s): LIPASE, AMYLASE in the  last 168 hours. No results for input(s): AMMONIA in the last 168 hours. Coagulation Profile: Recent Labs  Lab 09/21/21 1124  INR 4.7*   Cardiac Enzymes: No results for input(s): CKTOTAL, CKMB, CKMBINDEX, TROPONINI in the last 168 hours. BNP (last 3 results) No results for input(s): PROBNP in the last 8760 hours. HbA1C: No results for input(s): HGBA1C in the last 72 hours. CBG: Recent Labs  Lab 09/26/21 1632 09/26/21 2100 09/27/21 0013 09/27/21 0502 09/27/21 0714  GLUCAP 193* 207* 200* 163* 178*   Lipid Profile: No results for input(s): CHOL, HDL, LDLCALC, TRIG, CHOLHDL, LDLDIRECT in the last 72 hours. Thyroid Function Tests: No results for input(s): TSH, T4TOTAL, FREET4, T3FREE, THYROIDAB in the last 72 hours. Anemia Panel: No results for input(s): VITAMINB12, FOLATE, FERRITIN, TIBC, IRON, RETICCTPCT in the last 72 hours. Sepsis Labs: Recent Labs  Lab 09/21/21 7628 09/21/21 1124 09/21/21 1258 09/22/21 1227 09/23/21 0525  PROCALCITON 11.31  --   --   14.88 13.47  LATICACIDVEN  --  2.8* 1.4  --   --     Recent Results (from the past 240 hour(s))  Blood culture (routine x 2)     Status: None   Collection Time: 09/17/21  1:25 PM   Specimen: Right Antecubital; Blood  Result Value Ref Range Status   Specimen Description RIGHT ANTECUBITAL  Final   Special Requests   Final    Blood Culture adequate volume BOTTLES DRAWN AEROBIC AND ANAEROBIC   Culture   Final    NO GROWTH 5 DAYS Performed at [facility], [address], [place], [place] [address]    Report Status 09/22/2021 FINAL  Final  Resp Panel by RT-PCR (Flu A&B, Covid) Nasopharyngeal Swab     Status: None   Collection Time: 09/17/21  1:27 PM   Specimen: Nasopharyngeal Swab; Nasopharyngeal(NP) swabs in vial transport medium  Result Value Ref Range Status   SARS Coronavirus 2 by RT PCR NEGATIVE NEGATIVE Final    Comment: (NOTE) SARS-CoV-2 target nucleic acids are NOT DETECTED.  The SARS-CoV-2 RNA is generally detectable in upper respiratory specimens during the acute phase of infection. The lowest concentration of SARS-CoV-2 viral copies this assay can detect is 138 copies/mL. A negative result does not preclude SARS-Cov-2 infection and should not be used as the sole basis for treatment or other patient management decisions. A negative result may occur with  improper specimen collection/handling, submission of specimen other than nasopharyngeal swab, presence of viral mutation(s) within the areas targeted by this assay, and inadequate number of viral copies(<138 copies/mL). A negative result must be combined with clinical observations, patient history, and epidemiological information. The expected result is Negative.  Fact Sheet for Patients:  EntrepreneurPulse.com.au  Fact Sheet for Healthcare Providers:  IncredibleEmployment.be  This test is no t yet approved or cleared by the [place] FDA and  has been authorized for detection  and/or diagnosis of SARS-CoV-2 by FDA under an Emergency Use Authorization (EUA). This EUA will remain  in effect (meaning this test can be used) for the duration of the COVID-19 declaration under Section 564(b)(1) of the Act, 21 U.S.C.section 360bbb-3(b)(1), unless the authorization is terminated  or revoked sooner.       Influenza A by PCR NEGATIVE NEGATIVE Final   Influenza B by PCR NEGATIVE NEGATIVE Final    Comment: (NOTE) The Xpert Xpress SARS-CoV-2/FLU/RSV plus assay is intended as an aid in the diagnosis of influenza from Nasopharyngeal swab specimens and should not be used as a sole  basis for treatment. Nasal washings and aspirates are unacceptable for Xpert Xpress SARS-CoV-2/FLU/RSV testing.  Fact Sheet for Patients: EntrepreneurPulse.com.au  Fact Sheet for Healthcare Providers: IncredibleEmployment.be  This test is not yet approved or cleared by the [place] FDA and has been authorized for detection and/or diagnosis of SARS-CoV-2 by FDA under an Emergency Use Authorization (EUA). This EUA will remain in effect (meaning this test can be used) for the duration of the COVID-19 declaration under Section 564(b)(1) of the Act, 21 U.S.C. section 360bbb-3(b)(1), unless the authorization is terminated or revoked.  Performed at [facility], [address], [place], [place] [address]   Blood culture (routine x 2)     Status: None   Collection Time: 09/17/21  1:30 PM   Specimen: Right Antecubital; Blood  Result Value Ref Range Status   Specimen Description RIGHT ANTECUBITAL  Final   Special Requests   Final    BOTTLES DRAWN AEROBIC AND ANAEROBIC Blood Culture adequate volume   Culture   Final    NO GROWTH 5 DAYS Performed at [facility], [address] [place], [place] [address]    Report Status 09/22/2021 FINAL  Final  Culture, blood (routine x 2)     Status: None   Collection Time: 09/21/21 11:24 AM   Specimen: Hemodialysis  Fistula; Blood  Result Value Ref Range Status   Specimen Description   Final    HEMODIALYSIS FISTULA BOTTLES DRAWN AEROBIC AND ANAEROBIC   Special Requests Blood Culture adequate volume  Final   Culture   Final    NO GROWTH 5 DAYS Performed at [facility], [address], [place], [place] [address]    Report Status 09/26/2021 FINAL  Final  Culture, blood (routine x 2)     Status: None   Collection Time: 09/21/21 11:24 AM   Specimen: Hemodialysis Fistula; Blood  Result Value Ref Range Status   Specimen Description   Final    HEMODIALYSIS FISTULA BOTTLES DRAWN AEROBIC AND ANAEROBIC   Special Requests Blood Culture adequate volume  Final   Culture   Final    NO GROWTH 5 DAYS Performed at [facility], [address] [place], [place] [address]    Report Status 09/26/2021 FINAL  Final  MRSA Next Gen by PCR, Nasal     Status: None   Collection Time: 09/21/21  9:13 PM   Specimen: Nasal Mucosa; Nasal Swab  Result Value Ref Range Status   MRSA by PCR Next Gen NOT DETECTED NOT DETECTED Final    Comment: (NOTE) The GeneXpert MRSA Assay (FDA approved for NASAL specimens only), is one component of a comprehensive MRSA colonization surveillance program. It is not intended to diagnose MRSA infection nor to guide or monitor treatment for MRSA infections. Test performance is not FDA approved in patients less than 7 years old. Performed at [facility], [address], [place], [place] [address]          Radiology Studies: No results found.      Scheduled Meds:  amiodarone  200 mg Oral Daily   apixaban  5 mg Oral BID   calcitRIOL  0.5 mcg Oral Q M,W,F-HD   calcium acetate  1,334 mg Oral TID with meals   Chlorhexidine Gluconate Cloth  6 each Topical Daily   darbepoetin (ARANESP) injection - DIALYSIS  100 mcg Intravenous Q Fri-HD   feeding supplement  237 mL Oral BID BM   hydrocortisone sod succinate (SOLU-CORTEF) inj  100 mg Intravenous Q8H   influenza vac  split quadrivalent PF   0.5 mL Intramuscular Tomorrow-1000   insulin aspart  0-15 Units Subcutaneous Q4H   insulin detemir  5 Units Subcutaneous Daily   lanthanum  500 mg Oral TID WC   midodrine  25 mg Oral TID WC   multivitamin  1 tablet Oral QHS   pantoprazole  40 mg Oral BID   sodium chloride flush  10-40 mL Intracatheter Q12H   Continuous Infusions:  sodium chloride     sodium chloride     sodium chloride 10 mL/hr at 09/27/21 0733   ceFEPime (MAXIPIME) IV 2 g (09/25/21 1925)   metronidazole Stopped (09/26/21 2230)   norepinephrine (LEVOPHED) Adult infusion 2 mcg/min (09/27/21 0733)   vancomycin 1,000 mg (09/25/21 1815)     LOS: 10 days    Time spent: 35 minutes    Amorina Doerr Darleen Crocker, DO [facility]  If 7PM-7AM, please contact night-coverage [email or web address] 09/27/2021, 9:45 AM

## 2021-09-28 DIAGNOSIS — S71102D Unspecified open wound, left thigh, subsequent encounter: Secondary | ICD-10-CM | POA: Diagnosis not present

## 2021-09-28 LAB — RENAL FUNCTION PANEL
Albumin: 3.4 g/dL — ABNORMAL LOW (ref 3.5–5.0)
Anion gap: 12 (ref 5–15)
BUN: 70 mg/dL — ABNORMAL HIGH (ref 6–20)
CO2: 27 mmol/L (ref 22–32)
Calcium: 9 mg/dL (ref 8.9–10.3)
Chloride: 91 mmol/L — ABNORMAL LOW (ref 98–111)
Creatinine, Ser: 4.68 mg/dL — ABNORMAL HIGH (ref 0.44–1.00)
GFR, Estimated: 10 mL/min — ABNORMAL LOW (ref >60)
Glucose, Bld: 159 mg/dL — ABNORMAL HIGH (ref 70–99)
Phosphorus: 4.6 mg/dL (ref 2.5–4.6)
Potassium: 3.9 mmol/L (ref 3.5–5.1)
Sodium: 130 mmol/L — ABNORMAL LOW (ref 135–145)

## 2021-09-28 LAB — GLUCOSE, CAPILLARY
Glucose-Capillary: 148 mg/dL — ABNORMAL HIGH (ref 70–99)
Glucose-Capillary: 150 mg/dL — ABNORMAL HIGH (ref 70–99)
Glucose-Capillary: 154 mg/dL — ABNORMAL HIGH (ref 70–99)
Glucose-Capillary: 172 mg/dL — ABNORMAL HIGH (ref 70–99)
Glucose-Capillary: 200 mg/dL — ABNORMAL HIGH (ref 70–99)
Glucose-Capillary: 205 mg/dL — ABNORMAL HIGH (ref 70–99)
Glucose-Capillary: 254 mg/dL — ABNORMAL HIGH (ref 70–99)

## 2021-09-28 LAB — MAGNESIUM: Magnesium: 2.1 mg/dL (ref 1.7–2.4)

## 2021-09-28 MED ORDER — HEPARIN SODIUM (PORCINE) 1000 UNIT/ML IJ SOLN
INTRAMUSCULAR | Status: AC
  Administered 2021-09-28: 3000 [IU] via INTRAVENOUS_CENTRAL
  Filled 2021-09-28: qty 3

## 2021-09-28 NOTE — Progress Notes (Signed)
PROGRESS NOTE    [name]  ZCH:[id number] DOB: [birth year] DOA: 09/17/2021 PCP: Noreene Larsson, NP   Brief Narrative:   [name] is a 55 year old female with past medical history significant for essential hypertension, ESRD on HD (MWF), chronic diastolic congestive heart failure, COPD, type 2 diabetes mellitus, chronic lymphedema, morbid obesity who presents to [facility], ED on 9/29 with worsening left thigh infection, generalized weakness/fatigue in the setting of missed dialysis sessions.  Patient been complaining of worsening pain and purulent foul-smelling discharge from her wound site.  She is recommended by her surgeons to have ongoing wound care, but developed septic shock likely related to left thigh wound with associated acute metabolic encephalopathy on 10/3 for which she currently remains on norepinephrine as well as multiple antibiotics.  Assessment & Plan:   Principal Problem:   Open wound of left thigh Active Problems:   Lymphedema of lower extremity   Diabetes mellitus (HCC)   Hyperkalemia   HTN (hypertension)   HLD (hyperlipidemia)   ESRD on dialysis (HCC)   Lymphedema   Obesity, Class III, BMI 40-49.9 (morbid obesity) (HCC)   Severe sepsis with septic shock (HCC)   Hyponatremia   Hypoalbuminemia due to protein-calorie malnutrition (HCC)   Elevated brain natriuretic peptide (BNP) level   Atrial fibrillation, chronic (HCC)   Pulmonary edema   Prolonged QT interval   Elevated alkaline phosphatase in newborn   Septic shock secondary to left thigh wound, not POA -CT left femur pending with possible need to transfer as needed -Continue IV antibiotics as ordered day 8/10 -Continues to require norepinephrine, appreciate general surgery with placement of right femoral line; continue to wean -Addition of hydrocortisone IV 10/5 -Adjustment of midodrine to 25 mg 3 times daily on 10/9 -Hopefully norepinephrine can be weaned off and IV catheter can be  removed today   Acute metabolic encephalopathy secondary to above-resolved -Continue to maintain MAP greater than 60   Acute on chronic diastolic congestive heart failure -Echocardiogram with LVEF 60-65% and grade 1 diastolic dysfunction on 01/27/7866 -Volume management per hemodialysis   ESRD on HD MWF -Nephrology following with hemodialysis performed 10/7; repeat hemodialysis today   Hyponatremia -Per nephrology with hemodialysis   Paroxysmal atrial fibrillation -Continues on apixaban -Amiodarone 200 mg p.o. daily   History of chronic hypotension -Continues on Levophed as above, wean as tolerated -Adjusted midodrine to 25 mg 3 times daily   Type 2 diabetes Diet controlled at home with recent hemoglobin A1c 6.9% Levemir 5 units to be added 10/7 due to steroid-induced hyperglycemia   Morbid obesity -Lifestyle changes outpatient   Weakness/fatigue/deconditioning -PT/OT recommending SNF, but refuses   DVT prophylaxis: Eliquis Code Status: Full Family Communication: Discussed with husband on phone 10/10 Disposition Plan:  Status is: Inpatient   Remains inpatient appropriate because:Hemodynamically unstable, IV treatments appropriate due to intensity of illness or inability to take PO, and Inpatient level of care appropriate due to severity of illness   Dispo: The patient is from: Home              Anticipated d/c is to: Home              Patient currently is not medically stable to d/c.              Difficult to place patient No   Nutritional Assessment:   The patient's BMI is: Body mass index is 46.7 kg/m.Marland Kitchen   Seen by dietician.  I agree with the assessment and  plan as outlined below:   Nutrition Status:   Skin Assessment:   I have examined the patient's skin and I agree with the wound assessment as performed by the wound care RN as outlined below:   Pressure Injury 07/04/21 Thigh Posterior;Proximal;Right Stage 2 -  Partial thickness loss of dermis presenting as  a shallow open injury with a red, pink wound bed without slough. (Active)  07/04/21 1200  Location: Thigh (left thigh)  Location Orientation: Posterior;Proximal;Right  Staging: Stage 2 -  Partial thickness loss of dermis presenting as a shallow open injury with a red, pink wound bed without slough.  Wound Description (Comments):   Present on Admission: No      Consultants:  Nephrology, Dr. Moshe Cipro Vascular surgery and plastic surgery - discussed with EDP   Procedures:  Sutures to left thigh wound placed by EDP on admission   Antimicrobials:  Vancomycin 10/3>> Cefepime 10/3>> Metronidazole 10/3>>   Subjective: Patient seen and evaluated today with no new acute complaints or concerns. No acute concerns or events noted overnight.  She continues to remain on low-dose of norepinephrine.  Objective: Vitals:   09/28/21 1015 09/28/21 1030 09/28/21 1045 09/28/21 1100  BP: 113/76 108/73 110/72   Pulse: 80 80    Resp: 14 16    Temp:    97.7 F (36.5 C)  TempSrc:    Oral  SpO2: 98% 98%    Weight:      Height:        Intake/Output Summary (Last 24 hours) at 09/28/2021 1138 Last data filed at 09/28/2021 1033 Gross per 24 hour  Intake 746.45 ml  Output --  Net 746.45 ml   Filed Weights   09/27/21 0500 09/28/21 0432 09/28/21 0930  Weight: 129.7 kg 131.2 kg 131 kg    Examination:  General exam: Appears calm and comfortable  Respiratory system: Clear to auscultation. Respiratory effort normal.  On nasal cannula. Cardiovascular system: S1 & S2 heard, RRR.  Gastrointestinal system: Abdomen is soft Central nervous system: Alert and awake Extremities: No edema, left thigh wound clean dry and intact Skin: No significant lesions noted Psychiatry: Flat affect.    Data Reviewed: I have personally reviewed following labs and imaging studies  CBC: Recent Labs  Lab 09/22/21 1227 09/23/21 0525 09/24/21 0430 09/25/21 0446 09/26/21 0517 09/27/21 0511  WBC 24.3* 19.4*  19.0* 19.2* 14.2* 13.6*  NEUTROABS 21.2* 15.9* 17.1* 17.0* 12.3*  --   HGB 9.3* 9.0* 8.7* 9.1* 8.2* 8.9*  HCT 28.1* 27.5* 27.1* 27.3* 25.2* 27.9*  MCV 92.1 91.1 91.9 93.2 93.3 94.3  PLT 297 278 245 255 197 782   Basic Metabolic Panel: Recent Labs  Lab 09/24/21 0430 09/25/21 0446 09/26/21 0517 09/27/21 0511 09/28/21 0430  NA 131* 129* 133* 131* 130*  K 3.5 3.5 3.3* 3.5 3.9  CL 91* 91* 93* 92* 91*  CO2 _0 GLUCOSE 274* 180* 229* 171* 159*  BUN 34* 51* 35* 52* 70*  CREATININE 3.65* 4.40* 3.12* 3.96* 4.68*  CALCIUM 8.4* 8.8* 8.8* 9.1 9.0  MG 1.9 2.1 1.9 2.1 2.1  PHOS 4.3 4.2 3.8 4.3 4.6   GFR: Estimated Creatinine Clearance: 18.6 mL/min (A) (by C-G formula based on SCr of 4.68 mg/dL (H)). Liver Function Tests: Recent Labs  Lab 09/24/21 0430 09/25/21 0446 09/26/21 0517 09/27/21 0511 09/28/21 0430  ALBUMIN 2.7* 2.8* 3.1* 3.6 3.4*   No results for input(s): LIPASE, AMYLASE in the last 168 hours. No results for input(s): AMMONIA  in the last 168 hours. Coagulation Profile: No results for input(s): INR, PROTIME in the last 168 hours. Cardiac Enzymes: No results for input(s): CKTOTAL, CKMB, CKMBINDEX, TROPONINI in the last 168 hours. BNP (last 3 results) No results for input(s): PROBNP in the last 8760 hours. HbA1C: No results for input(s): HGBA1C in the last 72 hours. CBG: Recent Labs  Lab 09/27/21 1627 09/27/21 2005 09/28/21 0011 09/28/21 0421 09/28/21 0731  GLUCAP 208* 206* 172* 150* 148*   Lipid Profile: No results for input(s): CHOL, HDL, LDLCALC, TRIG, CHOLHDL, LDLDIRECT in the last 72 hours. Thyroid Function Tests: No results for input(s): TSH, T4TOTAL, FREET4, T3FREE, THYROIDAB in the last 72 hours. Anemia Panel: No results for input(s): VITAMINB12, FOLATE, FERRITIN, TIBC, IRON, RETICCTPCT in the last 72 hours. Sepsis Labs: Recent Labs  Lab 09/21/21 1258 09/22/21 1227 09/23/21 0525  PROCALCITON  --  14.88 13.47  LATICACIDVEN 1.4  --   --      Recent Results (from the past 240 hour(s))  Culture, blood (routine x 2)     Status: None   Collection Time: 09/21/21 11:24 AM   Specimen: Hemodialysis Fistula; Blood  Result Value Ref Range Status   Specimen Description   Final    HEMODIALYSIS FISTULA BOTTLES DRAWN AEROBIC AND ANAEROBIC   Special Requests Blood Culture adequate volume  Final   Culture   Final    NO GROWTH 5 DAYS Performed at [facility], [address] [place], [place] [address]    Report Status 09/26/2021 FINAL  Final  Culture, blood (routine x 2)     Status: None   Collection Time: 09/21/21 11:24 AM   Specimen: Hemodialysis Fistula; Blood  Result Value Ref Range Status   Specimen Description   Final    HEMODIALYSIS FISTULA BOTTLES DRAWN AEROBIC AND ANAEROBIC   Special Requests Blood Culture adequate volume  Final   Culture   Final    NO GROWTH 5 DAYS Performed at [facility], [address], [place], [place] [address]    Report Status 09/26/2021 FINAL  Final  MRSA Next Gen by PCR, Nasal     Status: None   Collection Time: 09/21/21  9:13 PM   Specimen: Nasal Mucosa; Nasal Swab  Result Value Ref Range Status   MRSA by PCR Next Gen NOT DETECTED NOT DETECTED Final    Comment: (NOTE) The GeneXpert MRSA Assay (FDA approved for NASAL specimens only), is one component of a comprehensive MRSA colonization surveillance program. It is not intended to diagnose MRSA infection nor to guide or monitor treatment for MRSA infections. Test performance is not FDA approved in patients less than 39 years old. Performed at [facility], [address] [place], [place] [address]          Radiology Studies: No results found.      Scheduled Meds:  amiodarone  200 mg Oral Daily   apixaban  5 mg Oral BID   calcitRIOL  0.5 mcg Oral Q M,W,F-HD   calcium acetate  1,334 mg Oral TID with meals   Chlorhexidine Gluconate Cloth  6 each Topical Daily   darbepoetin (ARANESP) injection - DIALYSIS  100 mcg Intravenous Q  Fri-HD   feeding supplement  237 mL Oral BID BM   hydrocortisone sod succinate (SOLU-CORTEF) inj  100 mg Intravenous Q8H   influenza vac split quadrivalent PF  0.5 mL Intramuscular Tomorrow-1000   insulin aspart  0-15 Units Subcutaneous Q4H   insulin detemir  5 Units Subcutaneous Daily  lanthanum  500 mg Oral TID WC   midodrine  25 mg Oral TID WC   multivitamin  1 tablet Oral QHS   pantoprazole  40 mg Oral BID   sodium chloride flush  10-40 mL Intracatheter Q12H   Continuous Infusions:  sodium chloride     sodium chloride     sodium chloride 10 mL/hr at 09/27/21 1754   ceFEPime (MAXIPIME) IV 2 g (09/25/21 1925)   norepinephrine (LEVOPHED) Adult infusion 1 mcg/min (09/27/21 1754)   vancomycin 1,000 mg (09/25/21 1815)     LOS: 11 days    Time spent: 35 minutes    Oluwaseyi Raffel Darleen Crocker, DO [facility]  If 7PM-7AM, please contact night-coverage [email or web address] 09/28/2021, 11:38 AM

## 2021-09-28 NOTE — Progress Notes (Signed)
Subjective:   HD Fri and Sat-  total removed 5.3 liters-  planning for HD later today.  Is alert -  says levophed has been off all night   Objective Vital signs in last 24 hours: Vitals:   09/28/21 0730 09/28/21 0745 09/28/21 0800 09/28/21 0815  BP: 105/69 (!) 143/117 111/78 113/73  Pulse: (!) 104 95  95  Resp: _0 Temp:      TempSrc:      SpO2: 96% 100%  99%  Weight:      Height:       Weight change: 3.1 kg  Intake/Output Summary (Last 24 hours) at 09/28/2021 0838 Last data filed at 09/28/2021 0500 Gross per 24 hour  Intake 825.02 ml  Output --  Net 825.02 ml    Dialyzes at [place] 122.5/  4 hours and 15 minutes/AVF-  15 gauge/1K bath/2.5 calc/BFR 300. Calcitriol 0.5 q treatment -  no ESA-  Friday PTA left out at 121.8-  heparin 1000 bolus and q hour    Assessment/Plan: 55 year old BF with ESRD, many other problems including lymphedema and chronic thigh wound who presents to hospital with increased thigh wound pain/nausea and missed HD  1 thigh wound-  per primary-  local wound care planned -  developed sepsis so started on vanc and cefepime and now flagyl-  cultures neg but responded well clinically  2 ESRD: normally MWF via AVF-  had not had treatment since 9/23 PTA.   Received HD on 9/30 ,  10/3, 10/5. 10/7 with extra tx 10/8-   Next treatment planned for today 3 Hypertension: is overloaded but hypotensive -  midodrine and albumin with UF as able.  PLEASE do not bolus with NS unless in distress as is overloaded demonstrated by low sodium 4. Anemia of ESRD: not on ESA as OP.   Hgb 9-  have added 100 q Friday-  hgb dropping 5. Metabolic Bone Disease: continue calcitriol 0.5 TIW-  phos is good on fosrenol and phoslo 6. Hyperkalemia -  seems to be a chronic issue-  on 1 K bath as OP-  given lokelma and  1 k bath 9/30 -  has maintained here,  likely has a lower K diet here-  2 K diet for now  [place]: Basic Metabolic  Panel: Recent Labs  Lab 09/26/21 0517 09/27/21 0511 09/28/21 0430  NA 133* 131* 130*  K 3.3* 3.5 3.9  CL 93* 92* 91*  CO2 _1 GLUCOSE 229* 171* 159*  BUN 35* 52* 70*  CREATININE 3.12* 3.96* 4.68*  CALCIUM 8.8* 9.1 9.0  PHOS 3.8 4.3 4.6   Liver Function Tests: Recent Labs  Lab 09/26/21 0517 09/27/21 0511 09/28/21 0430  ALBUMIN 3.1* 3.6 3.4*   No results for input(s): LIPASE, AMYLASE in the last 168 hours. No results for input(s): AMMONIA in the last 168 hours. CBC: Recent Labs  Lab 09/23/21 0525 09/24/21 0430 09/25/21 0446 09/26/21 0517 09/27/21 0511  WBC 19.4* 19.0* 19.2* 14.2* 13.6*  NEUTROABS 15.9* 17.1* 17.0* 12.3*  --   HGB 9.0* 8.7* 9.1* 8.2* 8.9*  HCT 27.5* 27.1* 27.3* 25.2* 27.9*  MCV 91.1 91.9 93.2 93.3 94.3  PLT 278 245 255 197 199   Cardiac Enzymes: No results for input(s): CKTOTAL, CKMB, CKMBINDEX, TROPONINI in the last 168 hours. CBG: Recent Labs  Lab 09/27/21 1627 09/27/21 2005 09/28/21 0011 09/28/21 0421 09/28/21 4503  GLUCAP 208* 206* 172* 150* 148*    Iron Studies: No results for input(s): IRON, TIBC, TRANSFERRIN, FERRITIN in the last 72 hours. Studies/Results: No results found. Medications: Infusions:  sodium chloride     sodium chloride     sodium chloride 10 mL/hr at 09/27/21 1754   ceFEPime (MAXIPIME) IV 2 g (09/25/21 1925)   norepinephrine (LEVOPHED) Adult infusion 1 mcg/min (09/27/21 1754)   vancomycin 1,000 mg (09/25/21 1815)    Scheduled Medications:  amiodarone  200 mg Oral Daily   apixaban  5 mg Oral BID   calcitRIOL  0.5 mcg Oral Q M,W,F-HD   calcium acetate  1,334 mg Oral TID with meals   Chlorhexidine Gluconate Cloth  6 each Topical Daily   darbepoetin (ARANESP) injection - DIALYSIS  100 mcg Intravenous Q Fri-HD   feeding supplement  237 mL Oral BID BM   hydrocortisone sod succinate (SOLU-CORTEF) inj  100 mg Intravenous Q8H   influenza vac split quadrivalent PF  0.5 mL Intramuscular Tomorrow-1000   insulin  aspart  0-15 Units Subcutaneous Q4H   insulin detemir  5 Units Subcutaneous Daily   lanthanum  500 mg Oral TID WC   midodrine  25 mg Oral TID WC   multivitamin  1 tablet Oral QHS   pantoprazole  40 mg Oral BID   sodium chloride flush  10-40 mL Intracatheter Q12H    have reviewed scheduled and prn medications.  Physical Exam: General:  more alert and interactive  Heart: tachy Lungs: decreased effort Abdomen:  obese, soft, non tender Extremities: pitting edema-  left thigh bandaged Dialysis Access: AVF-  good thrill and bruit     09/28/2021,8:38 AM  LOS: 11 days

## 2021-09-29 DIAGNOSIS — S71102D Unspecified open wound, left thigh, subsequent encounter: Secondary | ICD-10-CM | POA: Diagnosis not present

## 2021-09-29 LAB — RENAL FUNCTION PANEL
Albumin: 3.3 g/dL — ABNORMAL LOW (ref 3.5–5.0)
Anion gap: 11 (ref 5–15)
BUN: 50 mg/dL — ABNORMAL HIGH (ref 6–20)
CO2: 28 mmol/L (ref 22–32)
Calcium: 8.9 mg/dL (ref 8.9–10.3)
Chloride: 93 mmol/L — ABNORMAL LOW (ref 98–111)
Creatinine, Ser: 3.63 mg/dL — ABNORMAL HIGH (ref 0.44–1.00)
GFR, Estimated: 14 mL/min — ABNORMAL LOW (ref >60)
Glucose, Bld: 192 mg/dL — ABNORMAL HIGH (ref 70–99)
Phosphorus: 3.8 mg/dL (ref 2.5–4.6)
Potassium: 4.1 mmol/L (ref 3.5–5.1)
Sodium: 132 mmol/L — ABNORMAL LOW (ref 135–145)

## 2021-09-29 LAB — CBC
HCT: 29.5 % — ABNORMAL LOW (ref 36.0–46.0)
Hemoglobin: 9.5 g/dL — ABNORMAL LOW (ref 12.0–15.0)
MCH: 31.4 pg (ref 26.0–34.0)
MCHC: 32.2 g/dL (ref 30.0–36.0)
MCV: 97.4 fL (ref 80.0–100.0)
Platelets: 254 10*3/uL (ref 150–400)
RBC: 3.03 MIL/uL — ABNORMAL LOW (ref 3.87–5.11)
RDW: 24.6 % — ABNORMAL HIGH (ref 11.5–15.5)
WBC: 15.4 10*3/uL — ABNORMAL HIGH (ref 4.0–10.5)
nRBC: 1.5 % — ABNORMAL HIGH (ref 0.0–0.2)

## 2021-09-29 LAB — GLUCOSE, CAPILLARY
Glucose-Capillary: 185 mg/dL — ABNORMAL HIGH (ref 70–99)
Glucose-Capillary: 178 mg/dL — ABNORMAL HIGH (ref 70–99)
Glucose-Capillary: 233 mg/dL — ABNORMAL HIGH (ref 70–99)
Glucose-Capillary: 215 mg/dL — ABNORMAL HIGH (ref 70–99)
Glucose-Capillary: 192 mg/dL — ABNORMAL HIGH (ref 70–99)
Glucose-Capillary: 164 mg/dL — ABNORMAL HIGH (ref 70–99)

## 2021-09-29 LAB — MAGNESIUM: Magnesium: 2 mg/dL (ref 1.7–2.4)

## 2021-09-29 LAB — PROTIME-INR
INR: 2.4 — ABNORMAL HIGH (ref 0.8–1.2)
Prothrombin Time: 26.5 seconds — ABNORMAL HIGH (ref 11.4–15.2)

## 2021-09-29 MED ORDER — VANCOMYCIN HCL IN DEXTROSE 1-5 GM/200ML-% IV SOLN
1000.0000 mg | INTRAVENOUS | Status: AC
  Administered 2021-09-30: 1000 mg via INTRAVENOUS
  Filled 2021-09-29: qty 200

## 2021-09-29 MED ORDER — SODIUM CHLORIDE 0.9 % IV SOLN
2.0000 g | INTRAVENOUS | Status: AC
  Administered 2021-09-30: 2 g via INTRAVENOUS
  Filled 2021-09-29: qty 2

## 2021-09-29 MED ORDER — CHLORHEXIDINE GLUCONATE CLOTH 2 % EX PADS
6.0000 | MEDICATED_PAD | Freq: Every day | CUTANEOUS | Status: DC
  Administered 2021-09-29 – 2021-10-02 (×5): 6 via TOPICAL

## 2021-09-29 NOTE — TOC Progression Note (Signed)
Transition of Care [facility]) - Progression Note    Patient Details  Name: WILLIETTE LOEWE MRN: [id number] Date of Birth: 1966/10/23  Transition of Care [facility] CM/SW Contact  Boneta Lucks, RN Phone Number: 09/29/2021, 11:34 AM  Clinical Narrative:   [facility] called to say they are active with patient for Home health.  TOC called Randall Hiss with [facility] to cancel to referral.   Expected Discharge Plan: [place] Barriers to Discharge: Continued Medical Work up  Expected Discharge Plan and Services Expected Discharge Plan: [place] In-house Referral: Clinical Social Work Discharge Planning Services: CM Consult Post Acute Care Choice: [place] arrangements for the past 2 months: Single Family Home                   Readmission Risk Interventions Readmission Risk Prevention Plan 09/18/2021 04/30/2019  Transportation Screening Complete Complete  Medication Review Press photographer) Complete Complete  PCP or Specialist appointment within 3-5 days of discharge - Complete  HRI or Home Care Consult Complete Complete  SW Recovery Care/Counseling Consult Complete Complete  Palliative Care Screening Not Applicable Not [place] Complete Not Applicable  Some recent data might be hidden

## 2021-09-29 NOTE — Progress Notes (Signed)
PROGRESS NOTE    [name]  GPQ:[id number] DOB: 05-13-1966 DOA: 09/17/2021 PCP: Lindsay Larsson, NP   Brief Narrative:   [name] is a 55 year old female with past medical history significant for essential hypertension, ESRD on HD (MWF), chronic diastolic congestive heart failure, COPD, type 2 diabetes mellitus, chronic lymphedema, morbid obesity who presents to [facility], ED on 9/29 with worsening left thigh infection, generalized weakness/fatigue in the setting of missed dialysis sessions.  Patient been complaining of worsening pain and purulent foul-smelling discharge from her wound site.  She is recommended by her surgeons to have ongoing wound care, but developed septic shock likely related to left thigh wound with associated acute metabolic encephalopathy on 10/3 for which she currently remains on norepinephrine as well as multiple antibiotics.  Her antibiotics will end on 10/12.  Once blood pressures remained stable even on hemodialysis, femoral catheter can be removed.  Assessment & Plan:   Principal Problem:   Open wound of left thigh Active Problems:   Lymphedema of lower extremity   Diabetes mellitus (HCC)   Hyperkalemia   HTN (hypertension)   HLD (hyperlipidemia)   ESRD on dialysis (HCC)   Lymphedema   Obesity, Class III, BMI 40-49.9 (morbid obesity) (HCC)   Severe sepsis with septic shock (HCC)   Hyponatremia   Hypoalbuminemia due to protein-calorie malnutrition (HCC)   Elevated brain natriuretic peptide (BNP) level   Atrial fibrillation, chronic (HCC)   Pulmonary edema   Prolonged QT interval   Elevated alkaline phosphatase in newborn   Septic shock secondary to left thigh wound, not POA -She will need follow-up outpatient with Plastic Surgery, Dr. Marla Roe for left thigh wound reevaluation as soon as possible after discharge -Continue IV antibiotics as ordered day 9/10 -Addition of hydrocortisone IV 10/5 -Adjustment of midodrine to 25 mg 3 times  daily on 10/9 -Hopefully patient will not require any further norepinephrine after today, but we will have to see how hemodialysis goes.  May remove femoral catheter if blood pressure remains stable.  She will require 30 minutes of pressure to the area to prevent hematoma following catheter removal given the fact that she is on Eliquis.   Acute metabolic encephalopathy secondary to above-resolved -Continue to maintain MAP greater than 60   Acute on chronic diastolic congestive heart failure -Echocardiogram with LVEF 60-65% and grade 1 diastolic dysfunction on 0/08/4075 -Volume management per hemodialysis   ESRD on HD MWF -Nephrology following with hemodialysis performed 10/10   Hyponatremia -Per nephrology with hemodialysis   Paroxysmal atrial fibrillation -Continues on apixaban -Amiodarone 200 mg p.o. daily   History of chronic hypotension -Continues on Levophed as above, wean as tolerated -Adjusted midodrine to 25 mg 3 times daily on 10/9   Type 2 diabetes Diet controlled at home with recent hemoglobin A1c 6.9% Levemir 5 units to be added 10/7 due to steroid-induced hyperglycemia   Morbid obesity -Lifestyle changes outpatient   Weakness/fatigue/deconditioning -PT/OT recommending SNF, but refuses   DVT prophylaxis: Eliquis Code Status: Full Family Communication: Discussed with husband on phone 10/10 Disposition Plan:  Status is: Inpatient   Remains inpatient appropriate because:Hemodynamically unstable, IV treatments appropriate due to intensity of illness or inability to take PO, and Inpatient level of care appropriate due to severity of illness   Dispo: The patient is from: Home              Anticipated d/c is to: Home  Patient currently is not medically stable to d/c.              Difficult to place patient No   Nutritional Assessment:   The patient's BMI is: Body mass index is 46.7 kg/m.Marland Kitchen   Seen by dietician.  I agree with the assessment and plan as  outlined below:   Nutrition Status:   Skin Assessment:   I have examined the patient's skin and I agree with the wound assessment as performed by the wound care RN as outlined below:   Pressure Injury 07/04/21 Thigh Posterior;Proximal;Right Stage 2 -  Partial thickness loss of dermis presenting as a shallow open injury with a red, pink wound bed without slough. (Active)  07/04/21 1200  Location: Thigh (left thigh)  Location Orientation: Posterior;Proximal;Right  Staging: Stage 2 -  Partial thickness loss of dermis presenting as a shallow open injury with a red, pink wound bed without slough.  Wound Description (Comments):   Present on Admission: No      Consultants:  Nephrology, Dr. Moshe Cipro Vascular surgery and plastic surgery - discussed with EDP   Procedures:  Sutures to left thigh wound placed by EDP on admission   Antimicrobials:  Vancomycin 10/3>> Cefepime 10/3>> Metronidazole 10/3>>  Subjective: Patient seen and evaluated today with no new acute complaints or concerns. No acute concerns or events noted overnight.  She has remained off norepinephrine with stable blood pressure readings since 930 this morning.  Objective: Vitals:   09/29/21 1000 09/29/21 1015 09/29/21 1100 09/29/21 1114  BP: (!) 84/60 (!) 85/55 107/63   Pulse: 98 97    Resp: _0 Temp:    (!) 97.5 F (36.4 C)  TempSrc:    Oral  SpO2: 90% 90%    Weight:      Height:        Intake/Output Summary (Last 24 hours) at 09/29/2021 1256 Last data filed at 09/29/2021 1113 Gross per 24 hour  Intake 1113.05 ml  Output 4000 ml  Net -2886.95 ml   Filed Weights   09/28/21 0930 09/28/21 1416 09/29/21 0416  Weight: 131 kg 127.5 kg 130.3 kg    Examination:  General exam: Appears calm and comfortable, obese Respiratory system: Clear to auscultation. Respiratory effort normal. Cardiovascular system: S1 & S2 heard, RRR.  Gastrointestinal system: Abdomen is soft Central nervous system: Alert  and awake Extremities: No edema, left thigh wound clean dry and intact.  Right thigh femoral catheter clean dry and intact. Skin: No significant lesions noted Psychiatry: Flat affect.    Data Reviewed: I have personally reviewed following labs and imaging studies  CBC: Recent Labs  Lab 09/23/21 0525 09/24/21 0430 09/25/21 0446 09/26/21 0517 09/27/21 0511 09/29/21 0431  WBC 19.4* 19.0* 19.2* 14.2* 13.6* 15.4*  NEUTROABS 15.9* 17.1* 17.0* 12.3*  --   --   HGB 9.0* 8.7* 9.1* 8.2* 8.9* 9.5*  HCT 27.5* 27.1* 27.3* 25.2* 27.9* 29.5*  MCV 91.1 91.9 93.2 93.3 94.3 97.4  PLT 278 245 255 197 199 572   Basic Metabolic Panel: Recent Labs  Lab 09/25/21 0446 09/26/21 0517 09/27/21 0511 09/28/21 0430 09/29/21 0431  NA 129* 133* 131* 130* 132*  K 3.5 3.3* 3.5 3.9 4.1  CL 91* 93* 92* 91* 93*  CO2 _1 GLUCOSE 180* 229* 171* 159* 192*  BUN 51* 35* 52* 70* 50*  CREATININE 4.40* 3.12* 3.96* 4.68* 3.63*  CALCIUM 8.8* 8.8* 9.1 9.0 8.9  MG 2.1 1.9 2.1  2.1 2.0  PHOS 4.2 3.8 4.3 4.6 3.8   GFR: Estimated Creatinine Clearance: 23.9 mL/min (A) (by C-G formula based on SCr of 3.63 mg/dL (H)). Liver Function Tests: Recent Labs  Lab 09/25/21 0446 09/26/21 0517 09/27/21 0511 09/28/21 0430 09/29/21 0431  ALBUMIN 2.8* 3.1* 3.6 3.4* 3.3*   No results for input(s): LIPASE, AMYLASE in the last 168 hours. No results for input(s): AMMONIA in the last 168 hours. Coagulation Profile: Recent Labs  Lab 09/29/21 1126  INR 2.4*   Cardiac Enzymes: No results for input(s): CKTOTAL, CKMB, CKMBINDEX, TROPONINI in the last 168 hours. BNP (last 3 results) No results for input(s): PROBNP in the last 8760 hours. HbA1C: No results for input(s): HGBA1C in the last 72 hours. CBG: Recent Labs  Lab 09/28/21 1925 09/28/21 2306 09/29/21 0412 09/29/21 0731 09/29/21 1115  GLUCAP 254* 200* 185* 178* 233*   Lipid Profile: No results for input(s): CHOL, HDL, LDLCALC, TRIG, CHOLHDL,  LDLDIRECT in the last 72 hours. Thyroid Function Tests: No results for input(s): TSH, T4TOTAL, FREET4, T3FREE, THYROIDAB in the last 72 hours. Anemia Panel: No results for input(s): VITAMINB12, FOLATE, FERRITIN, TIBC, IRON, RETICCTPCT in the last 72 hours. Sepsis Labs: Recent Labs  Lab 09/23/21 0525  PROCALCITON 13.47    Recent Results (from the past 240 hour(s))  Culture, blood (routine x 2)     Status: None   Collection Time: 09/21/21 11:24 AM   Specimen: Hemodialysis Fistula; Blood  Result Value Ref Range Status   Specimen Description   Final    HEMODIALYSIS FISTULA BOTTLES DRAWN AEROBIC AND ANAEROBIC   Special Requests Blood Culture adequate volume  Final   Culture   Final    NO GROWTH 5 DAYS Performed at [facility], [address], [place], [place] [address]    Report Status 09/26/2021 FINAL  Final  Culture, blood (routine x 2)     Status: None   Collection Time: 09/21/21 11:24 AM   Specimen: Hemodialysis Fistula; Blood  Result Value Ref Range Status   Specimen Description   Final    HEMODIALYSIS FISTULA BOTTLES DRAWN AEROBIC AND ANAEROBIC   Special Requests Blood Culture adequate volume  Final   Culture   Final    NO GROWTH 5 DAYS Performed at [facility], [address], [place], [place] [address]    Report Status 09/26/2021 FINAL  Final  MRSA Next Gen by PCR, Nasal     Status: None   Collection Time: 09/21/21  9:13 PM   Specimen: Nasal Mucosa; Nasal Swab  Result Value Ref Range Status   MRSA by PCR Next Gen NOT DETECTED NOT DETECTED Final    Comment: (NOTE) The GeneXpert MRSA Assay (FDA approved for NASAL specimens only), is one component of a comprehensive MRSA colonization surveillance program. It is not intended to diagnose MRSA infection nor to guide or monitor treatment for MRSA infections. Test performance is not FDA approved in patients less than 62 years old. Performed at [facility], [address] [place], [place] [address]           Radiology Studies: No results found.      Scheduled Meds:  amiodarone  200 mg Oral Daily   apixaban  5 mg Oral BID   calcitRIOL  0.5 mcg Oral Q M,W,F-HD   calcium acetate  1,334 mg Oral TID with meals   Chlorhexidine Gluconate Cloth  6 each Topical Daily   Chlorhexidine Gluconate Cloth  6 each Topical Q0600   darbepoetin (ARANESP)  injection - DIALYSIS  100 mcg Intravenous Q Fri-HD   feeding supplement  237 mL Oral BID BM   hydrocortisone sod succinate (SOLU-CORTEF) inj  100 mg Intravenous Q8H   influenza vac split quadrivalent PF  0.5 mL Intramuscular Tomorrow-1000   insulin aspart  0-15 Units Subcutaneous Q4H   insulin detemir  5 Units Subcutaneous Daily   lanthanum  500 mg Oral TID WC   midodrine  25 mg Oral TID WC   multivitamin  1 tablet Oral QHS   pantoprazole  40 mg Oral BID   sodium chloride flush  10-40 mL Intracatheter Q12H   Continuous Infusions:  sodium chloride     sodium chloride     sodium chloride 10 mL/hr at 09/29/21 1113   [START ON 09/30/2021] ceFEPime (MAXIPIME) IV     norepinephrine (LEVOPHED) Adult infusion Stopped (09/29/21 0920)   [START ON 09/30/2021] vancomycin       LOS: 12 days    Time spent: 35 minutes    Lindsay Loveall Darleen Crocker, DO [facility]  If 7PM-7AM, please contact night-coverage [email or web address] 09/29/2021, 12:56 PM

## 2021-09-29 NOTE — Progress Notes (Signed)
Levo stopped at 0920 this morning and has remained off this shift. Blood pressures monitored and remained with a MAP >60 per verbal from Dr Manuella Ghazi. Patient alert and oriented with no complaints of shortness of breath, chest pain or discomfort. Dr Manuella Ghazi updated and aware.

## 2021-09-29 NOTE — Progress Notes (Signed)
Inpatient Diabetes Program Recommendations  AACE/ADA: New Consensus Statement on Inpatient Glycemic Control   Target Ranges:  Prepandial:   less than 140 mg/dL      Peak postprandial:   less than 180 mg/dL (1-2 hours)      Critically ill patients:  140 - 180 mg/dL   Results for SURIE, SUCHOCKI (MRN [id number]) as of 09/29/2021 08:03  Ref. Range 09/28/2021 07:31 09/28/2021 11:38 09/28/2021 16:00 09/28/2021 19:25 09/28/2021 23:06 09/29/2021 04:12 09/29/2021 07:31  Glucose-Capillary Latest Ref Range: 70 - 99 mg/dL 148 (H) 154 (H) 205 (H) 254 (H) 200 (H) 185 (H) 178 (H)    Review of Glycemic Control  Current orders for Inpatient glycemic control: Levemir 5 units daily, Novolog 0-15 units Q4H; Solucortef 100 mg Q8H  Inpatient Diabetes Program Recommendations:    Insulin: If steroids are continued, please consider increasing Levemir to 10 units daily.  Thanks, Barnie Alderman, RN, MSN, CDE Diabetes Coordinator Inpatient Diabetes Program [phone] (Team Pager from 8am to 5pm)

## 2021-09-29 NOTE — Progress Notes (Addendum)
Pharmacy Antibiotic Note  [name] is a 55 y.o. female admitted on 09/17/2021 with septic shock secondary to left thigh wound  Pharmacy has been consulted for Vancomycin and cefepime dosing. Plan for 10 days ABX, trying to wean off levophed. AF, WBC improved, but still elevated( on steroids)  Plan: Continue Vancomycin 1058m after each HD session on MWF  through 10/12 Continue Cefepime 2gm IV after HD on MWF.through 10/12 Monitor V/S, labs and levels as indicated  Height: 5' 5" (165.1 cm) Weight: 130.3 kg (287 lb 4.2 oz) IBW/kg (Calculated) : 57  Temp (24hrs), Avg:97.7 F (36.5 C), Min:97.5 F (36.4 C), Max:98.3 F (36.8 C)  Recent Labs  Lab 09/24/21 0430 09/25/21 0446 09/26/21 0517 09/27/21 0511 09/28/21 0430 09/29/21 0431  WBC 19.0* 19.2* 14.2* 13.6*  --  15.4*  CREATININE 3.65* 4.40* 3.12* 3.96* 4.68* 3.63*     Estimated Creatinine Clearance: 23.9 mL/min (A) (by C-G formula based on SCr of 3.63 mg/dL (H)).    Allergies  Allergen Reactions   Contrast Media [Iodinated Diagnostic Agents] Anaphylaxis, Hives, Swelling and Other (See Comments)    Dye for cardiac cath. Tongue swells   Pneumococcal Vaccines Swelling and Other (See Comments)    Turns skin black, and bodily swelling   Vancomycin Nausea And Vomiting and Other (See Comments)    Infusion "made me feel like I was dying" had to be readmitted to hospital    Antimicrobials this admission: Vancomycin 10/3 >> 10/12 Cefepime  10/3 >> 10/12 Flagyl 10/3>> 10/9  Microbiology results: 10/3 BCx: ngtd  MRSA PCR: negative  Thank you for allowing pharmacy to be a part of this patient's care.  LIsac Sarna BS Pharm D, BCaliforniaClinical Pharmacist Pager #[phone]/10/2021 8:38 AM

## 2021-09-29 NOTE — Progress Notes (Signed)
Subjective:   HD yest-  able to remove 4 liters -  she feels great-  says she is ready to go home- still on 1 of levo however  Objective Vital signs in last 24 hours: Vitals:   09/28/21 1928 09/28/21 2000 09/28/21 2313 09/29/21 0416  BP:  114/74    Pulse:  (!) 101    Resp:  15    Temp: 98.3 F (36.8 C)  97.6 F (36.4 C) 97.8 F (36.6 C)  TempSrc: Oral  Oral Axillary  SpO2:  100%    Weight:    130.3 kg  Height:       Weight change: -0.2 kg  Intake/Output Summary (Last 24 hours) at 09/29/2021 0913 Last data filed at 09/28/2021 2000 Gross per 24 hour  Intake 582.35 ml  Output 4000 ml  Net -3417.65 ml    Dialyzes at [facility]  MWF-   EDW 122.5/  4 hours and 15 minutes/AVF-  15 gauge/1K bath/2.5 calc/BFR 300. Calcitriol 0.5 q treatment -  no ESA-  Friday PTA left out at 121.8-  heparin 1000 bolus and q hour    Assessment/Plan: 55 year old BF with ESRD, many other problems including lymphedema and chronic thigh wound who presents to hospital with increased thigh wound pain/nausea and missed HD  1 thigh wound-  per primary-  local wound care planned -  developed sepsis so started on vanc and cefepime and now flagyl-  cultures neg but responded well clinically -  not sure how long will be on abx 2 ESRD: normally MWF via AVF-  had not had treatment since 9/23 PTA.   Received HD on 9/30 ,  10/3, 10/5. 10/7 with extra tx 10/8-  Also on 10/10.  Next treatment planned for tomorrow if still here 3 Hypertension: is overloaded but hypotensive -  midodrine and albumin with UF as able.  PLEASE do not bolus with NS unless in distress as is overloaded demonstrated by low sodium.  Making some strides with volume but still overloaded.  Midodrine 25 TID is all we can use as OP.  Have asked nurse to stop levo 1 and see what her BP does today  4. Anemia of ESRD: not on ESA as OP.   Hgb 9-  have added 100 q Friday-  hgb was dropping but now on the increase 5. Metabolic Bone Disease: continue  calcitriol 0.5 TIW-  phos is good on fosrenol and phoslo 6. Hyperkalemia -  seems to be a chronic issue-  on 1 K bath as OP-  given lokelma and  1 k bath 9/30 -  has maintained here,  likely has a lower K diet here-  2 K diet for now  [place]: Basic Metabolic Panel: Recent Labs  Lab 09/27/21 0511 09/28/21 0430 09/29/21 0431  NA 131* 130* 132*  K 3.5 3.9 4.1  CL 92* 91* 93*  CO2 _0 GLUCOSE 171* 159* 192*  BUN 52* 70* 50*  CREATININE 3.96* 4.68* 3.63*  CALCIUM 9.1 9.0 8.9  PHOS 4.3 4.6 3.8   Liver Function Tests: Recent Labs  Lab 09/27/21 0511 09/28/21 0430 09/29/21 0431  ALBUMIN 3.6 3.4* 3.3*   No results for input(s): LIPASE, AMYLASE in the last 168 hours. No results for input(s): AMMONIA in the last 168 hours. CBC: Recent Labs  Lab 09/24/21 0430 09/25/21 0446 09/26/21 0517 09/27/21 0511 09/29/21 0431  WBC 19.0* 19.2* 14.2* 13.6* 15.4*  NEUTROABS 17.1* 17.0*  12.3*  --   --   HGB 8.7* 9.1* 8.2* 8.9* 9.5*  HCT 27.1* 27.3* 25.2* 27.9* 29.5*  MCV 91.9 93.2 93.3 94.3 97.4  PLT 245 255 197 199 254   Cardiac Enzymes: No results for input(s): CKTOTAL, CKMB, CKMBINDEX, TROPONINI in the last 168 hours. CBG: Recent Labs  Lab 09/28/21 1600 09/28/21 1925 09/28/21 2306 09/29/21 0412 09/29/21 0731  GLUCAP 205* 254* 200* 185* 178*    Iron Studies: No results for input(s): IRON, TIBC, TRANSFERRIN, FERRITIN in the last 72 hours. Studies/Results: No results found. Medications: Infusions:  sodium chloride     sodium chloride     sodium chloride 10 mL/hr at 09/27/21 1754   [START ON 09/30/2021] ceFEPime (MAXIPIME) IV     norepinephrine (LEVOPHED) Adult infusion 2 mcg/min (09/29/21 0148)   [START ON 09/30/2021] vancomycin      Scheduled Medications:  amiodarone  200 mg Oral Daily   apixaban  5 mg Oral BID   calcitRIOL  0.5 mcg Oral Q M,W,F-HD   calcium acetate  1,334 mg Oral TID with meals   Chlorhexidine Gluconate Cloth  6 each  Topical Daily   darbepoetin (ARANESP) injection - DIALYSIS  100 mcg Intravenous Q Fri-HD   feeding supplement  237 mL Oral BID BM   hydrocortisone sod succinate (SOLU-CORTEF) inj  100 mg Intravenous Q8H   influenza vac split quadrivalent PF  0.5 mL Intramuscular Tomorrow-1000   insulin aspart  0-15 Units Subcutaneous Q4H   insulin detemir  5 Units Subcutaneous Daily   lanthanum  500 mg Oral TID WC   midodrine  25 mg Oral TID WC   multivitamin  1 tablet Oral QHS   pantoprazole  40 mg Oral BID   sodium chloride flush  10-40 mL Intracatheter Q12H    have reviewed scheduled and prn medications.  Physical Exam: General:  more alert and interactive  Heart: tachy Lungs: decreased effort Abdomen:  obese, soft, non tender Extremities: pitting edema-  left thigh bandaged Dialysis Access: AVF-  good thrill and bruit     09/29/2021,9:13 AM  LOS: 12 days

## 2021-09-30 ENCOUNTER — Telehealth: Payer: Self-pay

## 2021-09-30 DIAGNOSIS — S71102D Unspecified open wound, left thigh, subsequent encounter: Secondary | ICD-10-CM | POA: Diagnosis not present

## 2021-09-30 DIAGNOSIS — I482 Chronic atrial fibrillation, unspecified: Secondary | ICD-10-CM | POA: Diagnosis not present

## 2021-09-30 DIAGNOSIS — Z992 Dependence on renal dialysis: Secondary | ICD-10-CM | POA: Diagnosis not present

## 2021-09-30 DIAGNOSIS — N186 End stage renal disease: Secondary | ICD-10-CM | POA: Diagnosis not present

## 2021-09-30 DIAGNOSIS — A419 Sepsis, unspecified organism: Secondary | ICD-10-CM | POA: Diagnosis not present

## 2021-09-30 DIAGNOSIS — R579 Shock, unspecified: Secondary | ICD-10-CM

## 2021-09-30 DIAGNOSIS — E0800 Diabetes mellitus due to underlying condition with hyperosmolarity without nonketotic hyperglycemic-hyperosmolar coma (NKHHC): Secondary | ICD-10-CM | POA: Diagnosis not present

## 2021-09-30 LAB — RENAL FUNCTION PANEL
Albumin: 3.2 g/dL — ABNORMAL LOW (ref 3.5–5.0)
Anion gap: 11 (ref 5–15)
BUN: 72 mg/dL — ABNORMAL HIGH (ref 6–20)
CO2: 26 mmol/L (ref 22–32)
Calcium: 9.1 mg/dL (ref 8.9–10.3)
Chloride: 93 mmol/L — ABNORMAL LOW (ref 98–111)
Creatinine, Ser: 4.38 mg/dL — ABNORMAL HIGH (ref 0.44–1.00)
GFR, Estimated: 11 mL/min — ABNORMAL LOW (ref >60)
Glucose, Bld: 141 mg/dL — ABNORMAL HIGH (ref 70–99)
Phosphorus: 4.3 mg/dL (ref 2.5–4.6)
Potassium: 4.6 mmol/L (ref 3.5–5.1)
Sodium: 130 mmol/L — ABNORMAL LOW (ref 135–145)

## 2021-09-30 LAB — GLUCOSE, CAPILLARY
Glucose-Capillary: 142 mg/dL — ABNORMAL HIGH (ref 70–99)
Glucose-Capillary: 126 mg/dL — ABNORMAL HIGH (ref 70–99)
Glucose-Capillary: 126 mg/dL — ABNORMAL HIGH (ref 70–99)
Glucose-Capillary: 156 mg/dL — ABNORMAL HIGH (ref 70–99)
Glucose-Capillary: 156 mg/dL — ABNORMAL HIGH (ref 70–99)
Glucose-Capillary: 147 mg/dL — ABNORMAL HIGH (ref 70–99)

## 2021-09-30 LAB — BASIC METABOLIC PANEL
Anion gap: 12 (ref 5–15)
BUN: 71 mg/dL — ABNORMAL HIGH (ref 6–20)
CO2: 26 mmol/L (ref 22–32)
Calcium: 9.1 mg/dL (ref 8.9–10.3)
Chloride: 93 mmol/L — ABNORMAL LOW (ref 98–111)
Creatinine, Ser: 4.38 mg/dL — ABNORMAL HIGH (ref 0.44–1.00)
GFR, Estimated: 11 mL/min — ABNORMAL LOW (ref >60)
Glucose, Bld: 140 mg/dL — ABNORMAL HIGH (ref 70–99)
Potassium: 4.6 mmol/L (ref 3.5–5.1)
Sodium: 131 mmol/L — ABNORMAL LOW (ref 135–145)

## 2021-09-30 LAB — CBC
HCT: 29.7 % — ABNORMAL LOW (ref 36.0–46.0)
Hemoglobin: 9.4 g/dL — ABNORMAL LOW (ref 12.0–15.0)
MCH: 30.6 pg (ref 26.0–34.0)
MCHC: 31.6 g/dL (ref 30.0–36.0)
MCV: 96.7 fL (ref 80.0–100.0)
Platelets: 243 10*3/uL (ref 150–400)
RBC: 3.07 MIL/uL — ABNORMAL LOW (ref 3.87–5.11)
RDW: 24.2 % — ABNORMAL HIGH (ref 11.5–15.5)
WBC: 17.4 10*3/uL — ABNORMAL HIGH (ref 4.0–10.5)
nRBC: 1.2 % — ABNORMAL HIGH (ref 0.0–0.2)

## 2021-09-30 LAB — MAGNESIUM: Magnesium: 2.1 mg/dL (ref 1.7–2.4)

## 2021-09-30 MED ORDER — HEPARIN (PORCINE) 25000 UT/250ML-% IV SOLN
1100.0000 [IU]/h | INTRAVENOUS | Status: DC
  Administered 2021-09-30: 1300 [IU]/h via INTRAVENOUS
  Administered 2021-10-01: 1100 [IU]/h via INTRAVENOUS
  Filled 2021-09-30 (×2): qty 250

## 2021-09-30 MED ORDER — ALBUMIN HUMAN 25 % IV SOLN
25.0000 g | Freq: Once | INTRAVENOUS | Status: AC
  Administered 2021-09-30: 25 g via INTRAVENOUS

## 2021-09-30 MED ORDER — NOREPINEPHRINE 4 MG/250ML-% IV SOLN: 2.0000 ug/min | INTRAVENOUS | Status: DC

## 2021-09-30 MED ORDER — VANCOMYCIN VARIABLE DOSE PER UNSTABLE RENAL FUNCTION (PHARMACIST DOSING): Status: DC

## 2021-09-30 NOTE — H&P (Addendum)
NAME:  [name], MRN:  [id number], DOB:  23-May-1966, LOS: 56 ADMISSION DATE:  09/17/2021, CONSULTATION DATE:  10/12 REFERRING MD:  shahmehdi, CHIEF COMPLAINT:  wound eval and sepsis    History of Present Illness:  55 year old female with multiple medical problems.  Admitted from home initially to [facility] on 9/29 with chief complaint of worsening left thigh wound.  Discharged 9/5 after prolonged hospital stay for infected left thigh wound with wound VAC, which she refused. Actually presented to the ER 9/9 at advice of wound care nurse for concern about infection.  Was discharged with recommendations to continue current wound care.  She was supposed to see plastic surgery on 9/27 however presented to the ER once again at the recommendation of her wound care nurse for evaluation of left thigh wound with concern about worsening infection.  In ER she was noted to be negative for fever chills shortness of breath.  She had missed 2 doses of dialysis prior to presentation.  Plastic surgery was consulted in the ER and recommended removing wound VAC, wound this was removed she had superficial venous bleeding due to rupture of superficial vein which was controlled with direct pressure, sutures, and compression wrap.  Vascular surgery was consulted and recommended packing the wound and applying compression wrap.  She was initially admitted for evaluation and monitoring of this wound. Hospital course 9/29 through 10/12: Admitted.  Nephrology consulted and assisted with dialysis.  Volume status managed through dialysis with volume removal.  Wound managed with Xeroform dressings changed daily initially.  Initial hospital course complicated significantly by difficult to control electrolyte disturbances. 10/3: White blood cell increased from 9.9-29.1, new acute metabolic encephalopathy, new SIRS/sepsis which was not present on admission.  She was hypotensive.  Transferred to the intensive care and  started on norepinephrine.  Blood cultures were sent. Vanc and cefepime started. WOC re-consulted: no change in rx advised. 10/4 right femoral CVL placed by surgery. 10/5 added hydrocortisone for stress dose. Still pressor dependent but mental status better. Midodrine increased 10/6, still pressor dependent so midodrine increased to 19m tid, 10/11 off pressors. 10/12 back on pressors. Concern for on-going wound infection. Surgery consulted to evaluate on [facility] to [facility]. [facility]red to [facility] for surgical evaluation and possible need for CRRT>  Pertinent  Medical History   Infected left thigh wound hospitalized from 7/3 through 9/5 (64 days) required incision and drainage, plastic surgery consultation and wound VAC.  Hospital course complicated by septic shock and heart failure .  HTN, ESRD (MWF), HFpEF/ chronic, COPD, type II dm, chronic Lymphedema of lower extremities,  MO (class III BMI 49.9)  HLD, atrial fib, anxiety, Chronic pain, Prior respiratory arrest after anesthesia at age 55 CAD last cath 2010. OSA  Significant Hospital Events: Including procedures, antibiotic start and stop dates in addition to other pertinent events   9/29 admitted w/ concern for infected left thigh concern. Not clearly infected BUT did have bleeding on removal of wound VAC req 'd pressure and suture. Plastic surg recommended dc wound vac and daily change 10/4 developed shock and encephalopathy. CT thigh. Soft tissue wound along the anteromedial distal thigh with surrounding skin thickening and underlying subcutaneous edema. No fluid collection or subcutaneous emphysema.Started on vanc and cefepime. Right femoral CVL placed.  10/5 hydrocortisone started 10/6, still pressor dependent so midodrine increased to 224mtid 10/11 off pressors 10/12 back on pressors. Transferred to [facility] for surgical eval and possible need for CRRT   Interim History /  Subjective:  No distress  Objective   Blood pressure (Abnormal) 124/56, pulse  (Abnormal) 110, temperature (Abnormal) 97.5 F (36.4 C), temperature source Oral, resp. rate 17, height _0  (1.651 m), weight 130 kg, SpO2 99 %.        Intake/Output Summary (Last 24 hours) at 09/30/2021 1305 Last data filed at 09/30/2021 1200 Gross per 24 hour  Intake 539.41 ml  Output no documentation  Net 539.41 ml   Filed Weights   09/29/21 0416 09/30/21 0351 09/30/21 0941  Weight: 130.3 kg 131.7 kg 130 kg    Examination: General: 55 year old female she is resting in bed. Not in acute distress But is frustrated w/ being in hospital  HENT: NCAT, no JVD MMM Lungs: clear no accessory use. Currently on 2 lpm Cardiovascular: AF w/ CVR Abdomen: soft not tender + bowel sounds  Extremities: left LE wound foul smelling w/ frank purulent drainage.  Neuro: awake and oriented GU: anuric   Resolved Hospital Problem list   Septic shock Acute metabolic encephalopathy  Assessment & Plan:  Undifferentiated shock (since 10/4, was initially sepsis/septic shock from the left thigh.). Its not clear to me at this point if this reflects ongoing infection/sepsis or simply the patient being intolerant of dialysis.  plan Admit to ICU Day 9 vanc and cefepime  Continue to titrate norepinephrine for mean arterial pressure greater than 55 and systolic blood pressure greater than 85 Keep euvolemic Continue stress dose steroids Continue midodrine It appears as though wound ostomy team is already reached out to plastics, I have left message, also spoke to general surgery, general surgery happy to consult on 10/13 if plastics unable to assist Of note has a femoral line which is been in since 10/4, might need to consider removing  Chronic left medial thigh wound. Plan Continue daily dressings as directed by W OC, further guidance as instructed by surgery and or plastics Holding DOAC (apixaban in anticipation of possible debridement need)  chronic diastolic heart failure -Has not had a portable  chest x-ray since 10/3, is on minimal supplemental oxygen Plan Continue telemetry Chest x-ray Volume removal as tolerated  History of paroxysmal atrial fibrillation Plan  continue apixaban and amiodarone (hold apixaban given need for dbridement)   End-stage renal disease, typically on dialysis Monday/Wednesday/Friday.  Having difficulty tolerating intermittent dialysis with ongoing hypotension, and volume overload Plan Will notify nephrology of arrival Strict intake output  Fluid electrolyte imbalance in the setting of end-stage renal disease Plan Continue to monitor Correct as indicated  Anemia of end-stage renal disease without evidence of bleeding Plan Trend CBC Transfusion trigger less than 7  Type 2 diabetes Plan Sliding scale insulin     Best Practice (right click and "Reselect all SmartList Selections" daily)   Diet/type: Regular consistency (see orders) DVT prophylaxis: DOAC GI prophylaxis: PPI Lines: Central line and yes and it is still needed Foley:  N/A Code Status:  full code Last date of multidisciplinary goals of care discussion [pending ]  Labs   CBC: Recent Labs  Lab 09/24/21 0430 09/25/21 0446 09/26/21 0517 09/27/21 0511 09/29/21 0431 09/30/21 0423  WBC 19.0* 19.2* 14.2* 13.6* 15.4* 17.4*  NEUTROABS 17.1* 17.0* 12.3*  --   --   --   HGB 8.7* 9.1* 8.2* 8.9* 9.5* 9.4*  HCT 27.1* 27.3* 25.2* 27.9* 29.5* 29.7*  MCV 91.9 93.2 93.3 94.3 97.4 96.7  PLT 245 255 197 199 254 202    Basic Metabolic Panel: Recent Labs  Lab 09/26/21 0517  09/27/21 0511 09/28/21 0430 09/29/21 0431 09/30/21 0423  NA 133* 131* 130* 132* 130*  131*  K 3.3* 3.5 3.9 4.1 4.6  4.6  CL 93* 92* 91* 93* 93*  93*  CO2 _0 GLUCOSE 229* 171* 159* 192* 141*  140*  BUN 35* 52* 70* 50* 72*  71*  CREATININE 3.12* 3.96* 4.68* 3.63* 4.38*  4.38*  CALCIUM 8.8* 9.1 9.0 8.9 9.1  9.1  MG 1.9 2.1 2.1 2.0 2.1  PHOS 3.8 4.3 4.6 3.8 4.3   GFR: Estimated  Creatinine Clearance: 19.7 mL/min (A) (by C-G formula based on SCr of 4.38 mg/dL (H)). Recent Labs  Lab 09/26/21 0517 09/27/21 0511 09/29/21 0431 09/30/21 0423  WBC 14.2* 13.6* 15.4* 17.4*    Liver Function Tests: Recent Labs  Lab 09/26/21 0517 09/27/21 0511 09/28/21 0430 09/29/21 0431 09/30/21 0423  ALBUMIN 3.1* 3.6 3.4* 3.3* 3.2*   No results for input(s): LIPASE, AMYLASE in the last 168 hours. No results for input(s): AMMONIA in the last 168 hours.  ABG    Component Value Date/Time   PHART 7.299 (L) 06/22/2021 1414   PCO2ART 47.5 06/22/2021 1414   PO2ART 89 06/22/2021 1414   HCO3 28.8 (H) 07/02/2021 1321   TCO2 31 07/02/2021 1321   ACIDBASEDEF 3.0 (H) 06/22/2021 1414   O2SAT 53.0 07/02/2021 1321     Coagulation Profile: Recent Labs  Lab 09/29/21 1126  INR 2.4*    Cardiac Enzymes: No results for input(s): CKTOTAL, CKMB, CKMBINDEX, TROPONINI in the last 168 hours.  HbA1C: Hgb A1c MFr Bld  Date/Time Value Ref Range Status  09/18/2021 06:20 AM 6.8 (H) 4.8 - 5.6 % Final    Comment:    (NOTE) Pre diabetes:          5.7%-6.4%  Diabetes:              >6.4%  Glycemic control for   <7.0% adults with diabetes   06/22/2021 04:54 AM 6.9 (H) 4.8 - 5.6 % Final    Comment:    (NOTE) Pre diabetes:          5.7%-6.4%  Diabetes:              >6.4%  Glycemic control for   <7.0% adults with diabetes     CBG: Recent Labs  Lab 09/29/21 1919 09/29/21 2305 09/30/21 0340 09/30/21 0725 09/30/21 1132  GLUCAP 192* 164* 142* 126* 126*    Review of Systems:   See above   Past Medical History:  She,  has a past medical history of Allergy, Anemia, unspecified, Anxiety, Arthritis, Asthma, Cancer ([place]), Cellulitis, CHF (congestive heart failure) ([place]), Chiari malformation, Chiari malformation, Chronic kidney disease, Chronic kidney disease, stage 5 ([place]), Chronic obstructive pulmonary disease, unspecified ([place]), Chronic pain, Complication of anesthesia (11/28/15),  Compression of brain ([place]), Constipation, COPD (chronic obstructive pulmonary disease) ([place]), Coronary artery disease, Diabetes mellitus, Diabetes mellitus without complication ([place]), Essential (primary) hypertension, Fibromyalgia, Fibromyalgia, History of blood transfusion, echocardiogram (10/2011), Hypercholesterolemia, Hyperlipidemia, unspecified, Hypertension, Hypertensive chronic kidney disease with stage 1 through stage 4 chronic kidney disease, or unspecified chronic kidney disease, Hypertensive chronic kidney disease with stage 5 chronic kidney disease or end stage renal disease ([place]), Lymph edema, Lymphedema, not elsewhere classified, Morbid (severe) obesity due to excess calories ([place]), Obesity hypoventilation syndrome ([place]), On home oxygen therapy, Peritonitis, unspecified ([place]), Pneumonia, Pulmonary hypertension ([place]), Renal disorder, S/P colonoscopy (05/26/2007), S/P endoscopy (05/01/2009), Shortness of breath dyspnea, Sleep apnea, Sleep apnea,  unspecified, Type 2 diabetes mellitus with diabetic nephropathy ([place]), and Type 2 diabetes mellitus with hyperglycemia ([employer]).   Surgical History:   Past Surgical History:  Procedure Laterality Date   .Hemodialysis catheter Right 11/28/2015   A/V FISTULAGRAM N/A 07/26/2017   Procedure: A/V Fistulagram - Left Arm;  Surgeon: Serafina Mitchell, MD;  Location: [place] [facility];  Service: Cardiovascular;  Laterality: N/A;   ABDOMINAL HYSTERECTOMY     total hysterectomy   ADENOIDECTOMY     APPLICATION OF WOUND VAC Left 08/19/2021   Procedure: APPLICATION OF WOUND VAC;  Surgeon: Wallace Going, DO;  Location: [place];  Service: Plastics;  Laterality: Left;   AV FISTULA PLACEMENT Left 03/15/2016   Procedure: CREATION OF LEFT ARM ARTERIOVENOUS (AV) FISTULA  ;  Surgeon: Rosetta Posner, MD;  Location: [place];  Service: Vascular;  Laterality: Left;   CAPD INSERTION N/A 08/30/2017   Procedure: LAPAROSCOPIC INSERTION CONTINUOUS AMBULATORY PERITONEAL DIALYSIS  (CAPD)  CATHETER;  Surgeon: Clovis Riley, MD;  Location: [place];  Service: General;  Laterality: N/A;   CARDIOVERSION N/A 07/03/2021   Procedure: CARDIOVERSION;  Surgeon: Larey Dresser, MD;  Location: [facility] ENDOSCOPY;  Service: Cardiovascular;  Laterality: N/A;   CESAREAN SECTION      x 2   CESAREAN SECTION N/A    Phreesia 11/04/2020   CRANIECTOMY SUBOCCIPITAL W/ CERVICAL LAMINECTOMY / CHIARI     FISTULA SUPERFICIALIZATION Left 05/10/2016   Procedure: Left Arm FISTULA SUPERFICIALIZATION;  Surgeon: Rosetta Posner, MD;  Location: [place];  Service: Vascular;  Laterality: Left;   I & D EXTREMITY Left 08/19/2021   Procedure: Debridement of left thigh wound;  Surgeon: Wallace Going, DO;  Location: [place];  Service: Plastics;  Laterality: Left;   IR GENERIC HISTORICAL  08/14/2016   IR REMOVAL TUN ACCESS W/ PORT W/O FL MOD SED 08/14/2016 Arne [place], MD [facility]   IR SINUS/FIST TUBE CHK-NON GI  01/01/2019   IR THROMBECTOMY AV FISTULA W/THROMBOLYSIS INC/SHUNT/IMG LEFT Left 08/13/2021   IR US GUIDE BX ASP/DRAIN  12/28/2018   IR US GUIDE VASC ACCESS LEFT  08/13/2021   MINOR REMOVAL OF PERITONEAL DIALYSIS CATHETER N/A 04/26/2019   Procedure: REMOVAL OF INFECTED PERITONEAL DIALYSIS CATHETER;  Surgeon: Erroll Luna, MD;  Location: [place];  Service: General;  Laterality: N/A;   PERIPHERAL VASCULAR BALLOON ANGIOPLASTY Left 10/18/2019   Procedure: PERIPHERAL VASCULAR BALLOON ANGIOPLASTY;  Surgeon: Marty Heck, MD;  Location: [place] [facility];  Service: Cardiovascular;  Laterality: Left;  arm fistula   PERIPHERAL VASCULAR CATHETERIZATION Left 11/25/2016   Procedure: A/V Fistulagram;  Surgeon: Conrad [place], MD;  Location: [place] [facility];  Service: Cardiovascular;  Laterality: Left;  arm   PORTACATH PLACEMENT  07/05/2012   Procedure: INSERTION PORT-A-CATH;  Surgeon: Donato Heinz, MD;  Location: [facility];  Service: General;  Laterality: Left;  subclavian   portacath removal     RIGHT HEART CATH N/A  08/04/2017   Procedure: RIGHT HEART CATH;  Surgeon: Jolaine Artist, MD;  Location: [place] [facility];  Service: Cardiovascular;  Laterality: N/A;   RIGHT HEART CATH N/A 07/02/2021   Procedure: RIGHT HEART CATH;  Surgeon: Larey Dresser, MD;  Location: [place] [facility];  Service: Cardiovascular;  Laterality: N/A;   TEE WITHOUT CARDIOVERSION N/A 07/03/2021   Procedure: TRANSESOPHAGEAL ECHOCARDIOGRAM (TEE);  Surgeon: Larey Dresser, MD;  Location: [facility] ENDOSCOPY;  Service: Cardiovascular;  Laterality: N/A;   THYROIDECTOMY, PARTIAL  TONSILLECTOMY       Social History:   reports that she has never smoked. She has never used smokeless tobacco. She reports that she does not drink alcohol and does not use drugs.   Family History:  Her family history includes Colon cancer (age of onset: 38) in her mother; Coronary artery disease in her mother; Stroke (age of onset: 3) in her mother.   Allergies Allergies  Allergen Reactions   Contrast Media [Iodinated Diagnostic Agents] Anaphylaxis, Hives, Swelling and Other (See Comments)    Dye for cardiac cath. Tongue swells   Pneumococcal Vaccines Swelling and Other (See Comments)    Turns skin black, and bodily swelling   Vancomycin Nausea And Vomiting and Other (See Comments)    Infusion "made me feel like I was dying" had to be readmitted to hospital     Home Medications  Prior to Admission medications   Medication Sig Start Date End Date Taking? Authorizing Provider  acetaminophen (TYLENOL) 325 MG tablet Take 2 tablets (650 mg total) by mouth every 4 (four) hours as needed for mild pain (or Fever >/= 101). 07/04/20  Yes Emokpae, Courage, MD  amiodarone (PACERONE) 200 MG tablet Take 1 tablet (200 mg total) by mouth daily. 08/21/21 09/20/21 Yes Dahal, Marlowe Aschoff, MD  apixaban (ELIQUIS) 5 MG TABS tablet Take 1 tablet (5 mg total) by mouth 2 (two) times daily. 08/21/21 09/20/21 Yes Dahal, Marlowe Aschoff, MD  calcium acetate (PHOSLO) 667 MG capsule Take 1,334 mg  by mouth 3 (three) times daily. 05/27/21  Yes [provider]  CONSTULOSE 10 GM/15ML solution Take 20 g by mouth daily. 09/09/21  Yes [provider]  Darbepoetin Alfa (ARANESP) 25 MCG/0.42ML SOSY injection Inject 0.42 mLs (25 mcg total) into the vein every Wednesday with hemodialysis. 08/26/21  Yes Bonnell Public, MD  lanthanum (FOSRENOL) 500 MG chewable tablet Chew 1,000-1,500 mg by mouth 5 (five) times daily. 1500 mg by mouth three times a day with meals and 1000 mg by mouth twice a day with snacks   Yes [provider]  loperamide (IMODIUM) 2 MG capsule Take 2-4 mg by mouth as needed for diarrhea or loose stools.    Yes [provider]  metoprolol tartrate (LOPRESSOR) 25 MG tablet Take 0.5 tablets (12.5 mg total) by mouth 2 (two) times daily. 08/21/21 09/20/21 Yes Dahal, Marlowe Aschoff, MD  multivitamin (RENA-VIT) TABS tablet Take 1 tablet by mouth daily. 05/30/18  Yes [provider]  oxyCODONE (ROXICODONE) 5 MG immediate release tablet Take 2 tablets (10 mg total) by mouth every 6 (six) hours as needed for up to 3 days for severe pain. 08/28/21 09/17/21 Yes Trifan, Carola Rhine, MD  pantoprazole (PROTONIX) 40 MG tablet Take 1 tablet (40 mg total) by mouth 2 (two) times daily. 08/21/21 09/20/21 Yes Dahal, Marlowe Aschoff, MD  Sennosides (EX-LAX PO) Take 2 tablets by mouth daily as needed (constipation).   Yes [provider]  Oxycodone HCl 10 MG TABS Take 1 tablet (10 mg total) by mouth 4 (four) times daily as needed (pain). Patient not taking: No sig reported 06/21/21   Tat, Shanon Brow, MD  insulin lispro (HUMALOG) 100 UNIT/ML injection Inject 60 Units into the skin 2 (two) times daily.    03/08/12  [provider]     Critical care time: 32 min     [name] ACNP-BC [place] Pager # [phone] OR # [phone] if no answer

## 2021-09-30 NOTE — Progress Notes (Signed)
ANTICOAGULATION CONSULT NOTE - Initial Consult  Pharmacy Consult for Heparin Indication: atrial fibrillation  Allergies  Allergen Reactions   Contrast Media [Iodinated Diagnostic Agents] Anaphylaxis, Hives, Swelling and Other (See Comments)    Dye for cardiac cath. Tongue swells   Pneumococcal Vaccines Swelling and Other (See Comments)    Turns skin black, and bodily swelling   Vancomycin Nausea And Vomiting and Other (See Comments)    Infusion "made me feel like I was dying" had to be readmitted to hospital    Patient Measurements: Height: 5' 5" (165.1 cm) Weight: 127.7 kg (281 lb 8.4 oz) IBW/kg (Calculated) : 57 Heparin Dosing Weight: 88 kg  Vital Signs: Temp: 97.7 F (36.5 C) (10/12 1326) Temp Source: Oral (10/12 1326) BP: 121/88 (10/12 1745) Pulse Rate: 98 (10/12 1745)  Labs: Recent Labs    09/28/21 0430 09/29/21 0431 09/29/21 1126 09/30/21 0423  HGB  --  9.5*  --  9.4*  HCT  --  29.5*  --  29.7*  PLT  --  254  --  243  LABPROT  --   --  26.5*  --   INR  --   --  2.4*  --   CREATININE 4.68* 3.63*  --  4.38*  4.38*    Estimated Creatinine Clearance: 19.5 mL/min (A) (by C-G formula based on SCr of 4.38 mg/dL (H)).   Medical History: Past Medical History:  Diagnosis Date   Allergy    Phreesia 11/04/2020   Anemia, unspecified    Anxiety    Arthritis    Phreesia 11/04/2020   Asthma    as a child   Cancer ([place])    thyroid   Cellulitis    CHF (congestive heart failure) ([place])    Chiari malformation    s/p surgery   Chiari malformation    Chronic kidney disease    Phreesia 11/04/2020   Chronic kidney disease, stage 5 (HCC)    Chronic obstructive pulmonary disease, unspecified ([place])    Chronic pain    Complication of anesthesia 11/28/15   Resp arrest after  conscious  sedation   Compression of brain (HCC)    Constipation    COPD (chronic obstructive pulmonary disease) ([place])    Phreesia 11/04/2020   Coronary artery disease    40-50% mid LAD  04/29/09, Medical tx. (Dr. Gwenlyn Found)   Diabetes mellitus    Type II- reports being off all medication d/t it being controlled   Diabetes mellitus without complication ([place])    Phreesia 11/04/2020   Essential (primary) hypertension    Fibromyalgia    Fibromyalgia    History of blood transfusion    hemorrage duinrg pregancy   Hx of echocardiogram 10/2011   EF 55-60%   Hypercholesterolemia    Hyperlipidemia, unspecified    Hypertension    Hypertensive chronic kidney disease with stage 1 through stage 4 chronic kidney disease, or unspecified chronic kidney disease    Hypertensive chronic kidney disease with stage 5 chronic kidney disease or end stage renal disease (HCC)    Lymph edema    Lymphedema, not elsewhere classified    Morbid (severe) obesity due to excess calories ([place])    Obesity hypoventilation syndrome ([place])    On home oxygen therapy    "2L; 24/7" (12/21/2018)   Peritonitis, unspecified ([place])    Pneumonia    in past   Pulmonary hypertension ([place])    Renal disorder    M/W/F [facility] started dialysis in Dec.2016  S/P colonoscopy 05/26/2007   Dr. Laural Golden sigmoid diverticulosis random biopsies benign   S/P endoscopy 05/01/2009   Dr. Penelope Coop pill-induced esophageal ulcerations distal to midesophagus, 2 small ulcers in the antrum of the stomach   Shortness of breath dyspnea    with any exertion or if heart rate  is irregular while on dialysis   Sleep apnea    reports that she no longer needs CPAP due to weight loss   Sleep apnea, unspecified    Type 2 diabetes mellitus with diabetic nephropathy ([place])    Type 2 diabetes mellitus with hyperglycemia ([place])     Assessment: [name] is a 55 y.o. female admitted on 09/17/2021 with worsening L thigh wound. PMH includes ESRD on HD MWF. Patient transferred from [facility] to [facility] for surgical consult and possible need for CRRT. Pharmacy consulted to dose heparin for Afib. Patient is on apixaban prior to admission for Afib  and continued while inpatient. Last dose apixaban 64m 10/12 at ~0930.   Goal of Therapy:  Heparin level 0.3-0.7 units/ml aPTT 66-102 seconds Monitor platelets by anticoagulation protocol: Yes   Plan:  Start heparin 1300 units/hr at 2130 Check 8 hr heparin level and aPTT - expect heparin level to be falsely elevated due to previous apixaban and will adjust heparin via aPTT for now Monitor aPTT, heparin level, CBC and s/s of bleeding daily  Follow up for aPTT and heparin level correlation and transition to heparin level monitoring when correlated  GCristela Felt PharmD, BCPS Clinical Pharmacist 09/30/2021 6:14 PM

## 2021-09-30 NOTE — Progress Notes (Signed)
Patient arrived to [facility] ICU [place] at this time. Patient vitals stable was on small amount of levo but was turned off upon arrival due to BP being elevated. Patient A/Ox4. Patient able to move extremities. MD/NP aware of patient arrival and came to bedside to assess patient at this time. Wounds assessed. Will continue to monitor.

## 2021-09-30 NOTE — Progress Notes (Signed)
Pharmacy Antibiotic Note  [name] is a 55 y.o. female admitted on 09/17/2021 with worsening L thigh wound. PMH includes ESRD on HD MWF. Patient transferred from [facility] to [facility] for surgical consult and possible need for CRRT. Pharmacy has been consulted to continue vancomycin and cefepime dosing for wound infection. Patient has been receiving cefepime 2g every MWF with iHD and vancomycin 1000 every MWF with iHD with intention to stop on 10/12. Last doses of cefepime and vancomycin given with iHD today.   Plan: Will follow up nephrology plans for iHD vs. CRRT and will dose vancomycin and cefepime accordingly.   Height: 5' 5" (165.1 cm) Weight: 127.7 kg (281 lb 8.4 oz) IBW/kg (Calculated) : 57  Temp (24hrs), Avg:97.6 F (36.4 C), Min:97.2 F (36.2 C), Max:98 F (36.7 C)  Recent Labs  Lab 09/25/21 0446 09/26/21 0517 09/27/21 0511 09/28/21 0430 09/29/21 0431 09/30/21 0423  WBC 19.2* 14.2* 13.6*  --  15.4* 17.4*  CREATININE 4.40* 3.12* 3.96* 4.68* 3.63* 4.38*  4.38*    Estimated Creatinine Clearance: 19.5 mL/min (A) (by C-G formula based on SCr of 4.38 mg/dL (H)).    Allergies  Allergen Reactions   Contrast Media [Iodinated Diagnostic Agents] Anaphylaxis, Hives, Swelling and Other (See Comments)    Dye for cardiac cath. Tongue swells   Pneumococcal Vaccines Swelling and Other (See Comments)    Turns skin black, and bodily swelling   Vancomycin Nausea And Vomiting and Other (See Comments)    Infusion "made me feel like I was dying" had to be readmitted to hospital    Antimicrobials this admission: Vancomycin 10/3 >>  Cefepime 10/3 >>  Metronidazole 10/3 >> 10/9  Dose adjustments this admission:   Microbiology results: 9/29 bcx: negative  10/3 bcx: negative   Thank you for allowing pharmacy to be a part of this patient's care.  Cristela Felt, PharmD, BCPS Clinical Pharmacist 09/30/2021 5:08 PM

## 2021-09-30 NOTE — Plan of Care (Signed)

## 2021-09-30 NOTE — Telephone Encounter (Signed)
Secure chat received from Domenic Moras, FNP:  Hi!  This patient's leg wound remains malodorous, sepsis ongoing.  Medical team at [facility] has decided to move her to [facility].  Can you let the MD know and ask if we should still continue Xeroform or if [place] team can make recommendations once she arrives to this campus?

## 2021-09-30 NOTE — Progress Notes (Signed)
Pt cut off 30 mins on HD tx time.d/t tired. AMA form signed. Dr. Marval Regal notified. Pt taking off 2.5 L. Report given to Kelvin Cellar RN.

## 2021-09-30 NOTE — Procedures (Signed)
I was present at this dialysis session. I have reviewed the session itself and made appropriate changes.   Vital signs in last 24 hours:  Temp:  [97.2 F (36.2 C)-98 F (36.7 C)] 97.5 F (36.4 C) (10/12 1133) Pulse Rate:  [36-146] 50 (10/12 1133) Resp:  [12-22] 16 (10/12 1133) BP: (80-140)/(54-97) 122/63 (10/12 1115) SpO2:  [92 %-100 %] 98 % (10/12 1133) Weight:  [130 kg-131.7 kg] 130 kg (10/12 0941) Weight change: 0.7 kg Filed Weights   09/29/21 0416 09/30/21 0351 09/30/21 0941  Weight: 130.3 kg 131.7 kg 130 kg    Recent Labs  Lab 09/30/21 0423  NA 130*  131*  K 4.6  4.6  CL 93*  93*  CO2 26  26  GLUCOSE 141*  140*  BUN 72*  71*  CREATININE 4.38*  4.38*  CALCIUM 9.1  9.1  PHOS 4.3    Recent Labs  Lab 09/24/21 0430 09/25/21 0446 09/26/21 0517 09/27/21 0511 09/29/21 0431 09/30/21 0423  WBC 19.0* 19.2* 14.2* 13.6* 15.4* 17.4*  NEUTROABS 17.1* 17.0* 12.3*  --   --   --   HGB 8.7* 9.1* 8.2* 8.9* 9.5* 9.4*  HCT 27.1* 27.3* 25.2* 27.9* 29.5* 29.7*  MCV 91.9 93.2 93.3 94.3 97.4 96.7  PLT 245 255 197 199 254 243    Scheduled Meds:  amiodarone  200 mg Oral Daily   apixaban  5 mg Oral BID   calcitRIOL  0.5 mcg Oral Q M,W,F-HD   calcium acetate  1,334 mg Oral TID with meals   Chlorhexidine Gluconate Cloth  6 each Topical Daily   Chlorhexidine Gluconate Cloth  6 each Topical Q0600   darbepoetin (ARANESP) injection - DIALYSIS  100 mcg Intravenous Q Fri-HD   feeding supplement  237 mL Oral BID BM   hydrocortisone sod succinate (SOLU-CORTEF) inj  100 mg Intravenous Q8H   influenza vac split quadrivalent PF  0.5 mL Intramuscular Tomorrow-1000   insulin aspart  0-15 Units Subcutaneous Q4H   insulin detemir  5 Units Subcutaneous Daily   lanthanum  500 mg Oral TID WC   midodrine  25 mg Oral TID WC   multivitamin  1 tablet Oral QHS   pantoprazole  40 mg Oral BID   sodium chloride flush  10-40 mL Intracatheter Q12H   Continuous Infusions:  sodium chloride      sodium chloride     sodium chloride 10 mL/hr at 09/29/21 1113   ceFEPime (MAXIPIME) IV     norepinephrine (LEVOPHED) Adult infusion 2 mcg/min (09/30/21 1043)   vancomycin     PRN Meds:.sodium chloride, sodium chloride, heparin, HYDROmorphone (DILAUDID) injection, lidocaine (PF), lidocaine-prilocaine, metoprolol tartrate, ondansetron (ZOFRAN) IV, pentafluoroprop-tetrafluoroeth, prochlorperazine, sodium chloride flush    Assessment and plan: Thigh wound with septic shock- will need surgical debridement.  Continue with IV vanc/cefepime/flagyl.  Cultures negative to date.  ESRD - continue with IHD on MWF schedule, however SBP dropped into the 80's and required resumption of levophed.  She may be better suited for transfer to [facility] ICU for CRRT and surgical intervention. Chronic hypotension on midodrine as outpatient- volume overloaded and hypotensive despite IV albumin.  Now currently on levophed as above. Anemia of ESRD - started on ESA.  Transfuse prn Acute on chronic diastolic CHF- UF with HD as bp tolerates Disposition - pt wants to go home, however she remains critically ill.  May benefit from transfer to [facility] for CRRT and surgical debridement.  Donetta Potts,  MD 09/30/2021, 11:42 AM

## 2021-09-30 NOTE — Progress Notes (Signed)
PROGRESS NOTE    Lindsay AMSDEN  LFY:[id number] DOB: Apr 20, 1966 DOA: 09/17/2021 PCP: Noreene Larsson, NP  Objective:  Patient was seen and examined this morning, hypotensive on Levophed, ongoing dialysis.  The wound on her thigh was addressed and examined, continue to have malodorous odor, pus present  Discussed with nephrologist as patient remains hypotensive needing CRRT recommended transfer to [facility] ICU   Brief Narrative:   [name] is a 55 year old female with past medical history significant for essential hypertension, ESRD on HD (MWF), chronic diastolic congestive heart failure, COPD, type 2 diabetes mellitus, chronic lymphedema, morbid obesity who presents to [facility] ED on 9/29 with worsening left thigh infection, generalized weakness/fatigue in the setting of missed dialysis sessions.  Patient been complaining of worsening pain and purulent foul-smelling discharge from her wound site.  She is recommended by her surgeons to have ongoing wound care, but developed septic shock likely related to left thigh wound with associated acute metabolic encephalopathy on 10/3 for which she currently remains on norepinephrine as well as multiple antibiotics.  Her antibiotics will end on 10/12.  Once blood pressures remained stable even on hemodialysis, femoral catheter can be removed.  Assessment & Plan:   Principal Problem:   Open wound of left thigh Active Problems:   Lymphedema of lower extremity   Diabetes mellitus (HCC)   Hyperkalemia   HTN (hypertension)   HLD (hyperlipidemia)   ESRD on dialysis (HCC)   Lymphedema   Obesity, Class III, BMI 40-49.9 (morbid obesity) (HCC)   Severe sepsis with septic shock (HCC)   Hyponatremia   Hypoalbuminemia due to protein-calorie malnutrition (HCC)   Elevated brain natriuretic peptide (BNP) level   Atrial fibrillation, chronic (HCC)   Pulmonary edema   Prolonged QT interval   Elevated alkaline phosphatase in newborn   Septic shock  secondary to left thigh wound, not POA -She will need follow-up outpatient with Plastic Surgery, Dr. Marla Roe for left thigh wound reevaluation as soon as possible after discharge -But the wound continues to have discharge, pus present,  malodorous odor -In the wound condition, I will continue IV antibiotics - as ordered day 9/10 -Addition of hydrocortisone IV 10/5 -Adjustment of midodrine to 25 mg 3 times daily on 10/9 -Patient is on Levophed as was hypotensive this morning, started on bedside hemodialysis - -Patient has a femoral cath will remain in place until blood pressure stabilizes Note: Upon removal needs a continuous 30-minute pressure as patient is on Eliquis  -Discussed with general surgery at [facility], for evaluation and possible debridement upon arrival    Acute metabolic encephalopathy secondary to above -resolved -Continue to maintain MAP greater than 60   Acute on chronic diastolic congestive heart failure -Echocardiogram with LVEF 60-65% and grade 1 diastolic dysfunction on 07/24/2777 -Volume management per hemodialysis   ESRD on HD MWF -Nephrology following with hemodialysis 09/30/2021, patient remains hypotensive on Levophed -Nephrologist recommended transfer to [facility] ICU for CRRT-   Discussed with nephrology in detail, called [facility] PCCM Dr. Tamala Julian Case was discussed agreed for transfer to [facility] for further CRRT diet, and consultation to general surgery for wound debridement   Hyponatremia -Per nephrology with hemodialysis   Paroxysmal atrial fibrillation -Continues on apixaban -Amiodarone 200 mg p.o. daily   History of chronic hypotension -Remained hypotensive, on Levophed, -Also initiated on midodrine 25 mg 3 times daily as of 10 9     Type 2 diabetes Diet controlled at home with recent hemoglobin A1c 6.9% Levemir 5 units  to be added 10/7 due to steroid-induced hyperglycemia   Morbid obesity -Lifestyle changes outpatient    Weakness/fatigue/deconditioning -PT/OT recommending SNF, but refuses   DVT prophylaxis: Eliquis Code Status: Full Family Communication: Discussed with Mr. Ringgenberg her husband on phone 09/30/2021 -agreed to current plan of care and transfer to [facility] ICU  Disposition Plan:  Status is: Inpatient   Remains inpatient appropriate because:Hemodynamically unstable, IV treatments appropriate due to intensity of illness or inability to take PO, and Inpatient level of care appropriate due to severity of illness   Dispo: The patient is from: Home              Anticipated d/c is to: Home              Patient currently is not medically stable to d/c.              Difficult to place patient No   Nutritional Assessment:   The patient's BMI is: Body mass index is 46.7 kg/m.Marland Kitchen   Seen by dietician.  I agree with the assessment and plan as outlined below:   Nutrition Status:   Skin Assessment:   I have examined the patient's skin and I agree with the wound assessment as performed by the wound care RN as outlined below:   Pressure Injury 07/04/21 Thigh Posterior;Proximal;Right Stage 2 -  Partial thickness loss of dermis presenting as a shallow open injury with a red, pink wound bed without slough. (Active)  07/04/21 1200  Location: Thigh (left thigh)  Location Orientation: Posterior;Proximal;Right  Staging: Stage 2 -  Partial thickness loss of dermis presenting as a shallow open injury with a red, pink wound bed without slough.  Wound Description (Comments):   Present on Admission: No      Consultants:  Nephrology, Dr. Moshe Cipro Vascular surgery and plastic surgery - discussed with EDP   Procedures:  Sutures to left thigh wound placed by EDP on admission   Antimicrobials:  Vancomycin 10/3>> Cefepime 10/3>> Metronidazole 10/3>>  Subjective: Patient seen and evaluated today with no new acute complaints or concerns. No acute concerns or events noted overnight.  She has remained off  norepinephrine with stable blood pressure readings since 930 this morning.  Objective: Vitals:   09/30/21 1045 09/30/21 1100 09/30/21 1115 09/30/21 1133  BP: 98/63 140/69 122/63   Pulse: (!) 105 (!) 105 (!) 104 (!) 50  Resp: _0 Temp:    (!) 97.5 F (36.4 C)  TempSrc:    Oral  SpO2: 98% 96%  98%  Weight:      Height:        Intake/Output Summary (Last 24 hours) at 09/30/2021 1137 Last data filed at 09/30/2021 1100 Gross per 24 hour  Intake 531.9 ml  Output --  Net 531.9 ml   Filed Weights   09/29/21 0416 09/30/21 0351 09/30/21 0941  Weight: 130.3 kg 131.7 kg 130 kg       Physical Exam:   General:  Alert, oriented, cooperative, no distress;   HEENT:  Normocephalic, PERRL, otherwise with in Normal limits   Neuro:  CNII-XII intact. , normal motor and sensation, reflexes intact   Lungs:   Clear to auscultation BL, Respirations unlabored, no wheezes / crackles  Cardio:    S1/S2, RRR, No murmure, No Rubs or Gallops   Abdomen:   Soft, non-tender, bowel sounds active all four quadrants,  no guarding or peritoneal signs.  Muscular skeletal:  Limited exam - in bed, able to  move all 4 extremities, Normal strength,  2+ pulses,  symmetric, No pitting edema  Skin:  Dry, warm to touch, negative for any Rashes,  Wounds: Please see nursing documentation  Pressure Injury 07/04/21 Thigh Posterior;Proximal;Right Stage 2 -  Partial thickness loss of dermis presenting as a shallow open injury with a red, pink wound bed without slough. (Active)  07/04/21 1200  Location: Thigh (left thigh)  Location Orientation: Posterior;Proximal;Right  Staging: Stage 2 -  Partial thickness loss of dermis presenting as a shallow open injury with a red, pink wound bed without slough.  Wound Description (Comments):   Present on Admission: No     Media Information Document Information  Photos    09/21/2021 16:46  Attached To:  Hospital Encounter on 09/17/21   Source Information  [place]  Donnamarie Poag, DO  [facility]         Data Reviewed: I have personally reviewed following labs and imaging studies  CBC: Recent Labs  Lab 09/24/21 0430 09/25/21 0446 09/26/21 0517 09/27/21 0511 09/29/21 0431 09/30/21 0423  WBC 19.0* 19.2* 14.2* 13.6* 15.4* 17.4*  NEUTROABS 17.1* 17.0* 12.3*  --   --   --   HGB 8.7* 9.1* 8.2* 8.9* 9.5* 9.4*  HCT 27.1* 27.3* 25.2* 27.9* 29.5* 29.7*  MCV 91.9 93.2 93.3 94.3 97.4 96.7  PLT 245 255 197 199 254 219   Basic Metabolic Panel: Recent Labs  Lab 09/26/21 0517 09/27/21 0511 09/28/21 0430 09/29/21 0431 09/30/21 0423  NA 133* 131* 130* 132* 130*  131*  K 3.3* 3.5 3.9 4.1 4.6  4.6  CL 93* 92* 91* 93* 93*  93*  CO2 _0 GLUCOSE 229* 171* 159* 192* 141*  140*  BUN 35* 52* 70* 50* 72*  71*  CREATININE 3.12* 3.96* 4.68* 3.63* 4.38*  4.38*  CALCIUM 8.8* 9.1 9.0 8.9 9.1  9.1  MG 1.9 2.1 2.1 2.0 2.1  PHOS 3.8 4.3 4.6 3.8 4.3   GFR: Estimated Creatinine Clearance: 19.7 mL/min (A) (by C-G formula based on SCr of 4.38 mg/dL (H)). Liver Function Tests: Recent Labs  Lab 09/26/21 0517 09/27/21 0511 09/28/21 0430 09/29/21 0431 09/30/21 0423  ALBUMIN 3.1* 3.6 3.4* 3.3* 3.2*   No results for input(s): LIPASE, AMYLASE in the last 168 hours. No results for input(s): AMMONIA in the last 168 hours. Coagulation Profile: Recent Labs  Lab 09/29/21 1126  INR 2.4*    CBG: Recent Labs  Lab 09/29/21 1919 09/29/21 2305 09/30/21 0340 09/30/21 0725 09/30/21 1132  GLUCAP 192* 164* 142* 126* 126*    Recent Results (from the past 240 hour(s))  Culture, blood (routine x 2)     Status: None   Collection Time: 09/21/21 11:24 AM   Specimen: Hemodialysis Fistula; Blood  Result Value Ref Range Status   Specimen Description   Final    HEMODIALYSIS FISTULA BOTTLES DRAWN AEROBIC AND ANAEROBIC   Special Requests Blood Culture adequate volume  Final   Culture   Final    NO GROWTH 5 DAYS Performed at [facility], [address] [place], [place] [address]    Report Status 09/26/2021 FINAL  Final  Culture, blood (routine x 2)     Status: None   Collection Time: 09/21/21 11:24 AM   Specimen: Hemodialysis Fistula; Blood  Result Value Ref Range Status   Specimen Description   Final    HEMODIALYSIS FISTULA BOTTLES DRAWN AEROBIC AND ANAEROBIC   Special Requests Blood Culture adequate volume  Final   Culture   Final    NO GROWTH 5 DAYS Performed at [facility], [address] [place], [place] [address]    Report Status 09/26/2021 FINAL  Final  MRSA Next Gen by PCR, Nasal     Status: None   Collection Time: 09/21/21  9:13 PM   Specimen: Nasal Mucosa; Nasal Swab  Result Value Ref Range Status   MRSA by PCR Next Gen NOT DETECTED NOT DETECTED Final    Comment: (NOTE) The GeneXpert MRSA Assay (FDA approved for NASAL specimens only), is one component of a comprehensive MRSA colonization surveillance program. It is not intended to diagnose MRSA infection nor to guide or monitor treatment for MRSA infections. Test performance is not FDA approved in patients less than 50 years old. Performed at [facility], [address], [place], [place] [address]          Radiology Studies: No results found.      Scheduled Meds:  amiodarone  200 mg Oral Daily   apixaban  5 mg Oral BID   calcitRIOL  0.5 mcg Oral Q M,W,F-HD   calcium acetate  1,334 mg Oral TID with meals   Chlorhexidine Gluconate Cloth  6 each Topical Daily   Chlorhexidine Gluconate Cloth  6 each Topical Q0600   darbepoetin (ARANESP) injection - DIALYSIS  100 mcg Intravenous Q Fri-HD   feeding supplement  237 mL Oral BID BM   hydrocortisone sod succinate (SOLU-CORTEF) inj  100 mg Intravenous Q8H   influenza vac split quadrivalent PF  0.5 mL Intramuscular Tomorrow-1000   insulin aspart  0-15 Units Subcutaneous Q4H   insulin detemir  5 Units Subcutaneous Daily   lanthanum  500 mg Oral TID WC   midodrine  25 mg Oral TID WC    multivitamin  1 tablet Oral QHS   pantoprazole  40 mg Oral BID   sodium chloride flush  10-40 mL Intracatheter Q12H   Continuous Infusions:  sodium chloride     sodium chloride     sodium chloride 10 mL/hr at 09/29/21 1113   ceFEPime (MAXIPIME) IV     norepinephrine (LEVOPHED) Adult infusion 2 mcg/min (09/30/21 1043)   vancomycin       LOS: 13 days    SIGNED: Deatra James, MD, FHM. [facility],  Pager (please use [email or web address] to page/text)  Please use Epic Secure Chat for non-urgent communication (7AM-7PM) If 7PM-7AM, please contact night-coverage [email or web address],  09/30/2021, 11:38 AM  Time spent: 55 minutes

## 2021-09-30 NOTE — Consult Note (Signed)
[place] Nurse wound follow up Wound type:Left medial thigh wound, sepsis ongoing.  Bedside RN notes no change in wound, unsure why new consult was placed.  States patient will be transferred to [facility]. This Probation officer has reached out to plastics team, that has been managing outpatient, for ongoing topical guidance.  Also inquired if [place] team can make recommendations for topical care once patient arrives to [facility] campus from [facility]. Will await further instructions.  Will follow.  Domenic Moras MSN, RN, FNP-BC CWON Wound, Ostomy, Continence Nurse Pager [phone]

## 2021-09-30 NOTE — Progress Notes (Addendum)
Report called to 25M RN, Janett Billow.   *EDIT: patient off unit for transport with [facility] to [facility] at this time.

## 2021-10-01 DIAGNOSIS — R6521 Severe sepsis with septic shock: Secondary | ICD-10-CM | POA: Diagnosis not present

## 2021-10-01 DIAGNOSIS — A419 Sepsis, unspecified organism: Secondary | ICD-10-CM | POA: Diagnosis not present

## 2021-10-01 LAB — CBC
HCT: 29.4 % — ABNORMAL LOW (ref 36.0–46.0)
Hemoglobin: 9.3 g/dL — ABNORMAL LOW (ref 12.0–15.0)
MCH: 30.6 pg (ref 26.0–34.0)
MCHC: 31.6 g/dL (ref 30.0–36.0)
MCV: 96.7 fL (ref 80.0–100.0)
Platelets: 254 10*3/uL (ref 150–400)
RBC: 3.04 MIL/uL — ABNORMAL LOW (ref 3.87–5.11)
RDW: 24.7 % — ABNORMAL HIGH (ref 11.5–15.5)
WBC: 17.6 10*3/uL — ABNORMAL HIGH (ref 4.0–10.5)
nRBC: 0.9 % — ABNORMAL HIGH (ref 0.0–0.2)

## 2021-10-01 LAB — RENAL FUNCTION PANEL
Albumin: 3.2 g/dL — ABNORMAL LOW (ref 3.5–5.0)
Anion gap: 14 (ref 5–15)
BUN: 56 mg/dL — ABNORMAL HIGH (ref 6–20)
CO2: 25 mmol/L (ref 22–32)
Calcium: 9.3 mg/dL (ref 8.9–10.3)
Chloride: 92 mmol/L — ABNORMAL LOW (ref 98–111)
Creatinine, Ser: 3.87 mg/dL — ABNORMAL HIGH (ref 0.44–1.00)
GFR, Estimated: 13 mL/min — ABNORMAL LOW (ref >60)
Glucose, Bld: 151 mg/dL — ABNORMAL HIGH (ref 70–99)
Phosphorus: 4.5 mg/dL (ref 2.5–4.6)
Potassium: 4.6 mmol/L (ref 3.5–5.1)
Sodium: 131 mmol/L — ABNORMAL LOW (ref 135–145)

## 2021-10-01 LAB — GLUCOSE, CAPILLARY
Glucose-Capillary: 116 mg/dL — ABNORMAL HIGH (ref 70–99)
Glucose-Capillary: 180 mg/dL — ABNORMAL HIGH (ref 70–99)
Glucose-Capillary: 170 mg/dL — ABNORMAL HIGH (ref 70–99)
Glucose-Capillary: 139 mg/dL — ABNORMAL HIGH (ref 70–99)
Glucose-Capillary: 220 mg/dL — ABNORMAL HIGH (ref 70–99)

## 2021-10-01 LAB — APTT
aPTT: 127 seconds — ABNORMAL HIGH (ref 24–36)
aPTT: 70 seconds — ABNORMAL HIGH (ref 24–36)

## 2021-10-01 LAB — HEPARIN LEVEL (UNFRACTIONATED): Heparin Unfractionated: >1.1 IU/mL — ABNORMAL HIGH (ref 0.30–0.70)

## 2021-10-01 MED ORDER — HEPARIN (PORCINE) 2000 UNITS/L FOR CRRT: INTRAVENOUS_CENTRAL | Status: DC | PRN

## 2021-10-01 MED ORDER — OXYCODONE HCL 5 MG PO TABS
10.0000 mg | ORAL_TABLET | ORAL | Status: DC | PRN
  Administered 2021-10-01 – 2021-10-03 (×7): 10 mg via ORAL
  Filled 2021-10-01 (×7): qty 2

## 2021-10-01 MED ORDER — HEPARIN SODIUM (PORCINE) 1000 UNIT/ML DIALYSIS: 1000.0000 [IU] | INTRAMUSCULAR | Status: DC | PRN

## 2021-10-01 MED ORDER — HEPARIN SODIUM (PORCINE) 1000 UNIT/ML DIALYSIS
40.0000 [IU]/kg | INTRAMUSCULAR | Status: DC | PRN
  Filled 2021-10-01: qty 6

## 2021-10-01 MED ORDER — DOCUSATE SODIUM 100 MG PO CAPS
100.0000 mg | ORAL_CAPSULE | Freq: Two times a day (BID) | ORAL | Status: DC
  Administered 2021-10-01 – 2021-10-03 (×5): 100 mg via ORAL
  Filled 2021-10-01 (×6): qty 1

## 2021-10-01 MED ORDER — PHENYLEPHRINE HCL-NACL 20-0.9 MG/250ML-% IV SOLN: 0.0000 ug/min | INTRAVENOUS | Status: DC

## 2021-10-01 MED ORDER — VANCOMYCIN HCL IN DEXTROSE 1-5 GM/200ML-% IV SOLN
1000.0000 mg | INTRAVENOUS | Status: DC
  Filled 2021-10-01 (×2): qty 200

## 2021-10-01 MED ORDER — SODIUM CHLORIDE 0.9 % IV SOLN: 2.0000 g | INTRAVENOUS | Status: DC

## 2021-10-01 MED ORDER — PRISMASOL BGK 4/2.5 32-4-2.5 MEQ/L REPLACEMENT SOLN: Status: DC

## 2021-10-01 MED ORDER — GERHARDT'S BUTT CREAM
TOPICAL_CREAM | Freq: Two times a day (BID) | CUTANEOUS | Status: DC
  Filled 2021-10-01: qty 1

## 2021-10-01 MED ORDER — PRISMASOL BGK 4/2.5 32-4-2.5 MEQ/L EC SOLN: Status: DC

## 2021-10-01 MED ORDER — NEPRO/CARBSTEADY PO LIQD
237.0000 mL | Freq: Two times a day (BID) | ORAL | Status: DC
  Administered 2021-10-02 – 2021-10-03 (×4): 237 mL via ORAL

## 2021-10-01 MED ORDER — JUVEN PO PACK
1.0000 | PACK | Freq: Two times a day (BID) | ORAL | Status: DC
  Administered 2021-10-02 – 2021-10-03 (×4): 1 via ORAL
  Filled 2021-10-01 (×4): qty 1

## 2021-10-01 MED ORDER — PHENYLEPHRINE-MINERAL OIL-PET 0.25-14-74.9 % RE OINT
1.0000 "application " | TOPICAL_OINTMENT | Freq: Two times a day (BID) | RECTAL | Status: DC | PRN
  Filled 2021-10-01: qty 57

## 2021-10-01 MED ORDER — VANCOMYCIN HCL IN DEXTROSE 1-5 GM/200ML-% IV SOLN: 1000.0000 mg | INTRAVENOUS | Status: DC

## 2021-10-01 NOTE — Progress Notes (Signed)
[facility] Progress Note Patient Name: [name] DOB: 06-Oct-1966 MRN: [id number]   Date of Service  10/01/2021  HPI/Events of Note  Pt in afib to 140, and beta blocker held due to low MAP  eICU Interventions  Asked RN to give beta blocker and I ordered [name] in case map drops < 65     Intervention Category Intermediate Interventions: Hypotension - evaluation and management  Tilden Dome 10/01/2021, 11:04 PM

## 2021-10-01 NOTE — Progress Notes (Signed)
ANTICOAGULATION CONSULT NOTE - Follow Up Consult  Pharmacy Consult for heparin Indication: atrial fibrillation  Labs: Recent Labs    09/29/21 0431 09/29/21 1126 09/30/21 0423 10/01/21 0532  HGB 9.5*  --  9.4* 9.3*  HCT 29.5*  --  29.7* 29.4*  PLT 254  --  243 254  APTT  --   --   --  127*  LABPROT  --  26.5*  --   --   INR  --  2.4*  --   --   HEPARINUNFRC  --   --   --  >1.10*  CREATININE 3.63*  --  4.38*  4.38* 3.87*    Assessment: 55yo female supratherapeutic on heparin with initial dosing while Eliquis on hold; no infusion issues or signs of bleeding per RN.  Goal of Therapy:  aPTT 66-102 seconds   Plan:  Will decrease heparin infusion by 2 units/kgABW/hr to 1100 units/hr and check level in 8 hours.    Wynona Neat, PharmD, BCPS  10/01/2021,6:33 AM

## 2021-10-01 NOTE — Progress Notes (Signed)
Initial Nutrition Assessment  DOCUMENTATION CODES:   Not applicable  INTERVENTION:   Renal MVI daily  1 packet Juven BID, each packet provides 95 calories, 2.5 grams of protein (collagen), and 9.8 grams of carbohydrate (3 grams sugar); also contains 7 grams of L-arginine and L-glutamine, 300 mg vitamin C, 15 mg vitamin E, 1.2 mcg vitamin B-12, 9.5 mg zinc, 200 mg calcium, and 1.5 g  Calcium Beta-hydroxy-Beta-methylbutyrate to support wound healing  Nepro Shake po BID, each supplement provides 425 kcal and 19 grams protein  Recommend ordering Vitamin C, D, B12, and zinc labs. Received okay from MD.  Encourage good PO intake  Recommend a daily stool softener.   NUTRITION DIAGNOSIS:   Increased nutrient needs related to wound healing, chronic illness (ESRD on HD) as evidenced by estimated needs.  GOAL:   Patient will meet greater than or equal to 90% of their needs  MONITOR:   PO intake, Supplement acceptance, Weight trends, Skin, I & O's  REASON FOR ASSESSMENT:   LOS    ASSESSMENT:   55 y.o. female presented to [facility] ED om 9/29 for worsening wound on L thigh and missed dialysis sessions. PMH includes CHF, ESRD on HD MWF, COPD, T2DM, lymphedema, and HTN. Pt was recently hospitilized 7/3-9/5 for the current L thigh wound, recommend pt continue care at SNF, but pt preferred d/c to home. Pt readmitted for wound evaluation and monitoring.   10/3: Found to be septic 10/12: Transferred to [facility] for CRRT   Spoke with RN about pt. Pt has been on and off pressors, currently off. The current plan is to continue with intermittent HD, hold on CRRT at this time.  Plastic surgery following for wound care, no plan for OR this week for debridement.   Pt reports that her appetite has been on and off for weeks. Pt reports her poor PO intake is often related to nausea. Pt's chair time for dialysis is 5:15 am, states that she often eats breakfast before of fruit and boiled egg. Pt will have  a snack at dialysis, often a protein bar that they provide, stated that she does not like it. Her lunch often is a  1/2 [place] or tuna sandwich with fruit and dinner is whatever her daughter makes, which can vary.   Pt reports that when at [facility], she was consuming <50% of her meals due to foods being difficult to eat. Pt does have denture, but they were being fixed and then she came to the hospital. Pt stated that she does not use dentures while eating at baseline.  Per EMR, pt intake includes: 10/8: Breakfast 100%, Lunch 75%, Dinner 25% 10/10: Dinner 50% 10/12: Lunch 75% 10/13: Breakfast 50%  Pt reports that she has a very close relationship with the RD at dialysis, she talks with her everyday. Pt reports good compliance with phosphorus binders and take them everyday with meals and snacks.   Discussed ONS with pt, pt like Ensure and Nepro. Discussed benefits from the use of Juven with wound healing, pt agreeable. Will switch pt to Nepro and add Juven.  Pt reports that her dry weight is 128-134 kg and they often pull 3-4 L at each HD session. Although, pt states that she does not often retain a large amount of fluid between sessions. Per Nephrology's note, pts estimated dry weight is 122.5 kg. Pt participates in dialysis at [place].  Per EMR, pt has had a 5% weight loss in 3 months, unable to determine  if weight loss is related to fluid retention versus actual dry weight loss. Pt reports that she uses a electric wheelchair at home and unable to ambulate on her own.   Pt reports that she often does not have a bowel movement for 2-3 days. Stated that she has to take a stool softener at home. Pt with no bowel regimen ordered; shared with MD pt's need.  Medications reviewed and include: Rocaltrol, Phoslo, Aranesp, Solu-Cortef, Renal MVI, SSI 0-15 units q4h, Levemir 5 units, Protonix, Fosrenol, Heparin, IV antibiotics (MWF) Labs reviewed: Sodium 131, 24 hr BG trends 116-180  Admission  Weight: 125.2 kg Current Weight: 127.7 kg  HD on 10/13 Net UF: 2700 mL  NUTRITION - FOCUSED PHYSICAL EXAM:  Flowsheet Row Most Recent Value  Orbital Region Mild depletion  Upper Arm Region No depletion  Thoracic and Lumbar Region No depletion  Buccal Region Mild depletion  Temple Region Mild depletion  Clavicle Bone Region No depletion  Clavicle and Acromion Bone Region No depletion  Scapular Bone Region No depletion  Dorsal Hand No depletion  Patellar Region Unable to assess  [Lymphedma BLE]  Anterior Thigh Region Unable to assess  Posterior Calf Region Unable to assess  Edema (RD Assessment) Unable to assess  [Lymphedma BLE]  Hair Reviewed  Eyes Reviewed  Mouth Reviewed  Skin Reviewed  Nails Reviewed       Diet Order:   Diet Order             Diet renal/carb modified with fluid restriction Diet-HS Snack? Nothing; Fluid restriction: 1200 mL Fluid; Room service appropriate? Yes; Fluid consistency: Thin  Diet effective now                   EDUCATION NEEDS:   No education needs have been identified at this time  Skin:  Skin Assessment: Skin Integrity Issues: Skin Integrity Issues:: Unstageable, Stage II Stage II: Sacrum Unstageable: L Thigh  Last BM:  09/28/2021  Height:   Ht Readings from Last 1 Encounters:  09/30/21 5' 5" (1.651 m)    Weight:   Wt Readings from Last 1 Encounters:  10/01/21 127.8 kg    Ideal Body Weight:  56.8 kg  BMI:  Body mass index is 46.89 kg/m.  Estimated Nutritional Needs:   Kcal:  2250-2450  Protein:  115-130 grams  Fluid:  UOP + 1000 mL    Hermina Barters BS, PLDN Clinical Dietitian See [facility] for contact information.

## 2021-10-01 NOTE — Progress Notes (Signed)
NAME:  [name], MRN:  [id number], DOB:  05-21-1966, LOS: 4 ADMISSION DATE:  09/17/2021, CONSULTATION DATE:  10/12 REFERRING MD:  shahmehdi, CHIEF COMPLAINT:  wound eval and sepsis    History of Present Illness:  55 year old female with multiple medical problems.  Admitted from home initially to [facility] on 9/29 with chief complaint of worsening left thigh wound.  Discharged 9/5 after prolonged hospital stay for infected left thigh wound with wound VAC, which she refused. Actually presented to the ER 9/9 at advice of wound care nurse for concern about infection.  Was discharged with recommendations to continue current wound care.  She was supposed to see plastic surgery on 9/27 however presented to the ER once again at the recommendation of her wound care nurse for evaluation of left thigh wound with concern about worsening infection.  In ER she was noted to be negative for fever chills shortness of breath.  She had missed 2 doses of dialysis prior to presentation.  Plastic surgery was consulted in the ER and recommended removing wound VAC, wound this was removed she had superficial venous bleeding due to rupture of superficial vein which was controlled with direct pressure, sutures, and compression wrap.  Vascular surgery was consulted and recommended packing the wound and applying compression wrap.  She was initially admitted for evaluation and monitoring of this wound. Hospital course 9/29 through 10/12: Admitted.  Nephrology consulted and assisted with dialysis.  Volume status managed through dialysis with volume removal.  Wound managed with Xeroform dressings changed daily initially.  Initial hospital course complicated significantly by difficult to control electrolyte disturbances. 10/3: White blood cell increased from 9.9-29.1, new acute metabolic encephalopathy, new SIRS/sepsis which was not present on admission.  She was hypotensive.  Transferred to the intensive care and  started on norepinephrine.  Blood cultures were sent. Vanc and cefepime started. WOC re-consulted: no change in rx advised. 10/4 right femoral CVL placed by surgery. 10/5 added hydrocortisone for stress dose. Still pressor dependent but mental status better. Midodrine increased 10/6, still pressor dependent so midodrine increased to 56m tid, 10/11 off pressors. 10/12 back on pressors. Concern for on-going wound infection. Surgery consulted to [facility]. [facility] for [facility] evaluation and possible need for CRRT>  Pertinent  Medical History   Infected left thigh wound hospitalized from 7/3 through 9/5 (64 days) required incision and drainage, plastic surgery consultation and wound VAC.  Hospital course complicated by septic shock and heart failure .  HTN, ESRD (MWF), HFpEF/ chronic, COPD, type II dm, chronic Lymphedema of lower extremities,  MO (class III BMI 49.9)  HLD, atrial fib, anxiety, Chronic pain, Prior respiratory arrest after anesthesia at age 89 CAD last cath 2010. OSA  Significant Hospital Events: Including procedures, antibiotic start and stop dates in addition to other pertinent events   9/29 admitted w/ concern for infected left thigh concern. Not clearly infected BUT did have bleeding on removal of wound VAC req 'd pressure and suture. Plastic surg recommended dc wound vac and daily change 10/4 developed shock and encephalopathy. CT thigh. Soft tissue wound along the anteromedial distal thigh with surrounding skin thickening and underlying subcutaneous edema. No fluid collection or subcutaneous emphysema.Started on vanc and cefepime. Right femoral CVL placed.  10/5 hydrocortisone started 10/6, still pressor dependent so midodrine increased to 218mtid 10/11 off pressors 10/12 back on pressors. [facility] for surgical eval and possible need for CRRT   Interim History /  Subjective:  10/13: transferred from [facility] for crrt after hypotension presumably  2/2 cellulitic wound that has been ongoing for some time now. However pt is off the pressors at this time. Will try iHD again. Reached out to plastics awaiting official consult. Also will get repeat blood cx and pull lilne placed from osh.  Objective   Blood pressure 102/77, pulse 89, temperature (!) 97.4 F (36.3 C), temperature source Oral, resp. rate 11, height 5' 5" (1.651 m), weight 127.7 kg, SpO2 99 %.        Intake/Output Summary (Last 24 hours) at 10/01/2021 0926 Last data filed at 10/01/2021 0600 Gross per 24 hour  Intake 394.04 ml  Output 2700 ml  Net -2305.96 ml   Filed Weights   09/30/21 1326 09/30/21 1800 10/01/21 0245  Weight: 127.7 kg 127.7 kg 127.7 kg    Examination: General: 55 year old female she is resting in bed, appears comfortable, L LE with foot in bandage and medial thigh HENT: NCAT, no JVD MMM Lungs: clear no accessory use. Currently on 2 lpm Cardiovascular: AF w/ CVR Abdomen: soft not tender + bowel sounds  Extremities: left LE wound foul smelling w/ frank purulent drainage.  Neuro: awake and oriented GU: anuric   Resolved Hospital Problem list   Septic shock Acute metabolic encephalopathy  Assessment & Plan:  Undifferentiated shock (since 10/4, was initially sepsis/septic shock from the left thigh.). Its not clear to me at this point if this reflects ongoing infection/sepsis or simply the patient being intolerant of dialysis.  Plan Remove cvc Day 10 vanc and cefepime  -repeat blood cx.  Keep euvolemic Continue stress dose steroids Continue midodrine It appears as though wound ostomy team is already reached out to plastics, I have left message, also spoke to general surgery, general surgery happy to consult on 10/13 if plastics unable to assist ( 10/13 attempted to reach plastics again without response. Spoke with Claiborne Billings Gen surgery PA who will attempt to reach them as well ) -removing fem cvc  Chronic left medial thigh wound. Plan Continue  daily dressings as directed by W OC, further guidance as instructed by surgery and or plastics Holding DOAC (apixaban in anticipation of possible debridement need)  chronic diastolic heart failure -Has not had a portable chest x-ray since 10/3, is on minimal supplemental oxygen Plan Continue telemetry -volume removal as tolerated with iHD  History of paroxysmal atrial fibrillation Plan  continue apixaban and amiodarone (hold apixaban given need for dbridement)   End-stage renal disease, typically on dialysis Monday/Wednesday/Friday.  Having difficulty tolerating intermittent dialysis with ongoing hypotension, and volume overload Plan -appreciate nephrology Strict intake output  Fluid electrolyte imbalance in the setting of end-stage renal disease Plan Continue to monitor Correct as indicated  Anemia of end-stage renal disease without evidence of bleeding Plan Trend CBC Transfusion trigger less than 7  Type 2 diabetes Plan Sliding scale insulin     Best Practice (right click and "Reselect all SmartList Selections" daily)   Diet/type: Regular consistency (see orders) DVT prophylaxis: DOAC GI prophylaxis: PPI Lines: Central line and yes and it is still needed Foley:  N/A Code Status:  full code Last date of multidisciplinary goals of care discussion [with pt 10/13 ]  Labs   CBC: Recent Labs  Lab 09/25/21 0446 09/26/21 0517 09/27/21 0511 09/29/21 0431 09/30/21 0423 10/01/21 0532  WBC 19.2* 14.2* 13.6* 15.4* 17.4* 17.6*  NEUTROABS 17.0* 12.3*  --   --   --   --  HGB 9.1* 8.2* 8.9* 9.5* 9.4* 9.3*  HCT 27.3* 25.2* 27.9* 29.5* 29.7* 29.4*  MCV 93.2 93.3 94.3 97.4 96.7 96.7  PLT 255 197 199 254 243 163    Basic Metabolic Panel: Recent Labs  Lab 09/26/21 0517 09/27/21 0511 09/28/21 0430 09/29/21 0431 09/30/21 0423 10/01/21 0532  NA 133* 131* 130* 132* 130*  131* 131*  K 3.3* 3.5 3.9 4.1 4.6  4.6 4.6  CL 93* 92* 91* 93* 93*  93* 92*  CO2 _0 GLUCOSE 229* 171* 159* 192* 141*  140* 151*  BUN 35* 52* 70* 50* 72*  71* 56*  CREATININE 3.12* 3.96* 4.68* 3.63* 4.38*  4.38* 3.87*  CALCIUM 8.8* 9.1 9.0 8.9 9.1  9.1 9.3  MG 1.9 2.1 2.1 2.0 2.1  --   PHOS 3.8 4.3 4.6 3.8 4.3 4.5   GFR: Estimated Creatinine Clearance: 22.1 mL/min (A) (by C-G formula based on SCr of 3.87 mg/dL (H)). Recent Labs  Lab 09/27/21 0511 09/29/21 0431 09/30/21 0423 10/01/21 0532  WBC 13.6* 15.4* 17.4* 17.6*    Liver Function Tests: Recent Labs  Lab 09/27/21 0511 09/28/21 0430 09/29/21 0431 09/30/21 0423 10/01/21 0532  ALBUMIN 3.6 3.4* 3.3* 3.2* 3.2*   No results for input(s): LIPASE, AMYLASE in the last 168 hours. No results for input(s): AMMONIA in the last 168 hours.  ABG    Component Value Date/Time   PHART 7.299 (L) 06/22/2021 1414   PCO2ART 47.5 06/22/2021 1414   PO2ART 89 06/22/2021 1414   HCO3 28.8 (H) 07/02/2021 1321   TCO2 31 07/02/2021 1321   ACIDBASEDEF 3.0 (H) 06/22/2021 1414   O2SAT 53.0 07/02/2021 1321     Coagulation Profile: Recent Labs  Lab 09/29/21 1126  INR 2.4*    Cardiac Enzymes: No results for input(s): CKTOTAL, CKMB, CKMBINDEX, TROPONINI in the last 168 hours.  HbA1C: Hgb A1c MFr Bld  Date/Time Value Ref Range Status  09/18/2021 06:20 AM 6.8 (H) 4.8 - 5.6 % Final    Comment:    (NOTE) Pre diabetes:          5.7%-6.4%  Diabetes:              >6.4%  Glycemic control for   <7.0% adults with diabetes   06/22/2021 04:54 AM 6.9 (H) 4.8 - 5.6 % Final    Comment:    (NOTE) Pre diabetes:          5.7%-6.4%  Diabetes:              >6.4%  Glycemic control for   <7.0% adults with diabetes     CBG: Recent Labs  Lab 09/30/21 1622 09/30/21 1942 09/30/21 2316 10/01/21 0342 10/01/21 0811  GLUCAP 156* 156* 147* 116* 180*   Critical care time: The patient is critically ill with multiple organ systems failure and requires high complexity decision making for assessment and support,  frequent evaluation and titration of therapies, application of advanced monitoring technologies and extensive interpretation of multiple databases.  Critical care time 35 mins. This represents my time independent of the NPs time taking care of the pt. This is excluding procedures.    Audria Nine DO [place] 10/01/2021, 9:26 AM See Amion for pager If no response to pager, please call [phone] until 1900 After 1900 please call [facility] [phone]

## 2021-10-01 NOTE — Progress Notes (Signed)
ANTICOAGULATION CONSULT NOTE - Follow Up Consult  Pharmacy Consult for IV Heparin Indication: atrial fibrillation  Allergies  Allergen Reactions   Contrast Media [Iodinated Diagnostic Agents] Anaphylaxis, Hives, Swelling and Other (See Comments)    Dye for cardiac cath. Tongue swells   Pneumococcal Vaccines Swelling and Other (See Comments)    Turns skin black, and bodily swelling   Vancomycin Nausea And Vomiting and Other (See Comments)    Infusion "made me feel like I was dying" had to be readmitted to hospital    Patient Measurements: Height: _0  (165.1 cm) Weight: 127.8 kg (281 lb 12 oz) IBW/kg (Calculated) : 57 Heparin Dosing Weight: 88 kg  Vital Signs: Temp: 97.8 F (36.6 C) (10/13 1519) Temp Source: Oral (10/13 1120) BP: 88/56 (10/13 1630) Pulse Rate: 95 (10/13 1230)  Labs: Recent Labs    09/29/21 0431 09/29/21 1126 09/30/21 0423 10/01/21 0532 10/01/21 1553  HGB 9.5*  --  9.4* 9.3*  --   HCT 29.5*  --  29.7* 29.4*  --   PLT 254  --  243 254  --   APTT  --   --   --  127* 70*  LABPROT  --  26.5*  --   --   --   INR  --  2.4*  --   --   --   HEPARINUNFRC  --   --   --  >1.10*  --   CREATININE 3.63*  --  4.38*  4.38* 3.87*  --     Estimated Creatinine Clearance: 22.1 mL/min (A) (by C-G formula based on SCr of 3.87 mg/dL (H)).  Medical History: Past Medical History:  Diagnosis Date   Allergy    Phreesia 11/04/2020   Anemia, unspecified    Anxiety    Arthritis    Phreesia 11/04/2020   Asthma    as a child   Cancer ([place])    thyroid   Cellulitis    CHF (congestive heart failure) ([place])    Chiari malformation    s/p surgery   Chiari malformation    Chronic kidney disease    Phreesia 11/04/2020   Chronic kidney disease, stage 5 (HCC)    Chronic obstructive pulmonary disease, unspecified ([place])    Chronic pain    Complication of anesthesia 11/28/15   Resp arrest after  conscious  sedation   Compression of brain (HCC)    Constipation    COPD  (chronic obstructive pulmonary disease) ([place])    Phreesia 11/04/2020   Coronary artery disease    40-50% mid LAD 04/29/09, Medical tx. (Dr. Gwenlyn Found)   Diabetes mellitus    Type II- reports being off all medication d/t it being controlled   Diabetes mellitus without complication ([place])    Phreesia 11/04/2020   Essential (primary) hypertension    Fibromyalgia    Fibromyalgia    History of blood transfusion    hemorrage duinrg pregancy   Hx of echocardiogram 10/2011   EF 55-60%   Hypercholesterolemia    Hyperlipidemia, unspecified    Hypertension    Hypertensive chronic kidney disease with stage 1 through stage 4 chronic kidney disease, or unspecified chronic kidney disease    Hypertensive chronic kidney disease with stage 5 chronic kidney disease or end stage renal disease (HCC)    Lymph edema    Lymphedema, not elsewhere classified    Morbid (severe) obesity due to excess calories (HCC)    Obesity hypoventilation syndrome ([place])    On home oxygen therapy    "  2L; 24/7" (12/21/2018)   Peritonitis, unspecified ([place])    Pneumonia    in past   Pulmonary hypertension ([place])    Renal disorder    M/W/F [facility] Pt started dialysis in Dec.2016   S/P colonoscopy 05/26/2007   Dr. Laural Golden sigmoid diverticulosis random biopsies benign   S/P endoscopy 05/01/2009   Dr. Penelope Coop pill-induced esophageal ulcerations distal to midesophagus, 2 small ulcers in the antrum of the stomach   Shortness of breath dyspnea    with any exertion or if heart rate  is irregular while on dialysis   Sleep apnea    reports that she no longer needs CPAP due to weight loss   Sleep apnea, unspecified    Type 2 diabetes mellitus with diabetic nephropathy ([place])    Type 2 diabetes mellitus with hyperglycemia [facility])     Assessment: 55 yr old female admitted on 09/17/2021 with worsening L thigh wound, with PMH of ESRD on HD MWF and Afib. Patient was transferred from [facility] to [facility] for surgical consult and  possible need for CRRT. Patient was on apixaban prior to admission for Afib and continued while inpatient (last dose apixaban 5 mg was given on 10/12 at ~0930). Pharmacy was consulted to dose IV heparin for Afib while apixaban on hold for possible procedure.  Given recent apixaban exposure, will monitor anticoagulation using aPTT until aPTT and heparin levels correlate.  aPTT ~9.5 hrs after heparin infusion was decreased to 1100 units/hr was 70 sec, which is within the goal range for this pt. H/H 9.3/29.4, plt 254 (CBC stable). Per HD RN, no issues with IV or bleeding observed.  Goal of Therapy:  Heparin level 0.3-0.7 units/ml aPTT 66-102 seconds Monitor platelets by anticoagulation protocol: Yes   Plan:  Continue heparin infusion at 1100 units/hr Check confirmatory aPTT in 8 hrs Monitor daily aPTT, heparin level, CBC Monitor for bleeding  Gillermina Hu, PharmD, BCPS, [facility] Clinical Pharmacist 10/01/2021 4:57 PM

## 2021-10-01 NOTE — Progress Notes (Signed)
Orthopedic Tech Progress Note Patient Details:  [name] 02-06-1966 [id number]  Ortho Devices Type of Ortho Device: Haematologist Ortho Device/Splint Location: Bilateral Ortho Device/Splint Interventions: Application, Ordered   Post Interventions Patient Tolerated: Well  [name] A Gerri Acre 10/01/2021, 1:11 PM

## 2021-10-01 NOTE — Progress Notes (Signed)
[place] Progress Note Patient Name: [name] DOB: 08-29-1966 MRN: [id number]   Date of Service  10/01/2021  HPI/Events of Note  Pt having bleeding from known external hemrrhoids.  Not high volumes.  No change in hemodynamics  eICU Interventions  Continue heparin gtt.  Serial H/H.  Monitor MAP, cardiac rhythm, and for signs of hypoperfusion     Intervention Category Minor Interventions: Other:  Tilden Dome 10/01/2021, 11:59 PM

## 2021-10-01 NOTE — Consult Note (Addendum)
[place] Nurse Consult Note: Reason for Consult: Left thigh chronic nonhealing wound.  Surgery consult pending for debridement.  Patient was previously using NPWT (VAC) therapy at home but this was discontinued due to bleeding.   She does not wish to start his back.  I inform her that infection could have made the bleeding and pain worse and that, once debrided, the wound may bleed less and pain be more manageable.   Stage 2 pressure injury to sacrum, moisture and pressure  Wound type:infectious/nonhealing  Pressure Injury POA: Yes Measurement:  11 cm x 9 cm wound bed is now 90% black and yellow devitalized tissue Sacrum 3 nonintact lesions, 0.3 cm each.  Wound bed: leg, 90% slough/eschar Sacral wounds 100% pink and moist Drainage (amount, consistency, odor)  moderate creamy purulence  necrotic odor.  Periwound: Edema to lower legs.  Wraps in place to lower legs, kerlix and ace wrap.  Will implement weekly Unna boots.  Dressing procedure/placement/frequency: Continue Xeroform gauze to left thigh wound, surgery pending.  Sacral wound:  cleanse wounds with NS and pat dry. Apply Gerhardts butt paste twice daily and PRN soilage.   Patient on low air loss mattress.  No disposable briefs or underpads.   Will not follow at this time.  Please re-consult if needed.  Domenic Moras MSN, RN, FNP-BC CWON Wound, Ostomy, Continence Nurse Pager [phone]

## 2021-10-01 NOTE — Progress Notes (Signed)
Subjective:    Patient ID: [name], female    DOB: June 13, 1966, 55 y.o.   MRN: [id number]  The patient is a 55 yrs old female here for treatment of her multiple medical issues.  She was seen by [place] in august for a left thigh wound.  She underwent debridement with placement of Myriad.  The area looks better.  The dark area in the wound bed my be dried myriad.  The base is showing signs of improvement.  She had bleeding issue and according to the reports, not following the recommend dressing plan.  The wound does not look infected.   The wound is ~ 9 x 10.5 cm.  Currently being treated with xeroform.     Review of Systems  Constitutional:  Positive for activity change.  Eyes: Negative.   Respiratory: Negative.    Endocrine: Negative.   Genitourinary: Negative.   Psychiatric/Behavioral: Negative.        Objective:   Physical Exam Vitals and nursing note reviewed.  Constitutional:      Appearance: Normal appearance.  Cardiovascular:     Pulses: Normal pulses.  Pulmonary:     Effort: Pulmonary effort is normal.  Musculoskeletal:        General: Tenderness present.     Left lower leg: No tenderness. Edema present.       Legs:  Skin:    Findings: Bruising present.  Neurological:     Mental Status: She is alert and oriented to person, place, and time.  Psychiatric:        Mood and Affect: Mood normal.        Behavior: Behavior normal.      Assessment & Plan:     ICD-10-CM   1. Dialysis patient, noncompliant ([facility])  Z91.15     2. Visit for wound check  Z51.89 DG FEMUR MIN 2 VIEWS LEFT    DG FEMUR MIN 2 VIEWS LEFT    3. Sepsis ([facility])  A41.9 DG CHEST PORT 1 VIEW    DG CHEST PORT 1 VIEW      Recommend [place]/surgical gel to the area twice daily to hydrate the wound.  Not surgery planned for this week.

## 2021-10-01 NOTE — Progress Notes (Addendum)
Patient HR elevated, states she is in a lot of pain, patient had a medium-sized hard stool with red streaks and dark blood in bedpan, called eLink to notify of no available PRN meds at this moment and of stool. Patient also had a 4 beat run of Vtach at 21:30:41. Waiting for orders. Will continue to monitor closely.

## 2021-10-01 NOTE — Progress Notes (Signed)
Patient ID: [name], female   DOB: 07-16-1966, 55 y.o.   MRN: [id number] [facility] Progress Note   Assessment/ Plan:   1.  Left thigh wound with septic shock: Being teed up for surgical debridement and currently on intravenous vancomycin, cefepime and metronidazole with negative blood cultures.  Hypotension has been limiting to ultrafiltration and her volume status/lymphedema continues to impair wound healing. 2.  End-stage renal disease: On hemodialysis on MWF schedule with episodes of intradialytic hypotension that have been limiting to ultrafiltration/volume unloading necessary for wound healing/management.  Transferred to [facility] from [place] for CRRT-orders placed. 3.  Chronic hypotension: Compounded by septic shock, continue midodrine and monitor with UF on CRRT. 4.  Anemia of chronic kidney disease: Compounded by ESA resistance from wound healing, continue ESA and transfuse with PRBCs when hemoglobin <7. 5.  Chronic lymphedema/chronic diastolic heart failure  Subjective:   Completed truncated hemodialysis treatment yesterday with net ultrafiltration of 2.5 L.  Other than for discomfort over thigh wound, she does not have any complaints at this time.   Objective:   BP 102/77   Pulse 89   Temp 97.6 F (36.4 C) (Oral)   Resp 11   Ht _0  (1.651 m)   Wt 127.7 kg   SpO2 99%   BMI 46.85 kg/m   Intake/Output Summary (Last 24 hours) at 10/01/2021 0973 Last data filed at 10/01/2021 0600 Gross per 24 hour  Intake 394.04 ml  Output 2700 ml  Net -2305.96 ml   Weight change: -1.7 kg  Physical Exam: Gen: Appears comfortable sitting up in bed, working on breakfast CVS: Pulse regular rhythm, normal rate, S1 and S2 normal Resp: Diminished breath sounds over bases, no rales/rhonchi Abd: Soft, obese, nontender Ext: Left thigh dressing clean, dry and intact.  Left radiocephalic fistula with intact dressings.  Right thigh femoral line.  Legs in Unna boot.  Imaging: No  results found.  Labs: BMET Recent Labs  Lab 09/25/21 0446 09/26/21 0517 09/27/21 0511 09/28/21 0430 09/29/21 0431 09/30/21 0423 10/01/21 0532  NA 129* 133* 131* 130* 132* 130*  131* 131*  K 3.5 3.3* 3.5 3.9 4.1 4.6  4.6 4.6  CL 91* 93* 92* 91* 93* 93*  93* 92*  CO2 _1 GLUCOSE 180* 229* 171* 159* 192* 141*  140* 151*  BUN 51* 35* 52* 70* 50* 72*  71* 56*  CREATININE 4.40* 3.12* 3.96* 4.68* 3.63* 4.38*  4.38* 3.87*  CALCIUM 8.8* 8.8* 9.1 9.0 8.9 9.1  9.1 9.3  PHOS 4.2 3.8 4.3 4.6 3.8 4.3 4.5   CBC Recent Labs  Lab 09/25/21 0446 09/26/21 0517 09/27/21 0511 09/29/21 0431 09/30/21 0423 10/01/21 0532  WBC 19.2* 14.2* 13.6* 15.4* 17.4* 17.6*  NEUTROABS 17.0* 12.3*  --   --   --   --   HGB 9.1* 8.2* 8.9* 9.5* 9.4* 9.3*  HCT 27.3* 25.2* 27.9* 29.5* 29.7* 29.4*  MCV 93.2 93.3 94.3 97.4 96.7 96.7  PLT 255 197 199 254 243 254    Medications:     amiodarone  200 mg Oral Daily   calcitRIOL  0.5 mcg Oral Q M,W,F-HD   calcium acetate  1,334 mg Oral TID with meals   Chlorhexidine Gluconate Cloth  6 each Topical Q0600   darbepoetin (ARANESP) injection - DIALYSIS  100 mcg Intravenous Q Fri-HD   feeding supplement  237 mL Oral BID BM   hydrocortisone sod succinate (SOLU-CORTEF) inj  100 mg  Intravenous Q8H   influenza vac split quadrivalent PF  0.5 mL Intramuscular Tomorrow-1000   insulin aspart  0-15 Units Subcutaneous Q4H   insulin detemir  5 Units Subcutaneous Daily   lanthanum  500 mg Oral TID WC   midodrine  25 mg Oral TID WC   multivitamin  1 tablet Oral QHS   pantoprazole  40 mg Oral BID   sodium chloride flush  10-40 mL Intracatheter Q12H   vancomycin variable dose per unstable renal function (pharmacist dosing)   Does not apply See admin instructions    Elmarie Shiley, MD 10/01/2021, 8:08 AM

## 2021-10-02 ENCOUNTER — Inpatient Hospital Stay (HOSPITAL_COMMUNITY): Payer: 59

## 2021-10-02 DIAGNOSIS — I482 Chronic atrial fibrillation, unspecified: Secondary | ICD-10-CM | POA: Diagnosis not present

## 2021-10-02 DIAGNOSIS — I10 Essential (primary) hypertension: Secondary | ICD-10-CM | POA: Diagnosis not present

## 2021-10-02 DIAGNOSIS — E0800 Diabetes mellitus due to underlying condition with hyperosmolarity without nonketotic hyperglycemic-hyperosmolar coma (NKHHC): Secondary | ICD-10-CM | POA: Diagnosis not present

## 2021-10-02 DIAGNOSIS — N186 End stage renal disease: Secondary | ICD-10-CM | POA: Diagnosis not present

## 2021-10-02 DIAGNOSIS — R579 Shock, unspecified: Secondary | ICD-10-CM | POA: Diagnosis not present

## 2021-10-02 LAB — GLUCOSE, CAPILLARY
Glucose-Capillary: 157 mg/dL — ABNORMAL HIGH (ref 70–99)
Glucose-Capillary: 167 mg/dL — ABNORMAL HIGH (ref 70–99)
Glucose-Capillary: 183 mg/dL — ABNORMAL HIGH (ref 70–99)
Glucose-Capillary: 194 mg/dL — ABNORMAL HIGH (ref 70–99)
Glucose-Capillary: 153 mg/dL — ABNORMAL HIGH (ref 70–99)
Glucose-Capillary: 131 mg/dL — ABNORMAL HIGH (ref 70–99)

## 2021-10-02 LAB — BLOOD CULTURE ID PANEL (REFLEXED) - BCID2

## 2021-10-02 LAB — CBC
HCT: 28.4 % — ABNORMAL LOW (ref 36.0–46.0)
Hemoglobin: 9.3 g/dL — ABNORMAL LOW (ref 12.0–15.0)
MCH: 31.2 pg (ref 26.0–34.0)
MCHC: 32.7 g/dL (ref 30.0–36.0)
MCV: 95.3 fL (ref 80.0–100.0)
Platelets: 213 10*3/uL (ref 150–400)
RBC: 2.98 MIL/uL — ABNORMAL LOW (ref 3.87–5.11)
RDW: 24.7 % — ABNORMAL HIGH (ref 11.5–15.5)
WBC: 24.6 10*3/uL — ABNORMAL HIGH (ref 4.0–10.5)
nRBC: 0.6 % — ABNORMAL HIGH (ref 0.0–0.2)

## 2021-10-02 LAB — HEMOGLOBIN AND HEMATOCRIT, BLOOD
HCT: 29.9 % — ABNORMAL LOW (ref 36.0–46.0)
Hemoglobin: 9.4 g/dL — ABNORMAL LOW (ref 12.0–15.0)

## 2021-10-02 LAB — RENAL FUNCTION PANEL
Albumin: 3.1 g/dL — ABNORMAL LOW (ref 3.5–5.0)
Anion gap: 11 (ref 5–15)
BUN: 46 mg/dL — ABNORMAL HIGH (ref 6–20)
CO2: 27 mmol/L (ref 22–32)
Calcium: 9.2 mg/dL (ref 8.9–10.3)
Chloride: 94 mmol/L — ABNORMAL LOW (ref 98–111)
Creatinine, Ser: 3.18 mg/dL — ABNORMAL HIGH (ref 0.44–1.00)
GFR, Estimated: 17 mL/min — ABNORMAL LOW (ref >60)
Glucose, Bld: 157 mg/dL — ABNORMAL HIGH (ref 70–99)
Phosphorus: 3.9 mg/dL (ref 2.5–4.6)
Potassium: 4 mmol/L (ref 3.5–5.1)
Sodium: 132 mmol/L — ABNORMAL LOW (ref 135–145)

## 2021-10-02 LAB — APTT: aPTT: 81 seconds — ABNORMAL HIGH (ref 24–36)

## 2021-10-02 LAB — VITAMIN B12: Vitamin B-12: 1046 pg/mL — ABNORMAL HIGH (ref 180–914)

## 2021-10-02 LAB — HEPARIN LEVEL (UNFRACTIONATED): Heparin Unfractionated: >1.1 IU/mL — ABNORMAL HIGH (ref 0.30–0.70)

## 2021-10-02 LAB — VITAMIN D 25 HYDROXY (VIT D DEFICIENCY, FRACTURES): Vit D, 25-Hydroxy: 25.01 ng/mL — ABNORMAL LOW (ref 30–100)

## 2021-10-02 MED ORDER — VANCOMYCIN HCL IN DEXTROSE 1-5 GM/200ML-% IV SOLN
1000.0000 mg | Freq: Once | INTRAVENOUS | Status: DC
  Filled 2021-10-02: qty 200

## 2021-10-02 MED ORDER — APIXABAN 5 MG PO TABS
5.0000 mg | ORAL_TABLET | Freq: Two times a day (BID) | ORAL | Status: DC
  Administered 2021-10-02: 5 mg via ORAL
  Filled 2021-10-02: qty 1

## 2021-10-02 MED ORDER — MIDODRINE HCL 5 MG PO TABS
15.0000 mg | ORAL_TABLET | Freq: Three times a day (TID) | ORAL | Status: DC
  Administered 2021-10-03 (×3): 15 mg via ORAL
  Filled 2021-10-02 (×3): qty 3

## 2021-10-02 MED ORDER — ALBUMIN HUMAN 25 % IV SOLN
25.0000 g | Freq: Once | INTRAVENOUS | Status: AC
  Administered 2021-10-02: 25 g via INTRAVENOUS
  Filled 2021-10-02: qty 100

## 2021-10-02 MED ORDER — SODIUM CHLORIDE 0.9 % IV SOLN
1.0000 g | Freq: Every day | INTRAVENOUS | Status: DC
  Administered 2021-10-02: 1 g via INTRAVENOUS
  Filled 2021-10-02: qty 1

## 2021-10-02 MED ORDER — SODIUM CHLORIDE 0.9 % IV SOLN: 250.0000 mL | INTRAVENOUS | Status: DC

## 2021-10-02 MED ORDER — SODIUM CHLORIDE 0.9 % IV SOLN
500.0000 mg | Freq: Every day | INTRAVENOUS | Status: DC
  Administered 2021-10-02 – 2021-10-03 (×2): 500 mg via INTRAVENOUS
  Filled 2021-10-02 (×2): qty 0.5

## 2021-10-02 MED ORDER — VANCOMYCIN VARIABLE DOSE PER UNSTABLE RENAL FUNCTION (PHARMACIST DOSING): Status: DC

## 2021-10-02 MED ORDER — MIDODRINE HCL 5 MG PO TABS
15.0000 mg | ORAL_TABLET | Freq: Once | ORAL | Status: AC
  Administered 2021-10-02: 15 mg via ORAL
  Filled 2021-10-02: qty 3

## 2021-10-02 MED ORDER — "THROMBI-PAD 3""X3"" EX PADS"
1.0000 | MEDICATED_PAD | Freq: Once | CUTANEOUS | Status: AC
  Administered 2021-10-02: 1 via TOPICAL
  Filled 2021-10-02: qty 1

## 2021-10-02 MED ORDER — HEPARIN (PORCINE) 25000 UT/250ML-% IV SOLN
1350.0000 [IU]/h | INTRAVENOUS | Status: DC
  Administered 2021-10-02: 1100 [IU]/h via INTRAVENOUS
  Filled 2021-10-02: qty 250

## 2021-10-02 MED ORDER — VITAMIN D 25 MCG (1000 UNIT) PO TABS
1000.0000 [IU] | ORAL_TABLET | Freq: Every day | ORAL | Status: DC
  Administered 2021-10-02 – 2021-10-03 (×2): 1000 [IU] via ORAL
  Filled 2021-10-02 (×2): qty 1

## 2021-10-02 MED ORDER — CHLORHEXIDINE GLUCONATE CLOTH 2 % EX PADS
6.0000 | MEDICATED_PAD | Freq: Every day | CUTANEOUS | Status: DC
  Administered 2021-10-03: 6 via TOPICAL

## 2021-10-02 MED ORDER — NOREPINEPHRINE 4 MG/250ML-% IV SOLN
2.0000 ug/min | INTRAVENOUS | Status: DC
  Administered 2021-10-02 – 2021-10-04 (×2): 2 ug/min via INTRAVENOUS
  Filled 2021-10-02 (×2): qty 250

## 2021-10-02 NOTE — Progress Notes (Addendum)
[facility] NEPHROLOGY PROGRESS NOTE  Assessment/ Plan:  # Left thigh wound with septic shock: Initially required pressor currently off.  She is on broad-spectrum antibiotics including vancomycin and cefepime.  Seen by plastic surgery who recommended no surgery-continue due to wound care.  #  End-stage renal disease: On hemodialysis on MWF schedule with episodes of intradialytic hypotension that have been limiting to ultrafiltration for wound healing/management.  She had HD last 2 days consecutively, yesterday not much UF due to hypotension.  I will continue regular dialysis today by lowering dialysate temperature concomitant with the use of albumin and midodrine for intradialytic hypotension.  # Chronic hypotension: Compounded by septic shock, continue midodrine.  I will attempt to lower dialysate temperature to help blood pressure.  #Anemia of chronic kidney disease: Compounded by ESA resistance from wound healing, continue ESA and transfuse with PRBCs when hemoglobin <7.  # Chronic lymphedema/chronic diastolic heart failure  #Secondary hyperparathyroidism: Currently on calcitriol and lanthanum.  Calcium phosphorus level acceptable.  #Chronic hyponatremia, hypervolemic: UF as tolerated during HD.  I recommend fluid restriction.  Addendum:  Hypotensive not improving with midodrine and albumin. Recommend pressors in icu. Will hold HD today as she is not able to tolerate it. The patient likely needs CRRT tomorrow. Discussed with the RN and Dr [name] via secure chat.   Subjective: Seen and examined in ICU.  Patient was trying to eat breakfast.  She feels great without any complaint.  Denies headache, dizziness, nausea, vomiting.  She had dialysis yesterday with only 200 cc UF.  Not on pressors. Objective Vital signs in last 24 hours: Vitals:   10/02/21 0645 10/02/21 0700 10/02/21 0730 10/02/21 0747  BP:  (!) 161/132 (!) 96/42   Pulse: 78 75 85   Resp: _0 Temp:    97.7  F (36.5 C)  TempSrc:    Oral  SpO2: 96% 96% 99%   Weight:      Height:       Weight change: -2.2 kg  Intake/Output Summary (Last 24 hours) at 10/02/2021 0756 Last data filed at 10/02/2021 0600 Gross per 24 hour  Intake 809.18 ml  Output 204 ml  Net 605.18 ml       Labs: Basic Metabolic Panel: Recent Labs  Lab 09/29/21 0431 09/30/21 0423 10/01/21 0532  NA 132* 130*  131* 131*  K 4.1 4.6  4.6 4.6  CL 93* 93*  93* 92*  CO2 _1 GLUCOSE 192* 141*  140* 151*  BUN 50* 72*  71* 56*  CREATININE 3.63* 4.38*  4.38* 3.87*  CALCIUM 8.9 9.1  9.1 9.3  PHOS 3.8 4.3 4.5   Liver Function Tests: Recent Labs  Lab 09/29/21 0431 09/30/21 0423 10/01/21 0532  ALBUMIN 3.3* 3.2* 3.2*   No results for input(s): LIPASE, AMYLASE in the last 168 hours. No results for input(s): AMMONIA in the last 168 hours. CBC: Recent Labs  Lab 09/26/21 0517 09/27/21 0511 09/29/21 0431 09/30/21 0423 10/01/21 0532 10/02/21 0449  WBC 14.2* 13.6* 15.4* 17.4* 17.6* 24.6*  NEUTROABS 12.3*  --   --   --   --   --   HGB 8.2* 8.9* 9.5* 9.4* 9.3* 9.3*  HCT 25.2* 27.9* 29.5* 29.7* 29.4* 28.4*  MCV 93.3 94.3 97.4 96.7 96.7 95.3  PLT 197 199 254 243 254 213   Cardiac Enzymes: No results for input(s): CKTOTAL, CKMB, CKMBINDEX, TROPONINI in the last 168 hours. CBG: Recent Labs  Lab 10/01/21  1117 10/01/21 1947 10/01/21 2320 10/02/21 0344 10/02/21 0746  GLUCAP 170* 139* 220* 157* 167*    Iron Studies: No results for input(s): IRON, TIBC, TRANSFERRIN, FERRITIN in the last 72 hours. Studies/Results: No results found.  Medications: Infusions:  sodium chloride     sodium chloride     sodium chloride 10 mL/hr at 09/29/21 1113   ceFEPime (MAXIPIME) IV     heparin 1,100 Units/hr (10/02/21 0600)   phenylephrine ([name]-SYNEPHRINE) Adult infusion     vancomycin      Scheduled Medications:  amiodarone  200 mg Oral Daily   calcitRIOL  0.5 mcg Oral Q M,W,F-HD   calcium acetate   1,334 mg Oral TID with meals   Chlorhexidine Gluconate Cloth  6 each Topical Q0600   darbepoetin (ARANESP) injection - DIALYSIS  100 mcg Intravenous Q Fri-HD   docusate sodium  100 mg Oral BID   feeding supplement (NEPRO CARB STEADY)  237 mL Oral BID BM   Gerhardt's butt cream   Topical BID   hydrocortisone sod succinate (SOLU-CORTEF) inj  100 mg Intravenous Q8H   influenza vac split quadrivalent PF  0.5 mL Intramuscular Tomorrow-1000   insulin aspart  0-15 Units Subcutaneous Q4H   insulin detemir  5 Units Subcutaneous Daily   lanthanum  500 mg Oral TID WC   midodrine  25 mg Oral TID WC   multivitamin  1 tablet Oral QHS   nutrition supplement (JUVEN)  1 packet Oral BID BM   pantoprazole  40 mg Oral BID   sodium chloride flush  10-40 mL Intracatheter Q12H    have reviewed scheduled and prn medications.  Physical Exam: General:NAD, comfortable Heart:RRR, s1s2 nl Lungs:clear b/l, no crackle Abdomen:soft, Non-tender, non-distended Extremities: Dressing and boot applied to the leg. Dialysis Access: Left upper extremity AV fistula has good thrill and bruit.  [name] [name] 10/02/2021,7:56 AM  LOS: 15 days

## 2021-10-02 NOTE — Progress Notes (Signed)
[facility]  PROGRESS NOTE  [name] QMV:[id number] DOB: May 19, 1966 DOA: 09/17/2021 PCP: Noreene Larsson, NP   Brief HPI:   55 year old female with medical history of infected left thigh wound hospitalized from 7/3-9/5, required incision and drainage, plastic surgery consultation and wound VAC.  Hospital course was complicated by septic shock and heart failure, essential hypertension, ESRD on HD MWF, chronic diastolic heart failure, COPD, diabetes mellitus type 2, chronic lymphedema, morbid obesity who presented to [facility] ED on 9/29 with worsening left eye infection, generalized weakness and fatigue in the setting of missed dialysis session.  Patient initially presented to the ED on 9/9 at advice of wound care nurse for concern about infection.  She was discharged with recommendation to continue current wound care.  She was supposed to follow with plastic surgeon 9/27 however came to ER once again at the recommendation of wound care nurse for evaluation of left leg wound with concern for worsening infection.  Plastic surgery was consulted in the ED recommended removal of wound VAC and was removed she had superficial venous bleeding due to the pressure of superficial vein which was controlled with direct pressure sutures and compression wrap.  Vascular surgery was consulted and recommended packing the wound and applying compression wrap.  She was also complaining of worsening pain and purulent foul-smelling discharge from the wound site.  Patient developed septic shock from left leg wound with associated acute metabolic encephalopathy on 09/21/2021 and was started on norepinephrine along with multiple antibiotics.  Patient was transferred to [facility] for possible debridement of left thigh wound.  9/29 admitted w/ concern for infected left thigh concern. Not clearly infected BUT did have bleeding on removal of wound VAC req 'd pressure and suture. Plastic surg recommended dc wound vac and daily  change 10/4 developed shock and encephalopathy. CT thigh. Soft tissue wound along the anteromedial distal thigh with surrounding skin thickening and underlying subcutaneous edema. No fluid collection or subcutaneous emphysema.Started on vanc and cefepime. Right femoral CVL placed.  10/5 hydrocortisone started 10/6, still pressor dependent so midodrine increased to 36m tid 10/11 off pressors 10/12 back on pressors. [facility] for surgical eval and possible need for CRRT  10/14, [facility] resume care  Subjective   Patient seen and examined, she is off pressors, blood pressure still soft.  Blood culture growing Pseudomonas despite being on cefepime.   Assessment/Plan:    Septic shock -Secondary to infected left thigh wound -Patient was started on pressors which have been weaned off -Blood pressure is still soft, patient is completely asymptomatic -She also has ESRD, on midodrine per nephrology -She was given albumin 25% 25 g IV x1 along with midodrine 15 mg p.o. x1 -If BP remains low, will have to start her on pressors -Continue Solu-Cortef 100 mg IV every 8 hours for stress dose -She has been on vancomycin and cefepime -Blood culture growing Pseudomonas despite being on cefepime, will switch to meropenem and follow blood culture results  Chronic left medial thigh wound -Continue daily dressing as per wound care -Plastic surgery also saw patient yesterday, recommended [place] surgical gel to the area twice daily to hydrate the wound, no surgery planned  Chronic diastolic heart failure -Continue telemetry -Volume removal as tolerated by hemodialysis  History of paroxysmal atrial fibrillation -Heart rate controlled, continue amiodarone -Continue apixaban for anticoagulation  ESRD -Patient on hemodialysis Monday Wednesday Friday -Nephrology following  Anemia of chronic disease -Secondary to ESRD -Follow CBC and transfuse for hemoglobin less than  7  Diabetes mellitus type  2 -CBG well controlled -Continue sliding scale insulin with NovoLog    Scheduled medications:    amiodarone  200 mg Oral Daily   apixaban  5 mg Oral BID   calcitRIOL  0.5 mcg Oral Q M,W,F-HD   calcium acetate  1,334 mg Oral TID with meals   Chlorhexidine Gluconate Cloth  6 each Topical Q0600   Chlorhexidine Gluconate Cloth  6 each Topical Q0600   cholecalciferol  1,000 Units Oral Daily   darbepoetin (ARANESP) injection - DIALYSIS  100 mcg Intravenous Q Fri-HD   docusate sodium  100 mg Oral BID   feeding supplement (NEPRO CARB STEADY)  237 mL Oral BID BM   Gerhardt's butt cream   Topical BID   hydrocortisone sod succinate (SOLU-CORTEF) inj  100 mg Intravenous Q8H   influenza vac split quadrivalent PF  0.5 mL Intramuscular Tomorrow-1000   insulin aspart  0-15 Units Subcutaneous Q4H   insulin detemir  5 Units Subcutaneous Daily   lanthanum  500 mg Oral TID WC   midodrine  25 mg Oral TID WC   multivitamin  1 tablet Oral QHS   nutrition supplement (JUVEN)  1 packet Oral BID BM   pantoprazole  40 mg Oral BID   sodium chloride flush  10-40 mL Intracatheter Q12H     Data Reviewed:   CBG:  Recent Labs  Lab 10/01/21 2320 10/02/21 0344 10/02/21 0746 10/02/21 1124 10/02/21 1517  GLUCAP 220* 157* 167* 183* 194*    SpO2: 92 % O2 Flow Rate (L/min): 1 L/min    Vitals:   10/02/21 1400 10/02/21 1430 10/02/21 1500 10/02/21 1518  BP: (!) 76/59 (!) 77/64 (!) 74/59   Pulse: (!) 122 (!) 124 (!) 116   Resp: _0 Temp:    97.6 F (36.4 C)  TempSrc:    Oral  SpO2: 93% 90% 92%   Weight:      Height:         Intake/Output Summary (Last 24 hours) at 10/02/2021 1528 Last data filed at 10/02/2021 1430 Gross per 24 hour  Intake 1875.69 ml  Output 204 ml  Net 1671.69 ml    10/12 1901 - 10/14 0700 In: 1194.1 [P.O.:817; I.V.:377.1] Out: 204   Filed Weights   10/01/21 1519 10/01/21 1838 10/02/21 0500  Weight: 127.8 kg 126.6 kg 126.7 kg    Data Reviewed: Basic  Metabolic Panel: Recent Labs  Lab 09/26/21 0517 09/27/21 0511 09/28/21 0430 09/29/21 0431 09/30/21 0423 10/01/21 0532 10/02/21 0748  NA 133* 131* 130* 132* 130*  131* 131* 132*  K 3.3* 3.5 3.9 4.1 4.6  4.6 4.6 4.0  CL 93* 92* 91* 93* 93*  93* 92* 94*  CO2 _1 GLUCOSE 229* 171* 159* 192* 141*  140* 151* 157*  BUN 35* 52* 70* 50* 72*  71* 56* 46*  CREATININE 3.12* 3.96* 4.68* 3.63* 4.38*  4.38* 3.87* 3.18*  CALCIUM 8.8* 9.1 9.0 8.9 9.1  9.1 9.3 9.2  MG 1.9 2.1 2.1 2.0 2.1  --   --   PHOS 3.8 4.3 4.6 3.8 4.3 4.5 3.9   Liver Function Tests: Recent Labs  Lab 09/28/21 0430 09/29/21 0431 09/30/21 0423 10/01/21 0532 10/02/21 0748  ALBUMIN 3.4* 3.3* 3.2* 3.2* 3.1*   No results for input(s): LIPASE, AMYLASE in the last 168 hours. No results for input(s): AMMONIA in the last 168 hours. CBC: Recent Labs  Lab  09/26/21 0517 09/27/21 0511 09/29/21 0431 09/30/21 0423 10/01/21 0532 10/02/21 0449 10/02/21 1033  WBC 14.2* 13.6* 15.4* 17.4* 17.6* 24.6*  --   NEUTROABS 12.3*  --   --   --   --   --   --   HGB 8.2* 8.9* 9.5* 9.4* 9.3* 9.3* 9.4*  HCT 25.2* 27.9* 29.5* 29.7* 29.4* 28.4* 29.9*  MCV 93.3 94.3 97.4 96.7 96.7 95.3  --   PLT 197 199 254 243 254 213  --    Cardiac Enzymes: No results for input(s): CKTOTAL, CKMB, CKMBINDEX, TROPONINI in the last 168 hours. BNP (last 3 results) Recent Labs    06/29/21 1534 07/01/21 0550 09/17/21 1325  BNP 718.9* 2,062.9* 762.0*    ProBNP (last 3 results) No results for input(s): PROBNP in the last 8760 hours.  CBG: Recent Labs  Lab 10/01/21 2320 10/02/21 0344 10/02/21 0746 10/02/21 1124 10/02/21 1517  GLUCAP 220* 157* 167* 183* 194*       Radiology Reports  No results found.     Antibiotics: Anti-infectives (From admission, onward)    Start     Dose/Rate Route Frequency Ordered Stop   10/02/21 1615  meropenem (MERREM) 500 mg in sodium chloride 0.9 % 100 mL IVPB        500 mg 200  mL/hr over 30 Minutes Intravenous Daily at bedtime 10/02/21 1521     10/02/21 1200  ceFEPIme (MAXIPIME) 2 g in sodium chloride 0.9 % 100 mL IVPB  Status:  Discontinued        2 g 200 mL/hr over 30 Minutes Intravenous Every M-W-F (Hemodialysis) 10/01/21 1011 10/01/21 1013   10/02/21 1200  vancomycin (VANCOCIN) IVPB 1000 mg/200 mL premix  Status:  Discontinued        1,000 mg 200 mL/hr over 60 Minutes Intravenous Every M-W-F (Hemodialysis) 10/01/21 1011 10/01/21 1013   10/02/21 1200  ceFEPIme (MAXIPIME) 2 g in sodium chloride 0.9 % 100 mL IVPB  Status:  Discontinued        2 g 200 mL/hr over 30 Minutes Intravenous Every M-W-F (Hemodialysis) 10/01/21 1013 10/02/21 0931   10/02/21 1200  vancomycin (VANCOCIN) IVPB 1000 mg/200 mL premix  Status:  Discontinued        1,000 mg 200 mL/hr over 60 Minutes Intravenous Every M-W-F (Hemodialysis) 10/01/21 1013 10/02/21 0931   10/02/21 1030  ceFEPIme (MAXIPIME) 1 g in sodium chloride 0.9 % 100 mL IVPB  Status:  Discontinued        1 g 200 mL/hr over 30 Minutes Intravenous Daily at bedtime 10/02/21 0931 10/02/21 1521   10/02/21 1030  vancomycin (VANCOCIN) IVPB 1000 mg/200 mL premix  Status:  Discontinued        1,000 mg 200 mL/hr over 60 Minutes Intravenous  Once 10/02/21 0931 10/02/21 1012   10/02/21 0928  vancomycin variable dose per unstable renal function (pharmacist dosing)  Status:  Discontinued         Does not apply See admin instructions 10/02/21 0931 10/02/21 1012   09/30/21 1724  vancomycin and cefepime variable per unstable renal function (pharmacist dosing)  Status:  Discontinued         Does not apply See admin instructions 09/30/21 1724 10/01/21 1011   09/30/21 1200  ceFEPIme (MAXIPIME) 2 g in sodium chloride 0.9 % 100 mL IVPB        2 g 200 mL/hr over 30 Minutes Intravenous Every M-W-F (Hemodialysis) 09/29/21 0842 09/30/21 1413   09/30/21 1200  vancomycin (VANCOCIN) IVPB 1000  mg/200 mL premix        1,000 mg 200 mL/hr over 60 Minutes  Intravenous Every M-W-F (Hemodialysis) 09/29/21 0842 09/30/21 1340   09/23/21 1200  vancomycin (VANCOCIN) IVPB 1000 mg/200 mL premix  Status:  Discontinued        1,000 mg 200 mL/hr over 60 Minutes Intravenous Every M-W-F (Hemodialysis) 09/21/21 1400 09/29/21 0842   09/23/21 1200  ceFEPIme (MAXIPIME) 2 g in sodium chloride 0.9 % 100 mL IVPB  Status:  Discontinued        2 g 200 mL/hr over 30 Minutes Intravenous Every M-W-F (Hemodialysis) 09/21/21 1400 09/29/21 0842   09/21/21 1130  ceFEPIme (MAXIPIME) 2 g in sodium chloride 0.9 % 100 mL IVPB        2 g 200 mL/hr over 30 Minutes Intravenous  Once 09/21/21 1035 09/21/21 1540   09/21/21 1130  metroNIDAZOLE (FLAGYL) IVPB 500 mg        500 mg 100 mL/hr over 60 Minutes Intravenous Every 12 hours 09/21/21 1035 09/27/21 2233   09/21/21 1130  vancomycin (VANCOREADY) IVPB 2000 mg/400 mL        2,000 mg 200 mL/hr over 120 Minutes Intravenous  Once 09/21/21 1044 09/21/21 1534   09/21/21 1045  vancomycin (VANCOCIN) IVPB 1000 mg/200 mL premix  Status:  Discontinued        1,000 mg 200 mL/hr over 60 Minutes Intravenous Every 1 hr x 2 09/21/21 1035 09/21/21 1044         DVT prophylaxis: Apixaban  Code Status: Full code  Family Communication: No family at bedside   Consultants:   Procedures:     Objective    Physical Examination:   General-appears in no acute distress Heart-S1-S2, regular, no murmur auscultated Lungs-clear to auscultation bilaterally, no wheezing or crackles auscultated Abdomen-soft, nontender, no organomegaly Extremities-no edema in the lower extremities Neuro-alert, oriented x3, no focal deficit noted  Status is: Inpatient  Dispo: The patient is from: Home              Anticipated d/c is to: Home              Anticipated d/c date is: 10/14/2021              Patient currently not stable for discharge  Barrier to discharge-ongoing treatment for septic shock from left leg wound  COVID-19 Labs  No results  for input(s): DDIMER, FERRITIN, LDH, CRP in the last 72 hours.  Lab Results  Component Value Date   SARSCOV2NAA NEGATIVE 09/17/2021   [place] NEGATIVE 06/21/2021   [place] NEGATIVE 06/19/2021   [place] NEGATIVE 06/30/2020     Pressure Injury 07/04/21 Thigh Posterior;Proximal;Right Stage 2 -  Partial thickness loss of dermis presenting as a shallow open injury with a red, pink wound bed without slough. (Active)  07/04/21 1200  Location: Thigh (left thigh)  Location Orientation: Posterior;Proximal;Right  Staging: Stage 2 -  Partial thickness loss of dermis presenting as a shallow open injury with a red, pink wound bed without slough.  Wound Description (Comments):   Present on Admission: No     Pressure Injury 09/30/21 Sacrum Mid Stage 2 -  Partial thickness loss of dermis presenting as a shallow open injury with a red, pink wound bed without slough. multiple small areas of open skin on sacrum (Active)  09/30/21 1600  Location: Sacrum  Location Orientation: Mid  Staging: Stage 2 -  Partial thickness loss of dermis presenting as a shallow open injury with a red,  pink wound bed without slough.  Wound Description (Comments): multiple small areas of open skin on sacrum  Present on Admission: Yes        Recent Results (from the past 240 hour(s))  Culture, blood (Routine X 2) w Reflex to ID Panel     Status: None (Preliminary result)   Collection Time: 10/01/21 12:07 PM   Specimen: BLOOD  Result Value Ref Range Status   Specimen Description BLOOD RIGHT ANTECUBITAL  Final   Special Requests   Final    BOTTLES DRAWN AEROBIC ONLY Blood Culture results may not be optimal due to an inadequate volume of blood received in culture bottles   Culture   Final    NO GROWTH < 24 HOURS Performed at [facility], [place] [address], [place], [place] [address]    Report Status PENDING  Incomplete  Culture, blood (Routine X 2) w Reflex to ID Panel     Status: None (Preliminary  result)   Collection Time: 10/01/21 12:08 PM   Specimen: BLOOD RIGHT HAND  Result Value Ref Range Status   Specimen Description BLOOD RIGHT HAND  Final   Special Requests   Final    BOTTLES DRAWN AEROBIC AND ANAEROBIC Blood Culture adequate volume   Culture  Setup Time   Final    GRAM NEGATIVE RODS IN BOTH AEROBIC AND ANAEROBIC BOTTLES CRITICAL RESULT CALLED TO, READ BACK BY AND VERIFIED WITH: A,LAWLESS PHARMD _0  10/02/21 EB Performed at [facility], [place] [address], [place], [place] [address]    Culture GRAM NEGATIVE RODS  Final   Report Status PENDING  Incomplete  Blood Culture ID Panel (Reflexed)     Status: Abnormal   Collection Time: 10/01/21 12:08 PM  Result Value Ref Range Status   Enterococcus faecalis NOT DETECTED NOT DETECTED Final   Enterococcus Faecium NOT DETECTED NOT DETECTED Final   Listeria monocytogenes NOT DETECTED NOT DETECTED Final   Staphylococcus species NOT DETECTED NOT DETECTED Final   Staphylococcus aureus (BCID) NOT DETECTED NOT DETECTED Final   Staphylococcus epidermidis NOT DETECTED NOT DETECTED Final   Staphylococcus lugdunensis NOT DETECTED NOT DETECTED Final   Streptococcus species NOT DETECTED NOT DETECTED Final   Streptococcus agalactiae NOT DETECTED NOT DETECTED Final   Streptococcus pneumoniae NOT DETECTED NOT DETECTED Final   Streptococcus pyogenes NOT DETECTED NOT DETECTED Final   A.calcoaceticus-baumannii NOT DETECTED NOT DETECTED Final   Bacteroides fragilis NOT DETECTED NOT DETECTED Final   Enterobacterales NOT DETECTED NOT DETECTED Final   Enterobacter cloacae complex NOT DETECTED NOT DETECTED Final   Escherichia coli NOT DETECTED NOT DETECTED Final   Klebsiella aerogenes NOT DETECTED NOT DETECTED Final   Klebsiella oxytoca NOT DETECTED NOT DETECTED Final   Klebsiella pneumoniae NOT DETECTED NOT DETECTED Final   Proteus species NOT DETECTED NOT DETECTED Final   Salmonella species NOT DETECTED NOT DETECTED Final   Serratia  marcescens NOT DETECTED NOT DETECTED Final   Haemophilus influenzae NOT DETECTED NOT DETECTED Final   Neisseria meningitidis NOT DETECTED NOT DETECTED Final   Pseudomonas aeruginosa DETECTED (A) NOT DETECTED Final    Comment: CRITICAL RESULT CALLED TO, READ BACK BY AND VERIFIED WITH: PHARMD ALEX L 1020 [id number] FCP    Stenotrophomonas maltophilia NOT DETECTED NOT DETECTED Final   Candida albicans NOT DETECTED NOT DETECTED Final   Candida auris NOT DETECTED NOT DETECTED Final   Candida glabrata NOT DETECTED NOT DETECTED Final   Candida krusei NOT DETECTED NOT DETECTED Final   Candida  parapsilosis NOT DETECTED NOT DETECTED Final   Candida tropicalis NOT DETECTED NOT DETECTED Final   Cryptococcus neoformans/gattii NOT DETECTED NOT DETECTED Final   CTX-M ESBL NOT DETECTED NOT DETECTED Final   Carbapenem resistance IMP NOT DETECTED NOT DETECTED Final   Carbapenem resistance KPC NOT DETECTED NOT DETECTED Final   Carbapenem resistance NDM NOT DETECTED NOT DETECTED Final   Carbapenem resistance VIM NOT DETECTED NOT DETECTED Final    Comment: Performed at [facility], [place] [address], [place], [place] [address]    [facility] If 7PM-7AM, please contact night-coverage at [email or web address], Office  [phone]   10/02/2021, 3:28 PM  LOS: 15 days

## 2021-10-02 NOTE — Progress Notes (Signed)
NAME:  [name], MRN:  [id number], DOB:  [birth year], LOS: 28 ADMISSION DATE:  09/17/2021, CONSULTATION DATE:  10/12 REFERRING MD:  [email or web address], CHIEF COMPLAINT:  wound eval and sepsis    History of Present Illness:  55 year old female with multiple medical problems.  Admitted from home initially to [facility] on 9/29 with chief complaint of worsening left thigh wound.  Discharged 9/5 after prolonged hospital stay for infected left thigh wound with wound VAC, which she refused. Actually presented to the ER 9/9 at advice of wound care nurse for concern about infection.  Was discharged with recommendations to continue current wound care.  She was supposed to see plastic surgery on 9/27 however presented to the ER once again at the recommendation of her wound care nurse for evaluation of left thigh wound with concern about worsening infection.  In ER she was noted to be negative for fever chills shortness of breath.  She had missed 2 doses of dialysis prior to presentation.  Plastic surgery was consulted in the ER and recommended removing wound VAC, wound this was removed she had superficial venous bleeding due to rupture of superficial vein which was controlled with direct pressure, sutures, and compression wrap.  Vascular surgery was consulted and recommended packing the wound and applying compression wrap.  She was initially admitted for evaluation and monitoring of this wound. Hospital course 9/29 through 10/12: Admitted.  Nephrology consulted and assisted with dialysis.  Volume status managed through dialysis with volume removal.  Wound managed with Xeroform dressings changed daily initially.  Initial hospital course complicated significantly by difficult to control electrolyte disturbances. 10/3: White blood cell increased from 9.9-29.1, new acute metabolic encephalopathy, new SIRS/sepsis which was not present on admission.  She was hypotensive.  Transferred to the intensive care and  started on norepinephrine.  Blood cultures were sent. Vanc and cefepime started. WOC re-consulted: no change in rx advised. 10/4 right femoral CVL placed by surgery. 10/5 added hydrocortisone for stress dose. Still pressor dependent but mental status better. Midodrine increased 10/6, still pressor dependent so midodrine increased to 39m tid, 10/11 off pressors. 10/12 back on pressors. Concern for on-going wound infection. Surgery consulted to [facility]. [facility] evaluation and possible need for CRRT>  Pertinent  Medical History   Infected left thigh wound hospitalized from 7/3 through 9/5 (64 days) required incision and drainage, plastic surgery consultation and wound VAC.  Hospital course complicated by septic shock and heart failure .  HTN, ESRD (MWF), HFpEF/ chronic, COPD, type II dm, chronic Lymphedema of lower extremities,  MO (class III BMI 49.9)  HLD, atrial fib, anxiety, Chronic pain, Prior respiratory arrest after anesthesia at age 55 CAD last cath 2010. OSA  Significant Hospital Events: Including procedures, antibiotic start and stop dates in addition to other pertinent events   9/29 admitted w/ concern for infected left thigh concern. Not clearly infected BUT did have bleeding on removal of wound VAC req 'd pressure and suture. Plastic surg recommended dc wound vac and daily change 10/4 developed shock and encephalopathy. CT thigh. Soft tissue wound along the anteromedial distal thigh with surrounding skin thickening and underlying subcutaneous edema. No fluid collection or subcutaneous emphysema.Started on vanc and cefepime. Right femoral CVL placed.  10/5 hydrocortisone started 10/6, still pressor dependent so midodrine increased to 22mtid 10/11 off pressors 10/12 back on pressors. [facility] for surgical eval and possible need for CRRT  [facility] hypotension refractory to repeated midodrine dosing, albumin  prompted PCCM reinvolvement  Interim History / Subjective:  Too hypotensive for HD today despite administration of repeated dose of midodrine, 25g albumin.   BCID panel from 10/13 growing pseudomonas   Objective   Blood pressure (!) 74/63, pulse (!) 123, temperature 97.6 F (36.4 C), temperature source Oral, resp. rate 16, height _0  (1.651 m), weight 126.7 kg, SpO2 94 %.        Intake/Output Summary (Last 24 hours) at 10/02/2021 1703 Last data filed at 10/02/2021 1430 Gross per 24 hour  Intake 1853.69 ml  Output 204 ml  Net 1649.69 ml   Filed Weights   10/01/21 1519 10/01/21 1838 10/02/21 0500  Weight: 127.8 kg 126.6 kg 126.7 kg    Examination: General appearance: 55 y.o., female, NAD, chronically ill appearing Eyes: anicteric sclerae, moist conjunctivae; no lid-lag; PERRL, tracking appropriately HENT: NCAT;  MMM Lungs: Diminished b/l no crackles, no wheeze, with normal respiratory effort CV: RRR, no MRGs  Abdomen: Soft, non-tender; non-distended, BS present  Extremities: 1+ BLE edema, necrotic wound that appears similar to pictures from 9/29 over left thigh Psych: Appropriate affect Neuro: Alert and oriented to person and place, no focal deficit       Resolved Hospital Problem list     Assessment & Plan:  Shock  Sepsis with PsA bacteremia and ?ecthyma. Her [place] may additionally be contributing to her intolerance of HD sessions. - remove central line - merrem  - continue stress dose steroids - wean levo for map 65 - reinvolve plastics in AM given BCID panel showing pseudomonas perhaps greater concern wound infected  Chronic left medial thigh wound. Plan - dressings per WOCN, plastics - hold DOAC in case needs debridement  chronic diastolic heart failure -Has not had a portable chest x-ray since 10/3, is on minimal supplemental oxygen Plan Continue telemetry -volume removal as tolerated with iHD vs CRRT  History of paroxysmal atrial fibrillation Plan   continue hep gtt and amiodarone (hold apixaban given need for dbridement)   End-stage renal disease, typically on dialysis Monday/Wednesday/Friday.  Having difficulty tolerating intermittent dialysis with ongoing hypotension, and volume overload Plan -appreciate nephrology -may need vascath/CRRT tomorrow, pt agreeable to this if needed  Fluid electrolyte imbalance in the setting of end-stage renal disease Plan Continue to monitor Correct as indicated  Anemia of end-stage renal disease without evidence of bleeding Plan Trend CBC Transfusion trigger less than 7  Type 2 diabetes Plan Sliding scale insulin     Best Practice (right click and "Reselect all SmartList Selections" daily)   Diet/type: Regular consistency (see orders) DVT prophylaxis: systemic heparin GI prophylaxis: PPI Lines: Central line and No longer needed.  Order written to d/c  Foley:  N/A Code Status:  full code Last date of multidisciplinary goals of care discussion [with pt 10/13 ]  Labs   CBC: Recent Labs  Lab 09/26/21 0517 09/27/21 0511 09/29/21 0431 09/30/21 0423 10/01/21 0532 10/02/21 0449 10/02/21 1033  WBC 14.2* 13.6* 15.4* 17.4* 17.6* 24.6*  --   NEUTROABS 12.3*  --   --   --   --   --   --   HGB 8.2* 8.9* 9.5* 9.4* 9.3* 9.3* 9.4*  HCT 25.2* 27.9* 29.5* 29.7* 29.4* 28.4* 29.9*  MCV 93.3 94.3 97.4 96.7 96.7 95.3  --   PLT 197 199 254 243 254 213  --     Basic Metabolic Panel: Recent Labs  Lab 09/26/21 0517 09/27/21 0511  09/28/21 0430 09/29/21 0431 09/30/21 0423 10/01/21 0532 10/02/21 0748  NA 133* 131* 130* 132* 130*  131* 131* 132*  K 3.3* 3.5 3.9 4.1 4.6  4.6 4.6 4.0  CL 93* 92* 91* 93* 93*  93* 92* 94*  CO2 _0 GLUCOSE 229* 171* 159* 192* 141*  140* 151* 157*  BUN 35* 52* 70* 50* 72*  71* 56* 46*  CREATININE 3.12* 3.96* 4.68* 3.63* 4.38*  4.38* 3.87* 3.18*  CALCIUM 8.8* 9.1 9.0 8.9 9.1  9.1 9.3 9.2  MG 1.9 2.1 2.1 2.0 2.1  --   --   PHOS  3.8 4.3 4.6 3.8 4.3 4.5 3.9   GFR: Estimated Creatinine Clearance: 26.8 mL/min (A) (by C-G formula based on SCr of 3.18 mg/dL (H)). Recent Labs  Lab 09/29/21 0431 09/30/21 0423 10/01/21 0532 10/02/21 0449  WBC 15.4* 17.4* 17.6* 24.6*    Liver Function Tests: Recent Labs  Lab 09/28/21 0430 09/29/21 0431 09/30/21 0423 10/01/21 0532 10/02/21 0748  ALBUMIN 3.4* 3.3* 3.2* 3.2* 3.1*   No results for input(s): LIPASE, AMYLASE in the last 168 hours. No results for input(s): AMMONIA in the last 168 hours.  ABG    Component Value Date/Time   PHART 7.299 (L) 06/22/2021 1414   PCO2ART 47.5 06/22/2021 1414   PO2ART 89 06/22/2021 1414   HCO3 28.8 (H) 07/02/2021 1321   TCO2 31 07/02/2021 1321   ACIDBASEDEF 3.0 (H) 06/22/2021 1414   O2SAT 53.0 07/02/2021 1321     Coagulation Profile: Recent Labs  Lab 09/29/21 1126  INR 2.4*    Cardiac Enzymes: No results for input(s): CKTOTAL, CKMB, CKMBINDEX, TROPONINI in the last 168 hours.  HbA1C: Hgb A1c MFr Bld  Date/Time Value Ref Range Status  09/18/2021 06:20 AM 6.8 (H) 4.8 - 5.6 % Final    Comment:    (NOTE) Pre diabetes:          5.7%-6.4%  Diabetes:              >6.4%  Glycemic control for   <7.0% adults with diabetes   06/22/2021 04:54 AM 6.9 (H) 4.8 - 5.6 % Final    Comment:    (NOTE) Pre diabetes:          5.7%-6.4%  Diabetes:              >6.4%  Glycemic control for   <7.0% adults with diabetes     CBG: Recent Labs  Lab 10/01/21 2320 10/02/21 0344 10/02/21 0746 10/02/21 1124 10/02/21 1517  GLUCAP 220* 157* 167* 183* 194*   Critical care time: The patient is critically ill with multiple organ systems failure and requires high complexity decision making for assessment and support, frequent evaluation and titration of therapies, application of advanced monitoring technologies and extensive interpretation of multiple databases.  Critical care time 39 mins. This represents my time independent of the NPs  time taking care of the pt. This is excluding procedures.    Fredirick Maudlin Pulmonary/Critical Care  10/02/2021, 5:03 PM See Amion for pager If no response to pager, please call [phone] until 1900 After 1900 please call [facility]

## 2021-10-02 NOTE — Progress Notes (Signed)
Pt receives out-pt HD at [facility] on MWF. Pt arrives at 6:00 for 6:15 chair time. Will assist as needed.  Melven Sartorius Renal Navigator [phone]

## 2021-10-02 NOTE — Progress Notes (Signed)
ANTICOAGULATION CONSULT NOTE - Follow Up Consult  Pharmacy Consult for Heparin Indication: atrial fibrillation  Allergies  Allergen Reactions   Contrast Media [Iodinated Diagnostic Agents] Anaphylaxis, Hives, Swelling and Other (See Comments)    Dye for cardiac cath. Tongue swells   Pneumococcal Vaccines Swelling and Other (See Comments)    Turns skin black, and bodily swelling   Vancomycin Nausea And Vomiting and Other (See Comments)    Infusion "made me feel like I was dying" had to be readmitted to hospital    Patient Measurements: Height: _0  (165.1 cm) Weight: 126.7 kg (279 lb 5.2 oz) IBW/kg (Calculated) : 57 Heparin Dosing Weight:    Vital Signs: Temp: 97.6 F (36.4 C) (10/14 1518) Temp Source: Oral (10/14 1518) BP: 97/62 (10/14 1740) Pulse Rate: 97 (10/14 1740)  Labs: Recent Labs    09/30/21 0423 10/01/21 0532 10/01/21 1553 10/02/21 0100 10/02/21 0449 10/02/21 0748 10/02/21 1033  HGB 9.4* 9.3*  --   --  9.3*  --  9.4*  HCT 29.7* 29.4*  --   --  28.4*  --  29.9*  PLT 243 254  --   --  213  --   --   APTT  --  127* 70* 81*  --   --   --   HEPARINUNFRC  --  >1.10*  --  >1.10*  --   --   --   CREATININE 4.38*  4.38* 3.87*  --   --   --  3.18*  --     Estimated Creatinine Clearance: 26.8 mL/min (A) (by C-G formula based on SCr of 3.18 mg/dL (H)).  Assessment: Anticoag: apixaban 5 mg BID for afib (LD 10/12 AM).  - IV heparin 10/12-10/14,   - 10/14 Eliquis x 1 dose - 10/14 PM resume heparin for afib. (Had one dose of Eliquis this AM). (hold apixaban given need for dbridement)   Goal of Therapy:  Heparin level 0.3-0.7 units/ml aPTT 66-102 Monitor platelets by anticoagulation protocol: Yes   Plan:  Resume IV heparin at previous rate of 1100 units/hr (aPTT in goal range this AM).  Check daily aPTT, HL, CBC   Zenna Traister S. Alford Highland, PharmD, BCPS Clinical Staff Pharmacist [email or web address]  Alford Highland, [employer] 10/02/2021,5:53 PM

## 2021-10-02 NOTE — Progress Notes (Signed)
PHARMACY - PHYSICIAN COMMUNICATION CRITICAL VALUE ALERT - BLOOD CULTURE IDENTIFICATION (BCID)  [name] is an 55 y.o. female who presented to [facility] on 09/17/2021 with a chief complaint of worsening left thigh wound.   Assessment:   1/3 bottles with GNR in aerobic bottle BCID Detected Pseudomonas aeruginosa  Name of physician (or Provider) Contacted: Dr. Darrick Meigs and Pharmacy Team  Current antibiotics: Cefepime 1g IV q24h  Changes to prescribed antibiotics recommended:  Consider switching to meropenem given that patient has been on Cefepime since 10/3 and this is a new bacteremia.  Results for orders placed or performed during the hospital encounter of 09/17/21  Blood Culture ID Panel (Reflexed) (Collected: 10/01/2021 12:08 PM)  Result Value Ref Range   Enterococcus faecalis NOT DETECTED NOT DETECTED   Enterococcus Faecium NOT DETECTED NOT DETECTED   Listeria monocytogenes NOT DETECTED NOT DETECTED   Staphylococcus species NOT DETECTED NOT DETECTED   Staphylococcus aureus (BCID) NOT DETECTED NOT DETECTED   Staphylococcus epidermidis NOT DETECTED NOT DETECTED   Staphylococcus lugdunensis NOT DETECTED NOT DETECTED   Streptococcus species NOT DETECTED NOT DETECTED   Streptococcus agalactiae NOT DETECTED NOT DETECTED   Streptococcus pneumoniae NOT DETECTED NOT DETECTED   Streptococcus pyogenes NOT DETECTED NOT DETECTED   A.calcoaceticus-baumannii NOT DETECTED NOT DETECTED   Bacteroides fragilis NOT DETECTED NOT DETECTED   Enterobacterales NOT DETECTED NOT DETECTED   Enterobacter cloacae complex NOT DETECTED NOT DETECTED   Escherichia coli NOT DETECTED NOT DETECTED   Klebsiella aerogenes NOT DETECTED NOT DETECTED   Klebsiella oxytoca NOT DETECTED NOT DETECTED   Klebsiella pneumoniae NOT DETECTED NOT DETECTED   Proteus species NOT DETECTED NOT DETECTED   Salmonella species NOT DETECTED NOT DETECTED   Serratia marcescens NOT DETECTED NOT DETECTED   Haemophilus influenzae NOT  DETECTED NOT DETECTED   Neisseria meningitidis NOT DETECTED NOT DETECTED   Pseudomonas aeruginosa DETECTED (A) NOT DETECTED   Stenotrophomonas maltophilia NOT DETECTED NOT DETECTED   Candida albicans NOT DETECTED NOT DETECTED   Candida auris NOT DETECTED NOT DETECTED   Candida glabrata NOT DETECTED NOT DETECTED   Candida krusei NOT DETECTED NOT DETECTED   Candida parapsilosis NOT DETECTED NOT DETECTED   Candida tropicalis NOT DETECTED NOT DETECTED   Cryptococcus neoformans/gattii NOT DETECTED NOT DETECTED   CTX-M ESBL NOT DETECTED NOT DETECTED   Carbapenem resistance IMP NOT DETECTED NOT DETECTED   Carbapenem resistance KPC NOT DETECTED NOT DETECTED   Carbapenem resistance NDM NOT DETECTED NOT DETECTED   Carbapenem resistance VIM NOT DETECTED NOT DETECTED    Lestine Box, PharmD PGY2 Infectious Diseases Pharmacy Resident   Please check [email or web address] for unit-specific pharmacy phone numbers

## 2021-10-02 NOTE — Plan of Care (Signed)

## 2021-10-02 NOTE — Progress Notes (Addendum)
Pharmacy Antibiotic Note  [name] is a 55 y.o. female admitted on 09/17/2021 with worsening L thigh wound. PMH includes ESRD on HD MWF. Patient transferred from [facility] to [facility] for surgical consult and possible need for CRRT (which is not indicated at this time). Pharmacy has been consulted to continue vancomycin and cefepime dosing for wound infection.   Cefepime 2g with HD MWF Vancomycin 1g with HD MWF  Additional dialysis session this week on Thursday, normal MWF schedule with plans to continue dialysis today.   Plan: Transition to cefepime 1g q24h with variable HD schedule Vancomcin 1g now and variable dosing MAR hold F/u LOT and need for random vancomycin level  Height: _0  (165.1 cm) Weight: 126.7 kg (279 lb 5.2 oz) IBW/kg (Calculated) : 57  Temp (24hrs), Avg:97.6 F (36.4 C), Min:96.2 F (35.7 C), Max:97.9 F (36.6 C)  Recent Labs  Lab 09/27/21 0511 09/28/21 0430 09/29/21 0431 09/30/21 0423 10/01/21 0532 10/02/21 0449 10/02/21 0748  WBC 13.6*  --  15.4* 17.4* 17.6* 24.6*  --   CREATININE 3.96* 4.68* 3.63* 4.38*  4.38* 3.87*  --  3.18*     Estimated Creatinine Clearance: 26.8 mL/min (A) (by C-G formula based on SCr of 3.18 mg/dL (H)).    Allergies  Allergen Reactions   Contrast Media [Iodinated Diagnostic Agents] Anaphylaxis, Hives, Swelling and Other (See Comments)    Dye for cardiac cath. Tongue swells   Pneumococcal Vaccines Swelling and Other (See Comments)    Turns skin black, and bodily swelling   Vancomycin Nausea And Vomiting and Other (See Comments)    Infusion "made me feel like I was dying" had to be readmitted to hospital    Antimicrobials this admission: Vancomycin 10/3 >>  Cefepime 10/3 >>  Metronidazole 10/3 >> 10/9  Microbiology results: 9/29 bcx: negative  10/3 bcx: negative  10/13 bcx: 1/4 GNR  Thank you for allowing pharmacy to participate in this patient's care.  Levonne Spiller, PharmD PGY1 Acute Care Resident   10/02/2021,10:05 AM  ________________________________ Addendum:  Will discontinue vancomycin with 10/13 Bcx GNR. Will follow for LOT with cefepime and de-escalation as appropriate.   Thank you for allowing pharmacy to participate in this patient's care.  Levonne Spiller, PharmD PGY1 Acute Care Resident  10/02/2021,10:11 AM

## 2021-10-02 NOTE — Progress Notes (Signed)
eLink Physician-Brief Progress Note Patient Name: [name] DOB: [birth year] MRN: [id number]   Date of Service  10/02/2021  HPI/Events of Note  Pt noted to have an ooze of brbpr with no change in Hgb or hemodynamics  eICU Interventions  Continue hep gtt  Repeat Hgb ordered for 1000hrs  Monitor to see if bleeding worsens or if hemodynamics change     Intervention Category Minor Interventions: OtherTilden Dome 10/02/2021, 6:37 AM

## 2021-10-02 NOTE — Progress Notes (Addendum)
ANTICOAGULATION CONSULT NOTE - Follow Up Consult  Pharmacy Consult for IV Heparin Indication: atrial fibrillation  Allergies  Allergen Reactions   Contrast Media [Iodinated Diagnostic Agents] Anaphylaxis, Hives, Swelling and Other (See Comments)    Dye for cardiac cath. Tongue swells   Pneumococcal Vaccines Swelling and Other (See Comments)    Turns skin black, and bodily swelling   Vancomycin Nausea And Vomiting and Other (See Comments)    Infusion "made me feel like I was dying" had to be readmitted to hospital    Patient Measurements: Height: _0  (165.1 cm) Weight: 126.7 kg (279 lb 5.2 oz) IBW/kg (Calculated) : 57 Heparin Dosing Weight: 88 kg  Vital Signs: Temp: 97.7 F (36.5 C) (10/14 0747) Temp Source: Oral (10/14 0747) BP: 89/63 (10/14 0900) Pulse Rate: 74 (10/14 0900)  Labs: Recent Labs    09/29/21 1126 09/30/21 0423 09/30/21 0423 10/01/21 0532 10/01/21 1553 10/02/21 0100 10/02/21 0449 10/02/21 0748  HGB  --  9.4*   < > 9.3*  --   --  9.3*  --   HCT  --  29.7*  --  29.4*  --   --  28.4*  --   PLT  --  243  --  254  --   --  213  --   APTT  --   --   --  127* 70* 81*  --   --   LABPROT 26.5*  --   --   --   --   --   --   --   INR 2.4*  --   --   --   --   --   --   --   HEPARINUNFRC  --   --   --  >1.10*  --  >1.10*  --   --   CREATININE  --  4.38*  4.38*  --  3.87*  --   --   --  3.18*   < > = values in this interval not displayed.    Estimated Creatinine Clearance: 26.8 mL/min (A) (by C-G formula based on SCr of 3.18 mg/dL (H)).  Medical History: Past Medical History:  Diagnosis Date   Allergy    Phreesia 11/04/2020   Anemia, unspecified    Anxiety    Arthritis    [employer]reesia 11/04/2020   Asthma    as a child   Cancer ([place])    thyroid   Cellulitis    CHF (congestive heart failure) ([place])    Chiari malformation    s/p surgery   Chiari malformation    Chronic kidney disease    [employer]reesia 11/04/2020   Chronic kidney disease, stage 5 (HCC)     Chronic obstructive pulmonary disease, unspecified ([place])    Chronic pain    Complication of anesthesia 11/28/15   Resp arrest after  conscious  sedation   Compression of brain (HCC)    Constipation    COPD (chronic obstructive pulmonary disease) ([place])    Phreesia 11/04/2020   Coronary artery disease    40-50% mid LAD 04/29/09, Medical tx. (Dr. Gwenlyn Found)   Diabetes mellitus    Type II- reports being off all medication d/t it being controlled   Diabetes mellitus without complication ([place])    Phreesia 11/04/2020   Essential (primary) hypertension    Fibromyalgia    Fibromyalgia    History of blood transfusion    hemorrage duinrg pregancy   Hx of echocardiogram 10/2011   EF 55-60%   Hypercholesterolemia  Hyperlipidemia, unspecified    Hypertension    Hypertensive chronic kidney disease with stage 1 through stage 4 chronic kidney disease, or unspecified chronic kidney disease    Hypertensive chronic kidney disease with stage 5 chronic kidney disease or end stage renal disease (HCC)    Lymph edema    Lymphedema, not elsewhere classified    Morbid (severe) obesity due to excess calories (HCC)    Obesity hypoventilation syndrome ([place])    On home oxygen therapy    "2L; 24/7" (12/21/2018)   Peritonitis, unspecified ([place])    Pneumonia    in past   Pulmonary hypertension ([place])    Renal disorder    M/W/F [facility] Pt started dialysis in Dec.2016   S/P colonoscopy 05/26/2007   Dr. Laural Golden sigmoid diverticulosis random biopsies benign   S/P endoscopy 05/01/2009   Dr. Penelope Coop pill-induced esophageal ulcerations distal to midesophagus, 2 small ulcers in the antrum of the stomach   Shortness of breath dyspnea    with any exertion or if heart rate  is irregular while on dialysis   Sleep apnea    reports that she no longer needs CPAP due to weight loss   Sleep apnea, unspecified    Type 2 diabetes mellitus with diabetic nephropathy ([place])    Type 2 diabetes mellitus with hyperglycemia  [facility])     Assessment: 55 yr old female admitted on 09/17/2021 with worsening L thigh wound, with PMH of ESRD on HD MWF and Afib. Patient was transferred from [facility] to [facility] for surgical consult and possible need for CRRT. Patient was on apixaban PTA for Afib and continued while inpatient (LD 10/12 at ~0930). Pharmacy was consulted to dose IV heparin for Afib while apixaban on hold for possible procedure. No plans for surgery at this time.  aPTT 81, therapeutic Given recent apixaban exposure, will monitor anticoagulation using aPTT until aPTT and heparin levels correlate.  No issues noted with infusion. No bleeding noted.  Goal of Therapy:  Heparin level 0.3-0.7 units/ml aPTT 66-102 seconds Monitor platelets by anticoagulation protocol: Yes   Plan:  Continue heparin infusion at 1100 units/hr Monitor daily aPTT, heparin level, CBC Monitor for bleeding F/u plans to switch back to apixaban   Thank you for allowing pharmacy to participate in this patient's care.  Levonne Spiller, PharmD PGY1 Acute Care Resident  10/02/2021,9:44 AM   _____________________________________  Addendum:   Stop heparin IV and transition to PTA apixaban    Thank you for allowing pharmacy to participate in this patient's care.  Levonne Spiller, PharmD PGY1 Acute Care Resident  10/02/2021,9:52 AM

## 2021-10-03 DIAGNOSIS — Z7189 Other specified counseling: Secondary | ICD-10-CM | POA: Diagnosis not present

## 2021-10-03 DIAGNOSIS — R579 Shock, unspecified: Secondary | ICD-10-CM | POA: Diagnosis not present

## 2021-10-03 DIAGNOSIS — Z515 Encounter for palliative care: Secondary | ICD-10-CM | POA: Diagnosis not present

## 2021-10-03 LAB — RENAL FUNCTION PANEL
Albumin: 3.4 g/dL — ABNORMAL LOW (ref 3.5–5.0)
Anion gap: 14 (ref 5–15)
BUN: 69 mg/dL — ABNORMAL HIGH (ref 6–20)
CO2: 22 mmol/L (ref 22–32)
Calcium: 9.3 mg/dL (ref 8.9–10.3)
Chloride: 93 mmol/L — ABNORMAL LOW (ref 98–111)
Creatinine, Ser: 3.69 mg/dL — ABNORMAL HIGH (ref 0.44–1.00)
GFR, Estimated: 14 mL/min — ABNORMAL LOW (ref >60)
Glucose, Bld: 125 mg/dL — ABNORMAL HIGH (ref 70–99)
Phosphorus: 3.8 mg/dL (ref 2.5–4.6)
Potassium: 4.8 mmol/L (ref 3.5–5.1)
Sodium: 129 mmol/L — ABNORMAL LOW (ref 135–145)

## 2021-10-03 LAB — BASIC METABOLIC PANEL
Anion gap: 18 — ABNORMAL HIGH (ref 5–15)
BUN: 79 mg/dL — ABNORMAL HIGH (ref 6–20)
CO2: 19 mmol/L — ABNORMAL LOW (ref 22–32)
Calcium: 9.5 mg/dL (ref 8.9–10.3)
Chloride: 93 mmol/L — ABNORMAL LOW (ref 98–111)
Creatinine, Ser: 3.95 mg/dL — ABNORMAL HIGH (ref 0.44–1.00)
GFR, Estimated: 13 mL/min — ABNORMAL LOW (ref >60)
Glucose, Bld: 129 mg/dL — ABNORMAL HIGH (ref 70–99)
Potassium: 5.1 mmol/L (ref 3.5–5.1)
Sodium: 130 mmol/L — ABNORMAL LOW (ref 135–145)

## 2021-10-03 LAB — CBC
HCT: 29.2 % — ABNORMAL LOW (ref 36.0–46.0)
HCT: 30.8 % — ABNORMAL LOW (ref 36.0–46.0)
Hemoglobin: 9.4 g/dL — ABNORMAL LOW (ref 12.0–15.0)
Hemoglobin: 10.2 g/dL — ABNORMAL LOW (ref 12.0–15.0)
MCH: 30.8 pg (ref 26.0–34.0)
MCH: 31.2 pg (ref 26.0–34.0)
MCHC: 32.2 g/dL (ref 30.0–36.0)
MCHC: 33.1 g/dL (ref 30.0–36.0)
MCV: 95.7 fL (ref 80.0–100.0)
MCV: 94.2 fL (ref 80.0–100.0)
Platelets: 195 10*3/uL (ref 150–400)
Platelets: 175 10*3/uL (ref 150–400)
RBC: 3.05 MIL/uL — ABNORMAL LOW (ref 3.87–5.11)
RBC: 3.27 MIL/uL — ABNORMAL LOW (ref 3.87–5.11)
RDW: 24.5 % — ABNORMAL HIGH (ref 11.5–15.5)
RDW: 24.5 % — ABNORMAL HIGH (ref 11.5–15.5)
WBC: 31.7 10*3/uL — ABNORMAL HIGH (ref 4.0–10.5)
WBC: 24.2 10*3/uL — ABNORMAL HIGH (ref 4.0–10.5)
nRBC: 0.4 % — ABNORMAL HIGH (ref 0.0–0.2)
nRBC: 0.4 % — ABNORMAL HIGH (ref 0.0–0.2)

## 2021-10-03 LAB — HEPARIN LEVEL (UNFRACTIONATED)
Heparin Unfractionated: >1.1 IU/mL — ABNORMAL HIGH (ref 0.30–0.70)
Heparin Unfractionated: >1.1 IU/mL — ABNORMAL HIGH (ref 0.30–0.70)

## 2021-10-03 LAB — ZINC: Zinc: 85 ug/dL (ref 44–115)

## 2021-10-03 LAB — GLUCOSE, CAPILLARY
Glucose-Capillary: 161 mg/dL — ABNORMAL HIGH (ref 70–99)
Glucose-Capillary: 127 mg/dL — ABNORMAL HIGH (ref 70–99)
Glucose-Capillary: 180 mg/dL — ABNORMAL HIGH (ref 70–99)
Glucose-Capillary: 172 mg/dL — ABNORMAL HIGH (ref 70–99)
Glucose-Capillary: 187 mg/dL — ABNORMAL HIGH (ref 70–99)
Glucose-Capillary: 169 mg/dL — ABNORMAL HIGH (ref 70–99)

## 2021-10-03 LAB — APTT
aPTT: 52 seconds — ABNORMAL HIGH (ref 24–36)
aPTT: 47 seconds — ABNORMAL HIGH (ref 24–36)

## 2021-10-03 MED ORDER — LORAZEPAM 2 MG/ML PO CONC: 1.0000 mg | ORAL | 0 refills | Status: DC | PRN

## 2021-10-03 MED ORDER — LIDOCAINE HCL (PF) 1 % IJ SOLN: 5.0000 mL | INTRAMUSCULAR | Status: DC | PRN

## 2021-10-03 MED ORDER — SODIUM CHLORIDE 0.9 % IV SOLN: 100.0000 mL | INTRAVENOUS | Status: DC | PRN

## 2021-10-03 MED ORDER — LIDOCAINE-PRILOCAINE 2.5-2.5 % EX CREA: 1.0000 "application " | TOPICAL_CREAM | CUTANEOUS | Status: DC | PRN

## 2021-10-03 MED ORDER — CHLORHEXIDINE GLUCONATE CLOTH 2 % EX PADS
6.0000 | MEDICATED_PAD | Freq: Every day | CUTANEOUS | Status: DC
  Administered 2021-10-03: 6 via TOPICAL

## 2021-10-03 MED ORDER — MORPHINE SULFATE 20 MG/5ML PO SOLN: ORAL | 0 refills | Status: DC

## 2021-10-03 MED ORDER — ONDANSETRON HCL 4 MG/5ML PO SOLN: 4.0000 mg | Freq: Three times a day (TID) | ORAL | 0 refills | Status: DC | PRN

## 2021-10-03 MED ORDER — MIDODRINE HCL 5 MG PO TABS: 15.0000 mg | ORAL_TABLET | Freq: Three times a day (TID) | ORAL | 0 refills | Status: AC

## 2021-10-03 MED ORDER — BISACODYL 10 MG RE SUPP: 10.0000 mg | RECTAL | 0 refills | Status: DC | PRN

## 2021-10-03 MED ORDER — HEPARIN SODIUM (PORCINE) 1000 UNIT/ML DIALYSIS: 1000.0000 [IU] | INTRAMUSCULAR | Status: DC | PRN

## 2021-10-03 MED ORDER — ALTEPLASE 2 MG IJ SOLR: 2.0000 mg | Freq: Once | INTRAMUSCULAR | Status: DC | PRN

## 2021-10-03 MED ORDER — PENTAFLUOROPROP-TETRAFLUOROETH EX AERO: 1.0000 "application " | INHALATION_SPRAY | CUTANEOUS | Status: DC | PRN

## 2021-10-03 NOTE — Progress Notes (Signed)
ANTICOAGULATION CONSULT NOTE - Follow Up Consult  Pharmacy Consult for Heparin Indication: atrial fibrillation  Allergies  Allergen Reactions   Contrast Media [Iodinated Diagnostic Agents] Anaphylaxis, Hives, Swelling and Other (See Comments)    Dye for cardiac cath. Tongue swells   Pneumococcal Vaccines Swelling and Other (See Comments)    Turns skin black, and bodily swelling   Vancomycin Nausea And Vomiting and Other (See Comments)    Infusion "made me feel like I was dying" had to be readmitted to hospital    Patient Measurements: Height: 5' 5" (165.1 cm) Weight: 125.5 kg (276 lb 10.8 oz) IBW/kg (Calculated) : 57 Heparin Dosing Weight:    Vital Signs: Temp: 98 F (36.7 C) (10/15 0736) Temp Source: Axillary (10/15 0736) BP: 118/83 (10/15 0800) Pulse Rate: 94 (10/15 0800)  Labs: Recent Labs    10/01/21 0532 10/01/21 1553 10/02/21 0100 10/02/21 0449 10/02/21 0748 10/02/21 1033 10/03/21 0123 10/03/21 0130 10/03/21 0633 10/03/21 0832  HGB 9.3*  --   --  9.3*  --  9.4*  --  9.4*  --  10.2*  HCT 29.4*  --   --  28.4*  --  29.9*  --  29.2*  --  30.8*  PLT 254  --   --  213  --   --   --  195  --  175  APTT 127* 70* 81*  --   --   --   --  52*  --   --   HEPARINUNFRC >1.10*  --  >1.10*  --   --   --  >1.10*  --   --  >1.10*  CREATININE 3.87*  --   --   --  3.18*  --   --   --  3.69*  --      Estimated Creatinine Clearance: 23 mL/min (A) (by C-G formula based on SCr of 3.69 mg/dL (H)).  Assessment: Anticoag: apixaban 5 mg BID for afib (LD 10/12 AM).  - IV heparin 10/12-10/14,   - 10/14 Eliquis x 1 dose - 10/14 PM resume heparin for afib. (Had one dose of Eliquis this AM). (hold apixaban given need for dbridement)   aPTT 47, Heparin level >1.10 Labs drawn in same arm as heparin infusing, heparin held for 5 minutes prior to lab draw No s/s of bleeding noted  Heparin infusion at 1100 units/hr  Goal of Therapy:  Heparin level 0.3-0.7 units/ml aPTT  66-102 Monitor platelets by anticoagulation protocol: Yes   Plan:  Increase Heparin IV 1350 units/hr Recheck aPTT in 6 hours Will continue to trend heparin level and aPTT until correlation  Thank you for allowing pharmacy to participate in this patient's care.  Levonne Spiller, PharmD PGY1 Acute Care Resident  10/03/2021,9:25 AM

## 2021-10-03 NOTE — TOC Progression Note (Addendum)
Transition of Care [facility]) - Progression Note    Patient Details  Name: [name] MRN: [id number] Date of Birth: 03-Feb-1966  Transition of Care [facility]) CM/SW [place], [place] Phone Number: 10/03/2021, 1:04 PM  Clinical Narrative:    CSW spoke with pt and daughter at bedside. They confirmed they would like for pt to return home with hospice support. CSW provided choice document and pt and family chose hospice of [place]. CSW confirmed that pt has a hospital bed and other necessary equipment, they just need oxygen. Pt will need transportation home via non emergency ambulance. CSW spoke with hospice liaison and they will attempt to have everything ordered and delivered for pt to return home this evening. CSW will continue to follow for DC needs.  [facility] has confirmed they will likely have all equipment to the house this evening. Hospice will do intake appt in AM. Husband wants pt transported home as soon as equipment is delivered, even if it is late. CSW and NP have transport paperwork on the chart, nurse will be updated. TOC will continue to follow.   After hours PTAR # [phone] opt 1 then opt 3  Expected Discharge Plan: [place] Barriers to Discharge: Continued Medical Work up  Expected Discharge Plan and Services Expected Discharge Plan: [place] In-house Referral: Clinical Social Work Discharge Planning Services: CM Consult Post Acute Care Choice: [place] arrangements for the past 2 months: Single Family Home                                       Social Determinants of Health (SDOH) Interventions    Readmission Risk Interventions Readmission Risk Prevention Plan 09/18/2021 04/30/2019  Transportation Screening Complete Complete  Medication Review Press photographer) Complete Complete  PCP or Specialist appointment within 3-5 days of discharge - Complete  HRI or [employer] Complete  Complete  SW Recovery Care/Counseling Consult Complete Complete  Palliative Care Screening Not Applicable Not [place] Complete Not Applicable  Some recent data might be hidden

## 2021-10-03 NOTE — Progress Notes (Signed)
OT Cancellation Note  Patient Details Name: EILISH MCDANIEL MRN: [id number] DOB: 1966-06-01   Cancelled Treatment:    Reason Eval/Treat Not Completed: OT screened, no needs identified, will sign off  Metta Clines 10/03/2021, 12:56 PM 10/03/2021 10/03/2021  RP, OTR/L  Acute Rehabilitation Services  Office:  [phone]

## 2021-10-03 NOTE — Progress Notes (Signed)
PT Cancellation Note  Patient Details Name: [name] MRN: [id number] DOB: 10-27-1966   Cancelled Treatment:    Reason Eval/Treat Not Completed: PT screened, no needs identified, will sign off (pt with necessary DME at home, uses hospital bed and hoyer with plans for home with hospice. No acute needs at this time)   [name] 10/03/2021, 12:33 PM Bayard Males, PT Acute Rehabilitation Services Pager: [phone] Office: [phone]

## 2021-10-03 NOTE — Progress Notes (Addendum)
Called to room by patient who informed me that she no longer wanted to continue with hospital care and that she wishes to go home. CCM provider notified and came to bedside to speak with patient. Patient's daughter and son at bedside at the time of discussion during which patient stated again multiple times that she did not want to continue receiving care and just wants to go home. Patient refusing to receive dialysis scheduled for today. Dr. Carolin Sicks with nephrology notified of patient's wishes. Palliative care consulted by CCM provider to help coordinate best options available for patient.

## 2021-10-03 NOTE — Consult Note (Signed)
Palliative Medicine Inpatient Consult Note  Consulting Provider: Maryjane Hurter, MD  Reason for consult:   [place] Palliative Medicine Consult  Reason for Consult? ESRD, severe PH, PsA bacteremia, chronic painful wound, pt wants to get home with hospice if at all possible   HPI:  Per intake H&P --> 55 year old female with medical history of infected left thigh wound hospitalized from 7/3-9/5, required incision and drainage, plastic surgery consultation and wound VAC.  Hospital course was complicated by septic shock and heart failure, essential hypertension, ESRD on HD MWF, chronic diastolic heart failure, COPD, diabetes mellitus type 2, chronic lymphedema, morbid obesity who presented to [facility] ED on 9/29 with worsening left eye infection, generalized weakness and fatigue in the setting of missed dialysis session.    Shivali has been hospitalized for the past 2 weeks.  She has had a complicated hospitalization inclusive of infected left thigh wound leading to septic shock and care in the intensive care unit.  Keyri shares with her family today that she is ready to transition care to more of a comfort emphasis and concedes to stopping dialysis care and being comfortable in her home.  Palliative care was consulted to help with coordination of care to transition her home with hospice.  Palliative care is familiar with [place] as she had been seen by my colleague Vinie Sill in July and August of this year.  Clinical Assessment/Goals of Care:  *Please note that this is a verbal dictation therefore any spelling or grammatical errors are due to the "[employer] One" system interpretation.  I have reviewed medical records including EPIC notes, labs and imaging, received report from bedside RN, assessed the patient who is lying in bed in no acute distress.    I met with Enrigue Catena, her daughter, Alesia Richards, her son, Deatra Canter and her spouse, Tim via speakerphone to further  discuss diagnosis prognosis, GOC, EOL wishes, disposition and options.  We reviewed that [name] has been through a tremendous amount over the past few months inclusive of her prolonged hospitalization due to a left thigh wound which was complicated by septic shock.  We reviewed her history of end-stage renal disease requiring consistent hemodialysis.   I introduced Palliative Medicine as specialized medical care for people living with serious illness. It focuses on providing relief from the symptoms and stress of a serious illness. The goal is to improve quality of life for both the patient and the family.   Charlesia lives in [place], [place]. She notes that she has very good support with her husband, son, daughter, grandchildren (she is raising her 61 yo granddaughter), and she is the youngest of eleven siblings.  Ellana is a woman of faith and practices within the [employer] denomination.  A detailed discussion was had today regarding advanced directives, these are present on file in [place].    Concepts specific to code status, artifical feeding and hydration, continued IV antibiotics and rehospitalization was had.  Marcea shares that she is at a point where she just wants to be comfortable.  We reviewed that this means she would stop dialysis and aggressive modalities of care such as pressor support.  We reviewed that her time on earth will be very limited which Jadah understands.  Agatha shares that her goal is to get home.  I introduced the topic of hospice. I described hospice as a service for patients for have a life expectancy of < 6 months. It preserves dignity and quality at the end phases  of life. The focus changes from curative to symptom relief.  Shekinah asked how soon she can leave and I shared that we will try to expedite her discharge.  Her daughter expresses that they have all DME at home with the exception of oxygen.  Her daughter shares that she does need to organize and arrange  the home though after 6 PM tonight [place] can transition.  I reached out to the transitions of care team to help with coordination of discharge.  Our social worker, Janett Billow plans to meet with Malachy Mood and help get the ball rolling to transition her home on hospice ideally this evening.  Decision Maker: Patient can presently make decisions for herself.  SUMMARY OF RECOMMENDATIONS   DNAR/DNI  Stop hemodialysis treatments  Will maintain on present supportive measures until the time of discharge at which time intravenous medications will be stopped  Plan to transition home with hospice ideally as early as [place] transitions of care team helping to expedite discharge  Ongoing support by the palliative care team  Code Status/Advance Care Planning: DNAR/DNI   Palliative Prophylaxis:  Oral care, turn every 2, pain management  Additional Recommendations (Limitations, Scope, Preferences): Patient will transition to comfort focused care upon discharge   Psycho-social/Spiritual:  Desire for further Chaplaincy support: Yes Additional Recommendations: Education on acute on chronic disease process   Prognosis: Limited 7 to 10 days  Discharge Planning: Discharge to home with hospice  Vitals:   10/03/21 1000 10/03/21 1030  BP: (!) 100/47 110/81  Pulse: 91 98  Resp: 14 15  Temp:    SpO2: 96% 94%    Intake/Output Summary (Last 24 hours) at 10/03/2021 1201 Last data filed at 10/03/2021 1000 Gross per 24 hour  Intake 1064.88 ml  Output --  Net 1064.88 ml   Last Weight  Most recent update: 10/03/2021  5:50 AM    Weight  125.5 kg (276 lb 10.8 oz)            Gen: Exceptionally ill-appearing African-American female HEENT: moist mucous membranes CV: Regular rate and rhythm  PULM: Nasal cannula in place ABD: soft/nontender EXT: Anasarca Neuro: Alert and oriented x3  PPS: 20%   This conversation/these recommendations were discussed with patient primary care  team, Dr. Verlee Monte  Time In: 1107 Time Out: 1217 Total Time: 70 Greater than 50%  of this time was spent counseling and coordinating care related to the above assessment and plan.  [place] Team Team Cell Phone: [phone] Please utilize secure chat with additional questions, if there is no response within 30 minutes please call the above phone number  Palliative Medicine Team providers are available by phone from 7am to 7pm daily and can be reached through the team cell phone.  Should this patient require assistance outside of these hours, please call the patient's attending physician.

## 2021-10-03 NOTE — Progress Notes (Signed)
NAME:  [name], MRN:  [id number], DOB:  [birth year], LOS: 70 ADMISSION DATE:  09/17/2021, CONSULTATION DATE:  10/12 REFERRING MD:  shahmehdi, CHIEF COMPLAINT:  wound eval and sepsis    History of Present Illness:  55 year old female with multiple medical problems.  Admitted from home [facility] to [facility] with chief complaint of worsening left thigh wound.  Discharged 9/5 after prolonged hospital stay for infected left thigh wound with wound VAC, which she refused. Actually presented to the ER 9/9 at advice of wound care nurse for concern about infection.  Was discharged with recommendations to continue current wound care.  She was supposed to see plastic surgery on 9/27 however presented to the ER once again at the recommendation of her wound care nurse for evaluation of left thigh wound with concern about worsening infection.  In ER she was noted to be negative for fever chills shortness of breath.  She had missed 2 doses of dialysis prior to presentation.  Plastic surgery was consulted in the ER and recommended removing wound VAC, wound this was removed she had superficial venous bleeding due to rupture of superficial vein which was controlled with direct pressure, sutures, and compression wrap.  Vascular surgery was consulted and recommended packing the wound and applying compression wrap.  She was initially admitted for evaluation and monitoring of this wound. Hospital course 9/29 through 10/12: Admitted.  Nephrology consulted and assisted with dialysis.  Volume status managed through dialysis with volume removal.  Wound managed with Xeroform dressings changed daily initially.  Initial hospital course complicated significantly by difficult to control electrolyte disturbances. 10/3: White blood cell increased from 9.9-29.1, new acute metabolic encephalopathy, new SIRS/sepsis which was not present on admission.  She was hypotensive.  Transferred to the intensive care and  started on norepinephrine.  Blood cultures were sent. Vanc and cefepime started. WOC re-consulted: no change in rx advised. 10/4 right femoral CVL placed by surgery. 10/5 added hydrocortisone for stress dose. Still pressor dependent but mental status better. Midodrine increased 10/6, still pressor dependent so midodrine increased to 78m tid, 10/11 off pressors. 10/12 back on pressors. Concern for on-going wound infection. Surgery consulted to evaluate on [facility]. [facility] for surgical evaluation and possible need for CRRT>  Pertinent  Medical History   Infected left thigh wound hospitalized from 7/3 through 9/5 (64 days) required incision and drainage, plastic surgery consultation and wound VAC.  Hospital course complicated by septic shock and heart failure .  HTN, ESRD (MWF), HFpEF/ chronic, COPD, type II dm, chronic Lymphedema of lower extremities,  MO (class III BMI 49.9)  HLD, atrial fib, anxiety, Chronic pain, Prior respiratory arrest after anesthesia at age 55 CAD last cath 2010. OSA  Significant Hospital Events: Including procedures, antibiotic start and stop dates in addition to other pertinent events   9/29 admitted w/ concern for infected left thigh concern. Not clearly infected BUT did have bleeding on removal of wound VAC req 'd pressure and suture. Plastic surg recommended dc wound vac and daily change 10/4 developed shock and encephalopathy. CT thigh. Soft tissue wound along the anteromedial distal thigh with surrounding skin thickening and underlying subcutaneous edema. No fluid collection or subcutaneous emphysema.Started on vanc and cefepime. Right femoral CVL placed.  10/5 hydrocortisone started 10/6, still pressor dependent so midodrine increased to 243mtid 10/11 off pressors 10/12 back on pressors. [facility] for surgical eval and possible need for CRRT  [facility] but hypotension refractory to repeated midodrine dosing, albumin  prompted PCCM reinvolvement  Interim History / Subjective:  Central line removed.    Renal to attempt HD via AVF with low dose peripheral levo support in effort to continue line holiday in setting of her PsA bacteremia.   Objective   Blood pressure 118/83, pulse 94, temperature 98 F (36.7 C), temperature source Axillary, resp. rate 19, height _0  (1.651 m), weight 125.5 kg, SpO2 99 %.        Intake/Output Summary (Last 24 hours) at 10/03/2021 0904 Last data filed at 10/03/2021 0800 Gross per 24 hour  Intake 1668.54 ml  Output --  Net 1668.54 ml   Filed Weights   10/01/21 1838 10/02/21 0500 10/03/21 0500  Weight: 126.6 kg 126.7 kg 125.5 kg    Examination: General appearance: 55 y.o., female, NAD, chronically ill appearing, a little withdrawn Eyes: anicteric sclerae, tracking HENT: NCAT;  MMM Lungs: diminished b/l, no wheeze, with normal respiratory effort CV: RRR no murmur Abdomen: Soft, non-tender; non-distended, hypoactive BS Extremities: 1+ BLE edema, dressing in place over left thigh wound, BLE Neuro: Alert and oriented to person and place, no focal deficit   Mild hyponatremia Stable leukocytosis   Resolved Hospital Problem list     Assessment & Plan:  Shock  Sepsis with PsA bacteremia and ?ecthyma. Her [place] may additionally be contributing to her intolerance of HD sessions. - central line removed 10/14 - merrem 10/14- - continue stress dose steroids - wean levo for MAP 65 - reinvolve plastics/surgery in AM given BCID panel showing pseudomonas perhaps greater concern wound infected  Chronic left medial thigh wound. Plan - dressings per WOCN, plastics - hold DOAC in case needs debridement  chronic diastolic heart failure -Has not had a portable chest x-ray since 10/3, is on minimal supplemental oxygen Plan -volume removal as tolerated with HD vs CRRT  History of paroxysmal atrial fibrillation Plan  - Continue heparin gtt and amio - if no need for  vascath or wound debridement resume home DOAC  End-stage renal disease, typically on dialysis Monday/Wednesday/Friday.  Having difficulty tolerating intermittent dialysis with ongoing hypotension, and volume overload Plan -appreciate nephrology - to trial HD via AVF with low dose levo support to facilitate having line holiday in setting of bacteremia -may need vascath/CRRT tomorrow, pt agreeable to this if needed  Fluid electrolyte imbalance in the setting of end-stage renal disease Plan Continue to monitor Correct as indicated  Anemia of end-stage renal disease without evidence of bleeding Plan Trend CBC Transfuse for Hb 7  Type 2 diabetes Plan SSI     Best Practice (right click and "Reselect all SmartList Selections" daily)   Diet/type: Regular consistency (see orders) DVT prophylaxis: systemic heparin GI prophylaxis: PPI Lines: Central line and No longer needed.  Order written to d/c  Foley:  N/A Code Status:  full code Last date of multidisciplinary goals of care discussion [with pt 10/15 ]  Labs   CBC: Recent Labs  Lab 09/30/21 0423 10/01/21 0532 10/02/21 0449 10/02/21 1033 10/03/21 0130 10/03/21 0832  WBC 17.4* 17.6* 24.6*  --  31.7* 24.2*  HGB 9.4* 9.3* 9.3* 9.4* 9.4* 10.2*  HCT 29.7* 29.4* 28.4* 29.9* 29.2* 30.8*  MCV 96.7 96.7 95.3  --  95.7 94.2  PLT 243 254 213  --  195 151    Basic Metabolic Panel: Recent Labs  Lab 09/27/21 0511 09/28/21 0430 09/29/21 0431 09/30/21 0423 10/01/21 0532 10/02/21 0748 10/03/21 0633  NA 131*  130* 132* 130*  131* 131* 132* 129*  K 3.5 3.9 4.1 4.6  4.6 4.6 4.0 4.8  CL 92* 91* 93* 93*  93* 92* 94* 93*  CO2 _0 GLUCOSE 171* 159* 192* 141*  140* 151* 157* 125*  BUN 52* 70* 50* 72*  71* 56* 46* 69*  CREATININE 3.96* 4.68* 3.63* 4.38*  4.38* 3.87* 3.18* 3.69*  CALCIUM 9.1 9.0 8.9 9.1  9.1 9.3 9.2 9.3  MG 2.1 2.1 2.0 2.1  --   --   --   PHOS 4.3 4.6 3.8 4.3 4.5 3.9 3.8    GFR: Estimated Creatinine Clearance: 23 mL/min (A) (by C-G formula based on SCr of 3.69 mg/dL (H)). Recent Labs  Lab 10/01/21 0532 10/02/21 0449 10/03/21 0130 10/03/21 0832  WBC 17.6* 24.6* 31.7* 24.2*    Liver Function Tests: Recent Labs  Lab 09/29/21 0431 09/30/21 0423 10/01/21 0532 10/02/21 0748 10/03/21 0633  ALBUMIN 3.3* 3.2* 3.2* 3.1* 3.4*   No results for input(s): LIPASE, AMYLASE in the last 168 hours. No results for input(s): AMMONIA in the last 168 hours.  ABG    Component Value Date/Time   PHART 7.299 (L) 06/22/2021 1414   PCO2ART 47.5 06/22/2021 1414   PO2ART 89 06/22/2021 1414   HCO3 28.8 (H) 07/02/2021 1321   TCO2 31 07/02/2021 1321   ACIDBASEDEF 3.0 (H) 06/22/2021 1414   O2SAT 53.0 07/02/2021 1321     Coagulation Profile: Recent Labs  Lab 09/29/21 1126  INR 2.4*    Cardiac Enzymes: No results for input(s): CKTOTAL, CKMB, CKMBINDEX, TROPONINI in the last 168 hours.  HbA1C: Hgb A1c MFr Bld  Date/Time Value Ref Range Status  09/18/2021 06:20 AM 6.8 (H) 4.8 - 5.6 % Final    Comment:    (NOTE) Pre diabetes:          5.7%-6.4%  Diabetes:              >6.4%  Glycemic control for   <7.0% adults with diabetes   06/22/2021 04:54 AM 6.9 (H) 4.8 - 5.6 % Final    Comment:    (NOTE) Pre diabetes:          5.7%-6.4%  Diabetes:              >6.4%  Glycemic control for   <7.0% adults with diabetes     CBG: Recent Labs  Lab 10/02/21 1517 10/02/21 1936 10/02/21 2322 10/03/21 0350 10/03/21 0736  GLUCAP 194* 153* 131* 161* 127*   Critical care time: The patient is critically ill with multiple organ systems failure and requires high complexity decision making for assessment and support, frequent evaluation and titration of therapies, application of advanced monitoring technologies and extensive interpretation of multiple databases.  Critical care time 30 mins.     Fredirick Maudlin Pulmonary/Critical Care  10/03/2021, 9:04  AM See Amion for pager If no response to pager, please call [phone] until 1900 After 1900 please call [facility]

## 2021-10-03 NOTE — Progress Notes (Signed)
[facility] NEPHROLOGY PROGRESS NOTE  Assessment/ Plan:  # Left thigh wound with septic shock: Initially required pressor currently off.  She is on broad-spectrum antibiotics including vancomycin and cefepime.  Seen by plastic surgery who recommended no surgery-continue due to wound care.  #  End-stage renal disease: On hemodialysis on MWF schedule with episodes of intradialytic hypotension that have been limiting to ultrafiltration for wound healing/management.  Unable to do dialysis yesterday because of hypotension refractory to albumin and midodrine.  She is currently on  small dose of Levophed with acceptable blood pressure reading.  We will attempt to do intermittent HD today while on pressor support to maintain blood pressure.  I will lower dialysate temperature.  She does not want to place HD catheter.  Discussed with ICU team.    # Chronic hypotension/shock: Compounded by septic shock.currently on levo. Increase midodrine to 15 mg 3 times daily.  Monitor blood pressure.  #Anemia of chronic kidney disease: Compounded by ESA resistance from wound healing, continue ESA and transfuse with PRBCs when hemoglobin <7.  # Chronic lymphedema/chronic diastolic heart failure  #Secondary hyperparathyroidism: Currently on calcitriol and lanthanum.  Calcium phosphorus level acceptable.  #Chronic hyponatremia, hypervolemic: UF as tolerated during HD.  I recommend fluid restriction and avoid IV fluid.   Subjective: Seen and examined in ICU.  Event noted.  Currently on 2 mics of levo.  Mild short of breath but denies nausea vomiting chest pain.  Discussed with the ICU physician and the nurse.  Objective Vital signs in last 24 hours: Vitals:   10/03/21 0700 10/03/21 0730 10/03/21 0736 10/03/21 0800  BP: 110/78 (!) 119/92  118/83  Pulse: 89 98  94  Resp: _0 Temp:   98 F (36.7 C)   TempSrc:   Axillary   SpO2: 91% 93%  99%  Weight:      Height:       Weight change: -2.3  kg  Intake/Output Summary (Last 24 hours) at 10/03/2021 0834 Last data filed at 10/03/2021 0800 Gross per 24 hour  Intake 1679.56 ml  Output --  Net 1679.56 ml        Labs: Basic Metabolic Panel: Recent Labs  Lab 10/01/21 0532 10/02/21 0748 10/03/21 0633  NA 131* 132* 129*  K 4.6 4.0 4.8  CL 92* 94* 93*  CO2 _1 GLUCOSE 151* 157* 125*  BUN 56* 46* 69*  CREATININE 3.87* 3.18* 3.69*  CALCIUM 9.3 9.2 9.3  PHOS 4.5 3.9 3.8    Liver Function Tests: Recent Labs  Lab 10/01/21 0532 10/02/21 0748 10/03/21 0633  ALBUMIN 3.2* 3.1* 3.4*    No results for input(s): LIPASE, AMYLASE in the last 168 hours. No results for input(s): AMMONIA in the last 168 hours. CBC: Recent Labs  Lab 09/29/21 0431 09/30/21 0423 10/01/21 0532 10/02/21 0449 10/02/21 1033 10/03/21 0130  WBC 15.4* 17.4* 17.6* 24.6*  --  31.7*  HGB 9.5* 9.4* 9.3* 9.3* 9.4* 9.4*  HCT 29.5* 29.7* 29.4* 28.4* 29.9* 29.2*  MCV 97.4 96.7 96.7 95.3  --  95.7  PLT 254 243 254 213  --  195    Cardiac Enzymes: No results for input(s): CKTOTAL, CKMB, CKMBINDEX, TROPONINI in the last 168 hours. CBG: Recent Labs  Lab 10/02/21 1517 10/02/21 1936 10/02/21 2322 10/03/21 0350 10/03/21 0736  GLUCAP 194* 153* 131* 161* 127*     Iron Studies: No results for input(s): IRON, TIBC, TRANSFERRIN, FERRITIN in the last 72 hours. Studies/Results:  DG CHEST PORT 1 VIEW  Result Date: 10/02/2021 CLINICAL DATA:  Shock EXAM: PORTABLE CHEST 1 VIEW COMPARISON:  09/21/2021 FINDINGS: Stable cardiomegaly. Atherosclerotic calcification of the aortic knob. Rounded 1.6 cm radiodense structure projects within the mid chest just above the level of the carina. Mild pulmonary vascular congestion with streaky bibasilar interstitial opacities. No pleural effusion or pneumothorax. IMPRESSION: 1. Cardiomegaly with mild pulmonary vascular congestion and streaky bibasilar interstitial opacities, which may reflect mild edema versus  atypical/viral infection. 2. Rounded 1.6 cm radiodense structure projects within the mid chest. It is unclear whether this finding is external to the patient. The possibility of a foreign body within the esophagus or distal trachea is not excluded. Follow-up PA and lateral radiographs of the chest would be helpful for localization. Electronically Signed   By: Davina Poke D.O.   On: 10/02/2021 18:58    Medications: Infusions:  sodium chloride     sodium chloride     sodium chloride 250 mL (10/02/21 1029)   sodium chloride 10 mL/hr at 10/02/21 2000   heparin 1,100 Units/hr (10/03/21 0800)   meropenem (MERREM) IV Stopped (10/02/21 1821)   norepinephrine (LEVOPHED) Adult infusion 2 mcg/min (10/03/21 0800)    Scheduled Medications:  amiodarone  200 mg Oral Daily   calcitRIOL  0.5 mcg Oral Q M,W,F-HD   calcium acetate  1,334 mg Oral TID with meals   Chlorhexidine Gluconate Cloth  6 each Topical Q0600   Chlorhexidine Gluconate Cloth  6 each Topical Q0600   Chlorhexidine Gluconate Cloth  6 each Topical Q0600   cholecalciferol  1,000 Units Oral Daily   darbepoetin (ARANESP) injection - DIALYSIS  100 mcg Intravenous Q Fri-HD   docusate sodium  100 mg Oral BID   feeding supplement (NEPRO CARB STEADY)  237 mL Oral BID BM   Gerhardt's butt cream   Topical BID   hydrocortisone sod succinate (SOLU-CORTEF) inj  100 mg Intravenous Q8H   influenza vac split quadrivalent PF  0.5 mL Intramuscular Tomorrow-1000   insulin aspart  0-15 Units Subcutaneous Q4H   insulin detemir  5 Units Subcutaneous Daily   lanthanum  500 mg Oral TID WC   midodrine  15 mg Oral TID WC   multivitamin  1 tablet Oral QHS   nutrition supplement (JUVEN)  1 packet Oral BID BM   pantoprazole  40 mg Oral BID   sodium chloride flush  10-40 mL Intracatheter Q12H    have reviewed scheduled and prn medications.  Physical Exam: General:NAD, comfortable Heart:RRR, s1s2 nl Lungs: Coarse breath sound, no increased work of  breathing Abdomen:soft, Non-tender, non-distended Extremities: Dressing and boot applied to the leg. Dialysis Access: Left upper extremity AV fistula has good thrill and bruit.  Kemon Devincenzi Prasad Timmy Bubeck 10/03/2021,8:34 AM  LOS: 16 days

## 2021-10-03 NOTE — Plan of Care (Signed)
Received a message from the nurse that the patient would not like to continue dialysis. I went to the room and discussed with the patient in the presence of her daughter, son and RN.  Patient confirms with me that she does not like to continue dialysis.  She likes to go home with hospice.  The palliative team was already consulted by the ICU team.  The dialysis has been challenging to the patient because of hypotension and not tolerating well.  No further dialysis. Palliative care consult with hospice care. Sign off, please call me with question.  Discussed with ICU team Dr.Meier and RN.  D.Amayah Staheli, MD [facility].

## 2021-10-03 NOTE — Plan of Care (Signed)
  Problem: Education: Goal: Knowledge of General Education information will improve Description: Including pain rating scale, medication(s)/side effects and non-pharmacologic comfort measures Outcome: Progressing   Problem: Health Behavior/Discharge Planning: Goal: Ability to manage health-related needs will improve Outcome: Progressing   Problem: Clinical Measurements: Goal: Respiratory complications will improve Outcome: Progressing Goal: Cardiovascular complication will be avoided Outcome: Progressing   Problem: Activity: Goal: Risk for activity intolerance will decrease Outcome: Progressing   Problem: Nutrition: Goal: Adequate nutrition will be maintained Outcome: Progressing   Problem: Coping: Goal: Level of anxiety will decrease Outcome: Progressing   Problem: Elimination: Goal: Will not experience complications related to bowel motility Outcome: Progressing Goal: Will not experience complications related to urinary retention Outcome: Progressing   Problem: Pain Managment: Goal: General experience of comfort will improve Outcome: Progressing   Problem: Safety: Goal: Ability to remain free from injury will improve Outcome: Progressing   Problem: Skin Integrity: Goal: Risk for impaired skin integrity will decrease Outcome: Progressing

## 2021-10-03 NOTE — Progress Notes (Signed)
ANTICOAGULATION CONSULT NOTE - Follow Up Consult  Pharmacy Consult for heparin Indication: atrial fibrillation  Labs: Recent Labs    09/30/21 0423 09/30/21 0423 10/01/21 0532 10/01/21 1553 10/02/21 0100 10/02/21 0449 10/02/21 0748 10/02/21 1033 10/03/21 0130  HGB 9.4*  --  9.3*  --   --  9.3*  --  9.4* 9.4*  HCT 29.7*  --  29.4*  --   --  28.4*  --  29.9* 29.2*  PLT 243  --  254  --   --  213  --   --  195  APTT  --    < > 127* 70* 81*  --   --   --  52*  HEPARINUNFRC  --   --  >1.10*  --  >1.10*  --   --   --   --   CREATININE 4.38*  4.38*  --  3.87*  --   --   --  3.18*  --   --    < > = values in this interval not displayed.    Assessment/Plan:  55yo female subtherapeutic on heparin after resumed but was previously at goal at this rate and likely needs more time to accumulate. Will continue infusion at current rate of 1100 units/hr and check additional PTT.   Wynona Neat, PharmD, BCPS  10/03/2021,2:06 AM

## 2021-10-04 LAB — CULTURE, BLOOD (ROUTINE X 2): Special Requests: ADEQUATE

## 2021-10-04 NOTE — Progress Notes (Signed)
[facility] arrival. Family notified of ETA home. Pt's IVs removed. Pt alert upon DC and excited to go home.

## 2021-10-05 NOTE — Discharge Summary (Signed)
Physician Discharge Summary   Patient ID: [name] [id number] 55 y.o. Jan 19, 1966  Admit date: 09/17/2021  Discharge date and time: 10/04/2021  1:38 AM   Admitting Physician: Bernadette Hoit, DO   Discharge Physician: Walker Shadow  Admission Diagnoses: Dialysis patient, noncompliant ([place]) [Z91.15] Open wound of left thigh [S71.102A] Visit for wound check [Z51.89]  Discharge Diagnoses: Septic shock from pseudomonas bacteremia   Admission Condition: critical  Discharged Condition: serious  Indication for Admission: septic shock  Hospital Course:  55yF with ESRD and severe [place] iniitially admitted [facility] 9/[facility] for worsening left thigh wound with concern for infection. Wound care and plastic surgery followed. Her course was complicated by hypotension and then persistent shock prompting transfer to [facility] 10/12 with concern for SSTI of wounds. Shortly after admission, found to have pseudomonas bacteremia which was likely cause of septic shock. Given recent poor quality of life, anticipated ongoing difficulty with dialysis especially in setting of severe PH, pt elected to pursue comfort measures only after discussion with family, palliative care consulted to expedite discharge to home hospice.   Consults:  plastic surgery, palliative care  Significant Diagnostic Studies: BCx with PsA resistant to cefepime  Treatments: antibiotics: Meropenem  Discharge Exam: Documented in same day progress note  Disposition: Discharge disposition: 06-Home-Health Care Svc       Patient Instructions:  Allergies as of 10/04/2021       Reactions   Contrast Media [iodinated Diagnostic Agents] Anaphylaxis, Hives, Swelling, Other (See Comments)   Dye for cardiac cath. Tongue swells   Pneumococcal Vaccines Swelling, Other (See Comments)   Turns skin black, and bodily swelling   Vancomycin Nausea And Vomiting, Other (See Comments)   Infusion "made me feel like I was dying" had to be  readmitted to hospital        Medication List     STOP taking these medications    amiodarone 200 MG tablet Commonly known as: PACERONE   apixaban 5 MG Tabs tablet Commonly known as: ELIQUIS   calcium acetate 667 MG capsule Commonly known as: PHOSLO   Darbepoetin Alfa 25 MCG/0.42ML Sosy injection Commonly known as: ARANESP   lanthanum 500 MG chewable tablet Commonly known as: FOSRENOL   metoprolol tartrate 25 MG tablet Commonly known as: LOPRESSOR   multivitamin Tabs tablet   oxyCODONE 5 MG immediate release tablet Commonly known as: Roxicodone   Oxycodone HCl 10 MG Tabs   pantoprazole 40 MG tablet Commonly known as: PROTONIX       TAKE these medications    acetaminophen 325 MG tablet Commonly known as: TYLENOL Take 2 tablets (650 mg total) by mouth every 4 (four) hours as needed for mild pain (or Fever >/= 101).   bisacodyl 10 MG suppository Commonly known as: DULCOLAX Place 1 suppository (10 mg total) rectally as needed for moderate constipation.   Constulose 10 GM/15ML solution Generic drug: lactulose Take 20 g by mouth daily.   EX-LAX PO Take 2 tablets by mouth daily as needed (constipation).   loperamide 2 MG capsule Commonly known as: IMODIUM Take 2-4 mg by mouth as needed for diarrhea or loose stools.   LORazepam 2 MG/ML concentrated solution Commonly known as: ATIVAN Take 0.5 mLs (1 mg total) by mouth every 4 (four) hours as needed for anxiety.   midodrine 5 MG tablet Commonly known as: PROAMATINE Take 3 tablets (15 mg total) by mouth 3 (three) times daily with meals.   morphine 20 MG/5ML solution 2.18m to 5 mg every  three hours as needed for pain or shortness of breath   ondansetron 4 MG/5ML solution Commonly known as: Zofran Take 5 mLs (4 mg total) by mouth every 8 (eight) hours as needed for nausea or vomiting.       Activity: activity as tolerated Diet: regular diet Wound Care: none needed  Follow-up not  needed.  Signed: Maryjane Hurter 10/05/2021 10:49 AM

## 2021-10-06 ENCOUNTER — Telehealth: Payer: Self-pay | Admitting: *Deleted

## 2021-10-06 LAB — CULTURE, BLOOD (ROUTINE X 2): Culture: NO GROWTH

## 2021-10-06 LAB — VITAMIN C: Vitamin C: 0.4 mg/dL (ref 0.4–2.0)

## 2021-10-06 NOTE — Telephone Encounter (Signed)
Transition Care Management Unsuccessful Follow-up Telephone Call  Date of discharge and from where:  [facility] 10-04-21  Attempts:  1st Attempt  Reason for unsuccessful TCM follow-up call:  Left voice message

## 2021-10-20 DEATH — deceased

## 2021-11-02 ENCOUNTER — Encounter (HOSPITAL_COMMUNITY): Payer: Self-pay | Admitting: Radiology
